# Patient Record
Sex: Female | Born: 1975 | Race: White | Hispanic: No | Marital: Single | Smoking: Current every day smoker
Health system: Southern US, Community
[De-identification: ages and names within clinical notes are randomized; demographics above are authoritative.]

## PROBLEM LIST (undated history)

## (undated) DIAGNOSIS — N186 End stage renal disease: Secondary | ICD-10-CM

## (undated) DIAGNOSIS — C539 Malignant neoplasm of cervix uteri, unspecified: Secondary | ICD-10-CM

## (undated) DIAGNOSIS — I050 Rheumatic mitral stenosis: Secondary | ICD-10-CM

## (undated) DIAGNOSIS — T8859XA Other complications of anesthesia, initial encounter: Secondary | ICD-10-CM

## (undated) DIAGNOSIS — C801 Malignant (primary) neoplasm, unspecified: Secondary | ICD-10-CM

## (undated) DIAGNOSIS — F419 Anxiety disorder, unspecified: Secondary | ICD-10-CM

## (undated) DIAGNOSIS — R0600 Dyspnea, unspecified: Secondary | ICD-10-CM

## (undated) DIAGNOSIS — I10 Essential (primary) hypertension: Secondary | ICD-10-CM

## (undated) DIAGNOSIS — T401X1A Poisoning by heroin, accidental (unintentional), initial encounter: Secondary | ICD-10-CM

## (undated) DIAGNOSIS — D649 Anemia, unspecified: Secondary | ICD-10-CM

## (undated) DIAGNOSIS — F319 Bipolar disorder, unspecified: Secondary | ICD-10-CM

## (undated) DIAGNOSIS — E119 Type 2 diabetes mellitus without complications: Secondary | ICD-10-CM

## (undated) DIAGNOSIS — Z87442 Personal history of urinary calculi: Secondary | ICD-10-CM

## (undated) DIAGNOSIS — R569 Unspecified convulsions: Secondary | ICD-10-CM

## (undated) DIAGNOSIS — A4902 Methicillin resistant Staphylococcus aureus infection, unspecified site: Secondary | ICD-10-CM

## (undated) DIAGNOSIS — J449 Chronic obstructive pulmonary disease, unspecified: Secondary | ICD-10-CM

## (undated) DIAGNOSIS — I82439 Acute embolism and thrombosis of unspecified popliteal vein: Secondary | ICD-10-CM

## (undated) DIAGNOSIS — F32A Depression, unspecified: Secondary | ICD-10-CM

## (undated) DIAGNOSIS — I639 Cerebral infarction, unspecified: Secondary | ICD-10-CM

## (undated) DIAGNOSIS — R197 Diarrhea, unspecified: Secondary | ICD-10-CM

## (undated) DIAGNOSIS — F17200 Nicotine dependence, unspecified, uncomplicated: Secondary | ICD-10-CM

## (undated) DIAGNOSIS — J189 Pneumonia, unspecified organism: Secondary | ICD-10-CM

## (undated) DIAGNOSIS — K219 Gastro-esophageal reflux disease without esophagitis: Secondary | ICD-10-CM

## (undated) DIAGNOSIS — F1410 Cocaine abuse, uncomplicated: Secondary | ICD-10-CM

## (undated) DIAGNOSIS — I82432 Acute embolism and thrombosis of left popliteal vein: Secondary | ICD-10-CM

## (undated) DIAGNOSIS — I5042 Chronic combined systolic (congestive) and diastolic (congestive) heart failure: Secondary | ICD-10-CM

## (undated) HISTORY — PX: TUBAL LIGATION: SHX77

## (undated) HISTORY — PX: ABDOMINAL HYSTERECTOMY: SHX81

## (undated) HISTORY — DX: Nicotine dependence, unspecified, uncomplicated: F17.200

## (undated) HISTORY — PX: FRACTURE SURGERY: SHX138

## (undated) HISTORY — PX: ANKLE FRACTURE SURGERY: SHX122

---
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
## 2001-01-02 ENCOUNTER — Encounter: Admission: RE | Admit: 2001-01-02 | Discharge: 2001-01-02 | Payer: Self-pay

## 2001-11-08 ENCOUNTER — Encounter: Admission: RE | Admit: 2001-11-08 | Discharge: 2001-11-08 | Payer: Self-pay | Admitting: Otolaryngology

## 2001-11-08 ENCOUNTER — Encounter: Payer: Self-pay | Admitting: Otolaryngology

## 2003-10-25 HISTORY — PX: CHOLECYSTECTOMY: SHX55

## 2004-02-20 ENCOUNTER — Inpatient Hospital Stay (HOSPITAL_COMMUNITY): Admission: RE | Admit: 2004-02-20 | Discharge: 2004-02-25 | Payer: Self-pay | Admitting: Psychiatry

## 2004-07-30 ENCOUNTER — Encounter: Admission: RE | Admit: 2004-07-30 | Discharge: 2004-07-30 | Payer: Self-pay | Admitting: Family Medicine

## 2007-07-24 ENCOUNTER — Emergency Department (HOSPITAL_COMMUNITY): Admission: EM | Admit: 2007-07-24 | Discharge: 2007-07-24 | Payer: Self-pay | Admitting: Emergency Medicine

## 2011-03-11 NOTE — Discharge Summary (Signed)
[name], [name]                         ACCOUNT NO.:  [id number]   MEDICAL RECORD NO.:  [id number]                   PATIENT TYPE:  IPS   LOCATION:  0500                                 FACILITY:  [facility]   PHYSICIAN:  Carlton Adam, M.D.                   DATE OF BIRTH:  [birth year]   DATE OF ADMISSION:  02/20/2004  DATE OF DISCHARGE:  02/25/2004                                 DISCHARGE SUMMARY   CHIEF COMPLAINT AND PRESENT ILLNESS:  This was the first admission to [place] for this 35 year old single white female.  She was  brought to the emergency department at [facility] by her grandfather.  He found her after she had relapsed using drugs, crack and marijuana.  Because she did not want to suffer the consequences of the relapse, she  overdosed on his Flexeril.  She was given charcoal.  She was admitted to the  ICU.  She was stabilized.  Transferred to [facility].   PAST PSYCHIATRIC HISTORY:  Numerous hospitalizations.  She has been to [facility],  [facility], has been in a halfway house.   PAST MEDICAL HISTORY:  Tension headaches, gastroesophageal reflux.   MEDICATIONS:  Flexeril 5 mg, 1-2 as needed, Effexor XR 75 mg in the morning,  trazodone 50-150 mg at night.   PHYSICAL EXAMINATION:  Performed and failed to show any acute findings.   LABORATORY DATA:  Drug screen positive for cocaine, marijuana, tricyclics.  Blood chemistries within normal limits.   MENTAL STATUS EXAM:  Alert, cooperative female, overweight, well-groomed.  Casually dressed.  Her speech was normal rate, rhythm and tone.  Her mood  was very anxious but appropriate to the situation.  Her affect was  congruent.  Her thought processes were clear, rational and goal-oriented.  She was wanting to be discharged.  Judgment and insight were fair.  She said  that she knew what she needed to do, she just needed to do it.   ADMISSION DIAGNOSES:   AXIS I:  1. Marijuana and cocaine  abuse.  2. Depressive disorder not otherwise specified.   AXIS II:  No diagnosis.   AXIS III:  Gastroesophageal reflux.   AXIS IV:  Moderate.   AXIS V:  Global Assessment of Functioning upon admission 25-30; highest  Global Assessment of Functioning in the last year 55-60.   HOSPITAL COURSE:  She was admitted and started intensive individual and  group psychotherapy.  She was given Ambien for sleep.  She was given Librium  for anxiety or withdrawal.  She was given Effexor 75 mg per day, trazodone  100 mg at night, Prevacid 1 in the morning.  Effexor was increased to 150 mg  in the morning.  She was given some Seroquel 150 mg at night and then it was  increased to 300 mg.  She claimed that she overdosed in part to get sympathy  from the family as she knew they were going to get upset for her having  relapsed.  She was wanting to go back to a halfway house.  Evidenced  increased insight in terms of her substance use and the need to abstain and  the things that she needed to do to maintain long-term abstinence.  On Feb 24, 2004, she had a family session which she was able to share with her  family, her grandparents what happened.  They were supportive.  On Feb 25, 2004, she said she was feeling much better.  Recognized that she had to work  hard on her recovery.  She knew that she had to wait 30 days before going  back to the halfway house and she was willing to do it.  Upon discharge, in  full contact with reality.  No suicidal ideation.  No homicidal ideation.  No hallucinations.  No delusions.   DISCHARGE DIAGNOSES:   AXIS I:  1. Cocaine and marijuana dependence.  2. Depressive disorder not otherwise specified.   AXIS II:  No diagnosis.   AXIS III:  Gastroesophageal reflux.   AXIS IV:  Moderate.   AXIS V:  Global Assessment of Functioning upon discharge 50.   DISCHARGE MEDICATIONS:  1. Trazodone 100 mg at night.  2. Prevacid 1 daily.  3. Effexor XR 150 mg daily.  4.  Seroquel 300 mg at bedtime.  5. Ambien 10 mg at bedtime for sleep.   FOLLOW UP:  [facility]                                               Carlton Adam, M.D.    IL/MEDQ  D:  03/18/2004  T:  03/19/2004  Job:  [id number]

## 2011-03-11 NOTE — H&P (Signed)
[name], [name]                         ACCOUNT NO.:  [id number]   MEDICAL RECORD NO.:  [id number]                   PATIENT TYPE:  IPS   LOCATION:  0500                                 FACILITY:  [facility]   PHYSICIAN:  Rulon Eisenmenger, M.D.              DATE OF BIRTH:  06/07/1976   DATE OF ADMISSION:  02/20/2004  DATE OF DISCHARGE:                         PSYCHIATRIC ADMISSION ASSESSMENT   IDENTIFYING INFORMATION:  This is a voluntary admission.  This is a 35-year-  old single white female.  The patient was brought to the emergency room at  [facility] on February 20, 2004 by her grandfather.  He found her after  she had relapsed using drugs, crack and marijuana.  She had relapsed on  Tuesday.  He found her at some point on Friday and, because she did not want  to suffer consequences, she overdosed on his Flexeril.  In the emergency  room, she was given charcoal.  She was admitted to the ICU where she was  given fluids and stabilized.  Specifically, her EKG was normal and she was  assessed and arrangements made for transfer to the [facility].   PAST PSYCHIATRIC HISTORY:  The patient first entered into psychiatric care  approximately in 1990, when she cut her wrist at age 89, she says for  attention.  She has had numerous hospitalizations as well as been in  treatment programs minimally 7-8 times.  She notes that she has been to [facility],  [facility].  She had just finished a halfway house in outpatient  treatment.  She had begun the day/night program at mental health in  [place].  This is an intensive outpatient program.  She last attended on  Tuesday prior to relapsing with another participant of the program.   SOCIAL HISTORY:  She has a GED.  She is not working.  She states that she  has never had to.  Her grandfather bought the house that she lives in, pays  her bills and gives her money.   FAMILY HISTORY:  Her mother is reported to do drugs.   The patient notes that  she began using marijuana at age 19, cocaine at age 48.   PRIMARY CARE PHYSICIAN:  Dr. Tobie Lords.   MEDICAL HISTORY:  Dr. Tobie Lords follows her for tension headaches.  She is also  thought to suffer from GERD and insomnia.  She is status post  cholecystectomy in May of 2003.   MEDICATIONS:  Dr. Tobie Lords prescribes Flexeril for this, 5 mg.  She takes 1-2  as needed.  She is currently prescribed Effexor 75 mg p.o. q.a.m. and  trazodone 50-150 mg q.h.s.   PHYSICAL EXAMINATION:  Her physical examination is as per the documentation  that accompanied her from [facility].  It was not repeated today.   MENTAL STATUS EXAM:  She is alert and oriented x 3.  She is obese.  She is  well-groomed.  She is casually dressed.  Her behavior is cooperative.  Her  speech has a normal rate, rhythm and tone.  Her mood is slightly anxious but  appropriate to the situation.  Her affect is congruent.  Her thought  processes are clear, rational and goal-oriented.  She would like to be  discharged.  Judgment and insight are fair.  She knows what she needs to do;  it is just a matter of doing it.  Concentration and memory are intact.  Intelligence is at least average.   DIAGNOSES:   AXIS I:  1. Depression.  2. Insomnia.  3. Substance abuse (crack and THC).   AXIS II:  Deferred.   AXIS III:  1. Gastroesophageal reflux disease.  2. Status post cholecystectomy.  3. Obesity.   AXIS IV:  Severe (substance abuse).   AXIS V:  20.   PLAN:  Continue stabilization.  Optimize her antidepressant medications.  Re-  establish support for continued drug treatment once discharged.     Elliot Dally, P.A.-C.               Rulon Eisenmenger, M.D.    MD/MEDQ  D:  02/21/2004  T:  02/21/2004  Job:  [id number]

## 2011-08-04 LAB — CBC
HCT: 39.5
Hemoglobin: 13.8
MCHC: 34.9
MCV: 93.9
Platelets: 266
RBC: 4.21
RDW: 12.3
WBC: 7.5

## 2011-08-04 LAB — COMPREHENSIVE METABOLIC PANEL
ALT: 14
AST: 15
Albumin: 3.5
Alkaline Phosphatase: 36 — ABNORMAL LOW
BUN: 11
CO2: 22
Calcium: 8.9
Chloride: 108
Creatinine, Ser: 0.75
GFR calc Af Amer: >60
GFR calc non Af Amer: >60
Glucose, Bld: 103 — ABNORMAL HIGH
Potassium: 3.6
Sodium: 138
Total Bilirubin: 0.6
Total Protein: 6.1

## 2011-08-04 LAB — DIFFERENTIAL
Basophils Absolute: 0
Basophils Relative: 0
Eosinophils Absolute: 0.1
Eosinophils Relative: 1
Lymphocytes Relative: 29
Lymphs Abs: 2.2
Monocytes Absolute: 0.6
Monocytes Relative: 8
Neutro Abs: 4.6
Neutrophils Relative %: 62

## 2011-08-04 LAB — RAPID URINE DRUG SCREEN, HOSP PERFORMED
Amphetamines: NOT DETECTED
Barbiturates: NOT DETECTED
Benzodiazepines: POSITIVE — AB
Cocaine: NOT DETECTED
Opiates: NOT DETECTED
Tetrahydrocannabinol: POSITIVE — AB

## 2011-08-04 LAB — ETHANOL: Alcohol, Ethyl (B): <5

## 2014-01-27 ENCOUNTER — Emergency Department (HOSPITAL_COMMUNITY): Payer: Medicaid Other

## 2014-01-27 ENCOUNTER — Emergency Department (HOSPITAL_COMMUNITY)
Admission: EM | Admit: 2014-01-27 | Discharge: 2014-01-27 | Disposition: A | Discharge status: Home / Self Care | Payer: Medicaid Other | Attending: Emergency Medicine | Admitting: Emergency Medicine

## 2014-01-27 ENCOUNTER — Encounter (HOSPITAL_COMMUNITY): Payer: Self-pay | Admitting: Emergency Medicine

## 2014-01-27 DIAGNOSIS — R079 Chest pain, unspecified: Secondary | ICD-10-CM | POA: Diagnosis present

## 2014-01-27 DIAGNOSIS — R112 Nausea with vomiting, unspecified: Secondary | ICD-10-CM | POA: Diagnosis not present

## 2014-01-27 DIAGNOSIS — R739 Hyperglycemia, unspecified: Secondary | ICD-10-CM

## 2014-01-27 DIAGNOSIS — R000 Tachycardia, unspecified: Secondary | ICD-10-CM | POA: Insufficient documentation

## 2014-01-27 DIAGNOSIS — R05 Cough: Secondary | ICD-10-CM | POA: Insufficient documentation

## 2014-01-27 DIAGNOSIS — R61 Generalized hyperhidrosis: Secondary | ICD-10-CM | POA: Insufficient documentation

## 2014-01-27 DIAGNOSIS — R072 Precordial pain: Secondary | ICD-10-CM | POA: Diagnosis not present

## 2014-01-27 DIAGNOSIS — E860 Dehydration: Secondary | ICD-10-CM | POA: Diagnosis not present

## 2014-01-27 DIAGNOSIS — F1210 Cannabis abuse, uncomplicated: Secondary | ICD-10-CM | POA: Insufficient documentation

## 2014-01-27 DIAGNOSIS — R1013 Epigastric pain: Secondary | ICD-10-CM | POA: Diagnosis not present

## 2014-01-27 DIAGNOSIS — K2990 Gastroduodenitis, unspecified, without bleeding: Secondary | ICD-10-CM | POA: Diagnosis not present

## 2014-01-27 DIAGNOSIS — Z3202 Encounter for pregnancy test, result negative: Secondary | ICD-10-CM | POA: Insufficient documentation

## 2014-01-27 DIAGNOSIS — R059 Cough, unspecified: Secondary | ICD-10-CM | POA: Insufficient documentation

## 2014-01-27 DIAGNOSIS — F1410 Cocaine abuse, uncomplicated: Secondary | ICD-10-CM | POA: Insufficient documentation

## 2014-01-27 DIAGNOSIS — R7309 Other abnormal glucose: Secondary | ICD-10-CM | POA: Diagnosis not present

## 2014-01-27 DIAGNOSIS — R0602 Shortness of breath: Secondary | ICD-10-CM | POA: Diagnosis not present

## 2014-01-27 DIAGNOSIS — K2970 Gastritis, unspecified, without bleeding: Secondary | ICD-10-CM | POA: Diagnosis not present

## 2014-01-27 HISTORY — DX: Type 2 diabetes mellitus without complications: E11.9

## 2014-01-27 HISTORY — DX: Essential (primary) hypertension: I10

## 2014-01-27 HISTORY — DX: Unspecified convulsions: R56.9

## 2014-01-27 LAB — I-STAT VENOUS BLOOD GAS, ED
Acid-base deficit: 5 mmol/L — ABNORMAL HIGH (ref 0.0–2.0)
Bicarbonate: 20.7 mEq/L (ref 20.0–24.0)
O2 Saturation: 31 %
TCO2: 22 mmol/L (ref 0–100)
pCO2, Ven: 39 mmHg — ABNORMAL LOW (ref 45.0–50.0)
pH, Ven: 7.333 — ABNORMAL HIGH (ref 7.250–7.300)
pO2, Ven: 21 mmHg — CL (ref 30.0–45.0)

## 2014-01-27 LAB — URINALYSIS, ROUTINE W REFLEX MICROSCOPIC
Bilirubin Urine: NEGATIVE
Glucose, UA: >1000 mg/dL — AB
Hgb urine dipstick: NEGATIVE
Ketones, ur: >80 mg/dL — AB
Leukocytes, UA: NEGATIVE
Nitrite: NEGATIVE
Protein, ur: NEGATIVE mg/dL
Specific Gravity, Urine: 1.037 — ABNORMAL HIGH (ref 1.005–1.030)
Urobilinogen, UA: 0.2 mg/dL (ref 0.0–1.0)
pH: 5.5 (ref 5.0–8.0)

## 2014-01-27 LAB — COMPREHENSIVE METABOLIC PANEL
ALT: 8 U/L (ref 0–35)
AST: 9 U/L (ref 0–37)
Albumin: 3.3 g/dL — ABNORMAL LOW (ref 3.5–5.2)
Alkaline Phosphatase: 83 U/L (ref 39–117)
BUN: 12 mg/dL (ref 6–23)
CO2: 16 mEq/L — ABNORMAL LOW (ref 19–32)
Calcium: 8.8 mg/dL (ref 8.4–10.5)
Chloride: 97 mEq/L (ref 96–112)
Creatinine, Ser: 0.55 mg/dL (ref 0.50–1.10)
GFR calc Af Amer: >90 mL/min (ref >90)
GFR calc non Af Amer: >90 mL/min (ref >90)
Glucose, Bld: 382 mg/dL — ABNORMAL HIGH (ref 70–99)
Potassium: 3.9 mEq/L (ref 3.7–5.3)
Sodium: 133 mEq/L — ABNORMAL LOW (ref 137–147)
Total Bilirubin: 0.2 mg/dL — ABNORMAL LOW (ref 0.3–1.2)
Total Protein: 7 g/dL (ref 6.0–8.3)

## 2014-01-27 LAB — CBC WITH DIFFERENTIAL/PLATELET
Basophils Absolute: 0 10*3/uL (ref 0.0–0.1)
Basophils Relative: 0 % (ref 0–1)
Eosinophils Absolute: 0.1 10*3/uL (ref 0.0–0.7)
Eosinophils Relative: 2 % (ref 0–5)
HCT: 42 % (ref 36.0–46.0)
Hemoglobin: 15.5 g/dL — ABNORMAL HIGH (ref 12.0–15.0)
Lymphocytes Relative: 31 % (ref 12–46)
Lymphs Abs: 2.4 10*3/uL (ref 0.7–4.0)
MCH: 32.9 pg (ref 26.0–34.0)
MCHC: 36.9 g/dL — ABNORMAL HIGH (ref 30.0–36.0)
MCV: 89.2 fL (ref 78.0–100.0)
Monocytes Absolute: 0.8 10*3/uL (ref 0.1–1.0)
Monocytes Relative: 10 % (ref 3–12)
Neutro Abs: 4.5 10*3/uL (ref 1.7–7.7)
Neutrophils Relative %: 58 % (ref 43–77)
Platelets: 320 10*3/uL (ref 150–400)
RBC: 4.71 MIL/uL (ref 3.87–5.11)
RDW: 12 % (ref 11.5–15.5)
WBC: 7.9 10*3/uL (ref 4.0–10.5)

## 2014-01-27 LAB — I-STAT TROPONIN, ED: Troponin i, poc: 0 ng/mL (ref 0.00–0.08)

## 2014-01-27 LAB — RAPID URINE DRUG SCREEN, HOSP PERFORMED
Amphetamines: NOT DETECTED
Barbiturates: NOT DETECTED
Benzodiazepines: NOT DETECTED
Cocaine: POSITIVE — AB
Opiates: NOT DETECTED
Tetrahydrocannabinol: POSITIVE — AB

## 2014-01-27 LAB — URINE MICROSCOPIC-ADD ON

## 2014-01-27 LAB — CK: Total CK: 28 U/L (ref 7–177)

## 2014-01-27 LAB — ETHANOL: Alcohol, Ethyl (B): <11 mg/dL (ref 0–11)

## 2014-01-27 LAB — LIPASE, BLOOD: Lipase: 56 U/L (ref 11–59)

## 2014-01-27 LAB — PREGNANCY, URINE: Preg Test, Ur: NEGATIVE

## 2014-01-27 LAB — CBG MONITORING, ED: Glucose-Capillary: 344 mg/dL — ABNORMAL HIGH (ref 70–99)

## 2014-01-27 MED ORDER — RANITIDINE HCL 150 MG PO TABS: 150.0000 mg | ORAL_TABLET | Freq: Two times a day (BID) | ORAL | Status: DC

## 2014-01-27 MED ORDER — SODIUM CHLORIDE 0.9 % IV BOLUS (SEPSIS)
1000.0000 mL | Freq: Once | INTRAVENOUS | Status: AC
  Administered 2014-01-27: 1000 mL via INTRAVENOUS

## 2014-01-27 MED ORDER — ONDANSETRON HCL 4 MG/2ML IJ SOLN
4.0000 mg | Freq: Once | INTRAMUSCULAR | Status: AC
  Administered 2014-01-27: 4 mg via INTRAVENOUS
  Filled 2014-01-27: qty 2

## 2014-01-27 MED ORDER — GI COCKTAIL ~~LOC~~
30.0000 mL | Freq: Once | ORAL | Status: AC
  Administered 2014-01-27: 30 mL via ORAL
  Filled 2014-01-27: qty 30

## 2014-01-27 MED ORDER — ONDANSETRON HCL 4 MG PO TABS: 4.0000 mg | ORAL_TABLET | Freq: Three times a day (TID) | ORAL | Status: DC | PRN

## 2014-01-27 MED ORDER — LORAZEPAM 2 MG/ML IJ SOLN
1.0000 mg | Freq: Once | INTRAMUSCULAR | Status: AC
  Administered 2014-01-27: 1 mg via INTRAVENOUS
  Filled 2014-01-27: qty 1

## 2014-01-27 NOTE — Discharge Instructions (Signed)
Chemical Dependency Chemical dependency is an addiction to drugs or alcohol. It is characterized by the repeated behavior of seeking out and using drugs and alcohol despite harmful consequences to the health and safety of ones self and others.  RISK FACTORS There are certain situations or behaviors that increase a person's risk for chemical dependency. These include:  A family history of chemical dependency.  A history of mental health issues, including depression and anxiety.  A home environment where drugs and alcohol are easily available to you.  Drug or alcohol use at a young age. SYMPTOMS  The following symptoms can indicate chemical dependency:  Inability to limit the use of drugs or alcohol.  Nausea, sweating, shakiness, and anxiety that occurs when alcohol or drugs are not being used.  An increase in amount of drugs or alcohol that is necessary to get drunk or high. People who experience these symptoms can assess their use of drugs and alcohol by asking themselves the following questions:  Have you been told by friends or family that they are worried about your use of alcohol or drugs?  Do friends and family ever tell you about things you did while drinking alcohol or using drugs that you do not remember?  Do you lie about using alcohol or drugs or about the amounts you use?  Do you have difficulty completing daily tasks unless you use alcohol or drugs?  Is the level of your work or school performance lower because of your drug or alcohol use?  Do you get sick from using drugs or alcohol but keep using anyway?  Do you feel uncomfortable in social situations unless you use alcohol or drugs?  Do you use drugs or alcohol to help forget problems? An answer of yes to any of these questions may indicate chemical dependency. Professional evaluation is suggested. Document Released: 10/04/2001 Document Revised: 01/02/2012 Document Reviewed: 12/16/2010 [facility] Patient  Information 2014 [place], [place].  Chest Pain (Nonspecific) Chest pain has many causes. Your pain could be caused by something serious, such as a heart attack or a blood clot in the lungs. It could also be caused by something less serious, such as a chest bruise or a virus. Follow up with your doctor. More lab tests or other studies may be needed to find the cause of your pain. Most of the time, nonspecific chest pain will improve within 2 to 3 days of rest and mild pain medicine. HOME CARE  For chest bruises, you may put ice on the sore area for 15-20 minutes, 03-04 times a day. Do this only if it makes you feel better.  Put ice in a plastic bag.  Place a towel between the skin and the bag.  Rest for the next 2 to 3 days.  Go back to work if the pain improves.  See your doctor if the pain lasts longer than 1 to 2 weeks.  Only take medicine as told by your doctor.  Quit smoking if you smoke. GET HELP RIGHT AWAY IF:   There is more pain or pain that spreads to the arm, neck, jaw, back, or belly (abdomen).  You have shortness of breath.  You cough more than usual or cough up blood.  You have very bad back or belly pain, feel sick to your stomach (nauseous), or throw up (vomit).  You have very bad weakness.  You pass out (faint).  You have a fever. Any of these problems may be serious and may be an emergency. Do  not wait to see if the problems will go away. Get medical help right away. Call your local emergency services 911 in [place] Do not drive yourself to the hospital. MAKE SURE YOU:   Understand these instructions.  Will watch this condition.  Will get help right away if you or your child is not doing well or gets worse. Document Released: 03/28/2008 Document Revised: 01/02/2012 Document Reviewed: 03/28/2008 [facility] Patient Information 2014 [place], [place].

## 2014-01-27 NOTE — ED Provider Notes (Addendum)
CSN: [id number]     Arrival date & time 01/27/14  1633 History   First MD Initiated Contact with Patient 01/27/14 1633     No chief complaint on file.    (Consider location/radiation/quality/duration/timing/severity/associated sxs/prior Treatment) Patient is a 38 y.o. female presenting with chest pain and shortness of breath. The history is provided by the patient.  Chest Pain Pain location:  Substernal area and epigastric Pain quality: aching, burning and hot   Pain radiates to:  L shoulder Pain radiates to the back: no   Pain severity:  Moderate Onset quality:  Gradual Duration:  12 hours Timing:  Constant Progression:  Unchanged Chronicity:  New Context: drug use   Context comment:  Started after using a large amt of cocaine and alcohol yesterday evening Relieved by:  Nothing Worsened by:  Deep breathing, movement and smoking Ineffective treatments: tried to drink some water but could not hold anything down. Associated symptoms: cough, diaphoresis, nausea, shortness of breath and vomiting   Associated symptoms: no back pain, no fever and no palpitations   Risk factors: diabetes mellitus and smoking   Risk factors: no coronary artery disease   Shortness of Breath Associated symptoms: chest pain, cough, diaphoresis and vomiting   Associated symptoms: no fever     No past medical history on file. No past surgical history on file. No family history on file. History  Substance Use Topics  . Smoking status: Not on file  . Smokeless tobacco: Not on file  . Alcohol Use: Not on file   OB History   No data available     Review of Systems  Constitutional: Positive for diaphoresis. Negative for fever.  Respiratory: Positive for cough and shortness of breath.   Cardiovascular: Positive for chest pain. Negative for palpitations.  Gastrointestinal: Positive for nausea and vomiting.  Musculoskeletal: Negative for back pain.  All other systems reviewed and are  negative.      Allergies  Review of patient's allergies indicates not on file.  Home Medications  No current outpatient prescriptions on file. There were no vitals taken for this visit. Physical Exam  Nursing note and vitals reviewed. Constitutional: She is oriented to person, place, and time. She appears well-developed and well-nourished. No distress.  HENT:  Head: Normocephalic and atraumatic.  Mouth/Throat: Oropharynx is clear and moist.  Eyes: Conjunctivae and EOM are normal. Pupils are equal, round, and reactive to light.  Neck: Normal range of motion. Neck supple.  Cardiovascular: Regular rhythm and intact distal pulses.  Tachycardia present.   No murmur heard. Pulmonary/Chest: Effort normal and breath sounds normal. No respiratory distress. She has no wheezes. She has no rales. She exhibits tenderness.  Abdominal: Soft. Normal appearance. She exhibits no distension. There is tenderness in the epigastric area. There is no rebound and no guarding.    Musculoskeletal: Normal range of motion. She exhibits no edema and no tenderness.  Neurological: She is alert and oriented to person, place, and time.  Skin: Skin is warm and dry. No rash noted. No erythema.  Psychiatric: She has a normal mood and affect. Her behavior is normal.    ED Course  Procedures (including critical care time) Labs Review Labs Reviewed  CBC WITH DIFFERENTIAL - Abnormal; Notable for the following:    Hemoglobin 15.5 (*)    MCHC 36.9 (*)    All other components within normal limits  COMPREHENSIVE METABOLIC PANEL - Abnormal; Notable for the following:    Sodium 133 (*)    CO2  16 (*)    Glucose, Bld 382 (*)    Albumin 3.3 (*)    Total Bilirubin 0.2 (*)    All other components within normal limits  URINALYSIS, ROUTINE W REFLEX MICROSCOPIC - Abnormal; Notable for the following:    Specific Gravity, Urine 1.037 (*)    Glucose, UA >1000 (*)    Ketones, ur >80 (*)    All other components within  normal limits  URINE RAPID DRUG SCREEN (HOSP PERFORMED) - Abnormal; Notable for the following:    Cocaine POSITIVE (*)    Tetrahydrocannabinol POSITIVE (*)    All other components within normal limits  I-STAT VENOUS BLOOD GAS, ED - Abnormal; Notable for the following:    pH, Ven 7.333 (*)    pCO2, Ven 39.0 (*)    pO2, Ven 21.0 (*)    Acid-base deficit 5.0 (*)    All other components within normal limits  LIPASE, BLOOD  CK  ETHANOL  PREGNANCY, URINE  URINE MICROSCOPIC-ADD ON  Randolm Idol, ED   Imaging Review Dg Chest 2 View  01/27/2014   CLINICAL DATA:  Chest pain.  Short of breath.  EXAM: CHEST  2 VIEW  COMPARISON:  05/17/2013  FINDINGS: The heart size and mediastinal contours are within normal limits. Both lungs are clear. The visualized skeletal structures are unremarkable.  IMPRESSION: No active cardiopulmonary disease.   Electronically Signed   By: Lajean Manes M.D.   On: 01/27/2014 18:58     EKG Interpretation   Date/Time:  Monday January 27 2014 16:42:31 EDT Ventricular Rate:  121 PR Interval:  115 QRS Duration: 88 QT Interval:  311 QTC Calculation: 441 R Axis:   97 Text Interpretation:  Sinus tachycardia Consider right atrial enlargement  Borderline right axis deviation ST elev, probable normal early repol  pattern No previous tracing Confirmed by Maryan Rued  MD, Loree Fee ([id number]) on  01/27/2014 4:51:30 PM      MDM   Final diagnoses:  Cocaine abuse  Gastritis  Chest pain  Hyperglycemia  Dehydration    Patient had a significant history upper abdominal and chest pain that started yesterday and has been ongoing for approximately 12 hours with nausea and vomiting. Significantly for the last 3 days she's been using a large amount of cocaine and alcohol. She is a chronic cocaine abuser but states she has never had pain like this in the past. She has a history of diabetes but no cardiac history that she is aware of. When EMS initially arrived patient was hypotensive,  diaphoretic but normal mental status. She was received 250 mL of fluid and her blood pressures 140s over 80s she is tachycardic around 110-115.    Patient took 1 mg of Ativan approximately 2-3 hours ago without improvement in symptoms. On exam here patient is awake and alert and able to answer all questions. Mild epigastric pain but no other reproducible pain. She denies pain radiating to the back and normal pulses in all extremities. Low suspicion for dissection at this time however concern for possible MI versus abdominal pathology such as gastritis, pancreatitis or acute alcoholic hepatitis vs pulm pathology as pt does have SOB and cough.  No lower abd pain and denies urinary or vag sx.  CBC, CMP, lipase, UA, CK, EtOH come EUDS, UPT, troponin, chest x-ray pending. Patient given IV fluids she has not eaten for the last 3 days, Ativan, Zofran and a GI cocktail. EKG shows no sign of acute MI  7:13 PM All labs related  to cardiac wnl.  CXR wnl.  Positive for cocaine and marijuana and ETOH neg.  Pt does have AG of 20 VBG and second liter of fluid ordered.  Pt improved after fluids and meds.  7:38 PM VBG without signs of DKA.  Will po challenge and recheck BS.  Repeat BS 344 and pt tolerating po's and feeling better.  Will d/ch ome with family.  Remains slightly tachy but states feels much better.  Blanchie Dessert, MD 01/27/14 [id number]  Blanchie Dessert, MD 01/27/14 [id number]

## 2014-01-27 NOTE — ED Notes (Signed)
Pt. Admitted via [facility] with bilateral chest pain 8/10 radiating towards the back. Pt. Admits to taking $1000 worth of Cocaine. Pt. 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 EMS stated EKG normal sinus with HR 146, Manual B/P 138/76, CBG 457. 441mL fluid bolus was given per EMS. Pt. Currently taking Haldol, Elavil, Depakote, and Metoprolol

## 2014-01-27 NOTE — ED Notes (Signed)
Pt given PO challenge- sprite and saltine.   Family member at bedside given snacks per request.

## 2014-01-27 NOTE — ED Notes (Signed)
Phlebotomy attempt x1 without success. Phlebotomy called.

## 2014-03-20 DIAGNOSIS — F1220 Cannabis dependence, uncomplicated: Secondary | ICD-10-CM | POA: Insufficient documentation

## 2014-03-20 DIAGNOSIS — F4310 Post-traumatic stress disorder, unspecified: Secondary | ICD-10-CM | POA: Insufficient documentation

## 2014-04-20 ENCOUNTER — Encounter (HOSPITAL_COMMUNITY): Payer: Self-pay | Admitting: Emergency Medicine

## 2014-04-20 ENCOUNTER — Emergency Department (HOSPITAL_COMMUNITY): Payer: Medicaid Other

## 2014-04-20 ENCOUNTER — Inpatient Hospital Stay (HOSPITAL_COMMUNITY)
Admission: EM | Admit: 2014-04-20 | Discharge: 2014-04-28 | DRG: 557 | Disposition: A | Discharge status: Rehabilitation Facility | Payer: Medicaid Other | Attending: Internal Medicine | Admitting: Internal Medicine

## 2014-04-20 ENCOUNTER — Inpatient Hospital Stay (HOSPITAL_COMMUNITY): Payer: Medicaid Other

## 2014-04-20 DIAGNOSIS — G8220 Paraplegia, unspecified: Secondary | ICD-10-CM | POA: Diagnosis present

## 2014-04-20 DIAGNOSIS — E1142 Type 2 diabetes mellitus with diabetic polyneuropathy: Secondary | ICD-10-CM | POA: Diagnosis present

## 2014-04-20 DIAGNOSIS — E118 Type 2 diabetes mellitus with unspecified complications: Secondary | ICD-10-CM

## 2014-04-20 DIAGNOSIS — F17200 Nicotine dependence, unspecified, uncomplicated: Secondary | ICD-10-CM | POA: Diagnosis present

## 2014-04-20 DIAGNOSIS — I509 Heart failure, unspecified: Secondary | ICD-10-CM | POA: Diagnosis present

## 2014-04-20 DIAGNOSIS — Q619 Cystic kidney disease, unspecified: Secondary | ICD-10-CM

## 2014-04-20 DIAGNOSIS — E1149 Type 2 diabetes mellitus with other diabetic neurological complication: Secondary | ICD-10-CM | POA: Diagnosis present

## 2014-04-20 DIAGNOSIS — R404 Transient alteration of awareness: Secondary | ICD-10-CM

## 2014-04-20 DIAGNOSIS — Z79899 Other long term (current) drug therapy: Secondary | ICD-10-CM

## 2014-04-20 DIAGNOSIS — I5042 Chronic combined systolic (congestive) and diastolic (congestive) heart failure: Secondary | ICD-10-CM | POA: Diagnosis present

## 2014-04-20 DIAGNOSIS — E871 Hypo-osmolality and hyponatremia: Secondary | ICD-10-CM | POA: Diagnosis present

## 2014-04-20 DIAGNOSIS — E872 Acidosis, unspecified: Secondary | ICD-10-CM

## 2014-04-20 DIAGNOSIS — J320 Chronic maxillary sinusitis: Secondary | ICD-10-CM | POA: Diagnosis present

## 2014-04-20 DIAGNOSIS — I10 Essential (primary) hypertension: Secondary | ICD-10-CM | POA: Diagnosis present

## 2014-04-20 DIAGNOSIS — Z6841 Body Mass Index (BMI) 40.0 and over, adult: Secondary | ICD-10-CM

## 2014-04-20 DIAGNOSIS — J189 Pneumonia, unspecified organism: Secondary | ICD-10-CM | POA: Diagnosis present

## 2014-04-20 DIAGNOSIS — F1010 Alcohol abuse, uncomplicated: Secondary | ICD-10-CM | POA: Diagnosis present

## 2014-04-20 DIAGNOSIS — T796XXA Traumatic ischemia of muscle, initial encounter: Secondary | ICD-10-CM

## 2014-04-20 DIAGNOSIS — Z794 Long term (current) use of insulin: Secondary | ICD-10-CM | POA: Diagnosis not present

## 2014-04-20 DIAGNOSIS — M609 Myositis, unspecified: Secondary | ICD-10-CM | POA: Diagnosis present

## 2014-04-20 DIAGNOSIS — R339 Retention of urine, unspecified: Secondary | ICD-10-CM | POA: Diagnosis present

## 2014-04-20 DIAGNOSIS — I5022 Chronic systolic (congestive) heart failure: Secondary | ICD-10-CM

## 2014-04-20 DIAGNOSIS — L678 Other hair color and hair shaft abnormalities: Secondary | ICD-10-CM | POA: Diagnosis present

## 2014-04-20 DIAGNOSIS — R7989 Other specified abnormal findings of blood chemistry: Secondary | ICD-10-CM | POA: Diagnosis present

## 2014-04-20 DIAGNOSIS — R630 Anorexia: Secondary | ICD-10-CM | POA: Diagnosis present

## 2014-04-20 DIAGNOSIS — J322 Chronic ethmoidal sinusitis: Secondary | ICD-10-CM | POA: Diagnosis present

## 2014-04-20 DIAGNOSIS — G5730 Lesion of lateral popliteal nerve, unspecified lower limb: Secondary | ICD-10-CM | POA: Diagnosis present

## 2014-04-20 DIAGNOSIS — R29898 Other symptoms and signs involving the musculoskeletal system: Secondary | ICD-10-CM

## 2014-04-20 DIAGNOSIS — E785 Hyperlipidemia, unspecified: Secondary | ICD-10-CM | POA: Diagnosis present

## 2014-04-20 DIAGNOSIS — F445 Conversion disorder with seizures or convulsions: Secondary | ICD-10-CM | POA: Diagnosis present

## 2014-04-20 DIAGNOSIS — F1410 Cocaine abuse, uncomplicated: Secondary | ICD-10-CM | POA: Diagnosis present

## 2014-04-20 DIAGNOSIS — E119 Type 2 diabetes mellitus without complications: Secondary | ICD-10-CM

## 2014-04-20 DIAGNOSIS — Z9119 Patient's noncompliance with other medical treatment and regimen: Secondary | ICD-10-CM | POA: Diagnosis not present

## 2014-04-20 DIAGNOSIS — G92 Toxic encephalopathy: Secondary | ICD-10-CM | POA: Diagnosis present

## 2014-04-20 DIAGNOSIS — Z881 Allergy status to other antibiotic agents status: Secondary | ICD-10-CM

## 2014-04-20 DIAGNOSIS — R4182 Altered mental status, unspecified: Secondary | ICD-10-CM | POA: Diagnosis not present

## 2014-04-20 DIAGNOSIS — E0781 Sick-euthyroid syndrome: Secondary | ICD-10-CM | POA: Diagnosis present

## 2014-04-20 DIAGNOSIS — T796XXD Traumatic ischemia of muscle, subsequent encounter: Secondary | ICD-10-CM

## 2014-04-20 DIAGNOSIS — R569 Unspecified convulsions: Secondary | ICD-10-CM | POA: Diagnosis present

## 2014-04-20 DIAGNOSIS — J984 Other disorders of lung: Secondary | ICD-10-CM

## 2014-04-20 DIAGNOSIS — F319 Bipolar disorder, unspecified: Secondary | ICD-10-CM | POA: Diagnosis present

## 2014-04-20 DIAGNOSIS — Z8249 Family history of ischemic heart disease and other diseases of the circulatory system: Secondary | ICD-10-CM

## 2014-04-20 DIAGNOSIS — J680 Bronchitis and pneumonitis due to chemicals, gases, fumes and vapors: Secondary | ICD-10-CM

## 2014-04-20 DIAGNOSIS — Z91199 Patient's noncompliance with other medical treatment and regimen due to unspecified reason: Secondary | ICD-10-CM | POA: Diagnosis not present

## 2014-04-20 DIAGNOSIS — Z9089 Acquired absence of other organs: Secondary | ICD-10-CM

## 2014-04-20 DIAGNOSIS — G929 Unspecified toxic encephalopathy: Secondary | ICD-10-CM | POA: Diagnosis present

## 2014-04-20 DIAGNOSIS — I498 Other specified cardiac arrhythmias: Secondary | ICD-10-CM | POA: Diagnosis present

## 2014-04-20 DIAGNOSIS — A419 Sepsis, unspecified organism: Secondary | ICD-10-CM

## 2014-04-20 DIAGNOSIS — M6282 Rhabdomyolysis: Secondary | ICD-10-CM | POA: Diagnosis not present

## 2014-04-20 DIAGNOSIS — Z602 Problems related to living alone: Secondary | ICD-10-CM | POA: Diagnosis not present

## 2014-04-20 DIAGNOSIS — T405X1A Poisoning by cocaine, accidental (unintentional), initial encounter: Secondary | ICD-10-CM

## 2014-04-20 DIAGNOSIS — L738 Other specified follicular disorders: Secondary | ICD-10-CM | POA: Diagnosis present

## 2014-04-20 HISTORY — DX: Cocaine abuse, uncomplicated: F14.10

## 2014-04-20 HISTORY — DX: Chronic combined systolic (congestive) and diastolic (congestive) heart failure: I50.42

## 2014-04-20 LAB — CBC WITH DIFFERENTIAL/PLATELET
Basophils Absolute: 0 10*3/uL (ref 0.0–0.1)
Basophils Absolute: 0 10*3/uL (ref 0.0–0.1)
Basophils Relative: 0 % (ref 0–1)
Basophils Relative: 0 % (ref 0–1)
Eosinophils Absolute: 0 10*3/uL (ref 0.0–0.7)
Eosinophils Absolute: 0 10*3/uL (ref 0.0–0.7)
Eosinophils Relative: 0 % (ref 0–5)
Eosinophils Relative: 0 % (ref 0–5)
HCT: 47.9 % — ABNORMAL HIGH (ref 36.0–46.0)
HCT: 41 % (ref 36.0–46.0)
Hemoglobin: 16.9 g/dL — ABNORMAL HIGH (ref 12.0–15.0)
Hemoglobin: 14.4 g/dL (ref 12.0–15.0)
Lymphocytes Relative: 5 % — ABNORMAL LOW (ref 12–46)
Lymphocytes Relative: 10 % — ABNORMAL LOW (ref 12–46)
Lymphs Abs: 0.9 10*3/uL (ref 0.7–4.0)
Lymphs Abs: 1 10*3/uL (ref 0.7–4.0)
MCH: 32.7 pg (ref 26.0–34.0)
MCH: 32.4 pg (ref 26.0–34.0)
MCHC: 35.3 g/dL (ref 30.0–36.0)
MCHC: 35.1 g/dL (ref 30.0–36.0)
MCV: 92.6 fL (ref 78.0–100.0)
MCV: 92.1 fL (ref 78.0–100.0)
Monocytes Absolute: 1.9 10*3/uL — ABNORMAL HIGH (ref 0.1–1.0)
Monocytes Absolute: 1.1 10*3/uL — ABNORMAL HIGH (ref 0.1–1.0)
Monocytes Relative: 11 % (ref 3–12)
Monocytes Relative: 10 % (ref 3–12)
Neutro Abs: 14 10*3/uL — ABNORMAL HIGH (ref 1.7–7.7)
Neutro Abs: 8.4 10*3/uL — ABNORMAL HIGH (ref 1.7–7.7)
Neutrophils Relative %: 84 % — ABNORMAL HIGH (ref 43–77)
Neutrophils Relative %: 80 % — ABNORMAL HIGH (ref 43–77)
Platelets: 386 10*3/uL (ref 150–400)
Platelets: 331 10*3/uL (ref 150–400)
RBC: 5.17 MIL/uL — ABNORMAL HIGH (ref 3.87–5.11)
RBC: 4.45 MIL/uL (ref 3.87–5.11)
RDW: 12.8 % (ref 11.5–15.5)
RDW: 13.2 % (ref 11.5–15.5)
WBC: 16.8 10*3/uL — ABNORMAL HIGH (ref 4.0–10.5)
WBC: 10.5 10*3/uL (ref 4.0–10.5)

## 2014-04-20 LAB — URINE MICROSCOPIC-ADD ON

## 2014-04-20 LAB — ETHANOL: Alcohol, Ethyl (B): <11 mg/dL (ref 0–11)

## 2014-04-20 LAB — I-STAT CHEM 8, ED
BUN: 9 mg/dL (ref 6–23)
Calcium, Ion: 1.02 mmol/L — ABNORMAL LOW (ref 1.12–1.23)
Chloride: 98 mEq/L (ref 96–112)
Creatinine, Ser: 0.7 mg/dL (ref 0.50–1.10)
Glucose, Bld: 389 mg/dL — ABNORMAL HIGH (ref 70–99)
HCT: 53 % — ABNORMAL HIGH (ref 36.0–46.0)
Hemoglobin: 18 g/dL — ABNORMAL HIGH (ref 12.0–15.0)
Potassium: 4.8 mEq/L (ref 3.7–5.3)
Sodium: 135 mEq/L — ABNORMAL LOW (ref 137–147)
TCO2: 19 mmol/L (ref 0–100)

## 2014-04-20 LAB — URINALYSIS, ROUTINE W REFLEX MICROSCOPIC
Bilirubin Urine: NEGATIVE
Glucose, UA: >1000 mg/dL — AB
Ketones, ur: 40 mg/dL — AB
Leukocytes, UA: NEGATIVE
Nitrite: NEGATIVE
Protein, ur: 30 mg/dL — AB
Specific Gravity, Urine: 1.028 (ref 1.005–1.030)
Urobilinogen, UA: 0.2 mg/dL (ref 0.0–1.0)
pH: 6 (ref 5.0–8.0)

## 2014-04-20 LAB — PRO B NATRIURETIC PEPTIDE: Pro B Natriuretic peptide (BNP): 4466 pg/mL — ABNORMAL HIGH (ref 0–125)

## 2014-04-20 LAB — COMPREHENSIVE METABOLIC PANEL
ALT: 75 U/L — ABNORMAL HIGH (ref 0–35)
ALT: 107 U/L — ABNORMAL HIGH (ref 0–35)
AST: 223 U/L — ABNORMAL HIGH (ref 0–37)
AST: 299 U/L — ABNORMAL HIGH (ref 0–37)
Albumin: 3.4 g/dL — ABNORMAL LOW (ref 3.5–5.2)
Albumin: 2.7 g/dL — ABNORMAL LOW (ref 3.5–5.2)
Alkaline Phosphatase: 79 U/L (ref 39–117)
Alkaline Phosphatase: 68 U/L (ref 39–117)
BUN: 10 mg/dL (ref 6–23)
BUN: 8 mg/dL (ref 6–23)
CO2: 19 mEq/L (ref 19–32)
CO2: 21 mEq/L (ref 19–32)
Calcium: 8.5 mg/dL (ref 8.4–10.5)
Calcium: 7.4 mg/dL — ABNORMAL LOW (ref 8.4–10.5)
Chloride: 92 mEq/L — ABNORMAL LOW (ref 96–112)
Chloride: 101 mEq/L (ref 96–112)
Creatinine, Ser: 0.53 mg/dL (ref 0.50–1.10)
Creatinine, Ser: 0.6 mg/dL (ref 0.50–1.10)
GFR calc Af Amer: >90 mL/min (ref >90)
GFR calc Af Amer: >90 mL/min (ref >90)
GFR calc non Af Amer: >90 mL/min (ref >90)
GFR calc non Af Amer: >90 mL/min (ref >90)
Glucose, Bld: 375 mg/dL — ABNORMAL HIGH (ref 70–99)
Glucose, Bld: 260 mg/dL — ABNORMAL HIGH (ref 70–99)
Potassium: 5 mEq/L (ref 3.7–5.3)
Potassium: 4.6 mEq/L (ref 3.7–5.3)
Sodium: 134 mEq/L — ABNORMAL LOW (ref 137–147)
Sodium: 140 mEq/L (ref 137–147)
Total Bilirubin: 0.3 mg/dL (ref 0.3–1.2)
Total Bilirubin: 0.2 mg/dL — ABNORMAL LOW (ref 0.3–1.2)
Total Protein: 7.3 g/dL (ref 6.0–8.3)
Total Protein: 6.4 g/dL (ref 6.0–8.3)

## 2014-04-20 LAB — I-STAT ARTERIAL BLOOD GAS, ED
Acid-base deficit: 4 mmol/L — ABNORMAL HIGH (ref 0.0–2.0)
Bicarbonate: 20.2 mEq/L (ref 20.0–24.0)
O2 Saturation: 96 %
Patient temperature: 36.2
TCO2: 21 mmol/L (ref 0–100)
pCO2 arterial: 33.6 mmHg — ABNORMAL LOW (ref 35.0–45.0)
pH, Arterial: 7.383 (ref 7.350–7.450)
pO2, Arterial: 83 mmHg (ref 80.0–100.0)

## 2014-04-20 LAB — RAPID URINE DRUG SCREEN, HOSP PERFORMED
Amphetamines: NOT DETECTED
Barbiturates: NOT DETECTED
Benzodiazepines: NOT DETECTED
Cocaine: POSITIVE — AB
Opiates: NOT DETECTED
Tetrahydrocannabinol: NOT DETECTED

## 2014-04-20 LAB — CK
Total CK: 42212 U/L — ABNORMAL HIGH (ref 7–177)
Total CK: 44127 U/L — ABNORMAL HIGH (ref 7–177)

## 2014-04-20 LAB — BASIC METABOLIC PANEL
BUN: 8 mg/dL (ref 6–23)
CO2: 21 mEq/L (ref 19–32)
Calcium: 7.3 mg/dL — ABNORMAL LOW (ref 8.4–10.5)
Chloride: 99 mEq/L (ref 96–112)
Creatinine, Ser: 0.55 mg/dL (ref 0.50–1.10)
GFR calc Af Amer: >90 mL/min (ref >90)
GFR calc non Af Amer: >90 mL/min (ref >90)
Glucose, Bld: 259 mg/dL — ABNORMAL HIGH (ref 70–99)
Potassium: 4.3 mEq/L (ref 3.7–5.3)
Sodium: 139 mEq/L (ref 137–147)

## 2014-04-20 LAB — ACETAMINOPHEN LEVEL: Acetaminophen (Tylenol), Serum: <15 ug/mL (ref 10–30)

## 2014-04-20 LAB — PROTIME-INR
INR: 0.99 (ref 0.00–1.49)
Prothrombin Time: 13.1 seconds (ref 11.6–15.2)

## 2014-04-20 LAB — PREGNANCY, URINE: Preg Test, Ur: NEGATIVE

## 2014-04-20 LAB — POC URINE PREG, ED: Preg Test, Ur: NEGATIVE

## 2014-04-20 LAB — PROCALCITONIN: Procalcitonin: 0.29 ng/mL

## 2014-04-20 LAB — GLUCOSE, CAPILLARY: Glucose-Capillary: 280 mg/dL — ABNORMAL HIGH (ref 70–99)

## 2014-04-20 LAB — CBG MONITORING, ED: Glucose-Capillary: 279 mg/dL — ABNORMAL HIGH (ref 70–99)

## 2014-04-20 LAB — TROPONIN I: Troponin I: 0.85 ng/mL (ref <0.30)

## 2014-04-20 LAB — SALICYLATE LEVEL: Salicylate Lvl: <2 mg/dL — ABNORMAL LOW (ref 2.8–20.0)

## 2014-04-20 LAB — MRSA PCR SCREENING: MRSA by PCR: POSITIVE — AB

## 2014-04-20 LAB — LACTIC ACID, PLASMA: Lactic Acid, Venous: 2.8 mmol/L — ABNORMAL HIGH (ref 0.5–2.2)

## 2014-04-20 LAB — I-STAT CG4 LACTIC ACID, ED: Lactic Acid, Venous: 7.06 mmol/L — ABNORMAL HIGH (ref 0.5–2.2)

## 2014-04-20 MED ORDER — PIPERACILLIN-TAZOBACTAM 3.375 G IVPB
3.3750 g | Freq: Three times a day (TID) | INTRAVENOUS | Status: DC
  Administered 2014-04-20 – 2014-04-21 (×2): 3.375 g via INTRAVENOUS
  Filled 2014-04-20 (×4): qty 50

## 2014-04-20 MED ORDER — SODIUM CHLORIDE 0.9 % IV BOLUS (SEPSIS)
2000.0000 mL | Freq: Once | INTRAVENOUS | Status: AC
  Administered 2014-04-20: 2000 mL via INTRAVENOUS

## 2014-04-20 MED ORDER — ASPIRIN 81 MG PO CHEW: 324.0000 mg | CHEWABLE_TABLET | ORAL | Status: AC

## 2014-04-20 MED ORDER — NALOXONE HCL 1 MG/ML IJ SOLN
INTRAMUSCULAR | Status: AC
  Administered 2014-04-20: 2 mg
  Filled 2014-04-20: qty 2

## 2014-04-20 MED ORDER — HEPARIN SODIUM (PORCINE) 5000 UNIT/ML IJ SOLN
5000.0000 [IU] | Freq: Three times a day (TID) | INTRAMUSCULAR | Status: DC
  Administered 2014-04-20 – 2014-04-28 (×24): 5000 [IU] via SUBCUTANEOUS
  Filled 2014-04-20 (×26): qty 1

## 2014-04-20 MED ORDER — ASPIRIN 300 MG RE SUPP: 300.0000 mg | RECTAL | Status: AC

## 2014-04-20 MED ORDER — SODIUM CHLORIDE 0.9 % IV BOLUS (SEPSIS)
1000.0000 mL | Freq: Once | INTRAVENOUS | Status: AC
  Administered 2014-04-20: 1000 mL via INTRAVENOUS

## 2014-04-20 MED ORDER — VANCOMYCIN HCL IN DEXTROSE 1-5 GM/200ML-% IV SOLN
1000.0000 mg | Freq: Once | INTRAVENOUS | Status: AC
  Administered 2014-04-20: 1000 mg via INTRAVENOUS
  Filled 2014-04-20: qty 200

## 2014-04-20 MED ORDER — SODIUM CHLORIDE 0.9 % IV SOLN
INTRAVENOUS | Status: DC
  Administered 2014-04-20 – 2014-04-21 (×4): via INTRAVENOUS
  Administered 2014-04-22: 1000 mL via INTRAVENOUS
  Administered 2014-04-22: 02:00:00 via INTRAVENOUS
  Administered 2014-04-22: 1000 mL via INTRAVENOUS
  Administered 2014-04-23 – 2014-04-24 (×2): via INTRAVENOUS

## 2014-04-20 MED ORDER — INSULIN ASPART 100 UNIT/ML ~~LOC~~ SOLN
0.0000 [IU] | SUBCUTANEOUS | Status: DC
  Administered 2014-04-20: 3 [IU] via SUBCUTANEOUS
  Administered 2014-04-20: 7 [IU] via SUBCUTANEOUS
  Administered 2014-04-21: 3 [IU] via SUBCUTANEOUS
  Administered 2014-04-21: 4 [IU] via SUBCUTANEOUS
  Administered 2014-04-21: 3 [IU] via SUBCUTANEOUS
  Administered 2014-04-21: 7 [IU] via SUBCUTANEOUS
  Administered 2014-04-21: 4 [IU] via SUBCUTANEOUS
  Administered 2014-04-22: 7 [IU] via SUBCUTANEOUS
  Administered 2014-04-22 (×2): 3 [IU] via SUBCUTANEOUS
  Administered 2014-04-22 (×2): 4 [IU] via SUBCUTANEOUS

## 2014-04-20 MED ORDER — SODIUM CHLORIDE 0.9 % IV SOLN: 250.0000 mL | INTRAVENOUS | Status: DC | PRN

## 2014-04-20 MED ORDER — PIPERACILLIN-TAZOBACTAM 3.375 G IVPB
3.3750 g | Freq: Once | INTRAVENOUS | Status: AC
  Administered 2014-04-20: 3.375 g via INTRAVENOUS
  Filled 2014-04-20: qty 50

## 2014-04-20 MED ORDER — IOHEXOL 300 MG/ML  SOLN
100.0000 mL | Freq: Once | INTRAMUSCULAR | Status: AC | PRN
  Administered 2014-04-20: 100 mL via INTRAVENOUS

## 2014-04-20 MED ORDER — NALOXONE HCL 0.4 MG/ML IJ SOLN: 0.4000 mg | Freq: Once | INTRAMUSCULAR | Status: DC

## 2014-04-20 NOTE — Progress Notes (Signed)
CRITICAL VALUE ALERT  Critical value received:  Troponin=0.85  Date of notification:  04/20/2014  Time of notification:  1950  Critical value read back:Yes.    Nurse who received alert:  Laurena Spies, RN  MD notified (1st page):  Elink  Time of first page:  2020  MD notified (2nd page):  Time of second page:  Responding MD:  Dr. Gerre Pebbles)  Time MD responded:  [phone]

## 2014-04-20 NOTE — ED Notes (Signed)
Pt off floor to MRI

## 2014-04-20 NOTE — ED Provider Notes (Signed)
CSN: [id number]     Arrival date & time 04/20/14  [id number] History   First MD Initiated Contact with Patient 04/20/14 1004     No chief complaint on file.    (Consider location/radiation/quality/duration/timing/severity/associated sxs/prior Treatment) HPI Comments: Level V caveat for altered mental status. Patient found down in her yard. EMS brought her in with decreased mental status, hypertension, tachycardia. Patient groggy oriented to self. Reports unable to move her legs. She does not know what happened. Denies any headache, neck pain, chest pain. She complains of abdominal pain states she needs to urinate. Her abdomen is distended and tender. There is right hip and low back tenderness. She does not know what happened. EMS report she has a history of cocaine abuse.  The history is provided by the patient and the EMS personnel. The history is limited by the condition of the patient.    Past Medical History  Diagnosis Date  . Diabetes mellitus without complication   . Hypertension   . Cocaine abuse   . Seizures    Past Surgical History  Procedure Laterality Date  . Cholecystectomy  2005   Family History  Problem Relation Age of Onset  . Hypertension Other    History  Substance Use Topics  . Smoking status: Current Every Day Smoker -- 1.00 packs/day for 28 years  . Smokeless tobacco: Not on file  . Alcohol Use: Not on file     Comment: 1/5th white liqour     OB History   Grav Para Term Preterm Abortions TAB SAB Ect Mult Living                 Review of Systems  Unable to perform ROS: Mental status change      Allergies  Erythromycin  Home Medications   Prior to Admission medications   Medication Sig Start Date End Date Taking? Authorizing Provider  ondansetron (ZOFRAN) 4 MG tablet Take 1 tablet (4 mg total) by mouth every 8 (eight) hours as needed for nausea or vomiting. 01/27/14   Blanchie Dessert, MD  ranitidine (ZANTAC) 150 MG tablet Take 1 tablet (150 mg total)  by mouth 2 (two) times daily. 01/27/14   Blanchie Dessert, MD   BP 112/78  Pulse 116  Temp(Src) 99.9 F (37.7 C) (Oral)  Resp 25  Ht 5\' 5"  (1.651 m)  Wt 175 lb (79.379 kg)  BMI 29.12 kg/m2  SpO2 100% Physical Exam  Nursing note and vitals reviewed. Constitutional: She appears well-developed and well-nourished. She appears distressed.  Obese, disheveled, dirty Somnolent, arousable to voice, protecting airway.  HENT:  Head: Normocephalic and atraumatic.  Mouth/Throat: Oropharynx is clear and moist. No oropharyngeal exudate.  Eyes: Conjunctivae and EOM are normal.  Pupils dilated 8 mm bilaterally, reactive  Neck: Normal range of motion. Neck supple.  No C-spine tenderness  Cardiovascular: Normal rate, normal heart sounds and intact distal pulses.   No murmur heard. Tachycardic to 150s  Pulmonary/Chest: Effort normal and breath sounds normal. No respiratory distress.  Abdominal: Soft. She exhibits distension. There is tenderness. There is no rebound and no guarding.  Distended tender abdomen  Genitourinary:  Normal rectal tone no gross blood  Musculoskeletal: Normal range of motion. She exhibits tenderness. She exhibits no edema.  Tenderness to palpation the lumbar spine  Neurological: She is alert. No cranial nerve deficit. She exhibits normal muscle tone. Coordination normal.  Patient oriented to name only. Follows commands. Equal grip strength bilaterally. Cranial nerves intact. No spontaneous movement lower extremities.  With encouragement she can barely wiggle her toes bilaterally.  Skin: Skin is warm.  Psychiatric: She has a normal mood and affect. Her behavior is normal.    ED Course  Procedures (including critical care time) Labs Review Labs Reviewed  CBC WITH DIFFERENTIAL - Abnormal; Notable for the following:    WBC 16.8 (*)    RBC 5.17 (*)    Hemoglobin 16.9 (*)    HCT 47.9 (*)    Neutrophils Relative % 84 (*)    Neutro Abs 14.0 (*)    Lymphocytes Relative 5 (*)     Monocytes Absolute 1.9 (*)    All other components within normal limits  COMPREHENSIVE METABOLIC PANEL - Abnormal; Notable for the following:    Sodium 134 (*)    Chloride 92 (*)    Glucose, Bld 375 (*)    Albumin 3.4 (*)    AST 223 (*)    ALT 75 (*)    All other components within normal limits  URINALYSIS, ROUTINE W REFLEX MICROSCOPIC - Abnormal; Notable for the following:    Glucose, UA >1000 (*)    Hgb urine dipstick LARGE (*)    Ketones, ur 40 (*)    Protein, ur 30 (*)    All other components within normal limits  CK - Abnormal; Notable for the following:    Total CK 42212 (*)    All other components within normal limits  SALICYLATE LEVEL - Abnormal; Notable for the following:    Salicylate Lvl 123456 (*)    All other components within normal limits  URINE RAPID DRUG SCREEN (HOSP PERFORMED) - Abnormal; Notable for the following:    Cocaine POSITIVE (*)    All other components within normal limits  URINE MICROSCOPIC-ADD ON - Abnormal; Notable for the following:    Casts GRANULAR CAST (*)    All other components within normal limits  GLUCOSE, CAPILLARY - Abnormal; Notable for the following:    Glucose-Capillary 280 (*)    All other components within normal limits  I-STAT ARTERIAL BLOOD GAS, ED - Abnormal; Notable for the following:    pCO2 arterial 33.6 (*)    Acid-base deficit 4.0 (*)    All other components within normal limits  I-STAT CG4 LACTIC ACID, ED - Abnormal; Notable for the following:    Lactic Acid, Venous 7.06 (*)    All other components within normal limits  I-STAT CHEM 8, ED - Abnormal; Notable for the following:    Sodium 135 (*)    Glucose, Bld 389 (*)    Calcium, Ion 1.02 (*)    Hemoglobin 18.0 (*)    HCT 53.0 (*)    All other components within normal limits  CBG MONITORING, ED - Abnormal; Notable for the following:    Glucose-Capillary 279 (*)    All other components within normal limits  CULTURE, BLOOD (ROUTINE X 2)  CULTURE, BLOOD (ROUTINE X 2)   MRSA PCR SCREENING  PROTIME-INR  ACETAMINOPHEN LEVEL  ETHANOL  PREGNANCY, URINE  COMPREHENSIVE METABOLIC PANEL  TROPONIN I  TROPONIN I  TROPONIN I  PRO B NATRIURETIC PEPTIDE  LACTIC ACID, PLASMA  CK  CBC  CK  PRO B NATRIURETIC PEPTIDE  TROPONIN I  TROPONIN I  MAGNESIUM  PHOSPHORUS  COMPREHENSIVE METABOLIC PANEL  BASIC METABOLIC PANEL  CBC WITH DIFFERENTIAL  LACTIC ACID, PLASMA  PROCALCITONIN  PROCALCITONIN  POC URINE PREG, ED    Imaging Review Ct Head Wo Contrast  04/20/2014   CLINICAL DATA:  Altered mental status  EXAM: CT HEAD WITHOUT CONTRAST  CT CERVICAL SPINE WITHOUT CONTRAST  TECHNIQUE: Multidetector CT imaging of the head and cervical spine was performed following the standard protocol without intravenous contrast. Multiplanar CT image reconstructions of the cervical spine were also generated.  COMPARISON:  06/26/2008  FINDINGS: CT HEAD FINDINGS  No skull fracture is noted. No intracranial hemorrhage, mass effect or midline shift. There is mild mucosal thickening with air-fluid level posterior aspect bilateral maxillary sinus. The mastoid air cells are unremarkable.  No hydrocephalus. No intra or extra-axial fluid collection. No acute cortical infarction. No mass lesion is noted on this unenhanced scan. Mild mucosal thickening with partial opacification bilateral ethmoid air cells.  CT CERVICAL SPINE FINDINGS  Axial images of the cervical spine shows no acute fracture or subluxation. Computer processed images shows no acute fracture or subluxation. Alignment, disc spaces and vertebral heights are preserved.  There is no pneumothorax in visualized lung apices.  IMPRESSION: 1. No acute intracranial abnormality. 2. Mild mucosal thickening with air-fluid level bilateral maxillary sinus. Mild mucosal thickening with partial opacification bilateral ethmoid air cells. 3. No cervical spine acute fracture or subluxation. No prevertebral soft tissue swelling. Cervical airway is patent.    Electronically Signed   By: Lahoma Crocker M.D.   On: 04/20/2014 13:46   Ct Chest W Contrast  04/20/2014   CLINICAL DATA:  Possible trauma.  Altered mental status.  EXAM: CT CHEST, ABDOMEN, AND PELVIS WITH CONTRAST  TECHNIQUE: Multidetector CT imaging of the chest, abdomen and pelvis was performed following the standard protocol during bolus administration of intravenous contrast.  CONTRAST:  180mL OMNIPAQUE IOHEXOL 300 MG/ML  SOLN  COMPARISON:  Chest radiograph earlier today  FINDINGS: CT CHEST FINDINGS  There is no evidence of great vessel injury. 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 The heart is normal in size. No enlarged axillary, mediastinal, or hilar lymph nodes are seen. There is no pleural or pericardial effusion. There is no pneumothorax. Ground-glass opacities are present in both upper lobes. Minimal subpleural opacity dependently in the lower lobes likely represents atelectasis. No acute osseous abnormality is identified in the chest.  CT ABDOMEN AND PELVIS FINDINGS  The gallbladder is surgically absent. The liver, spleen, adrenal glands, and pancreas have an unremarkable enhanced appearance. Hypodense renal lesions measure 1.2 cm in the right upper pole and 8 mm in the interpolar region on the left.  A Foley catheter is present in the bladder, which is decompressed. Uterus and ovaries are grossly unremarkable. The bowel is grossly unremarkable. No free fluid or enlarged lymph nodes are identified. Abdominal aorta and its major branch vessels are patent. No acute osseous abnormality is identified. Mild facet arthrosis is noted in the lower lumbar spine.  IMPRESSION: 1. Bilateral upper lobe ground-glass opacities, concerning for multifocal infection or possibly contusion given the possibility of trauma. 2. No acute abnormality identified in the abdomen or pelvis. 3. Small bilateral hypodense renal lesions, possibly cysts but indeterminate on this study. Consider further  evaluation with nonemergent abdominal MRI after the patient's acute illness has resolved.   Electronically Signed   By: Logan Bores   On: 04/20/2014 13:58   Ct Cervical Spine Wo Contrast  04/20/2014   CLINICAL DATA:  Altered mental status  EXAM: CT HEAD WITHOUT CONTRAST  CT CERVICAL SPINE WITHOUT CONTRAST  TECHNIQUE: Multidetector CT imaging of the head and cervical spine was performed following the standard protocol without intravenous contrast. Multiplanar CT image reconstructions  of the cervical spine were also generated.  COMPARISON:  06/26/2008  FINDINGS: CT HEAD FINDINGS  No skull fracture is noted. No intracranial hemorrhage, mass effect or midline shift. There is mild mucosal thickening with air-fluid level posterior aspect bilateral maxillary sinus. The mastoid air cells are unremarkable.  No hydrocephalus. No intra or extra-axial fluid collection. No acute cortical infarction. No mass lesion is noted on this unenhanced scan. Mild mucosal thickening with partial opacification bilateral ethmoid air cells.  CT CERVICAL SPINE FINDINGS  Axial images of the cervical spine shows no acute fracture or subluxation. Computer processed images shows no acute fracture or subluxation. Alignment, disc spaces and vertebral heights are preserved.  There is no pneumothorax in visualized lung apices.  IMPRESSION: 1. No acute intracranial abnormality. 2. Mild mucosal thickening with air-fluid level bilateral maxillary sinus. Mild mucosal thickening with partial opacification bilateral ethmoid air cells. 3. No cervical spine acute fracture or subluxation. No prevertebral soft tissue swelling. Cervical airway is patent.   Electronically Signed   By: Lahoma Crocker M.D.   On: 04/20/2014 13:46   Mr Thoracic Spine Wo Contrast  04/20/2014   CLINICAL DATA:  38 year old female found down, lower extremity weakness and urinary retention of unknown etiology. Initial encounter.  EXAM: MRI THORACIC AND LUMBAR SPINE WITHOUT CONTRAST   TECHNIQUE: Multiplanar and multiecho pulse sequences of the thoracic and lumbar spine were obtained without intravenous contrast.  COMPARISON:  Lumbar MRI 07/30/2004. CT Chest, Abdomen and Pelvis 04/20/2014.  FINDINGS: MR THORACIC SPINE FINDINGS  Limited sagittal imaging of the cervical spine is unremarkable.  Large body habitus. Normal thoracic vertebral height and alignment. No marrow edema or evidence of acute osseous abnormality.  Normal thoracic spinal canal patency. Spinal cord signal is within normal limits at all visualized levels. Conus medullaris described with lumbar findings.  No thoracic disc herniation. There is mild to moderate thoracic facet hypertrophy from T7-T8 T11-T12. Subsequent mild right T9 and T10 neural foraminal stenosis.  Abnormal signal in the lung apices, see recent chest CT. Visualized posterior paraspinal soft tissues are within normal limits. Negative visualized upper abdominal viscera.  MR LUMBAR SPINE FINDINGS  Normal lumbar segmentation, congruent with the thoracic numbering system.  Visualized lower thoracic spinal cord is normal with conus medularis at L1-L2.  Stable and normal lumbar vertebral height and alignment. No marrow edema or evidence of acute osseous abnormality.  Confluent abnormal signal and swelling of the bilateral erector spinae muscles from the L3 level to the sacrum, left greater than right. No associated fluid collection. No edema in knee adjacent posterior elements. There is a comparatively small volume of overlying subcutaneous fat edema.  Furthermore, coronal and oblique coronal imaging was performed demonstrating confluent Severe bilateral muscle edema also in the visible gluteus (series 19, image 20) and other deep adductor muscles (piriformis right greater than left). No fluid collection identified within the visible affected areas.  No lumbar disc degeneration or stenosis at the L3-L4 level and above.  L4-L5: Disc desiccation and circumferential disc  bulge. Moderate facet hypertrophy. Small central annular fissure. No significant stenosis.  L5-S1: Disc desiccation with small right paracentral disc protrusion. Mild facet hypertrophy. Epidural lipomatosis. Conjoined right L5-S1 nerve roots such that there is moderate right lateral recess stenosis affecting the right S1 roots. No other stenosis.  IMPRESSION: MR LUMBAR SPINE IMPRESSION  1. Confluent severe muscle edema about the lower pelvis, affecting gluteal muscles and adductors. Separate but similar involvement of the lower lumbar erector spinae muscles. Considering the patient  was found down, this may reflect severe myositis in the setting of generalized rhabdomyolysis. Infectious myositis is the main differential consideration. 2. No lumbar spinal stenosis. Mild lower lumbar degenerative changes only affecting the right S1 nerve roots in the lateral recess.  MR THORACIC SPINE IMPRESSION  1. Normal thoracic spine and spinal cord, aside from lower thoracic facet degeneration. 2. Abnormal signal in the upper lungs, see chest CT from earlier today.   Electronically Signed   By: Lars Pinks M.D.   On: 04/20/2014 17:31   Mr Lumbar Spine Wo Contrast  04/20/2014   CLINICAL DATA:  38 year old female found down, lower extremity weakness and urinary retention of unknown etiology. Initial encounter.  EXAM: MRI THORACIC AND LUMBAR SPINE WITHOUT CONTRAST  TECHNIQUE: Multiplanar and multiecho pulse sequences of the thoracic and lumbar spine were obtained without intravenous contrast.  COMPARISON:  Lumbar MRI 07/30/2004. CT Chest, Abdomen and Pelvis 04/20/2014.  FINDINGS: MR THORACIC SPINE FINDINGS  Limited sagittal imaging of the cervical spine is unremarkable.  Large body habitus. Normal thoracic vertebral height and alignment. No marrow edema or evidence of acute osseous abnormality.  Normal thoracic spinal canal patency. Spinal cord signal is within normal limits at all visualized levels. Conus medullaris described with  lumbar findings.  No thoracic disc herniation. There is mild to moderate thoracic facet hypertrophy from T7-T8 T11-T12. Subsequent mild right T9 and T10 neural foraminal stenosis.  Abnormal signal in the lung apices, see recent chest CT. Visualized posterior paraspinal soft tissues are within normal limits. Negative visualized upper abdominal viscera.  MR LUMBAR SPINE FINDINGS  Normal lumbar segmentation, congruent with the thoracic numbering system.  Visualized lower thoracic spinal cord is normal with conus medularis at L1-L2.  Stable and normal lumbar vertebral height and alignment. No marrow edema or evidence of acute osseous abnormality.  Confluent abnormal signal and swelling of the bilateral erector spinae muscles from the L3 level to the sacrum, left greater than right. No associated fluid collection. No edema in knee adjacent posterior elements. There is a comparatively small volume of overlying subcutaneous fat edema.  Furthermore, coronal and oblique coronal imaging was performed demonstrating confluent Severe bilateral muscle edema also in the visible gluteus (series 19, image 20) and other deep adductor muscles (piriformis right greater than left). No fluid collection identified within the visible affected areas.  No lumbar disc degeneration or stenosis at the L3-L4 level and above.  L4-L5: Disc desiccation and circumferential disc bulge. Moderate facet hypertrophy. Small central annular fissure. No significant stenosis.  L5-S1: Disc desiccation with small right paracentral disc protrusion. Mild facet hypertrophy. Epidural lipomatosis. Conjoined right L5-S1 nerve roots such that there is moderate right lateral recess stenosis affecting the right S1 roots. No other stenosis.  IMPRESSION: MR LUMBAR SPINE IMPRESSION  1. Confluent severe muscle edema about the lower pelvis, affecting gluteal muscles and adductors. Separate but similar involvement of the lower lumbar erector spinae muscles. Considering the  patient was found down, this may reflect severe myositis in the setting of generalized rhabdomyolysis. Infectious myositis is the main differential consideration. 2. No lumbar spinal stenosis. Mild lower lumbar degenerative changes only affecting the right S1 nerve roots in the lateral recess.  MR THORACIC SPINE IMPRESSION  1. Normal thoracic spine and spinal cord, aside from lower thoracic facet degeneration. 2. Abnormal signal in the upper lungs, see chest CT from earlier today.   Electronically Signed   By: Lars Pinks M.D.   On: 04/20/2014 17:31   Ct Abdomen Pelvis  W Contrast  04/20/2014   CLINICAL DATA:  Possible trauma.  Altered mental status.  EXAM: CT CHEST, ABDOMEN, AND PELVIS WITH CONTRAST  TECHNIQUE: Multidetector CT imaging of the chest, abdomen and pelvis was performed following the standard protocol during bolus administration of intravenous contrast.  CONTRAST:  138mL OMNIPAQUE IOHEXOL 300 MG/ML  SOLN  COMPARISON:  Chest radiograph earlier today  FINDINGS: CT CHEST FINDINGS  There is no evidence of great vessel injury. 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 The heart is normal in size. No enlarged axillary, mediastinal, or hilar lymph nodes are seen. There is no pleural or pericardial effusion. There is no pneumothorax. Ground-glass opacities are present in both upper lobes. Minimal subpleural opacity dependently in the lower lobes likely represents atelectasis. No acute osseous abnormality is identified in the chest.  CT ABDOMEN AND PELVIS FINDINGS  The gallbladder is surgically absent. The liver, spleen, adrenal glands, and pancreas have an unremarkable enhanced appearance. Hypodense renal lesions measure 1.2 cm in the right upper pole and 8 mm in the interpolar region on the left.  A Foley catheter is present in the bladder, which is decompressed. Uterus and ovaries are grossly unremarkable. The bowel is grossly unremarkable. No free fluid or enlarged lymph nodes are  identified. Abdominal aorta and its major branch vessels are patent. No acute osseous abnormality is identified. Mild facet arthrosis is noted in the lower lumbar spine.  IMPRESSION: 1. Bilateral upper lobe ground-glass opacities, concerning for multifocal infection or possibly contusion given the possibility of trauma. 2. No acute abnormality identified in the abdomen or pelvis. 3. Small bilateral hypodense renal lesions, possibly cysts but indeterminate on this study. Consider further evaluation with nonemergent abdominal MRI after the patient's acute illness has resolved.   Electronically Signed   By: Logan Bores   On: 04/20/2014 13:58   Dg Pelvis Portable  04/20/2014   CLINICAL DATA:  found down  EXAM: PORTABLE PELVIS 1-2 VIEWS  COMPARISON:  03/17/2019 2006  FINDINGS: There is no evidence of pelvic fracture or diastasis. No other pelvic bone lesions are seen. Bilateral pelvic calcifications which were not present on the prior film.  IMPRESSION: 1. Negative for fracture. 2. New bilateral pelvic calcifications.   Electronically Signed   By: Arne Cleveland M.D.   On: 04/20/2014 10:50   Dg Chest Portable 1 View  04/20/2014   CLINICAL DATA:  found down  EXAM: PORTABLE CHEST - 1 VIEW  COMPARISON:  03/16/2014  FINDINGS: New interstitial and airspace opacities in both upper lobes. Lung bases remain clear. Heart size upper limits normal for technique. No effusion. Visualized skeletal structures are unremarkable.  IMPRESSION: 1. Bilateral upper lobe infiltrates, new since prior study.   Electronically Signed   By: Arne Cleveland M.D.   On: 04/20/2014 10:45     EKG Interpretation   Date/Time:  Sunday April 20 2014 09:54:44 EDT Ventricular Rate:  123 PR Interval:  119 QRS Duration: 99 QT Interval:  446 QTC Calculation: 638 R Axis:   92 Text Interpretation:  Sinus tachycardia Borderline right axis deviation  Low voltage, extremity leads ST elev, probable normal early repol pattern  Prolonged QT  interval similar ST elevation anterior leads Confirmed by  Wyvonnia Dusky  MD, STEPHEN [phone]) on 04/20/2014 10:04:50 AM Also confirmed by  Wyvonnia Dusky  MD, [place] [id number])  on 04/20/2014 10:08:22 AM      MDM   Final diagnoses:  Traumatic rhabdomyolysis, initial encounter  Sepsis, due to unspecified organism  Weakness of both lower extremities   Found down in her yard. Unclear circumstances. Patient is tachycardic and hypertensive with history of drug abuse. EKG shows ST elevation in V1 and V2 which is similar to previous and is not meet STEMI criteria. She denies any chest pain.  EKG discussed with Dr. Claiborne Billings who agrees.  FAST exam negative. Patient with extremely distended bladder. Minimal movement in lower extremities.  Concern for spinal cord injury but normal rectal tone.  Foley placed. Urine output greater than 2.5 L  Patient has decreased strength in her legs and urinary retention. There is concern for possible spinal cord injury. D/w Dr. Arnoldo Morale of neurosurgery.  He agrees with MRI imaging of spinal cord as patient is able.  Patient will need imaging of head and spine. CT head and C spine negative. Additional trauma imaging was performed due to unclear circumstances. Ground glass opacities in lungs bilaterally. Will treat for aspiration pneumonia after cultures obtained. Ct a/p negative.  CK 42000, Cr ok. Leukocytosis with elevated lactate. PRobable sepsis from PNA.  Persistent tachycardia to 110-120s.  Patient continues to protect airway.   S/p 4L IV in ED for severe rhabdomyolsis.  Lactate 7. Hyperglycemia, possible DKA. BP [id number] systolic.  MRI of T and L spine to be obtained. D/w Dr. Chase Caller and NP Alfredo Martinez of PCCM.    CRITICAL CARE Performed by: Ezequiel Essex Total critical care time: 45 Critical care time was exclusive of separately billable procedures and treating other patients. Critical care was necessary to treat or prevent imminent or life-threatening  deterioration. Critical care was time spent personally by me on the following activities: development of treatment plan with patient and/or surrogate as well as nursing, discussions with consultants, evaluation of patient's response to treatment, examination of patient, obtaining history from patient or surrogate, ordering and performing treatments and interventions, ordering and review of laboratory studies, ordering and review of radiographic studies, pulse oximetry and re-evaluation of patient's condition.   EMERGENCY DEPARTMENT [place] FAST EXAM  INDICATIONS:Tachycardia, Blunt trauma to the Thorax and Blunt injury of abdomen  PERFORMED BY: Myself  IMAGES ARCHIVED?: No  FINDINGS: All views positive and All views negative  LIMITATIONS:  Body habitus and Emergent procedure  INTERPRETATION:  No abdominal free fluid and No pericardial effusion  COMMENT:  Very distended bladder.    Ezequiel Essex, MD 04/20/14 [phone]

## 2014-04-20 NOTE — ED Notes (Signed)
NOTIFIED L. PARKER ,PA FOR DR. RANCOYR OF PATIENTS PANIC LAB RESULTS OF CG4+ LACTIC ACID @10 : 45 AM ,04/20/2014

## 2014-04-20 NOTE — ED Notes (Signed)
No response to narcan

## 2014-04-20 NOTE — ED Notes (Signed)
CT notified of neg preg

## 2014-04-20 NOTE — ED Notes (Signed)
NOTIFIED

## 2014-04-20 NOTE — ED Notes (Signed)
Post MRI pt HR 129.  Bil LE remain cool to touch.  Pt now able to state she feels numb in lower extremities, R greater than L.

## 2014-04-20 NOTE — Progress Notes (Signed)
Chaplain responded to level two trauma. No family present.

## 2014-04-20 NOTE — Progress Notes (Signed)
ANTIBIOTIC CONSULT NOTE - INITIAL  Pharmacy Consult for Zosyn Indication: rule out pneumonia  Allergies  Allergen Reactions  . Erythromycin Rash   Patient Measurements: Height: 5\' 5"  (165.1 cm) Weight: 175 lb (79.379 kg) IBW/kg (Calculated) : 57  Vital Signs: Temp: 97.7 F (36.5 C) (06/28 1340) BP: 119/82 mmHg (06/28 1530) Pulse Rate: 116 (06/28 1530)   Intake/Output from this shift: Total I/O In: -  Out: 3100 [Urine:3100]  Labs:  Recent Labs  04/20/14 1020 04/20/14 1034  WBC 16.8*  --   HGB 16.9* 18.0*  PLT 386  --   CREATININE 0.53 0.70   Estimated Creatinine Clearance: 99.3 ml/min (by C-G formula based on Cr of 0.7).  Microbiology: No results found for this or any previous visit (from the past 720 hour(s)).  Medical History: Past Medical History  Diagnosis Date  . Diabetes mellitus without complication   . Hypertension   . Cocaine abuse   . Seizures    Medications:  Anti-infectives   Start     Dose/Rate Route Frequency Ordered Stop   04/20/14 1100  vancomycin (VANCOCIN) IVPB 1000 mg/200 mL premix     1,000 mg 200 mL/hr over 60 Minutes Intravenous  Once 04/20/14 1053 04/20/14 1407   04/20/14 1100  piperacillin-tazobactam (ZOSYN) IVPB 3.375 g     3.375 g 12.5 mL/hr over 240 Minutes Intravenous  Once 04/20/14 1053 04/20/14 1353     Assessment: 38 yo admitted after being found down in her yard.  There is some concern for aspiration pneumonia and we have been asked to dose her IV antibiotics.  She received one dose each of Vanc and Zosyn.  She has normal renal function with creatinine of 0.7 and an estimated clearance > 2ml/min.  She has leukocytosis with WBC 16.8K, lactic acid of 7.06.  She has rhabdo with CK of > 29562.  She has abnormal appearing chest CT with bilateral upper lob ground-glass opacities.  She has responded to fluid challenge and is net negative 3.1L.  Urine tox screen is positive for cocaine.  Blood cultures have been obtained  Goal of  Therapy:  Therapeutic response to IV antibiotics  Plan:  1.  Continue IV Zosyn 3.375gm every 8 hours to be delivered over 4 hours. 2.  F/U clinical response, culture data and ongoing need to broaden coverage. 3.  F/U renal function and adjust dose as needed.  Rober Minion, PharmD., MS Clinical Pharmacist Pager:  [phone] Thank you for allowing pharmacy to be part of this patients care team. 04/20/2014,6:01 PM

## 2014-04-20 NOTE — Progress Notes (Signed)
CRITICAL VALUE ALERT  Critical value received:  MRSA + by PCR  Date of notification:  04/20/2014  Time of notification:  8:36 PM  Critical value read back:Yes.    Nurse who received alert:  Barrie Lyme, RN  MD notified (1st page):  Elink  Time of first page:  2015  MD notified (2nd page):  Time of second page:  Responding MD:  Warren Lacy  Time MD responded:  2015

## 2014-04-20 NOTE — H&P (Addendum)
PULMONARY / CRITICAL CARE MEDICINE   Name: LYNSEE PRIBBLE MRN: [id number] DOB: 1976-04-14    ADMISSION DATE:  04/20/2014  REFERRING MD :  Dr. Wyvonnia Dusky  PRIMARY SERVICE: PCCM   CHIEF COMPLAINT:  AMS  BRIEF PATIENT DESCRIPTION: 38 y/o F, smoker, with PMH of DM, HTN, Seizures, polysubstance abuse, multiple admissions for substance abuse and involuntary commitment admitted on 6/28 after being found down in yard.  Found to have elevated CK, glucose, lactic acid and CXR concerning for aspiration.  UDS positive for cocaine.  Patient unable to move lower extremities.    SIGNIFICANT EVENTS and STUDIES:   6/28 - Admit after being found down - elevated CK, glucose, & lactic acid.  CXR concerning for aspiration.  UDS positive for cocaine, pt with no memory of events.   6/28 CT Head>>>neg for ICH but has ethmoid and maxillary sinusitis 6/28 CT C-Spine>>>no cervical fx 6/26 CT Chest >>>bilateral upper GGO 6/28 CT ABD/Pelvis>>>no acute abnormality, small bilateral renal lesions (? Cysts) 6/28 - MRI T spine: normal 6/28 - MRI L spine- no stenosis, severe gluteal, pelvic and erector spinae mysositis (pulses in lowers are dopplered +)   LINES / TUBES:   CULTURES: BCx2 >>>  ANTIBIOTICS: Zosyn 6/28 (empiric aspiration, recent admit)>>>  HISTORY OF PRESENT ILLNESS:  38 y/o F, smoker, with PMH of DM, HTN, Seizures, polysubstance abuse, multiple admissions for substance abuse and involuntary commitment admitted on 6/28 after being found down in yard.  Found to have elevated CK, glucose, lactic acid and CXR concerning for aspiration.  UDS positive for cocaine.  Patient unable to move lower extremities but has sensation as below.    Mother reports she has been in / out of multiple PSY admissions & rehabs.  She has a 20+ year of drug abuse.  Mother reports her grandfather gives her money that supports her drug habit.  She has been in prison in the past and earned her GED there.    PAST MEDICAL HISTORY :   Past Medical History  Diagnosis Date  . Diabetes mellitus without complication   . Hypertension   . Cocaine abuse   . Seizures    Past Surgical History  Procedure Laterality Date  . Cholecystectomy  2005   Prior to Admission medications   Medication Sig Start Date End Date Taking? Authorizing Provider  ondansetron (ZOFRAN) 4 MG tablet Take 1 tablet (4 mg total) by mouth every 8 (eight) hours as needed for nausea or vomiting. 01/27/14   Blanchie Dessert, MD  ranitidine (ZANTAC) 150 MG tablet Take 1 tablet (150 mg total) by mouth 2 (two) times daily. 01/27/14   Blanchie Dessert, MD   Allergies  Allergen Reactions  . Erythromycin Rash    FAMILY HISTORY:  Family History  Problem Relation Age of Onset  . Hypertension Other    SOCIAL HISTORY:  reports that she has been smoking.  She does not have any smokeless tobacco history on file. She reports that she uses illicit drugs (Cocaine). Her alcohol history is not on file.  REVIEW OF SYSTEMS:  Unable to assess as patient is altered.  Information obtained from mother at bedside and previous medical documentation.    SUBJECTIVE:   VITAL SIGNS: Temp:  [97 F (36.1 C)-97.7 F (36.5 C)] 97.7 F (36.5 C) (06/28 1340) Pulse Rate:  [112-124] 120 (06/28 1430) Resp:  [8-24] 8 (06/28 1430) BP: (106-212)/(57-138) 113/90 mmHg (06/28 1415) SpO2:  [96 %-100 %] 99 % (06/28 1430) HEMODYNAMICS:   VENTILATOR SETTINGS:  INTAKE / OUTPUT: Intake/Output     06/27 0701 - 06/28 0700 06/28 0701 - 06/29 0700   Urine  3100   Total Output   3100   Net   -3100          PHYSICAL EXAMINATION: General: morbidly obese F, in NAD Neuro:  Lethargic, arouses to voice & drifts back to sleep.  Pupils 1mm =R, upper ext sensation intact.  LLE: sensation felt jsut below knee, RLE: sensation felt above the knee   (staff MD exam later in ICU: paraparesis, Power 1 for lower extremities, glove and stocking anesthesia distal 1/3rd. No c spine tenderness)  HEENT:   Mm pink/moist Cardiovascular:  s1s2 rrr, tachy, no m/r/g Lungs:  resp's even/non-labored, lungs bilaterally coarse  Abdomen:  Obese, soft, bsx4 hypoactive Musculoskeletal:  No acute deformities  Skin:  Tiny scratches on abd, no other wounds / abrasions  LABS: - updaetd 6pm 04/20/14 PULMONARY  Recent Labs Lab 04/20/14 1034 04/20/14 1150  PHART  --  7.383  PCO2ART  --  33.6*  PO2ART  --  83.0  HCO3  --  20.2  TCO2 19 21  O2SAT  --  96.0    CBC  Recent Labs Lab 04/20/14 1020 04/20/14 1034  HGB 16.9* 18.0*  HCT 47.9* 53.0*  WBC 16.8*  --   PLT 386  --     COAGULATION  Recent Labs Lab 04/20/14 1020  INR 0.99    CARDIAC  No results found for this basename: TROPONINI,  in the last 168 hours No results found for this basename: PROBNP,  in the last 168 hours   CHEMISTRY  Recent Labs Lab 04/20/14 1020 04/20/14 1034  NA 134* 135*  K 5.0 4.8  CL 92* 98  CO2 19  --   GLUCOSE 375* 389*  BUN 10 9  CREATININE 0.53 0.70  CALCIUM 8.5  --    CrCl is unknown because there is no height on file for the current visit.   LIVER  Recent Labs Lab 04/20/14 1020  AST 223*  ALT 75*  ALKPHOS 79  BILITOT 0.3  PROT 7.3  ALBUMIN 3.4*  INR 0.99     INFECTIOUS  Recent Labs Lab 04/20/14 1034  LATICACIDVEN 7.06*     ENDOCRINE CBG (last 3)   Recent Labs  04/20/14 1348  GLUCAP 279*         IMAGING x48h  Ct Head Wo Contrast  04/20/2014   CLINICAL DATA:  Altered mental status  EXAM: CT HEAD WITHOUT CONTRAST  CT CERVICAL SPINE WITHOUT CONTRAST  TECHNIQUE: Multidetector CT imaging of the head and cervical spine was performed following the standard protocol without intravenous contrast. Multiplanar CT image reconstructions of the cervical spine were also generated.  COMPARISON:  06/26/2008  FINDINGS: CT HEAD FINDINGS  No skull fracture is noted. No intracranial hemorrhage, mass effect or midline shift. There is mild mucosal thickening with air-fluid level  posterior aspect bilateral maxillary sinus. The mastoid air cells are unremarkable.  No hydrocephalus. No intra or extra-axial fluid collection. No acute cortical infarction. No mass lesion is noted on this unenhanced scan. Mild mucosal thickening with partial opacification bilateral ethmoid air cells.  CT CERVICAL SPINE FINDINGS  Axial images of the cervical spine shows no acute fracture or subluxation. Computer processed images shows no acute fracture or subluxation. Alignment, disc spaces and vertebral heights are preserved.  There is no pneumothorax in visualized lung apices.  IMPRESSION: 1. No acute intracranial abnormality. 2. Mild mucosal  thickening with air-fluid level bilateral maxillary sinus. Mild mucosal thickening with partial opacification bilateral ethmoid air cells. 3. No cervical spine acute fracture or subluxation. No prevertebral soft tissue swelling. Cervical airway is patent.   Electronically Signed   By: Lahoma Crocker M.D.   On: 04/20/2014 13:46   Ct Chest W Contrast  04/20/2014   CLINICAL DATA:  Possible trauma.  Altered mental status.  EXAM: CT CHEST, ABDOMEN, AND PELVIS WITH CONTRAST  TECHNIQUE: Multidetector CT imaging of the chest, abdomen and pelvis was performed following the standard protocol during bolus administration of intravenous contrast.  CONTRAST:  122mL OMNIPAQUE IOHEXOL 300 MG/ML  SOLN  COMPARISON:  Chest radiograph earlier today  FINDINGS: CT CHEST FINDINGS  There is no evidence of great vessel injury. 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 The heart is normal in size. No enlarged axillary, mediastinal, or hilar lymph nodes are seen. There is no pleural or pericardial effusion. There is no pneumothorax. Ground-glass opacities are present in both upper lobes. Minimal subpleural opacity dependently in the lower lobes likely represents atelectasis. No acute osseous abnormality is identified in the chest.  CT ABDOMEN AND PELVIS FINDINGS  The gallbladder  is surgically absent. The liver, spleen, adrenal glands, and pancreas have an unremarkable enhanced appearance. Hypodense renal lesions measure 1.2 cm in the right upper pole and 8 mm in the interpolar region on the left.  A Foley catheter is present in the bladder, which is decompressed. Uterus and ovaries are grossly unremarkable. The bowel is grossly unremarkable. No free fluid or enlarged lymph nodes are identified. Abdominal aorta and its major branch vessels are patent. No acute osseous abnormality is identified. Mild facet arthrosis is noted in the lower lumbar spine.  IMPRESSION: 1. Bilateral upper lobe ground-glass opacities, concerning for multifocal infection or possibly contusion given the possibility of trauma. 2. No acute abnormality identified in the abdomen or pelvis. 3. Small bilateral hypodense renal lesions, possibly cysts but indeterminate on this study. Consider further evaluation with nonemergent abdominal MRI after the patient's acute illness has resolved.   Electronically Signed   By: Logan Bores   On: 04/20/2014 13:58   Ct Cervical Spine Wo Contrast  04/20/2014   CLINICAL DATA:  Altered mental status  EXAM: CT HEAD WITHOUT CONTRAST  CT CERVICAL SPINE WITHOUT CONTRAST  TECHNIQUE: Multidetector CT imaging of the head and cervical spine was performed following the standard protocol without intravenous contrast. Multiplanar CT image reconstructions of the cervical spine were also generated.  COMPARISON:  06/26/2008  FINDINGS: CT HEAD FINDINGS  No skull fracture is noted. No intracranial hemorrhage, mass effect or midline shift. There is mild mucosal thickening with air-fluid level posterior aspect bilateral maxillary sinus. The mastoid air cells are unremarkable.  No hydrocephalus. No intra or extra-axial fluid collection. No acute cortical infarction. No mass lesion is noted on this unenhanced scan. Mild mucosal thickening with partial opacification bilateral ethmoid air cells.  CT CERVICAL  SPINE FINDINGS  Axial images of the cervical spine shows no acute fracture or subluxation. Computer processed images shows no acute fracture or subluxation. Alignment, disc spaces and vertebral heights are preserved.  There is no pneumothorax in visualized lung apices.  IMPRESSION: 1. No acute intracranial abnormality. 2. Mild mucosal thickening with air-fluid level bilateral maxillary sinus. Mild mucosal thickening with partial opacification bilateral ethmoid air cells. 3. No cervical spine acute fracture or subluxation. No prevertebral soft tissue swelling. Cervical airway is patent.   Electronically Signed  By: Lahoma Crocker M.D.   On: 04/20/2014 13:46   Mr Thoracic Spine Wo Contrast  04/20/2014   CLINICAL DATA:  38 year old female found down, lower extremity weakness and urinary retention of unknown etiology. Initial encounter.  EXAM: MRI THORACIC AND LUMBAR SPINE WITHOUT CONTRAST  TECHNIQUE: Multiplanar and multiecho pulse sequences of the thoracic and lumbar spine were obtained without intravenous contrast.  COMPARISON:  Lumbar MRI 07/30/2004. CT Chest, Abdomen and Pelvis 04/20/2014.  FINDINGS: MR THORACIC SPINE FINDINGS  Limited sagittal imaging of the cervical spine is unremarkable.  Large body habitus. Normal thoracic vertebral height and alignment. No marrow edema or evidence of acute osseous abnormality.  Normal thoracic spinal canal patency. Spinal cord signal is within normal limits at all visualized levels. Conus medullaris described with lumbar findings.  No thoracic disc herniation. There is mild to moderate thoracic facet hypertrophy from T7-T8 T11-T12. Subsequent mild right T9 and T10 neural foraminal stenosis.  Abnormal signal in the lung apices, see recent chest CT. Visualized posterior paraspinal soft tissues are within normal limits. Negative visualized upper abdominal viscera.  MR LUMBAR SPINE FINDINGS  Normal lumbar segmentation, congruent with the thoracic numbering system.  Visualized  lower thoracic spinal cord is normal with conus medularis at L1-L2.  Stable and normal lumbar vertebral height and alignment. No marrow edema or evidence of acute osseous abnormality.  Confluent abnormal signal and swelling of the bilateral erector spinae muscles from the L3 level to the sacrum, left greater than right. No associated fluid collection. No edema in knee adjacent posterior elements. There is a comparatively small volume of overlying subcutaneous fat edema.  Furthermore, coronal and oblique coronal imaging was performed demonstrating confluent Severe bilateral muscle edema also in the visible gluteus (series 19, image 20) and other deep adductor muscles (piriformis right greater than left). No fluid collection identified within the visible affected areas.  No lumbar disc degeneration or stenosis at the L3-L4 level and above.  L4-L5: Disc desiccation and circumferential disc bulge. Moderate facet hypertrophy. Small central annular fissure. No significant stenosis.  L5-S1: Disc desiccation with small right paracentral disc protrusion. Mild facet hypertrophy. Epidural lipomatosis. Conjoined right L5-S1 nerve roots such that there is moderate right lateral recess stenosis affecting the right S1 roots. No other stenosis.  IMPRESSION: MR LUMBAR SPINE IMPRESSION  1. Confluent severe muscle edema about the lower pelvis, affecting gluteal muscles and adductors. Separate but similar involvement of the lower lumbar erector spinae muscles. Considering the patient was found down, this may reflect severe myositis in the setting of generalized rhabdomyolysis. Infectious myositis is the main differential consideration. 2. No lumbar spinal stenosis. Mild lower lumbar degenerative changes only affecting the right S1 nerve roots in the lateral recess.  MR THORACIC SPINE IMPRESSION  1. Normal thoracic spine and spinal cord, aside from lower thoracic facet degeneration. 2. Abnormal signal in the upper lungs, see chest CT  from earlier today.   Electronically Signed   By: Lars Pinks M.D.   On: 04/20/2014 17:31   Mr Lumbar Spine Wo Contrast  04/20/2014   CLINICAL DATA:  38 year old female found down, lower extremity weakness and urinary retention of unknown etiology. Initial encounter.  EXAM: MRI THORACIC AND LUMBAR SPINE WITHOUT CONTRAST  TECHNIQUE: Multiplanar and multiecho pulse sequences of the thoracic and lumbar spine were obtained without intravenous contrast.  COMPARISON:  Lumbar MRI 07/30/2004. CT Chest, Abdomen and Pelvis 04/20/2014.  FINDINGS: MR THORACIC SPINE FINDINGS  Limited sagittal imaging of the cervical spine is unremarkable.  Large body habitus. Normal thoracic vertebral height and alignment. No marrow edema or evidence of acute osseous abnormality.  Normal thoracic spinal canal patency. Spinal cord signal is within normal limits at all visualized levels. Conus medullaris described with lumbar findings.  No thoracic disc herniation. There is mild to moderate thoracic facet hypertrophy from T7-T8 T11-T12. Subsequent mild right T9 and T10 neural foraminal stenosis.  Abnormal signal in the lung apices, see recent chest CT. Visualized posterior paraspinal soft tissues are within normal limits. Negative visualized upper abdominal viscera.  MR LUMBAR SPINE FINDINGS  Normal lumbar segmentation, congruent with the thoracic numbering system.  Visualized lower thoracic spinal cord is normal with conus medularis at L1-L2.  Stable and normal lumbar vertebral height and alignment. No marrow edema or evidence of acute osseous abnormality.  Confluent abnormal signal and swelling of the bilateral erector spinae muscles from the L3 level to the sacrum, left greater than right. No associated fluid collection. No edema in knee adjacent posterior elements. There is a comparatively small volume of overlying subcutaneous fat edema.  Furthermore, coronal and oblique coronal imaging was performed demonstrating confluent Severe bilateral  muscle edema also in the visible gluteus (series 19, image 20) and other deep adductor muscles (piriformis right greater than left). No fluid collection identified within the visible affected areas.  No lumbar disc degeneration or stenosis at the L3-L4 level and above.  L4-L5: Disc desiccation and circumferential disc bulge. Moderate facet hypertrophy. Small central annular fissure. No significant stenosis.  L5-S1: Disc desiccation with small right paracentral disc protrusion. Mild facet hypertrophy. Epidural lipomatosis. Conjoined right L5-S1 nerve roots such that there is moderate right lateral recess stenosis affecting the right S1 roots. No other stenosis.  IMPRESSION: MR LUMBAR SPINE IMPRESSION  1. Confluent severe muscle edema about the lower pelvis, affecting gluteal muscles and adductors. Separate but similar involvement of the lower lumbar erector spinae muscles. Considering the patient was found down, this may reflect severe myositis in the setting of generalized rhabdomyolysis. Infectious myositis is the main differential consideration. 2. No lumbar spinal stenosis. Mild lower lumbar degenerative changes only affecting the right S1 nerve roots in the lateral recess.  MR THORACIC SPINE IMPRESSION  1. Normal thoracic spine and spinal cord, aside from lower thoracic facet degeneration. 2. Abnormal signal in the upper lungs, see chest CT from earlier today.   Electronically Signed   By: Lars Pinks M.D.   On: 04/20/2014 17:31   Ct Abdomen Pelvis W Contrast  04/20/2014   CLINICAL DATA:  Possible trauma.  Altered mental status.  EXAM: CT CHEST, ABDOMEN, AND PELVIS WITH CONTRAST  TECHNIQUE: Multidetector CT imaging of the chest, abdomen and pelvis was performed following the standard protocol during bolus administration of intravenous contrast.  CONTRAST:  129mL OMNIPAQUE IOHEXOL 300 MG/ML  SOLN  COMPARISON:  Chest radiograph earlier today  FINDINGS: CT CHEST FINDINGS  There is no evidence of great vessel  injury. 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 The heart is normal in size. No enlarged axillary, mediastinal, or hilar lymph nodes are seen. There is no pleural or pericardial effusion. There is no pneumothorax. Ground-glass opacities are present in both upper lobes. Minimal subpleural opacity dependently in the lower lobes likely represents atelectasis. No acute osseous abnormality is identified in the chest.  CT ABDOMEN AND PELVIS FINDINGS  The gallbladder is surgically absent. The liver, spleen, adrenal glands, and pancreas have an unremarkable enhanced appearance. Hypodense renal lesions measure 1.2 cm in the  right upper pole and 8 mm in the interpolar region on the left.  A Foley catheter is present in the bladder, which is decompressed. Uterus and ovaries are grossly unremarkable. The bowel is grossly unremarkable. No free fluid or enlarged lymph nodes are identified. Abdominal aorta and its major branch vessels are patent. No acute osseous abnormality is identified. Mild facet arthrosis is noted in the lower lumbar spine.  IMPRESSION: 1. Bilateral upper lobe ground-glass opacities, concerning for multifocal infection or possibly contusion given the possibility of trauma. 2. No acute abnormality identified in the abdomen or pelvis. 3. Small bilateral hypodense renal lesions, possibly cysts but indeterminate on this study. Consider further evaluation with nonemergent abdominal MRI after the patient's acute illness has resolved.   Electronically Signed   By: Logan Bores   On: 04/20/2014 13:58   Dg Pelvis Portable  04/20/2014   CLINICAL DATA:  found down  EXAM: PORTABLE PELVIS 1-2 VIEWS  COMPARISON:  03/17/2019 2006  FINDINGS: There is no evidence of pelvic fracture or diastasis. No other pelvic bone lesions are seen. Bilateral pelvic calcifications which were not present on the prior film.  IMPRESSION: 1. Negative for fracture. 2. New bilateral pelvic calcifications.    Electronically Signed   By: Arne Cleveland M.D.   On: 04/20/2014 10:50   Dg Chest Portable 1 View  04/20/2014   CLINICAL DATA:  found down  EXAM: PORTABLE CHEST - 1 VIEW  COMPARISON:  03/16/2014  FINDINGS: New interstitial and airspace opacities in both upper lobes. Lung bases remain clear. Heart size upper limits normal for technique. No effusion. Visualized skeletal structures are unremarkable.  IMPRESSION: 1. Bilateral upper lobe infiltrates, new since prior study.   Electronically Signed   By: Arne Cleveland M.D.   On: 04/20/2014 10:45       ASSESSMENT / PLAN:  PULMONARY A: At Risk for Acute Respiratory Failure - in setting of altered mental status r/t cocaine abuse Aspiration PNA v Cocaine lung  P:   Monitor respiratory status closely  Pulmonary hygiene as able Minimize sedating medications F/u CXR in am   CARDIOVASCULAR A:  Tachycardia - secondary to cocaine abuse, dehydration  P:  ICU monitoring Assess EKG in am  Trend troponin Hydration / NS at [id number] ml/hr   METABOLIC A: Rhabdomyolysis - severe and Lactic Acidosis due to immobility, possible fall and cocaine and +/- heat. AT risk for possible compartment syndrome of back/ lower extremity P Aggressive hydration Monitor CK, lactate closely  RENAL A:   Hyponatremia  Anticipate AKI - with contrast media + rhabdo  P:   CMP @ 2000 with CK Serial CK  Additional 2L NS (6 total) Repeat lactic acid  GASTROINTESTINAL / GU  A:   At risk further aspiration Urinary Retention - ? If drug related  P:   NPO HOB flat until MRI reviewed, if negative up to 30 degrees  Foley catheter  HEMATOLOGIC A:   No Acute Issues  P:  Monitor CBC DVT Proph: SQ Heparin   INFECTIOUS A:   Leukocytosis  Aspiration PNA v Cocaine lung (last use 04/19/14) P:   Zosyn for aspiration given recent hospitalization (PSY) Monitor fever curve / leukocytosis Follow blood cultures  ENDOCRINE A:   DM / Hyperglycemia P:   SSI    NEUROLOGIC A:   Hx of seizures Acute lower extremity weakness - normal rectal tone on exam. Due to severe gluteal, pelvic musculature and erector spinae rhabdomyolysis. At risk of compartment syndrome of gluteal  musculature   P:   RASS goal: n/a DC C Collar - no c spine tenderness, awake, Normal T spine MRI Normal C spine CT)  DC log rol  No AED's listed as home medication, mother does not think she takes any   Noe Gens, NP-C [facility] Pgr: [phone] or [phone] 04/20/2014, 2:43 PM   STAFF NOTE: I, Dr Ann Lions have personally reviewed patient's available data, including medical history, events of note, physical examination and test results as part of my evaluation. I have discussed with resident/NP and other care providers such as pharmacist, RN and RRT.  In addition,  I personally evaluated patient and elicited key findings of cocaine toxicity with severe rhabdo and lactic acidosis of gluteal and pelvic muscularture along with back musculature resulting in paraparesis (power 1-2/5). Will aggressively hydrate. Will watch CK, lactate and motor strength closely. AT risk of compartment syndrome ? Bladder retention due to this. IF worsens, will have to call [facility].  Rest per NP/medical resident whose note is outlined above and that I agree with  The patient is critically ill with multiple organ systems failure and requires high complexity decision making for assessment and support, frequent evaluation and titration of therapies, application of advanced monitoring technologies and extensive interpretation of multiple databases.   Critical Care Time devoted to patient care services described in this note is  45  Minutes.  Dr. Brand Males, M.D., [facility].C.P Pulmonary and Critical Care Medicine Staff Physician [place] [facility] Pager: [phone], If no answer or between  15:00h - 7:00h: call [phone]  04/20/2014 6:01  PM

## 2014-04-20 NOTE — ED Notes (Signed)
Critical care at bedside  

## 2014-04-21 ENCOUNTER — Inpatient Hospital Stay (HOSPITAL_COMMUNITY): Payer: Medicaid Other

## 2014-04-21 DIAGNOSIS — R404 Transient alteration of awareness: Secondary | ICD-10-CM

## 2014-04-21 LAB — PROCALCITONIN: Procalcitonin: 0.19 ng/mL

## 2014-04-21 LAB — COMPREHENSIVE METABOLIC PANEL
ALT: 107 U/L — ABNORMAL HIGH (ref 0–35)
AST: 257 U/L — ABNORMAL HIGH (ref 0–37)
Albumin: 2.1 g/dL — ABNORMAL LOW (ref 3.5–5.2)
Alkaline Phosphatase: 66 U/L (ref 39–117)
BUN: 7 mg/dL (ref 6–23)
CO2: 22 mEq/L (ref 19–32)
Calcium: 7.5 mg/dL — ABNORMAL LOW (ref 8.4–10.5)
Chloride: 101 mEq/L (ref 96–112)
Creatinine, Ser: 0.51 mg/dL (ref 0.50–1.10)
GFR calc Af Amer: >90 mL/min (ref >90)
GFR calc non Af Amer: >90 mL/min (ref >90)
Glucose, Bld: 135 mg/dL — ABNORMAL HIGH (ref 70–99)
Potassium: 3.5 mEq/L — ABNORMAL LOW (ref 3.7–5.3)
Sodium: 137 mEq/L (ref 137–147)
Total Bilirubin: 0.2 mg/dL — ABNORMAL LOW (ref 0.3–1.2)
Total Protein: 5.5 g/dL — ABNORMAL LOW (ref 6.0–8.3)

## 2014-04-21 LAB — CBC
HCT: 43.5 % (ref 36.0–46.0)
Hemoglobin: 14.7 g/dL (ref 12.0–15.0)
MCH: 32 pg (ref 26.0–34.0)
MCHC: 33.8 g/dL (ref 30.0–36.0)
MCV: 94.6 fL (ref 78.0–100.0)
Platelets: 278 10*3/uL (ref 150–400)
RBC: 4.6 MIL/uL (ref 3.87–5.11)
RDW: 13.5 % (ref 11.5–15.5)
WBC: 11.1 10*3/uL — ABNORMAL HIGH (ref 4.0–10.5)

## 2014-04-21 LAB — GLUCOSE, CAPILLARY
Glucose-Capillary: 140 mg/dL — ABNORMAL HIGH (ref 70–99)
Glucose-Capillary: 144 mg/dL — ABNORMAL HIGH (ref 70–99)
Glucose-Capillary: 156 mg/dL — ABNORMAL HIGH (ref 70–99)
Glucose-Capillary: 163 mg/dL — ABNORMAL HIGH (ref 70–99)
Glucose-Capillary: 169 mg/dL — ABNORMAL HIGH (ref 70–99)
Glucose-Capillary: 207 mg/dL — ABNORMAL HIGH (ref 70–99)

## 2014-04-21 LAB — TROPONIN I
Troponin I: 0.33 ng/mL (ref <0.30)
Troponin I: 0.59 ng/mL (ref <0.30)
Troponin I: 0.6 ng/mL (ref <0.30)

## 2014-04-21 LAB — MAGNESIUM: Magnesium: 1.9 mg/dL (ref 1.5–2.5)

## 2014-04-21 LAB — PRO B NATRIURETIC PEPTIDE: Pro B Natriuretic peptide (BNP): 4365 pg/mL — ABNORMAL HIGH (ref 0–125)

## 2014-04-21 LAB — PHOSPHORUS: Phosphorus: 2.5 mg/dL (ref 2.3–4.6)

## 2014-04-21 LAB — CK: Total CK: 32031 U/L — ABNORMAL HIGH (ref 7–177)

## 2014-04-21 MED ORDER — ASPIRIN EC 81 MG PO TBEC
81.0000 mg | DELAYED_RELEASE_TABLET | Freq: Every day | ORAL | Status: DC
  Administered 2014-04-21 – 2014-04-24 (×4): 81 mg via ORAL
  Filled 2014-04-21 (×4): qty 1

## 2014-04-21 MED ORDER — FENTANYL CITRATE 0.05 MG/ML IJ SOLN
12.5000 ug | INTRAMUSCULAR | Status: DC | PRN
  Administered 2014-04-21 – 2014-04-23 (×16): 12.5 ug via INTRAVENOUS
  Filled 2014-04-21 (×15): qty 2

## 2014-04-21 MED ORDER — SODIUM CHLORIDE 0.9 % IV BOLUS (SEPSIS)
750.0000 mL | Freq: Once | INTRAVENOUS | Status: AC
  Administered 2014-04-21: 15:00:00 via INTRAVENOUS

## 2014-04-21 MED ORDER — POTASSIUM CHLORIDE CRYS ER 20 MEQ PO TBCR
40.0000 meq | EXTENDED_RELEASE_TABLET | Freq: Once | ORAL | Status: AC
Start: 2014-04-21 — End: 2014-04-21
  Administered 2014-04-21: 40 meq via ORAL
  Filled 2014-04-21: qty 2

## 2014-04-21 NOTE — Progress Notes (Signed)
UR Completed.  Jt [place] Jane [phone] 04/21/2014  

## 2014-04-21 NOTE — H&P (Signed)
PULMONARY / CRITICAL CARE MEDICINE   Name: MALYNDA LUMBRERAS MRN: [id number] DOB: 06-02-1976    ADMISSION DATE:  04/20/2014  REFERRING MD :  Dr. Wyvonnia Dusky  PRIMARY SERVICE: PCCM   CHIEF COMPLAINT:  AMS  BRIEF PATIENT DESCRIPTION: 38 y/o F, smoker, with PMH of DM, HTN, Seizures, polysubstance abuse, multiple admissions for substance abuse and involuntary commitment admitted on 6/28 after being found down in yard.  Found to have elevated CK, glucose, lactic acid and CXR concerning for aspiration.  UDS positive for cocaine.  Patient unable to move lower extremities.    SIGNIFICANT EVENTS and STUDIES:   6/28 - Admit after being found down - elevated CK, glucose, & lactic acid.  CXR concerning for aspiration.  UDS positive for cocaine, pt with no memory of events.   6/28 CT Head>>>neg for ICH but has ethmoid and maxillary sinusitis 6/28 CT C-Spine>>>no cervical fx 6/26 CT Chest >>>bilateral upper GGO 6/28 CT ABD/Pelvis>>>no acute abnormality, small bilateral renal lesions (? Cysts) 6/28 - MRI T spine: normal 6/28 - MRI L spine- no stenosis, severe gluteal, pelvic and erector spinae mysositis (pulses in lowers are dopplered +) 6/29- improved strength  LINES / TUBES:  CULTURES: BCx2 >>>  ANTIBIOTICS: Zosyn 6/28 (empiric aspiration, recent admit)>>>  SUBJECTIVE: moves ext better  VITAL SIGNS: Temp:  [98.6 F (37 C)-100.6 F (38.1 C)] 100.3 F (37.9 C) (06/29 1000) Pulse Rate:  [116-137] 134 (06/29 1000) Resp:  [0-26] 18 (06/29 1000) BP: (105-122)/(68-89) 115/84 mmHg (06/29 1000) SpO2:  [97 %-100 %] 98 % (06/29 1000) Weight:  [79.379 kg (175 lb)-103 kg (227 lb 1.2 oz)] 103 kg (227 lb 1.2 oz) (06/29 0400) HEMODYNAMICS:   VENTILATOR SETTINGS:   INTAKE / OUTPUT: Intake/Output     06/28 0701 - 06/29 0700 06/29 0701 - 06/30 0700   I.V. (mL/kg) 2400 (23.3)    IV Piggyback 2062.5    Total Intake(mL/kg) 4462.5 (43.3)    Urine (mL/kg/hr) 4295    Total Output 4295     Net +167.5             PHYSICAL EXAMINATION: General: awake, pain reported Neuro: alert, moves uppers 5/5, lower 1/5 but improved, perrl, A  o x 3, reflex wnl HEENT: jvd PULM: reduced CV: s1 s2 RRT GI: soft, bs well no r Extremities: no rash   LABS: - updaetd 6pm 04/20/14 PULMONARY  Recent Labs Lab 04/20/14 1034 04/20/14 1150  PHART  --  7.383  PCO2ART  --  33.6*  PO2ART  --  83.0  HCO3  --  20.2  TCO2 19 21  O2SAT  --  96.0    CBC  Recent Labs Lab 04/20/14 1020 04/20/14 1034 04/20/14 1850 04/21/14 0641  HGB 16.9* 18.0* 14.4 14.7  HCT 47.9* 53.0* 41.0 43.5  WBC 16.8*  --  10.5 11.1*  PLT 386  --  331 278    COAGULATION  Recent Labs Lab 04/20/14 1020  INR 0.99    CARDIAC    Recent Labs Lab 04/20/14 1850 04/21/14 0031 04/21/14 0641 04/21/14 0913  TROPONINI 0.85* 0.33* 0.59* 0.60*    Recent Labs Lab 04/20/14 1850 04/21/14 0641  PROBNP 4466.0* 4365.0*     CHEMISTRY  Recent Labs Lab 04/20/14 1020 04/20/14 1034 04/20/14 1850 04/21/14 0641  NA 134* 135* 140  139 137  K 5.0 4.8 4.6  4.3 3.5*  CL 92* 98 101  99 101  CO2 19  --  21  21 22   GLUCOSE 375*  389* 260*  259* 135*  BUN 10 9 8  8 7   CREATININE 0.53 0.70 0.60  0.55 0.51  CALCIUM 8.5  --  7.4*  7.3* 7.5*  MG  --   --   --  1.9  PHOS  --   --   --  2.5   Estimated Creatinine Clearance: 113.5 ml/min (by C-G formula based on Cr of 0.51).   LIVER  Recent Labs Lab 04/20/14 1020 04/20/14 1850 04/21/14 0641  AST 223* 299* 257*  ALT 75* 107* 107*  ALKPHOS 79 68 66  BILITOT 0.3 0.2* 0.2*  PROT 7.3 6.4 5.5*  ALBUMIN 3.4* 2.7* 2.1*  INR 0.99  --   --      INFECTIOUS  Recent Labs Lab 04/20/14 1034 04/20/14 1850 04/21/14 0641  LATICACIDVEN 7.06* 2.8*  --   PROCALCITON  --  0.29 0.19     ENDOCRINE CBG (last 3)   Recent Labs  04/21/14 0346 04/21/14 0757 04/21/14 1254  GLUCAP 163* 144* 207*         IMAGING x48h  Ct Head Wo Contrast  04/20/2014   CLINICAL  DATA:  Altered mental status  EXAM: CT HEAD WITHOUT CONTRAST  CT CERVICAL SPINE WITHOUT CONTRAST  TECHNIQUE: Multidetector CT imaging of the head and cervical spine was performed following the standard protocol without intravenous contrast. Multiplanar CT image reconstructions of the cervical spine were also generated.  COMPARISON:  06/26/2008  FINDINGS: CT HEAD FINDINGS  No skull fracture is noted. No intracranial hemorrhage, mass effect or midline shift. There is mild mucosal thickening with air-fluid level posterior aspect bilateral maxillary sinus. The mastoid air cells are unremarkable.  No hydrocephalus. No intra or extra-axial fluid collection. No acute cortical infarction. No mass lesion is noted on this unenhanced scan. Mild mucosal thickening with partial opacification bilateral ethmoid air cells.  CT CERVICAL SPINE FINDINGS  Axial images of the cervical spine shows no acute fracture or subluxation. Computer processed images shows no acute fracture or subluxation. Alignment, disc spaces and vertebral heights are preserved.  There is no pneumothorax in visualized lung apices.  IMPRESSION: 1. No acute intracranial abnormality. 2. Mild mucosal thickening with air-fluid level bilateral maxillary sinus. Mild mucosal thickening with partial opacification bilateral ethmoid air cells. 3. No cervical spine acute fracture or subluxation. No prevertebral soft tissue swelling. Cervical airway is patent.   Electronically Signed   By: Lahoma Crocker M.D.   On: 04/20/2014 13:46   Ct Chest W Contrast  04/20/2014   CLINICAL DATA:  Possible trauma.  Altered mental status.  EXAM: CT CHEST, ABDOMEN, AND PELVIS WITH CONTRAST  TECHNIQUE: Multidetector CT imaging of the chest, abdomen and pelvis was performed following the standard protocol during bolus administration of intravenous contrast.  CONTRAST:  117mL OMNIPAQUE IOHEXOL 300 MG/ML  SOLN  COMPARISON:  Chest radiograph earlier today  FINDINGS: CT CHEST FINDINGS  There is no  evidence of great vessel injury. 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 The heart is normal in size. No enlarged axillary, mediastinal, or hilar lymph nodes are seen. There is no pleural or pericardial effusion. There is no pneumothorax. Ground-glass opacities are present in both upper lobes. Minimal subpleural opacity dependently in the lower lobes likely represents atelectasis. No acute osseous abnormality is identified in the chest.  CT ABDOMEN AND PELVIS FINDINGS  The gallbladder is surgically absent. The liver, spleen, adrenal glands, and pancreas have an unremarkable enhanced appearance. Hypodense renal lesions measure 1.2 cm  in the right upper pole and 8 mm in the interpolar region on the left.  A Foley catheter is present in the bladder, which is decompressed. Uterus and ovaries are grossly unremarkable. The bowel is grossly unremarkable. No free fluid or enlarged lymph nodes are identified. Abdominal aorta and its major branch vessels are patent. No acute osseous abnormality is identified. Mild facet arthrosis is noted in the lower lumbar spine.  IMPRESSION: 1. Bilateral upper lobe ground-glass opacities, concerning for multifocal infection or possibly contusion given the possibility of trauma. 2. No acute abnormality identified in the abdomen or pelvis. 3. Small bilateral hypodense renal lesions, possibly cysts but indeterminate on this study. Consider further evaluation with nonemergent abdominal MRI after the patient's acute illness has resolved.   Electronically Signed   By: Logan Bores   On: 04/20/2014 13:58   Ct Cervical Spine Wo Contrast  04/20/2014   CLINICAL DATA:  Altered mental status  EXAM: CT HEAD WITHOUT CONTRAST  CT CERVICAL SPINE WITHOUT CONTRAST  TECHNIQUE: Multidetector CT imaging of the head and cervical spine was performed following the standard protocol without intravenous contrast. Multiplanar CT image reconstructions of the cervical spine were also  generated.  COMPARISON:  06/26/2008  FINDINGS: CT HEAD FINDINGS  No skull fracture is noted. No intracranial hemorrhage, mass effect or midline shift. There is mild mucosal thickening with air-fluid level posterior aspect bilateral maxillary sinus. The mastoid air cells are unremarkable.  No hydrocephalus. No intra or extra-axial fluid collection. No acute cortical infarction. No mass lesion is noted on this unenhanced scan. Mild mucosal thickening with partial opacification bilateral ethmoid air cells.  CT CERVICAL SPINE FINDINGS  Axial images of the cervical spine shows no acute fracture or subluxation. Computer processed images shows no acute fracture or subluxation. Alignment, disc spaces and vertebral heights are preserved.  There is no pneumothorax in visualized lung apices.  IMPRESSION: 1. No acute intracranial abnormality. 2. Mild mucosal thickening with air-fluid level bilateral maxillary sinus. Mild mucosal thickening with partial opacification bilateral ethmoid air cells. 3. No cervical spine acute fracture or subluxation. No prevertebral soft tissue swelling. Cervical airway is patent.   Electronically Signed   By: Lahoma Crocker M.D.   On: 04/20/2014 13:46   Mr Thoracic Spine Wo Contrast  04/20/2014   CLINICAL DATA:  38 year old female found down, lower extremity weakness and urinary retention of unknown etiology. Initial encounter.  EXAM: MRI THORACIC AND LUMBAR SPINE WITHOUT CONTRAST  TECHNIQUE: Multiplanar and multiecho pulse sequences of the thoracic and lumbar spine were obtained without intravenous contrast.  COMPARISON:  Lumbar MRI 07/30/2004. CT Chest, Abdomen and Pelvis 04/20/2014.  FINDINGS: MR THORACIC SPINE FINDINGS  Limited sagittal imaging of the cervical spine is unremarkable.  Large body habitus. Normal thoracic vertebral height and alignment. No marrow edema or evidence of acute osseous abnormality.  Normal thoracic spinal canal patency. Spinal cord signal is within normal limits at all  visualized levels. Conus medullaris described with lumbar findings.  No thoracic disc herniation. There is mild to moderate thoracic facet hypertrophy from T7-T8 T11-T12. Subsequent mild right T9 and T10 neural foraminal stenosis.  Abnormal signal in the lung apices, see recent chest CT. Visualized posterior paraspinal soft tissues are within normal limits. Negative visualized upper abdominal viscera.  MR LUMBAR SPINE FINDINGS  Normal lumbar segmentation, congruent with the thoracic numbering system.  Visualized lower thoracic spinal cord is normal with conus medularis at L1-L2.  Stable and normal lumbar vertebral height and alignment. No marrow edema  or evidence of acute osseous abnormality.  Confluent abnormal signal and swelling of the bilateral erector spinae muscles from the L3 level to the sacrum, left greater than right. No associated fluid collection. No edema in knee adjacent posterior elements. There is a comparatively small volume of overlying subcutaneous fat edema.  Furthermore, coronal and oblique coronal imaging was performed demonstrating confluent Severe bilateral muscle edema also in the visible gluteus (series 19, image 20) and other deep adductor muscles (piriformis right greater than left). No fluid collection identified within the visible affected areas.  No lumbar disc degeneration or stenosis at the L3-L4 level and above.  L4-L5: Disc desiccation and circumferential disc bulge. Moderate facet hypertrophy. Small central annular fissure. No significant stenosis.  L5-S1: Disc desiccation with small right paracentral disc protrusion. Mild facet hypertrophy. Epidural lipomatosis. Conjoined right L5-S1 nerve roots such that there is moderate right lateral recess stenosis affecting the right S1 roots. No other stenosis.  IMPRESSION: MR LUMBAR SPINE IMPRESSION  1. Confluent severe muscle edema about the lower pelvis, affecting gluteal muscles and adductors. Separate but similar involvement of the  lower lumbar erector spinae muscles. Considering the patient was found down, this may reflect severe myositis in the setting of generalized rhabdomyolysis. Infectious myositis is the main differential consideration. 2. No lumbar spinal stenosis. Mild lower lumbar degenerative changes only affecting the right S1 nerve roots in the lateral recess.  MR THORACIC SPINE IMPRESSION  1. Normal thoracic spine and spinal cord, aside from lower thoracic facet degeneration. 2. Abnormal signal in the upper lungs, see chest CT from earlier today.   Electronically Signed   By: Lars Pinks M.D.   On: 04/20/2014 17:31   Mr Lumbar Spine Wo Contrast  04/20/2014   CLINICAL DATA:  38 year old female found down, lower extremity weakness and urinary retention of unknown etiology. Initial encounter.  EXAM: MRI THORACIC AND LUMBAR SPINE WITHOUT CONTRAST  TECHNIQUE: Multiplanar and multiecho pulse sequences of the thoracic and lumbar spine were obtained without intravenous contrast.  COMPARISON:  Lumbar MRI 07/30/2004. CT Chest, Abdomen and Pelvis 04/20/2014.  FINDINGS: MR THORACIC SPINE FINDINGS  Limited sagittal imaging of the cervical spine is unremarkable.  Large body habitus. Normal thoracic vertebral height and alignment. No marrow edema or evidence of acute osseous abnormality.  Normal thoracic spinal canal patency. Spinal cord signal is within normal limits at all visualized levels. Conus medullaris described with lumbar findings.  No thoracic disc herniation. There is mild to moderate thoracic facet hypertrophy from T7-T8 T11-T12. Subsequent mild right T9 and T10 neural foraminal stenosis.  Abnormal signal in the lung apices, see recent chest CT. Visualized posterior paraspinal soft tissues are within normal limits. Negative visualized upper abdominal viscera.  MR LUMBAR SPINE FINDINGS  Normal lumbar segmentation, congruent with the thoracic numbering system.  Visualized lower thoracic spinal cord is normal with conus medularis at  L1-L2.  Stable and normal lumbar vertebral height and alignment. No marrow edema or evidence of acute osseous abnormality.  Confluent abnormal signal and swelling of the bilateral erector spinae muscles from the L3 level to the sacrum, left greater than right. No associated fluid collection. No edema in knee adjacent posterior elements. There is a comparatively small volume of overlying subcutaneous fat edema.  Furthermore, coronal and oblique coronal imaging was performed demonstrating confluent Severe bilateral muscle edema also in the visible gluteus (series 19, image 20) and other deep adductor muscles (piriformis right greater than left). No fluid collection identified within the visible affected areas.  No lumbar disc degeneration or stenosis at the L3-L4 level and above.  L4-L5: Disc desiccation and circumferential disc bulge. Moderate facet hypertrophy. Small central annular fissure. No significant stenosis.  L5-S1: Disc desiccation with small right paracentral disc protrusion. Mild facet hypertrophy. Epidural lipomatosis. Conjoined right L5-S1 nerve roots such that there is moderate right lateral recess stenosis affecting the right S1 roots. No other stenosis.  IMPRESSION: MR LUMBAR SPINE IMPRESSION  1. Confluent severe muscle edema about the lower pelvis, affecting gluteal muscles and adductors. Separate but similar involvement of the lower lumbar erector spinae muscles. Considering the patient was found down, this may reflect severe myositis in the setting of generalized rhabdomyolysis. Infectious myositis is the main differential consideration. 2. No lumbar spinal stenosis. Mild lower lumbar degenerative changes only affecting the right S1 nerve roots in the lateral recess.  MR THORACIC SPINE IMPRESSION  1. Normal thoracic spine and spinal cord, aside from lower thoracic facet degeneration. 2. Abnormal signal in the upper lungs, see chest CT from earlier today.   Electronically Signed   By: Lars Pinks  M.D.   On: 04/20/2014 17:31   Ct Abdomen Pelvis W Contrast  04/20/2014   CLINICAL DATA:  Possible trauma.  Altered mental status.  EXAM: CT CHEST, ABDOMEN, AND PELVIS WITH CONTRAST  TECHNIQUE: Multidetector CT imaging of the chest, abdomen and pelvis was performed following the standard protocol during bolus administration of intravenous contrast.  CONTRAST:  144mL OMNIPAQUE IOHEXOL 300 MG/ML  SOLN  COMPARISON:  Chest radiograph earlier today  FINDINGS: CT CHEST FINDINGS  There is no evidence of great vessel injury. 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 The heart is normal in size. No enlarged axillary, mediastinal, or hilar lymph nodes are seen. There is no pleural or pericardial effusion. There is no pneumothorax. Ground-glass opacities are present in both upper lobes. Minimal subpleural opacity dependently in the lower lobes likely represents atelectasis. No acute osseous abnormality is identified in the chest.  CT ABDOMEN AND PELVIS FINDINGS  The gallbladder is surgically absent. The liver, spleen, adrenal glands, and pancreas have an unremarkable enhanced appearance. Hypodense renal lesions measure 1.2 cm in the right upper pole and 8 mm in the interpolar region on the left.  A Foley catheter is present in the bladder, which is decompressed. Uterus and ovaries are grossly unremarkable. The bowel is grossly unremarkable. No free fluid or enlarged lymph nodes are identified. Abdominal aorta and its major branch vessels are patent. No acute osseous abnormality is identified. Mild facet arthrosis is noted in the lower lumbar spine.  IMPRESSION: 1. Bilateral upper lobe ground-glass opacities, concerning for multifocal infection or possibly contusion given the possibility of trauma. 2. No acute abnormality identified in the abdomen or pelvis. 3. Small bilateral hypodense renal lesions, possibly cysts but indeterminate on this study. Consider further evaluation with nonemergent abdominal  MRI after the patient's acute illness has resolved.   Electronically Signed   By: Logan Bores   On: 04/20/2014 13:58   Dg Pelvis Portable  04/20/2014   CLINICAL DATA:  found down  EXAM: PORTABLE PELVIS 1-2 VIEWS  COMPARISON:  03/17/2019 2006  FINDINGS: There is no evidence of pelvic fracture or diastasis. No other pelvic bone lesions are seen. Bilateral pelvic calcifications which were not present on the prior film.  IMPRESSION: 1. Negative for fracture. 2. New bilateral pelvic calcifications.   Electronically Signed   By: Arne Cleveland M.D.   On: 04/20/2014 10:50   Dg  Chest Port 1 View  04/21/2014   CLINICAL DATA:  Seizure.  Tachycardia.  EXAM: PORTABLE CHEST - 1 VIEW  COMPARISON:  CT and single view of the chest 04/12/2014.  FINDINGS: Bilateral upper lobe airspace disease seen on the comparison studies is markedly improved. The lungs now appear clear. Heart size is normal. No pneumothorax or pleural effusion.  IMPRESSION: No acute finding. Bilateral upper lobe airspace disease has resolved.   Electronically Signed   By: Inge Rise M.D.   On: 04/21/2014 07:40   Dg Chest Portable 1 View  04/20/2014   CLINICAL DATA:  found down  EXAM: PORTABLE CHEST - 1 VIEW  COMPARISON:  03/16/2014  FINDINGS: New interstitial and airspace opacities in both upper lobes. Lung bases remain clear. Heart size upper limits normal for technique. No effusion. Visualized skeletal structures are unremarkable.  IMPRESSION: 1. Bilateral upper lobe infiltrates, new since prior study.   Electronically Signed   By: Arne Cleveland M.D.   On: 04/20/2014 10:45       ASSESSMENT / PLAN:  PULMONARY A: At Risk for Acute Respiratory Failure - in setting of altered mental status r/t cocaine abuse Aspiration PNA v Cocaine lung (unlikely) ATX P:   CT unimpressive pcxr in am  RA IS  CARDIOVASCULAR A:  Tachycardia - secondary to cocaine abuse, dehydration  ST P:  ICU monitoring Trend troponin noted - rhabdo likely,  at risk coc induced ischemia, asa echo Hydration / NS at 200 ml/hr Avoid BB with risk unopposed alpha  METABOLIC A: Rhabdomyolysis - severe and Lactic Acidosis due to immobility, possible fall and cocaine and +/- heat. AT risk for possible compartment syndrome of back/ lower extremity P Aggressive hydration No lasix at this stage, improving without  RENAL A:   Hyponatremia  Anticipate AKI - with contrast media + rhabdo  P:   Continued fluids Chem in am   GASTROINTESTINAL / GU  A:   At risk further aspiration Urinary Retention - ? If drug related  P:   diet Foley catheter  HEMATOLOGIC A:   No Acute Issues  P:  Monitor CBC DVT Proph: SQ Heparin   INFECTIOUS A:   Leukocytosis  NO evidence for Aspiration PNA v Cocaine lung (last use 04/19/14) P:   Zosyn for aspiration, dc and observe, good clinical status, CT unimpressive, pct neg x 2 Monitor fever curve / leukocytosis Follow blood cultures  ENDOCRINE A:   DM / Hyperglycemia P:   SSI   NEUROLOGIC A:   Hx of seizures Acute lower extremity weakness - normal rectal tone on exam. Due to severe gluteal, pelvic musculature and erector spinae rhabdomyolysis. At risk of compartment syndrome of gluteal musculature Myositis?   P:   RASS goal: n/a MRi neg No role steroids May need neuro if not progressing further  Lavon Paganini. Titus Mould, MD, [place] Pgr: [facility]

## 2014-04-21 NOTE — Progress Notes (Signed)
Inpatient Diabetes Program Recommendations  AACE/ADA: New Consensus Statement on Inpatient Glycemic Control (2013)  Target Ranges:  Prepandial:   less than 140 mg/dL      Peak postprandial:   less than 180 mg/dL (1-2 hours)      Critically ill patients:  140 - 180 mg/dL   Reason for Visit: Note that CBG > 300 mg/dL on admit.  No previous history of diabetes noted.  May consider HgBA1C to determine if hyperglycemia was present 2-3 months prior to admit.  If fasting CBG's remain greater than 150 mg/dL, may consider adding basal insulin such as Lantus 15 units daily.    Will follow.    Thanks, Adah Perl, RN, BC-ADM Inpatient Diabetes Coordinator Pager [phone]

## 2014-04-21 NOTE — Progress Notes (Signed)
CRITICAL VALUE ALERT  Critical value received:  Troponin 0.59  Date of notification:  04/21/14  Time of notification:  [id number]  Critical value read back: yes  Nurse who received alert:  Soyla Dryer, RN   MD notified (1st page):  Ramaswamy  Time of first page:  [phone]  MD notified (2nd page): n/a  Time of second page: n/a   Dr. Chase Caller stated he will pass on critical value to Dr. Titus Mould.   Responding MD:    Time MD responded:  [id number]

## 2014-04-22 ENCOUNTER — Inpatient Hospital Stay (HOSPITAL_COMMUNITY): Payer: Medicaid Other

## 2014-04-22 LAB — CBC WITH DIFFERENTIAL/PLATELET
Basophils Absolute: 0 10*3/uL (ref 0.0–0.1)
Basophils Relative: 0 % (ref 0–1)
Eosinophils Absolute: 0 10*3/uL (ref 0.0–0.7)
Eosinophils Relative: 0 % (ref 0–5)
HCT: 40.9 % (ref 36.0–46.0)
Hemoglobin: 13.6 g/dL (ref 12.0–15.0)
Lymphocytes Relative: 19 % (ref 12–46)
Lymphs Abs: 1.7 10*3/uL (ref 0.7–4.0)
MCH: 31.8 pg (ref 26.0–34.0)
MCHC: 33.3 g/dL (ref 30.0–36.0)
MCV: 95.6 fL (ref 78.0–100.0)
Monocytes Absolute: 0.9 10*3/uL (ref 0.1–1.0)
Monocytes Relative: 10 % (ref 3–12)
Neutro Abs: 6.6 10*3/uL (ref 1.7–7.7)
Neutrophils Relative %: 71 % (ref 43–77)
Platelets: 228 10*3/uL (ref 150–400)
RBC: 4.28 MIL/uL (ref 3.87–5.11)
RDW: 13 % (ref 11.5–15.5)
WBC: 9.4 10*3/uL (ref 4.0–10.5)

## 2014-04-22 LAB — COMPREHENSIVE METABOLIC PANEL
ALT: 86 U/L — ABNORMAL HIGH (ref 0–35)
AST: 160 U/L — ABNORMAL HIGH (ref 0–37)
Albumin: 1.7 g/dL — ABNORMAL LOW (ref 3.5–5.2)
Alkaline Phosphatase: 84 U/L (ref 39–117)
BUN: 4 mg/dL — ABNORMAL LOW (ref 6–23)
CO2: 19 mEq/L (ref 19–32)
Calcium: 7.7 mg/dL — ABNORMAL LOW (ref 8.4–10.5)
Chloride: 106 mEq/L (ref 96–112)
Creatinine, Ser: 0.43 mg/dL — ABNORMAL LOW (ref 0.50–1.10)
GFR calc Af Amer: >90 mL/min (ref >90)
GFR calc non Af Amer: >90 mL/min (ref >90)
Glucose, Bld: 125 mg/dL — ABNORMAL HIGH (ref 70–99)
Potassium: 3.9 mEq/L (ref 3.7–5.3)
Sodium: 140 mEq/L (ref 137–147)
Total Bilirubin: 0.4 mg/dL (ref 0.3–1.2)
Total Protein: 5.3 g/dL — ABNORMAL LOW (ref 6.0–8.3)

## 2014-04-22 LAB — GLUCOSE, CAPILLARY
Glucose-Capillary: 128 mg/dL — ABNORMAL HIGH (ref 70–99)
Glucose-Capillary: 131 mg/dL — ABNORMAL HIGH (ref 70–99)
Glucose-Capillary: 142 mg/dL — ABNORMAL HIGH (ref 70–99)
Glucose-Capillary: 172 mg/dL — ABNORMAL HIGH (ref 70–99)
Glucose-Capillary: 197 mg/dL — ABNORMAL HIGH (ref 70–99)
Glucose-Capillary: 240 mg/dL — ABNORMAL HIGH (ref 70–99)

## 2014-04-22 LAB — PHOSPHORUS: Phosphorus: 2.2 mg/dL — ABNORMAL LOW (ref 2.3–4.6)

## 2014-04-22 LAB — PROCALCITONIN: Procalcitonin: 0.14 ng/mL

## 2014-04-22 LAB — MAGNESIUM: Magnesium: 1.8 mg/dL (ref 1.5–2.5)

## 2014-04-22 MED ORDER — POTASSIUM PHOSPHATES 15 MMOLE/5ML IV SOLN
10.0000 mmol | Freq: Once | INTRAVENOUS | Status: AC
  Administered 2014-04-22: 10 mmol via INTRAVENOUS
  Filled 2014-04-22: qty 3.33

## 2014-04-22 MED ORDER — FAMOTIDINE 20 MG PO TABS
20.0000 mg | ORAL_TABLET | Freq: Two times a day (BID) | ORAL | Status: DC
  Administered 2014-04-22 – 2014-04-28 (×12): 20 mg via ORAL
  Filled 2014-04-22 (×14): qty 1

## 2014-04-22 NOTE — Progress Notes (Signed)
PULMONARY / CRITICAL CARE MEDICINE   Name: FELITA BRANON MRN: [id number] DOB: 05/15/1976    ADMISSION DATE:  04/20/2014  REFERRING MD :  Dr. Wyvonnia Dusky  PRIMARY SERVICE: PCCM   CHIEF COMPLAINT:  AMS  BRIEF PATIENT DESCRIPTION: 38 y/o F, smoker, with PMH of DM, HTN, Seizures, polysubstance abuse, multiple admissions for substance abuse and involuntary commitment admitted on 6/28 after being found down in yard.  Found to have elevated CK, glucose, lactic acid and CXR concerning for aspiration.  UDS positive for cocaine.  Patient unable to move lower extremities.    SIGNIFICANT EVENTS and STUDIES:   6/28 - Admit after being found down - elevated CK, glucose, & lactic acid.  CXR concerning for aspiration.  UDS positive for cocaine, pt with no memory of events.   6/28 CT Head>>>neg for ICH but has ethmoid and maxillary sinusitis 6/28 CT C-Spine>>>no cervical fx 6/26 CT Chest >>>bilateral upper GGO 6/28 CT ABD/Pelvis>>>no acute abnormality, small bilateral renal lesions (? Cysts) 6/28 - MRI T spine: normal 6/28 - MRI L spine- no stenosis, severe gluteal, pelvic and erector spinae mysositis (pulses in lowers are dopplered +) 6/29- improved strength  LINES / TUBES:  CULTURES: BCx2 >>>  ANTIBIOTICS: Zosyn 6/28 (empiric aspiration, recent admit)>>>  SUBJECTIVE: stronger, HR better  VITAL SIGNS: Temp:  [99.4 F (37.4 C)-100.7 F (38.2 C)] 99.4 F (37.4 C) (06/30 1315) Pulse Rate:  [123-134] 123 (06/30 1315) Resp:  [15-24] 20 (06/30 1315) BP: (103-121)/(59-88) 113/80 mmHg (06/30 1300) SpO2:  [95 %-100 %] 99 % (06/30 1315) Weight:  [105.7 kg (233 lb 0.4 oz)] 105.7 kg (233 lb 0.4 oz) (06/30 0500) HEMODYNAMICS:   VENTILATOR SETTINGS:   INTAKE / OUTPUT: Intake/Output     06/29 0701 - 06/30 0700 06/30 0701 - 07/01 0700   I.V. (mL/kg) 3796.7 (35.9) 1200 (11.4)   IV Piggyback 750    Total Intake(mL/kg) 4546.7 (43) 1200 (11.4)   Urine (mL/kg/hr) 2000 (0.8) 1035 (1.3)   Total Output  2000 1035   Net +2546.7 +165          PHYSICAL EXAMINATION: General: awake, pain reported Neuro: alert, moves uppers 5/5, lower 1/5 but improved a bit HEENT: jvd PULM: CTA CV: s1 s2 RRT GI: soft, bs well no r Extremities: no rash   LABS: - updaetd 6pm 04/20/14 PULMONARY  Recent Labs Lab 04/20/14 1034 04/20/14 1150  PHART  --  7.383  PCO2ART  --  33.6*  PO2ART  --  83.0  HCO3  --  20.2  TCO2 19 21  O2SAT  --  96.0    CBC  Recent Labs Lab 04/20/14 1850 04/21/14 0641 04/22/14 0555  HGB 14.4 14.7 13.6  HCT 41.0 43.5 40.9  WBC 10.5 11.1* 9.4  PLT 331 278 228    COAGULATION  Recent Labs Lab 04/20/14 1020  INR 0.99    CARDIAC    Recent Labs Lab 04/20/14 1850 04/21/14 0031 04/21/14 0641 04/21/14 0913  TROPONINI 0.85* 0.33* 0.59* 0.60*    Recent Labs Lab 04/20/14 1850 04/21/14 0641  PROBNP 4466.0* 4365.0*     CHEMISTRY  Recent Labs Lab 04/20/14 1020 04/20/14 1034 04/20/14 1850 04/21/14 0641 04/22/14 0555  NA 134* 135* 140  139 137 140  K 5.0 4.8 4.6  4.3 3.5* 3.9  CL 92* 98 101  99 101 106  CO2 19  --  21  21 22 19   GLUCOSE 375* 389* 260*  259* 135* 125*  BUN 10 9 8  8 7 4*  CREATININE 0.53 0.70 0.60  0.55 0.51 0.43*  CALCIUM 8.5  --  7.4*  7.3* 7.5* 7.7*  MG  --   --   --  1.9 1.8  PHOS  --   --   --  2.5 2.2*   Estimated Creatinine Clearance: 115.1 ml/min (by C-G formula based on Cr of 0.43).   LIVER  Recent Labs Lab 04/20/14 1020 04/20/14 1850 04/21/14 0641 04/22/14 0555  AST 223* 299* 257* 160*  ALT 75* 107* 107* 86*  ALKPHOS 79 68 66 84  BILITOT 0.3 0.2* 0.2* 0.4  PROT 7.3 6.4 5.5* 5.3*  ALBUMIN 3.4* 2.7* 2.1* 1.7*  INR 0.99  --   --   --      INFECTIOUS  Recent Labs Lab 04/20/14 1034 04/20/14 1850 04/21/14 0641 04/22/14 0555  LATICACIDVEN 7.06* 2.8*  --   --   PROCALCITON  --  0.29 0.19 0.14     ENDOCRINE CBG (last 3)   Recent Labs  04/22/14 0343 04/22/14 0738 04/22/14 1312  GLUCAP  131* 142* 172*         IMAGING x48h  Mr Thoracic Spine Wo Contrast  04/20/2014   CLINICAL DATA:  38 year old female found down, lower extremity weakness and urinary retention of unknown etiology. Initial encounter.  EXAM: MRI THORACIC AND LUMBAR SPINE WITHOUT CONTRAST  TECHNIQUE: Multiplanar and multiecho pulse sequences of the thoracic and lumbar spine were obtained without intravenous contrast.  COMPARISON:  Lumbar MRI 07/30/2004. CT Chest, Abdomen and Pelvis 04/20/2014.  FINDINGS: MR THORACIC SPINE FINDINGS  Limited sagittal imaging of the cervical spine is unremarkable.  Large body habitus. Normal thoracic vertebral height and alignment. No marrow edema or evidence of acute osseous abnormality.  Normal thoracic spinal canal patency. Spinal cord signal is within normal limits at all visualized levels. Conus medullaris described with lumbar findings.  No thoracic disc herniation. There is mild to moderate thoracic facet hypertrophy from T7-T8 T11-T12. Subsequent mild right T9 and T10 neural foraminal stenosis.  Abnormal signal in the lung apices, see recent chest CT. Visualized posterior paraspinal soft tissues are within normal limits. Negative visualized upper abdominal viscera.  MR LUMBAR SPINE FINDINGS  Normal lumbar segmentation, congruent with the thoracic numbering system.  Visualized lower thoracic spinal cord is normal with conus medularis at L1-L2.  Stable and normal lumbar vertebral height and alignment. No marrow edema or evidence of acute osseous abnormality.  Confluent abnormal signal and swelling of the bilateral erector spinae muscles from the L3 level to the sacrum, left greater than right. No associated fluid collection. No edema in knee adjacent posterior elements. There is a comparatively small volume of overlying subcutaneous fat edema.  Furthermore, coronal and oblique coronal imaging was performed demonstrating confluent Severe bilateral muscle edema also in the visible gluteus  (series 19, image 20) and other deep adductor muscles (piriformis right greater than left). No fluid collection identified within the visible affected areas.  No lumbar disc degeneration or stenosis at the L3-L4 level and above.  L4-L5: Disc desiccation and circumferential disc bulge. Moderate facet hypertrophy. Small central annular fissure. No significant stenosis.  L5-S1: Disc desiccation with small right paracentral disc protrusion. Mild facet hypertrophy. Epidural lipomatosis. Conjoined right L5-S1 nerve roots such that there is moderate right lateral recess stenosis affecting the right S1 roots. No other stenosis.  IMPRESSION: MR LUMBAR SPINE IMPRESSION  1. Confluent severe muscle edema about the lower pelvis, affecting gluteal muscles and adductors. Separate but similar involvement  of the lower lumbar erector spinae muscles. Considering the patient was found down, this may reflect severe myositis in the setting of generalized rhabdomyolysis. Infectious myositis is the main differential consideration. 2. No lumbar spinal stenosis. Mild lower lumbar degenerative changes only affecting the right S1 nerve roots in the lateral recess.  MR THORACIC SPINE IMPRESSION  1. Normal thoracic spine and spinal cord, aside from lower thoracic facet degeneration. 2. Abnormal signal in the upper lungs, see chest CT from earlier today.   Electronically Signed   By: Lars Pinks M.D.   On: 04/20/2014 17:31   Mr Lumbar Spine Wo Contrast  04/20/2014   CLINICAL DATA:  39 year old female found down, lower extremity weakness and urinary retention of unknown etiology. Initial encounter.  EXAM: MRI THORACIC AND LUMBAR SPINE WITHOUT CONTRAST  TECHNIQUE: Multiplanar and multiecho pulse sequences of the thoracic and lumbar spine were obtained without intravenous contrast.  COMPARISON:  Lumbar MRI 07/30/2004. CT Chest, Abdomen and Pelvis 04/20/2014.  FINDINGS: MR THORACIC SPINE FINDINGS  Limited sagittal imaging of the cervical spine is  unremarkable.  Large body habitus. Normal thoracic vertebral height and alignment. No marrow edema or evidence of acute osseous abnormality.  Normal thoracic spinal canal patency. Spinal cord signal is within normal limits at all visualized levels. Conus medullaris described with lumbar findings.  No thoracic disc herniation. There is mild to moderate thoracic facet hypertrophy from T7-T8 T11-T12. Subsequent mild right T9 and T10 neural foraminal stenosis.  Abnormal signal in the lung apices, see recent chest CT. Visualized posterior paraspinal soft tissues are within normal limits. Negative visualized upper abdominal viscera.  MR LUMBAR SPINE FINDINGS  Normal lumbar segmentation, congruent with the thoracic numbering system.  Visualized lower thoracic spinal cord is normal with conus medularis at L1-L2.  Stable and normal lumbar vertebral height and alignment. No marrow edema or evidence of acute osseous abnormality.  Confluent abnormal signal and swelling of the bilateral erector spinae muscles from the L3 level to the sacrum, left greater than right. No associated fluid collection. No edema in knee adjacent posterior elements. There is a comparatively small volume of overlying subcutaneous fat edema.  Furthermore, coronal and oblique coronal imaging was performed demonstrating confluent Severe bilateral muscle edema also in the visible gluteus (series 19, image 20) and other deep adductor muscles (piriformis right greater than left). No fluid collection identified within the visible affected areas.  No lumbar disc degeneration or stenosis at the L3-L4 level and above.  L4-L5: Disc desiccation and circumferential disc bulge. Moderate facet hypertrophy. Small central annular fissure. No significant stenosis.  L5-S1: Disc desiccation with small right paracentral disc protrusion. Mild facet hypertrophy. Epidural lipomatosis. Conjoined right L5-S1 nerve roots such that there is moderate right lateral recess stenosis  affecting the right S1 roots. No other stenosis.  IMPRESSION: MR LUMBAR SPINE IMPRESSION  1. Confluent severe muscle edema about the lower pelvis, affecting gluteal muscles and adductors. Separate but similar involvement of the lower lumbar erector spinae muscles. Considering the patient was found down, this may reflect severe myositis in the setting of generalized rhabdomyolysis. Infectious myositis is the main differential consideration. 2. No lumbar spinal stenosis. Mild lower lumbar degenerative changes only affecting the right S1 nerve roots in the lateral recess.  MR THORACIC SPINE IMPRESSION  1. Normal thoracic spine and spinal cord, aside from lower thoracic facet degeneration. 2. Abnormal signal in the upper lungs, see chest CT from earlier today.   Electronically Signed   By: Lezlie Octave.D.  On: 04/20/2014 17:31   Dg Chest Port 1 View  04/22/2014   CLINICAL DATA:  Assess edema  EXAM: PORTABLE CHEST - 1 VIEW  COMPARISON:  04/21/2014  FINDINGS: Heart size and pulmonary vascularity are within normal limits.  The right lung remains clear. Question mild left lower lobe atelectasis/infiltrate and small left effusion. Slight increase in left lower lobe opacity compared with yesterday.  IMPRESSION: Question early infiltrate or atelectasis in the left lower lobe.   Electronically Signed   By: Franchot Gallo M.D.   On: 04/22/2014 07:20   Dg Chest Port 1 View  04/21/2014   CLINICAL DATA:  Seizure.  Tachycardia.  EXAM: PORTABLE CHEST - 1 VIEW  COMPARISON:  CT and single view of the chest 04/12/2014.  FINDINGS: Bilateral upper lobe airspace disease seen on the comparison studies is markedly improved. The lungs now appear clear. Heart size is normal. No pneumothorax or pleural effusion.  IMPRESSION: No acute finding. Bilateral upper lobe airspace disease has resolved.   Electronically Signed   By: Inge Rise M.D.   On: 04/21/2014 07:40       ASSESSMENT / PLAN:  PULMONARY A: At Risk for Acute  Respiratory Failure - in setting of altered mental status r/t cocaine abuse Aspiration PNA v Cocaine lung (unlikely) ATX P:   RA IS  CARDIOVASCULAR A:  Tachycardia - secondary to cocaine abuse, dehydration  - IMproved ST P:  ICU monitoring risk coc induced ischemia, asa Echo awaited once HR better Reduce fluids Avoid BB with risk unopposed alpha  METABOLIC A: Rhabdomyolysis - severe and Lactic Acidosis due to immobility, possible fall and cocaine and +/- heat. AT risk for possible compartment syndrome of back/ lower extremity P Fluids to 75, further reduction likely   RENAL A:   Hyponatremia  Anticipate AKI - with contrast media + rhabdo  Mild hypophos  P:   Continued fluids but reduce Chem in am   GASTROINTESTINAL / GU  A:   At risk further aspiration Urinary Retention - ? If drug related  P:   diet Foley catheter- dc if able  HEMATOLOGIC A:   dvt prevention P:  DVT Proph: SQ Heparin   INFECTIOUS A:   Leukocytosis  NO evidence for Aspiration P:   Monitor fever curve  ENDOCRINE A:   DM / Hyperglycemia P:   SSI   NEUROLOGIC A:   Hx of seizures Acute lower extremity weakness - normal rectal tone on exam. Due to severe gluteal, pelvic musculature and erector spinae rhabdomyolysis. At risk of compartment syndrome of gluteal musculature Myositis   P:   RASS goal: n/a MRi neg No role steroids May need neuro if not progressing further Active PT OT  To triad, floor  Lavon Paganini. Titus Mould, MD, [place] Pgr: [facility]

## 2014-04-22 NOTE — Progress Notes (Signed)
  Transfer note:  Arrival Method: bed from 26M Mental Orientation: A&O x 4 Telemetry: Placed on telemetry box 1, CCMD notified Skin: intact IV: RAC infusing NS @ 75 and potassium bicarb Pain: 10/10- 12.5 mcg fentanyl given Tubes: foley in place Safety Measures: Educated patient on fall precautions, patient verbalized understanding.  Non-slip socks in place  6700 Orientation: Patient has been oriented to the unit, staff and to the room. Family at bedside

## 2014-04-22 NOTE — Progress Notes (Signed)
Rehab Admissions Coordinator Note:  Patient was screened by Cleatrice Burke for appropriateness for an Inpatient Acute Rehab Consult per PT recommendation. At this time, we are recommending Inpatient Rehab consult when felt appropriate.  Cleatrice Burke 04/22/2014, 1:25 PM  I can be reached at [phone].

## 2014-04-22 NOTE — Progress Notes (Signed)
Pt transferred to [place]. Report called to [place]. All belonging sent with patient. Elink and family notified.

## 2014-04-22 NOTE — Clinical Documentation Improvement (Signed)
Possible Clinical Conditions?  Encephalopathy (describe type if known)                       Anoxic                       Septic                       Alcoholic                        Hepatic                       Hypertensive                       Metabolic                       Toxic Poisoning / Overdose Other Condition Cannot Clinically Determine   Supporting Information: ( As per notes ) "altered mental status r/t cocaine abuse"   Thank You, Alessandra Grout, RN, BSN, CCDS, Clinical Documentation Specialist:  [phone]   [phone]=Cell [place]- Health Information Management

## 2014-04-22 NOTE — Evaluation (Signed)
Physical Therapy Evaluation Patient Details Name: [name] MRN: [id number] DOB: 08-27-1976 Today's Date: 04/22/2014   History of Present Illness  Pt admit after being found down in back yard for unknown time.  Polysubstance abuse with cocaine +.  AMS and rhabdomyolysis.    Clinical Impression  Pt admitted with above. Pt currently with functional limitations due to the deficits listed below (see PT Problem List).  Pt will benefit from skilled PT to increase their independence and safety with mobility to allow discharge to the venue listed below.     Follow Up Recommendations CIR;Supervision/Assistance - 24 hour    Equipment Recommendations  Other (comment) (TBA)    Recommendations for Other Services Rehab consult     Precautions / Restrictions Precautions Precautions: Fall Restrictions Weight Bearing Restrictions: No      Mobility  Bed Mobility Overal bed mobility: Needs Assistance;+2 for physical assistance Bed Mobility: Supine to Sit     Supine to sit: Max assist     General bed mobility comments: Pt having difficulty moving LEs to EOB.  Pt needed assist and at times painful for pt.  Used pad to assist and ease pain.  Pt also needed asssit for elevation of trunk.  Took incr time for pt to get to EOB.    Transfers Overall transfer level: Needs assistance Equipment used: None Transfers: Sit to/from Stand Sit to Stand: Total assist;From elevated surface         General transfer comment: Unable to achieve sit to stand even with assist as pt too weak to clear bottom off bed.  Several attempts but unsuccessful.    Ambulation/Gait                Stairs            Wheelchair Mobility    Modified Rankin (Stroke Patients Only)       Balance Overall balance assessment: Needs assistance;History of Falls Sitting-balance support: Bilateral upper extremity supported;Feet supported Sitting balance-Leahy Scale: Fair Sitting balance - Comments: Could  sit EOB with min guard assist but did not accept challenges to balance.  Sat a total of 18 min at EOB.         Standing balance comment: Unable to achieve upright stance.                               Pertinent Vitals/Pain HR 130-140 bpm with activity,  Generalized pain per pt but not rated    Home Living Family/patient expects to be discharged to:: Private residence Living Arrangements: Alone Available Help at Discharge: Family;Available 24 hours/day (Mom can help per pt) Type of Home: House Home Access: Stairs to enter Entrance Stairs-Rails: None Entrance Stairs-Number of Steps: 4 Home Layout: One level Home Equipment: None      Prior Function Level of Independence: Independent               Hand Dominance        Extremity/Trunk Assessment   Upper Extremity Assessment: Defer to OT evaluation           Lower Extremity Assessment: RLE deficits/detail;LLE deficits/detail RLE Deficits / Details: grossly 2+/5 LLE Deficits / Details: grossly 2+/5     Communication   Communication: No difficulties  Cognition Arousal/Alertness: Lethargic Behavior During Therapy: Anxious Overall Cognitive Status: Impaired/Different from baseline Area of Impairment: Memory;Following commands;Safety/judgement;Awareness;Problem solving       Following Commands: Follows one step commands with  increased time Safety/Judgement: Decreased awareness of safety;Decreased awareness of deficits   Problem Solving: Slow processing;Decreased initiation;Requires verbal cues;Difficulty sequencing      General Comments      Exercises General Exercises - Lower Extremity Ankle Circles/Pumps: AROM;Both;10 reps;Supine Long Arc Quad: AROM;Both;10 reps;Seated      Assessment/Plan    PT Assessment Patient needs continued PT services  PT Diagnosis Generalized weakness;Acute pain   PT Problem List Decreased activity tolerance;Decreased balance;Decreased strength;Decreased  mobility;Decreased knowledge of use of DME;Decreased safety awareness;Decreased knowledge of precautions;Pain  PT Treatment Interventions DME instruction;Gait training;Functional mobility training;Therapeutic activities;Therapeutic exercise;Balance training;Patient/family education   PT Goals (Current goals can be found in the Care Plan section) Acute Rehab PT Goals Patient Stated Goal: to go home PT Goal Formulation: With patient Time For Goal Achievement: 05/06/14 Potential to Achieve Goals: Good    Frequency Min 3X/week   Barriers to discharge        Co-evaluation               End of Session Equipment Utilized During Treatment: Gait belt Activity Tolerance: Patient limited by fatigue;Patient limited by pain Patient left: in bed;with call bell/phone within reach;with bed alarm set;with family/visitor present Nurse Communication: Mobility status;Need for lift equipment (asked nursing to get pt up with lift later today)         Time: [id number] PT Time Calculation (min): 34 min   Charges:   PT Evaluation $Initial PT Evaluation Tier I: 1 Procedure PT Treatments $Therapeutic Exercise: 8-22 mins $Therapeutic Activity: 8-22 mins   PT G Codes:          INGOLD,DAWN 05/17/2014, 11:04 AM [facility] [phone] (pager)

## 2014-04-23 DIAGNOSIS — M609 Myositis, unspecified: Secondary | ICD-10-CM | POA: Diagnosis present

## 2014-04-23 DIAGNOSIS — I517 Cardiomegaly: Secondary | ICD-10-CM

## 2014-04-23 DIAGNOSIS — IMO0001 Reserved for inherently not codable concepts without codable children: Secondary | ICD-10-CM

## 2014-04-23 DIAGNOSIS — F319 Bipolar disorder, unspecified: Secondary | ICD-10-CM | POA: Diagnosis present

## 2014-04-23 DIAGNOSIS — F445 Conversion disorder with seizures or convulsions: Secondary | ICD-10-CM | POA: Diagnosis present

## 2014-04-23 DIAGNOSIS — R569 Unspecified convulsions: Secondary | ICD-10-CM

## 2014-04-23 DIAGNOSIS — M629 Disorder of muscle, unspecified: Secondary | ICD-10-CM

## 2014-04-23 DIAGNOSIS — G909 Disorder of the autonomic nervous system, unspecified: Secondary | ICD-10-CM

## 2014-04-23 DIAGNOSIS — E119 Type 2 diabetes mellitus without complications: Secondary | ICD-10-CM

## 2014-04-23 DIAGNOSIS — E118 Type 2 diabetes mellitus with unspecified complications: Secondary | ICD-10-CM

## 2014-04-23 DIAGNOSIS — G5730 Lesion of lateral popliteal nerve, unspecified lower limb: Secondary | ICD-10-CM | POA: Diagnosis present

## 2014-04-23 DIAGNOSIS — F1410 Cocaine abuse, uncomplicated: Secondary | ICD-10-CM | POA: Diagnosis present

## 2014-04-23 LAB — GLUCOSE, CAPILLARY
Glucose-Capillary: 153 mg/dL — ABNORMAL HIGH (ref 70–99)
Glucose-Capillary: 177 mg/dL — ABNORMAL HIGH (ref 70–99)
Glucose-Capillary: 210 mg/dL — ABNORMAL HIGH (ref 70–99)
Glucose-Capillary: 206 mg/dL — ABNORMAL HIGH (ref 70–99)

## 2014-04-23 LAB — BASIC METABOLIC PANEL
BUN: 4 mg/dL — ABNORMAL LOW (ref 6–23)
CO2: 22 mEq/L (ref 19–32)
Calcium: 8.1 mg/dL — ABNORMAL LOW (ref 8.4–10.5)
Chloride: 100 mEq/L (ref 96–112)
Creatinine, Ser: 0.44 mg/dL — ABNORMAL LOW (ref 0.50–1.10)
GFR calc Af Amer: >90 mL/min (ref >90)
GFR calc non Af Amer: >90 mL/min (ref >90)
Glucose, Bld: 167 mg/dL — ABNORMAL HIGH (ref 70–99)
Potassium: 3.8 mEq/L (ref 3.7–5.3)
Sodium: 136 mEq/L — ABNORMAL LOW (ref 137–147)

## 2014-04-23 LAB — CK: Total CK: 6057 U/L — ABNORMAL HIGH (ref 7–177)

## 2014-04-23 LAB — PHOSPHORUS: Phosphorus: 3.3 mg/dL (ref 2.3–4.6)

## 2014-04-23 LAB — MAGNESIUM: Magnesium: 1.6 mg/dL (ref 1.5–2.5)

## 2014-04-23 MED ORDER — QUETIAPINE FUMARATE 100 MG PO TABS
100.0000 mg | ORAL_TABLET | Freq: Every day | ORAL | Status: DC
  Administered 2014-04-23 – 2014-04-27 (×5): 100 mg via ORAL
  Filled 2014-04-23 (×6): qty 1

## 2014-04-23 MED ORDER — MUPIROCIN 2 % EX OINT
1.0000 "application " | TOPICAL_OINTMENT | Freq: Two times a day (BID) | CUTANEOUS | Status: DC
  Administered 2014-04-23 – 2014-04-27 (×9): 1 via NASAL
  Filled 2014-04-23: qty 22

## 2014-04-23 MED ORDER — CLONAZEPAM 1 MG PO TABS
1.0000 mg | ORAL_TABLET | Freq: Every day | ORAL | Status: DC
  Administered 2014-04-23 – 2014-04-27 (×5): 1 mg via ORAL
  Filled 2014-04-23 (×5): qty 1

## 2014-04-23 MED ORDER — GLUCERNA SHAKE PO LIQD
237.0000 mL | Freq: Three times a day (TID) | ORAL | Status: DC
  Administered 2014-04-23: 237 mL via ORAL

## 2014-04-23 MED ORDER — CHLORHEXIDINE GLUCONATE CLOTH 2 % EX PADS
6.0000 | MEDICATED_PAD | Freq: Every day | CUTANEOUS | Status: AC
  Administered 2014-04-24 – 2014-04-27 (×4): 6 via TOPICAL

## 2014-04-23 MED ORDER — TRAMADOL HCL 50 MG PO TABS
100.0000 mg | ORAL_TABLET | Freq: Four times a day (QID) | ORAL | Status: DC
  Administered 2014-04-23 – 2014-04-28 (×20): 100 mg via ORAL
  Filled 2014-04-23 (×20): qty 2

## 2014-04-23 MED ORDER — HALOPERIDOL 5 MG PO TABS
5.0000 mg | ORAL_TABLET | Freq: Three times a day (TID) | ORAL | Status: DC
  Administered 2014-04-23 – 2014-04-28 (×17): 5 mg via ORAL
  Filled 2014-04-23 (×18): qty 1

## 2014-04-23 MED ORDER — KETOROLAC TROMETHAMINE 30 MG/ML IJ SOLN
30.0000 mg | Freq: Four times a day (QID) | INTRAMUSCULAR | Status: DC
  Administered 2014-04-23 – 2014-04-27 (×16): 30 mg via INTRAVENOUS
  Filled 2014-04-23 (×23): qty 1

## 2014-04-23 MED ORDER — INSULIN ASPART 100 UNIT/ML ~~LOC~~ SOLN
0.0000 [IU] | Freq: Three times a day (TID) | SUBCUTANEOUS | Status: DC
Start: 2014-04-23 — End: 2014-04-28
  Administered 2014-04-23: 7 [IU] via SUBCUTANEOUS
  Administered 2014-04-23: 4 [IU] via SUBCUTANEOUS
  Administered 2014-04-23 – 2014-04-24 (×4): 7 [IU] via SUBCUTANEOUS
  Administered 2014-04-25: 11 [IU] via SUBCUTANEOUS
  Administered 2014-04-25: 7 [IU] via SUBCUTANEOUS
  Administered 2014-04-26: 11 [IU] via SUBCUTANEOUS
  Administered 2014-04-26: 3 [IU] via SUBCUTANEOUS
  Administered 2014-04-27: 7 [IU] via SUBCUTANEOUS
  Administered 2014-04-27: 4 [IU] via SUBCUTANEOUS
  Administered 2014-04-27: 3 [IU] via SUBCUTANEOUS
  Administered 2014-04-28: 4 [IU] via SUBCUTANEOUS
  Administered 2014-04-28: 13:00:00 via SUBCUTANEOUS

## 2014-04-23 MED ORDER — AMITRIPTYLINE HCL 100 MG PO TABS
100.0000 mg | ORAL_TABLET | Freq: Every day | ORAL | Status: DC
  Administered 2014-04-23 – 2014-04-27 (×5): 100 mg via ORAL
  Filled 2014-04-23 (×6): qty 1

## 2014-04-23 NOTE — Progress Notes (Addendum)
[facility] Progress Note   [name] [id number] DOB: Aug 06, 1976 DOA: 04/20/2014 PCP: Suzan Garibaldi, FNP  Brief narrative: [name] is a 38 y.o. female  smoker, with PMH of DM, HTN, pseudoseizures, cocaine and alcohol abuse, multiple admissions for substance abuse and involuntary commitment admitted on 6/28 after being found down in yard. Found to have elevated CK, glucose, lactic acid and CXR concerning for aspiration. UDS positive for cocaine. Patient unable to move lower extremities.   SIGNIFICANT EVENTS and STUDIES:  6/28 - Admit after being found down - elevated CK, glucose, & lactic acid. CXR concerning for aspiration. UDS positive for cocaine, pt with no memory of events.  6/28 CT Head>>>neg for ICH but has ethmoid and maxillary sinusitis  6/28 CT C-Spine>>>no cervical fx  6/26 CT Chest >>>bilateral upper GGO  6/28 CT ABD/Pelvis>>>no acute abnormality, small bilateral renal lesions (? Cysts)  6/28 - MRI T spine: normal  6/28 - MRI L spine- no stenosis, severe gluteal, pelvic and erector spinae mysositis (pulses in lowers are dopplered +)    Subjective: Mother at bedside tell me the patient was evaluated at [facility] for 3 days and was told that she had pseudoseizures and not actual seizures. She continues to have these pseudoseizures at home.  On the day she was admitted, she admits to smoking cocaine and drinking gin but cannot recall how much. She was found face down in her yard and does not recall how she got there. She was taking all of her pscy meds appropriately at home for the past 2 wks but was taking her Levemir irregularly.   Has pain in left groin. Still unable to lift legs off of the bed.   Assessment/Plan: Principal Problem:   Rhabdomyolysis - cont to hydrate - CK has come down significantly from 44,127 on admission  Active Problems: Acute toxic encephalopathy - pt was not sure of how she got out to the yard -CT head negative - suspect  cocaine and alcohol intoxication  - resolved  Elevated LFTs - improving- likely related to rhabdo- no fatty liver on CT    Paraparesis of both lower limbs/ Myositis - due to being on ground??- she was found face down but her myositis is in gluteal area, erector spinae (left greater than right) and adductor muscles (right more than left) - d/c narcotics - start Toradol and Tramadol for pain  Left leg numbness-  - have asked for a neuro consult to evaluate- suspect nerve compression in related to above myositis-     HCAP (healthcare-associated pneumonia) and b/l Maxillary sinusiits ( on CT) - b/l upper lobe on CT and CXR - Zosyn started on 6/28 but stopped the following day by PCCM - monitor for symptoms  Renal cysts?'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  Cocaine and alcohol abuse - states she will quit - will have case management assist with giving info on local services  DM - non- compliance with taking Levemir regularly (has no insurance but gets samples from PCP) - not eating much yet- keep on current sliding scale and start low dose Levemir  Bipolar - states she sleep a lot during the day but she takes her psych meds as ordered - resume home meds- Clonazepam only QHS rather than TID, Haldol TID, Seroquel at bedtime- hold Depakote TID for now (note no Depakote level obtained on admission) - monitor for oversedation during the day  Tachycardia - sinus tach - check thyroid function - may be due to pain - ECHO ordered by ICU  Anorexia - not eating at all - start Glucerna- advised to start eating meals     Code Status: full code Family Communication: with mother Disposition Plan: possibly CIR  Consultants: Neuro PCCM  Procedures: none  Antibiotics: Anti-infectives   Start     Dose/Rate Route Frequency Ordered Stop   04/20/14 2200  piperacillin-tazobactam (ZOSYN) IVPB 3.375 g  Status:  Discontinued      3.375 g 12.5 mL/hr over 240 Minutes Intravenous 3 times per day 04/20/14 1813 04/21/14 1430   04/20/14 1100  vancomycin (VANCOCIN) IVPB 1000 mg/200 mL premix     1,000 mg 200 mL/hr over 60 Minutes Intravenous  Once 04/20/14 1053 04/20/14 1407   04/20/14 1100  piperacillin-tazobactam (ZOSYN) IVPB 3.375 g     3.375 g 12.5 mL/hr over 240 Minutes Intravenous  Once 04/20/14 1053 04/20/14 1353       DVT prophylaxis: Heparin  Objective: Filed Weights   04/21/14 0400 04/22/14 0500 04/22/14 2057  Weight: 103 kg (227 lb 1.2 oz) 105.7 kg (233 lb 0.4 oz) 108.092 kg (238 lb 4.8 oz)    Vitals Filed Vitals:   04/22/14 1838 04/22/14 2057 04/23/14 0444 04/23/14 1000  BP: 124/93 118/81 124/78 120/83  Pulse: 117 122 121 122  Temp: 98.2 F (36.8 C) 98.5 F (36.9 C) 98.8 F (37.1 C) 98.4 F (36.9 C)  TempSrc: Oral Oral Oral Oral  Resp: 19 18 17 18   Height:      Weight:  108.092 kg (238 lb 4.8 oz)    SpO2: 100% 100% 97% 98%      Intake/Output Summary (Last 24 hours) at 04/23/14 1555 Last data filed at 04/23/14 0700  Gross per 24 hour  Intake 1554.17 ml  Output   1650 ml  Net -95.83 ml     Exam: General: No acute respiratory distress- flat affect, tearful when her mother talks about her drug and ETOH abuse and not taking care of herself Lungs: Clear to auscultation bilaterally without wheezes or crackles Cardiovascular: Regular rate and rhythm without murmur gallop or rub normal S1 and S2- tachycardic Abdomen: Nontender, nondistended, soft, bowel sounds positive- morbidly obese Extremities: No significant cyanosis, clubbing, or edema bilateral lower extremities-  MSK: unable to lift legs off bed or bend at the knee- can flex and extend feet- tender in lower back and buttock area Neuro: no sensation to touch below the knee on right leg- mimima leg movt as mentioned above, arm strength intact, CN 2-12 intact  Data Reviewed: Basic Metabolic Panel:  Recent Labs Lab 04/20/14 1020  04/20/14 1034 04/20/14 1850 04/21/14 0641 04/22/14 0555 04/23/14 0550  NA 134* 135* 140  139 137 140 136*  K 5.0 4.8 4.6  4.3 3.5* 3.9 3.8  CL 92* 98 101  99 101 106 100  CO2 19  --  21  21 22 19 22   GLUCOSE 375* 389* 260*  259* 135* 125* 167*  BUN 10 9 8  8 7  4* 4*  CREATININE 0.53 0.70 0.60  0.55 0.51 0.43* 0.44*  CALCIUM 8.5  --  7.4*  7.3* 7.5* 7.7* 8.1*  MG  --   --   --  1.9 1.8 1.6  PHOS  --   --   --  2.5 2.2* 3.3   Liver Function Tests:  Recent Labs Lab 04/20/14 1020 04/20/14 1850 04/21/14 0641 04/22/14 0555  AST 223*  299* 257* 160*  ALT 75* 107* 107* 86*  ALKPHOS 79 68 66 84  BILITOT 0.3 0.2* 0.2* 0.4  PROT 7.3 6.4 5.5* 5.3*  ALBUMIN 3.4* 2.7* 2.1* 1.7*   No results found for this basename: LIPASE, AMYLASE,  in the last 168 hours No results found for this basename: AMMONIA,  in the last 168 hours CBC:  Recent Labs Lab 04/20/14 1020 04/20/14 1034 04/20/14 1850 04/21/14 0641 04/22/14 0555  WBC 16.8*  --  10.5 11.1* 9.4  NEUTROABS 14.0*  --  8.4*  --  6.6  HGB 16.9* 18.0* 14.4 14.7 13.6  HCT 47.9* 53.0* 41.0 43.5 40.9  MCV 92.6  --  92.1 94.6 95.6  PLT 386  --  331 278 228   Cardiac Enzymes:  Recent Labs Lab 04/20/14 1020 04/20/14 1850 04/21/14 0031 04/21/14 0641 04/21/14 0913 04/23/14 0550  CKTOTAL 91478* 44127*  --  32031*  --  6057*  TROPONINI  --  0.85* 0.33* 0.59* 0.60*  --    BNP (last 3 results)  Recent Labs  04/20/14 1850 04/21/14 0641  PROBNP 4466.0* 4365.0*   CBG:  Recent Labs Lab 04/22/14 1543 04/22/14 2056 04/23/14 0149 04/23/14 0734 04/23/14 1132  GLUCAP 197* 240* 153* 177* 210*    Recent Results (from the past 240 hour(s))  CULTURE, BLOOD (ROUTINE X 2)     Status: None   Collection Time    04/20/14 10:54 AM      Result Value Ref Range Status   Specimen Description BLOOD LEFT WRIST   Final   Special Requests BOTTLES DRAWN AEROBIC AND ANAEROBIC 5CC   Final   Culture  Setup Time     Final   Value:  04/20/2014 22:49     Performed at [facility]   Culture     Final   Value:        BLOOD CULTURE RECEIVED NO GROWTH TO DATE CULTURE WILL BE HELD FOR 5 DAYS BEFORE ISSUING A FINAL NEGATIVE REPORT     Performed at [facility]   Report Status PENDING   Incomplete  CULTURE, BLOOD (ROUTINE X 2)     Status: None   Collection Time    04/20/14 10:59 AM      Result Value Ref Range Status   Specimen Description BLOOD RIGHT HAND   Final   Special Requests BOTTLES DRAWN AEROBIC ONLY 10CC   Final   Culture  Setup Time     Final   Value: 04/20/2014 22:49     Performed at [facility]   Culture     Final   Value:        BLOOD CULTURE RECEIVED NO GROWTH TO DATE CULTURE WILL BE HELD FOR 5 DAYS BEFORE ISSUING A FINAL NEGATIVE REPORT     Performed at [facility]   Report Status PENDING   Incomplete  MRSA PCR SCREENING     Status: Abnormal   Collection Time    04/20/14  6:01 PM      Result Value Ref Range Status   MRSA by PCR POSITIVE (*) NEGATIVE Final   Comment:            The GeneXpert MRSA Assay (FDA     approved for NASAL specimens     only), is one component of a     comprehensive MRSA colonization     surveillance program. It is not     intended to diagnose MRSA  infection nor to guide or     monitor treatment for     MRSA infections.     RESULT CALLED TO, READ BACK BY AND VERIFIED WITH:     ROYAL,K RN 2010 04/20/14 MITCHELL,L     Studies:  Recent x-ray studies have been reviewed in detail by the Attending Physician  Scheduled Meds:  Scheduled Meds: . amitriptyline  100 mg Oral QHS  . aspirin EC  81 mg Oral Daily  . Chlorhexidine Gluconate Cloth  6 each Topical Q0600  . clonazePAM  1 mg Oral QHS  . famotidine  20 mg Oral BID  . feeding supplement (GLUCERNA SHAKE)  237 mL Oral TID BM  . haloperidol  5 mg Oral TID  . heparin  5,000 Units Subcutaneous 3 times per day  . insulin aspart  0-20 Units Subcutaneous TID WC  . ketorolac  30 mg  Intravenous 4 times per day  . mupirocin ointment  1 application Nasal BID  . QUEtiapine  100 mg Oral QHS  . traMADol  100 mg Oral QID   Continuous Infusions: . sodium chloride 75 mL/hr at 04/23/14 0537    Time spent on care of this patient: >45 min   [place], MD 04/23/2014, 3:55 PM  LOS: 3 days   [facility] Office  [phone] Pager - Text Page per Shea Evans   If 7PM-7AM, please contact night-coverage [email or web address]

## 2014-04-23 NOTE — Evaluation (Signed)
Occupational Therapy Evaluation Patient Details Name: [name] MRN: [id number] DOB: 06-29-1976 Today's Date: 04/23/2014    History of Present Illness Pt admit after being found down in back yard for unknown time.  Polysubstance abuse with cocaine +.  AMS and rhabdomyolysis.     Clinical Impression   Pt demonstrates decline in function and safety with ADLs and ADL mobility with decreased strength, balance and endurance. Pain limiting pt's function with ADLs and ADL mobility. Pt would benefit from acute OT services to address impairments to increase level of function and safety    Follow Up Recommendations  CIR    Equipment Recommendations  None recommended by OT;Other (comment) (TBD at next venue of care)    Recommendations for Other Services       Precautions / Restrictions Precautions Precautions: Fall Restrictions Weight Bearing Restrictions: No      Mobility Bed Mobility Overal bed mobility: Needs Assistance Bed Mobility: Rolling;Sidelying to Sit;Sit to Supine;Supine to Sit Rolling: Max assist Sidelying to sit: Max assist Supine to sit: Max assist;HOB elevated Sit to supine: Max assist   General bed mobility comments: required assist with LEs to EOB and for trunk elevation with HOB raised. Used pad to assist wiht scoot to EOB; painful with rolling and scooting  Transfers Overall transfer level: Needs assistance Equipment used:  (Gait belt/knees blocked) Transfers: Sit to/from [facility] Sit to Stand: Total assist;From elevated surface;+2 physical assistance Stand pivot transfers: +2 physical assistance;Total assist       General transfer comment: unable to transfer without +2 physical assist. Per PT note, pt is total A +2 for transfers    Balance   Sitting-balance support: Single extremity supported;Feet supported Sitting balance-Leahy Scale: Fair Sitting balance - Comments: Could sit EOB with min guard assist but did not accept  challenges to balance.      Standing balance-Leahy Scale: Zero Standing balance comment: unable to transfer without +2 physical assist. Per PT note, pt is total A +2 for sit - stand                            ADL Overall ADL's : Needs assistance/impaired     Grooming: Wash/dry hands;Wash/dry face;Sitting;Minimal assistance   Upper Body Bathing: Minimal assitance;Sitting   Lower Body Bathing: Total assistance   Upper Body Dressing : Minimal assistance;Sitting   Lower Body Dressing: Total assistance     Toilet Transfer Details (indicate cue type and reason): unable to transfer without +2 physical assist. Per PT note, pt is total A +2 for transfers. Has Foley Toileting- Water quality scientist and Hygiene: Total assistance         General ADL Comments: pain limiting function for ADLs. Pt requires increased time, slow pace     Vision  wear glasses                   Perception Perception Perception Tested?: No   Praxis Praxis Praxis tested?: Not tested    Pertinent Vitals/Pain 8/10 back and LE pain reported, VSS     Hand Dominance Right   Extremity/Trunk Assessment Upper Extremity Assessment Upper Extremity Assessment: Generalized weakness   Lower Extremity Assessment Lower Extremity Assessment: Defer to PT evaluation       Communication Communication Communication: No difficulties   Cognition Arousal/Alertness: Awake/alert Behavior During Therapy: Anxious Overall Cognitive Status: Impaired/Different from baseline Area of Impairment: Memory;Following commands;Safety/judgement;Awareness;Problem solving       Following Commands: Follows  one step commands with increased time Safety/Judgement: Decreased awareness of safety;Decreased awareness of deficits   Problem Solving: Slow processing;Decreased initiation;Requires verbal cues;Difficulty sequencing     General Comments                   Home Living Family/patient expects to be  discharged to:: Private residence Living Arrangements: Alone Available Help at Discharge: Family;Available 24 hours/day Type of Home: House Home Access: Stairs to enter     Home Layout: One level     Bathroom Shower/Tub: Teacher, early years/pre: Standard     Home Equipment: Shower seat          Prior Functioning/Environment Level of Independence: Independent             OT Diagnosis: Generalized weakness;Acute pain   OT Problem List: Decreased strength;Decreased knowledge of use of DME or AE;Decreased activity tolerance;Pain;Impaired balance (sitting and/or standing)   OT Treatment/Interventions: Self-care/ADL training;Therapeutic exercise;Patient/family education;Neuromuscular education;Balance training;Therapeutic activities;DME and/or AE instruction    OT Goals(Current goals can be found in the care plan section) Acute Rehab OT Goals Patient Stated Goal: to go home ADL Goals Pt Will Perform Grooming: with min guard assist;with supervision;with set-up;sitting Pt Will Perform Upper Body Bathing: with min guard assist;with supervision;with set-up;sitting Pt Will Perform Lower Body Bathing: with max assist;with mod assist;sitting/lateral leans Pt Will Perform Upper Body Dressing: with min guard assist;with supervision;with set-up;sitting Pt Will Transfer to Toilet: with max assist;bedside commode Pt Will Perform Toileting - Clothing Manipulation and hygiene: with max assist;with mod assist;sitting/lateral leans;sit to/from stand  OT Frequency: Min 2X/week   Barriers to D/C: Decreased caregiver support                        End of Session    Activity Tolerance: Patient limited by pain Patient left: in bed;with call bell/phone within reach   Time: [id number] OT Time Calculation (min): 21 min Charges:  OT General Charges $OT Visit: 1 Procedure OT Evaluation $Initial OT Evaluation Tier I: 1 Procedure OT Treatments $Therapeutic Activity: 8-22  mins G-Codes:    Britt Bottom 04/23/2014, 1:15 PM

## 2014-04-23 NOTE — Progress Notes (Signed)
I await further progress with therapy to assist in determining most appropriate rehab venue options. [id number]

## 2014-04-23 NOTE — Progress Notes (Signed)
Physical Therapy Treatment Patient Details Name: [name] MRN: [id number] DOB: 10-02-1976 Today's Date: 04/23/2014    History of Present Illness Pt admit after being found down in back yard for unknown time.  Polysubstance abuse with cocaine +.  AMS and rhabdomyolysis.      PT Comments    Making small improvements in mobility today; able to perform 3 trials of standing with heavy knee blocking bilaterally; consider trying sliding board next session  Follow Up Recommendations  CIR;Supervision/Assistance - 24 hour     Equipment Recommendations  Other (comment) (TBA)    Recommendations for Other Services Rehab consult     Precautions / Restrictions Precautions Precautions: Fall Restrictions Weight Bearing Restrictions: No    Mobility  Bed Mobility Overal bed mobility: Needs Assistance;+2 for physical assistance Bed Mobility: Rolling;Sidelying to Sit;Sit to Supine Rolling: Max assist Sidelying to sit: +2 for physical assistance;Max assist   Sit to supine: +2 for physical assistance;Total assist   General bed mobility comments: Rolled R and L with max assist to place pad at pelvis to help with getting up ; Required total assist to flex bil hips and knees to acheive hooklying position; Painful with rolling both right and left, cues to reach for rails to aide in rolling, needed phsyical assist to reach rails R and L; max assist/support given at pelvis and shoulders as a force couple to acheive sitting  Transfers Overall transfer level: Needs assistance Equipment used:  (Gait belt/knees blocked) Transfers: Sit to/from [facility] Sit to Stand: Total assist;From elevated surface;+2 physical assistance Stand pivot transfers: +2 physical assistance;Total assist       General transfer comment: Requried bil heavy knees blocking and tight hod on gait belt with +2 assist to acheive standing; Very limited by weakness; Ultimately decided not to get to [facility] (pt  reproteing needign to have a BM) for safety reasons; perofrmed semi-stand pivot transfer to move up in the bed for better positioning  Ambulation/Gait                 Stairs            Wheelchair Mobility    Modified Rankin (Stroke Patients Only)       Balance     Sitting balance-Leahy Scale: Fair Sitting balance - Comments: Could sit EOB with min guard assist but did not accept challenges to balance.  Sat a total of 18 min at EOB.       Standing balance-Leahy Scale: Zero Standing balance comment: Required heavy bilateral assis tand bil knees blocked to acheive standing (for less than 30 seconds                    Cognition Arousal/Alertness: Awake/alert Behavior During Therapy: Anxious Overall Cognitive Status: Impaired/Different from baseline Area of Impairment: Memory;Following commands;Safety/judgement;Awareness;Problem solving       Following Commands: Follows one step commands with increased time Safety/Judgement: Decreased awareness of safety;Decreased awareness of deficits   Problem Solving: Slow processing;Decreased initiation;Requires verbal cues;Difficulty sequencing      Exercises General Exercises - Lower Extremity Ankle Circles/Pumps: Both;Supine;AAROM;PROM;5 reps Long Arc Quad: AROM;Both;Seated;5 reps    General Comments        Pertinent Vitals/Pain Generalized pain with mobility; Especially with rolling; Pt did not rate patient repositioned for comfort     Home Living Family/patient expects to be discharged to:: Private residence Living Arrangements: Alone Available Help at Discharge: Family;Available 24 hours/day Type of Home: House Home Access: Stairs to  enter   Home Layout: One level Home Equipment: Shower seat      Prior Function Level of Independence: Independent          PT Goals (current goals can now be found in the care plan section) Acute Rehab PT Goals Patient Stated Goal: to go home PT Goal  Formulation: With patient Time For Goal Achievement: 05/06/14 Potential to Achieve Goals: Good Progress towards PT goals: Progressing toward goals (very slowly)    Frequency  Min 3X/week    PT Plan Current plan remains appropriate    Co-evaluation             End of Session Equipment Utilized During Treatment: Gait belt Activity Tolerance: Patient limited by fatigue;Patient limited by pain Patient left: in bed;with call bell/phone within reach;with bed alarm set;with family/visitor present (bed in semi-chair position)     Time: [id number] PT Time Calculation (min): 28 min  Charges:  $Therapeutic Activity: 23-37 mins                    G Codes:      Roney Marion Hamff 04/23/2014, 11:11 AM  Roney Marion, [place] Pager [phone] Office [phone]

## 2014-04-23 NOTE — Progress Notes (Signed)
  Echocardiogram 2D Echocardiogram has been performed.  [name] 04/23/2014, 2:32 PM

## 2014-04-23 NOTE — Plan of Care (Signed)
Problem: Phase I Progression Outcomes Goal: OOB as tolerated unless otherwise ordered Outcome: Progressing PT/OT consulted.

## 2014-04-23 NOTE — Progress Notes (Signed)
Orthopedic Tech Progress Note Patient Details:  [name] Aug 10, 1976 [id number]  Ortho Devices Type of Ortho Device: Postop shoe/boot Ortho Device/Splint Location: (B) prafo boots Ortho Device/Splint Interventions: Ordered;Application   Braulio Bosch 04/23/2014, 7:02 PM

## 2014-04-23 NOTE — Consult Note (Signed)
Physical Medicine and Rehabilitation Consult  Reason for Consult: Rhabdomyolysis with myositis lumbar and pelvic musculature.  Referring Physician:  Dr. Wynelle Cleveland.    HPI: [name] is a 38 y.o. female smoker, with h/o DM with peripheral neuropathy, HTN, Seizures, polysubstance abuse, multiple admissions for substance abuse as well as involuntary commitment; who was admitted on 6/28 after being found down in yard with decreased MS and inability to move legs.  She was found to have elevated CK, elevated glucose, lactic acid, severely distended bladder as well as CXR concerning for aspiration. UDS positive for cocaine and patient without memory of events. Foley placed with 2.5 L from bladder and she was treated with IVF for severe rhabdomyolysis as well as IV antibiotics of probable sepsis form aspiration PNA.  CT Head/Cervical spine/and abdomen without acute abnormality. CT chest with bilateral upper lobe ground glass opacities. Dr. Arnoldo Morale recommended MRI thoraco-lumbar spine to rule out SCI.  MRI toracic spine negative and lumbar spine films revealed confluent severe muscle edema in lower pelvis affecting gluteal muscle, adductors and lower lumbar erector spinae muscles--likely due to severe myositis in setting of Rhabdo.  Physical therapy initiated yesterday and patient with BLE weakness and pain affecting mobility. CIR recommended by rehab team and MD.   Pt c/o R>L foot numbness Weakness in bilateral hips and legs BMET    Component Value Date/Time   NA 136* 04/23/2014 0550   K 3.8 04/23/2014 0550   CL 100 04/23/2014 0550   CO2 22 04/23/2014 0550   GLUCOSE 167* 04/23/2014 0550   BUN 4* 04/23/2014 0550   CREATININE 0.44* 04/23/2014 0550   CALCIUM 8.1* 04/23/2014 0550   GFRNONAA >90 04/23/2014 0550   GFRAA >90 04/23/2014 0550    Review of Systems  HENT: Negative for hearing loss.   Eyes: Negative for blurred vision and double vision.  Respiratory: Negative for cough, sputum production, shortness  of breath and wheezing.   Cardiovascular: Negative for chest pain and palpitations.  Musculoskeletal: Positive for back pain, joint pain (bilateral hips) and myalgias.  Neurological: Positive for sensory change (RLE numbness and decreased sensation LLE) and weakness. Negative for dizziness, tingling and headaches.    Past Medical History  Diagnosis Date  . Diabetes mellitus without complication   . Hypertension   . Cocaine abuse   . Seizures    Past Surgical History  Procedure Laterality Date  . Cholecystectomy  2005   Family History  Problem Relation Age of Onset  . Hypertension Other    Social History:  Lives alone. Grandparents live next door and support her. Mother can stay with her past discharge. She reports that she has been smoking--1PPD  She does not have any smokeless tobacco history on file. She reports that she uses illicit drugs (Cocaine) occasionally. She drinks alcohol when using "doping".     Allergies  Allergen Reactions  . Erythromycin Rash    Medications Prior to Admission  Medication Sig Dispense Refill  . amitriptyline (ELAVIL) 50 MG tablet Take 100 mg by mouth at bedtime.      . clonazePAM (KLONOPIN) 1 MG tablet Take 1 mg by mouth 3 (three) times daily.      . divalproex (DEPAKOTE ER) 500 MG 24 hr tablet Take 500 mg by mouth 3 (three) times daily.      . haloperidol (HALDOL) 5 MG tablet Take 5 mg by mouth 3 (three) times daily.      . insulin detemir (LEVEMIR) 100 UNIT/ML  injection Inject 60 Units into the skin daily.      . insulin lispro (HUMALOG) 100 UNIT/ML injection Inject 2-12 Units into the skin 3 (three) times daily with meals.      Marland Kitchen QUEtiapine (SEROQUEL) 50 MG tablet Take 100 mg by mouth at bedtime.        Home: Home Living Family/patient expects to be discharged to:: Private residence Living Arrangements: Alone Available Help at Discharge: Family;Available 24 hours/day Type of Home: House Home Access: Stairs to enter [employer] of Steps: 4 Entrance Stairs-Rails: None Home Layout: One level Home Equipment: Careers adviser History: Prior Function Level of Independence: Independent Functional Status:  Mobility: Bed Mobility Overal bed mobility: Needs Assistance;+2 for physical assistance Bed Mobility: Rolling;Sidelying to Sit;Sit to Supine Rolling: Max assist Sidelying to sit: +2 for physical assistance;Max assist Supine to sit: Max assist Sit to supine: +2 for physical assistance;Total assist General bed mobility comments: Rolled R and L with max assist to place pad at pelvis to help with getting up ; Required total assist to flex bil hips and knees to acheive hooklying position; Painful with rolling both right and left, cues to reach for rails to aide in rolling, needed phsyical assist to reach rails R and L; max assist/support given at pelvis and shoulders as a force couple to acheive sitting Transfers Overall transfer level: Needs assistance Equipment used:  (Gait belt/knees blocked) Transfers: Sit to/from [facility] Sit to Stand: Total assist;From elevated surface;+2 physical assistance Stand pivot transfers: +2 physical assistance;Total assist General transfer comment: Requried bil heavy knees blocking and tight hod on gait belt with +2 assist to acheive standing; Very limited by weakness; Ultimately decided not to get to [facility] (pt reproteing needign to have a BM) for safety reasons; perofrmed semi-stand pivot transfer to move up in the bed for better positioning      ADL:    Cognition: Cognition Overall Cognitive Status: Impaired/Different from baseline Orientation Level: Oriented X4 Cognition Arousal/Alertness: Awake/alert Behavior During Therapy: Anxious Overall Cognitive Status: Impaired/Different from baseline Area of Impairment: Memory;Following commands;Safety/judgement;Awareness;Problem solving Following Commands: Follows one step commands with increased  time Safety/Judgement: Decreased awareness of safety;Decreased awareness of deficits Problem Solving: Slow processing;Decreased initiation;Requires verbal cues;Difficulty sequencing  Blood pressure 120/83, pulse 122, temperature 98.4 F (36.9 C), temperature source Oral, resp. rate 18, height 5\' 5"  (1.651 m), weight 108.092 kg (238 lb 4.8 oz), SpO2 98.00%. Physical Exam  Nursing note and vitals reviewed. Constitutional: She is oriented to person, place, and time. She appears well-developed and well-nourished.  Obese female. Flat affect  HENT:  Head: Normocephalic and atraumatic.  Eyes: Conjunctivae are normal. Pupils are equal, round, and reactive to light.  Neck: Normal range of motion. Neck supple.  Cardiovascular: Normal rate and regular rhythm.   No murmur heard. Respiratory: Effort normal and breath sounds normal. No respiratory distress. She has no wheezes.  GI: Soft. Bowel sounds are normal.  Musculoskeletal:  Bilateral foot drop. Pain with attempts at ROM bilateral hips, knees and feet.   Neurological: She is alert and oriented to person, place, and time.  Follows commands without difficulty. Paraparesis BLE  Skin: Skin is warm and dry.  decreased sensation both feet at 1st web space, 2- HF, KE, 1/5 R ankle PF, 0/5 R ankle DF, 2-/5 Left toe flexor  Results for orders placed during the hospital encounter of 04/20/14 (from the past 24 hour(s))  GLUCOSE, CAPILLARY     Status: Abnormal   Collection  Time    04/22/14  1:12 PM      Result Value Ref Range   Glucose-Capillary 172 (*) 70 - 99 mg/dL  GLUCOSE, CAPILLARY     Status: Abnormal   Collection Time    04/22/14  3:43 PM      Result Value Ref Range   Glucose-Capillary 197 (*) 70 - 99 mg/dL   Comment 1 Notify RN    GLUCOSE, CAPILLARY     Status: Abnormal   Collection Time    04/22/14  8:56 PM      Result Value Ref Range   Glucose-Capillary 240 (*) 70 - 99 mg/dL  GLUCOSE, CAPILLARY     Status: Abnormal   Collection Time      04/23/14  1:49 AM      Result Value Ref Range   Glucose-Capillary 153 (*) 70 - 99 mg/dL  MAGNESIUM     Status: None   Collection Time    04/23/14  5:50 AM      Result Value Ref Range   Magnesium 1.6  1.5 - 2.5 mg/dL  PHOSPHORUS     Status: None   Collection Time    04/23/14  5:50 AM      Result Value Ref Range   Phosphorus 3.3  2.3 - 4.6 mg/dL  BASIC METABOLIC PANEL     Status: Abnormal   Collection Time    04/23/14  5:50 AM      Result Value Ref Range   Sodium 136 (*) 137 - 147 mEq/L   Potassium 3.8  3.7 - 5.3 mEq/L   Chloride 100  96 - 112 mEq/L   CO2 22  19 - 32 mEq/L   Glucose, Bld 167 (*) 70 - 99 mg/dL   BUN 4 (*) 6 - 23 mg/dL   Creatinine, Ser 0.44 (*) 0.50 - 1.10 mg/dL   Calcium 8.1 (*) 8.4 - 10.5 mg/dL   GFR calc non Af Amer >90  >90 mL/min   GFR calc Af Amer >90  >90 mL/min  CK     Status: Abnormal   Collection Time    04/23/14  5:50 AM      Result Value Ref Range   Total CK 6057 (*) 7 - 177 U/L  GLUCOSE, CAPILLARY     Status: Abnormal   Collection Time    04/23/14  7:34 AM      Result Value Ref Range   Glucose-Capillary 177 (*) 70 - 99 mg/dL   Dg Chest Port 1 View  04/22/2014   CLINICAL DATA:  Assess edema  EXAM: PORTABLE CHEST - 1 VIEW  COMPARISON:  04/21/2014  FINDINGS: Heart size and pulmonary vascularity are within normal limits.  The right lung remains clear. Question mild left lower lobe atelectasis/infiltrate and small left effusion. Slight increase in left lower lobe opacity compared with yesterday.  IMPRESSION: Question early infiltrate or atelectasis in the left lower lobe.   Electronically Signed   By: Franchot Gallo M.D.   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 Does the need for close, 24 hr/day medical supervision in concert with the patient's rehab needs make it unreasonable for this patient to be served in a less intensive setting?  Potentially 2. Co-Morbidities requiring supervision/potential complications: AMS, lactic acidosis, Poly substance abuse 3. Due to bladder management, bowel management, safety, skin/wound care, disease management, medication administration, pain management and patient education, does  the patient require 24 hr/day rehab nursing? Potentially 4. Does the patient require coordinated care of a physician, rehab nurse, PT (1-2 hrs/day, 5 days/week), OT (1-2 hrs/day, 5 days/week) and SLP (.5-1 hrs/day, 5 days/week) to address physical and functional deficits in the context of the above medical diagnosis(es)? Potentially Addressing deficits in the following areas: balance, endurance, locomotion, strength, transferring, bowel/bladder control, bathing, dressing, feeding, grooming, toileting and cognition 5. Can the patient actively participate in an intensive therapy program of at least 3 hrs of therapy per day at least 5 days per week? Potentially 6. The potential for patient to make measurable gains while on inpatient rehab is fair 7. Anticipated functional outcomes upon discharge from inpatient rehab are min assist  with PT, min assist with OT, supervision with SLP. 8. Estimated rehab length of stay to reach the above functional goals is: 14-20d 9. Does the patient have adequate social supports to accommodate these discharge functional goals? Potentially 10. Anticipated D/C setting: Home 11. Anticipated post D/C treatments: [facility] therapy 12. Overall Rehab/Functional Prognosis: fair  RECOMMENDATIONS: This patient's condition is appropriate for continued rehabilitative care in the following setting: CIR once able to tolerate PT/OT from pain standpoint Patient has agreed to participate in recommended program. Potentially Note that insurance prior authorization may be required for reimbursement for recommended care.  Comment:    04/23/2014

## 2014-04-24 ENCOUNTER — Encounter (HOSPITAL_COMMUNITY): Payer: Self-pay | Admitting: Physician Assistant

## 2014-04-24 DIAGNOSIS — I5042 Chronic combined systolic (congestive) and diastolic (congestive) heart failure: Secondary | ICD-10-CM

## 2014-04-24 DIAGNOSIS — F319 Bipolar disorder, unspecified: Secondary | ICD-10-CM

## 2014-04-24 DIAGNOSIS — E119 Type 2 diabetes mellitus without complications: Secondary | ICD-10-CM

## 2014-04-24 LAB — BASIC METABOLIC PANEL
Anion gap: 12 (ref 5–15)
BUN: 7 mg/dL (ref 6–23)
CO2: 23 mEq/L (ref 19–32)
Calcium: 8.4 mg/dL (ref 8.4–10.5)
Chloride: 102 mEq/L (ref 96–112)
Creatinine, Ser: 0.47 mg/dL — ABNORMAL LOW (ref 0.50–1.10)
GFR calc Af Amer: >90 mL/min (ref >90)
GFR calc non Af Amer: >90 mL/min (ref >90)
Glucose, Bld: 244 mg/dL — ABNORMAL HIGH (ref 70–99)
Potassium: 3.9 mEq/L (ref 3.7–5.3)
Sodium: 137 mEq/L (ref 137–147)

## 2014-04-24 LAB — GLUCOSE, CAPILLARY
Glucose-Capillary: 204 mg/dL — ABNORMAL HIGH (ref 70–99)
Glucose-Capillary: 206 mg/dL — ABNORMAL HIGH (ref 70–99)
Glucose-Capillary: 217 mg/dL — ABNORMAL HIGH (ref 70–99)
Glucose-Capillary: 204 mg/dL — ABNORMAL HIGH (ref 70–99)
Glucose-Capillary: 182 mg/dL — ABNORMAL HIGH (ref 70–99)

## 2014-04-24 LAB — LIPID PANEL
Cholesterol: 179 mg/dL (ref 0–200)
HDL: 32 mg/dL — ABNORMAL LOW (ref >39)
LDL Cholesterol: 113 mg/dL — ABNORMAL HIGH (ref 0–99)
Total CHOL/HDL Ratio: 5.6 RATIO
Triglycerides: 172 mg/dL — ABNORMAL HIGH (ref <150)
VLDL: 34 mg/dL (ref 0–40)

## 2014-04-24 LAB — HEMOGLOBIN A1C
Hgb A1c MFr Bld: 10.4 % — ABNORMAL HIGH (ref <5.7)
Mean Plasma Glucose: 252 mg/dL — ABNORMAL HIGH (ref <117)

## 2014-04-24 LAB — MAGNESIUM: Magnesium: 1.6 mg/dL (ref 1.5–2.5)

## 2014-04-24 LAB — PHOSPHORUS: Phosphorus: 4.2 mg/dL (ref 2.3–4.6)

## 2014-04-24 LAB — CK: Total CK: 2293 U/L — ABNORMAL HIGH (ref 7–177)

## 2014-04-24 LAB — T4, FREE: Free T4: 1.08 ng/dL (ref 0.80–1.80)

## 2014-04-24 LAB — TSH: TSH: 5.13 u[IU]/mL — ABNORMAL HIGH (ref 0.350–4.500)

## 2014-04-24 NOTE — Progress Notes (Signed)
Inpatient Diabetes Program Recommendations  AACE/ADA: New Consensus Statement on Inpatient Glycemic Control (2013)  Target Ranges:  Prepandial:   less than 140 mg/dL      Peak postprandial:   less than 180 mg/dL (1-2 hours)      Critically ill patients:  140 - 180 mg/dL     Results for [name], [name] (MRN [id number]) as of 04/24/2014 08:55  Ref. Range 04/23/2014 07:34 04/23/2014 11:32 04/23/2014 16:31 04/23/2014 22:38  Glucose-Capillary Latest Range: 70-99 mg/dL 177 (H) 210 (H) 206 (H) 204 (H)    Results for [name], [name] (MRN [id number]) as of 04/24/2014 08:55  Ref. Range 04/24/2014 07:48  Glucose-Capillary Latest Range: 70-99 mg/dL 206 (H)    Home DM meds:  Levemir 60 units daily Humalog 2-12 units tid per SSI    **Note that patient is currently only receiving Novolog Resistant SSI   MD- Please consider adding a portion of patient's home Levemir back to regimen- Could start with 1/3 home dose and titrate as needed- Levemir 20 units daily    Will follow Wyn Quaker RN, MSN, CDE Diabetes Coordinator Inpatient Diabetes Program Team Pager: [phone] (8a-10p)

## 2014-04-24 NOTE — Progress Notes (Signed)
I met with pt and her Mom at bedside. Mom reports pt found down in her front yard face down seated with her legs crossed. Likely down for about 8 hrs. We discussed pt's different physical rehab options of inpt rehab vs SNF depending on her tolerance level for increased therapy. Pt currently lives alone next door to her grandparents. Her Mom can assist at d/c as needed. I will follow her progress, but SNF rehab may also need to be pursued. Pt reports that she is a diabetic, bipolar with a seizure disorder due to her drug addiction. I will discuss with SW. [phone]

## 2014-04-24 NOTE — Plan of Care (Signed)
Problem: Phase II Progression Outcomes Goal: Progress activity as tolerated unless otherwise ordered Outcome: Progressing PT/OT consulted. Pt stayed in chair during the day.

## 2014-04-24 NOTE — Consult Note (Signed)
CARDIOLOGY CONSULT NOTE   Patient ID: [name] MRN: [id number] DOB/AGE: 39-Mar-1977 39 y.o.  Admit date: 04/20/2014  Primary Physician   Suzan Garibaldi, [place] [facility]   New (nahser) Reason for Consultation: CHF  HPI: [name] is a 38 y.o. female with a history of obesity, bipolar disorder, tobacco abuse, DM, HTN, pseudoseizures, cocaine and alcohol abuse, multiple admissions for substance abuse and involuntary commitment who was admitted on 04/20/14 after being found down in yard. Found to have elevated CK, glucose, lactic acid and CXR concerning for aspiration. UDS positive for cocaine. Patient unable to move lower extremities. She had an echo done during her admission which revealed reduced ejection fraction and global hypokinesis and cardiology was consulted.  On the day she was admitted, she admits to smoking cocaine and drinking gin but cannot recall how much. She was found face down in her yard and does not recall how she got there. She was not taking all of her psychiatric meds and other medications as prescribed. She has a 20 year history of crack cocaine abuse as well as alcohol, marijuana and tobacco abuse. She smokes crack cocaine about 2-3x week but sometimes goes as long as a month without using. The longest period of sobriety she has had is about 16 months. She's been to multiple rehabilitation centers with no success. She admits to a "mild heart attack" at [facility] about 11 years ago that occurred during a using binge. She often gets chest pain usually associated with using. She describes it as left-sided pressure that radiates to her left shoulder and is associated with shortness of breath, diaphoresis, nausea and vomiting. She denies exertional chest pain or shortness of breath although she is not very active. She lives at home with her grandparents and has never held down a job. She has a family history of heart disease in her maternal  grandfather who had his first MI in his 44s. She does not know her father. She also reports a history of tachycardia for which he takes Lopressor. She denies orthopnea, PND, or abdominal distention; but she does get lower extremity swelling sometimes.     Past Medical History  Diagnosis Date  . Diabetes mellitus without complication   . Hypertension   . Cocaine abuse   . Seizures      Past Surgical History  Procedure Laterality Date  . Cholecystectomy  2005    Allergies  Allergen Reactions  . Erythromycin Rash    I have reviewed the patient's current medications . amitriptyline  100 mg Oral QHS  . Chlorhexidine Gluconate Cloth  6 each Topical Q0600  . clonazePAM  1 mg Oral QHS  . famotidine  20 mg Oral BID  . feeding supplement (GLUCERNA SHAKE)  237 mL Oral TID BM  . haloperidol  5 mg Oral TID  . heparin  5,000 Units Subcutaneous 3 times per day  . insulin aspart  0-20 Units Subcutaneous TID WC  . ketorolac  30 mg Intravenous 4 times per day  . mupirocin ointment  1 application Nasal BID  . QUEtiapine  100 mg Oral QHS  . traMADol  100 mg Oral QID   . sodium chloride 75 mL/hr at 04/24/14 0522     Prior to Admission medications   Medication Sig Start Date End Date Taking? Authorizing Provider  amitriptyline (ELAVIL) 50 MG tablet Take 100 mg by mouth at bedtime.   Yes Historical Provider, MD  clonazePAM Bobbye [place]) 1  MG tablet Take 1 mg by mouth 3 (three) times daily.   Yes Historical Provider, MD  divalproex (DEPAKOTE ER) 500 MG 24 hr tablet Take 500 mg by mouth 3 (three) times daily.   Yes Historical Provider, MD  haloperidol (HALDOL) 5 MG tablet Take 5 mg by mouth 3 (three) times daily. 03/26/14  Yes Historical Provider, MD  insulin detemir (LEVEMIR) 100 UNIT/ML injection Inject 60 Units into the skin daily.   Yes Historical Provider, MD  insulin lispro (HUMALOG) 100 UNIT/ML injection Inject 2-12 Units into the skin 3 (three) times daily with meals.   Yes Historical  Provider, MD  QUEtiapine (SEROQUEL) 50 MG tablet Take 100 mg by mouth at bedtime.   Yes Historical Provider, MD     History   Social History  . Marital Status: Single    Spouse Name: N/A    Number of Children: N/A  . Years of Education: N/A   Occupational History  . Not on file.   Social History Main Topics  . Smoking status: Current Every Day Smoker -- 1.00 packs/day for 28 years  . Smokeless tobacco: Not on file  . Alcohol Use: Not on file     Comment: 1/5th white liqour    . Drug Use: Yes    Special: Cocaine  . Sexual Activity: Yes   Other Topics Concern  . Not on file   Social History Narrative  . No narrative on file    Family Status  Relation Status Death Age  . Other Alive    Family History  Problem Relation Age of Onset  . Hypertension Other      ROS:  Full 14 point review of systems complete and found to be negative unless listed above.  Physical Exam: Blood pressure 105/66, pulse 105, temperature 98 F (36.7 C), temperature source Oral, resp. rate 17, height 5\' 5"  (1.651 m), weight 245 lb 14.4 oz (111.54 kg), SpO2 100.00%.  General: Well developed, well nourished, female in no acute distress Head: Eyes PERRLA, No xanthomas.   Normocephalic and atraumatic, oropharynx without edema or exudate. Dentition:  Lungs: CTAB Heart: Tachycardic. S1 S2, no rub/gallop, Heart irregular rate and rhythm with S1, S2 no murmur. pulses are 2+ extrem.   Neck: No carotid bruits. No lymphadenopathy.  No JVD. Abdomen: Bowel sounds present, abdomen soft and non-tender without masses or hernias noted. Msk:  No spine or cva tenderness. No weakness, no joint deformities or effusions. Extremities: No clubbing or cyanosis.  Trace bilateral LE edema.  Neuro: Alert and oriented X 3. No focal deficits noted. Psych:  Good affect, responds appropriately Skin: No rashes or lesions noted.  Labs:   Lab Results  Component Value Date   WBC 9.4 04/22/2014   HGB 13.6 04/22/2014   HCT  40.9 04/22/2014   MCV 95.6 04/22/2014   PLT 228 04/22/2014     Recent Labs Lab 04/22/14 0555  04/24/14 0450  NA 140  < > 137  K 3.9  < > 3.9  CL 106  < > 102  CO2 19  < > 23  BUN 4*  < > 7  CREATININE 0.43*  < > 0.47*  CALCIUM 7.7*  < > 8.4  PROT 5.3*  --   --   BILITOT 0.4  --   --   ALKPHOS 84  --   --   ALT 86*  --   --   AST 160*  --   --   GLUCOSE 125*  < >  244*  ALBUMIN 1.7*  --   --   < > = values in this interval not displayed. Magnesium  Date Value Ref Range Status  04/24/2014 1.6  1.5 - 2.5 mg/dL Final    Recent Labs  04/23/14 0550 04/24/14 0450  CKTOTAL 6057* 2293*   Pro B Natriuretic peptide (BNP)  Date/Time Value Ref Range Status  04/21/2014  6:41 AM 4365.0* 0 - 125 pg/mL Final  04/20/2014  6:50 PM 4466.0* 0 - 125 pg/mL Final   Lipase  Date/Time Value Ref Range Status  01/27/2014  6:27 PM 56  11 - 59 U/L Final   TSH  Date/Time Value Ref Range Status  04/24/2014  6:10 AM 5.130* 0.350 - 4.500 uIU/mL Final    Echo: 2D ECHO  04/23/14 LV EF: 45% - 50% Study Conclusions - Left ventricle: The cavity size was normal. Wall thickness was increased in a pattern of mild LVH. Systolic function was mildly reduced. The estimated ejection fraction was in the range of 45% to 50%. Diffuse hypokinesis. Findings consistent with left ventricular diastolic dysfunction. Doppler parameters are consistent with high ventricular filling pressure. - Left atrium: The atrium was mildly dilated. - Right ventricle: The cavity size was normal. Systolic function was normal.   ECG:  Sinus tach.   Radiology:  No results found.  ASSESSMENT AND PLAN:    Principal Problem:   Rhabdomyolysis Active Problems:   Toxic encephalopathy   Lactic acid acidosis   Paraparesis of both lower limbs   HCAP (healthcare-associated pneumonia)   Pseudoseizure   Myositis   Cocaine abuse   Bipolar disorder, unspecified   Morbid obesity   DM type 2 (diabetes mellitus, type 2)- poorly  controlled  [name] is a 38 y.o. female with a history of obesity, bipolar disorder, tobacco abuse, DM, HTN, pseudoseizures, cocaine and alcohol abuse, multiple admissions for substance abuse and involuntary commitment who was admitted on 04/20/14 after being found down in yard. Found to have elevated CK, glucose, lactic acid and CXR concerning for aspiration. UDS positive for cocaine. Patient unable to move lower extremities. She had an echo done during her admission which revealed reduced ejection fraction and global hypokinesis and cardiology was consulted.  Chronic combined systolic and diastolic CHF- 2D ECHO this admission: LV EF: 45% - 50%,  mild LVH, diffuse hypokinesis. LV diastolic dysfunction, high ventricular filling pressure. Mild LA dilation. -- BNP elevated 4.4K, troponin elevated (peak 0.6). CXR w/ question of early infiltrate or atelectasis in the left lower lobe. -- She denies shortness of breath but has trace lower extremities edema -- New reduced EF. Would not recommend cardiac cath, she needs to quit cocaine.  Sinus tachycardia- chronic per patient. HR currently around 120bpm -- On Lopressor; however, BB is dangerous in the setting of cocaine  Rhabdomyolysis  -- Cont to hydrate - CK has come down significantly from 44,127 on admission    Acute toxic encephalopathy  -- Patient was not sure of how she got out to the yard  -- CT head negative  -- Suspect due to cocaine and alcohol intoxication  -- Resolved   Elevated LFTs  -- Improving- likely related to rhabdo- no fatty liver on CT   Paraparesis of both lower limbs/ Myositis/Left leg numbness -- Per IM and neuro   HCAP (healthcare-associated pneumonia) and b/l Maxillary sinusiits ( on CT)  -- b/l upper lobe on CT and CXR  -- Zosyn started on 6/28 but stopped the following day by PCCM  --  is completely asymptomatic  Cocaine and alcohol abuse  -- States she will quit   DM  -- Non- compliance with taking  Levemir regularly (has no insurance but gets samples from PCP)  -- Hg A1c in process   Signed: Perry Mount, PA-C 04/24/2014 1:07 PM  Pager [phone]  Co-Sign MD   Attending Note:   The patient was seen and examined.  Agree with assessment and plan as noted above.  Changes made to the above note as needed.  I had a long discussion with Pt and her mom.  She has a long hx of multisubstance abuse. Was admitted with unresponsiveness and rhabdomyolosis.   Troponin levels were minimally elevated.   She still has profound weakness of her right leg.  She remains tachycardic.  The echo shows diffuse hypokinesis with EF of 45-50%.  This is consistent with regular cocaine abuse.    We discussed that fact that she needs to stop using cocaine and that the cocaine would negate any beneficial effects that our medicines would contribute.  We discussed the fact that she was harming herself and that she could die during one of her binges.  She sees a Engineer, water and is on multiple psych meds. She needs to get into a rehab program.   I would be happy to see her in the office but she will need to have routine UDS to ensure that she remains clean - otherwise, visits to the cardiologist will be a waste of her time.   I would continue her metoprolol. Could add ACE-I and titrate as needed.   I have advised her to definitely stop using cocaine and to try to stop cigarette smoking and limit her alcohol intake.   No further recs,  Call for questions.   Thayer Headings, Brooke Bonito., MD, [facility] 04/24/2014, 2:38 PM [id number] [address],  [place] Pager [phone]

## 2014-04-24 NOTE — Progress Notes (Addendum)
[facility] Progress Note   [name] [id number] DOB: 02/01/1976 DOA: 04/20/2014 PCP: Suzan Garibaldi, FNP  Brief narrative: [name] is a 38 y.o. female  smoker, with PMH of DM, HTN, pseudoseizures, cocaine and alcohol abuse, multiple admissions for substance abuse and involuntary commitment admitted on 6/28 after being found down in yard. Found to have elevated CK, glucose, lactic acid and CXR concerning for aspiration. UDS positive for cocaine. Patient unable to move lower extremities. Grandmother at bedside has told me the patient was evaluated at [facility] for 3 days for her h/o seizures and was told that she had pseudoseizures. She continues to have these pseudoseizures at home.  On the day she was admitted, she admits to smoking cocaine and drinking gin but cannot recall how much. She was found face down in her yard and does not recall how she got there. She was taking all of her pscy meds appropriately at home for the past 2 wks but was taking her Levemir irregularly   SIGNIFICANT EVENTS and STUDIES:  6/28 - Admit after being found down - elevated CK, glucose, & lactic acid. CXR concerning for aspiration. UDS positive for cocaine, pt with no memory of events.  6/28 CT Head>>>neg for ICH but has ethmoid and maxillary sinusitis  6/28 CT C-Spine>>>no cervical fx  6/26 CT Chest >>>bilateral upper GGO  6/28 CT ABD/Pelvis>>>no acute abnormality, small bilateral renal lesions (? Cysts)  6/28 - MRI T spine: normal  6/28 - MRI L spine- no stenosis, severe gluteal, pelvic and erector spinae mysositis (pulses in lowers are dopplered +)    Subjective: Pain in back/ thighs much improved after starting Toradol and Ultram- she can now lift her legs. Numbness is the same. Eating very well now and finishing all of her meals. Appear quite happy today.   Assessment/Plan: Principal Problem:   Rhabdomyolysis - cont to hydrate - CK has come down significantly from 44,127 on  admission  Active Problems: Acute toxic encephalopathy - pt was not sure of how she got out to the yard -CT head negative - suspect cocaine and alcohol intoxication  - resolved  Elevated LFTs - improving- likely related to rhabdo- no fatty liver on CT    Paraparesis of both lower limbs/ Myositis - due to being on ground??- she was found face down but her myositis is in gluteal area, erector spinae (left greater than right) and adductor muscles (right more than left) - d/c narcotics - started Toradol and Tramadol for pain- since they are working so well, will cont for now - will need to stop Toradol in 3 days- cont Pepcid  Left leg numbness-  - have asked for a neuro consult to evaluate- they also suspect compression of nerves in relation to above myositis-     HCAP (healthcare-associated pneumonia) and b/l Maxillary sinusiits ( on CT) - b/l upper lobe on CT and CXR - Zosyn started on 6/28 but stopped the following day by PCCM - is completely asymptomatic  Renal cysts? 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  Cocaine and alcohol abuse - states she will quit - will have case management assist with giving info on local services  DM - non- compliance with taking Levemir regularly (has no insurance but gets samples from PCP) - eating much better- keep on current sliding scale and steadily increase Levemir  Bipolar - states she sleep a lot during the day  but she takes her psych meds as ordered - resume home meds- Clonazepam only QHS rather than TID, Haldol TID, Seroquel at bedtime- hold Depakote TID for now (note no Depakote level obtained on admission) - monitor for oversedation during the day - currently patient feels well with this regimen  Tachycardia - sinus tach - checking thyroid function- TSH high but free T4 pending - may be due to pain - ECHO shows LVH and diffuse hypokinesis- EF 45-50 % and LA is dilated- will ask  cardiology to evaluate  Anorexia -resolved    Code Status: full code Family Communication: with grandmother and grand father Disposition Plan: possibly CIR  Consultants: Neuro PCCM  Procedures: none  Antibiotics: Anti-infectives   Start     Dose/Rate Route Frequency Ordered Stop   04/20/14 2200  piperacillin-tazobactam (ZOSYN) IVPB 3.375 g  Status:  Discontinued     3.375 g 12.5 mL/hr over 240 Minutes Intravenous 3 times per day 04/20/14 1813 04/21/14 1430   04/20/14 1100  vancomycin (VANCOCIN) IVPB 1000 mg/200 mL premix     1,000 mg 200 mL/hr over 60 Minutes Intravenous  Once 04/20/14 1053 04/20/14 1407   04/20/14 1100  piperacillin-tazobactam (ZOSYN) IVPB 3.375 g     3.375 g 12.5 mL/hr over 240 Minutes Intravenous  Once 04/20/14 1053 04/20/14 1353       DVT prophylaxis: Heparin  Objective: Filed Weights   04/22/14 0500 04/22/14 2057 04/23/14 2252  Weight: 105.7 kg (233 lb 0.4 oz) 108.092 kg (238 lb 4.8 oz) 111.54 kg (245 lb 14.4 oz)    Vitals Filed Vitals:   04/23/14 1000 04/23/14 1739 04/23/14 2252 04/24/14 0515  BP: 120/83 100/66 96/69 105/66  Pulse: 122 109 107 105  Temp: 98.4 F (36.9 C) 98 F (36.7 C) 97.6 F (36.4 C) 98 F (36.7 C)  TempSrc: Oral Oral Oral Oral  Resp: 18 18 17 17   Height:      Weight:   111.54 kg (245 lb 14.4 oz)   SpO2: 98% 100% 100% 100%      Intake/Output Summary (Last 24 hours) at 04/24/14 1229 Last data filed at 04/24/14 0700  Gross per 24 hour  Intake   2280 ml  Output   2151 ml  Net    129 ml     Exam: General: No acute respiratory distress-smiling today Lungs: Clear to auscultation bilaterally without wheezes or crackles Cardiovascular: Regular rate and rhythm without murmur gallop or rub normal S1 and S2- tachycardic Abdomen: Nontender, nondistended, soft, bowel sounds positive- morbidly obese Extremities: No significant cyanosis, clubbing, or edema bilateral lower extremities-  MSK: lifting legs without  problems now Neuro: poor sensation to touch below the knee on right leg-   Data Reviewed: Basic Metabolic Panel:  Recent Labs Lab 04/20/14 1850 04/21/14 0641 04/22/14 0555 04/23/14 0550 04/24/14 0450  NA 140  139 137 140 136* 137  K 4.6  4.3 3.5* 3.9 3.8 3.9  CL 101  99 101 106 100 102  CO2 21  21 22 19 22 23   GLUCOSE 260*  259* 135* 125* 167* 244*  BUN 8  8 7  4* 4* 7  CREATININE 0.60  0.55 0.51 0.43* 0.44* 0.47*  CALCIUM 7.4*  7.3* 7.5* 7.7* 8.1* 8.4  MG  --  1.9 1.8 1.6 1.6  PHOS  --  2.5 2.2* 3.3 4.2   Liver Function Tests:  Recent Labs Lab 04/20/14 1020 04/20/14 1850 04/21/14 0641 04/22/14 0555  AST 223* 299* 257* 160*  ALT  75* 107* 107* 86*  ALKPHOS 79 68 66 84  BILITOT 0.3 0.2* 0.2* 0.4  PROT 7.3 6.4 5.5* 5.3*  ALBUMIN 3.4* 2.7* 2.1* 1.7*   No results found for this basename: LIPASE, AMYLASE,  in the last 168 hours No results found for this basename: AMMONIA,  in the last 168 hours CBC:  Recent Labs Lab 04/20/14 1020 04/20/14 1034 04/20/14 1850 04/21/14 0641 04/22/14 0555  WBC 16.8*  --  10.5 11.1* 9.4  NEUTROABS 14.0*  --  8.4*  --  6.6  HGB 16.9* 18.0* 14.4 14.7 13.6  HCT 47.9* 53.0* 41.0 43.5 40.9  MCV 92.6  --  92.1 94.6 95.6  PLT 386  --  331 278 228   Cardiac Enzymes:  Recent Labs Lab 04/20/14 1020 04/20/14 1850 04/21/14 0031 04/21/14 0641 04/21/14 0913 04/23/14 0550 04/24/14 0450  CKTOTAL 16109* 44127*  --  32031*  --  6057* 2293*  TROPONINI  --  0.85* 0.33* 0.59* 0.60*  --   --    BNP (last 3 results)  Recent Labs  04/20/14 1850 04/21/14 0641  PROBNP 4466.0* 4365.0*   CBG:  Recent Labs Lab 04/23/14 1132 04/23/14 1631 04/23/14 2238 04/24/14 0748 04/24/14 1206  GLUCAP 210* 206* 204* 206* 217*    Recent Results (from the past 240 hour(s))  CULTURE, BLOOD (ROUTINE X 2)     Status: None   Collection Time    04/20/14 10:54 AM      Result Value Ref Range Status   Specimen Description BLOOD LEFT WRIST    Final   Special Requests BOTTLES DRAWN AEROBIC AND ANAEROBIC 5CC   Final   Culture  Setup Time     Final   Value: 04/20/2014 22:49     Performed at [facility]   Culture     Final   Value:        BLOOD CULTURE RECEIVED NO GROWTH TO DATE CULTURE WILL BE HELD FOR 5 DAYS BEFORE ISSUING A FINAL NEGATIVE REPORT     Performed at [facility]   Report Status PENDING   Incomplete  CULTURE, BLOOD (ROUTINE X 2)     Status: None   Collection Time    04/20/14 10:59 AM      Result Value Ref Range Status   Specimen Description BLOOD RIGHT HAND   Final   Special Requests BOTTLES DRAWN AEROBIC ONLY 10CC   Final   Culture  Setup Time     Final   Value: 04/20/2014 22:49     Performed at [facility]   Culture     Final   Value:        BLOOD CULTURE RECEIVED NO GROWTH TO DATE CULTURE WILL BE HELD FOR 5 DAYS BEFORE ISSUING A FINAL NEGATIVE REPORT     Performed at [facility]   Report Status PENDING   Incomplete  MRSA PCR SCREENING     Status: Abnormal   Collection Time    04/20/14  6:01 PM      Result Value Ref Range Status   MRSA by PCR POSITIVE (*) NEGATIVE Final   Comment:            The GeneXpert MRSA Assay (FDA     approved for NASAL specimens     only), is one component of a     comprehensive MRSA colonization     surveillance program. It is not     intended to diagnose MRSA  infection nor to guide or     monitor treatment for     MRSA infections.     RESULT CALLED TO, READ BACK BY AND VERIFIED WITH:     ROYAL,K RN 2010 04/20/14 MITCHELL,L     Studies:  Recent x-ray studies have been reviewed in detail by the Attending Physician  Scheduled Meds:  Scheduled Meds: . amitriptyline  100 mg Oral QHS  . aspirin EC  81 mg Oral Daily  . Chlorhexidine Gluconate Cloth  6 each Topical Q0600  . clonazePAM  1 mg Oral QHS  . famotidine  20 mg Oral BID  . feeding supplement (GLUCERNA SHAKE)  237 mL Oral TID BM  . haloperidol  5 mg Oral TID  . heparin   5,000 Units Subcutaneous 3 times per day  . insulin aspart  0-20 Units Subcutaneous TID WC  . ketorolac  30 mg Intravenous 4 times per day  . mupirocin ointment  1 application Nasal BID  . QUEtiapine  100 mg Oral QHS  . traMADol  100 mg Oral QID   Continuous Infusions: . sodium chloride 75 mL/hr at 04/24/14 0522    Time spent on care of this patient: 3 min   [place], MD 04/24/2014, 12:29 PM  LOS: 4 days   [facility] Office  [phone] Pager - Text Page per Shea Evans   If 7PM-7AM, please contact night-coverage [email or web address]

## 2014-04-24 NOTE — Consult Note (Addendum)
NEURO HOSPITALIST CONSULT NOTE    Reason for Consult: left leg numbness  HPI:                                                                                                                                          [name] is an 38 y.o. female with a past medical history significant for HTN, DM with peripheral neuropathy, cocaine and alcohol abuse, multiple admissions for substance abuse and involuntary commitment, admitted on 6/28 after being found down in yard. Found to have rhabdomyolysis. Patient unable to move lower extremities and complaining of LE numbness. She indicated that both legs are numb from the knee down, mainly the right leg. This is described by the patient as a new sensation since the events that prompted her admission to the hospital. [name] expressed that she has neuropathy and her feet usually burn, but the numbness that she is experiencing now is something different. It is constant and travels from her knees all the way to her toes. She said that she has tingling in her left leg. Complains of pain localized to her lower back and also legs. MRI L-spine showed confluent severe muscle edema about the lower pelvis, affecting gluteal muscles and adductors. In addition, there is separate but similar involvement of the lower lumbar erector spinae muscles. No stenosis or significant root involvement. T-spine MRI showed no cord involvement. Denies HA, vertigo, double vision, difficulty swallowing, arms weakness, slurred speech, language or vision impairment.  Past Medical History  Diagnosis Date  . Diabetes mellitus without complication   . Hypertension   . Cocaine abuse   . Seizures     Past Surgical History  Procedure Laterality Date  . Cholecystectomy  2005    Family History  Problem Relation Age of Onset  . Hypertension Other     Social History:  reports that she has been smoking.  She does not have any smokeless tobacco history on  file. She reports that she uses illicit drugs (Cocaine). Her alcohol history is not on file.  Allergies  Allergen Reactions  . Erythromycin Rash    MEDICATIONS:  I have reviewed the patient's current medications.   ROS:                                                                                                                                       History obtained from the patient and chart review  General ROS: negative for - chills, fatigue, fever, night sweats,or weight loss Psychological ROS: negative for - behavioral disorder, hallucinations, memory difficulties, or suicidal ideation Ophthalmic ROS: negative for - blurry vision, double vision, eye pain or loss of vision ENT ROS: negative for - epistaxis, nasal discharge, oral lesions, sore throat, tinnitus or vertigo Allergy and Immunology ROS: negative for - hives or itchy/watery eyes Hematological and Lymphatic ROS: negative for - bleeding problems, bruising or swollen lymph nodes Endocrine ROS: negative for - galactorrhea, hair pattern changes, polydipsia/polyuria or temperature intolerance Respiratory ROS: negative for - cough, hemoptysis, shortness of breath or wheezing Cardiovascular ROS: negative for - chest pain, dyspnea on exertion, edema or irregular heartbeat Gastrointestinal ROS: negative for - abdominal pain, diarrhea, hematemesis, nausea/vomiting or stool incontinence Genito-Urinary ROS: negative for - dysuria, hematuria, incontinence or urinary frequency/urgency Musculoskeletal ROS: negative for - joint swelling Neurological ROS: as noted in HPI Dermatological ROS: negative for rash but new skin lesions over the anterior surface left leg.  Physical exam: pleasant female in no apparent distress.Blood pressure 96/69, pulse 107, temperature 97.6 F (36.4 C), temperature source Oral, resp. rate 17, height  5' 5"  (1.651 m), weight 111.54 kg (245 lb 14.4 oz), SpO2 100.00%. Head: normocephalic. Neck: supple, no bruits, no JVD. Cardiac: no murmurs. Lungs: clear. Abdomen: soft, no tender, no mass. Extremities: no edema. Neurologic Examination:                                                                                                      Mental Status: Alert, oriented, thought content appropriate.  Speech fluent without evidence of aphasia.  Able to follow 3 step commands without difficulty. Cranial Nerves: II: Discs flat bilaterally; Visual fields grossly normal, pupils equal, round, reactive to light and accommodation III,IV, VI: ptosis not present, extra-ocular motions intact bilaterally V,VII: smile symmetric, facial light touch sensation normal bilaterally VIII: hearing normal bilaterally IX,X: gag reflex present XI: bilateral shoulder shrug XII: midline tongue extension without atrophy or fasciculations Motor: 5/5 upper extremities bilaterally. 0/5 right LE proximally and distally. Trace movements side to side left LE. Tone and bulk:normal tone throughout; no atrophy noted Sensory: Pinprick and light touch intact impaired  bilateral LE, right greater than left. Deep Tendon Reflexes:  Right: Upper Extremity   Left: Upper extremity   biceps (C-5 to C-6) 2/4   biceps (C-5 to C-6) 2/4 tricep (C7) 2/4    triceps (C7) 2/4 Brachioradialis (C6) 2/4  Brachioradialis (C6) 2/4  Lower Extremity Lower Extremity  quadriceps (L-2 to L-4) 2/4   quadriceps (L-2 to L-4) 2/4 Achilles (S1) 2/4   Achilles (S1) 2/4  Plantars: Right: downgoing   Left: downgoing Cerebellar: normal finger-to-nose. Unable to test HKS due to bilateral LE weakness. Gait:  Unable to test.    No results found for this basename: cbc, bmp, coags, chol, tri, ldl, hga1c    Results for orders placed during the hospital encounter of 04/20/14 (from the past 48 hour(s))  GLUCOSE, CAPILLARY     Status: Abnormal    Collection Time    04/22/14  3:43 AM      Result Value Ref Range   Glucose-Capillary 131 (*) 70 - 99 mg/dL   Comment 1 Notify RN    MAGNESIUM     Status: None   Collection Time    04/22/14  5:55 AM      Result Value Ref Range   Magnesium 1.8  1.5 - 2.5 mg/dL  PHOSPHORUS     Status: Abnormal   Collection Time    04/22/14  5:55 AM      Result Value Ref Range   Phosphorus 2.2 (*) 2.3 - 4.6 mg/dL  COMPREHENSIVE METABOLIC PANEL     Status: Abnormal   Collection Time    04/22/14  5:55 AM      Result Value Ref Range   Sodium 140  137 - 147 mEq/L   Potassium 3.9  3.7 - 5.3 mEq/L   Chloride 106  96 - 112 mEq/L   CO2 19  19 - 32 mEq/L   Glucose, Bld 125 (*) 70 - 99 mg/dL   BUN 4 (*) 6 - 23 mg/dL   Creatinine, Ser 0.43 (*) 0.50 - 1.10 mg/dL   Calcium 7.7 (*) 8.4 - 10.5 mg/dL   Total Protein 5.3 (*) 6.0 - 8.3 g/dL   Albumin 1.7 (*) 3.5 - 5.2 g/dL   AST 160 (*) 0 - 37 U/L   Comment: HEMOLYSIS AT THIS LEVEL MAY AFFECT RESULT   ALT 86 (*) 0 - 35 U/L   Alkaline Phosphatase 84  39 - 117 U/L   Total Bilirubin 0.4  0.3 - 1.2 mg/dL   GFR calc non Af Amer >90  >90 mL/min   GFR calc Af Amer >90  >90 mL/min   Comment: (NOTE)     The eGFR has been calculated using the CKD EPI equation.     This calculation has not been validated in all clinical situations.     eGFR's persistently <90 mL/min signify possible Chronic Kidney     Disease.  CBC WITH DIFFERENTIAL     Status: None   Collection Time    04/22/14  5:55 AM      Result Value Ref Range   WBC 9.4  4.0 - 10.5 K/uL   RBC 4.28  3.87 - 5.11 MIL/uL   Hemoglobin 13.6  12.0 - 15.0 g/dL   HCT 40.9  36.0 - 46.0 %   MCV 95.6  78.0 - 100.0 fL   MCH 31.8  26.0 - 34.0 pg   MCHC 33.3  30.0 - 36.0 g/dL   RDW 13.0  11.5 - 15.5 %   Platelets  228  150 - 400 K/uL   Neutrophils Relative % 71  43 - 77 %   Neutro Abs 6.6  1.7 - 7.7 K/uL   Lymphocytes Relative 19  12 - 46 %   Lymphs Abs 1.7  0.7 - 4.0 K/uL   Monocytes Relative 10  3 - 12 %   Monocytes  Absolute 0.9  0.1 - 1.0 K/uL   Eosinophils Relative 0  0 - 5 %   Eosinophils Absolute 0.0  0.0 - 0.7 K/uL   Basophils Relative 0  0 - 1 %   Basophils Absolute 0.0  0.0 - 0.1 K/uL  PROCALCITONIN     Status: None   Collection Time    04/22/14  5:55 AM      Result Value Ref Range   Procalcitonin 0.14     Comment:            Interpretation:     PCT (Procalcitonin) <= 0.5 ng/mL:     Systemic infection (sepsis) is not likely.     Local bacterial infection is possible.     (NOTE)             ICU PCT Algorithm               Non ICU PCT Algorithm        ----------------------------     ------------------------------             PCT < 0.25 ng/mL                 PCT < 0.1 ng/mL         Stopping of antibiotics            Stopping of antibiotics           strongly encouraged.               strongly encouraged.        ----------------------------     ------------------------------           PCT level decrease by               PCT < 0.25 ng/mL           >= 80% from peak PCT           OR PCT 0.25 - 0.5 ng/mL          Stopping of antibiotics                                                 encouraged.         Stopping of antibiotics               encouraged.        ----------------------------     ------------------------------           PCT level decrease by              PCT >= 0.25 ng/mL           < 80% from peak PCT            AND PCT >= 0.5 ng/mL            Continuing antibiotics  encouraged.           Continuing antibiotics                encouraged.        ----------------------------     ------------------------------         PCT level increase compared          PCT > 0.5 ng/mL             with peak PCT AND              PCT >= 0.5 ng/mL             Escalation of antibiotics                                              strongly encouraged.          Escalation of antibiotics            strongly encouraged.  GLUCOSE, CAPILLARY     Status:  Abnormal   Collection Time    04/22/14  7:38 AM      Result Value Ref Range   Glucose-Capillary 142 (*) 70 - 99 mg/dL  GLUCOSE, CAPILLARY     Status: Abnormal   Collection Time    04/22/14  1:12 PM      Result Value Ref Range   Glucose-Capillary 172 (*) 70 - 99 mg/dL  GLUCOSE, CAPILLARY     Status: Abnormal   Collection Time    04/22/14  3:43 PM      Result Value Ref Range   Glucose-Capillary 197 (*) 70 - 99 mg/dL   Comment 1 Notify RN    GLUCOSE, CAPILLARY     Status: Abnormal   Collection Time    04/22/14  8:56 PM      Result Value Ref Range   Glucose-Capillary 240 (*) 70 - 99 mg/dL  GLUCOSE, CAPILLARY     Status: Abnormal   Collection Time    04/23/14  1:49 AM      Result Value Ref Range   Glucose-Capillary 153 (*) 70 - 99 mg/dL  MAGNESIUM     Status: None   Collection Time    04/23/14  5:50 AM      Result Value Ref Range   Magnesium 1.6  1.5 - 2.5 mg/dL  PHOSPHORUS     Status: None   Collection Time    04/23/14  5:50 AM      Result Value Ref Range   Phosphorus 3.3  2.3 - 4.6 mg/dL  BASIC METABOLIC PANEL     Status: Abnormal   Collection Time    04/23/14  5:50 AM      Result Value Ref Range   Sodium 136 (*) 137 - 147 mEq/L   Potassium 3.8  3.7 - 5.3 mEq/L   Chloride 100  96 - 112 mEq/L   CO2 22  19 - 32 mEq/L   Glucose, Bld 167 (*) 70 - 99 mg/dL   BUN 4 (*) 6 - 23 mg/dL   Creatinine, Ser 0.44 (*) 0.50 - 1.10 mg/dL   Calcium 8.1 (*) 8.4 - 10.5 mg/dL   GFR calc non Af Amer >90  >90 mL/min   GFR calc Af Amer >90  >90 mL/min   Comment: (NOTE)     The eGFR has been calculated using the  CKD EPI equation.     This calculation has not been validated in all clinical situations.     eGFR's persistently <90 mL/min signify possible Chronic Kidney     Disease.  CK     Status: Abnormal   Collection Time    04/23/14  5:50 AM      Result Value Ref Range   Total CK 6057 (*) 7 - 177 U/L  GLUCOSE, CAPILLARY     Status: Abnormal   Collection Time    04/23/14  7:34 AM       Result Value Ref Range   Glucose-Capillary 177 (*) 70 - 99 mg/dL  GLUCOSE, CAPILLARY     Status: Abnormal   Collection Time    04/23/14 11:32 AM      Result Value Ref Range   Glucose-Capillary 210 (*) 70 - 99 mg/dL  GLUCOSE, CAPILLARY     Status: Abnormal   Collection Time    04/23/14  4:31 PM      Result Value Ref Range   Glucose-Capillary 206 (*) 70 - 99 mg/dL  GLUCOSE, CAPILLARY     Status: Abnormal   Collection Time    04/23/14 10:38 PM      Result Value Ref Range   Glucose-Capillary 204 (*) 70 - 99 mg/dL    Dg Chest Port 1 View  04/22/2014   CLINICAL DATA:  Assess edema  EXAM: PORTABLE CHEST - 1 VIEW  COMPARISON:  04/21/2014  FINDINGS: Heart size and pulmonary vascularity are within normal limits.  The right lung remains clear. Question mild left lower lobe atelectasis/infiltrate and small left effusion. Slight increase in left lower lobe opacity compared with yesterday.  IMPRESSION: Question early infiltrate or atelectasis in the left lower lobe.   Electronically Signed   By: Franchot Gallo M.D.   On: 04/22/2014 07:20   Assessment/Plan: 38 y/o with new onset paraparesis and numbness right greater than left numbness. She has preserved deep tendon reflexes, no sensory level, and no evidence of cord involvement on MRI.Patient has an underlying diabetic neuropathy but as per patient description she never had this type of numbness. MRI L-spine showed findings suggestive of a severe myositis in the setting of generalized rhabdomyolysis The pattern of patient's numbness and weakness seems to have a neuropathic pattern thus an axonal sensory-motor neuropathy as well as a lumbosacral plexopathy due to severe compressive myositis in the setting of generalized rhabdomyolysis are plausible explanations.  There is severe bilateral muscle edema also in the visible gluteus and other deep adductor muscles (piriformis right greater than left) which also raises concern for gluteal compartment  syndrome (rare). NCS/EMG (no available in the hospital). Will follow up.   Dorian Pod, MD 04/24/2014, 12:21 AM

## 2014-04-25 DIAGNOSIS — F1410 Cocaine abuse, uncomplicated: Secondary | ICD-10-CM

## 2014-04-25 DIAGNOSIS — I509 Heart failure, unspecified: Secondary | ICD-10-CM

## 2014-04-25 DIAGNOSIS — I5022 Chronic systolic (congestive) heart failure: Secondary | ICD-10-CM

## 2014-04-25 LAB — GLUCOSE, CAPILLARY
Glucose-Capillary: 276 mg/dL — ABNORMAL HIGH (ref 70–99)
Glucose-Capillary: 115 mg/dL — ABNORMAL HIGH (ref 70–99)
Glucose-Capillary: 223 mg/dL — ABNORMAL HIGH (ref 70–99)
Glucose-Capillary: 225 mg/dL — ABNORMAL HIGH (ref 70–99)

## 2014-04-25 LAB — CK: Total CK: 1208 U/L — ABNORMAL HIGH (ref 7–177)

## 2014-04-25 MED ORDER — INSULIN DETEMIR 100 UNIT/ML ~~LOC~~ SOLN
20.0000 [IU] | Freq: Every day | SUBCUTANEOUS | Status: DC
  Administered 2014-04-25 – 2014-04-27 (×3): 20 [IU] via SUBCUTANEOUS
  Filled 2014-04-25 (×4): qty 0.2

## 2014-04-25 MED ORDER — DOXYCYCLINE HYCLATE 100 MG PO TABS
100.0000 mg | ORAL_TABLET | Freq: Two times a day (BID) | ORAL | Status: DC
  Administered 2014-04-25 – 2014-04-26 (×3): 100 mg via ORAL
  Filled 2014-04-25 (×4): qty 1

## 2014-04-25 NOTE — Progress Notes (Addendum)
Physical Therapy Treatment Patient Details Name: [name] MRN: [id number] DOB: 20-Nov-1975 Today's Date: 04/25/2014    History of Present Illness Pt admit after being found down in back yard for unknown time.  Polysubstance abuse with cocaine +.  AMS and rhabdomyolysis.      PT Comments    This therapist very pleased with pt's pain tolerance and activity tolerance today.  She presents as an incomplete para and indicates she is getting some feeling and strength back in her LEs.  If mom can provide needed level of assist at time of DC, I expect she would make significant gains on IP rehab. I expect her to function at W/C level during her rehab stay.  I assigned pt. Several tasks to be responsible for and have asked RN to monitor pt's ability to assume responsibility for these tasks.  Pt. Is able to shift her weight from hip to hip for pressure relief while sitting up in recliner (she could demo, and was instructed to do so).  She reports she does have sensation in her buttocks bilaterally so should be limited risk for skin breakdown while sitting.  PT/OT co-treat session due to pt/therapist safety concerns and  Pt. complexity .  I discussed with [name], RN the need to use lift equipment back to bed and pt. OOB over weekend.    Follow Up Recommendations  CIR;Supervision/Assistance - 24 hour     Equipment Recommendations  Other (comment) (TBD, likely will need w/c and cushion)    Recommendations for Other Services Rehab consult (completed)     Precautions / Restrictions Precautions Precautions: Fall Restrictions Weight Bearing Restrictions: No    Mobility  Bed Mobility Overal bed mobility: Needs Assistance Bed Mobility: Supine to Sit     Supine to sit: HOB elevated;Mod assist     General bed mobility comments: pt. initiated LE movements toward edge of bed, mod assist to come to sitting at EOB  Transfers Overall transfer level: Needs assistance Equipment used: None Transfers:  Sit to/from Stand;Lateral/Scoot Transfers Sit to Stand: +2 physical assistance;Max assist;From elevated surface        Lateral/Scoot Transfers: +2 physical assistance;Max assist General transfer comment: Stood at EOB briefly x 2 trials with +2 max assist and ed elevated.  Pt. able to stand max of 8 seconds on second trial.  Unable to extend hips in standing.  difficult especially for her to achieve foot flat on right foot, but more successful with left foot.  Poor knee control in standing position.  Then, completed lateral transfer from bed to drop arm recliner chair with heavy use of bedpad by therapists and asssist from pt. to shift along side of bed for best transfer positioning.  She could complete lateral scoot toward HOB with min assist x 2 scoots.    Ambulation/Gait Ambulation/Gait assistance:  (pt. unable )               Stairs            Wheelchair Mobility    Modified Rankin (Stroke Patients Only)       Balance Overall balance assessment: Needs assistance Sitting-balance support: No upper extremity supported;Feet supported Sitting balance-Leahy Scale: Fair (suspect pt. would be at a good level if on firm surface)                              Cognition Arousal/Alertness: Awake/alert Behavior During Therapy: WFL for tasks assessed/performed Overall  Cognitive Status: Within Functional Limits for tasks assessed                 General Comments: Pt. able to follow directions and participate fully in session.  I gave pt. several tasks to be responsible for and she seems prepared to assume responsibility for these tasks (notifying nursing staff of 2 hour on 2 hour off schedule for [employer] positioning boots. stretching her heel cords with safety belt in pt. room while procare bootys off and completing knee extension exercises in sitting position while up in chair and in bed 3x/day    Exercises General Exercises - Lower Extremity Ankle Circles/Pumps:  AROM;AAROM;Both;5 reps;Supine Quad Sets: AROM;Both;10 reps;Seated Other Exercises Other Exercises: heel cord stretches using pink gait belt in pt's room bilaterally; pt. able to complete stretches independently once gait belt in place.  She passively has at least 20 degreees DF.    General Comments        Pertinent Vitals/Pain See vitals tab No distress, tolerating pain during activity/therapy session    Home Living                      Prior Function            PT Goals (current goals can now be found in the care plan section) Progress towards PT goals: Progressing toward goals    Frequency  Min 3X/week    PT Plan Current plan remains appropriate    Co-evaluation             End of Session Equipment Utilized During Treatment: Gait belt         Time: [id number] PT Time Calculation (min): 38 min  Charges:  $Therapeutic Exercise: 8-22 mins $Therapeutic Activity: 8-22 mins                    G Codes:      Ladona Ridgel 04/25/2014, 12:09 PM Gerlean Ren PT Acute Rehab Services [place] [phone]

## 2014-04-25 NOTE — Progress Notes (Signed)
Occupational Therapy Treatment Patient Details Name: [name] MRN: [id number] DOB: 22-Jan-1976 Today's Date: 04/25/2014    History of present illness Pt admit after being found down in back yard for unknown time.  Polysubstance abuse with cocaine +.  AMS and rhabdomyolysis.     OT comments  Pt with improved activity tolerance today and able to transfer to drop arm recliner (via scooting) with +2 assist.  Pt with good use of bilateral UEs to assist with transfer. Continue to recommend CIR.  Follow Up Recommendations  CIR    Equipment Recommendations   (TBD)    Recommendations for Other Services Rehab consult    Precautions / Restrictions Precautions Precautions: Fall Restrictions Weight Bearing Restrictions: No       Mobility Bed Mobility Overal bed mobility: Needs Assistance Bed Mobility: Supine to Sit     Supine to sit: HOB elevated;Mod assist     General bed mobility comments: pt. initiated LE movements toward edge of bed, mod assist to come to sitting at EOB  Transfers Overall transfer level: Needs assistance Equipment used: Rolling walker (2 wheeled) Transfers: Sit to/from Stand;Lateral/Scoot Transfers Sit to Stand: +2 physical assistance;Max assist;From elevated surface        Lateral/Scoot Transfers: +2 physical assistance;Max assist General transfer comment: Stood at EOB briefly x 2 trials with +2 max assist and ed elevated.  Pt. able to stand max of 8 seconds on second trial.  Unable to extend hips in standing.  difficult especially for her to achieve foot flat on right foot, but more successful with left foot.  Poor knee control in standing position.  Then, completed lateral transfer from bed to drop arm recliner chair with heavy use of bedpad by therapists and asssist from pt. to shift along side of bed for best transfer positioning.  She could complete lateral scoot toward HOB with min assist x 2 scoots.      Balance Overall balance assessment: Needs  assistance Sitting-balance support: No upper extremity supported;Feet supported Sitting balance-Leahy Scale: Fair (suspect pt. would be at a good level if on firm surface)                             ADL       Grooming: Wash/dry hands;Wash/dry face;Brushing hair;Set up;Sitting               Lower Body Dressing: Total assistance Lower Body Dressing Details (indicate cue type and reason): donned socks Toilet Transfer: +2 for physical assistance;Maximal assistance;Requires drop arm Toilet Transfer Details (indicate cue type and reason): pt transferred to drop arm recliner with assist for scooting hips over with use of draw pad Toileting- Clothing Manipulation and Hygiene: Moderate assistance;Sitting/lateral lean         General ADL Comments: Pt able to simulate toileting hygiene while sitting EOB with lateral leans to left and right sides.        Vision                     Perception     Praxis      Cognition   Behavior During Therapy: [facility] for tasks assessed/performed Overall Cognitive Status: Within Functional Limits for tasks assessed                  General Comments: Pt. able to follow directions and participate fully in session.  I gave pt. several tasks to be responsible for and she  seems prepared to assume responsibility for these tasks (notifying nursing staff of 2 hour on 2 hour off schedule for [employer] positioning boots. stretching her heel cords with safety belt in pt. room while pro[employer] bootys off and completing knee extension exercises in sitting position while up in chair and in bed 3x/day    Extremity/Trunk Assessment               Exercises General Exercises - Lower Extremity Ankle Circles/Pumps: AROM;AAROM;Both;5 reps;Supine Quad Sets: AROM;Both;10 reps;Seated Other Exercises Other Exercises: heel cord stretches using pink gait belt in pt's room bilaterally; pt. able to complete stretches independently once gait belt in  place.  She passively has at least 20 degreees DF.   Shoulder Instructions       General Comments      Pertinent Vitals/ Pain       See vitals tab  Home Living                                          Prior Functioning/Environment              Frequency Min 2X/week     Progress Toward Goals  OT Goals(current goals can now be found in the care plan section)  Progress towards OT goals: Progressing toward goals  Acute Rehab OT Goals Patient Stated Goal: to go to rehab ADL Goals Pt Will Perform Grooming: with min guard assist;with supervision;with set-up;sitting Pt Will Perform Upper Body Bathing: with min guard assist;with supervision;with set-up;sitting Pt Will Perform Lower Body Bathing: with max assist;with mod assist;sitting/lateral leans Pt Will Perform Upper Body Dressing: with min guard assist;with supervision;with set-up;sitting Pt Will Transfer to Toilet: with max assist;bedside commode Pt Will Perform Toileting - Clothing Manipulation and hygiene: with max assist;with mod assist;sitting/lateral leans;sit to/from stand  Plan Discharge plan remains appropriate    Co-evaluation                 End of Session Equipment Utilized During Treatment: Rolling walker;Gait belt   Activity Tolerance Patient tolerated treatment well   Patient Left in chair;with call bell/phone within reach   Nurse Communication Need for lift equipment;Mobility status        Time: 1055-1130 OT Time Calculation (min): 35 min  Charges: OT General Charges $OT Visit: 1 Procedure OT Treatments $Self Care/Home Management : 8-22 mins  Darrol Jump 04/25/2014, 3:26 PM 04/25/2014 Darrol Jump OTR/L Pager [phone] Office [phone]

## 2014-04-25 NOTE — Progress Notes (Signed)
Noted progress with therapy tolerance today. I will follow up on Monday with her continued tolerance to discuss inpt rehab admission vs SNF for her disposition. [id number]

## 2014-04-25 NOTE — Care Management Note (Unsigned)
    Page 1 of 1   04/25/2014     12:33:22 PM CARE MANAGEMENT NOTE 04/25/2014  Patient:  [name], [name]   Account Number:  [id number]  Date Initiated:  04/21/2014  Documentation initiated by:  [facility]  Subjective/Objective Assessment:   Found down - postitive for cocaine.     Action/Plan:   Anticipated DC Date:  04/25/2014   Anticipated DC Plan:  HOME/SELF CARE  In-house referral  Clinical Social Worker      DC Planning Services  CM consult      Choice offered to / List presented to:             Status of service:  In process, will continue to follow Medicare Important Message given?  NO (If response is "NO", the following Medicare IM given date fields will be blank) Date Medicare IM given:   Medicare IM given by:   Date Additional Medicare IM given:   Additional Medicare IM given by:    Discharge Disposition:    Per UR Regulation:    If discussed at Long Length of Stay Meetings, dates discussed:    Comments:  Contact:  Comer,Cindy Mother [phone]                Pugh,Floyd Grandfather [phone]    04-25-14 1227 Jacqlyn Krauss, RN,BSN [phone] CIR consult placed and CIR  will monitor for progression. SNF will be utilized as a back up plan. Pt with hx of obesity, bipolar disorder, tobacco abuse, DM, HTN, pseudoseizures, cocaine and alcohol abuse, multiple admissions for substance abuse and involuntary commitment who was admitted on 04/20/14 after being found down in yard. CM will continue to monitor for disposition needs.  04-21-14 7:50am Luz Lex, [place] SW consult placed.

## 2014-04-25 NOTE — Progress Notes (Signed)
Inpatient Diabetes Program Recommendations  AACE/ADA: New Consensus Statement on Inpatient Glycemic Control (2013)  Target Ranges:  Prepandial:   less than 140 mg/dL      Peak postprandial:   less than 180 mg/dL (1-2 hours)      Critically ill patients:  140 - 180 mg/dL     Results for [name], [name] (MRN [id number]) as of 04/25/2014 09:27  Ref. Range 04/24/2014 07:48 04/24/2014 12:06 04/24/2014 17:11 04/24/2014 20:13  Glucose-Capillary Latest Range: 70-99 mg/dL 206 (H) 217 (H) 204 (H) 182 (H)    Results for [name], [name] (MRN [id number]) as of 04/25/2014 09:27  Ref. Range 04/25/2014 07:35  Glucose-Capillary Latest Range: 70-99 mg/dL 276 (H)      Home DM meds:   Levemir 60 units daily  Humalog 2-12 units tid per SSI    **Note that patient is currently only receiving Novolog Resistant SSI    MD- Please consider adding a portion of patient's home Levemir back to regimen- Could start with 1/3 home dose and titrate as needed- Levemir 20 units daily    Will follow Heather Quaker RN, MSN, CDE Diabetes Coordinator Inpatient Diabetes Program Team Pager: [phone] (8a-10p)

## 2014-04-25 NOTE — Progress Notes (Signed)
Subjective: [name] is an 38 y.o. female with a past medical history significant for HTN, DM with peripheral neuropathy, cocaine and alcohol abuse, multiple admissions for substance abuse and involuntary commitment, admitted on 6/28 after being found down in yard. Found to have rhabdomyolysis. Patient was unable to move lower extremities and was complaining of LE numbness.   The patient reports continued low back pain radiating into the left gluteal area. She currently rates her pain as a 6 on a scale of 1-10. She is receiving Toradol and tramadol for pain with some relief. She also reports continued weakness and numbness of the lower extremities; however, this does appear to be improving. She feels her peripheral neuropathy is worse and states the bottom of her feet feel as if someone is scraping them with sandpaper.  Currently on MRSA precautions.     Objective: Current vital signs: BP 110/75  Pulse 103  Temp(Src) 97.9 F (36.6 C) (Oral)  Resp 18  Ht 5\' 5"  (1.651 m)  Wt 248 lb 3.8 oz (112.6 kg)  BMI 41.31 kg/m2  SpO2 95% Vital signs in last 24 hours: Temp:  [97.9 F (36.6 C)-98.4 F (36.9 C)] 97.9 F (36.6 C) (07/03 0516) Pulse Rate:  [102-106] 103 (07/03 0516) Resp:  [16-18] 18 (07/03 0516) BP: (110-126)/(63-83) 110/75 mmHg (07/03 0516) SpO2:  [95 %-100 %] 95 % (07/03 0516) Weight:  [248 lb 3.8 oz (112.6 kg)] 248 lb 3.8 oz (112.6 kg) (07/02 2015)  Intake/Output from previous day: 07/02 0701 - 07/03 0700 In: 720 [P.O.:720] Out: 4000 [Urine:4000] Intake/Output this shift:   Nutritional status: Carb Control  Physical Exam  Neurologic Exam:  MENTAL STATUS: awake, alert, Language fluent Follows simple commands. Naming intact   CRANIAL NERVES: pupils equal and reactive to light, visual fields full to confrontation, extraocular muscles intact, no nystagmus, facial sensation and strength symmetric, uvula midline, shoulder shrug symmetric, tongue midline, Corneal  reflex,  MOTOR: normal bulk and tone, Both upper extremities 5/5. Right lower extremity 3/5. Left lower extremity 4/5. SENSORY: Sensation intact to light touch throughout except for the right lower extremity which is significantly diminished. COORDINATION: finger-nose-finger normal - heel to shin normal - rapid alternating movements normal REFLEXES: deep tendon reflexes somewhat diminished but present and symmetric - no babinski GAIT/STATION: Did not attempt ambulation   Lab Results: Basic Metabolic Panel:  Recent Labs Lab 04/20/14 1850 04/21/14 0641 04/22/14 0555 04/23/14 0550 04/24/14 0450  NA 140  139 137 140 136* 137  K 4.6  4.3 3.5* 3.9 3.8 3.9  CL 101  99 101 106 100 102  CO2 21  21 22 19 22 23   GLUCOSE 260*  259* 135* 125* 167* 244*  BUN 8  8 7  4* 4* 7  CREATININE 0.60  0.55 0.51 0.43* 0.44* 0.47*  CALCIUM 7.4*  7.3* 7.5* 7.7* 8.1* 8.4  MG  --  1.9 1.8 1.6 1.6  PHOS  --  2.5 2.2* 3.3 4.2    Liver Function Tests:  Recent Labs Lab 04/20/14 1020 04/20/14 1850 04/21/14 0641 04/22/14 0555  AST 223* 299* 257* 160*  ALT 75* 107* 107* 86*  ALKPHOS 79 68 66 84  BILITOT 0.3 0.2* 0.2* 0.4  PROT 7.3 6.4 5.5* 5.3*  ALBUMIN 3.4* 2.7* 2.1* 1.7*   No results found for this basename: LIPASE, AMYLASE,  in the last 168 hours No results found for this basename: AMMONIA,  in the last 168 hours  CBC:  Recent Labs Lab 04/20/14 1020 04/20/14  1034 04/20/14 1850 04/21/14 0641 04/22/14 0555  WBC 16.8*  --  10.5 11.1* 9.4  NEUTROABS 14.0*  --  8.4*  --  6.6  HGB 16.9* 18.0* 14.4 14.7 13.6  HCT 47.9* 53.0* 41.0 43.5 40.9  MCV 92.6  --  92.1 94.6 95.6  PLT 386  --  331 278 228    Cardiac Enzymes:  Recent Labs Lab 04/20/14 1020 04/20/14 1850 04/21/14 0031 04/21/14 0641 04/21/14 0913 04/23/14 0550 04/24/14 0450 04/25/14 0500  CKTOTAL 60454* 44127*  --  32031*  --  6057* 2293* 1208*  TROPONINI  --  0.85* 0.33* 0.59* 0.60*  --   --   --     Lipid  Panel:  Recent Labs Lab 04/24/14 1430  CHOL 179  TRIG 172*  HDL 32*  CHOLHDL 5.6  VLDL 34  LDLCALC 113*    CBG:  Recent Labs Lab 04/24/14 0748 04/24/14 1206 04/24/14 1711 04/24/14 2013 04/25/14 0735  GLUCAP 206* 217* 204* 182* 54*    Microbiology: Results for orders placed during the hospital encounter of 04/20/14  CULTURE, BLOOD (ROUTINE X 2)     Status: None   Collection Time    04/20/14 10:54 AM      Result Value Ref Range Status   Specimen Description BLOOD LEFT WRIST   Final   Special Requests BOTTLES DRAWN AEROBIC AND ANAEROBIC 5CC   Final   Culture  Setup Time     Final   Value: 04/20/2014 22:49     Performed at [facility]   Culture     Final   Value:        BLOOD CULTURE RECEIVED NO GROWTH TO DATE CULTURE WILL BE HELD FOR 5 DAYS BEFORE ISSUING A FINAL NEGATIVE REPORT     Performed at [facility]   Report Status PENDING   Incomplete  CULTURE, BLOOD (ROUTINE X 2)     Status: None   Collection Time    04/20/14 10:59 AM      Result Value Ref Range Status   Specimen Description BLOOD RIGHT HAND   Final   Special Requests BOTTLES DRAWN AEROBIC ONLY 10CC   Final   Culture  Setup Time     Final   Value: 04/20/2014 22:49     Performed at [facility]   Culture     Final   Value:        BLOOD CULTURE RECEIVED NO GROWTH TO DATE CULTURE WILL BE HELD FOR 5 DAYS BEFORE ISSUING A FINAL NEGATIVE REPORT     Performed at [facility]   Report Status PENDING   Incomplete  MRSA PCR SCREENING     Status: Abnormal   Collection Time    04/20/14  6:01 PM      Result Value Ref Range Status   MRSA by PCR POSITIVE (*) NEGATIVE Final   Comment:            The GeneXpert MRSA Assay (FDA     approved for NASAL specimens     only), is one component of a     comprehensive MRSA colonization     surveillance program. It is not     intended to diagnose MRSA     infection nor to guide or     monitor treatment for     MRSA infections.      RESULT CALLED TO, READ BACK BY AND VERIFIED WITH:     ROYAL,K RN 2010 04/20/14 MITCHELL,L  Coagulation Studies: No results found for this basename: LABPROT, INR,  in the last 72 hours  Imaging:   MR LUMBAR SPINE IMPRESSION  04/20/2014 1. Confluent severe muscle edema about the lower pelvis, affecting  gluteal muscles and adductors. Separate but similar involvement of  the lower lumbar erector spinae muscles.  Considering the patient was found down, this may reflect severe  myositis in the setting of generalized rhabdomyolysis. Infectious  myositis is the main differential consideration.  2. No lumbar spinal stenosis. Mild lower lumbar degenerative changes  only affecting the right S1 nerve roots in the lateral recess.    MR THORACIC SPINE IMPRESSION  04/20/2014 1. Normal thoracic spine and spinal cord, aside from lower thoracic  facet degeneration.  2. Abnormal signal in the upper lungs, see chest CT from earlier  Today.  CT CHEST, ABDOMEN, AND PELVIS WITH CONTRAST 04/20/2014 1. Bilateral upper lobe ground-glass opacities, concerning for  multifocal infection or possibly contusion given the possibility of  trauma.  2. No acute abnormality identified in the abdomen or pelvis.  3. Small bilateral hypodense renal lesions, possibly cysts but  indeterminate on this study. Consider further evaluation with  nonemergent abdominal MRI after the patient's acute illness has  resolved.   Medications:  Scheduled: . amitriptyline  100 mg Oral QHS  . Chlorhexidine Gluconate Cloth  6 each Topical Q0600  . clonazePAM  1 mg Oral QHS  . famotidine  20 mg Oral BID  . feeding supplement (GLUCERNA SHAKE)  237 mL Oral TID BM  . haloperidol  5 mg Oral TID  . heparin  5,000 Units Subcutaneous 3 times per day  . insulin aspart  0-20 Units Subcutaneous TID WC  . ketorolac  30 mg Intravenous 4 times per day  . mupirocin ointment  1 application Nasal BID  . QUEtiapine  100 mg Oral QHS  .  traMADol  100 mg Oral QID   Mikey Bussing PA-C [facility] Pager [phone] 04/25/2014, 8:28 AM  Patient seen and examined.  Clinical course and management discussed.  Necessary edits performed.  I agree with the above.  Assessment and plan of care developed and discussed below.     Assessment/Plan:   Patient now improving with both strength and sensation.  Biggest complaint at this point is pain.  Would expect further improvement as inflammation continues to improve.  Recommendations: 1.  Agree with current therapy. Will continue to follow with you.   Alexis Goodell, MD [facility] [phone]  04/25/2014  3:36 PM

## 2014-04-25 NOTE — Clinical Social Work Note (Signed)
Patient is being followed by CIR for possible admission. CSW will f/u with patient regarding SNF placement if declined by CIR.  [name], MSW, LCSW [phone]

## 2014-04-25 NOTE — Progress Notes (Addendum)
[facility] Progress Note   [name] [id number] DOB: 1976-03-20 DOA: 04/20/2014 PCP: Suzan Garibaldi, FNP  Brief narrative: [name] is a 38 y.o. female  smoker, with PMH of DM, HTN, pseudoseizures, cocaine and alcohol abuse, multiple admissions for substance abuse and involuntary commitment admitted on 6/28 after being found down in yard. Found to have elevated CK, glucose, lactic acid and CXR concerning for aspiration. UDS positive for cocaine. Patient unable to move lower extremities. Grandmother at bedside has told me the patient was evaluated at [facility] for 3 days for her h/o seizures and was told that she had pseudoseizures. She continues to have these pseudoseizures at home.  On the day she was admitted, she admits to smoking cocaine and drinking gin but cannot recall how much. She was found face down in her yard and does not recall how she got there. She was taking all of her pscy meds appropriately at home for the past 2 wks but was taking her Levemir irregularly   SIGNIFICANT EVENTS and STUDIES:  6/28 - Admit after being found down - elevated CK, glucose, & lactic acid. CXR concerning for aspiration. UDS positive for cocaine, pt with no memory of events.  6/28 CT Head>>>neg for ICH but has ethmoid and maxillary sinusitis  6/28 CT C-Spine>>>no cervical fx  6/26 CT Chest >>>bilateral upper GGO  6/28 CT ABD/Pelvis>>>no acute abnormality, small bilateral renal lesions (? Cysts)  6/28 - MRI T spine: normal  6/28 - MRI L spine- no stenosis, severe gluteal, pelvic and erector spinae mysositis (pulses in lowers are dopplered +)    Subjective: Got out of bed for the first time today. Pain was worse with movement. Otherwise doing well.   Assessment/Plan: Principal Problem:   Rhabdomyolysis - - CK has come down significantly from 44,127 on admission - hydration stopped due to underlying systolic CHF noted on ECHO here  Active Problems: Acute toxic  encephalopathy - pt was not sure of how she got out to the yard -CT head negative - suspect cocaine and alcohol intoxication  - resolved  Elevated LFTs - improving- likely related to rhabdo- no fatty liver on CT    Paraparesis of both lower limbs/ Myositis - due to being on ground??- she was found face down but her myositis is in gluteal area, erector spinae (left greater than right) and adductor muscles (right more than left) - d/c narcotics - started Toradol and Tramadol for pain- since they are working so well, will cont for now - will need to stop Toradol in 3 days- cont Pepcid  Left leg numbness-  - have asked for a neuro consult to evaluate- they also suspect compression of nerves in relation to above myositis-     HCAP (healthcare-associated pneumonia) and b/l Maxillary sinusiits ( on CT) - b/l upper lobe on CT and CXR - Zosyn started on 6/28 but stopped the following day by PCCM - is completely asymptomatic  Renal cysts? 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  Cocaine and alcohol abuse - states she will quit - will have case management assist with giving info on local services  DM - non- compliance with taking Levemir regularly (has no insurance but gets samples from PCP) - drinks [facility] constantly at home - eating much better- keep on current sliding scale and steadily increase Levemir - Aic 10  Bipolar - states she sleep a lot during the  day but she takes her psych meds as ordered - resume home meds- Clonazepam only QHS rather than TID, Haldol TID, Seroquel at bedtime- hold Depakote TID for now (note no Depakote level obtained on admission) - monitor for oversedation during the day - currently patient feels well with this regimen  Tachycardia - sinus tach - checking thyroid function- TSH high but free T4 pending - may be due to pain - ECHO shows LVH and diffuse hypokinesis- EF 45-50 % and LA is dilated-  will ask cardiology to evaluate  Chronic systolic CHF - see above per cardiology, this likely is ischemic in nature from chronic cocaine use - will need to add ACE when BP able to tolerate  Anorexia -resolved  Folliculitis on right lower leg - start Doxy and follow  Hyperlipidemia - needs low chol diet and control of sugars    Code Status: full code Family Communication: with grandmother, grand father and mother Disposition Plan: CIR  Consultants: Neuro PCCM  Procedures: none  Antibiotics: Anti-infectives   Start     Dose/Rate Route Frequency Ordered Stop   04/25/14 1400  doxycycline (VIBRA-TABS) tablet 100 mg     100 mg Oral Every 12 hours 04/25/14 1259     04/20/14 2200  piperacillin-tazobactam (ZOSYN) IVPB 3.375 g  Status:  Discontinued     3.375 g 12.5 mL/hr over 240 Minutes Intravenous 3 times per day 04/20/14 1813 04/21/14 1430   04/20/14 1100  vancomycin (VANCOCIN) IVPB 1000 mg/200 mL premix     1,000 mg 200 mL/hr over 60 Minutes Intravenous  Once 04/20/14 1053 04/20/14 1407   04/20/14 1100  piperacillin-tazobactam (ZOSYN) IVPB 3.375 g     3.375 g 12.5 mL/hr over 240 Minutes Intravenous  Once 04/20/14 1053 04/20/14 1353       DVT prophylaxis: Heparin  Objective: Filed Weights   04/22/14 2057 04/23/14 2252 04/24/14 2015  Weight: 108.092 kg (238 lb 4.8 oz) 111.54 kg (245 lb 14.4 oz) 112.6 kg (248 lb 3.8 oz)    Vitals Filed Vitals:   04/24/14 1717 04/24/14 2015 04/25/14 0516 04/25/14 1008  BP: 111/63 113/83 110/75 111/78  Pulse: 106 102 103 106  Temp: 98.4 F (36.9 C) 98.2 F (36.8 C) 97.9 F (36.6 C) 97.5 F (36.4 C)  TempSrc: Oral   Oral  Resp: 18 18 18 19   Height:      Weight:  112.6 kg (248 lb 3.8 oz)    SpO2: 100% 95% 95% 100%      Intake/Output Summary (Last 24 hours) at 04/25/14 1432 Last data filed at 04/25/14 1402  Gross per 24 hour  Intake    720 ml  Output   2900 ml  Net  -2180 ml     Exam: General: No acute  respiratory distress-smiling today Lungs: Clear to auscultation bilaterally without wheezes or crackles Cardiovascular: Regular rate and rhythm without murmur gallop or rub normal S1 and S2- tachycardic Abdomen: Nontender, nondistended, soft, bowel sounds positive- morbidly obese Extremities: No significant cyanosis, clubbing, or edema bilateral lower extremities-  MSK: lifting legs without problems now Neuro: poor sensation to touch below the knee on right leg-   Data Reviewed: Basic Metabolic Panel:  Recent Labs Lab 04/20/14 1850 04/21/14 0641 04/22/14 0555 04/23/14 0550 04/24/14 0450  NA 140  139 137 140 136* 137  K 4.6  4.3 3.5* 3.9 3.8 3.9  CL 101  99 101 106 100 102  CO2 21  21 22 19 22 23   GLUCOSE 260*  259* 135* 125* 167* 244*  BUN 8  8 7  4* 4* 7  CREATININE 0.60  0.55 0.51 0.43* 0.44* 0.47*  CALCIUM 7.4*  7.3* 7.5* 7.7* 8.1* 8.4  MG  --  1.9 1.8 1.6 1.6  PHOS  --  2.5 2.2* 3.3 4.2   Liver Function Tests:  Recent Labs Lab 04/20/14 1020 04/20/14 1850 04/21/14 0641 04/22/14 0555  AST 223* 299* 257* 160*  ALT 75* 107* 107* 86*  ALKPHOS 79 68 66 84  BILITOT 0.3 0.2* 0.2* 0.4  PROT 7.3 6.4 5.5* 5.3*  ALBUMIN 3.4* 2.7* 2.1* 1.7*   No results found for this basename: LIPASE, AMYLASE,  in the last 168 hours No results found for this basename: AMMONIA,  in the last 168 hours CBC:  Recent Labs Lab 04/20/14 1020 04/20/14 1034 04/20/14 1850 04/21/14 0641 04/22/14 0555  WBC 16.8*  --  10.5 11.1* 9.4  NEUTROABS 14.0*  --  8.4*  --  6.6  HGB 16.9* 18.0* 14.4 14.7 13.6  HCT 47.9* 53.0* 41.0 43.5 40.9  MCV 92.6  --  92.1 94.6 95.6  PLT 386  --  331 278 228   Cardiac Enzymes:  Recent Labs Lab 04/20/14 1020 04/20/14 1850 04/21/14 0031 04/21/14 0641 04/21/14 0913 04/23/14 0550 04/24/14 0450 04/25/14 0500  CKTOTAL 60454* 44127*  --  32031*  --  6057* 2293* 1208*  TROPONINI  --  0.85* 0.33* 0.59* 0.60*  --   --   --    BNP (last 3  results)  Recent Labs  04/20/14 1850 04/21/14 0641  PROBNP 4466.0* 4365.0*   CBG:  Recent Labs Lab 04/24/14 1206 04/24/14 1711 04/24/14 2013 04/25/14 0735 04/25/14 1204  GLUCAP 217* 204* 182* 276* 115*    Recent Results (from the past 240 hour(s))  CULTURE, BLOOD (ROUTINE X 2)     Status: None   Collection Time    04/20/14 10:54 AM      Result Value Ref Range Status   Specimen Description BLOOD LEFT WRIST   Final   Special Requests BOTTLES DRAWN AEROBIC AND ANAEROBIC 5CC   Final   Culture  Setup Time     Final   Value: 04/20/2014 22:49     Performed at [facility]   Culture     Final   Value:        BLOOD CULTURE RECEIVED NO GROWTH TO DATE CULTURE WILL BE HELD FOR 5 DAYS BEFORE ISSUING A FINAL NEGATIVE REPORT     Performed at [facility]   Report Status PENDING   Incomplete  CULTURE, BLOOD (ROUTINE X 2)     Status: None   Collection Time    04/20/14 10:59 AM      Result Value Ref Range Status   Specimen Description BLOOD RIGHT HAND   Final   Special Requests BOTTLES DRAWN AEROBIC ONLY 10CC   Final   Culture  Setup Time     Final   Value: 04/20/2014 22:49     Performed at [facility]   Culture     Final   Value:        BLOOD CULTURE RECEIVED NO GROWTH TO DATE CULTURE WILL BE HELD FOR 5 DAYS BEFORE ISSUING A FINAL NEGATIVE REPORT     Performed at [facility]   Report Status PENDING   Incomplete  MRSA PCR SCREENING     Status: Abnormal   Collection Time    04/20/14  6:01 PM  Result Value Ref Range Status   MRSA by PCR POSITIVE (*) NEGATIVE Final   Comment:            The GeneXpert MRSA Assay (FDA     approved for NASAL specimens     only), is one component of a     comprehensive MRSA colonization     surveillance program. It is not     intended to diagnose MRSA     infection nor to guide or     monitor treatment for     MRSA infections.     RESULT CALLED TO, READ BACK BY AND VERIFIED WITH:     ROYAL,K RN 2010 04/20/14  MITCHELL,L     Studies:  Recent x-ray studies have been reviewed in detail by the Attending Physician  Scheduled Meds:  Scheduled Meds: . amitriptyline  100 mg Oral QHS  . Chlorhexidine Gluconate Cloth  6 each Topical Q0600  . clonazePAM  1 mg Oral QHS  . doxycycline  100 mg Oral Q12H  . famotidine  20 mg Oral BID  . haloperidol  5 mg Oral TID  . heparin  5,000 Units Subcutaneous 3 times per day  . insulin aspart  0-20 Units Subcutaneous TID WC  . insulin detemir  20 Units Subcutaneous QHS  . ketorolac  30 mg Intravenous 4 times per day  . mupirocin ointment  1 application Nasal BID  . QUEtiapine  100 mg Oral QHS  . traMADol  100 mg Oral QID   Continuous Infusions:    Time spent on care of this patient: 35 min   [place], MD 04/25/2014, 2:32 PM  LOS: 5 days   [facility] Office  [phone] Pager - Text Page per Shea Evans   If 7PM-7AM, please contact night-coverage [email or web address]

## 2014-04-26 LAB — CK: Total CK: 817 U/L — ABNORMAL HIGH (ref 7–177)

## 2014-04-26 LAB — CULTURE, BLOOD (ROUTINE X 2)
Culture: NO GROWTH
Culture: NO GROWTH

## 2014-04-26 LAB — CBC
HCT: 35.3 % — ABNORMAL LOW (ref 36.0–46.0)
Hemoglobin: 11.9 g/dL — ABNORMAL LOW (ref 12.0–15.0)
MCH: 31.2 pg (ref 26.0–34.0)
MCHC: 33.7 g/dL (ref 30.0–36.0)
MCV: 92.7 fL (ref 78.0–100.0)
Platelets: 311 10*3/uL (ref 150–400)
RBC: 3.81 MIL/uL — ABNORMAL LOW (ref 3.87–5.11)
RDW: 12.7 % (ref 11.5–15.5)
WBC: 5.1 10*3/uL (ref 4.0–10.5)

## 2014-04-26 LAB — BASIC METABOLIC PANEL
Anion gap: 10 (ref 5–15)
BUN: 11 mg/dL (ref 6–23)
CO2: 27 mEq/L (ref 19–32)
Calcium: 8.7 mg/dL (ref 8.4–10.5)
Chloride: 102 mEq/L (ref 96–112)
Creatinine, Ser: 0.58 mg/dL (ref 0.50–1.10)
GFR calc Af Amer: >90 mL/min (ref >90)
GFR calc non Af Amer: >90 mL/min (ref >90)
Glucose, Bld: 166 mg/dL — ABNORMAL HIGH (ref 70–99)
Potassium: 4.3 mEq/L (ref 3.7–5.3)
Sodium: 139 mEq/L (ref 137–147)

## 2014-04-26 LAB — GLUCOSE, CAPILLARY
Glucose-Capillary: 117 mg/dL — ABNORMAL HIGH (ref 70–99)
Glucose-Capillary: 135 mg/dL — ABNORMAL HIGH (ref 70–99)
Glucose-Capillary: 263 mg/dL — ABNORMAL HIGH (ref 70–99)
Glucose-Capillary: 171 mg/dL — ABNORMAL HIGH (ref 70–99)

## 2014-04-26 MED ORDER — NAPROXEN 375 MG PO TABS
375.0000 mg | ORAL_TABLET | Freq: Two times a day (BID) | ORAL | Status: DC
  Administered 2014-04-26 – 2014-04-28 (×4): 375 mg via ORAL
  Filled 2014-04-26 (×7): qty 1

## 2014-04-26 MED ORDER — SODIUM CHLORIDE 0.9 % IV SOLN
3.0000 g | Freq: Four times a day (QID) | INTRAVENOUS | Status: DC
  Administered 2014-04-26 – 2014-04-28 (×9): 3 g via INTRAVENOUS
  Filled 2014-04-26 (×14): qty 3

## 2014-04-26 NOTE — Clinical Social Work Psychosocial (Addendum)
Clinical Social Work Department BRIEF PSYCHOSOCIAL ASSESSMENT 04/26/2014  Patient:  [name], [name]     Account Number:  [id number]     Admit date:  04/20/2014  Clinical Social Worker:  Hubert Azure  Date/Time:  04/26/2014 05:33 PM  Referred by:  Physician  Date Referred:  04/26/2014 Referred for  SNF Placement   Other Referral:   Interview type:  Patient Other interview type:    PSYCHOSOCIAL DATA Living Status:  ALONE Admitted from facility:   Level of care:   Primary support name:  Karna Dupes ([phone]) Primary support relationship to patient:  PARENT Degree of support available:   Good    CURRENT CONCERNS Current Concerns  Post-Acute Placement   Other Concerns:    SOCIAL WORK ASSESSMENT / PLAN CSW met with patient and mother who was present at bedside. Patient was alert and oriented x4. CSW introduced self and explained role. CSW discussed d/c plan with patient. Per patient, she lives alone next door to  her grandparents. Patient denied any assistance with ambulation and reported she was found down on the ground by her mother. Patient is agreeable to SNF as an alternative to CIR.   Assessment/plan status:  Information/Referral to [employer] Other assessment/ plan:   Information/referral to community resources:   CSW provided patient with community SNF list.    PATIENT'S/FAMILY'S RESPONSE TO PLAN OF CARE: Patient and mother thanked CSW for assistance with d/c plan.    [place], [place] Weekend Clinical Social Worker [phone]

## 2014-04-26 NOTE — Progress Notes (Signed)
[facility] Progress Note   [name] [id number] DOB: Aug 22, 1976 DOA: 04/20/2014 PCP: Suzan Garibaldi, FNP  Brief narrative: [name] is a 38 y.o. female  smoker, with PMH of DM, HTN, pseudoseizures, cocaine and alcohol abuse, multiple admissions for substance abuse and involuntary commitment admitted on 6/28 after being found down in yard. Found to have elevated CK, glucose, lactic acid and CXR concerning for aspiration. UDS positive for cocaine. Patient unable to move lower extremities. Grandmother at bedside has told me the patient was evaluated at [facility] for 3 days for her h/o seizures and was told that she had pseudoseizures. She continues to have these pseudoseizures at home.  On the day she was admitted, she admits to smoking cocaine and drinking gin but cannot recall how much. She was found face down in her yard and does not recall how she got there. She was taking all of her pscy meds appropriately at home for the past 2 wks but was taking her Levemir irregularly   SIGNIFICANT EVENTS and STUDIES:  6/28 - Admit after being found down - elevated CK, glucose, & lactic acid. CXR concerning for aspiration. UDS positive for cocaine, pt with no memory of events.  6/28 CT Head>>>neg for ICH but has ethmoid and maxillary sinusitis  6/28 CT C-Spine>>>no cervical fx  6/26 CT Chest >>>bilateral upper GGO  6/28 CT ABD/Pelvis>>>no acute abnormality, small bilateral renal lesions (? Cysts)  6/28 - MRI T spine: normal  6/28 - MRI L spine- no stenosis, severe gluteal, pelvic and erector spinae mysositis (pulses in lowers are dopplered +)    Subjective: Has some new spots on left leg. No other new complaints. Advised that I will be stopping the Toradol and starting Motrin today. Will stop Doxy and start Unasyn for folliculitis- pt in agreement.   Assessment/Plan: Principal Problem:   Rhabdomyolysis - - CK has come down significantly from 44,127 on admission - hydration  stopped due to underlying systolic CHF noted on ECHO here - pt drinking fluids quite well  Active Problems: Acute toxic encephalopathy - pt was not sure of how she got out to the yard -CT head negative - suspect cocaine and alcohol intoxication  - resolved  Folliculitis on right lower leg - started Doxy but now appearing on left leg- will switch to Unasyn  Elevated LFTs - improving- likely related to rhabdo- no fatty liver on CT    Paraparesis of both lower limbs/ Myositis - due to being on ground??- she was found face down but her myositis is in gluteal area, erector spinae (left greater than right) and adductor muscles (right more than left) - d/c narcotics - started Toradol and Tramadol for pain- since they are working so well, will cont for now - will need to stop Toradol in 3 days- cont Pepcid  Left leg numbness-  - have asked for a neuro consult to evaluate- they also suspect compression of nerves in relation to above myositis-     HCAP (healthcare-associated pneumonia) and b/l Maxillary sinusiits ( on CT) - b/l upper lobe on CT and CXR - Zosyn started on 6/28 but stopped the following day by PCCM - is completely asymptomatic  Renal cysts? 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  Cocaine and alcohol abuse - states she will quit - will have case management assist with giving info on local services  DM - non- compliance with taking Levemir  regularly (has no insurance but gets samples from PCP) - drinks [facility] constantly at home - eating much better- keep on current sliding scale and steadily increase Levemir - Aic 10  Bipolar - states she sleep a lot during the day but she takes her psych meds as ordered - resume home meds- Clonazepam only QHS rather than TID, Haldol TID, Seroquel at bedtime- hold Depakote TID for now (note no Depakote level obtained on admission) - monitor for oversedation during the day -  currently patient feels well with this regimen  Tachycardia - sinus tach - checking thyroid function- TSH high but free T4 pending - may be due to pain - ECHO shows LVH and diffuse hypokinesis- EF 45-50 % and LA is dilated- will ask cardiology to evaluate  Chronic systolic CHF - see above per cardiology, this likely is ischemic in nature from chronic cocaine use - will need to add ACE when BP able to tolerate  Anorexia -resolved  Hyperlipidemia - needs low chol diet and control of sugars    Code Status: full code Family Communication: with grandmother, grand father and mother Disposition Plan: CIR  Consultants: Neuro PCCM  Procedures: none  Antibiotics: Anti-infectives   Start     Dose/Rate Route Frequency Ordered Stop   04/26/14 1200  Ampicillin-Sulbactam (UNASYN) 3 g in sodium chloride 0.9 % 100 mL IVPB     3 g 100 mL/hr over 60 Minutes Intravenous Every 6 hours 04/26/14 1139     04/25/14 1400  doxycycline (VIBRA-TABS) tablet 100 mg  Status:  Discontinued     100 mg Oral Every 12 hours 04/25/14 1259 04/26/14 1139   04/20/14 2200  piperacillin-tazobactam (ZOSYN) IVPB 3.375 g  Status:  Discontinued     3.375 g 12.5 mL/hr over 240 Minutes Intravenous 3 times per day 04/20/14 1813 04/21/14 1430   04/20/14 1100  vancomycin (VANCOCIN) IVPB 1000 mg/200 mL premix     1,000 mg 200 mL/hr over 60 Minutes Intravenous  Once 04/20/14 1053 04/20/14 1407   04/20/14 1100  piperacillin-tazobactam (ZOSYN) IVPB 3.375 g     3.375 g 12.5 mL/hr over 240 Minutes Intravenous  Once 04/20/14 1053 04/20/14 1353       DVT prophylaxis: Heparin  Objective: Filed Weights   04/23/14 2252 04/24/14 2015 04/25/14 2145  Weight: 111.54 kg (245 lb 14.4 oz) 112.6 kg (248 lb 3.8 oz) 113.762 kg (250 lb 12.8 oz)    Vitals Filed Vitals:   04/25/14 1800 04/25/14 2145 04/26/14 0525 04/26/14 1001  BP: 113/78 119/83 106/76 119/84  Pulse: 110 110 100 111  Temp: 98 F (36.7 C) 97.6 F (36.4 C)  98.3 F (36.8 C) 97.7 F (36.5 C)  TempSrc: Oral Oral Oral Oral  Resp: 18 17 18 18   Height:      Weight:  113.762 kg (250 lb 12.8 oz)    SpO2: 99% 100% 98% 100%      Intake/Output Summary (Last 24 hours) at 04/26/14 1212 Last data filed at 04/26/14 1004  Gross per 24 hour  Intake    360 ml  Output   1775 ml  Net  -1415 ml     Exam: General: No acute respiratory distress-smiling today Lungs: Clear to auscultation bilaterally without wheezes or crackles Cardiovascular: Regular rate and rhythm without murmur gallop or rub normal S1 and S2- tachycardic Abdomen: Nontender, nondistended, soft, bowel sounds positive- morbidly obese Extremities: No significant cyanosis, clubbing, or edema bilateral lower extremities-  MSK: lifting legs without problems now  Neuro: poor sensation to touch below the knee on right leg-   Data Reviewed: Basic Metabolic Panel:  Recent Labs Lab 04/20/14 1850 04/21/14 0641 04/22/14 0555 04/23/14 0550 04/24/14 0450 04/26/14 0454  NA 140  139 137 140 136* 137 139  K 4.6  4.3 3.5* 3.9 3.8 3.9 4.3  CL 101  99 101 106 100 102 102  CO2 21  21 22 19 22 23 27   GLUCOSE 260*  259* 135* 125* 167* 244* 166*  BUN 8  8 7  4* 4* 7 11  CREATININE 0.60  0.55 0.51 0.43* 0.44* 0.47* 0.58  CALCIUM 7.4*  7.3* 7.5* 7.7* 8.1* 8.4 8.7  MG  --  1.9 1.8 1.6 1.6  --   PHOS  --  2.5 2.2* 3.3 4.2  --    Liver Function Tests:  Recent Labs Lab 04/20/14 1020 04/20/14 1850 04/21/14 0641 04/22/14 0555  AST 223* 299* 257* 160*  ALT 75* 107* 107* 86*  ALKPHOS 79 68 66 84  BILITOT 0.3 0.2* 0.2* 0.4  PROT 7.3 6.4 5.5* 5.3*  ALBUMIN 3.4* 2.7* 2.1* 1.7*   No results found for this basename: LIPASE, AMYLASE,  in the last 168 hours No results found for this basename: AMMONIA,  in the last 168 hours CBC:  Recent Labs Lab 04/20/14 1020 04/20/14 1034 04/20/14 1850 04/21/14 0641 04/22/14 0555 04/26/14 0454  WBC 16.8*  --  10.5 11.1* 9.4 5.1  NEUTROABS 14.0*   --  8.4*  --  6.6  --   HGB 16.9* 18.0* 14.4 14.7 13.6 11.9*  HCT 47.9* 53.0* 41.0 43.5 40.9 35.3*  MCV 92.6  --  92.1 94.6 95.6 92.7  PLT 386  --  331 278 228 311   Cardiac Enzymes:  Recent Labs Lab 04/20/14 1020 04/20/14 1850 04/21/14 0031 04/21/14 0641 04/21/14 0913 04/23/14 0550 04/24/14 0450 04/25/14 0500 04/26/14 0454  CKTOTAL 16109* [id number]*  --  32031*  --  6057* 2293* 1208* 817*  TROPONINI  --  0.85* 0.33* 0.59* 0.60*  --   --   --   --    BNP (last 3 results)  Recent Labs  04/20/14 1850 04/21/14 0641  PROBNP 4466.0* 4365.0*   CBG:  Recent Labs Lab 04/25/14 0735 04/25/14 1204 04/25/14 1707 04/25/14 2144 04/26/14 0742  GLUCAP 276* 115* 223* 225* 117*    Recent Results (from the past 240 hour(s))  CULTURE, BLOOD (ROUTINE X 2)     Status: None   Collection Time    04/20/14 10:54 AM      Result Value Ref Range Status   Specimen Description BLOOD LEFT WRIST   Final   Special Requests BOTTLES DRAWN AEROBIC AND ANAEROBIC 5CC   Final   Culture  Setup Time     Final   Value: 04/20/2014 22:49     Performed at [facility]   Culture     Final   Value:        BLOOD CULTURE RECEIVED NO GROWTH TO DATE CULTURE WILL BE HELD FOR 5 DAYS BEFORE ISSUING A FINAL NEGATIVE REPORT     Performed at [facility]   Report Status PENDING   Incomplete  CULTURE, BLOOD (ROUTINE X 2)     Status: None   Collection Time    04/20/14 10:59 AM      Result Value Ref Range Status   Specimen Description BLOOD RIGHT HAND   Final   Special Requests BOTTLES DRAWN AEROBIC ONLY 10CC  Final   Culture  Setup Time     Final   Value: 04/20/2014 22:49     Performed at [facility]   Culture     Final   Value:        BLOOD CULTURE RECEIVED NO GROWTH TO DATE CULTURE WILL BE HELD FOR 5 DAYS BEFORE ISSUING A FINAL NEGATIVE REPORT     Performed at [facility]   Report Status PENDING   Incomplete  MRSA PCR SCREENING     Status: Abnormal   Collection Time     04/20/14  6:01 PM      Result Value Ref Range Status   MRSA by PCR POSITIVE (*) NEGATIVE Final   Comment:            The GeneXpert MRSA Assay (FDA     approved for NASAL specimens     only), is one component of a     comprehensive MRSA colonization     surveillance program. It is not     intended to diagnose MRSA     infection nor to guide or     monitor treatment for     MRSA infections.     RESULT CALLED TO, READ BACK BY AND VERIFIED WITH:     ROYAL,K RN 2010 04/20/14 MITCHELL,L     Studies:  Recent x-ray studies have been reviewed in detail by the Attending Physician  Scheduled Meds:  Scheduled Meds: . amitriptyline  100 mg Oral QHS  . ampicillin-sulbactam (UNASYN) IV  3 g Intravenous Q6H  . Chlorhexidine Gluconate Cloth  6 each Topical Q0600  . clonazePAM  1 mg Oral QHS  . famotidine  20 mg Oral BID  . haloperidol  5 mg Oral TID  . heparin  5,000 Units Subcutaneous 3 times per day  . insulin aspart  0-20 Units Subcutaneous TID WC  . insulin detemir  20 Units Subcutaneous QHS  . ketorolac  30 mg Intravenous 4 times per day  . mupirocin ointment  1 application Nasal BID  . naproxen  375 mg Oral BID WC  . QUEtiapine  100 mg Oral QHS  . traMADol  100 mg Oral QID   Continuous Infusions:    Time spent on care of this patient: 35 min   [place], MD 04/26/2014, 12:12 PM  LOS: 6 days   [facility] Office  [phone] Pager - Text Page per Shea Evans   If 7PM-7AM, please contact night-coverage [email or web address]

## 2014-04-27 LAB — CK: Total CK: 606 U/L — ABNORMAL HIGH (ref 7–177)

## 2014-04-27 LAB — GLUCOSE, CAPILLARY
Glucose-Capillary: 128 mg/dL — ABNORMAL HIGH (ref 70–99)
Glucose-Capillary: 159 mg/dL — ABNORMAL HIGH (ref 70–99)
Glucose-Capillary: 227 mg/dL — ABNORMAL HIGH (ref 70–99)
Glucose-Capillary: 203 mg/dL — ABNORMAL HIGH (ref 70–99)

## 2014-04-27 MED ORDER — GABAPENTIN 300 MG PO CAPS
300.0000 mg | ORAL_CAPSULE | Freq: Three times a day (TID) | ORAL | Status: DC
  Administered 2014-04-27 – 2014-04-28 (×5): 300 mg via ORAL
  Filled 2014-04-27 (×6): qty 1

## 2014-04-27 MED ORDER — GABAPENTIN 600 MG PO TABS
300.0000 mg | ORAL_TABLET | Freq: Three times a day (TID) | ORAL | Status: DC
  Filled 2014-04-27 (×3): qty 0.5

## 2014-04-27 NOTE — Progress Notes (Signed)
[facility] Progress Note   [name] [id number] DOB: [birth year] DOA: 04/20/2014 PCP: Suzan Garibaldi, FNP  Brief narrative: [name] is a 38 y.o. female  smoker, with PMH of DM, HTN, pseudoseizures, cocaine and alcohol abuse, multiple admissions for substance abuse and involuntary commitment admitted on 6/28 after being found down in yard. Found to have elevated CK, glucose, lactic acid and CXR concerning for aspiration. UDS positive for cocaine. Patient unable to move lower extremities. Grandmother at bedside has told me the patient was evaluated at [facility] for 3 days for her h/o seizures and was told that she had pseudoseizures. She continues to have these pseudoseizures at home.  On the day she was admitted, she admits to smoking cocaine and drinking gin but cannot recall how much. She was found face down in her yard and does not recall how she got there. She was taking all of her pscy meds appropriately at home for the past 2 wks but was taking her Levemir irregularly   SIGNIFICANT EVENTS and STUDIES:  6/28 - Admit after being found down - elevated CK, glucose, & lactic acid. CXR concerning for aspiration. UDS positive for cocaine, pt with no memory of events.  6/28 CT Head>>>neg for ICH but has ethmoid and maxillary sinusitis  6/28 CT C-Spine>>>no cervical fx  6/26 CT Chest >>>bilateral upper GGO  6/28 CT ABD/Pelvis>>>no acute abnormality, small bilateral renal lesions (? Cysts)  6/28 - MRI T spine: normal  6/28 - MRI L spine- no stenosis, severe gluteal, pelvic and erector spinae mysositis (pulses in lowers are dopplered +)    Subjective: Has some new spots on left leg. No other new complaints. Advised that I will be stopping the Toradol and starting Motrin today. Will stop Doxy and start Unasyn for folliculitis- pt in agreement.   Assessment/Plan: Principal Problem:   Rhabdomyolysis - - CK has come down significantly from 44,127 on admission - hydration  stopped due to underlying systolic CHF noted on ECHO here - pt drinking fluids quite well  Active Problems: Acute toxic encephalopathy - pt was not sure of how she got out to the yard -CT head negative - suspect cocaine and alcohol intoxication  - resolved  Folliculitis on right lower leg - started Doxy but began to  appear on left leg- switched to Unasyn  Elevated LFTs - improving- likely related to rhabdo- no fatty liver on CT    Paraparesis of both lower limbs/ Myositis - due to being on ground??- she was found face down but her myositis is in gluteal area, erector spinae (left greater than right) and adductor muscles (right more than left) - d/c narcotics - started Toradol and Tramadol for pain- - stopped Toradol now and change to Motrin  Left leg numbness-  - have asked for a neuro consult to evaluate- they also suspect compression of nerves in relation to above myositis- sensation is returning   Peripheral neuropathy - feet burning now- chronic issue and has used Neurontin in the past- will start Neurontin- follow for sedation    HCAP (healthcare-associated pneumonia) and b/l Maxillary sinusiits ( on CT) - b/l upper lobe on CT and CXR - Zosyn started on 6/28 but stopped the following day by PCCM - is completely asymptomatic  Renal cysts? 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  Cocaine and alcohol abuse - states she will quit - will have case management assist with  giving info on local services  DM - non- compliance with taking Levemir regularly (has no insurance but gets samples from PCP) - drinks [facility] constantly at home - eating much better- keep on current sliding scale and steadily increase Levemir - Aic 10  Bipolar - states she sleep a lot during the day but she takes her psych meds as ordered - resume home meds- Clonazepam only QHS rather than TID, Haldol TID, Seroquel at bedtime- hold Depakote TID  for now (note no Depakote level obtained on admission) - monitor for oversedation during the day - currently patient feels well with this regimen  Tachycardia - sinus tach - checking thyroid function- TSH high but free T4 normal - may be due to pain and deconditioning - ECHO shows LVH and diffuse hypokinesis- EF 45-50 % and LA is dilated- will ask cardiology to evaluate  Chronic systolic CHF - see above per cardiology, this likely is ischemic in nature from chronic cocaine use - will need to add ACE when BP able to tolerate  Anorexia -resolved  Hyperlipidemia - needs low chol diet and control of sugars    Code Status: full code Family Communication: with grandmother, grand father and mother Disposition Plan: CIR  Consultants: Neuro PCCM  Procedures: none  Antibiotics: Anti-infectives   Start     Dose/Rate Route Frequency Ordered Stop   04/26/14 1200  Ampicillin-Sulbactam (UNASYN) 3 g in sodium chloride 0.9 % 100 mL IVPB     3 g 100 mL/hr over 60 Minutes Intravenous Every 6 hours 04/26/14 1139     04/25/14 1400  doxycycline (VIBRA-TABS) tablet 100 mg  Status:  Discontinued     100 mg Oral Every 12 hours 04/25/14 1259 04/26/14 1139   04/20/14 2200  piperacillin-tazobactam (ZOSYN) IVPB 3.375 g  Status:  Discontinued     3.375 g 12.5 mL/hr over 240 Minutes Intravenous 3 times per day 04/20/14 1813 04/21/14 1430   04/20/14 1100  vancomycin (VANCOCIN) IVPB 1000 mg/200 mL premix     1,000 mg 200 mL/hr over 60 Minutes Intravenous  Once 04/20/14 1053 04/20/14 1407   04/20/14 1100  piperacillin-tazobactam (ZOSYN) IVPB 3.375 g     3.375 g 12.5 mL/hr over 240 Minutes Intravenous  Once 04/20/14 1053 04/20/14 1353       DVT prophylaxis: Heparin  Objective: Filed Weights   04/24/14 2015 04/25/14 2145 04/27/14 0417  Weight: 112.6 kg (248 lb 3.8 oz) 113.762 kg (250 lb 12.8 oz) 113.853 kg (251 lb)    Vitals Filed Vitals:   04/26/14 1001 04/26/14 1732 04/26/14 2002  04/27/14 0417  BP: 119/84 123/88 129/86 99/70  Pulse: 111 107 109 115  Temp: 97.7 F (36.5 C) 97.7 F (36.5 C) 98.2 F (36.8 C) 98.3 F (36.8 C)  TempSrc: Oral Oral Oral Oral  Resp: 18 17 18 16   Height:      Weight:    113.853 kg (251 lb)  SpO2: 100% 98% 92% 96%      Intake/Output Summary (Last 24 hours) at 04/27/14 1240 Last data filed at 04/26/14 2017  Gross per 24 hour  Intake    120 ml  Output      0 ml  Net    120 ml     Exam: General: No acute respiratory distress-smiling today Lungs: Clear to auscultation bilaterally without wheezes or crackles Cardiovascular: Regular rate and rhythm without murmur gallop or rub normal S1 and S2- tachycardic Abdomen: Nontender, nondistended, soft, bowel sounds positive- morbidly obese Extremities:  No significant cyanosis, clubbing, or edema bilateral lower extremities-  MSK: lifting legs without problems now Neuro: poor sensation to touch below the knee on right leg-   Data Reviewed: Basic Metabolic Panel:  Recent Labs Lab 04/20/14 1850 04/21/14 0641 04/22/14 0555 04/23/14 0550 04/24/14 0450 04/26/14 0454  NA 140  139 137 140 136* 137 139  K 4.6  4.3 3.5* 3.9 3.8 3.9 4.3  CL 101  99 101 106 100 102 102  CO2 21  21 22 19 22 23 27   GLUCOSE 260*  259* 135* 125* 167* 244* 166*  BUN 8  8 7  4* 4* 7 11  CREATININE 0.60  0.55 0.51 0.43* 0.44* 0.47* 0.58  CALCIUM 7.4*  7.3* 7.5* 7.7* 8.1* 8.4 8.7  MG  --  1.9 1.8 1.6 1.6  --   PHOS  --  2.5 2.2* 3.3 4.2  --    Liver Function Tests:  Recent Labs Lab 04/20/14 1850 04/21/14 0641 04/22/14 0555  AST 299* 257* 160*  ALT 107* 107* 86*  ALKPHOS 68 66 84  BILITOT 0.2* 0.2* 0.4  PROT 6.4 5.5* 5.3*  ALBUMIN 2.7* 2.1* 1.7*   No results found for this basename: LIPASE, AMYLASE,  in the last 168 hours No results found for this basename: AMMONIA,  in the last 168 hours CBC:  Recent Labs Lab 04/20/14 1850 04/21/14 0641 04/22/14 0555 04/26/14 0454  WBC 10.5 11.1*  9.4 5.1  NEUTROABS 8.4*  --  6.6  --   HGB 14.4 14.7 13.6 11.9*  HCT 41.0 43.5 40.9 35.3*  MCV 92.1 94.6 95.6 92.7  PLT 331 278 228 311   Cardiac Enzymes:  Recent Labs Lab 04/20/14 1850 04/21/14 0031 04/21/14 0641 04/21/14 0913 04/23/14 0550 04/24/14 0450 04/25/14 0500 04/26/14 0454 04/27/14 0400  CKTOTAL 29562*  --  32031*  --  6057* 2293* 1208* 817* 606*  TROPONINI 0.85* 0.33* 0.59* 0.60*  --   --   --   --   --    BNP (last 3 results)  Recent Labs  04/20/14 1850 04/21/14 0641  PROBNP 4466.0* 4365.0*   CBG:  Recent Labs Lab 04/26/14 1231 04/26/14 1732 04/26/14 2000 04/27/14 0823 04/27/14 1139  GLUCAP 135* 263* 171* 128* 159*    Recent Results (from the past 240 hour(s))  CULTURE, BLOOD (ROUTINE X 2)     Status: None   Collection Time    04/20/14 10:54 AM      Result Value Ref Range Status   Specimen Description BLOOD LEFT WRIST   Final   Special Requests BOTTLES DRAWN AEROBIC AND ANAEROBIC 5CC   Final   Culture  Setup Time     Final   Value: 04/20/2014 22:49     Performed at [facility]   Culture     Final   Value: NO GROWTH 5 DAYS     Performed at [facility]   Report Status 04/26/2014 FINAL   Final  CULTURE, BLOOD (ROUTINE X 2)     Status: None   Collection Time    04/20/14 10:59 AM      Result Value Ref Range Status   Specimen Description BLOOD RIGHT HAND   Final   Special Requests BOTTLES DRAWN AEROBIC ONLY 10CC   Final   Culture  Setup Time     Final   Value: 04/20/2014 22:49     Performed at [facility]   Culture     Final   Value: NO  GROWTH 5 DAYS     Performed at [facility]   Report Status 04/26/2014 FINAL   Final  MRSA PCR SCREENING     Status: Abnormal   Collection Time    04/20/14  6:01 PM      Result Value Ref Range Status   MRSA by PCR POSITIVE (*) NEGATIVE Final   Comment:            The GeneXpert MRSA Assay (FDA     approved for NASAL specimens     only), is one component of a      comprehensive MRSA colonization     surveillance program. It is not     intended to diagnose MRSA     infection nor to guide or     monitor treatment for     MRSA infections.     RESULT CALLED TO, READ BACK BY AND VERIFIED WITH:     ROYAL,K RN 2010 04/20/14 MITCHELL,L     Studies:  Recent x-ray studies have been reviewed in detail by the Attending Physician  Scheduled Meds:  Scheduled Meds: . amitriptyline  100 mg Oral QHS  . ampicillin-sulbactam (UNASYN) IV  3 g Intravenous Q6H  . clonazePAM  1 mg Oral QHS  . famotidine  20 mg Oral BID  . gabapentin  300 mg Oral TID  . haloperidol  5 mg Oral TID  . heparin  5,000 Units Subcutaneous 3 times per day  . insulin aspart  0-20 Units Subcutaneous TID WC  . insulin detemir  20 Units Subcutaneous QHS  . mupirocin ointment  1 application Nasal BID  . naproxen  375 mg Oral BID WC  . QUEtiapine  100 mg Oral QHS  . traMADol  100 mg Oral QID   Continuous Infusions:    Time spent on care of this patient: 35 min   [place], MD 04/27/2014, 12:40 PM  LOS: 7 days   [facility] Office  [phone] Pager - Text Page per Shea Evans   If 7PM-7AM, please contact night-coverage [email or web address]

## 2014-04-28 ENCOUNTER — Encounter (HOSPITAL_COMMUNITY): Payer: Self-pay | Admitting: Physical Medicine and Rehabilitation

## 2014-04-28 ENCOUNTER — Inpatient Hospital Stay (HOSPITAL_COMMUNITY)
Admission: RE | Admit: 2014-04-28 | Discharge: 2014-05-14 | DRG: 945 | Disposition: A | Discharge status: Home Health | Payer: Medicaid Other | Source: Intra-hospital | Attending: Physical Medicine & Rehabilitation | Admitting: Physical Medicine & Rehabilitation

## 2014-04-28 DIAGNOSIS — L678 Other hair color and hair shaft abnormalities: Secondary | ICD-10-CM | POA: Diagnosis not present

## 2014-04-28 DIAGNOSIS — Q619 Cystic kidney disease, unspecified: Secondary | ICD-10-CM | POA: Diagnosis not present

## 2014-04-28 DIAGNOSIS — R569 Unspecified convulsions: Secondary | ICD-10-CM

## 2014-04-28 DIAGNOSIS — E1142 Type 2 diabetes mellitus with diabetic polyneuropathy: Secondary | ICD-10-CM | POA: Diagnosis not present

## 2014-04-28 DIAGNOSIS — F17200 Nicotine dependence, unspecified, uncomplicated: Secondary | ICD-10-CM | POA: Diagnosis not present

## 2014-04-28 DIAGNOSIS — T796XXA Traumatic ischemia of muscle, initial encounter: Secondary | ICD-10-CM

## 2014-04-28 DIAGNOSIS — R32 Unspecified urinary incontinence: Secondary | ICD-10-CM | POA: Diagnosis not present

## 2014-04-28 DIAGNOSIS — M609 Myositis, unspecified: Secondary | ICD-10-CM

## 2014-04-28 DIAGNOSIS — I10 Essential (primary) hypertension: Secondary | ICD-10-CM

## 2014-04-28 DIAGNOSIS — F1410 Cocaine abuse, uncomplicated: Secondary | ICD-10-CM | POA: Diagnosis not present

## 2014-04-28 DIAGNOSIS — N289 Disorder of kidney and ureter, unspecified: Secondary | ICD-10-CM | POA: Diagnosis not present

## 2014-04-28 DIAGNOSIS — L738 Other specified follicular disorders: Secondary | ICD-10-CM | POA: Diagnosis not present

## 2014-04-28 DIAGNOSIS — G8220 Paraplegia, unspecified: Secondary | ICD-10-CM

## 2014-04-28 DIAGNOSIS — Z794 Long term (current) use of insulin: Secondary | ICD-10-CM

## 2014-04-28 DIAGNOSIS — F319 Bipolar disorder, unspecified: Secondary | ICD-10-CM

## 2014-04-28 DIAGNOSIS — G92 Toxic encephalopathy: Secondary | ICD-10-CM

## 2014-04-28 DIAGNOSIS — R339 Retention of urine, unspecified: Secondary | ICD-10-CM

## 2014-04-28 DIAGNOSIS — E1169 Type 2 diabetes mellitus with other specified complication: Secondary | ICD-10-CM

## 2014-04-28 DIAGNOSIS — E119 Type 2 diabetes mellitus without complications: Secondary | ICD-10-CM

## 2014-04-28 DIAGNOSIS — E118 Type 2 diabetes mellitus with unspecified complications: Secondary | ICD-10-CM | POA: Diagnosis present

## 2014-04-28 DIAGNOSIS — F445 Conversion disorder with seizures or convulsions: Secondary | ICD-10-CM

## 2014-04-28 DIAGNOSIS — F1910 Other psychoactive substance abuse, uncomplicated: Secondary | ICD-10-CM

## 2014-04-28 DIAGNOSIS — E1149 Type 2 diabetes mellitus with other diabetic neurological complication: Secondary | ICD-10-CM | POA: Diagnosis not present

## 2014-04-28 DIAGNOSIS — Z79899 Other long term (current) drug therapy: Secondary | ICD-10-CM

## 2014-04-28 DIAGNOSIS — T796XXS Traumatic ischemia of muscle, sequela: Secondary | ICD-10-CM

## 2014-04-28 DIAGNOSIS — G729 Myopathy, unspecified: Secondary | ICD-10-CM

## 2014-04-28 DIAGNOSIS — R609 Edema, unspecified: Secondary | ICD-10-CM | POA: Diagnosis not present

## 2014-04-28 DIAGNOSIS — G929 Unspecified toxic encephalopathy: Secondary | ICD-10-CM

## 2014-04-28 DIAGNOSIS — Z5189 Encounter for other specified aftercare: Secondary | ICD-10-CM | POA: Diagnosis present

## 2014-04-28 DIAGNOSIS — M6282 Rhabdomyolysis: Secondary | ICD-10-CM | POA: Diagnosis not present

## 2014-04-28 DIAGNOSIS — N2889 Other specified disorders of kidney and ureter: Secondary | ICD-10-CM | POA: Diagnosis present

## 2014-04-28 LAB — GLUCOSE, CAPILLARY
Glucose-Capillary: 182 mg/dL — ABNORMAL HIGH (ref 70–99)
Glucose-Capillary: 223 mg/dL — ABNORMAL HIGH (ref 70–99)
Glucose-Capillary: 256 mg/dL — ABNORMAL HIGH (ref 70–99)
Glucose-Capillary: 208 mg/dL — ABNORMAL HIGH (ref 70–99)

## 2014-04-28 LAB — CK: Total CK: 490 U/L — ABNORMAL HIGH (ref 7–177)

## 2014-04-28 MED ORDER — FAMOTIDINE 20 MG PO TABS
20.0000 mg | ORAL_TABLET | Freq: Two times a day (BID) | ORAL | Status: DC
  Administered 2014-04-28 – 2014-05-14 (×32): 20 mg via ORAL
  Filled 2014-04-28 (×34): qty 1

## 2014-04-28 MED ORDER — LIDOCAINE HCL 2 % EX GEL: CUTANEOUS | Status: DC | PRN

## 2014-04-28 MED ORDER — TRAMADOL HCL 50 MG PO TABS
100.0000 mg | ORAL_TABLET | Freq: Four times a day (QID) | ORAL | Status: DC
Start: 2014-04-28 — End: 2014-05-14

## 2014-04-28 MED ORDER — MUPIROCIN 2 % EX OINT
1.0000 "application " | TOPICAL_OINTMENT | Freq: Two times a day (BID) | CUTANEOUS | Status: DC
  Administered 2014-04-28 – 2014-04-30 (×5): 1 via NASAL
  Filled 2014-04-28: qty 22

## 2014-04-28 MED ORDER — INSULIN ASPART 100 UNIT/ML ~~LOC~~ SOLN: 0.0000 [IU] | Freq: Three times a day (TID) | SUBCUTANEOUS | Status: DC

## 2014-04-28 MED ORDER — PROCHLORPERAZINE 25 MG RE SUPP
12.5000 mg | Freq: Four times a day (QID) | RECTAL | Status: DC | PRN
  Filled 2014-04-28: qty 1

## 2014-04-28 MED ORDER — INSULIN DETEMIR 100 UNIT/ML ~~LOC~~ SOLN: 20.0000 [IU] | Freq: Every day | SUBCUTANEOUS | Status: DC

## 2014-04-28 MED ORDER — SENNOSIDES-DOCUSATE SODIUM 8.6-50 MG PO TABS
2.0000 | ORAL_TABLET | Freq: Every evening | ORAL | Status: DC | PRN
Start: 2014-04-28 — End: 2014-04-30

## 2014-04-28 MED ORDER — QUETIAPINE FUMARATE 100 MG PO TABS
100.0000 mg | ORAL_TABLET | Freq: Every day | ORAL | Status: DC
  Administered 2014-04-28 – 2014-05-13 (×16): 100 mg via ORAL
  Filled 2014-04-28 (×17): qty 1

## 2014-04-28 MED ORDER — ENOXAPARIN SODIUM 40 MG/0.4ML ~~LOC~~ SOLN
40.0000 mg | Freq: Every day | SUBCUTANEOUS | Status: DC
  Administered 2014-04-29 – 2014-05-02 (×4): 40 mg via SUBCUTANEOUS
  Filled 2014-04-28 (×6): qty 0.4

## 2014-04-28 MED ORDER — GUAIFENESIN-DM 100-10 MG/5ML PO SYRP: 5.0000 mL | ORAL_SOLUTION | Freq: Four times a day (QID) | ORAL | Status: DC | PRN

## 2014-04-28 MED ORDER — FLEET ENEMA 7-19 GM/118ML RE ENEM: 1.0000 | ENEMA | Freq: Once | RECTAL | Status: AC | PRN

## 2014-04-28 MED ORDER — DIPHENHYDRAMINE HCL 12.5 MG/5ML PO ELIX: 12.5000 mg | ORAL_SOLUTION | Freq: Four times a day (QID) | ORAL | Status: DC | PRN

## 2014-04-28 MED ORDER — CEPHALEXIN 500 MG PO CAPS
500.0000 mg | ORAL_CAPSULE | Freq: Three times a day (TID) | ORAL | Status: AC
  Administered 2014-04-28 – 2014-05-03 (×15): 500 mg via ORAL
  Filled 2014-04-28 (×15): qty 1

## 2014-04-28 MED ORDER — CLONAZEPAM 0.5 MG PO TABS
1.0000 mg | ORAL_TABLET | Freq: Every day | ORAL | Status: DC
  Administered 2014-04-28 – 2014-05-06 (×9): 1 mg via ORAL
  Filled 2014-04-28 (×9): qty 2

## 2014-04-28 MED ORDER — AMITRIPTYLINE HCL 100 MG PO TABS
100.0000 mg | ORAL_TABLET | Freq: Every day | ORAL | Status: DC
  Administered 2014-04-28 – 2014-05-02 (×5): 100 mg via ORAL
  Filled 2014-04-28 (×6): qty 1

## 2014-04-28 MED ORDER — NAPROXEN 375 MG PO TABS: 375.0000 mg | ORAL_TABLET | Freq: Two times a day (BID) | ORAL | Status: DC

## 2014-04-28 MED ORDER — INSULIN DETEMIR 100 UNIT/ML ~~LOC~~ SOLN
20.0000 [IU] | Freq: Two times a day (BID) | SUBCUTANEOUS | Status: DC
  Administered 2014-04-28 – 2014-05-01 (×6): 20 [IU] via SUBCUTANEOUS
  Filled 2014-04-28 (×7): qty 0.2

## 2014-04-28 MED ORDER — GABAPENTIN 300 MG PO CAPS
300.0000 mg | ORAL_CAPSULE | Freq: Three times a day (TID) | ORAL | Status: DC
  Administered 2014-04-28 – 2014-05-14 (×47): 300 mg via ORAL
  Filled 2014-04-28 (×50): qty 1

## 2014-04-28 MED ORDER — ACETAMINOPHEN 325 MG PO TABS
325.0000 mg | ORAL_TABLET | ORAL | Status: DC | PRN
  Administered 2014-05-02 – 2014-05-13 (×7): 650 mg via ORAL
  Filled 2014-04-28 (×7): qty 2

## 2014-04-28 MED ORDER — AMOXICILLIN-POT CLAVULANATE 875-125 MG PO TABS: 1.0000 | ORAL_TABLET | Freq: Two times a day (BID) | ORAL | Status: AC

## 2014-04-28 MED ORDER — INSULIN ASPART 100 UNIT/ML ~~LOC~~ SOLN
0.0000 [IU] | Freq: Every day | SUBCUTANEOUS | Status: DC
  Administered 2014-04-28 – 2014-05-07 (×3): 2 [IU] via SUBCUTANEOUS

## 2014-04-28 MED ORDER — INSULIN ASPART 100 UNIT/ML ~~LOC~~ SOLN
0.0000 [IU] | Freq: Three times a day (TID) | SUBCUTANEOUS | Status: DC
  Administered 2014-04-28: 5 [IU] via SUBCUTANEOUS
  Administered 2014-04-29 (×3): 3 [IU] via SUBCUTANEOUS
  Administered 2014-04-30: 5 [IU] via SUBCUTANEOUS
  Administered 2014-04-30: 1 [IU] via SUBCUTANEOUS
  Administered 2014-04-30: 2 [IU] via SUBCUTANEOUS
  Administered 2014-05-01: 3 [IU] via SUBCUTANEOUS
  Administered 2014-05-01: 1 [IU] via SUBCUTANEOUS
  Administered 2014-05-01: 2 [IU] via SUBCUTANEOUS
  Administered 2014-05-02 (×2): 3 [IU] via SUBCUTANEOUS
  Administered 2014-05-03 (×2): 2 [IU] via SUBCUTANEOUS
  Administered 2014-05-05: 1 [IU] via SUBCUTANEOUS
  Administered 2014-05-06: 2 [IU] via SUBCUTANEOUS
  Administered 2014-05-06: 1 [IU] via SUBCUTANEOUS
  Administered 2014-05-07 (×2): 2 [IU] via SUBCUTANEOUS
  Administered 2014-05-08 – 2014-05-12 (×6): 1 [IU] via SUBCUTANEOUS
  Administered 2014-05-12: 3 [IU] via SUBCUTANEOUS
  Administered 2014-05-13: 2 [IU] via SUBCUTANEOUS
  Administered 2014-05-14: 1 [IU] via SUBCUTANEOUS

## 2014-04-28 MED ORDER — GABAPENTIN 300 MG PO CAPS: 300.0000 mg | ORAL_CAPSULE | Freq: Three times a day (TID) | ORAL | Status: DC

## 2014-04-28 MED ORDER — BISACODYL 10 MG RE SUPP
10.0000 mg | Freq: Every day | RECTAL | Status: DC | PRN
  Administered 2014-05-02: 10 mg via RECTAL
  Filled 2014-04-28: qty 1

## 2014-04-28 MED ORDER — FAMOTIDINE 20 MG PO TABS: 20.0000 mg | ORAL_TABLET | Freq: Two times a day (BID) | ORAL | Status: DC

## 2014-04-28 MED ORDER — PROCHLORPERAZINE EDISYLATE 5 MG/ML IJ SOLN
5.0000 mg | Freq: Four times a day (QID) | INTRAMUSCULAR | Status: DC | PRN
  Filled 2014-04-28: qty 2

## 2014-04-28 MED ORDER — HALOPERIDOL 5 MG PO TABS
5.0000 mg | ORAL_TABLET | Freq: Three times a day (TID) | ORAL | Status: DC
  Administered 2014-04-28 – 2014-05-14 (×47): 5 mg via ORAL
  Filled 2014-04-28 (×52): qty 1

## 2014-04-28 MED ORDER — CLONAZEPAM 1 MG PO TABS: 1.0000 mg | ORAL_TABLET | Freq: Every day | ORAL | Status: DC

## 2014-04-28 MED ORDER — ALUM & MAG HYDROXIDE-SIMETH 200-200-20 MG/5ML PO SUSP: 30.0000 mL | ORAL | Status: DC | PRN

## 2014-04-28 MED ORDER — PROCHLORPERAZINE MALEATE 5 MG PO TABS
5.0000 mg | ORAL_TABLET | Freq: Four times a day (QID) | ORAL | Status: DC | PRN
  Filled 2014-04-28: qty 2

## 2014-04-28 MED ORDER — TRAMADOL HCL 50 MG PO TABS
100.0000 mg | ORAL_TABLET | Freq: Four times a day (QID) | ORAL | Status: DC
  Administered 2014-04-28 – 2014-05-06 (×30): 100 mg via ORAL
  Administered 2014-05-06: 50 mg via ORAL
  Administered 2014-05-06 – 2014-05-08 (×7): 100 mg via ORAL
  Filled 2014-04-28 (×37): qty 2

## 2014-04-28 NOTE — Progress Notes (Signed)
Physical Medicine and Rehabilitation Consult  Reason for Consult: Rhabdomyolysis with myositis lumbar and pelvic musculature.  Referring Physician: Dr. Wynelle Cleveland.  HPI: [name] is a 38 y.o. female smoker, with h/o DM with peripheral neuropathy, HTN, Seizures, polysubstance abuse, multiple admissions for substance abuse as well as involuntary commitment; who was admitted on 6/28 after being found down in yard with decreased MS and inability to move legs. She was found to have elevated CK, elevated glucose, lactic acid, severely distended bladder as well as CXR concerning for aspiration. UDS positive for cocaine and patient without memory of events. Foley placed with 2.5 L from bladder and she was treated with IVF for severe rhabdomyolysis as well as IV antibiotics of probable sepsis form aspiration PNA. CT Head/Cervical spine/and abdomen without acute abnormality. CT chest with bilateral upper lobe ground glass opacities. Dr. Arnoldo Morale recommended MRI thoraco-lumbar spine to rule out SCI. MRI toracic spine negative and lumbar spine films revealed confluent severe muscle edema in lower pelvis affecting gluteal muscle, adductors and lower lumbar erector spinae muscles--likely due to severe myositis in setting of Rhabdo. Physical therapy initiated yesterday and patient with BLE weakness and pain affecting mobility. CIR recommended by rehab team and MD.  Pt c/o R>L foot numbness  Weakness in bilateral hips and legs  BMET    Component  Value  Date/Time    NA  136*  04/23/2014 0550    K  3.8  04/23/2014 0550    CL  100  04/23/2014 0550    CO2  22  04/23/2014 0550    GLUCOSE  167*  04/23/2014 0550    BUN  4*  04/23/2014 0550    CREATININE  0.44*  04/23/2014 0550    CALCIUM  8.1*  04/23/2014 0550    GFRNONAA  >90  04/23/2014 0550    GFRAA  >90  04/23/2014 0550    Review of Systems  HENT: Negative for hearing loss.  Eyes: Negative for blurred vision and double vision.  Respiratory: Negative for cough, sputum production,  shortness of breath and wheezing.  Cardiovascular: Negative for chest pain and palpitations.  Musculoskeletal: Positive for back pain, joint pain (bilateral hips) and myalgias.  Neurological: Positive for sensory change (RLE numbness and decreased sensation LLE) and weakness. Negative for dizziness, tingling and headaches.   Past Medical History   Diagnosis  Date   .  Diabetes mellitus without complication    .  Hypertension    .  Cocaine abuse    .  Seizures     Past Surgical History   Procedure  Laterality  Date   .  Cholecystectomy   2005    Family History   Problem  Relation  Age of Onset   .  Hypertension  Other     Social History: Lives alone. Grandparents live next door and support her. Mother can stay with her past discharge. She reports that she has been smoking--1PPD She does not have any smokeless tobacco history on file. She reports that she uses illicit drugs (Cocaine) occasionally. She drinks alcohol when using "doping".  Allergies   Allergen  Reactions   .  Erythromycin  Rash    Medications Prior to Admission   Medication  Sig  Dispense  Refill   .  amitriptyline (ELAVIL) 50 MG tablet  Take 100 mg by mouth at bedtime.     .  clonazePAM (KLONOPIN) 1 MG tablet  Take 1 mg by mouth 3 (three) times daily.     Marland Kitchen  divalproex (DEPAKOTE ER) 500 MG 24 hr tablet  Take 500 mg by mouth 3 (three) times daily.     .  haloperidol (HALDOL) 5 MG tablet  Take 5 mg by mouth 3 (three) times daily.     .  insulin detemir (LEVEMIR) 100 UNIT/ML injection  Inject 60 Units into the skin daily.     .  insulin lispro (HUMALOG) 100 UNIT/ML injection  Inject 2-12 Units into the skin 3 (three) times daily with meals.     Marland Kitchen  QUEtiapine (SEROQUEL) 50 MG tablet  Take 100 mg by mouth at bedtime.      Home:  Home Living  Family/patient expects to be discharged to:: Private residence  Living Arrangements: Alone  Available Help at Discharge: Family;Available 24 hours/day  Type of Home: House  Home  Access: Stairs to enter  [employer] of Steps: 4  Entrance Stairs-Rails: None  Home Layout: One level  Home Equipment: Careers adviser History:  Prior Function  Level of Independence: Independent  Functional Status:  Mobility:  Bed Mobility  Overal bed mobility: Needs Assistance;+2 for physical assistance  Bed Mobility: Rolling;Sidelying to Sit;Sit to Supine  Rolling: Max assist  Sidelying to sit: +2 for physical assistance;Max assist  Supine to sit: Max assist  Sit to supine: +2 for physical assistance;Total assist  General bed mobility comments: Rolled R and L with max assist to place pad at pelvis to help with getting up ; Required total assist to flex bil hips and knees to acheive hooklying position; Painful with rolling both right and left, cues to reach for rails to aide in rolling, needed phsyical assist to reach rails R and L; max assist/support given at pelvis and shoulders as a force couple to acheive sitting  Transfers  Overall transfer level: Needs assistance  Equipment used: (Gait belt/knees blocked)  Transfers: Sit to/from [facility]  Sit to Stand: Total assist;From elevated surface;+2 physical assistance  Stand pivot transfers: +2 physical assistance;Total assist  General transfer comment: Requried bil heavy knees blocking and tight hod on gait belt with +2 assist to acheive standing; Very limited by weakness; Ultimately decided not to get to [facility] (pt reproteing needign to have a BM) for safety reasons; perofrmed semi-stand pivot transfer to move up in the bed for better positioning    ADL:   Cognition:  Cognition  Overall Cognitive Status: Impaired/Different from baseline  Orientation Level: Oriented X4  Cognition  Arousal/Alertness: Awake/alert  Behavior During Therapy: Anxious  Overall Cognitive Status: Impaired/Different from baseline  Area of Impairment: Memory;Following commands;Safety/judgement;Awareness;Problem solving   Following Commands: Follows one step commands with increased time  Safety/Judgement: Decreased awareness of safety;Decreased awareness of deficits  Problem Solving: Slow processing;Decreased initiation;Requires verbal cues;Difficulty sequencing  Blood pressure 120/83, pulse 122, temperature 98.4 F (36.9 C), temperature source Oral, resp. rate 18, height 5\' 5"  (1.651 m), weight 108.092 kg (238 lb 4.8 oz), SpO2 98.00%.  Physical Exam  Nursing note and vitals reviewed.  Constitutional: She is oriented to person, place, and time. She appears well-developed and well-nourished.  Obese female. Flat affect  HENT:  Head: Normocephalic and atraumatic.  Eyes: Conjunctivae are normal. Pupils are equal, round, and reactive to light.  Neck: Normal range of motion. Neck supple.  Cardiovascular: Normal rate and regular rhythm.  No murmur heard.  Respiratory: Effort normal and breath sounds normal. No respiratory distress. She has no wheezes.  GI: Soft. Bowel sounds are normal.  Musculoskeletal:  Bilateral foot drop. Pain  with attempts at ROM bilateral hips, knees and feet.  Neurological: She is alert and oriented to person, place, and time.  Follows commands without difficulty. Paraparesis BLE  Skin: Skin is warm and dry.  decreased sensation both feet at 1st web space,  2- HF, KE, 1/5 R ankle PF, 0/5 R ankle DF, 2-/5 Left toe flexor  Results for orders placed during the hospital encounter of 04/20/14 (from the past 24 hour(s))   GLUCOSE, CAPILLARY Status: Abnormal    Collection Time    04/22/14 1:12 PM   Result  Value  Ref Range    Glucose-Capillary  172 (*)  70 - 99 mg/dL   GLUCOSE, CAPILLARY Status: Abnormal    Collection Time    04/22/14 3:43 PM   Result  Value  Ref Range    Glucose-Capillary  197 (*)  70 - 99 mg/dL    Comment 1  Notify RN    GLUCOSE, CAPILLARY Status: Abnormal    Collection Time    04/22/14 8:56 PM   Result  Value  Ref Range    Glucose-Capillary  240 (*)  70 - 99  mg/dL   GLUCOSE, CAPILLARY Status: Abnormal    Collection Time    04/23/14 1:49 AM   Result  Value  Ref Range    Glucose-Capillary  153 (*)  70 - 99 mg/dL   MAGNESIUM Status: None    Collection Time    04/23/14 5:50 AM   Result  Value  Ref Range    Magnesium  1.6  1.5 - 2.5 mg/dL   PHOSPHORUS Status: None    Collection Time    04/23/14 5:50 AM   Result  Value  Ref Range    Phosphorus  3.3  2.3 - 4.6 mg/dL   BASIC METABOLIC PANEL Status: Abnormal    Collection Time    04/23/14 5:50 AM   Result  Value  Ref Range    Sodium  136 (*)  137 - 147 mEq/L    Potassium  3.8  3.7 - 5.3 mEq/L    Chloride  100  96 - 112 mEq/L    CO2  22  19 - 32 mEq/L    Glucose, Bld  167 (*)  70 - 99 mg/dL    BUN  4 (*)  6 - 23 mg/dL    Creatinine, Ser  0.44 (*)  0.50 - 1.10 mg/dL    Calcium  8.1 (*)  8.4 - 10.5 mg/dL    GFR calc non Af Amer  >90  >90 mL/min    GFR calc Af Amer  >90  >90 mL/min   CK Status: Abnormal    Collection Time    04/23/14 5:50 AM   Result  Value  Ref Range    Total CK  6057 (*)  7 - 177 U/L   GLUCOSE, CAPILLARY Status: Abnormal    Collection Time    04/23/14 7:34 AM   Result  Value  Ref Range    Glucose-Capillary  177 (*)  70 - 99 mg/dL    Dg Chest Port 1 View  04/22/2014 CLINICAL DATA: Assess edema EXAM: PORTABLE CHEST - 1 VIEW COMPARISON: 04/21/2014 FINDINGS: Heart size and pulmonary vascularity are within normal limits. The right lung remains clear. Question mild left lower lobe atelectasis/infiltrate and small left effusion. Slight increase in left lower lobe opacity compared with yesterday. IMPRESSION: Question early infiltrate or atelectasis in the left lower lobe. Electronically Signed By: Franchot Gallo M.D. 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 Does the need for close, 24 hr/day medical supervision in concert with the patient's rehab needs make it unreasonable for this  patient to be served in a less intensive setting? Potentially 2. Co-Morbidities requiring supervision/potential complications: AMS, lactic acidosis, Poly substance abuse 3. Due to bladder management, bowel management, safety, skin/wound care, disease management, medication administration, pain management and patient education, does the patient require 24 hr/day rehab nursing? Potentially 4. Does the patient require coordinated care of a physician, rehab nurse, PT (1-2 hrs/day, 5 days/week), OT (1-2 hrs/day, 5 days/week) and SLP (.5-1 hrs/day, 5 days/week) to address physical and functional deficits in the context of the above medical diagnosis(es)? Potentially Addressing deficits in the following areas: balance, endurance, locomotion, strength, transferring, bowel/bladder control, bathing, dressing, feeding, grooming, toileting and cognition 5. Can the patient actively participate in an intensive therapy program of at least 3 hrs of therapy per day at least 5 days per week? Potentially 6. The potential for patient to make measurable gains while on inpatient rehab is fair 7. Anticipated functional outcomes upon discharge from inpatient rehab are min assist with PT, min assist with OT, supervision with SLP. 8. Estimated rehab length of stay to reach the above functional goals is: 14-20d 9. Does the patient have adequate social supports to accommodate these discharge functional goals? Potentially 10. Anticipated D/C setting: Home 11. Anticipated post D/C treatments: [facility] therapy 12. Overall Rehab/Functional Prognosis: fair RECOMMENDATIONS:  This patient's condition is appropriate for continued rehabilitative care in the following setting: CIR once able to tolerate PT/OT from pain standpoint  Patient has agreed to participate in recommended program. Potentially  Note that insurance prior authorization may be required for reimbursement for recommended care.  Comment:  04/23/2014
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
## 2014-04-28 NOTE — Progress Notes (Signed)
Physical Therapy Treatment Patient Details Name: [name] MRN: [id number] DOB: 1976/10/15 Today's Date: 04/28/2014    History of Present Illness Pt admit after being found down in back yard for unknown time.  Polysubstance abuse with cocaine +.  AMS and rhabdomyolysis.      PT Comments    Continuing progress with functional mobility, including ability to stand briefly x2 and improving ability to perform lateral scooting; Updated CIR admissions; Continue to recommend comprehensive inpatient rehab (CIR) for post-acute therapy needs, anticipate good progress  Follow Up Recommendations  CIR;Supervision/Assistance - 24 hour     Equipment Recommendations  Other (comment) (TBA)    Recommendations for Other Services       Precautions / Restrictions Precautions Precautions: Fall Restrictions Weight Bearing Restrictions: No    Mobility  Bed Mobility Overal bed mobility: Needs Assistance Bed Mobility: Supine to Sit     Supine to sit: HOB elevated;Min assist     General bed mobility comments: Cues for technique; pt. initiated LE movements toward edge of bed, noted p tneeded more assist to help LLE off of bed, min assist to come to sitting at EOB  Transfers Overall transfer level: Needs assistance Equipment used: 2 person hand held assist Transfers: Lateral/Scoot Transfers;Sit to/from Stand Sit to Stand: +2 physical assistance;Max assist;From elevated surface        Lateral/Scoot Transfers: +2 physical assistance;Mod assist General transfer comment: Perofrmed lateral scoot transfer to recliner with armrest down towards pt's Right side; took time t reinforce the need to weight shsift forward over feet to be able to unweigh hips for scooting; Therapist and tech used bed pad to guide hips to recliner; From chair worked on sit <> stand transfers with bil knee blocking and bil support given at gait belt; more success with second trial, pt able to maintain knee extension briefly   without blockind (though may have gone into hyperextension)  Ambulation/Gait                 Stairs            Wheelchair Mobility    Modified Rankin (Stroke Patients Only)       Balance   Sitting-balance support: No upper extremity supported Sitting balance-Leahy Scale: Good     Standing balance support: Bilateral upper extremity supported Standing balance-Leahy Scale: Zero                      Cognition Arousal/Alertness: Awake/alert Behavior During Therapy: WFL for tasks assessed/performed Overall Cognitive Status: Within Functional Limits for tasks assessed                      Exercises      General Comments        Pertinent Vitals/Pain Reports sensation of "nails" in the bottoms of her feet with standing, but still agreeable to keep trying    Home Living                      Prior Function            PT Goals (current goals can now be found in the care plan section) Acute Rehab PT Goals Patient Stated Goal: to go to rehab PT Goal Formulation: With patient Time For Goal Achievement: 05/06/14 Potential to Achieve Goals: Good Progress towards PT goals: Progressing toward goals    Frequency  Min 3X/week    PT Plan Current plan remains appropriate  Co-evaluation             End of Session Equipment Utilized During Treatment: Gait belt Activity Tolerance: Patient tolerated treatment well Patient left: in chair;with call bell/phone within reach;with family/visitor present     Time: [id number] PT Time Calculation (min): 22 min  Charges:  $Therapeutic Activity: 8-22 mins                    G Codes:      Roney Marion Hamff 04/28/2014, 12:14 PM Roney Marion, [place] Pager [phone] Office [phone]

## 2014-04-28 NOTE — Progress Notes (Signed)
I met with pt at bedside and discussed her progress with P.T. For today. Has progressed well with therapy and stood 3 times with therapy as well as Nursing this morning. I can admit pt to inpt rehab today and she is in agreement. I will arrange for today. I have alerted Attending MD, RN [place], and SW. [phone]

## 2014-04-28 NOTE — Plan of Care (Signed)
Problem: Phase I Progression Outcomes Goal: OOB as tolerated unless otherwise ordered Outcome: Completed/Met Date Met:  04/28/14 OOB to chair.
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
## 2014-04-28 NOTE — Progress Notes (Addendum)
Subjective: Patient feels her left leg has significantly improved.  States she stood up 3 times today which is a big improvement.   Objective: Current vital signs: BP 103/66  Pulse 111  Temp(Src) 98.2 F (36.8 C) (Oral)  Resp 18  Ht 5\' 5"  (1.651 m)  Wt 114.579 kg (252 lb 9.6 oz)  BMI 42.03 kg/m2  SpO2 98% Vital signs in last 24 hours: Temp:  [97.6 F (36.4 C)-98.2 F (36.8 C)] 98.2 F (36.8 C) (07/06 1000) Pulse Rate:  [66-111] 111 (07/06 1000) Resp:  [18] 18 (07/06 1000) BP: (103-109)/(64-76) 103/66 mmHg (07/06 1000) SpO2:  [94 %-98 %] 98 % (07/06 1000) Weight:  [114.579 kg (252 lb 9.6 oz)] 114.579 kg (252 lb 9.6 oz) (07/06 0500)  Intake/Output from previous day: 07/05 0701 - 07/06 0700 In: 480 [P.O.:480] Out: -  Intake/Output this shift: Total I/O In: 120 [P.O.:120] Out: -  Nutritional status: Carb Control  Neurologic Exam: Mental Status: Alert, oriented, thought content appropriate.  Speech fluent without evidence of aphasia.  Able to follow 3 step commands without difficulty. Cranial Nerves: II: Discs flat bilaterally; Visual fields grossly normal, pupils equal, round, reactive to light and accommodation III,IV, VI: ptosis not present, extra-ocular motions intact bilaterally V,VII: smile symmetric, facial light touch sensation normal bilaterally VIII: hearing normal bilaterally IX,X: gag reflex present XI: bilateral shoulder shrug XII: midline tongue extension without atrophy or fasciculations  Motor: Right : Upper extremity   5/5    Left:     Upper extremity   5/5  Lower extremity   3/5     Lower extremity   4/5 Distal weaker than proximal.  Tone and bulk:normal tone throughout; no atrophy noted Sensory: Pinprick and light touch decreased on the right LE from foot to knee and decreased on the left LE from foot to ankle. She can feel pins and needle on bilateral plantar surfaces of feet.  Deep Tendon Reflexes:  Right: Upper Extremity   Left: Upper extremity    biceps (C-5 to C-6) 2/4   biceps (C-5 to C-6) 2/4 tricep (C7) 2/4    triceps (C7) 2/4 Brachioradialis (C6) 2/4  Brachioradialis (C6) 2/4  Lower Extremity Lower Extremity  quadriceps (L-2 to L-4) 2/4   quadriceps (L-2 to L-4) 2/4 Achilles (S1) 0/4   Achilles (S1) 0/4  Plantars: Right: downgoing   Left: downgoing Cerebellar: normal finger-to-nose,     Lab Results: Basic Metabolic Panel:  Recent Labs Lab 04/22/14 0555 04/23/14 0550 04/24/14 0450 04/26/14 0454  NA 140 136* 137 139  K 3.9 3.8 3.9 4.3  CL 106 100 102 102  CO2 19 22 23 27   GLUCOSE 125* 167* 244* 166*  BUN 4* 4* 7 11  CREATININE 0.43* 0.44* 0.47* 0.58  CALCIUM 7.7* 8.1* 8.4 8.7  MG 1.8 1.6 1.6  --   PHOS 2.2* 3.3 4.2  --     Liver Function Tests:  Recent Labs Lab 04/22/14 0555  AST 160*  ALT 86*  ALKPHOS 84  BILITOT 0.4  PROT 5.3*  ALBUMIN 1.7*   No results found for this basename: LIPASE, AMYLASE,  in the last 168 hours No results found for this basename: AMMONIA,  in the last 168 hours  CBC:  Recent Labs Lab 04/22/14 0555 04/26/14 0454  WBC 9.4 5.1  NEUTROABS 6.6  --   HGB 13.6 11.9*  HCT 40.9 35.3*  MCV 95.6 92.7  PLT 228 311    Cardiac Enzymes:  Recent Labs Lab 04/24/14 0450  04/25/14 0500 04/26/14 0454 04/27/14 0400 04/28/14 0424  CKTOTAL 2293* 1208* 817* 606* 490*    Lipid Panel:  Recent Labs Lab 04/24/14 1430  CHOL 179  TRIG 172*  HDL 32*  CHOLHDL 5.6  VLDL 34  LDLCALC 113*    CBG:  Recent Labs Lab 04/27/14 0823 04/27/14 1139 04/27/14 1727 04/27/14 2054 04/28/14 0733  GLUCAP 128* 159* 227* 203* 182*    Microbiology: Results for orders placed during the hospital encounter of 04/20/14  CULTURE, BLOOD (ROUTINE X 2)     Status: None   Collection Time    04/20/14 10:54 AM      Result Value Ref Range Status   Specimen Description BLOOD LEFT WRIST   Final   Special Requests BOTTLES DRAWN AEROBIC AND ANAEROBIC 5CC   Final   Culture  Setup Time      Final   Value: 04/20/2014 22:49     Performed at [facility]   Culture     Final   Value: NO GROWTH 5 DAYS     Performed at [facility]   Report Status 04/26/2014 FINAL   Final  CULTURE, BLOOD (ROUTINE X 2)     Status: None   Collection Time    04/20/14 10:59 AM      Result Value Ref Range Status   Specimen Description BLOOD RIGHT HAND   Final   Special Requests BOTTLES DRAWN AEROBIC ONLY 10CC   Final   Culture  Setup Time     Final   Value: 04/20/2014 22:49     Performed at [facility]   Culture     Final   Value: NO GROWTH 5 DAYS     Performed at [facility]   Report Status 04/26/2014 FINAL   Final  MRSA PCR SCREENING     Status: Abnormal   Collection Time    04/20/14  6:01 PM      Result Value Ref Range Status   MRSA by PCR POSITIVE (*) NEGATIVE Final   Comment:            The GeneXpert MRSA Assay (FDA     approved for NASAL specimens     only), is one component of a     comprehensive MRSA colonization     surveillance program. It is not     intended to diagnose MRSA     infection nor to guide or     monitor treatment for     MRSA infections.     RESULT CALLED TO, READ BACK BY AND VERIFIED WITH:     ROYAL,K RN 2010 04/20/14 MITCHELL,L    Coagulation Studies: No results found for this basename: LABPROT, INR,  in the last 72 hours  Imaging: No results found.  Medications:  Scheduled: . amitriptyline  100 mg Oral QHS  . ampicillin-sulbactam (UNASYN) IV  3 g Intravenous Q6H  . clonazePAM  1 mg Oral QHS  . famotidine  20 mg Oral BID  . gabapentin  300 mg Oral TID  . haloperidol  5 mg Oral TID  . heparin  5,000 Units Subcutaneous 3 times per day  . insulin aspart  0-20 Units Subcutaneous TID WC  . insulin detemir  20 Units Subcutaneous QHS  . naproxen  375 mg Oral BID WC  . QUEtiapine  100 mg Oral QHS  . traMADol  100 mg Oral QID    Assessment/Plan:  Patient now improving with both strength and sensation. Currently  accepted to CIR.    Recommend: Continue therapy  ----If no improvement after CIR would recommend out patient EMG/NCV.   No further neurologic intervention is recommended at this time.  If further questions arise, please call or page at that time.  Thank you for allowing neurology to participate in the care of this patient.  Etta Quill PA-C [facility] [phone]  04/28/2014, 11:49 AM        Etta Quill PA-C [facility] [phone]  04/28/2014, 11:44 AM

## 2014-04-28 NOTE — H&P (Signed)
Physical Medicine and Rehabilitation Admission H&P  CC: BLE weakness due to rhabdomyolysis  HPI: [name] is a 38 y.o. female smoker, with h/o DM with peripheral neuropathy, HTN, seizures, suicidal ideation--most recent 03/16/14, polysubstance abuse, multiple admissions for substance abuse as well as involuntary commitment; who was admitted on 6/28 after being found down in yard with decreased MS and inability to move legs. She was found to have elevated CK, elevated glucose, lactic acid, severely distended bladder as well as CXR concerning for aspiration. UDS positive for cocaine and patient without memory of events. Foley placed with 2.5 L from bladder and she was treated with IVF for severe rhabdomyolysis as well as IV antibiotics of probable sepsis form aspiration PNA. CT Head/Cervical spine/and abdomen without acute abnormality. CT chest with bilateral upper lobe ground glass opacities. Dr. Arnoldo Morale recommended MRI thoraco-lumbar spine to rule out SCI. MRI toracic spine negative and lumbar spine films revealed confluent severe muscle edema in lower pelvis affecting gluteal muscle, adductors and lower lumbar erector spinae muscles--likely due to severe myositis in setting of Rhabdo.  Neurology consulted for input and felt that patient's symptoms likely due to an axonal sensory-motor neuropathy as well as a lumbosacral plexopathy due to severe compressive myositis in the setting of generalized rhabdomyolysis. NCS/EMG recommended for follow up but unavailable in hospital setting. Dr. Cathie Olden consulted for input on global hypokinesis and felt that EF c/w cocaine use and recommended follow up on outpatient basis as well as stopping use of cocaine, ETOH and tobacco. Patient was treated with IV toradol and placed on naprosyn as well as tramadol for pain management. She was started on neurontin for neuropathic symptoms. She developed folliculitis RLE and was placed on Unasyn this weekend with improvement.  Foley discontinued and patient with incontinence. She has had improvement in motor strength as well as activity level. CIR was recommended by rehab team and patient admitted today.  ROS  HENT: Negative for hearing loss.  Eyes: Negative for blurred vision and double vision.  Respiratory: Negative for cough and shortness of breath.  Cardiovascular: Positive for leg swelling (since admission). Negative for chest pain and palpitations.  Gastrointestinal: Negative for heartburn, nausea, abdominal pain and constipation.  Genitourinary: Positive for urgency and frequency.  Peeing everywhere  Musculoskeletal: Positive for back pain and myalgias.  Neurological: Positive for tingling, sensory change and focal weakness. Negative for headaches.  Psychiatric/Behavioral: Negative for depression and suicidal ideas. The patient has insomnia. The patient is not nervous/anxious  Past Medical History   Diagnosis  Date   .  Diabetes mellitus without complication    .  Hypertension    .  Cocaine abuse    .  Seizures    .  Chronic combined systolic and diastolic CHF (congestive heart failure)      a. ECHO 04/23/14 LV EF: 45% - 50%, mild LVH, diffuse hypokinesis. LV diastolic dysfunction, high ventricular filling pressure. Mild LA dilation.    Past Surgical History   Procedure  Laterality  Date   .  Cholecystectomy   2005    Family History   Problem  Relation  Age of Onset   .  Hypertension  Other    .  Drug abuse  Mother     Social History: Lives alone. Grandparents live next door and support her financially. Mother can stay with her past discharge. She reports that she has been smoking--1PPD She does not have any smokeless tobacco history on file. She reports that she uses illicit  drugs (Cocaine) occasionally. She drinks alcohol when "doping  Allergies   Allergen  Reactions   .  Erythromycin  Rash    Medications Prior to Admission   Medication  Sig  Dispense  Refill   .  amitriptyline (ELAVIL) 50 MG  tablet  Take 100 mg by mouth at bedtime.     Marland Kitchen  amoxicillin-clavulanate (AUGMENTIN) 875-125 MG per tablet  Take 1 tablet by mouth 2 (two) times daily.     .  clonazePAM (KLONOPIN) 1 MG tablet  Take 1 tablet (1 mg total) by mouth at bedtime.  30 tablet  0   .  famotidine (PEPCID) 20 MG tablet  Take 1 tablet (20 mg total) by mouth 2 (two) times daily.     Marland Kitchen  gabapentin (NEURONTIN) 300 MG capsule  Take 1 capsule (300 mg total) by mouth 3 (three) times daily.     .  haloperidol (HALDOL) 5 MG tablet  Take 5 mg by mouth 3 (three) times daily.     .  insulin detemir (LEVEMIR) 100 UNIT/ML injection  Inject 0.2 mLs (20 Units total) into the skin at bedtime.  10 mL  11   .  insulin lispro (HUMALOG) 100 UNIT/ML injection  Inject 2-12 Units into the skin 3 (three) times daily with meals.     .  naproxen (NAPROSYN) 375 MG tablet  Take 1 tablet (375 mg total) by mouth 2 (two) times daily with a meal.     .  QUEtiapine (SEROQUEL) 50 MG tablet  Take 100 mg by mouth at bedtime.     .  traMADol (ULTRAM) 50 MG tablet  Take 2 tablets (100 mg total) by mouth 4 (four) times daily.  30 tablet     Home:  Home Living  Family/patient expects to be discharged to:: Private residence  Living Arrangements: Alone  Available Help at Discharge: Family;Available 24 hours/day;Other (Comment) (Mom and cousin , Silva Bandy available 24/7 after d/c)  Type of Home: Mobile home  Home Access: Stairs to enter  Entrance Stairs-Number of Steps: 2 steps in back; family building ramp  Entrance Stairs-Rails: None  Home Layout: One level  Home Equipment: Shower seat  Additional Comments: pt states she doubts a wheelchair can get into her bathroom or bedroom. Would have to set up bed in her den  Lives With: Alone  Functional History:  Prior Function  Level of Independence: Independent  Comments: unemployed  Functional Status:  Mobility:  Bed Mobility  Overal bed mobility: Needs Assistance  Bed Mobility: Supine to Sit  Rolling: Max  assist  Sidelying to sit: Max assist  Supine to sit: HOB elevated;Min assist  Sit to supine: Max assist  General bed mobility comments: Cues for technique; pt. initiated LE movements toward edge of bed, noted p tneeded more assist to help LLE off of bed, min assist to come to sitting at EOB  Transfers  Overall transfer level: Needs assistance  Equipment used: 2 person hand held assist  Transfers: Lateral/Scoot Transfers;Sit to/from Stand  Sit to Stand: +2 physical assistance;Max assist;From elevated surface  Stand pivot transfers: +2 physical assistance;Total assist  Lateral/Scoot Transfers: +2 physical assistance;Mod assist  General transfer comment: Perofrmed lateral scoot transfer to recliner with armrest down towards pt's Right side; took time t reinforce the need to weight shsift forward over feet to be able to unweigh hips for scooting; Therapist and tech used bed pad to guide hips to recliner; From chair worked on sit <> stand transfers with  bil knee blocking and bil support given at gait belt; more success with second trial, pt able to maintain knee extension briefly without blockind (though may have gone into hyperextension)  Ambulation/Gait  Ambulation/Gait assistance: (pt. unable )   ADL:  ADL  Overall ADL's : Needs assistance/impaired  Grooming: Wash/dry hands;Wash/dry face;Brushing hair;Set up;Sitting  Upper Body Bathing: Minimal assitance;Sitting  Lower Body Bathing: Total assistance  Upper Body Dressing : Minimal assistance;Sitting  Lower Body Dressing: Total assistance  Lower Body Dressing Details (indicate cue type and reason): donned socks  Toilet Transfer: +2 for physical assistance;Maximal assistance;Requires drop arm  Toilet Transfer Details (indicate cue type and reason): pt transferred to drop arm recliner with assist for scooting hips over with use of draw pad  Toileting- Clothing Manipulation and Hygiene: Moderate assistance;Sitting/lateral lean  General ADL  Comments: Pt able to simulate toileting hygiene while sitting EOB with lateral leans to left and right sides.  Cognition:  Cognition  Overall Cognitive Status: Within Functional Limits for tasks assessed  Orientation Level: Oriented X4  Cognition  Arousal/Alertness: Awake/alert  Behavior During Therapy: WFL for tasks assessed/performed  Overall Cognitive Status: Within Functional Limits for tasks assessed  Area of Impairment: Memory;Following commands;Safety/judgement;Awareness;Problem solving  Following Commands: Follows one step commands with increased time  Safety/Judgement: Decreased awareness of safety;Decreased awareness of deficits  Problem Solving: Slow processing;Decreased initiation;Requires verbal cues;Difficulty sequencing  General Comments: Pt. able to follow directions and participate fully in session. I gave pt. several tasks to be responsible for and she seems prepared to assume responsibility for these tasks (notifying nursing staff of 2 hour on 2 hour off schedule for [employer] positioning boots. stretching her heel cords with safety belt in pt. room while [employer] bootys off and completing knee extension exercises in sitting position while up in chair and in bed 3x/day  Physical Exam:  Blood pressure 103/66, pulse 111, temperature 98.2 F (36.8 C), temperature source Oral, resp. rate 18, height 5\' 5"  (1.651 m), weight 114.579 kg (252 lb 9.6 oz), SpO2 98.00%.  Physical Exam  Nursing note and vitals reviewed.  Constitutional: She is oriented to person, place, and time. She appears well-developed and well-nourished.  Obese female. Flat affect  HENT:  Head: Normocephalic and atraumatic.  Eyes: Conjunctivae are normal. Pupils are equal, round, and reactive to light.  Neck: Normal range of motion. Neck supple.  Cardiovascular: Normal rate and regular rhythm.  No murmur heard.  Respiratory: Effort normal and breath sounds normal. No respiratory distress. She has no wheezes.  GI:  Soft. Bowel sounds are normal.  Musculoskeletal:   Denies any pain with attempts at ROM bilateral hips, knees and feet. 2+ pitting edema BLE from hips to feet-- proximal > distal. Evidence of healing/drying folliculitis RLE. Vesicular rash Neurological: She is alert and oriented to person, place, and time.  Follows commands without difficulty. Paraparesis BLE    decreased sensation below right knee bilaterally --tr/2 RLE and 1/2 LLE,  2- HF, RKE trace, LKE 2/5, 1/5 R ankle PF, 0/5 R ankle DF, 2-/5 R PF, trace R DF.  DTR's trace to absent in LE's Psych: pt pleasant and generally cooperative.   Results for orders placed during the hospital encounter of 04/20/14 (from the past 48 hour(s))   GLUCOSE, CAPILLARY Status: Abnormal    Collection Time    04/26/14 5:32 PM   Result  Value  Ref Range    Glucose-Capillary  263 (*)  70 - 99 mg/dL   GLUCOSE, CAPILLARY Status: Abnormal  Collection Time    04/26/14 8:00 PM   Result  Value  Ref Range    Glucose-Capillary  171 (*)  70 - 99 mg/dL   CK Status: Abnormal    Collection Time    04/27/14 4:00 AM   Result  Value  Ref Range    Total CK  606 (*)  7 - 177 U/L   GLUCOSE, CAPILLARY Status: Abnormal    Collection Time    04/27/14 8:23 AM   Result  Value  Ref Range    Glucose-Capillary  128 (*)  70 - 99 mg/dL   GLUCOSE, CAPILLARY Status: Abnormal    Collection Time    04/27/14 11:39 AM   Result  Value  Ref Range    Glucose-Capillary  159 (*)  70 - 99 mg/dL   GLUCOSE, CAPILLARY Status: Abnormal    Collection Time    04/27/14 5:27 PM   Result  Value  Ref Range    Glucose-Capillary  227 (*)  70 - 99 mg/dL   GLUCOSE, CAPILLARY Status: Abnormal    Collection Time    04/27/14 8:54 PM   Result  Value  Ref Range    Glucose-Capillary  203 (*)  70 - 99 mg/dL   CK Status: Abnormal    Collection Time    04/28/14 4:24 AM   Result  Value  Ref Range    Total CK  490 (*)  7 - 177 U/L   GLUCOSE, CAPILLARY Status: Abnormal    Collection Time     04/28/14 7:33 AM   Result  Value  Ref Range    Glucose-Capillary  182 (*)  70 - 99 mg/dL   GLUCOSE, CAPILLARY Status: Abnormal    Collection Time    04/28/14 12:04 PM   Result  Value  Ref Range    Glucose-Capillary  223 (*)  70 - 99 mg/dL    No results found.  Medical Problem List and Plan:  1. 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 DVT Prophylaxis/Anticoagulation: Pharmaceutical: Lovenox  3. Pain Management: Will discontinue naprosyn with history of rhabdomyolysis. Continue neurontin 300 mg tid as well as elavil 100 mg at bedtime. Monitor for SE of medications.  4. Mood: No signs of depression or anxiety noted. LCSW to follow for evaluation and support. Medications compared with care everywhere notes ( tegretol 500 mg tid, Trazodone 100 Mg/HS prn, cogentin 1 mg tid and haldol 5mg  tid) and patient's input conflicts with records. Mother advised to bring medication in. Will get records release form to get formation from [facility] also. Continue current klonopin, Seroquel and haldol. 5. Neuropsych: This patient is capable of making decisions on her own behalf.  6. DM type 2--poorly controlled: Hgb A1c-10.4. She reports that she used novolog tid ac and levemir 60 units at home. Will increase levemir to 20 units bid and titrate as indicated.  7. Urinary incontinence: Check UA/UCS. Monitor voiding with PVR checks as sounds like she is overflowing. Cath for volumes > [id number] cc.  8. Folliculitis: will change Unasyn to keflex for 7 total day treatment.  9. Renal cysts: Will need follow up with PMD on outpatient basis for workup.  10. Peripheral edema: Rule out DVT as cause. Likely due to compression and paraparesis. Encourage elevation. TEDs. May need diuretics additionally to help with symptoms.  Post Admission Physician Evaluation:  1. Functional deficits secondary to myopathy due to [employer]  in gluteal region as well as  bilat deep peroneal neuropathy  1. Patient is admitted to receive collaborative, interdisciplinary care between the physiatrist, rehab nursing staff, and therapy team. 2. Patient's level of medical complexity and substantial therapy needs in context of that medical necessity cannot be provided at a lesser intensity of care such as a SNF. 3. Patient has experienced substantial functional loss from his/her baseline which was documented above under the "Functional History" and "Functional Status" headings. Judging by the patient's diagnosis, physical exam, and functional history, the patient has potential for functional progress which will result in measurable gains while on inpatient rehab. These gains will be of substantial and practical use upon discharge in facilitating mobility and self-care at the household level. 4. Physiatrist will provide 24 hour management of medical needs as well as oversight of the therapy plan/treatment and provide guidance as appropriate regarding the interaction of the two. 5. 24 hour rehab nursing will assist with bladder management, bowel management, safety, skin/wound care, disease management, medication administration, pain management and patient education and help integrate therapy concepts, techniques,education, etc. 6. PT will assess and treat for/with: Lower extremity strength, range of motion, stamina, balance, functional mobility, safety, adaptive techniques and equipment, pain mgt, egosupport, NMR. Goals are: supervision to mod I, ? At w/c level 7. OT will assess and treat for/with: ADL's, functional mobility, safety, upper extremity strength, adaptive techniques and equipment, NMR, pain mgt  Goals are: supervision to mod I. 8. SLP will assess and treat for/with: n/a. Goals are: n/a. 9. Case Management and Social Worker will assess and treat for psychological issues and discharge planning. 10. Team conference will be held weekly to assess progress toward goals and to  determine barriers to discharge. 11. Patient will receive at least 3 hours of therapy per day at least 5 days per week. 12. ELOS: 18-22 days  13. Prognosis: excellent  Meredith Staggers, MD, [facility]   04/28/2014

## 2014-04-28 NOTE — Discharge Summary (Signed)
Physician Discharge Summary  [name] [id number] DOB: 05/12/1976 DOA: 04/20/2014  PCP: Suzan Garibaldi, FNP  Admit date: 04/20/2014 Discharge date: 04/28/2014  Time spent: >45 min minutes  Recommendations for Outpatient Follow-up:  1. MRI of renals as outpt for renal cysts 2. Will be discharged to CIR 3. Taper Naproxen and Tramadol as able  Discharge Diagnoses:  Principal Problem:   Rhabdomyolysis Active Problems:   Toxic encephalopathy   Lactic acid acidosis   Pseudoseizure   Myositis--Paraparesis of both lower limbs   Cocaine abuse   Bipolar disorder, unspecified   Morbid obesity   DM type 2 (diabetes mellitus, type 2)- poorly controlled   Discharge Condition: stable  Diet recommendation: heart healthy, carb modified  Filed Weights   04/25/14 2145 04/27/14 0417 04/28/14 0500  Weight: 113.762 kg (250 lb 12.8 oz) 113.853 kg (251 lb) 114.579 kg (252 lb 9.6 oz)    History of present illness:  [name] is a 38 y.o. female smoker, with PMH of DM, HTN, pseudoseizures, cocaine and alcohol abuse, multiple admissions for substance abuse and involuntary commitment admitted on 6/28 after being found down in yard. Found to have elevated CK, glucose, lactic acid and CXR concerning for aspiration. UDS positive for cocaine. Patient unable to move lower extremities. Grandmother at bedside has told me the patient was evaluated at [facility] for 3 days for her h/o seizures and was told that she had pseudoseizures. She continues to have these pseudoseizures at home.  On the day she was admitted, she admits to smoking cocaine and drinking gin but cannot recall how much. She was found face down in her yard and does not recall how she got there. She was taking all of her pscy meds appropriately at home for the past 2 wks but was taking her Levemir irregularly   SIGNIFICANT EVENTS and STUDIES:  6/28 - Admitted to ICU - - elevated CK, glucose, & lactic acid. CXR concerning for  aspiration. UDS positive for cocaine, pt with no memory of events.  6/28 CT Head>>>neg for ICH but has ethmoid and maxillary sinusitis  6/28 CT C-Spine>>>no cervical fx  6/26 CT Chest >>>bilateral upper GGO  6/28 CT ABD/Pelvis>>>no acute abnormality, small bilateral renal lesions (? Cysts)  6/28 - MRI T spine: normal  6/28 - MRI L spine- no stenosis, severe gluteal, pelvic and erector spinae mysositis (pulses in lowers are dopplered +)    Hospital Course:  Principal Problem:  Rhabdomyolysis  - - CK has come down significantly from 44,127 on admission  - hydration stopped due to underlying systolic CHF noted on ECHO here  - pt drinking fluids quite well   Active Problems:  Acute toxic encephalopathy  - pt was not sure of how she got out to the yard  -CT head negative  - suspect cocaine and alcohol intoxication  - resolved   Folliculitis on right lower leg  - started Doxy but began to appear on left leg- switched to Unasyn and now resolving- will switch to Augmentin for 4 more days on d/c  Elevated LFTs  - improving- likely related to rhabdo- no fatty liver on CT   Paraparesis of both lower limbs/ Myositis  - due to being on ground??- she was found face down but her myositis is in gluteal area, erector spinae (left greater than right) and adductor muscles (right more than left)  - d/c narcotics  - started Toradol and Tramadol for pain which helped significantly to control pain and increase  motility  - stopped Toradol after 4 days and changed to Naproxyn - continues to do well with pain control  Left leg numbness-  - asked for a neuro consult to evaluate- they also suspect compression of nerves in relation to above myositis- sensation is returning   Peripheral neuropathy  - feet burning now as sensation returns- chronic issue and has used Neurontin in the past - started Neurontin- follow for sedation   HCAP??and b/l Maxillary sinusiits ( on CT)  - b/l upper lobe on CT and CXR   - Zosyn started on 6/28 but stopped the following day by PCCM  - is completely asymptomatic   Renal cysts? - seen on CT  f/u MRI recommended by radiology   Pseudoseizures  - none while in hospital   Cocaine and alcohol abuse  - states she will quit  - will have case management assist with giving info on local services   DM  - non- compliance with taking Levemir regularly (has no insurance but gets samples from PCP)  - interesting that 20 mg of Levemir has controlled her sugars here - cont sliding scale as wel - A1c 10   Bipolar  - states she sleep a lot during the day but she takes her psych meds as ordered  - resume home meds- Clonazepam only QHS rather than TID, Haldol TID, Seroquel at bedtime- hold Depakote TID for now (note no Depakote level obtained on admission)  - monitor for oversedation during the day  - currently patient feels well with this regimen   Tachycardia  - sinus tach  - TSH high but free T4 normal suggesting sick euthyroid syndrome - may be due to pain and deconditioning  - ECHO below  Chronic systolic CHF  - ECHO shows LVH and diffuse hypokinesis- EF 45-50 % and LA is dilated-  - per cardiology, this likely is ischemic in nature from chronic cocaine use  - will need to add ACE when BP able to tolerate   Anorexia  -resolved   Hyperlipidemia  - needs low chol diet and control of sugars    Procedures:  none  Consultations:  Neuro  PCCM  Cardiology  Discharge Exam: Filed Vitals:   04/28/14 1000  BP: 103/66  Pulse: 111  Temp: 98.2 F (36.8 C)  Resp: 18    General: No acute respiratory distress-morbidly obese Lungs: Clear to auscultation bilaterally without wheezes or crackles  Cardiovascular: Regular rate and rhythm without murmur gallop or rub normal S1 and S2- tachycardic in low 100s  Abdomen: Nontender, nondistended, soft, bowel sounds positive- morbidly obese  Extremities: No significant cyanosis, clubbing, or edema bilateral  lower extremities-  MSK: lifting legs without problems now  Neuro: poor sensation to touch below the knee on right leg-   Discharge Instructions You were cared for by a hospitalist during your hospital stay. If you have any questions about your discharge medications or the care you received while you were in the hospital after you are discharged, you can call the unit and asked to speak with the hospitalist on call if the hospitalist that took care of you is not available. Once you are discharged, your primary care physician will handle any further medical issues. Please note that NO REFILLS for any discharge medications will be authorized once you are discharged, as it is imperative that you return to your primary care physician (or establish a relationship with a primary care physician if you do not have one) for your aftercare  needs so that they can reassess your need for medications and monitor your lab values.     Medication List    ASK your doctor about these medications       amitriptyline 50 MG tablet  Commonly known as:  ELAVIL  Take 100 mg by mouth at bedtime.     clonazePAM 1 MG tablet  Commonly known as:  KLONOPIN  Take 1 mg by mouth 3 (three) times daily.     divalproex 500 MG 24 hr tablet  Commonly known as:  DEPAKOTE ER  Take 500 mg by mouth 3 (three) times daily.     haloperidol 5 MG tablet  Commonly known as:  HALDOL  Take 5 mg by mouth 3 (three) times daily.     insulin detemir 100 UNIT/ML injection  Commonly known as:  LEVEMIR  Inject 60 Units into the skin daily.     insulin lispro 100 UNIT/ML injection  Commonly known as:  HUMALOG  Inject 2-12 Units into the skin 3 (three) times daily with meals.     QUEtiapine 50 MG tablet  Commonly known as:  SEROQUEL  Take 100 mg by mouth at bedtime.       Allergies  Allergen Reactions  . Erythromycin Rash      The results of significant diagnostics from this hospitalization (including imaging, microbiology,  ancillary and laboratory) are listed below for reference.    Significant Diagnostic Studies: Ct Head Wo Contrast  04/20/2014   CLINICAL DATA:  Altered mental status  EXAM: CT HEAD WITHOUT CONTRAST  CT CERVICAL SPINE WITHOUT CONTRAST  TECHNIQUE: Multidetector CT imaging of the head and cervical spine was performed following the standard protocol without intravenous contrast. Multiplanar CT image reconstructions of the cervical spine were also generated.  COMPARISON:  06/26/2008  FINDINGS: CT HEAD FINDINGS  No skull fracture is noted. No intracranial hemorrhage, mass effect or midline shift. There is mild mucosal thickening with air-fluid level posterior aspect bilateral maxillary sinus. The mastoid air cells are unremarkable.  No hydrocephalus. No intra or extra-axial fluid collection. No acute cortical infarction. No mass lesion is noted on this unenhanced scan. Mild mucosal thickening with partial opacification bilateral ethmoid air cells.  CT CERVICAL SPINE FINDINGS  Axial images of the cervical spine shows no acute fracture or subluxation. Computer processed images shows no acute fracture or subluxation. Alignment, disc spaces and vertebral heights are preserved.  There is no pneumothorax in visualized lung apices.  IMPRESSION: 1. No acute intracranial abnormality. 2. Mild mucosal thickening with air-fluid level bilateral maxillary sinus. Mild mucosal thickening with partial opacification bilateral ethmoid air cells. 3. No cervical spine acute fracture or subluxation. No prevertebral soft tissue swelling. Cervical airway is patent.   Electronically Signed   By: Lahoma Crocker M.D.   On: 04/20/2014 13:46   Ct Chest W Contrast  04/20/2014   CLINICAL DATA:  Possible trauma.  Altered mental status.  EXAM: CT CHEST, ABDOMEN, AND PELVIS WITH CONTRAST  TECHNIQUE: Multidetector CT imaging of the chest, abdomen and pelvis was performed following the standard protocol during bolus administration of intravenous contrast.   CONTRAST:  173mL OMNIPAQUE IOHEXOL 300 MG/ML  SOLN  COMPARISON:  Chest radiograph earlier today  FINDINGS: CT CHEST FINDINGS  There is no evidence of great vessel injury. 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 The heart is normal in size. No enlarged axillary, mediastinal, or hilar lymph nodes are seen. There is no pleural or pericardial effusion. There is  no pneumothorax. Ground-glass opacities are present in both upper lobes. Minimal subpleural opacity dependently in the lower lobes likely represents atelectasis. No acute osseous abnormality is identified in the chest.  CT ABDOMEN AND PELVIS FINDINGS  The gallbladder is surgically absent. The liver, spleen, adrenal glands, and pancreas have an unremarkable enhanced appearance. Hypodense renal lesions measure 1.2 cm in the right upper pole and 8 mm in the interpolar region on the left.  A Foley catheter is present in the bladder, which is decompressed. Uterus and ovaries are grossly unremarkable. The bowel is grossly unremarkable. No free fluid or enlarged lymph nodes are identified. Abdominal aorta and its major branch vessels are patent. No acute osseous abnormality is identified. Mild facet arthrosis is noted in the lower lumbar spine.  IMPRESSION: 1. Bilateral upper lobe ground-glass opacities, concerning for multifocal infection or possibly contusion given the possibility of trauma. 2. No acute abnormality identified in the abdomen or pelvis. 3. Small bilateral hypodense renal lesions, possibly cysts but indeterminate on this study. Consider further evaluation with nonemergent abdominal MRI after the patient's acute illness has resolved.   Electronically Signed   By: Logan Bores   On: 04/20/2014 13:58   Ct Cervical Spine Wo Contrast  04/20/2014   CLINICAL DATA:  Altered mental status  EXAM: CT HEAD WITHOUT CONTRAST  CT CERVICAL SPINE WITHOUT CONTRAST  TECHNIQUE: Multidetector CT imaging of the head and cervical spine was performed  following the standard protocol without intravenous contrast. Multiplanar CT image reconstructions of the cervical spine were also generated.  COMPARISON:  06/26/2008  FINDINGS: CT HEAD FINDINGS  No skull fracture is noted. No intracranial hemorrhage, mass effect or midline shift. There is mild mucosal thickening with air-fluid level posterior aspect bilateral maxillary sinus. The mastoid air cells are unremarkable.  No hydrocephalus. No intra or extra-axial fluid collection. No acute cortical infarction. No mass lesion is noted on this unenhanced scan. Mild mucosal thickening with partial opacification bilateral ethmoid air cells.  CT CERVICAL SPINE FINDINGS  Axial images of the cervical spine shows no acute fracture or subluxation. Computer processed images shows no acute fracture or subluxation. Alignment, disc spaces and vertebral heights are preserved.  There is no pneumothorax in visualized lung apices.  IMPRESSION: 1. No acute intracranial abnormality. 2. Mild mucosal thickening with air-fluid level bilateral maxillary sinus. Mild mucosal thickening with partial opacification bilateral ethmoid air cells. 3. No cervical spine acute fracture or subluxation. No prevertebral soft tissue swelling. Cervical airway is patent.   Electronically Signed   By: Lahoma Crocker M.D.   On: 04/20/2014 13:46   Mr Thoracic Spine Wo Contrast  04/20/2014   CLINICAL DATA:  38 year old female found down, lower extremity weakness and urinary retention of unknown etiology. Initial encounter.  EXAM: MRI THORACIC AND LUMBAR SPINE WITHOUT CONTRAST  TECHNIQUE: Multiplanar and multiecho pulse sequences of the thoracic and lumbar spine were obtained without intravenous contrast.  COMPARISON:  Lumbar MRI 07/30/2004. CT Chest, Abdomen and Pelvis 04/20/2014.  FINDINGS: MR THORACIC SPINE FINDINGS  Limited sagittal imaging of the cervical spine is unremarkable.  Large body habitus. Normal thoracic vertebral height and alignment. No marrow edema  or evidence of acute osseous abnormality.  Normal thoracic spinal canal patency. Spinal cord signal is within normal limits at all visualized levels. Conus medullaris described with lumbar findings.  No thoracic disc herniation. There is mild to moderate thoracic facet hypertrophy from T7-T8 T11-T12. Subsequent mild right T9 and T10 neural foraminal stenosis.  Abnormal signal in the  lung apices, see recent chest CT. Visualized posterior paraspinal soft tissues are within normal limits. Negative visualized upper abdominal viscera.  MR LUMBAR SPINE FINDINGS  Normal lumbar segmentation, congruent with the thoracic numbering system.  Visualized lower thoracic spinal cord is normal with conus medularis at L1-L2.  Stable and normal lumbar vertebral height and alignment. No marrow edema or evidence of acute osseous abnormality.  Confluent abnormal signal and swelling of the bilateral erector spinae muscles from the L3 level to the sacrum, left greater than right. No associated fluid collection. No edema in knee adjacent posterior elements. There is a comparatively small volume of overlying subcutaneous fat edema.  Furthermore, coronal and oblique coronal imaging was performed demonstrating confluent Severe bilateral muscle edema also in the visible gluteus (series 19, image 20) and other deep adductor muscles (piriformis right greater than left). No fluid collection identified within the visible affected areas.  No lumbar disc degeneration or stenosis at the L3-L4 level and above.  L4-L5: Disc desiccation and circumferential disc bulge. Moderate facet hypertrophy. Small central annular fissure. No significant stenosis.  L5-S1: Disc desiccation with small right paracentral disc protrusion. Mild facet hypertrophy. Epidural lipomatosis. Conjoined right L5-S1 nerve roots such that there is moderate right lateral recess stenosis affecting the right S1 roots. No other stenosis.  IMPRESSION: MR LUMBAR SPINE IMPRESSION  1.  Confluent severe muscle edema about the lower pelvis, affecting gluteal muscles and adductors. Separate but similar involvement of the lower lumbar erector spinae muscles. Considering the patient was found down, this may reflect severe myositis in the setting of generalized rhabdomyolysis. Infectious myositis is the main differential consideration. 2. No lumbar spinal stenosis. Mild lower lumbar degenerative changes only affecting the right S1 nerve roots in the lateral recess.  MR THORACIC SPINE IMPRESSION  1. Normal thoracic spine and spinal cord, aside from lower thoracic facet degeneration. 2. Abnormal signal in the upper lungs, see chest CT from earlier today.   Electronically Signed   By: Lars Pinks M.D.   On: 04/20/2014 17:31   Mr Lumbar Spine Wo Contrast  04/20/2014   CLINICAL DATA:  39 year old female found down, lower extremity weakness and urinary retention of unknown etiology. Initial encounter.  EXAM: MRI THORACIC AND LUMBAR SPINE WITHOUT CONTRAST  TECHNIQUE: Multiplanar and multiecho pulse sequences of the thoracic and lumbar spine were obtained without intravenous contrast.  COMPARISON:  Lumbar MRI 07/30/2004. CT Chest, Abdomen and Pelvis 04/20/2014.  FINDINGS: MR THORACIC SPINE FINDINGS  Limited sagittal imaging of the cervical spine is unremarkable.  Large body habitus. Normal thoracic vertebral height and alignment. No marrow edema or evidence of acute osseous abnormality.  Normal thoracic spinal canal patency. Spinal cord signal is within normal limits at all visualized levels. Conus medullaris described with lumbar findings.  No thoracic disc herniation. There is mild to moderate thoracic facet hypertrophy from T7-T8 T11-T12. Subsequent mild right T9 and T10 neural foraminal stenosis.  Abnormal signal in the lung apices, see recent chest CT. Visualized posterior paraspinal soft tissues are within normal limits. Negative visualized upper abdominal viscera.  MR LUMBAR SPINE FINDINGS  Normal  lumbar segmentation, congruent with the thoracic numbering system.  Visualized lower thoracic spinal cord is normal with conus medularis at L1-L2.  Stable and normal lumbar vertebral height and alignment. No marrow edema or evidence of acute osseous abnormality.  Confluent abnormal signal and swelling of the bilateral erector spinae muscles from the L3 level to the sacrum, left greater than right. No associated fluid collection. No edema  in knee adjacent posterior elements. There is a comparatively small volume of overlying subcutaneous fat edema.  Furthermore, coronal and oblique coronal imaging was performed demonstrating confluent Severe bilateral muscle edema also in the visible gluteus (series 19, image 20) and other deep adductor muscles (piriformis right greater than left). No fluid collection identified within the visible affected areas.  No lumbar disc degeneration or stenosis at the L3-L4 level and above.  L4-L5: Disc desiccation and circumferential disc bulge. Moderate facet hypertrophy. Small central annular fissure. No significant stenosis.  L5-S1: Disc desiccation with small right paracentral disc protrusion. Mild facet hypertrophy. Epidural lipomatosis. Conjoined right L5-S1 nerve roots such that there is moderate right lateral recess stenosis affecting the right S1 roots. No other stenosis.  IMPRESSION: MR LUMBAR SPINE IMPRESSION  1. Confluent severe muscle edema about the lower pelvis, affecting gluteal muscles and adductors. Separate but similar involvement of the lower lumbar erector spinae muscles. Considering the patient was found down, this may reflect severe myositis in the setting of generalized rhabdomyolysis. Infectious myositis is the main differential consideration. 2. No lumbar spinal stenosis. Mild lower lumbar degenerative changes only affecting the right S1 nerve roots in the lateral recess.  MR THORACIC SPINE IMPRESSION  1. Normal thoracic spine and spinal cord, aside from lower  thoracic facet degeneration. 2. Abnormal signal in the upper lungs, see chest CT from earlier today.   Electronically Signed   By: Lars Pinks M.D.   On: 04/20/2014 17:31   Ct Abdomen Pelvis W Contrast  04/20/2014   CLINICAL DATA:  Possible trauma.  Altered mental status.  EXAM: CT CHEST, ABDOMEN, AND PELVIS WITH CONTRAST  TECHNIQUE: Multidetector CT imaging of the chest, abdomen and pelvis was performed following the standard protocol during bolus administration of intravenous contrast.  CONTRAST:  141mL OMNIPAQUE IOHEXOL 300 MG/ML  SOLN  COMPARISON:  Chest radiograph earlier today  FINDINGS: CT CHEST FINDINGS  There is no evidence of great vessel injury. 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 The heart is normal in size. No enlarged axillary, mediastinal, or hilar lymph nodes are seen. There is no pleural or pericardial effusion. There is no pneumothorax. Ground-glass opacities are present in both upper lobes. Minimal subpleural opacity dependently in the lower lobes likely represents atelectasis. No acute osseous abnormality is identified in the chest.  CT ABDOMEN AND PELVIS FINDINGS  The gallbladder is surgically absent. The liver, spleen, adrenal glands, and pancreas have an unremarkable enhanced appearance. Hypodense renal lesions measure 1.2 cm in the right upper pole and 8 mm in the interpolar region on the left.  A Foley catheter is present in the bladder, which is decompressed. Uterus and ovaries are grossly unremarkable. The bowel is grossly unremarkable. No free fluid or enlarged lymph nodes are identified. Abdominal aorta and its major branch vessels are patent. No acute osseous abnormality is identified. Mild facet arthrosis is noted in the lower lumbar spine.  IMPRESSION: 1. Bilateral upper lobe ground-glass opacities, concerning for multifocal infection or possibly contusion given the possibility of trauma. 2. No acute abnormality identified in the abdomen or pelvis. 3.  Small bilateral hypodense renal lesions, possibly cysts but indeterminate on this study. Consider further evaluation with nonemergent abdominal MRI after the patient's acute illness has resolved.   Electronically Signed   By: Logan Bores   On: 04/20/2014 13:58   Dg Pelvis Portable  04/20/2014   CLINICAL DATA:  found down  EXAM: PORTABLE PELVIS 1-2 VIEWS  COMPARISON:  03/17/2019  2006  FINDINGS: There is no evidence of pelvic fracture or diastasis. No other pelvic bone lesions are seen. Bilateral pelvic calcifications which were not present on the prior film.  IMPRESSION: 1. Negative for fracture. 2. New bilateral pelvic calcifications.   Electronically Signed   By: Arne Cleveland M.D.   On: 04/20/2014 10:50   Dg Chest Port 1 View  04/22/2014   CLINICAL DATA:  Assess edema  EXAM: PORTABLE CHEST - 1 VIEW  COMPARISON:  04/21/2014  FINDINGS: Heart size and pulmonary vascularity are within normal limits.  The right lung remains clear. Question mild left lower lobe atelectasis/infiltrate and small left effusion. Slight increase in left lower lobe opacity compared with yesterday.  IMPRESSION: Question early infiltrate or atelectasis in the left lower lobe.   Electronically Signed   By: Franchot Gallo M.D.   On: 04/22/2014 07:20   Dg Chest Port 1 View  04/21/2014   CLINICAL DATA:  Seizure.  Tachycardia.  EXAM: PORTABLE CHEST - 1 VIEW  COMPARISON:  CT and single view of the chest 04/12/2014.  FINDINGS: Bilateral upper lobe airspace disease seen on the comparison studies is markedly improved. The lungs now appear clear. Heart size is normal. No pneumothorax or pleural effusion.  IMPRESSION: No acute finding. Bilateral upper lobe airspace disease has resolved.   Electronically Signed   By: Inge Rise M.D.   On: 04/21/2014 07:40   Dg Chest Portable 1 View  04/20/2014   CLINICAL DATA:  found down  EXAM: PORTABLE CHEST - 1 VIEW  COMPARISON:  03/16/2014  FINDINGS: New interstitial and airspace opacities in both  upper lobes. Lung bases remain clear. Heart size upper limits normal for technique. No effusion. Visualized skeletal structures are unremarkable.  IMPRESSION: 1. Bilateral upper lobe infiltrates, new since prior study.   Electronically Signed   By: Arne Cleveland M.D.   On: 04/20/2014 10:45    Microbiology: Recent Results (from the past 240 hour(s))  CULTURE, BLOOD (ROUTINE X 2)     Status: None   Collection Time    04/20/14 10:54 AM      Result Value Ref Range Status   Specimen Description BLOOD LEFT WRIST   Final   Special Requests BOTTLES DRAWN AEROBIC AND ANAEROBIC 5CC   Final   Culture  Setup Time     Final   Value: 04/20/2014 22:49     Performed at [facility]   Culture     Final   Value: NO GROWTH 5 DAYS     Performed at [facility]   Report Status 04/26/2014 FINAL   Final  CULTURE, BLOOD (ROUTINE X 2)     Status: None   Collection Time    04/20/14 10:59 AM      Result Value Ref Range Status   Specimen Description BLOOD RIGHT HAND   Final   Special Requests BOTTLES DRAWN AEROBIC ONLY 10CC   Final   Culture  Setup Time     Final   Value: 04/20/2014 22:49     Performed at [facility]   Culture     Final   Value: NO GROWTH 5 DAYS     Performed at [facility]   Report Status 04/26/2014 FINAL   Final  MRSA PCR SCREENING     Status: Abnormal   Collection Time    04/20/14  6:01 PM      Result Value Ref Range Status   MRSA by PCR POSITIVE (*) NEGATIVE  Final   Comment:            The GeneXpert MRSA Assay (FDA     approved for NASAL specimens     only), is one component of a     comprehensive MRSA colonization     surveillance program. It is not     intended to diagnose MRSA     infection nor to guide or     monitor treatment for     MRSA infections.     RESULT CALLED TO, READ BACK BY AND VERIFIED WITH:     ROYAL,K RN 2010 04/20/14 MITCHELL,L     Labs: Basic Metabolic Panel:  Recent Labs Lab 04/22/14 0555 04/23/14 0550  04/24/14 0450 04/26/14 0454  NA 140 136* 137 139  K 3.9 3.8 3.9 4.3  CL 106 100 102 102  CO2 19 22 23 27   GLUCOSE 125* 167* 244* 166*  BUN 4* 4* 7 11  CREATININE 0.43* 0.44* 0.47* 0.58  CALCIUM 7.7* 8.1* 8.4 8.7  MG 1.8 1.6 1.6  --   PHOS 2.2* 3.3 4.2  --    Liver Function Tests:  Recent Labs Lab 04/22/14 0555  AST 160*  ALT 86*  ALKPHOS 84  BILITOT 0.4  PROT 5.3*  ALBUMIN 1.7*   No results found for this basename: LIPASE, AMYLASE,  in the last 168 hours No results found for this basename: AMMONIA,  in the last 168 hours CBC:  Recent Labs Lab 04/22/14 0555 04/26/14 0454  WBC 9.4 5.1  NEUTROABS 6.6  --   HGB 13.6 11.9*  HCT 40.9 35.3*  MCV 95.6 92.7  PLT 228 311   Cardiac Enzymes:  Recent Labs Lab 04/24/14 0450 04/25/14 0500 04/26/14 0454 04/27/14 0400 04/28/14 0424  CKTOTAL 2293* 1208* 817* 606* 490*   BNP: BNP (last 3 results)  Recent Labs  04/20/14 1850 04/21/14 0641  PROBNP 4466.0* 4365.0*   CBG:  Recent Labs Lab 04/27/14 0823 04/27/14 1139 04/27/14 1727 04/27/14 2054 04/28/14 0733  GLUCAP 128* 159* 227* 203* 182*       Signed:  Gosper  [facility] 04/28/2014, 11:50 AM

## 2014-04-29 ENCOUNTER — Inpatient Hospital Stay (HOSPITAL_COMMUNITY): Payer: Medicaid Other

## 2014-04-29 ENCOUNTER — Inpatient Hospital Stay (HOSPITAL_COMMUNITY): Payer: Medicaid Other | Admitting: Occupational Therapy

## 2014-04-29 ENCOUNTER — Inpatient Hospital Stay (HOSPITAL_COMMUNITY): Payer: Self-pay | Admitting: Occupational Therapy

## 2014-04-29 LAB — URINE MICROSCOPIC-ADD ON

## 2014-04-29 LAB — GLUCOSE, CAPILLARY
Glucose-Capillary: 212 mg/dL — ABNORMAL HIGH (ref 70–99)
Glucose-Capillary: 222 mg/dL — ABNORMAL HIGH (ref 70–99)
Glucose-Capillary: 251 mg/dL — ABNORMAL HIGH (ref 70–99)
Glucose-Capillary: 212 mg/dL — ABNORMAL HIGH (ref 70–99)

## 2014-04-29 LAB — CBC WITH DIFFERENTIAL/PLATELET
Basophils Absolute: 0.1 10*3/uL (ref 0.0–0.1)
Basophils Relative: 1 % (ref 0–1)
Eosinophils Absolute: 0.3 10*3/uL (ref 0.0–0.7)
Eosinophils Relative: 3 % (ref 0–5)
HCT: 39.9 % (ref 36.0–46.0)
Hemoglobin: 13.6 g/dL (ref 12.0–15.0)
Lymphocytes Relative: 27 % (ref 12–46)
Lymphs Abs: 2.3 10*3/uL (ref 0.7–4.0)
MCH: 31.9 pg (ref 26.0–34.0)
MCHC: 34.1 g/dL (ref 30.0–36.0)
MCV: 93.4 fL (ref 78.0–100.0)
Monocytes Absolute: 0.9 10*3/uL (ref 0.1–1.0)
Monocytes Relative: 11 % (ref 3–12)
Neutro Abs: 4.8 10*3/uL (ref 1.7–7.7)
Neutrophils Relative %: 58 % (ref 43–77)
Platelets: 384 10*3/uL (ref 150–400)
RBC: 4.27 MIL/uL (ref 3.87–5.11)
RDW: 13.3 % (ref 11.5–15.5)
WBC: 8.4 10*3/uL (ref 4.0–10.5)

## 2014-04-29 LAB — COMPREHENSIVE METABOLIC PANEL
ALT: 53 U/L — ABNORMAL HIGH (ref 0–35)
AST: 43 U/L — ABNORMAL HIGH (ref 0–37)
Albumin: 2 g/dL — ABNORMAL LOW (ref 3.5–5.2)
Alkaline Phosphatase: 82 U/L (ref 39–117)
Anion gap: 11 (ref 5–15)
BUN: 14 mg/dL (ref 6–23)
CO2: 24 mEq/L (ref 19–32)
Calcium: 8.5 mg/dL (ref 8.4–10.5)
Chloride: 98 mEq/L (ref 96–112)
Creatinine, Ser: 0.63 mg/dL (ref 0.50–1.10)
GFR calc Af Amer: >90 mL/min (ref >90)
GFR calc non Af Amer: >90 mL/min (ref >90)
Glucose, Bld: 246 mg/dL — ABNORMAL HIGH (ref 70–99)
Potassium: 4.8 mEq/L (ref 3.7–5.3)
Sodium: 133 mEq/L — ABNORMAL LOW (ref 137–147)
Total Bilirubin: 0.3 mg/dL (ref 0.3–1.2)
Total Protein: 5.5 g/dL — ABNORMAL LOW (ref 6.0–8.3)

## 2014-04-29 LAB — URINALYSIS, ROUTINE W REFLEX MICROSCOPIC
Bilirubin Urine: NEGATIVE
Glucose, UA: 100 mg/dL — AB
Hgb urine dipstick: NEGATIVE
Ketones, ur: NEGATIVE mg/dL
Nitrite: NEGATIVE
Protein, ur: NEGATIVE mg/dL
Specific Gravity, Urine: 1.013 (ref 1.005–1.030)
Urobilinogen, UA: 0.2 mg/dL (ref 0.0–1.0)
pH: 6.5 (ref 5.0–8.0)

## 2014-04-29 LAB — CK: Total CK: 365 U/L — ABNORMAL HIGH (ref 7–177)

## 2014-04-29 MED ORDER — FLUCONAZOLE 100 MG PO TABS
100.0000 mg | ORAL_TABLET | Freq: Every day | ORAL | Status: AC
  Administered 2014-04-29 – 2014-05-03 (×5): 100 mg via ORAL
  Filled 2014-04-29 (×5): qty 1

## 2014-04-29 NOTE — Progress Notes (Signed)
[place] PHYSICAL MEDICINE & REHABILITATION     PROGRESS NOTE    Subjective/Complaints: Had a reasonably good night. Pain under fair control. Ready for therapies.   Objective: Vital Signs: Blood pressure 121/79, pulse 113, temperature 97.9 F (36.6 C), temperature source Oral, resp. rate 18, weight 115.985 kg (255 lb 11.2 oz), SpO2 96.00%. No results found.  Recent Labs  04/29/14 0830  WBC PENDING  HGB 13.6  HCT 39.9  PLT 384   No results found for this basename: NA, K, CL, CO, GLUCOSE, BUN, CREATININE, CALCIUM,  in the last 72 hours CBG (last 3)   Recent Labs  04/28/14 1635 04/28/14 2116 04/29/14 0726  GLUCAP 256* 208* 212*    Wt Readings from Last 3 Encounters:  04/29/14 115.985 kg (255 lb 11.2 oz)  04/28/14 114.579 kg (252 lb 9.6 oz)    Physical Exam:  Constitutional: She is oriented to person, place, and time. She appears well-developed and well-nourished.    HENT:  Head: Normocephalic and atraumatic.  Eyes: Conjunctivae are normal. Pupils are equal, round, and reactive to light.  Neck: Normal range of motion. Neck supple.  Cardiovascular: Normal rate and regular rhythm.  No murmur heard.  Respiratory: Effort normal and breath sounds normal. No respiratory distress. She has no wheezes.  GI: Soft. Bowel sounds are normal.  Musculoskeletal:  Denies any pain with attempts at ROM bilateral hips, knees and feet. 2+ pitting edema BLE from hips to feet-- proximal > distal. Evidence of healing/drying folliculitis RLE. Vesicular rash Neurological: She is alert and oriented to person, place, and time.  Follows commands without difficulty. 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  DTR's trace to absent in LE's  Psych: pt pleasant and generally cooperative.   Assessment/Plan: 1. Functional deficits secondary to rhabdomyolysis with  secondary polyneuropathy which require 3+ hours per day of interdisciplinary therapy in a comprehensive inpatient rehab setting. Physiatrist is providing close team supervision and 24 hour management of active medical problems listed below. Physiatrist and rehab team continue to assess barriers to discharge/monitor patient progress toward functional and medical goals. FIM: FIM - Bathing Bathing: 0: Activity did not occur  FIM - Upper Body Dressing/Undressing Upper body dressing/undressing steps patient completed: Thread/unthread right sleeve of pullover shirt/dresss;Thread/unthread left sleeve of pullover shirt/dress;Put head through opening of pull over shirt/dress;Pull shirt over trunk Upper body dressing/undressing: 4: Steadying assist FIM - Lower Body Dressing/Undressing Lower body dressing/undressing: 1: Total-Patient completed less than 25% of tasks  FIM - Toileting Toileting: 0: Activity did not occur  FIM - Air cabin crew Transfers: 0-Activity did not occur  FIM - IT sales professional Transfer: 4: Supine > Sit: Min A (steadying Pt. > 75%/lift 1 leg);4: Bed > Chair or W/C: Min A (steadying Pt. > 75%)     Comprehension Comprehension Mode: Auditory Comprehension: 6-Follows complex conversation/direction: With extra time/assistive device  Expression Expression Mode: Verbal Expression: 6-Expresses complex ideas: With extra time/assistive device  Social Interaction Social Interaction: 6-Interacts appropriately with others with medication or extra time (anti-anxiety, antidepressant).  Problem Solving Problem Solving: 5-Solves basic problems: With no assist  Memory Memory: 6-More than reasonable amt of time  Medical Problem List and Plan:  1. 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 DVT Prophylaxis/Anticoagulation: Pharmaceutical: Lovenox  3. Pain Management: Will discontinue  naprosyn with history of rhabdomyolysis. Continue neurontin 300 mg tid as well as elavil 100 mg at bedtime. Monitor for SE of medications.  4. Mood: No signs of depression or anxiety noted. LCSW to follow for evaluation and support. Medications compared with care everywhere notes ( tegretol 500 mg tid, Trazodone 100 Mg/HS prn, cogentin 1 mg tid and haldol 5mg  tid) and patient's input conflicts with records. Mother advised to bring medication in. Will get records release form to get formation from [facility] also. Continue current klonopin, Seroquel and haldol.   -mood appropriate at present 5. Neuropsych: This patient is capable of making decisions on her own behalf.  6. DM type 2--poorly controlled: Hgb A1c-10.4. She reports that she used novolog tid ac and levemir 60 units at home.   -increased levemir to 20 units bid/ titrate further as indicated.  7. Urinary incontinence: UA with yeast and wbc's---begin diflucan . - Monitor voiding with PVR c  Cath for volumes > [id number] cc.  8. Folliculitis: will change Unasyn to keflex for 7 total day treatment.  9. Renal cysts: Will need follow up with PMD on outpatient basis for workup.  10. Peripheral edema: Rule out DVT as cause. Likely due to compression and paraparesis. Encourage elevation. TEDs. May need diuretics additionally to help with symptoms.  LOS (Days) 1 A FACE TO FACE EVALUATION WAS PERFORMED  Wilfrid Hyser T 04/29/2014 9:17 AM

## 2014-04-29 NOTE — Progress Notes (Signed)
Inpatient Diabetes Program Recommendations  AACE/ADA: New Consensus Statement on Inpatient Glycemic Control (2013)  Target Ranges:  Prepandial:   less than 140 mg/dL      Peak postprandial:   less than 180 mg/dL (1-2 hours)      Critically ill patients:  140 - 180 mg/dL   Reason for Visit: Results for SHAASIA, MEIDL (MRN [id number]) as of 04/29/2014 13:17  Ref. Range 04/28/2014 12:04 04/28/2014 16:35 04/28/2014 21:16 04/29/2014 07:26 04/29/2014 11:57  Glucose-Capillary Latest Range: 70-99 mg/dL 223 (H) 256 (H) 208 (H) 212 (H) 222 (H)   Diabetes history:  Type 2 diabetes  Current orders for Inpatient glycemic control: Levemir 20 units bid, Novolog sensitive tid with meals and HS  Consider adding Novolog meal coverage 6 units tid with meals (to be held if patient eats less than 50%).  Thanks, Adah Perl, RN, BC-ADM Inpatient Diabetes Coordinator Pager [phone]

## 2014-04-29 NOTE — Evaluation (Signed)
Physical Therapy Assessment and Plan  Patient Details  Name: [name] MRN: [id number] Date of Birth: November 30, 1975  PT Diagnosis: Abnormal posture, Abnormality of gait, Difficulty walking, Edema, Impaired sensation and Pain in joint Rehab Potential: Good ELOS: 14-18 days   Today's Date: 04/29/2014 Time: 6195-0932 Time Calculation (min): 60 min  Problem List:  Patient Active Problem List   Diagnosis Date Noted  . 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 Cocaine abuse 04/23/2014  . Bipolar disorder, unspecified 04/23/2014  . Morbid obesity 04/23/2014  . DM type 2 (diabetes mellitus, type 2)- poorly controlled 04/23/2014  . Toxic encephalopathy 04/20/2014  . 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 Lactic acid acidosis 04/20/2014  . Paraparesis of both lower limbs 04/20/2014  . HCAP (healthcare-associated pneumonia) 04/20/2014    Past Medical History:  Past Medical History  Diagnosis Date  . Diabetes mellitus without complication   . Hypertension   . Cocaine abuse   . Seizures   . Chronic combined systolic and diastolic CHF (congestive heart failure)     a. ECHO 04/23/14 LV EF: 45% - 50%,  mild LVH, diffuse hypokinesis. LV diastolic dysfunction, high ventricular filling pressure. Mild LA dilation.   Past Surgical History:  Past Surgical History  Procedure Laterality Date  . Cholecystectomy  2005    Assessment & Plan Clinical Impression: [name] is a 38 y.o. female smoker, with h/o DM with peripheral neuropathy, HTN, seizures, suicidal ideation--most recent 03/16/14, polysubstance abuse, multiple admissions for substance abuse as well as involuntary commitment; who was admitted on 6/28 after being found down in yard with decreased MS and inability to move legs. She was found to have elevated CK, elevated glucose, lactic acid, severely distended bladder as well as CXR concerning for aspiration. UDS positive for cocaine and patient without memory  of events. Foley placed with 2.5 L from bladder and she was treated with IVF for severe rhabdomyolysis as well as IV antibiotics of probable sepsis form aspiration PNA. CT Head/Cervical spine/and abdomen without acute abnormality. CT chest with bilateral upper lobe ground glass opacities. MRI toracic spine negative and lumbar spine films revealed confluent severe muscle edema in lower pelvis affecting gluteal muscle, adductors and lower lumbar erector spinae muscles--likely due to severe myositis in setting of Rhabdo.   Neurology consulted for input and felt that patient's symptoms likely due to an axonal sensory-motor neuropathy as well as a lumbosacral plexopathy due to severe compressive myositis in the setting of generalized rhabdomyolysis. NCS/EMG recommended for follow up but unavailable in hospital setting. Dr. Cathie Olden consulted for input on global hypokinesis and felt that EF c/w cocaine use and recommended follow up on outpatient basis as well as stopping use of cocaine, ETOH and tobacco. Patient was treated with IV toradol and placed on naprosyn as well as tramadol for pain management. She was started on neurontin for neuropathic symptoms. She developed folliculitis RLE and was placed on Unasyn this weekend with improvement. Foley discontinued and patient with incontinence. She has had improvement in motor strength as well as activity level. CIR was recommended by rehab team and patient admitted today.    Patient currently requires mod with mobility secondary to muscle weakness and decreased sitting balance, decreased standing balance, decreased postural control and pain.  Prior to hospitalization, patient was independent  with mobility and lived with Alone in a Mobile home home.  Home access is 2 steps in back; family building ramp in back (front with 5 steps)Stairs to enter.  Patient will benefit from skilled PT intervention to maximize safe functional mobility, minimize fall risk and decrease  caregiver burden for planned discharge home with 24 hour supervision.  Anticipate patient will benefit from follow up [place] at discharge.  PT - End of Session Endurance Deficit: Yes PT Assessment Rehab Potential: Good Barriers to Discharge: Inaccessible home environment PT Patient demonstrates impairments in the following area(s): Balance;Edema;Endurance;Motor;Pain;Sensory PT Transfers Functional Problem(s): Bed Mobility;Bed to Chair;Car;Furniture;Floor PT Locomotion Functional Problem(s): Ambulation;Wheelchair Mobility;Stairs PT Plan PT Intensity: Minimum of 1-2 x/day ,45 to 90 minutes PT Frequency: 5 out of 7 days PT Duration Estimated Length of Stay: 14-18 days PT Treatment/Interventions: Ambulation/gait training;Balance/vestibular training;Discharge planning;DME/adaptive equipment instruction;Functional mobility training;Neuromuscular re-education;Pain management;Patient/family education;Therapeutic Activities;Therapeutic Exercise;UE/LE Strength taining/ROM;Wheelchair propulsion/positioning PT Transfers Anticipated Outcome(s): supervision PT Locomotion Anticipated Outcome(s): mod (I) from w/c level, supervision for short ambulation PT Recommendation Recommendations for Other Services: Neuropsych consult Follow Up Recommendations: Home health PT Patient destination: Home Equipment Recommended: Wheelchair (measurements);To be determined  Skilled Therapeutic Intervention   PT Evaluation Precautions/Restrictions Precautions Precautions: Fall Restrictions Weight Bearing Restrictions: No General Chart Reviewed: Yes Vital SignsTherapy Vitals Pulse Rate: 122 Oxygen Therapy SpO2: 98 % O2 Device: None (Room air) Pain Pain Assessment Pain Assessment: 0-10 Pain Score: 6  Pain Type: Acute pain Pain Location: Hip Pain Orientation: Right;Left;Anterior;Posterior Pain Descriptors / Indicators: Sore Pain Onset: On-going Patients Stated Pain Goal: 0 Pain Intervention(s):  (Pt  pre-medicated) Home Living/Prior Functioning Home Living Available Help at Discharge: Family;Available 24 hours/day;Other (Comment) (Mom and cousin will be available at d/c) Type of Home: Mobile home Home Access: Stairs to enter Entrance Stairs-Number of Steps: 2 steps in back; family building ramp in back (front with 5 steps) Entrance Stairs-Rails: None Home Layout: One level Additional Comments: pt states she doubts a wheelchair can get into her bathroom or bedroom. Would have to set up bed in her den  Lives With: Alone Prior Function Level of Independence: Independent with basic ADLs;Independent with homemaking with ambulation Vision/Perception     Cognition Overall Cognitive Status: Within Functional Limits for tasks assessed Arousal/Alertness: Lethargic Orientation Level: Oriented X4 Memory: Appears intact Awareness: Appears intact Problem Solving: Appears intact Behaviors:  (Flat affect) Safety/Judgment: Appears intact Sensation Sensation Light Touch: Impaired Detail Light Touch Impaired Details: Impaired RLE;Impaired LLE (RLE: Absent below knee. LLE: Absent on top of foot) Motor  Motor Motor: Within Functional Limits Motor - Skilled Clinical Observations: Weak/painful w/ all LE movements  Mobility Bed Mobility Bed Mobility: Rolling Right;Rolling Left;Scooting to [facility] Rolling Right: 3: Mod assist Rolling Right Details (indicate cue type and reason): Assist w/ LE management Rolling Left: 3: Mod assist Rolling Left Details (indicate cue type and reason): Assist w/ LE management Scooting to HOB: 2: Max assist Transfers Transfers: Yes Sit to Stand: 1: +2 Total assist Squat Pivot Transfers: 2: Max assist Lateral/Scoot Transfers: 3: Mod assist;4: Min assist Lateral/Scoot Transfer Details:  (sliding board) Lateral/Scoot Transfer Details (indicate cue type and reason): MinA to L, ModA to R. VC's for head/hips relationship and weight shifting Locomotion   Ambulation Ambulation: No Gait Gait: No Wheelchair Mobility Wheelchair Mobility: Yes Wheelchair Assistance: 4: Min Tour manager: Both upper extremities Wheelchair Parts Management: Supervision/cueing Distance: 120  Trunk/Postural Assessment  Cervical Assessment Cervical Assessment: Within Scientist, physiological Assessment: Within Ambulance person Balance Assessed: Yes Static Sitting Balance Static Sitting - Balance Support: Bilateral upper extremity supported Static Sitting - Level of Assistance: 5: Stand by assistance Dynamic Sitting Balance Dynamic Sitting -  Balance Support: Bilateral upper extremity supported Dynamic Sitting - Level of Assistance: 4: Min assist Extremity Assessment  RUE Assessment RUE Assessment: Within Functional Limits LUE Assessment LUE Assessment: Within Functional Limits RLE Assessment RLE Assessment: Exceptions to [name] RLE Strength Right Hip Flexion: 2+/5 Right Knee Flexion: 2/5 Right Knee Extension: 4/5 Right Ankle Dorsiflexion: 2/5 Right Ankle Plantar Flexion: 3+/5 LLE Assessment LLE Assessment: Exceptions to [facility] LLE Strength Left Hip Flexion: 3+/5 Left Knee Flexion: 3+/5 Left Knee Extension: 4/5 Left Ankle Dorsiflexion: 1/5 Left Ankle Plantar Flexion: 3+/5  FIM:  FIM - Control and instrumentation engineer Devices: Sliding board;Arm rests Bed/Chair Transfer: 5: Supine > Sit: Supervision (verbal cues/safety issues);3: Bed > Chair or W/C: Mod A (lift or lower assist);3: Chair or W/C > Bed: Mod A (lift or lower assist) FIM - Locomotion: Wheelchair Distance: 120 Locomotion: Wheelchair: 4: Travels 150 ft or more: maneuvers on rugs and over door sillls with minimal assistance (Pt.>75%) FIM - Locomotion: Ambulation Locomotion: Ambulation: 0: Activity did not occur FIM - Locomotion: Stairs Locomotion: Stairs: 0: Activity did not occur   Refer to Care Plan for Long Term  Goals  Recommendations for other services: Neuropsych  Discharge Criteria: Patient will be discharged from PT if patient refuses treatment 3 consecutive times without medical reason, if treatment goals not met, if there is a change in medical status, if patient makes no progress towards goals or if patient is discharged from hospital.  The above assessment, treatment plan, treatment alternatives and goals were discussed and mutually agreed upon: by patient  Rada Hay 04/29/2014, 2:39 PM

## 2014-04-29 NOTE — Progress Notes (Signed)
Patient information reviewed and entered into eRehab system by Mikaele Stecher, RN, CRRN, PPS Coordinator.  Information including medical coding and functional independence measure will be reviewed and updated through discharge.    

## 2014-04-29 NOTE — Progress Notes (Signed)
Physical Therapy Session Note  Patient Details  Name: [name] MRN: [id number] Date of Birth: 03/20/1976  Today's Date: 04/29/2014 Time: [id number] Time Calculation (min): 42 min  Short Term Goals: Week 1:  PT Short Term Goal 1 (Week 1): Pt to perform sliding board transfers w/ MinA consistently PT Short Term Goal 2 (Week 1): Pt to perform sit to stand w/ ModA PT Short Term Goal 3 (Week 1): Pt to tolerate upright standing w/ pain <6/10 PT Short Term Goal 4 (Week 1): Pt will manage w/c parts w/ supervision PT Short Term Goal 5 (Week 1): Pt to perform bed mobility w/ MinA  Skilled Therapeutic Interventions/Progress Updates:   Pt. Seated on mat table in gym with mother and cousin; agreeable to therapy.  Therapeutic Activities: UB positioning/awareness for proper sequencing of lateral scooting for increased bed mobility and functional transfers. Pt. Independent with BUE use. BLE positioning for sequencing of weight shift and improved activation of RLE hip flexion and hip adbduction. Pt. Required mod-max A for RLE and min A for LLE positioning. Transfers: squat-pivot x 2 mod-max A for completion and safety. Pt. Sit>supine (I) with bed flat; bed mobility independent with increased UB use by pt.  Pt. Reports 6/10 pain with no changes during therapy. Pt. Describes R hip flexor related pain/spasm with increased activity; diminishes with repositioning and relaxation.  Pt. Supine in bed with all needs within reach. RN present and family members; pt. To have LE doppler for suspected DVT per RN.  Therapy Documentation Precautions:  Precautions Precautions: Fall Restrictions Weight Bearing Restrictions: No  Pain: Pain Assessment Pain Assessment: Faces Pain Score: 6  Pain Type: Acute pain Pain Location: Foot Pain Orientation: Right;Left Pain Descriptors / Indicators: Sore Pain Onset: On-going Patients Stated Pain Goal: 0 Pain Intervention(s): Repositioned;Ambulation/increased  activity  Locomotion : Ambulation Ambulation: No Gait Gait: No Wheelchair Mobility Wheelchair Mobility: Yes Wheelchair Assistance: 4: Min Tour manager: Both upper extremities Wheelchair Parts Management: Supervision/cueing Distance: 100   See FIM for current functional status  Therapy/Group: Individual Therapy  Juluis Mire 04/29/2014, 3:54 PM

## 2014-04-29 NOTE — Evaluation (Signed)
Occupational Therapy Assessment and Plan & Session Note  Patient Details  Name: [name] MRN: [id number] Date of Birth: Mar 18, 1976  OT Diagnosis: acute pain, muscle weakness (generalized) and edema Rehab Potential: Rehab Potential: Excellent ELOS: 14-18 days   Today's Date: 04/29/2014 Time: 0730-0830 Time Calculation (min): 60 min  Problem List:  Patient Active Problem List   Diagnosis Date Noted  . 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 Cocaine abuse 04/23/2014  . Bipolar disorder, unspecified 04/23/2014  . Morbid obesity 04/23/2014  . DM type 2 (diabetes mellitus, type 2)- poorly controlled 04/23/2014  . Toxic encephalopathy 04/20/2014  . 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 Lactic acid acidosis 04/20/2014  . Paraparesis of both lower limbs 04/20/2014  . HCAP (healthcare-associated pneumonia) 04/20/2014    Past Medical History:  Past Medical History  Diagnosis Date  . Diabetes mellitus without complication   . Hypertension   . Cocaine abuse   . Seizures   . Chronic combined systolic and diastolic CHF (congestive heart failure)     a. ECHO 04/23/14 LV EF: 45% - 50%,  mild LVH, diffuse hypokinesis. LV diastolic dysfunction, high ventricular filling pressure. Mild LA dilation.   Past Surgical History:  Past Surgical History  Procedure Laterality Date  . Cholecystectomy  2005    Assessment & Plan Clinical Impression: [name] is a 38 y.o. female smoker, with h/o DM with peripheral neuropathy, HTN, seizures, suicidal ideation--most recent 03/16/14, polysubstance abuse, multiple admissions for substance abuse as well as involuntary commitment; who was admitted on 6/28 after being found down in yard with decreased MS and inability to move legs. She was found to have elevated CK, elevated glucose, lactic acid, severely distended bladder as well as CXR concerning for aspiration. UDS positive for cocaine and patient without memory of events.  Foley placed with 2.5 L from bladder and she was treated with IVF for severe rhabdomyolysis as well as IV antibiotics of probable sepsis form aspiration PNA. CT Head/Cervical spine/and abdomen without acute abnormality. CT chest with bilateral upper lobe ground glass opacities. Dr. Arnoldo Morale recommended MRI thoraco-lumbar spine to rule out SCI. MRI toracic spine negative and lumbar spine films revealed confluent severe muscle edema in lower pelvis affecting gluteal muscle, adductors and lower lumbar erector spinae muscles--likely due to severe myositis in setting of Rhabdo.   Neurology consulted for input and felt that patient's symptoms likely due to an axonal sensory-motor neuropathy as well as a lumbosacral plexopathy due to severe compressive myositis in the setting of generalized rhabdomyolysis. NCS/EMG recommended for follow up but unavailable in hospital setting. Dr. Cathie Olden consulted for input on global hypokinesis and felt that EF c/w cocaine use and recommended follow up on outpatient basis as well as stopping use of cocaine, ETOH and tobacco. Patient was treated with IV toradol and placed on naprosyn as well as tramadol for pain management. She was started on neurontin for neuropathic symptoms. She developed folliculitis RLE and was placed on Unasyn this weekend with improvement. Foley discontinued and patient with incontinence. She has had improvement in motor strength as well as activity level. CIR was recommended by rehab team. Patient transferred to CIR on 04/28/2014 .    Patient currently requires min>total assist with basic self-care skills and functional transfers secondary to muscle weakness and muscle joint tightness and decreased sitting balance, decreased standing balance, decreased postural control and decreased balance strategies.  Prior to hospitalization, patient could complete ADLs & IADLs independently.   Patient will  benefit from skilled intervention to increase independence with basic  self-care skills prior to discharge home with mother and cousin(Phyllis).  Anticipate patient will require supervision>min assist and follow up home health.  OT - End of Session Activity Tolerance: Tolerates 10 - 20 min activity with multiple rests Endurance Deficit: Yes OT Assessment Rehab Potential: Excellent Barriers to Discharge:  (none known at this time) OT Patient demonstrates impairments in the following area(s): Balance;Edema;Endurance;Motor;Pain;Perception;Safety;Sensory;Skin Integrity OT Basic ADL's Functional Problem(s): Grooming;Toileting;Bathing;Dressing OT Advanced ADL's Functional Problem(s): Simple Meal Preparation OT Transfers Functional Problem(s): Toilet;Tub/Shower OT Additional Impairment(s): None (functional strengthening > BUEs) OT Plan OT Intensity: Minimum of 1-2 x/day, 45 to 90 minutes OT Frequency: 5 out of 7 days OT Duration/Estimated Length of Stay: 14-18 days OT Treatment/Interventions: Medical illustrator training;Community reintegration;Discharge planning;DME/adaptive equipment instruction;Functional mobility training;Pain management;Neuromuscular re-education;Patient/family education;Psychosocial support;Self Care/advanced ADL retraining;Skin care/wound managment;Splinting/orthotics;Therapeutic Activities;Therapeutic Exercise;UE/LE Strength taining/ROM;UE/LE Coordination activities;Wheelchair propulsion/positioning OT Self Feeding Anticipated Outcome(s): independen (current level) OT Basic Self-Care Anticipated Outcome(s): supervision>mod I OT Toileting Anticipated Outcome(s): supervision>mod I OT Bathroom Transfers Anticipated Outcome(s): supervision>mod I OT Recommendation Patient destination: Home Follow Up Recommendations: Home health OT Equipment Recommended: 3 in 1 bedside comode;Tub/shower bench   Skilled Therapeutic Intervention Initial 1:1 occupational therapy evaluation completed. Patient with 6/10 complaints of pain in BLEs, RN made aware and  therapist assisted by re-positioning patient. Therapist donned bilateral TEDs per order. Patient then engaged in bed mobility for therapist to don brief and UB/LB dressing. Patient completed LB dressing supine in bed with total assist and sat EOB for UB dressing with steady assist. Patient transferred EOB>w/c with minimal assistance (scoot technique). Patient then sat at sink for grooming tasks of washing face/hands and brushing teeth. At end of session, left patient seated in w/c with quick release belt donned (for safety) and all needs within reach.   OT Evaluation Precautions/Restrictions  Precautions Precautions: Fall Restrictions Weight Bearing Restrictions: No  General Chart Reviewed: Yes Family/Caregiver Present: No  Vital Signs Therapy Vitals Temp: 97.9 F (36.6 C) Temp src: Oral Pulse Rate: 113 Resp: 18 BP: 121/79 mmHg Patient Position (if appropriate): Lying Oxygen Therapy SpO2: 96 %  Pain Pain Assessment Pain Assessment: 0-10 Pain Score: 6  Pain Type: Acute pain Pain Location: Leg Pain Orientation: Right;Left Pain Descriptors / Indicators: Aching Pain Onset: On-going Pain Intervention(s): RN made aware Multiple Pain Sites: No  Home Living/Prior Functioning Home Living Available Help at Discharge: Family;Available 24 hours/day;Other (Comment) (patient reports mom & cousing Dispensing optician) are available 24/7 once d/c) Type of Home: Mobile home Home Access: Stairs to enter [employer] of Steps: 2 steps in back; family building ramp in back (front with 5 steps) Entrance Stairs-Rails: None Home Layout: One level Additional Comments: pt states she doubts a wheelchair can get into her bathroom or bedroom. Would have to set up bed in her den  Lives With: [name] Responsibilities: No (patient reports her cousing assists with IADL tasks) Current License: No Mode of Transportation: Family Occupation: Unemployed Prior Function Level of  Independence: Independent with basic ADLs  Able to Take Stairs?: Yes  ADL - See FIM  Vision/Perception  Vision- History Baseline Vision/History: Wears glasses Wears Glasses: At all times Patient Visual Report: No change from baseline Vision- Assessment Vision Assessment?: No apparent visual deficits   Cognition Overall Cognitive Status: Within Functional Limits for tasks assessed Orientation Level: Oriented X4 Memory: Appears intact Awareness: Appears intact Problem Solving: Appears intact Safety/Judgment: Appears intact  Sensation Sensation Additional Comments: BUEs appeac intact  Coordination Gross Motor Movements are Fluid and Coordinated: Yes (BUEs) Fine Motor Movements are Fluid and Coordinated: Yes (BUEs) Patient with complaints of "Pins & Needles > Bilateral dorsum of feet.   Motor  Motor Motor: Within Functional Limits Motor - Skilled Clinical Observations: Weak/painful w/ all LE movements  Mobility  Bed Mobility Bed Mobility: Rolling Right;Rolling Left;Scooting to HOB Rolling Right: 3: Mod assist Rolling Right Details (indicate cue type and reason): Assist w/ LE management Rolling Left: 3: Mod assist Rolling Left Details (indicate cue type and reason): Assist w/ LE management Scooting to HOB: 2: Max assist Transfers Sit to Stand: 1: +2 Total assist   Trunk/Postural Assessment  Cervical Assessment Cervical Assessment: Within Functional Limits Thoracic Assessment Thoracic Assessment: Within Functional Limits   Balance Balance Balance Assessed: Yes Static Sitting Balance Static Sitting - Balance Support: Bilateral upper extremity supported Static Sitting - Level of Assistance: 5: Stand by assistance Dynamic Sitting Balance Dynamic Sitting - Balance Support: Bilateral upper extremity supported Dynamic Sitting - Level of Assistance: 4: Min assist  Extremity/Trunk Assessment RUE Assessment RUE Assessment: Within Functional Limits (Can benefit from  functional strengthening) LUE Assessment LUE Assessment: Within Functional Limits (Can benefit from functional strengthening)  FIM:  FIM - Eating Eating Activity: 7: Complete independence:no helper FIM - Grooming Grooming Steps: Wash, rinse, dry face;Wash, rinse, dry hands;Oral care, brush teeth, clean dentures;Brush, comb hair Grooming: 5: Set-up assist to obtain items FIM - Bathing Bathing: 0: Activity did not occur FIM - Upper Body Dressing/Undressing Upper body dressing/undressing steps patient completed: Thread/unthread right sleeve of pullover shirt/dresss;Thread/unthread left sleeve of pullover shirt/dress;Put head through opening of pull over shirt/dress;Pull shirt over trunk Upper body dressing/undressing: 4: Steadying assist FIM - Lower Body Dressing/Undressing Lower body dressing/undressing: 1: Total-Patient completed less than 25% of tasks FIM - Toileting Toileting: 0: Activity did not occur FIM - Bed/Chair Transfer Bed/Chair Transfer: 4: Supine > Sit: Min A (steadying Pt. > 75%/lift 1 leg);4: Bed > Chair or W/C: Min A (steadying Pt. > 75%) FIM - Air cabin crew Transfers: 0-Activity did not occur FIM - Camera operator Transfers: 0-Activity did not occur or was simulated   Refer to Care Plan for Long Term Goals  Recommendations for other services: Neuropsych  Discharge Criteria: Patient will be discharged from OT if patient refuses treatment 3 consecutive times without medical reason, if treatment goals not met, if there is a change in medical status, if patient makes no progress towards goals or if patient is discharged from hospital.  The above assessment, treatment plan, treatment alternatives and goals were discussed and mutually agreed upon: by patient  , 04/29/2014, 12:13 PM

## 2014-04-29 NOTE — Progress Notes (Signed)
Occupational Therapy Session Note  Patient Details  Name: [name] MRN: [id number] Date of Birth: April 20, 1976  Today's Date: 04/29/2014 Time: 1300-1330 Time Calculation (min): 30 min  Skilled Therapeutic Interventions/Progress Updates:    Pt performed transfer from EOB to wheelchair with mod assist squat pivot to the left side.  She then propelled her wheelchair down to the gym with supervision and transferred to the therapy mat squat pivot to the right with max assist.  She needed 2 attempts to get her bottom on the mat completely as the first attempt only made it in-between the chair and mat.  Once on the mat pt is able to sit statically with supervision.  Worked on trunk strengthening with ball toss using bilateral UEs to catch and toss the medium sized yellow ball.  She was able to perform with close supervision.  Also had her work on crossing her arms and maintaining neutral balance while therapist applied resistance in multiple directions. She was able to complete but could only tolerate minimal resistance forwards.  Finished session by having her work on sit to stand and standing from the EOM.  She needed total assist for 2 intervals of standing.  The first interval lasted for 30 seconds, and the second lasted for 15-20 seconds.  Decreased hip, knee, and lumbar, extension in standing.    Therapy Documentation Precautions:  Precautions Precautions: Fall Restrictions Weight Bearing Restrictions: No  Pain: Pain Assessment Pain Assessment: Faces Pain Score: 6  Pain Type: Acute pain Pain Location: Foot Pain Orientation: Right;Left Pain Descriptors / Indicators: Sore Pain Onset: On-going Patients Stated Pain Goal: 0 Pain Intervention(s): Repositioned;Ambulation/increased activity  See FIM for current functional status  Therapy/Group: Individual Therapy  Dakarai Mcglocklin OTR/L 04/29/2014, 3:46 PM

## 2014-04-30 ENCOUNTER — Inpatient Hospital Stay (HOSPITAL_COMMUNITY): Payer: Self-pay

## 2014-04-30 ENCOUNTER — Inpatient Hospital Stay (HOSPITAL_COMMUNITY): Payer: Self-pay | Admitting: Occupational Therapy

## 2014-04-30 ENCOUNTER — Encounter (HOSPITAL_COMMUNITY): Payer: Self-pay | Admitting: Occupational Therapy

## 2014-04-30 ENCOUNTER — Inpatient Hospital Stay (HOSPITAL_COMMUNITY): Payer: Self-pay | Admitting: *Deleted

## 2014-04-30 DIAGNOSIS — G92 Toxic encephalopathy: Secondary | ICD-10-CM

## 2014-04-30 DIAGNOSIS — G929 Unspecified toxic encephalopathy: Secondary | ICD-10-CM

## 2014-04-30 DIAGNOSIS — E119 Type 2 diabetes mellitus without complications: Secondary | ICD-10-CM

## 2014-04-30 DIAGNOSIS — F319 Bipolar disorder, unspecified: Secondary | ICD-10-CM

## 2014-04-30 DIAGNOSIS — G729 Myopathy, unspecified: Secondary | ICD-10-CM

## 2014-04-30 DIAGNOSIS — R880 Cloudy (hemodialysis) (peritoneal) dialysis effluent: Secondary | ICD-10-CM

## 2014-04-30 DIAGNOSIS — R609 Edema, unspecified: Secondary | ICD-10-CM

## 2014-04-30 DIAGNOSIS — G8220 Paraplegia, unspecified: Secondary | ICD-10-CM

## 2014-04-30 LAB — CK: Total CK: 304 U/L — ABNORMAL HIGH (ref 7–177)

## 2014-04-30 LAB — GLUCOSE, CAPILLARY
Glucose-Capillary: 133 mg/dL — ABNORMAL HIGH (ref 70–99)
Glucose-Capillary: 159 mg/dL — ABNORMAL HIGH (ref 70–99)
Glucose-Capillary: 254 mg/dL — ABNORMAL HIGH (ref 70–99)

## 2014-04-30 LAB — URINE CULTURE
Colony Count: NO GROWTH
Culture: NO GROWTH

## 2014-04-30 MED ORDER — SENNOSIDES-DOCUSATE SODIUM 8.6-50 MG PO TABS
2.0000 | ORAL_TABLET | Freq: Every day | ORAL | Status: DC
Start: 2014-04-30 — End: 2014-05-02
  Administered 2014-04-30 – 2014-05-01 (×2): 2 via ORAL
  Filled 2014-04-30 (×3): qty 2

## 2014-04-30 MED ORDER — PRO-STAT SUGAR FREE PO LIQD
30.0000 mL | Freq: Three times a day (TID) | ORAL | Status: DC
  Administered 2014-04-30 – 2014-05-01 (×4): 30 mL via ORAL
  Administered 2014-05-03: 08:00:00 via ORAL
  Administered 2014-05-07 – 2014-05-08 (×2): 30 mL via ORAL
  Filled 2014-04-30 (×44): qty 30

## 2014-04-30 NOTE — Progress Notes (Signed)
Bilateral lower extremity venous duplex:  No evidence of DVT, superficial thrombosis, or Baker's Cyst.   

## 2014-04-30 NOTE — Progress Notes (Signed)
Occupational Therapy Session Notes  Patient Details  Name: [name] MRN: [id number] Date of Birth: 07/23/1976  Today's Date: 04/30/2014  Short Term Goals: Week 1:  OT Short Term Goal 1 (Week 1): Patient will perform UB/LB bathing with minimal assistance OT Short Term Goal 2 (Week 1): Patient will perform LB dressing with moderate assistance OT Short Term Goal 3 (Week 1): Patient will perform toilet transfers with minimal assistance using DME prn OT Short Term Goal 4 (Week 1): Patient will be educated on a BUE HEP for functional strengthening > BUEs in order to increase independence with functional mobility/transfers  Skilled Therapeutic Interventions/Progress Updates:   Session #1 [phone] - 40 Minutes Missed 20 Minutes secondary to unwilling/refusal secondary to breakfast Individual Therapy Patient with 6/10 complaints of pain in BLEs, RN aware and present for medication management Patient received supine in bed asleep. Patient refused therapy until she finished with breakfast. Therapist administered patient's breakfast tray. Patient arrived later and patient willing to work with therapist. Focused skilled intervention on ADL retraining from bed level, therapist set-up basin & clothes and patient completed UB/LB bathing as well as UB/LB dressing. Also focused skilled intervention on bed mobility (rolling left <> right & supine>sidelying>sit), EOB>w/c scoot pivot transfer, education on w/c set-up prior to & post transfers, and grooming tasks seated at sink in w/c. Also educated patient on boosting for pressure relief in w/c every 20-30 minutes, w/c pushups as a BUE HEP for functional strengthening, and DME (drop arm BSC & tub transfer bench). At end of session, left patient seated in w/c with all needs within reach.   Session #2 [id number] - 25 Minutes Individual Therapy Patient with 4/10 complaints of pain, patient stated she was happy pain in starting to decrease Patient received  supine in bed asleep with NT present. Patient worked on sitting EOB and sat EOB for clothing management prior to toileting. Patient transferred EOB>drop arm BSC  with minimal assistance (going to left). Therapist educated NT on safest & most effective way to assist patient for transfers. Patient with no luck for bowel or bladder movement sitting on BSC. Patient transferred back to EOB with min assist (going to right). Patient laid in supine for therapist to assist with donning of new brief and pulling pants > waist. Patient took a small rest break, then sat EOB and transferred > w/c with min assist. Patient with good bottom clearance during transfers, almost completing squat pivot transfers. Patient worked on donning bilateral leg rests. At end of session, left patient seated in w/c with all needs within reach. Patient's mom present for most of session and pleased with patient's progress so far.   Precautions:  Precautions Precautions: Fall Restrictions Weight Bearing Restrictions: No  See FIM for current functional status  [name] 04/30/2014, 7:20 AM

## 2014-04-30 NOTE — Progress Notes (Signed)
Recreational Therapy Session Note  Patient Details  Name: [name] MRN: [id number] Date of Birth: 05-26-1976 Today's Date: 04/30/2014  Pain: c/o pain-unrated & fatigue Skilled Therapeutic Interventions/Progress Updates: Initiated eval, pt exhausted.  Will attempt eval completion early next week.  Andrews 04/30/2014, 6:09 PM

## 2014-04-30 NOTE — Progress Notes (Signed)
[facility]     PROGRESS NOTE    Subjective/Complaints: Doing fairly well. Sore but making through therapies. Denies sob, cp.   Objective: Vital Signs: Blood pressure 120/79, pulse 111, temperature 97.9 F (36.6 C), temperature source Oral, resp. rate 18, weight 117.482 kg (259 lb), SpO2 94.00%. No results found.  Recent Labs  04/29/14 0830  WBC 8.4  HGB 13.6  HCT 39.9  PLT 384    Recent Labs  04/29/14 0830  NA 133*  K 4.8  CL 98  GLUCOSE 246*  BUN 14  CREATININE 0.63  CALCIUM 8.5   CBG (last 3)   Recent Labs  04/29/14 1612 04/29/14 2109 04/30/14 0736  GLUCAP 251* 212* 133*    Wt Readings from Last 3 Encounters:  04/30/14 117.482 kg (259 lb)  04/28/14 114.579 kg (252 lb 9.6 oz)    Physical Exam:  Constitutional: She is oriented to person, place, and time. She appears well-developed and well-nourished.    HENT:  Head: Normocephalic and atraumatic.  Eyes: Conjunctivae are normal. Pupils are equal, round, and reactive to light.  Neck: Normal range of motion. Neck supple.  Cardiovascular: Normal rate and regular rhythm.  No murmur heard.  Respiratory: Effort normal and breath sounds normal. No respiratory distress. She has no wheezes.  GI: Soft. Bowel sounds are normal.  Musculoskeletal:  Denies any pain with attempts at ROM bilateral hips, knees and feet. 2+ pitting edema BLE from hips to feet-- proximal > distal. Evidence of healing/drying folliculitis RLE. Vesicular rash Neurological: She is alert and oriented to person, place, and time.  Follows commands without difficulty. 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  DTR's trace to absent in LE's  Psych: pt pleasant and generally cooperative.   Assessment/Plan: 1. Functional deficits secondary to rhabdomyolysis with secondary  polyneuropathy which require 3+ hours per day of interdisciplinary therapy in a comprehensive inpatient rehab setting. Physiatrist is providing close team supervision and 24 hour management of active medical problems listed below. Physiatrist and rehab team continue to assess barriers to discharge/monitor patient progress toward functional and medical goals. FIM: FIM - Bathing Bathing Steps Patient Completed: Chest;Right Arm;Left Arm;Abdomen;Right upper leg;Left upper leg Bathing: 3: Mod-Patient completes 5-7 69f 10 parts or 50-74%  FIM - Upper Body Dressing/Undressing Upper body dressing/undressing steps patient completed: Thread/unthread right sleeve of pullover shirt/dresss;Thread/unthread left sleeve of pullover shirt/dress;Put head through opening of pull over shirt/dress;Pull shirt over trunk Upper body dressing/undressing: 5: Supervision: Safety issues/verbal cues (supine in bed) FIM - Lower Body Dressing/Undressing Lower body dressing/undressing: 1: Total-Patient completed less than 25% of tasks  FIM - Toileting Toileting: 0: Activity did not occur  FIM - Air cabin crew Transfers: 0-Activity did not occur  FIM - Control and instrumentation engineer Devices: Arm rests Bed/Chair Transfer: 5: Sit > Supine: Supervision (verbal cues/safety issues);2: Chair or W/C > Bed: Max A (lift and lower assist);2: Bed > Chair or W/C: Max A (lift and lower assist)  FIM - Locomotion: Wheelchair Distance: 100 Locomotion: Wheelchair: 2: Travels 50 - 149 ft with supervision, cueing or coaxing FIM - Locomotion: Ambulation Locomotion: Ambulation: 0: Activity did not occur  Comprehension Comprehension Mode: Auditory Comprehension: 5-Follows basic conversation/direction: With no assist  Expression Expression Mode: Verbal Expression: 5-Expresses basic needs/ideas: With no assist  Social Interaction Social  Interaction: 6-Interacts appropriately with others with medication or  extra time (anti-anxiety, antidepressant).  Problem Solving Problem Solving: 5-Solves basic problems: With no assist  Memory Memory: 6-More than reasonable amt of time  Medical Problem List and Plan:  1. 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 DVT Prophylaxis/Anticoagulation: Pharmaceutical: Lovenox   -dopplers pending 3. Pain Management: Will discontinue naprosyn with history of rhabdomyolysis. Continue neurontin 300 mg tid as well as elavil 100 mg at bedtime. Monitor for SE of medications.  4. Mood: No signs of depression or anxiety noted. LCSW to follow for evaluation and support. Medications compared with care everywhere notes ( tegretol 500 mg tid, Trazodone 100 Mg/HS prn, cogentin 1 mg tid and haldol 5mg  tid) and patient's input conflicts with records. Mother advised to bring medication in. Will get records release form to get formation from [facility] also. Continue current klonopin, Seroquel and haldol.   -mood appropriate at present 5. Neuropsych: This patient is capable of making decisions on her own behalf.  6. DM type 2--poorly controlled: Hgb A1c-10.4. She reports that she used novolog tid ac and levemir 60 units at home.   -increased levemir to 20 units bid/ titrate further as indicated. --will need further titration likely--watch today 7. Urinary incontinence: UA with yeast and wbc's---begin diflucan . - Monitor voiding with PVR c  Cath for volumes > 350 cc. (inconsistent volumes presently) 8. Folliculitis: will change Unasyn to keflex for 7 total day treatment.  9. Renal cysts: Will need follow up with PMD on outpatient basis for workup.  10. Peripheral edema: Rule out DVT as cause.(doppler pending) Likely due to compression and paraparesis. Encourage elevation. TEDs. May need diuretics additionally to help with symptoms.  LOS (Days) 2 A FACE TO FACE EVALUATION WAS PERFORMED  SWARTZ,ZACHARY  T 04/30/2014 9:28 AM

## 2014-04-30 NOTE — Progress Notes (Signed)
Physical Therapy Session Note  Patient Details  Name: [name] MRN: [id number] Date of Birth: 1975-12-28  Today's Date: 04/30/2014 Time: [id number] Time Calculation (min): 39 min  Short Term Goals: Week 1:  PT Short Term Goal 1 (Week 1): Pt to perform sliding board transfers w/ MinA consistently PT Short Term Goal 2 (Week 1): Pt to perform sit to stand w/ ModA PT Short Term Goal 3 (Week 1): Pt to tolerate upright standing w/ pain <6/10 PT Short Term Goal 4 (Week 1): Pt will manage w/c parts w/ supervision PT Short Term Goal 5 (Week 1): Pt to perform bed mobility w/ MinA  Skilled Therapeutic Interventions/Progress Updates:  Pt. Supine in bed with HOB elevated and asleep. Pt. [place] and willing to participate with therapy; indicates she has increased BLE soreness.  NMReEd: Pt. Performed multiple transfers while performing bedmobility: log rolling, scooting, and repositioning. Pt. Required one rail occasionally, and quickly tires from activity; requires frequent rest/recovery breaks. Pt. Supine>sit EOB with (S) with pt. Managing BLEs with BUEs; feet flat on supportive surface for increased proprioception. Pt. Displays good postural control and stability in static sitting. Pt. Performed pre-stand shifting weigh forward with quadricept activation as nose passed beyond toes; patient able to initiate upward movement, but lacks full muscle activation to mini-squat or stand erect: performed 4 x 10 reps with frequent rest/recovery breaks. Patient facilitated AAROM movements supine in bed of BLE with L>R control/strength/movement range.  Pt. Reported unrated pain/discomfort in BLE and described as "very sore"; discomfort subsided with AAROM, ROM, repositioning, and rest.  Pt supine in bed with HOB elevated and all needs within reach. Pt. States she can not do any more today.  Therapy Documentation Precautions:  Precautions Precautions: Fall Restrictions Weight Bearing Restrictions: No  See  FIM for current functional status  Therapy/Group: Individual Therapy  Juluis Mire 04/30/2014, 3:48 PM

## 2014-04-30 NOTE — Progress Notes (Signed)
Physical Therapy Session Note  Patient Details  Name: [name] MRN: [id number] Date of Birth: 28-Nov-1975  Today's Date: 04/30/2014 Time: [id number] Time Calculation (min): 59 min  Short Term Goals: Week 1:  PT Short Term Goal 1 (Week 1): Pt to perform sliding board transfers w/ MinA consistently PT Short Term Goal 2 (Week 1): Pt to perform sit to stand w/ ModA PT Short Term Goal 3 (Week 1): Pt to tolerate upright standing w/ pain <6/10 PT Short Term Goal 4 (Week 1): Pt will manage w/c parts w/ supervision PT Short Term Goal 5 (Week 1): Pt to perform bed mobility w/ MinA  Skilled Therapeutic Interventions/Progress Updates:    Pt received seated in w/c agreeable to participate in therapy w/ min encouragement. Pt propelled w/c 150' to rehab gym w/ supervision and min cueing for sequencing and hand placement. Pt w/ MaxA squat pivot transfer w/c > mat table. Worked on trunk control seated EOM w/ alternating isometrics, pt able to take mod resistance in all directions except rotation (min resistance). Pt able to stand 1x w/ +2 Assist for blocking foot/knee and assisting at UE. Pt stood 30s, was able to tuck hips and stand up straight w/ mod cueing. Worked on bed mobility rolling L and R, pt able to move supine <> sit w/ supervision, MinA for rolling. Pt reported pain in L hip in L sidelying, but bearable enough to perform 5x clams w/ RLE in L sidelying. Pt performed 10x clams w/ LLE in R sidelying. Pt also performed heel slides/hip abduction in supine 5x w/ RLE 10x w/ LLE. Pt performs scoot transfer to L w/ supervision bed>w/c, unable to perform w/c > hospital bed due to height difference (bed was in lowest position). For squat pivot pt requires Mod-MaxA depending on fatigue. Pt left supine in bed w/ all needs within reach.  Therapy Documentation Precautions:  Precautions Precautions: Fall Restrictions Weight Bearing Restrictions: No Pain: Pain Assessment Pain Assessment: 0-10 Pain Score: 7   Pain Location: Hip Pain Orientation: Right;Left Pain Descriptors / Indicators: Sore Pain Onset: On-going Patients Stated Pain Goal: 0 Pain Intervention(s):  (Pt pre-medicated)  See FIM for current functional status  Therapy/Group: Individual Therapy  Rada Hay Rada Hay, PT, DPT  04/30/2014, 12:06 PM

## 2014-04-30 NOTE — Patient Care Conference (Signed)
Inpatient RehabilitationTeam Conference and Plan of Care Update Date: 04/29/2014   Time: 2:30 PM    Patient Name: [name]      Medical Record Number: [id number]  Date of Birth: 11-11-1975 Sex: Female         Room/Bed: 4W21C/4W21C-01 Payor Info: Payor: /    Admitting Diagnosis: MYOPATHY  Admit Date/Time:  04/28/2014  3:52 PM Admission Comments: No comment available   Primary Diagnosis:  <principal problem not specified> Principal Problem: <principal problem not specified>  Patient Active Problem List   Diagnosis Date Noted  . 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 Cocaine abuse 04/23/2014  . Bipolar disorder, unspecified 04/23/2014  . Morbid obesity 04/23/2014  . DM type 2 (diabetes mellitus, type 2)- poorly controlled 04/23/2014  . Toxic encephalopathy 04/20/2014  . 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 Lactic acid acidosis 04/20/2014  . Paraparesis of both lower limbs 04/20/2014  . HCAP (healthcare-associated pneumonia) 04/20/2014    Expected Discharge Date: Expected Discharge Date: 05/16/14  Team Members Present: Physician leading conference: Dr. Alger Simons Social Worker Present: [name], LCSW Nurse Present: Elliot Cousin, RN PT Present: Other (comment) Rada Hay, PT and [place]ilford Shi, PT) OT Present: Salome Spotted, OT;Patricia Lissa Hoard, OT SLP Present: Weston Anna, SLP Other (Discipline and Name): Danne Baxter, RN [facility]) PPS Coordinator present : Daiva Nakayama, RN, CRRN;Becky Alwyn Ren, PT     Current Status/Progress Goal Weekly Team Focus  Medical   rhabdomyolysis, peripheral neuropathy/nerve damage  improve pain control, maximize lower extremity use and mobility  pain, wound care   Bowel/Bladder   cont of bowel and bladder/ can be incontinient at time  to be cont of bowel and bladder  cont of bowel and bladder   Swallow/Nutrition/ Hydration             ADL's   overall min>total assist  overall supervision>min assist  (goals set for sit<>stand level)  bed mobility, ADL retraining, dynamic sitting balance, functional mobility/transfers, sit<>stands, overall activity tolerance/endurance   Mobility   ModA  mod (I) w/c level, (s) for transfers, CGA for short ambulation/dynamic standing balance  upright tolerance, standing tolerance   Communication             Safety/Cognition/ Behavioral Observations            Pain   denied pain at this time / scheduled tramadol 100 mg  less than 2  aqssess q4h   Skin   blister to leg  free of new skin breakdown  assess skin qshift    Rehab Goals Patient on target to meet rehab goals: Yes *See Care Plan and progress notes for long and short-term goals.  Barriers to Discharge: pain, neurological changes    Possible Resolutions to Barriers:  adaptive equipment, strength training, improved activity tolerance    Discharge Planning/Teaching Needs:  home with family to provide any needed assistance      Team Discussion:  New eval.  Medically very comples.  Anticipate mod i w/c level goals.  (grandfather building ramp).  No concerns at this time  Revisions to Treatment Plan:  None   Continued Need for Acute Rehabilitation Level of Care: The patient requires daily medical management by a physician with specialized training in physical medicine and rehabilitation for the following conditions: Daily direction of a multidisciplinary physical rehabilitation program to ensure safe treatment while eliciting the highest outcome that is of practical value to the patient.: Yes Daily medical management of patient stability for  increased activity during participation in an intensive rehabilitation regime.: Yes Daily analysis of laboratory values and/or radiology reports with any subsequent need for medication adjustment of medical intervention for : Neurological problems;Other;Post surgical problems  Riku Buttery 04/30/2014, 4:42 PM

## 2014-05-01 ENCOUNTER — Inpatient Hospital Stay (HOSPITAL_COMMUNITY): Payer: Medicaid Other

## 2014-05-01 ENCOUNTER — Inpatient Hospital Stay (HOSPITAL_COMMUNITY): Payer: Self-pay

## 2014-05-01 ENCOUNTER — Inpatient Hospital Stay (HOSPITAL_COMMUNITY): Payer: Medicaid Other | Admitting: Occupational Therapy

## 2014-05-01 ENCOUNTER — Inpatient Hospital Stay (HOSPITAL_COMMUNITY): Payer: Self-pay | Admitting: Occupational Therapy

## 2014-05-01 LAB — GLUCOSE, CAPILLARY
Glucose-Capillary: 156 mg/dL — ABNORMAL HIGH (ref 70–99)
Glucose-Capillary: 144 mg/dL — ABNORMAL HIGH (ref 70–99)
Glucose-Capillary: 184 mg/dL — ABNORMAL HIGH (ref 70–99)
Glucose-Capillary: 201 mg/dL — ABNORMAL HIGH (ref 70–99)
Glucose-Capillary: 181 mg/dL — ABNORMAL HIGH (ref 70–99)

## 2014-05-01 LAB — CK: Total CK: 213 U/L — ABNORMAL HIGH (ref 7–177)

## 2014-05-01 MED ORDER — INSULIN DETEMIR 100 UNIT/ML ~~LOC~~ SOLN
24.0000 [IU] | Freq: Two times a day (BID) | SUBCUTANEOUS | Status: DC
  Administered 2014-05-01 – 2014-05-02 (×3): 24 [IU] via SUBCUTANEOUS
  Filled 2014-05-01 (×4): qty 0.24

## 2014-05-01 NOTE — IPOC Note (Addendum)
Overall Plan of Care [facility]) Patient Details Name: KRISANDRA BRIZUELA MRN: [id number] DOB: 10/07/1976  Admitting Diagnosis: MYOPATHY  Hospital Problems: Active Problems:   Myopathy     Functional Problem List: Nursing Bowel;Edema;Endurance;Medication Management;Motor;Nutrition;Pain;Safety;Sensory;Skin Integrity  PT Balance;Edema;Endurance;Motor;Pain;Sensory  OT Balance;Edema;Endurance;Motor;Pain;Perception;Safety;Sensory;Skin Integrity  SLP    TR Activity tolerance, functional mobility, balance, cognition, safety       Basic ADL's: OT Grooming;Toileting;Bathing;Dressing     Advanced  ADL's: OT Simple Meal Preparation     Transfers: PT Bed Mobility;Bed to Chair;Car;Furniture;Floor  OT Toilet;Tub/Shower     Locomotion: PT Ambulation;Wheelchair Mobility;Stairs     Additional Impairments: OT None (functional strengthening > BUEs)  SLP        TR  leisure awareness    Anticipated Outcomes Item Anticipated Outcome  Self Feeding independen (current level)  Swallowing      Basic self-care  supervision>mod I  Toileting  supervision>mod I   Bathroom Transfers supervision>mod I  Bowel/Bladder  manage with mod I assist  Transfers  supervision  Locomotion  mod (I) from w/c level, supervision for short ambulation  Communication     Cognition     Pain  Pain at or below 4  Safety/Judgment  maintain safety with mod I assist   Therapy Plan: PT Intensity: Minimum of 1-2 x/day ,45 to 90 minutes PT Frequency: 5 out of 7 days PT Duration Estimated Length of Stay: 14-18 days OT Intensity: Minimum of 1-2 x/day, 45 to 90 minutes OT Frequency: 5 out of 7 days OT Duration/Estimated Length of Stay: 14-18 days TR Duration/ELOS:  Days/weeks TR Frequency:  Min 1 time per week >20 minutes        Team Interventions: Nursing Interventions Patient/Family Education;Skin Care/Wound Management;Bladder Management;Bowel Management;Disease Management/Prevention;Discharge Planning;Pain  Management;Psychosocial Support;Medication Management  PT interventions Ambulation/gait training;Balance/vestibular training;Discharge planning;DME/adaptive equipment instruction;Functional mobility training;Neuromuscular re-education;Pain management;Patient/family education;Therapeutic Activities;Therapeutic Exercise;UE/LE Strength taining/ROM;Wheelchair propulsion/positioning  OT Interventions Balance/vestibular training;Community reintegration;Discharge planning;DME/adaptive equipment instruction;Functional mobility training;Pain management;Neuromuscular re-education;Patient/family education;Psychosocial support;Self Care/advanced ADL retraining;Skin care/wound managment;Splinting/orthotics;Therapeutic Activities;Therapeutic Exercise;UE/LE Strength taining/ROM;UE/LE Coordination activities;Wheelchair propulsion/positioning  SLP Interventions    TR Interventions Activity tolerance, functional mobility, balance, safety, pain, leisure education  SW/CM Interventions      Team Discharge Planning: Destination: PT-Home ,OT- Home , SLP-  Projected Follow-up: PT-Home health PT, OT-  Home health OT, SLP-  Projected Equipment Needs: PT-Wheelchair (measurements);To be determined, OT- 3 in 1 bedside comode;Tub/shower bench, SLP-  Equipment Details: PT- , OT-  Patient/family involved in discharge planning: PT- Patient,  OT-Patient, SLP-   MD ELOS: 15-18 days Medical Rehab Prognosis:  Excellent Assessment: The patient has been admitted for CIR therapies with the diagnosis of rhabdomyolysis and associated neuropathy. The team will be addressing functional mobility, strength, stamina, balance, safety, adaptive techniques and equipment, self-care, bowel and bladder mgt, patient and caregiver education, NMR, pain control, mood mgt.egosupport, orthotics, leisure awareness. Goals have been set at supervision to mod I with mobilty and self-care.Meredith Staggers, MD, FAAPMR      See Team Conference Notes  for weekly updates to the plan of care

## 2014-05-01 NOTE — Progress Notes (Signed)
[place] PHYSICAL MEDICINE & REHABILITATION     PROGRESS NOTE    Subjective/Complaints: Still feels sore, particularly in her thighs and low back. Slept well.  Objective: Vital Signs: Blood pressure 105/76, pulse 109, temperature 98.5 F (36.9 C), temperature source Oral, resp. rate 18, weight 114.76 kg (253 lb), SpO2 95.00%. No results found.  Recent Labs  04/29/14 0830  WBC 8.4  HGB 13.6  HCT 39.9  PLT 384    Recent Labs  04/29/14 0830  NA 133*  K 4.8  CL 98  GLUCOSE 246*  BUN 14  CREATININE 0.63  CALCIUM 8.5   CBG (last 3)   Recent Labs  04/30/14 1613 04/30/14 2111 05/01/14 0742  GLUCAP 254* 156* 144*    Wt Readings from Last 3 Encounters:  05/01/14 114.76 kg (253 lb)  04/28/14 114.579 kg (252 lb 9.6 oz)    Physical Exam:  Constitutional: She is oriented to person, place, and time. She appears well-developed and well-nourished.    HENT:  Head: Normocephalic and atraumatic.  Eyes: Conjunctivae are normal. Pupils are equal, round, and reactive to light.  Neck: Normal range of motion. Neck supple.  Cardiovascular: Normal rate and regular rhythm.  No murmur heard.  Respiratory: Effort normal and breath sounds normal. No respiratory distress. She has no wheezes.  GI: Soft. Bowel sounds are normal.  Musculoskeletal:  Denies any pain with attempts at ROM bilateral hips, knees and feet. 2+ pitting edema BLE from hips to feet-- proximal > distal. Evidence of healing/drying folliculitis RLE. Vesicular rash Neurological: She is alert and oriented to person, place, and time.  Follows commands without difficulty. 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  DTR's trace to absent in LE's  Psych: pt pleasant and generally cooperative.   Assessment/Plan: 1. Functional deficits secondary to rhabdomyolysis with secondary polyneuropathy  which require 3+ hours per day of interdisciplinary therapy in a comprehensive inpatient rehab setting. Physiatrist is providing close team supervision and 24 hour management of active medical problems listed below. Physiatrist and rehab team continue to assess barriers to discharge/monitor patient progress toward functional and medical goals. FIM: FIM - Bathing Bathing Steps Patient Completed: Chest;Right Arm;Left Arm;Abdomen;Right upper leg;Left upper leg Bathing: 3: Mod-Patient completes 5-7 63f 10 parts or 50-74%  FIM - Upper Body Dressing/Undressing Upper body dressing/undressing steps patient completed: Thread/unthread right sleeve of pullover shirt/dresss;Thread/unthread left sleeve of pullover shirt/dress;Put head through opening of pull over shirt/dress;Pull shirt over trunk Upper body dressing/undressing: 5: Supervision: Safety issues/verbal cues (supine in bed) FIM - Lower Body Dressing/Undressing Lower body dressing/undressing: 1: Total-Patient completed less than 25% of tasks  FIM - Toileting Toileting: 1: Total-Patient completed zero steps, helper did all 3  FIM - Radio producer Devices: Recruitment consultant Transfers: 4-To toilet/BSC: Min A (steadying Pt. > 75%);4-From toilet/BSC: Min A (steadying Pt. > 75%)  FIM - Bed/Chair Transfer Bed/Chair Transfer Assistive Devices: Bed rails (1) Bed/Chair Transfer: 5: Supine > Sit: Supervision (verbal cues/safety issues);5: Sit > Supine: Supervision (verbal cues/safety issues)  FIM - Locomotion: Wheelchair Distance: 150 Locomotion: Wheelchair: 0: Activity did not occur FIM - Locomotion: Ambulation Locomotion: Ambulation: 0: Activity did not occur  Comprehension Comprehension Mode: Auditory Comprehension: 5-Follows basic conversation/direction: With no assist  Expression Expression Mode: Verbal Expression: 5-Expresses basic needs/ideas: With no assist  Social Interaction  Social Interaction:  6-Interacts appropriately with others with medication or extra time (anti-anxiety, antidepressant).  Problem Solving Problem Solving: 5-Solves basic problems: With no assist  Memory Memory: 6-More than reasonable amt of time  Medical Problem List and Plan:  1. 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 DVT Prophylaxis/Anticoagulation: Pharmaceutical: Lovenox   -dopplers negative 3. Pain Management: Will discontinue naprosyn with history of rhabdomyolysis. Continue neurontin 300 mg tid as well as elavil 100 mg at bedtime. Monitor for SE of medications.  4. Mood: No signs of depression or anxiety noted. LCSW to follow for evaluation and support. Medications compared with care everywhere notes ( tegretol 500 mg tid, Trazodone 100 Mg/HS prn, cogentin 1 mg tid and haldol 5mg  tid) and patient's input conflicts with records. Mother advised to bring medication in. Will get records release form to get formation from [facility] also. Continue current klonopin, Seroquel and haldol.   -mood appropriate at present 5. Neuropsych: This patient is capable of making decisions on her own behalf.  6. DM type 2--poorly controlled: Hgb A1c-10.4. She reports that she used novolog tid ac and levemir 60 units at home.   -increase levemir to 24 units bid/ titrate further as indicated. 7. Urinary incontinence: continue diflucan for 2 more days and stop . - I/O cath prn. pvr's better yesterday after first voiding attempt 8. Folliculitis:  keflex/abx for 7 total day course.  9. Renal cysts: Will need follow up with PMD on outpatient basis for workup.  10. Peripheral edema: Likely due to compression and paraparesis. Encourage elevation. TEDs. May need diuretics additionally to help with symptoms.  LOS (Days) 3 A FACE TO FACE EVALUATION WAS PERFORMED  Halei Hanover T 05/01/2014 10:02 AM

## 2014-05-01 NOTE — Progress Notes (Signed)
Occupational Therapy Session Note  Patient Details  Name: [name] MRN: [id number] Date of Birth: [birth year]  Today's Date: 05/01/2014 Time: 1000-1100 Time Calculation (min): 60 min  Short Term Goals: Week 1:  OT Short Term Goal 1 (Week 1): Patient will perform UB/LB bathing with minimal assistance OT Short Term Goal 2 (Week 1): Patient will perform LB dressing with moderate assistance OT Short Term Goal 3 (Week 1): Patient will perform toilet transfers with minimal assistance using DME prn OT Short Term Goal 4 (Week 1): Patient will be educated on a BUE HEP for functional strengthening > BUEs in order to increase independence with functional mobility/transfers  Skilled Therapeutic Interventions/Progress Updates:  Patient seated in w/c upon arrival and grandparents just coming into room then stayed for bathing and dressing session.  While seated in w/c, patient washed hair at the sink, bathed UB and legs then donned shirt.  Transferred w/c>bed with mod assist squat/scoot then completed bathing peri area and buttocks with lateral leans and lift one leg with her UE while leaning.  Patient unable to wash feet or don/doff socks.  She reports that she can have family bring her LH sponge up here.  Patient and grandmother reports that the w/c will not fit in the bathroom at home so she will need to be able to ambulate short distance to utilize shower or commode in bathroom.  Patient is resigned to sponge bathe at first if needed.    Therapy Documentation Precautions:  Precautions Precautions: Fall Restrictions Weight Bearing Restrictions: No Pain: 6/10 bilateral thighs and bottom of feet. ADL: See FIM for current functional status  Therapy/Group: Individual Therapy  Aidan Caloca 05/01/2014, 12:12 PM

## 2014-05-01 NOTE — Progress Notes (Signed)
Pt voided small amount on BSC with small stool. Scanned PVR of 387 @ 1330. Encouraged pt to get back up to [facility] and void again which she voided small mount unable to measure due to toilet paper placed in pan. Completed therapy session and scanned again for 610cc residual. Reviewed need to I+O cath to relieve pressure on bladder and avoid UTI and kidney failure. Pt noted she refused the procedure; "the last time they did that they left the catheter in 5 days I don't want to be cathed".Algis Liming PAC notified of results of bladder scan and pt's refusal of cath.  Margarito Liner

## 2014-05-01 NOTE — Progress Notes (Signed)
Physical Therapy Session Note  Patient Details  Name: [name] MRN: [id number] Date of Birth: Mar 31, 1976  Today's Date: 05/01/2014 Time: 1540-1610 Time Calculation (min): 30 min  Short Term Goals: Week 1:  PT Short Term Goal 1 (Week 1): Pt to perform sliding board transfers w/ MinA consistently PT Short Term Goal 2 (Week 1): Pt to perform sit to stand w/ ModA PT Short Term Goal 3 (Week 1): Pt to tolerate upright standing w/ pain <6/10 PT Short Term Goal 4 (Week 1): Pt will manage w/c parts w/ supervision PT Short Term Goal 5 (Week 1): Pt to perform bed mobility w/ MinA  Skilled Therapeutic Interventions/Progress Updates:  1:1. Pt received semi-reclined in bed, ready for therapy. Focus this session on t/f sit<>stand, standing endurance and toileting. Pt req (S) for t/f sup>sit EOB w/ use of hospital bed functions, but min A for t/f sit>sup w/ HOB flat and no use of bed rails. Pt practiced t/f sit<>stand x5 reps from elevated bed heights and use of Sara +. Pt able to stand for 20-30seconds x3, 52min x1 and 15 seconds for last attempt w/ consistent multimodal cues for seq. Pt verbalized need to void at end of session, req min A for lateral scoot t/f bed<>drop arm commode, (S) for clothing managementand pericare. Pt left semi-reclined in bed at end of session w/ all needs in reach.   Therapy Documentation Precautions:  Precautions Precautions: Fall Restrictions Weight Bearing Restrictions: No   Pain: Pain Assessment Pain Assessment: 0-10 Pain Score: 6  Pain Location: Back Pain Orientation: Mid;Lower Pain Onset: On-going Patients Stated Pain Goal: 4 Pain Intervention(s): Medication (See eMAR)  See FIM for current functional status  Therapy/Group: Individual Therapy  Gilmore Laroche 05/01/2014, 4:37 PM

## 2014-05-01 NOTE — Progress Notes (Signed)
Physical Therapy Session Note  Patient Details  Name: [name] MRN: [id number] Date of Birth: Jun 28, 1976  Today's Date: 05/01/2014 Time: 0905-1005 Time Calculation (min): 60 min  Short Term Goals: Week 1:  PT Short Term Goal 1 (Week 1): Pt to perform sliding board transfers w/ MinA consistently PT Short Term Goal 2 (Week 1): Pt to perform sit to stand w/ ModA PT Short Term Goal 3 (Week 1): Pt to tolerate upright standing w/ pain <6/10 PT Short Term Goal 4 (Week 1): Pt will manage w/c parts w/ supervision PT Short Term Goal 5 (Week 1): Pt to perform bed mobility w/ MinA     Skilled Therapeutic Interventions/Progress Updates:   Grandparents observed tx and assisted with LE mgt during bed mobility.  Bed mobility, BSC transfer, w/c propulsion to//from room, standing in parallel bars x 15 seconds with +2 assist while pulling up on bars, with focus on trunk extension and bringing wt over feet.    Therapy Documentation Precautions:  Precautions Precautions: Fall Restrictions Weight Bearing Restrictions: No   Pain: Pain Assessment Pain Assessment: 0-10 Pain Score: 6  Pain Location: Back (and bil postererior thighs) Pain Orientation: Lower Pain Descriptors / Indicators: Tightness Pain Onset: On-going Patients Stated Pain Goal: 3 Pain Intervention(s): Medication (See eMAR) Mobility: Bed Mobility Bed Mobility: Rolling Right;Rolling Left;Right Sidelying to Sit Rolling Right: 5: Set up;3: Mod assist Rolling Left: 5: Set up;4: Min assist Right Sidelying to Sit: 5: Supervision;With rails Transfers Transfers: Yes Lateral/Scoot Transfers: 4: Min guard (to R and L) Locomotion : Architect: Yes Wheelchair Assistance: 5: Supervision Wheelchair Propulsion: Both upper extremities Distance: 150      See FIM for current functional status  Therapy/Group: Individual Therapy  Thelma Viana 05/01/2014, 10:16 AM

## 2014-05-01 NOTE — Care Management Note (Signed)
[facility]patient [facility] Individual Statement of Services  Patient Name:  [name]  Date:  05/01/2014  Welcome to the [facility].  Our goal is to provide you with an individualized program based on your diagnosis and situation, designed to meet your specific needs.  With this comprehensive rehabilitation program, you will be expected to participate in at least 3 hours of rehabilitation therapies Monday-Friday, with modified therapy programming on the weekends.  Your rehabilitation program will include the following services:  Physical Therapy (PT), Occupational Therapy (OT), 24 hour per day rehabilitation nursing, Therapeutic Recreaction (TR), Psychology, Case Management (Social Worker), Rehabilitation Medicine, Nutrition Services and Pharmacy Services  Weekly team conferences will be held on Tuesdays to discuss your progress.  Your Social Worker will talk with you frequently to get your input and to update you on team discussions.  Team conferences with you and your family in attendance may also be held.  Expected length of stay: 2-3 weeks  Overall anticipated outcome: modified independent w/c  Depending on your progress and recovery, your program may change. Your Social Worker will coordinate services and will keep you informed of any changes. Your Social Worker's name and contact numbers are listed  below.  The following services may also be recommended but are not provided by the [facility] will be made to provide these services after discharge if needed.  Arrangements include referral to agencies that provide these services.  Your insurance has been verified to be:  None - Medicaid application started Your primary doctor is:  Dr. Suzan Garibaldi  Pertinent information will be shared with your  doctor and your insurance company.  Social Worker:  [place], [place] or ([phone]   Information discussed with and copy given to patient by: [name], 05/01/2014, 3:13 PM

## 2014-05-01 NOTE — Progress Notes (Signed)
Occupational Therapy Session Note  Patient Details  Name: [name] MRN: [id number] Date of Birth: July 14, 1976  Today's Date: 05/01/2014 Time: [id number] Time Calculation (min): 45 min  Short Term Goals: Week 1:  OT Short Term Goal 1 (Week 1): Patient will perform UB/LB bathing with minimal assistance OT Short Term Goal 2 (Week 1): Patient will perform LB dressing with moderate assistance OT Short Term Goal 3 (Week 1): Patient will perform toilet transfers with minimal assistance using DME prn OT Short Term Goal 4 (Week 1): Patient will be educated on a BUE HEP for functional strengthening > BUEs in order to increase independence with functional mobility/transfers  Skilled Therapeutic Interventions/Progress Updates:    Patient seen this pm for OT intervention to address toileting and toilet transfers.  Secured a wide drop arm commode for patient as current commode to narrow for effective use.  Patient able to squat pivot transfer with min assist to maintain feet on floor, foot adjustments, and aide with forward weight shift.  Discussed options for clothing management; patient able to hike down pants while seated on wider commode, but prefers to pull pants up from bed with huge lateral leans.  Patient's nurse in to scan bladder after void.  Patient encouraged to attempt to void again, as over 300 ml remained in bladder.  Patient not at all pleased with possible need to be catheterized and willing to transfer back onto commode for second trial.    Therapy Documentation Precautions:  Precautions Precautions: Fall Restrictions Weight Bearing Restrictions: No   Pain: Pain Assessment Pain Assessment: 0-10 Pain Score: 6  Pain Location: Back (thighs) Pain Orientation: Lower;Mid Pain Descriptors / Indicators: Aching Pain Onset: On-going Patients Stated Pain Goal: 4 Pain Intervention(s): Medication (See eMAR)  See FIM for current functional status  Therapy/Group: Individual  Therapy  [name], [name] 05/01/2014, 2:16 PM

## 2014-05-02 ENCOUNTER — Inpatient Hospital Stay (HOSPITAL_COMMUNITY): Payer: Self-pay

## 2014-05-02 ENCOUNTER — Inpatient Hospital Stay (HOSPITAL_COMMUNITY): Payer: Self-pay | Admitting: Occupational Therapy

## 2014-05-02 ENCOUNTER — Encounter (HOSPITAL_COMMUNITY): Payer: Self-pay | Admitting: Occupational Therapy

## 2014-05-02 DIAGNOSIS — G8220 Paraplegia, unspecified: Secondary | ICD-10-CM

## 2014-05-02 DIAGNOSIS — F319 Bipolar disorder, unspecified: Secondary | ICD-10-CM

## 2014-05-02 DIAGNOSIS — G729 Myopathy, unspecified: Secondary | ICD-10-CM

## 2014-05-02 DIAGNOSIS — F3130 Bipolar disorder, current episode depressed, mild or moderate severity, unspecified: Secondary | ICD-10-CM

## 2014-05-02 DIAGNOSIS — F1910 Other psychoactive substance abuse, uncomplicated: Secondary | ICD-10-CM

## 2014-05-02 DIAGNOSIS — R339 Retention of urine, unspecified: Secondary | ICD-10-CM | POA: Diagnosis present

## 2014-05-02 DIAGNOSIS — G929 Unspecified toxic encephalopathy: Secondary | ICD-10-CM

## 2014-05-02 DIAGNOSIS — E119 Type 2 diabetes mellitus without complications: Secondary | ICD-10-CM

## 2014-05-02 DIAGNOSIS — G92 Toxic encephalopathy: Secondary | ICD-10-CM

## 2014-05-02 LAB — GLUCOSE, CAPILLARY
Glucose-Capillary: 203 mg/dL — ABNORMAL HIGH (ref 70–99)
Glucose-Capillary: 155 mg/dL — ABNORMAL HIGH (ref 70–99)
Glucose-Capillary: 203 mg/dL — ABNORMAL HIGH (ref 70–99)
Glucose-Capillary: 173 mg/dL — ABNORMAL HIGH (ref 70–99)

## 2014-05-02 LAB — CK: Total CK: 191 U/L — ABNORMAL HIGH (ref 7–177)

## 2014-05-02 MED ORDER — BETHANECHOL CHLORIDE 10 MG PO TABS
10.0000 mg | ORAL_TABLET | Freq: Three times a day (TID) | ORAL | Status: DC
  Administered 2014-05-02 – 2014-05-08 (×18): 10 mg via ORAL
  Filled 2014-05-02 (×22): qty 1

## 2014-05-02 MED ORDER — BISACODYL 10 MG RE SUPP
10.0000 mg | Freq: Every day | RECTAL | Status: DC
  Administered 2014-05-02 – 2014-05-12 (×2): 10 mg via RECTAL
  Filled 2014-05-02 (×3): qty 1

## 2014-05-02 MED ORDER — SENNOSIDES-DOCUSATE SODIUM 8.6-50 MG PO TABS
2.0000 | ORAL_TABLET | Freq: Two times a day (BID) | ORAL | Status: DC
  Administered 2014-05-02 – 2014-05-14 (×23): 2 via ORAL
  Filled 2014-05-02 (×28): qty 2

## 2014-05-02 NOTE — Progress Notes (Signed)
Social Work Patient ID: [name], female   DOB: [birth year], 38 y.o.   MRN: [id number]  Spoke with pt and mother yesterday to review team conference.  Both aware of targeted d/c date of 7/24 with mod i w/c goals.  Pt reports that grandfather has already built ramp for entrance.  Pt does ask if date could be changed to 7/22 as she has a hearing with [facility] on 7/23.  Have discussed with MD/PA and therapies and all agreed with change.  Have also discussed possible referral to neuropsych while here and pt agreeable.  Will get her on schedule for next week.  Continue to follow.  Woodie Degraffenreid, LCSW

## 2014-05-02 NOTE — Progress Notes (Signed)
Occupational Therapy Session Notes  Patient Details  Name: [name] MRN: [id number] Date of Birth: 08/22/1976  Today's Date: 05/02/2014  Short Term Goals: Week 1:  OT Short Term Goal 1 (Week 1): Patient will perform UB/LB bathing with minimal assistance OT Short Term Goal 2 (Week 1): Patient will perform LB dressing with moderate assistance OT Short Term Goal 3 (Week 1): Patient will perform toilet transfers with minimal assistance using DME prn OT Short Term Goal 4 (Week 1): Patient will be educated on a BUE HEP for functional strengthening > BUEs in order to increase independence with functional mobility/transfers  Skilled Therapeutic Interventions/Progress Updates:   Session #1 Patient missed 60 minutes of skilled therapy secondary to ill and refusal. Patient found supine in bed. Patient barely able to open eyes and immediately stated not feeling well when therapist entered room. Patient refused any therapy secondary to "pain in back...constipation pain...no sleep". Patient with noticeable dark circles under eyes. Therapist encouraged patient to at least get up into w/c and patient refused. Notified RN and DR of patient's complaints and refusal of therapy.   Session #2 [id number] - 53 Minutes Individual Therapy No complaints of pain Patient received supine in bed with bowel urgency. Patient sat EOB, then transferred > drop arm BSC. Patient with successful BM, then transferred BSC>EOB. Patient completed UB/LB bathing & dressing seated EOB with overall steady assist, except total assist for LB dressing. Patient's mother present throughout session and is independent to assist with bed level ADLs at this time. Talked with both about tub transfer bench for tub showers. At end of session, left patient seated EOB waiting on PT for next therapy session.   Precautions:  Precautions Precautions: Fall Restrictions Weight Bearing Restrictions: No  See FIM for current functional  status  [name] 05/02/2014, 7:31 AM

## 2014-05-02 NOTE — Progress Notes (Signed)
Physical Therapy Session Note  Patient Details  Name: [name] MRN: [id number] Date of Birth: [birth year]  Today's Date: 05/02/2014 Time: 1450-1536 Time Calculation (min): 46 min  Short Term Goals: Week 1:  PT Short Term Goal 1 (Week 1): Pt to perform sliding board transfers w/ MinA consistently PT Short Term Goal 2 (Week 1): Pt to perform sit to stand w/ ModA PT Short Term Goal 3 (Week 1): Pt to tolerate upright standing w/ pain <6/10 PT Short Term Goal 4 (Week 1): Pt will manage w/c parts w/ supervision PT Short Term Goal 5 (Week 1): Pt to perform bed mobility w/ MinA  Skilled Therapeutic Interventions/Progress Updates:   Pt. Seated on EOB post OT. Pt. Agreeable to PT session. Mother present in room. Pt. States she is feeling better and will do her best.  Therapeutic Activities: Lateral scooting for bed mobility x 2 L<>R with BUE support and dysem under each BLE/Foot for added LE control; Mod-max A for maintaining foot placement. Pt. Performed sit<>supine min - mod A for LE placement onto bed surface secondary to pain. Pt. Performed x2 sit<>stand with mod-max A from therapist with sustained contact for steady in standing. Pt. Unable to attempt additional sit<>Stands due to R thigh discomfort/soreness.   NMReEd: Pt. X 2 static standing x 1 min ea. With steady in standing, mod-max A for standing and safety. Dysem under feet for increased control and to maintain position. Pt. Performed anterior weight shifting with nose beyond toes with therapist for steady and safety. Pt. Able to initiate pre stand quad activation (minimally) with therapist providing tactile and verbal cues to continue to stand. Pt. Responds well to intense voice modulation/coaching.  Pt. Motivated to D/C as soon as possible and states she is willing to do whatever needs to be done.  Pain: managed with rest breaks. Pt. Reports 3/10 with rest progressing to 5-6/10 during activity. Pt. Has no c/o pain supine in bed post  therapy; states she is really sore in BLE around thigh area.  Pt. Supine in bed with all needs within reach. Mother present and attending pt. For comfort and additional needs.  Therapy Documentation Precautions:  Precautions Precautions: Fall Restrictions Weight Bearing Restrictions: No  See FIM for current functional status  Therapy/Group: Individual Therapy  Juluis Mire 05/02/2014, 4:11 PM
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
## 2014-05-02 NOTE — Progress Notes (Signed)
Patient has had problems with urinary retention with PVR up to 600cc and has refused In and out caths. Educated patient on need to keep bladder decompressed and that this was initiation of bladder training and that we would not place an indwelling foley. Discussed neurogenic bladder, damage to kidney due to urinary retention/hydronephrosis as well as infection risk. Patient was adamantly against bladder training and reported that she only urinates twice a day at home. Discussed that this is probably sign of retention may be due to damage from uncontrolled diabetes. She is aware of risks as well as concerns of damage to her kidneys but refuses.  Mother reports that she is unable to find any medication bottles at home. Last time patient went to [facility] was years ago. She was at [facility] in June but reports was "kept doped up" and she was using meds as Rx by primary care. [facility] who reports that patient's last visit was 02/07/14. Will psychiatry to help with input on appropriate treatment/medication adjustment.

## 2014-05-02 NOTE — Plan of Care (Signed)
Problem: Food- and Nutrition-Related Knowledge Deficit (NB-1.1) Goal: Nutrition education Formal process to instruct or train a patient/client in a skill or to impart knowledge to help patients/clients voluntarily manage or modify food choices and eating behavior to maintain or improve health. Outcome: Completed/Met Date Met:  05/02/14  RD consulted for nutrition education regarding diabetes. Patient reports that she has "heard all of this before, but I'm just stubborn." Admits that she drinks 4 liters of [employer] daily. Is agreeable to drink diet. Pt uninterested in further education. Also provided information on Low Cholesterol diet.    Lab Results  Component Value Date    HGBA1C 10.4* 04/24/2014    RD provided "Carbohydrate Counting for People with Diabetes" handout from the Academy of Nutrition and Dietetics. Discussed different food groups and their effects on blood sugar, emphasizing carbohydrate-containing foods. Provided list of carbohydrates and recommended serving sizes of common foods.  Discussed importance of controlled and consistent carbohydrate intake throughout the day. Provided examples of ways to balance meals/snacks and encouraged intake of high-fiber, whole grain complex carbohydrates. Teach back method used.  Expect poor compliance.  Body mass index is 42.1 kg/(m^2). Pt meets criteria for Obese Class III based on current BMI.  Current diet order is Carbohydrate Modified Medium, patient is consuming approximately >50% of meals at this time. Labs and medications reviewed. No further nutrition interventions warranted at this time. RD contact information provided. If additional nutrition issues arise, please re-consult RD.  Inda Coke MS, RD, LDN Inpatient Registered Dietitian Pager: [phone] After-hours pager: [phone]

## 2014-05-02 NOTE — Progress Notes (Signed)
Physical Therapy Session Note  Patient Details  Name: [name] MRN: [id number] Date of Birth: Jul 27, 1976  Today's Date: 05/02/2014 Time: [id number]  Short Term Goals: Week 1:  PT Short Term Goal 1 (Week 1): Pt to perform sliding board transfers w/ MinA consistently PT Short Term Goal 2 (Week 1): Pt to perform sit to stand w/ ModA PT Short Term Goal 3 (Week 1): Pt to tolerate upright standing w/ pain <6/10 PT Short Term Goal 4 (Week 1): Pt will manage w/c parts w/ supervision PT Short Term Goal 5 (Week 1): Pt to perform bed mobility w/ MinA  Skilled Therapeutic Interventions/Progress Updates:  Pt. Semi-reclined in bed with RN present. Pt. States she's been up all night with the urge to have a bowel movement. Pt. Is agreeable to therapy with reservations; has not slept all night and stomach hurts. RN aware of condition of patient, presently.  Therapeutic Activities: Pt. Performed bed mobility with verbal cues for sequencing. Pt. Performed lateral scooting with shoulder repositioning min A, with BLE requiring Max A for repositioning. Sit<>supine x 2 with (S) to min A. Pt. EOB for trunk strengthening with verbal and tactile cue for postural alignment and stability; pt. Perform weight shifting L<>R, Anterior/Posterior, and with trunk rotation and UEs crossing midline. Pt. Demonstrates improved coordination with movements, cannot maintain feet flat while seated and requires max A for maintaining partial weight bearing for seated stability.  Self-Care: Pt. Required max A in supine to doff shorts for toileting. Pt. On bed pan, required mod - max A for log roll and positioning. Pt. States she is "worn out" and unable to roll and maintain position for any length of time. Noted that pt. Has some skin irritation due to frequent and prolonged time on bed pan.  Pt. Has R hip/thigh pain at 5/10 diminishing with rest/repostitioning. RN notified of pain and BM during session.  Pt. Supine in bed with  clean chuck-pad, shorts doffed for comfort, and all needs within reach.  Therapy Documentation Precautions:  Precautions Precautions: Fall Restrictions Weight Bearing Restrictions: No  See FIM for current functional status  Therapy/Group: Individual Therapy  Juluis Mire 05/02/2014, 9:21 AM
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
## 2014-05-02 NOTE — Progress Notes (Addendum)
[facility]ICINE & REHABILITATION     PROGRESS NOTE    Subjective/Complaints: Having occasional urinary retention, refuses cath despite high volumes and education..  Objective: Vital Signs: Blood pressure 122/80, pulse 120, temperature 98.2 F (36.8 C), temperature source Oral, resp. rate 18, weight 114.76 kg (253 lb), SpO2 100.00%. No results found. No results found for this basename: WBC, HGB, HCT, PLT,  in the last 72 hours No results found for this basename: NA, K, CL, CO, GLUCOSE, BUN, CREATININE, CALCIUM,  in the last 72 hours CBG (last 3)   Recent Labs  05/01/14 1847 05/01/14 2123 05/02/14 0725  GLUCAP 201* 181* 203*    Wt Readings from Last 3 Encounters:  05/01/14 114.76 kg (253 lb)  04/28/14 114.579 kg (252 lb 9.6 oz)    Physical Exam:  Constitutional: She is oriented to person, place, and time. She appears well-developed and well-nourished.    HENT:  Head: Normocephalic and atraumatic.  Eyes: Conjunctivae are normal. Pupils are equal, round, and reactive to light.  Neck: Normal range of motion. Neck supple.  Cardiovascular: Normal rate and regular rhythm.  No murmur heard.  Respiratory: Effort normal and breath sounds normal. No respiratory distress. She has no wheezes.  GI: Soft. Bowel sounds are normal.  Musculoskeletal:  Denies any pain with attempts at ROM bilateral hips, knees and feet. 2+ pitting edema BLE from hips to feet-- proximal > distal. Evidence of healing/drying folliculitis RLE. Vesicular rash Neurological: She is alert and oriented to person, place, and time.  Follows commands without difficulty. 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  DTR's trace to absent in LE's  Psych: pt pleasant and generally cooperative.   Assessment/Plan: 1. Functional deficits secondary to rhabdomyolysis with secondary  polyneuropathy which require 3+ hours per day of interdisciplinary therapy in a comprehensive inpatient rehab setting. Physiatrist is providing close team supervision and 24 hour management of active medical problems listed below. Physiatrist and rehab team continue to assess barriers to discharge/monitor patient progress toward functional and medical goals. FIM: FIM - Bathing Bathing Steps Patient Completed: Chest;Right Arm;Left Arm;Abdomen;Right upper leg;Left upper leg;Front perineal area;Buttocks (Seated at sink and EOB with Lat leans) Bathing: 4: Min-Patient completes 8-9 35f 10 parts or 75+ percent  FIM - Upper Body Dressing/Undressing Upper body dressing/undressing steps patient completed: Thread/unthread right sleeve of pullover shirt/dresss;Thread/unthread left sleeve of pullover shirt/dress;Put head through opening of pull over shirt/dress;Pull shirt over trunk Upper body dressing/undressing: 5: Supervision: Safety issues/verbal cues (seated in w/c) FIM - Lower Body Dressing/Undressing Lower body dressing/undressing steps patient completed: Thread/unthread right pants leg;Thread/unthread left pants leg (no shoes due to edema) Lower body dressing/undressing: 1: Total-Patient completed less than 25% of tasks (EOB and Lateral leans)  FIM - Toileting Toileting steps completed by patient: Adjust clothing prior to toileting;Performs perineal hygiene;Adjust clothing after toileting Toileting: 5: Supervision: Safety issues/verbal cues  FIM - Radio producer Devices: Bedside commode Toilet Transfers: 4-To toilet/BSC: Min A (steadying Pt. > 75%);4-From toilet/BSC: Min A (steadying Pt. > 75%)  FIM - Bed/Chair Transfer Bed/Chair Transfer Assistive Devices: Bed rails;HOB elevated Bed/Chair Transfer: 5: Supine > Sit: Supervision (verbal cues/safety issues);4: Sit > Supine: Min A (steadying pt. > 75%/lift 1 leg)  FIM - Locomotion: Wheelchair Distance:  150 Locomotion: Wheelchair: 0: Activity did not occur FIM - Locomotion:  Ambulation Locomotion: Ambulation: 0: Activity did not occur  Comprehension Comprehension Mode: Auditory Comprehension: 5-Follows basic conversation/direction: With no assist  Expression Expression Mode: Verbal Expression: 5-Expresses basic needs/ideas: With no assist  Social Interaction Social Interaction: 6-Interacts appropriately with others with medication or extra time (anti-anxiety, antidepressant).  Problem Solving Problem Solving: 5-Solves basic problems: With no assist  Memory Memory: 6-More than reasonable amt of time  Medical Problem List and Plan:  1. 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 DVT Prophylaxis/Anticoagulation: Pharmaceutical: Lovenox   -dopplers negative 3. Pain Management: Will discontinue naprosyn with history of rhabdomyolysis. Continue neurontin 300 mg tid as well as elavil 100 mg at bedtime. Monitor for SE of medications.  4. Mood: No signs of depression or anxiety noted. LCSW to follow for evaluation and support. Medications compared with care everywhere notes ( tegretol 500 mg tid, Trazodone 100 Mg/HS prn, cogentin 1 mg tid and haldol 5mg  tid) and patient's input conflicts with records.   Continue current klonopin, Seroquel and haldol.   -will try to establish new psych follow up for post-discharge  -mood appropriate at present 5. Neuropsych: This patient is capable of making decisions on her own behalf.  6. DM type 2--poorly controlled: Hgb A1c-10.4. She reports that she used novolog tid ac and levemir 60 units at home.   -increase levemir to 24 units bid/ titrate further as indicated. 7. Urinary retention: pt refuses I/O caths  -large pvr's at times.   -add low dose urecholine, consider decreasing elavil  -out of bed to void, double voids  -continued education for patient 8. Folliculitis:  keflex/abx  for 7 total day course.  9. Renal cysts: Will need follow up with PMD on outpatient basis for workup.  10. Peripheral edema: Likely due to compression and paraparesis. Encourage elevation. TEDs. May need diuretics additionally to help with symptoms.  LOS (Days) 4 A FACE TO FACE EVALUATION WAS PERFORMED  SWARTZ,ZACHARY T 05/02/2014 8:48 AM

## 2014-05-03 ENCOUNTER — Inpatient Hospital Stay (HOSPITAL_COMMUNITY): Payer: Medicaid Other | Admitting: Occupational Therapy

## 2014-05-03 ENCOUNTER — Inpatient Hospital Stay (HOSPITAL_COMMUNITY): Payer: Medicaid Other | Admitting: Physical Therapy

## 2014-05-03 LAB — CK: Total CK: 161 U/L (ref 7–177)

## 2014-05-03 LAB — GLUCOSE, CAPILLARY
Glucose-Capillary: 167 mg/dL — ABNORMAL HIGH (ref 70–99)
Glucose-Capillary: 147 mg/dL — ABNORMAL HIGH (ref 70–99)
Glucose-Capillary: 90 mg/dL (ref 70–99)
Glucose-Capillary: 157 mg/dL — ABNORMAL HIGH (ref 70–99)

## 2014-05-03 MED ORDER — INSULIN DETEMIR 100 UNIT/ML ~~LOC~~ SOLN
26.0000 [IU] | Freq: Two times a day (BID) | SUBCUTANEOUS | Status: DC
  Administered 2014-05-03 – 2014-05-11 (×18): 26 [IU] via SUBCUTANEOUS
  Filled 2014-05-03 (×23): qty 0.26

## 2014-05-03 MED ORDER — ENOXAPARIN SODIUM 40 MG/0.4ML ~~LOC~~ SOLN
40.0000 mg | Freq: Every day | SUBCUTANEOUS | Status: DC
  Administered 2014-05-06 – 2014-05-14 (×9): 40 mg via SUBCUTANEOUS
  Filled 2014-05-03 (×11): qty 0.4

## 2014-05-03 MED ORDER — AMITRIPTYLINE HCL 50 MG PO TABS
50.0000 mg | ORAL_TABLET | Freq: Every day | ORAL | Status: DC
  Administered 2014-05-03 – 2014-05-13 (×11): 50 mg via ORAL
  Filled 2014-05-03 (×13): qty 1

## 2014-05-03 NOTE — Progress Notes (Signed)
Physical Therapy Session Note  Patient Details  Name: [name] MRN: [id number] Date of Birth: 11/30/1975  Today's Date: 05/03/2014 Time: [id number] Time Calculation (min): 57 min  Short Term Goals: Week 1:  PT Short Term Goal 1 (Week 1): Pt to perform sliding board transfers w/ MinA consistently PT Short Term Goal 2 (Week 1): Pt to perform sit to stand w/ ModA PT Short Term Goal 3 (Week 1): Pt to tolerate upright standing w/ pain <6/10 PT Short Term Goal 4 (Week 1): Pt will manage w/c parts w/ supervision PT Short Term Goal 5 (Week 1): Pt to perform bed mobility w/ MinA  Skilled Therapeutic Interventions/Progress Updates:  Pt was seen bedside in the pm. Pt transferred supine to edge of bed with head of bed elevated, side rail and S. Pt transferred edge of bed to w/c with min A. Pt transferred w/c to drop arm commode with min A. Pt transferred drop arm commode to w/c with min A. Pt transferred sit to stand with mod A. Pt propelled w/c to rehab gym about 200 with S. In gym, performed multiple sit to stands in parallel bars with min A. Pt ambulated forward/backward in parallel bars for distances of 2, 4, and 5 feet respectively. Pt propelled w/c back to room with S. Pt transferred w/c to edge of bed with min A. Pt transferred edge of bed to supine with side rail and S.    Therapy Documentation Precautions:  Precautions Precautions: Fall Restrictions Weight Bearing Restrictions: No General:   Pain: Pt c/o mild to mod pain B thighs and back.    Locomotion : Ambulation Ambulation/Gait Assistance: 4: Min assist   See FIM for current functional status  Therapy/Group: Individual Therapy  Dub Amis 05/03/2014, 2:45 PM

## 2014-05-03 NOTE — Progress Notes (Signed)
Physical Therapy Session Note  Patient Details  Name: [name] MRN: [id number] Date of Birth: Aug 03, 1976  Today's Date: 05/03/2014 Time: [id number] Time Calculation (min): 46 min  Short Term Goals: Week 1:  PT Short Term Goal 1 (Week 1): Pt to perform sliding board transfers w/ MinA consistently PT Short Term Goal 2 (Week 1): Pt to perform sit to stand w/ ModA PT Short Term Goal 3 (Week 1): Pt to tolerate upright standing w/ pain <6/10 PT Short Term Goal 4 (Week 1): Pt will manage w/c parts w/ supervision PT Short Term Goal 5 (Week 1): Pt to perform bed mobility w/ MinA  Skilled Therapeutic Interventions/Progress Updates:  Pt was seen bedside in the pm. Pt propelled w/c about 200 feet with rehab gym with S. In parallel bars, pt performed multiple stands with min to mod A. While standing focused on weighting shifting and unilateral stance. Pt able to take 2 steps back words with each foot x 1. Pt propelled w/c back to room about 200 feet with B UEs and S.    Therapy Documentation Precautions:  Precautions Precautions: Fall Restrictions Weight Bearing Restrictions: No General:   Pain: Pt c/o pain B thighs and low back.   See FIM for current functional status  Therapy/Group: Individual Therapy  Dub Amis 05/03/2014, 12:26 PM

## 2014-05-03 NOTE — Progress Notes (Signed)
Occupational Therapy Session Note  Patient Details  Name: [name] MRN: [id number] Date of Birth: January 22, 1976  Today's Date: 05/03/2014 Time: [id number] Time Calculation (min): 60 min  Skilled Therapeutic Interventions/Progress Updates: patient  Washed periarea, buttocks, and legs (assist for feet since there was no sponge here ) then sat in w/c at sink for washing and dressing UB with setup.  Patient was able to complete very good lateral rolls in bed to wash self.    Patient will benefit from her own adaptive equipment, and grandfather and paient will consider purchasing so she will be more independent.     Therapy Documentation Precautions:  Precautions Precautions: Fall Restrictions Weight Bearing Restrictions: No   Pain:3/10 did not want meds    See FIM for current functional status  Therapy/Group: Individual Therapy  Alfredia Ferguson [facility] 05/03/2014, 10:53 AM

## 2014-05-03 NOTE — Progress Notes (Signed)
Occupational Therapy Session Note  Patient Details  Name: [name] MRN: [id number] Date of Birth: 24-Mar-1976  Today's Date: 05/03/2014 Time: [id number] Time Calculation (min): 30 min   Skilled Therapeutic Interventions/Progress Updates:Second session today Wynonna returned demonstration for education on using reacher to [employer] and [employer] (Min A and cues).  As well, she worked on pre circle sitting up in bed and bilateral hip flexion to ease pulling up her legs and/or flexing forward to don pants over feet.   She was able to bring left leg back to don shorts but c/o in right quadriceps when she attempted to hip flex forward or bring her right foot/lower leg closer to don shorts.    Though she was able to complete effect lateral rolls in bed, she required Mod A to pull up her very snug pants over her bottom.    Therapy Documentation Precautions:  Precautions Precautions: Fall Restrictions Weight Bearing Restrictions: No  Pain:4/10. RN had already given pain meds   See FIM for current functional status  Therapy/Group: Individual Therapy  Alfredia Ferguson [facility] 05/03/2014, 4:34 PM

## 2014-05-03 NOTE — Progress Notes (Signed)
[facility] PH[facility]ICAL MEDICINE & REHABILITATION     PROGRESS NOTE    Subjective/Complaints: Emptying bladder better. Moved bowels. Feels better this am  Objective: Vital Signs: Blood pressure 119/72, pulse 115, temperature 97.6 F (36.4 C), temperature source Oral, resp. rate 18, weight 114.1 kg (251 lb 8.7 oz), SpO2 97.00%. No results found. No results found for this basename: WBC, HGB, HCT, PLT,  in the last 72 hours No results found for this basename: NA, K, CL, CO, GLUCOSE, BUN, CREATININE, CALCIUM,  in the last 72 hours CBG (last 3)   Recent Labs  05/02/14 1656 05/02/14 2110 05/03/14 0729  GLUCAP 203* 173* 167*    Wt Readings from Last 3 Encounters:  05/03/14 114.1 kg (251 lb 8.7 oz)  04/28/14 114.579 kg (252 lb 9.6 oz)    Physical Exam:  Constitutional: She is oriented to person, place, and time. She appears well-developed and well-nourished.    HENT:  Head: Normocephalic and atraumatic.  Eyes: Conjunctivae are normal. Pupils are equal, round, and reactive to light.  Neck: Normal range of motion. Neck supple.  Cardiovascular: Normal rate and regular rhythm.  No murmur heard.  Respiratory: Effort normal and breath sounds normal. No respiratory distress. She has no wheezes.  GI: Soft. Bowel sounds are normal. Non-distended. Has a lot of bruising from lovenox injections Musculoskeletal:  Denies any pain with attempts at ROM bilateral hips, knees and feet. 2+ pitting edema BLE from hips to feet-- proximal > distal. Evidence of healing/drying folliculitis RLE. Vesicular rash Neurological: She is alert and oriented to person, place, and time.  Follows commands without difficulty. 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  DTR's trace to absent in LE's  Psych: pt pleasant and generally cooperative.   Assessment/Plan: 1. Functional deficits  secondary to rhabdomyolysis with secondary polyneuropathy which require 3+ hours per day of interdisciplinary therapy in a comprehensive inpatient rehab setting. Physiatrist is providing close team supervision and 24 hour management of active medical problems listed below. Physiatrist and rehab team continue to assess barriers to discharge/monitor patient progress toward functional and medical goals. FIM: FIM - Bathing Bathing Steps Patient Completed: Chest;Right Arm;Left Arm;Abdomen;Right upper leg;Left upper leg;Front perineal area;Buttocks (Seated at sink and EOB with Lat leans) Bathing: 4: Min-Patient completes 8-9 3f 10 parts or 75+ percent  FIM - Upper Body Dressing/Undressing Upper body dressing/undressing steps patient completed: Thread/unthread right sleeve of pullover shirt/dresss;Thread/unthread left sleeve of pullover shirt/dress;Put head through opening of pull over shirt/dress;Pull shirt over trunk Upper body dressing/undressing: 5: Supervision: Safety issues/verbal cues (seated in w/c) FIM - Lower Body Dressing/Undressing Lower body dressing/undressing steps patient completed: Thread/unthread right pants leg;Thread/unthread left pants leg (no shoes due to edema) Lower body dressing/undressing: 1: Total-Patient completed less than 25% of tasks (EOB and Lateral leans)  FIM - Toileting Toileting steps completed by patient: Adjust clothing prior to toileting;Performs perineal hygiene;Adjust clothing after toileting Toileting: 5: Supervision: Safety issues/verbal cues  FIM - Radio producer Devices: Bedside commode Toilet Transfers: 0-Activity did not occur  FIM - Control and instrumentation engineer Devices: Bed rails;HOB elevated Bed/Chair Transfer: 3: Sit > Supine: Mod A (lifting assist/Pt. 50-74%/lift 2 legs)  FIM - Locomotion: Wheelchair Distance: 150 Locomotion: Wheelchair: 0: Activity did not occur FIM - Locomotion:  Ambulation Locomotion: Ambulation: 0: Activity did not occur  Comprehension Comprehension Mode: Auditory Comprehension: 5-Follows basic conversation/direction: With no assist  Expression Expression Mode: Verbal Expression: 5-Expresses basic needs/ideas: With no assist  Social Interaction Social Interaction: 6-Interacts appropriately with others with medication or extra time (anti-anxiety, antidepressant).  Problem Solving Problem Solving: 5-Solves basic problems: With no assist  Memory Memory: 6-More than reasonable amt of time  Medical Problem List and Plan:  1. 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 DVT Prophylaxis/Anticoagulation: Pharmaceutical: Lovenox (hold for 2 days due to bruising and abd pain)  -dopplers negative 3. Pain Management: Will discontinue naprosyn with history of rhabdomyolysis. Continue neurontin 300 mg tid as well as elavil 100 mg at bedtime. Monitor for SE of medications.  4. Mood: No signs of depression or anxiety noted. LCSW to follow for evaluation and support. Medications compared with care everywhere notes ( tegretol 500 mg tid, Trazodone 100 Mg/HS prn, cogentin 1 mg tid and haldol 5mg  tid) and patient's input conflicts with records.   Continue current klonopin, Seroquel and haldol.   -will try to establish new psych follow up for post-discharge  -mood appropriate at present 5. Neuropsych: This patient is capable of making decisions on her own behalf.  6. DM type 2--poorly controlled: Hgb A1c-10.4. She reports that she used novolog tid ac and levemir 60 units at home.   -increased levemir to 26 units bid/ titrate further as indicated. 7. Urinary retention:better yesterday  -large pvr's at times.   -added low dose urecholine,decrease elavil to 50mg   -out of bed to void, double voids  -moved bowels yesterday too 8. Folliculitis:  keflex/abx for 7 total day course.  9. Renal  cysts: Will need follow up with PMD on outpatient basis for workup.  10. Peripheral edema:  Encourage elevation. TEDs. May need diuretics additionally to help with symptoms.  LOS (Days) 5 A FACE TO FACE EVALUATION WAS PERFORMED  Raenette Sakata T 05/03/2014 7:45 AM

## 2014-05-04 ENCOUNTER — Inpatient Hospital Stay (HOSPITAL_COMMUNITY): Payer: Medicaid Other | Admitting: *Deleted

## 2014-05-04 LAB — GLUCOSE, CAPILLARY
Glucose-Capillary: 93 mg/dL (ref 70–99)
Glucose-Capillary: 89 mg/dL (ref 70–99)
Glucose-Capillary: 107 mg/dL — ABNORMAL HIGH (ref 70–99)
Glucose-Capillary: 158 mg/dL — ABNORMAL HIGH (ref 70–99)

## 2014-05-04 LAB — CK: Total CK: 196 U/L — ABNORMAL HIGH (ref 7–177)

## 2014-05-04 NOTE — Progress Notes (Signed)
Up to [facility] with Large BM. Refused supp. Slept good last night without complaint of. Getting up to drop arm BSC. Patrici Ranks A

## 2014-05-04 NOTE — Progress Notes (Signed)
[facility] PHYSICAL MEDICINE & REHABILITATION     PROGRESS NOTE    Subjective/Complaints: Had a good day/night. Happy that she walked. Appetite good, emptying bladder. Pain under reasonable control.  Objective: Vital Signs: Blood pressure 103/53, pulse 124, temperature 97.9 F (36.6 C), temperature source Oral, resp. rate 20, weight 115.713 kg (255 lb 1.6 oz), SpO2 100.00%. No results found. No results found for this basename: WBC, HGB, HCT, PLT,  in the last 72 hours No results found for this basename: NA, K, CL, CO, GLUCOSE, BUN, CREATININE, CALCIUM,  in the last 72 hours CBG (last 3)   Recent Labs  05/03/14 1616 05/03/14 2120 05/04/14 0719  GLUCAP 90 157* 93    Wt Readings from Last 3 Encounters:  05/04/14 115.713 kg (255 lb 1.6 oz)  04/28/14 114.579 kg (252 lb 9.6 oz)    Physical Exam:  Constitutional: She is oriented to person, place, and time. She appears well-developed and well-nourished.    HENT:  Head: Normocephalic and atraumatic.  Eyes: Conjunctivae are normal. Pupils are equal, round, and reactive to light.  Neck: Normal range of motion. Neck supple.  Cardiovascular: Normal rate and regular rhythm.  No murmur heard.  Respiratory: Effort normal and breath sounds normal. No respiratory distress. She has no wheezes.  GI: Soft. Bowel sounds are normal. Non-distended. Has a lot of bruising from lovenox injections Musculoskeletal:  Denies any pain with attempts at ROM bilateral hips, knees and feet. 2+ pitting edema BLE from hips to feet-- proximal > distal. Evidence of healing/drying folliculitis RLE. Vesicular rash Neurological: She is alert and oriented to person, place, and time.  Follows commands without difficulty. 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  DTR's trace to absent in LE's  Psych: pt pleasant and generally  cooperative.   Assessment/Plan: 1. 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 Physiatrist is providing close team supervision and 24 hour management of active medical problems listed below. Physiatrist and rehab team continue to assess barriers to discharge/monitor patient progress toward functional and medical goals. FIM: FIM - Bathing Bathing Steps Patient Completed: Chest;Right Arm;Left Arm;Abdomen;Front perineal area;Buttocks;Right upper leg;Left upper leg Bathing: 4: Min-Patient completes 8-9 30f 10 parts or 75+ percent  FIM - Upper Body Dressing/Undressing Upper body dressing/undressing steps patient completed: Put head through opening of pull over shirt/dress;Thread/unthread left sleeve of pullover shirt/dress;Thread/unthread right sleeve of pullover shirt/dresss;Pull shirt over trunk Upper body dressing/undressing: 5: Supervision: Safety issues/verbal cues FIM - Lower Body Dressing/Undressing Lower body dressing/undressing steps patient completed: Thread/unthread right pants leg;Thread/unthread left pants leg (no shoes due to edema) Lower body dressing/undressing: 1: Total-Patient completed less than 25% of tasks  FIM - Toileting Toileting steps completed by patient: Adjust clothing prior to toileting;Performs perineal hygiene;Adjust clothing after toileting Toileting: 5: Supervision: Safety issues/verbal cues  FIM - Radio producer Devices: Bedside commode Toilet Transfers: 5-To toilet/BSC: Supervision (verbal cues/safety issues)  FIM - Control and instrumentation engineer Devices: Bed rails;HOB elevated Bed/Chair Transfer: 5: Supine > Sit: Supervision (verbal cues/safety issues);5: Sit > Supine: Supervision (verbal cues/safety issues);4: Chair or W/C > Bed: Min A (steadying Pt. > 75%);4: Bed > Chair or  W/C: Min A (steadying Pt. > 75%)  FIM - Locomotion: Wheelchair Distance:  150 Locomotion: Wheelchair: 5: Travels 150 ft or more: maneuvers on rugs and over door sills with supervision, cueing or coaxing FIM - Locomotion: Ambulation Locomotion: Ambulation Assistive Devices: Parallel bars Ambulation/Gait Assistance: 4: Min assist Locomotion: Ambulation: 1: Travels less than 50 ft with minimal assistance (Pt.>75%)  Comprehension Comprehension Mode: Auditory Comprehension: 5-Follows basic conversation/direction: With no assist  Expression Expression Mode: Verbal Expression: 5-Expresses basic needs/ideas: With no assist  Social Interaction Social Interaction: 6-Interacts appropriately with others with medication or extra time (anti-anxiety, antidepressant).  Problem Solving Problem Solving: 5-Solves basic problems: With no assist  Memory Memory: 6-More than reasonable amt of time  Medical Problem List and Plan:  1. 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 DVT Prophylaxis/Anticoagulation: Pharmaceutical: Lovenox (hold for 2 days due to bruising and abd pain)  -dopplers negative 3. Pain Management: Will discontinue naprosyn with history of rhabdomyolysis. Continue neurontin 300 mg tid as well as elavil 100 mg at bedtime. Monitor for SE of medications.  4. Mood: No signs of depression or anxiety noted. LCSW to follow for evaluation and support. Medications compared with care everywhere notes ( tegretol 500 mg tid, Trazodone 100 Mg/HS prn, cogentin 1 mg tid and haldol 5mg  tid) and patient's input conflicts with records.   Continue current klonopin, Seroquel and haldol.   -will try to establish new psych follow up for post-discharge  -mood appropriate at present 5. Neuropsych: This patient is capable of making decisions on her own behalf.  6. DM type 2--poorly controlled: Hgb A1c-10.4. She reports that she used  novolog tid ac and levemir 60 units at home.   -increased levemir to 26 units bid/ titrate further as indicated.   -sugars better yesterday 7. Urinary retention:better yesterday  -large pvr's at times.   -continue low dose urecholine,decreased elavil to 50mg   -out of bed to void, double voids  -moved bowels   8. Folliculitis:  keflex/abx for 7 total day course.  9. Renal cysts: Will need follow up with PMD on outpatient basis for workup.  10. Peripheral edema:  Encourage elevation. TEDs. May need diuretics additionally to help with symptoms.  LOS (Days) 6 A FACE TO FACE EVALUATION WAS PERFORMED  Denya Buckingham T 05/04/2014 7:44 AM

## 2014-05-04 NOTE — Progress Notes (Signed)
Physical Therapy Session Note  Patient Details  Name: [name] MRN: [id number] Date of Birth: January 18, 1976  Today's Date: 05/04/2014 Time: 0855-1000 Time Calculation (min): 65 min   Skilled Therapeutic Interventions/Progress Updates:  Patient in bed at the beginning of therapy, agrees to participation. Patient complained on pain 6/10 in her R LE and received pain medicine from her nurse on her way to therapy gym. Transfer supine to sit with min A for LE transfer of bed, bed to w/c with max A, scooting ,verbalc cues for sequencing. W/C propulsion to and from the gym with Supervision.  In II bars 5 x sit to stand and standing x1 min each time with holding to bars . Unsupported standing x3 x 1 min.  Ther Ex: LAQ 2 x 15 , Hamstring curls 2 x 15 , marching in place 2 x 15, all exercises performed with resistance of yellow theraband , doubled for L LE. Kinetron sitting in w/c 2 x 15 steps with Each LE. Transfer to/from bed side commode with max A. Patient returned to bed also with max A, all needs within reach and parents in the room.   Therapy Documentation Precautions:  Precautions Precautions: Fall Restrictions Weight Bearing Restrictions: No Pain: Pain Assessment Pain Assessment: 0-10 Pain Score: 2  Pain Type: Acute pain Pain Location: Back Pain Orientation: Lower Pain Radiating Towards: legs Pain Descriptors / Indicators: Aching Pain Frequency: Constant Pain Onset: On-going Patients Stated Pain Goal: 3 Pain Intervention(s): Medication (See eMAR)  See FIM for current functional status  Therapy/Group: Individual Therapy  Guadlupe Spanish 05/04/2014, 12:03 PM

## 2014-05-05 ENCOUNTER — Encounter (HOSPITAL_COMMUNITY): Payer: Self-pay | Admitting: Occupational Therapy

## 2014-05-05 ENCOUNTER — Inpatient Hospital Stay (HOSPITAL_COMMUNITY): Payer: Self-pay | Admitting: Occupational Therapy

## 2014-05-05 ENCOUNTER — Inpatient Hospital Stay (HOSPITAL_COMMUNITY): Payer: Medicaid Other | Admitting: *Deleted

## 2014-05-05 DIAGNOSIS — F319 Bipolar disorder, unspecified: Secondary | ICD-10-CM

## 2014-05-05 DIAGNOSIS — G729 Myopathy, unspecified: Secondary | ICD-10-CM

## 2014-05-05 DIAGNOSIS — G8220 Paraplegia, unspecified: Secondary | ICD-10-CM

## 2014-05-05 DIAGNOSIS — G92 Toxic encephalopathy: Secondary | ICD-10-CM

## 2014-05-05 DIAGNOSIS — E119 Type 2 diabetes mellitus without complications: Secondary | ICD-10-CM

## 2014-05-05 DIAGNOSIS — G929 Unspecified toxic encephalopathy: Secondary | ICD-10-CM

## 2014-05-05 LAB — GLUCOSE, CAPILLARY
Glucose-Capillary: 104 mg/dL — ABNORMAL HIGH (ref 70–99)
Glucose-Capillary: 84 mg/dL (ref 70–99)
Glucose-Capillary: 138 mg/dL — ABNORMAL HIGH (ref 70–99)
Glucose-Capillary: 116 mg/dL — ABNORMAL HIGH (ref 70–99)

## 2014-05-05 LAB — CK: Total CK: 148 U/L (ref 7–177)

## 2014-05-05 NOTE — Progress Notes (Signed)
Occupational Therapy Session Notes  Patient Details  Name: [name] MRN: [id number] Date of Birth: January 23, 1976  Today's Date: 05/05/2014  Short Term Goals: Week 1:  OT Short Term Goal 1 (Week 1): Patient will perform UB/LB bathing with minimal assistance OT Short Term Goal 2 (Week 1): Patient will perform LB dressing with moderate assistance OT Short Term Goal 3 (Week 1): Patient will perform toilet transfers with minimal assistance using DME prn OT Short Term Goal 4 (Week 1): Patient will be educated on a BUE HEP for functional strengthening > BUEs in order to increase independence with functional mobility/transfers  Skilled Therapeutic Interventions/Progress Updates:   Session #1 [phone] - 50 Minutes Patient missed 10 minutes secondary to pain, see below Individual Therapy Patient with increased pain, no rate given; RN aware Patient received supine in bed. Patient engaged in bed mobility with supervision. Patient sat EOB and with urgency to use bathroom, patient unable to hold it for transfer and had urine incontinence seated EOB. Patient transferred EOB>w/c, therapist notified RN of needed bed linen change. Patient then sat at sink for UB/LB bathing in seated position, using reacher to doff socks & long handled sponge to wash bilateral feet. Patient performed lateral leans for peri cleansing and in order to pull pants > waist. Patient sat at sink in w/c for grooming tasks of brushing teeth and washing face. Patient propelled self from room > therapy gym with supervision. Therapist then engaged patient in therapeutic exercise focusing on sit<>stands and strengthening>BLEs. During exercise, patient with increased complaints of pain and fatigue, patient unable to fully complete exercise; therefore missing 10 minutes of skilled intervention. Patient able to propel self back to room and patient left seated in w/c with NT present changing bed linen.   Session #2 1330-1400 - 30  Minutes Individual Therapy Patient with 8/10 complaints of pain in BLEs, RN aware Patient received supine in bed. Patient engaged in bed mobility with supervision and transferred EOB>w/c with minimal assistance, therapist mainly assisting to keep bilateral feet grounded. Recommend patient have mother bring in tennis shoes to support ankles in order to help increase independence with transfers and sit<>stands; patient aware of recommendations. Patient propelled self from room > tub room and performed tub/shower transfer on/off tub transfer bench; patient min assist for this transfer with education regarding safe & effective technique. Patient worked on Landscape architect & post transfer. Patient propelled self > therapy gym and engaged in w/c pushups and HEP using theraband. At end of session, left patient seated in w/c in therapy gym waiting on PT for next therapy session.   Precautions:  Precautions Precautions: Fall Restrictions Weight Bearing Restrictions: No  See FIM for current functional status  [name] 05/05/2014, 7:27 AM

## 2014-05-05 NOTE — Progress Notes (Signed)
Physical Therapy Session Note  Patient Details  Name: [name] MRN: [id number] Date of Birth: 1975-11-25  Today's Date: 05/05/2014 Time: 1100-1155 and [id number] Time Calculation (min): 55 min and 32 min (missed 13 min)  Short Term Goals: Week 1:  PT Short Term Goal 1 (Week 1): Pt to perform sliding board transfers w/ MinA consistently PT Short Term Goal 1 - Progress (Week 1): Met (minA scooting transfer without use of slideboard) PT Short Term Goal 2 (Week 1): Pt to perform sit to stand w/ ModA PT Short Term Goal 2 - Progress (Week 1): Met (with use of // bars to pull up) PT Short Term Goal 3 (Week 1): Pt to tolerate upright standing w/ pain <6/10 PT Short Term Goal 3 - Progress (Week 1): Progressing toward goal (patient reports 6/10 in standing) PT Short Term Goal 4 (Week 1): Pt will manage w/c parts w/ supervision PT Short Term Goal 4 - Progress (Week 1): Met PT Short Term Goal 5 (Week 1): Pt to perform bed mobility w/ MinA PT Short Term Goal 5 - Progress (Week 1): Met  Skilled Therapeutic Interventions/Progress Updates:    AM Session: Patient received sitting in wheelchair. Session focused on functional transfers, w/c parts management, standing, and gait training. Patient performed all transfers via lateral/scooting transfers with minA, sit<>supine with supervision. Patient able to complete management of all w/c parts with increased time and supervision/visual cues. In parallel bars, several sit<>stands with minA, forward/retro gait 6' x2 with minA, mini squats 2x10. Patient declined to perform sit<>stands to push up from wheelchair instead of pulling up on // bars and declined 2 person sit<>stand. Patient with reports to use bathroom. Patient transferred wheelchair>BSC via scooting/lateral with modA to increase clearance. Patient able to pull down pants via lateral leans and supervision. Patient continent of bowel and also able to perform hygiene via lateral leans and supervision.  Transfer BSC>bed via scooting/lateral and minA, sit>supine with supervision. Patient left semi-reclined in bed with all needs within reach and mother present.  PM Session: Patient received sitting in wheelchair. Session focused on wheelchair mobility in controlled and home environments, functional transfers, and furniture transfers. Wheelchair mobility 20' x1 in home environment of ADL apartment (carpet and obstacle negotiation required) and >150' x1 in controlled environment with supervision, verbal cues for safety with negotiation of obstacles, uses B UEs to propel. Lateral/scoot transfer wheelchair<>couch in ADL apartment, minA wheelchair>couch, modA for couch>wheelchair; emphasis on wheelchair set up and parts management. Patient able to perform all with supervision/questioning cues. Patient requesting to end session secondary to B LE pain. Patient able to manage all w/c parts again with increased time and supervision cues. Patient returned to room and transferred wheelchair to bed with minA and sit>supine with supervision. Patient left semi-reclined in bed with all needs within reach.  Therapy Documentation Precautions:  Precautions Precautions: Fall Restrictions Weight Bearing Restrictions: No General: Amount of Missed PT Time (min): 13 Minutes Missed Time Reason: Pain Pain: Pain Assessment Pain Assessment: 0-10 Pain Score: 6  Pain Type: Acute pain Pain Location: Leg Pain Orientation: Right;Left Pain Descriptors / Indicators: Aching;Sore Pain Onset: On-going Pain Intervention(s): Repositioned;Ambulation/increased activity Multiple Pain Sites: No Locomotion : Ambulation Ambulation/Gait Assistance: 4: Min assist   See FIM for current functional status  Therapy/Group: Individual Therapy and Co-Treatment with Rec therapy during AM session  Lillia Abed. Lylie Blacklock, PT, DPT 05/05/2014, 2:44 PM

## 2014-05-05 NOTE — Progress Notes (Signed)
Refused PRAFO boots at HS. Per previous RN report, refusing supp. Slept good during night. Edema to BLE's, right> left. Up to [facility] for toileting needs, voids large amounts. [name] A

## 2014-05-05 NOTE — Progress Notes (Signed)
Recreational Therapy Assessment and Plan  Patient Details  Name: [name] MRN: [id number] Date of Birth: Apr 20, 1976 Today's Date: 05/05/2014  Rehab Potential: Good ELOS: 2 weeks   Assessment Clinical Impression: Problem List:  Patient Active Problem List    Diagnosis  Date Noted   .  Myopathy  04/28/2014   .  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  Myositis  04/23/2014   .  Cocaine abuse  04/23/2014   .  Bipolar disorder, unspecified  04/23/2014   .  Morbid obesity  04/23/2014   .  DM type 2 (diabetes mellitus, type 2)- poorly controlled  04/23/2014   .  Toxic encephalopathy  04/20/2014   .  Rhabdomyolysis  04/20/2014   .  Lactic acid acidosis  04/20/2014   .  Paraparesis of both lower limbs  04/20/2014   .  HCAP (healthcare-associated pneumonia)  04/20/2014    Past Medical History:  Past Medical History   Diagnosis  Date   .  Diabetes mellitus without complication    .  Hypertension    .  Cocaine abuse    .  Seizures    .  Chronic combined systolic and diastolic CHF (congestive heart failure)      a. ECHO 04/23/14 LV EF: 45% - 50%, mild LVH, diffuse hypokinesis. LV diastolic dysfunction, high ventricular filling pressure. Mild LA dilation.    Past Surgical History:  Past Surgical History   Procedure  Laterality  Date   .  Cholecystectomy   2005    Assessment & Plan  Clinical Impression: [name] is a 38 y.o. female smoker, with h/o DM with peripheral neuropathy, HTN, seizures, suicidal ideation--most recent 03/16/14, polysubstance abuse, multiple admissions for substance abuse as well as involuntary commitment; who was admitted on 6/28 after being found down in yard with decreased MS and inability to move legs. She was found to have elevated CK, elevated glucose, lactic acid, severely distended bladder as well as CXR concerning for aspiration. UDS positive for cocaine and patient without memory of events. Foley placed with 2.5 L from bladder and she was treated with IVF  for severe rhabdomyolysis as well as IV antibiotics of probable sepsis form aspiration PNA. CT Head/Cervical spine/and abdomen without acute abnormality. CT chest with bilateral upper lobe ground glass opacities. Dr. Arnoldo Morale recommended MRI thoraco-lumbar spine to rule out SCI. MRI toracic spine negative and lumbar spine films revealed confluent severe muscle edema in lower pelvis affecting gluteal muscle, adductors and lower lumbar erector spinae muscles--likely due to severe myositis in setting of Rhabdo.  Neurology consulted for input and felt that patient's symptoms likely due to an axonal sensory-motor neuropathy as well as a lumbosacral plexopathy due to severe compressive myositis in the setting of generalized rhabdomyolysis. NCS/EMG recommended for follow up but unavailable in hospital setting. Dr. Cathie Olden consulted for input on global hypokinesis and felt that EF c/w cocaine use and recommended follow up on outpatient basis as well as stopping use of cocaine, ETOH and tobacco. Patient was treated with IV toradol and placed on naprosyn as well as tramadol for pain management. She was started on neurontin for neuropathic symptoms. She developed folliculitis RLE and was placed on Unasyn this weekend with improvement. Foley discontinued and patient with incontinence. She has had improvement in motor strength as well as activity level. CIR was recommended by rehab team. Patient transferred to CIR on 04/28/2014.   Pt presents with decreased activity tolerance, decreased functional mobility, decreased balance Limiting pt's  independence with leisure/community pursuits.  Leisure History/Participation Leisure Participation Style: With Family/Friends Awareness of Community Resources: Poor-identify 1 post discharge leisure resource Psychosocial / Spiritual Social interaction - Mood/Behavior: Cooperative Academic librarian Appropriate for Education?: Yes Recreational Therapy Orientation Orientation  -Reviewed with patient: Available activity resources Strengths/Weaknesses Patient Strengths/Abilities: Willingness to participate Patient weaknesses: Physical limitations;Minimal Premorbid Leisure Activity TR Patient demonstrates impairments in the following area(s): Edema;Endurance;Motor;Pain TR Additional Impairment(s): Leisure Awareness  Plan Rec Therapy Plan Is patient appropriate for Therapeutic Recreation?: Yes Rehab Potential: Good Treatment times per week: Min 1 time per week >20 minutes Estimated Length of Stay: 2 weeks TR Treatment/Interventions: Adaptive equipment instruction;1:1 session;Balance/vestibular training;Community reintegration;Functional mobility training;Patient/family education;Leisure education;Recreation/leisure participation;Therapeutic activities;Therapeutic exercise;UE/LE Coordination activities;Wheelchair propulsion/positioning Recommendations for other services: Neuropsych  Recommendations for other services: Neuropsych  Discharge Criteria: Patient will be discharged from TR if patient refuses treatment 3 consecutive times without medical reason.  If treatment goals not met, if there is a change in medical status, if patient makes no progress towards goals or if patient is discharged from hospital.  The above assessment, treatment plan, treatment alternatives and goals were discussed and mutually agreed upon: by patient  [place] 05/05/2014, 4:07 PM

## 2014-05-05 NOTE — Progress Notes (Signed)
Physical Therapy Weekly Progress Note  Patient Details  Name: [name] MRN: [id number] Date of Birth: [birth year]  Beginning of progress report period: April 29, 2014 End of progress report period: May 05, 2014  Today's Date: 05/05/2014  Patient has made good progress and has met 4 of 5 short term goals.  Pt is currently supervision w/c mobility and min-mod A overall for lateral scooting transfers and standing/pre-gait in // bars.  Pt activity tolerance and participation continue to be slightly limited due to pain in bilat LE.    Patient continues to demonstrate the following deficits: bilat LE pain, impaired strength, activity tolerance and endurance, balance, gait and therefore will continue to benefit from skilled PT intervention to enhance overall performance with activity tolerance, balance, postural control and functional use of  right lower extremity and left lower extremity.  Patient progressing toward long term goals.  Plan of care revisions: Gait goals upgraded and controlled environment gait goal added; stair and car transfer goals added.  PT Short Term Goals Week 1:  PT Short Term Goal 1 (Week 1): Pt to perform sliding board transfers w/ MinA consistently PT Short Term Goal 1 - Progress (Week 1): Met (minA scooting transfer without use of slideboard) PT Short Term Goal 2 (Week 1): Pt to perform sit to stand w/ ModA PT Short Term Goal 2 - Progress (Week 1): Met (with use of // bars to pull up) PT Short Term Goal 3 (Week 1): Pt to tolerate upright standing w/ pain <6/10 PT Short Term Goal 3 - Progress (Week 1): Progressing toward goal (patient reports 6/10 in standing) PT Short Term Goal 4 (Week 1): Pt will manage w/c parts w/ supervision PT Short Term Goal 4 - Progress (Week 1): Met PT Short Term Goal 5 (Week 1): Pt to perform bed mobility w/ MinA PT Short Term Goal 5 - Progress (Week 1): Met Week 2:  PT Short Term Goal 1 (Week 2): STG = LTG    See FIM for current  functional status    Raylene Everts [facility] 05/05/2014, 8:55 AM

## 2014-05-05 NOTE — Progress Notes (Signed)
[place] PHYSICAL MEDICINE & REHABILITATION     PROGRESS NOTE    Subjective/Complaints: Having low back and right hip pain. (sore) A  review of systems has been performed and if not noted above is otherwise negative.   Objective: Vital Signs: Blood pressure 119/65, pulse 124, temperature 98.5 F (36.9 C), temperature source Oral, resp. rate 18, weight 117.1 kg (258 lb 2.5 oz), SpO2 96.00%. No results found. No results found for this basename: WBC, HGB, HCT, PLT,  in the last 72 hours No results found for this basename: NA, K, CL, CO, GLUCOSE, BUN, CREATININE, CALCIUM,  in the last 72 hours CBG (last 3)   Recent Labs  05/04/14 1626 05/04/14 2126 05/05/14 0711  GLUCAP 107* 158* 104*    Wt Readings from Last 3 Encounters:  05/05/14 117.1 kg (258 lb 2.5 oz)  04/28/14 114.579 kg (252 lb 9.6 oz)    Physical Exam:  Constitutional: She is oriented to person, place, and time. She appears well-developed and well-nourished.    HENT:  Head: Normocephalic and atraumatic.  Eyes: Conjunctivae are normal. Pupils are equal, round, and reactive to light.  Neck: Normal range of motion. Neck supple.  Cardiovascular: Normal rate and regular rhythm.  No murmur heard.  Respiratory: Effort normal and breath sounds normal. No respiratory distress. She has no wheezes.  GI: Soft. Bowel sounds are normal. Non-distended. Has a lot of bruising from lovenox injections. Less tender Musculoskeletal:  Denies any pain with attempts at ROM bilateral hips, knees and feet. 2+ pitting edema BLE from hips to feet-- proximal > distal.   RLE Vesicular rash better Neurological: She is alert and oriented to person, place, and time.  Follows commands without difficulty. 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  DTR's trace to absent in LE's  Psych: pt pleasant and generally  cooperative.   Assessment/Plan: 1. 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 Physiatrist is providing close team supervision and 24 hour management of active medical problems listed below. Physiatrist and rehab team continue to assess barriers to discharge/monitor patient progress toward functional and medical goals. FIM: FIM - Bathing Bathing Steps Patient Completed: Chest;Right Arm;Left Arm;Abdomen;Front perineal area;Buttocks;Right upper leg;Left upper leg Bathing: 4: Min-Patient completes 8-9 4f 10 parts or 75+ percent  FIM - Upper Body Dressing/Undressing Upper body dressing/undressing steps patient completed: Put head through opening of pull over shirt/dress;Thread/unthread left sleeve of pullover shirt/dress;Thread/unthread right sleeve of pullover shirt/dresss;Pull shirt over trunk Upper body dressing/undressing: 5: Supervision: Safety issues/verbal cues FIM - Lower Body Dressing/Undressing Lower body dressing/undressing steps patient completed: Thread/unthread right pants leg;Thread/unthread left pants leg (no shoes due to edema) Lower body dressing/undressing: 1: Total-Patient completed less than 25% of tasks  FIM - Toileting Toileting steps completed by patient: Adjust clothing prior to toileting;Performs perineal hygiene;Adjust clothing after toileting Toileting: 5: Supervision: Safety issues/verbal cues  FIM - Radio producer Devices: Bedside commode Toilet Transfers: 3-From toilet/BSC: Mod A (lift or lower assist);2-From toilet/BSC: Max A (lift and lower assist)  FIM - Engineer, site Assistive Devices: Bed rails;HOB elevated Bed/Chair Transfer: 4: Supine > Sit: Min A (steadying Pt. > 75%/lift 1 leg);4: Sit > Supine: Min A (steadying pt. > 75%/lift 1 leg);2: Bed >  Chair or W/C: Max A  (lift and lower assist)  FIM - Locomotion: Wheelchair Distance: 150 Locomotion: Wheelchair: 5: Travels 150 ft or more: maneuvers on rugs and over door sills with supervision, cueing or coaxing FIM - Locomotion: Ambulation Locomotion: Ambulation Assistive Devices: Parallel bars Ambulation/Gait Assistance: 4: Min assist Locomotion: Ambulation: 0: Activity did not occur  Comprehension Comprehension Mode: Auditory Comprehension: 5-Understands complex 90% of the time/Cues < 10% of the time  Expression Expression Mode: Verbal Expression: 5-Expresses complex 90% of the time/cues < 10% of the time  Social Interaction Social Interaction: 5-Interacts appropriately 90% of the time - Needs monitoring or encouragement for participation or interaction.  Problem Solving Problem Solving: 5-Solves complex 90% of the time/cues < 10% of the time  Memory Memory: 6-More than reasonable amt of time  Medical Problem List and Plan:  1. 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 DVT Prophylaxis/Anticoagulation: Pharmaceutical: Lovenox (hold for 2 days due to bruising and abd pain)  -dopplers negative 3. Pain Management:   Continue neurontin 300 mg tid as well as elavil 100 mg at bedtime. Monitor for SE of medications.   -prn tramadol 4. Mood: No signs of depression or anxiety noted. LCSW to follow for evaluation and support. Medications compared with care everywhere notes ( tegretol 500 mg tid, Trazodone 100 Mg/HS prn, cogentin 1 mg tid and haldol 5mg  tid) and patient's input conflicts with records.   Continue current klonopin, Seroquel and haldol.   -will try to establish new psych follow up for post-discharge  -mood appropriate at present 5. Neuropsych: This patient is capable of making decisions on her own behalf.  6. DM type 2--poorly controlled: Hgb A1c-10.4. She reports that she used novolog tid ac and levemir 60 units at  home.   -increased levemir to 26 units bid/ titrate further as indicated.   -sugars better  7. Urinary retention: improved  -large pvr's at times.   -continue low dose urecholine,decreased elavil to 50mg   -out of bed to void, double voids  -moved bowels   8. Folliculitis:  keflex/abx completed 9. Renal cysts: Will need follow up with PMD on outpatient basis for workup.  10. Peripheral edema:  Encourage elevation. TEDs. May need diuretics additionally to help with symptoms.  LOS (Days) 7 A FACE TO FACE EVALUATION WAS PERFORMED  SWARTZ,ZACHARY T 05/05/2014 8:03 AM

## 2014-05-06 ENCOUNTER — Inpatient Hospital Stay (HOSPITAL_COMMUNITY): Payer: Medicaid Other

## 2014-05-06 ENCOUNTER — Encounter (HOSPITAL_COMMUNITY): Payer: Self-pay | Admitting: Occupational Therapy

## 2014-05-06 ENCOUNTER — Ambulatory Visit (HOSPITAL_COMMUNITY): Payer: Self-pay | Admitting: *Deleted

## 2014-05-06 DIAGNOSIS — G8220 Paraplegia, unspecified: Secondary | ICD-10-CM

## 2014-05-06 DIAGNOSIS — G729 Myopathy, unspecified: Secondary | ICD-10-CM

## 2014-05-06 DIAGNOSIS — G92 Toxic encephalopathy: Secondary | ICD-10-CM

## 2014-05-06 DIAGNOSIS — G929 Unspecified toxic encephalopathy: Secondary | ICD-10-CM

## 2014-05-06 DIAGNOSIS — E119 Type 2 diabetes mellitus without complications: Secondary | ICD-10-CM

## 2014-05-06 DIAGNOSIS — F319 Bipolar disorder, unspecified: Secondary | ICD-10-CM

## 2014-05-06 LAB — GLUCOSE, CAPILLARY
Glucose-Capillary: 164 mg/dL — ABNORMAL HIGH (ref 70–99)
Glucose-Capillary: 95 mg/dL (ref 70–99)
Glucose-Capillary: 124 mg/dL — ABNORMAL HIGH (ref 70–99)
Glucose-Capillary: 145 mg/dL — ABNORMAL HIGH (ref 70–99)

## 2014-05-06 LAB — CK: Total CK: 132 U/L (ref 7–177)

## 2014-05-06 NOTE — Progress Notes (Signed)
Orthopedic Tech Progress Note Patient Details:  [name] [birth year] [id number] [facility] called to place brace order Patient ID: [name], female   DOB: [birth year], 38 y.o.   MRN: [id number]   [name] 05/06/2014, 3:08 PM

## 2014-05-06 NOTE — Progress Notes (Signed)
Occupational Therapy Session Notes  Patient Details  Name: [name] MRN: [id number] Date of Birth: [birth year]  Today's Date: 05/06/2014  Short Term Goals: Week 1:  OT Short Term Goal 1 (Week 1): Patient will perform UB/LB bathing with minimal assistance OT Short Term Goal 2 (Week 1): Patient will perform LB dressing with moderate assistance OT Short Term Goal 3 (Week 1): Patient will perform toilet transfers with minimal assistance using DME prn OT Short Term Goal 4 (Week 1): Patient will be educated on a BUE HEP for functional strengthening > BUEs in order to increase independence with functional mobility/transfers  Skilled Therapeutic Interventions/Progress Updates:   Session #1 [phone] - 57 Minutes Individual Therapy Patient with 7/10 pain in BLEs "It was so bad, it woke me up last night"; RN aware Patient received seated EOB with RN present setting up BSC beside bed. Therapist took over from here and educated RN on safest & most effective way to assist patient with transfers. At this time, recommending a block under bilateral feet secondary to patient's feet slip out from under her. Also recommending patient have her mother bring in some tennis shoes for ankle stabilization and to increase independence with transfers(hopefully assisting with keeping bilateral feet grounded during transfers). Patient transferred back to w/c, gathered all needed items for ADL and propelled self > tub room. Patient performed tub/shower transfer onto tub transfer bench with min assist and completed UB/LB bathing in seated position with steady assist during lateral leans for peri care. After shower, patient dried and transferred back to w/c with min assist and completed UB/LB dressing, seated. Therapist assisted with pulling pants > waist while patient performed lateral leans. Patient propelled self back to room and completed grooming tasks seated at sink in w/c. Patient independently set-up breakfast and  during breakfast, therapist assisted with donning of TEDs (while also working on BLE strengthening). At end of session, left patient seated in w/c with all needs within reach.   Session #2 [id number] 30 Minutes Co-Treatment with TR Patient with complaints of BLE pain, no rate given; patient pre-medicated Patient received seated in w/c. Patient propelled self from room > ADL apartment. Focused therapeutic activity on sit<>stands at kitchen counter, dynamic standing balance/tolerance/endurance, pain management, and overall activity tolerance/endurance. TR present and talked with patient about good decisions/healthy lifestyle choices post discharge. Patient propelled self back to room and therapist assisted patient back to bed with minimal assistance; all needs left within reach.   Precautions:  Precautions Precautions: Fall Restrictions Weight Bearing Restrictions: No  See FIM for current functional status  [name] 05/06/2014, 7:25 AM

## 2014-05-06 NOTE — Progress Notes (Signed)
Physical Therapy Session Note  Patient Details  Name: [name] MRN: [id number] Date of Birth: May 22, 1976  Today's Date: 05/06/2014 Time: 1300-1402 Time Calculation (min): 62 min  Short Term Goals: Week 1:  PT Short Term Goal 1 (Week 1): Pt to perform sliding board transfers w/ MinA consistently PT Short Term Goal 1 - Progress (Week 1): Met (minA scooting transfer without use of slideboard) PT Short Term Goal 2 (Week 1): Pt to perform sit to stand w/ ModA PT Short Term Goal 2 - Progress (Week 1): Met (with use of // bars to pull up) PT Short Term Goal 3 (Week 1): Pt to tolerate upright standing w/ pain <6/10 PT Short Term Goal 3 - Progress (Week 1): Progressing toward goal (patient reports 6/10 in standing) PT Short Term Goal 4 (Week 1): Pt will manage w/c parts w/ supervision PT Short Term Goal 4 - Progress (Week 1): Met PT Short Term Goal 5 (Week 1): Pt to perform bed mobility w/ MinA PT Short Term Goal 5 - Progress (Week 1): Met  Skilled Therapeutic Interventions/Progress Updates:  Pt. Supine in bed asleep with mother present. Pt. Awakened and agreeable to therapy. Pt. Indicates she is sleepy and tires since she just ate. Pt. Now has shoes with rubber bottoms and required max A to donn bilaterally. Therapy started in room with transfer training/technique. Room<>gym for additional NMR trainng  NMReEd: Pt. Supine>sit>EOB>squat-pivot>w/c with tactile and verbal cues for squat-pivot transfer. Pt. Requires mod-max A due to weight shifting needs; finds it difficult to fulling shift weight anteriorly. Pt. W/C mgmt mod-I for propulsion 150 feet x 2, and min A for leg rest use and LE positioning on foot rests. Transfer and w/c mgmt incorporated into NMR training for increased proprioception, weight bearing muscle activation with joint approximation in R knee; Bilat UE addressed due to c/o pins/needles when propelling w/c with the goal of increased activity tolerance for increased function in  home setting. Stairs +2A: 2 steps x 2 up/down with retrograde descending for safety. Pt. Requires Mod A for R knee buckle and hyperextension due to lacking sensation and muscle control. Hamstrings indicated for strengthening as motor control increases. Pt. Ambulated with RW +2A for 5 feet x1, 3 feet x 3, with retro walking same distances indicated. Pt. Requires +2 for safety, with tactile and verbal cues for bilateral LE movements with R>L for assistance. Pt. Donned AFO for all ambulation and stairs and Chris from [facility] will consult and identify ambulation/brace needs further.  Gym>room w/c to bed transfer with min A for lateral scooting due to fatigue. PT. Mod I for bed mobility. Max A for doffing of footwear.  Pt. C/o right hip pain 5/10 with activity/ongoing-pain subsiding with rest/relaxation in bed.   Pt. Supine in bed with HOB elevated and all needs within reach.  Therapy Documentation Precautions:  Precautions Precautions: Fall Restrictions Weight Bearing Restrictions: No  Pain: Pain Assessment Pain Assessment: 0-10 Pain Score: 5  Pain Location: Hip Pain Orientation: Right Pain Descriptors / Indicators: Aching Pain Onset: On-going Pain Intervention(s): Repositioned  Locomotion : Ambulation Ambulation/Gait Assistance: 1: +2 Total assist Wheelchair Mobility Distance: 150   See FIM for current functional status  Therapy/Group: Individual Therapy  Juluis Mire 05/06/2014, 3:37 PM

## 2014-05-06 NOTE — Progress Notes (Signed)
Recreational Therapy Session Note  Patient Details  Name: Heather VASSELL MRN: [id number] Date of Birth: 1976/07/09 Today's Date: 05/06/2014  Pain: no c/o Skilled Therapeutic Interventions/Progress Updates: Session focused on standing tolerance, sit-stands & discussion/education on healthy lifestyle choices post discharge.  Pt openly shared about her 25+year drug history & her experiences in jail/prison & how her family "takes care of everything, meals, bills, debt.".  Pt states "this has gotten my attention, I'm not gonna do crack anymore"   Discussed the importance of an appropriate support system once discharged and offered support resources to her in which she declined at this time.  Pt states, "I've made up my mind." Therapy/Group: Co-Treatment  Alontae Chaloux 05/06/2014, 12:21 PM

## 2014-05-06 NOTE — Progress Notes (Addendum)
Physical Therapy Session Note  Patient Details  Name: [name] MRN: [id number] Date of Birth: [birth year]  Today's Date: 05/06/2014 Time: 7955-8316 Time Calculation (min): 45 min  Short Term Goals: Week 1:  PT Short Term Goal 1 (Week 1): Pt to perform sliding board transfers w/ MinA consistently PT Short Term Goal 1 - Progress (Week 1): Met (minA scooting transfer without use of slideboard) PT Short Term Goal 2 (Week 1): Pt to perform sit to stand w/ ModA PT Short Term Goal 2 - Progress (Week 1): Met (with use of // bars to pull up) PT Short Term Goal 3 (Week 1): Pt to tolerate upright standing w/ pain <6/10 PT Short Term Goal 3 - Progress (Week 1): Progressing toward goal (patient reports 6/10 in standing) PT Short Term Goal 4 (Week 1): Pt will manage w/c parts w/ supervision PT Short Term Goal 4 - Progress (Week 1): Met PT Short Term Goal 5 (Week 1): Pt to perform bed mobility w/ MinA PT Short Term Goal 5 - Progress (Week 1): Met  Skilled Therapeutic Interventions/Progress Updates:  Pt. Seated in w/c performing hygiene. Agreeable to therapy. Grandfather and Grandmother present; will bring his RW for assessment for use by patient at home.  W/C mgmt: Pt. Propelled w/c 150' x 2 with bilateral UE mod I; Pt. Required assistance for foot rest mgmt at min A. PT. Performed w/c evaluation during tx and wheel chairs switched out. 20x18 indicated with other dimensions to be determined by PT.  NMReEd: Treatment focused on proprioception of BLE, muscle activation with facilitation techniques, weight shifting, and ambulation within  bars. Pt. Ambulated 10' forwards and backwards with +2A; required LE blocking and min facilitation on R due to R knee flexion weakness/inactivity, compensates with increased hip flexion bringing knee up to clear R drop foot. PT. To initiate AFO use. Pt. Performed sit<>stand at  bars with weight shifting x 4 with min-mod A for safety.  Pt. Seated in W/C with all  needs within reach and prepared for OT to return for additional tx.  Pt. Reports being "sore" in R hip/leg, see below:  Therapy Documentation Precautions:  Precautions Precautions: Fall Restrictions Weight Bearing Restrictions: No  Pain: Pain Assessment Pain Assessment: 0-10 Pain Score: 5  Pain Location: Hip Pain Orientation: Right Pain Descriptors / Indicators: Aching Pain Onset: On-going Pain Intervention(s): Repositioned  Locomotion : Ambulation Ambulation/Gait Assistance: 3: Mod assist Wheelchair Mobility Distance: 150   See FIM for current functional status  Therapy/Group: Individual Therapy  Juluis Mire 05/06/2014, 12:15 PM

## 2014-05-06 NOTE — Progress Notes (Signed)
[facility] PHYSICAL MEDICINE & REHABILITATION     PROGRESS NOTE    Subjective/Complaints: Having low back and right hip pain. (sore) A  review of systems has been performed and if not noted above is otherwise negative.   Objective: Vital Signs: Blood pressure 117/55, pulse 119, temperature 98.4 F (36.9 C), temperature source Oral, resp. rate 18, weight 116.8 kg (257 lb 8 oz), SpO2 96.00%. No results found. No results found for this basename: WBC, HGB, HCT, PLT,  in the last 72 hours No results found for this basename: NA, K, CL, CO, GLUCOSE, BUN, CREATININE, CALCIUM,  in the last 72 hours CBG (last 3)   Recent Labs  05/05/14 1624 05/05/14 2121 05/06/14 0720  GLUCAP 138* 116* 164*    Wt Readings from Last 3 Encounters:  05/06/14 116.8 kg (257 lb 8 oz)  04/28/14 114.579 kg (252 lb 9.6 oz)    Physical Exam:  Constitutional: She is oriented to person, place, and time. She appears well-developed and well-nourished.    HENT:  Head: Normocephalic and atraumatic.  Eyes: Conjunctivae are normal. Pupils are equal, round, and reactive to light.  Neck: Normal range of motion. Neck supple.  Cardiovascular: Normal rate and regular rhythm.  No murmur heard.  Respiratory: Effort normal and breath sounds normal. No respiratory distress. She has no wheezes.  GI: Soft. Bowel sounds are normal. Non-distended. Has a lot of bruising from lovenox injections. Less tender Musculoskeletal:  Denies any pain with attempts at ROM bilateral hips, knees and feet. 2+ pitting edema BLE from hips to feet-- proximal > distal.   RLE Vesicular rash better Neurological: She is alert and oriented to person, place, and time.  Follows commands without difficulty. 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  DTR's trace to absent in LE's  Psych: pt pleasant and generally  cooperative.   Assessment/Plan: 1. 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 Physiatrist is providing close team supervision and 24 hour management of active medical problems listed below. Physiatrist and rehab team continue to assess barriers to discharge/monitor patient progress toward functional and medical goals. FIM: FIM - Bathing Bathing Steps Patient Completed: Chest;Right Arm;Left Arm;Abdomen;Front perineal area;Buttocks;Right upper leg;Left upper leg Bathing: 4: Min-Patient completes 8-9 45f 10 parts or 75+ percent  FIM - Upper Body Dressing/Undressing Upper body dressing/undressing steps patient completed: Put head through opening of pull over shirt/dress;Thread/unthread left sleeve of pullover shirt/dress;Thread/unthread right sleeve of pullover shirt/dresss;Pull shirt over trunk Upper body dressing/undressing: 5: Supervision: Safety issues/verbal cues FIM - Lower Body Dressing/Undressing Lower body dressing/undressing steps patient completed: Thread/unthread right pants leg;Thread/unthread left pants leg (no shoes due to edema) Lower body dressing/undressing: 1: Total-Patient completed less than 25% of tasks  FIM - Toileting Toileting steps completed by patient: Adjust clothing prior to toileting;Performs perineal hygiene;Adjust clothing after toileting Toileting: 5: Supervision: Safety issues/verbal cues  FIM - Radio producer Devices: Bedside commode Toilet Transfers: 5-To toilet/BSC: Supervision (verbal cues/safety issues)  FIM - Control and instrumentation engineer Devices: Arm rests Bed/Chair Transfer: 5: Sit > Supine: Supervision (verbal cues/safety issues);4: Chair or W/C > Bed: Min A (steadying Pt. > 75%)  FIM - Locomotion: Wheelchair Distance: 150 Locomotion: Wheelchair: 5: Travels 150 ft  or more:  maneuvers on rugs and over door sills with supervision, cueing or coaxing FIM - Locomotion: Ambulation Locomotion: Ambulation Assistive Devices: Parallel bars Ambulation/Gait Assistance: Not tested (comment) Locomotion: Ambulation: 0: Activity did not occur  Comprehension Comprehension Mode: Auditory Comprehension: 5-Follows basic conversation/direction: With no assist  Expression Expression Mode: Verbal Expression: 5-Expresses basic needs/ideas: With no assist  Social Interaction Social Interaction: 6-Interacts appropriately with others with medication or extra time (anti-anxiety, antidepressant).  Problem Solving Problem Solving: 5-Solves basic problems: With no assist  Memory Memory: 6-More than reasonable amt of time  Medical Problem List and Plan:  1. 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 DVT Prophylaxis/Anticoagulation: Pharmaceutical: Lovenox resumed  -dopplers negative 3. Pain Management:   Continue neurontin 300 mg tid as well as elavil 100 mg at bedtime. Monitor for SE of medications.   -prn tramadol 4. Mood: No signs of depression or anxiety noted. LCSW to follow for evaluation and support. Medications compared with care everywhere notes ( tegretol 500 mg tid, Trazodone 100 Mg/HS prn, cogentin 1 mg tid and haldol 5mg  tid) and patient's input conflicts with records.   Continue current klonopin, Seroquel and haldol.   -will try to establish new psych follow up for post-discharge  -mood appropriate at present 5. Neuropsych: This patient is capable of making decisions on her own behalf.  6. DM type 2--poorly controlled: Hgb A1c-10.4. She reports that she used novolog tid ac and levemir 60 units at home.   -increased levemir to 26 units bid/ titrate further as indicated.   -sugars better  7. Urinary retention: improved   -dc urecholine  -decreased elavil to 50mg   -out of bed to void,  double voids  -moving bowels   8. Folliculitis:  keflex/abx completed 9. Renal cysts: Will need follow up with PMD on outpatient basis for workup.  10. Peripheral edema:  Encourage elevation. TEDs. consider diuretics additionally to help with symptoms.  LOS (Days) 8 A FACE TO FACE EVALUATION WAS PERFORMED  [name] T 05/06/2014 7:41 AM

## 2014-05-06 NOTE — Progress Notes (Signed)
Orthopedic Tech Progress Note Patient Details:  [name] 04-Sep-1976 [id number]  Patient ID: [name], female   DOB: 1976-09-12, 38 y.o.   MRN: [id number] Called in advanced brace order; spoke with [name], [name] 05/06/2014, 8:00 PM

## 2014-05-07 ENCOUNTER — Inpatient Hospital Stay (HOSPITAL_COMMUNITY): Payer: Self-pay | Admitting: Occupational Therapy

## 2014-05-07 ENCOUNTER — Inpatient Hospital Stay (HOSPITAL_COMMUNITY): Payer: Medicaid Other

## 2014-05-07 ENCOUNTER — Inpatient Hospital Stay (HOSPITAL_COMMUNITY): Payer: Self-pay | Admitting: *Deleted

## 2014-05-07 ENCOUNTER — Encounter (HOSPITAL_COMMUNITY): Payer: Self-pay | Admitting: Occupational Therapy

## 2014-05-07 DIAGNOSIS — G92 Toxic encephalopathy: Secondary | ICD-10-CM

## 2014-05-07 DIAGNOSIS — G929 Unspecified toxic encephalopathy: Secondary | ICD-10-CM

## 2014-05-07 DIAGNOSIS — F319 Bipolar disorder, unspecified: Secondary | ICD-10-CM

## 2014-05-07 DIAGNOSIS — E119 Type 2 diabetes mellitus without complications: Secondary | ICD-10-CM

## 2014-05-07 DIAGNOSIS — G8220 Paraplegia, unspecified: Secondary | ICD-10-CM

## 2014-05-07 DIAGNOSIS — G729 Myopathy, unspecified: Secondary | ICD-10-CM

## 2014-05-07 LAB — GLUCOSE, CAPILLARY
Glucose-Capillary: 189 mg/dL — ABNORMAL HIGH (ref 70–99)
Glucose-Capillary: 85 mg/dL (ref 70–99)
Glucose-Capillary: 157 mg/dL — ABNORMAL HIGH (ref 70–99)
Glucose-Capillary: 222 mg/dL — ABNORMAL HIGH (ref 70–99)

## 2014-05-07 LAB — CK: Total CK: 150 U/L (ref 7–177)

## 2014-05-07 MED ORDER — CLONAZEPAM 0.5 MG PO TABS: 0.5000 mg | ORAL_TABLET | Freq: Three times a day (TID) | ORAL | Status: DC

## 2014-05-07 MED ORDER — CLONAZEPAM 0.5 MG PO TABS
0.5000 mg | ORAL_TABLET | Freq: Three times a day (TID) | ORAL | Status: DC
  Administered 2014-05-07 – 2014-05-14 (×20): 0.5 mg via ORAL
  Filled 2014-05-07 (×20): qty 1

## 2014-05-07 MED ORDER — CLONAZEPAM 0.5 MG PO TABS: 0.5000 mg | ORAL_TABLET | Freq: Two times a day (BID) | ORAL | Status: DC

## 2014-05-07 NOTE — Progress Notes (Signed)
Recreational Therapy Session Note  Patient Details  Name: [name] MRN: [id number] Date of Birth: [birth year] Today's Date: 05/07/2014  Pain: c/o 10/10 HA indicating that this type of HA typicially preceedes seizures, RN made aware Skilled Therapeutic Interventions/Progress Updates: Attempted therapy session in which pt refused due to HA.  Assisted pt with scoot pivot transfer from w/c to bed with min assist.    Nataly Pacifico 05/07/2014, 5:11 PM

## 2014-05-07 NOTE — Progress Notes (Signed)
Physical Therapy Session Note  Patient Details  Name: [name] MRN: [id number] Date of Birth: 16-Oct-1976  Today's Date: 05/07/2014 Time: 1300-1310 Time Calculation (min): 10 min  Pt. Missed 45 minutes of physical therapy secondary to mother reported seizures (4) and headache pain. Pt. Denies participation and only acknowledges PTA when aroused. Pt. Simply states "not right now" when tasked to maintain physical therapy schedule and treatment.  RN notified of reports of seizures by mother.   Pt. Supine in bed with HOB slightly elevated with all needs within reach. Mother present and coached to notify nursing of pseudo seizures.  Juluis Mire 05/07/2014, 1:31 PM

## 2014-05-07 NOTE — Progress Notes (Signed)
Occupational Therapy Weekly Progress Note  Patient Details  Name: [name] MRN: [id number] Date of Birth: Jan 22, 1976  Beginning of progress report period: April 28, 2014 End of progress report period: May 07, 2014  Today's Date: 05/07/2014  Patient has met 3 of 4 short term goals.  Patient is making slow progress on CIR. Patient is supervision>independent with UB bathing & dressing tasks and requires min assist for LB bathing & max assist for LB dressing. Patient is working on functional sit<>stands with OT, she requires min>mod assist for sit<>stands at this time. Goals are set for patient to be able to perform peri care and clothing management in sit<>stand position. Patient's mother has been present during some therapy sessions, but hasn't engaged in any hands-on. Continue patient's plan of care for now, goals will be modified if needed.   Patient continues to demonstrate the following deficits: decreased independence with functional mobility/transfers, decreased independence with BADLs, increased pain, decreased activity tolerance/endurance, decreased independence with sit<>stands. Therefore, patient will continue to benefit from skilled OT intervention to enhance overall performance with BADL, iADL and Reduce care partner burden.  Patient progressing toward long term goals..  Continue plan of care.  OT Short Term Goals Week 1:  OT Short Term Goal 1 (Week 1): Patient will perform UB/LB bathing with minimal assistance OT Short Term Goal 1 - Progress (Week 1): Met OT Short Term Goal 2 (Week 1): Patient will perform LB dressing with moderate assistance OT Short Term Goal 2 - Progress (Week 1): Progressing toward goal OT Short Term Goal 3 (Week 1): Patient will perform toilet transfers with minimal assistance using DME prn OT Short Term Goal 3 - Progress (Week 1): Met OT Short Term Goal 4 (Week 1): Patient will be educated on a BUE HEP for functional strengthening > BUEs in order to  increase independence with functional mobility/transfers OT Short Term Goal 4 - Progress (Week 1): Met  Week 2:  OT Short Term Goal 1 (Week 2): Short Term Goals = Long Term Goals  Skilled Therapeutic Interventions/Progress Updates:  Balance/vestibular training;Community reintegration;Discharge planning;DME/adaptive equipment instruction;Functional mobility training;Pain management;Neuromuscular re-education;Patient/family education;Psychosocial support;Self Care/advanced ADL retraining;Skin care/wound managment;Splinting/orthotics;Therapeutic Activities;Therapeutic Exercise;UE/LE Strength taining/ROM;UE/LE Coordination activities;Wheelchair propulsion/positioning   Precautions:  Precautions Precautions: Fall Restrictions Weight Bearing Restrictions: No  Vital Signs: Therapy Vitals Temp: 98.2 F (36.8 C) Temp src: Oral Pulse Rate: 117 Resp: 18 BP: 111/75 mmHg Patient Position (if appropriate): Lying Oxygen Therapy SpO2: 95 % O2 Device: None (Room air)  See FIM for current functional status  [name] 05/07/2014, 3:31 PM

## 2014-05-07 NOTE — Progress Notes (Signed)
Social Work Patient ID: [name], female   DOB: 1976-03-02, 38 y.o.   MRN: [id number]   Met with pt, grandfather and mother yesterday to review team conference. All aware that we remain on target for 7/22 d/c with mod i w/c goals.  All deny any concerns about current status or d/c plans.  Will continue to follow for support and d/c referrals.  Lenae Wherley, LCSW

## 2014-05-07 NOTE — Progress Notes (Signed)
[facility] PHYSICAL MEDICINE & REHABILITATION     PROGRESS NOTE    Subjective/Complaints: Having low back and right hip pain still but is able to work with therapies. Brace fitting ok. A  review of systems has been performed and if not noted above is otherwise negative.   Objective: Vital Signs: Blood pressure 111/75, pulse 117, temperature 98.2 F (36.8 C), temperature source Oral, resp. rate 18, weight 117.3 kg (258 lb 9.6 oz), SpO2 95.00%. No results found. No results found for this basename: WBC, HGB, HCT, PLT,  in the last 72 hours No results found for this basename: NA, K, CL, CO, GLUCOSE, BUN, CREATININE, CALCIUM,  in the last 72 hours CBG (last 3)   Recent Labs  05/06/14 1611 05/06/14 2150 05/07/14 0712  GLUCAP 124* 145* 189*    Wt Readings from Last 3 Encounters:  05/07/14 117.3 kg (258 lb 9.6 oz)  04/28/14 114.579 kg (252 lb 9.6 oz)    Physical Exam:  Constitutional: She is oriented to person, place, and time. She appears well-developed and well-nourished.    HENT:  Head: Normocephalic and atraumatic.  Eyes: Conjunctivae are normal. Pupils are equal, round, and reactive to light.  Neck: Normal range of motion. Neck supple.  Cardiovascular: Normal rate and regular rhythm.  No murmur heard.  Respiratory: Effort normal and breath sounds normal. No respiratory distress. She has no wheezes.  GI: Soft. Bowel sounds are normal. Non-distended. Has a lot of bruising from lovenox injections. Less tender Musculoskeletal:  Denies any pain with attempts at ROM bilateral hips, knees and feet. 2+ pitting edema BLE from hips to feet-- proximal > distal.   RLE Vesicular rash better Neurological: She is alert and oriented to person, place, and time.  Follows commands without difficulty. 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  DTR's trace to absent  in LE's  Psych: pt pleasant and generally cooperative.   Assessment/Plan: 1. 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 Physiatrist is providing close team supervision and 24 hour management of active medical problems listed below. Physiatrist and rehab team continue to assess barriers to discharge/monitor patient progress toward functional and medical goals. FIM: FIM - Bathing Bathing Steps Patient Completed: Chest;Right Arm;Left Arm;Abdomen;Front perineal area;Buttocks;Right upper leg;Left upper leg;Right lower leg (including foot);Left lower leg (including foot) (using LH sponge) Bathing: 4: Steadying assist (seated, steady assist during lateral leans)  FIM - Upper Body Dressing/Undressing Upper body dressing/undressing steps patient completed: Put head through opening of pull over shirt/dress;Thread/unthread left sleeve of pullover shirt/dress;Thread/unthread right sleeve of pullover shirt/dresss;Pull shirt over trunk Upper body dressing/undressing: 7: Complete Independence: No helper FIM - Lower Body Dressing/Undressing Lower body dressing/undressing steps patient completed: Thread/unthread right pants leg;Thread/unthread left pants leg Lower body dressing/undressing: 2: Max-Patient completed 25-49% of tasks  FIM - Toileting Toileting steps completed by patient: Adjust clothing prior to toileting;Performs perineal hygiene;Adjust clothing after toileting Toileting: 5: Supervision: Safety issues/verbal cues  FIM - Radio producer Devices: Bedside commode Toilet Transfers: 4-To toilet/BSC: Min A (steadying Pt. > 75%);4-From toilet/BSC: Min A (steadying Pt. > 75%)  FIM - Bed/Chair Transfer Bed/Chair Transfer Assistive Devices: Arm rests;Bed rails Bed/Chair Transfer: 4: Sit > Supine: Min A (steadying pt. > 75%/lift 1  leg);4: Bed > Chair or W/C: Min A (steadying Pt. > 75%)  FIM - Locomotion: Wheelchair Distance: 150 Locomotion: Wheelchair: 5: Travels 150 ft or more: maneuvers on rugs and over door sills with supervision, cueing or coaxing FIM - Locomotion: Ambulation Locomotion: Ambulation Assistive Devices: Administrator Ambulation/Gait Assistance: 1: +2 Total assist Locomotion: Ambulation: 1: Two helpers  Comprehension Comprehension Mode: Auditory Comprehension: 5-Follows basic conversation/direction: With no assist  Expression Expression Mode: Verbal Expression: 5-Expresses basic needs/ideas: With no assist  Social Interaction Social Interaction: 6-Interacts appropriately with others with medication or extra time (anti-anxiety, antidepressant).  Problem Solving Problem Solving: 5-Solves basic problems: With no assist  Memory Memory: 6-More than reasonable amt of time  Medical Problem List and Plan:  1. 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 DVT Prophylaxis/Anticoagulation: Pharmaceutical: Lovenox resumed  -dopplers negative 3. Pain Management:   Continue neurontin 300 mg tid as well as elavil 50 mg at bedtime.    -prn tramadol 4. Mood: No signs of depression or anxiety noted. LCSW to follow for evaluation and support. Medications compared with care everywhere notes ( tegretol 500 mg tid, Trazodone 100 Mg/HS prn, cogentin 1 mg tid and haldol 5mg  tid) and patient's input conflicts with records.   Continue current klonopin, Seroquel and haldol.   -will try to establish new psych follow up for post-discharge  -mood appropriate at present 5. Neuropsych: This patient is capable of making decisions on her own behalf.  6. DM type 2--poorly controlled: Hgb A1c-10.4. She reports that she used novolog tid ac and levemir 60 units at home.   -increased levemir to 26 units bid/ titrate further as indicated.   -sugars better   7. Urinary retention: improved   -dc'ed urecholine  -decreased elavil to 50mg   -out of bed to void, double voids  -moving bowels   8. Folliculitis:  keflex/abx completed 9. Renal cysts: Will need follow up with PMD on outpatient basis for workup.  10. Peripheral edema:  Encourage elevation. TEDs. consider diuretics additionally to help with symptoms.  LOS (Days) 9 A FACE TO FACE EVALUATION WAS PERFORMED  SWARTZ,ZACHARY T 05/07/2014 7:47 AM

## 2014-05-07 NOTE — Progress Notes (Signed)
Occupational Therapy Session Notes  Patient Details  Name: [name] MRN: [id number] Date of Birth: 05/18/1976  Today's Date: 05/07/2014  Short Term Goals: Week 1:  OT Short Term Goal 1 (Week 1): Patient will perform UB/LB bathing with minimal assistance OT Short Term Goal 2 (Week 1): Patient will perform LB dressing with moderate assistance OT Short Term Goal 3 (Week 1): Patient will perform toilet transfers with minimal assistance using DME prn OT Short Term Goal 4 (Week 1): Patient will be educated on a BUE HEP for functional strengthening > BUEs in order to increase independence with functional mobility/transfers  Skilled Therapeutic Interventions/Progress Updates:   Session #1 0905-1000 - 55 Minutes Individual Therapy Patient with 6/10 complaints of pain in BLEs, patient pre-medicated and therapist administered ice at end of session. Patient received supine in bed asleep. Patient easy to awake and arouse. Patient engaged in bed mobility, sat EOB, then transferred into w/c using block under B feet with minimal assistance (therapist supporting ankles and feet). Therapist assisted with w/c management, and patient then maneuvered around room to gather necessary items for ADL. Patient propelled self from room>tub room for tub/shower transfer on/off tub transfer bench. UB/LB bathing completed in seated position with steady assist for peri care. Patient transferred out of tub>w/c, donned shirt while seated, threaded pants while seated, then stood at sink in order to pull pants > waist. Patient propelled self back to room for grooming tasks seated at sink in w/c. Therapist assisted with donning of bilateral TEDs and donning of shoes with AFO>RLE. At end of session, left patient seated in w/c with all needs within reach and ice > RLE(quad area) secondary to complaints of pain. In middle of session, patient with tearfulness secondary to increased pain in BLEs (per patient report); therapist  provided psychosocial support.    Session #2 Patient missed 30 Minutes of skilled OT secondary to refusal secondary to pain. Upon entering room, patient found supine in bed asleep with mother present at bedside. Patient's mother reported that patient has had "four seizures today". Therapist notified RN and PA of mother's statement. Patient woke patient and patient would barely talk with therapist, she told therapist that she had 8/10 pain in head and BLEs and patient would not open her eyes. Therapist encouraged patient to get out of bed, but patient refused. Left patient supine in bed and notified RN of missed therapy time.   Precautions:  Precautions Precautions: Fall Restrictions Weight Bearing Restrictions: No  See FIM for current functional status  Daryl Beehler 05/07/2014, 7:26 AM

## 2014-05-07 NOTE — Progress Notes (Signed)
Physical Therapy Session Note  Patient Details  Name: [name] MRN: [id number] Date of Birth: 11-20-1975  Today's Date: 05/07/2014 Time: 1040-1045 Time Calculation (min): 5 min   Pt. Denies participation in 60 minute physical therapy session secondary to headache/pain reported by pt. That she is aware is a precursor to seizure. Pt. Indicated mother on the way; and will bring her medication, then recants statement when notified therapist must report additional sources of medication while in therapy. RN and PA notified on pt. Status and  potiential source of medication/treatment not currently prescribed.  Pt. Supine in bed with HOB elevated and covers drawn up under chin. Call bell within reach and door partially opened for visual check from hallway.  Juluis Mire 05/07/2014, 11:08 AM

## 2014-05-07 NOTE — Patient Care Conference (Signed)
Inpatient RehabilitationTeam Conference and Plan of Care Update Date: 05/06/2014   Time: 2:20 PM    Patient Name: [name]      Medical Record Number: [id number]  Date of Birth: Jul 03, 1976 Sex: Female         Room/Bed: 4M10C/4M10C-01 Payor Info: Payor: MEDICAID PENDING / Plan: MEDICAID PENDING / Product Type: *No Product type* /    Admitting Diagnosis: MYOPATHY  Admit Date/Time:  04/28/2014  3:52 PM Admission Comments: No comment available   Primary Diagnosis:  Myopathy Principal Problem: Myopathy  Patient Active Problem List   Diagnosis Date Noted  . Urinary retention with incomplete bladder emptying 05/02/2014  . 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 Cocaine abuse 04/23/2014  . Bipolar disorder, unspecified 04/23/2014  . Morbid obesity 04/23/2014  . DM type 2 (diabetes mellitus, type 2)- poorly controlled 04/23/2014  . Toxic encephalopathy 04/20/2014  . 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 Lactic acid acidosis 04/20/2014  . Paraparesis of both lower limbs 04/20/2014  . HCAP (healthcare-associated pneumonia) 04/20/2014    Expected Discharge Date: Expected Discharge Date: 05/14/14  Team Members Present: Physician leading conference: Dr. Alger Simons Social Worker Present: [name], LCSW Nurse Present: Elliot Cousin, RN PT Present: Raylene Everts, Lorriane Shire, PT OT Present: Chrys Racer, Rhetta Mura, OT;Jennifer Enis Slipper, OT SLP Present: Weston Anna, SLP Other (Discipline and Name): Danne Baxter, RN [facility]) PPS Coordinator present : Daiva Nakayama, RN, CRRN;Becky Alwyn Ren, PT     Current Status/Progress Goal Weekly Team Focus  Medical   urine retention improved. bowels moving. pain gradually getting better. sugar control improving  see prior, improve activity tolerance  diabetes control, pain mgt, bladder care   Bowel/Bladder   Continent of bowel and bladder, but incontinent if she sleeps  To be continent of  bowel / bladder  Timed toileting   Swallow/Nutrition/ Hydration             ADL's   overall min assist in seated position  overall min assist in sit<>stand position  ADL retraining at shower level, sit<>stands, dynamic standing balance/tolerance/endurance, pain management, overall activity tolerance/endurance   Mobility   Min-mod  A overall  mod (I) w/c level, (s) for transfers and gait short distances, min A stairs  Decreasing assistance for transfers, gait, standing balance/endurance   Communication             Safety/Cognition/ Behavioral Observations  Mod assist with transfers  No falls or injury  mod to mini assist   Pain   No C/O pain voiced. Tramadol 100 mg is scheduled 4 times daily  pain level of 3 or less  Assess for pain q 4 hrs & PRN and intervene accordingly   Skin   Rash to rt leg, dry and red  No further skin breakdown  Skin assessment q shift and PRN    Rehab Goals Patient on target to meet rehab goals: Yes *See Care Plan and progress notes for long and short-term goals.  Barriers to Discharge: pain, anxiety, neuro deficits    Possible Resolutions to Barriers:  see prior, orthotic    Discharge Planning/Teaching Needs:  home with family to provide 24/7 assistance      Team Discussion:  contnuing to make nice gains but remains anxious at times.  Recommend AFO.  Anticipate reaching mod i w/c goals.  No concerns.  SW to refer for neuropsych consult for coping.  Revisions to Treatment Plan:  Add neuropsych consult.   Continued  Need for Acute Rehabilitation Level of Care: The patient requires daily medical management by a physician with specialized training in physical medicine and rehabilitation for the following conditions: Daily direction of a multidisciplinary physical rehabilitation program to ensure safe treatment while eliciting the highest outcome that is of practical value to the patient.: Yes Daily medical management of patient stability for increased  activity during participation in an intensive rehabilitation regime.: Yes Daily analysis of laboratory values and/or radiology reports with any subsequent need for medication adjustment of medical intervention for : Post surgical problems;Neurological problems  Jameriah Trotti 05/07/2014, 9:42 AM

## 2014-05-08 ENCOUNTER — Inpatient Hospital Stay (HOSPITAL_COMMUNITY): Payer: Medicaid Other

## 2014-05-08 ENCOUNTER — Encounter (HOSPITAL_COMMUNITY): Payer: Self-pay

## 2014-05-08 ENCOUNTER — Encounter (HOSPITAL_COMMUNITY): Payer: Self-pay | Admitting: Occupational Therapy

## 2014-05-08 ENCOUNTER — Inpatient Hospital Stay (HOSPITAL_COMMUNITY): Payer: Self-pay | Admitting: Occupational Therapy

## 2014-05-08 LAB — GLUCOSE, CAPILLARY
Glucose-Capillary: 76 mg/dL (ref 70–99)
Glucose-Capillary: 110 mg/dL — ABNORMAL HIGH (ref 70–99)
Glucose-Capillary: 128 mg/dL — ABNORMAL HIGH (ref 70–99)
Glucose-Capillary: 139 mg/dL — ABNORMAL HIGH (ref 70–99)

## 2014-05-08 MED ORDER — TRAMADOL HCL 50 MG PO TABS
50.0000 mg | ORAL_TABLET | Freq: Four times a day (QID) | ORAL | Status: DC
  Administered 2014-05-08 – 2014-05-12 (×17): 50 mg via ORAL
  Filled 2014-05-08 (×17): qty 1

## 2014-05-08 MED ORDER — MORPHINE SULFATE ER 15 MG PO TBCR
15.0000 mg | EXTENDED_RELEASE_TABLET | Freq: Two times a day (BID) | ORAL | Status: DC
  Administered 2014-05-08 – 2014-05-13 (×11): 15 mg via ORAL
  Filled 2014-05-08 (×11): qty 1

## 2014-05-08 NOTE — Progress Notes (Signed)
Physical Therapy Session Note  Patient Details  Name: [name] MRN: [id number] Date of Birth: 03/24/1976  Today's Date: 05/08/2014 Time: [id number] Time Calculation (min): 37 min  Short Term Goals: Week 2:  PT Short Term Goal 1 (Week 2): STG = LTG   Skilled Therapeutic Interventions/Progress Updates:  Pt. Supine in bed with HOB elevated; ready for physical therapy. Mother present for session.  1:1  Pt. (s) with toileting on bedside toilet bed<>toilet with assistance placing AD. Pt. Independent with pericare and all lateral movements. Pt. Utilized modified squat-pivot transfer to preference. Pt. Max A for donning shoes; (I) for doffing. W/C propulsion with BUEs 150 x 2 with (S) for safety; pt. Drifts to the left and needs extra time to correct for direction. Car transfer x 2 with demonstration, instructions prior; pt. Able to perform w/c<>car at (s) with incorporation of correct/safe technique for transfer. Mother present and able to assist with family education with teach-back for understanding. Pt. Motivated and indicates pain medication and Diet Mt. Dews are responsible for her increased ability and performance vs. Previous day participation.  Pt. Supine in bed with HOB elevated and all needs within reach. Mother present and attending to additional needs prior to departure. Mother exited room a few minutes after therapist and indicated patient was taking a nap already.  Pt. Had no complaints of elevating pain during treatment; indicated pain was well controled with time released medication provided by RN, and denies additional need for pain management currently.  Therapy Documentation Precautions:  Precautions Precautions: Fall Restrictions Weight Bearing Restrictions: No   Locomotion : Ambulation Ambulation/Gait Assistance: 3: Mod assist Wheelchair Mobility Distance: 150   See FIM for current functional status  Therapy/Group: Individual Therapy  Juluis Mire 05/08/2014, 3:46 PM

## 2014-05-08 NOTE — Progress Notes (Signed)
[facility] MEDIC[facility] & REHABILITATION     PROGRESS NOTE    Subjective/Complaints: Low back and thighs sore. Didn't participate in therapies due to pain A  review of systems has been performed and if not noted above is otherwise negative.   Objective: Vital Signs: Blood pressure 121/75, pulse 117, temperature 98.1 F (36.7 C), temperature source Oral, resp. rate 18, weight 116.8 kg (257 lb 8 oz), SpO2 94.00%. No results found. No results found for this basename: WBC, HGB, HCT, PLT,  in the last 72 hours No results found for this basename: NA, K, CL, CO, GLUCOSE, BUN, CREATININE, CALCIUM,  in the last 72 hours CBG (last 3)   Recent Labs  05/07/14 1618 05/07/14 2125 05/08/14 0723  GLUCAP 157* 222* 76    Wt Readings from Last 3 Encounters:  05/08/14 116.8 kg (257 lb 8 oz)  04/28/14 114.579 kg (252 lb 9.6 oz)    Physical Exam:  Constitutional: She is oriented to person, place, and time. She appears well-developed and well-nourished.    HENT:  Head: Normocephalic and atraumatic.  Eyes: Conjunctivae are normal. Pupils are equal, round, and reactive to light.  Neck: Normal range of motion. Neck supple.  Cardiovascular: Normal rate and regular rhythm.  No murmur heard.  Respiratory: Effort normal and breath sounds normal. No respiratory distress. She has no wheezes.  GI: Soft. Bowel sounds are normal. Non-distended. Has a lot of bruising from lovenox injections. Less tender Musculoskeletal:  Denies any pain with attempts at ROM bilateral hips, knees and feet. 2+ pitting edema BLE from hips to feet-- proximal > distal.   RLE Vesicular rash better Neurological: She is alert and oriented to person, place, and time.  Follows commands without difficulty. 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  DTR's trace to absent in LE's  Psych: pt pleasant  and generally cooperative.   Assessment/Plan: 1. 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 Physiatrist is providing close team supervision and 24 hour management of active medical problems listed below. Physiatrist and rehab team continue to assess barriers to discharge/monitor patient progress toward functional and medical goals. FIM: FIM - Bathing Bathing Steps Patient Completed: Chest;Right Arm;Left Arm;Abdomen;Front perineal area;Buttocks;Right upper leg;Left upper leg;Right lower leg (including foot);Left lower leg (including foot) (using LH sponge) Bathing: 4: Steadying assist (seated, steady assist during peri care)  FIM - Upper Body Dressing/Undressing Upper body dressing/undressing steps patient completed: Put head through opening of pull over shirt/dress;Thread/unthread left sleeve of pullover shirt/dress;Thread/unthread right sleeve of pullover shirt/dresss;Pull shirt over trunk Upper body dressing/undressing: 5: Set-up assist to: Obtain clothing/put away FIM - Lower Body Dressing/Undressing Lower body dressing/undressing steps patient completed: Thread/unthread right pants leg;Thread/unthread left pants leg (using reacher) Lower body dressing/undressing: 2: Max-Patient completed 25-49% of tasks  FIM - Toileting Toileting steps completed by patient: Adjust clothing prior to toileting;Performs perineal hygiene;Adjust clothing after toileting Toileting: 0: Activity did not occur  FIM - Radio producer Devices: Bedside commode Toilet Transfers: 0-Activity did not occur  FIM - Control and instrumentation engineer Devices: Arm rests Bed/Chair Transfer: 4: Bed > Chair or W/C: Min A (steadying Pt. > 75%);4: Chair or W/C > Bed: Min A (steadying Pt. > 75%)  FIM -  Locomotion: Wheelchair Distance:  150 Locomotion: Wheelchair: 5: Travels 150 ft or more: maneuvers on rugs and over door sills with supervision, cueing or coaxing FIM - Locomotion: Ambulation Locomotion: Ambulation Assistive Devices: Administrator Ambulation/Gait Assistance: 1: +2 Total assist Locomotion: Ambulation: 1: Two helpers  Comprehension Comprehension Mode: Auditory Comprehension: 6-Follows complex conversation/direction: With extra time/assistive device  Expression Expression Mode: Verbal Expression: 6-Expresses complex ideas: With extra time/assistive device  Social Interaction Social Interaction: 6-Interacts appropriately with others with medication or extra time (anti-anxiety, antidepressant).  Problem Solving Problem Solving: 6-Solves complex problems: With extra time  Memory Memory: 6-More than reasonable amt of time  Medical Problem List and Plan:  1. 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 DVT Prophylaxis/Anticoagulation: Pharmaceutical: Lovenox resumed  -dopplers negative 3. Pain Management:   Continue neurontin 300 mg tid as well as elavil 50 mg at bedtime.    -prn tramadol  -add LOW dose ms contin---wean potentially before dc 4. Mood: No signs of depression or anxiety noted. LCSW to follow for evaluation and support. Medications compared with care everywhere notes ( tegretol 500 mg tid, Trazodone 100 Mg/HS prn, cogentin 1 mg tid and haldol 5mg  tid) and patient's input conflicts with records.   Continue current klonopin, Seroquel and haldol.   -will try to establish new psych follow up for post-discharge  -mood appropriate at present 5. Neuropsych: This patient is capable of making decisions on her own behalf.  6. DM type 2--poorly controlled: Hgb A1c-10.4. She reports that she used novolog tid ac and levemir 60 units at home.   -increased levemir to 26 units bid/ titrate further as indicated.   -sugars better  7.  Urinary retention: improved   -dc'ed urecholine  -decreased elavil to 50mg   -out of bed to void, double voids  -moving bowels   8. Folliculitis:  keflex/abx completed 9. Renal cysts: Will need follow up with PMD on outpatient basis for workup.  10. Peripheral edema:  Encourage elevation. TEDs. consider diuretics additionally to help with symptoms.  LOS (Days) 10 A FACE TO FACE EVALUATION WAS PERFORMED  [name] T 05/08/2014 8:10 AM

## 2014-05-08 NOTE — Progress Notes (Signed)
Physical Therapy Session Note  Patient Details  Name: [name] MRN: [id number] Date of Birth: 03-18-1976  Today's Date: 05/08/2014 Time: [id number] Time Calculation (min): 59 min  Short Term Goals: Week 2:  PT Short Term Goal 1 (Week 2): STG = LTG   Skilled Therapeutic Interventions/Progress Updates:  Pt. Semi reclined in bed with grandmother present. Pt. Agreeable to therapy.  1:1  NMReEd: with patient supplied RW and reaction AFO on R LE, the treatment focused on standing balance, increased weight shifting control, anterior/posterior movements within BOS. Pt. Tolerated prolonged static standing trials: 5 minutes x 3, 3 minutes x 2 with min guard to min A for stability, safety, and verbal/tactile cues for posture alignment and BLE muscle activation. Pt. Indicated little to no joint proprioception at knee or ankle limiting control and effective proprioception. Pt. States "it's better than it use to be." during session.   Orthoic Fit/Train: Ortho Tech present and evaluated gait during pt. And therapist assisted activity. Pt. Required mod A for ambulation and steady for safety. Ortho tech to return with materials to cast for custom made orthotic at PM session.  Pt. Reports sunburn feeling in R thigh area; decreasing with rest. Pt. Indicates she's already had pain medication and it is working.  Pt. Request pass to visit aunt admitted to hospital; RN notified and making arrangements for tomorrow.  Pt. Supine in bed with HOB elevated and all needs within reach. Mother present in room attending to additional needs.   Therapy Documentation Precautions:  Precautions Precautions: Fall Restrictions Weight Bearing Restrictions: No Pain: Pain Assessment Pain Assessment: 0-10 Pain Score: 4  Pain Location: Leg Pain Orientation: Right Pain Onset: On-going Patients Stated Pain Goal: 2 Pain Intervention(s): Medication (See eMAR)  See FIM for current functional status  Therapy/Group:  Individual Therapy  Juluis Mire 05/08/2014, 12:38 PM

## 2014-05-08 NOTE — Progress Notes (Signed)
Occupational Therapy Session Notes  Patient Details  Name: [name] MRN: [id number] Date of Birth: 05/16/1976  Today's Date: 05/08/2014  Short Term Goals: Week 1:  OT Short Term Goal 1 (Week 1): Patient will perform UB/LB bathing with minimal assistance OT Short Term Goal 1 - Progress (Week 1): Met OT Short Term Goal 2 (Week 1): Patient will perform LB dressing with moderate assistance OT Short Term Goal 2 - Progress (Week 1): Progressing toward goal OT Short Term Goal 3 (Week 1): Patient will perform toilet transfers with minimal assistance using DME prn OT Short Term Goal 3 - Progress (Week 1): Met OT Short Term Goal 4 (Week 1): Patient will be educated on a BUE HEP for functional strengthening > BUEs in order to increase independence with functional mobility/transfers OT Short Term Goal 4 - Progress (Week 1): Met  Week 2:  OT Short Term Goal 1 (Week 2): Short Term Goals = Long Term Goals  Skilled Therapeutic Interventions/Progress Updates:   Session #1 [phone] - 32 Minutes Individual Therapy Patient with 6/10 complaints of pain in BLEs, patient stated she was pre-medicated Patient received supine in bed ready to work with therapist, stating she felt better today. Patient engaged in bed mobility with supervision and transferred EOB>w/c with steady assist, without use of block under feet. Patient maneuvered self around room in w/c to gather necessary items for ADL, patient using reacher to increase independence with this task from w/c level. Patient then propelled self from room> tub room, set-up w/c and transferred w/c > tub transfer bench with steady assist and min cues for correct w/c placement & w/c management. Patient completed UB/LB bathing in seated position on tub bench with supervision. From here, patient dried off and transferred back to w/c. UB/LB dressing completed in sit<>stand position. Therapist assisted with propelling patient > ADL apartment. In ADL apartment,  therapist donned bilateral TEDs and patient worked on donning bilateral shoes(right shoe with AFO) using LH shoe horn prn. Patient performed BUE strengthening HEP using theraband independently(2 sets of 10 reps for 6 exercises). Patient's grandparents present towards end of session and brought RW for patient's personal use. Patient propelled self back to room and performed grooming tasks seated at sink. At end of session, left patient seated in w/c with grandparents present and all needs within reach.   Session #2 [id number] - 65 Minutes Individual Therapy Patient with minimal complaints of pain, no rate give; RN aware Patient received supine in bed with mother present at bedside. Patient engaged in bed mobility and sat EOB in order to don bilateral shoes, R with AFO. Patient transferred EOB>w/c with close supervision; therapist educated mother on safe & effective transfer. Patient propelled self from room > elevator> 1st floor of hospital. Engaged patient in community re-entry activity in BR, focusing on toilet transfers. Patient able to perform toilet transfer in handicap stall using grab bars with min assist from therapist; again therapist educated patient's mother on safe & effective transfers. From here, patient propelled self outside and worked on w/c propulsion on uneven surfaces while also focusing on BUE strengthening in order to increase independence with functional mobility/transfers/ADLs. Patient engaged in BLE strengthening exercises in order to increase independence with sit<>stands to increase independence with standing self-care tasks. Therapist assisted patient back to 4th floor and back to room. At end of session, left patient seated in w/c with all needs within reach and mother present.   Precautions:  Precautions Precautions: Fall Restrictions Weight Bearing Restrictions: No  See FIM for current functional status  Voyd Groft 05/08/2014, 7:54 AM

## 2014-05-09 ENCOUNTER — Inpatient Hospital Stay (HOSPITAL_COMMUNITY): Payer: Medicaid Other

## 2014-05-09 ENCOUNTER — Inpatient Hospital Stay (HOSPITAL_COMMUNITY): Payer: Self-pay | Admitting: Occupational Therapy

## 2014-05-09 ENCOUNTER — Encounter (HOSPITAL_COMMUNITY): Payer: Self-pay | Admitting: Occupational Therapy

## 2014-05-09 LAB — GLUCOSE, CAPILLARY
Glucose-Capillary: 119 mg/dL — ABNORMAL HIGH (ref 70–99)
Glucose-Capillary: 79 mg/dL (ref 70–99)
Glucose-Capillary: 148 mg/dL — ABNORMAL HIGH (ref 70–99)
Glucose-Capillary: 99 mg/dL (ref 70–99)

## 2014-05-09 NOTE — Progress Notes (Signed)
Physical Therapy Session Note  Patient Details  Name: [name] MRN: [id number] Date of Birth: 1976-09-07  Today's Date: 05/09/2014 Time: [id number] Time Calculation (min): 47 min  Short Term Goals: Week 2:  PT Short Term Goal 1 (Week 2): STG = LTG   Skilled Therapeutic Interventions/Progress Updates:  Pt. Supine in bed and agreeable to therapy.  NMReEd: Pt. Ambulated 2x 5 feet with mod A, 1 x 10 feet with mod A to max A due to fatigue to improve muscle control/activation, functional gait, and improved edema control. Pt. Performed multiple static standing trials to tolerance with functional weigh shifting. Mini-step in room: Pt. At mod A to Max A for one step x 3 with RLE requiring coordination and placement by therapist for backwards/down placement. Pt. Performed toileting at Mod-I for transfers from bed<>BST, with assistance for placement of BST by therapist along with other needs.RN rewrapped R thight with ace wrap for edema control with therapist maintaining pt. In static standing to facilitate.  Pt. Supine in bed resistant to elevating feet presently. Pt. Has all needs within reach.  Therapy Documentation Precautions:  Precautions Precautions: Fall Restrictions Weight Bearing Restrictions: No Pain: Pain Assessment Pain Score: 5  Rn states pt already medicated. Locomotion : Ambulation Ambulation/Gait Assistance: 3: Mod assist   See FIM for current functional status  Therapy/Group: Individual Therapy  Juluis Mire 05/09/2014, 4:22 PM

## 2014-05-09 NOTE — Progress Notes (Signed)
Physical Therapy Session Note  Patient Details  Name: [name] MRN: [id number] Date of Birth: 1976/05/21  Today's Date: 05/09/2014 Time: [id number] Time Calculation (min): 57 min  Short Term Goals: Week 2:  PT Short Term Goal 1 (Week 2): STG = LTG   Skilled Therapeutic Interventions/Progress Updates:  Pt. Seated in w/c in gym with OT, PA attending needs. Pt. Has increased R thigh swelling with corresponding pain being addressed by PA. Pt. Agreeable to therapy, but states being tired.  NMReEd: Pt. Treatment focused on BLE proprioception and muscle activation with static standing trials with mild to moderate perturbations to increase dynamic control and weight shifting for prolonged holds. Pt. Did not tolerate increased length of activity due to increasing pain levels in RLE (especially medial portion of thigh) described as "sunburned" sensation. Pt. Unable to maintain balance for prolonged periods without therapist assistance with delayed LOB self-correction lagging physical postioning. Pt. Compensating with stronger LLE placed posteriorly with a staggered stance for balance control. Pt. Performed marching in place and ambulation with RW as tolerated at mod A for balance and safety. Pt. Performed multiple sit<>stand transfers with mini-squats incorporated into standing portion. Pt. Demonstrating w/c management with minimal BLE use for >150' without leg rests; Pt performing at (S) to mod-I depending on energy levels.  Pt. Supine in flat bed with BLE elevated on 3 pillows for edema reduction in bilateral ankle/feet. RN reports orders to wrap R thigh area where current swelling occuring per PA. Pt. Has all needs within reach and indicates she is going to sleep between therapies.      Therapy Documentation Precautions:  Precautions Precautions: Fall Restrictions Weight Bearing Restrictions: No  Pain: Pain Assessment Pain Assessment: 0-10 Pain Score: 7  Faces Pain Scale: Hurts little  more Pain Type: Neuropathic pain Pain Location: Leg Pain Orientation: Right Pain Descriptors / Indicators: Burning;Aching Pain Onset: With Activity Patients Stated Pain Goal: 4 Pain Intervention(s): RN made aware  Locomotion : Ambulation Ambulation/Gait Assistance: 2: Max Financial controller Distance: 150   See FIM for current functional status  Therapy/Group: Individual Therapy  Juluis Mire 05/09/2014, 11:37 AM

## 2014-05-09 NOTE — Plan of Care (Signed)
Problem: RH PAIN MANAGEMENT Goal: RH STG PAIN MANAGED AT OR BELOW PT'S PAIN GOAL At or below level 4  Outcome: Not Progressing Goal changed

## 2014-05-09 NOTE — Progress Notes (Signed)
[facility] PHYS[facility]IC[facility] & REHABILITATION     PROGRESS NOTE    Subjective/Complaints: Ongoing pain issues. Had questions about psych meds. Participated in therapies yesterday A  review of systems has been performed and if not noted above is otherwise negative.   Objective: Vital Signs: Blood pressure 105/68, pulse 118, temperature 98.2 F (36.8 C), temperature source Oral, resp. rate 18, weight 114.8 kg (253 lb 1.4 oz), SpO2 95.00%. No results found. No results found for this basename: WBC, HGB, HCT, PLT,  in the last 72 hours No results found for this basename: NA, K, CL, CO, GLUCOSE, BUN, CREATININE, CALCIUM,  in the last 72 hours CBG (last 3)   Recent Labs  05/08/14 1629 05/08/14 2058 05/09/14 0719  GLUCAP 128* 139* 119*    Wt Readings from Last 3 Encounters:  05/09/14 114.8 kg (253 lb 1.4 oz)  04/28/14 114.579 kg (252 lb 9.6 oz)    Physical Exam:  Constitutional: She is oriented to person, place, and time. She appears well-developed and well-nourished.    HENT:  Head: Normocephalic and atraumatic.  Eyes: Conjunctivae are normal. Pupils are equal, round, and reactive to light.  Neck: Normal range of motion. Neck supple.  Cardiovascular: Normal rate and regular rhythm.  No murmur heard.  Respiratory: Effort normal and breath sounds normal. No respiratory distress. She has no wheezes.  GI: Soft. Bowel sounds are normal. Non-distended. Has a lot of bruising from lovenox injections. Less tender Musculoskeletal:  Denies any pain with attempts at ROM bilateral hips, knees and feet. 2+ pitting edema BLE from hips to feet-- proximal > distal.   RLE Vesicular rash better Neurological: She is alert and oriented to person, place, and time.  Follows commands without difficulty. 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  DTR's trace to absent in  LE's  Psych: pt pleasant and generally cooperative.   Assessment/Plan: 1. 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 Physiatrist is providing close team supervision and 24 hour management of active medical problems listed below. Physiatrist and rehab team continue to assess barriers to discharge/monitor patient progress toward functional and medical goals. FIM: FIM - Bathing Bathing Steps Patient Completed: Chest;Right Arm;Left Arm;Abdomen;Front perineal area;Buttocks;Right upper leg;Left upper leg;Right lower leg (including foot);Left lower leg (including foot) Bathing: 5: Supervision: Safety issues/verbal cues  FIM - Upper Body Dressing/Undressing Upper body dressing/undressing steps patient completed: Put head through opening of pull over shirt/dress;Thread/unthread left sleeve of pullover shirt/dress;Thread/unthread right sleeve of pullover shirt/dresss;Pull shirt over trunk Upper body dressing/undressing: 7: Complete Independence: No helper FIM - Lower Body Dressing/Undressing Lower body dressing/undressing steps patient completed: Thread/unthread right pants leg;Thread/unthread left pants leg Lower body dressing/undressing: 2: Max-Patient completed 25-49% of tasks  FIM - Toileting Toileting steps completed by patient: Adjust clothing prior to toileting;Adjust clothing after toileting;Performs perineal hygiene Toileting Assistive Devices: Grab bar or rail for support Toileting: 5: Set-up assist to: Obtain supplies  FIM - Radio producer Devices: Bedside commode Toilet Transfers: 5-To toilet/BSC: Supervision (verbal cues/safety issues);5-From toilet/BSC: Supervision (verbal cues/safety issues)  FIM - Engineer, site Assistive Devices: Arm rests;Bed rails Bed/Chair Transfer: 5: Bed > Chair or W/C:  Supervision (verbal cues/safety issues);5: Chair or W/C > Bed: Supervision (verbal cues/safety  issues);5: Sit > Supine: Supervision (verbal cues/safety issues);5: Supine > Sit: Supervision (verbal cues/safety issues)  FIM - Locomotion: Wheelchair Distance: 150 Locomotion: Wheelchair: 5: Travels 150 ft or more: maneuvers on rugs and over door sills with supervision, cueing or coaxing FIM - Locomotion: Ambulation Locomotion: Ambulation Assistive Devices: Administrator Ambulation/Gait Assistance: 3: Mod assist Locomotion: Ambulation: 1: Travels less than 50 ft with moderate assistance (Pt: 50 - 74%)  Comprehension Comprehension Mode: Auditory Comprehension: 6-Follows complex conversation/direction: With extra time/assistive device  Expression Expression Mode: Verbal Expression: 6-Expresses complex ideas: With extra time/assistive device  Social Interaction Social Interaction: 6-Interacts appropriately with others with medication or extra time (anti-anxiety, antidepressant).  Problem Solving Problem Solving: 6-Solves complex problems: With extra time  Memory Memory: 6-More than reasonable amt of time  Medical Problem List and Plan:  1. 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 DVT Prophylaxis/Anticoagulation: Pharmaceutical: Lovenox resumed  -dopplers negative 3. Pain Management:   Continue neurontin 300 mg tid as well as elavil 50 mg at bedtime.    -prn tramadol  -added LOW dose ms contin---wean potentially before dc 4. Mood: No signs of depression or anxiety noted. LCSW to follow for evaluation and support. Medications compared with care everywhere notes ( tegretol 500 mg tid, Trazodone 100 Mg/HS prn, cogentin 1 mg tid and haldol 5mg  tid) and patient's input conflicts with records.   Continue current klonopin, Seroquel and haldol as we are doing.   -will try to establish new psych follow up for  post-discharge  -mood appropriate at present 5. Neuropsych: This patient is capable of making decisions on her own behalf.  6. DM type 2--poorly controlled: Hgb A1c-10.4. She reports that she used novolog tid ac and levemir 60 units at home.   -increased levemir to 26 units bid/ titrate further as indicated.   -sugars better  7. Urinary retention: improved   -dc'ed urecholine  -decreased elavil to 50mg   -out of bed to void, double voids  -moving bowels   8. Folliculitis:  keflex/abx completed 9. Renal cysts: Will need follow up with PMD on outpatient basis for workup.  10. Peripheral edema:  Encourage elevation. TEDs.  .  LOS (Days) 11 A FACE TO FACE EVALUATION WAS PERFORMED  Hawthorne Day [name] 05/09/2014 9:06 AM

## 2014-05-09 NOTE — Progress Notes (Signed)
Occupational Therapy Session Notes  Patient Details  Name: NEVEYAH GARZON MRN: [id number] Date of Birth: 1976-03-15  Today's Date: 05/09/2014  Short Term Goals: Week 1:  OT Short Term Goal 1 (Week 1): Patient will perform UB/LB bathing with minimal assistance OT Short Term Goal 1 - Progress (Week 1): Met OT Short Term Goal 2 (Week 1): Patient will perform LB dressing with moderate assistance OT Short Term Goal 2 - Progress (Week 1): Progressing toward goal OT Short Term Goal 3 (Week 1): Patient will perform toilet transfers with minimal assistance using DME prn OT Short Term Goal 3 - Progress (Week 1): Met OT Short Term Goal 4 (Week 1): Patient will be educated on a BUE HEP for functional strengthening > BUEs in order to increase independence with functional mobility/transfers OT Short Term Goal 4 - Progress (Week 1): Met  Week 2:  OT Short Term Goal 1 (Week 2): Short Term Goals = Long Term Goals  Skilled Therapeutic Interventions/Progress Updates:   Session #1 [phone] - 53 Minutes Individual Therapy Patient with 7/10 complaints of pain in RLE, RN aware Patient received supine in bed asleep. Patient easy to awake and arouse. Patient engaged in bed mobility, sat EOB, and transferred EOB>w/c with close supervision. Patient required min verbal cues for safe w/c set-up prior to transfer. Patient manuevered self around room, in order to gather necessary items for ADL and for grooming task of brushing teeth. From here, patient propelled self > tub room for ADL at shower level using tub transfer bench in tub/shower unit. Patient doffed clothes prior to transfer and therapist noticed redness>right thigh and patient with complaints of "hot" and "burning" sensation; therapist immediately notified RN. RN to notify PA. Patient completed UB/LB bathing in seated position with distant supervision. Patient transferred out of shower, completed donning of shirt, and pants(in sit<>stand position). Therapist  then donned bilateral TEDs and patient donned bilateral socks without AE with supervision. At end of session, RN & PA present. Therapist left patient seated in w/c in gym waiting on PT for next therapy session.   Session #2 [id number] - 25 Minutes Individual Therapy Patient with complaints of burning sensation > RLE, RN aware Patient received supine in bed with mother present. Patient engaged in bed mobility at mod I level. Patient sat EOB and worked on donning of B shoes. Tried tennis shoes, but too big. Therefore, donned slip on shoes with R AFO. Therapist educated patient's mother on EOB>w/c transfer. Patient's mother set-up w/c and provided supervision for patient to transfer. Patient propelled self from room > therapy gym and engaged in therapeutic activity focusing on sit<>stands, dynamic standing balance/tolerance/endurance (without UE support), and overall activity tolerance/endurance. Patient propelled self back to room with mother.   Precautions:  Precautions Precautions: Fall Restrictions Weight Bearing Restrictions: No  See FIM for current functional status  Necola Bluestein 05/09/2014, 7:23 AM

## 2014-05-10 ENCOUNTER — Inpatient Hospital Stay (HOSPITAL_COMMUNITY): Payer: Self-pay | Admitting: Rehabilitation

## 2014-05-10 DIAGNOSIS — R569 Unspecified convulsions: Secondary | ICD-10-CM

## 2014-05-10 DIAGNOSIS — G729 Myopathy, unspecified: Secondary | ICD-10-CM

## 2014-05-10 LAB — GLUCOSE, CAPILLARY
Glucose-Capillary: 72 mg/dL (ref 70–99)
Glucose-Capillary: 66 mg/dL — ABNORMAL LOW (ref 70–99)
Glucose-Capillary: 79 mg/dL (ref 70–99)
Glucose-Capillary: 106 mg/dL — ABNORMAL HIGH (ref 70–99)
Glucose-Capillary: 131 mg/dL — ABNORMAL HIGH (ref 70–99)

## 2014-05-10 NOTE — Progress Notes (Signed)
MATHEL BRAINARD is a 38 y.o. female August 20, 1976 [id number]  Subjective: No new complaints. No new problems. Slept well. Feeling OK.  Objective: Vital signs in last 24 hours: Temp:  [97.4 F (36.3 C)-98.2 F (36.8 C)] 97.8 F (36.6 C) (07/18 0627) Pulse Rate:  [104-118] 104 (07/18 0627) Resp:  [17-18] 17 (07/18 0627) BP: (91-125)/(54-87) 102/57 mmHg (07/18 0627) SpO2:  [98 %] 98 % (07/18 0627) Weight:  [255 lb 4.7 oz (115.8 kg)] 255 lb 4.7 oz (115.8 kg) (07/18 0500) Weight change: 2 lb 3.3 oz (1 kg) Last BM Date: 05/09/14  Intake/Output from previous day: 07/17 0701 - 07/18 0700 In: 960 [P.O.:960] Out: 700 [Urine:700] Last cbgs: CBG (last 3)   Recent Labs  05/09/14 1630 05/09/14 2055 05/10/14 0721  GLUCAP 148* 99 72     Physical Exam General: No apparent distress   HEENT: not dry Lungs: Normal effort. Lungs clear to auscultation, no crackles or wheezes. Cardiovascular: Regular rate and rhythm, no edema Abdomen: S/NT/ND; BS(+) Musculoskeletal:  unchanged Neurological: No new neurological deficits Wounds: N/A    Skin: clear  No aging changes Mental state: Alert, oriented, cooperative    Lab Results: BMET    Component Value Date/Time   NA 133* 04/29/2014 0830   K 4.8 04/29/2014 0830   CL 98 04/29/2014 0830   CO2 24 04/29/2014 0830   GLUCOSE 246* 04/29/2014 0830   BUN 14 04/29/2014 0830   CREATININE 0.63 04/29/2014 0830   CALCIUM 8.5 04/29/2014 0830   GFRNONAA >90 04/29/2014 0830   GFRAA >90 04/29/2014 0830   CBC    Component Value Date/Time   WBC 8.4 04/29/2014 0830   RBC 4.27 04/29/2014 0830   HGB 13.6 04/29/2014 0830   HCT 39.9 04/29/2014 0830   PLT 384 04/29/2014 0830   MCV 93.4 04/29/2014 0830   MCH 31.9 04/29/2014 0830   MCHC 34.1 04/29/2014 0830   RDW 13.3 04/29/2014 0830   LYMPHSABS 2.3 04/29/2014 0830   MONOABS 0.9 04/29/2014 0830   EOSABS 0.3 04/29/2014 0830   BASOSABS 0.1 04/29/2014 0830    Studies/Results: No results found.  Medications: I have reviewed the patient's  current medications.  Assessment/Plan:  1. 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 DVT Prophylaxis/Anticoagulation: Pharmaceutical: Lovenox resumed  -dopplers negative  3. Pain Management: Continue neurontin 300 mg tid as well as elavil 50 mg at bedtime.  -prn tramadol  -added LOW dose ms contin---wean potentially before dc  4. Mood: No signs of depression or anxiety noted. LCSW to follow for evaluation and support. Medications compared with care everywhere notes ( tegretol 500 mg tid, Trazodone 100 Mg/HS prn, cogentin 1 mg tid and haldol 5mg  tid) and patient's input conflicts with records. Continue current klonopin, Seroquel and haldol as we are doing.  -will try to establish new psych follow up for post-discharge  -mood appropriate at present  5. Neuropsych: This patient is capable of making decisions on her own behalf.  6. DM type 2--poorly controlled: Hgb A1c-10.4. She reports that she used novolog tid ac and levemir 60 units at home. -increased levemir to 26 units bid/ titrate further as indicated.  -sugars better  7. Urinary retention: improved  -dc'ed urecholine  -decreased elavil to 50mg   -out of bed to void, double voids  -moving bowels  8. Folliculitis: keflex/abx completed  9. Renal cysts: Will need follow up with PMD on outpatient basis for workup.  10.  Peripheral edema: Encourage elevation. TEDs.  Cont current Rx     Length of stay, days: 12  Walker Kehr , MD 05/10/2014, 9:00 AM

## 2014-05-10 NOTE — Progress Notes (Signed)
Physical Therapy Session Note  Patient Details  Name: [name] MRN: [id number] Date of Birth: 1976/06/20  Today's Date: 05/10/2014 Time: 0902-0930 Time Calculation (min): 28 min  Short Term Goals: Week 2:  PT Short Term Goal 1 (Week 2): STG = LTG   Skilled Therapeutic Interventions/Progress Updates:   Pt received lying in bed, reluctantly agreeable to therapy session.  Skilled session focused on functional transfers to varying surfaces, w/c mobility, and standing quality/tolerance to prepare for safe D/C home.  Performed level transfers via squat pivot at S level, however did require min A when transferring to love seat (w/c set up to simulate transferring to recliner) due to having to elevate over arm rest.  Performed bed mobility in ADL apt at mod I level for increased time to elevate LEs into and out of bed.  Performed w/c propulsion in controlled and carpeted environment and able to manage w/c parts at mod I level.  Once back into room, performed standing to sink and to RW to simulate tasks at home.  Requires min A for safety to stand.  Note heavy reliance of UEs during task. Pt transferred back to bed as stated above.  Left with all needs in reach.   Therapy Documentation Precautions:  Precautions Precautions: Fall Restrictions Weight Bearing Restrictions: No   Vital Signs:   Pain: Pt with c/o pain in LEs, RN provided meds prior to session.      See FIM for current functional status  Therapy/Group: Individual Therapy  Denice Bors 05/10/2014, 12:21 PM

## 2014-05-11 ENCOUNTER — Inpatient Hospital Stay (HOSPITAL_COMMUNITY): Payer: Medicaid Other | Admitting: Occupational Therapy

## 2014-05-11 DIAGNOSIS — IMO0001 Reserved for inherently not codable concepts without codable children: Secondary | ICD-10-CM

## 2014-05-11 DIAGNOSIS — G8220 Paraplegia, unspecified: Secondary | ICD-10-CM

## 2014-05-11 DIAGNOSIS — E119 Type 2 diabetes mellitus without complications: Secondary | ICD-10-CM

## 2014-05-11 DIAGNOSIS — F319 Bipolar disorder, unspecified: Secondary | ICD-10-CM

## 2014-05-11 LAB — GLUCOSE, CAPILLARY
Glucose-Capillary: 122 mg/dL — ABNORMAL HIGH (ref 70–99)
Glucose-Capillary: 123 mg/dL — ABNORMAL HIGH (ref 70–99)
Glucose-Capillary: 60 mg/dL — ABNORMAL LOW (ref 70–99)
Glucose-Capillary: 110 mg/dL — ABNORMAL HIGH (ref 70–99)
Glucose-Capillary: 196 mg/dL — ABNORMAL HIGH (ref 70–99)

## 2014-05-11 NOTE — Progress Notes (Signed)
Hypoglycemic Event  CBG: 60  Treatment: juice ; dinner tray  Symptoms: none  Follow-up CBG: Time:1742 CBG Result:110  Possible Reasons for Event: medication  Comments/MD notified:    Jillyn Ledger  Remember to initiate Hypoglycemia Order Set & complete

## 2014-05-11 NOTE — Progress Notes (Signed)
Occupational Therapy Session Note  Patient Details  Name: ROSEZELL LOVEL MRN: [id number] Date of Birth: Jul 14, 1976  Today's Date: 05/11/2014 Time: 1020-1105 Time Calculation (min): 45 min  Skilled Therapeutic Interventions/Progress Updates:  Bed to w/c transfer close S for side scoot.  ADL in w/c at sink at sink with focus on lateral leans and/or moving bottom around in chair to wash periarea & buttocks and L LE washing and dressing (she c/o that donning tredding sock on her left foot in harder but that she can do it).    Criselda Peaches at session helping as needed and intaking education.  Grandfather who raised her sat on other side of curtain - sitting by and supportive is needed.   Nelida was able to stand at sink to pull up her shorts with close S, in spite of c/o of minimal sensation in both lower extremities.    Therapy Documentation Precautions:  Precautions Precautions: Fall Restrictions Weight Bearing Restrictions: No   See FIM for current functional status  Therapy/Group: Individual Therapy  Alfredia Ferguson [facility] 05/11/2014, 12:37 PM

## 2014-05-11 NOTE — Progress Notes (Signed)
[name] is a 38 y.o. female 1976-02-01 [id number]  Subjective: No new complaints. Slept well. Feeling OK.  Objective: Vital signs in last 24 hours: Temp:  [98.2 F (36.8 C)-98.8 F (37.1 C)] 98.2 F (36.8 C) (07/19 0619) Pulse Rate:  [96-113] 113 (07/19 0619) Resp:  [18] 18 (07/19 0619) BP: (110-112)/(79-82) 112/79 mmHg (07/19 0619) SpO2:  [95 %-97 %] 97 % (07/19 0619) Weight:  [251 lb (113.853 kg)] 251 lb (113.853 kg) (07/19 0619) Weight change: -4 lb 4.7 oz (-1.947 kg) Last BM Date: 05/09/14 (refused suppository tonight )  Intake/Output from previous day: 07/18 0701 - 07/19 0700 In: 1080 [P.O.:1080] Out: -  Last cbgs: CBG (last 3)   Recent Labs  05/10/14 1625 05/10/14 2111 05/11/14 0657  GLUCAP 106* 131* 122*     Physical Exam General: No apparent distress   HEENT: not dry Lungs: Normal effort. Lungs clear to auscultation, no crackles or wheezes. Cardiovascular: Regular rate and rhythm, no edema Abdomen: S/NT/ND; BS(+) Musculoskeletal:  unchanged Neurological: No new neurological deficits Wounds: N/A    Skin: clear  No aging changes Mental state: Alert, oriented, cooperative    Lab Results: BMET    Component Value Date/Time   NA 133* 04/29/2014 0830   K 4.8 04/29/2014 0830   CL 98 04/29/2014 0830   CO2 24 04/29/2014 0830   GLUCOSE 246* 04/29/2014 0830   BUN 14 04/29/2014 0830   CREATININE 0.63 04/29/2014 0830   CALCIUM 8.5 04/29/2014 0830   GFRNONAA >90 04/29/2014 0830   GFRAA >90 04/29/2014 0830   CBC    Component Value Date/Time   WBC 8.4 04/29/2014 0830   RBC 4.27 04/29/2014 0830   HGB 13.6 04/29/2014 0830   HCT 39.9 04/29/2014 0830   PLT 384 04/29/2014 0830   MCV 93.4 04/29/2014 0830   MCH 31.9 04/29/2014 0830   MCHC 34.1 04/29/2014 0830   RDW 13.3 04/29/2014 0830   LYMPHSABS 2.3 04/29/2014 0830   MONOABS 0.9 04/29/2014 0830   EOSABS 0.3 04/29/2014 0830   BASOSABS 0.1 04/29/2014 0830    Studies/Results: No results found.  Medications: I have reviewed the patient's  current medications.  Assessment/Plan:  1. 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 DVT Prophylaxis/Anticoagulation: Pharmaceutical: Lovenox resumed  -dopplers negative  3. Pain Management: Continue neurontin 300 mg tid as well as elavil 50 mg at bedtime.  -prn tramadol  -added LOW dose ms contin---wean potentially before dc  4. Mood: No signs of depression or anxiety noted. LCSW to follow for evaluation and support. Medications compared with care everywhere notes ( tegretol 500 mg tid, Trazodone 100 Mg/HS prn, cogentin 1 mg tid and haldol 5mg  tid) and patient's input conflicts with records. Continue current klonopin, Seroquel and haldol as we are doing.  -will try to establish new psych follow up for post-discharge  -mood appropriate at present  5. Neuropsych: This patient is capable of making decisions on her own behalf.  6. DM type 2--poorly controlled: Hgb A1c-10.4. She reports that she used novolog tid ac and levemir 60 units at home. -increased levemir to 26 units bid/ titrate further as indicated.  -sugars better  7. Urinary retention: improved  -dc'ed urecholine  -decreased elavil to 50mg   -out of bed to void, double voids  -moving bowels  8. Folliculitis: keflex/abx completed  9. Renal cysts: Will need follow up with PMD on outpatient basis for workup.  10. Peripheral edema:  Encourage elevation. TEDs.  Cont Rx     Length of stay, days: [place] , MD 05/11/2014, 9:09 AM

## 2014-05-12 ENCOUNTER — Encounter (HOSPITAL_COMMUNITY): Payer: Self-pay | Admitting: Occupational Therapy

## 2014-05-12 ENCOUNTER — Inpatient Hospital Stay (HOSPITAL_COMMUNITY): Payer: Self-pay | Admitting: Occupational Therapy

## 2014-05-12 ENCOUNTER — Inpatient Hospital Stay (HOSPITAL_COMMUNITY): Payer: Medicaid Other

## 2014-05-12 LAB — GLUCOSE, CAPILLARY
Glucose-Capillary: 128 mg/dL — ABNORMAL HIGH (ref 70–99)
Glucose-Capillary: 148 mg/dL — ABNORMAL HIGH (ref 70–99)
Glucose-Capillary: 229 mg/dL — ABNORMAL HIGH (ref 70–99)
Glucose-Capillary: 192 mg/dL — ABNORMAL HIGH (ref 70–99)
Glucose-Capillary: 188 mg/dL — ABNORMAL HIGH (ref 70–99)

## 2014-05-12 MED ORDER — DIVALPROEX SODIUM ER 500 MG PO TB24: 500.0000 mg | ORAL_TABLET | Freq: Three times a day (TID) | ORAL | Status: DC

## 2014-05-12 MED ORDER — INSULIN DETEMIR 100 UNIT/ML ~~LOC~~ SOLN
24.0000 [IU] | Freq: Two times a day (BID) | SUBCUTANEOUS | Status: DC
  Administered 2014-05-12 – 2014-05-14 (×4): 24 [IU] via SUBCUTANEOUS
  Filled 2014-05-12 (×5): qty 0.24

## 2014-05-12 MED ORDER — DIVALPROEX SODIUM 500 MG PO DR TAB
500.0000 mg | DELAYED_RELEASE_TABLET | Freq: Three times a day (TID) | ORAL | Status: DC
  Administered 2014-05-12 – 2014-05-14 (×6): 500 mg via ORAL
  Filled 2014-05-12 (×9): qty 1

## 2014-05-12 NOTE — Progress Notes (Signed)
Recreational Therapy Session Note  Patient Details  Name: [name] MRN: [id number] Date of Birth: Aug 22, 1976 Today's Date: 05/12/2014  Pain: no c/o  Skilled Therapeutic Interventions/Progress Updates: Session focused on leisure education & community pursuits/education with pt & family.  Pt propelled w/c on outdoor uneven surfaces with min assist & min cues.  Discussion with pt & family about potential activities for participation post discharge & family states "we'll come up with something"  When questioning pt, pt states "I'm not gonna do anything".  Again reinforced importance of developing healthy activities for use of time with pt & family offering suggestions of simple activities.  Pt stated interest in trying, pt with little response.   Therapy/Group: Individual Therapy [name] 05/12/2014, 4:19 PM

## 2014-05-12 NOTE — Progress Notes (Signed)
Patient was in Occupational Therapy session and began to seizure. Seizure lasted approximately 60-90 seconds, then patient appeared to "pass out." Patient startled awake and began to speak in a child like voice. Patient appear coherent with no confusion and no c/o of pain. Notified Pam Love, PA-C, received verbal orders to check vitals (See attached note at 1417) and CBG (229). Will continue to monitor.

## 2014-05-12 NOTE — Progress Notes (Signed)
[facility] PHYSICAL MEDICINE & REHABILITATION     PROGRESS NOTE    Subjective/Complaints: [place] weekend. Had some hypoglycemia yesterday A  review of systems has been performed and if not noted above is otherwise negative.   Objective: Vital Signs: Blood pressure 108/75, pulse 115, temperature 98 F (36.7 C), temperature source Oral, resp. rate 19, weight 114 kg (251 lb 5.2 oz), SpO2 97.00%. No results found. No results found for this basename: WBC, HGB, HCT, PLT,  in the last 72 hours No results found for this basename: NA, K, CL, CO, GLUCOSE, BUN, CREATININE, CALCIUM,  in the last 72 hours CBG (last 3)   Recent Labs  05/11/14 1648 05/11/14 1742 05/11/14 2051  GLUCAP 60* 110* 196*    Wt Readings from Last 3 Encounters:  05/12/14 114 kg (251 lb 5.2 oz)  04/28/14 114.579 kg (252 lb 9.6 oz)    Physical Exam:  Constitutional: She is oriented to person, place, and time. She appears well-developed and well-nourished.    HENT:  Head: Normocephalic and atraumatic.  Eyes: Conjunctivae are normal. Pupils are equal, round, and reactive to light.  Neck: Normal range of motion. Neck supple.  Cardiovascular: Normal rate and regular rhythm.  No murmur heard.  Respiratory: Effort normal and breath sounds normal. No respiratory distress. She has no wheezes.  GI: Soft. Bowel sounds are normal. Non-distended. Has a lot of bruising from lovenox injections. Less tender Musculoskeletal:  Denies any pain with attempts at ROM bilateral hips, knees and feet. 2+ pitting edema BLE from hips to feet-- proximal > distal.   RLE Vesicular rash better Neurological: She is alert and oriented to person, place, and time.  Follows commands without difficulty. 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  DTR's trace to absent in LE's  Psych: pt pleasant and generally  cooperative.   Assessment/Plan: 1. 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 Physiatrist is providing close team supervision and 24 hour management of active medical problems listed below. Physiatrist and rehab team continue to assess barriers to discharge/monitor patient progress toward functional and medical goals. FIM: FIM - Bathing Bathing Steps Patient Completed: Chest;Right upper leg;Right Arm;Left upper leg;Left Arm;Right lower leg (including foot);Abdomen;Left lower leg (including foot);Front perineal area;Buttocks Bathing: 5: Supervision: Safety issues/verbal cues  FIM - Upper Body Dressing/Undressing Upper body dressing/undressing steps patient completed: Thread/unthread left sleeve of pullover shirt/dress;Thread/unthread right sleeve of pullover shirt/dresss;Put head through opening of pull over shirt/dress;Pull shirt over trunk Upper body dressing/undressing: 5: Set-up assist to: Obtain clothing/put away FIM - Lower Body Dressing/Undressing Lower body dressing/undressing steps patient completed: Thread/unthread right pants leg;Fasten/unfasten pants;Pull pants up/down;Thread/unthread left pants leg;Don/Doff right sock Lower body dressing/undressing: 4: Min-Patient completed 75 plus % of tasks  FIM - Toileting Toileting steps completed by patient: Adjust clothing prior to toileting;Adjust clothing after toileting;Performs perineal hygiene Toileting Assistive Devices: Grab bar or rail for support Toileting: 0: Activity did not occur  FIM - Radio producer Devices: Recruitment consultant Transfers: 0-Activity did not occur  FIM - Control and instrumentation engineer Devices: Arm rests;Bed rails Bed/Chair Transfer: 5: Bed > Chair or W/C: Supervision (verbal cues/safety issues);5: Chair or W/C > Bed:  Supervision (verbal cues/safety issues)  FIM - Locomotion:  Wheelchair Distance: 150 Locomotion: Wheelchair: 0: Activity did not occur FIM - Locomotion: Ambulation Locomotion: Ambulation Assistive Devices: Administrator Ambulation/Gait Assistance: 3: Mod assist Locomotion: Ambulation: 1: Travels less than 50 ft with moderate assistance (Pt: 50 - 74%)  Comprehension Comprehension Mode: Auditory Comprehension: 6-Follows complex conversation/direction: With extra time/assistive device  Expression Expression Mode: Verbal Expression: 6-Expresses complex ideas: With extra time/assistive device  Social Interaction Social Interaction: 6-Interacts appropriately with others with medication or extra time (anti-anxiety, antidepressant).  Problem Solving Problem Solving: 6-Solves complex problems: With extra time  Memory Memory: 6-More than reasonable amt of time  Medical Problem List and Plan:  1. 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 DVT Prophylaxis/Anticoagulation: Pharmaceutical: Lovenox resumed  -dopplers negative 3. Pain Management:   Continue neurontin 300 mg tid as well as elavil 50 mg at bedtime.    -prn tramadol  -added LOW dose ms contin---wean potentially before dc 4. Mood: No signs of depression or anxiety noted. LCSW to follow for evaluation and support. Medications compared with care everywhere notes ( tegretol 500 mg tid, Trazodone 100 Mg/HS prn, cogentin 1 mg tid and haldol 5mg  tid) and patient's input conflicts with records.   Continue current klonopin, Seroquel and haldol as we are doing.   -will try to establish new psych follow up for post-discharge  -mood appropriate at present 5. Neuropsych: This patient is capable of making decisions on her own behalf.  6. DM type 2--poorly controlled: Hgb A1c-10.4. She reports that she used novolog tid ac and levemir 60 units at home.   -adjust levemir  to 24 units bid given hypoglycemia/ adjust further as indicated.   -sugars better ---needs to eat consistently 7. Urinary retention: improved   -decreased elavil to 50mg   -out of bed to void, double voids  -moving bowels   8. Folliculitis:  keflex/abx completed 9. Renal cysts: Will need follow up with PMD on outpatient basis for workup.  10. Peripheral edema:  Encourage elevation. TEDs.  .  LOS (Days) 14 A FACE TO FACE EVALUATION WAS PERFORMED  Meika Earll T 05/12/2014 8:05 AM

## 2014-05-12 NOTE — Progress Notes (Signed)
Physical Therapy Session Note  Patient Details  Name: [name] MRN: [id number] Date of Birth: [birth year]  Today's Date: 05/12/2014 Time: 3643-8377 Time Calculation (min): 43 min  Short Term Goals: Week 1:  PT Short Term Goal 1 (Week 1): Pt to perform sliding board transfers w/ MinA consistently PT Short Term Goal 1 - Progress (Week 1): Met (minA scooting transfer without use of slideboard) PT Short Term Goal 2 (Week 1): Pt to perform sit to stand w/ ModA PT Short Term Goal 2 - Progress (Week 1): Met (with use of // bars to pull up) PT Short Term Goal 3 (Week 1): Pt to tolerate upright standing w/ pain <6/10 PT Short Term Goal 3 - Progress (Week 1): Progressing toward goal (patient reports 6/10 in standing) PT Short Term Goal 4 (Week 1): Pt will manage w/c parts w/ supervision PT Short Term Goal 4 - Progress (Week 1): Met PT Short Term Goal 5 (Week 1): Pt to perform bed mobility w/ MinA PT Short Term Goal 5 - Progress (Week 1): Met Week 2:  PT Short Term Goal 1 (Week 2): STG = LTG  Week 3:     Skilled Therapeutic Interventions/Progress Updates:  Pt. Semi-reclined in bed with Mother, cousin and young boy present. Pt. Has tears on face, and describes terrible pain on Left Hip radiating into thigh. RN notified prior to tx:RN provided oral medication during treatment session.  Treatment focused on bed mobility and positioning to relieve pain. Pt. Able to perform log-rolling L<>R, scooting up with use of bed rails at Mod-I. Pt. Provided ice pack with compression for pain relief.   Pt's family and pt. Provided education on AD use, advised not to allow ambulation with RW unless a therapist is present; all transfers are to be performed with W/C. Pt's family verbally accepted and confirmed understanding with emphasis on safety and health recovery of patient. Discussed need to limit sugary sodas (MTN. DEW) with change to Diet version, healthier snack availability, maintaining nutritional  needs/goals, and increasing activity with family encouragement and participation. Family indicated all ordered AD equipment scheduled to arrive tomorrow (7/21) per SW.  Pt. Semi-reclined in bed with ice pack for pain. All needs within reach with family present and attending to other needs/pain/comfort.  Therapy Documentation Precautions:  Precautions Precautions: Fall Restrictions Weight Bearing Restrictions: No  Pain: Pain Assessment Pain Assessment: 0-10 Pain Score: 8 Pain Type: Neuropathic pain Pain Location: Leg Pain Orientation: Left Pain Descriptors / Indicators: Aching, Stabbing Pain Frequency: Constant Pain Onset: On-going Patients Stated Pain Goal: 2 Pain Intervention(s): RN notified, Repositioned, Ice Pack  See FIM for current functional status  Therapy/Group: Individual Therapy  Juluis Mire 05/12/2014, 12:54 PM

## 2014-05-12 NOTE — Progress Notes (Signed)
Physical Therapy Session Note  Patient Details  Name: [name] MRN: [id number] Date of Birth: [birth year]  Today's Date: 05/12/2014 Time: 1440-1531 Time Calculation (min): 51 min  Short Term Goals: Week 1:  PT Short Term Goal 1 (Week 1): Pt to perform sliding board transfers w/ MinA consistently PT Short Term Goal 1 - Progress (Week 1): Met (minA scooting transfer without use of slideboard) PT Short Term Goal 2 (Week 1): Pt to perform sit to stand w/ ModA PT Short Term Goal 2 - Progress (Week 1): Met (with use of // bars to pull up) PT Short Term Goal 3 (Week 1): Pt to tolerate upright standing w/ pain <6/10 PT Short Term Goal 3 - Progress (Week 1): Progressing toward goal (patient reports 6/10 in standing) PT Short Term Goal 4 (Week 1): Pt will manage w/c parts w/ supervision PT Short Term Goal 4 - Progress (Week 1): Met PT Short Term Goal 5 (Week 1): Pt to perform bed mobility w/ MinA PT Short Term Goal 5 - Progress (Week 1): Met Week 2:  PT Short Term Goal 1 (Week 2): STG = LTG   Skilled Therapeutic Interventions/Progress Updates:  Pt. Seated in w/c. Reports having a seizure like episode. Pt. States she is feeling better after resting and wants to perform physical therapy session.   Session focused on m/c management education with patient and family members. Pt. Performed all task with ramp/grade changes, uncontrolled/irregular surfaces, and in community setting; avoided obstacles, people, and displayed appropriate planning. Pt. Required Min A for ramp and occasional verbal cue for w/c velocity and proper break use. Family (mother) performed propulsion during explanation, demonstration and teach-back of going up/down and proper technique (including appropriate shoe wear; not sandals/flip-flops).  Pt. Seated in w/c post tx with all needs within reach. Family (mother, cousin, and distant youth relative) present in room and able to meet any additional needs of patient.  Therapy  Documentation Precautions:  Precautions Precautions: Fall Restrictions Weight Bearing Restrictions: No General: Amount of Missed PT Time (min): 9 Minutes Missed Time Reason: Patient ill (comment) (Pt. felt unwell; then felt better and treatment proceeded) Pain: Pain Assessment Pain Score: 4  Locomotion : Wheelchair Mobility Distance: 350   See FIM for current functional status  Therapy/Group: Individual Therapy  Juluis Mire 05/12/2014, 4:05 PM

## 2014-05-12 NOTE — Progress Notes (Addendum)
05/12/14 1417  Vitals  Temp 98.2 F (36.8 C)  Temp src Oral  BP 113/78 mmHg  BP Location Right arm  BP Method Automatic  Patient Position (if appropriate) Sitting  Pulse Rate ! 109  Pulse Rate Source Dinamap  Resp 18  Oxygen Therapy  SpO2 99 %  O2 Device None (Room air)   Post Seizure vital signs.

## 2014-05-12 NOTE — Progress Notes (Signed)
Occupational Therapy Session Note  Patient Details  Name: BAR STEINBERGER MRN: [id number] Date of Birth: 03/19/1976  Today's Date: 05/12/2014 Time: [id number] and [id number] Time Calculation (min): 55 min and 29 min  Short Term Goals: Week 2:  OT Short Term Goal 1 (Week 2): Short Term Goals = Long Term Goals  Skilled Therapeutic Interventions/Progress Updates:    1) Engaged in ADL retraining with focus on safety with squat/scoot transfers and increased independence with self-care tasks of bathing and dressing.  Pt in bed upon arrival, performed scoot transfer to drop arm BSC with supervision.  Pt completed toileting tasks with distant supervision including lateral leans to pull pants over hips.  Completed bathing at seated level at sink with lateral leans for hygiene.  With LB dressing, pt requested assistance with pulling pants over hips in standing.  Conducted tub/shower transfer in tub room with supervision with pt performing squat/scoot w/c <> tub transfer bench.  Pt's family arrived at end of session, plan to conduct family education during PM session and again tomorrow prior to D/C Weds.  2) Engaged in family education with mother and cousin regarding safety with squat/scoot pivot transfers from w/c level as PT not recommending ambulation at this time.  Performed scoot pivot transfer w/c <> tub bench with pt able to setup w/c. Discussion regarding bathroom setup, became apparent that bathroom is not w/c accessible.  Plan to complete bathing at seated level at sink and recommend use of drop arm BSC.  Educated on simple meal prep with proper positioning of w/c to access items and reorganizing kitchen to allow for things to be accessible from w/c level as not recommending any standing at this time, pt and family report understanding.  Pt reports having a bad day and bad headache coming on then freezing up/zoning out. Family asking pt if she was about to have a seizure, followed by pt beginning to  convulse.  Approx 1-1.5 mins passed before pt came to and then began speaking in childlike manner, which pt's family reports is typical. She then reports fatigue and requesting to return to room.  Therapy Documentation Precautions:  Precautions Precautions: Fall Restrictions Weight Bearing Restrictions: No Pain: Pain Assessment Pain Assessment: 0-10 Pain Score: 8  Pain Type: Neuropathic pain Pain Location: Leg Pain Orientation: Right;Left Pain Descriptors / Indicators: Aching Pain Frequency: Intermittent Pain Onset: On-going Patients Stated Pain Goal: 2 Pain Intervention(s): Medication (See eMAR)  See FIM for current functional status  Therapy/Group: Individual Therapy  Simonne Come 05/12/2014, 10:43 AM

## 2014-05-13 ENCOUNTER — Inpatient Hospital Stay (HOSPITAL_COMMUNITY): Payer: Medicaid Other

## 2014-05-13 ENCOUNTER — Encounter (HOSPITAL_COMMUNITY): Payer: Self-pay | Admitting: Occupational Therapy

## 2014-05-13 ENCOUNTER — Inpatient Hospital Stay (HOSPITAL_COMMUNITY): Payer: Self-pay

## 2014-05-13 ENCOUNTER — Inpatient Hospital Stay (HOSPITAL_COMMUNITY): Payer: Medicaid Other | Admitting: Physical Therapy

## 2014-05-13 DIAGNOSIS — G929 Unspecified toxic encephalopathy: Secondary | ICD-10-CM

## 2014-05-13 DIAGNOSIS — N2889 Other specified disorders of kidney and ureter: Secondary | ICD-10-CM | POA: Diagnosis present

## 2014-05-13 DIAGNOSIS — G729 Myopathy, unspecified: Secondary | ICD-10-CM

## 2014-05-13 DIAGNOSIS — G8220 Paraplegia, unspecified: Secondary | ICD-10-CM

## 2014-05-13 DIAGNOSIS — E119 Type 2 diabetes mellitus without complications: Secondary | ICD-10-CM

## 2014-05-13 DIAGNOSIS — F319 Bipolar disorder, unspecified: Secondary | ICD-10-CM

## 2014-05-13 DIAGNOSIS — G92 Toxic encephalopathy: Secondary | ICD-10-CM

## 2014-05-13 LAB — GLUCOSE, CAPILLARY
Glucose-Capillary: 115 mg/dL — ABNORMAL HIGH (ref 70–99)
Glucose-Capillary: 169 mg/dL — ABNORMAL HIGH (ref 70–99)
Glucose-Capillary: 103 mg/dL — ABNORMAL HIGH (ref 70–99)
Glucose-Capillary: 160 mg/dL — ABNORMAL HIGH (ref 70–99)

## 2014-05-13 MED ORDER — MORPHINE SULFATE ER 15 MG PO TBCR
15.0000 mg | EXTENDED_RELEASE_TABLET | Freq: Every day | ORAL | Status: DC
  Administered 2014-05-14: 15 mg via ORAL
  Filled 2014-05-13: qty 1

## 2014-05-13 MED ORDER — HYDROCODONE-ACETAMINOPHEN 5-325 MG PO TABS
1.0000 | ORAL_TABLET | Freq: Four times a day (QID) | ORAL | Status: DC | PRN
Start: 2014-05-13 — End: 2014-05-14
  Administered 2014-05-13 – 2014-05-14 (×3): 1 via ORAL
  Filled 2014-05-13 (×3): qty 1

## 2014-05-13 NOTE — Patient Care Conference (Signed)
Inpatient RehabilitationTeam Conference and Plan of Care Update Date: 05/14/2014   Time: 2:10 PM    Patient Name: [name]      Medical Record Number: [id number]  Date of Birth: Jul 29, 1976 Sex: Female         Room/Bed: 4M10C/4M10C-01 Payor Info: Payor: MEDICAID PENDING / Plan: MEDICAID PENDING / Product Type: *No Product type* /    Admitting Diagnosis: MYOPATHY  Admit Date/Time:  04/28/2014  3:52 PM Admission Comments: No comment available   Primary Diagnosis:  Myopathy Principal Problem: Myopathy  Patient Active Problem List   Diagnosis Date Noted  . 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 Urinary retention with incomplete bladder emptying 05/02/2014  . 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 Cocaine abuse 04/23/2014  . Bipolar disorder, unspecified 04/23/2014  . Morbid obesity 04/23/2014  . DM type 2 (diabetes mellitus, type 2)- poorly controlled 04/23/2014  . Toxic encephalopathy 04/20/2014  . 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 Lactic acid acidosis 04/20/2014  . Paraparesis of both lower limbs 04/20/2014  . HCAP (healthcare-associated pneumonia) 04/20/2014    Expected Discharge Date: Expected Discharge Date: 05/14/14  Team Members Present: Physician leading conference: Dr. Alger Simons Social Worker Present: [name], LCSW Nurse Present: Elliot Cousin, RN PT Present: Guilford Shi, PT OT Present: Salome Spotted, OT Other (Discipline and Name): Danne Baxter, RN [facility]) PPS Coordinator present : Daiva Nakayama, RN, CRRN;Becky Alwyn Ren, PT     Current Status/Progress Goal Weekly Team Focus  Medical   pseudoseizure, pain control  se prior  pain mgt plan, wean medication   Bowel/Bladder   continent of bowel and bladder, LBM 7/20  no incontinent episodes, maintain modified independence  bsc at bedside for pt use without assistance   Swallow/Nutrition/ Hydration             ADL's   overall setup/supervision - min A for BADLs   overall min assist in sit<>stand position  discharge home   Mobility   Mod I bed mobility, transfers, and w/c propulsion  Mod I bed mobility, w/c level; s' transfers and gait  Pt to d/c tomorrow   Communication             Safety/Cognition/ Behavioral Observations            Pain   minimal pain, ultram d/c, morphine er q12h, tylenol q4h prn  pain level of 3 or less  assess pain q shfit and prn and address accordingly    Skin   cdi  No further skin breakdown  assess skin q shift    Rehab Goals Patient on target to meet rehab goals: Yes *See Care Plan and progress notes for long and short-term goals.  Barriers to Discharge: see prior    Possible Resolutions to Barriers:  mod I goals, rx anxiety and pain    Discharge Planning/Teaching Needs:  home with family to provide 24/7 assistance      Team Discussion:  Ready for d/c tomorrow.  Team aware, per SW, that Medicaid will not cover tx for pt with this diagnosis.  Pt and family aware, too.  Home exercises provided.  Revisions to Treatment Plan:  None   Continued Need for Acute Rehabilitation Level of Care: The patient requires daily medical management by a physician with specialized training in physical medicine and rehabilitation for the following conditions: Daily direction of a multidisciplinary physical rehabilitation program to ensure safe treatment while eliciting the highest outcome that is of practical  value to the patient.: Yes Daily medical management of patient stability for increased activity during participation in an intensive rehabilitation regime.: Yes Daily analysis of laboratory values and/or radiology reports with any subsequent need for medication adjustment of medical intervention for : Post surgical problems;Neurological problems;Other  [name] 05/14/2014, 10:01 AM

## 2014-05-13 NOTE — Progress Notes (Signed)
Occupational Therapy Discharge Summary  Patient Details  Name: [name] MRN: [id number] Date of Birth: [birth year]  Today's Date: 05/13/2014  Patient has met 10 of 10 long term goals due to improved activity tolerance and ability to compensate for deficits.  Patient to discharge at overall Supervision level.  Patient's care partner is independent to provide the necessary physical assistance at discharge.  Pt's family has attended multiple family education sessions and have demonstrated ability to assist pt as needed and provide support and encouragement throughout sessions.    Reasons goals not met: N/A  Recommendation:  Patient will benefit from ongoing skilled OT services in home health setting to continue to advance functional skills in the area of BADL and Reduce care partner burden.  Pt is currently w/c level due to continued decreased sensation in BLE and overall weakness in BLE and would benefit from further therapy to return to standing tasks and functional ambulation and build overall increased endurance.  Equipment: drop arm BSC  Reasons for discharge: treatment goals met and discharge from hospital  Patient/family agrees with progress made and goals achieved: Yes  OT Discharge Precautions/Restrictions  Precautions Precautions: Fall Restrictions Weight Bearing Restrictions: No General Amount of Missed OT Time (min): 7 Minutes Missed Time Reason: Pain Vital Signs   Pain Pain Assessment Pain Assessment: 0-10 Pain Score: 7  Pain Type: Acute pain Pain Location: Leg Pain Orientation: Right;Left Pain Descriptors / Indicators: Aching;Burning Pain Frequency: Intermittent Pain Onset: With Activity Patients Stated Pain Goal: 2 Pain Intervention(s): RN made aware;Repositioned ADL  See FIM Vision/Perception  Vision- History Baseline Vision/History: Wears glasses Wears Glasses: At all times Patient Visual Report: No change from baseline Vision- Assessment Vision  Assessment?: No apparent visual deficits  Cognition Overall Cognitive Status: Within Functional Limits for tasks assessed Arousal/Alertness: Lethargic Orientation Level: Oriented X4 Memory: Appears intact Awareness: Appears intact Problem Solving: Appears intact Safety/Judgment: Appears intact Sensation Sensation Light Touch: Impaired Detail Light Touch Impaired Details: Impaired RLE;Impaired LLE Additional Comments: BUEs appear intact Coordination Gross Motor Movements are Fluid and Coordinated: No (intact BUEs) Fine Motor Movements are Fluid and Coordinated: Yes (BUEs) Motor    Mobility  Bed Mobility Bed Mobility: Supine to Sit;Sit to Supine Rolling Right: 6: Modified independent (Device/Increase time) Rolling Left: 6: Modified independent (Device/Increase time) Right Sidelying to Sit: 6: Modified independent (Device/Increase time);With rails Supine to Sit: 6: Modified independent (Device/Increase time);With rails Sit to Supine: 6: Modified independent (Device/Increase time) Scooting to [facility]: 6: Modified independent (Device/Increase time);With rail Transfers Sit to Stand: 4: Min assist Sit to Stand Details (indicate cue type and reason): Posterior lean/LOB with initial attemp  Extremity/Trunk Assessment RUE Assessment RUE Assessment: Within Functional Limits LUE Assessment LUE Assessment: Within Functional Limits  See FIM for current functional status  Caliegh Middlekauff, [facility] 05/13/2014, 10:45 AM

## 2014-05-13 NOTE — Progress Notes (Signed)
Physical Therapy Session Note  Patient Details  Name: [name] MRN: [id number] Date of Birth: 1976-04-17  Today's Date: 05/13/2014 Time: 8718-3672 Time Calculation (min): 31 min  Short Term Goals: Week 1:  PT Short Term Goal 1 (Week 1): Pt to perform sliding board transfers w/ MinA consistently PT Short Term Goal 1 - Progress (Week 1): Met (minA scooting transfer without use of slideboard) PT Short Term Goal 2 (Week 1): Pt to perform sit to stand w/ ModA PT Short Term Goal 2 - Progress (Week 1): Met (with use of // bars to pull up) PT Short Term Goal 3 (Week 1): Pt to tolerate upright standing w/ pain <6/10 PT Short Term Goal 3 - Progress (Week 1): Progressing toward goal (patient reports 6/10 in standing) PT Short Term Goal 4 (Week 1): Pt will manage w/c parts w/ supervision PT Short Term Goal 4 - Progress (Week 1): Met PT Short Term Goal 5 (Week 1): Pt to perform bed mobility w/ MinA PT Short Term Goal 5 - Progress (Week 1): Met Week 2:  PT Short Term Goal 1 (Week 2): STG = LTG   Skilled Therapeutic Interventions/Progress Updates:  Pt. Received semi-reclined in bed with family attending needs. Pt. Is tearful and indicates she is in pain; bilat LEs. Pt. Agrees to donn new orthosis for functional transfers, and ambulation with therapist.  1:1 Treatment focused on orthosis use on RLE with "Gerald Stabs" from [facility]. Pt. Ambulated 6 feet with min A and RW for support and safety. Pt. Performed 6 feet retro-walking back to EOB at min-mod A for safety. Pt. Reported pain due to standing/walking and strongly requested to be supine in bed secondary to Bilateral LE pain. During session, pt. Displayed increased ambulation ability with orthosis having locked  plantarflexion to prevent R Knee hyperextension and open/unlocked dorsiflexion. RLE Brace: Double Action Double Upright AFO (DUAFO)  Pain:see below  Pt. Would continue to benefit from physical therapy with BLE training for balance,  increased mobility, and functional independence; Pt. Will follow up with primary care to facilitate continued care/therapy.  Pt. Condition post therapy session: semi-reclined in bed with all needs within reach. Mother, cousin, and youth present in room.   Therapy Documentation Precautions:  Precautions Precautions: Fall Restrictions Weight Bearing Restrictions: No General: Amount of Missed PT Time (min): 29 Minutes Missed Time Reason: Pain Vital Signs:   Pain: Pain Assessment Pain Assessment: 0-10 Pain Score: 7  Pain Type: Acute pain Pain Location: Leg Pain Orientation: Right;Left Pain Descriptors / Indicators: Aching;Burning Pain Frequency: Intermittent Pain Onset: With Activity Patients Stated Pain Goal: 2 Pain Intervention(s): RN made aware;Repositioned Mobility:   Locomotion : Ambulation Ambulation/Gait Assistance: 4: Min assist  Trunk/Postural Assessment :    Balance:   Exercises:   Other Treatments:    See FIM for current functional status  Therapy/Group: Individual Therapy  Juluis Mire 05/13/2014, 10:13 AM

## 2014-05-13 NOTE — Progress Notes (Signed)
Occupational Therapy Note  Patient Details  Name: [name] MRN: [id number] Date of Birth: 28-Jun-1976 Today's Date: 05/13/2014  Time: 1100-1130 Pt denied pain Individual Therapy  Pt resting in bed upon arrival with family present.  Focus on discharge planning including patient independently demonstrating BUE therex with therex.  Pt/family educated on importance of daily activity/exercise and instructed on additional therex at home.  Pt/family pleased with progress and looking forward to going home.   Le[facility]is [facility] 05/13/2014, 3:14 PM

## 2014-05-13 NOTE — Progress Notes (Signed)
[place] PHYSICAL MEDICINE & REHABILITATION     PROGRESS NOTE    Subjective/Complaints: Had a "seizure" yesterday----headache followed by tonic clonic jerks in all 4 limbs.  A  review of systems has been performed and if not noted above is otherwise negative.   Objective: Vital Signs: Blood pressure 93/66, pulse 101, temperature 98.9 F (37.2 C), temperature source Oral, resp. rate 18, weight 113.127 kg (249 lb 6.4 oz), SpO2 99.00%. No results found. No results found for this basename: WBC, HGB, HCT, PLT,  in the last 72 hours No results found for this basename: NA, K, CL, CO, GLUCOSE, BUN, CREATININE, CALCIUM,  in the last 72 hours CBG (last 3)   Recent Labs  05/12/14 1700 05/12/14 2133 05/13/14 0726  GLUCAP 192* 188* 115*    Wt Readings from Last 3 Encounters:  05/13/14 113.127 kg (249 lb 6.4 oz)  04/28/14 114.579 kg (252 lb 9.6 oz)    Physical Exam:  Constitutional: She is oriented to person, place, and time. She appears well-developed and well-nourished.    HENT:  Head: Normocephalic and atraumatic.  Eyes: Conjunctivae are normal. Pupils are equal, round, and reactive to light.  Neck: Normal range of motion. Neck supple.  Cardiovascular: Normal rate and regular rhythm.  No murmur heard.  Respiratory: Effort normal and breath sounds normal. No respiratory distress. She has no wheezes.  GI: Soft. Bowel sounds are normal. Non-distended. Has a lot of bruising from lovenox injections. Less tender Musculoskeletal:  Denies any pain with attempts at ROM bilateral hips, knees and feet. 1 to 2+ pitting edema BLE from hips to feet-- proximal > distal.   RLE Vesicular rash better Neurological: She is alert and oriented to person, place, and time.  Cognition at baseline Follows commands without difficulty.  decreased sensation below right knee bilaterally --tr/2 RLE and 1/2 LLE,  2- HF, RKE trace, LKE 2/5, 2/5 L ankle PF, 2/5 L ankle DF;  2/5 R PF, 1 R DF.  DTR's trace to  absent in LE's  Psych: pt pleasant and generally cooperative. flat   Assessment/Plan: 1. 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 Physiatrist is providing close team supervision and 24 hour management of active medical problems listed below. Physiatrist and rehab team continue to assess barriers to discharge/monitor patient progress toward functional and medical goals.  FIM: FIM - Bathing Bathing Steps Patient Completed: Chest;Right upper leg;Right Arm;Left upper leg;Left Arm;Right lower leg (including foot);Abdomen;Left lower leg (including foot);Front perineal area;Buttocks Bathing: 5: Supervision: Safety issues/verbal cues  FIM - Upper Body Dressing/Undressing Upper body dressing/undressing steps patient completed: Thread/unthread left sleeve of pullover shirt/dress;Thread/unthread right sleeve of pullover shirt/dresss;Put head through opening of pull over shirt/dress;Pull shirt over trunk Upper body dressing/undressing: 7: Complete Independence: No helper FIM - Lower Body Dressing/Undressing Lower body dressing/undressing steps patient completed: Thread/unthread right pants leg;Thread/unthread left pants leg;Don/Doff right sock;Don/Doff left sock Lower body dressing/undressing: 4: Min-Patient completed 75 plus % of tasks  FIM - Toileting Toileting steps completed by patient: Adjust clothing prior to toileting;Adjust clothing after toileting;Performs perineal hygiene Toileting Assistive Devices: Grab bar or rail for support Toileting: 5: Supervision: Safety issues/verbal cues  FIM - Radio producer Devices: Recruitment consultant Transfers: 0-Activity did not occur  FIM - Control and instrumentation engineer Devices: Arm rests Bed/Chair Transfer: 0: Activity did not occur  FIM - Locomotion:  Wheelchair Distance: 350 Locomotion: Wheelchair:  6: Travels 150 ft or more, turns around, maneuvers to table, bed or toilet, negotiates 3% grade: maneuvers on rugs and over door sills independently FIM - Locomotion: Ambulation Locomotion: Ambulation Assistive Devices: Administrator Ambulation/Gait Assistance: 3: Mod assist Locomotion: Ambulation: 0: Activity did not occur  Comprehension Comprehension Mode: Auditory Comprehension: 6-Follows complex conversation/direction: With extra time/assistive device  Expression Expression Mode: Verbal Expression: 6-Expresses complex ideas: With extra time/assistive device  Social Interaction Social Interaction: 6-Interacts appropriately with others with medication or extra time (anti-anxiety, antidepressant).  Problem Solving Problem Solving: 6-Solves complex problems: With extra time  Memory Memory: 6-More than reasonable amt of time  Medical Problem List and Plan:  1. 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 DVT Prophylaxis/Anticoagulation: Pharmaceutical: Lovenox resumed  -dopplers negative 3. Pain Management:   Continue neurontin 300 mg tid as well as elavil 50 mg at bedtime.    -hydrocodone prn  -added LOW dose ms contin---start weaning 4. Mood: No signs of depression or anxiety noted. LCSW to follow for evaluation and support. Medications compared with care everywhere notes ( tegretol 500 mg tid, Trazodone 100 Mg/HS prn, cogentin 1 mg tid and haldol 5mg  tid) and patient's input conflicts with records.   Continue current klonopin, Seroquel and haldol as we are doing.   -outpt psych follow up  -mood appropriate at present 5. Neuropsych: This patient is capable of making decisions on her own behalf.  6. DM type 2--poorly controlled: Hgb A1c-10.4. She reports that she used novolog tid ac and levemir 60 units at home.   -adjust levemir to 24 units bid given  hypoglycemia/ adjust further as indicated.   -sugars better ---needs to eat consistently 7. Urinary retention: improved   -decreased elavil to 50mg   -out of bed to void, double voids  -moving bowels   8. Folliculitis:  keflex/abx completed 9. Renal cysts: Will need follow up with PMD on outpatient basis for workup.  10. Peripheral edema:  Encourage elevation. TEDs.   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 outpt follow up  LOS (Days) 15 A FACE TO FACE EVALUATION WAS PERFORMED  SWARTZ,ZACHARY T 05/13/2014 8:06 AM

## 2014-05-13 NOTE — Progress Notes (Signed)
Physical Therapy Discharge Summary  Patient Details  Name: [name] MRN: [id number] Date of Birth: August 12, 1976  Today's Date: 05/13/2014 Time: 1300-1350 Time Calculation (min): 50 min  Patient has met 8 of 10 long term goals due to improved activity tolerance, improved balance, improved postural control, increased strength, decreased pain, ability to compensate for deficits and improved coordination.  Patient to discharge at a wheelchair level Modified Independent.   Patient's care partner is independent to provide the necessary physical assistance at discharge.  Reasons goals not met: Pt was unable to consistently improve ambulation distance to 10 feet. Pt has been unable to do stairs, even for LE strengthening only.   Recommendation:  PT instructed family members in how to progress exercises to maximize LE strengthening and progress function (Practice repeated sit to stands with w/c behind pt, CG holding onto belt for safety, holding onto kitchen counter for support.)  Equipment: 22x18 manual w/c; 19" floor to seat height; [employer] cushion, swing away legrests (pt refuses elevating); desk length armrests. Standard RW.   Reasons for discharge: treatment goals met  Patient/family agrees with progress made and goals achieved: Yes  Treatment Interventions W/C Management: Pt demonstrates ability to self propel manual w/c x 300' with B UEs mod I. PT instructs pt in ascending/descending ramp in w/c req SBA with verbal cues for technique. PT instructs pt in legrest management req SBA.   Therapeutic Activity: Pt demonstrates mod I bed mobility with L bedrail: roll L and L side lie to sit; Mod I scoot transfer bed to w/c to L/R with armrest; mod I scoot transfer bed to/from bedside commode; Sit to stand with wall rail and L AFO req CGA. PT instructs pt in car transfer with verbal cues for hand placement req SBA. Family members and pt verbalize understanding in w/c placement for safe set up  of transfer.   Therapeutic Exercise: PT instructs pt in sit to stand with wall rail req CGA x 10 reps. PT instructs 2 family members (female) in how to guard pt with a belt and each family member demonstrates understanding.   Pt has progressed to grossly a mod I w/c level for functional mobility. Family is very concerned about how pt will ambulate again without follow up therapy. PT instructs family and pt in HEP of repeated sit to stands at kitchen counter with w/c behind pt and having pt wear pants with a belt, so that family can hold onto the belt for safety. PT instructs family that once pt can perform 53 sit to stands without legs buckling in a row, then she can begin to practice marching in place. Once pt is able to complete 38 marches in place without legs buckling, then it would be safe for her to attempt ambulation. Family asks if pt can ambulate a few steps into the bathroom with a RW and PT instructs family to do scoot transfers with a BSC and they verbalize understanding.    PT Discharge Precautions/Restrictions Precautions Precautions: Fall Required Braces or Orthoses:  (L AFO) Restrictions Weight Bearing Restrictions: No Pain Pain Assessment Pain Assessment: 0-10 Pain Score: 6  Pain Type: Neuropathic pain Pain Location: Leg Pain Orientation: Right;Left Pain Descriptors / Indicators: Tingling Pain Onset: On-going Pain Intervention(s): Medication (See eMAR) Multiple Pain Sites: No     Cognition Overall Cognitive Status: Within Functional Limits for tasks assessed Arousal/Alertness: Lethargic Orientation Level: Oriented X4 Attention: Focused;Sustained Focused Attention: Appears intact Sustained Attention: Appears intact Memory: Appears intact Awareness: Appears intact  Problem Solving: Appears intact Safety/Judgment: Appears intact Sensation Sensation Light Touch: Impaired Detail Light Touch Impaired Details: Impaired RLE;Impaired LLE Stereognosis: Not  tested Hot/Cold: Not tested Additional Comments: B UEs intact Coordination Gross Motor Movements are Fluid and Coordinated: No Fine Motor Movements are Fluid and Coordinated: Not tested Motor  Motor Motor: Within Functional Limits Motor - Skilled Clinical Observations: Weak/painful w/ all LE movements  Mobility Bed Mobility Bed Mobility: Rolling Left;Left Sidelying to Sit Rolling Right: 6: Modified independent (Device/Increase time) Rolling Left: 6: Modified independent (Device/Increase time) Right Sidelying to Sit: 6: Modified independent (Device/Increase time);With rails Left Sidelying to Sit: 6: Modified independent (Device/Increase time) Supine to Sit: 6: Modified independent (Device/Increase time);With rails Sit to Supine: 6: Modified independent (Device/Increase time) Scooting to [facility]: 6: Modified independent (Device/Increase time);With rail Transfers Transfers: Yes Sit to Stand: 4: Min guard Sit to Stand Details (indicate cue type and reason): Posterior lean/LOB with initial attemp Squat Pivot Transfers: 6: Modified independent (Device/Increase time) Squat Pivot Transfer Details (indicate cue type and reason): no AD Lateral/Scoot Transfers: 6: Modified independent (Device/Increase time) Lateral/Scoot Transfer Details (indicate cue type and reason): no AD Locomotion  Ambulation Ambulation: Yes Ambulation/Gait Assistance: 4: Min assist Ambulation Distance (Feet): 6 Feet Assistive device: Rolling walker Ambulation/Gait Assistance Details: AFO for foot drop Gait Gait: Yes Gait Pattern: Impaired Gait velocity: decreased Stairs / Additional Locomotion Stairs: No Ramp: 5: Supervision (in w/c) Curb: Not tested (comment) Product manager Mobility: Yes Wheelchair Assistance: 6: Modified independent (Device/Increase time) Environmental health practitioner: Both upper extremities Wheelchair Parts Management: Supervision/cueing Distance: 300  Trunk/Postural Assessment   Cervical Assessment Cervical Assessment: Within Functional Limits Thoracic Assessment Thoracic Assessment: Within Functional Limits Lumbar Assessment Lumbar Assessment: Within Functional Limits Postural Control Postural Control: Deficits on evaluation Postural Limitations: slouched positioning  Balance Balance Balance Assessed: Yes Static Sitting Balance Static Sitting - Balance Support: Bilateral upper extremity supported Static Sitting - Level of Assistance: 6: Modified independent (Device/Increase time) Dynamic Sitting Balance Dynamic Sitting - Balance Support: Bilateral upper extremity supported Dynamic Sitting - Level of Assistance: 6: Modified independent (Device/Increase time) Static Standing Balance Static Standing - Balance Support: Bilateral upper extremity supported;During functional activity Static Standing - Level of Assistance: 4: Min assist Dynamic Standing Balance Dynamic Standing - Balance Support: Bilateral upper extremity supported;During functional activity Dynamic Standing - Level of Assistance: 4: Min assist Extremity Assessment  RUE Assessment RUE Assessment: Within Functional Limits LUE Assessment LUE Assessment: Within Functional Limits RLE Assessment RLE Assessment: Exceptions to [facility] RLE Strength Right Hip Flexion: 3-/5 Right Knee Flexion: 3-/5 Right Knee Extension: 4/5 Right Ankle Dorsiflexion: 2/5 Right Ankle Plantar Flexion: 3+/5 LLE Assessment LLE Assessment: Exceptions to [facility] LLE Strength Left Hip Flexion: 3+/5 Left Knee Flexion: 3+/5 Left Knee Extension: 4/5 Left Ankle Dorsiflexion: 2-/5 Left Ankle Plantar Flexion: 3+/5  See FIM for current functional status  Ermalinda Joubert M 05/13/2014, 2:22 PM

## 2014-05-13 NOTE — Discharge Summary (Signed)
Physician Discharge Summary  Patient ID: [name] MRN: [id number] DOB/AGE: 1976/09/18 38 y.o.  Admit date: 04/28/2014 Discharge date: 05/14/2014  Discharge Diagnoses:  Principal Problem:   Myopathy Active Problems:   Rhabdomyolysis   Paraparesis of both lower limbs   Cocaine abuse   Bipolar disorder, unspecified   DM type 2 (diabetes mellitus, type 2)- poorly controlled   Urinary retention with incomplete bladder emptying   Renal mass of unknown nature   Discharged Condition: Stable   Significant Diagnostic Studies:   Ct Abdomen Pelvis W Contrast  04/20/2014   CLINICAL DATA:  Possible trauma.  Altered mental status.  EXAM: CT CHEST, ABDOMEN, AND PELVIS WITH CONTRAST  TECHNIQUE: Multidetector CT imaging of the chest, abdomen and pelvis was performed following the standard protocol during bolus administration of intravenous contrast.  CONTRAST:  152mL OMNIPAQUE IOHEXOL 300 MG/ML  SOLN  COMPARISON:  Chest radiograph earlier today  FINDINGS: CT CHEST FINDINGS  There is no evidence of great vessel injury. 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 The heart is normal in size. No enlarged axillary, mediastinal, or hilar lymph nodes are seen. There is no pleural or pericardial effusion. There is no pneumothorax. Ground-glass opacities are present in both upper lobes. Minimal subpleural opacity dependently in the lower lobes likely represents atelectasis. No acute osseous abnormality is identified in the chest.  CT ABDOMEN AND PELVIS FINDINGS  The gallbladder is surgically absent. The liver, spleen, adrenal glands, and pancreas have an unremarkable enhanced appearance. Hypodense renal lesions measure 1.2 cm in the right upper pole and 8 mm in the interpolar region on the left.  A Foley catheter is present in the bladder, which is decompressed. Uterus and ovaries are grossly unremarkable. The bowel is grossly unremarkable. No free fluid or enlarged lymph nodes are  identified. Abdominal aorta and its major branch vessels are patent. No acute osseous abnormality is identified. Mild facet arthrosis is noted in the lower lumbar spine.  IMPRESSION: 1. Bilateral upper lobe ground-glass opacities, concerning for multifocal infection or possibly contusion given the possibility of trauma. 2. No acute abnormality identified in the abdomen or pelvis. 3. Small bilateral hypodense renal lesions, possibly cysts but indeterminate on this study. Consider further evaluation with nonemergent abdominal MRI after the patient's acute illness has resolved.   Electronically Signed   By: Logan Bores   On: 04/20/2014 13:58     Labs:  Basic Metabolic Panel:    Component Value Date/Time   NA 133* 04/29/2014 0830   K 4.8 04/29/2014 0830   CL 98 04/29/2014 0830   CO2 24 04/29/2014 0830   GLUCOSE 246* 04/29/2014 0830   BUN 14 04/29/2014 0830   CREATININE 0.63 04/29/2014 0830   CALCIUM 8.5 04/29/2014 0830   GFRNONAA >90 04/29/2014 0830   GFRAA >90 04/29/2014 0830     CBC: CBC Latest Ref Rng 04/29/2014 04/26/2014 04/22/2014  WBC 4.0 - 10.5 K/uL 8.4 5.1 9.4  Hemoglobin 12.0 - 15.0 g/dL 13.6 11.9(L) 13.6  Hematocrit 36.0 - 46.0 % 39.9 35.3(L) 40.9  Platelets 150 - 400 K/uL 384 311 228     CBG:  Recent Labs Lab 05/13/14 0726 05/13/14 1145 05/13/14 1656 05/13/14 2043 05/14/14 0730  GLUCAP 115* 169* 103* 160* 125*    Brief HPI:   [name] is a 38 y.o. female smoker, with h/o DM with peripheral neuropathy, HTN, seizures, suicidal ideation--most recent 03/16/14, polysubstance abuse; who was admitted on 04/20/14 after being found down in  yard with decreased MS and inability to move legs. She was found to have elevated CK, elevated glucose, lactic acid, severely distended bladder as well as CXR concerning for aspiration. UDS positive for cocaine and patient without memory of events. Foley placed with 2.5 L from bladder and she was treated for severe rhabdomyolysis as well as probable sepsis  from aspiration PNA. CT Head/Cervical spine/and abdomen without acute abnormality. MRI toracic spine negative and lumbar spine films revealed confluent severe muscle edema in lower pelvis affecting gluteal muscle, adductors and lower lumbar erector spinae muscles--likely due to severe myositis in setting of Rhabdo.  Neurology consulted for input and felt that patient's symptoms likely due to an axonal sensory-motor neuropathy as well as a lumbosacral plexopathy due to severe compressive myositis in the setting of generalized rhabdomyolysis. NCS/EMG recommended for follow up. Dr. Cathie Olden consulted for input on global hypokinesis and felt that EF c/w cocaine use and recommended follow up on outpatient basis as well as stopping use of cocaine, ETOH and tobacco.  She has had improvement in motor strength as well as activity level. CIR was recommended by rehab team and patient admitted today.    Hospital Course: [name] was admitted to rehab 04/28/2014 for inpatient therapies to consist of PT, ST and OT at least three hours five days a week. Past admission physiatrist, therapy team and rehab RN have worked together to provide customized collaborative inpatient rehab. She has had problems with neuropathy and was started on Neurontin as well as tramadol for pain management. MS contin was added to help with tolerance of therapies and was weaned off by discharge.  Diabetes has been monitored on ac/hs basis and lantus was titrated for better control but not tight control due to hypoglycemic episodes. BLE dopplers were ordered due to 2+ edema likely due to compression as well as paraparesis. This was negative for DVT and this was treated with TEDs as well as elevation.  She was treated with once week course antibiotic for folliculitis and diflucan was added for candida in urine. She was started on bowel program for neurogenic bowel but was not compliant with this. Mother was instructed on continuing suppository every  couple of days to help with evacuation. Foley was discontinued and bladder program attempted but patient refused PVR checks or in and out caths for high volumes despite education regarding detrimental effects of urinary retention especially in setting of DM.  She has been voiding adequate amounts without reports of incontinence.   Psychiatry was consulted for input on medications as her mother was unable to find any medications at home and patient with conflicting information regarding current psych medications as this did not correspond to d/c medications from [facility]. Patient has not been seen at [facility] for years.  Dr. Adele Schilder with psychiatry recommended continuing current medications as patient stable and to follow up with outpatient psychiatry. She has had recurrent pseudoseizures therefore depakote was resumed and tramadol discontinued. Dr. Marlane Hatcher with psychology as attempted to provide support as patient seemed to be experiencing significant depressed mood at a level that requires treatment, though she was not willing to engage in meaningful therapeutic discussion surrounding her depression. She will benefit from psychotherapy and is to follow up with [facility] or discuss alternative with her PCP.  Mood has been appropriate and she has shown good participation in therapy. She is currently modified independent at wheelchair level and requires min assist with ambulation. She was fitted with double upright AFO to help  prevent right knee extension. Mother is to provide 24 hours supervision as well as assume responsibility of administering medications as prescribed. She has been set to follow up PCP this week for post hospital evaluation.    Rehab course: During patient's stay in rehab weekly team conferences were held to monitor patient's progress, set goals and discuss barriers to discharge. She requires min to mod assist with all mobility as well as min to total assist with ADL tasks.   Patient  has had improvement in activity tolerance, balance, postural control, as well as ability to compensate for deficits.  She is able to complete bathing and dressing with set up assist and supervision. She is modified independent with transfers and is able to ambulate 6-10 feet with min assist.  Patient and mother were educated on HEP as follow up therapies weere not covered under current diagnosis.   Disposition:  Home  Diet: Diabetic.   Special Instructions: 1. Family accepts responsibility to keep medications locked in a safe place and administer as directed to avoid overdose and/or suicidal  attempt.  2. You need to keep follow up appointment with Ms. Wells to decide if [facility] or other pathway indicated for psychiatry follow up    Medication List    STOP taking these medications       amoxicillin-clavulanate 875-125 MG per tablet  Commonly known as:  AUGMENTIN     naproxen 375 MG tablet  Commonly known as:  NAPROSYN     traMADol 50 MG tablet  Commonly known as:  ULTRAM      TAKE these medications       amitriptyline 50 MG tablet  Commonly known as:  ELAVIL  Take 1 tablet (50 mg total) by mouth at bedtime.     bisacodyl 10 MG suppository  Commonly known as:  DULCOLAX  Place 1 suppository (10 mg total) rectally daily after supper.     clonazePAM 0.5 MG tablet  Commonly known as:  KLONOPIN  Take 1 tablet (0.5 mg total) by mouth 3 (three) times daily before meals.     divalproex 500 MG DR tablet  Commonly known as:  DEPAKOTE  Take 1 tablet (500 mg total) by mouth every 8 (eight) hours.     famotidine 20 MG tablet  Commonly known as:  PEPCID  Take 1 tablet (20 mg total) by mouth 2 (two) times daily.     gabapentin 300 MG capsule  Commonly known as:  NEURONTIN  Take 1 capsule (300 mg total) by mouth 3 (three) times daily.     haloperidol 5 MG tablet  Commonly known as:  HALDOL  Take 1 tablet (5 mg total) by mouth 3 (three) times daily.     HYDROcodone-acetaminophen  5-325 MG per tablet---Rx #60 pills   Commonly known as:  NORCO/VICODIN  Take 1 tablet by mouth every 8 (eight) hours as needed for severe pain.     insulin detemir 100 UNIT/ML injection  Commonly known as:  LEVEMIR  Inject 0.24 mLs (24 Units total) into the skin at bedtime.     insulin lispro 100 UNIT/ML injection  Commonly known as:  HUMALOG  Inject 2-12 Units into the skin 3 (three) times daily with meals.     Insulin Syringes (Disposable) U-100 0.3 ML Misc  Inject 24 Units into the skin at bedtime.     QUEtiapine 100 MG tablet  Commonly known as:  SEROQUEL  Take 1 tablet (100 mg total) by mouth at bedtime.  senna-docusate 8.6-50 MG per tablet  Commonly known as:  Senokot-S  Take 2 tablets by mouth 2 (two) times daily.       Follow-up Information   Follow up with Meredith Staggers, MD On 07/04/2014. (Be there at 10;30 am for 11 am)    Specialty:  Physical Medicine and Rehabilitation   Contact information:   [address], [address] [place] [address] [phone]       Schedule an appointment as soon as possible for a visit with [facility]. (Walk-in appointments M-F 8a - 5p to re-establish care)    Contact information:   [address]  [place], [place]  [address] [phone]      Follow up with Suzan Garibaldi, FNP On 05/16/2014. (Be there 9:30 am for post hospital follow up. Needs further evaluation of small renal lesions. )    Specialty:  Nurse Practitioner   Contact information:   [place] [address] [phone]       Call [name] Amber., MD. (for follow up on cardiomyopathy. )    Specialty:  Cardiology   Contact information:   [place] [address] [address] [phone]       Call [place]. (for follow up studies on LE weakness.)    Contact information:   [address] [place] [address] [phone]      Signed: Bary Leriche 05/14/2014, 4:32  PM

## 2014-05-13 NOTE — Plan of Care (Signed)
Problem: RH Ambulation Goal: LTG Patient will ambulate in controlled environment (PT) LTG: Patient will ambulate in a controlled environment, # of feet with assistance (PT).  Outcome: Not Met (add Reason) Pt is unable to consistently ambulate a distance of 10 feet.

## 2014-05-13 NOTE — Progress Notes (Signed)
Occupational Therapy Session Note  Patient Details  Name: [name] MRN: [id number] Date of Birth: 01/10/1976  Today's Date: 05/13/2014 Time: [id number] Time Calculation (min): 53 min  Short Term Goals: Week 2:  OT Short Term Goal 1 (Week 2): Short Term Goals = Long Term Goals  Skilled Therapeutic Interventions/Progress Updates:    Completed ADL retraining at overall supervision level.  Pt able to complete squat/scoot pivot transfers bed <> w/c with setup assist only.  Pt tearful this session due to increase in pain, RN notified but unable to receive additional meds at this time.  Pt's family present and completed family education by providing necessary encouragement and setup assist throughout self-care tasks.  Educated pt and family members on alternatives to ensure thorough hygiene with washing buttocks and completing LB dressing, strongly encouraging lateral leans and utilizing BSC as wider to allow clearance of buttocks for hygiene and pulling up pants.  Pt requested to return to bed at end of session due to pain.  Discussed d/c with pt and family, who report ready and no further questions for this therapist.  Therapy Documentation Precautions:  Precautions Precautions: Fall Restrictions Weight Bearing Restrictions: No General: General Amount of Missed OT Time (min): 7 Minutes Missed Time Reason: Pain Vital Signs:   Pain: Pain Assessment Pain Assessment: 0-10 Pain Score: 7  Pain Type: Acute pain Pain Location: Leg Pain Orientation: Right;Left Pain Descriptors / Indicators: Aching;Burning Pain Frequency: Intermittent Pain Onset: With Activity Patients Stated Pain Goal: 2 Pain Intervention(s): RN made aware;Repositioned  See FIM for current functional status  Therapy/Group: Individual Therapy  Simonne Come 05/13/2014, 10:38 AM

## 2014-05-14 DIAGNOSIS — G729 Myopathy, unspecified: Secondary | ICD-10-CM

## 2014-05-14 DIAGNOSIS — G8220 Paraplegia, unspecified: Secondary | ICD-10-CM

## 2014-05-14 DIAGNOSIS — F319 Bipolar disorder, unspecified: Secondary | ICD-10-CM

## 2014-05-14 DIAGNOSIS — G929 Unspecified toxic encephalopathy: Secondary | ICD-10-CM

## 2014-05-14 DIAGNOSIS — G92 Toxic encephalopathy: Secondary | ICD-10-CM

## 2014-05-14 DIAGNOSIS — E119 Type 2 diabetes mellitus without complications: Secondary | ICD-10-CM

## 2014-05-14 LAB — GLUCOSE, CAPILLARY: Glucose-Capillary: 125 mg/dL — ABNORMAL HIGH (ref 70–99)

## 2014-05-14 MED ORDER — AMITRIPTYLINE HCL 50 MG PO TABS: 50.0000 mg | ORAL_TABLET | Freq: Every day | ORAL | Status: DC

## 2014-05-14 MED ORDER — SENNOSIDES-DOCUSATE SODIUM 8.6-50 MG PO TABS: 2.0000 | ORAL_TABLET | Freq: Two times a day (BID) | ORAL | Status: DC

## 2014-05-14 MED ORDER — GABAPENTIN 300 MG PO CAPS: 300.0000 mg | ORAL_CAPSULE | Freq: Three times a day (TID) | ORAL | Status: DC

## 2014-05-14 MED ORDER — QUETIAPINE FUMARATE 100 MG PO TABS: 100.0000 mg | ORAL_TABLET | Freq: Every day | ORAL | Status: DC

## 2014-05-14 MED ORDER — INSULIN DETEMIR 100 UNIT/ML ~~LOC~~ SOLN
24.0000 [IU] | Freq: Every day | SUBCUTANEOUS | Status: DC
Start: 2014-05-14 — End: 2019-03-07

## 2014-05-14 MED ORDER — HYDROCODONE-ACETAMINOPHEN 5-325 MG PO TABS: 1.0000 | ORAL_TABLET | Freq: Three times a day (TID) | ORAL | Status: DC | PRN

## 2014-05-14 MED ORDER — CLONAZEPAM 0.5 MG PO TABS: 0.5000 mg | ORAL_TABLET | Freq: Three times a day (TID) | ORAL | Status: DC

## 2014-05-14 MED ORDER — INSULIN SYRINGES (DISPOSABLE) U-100 0.3 ML MISC: 24.0000 [IU] | Freq: Every day | Status: DC

## 2014-05-14 MED ORDER — DIVALPROEX SODIUM 500 MG PO DR TAB: 500.0000 mg | DELAYED_RELEASE_TABLET | Freq: Three times a day (TID) | ORAL | Status: DC

## 2014-05-14 MED ORDER — CLONAZEPAM 1 MG PO TABS: 0.5000 mg | ORAL_TABLET | Freq: Every day | ORAL | Status: DC

## 2014-05-14 MED ORDER — BISACODYL 10 MG RE SUPP: 10.0000 mg | Freq: Every day | RECTAL | Status: DC

## 2014-05-14 MED ORDER — HALOPERIDOL 5 MG PO TABS: 5.0000 mg | ORAL_TABLET | Freq: Three times a day (TID) | ORAL | Status: DC

## 2014-05-14 NOTE — Discharge Instructions (Signed)
Inpatient Rehab Discharge Instructions  [name] Discharge date and time: 05/14/14   Activities/Precautions/ Functional Status: Activity: activity as tolerated with 24 hours supervision. No driving Diet: diabetic diet Wound Care: none needed  Functional status:  ___ No restrictions     ___ Walk up steps independently _X__ 24/7 supervision/assistance   ___ Walk up steps with assistance ___ Intermittent supervision/assistance  ___ Bathe/dress independently ___ Walk with walker     _X__ Bathe/dress with assistance ___ Walk Independently    ___ Shower independently _X__ Walk with assistance    ___ Shower with assistance _X__ No alcohol/Cocaine/[place]ana.   ___ Return to work/school ________   COMMUNITY REFERRALS UPON DISCHARGE:      HHRN via [place]    Due to non-coverage by Medicaid of [place] therapies, please follow home exercise program provided by team on Rehab  Medical Equipment/Items Ordered: hospital bed, wheelchair, walker and drop arm commode                                                     Agency/Supplier: [employer] @ [phone]   GENERAL COMMUNITY RESOURCES FOR PATIENT/FAMILY:  Mental Health:  Re-establish care at [facility] [place]      Special Instructions: 1. Family accepts responsibility to keep medications locked in a safe place and administer as directed to avoid overdose and/or suicidal  attempt.  2. You need to keep follow up appointment with Ms. Wells to decide if [facility] or other pathway indicated for psychiatry follow up.   My questions have been answered and I understand these instructions. I will adhere to these goals and the provided educational materials after my discharge from the hospital.  Patient/Caregiver Signature _______________________________ Date __________  Clinician Signature _______________________________________ Date __________  Please bring this form and your medication list with you to all your follow-up  doctor's appointments.

## 2014-05-14 NOTE — Progress Notes (Addendum)
Social Work  Discharge Note  The overall goal for the admission was met for:   Discharge location: Yes - home with mother and other family members providing any assistance needed.  Length of Stay: Yes - 16 days  Discharge activity level: Yes - mod i wheelchair/ short distance amb  Home/community participation: Yes  Services provided included: MD, RD, PT, OT, RN, TR, Pharmacy, Neuropsych and SW  Financial Services: Other: NONE  - Mediciad and SSD applications pending  Follow-up services arranged:  Home Health: [facility] arranged via [place] Please Note:  TEAM RECOMMENDS HHPT AND OT FOLLOW-UP, HOWEVER, MEDICAID DOES NOT [place] DIAGNOSIS, DME: 20X18 lightweight w/c, cushion, rolling walker, hospital bed, drop arm commode all via [place] and Patient/Family has no preference for HH/DME agencies  Comments (or additional information):  Connected pt with [employer] med assistance program.  Also provided information on [facility] for follow up mental health management  Patient/Family verbalized understanding of follow-up arrangements: Yes  Individual responsible for coordination of the follow-up plan: patient  Confirmed correct DME delivered: [name] 05/14/2014    Hallie Ertl

## 2014-05-14 NOTE — Progress Notes (Signed)
Discharge to home accompanied by mother. Discharge instructions given to patient and mother, no questions noted. Equipment delivered to room. No other issues noted. Margarito Liner

## 2014-05-14 NOTE — Progress Notes (Signed)
[facility] PHYSICAL MEDICINE & REHABILITATION     PROGRESS NOTE    Subjective/Complaints: "im hurting" ---Mod I in room.  A  review of systems has been performed and if not noted above is otherwise negative.   Objective: Vital Signs: Blood pressure 103/72, pulse 129, temperature 98.3 F (36.8 C), temperature source Oral, resp. rate 19, weight 113.1 kg (249 lb 5.4 oz), SpO2 97.00%. No results found. No results found for this basename: WBC, HGB, HCT, PLT,  in the last 72 hours No results found for this basename: NA, K, CL, CO, GLUCOSE, BUN, CREATININE, CALCIUM,  in the last 72 hours CBG (last 3)   Recent Labs  05/13/14 1656 05/13/14 2043 05/14/14 0730  GLUCAP 103* 160* 125*    Wt Readings from Last 3 Encounters:  05/14/14 113.1 kg (249 lb 5.4 oz)  04/28/14 114.579 kg (252 lb 9.6 oz)    Physical Exam:  Constitutional: She is oriented to person, place, and time. She appears well-developed and well-nourished.    HENT:  Head: Normocephalic and atraumatic.  Eyes: Conjunctivae are normal. Pupils are equal, round, and reactive to light.  Neck: Normal range of motion. Neck supple.  Cardiovascular: Normal rate and regular rhythm.  No murmur heard.  Respiratory: Effort normal and breath sounds normal. No respiratory distress. She has no wheezes.  GI: Soft. Bowel sounds are normal. Non-distended. Has a lot of bruising from lovenox injections. Less tender Musculoskeletal:  Denies any pain with attempts at ROM bilateral hips, knees and feet. 1 to 2+ pitting edema BLE from hips to feet-- proximal > distal.   RLE Vesicular rash better Neurological: She is alert and oriented to person, place, and time.  Cognition at baseline Follows commands without difficulty.  decreased sensation below right knee bilaterally --tr/2 RLE and 1/2 LLE,  2- HF, RKE trace, LKE 2/5, 2/5 L ankle PF, 2/5 L ankle DF;  2/5 R PF, 1 R DF.  DTR's trace to absent in LE's  Psych: pt pleasant and generally  cooperative. flat   Assessment/Plan: 1. 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 Physiatrist is providing close team supervision and 24 hour management of active medical problems listed below. Physiatrist and rehab team continue to assess barriers to discharge/monitor patient progress toward functional and medical goals.  Home today. Limited follow up due financial considerations  FIM: FIM - Bathing Bathing Steps Patient Completed: Chest;Right upper leg;Right Arm;Left upper leg;Left Arm;Right lower leg (including foot);Abdomen;Left lower leg (including foot);Front perineal area;Buttocks Bathing: 5: Supervision: Safety issues/verbal cues  FIM - Upper Body Dressing/Undressing Upper body dressing/undressing steps patient completed: Thread/unthread left sleeve of pullover shirt/dress;Thread/unthread right sleeve of pullover shirt/dresss;Put head through opening of pull over shirt/dress;Pull shirt over trunk Upper body dressing/undressing: 7: Complete Independence: No helper FIM - Lower Body Dressing/Undressing Lower body dressing/undressing steps patient completed: Thread/unthread right pants leg;Thread/unthread left pants leg;Don/Doff right sock;Don/Doff left sock Lower body dressing/undressing: 4: Min-Patient completed 75 plus % of tasks  FIM - Toileting Toileting steps completed by patient: Adjust clothing prior to toileting;Performs perineal hygiene;Adjust clothing after toileting Toileting Assistive Devices: Grab bar or rail for support Toileting: 5: Set-up assist to: Obtain supplies  FIM - Radio producer Devices: Bedside commode Toilet Transfers: 6-Assistive device: No helper  FIM - Control and instrumentation engineer Devices: Arm rests Bed/Chair Transfer: 6: Assistive device: no helper;6: Supine > Sit:  No assist;6: Bed >  Chair or W/C: No assist;6: Sit > Supine: No assist  FIM - Locomotion: Wheelchair Distance: 300 Locomotion: Wheelchair: 6: Travels 150 ft or more, turns around, maneuvers to table, bed or toilet, negotiates 3% grade: maneuvers on rugs and over door sills independently FIM - Locomotion: Ambulation Locomotion: Ambulation Assistive Devices: Administrator Ambulation/Gait Assistance: 4: Min assist Locomotion: Ambulation: 1: Travels less than 50 ft with minimal assistance (Pt.>75%)  Comprehension Comprehension Mode: Auditory Comprehension: 6-Follows complex conversation/direction: With extra time/assistive device  Expression Expression Mode: Verbal Expression: 6-Expresses complex ideas: With extra time/assistive device  Social Interaction Social Interaction: 6-Interacts appropriately with others with medication or extra time (anti-anxiety, antidepressant).  Problem Solving Problem Solving: 6-Solves complex problems: With extra time  Memory Memory: 6-More than reasonable amt of time  Medical Problem List and Plan:  1. 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 DVT Prophylaxis/Anticoagulation: Pharmaceutical: Lovenox resumed  -dopplers negative 3. Pain Management:   Continue neurontin 300 mg tid as well as elavil 50 mg at bedtime.    -hydrocodone prn  -dc ms contin after today 4. Mood: No signs of depression or anxiety noted. LCSW to follow for evaluation and support. Medications compared with care everywhere notes ( tegretol 500 mg tid, Trazodone 100 Mg/HS prn, cogentin 1 mg tid and haldol 5mg  tid) and patient's input conflicts with records.   Continue current klonopin, Seroquel and haldol as we are doing.   -outpt psych follow up  -mood appropriate at present 5. Neuropsych: This patient is capable of making decisions on her own behalf.  6. DM type 2--poorly controlled: Hgb A1c-10.4. She  reports that she used novolog tid ac and levemir 60 units at home.   -adjust levemir to 24 units bid given hypoglycemia/ adjust further as indicated.   -sugars better ---needs to eat consistently 7. Urinary retention: improved     8. Folliculitis:  keflex/abx completed 9. Renal cysts: Will need follow up with PMD on outpatient basis for workup.  10. Peripheral edema:  Encourage elevation. TEDs.   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 outpt follow up  LOS (Days) 16 A FACE TO FACE EVALUATION WAS PERFORMED  [name] T 05/14/2014 8:23 AM
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
## 2014-05-19 ENCOUNTER — Telehealth: Payer: Self-pay

## 2014-05-19 ENCOUNTER — Telehealth: Payer: Self-pay | Admitting: Physical Medicine and Rehabilitation

## 2014-05-19 NOTE — Telephone Encounter (Signed)
[facility] called to let us know that she had fallen trying to transfer.  Patient is also complaining of burning and pain.  PT says she did not get gabapentin rx.  Advised therapist to find paper work from discharge she should have an rx.  If not patient can call and make appointment.

## 2014-05-19 NOTE — Telephone Encounter (Signed)
Mother called [name], SW regarding [name]'s uncontrolled pain. She has gotten some hydrocodone from a family member to help with pain management. Advised her that hydrocodone and Vicodin are same medications and are not to be mixed to avoid overdose. It is against our office policy to get narcotics from other sources. Patient and mother stated that they did not get Rx for gabapentin and therefore could not get it filled. Called in Gabapentin 300 mg--one pill tid # 90/ no refills  to [facility] in [place].

## 2014-06-04 ENCOUNTER — Telehealth: Payer: Self-pay | Admitting: *Deleted

## 2014-06-04 DIAGNOSIS — G8220 Paraplegia, unspecified: Secondary | ICD-10-CM

## 2014-06-04 DIAGNOSIS — IMO0001 Reserved for inherently not codable concepts without codable children: Secondary | ICD-10-CM

## 2014-06-04 DIAGNOSIS — T796XXS Traumatic ischemia of muscle, sequela: Secondary | ICD-10-CM

## 2014-06-04 DIAGNOSIS — G729 Myopathy, unspecified: Secondary | ICD-10-CM

## 2014-06-04 MED ORDER — GABAPENTIN 300 MG PO CAPS: 300.0000 mg | ORAL_CAPSULE | Freq: Four times a day (QID) | ORAL | Status: DC

## 2014-06-04 NOTE — Telephone Encounter (Signed)
I spoke with Algis Liming PA and we are not going to prescribe narcotics for Ms Tangonan.  However sj=she did say that we can increase her gabapentin to 300mg  qid.  A new rx was sent to [facility].  I notified Haley Jone's PT [facility] on her confidential voicemail of the medication increase and non narcotic tx.

## 2014-06-04 NOTE — Telephone Encounter (Signed)
They are finishing up her HHPT  And she will need referral to [facility] for next week.  Ph # [phone].  She is also having a problem with pain and it is inhibiting their progress working with her.  She has been referred to a couple of pain clinics but have been rejected and is out of her pain medication.  Hildred Alamin is not sure what we can do but she wanted to let us know about the issue. I have paged Algis Liming PA to discuss.

## 2014-06-06 ENCOUNTER — Telehealth: Payer: Self-pay | Admitting: *Deleted

## 2014-06-06 MED ORDER — GABAPENTIN 300 MG PO CAPS: 300.0000 mg | ORAL_CAPSULE | Freq: Four times a day (QID) | ORAL | Status: DC

## 2014-06-06 NOTE — Telephone Encounter (Signed)
Hildred Alamin PT with [facility] called to check on the order for outpt PT at [facility] [facility], they do not have the order, and Kristii has not gotten the neurontin as of yet because pharmacy saying they do not have it.  I checked with the order and it went to the [facility] in [place] so resent to [facility] in [place].  Krisiti notified.

## 2014-06-13 ENCOUNTER — Telehealth: Payer: Self-pay | Admitting: *Deleted

## 2014-06-13 NOTE — Telephone Encounter (Signed)
Received call from [facility] about PT order for [name] saying that the code for her PT was wrong (958.6) traumatic rhabdomyolysis, saying it should be 728.88.  I faxed back a document showing the codes are correct in Epic and no changes made to order at this time. 728.88 is rhabdomyolysis but non traumatic.

## 2014-06-29 DIAGNOSIS — F329 Major depressive disorder, single episode, unspecified: Secondary | ICD-10-CM

## 2014-06-29 DIAGNOSIS — G5730 Lesion of lateral popliteal nerve, unspecified lower limb: Secondary | ICD-10-CM

## 2014-06-29 DIAGNOSIS — F3289 Other specified depressive episodes: Secondary | ICD-10-CM

## 2014-06-29 DIAGNOSIS — G729 Myopathy, unspecified: Secondary | ICD-10-CM

## 2014-06-29 DIAGNOSIS — M6282 Rhabdomyolysis: Secondary | ICD-10-CM

## 2014-07-04 ENCOUNTER — Encounter: Payer: Medicaid Other | Attending: Physical Medicine & Rehabilitation | Admitting: Physical Medicine & Rehabilitation

## 2015-09-01 ENCOUNTER — Encounter: Payer: Self-pay | Admitting: Neurology

## 2015-09-01 ENCOUNTER — Encounter: Payer: Self-pay | Admitting: *Deleted

## 2015-09-01 ENCOUNTER — Ambulatory Visit (INDEPENDENT_AMBULATORY_CARE_PROVIDER_SITE_OTHER): Payer: Medicaid Other | Admitting: Neurology

## 2015-09-01 VITALS — BP 127/93 | HR 112 | Ht 69.0 in | Wt 275.0 lb

## 2015-09-01 DIAGNOSIS — F445 Conversion disorder with seizures or convulsions: Secondary | ICD-10-CM

## 2015-09-01 NOTE — Patient Instructions (Signed)
Remember to drink plenty of fluid, eat healthy meals and do not skip any meals. Try to eat protein with a every meal and eat a healthy snack such as fruit or nuts in between meals. Try to keep a regular sleep-wake schedule and try to exercise daily, particularly in the form of walking, 20-30 minutes a day, if you can.   As far as diagnostic testing: 3-day eeg  I would like to see you back if needed, sooner if we need to. Please call us with any interim questions, concerns, problems, updates or refill requests.   Our phone number is [phone]. We also have an after hours call service for urgent matters and there is a physician on-call for urgent questions. For any emergencies you know to call 911 or go to the nearest emergency room  [facility]: [phone]  EXT [phone]

## 2015-09-01 NOTE — Progress Notes (Addendum)
[facility]    Provider:  Dr Jaynee Eagles Referring Provider: Suzan Garibaldi, FNP Primary Care Physician:  No primary care provider on file.  CC:  pseudoseizures  HPI:  [name] is a 39 y.o. 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  PMHx diabetes, bipolar, pseudoseizure. They started 12-14 years ago. Dr. Metta Clines and a neurologist at [facility] all told her they were pseudoseizures. She stayed inpatient and they saw one on extended eeg and it was not a seizures. These events changed 3 weeks ago and now they think maybe she does have seizures. Happens every day. Patient will close eyes, lay back in chair with mouth open. Sometimes she grunts. Then her legs shake. Not as bad as they used to be, she used to "bounce up and down" with these episodes. She went to church one Sunday and she had an event at church. They last for 30-40 seconds then she will sleep for 20-40 seconds then she wakes up like something scared her, abruptly jump and she is alert. She jumps briskly, no post-ictal confusion. No recent urination or defecation or tongue biting during event. It has been a long time since it happened outside the home however, only happens in her recliner. She was on crack but stopped 8 months ago. No FHx of seizures.  CT of the head 2015, personally reviewed images and agree with the following: 1. No acute intracranial abnormality. 2. Mild mucosal thickening with air-fluid level bilateral maxillary sinus. Mild mucosal thickening with partial opacification bilateral ethmoid air cells. 3. No cervical spine acute fracture or subluxation. No prevertebral soft tissue swelling. Cervical airway is patent.  CK 2015 150   Review of Systems: Patient complains of symptoms per HPI as well as the following symptoms: headaches, weigh gain, snoring, increased thirst, urination problems, headache, difficulty swallowing, seizure, anxiety, depression, decreased energey,  changein appetite, disinterest, racing thoughts, insomnia, sleepiness, restles legs. Pertinent negatives per HPI. All others negative.   Social History   Social History  . Marital Status: Single    Spouse Name: N/A  . Number of Children: 0  . Years of Education: 12   Occupational History  . Unemployed    Social History Main Topics  . Smoking status: Current Every Day Smoker -- 1.00 packs/day for 28 years  . Smokeless tobacco: Not on file  . Alcohol Use: Not on file     Comment: Quit drinking 8 months ago (09/01/15)  . Drug Use: Yes    Special: Cocaine  . Sexual Activity: Yes   Other Topics Concern  . Not on file   Social History Narrative   Lives at home mother.    Caffeine use: Soda (drinks 3L per day)    Family History  Problem Relation Age of Onset  . Hypertension Other   . Drug abuse Mother     Past Medical History  Diagnosis Date  . Diabetes mellitus without complication ([place])   . Hypertension   . Cocaine abuse   . Seizures ([place])   . Chronic combined systolic and diastolic CHF (congestive heart failure) ([place])     a. ECHO 04/23/14 LV EF: 45% - 50%,  mild LVH, diffuse hypokinesis. LV diastolic dysfunction, high ventricular filling pressure. Mild LA dilation.    Past Surgical History  Procedure Laterality Date  . Cholecystectomy  2005  . Tubal ligation      Current Outpatient Prescriptions  Medication Sig Dispense Refill  . amitriptyline (ELAVIL) 50 MG tablet  Take 1 tablet (50 mg total) by mouth at bedtime. 30 tablet 0  . bisacodyl (DULCOLAX) 10 MG suppository Place 1 suppository (10 mg total) rectally daily after supper. 30 suppository 0  . divalproex (DEPAKOTE) 500 MG DR tablet Take 1 tablet (500 mg total) by mouth every 8 (eight) hours. 90 tablet 0  . gabapentin (NEURONTIN) 300 MG capsule Take 1 capsule (300 mg total) by mouth 4 (four) times daily. 120 capsule 0  . insulin detemir (LEVEMIR) 100 UNIT/ML injection Inject 0.24 mLs (24 Units total) into the  skin at bedtime. 10 mL 11  . insulin lispro (HUMALOG) 100 UNIT/ML injection Inject 2-12 Units into the skin 3 (three) times daily with meals.    . Insulin Syringes, Disposable, U-100 0.3 ML MISC Inject 24 Units into the skin at bedtime. 100 each 0  . QUEtiapine (SEROQUEL) 100 MG tablet Take 1 tablet (100 mg total) by mouth at bedtime. 30 tablet 0   No current facility-administered medications for this visit.    Allergies as of 09/01/2015 - Review Complete 09/01/2015  Allergen Reaction Noted  . Albumin (human)  09/01/2015  . Erythromycin Rash 01/27/2014    Vitals: BP 127/93 mmHg  Pulse 112  Ht 5\' 9"  (1.753 m)  Wt 275 lb (124.739 kg)  BMI 40.59 kg/m2 Last Weight:  Wt Readings from Last 1 Encounters:  09/01/15 275 lb (124.739 kg)   Last Height:   Ht Readings from Last 1 Encounters:  09/01/15 5\' 9"  (1.753 m)    Physical exam: Exam: Gen: NAD, obese, poorly groomed                     CV: RRR, no MRG. No Carotid Bruits. No peripheral edema, warm, nontender Eyes: Conjunctivae clear without exudates or hemorrhage  Neuro: Detailed Neurologic Exam  Speech:    Speech is normal; fluent and spontaneous with normal comprehension.  Cognition:    The patient is oriented to person, place, and time;     recent and remote memory intact;     language fluent;     normal attention, concentration,     fund of knowledge Cranial Nerves:    The pupils are equal, round, and reactive to light. The fundi are flat. Visual fields are full to finger confrontation. Extraocular movements are intact. Trigeminal sensation is intact and the muscles of mastication are normal. The face is symmetric. The palate elevates in the midline. Hearing intact. Voice is normal. Shoulder shrug is normal. The tongue has normal motion without fasciculations.   Coordination:    No dysmetria  Gait:    Wide-based with braces  Motor Observation:    No asymmetry, no atrophy, and no involuntary movements  noted. Tone:    Normal muscle tone.    Posture:    Posture is normal. normal erect    Strength:     Strength is intact in the upper and lower limbs. Wears leg braces     Sensation: intact to LT     Reflex Exam:  DTR's:    Deep tendon reflexes in the upper and lower extremities are symmetrical bilaterally.  Wearing leg braces. Toes:    The toes are equvocal bilaterally.   Clonus:    Clonus is absent.    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 Events do not appear to be seizures. Still, people with non-epileptic events can also have a seizure d/o so to be sure will order an ambulatory EEG at  home. If she has them daily we should be able to capture one easily. External study ordered.  CC: cheryl Wells  Addendum:  video ambulatory EEG study recorded from 09/11/2015 to 09/12/2015. Stages 1-3 and REM sleep were observed. Patient had a couple arousals over the night and slept  about 12 hours. sleep spindles, vertex sharp waves and K complexes were all noted. 12 events were logged. No epileptiform discharges seen. No electrographic or electrographic clinical events present. No focal or background slowing seen.  Sarina Ill, MD  [facility] [address] [place], [place] [address]  Phone [phone] Fax [phone]

## 2015-09-01 NOTE — Progress Notes (Signed)
Sent referral for AMB EEG 72-hr to be scheduled with [facility]. Faxed to [phone]. Received fax confirmation.

## 2015-09-02 ENCOUNTER — Encounter: Payer: Self-pay | Admitting: *Deleted

## 2015-09-02 NOTE — Progress Notes (Signed)
Referral received by [facility] to schedule pt for 72-hr AMB EEG. Will contact our office once pt is scheduled.

## 2015-09-08 ENCOUNTER — Encounter: Payer: Self-pay | Admitting: *Deleted

## 2015-09-08 ENCOUNTER — Encounter: Payer: Self-pay | Admitting: Neurology

## 2015-09-08 NOTE — Progress Notes (Signed)
Referral status notification from [facility]: Pt scheduled 09/11/15-09/14/15 for 72-hr AMG EEG. Phone: [phone].

## 2015-09-21 ENCOUNTER — Encounter: Payer: Self-pay | Admitting: *Deleted

## 2015-09-21 NOTE — Progress Notes (Signed)
Received referral notification from [facility]: Study completed for AMB EEGx72 and notification will be sent to let us know when report scanned and generated for interpreting physician to review. Study may not be ready until 11/29 due to holiday.

## 2015-09-28 NOTE — Progress Notes (Signed)
Referral notification from [facility]: waiting on Dr Junius Argyle to complete interpretation and they will forward results to Dr Jaynee Eagles. Pt removed wires after 29 hr.

## 2015-10-02 ENCOUNTER — Telehealth: Payer: Self-pay | Admitting: Neurology

## 2015-10-02 NOTE — Telephone Encounter (Signed)
EEG completely normal. These are non-epileptic events or pseudoseizures. Spoke to patient. thnks

## 2017-02-20 DIAGNOSIS — R600 Localized edema: Secondary | ICD-10-CM | POA: Insufficient documentation

## 2017-02-20 DIAGNOSIS — G8929 Other chronic pain: Secondary | ICD-10-CM | POA: Insufficient documentation

## 2017-02-20 DIAGNOSIS — M79672 Pain in left foot: Secondary | ICD-10-CM

## 2018-01-10 DIAGNOSIS — M19279 Secondary osteoarthritis, unspecified ankle and foot: Secondary | ICD-10-CM | POA: Insufficient documentation

## 2018-01-10 DIAGNOSIS — M1992 Post-traumatic osteoarthritis, unspecified site: Secondary | ICD-10-CM | POA: Insufficient documentation

## 2018-01-18 ENCOUNTER — Other Ambulatory Visit
Admission: RE | Admit: 2018-01-18 | Discharge: 2018-01-18 | Disposition: A | Discharge status: Home / Self Care | Payer: Medicaid Other | Source: Ambulatory Visit | Attending: Nurse Practitioner | Admitting: Nurse Practitioner

## 2018-01-18 ENCOUNTER — Ambulatory Visit
Admission: RE | Admit: 2018-01-18 | Discharge: 2018-01-18 | Disposition: A | Discharge status: Home / Self Care | Payer: Medicaid Other | Source: Ambulatory Visit | Attending: Nurse Practitioner | Admitting: Nurse Practitioner

## 2018-01-18 ENCOUNTER — Encounter: Payer: Medicaid Other | Attending: Nurse Practitioner | Admitting: Nurse Practitioner

## 2018-01-18 ENCOUNTER — Other Ambulatory Visit: Payer: Self-pay | Admitting: Nurse Practitioner

## 2018-01-18 DIAGNOSIS — E11610 Type 2 diabetes mellitus with diabetic neuropathic arthropathy: Secondary | ICD-10-CM | POA: Diagnosis not present

## 2018-01-18 DIAGNOSIS — M7989 Other specified soft tissue disorders: Secondary | ICD-10-CM | POA: Insufficient documentation

## 2018-01-18 DIAGNOSIS — J449 Chronic obstructive pulmonary disease, unspecified: Secondary | ICD-10-CM | POA: Insufficient documentation

## 2018-01-18 DIAGNOSIS — E119 Type 2 diabetes mellitus without complications: Secondary | ICD-10-CM | POA: Insufficient documentation

## 2018-01-18 DIAGNOSIS — I509 Heart failure, unspecified: Secondary | ICD-10-CM | POA: Insufficient documentation

## 2018-01-18 DIAGNOSIS — L03032 Cellulitis of left toe: Secondary | ICD-10-CM | POA: Diagnosis not present

## 2018-01-18 DIAGNOSIS — I110 Hypertensive heart disease with heart failure: Secondary | ICD-10-CM | POA: Insufficient documentation

## 2018-01-18 DIAGNOSIS — S81802A Unspecified open wound, left lower leg, initial encounter: Secondary | ICD-10-CM

## 2018-01-18 DIAGNOSIS — E11621 Type 2 diabetes mellitus with foot ulcer: Secondary | ICD-10-CM | POA: Insufficient documentation

## 2018-01-18 DIAGNOSIS — F17210 Nicotine dependence, cigarettes, uncomplicated: Secondary | ICD-10-CM | POA: Diagnosis not present

## 2018-01-18 DIAGNOSIS — L97529 Non-pressure chronic ulcer of other part of left foot with unspecified severity: Secondary | ICD-10-CM | POA: Diagnosis not present

## 2018-01-18 DIAGNOSIS — S91102A Unspecified open wound of left great toe without damage to nail, initial encounter: Secondary | ICD-10-CM | POA: Insufficient documentation

## 2018-01-18 DIAGNOSIS — Z881 Allergy status to other antibiotic agents status: Secondary | ICD-10-CM | POA: Insufficient documentation

## 2018-01-19 NOTE — Progress Notes (Signed)
[name], [name] ([id number]) Visit Report for 01/18/2018 Abuse/Suicide Risk Screen Details Patient Name: [name], [name] Date of Service: 01/18/2018 12:30 PM Medical Record Number: [id number] Patient Account Number: [id number] Date of Birth/Sex: Mar 13, 1976 (42 y.o. F) Treating RN: Montey Hora Primary Care Leitha Hyppolite: Suzan Garibaldi Other Clinician: Referring Dequan Kindred: Suzan Garibaldi Treating Jordynn Perrier/Extender: Cathie Olden in Treatment: 0 Abuse/Suicide Risk Screen Items Answer ABUSE/SUICIDE RISK SCREEN: Has anyone close to you tried to hurt or harm you recentlyo No Do you feel uncomfortable with anyone in your familyo No Has anyone forced you do things that you didnot want to doo No Do you have any thoughts of harming yourselfo No Patient displays signs or symptoms of abuse and/or neglect. No Electronic Signature(s) Signed: 01/18/2018 4:03:23 PM By: Montey Hora Entered By: Montey Hora on 01/18/2018 12:47:35 [name] ([id number]) -------------------------------------------------------------------------------- Activities of Daily Living Details Patient Name: Heather Dear A. Date of Service: 01/18/2018 12:30 PM Medical Record Number: [id number] Patient Account Number: [id number] Date of Birth/Sex: [birth year] (42 y.o. F) Treating RN: Montey Hora Primary Care Jaidan Prevette: Suzan Garibaldi Other Clinician: Referring Eiliyah Reh: Suzan Garibaldi Treating Marilla Boddy/Extender: Cathie Olden in Treatment: 0 Activities of Daily Living Items Answer Activities of Daily Living (Please select one for each item) Drive Automobile Not Able Take Medications Completely Able Use Telephone Completely Able Care for Appearance Completely Able Use Toilet Completely Able Bath / Shower Need Assistance Dress Self Completely Able Feed Self Completely Able Walk Need Assistance Get In / Out Bed Completely [place] for Self Need Assistance Electronic Signature(s) Signed: 01/18/2018 4:03:23 PM By: Montey Hora Entered By: Montey Hora on 01/18/2018 12:48:08 [name] ([id number]) -------------------------------------------------------------------------------- Education Assessment Details Patient Name: Heather Dear A. Date of Service: 01/18/2018 12:30 PM Medical Record Number: [id number] Patient Account Number: [id number] Date of Birth/Sex: Sep 11, 1976 (42 y.o. F) Treating RN: Montey Hora Primary Care Letia Guidry: Suzan Garibaldi Other Clinician: Referring Zamzam Whinery: Suzan Garibaldi Treating Milanya Sunderland/Extender: Cathie Olden in Treatment: 0 Primary Learner Assessed: Patient Learning Preferences/Education Level/Primary Language Learning Preference: Explanation, Demonstration Highest Education Level: High School Preferred Language: English Cognitive Barrier Assessment/Beliefs Language Barrier: No Translator Needed: No Memory Deficit: No Emotional Barrier: No Cultural/Religious Beliefs Affecting Medical Care: No Physical Barrier Assessment Impaired Vision: No Impaired Hearing: No Decreased Hand dexterity: No Knowledge/Comprehension Assessment Knowledge Level: Medium Comprehension Level: Medium Ability to understand written Medium instructions: Ability to understand verbal Medium instructions: Motivation Assessment Anxiety Level: Calm Cooperation: Cooperative Education Importance: Acknowledges Need Interest in Health Problems: Asks Questions Perception: Coherent Willingness to Engage in Self- Medium Management Activities: Readiness to Engage in Self- Medium Management Activities: Electronic Signature(s) Signed: 01/18/2018 4:03:23 PM By: Montey Hora Entered By: Montey Hora on 01/18/2018 12:48:31 [name] ([id number]) -------------------------------------------------------------------------------- Fall Risk Assessment Details Patient  Name: Heather Dear A. Date of Service: 01/18/2018 12:30 PM Medical Record Number: [id number] Patient Account Number: [id number] Date of Birth/Sex: [birth year] (42 y.o. F) Treating RN: Montey Hora Primary Care Najeh Credit: Suzan Garibaldi Other Clinician: Referring Arlington Sigmund: Suzan Garibaldi Treating Modene Andy/Extender: Cathie Olden in Treatment: 0 Fall Risk Assessment Items Have you had 2 or more falls in the last 12 monthso 0 No Have you had any fall that resulted in injury in the last 12 monthso 0 No FALL RISK ASSESSMENT: History of falling - immediate or within 3 months 25 Yes Secondary diagnosis 0 No Ambulatory aid None/bed rest/wheelchair/nurse 0 Yes Crutches/cane/walker 15 Yes Furniture 0 No IV Access/Saline Lock  0 No Gait/Training Normal/bed rest/immobile 0 No Weak 10 Yes Impaired 0 No Mental Status Oriented to own ability 0 Yes Electronic Signature(s) Signed: 01/18/2018 4:03:23 PM By: Montey Hora Entered By: Montey Hora on 01/18/2018 12:48:51 [name] ([id number]) -------------------------------------------------------------------------------- Foot Assessment Details Patient Name: Heather Dear A. Date of Service: 01/18/2018 12:30 PM Medical Record Number: [id number] Patient Account Number: [id number] Date of Birth/Sex: [birth year] (42 y.o. F) Treating RN: Montey Hora Primary Care Nalee Lightle: Suzan Garibaldi Other Clinician: Referring Lekeya Rollings: Suzan Garibaldi Treating Cassady Turano/Extender: Cathie Olden in Treatment: 0 Foot Assessment Items Site Locations + = Sensation present, - = Sensation absent, C = Callus, U = Ulcer R = Redness, W = Warmth, M = Maceration, PU = Pre-ulcerative lesion F = Fissure, S = Swelling, D = Dryness Assessment Right: Left: Other Deformity: No No Prior Foot Ulcer: No No Prior Amputation: No No Charcot Joint: No No Ambulatory Status: Non-ambulatory Assistance Device: Wheelchair Gait: Steady Electronic  Signature(s) Signed: 01/18/2018 4:03:23 PM By: Montey Hora Entered By: Montey Hora on 01/18/2018 12:49:56 Kirkeby, Genevieve AMarland Kitchen ([id number]) -------------------------------------------------------------------------------- Nutrition Risk Assessment Details Patient Name: Heather Dear A. Date of Service: 01/18/2018 12:30 PM Medical Record Number: [id number] Patient Account Number: [id number] Date of Birth/Sex: 1976/02/25 (42 y.o. F) Treating RN: Montey Hora Primary Care Madhav Mohon: Suzan Garibaldi Other Clinician: Referring Jo Cerone: Suzan Garibaldi Treating  Norment/Extender: Cathie Olden in Treatment: 0 Height (in): Weight (lbs): Body Mass Index (BMI): Nutrition Risk Assessment Items NUTRITION RISK SCREEN: I have an illness or condition that made me change the kind and/or amount of 0 No food I eat I eat fewer than two meals per day 0 No I eat few fruits and vegetables, or milk products 0 No I have three or more drinks of beer, liquor or wine almost every day 0 No I have tooth or mouth problems that make it hard for me to eat 0 No I don't always have enough money to buy the food I need 0 No I eat alone most of the time 0 No I take three or more different prescribed or over-the-counter drugs a day 1 Yes Without wanting to, I have lost or gained 10 pounds in the last six months 0 No I am not always physically able to shop, cook and/or feed myself 0 No Nutrition Protocols Good Risk Protocol 0 No interventions needed Moderate Risk Protocol Electronic Signature(s) Signed: 01/18/2018 4:03:23 PM By: Montey Hora Entered By: Montey Hora on 01/18/2018 12:48:56

## 2018-01-20 NOTE — Progress Notes (Signed)
MAKELL, DROHAN ([id number]) Visit Report for 01/18/2018 Allergy List Details Patient Name: [name], [name] Date of Service: 01/18/2018 12:30 PM Medical Record Number: [id number] Patient Account Number: [id number] Date of Birth/Sex: 1976/03/27 (42 y.o. F) Treating RN: [name] Primary Care [name]: [name] Other Clinician: Referring [name]: [name] Treating [name]/Extender: [name] Weeks in Treatment: 0 Allergies Active Allergies erythromycin base Allergy Notes Electronic Signature(s) Signed: 01/18/2018 4:03:23 PM By: [name] Entered By: [name] on 01/18/2018 12:47:16 [name] ([id number]) -------------------------------------------------------------------------------- Arrival Information Details Patient Name: Heather Dear A. Date of Service: 01/18/2018 12:30 PM Medical Record Number: [id number] Patient Account Number: [id number] Date of Birth/Sex: 06/16/1976 (42 y.o. F) Treating RN: [name] Primary Care Samyukta Cura: [name] Other Clinician: Referring [name]: [name] Treating [name]/Extender: [name] in Treatment: 0 Visit Information Patient Arrived: Wheel Chair Arrival Time: 12:37 Accompanied By: dtr Transfer Assistance: None Patient Identification Verified: Yes Secondary Verification Process Completed: Yes Patient Has Alerts: Yes Patient Alerts: DMII Electronic Signature(s) Signed: 01/18/2018 4:03:23 PM By: [name] Entered By: [name] on 01/18/2018 12:43:48 [name] ([id number]) -------------------------------------------------------------------------------- Clinic Level of Care Assessment Details Patient Name: Heather Dear A. Date of Service: 01/18/2018 12:30 PM Medical Record Number: [id number] Patient Account Number: [id number] Date of Birth/Sex: [birth year] (42 y.o. F) Treating RN: [name] Primary Care [name]: [name] Other Clinician: Referring  [name]: [name] Treating [name]/Extender: [name] in Treatment: 0 Clinic Level of Care Assessment Items TOOL 1 Quantity Score X - Use when EandM and Procedure is performed on INITIAL visit 1 0 ASSESSMENTS - Nursing Assessment / Reassessment X - General Physical Exam (combine w/ comprehensive assessment (listed just below) when 1 20 performed on new pt. evals) X- 1 25 Comprehensive Assessment (HX, ROS, Risk Assessments, Wounds Hx, etc.) ASSESSMENTS - Wound and Skin Assessment / Reassessment []  - Dermatologic / Skin Assessment (not related to wound area) 0 ASSESSMENTS - Ostomy and/or Continence Assessment and Care []  - Incontinence Assessment and Management 0 []  - 0 Ostomy Care Assessment and Management (repouching, etc.) PROCESS - Coordination of Care X - Simple Patient / Family Education for ongoing care 1 15 []  - 0 Complex (extensive) Patient / Family Education for ongoing care []  - 0 Staff obtains Programmer, systems, Records, Test Results / Process Orders []  - 0 Staff telephones HHA, Nursing Homes / Clarify orders / etc []  - 0 Routine Transfer to another Facility (non-emergent condition) []  - 0 Routine Hospital Admission (non-emergent condition) X- 1 15 New Admissions / Biomedical engineer / Ordering NPWT, Apligraf, etc. []  - 0 Emergency Hospital Admission (emergent condition) PROCESS - Special Needs []  - Pediatric / Minor Patient Management 0 []  - 0 Isolation Patient Management []  - 0 Hearing / Language / Visual special needs []  - 0 Assessment of Community assistance (transportation, D/C planning, etc.) []  - 0 Additional assistance / Altered mentation []  - 0 Support Surface(s) Assessment (bed, cushion, seat, etc.) [name], [name] ([id number]) INTERVENTIONS - Miscellaneous []  - External ear exam 0 []  - 0 Patient Transfer (multiple staff / Civil Service fast streamer / Similar devices) []  - 0 Simple Staple / Suture removal (25 or less) []  - 0 Complex Staple /  Suture removal (26 or more) []  - 0 Hypo/Hyperglycemic Management (do not check if billed separately) X- 1 15 Ankle / Brachial Index (ABI) - do not check if billed separately Has the patient been seen at the hospital within the last three years: Yes Total Score: 90 Level Of Care:  New/Established - Level 3 Electronic Signature(s) Signed: 01/18/2018 4:15:03 PM By: Alric Quan Entered By: Alric Quan on 01/18/2018 15:22:27 [name] ([id number]) -------------------------------------------------------------------------------- Encounter Discharge Information Details Patient Name: Heather Dear A. Date of Service: 01/18/2018 12:30 PM Medical Record Number: [id number] Patient Account Number: [id number] Date of Birth/Sex: 1976-01-24 (42 y.o. F) Treating RN: [name] Primary Care Marvin Maenza: [name] Other Clinician: Referring [name]: [name] Treating [name]/Extender: [name] in Treatment: 0 Encounter Discharge Information Items Discharge Pain Level: 0 Discharge Condition: Stable Ambulatory Status: Wheelchair Discharge Destination: Home Transportation: Private Auto Accompanied By: [name] Schedule Follow-up Appointment: Yes Medication Reconciliation completed and Yes provided to Patient/Care [name]: Provided on Clinical Summary of Care: 01/18/2018 Form Type Recipient Paper Patient [place] Electronic Signature(s) Signed: 01/19/2018 1:03:41 PM By: [name], BSN, RN, CWS, [name] RN, BSN Entered By: [name], BSN, RN, CWS, [name] on 01/18/2018 13:41:41 [name] ([id number]) -------------------------------------------------------------------------------- General Visit Notes Details Patient Name: Heather Dear A. Date of Service: 01/18/2018 12:30 PM Medical Record Number: [id number] Patient Account Number: [id number] Date of Birth/Sex: [birth year] (42 y.o. F) Treating RN: [name] Primary Care Leiya Keesey: [name] Other Clinician: Referring  [name]: [name] Treating [name]/Extender: [name] in Treatment: 0 Notes Patient arrived via wheelchair being pushed by her mother. The 2 were arguing and patient's mother was staggering. Her mother pushed her w/c into the cabinet and knocked over the rulers and cotton tipped applicators to the floor. I asked if she was alright and it became apparent that she was under the influence of something. I asked her and patient what she was taking and mother did not respond and patient states that she does not know. Mother was slumped in the chair in the corner of the room leaning forward about to fall into the floor. I roused her and stayed with her until she could be escorted out of the room with the help of Maudie Mercury and Malachy Mood. Electronic Signature(s) Signed: 01/18/2018 1:19:17 PM By: [name] Entered By: [name] on 01/18/2018 13:19:16 [name] ([id number]) -------------------------------------------------------------------------------- Lower Extremity Assessment Details Patient Name: Heather Dear A. Date of Service: 01/18/2018 12:30 PM Medical Record Number: [id number] Patient Account Number: [id number] Date of Birth/Sex: [birth year] (42 y.o. F) Treating RN: [name] Primary Care Vollie Brunty: [name] Other Clinician: Referring Eli Adami: [name] Treating Josealberto Montalto/Extender: [name] in Treatment: 0 Edema Assessment Assessed: [Left: No] [Right: No] Edema: [Left: Yes] [Right: No] Vascular Assessment Pulses: Dorsalis Pedis Palpable: [Left:Yes] [Right:Yes] Posterior Tibial Palpable: [Left:Yes] [Right:Yes] Extremity colors, hair growth, and conditions: Extremity Color: [Left:Normal] [Right:Normal] Hair Growth on Extremity: [Left:Yes] [Right:Yes] Temperature of Extremity: [Left:Warm] [Right:Warm] Capillary Refill: [Left:< 3 seconds] [Right:< 3 seconds] Blood Pressure: Brachial: [Left:98] [Right:98] Dorsalis Pedis:  110 [Left:Dorsalis Pedis: 122] Ankle: Posterior Tibial: 106 [Left:Posterior Tibial: 130 1.12] [Right:1.33] Toe Nail Assessment Left: Right: Thick: Yes Yes Discolored: Yes Yes Deformed: No No Improper Length and Hygiene: No No Electronic Signature(s) Signed: 01/18/2018 4:03:23 PM By: [name] Entered By: [name] on 01/18/2018 13:04:05 [name], [name] ([id number]) -------------------------------------------------------------------------------- Multi Wound Chart Details Patient Name: Heather Dear A. Date of Service: 01/18/2018 12:30 PM Medical Record Number: [id number] Patient Account Number: [id number] Date of Birth/Sex: [birth year] (42 y.o. F) Treating RN: [name] Primary Care Albirta Rhinehart: [name] Other Clinician: Referring Shazia Mitchener: [name] Treating Josede Cicero/Extender: [name] in Treatment: 0 Vital Signs Height(in): 69 Pulse(bpm): 87 Weight(lbs): 250 Blood Pressure(mmHg): 98/62 Body Mass Index(BMI): 37 Temperature(F): 98.6 Respiratory Rate 16 (breaths/min): Photos: [1:No Photos] [N/A:N/A]  Wound Location: [1:Left Toe Great] [N/A:N/A] Wounding Event: [1:Gradually Appeared] [N/A:N/A] Primary Etiology: [1:Diabetic Wound/Ulcer of the N/A Lower Extremity] Comorbid History: [1:Chronic Obstructive Pulmonary Disease (COPD), Congestive Heart Failure, Hypertension, Type II Diabetes, Neuropathy, Seizure Disorder] [N/A:N/A] Date Acquired: [1:01/01/2018] [N/A:N/A] Weeks of Treatment: [1:0] [N/A:N/A] Wound Status: [1:Open] [N/A:N/A] Pending Amputation on [1:Yes] [N/A:N/A] Presentation: Measurements L x W x D [1:1.9x5.1x0.4] [N/A:N/A] (cm) Area (cm) : [1:7.611] [N/A:N/A] Volume (cm) : [1:3.044] [N/A:N/A] % Reduction in Area: [1:-104.00%] [N/A:N/A] % Reduction in Volume: [1:-104.00%] [N/A:N/A] Classification: [1:Grade 2] [N/A:N/A] Exudate Amount: [1:Large] [N/A:N/A] Exudate Type: [1:Serous] [N/A:N/A] Exudate Color: [1:amber]  [N/A:N/A] Foul Odor After Cleansing: [1:Yes] [N/A:N/A] Odor Anticipated Due to [1:No] [N/A:N/A] Product Use: Wound Margin: [1:Flat and Intact] [N/A:N/A] Granulation Amount: [1:Medium (34-66%)] [N/A:N/A] Granulation Quality: [1:Red, Pink] [N/A:N/A] Necrotic Amount: [1:Medium (34-66%)] [N/A:N/A] Necrotic Tissue: [1:Eschar, Adherent Slough] [N/A:N/A] Exposed Structures: [1:Fat Layer (Subcutaneous Tissue) Exposed: Yes Fascia: No] [N/A:N/A] Tendon: No Muscle: No Joint: No Bone: No Epithelialization: None N/A N/A Periwound Skin Texture: Excoriation: No N/A N/A Induration: No Callus: No Crepitus: No Rash: No Scarring: No Periwound Skin Moisture: Maceration: No N/A N/A Dry/Scaly: No Periwound Skin Color: Erythema: Yes N/A N/A Atrophie Blanche: No Cyanosis: No Ecchymosis: No Hemosiderin Staining: No Mottled: No Pallor: No Rubor: No Erythema Location: Circumferential N/A N/A Temperature: No Abnormality N/A N/A Tenderness on Palpation: No N/A N/A Wound Preparation: Ulcer Cleansing: N/A N/A Rinsed/Irrigated with Saline Topical Anesthetic Applied: Other: lidocaine 4% Assessment Notes: toe is swollen and red N/A N/A Treatment Notes Electronic Signature(s) Signed: 01/18/2018 4:03:23 PM By: [name] Entered By: [name] on 01/18/2018 13:12:10 [name] ([id number]) -------------------------------------------------------------------------------- [place] Details Patient Name: Heather Dear A. Date of Service: 01/18/2018 12:30 PM Medical Record Number: [id number] Patient Account Number: [id number] Date of Birth/Sex: 08/14/1976 (42 y.o. F) Treating RN: [name] Primary Care Alastair Hennes: [name] Other Clinician: Referring Lianette Broussard: [name] Treating Jamarri Vuncannon/Extender: [name] in Treatment: 0 Active Inactive ` Abuse / Safety / Falls / Self Care Management Nursing Diagnoses: Potential for falls Goals: Patient  will not experience any injury related to falls Date Initiated: 01/18/2018 Target Resolution Date: 04/28/2018 Goal Status: Active Interventions: Assess fall risk on admission and as needed Assess: immobility, friction, shearing, incontinence upon admission and as needed Assess impairment of mobility on admission and as needed per policy Assess personal safety and home safety (as indicated) on admission and as needed Assess self care needs on admission and as needed Notes: ` Nutrition Nursing Diagnoses: Imbalanced nutrition Impaired glucose control: actual or potential Potential for alteratiion in Nutrition/Potential for imbalanced nutrition Goals: Patient/caregiver agrees to and verbalizes understanding of need to use nutritional supplements and/or vitamins as prescribed Date Initiated: 01/18/2018 Target Resolution Date: 04/28/2018 Goal Status: Active Patient/caregiver will maintain therapeutic glucose control Date Initiated: 01/18/2018 Target Resolution Date: 04/28/2018 Goal Status: Active Interventions: Assess patient nutrition upon admission and as needed per policy Provide education on elevated blood sugars and impact on wound healing Notes: [name], [name] ([id number]) Orientation to the Wound Care Program Nursing Diagnoses: Knowledge deficit related to the wound healing center program Goals: Patient/caregiver will verbalize understanding of the [facility] Date Initiated: 01/18/2018 Target Resolution Date: 01/27/2018 Goal Status: Active Interventions: Provide education on orientation to the wound center Notes: ` Wound/Skin Impairment Nursing Diagnoses: Impaired tissue integrity Knowledge deficit related to smoking impact on wound healing Knowledge deficit related to ulceration/compromised skin integrity Goals: Ulcer/skin breakdown will have a volume reduction of 80% by week  12 Date Initiated: 01/18/2018 Target Resolution Date: 04/21/2018 Goal Status:  Active Interventions: Assess patient/caregiver ability to perform ulcer/skin care regimen upon admission and as needed Assess ulceration(s) every visit Notes: Electronic Signature(s) Signed: 01/18/2018 4:03:23 PM By: [name] Entered By: [name] on 01/18/2018 13:11:56 [name], [name] ([id number]) -------------------------------------------------------------------------------- Pain Assessment Details Patient Name: Heather Dear A. Date of Service: 01/18/2018 12:30 PM Medical Record Number: [id number] Patient Account Number: [id number] Date of Birth/Sex: 05/05/1976 (42 y.o. F) Treating RN: [name] Primary Care Kameren Pargas: [name] Other Clinician: Referring Symiah Nowotny: [name] Treating Marco Adelson/Extender: [name] in Treatment: 0 Active Problems Location of Pain Severity and Description of Pain Patient Has Paino No Site Locations Pain Management and Medication Current Pain Management: Electronic Signature(s) Signed: 01/18/2018 4:03:23 PM By: [name] Entered By: [name] on 01/18/2018 12:46:33 [name] ([id number]) -------------------------------------------------------------------------------- Patient/Caregiver Education Details Patient Name: Heather Dear A. Date of Service: 01/18/2018 12:30 PM Medical Record Number: [id number] Patient Account Number: [id number] Date of Birth/Gender: Sep 27, 1976 (42 y.o. F) Treating RN: Cornell Barman Primary Care Physician: [name] Other Clinician: Referring Physician: Suzan [name] Treating Physician/Extender: [name] in Treatment: 0 Education Assessment Education Provided To: Patient Education Topics Provided Welcome To [facility]: Handouts: Welcome To [facility] [place] Methods: Explain/Verbal Responses: State content correctly Wound Debridement: Handouts: Wound Debridement Methods: Demonstration, Explain/Verbal Responses: State content  correctly Wound/Skin Impairment: Handouts: Caring for Your Ulcer Methods: Demonstration, Explain/Verbal Responses: State content correctly Electronic Signature(s) Signed: 01/19/2018 1:03:41 PM By: [name], BSN, RN, CWS, [name] RN, BSN Entered By: [name], BSN, RN, CWS, [name] on 01/18/2018 13:42:09 [name] ([id number]) -------------------------------------------------------------------------------- Wound Assessment Details Patient Name: Heather Dear A. Date of Service: 01/18/2018 12:30 PM Medical Record Number: [id number] Patient Account Number: [id number] Date of Birth/Sex: [birth year] (42 y.o. F) Treating RN: [name] Primary Care Scotland Korver: [name] Other Clinician: Referring Danira Nylander: [name] Treating Yittel Emrich/Extender: [name] in Treatment: 0 Wound Status Wound Number: 1 Primary Diabetic Wound/Ulcer of the Lower Extremity Etiology: Wound Location: Left Toe Great Wound Open Wounding Event: Gradually Appeared Status: Date Acquired: 01/01/2018 Comorbid Chronic Obstructive Pulmonary Disease Weeks Of Treatment: 0 History: (COPD), Congestive Heart Failure, Clustered Wound: No Hypertension, Type II Diabetes, Neuropathy, Pending Amputation On Presentation Seizure Disorder Photos Photo Uploaded By: [name] on 01/18/2018 13:13:15 Wound Measurements Length: (cm) 1.9 Width: (cm) 5.1 Depth: (cm) 0.4 Area: (cm) 7.611 Volume: (cm) 3.044 % Reduction in Area: -104% % Reduction in Volume: -104% Epithelialization: None Tunneling: No Undermining: No Wound Description Classification: Grade 2 Wound Margin: Flat and Intact Exudate Amount: Large Exudate Type: Serous Exudate Color: amber Foul Odor After Cleansing: Yes Due to Product Use: No Slough/Fibrino Yes Wound Bed Granulation Amount: Medium (34-66%) Exposed Structure Granulation Quality: Red, Pink Fascia Exposed: No Necrotic Amount: Medium (34-66%) Fat Layer (Subcutaneous Tissue)  Exposed: Yes Necrotic Quality: Eschar, Adherent Slough Tendon Exposed: No Muscle Exposed: No Joint Exposed: No Bone Exposed: No [name], [name] ([id number]) Periwound Skin Texture Texture Color No Abnormalities Noted: No No Abnormalities Noted: No Callus: No Atrophie Blanche: No Crepitus: No Cyanosis: No Excoriation: No Ecchymosis: No Induration: No Erythema: Yes Rash: No Erythema Location: Circumferential Scarring: No Hemosiderin Staining: No Mottled: No Moisture Pallor: No No Abnormalities Noted: No Rubor: No Dry / Scaly: No Maceration: No Temperature / Pain Temperature: No Abnormality Wound Preparation Ulcer Cleansing: Rinsed/Irrigated with Saline Topical Anesthetic Applied: Other: lidocaine 4%, Assessment Notes toe is swollen and red Treatment Notes Wound #1 (Left  Toe Great) 1. Cleansed with: Clean wound with Normal Saline 2. Anesthetic Topical Lidocaine 4% cream to wound bed prior to debridement 4. Dressing Applied: Santyl Ointment 5. Secondary Dressing Applied Aquacel Ag 6. Footwear/Offloading device applied Surgical shoe 7. Secured with Tape Notes Conform and tape Electronic Signature(s) Signed: 01/18/2018 4:03:23 PM By: [name] Entered By: [name] on 01/18/2018 13:04:40 [name] ([id number]) -------------------------------------------------------------------------------- [place] Details Patient Name: Heather Dear A. Date of Service: 01/18/2018 12:30 PM Medical Record Number: [id number] Patient Account Number: [id number] Date of Birth/Sex: 10-20-1976 (42 y.o. F) Treating RN: [name] Primary Care Kamarri Fischetti: [name] Other Clinician: Referring Jabaree Mercado: [name] Treating Haadi Santellan/Extender: [name] in Treatment: 0 Vital Signs Time Taken: 12:54 Temperature (F): 98.6 Height (in): 69 Pulse (bpm): 87 Source: Measured Respiratory Rate (breaths/min): 16 Weight (lbs): 250 Blood Pressure (mmHg):  98/62 Source: Measured Reference Range: 80 - 120 mg / dl Body Mass Index (BMI): 36.9 Electronic Signature(s) Signed: 01/18/2018 4:03:23 PM By: [name] Entered By: [name] on 01/18/2018 12:55:02

## 2018-01-20 NOTE — Progress Notes (Addendum)
[name], [name] ([id number]) Visit Report for 01/18/2018 Chief Complaint Document Details Patient Name: [name], [name] Date of Service: 01/18/2018 12:30 PM Medical Record Number: [id number] Patient Account Number: [id number] Date of Birth/Sex: 01/08/1976 (42 y.o. F) Treating RN: Ahmed Prima Primary Care Provider: Suzan Garibaldi Other Clinician: Referring Provider: Suzan Garibaldi Treating Provider/Extender: Cathie Olden in Treatment: 0 Information Obtained from: Patient Chief Complaint She is here for evaluation of a left great toe ulcer Electronic Signature(s) Signed: 01/18/2018 3:38:57 PM By: Lawanda Cousins Entered By: Lawanda Cousins on 01/18/2018 15:38:57 [name], [name] ([id number]) -------------------------------------------------------------------------------- Debridement Details Patient Name: Heather Dear A. Date of Service: 01/18/2018 12:30 PM Medical Record Number: [id number] Patient Account Number: [id number] Date of Birth/Sex: 11-09-1975 (42 y.o. F) Treating RN: Ahmed Prima Primary Care Provider: Suzan Garibaldi Other Clinician: Referring Provider: Suzan Garibaldi Treating Provider/Extender: Cathie Olden in Treatment: 0 Debridement Performed for Wound #1 Left Toe Great Assessment: Performed By: Physician Lawanda Cousins, NP Debridement Type: Debridement Severity of Tissue Pre Fat layer exposed Debridement: Pre-procedure Verification/Time Yes - 13:13 Out Taken: Start Time: 13:14 Pain Control: Lidocaine 4% Topical Solution Total Area Debrided (L x W): 1.9 (cm) x 5.1 (cm) = 9.69 (cm) Tissue and other material Viable, Non-Viable, Slough, Subcutaneous, Skin: Dermis , Skin: Epidermis, Fibrin/Exudate, debrided: Slough Level: Skin/Subcutaneous Tissue Debridement Description: Excisional Instrument: Blade, Forceps Specimen: Swab, Number of Specimens Taken: 1 Bleeding: Minimum Hemostasis Achieved: Pressure End Time: 13:21 Procedural Pain: 0 Post  Procedural Pain: 0 Response to Treatment: Procedure was tolerated well Post Debridement Measurements of Total Wound Length: (cm) 1.9 Width: (cm) 5.1 Depth: (cm) 0.5 Volume: (cm) 3.805 Character of Wound/Ulcer Post Debridement: Requires Further Debridement Severity of Tissue Post Debridement: Fat layer exposed Post Procedure Diagnosis Same as Pre-procedure Electronic Signature(s) Signed: 01/18/2018 3:38:38 PM By: Lawanda Cousins Signed: 01/18/2018 4:15:03 PM By: Alric Quan Entered By: Lawanda Cousins on 01/18/2018 15:38:38 [name], [name] ([id number]) -------------------------------------------------------------------------------- HPI Details Patient Name: Heather Dear A. Date of Service: 01/18/2018 12:30 PM Medical Record Number: [id number] Patient Account Number: [id number] Date of Birth/Sex: 1975-11-21 (42 y.o. F) Treating RN: Ahmed Prima Primary Care Provider: Suzan Garibaldi Other Clinician: Referring Provider: Suzan Garibaldi Treating Provider/Extender: Cathie Olden in Treatment: 0 History of Present Illness HPI Description: 01/18/18-She is here for initial evaluation of the left great toe ulcer. She is a poor historian in regards to timeframe in detail. She states approximately 4 weeks ago she lacerated her toe on something in the house. She followed up with her primary care who placed her on Bactrim and ultimately a second dose of Bactrim prior to coming to wound clinic. She states she has been treating the toe with peroxide, Betadine and a Band-Aid. She did not check her blood sugar this morning but checked it yesterday morning it was 327; she is unaware of a recent A1c and there are no current records. She saw Dr. she would've orthopedics last week for an old injury to the left ankle, she states he did not see her toe, nor did she bring it to his attention. She smokes approximately 1 pack cigarettes a day. Her social situation is concerning, she arrives this  morning with her mother who appears extremely intoxicated/under the influence; her mother was asked to leave the room and be monitored by the patient's grandmother. The patient's aunt then accompanied the patient and the room throughout the rest of the appointment. We had a lengthy discussion regarding the deleterious effects of uncontrolled hyperglycemia and smoking as it relates  to wound healing and overall health. She was strongly encouraged to decrease her smoking and get her diabetes under better control. She states she is currently on a diet and has cut down her [facility] consumption. The left toe is erythematous, macerated and slightly edematous with malodor present. The edema in her left foot is below her baseline, there is no erythema streaking. We will treat her with Santyl, doxycycline; we have ordered and xray, culture and provided a Peg assist surgical shoe and cultured the wound. Electronic Signature(s) Signed: 01/18/2018 3:44:32 PM By: Lawanda Cousins Entered By: Lawanda Cousins on 01/18/2018 15:44:32 [name] ([id number]) -------------------------------------------------------------------------------- Physical Exam Details Patient Name: Heather Dear A. Date of Service: 01/18/2018 12:30 PM Medical Record Number: [id number] Patient Account Number: [id number] Date of Birth/Sex: Sep 16, 1976 (42 y.o. F) Treating RN: Ahmed Prima Primary Care Provider: Suzan Garibaldi Other Clinician: Referring Provider: Suzan Garibaldi Treating Provider/Extender: Cathie Olden in Treatment: 0 Respiratory respirations are even and unlabored. clear throughout. Cardiovascular S1 S2 with regular rate and rhythm. LLE- palpable DP, PT. LLE- pedal edema, xerosis,. Musculoskeletal arrived in wheelchair. Psychiatric appears to have poor insight and/or judgement; poor historian. oriented x4. Marland Kitchen Electronic Signature(s) Signed: 01/18/2018 3:58:29 PM By: Lawanda Cousins Entered By: Lawanda Cousins  on 01/18/2018 15:58:29 [name] ([id number]) -------------------------------------------------------------------------------- Physician Orders Details Patient Name: Heather Dear A. Date of Service: 01/18/2018 12:30 PM Medical Record Number: [id number] Patient Account Number: [id number] Date of Birth/Sex: 15-Mar-1976 (42 y.o. F) Treating RN: Montey Hora Primary Care Provider: Suzan Garibaldi Other Clinician: Referring Provider: Suzan Garibaldi Treating Provider/Extender: Cathie Olden in Treatment: 0 Verbal / Phone Orders: Yes Clinician: Montey Hora Read Back and Verified: Yes Diagnosis Coding Wound Cleansing Wound #1 Left Toe Great o Clean wound with Normal Saline. Anesthetic (add to Medication List) Wound #1 Left Toe Great o Topical Lidocaine 4% cream applied to wound bed prior to debridement (In Clinic Only). Primary Wound Dressing Wound #1 Left Toe Great o Santyl Ointment Secondary Dressing Wound #1 Left Toe Great o Dry Gauze o Conform/Kerlix Dressing Change Frequency Wound #1 Left Toe Great o Change dressing every day. Follow-up Appointments Wound #1 Left Toe Great o Return Appointment in 1 week. Edema Control Wound #1 Left Toe Great o Patient to wear own compression stockings o Elevate legs to the level of the heart and pump ankles as often as possible Off-Loading Wound #1 Left Toe Great o Open toe surgical shoe with peg assist. Additional Orders / Instructions Wound #1 Left Toe Great o Stop Smoking o Increase protein intake. Laboratory [name], [name] ([id number]) o Bacteria identified in Wound by Culture (MICRO) oooo LOINC Code: [phone] oooo Convenience Name: Wound culture routine Radiology o X-ray, foot Patient Medications Allergies: erythromycin base Notifications Medication Indication Start End lidocaine DOSE 1 - topical 4 % cream - 1 cream topical Santyl 01/19/2018 DOSE topical 250 unit/gram ointment -  ointment topical; nickel thick amount to ulcer daily doxycycline monohydrate 01/18/2018 DOSE oral 100 mg tablet - tablet oral twice daily for 14 days Augmentin 01/22/2018 DOSE 1 - oral 875 mg-125 mg tablet - 1 tablet oral taken 2 times a day for 10 days Electronic Signature(s) Signed: 01/22/2018 4:43:42 PM By: Worthy Keeler PA-C Signed: 03/23/2018 6:55:59 AM By: Lawanda Cousins Previous Signature: 01/18/2018 1:56:17 PM Version By: Lawanda Cousins Entered By: Worthy Keeler on 01/22/2018 16:43:40 [name] ([id number]) -------------------------------------------------------------------------------- Prescription 01/18/2018 Patient Name: Heather Dear A. Provider: Lawanda Cousins NP Date of Birth: 05/31/1976 NPI#: [id number] Sex:  Wanda Plump DEA#: [id number] Phone #: [phone] License #: Patient Address: [place], [place] [address] [address] [place], [place] [address] [phone] Allergies erythromycin base Medication Medication: Route: Strength: Form: lidocaine 4 % topical cream topical 4% cream Class: TOPICAL LOCAL ANESTHETICS Dose: Frequency / Time: Indication: 1 1 cream topical Number of Refills: Number of Units: 0 Generic Substitution: Start Date: End Date: One Time Use: Substitution Permitted No Note to Pharmacy: Signature(s): Date(s): Electronic Signature(s) Signed: 01/23/2018 8:40:52 AM By: Worthy Keeler PA-C Signed: 03/23/2018 6:55:59 AM By: Lawanda Cousins Previous Signature: 01/18/2018 9:02:48 PM Version By: Lawanda Cousins Entered By: Worthy Keeler on 01/22/2018 16:43:42 [name], [name] ([id number]) --------------------------------------------------------------------------------  Problem List Details Patient Name: Heather Dear A. Date of Service: 01/18/2018 12:30 PM Medical Record Number: [id number] Patient Account Number: [id number] Date of Birth/Sex:  11/02/1975 (42 y.o. 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 ([id number]) -------------------------------------------------------------------------------- Progress Note Details Patient Name: Heather Dear A. Date of Service: 01/18/2018 12:30 PM Medical Record Number: [id number] Patient Account Number: [id number] Date of Birth/Sex: 10-30-1975 (42 y.o. F) Treating RN: Ahmed Prima Primary Care Provider: Suzan Garibaldi Other Clinician: Referring Provider: Suzan Garibaldi Treating Provider/Extender: Cathie Olden in Treatment: 0 Subjective Chief Complaint Information obtained from Patient She is here for evaluation of a left great toe ulcer History of Present Illness (HPI) 01/18/18-She is here for initial evaluation of the left great toe ulcer. She is a poor historian in regards to timeframe in detail. She states approximately 4 weeks ago she lacerated her toe on something in the house. She followed up with her primary care who placed her on Bactrim and ultimately a second dose of Bactrim prior to coming to wound clinic. She states she has been treating the toe with peroxide, Betadine and a Band-Aid. She did not check her blood sugar this morning but checked it yesterday morning it was 327;  she is unaware of a recent A1c and there are no current records. She saw Dr. she would've orthopedics last week for an old injury to the left ankle, she states he did not see her toe, nor did she bring it to his attention. She smokes approximately 1 pack cigarettes a day. Her social situation is concerning, she arrives this morning with her mother who appears extremely intoxicated/under the influence; her mother was asked to leave the room and be monitored by the patient's grandmother. The patient's aunt then accompanied the patient and the room throughout the rest of the appointment. We had a lengthy discussion regarding the deleterious effects of uncontrolled hyperglycemia and smoking as it relates to wound healing and overall health. She was strongly encouraged to decrease her smoking and get her diabetes under better control. She states she is currently on a diet and has cut down her [facility] consumption. The left toe is erythematous, macerated and slightly edematous  with malodor present. The edema in her left foot is below her baseline, there is no erythema streaking. We will treat her with Santyl, doxycycline; we have ordered and xray, culture and provided a Peg assist surgical shoe and cultured the wound. Wound History Patient presents with 1 open wound that has been present for approximately 1 month. Patient has been treating wound in the following manner: open to air. Laboratory tests have not been performed in the last month. Patient reportedly has not tested positive for an antibiotic resistant organism. Patient reportedly has not tested positive for osteomyelitis. Patient reportedly has not had testing performed to evaluate circulation in the legs. Patient History Information obtained from Patient. Allergies erythromycin base Family History Cancer - Paternal Grandparents, Heart Disease - Paternal Grandparents, Hypertension - Paternal Grandparents, No family history of Diabetes,  Hereditary Spherocytosis, Kidney Disease, Lung Disease, Seizures, Stroke, Thyroid Problems, Tuberculosis. Social History Current every day smoker, Marital Status - Single, Alcohol Use - Never, Drug Use - Prior History, Caffeine Use - Daily. Medical History Hematologic/Lymphatic Denies history of Anemia, Hemophilia, Human Immunodeficiency Virus, Lymphedema, Sickle Cell Disease [name], [name] ([id number]) Respiratory Patient has history of Chronic Obstructive Pulmonary Disease (COPD) Denies history of Aspiration, Asthma, Pneumothorax, Sleep Apnea, Tuberculosis Cardiovascular Patient has history of Congestive Heart Failure, Hypertension Denies history of Angina, Arrhythmia, Coronary Artery Disease, Deep Vein Thrombosis, Hypotension, Myocardial Infarction, Peripheral Arterial Disease, Peripheral Venous Disease, Phlebitis, Vasculitis Gastrointestinal Denies history of Cirrhosis , Colitis, Crohn s, Hepatitis A, Hepatitis B, Hepatitis C Endocrine Patient has history of Type II Diabetes Genitourinary Denies history of End Stage Renal Disease Immunological Denies history of Lupus Erythematosus, Raynaud s, Scleroderma Musculoskeletal Denies history of Gout, Rheumatoid Arthritis, Osteoarthritis, Osteomyelitis Neurologic Patient has history of Neuropathy, Seizure Disorder Oncologic Denies history of Received Chemotherapy, Received Radiation Patient is treated with Insulin, Oral Agents. Review of Systems (ROS) Constitutional Symptoms (General Health) The patient has no complaints or symptoms. Eyes Complains or has symptoms of Glasses / Contacts. Ear/Nose/Mouth/Throat The patient has no complaints or symptoms. Hematologic/Lymphatic The patient has no complaints or symptoms. Respiratory The patient has no complaints or symptoms. Cardiovascular The patient has no complaints or symptoms. Gastrointestinal The patient has no complaints or symptoms. Genitourinary The patient has no  complaints or symptoms. Immunological The patient has no complaints or symptoms. Integumentary (Skin) The patient has no complaints or symptoms. Musculoskeletal The patient has no complaints or symptoms. Neurologic The patient has no complaints or symptoms. Oncologic The patient has no complaints or symptoms. Psychiatric The patient has no complaints or symptoms. MORGANN, WOODBURN A. ([id number]) Objective Constitutional Vitals Time Taken: 12:54 PM, Height: 69 in, Source: Measured, Weight: 250 lbs, Source: Measured, BMI: 36.9, Temperature: 98.6 F, Pulse: 87 bpm, Respiratory Rate: 16 breaths/min, Blood Pressure: 98/62 mmHg. Respiratory respirations are even and unlabored. clear throughout. Cardiovascular S1 S2 with regular rate and rhythm. LLE- palpable DP, PT. LLE- pedal edema, xerosis,. Musculoskeletal arrived in wheelchair. Psychiatric appears to have poor insight and/or judgement; poor historian. oriented x4. Integumentary (Hair, Skin) Wound #1 status is Open. Original cause of wound was Gradually Appeared. The wound is located on the Left Toe Great. The wound measures 1.9cm length x 5.1cm width x 0.4cm depth; 7.611cm^2 area and 3.044cm^3 volume. There is Fat Layer (Subcutaneous Tissue) Exposed exposed. There is no tunneling or undermining noted. There is a large amount of serous drainage noted. Foul odor after cleansing was noted. The wound margin is flat and intact. There is medium (34-66%) red, pink granulation  within the wound bed. There is a medium (34-66%) amount of necrotic tissue within the wound bed including Eschar and Adherent Slough. The periwound skin appearance exhibited: Erythema. The periwound skin appearance did not exhibit: Callus, Crepitus, Excoriation, Induration, Rash, Scarring, Dry/Scaly, Maceration, Atrophie Blanche, Cyanosis, Ecchymosis, Hemosiderin Staining, Mottled, Pallor, Rubor. The surrounding wound skin color is noted with erythema which  is circumferential. Periwound temperature was noted as No Abnormality. 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 There was a Excisional Skin/Subcutaneous Tissue Debridement with a total area of 9.69 sq cm performed by Lawanda Cousins, NP. With the following instrument(s): Blade, and Forceps. to remove Viable and Non-Viable tissue/material Material removed includes Subcutaneous Tissue, and [name], [name], [name] ([id number]) Skin: Dermis, Skin: Epidermis, Fibrin/Exudate, and Slough after achieving pain control using Lidocaine 4% Topical Solution. 1 specimen was taken by a Swab and sent to the lab per facility protocol. A time out was conducted at 13:13, prior to the start of the procedure. A Minimum amount of bleeding was controlled with Pressure. The procedure was tolerated well with a pain level of 0 throughout and a pain level of 0 following the procedure. Post Debridement Measurements: 1.9cm length x 5.1cm width x 0.5cm depth; 3.805cm^3 volume. Character of Wound/Ulcer Post Debridement requires further debridement. Severity of Tissue Post Debridement is: Fat layer exposed. Post procedure Diagnosis Wound #1: Same as Pre-Procedure Plan Wound Cleansing: Wound #1 Left Toe Great: Clean wound with Normal Saline. Anesthetic (add to Medication List): Wound #1 Left Toe Great: Topical Lidocaine 4% cream applied to wound bed prior to debridement (In Clinic Only). Primary Wound Dressing: Wound #1 Left Toe Great: Santyl Ointment Secondary Dressing: Wound #1 Left Toe Great: Dry  Gauze Conform/Kerlix Dressing Change Frequency: Wound #1 Left Toe Great: Change dressing every day. Follow-up Appointments: Wound #1 Left Toe Great: Return Appointment in 1 week. Edema Control: Wound #1 Left Toe Great: Patient to wear own compression stockings Elevate legs to the level of the heart and pump ankles as often as possible Off-Loading: Wound #1 Left Toe Great: Open toe surgical shoe with peg assist. Additional Orders / Instructions: Wound #1 Left Toe Great: Stop Smoking Increase protein intake. Laboratory ordered were: Wound culture routine Radiology ordered were: X-ray, foot The following medication(s) was prescribed: lidocaine topical 4 % cream 1 1 cream topical was prescribed at facility Santyl topical 250 unit/gram ointment ointment topical; nickel thick amount to ulcer daily starting 01/19/2018 doxycycline monohydrate oral 100 mg tablet tablet oral twice daily for 14 days starting 01/18/2018 Augmentin oral 875 mg-125 mg tablet 1 1 tablet oral taken 2 times a day for 10 days starting 01/22/2018 ATISHA, HAMIDI A. ([id number]) 1. culture 2. doxycycline 3. xray left foot 4. peg assist shoe for offloading 5. santyl to ulcer daily 6. follow up next week Electronic Signature(s) Signed: 01/23/2018 8:40:52 AM By: Worthy Keeler PA-C Signed: 03/23/2018 6:55:59 AM By: Lawanda Cousins Previous Signature: 01/18/2018 3:59:21 PM Version By: Lawanda Cousins Entered By: Worthy Keeler on 01/22/2018 16:44:07 [name], [name] ([id number]) -------------------------------------------------------------------------------- ROS/PFSH Details Patient Name: Heather Dear A. Date of Service: 01/18/2018 12:30 PM Medical Record Number:  [id number] Patient Account Number: [id number] Date of Birth/Sex: 1976/09/13 (41 y.o. F) Treating RN: Montey Hora Primary Care Provider: Suzan Garibaldi Other Clinician: Referring Provider: Suzan Garibaldi Treating Provider/Extender: Cathie Olden in  Treatment: 0 Information Obtained From Patient Wound History Do you currently have one or more open woundso Yes How many open wounds do you currently haveo 1 Approximately how long have you had your woundso 1 month How have you been treating your wound(s) until nowo open to air Has your wound(s) ever healed and then re-openedo No Have you had any lab work done in the past montho No Have you tested positive for an antibiotic resistant organism (MRSA, VRE)o No Have you tested positive for osteomyelitis (bone infection)o No Have you had any tests for circulation on your legso No Eyes Complaints and Symptoms: Positive for: Glasses / Contacts Genitourinary Complaints and Symptoms: No Complaints or Symptoms Complaints and Symptoms: Negative for: Kidney failure/ Dialysis; Incontinence/dribbling Medical History: Negative for: End Stage Renal Disease Constitutional Symptoms (General Health) Complaints and Symptoms: No Complaints or Symptoms Ear/Nose/Mouth/Throat Complaints and Symptoms: No Complaints or Symptoms Hematologic/Lymphatic Complaints and Symptoms: No Complaints or Symptoms Medical History: Negative for: Anemia; Hemophilia; Human Immunodeficiency Virus; Lymphedema; Sickle Cell Disease Respiratory [name], [name] ([id number]) Complaints and Symptoms: No Complaints or Symptoms Medical History: Positive for: Chronic Obstructive Pulmonary Disease (COPD) Negative for: Aspiration; Asthma; Pneumothorax; Sleep Apnea; Tuberculosis Cardiovascular Complaints and Symptoms: No Complaints or Symptoms Medical History: Positive for: Congestive Heart Failure; Hypertension Negative for: Angina; Arrhythmia; Coronary Artery Disease; Deep Vein Thrombosis; Hypotension; Myocardial Infarction; Peripheral Arterial Disease; Peripheral Venous Disease; Phlebitis; Vasculitis Gastrointestinal Complaints and Symptoms: No Complaints or Symptoms Medical History: Negative for: Cirrhosis ;  Colitis; Crohnos; Hepatitis A; Hepatitis B; Hepatitis C Endocrine Medical History: Positive for: Type II Diabetes Treated with: Insulin, Oral agents Immunological Complaints and Symptoms: No Complaints or Symptoms Medical History: Negative for: Lupus Erythematosus; Raynaudos; Scleroderma Integumentary (Skin) Complaints and Symptoms: No Complaints or Symptoms Musculoskeletal Complaints and Symptoms: No Complaints or Symptoms Medical History: Negative for: Gout; Rheumatoid Arthritis; Osteoarthritis; Osteomyelitis Neurologic Complaints and Symptoms: No Complaints or Symptoms Medical History: [name], MURFIN A. ([id number]) Positive for: Neuropathy; Seizure Disorder Oncologic Complaints and Symptoms: No Complaints or Symptoms Medical History: Negative for: Received Chemotherapy; Received Radiation Psychiatric Complaints and Symptoms: No Complaints or Symptoms Immunizations Pneumococcal Vaccine: Received Pneumococcal Vaccination: Yes Immunization Notes: up to date Implantable Devices Family and Social History Cancer: Yes - Paternal Grandparents; Diabetes: No; Heart Disease: Yes - Paternal Grandparents; Hereditary Spherocytosis: No; Hypertension: Yes - Paternal Grandparents; Kidney Disease: No; Lung Disease: No; Seizures: No; Stroke: No; Thyroid Problems: No; Tuberculosis: No; Current every day smoker; Marital Status - Single; Alcohol Use: Never; Drug Use: Prior History; Caffeine Use: Daily; Financial Concerns: No; Food, Clothing or Shelter Needs: No; Support System Lacking: No; Transportation Concerns: No; Advanced Directives: No; Patient does not want information on Advanced Directives Electronic Signature(s) Signed: 01/18/2018 4:03:23 PM By: Montey Hora Signed: 01/18/2018 9:02:48 PM By: Lawanda Cousins Entered By: Montey Hora on 01/18/2018 12:54:05 [name] ([id number]) -------------------------------------------------------------------------------- SuperBill  Details Patient Name: Heather Dear A. Date of Service: 01/18/2018 Medical Record Number: [id number] Patient Account Number: [id number] Date of Birth/Sex: 1976/03/27 (42 y.o. 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 L03.032 Cellulitis of left toe Facility Procedures CPT4 Code Description: [id number] - WOUND CARE VISIT-LEV 3 EST PT Modifier: Quantity: 1 CPT4 Code Description: [id number] - DEB SUBQ TISSUE 20 SQ CM/< ICD-10 Diagnosis Description 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 with unsp Modifier: ecified severity Quantity: 1 Physician Procedures CPT4 Code Description: [id number] [place] PHYS LEVEL 3 o NEW PT ICD-10 Diagnosis Description E11.621 Type 2 diabetes mellitus with foot ulcer 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

## 2018-01-22 LAB — AEROBIC CULTURE W GRAM STAIN (SUPERFICIAL SPECIMEN)

## 2018-01-25 ENCOUNTER — Encounter: Payer: Medicaid Other | Attending: Nurse Practitioner | Admitting: Nurse Practitioner

## 2018-01-25 DIAGNOSIS — E11621 Type 2 diabetes mellitus with foot ulcer: Secondary | ICD-10-CM | POA: Insufficient documentation

## 2018-01-25 DIAGNOSIS — J449 Chronic obstructive pulmonary disease, unspecified: Secondary | ICD-10-CM | POA: Insufficient documentation

## 2018-01-25 DIAGNOSIS — F17210 Nicotine dependence, cigarettes, uncomplicated: Secondary | ICD-10-CM | POA: Diagnosis not present

## 2018-01-25 DIAGNOSIS — E11622 Type 2 diabetes mellitus with other skin ulcer: Secondary | ICD-10-CM | POA: Diagnosis not present

## 2018-01-25 DIAGNOSIS — L03032 Cellulitis of left toe: Secondary | ICD-10-CM | POA: Diagnosis not present

## 2018-01-25 DIAGNOSIS — E1151 Type 2 diabetes mellitus with diabetic peripheral angiopathy without gangrene: Secondary | ICD-10-CM | POA: Insufficient documentation

## 2018-01-25 DIAGNOSIS — I509 Heart failure, unspecified: Secondary | ICD-10-CM | POA: Insufficient documentation

## 2018-01-25 DIAGNOSIS — Z794 Long term (current) use of insulin: Secondary | ICD-10-CM | POA: Diagnosis not present

## 2018-01-25 DIAGNOSIS — G40909 Epilepsy, unspecified, not intractable, without status epilepticus: Secondary | ICD-10-CM | POA: Insufficient documentation

## 2018-01-25 DIAGNOSIS — I110 Hypertensive heart disease with heart failure: Secondary | ICD-10-CM | POA: Insufficient documentation

## 2018-01-25 DIAGNOSIS — Z881 Allergy status to other antibiotic agents status: Secondary | ICD-10-CM | POA: Diagnosis not present

## 2018-01-25 DIAGNOSIS — L97529 Non-pressure chronic ulcer of other part of left foot with unspecified severity: Secondary | ICD-10-CM | POA: Diagnosis not present

## 2018-01-26 ENCOUNTER — Other Ambulatory Visit
Admission: RE | Admit: 2018-01-26 | Discharge: 2018-01-26 | Disposition: A | Discharge status: Home / Self Care | Payer: Medicaid Other | Source: Ambulatory Visit | Attending: Nurse Practitioner | Admitting: Nurse Practitioner

## 2018-01-26 DIAGNOSIS — S81802A Unspecified open wound, left lower leg, initial encounter: Secondary | ICD-10-CM | POA: Diagnosis present

## 2018-01-26 DIAGNOSIS — X58XXXA Exposure to other specified factors, initial encounter: Secondary | ICD-10-CM | POA: Insufficient documentation

## 2018-01-26 NOTE — Progress Notes (Addendum)
Heather, [name] ([id number]) Visit Report for 01/25/2018 Chief Complaint Document Details Patient Name: [name], [name] Date of Service: 01/25/2018 2:30 PM Medical Record Number: [id number] Patient Account Number: [id number] Date of Birth/Sex: 1976/07/02 (42 y.o. F) Treating RN: Ahmed Prima Primary Care Provider: Suzan Garibaldi Other Clinician: Referring Provider: Suzan Garibaldi Treating Provider/Extender: Cathie Olden in Treatment: 1 Information Obtained from: Patient Chief Complaint She is here for evaluation of a left great toe ulcer Electronic Signature(s) Signed: 01/25/2018 3:47:07 PM By: Lawanda Cousins Entered By: Lawanda Cousins on 01/25/2018 15:47:07 Jutras, Salote A. ([id number]) -------------------------------------------------------------------------------- Debridement Details Patient Name: Heather Dear A. Date of Service: 01/25/2018 2:30 PM Medical Record Number: [id number] Patient Account Number: [id number] Date of Birth/Sex: 1975-11-19 (42 y.o. F) Treating RN: Ahmed Prima Primary Care Provider: Suzan Garibaldi Other Clinician: Referring Provider: Suzan Garibaldi Treating Provider/Extender: Cathie Olden in Treatment: 1 Debridement Performed for Wound #1 Left Toe Great Assessment: Performed By: Physician Lawanda Cousins, NP Debridement Type: Debridement Severity of Tissue Pre Fat layer exposed Debridement: Pre-procedure Verification/Time Yes - 14:57 Out Taken: Start Time: 14:58 Pain Control: Lidocaine 4% Topical Solution Total Area Debrided (L x W): 2.1 (cm) x 1.8 (cm) = 3.78 (cm) Tissue and other material Viable, Non-Viable, Slough, Subcutaneous, Fibrin/Exudate, Slough debrided: Level: Skin/Subcutaneous Tissue Debridement Description: Excisional Instrument: Curette Bleeding: Minimum Hemostasis Achieved: Pressure End Time: 15:00 Procedural Pain: 0 Post Procedural Pain: 0 Response to Treatment: Procedure was tolerated well Post Debridement  Measurements of Total Wound Length: (cm) 2.1 Width: (cm) 1.8 Depth: (cm) 0.3 Volume: (cm) 0.891 Character of Wound/Ulcer Post Debridement: Requires Further Debridement Severity of Tissue Post Debridement: Fat layer exposed Post Procedure Diagnosis Same as Pre-procedure Electronic Signature(s) Signed: 01/25/2018 3:46:58 PM By: Lawanda Cousins Signed: 01/25/2018 4:32:30 PM By: Alric Quan Entered By: Lawanda Cousins on 01/25/2018 15:46:58 Sherrill, Lissa A. ([id number]) -------------------------------------------------------------------------------- HPI Details Patient Name: Heather Dear A. Date of Service: 01/25/2018 2:30 PM Medical Record Number: [id number] Patient Account Number: [id number] Date of Birth/Sex: August 29, 1976 (42 y.o. F) Treating RN: Ahmed Prima Primary Care Provider: Suzan Garibaldi Other Clinician: Referring Provider: Suzan Garibaldi Treating Provider/Extender: Cathie Olden in Treatment: 1 History of Present Illness HPI Description: 01/18/18-She is here for initial evaluation of the left great toe ulcer. She is a poor historian in regards to timeframe in detail. She states approximately 4 weeks ago she lacerated her toe on something in the house. She followed up with her primary care who placed her on Bactrim and ultimately a second dose of Bactrim prior to coming to wound clinic. She states she has been treating the toe with peroxide, Betadine and a Band-Aid. She did not check her blood sugar this morning but checked it yesterday morning it was 327; she is unaware of a recent A1c and there are no current records. She saw Dr. she would've orthopedics last week for an old injury to the left ankle, she states he did not see her toe, nor did she bring it to his attention. She smokes approximately 1 pack cigarettes a day. Her social situation is concerning, she arrives this morning with her mother who appears extremely intoxicated/under the influence; her mother was asked  to leave the room and be monitored by the patient's grandmother. The patient's aunt then accompanied the patient and the room throughout the rest of the appointment. We had a lengthy discussion regarding the deleterious effects of uncontrolled hyperglycemia and smoking as it relates to wound healing and overall health. She was strongly encouraged to decrease her  smoking and get her diabetes under better control. She states she is currently on a diet and has cut down her [facility] consumption. The left toe is erythematous, macerated and slightly edematous with malodor present. The edema in her left foot is below her baseline, there is no erythema streaking. We will treat her with Santyl, doxycycline; we have ordered and xray, culture and provided a Peg assist surgical shoe and cultured the wound. 01/25/18-She is here in follow-up evaluation for a left great toe ulcer and presents with an abscess to her suprapubic area. She states her blood sugars remain elevated, feeling "sick" and if levels are below 250, but she is trying. She has made no attempt to decrease her smoking stating that we "can't take away her food in her cigarettes". She has been compliant with offloading using the PEG assist you. She is using Santyl daily. the culture obtained last week grew staph aureus and Enterococcus faecalis; continues on the doxycycline and Augmentin was added on Monday. The suprapubic area has erythema, no femoral variation, purple discoloration, minimal induration, was accessed with a cotton tip applicator with sanguinopurulent drainage, this was cultured, I suspect the current antibiotic treatment will cover and we will not add anything to her current treatment plan. She was advised to go to urgent care or ER with any change in redness, induration or fever. Electronic Signature(s) Signed: 01/25/2018 3:51:55 PM By: Lawanda Cousins Previous Signature: 01/25/2018 3:48:32 PM Version By: Lawanda Cousins Entered By:  Lawanda Cousins on 01/25/2018 15:51:55 [name] ([id number]) -------------------------------------------------------------------------------- Physician Orders Details Patient Name: Heather Dear A. Date of Service: 01/25/2018 2:30 PM Medical Record Number: [id number] Patient Account Number: [id number] Date of Birth/Sex: 12-Jul-1976 (42 y.o. F) Treating RN: Ahmed Prima Primary Care Provider: Suzan Garibaldi Other Clinician: Referring Provider: Suzan Garibaldi Treating Provider/Extender: Cathie Olden in Treatment: 1 Verbal / Phone Orders: Yes Clinician: Pinkerton, Debi Read Back and Verified: Yes Diagnosis Coding Wound Cleansing Wound #1 Left Toe Great o Clean wound with Normal Saline. o Cleanse wound with mild soap and water Wound #2 Midline Abdomen - Lower Quadrant o Clean wound with Normal Saline. o Cleanse wound with mild soap and water Anesthetic (add to Medication List) Wound #1 Left Toe Great o Topical Lidocaine 4% cream applied to wound bed prior to debridement (In Clinic Only). Wound #2 Midline Abdomen - Lower Quadrant o Topical Lidocaine 4% cream applied to wound bed prior to debridement (In Clinic Only). Skin Barriers/Peri-Wound Care Wound #2 Midline Abdomen - Lower Quadrant o Skin Prep Primary Wound Dressing Wound #1 Left Toe Great o Santyl Ointment Wound #2 Midline Abdomen - Lower Quadrant o Iodoform packing Gauze - 1/4 " Secondary Dressing Wound #1 Left Toe Great o Dry Gauze o Conform/Kerlix Wound #2 Midline Abdomen - [place] Dressing Change Frequency Wound #1 Left Toe Great o Change dressing every day. Wound #2 Midline Abdomen - Lower Quadrant o Change dressing every day. LIDDIE, CHICHESTER ([id number]) Follow-up Appointments Wound #1 Left Toe Great o Return Appointment in 1 week. Wound #2 Midline Abdomen - Lower Quadrant o Return Appointment in 1 week. Edema Control Wound #1 Left Toe  Great o Patient to wear own compression stockings o Elevate legs to the level of the heart and pump ankles as often as possible Off-Loading Wound #1 Left Toe Great o Open toe surgical shoe with peg assist. Additional Orders / Instructions Wound #1 Left Toe Great o Stop Smoking o Increase protein intake. Wound #2 Midline  Abdomen - Lower Quadrant o Stop Smoking o Increase protein intake. Medications-please add to medication list. Wound #1 Left Toe Great o P.O. Antibiotics - continue to take antibiotics as prescribed Laboratory o Bacteria identified in Wound by Culture (MICRO) - midline lower abdomen oooo LOINC Code: 5573-2 oooo Convenience Name: Wound culture routine Patient Medications Allergies: erythromycin base Notifications Medication Indication Start End lidocaine DOSE 1 - topical 4 % cream - 1 cream topical Electronic Signature(s) Signed: 01/25/2018 4:32:30 PM By: Alric Quan Signed: 01/25/2018 4:47:35 PM By: Lawanda Cousins Entered By: Alric Quan on 01/25/2018 16:18:31 [name] ([id number]) -------------------------------------------------------------------------------- Prescription 01/25/2018 Patient Name: Heather Dear A. Provider: Lawanda Cousins NP Date of Birth: [birth year] NPI#: [id number] Sex: F DEA#: [id number] Phone #: [phone] License #: Patient Address: [place], [place] [address] [address], [place], [place] [address] [phone] Allergies erythromycin base Medication Medication: Route: Strength: Form: lidocaine 4 % topical cream topical 4% cream Class: TOPICAL LOCAL ANESTHETICS Dose: Frequency / Time: Indication: 1 1 cream topical Number of Refills: Number of Units: 0 Generic Substitution: Start Date: End Date: One Time Use: Substitution Permitted No Note to Pharmacy: Signature(s): Date(s): Electronic  Signature(s) Signed: 01/25/2018 4:32:30 PM By: Alric Quan Signed: 01/25/2018 4:47:35 PM By: Lawanda Cousins Entered By: Alric Quan on 01/25/2018 16:18:31 Matich, Camisha A. ([id number]) --------------------------------------------------------------------------------  Problem List Details Patient Name: Heather Dear A. Date of Service: 01/25/2018 2:30 PM Medical Record Number: [id number] Patient Account Number: [id number] Date of Birth/Sex: 1976-03-28 (42 y.o. 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 ([id number]) -------------------------------------------------------------------------------- Progress Note Details Patient Name: Heather Dear A. Date of Service: 01/25/2018 2:30 PM Medical Record Number: [id number] Patient Account Number: [id number] Date of Birth/Sex: 1976-01-04 (42 y.o. F) Treating RN: Ahmed Prima Primary Care Provider: Suzan Garibaldi Other Clinician: Referring Provider: Suzan Garibaldi Treating Provider/Extender: Cathie Olden in Treatment: 1 Subjective Chief Complaint Information obtained from Patient She is here for evaluation of a left great toe ulcer History of Present Illness (HPI) 01/18/18-She is here for initial evaluation of the left great toe ulcer. She is a poor historian  in regards to timeframe in detail. She states approximately 4 weeks ago she lacerated her toe on something in the house. She followed up with her primary care who placed her on Bactrim and ultimately a second dose of Bactrim prior to coming to wound clinic. She states she has been treating the toe with peroxide, Betadine and a Band-Aid. She did not check her blood sugar this morning but checked it yesterday morning it was 327; she is unaware of a recent A1c and there are no current records. She saw Dr. she would've orthopedics last week for an old injury to the left ankle, she states he did not see her toe, nor did she bring it to his attention. She smokes approximately 1 pack cigarettes a day. Her social situation is concerning, she  arrives this morning with her mother who appears extremely intoxicated/under the influence; her mother was asked to leave the room and be monitored by the patient's grandmother. The patient's aunt then accompanied the patient and the room throughout the rest of the appointment. We had a lengthy discussion regarding the deleterious effects of uncontrolled hyperglycemia and smoking as it relates to wound healing and overall health. She was strongly encouraged to decrease her smoking and get her diabetes under better control. She states she is currently on a diet and has cut down her [facility] consumption. The left toe is erythematous, macerated and slightly edematous with malodor present. The edema in her left foot is below her baseline, there is no erythema streaking. We will treat her with Santyl, doxycycline; we have ordered and xray, culture and provided a Peg assist surgical shoe and cultured the wound. 01/25/18-She is here in follow-up evaluation for a left great toe ulcer and presents with an abscess to her suprapubic area. She states her blood sugars remain elevated, feeling "sick" and if levels are below 250, but she is trying. She has made no attempt to  decrease her smoking stating that we "can't take away her food in her cigarettes". She has been compliant with offloading using the PEG assist you. She is using Santyl daily. the culture obtained last week grew staph aureus and Enterococcus faecalis; continues on the doxycycline and Augmentin was added on Monday. The suprapubic area has erythema, no femoral variation, purple discoloration, minimal induration, was accessed with a cotton tip applicator with sanguinopurulent drainage, this was cultured, I suspect the current antibiotic treatment will cover and we will not add anything to her current treatment plan. She was advised to go to urgent care or ER with any change in redness, induration or fever. Patient History Information obtained from Patient. Family History Cancer - Paternal Grandparents, Heart Disease - Paternal Grandparents, Hypertension - Paternal Grandparents, No family history of Diabetes, Hereditary Spherocytosis, Kidney Disease, Lung Disease, Seizures, Stroke, Thyroid Problems, Tuberculosis. Social History Current every day smoker, Marital Status - Single, Alcohol Use - Never, Drug Use - Prior History, Caffeine Use - Daily. JERLYN, PAIN A. ([id number]) Objective Constitutional Vitals Time Taken: 2:29 PM, Height: 69 in, Weight: 250 lbs, BMI: 36.9, Temperature: 98.7 F, Pulse: 92 bpm, Respiratory Rate: 16 breaths/min, Blood Pressure: 118/89 mmHg. Integumentary (Hair, Skin) Wound #1 status is Open. Original cause of wound was Gradually Appeared. The wound is located on the Left Toe Great. The wound measures 2.1cm length x 1.8cm width x 0.2cm depth; 2.969cm^2 area and 0.594cm^3 volume. There is Fat Layer (Subcutaneous Tissue) Exposed exposed. There is no tunneling or undermining noted. There is a large amount of serous drainage noted. Foul odor after cleansing was noted. The wound margin is flat and intact. There is large (67-100%) red, pink granulation within the wound bed.  There is a small (1-33%) amount of necrotic tissue within the wound bed including Eschar and Adherent Slough. The periwound skin appearance exhibited: Erythema. The periwound skin appearance did not exhibit: Callus, Crepitus, Excoriation, Induration, Rash, Scarring, Dry/Scaly, Maceration, Atrophie Blanche, Cyanosis, Ecchymosis, Hemosiderin Staining, Mottled, Pallor, Rubor. The surrounding wound skin color is noted with erythema which is circumferential. Periwound temperature was noted as No Abnormality. Wound #2 status is Open. Original cause of wound was Pimple. The wound is located on the Midline Abdomen - Lower Quadrant. The wound measures 0.4cm length x 0.6cm width x 0.1cm depth; 0.188cm^2 area and 0.019cm^3 volume. There is no tunneling or  undermining noted. There is a large amount of sanguinous drainage noted. The wound margin is flat and intact. There is no granulation within the wound bed. There is a large (67-100%) amount of necrotic tissue within the wound bed including Eschar and Adherent Slough. The periwound skin appearance exhibited: Ecchymosis, Erythema. The periwound skin appearance did not exhibit: Callus, Crepitus, Excoriation, Induration, Rash, Scarring, Dry/Scaly, Maceration, Atrophie Blanche, Cyanosis, Hemosiderin Staining, Mottled, Pallor, Rubor. The surrounding wound skin color is noted with erythema which is circumferential. Periwound temperature was noted as No Abnormality. The periwound has tenderness on palpation. 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 There was a  Excisional Skin/Subcutaneous Tissue Debridement with a total area of 3.78 sq cm performed by Lawanda Cousins, NP. With the following instrument(s): Curette. to remove Viable and Non-Viable tissue/material Material removed includes Subcutaneous Tissue, and Slough, Fibrin/Exudate, and [place] after achieving pain control using Lidocaine 4% Topical Solution. No specimens were taken. A time out was conducted at 14:57, prior to the start of the procedure. A Minimum amount of bleeding was controlled with Pressure. The Kaminski, Lizbet A. ([id number]) procedure was tolerated well with a pain level of 0 throughout and a pain level of 0 following the procedure. Post Debridement Measurements: 2.1cm length x 1.8cm width x 0.3cm depth; 0.891cm^3 volume. Character of Wound/Ulcer Post Debridement requires further debridement. Severity of Tissue Post Debridement is: Fat layer exposed. Post procedure Diagnosis Wound #1: Same as Pre-Procedure Plan Wound Cleansing: Wound #1 Left Toe Great: Clean wound with Normal Saline. Cleanse wound with mild soap and water Wound #2 Midline Abdomen - Lower Quadrant: Clean wound with Normal Saline. Cleanse wound with mild soap and water Anesthetic (add to Medication List): Wound #1 Left Toe Great: Topical Lidocaine 4% cream applied to wound bed prior to debridement (In Clinic Only). Wound #2 Midline Abdomen - Lower Quadrant: Topical Lidocaine 4% cream applied to wound bed prior to debridement (In Clinic Only). Skin Barriers/Peri-Wound Care: Wound #2 Midline Abdomen - Lower Quadrant: Skin Prep Primary Wound Dressing: Wound #1 Left Toe Great: Santyl Ointment Wound #2 Midline Abdomen - Lower Quadrant: Iodoform packing Gauze - 1/4 " Secondary Dressing: Wound #1 Left Toe Great: Dry Gauze Conform/Kerlix Wound #2 Midline Abdomen - Lower Quadrant: [place] Dressing Change Frequency: Wound #1 Left Toe Great: Change dressing every day. Wound #2 Midline Abdomen - Lower  Quadrant: Change dressing every day. Follow-up Appointments: Wound #1 Left Toe Great: Return Appointment in 1 week. Wound #2 Midline Abdomen - Lower Quadrant: Return Appointment in 1 week. Edema Control: Wound #1 Left Toe Great: Patient to wear own compression stockings Elevate legs to the level of the heart and pump ankles as often as possible Off-Loading: Wound #1 Left Toe Great: Open toe surgical shoe with peg assist. Additional Orders / Instructions: Wound #1 Left Toe Great: Stop Smoking Leamy, Tremaine A. ([id number]) Increase protein intake. Wound #2 Midline Abdomen - Lower Quadrant: Stop Smoking Increase protein intake. Medications-please add to medication list.: Wound #1 Left Toe Great: P.O. Antibiotics - continue to take antibiotics as prescribed Laboratory ordered were: Wound culture routine - midline lower abdomen The following medication(s)  was prescribed: lidocaine topical 4 % cream 1 1 cream topical was prescribed at facility 1. 1/4" iodoform packing to suprapubic abscess 2. santyl daily to toe 3. continue to offload 4. tight glycemic control 5. smoking cessation 6. follow up next week Electronic Signature(s) Signed: 01/25/2018 4:49:23 PM By: Lawanda Cousins Previous Signature: 01/25/2018 4:17:22 PM Version By: Lawanda Cousins Entered By: Lawanda Cousins on 01/25/2018 16:49:23 [name] ([id number]) -------------------------------------------------------------------------------- ROS/PFSH Details Patient Name: Heather Dear A. Date of Service: 01/25/2018 2:30 PM Medical Record Number: [id number] Patient Account Number: [id number] Date of Birth/Sex: 1976/09/12 (42 y.o. F) Treating RN: Ahmed Prima Primary Care Provider: Suzan Garibaldi Other Clinician: Referring Provider: Suzan Garibaldi Treating Provider/Extender: Cathie Olden in Treatment: 1 Information Obtained From Patient Wound History Do you currently have one or more open woundso Yes How many  open wounds do you currently haveo 1 Approximately how long have you had your woundso 1 month How have you been treating your wound(s) until nowo open to air Has your wound(s) ever healed and then re-openedo No Have you had any lab work done in the past montho No Have you tested positive for an antibiotic resistant organism (MRSA, VRE)o No Have you tested positive for osteomyelitis (bone infection)o No Have you had any tests for circulation on your legso No Hematologic/Lymphatic Medical History: Negative for: Anemia; Hemophilia; Human Immunodeficiency Virus; Lymphedema; Sickle Cell Disease Respiratory Medical History: Positive for: Chronic Obstructive Pulmonary Disease (COPD) Negative for: Aspiration; Asthma; Pneumothorax; Sleep Apnea; Tuberculosis Cardiovascular Medical History: Positive for: Congestive Heart Failure; Hypertension Negative for: Angina; Arrhythmia; Coronary Artery Disease; Deep Vein Thrombosis; Hypotension; Myocardial Infarction; Peripheral Arterial Disease; Peripheral Venous Disease; Phlebitis; Vasculitis Gastrointestinal Medical History: Negative for: Cirrhosis ; Colitis; Crohnos; Hepatitis A; Hepatitis B; Hepatitis C Endocrine Medical History: Positive for: Type II Diabetes Treated with: Insulin, Oral agents Genitourinary Medical History: Negative for: End Stage Renal Disease Immunological ALESE, FURNISS A. ([id number]) Medical History: Negative for: Lupus Erythematosus; Raynaudos; Scleroderma Musculoskeletal Medical History: Negative for: Gout; Rheumatoid Arthritis; Osteoarthritis; Osteomyelitis Neurologic Medical History: Positive for: Neuropathy; Seizure Disorder Oncologic Medical History: Negative for: Received Chemotherapy; Received Radiation Immunizations Pneumococcal Vaccine: Received Pneumococcal Vaccination: Yes Immunization Notes: up to date Implantable Devices Family and Social History Cancer: Yes - Paternal Grandparents; Diabetes: No;  Heart Disease: Yes - Paternal Grandparents; Hereditary Spherocytosis: No; Hypertension: Yes - Paternal Grandparents; Kidney Disease: No; Lung Disease: No; Seizures: No; Stroke: No; Thyroid Problems: No; Tuberculosis: No; Current every day smoker; Marital Status - Single; Alcohol Use: Never; Drug Use: Prior History; Caffeine Use: Daily; Financial Concerns: No; Food, Clothing or Shelter Needs: No; Support System Lacking: No; Transportation Concerns: No; Advanced Directives: No; Patient does not want information on Advanced Directives Physician Affirmation I have reviewed and agree with the above information. Electronic Signature(s) Signed: 01/25/2018 4:32:30 PM By: Alric Quan Signed: 01/25/2018 4:47:35 PM By: Lawanda Cousins Entered By: Lawanda Cousins on 01/25/2018 15:52:17 [name] ([id number]) -------------------------------------------------------------------------------- [place] Details Patient Name: Heather Dear A. Date of Service: 01/25/2018 Medical Record Number: [id number] Patient Account Number: [id number] Date of Birth/Sex: December 12, 1975 (42 y.o. 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 with unsp E11.621 Type 2 diabetes mellitus with foot ulcer L03.032 Cellulitis of left toe  L02.211 Cutaneous abscess of abdominal wall Modifier: ecified severity Quantity: 1 Physician Procedures CPT4 Code Description:  [id number] - WC PHYS LEVEL 3 - EST PT ICD-10 Diagnosis Description L02.211 Cutaneous abscess of abdominal wall Modifier: Quantity: 1 CPT4 Code Description: 4514604 11042 - WC PHYS SUBQ TISS 20 SQ CM ICD-10 Diagnosis Description 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

## 2018-01-27 NOTE — Progress Notes (Signed)
[name] ([id number]) Visit Report for 01/25/2018 Arrival Information Details Patient Name: [name], [name] Date of Service: 01/25/2018 2:30 PM Medical Record Number: [id number] Patient Account Number: [id number] Date of Birth/Sex: [birth year] (42 y.o. F) Treating RN: Montey Hora Primary Care Thor Nannini: Suzan Garibaldi Other Clinician: Referring Marlia Schewe: Suzan Garibaldi Treating Jill Stopka/Extender: Cathie Olden in Treatment: 1 Visit Information History Since Last Visit Added or deleted any medications: No Patient Arrived: Wheel Chair Any new allergies or adverse reactions: No Arrival Time: 14:27 Had a fall or experienced change in No Accompanied By: mother activities of daily living that may affect Transfer Assistance: None risk of falls: Patient Identification Verified: Yes Signs or symptoms of abuse/neglect since last visito No Secondary Verification Process Completed: Yes Hospitalized since last visit: No Patient Has Alerts: Yes Implantable device outside of the clinic excluding No Patient Alerts: DMII cellular tissue based products placed in the center since last visit: Has Dressing in Place as Prescribed: Yes Pain Present Now: No Electronic Signature(s) Signed: 01/25/2018 4:24:22 PM By: Montey Hora Entered By: Montey Hora on 01/25/2018 14:28:05 [name] ([id number]) -------------------------------------------------------------------------------- Encounter Discharge Information Details Patient Name: Heather Dear A. Date of Service: 01/25/2018 2:30 PM Medical Record Number: [id number] Patient Account Number: [id number] Date of Birth/Sex: 23-Mar-1976 (41 y.o. F) Treating RN: Ahmed Prima Primary Care Jimmy Stipes: Suzan Garibaldi Other Clinician: Referring Alcide Memoli: Suzan Garibaldi Treating Tamberly Pomplun/Extender: Cathie Olden in Treatment: 1 Encounter Discharge Information Items Discharge Pain Level: 0 Discharge Condition: Stable Ambulatory Status:  Wheelchair Discharge Destination: Home Transportation: Private Auto Schedule Follow-up Appointment: Yes Medication Reconciliation completed and No provided to Patient/Care Devere Brem: Provided on Clinical Summary of Care: 01/25/2018 Form Type Recipient Paper Patient [place] Electronic Signature(s) Signed: 01/26/2018 10:00:44 AM By: Roger Shelter Entered By: Roger Shelter on 01/25/2018 15:19:51 Kingsley, Coretta A. ([id number]) -------------------------------------------------------------------------------- Lower Extremity Assessment Details Patient Name: Heather Dear A. Date of Service: 01/25/2018 2:30 PM Medical Record Number: [id number] Patient Account Number: [id number] Date of Birth/Sex: June 15, 1976 (43 y.o. F) Treating RN: Montey Hora Primary Care Tammie Ellsworth: Suzan Garibaldi Other Clinician: Referring Ayesha Markwell: Suzan Garibaldi Treating Anisia Leija/Extender: Cathie Olden in Treatment: 1 Vascular Assessment Pulses: Dorsalis Pedis Palpable: [Left:Yes] Posterior Tibial Palpable: [Left:Yes] Extremity colors, hair growth, and conditions: Extremity Color: [Left:Normal] Hair Growth on Extremity: [Left:Yes] Temperature of Extremity: [Left:Warm] Capillary Refill: [Left:< 3 seconds] Electronic Signature(s) Signed: 01/25/2018 4:24:22 PM By: Montey Hora Entered By: Montey Hora on 01/25/2018 14:37:35 [place]claw, Saraphina A. ([id number]) -------------------------------------------------------------------------------- Multi Wound Chart Details Patient Name: Heather Dear A. Date of Service: 01/25/2018 2:30 PM Medical Record Number: [id number] Patient Account Number: [id number] Date of Birth/Sex: 01-09-1976 (42 y.o. F) Treating RN: Ahmed Prima Primary Care Dyshawn Cangelosi: Suzan Garibaldi Other Clinician: Referring Braelin Brosch: Suzan Garibaldi Treating Calil Amor/Extender: Cathie Olden in Treatment: 1 Vital Signs Height(in): 43 Pulse(bpm): 61 Weight(lbs): 250 Blood Pressure(mmHg): 118/89 Body  Mass Index(BMI): 37 Temperature(F): 98.7 Respiratory Rate 16 (breaths/min): Photos: [N/A:N/A] Wound Location: Left Toe Great Abdomen - Lower Quadrant - N/A Midline Wounding Event: Gradually Appeared Pimple N/A Primary Etiology: Diabetic Wound/Ulcer of the Cyst N/A Lower Extremity Comorbid History: Chronic Obstructive Chronic Obstructive N/A Pulmonary Disease (COPD), Pulmonary Disease (COPD), Congestive Heart Failure, Congestive Heart Failure, Hypertension, Type II Hypertension, Type II Diabetes, Neuropathy, Seizure Diabetes, Neuropathy, Seizure Disorder Disorder Date Acquired: 01/01/2018 01/19/2018 N/A Weeks of Treatment: 1 0 N/A Wound Status: Open Open N/A Pending Amputation on Yes No N/A Presentation: Measurements L x W x D 2.1x1.8x0.2 0.4x0.6x0.1 N/A (cm) Area (cm) : 2.969 0.188 N/A Volume (cm) :  0.594 0.019 N/A % Reduction in Area: 61.00% N/A N/A % Reduction in Volume: 80.50% N/A N/A Classification: Grade 2 Partial Thickness N/A Exudate Amount: Large Large N/A Exudate Type: Serous Sanguinous N/A Exudate Color: amber red N/A Foul Odor After Cleansing: Yes No N/A Odor Anticipated Due to No N/A N/A Product Use: Almas, Bryana A. ([id number]) Wound Margin: Flat and Intact Flat and Intact N/A Granulation Amount: Large (67-100%) None Present (0%) N/A Granulation Quality: Red, Pink N/A N/A Necrotic Amount: Small (1-33%) Large (67-100%) N/A Necrotic Tissue: Eschar, Adherent Slough Eschar, Adherent Slough N/A Exposed Structures: Fat Layer (Subcutaneous Fascia: No N/A Tissue) Exposed: Yes Fat Layer (Subcutaneous Fascia: No Tissue) Exposed: No Tendon: No Tendon: No Muscle: No Muscle: No Joint: No Joint: No Bone: No Bone: No Epithelialization: None None N/A Debridement: Debridement - Excisional N/A N/A Pre-procedure 14:57 N/A N/A Verification/Time Out Taken: Pain Control: Lidocaine 4% Topical Solution N/A N/A Tissue Debrided: Subcutaneous, Slough N/A N/A Level:  Skin/Subcutaneous Tissue N/A N/A Debridement Area (sq cm): 3.78 N/A N/A Instrument: Curette N/A N/A Bleeding: Minimum N/A N/A Hemostasis Achieved: Pressure N/A N/A Procedural Pain: 0 N/A N/A Post Procedural Pain: 0 N/A N/A Debridement Treatment Procedure was tolerated well N/A N/A Response: Post Debridement 2.1x1.8x0.3 N/A N/A Measurements L x W x D (cm) Post Debridement Volume: 0.891 N/A N/A (cm) Periwound Skin Texture: Excoriation: No Excoriation: No N/A Induration: No Induration: No Callus: No Callus: No Crepitus: No Crepitus: No Rash: No Rash: No Scarring: No Scarring: No Periwound Skin Moisture: Maceration: No Maceration: No N/A Dry/Scaly: No Dry/Scaly: No Periwound Skin Color: Erythema: Yes Ecchymosis: Yes N/A Atrophie Blanche: No Erythema: Yes Cyanosis: No Atrophie Blanche: No Ecchymosis: No Cyanosis: No Hemosiderin Staining: No Hemosiderin Staining: No Mottled: No Mottled: No Pallor: No Pallor: No Rubor: No Rubor: No Erythema Location: Circumferential Circumferential N/A Temperature: No Abnormality No Abnormality N/A Tenderness on Palpation: No Yes N/A Wound Preparation: Ulcer Cleansing: Ulcer Cleansing: N/A Rinsed/Irrigated with Saline Rinsed/Irrigated with Saline Topical Anesthetic Applied: Topical Anesthetic Applied: Other: lidocaine 4% Other: lidocaine 4% Procedures Performed: Debridement N/A N/A Wattenbarger, Merla A. ([id number]) Treatment Notes Wound #1 (Left Toe Great) 1. Cleansed with: Clean wound with Normal Saline 2. Anesthetic Topical Lidocaine 4% cream to wound bed prior to debridement 4. Dressing Applied: Santyl Ointment 5. Secondary Dressing Applied Dry Gauze Kerlix/Conform Notes Conform and tape Wound #2 (Midline Abdomen - Lower Quadrant) 1. Cleansed with: Clean wound with Normal Saline 2. Anesthetic Topical Lidocaine 4% cream to wound bed prior to debridement 4. Dressing Applied: Iodoform packing Gauze 5. Secondary  [place] Signature(s) Signed: 01/25/2018 3:46:42 PM By: Lawanda Cousins Entered By: Lawanda Cousins on 01/25/2018 15:46:41 [name] ([id number]) -------------------------------------------------------------------------------- [place] Details Patient Name: Heather Dear A. Date of Service: 01/25/2018 2:30 PM Medical Record Number: [id number] Patient Account Number: [id number] Date of Birth/Sex: Dec 16, 1975 (42 y.o. F) Treating RN: Ahmed Prima Primary Care Eulon Allnutt: Suzan Garibaldi Other Clinician: Referring Whit Bruni: Suzan Garibaldi Treating Louiza Moor/Extender: Cathie Olden in Treatment: 1 Active Inactive ` Abuse / Safety / Falls / Self Care Management Nursing Diagnoses: Potential for falls Goals: Patient will not experience any injury related to falls Date Initiated: 01/18/2018 Target Resolution Date: 04/28/2018 Goal Status: Active Interventions: Assess fall risk on admission and as needed Assess: immobility, friction, shearing, incontinence upon admission and as needed Assess impairment of mobility on admission and as needed per policy Assess personal safety and home safety (as indicated) on admission and as needed Assess self care needs  on admission and as needed Notes: ` Nutrition Nursing Diagnoses: Imbalanced nutrition Impaired glucose control: actual or potential Potential for alteratiion in Nutrition/Potential for imbalanced nutrition Goals: Patient/caregiver agrees to and verbalizes understanding of need to use nutritional supplements and/or vitamins as prescribed Date Initiated: 01/18/2018 Target Resolution Date: 04/28/2018 Goal Status: Active Patient/caregiver will maintain therapeutic glucose control Date Initiated: 01/18/2018 Target Resolution Date: 04/28/2018 Goal Status: Active Interventions: Assess patient nutrition upon admission and as needed per policy Provide education on elevated blood sugars and  impact on wound healing Notes: VONZELLA, ALTHAUS ([id number]) Orientation to the Wound Care Program Nursing Diagnoses: Knowledge deficit related to the wound healing center program Goals: Patient/caregiver will verbalize understanding of the [place] Date Initiated: 01/18/2018 Target Resolution Date: 01/27/2018 Goal Status: Active Interventions: Provide education on orientation to the wound center Notes: ` Wound/Skin Impairment Nursing Diagnoses: Impaired tissue integrity Knowledge deficit related to smoking impact on wound healing Knowledge deficit related to ulceration/compromised skin integrity Goals: Ulcer/skin breakdown will have a volume reduction of 80% by week 12 Date Initiated: 01/18/2018 Target Resolution Date: 04/21/2018 Goal Status: Active Interventions: Assess patient/caregiver ability to perform ulcer/skin care regimen upon admission and as needed Assess ulceration(s) every visit Notes: Electronic Signature(s) Signed: 01/25/2018 4:32:30 PM By: Alric Quan Entered By: Alric Quan on 01/25/2018 14:42:15 Hartney, Jamiyah A. ([id number]) -------------------------------------------------------------------------------- Pain Assessment Details Patient Name: Heather Dear A. Date of Service: 01/25/2018 2:30 PM Medical Record Number: [id number] Patient Account Number: [id number] Date of Birth/Sex: 1976/08/31 (42 y.o. F) Treating RN: Montey Hora Primary Care Keyshawn Hellwig: Suzan Garibaldi Other Clinician: Referring Fujie Dickison: Suzan Garibaldi Treating Brandn Mcgath/Extender: Cathie Olden in Treatment: 1 Active Problems Location of Pain Severity and Description of Pain Patient Has Paino No Site Locations Pain Management and Medication Current Pain Management: Electronic Signature(s) Signed: 01/25/2018 4:24:22 PM By: Montey Hora Entered By: Montey Hora on 01/25/2018 14:29:06 [name]  ([id number]) -------------------------------------------------------------------------------- Patient/Caregiver Education Details Patient Name: Heather Dear A. Date of Service: 01/25/2018 2:30 PM Medical Record Number: [id number] Patient Account Number: [id number] Date of Birth/Gender: Sep 09, 1976 (42 y.o. F) Treating RN: Roger Shelter Primary Care Physician: Suzan Garibaldi Other Clinician: Referring Physician: Suzan Garibaldi Treating Physician/Extender: Cathie Olden in Treatment: 1 Education Assessment Education Provided To: Patient Education Topics Provided Wound Debridement: Handouts: Wound Debridement Responses: State content correctly Wound/Skin Impairment: Handouts: Caring for Your Ulcer Methods: Explain/Verbal Responses: State content correctly Electronic Signature(s) Signed: 01/26/2018 10:00:44 AM By: Roger Shelter Entered By: Roger Shelter on 01/25/2018 15:20:09 [place], [place] ([id number]) -------------------------------------------------------------------------------- Wound Assessment Details Patient Name: Heather Dear A. Date of Service: 01/25/2018 2:30 PM Medical Record Number: [id number] Patient Account Number: [id number] Date of Birth/Sex: [birth year] (43 y.o. F) Treating RN: Montey Hora Primary Care Ronson Hagins: Suzan Garibaldi Other Clinician: Referring Shaianne Nucci: Suzan Garibaldi Treating Norberto Wishon/Extender: Cathie Olden in Treatment: 1 Wound Status Wound Number: 1 Primary Diabetic Wound/Ulcer of the Lower Extremity Etiology: Wound Location: Left Toe Great Wound Open Wounding Event: Gradually Appeared Status: Date Acquired: 01/01/2018 Comorbid Chronic Obstructive Pulmonary Disease Weeks Of Treatment: 1 History: (COPD), Congestive Heart Failure, Clustered Wound: No Hypertension, Type II Diabetes, Neuropathy, Pending Amputation On Presentation Seizure Disorder Photos Photo Uploaded By: Montey Hora on 01/25/2018 14:44:59 Wound  Measurements Length: (cm) 2.1 Width: (cm) 1.8 Depth: (cm) 0.2 Area: (cm) 2.969 Volume: (cm) 0.594 % Reduction in Area: 61% % Reduction in Volume: 80.5% Epithelialization: None Tunneling: No Undermining: No Wound Description Classification: Grade 2 Wound Margin: Flat and Intact Exudate Amount: Large Exudate Type:  Serous Exudate Color: amber Foul Odor After Cleansing: Yes Due to Product Use: No Slough/Fibrino Yes Wound Bed Granulation Amount: Large (67-100%) Exposed Structure Granulation Quality: Red, Pink Fascia Exposed: No Necrotic Amount: Small (1-33%) Fat Layer (Subcutaneous Tissue) Exposed: Yes Necrotic Quality: Eschar, Adherent Slough Tendon Exposed: No Muscle Exposed: No Joint Exposed: No Bone Exposed: No Zuver, Tamyrah A. ([id number]) Periwound Skin Texture Texture Color No Abnormalities Noted: No No Abnormalities Noted: No Callus: No Atrophie Blanche: No Crepitus: No Cyanosis: No Excoriation: No Ecchymosis: No Induration: No Erythema: Yes Rash: No Erythema Location: Circumferential Scarring: No Hemosiderin Staining: No Mottled: No Moisture Pallor: No No Abnormalities Noted: No Rubor: No Dry / Scaly: No Maceration: No Temperature / Pain Temperature: No Abnormality Wound Preparation Ulcer Cleansing: Rinsed/Irrigated with Saline Topical Anesthetic Applied: Other: lidocaine 4%, Treatment Notes Wound #1 (Left Toe Great) 1. Cleansed with: Clean wound with Normal Saline 2. Anesthetic Topical Lidocaine 4% cream to wound bed prior to debridement 4. Dressing Applied: Santyl Ointment 5. Secondary Dressing Applied Dry Gauze Kerlix/Conform Notes Conform and tape Electronic Signature(s) Signed: 01/25/2018 4:24:22 PM By: Montey Hora Entered By: Montey Hora on 01/25/2018 14:33:11 [place], Alzora A. ([id number]) -------------------------------------------------------------------------------- Wound Assessment Details Patient Name: Heather Dear A. Date of Service: 01/25/2018 2:30 PM Medical Record Number: [id number] Patient Account Number: [id number] Date of Birth/Sex: [birth year] (42 y.o. F) Treating RN: Montey Hora Primary Care Breniya Goertzen: Suzan Garibaldi Other Clinician: Referring Derren Suydam: Suzan Garibaldi Treating Jedd Schulenburg/Extender: Cathie Olden in Treatment: 1 Wound Status Wound Number: 2 Primary Cyst Etiology: Wound Location: Abdomen - Lower Quadrant - Midline Wound Open Wounding Event: Pimple Status: Date Acquired: 01/19/2018 Comorbid Chronic Obstructive Pulmonary Disease Weeks Of Treatment: 0 History: (COPD), Congestive Heart Failure, Clustered Wound: No Hypertension, Type II Diabetes, Neuropathy, Seizure Disorder Photos Photo Uploaded By: Montey Hora on 01/25/2018 14:44:59 Wound Measurements Length: (cm) 0.4 Width: (cm) 0.6 Depth: (cm) 0.1 Area: (cm) 0.188 Volume: (cm) 0.019 % Reduction in Area: % Reduction in Volume: Epithelialization: None Tunneling: No Undermining: No Wound Description Classification: Partial Thickness Wound Margin: Flat and Intact Exudate Amount: Large Exudate Type: Sanguinous Exudate Color: red Foul Odor After Cleansing: No Slough/Fibrino Yes Wound Bed Granulation Amount: None Present (0%) Exposed Structure Necrotic Amount: Large (67-100%) Fascia Exposed: No Necrotic Quality: Eschar, Adherent Slough Fat Layer (Subcutaneous Tissue) Exposed: No Tendon Exposed: No Muscle Exposed: No Joint Exposed: No Bone Exposed: No Chipman, Kelle A. ([id number]) Periwound Skin Texture Texture Color No Abnormalities Noted: No No Abnormalities Noted: No Callus: No Atrophie Blanche: No Crepitus: No Cyanosis: No Excoriation: No Ecchymosis: Yes Induration: No Erythema: Yes Rash: No Erythema Location: Circumferential Scarring: No Hemosiderin Staining: No Mottled: No Moisture Pallor: No No Abnormalities Noted: No Rubor: No Dry / Scaly: No Maceration: No  Temperature / Pain Temperature: No Abnormality Tenderness on Palpation: Yes Wound Preparation Ulcer Cleansing: Rinsed/Irrigated with Saline Topical Anesthetic Applied: Other: lidocaine 4%, Treatment Notes Wound #2 (Midline Abdomen - Lower Quadrant) 1. Cleansed with: Clean wound with Normal Saline 2. Anesthetic Topical Lidocaine 4% cream to wound bed prior to debridement 4. Dressing Applied: Iodoform packing Gauze 5. Secondary [place] Signature(s) Signed: 01/25/2018 4:24:22 PM By: Montey Hora Entered By: Montey Hora on 01/25/2018 14:37:03 [name] ([id number]) -------------------------------------------------------------------------------- [place] Details Patient Name: Heather Dear A. Date of Service: 01/25/2018 2:30 PM Medical Record Number: [id number] Patient Account Number: [id number] Date of Birth/Sex: [birth year] (42 y.o. F) Treating RN: Montey Hora Primary Care Saja Bartolini: Suzan Garibaldi Other Clinician: Referring Mishal Probert: Suzan Garibaldi Treating  Orva Riles/Extender: Lawanda Cousins Weeks in Treatment: 1 Vital Signs Time Taken: 14:29 Temperature (F): 98.7 Height (in): 69 Pulse (bpm): 92 Weight (lbs): 250 Respiratory Rate (breaths/min): 16 Body Mass Index (BMI): 36.9 Blood Pressure (mmHg): 118/89 Reference Range: 80 - 120 mg / dl Electronic Signature(s) Signed: 01/25/2018 4:24:22 PM By: Montey Hora Entered By: Montey Hora on 01/25/2018 14:29:25

## 2018-01-28 LAB — AEROBIC CULTURE W GRAM STAIN (SUPERFICIAL SPECIMEN)

## 2018-02-01 ENCOUNTER — Encounter: Payer: Medicaid Other | Admitting: Nurse Practitioner

## 2018-02-01 DIAGNOSIS — E1151 Type 2 diabetes mellitus with diabetic peripheral angiopathy without gangrene: Secondary | ICD-10-CM | POA: Diagnosis not present

## 2018-02-08 ENCOUNTER — Encounter: Payer: Medicaid Other | Admitting: Nurse Practitioner

## 2018-02-08 DIAGNOSIS — E1151 Type 2 diabetes mellitus with diabetic peripheral angiopathy without gangrene: Secondary | ICD-10-CM | POA: Diagnosis not present

## 2018-02-09 NOTE — Progress Notes (Signed)
[name], [name] ([id number]) Visit Report for 02/01/2018 Arrival Information Details Patient Name: [name], [name] Date of Service: 02/01/2018 1:00 PM Medical Record Number: [id number] Patient Account Number: [id number] Date of Birth/Sex: 05/21/1976 (42 y.o. F) Treating RN: Montey Hora Primary Care Lothar Prehn: Suzan Garibaldi Other Clinician: Referring Velva Molinari: Suzan Garibaldi Treating Norelle Runnion/Extender: Cathie Olden in Treatment: 2 Visit Information History Since Last Visit Added or deleted any medications: No Patient Arrived: Wheel Chair Any new allergies or adverse reactions: No Arrival Time: 13:06 Had a fall or experienced change in No Accompanied By: mother activities of daily living that may affect Transfer Assistance: None risk of falls: Patient Identification Verified: Yes Signs or symptoms of abuse/neglect since last visito No Secondary Verification Process Completed: Yes Hospitalized since last visit: No Patient Has Alerts: Yes Implantable device outside of the clinic excluding No Patient Alerts: DMII cellular tissue based products placed in the center since last visit: Has Dressing in Place as Prescribed: Yes Pain Present Now: No Electronic Signature(s) Signed: 02/01/2018 3:01:24 PM By: Montey Hora Entered By: Montey Hora on 02/01/2018 13:07:19 [name] ([id number]) -------------------------------------------------------------------------------- Encounter Discharge Information Details Patient Name: Heather Dear A. Date of Service: 02/01/2018 1:00 PM Medical Record Number: [id number] Patient Account Number: [id number] Date of Birth/Sex: 04-22-1976 (42 y.o. F) Treating RN: Ahmed Prima Primary Care Reagann Dolce: Suzan Garibaldi Other Clinician: Referring Zhanae Proffit: Suzan Garibaldi Treating Tysheem Accardo/Extender: Cathie Olden in Treatment: 2 Encounter Discharge Information Items Discharge Pain Level: 0 Discharge Condition: Stable Ambulatory  Status: Wheelchair Discharge Destination: Home Transportation: Private Auto Schedule Follow-up Appointment: Yes Medication Reconciliation completed and No provided to Patient/Care Victorio Creeden: Provided on Clinical Summary of Care: 02/01/2018 Form Type Recipient Paper Patient [place] Electronic Signature(s) Signed: 02/02/2018 4:04:12 PM By: Roger Shelter Entered By: Roger Shelter on 02/01/2018 14:01:16 [name], [name] ([id number]) -------------------------------------------------------------------------------- Lower Extremity Assessment Details Patient Name: Heather Dear A. Date of Service: 02/01/2018 1:00 PM Medical Record Number: [id number] Patient Account Number: [id number] Date of Birth/Sex: 11-22-1975 (42 y.o. F) Treating RN: Montey Hora Primary Care [place] Kuang: Suzan Garibaldi Other Clinician: Referring Eldo Umanzor: Suzan Garibaldi Treating Shae Hinnenkamp/Extender: Cathie Olden in Treatment: 2 Vascular Assessment Claudication: Claudication Assessment [Left:None] Pulses: Dorsalis Pedis Palpable: [Left:Yes] Posterior Tibial Extremity colors, hair growth, and conditions: Extremity Color: [Left:Normal] Hair Growth on Extremity: [Left:Yes] Temperature of Extremity: [Left:Warm] Capillary Refill: [Left:< 3 seconds] Notes toes and distal foot are red and hot Electronic Signature(s) Signed: 02/01/2018 3:01:24 PM By: Montey Hora Entered By: Montey Hora on 02/01/2018 13:20:36 [name], [name] ([id number]) -------------------------------------------------------------------------------- Multi Wound Chart Details Patient Name: Heather Dear A. Date of Service: 02/01/2018 1:00 PM Medical Record Number: [id number] Patient Account Number: [id number] Date of Birth/Sex: [birth year] (42 y.o. 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] [3:Diabetic Wound/Ulcer of the Lower Extremity] Comorbid History: [1:Chronic Obstructive Pulmonary Disease (COPD), Pulmonary Disease (COPD), Pulmonary Disease (COPD), Congestive Heart Failure, Hypertension, Type II Diabetes, Neuropathy, Seizure Diabetes, Neuropathy, Seizure Diabetes, Neuropathy,  Seizure Disorder] [2:Chronic Obstructive Congestive Heart Failure, Hypertension, Type II Disorder] [3:Chronic Obstructive Congestive Heart Failure, Hypertension, Type II Disorder] Date Acquired: [1:01/01/2018] [  2:01/19/2018] [3:01/29/2018] Weeks of Treatment: [1:2] [2:1] [3:0] Wound Status: [1:Open] [2:Open] [3:Open] Pending Amputation on [1:Yes] [2:No] [3:No] Presentation: Measurements L x W x D [1:2x1.5x0.2] [2:0.6x1.4x0.2] [3:1x0.6x0.1] (cm) Area (cm) : [1:2.356] [2:0.66] [3:0.471] Volume (cm) : [1:0.471] [2:0.132] [3:0.047] % Reduction in Area: [1:69.00%] [2:-251.10%] [3:53.90%] % Reduction in Volume: [1:84.50%] [2:-594.70%] [3:53.90%] Classification: [1:Grade 2] [2:Partial Thickness] [3:Grade 1] Exudate Amount: [1:Large] [2:Large] [3:Medium] Exudate Type: [1:Serous] [2:Sanguinous] [3:Serous] Exudate Color: [1:amber] [2:red] [3:amber] Foul Odor After Cleansing: [1:Yes] [2:No] [3:No] Odor Anticipated Due to [1:No] [2:N/A] [3:N/A] Product Use: Wound Margin: [1:Flat and Intact] [2:Flat and Intact] [3:Flat and Intact] Granulation Amount: [1:Large (67-100%)] [2:Large (67-100%)] [3:Large (67-100%)] Granulation Quality: [1:Red, Pink] [2:Red]  [3:Red] Necrotic Amount: [1:Small (1-33%)] [2:Small (1-33%)] [3:Small (1-33%)] Necrotic Tissue: [1:Eschar, Adherent Slough] [2:Adherent Slough] [3:Adherent Slough] Exposed Structures: [1:Fat Layer (Subcutaneous Tissue) Exposed: Yes] [2:Fascia: No Fat Layer (Subcutaneous] [3:Fascia: No Fat Layer (Subcutaneous] Fascia: No Tissue) Exposed: No Tissue) Exposed: No Tendon: No Tendon: No Tendon: No Muscle: No Muscle: No Muscle: No Joint: No Joint: No Joint: No Bone: No Bone: No Bone: No Epithelialization: None None None Debridement: Debridement - Excisional N/A Debridement - Excisional Pre-procedure 13:35 N/A 13:35 Verification/Time Out Taken: Pain Control: Lidocaine 4% Topical Solution N/A Lidocaine 4% Topical Solution Tissue Debrided: Subcutaneous, Slough N/A Subcutaneous, Slough Level: Skin/Subcutaneous Tissue N/A Skin/Subcutaneous Tissue Debridement Area (sq cm): 3.04 N/A 0.6 Instrument: Curette N/A Curette Bleeding: Minimum N/A Minimum Hemostasis Achieved: Pressure N/A Pressure Procedural Pain: 0 N/A 0 Post Procedural Pain: 0 N/A 0 Debridement Treatment Procedure was tolerated well N/A Procedure was tolerated well Response: Post Debridement 1.9x1.6x0.2 N/A 1x0.6x0.2 Measurements L x W x D (cm) Post Debridement Volume: 0.478 N/A 0.094 (cm) Periwound Skin Texture: Excoriation: No Excoriation: No Excoriation: No Induration: No Induration: No Induration: No Callus: No Callus: No Callus: No Crepitus: No Crepitus: No Crepitus: No Rash: No Rash: No Rash: No Scarring: No Scarring: No Scarring: No Periwound Skin Moisture: Maceration: Yes Maceration: No Maceration: Yes Dry/Scaly: No Dry/Scaly: No Dry/Scaly: No Periwound Skin Color: Erythema: Yes Ecchymosis: Yes Atrophie Blanche: No Atrophie Blanche: No Erythema: Yes Cyanosis: No Cyanosis: No Atrophie Blanche: No Ecchymosis: No Ecchymosis: No Cyanosis: No Erythema: No Hemosiderin Staining:  No Hemosiderin Staining: No Hemosiderin Staining: No Mottled: No Mottled: No Mottled: No Pallor: No Pallor: No Pallor: No Rubor: No Rubor: No Rubor: No Erythema Location: Circumferential Circumferential N/A Temperature: No Abnormality No Abnormality No Abnormality Tenderness on Palpation: No Yes Yes Wound Preparation: Ulcer Cleansing: Ulcer Cleansing: Ulcer Cleansing: Rinsed/Irrigated with Saline Rinsed/Irrigated with Saline Rinsed/Irrigated with Saline Topical Anesthetic Applied: Topical Anesthetic Applied: Topical Anesthetic Applied: Other: lidocaine 4% Other: lidocaine 4% Other: lidocaine 4% Procedures Performed: Debridement N/A Debridement Treatment Notes Electronic Signature(s) Signed: 02/01/2018 1:54:13 PM By: Lawanda Cousins Entered By: Lawanda Cousins on 02/01/2018 13:54:13 [name] ([id number]) [name] ([id number]) -------------------------------------------------------------------------------- [place] Details Patient Name: Heather Dear A. Date of Service: 02/01/2018 1:00 PM Medical Record Number: [id number] Patient Account Number: [id number] Date of Birth/Sex: 1976-08-15 (42 y.o. F) Treating RN: Ahmed Prima Primary Care Dagoberto Nealy: Suzan Garibaldi Other Clinician: Referring Anothy Bufano: Suzan Garibaldi Treating Rod Majerus/Extender: Cathie Olden in Treatment: 2 Active Inactive ` Abuse / Safety / Falls / Self Care Management Nursing Diagnoses: Potential for falls Goals: Patient will not experience any injury related to falls Date Initiated: 01/18/2018 Target Resolution Date: 04/28/2018 Goal Status: Active Interventions: Assess fall risk on admission and as needed Assess: immobility, friction, shearing, incontinence upon admission and as needed Assess impairment  of mobility on admission and as needed per policy Assess personal safety and home safety (as indicated) on admission and as needed Assess self care needs on  admission and as needed Notes: ` Nutrition Nursing Diagnoses: Imbalanced nutrition Impaired glucose control: actual or potential Potential for alteratiion in Nutrition/Potential for imbalanced nutrition Goals: Patient/caregiver agrees to and verbalizes understanding of need to use nutritional supplements and/or vitamins as prescribed Date Initiated: 01/18/2018 Target Resolution Date: 04/28/2018 Goal Status: Active Patient/caregiver will maintain therapeutic glucose control Date Initiated: 01/18/2018 Target Resolution Date: 04/28/2018 Goal Status: Active Interventions: Assess patient nutrition upon admission and as needed per policy Provide education on elevated blood sugars and impact on wound healing Notes: [name], [name] ([id number]) Orientation to the Wound Care Program Nursing Diagnoses: Knowledge deficit related to the wound healing center program Goals: Patient/caregiver will verbalize understanding of the [place] Date Initiated: 01/18/2018 Target Resolution Date: 01/27/2018 Goal Status: Active Interventions: Provide education on orientation to the wound center Notes: ` Wound/Skin Impairment Nursing Diagnoses: Impaired tissue integrity Knowledge deficit related to smoking impact on wound healing Knowledge deficit related to ulceration/compromised skin integrity Goals: Ulcer/skin breakdown will have a volume reduction of 80% by week 12 Date Initiated: 01/18/2018 Target Resolution Date: 04/21/2018 Goal Status: Active Interventions: Assess patient/caregiver ability to perform ulcer/skin care regimen upon admission and as needed Assess ulceration(s) every visit Notes: Electronic Signature(s) Signed: 02/05/2018 4:22:27 PM By: Alric Quan Entered By: Alric Quan on 02/01/2018 13:34:56 [name], [name] ([id number]) -------------------------------------------------------------------------------- Pain Assessment Details Patient Name: Heather Dear A. Date of Service: 02/01/2018 1:00 PM Medical Record Number: [id number] Patient Account Number: [id number] Date of Birth/Sex: 04/01/1976 (42 y.o. F) Treating RN: Montey Hora Primary Care Angeleigh Chiasson: Suzan Garibaldi Other Clinician: Referring Starleen Trussell: Suzan Garibaldi Treating Elianys Conry/Extender: Cathie Olden in Treatment: 2 Active Problems Location of Pain Severity and Description of Pain Patient Has Paino No Site Locations Pain Management and Medication Current Pain Management: Electronic Signature(s) Signed: 02/01/2018 3:01:24 PM By: Montey Hora Entered By: Montey Hora on 02/01/2018 13:07:27 [name] ([id number]) -------------------------------------------------------------------------------- Patient/Caregiver Education Details Patient Name: Heather Dear A. Date of Service: 02/01/2018 1:00 PM Medical Record Number: [id number] Patient Account Number: [id number] Date of Birth/Gender: Nov 29, 1975 (42 y.o. F) Treating RN: Roger Shelter Primary Care Physician: Suzan Garibaldi Other Clinician: Referring Physician: Suzan Garibaldi Treating Physician/Extender: Cathie Olden in Treatment: 2 Education Assessment Education Provided To: Patient Education Topics Provided Wound Debridement: Handouts: Wound Debridement Methods: Explain/Verbal Responses: State content correctly Wound/Skin Impairment: Handouts: Caring for Your Ulcer Methods: Explain/Verbal Responses: State content correctly Electronic Signature(s) Signed: 02/02/2018 4:04:12 PM By: Roger Shelter Entered By: Roger Shelter on 02/01/2018 14:01:30 [name], [name] ([id number]) -------------------------------------------------------------------------------- Wound Assessment Details Patient Name: Heather Dear A. Date of Service: 02/01/2018 1:00 PM Medical Record Number: [id number] Patient Account Number: [id number] Date of Birth/Sex: 1976/05/01 (42 y.o. F) Treating RN: Ahmed Prima Primary Care Kaicee Scarpino: Suzan Garibaldi Other Clinician: Referring Buffie Herne: Suzan Garibaldi Treating Nelani Schmelzle/Extender: Cathie Olden in Treatment: 2 Wound Status Wound Number: 1 Primary Diabetic Wound/Ulcer of the Lower Extremity Etiology: Wound Location: Left Toe Great Wound Open Wounding Event: Gradually Appeared Status: Date Acquired: 01/01/2018 Comorbid Chronic Obstructive Pulmonary Disease Weeks Of Treatment: 2 History: (COPD), Congestive Heart Failure, Clustered Wound: No Hypertension, Type II Diabetes, Neuropathy, Pending Amputation On Presentation Seizure Disorder Photos Photo Uploaded By: Montey Hora on 02/01/2018 15:00:14 Wound Measurements Length: (cm) 2 Width: (cm) 1.5 Depth: (cm) 0.2 Area: (cm) 2.356 Volume: (cm) 0.471 % Reduction in  Area: 69% % Reduction in Volume: 84.5% Epithelialization: None Tunneling: No Undermining: No Wound Description Classification: Grade 2 Wound Margin: Flat and Intact Exudate Amount: Large Exudate Type: Serous Exudate Color: amber Foul Odor After Cleansing: Yes Due to Product Use: No Slough/Fibrino Yes Wound Bed Granulation Amount: Large (67-100%) Exposed Structure Granulation Quality: Red, Pink Fascia Exposed: No Necrotic Amount: Small (1-33%) Fat Layer (Subcutaneous Tissue) Exposed: Yes Necrotic Quality: Eschar, Adherent Slough Tendon Exposed: No Muscle Exposed: No Joint Exposed: No Bone Exposed: No [name], [name] ([id number]) Periwound Skin Texture Texture Color No Abnormalities Noted: No No Abnormalities Noted: No Callus: No Atrophie Blanche: No Crepitus: No Cyanosis: No Excoriation: No Ecchymosis: No Induration: No Erythema: Yes Rash: No Erythema Location: Circumferential Scarring: No Hemosiderin Staining: No Mottled: No Moisture Pallor: No No Abnormalities Noted: No Rubor: No Dry / Scaly: No Maceration: Yes Temperature / Pain Temperature: No Abnormality Wound Preparation Ulcer  Cleansing: Rinsed/Irrigated with Saline Topical Anesthetic Applied: Other: lidocaine 4%, Electronic Signature(s) Signed: 02/05/2018 4:22:27 PM By: Alric Quan Entered By: Alric Quan on 02/01/2018 13:40:57 [name] ([id number]) -------------------------------------------------------------------------------- Wound Assessment Details Patient Name: Heather Dear A. Date of Service: 02/01/2018 1:00 PM Medical Record Number: [id number] Patient Account Number: [id number] Date of Birth/Sex: 1976-07-18 (42 y.o. F) Treating RN: Montey Hora Primary Care Zona Pedro: Suzan Garibaldi Other Clinician: Referring Tessica Cupo: Suzan Garibaldi Treating Liyah Higham/Extender: Cathie Olden in Treatment: 2 Wound Status Wound Number: 2 Primary Cyst Etiology: Wound Location: Abdomen - Lower Quadrant - Midline Wound Open Wounding Event: Pimple Status: Date Acquired: 01/19/2018 Comorbid Chronic Obstructive Pulmonary Disease Weeks Of Treatment: 1 History: (COPD), Congestive Heart Failure, Clustered Wound: No Hypertension, Type II Diabetes, Neuropathy, Seizure Disorder Photos Photo Uploaded By: Montey Hora on 02/01/2018 15:00:14 Wound Measurements Length: (cm) 0.6 Width: (cm) 1.4 Depth: (cm) 0.2 Area: (cm) 0.66 Volume: (cm) 0.132 % Reduction in Area: -251.1% % Reduction in Volume: -594.7% Epithelialization: None Tunneling: No Undermining: No Wound Description Classification: Partial Thickness Wound Margin: Flat and Intact Exudate Amount: Large Exudate Type: Sanguinous Exudate Color: red Foul Odor After Cleansing: No Slough/Fibrino Yes Wound Bed Granulation Amount: Large (67-100%) Exposed Structure Granulation Quality: Red Fascia Exposed: No Necrotic Amount: Small (1-33%) Fat Layer (Subcutaneous Tissue) Exposed: No Necrotic Quality: Adherent Slough Tendon Exposed: No Muscle Exposed: No Joint Exposed: No Bone Exposed: No [name], [name] ([id number]) Periwound  Skin Texture Texture Color No Abnormalities Noted: No No Abnormalities Noted: No Callus: No Atrophie Blanche: No Crepitus: No Cyanosis: No Excoriation: No Ecchymosis: Yes Induration: No Erythema: Yes Rash: No Erythema Location: Circumferential Scarring: No Hemosiderin Staining: No Mottled: No Moisture Pallor: No No Abnormalities Noted: No Rubor: No Dry / Scaly: No Maceration: No Temperature / Pain Temperature: No Abnormality Tenderness on Palpation: Yes Wound Preparation Ulcer Cleansing: Rinsed/Irrigated with Saline Topical Anesthetic Applied: Other: lidocaine 4%, Electronic Signature(s) Signed: 02/01/2018 3:01:24 PM By: Montey Hora Entered By: Montey Hora on 02/01/2018 13:18:22 [name] ([id number]) -------------------------------------------------------------------------------- Wound Assessment Details Patient Name: Heather Dear A. Date of Service: 02/01/2018 1:00 PM Medical Record Number: [id number] Patient Account Number: [id number] Date of Birth/Sex: Sep 19, 1976 (42 y.o. F) Treating RN: Ahmed Prima Primary Care Evaleigh Mccamy: Suzan Garibaldi Other Clinician: Referring Savaughn Karwowski: Suzan Garibaldi Treating Aurelia Gras/Extender: Cathie Olden in Treatment: 2 Wound Status Wound Number: 3 Primary Diabetic Wound/Ulcer of the Lower Extremity Etiology: Wound Location: Left, Lateral Toe Great Wound Open Wounding Event: Trauma Status: Date Acquired: 01/29/2018 Comorbid Chronic Obstructive Pulmonary Disease Weeks Of Treatment: 0 History: (COPD), Congestive Heart Failure, Clustered Wound: No  Hypertension, Type II Diabetes, Neuropathy, Seizure Disorder Photos Photo Uploaded By: Montey Hora on 02/01/2018 15:00:37 Wound Measurements Length: (cm) 1 Width: (cm) 0.6 Depth: (cm) 0.1 Area: (cm) 0.471 Volume: (cm) 0.047 % Reduction in Area: 53.9% % Reduction in Volume: 53.9% Epithelialization: None Tunneling: No Undermining: No Wound  Description Classification: Grade 1 Wound Margin: Flat and Intact Exudate Amount: Medium Exudate Type: Serous Exudate Color: amber Foul Odor After Cleansing: No Slough/Fibrino Yes Wound Bed Granulation Amount: Large (67-100%) Exposed Structure Granulation Quality: Red Fascia Exposed: No Necrotic Amount: Small (1-33%) Fat Layer (Subcutaneous Tissue) Exposed: No Necrotic Quality: Adherent Slough Tendon Exposed: No Muscle Exposed: No Joint Exposed: No Bone Exposed: No [name], [name] ([id number]) Periwound Skin Texture Texture Color No Abnormalities Noted: No No Abnormalities Noted: No Callus: No Atrophie Blanche: No Crepitus: No Cyanosis: No Excoriation: No Ecchymosis: No Induration: No Erythema: No Rash: No Hemosiderin Staining: No Scarring: No Mottled: No Pallor: No Moisture Rubor: No No Abnormalities Noted: No Dry / Scaly: No Temperature / Pain Maceration: Yes Temperature: No Abnormality Tenderness on Palpation: Yes Wound Preparation Ulcer Cleansing: Rinsed/Irrigated with Saline Topical Anesthetic Applied: Other: lidocaine 4%, Electronic Signature(s) Signed: 02/05/2018 4:22:27 PM By: Alric Quan Entered By: Alric Quan on 02/01/2018 13:40:57 [name] ([id number]) -------------------------------------------------------------------------------- Vitals Details Patient Name: Heather Dear A. Date of Service: 02/01/2018 1:00 PM Medical Record Number: [id number] Patient Account Number: [id number] Date of Birth/Sex: 1976/04/19 (42 y.o. F) Treating RN: Montey Hora Primary Care Breeana Sawtelle: Suzan Garibaldi Other Clinician: Referring Neidy Guerrieri: Suzan Garibaldi Treating Johnnell Liou/Extender: Cathie Olden in Treatment: 2 Vital Signs Time Taken: 13:10 Temperature (F): 98.4 Height (in): 69 Pulse (bpm): 90 Weight (lbs): 250 Respiratory Rate (breaths/min): 18 Body Mass Index (BMI): 36.9 Blood Pressure (mmHg): 104/52 Reference Range: 80 - 120  mg / dl Electronic Signature(s) Signed: 02/01/2018 3:01:24 PM By: Montey Hora Entered By: Montey Hora on 02/01/2018 13:10:43

## 2018-02-09 NOTE — Progress Notes (Signed)
[name], [name] ([id number]) Visit Report for 02/01/2018 Chief Complaint Document Details Patient Name: [name], [name] Date of Service: 02/01/2018 1:00 PM Medical Record Number: [id number] Patient Account Number: [id number] Date of Birth/Sex: [birth year] (42 y.o. F) Treating RN: Ahmed Prima Primary Care Provider: Suzan Garibaldi Other Clinician: Referring Provider: Suzan Garibaldi Treating Provider/Extender: Cathie Olden in Treatment: 2 Information Obtained from: Patient Chief Complaint She is here for evaluation of a left great toe ulcer Electronic Signature(s) Signed: 02/01/2018 1:54:29 PM By: Lawanda Cousins Entered By: Lawanda Cousins on 02/01/2018 13:54:28 Mccuen, Yamileth A. ([id number]) -------------------------------------------------------------------------------- Debridement Details Patient Name: Heather Dear A. Date of Service: 02/01/2018 1:00 PM Medical Record Number: [id number] Patient Account Number: [id number] Date of Birth/Sex: 1976-07-01 (42 y.o. F) Treating RN: Ahmed Prima Primary Care Provider: Suzan Garibaldi Other Clinician: Referring Provider: Suzan Garibaldi Treating Provider/Extender: Cathie Olden in Treatment: 2 Debridement Performed for Wound #1 Left Toe Great Assessment: Performed By: Physician Lawanda Cousins, NP Debridement Type: Debridement Severity of Tissue Pre Fat layer exposed Debridement: Pre-procedure Verification/Time Yes - 13:35 Out Taken: Start Time: 13:36 Pain Control: Lidocaine 4% Topical Solution Total Area Debrided (L x W): 1.9 (cm) x 1.6 (cm) = 3.04 (cm) Tissue and other material Viable, Non-Viable, Slough, Subcutaneous, Fibrin/Exudate, Slough debrided: Level: Skin/Subcutaneous Tissue Debridement Description: Excisional Instrument: Curette Bleeding: Minimum Hemostasis Achieved: Pressure End Time: 13:41 Procedural Pain: 0 Post Procedural Pain: 0 Response to Treatment: Procedure was tolerated well Post Debridement  Measurements of Total Wound Length: (cm) 1.9 Width: (cm) 1.6 Depth: (cm) 0.2 Volume: (cm) 0.478 Character of Wound/Ulcer Post Debridement: Requires Further Debridement Severity of Tissue Post Debridement: Fat layer exposed Post Procedure Diagnosis Same as Pre-procedure Electronic Signature(s) Signed: 02/01/2018 3:26:23 PM By: Lawanda Cousins Signed: 02/05/2018 4:22:27 PM By: Alric Quan Entered By: Alric Quan on 02/01/2018 13:41:33 [name], [name] ([id number]) -------------------------------------------------------------------------------- Debridement Details Patient Name: Heather Dear A. Date of Service: 02/01/2018 1:00 PM Medical Record Number: [id number] Patient Account Number: [id number] Date of Birth/Sex: 25-Apr-1976 (42 y.o. F) Treating RN: Ahmed Prima Primary Care Provider: Suzan Garibaldi Other Clinician: Referring Provider: Suzan Garibaldi Treating Provider/Extender: Cathie Olden in Treatment: 2 Debridement Performed for Wound #3 Left,Lateral Toe Great Assessment: Performed By: Physician Lawanda Cousins, NP Debridement Type: Debridement Severity of Tissue Pre Fat layer exposed Debridement: Pre-procedure Verification/Time Yes - 13:35 Out Taken: Start Time: 13:41 Pain Control: Lidocaine 4% Topical Solution Total Area Debrided (L x W): 1 (cm) x 0.6 (cm) = 0.6 (cm) Tissue and other material Viable, Non-Viable, Slough, Subcutaneous, Fibrin/Exudate, Slough debrided: Level: Skin/Subcutaneous Tissue Debridement Description: Excisional Instrument: Curette Bleeding: Minimum Hemostasis Achieved: Pressure End Time: 13:42 Procedural Pain: 0 Post Procedural Pain: 0 Response to Treatment: Procedure was tolerated well Post Debridement Measurements of Total Wound Length: (cm) 1 Width: (cm) 0.6 Depth: (cm) 0.2 Volume: (cm) 0.094 Character of Wound/Ulcer Post Debridement: Requires Further Debridement Severity of Tissue Post Debridement: Fat layer  exposed Post Procedure Diagnosis Same as Pre-procedure Electronic Signature(s) Signed: 02/01/2018 3:26:23 PM By: Lawanda Cousins Signed: 02/05/2018 4:22:27 PM By: Alric Quan Entered By: Alric Quan on 02/01/2018 13:42:11 [name], [name] ([id number]) -------------------------------------------------------------------------------- HPI Details Patient Name: Heather Dear A. Date of Service: 02/01/2018 1:00 PM Medical Record Number: [id number] Patient Account Number: [id number] Date of Birth/Sex: [birth year] (42 y.o. F) Treating RN: Ahmed Prima Primary Care Provider: Suzan Garibaldi Other Clinician: Referring Provider: Suzan Garibaldi Treating Provider/Extender: Cathie Olden in Treatment: 2 History of Present Illness HPI Description: 01/18/18-She is here for initial evaluation of the left great  toe ulcer. She is a poor historian in regards to timeframe in detail. She states approximately 4 weeks ago she lacerated her toe on something in the house. She followed up with her primary care who placed her on Bactrim and ultimately a second dose of Bactrim prior to coming to wound clinic. She states she has been treating the toe with peroxide, Betadine and a Band-Aid. She did not check her blood sugar this morning but checked it yesterday morning it was 327; she is unaware of a recent A1c and there are no current records. She saw Dr. she would've orthopedics last week for an old injury to the left ankle, she states he did not see her toe, nor did she bring it to his attention. She smokes approximately 1 pack cigarettes a day. Her social situation is concerning, she arrives this morning with her mother who appears extremely intoxicated/under the influence; her mother was asked to leave the room and be monitored by the patient's grandmother. The patient's aunt then accompanied the patient and the room throughout the rest of the appointment. We had a lengthy discussion regarding the  deleterious effects of uncontrolled hyperglycemia and smoking as it relates to wound healing and overall health. She was strongly encouraged to decrease her smoking and get her diabetes under better control. She states she is currently on a diet and has cut down her [facility] consumption. The left toe is erythematous, macerated and slightly edematous with malodor present. The edema in her left foot is below her baseline, there is no erythema streaking. We will treat her with Santyl, doxycycline; we have ordered and xray, culture and provided a Peg assist surgical shoe and cultured the wound. 01/25/18-She is here in follow-up evaluation for a left great toe ulcer and presents with an abscess to her suprapubic area. She states her blood sugars remain elevated, feeling "sick" and if levels are below 250, but she is trying. She has made no attempt to decrease her smoking stating that we "can't take away her food in her cigarettes". She has been compliant with offloading using the PEG assist you. She is using Santyl daily. the culture obtained last week grew staph aureus and Enterococcus faecalis; continues on the doxycycline and Augmentin was added on Monday. The suprapubic area has erythema, no femoral variation, purple discoloration, minimal induration, was accessed with a cotton tip applicator with sanguinopurulent drainage, this was cultured, I suspect the current antibiotic treatment will cover and we will not add anything to her current treatment plan. She was advised to go to urgent care or ER with any change in redness, induration or fever. 02/01/18-She is here in follow-up evaluation for left great toe ulcers and a new abdominal abscess from last week. She was able to use packing until earlier this week, where she "forgot it was there". She states she was feeling ill with GI symptoms last week and was not taking her antibiotic. She states her glucose levels have been predominantly less than 200,  with occasional levels between 200-250. She thinks this was contributing to her GI symptoms as they have resolved without intervention. There continues to be significant laceration to left toe, otherwise it clinically looks stable/improved. There is now less superficial opening to the lateral aspect of the great toe that was residual blister. We will transition to [facility] to all wounds, she will continue her Augmentin. If there is no change or deterioration next week for reculture. Electronic Signature(s) Signed: 02/01/2018 1:56:58 PM By: Rene Kocher,  Maven Rosander Entered By: Lawanda Cousins on 02/01/2018 13:56:58 [name] ([id number]) -------------------------------------------------------------------------------- Physician Orders Details Patient Name: Heather Dear A. Date of Service: 02/01/2018 1:00 PM Medical Record Number: [id number] Patient Account Number: [id number] Date of Birth/Sex: February 20, 1976 (42 y.o. F) Treating RN: Ahmed Prima Primary Care Provider: Suzan Garibaldi Other Clinician: Referring Provider: Suzan Garibaldi Treating Provider/Extender: Cathie Olden in Treatment: 2 Verbal / Phone Orders: Yes Clinician: Pinkerton, Debi Read Back and Verified: Yes Diagnosis Coding Wound Cleansing Wound #1 Left Toe Great o Clean wound with Normal Saline. o Cleanse wound with mild soap and water Wound #2 Midline Abdomen - Lower Quadrant o Clean wound with Normal Saline. o Cleanse wound with mild soap and water Wound #3 Left,Lateral Toe Great o Clean wound with Normal Saline. o Cleanse wound with mild soap and water Anesthetic (add to Medication List) Wound #1 Left Toe Great o Topical Lidocaine 4% cream applied to wound bed prior to debridement (In Clinic Only). Wound #2 Midline Abdomen - Lower Quadrant o Topical Lidocaine 4% cream applied to wound bed prior to debridement (In Clinic Only). Wound #3 Left,Lateral Toe Great o Topical Lidocaine 4% cream  applied to wound bed prior to debridement (In Clinic Only). Skin Barriers/Peri-Wound Care Wound #2 Midline Abdomen - Lower Quadrant o Skin Prep Primary Wound Dressing Wound #1 Left Toe Great o Hydrafera Blue Ready Transfer Wound #2 Midline Abdomen - Lower Quadrant o Hydrafera Blue Ready Transfer Wound #3 Left,Lateral Toe Great o Hydrafera Blue Ready Transfer Secondary Dressing Wound #1 Left Toe Great o Dry Gauze o Conform/Kerlix Wound #3 Left,Lateral Toe Great Annett, Corita A. ([id number]) o Dry Gauze o Conform/Kerlix o Dry Gauze o Conform/Kerlix Wound #2 Midline Abdomen - [place] Dressing Change Frequency Wound #1 Left Toe Great o Change dressing every day. Wound #2 Midline Abdomen - Lower Quadrant o Change dressing every other day. Wound #3 Left,Lateral Toe Great o Change dressing every other day. Follow-up Appointments Wound #1 Left Toe Great o Return Appointment in 1 week. Wound #2 Midline Abdomen - Lower Quadrant o Return Appointment in 1 week. Wound #3 Left,Lateral Toe Great o Return Appointment in 1 week. Edema Control Wound #1 Left Toe Great o Patient to wear own compression stockings o Elevate legs to the level of the heart and pump ankles as often as possible Wound #3 Left,Lateral Toe Great o Patient to wear own compression stockings o Elevate legs to the level of the heart and pump ankles as often as possible Off-Loading Wound #1 Left Toe Great o Open toe surgical shoe with peg assist. Wound #3 Left,Lateral Toe Great o Open toe surgical shoe with peg assist. Additional Orders / Instructions Wound #1 Left Toe Great o Stop Smoking o Increase protein intake. Wound #2 Midline Abdomen - Lower Quadrant o Stop Smoking o Increase protein intake. Wound #3 Left,Lateral Toe Great o Stop Smoking [name], [name] ([id number]) o Increase protein intake. Medications-please add to  medication list. Wound #1 Left Toe Great o P.O. Antibiotics - continue to take antibiotics as prescribed Patient Medications Allergies: erythromycin base Notifications Medication Indication Start End lidocaine DOSE 1 - topical 4 % cream - 1 cream topical Electronic Signature(s) Signed: 02/01/2018 3:26:23 PM By: Lawanda Cousins Signed: 02/05/2018 4:22:27 PM By: Alric Quan Entered By: Alric Quan on 02/01/2018 13:45:25 [name] ([id number]) -------------------------------------------------------------------------------- Prescription 02/01/2018 Patient Name: Heather Dear A. Provider: Lawanda Cousins NP Date of Birth: [birth year] NPI#: [id number] Sex: F DEA#: [id number] Phone #: [phone] License #:  Patient Address: [place], [place] [address] [address], [place], [place] [address] [id number] Allergies erythromycin base Medication Medication: Route: Strength: Form: lidocaine 4 % topical cream topical 4% cream Class: TOPICAL LOCAL ANESTHETICS Dose: Frequency / Time: Indication: 1 1 cream topical Number of Refills: Number of Units: 0 Generic Substitution: Start Date: End Date: One Time Use: Substitution Permitted No Note to Pharmacy: Signature(s): Date(s): Electronic Signature(s) Signed: 02/01/2018 3:26:23 PM By: Lawanda Cousins Signed: 02/05/2018 4:22:27 PM By: Alric Quan Entered By: Alric Quan on 02/01/2018 13:45:25 Amador, [name] ([id number]) --------------------------------------------------------------------------------  Problem List Details Patient Name: Heather Dear A. Date of Service: 02/01/2018 1:00 PM Medical Record Number: [id number] Patient Account Number: [id number] Date of Birth/Sex: 04-02-1976 (42 y.o. 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 ([id number]) -------------------------------------------------------------------------------- Progress Note Details Patient Name: Heather Dear A. Date of Service: 02/01/2018 1:00 PM Medical Record Number: [id number] Patient Account Number: [id number] Date of Birth/Sex: [birth year] (42 y.o. F) Treating RN: Ahmed Prima Primary Care Provider: Suzan Garibaldi Other Clinician: Referring Provider: Suzan Garibaldi Treating Provider/Extender: Cathie Olden in Treatment: 2 Subjective Chief Complaint Information obtained from Patient She is here for evaluation of a left great toe ulcer History of Present Illness (HPI) 01/18/18-She is here for initial evaluation of the left great toe ulcer. She is a poor historian in regards to timeframe in detail. She states approximately 4 weeks ago she lacerated her toe on something in the house. She followed up with her primary care who placed her on Bactrim and ultimately a second dose of Bactrim prior to coming to wound clinic. She states she has been treating the toe with peroxide, Betadine and a Band-Aid. She did not check her blood sugar this morning but checked it yesterday morning it was 327; she is unaware of a recent A1c and there are no current records. She saw Dr. she would've orthopedics last week for an old injury to the left ankle, she  states he did not see her toe, nor did she bring it to his attention. She smokes approximately 1 pack cigarettes a day. Her social situation is concerning, she arrives this morning with her mother who appears extremely intoxicated/under the influence; her mother was asked to leave the room and be monitored by the patient's grandmother. The patient's aunt then accompanied the patient and the room throughout the rest of the appointment. We had a lengthy discussion regarding the deleterious effects of uncontrolled hyperglycemia and smoking as it relates to wound healing and overall health. She was strongly encouraged to decrease her smoking and get her diabetes under better control. She states she is currently on a diet and has cut down her [facility] consumption. The left toe is erythematous, macerated and slightly edematous with malodor present. The edema in her left foot is below her baseline, there is no erythema streaking. We will treat her with Santyl,  doxycycline; we have ordered and xray, culture and provided a Peg assist surgical shoe and cultured the wound. 01/25/18-She is here in follow-up evaluation for a left great toe ulcer and presents with an abscess to her suprapubic area. She states her blood sugars remain elevated, feeling "sick" and if levels are below 250, but she is trying. She has made no attempt to decrease her smoking stating that we "can't take away her food in her cigarettes". She has been compliant with offloading using the PEG assist you. She is using Santyl daily. the culture obtained last week grew staph aureus and Enterococcus faecalis; continues on the doxycycline and Augmentin was added on Monday. The suprapubic area has erythema, no femoral variation, purple discoloration, minimal induration, was accessed with a cotton tip applicator with sanguinopurulent drainage, this was cultured, I suspect the current antibiotic treatment will cover and we will not add anything to  her current treatment plan. She was advised to go to urgent care or ER with any change in redness, induration or fever. 02/01/18-She is here in follow-up evaluation for left great toe ulcers and a new abdominal abscess from last week. She was able to use packing until earlier this week, where she "forgot it was there". She states she was feeling ill with GI symptoms last week and was not taking her antibiotic. She states her glucose levels have been predominantly less than 200, with occasional levels between 200-250. She thinks this was contributing to her GI symptoms as they have resolved without intervention. There continues to be significant laceration to left toe, otherwise it clinically looks stable/improved. There is now less superficial opening to the lateral aspect of the great toe that was residual blister. We will transition to [facility] to all wounds, she will continue her Augmentin. If there is no change or deterioration next week for reculture. Patient History Information obtained from Patient. Family History Cancer - Paternal Grandparents, Heart Disease - Paternal Grandparents, Hypertension - Paternal Grandparents, No family history of Diabetes, Hereditary Spherocytosis, Kidney Disease, Lung Disease, Seizures, Stroke, Thyroid Problems, Tuberculosis. Social History [name], [name] ([id number]) Current every day smoker, Marital Status - Single, Alcohol Use - Never, Drug Use - Prior History, Caffeine Use - Daily. Objective Constitutional Vitals Time Taken: 1:10 PM, Height: 69 in, Weight: 250 lbs, BMI: 36.9, Temperature: 98.4 F, Pulse: 90 bpm, Respiratory Rate: 18 breaths/min, Blood Pressure: 104/52 mmHg. Integumentary (Hair, Skin) Wound #1 status is Open. Original cause of wound was Gradually Appeared. The wound is located on the Left Toe Great. The wound measures 2cm length x 1.5cm width x 0.2cm depth; 2.356cm^2 area and 0.471cm^3 volume. There is Fat Layer (Subcutaneous  Tissue) Exposed exposed. There is no tunneling or undermining noted. There is a large amount of serous drainage noted. Foul odor after cleansing was noted. The wound margin is flat and intact. There is large (67-100%) red, pink granulation within the wound bed. There is a small (1-33%) amount of necrotic tissue within the wound bed including Eschar and Adherent Slough. The periwound skin appearance exhibited: Maceration, Erythema. The periwound skin appearance did not exhibit: Callus, Crepitus, Excoriation, Induration, Rash, Scarring, Dry/Scaly, Atrophie Blanche, Cyanosis, Ecchymosis, Hemosiderin Staining, Mottled, Pallor, Rubor. The surrounding wound skin color is noted with erythema which is circumferential. Periwound temperature was noted as No Abnormality. Wound #2 status is Open. Original cause of wound was Pimple. The wound is located on the Midline Abdomen - Lower Quadrant. The wound measures 0.6cm length x 1.4cm width x 0.2cm depth;  0.66cm^2 area and 0.132cm^3 volume. There is no tunneling or undermining noted. There is a large amount of sanguinous drainage noted. The wound margin is flat and intact. There is large (67-100%) red granulation within the wound bed. There is a small (1-33%) amount of necrotic tissue within the wound bed including Adherent Slough. The periwound skin appearance exhibited: Ecchymosis, Erythema. The periwound skin appearance did not exhibit: Callus, Crepitus, Excoriation, Induration, Rash, Scarring, Dry/Scaly, Maceration, Atrophie Blanche, Cyanosis, Hemosiderin Staining, Mottled, Pallor, Rubor. The surrounding wound skin color is noted with erythema which is circumferential. Periwound temperature was noted as No Abnormality. The periwound has tenderness on palpation. Wound #3 status is Open. Original cause of wound was Trauma. The wound is located on the [employer]. The wound measures 1cm length x 0.6cm width x 0.1cm depth; 0.471cm^2 area and 0.047cm^3  volume. There is no tunneling or undermining noted. There is a medium amount of serous drainage noted. The wound margin is flat and intact. There is large (67-100%) red granulation within the wound bed. There is a small (1-33%) amount of necrotic tissue within the wound bed including Adherent Slough. The periwound skin appearance exhibited: Maceration. The periwound skin appearance did not exhibit: Callus, Crepitus, Excoriation, Induration, Rash, Scarring, Dry/Scaly, Atrophie Blanche, Cyanosis, Ecchymosis, Hemosiderin Staining, Mottled, Pallor, Rubor, Erythema. Periwound temperature was noted as No Abnormality. The periwound has tenderness on palpation. Assessment Active Problems ICD-10 E11.621 - Type 2 diabetes mellitus with foot ulcer Mallery, [name] 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 There was a Excisional Skin/Subcutaneous Tissue Debridement with a total area of 3.04 sq cm performed by Lawanda Cousins, NP. With the following instrument(s): Curette. to remove Viable and Non-Viable tissue/material Material removed includes Subcutaneous Tissue, and Slough, Fibrin/Exudate, and [name] after achieving pain control using Lidocaine 4% Topical Solution. No specimens were taken. A time out was conducted at 13:35, prior to the start of the procedure. A Minimum amount of bleeding was controlled with Pressure. The procedure was tolerated well with a pain level of 0 throughout and a pain level of 0 following the procedure. Post Debridement Measurements: 1.9cm length x 1.6cm width x 0.2cm depth; 0.478cm^3 volume. Character of Wound/Ulcer Post Debridement requires  further debridement. Severity of Tissue Post Debridement is: Fat layer exposed. Post procedure Diagnosis Wound #1: Same as Pre-Procedure Wound #3 Pre-procedure diagnosis of Wound #3 is a Diabetic Wound/Ulcer of the Lower Extremity located on the Left,Lateral Toe Great .Severity of Tissue Pre Debridement is: Fat layer exposed. There was a Excisional Skin/Subcutaneous Tissue Debridement with a total area of 0.6 sq cm performed by Lawanda Cousins, NP. With the following instrument(s): Curette. to remove Viable and Non-Viable tissue/material Material removed includes Subcutaneous Tissue, and Slough, Fibrin/Exudate, and [place] after achieving pain control using Lidocaine 4% Topical Solution. No specimens were taken. A time out was conducted at 13:35, prior to the start of the procedure. A Minimum amount of bleeding was controlled with Pressure. The procedure was tolerated well with a pain level of 0 throughout and a pain level of 0 following the procedure. Post Debridement Measurements: 1cm length x 0.6cm width x 0.2cm depth; 0.094cm^3 volume. Character of Wound/Ulcer Post Debridement requires further debridement. Severity  of Tissue Post Debridement is: Fat layer exposed. Post procedure Diagnosis Wound #3: Same as Pre-Procedure Plan Wound Cleansing: Wound #1 Left Toe Great: Clean wound with Normal Saline. Cleanse wound with mild soap and water Wound #2 Midline Abdomen - Lower Quadrant: Clean wound with Normal Saline. Cleanse wound with mild soap and water Wound #3 Left,Lateral Toe Great: Clean wound with Normal Saline. Cleanse wound with mild soap and water Anesthetic (add to Medication List): Wound #1 Left Toe Great: Topical Lidocaine 4% cream applied to wound bed prior to debridement (In Clinic Only). CALYN, SIVILS A. ([id number]) Wound #2 Midline Abdomen - Lower Quadrant: Topical Lidocaine 4% cream applied to wound bed prior to debridement (In Clinic Only). Wound #3 Left,Lateral Toe  Great: Topical Lidocaine 4% cream applied to wound bed prior to debridement (In Clinic Only). Skin Barriers/Peri-Wound Care: Wound #2 Midline Abdomen - Lower Quadrant: Skin Prep Primary Wound Dressing: Wound #1 Left Toe Great: Hydrafera Blue Ready Transfer Wound #2 Midline Abdomen - Lower Quadrant: Hydrafera Blue Ready Transfer Wound #3 Left,Lateral Toe Great: Hydrafera Blue Ready Transfer Secondary Dressing: Wound #1 Left Toe Great: Dry Gauze Conform/Kerlix Wound #3 Left,Lateral Toe Great: Dry Gauze Conform/Kerlix Dry Gauze Conform/Kerlix Wound #2 Midline Abdomen - Lower Quadrant: [place] Dressing Change Frequency: Wound #1 Left Toe Great: Change dressing every day. Wound #2 Midline Abdomen - Lower Quadrant: Change dressing every other day. Wound #3 Left,Lateral Toe Great: Change dressing every other day. Follow-up Appointments: Wound #1 Left Toe Great: Return Appointment in 1 week. Wound #2 Midline Abdomen - Lower Quadrant: Return Appointment in 1 week. Wound #3 Left,Lateral Toe Great: Return Appointment in 1 week. Edema Control: Wound #1 Left Toe Great: Patient to wear own compression stockings Elevate legs to the level of the heart and pump ankles as often as possible Wound #3 Left,Lateral Toe Great: Patient to wear own compression stockings Elevate legs to the level of the heart and pump ankles as often as possible Off-Loading: Wound #1 Left Toe Great: Open toe surgical shoe with peg assist. Wound #3 Left,Lateral Toe Great: Open toe surgical shoe with peg assist. Additional Orders / Instructions: Wound #1 Left Toe Great: Stop Smoking Increase protein intake. Wound #2 Midline Abdomen - Lower Quadrant: Stop Smoking Increase protein intake. Wound #3 Left,Lateral Toe Great: Stop Smoking Gibbons, Taiana A. ([id number]) Increase protein intake. Medications-please add to medication list.: Wound #1 Left Toe Great: P.O. Antibiotics - continue to take  antibiotics as prescribed The following medication(s) was prescribed: lidocaine topical 4 % cream 1 1 cream topical was prescribed at facility Electronic Signature(s) Signed: 02/01/2018 1:57:43 PM By: Lawanda Cousins Entered By: Lawanda Cousins on 02/01/2018 13:57:43 [name] ([id number]) -------------------------------------------------------------------------------- ROS/PFSH Details Patient Name: Heather Dear A. Date of Service: 02/01/2018 1:00 PM Medical Record Number: [id number] Patient Account Number: [id number] Date of Birth/Sex: 13-Jan-1976 (42 y.o. F) Treating RN: Ahmed Prima Primary Care Provider: Suzan Garibaldi Other Clinician: Referring Provider: Suzan Garibaldi Treating Provider/Extender: Cathie Olden in Treatment: 2 Information Obtained From Patient Wound History Do you currently have one or more open woundso Yes How many open wounds do you currently haveo 1 Approximately how long have you had your woundso 1 month How have you been treating your wound(s) until nowo open to air Has your wound(s) ever healed and then re-openedo No Have you had any lab work done in the past montho No Have you tested positive for an antibiotic resistant organism (MRSA, VRE)o No Have you tested positive for osteomyelitis (bone infection)o  No Have you had any tests for circulation on your legso No Hematologic/Lymphatic Medical History: Negative for: Anemia; Hemophilia; Human Immunodeficiency Virus; Lymphedema; Sickle Cell Disease Respiratory Medical History: Positive for: Chronic Obstructive Pulmonary Disease (COPD) Negative for: Aspiration; Asthma; Pneumothorax; Sleep Apnea; Tuberculosis Cardiovascular Medical History: Positive for: Congestive Heart Failure; Hypertension Negative for: Angina; Arrhythmia; Coronary Artery Disease; Deep Vein Thrombosis; Hypotension; Myocardial Infarction; Peripheral Arterial Disease; Peripheral Venous Disease; Phlebitis;  Vasculitis Gastrointestinal Medical History: Negative for: Cirrhosis ; Colitis; Crohnos; Hepatitis A; Hepatitis B; Hepatitis C Endocrine Medical History: Positive for: Type II Diabetes Treated with: Insulin, Oral agents Genitourinary Medical History: Negative for: End Stage Renal Disease Immunological PERRI, ARAGONES A. ([id number]) Medical History: Negative for: Lupus Erythematosus; Raynaudos; Scleroderma Musculoskeletal Medical History: Negative for: Gout; Rheumatoid Arthritis; Osteoarthritis; Osteomyelitis Neurologic Medical History: Positive for: Neuropathy; Seizure Disorder Oncologic Medical History: Negative for: Received Chemotherapy; Received Radiation Immunizations Pneumococcal Vaccine: Received Pneumococcal Vaccination: Yes Immunization Notes: up to date Implantable Devices Family and Social History Cancer: Yes - Paternal Grandparents; Diabetes: No; Heart Disease: Yes - Paternal Grandparents; Hereditary Spherocytosis: No; Hypertension: Yes - Paternal Grandparents; Kidney Disease: No; Lung Disease: No; Seizures: No; Stroke: No; Thyroid Problems: No; Tuberculosis: No; Current every day smoker; Marital Status - Single; Alcohol Use: Never; Drug Use: Prior History; Caffeine Use: Daily; Financial Concerns: No; Food, Clothing or Shelter Needs: No; Support System Lacking: No; Transportation Concerns: No; Advanced Directives: No; Patient does not want information on Advanced Directives Physician Affirmation I have reviewed and agree with the above information. Electronic Signature(s) Signed: 02/01/2018 3:26:23 PM By: Lawanda Cousins Signed: 02/05/2018 4:22:27 PM By: Alric Quan Entered By: Lawanda Cousins on 02/01/2018 13:57:28 [name] ([id number]) -------------------------------------------------------------------------------- [place] Details Patient Name: Heather Dear A. Date of Service: 02/01/2018 Medical Record Number: [id number] Patient Account Number:  [id number] Date of Birth/Sex: November 26, 1975 (42 y.o. 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 L03.032 Cellulitis of left toe L02.211 Cutaneous abscess of abdominal wall Facility Procedures CPT4 Code Description: [id number] - DEB SUBQ TISSUE 20 SQ CM/< ICD-10 Diagnosis Description E11.621 Type 2 diabetes mellitus with foot ulcer 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

## 2018-02-13 NOTE — Progress Notes (Signed)
[name], [name] ([id number]) Visit Report for 02/08/2018 Arrival Information Details Patient Name: [name], [name] Date of Service: 02/08/2018 12:30 PM Medical Record Number: [id number] Patient Account Number: [id number] Date of Birth/Sex: 02-19-1976 (42 y.o. F) Treating RN: Roger Shelter Primary Care Tikita Mabee: Suzan Garibaldi Other Clinician: Referring Sha Burling: Suzan Garibaldi Treating Angel Hobdy/Extender: Cathie Olden in Treatment: 3 Visit Information History Since Last Visit All ordered tests and consults were completed: No Patient Arrived: Wheel Chair Added or deleted any medications: No Arrival Time: 12:40 Any new allergies or adverse reactions: No Accompanied By: mom Had a fall or experienced change in No Transfer Assistance: None activities of daily living that may affect Patient Identification Verified: Yes risk of falls: Secondary Verification Process Completed: Yes Signs or symptoms of abuse/neglect since last visito No Patient Has Alerts: Yes Hospitalized since last visit: No Patient Alerts: DMII Implantable device outside of the clinic excluding No cellular tissue based products placed in the center since last visit: Pain Present Now: No Electronic Signature(s) Signed: 02/08/2018 3:01:38 PM By: Roger Shelter Entered By: Roger Shelter on 02/08/2018 12:41:13 [name] ([id number]) -------------------------------------------------------------------------------- Encounter Discharge Information Details Patient Name: Heather Dear A. Date of Service: 02/08/2018 12:30 PM Medical Record Number: [id number] Patient Account Number: [id number] Date of Birth/Sex: 25-Dec-1975 (42 y.o. F) Treating RN: Ahmed Prima Primary Care Junaid Wurzer: Suzan Garibaldi Other Clinician: Referring Larhonda Dettloff: Suzan Garibaldi Treating Peyten Punches/Extender: Cathie Olden in Treatment: 3 Encounter Discharge Information Items Discharge Pain Level: 0 Discharge Condition:  Stable Ambulatory Status: Wheelchair Discharge Destination: Home Transportation: Private Auto Accompanied By: mom Schedule Follow-up Appointment: Yes Medication Reconciliation completed and No provided to Patient/Care Taydon Nasworthy: Provided on Clinical Summary of Care: 02/08/2018 Form Type Recipient Paper Patient [place] Electronic Signature(s) Signed: 02/08/2018 3:01:38 PM By: Roger Shelter Entered By: Roger Shelter on 02/08/2018 13:28:00 Whelchel, Brantley A. ([id number]) -------------------------------------------------------------------------------- Lower Extremity Assessment Details Patient Name: Heather Dear A. Date of Service: 02/08/2018 12:30 PM Medical Record Number: [id number] Patient Account Number: [id number] Date of Birth/Sex: 1976/01/11 (42 y.o. F) Treating RN: Roger Shelter Primary Care Kashay Cavenaugh: Suzan Garibaldi Other Clinician: Referring Kazzandra Desaulniers: Suzan Garibaldi Treating Krystianna Soth/Extender: Cathie Olden in Treatment: 3 Edema Assessment Assessed: [Left: No] [Right: No] Edema: [Left: Ye] [Right: s] Vascular Assessment Claudication: Claudication Assessment [Left:None] Pulses: Dorsalis Pedis Palpable: [Left:Yes] Posterior Tibial Extremity colors, hair growth, and conditions: Extremity Color: [Left:Normal] Hair Growth on Extremity: [Left:Yes] Temperature of Extremity: [Left:Warm] Capillary Refill: [Left:< 3 seconds] Toe Nail Assessment Left: Right: Thick: No Discolored: No Deformed: No Improper Length and Hygiene: No Electronic Signature(s) Signed: 02/08/2018 3:01:38 PM By: Roger Shelter Entered By: Roger Shelter on 02/08/2018 12:54:27 Hodges, Cipriana A. ([id number]) -------------------------------------------------------------------------------- Multi Wound Chart Details Patient Name: Heather Dear A. Date of Service: 02/08/2018 12:30 PM Medical Record Number: [id number] Patient Account Number: [id number] Date of Birth/Sex: [birth year] (42 y.o.  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] [3:Diabetic Wound/Ulcer of the Lower Extremity] Comorbid History: [1:Chronic Obstructive Pulmonary Disease (COPD), Pulmonary Disease (COPD), Pulmonary Disease (COPD), Congestive Heart Failure, Hypertension, Type II Diabetes, Neuropathy, Seizure Diabetes, Neuropathy, Seizure Diabetes, Neuropathy,  Seizure Disorder] [2:Chronic Obstructive Congestive Heart Failure, Hypertension, Type II Disorder] [3:Chronic Obstructive Congestive Heart Failure, Hypertension, Type II Disorder] Date Acquired: [1:01/01/2018] [2:01/19/2018] [3:01/29/2018] Weeks of Treatment: [1:3] [2:2] [3:1] Wound Status: [1:Open] [2:Open] [3:Open] Pending Amputation on [1:Yes] [2:No] [3:No] Presentation: Measurements L x W x D [1:1.5x1.2x0.3] [2:0.5x1x0.1] [3:1.5x1x0.1] (cm) Area (cm) : [1:1.414] [2:0.393] [3:1.178] Volume (cm) : [1:0.424] [2:0.039] [3:0.118] % Reduction in Area: [1:81.40%] [2:-109.00%] [3:-150.10%] % Reduction in Volume: [1:86.10%] [2:-105.30%] [3:-151.10%] Classification: [1:Grade 2] [2:Partial Thickness] [3:Grade 1] Exudate Amount: [1:Large] [2:Large] [3:None Present] Exudate Type: [1:Serous] [2:Sanguinous] [3:N/A] Exudate Color: [1:amber] [2:red] [3:N/A] Wound Margin: [1:Flat and Intact] [2:Flat and Intact] [3:Flat and Intact] Granulation Amount: [1:Medium (34-66%)]  [2:Large (67-100%)] [3:None Present (0%)] Granulation Quality: [1:Red, Pink] [2:Red] [3:N/A] Necrotic Amount: [1:Medium (34-66%)] [2:Small (1-33%)] [3:Large (67-100%)] Necrotic Tissue: [1:Eschar, Adherent Slough] [2:Adherent Slough] [3:Eschar, Adherent Slough] Exposed Structures: [1:Fat Layer (Subcutaneous Tissue) Exposed: Yes Fascia: No Tendon: No Muscle: No] [2:Fascia: No Fat Layer (Subcutaneous Tissue) Exposed: No Tendon: No Muscle: No] [3:Fascia: No Fat Layer (Subcutaneous Tissue) Exposed: No Tendon: No Muscle: No] Joint: No Joint: No Joint: No Bone: No Bone: No Bone: No Epithelialization: None None None Debridement: Debridement - Excisional Debridement - Excisional Debridement - Excisional Pre-procedure 13:04 13:04 13:04 Verification/Time Out Taken: Pain Control: Lidocaine 4% Topical Solution Lidocaine 4% Topical Solution Lidocaine 4% Topical Solution Tissue Debrided: Subcutaneous, Slough Subcutaneous, Slough Subcutaneous, Slough Level: Skin/Subcutaneous Tissue Skin/Subcutaneous Tissue Skin/Subcutaneous Tissue Debridement Area (sq cm): 1.8 0.5 1.5 Instrument: Curette Curette Curette Bleeding: Minimum Minimum Minimum Hemostasis Achieved: Pressure Pressure Pressure Procedural Pain: 0 0 0 Post Procedural Pain: 0 0 0 Debridement Treatment Procedure was tolerated well Procedure was tolerated well Procedure was tolerated well Response: Post Debridement 1.5x1.2x0.4 0.5x4x0.2 1.5x1x0.2 Measurements L x W x D (cm) Post Debridement Volume: 0.565 0.314 0.236 (cm) Periwound Skin Texture: Excoriation: No Excoriation: No Excoriation: No Induration: No Induration: No Induration: No Callus: No Callus: No Callus: No Crepitus: No Crepitus: No Crepitus: No Rash: No Rash: No Rash: No Scarring: No Scarring: No Scarring: No Periwound Skin Moisture: Maceration: Yes Maceration: No Maceration: Yes Dry/Scaly: No Dry/Scaly: No Dry/Scaly: No Periwound Skin Color: Erythema:  Yes Ecchymosis: Yes Atrophie Blanche: No Atrophie Blanche: No Atrophie Blanche: No Cyanosis: No Cyanosis: No Cyanosis: No Ecchymosis: No Ecchymosis: No Erythema: No Erythema: No Hemosiderin Staining: No Hemosiderin Staining: No Hemosiderin Staining: No Mottled: No Mottled: No Mottled: No Pallor: No Pallor: No Pallor: No Rubor: No Rubor: No Rubor: No Erythema Location: Circumferential N/A N/A Temperature: No Abnormality No Abnormality No Abnormality Tenderness on Palpation: No Yes Yes Wound Preparation: Ulcer Cleansing: Ulcer Cleansing: Ulcer Cleansing: Rinsed/Irrigated with Saline Rinsed/Irrigated with Saline Rinsed/Irrigated with Saline Topical Anesthetic Applied: Topical Anesthetic Applied: Topical Anesthetic Applied: Other: lidocaine 4% Other: lidocaine 4% Other: lidocaine 4% Procedures Performed: Debridement Debridement Debridement Treatment Notes Electronic Signature(s) Signed: 02/08/2018 1:24:07 PM By: Lawanda Cousins Entered By: Lawanda Cousins on 02/08/2018 13:24:06 [name] ([id number]) -------------------------------------------------------------------------------- [place] Details Patient Name: Heather Dear A. Date of Service: 02/08/2018 12:30 PM Medical Record Number: [id number] Patient Account Number: [id number] Date of Birth/Sex: July 10, 1976 (42 y.o. F) Treating RN: Ahmed Prima Primary Care Desirre Eickhoff: Suzan Garibaldi Other Clinician: Referring [name] Schiffman: Suzan Garibaldi Treating Joreen Swearingin/Extender: Cathie Olden in Treatment: 3 Active Inactive ` Abuse / Safety / Falls / Self Care Management Nursing Diagnoses: Potential for falls Goals: Patient will not experience any injury related to falls Date Initiated: 01/18/2018 Target Resolution Date: 04/28/2018 Goal Status: Active Interventions: Assess fall risk on  admission and as needed Assess: immobility, friction, shearing, incontinence upon admission and as needed Assess  impairment of mobility on admission and as needed per policy Assess personal safety and home safety (as indicated) on admission and as needed Assess self care needs on admission and as needed Notes: ` Nutrition Nursing Diagnoses: Imbalanced nutrition Impaired glucose control: actual or potential Potential for alteratiion in Nutrition/Potential for imbalanced nutrition Goals: Patient/caregiver agrees to and verbalizes understanding of need to use nutritional supplements and/or vitamins as prescribed Date Initiated: 01/18/2018 Target Resolution Date: 04/28/2018 Goal Status: Active Patient/caregiver will maintain therapeutic glucose control Date Initiated: 01/18/2018 Target Resolution Date: 04/28/2018 Goal Status: Active Interventions: Assess patient nutrition upon admission and as needed per policy Provide education on elevated blood sugars and impact on wound healing Notes: SHENG, PRITZ ([id number]) Orientation to the Wound Care Program Nursing Diagnoses: Knowledge deficit related to the wound healing center program Goals: Patient/caregiver will verbalize understanding of the [place] Date Initiated: 01/18/2018 Target Resolution Date: 01/27/2018 Goal Status: Active Interventions: Provide education on orientation to the wound center Notes: ` Wound/Skin Impairment Nursing Diagnoses: Impaired tissue integrity Knowledge deficit related to smoking impact on wound healing Knowledge deficit related to ulceration/compromised skin integrity Goals: Ulcer/skin breakdown will have a volume reduction of 80% by week 12 Date Initiated: 01/18/2018 Target Resolution Date: 04/21/2018 Goal Status: Active Interventions: Assess patient/caregiver ability to perform ulcer/skin care regimen upon admission and as needed Assess ulceration(s) every visit Notes: Electronic Signature(s) Signed: 02/08/2018 5:13:37 PM By: Alric Quan Entered By: Alric Quan on 02/08/2018  13:03:37 Mcpheeters, Calea A. ([id number]) -------------------------------------------------------------------------------- Pain Assessment Details Patient Name: Heather Dear A. Date of Service: 02/08/2018 12:30 PM Medical Record Number: [id number] Patient Account Number: [id number] Date of Birth/Sex: 08/04/1976 (42 y.o. F) Treating RN: Roger Shelter Primary Care Mande Auvil: Suzan Garibaldi Other Clinician: Referring Marquetta Weiskopf: Suzan Garibaldi Treating Menachem Urbanek/Extender: Cathie Olden in Treatment: 3 Active Problems Location of Pain Severity and Description of Pain Patient Has Paino No Site Locations Pain Management and Medication Current Pain Management: Electronic Signature(s) Signed: 02/08/2018 3:01:38 PM By: Roger Shelter Entered By: Roger Shelter on 02/08/2018 12:41:19 [name] ([id number]) -------------------------------------------------------------------------------- Patient/Caregiver Education Details Patient Name: Heather Dear A. Date of Service: 02/08/2018 12:30 PM Medical Record Number: [id number] Patient Account Number: [id number] Date of Birth/Gender: 09-Mar-1976 (42 y.o. F) Treating RN: Roger Shelter Primary Care Physician: Suzan Garibaldi Other Clinician: Referring Physician: Suzan Garibaldi Treating Physician/Extender: Cathie Olden in Treatment: 3 Education Assessment Education Provided To: Patient Education Topics Provided Wound Debridement: Handouts: Wound Debridement Methods: Explain/Verbal Responses: State content correctly Wound/Skin Impairment: Handouts: Caring for Your Ulcer Methods: Explain/Verbal Responses: State content correctly Electronic Signature(s) Signed: 02/08/2018 3:01:38 PM By: Roger Shelter Entered By: Roger Shelter on 02/08/2018 13:28:19 Donati, Kamryn A. ([id number]) -------------------------------------------------------------------------------- Wound Assessment Details Patient Name: Heather Dear A. Date  of Service: 02/08/2018 12:30 PM Medical Record Number: [id number] Patient Account Number: [id number] Date of Birth/Sex: 05-24-1976 (42 y.o. F) Treating RN: Roger Shelter Primary Care Antowan Samford: Suzan Garibaldi Other Clinician: Referring Mable Lashley: Suzan Garibaldi Treating Deisha Stull/Extender: Cathie Olden in Treatment: 3 Wound Status Wound Number: 1 Primary Diabetic Wound/Ulcer of the Lower Extremity Etiology: Wound Location: Left Toe Great Wound Open Wounding Event: Gradually Appeared Status: Date Acquired: 01/01/2018 Comorbid Chronic Obstructive Pulmonary Disease Weeks Of Treatment: 3 History: (COPD), Congestive Heart Failure, Clustered Wound: No Hypertension, Type II Diabetes, Neuropathy, Pending Amputation On Presentation Seizure Disorder Photos Photo Uploaded By: Roger Shelter on 02/09/2018 16:15:50 Wound Measurements Length: (cm)  1.5 Width: (cm) 1.2 Depth: (cm) 0.3 Area: (cm) 1.414 Volume: (cm) 0.424 % Reduction in Area: 81.4% % Reduction in Volume: 86.1% Epithelialization: None Tunneling: No Undermining: No Wound Description Classification: Grade 2 Wound Margin: Flat and Intact Exudate Amount: Large Exudate Type: Serous Exudate Color: amber Foul Odor After Cleansing: No Slough/Fibrino Yes Wound Bed Granulation Amount: Medium (34-66%) Exposed Structure Granulation Quality: Red, Pink Fascia Exposed: No Necrotic Amount: Medium (34-66%) Fat Layer (Subcutaneous Tissue) Exposed: Yes Necrotic Quality: Eschar, Adherent Slough Tendon Exposed: No Muscle Exposed: No Joint Exposed: No Bone Exposed: No Mally, Mahsa A. ([id number]) Periwound Skin Texture Texture Color No Abnormalities Noted: No No Abnormalities Noted: No Callus: No Atrophie Blanche: No Crepitus: No Cyanosis: No Excoriation: No Ecchymosis: No Induration: No Erythema: Yes Rash: No Erythema Location: Circumferential Scarring: No Hemosiderin Staining: No Mottled:  No Moisture Pallor: No No Abnormalities Noted: No Rubor: No Dry / Scaly: No Maceration: Yes Temperature / Pain Temperature: No Abnormality Wound Preparation Ulcer Cleansing: Rinsed/Irrigated with Saline Topical Anesthetic Applied: Other: lidocaine 4%, Treatment Notes Wound #1 (Left Toe Great) 1. Cleansed with: Clean wound with Normal Saline 2. Anesthetic Topical Lidocaine 4% cream to wound bed prior to debridement 4. Dressing Applied: Hydrafera Blue 5. Secondary Capron to abdomen light conform wrap and secure with tape on top of foot and dorsal on foot . do not wrap tape around toe. Electronic Signature(s) Signed: 02/08/2018 3:01:38 PM By: Roger Shelter Entered By: Roger Shelter on 02/08/2018 12:51:58 [name] ([id number]) -------------------------------------------------------------------------------- Wound Assessment Details Patient Name: Heather Dear A. Date of Service: 02/08/2018 12:30 PM Medical Record Number: [id number] Patient Account Number: [id number] Date of Birth/Sex: Jul 09, 1976 (42 y.o. F) Treating RN: Roger Shelter Primary Care Camiah Humm: Suzan Garibaldi Other Clinician: Referring Madyx Delfin: Suzan Garibaldi Treating Dorrell Mitcheltree/Extender: Cathie Olden in Treatment: 3 Wound Status Wound Number: 2 Primary Cyst Etiology: Wound Location: Abdomen - Lower Quadrant - Midline Wound Open Wounding Event: Pimple Status: Date Acquired: 01/19/2018 Comorbid Chronic Obstructive Pulmonary Disease Weeks Of Treatment: 2 History: (COPD), Congestive Heart Failure, Clustered Wound: No Hypertension, Type II Diabetes, Neuropathy, Seizure Disorder Photos Photo Uploaded By: Roger Shelter on 02/08/2018 15:06:49 Wound Measurements Length: (cm) 0.5 Width: (cm) 1 Depth: (cm) 0.1 Area: (cm) 0.393 Volume: (cm) 0.039 % Reduction in Area: -109% % Reduction in Volume: -105.3% Epithelialization: None Tunneling:  No Undermining: No Wound Description Classification: Partial Thickness Wound Margin: Flat and Intact Exudate Amount: Large Exudate Type: Sanguinous Exudate Color: red Foul Odor After Cleansing: No Slough/Fibrino Yes Wound Bed Granulation Amount: Large (67-100%) Exposed Structure Granulation Quality: Red Fascia Exposed: No Necrotic Amount: Small (1-33%) Fat Layer (Subcutaneous Tissue) Exposed: No Necrotic Quality: Adherent Slough Tendon Exposed: No Muscle Exposed: No Joint Exposed: No Bone Exposed: No Dunckel, Chalet A. ([id number]) Periwound Skin Texture Texture Color No Abnormalities Noted: No No Abnormalities Noted: No Callus: No Atrophie Blanche: No Crepitus: No Cyanosis: No Excoriation: No Ecchymosis: Yes Induration: No Erythema: No Rash: No Hemosiderin Staining: No Scarring: No Mottled: No Pallor: No Moisture Rubor: No No Abnormalities Noted: No Dry / Scaly: No Temperature / Pain Maceration: No Temperature: No Abnormality Tenderness on Palpation: Yes Wound Preparation Ulcer Cleansing: Rinsed/Irrigated with Saline Topical Anesthetic Applied: Other: lidocaine 4%, Treatment Notes Wound #2 (Midline Abdomen - Lower Quadrant) 1. Cleansed with: Clean wound with Normal Saline 2. Anesthetic Topical Lidocaine 4% cream to wound bed prior to debridement 4. Dressing Applied: Hydrafera Blue 5. Secondary [place] to abdomen  light conform wrap and secure with tape on top of foot and dorsal on foot . do not wrap tape around toe. Electronic Signature(s) Signed: 02/08/2018 3:01:38 PM By: Roger Shelter Entered By: Roger Shelter on 02/08/2018 12:53:32 [name] ([id number]) -------------------------------------------------------------------------------- Wound Assessment Details Patient Name: Heather Dear A. Date of Service: 02/08/2018 12:30 PM Medical Record Number: [id number] Patient Account Number:  [id number] Date of Birth/Sex: 24-Nov-1975 (42 y.o. F) Treating RN: Roger Shelter Primary Care Jemario Poitras: Suzan Garibaldi Other Clinician: Referring Ayannah Faddis: Suzan Garibaldi Treating Shaquia Berkley/Extender: Cathie Olden in Treatment: 3 Wound Status Wound Number: 3 Primary Diabetic Wound/Ulcer of the Lower Extremity Etiology: Wound Location: Left Toe Great - Lateral Wound Open Wounding Event: Trauma Status: Date Acquired: 01/29/2018 Comorbid Chronic Obstructive Pulmonary Disease Weeks Of Treatment: 1 History: (COPD), Congestive Heart Failure, Clustered Wound: No Hypertension, Type II Diabetes, Neuropathy, Seizure Disorder Photos Photo Uploaded By: Roger Shelter on 02/08/2018 15:07:31 Wound Measurements Length: (cm) 1.5 Width: (cm) 1 Depth: (cm) 0.1 Area: (cm) 1.178 Volume: (cm) 0.118 % Reduction in Area: -150.1% % Reduction in Volume: -151.1% Epithelialization: None Tunneling: No Undermining: No Wound Description Classification: Grade 1 Wound Margin: Flat and Intact Exudate Amount: None Present Foul Odor After Cleansing: No Slough/Fibrino Yes Wound Bed Granulation Amount: None Present (0%) Exposed Structure Necrotic Amount: Large (67-100%) Fascia Exposed: No Necrotic Quality: Eschar, Adherent Slough Fat Layer (Subcutaneous Tissue) Exposed: No Tendon Exposed: No Muscle Exposed: No Joint Exposed: No Bone Exposed: No Periwound Skin Texture Texture Color Broder, Kenda A. ([id number]) No Abnormalities Noted: No No Abnormalities Noted: No Callus: No Atrophie Blanche: No Crepitus: No Cyanosis: No Excoriation: No Ecchymosis: No Induration: No Erythema: No Rash: No Hemosiderin Staining: No Scarring: No Mottled: No Pallor: No Moisture Rubor: No No Abnormalities Noted: No Dry / Scaly: No Temperature / Pain Maceration: Yes Temperature: No Abnormality Tenderness on Palpation: Yes Wound Preparation Ulcer Cleansing: Rinsed/Irrigated with Saline Topical  Anesthetic Applied: Other: lidocaine 4%, Treatment Notes Wound #3 (Left, Lateral Toe Great) 1. Cleansed with: Clean wound with Normal Saline 2. Anesthetic Topical Lidocaine 4% cream to wound bed prior to debridement 4. Dressing Applied: Hydrafera Blue 5. Secondary [place] to abdomen light conform wrap and secure with tape on top of foot and dorsal on foot . do not wrap tape around toe. Electronic Signature(s) Signed: 02/08/2018 3:01:38 PM By: Roger Shelter Entered By: Roger Shelter on 02/08/2018 12:54:01 [name] ([id number]) -------------------------------------------------------------------------------- Vitals Details Patient Name: Heather Dear A. Date of Service: 02/08/2018 12:30 PM Medical Record Number: [id number] Patient Account Number: [id number] Date of Birth/Sex: 10-29-1975 (42 y.o. F) Treating RN: Roger Shelter Primary Care Makail Watling: Suzan Garibaldi Other Clinician: Referring Ola Raap: Suzan Garibaldi Treating Dhaval Woo/Extender: Cathie Olden in Treatment: 3 Vital Signs Time Taken: 12:40 Temperature (F): 98.2 Height (in): 69 Pulse (bpm): 130 Weight (lbs): 250 Respiratory Rate (breaths/min): 18 Body Mass Index (BMI): 36.9 Blood Pressure (mmHg): 146/86 Reference Range: 80 - 120 mg / dl Electronic Signature(s) Signed: 02/08/2018 3:01:38 PM By: Roger Shelter Entered By: Roger Shelter on 02/08/2018 12:43:24

## 2018-02-13 NOTE — Progress Notes (Signed)
LIZVETTE, LIGHTSEY ([id number]) Visit Report for 02/08/2018 Chief Complaint Document Details Patient Name: [name], [name] Date of Service: 02/08/2018 12:30 PM Medical Record Number: [id number] Patient Account Number: [id number] Date of Birth/Sex: 1976/01/17 (42 y.o. F) Treating RN: Ahmed Prima Primary Care Provider: Suzan Garibaldi Other Clinician: Referring Provider: Suzan Garibaldi Treating Provider/Extender: Cathie Olden in Treatment: 3 Information Obtained from: Patient Chief Complaint She is here for evaluation of a left great toe ulcer Electronic Signature(s) Signed: 02/08/2018 2:23:13 PM By: Lawanda Cousins Entered By: Lawanda Cousins on 02/08/2018 14:23:12 [name] ([id number]) -------------------------------------------------------------------------------- Debridement Details Patient Name: Heather Dear A. Date of Service: 02/08/2018 12:30 PM Medical Record Number: [id number] Patient Account Number: [id number] Date of Birth/Sex: 1976-05-29 (42 y.o. F) Treating RN: Ahmed Prima Primary Care Provider: Suzan Garibaldi Other Clinician: Referring Provider: Suzan Garibaldi Treating Provider/Extender: Cathie Olden in Treatment: 3 Debridement Performed for Wound #2 Midline Abdomen - Lower Quadrant Assessment: Performed By: Physician Lawanda Cousins, NP Debridement Type: Debridement Pre-procedure Verification/Time Yes - 13:04 Out Taken: Start Time: 13:04 Pain Control: Lidocaine 4% Topical Solution Total Area Debrided (L x W): 0.5 (cm) x 1 (cm) = 0.5 (cm) Tissue and other material Viable, Non-Viable, Slough, Subcutaneous, Fibrin/Exudate, Slough debrided: Level: Skin/Subcutaneous Tissue Debridement Description: Excisional Instrument: Curette Bleeding: Minimum Hemostasis Achieved: Pressure End Time: 13:05 Procedural Pain: 0 Post Procedural Pain: 0 Response to Treatment: Procedure was tolerated well Post Debridement Measurements of Total Wound Length: (cm)  0.5 Width: (cm) 4 Depth: (cm) 0.2 Volume: (cm) 0.314 Character of Wound/Ulcer Post Debridement: Requires Further Debridement Post Procedure Diagnosis Same as Pre-procedure Electronic Signature(s) Signed: 02/08/2018 2:21:21 PM By: Lawanda Cousins Signed: 02/08/2018 5:13:37 PM By: Alric Quan Entered By: Lawanda Cousins on 02/08/2018 14:21:21 [name], [name] ([id number]) -------------------------------------------------------------------------------- Debridement Details Patient Name: Heather Dear A. Date of Service: 02/08/2018 12:30 PM Medical Record Number: [id number] Patient Account Number: [id number] Date of Birth/Sex: 1975/12/18 (43 y.o. F) Treating RN: Ahmed Prima Primary Care Provider: Suzan Garibaldi Other Clinician: Referring Provider: Suzan Garibaldi Treating Provider/Extender: Cathie Olden in Treatment: 3 Debridement Performed for Wound #1 Left Toe Great Assessment: Performed By: Physician Lawanda Cousins, NP Debridement Type: Debridement Severity of Tissue Pre Fat layer exposed Debridement: Pre-procedure Verification/Time Yes - 13:04 Out Taken: Start Time: 13:06 Pain Control: Lidocaine 4% Topical Solution Total Area Debrided (L x W): 1.5 (cm) x 1.2 (cm) = 1.8 (cm) Tissue and other material Viable, Non-Viable, Slough, Subcutaneous, Fibrin/Exudate, Slough debrided: Level: Skin/Subcutaneous Tissue Debridement Description: Excisional Instrument: Curette Bleeding: Minimum Hemostasis Achieved: Pressure End Time: 13:10 Procedural Pain: 0 Post Procedural Pain: 0 Response to Treatment: Procedure was tolerated well Post Debridement Measurements of Total Wound Length: (cm) 1.5 Width: (cm) 1.2 Depth: (cm) 0.4 Volume: (cm) 0.565 Character of Wound/Ulcer Post Debridement: Requires Further Debridement Severity of Tissue Post Debridement: Fat layer exposed Post Procedure Diagnosis Same as Pre-procedure Electronic Signature(s) Signed: 02/08/2018 2:21:49 PM  By: Lawanda Cousins Signed: 02/08/2018 5:13:37 PM By: Alric Quan Entered By: Lawanda Cousins on 02/08/2018 14:21:49 Ratledge, [name] ([id number]) -------------------------------------------------------------------------------- HPI Details Patient Name: Heather Dear A. Date of Service: 02/08/2018 12:30 PM Medical Record Number: [id number] Patient Account Number: [id number] Date of Birth/Sex: [birth year] (42 y.o. F) Treating RN: Ahmed Prima Primary Care Provider: Suzan Garibaldi Other Clinician: Referring Provider: Suzan Garibaldi Treating Provider/Extender: Cathie Olden in Treatment: 3 History of Present Illness HPI Description: 01/18/18-She is here for initial evaluation of the left great toe ulcer. She is a poor historian in regards to timeframe in detail. She  states approximately 4 weeks ago she lacerated her toe on something in the house. She followed up with her primary care who placed her on Bactrim and ultimately a second dose of Bactrim prior to coming to wound clinic. She states she has been treating the toe with peroxide, Betadine and a Band-Aid. She did not check her blood sugar this morning but checked it yesterday morning it was 327; she is unaware of a recent A1c and there are no current records. She saw Dr. she would've orthopedics last week for an old injury to the left ankle, she states he did not see her toe, nor did she bring it to his attention. She smokes approximately 1 pack cigarettes a day. Her social situation is concerning, she arrives this morning with her mother who appears extremely intoxicated/under the influence; her mother was asked to leave the room and be monitored by the patient's grandmother. The patient's aunt then accompanied the patient and the room throughout the rest of the appointment. We had a lengthy discussion regarding the deleterious effects of uncontrolled hyperglycemia and smoking as it relates to wound healing and overall health. She was  strongly encouraged to decrease her smoking and get her diabetes under better control. She states she is currently on a diet and has cut down her [facility] consumption. The left toe is erythematous, macerated and slightly edematous with malodor present. The edema in her left foot is below her baseline, there is no erythema streaking. We will treat her with Santyl, doxycycline; we have ordered and xray, culture and provided a Peg assist surgical shoe and cultured the wound. 01/25/18-She is here in follow-up evaluation for a left great toe ulcer and presents with an abscess to her suprapubic area. She states her blood sugars remain elevated, feeling "sick" and if levels are below 250, but she is trying. She has made no attempt to decrease her smoking stating that we "can't take away her food in her cigarettes". She has been compliant with offloading using the PEG assist you. She is using Santyl daily. the culture obtained last week grew staph aureus and Enterococcus faecalis; continues on the doxycycline and Augmentin was added on Monday. The suprapubic area has erythema, no femoral variation, purple discoloration, minimal induration, was accessed with a cotton tip applicator with sanguinopurulent drainage, this was cultured, I suspect the current antibiotic treatment will cover and we will not add anything to her current treatment plan. She was advised to go to urgent care or ER with any change in redness, induration or fever. 02/01/18-She is here in follow-up evaluation for left great toe ulcers and a new abdominal abscess from last week. She was able to use packing until earlier this week, where she "forgot it was there". She states she was feeling ill with GI symptoms last week and was not taking her antibiotic. She states her glucose levels have been predominantly less than 200, with occasional levels between 200-250. She thinks this was contributing to her GI symptoms as they have resolved  without intervention. There continues to be significant laceration to left toe, otherwise it clinically looks stable/improved. There is now less superficial opening to the lateral aspect of the great toe that was residual blister. We will transition to [facility] to all wounds, she will continue her Augmentin. If there is no change or deterioration next week for reculture. 02/08/18-She is here in follow-up evaluation for left great toe ulcer and abdominal ulcer. There is an improvement in both wounds. She  has been wrapping her left toe with coban, not by our direction, which has created an area of discoloration to the medial aspect; she has been advised to NOT use coban secondary to her neuropathy. She states her glucose levels have been high over this last week ranging from 200-350, she continues to smoke. She admits to being less compliant with her offloading shoe. We will continue with same treatment plan and she will follow-up next week. Electronic Signature(s) Signed: 02/08/2018 2:26:16 PM By: Lawanda Cousins Entered By: Lawanda Cousins on 02/08/2018 14:26:15 [name] ([id number]) -------------------------------------------------------------------------------- Physician Orders Details Patient Name: Heather Dear A. Date of Service: 02/08/2018 12:30 PM Medical Record Number: [id number] Patient Account Number: [id number] Date of Birth/Sex: 06-25-1976 (42 y.o. F) Treating RN: Ahmed Prima Primary Care Provider: Suzan Garibaldi Other Clinician: Referring Provider: Suzan Garibaldi Treating Provider/Extender: Cathie Olden in Treatment: 3 Verbal / Phone Orders: Yes Clinician: Pinkerton, Debi Read Back and Verified: Yes Diagnosis Coding Wound Cleansing Wound #1 Left Toe Great o Clean wound with Normal Saline. o Cleanse wound with mild soap and water Wound #2 Midline Abdomen - Lower Quadrant o Clean wound with Normal Saline. o Cleanse wound with mild soap and  water Wound #3 Left,Lateral Toe Great o Clean wound with Normal Saline. o Cleanse wound with mild soap and water Anesthetic (add to Medication List) Wound #1 Left Toe Great o Topical Lidocaine 4% cream applied to wound bed prior to debridement (In Clinic Only). Wound #2 Midline Abdomen - Lower Quadrant o Topical Lidocaine 4% cream applied to wound bed prior to debridement (In Clinic Only). Wound #3 Left,Lateral Toe Great o Topical Lidocaine 4% cream applied to wound bed prior to debridement (In Clinic Only). Skin Barriers/Peri-Wound Care Wound #2 Midline Abdomen - Lower Quadrant o Skin Prep Primary Wound Dressing Wound #1 Left Toe Great o Hydrafera Blue Ready Transfer Wound #2 Midline Abdomen - Lower Quadrant o Hydrafera Blue Ready Transfer Wound #3 Left,Lateral Toe Great o Hydrafera Blue Ready Transfer Secondary Dressing Wound #1 Left Toe Great o Dry Gauze o Conform/Kerlix Wound #2 Midline Abdomen - Lower Quadrant Coste, Acsa A. ([id number]) o [place] Wound #3 Left,Lateral Toe Great o Dry Gauze o Conform/Kerlix Dressing Change Frequency Wound #1 Left Toe Great o Change dressing every day. Wound #2 Midline Abdomen - Lower Quadrant o Change dressing every other day. Wound #3 Left,Lateral Toe Great o Change dressing every other day. Follow-up Appointments Wound #1 Left Toe Great o Return Appointment in 1 week. Wound #2 Midline Abdomen - Lower Quadrant o Return Appointment in 1 week. Wound #3 Left,Lateral Toe Great o Return Appointment in 1 week. Edema Control Wound #1 Left Toe Great o Patient to wear own compression stockings o Elevate legs to the level of the heart and pump ankles as often as possible Wound #3 Left,Lateral Toe Great o Patient to wear own compression stockings o Elevate legs to the level of the heart and pump ankles as often as possible Off-Loading Wound #1 Left Toe Great o Open toe surgical  shoe with peg assist. Wound #3 Left,Lateral Toe Great o Open toe surgical shoe with peg assist. Additional Orders / Instructions Wound #1 Left Toe Great o Stop Smoking o Increase protein intake. Wound #2 Midline Abdomen - Lower Quadrant o Stop Smoking o Increase protein intake. Wound #3 Left,Lateral Toe Great o Stop Smoking o Increase protein intake. ELLIANAH, CORDY A. ([id number]) Patient Medications Allergies: erythromycin base Notifications Medication Indication Start End lidocaine DOSE 1 - topical 4 %  cream - 1 cream topical Electronic Signature(s) Signed: 02/08/2018 3:41:30 PM By: Lawanda Cousins Signed: 02/08/2018 5:13:37 PM By: Alric Quan Entered By: Alric Quan on 02/08/2018 13:18:52 AERILYN, SLEE AMarland Kitchen ([id number]) -------------------------------------------------------------------------------- Prescription 02/08/2018 Patient Name: Heather Dear A. Provider: Lawanda Cousins NP Date of Birth: [birth year] NPI#: [id number] Sex: F DEA#: [id number] Phone #: [phone] License #: Patient Address: [facility] [place], [place] [address] [address], [place], [place] [address] [phone] Allergies erythromycin base Medication Medication: Route: Strength: Form: lidocaine topical 4% cream Class: TOPICAL LOCAL ANESTHETICS Dose: Frequency / Time: Indication: 1 1 cream topical Number of Refills: Number of Units: 0 Generic Substitution: Start Date: End Date: Administered at Substitution Permitted Facility: Yes Time Administered: Time Discontinued: Note to Pharmacy: Signature(s): Date(s): Electronic Signature(s) Signed: 02/08/2018 3:41:30 PM By: Lawanda Cousins Signed: 02/08/2018 5:13:37 PM By: Alric Quan Entered By: Alric Quan on 02/08/2018 13:18:53 [name] ([id number]) [name], Julanne A.  ([id number]) --------------------------------------------------------------------------------  Problem List Details Patient Name: Heather Dear A. Date of Service: 02/08/2018 12:30 PM Medical Record Number: [id number] Patient Account Number: [id number] Date of Birth/Sex: 1976-08-28 (42 y.o. 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 ([id number]) -------------------------------------------------------------------------------- Progress Note Details Patient Name: Heather Dear A. Date of Service: 02/08/2018 12:30 PM Medical Record Number: [id number] Patient Account Number: [id number] Date of Birth/Sex: July 10, 1976 (42 y.o. F) Treating RN: Ahmed Prima Primary Care Provider: Suzan Garibaldi Other Clinician: Referring Provider: Suzan Garibaldi Treating Provider/Extender: Cathie Olden in Treatment: 3 Subjective Chief Complaint Information obtained from Patient She is here for evaluation of a left great toe ulcer History of Present Illness (HPI) 01/18/18-She is here for initial evaluation of the left great toe ulcer. She is a poor historian in regards to timeframe in detail. She states approximately 4 weeks ago she lacerated her toe on something in the house. She followed up with her primary care who placed her on  Bactrim and ultimately a second dose of Bactrim prior to coming to wound clinic. She states she has been treating the toe with peroxide, Betadine and a Band-Aid. She did not check her blood sugar this morning but checked it yesterday morning it was 327; she is unaware of a recent A1c and there are no current records. She saw Dr. she would've orthopedics last week for an old injury to the left ankle, she states he did not see her toe, nor did she bring it to his attention. She smokes approximately 1 pack cigarettes a day. Her social situation is concerning, she arrives this morning with her mother who appears extremely intoxicated/under the influence; her mother was asked to leave the room and be monitored by the patient's grandmother. The patient's aunt then accompanied the patient and the room throughout the rest of the appointment. We had a lengthy discussion regarding the deleterious effects of uncontrolled hyperglycemia and smoking as it relates to wound healing and overall health. She  was strongly encouraged to decrease her smoking and get her diabetes under better control. She states she is currently on a diet and has cut down her [facility] consumption. The left toe is erythematous, macerated and slightly edematous with malodor present. The edema in her left foot is below her baseline, there is no erythema streaking. We will treat her with Santyl, doxycycline; we have ordered and xray, culture and provided a Peg assist surgical shoe and cultured the wound. 01/25/18-She is here in follow-up evaluation for a left great toe ulcer and presents with an abscess to her suprapubic area. She states her blood sugars remain elevated, feeling "sick" and if levels are below 250, but she is trying. She has made no attempt to decrease her smoking stating that we "can't take away her food in her cigarettes". She has been compliant with offloading using the PEG assist you. She is using Santyl daily. the  culture obtained last week grew staph aureus and Enterococcus faecalis; continues on the doxycycline and Augmentin was added on Monday. The suprapubic area has erythema, no femoral variation, purple discoloration, minimal induration, was accessed with a cotton tip applicator with sanguinopurulent drainage, this was cultured, I suspect the current antibiotic treatment will cover and we will not add anything to her current treatment plan. She was advised to go to urgent care or ER with any change in redness, induration or fever. 02/01/18-She is here in follow-up evaluation for left great toe ulcers and a new abdominal abscess from last week. She was able to use packing until earlier this week, where she "forgot it was there". She states she was feeling ill with GI symptoms last week and was not taking her antibiotic. She states her glucose levels have been predominantly less than 200, with occasional levels between 200-250. She thinks this was contributing to her GI symptoms as they have resolved without intervention. There continues to be significant laceration to left toe, otherwise it clinically looks stable/improved. There is now less superficial opening to the lateral aspect of the great toe that was residual blister. We will transition to [facility] to all wounds, she will continue her Augmentin. If there is no change or deterioration next week for reculture. 02/08/18-She is here in follow-up evaluation for left great toe ulcer and abdominal ulcer. There is an improvement in both wounds. She has been wrapping her left toe with coban, not by our direction, which has created an area of discoloration to the medial aspect; she has been advised to NOT use coban secondary to her neuropathy. She states her glucose levels have been high over this last week ranging from 200-350, she continues to smoke. She admits to being less compliant with her offloading shoe. We will continue with same treatment plan  and she will follow-up next week. Patient History Information obtained from Patient. Family History MACLOVIA, UHER ([id number]) Cancer - Paternal Grandparents, Heart Disease - Paternal Grandparents, Hypertension - Paternal Grandparents, No family history of Diabetes, Hereditary Spherocytosis, Kidney Disease, Lung Disease, Seizures, Stroke, Thyroid Problems, Tuberculosis. Social History Current every day smoker, Marital Status - Single, Alcohol Use - Never, Drug Use - Prior History, Caffeine Use - Daily. Objective Constitutional Vitals Time Taken: 12:40 PM, Height: 69 in, Weight: 250 lbs, BMI: 36.9, Temperature: 98.2 F, Pulse: 130 bpm, Respiratory Rate: 18 breaths/min, Blood Pressure: 146/86 mmHg. Integumentary (Hair, Skin) Wound #1 status is Open. Original cause of wound was Gradually Appeared. The wound is located on the Left Toe Great. The wound  measures 1.5cm length x 1.2cm width x 0.3cm depth; 1.414cm^2 area and 0.424cm^3 volume. There is Fat Layer (Subcutaneous Tissue) Exposed exposed. There is no tunneling or undermining noted. There is a large amount of serous drainage noted. The wound margin is flat and intact. There is medium (34-66%) red, pink granulation within the wound bed. There is a medium (34-66%) amount of necrotic tissue within the wound bed including Eschar and Adherent Slough. The periwound skin appearance exhibited: Maceration, Erythema. The periwound skin appearance did not exhibit: Callus, Crepitus, Excoriation, Induration, Rash, Scarring, Dry/Scaly, Atrophie Blanche, Cyanosis, Ecchymosis, Hemosiderin Staining, Mottled, Pallor, Rubor. The surrounding wound skin color is noted with erythema which is circumferential. Periwound temperature was noted as No Abnormality. Wound #2 status is Open. Original cause of wound was Pimple. The wound is located on the Midline Abdomen - Lower Quadrant. The wound measures 0.5cm length x 1cm width x 0.1cm depth; 0.393cm^2 area and  0.039cm^3 volume. There is no tunneling or undermining noted. There is a large amount of sanguinous drainage noted. The wound margin is flat and intact. There is large (67-100%) red granulation within the wound bed. There is a small (1-33%) amount of necrotic tissue within the wound bed including Adherent Slough. The periwound skin appearance exhibited: Ecchymosis. The periwound skin appearance did not exhibit: Callus, Crepitus, Excoriation, Induration, Rash, Scarring, Dry/Scaly, Maceration, Atrophie Blanche, Cyanosis, Hemosiderin Staining, Mottled, Pallor, Rubor, Erythema. Periwound temperature was noted as No Abnormality. The periwound has tenderness on palpation. Wound #3 status is Open. Original cause of wound was Trauma. The wound is located on the [employer]. The wound measures 1.5cm length x 1cm width x 0.1cm depth; 1.178cm^2 area and 0.118cm^3 volume. There is no tunneling or undermining noted. There is a none present amount of drainage noted. The wound margin is flat and intact. There is no granulation within the wound bed. There is a large (67-100%) amount of necrotic tissue within the wound bed including Eschar and Adherent Slough. The periwound skin appearance exhibited: Maceration. The periwound skin appearance did not exhibit: Callus, Crepitus, Excoriation, Induration, Rash, Scarring, Dry/Scaly, Atrophie Blanche, Cyanosis, Ecchymosis, Hemosiderin Staining, Mottled, Pallor, Rubor, Erythema. Periwound temperature was noted as No Abnormality. The periwound has tenderness on palpation. Assessment Ortloff, Clista A. 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 There was a Excisional Skin/Subcutaneous Tissue Debridement with a total area of 1.8 sq cm performed by Lawanda Cousins, NP. With the following instrument(s): Curette. to remove Viable and Non-Viable tissue/material Material removed includes Subcutaneous Tissue, and Slough, Fibrin/Exudate, and [place] after achieving pain control using Lidocaine 4% Topical Solution. No specimens were taken. A time out was conducted at 13:04, prior to the start of the procedure. A Minimum amount of bleeding was controlled with Pressure. The procedure was tolerated well with a pain level of 0 throughout and a pain level of 0 following the procedure. Post Debridement Measurements: 1.5cm length x 1.2cm width x 0.4cm depth; 0.565cm^3 volume. Character of Wound/Ulcer Post Debridement requires further debridement. Severity of Tissue Post Debridement is: Fat layer exposed. Post procedure Diagnosis Wound #1: Same as Pre-Procedure Wound #2 Pre-procedure diagnosis of Wound #  2 is a Cyst located on the Midline Abdomen - Lower Quadrant . There was a Excisional Skin/Subcutaneous Tissue Debridement with a total area of 0.5 sq cm performed by Lawanda Cousins, NP. With the following instrument(s): Curette. to remove Viable and Non-Viable tissue/material Material removed includes Subcutaneous Tissue, and Slough, Fibrin/Exudate, and [place] after achieving pain control using Lidocaine 4% Topical Solution. No specimens were taken. A time out was conducted at 13:04, prior to the start of the procedure. A Minimum amount of bleeding was controlled with Pressure. The procedure was tolerated well with a pain level of 0 throughout and a pain level of 0 following the procedure. Post Debridement Measurements: 0.5cm length x 4cm width x 0.2cm depth; 0.314cm^3 volume. Character of Wound/Ulcer Post Debridement requires further debridement. Post procedure  Diagnosis Wound #2: Same as Pre-Procedure Plan Wound Cleansing: Wound #1 Left Toe Great: Clean wound with Normal Saline. Cleanse wound with mild soap and water Wound #2 Midline Abdomen - Lower Quadrant: Clean wound with Normal Saline. Cleanse wound with mild soap and water Wound #3 Left,Lateral Toe Great: Clean wound with Normal Saline. Cleanse wound with mild soap and water Anesthetic (add to Medication List): JOVAN, SCHICKLING A. ([id number]) Wound #1 Left Toe Great: Topical Lidocaine 4% cream applied to wound bed prior to debridement (In Clinic Only). Wound #2 Midline Abdomen - Lower Quadrant: Topical Lidocaine 4% cream applied to wound bed prior to debridement (In Clinic Only). Wound #3 Left,Lateral Toe Great: Topical Lidocaine 4% cream applied to wound bed prior to debridement (In Clinic Only). Skin Barriers/Peri-Wound Care: Wound #2 Midline Abdomen - Lower Quadrant: Skin Prep Primary Wound Dressing: Wound #1 Left Toe Great: Hydrafera Blue Ready Transfer Wound #2 Midline Abdomen - Lower Quadrant: Hydrafera Blue Ready Transfer Wound #3 Left,Lateral Toe Great: Hydrafera Blue Ready Transfer Secondary Dressing: Wound #1 Left Toe Great: Dry Gauze Conform/Kerlix Wound #2 Midline Abdomen - Lower Quadrant: Tignall Wound #3 Left,Lateral Toe Great: Dry Gauze Conform/Kerlix Dressing Change Frequency: Wound #1 Left Toe Great: Change dressing every day. Wound #2 Midline Abdomen - Lower Quadrant: Change dressing every other day. Wound #3 Left,Lateral Toe Great: Change dressing every other day. Follow-up Appointments: Wound #1 Left Toe Great: Return Appointment in 1 week. Wound #2 Midline Abdomen - Lower Quadrant: Return Appointment in 1 week. Wound #3 Left,Lateral Toe Great: Return Appointment in 1 week. Edema Control: Wound #1 Left Toe Great: Patient to wear own compression stockings Elevate legs to the level of the heart and pump ankles as often as possible Wound #3  Left,Lateral Toe Great: Patient to wear own compression stockings Elevate legs to the level of the heart and pump ankles as often as possible Off-Loading: Wound #1 Left Toe Great: Open toe surgical shoe with peg assist. Wound #3 Left,Lateral Toe Great: Open toe surgical shoe with peg assist. Additional Orders / Instructions: Wound #1 Left Toe Great: Stop Smoking Increase protein intake. Wound #2 Midline Abdomen - Lower Quadrant: Stop Smoking Increase protein intake. Wound #3 Left,Lateral Toe Great: Stop Smoking Dambach, Alexine A. ([id number]) Increase protein intake. The following medication(s) was prescribed: lidocaine topical 4 % cream 1 1 cream topical was prescribed at facility Electronic Signature(s) Signed: 02/08/2018 2:26:39 PM By: Lawanda Cousins Entered By: Lawanda Cousins on 02/08/2018 14:26:39 [name] ([id number]) -------------------------------------------------------------------------------- ROS/PFSH Details Patient Name: Heather Dear A. Date of Service: 02/08/2018 12:30 PM Medical Record Number: [id number] Patient Account Number: [id number] Date of Birth/Sex: [birth year] (42 y.o. F) Treating RN: Ahmed Prima Primary Care Provider: Suzan Garibaldi  Other Clinician: Referring Provider: Suzan Garibaldi Treating Provider/Extender: Cathie Olden in Treatment: 3 Information Obtained From Patient Wound History Do you currently have one or more open woundso Yes How many open wounds do you currently haveo 1 Approximately how long have you had your woundso 1 month How have you been treating your wound(s) until nowo open to air Has your wound(s) ever healed and then re-openedo No Have you had any lab work done in the past montho No Have you tested positive for an antibiotic resistant organism (MRSA, VRE)o No Have you tested positive for osteomyelitis (bone infection)o No Have you had any tests for circulation on your legso No Hematologic/Lymphatic Medical  History: Negative for: Anemia; Hemophilia; Human Immunodeficiency Virus; Lymphedema; Sickle Cell Disease Respiratory Medical History: Positive for: Chronic Obstructive Pulmonary Disease (COPD) Negative for: Aspiration; Asthma; Pneumothorax; Sleep Apnea; Tuberculosis Cardiovascular Medical History: Positive for: Congestive Heart Failure; Hypertension Negative for: Angina; Arrhythmia; Coronary Artery Disease; Deep Vein Thrombosis; Hypotension; Myocardial Infarction; Peripheral Arterial Disease; Peripheral Venous Disease; Phlebitis; Vasculitis Gastrointestinal Medical History: Negative for: Cirrhosis ; Colitis; Crohnos; Hepatitis A; Hepatitis B; Hepatitis C Endocrine Medical History: Positive for: Type II Diabetes Treated with: Insulin, Oral agents Genitourinary Medical History: Negative for: End Stage Renal Disease Immunological BREEA, LONCAR A. ([id number]) Medical History: Negative for: Lupus Erythematosus; Raynaudos; Scleroderma Musculoskeletal Medical History: Negative for: Gout; Rheumatoid Arthritis; Osteoarthritis; Osteomyelitis Neurologic Medical History: Positive for: Neuropathy; Seizure Disorder Oncologic Medical History: Negative for: Received Chemotherapy; Received Radiation Immunizations Pneumococcal Vaccine: Received Pneumococcal Vaccination: Yes Immunization Notes: up to date Implantable Devices Family and Social History Cancer: Yes - Paternal Grandparents; Diabetes: No; Heart Disease: Yes - Paternal Grandparents; Hereditary Spherocytosis: No; Hypertension: Yes - Paternal Grandparents; Kidney Disease: No; Lung Disease: No; Seizures: No; Stroke: No; Thyroid Problems: No; Tuberculosis: No; Current every day smoker; Marital Status - Single; Alcohol Use: Never; Drug Use: Prior History; Caffeine Use: Daily; Financial Concerns: No; Food, Clothing or Shelter Needs: No; Support System Lacking: No; Transportation Concerns: No; Advanced Directives: No; Patient does not  want information on Advanced Directives Physician Affirmation I have reviewed and agree with the above information. Electronic Signature(s) Signed: 02/08/2018 3:41:30 PM By: Lawanda Cousins Signed: 02/08/2018 5:13:37 PM By: Alric Quan Entered By: Lawanda Cousins on 02/08/2018 14:26:23 [name] ([id number]) -------------------------------------------------------------------------------- [place] Details Patient Name: Heather Dear A. Date of Service: 02/08/2018 Medical Record Number: [id number] Patient Account Number: [id number] Date of Birth/Sex: 12-11-1975 (42 y.o. 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

## 2018-02-15 ENCOUNTER — Encounter: Payer: Medicaid Other | Admitting: Nurse Practitioner

## 2018-02-15 ENCOUNTER — Other Ambulatory Visit
Admission: RE | Admit: 2018-02-15 | Discharge: 2018-02-15 | Disposition: A | Discharge status: Home / Self Care | Payer: Medicaid Other | Source: Ambulatory Visit | Attending: Nurse Practitioner | Admitting: Nurse Practitioner

## 2018-02-15 DIAGNOSIS — E1151 Type 2 diabetes mellitus with diabetic peripheral angiopathy without gangrene: Secondary | ICD-10-CM | POA: Diagnosis not present

## 2018-02-15 DIAGNOSIS — L089 Local infection of the skin and subcutaneous tissue, unspecified: Secondary | ICD-10-CM | POA: Insufficient documentation

## 2018-02-18 LAB — AEROBIC CULTURE W GRAM STAIN (SUPERFICIAL SPECIMEN)

## 2018-02-20 NOTE — Progress Notes (Signed)
[name] ([id number]) Visit Report for 02/15/2018 Arrival Information Details Patient Name: [name], [name] Date of Service: 02/15/2018 10:15 AM Medical Record Number: [id number] Patient Account Number: [id number] Date of Birth/Sex: [birth year] (42 y.o. F) Treating RN: Roger Shelter Primary Care Denise Washburn: Suzan Garibaldi Other Clinician: Referring Demia Viera: Suzan Garibaldi Treating Taytum Wheller/Extender: Cathie Olden in Treatment: 4 Visit Information History Since Last Visit All ordered tests and consults were completed: No Patient Arrived: Wheel Chair Added or deleted any medications: No Arrival Time: 10:28 Any new allergies or adverse reactions: No Accompanied By: mom Had a fall or experienced change in No Transfer Assistance: None activities of daily living that may affect Patient Identification Verified: Yes risk of falls: Secondary Verification Process Completed: Yes Signs or symptoms of abuse/neglect since last visito No Patient Has Alerts: Yes Hospitalized since last visit: No Patient Alerts: DMII Implantable device outside of the clinic excluding No cellular tissue based products placed in the center since last visit: Pain Present Now: No Electronic Signature(s) Signed: 02/15/2018 3:28:21 PM By: Roger Shelter Entered By: Roger Shelter on 02/15/2018 10:28:57 [name] ([id number]) -------------------------------------------------------------------------------- Encounter Discharge Information Details Patient Name: Heather Dear A. Date of Service: 02/15/2018 10:15 AM Medical Record Number: [id number] Patient Account Number: [id number] Date of Birth/Sex: 1976-02-09 (42 y.o. F) Treating RN: Montey Hora Primary Care Bethzy Hauck: Suzan Garibaldi Other Clinician: Referring Jaymz Traywick: Suzan Garibaldi Treating Jaise Moser/Extender: Cathie Olden in Treatment: 4 Encounter Discharge Information Items Discharge Pain Level: 0 Discharge Condition:  Stable Ambulatory Status: Wheelchair Discharge Destination: Home Private Transportation: Auto Schedule Follow-up Appointment: Yes Medication Reconciliation completed and provided No to Patient/Care Janiel Crisostomo: Clinical Summary of Care: Electronic Signature(s) Signed: 02/15/2018 3:02:22 PM By: Montey Hora Entered By: Montey Hora on 02/15/2018 11:10:45 [name] ([id number]) -------------------------------------------------------------------------------- Lower Extremity Assessment Details Patient Name: Heather Dear A. Date of Service: 02/15/2018 10:15 AM Medical Record Number: [id number] Patient Account Number: [id number] Date of Birth/Sex: 29-Sep-1976 (42 y.o. F) Treating RN: Roger Shelter Primary Care Artie Mcintyre: Suzan Garibaldi Other Clinician: Referring Fynley Chrystal: Suzan Garibaldi Treating Kloi Brodman/Extender: Cathie Olden in Treatment: 4 Edema Assessment Assessed: [Left: No] [Right: No] Edema: [Left: N] [Right: o] Vascular Assessment Claudication: Claudication Assessment [Right:None] Pulses: Dorsalis Pedis Palpable: [Right:Yes] Posterior Tibial Extremity colors, hair growth, and conditions: Extremity Color: [Right:Normal] Hair Growth on Extremity: [Right:Yes] Temperature of Extremity: [Right:Warm] Capillary Refill: [Right:< 3 seconds] Toe Nail Assessment Left: Right: Thick: No Discolored: No Deformed: No Improper Length and Hygiene: No Electronic Signature(s) Signed: 02/15/2018 3:28:21 PM By: Roger Shelter Entered By: Roger Shelter on 02/15/2018 10:39:50 [name], [name] ([id number]) -------------------------------------------------------------------------------- Multi Wound Chart Details Patient Name: Heather Dear A. Date of Service: 02/15/2018 10:15 AM Medical Record Number: [id number] Patient Account Number: [id number] Date of Birth/Sex: [birth year] (42 y.o. F) Treating RN: Ahmed Prima Primary Care Jo Cerone: Suzan Garibaldi Other  Clinician: Referring Charlea Nardo: Suzan Garibaldi Treating Wilson Dusenbery/Extender: Cathie Olden in Treatment: 4 Vital Signs Height(in): 61 Pulse(bpm): 75 Weight(lbs): 250 Blood Pressure(mmHg): 93/52 Body Mass Index(BMI): 37 Temperature(F): 98.3 Respiratory Rate 18 (breaths/min): Photos: [1:No Photos] [2:No Photos] [3:No Photos] Wound Location: [1:Left Toe Great] [2:Midline Abdomen - Lower Quadrant] [3:Left Toe Great - Lateral] Wounding Event: [1:Gradually Appeared] [2:Pimple] [3:Trauma] Primary Etiology: [1:Diabetic Wound/Ulcer of the Lower Extremity] [2:Cyst] [3:Diabetic Wound/Ulcer of the Lower Extremity] Comorbid History: [1:Chronic Obstructive Pulmonary Disease (COPD), Congestive Heart Failure, Hypertension, Type II Diabetes, Neuropathy, Seizure Disorder] [2:N/A] [3:Chronic Obstructive Pulmonary Disease (COPD), Congestive Heart Failure, Hypertension,  Type II Diabetes, Neuropathy, Seizure Disorder] Date Acquired: [1:01/01/2018] [2:01/19/2018] [3:01/29/2018] Weeks  of Treatment: [1:4] [2:3] [3:2] Wound Status: [1:Open] [2:Healed - Epithelialized] [3:Healed - Epithelialized] Pending Amputation on [1:Yes] [2:No] [3:No] Presentation: Measurements L x W x D [1:1.4x1.2x0.4] [2:0x0x0] [3:0x0x0] (cm) Area (cm) : [1:1.319] [2:0] [3:0] Volume (cm) : [1:0.528] [2:0] [3:0] % Reduction in Area: [1:82.70%] [2:100.00%] [3:100.00%] % Reduction in Volume: [1:82.70%] [2:100.00%] [3:100.00%] Classification: [1:Grade 2] [2:Partial Thickness] [3:Grade 1] Exudate Amount: [1:Large] [2:N/A] [3:None Present] Exudate Type: [1:Serous] [2:N/A] [3:N/A] Exudate Color: [1:amber] [2:N/A] [3:N/A] Wound Margin: [1:Flat and Intact] [2:N/A] [3:Flat and Intact] Granulation Amount: [1:Large (67-100%)] [2:N/A] [3:None Present (0%)] Granulation Quality: [1:Red, Pink] [2:N/A] [3:N/A] Necrotic Amount: [1:Small (1-33%)] [2:N/A] [3:None Present (0%)] Necrotic Tissue: [1:Eschar, Adherent Slough] [2:N/A] [3:N/A] Exposed  Structures: [1:Fat Layer (Subcutaneous Tissue) Exposed: Yes Fascia: No Tendon: No Muscle: No] [2:N/A] [3:Fascia: No Fat Layer (Subcutaneous Tissue) Exposed: No Tendon: No Muscle: No] Joint: No Joint: No Bone: No Bone: No Epithelialization: None N/A Large (67-100%) Debridement: Debridement - Excisional N/A N/A Pre-procedure 10:44 N/A N/A Verification/Time Out Taken: Pain Control: Lidocaine 4% Topical Solution N/A N/A Tissue Debrided: Subcutaneous, Slough N/A N/A Level: Skin/Subcutaneous Tissue N/A N/A Debridement Area (sq cm): 1.68 N/A N/A Instrument: Curette N/A N/A Bleeding: Minimum N/A N/A Hemostasis Achieved: Pressure N/A N/A Procedural Pain: 0 N/A N/A Post Procedural Pain: 0 N/A N/A Debridement Treatment Procedure was not tolerated N/A N/A Response: well Post Debridement 1.4x1.2x0.5 N/A N/A Measurements L x W x D (cm) Post Debridement Volume: 0.66 N/A N/A (cm) Periwound Skin Texture: Excoriation: No No Abnormalities Noted Excoriation: No Induration: No Induration: No Callus: No Callus: No Crepitus: No Crepitus: No Rash: No Rash: No Scarring: No Scarring: No Periwound Skin Moisture: Maceration: Yes No Abnormalities Noted Maceration: No Dry/Scaly: No Dry/Scaly: No Periwound Skin Color: Erythema: Yes No Abnormalities Noted Atrophie Blanche: No Atrophie Blanche: No Cyanosis: No Cyanosis: No Ecchymosis: No Ecchymosis: No Erythema: No Hemosiderin Staining: No Hemosiderin Staining: No Mottled: No Mottled: No Pallor: No Pallor: No Rubor: No Rubor: No Erythema Location: Circumferential N/A N/A Temperature: No Abnormality N/A No Abnormality Tenderness on Palpation: No No Yes Wound Preparation: Ulcer Cleansing: N/A Ulcer Cleansing: Rinsed/Irrigated with Saline Rinsed/Irrigated with Saline Topical Anesthetic Applied: Topical Anesthetic Applied: Other: lidocaine 4% None Procedures Performed: Debridement N/A N/A Treatment Notes Wound #1 (Left Toe  Great) 1. Cleansed with: Clean wound with Normal Saline 2. Anesthetic Topical Lidocaine 4% cream to wound bed prior to debridement 4. Dressing Applied: Hydrafera [place], Raiden A. ([id number]) 5. Secondary Dressing Applied Kerlix/Conform 6. Footwear/Offloading device applied Ace wrap Notes light conform wrap and secure with tape on top of foot and dorsal on foot . do not wrap tape around toe. Electronic Signature(s) Signed: 02/15/2018 11:46:43 AM By: Lawanda Cousins Entered By: Lawanda Cousins on 02/15/2018 11:46:42 [name] ([id number]) -------------------------------------------------------------------------------- [place] Details Patient Name: Heather Dear A. Date of Service: 02/15/2018 10:15 AM Medical Record Number: [id number] Patient Account Number: [id number] Date of Birth/Sex: [birth year] (42 y.o. F) Treating RN: Ahmed Prima Primary Care Topacio Cella: Suzan Garibaldi Other Clinician: Referring Baker Kogler: Suzan Garibaldi Treating Daisuke Bailey/Extender: Cathie Olden in Treatment: 4 Active Inactive ` Abuse / Safety / Falls / Self Care Management Nursing Diagnoses: Potential for falls Goals: Patient will not experience any injury related to falls Date Initiated: 01/18/2018 Target Resolution Date: 04/28/2018 Goal Status: Active Interventions: Assess fall risk on admission and as needed Assess: immobility, friction, shearing, incontinence upon admission and as needed Assess impairment of mobility on admission and as needed per policy Assess personal safety and home safety (  as indicated) on admission and as needed Assess self care needs on admission and as needed Notes: ` Nutrition Nursing Diagnoses: Imbalanced nutrition Impaired glucose control: actual or potential Potential for alteratiion in Nutrition/Potential for imbalanced nutrition Goals: Patient/caregiver agrees to and verbalizes understanding of need to use nutritional supplements  and/or vitamins as prescribed Date Initiated: 01/18/2018 Target Resolution Date: 04/28/2018 Goal Status: Active Patient/caregiver will maintain therapeutic glucose control Date Initiated: 01/18/2018 Target Resolution Date: 04/28/2018 Goal Status: Active Interventions: Assess patient nutrition upon admission and as needed per policy Provide education on elevated blood sugars and impact on wound healing Notes: [name], [name] ([id number]) Orientation to the Wound Care Program Nursing Diagnoses: Knowledge deficit related to the wound healing center program Goals: Patient/caregiver will verbalize understanding of the [facility] Date Initiated: 01/18/2018 Target Resolution Date: 01/27/2018 Goal Status: Active Interventions: Provide education on orientation to the wound center Notes: ` Wound/Skin Impairment Nursing Diagnoses: Impaired tissue integrity Knowledge deficit related to smoking impact on wound healing Knowledge deficit related to ulceration/compromised skin integrity Goals: Ulcer/skin breakdown will have a volume reduction of 80% by week 12 Date Initiated: 01/18/2018 Target Resolution Date: 04/21/2018 Goal Status: Active Interventions: Assess patient/caregiver ability to perform ulcer/skin care regimen upon admission and as needed Assess ulceration(s) every visit Notes: Electronic Signature(s) Signed: 02/16/2018 4:29:16 PM By: Alric Quan Entered By: Alric Quan on 02/15/2018 10:43:51 [name], [name] ([id number]) -------------------------------------------------------------------------------- Pain Assessment Details Patient Name: Heather Dear A. Date of Service: 02/15/2018 10:15 AM Medical Record Number: [id number] Patient Account Number: [id number] Date of Birth/Sex: [birth year] (42 y.o. F) Treating RN: Roger Shelter Primary Care Angelo Caroll: Suzan Garibaldi Other Clinician: Referring Yuliana Vandrunen: Suzan Garibaldi Treating Maylen Waltermire/Extender:  Cathie Olden in Treatment: 4 Active Problems Location of Pain Severity and Description of Pain Patient Has Paino No Site Locations Pain Management and Medication Current Pain Management: Electronic Signature(s) Signed: 02/15/2018 3:28:21 PM By: Roger Shelter Entered By: Roger Shelter on 02/15/2018 10:29:16 [name] ([id number]) -------------------------------------------------------------------------------- Patient/Caregiver Education Details Patient Name: Heather Dear A. Date of Service: 02/15/2018 10:15 AM Medical Record Number: [id number] Patient Account Number: [id number] Date of Birth/Gender: [birth year] (42 y.o. F) Treating RN: Montey Hora Primary Care Physician: Suzan Garibaldi Other Clinician: Referring Physician: Suzan Garibaldi Treating Physician/Extender: Cathie Olden in Treatment: 4 Education Assessment Education Provided To: Patient Education Topics Provided Wound Debridement: Handouts: Wound Debridement Methods: Explain/Verbal Responses: State content correctly Wound/Skin Impairment: Handouts: Caring for Your Ulcer Methods: Explain/Verbal Responses: State content correctly Electronic Signature(s) Signed: 02/15/2018 3:02:22 PM By: Montey Hora Entered By: Montey Hora on 02/15/2018 11:11:03 [name], [name] ([id number]) -------------------------------------------------------------------------------- Wound Assessment Details Patient Name: Heather Dear A. Date of Service: 02/15/2018 10:15 AM Medical Record Number: [id number] Patient Account Number: [id number] Date of Birth/Sex: Aug 11, 1976 (42 y.o. F) Treating RN: Roger Shelter Primary Care Natiya Seelinger: Suzan Garibaldi Other Clinician: Referring Vera Wishart: Suzan Garibaldi Treating Kristoffer Bala/Extender: Cathie Olden in Treatment: 4 Wound Status Wound Number: 1 Primary Diabetic Wound/Ulcer of the Lower Extremity Etiology: Wound Location: Left Toe Great Wound Open Wounding Event:  Gradually Appeared Status: Date Acquired: 01/01/2018 Comorbid Chronic Obstructive Pulmonary Disease Weeks Of Treatment: 4 History: (COPD), Congestive Heart Failure, Clustered Wound: No Hypertension, Type II Diabetes, Neuropathy, Pending Amputation On Presentation Seizure Disorder Photos Photo Uploaded By: Gretta Cool, BSN, RN, CWS, [name] on 02/15/2018 14:51:44 Wound Measurements Length: (cm) 1.4 Width: (cm) 1.2 Depth: (cm) 0.4 Area: (cm) 1.319 Volume: (cm) 0.528 % Reduction in Area: 82.7% % Reduction in Volume: 82.7% Epithelialization: None Tunneling: No Undermining:  No Wound Description Classification: Grade 2 Foul Odor Wound Margin: Flat and Intact Slough/Fi Exudate Amount: Large Exudate Type: Serous Exudate Color: amber After Cleansing: No brino Yes Wound Bed Granulation Amount: Large (67-100%) Exposed Structure Granulation Quality: Red, Pink Fascia Exposed: No Necrotic Amount: Small (1-33%) Fat Layer (Subcutaneous Tissue) Exposed: Yes Necrotic Quality: Eschar, Adherent Slough Tendon Exposed: No Muscle Exposed: No Joint Exposed: No Bone Exposed: No [name], [name] ([id number]) Periwound Skin Texture Texture Color No Abnormalities Noted: No No Abnormalities Noted: No Callus: No Atrophie Blanche: No Crepitus: No Cyanosis: No Excoriation: No Ecchymosis: No Induration: No Erythema: Yes Rash: No Erythema Location: Circumferential Scarring: No Hemosiderin Staining: No Mottled: No Moisture Pallor: No No Abnormalities Noted: No Rubor: No Dry / Scaly: No Maceration: Yes Temperature / Pain Temperature: No Abnormality Wound Preparation Ulcer Cleansing: Rinsed/Irrigated with Saline Topical Anesthetic Applied: Other: lidocaine 4%, Treatment Notes Wound #1 (Left Toe Great) 1. Cleansed with: Clean wound with Normal Saline 2. Anesthetic Topical Lidocaine 4% cream to wound bed prior to debridement 4. Dressing Applied: Hydrafera Blue 5. Secondary Dressing  Applied Kerlix/Conform 6. Footwear/Offloading device applied Ace wrap Notes light conform wrap and secure with tape on top of foot and dorsal on foot . do not wrap tape around toe. Electronic Signature(s) Signed: 02/15/2018 3:28:21 PM By: Roger Shelter Entered By: Roger Shelter on 02/15/2018 10:37:58 [name] ([id number]) -------------------------------------------------------------------------------- Wound Assessment Details Patient Name: Heather Dear A. Date of Service: 02/15/2018 10:15 AM Medical Record Number: [id number] Patient Account Number: [id number] Date of Birth/Sex: 14-Aug-1976 (42 y.o. F) Treating RN: Roger Shelter Primary Care Paisly Fingerhut: Suzan Garibaldi Other Clinician: Referring Malkie Wille: Suzan Garibaldi Treating Andrea Ferrer/Extender: Cathie Olden in Treatment: 4 Wound Status Wound Number: 2 Primary Etiology: Cyst Wound Location: Midline Abdomen - Lower Quadrant Wound Status: Healed - Epithelialized Wounding Event: Pimple Date Acquired: 01/19/2018 Weeks Of Treatment: 3 Clustered Wound: No Photos Photo Uploaded By: Gretta Cool, BSN, RN, CWS, [name] on 02/15/2018 14:51:45 Wound Measurements Length: (cm) 0 Width: (cm) 0 Depth: (cm) 0 Area: (cm) 0 Volume: (cm) 0 % Reduction in Area: 100% % Reduction in Volume: 100% Wound Description Classification: Partial Thickness Periwound Skin Texture Texture Color No Abnormalities Noted: No No Abnormalities Noted: No Moisture No Abnormalities Noted: No Electronic Signature(s) Signed: 02/15/2018 3:28:21 PM By: Roger Shelter Entered By: Roger Shelter on 02/15/2018 10:35:52 [name], [name] ([id number]) -------------------------------------------------------------------------------- Wound Assessment Details Patient Name: Heather Dear A. Date of Service: 02/15/2018 10:15 AM Medical Record Number: [id number] Patient Account Number: [id number] Date of Birth/Sex: 1976-07-18 (42 y.o. F) Treating RN:  Ahmed Prima Primary Care Anelly Samarin: Suzan Garibaldi Other Clinician: Referring Caz Weaver: Suzan Garibaldi Treating Mauria Asquith/Extender: Cathie Olden in Treatment: 4 Wound Status Wound Number: 3 Primary Diabetic Wound/Ulcer of the Lower Extremity Etiology: Wound Location: Left Toe Great - Lateral Wound Healed - Epithelialized Wounding Event: Trauma Status: Date Acquired: 01/29/2018 Comorbid Chronic Obstructive Pulmonary Disease Weeks Of Treatment: 2 History: (COPD), Congestive Heart Failure, Clustered Wound: No Hypertension, Type II Diabetes, Neuropathy, Seizure Disorder Photos Photo Uploaded By: Gretta Cool, BSN, RN, CWS, [name] on 02/15/2018 14:52:14 Wound Measurements Length: (cm) 0 % Reduct Width: (cm) 0 % Reduct Depth: (cm) 0 Epitheli Area: (cm) 0 Tunneli Volume: (cm) 0 Undermi ion in Area: 100% ion in Volume: 100% alization: Large (67-100%) ng: No ning: No Wound Description Classification: Grade 1 Foul Od Wound Margin: Flat and Intact Slough/ Exudate Amount: None Present or After Cleansing: No Fibrino No Wound Bed Granulation Amount: None Present (0%) Exposed Structure Necrotic Amount: None  Present (0%) Fascia Exposed: No Fat Layer (Subcutaneous Tissue) Exposed: No Tendon Exposed: No Muscle Exposed: No Joint Exposed: No Bone Exposed: No Periwound Skin Texture Texture Color [name], [name] ([id number]) No Abnormalities Noted: No No Abnormalities Noted: No Callus: No Atrophie Blanche: No Crepitus: No Cyanosis: No Excoriation: No Ecchymosis: No Induration: No Erythema: No Rash: No Hemosiderin Staining: No Scarring: No Mottled: No Pallor: No Moisture Rubor: No No Abnormalities Noted: No Dry / Scaly: No Temperature / Pain Maceration: No Temperature: No Abnormality Tenderness on Palpation: Yes Wound Preparation Ulcer Cleansing: Rinsed/Irrigated with Saline Topical Anesthetic Applied: None Electronic Signature(s) Signed: 02/16/2018 4:29:16 PM By:  Alric Quan Entered By: Alric Quan on 02/15/2018 10:47:11 [name] ([id number]) -------------------------------------------------------------------------------- [place] Details Patient Name: Heather Dear A. Date of Service: 02/15/2018 10:15 AM Medical Record Number: [id number] Patient Account Number: [id number] Date of Birth/Sex: 1976-04-25 (42 y.o. F) Treating RN: Roger Shelter Primary Care Doha Boling: Suzan Garibaldi Other Clinician: Referring Hodari Chuba: Suzan Garibaldi Treating Ivy Meriwether/Extender: Cathie Olden in Treatment: 4 Vital Signs Time Taken: 10:29 Temperature (F): 98.3 Height (in): 69 Pulse (bpm): 75 Weight (lbs): 250 Respiratory Rate (breaths/min): 18 Body Mass Index (BMI): 36.9 Blood Pressure (mmHg): 93/52 Reference Range: 80 - 120 mg / dl Electronic Signature(s) Signed: 02/15/2018 3:28:21 PM By: Roger Shelter Entered By: Roger Shelter on 02/15/2018 10:30:27

## 2018-02-20 NOTE — Progress Notes (Signed)
ZOUA, CAPORASO ([id number]) Visit Report for 02/15/2018 Chief Complaint Document Details Patient Name: [name], [name] A. Date of Service: 02/15/2018 10:15 AM Medical Record Number: [id number] Patient Account Number: [id number] Date of Birth/Sex: Sep 07, 1976 (42 y.o. F) Treating RN: Ahmed Prima Primary Care Provider: Suzan Garibaldi Other Clinician: Referring Provider: Suzan Garibaldi Treating Provider/Extender: Cathie Olden in Treatment: 4 Information Obtained from: Patient Chief Complaint She is here for evaluation of a left great toe ulcer Electronic Signature(s) Signed: 02/15/2018 11:46:56 AM By: Lawanda Cousins Entered By: Lawanda Cousins on 02/15/2018 11:46:56 [place], [place] ([id number]) -------------------------------------------------------------------------------- Debridement Details Patient Name: Heather Dear A. Date of Service: 02/15/2018 10:15 AM Medical Record Number: [id number] Patient Account Number: [id number] Date of Birth/Sex: 1976-01-19 (42 y.o. F) Treating RN: Ahmed Prima Primary Care Provider: Suzan Garibaldi Other Clinician: Referring Provider: Suzan Garibaldi Treating Provider/Extender: Cathie Olden in Treatment: 4 Debridement Performed for Wound #1 Left Toe Great Assessment: Performed By: Physician Lawanda Cousins, NP Debridement Type: Debridement Severity of Tissue Pre Fat layer exposed Debridement: Pre-procedure Verification/Time Yes - 10:44 Out Taken: Start Time: 10:44 Pain Control: Lidocaine 4% Topical Solution Total Area Debrided (L x W): 1.4 (cm) x 1.2 (cm) = 1.68 (cm) Tissue and other material Viable, Non-Viable, Slough, Subcutaneous, Fibrin/Exudate, Slough debrided: Level: Skin/Subcutaneous Tissue Debridement Description: Excisional Instrument: Curette Bleeding: Minimum Hemostasis Achieved: Pressure End Time: 10:47 Procedural Pain: 0 Post Procedural Pain: 0 Response to Treatment: Procedure was not tolerated well Post  Debridement Measurements of Total Wound Length: (cm) 1.4 Width: (cm) 1.2 Depth: (cm) 0.5 Volume: (cm) 0.66 Character of Wound/Ulcer Post Debridement: Requires Further Debridement Severity of Tissue Post Debridement: Fat layer exposed Post Procedure Diagnosis Same as Pre-procedure Electronic Signature(s) Signed: 02/15/2018 5:04:33 PM By: Lawanda Cousins Signed: 02/16/2018 4:29:16 PM By: Alric Quan Entered By: Alric Quan on 02/15/2018 10:48:42 [name], [name] ([id number]) -------------------------------------------------------------------------------- HPI Details Patient Name: Heather Dear A. Date of Service: 02/15/2018 10:15 AM Medical Record Number: [id number] Patient Account Number: [id number] Date of Birth/Sex: [birth year] (42 y.o. F) Treating RN: Ahmed Prima Primary Care Provider: Suzan Garibaldi Other Clinician: Referring Provider: Suzan Garibaldi Treating Provider/Extender: Cathie Olden in Treatment: 4 History of Present Illness HPI Description: 01/18/18-She is here for initial evaluation of the left great toe ulcer. She is a poor historian in regards to timeframe in detail. She states approximately 4 weeks ago she lacerated her toe on something in the house. She followed up with her primary care who placed her on Bactrim and ultimately a second dose of Bactrim prior to coming to wound clinic. She states she has been treating the toe with peroxide, Betadine and a Band-Aid. She did not check her blood sugar this morning but checked it yesterday morning it was 327; she is unaware of a recent A1c and there are no current records. She saw Dr. she would've orthopedics last week for an old injury to the left ankle, she states he did not see her toe, nor did she bring it to his attention. She smokes approximately 1 pack cigarettes a day. Her social situation is concerning, she arrives this morning with her mother who appears extremely intoxicated/under the influence; her  mother was asked to leave the room and be monitored by the patient's grandmother. The patient's aunt then accompanied the patient and the room throughout the rest of the appointment. We had a lengthy discussion regarding the deleterious effects of uncontrolled hyperglycemia and smoking as it relates to wound healing and overall health. She was strongly encouraged to decrease  her smoking and get her diabetes under better control. She states she is currently on a diet and has cut down her [facility] consumption. The left toe is erythematous, macerated and slightly edematous with malodor present. The edema in her left foot is below her baseline, there is no erythema streaking. We will treat her with Santyl, doxycycline; we have ordered and xray, culture and provided a Peg assist surgical shoe and cultured the wound. 01/25/18-She is here in follow-up evaluation for a left great toe ulcer and presents with an abscess to her suprapubic area. She states her blood sugars remain elevated, feeling "sick" and if levels are below 250, but she is trying. She has made no attempt to decrease her smoking stating that we "can't take away her food in her cigarettes". She has been compliant with offloading using the PEG assist you. She is using Santyl daily. the culture obtained last week grew staph aureus and Enterococcus faecalis; continues on the doxycycline and Augmentin was added on Monday. The suprapubic area has erythema, no femoral variation, purple discoloration, minimal induration, was accessed with a cotton tip applicator with sanguinopurulent drainage, this was cultured, I suspect the current antibiotic treatment will cover and we will not add anything to her current treatment plan. She was advised to go to urgent care or ER with any change in redness, induration or fever. 02/01/18-She is here in follow-up evaluation for left great toe ulcers and a new abdominal abscess from last week. She was able to use  packing until earlier this week, where she "forgot it was there". She states she was feeling ill with GI symptoms last week and was not taking her antibiotic. She states her glucose levels have been predominantly less than 200, with occasional levels between 200-250. She thinks this was contributing to her GI symptoms as they have resolved without intervention. There continues to be significant laceration to left toe, otherwise it clinically looks stable/improved. There is now less superficial opening to the lateral aspect of the great toe that was residual blister. We will transition to [facility] to all wounds, she will continue her Augmentin. If there is no change or deterioration next week for reculture. 02/08/18-She is here in follow-up evaluation for left great toe ulcer and abdominal ulcer. There is an improvement in both wounds. She has been wrapping her left toe with coban, not by our direction, which has created an area of discoloration to the medial aspect; she has been advised to NOT use coban secondary to her neuropathy. She states her glucose levels have been high over this last week ranging from 200-350, she continues to smoke. She admits to being less compliant with her offloading shoe. We will continue with same treatment plan and she will follow-up next week. 02/15/18-She is here in follow-up evaluation for left great toe ulcer and abdominal ulcer. The abdominal ulcer is epithelialized. The left great toe ulcer is improved and all injury from last week using the Coban wrap is resolved, the lateral ulcer is healed. She admits to noncompliance with wearing offloading shoe and admits to glucose levels being greater than 300 most of the week. She continues to smoke and expresses no desire to quit. There is one area medially that probes deeper than it has historically, erythema to the toe and dorsal foot has consistently waxed and waned. There is no overt signs of cellulitis  or infection but we will culture the wound for any occult infection given the new area of depth and  erythema. We will hold off on sensitivities for initiation of antibiotic therapy. Electronic Signature(s) Signed: 02/15/2018 11:48:57 AM By: Lawanda Cousins Entered By: Lawanda Cousins on 02/15/2018 11:48:56 [name] ([id number] [name], [name] ([id number]) -------------------------------------------------------------------------------- Physician Orders Details Patient Name: Heather Dear A. Date of Service: 02/15/2018 10:15 AM Medical Record Number: [id number] Patient Account Number: [id number] Date of Birth/Sex: 10-Jan-1976 (42 y.o. F) Treating RN: Ahmed Prima Primary Care Provider: Suzan Garibaldi Other Clinician: Referring Provider: Suzan Garibaldi Treating Provider/Extender: Cathie Olden in Treatment: 4 Verbal / Phone Orders: Yes Clinician: Pinkerton, Debi Read Back and Verified: Yes Diagnosis Coding Wound Cleansing Wound #1 Left Toe Great o Clean wound with Normal Saline. o Cleanse wound with mild soap and water Anesthetic (add to Medication List) Wound #1 Left Toe Great o Topical Lidocaine 4% cream applied to wound bed prior to debridement (In Clinic Only). Primary Wound Dressing Wound #1 Left Toe Great o Hydrafera Blue Ready Transfer Secondary Dressing Wound #1 Left Toe Great o Dry Gauze o Conform/Kerlix Dressing Change Frequency Wound #1 Left Toe Great o Change dressing every day. Follow-up Appointments Wound #1 Left Toe Great o Return Appointment in 1 week. Edema Control Wound #1 Left Toe Great o Patient to wear own compression stockings o Elevate legs to the level of the heart and pump ankles as often as possible o Other: - ace wrap Off-Loading Wound #1 Left Toe Great o Open toe surgical shoe with peg assist. Additional Orders / Instructions Wound #1 Left Toe Great o Stop Smoking o Increase protein intake. [name], [name] ([id number]) Patient Medications Allergies: erythromycin base Notifications Medication Indication Start End lidocaine DOSE 1 - topical 4 % cream - 1 cream topical Electronic Signature(s) Signed: 02/15/2018 5:04:33 PM By: Lawanda Cousins Signed: 02/16/2018 4:29:16 PM By: Alric Quan Entered By: Alric Quan on 02/15/2018 10:52:01 [name] ([id number]) -------------------------------------------------------------------------------- Prescription 02/15/2018 Patient Name: Heather Dear A. Provider: Lawanda Cousins NP Date of Birth: Feb 23, 1976 NPI#: [id number] Sex: F DEA#: [id number] Phone #: [phone] License #: Patient Address: [place], [place] [address] [address], [place], [place] [address] [phone] Allergies erythromycin base Medication Medication: Route: Strength: Form: lidocaine 4 % topical cream topical 4% cream Class: TOPICAL LOCAL ANESTHETICS Dose: Frequency / Time: Indication: 1 1 cream topical Number of Refills: Number of Units: 0 Generic Substitution: Start Date: End Date: One Time Use: Substitution Permitted No Note to Pharmacy: Signature(s): Date(s): Electronic Signature(s) Signed: 02/15/2018 5:04:33 PM By: Lawanda Cousins Signed: 02/16/2018 4:29:16 PM By: Alric Quan Entered By: Alric Quan on 02/15/2018 10:52:02 [name] ([id number]) --------------------------------------------------------------------------------  Problem List Details Patient Name: Heather Dear A. Date of Service: 02/15/2018 10:15 AM Medical Record Number: [id number] Patient Account Number: [id number] Date of Birth/Sex: 06/20/1976 (41 y.o. 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 ([id number]) -------------------------------------------------------------------------------- Progress Note Details Patient Name: Heather Dear A. Date of Service: 02/15/2018  10:15 AM Medical Record Number: [id number] Patient Account Number: [id number] Date of Birth/Sex: [birth year] (41 y.o. F) Treating RN: Ahmed Prima Primary Care Provider: Suzan Garibaldi Other Clinician: Referring Provider: Suzan Garibaldi Treating Provider/Extender: Cathie Olden in Treatment: 4 Subjective Chief Complaint Information obtained from Patient She is here for evaluation of a left great toe ulcer History of Present Illness (HPI) 01/18/18-She is here for initial evaluation of the left great toe ulcer. She is a poor historian in regards to timeframe in detail. She states approximately 4 weeks ago she lacerated her toe on something in the house. She followed up with her primary care who placed her on Bactrim and ultimately a second dose of Bactrim prior to coming to wound clinic. She states she has been treating the toe with peroxide, Betadine and a Band-Aid. She did not check her blood sugar this morning but checked it yesterday morning it was 327; she is unaware of a recent A1c and there are no current records. She saw Dr. she would've orthopedics last week for an old injury to the left ankle, she states he did not see her toe, nor did she bring it to his attention. She smokes  approximately 1 pack cigarettes a day. Her social situation is concerning, she arrives this morning with her mother who appears extremely intoxicated/under the influence; her mother was asked to leave the room and be monitored by the patient's grandmother. The patient's aunt then accompanied the patient and the room throughout the rest of the appointment. We had a lengthy discussion regarding the deleterious effects of uncontrolled hyperglycemia and smoking as it relates to wound healing and overall health. She was strongly encouraged to decrease her smoking and get her diabetes under better control. She states she is currently on a diet and has cut down her [facility] consumption. The left toe is erythematous, macerated and slightly edematous with malodor present. The edema in her left foot is below her baseline, there is no erythema streaking. We will treat her with Santyl, doxycycline; we have ordered and xray, culture and provided a Peg assist surgical shoe and cultured the wound. 01/25/18-She is here in follow-up evaluation for a left great toe ulcer and presents with an abscess to her suprapubic area. She states her blood sugars remain elevated, feeling "sick" and if levels are below 250, but she is trying. She has made no attempt to decrease her smoking stating that we "can't take away her food in her cigarettes". She has been compliant with offloading using the PEG assist you. She is using Santyl daily. the culture obtained last week grew staph aureus and Enterococcus faecalis; continues on the doxycycline and Augmentin was added on Monday. The suprapubic area has erythema, no femoral variation, purple discoloration, minimal induration, was accessed with a cotton tip applicator with sanguinopurulent drainage, this was cultured, I suspect the current antibiotic treatment will cover and we will not add anything to her current treatment plan. She was advised to go to urgent care or ER with any  change in redness, induration or fever. 02/01/18-She is here in follow-up evaluation for left great toe ulcers and a new abdominal abscess from last week. She was able to use packing until earlier this week, where she "forgot it was there". She states she was feeling ill with GI symptoms last week and was not taking her antibiotic. She states her glucose levels have been predominantly less than 200, with occasional levels between 200-250. She thinks this was contributing to her  GI symptoms as they have resolved without intervention. There continues to be significant laceration to left toe, otherwise it clinically looks stable/improved. There is now less superficial opening to the lateral aspect of the great toe that was residual blister. We will transition to [facility] to all wounds, she will continue her Augmentin. If there is no change or deterioration next week for reculture. 02/08/18-She is here in follow-up evaluation for left great toe ulcer and abdominal ulcer. There is an improvement in both wounds. She has been wrapping her left toe with coban, not by our direction, which has created an area of discoloration to the medial aspect; she has been advised to NOT use coban secondary to her neuropathy. She states her glucose levels have been high over this last week ranging from 200-350, she continues to smoke. She admits to being less compliant with her offloading shoe. We will continue with same treatment plan and she will follow-up next week. 02/15/18-She is here in follow-up evaluation for left great toe ulcer and abdominal ulcer. The abdominal ulcer is epithelialized. The left great toe ulcer is improved and all injury from last week using the Coban wrap is resolved, the lateral ulcer is healed. She admits to noncompliance with wearing offloading shoe and admits to glucose levels being greater than 300 most of the week. She continues to smoke and expresses no desire to quit. There is one  area medially that probes deeper than it has historically, erythema to the toe and dorsal foot has consistently waxed and waned. There is no overt signs of cellulitis or infection but we will culture the wound for any occult infection given the new area of depth and erythema. We will hold off on Lopp, Meghna A. ([id number]) sensitivities for initiation of antibiotic therapy. Patient History Information obtained from Patient. Family History Cancer - Paternal Grandparents, Heart Disease - Paternal Grandparents, Hypertension - Paternal Grandparents, No family history of Diabetes, Hereditary Spherocytosis, Kidney Disease, Lung Disease, Seizures, Stroke, Thyroid Problems, Tuberculosis. Social History Current every day smoker, Marital Status - Single, Alcohol Use - Never, Drug Use - Prior History, Caffeine Use - Daily. Objective Constitutional Vitals Time Taken: 10:29 AM, Height: 69 in, Weight: 250 lbs, BMI: 36.9, Temperature: 98.3 F, Pulse: 75 bpm, Respiratory Rate: 18 breaths/min, Blood Pressure: 93/52 mmHg. Integumentary (Hair, Skin) Wound #1 status is Open. Original cause of wound was Gradually Appeared. The wound is located on the Left Toe Great. The wound measures 1.4cm length x 1.2cm width x 0.4cm depth; 1.319cm^2 area and 0.528cm^3 volume. There is Fat Layer (Subcutaneous Tissue) Exposed exposed. There is no tunneling or undermining noted. There is a large amount of serous drainage noted. The wound margin is flat and intact. There is large (67-100%) red, pink granulation within the wound bed. There is a small (1-33%) amount of necrotic tissue within the wound bed including Eschar and Adherent Slough. The periwound skin appearance exhibited: Maceration, Erythema. The periwound skin appearance did not exhibit: Callus, Crepitus, Excoriation, Induration, Rash, Scarring, Dry/Scaly, Atrophie Blanche, Cyanosis, Ecchymosis, Hemosiderin Staining, Mottled, Pallor, Rubor. The surrounding wound  skin color is noted with erythema which is circumferential. Periwound temperature was noted as No Abnormality. Wound #2 status is Healed - Epithelialized. Original cause of wound was Pimple. The wound is located on the Midline Abdomen - Lower Quadrant. The wound measures 0cm length x 0cm width x 0cm depth; 0cm^2 area and 0cm^3 volume. Wound #3 status is Healed - Epithelialized. Original cause of wound was Trauma. The wound is  located on the [employer]. The wound measures 0cm length x 0cm width x 0cm depth; 0cm^2 area and 0cm^3 volume. There is no tunneling or undermining noted. There is a none present amount of drainage noted. The wound margin is flat and intact. There is no granulation within the wound bed. There is no necrotic tissue within the wound bed. The periwound skin appearance did not exhibit: Callus, Crepitus, Excoriation, Induration, Rash, Scarring, Dry/Scaly, Maceration, Atrophie Blanche, Cyanosis, Ecchymosis, Hemosiderin Staining, Mottled, Pallor, Rubor, Erythema. Periwound temperature was noted as No Abnormality. The periwound has tenderness on palpation. Assessment [name], [name] 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 There was a Excisional Skin/Subcutaneous Tissue Debridement with a total area of 1.68 sq cm performed by Lawanda Cousins, NP. With the following  instrument(s): Curette. to remove Viable and Non-Viable tissue/material Material removed includes Subcutaneous Tissue, and Slough, Fibrin/Exudate, and Convoy after achieving pain control using Lidocaine 4% Topical Solution. No specimens were taken. A time out was conducted at 10:44, prior to the start of the procedure. A Minimum amount of bleeding was controlled with Pressure. The procedure was not tolerated well with a pain level of 0 throughout and a pain level of 0 following the procedure. Post Debridement Measurements: 1.4cm length x 1.2cm width x 0.5cm depth; 0.66cm^3 volume. Character of Wound/Ulcer Post Debridement requires further debridement. Severity of Tissue Post Debridement is: Fat layer exposed. Post procedure Diagnosis Wound #1: Same as Pre-Procedure Plan Wound Cleansing: Wound #1 Left Toe Great: Clean wound with Normal Saline. Cleanse wound with mild soap and water Anesthetic (add to Medication List): Wound #1 Left Toe Great: Topical Lidocaine 4% cream applied to wound bed prior to debridement (In Clinic Only). Primary Wound Dressing: Wound #1 Left Toe Great: Hydrafera Blue Ready Transfer Secondary Dressing: Wound #1 Left Toe Great: Dry Gauze Conform/Kerlix Dressing Change Frequency: Wound #1 Left Toe Great: Change dressing every day. [name], [name] ([id number]) Follow-up Appointments: Wound #1 Left Toe Great: Return Appointment in 1 week. Edema Control: Wound #1 Left Toe Great: Patient to wear own compression stockings Elevate legs to the level of the heart and pump ankles as often as possible Other: - ace wrap Off-Loading: Wound #1 Left Toe Great: Open toe surgical shoe with peg assist. Additional Orders / Instructions: Wound #1 Left Toe Great: Stop Smoking Increase protein intake. The following medication(s) was prescribed: lidocaine topical 4 % cream 1 1 cream topical was prescribed at facility Electronic Signature(s) Signed: 02/15/2018 11:57:13 AM  By: Lawanda Cousins Entered By: Lawanda Cousins on 02/15/2018 11:57:13 [name] ([id number]) -------------------------------------------------------------------------------- ROS/PFSH Details Patient Name: Heather Dear A. Date of Service: 02/15/2018 10:15 AM Medical Record Number: [id number] Patient Account Number: [id number] Date of Birth/Sex: [birth year] (42 y.o. F) Treating RN: Ahmed Prima Primary Care Provider: Suzan Garibaldi Other Clinician: Referring Provider: Suzan Garibaldi Treating Provider/Extender: Cathie Olden in Treatment: 4 Information Obtained From Patient Wound History Do you currently have one  or more open woundso Yes How many open wounds do you currently haveo 1 Approximately how long have you had your woundso 1 month How have you been treating your wound(s) until nowo open to air Has your wound(s) ever healed and then re-openedo No Have you had any lab work done in the past montho No Have you tested positive for an antibiotic resistant organism (MRSA, VRE)o No Have you tested positive for osteomyelitis (bone infection)o No Have you had any tests for circulation on your legso No Hematologic/Lymphatic Medical History: Negative for: Anemia; Hemophilia; Human Immunodeficiency Virus; Lymphedema; Sickle Cell Disease Respiratory Medical History: Positive for: Chronic Obstructive Pulmonary Disease (COPD) Negative for: Aspiration; Asthma; Pneumothorax; Sleep Apnea; Tuberculosis Cardiovascular Medical History: Positive for: Congestive Heart Failure; Hypertension Negative for: Angina; Arrhythmia; Coronary Artery Disease; Deep Vein Thrombosis; Hypotension; Myocardial Infarction; Peripheral Arterial Disease; Peripheral Venous Disease; Phlebitis; Vasculitis Gastrointestinal Medical History: Negative for: Cirrhosis ; Colitis; Crohnos; Hepatitis A; Hepatitis B; Hepatitis C Endocrine Medical History: Positive for: Type II Diabetes Treated with: Insulin, Oral  agents Genitourinary Medical History: Negative for: End Stage Renal Disease Immunological [name], [name] ([id number]) Medical History: Negative for: Lupus Erythematosus; Raynaudos; Scleroderma Musculoskeletal Medical History: Negative for: Gout; Rheumatoid Arthritis; Osteoarthritis; Osteomyelitis Neurologic Medical History: Positive for: Neuropathy; Seizure Disorder Oncologic Medical History: Negative for: Received Chemotherapy; Received Radiation Immunizations Pneumococcal Vaccine: Received Pneumococcal Vaccination: Yes Immunization Notes: up to date Implantable Devices Family and Social History Cancer: Yes - Paternal Grandparents; Diabetes: No; Heart Disease: Yes - Paternal Grandparents; Hereditary Spherocytosis: No; Hypertension: Yes - Paternal Grandparents; Kidney Disease: No; Lung Disease: No; Seizures: No; Stroke: No; Thyroid Problems: No; Tuberculosis: No; Current every day smoker; Marital Status - Single; Alcohol Use: Never; Drug Use: Prior History; Caffeine Use: Daily; Financial Concerns: No; Food, Clothing or Shelter Needs: No; Support System Lacking: No; Transportation Concerns: No; Advanced Directives: No; Patient does not want information on Advanced Directives Physician Affirmation I have reviewed and agree with the above information. Electronic Signature(s) Signed: 02/15/2018 5:04:33 PM By: Lawanda Cousins Signed: 02/16/2018 4:29:16 PM By: Alric Quan Entered By: Lawanda Cousins on 02/15/2018 11:49:10 [name] ([id number]) -------------------------------------------------------------------------------- SuperBill Details Patient Name: Heather Dear A. Date of Service: 02/15/2018 Medical Record Number: [id number] Patient Account Number: [id number] Date of Birth/Sex: 09-Apr-1976 (42 y.o. 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

## 2018-02-22 ENCOUNTER — Encounter: Payer: Medicaid Other | Attending: Nurse Practitioner | Admitting: Nurse Practitioner

## 2018-02-22 DIAGNOSIS — E1151 Type 2 diabetes mellitus with diabetic peripheral angiopathy without gangrene: Secondary | ICD-10-CM | POA: Diagnosis not present

## 2018-02-22 DIAGNOSIS — L97529 Non-pressure chronic ulcer of other part of left foot with unspecified severity: Secondary | ICD-10-CM | POA: Diagnosis not present

## 2018-02-22 DIAGNOSIS — I509 Heart failure, unspecified: Secondary | ICD-10-CM | POA: Insufficient documentation

## 2018-02-22 DIAGNOSIS — G40909 Epilepsy, unspecified, not intractable, without status epilepticus: Secondary | ICD-10-CM | POA: Insufficient documentation

## 2018-02-22 DIAGNOSIS — L03032 Cellulitis of left toe: Secondary | ICD-10-CM | POA: Insufficient documentation

## 2018-02-22 DIAGNOSIS — L02211 Cutaneous abscess of abdominal wall: Secondary | ICD-10-CM | POA: Insufficient documentation

## 2018-02-22 DIAGNOSIS — I110 Hypertensive heart disease with heart failure: Secondary | ICD-10-CM | POA: Diagnosis not present

## 2018-02-22 DIAGNOSIS — F17210 Nicotine dependence, cigarettes, uncomplicated: Secondary | ICD-10-CM | POA: Diagnosis not present

## 2018-02-22 DIAGNOSIS — Z881 Allergy status to other antibiotic agents status: Secondary | ICD-10-CM | POA: Diagnosis not present

## 2018-02-22 DIAGNOSIS — J449 Chronic obstructive pulmonary disease, unspecified: Secondary | ICD-10-CM | POA: Insufficient documentation

## 2018-02-22 DIAGNOSIS — E11621 Type 2 diabetes mellitus with foot ulcer: Secondary | ICD-10-CM | POA: Insufficient documentation

## 2018-02-25 NOTE — Progress Notes (Addendum)
[name] ([id number]) Visit Report for 02/22/2018 Chief Complaint Document Details Patient Name: [name], [name] Date of Service: 02/22/2018 11:30 AM Medical Record Number: [id number] Patient Account Number: [id number] Date of Birth/Sex: Aug 07, 1976 (42 y.o. F) Treating RN: Ahmed Prima Primary Care Provider: Suzan Garibaldi Other Clinician: Referring Provider: Suzan Garibaldi Treating Provider/Extender: Cathie Olden in Treatment: 5 Information Obtained from: Patient Chief Complaint She is here for evaluation of a left great toe ulcer Electronic Signature(s) Signed: 02/22/2018 12:17:55 PM By: Lawanda Cousins Entered By: Lawanda Cousins on 02/22/2018 12:17:55 [name] ([id number]) -------------------------------------------------------------------------------- Debridement Details Patient Name: Heather Dear A. Date of Service: 02/22/2018 11:30 AM Medical Record Number: [id number] Patient Account Number: [id number] Date of Birth/Sex: [birth year] (42 y.o. F) Treating RN: Ahmed Prima Primary Care Provider: Suzan Garibaldi Other Clinician: Referring Provider: Suzan Garibaldi Treating Provider/Extender: Cathie Olden in Treatment: 5 Debridement Performed for Wound #1 Left Toe Great Assessment: Performed By: Physician Lawanda Cousins, NP Debridement Type: Debridement Severity of Tissue Pre Fat layer exposed Debridement: Pre-procedure Verification/Time Yes - 12:02 Out Taken: Start Time: 12:02 Pain Control: Lidocaine 4% Topical Solution Total Area Debrided (L x W): 1.2 (cm) x 1 (cm) = 1.2 (cm) Tissue and other material Viable, Non-Viable, Slough, Subcutaneous, Fibrin/Exudate, Slough debrided: Level: Skin/Subcutaneous Tissue Debridement Description: Excisional Instrument: Curette Bleeding: Minimum Hemostasis Achieved: Pressure End Time: 12:05 Procedural Pain: 0 Post Procedural Pain: 0 Response to Treatment: Procedure was tolerated well Post Debridement  Measurements of Total Wound Length: (cm) 1.2 Width: (cm) 1 Depth: (cm) 0.4 Volume: (cm) 0.377 Character of Wound/Ulcer Post Debridement: Requires Further Debridement Severity of Tissue Post Debridement: Fat layer exposed Post Procedure Diagnosis Same as Pre-procedure Electronic Signature(s) Signed: 02/22/2018 12:54:19 PM By: Lawanda Cousins Signed: 02/23/2018 4:40:49 PM By: Alric Quan Entered By: Alric Quan on 02/22/2018 12:05:28 [name] ([id number]) -------------------------------------------------------------------------------- HPI Details Patient Name: Heather Dear A. Date of Service: 02/22/2018 11:30 AM Medical Record Number: [id number] Patient Account Number: [id number] Date of Birth/Sex: 1976-08-21 (42 y.o. F) Treating RN: Ahmed Prima Primary Care Provider: Suzan Garibaldi Other Clinician: Referring Provider: Suzan Garibaldi Treating Provider/Extender: Cathie Olden in Treatment: 5 History of Present Illness HPI Description: 01/18/18-She is here for initial evaluation of the left great toe ulcer. She is a poor historian in regards to timeframe in detail. She states approximately 4 weeks ago she lacerated her toe on something in the house. She followed up with her primary care who placed her on Bactrim and ultimately a second dose of Bactrim prior to coming to wound clinic. She states she has been treating the toe with peroxide, Betadine and a Band-Aid. She did not check her blood sugar this morning but checked it yesterday morning it was 327; she is unaware of a recent A1c and there are no current records. She saw Dr. she would've orthopedics last week for an old injury to the left ankle, she states he did not see her toe, nor did she bring it to his attention. She smokes approximately 1 pack cigarettes a day. Her social situation is concerning, she arrives this morning with her mother who appears extremely intoxicated/under the influence; her mother was  asked to leave the room and be monitored by the patient's grandmother. The patient's aunt then accompanied the patient and the room throughout the rest of the appointment. We had a lengthy discussion regarding the deleterious effects of uncontrolled hyperglycemia and smoking as it relates to wound healing and overall health. She was strongly encouraged to decrease her  smoking and get her diabetes under better control. She states she is currently on a diet and has cut down her [facility] consumption. The left toe is erythematous, macerated and slightly edematous with malodor present. The edema in her left foot is below her baseline, there is no erythema streaking. We will treat her with Santyl, doxycycline; we have ordered and xray, culture and provided a Peg assist surgical shoe and cultured the wound. 01/25/18-She is here in follow-up evaluation for a left great toe ulcer and presents with an abscess to her suprapubic area. She states her blood sugars remain elevated, feeling "sick" and if levels are below 250, but she is trying. She has made no attempt to decrease her smoking stating that we "can't take away her food in her cigarettes". She has been compliant with offloading using the PEG assist you. She is using Santyl daily. the culture obtained last week grew staph aureus and Enterococcus faecalis; continues on the doxycycline and Augmentin was added on Monday. The suprapubic area has erythema, no femoral variation, purple discoloration, minimal induration, was accessed with a cotton tip applicator with sanguinopurulent drainage, this was cultured, I suspect the current antibiotic treatment will cover and we will not add anything to her current treatment plan. She was advised to go to urgent care or ER with any change in redness, induration or fever. 02/01/18-She is here in follow-up evaluation for left great toe ulcers and a new abdominal abscess from last week. She was able to use packing  until earlier this week, where she "forgot it was there". She states she was feeling ill with GI symptoms last week and was not taking her antibiotic. She states her glucose levels have been predominantly less than 200, with occasional levels between 200-250. She thinks this was contributing to her GI symptoms as they have resolved without intervention. There continues to be significant laceration to left toe, otherwise it clinically looks stable/improved. There is now less superficial opening to the lateral aspect of the great toe that was residual blister. We will transition to [facility] to all wounds, she will continue her Augmentin. If there is no change or deterioration next week for reculture. 02/08/18-She is here in follow-up evaluation for left great toe ulcer and abdominal ulcer. There is an improvement in both wounds. She has been wrapping her left toe with coban, not by our direction, which has created an area of discoloration to the medial aspect; she has been advised to NOT use coban secondary to her neuropathy. She states her glucose levels have been high over this last week ranging from 200-350, she continues to smoke. She admits to being less compliant with her offloading shoe. We will continue with same treatment plan and she will follow-up next week. 02/15/18-She is here in follow-up evaluation for left great toe ulcer and abdominal ulcer. The abdominal ulcer is epithelialized. The left great toe ulcer is improved and all injury from last week using the Coban wrap is resolved, the lateral ulcer is healed. She admits to noncompliance with wearing offloading shoe and admits to glucose levels being greater than 300 most of the week. She continues to smoke and expresses no desire to quit. There is one area medially that probes deeper than it has historically, erythema to the toe and dorsal foot has consistently waxed and waned. There is no overt signs of cellulitis or infection but  we will culture the wound for any occult infection given the new area of depth and erythema.  We will hold off on sensitivities for initiation of antibiotic therapy. 02/22/18-She is here in follow up evaluation for left great toe ulcer. There is overall significant improvement in both wound appearance, erythema and edema with changes made last week. She was not initiated on antibiotic therapy. Culture obtained last week showed oxacillin sensitive staph aureus, sensitive to clindamycin. Clindamycin has been called into the pharmacy but she has been instructed to hold off on initiation secondary to overall clinical improvement and her history of antibiotic intolerance. She has been instructed to contact the clinic with any noted changes/deterioration and the wound, erythema, [name], [name] ([id number]) edema and/or pain. She will follow-up next week. She continues to smoke and her glucose levels remain elevated >250; she admits to compliance with offloading shoe Electronic Signature(s) Signed: 02/22/2018 12:45:49 PM By: Lawanda Cousins Previous Signature: 02/22/2018 12:20:55 PM Version By: Lawanda Cousins Entered By: Lawanda Cousins on 02/22/2018 12:45:49 [name] ([id number]) -------------------------------------------------------------------------------- Physician Orders Details Patient Name: Heather Dear A. Date of Service: 02/22/2018 11:30 AM Medical Record Number: [id number] Patient Account Number: [id number] Date of Birth/Sex: 1975/12/17 (42 y.o. F) Treating RN: Ahmed Prima Primary Care Provider: Suzan Garibaldi Other Clinician: Referring Provider: Suzan Garibaldi Treating Provider/Extender: Cathie Olden in Treatment: 5 Verbal / Phone Orders: Yes Clinician: Pinkerton, Debi Read Back and Verified: Yes Diagnosis Coding Wound Cleansing Wound #1 Left Toe Great o Clean wound with Normal Saline. o Cleanse wound with mild soap and water o May Shower, gently pat wound dry prior  to applying new dressing. Anesthetic (add to Medication List) Wound #1 Left Toe Great o Topical Lidocaine 4% cream applied to wound bed prior to debridement (In Clinic Only). Primary Wound Dressing Wound #1 Left Toe Great o Hydrafera Blue Ready Transfer Secondary Dressing Wound #1 Left Toe Great o Dry Gauze o Conform/Kerlix Dressing Change Frequency Wound #1 Left Toe Great o Change dressing every other day. o Other: - as needed Follow-up Appointments Wound #1 Left Toe Great o Return Appointment in 1 week. Edema Control Wound #1 Left Toe Great o Elevate legs to the level of the heart and pump ankles as often as possible o Other: - Ace bandage Additional Orders / Instructions Wound #1 Left Toe Great o Stop Smoking o Increase protein intake. Patient Medications Allergies: erythromycin base [name], [name] ([id number]) Notifications Medication Indication Start End clindamycin HCl 02/22/2018 DOSE oral 300 mg capsule - capsule oral; one tablet/capsule by mouth every 8 hours (0600, 1400, 2200) for 7 days lidocaine DOSE 1 - topical 4 % cream - 1 cream topical Electronic Signature(s) Signed: 02/22/2018 12:54:19 PM By: Lawanda Cousins Signed: 02/23/2018 4:40:49 PM By: Alric Quan Previous Signature: 02/22/2018 8:35:03 AM Version By: Lawanda Cousins Entered By: Alric Quan on 02/22/2018 12:17:07 [name] ([id number]) -------------------------------------------------------------------------------- Prescription 02/22/2018 Patient Name: Heather Dear A. Provider: Lawanda Cousins NP Date of Birth: 1976/10/07 NPI#: [id number] Sex: F DEA#: [id number] Phone #: [phone] License #: Patient Address: [facility] [place], [place] [address] [address], [place], [place] [address] [phone] Allergies erythromycin base Medication Medication: Route: Strength:  Form: lidocaine 4 % topical cream topical 4% cream Class: TOPICAL LOCAL ANESTHETICS Dose: Frequency / Time: Indication: 1 1 cream topical Number of Refills: Number of Units: 0 Generic Substitution: Start Date: End Date: One Time Use: Substitution Permitted No Note to Pharmacy: Signature(s): Date(s): Electronic Signature(s) Signed: 02/22/2018 12:54:19 PM By: Lawanda Cousins Signed: 02/23/2018 4:40:49 PM By:  Alric Quan Entered By: Alric Quan on 02/22/2018 12:17:08 [name] ([id number]) --------------------------------------------------------------------------------  Problem List Details Patient Name: Heather Dear A. Date of Service: 02/22/2018 11:30 AM Medical Record Number: [id number] Patient Account Number: [id number] Date of Birth/Sex: 1976-06-11 (42 y.o. 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 Date of Service: 02/22/2018 11:30 AM Medical Record Number: [id number] Patient Account Number: [id number] Date of Birth/Sex: 14-Aug-1976 (42 y.o. F) Treating RN: Ahmed Prima Primary Care Provider: Suzan Garibaldi Other Clinician: Referring  Provider: Suzan Garibaldi Treating Provider/Extender: Cathie Olden in Treatment: 5 Subjective Chief Complaint Information obtained from Patient She is here for evaluation of a left great toe ulcer History of Present Illness (HPI) 01/18/18-She is here for initial evaluation of the left great toe ulcer. She is a poor historian in regards to timeframe in detail. She states approximately 4 weeks ago she lacerated her toe on something in the house. She followed up with her primary care who placed her on Bactrim and ultimately a second dose of Bactrim prior to coming to wound clinic. She states she has been treating the toe with peroxide, Betadine and a Band-Aid. She did not check her blood sugar this morning but checked it yesterday morning it was 327; she is unaware of a recent A1c and there are no current records. She saw Dr. she would've orthopedics last week for an old injury to the left ankle, she states he did not see her toe, nor did she bring it to his attention. She smokes approximately 1 pack cigarettes a day. Her social situation is concerning, she arrives this morning with her mother who appears extremely intoxicated/under the influence; her mother was asked to leave the room and be monitored by the patient's grandmother. The patient's aunt then accompanied the patient and the room throughout the rest of the appointment. We had a lengthy discussion regarding the deleterious effects of uncontrolled hyperglycemia and smoking as it relates to wound healing and overall health. She was strongly encouraged to decrease her smoking and get her diabetes under better control. She states she is currently on a diet and has cut down her [facility] consumption. The left toe is erythematous, macerated and slightly edematous with malodor present. The edema in her left foot is below her baseline, there is no erythema streaking. We will treat her with Santyl, doxycycline; we have ordered and xray,  culture and provided a Peg assist surgical shoe and cultured the wound. 01/25/18-She is here in follow-up evaluation for a left great toe ulcer and presents with an abscess to her suprapubic area. She states her blood sugars remain elevated, feeling "sick" and if levels are below 250, but she is trying. She has made no attempt to decrease her smoking stating that we "can't take away her food in her cigarettes". She has been compliant with offloading using the PEG assist you. She is using Santyl daily. the culture  obtained last week grew staph aureus and Enterococcus faecalis; continues on the doxycycline and Augmentin was added on Monday. The suprapubic area has erythema, no femoral variation, purple discoloration, minimal induration, was accessed with a cotton tip applicator with sanguinopurulent drainage, this was cultured, I suspect the current antibiotic treatment will cover and we will not add anything to her current treatment plan. She was advised to go to urgent care or ER with any change in redness, induration or fever. 02/01/18-She is here in follow-up evaluation for left great toe ulcers and a new abdominal abscess from last week. She was able to use packing until earlier this week, where she "forgot it was there". She states she was feeling ill with GI symptoms last week and was not taking her antibiotic. She states her glucose levels have been predominantly less than 200, with occasional levels between 200-250. She thinks this was contributing to her GI symptoms as they have resolved without intervention. There continues to be significant laceration to left toe, otherwise it clinically looks stable/improved. There is now less superficial opening to the lateral aspect of the great toe that was residual blister. We will transition to [facility] to all wounds, she will continue her Augmentin. If there is no change or deterioration next week for reculture. 02/08/18-She is here in follow-up  evaluation for left great toe ulcer and abdominal ulcer. There is an improvement in both wounds. She has been wrapping her left toe with coban, not by our direction, which has created an area of discoloration to the medial aspect; she has been advised to NOT use coban secondary to her neuropathy. She states her glucose levels have been high over this last week ranging from 200-350, she continues to smoke. She admits to being less compliant with her offloading shoe. We will continue with same treatment plan and she will follow-up next week. 02/15/18-She is here in follow-up evaluation for left great toe ulcer and abdominal ulcer. The abdominal ulcer is epithelialized. The left great toe ulcer is improved and all injury from last week using the Coban wrap is resolved, the lateral ulcer is healed. She admits to noncompliance with wearing offloading shoe and admits to glucose levels being greater than 300 most of the week. She continues to smoke and expresses no desire to quit. There is one area medially that probes deeper than it has historically, erythema to the toe and dorsal foot has consistently waxed and waned. There is no overt signs of cellulitis or infection but we will culture the wound for any occult infection given the new area of depth and erythema. We will hold off on Wollschlager, Katrese A. ([id number]) sensitivities for initiation of antibiotic therapy. 02/22/18-She is here in follow up evaluation for left great toe ulcer. There is overall significant improvement in both wound appearance, erythema and edema with changes made last week. She was not initiated on antibiotic therapy. Culture obtained last week showed oxacillin sensitive staph aureus, sensitive to clindamycin. Clindamycin has been called into the pharmacy but she has been instructed to hold off on initiation secondary to overall clinical improvement and her history of antibiotic intolerance. She has been instructed to contact the  clinic with any noted changes/deterioration and the wound, erythema, edema and/or pain. She will follow-up next week. She continues to smoke and her glucose levels remain elevated >250; she admits to compliance with offloading shoe Objective Constitutional Vitals Time Taken: 11:43 AM, Height: 69 in, Weight: 250 lbs, BMI: 36.9, Temperature: 98.2 F, Pulse: 90  bpm, Respiratory Rate: 18 breaths/min, Blood Pressure: 113/78 mmHg. Integumentary (Hair, Skin) Wound #1 status is Open. Original cause of wound was Gradually Appeared. The wound is located on the Left Toe Great. The wound measures 1.2cm length x 1cm width x 0.3cm depth; 0.942cm^2 area and 0.283cm^3 volume. There is Fat Layer (Subcutaneous Tissue) Exposed exposed. There is no tunneling or undermining noted. There is a small amount of serous drainage noted. The wound margin is flat and intact. There is large (67-100%) red, pink granulation within the wound bed. There is a small (1-33%) amount of necrotic tissue within the wound bed including Adherent Slough. The periwound skin appearance exhibited: Callus, Maceration, Erythema. The periwound skin appearance did not exhibit: Crepitus, Excoriation, Induration, Rash, Scarring, Dry/Scaly, Atrophie Blanche, Cyanosis, Ecchymosis, Hemosiderin Staining, Mottled, Pallor, Rubor. The surrounding wound skin color is noted with erythema. Periwound temperature was noted as No Abnormality. 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 There was a Excisional Skin/Subcutaneous Tissue Debridement with a total  area of 1.2 sq cm performed by Lawanda Cousins, NP. With the following instrument(s): Curette. to [name], [name] ([id number]) remove Viable and Non-Viable tissue/material Material removed includes Subcutaneous Tissue, and Slough, [place] after achieving pain control using Lidocaine 4% Topical Solution. No specimens were taken. A time out was conducted at 12:02, prior to the start of the procedure. A Minimum amount of bleeding was controlled with Pressure. The procedure was tolerated well with a pain level of 0 throughout and a pain level of 0 following the procedure. Post Debridement Measurements: 1.2cm length x 1cm width x 0.4cm depth; 0.377cm^3 volume. Character of Wound/Ulcer Post Debridement requires further debridement. Severity of Tissue Post Debridement is: Fat layer exposed. Post procedure Diagnosis Wound #1: Same as Pre-Procedure Plan Wound Cleansing: Wound #1 Left Toe Great: Clean wound with Normal Saline. Cleanse wound with mild soap and water May Shower, gently pat wound dry prior to applying new dressing. Anesthetic (add to Medication List): Wound #1 Left Toe Great: Topical Lidocaine 4% cream applied to wound bed prior to debridement (In Clinic Only). Primary Wound Dressing: Wound #1 Left Toe Great: Hydrafera Blue Ready Transfer Secondary Dressing: Wound #1 Left Toe Great: Dry Gauze Conform/Kerlix Dressing Change Frequency: Wound #1 Left Toe Great: Change dressing every other day. Other: - as needed Follow-up Appointments: Wound #1 Left Toe Great: Return Appointment in 1 week. Edema Control: Wound #1 Left Toe Great: Elevate legs to the level of the heart and pump ankles as often as possible Other: - Ace bandage Additional Orders / Instructions: Wound #1 Left Toe Great: Stop Smoking Increase protein intake. The following medication(s) was prescribed: clindamycin HCl oral 300 mg capsule capsule oral; one tablet/capsule by mouth every 8 hours  (0600, 1400, 2200) for 7 days starting 02/22/2018 lidocaine topical 4 % cream 1 1 cream topical was prescribed at facility 1. Clindamycin called into pharmacy, will initiate if needed Electronic Signature(s) [name], [name] ([id number]) Signed: 02/22/2018 12:46:24 PM By: Lawanda Cousins Previous Signature: 02/22/2018 12:25:45 PM Version By: Lawanda Cousins Entered By: Lawanda Cousins on 02/22/2018 12:46:24 [name] ([id number]) -------------------------------------------------------------------------------- SuperBill Details Patient Name:  [name], [name] Date of Service: 02/22/2018 Medical Record Number: [id number] Patient Account Number: [id number] Date of Birth/Sex: 1976/08/21 (42 y.o. 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

## 2018-03-01 ENCOUNTER — Encounter: Payer: Medicaid Other | Admitting: Physician Assistant

## 2018-03-01 DIAGNOSIS — E11621 Type 2 diabetes mellitus with foot ulcer: Secondary | ICD-10-CM | POA: Diagnosis not present

## 2018-03-05 NOTE — Progress Notes (Signed)
[name], [name] ([id number]) Visit Report for 03/01/2018 Chief Complaint Document Details Patient Name: [name], [name] Date of Service: 03/01/2018 11:30 AM Medical Record Number: [id number] Patient Account Number: [id number] Date of Birth/Sex: [birth year] (42 y.o. F) Treating RN: Ahmed Prima Primary Care Provider: Suzan Garibaldi Other Clinician: Referring Provider: Suzan Garibaldi Treating Provider/Extender: Melburn Hake, Chevelle Coulson Weeks in Treatment: 6 Information Obtained from: Patient Chief Complaint She is here for evaluation of a left great toe ulcer Electronic Signature(s) Signed: 03/02/2018 2:32:11 PM By: Worthy Keeler PA-C Entered By: Worthy Keeler on 03/01/2018 11:37:11 [name] ([id number]) -------------------------------------------------------------------------------- HPI Details Patient Name: Heather Dear A. Date of Service: 03/01/2018 11:30 AM Medical Record Number: [id number] Patient Account Number: [id number] Date of Birth/Sex: October 18, 1976 (42 y.o. F) Treating RN: Ahmed Prima Primary Care Provider: Suzan Garibaldi Other Clinician: Referring Provider: Suzan Garibaldi Treating Provider/Extender: Melburn Hake, Makel Mcmann Weeks in Treatment: 6 History of Present Illness HPI Description: 01/18/18-She is here for initial evaluation of the left great toe ulcer. She is a poor historian in regards to timeframe in detail. She states approximately 4 weeks ago she lacerated her toe on something in the house. She followed up with her primary care who placed her on Bactrim and ultimately a second dose of Bactrim prior to coming to wound clinic. She states she has been treating the toe with peroxide, Betadine and a Band-Aid. She did not check her blood sugar this morning but checked it yesterday morning it was 327; she is unaware of a recent A1c and there are no current records. She saw Dr. she would've orthopedics last week for an old injury to the left ankle, she states he did not see her  toe, nor did she bring it to his attention. She smokes approximately 1 pack cigarettes a day. Her social situation is concerning, she arrives this morning with her mother who appears extremely intoxicated/under the influence; her mother was asked to leave the room and be monitored by the patient's grandmother. The patient's aunt then accompanied the patient and the room throughout the rest of the appointment. We had a lengthy discussion regarding the deleterious effects of uncontrolled hyperglycemia and smoking as it relates to wound healing and overall health. She was strongly encouraged to decrease her smoking and get her diabetes under better control. She states she is currently on a diet and has cut down her [facility] consumption. The left toe is erythematous, macerated and slightly edematous with malodor present. The edema in her left foot is below her baseline, there is no erythema streaking. We will treat her with Santyl, doxycycline; we have ordered and xray, culture and provided a Peg assist surgical shoe and cultured the wound. 01/25/18-She is here in follow-up evaluation for a left great toe ulcer and presents with an abscess to her suprapubic area. She states her blood sugars remain elevated, feeling "sick" and if levels are below 250, but she is trying. She has made no attempt to decrease her smoking stating that we "can't take away her food in her cigarettes". She has been compliant with offloading using the PEG assist you. She is using Santyl daily. the culture obtained last week grew staph aureus and Enterococcus faecalis; continues on the doxycycline and Augmentin was added on Monday. The suprapubic area has erythema, no femoral variation, purple discoloration, minimal induration, was accessed with a cotton tip applicator with sanguinopurulent drainage, this was cultured, I suspect the current antibiotic treatment will cover and we will not add anything to  her current treatment  plan. She was advised to go to urgent care or ER with any change in redness, induration or fever. 02/01/18-She is here in follow-up evaluation for left great toe ulcers and a new abdominal abscess from last week. She was able to use packing until earlier this week, where she "forgot it was there". She states she was feeling ill with GI symptoms last week and was not taking her antibiotic. She states her glucose levels have been predominantly less than 200, with occasional levels between 200-250. She thinks this was contributing to her GI symptoms as they have resolved without intervention. There continues to be significant laceration to left toe, otherwise it clinically looks stable/improved. There is now less superficial opening to the lateral aspect of the great toe that was residual blister. We will transition to [name] to all wounds, she will continue her Augmentin. If there is no change or deterioration next week for reculture. 02/08/18-She is here in follow-up evaluation for left great toe ulcer and abdominal ulcer. There is an improvement in both wounds. She has been wrapping her left toe with coban, not by our direction, which has created an area of discoloration to the medial aspect; she has been advised to NOT use coban secondary to her neuropathy. She states her glucose levels have been high over this last week ranging from 200-350, she continues to smoke. She admits to being less compliant with her offloading shoe. We will continue with same treatment plan and she will follow-up next week. 02/15/18-She is here in follow-up evaluation for left great toe ulcer and abdominal ulcer. The abdominal ulcer is epithelialized. The left great toe ulcer is improved and all injury from last week using the Coban wrap is resolved, the lateral ulcer is healed. She admits to noncompliance with wearing offloading shoe and admits to glucose levels being greater than 300 most of the week. She continues  to smoke and expresses no desire to quit. There is one area medially that probes deeper than it has historically, erythema to the toe and dorsal foot has consistently waxed and waned. There is no overt signs of cellulitis or infection but we will culture the wound for any occult infection given the new area of depth and erythema. We will hold off on sensitivities for initiation of antibiotic therapy. 02/22/18-She is here in follow up evaluation for left great toe ulcer. There is overall significant improvement in both wound appearance, erythema and edema with changes made last week. She was not initiated on antibiotic therapy. Culture obtained last week showed oxacillin sensitive staph aureus, sensitive to clindamycin. Clindamycin has been called into the pharmacy but she has been instructed to hold off on initiation secondary to overall clinical improvement and her history of antibiotic intolerance. She has been instructed to contact the clinic with any noted changes/deterioration and the wound, erythema, [name], [name] ([id number]) edema and/or pain. She will follow-up next week. She continues to smoke and her glucose levels remain elevated >250; she admits to compliance with offloading shoe 03/01/18 on evaluation today patient appears to be doing fairly well in regard to her left first toe ulcer. She has been tolerating the dressing changes with the [facility] Dressing without complication and overall this has definitely showed signs of improvement according to records as well is what the patient tells me today. I'm very pleased in that regard. She is having no pain today Electronic Signature(s) Signed: 03/02/2018 2:32:11 PM By: Worthy Keeler PA-C Entered By:  Worthy Keeler on 03/01/2018 13:18:04 [name], [name] ([id number]) -------------------------------------------------------------------------------- Physical Exam Details Patient Name: [name], [name] Date of Service: 03/01/2018  11:30 AM Medical Record Number: [id number] Patient Account Number: [id number] Date of Birth/Sex: [birth year] (42 y.o. F) Treating RN: Ahmed Prima Primary Care Provider: Suzan Garibaldi Other Clinician: Referring Provider: Suzan Garibaldi Treating Provider/Extender: STONE III, Vella Colquitt Weeks in Treatment: 6 Constitutional Well-nourished and well-hydrated in no acute distress. Respiratory normal breathing without difficulty. clear to auscultation bilaterally. Cardiovascular regular rate and rhythm with normal S1, S2. Psychiatric this patient is able to make decisions and demonstrates good insight into disease process. Alert and Oriented x 3. pleasant and cooperative. Notes Patient's wound bed shows an excellent granulation surface with no evidence of infection at this point in time. There was no slough no sharp debridement was required. Electronic Signature(s) Signed: 03/02/2018 2:32:11 PM By: Worthy Keeler PA-C Entered By: Worthy Keeler on 03/01/2018 13:18:47 [name] ([id number]) -------------------------------------------------------------------------------- Physician Orders Details Patient Name: Heather Dear A. Date of Service: 03/01/2018 11:30 AM Medical Record Number: [id number] Patient Account Number: [id number] Date of Birth/Sex: [birth year] (42 y.o. 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 o Cleanse wound  with mild soap and water o May Shower, gently pat wound dry prior to applying new dressing. Anesthetic (add to Medication List) Wound #1 Left Toe Great o Topical Lidocaine 4% cream applied to wound bed prior to debridement (In Clinic Only). Primary Wound Dressing Wound #1 Left Toe Great o Hydrafera Blue Ready Transfer Secondary Dressing Wound #1 Left Toe Great o Dry Gauze o Conform/Kerlix Dressing Change Frequency Wound #1 Left Toe Great o Change dressing every other day. o Other: - as needed Follow-up Appointments Wound #1 Left Toe Great o Return Appointment in 1 week. Edema Control Wound #1 Left Toe Great o Elevate legs to the level of the heart and pump ankles as often as possible o Other: - Ace bandage [name], [name] ([id number]) Off-Loading Wound #1 Left Toe Great o Open toe surgical shoe with peg assist. Additional Orders / Instructions Wound #1 Left Toe Great o Stop Smoking o Increase protein intake. Patient Medications Allergies: erythromycin base Notifications Medication Indication Start End lidocaine DOSE 1 - topical 4 % cream - 1 cream topical Electronic Signature(s) Signed: 03/02/2018 2:32:11 PM By: Worthy Keeler PA-C Signed: 03/02/2018 4:00:06 PM By: Alric Quan Entered By: Alric Quan on 03/01/2018 12:07:07 [name] ([id number]) -------------------------------------------------------------------------------- Prescription 03/01/2018 Patient Name: Heather Dear A. Provider: Worthy Keeler PA-C Date of Birth: Aug 28, 1976 NPI#: [id number] Sex: F DEA#: [id number] Phone #: [phone] License #: Patient Address: [place], [place] [address] [address], [place], [place] [address] [phone] Allergies erythromycin base Medication Medication: Route: Strength: Form: lidocaine topical 4%  cream Class: TOPICAL LOCAL ANESTHETICS Dose: Frequency / Time: Indication: 1 1 cream topical Number of Refills: Number of Units: 0 Generic Substitution: Start Date: End Date: Administered at [place]:  Yes Time Administered: Time Discontinued: Note to Pharmacy: Signature(s): Date(s): Electronic Signature(s) Signed: 03/02/2018 2:32:11 PM By: Worthy Keeler PA-C Signed: 03/02/2018 4:00:06 PM By: Alric Quan Entered By: Alric Quan on 03/01/2018 12:07:08 [name] ([id number]) [name], [name] Kitchen ([id number]) --------------------------------------------------------------------------------  Problem List Details Patient Name: Heather Dear A. Date of Service: 03/01/2018 11:30 AM Medical Record Number: [id number] Patient Account Number: [id number] Date of Birth/Sex: 11/28/1975 (42 y.o. 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 ([id number]) -------------------------------------------------------------------------------- Progress Note Details Patient Name: Heather Dear A. Date of Service: 03/01/2018 11:30 AM Medical Record Number: [id number] Patient Account Number: [id number] Date of Birth/Sex: [birth year] (42 y.o. F) Treating RN: Ahmed Prima Primary Care  Provider: Suzan Garibaldi Other Clinician: Referring Provider: Suzan Garibaldi Treating Provider/Extender: Melburn Hake, Elisavet Buehrer Weeks in Treatment: 6 Subjective Chief Complaint Information obtained from Patient She is here for evaluation of a left great toe ulcer History of Present Illness (HPI) 01/18/18-She is here for initial evaluation of the left great toe ulcer. She is a poor historian in regards to timeframe in detail. She states approximately 4 weeks ago she lacerated her toe on something in the house. She followed up with her primary care who placed her on Bactrim and ultimately a second dose of Bactrim prior to coming to wound clinic. She states she has been treating the toe with peroxide, Betadine and a Band-Aid. She did not check her blood sugar this morning but checked it yesterday morning it was 327; she is unaware of a recent A1c and there are no current records. She saw Dr. she would've orthopedics last week for an old injury to the left ankle, she states he did not see her toe, nor did she bring it to his attention. She smokes approximately 1 pack cigarettes a day. Her social situation is concerning, she arrives this morning with her mother who appears extremely intoxicated/under the influence; her mother was asked to leave the room and be monitored by the patient's grandmother. The patient's aunt then accompanied the patient and the room throughout the rest of the appointment. We had a lengthy discussion regarding the deleterious effects of uncontrolled hyperglycemia and smoking as it relates to wound healing and overall health. She was strongly encouraged to decrease her smoking and get her diabetes under better control. She states she is currently on a diet and has cut down her [facility] consumption. The left toe is erythematous, macerated and slightly edematous with malodor present. The edema in her left foot is below her baseline, there is no erythema streaking. We will treat her  with Santyl, doxycycline; we have ordered and xray, culture and provided a Peg assist surgical shoe and cultured the wound. 01/25/18-She is here in follow-up evaluation for a left great toe ulcer and presents with an abscess to her suprapubic area. She states her blood sugars remain elevated, feeling "sick" and if levels are below 250, but she is trying. She has made  no attempt to decrease her smoking stating that we "can't take away her food in her cigarettes". She has been compliant with offloading using the PEG assist you. She is using Santyl daily. the culture obtained last week grew staph aureus and Enterococcus faecalis; continues on the doxycycline and Augmentin was added on Monday. The suprapubic area has erythema, no femoral variation, purple discoloration, minimal induration, was accessed with a cotton tip applicator with sanguinopurulent drainage, this was cultured, I suspect the current antibiotic treatment will cover and we will not add anything to her current treatment plan. She was advised to go to urgent care or ER with any change in redness, induration or fever. 02/01/18-She is here in follow-up evaluation for left great toe ulcers and a new abdominal abscess from last week. She was able to use packing until earlier this week, where she "forgot it was there". She states she was feeling ill with GI symptoms last week and was not taking her antibiotic. She states her glucose levels have been predominantly less than 200, with occasional levels between 200-250. She thinks this was contributing to her GI symptoms as they have resolved without intervention. There continues to be significant laceration to left toe, otherwise it clinically looks stable/improved. There is now less superficial opening to the lateral aspect of the great toe that was residual blister. We will transition to [facility] to all wounds, she will continue her Augmentin. If there is no change or deterioration next  week for reculture. 02/08/18-She is here in follow-up evaluation for left great toe ulcer and abdominal ulcer. There is an improvement in both wounds. She has been wrapping her left toe with coban, not by our direction, which has created an area of discoloration to the medial aspect; she has been advised to NOT use coban secondary to her neuropathy. She states her glucose levels have been high over this last week ranging from 200-350, she continues to smoke. She admits to being less compliant with her offloading shoe. We will continue with same treatment plan and she will follow-up next week. 02/15/18-She is here in follow-up evaluation for left great toe ulcer and abdominal ulcer. The abdominal ulcer is epithelialized. The left great toe ulcer is improved and all injury from last week using the Coban wrap is resolved, the lateral ulcer is healed. She admits to noncompliance with wearing offloading shoe and admits to glucose levels being greater than 300 most of the week. She continues to smoke and expresses no desire to quit. There is one area medially that probes deeper than it has historically, erythema to the toe and dorsal foot has consistently waxed and waned. There is no overt signs of cellulitis or infection but we will culture the wound for any occult infection given the new area of depth and erythema. We will hold off on [name], [name] ([id number]) sensitivities for initiation of antibiotic therapy. 02/22/18-She is here in follow up evaluation for left great toe ulcer. There is overall significant improvement in both wound appearance, erythema and edema with changes made last week. She was not initiated on antibiotic therapy. Culture obtained last week showed oxacillin sensitive staph aureus, sensitive to clindamycin. Clindamycin has been called into the pharmacy but she has been instructed to hold off on initiation secondary to overall clinical improvement and her history of  antibiotic intolerance. She has been instructed to contact the clinic with any noted changes/deterioration and the wound, erythema, edema and/or pain. She will follow-up next week. She continues to  smoke and her glucose levels remain elevated >250; she admits to compliance with offloading shoe 03/01/18 on evaluation today patient appears to be doing fairly well in regard to her left first toe ulcer. She has been tolerating the dressing changes with the [facility] Dressing without complication and overall this has definitely showed signs of improvement according to records as well is what the patient tells me today. I'm very pleased in that regard. She is having no pain today Patient History Information obtained from Patient. Family History Cancer - Paternal Grandparents, Heart Disease - Paternal Grandparents, Hypertension - Paternal Grandparents, No family history of Diabetes, Hereditary Spherocytosis, Kidney Disease, Lung Disease, Seizures, Stroke, Thyroid Problems, Tuberculosis. Social History Current every day smoker, Marital Status - Single, Alcohol Use - Never, Drug Use - Prior History, Caffeine Use - Daily. Review of Systems (ROS) Constitutional Symptoms (General Health) Denies complaints or symptoms of Fever, Chills. Respiratory The patient has no complaints or symptoms. Cardiovascular The patient has no complaints or symptoms. Psychiatric The patient has no complaints or symptoms. Objective Constitutional Well-nourished and well-hydrated in no acute distress. Vitals Time Taken: 11:37 AM, Height: 69 in, Weight: 250 lbs, BMI: 36.9, Temperature: 98.6 F, Pulse: 85 bpm, Respiratory Rate: 18 breaths/min, Blood Pressure: 90/61 mmHg. Respiratory normal breathing without difficulty. clear to auscultation bilaterally. Cardiovascular regular rate and rhythm with normal S1, S2. [name], [name] ([id number]) Psychiatric this patient is able to make decisions and demonstrates good  insight into disease process. Alert and Oriented x 3. pleasant and cooperative. General Notes: Patient's wound bed shows an excellent granulation surface with no evidence of infection at this point in time. There was no slough no sharp debridement was required. Integumentary (Hair, Skin) Wound #1 status is Open. Original cause of wound was Gradually Appeared. The wound is located on the Left Toe Great. The wound measures 0.9cm length x 0.8cm width x 0.2cm depth; 0.565cm^2 area and 0.113cm^3 volume. There is Fat Layer (Subcutaneous Tissue) Exposed exposed. There is no tunneling or undermining noted. There is a small amount of serous drainage noted. The wound margin is flat and intact. There is large (67-100%) red, pink granulation within the wound bed. There is a small (1-33%) amount of necrotic tissue within the wound bed including Adherent Slough. The periwound skin appearance exhibited: Callus, Erythema. The periwound skin appearance did not exhibit: Crepitus, Excoriation, Induration, Rash, Scarring, Dry/Scaly, Maceration, Atrophie Blanche, Cyanosis, Ecchymosis, Hemosiderin Staining, Mottled, Pallor, Rubor. The surrounding wound skin color is noted with erythema. Periwound temperature was noted as No Abnormality. 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 Cleanse wound with mild soap and water May Shower, gently pat wound dry prior to applying new dressing. Anesthetic (add to Medication List): Wound #1 Left Toe Great: Topical Lidocaine 4% cream applied to wound bed prior to debridement (In Clinic Only). Primary Wound Dressing: Wound #1 Left Toe Great: Hydrafera Blue Ready Transfer Secondary  Dressing: Wound #1 Left Toe Great: Dry Gauze Conform/Kerlix Dressing Change Frequency: Wound #1 Left Toe Great: Change dressing every other day. Other: - as needed Follow-up Appointments: [name], [name] ([id number]) Wound #1 Left Toe Great: Return Appointment in 1 week. Edema Control: Wound #1 Left Toe Great: Elevate legs to the level of the  heart and pump ankles as often as possible Other: - Ace bandage Off-Loading: Wound #1 Left Toe Great: Open toe surgical shoe with peg assist. Additional Orders / Instructions: Wound #1 Left Toe Great: Stop Smoking Increase protein intake. The following medication(s) was prescribed: lidocaine topical 4 % cream 1 1 cream topical was prescribed at facility I'm gonna suggest currently that we continue with the current wound care measures since she seems to be doing so well. Patient is in agreement with this plan. We will subsequently see were things stand at follow-up in one weeks time. Please see above for specific wound care orders. We will see patient for re-evaluation in 1 week(s) here in the clinic. If anything worsens or changes patient will contact our office for additional recommendations. Electronic Signature(s) Signed: 03/02/2018 2:32:11 PM By: Worthy Keeler PA-C Entered By: Worthy Keeler on 03/01/2018 13:18:58 [name] ([id number]) -------------------------------------------------------------------------------- ROS/PFSH Details Patient Name: Heather Dear A. Date of Service: 03/01/2018 11:30 AM Medical Record Number: [id number] Patient Account Number: [id number] Date of Birth/Sex: [birth year] (42 y.o. F) Treating RN: Ahmed Prima Primary Care Provider: Suzan Garibaldi Other Clinician: Referring Provider: Suzan Garibaldi Treating Provider/Extender: Melburn Hake, Shreya Lacasse Weeks in Treatment: 6 Information Obtained From Patient Wound History Do you currently have one or more open woundso Yes How many open wounds do you  currently haveo 1 Approximately how long have you had your woundso 1 month How have you been treating your wound(s) until nowo open to air Has your wound(s) ever healed and then re-openedo No Have you had any lab work done in the past montho No Have you tested positive for an antibiotic resistant organism (MRSA, VRE)o No Have you tested positive for osteomyelitis (bone infection)o No Have you had any tests for circulation on your legso No Constitutional Symptoms (General Health) Complaints and Symptoms: Negative for: Fever; Chills Hematologic/Lymphatic Medical History: Negative for: Anemia; Hemophilia; Human Immunodeficiency Virus; Lymphedema; Sickle Cell Disease Respiratory Complaints and Symptoms: No Complaints or Symptoms Medical History: Positive for: Chronic Obstructive Pulmonary Disease (COPD) Negative for: Aspiration; Asthma; Pneumothorax; Sleep Apnea; Tuberculosis Cardiovascular Complaints and Symptoms: No Complaints or Symptoms Medical History: Positive for: Congestive Heart Failure; Hypertension Negative for: Angina; Arrhythmia; Coronary Artery Disease; Deep Vein Thrombosis; Hypotension; Myocardial Infarction; Peripheral Arterial Disease; Peripheral Venous Disease; Phlebitis; Vasculitis Gastrointestinal Medical History: Negative for: Cirrhosis ; Colitis; Crohnos; Hepatitis A; Hepatitis B; Hepatitis C Endocrine Ohlsen, Willia A. ([id number]) Medical History: Positive for: Type II Diabetes Treated with: Insulin, Oral agents Genitourinary Medical History: Negative for: End Stage Renal Disease Immunological Medical History: Negative for: Lupus Erythematosus; Raynaudos; Scleroderma Musculoskeletal Medical History: Negative for: Gout; Rheumatoid Arthritis; Osteoarthritis; Osteomyelitis Neurologic Medical History: Positive for: Neuropathy; Seizure Disorder Oncologic Medical History: Negative for: Received Chemotherapy; Received Radiation Psychiatric Complaints and  Symptoms: No Complaints or Symptoms Immunizations Pneumococcal Vaccine: Received Pneumococcal Vaccination: Yes Immunization Notes: up to date Implantable Devices Family and Social History Cancer: Yes - Paternal Grandparents; Diabetes: No; Heart Disease: Yes - Paternal Grandparents; Hereditary Spherocytosis: No; Hypertension: Yes - Paternal Grandparents; Kidney Disease: No; Lung Disease: No; Seizures: No; Stroke: No; Thyroid Problems: No; Tuberculosis: No; Current every day smoker; Marital Status - Single; Alcohol Use: Never; Drug Use: Prior History; Caffeine Use: Daily; Financial Concerns: No; Food, Clothing or Shelter Needs: No; Support System Lacking: No; Transportation Concerns: No; Advanced Directives: No; Patient does not want information on Advanced Directives Physician Affirmation I have reviewed and agree with the above information. Electronic Signature(s) Signed: 03/02/2018 2:32:11 PM By: Joaquim Lai  Dixie Dials PA-C Signed: 03/02/2018 4:00:06 PM By: Alric Quan Entered By: Worthy Keeler on 03/01/2018 13:18:31 [name] ([id number] [name], Eliese [name] Kitchen ([id number]) -------------------------------------------------------------------------------- SuperBill Details Patient Name: Heather Dear A. Date of Service: 03/01/2018 Medical Record Number: [id number] Patient Account Number: [id number] Date of Birth/Sex: December 04, 1975 (42 y.o. 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 L03.032 Cellulitis of left toe L02.211 Cutaneous abscess of abdominal wall Facility Procedures CPT4 Code: [id number] Description: 99213 - WOUND CARE VISIT-LEV 3 EST  PT Modifier: Quantity: 1 Physician Procedures CPT4 Code Description: 2527129 99213 - WC PHYS LEVEL 3 - EST PT ICD-10 Diagnosis Description 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

## 2018-03-05 NOTE — Progress Notes (Signed)
ELVI, LEVENTHAL ([id number]) Visit Report for 03/01/2018 Arrival Information Details Patient Name: [name], [name] Date of Service: 03/01/2018 11:30 AM Medical Record Number: [id number] Patient Account Number: [id number] Date of Birth/Sex: 07-24-1976 (42 y.o. F) Treating RN: Montey Hora Primary Care Arianis Bowditch: Suzan Garibaldi Other Clinician: Referring Della Scrivener: Suzan Garibaldi Treating Tajha Sammarco/Extender: Melburn Hake, HOYT Weeks in Treatment: 6 Visit Information History Since Last Visit Added or deleted any medications: No Patient Arrived: Wheel Chair Any new allergies or adverse reactions: No Arrival Time: 11:36 Had a fall or experienced change in No Accompanied By: mother activities of daily living that may affect Transfer Assistance: None risk of falls: Patient Identification Verified: Yes Signs or symptoms of abuse/neglect since last visito No Secondary Verification Process Completed: Yes Hospitalized since last visit: No Patient Has Alerts: Yes Implantable device outside of the clinic excluding No Patient Alerts: DMII cellular tissue based products placed in the center since last visit: Has Dressing in Place as Prescribed: Yes Pain Present Now: No Electronic Signature(s) Signed: 03/01/2018 4:34:09 PM By: Montey Hora Entered By: Montey Hora on 03/01/2018 11:37:05 [name] ([id number]) -------------------------------------------------------------------------------- Clinic Level of Care Assessment Details Patient Name: Heather Dear A. Date of Service: 03/01/2018 11:30 AM Medical Record Number: [id number] Patient Account Number: [id number] Date of Birth/Sex: 07-27-1976 (42 y.o. F) Treating RN: Ahmed Prima Primary Care Gurshan Settlemire: Suzan Garibaldi Other Clinician: Referring Kenesha Moshier: Suzan Garibaldi Treating Ilario Dhaliwal/Extender: Melburn Hake, HOYT Weeks in Treatment: 6 Clinic Level of Care Assessment Items TOOL 4 Quantity Score X - Use when only an EandM is performed on  FOLLOW-UP visit 1 0 ASSESSMENTS - Nursing Assessment / Reassessment X - Reassessment of Co-morbidities (includes updates in patient status) 1 10 X- 1 5 Reassessment of Adherence to Treatment Plan ASSESSMENTS - Wound and Skin Assessment / Reassessment X - Simple Wound Assessment / Reassessment - one wound 1 5 []  - 0 Complex Wound Assessment / Reassessment - multiple wounds []  - 0 Dermatologic / Skin Assessment (not related to wound area) ASSESSMENTS - Focused Assessment []  - Circumferential Edema Measurements - multi extremities 0 []  - 0 Nutritional Assessment / Counseling / Intervention []  - 0 Lower Extremity Assessment (monofilament, tuning fork, pulses) []  - 0 Peripheral Arterial Disease Assessment (using hand held doppler) ASSESSMENTS - Ostomy and/or Continence Assessment and Care []  - Incontinence Assessment and Management 0 []  - 0 Ostomy Care Assessment and Management (repouching, etc.) PROCESS - Coordination of Care X - Simple Patient / Family Education for ongoing care 1 15 []  - 0 Complex (extensive) Patient / Family Education for ongoing care []  - 0 Staff obtains Programmer, systems, Records, Test Results / Process Orders []  - 0 Staff telephones HHA, Nursing Homes / Clarify orders / etc []  - 0 Routine Transfer to another Facility (non-emergent condition) []  - 0 Routine Hospital Admission (non-emergent condition) []  - 0 New Admissions / Biomedical engineer / Ordering NPWT, Apligraf, etc. []  - 0 Emergency Hospital Admission (emergent condition) X- 1 10 Simple Discharge Coordination Carlino, Khristy A. ([id number]) []  - 0 Complex (extensive) Discharge Coordination PROCESS - Special Needs []  - Pediatric / Minor Patient Management 0 []  - 0 Isolation Patient Management []  - 0 Hearing / Language / Visual special needs []  - 0 Assessment of Community assistance (transportation, D/C planning, etc.) []  - 0 Additional assistance / Altered mentation []  - 0 Support Surface(s)  Assessment (bed, cushion, seat, etc.) INTERVENTIONS - Wound Cleansing / Measurement X - Simple Wound Cleansing - one wound 1 5 []  - 0 Complex Wound  Cleansing - multiple wounds X- 1 5 Wound Imaging (photographs - any number of wounds) []  - 0 Wound Tracing (instead of photographs) X- 1 5 Simple Wound Measurement - one wound []  - 0 Complex Wound Measurement - multiple wounds INTERVENTIONS - Wound Dressings X - Small Wound Dressing one or multiple wounds 1 10 []  - 0 Medium Wound Dressing one or multiple wounds []  - 0 Large Wound Dressing one or multiple wounds X- 1 5 Application of Medications - topical []  - 0 Application of Medications - injection INTERVENTIONS - Miscellaneous []  - External ear exam 0 []  - 0 Specimen Collection (cultures, biopsies, blood, body fluids, etc.) []  - 0 Specimen(s) / Culture(s) sent or taken to Lab for analysis []  - 0 Patient Transfer (multiple staff / Civil Service fast streamer / Similar devices) []  - 0 Simple Staple / Suture removal (25 or less) []  - 0 Complex Staple / Suture removal (26 or more) []  - 0 Hypo / Hyperglycemic Management (close monitor of Blood Glucose) []  - 0 Ankle / Brachial Index (ABI) - do not check if billed separately X- 1 5 Vital Signs Duma, Chelse A. ([id number]) Has the patient been seen at the hospital within the last three years: Yes Total Score: 80 Level Of Care: New/Established - Level 3 Electronic Signature(s) Signed: 03/02/2018 4:00:06 PM By: Alric Quan Entered By: Alric Quan on 03/01/2018 17:12:03 [name] ([id number]) -------------------------------------------------------------------------------- Encounter Discharge Information Details Patient Name: Heather Dear A. Date of Service: 03/01/2018 11:30 AM Medical Record Number: [id number] Patient Account Number: [id number] Date of Birth/Sex: October 12, 1976 (42 y.o. F) Treating RN: Cornell Barman Primary Care Miroslav Gin: Suzan Garibaldi Other Clinician: Referring  Shaton Lore: Suzan Garibaldi Treating Tuyen Uncapher/Extender: Melburn Hake, HOYT Weeks in Treatment: 6 Encounter Discharge Information Items Discharge Condition: Stable Ambulatory Status: Ambulatory Discharge Destination: Home Transportation: Private Auto Accompanied By: mom Schedule Follow-up Appointment: Yes Clinical Summary of Care: Electronic Signature(s) Signed: 03/01/2018 5:41:36 PM By: Gretta Cool, BSN, RN, CWS, [name] RN, BSN Entered By: Gretta Cool, BSN, RN, CWS, [name] on 03/01/2018 12:18:59 [name] ([id number]) -------------------------------------------------------------------------------- Lower Extremity Assessment Details Patient Name: Heather Dear A. Date of Service: 03/01/2018 11:30 AM Medical Record Number: [id number] Patient Account Number: [id number] Date of Birth/Sex: 1976/01/31 (42 y.o. F) Treating RN: Montey Hora Primary Care Charissa Knowles: Suzan Garibaldi Other Clinician: Referring Kalinda Romaniello: Suzan Garibaldi Treating Orit Sanville/Extender: Melburn Hake, HOYT Weeks in Treatment: 6 Edema Assessment Assessed: [Left: No] [Right: No] Edema: [Left: N] [Right: o] Vascular Assessment Pulses: Dorsalis Pedis Palpable: [Left:Yes] Posterior Tibial Extremity colors, hair growth, and conditions: Extremity Color: [Left:Normal] Hair Growth on Extremity: [Left:Yes] Temperature of Extremity: [Left:Warm] Capillary Refill: [Left:< 3 seconds] Toe Nail Assessment Left: Right: Thick: Yes Discolored: Yes Deformed: No Improper Length and Hygiene: No Electronic Signature(s) Signed: 03/01/2018 4:34:09 PM By: Montey Hora Entered By: Montey Hora on 03/01/2018 11:43:01 Schriefer, Erla A. ([id number]) -------------------------------------------------------------------------------- Multi Wound Chart Details Patient Name: Heather Dear A. Date of Service: 03/01/2018 11:30 AM Medical Record Number: [id number] Patient Account Number: [id number] Date of Birth/Sex: November 23, 1975 (42 y.o. F) Treating RN: Ahmed Prima Primary Care [place] Dodgen: Suzan Garibaldi Other Clinician: Referring Welford Christmas: Suzan Garibaldi Treating Frazier Balfour/Extender: STONE III, HOYT Weeks in Treatment: 6 Vital Signs Height(in): 69 Pulse(bpm): 85 Weight(lbs): 250 Blood Pressure(mmHg): 90/61 Body Mass Index(BMI): 37 Temperature(F): 98.6 Respiratory Rate 18 (breaths/min): Photos: [1:No Photos] [N/A:N/A] Wound Location: [1:Left Toe Great] [N/A:N/A] Wounding Event: [1:Gradually Appeared] [N/A:N/A] Primary Etiology: [1:Diabetic Wound/Ulcer of the N/A Lower Extremity] Comorbid History: [1:Chronic Obstructive Pulmonary Disease (COPD), Congestive Heart Failure, Hypertension, Type II  Diabetes, Neuropathy, Seizure Disorder] [N/A:N/A] Date Acquired: [1:01/01/2018] [N/A:N/A] Weeks of Treatment: [1:6] [N/A:N/A] Wound Status: [1:Open] [N/A:N/A] Pending Amputation on [1:Yes] [N/A:N/A] Presentation: Measurements L x W x D [1:0.9x0.8x0.2] [N/A:N/A] (cm) Area (cm) : [1:0.565] [N/A:N/A] Volume (cm) : [1:0.113] [N/A:N/A] % Reduction in Area: [1:92.60%] [N/A:N/A] % Reduction in Volume: [1:96.30%] [N/A:N/A] Classification: [1:Grade 2] [N/A:N/A] Exudate Amount: [1:Small] [N/A:N/A] Exudate Type: [1:Serous] [N/A:N/A] Exudate Color: [1:amber] [N/A:N/A] Wound Margin: [1:Flat and Intact] [N/A:N/A] Granulation Amount: [1:Large (67-100%)] [N/A:N/A] Granulation Quality: [1:Red, Pink] [N/A:N/A] Necrotic Amount: [1:Small (1-33%)] [N/A:N/A] Exposed Structures: [1:Fat Layer (Subcutaneous Tissue) Exposed: Yes Fascia: No Tendon: No Muscle: No Joint: No Bone: No] [N/A:N/A] Epithelialization: None N/A N/A Periwound Skin Texture: Callus: Yes N/A N/A Excoriation: No Induration: No Crepitus: No Rash: No Scarring: No Periwound Skin Moisture: Maceration: No N/A N/A Dry/Scaly: No Periwound Skin Color: Erythema: Yes N/A N/A Atrophie Blanche: No Cyanosis: No Ecchymosis: No Hemosiderin Staining: No Mottled: No Pallor: No Rubor: No Temperature:  No Abnormality N/A N/A Tenderness on Palpation: No N/A N/A Wound Preparation: Ulcer Cleansing: N/A N/A Rinsed/Irrigated with Saline Topical Anesthetic Applied: Other: lidocaine 4% Treatment Notes Electronic Signature(s) Signed: 03/02/2018 4:00:06 PM By: Alric Quan Entered By: Alric Quan on 03/01/2018 12:04:30 [name] ([id number]) -------------------------------------------------------------------------------- [place] Details Patient Name: Heather Dear A. Date of Service: 03/01/2018 11:30 AM Medical Record Number: [id number] Patient Account Number: [id number] Date of Birth/Sex: 1975/11/06 (42 y.o. F) Treating RN: Ahmed Prima Primary Care Zaydah Nawabi: Suzan Garibaldi Other Clinician: Referring Mariadelcarmen Corella: Suzan Garibaldi Treating Aqib Lough/Extender: Melburn Hake, HOYT Weeks in Treatment: 6 Active Inactive ` Abuse / Safety / Falls / Self Care Management Nursing Diagnoses: Potential for falls Goals: Patient will not experience any injury related to falls Date Initiated: 01/18/2018 Target Resolution Date: 04/28/2018 Goal Status: Active Interventions: Assess fall risk on admission and as needed Assess: immobility, friction, shearing, incontinence upon admission and as needed Assess impairment of mobility on admission and as needed per policy Assess personal safety and home safety (as indicated) on admission and as needed Assess self care needs on admission and as needed Notes: ` Nutrition Nursing Diagnoses: Imbalanced nutrition Impaired glucose control: actual or potential Potential for alteratiion in Nutrition/Potential for imbalanced nutrition Goals: Patient/caregiver agrees to and verbalizes understanding of need to use nutritional supplements and/or vitamins as prescribed Date Initiated: 01/18/2018 Target Resolution Date: 04/28/2018 Goal Status: Active Patient/caregiver will maintain therapeutic glucose control Date Initiated:  01/18/2018 Target Resolution Date: 04/28/2018 Goal Status: Active Interventions: Assess patient nutrition upon admission and as needed per policy Provide education on elevated blood sugars and impact on wound healing Notes: CINA, KLUMPP ([id number]) Orientation to the Wound Care Program Nursing Diagnoses: Knowledge deficit related to the wound healing center program Goals: Patient/caregiver will verbalize understanding of the [place] Date Initiated: 01/18/2018 Target Resolution Date: 01/27/2018 Goal Status: Active Interventions: Provide education on orientation to the wound center Notes: ` Wound/Skin Impairment Nursing Diagnoses: Impaired tissue integrity Knowledge deficit related to smoking impact on wound healing Knowledge deficit related to ulceration/compromised skin integrity Goals: Ulcer/skin breakdown will have a volume reduction of 80% by week 12 Date Initiated: 01/18/2018 Target Resolution Date: 04/21/2018 Goal Status: Active Interventions: Assess patient/caregiver ability to perform ulcer/skin care regimen upon admission and as needed Assess ulceration(s) every visit Notes: Electronic Signature(s) Signed: 03/02/2018 4:00:06 PM By: Alric Quan Entered By: Alric Quan on 03/01/2018 12:04:18 [name] ([id number]) -------------------------------------------------------------------------------- Pain Assessment Details Patient Name: Heather Dear A. Date of Service: 03/01/2018 11:30 AM Medical  Record Number: [id number] Patient Account Number: [id number] Date of Birth/Sex: 1976-09-25 (41 y.o. F) Treating RN: Montey Hora Primary Care Marsia Cino: Suzan Garibaldi Other Clinician: Referring Nilda Keathley: Suzan Garibaldi Treating Donte [name]/Extender: Melburn Hake, HOYT Weeks in Treatment: 6 Active Problems Location of Pain Severity and Description of Pain Patient Has Paino No Site Locations Pain Management and Medication Current Pain  Management: Electronic Signature(s) Signed: 03/01/2018 4:34:09 PM By: Montey Hora Entered By: Montey Hora on 03/01/2018 11:37:11 [name] ([id number]) -------------------------------------------------------------------------------- Patient/Caregiver Education Details Patient Name: Heather Dear A. Date of Service: 03/01/2018 11:30 AM Medical Record Number: [id number] Patient Account Number: [id number] Date of Birth/Gender: May 10, 1976 (42 y.o. F) Treating RN: Cornell Barman Primary Care Physician: Suzan Garibaldi Other Clinician: Referring Physician: Suzan Garibaldi Treating Physician/Extender: Sharalyn Ink in Treatment: 6 Education Assessment Education Provided To: Patient and Caregiver Education Topics Provided Wound/Skin Impairment: Handouts: Caring for Your Ulcer, Other: wound care as prescribed Methods: Demonstration, Explain/Verbal Responses: State content correctly Electronic Signature(s) Signed: 03/01/2018 5:41:36 PM By: Gretta Cool, BSN, RN, CWS, [name] RN, BSN Entered By: Gretta Cool, BSN, RN, CWS, [name] on 03/01/2018 12:19:25 [name] ([id number]) -------------------------------------------------------------------------------- Wound Assessment Details Patient Name: Heather Dear A. Date of Service: 03/01/2018 11:30 AM Medical Record Number: [id number] Patient Account Number: [id number] Date of Birth/Sex: Jun 07, 1976 (42 y.o. F) Treating RN: Montey Hora Primary Care Momoko Slezak: Suzan Garibaldi Other Clinician: Referring Rebecah Dangerfield: Suzan Garibaldi Treating Eunie Lawn/Extender: STONE III, HOYT Weeks in Treatment: 6 Wound Status Wound Number: 1 Primary Diabetic Wound/Ulcer of the Lower Extremity Etiology: Wound Location: Left Toe Great Wound Open Wounding Event: Gradually Appeared Status: Date Acquired: 01/01/2018 Comorbid Chronic Obstructive Pulmonary Disease Weeks Of Treatment: 6 History: (COPD), Congestive Heart Failure, Clustered Wound: No Hypertension, Type II  Diabetes, Neuropathy, Pending Amputation On Presentation Seizure Disorder Photos Photo Uploaded By: Gretta Cool, BSN, RN, CWS, [name] on 03/01/2018 16:17:44 Wound Measurements Length: (cm) 0.9 Width: (cm) 0.8 Depth: (cm) 0.2 Area: (cm) 0.565 Volume: (cm) 0.113 % Reduction in Area: 92.6% % Reduction in Volume: 96.3% Epithelialization: None Tunneling: No Undermining: No Wound Description Classification: Grade 2 Foul Odor Wound Margin: Flat and Intact Slough/Fi Exudate Amount: Small Exudate Type: Serous Exudate Color: amber After Cleansing: No brino No Wound Bed Granulation Amount: Large (67-100%) Exposed Structure Granulation Quality: Red, Pink Fascia Exposed: No Necrotic Amount: Small (1-33%) Fat Layer (Subcutaneous Tissue) Exposed: Yes Necrotic Quality: Adherent Slough Tendon Exposed: No Muscle Exposed: No Joint Exposed: No Bone Exposed: No Head, Marilla A. ([id number]) Periwound Skin Texture Texture Color No Abnormalities Noted: No No Abnormalities Noted: No Callus: Yes Atrophie Blanche: No Crepitus: No Cyanosis: No Excoriation: No Ecchymosis: No Induration: No Erythema: Yes Rash: No Hemosiderin Staining: No Scarring: No Mottled: No Pallor: No Moisture Rubor: No No Abnormalities Noted: No Dry / Scaly: No Temperature / Pain Maceration: No Temperature: No Abnormality Wound Preparation Ulcer Cleansing: Rinsed/Irrigated with Saline Topical Anesthetic Applied: Other: lidocaine 4%, Treatment Notes Wound #1 (Left Toe Great) 1. Cleansed with: Clean wound with Normal Saline 2. Anesthetic Topical Lidocaine 4% cream to wound bed prior to debridement 4. Dressing Applied: Hydrafera Blue 5. Secondary Dressing Applied Gauze and Kerlix/Conform 6. Footwear/Offloading device applied Surgical shoe 7. Secured with Paper tape Notes Ace wrap applied Electronic Signature(s) Signed: 03/01/2018 4:34:09 PM By: Montey Hora Entered By: Montey Hora on 03/01/2018  11:42:37 [name] ([id number]) -------------------------------------------------------------------------------- Vitals Details Patient Name: Heather Dear A. Date of Service: 03/01/2018 11:30 AM Medical Record Number: [id number] Patient Account Number: [id number] Date of Birth/Sex: 04/11/1976 (41 y.o. F) Treating  RN: Montey Hora Primary Care Gladstone Rosas: Suzan Garibaldi Other Clinician: Referring Gohan Collister: Suzan Garibaldi Treating Sherald Balbuena/Extender: Melburn Hake, HOYT Weeks in Treatment: 6 Vital Signs Time Taken: 11:37 Temperature (F): 98.6 Height (in): 69 Pulse (bpm): 85 Weight (lbs): 250 Respiratory Rate (breaths/min): 18 Body Mass Index (BMI): 36.9 Blood Pressure (mmHg): 90/61 Reference Range: 80 - 120 mg / dl Electronic Signature(s) Signed: 03/01/2018 4:34:09 PM By: Montey Hora Entered By: Montey Hora on 03/01/2018 11:37:41

## 2018-03-08 ENCOUNTER — Encounter: Payer: Medicaid Other | Admitting: Nurse Practitioner

## 2018-03-08 DIAGNOSIS — E11621 Type 2 diabetes mellitus with foot ulcer: Secondary | ICD-10-CM | POA: Diagnosis not present

## 2018-03-11 NOTE — Progress Notes (Signed)
PACEY, WILLADSEN ([id number]) Visit Report for 03/08/2018 Chief Complaint Document Details Patient Name: [name], [name] Date of Service: 03/08/2018 1:00 PM Medical Record Number: [id number] Patient Account Number: [id number] Date of Birth/Sex: 21-May-1976 (42 y.o. F) Treating RN: Ahmed Prima Primary Care Provider: Suzan Garibaldi Other Clinician: Referring Provider: Suzan Garibaldi Treating Provider/Extender: Cathie Olden in Treatment: 7 Information Obtained from: Patient Chief Complaint She is here for evaluation of a left great toe ulcer Electronic Signature(s) Signed: 03/08/2018 1:38:53 PM By: Lawanda Cousins Entered By: Lawanda Cousins on 03/08/2018 13:38:52 Olazabal, Bryli A. ([id number]) -------------------------------------------------------------------------------- Debridement Details Patient Name: [name] A. Date of Service: 03/08/2018 1:00 PM Medical Record Number: [id number] Patient Account Number: [id number] Date of Birth/Sex: 30-May-1976 (42 y.o. F) Treating RN: Ahmed Prima Primary Care Provider: Suzan Garibaldi Other Clinician: Referring Provider: Suzan Garibaldi Treating Provider/Extender: Cathie Olden in Treatment: 7 Debridement Performed for Wound #1 Left Toe Great Assessment: Performed By: Physician Lawanda Cousins, NP Debridement Type: Debridement Severity of Tissue Pre Fat layer exposed Debridement: Pre-procedure Verification/Time Yes - 13:28 Out Taken: Start Time: 13:28 Pain Control: Lidocaine 4% Topical Solution Total Area Debrided (L x W): 0.8 (cm) x 0.8 (cm) = 0.64 (cm) Tissue and other material Viable, Non-Viable, Callus, Slough, Subcutaneous, Fibrin/Exudate, Slough debrided: Level: Skin/Subcutaneous Tissue Debridement Description: Excisional Instrument: Curette Bleeding: Minimum Hemostasis Achieved: Pressure End Time: 13:30 Procedural Pain: 0 Post Procedural Pain: 0 Response to Treatment: Procedure was tolerated well Level of  Consciousness: Awake and Alert Post Procedure Vitals: Temperature: 98.5 Pulse: 120 Respiratory Rate: 18 Blood Pressure: Systolic Blood Pressure: 062 Diastolic Blood Pressure: 70 Post Debridement Measurements of Total Wound Length: (cm) 0.8 Width: (cm) 0.8 Depth: (cm) 0.3 Volume: (cm) 0.151 Character of Wound/Ulcer Post Debridement: Requires Further Debridement Severity of Tissue Post Debridement: Fat layer exposed Post Procedure Diagnosis Same as Pre-procedure Notes NP aware of HR Electronic Signature(s) MAUDE, [name] ([id number]) Signed: 03/08/2018 1:38:42 PM By: Lawanda Cousins Signed: 03/09/2018 4:38:26 PM By: Alric Quan Entered By: Lawanda Cousins on 03/08/2018 13:38:42 [address], [name] ([id number]) -------------------------------------------------------------------------------- HPI Details Patient Name: [name] A. Date of Service: 03/08/2018 1:00 PM Medical Record Number: [id number] Patient Account Number: [id number] Date of Birth/Sex: 03-13-1976 (42 y.o. F) Treating RN: Ahmed Prima Primary Care Provider: Suzan Garibaldi Other Clinician: Referring Provider: Suzan Garibaldi Treating Provider/Extender: Cathie Olden in Treatment: 7 History of Present Illness HPI Description: 01/18/18-She is here for initial evaluation of the left great toe ulcer. She is a poor historian in regards to timeframe in detail. She states approximately 4 weeks ago she lacerated her toe on something in the house. She followed up with her primary care who placed her on Bactrim and ultimately a second dose of Bactrim prior to coming to wound clinic. She states she has been treating the toe with peroxide, Betadine and a Band-Aid. She did not check her blood sugar this morning but checked it yesterday morning it was 327; she is unaware of a recent A1c and there are no current records. She saw Dr. she would've orthopedics last week for an old injury to the left ankle, she states he did  not see her toe, nor did she bring it to his attention. She smokes approximately 1 pack cigarettes a day. Her social situation is concerning, she arrives this morning with her mother who appears extremely intoxicated/under the influence; her mother was asked to leave the room and be monitored by the patient's grandmother. The patient's aunt then accompanied the patient and the room  throughout the rest of the appointment. We had a lengthy discussion regarding the deleterious effects of uncontrolled hyperglycemia and smoking as it relates to wound healing and overall health. She was strongly encouraged to decrease her smoking and get her diabetes under better control. She states she is currently on a diet and has cut down her [facility] consumption. The left toe is erythematous, macerated and slightly edematous with malodor present. The edema in her left foot is below her baseline, there is no erythema streaking. We will treat her with Santyl, doxycycline; we have ordered and xray, culture and provided a Peg assist surgical shoe and cultured the wound. 01/25/18-She is here in follow-up evaluation for a left great toe ulcer and presents with an abscess to her suprapubic area. She states her blood sugars remain elevated, feeling "sick" and if levels are below 250, but she is trying. She has made no attempt to decrease her smoking stating that we "can't take away her food in her cigarettes". She has been compliant with offloading using the PEG assist you. She is using Santyl daily. the culture obtained last week grew staph aureus and Enterococcus faecalis; continues on the doxycycline and Augmentin was added on Monday. The suprapubic area has erythema, no femoral variation, purple discoloration, minimal induration, was accessed with a cotton tip applicator with sanguinopurulent drainage, this was cultured, I suspect the current antibiotic treatment will cover and we will not add anything to her current  treatment plan. She was advised to go to urgent care or ER with any change in redness, induration or fever. 02/01/18-She is here in follow-up evaluation for left great toe ulcers and a new abdominal abscess from last week. She was able to use packing until earlier this week, where she "forgot it was there". She states she was feeling ill with GI symptoms last week and was not taking her antibiotic. She states her glucose levels have been predominantly less than 200, with occasional levels between 200-250. She thinks this was contributing to her GI symptoms as they have resolved without intervention. There continues to be significant laceration to left toe, otherwise it clinically looks stable/improved. There is now less superficial opening to the lateral aspect of the great toe that was residual blister. We will transition to [facility] to all wounds, she will continue her Augmentin. If there is no change or deterioration next week for reculture. 02/08/18-She is here in follow-up evaluation for left great toe ulcer and abdominal ulcer. There is an improvement in both wounds. She has been wrapping her left toe with coban, not by our direction, which has created an area of discoloration to the medial aspect; she has been advised to NOT use coban secondary to her neuropathy. She states her glucose levels have been high over this last week ranging from 200-350, she continues to smoke. She admits to being less compliant with her offloading shoe. We will continue with same treatment plan and she will follow-up next week. 02/15/18-She is here in follow-up evaluation for left great toe ulcer and abdominal ulcer. The abdominal ulcer is epithelialized. The left great toe ulcer is improved and all injury from last week using the Coban wrap is resolved, the lateral ulcer is healed. She admits to noncompliance with wearing offloading shoe and admits to glucose levels being greater than 300 most of the week. She  continues to smoke and expresses no desire to quit. There is one area medially that probes deeper than it has historically, erythema to the  toe and dorsal foot has consistently waxed and waned. There is no overt signs of cellulitis or infection but we will culture the wound for any occult infection given the new area of depth and erythema. We will hold off on sensitivities for initiation of antibiotic therapy. 02/22/18-She is here in follow up evaluation for left great toe ulcer. There is overall significant improvement in both wound appearance, erythema and edema with changes made last week. She was not initiated on antibiotic therapy. Culture obtained last week showed oxacillin sensitive staph aureus, sensitive to clindamycin. Clindamycin has been called into the pharmacy but she has been instructed to hold off on initiation secondary to overall clinical improvement and her history of antibiotic intolerance. She has been instructed to contact the clinic with any noted changes/deterioration and the wound, erythema, Sandiford, Julyssa A. ([id number]) edema and/or pain. She will follow-up next week. She continues to smoke and her glucose levels remain elevated >250; she admits to compliance with offloading shoe 03/01/18 on evaluation today patient appears to be doing fairly well in regard to her left first toe ulcer. She has been tolerating the dressing changes with the [facility] Dressing without complication and overall this has definitely showed signs of improvement according to records as well is what the patient tells me today. I'm very pleased in that regard. She is having no pain today 03/08/18 She is here for follow up evaluation of a left great toe ulcer. She remains non-compliant with glucose control and smoking cessation; glucose levels consistently >200. She states that she got new shoe inserts/peg assist. She admits to compliance with offloading. Since my last evaluation there is significant  improvement. We will switch to prisma at this time and she will follow up next week. She is noted to be tachycardic at this appointment, heart rate 120s; she has a history of heart rate 70-130 according to our records. She admits to extreme agitation r/t personal issues; she was advised to monitor her heartrate and contact her physician if it does not return to a more normal range (<100). She takes cardizem twice daily. Electronic Signature(s) Signed: 03/08/2018 3:38:18 PM By: Lawanda Cousins Previous Signature: 03/08/2018 1:42:38 PM Version By: Lawanda Cousins Entered By: Lawanda Cousins on 03/08/2018 15:38:18 [name] ([id number]) -------------------------------------------------------------------------------- Physician Orders Details Patient Name: [name] A. Date of Service: 03/08/2018 1:00 PM Medical Record Number: [id number] Patient Account Number: [id number] Date of Birth/Sex: [birth year] (42 y.o. F) Treating RN: Ahmed Prima Primary Care Provider: Suzan Garibaldi Other Clinician: Referring Provider: Suzan Garibaldi Treating Provider/Extender: Cathie Olden in Treatment: 7 Verbal / Phone Orders: Yes Clinician: Carolyne Fiscal, Debi Read Back and Verified: Yes Diagnosis Coding Wound Cleansing Wound #1 Left Toe Great o Clean wound with Normal Saline. o Cleanse wound with mild soap and water o May Shower, gently pat wound dry prior to applying new dressing. Anesthetic (add to Medication List) Wound #1 Left Toe Great o Topical Lidocaine 4% cream applied to wound bed prior to debridement (In Clinic Only). Primary Wound Dressing Wound #1 Left Toe Great o Silver Collagen Secondary Dressing Wound #1 Left Toe Great o Dry Gauze o Conform/Kerlix Dressing Change Frequency Wound #1 Left Toe Great o Change dressing every other day. o Other: - as needed Follow-up Appointments Wound #1 Left Toe Great o Return Appointment in 1 week. Edema Control Wound #1  Left Toe Great o Elevate legs to the level of the heart and pump ankles as often as possible o Other: - Ace bandage Off-Loading  Wound #1 Left Toe Great o Open toe surgical shoe with peg assist. Additional Orders / Instructions Wound #1 Left Toe Great o Stop Smoking o Increase protein intake. [name], [name] ([id number]) Patient Medications Allergies: erythromycin base Notifications Medication Indication Start End lidocaine DOSE 1 - topical 4 % cream - 1 cream topical Notes monitor HR and if it does not come down call your primary Electronic Signature(s) Signed: 03/08/2018 5:24:28 PM By: Lawanda Cousins Signed: 03/09/2018 4:38:26 PM By: Alric Quan Entered By: Alric Quan on 03/08/2018 13:35:17 [name] ([id number]) -------------------------------------------------------------------------------- Prescription 03/08/2018 Patient Name: [name] A. Provider: Lawanda Cousins NP Date of Birth: [birth year] NPI#: [id number] Sex: F DEA#: [id number] Phone #: [phone] License #: Patient Address: [facility] [place], [place] [address] [address], [place], [place] [address] [id number] Allergies erythromycin base Medication Medication: Route: Strength: Form: lidocaine topical 4% cream Class: TOPICAL LOCAL ANESTHETICS Dose: Frequency / Time: Indication: 1 1 cream topical Number of Refills: Number of Units: 0 Generic Substitution: Start Date: End Date: Administered at Substitution Permitted Facility: Yes Time Administered: Time Discontinued: Note to Pharmacy: Signature(s): Date(s): Electronic Signature(s) Signed: 03/08/2018 5:24:28 PM By: Lawanda Cousins Signed: 03/09/2018 4:38:26 PM By: Alric Quan Entered By: Alric Quan on 03/08/2018 13:35:17 [name] ([id number]) [name], [name]  ([id number]) --------------------------------------------------------------------------------  Problem List Details Patient Name: [name] A. Date of Service: 03/08/2018 1:00 PM Medical Record Number: [id number] Patient Account Number: [id number] Date of Birth/Sex: 1976-07-20 (42 y.o. 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 ([id number]) -------------------------------------------------------------------------------- Progress Note Details Patient Name: [name] A. Date of Service: 03/08/2018 1:00 PM Medical Record Number: [id number] Patient Account Number: [id number] Date of Birth/Sex: Sep 04, 1976 (42 y.o. F) Treating RN: Ahmed Prima Primary Care Provider: Suzan Garibaldi Other Clinician: Referring Provider: Suzan Garibaldi Treating Provider/Extender: Cathie Olden in Treatment: 7 Subjective Chief Complaint Information obtained from Patient She is here for evaluation of a left great toe ulcer History of Present Illness (HPI) 01/18/18-She is here for initial evaluation of the left great toe ulcer. She is a poor historian in regards to timeframe in detail. She states approximately 4 weeks ago she lacerated her toe on something in the house. She followed up with her primary care who placed her on  Bactrim and ultimately a second dose of Bactrim prior to coming to wound clinic. She states she has been treating the toe with peroxide, Betadine and a Band-Aid. She did not check her blood sugar this morning but checked it yesterday morning it was 327; she is unaware of a recent A1c and there are no current records. She saw Dr. she would've orthopedics last week for an old injury to the left ankle, she states he did not see her toe, nor did she bring it to his attention. She smokes approximately 1 pack cigarettes a day. Her social situation is concerning, she arrives this morning with  her mother who appears extremely intoxicated/under the influence; her mother was asked to leave the room and be monitored by the patient's grandmother. The patient's aunt then accompanied the patient and the room throughout the rest of the appointment. We had a lengthy discussion regarding the deleterious effects of uncontrolled hyperglycemia and smoking as it relates to wound healing and overall health. She was strongly encouraged to decrease her smoking and get her diabetes under better control. She states she is currently on a diet and has cut down her [facility] consumption. The left toe is erythematous, macerated and slightly edematous with malodor present. The edema in her left foot is below her baseline, there is no erythema streaking. We will treat her with Santyl, doxycycline; we have ordered and xray, culture and provided a Peg assist surgical shoe and cultured the wound. 01/25/18-She is here in follow-up evaluation for a left great toe ulcer and presents with an abscess to her suprapubic area. She states her blood sugars remain elevated, feeling "sick" and if levels are below 250, but she is trying. She has made no attempt to decrease her smoking stating that we "can't take away her food in her cigarettes". She has been compliant with offloading using the PEG assist you. She is using Santyl daily. the  culture obtained last week grew staph aureus and Enterococcus faecalis; continues on the doxycycline and Augmentin was added on Monday. The suprapubic area has erythema, no femoral variation, purple discoloration, minimal induration, was accessed with a cotton tip applicator with sanguinopurulent drainage, this was cultured, I suspect the current antibiotic treatment will cover and we will not add anything to her current treatment plan. She was advised to go to urgent care or ER with any change in redness, induration or fever. 02/01/18-She is here in follow-up evaluation for left great toe ulcers and a new abdominal abscess from last week. She was able to use packing until earlier this week, where she "forgot it was there". She states she was feeling ill with GI symptoms last week and was not taking her antibiotic. She states her glucose levels have been predominantly less than 200, with occasional levels between 200-250. She thinks this was contributing to her GI symptoms as they have resolved without intervention. There continues to be significant laceration to left toe, otherwise it clinically looks stable/improved. There is now less superficial opening to the lateral aspect of the great toe that was residual blister. We will transition to [facility] to all wounds, she will continue her Augmentin. If there is no change or deterioration next week for reculture. 02/08/18-She is here in follow-up evaluation for left great toe ulcer and abdominal ulcer. There is an improvement in both wounds. She has been wrapping her left toe with coban, not by our direction, which has created an area of discoloration to the medial aspect; she has been advised to NOT use coban secondary to her neuropathy. She states her glucose levels have been high over this last week ranging from 200-350, she continues to smoke. She admits to being less compliant with her offloading shoe. We will continue with same treatment plan  and she will follow-up next week. 02/15/18-She is here in follow-up evaluation for left great toe ulcer and abdominal ulcer. The abdominal ulcer is epithelialized. The left great toe ulcer is improved and all injury from last week using the Coban wrap is resolved, the lateral ulcer is healed. She admits to noncompliance with wearing offloading shoe and admits to glucose levels  being greater than 300 most of the week. She continues to smoke and expresses no desire to quit. There is one area medially that probes deeper than it has historically, erythema to the toe and dorsal foot has consistently waxed and waned. There is no overt signs of cellulitis or infection but we will culture the wound for any occult infection given the new area of depth and erythema. We will hold off on Botts, Loucille A. ([id number]) sensitivities for initiation of antibiotic therapy. 02/22/18-She is here in follow up evaluation for left great toe ulcer. There is overall significant improvement in both wound appearance, erythema and edema with changes made last week. She was not initiated on antibiotic therapy. Culture obtained last week showed oxacillin sensitive staph aureus, sensitive to clindamycin. Clindamycin has been called into the pharmacy but she has been instructed to hold off on initiation secondary to overall clinical improvement and her history of antibiotic intolerance. She has been instructed to contact the clinic with any noted changes/deterioration and the wound, erythema, edema and/or pain. She will follow-up next week. She continues to smoke and her glucose levels remain elevated >250; she admits to compliance with offloading shoe 03/01/18 on evaluation today patient appears to be doing fairly well in regard to her left first toe ulcer. She has been tolerating the dressing changes with the [facility] Dressing without complication and overall this has definitely showed signs of improvement according to  records as well is what the patient tells me today. I'm very pleased in that regard. She is having no pain today 03/08/18 She is here for follow up evaluation of a left great toe ulcer. She remains non-compliant with glucose control and smoking cessation; glucose levels consistently >200. She states that she got new shoe inserts/peg assist. She admits to compliance with offloading. Since my last evaluation there is significant improvement. We will switch to prisma at this time and she will follow up next week. She is noted to be tachycardic at this appointment, heart rate 120s; she has a history of heart rate 70-130 according to our records. She admits to extreme agitation r/t personal issues; she was advised to monitor her heartrate and contact her physician if it does not return to a more normal range (<100). She takes cardizem twice daily. Patient History Information obtained from Patient. Family History Cancer - Paternal Grandparents, Heart Disease - Paternal Grandparents, Hypertension - Paternal Grandparents, No family history of Diabetes, Hereditary Spherocytosis, Kidney Disease, Lung Disease, Seizures, Stroke, Thyroid Problems, Tuberculosis. Social History Current every day smoker, Marital Status - Single, Alcohol Use - Never, Drug Use - Prior History, Caffeine Use - Daily. Objective Constitutional Vitals Time Taken: 1:20 PM, Height: 69 in, Weight: 250 lbs, BMI: 36.9, Temperature: 98.5 F, Pulse: 128 bpm, Respiratory Rate: 18 breaths/min, Blood Pressure: 103/70 mmHg. Integumentary (Hair, Skin) Wound #1 status is Open. Original cause of wound was Gradually Appeared. The wound is located on the Left Toe Great. The wound measures 0.8cm length x 0.8cm width x 0.2cm depth; 0.503cm^2 area and 0.101cm^3 volume. There is Fat Layer (Subcutaneous Tissue) Exposed exposed. There is no tunneling or undermining noted. There is a small amount of serous drainage noted. The wound margin is flat and  intact. There is large (67-100%) red, pink granulation within the wound bed. There is a small (1-33%) amount of necrotic tissue within the wound bed including Adherent Slough. The periwound skin appearance exhibited: Callus. The periwound skin appearance did not exhibit: Crepitus, Excoriation, Induration, Rash, Scarring, Dry/Scaly,  Maceration, Atrophie Blanche, Cyanosis, Ecchymosis, Hemosiderin Staining, Mottled, Pallor, Rubor, Erythema. Periwound temperature was noted as No Abnormality. The periwound has tenderness on palpation. 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 There was a Excisional Skin/Subcutaneous Tissue Debridement with a total area of 0.64 sq cm performed by Lawanda Cousins, NP. With the following instrument(s): Curette to remove Viable and Non-Viable tissue/material. Material removed includes Callus, Subcutaneous Tissue, Slough, and Fibrin/Exudate after achieving pain control using Lidocaine 4% Topical Solution. No specimens were taken. A time out was conducted at 13:28, prior to the start of the procedure. A Minimum amount of bleeding was controlled with Pressure. The procedure was tolerated well with a pain level of 0 throughout and a pain level of 0 following the procedure. Patient s Level of Consciousness post procedure was recorded as Awake and Alert and post-procedure vitals were taken including Temperature: 98.5 F, Pulse: 120 bpm, Respiratory Rate: 18 breaths/min, Blood Pressure: (103)/(70) mmHg. Post Debridement Measurements: 0.8cm length x  0.8cm width x 0.3cm depth; 0.151cm^3 volume. Character of Wound/Ulcer Post Debridement requires further debridement. Severity of Tissue Post Debridement is: Fat layer exposed. Post procedure Diagnosis Wound #1: Same as Pre-Procedure General Notes: NP aware of HR. Plan Wound Cleansing: Wound #1 Left Toe Great: Clean wound with Normal Saline. Cleanse wound with mild soap and water May Shower, gently pat wound dry prior to applying new dressing. Anesthetic (add to Medication List): Wound #1 Left Toe Great: Topical Lidocaine 4% cream applied to wound bed prior to debridement (In Clinic Only). Primary Wound Dressing: Wound #1 Left Toe Great: Silver Collagen Secondary Dressing: Wound #1 Left Toe Great: Dry Gauze Conform/Kerlix Dressing Change Frequency: Wound #1 Left Toe Great: [name], [name] ([id number]) Change dressing every other day. Other: - as needed Follow-up Appointments: Wound #1 Left Toe Great: Return Appointment in 1 week. Edema Control: Wound #1 Left Toe Great: Elevate legs to the level of the heart and pump ankles as often as possible Other: - Ace bandage Off-Loading: Wound #1 Left Toe Great: Open toe surgical shoe with peg assist. Additional Orders / Instructions: Wound #1 Left Toe Great: Stop Smoking Increase protein intake. The following medication(s) was prescribed: lidocaine topical 4 % cream 1 1 cream topical was prescribed at facility General Notes: monitor HR and if it does not come down call your primary Electronic Signature(s) Signed: 03/08/2018 3:38:47 PM By: Lawanda Cousins Previous Signature: 03/08/2018 2:16:14 PM Version By: Lawanda Cousins Entered By: Lawanda Cousins on 03/08/2018 15:38:46 [name] ([id number]) -------------------------------------------------------------------------------- ROS/PFSH Details Patient Name: [name] A. Date of Service: 03/08/2018 1:00 PM Medical Record Number: [id number] Patient Account Number:  [id number] Date of Birth/Sex: 10-Jan-1976 (42 y.o. F) Treating RN: Ahmed Prima Primary Care Provider: Suzan Garibaldi Other Clinician: Referring Provider: Suzan Garibaldi Treating Provider/Extender: Cathie Olden in Treatment: 7 Information Obtained From Patient Wound History Do you currently have one or more open woundso Yes How many open wounds do you currently haveo 1 Approximately how long have you had your woundso 1 month How have you been treating your wound(s) until nowo open to air Has your wound(s) ever  healed and then re-openedo No Have you had any lab work done in the past montho No Have you tested positive for an antibiotic resistant organism (MRSA, VRE)o No Have you tested positive for osteomyelitis (bone infection)o No Have you had any tests for circulation on your legso No Hematologic/Lymphatic Medical History: Negative for: Anemia; Hemophilia; Human Immunodeficiency Virus; Lymphedema; Sickle Cell Disease Respiratory Medical History: Positive for: Chronic Obstructive Pulmonary Disease (COPD) Negative for: Aspiration; Asthma; Pneumothorax; Sleep Apnea; Tuberculosis Cardiovascular Medical History: Positive for: Congestive Heart Failure; Hypertension Negative for: Angina; Arrhythmia; Coronary Artery Disease; Deep Vein Thrombosis; Hypotension; Myocardial Infarction; Peripheral Arterial Disease; Peripheral Venous Disease; Phlebitis; Vasculitis Gastrointestinal Medical History: Negative for: Cirrhosis ; Colitis; Crohnos; Hepatitis A; Hepatitis B; Hepatitis C Endocrine Medical History: Positive for: Type II Diabetes Treated with: Insulin, Oral agents Genitourinary Medical History: Negative for: End Stage Renal Disease Immunological [name], [name] ([id number]) Medical History: Negative for: Lupus Erythematosus; Raynaudos; Scleroderma Musculoskeletal Medical History: Negative for: Gout; Rheumatoid Arthritis; Osteoarthritis; Osteomyelitis Neurologic Medical  History: Positive for: Neuropathy; Seizure Disorder Oncologic Medical History: Negative for: Received Chemotherapy; Received Radiation Immunizations Pneumococcal Vaccine: Received Pneumococcal Vaccination: Yes Immunization Notes: up to date Implantable Devices Family and Social History Cancer: Yes - Paternal Grandparents; Diabetes: No; Heart Disease: Yes - Paternal Grandparents; Hereditary Spherocytosis: No; Hypertension: Yes - Paternal Grandparents; Kidney Disease: No; Lung Disease: No; Seizures: No; Stroke: No; Thyroid Problems: No; Tuberculosis: No; Current every day smoker; Marital Status - Single; Alcohol Use: Never; Drug Use: Prior History; Caffeine Use: Daily; Financial Concerns: No; Food, Clothing or Shelter Needs: No; Support System Lacking: No; Transportation Concerns: No; Advanced Directives: No; Patient does not want information on Advanced Directives Physician Affirmation I have reviewed and agree with the above information. Electronic Signature(s) Signed: 03/08/2018 5:24:28 PM By: Lawanda Cousins Signed: 03/09/2018 4:38:26 PM By: Alric Quan Entered By: Lawanda Cousins on 03/08/2018 14:15:41 [name] ([id number]) -------------------------------------------------------------------------------- SuperBill Details Patient Name: [name] A. Date of Service: 03/08/2018 Medical Record Number: [id number] Patient Account Number: [id number] Date of Birth/Sex: 17-Jun-1976 (42 y.o. 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 L03.032 Cellulitis of left toe L02.211 Cutaneous abscess of abdominal wall Facility Procedures CPT4 Code Description:  [id number] 11042 - DEB SUBQ TISSUE 20 SQ CM/< ICD-10 Diagnosis Description E11.621 Type 2 diabetes mellitus with foot ulcer 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

## 2018-03-11 NOTE — Progress Notes (Signed)
[name], [name] ([id number]) Visit Report for 03/08/2018 Arrival Information Details Patient Name: [name], [name] Date of Service: 03/08/2018 1:00 PM Medical Record Number: [id number] Patient Account Number: [id number] Date of Birth/Sex: 19-Mar-1976 (42 y.o. F) Treating RN: Roger Shelter Primary Care Bertha Earwood: Suzan Garibaldi Other Clinician: Referring Tayvion Lauder: Suzan Garibaldi Treating Ivis Nicolson/Extender: Cathie Olden in Treatment: 7 Visit Information History Since Last Visit All ordered tests and consults were completed: No Patient Arrived: Wheel Chair Added or deleted any medications: No Arrival Time: 13:16 Any new allergies or adverse reactions: No Accompanied By: mom Had a fall or experienced change in No Transfer Assistance: None activities of daily living that may affect Patient Identification Verified: Yes risk of falls: Secondary Verification Process Completed: Yes Signs or symptoms of abuse/neglect since last visito No Patient Has Alerts: Yes Hospitalized since last visit: No Patient Alerts: DMII Implantable device outside of the clinic excluding No cellular tissue based products placed in the center since last visit: Pain Present Now: No Electronic Signature(s) Signed: 03/08/2018 4:26:21 PM By: Roger Shelter Entered By: Roger Shelter on 03/08/2018 13:16:43 [name] ([id number]) -------------------------------------------------------------------------------- Encounter Discharge Information Details Patient Name: Heather Dear A. Date of Service: 03/08/2018 1:00 PM Medical Record Number: [id number] Patient Account Number: [id number] Date of Birth/Sex: Oct 16, 1976 (42 y.o. F) Treating RN: Roger Shelter Primary Care Rhyanna Sorce: Suzan Garibaldi Other Clinician: Referring Inez Rosato: Suzan Garibaldi Treating Mel Langan/Extender: Cathie Olden in Treatment: 7 Encounter Discharge Information Items Discharge Condition: Stable Ambulatory Status:  Wheelchair Discharge Destination: Home Transportation: Private Auto Accompanied By: mom Schedule Follow-up Appointment: Yes Clinical Summary of Care: Notes rechecked heart rate prior to leaving clinic heart rate is 120 bpm. notified [name] NP Electronic Signature(s) Signed: 03/08/2018 4:26:21 PM By: Roger Shelter Entered By: Roger Shelter on 03/08/2018 13:47:02 [name], [name] ([id number]) -------------------------------------------------------------------------------- Lower Extremity Assessment Details Patient Name: Heather Dear A. Date of Service: 03/08/2018 1:00 PM Medical Record Number: [id number] Patient Account Number: [id number] Date of Birth/Sex: 17-Sep-1976 (42 y.o. F) Treating RN: Roger Shelter Primary Care Manish Ruggiero: Suzan Garibaldi Other Clinician: Referring Laurel Smeltz: Suzan Garibaldi Treating Anam Bobby/Extender: Cathie Olden in Treatment: 7 Edema Assessment Assessed: [Left: No] [Right: No] Edema: [Left: N] [Right: o] Vascular Assessment Claudication: Claudication Assessment [Left:None] Pulses: Dorsalis Pedis Palpable: [Left:Yes] Posterior Tibial Extremity colors, hair growth, and conditions: Extremity Color: [Left:Normal] Hair Growth on Extremity: [Left:Yes] Temperature of Extremity: [Left:Warm] Capillary Refill: [Left:< 3 seconds] Toe Nail Assessment Left: Right: Thick: No Discolored: No Deformed: No Improper Length and Hygiene: No Electronic Signature(s) Signed: 03/08/2018 4:26:21 PM By: Roger Shelter Entered By: Roger Shelter on 03/08/2018 13:23:39 [name], [name] ([id number]) -------------------------------------------------------------------------------- Multi Wound Chart Details Patient Name: Heather Dear A. Date of Service: 03/08/2018 1:00 PM Medical Record Number: [id number] Patient Account Number: [id number] Date of Birth/Sex: May 10, 1976 (42 y.o. F) Treating RN: Ahmed Prima Primary Care Mykell Rawl: Suzan Garibaldi Other  Clinician: Referring Amery Minasyan: Suzan Garibaldi Treating Lucas Exline/Extender: Cathie Olden in Treatment: 7 Vital Signs Height(in): 69 Pulse(bpm): 128 Weight(lbs): 250 Blood Pressure(mmHg): 103/70 Body Mass Index(BMI): 37 Temperature(F): 98.5 Respiratory Rate 18 (breaths/min): Photos: [1:No Photos] [N/A:N/A] Wound Location: [1:Left Toe Great] [N/A:N/A] Wounding Event: [1:Gradually Appeared] [N/A:N/A] Primary Etiology: [1:Diabetic Wound/Ulcer of the N/A Lower Extremity] Comorbid History: [1:Chronic Obstructive Pulmonary Disease (COPD), Congestive Heart Failure, Hypertension, Type II Diabetes, Neuropathy, Seizure Disorder] [N/A:N/A] Date Acquired: [1:01/01/2018] [N/A:N/A] Weeks of Treatment: [1:7] [N/A:N/A] Wound Status: [1:Open] [N/A:N/A] Pending Amputation on [1:Yes] [N/A:N/A] Presentation: Measurements L x W x D [1:0.8x0.8x0.2] [N/A:N/A] (cm) Area (cm) : [1:0.503] [N/A:N/A] Volume (cm) : [  1:0.101] [N/A:N/A] % Reduction in Area: [1:93.40%] [N/A:N/A] % Reduction in Volume: [1:96.70%] [N/A:N/A] Classification: [1:Grade 2] [N/A:N/A] Exudate Amount: [1:Small] [N/A:N/A] Exudate Type: [1:Serous] [N/A:N/A] Exudate Color: [1:amber] [N/A:N/A] Wound Margin: [1:Flat and Intact] [N/A:N/A] Granulation Amount: [1:Large (67-100%)] [N/A:N/A] Granulation Quality: [1:Red, Pink] [N/A:N/A] Necrotic Amount: [1:Small (1-33%)] [N/A:N/A] Exposed Structures: [1:Fat Layer (Subcutaneous Tissue) Exposed: Yes Fascia: No Tendon: No Muscle: No Joint: No Bone: No] [N/A:N/A] Epithelialization: None N/A N/A Debridement: Debridement - Excisional N/A N/A Pre-procedure 13:28 N/A N/A Verification/Time Out Taken: Pain Control: Lidocaine 4% Topical Solution N/A N/A Tissue Debrided: Callus, Subcutaneous, Slough N/A N/A Level: Skin/Subcutaneous Tissue N/A N/A Debridement Area (sq cm): 0.64 N/A N/A Instrument: Curette N/A N/A Bleeding: Minimum N/A N/A Hemostasis Achieved: Pressure N/A N/A Procedural  Pain: 0 N/A N/A Post Procedural Pain: 0 N/A N/A Debridement Treatment Procedure was tolerated well N/A N/A Response: Post Debridement 0.8x0.8x0.3 N/A N/A Measurements L x W x D (cm) Post Debridement Volume: 0.151 N/A N/A (cm) Periwound Skin Texture: Callus: Yes N/A N/A Excoriation: No Induration: No Crepitus: No Rash: No Scarring: No Periwound Skin Moisture: Maceration: No N/A N/A Dry/Scaly: No Periwound Skin Color: Atrophie Blanche: No N/A N/A Cyanosis: No Ecchymosis: No Erythema: No Hemosiderin Staining: No Mottled: No Pallor: No Rubor: No Temperature: No Abnormality N/A N/A Tenderness on Palpation: Yes N/A N/A Wound Preparation: Ulcer Cleansing: N/A N/A Rinsed/Irrigated with Saline Topical Anesthetic Applied: Other: lidocaine 4% Procedures Performed: Debridement N/A N/A Treatment Notes Electronic Signature(s) Signed: 03/08/2018 1:38:20 PM By: Lawanda Cousins Entered By: Lawanda Cousins on 03/08/2018 13:38:20 [name] ([id number]) -------------------------------------------------------------------------------- [place] Details Patient Name: Heather Dear A. Date of Service: 03/08/2018 1:00 PM Medical Record Number: [id number] Patient Account Number: [id number] Date of Birth/Sex: Mar 09, 1976 (42 y.o. F) Treating RN: Ahmed Prima Primary Care Shela Esses: Suzan Garibaldi Other Clinician: Referring Aul Mangieri: Suzan Garibaldi Treating Ryne Mctigue/Extender: Cathie Olden in Treatment: 7 Active Inactive ` Abuse / Safety / Falls / Self Care Management Nursing Diagnoses: Potential for falls Goals: Patient will not experience any injury related to falls Date Initiated: 01/18/2018 Target Resolution Date: 04/28/2018 Goal Status: Active Interventions: Assess fall risk on admission and as needed Assess: immobility, friction, shearing, incontinence upon admission and as needed Assess impairment of mobility on admission and as needed per  policy Assess personal safety and home safety (as indicated) on admission and as needed Assess self care needs on admission and as needed Notes: ` Nutrition Nursing Diagnoses: Imbalanced nutrition Impaired glucose control: actual or potential Potential for alteratiion in Nutrition/Potential for imbalanced nutrition Goals: Patient/caregiver agrees to and verbalizes understanding of need to use nutritional supplements and/or vitamins as prescribed Date Initiated: 01/18/2018 Target Resolution Date: 04/28/2018 Goal Status: Active Patient/caregiver will maintain therapeutic glucose control Date Initiated: 01/18/2018 Target Resolution Date: 04/28/2018 Goal Status: Active Interventions: Assess patient nutrition upon admission and as needed per policy Provide education on elevated blood sugars and impact on wound healing Notes: [name], [name] ([id number]) Orientation to the Wound Care Program Nursing Diagnoses: Knowledge deficit related to the wound healing center program Goals: Patient/caregiver will verbalize understanding of the [place] Date Initiated: 01/18/2018 Target Resolution Date: 01/27/2018 Goal Status: Active Interventions: Provide education on orientation to the wound center Notes: ` Wound/Skin Impairment Nursing Diagnoses: Impaired tissue integrity Knowledge deficit related to smoking impact on wound healing Knowledge deficit related to ulceration/compromised skin integrity Goals: Ulcer/skin breakdown will have a volume reduction of 80% by week 12 Date Initiated: 01/18/2018 Target Resolution Date: 04/21/2018 Goal Status: Active  Interventions: Assess patient/caregiver ability to perform ulcer/skin care regimen upon admission and as needed Assess ulceration(s) every visit Notes: Electronic Signature(s) Signed: 03/09/2018 4:38:26 PM By: Alric Quan Entered By: Alric Quan on 03/08/2018 13:27:33 [name], [name]  ([id number]) -------------------------------------------------------------------------------- Pain Assessment Details Patient Name: Heather Dear A. Date of Service: 03/08/2018 1:00 PM Medical Record Number: [id number] Patient Account Number: [id number] Date of Birth/Sex: January 04, 1976 (42 y.o. F) Treating RN: Roger Shelter Primary Care Giovonni Poirier: Suzan Garibaldi Other Clinician: Referring Infiniti Hoefling: Suzan Garibaldi Treating Laster Appling/Extender: Cathie Olden in Treatment: 7 Active Problems Location of Pain Severity and Description of Pain Patient Has Paino No Site Locations Pain Management and Medication Current Pain Management: Electronic Signature(s) Signed: 03/08/2018 4:26:21 PM By: Roger Shelter Entered By: Roger Shelter on 03/08/2018 13:16:49 [name] ([id number]) -------------------------------------------------------------------------------- Patient/Caregiver Education Details Patient Name: Heather Dear A. Date of Service: 03/08/2018 1:00 PM Medical Record Number: [id number] Patient Account Number: [id number] Date of Birth/Gender: Aug 19, 1976 (42 y.o. F) Treating RN: Roger Shelter Primary Care Physician: Suzan Garibaldi Other Clinician: Referring Physician: Suzan Garibaldi Treating Physician/Extender: Cathie Olden in Treatment: 7 Education Assessment Education Provided To: Patient Education Topics Provided Wound Debridement: Handouts: Wound Debridement Methods: Explain/Verbal Responses: State content correctly Wound/Skin Impairment: Handouts: Caring for Your Ulcer Methods: Explain/Verbal Responses: State content correctly Electronic Signature(s) Signed: 03/08/2018 4:26:21 PM By: Roger Shelter Entered By: Roger Shelter on 03/08/2018 13:47:18 [name], [name] ([id number]) -------------------------------------------------------------------------------- Wound Assessment Details Patient Name: Heather Dear A. Date of Service: 03/08/2018 1:00  PM Medical Record Number: [id number] Patient Account Number: [id number] Date of Birth/Sex: Jan 29, 1976 (42 y.o. F) Treating RN: Roger Shelter Primary Care Webb Weed: Suzan Garibaldi Other Clinician: Referring Treson Laura: Suzan Garibaldi Treating Birtha Hatler/Extender: Cathie Olden in Treatment: 7 Wound Status Wound Number: 1 Primary Diabetic Wound/Ulcer of the Lower Extremity Etiology: Wound Location: Left Toe Great Wound Open Wounding Event: Gradually Appeared Status: Date Acquired: 01/01/2018 Comorbid Chronic Obstructive Pulmonary Disease Weeks Of Treatment: 7 History: (COPD), Congestive Heart Failure, Clustered Wound: No Hypertension, Type II Diabetes, Neuropathy, Pending Amputation On Presentation Seizure Disorder Photos Photo Uploaded By: Roger Shelter on 03/08/2018 16:17:15 Wound Measurements Length: (cm) 0.8 Width: (cm) 0.8 Depth: (cm) 0.2 Area: (cm) 0.503 Volume: (cm) 0.101 % Reduction in Area: 93.4% % Reduction in Volume: 96.7% Epithelialization: None Tunneling: No Undermining: No Wound Description Classification: Grade 2 Wound Margin: Flat and Intact Exudate Amount: Small Exudate Type: Serous Exudate Color: amber Foul Odor After Cleansing: No Slough/Fibrino Yes Wound Bed Granulation Amount: Large (67-100%) Exposed Structure Granulation Quality: Red, Pink Fascia Exposed: No Necrotic Amount: Small (1-33%) Fat Layer (Subcutaneous Tissue) Exposed: Yes Necrotic Quality: Adherent Slough Tendon Exposed: No Muscle Exposed: No Joint Exposed: No Bone Exposed: No [name], [name] ([id number]) Periwound Skin Texture Texture Color No Abnormalities Noted: No No Abnormalities Noted: No Callus: Yes Atrophie Blanche: No Crepitus: No Cyanosis: No Excoriation: No Ecchymosis: No Induration: No Erythema: No Rash: No Hemosiderin Staining: No Scarring: No Mottled: No Pallor: No Moisture Rubor: No No Abnormalities Noted: No Dry / Scaly: No Temperature /  Pain Maceration: No Temperature: No Abnormality Tenderness on Palpation: Yes Wound Preparation Ulcer Cleansing: Rinsed/Irrigated with Saline Topical Anesthetic Applied: Other: lidocaine 4%, Treatment Notes Wound #1 (Left Toe Great) 1. Cleansed with: Clean wound with Normal Saline 2. Anesthetic Topical Lidocaine 4% cream to wound bed prior to debridement 4. Dressing Applied: Prisma Ag 5. Secondary Dressing Applied Dry Gauze Notes conform wrap with Ace wrap applied Electronic Signature(s) Signed: 03/08/2018 4:26:21 PM By: Roger Shelter Entered By: Claudina Lick  Cheryl on 03/08/2018 13:22:39 [name], [name] ([id number]) -------------------------------------------------------------------------------- Vitals Details Patient Name: [name], [name] Date of Service: 03/08/2018 1:00 PM Medical Record Number: [id number] Patient Account Number: [id number] Date of Birth/Sex: 1976/08/11 (42 y.o. F) Treating RN: Roger Shelter Primary Care Diany Formosa: Suzan Garibaldi Other Clinician: Referring Undrea Shipes: Suzan Garibaldi Treating Elmor Kost/Extender: Cathie Olden in Treatment: 7 Vital Signs Time Taken: 13:20 Temperature (F): 98.5 Height (in): 69 Pulse (bpm): 128 Weight (lbs): 250 Respiratory Rate (breaths/min): 18 Body Mass Index (BMI): 36.9 Blood Pressure (mmHg): 103/70 Reference Range: 80 - 120 mg / dl Electronic Signature(s) Signed: 03/08/2018 4:26:21 PM By: Roger Shelter Entered By: Roger Shelter on 03/08/2018 13:21:59

## 2018-03-15 ENCOUNTER — Encounter: Payer: Medicaid Other | Admitting: Nurse Practitioner

## 2018-03-15 DIAGNOSIS — E11621 Type 2 diabetes mellitus with foot ulcer: Secondary | ICD-10-CM | POA: Diagnosis not present

## 2018-03-17 NOTE — Progress Notes (Signed)
Heather [name], Heather [name] ([id number]) Visit Report for 03/15/2018 Chief Complaint Document Details Patient Name: Heather [name], Heather [name] Date of Service: 03/15/2018 1:00 PM Medical Record Number: [id number] Patient Account Number: [id number] Date of Birth/Sex: July 14, 1976 (42 y.o. F) Treating RN: Heather [name] Primary Care Provider: Suzan [name] Other Clinician: Referring Provider: Suzan [name] Treating Provider/Extender: Heather [name] in Treatment: 8 Information Obtained from: Patient Chief Complaint She is here for evaluation of [name] left great toe ulcer Electronic Signature(s) Signed: 03/15/2018 1:43:30 PM By: Heather [name] Entered By: Heather [name] on 03/15/2018 13:43:30 Heather [name], Heather [name] ([id number]) -------------------------------------------------------------------------------- Debridement Details Patient Name: Heather [name] Date of Service: 03/15/2018 1:00 PM Medical Record Number: [id number] Patient Account Number: [id number] Date of Birth/Sex: [birth year] (42 y.o. F) Treating RN: Heather [name] Primary Care Provider: Suzan [name] Other Clinician: Referring Provider: Suzan [name] Treating Provider/Extender: Heather [name] in Treatment: 8 Debridement Performed for Wound #1 Left Toe Great Assessment: Performed By: Physician Heather Cousins, NP Debridement Type: Debridement Severity of Tissue Pre Fat layer exposed Debridement: Pre-procedure Verification/Time Yes - 13:38 Out Taken: Start Time: 13:38 Pain Control: Other : lidocaine 4% Total Area Debrided (L x W): 0.8 (cm) x 0.5 (cm) = 0.4 (cm) Tissue and other material Viable, Non-Viable, Callus, Slough, Subcutaneous, Slough debrided: Level: Skin/Subcutaneous Tissue Debridement Description: Excisional Instrument: Curette Bleeding: Minimum Hemostasis Achieved: Pressure End Time: 13:40 Procedural Pain: 0 Post Procedural Pain: 0 Response to Treatment: Procedure was tolerated well Level of Consciousness: Awake and  Alert Post Procedure Vitals: Temperature: 98.0 Pulse: 90 Respiratory Rate: 18 Blood Pressure: Systolic Blood Pressure: 585 Diastolic Blood Pressure: 72 Post Debridement Measurements of Total Wound Length: (cm) 0.8 Width: (cm) 0.5 Depth: (cm) 0.2 Volume: (cm) 0.063 Character of Wound/Ulcer Post Debridement: Stable Severity of Tissue Post Debridement: Fat layer exposed Post Procedure Diagnosis Same as Pre-procedure Electronic Signature(s) Signed: 03/15/2018 1:43:22 PM By: Heather [name] Signed: 03/15/2018 4:33:19 PM By: Heather [name], Heather [name] ([id number]) Entered By: Heather [name] on 03/15/2018 13:43:21 Heather [name], Heather [name] ([id number]) -------------------------------------------------------------------------------- HPI Details Patient Name: Heather [name] Date of Service: 03/15/2018 1:00 PM Medical Record Number: [id number] Patient Account Number: [id number] Date of Birth/Sex: Apr 09, 1976 (42 y.o. F) Treating RN: Heather [name] Primary Care Provider: Suzan [name] Other Clinician: Referring Provider: Suzan [name] Treating Provider/Extender: Heather [name] in Treatment: 8 History of Present Illness HPI Description: 01/18/18-She is here for initial evaluation of the left great toe ulcer. She is [name] poor historian in regards to timeframe in detail. She states approximately 4 weeks ago she lacerated her toe on something in the house. She followed up with her primary care who placed her on Bactrim and ultimately [name] second dose of Bactrim prior to coming to wound clinic. She states she has been treating the toe with peroxide, Betadine and [name] Band-Aid. She did not check her blood sugar this morning but checked it yesterday morning it was 327; she is unaware of [name] recent A1c and there are no current records. She saw Dr. she would've orthopedics last week for an old injury to the left ankle, she states he did not see her toe, nor did she bring it to his attention. She smokes  approximately 1 pack cigarettes [name] day. Her social situation is concerning, she arrives this morning with her mother who appears extremely intoxicated/under the influence; her mother was asked to leave the room and be monitored by the patient's grandmother. The patient's aunt then accompanied the patient and the room throughout the rest of the appointment. We had  [name] lengthy discussion regarding the deleterious effects of uncontrolled hyperglycemia and smoking as it relates to wound healing and overall health. She was strongly encouraged to decrease her smoking and get her diabetes under better control. She states she is currently on [name] diet and has cut down her [facility] consumption. The left toe is erythematous, macerated and slightly edematous with malodor present. The edema in her left foot is below her baseline, there is no erythema streaking. We will treat her with Santyl, doxycycline; we have ordered and xray, culture and provided [name] Peg assist surgical shoe and cultured the wound. 01/25/18-She is here in follow-up evaluation for [name] left great toe ulcer and presents with an abscess to her suprapubic area. She states her blood sugars remain elevated, feeling "sick" and if levels are below 250, but she is trying. She has made no attempt to decrease her smoking stating that we "can't take away her food in her cigarettes". She has been compliant with offloading using the PEG assist you. She is using Santyl daily. the culture obtained last week grew staph aureus and Enterococcus faecalis; continues on the doxycycline and Augmentin was added on Monday. The suprapubic area has erythema, no femoral variation, purple discoloration, minimal induration, was accessed with [name] cotton tip applicator with sanguinopurulent drainage, this was cultured, I suspect the current antibiotic treatment will cover and we will not add anything to her current treatment plan. She was advised to go to urgent care or ER with any  change in redness, induration or fever. 02/01/18-She is here in follow-up evaluation for left great toe ulcers and [name] new abdominal abscess from last week. She was able to use packing until earlier this week, where she "forgot it was there". She states she was feeling [name] with GI symptoms last week and was not taking her antibiotic. She states her glucose levels have been predominantly less than 200, with occasional levels between 200-250. She thinks this was contributing to her GI symptoms as they have resolved without intervention. There continues to be significant laceration to left toe, otherwise it clinically looks stable/improved. There is now less superficial opening to the lateral aspect of the great toe that was residual blister. We will transition to [facility] to all wounds, she will continue her Augmentin. If there is no change or deterioration next week for reculture. 02/08/18-She is here in follow-up evaluation for left great toe ulcer and abdominal ulcer. There is an improvement in both wounds. She has been wrapping her left toe with coban, not by our direction, which has created an area of discoloration to the medial aspect; she has been advised to NOT use coban secondary to her neuropathy. She states her glucose levels have been high over this last week ranging from 200-350, she continues to smoke. She admits to being less compliant with her offloading shoe. We will continue with same treatment plan and she will follow-up next week. 02/15/18-She is here in follow-up evaluation for left great toe ulcer and abdominal ulcer. The abdominal ulcer is epithelialized. The left great toe ulcer is improved and all injury from last week using the Coban wrap is resolved, the lateral ulcer is healed. She admits to noncompliance with wearing offloading shoe and admits to glucose levels being greater than 300 most of the week. She continues to smoke and expresses no desire to quit. There is one  area medially that probes deeper than it has historically, erythema to the toe and dorsal foot has consistently waxed and  waned. There is no overt signs of cellulitis or infection but we will culture the wound for any occult infection given the new area of depth and erythema. We will hold off on sensitivities for initiation of antibiotic therapy. 02/22/18-She is here in follow up evaluation for left great toe ulcer. There is overall significant improvement in both wound appearance, erythema and edema with changes made last week. She was not initiated on antibiotic therapy. Culture obtained last week showed oxacillin sensitive staph aureus, sensitive to clindamycin. Clindamycin has been called into the pharmacy but she has been instructed to hold off on initiation secondary to overall clinical improvement and her history of antibiotic intolerance. She has been instructed to contact the clinic with any noted changes/deterioration and the wound, erythema, Heather [name], Heather [name] ([id number]) edema and/or pain. She will follow-up next week. She continues to smoke and her glucose levels remain elevated >250; she admits to compliance with offloading shoe 03/01/18 on evaluation today patient appears to be doing fairly well in regard to her left first toe ulcer. She has been tolerating the dressing changes with the [facility] Dressing without complication and overall this has definitely showed signs of improvement according to records as well is what the patient tells me today. I'm very pleased in that regard. She is having no pain today 03/08/18 She is here for follow up evaluation of [name] left great toe ulcer. She remains non-compliant with glucose control and smoking cessation; glucose levels consistently >200. She states that she got new shoe inserts/peg assist. She admits to compliance with offloading. Since my last evaluation there is significant improvement. We will switch to prisma at this time and she will  follow up next week. She is noted to be tachycardic at this appointment, heart rate 120s; she has [name] history of heart rate 70-130 according to our records. She admits to extreme agitation r/t personal issues; she was advised to monitor her heartrate and contact her physician if it does not return to [name] more normal range (<100). She takes cardizem twice daily. 03/15/18-She is here in follow-up evaluation for left great toe ulcer. She remains noncompliant with glucose control and smoking cessation. She admits to compliance with wearing offloading shoe. The ulcer is improved/stable and we will continue with the same treatment plan and she will follow-up next week Electronic Signature(s) Signed: 03/15/2018 1:44:12 PM By: Heather [name] Entered By: Heather [name] on 03/15/2018 13:44:12 Heather [name] ([id number]) -------------------------------------------------------------------------------- Physician Orders Details Patient Name: Heather [name] Date of Service: 03/15/2018 1:00 PM Medical Record Number: [id number] Patient Account Number: [id number] Date of Birth/Sex: 1976-02-14 (42 y.o. F) Treating RN: Heather [name] Primary Care Provider: Suzan [name] Other Clinician: Referring Provider: Suzan [name] Treating Provider/Extender: Heather [name] in Treatment: 8 Verbal / Phone Orders: No Diagnosis Coding Wound Cleansing Wound #1 Left Toe Great o Clean wound with Normal Saline. o Cleanse wound with mild soap and water o May Shower, gently pat wound dry prior to applying new dressing. Anesthetic (add to Medication List) Wound #1 Left Toe Great o Topical Lidocaine 4% cream applied to wound bed prior to debridement (In Clinic Only). Primary Wound Dressing Wound #1 Left Toe Great o Silver Collagen Secondary Dressing Wound #1 Left Toe Great o Dry Gauze o Conform/Kerlix Dressing Change Frequency Wound #1 Left Toe Great o Change dressing every other day. o Other: - as  needed Follow-up Appointments Wound #1 Left Toe Great o Return Appointment in 1 week. Edema Control Wound #1 Left Toe Heather [name]  o Elevate legs to the level of the heart and pump ankles as often as possible o Other: - Ace bandage Off-Loading Wound #1 Left Toe Great o Open toe surgical shoe with peg assist. Additional Orders / Instructions Wound #1 Left Toe Great o Stop Smoking o Increase protein intake. Heather [name], Heather [name] ([id number]) Electronic Signature(s) Signed: 03/15/2018 4:33:19 PM By: Heather [name] Signed: 03/16/2018 8:17:02 AM By: Heather [name] Entered By: Heather [name] on 03/15/2018 13:41:58 Heather [name] ([id number]) -------------------------------------------------------------------------------- Problem List Details Patient Name: Heather [name] Date of Service: 03/15/2018 1:00 PM Medical Record Number: [id number] Patient Account Number: [id number] Date of Birth/Sex: August 22, 1976 (43 y.o. 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 ([id number]) -------------------------------------------------------------------------------- Progress Note Details Patient Name:  Heather [name] Date of Service: 03/15/2018 1:00 PM Medical Record Number: [id number] Patient Account Number: [id number] Date of Birth/Sex: 1975-11-08 (42 y.o. F) Treating RN: Heather [name] Primary Care Provider: Suzan [name] Other Clinician: Referring Provider: Suzan [name] Treating Provider/Extender: Heather [name] in Treatment: 8 Subjective Chief Complaint Information obtained from Patient She is here for evaluation of [name] left great toe ulcer History of Present Illness (HPI) 01/18/18-She is here for initial evaluation of the left great toe ulcer. She is [name] poor historian in regards to timeframe in detail. She states approximately 4 weeks ago she lacerated her toe on something in the house. She followed up with her primary care who placed her on Bactrim and ultimately [name] second dose of Bactrim prior to coming to wound clinic. She states she has been treating the toe with peroxide, Betadine and [name] Band-Aid. She did not check her blood sugar this morning but checked it yesterday morning it was 327; she is unaware of [name] recent A1c and there are no current records. She saw Dr. she would've orthopedics last week for an old injury to the left ankle, she states he did not see her toe, nor did she bring it to his attention. She smokes approximately 1 pack cigarettes [name] day. Her social situation is concerning, she arrives this morning with her mother who appears extremely intoxicated/under the influence; her mother was asked to leave the room and be monitored by the patient's grandmother. The patient's aunt then accompanied the patient and the room throughout the rest of the appointment. We had [name] lengthy discussion regarding the deleterious effects of uncontrolled hyperglycemia and smoking as it relates to wound healing and overall health. She was strongly encouraged to decrease her smoking and get her diabetes under better control. She states she is currently on [name] diet and has cut down her  [facility] consumption. The left toe is erythematous, macerated and slightly edematous with malodor present. The edema in her left foot is below her baseline, there is no erythema streaking. We will treat her with Santyl, doxycycline; we have ordered and xray, culture and provided [name] Peg assist  surgical shoe and cultured the wound. 01/25/18-She is here in follow-up evaluation for [name] left great toe ulcer and presents with an abscess to her suprapubic area. She states her blood sugars remain elevated, feeling "sick" and if levels are below 250, but she is trying. She has made no attempt to decrease her smoking stating that we "can't take away her food in her cigarettes". She has been compliant with offloading using the PEG assist you. She is using Santyl daily. the culture obtained last week grew staph aureus and Enterococcus faecalis; continues on the doxycycline and Augmentin was added on Monday. The suprapubic area has erythema, no femoral variation, purple discoloration, minimal induration, was accessed with [name] cotton tip applicator with sanguinopurulent drainage, this was cultured, I suspect the current antibiotic treatment will cover and we will not add anything to her current treatment plan. She was advised to go to urgent care or ER with any change in redness, induration or fever. 02/01/18-She is here in follow-up evaluation for left great toe ulcers and [name] new abdominal abscess from last week. She was able to use packing until earlier this week, where she "forgot it was there". She states she was feeling [name] with GI symptoms last week and was not taking her antibiotic. She states her glucose levels have been predominantly less than 200, with occasional levels between 200-250. She thinks this was contributing to her GI symptoms as they have resolved without intervention. There continues to be significant laceration to left toe, otherwise it clinically looks stable/improved. There is now less  superficial opening to the lateral aspect of the great toe that was residual blister. We will transition to [facility] to all wounds, she will continue her Augmentin. If there is no change or deterioration next week for reculture. 02/08/18-She is here in follow-up evaluation for left great toe ulcer and abdominal ulcer. There is an improvement in both wounds. She has been wrapping her left toe with coban, not by our direction, which has created an area of discoloration to the medial aspect; she has been advised to NOT use coban secondary to her neuropathy. She states her glucose levels have been high over this last week ranging from 200-350, she continues to smoke. She admits to being less compliant with her offloading shoe. We will continue with same treatment plan and she will follow-up next week. 02/15/18-She is here in follow-up evaluation for left great toe ulcer and abdominal ulcer. The abdominal ulcer is epithelialized. The left great toe ulcer is improved and all injury from last week using the Coban wrap is resolved, the lateral ulcer is healed. She admits to noncompliance with wearing offloading shoe and admits to glucose levels being greater than 300 most of the week. She continues to smoke and expresses no desire to quit. There is one area medially that probes deeper than it has historically, erythema to the toe and dorsal foot has consistently waxed and waned. There is no overt signs of cellulitis or infection but we will culture the wound for any occult infection given the new area of depth and erythema. We will hold off on Ritson, Brea [name] ([id number]) sensitivities for initiation of antibiotic therapy. 02/22/18-She is here in follow up evaluation for left great toe ulcer. There is overall significant improvement in both wound appearance, erythema and edema with changes made last week. She was not initiated on antibiotic therapy. Culture obtained last week showed oxacillin sensitive  staph aureus, sensitive to clindamycin. Clindamycin has been called into the  pharmacy but she has been instructed to hold off on initiation secondary to overall clinical improvement and her history of antibiotic intolerance. She has been instructed to contact the clinic with any noted changes/deterioration and the wound, erythema, edema and/or pain. She will follow-up next week. She continues to smoke and her glucose levels remain elevated >250; she admits to compliance with offloading shoe 03/01/18 on evaluation today patient appears to be doing fairly well in regard to her left first toe ulcer. She has been tolerating the dressing changes with the [facility] Dressing without complication and overall this has definitely showed signs of improvement according to records as well is what the patient tells me today. I'm very pleased in that regard. She is having no pain today 03/08/18 She is here for follow up evaluation of [name] left great toe ulcer. She remains non-compliant with glucose control and smoking cessation; glucose levels consistently >200. She states that she got new shoe inserts/peg assist. She admits to compliance with offloading. Since my last evaluation there is significant improvement. We will switch to prisma at this time and she will follow up next week. She is noted to be tachycardic at this appointment, heart rate 120s; she has [name] history of heart rate 70-130 according to our records. She admits to extreme agitation r/t personal issues; she was advised to monitor her heartrate and contact her physician if it does not return to [name] more normal range (<100). She takes cardizem twice daily. 03/15/18-She is here in follow-up evaluation for left great toe ulcer. She remains noncompliant with glucose control and smoking cessation. She admits to compliance with wearing offloading shoe. The ulcer is improved/stable and we will continue with the same treatment plan and she will follow-up next  week Patient History Information obtained from Patient. Family History Cancer - Paternal Grandparents, Heart Disease - Paternal Grandparents, Hypertension - Paternal Grandparents, No family history of Diabetes, Hereditary Spherocytosis, Kidney Disease, Lung Disease, Seizures, Stroke, Thyroid Problems, Tuberculosis. Social History Current every day smoker, Marital Status - Single, Alcohol Use - Never, Drug Use - Prior History, Caffeine Use - Daily. Objective Constitutional Vitals Time Taken: 1:24 PM, Height: 69 in, Weight: 250 lbs, BMI: 36.9, Temperature: 98.0 F, Pulse: 90 bpm, Respiratory Rate: 18 breaths/min, Blood Pressure: 104/72 mmHg. Integumentary (Hair, Skin) Wound #1 status is Open. Original cause of wound was Gradually Appeared. The wound is located on the Left Toe Great. The wound measures 0.8cm length x 0.5cm width x 0.2cm depth; 0.314cm^2 area and 0.063cm^3 volume. There is Fat Layer (Subcutaneous Tissue) Exposed exposed. There is no tunneling or undermining noted. There is [name] small amount of serous drainage noted. The wound margin is flat and intact. There is large (67-100%) red, pink granulation within the wound bed. There is [name] small (1-33%) amount of necrotic tissue within the wound bed including Adherent Slough. The periwound skin appearance exhibited: Callus. The periwound skin appearance did not exhibit: Crepitus, Excoriation, Induration, Rash, Scarring, Dry/Scaly, Maceration, Atrophie Blanche, Cyanosis, Ecchymosis, Hemosiderin Staining, Mottled, Pallor, Rubor, Meuth, Latanja [name] ([id number]) Erythema. Periwound temperature was noted as No Abnormality. The periwound has tenderness on palpation. 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 There was [name] Excisional Skin/Subcutaneous Tissue Debridement with [name] total area of 0.4 sq cm performed by Heather Cousins, NP. With the following instrument(s): Curette to remove Viable and Non-Viable tissue/material. Material removed includes Callus, Subcutaneous Tissue, and Slough after achieving pain control using Other (lidocaine 4%). No specimens were taken. [name] time out was conducted at 13:38, prior to the start of the procedure. [name] Minimum amount of bleeding was controlled with Pressure. The procedure was tolerated well with [name] pain level of 0 throughout and [name] pain level of 0 following the procedure. Patient s Level of Consciousness post procedure was recorded as Awake and Alert and post-procedure vitals were taken including Temperature: 98.0 F, Pulse: 90 bpm, Respiratory Rate: 18 breaths/min, Blood Pressure: (104)/(72) mmHg. Post Debridement Measurements: 0.8cm length x 0.5cm width x 0.2cm depth; 0.063cm^3 volume. Character of Wound/Ulcer Post Debridement is stable. Severity of Tissue Post Debridement is: Fat layer exposed. Post procedure Diagnosis Wound #1: Same as Pre-Procedure Plan Wound Cleansing: Wound #1 Left Toe Great: Clean wound with Normal Saline. Cleanse wound with mild soap and water May Shower, gently pat wound dry prior to applying new dressing. Anesthetic (add to Medication List): Wound #1 Left Toe Great: Topical Lidocaine 4% cream applied to wound bed prior to debridement (In Clinic Only). Primary Wound Dressing: Wound #1 Left Toe Great: Silver Collagen Secondary Dressing: Wound #1 Left Toe Great: Dry Gauze Conform/Kerlix Dressing Change Frequency: Slight, Heather [name] ([id number]) Wound #1 Left Toe Great: Change dressing every other day. Other: - as needed Follow-up Appointments: Wound #1 Left Toe Great: Return Appointment in 1 week. Edema  Control: Wound #1 Left Toe Great: Elevate legs to the level of the heart and pump ankles as often as possible Other: - Ace bandage Off-Loading: Wound #1 Left Toe Great: Open toe surgical shoe with peg assist. Additional Orders / Instructions: Wound #1 Left Toe Great: Stop Smoking Increase protein intake. Electronic Signature(s) Signed: 03/15/2018 1:45:39 PM By: Heather [name] Previous Signature: 03/15/2018 1:44:35 PM Version By: Heather [name] Entered By: Heather [name] on 03/15/2018 13:45:39 Heather [name] ([id number]) -------------------------------------------------------------------------------- ROS/PFSH Details Patient Name: Heather [name] Date of Service: 03/15/2018 1:00 PM Medical Record Number: [id number] Patient Account Number: [id number] Date of Birth/Sex: 1976-07-14 (42 y.o. F) Treating RN: Heather [name] Primary Care Provider: Suzan [name] Other Clinician: Referring Provider: Suzan [name] Treating Provider/Extender: Heather [name] in Treatment: 8 Information Obtained From Patient Wound History Do you currently have one or more open woundso Yes How many open wounds do you currently haveo 1 Approximately how long have you had your woundso 1 month How have you been treating your wound(s) until nowo open to air Has your wound(s) ever healed and then re-openedo No Have you had any lab work done in the past montho No Have you tested positive for an antibiotic resistant organism (MRSA, VRE)o No Have you tested positive for osteomyelitis (bone infection)o No Have you had any tests for circulation on your legso No Hematologic/Lymphatic Medical History: Negative for: Anemia; Hemophilia; Human Immunodeficiency Virus; Lymphedema; Sickle Cell Disease Respiratory Medical History: Positive for: Chronic Obstructive Pulmonary Disease (COPD) Negative for: Aspiration; Asthma; Pneumothorax; Sleep Apnea; Tuberculosis Cardiovascular Medical History: Positive for:  Congestive Heart Failure; Hypertension Negative for: Angina; Arrhythmia; Coronary Artery Disease; Deep Vein Thrombosis; Hypotension; Myocardial Infarction; Peripheral Arterial Disease; Peripheral Venous Disease; Phlebitis; Vasculitis Gastrointestinal Medical History: Negative for: Cirrhosis ; Colitis; Crohnos; Hepatitis [name]; Hepatitis B; Hepatitis C Endocrine Medical History: Positive for: Type II Diabetes Treated with: Insulin, Oral agents  Genitourinary Medical History: Negative for: End Stage Renal Disease Immunological Heather [name], Heather [name] ([id number]) Medical History: Negative for: Lupus Erythematosus; Raynaudos; Scleroderma Musculoskeletal Medical History: Negative for: Gout; Rheumatoid Arthritis; Osteoarthritis; Osteomyelitis Neurologic Medical History: Positive for: Neuropathy; Seizure Disorder Oncologic Medical History: Negative for: Received Chemotherapy; Received Radiation Immunizations Pneumococcal Vaccine: Received Pneumococcal Vaccination: Yes Immunization Notes: up to date Implantable Devices Family and Social History Cancer: Yes - Paternal Grandparents; Diabetes: No; Heart Disease: Yes - Paternal Grandparents; Hereditary Spherocytosis: No; Hypertension: Yes - Paternal Grandparents; Kidney Disease: No; Lung Disease: No; Seizures: No; Stroke: No; Thyroid Problems: No; Tuberculosis: No; Current every day smoker; Marital Status - Single; Alcohol Use: Never; Drug Use: Prior History; Caffeine Use: Daily; Financial Concerns: No; Food, Clothing or [name] Needs: No; Support System Lacking: No; Transportation Concerns: No; Advanced Directives: No; Patient does not want information on Advanced Directives Physician Affirmation I have reviewed and agree with the above information. Electronic Signature(s) Signed: 03/15/2018 4:33:19 PM By: Heather [name] Signed: 03/16/2018 8:17:02 AM By: Heather [name] Entered By: Heather [name] on 03/15/2018 13:44:24 Heather [name]  ([id number]) -------------------------------------------------------------------------------- SuperBill Details Patient Name: Heather [name] Date of Service: 03/15/2018 Medical Record Number: [id number] Patient Account Number: [id number] Date of Birth/Sex: Apr 07, 1976 (42 y.o. 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

## 2018-03-17 NOTE — Progress Notes (Signed)
WESTLYN, GLAZA ([id number]) Visit Report for 03/15/2018 Arrival Information Details Patient Name: [name], POMPLUN A. Date of Service: 03/15/2018 1:00 PM Medical Record Number: [id number] Patient Account Number: [id number] Date of Birth/Sex: 19-Sep-1976 (42 y.o. F) Treating RN: Montey Hora Primary Care Fabiola Mudgett: Suzan Garibaldi Other Clinician: Referring Domnique Vantine: Suzan Garibaldi Treating Keishon Chavarin/Extender: Cathie Olden in Treatment: 8 Visit Information History Since Last Visit Added or deleted any medications: No Patient Arrived: Wheel Chair Any new allergies or adverse reactions: No Arrival Time: 13:23 Had a fall or experienced change in No Accompanied By: mother activities of daily living that may affect Transfer Assistance: None risk of falls: Patient Identification Verified: Yes Signs or symptoms of abuse/neglect since last visito No Secondary Verification Process Completed: Yes Hospitalized since last visit: No Patient Has Alerts: Yes Implantable device outside of the clinic excluding No Patient Alerts: DMII cellular tissue based products placed in the center since last visit: Has Dressing in Place as Prescribed: Yes Pain Present Now: No Electronic Signature(s) Signed: 03/15/2018 5:31:29 PM By: Montey Hora Entered By: Montey Hora on 03/15/2018 13:23:49 [name] ([id number]) -------------------------------------------------------------------------------- Encounter Discharge Information Details Patient Name: Heather Dear A. Date of Service: 03/15/2018 1:00 PM Medical Record Number: [id number] Patient Account Number: [id number] Date of Birth/Sex: 1976/03/20 (42 y.o. F) Treating RN: Cornell Barman Primary Care Lyfe Reihl: Suzan Garibaldi Other Clinician: Referring Abshir Paolini: Suzan Garibaldi Treating Michaell Grider/Extender: Cathie Olden in Treatment: 8 Encounter Discharge Information Items Discharge Condition: Stable Ambulatory Status: Wheelchair Discharge  Destination: Home Transportation: Private Auto Accompanied By: mom Schedule Follow-up Appointment: Yes Clinical Summary of Care: Electronic Signature(s) Signed: 03/15/2018 4:49:16 PM By: Gretta Cool, BSN, RN, CWS, [name] RN, BSN Entered By: Gretta Cool, BSN, RN, CWS, [name] on 03/15/2018 16:49:16 [name] ([id number]) -------------------------------------------------------------------------------- Lower Extremity Assessment Details Patient Name: Heather Dear A. Date of Service: 03/15/2018 1:00 PM Medical Record Number: [id number] Patient Account Number: [id number] Date of Birth/Sex: 22-Dec-1975 (42 y.o. F) Treating RN: Montey Hora Primary Care Chevez Sambrano: Suzan Garibaldi Other Clinician: Referring Ayslin Kundert: Suzan Garibaldi Treating Jhamal Plucinski/Extender: Cathie Olden in Treatment: 8 Vascular Assessment Pulses: Dorsalis Pedis Palpable: [Left:Yes] Posterior Tibial Extremity colors, hair growth, and conditions: Extremity Color: [Left:Normal] Hair Growth on Extremity: [Left:Yes] Temperature of Extremity: [Left:Warm] Capillary Refill: [Left:< 3 seconds] Toe Nail Assessment Left: Right: Thick: Yes Discolored: No Deformed: No Improper Length and Hygiene: No Electronic Signature(s) Signed: 03/15/2018 5:31:29 PM By: Montey Hora Entered By: Montey Hora on 03/15/2018 13:34:38 Eilert, Batsheva A. ([id number]) -------------------------------------------------------------------------------- Multi Wound Chart Details Patient Name: Heather Dear A. Date of Service: 03/15/2018 1:00 PM Medical Record Number: [id number] Patient Account Number: [id number] Date of Birth/Sex: 03/04/1976 (42 y.o. F) Treating RN: Roger Shelter Primary Care Marvelene Stoneberg: Suzan Garibaldi Other Clinician: Referring Levi Klaiber: Suzan Garibaldi Treating Gwin Eagon/Extender: Cathie Olden in Treatment: 8 Vital Signs Height(in): 19 Pulse(bpm): 90 Weight(lbs): 250 Blood Pressure(mmHg): 104/72 Body Mass Index(BMI):  37 Temperature(F): 98.0 Respiratory Rate 18 (breaths/min): Photos: [1:No Photos] [N/A:N/A] Wound Location: [1:Left Toe Great] [N/A:N/A] Wounding Event: [1:Gradually Appeared] [N/A:N/A] Primary Etiology: [1:Diabetic Wound/Ulcer of the N/A Lower Extremity] Comorbid History: [1:Chronic Obstructive Pulmonary Disease (COPD), Congestive Heart Failure, Hypertension, Type II Diabetes, Neuropathy, Seizure Disorder] [N/A:N/A] Date Acquired: [1:01/01/2018] [N/A:N/A] Weeks of Treatment: [1:8] [N/A:N/A] Wound Status: [1:Open] [N/A:N/A] Pending Amputation on [1:Yes] [N/A:N/A] Presentation: Measurements L x W x D [1:0.8x0.5x0.2] [N/A:N/A] (cm) Area (cm) : [1:0.314] [N/A:N/A] Volume (cm) : [1:0.063] [N/A:N/A] % Reduction in Area: [1:95.90%] [N/A:N/A] % Reduction in Volume: [1:97.90%] [N/A:N/A] Classification: [1:Grade 2] [N/A:N/A] Exudate Amount: [1:Small] [N/A:N/A] Exudate Type: [  1:Serous] [N/A:N/A] Exudate Color: [1:amber] [N/A:N/A] Wound Margin: [1:Flat and Intact] [N/A:N/A] Granulation Amount: [1:Large (67-100%)] [N/A:N/A] Granulation Quality: [1:Red, Pink] [N/A:N/A] Necrotic Amount: [1:Small (1-33%)] [N/A:N/A] Exposed Structures: [1:Fat Layer (Subcutaneous Tissue) Exposed: Yes Fascia: No Tendon: No Muscle: No Joint: No Bone: No] [N/A:N/A] Epithelialization: None N/A N/A Debridement: Debridement - Excisional N/A N/A Pre-procedure 13:38 N/A N/A Verification/Time Out Taken: Pain Control: Other N/A N/A Tissue Debrided: Subcutaneous, Slough N/A N/A Level: Skin/Subcutaneous Tissue N/A N/A Debridement Area (sq cm): 0.4 N/A N/A Instrument: Curette N/A N/A Bleeding: Minimum N/A N/A Hemostasis Achieved: Pressure N/A N/A Procedural Pain: 0 N/A N/A Post Procedural Pain: 0 N/A N/A Debridement Treatment Procedure was tolerated well N/A N/A Response: Post Debridement 0.8x0.5x0.2 N/A N/A Measurements L x W x D (cm) Post Debridement Volume: 0.063 N/A N/A (cm) Periwound Skin  Texture: Callus: Yes N/A N/A Excoriation: No Induration: No Crepitus: No Rash: No Scarring: No Periwound Skin Moisture: Maceration: No N/A N/A Dry/Scaly: No Periwound Skin Color: Atrophie Blanche: No N/A N/A Cyanosis: No Ecchymosis: No Erythema: No Hemosiderin Staining: No Mottled: No Pallor: No Rubor: No Temperature: No Abnormality N/A N/A Tenderness on Palpation: Yes N/A N/A Wound Preparation: Ulcer Cleansing: N/A N/A Rinsed/Irrigated with Saline Topical Anesthetic Applied: Other: lidocaine 4% Procedures Performed: Debridement N/A N/A Treatment Notes Electronic Signature(s) Signed: 03/15/2018 1:43:07 PM By: Lawanda Cousins Entered By: Lawanda Cousins on 03/15/2018 13:43:07 [name] ([id number]) -------------------------------------------------------------------------------- [place] Details Patient Name: Heather Dear A. Date of Service: 03/15/2018 1:00 PM Medical Record Number: [id number] Patient Account Number: [id number] Date of Birth/Sex: 07-15-1976 (42 y.o. F) Treating RN: Roger Shelter Primary Care Keryl Gholson: Suzan Garibaldi Other Clinician: Referring Benjimin Hadden: Suzan Garibaldi Treating Madisyn Mawhinney/Extender: Cathie Olden in Treatment: 8 Active Inactive ` Abuse / Safety / Falls / Self Care Management Nursing Diagnoses: Potential for falls Goals: Patient will not experience any injury related to falls Date Initiated: 01/18/2018 Target Resolution Date: 04/28/2018 Goal Status: Active Interventions: Assess fall risk on admission and as needed Assess: immobility, friction, shearing, incontinence upon admission and as needed Assess impairment of mobility on admission and as needed per policy Assess personal safety and home safety (as indicated) on admission and as needed Assess self care needs on admission and as needed Notes: ` Nutrition Nursing Diagnoses: Imbalanced nutrition Impaired glucose control: actual or potential Potential  for alteratiion in Nutrition/Potential for imbalanced nutrition Goals: Patient/caregiver agrees to and verbalizes understanding of need to use nutritional supplements and/or vitamins as prescribed Date Initiated: 01/18/2018 Target Resolution Date: 04/28/2018 Goal Status: Active Patient/caregiver will maintain therapeutic glucose control Date Initiated: 01/18/2018 Target Resolution Date: 04/28/2018 Goal Status: Active Interventions: Assess patient nutrition upon admission and as needed per policy Provide education on elevated blood sugars and impact on wound healing Notes: ANDRA, HESLIN ([id number]) Orientation to the Wound Care Program Nursing Diagnoses: Knowledge deficit related to the wound healing center program Goals: Patient/caregiver will verbalize understanding of the [place] Date Initiated: 01/18/2018 Target Resolution Date: 01/27/2018 Goal Status: Active Interventions: Provide education on orientation to the wound center Notes: ` Wound/Skin Impairment Nursing Diagnoses: Impaired tissue integrity Knowledge deficit related to smoking impact on wound healing Knowledge deficit related to ulceration/compromised skin integrity Goals: Ulcer/skin breakdown will have a volume reduction of 80% by week 12 Date Initiated: 01/18/2018 Target Resolution Date: 04/21/2018 Goal Status: Active Interventions: Assess patient/caregiver ability to perform ulcer/skin care regimen upon admission and as needed Assess ulceration(s) every visit Notes: Electronic Signature(s) Signed: 03/15/2018 4:33:19 PM By: Roger Shelter  Entered By: Roger Shelter on 03/15/2018 13:38:37 Orvis, Hanne AMarland Kitchen ([id number]) -------------------------------------------------------------------------------- Pain Assessment Details Patient Name: LAVONE, BARRIENTES A. Date of Service: 03/15/2018 1:00 PM Medical Record Number: [id number] Patient Account Number: [id number] Date of Birth/Sex: 24-Aug-1976  (42 y.o. F) Treating RN: Montey Hora Primary Care Jaylah Goodlow: Suzan Garibaldi Other Clinician: Referring Gean Laursen: Suzan Garibaldi Treating Sun Kihn/Extender: Cathie Olden in Treatment: 8 Active Problems Location of Pain Severity and Description of Pain Patient Has Paino No Site Locations Pain Management and Medication Current Pain Management: Electronic Signature(s) Signed: 03/15/2018 5:31:29 PM By: Montey Hora Entered By: Montey Hora on 03/15/2018 13:24:13 [name] ([id number]) -------------------------------------------------------------------------------- Patient/Caregiver Education Details Patient Name: Heather Dear A. Date of Service: 03/15/2018 1:00 PM Medical Record Number: [id number] Patient Account Number: [id number] Date of Birth/Gender: 1976/09/12 (42 y.o. F) Treating RN: Cornell Barman Primary Care Physician: Suzan Garibaldi Other Clinician: Referring Physician: Suzan Garibaldi Treating Physician/Extender: Cathie Olden in Treatment: 8 Education Assessment Education Provided To: Patient Education Topics Provided Wound/Skin Impairment: Handouts: Caring for Your Ulcer, Other: wound care as prescribed Methods: Demonstration, Explain/Verbal Responses: State content correctly Electronic Signature(s) Signed: 03/15/2018 5:02:07 PM By: Gretta Cool, BSN, RN, CWS, [name] RN, BSN Entered By: Gretta Cool, BSN, RN, CWS, [name] on 03/15/2018 16:49:38 [name] ([id number]) -------------------------------------------------------------------------------- Wound Assessment Details Patient Name: Heather Dear A. Date of Service: 03/15/2018 1:00 PM Medical Record Number: [id number] Patient Account Number: [id number] Date of Birth/Sex: 1975/11/24 (42 y.o. F) Treating RN: Montey Hora Primary Care Rey Fors: Suzan Garibaldi Other Clinician: Referring Melanee Cordial: Suzan Garibaldi Treating Haillie Radu/Extender: Cathie Olden in Treatment: 8 Wound Status Wound Number: 1 Primary  Diabetic Wound/Ulcer of the Lower Extremity Etiology: Wound Location: Left Toe Great Wound Open Wounding Event: Gradually Appeared Status: Date Acquired: 01/01/2018 Comorbid Chronic Obstructive Pulmonary Disease Weeks Of Treatment: 8 History: (COPD), Congestive Heart Failure, Clustered Wound: No Hypertension, Type II Diabetes, Neuropathy, Pending Amputation On Presentation Seizure Disorder Photos Photo Uploaded By: Montey Hora on 03/15/2018 16:43:29 Wound Measurements Length: (cm) 0.8 Width: (cm) 0.5 Depth: (cm) 0.2 Area: (cm) 0.314 Volume: (cm) 0.063 % Reduction in Area: 95.9% % Reduction in Volume: 97.9% Epithelialization: None Tunneling: No Undermining: No Wound Description Classification: Grade 2 Wound Margin: Flat and Intact Exudate Amount: Small Exudate Type: Serous Exudate Color: amber Foul Odor After Cleansing: No Slough/Fibrino Yes Wound Bed Granulation Amount: Large (67-100%) Exposed Structure Granulation Quality: Red, Pink Fascia Exposed: No Necrotic Amount: Small (1-33%) Fat Layer (Subcutaneous Tissue) Exposed: Yes Necrotic Quality: Adherent Slough Tendon Exposed: No Muscle Exposed: No Joint Exposed: No Bone Exposed: No Millikin, Sorcha A. ([id number]) Periwound Skin Texture Texture Color No Abnormalities Noted: No No Abnormalities Noted: No Callus: Yes Atrophie Blanche: No Crepitus: No Cyanosis: No Excoriation: No Ecchymosis: No Induration: No Erythema: No Rash: No Hemosiderin Staining: No Scarring: No Mottled: No Pallor: No Moisture Rubor: No No Abnormalities Noted: No Dry / Scaly: No Temperature / Pain Maceration: No Temperature: No Abnormality Tenderness on Palpation: Yes Wound Preparation Ulcer Cleansing: Rinsed/Irrigated with Saline Topical Anesthetic Applied: Other: lidocaine 4%, Treatment Notes Wound #1 (Left Toe Great) 1. Cleansed with: Clean wound with Normal Saline 2. Anesthetic Topical Lidocaine 4% cream to  wound bed prior to debridement 4. Dressing Applied: Prisma Ag Notes conform wrap with Ace wrap applied Electronic Signature(s) Signed: 03/15/2018 5:31:29 PM By: Montey Hora Entered By: Montey Hora on 03/15/2018 13:34:09 [name] ([id number]) -------------------------------------------------------------------------------- [place] Details Patient Name: Heather Dear A. Date of Service: 03/15/2018 1:00 PM Medical Record Number: [id number] Patient Account Number: [id number] Date  of Birth/Sex: April 23, 1976 (42 y.o. F) Treating RN: Montey Hora Primary Care Jaley Yan: Suzan Garibaldi Other Clinician: Referring Mckaylah Bettendorf: Suzan Garibaldi Treating Carol Theys/Extender: Cathie Olden in Treatment: 8 Vital Signs Time Taken: 13:24 Temperature (F): 98.0 Height (in): 69 Pulse (bpm): 90 Weight (lbs): 250 Respiratory Rate (breaths/min): 18 Body Mass Index (BMI): 36.9 Blood Pressure (mmHg): 104/72 Reference Range: 80 - 120 mg / dl Electronic Signature(s) Signed: 03/15/2018 5:31:29 PM By: Montey Hora Entered By: Montey Hora on 03/15/2018 13:29:51

## 2018-03-21 NOTE — Progress Notes (Signed)
[name], [name] ([id number]) Visit Report for 02/22/2018 Arrival Information Details Patient Name: [name], [name] Date of Service: 02/22/2018 11:30 AM Medical Record Number: [id number] Patient Account Number: [id number] Date of Birth/Sex: Mar 31, 1976 (42 y.o. F) Treating RN: Ahmed Prima Primary Care Seeley Southgate: Suzan Garibaldi Other Clinician: Referring Sarina Robleto: Suzan Garibaldi Treating Leler Brion/Extender: Cathie Olden in Treatment: 5 Visit Information History Since Last Visit All ordered tests and consults were completed: No Patient Arrived: Wheel Chair Added or deleted any medications: No Arrival Time: 11:42 Any new allergies or adverse reactions: No Accompanied By: step father Had a fall or experienced change in No Transfer Assistance: None activities of daily living that may affect Patient Identification Verified: Yes risk of falls: Secondary Verification Process Completed: Yes Signs or symptoms of abuse/neglect since last visito No Patient Has Alerts: Yes Hospitalized since last visit: No Patient Alerts: DMII Implantable device outside of the clinic excluding No cellular tissue based products placed in the center since last visit: Has Dressing in Place as Prescribed: Yes Pain Present Now: No Electronic Signature(s) Signed: 03/21/2018 7:45:54 AM By: Harold Barban Entered By: Harold Barban on 02/22/2018 11:43:11 [name] ([id number]) -------------------------------------------------------------------------------- Encounter Discharge Information Details Patient Name: Heather Dear A. Date of Service: 02/22/2018 11:30 AM Medical Record Number: [id number] Patient Account Number: [id number] Date of Birth/Sex: January 31, 1976 (42 y.o. F) Treating RN: Ahmed Prima Primary Care Grenda Lora: Suzan Garibaldi Other Clinician: Referring Laydon Martis: Suzan Garibaldi Treating Jakia Kennebrew/Extender: Cathie Olden in Treatment: 5 Encounter Discharge Information Items Discharge  Pain Level: 0 Discharge Condition: Stable Ambulatory Status: Ambulatory Discharge Destination: Home Transportation: Private Auto Accompanied By: step father Schedule Follow-up Appointment: Yes Medication Reconciliation completed and No provided to Patient/Care Amunique Neyra: Provided on Clinical Summary of Care: 02/22/2018 Form Type Recipient Paper Patient [place] Electronic Signature(s) Signed: 03/21/2018 7:45:54 AM By: Harold Barban Entered By: Harold Barban on 02/22/2018 12:20:06 [name] ([id number]) -------------------------------------------------------------------------------- Lower Extremity Assessment Details Patient Name: Heather Dear A. Date of Service: 02/22/2018 11:30 AM Medical Record Number: [id number] Patient Account Number: [id number] Date of Birth/Sex: [birth year] (42 y.o. F) Treating RN: Ahmed Prima Primary Care Sriram Febles: Suzan Garibaldi Other Clinician: Referring Chuck Caban: Suzan Garibaldi Treating Malene Blaydes/Extender: Cathie Olden in Treatment: 5 Vascular Assessment Pulses: Dorsalis Pedis Palpable: [Left:Yes] Posterior Tibial Palpable: [Left:Yes] Extremity colors, hair growth, and conditions: Extremity Color: [Left:Normal] Temperature of Extremity: [Left:Warm] Capillary Refill: [Left:< 3 seconds] Electronic Signature(s) Signed: 02/23/2018 4:40:49 PM By: Alric Quan Signed: 03/21/2018 7:45:54 AM By: Harold Barban Entered By: Harold Barban on 02/22/2018 11:53:48 [name], [name] ([id number]) -------------------------------------------------------------------------------- Multi Wound Chart Details Patient Name: Heather Dear A. Date of Service: 02/22/2018 11:30 AM Medical Record Number: [id number] Patient Account Number: [id number] Date of Birth/Sex: October 24, 1976 (42 y.o. F) Treating RN: Ahmed Prima Primary Care Derelle Cockrell: Suzan Garibaldi Other Clinician: Referring Mckinnon Glick: Suzan Garibaldi Treating Dacey Milberger/Extender: Cathie Olden in  Treatment: 5 Vital Signs Height(in): 69 Pulse(bpm): 90 Weight(lbs): 250 Blood Pressure(mmHg): 113/78 Body Mass Index(BMI): 37 Temperature(F): 98.2 Respiratory Rate 18 (breaths/min): Photos: [1:No Photos] [N/A:N/A] Wound Location: [1:Left Toe Great] [N/A:N/A] Wounding Event: [1:Gradually Appeared] [N/A:N/A] Primary Etiology: [1:Diabetic Wound/Ulcer of the N/A Lower Extremity] Comorbid History: [1:Chronic Obstructive Pulmonary Disease (COPD), Congestive Heart Failure, Hypertension, Type II Diabetes, Neuropathy, Seizure Disorder] [N/A:N/A] Date Acquired: [1:01/01/2018] [N/A:N/A] Weeks of Treatment: [1:5] [N/A:N/A] Wound Status: [1:Open] [N/A:N/A] Pending Amputation on [1:Yes] [N/A:N/A] Presentation: Measurements L x W x D [1:1.2x1x0.3] [N/A:N/A] (cm) Area (cm) : [1:0.942] [N/A:N/A] Volume (cm) : [1:0.283] [N/A:N/A] % Reduction in Area: [1:87.60%] [N/A:N/A] % Reduction in Volume: [  1:90.70%] [N/A:N/A] Classification: [1:Grade 2] [N/A:N/A] Exudate Amount: [1:Small] [N/A:N/A] Exudate Type: [1:Serous] [N/A:N/A] Exudate Color: [1:amber] [N/A:N/A] Wound Margin: [1:Flat and Intact] [N/A:N/A] Granulation Amount: [1:Large (67-100%)] [N/A:N/A] Granulation Quality: [1:Red, Pink] [N/A:N/A] Necrotic Amount: [1:Small (1-33%)] [N/A:N/A] Exposed Structures: [1:Fat Layer (Subcutaneous Tissue) Exposed: Yes Fascia: No Tendon: No Muscle: No Joint: No Bone: No] [N/A:N/A] Epithelialization: None N/A N/A Debridement: Debridement - Excisional N/A N/A Pre-procedure 12:02 N/A N/A Verification/Time Out Taken: Pain Control: Lidocaine 4% Topical Solution N/A N/A Tissue Debrided: Subcutaneous, Slough N/A N/A Level: Skin/Subcutaneous Tissue N/A N/A Debridement Area (sq cm): 1.2 N/A N/A Instrument: Curette N/A N/A Bleeding: Minimum N/A N/A Hemostasis Achieved: Pressure N/A N/A Procedural Pain: 0 N/A N/A Post Procedural Pain: 0 N/A N/A Debridement Treatment Procedure was tolerated well N/A  N/A Response: Post Debridement 1.2x1x0.4 N/A N/A Measurements L x W x D (cm) Post Debridement Volume: 0.377 N/A N/A (cm) Periwound Skin Texture: Callus: Yes N/A N/A Excoriation: No Induration: No Crepitus: No Rash: No Scarring: No Periwound Skin Moisture: Maceration: Yes N/A N/A Dry/Scaly: No Periwound Skin Color: Erythema: Yes N/A N/A Atrophie Blanche: No Cyanosis: No Ecchymosis: No Hemosiderin Staining: No Mottled: No Pallor: No Rubor: No Temperature: No Abnormality N/A N/A Tenderness on Palpation: No N/A N/A Wound Preparation: Ulcer Cleansing: N/A N/A Rinsed/Irrigated with Saline Topical Anesthetic Applied: Other: lidocaine 4% Procedures Performed: Debridement N/A N/A Treatment Notes Electronic Signature(s) Signed: 02/22/2018 12:17:45 PM By: Lawanda Cousins Entered By: Lawanda Cousins on 02/22/2018 12:17:45 [name] ([id number]) -------------------------------------------------------------------------------- [place] Details Patient Name: Heather Dear A. Date of Service: 02/22/2018 11:30 AM Medical Record Number: [id number] Patient Account Number: [id number] Date of Birth/Sex: 28-Jan-1976 (42 y.o. F) Treating RN: Ahmed Prima Primary Care Demyan Fugate: Suzan Garibaldi Other Clinician: Referring Annebelle Bostic: Suzan Garibaldi Treating Manon Banbury/Extender: Cathie Olden in Treatment: 5 Active Inactive ` Abuse / Safety / Falls / Self Care Management Nursing Diagnoses: Potential for falls Goals: Patient will not experience any injury related to falls Date Initiated: 01/18/2018 Target Resolution Date: 04/28/2018 Goal Status: Active Interventions: Assess fall risk on admission and as needed Assess: immobility, friction, shearing, incontinence upon admission and as needed Assess impairment of mobility on admission and as needed per policy Assess personal safety and home safety (as indicated) on admission and as needed Assess self care needs on  admission and as needed Notes: ` Nutrition Nursing Diagnoses: Imbalanced nutrition Impaired glucose control: actual or potential Potential for alteratiion in Nutrition/Potential for imbalanced nutrition Goals: Patient/caregiver agrees to and verbalizes understanding of need to use nutritional supplements and/or vitamins as prescribed Date Initiated: 01/18/2018 Target Resolution Date: 04/28/2018 Goal Status: Active Patient/caregiver will maintain therapeutic glucose control Date Initiated: 01/18/2018 Target Resolution Date: 04/28/2018 Goal Status: Active Interventions: Assess patient nutrition upon admission and as needed per policy Provide education on elevated blood sugars and impact on wound healing Notes: [name], [name] ([id number]) Orientation to the Wound Care Program Nursing Diagnoses: Knowledge deficit related to the wound healing center program Goals: Patient/caregiver will verbalize understanding of the [place] Date Initiated: 01/18/2018 Target Resolution Date: 01/27/2018 Goal Status: Active Interventions: Provide education on orientation to the wound center Notes: ` Wound/Skin Impairment Nursing Diagnoses: Impaired tissue integrity Knowledge deficit related to smoking impact on wound healing Knowledge deficit related to ulceration/compromised skin integrity Goals: Ulcer/skin breakdown will have a volume reduction of 80% by week 12 Date Initiated: 01/18/2018 Target Resolution Date: 04/21/2018 Goal Status: Active Interventions: Assess patient/caregiver ability to perform ulcer/skin care regimen upon admission and as  needed Assess ulceration(s) every visit Notes: Electronic Signature(s) Signed: 02/23/2018 4:40:49 PM By: Alric Quan Entered By: Alric Quan on 02/22/2018 12:02:17 [name] ([id number]) -------------------------------------------------------------------------------- Pain Assessment Details Patient Name: Heather Dear A. Date of Service: 02/22/2018 11:30 AM Medical Record Number: [id number] Patient Account Number: [id number] Date of Birth/Sex: [birth year] (42 y.o. F) Treating RN: Ahmed Prima Primary Care Mry Lamia: Suzan Garibaldi Other Clinician: Referring Frenchie Pribyl: Suzan Garibaldi Treating Hodge Stachnik/Extender: Cathie Olden in Treatment: 5 Active Problems Location of Pain Severity and Description of Pain Patient Has Paino No Site Locations Rate the pain. Current Pain Level: 0 Worst Pain Level: 0 Least Pain Level: 0 Pain Management and Medication Current Pain Management: Electronic Signature(s) Signed: 02/23/2018 4:40:49 PM By: Alric Quan Signed: 03/21/2018 7:45:54 AM By: Harold Barban Entered By: Harold Barban on 02/22/2018 11:43:33 [name] ([id number]) -------------------------------------------------------------------------------- Patient/Caregiver Education Details Patient Name: Heather Dear A. Date of Service: 02/22/2018 11:30 AM Medical Record Number: [id number] Patient Account Number: [id number] Date of Birth/Gender: [birth year] (42 y.o. F) Treating RN: Ahmed Prima Primary Care Physician: Suzan Garibaldi Other Clinician: Referring Physician: Suzan Garibaldi Treating Physician/Extender: Cathie Olden in Treatment: 5 Education Assessment Education Provided To: Patient Education Topics Provided Elevated Blood Sugar/ Impact on Healing: Handouts: Elevated Blood Sugars: How Do They Affect Wound Healing Methods: Explain/Verbal Responses: Reinforcements needed Wound/Skin Impairment: Handouts: Caring for Your Ulcer, Other: continue dressings as prescribed Methods: Explain/Verbal Responses: State content correctly Electronic Signature(s) Signed: 03/21/2018 7:45:54 AM By: Harold Barban Entered By: Harold Barban on 02/22/2018 12:21:04 [name] ([id number]) -------------------------------------------------------------------------------- Wound  Assessment Details Patient Name: Heather Dear A. Date of Service: 02/22/2018 11:30 AM Medical Record Number: [id number] Patient Account Number: [id number] Date of Birth/Sex: 1976/08/27 (42 y.o. F) Treating RN: Ahmed Prima Primary Care Yakir Wenke: Suzan Garibaldi Other Clinician: Referring Dejon Lukas: Suzan Garibaldi Treating Shonika Kolasinski/Extender: Cathie Olden in Treatment: 5 Wound Status Wound Number: 1 Primary Diabetic Wound/Ulcer of the Lower Extremity Etiology: Wound Location: Left Toe Great Wound Open Wounding Event: Gradually Appeared Status: Date Acquired: 01/01/2018 Comorbid Chronic Obstructive Pulmonary Disease Weeks Of Treatment: 5 History: (COPD), Congestive Heart Failure, Clustered Wound: No Hypertension, Type II Diabetes, Neuropathy, Pending Amputation On Presentation Seizure Disorder Photos Photo Uploaded By: Gretta Cool, BSN, RN, CWS, [name] on 02/22/2018 16:09:46 Wound Measurements Length: (cm) 1.2 Width: (cm) 1 Depth: (cm) 0.3 Area: (cm) 0.942 Volume: (cm) 0.283 % Reduction in Area: 87.6% % Reduction in Volume: 90.7% Epithelialization: None Tunneling: No Undermining: No Wound Description Classification: Grade 2 Foul Odor Wound Margin: Flat and Intact Slough/Fi Exudate Amount: Small Exudate Type: Serous Exudate Color: amber After Cleansing: No brino No Wound Bed Granulation Amount: Large (67-100%) Exposed Structure Granulation Quality: Red, Pink Fascia Exposed: No Necrotic Amount: Small (1-33%) Fat Layer (Subcutaneous Tissue) Exposed: Yes Necrotic Quality: Adherent Slough Tendon Exposed: No Muscle Exposed: No Joint Exposed: No Bone Exposed: No [name], [name] ([id number]) Periwound Skin Texture Texture Color No Abnormalities Noted: No No Abnormalities Noted: No Callus: Yes Atrophie Blanche: No Crepitus: No Cyanosis: No Excoriation: No Ecchymosis: No Induration: No Erythema: Yes Rash: No Hemosiderin Staining: No Scarring: No Mottled:  No Pallor: No Moisture Rubor: No No Abnormalities Noted: No Dry / Scaly: No Temperature / Pain Maceration: Yes Temperature: No Abnormality Wound Preparation Ulcer Cleansing: Rinsed/Irrigated with Saline Topical Anesthetic Applied: Other: lidocaine 4%, Electronic Signature(s) Signed: 02/23/2018 4:40:49 PM By: Alric Quan Signed: 03/21/2018 7:45:54 AM By: Harold Barban Entered By: Harold Barban on 02/22/2018 11:52:25 [name] ([id number]) -------------------------------------------------------------------------------- Vitals Details Patient Name: [name],  [name] Date of Service: 02/22/2018 11:30 AM Medical Record Number: [id number] Patient Account Number: [id number] Date of Birth/Sex: [birth year] (42 y.o. F) Treating RN: Ahmed Prima Primary Care Jacy Howat: Suzan Garibaldi Other Clinician: Referring Marcelina Mclaurin: Suzan Garibaldi Treating Jiya Kissinger/Extender: Cathie Olden in Treatment: 5 Vital Signs Time Taken: 11:43 Temperature (F): 98.2 Height (in): 69 Pulse (bpm): 90 Weight (lbs): 250 Respiratory Rate (breaths/min): 18 Body Mass Index (BMI): 36.9 Blood Pressure (mmHg): 113/78 Reference Range: 80 - 120 mg / dl Electronic Signature(s) Signed: 03/21/2018 7:45:54 AM By: Harold Barban Entered By: Harold Barban on 02/22/2018 11:44:32

## 2018-03-22 ENCOUNTER — Encounter: Payer: Medicaid Other | Admitting: Nurse Practitioner

## 2018-03-22 DIAGNOSIS — E11621 Type 2 diabetes mellitus with foot ulcer: Secondary | ICD-10-CM | POA: Diagnosis not present

## 2018-03-25 NOTE — Progress Notes (Signed)
JANNELLY, BERGREN ([id number]) Visit Report for 03/22/2018 Chief Complaint Document Details Patient Name: [name], [name] Date of Service: 03/22/2018 2:15 PM Medical Record Number: [id number] Patient Account Number: [id number] Date of Birth/Sex: 1976-08-20 (42 y.o. F) Treating RN: Ahmed Prima Primary Care Provider: Suzan Garibaldi Other Clinician: Referring Provider: Suzan Garibaldi Treating Provider/Extender: Cathie Olden in Treatment: 9 Information Obtained from: Patient Chief Complaint She is here for evaluation of [name] left great toe ulcer Electronic Signature(s) Signed: 03/22/2018 3:22:41 PM By: Lawanda Cousins Entered By: Lawanda Cousins on 03/22/2018 15:22:40 [name] ([id number]) -------------------------------------------------------------------------------- Debridement Details Patient Name: [name] Date of Service: 03/22/2018 2:15 PM Medical Record Number: [id number] Patient Account Number: [id number] Date of Birth/Sex: 1976/10/12 (42 y.o. F) Treating RN: Ahmed Prima Primary Care Provider: Suzan Garibaldi Other Clinician: Referring Provider: Suzan Garibaldi Treating Provider/Extender: Cathie Olden in Treatment: 9 Debridement Performed for Wound #1 Left Toe Great Assessment: Performed By: Physician Lawanda Cousins, NP Debridement Type: Debridement Severity of Tissue Pre Fat layer exposed Debridement: Pre-procedure Verification/Time Yes - 14:38 Out Taken: Start Time: 14:38 Pain Control: Lidocaine 4% Topical Solution Total Area Debrided (L x W): 0.6 (cm) x 0.5 (cm) = 0.3 (cm) Tissue and other material Viable, Non-Viable, Callus, Slough, Subcutaneous, Fibrin/Exudate, Slough debrided: Level: Skin/Subcutaneous Tissue Debridement Description: Excisional Instrument: Curette Bleeding: Minimum Hemostasis Achieved: Pressure End Time: 14:40 Procedural Pain: 0 Post Procedural Pain: 0 Response to Treatment: Procedure was tolerated well Level of  Consciousness: Awake and Alert Post Procedure Vitals: Temperature: 99.3 Pulse: 84 Respiratory Rate: 16 Blood Pressure: Systolic Blood Pressure: 716 Diastolic Blood Pressure: 75 Post Debridement Measurements of Total Wound Length: (cm) 0.6 Width: (cm) 0.5 Depth: (cm) 0.3 Volume: (cm) 0.071 Character of Wound/Ulcer Post Debridement: Requires Further Debridement Severity of Tissue Post Debridement: Fat layer exposed Post Procedure Diagnosis Same as Pre-procedure Electronic Signature(s) Signed: 03/22/2018 3:22:31 PM By: Lawanda Cousins Signed: 03/23/2018 5:38:20 PM By: Belia Heman, Bertie [name]. ([id number]) Entered By: Lawanda Cousins on 03/22/2018 15:22:31 [name] ([id number]) -------------------------------------------------------------------------------- HPI Details Patient Name: [name] Date of Service: 03/22/2018 2:15 PM Medical Record Number: [id number] Patient Account Number: [id number] Date of Birth/Sex: Oct 25, 1975 (42 y.o. F) Treating RN: Ahmed Prima Primary Care Provider: Suzan Garibaldi Other Clinician: Referring Provider: Suzan Garibaldi Treating Provider/Extender: Cathie Olden in Treatment: 9 History of Present Illness HPI Description: 01/18/18-She is here for initial evaluation of the left great toe ulcer. She is [name] poor historian in regards to timeframe in detail. She states approximately 4 weeks ago she lacerated her toe on something in the house. She followed up with her primary care who placed her on Bactrim and ultimately [name] second dose of Bactrim prior to coming to wound clinic. She states she has been treating the toe with peroxide, Betadine and [name] Band-Aid. She did not check her blood sugar this morning but checked it yesterday morning it was 327; she is unaware of [name] recent A1c and there are no current records. She saw Dr. she would've orthopedics last week for an old injury to the left ankle, she states he did not see her toe, nor did  she bring it to his attention. She smokes approximately 1 pack cigarettes [name] day. Her social situation is concerning, she arrives this morning with her mother who appears extremely intoxicated/under the influence; her mother was asked to leave the room and be monitored by the patient's grandmother. The patient's aunt then accompanied the patient and the room throughout the rest of the  appointment. We had [name] lengthy discussion regarding the deleterious effects of uncontrolled hyperglycemia and smoking as it relates to wound healing and overall health. She was strongly encouraged to decrease her smoking and get her diabetes under better control. She states she is currently on [name] diet and has cut down her [facility] consumption. The left toe is erythematous, macerated and slightly edematous with malodor present. The edema in her left foot is below her baseline, there is no erythema streaking. We will treat her with Santyl, doxycycline; we have ordered and xray, culture and provided [name] Peg assist surgical shoe and cultured the wound. 01/25/18-She is here in follow-up evaluation for [name] left great toe ulcer and presents with an abscess to her suprapubic area. She states her blood sugars remain elevated, feeling "sick" and if levels are below 250, but she is trying. She has made no attempt to decrease her smoking stating that we "can't take away her food in her cigarettes". She has been compliant with offloading using the PEG assist you. She is using Santyl daily. the culture obtained last week grew staph aureus and Enterococcus faecalis; continues on the doxycycline and Augmentin was added on Monday. The suprapubic area has erythema, no femoral variation, purple discoloration, minimal induration, was accessed with [name] cotton tip applicator with sanguinopurulent drainage, this was cultured, I suspect the current antibiotic treatment will cover and we will not add anything to her current treatment plan. She was  advised to go to urgent care or ER with any change in redness, induration or fever. 02/01/18-She is here in follow-up evaluation for left great toe ulcers and [name] new abdominal abscess from last week. She was able to use packing until earlier this week, where she "forgot it was there". She states she was feeling ill with GI symptoms last week and was not taking her antibiotic. She states her glucose levels have been predominantly less than 200, with occasional levels between 200-250. She thinks this was contributing to her GI symptoms as they have resolved without intervention. There continues to be significant laceration to left toe, otherwise it clinically looks stable/improved. There is now less superficial opening to the lateral aspect of the great toe that was residual blister. We will transition to [facility] to all wounds, she will continue her Augmentin. If there is no change or deterioration next week for reculture. 02/08/18-She is here in follow-up evaluation for left great toe ulcer and abdominal ulcer. There is an improvement in both wounds. She has been wrapping her left toe with coban, not by our direction, which has created an area of discoloration to the medial aspect; she has been advised to NOT use coban secondary to her neuropathy. She states her glucose levels have been high over this last week ranging from 200-350, she continues to smoke. She admits to being less compliant with her offloading shoe. We will continue with same treatment plan and she will follow-up next week. 02/15/18-She is here in follow-up evaluation for left great toe ulcer and abdominal ulcer. The abdominal ulcer is epithelialized. The left great toe ulcer is improved and all injury from last week using the Coban wrap is resolved, the lateral ulcer is healed. She admits to noncompliance with wearing offloading shoe and admits to glucose levels being greater than 300 most of the week. She continues to smoke and  expresses no desire to quit. There is one area medially that probes deeper than it has historically, erythema to the toe and dorsal foot has  consistently waxed and waned. There is no overt signs of cellulitis or infection but we will culture the wound for any occult infection given the new area of depth and erythema. We will hold off on sensitivities for initiation of antibiotic therapy. 02/22/18-She is here in follow up evaluation for left great toe ulcer. There is overall significant improvement in both wound appearance, erythema and edema with changes made last week. She was not initiated on antibiotic therapy. Culture obtained last week showed oxacillin sensitive staph aureus, sensitive to clindamycin. Clindamycin has been called into the pharmacy but she has been instructed to hold off on initiation secondary to overall clinical improvement and her history of antibiotic intolerance. She has been instructed to contact the clinic with any noted changes/deterioration and the wound, erythema, Crumble, Kinga [name]. ([id number]) edema and/or pain. She will follow-up next week. She continues to smoke and her glucose levels remain elevated >250; she admits to compliance with offloading shoe 03/01/18 on evaluation today patient appears to be doing fairly well in regard to her left first toe ulcer. She has been tolerating the dressing changes with the [facility] Dressing without complication and overall this has definitely showed signs of improvement according to records as well is what the patient tells me today. I'm very pleased in that regard. She is having no pain today 03/08/18 She is here for follow up evaluation of [name] left great toe ulcer. She remains non-compliant with glucose control and smoking cessation; glucose levels consistently >200. She states that she got new shoe inserts/peg assist. She admits to compliance with offloading. Since my last evaluation there is significant improvement. We will  switch to prisma at this time and she will follow up next week. She is noted to be tachycardic at this appointment, heart rate 120s; she has [name] history of heart rate 70-130 according to our records. She admits to extreme agitation r/t personal issues; she was advised to monitor her heartrate and contact her physician if it does not return to [name] more normal range (<100). She takes cardizem twice daily. 03/15/18-She is here in follow-up evaluation for left great toe ulcer. She remains noncompliant with glucose control and smoking cessation. She admits to compliance with wearing offloading shoe. The ulcer is improved/stable and we will continue with the same treatment plan and she will follow-up next week 03/22/18-She is here for evaluation for left great toe ulcer. There continues to be significant improvement despite recurrent hyperglycemia (over 500 yesterday) and she continues to smoke. She has been compliant with offloading and we will continue with same treatment plan and she will follow-up next week. Electronic Signature(s) Signed: 03/22/2018 3:24:22 PM By: Lawanda Cousins Entered By: Lawanda Cousins on 03/22/2018 15:24:22 [name] ([id number]) -------------------------------------------------------------------------------- Physician Orders Details Patient Name: [name] Date of Service: 03/22/2018 2:15 PM Medical Record Number: [id number] Patient Account Number: [id number] Date of Birth/Sex: [birth year] (42 y.o. F) Treating RN: Ahmed Prima Primary Care Provider: Suzan Garibaldi Other Clinician: Referring Provider: Suzan Garibaldi Treating Provider/Extender: Cathie Olden in Treatment: 9 Verbal / Phone Orders: Yes Clinician: Carolyne Fiscal, Debi Read Back and Verified: Yes Diagnosis Coding Wound Cleansing Wound #1 Left Toe Great o Clean wound with Normal Saline. o Cleanse wound with mild soap and water o May Shower, gently pat wound dry prior to applying new  dressing. Anesthetic (add to Medication List) Wound #1 Left Toe Great o Topical Lidocaine 4% cream applied to wound bed prior to debridement (In Clinic Only). Primary Wound Dressing Wound #  1 Left Toe Great o Silver Collagen Secondary Dressing Wound #1 Left Toe Great o Dry Gauze o Conform/Kerlix Dressing Change Frequency Wound #1 Left Toe Great o Change dressing every other day. o Other: - as needed Follow-up Appointments Wound #1 Left Toe Great o Return Appointment in 1 week. Edema Control Wound #1 Left Toe Great o Elevate legs to the level of the heart and pump ankles as often as possible o Other: - Ace bandage Off-Loading Wound #1 Left Toe Great o Open toe surgical shoe with peg assist. Additional Orders / Instructions Wound #1 Left Toe Great o Stop Smoking o Increase protein intake. ISRA, LINDY ([id number]) Patient Medications Allergies: erythromycin base Notifications Medication Indication Start End lidocaine DOSE 1 - topical 4 % cream - 1 cream topical Electronic Signature(s) Signed: 03/23/2018 6:54:27 AM By: Lawanda Cousins Signed: 03/23/2018 5:38:20 PM By: Alric Quan Entered By: Alric Quan on 03/22/2018 14:42:27 [name] ([id number]) -------------------------------------------------------------------------------- Prescription 03/22/2018 Patient Name: [name] Provider: Lawanda Cousins NP Date of Birth: 1976-04-28 NPI#: [id number] Sex: F DEA#: [id number] Phone #: [phone] License #: Patient Address: [place], [place] [address] [address] [place], [place] [address] [phone] Allergies erythromycin base Medication Medication: Route: Strength: Form: lidocaine topical 4% cream Class: TOPICAL LOCAL ANESTHETICS Dose: Frequency / Time: Indication: 1 1 cream topical Number of Refills: Number of  Units: 0 Generic Substitution: Start Date: End Date: Administered at Substitution Permitted Facility: Yes Time Administered: Time Discontinued: Note to Pharmacy: Signature(s): Date(s): Electronic Signature(s) Signed: 03/23/2018 6:54:27 AM By: Lawanda Cousins Signed: 03/23/2018 5:38:20 PM By: Alric Quan Entered By: Alric Quan on 03/22/2018 14:42:27 [name] ([id number]) [name], Sarabelle [name]. ([id number]) --------------------------------------------------------------------------------  Problem List Details Patient Name: [name] Date of Service: 03/22/2018 2:15 PM Medical Record Number: [id number] Patient Account Number: [id number] Date of Birth/Sex: 1976/08/22 (42 y.o. 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 ([id number]) -------------------------------------------------------------------------------- Progress Note Details Patient Name: [name] Date of Service: 03/22/2018 2:15 PM Medical Record Number: [id number] Patient Account Number: [id number] Date of Birth/Sex: 22-Nov-1975 (42 y.o. F) Treating RN: Ahmed Prima Primary Care Provider: Suzan Garibaldi Other Clinician: Referring Provider: Suzan Garibaldi Treating Provider/Extender: Cathie Olden in Treatment: 9 Subjective Chief Complaint Information obtained from Patient She is here for evaluation of [name] left great toe ulcer History of Present Illness (HPI) 01/18/18-She is here for initial evaluation of the left great toe ulcer. She is [name] poor historian in regards to timeframe in detail. She states approximately 4 weeks ago she lacerated her toe on something in the house. She followed up with her primary care who placed her on Bactrim and ultimately [name] second dose of Bactrim prior to coming to wound clinic. She states she has been treating the toe with peroxide, Betadine and [name] Band-Aid. She did not  check her blood sugar this morning but checked it yesterday morning it was 327; she is unaware of [name] recent A1c and there are no current records. She saw Dr. she would've orthopedics last week for an old injury to the left ankle, she states he did not see her toe, nor did she bring it to his attention. She smokes approximately 1 pack cigarettes [name] day. Her social situation is concerning, she arrives this morning with her mother who appears extremely intoxicated/under the influence; her mother was asked to leave the room and be monitored by the patient's grandmother. The patient's aunt then accompanied the patient and the room throughout the rest of the appointment. We had [name] lengthy discussion regarding the deleterious effects of uncontrolled hyperglycemia and smoking as it relates to wound healing and overall health. She was strongly encouraged to decrease her smoking and get her diabetes under better control. She states she is currently on [name] diet and has cut down her [facility] consumption. The left toe is erythematous, macerated and slightly edematous with malodor present. The edema in her left foot is below her baseline, there is no erythema streaking. We will treat her with Santyl, doxycycline; we have ordered and xray, culture and  provided [name] Peg assist surgical shoe and cultured the wound. 01/25/18-She is here in follow-up evaluation for [name] left great toe ulcer and presents with an abscess to her suprapubic area. She states her blood sugars remain elevated, feeling "sick" and if levels are below 250, but she is trying. She has made no attempt to decrease her smoking stating that we "can't take away her food in her cigarettes". She has been compliant with offloading using the PEG assist you. She is using Santyl daily. the culture obtained last week grew staph aureus and Enterococcus faecalis; continues on the doxycycline and Augmentin was added on Monday. The suprapubic area has erythema, no femoral variation, purple discoloration, minimal induration, was accessed with [name] cotton tip applicator with sanguinopurulent drainage, this was cultured, I suspect the current antibiotic treatment will cover and we will not add anything to her current treatment plan. She was advised to go to urgent care or ER with any change in redness, induration or fever. 02/01/18-She is here in follow-up evaluation for left great toe ulcers and [name] new abdominal abscess from last week. She was able to use packing until earlier this week, where she "forgot it was there". She states she was feeling ill with GI symptoms last week and was not taking her antibiotic. She states her glucose levels have been predominantly less than 200, with occasional levels between 200-250. She thinks this was contributing to her GI symptoms as they have resolved without intervention. There continues to be significant laceration to left toe, otherwise it clinically looks stable/improved. There is now less superficial opening to the lateral aspect of the great toe that was residual blister. We will transition to [facility] to all wounds, she will continue her Augmentin. If there is no change or deterioration next week for reculture. 02/08/18-She is here in follow-up evaluation  for left great toe ulcer and abdominal ulcer. There is an improvement in both wounds. She has been wrapping her left toe with coban, not by our direction, which has created an area of discoloration to the medial aspect; she has been advised to NOT use coban secondary to her neuropathy. She states her glucose levels have been high over this last week ranging from 200-350, she continues to smoke. She admits  to being less compliant with her offloading shoe. We will continue with same treatment plan and she will follow-up next week. 02/15/18-She is here in follow-up evaluation for left great toe ulcer and abdominal ulcer. The abdominal ulcer is epithelialized. The left great toe ulcer is improved and all injury from last week using the Coban wrap is resolved, the lateral ulcer is healed. She admits to noncompliance with wearing offloading shoe and admits to glucose levels being greater than 300 most of the week. She continues to smoke and expresses no desire to quit. There is one area medially that probes deeper than it has historically, erythema to the toe and dorsal foot has consistently waxed and waned. There is no overt signs of cellulitis or infection but we will culture the wound for any occult infection given the new area of depth and erythema. We will hold off on Teagle, Meredeth [name]. ([id number]) sensitivities for initiation of antibiotic therapy. 02/22/18-She is here in follow up evaluation for left great toe ulcer. There is overall significant improvement in both wound appearance, erythema and edema with changes made last week. She was not initiated on antibiotic therapy. Culture obtained last week showed oxacillin sensitive staph aureus, sensitive to clindamycin. Clindamycin has been called into the pharmacy but she has been instructed to hold off on initiation secondary to overall clinical improvement and her history of antibiotic intolerance. She has been instructed to contact the clinic with any  noted changes/deterioration and the wound, erythema, edema and/or pain. She will follow-up next week. She continues to smoke and her glucose levels remain elevated >250; she admits to compliance with offloading shoe 03/01/18 on evaluation today patient appears to be doing fairly well in regard to her left first toe ulcer. She has been tolerating the dressing changes with the [facility] Dressing without complication and overall this has definitely showed signs of improvement according to records as well is what the patient tells me today. I'm very pleased in that regard. She is having no pain today 03/08/18 She is here for follow up evaluation of [name] left great toe ulcer. She remains non-compliant with glucose control and smoking cessation; glucose levels consistently >200. She states that she got new shoe inserts/peg assist. She admits to compliance with offloading. Since my last evaluation there is significant improvement. We will switch to prisma at this time and she will follow up next week. She is noted to be tachycardic at this appointment, heart rate 120s; she has [name] history of heart rate 70-130 according to our records. She admits to extreme agitation r/t personal issues; she was advised to monitor her heartrate and contact her physician if it does not return to [name] more normal range (<100). She takes cardizem twice daily. 03/15/18-She is here in follow-up evaluation for left great toe ulcer. She remains noncompliant with glucose control and smoking cessation. She admits to compliance with wearing offloading shoe. The ulcer is improved/stable and we will continue with the same treatment plan and she will follow-up next week 03/22/18-She is here for evaluation for left great toe ulcer. There continues to be significant improvement despite recurrent hyperglycemia (over 500 yesterday) and she continues to smoke. She has been compliant with offloading and we will continue with same treatment plan and  she will follow-up next week. Patient History Information obtained from Patient. Family History Cancer - Paternal Grandparents, Heart Disease - Paternal Grandparents, Hypertension - Paternal Grandparents, No family history of Diabetes, Hereditary Spherocytosis, Kidney Disease, Lung Disease, Seizures, Stroke, Thyroid  Problems, Tuberculosis. Social History Current every day smoker, Marital Status - Single, Alcohol Use - Never, Drug Use - Prior History, Caffeine Use - Daily. Objective Constitutional Vitals Time Taken: 2:15 PM, Height: 69 in, Weight: 250 lbs, BMI: 36.9, Temperature: 99.1 F, Pulse: 84 bpm, Respiratory Rate: 16 breaths/min, Blood Pressure: 114/75 mmHg. Integumentary (Hair, Skin) Wound #1 status is Open. Original cause of wound was Gradually Appeared. The wound is located on the Left Toe Great. The wound measures 0.6cm length x 0.5cm width x 0.2cm depth; 0.236cm^2 area and 0.047cm^3 volume. There is no tunneling or undermining noted. There is [name] small amount of serous drainage noted. The wound margin is flat and intact. There Harshfield, Zamira [name]. ([id number]) is large (67-100%) red, pink granulation within the wound bed. There is [name] small (1-33%) amount of necrotic tissue within the wound bed including Adherent Slough. The periwound skin appearance exhibited: Callus, Maceration, Erythema. The periwound skin appearance did not exhibit: Crepitus, Excoriation, Induration, Rash, Scarring, Dry/Scaly, Atrophie Blanche, Cyanosis, Ecchymosis, Hemosiderin Staining, Mottled, Pallor, Rubor. The surrounding wound skin color is noted with erythema which is circumferential. Periwound temperature was noted as No Abnormality. The periwound has tenderness on palpation. 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 There was [name] Excisional Skin/Subcutaneous Tissue Debridement with [name] total area of 0.3 sq cm performed by Lawanda Cousins, NP. With the following instrument(s): Curette to remove Viable and Non-Viable tissue/material. Material removed includes Callus, Subcutaneous Tissue, Slough, and Fibrin/Exudate after achieving pain control using Lidocaine 4% Topical Solution. No specimens were taken. [name] time out was conducted at 14:38, prior to the start of the procedure. [name] Minimum amount of bleeding was controlled with Pressure. The procedure was tolerated well with [name] pain level of 0 throughout and [name] pain level of 0 following the procedure. Patient s Level of Consciousness post procedure was recorded as Awake and Alert. Post Debridement Measurements: 0.6cm length x 0.5cm width x 0.3cm depth; 0.071cm^3 volume. Character of Wound/Ulcer Post Debridement requires further debridement. Severity of Tissue Post Debridement is: Fat layer exposed. Post procedure Diagnosis Wound #1: Same as Pre-Procedure Plan Wound Cleansing: Wound #1 Left Toe Great: Clean wound with Normal Saline. Cleanse wound with mild soap and water May Shower, gently pat wound dry prior to applying new dressing. Anesthetic (add to Medication List): Wound #1 Left Toe Great: Topical Lidocaine 4% cream applied to wound bed prior to debridement (In Clinic Only). Primary Wound Dressing: Wound #1 Left Toe Great: Silver Collagen Ecklund, Juanisha [name]. ([id number]) Secondary Dressing: Wound #1 Left Toe Great: Dry Gauze Conform/Kerlix Dressing Change Frequency: Wound #1 Left Toe Great: Change dressing every other day. Other: - as needed Follow-up Appointments: Wound #1 Left Toe Great: Return Appointment in 1 week. Edema Control: Wound #1 Left Toe Great: Elevate legs to the  level of the heart and pump ankles as often as possible Other: - Ace bandage Off-Loading: Wound #1 Left Toe Great: Open toe surgical shoe with peg assist. Additional Orders / Instructions: Wound #1 Left Toe Great: Stop Smoking Increase protein intake. The following medication(s) was prescribed: lidocaine topical 4 % cream 1 1 cream topical was prescribed at facility Electronic Signature(s) Signed: 03/22/2018  3:24:45 PM By: Lawanda Cousins Entered By: Lawanda Cousins on 03/22/2018 15:24:44 [name] ([id number]) -------------------------------------------------------------------------------- ROS/PFSH Details Patient Name: [name] Date of Service: 03/22/2018 2:15 PM Medical Record Number: [id number] Patient Account Number: [id number] Date of Birth/Sex: 10/19/1976 (42 y.o. F) Treating RN: Ahmed Prima Primary Care Provider: Suzan Garibaldi Other Clinician: Referring Provider: Suzan Garibaldi Treating Provider/Extender: Cathie Olden in Treatment: 9 Information Obtained From Patient Wound History Do you currently have one or more open woundso Yes How many open wounds do you currently haveo 1 Approximately how long have you had your woundso 1 month How have you been treating your wound(s) until nowo open to air Has your wound(s) ever healed and then re-openedo No Have you had any lab work done in the past montho No Have you tested positive for an antibiotic resistant organism (MRSA, VRE)o No Have you tested positive for osteomyelitis (bone infection)o No Have you had any tests for circulation on your legso No Hematologic/Lymphatic Medical History: Negative for: Anemia; Hemophilia; Human Immunodeficiency Virus; Lymphedema; Sickle Cell Disease Respiratory Medical History: Positive for: Chronic Obstructive Pulmonary Disease (COPD) Negative for: Aspiration; Asthma; Pneumothorax; Sleep Apnea; Tuberculosis Cardiovascular Medical History: Positive for: Congestive Heart  Failure; Hypertension Negative for: Angina; Arrhythmia; Coronary Artery Disease; Deep Vein Thrombosis; Hypotension; Myocardial Infarction; Peripheral Arterial Disease; Peripheral Venous Disease; Phlebitis; Vasculitis Gastrointestinal Medical History: Negative for: Cirrhosis ; Colitis; Crohnos; Hepatitis [name]; Hepatitis B; Hepatitis C Endocrine Medical History: Positive for: Type II Diabetes Treated with: Insulin, Oral agents Genitourinary Medical History: Negative for: End Stage Renal Disease Immunological POSIE, LILLIBRIDGE [name]. ([id number]) Medical History: Negative for: Lupus Erythematosus; Raynaudos; Scleroderma Musculoskeletal Medical History: Negative for: Gout; Rheumatoid Arthritis; Osteoarthritis; Osteomyelitis Neurologic Medical History: Positive for: Neuropathy; Seizure Disorder Oncologic Medical History: Negative for: Received Chemotherapy; Received Radiation Immunizations Pneumococcal Vaccine: Received Pneumococcal Vaccination: Yes Immunization Notes: up to date Implantable Devices Family and Social History Cancer: Yes - Paternal Grandparents; Diabetes: No; Heart Disease: Yes - Paternal Grandparents; Hereditary Spherocytosis: No; Hypertension: Yes - Paternal Grandparents; Kidney Disease: No; Lung Disease: No; Seizures: No; Stroke: No; Thyroid Problems: No; Tuberculosis: No; Current every day smoker; Marital Status - Single; Alcohol Use: Never; Drug Use: Prior History; Caffeine Use: Daily; Financial Concerns: No; Food, Clothing or Shelter Needs: No; Support System Lacking: No; Transportation Concerns: No; Advanced Directives: No; Patient does not want information on Advanced Directives Physician Affirmation I have reviewed and agree with the above information. Electronic Signature(s) Signed: 03/23/2018 6:54:27 AM By: Lawanda Cousins Signed: 03/23/2018 5:38:20 PM By: Alric Quan Entered By: Lawanda Cousins on 03/22/2018 15:24:31 [name]  ([id number]) -------------------------------------------------------------------------------- [place] Details Patient Name: [name] Date of Service: 03/22/2018 Medical Record Number: [id number] Patient Account Number: [id number] Date of Birth/Sex: 03/21/1976 (42 y.o. 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 L03.032 Cellulitis of left toe Facility Procedures CPT4 Code Description: [id number] 11042 - DEB SUBQ TISSUE 20 SQ CM/< ICD-10 Diagnosis Description 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 with unsp Modifier: ecified severity Quantity: 1 Physician Procedures CPT4 Code Description: [id number] 51884 - WC PHYS SUBQ TISS 20 SQ CM ICD-10 Diagnosis Description 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 with unsp Modifier: ecified severity Quantity: 1 Electronic Signature(s) Signed:  03/22/2018 3:24:55 PM By: Lawanda Cousins Entered By: Lawanda Cousins on 03/22/2018 15:24:54

## 2018-03-27 NOTE — Progress Notes (Signed)
KELCE, BOUTON ([id number]) Visit Report for 03/22/2018 Arrival Information Details Patient Name: [name], [name] Date of Service: 03/22/2018 2:15 PM Medical Record Number: [id number] Patient Account Number: [id number] Date of Birth/Sex: 1975-10-30 (42 y.o. F) Treating RN: [name] Primary Care [name]: [name] Other Clinician: Referring [name]: [name] Treating [name]/Extender: [name] in Treatment: 9 Visit Information History Since Last Visit All ordered tests and consults were completed: No Patient Arrived: Wheel Chair Added or deleted any medications: No Arrival Time: 14:17 Any new allergies or adverse reactions: No Transfer Assistance: None Had a fall or experienced change in No Patient Has Alerts: Yes activities of daily living that may affect Patient Alerts: DMII risk of falls: Signs or symptoms of abuse/neglect since last visito No Hospitalized since last visit: No Has Dressing in Place as Prescribed: Yes Has Compression in Place as Prescribed: No Pain Present Now: No Electronic Signature(s) Signed: 03/27/2018 11:57:38 AM By: [name] Entered By: [name] on 03/22/2018 14:18:24 [name] ([id number]) -------------------------------------------------------------------------------- Encounter Discharge Information Details Patient Name: Heather Dear A. Date of Service: 03/22/2018 2:15 PM Medical Record Number: [id number] Patient Account Number: [id number] Date of Birth/Sex: 04-[name]-1977 (42 y.o. F) Treating RN: [name] Primary Care Jeren Dufrane: [name] Other Clinician: Referring [name]: [name] Treating [name]/Extender: [name] in Treatment: 9 Encounter Discharge Information Items Discharge Condition: Stable Ambulatory Status: Wheelchair Discharge Destination: Home Transportation: Private Auto Schedule Follow-up Appointment: Yes Clinical Summary of Care: Electronic Signature(s) Signed:  03/22/2018 4:00:46 PM By: [name] Entered By: [name] on 03/22/2018 15:02:03 [name] ([id number]) -------------------------------------------------------------------------------- Lower Extremity Assessment Details Patient Name: Heather Dear A. Date of Service: 03/22/2018 2:15 PM Medical Record Number: [id number] Patient Account Number: [id number] Date of Birth/Sex: 10/22/1976 (42 y.o. F) Treating RN: [name] Primary Care Marciel Offenberger: [name] Other Clinician: Referring [name]: [name] Treating [name]/Extender: [name] in Treatment: 9 Edema Assessment Assessed: [Left: No] [Right: No] Edema: [Left: Ye] [Right: s] Calf Left: Right: Point of Measurement: 31 cm From Medial Instep 33.5 cm cm Ankle Left: Right: Point of Measurement: 12 cm From Medial Instep 19.5 cm cm Vascular Assessment Claudication: Claudication Assessment [Left:None] Pulses: Posterior Tibial Palpable: [Left:Yes] Extremity colors, hair growth, and conditions: Extremity Color: [Left:Normal] Hair Growth on Extremity: [Left:Yes] Temperature of Extremity: [Left:Warm] Toe Nail Assessment Left: Right: Thick: No Discolored: No Deformed: No Improper Length and Hygiene: No Electronic Signature(s) Signed: 03/27/2018 11:57:38 AM By: [name] Entered By: [name] on 03/22/2018 14:27:24 [name] ([id number]) -------------------------------------------------------------------------------- Multi Wound Chart Details Patient Name: Heather Dear A. Date of Service: 03/22/2018 2:15 PM Medical Record Number: [id number] Patient Account Number: [id number] Date of Birth/Sex: 1976/04/07 (42 y.o. F) Treating RN: [name] Primary Care [name]: [name] Other Clinician: Referring [name]: [name] Treating [name]/Extender: [name] in Treatment: 9 Vital Signs Height(in): 40 Pulse(bpm): 78 Weight(lbs): 250 Blood Pressure(mmHg):  114/75 Body Mass Index(BMI): 37 Temperature(F): 99.1 Respiratory Rate 16 (breaths/min): Photos: [N/A:N/A] Wound Location: Left Toe Great N/A N/A Wounding Event: Gradually Appeared N/A N/A Primary Etiology: Diabetic Wound/Ulcer of the N/A N/A Lower Extremity Comorbid History: Chronic Obstructive N/A N/A Pulmonary Disease (COPD), Congestive Heart Failure, Hypertension, Type II Diabetes, Neuropathy, Seizure Disorder Date Acquired: 01/01/2018 N/A N/A Weeks of Treatment: 9 N/A N/A Wound Status: Open N/A N/A Pending Amputation on Yes N/A N/A Presentation: Measurements L x W x D 0.6x0.5x0.2 N/A N/A (cm) Area (cm) : 0.236 N/A N/A Volume (cm) : 0.047 N/A N/A % Reduction in  Area: 96.90% N/A N/A % Reduction in Volume: 98.50% N/A N/A Classification: Grade 2 N/A N/A Exudate Amount: Small N/A N/A Exudate Type: Serous N/A N/A Exudate Color: amber N/A N/A Wound Margin: Flat and Intact N/A N/A Granulation Amount: Large (67-100%) N/A N/A Granulation Quality: Red, Pink N/A N/A Necrotic Amount: Small (1-33%) N/A N/A [name], [name] ([id number]) Exposed Structures: Fascia: No N/A N/A Fat Layer (Subcutaneous Tissue) Exposed: No Tendon: No Muscle: No Joint: No Bone: No Epithelialization: None N/A N/A Debridement: Debridement - Excisional N/A N/A Pre-procedure 14:38 N/A N/A Verification/Time Out Taken: Pain Control: Lidocaine 4% Topical Solution N/A N/A Tissue Debrided: Callus, Subcutaneous, Slough N/A N/A Level: Skin/Subcutaneous Tissue N/A N/A Debridement Area (sq cm): 0.3 N/A N/A Instrument: Curette N/A N/A Bleeding: Minimum N/A N/A Hemostasis Achieved: Pressure N/A N/A Procedural Pain: 0 N/A N/A Post Procedural Pain: 0 N/A N/A Debridement Treatment Procedure was tolerated well N/A N/A Response: Post Debridement 0.6x0.5x0.3 N/A N/A Measurements L x W x D (cm) Post Debridement Volume: 0.071 N/A N/A (cm) Periwound Skin Texture: Callus: Yes N/A N/A Excoriation:  No Induration: No Crepitus: No Rash: No Scarring: No Periwound Skin Moisture: Maceration: Yes N/A N/A Dry/Scaly: No Periwound Skin Color: Erythema: Yes N/A N/A Atrophie Blanche: No Cyanosis: No Ecchymosis: No Hemosiderin Staining: No Mottled: No Pallor: No Rubor: No Erythema Location: Circumferential N/A N/A Temperature: No Abnormality N/A N/A Tenderness on Palpation: Yes N/A N/A Wound Preparation: Ulcer Cleansing: N/A N/A Rinsed/Irrigated with Saline Topical Anesthetic Applied: Other: lidocaine 4% Procedures Performed: Debridement N/A N/A Treatment Notes Wound #1 (Left Toe Great) 1. Cleansed with: Clean wound with Normal Saline [name], Emery A. ([id number]) 2. Anesthetic Topical Lidocaine 4% cream to wound bed prior to debridement 4. Dressing Applied: Prisma Ag 5. Secondary Dressing Applied Dry Gauze Notes conform wrap with Ace wrap applied Electronic Signature(s) Signed: 03/22/2018 3:04:31 PM By: Lawanda Cousins Entered By: Lawanda Cousins on 03/22/2018 15:04:31 [name] ([id number]) -------------------------------------------------------------------------------- [place] Details Patient Name: Heather Dear A. Date of Service: 03/22/2018 2:15 PM Medical Record Number: [id number] Patient Account Number: [id number] Date of Birth/Sex: February 16, 1976 (42 y.o. F) Treating RN: [name] Primary Care Koehn Salehi: [name] Other Clinician: Referring Kathalene Sporer: [name] Treating Kadeshia Kasparian/Extender: [name] in Treatment: 9 Active Inactive ` Abuse / Safety / Falls / Self Care Management Nursing Diagnoses: Potential for falls Goals: Patient will not experience any injury related to falls Date Initiated: 01/18/2018 Target Resolution Date: 04/28/2018 Goal Status: Active Interventions: Assess fall risk on admission and as needed Assess: immobility, friction, shearing, incontinence upon admission and as needed Assess impairment  of mobility on admission and as needed per policy Assess personal safety and home safety (as indicated) on admission and as needed Assess self care needs on admission and as needed Notes: ` Nutrition Nursing Diagnoses: Imbalanced nutrition Impaired glucose control: actual or potential Potential for alteratiion in Nutrition/Potential for imbalanced nutrition Goals: Patient/caregiver agrees to and verbalizes understanding of need to use nutritional supplements and/or vitamins as prescribed Date Initiated: 01/18/2018 Target Resolution Date: 04/28/2018 Goal Status: Active Patient/caregiver will maintain therapeutic glucose control Date Initiated: 01/18/2018 Target Resolution Date: 04/28/2018 Goal Status: Active Interventions: Assess patient nutrition upon admission and as needed per policy Provide education on elevated blood sugars and impact on wound healing Notes: [name], [name] ([id number]) Orientation to the Wound Care Program Nursing Diagnoses: Knowledge deficit related to the wound healing center program Goals: Patient/caregiver will verbalize understanding of the [place] Date Initiated: 01/18/2018 Target Resolution  Date: 01/27/2018 Goal Status: Active Interventions: Provide education on orientation to the wound center Notes: ` Wound/Skin Impairment Nursing Diagnoses: Impaired tissue integrity Knowledge deficit related to smoking impact on wound healing Knowledge deficit related to ulceration/compromised skin integrity Goals: Ulcer/skin breakdown will have a volume reduction of 80% by week 12 Date Initiated: 01/18/2018 Target Resolution Date: 04/21/2018 Goal Status: Active Interventions: Assess patient/caregiver ability to perform ulcer/skin care regimen upon admission and as needed Assess ulceration(s) every visit Notes: Electronic Signature(s) Signed: 03/23/2018 5:38:20 PM By: Alric Quan Entered By: Alric Quan on 03/22/2018  14:37:54 [name], [name] ([id number]) -------------------------------------------------------------------------------- Pain Assessment Details Patient Name: Heather Dear A. Date of Service: 03/22/2018 2:15 PM Medical Record Number: [id number] Patient Account Number: [id number] Date of Birth/Sex: 07/24/1976 (42 y.o. F) Treating RN: [name] Primary Care Rubbie Goostree: [name] Other Clinician: Referring Kealan Buchan: [name] Treating Meoshia Billing/Extender: [name] in Treatment: 9 Active Problems Location of Pain Severity and Description of Pain Patient Has Paino No Site Locations Pain Management and Medication Current Pain Management: Electronic Signature(s) Signed: 03/27/2018 11:57:38 AM By: [name] Entered By: [name] on 03/22/2018 14:18:41 [name] ([id number]) -------------------------------------------------------------------------------- Patient/Caregiver Education Details Patient Name: Heather Dear A. Date of Service: 03/22/2018 2:15 PM Medical Record Number: [id number] Patient Account Number: [id number] Date of Birth/Gender: 08/17/1976 (42 y.o. F) Treating RN: [name] Primary Care Physician: [name] Other Clinician: Referring Physician: Suzan [name] Treating Physician/Extender: [name] in Treatment: 9 Education Assessment Education Provided To: Patient Education Topics Provided Wound Debridement: Handouts: Wound Debridement Methods: Explain/Verbal Responses: State content correctly Wound/Skin Impairment: Handouts: Caring for Your Ulcer Methods: Explain/Verbal Responses: State content correctly Electronic Signature(s) Signed: 03/22/2018 4:00:46 PM By: [name] Entered By: [name] on 03/22/2018 15:02:19 [name], [name] ([id number]) -------------------------------------------------------------------------------- Wound Assessment Details Patient Name: Heather Dear A. Date of Service: 03/22/2018  2:15 PM Medical Record Number: [id number] Patient Account Number: [id number] Date of Birth/Sex: 03-Nov-1975 (42 y.o. F) Treating RN: [name] Primary Care Burdette Gergely: [name] Other Clinician: Referring Ridhima Golberg: [name] Treating Kadelyn Dimascio/Extender: [name] in Treatment: 9 Wound Status Wound Number: 1 Primary Diabetic Wound/Ulcer of the Lower Extremity Etiology: Wound Location: Left Toe Great Wound Open Wounding Event: Gradually Appeared Status: Date Acquired: 01/01/2018 Comorbid Chronic Obstructive Pulmonary Disease Weeks Of Treatment: 9 History: (COPD), Congestive Heart Failure, Clustered Wound: No Hypertension, Type II Diabetes, Neuropathy, Pending Amputation On Presentation Seizure Disorder Photos Wound Measurements Length: (cm) 0.6 % Reduction in Width: (cm) 0.5 % Reduction in Depth: (cm) 0.2 Epithelializati Area: (cm) 0.236 Tunneling: Volume: (cm) 0.047 Undermining: Area: 96.9% Volume: 98.5% on: None No No Wound Description Classification: Grade 2 Foul Odor Afte Wound Margin: Flat and Intact Slough/Fibrino Exudate Amount: Small Exudate Type: Serous Exudate Color: amber r Cleansing: No Yes Wound Bed Granulation Amount: Large (67-100%) Exposed Structure Granulation Quality: Red, Pink Fascia Exposed: No Necrotic Amount: Small (1-33%) Fat Layer (Subcutaneous Tissue) Exposed: No Necrotic Quality: Adherent Slough Tendon Exposed: No Muscle Exposed: No Joint Exposed: No Bone Exposed: No Periwound Skin Texture [name], [name] ([id number]) Texture Color No Abnormalities Noted: No No Abnormalities Noted: No Callus: Yes Atrophie Blanche: No Crepitus: No Cyanosis: No Excoriation: No Ecchymosis: No Induration: No Erythema: Yes Rash: No Erythema Location: Circumferential Scarring: No Hemosiderin Staining: No Mottled: No Moisture Pallor: No No Abnormalities Noted: No Rubor: No Dry / Scaly: No Maceration: Yes Temperature /  Pain Temperature: No Abnormality Tenderness on Palpation: Yes Wound Preparation Ulcer Cleansing: Rinsed/Irrigated with Saline Topical Anesthetic Applied: Other:  lidocaine 4%, Treatment Notes Wound #1 (Left Toe Great) 1. Cleansed with: Clean wound with Normal Saline 2. Anesthetic Topical Lidocaine 4% cream to wound bed prior to debridement 4. Dressing Applied: Prisma Ag 5. Secondary Dressing Applied Dry Gauze Notes conform wrap with Ace wrap applied Electronic Signature(s) Signed: 03/22/2018 2:38:07 PM By: [name] Entered By: [name] on 03/22/2018 14:38:06 [name] ([id number]) -------------------------------------------------------------------------------- [place] Details Patient Name: Heather Dear A. Date of Service: 03/22/2018 2:15 PM Medical Record Number: [id number] Patient Account Number: [id number] Date of Birth/Sex: 12-19-1975 (42 y.o. F) Treating RN: [name] Primary Care Kendale Rembold: [name] Other Clinician: Referring Kinsey Karch: [name] Treating Madylyn Insco/Extender: [name] in Treatment: 9 Vital Signs Time Taken: 14:15 Temperature (F): 99.1 Height (in): 69 Pulse (bpm): 84 Weight (lbs): 250 Respiratory Rate (breaths/min): 16 Body Mass Index (BMI): 36.9 Blood Pressure (mmHg): 114/75 Reference Range: 80 - 120 mg / dl Electronic Signature(s) Signed: 03/27/2018 11:57:38 AM By: [name] Entered BySecundino [name] on 03/22/2018 14:19:17

## 2018-03-29 ENCOUNTER — Encounter: Payer: Medicaid Other | Attending: Nurse Practitioner | Admitting: Nurse Practitioner

## 2018-03-29 DIAGNOSIS — Z881 Allergy status to other antibiotic agents status: Secondary | ICD-10-CM | POA: Diagnosis not present

## 2018-03-29 DIAGNOSIS — F17210 Nicotine dependence, cigarettes, uncomplicated: Secondary | ICD-10-CM | POA: Insufficient documentation

## 2018-03-29 DIAGNOSIS — L97529 Non-pressure chronic ulcer of other part of left foot with unspecified severity: Secondary | ICD-10-CM | POA: Insufficient documentation

## 2018-03-29 DIAGNOSIS — Z9114 Patient's other noncompliance with medication regimen: Secondary | ICD-10-CM | POA: Insufficient documentation

## 2018-03-29 DIAGNOSIS — E1165 Type 2 diabetes mellitus with hyperglycemia: Secondary | ICD-10-CM | POA: Insufficient documentation

## 2018-03-29 DIAGNOSIS — E11621 Type 2 diabetes mellitus with foot ulcer: Secondary | ICD-10-CM | POA: Diagnosis not present

## 2018-03-30 NOTE — Progress Notes (Addendum)
ZHANE, [name] ([id number]) Visit Report for 03/29/2018 Arrival Information Details Patient Name: [name], [name] Date of Service: 03/29/2018 1:45 PM Medical Record Number: [id number] Patient Account Number: [id number] Date of Birth/Sex: 1976/09/28 (42 y.o. F) Treating RN: Secundino Ginger Primary Care Shrihan Putt: Suzan Garibaldi Other Clinician: Referring Orene Abbasi: Suzan Garibaldi Treating Aybree Lanyon/Extender: Cathie Olden in Treatment: 10 Visit Information History Since Last Visit Added or deleted any medications: No Patient Arrived: Wheel Chair Any new allergies or adverse reactions: No Arrival Time: 13:55 Had a fall or experienced change in No Accompanied By: mother activities of daily living that may affect Transfer Assistance: None risk of falls: Patient Identification Verified: Yes Signs or symptoms of abuse/neglect since last visito No Secondary Verification Process Completed: Yes Hospitalized since last visit: No Patient Has Alerts: Yes Implantable device outside of the clinic excluding No Patient Alerts: DMII cellular tissue based products placed in the center since last visit: Has Dressing in Place as Prescribed: Yes Pain Present Now: No Electronic Signature(s) Signed: 03/29/2018 2:49:54 PM By: Secundino Ginger Entered By: Secundino Ginger on 03/29/2018 13:56:58 [name] ([id number]) -------------------------------------------------------------------------------- Encounter Discharge Information Details Patient Name: Heather Dear A. Date of Service: 03/29/2018 1:45 PM Medical Record Number: [id number] Patient Account Number: [id number] Date of Birth/Sex: [birth year] (42 y.o. F) Treating RN: Ahmed Prima Primary Care Keary Hanak: Suzan Garibaldi Other Clinician: Referring Rashawn Rolon: Suzan Garibaldi Treating Adir Schicker/Extender: Cathie Olden in Treatment: 10 Encounter Discharge Information Items Discharge Condition: Stable Ambulatory Status: Wheelchair Discharge Destination:  Home Transportation: Private Auto Accompanied By: mother Schedule Follow-up Appointment: Yes Clinical Summary of Care: Provided Form Type Recipient Paper Patient ks Electronic Signature(s) Signed: 03/29/2018 3:12:29 PM By: Montey Hora Previous Signature: 03/29/2018 2:32:22 PM Version By: Lorine Bears RCP, RRT, CHT Entered By: Montey Hora on 03/29/2018 15:12:29 [name] ([id number]) -------------------------------------------------------------------------------- Lower Extremity Assessment Details Patient Name: Heather Dear A. Date of Service: 03/29/2018 1:45 PM Medical Record Number: [id number] Patient Account Number: [id number] Date of Birth/Sex: 1975-10-30 (42 y.o. F) Treating RN: Secundino Ginger Primary Care Geralynn Capri: Suzan Garibaldi Other Clinician: Referring Dewanna Hurston: Suzan Garibaldi Treating Dvante Hands/Extender: Cathie Olden in Treatment: 10 Edema Assessment Assessed: [Left: No] [Right: No] [Left: Edema] [Right: :] Calf Left: Right: Point of Measurement: 31 cm From Medial Instep 33.1 cm cm Ankle Left: Right: Point of Measurement: 12 cm From Medial Instep 19.6 cm cm Vascular Assessment Claudication: Claudication Assessment [Left:None] Pulses: Dorsalis Pedis Palpable: [Left:Yes] Posterior Tibial Extremity colors, hair growth, and conditions: Extremity Color: [Left:Normal] Hair Growth on Extremity: [Left:Yes] Temperature of Extremity: [Left:Warm] Capillary Refill: [Left:< 3 seconds] Toe Nail Assessment Left: Right: Thick: Yes Discolored: No Deformed: No Improper Length and Hygiene: No Electronic Signature(s) Signed: 03/29/2018 2:49:54 PM By: Secundino Ginger Entered By: Secundino Ginger on 03/29/2018 14:03:33 Skillern, Larosa A. ([id number]) -------------------------------------------------------------------------------- Multi Wound Chart Details Patient Name: Heather Dear A. Date of Service: 03/29/2018 1:45 PM Medical Record Number: [id number] Patient  Account Number: [id number] Date of Birth/Sex: [birth year] (42 y.o. F) Treating RN: Ahmed Prima Primary Care Tay Whitwell: Suzan Garibaldi Other Clinician: Referring Iyahna Obriant: Suzan Garibaldi Treating Kendell Gammon/Extender: Cathie Olden in Treatment: 10 Vital Signs Height(in): 69 Pulse(bpm): 119 Weight(lbs): 250 Blood Pressure(mmHg): 120/94 Body Mass Index(BMI): 37 Temperature(F): 98.5 Respiratory Rate 16 (breaths/min): Photos: [N/A:N/A] Wound Location: Left Toe Great N/A N/A Wounding Event: Gradually Appeared N/A N/A Primary Etiology: Diabetic Wound/Ulcer of the N/A N/A Lower Extremity Comorbid History: Chronic Obstructive N/A N/A Pulmonary Disease (COPD), Congestive Heart Failure, Hypertension, Type II Diabetes, Neuropathy, Seizure Disorder Date Acquired: 01/01/2018  N/A N/A Weeks of Treatment: 10 N/A N/A Wound Status: Open N/A N/A Pending Amputation on Yes N/A N/A Presentation: Measurements L x W x D 0.4x0.2x0.3 N/A N/A (cm) Area (cm) : 0.063 N/A N/A Volume (cm) : 0.019 N/A N/A % Reduction in Area: 99.20% N/A N/A % Reduction in Volume: 99.40% N/A N/A Classification: Grade 2 N/A N/A Exudate Amount: Small N/A N/A Exudate Type: Serous N/A N/A Exudate Color: amber N/A N/A Wound Margin: Flat and Intact N/A N/A Granulation Amount: Large (67-100%) N/A N/A Granulation Quality: Red, Pink N/A N/A Necrotic Amount: Small (1-33%) N/A N/A Exposed Structures: Fat Layer (Subcutaneous N/A N/A Tissue) Exposed: Yes [place], Melodee A. ([id number]) Fascia: No Tendon: No Muscle: No Joint: No Bone: No Epithelialization: None N/A N/A Debridement: Debridement - Excisional N/A N/A Pre-procedure 14:14 N/A N/A Verification/Time Out Taken: Pain Control: Lidocaine 4% Topical Solution N/A N/A Tissue Debrided: Callus, Subcutaneous, Slough N/A N/A Level: Skin/Subcutaneous Tissue N/A N/A Debridement Area (sq cm): 0.08 N/A N/A Instrument: Curette N/A N/A Bleeding: Minimum N/A  N/A Hemostasis Achieved: Pressure N/A N/A Procedural Pain: 0 N/A N/A Post Procedural Pain: 0 N/A N/A Debridement Treatment Procedure was tolerated well N/A N/A Response: Post Debridement 0.4x0.2x0.4 N/A N/A Measurements L x W x D (cm) Post Debridement Volume: 0.025 N/A N/A (cm) Periwound Skin Texture: Callus: Yes N/A N/A Excoriation: No Induration: No Crepitus: No Rash: No Scarring: No Periwound Skin Moisture: Maceration: Yes N/A N/A Dry/Scaly: No Periwound Skin Color: Erythema: Yes N/A N/A Atrophie Blanche: No Cyanosis: No Ecchymosis: No Hemosiderin Staining: No Mottled: No Pallor: No Rubor: No Erythema Location: Circumferential N/A N/A Temperature: No Abnormality N/A N/A Tenderness on Palpation: No N/A N/A Wound Preparation: Ulcer Cleansing: N/A N/A Rinsed/Irrigated with Saline Topical Anesthetic Applied: Other: lidocaine 4% Procedures Performed: Debridement N/A N/A Treatment Notes Wound #1 (Left Toe Great) 1. Cleansed with: Clean wound with Normal Saline 2. Anesthetic Topical Lidocaine 4% cream to wound bed prior to debridement Brickley, Aribella A. ([id number]) 4. Dressing Applied: Promogran 5. Secondary Dressing Applied Dry Gauze Kerlix/Conform Notes conform wrap with Ace wrap applied Electronic Signature(s) Signed: 03/29/2018 2:57:12 PM By: Lawanda Cousins Previous Signature: 03/29/2018 2:43:37 PM Version By: Alric Quan Entered By: Lawanda Cousins on 03/29/2018 14:57:12 [name] ([id number]) -------------------------------------------------------------------------------- [place] Details Patient Name: Heather Dear A. Date of Service: 03/29/2018 1:45 PM Medical Record Number: [id number] Patient Account Number: [id number] Date of Birth/Sex: [birth year] (42 y.o. F) Treating RN: Ahmed Prima Primary Care Trayden Brandy: Suzan Garibaldi Other Clinician: Referring Sidda Humm: Suzan Garibaldi Treating Judene Logue/Extender: Cathie Olden  in Treatment: 10 Active Inactive ` Abuse / Safety / Falls / Self Care Management Nursing Diagnoses: Potential for falls Goals: Patient will not experience any injury related to falls Date Initiated: 01/18/2018 Target Resolution Date: 04/28/2018 Goal Status: Active Interventions: Assess fall risk on admission and as needed Assess: immobility, friction, shearing, incontinence upon admission and as needed Assess impairment of mobility on admission and as needed per policy Assess personal safety and home safety (as indicated) on admission and as needed Assess self care needs on admission and as needed Notes: ` Nutrition Nursing Diagnoses: Imbalanced nutrition Impaired glucose control: actual or potential Potential for alteratiion in Nutrition/Potential for imbalanced nutrition Goals: Patient/caregiver agrees to and verbalizes understanding of need to use nutritional supplements and/or vitamins as prescribed Date Initiated: 01/18/2018 Target Resolution Date: 04/28/2018 Goal Status: Active Patient/caregiver will maintain therapeutic glucose control Date Initiated: 01/18/2018 Target Resolution Date: 04/28/2018 Goal Status: Active Interventions: Assess patient nutrition upon admission and  as needed per policy Provide education on elevated blood sugars and impact on wound healing Notes: JORIE, ZEE A. ([id number]) Orientation to the Wound Care Program Nursing Diagnoses: Knowledge deficit related to the wound healing center program Goals: Patient/caregiver will verbalize understanding of the [place] Date Initiated: 01/18/2018 Target Resolution Date: 01/27/2018 Goal Status: Active Interventions: Provide education on orientation to the wound center Notes: ` Wound/Skin Impairment Nursing Diagnoses: Impaired tissue integrity Knowledge deficit related to smoking impact on wound healing Knowledge deficit related to ulceration/compromised skin  integrity Goals: Ulcer/skin breakdown will have a volume reduction of 80% by week 12 Date Initiated: 01/18/2018 Target Resolution Date: 04/21/2018 Goal Status: Active Interventions: Assess patient/caregiver ability to perform ulcer/skin care regimen upon admission and as needed Assess ulceration(s) every visit Notes: Electronic Signature(s) Signed: 03/29/2018 2:43:37 PM By: Alric Quan Entered By: Alric Quan on 03/29/2018 [place], Georgianna A. ([id number]) -------------------------------------------------------------------------------- Pain Assessment Details Patient Name: Heather Dear A. Date of Service: 03/29/2018 1:45 PM Medical Record Number: [id number] Patient Account Number: [id number] Date of Birth/Sex: 12/08/1975 (42 y.o. F) Treating RN: Secundino Ginger Primary Care Pollyanna Levay: Suzan Garibaldi Other Clinician: Referring Signa Cheek: Suzan Garibaldi Treating Traeton Bordas/Extender: Cathie Olden in Treatment: 10 Active Problems Location of Pain Severity and Description of Pain Patient Has Paino No Site Locations Pain Management and Medication Current Pain Management: Electronic Signature(s) Signed: 03/29/2018 2:49:54 PM By: Secundino Ginger Entered By: Secundino Ginger on 03/29/2018 13:57:09 [name] ([id number]) -------------------------------------------------------------------------------- Patient/Caregiver Education Details Patient Name: Heather Dear A. Date of Service: 03/29/2018 1:45 PM Medical Record Number: [id number] Patient Account Number: [id number] Date of Birth/Gender: May 11, 1976 (42 y.o. F) Treating RN: Montey Hora Primary Care Physician: Suzan Garibaldi Other Clinician: Referring Physician: Suzan Garibaldi Treating Physician/Extender: Cathie Olden in Treatment: 10 Education Assessment Education Provided To: Patient Education Topics Provided Wound/Skin Impairment: Handouts: Other: wound care as ordered Methods: Demonstration, Explain/Verbal Responses:  State content correctly Electronic Signature(s) Signed: 03/29/2018 4:04:19 PM By: Montey Hora Entered By: Montey Hora on 03/29/2018 15:13:19 Avitabile, Sharlet A. ([id number]) -------------------------------------------------------------------------------- Wound Assessment Details Patient Name: Heather Dear A. Date of Service: 03/29/2018 1:45 PM Medical Record Number: [id number] Patient Account Number: [id number] Date of Birth/Sex: 06/28/1976 (42 y.o. F) Treating RN: Secundino Ginger Primary Care Zadrian Mccauley: Suzan Garibaldi Other Clinician: Referring Kaikoa Magro: Suzan Garibaldi Treating Mairim Bade/Extender: Cathie Olden in Treatment: 10 Wound Status Wound Number: 1 Primary Diabetic Wound/Ulcer of the Lower Extremity Etiology: Wound Location: Left Toe Great Wound Open Wounding Event: Gradually Appeared Status: Date Acquired: 01/01/2018 Comorbid Chronic Obstructive Pulmonary Disease Weeks Of Treatment: 10 History: (COPD), Congestive Heart Failure, Clustered Wound: No Hypertension, Type II Diabetes, Neuropathy, Pending Amputation On Presentation Seizure Disorder Photos Photo Uploaded By: Secundino Ginger on 03/29/2018 14:20:10 Wound Measurements Length: (cm) 0.4 % Reduction Width: (cm) 0.2 % Reduction Depth: (cm) 0.3 Epitheliali Area: (cm) 0.063 Tunneling: Volume: (cm) 0.019 Underminin in Area: 99.2% in Volume: 99.4% zation: None No g: No Wound Description Classification: Grade 2 Foul Odor A Wound Margin: Flat and Intact Slough/Fibr Exudate Amount: Small Exudate Type: Serous Exudate Color: amber fter Cleansing: No ino Yes Wound Bed Granulation Amount: Large (67-100%) Exposed Structure Granulation Quality: Red, Pink Fascia Exposed: No Necrotic Amount: Small (1-33%) Fat Layer (Subcutaneous Tissue) Exposed: Yes Necrotic Quality: Adherent Slough Tendon Exposed: No Muscle Exposed: No Joint Exposed: No Bone Exposed: No Periwound Skin Texture Texture Color Vanderhoef, Kendrick A.  ([id number]) No Abnormalities Noted: No No Abnormalities Noted: No Callus: Yes Atrophie Blanche: No Crepitus: No Cyanosis: No Excoriation:  No Ecchymosis: No Induration: No Erythema: Yes Rash: No Erythema Location: Circumferential Scarring: No Hemosiderin Staining: No Mottled: No Moisture Pallor: No No Abnormalities Noted: No Rubor: No Dry / Scaly: No Maceration: Yes Temperature / Pain Temperature: No Abnormality Wound Preparation Ulcer Cleansing: Rinsed/Irrigated with Saline Topical Anesthetic Applied: Other: lidocaine 4%, Treatment Notes Wound #1 (Left Toe Great) 1. Cleansed with: Clean wound with Normal Saline 2. Anesthetic Topical Lidocaine 4% cream to wound bed prior to debridement 4. Dressing Applied: Promogran 5. Secondary Dressing Applied Dry Gauze Kerlix/Conform Notes conform wrap with Ace wrap applied Electronic Signature(s) Signed: 03/29/2018 2:49:54 PM By: Secundino Ginger Entered By: Secundino Ginger on 03/29/2018 14:02:11 Pagnotta, Belenda A. ([id number]) -------------------------------------------------------------------------------- [place] Details Patient Name: Heather Dear A. Date of Service: 03/29/2018 1:45 PM Medical Record Number: [id number] Patient Account Number: [id number] Date of Birth/Sex: 1975-11-17 (42 y.o. F) Treating RN: Secundino Ginger Primary Care Blayke Pinera: Suzan Garibaldi Other Clinician: Referring Thedford Bunton: Suzan Garibaldi Treating Somaly Marteney/Extender: Cathie Olden in Treatment: 10 Vital Signs Time Taken: 13:57 Temperature (F): 98.5 Height (in): 69 Pulse (bpm): 119 Weight (lbs): 250 Respiratory Rate (breaths/min): 16 Body Mass Index (BMI): 36.9 Blood Pressure (mmHg): 120/94 Reference Range: 80 - 120 mg / dl Electronic Signature(s) Signed: 03/29/2018 2:49:54 PM By: Secundino Ginger Entered By: Secundino Ginger on 03/29/2018 13:57:56

## 2018-03-30 NOTE — Progress Notes (Addendum)
STACEYANN, KNOUFF ([id number]) Visit Report for 03/29/2018 Chief Complaint Document Details Patient Name: [name], [name] Date of Service: 03/29/2018 1:45 PM Medical Record Number: [id number] Patient Account Number: [id number] Date of Birth/Sex: 21-Jan-1976 (42 y.o. F) Treating RN: Ahmed Prima Primary Care Provider: Suzan Garibaldi Other Clinician: Referring Provider: Suzan Garibaldi Treating Provider/Extender: Cathie Olden in Treatment: 10 Information Obtained from: Patient Chief Complaint She is here for evaluation of a left great toe ulcer Electronic Signature(s) Signed: 03/29/2018 2:57:51 PM By: Lawanda Cousins Entered By: Lawanda Cousins on 03/29/2018 14:57:51 Martone, Altie A. ([id number]) -------------------------------------------------------------------------------- Debridement Details Patient Name: Heather Dear A. Date of Service: 03/29/2018 1:45 PM Medical Record Number: [id number] Patient Account Number: [id number] Date of Birth/Sex: 10/23/1976 (42 y.o. F) Treating RN: Ahmed Prima Primary Care Provider: Suzan Garibaldi Other Clinician: Referring Provider: Suzan Garibaldi Treating Provider/Extender: Cathie Olden in Treatment: 10 Debridement Performed for Wound #1 Left Toe Great Assessment: Performed By: Physician Lawanda Cousins, NP Debridement Type: Debridement Severity of Tissue Pre Fat layer exposed Debridement: Pre-procedure Verification/Time Yes - 14:14 Out Taken: Start Time: 14:14 Pain Control: Lidocaine 4% Topical Solution Total Area Debrided (L x W): 0.4 (cm) x 0.2 (cm) = 0.08 (cm) Tissue and other material Viable, Non-Viable, Callus, Slough, Subcutaneous, Slough debrided: Level: Skin/Subcutaneous Tissue Debridement Description: Excisional Instrument: Curette Bleeding: Minimum Hemostasis Achieved: Pressure End Time: 14:18 Procedural Pain: 0 Post Procedural Pain: 0 Response to Treatment: Procedure was tolerated well Level of Consciousness:  Awake and Alert Post Procedure Vitals: Temperature: 98.5 Pulse: 119 Respiratory Rate: 18 Blood Pressure: Systolic Blood Pressure: 545 Diastolic Blood Pressure: 94 Post Debridement Measurements of Total Wound Length: (cm) 0.4 Width: (cm) 0.2 Depth: (cm) 0.4 Volume: (cm) 0.025 Character of Wound/Ulcer Post Debridement: Requires Further Debridement Severity of Tissue Post Debridement: Fat layer exposed Post Procedure Diagnosis Same as Pre-procedure Electronic Signature(s) Signed: 03/29/2018 2:57:42 PM By: Lawanda Cousins Signed: 03/29/2018 4:25:44 PM By: Belia Heman, Abran Duke ([id number]) Previous Signature: 03/29/2018 2:43:37 PM Version By: Alric Quan Entered By: Lawanda Cousins on 03/29/2018 14:57:42 [place], [name] ([id number]) -------------------------------------------------------------------------------- HPI Details Patient Name: Heather Dear A. Date of Service: 03/29/2018 1:45 PM Medical Record Number: [id number] Patient Account Number: [id number] Date of Birth/Sex: 1975-12-29 (42 y.o. F) Treating RN: Ahmed Prima Primary Care Provider: Suzan Garibaldi Other Clinician: Referring Provider: Suzan Garibaldi Treating Provider/Extender: Cathie Olden in Treatment: 10 History of Present Illness HPI Description: 01/18/18-She is here for initial evaluation of the left great toe ulcer. She is a poor historian in regards to timeframe in detail. She states approximately 4 weeks ago she lacerated her toe on something in the house. She followed up with her primary care who placed her on Bactrim and ultimately a second dose of Bactrim prior to coming to wound clinic. She states she has been treating the toe with peroxide, Betadine and a Band-Aid. She did not check her blood sugar this morning but checked it yesterday morning it was 327; she is unaware of a recent A1c and there are no current records. She saw Dr. she would've orthopedics last week for an old injury to the  left ankle, she states he did not see her toe, nor did she bring it to his attention. She smokes approximately 1 pack cigarettes a day. Her social situation is concerning, she arrives this morning with her mother who appears extremely intoxicated/under the influence; her mother was asked to leave the room and be monitored by the patient's grandmother. The patient's aunt then accompanied the patient  and the room throughout the rest of the appointment. We had a lengthy discussion regarding the deleterious effects of uncontrolled hyperglycemia and smoking as it relates to wound healing and overall health. She was strongly encouraged to decrease her smoking and get her diabetes under better control. She states she is currently on a diet and has cut down her [facility] consumption. The left toe is erythematous, macerated and slightly edematous with malodor present. The edema in her left foot is below her baseline, there is no erythema streaking. We will treat her with Santyl, doxycycline; we have ordered and xray, culture and provided a Peg assist surgical shoe and cultured the wound. 01/25/18-She is here in follow-up evaluation for a left great toe ulcer and presents with an abscess to her suprapubic area. She states her blood sugars remain elevated, feeling "sick" and if levels are below 250, but she is trying. She has made no attempt to decrease her smoking stating that we "can't take away her food in her cigarettes". She has been compliant with offloading using the PEG assist you. She is using Santyl daily. the culture obtained last week grew staph aureus and Enterococcus faecalis; continues on the doxycycline and Augmentin was added on Monday. The suprapubic area has erythema, no femoral variation, purple discoloration, minimal induration, was accessed with a cotton tip applicator with sanguinopurulent drainage, this was cultured, I suspect the current antibiotic treatment will cover and we will not  add anything to her current treatment plan. She was advised to go to urgent care or ER with any change in redness, induration or fever. 02/01/18-She is here in follow-up evaluation for left great toe ulcers and a new abdominal abscess from last week. She was able to use packing until earlier this week, where she "forgot it was there". She states she was feeling ill with GI symptoms last week and was not taking her antibiotic. She states her glucose levels have been predominantly less than 200, with occasional levels between 200-250. She thinks this was contributing to her GI symptoms as they have resolved without intervention. There continues to be significant laceration to left toe, otherwise it clinically looks stable/improved. There is now less superficial opening to the lateral aspect of the great toe that was residual blister. We will transition to [facility] to all wounds, she will continue her Augmentin. If there is no change or deterioration next week for reculture. 02/08/18-She is here in follow-up evaluation for left great toe ulcer and abdominal ulcer. There is an improvement in both wounds. She has been wrapping her left toe with coban, not by our direction, which has created an area of discoloration to the medial aspect; she has been advised to NOT use coban secondary to her neuropathy. She states her glucose levels have been high over this last week ranging from 200-350, she continues to smoke. She admits to being less compliant with her offloading shoe. We will continue with same treatment plan and she will follow-up next week. 02/15/18-She is here in follow-up evaluation for left great toe ulcer and abdominal ulcer. The abdominal ulcer is epithelialized. The left great toe ulcer is improved and all injury from last week using the Coban wrap is resolved, the lateral ulcer is healed. She admits to noncompliance with wearing offloading shoe and admits to glucose levels being greater  than 300 most of the week. She continues to smoke and expresses no desire to quit. There is one area medially that probes deeper than it has historically,  erythema to the toe and dorsal foot has consistently waxed and waned. There is no overt signs of cellulitis or infection but we will culture the wound for any occult infection given the new area of depth and erythema. We will hold off on sensitivities for initiation of antibiotic therapy. 02/22/18-She is here in follow up evaluation for left great toe ulcer. There is overall significant improvement in both wound appearance, erythema and edema with changes made last week. She was not initiated on antibiotic therapy. Culture obtained last week showed oxacillin sensitive staph aureus, sensitive to clindamycin. Clindamycin has been called into the pharmacy but she has been instructed to hold off on initiation secondary to overall clinical improvement and her history of antibiotic intolerance. She has been instructed to contact the clinic with any noted changes/deterioration and the wound, erythema, Tung[place], Nadie A. ([id number]) edema and/or pain. She will follow-up next week. She continues to smoke and her glucose levels remain elevated >250; she admits to compliance with offloading shoe 03/01/18 on evaluation today patient appears to be doing fairly well in regard to her left first toe ulcer. She has been tolerating the dressing changes with the [facility] Dressing without complication and overall this has definitely showed signs of improvement according to records as well is what the patient tells me today. I'm very pleased in that regard. She is having no pain today 03/08/18 She is here for follow up evaluation of a left great toe ulcer. She remains non-compliant with glucose control and smoking cessation; glucose levels consistently >200. She states that she got new shoe inserts/peg assist. She admits to compliance with offloading. Since my last  evaluation there is significant improvement. We will switch to prisma at this time and she will follow up next week. She is noted to be tachycardic at this appointment, heart rate 120s; she has a history of heart rate 70-130 according to our records. She admits to extreme agitation r/t personal issues; she was advised to monitor her heartrate and contact her physician if it does not return to a more normal range (<100). She takes cardizem twice daily. 03/15/18-She is here in follow-up evaluation for left great toe ulcer. She remains noncompliant with glucose control and smoking cessation. She admits to compliance with wearing offloading shoe. The ulcer is improved/stable and we will continue with the same treatment plan and she will follow-up next week 03/22/18-She is here for evaluation for left great toe ulcer. There continues to be significant improvement despite recurrent hyperglycemia (over 500 yesterday) and she continues to smoke. She has been compliant with offloading and we will continue with same treatment plan and she will follow-up next week. 03/29/18-She is here for evaluation for left great toe ulcer. Despite continuing to smoke and uncontrolled diabetes she continues to improve. She is compliant with offloading shoe. We will continue with the same treatment plan and she will follow-up next week Electronic Signature(s) Signed: 04/05/2018 2:11:49 PM By: Lawanda Cousins Previous Signature: 03/29/2018 2:58:26 PM Version By: Lawanda Cousins Entered By: Lawanda Cousins on 04/05/2018 14:11:49 [name] ([id number]) -------------------------------------------------------------------------------- Physician Orders Details Patient Name: Heather Dear A. Date of Service: 03/29/2018 1:45 PM Medical Record Number: [id number] Patient Account Number: [id number] Date of Birth/Sex: [birth year] (42 y.o. F) Treating RN: Ahmed Prima Primary Care Provider: Suzan Garibaldi Other Clinician: Referring  Provider: Suzan Garibaldi Treating Provider/Extender: Cathie Olden in Treatment: 10 Verbal / Phone Orders: Yes Clinician: Carolyne Fiscal, Debi Read Back and Verified: Yes Diagnosis Coding Wound Cleansing Wound #1  Left Toe Great o Clean wound with Normal Saline. o Cleanse wound with mild soap and water o May Shower, gently pat wound dry prior to applying new dressing. Anesthetic (add to Medication List) Wound #1 Left Toe Great o Topical Lidocaine 4% cream applied to wound bed prior to debridement (In Clinic Only). Primary Wound Dressing Wound #1 Left Toe Great o Collagen Secondary Dressing Wound #1 Left Toe Great o Dry Gauze o Conform/Kerlix Dressing Change Frequency Wound #1 Left Toe Great o Change dressing every other day. o Other: - as needed Follow-up Appointments Wound #1 Left Toe Great o Return Appointment in 1 week. Edema Control Wound #1 Left Toe Great o Elevate legs to the level of the heart and pump ankles as often as possible o Other: - Ace bandage Off-Loading Wound #1 Left Toe Great o Open toe surgical shoe with peg assist. Additional Orders / Instructions Wound #1 Left Toe Great o Stop Smoking o Increase protein intake. [name], REVERON ([id number]) Patient Medications Allergies: erythromycin base Notifications Medication Indication Start End lidocaine DOSE 1 - topical 4 % cream - 1 cream topical Electronic Signature(s) Signed: 03/29/2018 2:43:37 PM By: Alric Quan Signed: 03/29/2018 4:23:13 PM By: Lawanda Cousins Entered By: Alric Quan on 03/29/2018 14:20:50 [name] ([id number]) -------------------------------------------------------------------------------- Prescription 03/29/2018 Patient Name: Heather Dear A. Provider: Lawanda Cousins NP Date of Birth: 05/14/1976 NPI#: [id number] Sex: F DEA#: [id number] Phone #: [phone] License #: Patient Address: [facility] [place], [place] [address] [address] [place], [place] [address] [phone] Allergies erythromycin base Medication Medication: Route: Strength: Form: lidocaine topical 4% cream Class: TOPICAL LOCAL ANESTHETICS Dose: Frequency / Time: Indication: 1 1 cream topical Number of Refills: Number of Units: 0 Generic Substitution: Start Date: End Date: Administered at [place]: Yes Time Administered: Time Discontinued: Note to Pharmacy: Signature(s): Date(s): Electronic Signature(s) Signed: 03/29/2018 2:43:37 PM By: Alric Quan Signed: 03/29/2018 4:23:13 PM By: Lawanda Cousins Entered By: Alric Quan on 03/29/2018 14:20:51 [name] ([id number]) [name], Briah A. ([id number]) --------------------------------------------------------------------------------  Problem List Details Patient Name: Heather Dear A. Date of Service: 03/29/2018 1:45 PM Medical Record Number: [id number] Patient Account Number: [id number] Date of Birth/Sex: 27-Aug-1976 (42 y.o. 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 ([id number]) -------------------------------------------------------------------------------- Progress Note Details Patient Name: Heather Dear A. Date of Service: 03/29/2018 1:45 PM Medical Record Number: [id number] Patient Account Number: [id number] Date of Birth/Sex: 1976-10-06 (42 y.o. F) Treating RN: Ahmed Prima Primary Care Provider: Suzan Garibaldi Other Clinician: Referring Provider: Suzan Garibaldi Treating Provider/Extender: Cathie Olden in Treatment: 10 Subjective Chief Complaint Information obtained from Patient She is here for evaluation of a left great toe ulcer History of Present Illness (HPI) 01/18/18-She is here for initial evaluation of the  left great toe ulcer. She is a poor historian in regards to timeframe in detail. She states approximately 4 weeks ago she lacerated her toe on something in the house. She followed up with her primary care who placed her on Bactrim and ultimately a second dose of Bactrim prior to coming to wound clinic. She states she has been treating the toe with peroxide, Betadine and a Band-Aid. She did not check her blood sugar this morning but checked it yesterday morning it was 327; she is unaware of a recent A1c and there are no current records. She saw Dr. she would've orthopedics last week for an old injury to the left ankle, she states he did not see her toe, nor did she bring it to his attention. She smokes approximately 1 pack cigarettes a day. Her social situation is concerning, she arrives this morning with her mother who appears extremely intoxicated/under the influence; her mother was asked to leave the room and be monitored by the patient's grandmother. The patient's aunt then accompanied the patient and the room throughout the rest of the appointment. We had a lengthy discussion regarding the deleterious effects of uncontrolled hyperglycemia and smoking as  it relates to wound healing and overall health. She was strongly encouraged to decrease her smoking and get her diabetes under better control. She states she is currently on a diet and has cut down her [facility] consumption. The left toe is erythematous, macerated and slightly edematous with malodor present. The edema in her left foot is below her baseline, there is no erythema streaking. We will treat her with Santyl, doxycycline; we have ordered and xray, culture and provided a Peg assist surgical shoe and cultured the wound. 01/25/18-She is here in follow-up evaluation for a left great toe ulcer and presents with an abscess to her suprapubic area. She states her blood sugars remain elevated, feeling "sick" and if levels are below 250, but she is trying. She has made no attempt to decrease her smoking stating that we "can't take away her food in her cigarettes". She has been compliant with offloading using the PEG assist you. She is using Santyl daily. the culture obtained last week grew staph aureus and Enterococcus faecalis; continues on the doxycycline and Augmentin was added on Monday. The suprapubic area has erythema, no femoral variation, purple discoloration, minimal induration, was accessed with a cotton tip applicator with sanguinopurulent drainage, this was cultured, I suspect the current antibiotic treatment will cover and we will not add anything to her current treatment plan. She was advised to go to urgent care or ER with any change in redness, induration or fever. 02/01/18-She is here in follow-up evaluation for left great toe ulcers and a new abdominal abscess from last week. She was able to use packing until earlier this week, where she "forgot it was there". She states she was feeling ill with GI symptoms last week and was not taking her antibiotic. She states her glucose levels have been predominantly less than 200, with occasional levels between 200-250. She thinks this was  contributing to her GI symptoms as they have resolved without intervention. There continues to be significant laceration to left toe, otherwise it clinically looks stable/improved. There is now less superficial opening to the lateral aspect of the great toe that was residual blister. We will transition to [facility] to all wounds, she will continue her Augmentin. If there is no change or deterioration next week for reculture. 02/08/18-She is here in follow-up evaluation  for left great toe ulcer and abdominal ulcer. There is an improvement in both wounds. She has been wrapping her left toe with coban, not by our direction, which has created an area of discoloration to the medial aspect; she has been advised to NOT use coban secondary to her neuropathy. She states her glucose levels have been high over this last week ranging from 200-350, she continues to smoke. She admits to being less compliant with her offloading shoe. We will continue with same treatment plan and she will follow-up next week. 02/15/18-She is here in follow-up evaluation for left great toe ulcer and abdominal ulcer. The abdominal ulcer is epithelialized. The left great toe ulcer is improved and all injury from last week using the Coban wrap is resolved, the lateral ulcer is healed. She admits to noncompliance with wearing offloading shoe and admits to glucose levels being greater than 300 most of the week. She continues to smoke and expresses no desire to quit. There is one area medially that probes deeper than it has historically, erythema to the toe and dorsal foot has consistently waxed and waned. There is no overt signs of cellulitis or infection but we will culture the wound for any occult infection given the new area of depth and erythema. We will hold off on Mcphie, Marlow A. ([id number]) sensitivities for initiation of antibiotic therapy. 02/22/18-She is here in follow up evaluation for left great toe ulcer. There is overall  significant improvement in both wound appearance, erythema and edema with changes made last week. She was not initiated on antibiotic therapy. Culture obtained last week showed oxacillin sensitive staph aureus, sensitive to clindamycin. Clindamycin has been called into the pharmacy but she has been instructed to hold off on initiation secondary to overall clinical improvement and her history of antibiotic intolerance. She has been instructed to contact the clinic with any noted changes/deterioration and the wound, erythema, edema and/or pain. She will follow-up next week. She continues to smoke and her glucose levels remain elevated >250; she admits to compliance with offloading shoe 03/01/18 on evaluation today patient appears to be doing fairly well in regard to her left first toe ulcer. She has been tolerating the dressing changes with the [facility] Dressing without complication and overall this has definitely showed signs of improvement according to records as well is what the patient tells me today. I'm very pleased in that regard. She is having no pain today 03/08/18 She is here for follow up evaluation of a left great toe ulcer. She remains non-compliant with glucose control and smoking cessation; glucose levels consistently >200. She states that she got new shoe inserts/peg assist. She admits to compliance with offloading. Since my last evaluation there is significant improvement. We will switch to prisma at this time and she will follow up next week. She is noted to be tachycardic at this appointment, heart rate 120s; she has a history of heart rate 70-130 according to our records. She admits to extreme agitation r/t personal issues; she was advised to monitor her heartrate and contact her physician if it does not return to a more normal range (<100). She takes cardizem twice daily. 03/15/18-She is here in follow-up evaluation for left great toe ulcer. She remains noncompliant with glucose  control and smoking cessation. She admits to compliance with wearing offloading shoe. The ulcer is improved/stable and we will continue with the same treatment plan and she will follow-up next week 03/22/18-She is here for evaluation for left great toe ulcer.  There continues to be significant improvement despite recurrent hyperglycemia (over 500 yesterday) and she continues to smoke. She has been compliant with offloading and we will continue with same treatment plan and she will follow-up next week. 03/29/18-She is here for evaluation for left great toe ulcer. Despite continuing to smoke and uncontrolled diabetes she continues to improve. She is compliant with offloading shoe. We will continue with the same treatment plan and she will follow-up next week Patient History Information obtained from Patient. Family History Cancer - Paternal Grandparents, Heart Disease - Paternal Grandparents, Hypertension - Paternal Grandparents, No family history of Diabetes, Hereditary Spherocytosis, Kidney Disease, Lung Disease, Seizures, Stroke, Thyroid Problems, Tuberculosis. Social History Current every day smoker, Marital Status - Single, Alcohol Use - Never, Drug Use - Prior History, Caffeine Use - Daily. Objective Constitutional Vitals Time Taken: 1:57 PM, Height: 69 in, Weight: 250 lbs, BMI: 36.9, Temperature: 98.5 F, Pulse: 119 bpm, Respiratory Rate: 16 breaths/min, Blood Pressure: 120/94 mmHg. Integumentary (Hair, Skin) Christenson, Skarleth A. ([id number]) Wound #1 status is Open. Original cause of wound was Gradually Appeared. The wound is located on the Left Toe Great. The wound measures 0.4cm length x 0.2cm width x 0.3cm depth; 0.063cm^2 area and 0.019cm^3 volume. There is Fat Layer (Subcutaneous Tissue) Exposed exposed. There is no tunneling or undermining noted. There is a small amount of serous drainage noted. The wound margin is flat and intact. There is large (67-100%) red, pink granulation within  the wound bed. There is a small (1-33%) amount of necrotic tissue within the wound bed including Adherent Slough. The periwound skin appearance exhibited: Callus, Maceration, Erythema. The periwound skin appearance did not exhibit: Crepitus, Excoriation, Induration, Rash, Scarring, Dry/Scaly, Atrophie Blanche, Cyanosis, Ecchymosis, Hemosiderin Staining, Mottled, Pallor, Rubor. The surrounding wound skin color is noted with erythema which is circumferential. Periwound temperature was noted as No Abnormality. 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 There was a Excisional Skin/Subcutaneous Tissue Debridement with a total area of 0.08 sq cm performed by Lawanda Cousins, NP. With the following instrument(s): Curette to remove Viable and Non-Viable tissue/material. Material removed includes Callus, Subcutaneous Tissue, and Slough after achieving pain control using Lidocaine 4% Topical Solution. A time out was conducted at 14:14, prior to the start of the procedure. A Minimum amount of bleeding was controlled with Pressure. The procedure was tolerated well with a pain level of 0 throughout and a pain level of 0 following the procedure. Patient s Level of Consciousness post procedure was recorded as Awake and Alert. Post Debridement Measurements: 0.4cm length x 0.2cm width x 0.4cm depth; 0.025cm^3 volume. Character of Wound/Ulcer Post Debridement requires further debridement. Severity of Tissue Post Debridement is: Fat layer exposed. Post procedure Diagnosis Wound #1: Same as Pre-Procedure Plan Wound Cleansing: Wound #1 Left Toe Great: Clean wound with Normal Saline. Cleanse wound with mild soap  and water May Shower, gently pat wound dry prior to applying new dressing. Anesthetic (add to Medication List): Wound #1 Left Toe Great: Topical Lidocaine 4% cream applied to wound bed prior to debridement (In Clinic Only). Primary Wound Dressing: Wound #1 Left Toe GreatSHANTERA, MONTS A. ([id number]) Collagen Secondary Dressing: Wound #1 Left Toe Great: Dry Gauze Conform/Kerlix Dressing Change Frequency: Wound #1  Left Toe Great: Change dressing every other day. Other: - as needed Follow-up Appointments: Wound #1 Left Toe Great: Return Appointment in 1 week. Edema Control: Wound #1 Left Toe Great: Elevate legs to the level of the heart and pump ankles as often as possible Other: - Ace bandage Off-Loading: Wound #1 Left Toe Great: Open toe surgical shoe with peg assist. Additional Orders / Instructions: Wound #1 Left Toe Great: Stop Smoking Increase protein intake. The following medication(s) was prescribed: lidocaine topical 4 % cream 1 1 cream topical was prescribed at facility Electronic Signature(s) Signed: 04/05/2018 2:12:20 PM By: Lawanda Cousins Previous Signature: 03/29/2018 2:58:51 PM Version By: Lawanda Cousins Entered By: Lawanda Cousins on 04/05/2018 14:12:20 [name] ([id number]) -------------------------------------------------------------------------------- ROS/PFSH Details Patient Name: Heather Dear A. Date of Service: 03/29/2018 1:45 PM Medical Record Number: [id number] Patient Account Number: [id number] Date of Birth/Sex: November 07, 1975 (42 y.o. F) Treating RN: Ahmed Prima Primary Care Provider: Suzan Garibaldi Other Clinician: Referring Provider: Suzan Garibaldi Treating Provider/Extender: Cathie Olden in Treatment: 10 Information Obtained From Patient Wound History Do you currently have one or more open woundso Yes How many open wounds do you currently haveo 1 Approximately how long have you had your woundso 1 month How have you been treating  your wound(s) until nowo open to air Has your wound(s) ever healed and then re-openedo No Have you had any lab work done in the past montho No Have you tested positive for an antibiotic resistant organism (MRSA, VRE)o No Have you tested positive for osteomyelitis (bone infection)o No Have you had any tests for circulation on your legso No Hematologic/Lymphatic Medical History: Negative for: Anemia; Hemophilia; Human Immunodeficiency Virus; Lymphedema; Sickle Cell Disease Respiratory Medical History: Positive for: Chronic Obstructive Pulmonary Disease (COPD) Negative for: Aspiration; Asthma; Pneumothorax; Sleep Apnea; Tuberculosis Cardiovascular Medical History: Positive for: Congestive Heart Failure; Hypertension Negative for: Angina; Arrhythmia; Coronary Artery Disease; Deep Vein Thrombosis; Hypotension; Myocardial Infarction; Peripheral Arterial Disease; Peripheral Venous Disease; Phlebitis; Vasculitis Gastrointestinal Medical History: Negative for: Cirrhosis ; Colitis; Crohnos; Hepatitis A; Hepatitis B; Hepatitis C Endocrine Medical History: Positive for: Type II Diabetes Treated with: Insulin, Oral agents Genitourinary Medical History: Negative for: End Stage Renal Disease Immunological JENNISE, BOTH A. ([id number]) Medical History: Negative for: Lupus Erythematosus; Raynaudos; Scleroderma Musculoskeletal Medical History: Negative for: Gout; Rheumatoid Arthritis; Osteoarthritis; Osteomyelitis Neurologic Medical History: Positive for: Neuropathy; Seizure Disorder Oncologic Medical History: Negative for: Received Chemotherapy; Received Radiation Immunizations Pneumococcal Vaccine: Received Pneumococcal Vaccination: Yes Immunization Notes: up to date Implantable Devices Family and Social History Cancer: Yes - Paternal Grandparents; Diabetes: No; Heart Disease: Yes - Paternal Grandparents; Hereditary Spherocytosis: No; Hypertension: Yes - Paternal Grandparents; Kidney  Disease: No; Lung Disease: No; Seizures: No; Stroke: No; Thyroid Problems: No; Tuberculosis: No; Current every day smoker; Marital Status - Single; Alcohol Use: Never; Drug Use: Prior History; Caffeine Use: Daily; Financial Concerns: No; Food, Clothing or Shelter Needs: No; Support System Lacking: No; Transportation Concerns: No; Advanced Directives: No; Patient does not want information on Advanced Directives Physician Affirmation I have reviewed and agree with the above information. Electronic Signature(s) Signed: 03/29/2018 4:23:13 PM By: Lawanda Cousins Signed: 03/29/2018 4:25:44 PM By: Alric Quan Entered By: Lawanda Cousins on 03/29/2018 14:58:38 [name] ([id number]) -------------------------------------------------------------------------------- [place] Details Patient Name: Heather Dear A. Date of Service: 03/29/2018 Medical Record Number: [id number] Patient Account Number: [id number] Date of Birth/Sex: 1976/05/21 (42 y.o. 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

## 2018-04-05 ENCOUNTER — Encounter: Payer: Medicaid Other | Admitting: Nurse Practitioner

## 2018-04-05 DIAGNOSIS — E11621 Type 2 diabetes mellitus with foot ulcer: Secondary | ICD-10-CM | POA: Diagnosis not present

## 2018-04-08 NOTE — Progress Notes (Signed)
[name], [name] ([id number]) Visit Report for 04/05/2018 Arrival Information Details Patient Name: [name], [name] Date of Service: 04/05/2018 1:30 PM Medical Record Number: [id number] Patient Account Number: [id number] Date of Birth/Sex: May 20, 1976 (42 y.o. F) Treating RN: Secundino Ginger Primary Care Lissy Deuser: Suzan Garibaldi Other Clinician: Referring Estell Puccini: Suzan Garibaldi Treating Bobbi Yount/Extender: Cathie Olden in Treatment: 11 Visit Information History Since Last Visit Added or deleted any medications: No Patient Arrived: Wheel Chair Any new allergies or adverse reactions: No Arrival Time: 13:36 Had a fall or experienced change in No Accompanied By: family activities of daily living that may affect Transfer Assistance: None risk of falls: Patient Identification Verified: Yes Signs or symptoms of abuse/neglect since last visito No Secondary Verification Process Completed: Yes Hospitalized since last visit: No Patient Has Alerts: Yes Implantable device outside of the clinic excluding No Patient Alerts: DMII cellular tissue based products placed in the center since last visit: Has Dressing in Place as Prescribed: Yes Pain Present Now: No Electronic Signature(s) Signed: 04/05/2018 2:00:15 PM By: Secundino Ginger Entered By: Secundino Ginger on 04/05/2018 13:37:00 [name] ([id number]) -------------------------------------------------------------------------------- Encounter Discharge Information Details Patient Name: Heather Dear A. Date of Service: 04/05/2018 1:30 PM Medical Record Number: [id number] Patient Account Number: [id number] Date of Birth/Sex: [birth year] (42 y.o. F) Treating RN: Roger Shelter Primary Care Lynna Zamorano: Suzan Garibaldi Other Clinician: Referring Jammie Troup: Suzan Garibaldi Treating Forest Pruden/Extender: Cathie Olden in Treatment: 11 Encounter Discharge Information Items Discharge Condition: Stable Ambulatory Status: Wheelchair Discharge  Destination: Home Transportation: Private Auto Schedule Follow-up Appointment: Yes Clinical Summary of Care: Electronic Signature(s) Signed: 04/05/2018 2:24:38 PM By: Roger Shelter Entered By: Roger Shelter on 04/05/2018 14:16:58 [name], [name] ([id number]) -------------------------------------------------------------------------------- Lower Extremity Assessment Details Patient Name: Heather Dear A. Date of Service: 04/05/2018 1:30 PM Medical Record Number: [id number] Patient Account Number: [id number] Date of Birth/Sex: 08-19-1976 (42 y.o. F) Treating RN: Secundino Ginger Primary Care Lorance Pickeral: Suzan Garibaldi Other Clinician: Referring Tetsuo Coppola: Suzan Garibaldi Treating Yanilen Adamik/Extender: Cathie Olden in Treatment: 11 Edema Assessment Assessed: [Left: No] [Right: No] [Left: Edema] [Right: :] Calf Left: Right: Point of Measurement: 31 cm From Medial Instep 33.5 cm cm Ankle Left: Right: Point of Measurement: 12 cm From Medial Instep 20 cm cm Vascular Assessment Claudication: Claudication Assessment [Left:None] Pulses: Dorsalis Pedis Palpable: [Left:Yes] Posterior Tibial Extremity colors, hair growth, and conditions: Extremity Color: [Left:Normal] Hair Growth on Extremity: [Left:No] Temperature of Extremity: [Left:Cool] Capillary Refill: [Left:< 3 seconds] Toe Nail Assessment Left: Right: Thick: No Discolored: No Deformed: No Improper Length and Hygiene: No Electronic Signature(s) Signed: 04/05/2018 2:00:15 PM By: Secundino Ginger Entered By: Secundino Ginger on 04/05/2018 13:55:44 [name], [name] ([id number]) -------------------------------------------------------------------------------- Multi Wound Chart Details Patient Name: Heather Dear A. Date of Service: 04/05/2018 1:30 PM Medical Record Number: [id number] Patient Account Number: [id number] Date of Birth/Sex: 09-07-1976 (42 y.o. F) Treating RN: Ahmed Prima Primary Care Romie Keeble: Suzan Garibaldi Other  Clinician: Referring [name]: Suzan Garibaldi Treating Lasonja Lakins/Extender: Cathie Olden in Treatment: 11 Vital Signs Height(in): 98 Pulse(bpm): 75 Weight(lbs): 250 Blood Pressure(mmHg): 89/54 Body Mass Index(BMI): 37 Temperature(F): 97.7 Respiratory Rate 16 (breaths/min): Photos: [N/A:N/A] Wound Location: Left Toe Great N/A N/A Wounding Event: Gradually Appeared N/A N/A Primary Etiology: Diabetic Wound/Ulcer of the N/A N/A Lower Extremity Comorbid History: Chronic Obstructive N/A N/A Pulmonary Disease (COPD), Congestive Heart Failure, Hypertension, Type II Diabetes, Neuropathy, Seizure Disorder Date Acquired: 01/01/2018 N/A N/A Weeks of Treatment: 11 N/A N/A Wound Status: Open N/A N/A Pending Amputation on Yes N/A N/A Presentation: Measurements L x  W x D 0.4x0.2x0.4 N/A N/A (cm) Area (cm) : 0.063 N/A N/A Volume (cm) : 0.025 N/A N/A % Reduction in Area: 99.20% N/A N/A % Reduction in Volume: 99.20% N/A N/A Classification: Grade 2 N/A N/A Exudate Amount: Small N/A N/A Exudate Type: Serosanguineous N/A N/A Exudate Color: red, brown N/A N/A Wound Margin: Flat and Intact N/A N/A Granulation Amount: None Present (0%) N/A N/A Necrotic Amount: Small (1-33%) N/A N/A Exposed Structures: N/A N/A [name], [name] Fat Layer (Subcutaneous Tissue) Exposed: Yes Fascia: No Tendon: No Muscle: No Joint: No Bone: No Epithelialization: None N/A N/A Debridement: Debridement - Excisional N/A N/A Pre-procedure 13:58 N/A N/A Verification/Time Out Taken: Pain Control: Lidocaine 4% Topical Solution N/A N/A Tissue Debrided: Callus, Subcutaneous, Slough N/A N/A Level: Skin/Subcutaneous Tissue N/A N/A Debridement Area (sq cm): 0.08 N/A N/A Instrument: Curette N/A N/A Bleeding: Minimum N/A N/A Hemostasis Achieved: Pressure N/A N/A Procedural Pain: 0 N/A N/A Post Procedural Pain: 0 N/A N/A Debridement Treatment Procedure was tolerated well N/A  N/A Response: Post Debridement 0.5x0.3x0.4 N/A N/A Measurements L x W x D (cm) Post Debridement Volume: 0.047 N/A N/A (cm) Periwound Skin Texture: Callus: Yes N/A N/A Excoriation: No Induration: No Crepitus: No Rash: No Scarring: No Periwound Skin Moisture: Maceration: Yes N/A N/A Dry/Scaly: No Periwound Skin Color: Erythema: Yes N/A N/A Atrophie Blanche: No Cyanosis: No Ecchymosis: No Hemosiderin Staining: No Mottled: No Pallor: No Rubor: No Erythema Location: Circumferential N/A N/A Temperature: No Abnormality N/A N/A Tenderness on Palpation: No N/A N/A Wound Preparation: Ulcer Cleansing: N/A N/A Rinsed/Irrigated with Saline Topical Anesthetic Applied: Other: lidocaine 4% Procedures Performed: Debridement N/A N/A Treatment Notes Electronic Signature(s) Signed: 04/05/2018 2:08:43 PM By: Elmer Sow, Chantele A. ([id number]) Entered By: Lawanda Cousins on 04/05/2018 14:08:42 [name] ([id number]) -------------------------------------------------------------------------------- [place] Details Patient Name: Heather Dear A. Date of Service: 04/05/2018 1:30 PM Medical Record Number: [id number] Patient Account Number: [id number] Date of Birth/Sex: Sep 20, 1976 (43 y.o. F) Treating RN: Ahmed Prima Primary Care Monte Zinni: Suzan Garibaldi Other Clinician: Referring Laural Eiland: Suzan Garibaldi Treating Shardai Star/Extender: Cathie Olden in Treatment: 11 Active Inactive ` Abuse / Safety / Falls / Self Care Management Nursing Diagnoses: Potential for falls Goals: Patient will not experience any injury related to falls Date Initiated: 01/18/2018 Target Resolution Date: 04/28/2018 Goal Status: Active Interventions: Assess fall risk on admission and as needed Assess: immobility, friction, shearing, incontinence upon admission and as needed Assess impairment of mobility on admission and as needed per policy Assess personal safety and  home safety (as indicated) on admission and as needed Assess self care needs on admission and as needed Notes: ` Nutrition Nursing Diagnoses: Imbalanced nutrition Impaired glucose control: actual or potential Potential for alteratiion in Nutrition/Potential for imbalanced nutrition Goals: Patient/caregiver agrees to and verbalizes understanding of need to use nutritional supplements and/or vitamins as prescribed Date Initiated: 01/18/2018 Target Resolution Date: 04/28/2018 Goal Status: Active Patient/caregiver will maintain therapeutic glucose control Date Initiated: 01/18/2018 Target Resolution Date: 04/28/2018 Goal Status: Active Interventions: Assess patient nutrition upon admission and as needed per policy Provide education on elevated blood sugars and impact on wound healing Notes: AUDIE, WIESER A. ([id number]) Orientation to the Wound Care Program Nursing Diagnoses: Knowledge deficit related to the wound healing center program Goals: Patient/caregiver will verbalize understanding of the [facility] Date Initiated: 01/18/2018 Target Resolution Date: 01/27/2018 Goal Status: Active Interventions: Provide education on orientation to the wound center Notes: ` Wound/Skin Impairment Nursing Diagnoses: Impaired tissue integrity Knowledge deficit  related to smoking impact on wound healing Knowledge deficit related to ulceration/compromised skin integrity Goals: Ulcer/skin breakdown will have a volume reduction of 80% by week 12 Date Initiated: 01/18/2018 Target Resolution Date: 04/21/2018 Goal Status: Active Interventions: Assess patient/caregiver ability to perform ulcer/skin care regimen upon admission and as needed Assess ulceration(s) every visit Notes: Electronic Signature(s) Signed: 04/05/2018 3:04:13 PM By: Alric Quan Entered By: Alric Quan on 04/05/2018 13:58:07 Fawaz, Fatin A.  ([id number]) -------------------------------------------------------------------------------- Pain Assessment Details Patient Name: Heather Dear A. Date of Service: 04/05/2018 1:30 PM Medical Record Number: [id number] Patient Account Number: [id number] Date of Birth/Sex: Apr 23, 1976 (42 y.o. F) Treating RN: Secundino Ginger Primary Care Khianna Blazina: Suzan Garibaldi Other Clinician: Referring Tabytha Gradillas: Suzan Garibaldi Treating Nate Perri/Extender: Cathie Olden in Treatment: 11 Active Problems Location of Pain Severity and Description of Pain Patient Has Paino No Site Locations Pain Management and Medication Current Pain Management: Goals for Pain Management Topical or injectable lidocaine is offered to patient for acute pain when surgical debridement is performed. If needed, Patient is instructed to use over the counter pain medication for the following 24-48 hours after debridement. Wound care MDs do not prescribed pain medications. Patient has chronic pain or uncontrolled pain. Patient has been instructed to make an appointment with their Primary Care Physician for pain management. Electronic Signature(s) Signed: 04/05/2018 2:00:15 PM By: Secundino Ginger Entered By: Secundino Ginger on 04/05/2018 13:37:25 [name] ([id number]) -------------------------------------------------------------------------------- Patient/Caregiver Education Details Patient Name: Heather Dear A. Date of Service: 04/05/2018 1:30 PM Medical Record Number: [id number] Patient Account Number: [id number] Date of Birth/Gender: 1976-02-28 (42 y.o. F) Treating RN: Roger Shelter Primary Care Physician: Suzan Garibaldi Other Clinician: Referring Physician: Suzan Garibaldi Treating Physician/Extender: Cathie Olden in Treatment: 11 Education Assessment Education Provided To: Patient Education Topics Provided Wound Debridement: Handouts: Wound Debridement Methods: Explain/Verbal Responses: State content  correctly Wound/Skin Impairment: Handouts: Caring for Your Ulcer Methods: Explain/Verbal Responses: State content correctly Electronic Signature(s) Signed: 04/05/2018 2:24:38 PM By: Roger Shelter Entered By: Roger Shelter on 04/05/2018 14:17:15 [name], Mehlani A. ([id number]) -------------------------------------------------------------------------------- Wound Assessment Details Patient Name: Heather Dear A. Date of Service: 04/05/2018 1:30 PM Medical Record Number: [id number] Patient Account Number: [id number] Date of Birth/Sex: 07-12-1976 (42 y.o. F) Treating RN: Secundino Ginger Primary Care Nazareth Kirk: Suzan Garibaldi Other Clinician: Referring Evelette Hollern: Suzan Garibaldi Treating Ethylene Reznick/Extender: Cathie Olden in Treatment: 11 Wound Status Wound Number: 1 Primary Diabetic Wound/Ulcer of the Lower Extremity Etiology: Wound Location: Left Toe Great Wound Open Wounding Event: Gradually Appeared Status: Date Acquired: 01/01/2018 Comorbid Chronic Obstructive Pulmonary Disease Weeks Of Treatment: 11 History: (COPD), Congestive Heart Failure, Clustered Wound: No Hypertension, Type II Diabetes, Neuropathy, Pending Amputation On Presentation Seizure Disorder Photos Photo Uploaded By: Secundino Ginger on 04/05/2018 13:59:54 Wound Measurements Length: (cm) 0.4 % Reduction Width: (cm) 0.2 % Reduction Depth: (cm) 0.4 Epitheliali Area: (cm) 0.063 Tunneling: Volume: (cm) 0.025 Underminin in Area: 99.2% in Volume: 99.2% zation: None No g: No Wound Description Classification: Grade 2 Foul Odor Wound Margin: Flat and Intact Slough/Fib Exudate Amount: Small Exudate Type: Serosanguineous Exudate Color: red, brown After Cleansing: No rino Yes Wound Bed Granulation Amount: None Present (0%) Exposed Structure Necrotic Amount: Small (1-33%) Fascia Exposed: No Necrotic Quality: Adherent Slough Fat Layer (Subcutaneous Tissue) Exposed: Yes Tendon Exposed: No Muscle Exposed: No Joint  Exposed: No Bone Exposed: No Norgaard, Ayah A. ([id number]) Periwound Skin Texture Texture Color No Abnormalities Noted: No No Abnormalities Noted: No Callus: Yes Atrophie Blanche: No Crepitus: No Cyanosis: No Excoriation: No Ecchymosis: No  Induration: No Erythema: Yes Rash: No Erythema Location: Circumferential Scarring: No Hemosiderin Staining: No Mottled: No Moisture Pallor: No No Abnormalities Noted: No Rubor: No Dry / Scaly: No Maceration: Yes Temperature / Pain Temperature: No Abnormality Wound Preparation Ulcer Cleansing: Rinsed/Irrigated with Saline Topical Anesthetic Applied: Other: lidocaine 4%, Electronic Signature(s) Signed: 04/05/2018 2:00:15 PM By: Secundino Ginger Entered By: Secundino Ginger on 04/05/2018 13:53:13 [name] ([id number]) -------------------------------------------------------------------------------- [place] Details Patient Name: Heather Dear A. Date of Service: 04/05/2018 1:30 PM Medical Record Number: [id number] Patient Account Number: [id number] Date of Birth/Sex: 1976/07/18 (42 y.o. F) Treating RN: Secundino Ginger Primary Care Jonna Dittrich: Suzan Garibaldi Other Clinician: Referring Jun Rightmyer: Suzan Garibaldi Treating Shelbi Vaccaro/Extender: Cathie Olden in Treatment: 11 Vital Signs Time Taken: 13:38 Temperature (F): 97.7 Height (in): 69 Pulse (bpm): 75 Weight (lbs): 250 Respiratory Rate (breaths/min): 16 Body Mass Index (BMI): 36.9 Blood Pressure (mmHg): 89/54 Reference Range: 80 - 120 mg / dl Notes pt BP 89/54 she stated that sometimes it runs low. no apparent distress NP made aware. Electronic Signature(s) Signed: 04/05/2018 2:00:15 PM By: Secundino Ginger Entered By: Secundino Ginger on 04/05/2018 13:43:23

## 2018-04-08 NOTE — Progress Notes (Signed)
[name], [name] ([id number]) Visit Report for 04/05/2018 Chief Complaint Document Details Patient Name: [name], [name] Date of Service: 04/05/2018 1:30 PM Medical Record Number: [id number] Patient Account Number: [id number] Date of Birth/Sex: [birth year] (42 y.o. F) Treating RN: Ahmed Prima Primary Care Provider: Suzan Garibaldi Other Clinician: Referring Provider: Suzan Garibaldi Treating Provider/Extender: Cathie Olden in Treatment: 11 Information Obtained from: Patient Chief Complaint She is here for evaluation of a left great toe ulcer Electronic Signature(s) Signed: 04/05/2018 2:09:12 PM By: Lawanda Cousins Entered By: Lawanda Cousins on 04/05/2018 [place], [place] ([id number]) -------------------------------------------------------------------------------- Debridement Details Patient Name: [name] A. Date of Service: 04/05/2018 1:30 PM Medical Record Number: [id number] Patient Account Number: [id number] Date of Birth/Sex: Jun 10, 1976 (42 y.o. F) Treating RN: Ahmed Prima Primary Care Provider: Suzan Garibaldi Other Clinician: Referring Provider: Suzan Garibaldi Treating Provider/Extender: Cathie Olden in Treatment: 11 Debridement Performed for Wound #1 Left Toe Great Assessment: Performed By: Physician Lawanda Cousins, NP Debridement Type: Debridement Severity of Tissue Pre Fat layer exposed Debridement: Pre-procedure Verification/Time Yes - 13:58 Out Taken: Start Time: 13:58 Pain Control: Lidocaine 4% Topical Solution Total Area Debrided (L x W): 0.4 (cm) x 0.2 (cm) = 0.08 (cm) Tissue and other material Viable, Non-Viable, Callus, Slough, Subcutaneous, Fibrin/Exudate, Slough debrided: Level: Skin/Subcutaneous Tissue Debridement Description: Excisional Instrument: Curette Bleeding: Minimum Hemostasis Achieved: Pressure End Time: 14:01 Procedural Pain: 0 Post Procedural Pain: 0 Response to Treatment: Procedure was tolerated well Level of  Consciousness: Awake and Alert Post Procedure Vitals: Temperature: 97.7 Pulse: 75 Respiratory Rate: 16 Blood Pressure: Systolic Blood Pressure: 89 Diastolic Blood Pressure: 54 Post Debridement Measurements of Total Wound Length: (cm) 0.5 Width: (cm) 0.3 Depth: (cm) 0.4 Volume: (cm) 0.047 Character of Wound/Ulcer Post Debridement: Requires Further Debridement Severity of Tissue Post Debridement: Fat layer exposed Post Procedure Diagnosis Same as Pre-procedure Electronic Signature(s) Signed: 04/05/2018 2:08:59 PM By: Lawanda Cousins Signed: 04/05/2018 3:04:13 PM By: Belia Heman, [name] ([id number]) Entered By: Lawanda Cousins on 04/05/2018 14:08:59 [name] ([id number]) -------------------------------------------------------------------------------- HPI Details Patient Name: [name] A. Date of Service: 04/05/2018 1:30 PM Medical Record Number: [id number] Patient Account Number: [id number] Date of Birth/Sex: October 22, 1976 (42 y.o. F) Treating RN: Ahmed Prima Primary Care Provider: Suzan Garibaldi Other Clinician: Referring Provider: Suzan Garibaldi Treating Provider/Extender: Cathie Olden in Treatment: 11 History of Present Illness HPI Description: 01/18/18-She is here for initial evaluation of the left great toe ulcer. She is a poor historian in regards to timeframe in detail. She states approximately 4 weeks ago she lacerated her toe on something in the house. She followed up with her primary care who placed her on Bactrim and ultimately a second dose of Bactrim prior to coming to wound clinic. She states she has been treating the toe with peroxide, Betadine and a Band-Aid. She did not check her blood sugar this morning but checked it yesterday morning it was 327; she is unaware of a recent A1c and there are no current records. She saw Dr. she would've orthopedics last week for an old injury to the left ankle, she states he did not see her toe, nor did  she bring it to his attention. She smokes approximately 1 pack cigarettes a day. Her social situation is concerning, she arrives this morning with her mother who appears extremely intoxicated/under the influence; her mother was asked to leave the room and be monitored by the patient's grandmother. The patient's aunt then accompanied the patient and the room throughout the rest of the  appointment. We had a lengthy discussion regarding the deleterious effects of uncontrolled hyperglycemia and smoking as it relates to wound healing and overall health. She was strongly encouraged to decrease her smoking and get her diabetes under better control. She states she is currently on a diet and has cut down her [facility] consumption. The left toe is erythematous, macerated and slightly edematous with malodor present. The edema in her left foot is below her baseline, there is no erythema streaking. We will treat her with Santyl, doxycycline; we have ordered and xray, culture and provided a Peg assist surgical shoe and cultured the wound. 01/25/18-She is here in follow-up evaluation for a left great toe ulcer and presents with an abscess to her suprapubic area. She states her blood sugars remain elevated, feeling "sick" and if levels are below 250, but she is trying. She has made no attempt to decrease her smoking stating that we "can't take away her food in her cigarettes". She has been compliant with offloading using the PEG assist you. She is using Santyl daily. the culture obtained last week grew staph aureus and Enterococcus faecalis; continues on the doxycycline and Augmentin was added on Monday. The suprapubic area has erythema, no femoral variation, purple discoloration, minimal induration, was accessed with a cotton tip applicator with sanguinopurulent drainage, this was cultured, I suspect the current antibiotic treatment will cover and we will not add anything to her current treatment plan. She was  advised to go to urgent care or ER with any change in redness, induration or fever. 02/01/18-She is here in follow-up evaluation for left great toe ulcers and a new abdominal abscess from last week. She was able to use packing until earlier this week, where she "forgot it was there". She states she was feeling ill with GI symptoms last week and was not taking her antibiotic. She states her glucose levels have been predominantly less than 200, with occasional levels between 200-250. She thinks this was contributing to her GI symptoms as they have resolved without intervention. There continues to be significant laceration to left toe, otherwise it clinically looks stable/improved. There is now less superficial opening to the lateral aspect of the great toe that was residual blister. We will transition to [facility] to all wounds, she will continue her Augmentin. If there is no change or deterioration next week for reculture. 02/08/18-She is here in follow-up evaluation for left great toe ulcer and abdominal ulcer. There is an improvement in both wounds. She has been wrapping her left toe with coban, not by our direction, which has created an area of discoloration to the medial aspect; she has been advised to NOT use coban secondary to her neuropathy. She states her glucose levels have been high over this last week ranging from 200-350, she continues to smoke. She admits to being less compliant with her offloading shoe. We will continue with same treatment plan and she will follow-up next week. 02/15/18-She is here in follow-up evaluation for left great toe ulcer and abdominal ulcer. The abdominal ulcer is epithelialized. The left great toe ulcer is improved and all injury from last week using the Coban wrap is resolved, the lateral ulcer is healed. She admits to noncompliance with wearing offloading shoe and admits to glucose levels being greater than 300 most of the week. She continues to smoke and  expresses no desire to quit. There is one area medially that probes deeper than it has historically, erythema to the toe and dorsal foot has  consistently waxed and waned. There is no overt signs of cellulitis or infection but we will culture the wound for any occult infection given the new area of depth and erythema. We will hold off on sensitivities for initiation of antibiotic therapy. 02/22/18-She is here in follow up evaluation for left great toe ulcer. There is overall significant improvement in both wound appearance, erythema and edema with changes made last week. She was not initiated on antibiotic therapy. Culture obtained last week showed oxacillin sensitive staph aureus, sensitive to clindamycin. Clindamycin has been called into the pharmacy but she has been instructed to hold off on initiation secondary to overall clinical improvement and her history of antibiotic intolerance. She has been instructed to contact the clinic with any noted changes/deterioration and the wound, erythema, Dunkley, Lalana A. ([id number]) edema and/or pain. She will follow-up next week. She continues to smoke and her glucose levels remain elevated >250; she admits to compliance with offloading shoe 03/01/18 on evaluation today patient appears to be doing fairly well in regard to her left first toe ulcer. She has been tolerating the dressing changes with the [facility] Dressing without complication and overall this has definitely showed signs of improvement according to records as well is what the patient tells me today. I'm very pleased in that regard. She is having no pain today 03/08/18 She is here for follow up evaluation of a left great toe ulcer. She remains non-compliant with glucose control and smoking cessation; glucose levels consistently >200. She states that she got new shoe inserts/peg assist. She admits to compliance with offloading. Since my last evaluation there is significant improvement. We will  switch to prisma at this time and she will follow up next week. She is noted to be tachycardic at this appointment, heart rate 120s; she has a history of heart rate 70-130 according to our records. She admits to extreme agitation r/t personal issues; she was advised to monitor her heartrate and contact her physician if it does not return to a more normal range (<100). She takes cardizem twice daily. 03/15/18-She is here in follow-up evaluation for left great toe ulcer. She remains noncompliant with glucose control and smoking cessation. She admits to compliance with wearing offloading shoe. The ulcer is improved/stable and we will continue with the same treatment plan and she will follow-up next week 03/22/18-She is here for evaluation for left great toe ulcer. There continues to be significant improvement despite recurrent hyperglycemia (over 500 yesterday) and she continues to smoke. She has been compliant with offloading and we will continue with same treatment plan and she will follow-up next week. 03/29/18-She is here for evaluation for left great toe ulcer. Despite continuing to smoke and uncontrolled diabetes she continues to improve. She is compliant with offloading shoe. We will continue with the same treatment plan and she will follow-up next week 04/05/18- She is here in follow up evaluation for a left great toe ulcer; she presents with small pustule to left fifth toe (resembles ant bite). She admits to compliance with wearing offloading shoe; continues to smoke or have uncontrolled blood glucose control. There is more callus than usual with evidence of bleeding; she denies known trauma. Electronic Signature(s) Signed: 04/05/2018 2:18:55 PM By: Lawanda Cousins Previous Signature: 04/05/2018 2:11:18 PM Version By: Lawanda Cousins Entered By: Lawanda Cousins on 04/05/2018 14:18:55 [name]  ([id number]) -------------------------------------------------------------------------------- Physician Orders Details Patient Name: [name] A. Date of Service: 04/05/2018 1:30 PM Medical Record Number: [id number] Patient Account Number: [id number]  Date of Birth/Sex: February 05, 1976 (41 y.o. F) Treating RN: Ahmed Prima Primary Care Provider: Suzan Garibaldi Other Clinician: Referring Provider: Suzan Garibaldi Treating Provider/Extender: Cathie Olden in Treatment: 11 Verbal / Phone Orders: Yes Clinician: Carolyne Fiscal, Debi Read Back and Verified: Yes Diagnosis Coding Wound Cleansing Wound #1 Left Toe Great o Clean wound with Normal Saline. o Cleanse wound with mild soap and water o May Shower, gently pat wound dry prior to applying new dressing. Anesthetic (add to Medication List) Wound #1 Left Toe Great o Topical Lidocaine 4% cream applied to wound bed prior to debridement (In Clinic Only). Primary Wound Dressing Wound #1 Left Toe Great o Other: - endoform Secondary Dressing Wound #1 Left Toe Great o Dry Gauze o Conform/Kerlix Dressing Change Frequency Wound #1 Left Toe Great o Change dressing every other day. o Other: - as needed Follow-up Appointments Wound #1 Left Toe Great o Return Appointment in 1 week. Edema Control Wound #1 Left Toe Great o Elevate legs to the level of the heart and pump ankles as often as possible o Other: - Ace bandage Off-Loading Wound #1 Left Toe Great o Open toe surgical shoe with peg assist. Additional Orders / Instructions Wound #1 Left Toe Great o Stop Smoking o Increase protein intake. QUATISHA, ZYLKA ([id number]) o Other: - use Betadine on the left 5th toe Patient Medications Allergies: erythromycin base Notifications Medication Indication Start End lidocaine DOSE 1 - topical 4 % cream - 1 cream topical Electronic Signature(s) Signed: 04/05/2018 3:04:13 PM By: Alric Quan Signed:  04/05/2018 3:35:38 PM By: Lawanda Cousins Entered By: Alric Quan on 04/05/2018 14:03:38 KALICIA, DUFRESNE A. ([id number]) -------------------------------------------------------------------------------- Prescription 04/05/2018 Patient Name: [name] A. Provider: Lawanda Cousins NP Date of Birth: 12/27/1975 NPI#: [id number] Sex: F DEA#: [id number] Phone #: [phone] License #: Patient Address: [facility] [employer], [place] [address] [address], [place], [place] [address] [phone] Allergies erythromycin base Medication Medication: Route: Strength: Form: lidocaine topical 4% cream Class: TOPICAL LOCAL ANESTHETICS Dose: Frequency / Time: Indication: 1 1 cream topical Number of Refills: Number of Units: 0 Generic Substitution: Start Date: End Date: Administered at [place]: Yes Time Administered: Time Discontinued: Note to Pharmacy: Signature(s): Date(s): Electronic Signature(s) Signed: 04/05/2018 3:04:13 PM By: Alric Quan Signed: 04/05/2018 3:35:38 PM By: Lawanda Cousins Entered By: Alric Quan on 04/05/2018 14:03:40 [name] ([id number]) [name], Samentha A. ([id number]) --------------------------------------------------------------------------------  Problem List Details Patient Name: [name] A. Date of Service: 04/05/2018 1:30 PM Medical Record Number: [id number] Patient Account Number: [id number] Date of Birth/Sex: 1976-07-04 (42 y.o. 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 ([id number]) -------------------------------------------------------------------------------- Progress Note Details Patient Name: [name] A. Date of Service: 04/05/2018 1:30 PM Medical Record Number: [id number] Patient Account Number: [id number] Date of Birth/Sex: 1975/11/17 (42 y.o. F) Treating RN:  Carolyne Fiscal Debi Primary Care Provider: Suzan Garibaldi Other Clinician: Referring Provider: Suzan Garibaldi Treating Provider/Extender: Cathie Olden in Treatment: 11 Subjective Chief Complaint Information obtained from Patient She is here for evaluation of a left great toe ulcer History of Present Illness (HPI) 01/18/18-She is here for initial evaluation of the left great toe ulcer. She is a poor historian in regards to timeframe in detail. She states approximately 4 weeks ago she lacerated her toe on something in the house. She followed up with her primary care who placed her on Bactrim and ultimately a second dose of Bactrim prior to coming to wound clinic. She states she has been treating the toe with peroxide, Betadine and a Band-Aid. She did not check her blood sugar this morning but checked it yesterday morning it was 327; she is unaware of a recent A1c and there are no current records. She saw Dr. she would've orthopedics last week for an old injury to the left ankle, she states he did not see her toe, nor did she bring it to his attention. She smokes approximately 1 pack cigarettes a day. Her social situation is concerning, she  arrives this morning with her mother who appears extremely intoxicated/under the influence; her mother was asked to leave the room and be monitored by the patient's grandmother. The patient's aunt then accompanied the patient and the room throughout the rest of the appointment. We had a lengthy discussion regarding the deleterious effects of uncontrolled hyperglycemia and smoking as it relates to wound healing and overall health. She was strongly encouraged to decrease her smoking and get her diabetes under better control. She states she is currently on a diet and has cut down her [facility] consumption. The left toe is erythematous, macerated and slightly edematous with malodor present. The edema in her left foot is below her baseline, there is no erythema streaking. We will treat her with Santyl, doxycycline; we have ordered and xray, culture and provided a Peg assist surgical shoe and cultured the wound. 01/25/18-She is here in follow-up evaluation for a left great toe ulcer and presents with an abscess to her suprapubic area. She states her blood sugars remain elevated, feeling "sick" and if levels are below 250, but she is trying. She has made no attempt to decrease her smoking stating that we "can't take away her food in her cigarettes". She has been compliant with offloading using the PEG assist you. She is using Santyl daily. the culture obtained last week grew staph aureus and Enterococcus faecalis; continues on the doxycycline and Augmentin was added on Monday. The suprapubic area has erythema, no femoral variation, purple discoloration, minimal induration, was accessed with a cotton tip applicator with sanguinopurulent drainage, this was cultured, I suspect the current antibiotic treatment will cover and we will not add anything to her current treatment plan. She was advised to go to urgent care or ER with any change in redness, induration or fever. 02/01/18-She is here in follow-up  evaluation for left great toe ulcers and a new abdominal abscess from last week. She was able to use packing until earlier this week, where she "forgot it was there". She states she was feeling ill with GI symptoms last week and was not taking her antibiotic. She states her glucose levels have been predominantly less than 200, with occasional levels between 200-250. She thinks this was contributing to her GI symptoms as they have resolved without intervention. There continues to be significant laceration to left toe, otherwise it  clinically looks stable/improved. There is now less superficial opening to the lateral aspect of the great toe that was residual blister. We will transition to [facility] to all wounds, she will continue her Augmentin. If there is no change or deterioration next week for reculture. 02/08/18-She is here in follow-up evaluation for left great toe ulcer and abdominal ulcer. There is an improvement in both wounds. She has been wrapping her left toe with coban, not by our direction, which has created an area of discoloration to the medial aspect; she has been advised to NOT use coban secondary to her neuropathy. She states her glucose levels have been high over this last week ranging from 200-350, she continues to smoke. She admits to being less compliant with her offloading shoe. We will continue with same treatment plan and she will follow-up next week. 02/15/18-She is here in follow-up evaluation for left great toe ulcer and abdominal ulcer. The abdominal ulcer is epithelialized. The left great toe ulcer is improved and all injury from last week using the Coban wrap is resolved, the lateral ulcer is healed. She admits to noncompliance with wearing offloading shoe and admits to glucose levels being greater than 300 most of the week. She continues to smoke and expresses no desire to quit. There is one area medially that probes deeper than it has historically, erythema to the  toe and dorsal foot has consistently waxed and waned. There is no overt signs of cellulitis or infection but we will culture the wound for any occult infection given the new area of depth and erythema. We will hold off on [name], Leeya A. ([id number]) sensitivities for initiation of antibiotic therapy. 02/22/18-She is here in follow up evaluation for left great toe ulcer. There is overall significant improvement in both wound appearance, erythema and edema with changes made last week. She was not initiated on antibiotic therapy. Culture obtained last week showed oxacillin sensitive staph aureus, sensitive to clindamycin. Clindamycin has been called into the pharmacy but she has been instructed to hold off on initiation secondary to overall clinical improvement and her history of antibiotic intolerance. She has been instructed to contact the clinic with any noted changes/deterioration and the wound, erythema, edema and/or pain. She will follow-up next week. She continues to smoke and her glucose levels remain elevated >250; she admits to compliance with offloading shoe 03/01/18 on evaluation today patient appears to be doing fairly well in regard to her left first toe ulcer. She has been tolerating the dressing changes with the [facility] Dressing without complication and overall this has definitely showed signs of improvement according to records as well is what the patient tells me today. I'm very pleased in that regard. She is having no pain today 03/08/18 She is here for follow up evaluation of a left great toe ulcer. She remains non-compliant with glucose control and smoking cessation; glucose levels consistently >200. She states that she got new shoe inserts/peg assist. She admits to compliance with offloading. Since my last evaluation there is significant improvement. We will switch to prisma at this time and she will follow up next week. She is noted to be tachycardic at this appointment,  heart rate 120s; she has a history of heart rate 70-130 according to our records. She admits to extreme agitation r/t personal issues; she was advised to monitor her heartrate and contact her physician if it does not return to a more normal range (<100). She takes cardizem twice daily. 03/15/18-She is here in follow-up  evaluation for left great toe ulcer. She remains noncompliant with glucose control and smoking cessation. She admits to compliance with wearing offloading shoe. The ulcer is improved/stable and we will continue with the same treatment plan and she will follow-up next week 03/22/18-She is here for evaluation for left great toe ulcer. There continues to be significant improvement despite recurrent hyperglycemia (over 500 yesterday) and she continues to smoke. She has been compliant with offloading and we will continue with same treatment plan and she will follow-up next week. 03/29/18-She is here for evaluation for left great toe ulcer. Despite continuing to smoke and uncontrolled diabetes she continues to improve. She is compliant with offloading shoe. We will continue with the same treatment plan and she will follow-up next week 04/05/18- She is here in follow up evaluation for a left great toe ulcer; she presents with small pustule to left fifth toe (resembles ant bite). She admits to compliance with wearing offloading shoe; continues to smoke or have uncontrolled blood glucose control. There is more callus than usual with evidence of bleeding; she denies known trauma. Wound History Patient reportedly has not tested positive for osteomyelitis. Patient reportedly has not had testing performed to evaluate circulation in the legs. Patient History Information obtained from Patient. Family History Cancer - Paternal Grandparents, Heart Disease - Paternal Grandparents, Hypertension - Paternal Grandparents, No family history of Diabetes, Hereditary Spherocytosis, Kidney Disease, Lung  Disease, Seizures, Stroke, Thyroid Problems, Tuberculosis. Social History Current every day smoker, Marital Status - Single, Alcohol Use - Never, Drug Use - Prior History, Caffeine Use - Daily. Objective Vanhandel, Latese A. ([id number]) Constitutional Vitals Time Taken: 1:38 PM, Height: 69 in, Weight: 250 lbs, BMI: 36.9, Temperature: 97.7 F, Pulse: 75 bpm, Respiratory Rate: 16 breaths/min, Blood Pressure: 89/54 mmHg. General Notes: pt BP 89/54 she stated that sometimes it runs low. no apparent distress NP made aware. Integumentary (Hair, Skin) Wound #1 status is Open. Original cause of wound was Gradually Appeared. The wound is located on the Left Toe Great. The wound measures 0.4cm length x 0.2cm width x 0.4cm depth; 0.063cm^2 area and 0.025cm^3 volume. There is Fat Layer (Subcutaneous Tissue) Exposed exposed. There is no tunneling or undermining noted. There is a small amount of serosanguineous drainage noted. The wound margin is flat and intact. There is no granulation within the wound bed. There is a small (1-33%) amount of necrotic tissue within the wound bed including Adherent Slough. The periwound skin appearance exhibited: Callus, Maceration, Erythema. The periwound skin appearance did not exhibit: Crepitus, Excoriation, Induration, Rash, Scarring, Dry/Scaly, Atrophie Blanche, Cyanosis, Ecchymosis, Hemosiderin Staining, Mottled, Pallor, Rubor. The surrounding wound skin color is noted with erythema which is circumferential. Periwound temperature was noted as No Abnormality. 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 There was a Excisional Skin/Subcutaneous Tissue Debridement with  a total area of 0.08 sq cm performed by Lawanda Cousins, NP. With the following instrument(s): Curette to remove Viable and Non-Viable tissue/material. Material removed includes Callus, Subcutaneous Tissue, Slough, and Fibrin/Exudate after achieving pain control using Lidocaine 4% Topical Solution. No specimens were taken. A time out was conducted at 13:58, prior to the start of the procedure. A Minimum amount of bleeding was controlled with Pressure. The procedure was  tolerated well with a pain level of 0 throughout and a pain level of 0 following the procedure. Patient s Level of Consciousness post procedure was recorded as Awake and Alert. Post Debridement Measurements: 0.5cm length x 0.3cm width x 0.4cm depth; 0.047cm^3 volume. Character of Wound/Ulcer Post Debridement requires further debridement. Severity of Tissue Post Debridement is: Fat layer exposed. Post procedure Diagnosis Wound #1: Same as Pre-Procedure Plan Wound Cleansing: Coppock, Vaida A. ([id number]) Wound #1 Left Toe Great: Clean wound with Normal Saline. Cleanse wound with mild soap and water May Shower, gently pat wound dry prior to applying new dressing. Anesthetic (add to Medication List): Wound #1 Left Toe Great: Topical Lidocaine 4% cream applied to wound bed prior to debridement (In Clinic Only). Primary Wound Dressing: Wound #1 Left Toe Great: Other: - endoform Secondary Dressing: Wound #1 Left Toe Great: Dry Gauze Conform/Kerlix Dressing Change Frequency: Wound #1 Left Toe Great: Change dressing every other day. Other: - as needed Follow-up Appointments: Wound #1 Left Toe Great: Return Appointment in 1 week. Edema Control: Wound #1 Left Toe Great: Elevate legs to the level of the heart and pump ankles as often as possible Other: - Ace bandage Off-Loading: Wound #1 Left Toe Great: Open toe surgical shoe with peg assist. Additional Orders / Instructions: Wound #1 Left Toe Great: Stop Smoking Increase  protein intake. Other: - use Betadine on the left 5th toe The following medication(s) was prescribed: lidocaine topical 4 % cream 1 1 cream topical was prescribed at facility Electronic Signature(s) Signed: 04/05/2018 2:20:13 PM By: Lawanda Cousins Entered By: Lawanda Cousins on 04/05/2018 14:20:12 [name] ([id number]) -------------------------------------------------------------------------------- ROS/PFSH Details Patient Name: [name] A. Date of Service: 04/05/2018 1:30 PM Medical Record Number: [id number] Patient Account Number: [id number] Date of Birth/Sex: 07-03-1976 (42 y.o. F) Treating RN: Ahmed Prima Primary Care Provider: Suzan Garibaldi Other Clinician: Referring Provider: Suzan Garibaldi Treating Provider/Extender: Cathie Olden in Treatment: 11 Information Obtained From Patient Wound History Do you currently have one or more open woundso No Have you tested positive for osteomyelitis (bone infection)o No Have you had any tests for circulation on your legso No Hematologic/Lymphatic Medical History: Negative for: Anemia; Hemophilia; Human Immunodeficiency Virus; Lymphedema; Sickle Cell Disease Respiratory Medical History: Positive for: Chronic Obstructive Pulmonary Disease (COPD) Negative for: Aspiration; Asthma; Pneumothorax; Sleep Apnea; Tuberculosis Cardiovascular Medical History: Positive for: Congestive Heart Failure; Hypertension Negative for: Angina; Arrhythmia; Coronary Artery Disease; Deep Vein Thrombosis; Hypotension; Myocardial Infarction; Peripheral Arterial Disease; Peripheral Venous Disease; Phlebitis; Vasculitis Gastrointestinal Medical History: Negative for: Cirrhosis ; Colitis; Crohnos; Hepatitis A; Hepatitis B; Hepatitis C Endocrine Medical History: Positive for: Type II Diabetes Treated with: Insulin, Oral agents Genitourinary Medical History: Negative for: End Stage Renal Disease Immunological Medical History: Negative for:  Lupus Erythematosus; Raynaudos; Scleroderma Musculoskeletal Jasko, Maie A. ([id number]) Medical History: Negative for: Gout; Rheumatoid Arthritis; Osteoarthritis; Osteomyelitis Neurologic Medical History: Positive for: Neuropathy; Seizure Disorder Oncologic Medical History: Negative for: Received Chemotherapy; Received Radiation Immunizations Pneumococcal Vaccine: Received Pneumococcal Vaccination: Yes Immunization Notes: up to date Implantable Devices Family and Social History Cancer: Yes - Paternal Grandparents; Diabetes: No; Heart Disease: Yes - Paternal Grandparents; Hereditary Spherocytosis: No; Hypertension: Yes - Paternal Grandparents; Kidney Disease: No; Lung Disease: No; Seizures: No; Stroke: No; Thyroid Problems: No; Tuberculosis: No; Current every day smoker; Marital Status - Single; Alcohol Use: Never; Drug Use: Prior History; Caffeine Use: Daily; Financial Concerns: No; Food, Clothing or Shelter Needs: No; Support System Lacking: No; Transportation Concerns: No; Advanced Directives: No; Patient does  not want information on Advanced Directives Physician Affirmation I have reviewed and agree with the above information. Electronic Signature(s) Signed: 04/05/2018 3:04:13 PM By: Alric Quan Signed: 04/05/2018 3:35:38 PM By: Lawanda Cousins Entered By: Lawanda Cousins on 04/05/2018 14:19:51 [name] ([id number]) -------------------------------------------------------------------------------- SuperBill Details Patient Name: [name] A. Date of Service: 04/05/2018 Medical Record Number: [id number] Patient Account Number: [id number] Date of Birth/Sex: [birth year] (42 y.o. 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

## 2018-04-12 ENCOUNTER — Encounter: Payer: Medicaid Other | Admitting: Nurse Practitioner

## 2018-04-12 DIAGNOSIS — E11621 Type 2 diabetes mellitus with foot ulcer: Secondary | ICD-10-CM | POA: Diagnosis not present

## 2018-04-14 NOTE — Progress Notes (Signed)
[name] ([id number]) Visit Report for 04/12/2018 Chief Complaint Document Details Patient Name: [name], [name] Date of Service: 04/12/2018 11:15 AM Medical Record Number: [id number] Patient Account Number: [id number] Date of Birth/Sex: 1976/06/20 (42 y.o. F) Treating RN: Ahmed Prima Primary Care Provider: Suzan Garibaldi Other Clinician: Referring Provider: Suzan Garibaldi Treating Provider/Extender: Cathie Olden in Treatment: 12 Information Obtained from: Patient Chief Complaint She is here for evaluation of a left great toe ulcer Electronic Signature(s) Signed: 04/12/2018 12:29:24 PM By: Lawanda Cousins Entered By: Lawanda Cousins on 04/12/2018 12:29:24 [name] ([id number]) -------------------------------------------------------------------------------- Debridement Details Patient Name: Heather Dear A. Date of Service: 04/12/2018 11:15 AM Medical Record Number: [id number] Patient Account Number: [id number] Date of Birth/Sex: [birth year] (42 y.o. F) Treating RN: Ahmed Prima Primary Care Provider: Suzan Garibaldi Other Clinician: Referring Provider: Suzan Garibaldi Treating Provider/Extender: Cathie Olden in Treatment: 12 Debridement Performed for Wound #1 Left Toe Great Assessment: Performed By: Physician Lawanda Cousins, NP Debridement Type: Debridement Severity of Tissue Pre Limited to breakdown of skin Debridement: Pre-procedure Verification/Time Yes - 11:45 Out Taken: Start Time: 11:45 Pain Control: Lidocaine 4% Topical Solution Total Area Debrided (L x W): 0.3 (cm) x 0.1 (cm) = 0.03 (cm) Tissue and other material Viable, Callus, Slough, Skin: Dermis , Fibrin/Exudate, Slough debrided: Level: Skin/Dermis Debridement Description: Selective/Open Wound Instrument: Curette Bleeding: Minimum Hemostasis Achieved: Pressure End Time: 11:48 Procedural Pain: 0 Post Procedural Pain: 0 Response to Treatment: Procedure was tolerated well Level of  Consciousness: Awake and Alert Post Procedure Vitals: Temperature: 98.5 Pulse: 109 Respiratory Rate: 18 Blood Pressure: Systolic Blood Pressure: 97 Diastolic Blood Pressure: 45 Post Debridement Measurements of Total Wound Length: (cm) 0.3 Width: (cm) 0.3 Depth: (cm) 0.2 Volume: (cm) 0.014 Character of Wound/Ulcer Post Debridement: Requires Further Debridement Severity of Tissue Post Debridement: Limited to breakdown of skin Post Procedure Diagnosis Same as Pre-procedure Electronic Signature(s) Signed: 04/12/2018 12:28:35 PM By: Lawanda Cousins Signed: 04/12/2018 4:29:22 PM By: Belia Heman, [name] ([id number]) Entered By: Lawanda Cousins on 04/12/2018 12:28:34 [name] ([id number]) -------------------------------------------------------------------------------- HPI Details Patient Name: Heather Dear A. Date of Service: 04/12/2018 11:15 AM Medical Record Number: [id number] Patient Account Number: [id number] Date of Birth/Sex: 1976/03/23 (42 y.o. F) Treating RN: Ahmed Prima Primary Care Provider: Suzan Garibaldi Other Clinician: Referring Provider: Suzan Garibaldi Treating Provider/Extender: Cathie Olden in Treatment: 12 History of Present Illness HPI Description: 01/18/18-She is here for initial evaluation of the left great toe ulcer. She is a poor historian in regards to timeframe in detail. She states approximately 4 weeks ago she lacerated her toe on something in the house. She followed up with her primary care who placed her on Bactrim and ultimately a second dose of Bactrim prior to coming to wound clinic. She states she has been treating the toe with peroxide, Betadine and a Band-Aid. She did not check her blood sugar this morning but checked it yesterday morning it was 327; she is unaware of a recent A1c and there are no current records. She saw Dr. she would've orthopedics last week for an old injury to the left ankle, she states he did not see her  toe, nor did she bring it to his attention. She smokes approximately 1 pack cigarettes a day. Her social situation is concerning, she arrives this morning with her mother who appears extremely intoxicated/under the influence; her mother was asked to leave the room and be monitored by the patient's grandmother. The patient's aunt then accompanied the patient and the room  throughout the rest of the appointment. We had a lengthy discussion regarding the deleterious effects of uncontrolled hyperglycemia and smoking as it relates to wound healing and overall health. She was strongly encouraged to decrease her smoking and get her diabetes under better control. She states she is currently on a diet and has cut down her [facility] consumption. The left toe is erythematous, macerated and slightly edematous with malodor present. The edema in her left foot is below her baseline, there is no erythema streaking. We will treat her with Santyl, doxycycline; we have ordered and xray, culture and provided a Peg assist surgical shoe and cultured the wound. 01/25/18-She is here in follow-up evaluation for a left great toe ulcer and presents with an abscess to her suprapubic area. She states her blood sugars remain elevated, feeling "sick" and if levels are below 250, but she is trying. She has made no attempt to decrease her smoking stating that we "can't take away her food in her cigarettes". She has been compliant with offloading using the PEG assist you. She is using Santyl daily. the culture obtained last week grew staph aureus and Enterococcus faecalis; continues on the doxycycline and Augmentin was added on Monday. The suprapubic area has erythema, no femoral variation, purple discoloration, minimal induration, was accessed with a cotton tip applicator with sanguinopurulent drainage, this was cultured, I suspect the current antibiotic treatment will cover and we will not add anything to her current treatment  plan. She was advised to go to urgent care or ER with any change in redness, induration or fever. 02/01/18-She is here in follow-up evaluation for left great toe ulcers and a new abdominal abscess from last week. She was able to use packing until earlier this week, where she "forgot it was there". She states she was feeling ill with GI symptoms last week and was not taking her antibiotic. She states her glucose levels have been predominantly less than 200, with occasional levels between 200-250. She thinks this was contributing to her GI symptoms as they have resolved without intervention. There continues to be significant laceration to left toe, otherwise it clinically looks stable/improved. There is now less superficial opening to the lateral aspect of the great toe that was residual blister. We will transition to [facility] to all wounds, she will continue her Augmentin. If there is no change or deterioration next week for reculture. 02/08/18-She is here in follow-up evaluation for left great toe ulcer and abdominal ulcer. There is an improvement in both wounds. She has been wrapping her left toe with coban, not by our direction, which has created an area of discoloration to the medial aspect; she has been advised to NOT use coban secondary to her neuropathy. She states her glucose levels have been high over this last week ranging from 200-350, she continues to smoke. She admits to being less compliant with her offloading shoe. We will continue with same treatment plan and she will follow-up next week. 02/15/18-She is here in follow-up evaluation for left great toe ulcer and abdominal ulcer. The abdominal ulcer is epithelialized. The left great toe ulcer is improved and all injury from last week using the Coban wrap is resolved, the lateral ulcer is healed. She admits to noncompliance with wearing offloading shoe and admits to glucose levels being greater than 300 most of the week. She continues  to smoke and expresses no desire to quit. There is one area medially that probes deeper than it has historically, erythema to the  toe and dorsal foot has consistently waxed and waned. There is no overt signs of cellulitis or infection but we will culture the wound for any occult infection given the new area of depth and erythema. We will hold off on sensitivities for initiation of antibiotic therapy. 02/22/18-She is here in follow up evaluation for left great toe ulcer. There is overall significant improvement in both wound appearance, erythema and edema with changes made last week. She was not initiated on antibiotic therapy. Culture obtained last week showed oxacillin sensitive staph aureus, sensitive to clindamycin. Clindamycin has been called into the pharmacy but she has been instructed to hold off on initiation secondary to overall clinical improvement and her history of antibiotic intolerance. She has been instructed to contact the clinic with any noted changes/deterioration and the wound, erythema, Osoria, Jennise A. ([id number]) edema and/or pain. She will follow-up next week. She continues to smoke and her glucose levels remain elevated >250; she admits to compliance with offloading shoe 03/01/18 on evaluation today patient appears to be doing fairly well in regard to her left first toe ulcer. She has been tolerating the dressing changes with the [facility] Dressing without complication and overall this has definitely showed signs of improvement according to records as well is what the patient tells me today. I'm very pleased in that regard. She is having no pain today 03/08/18 She is here for follow up evaluation of a left great toe ulcer. She remains non-compliant with glucose control and smoking cessation; glucose levels consistently >200. She states that she got new shoe inserts/peg assist. She admits to compliance with offloading. Since my last evaluation there is significant  improvement. We will switch to prisma at this time and she will follow up next week. She is noted to be tachycardic at this appointment, heart rate 120s; she has a history of heart rate 70-130 according to our records. She admits to extreme agitation r/t personal issues; she was advised to monitor her heartrate and contact her physician if it does not return to a more normal range (<100). She takes cardizem twice daily. 03/15/18-She is here in follow-up evaluation for left great toe ulcer. She remains noncompliant with glucose control and smoking cessation. She admits to compliance with wearing offloading shoe. The ulcer is improved/stable and we will continue with the same treatment plan and she will follow-up next week 03/22/18-She is here for evaluation for left great toe ulcer. There continues to be significant improvement despite recurrent hyperglycemia (over 500 yesterday) and she continues to smoke. She has been compliant with offloading and we will continue with same treatment plan and she will follow-up next week. 03/29/18-She is here for evaluation for left great toe ulcer. Despite continuing to smoke and uncontrolled diabetes she continues to improve. She is compliant with offloading shoe. We will continue with the same treatment plan and she will follow-up next week 04/05/18- She is here in follow up evaluation for a left great toe ulcer; she presents with small pustule to left fifth toe (resembles ant bite). She admits to compliance with wearing offloading shoe; continues to smoke or have uncontrolled blood glucose control. There is more callus than usual with evidence of bleeding; she denies known trauma. 04/12/18-She is here for preparation for left great toe ulcer. Despite noncompliance with glycemic control and smoking she continues to make improvement. She continues to wear offloading shoe. The pustule, that was identified last week, to the left fifth toe is resolved. She will follow-up  in 2  weeks Electronic Signature(s) Signed: 04/12/2018 12:30:26 PM By: Lawanda Cousins Entered By: Lawanda Cousins on 04/12/2018 12:30:26 [name] ([id number]) -------------------------------------------------------------------------------- Physician Orders Details Patient Name: Heather Dear A. Date of Service: 04/12/2018 11:15 AM Medical Record Number: [id number] Patient Account Number: [id number] Date of Birth/Sex: 18-May-1976 (42 y.o. F) Treating RN: Ahmed Prima Primary Care Provider: Suzan Garibaldi Other Clinician: Referring Provider: Suzan Garibaldi Treating Provider/Extender: Cathie Olden in Treatment: 12 Verbal / Phone Orders: Yes Clinician: Carolyne Fiscal, Debi Read Back and Verified: Yes Diagnosis Coding Wound Cleansing Wound #1 Left Toe Great o Clean wound with Normal Saline. o Cleanse wound with mild soap and water o May Shower, gently pat wound dry prior to applying new dressing. Anesthetic (add to Medication List) Wound #1 Left Toe Great o Topical Lidocaine 4% cream applied to wound bed prior to debridement (In Clinic Only). Primary Wound Dressing Wound #1 Left Toe Great o Other: - endoform Secondary Dressing Wound #1 Left Toe Great o Dry Gauze o Conform/Kerlix Dressing Change Frequency Wound #1 Left Toe Great o Change dressing every other day. o Other: - as needed Follow-up Appointments Wound #1 Left Toe Great o Return Appointment in 2 weeks. Edema Control Wound #1 Left Toe Great o Elevate legs to the level of the heart and pump ankles as often as possible Off-Loading Wound #1 Left Toe Great o Open toe surgical shoe with peg assist. Additional Orders / Instructions Wound #1 Left Toe Great o Stop Smoking o Increase protein intake. o Other: - use Betadine on the left 5th toe [name], [name] ([id number]) Patient Medications Allergies: erythromycin base Notifications Medication Indication Start End lidocaine DOSE 1  - topical 4 % cream - 1 cream topical Electronic Signature(s) Signed: 04/12/2018 4:29:22 PM By: Alric Quan Signed: 04/12/2018 5:32:34 PM By: Lawanda Cousins Entered By: Alric Quan on 04/12/2018 11:49:26 [name]) -------------------------------------------------------------------------------- Prescription 04/12/2018 Patient Name: Heather Dear A. Provider: Lawanda Cousins NP Date of Birth: [birth year] NPI#: [id number] Sex: F DEA#: [id number] Phone #: [phone] License #: Patient Address: [facility] [place], [place] [address] [address], [place], [place] [address] [phone] Allergies erythromycin base Medication Medication: Route: Strength: Form: lidocaine topical 4% cream Class: TOPICAL LOCAL ANESTHETICS Dose: Frequency / Time: Indication: 1 1 cream topical Number of Refills: Number of Units: 0 Generic Substitution: Start Date: End Date: Administered at [place]: Yes Time Administered: Time Discontinued: Note to Pharmacy: Signature(s): Date(s): Electronic Signature(s) Signed: 04/12/2018 4:29:22 PM By: Alric Quan Signed: 04/12/2018 5:32:34 PM By: Lawanda Cousins Entered By: Alric Quan on 04/12/2018 11:49:27 [name] ([id number]) [name], [name] ([id number]) --------------------------------------------------------------------------------  Problem List Details Patient Name: Heather Dear A. Date of Service: 04/12/2018 11:15 AM Medical Record Number: [id number] Patient Account Number: [id number] Date of Birth/Sex: 1976/08/18 (42 y.o. 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 Inactive Problems ICD-10 Code Description Active Date Inactive Date L03.032 Cellulitis of left toe 01/25/2018 01/18/2018 Resolved Problems ICD-10 Code Description Active Date Resolved Date L02.211 Cutaneous abscess of abdominal wall 01/25/2018 02/22/2018 Electronic Signature(s) Signed: 04/12/2018 12:29:04 PM By: Lawanda Cousins Previous Signature: 04/12/2018  92:11:94 PM Version By: Lawanda Cousins Entered By: Lawanda Cousins on 04/12/2018 12:29:04 [name] ([id number]) -------------------------------------------------------------------------------- Progress Note Details Patient Name: Heather Dear A. Date of Service: 04/12/2018 11:15 AM Medical Record Number: [id number] Patient Account Number: [id number] Date of Birth/Sex: 1976/08/03 (42 y.o. F) Treating RN: Ahmed Prima Primary Care Provider: Suzan Garibaldi Other Clinician: Referring Provider: Suzan Garibaldi Treating Provider/Extender: Cathie Olden in Treatment: 12 Subjective Chief Complaint Information obtained from Patient She is here for evaluation of a left great toe ulcer History of Present Illness (HPI) 01/18/18-She is here for initial evaluation of the left great toe ulcer. She is a poor historian in regards to timeframe in detail. She states approximately 4 weeks ago she lacerated her toe on something in the house. She followed up with her primary care who placed her on Bactrim and ultimately a second dose of Bactrim prior to coming to wound clinic. She states she has been treating the toe with peroxide, Betadine and a Band-Aid. She did not check her blood sugar this morning but checked it yesterday morning it was 327; she is unaware of a recent A1c and there are no current records. She saw Dr. she would've orthopedics last week for an old  injury to the left ankle, she states he did not see her toe, nor did she bring it to his attention. She smokes approximately 1 pack cigarettes a day. Her social situation is concerning, she arrives this morning with her mother who appears extremely intoxicated/under the influence; her mother was asked to leave the room and be monitored by the patient's grandmother. The patient's aunt then accompanied the patient and the room throughout the rest of the appointment. We had a lengthy discussion regarding the deleterious effects of uncontrolled hyperglycemia and smoking as it relates to wound healing and overall health. She was strongly encouraged to decrease her smoking and get her diabetes under better control. She states she is currently on a diet and has cut down her [facility] consumption. The left toe is erythematous, macerated and slightly edematous with malodor present. The edema in her left foot is below her baseline, there is no erythema streaking. We will treat her with Santyl, doxycycline; we have ordered and xray, culture and provided a Peg assist surgical shoe and cultured the wound. 01/25/18-She is here in follow-up evaluation for a left great toe ulcer and presents with an abscess to her suprapubic area. She states her blood sugars remain elevated, feeling "sick" and if levels are below 250, but she is trying. She has made no attempt to decrease her smoking stating that we "can't take away her food in her cigarettes". She has been compliant with offloading using the PEG assist you. She is using Santyl daily. the culture obtained last week grew staph aureus and Enterococcus faecalis; continues on the doxycycline and Augmentin was added on Monday. The suprapubic area has erythema, no femoral variation, purple discoloration, minimal induration, was accessed with a cotton tip applicator with sanguinopurulent drainage, this was cultured, I suspect the current antibiotic treatment will cover  and we will not add anything to her current treatment plan. She was advised to go to urgent care or ER with any change in redness, induration or fever. 02/01/18-She is here in follow-up evaluation for left great toe ulcers and a new abdominal abscess from last week. She was able to use packing until earlier this week, where she "forgot it was there". She states she was feeling ill with GI symptoms last  week and was not taking her antibiotic. She states her glucose levels have been predominantly less than 200, with occasional levels between 200-250. She thinks this was contributing to her GI symptoms as they have resolved without intervention. There continues to be significant laceration to left toe, otherwise it clinically looks stable/improved. There is now less superficial opening to the lateral aspect of the great toe that was residual blister. We will transition to [facility] to all wounds, she will continue her Augmentin. If there is no change or deterioration next week for reculture. 02/08/18-She is here in follow-up evaluation for left great toe ulcer and abdominal ulcer. There is an improvement in both wounds. She has been wrapping her left toe with coban, not by our direction, which has created an area of discoloration to the medial aspect; she has been advised to NOT use coban secondary to her neuropathy. She states her glucose levels have been high over this last week ranging from 200-350, she continues to smoke. She admits to being less compliant with her offloading shoe. We will continue with same treatment plan and she will follow-up next week. 02/15/18-She is here in follow-up evaluation for left great toe ulcer and abdominal ulcer. The abdominal ulcer is epithelialized. The left great toe ulcer is improved and all injury from last week using the Coban wrap is resolved, the lateral ulcer is healed. She admits to noncompliance with wearing offloading shoe and admits to glucose levels  being greater than 300 most of the week. She continues to smoke and expresses no desire to quit. There is one area medially that probes deeper than it has historically, erythema to the toe and dorsal foot has consistently waxed and waned. There is no overt signs of cellulitis or infection but we will culture the wound for any occult infection given the new area of depth and erythema. We will hold off on [name], [name] ([id number]) sensitivities for initiation of antibiotic therapy. 02/22/18-She is here in follow up evaluation for left great toe ulcer. There is overall significant improvement in both wound appearance, erythema and edema with changes made last week. She was not initiated on antibiotic therapy. Culture obtained last week showed oxacillin sensitive staph aureus, sensitive to clindamycin. Clindamycin has been called into the pharmacy but she has been instructed to hold off on initiation secondary to overall clinical improvement and her history of antibiotic intolerance. She has been instructed to contact the clinic with any noted changes/deterioration and the wound, erythema, edema and/or pain. She will follow-up next week. She continues to smoke and her glucose levels remain elevated >250; she admits to compliance with offloading shoe 03/01/18 on evaluation today patient appears to be doing fairly well in regard to her left first toe ulcer. She has been tolerating the dressing changes with the [facility] Dressing without complication and overall this has definitely showed signs of improvement according to records as well is what the patient tells me today. I'm very pleased in that regard. She is having no pain today 03/08/18 She is here for follow up evaluation of a left great toe ulcer. She remains non-compliant with glucose control and smoking cessation; glucose levels consistently >200. She states that she got new shoe inserts/peg assist. She admits to compliance with offloading.  Since my last evaluation there is significant improvement. We will switch to prisma at this time and she will follow up next week. She is noted to be tachycardic at this appointment, heart rate 120s; she has a  history of heart rate 70-130 according to our records. She admits to extreme agitation r/t personal issues; she was advised to monitor her heartrate and contact her physician if it does not return to a more normal range (<100). She takes cardizem twice daily. 03/15/18-She is here in follow-up evaluation for left great toe ulcer. She remains noncompliant with glucose control and smoking cessation. She admits to compliance with wearing offloading shoe. The ulcer is improved/stable and we will continue with the same treatment plan and she will follow-up next week 03/22/18-She is here for evaluation for left great toe ulcer. There continues to be significant improvement despite recurrent hyperglycemia (over 500 yesterday) and she continues to smoke. She has been compliant with offloading and we will continue with same treatment plan and she will follow-up next week. 03/29/18-She is here for evaluation for left great toe ulcer. Despite continuing to smoke and uncontrolled diabetes she continues to improve. She is compliant with offloading shoe. We will continue with the same treatment plan and she will follow-up next week 04/05/18- She is here in follow up evaluation for a left great toe ulcer; she presents with small pustule to left fifth toe (resembles ant bite). She admits to compliance with wearing offloading shoe; continues to smoke or have uncontrolled blood glucose control. There is more callus than usual with evidence of bleeding; she denies known trauma. 04/12/18-She is here for preparation for left great toe ulcer. Despite noncompliance with glycemic control and smoking she continues to make improvement. She continues to wear offloading shoe. The pustule, that was identified last week, to the  left fifth toe is resolved. She will follow-up in 2 weeks Patient History Information obtained from Patient. Family History Cancer - Paternal Grandparents, Heart Disease - Paternal Grandparents, Hypertension - Paternal Grandparents, No family history of Diabetes, Hereditary Spherocytosis, Kidney Disease, Lung Disease, Seizures, Stroke, Thyroid Problems, Tuberculosis. Social History Current every day smoker, Marital Status - Single, Alcohol Use - Never, Drug Use - Prior History, Caffeine Use - Daily. Review of Systems (ROS) Constitutional Symptoms (General Health) Denies complaints or symptoms of Fatigue, Fever, Chills. [name], [name] ([id number]) Objective Constitutional Vitals Time Taken: 11:26 AM, Height: 69 in, Weight: 250 lbs, BMI: 36.9, Temperature: 98.5 F, Pulse: 109 bpm, Respiratory Rate: 18 breaths/min, Blood Pressure: 97/45 mmHg. Integumentary (Hair, Skin) Wound #1 status is Open. Original cause of wound was Gradually Appeared. The wound is located on the Left Toe Great. The wound measures 0.3cm length x 0.1cm width x 0.2cm depth; 0.024cm^2 area and 0.005cm^3 volume. There is Fat Layer (Subcutaneous Tissue) Exposed exposed. There is no tunneling or undermining noted. There is a small amount of serosanguineous drainage noted. The wound margin is flat and intact. There is large (67-100%) pink granulation within the wound bed. There is a small (1-33%) amount of necrotic tissue within the wound bed including Adherent Slough. The periwound skin appearance exhibited: Callus, Maceration, Erythema. The periwound skin appearance did not exhibit: Crepitus, Excoriation, Induration, Rash, Scarring, Dry/Scaly, Atrophie Blanche, Cyanosis, Ecchymosis, Hemosiderin Staining, Mottled, Pallor, Rubor. The surrounding wound skin color is noted with erythema which is circumferential. Periwound temperature was noted as No Abnormality. Assessment Active Problems ICD-10 Type 2 diabetes mellitus  with foot ulcer Non-pressure chronic ulcer of other part of left foot limited to breakdown of skin Procedures Wound #1 Pre-procedure diagnosis of Wound #1 is a Diabetic Wound/Ulcer of the Lower Extremity located on the Left Toe Great .Severity of Tissue Pre Debridement is: Limited to breakdown of skin. There  was a Selective/Open Wound Skin/Dermis Debridement with a total area of 0.03 sq cm performed by Lawanda Cousins, NP. With the following instrument(s): Curette to remove Viable tissue/material. Material removed includes Callus, Slough, Skin: Dermis, and Fibrin/Exudate after achieving pain control using Lidocaine 4% Topical Solution. No specimens were taken. A time out was conducted at 11:45, prior to the start of the procedure. A Minimum amount of bleeding was controlled with Pressure. The procedure was tolerated well with a pain level of 0 throughout and a pain level of 0 following the procedure. Patient s Level of Consciousness post procedure was recorded as Awake and Alert. Post Debridement Measurements: 0.3cm length x 0.3cm width x 0.2cm depth; 0.014cm^3 volume. Character of Wound/Ulcer Post Debridement requires further debridement. Severity of Tissue Post Debridement is: Limited to breakdown of skin. Post procedure Diagnosis Wound #1: Same as Pre-Procedure Plan [name], [name] ([id number]) Wound Cleansing: Wound #1 Left Toe Great: Clean wound with Normal Saline. Cleanse wound with mild soap and water May Shower, gently pat wound dry prior to applying new dressing. Anesthetic (add to Medication List): Wound #1 Left Toe Great: Topical Lidocaine 4% cream applied to wound bed prior to debridement (In Clinic Only). Primary Wound Dressing: Wound #1 Left Toe Great: Other: - endoform Secondary Dressing: Wound #1 Left Toe Great: Dry Gauze Conform/Kerlix Dressing Change Frequency: Wound #1 Left Toe Great: Change dressing every other day. Other: - as needed Follow-up  Appointments: Wound #1 Left Toe Great: Return Appointment in 2 weeks. Edema Control: Wound #1 Left Toe Great: Elevate legs to the level of the heart and pump ankles as often as possible Off-Loading: Wound #1 Left Toe Great: Open toe surgical shoe with peg assist. Additional Orders / Instructions: Wound #1 Left Toe Great: Stop Smoking Increase protein intake. Other: - use Betadine on the left 5th toe The following medication(s) was prescribed: lidocaine topical 4 % cream 1 1 cream topical was prescribed at facility Electronic Signature(s) Signed: 04/12/2018 12:30:55 PM By: Lawanda Cousins Entered By: Lawanda Cousins on 04/12/2018 12:30:55 [name] ([id number]) -------------------------------------------------------------------------------- ROS/PFSH Details Patient Name: Heather Dear A. Date of Service: 04/12/2018 11:15 AM Medical Record Number: [id number] Patient Account Number: [id number] Date of Birth/Sex: 1976-03-16 (42 y.o. F) Treating RN: Ahmed Prima Primary Care Provider: Suzan Garibaldi Other Clinician: Referring Provider: Suzan Garibaldi Treating Provider/Extender: Cathie Olden in Treatment: 12 Information Obtained From Patient Wound History Do you currently have one or more open woundso No Have you tested positive for osteomyelitis (bone infection)o No Have you had any tests for circulation on your legso No Constitutional Symptoms (General Health) Complaints and Symptoms: Negative for: Fatigue; Fever; Chills Hematologic/Lymphatic Medical History: Negative for: Anemia; Hemophilia; Human Immunodeficiency Virus; Lymphedema; Sickle Cell Disease Respiratory Medical History: Positive for: Chronic Obstructive Pulmonary Disease (COPD) Negative for: Aspiration; Asthma; Pneumothorax; Sleep Apnea; Tuberculosis Cardiovascular Medical History: Positive for: Congestive Heart Failure; Hypertension Negative for: Angina; Arrhythmia; Coronary Artery Disease; Deep Vein  Thrombosis; Hypotension; Myocardial Infarction; Peripheral Arterial Disease; Peripheral Venous Disease; Phlebitis; Vasculitis Gastrointestinal Medical History: Negative for: Cirrhosis ; Colitis; Crohnos; Hepatitis A; Hepatitis B; Hepatitis C Endocrine Medical History: Positive for: Type II Diabetes Treated with: Insulin, Oral agents Genitourinary Medical History: Negative for: End Stage Renal Disease Immunological [name], [name] ([id number]) Medical History: Negative for: Lupus Erythematosus; Raynaudos; Scleroderma Musculoskeletal Medical History: Negative for: Gout; Rheumatoid Arthritis; Osteoarthritis; Osteomyelitis Neurologic Medical History: Positive for: Neuropathy; Seizure Disorder Oncologic Medical History: Negative for: Received Chemotherapy; Received Radiation Immunizations Pneumococcal Vaccine: Received Pneumococcal Vaccination: Yes  Immunization Notes: up to date Implantable Devices Family and Social History Cancer: Yes - Paternal Grandparents; Diabetes: No; Heart Disease: Yes - Paternal Grandparents; Hereditary Spherocytosis: No; Hypertension: Yes - Paternal Grandparents; Kidney Disease: No; Lung Disease: No; Seizures: No; Stroke: No; Thyroid Problems: No; Tuberculosis: No; Current every day smoker; Marital Status - Single; Alcohol Use: Never; Drug Use: Prior History; Caffeine Use: Daily; Financial Concerns: No; Food, Clothing or Shelter Needs: No; Support System Lacking: No; Transportation Concerns: No; Advanced Directives: No; Patient does not want information on Advanced Directives Physician Affirmation I have reviewed and agree with the above information. Electronic Signature(s) Signed: 04/12/2018 4:29:22 PM By: Alric Quan Signed: 04/12/2018 5:32:34 PM By: Lawanda Cousins Entered By: Lawanda Cousins on 04/12/2018 12:30:44 [name] ([id number]) -------------------------------------------------------------------------------- SuperBill  Details Patient Name: Heather Dear A. Date of Service: 04/12/2018 Medical Record Number: [id number] Patient Account Number: [id number] Date of Birth/Sex: [birth year] (42 y.o. F) Treating RN: Ahmed Prima Primary Care Provider: Suzan Garibaldi Other Clinician: Referring Provider: Suzan Garibaldi Treating Provider/Extender: Cathie Olden in Treatment: 12 Diagnosis Coding ICD-10 Codes Code Description E11.621 Type 2 diabetes mellitus with foot ulcer L97.521 Non-pressure chronic ulcer of other part of left foot limited to breakdown of skin Facility Procedures CPT4 Code Description: [id number] - DEBRIDE WOUND 1ST 20 SQ CM OR < ICD-10 Diagnosis Description L97.521 Non-pressure chronic ulcer of other part of left foot limited to Modifier: breakdown of s Quantity: 1 kin Physician Procedures CPT4 Code Description: [id number] - WC PHYS DEBR WO ANESTH 20 SQ CM ICD-10 Diagnosis Description L97.521 Non-pressure chronic ulcer of other part of left foot limited to Modifier: breakdown of sk Quantity: 1 in Electronic Signature(s) Signed: 04/12/2018 12:31:04 PM By: Lawanda Cousins Entered By: Lawanda Cousins on 04/12/2018 12:31:04

## 2018-04-14 NOTE — Progress Notes (Signed)
[name], [name] ([id number]) Visit Report for 04/12/2018 Arrival Information Details Patient Name: ORPHA, DAIN A. Date of Service: 04/12/2018 11:15 AM Medical Record Number: [id number] Patient Account Number: [id number] Date of Birth/Sex: 07-May-1976 (42 y.o. F) Treating RN: [name] Primary Care Rector Devons[place]: [name] Other Clinician: Referring Khayree Delellis: [name] Treating [name]/Extender: [name] in Treatment: 12 Visit Information History Since Last Visit Added or deleted any medications: No Patient Arrived: Wheel Chair Any new allergies or adverse reactions: No Arrival Time: 11:26 Had a fall or experienced change in No Accompanied By: mother activities of daily living that may affect Transfer Assistance: None risk of falls: Patient Identification Verified: Yes Signs or symptoms of abuse/neglect since last visito No Secondary Verification Process Completed: Yes Hospitalized since last visit: No Patient Has Alerts: Yes Implantable device outside of the clinic excluding No Patient Alerts: DMII cellular tissue based products placed in the center since last visit: Has Dressing in Place as Prescribed: Yes Pain Present Now: No Electronic Signature(s) Signed: 04/12/2018 4:12:59 PM By: [name] Entered By: [name] on 04/12/2018 11:26:44 [name] ([id number]) -------------------------------------------------------------------------------- Lower Extremity Assessment Details Patient Name: Heather Dear A. Date of Service: 04/12/2018 11:15 AM Medical Record Number: [id number] Patient Account Number: [id number] Date of Birth/Sex: [birth year] (42 y.o. F) Treating RN: [name] Primary Care Tanyika Barros: [name] Other Clinician: Referring Maizee Reinhold: [name] Treating Mekaela Azizi/Extender: [name] in Treatment: 12 Vascular Assessment Pulses: Dorsalis Pedis Palpable: [Left:Yes] Posterior Tibial Extremity colors, hair  growth, and conditions: Extremity Color: [Left:Normal] Hair Growth on Extremity: [Left:Yes] Temperature of Extremity: [Left:Warm] Capillary Refill: [Left:< 3 seconds] Toe Nail Assessment Left: Right: Thick: Yes Discolored: Yes Deformed: No Improper Length and Hygiene: No Electronic Signature(s) Signed: 04/12/2018 4:12:59 PM By: [name] Entered By: [name] on 04/12/2018 11:32:52 [name], [name] ([id number]) -------------------------------------------------------------------------------- Multi Wound Chart Details Patient Name: Heather Dear A. Date of Service: 04/12/2018 11:15 AM Medical Record Number: [id number] Patient Account Number: [id number] Date of Birth/Sex: 1976-06-18 (42 y.o. F) Treating RN: [name] Primary Care Fruma [place]: [name] Other Clinician: Referring Pernie Grosso: [name] Treating [name]/Extender: [name] in Treatment: 12 Vital Signs Height(in): 69 Pulse(bpm): 109 Weight(lbs): 250 Blood Pressure(mmHg): 97/45 Body Mass Index(BMI): 37 Temperature(F): 98.5 Respiratory Rate 18 (breaths/min): Photos: [N/A:N/A] Wound Location: Left Toe Great N/A N/A Wounding Event: Gradually Appeared N/A N/A Primary Etiology: Diabetic Wound/Ulcer of the N/A N/A Lower Extremity Comorbid History: Chronic Obstructive N/A N/A Pulmonary Disease (COPD), Congestive Heart Failure, Hypertension, Type II Diabetes, Neuropathy, Seizure Disorder Date Acquired: 01/01/2018 N/A N/A Weeks of Treatment: 12 N/A N/A Wound Status: Open N/A N/A Pending Amputation on Yes N/A N/A Presentation: Measurements L x W x D 0.3x0.1x0.2 N/A N/A (cm) Area (cm) : 0.024 N/A N/A Volume (cm) : 0.005 N/A N/A % Reduction in Area: 99.70% N/A N/A % Reduction in Volume: 99.80% N/A N/A Classification: Grade 2 N/A N/A Exudate Amount: Small N/A N/A Exudate Type: Serosanguineous N/A N/A Exudate Color: red, brown N/A N/A Wound Margin: Flat and Intact N/A  N/A Granulation Amount: Large (67-100%) N/A N/A Granulation Quality: Pink N/A N/A Necrotic Amount: Small (1-33%) N/A N/A [name], [name] ([id number]) Exposed Structures: Fat Layer (Subcutaneous N/A N/A Tissue) Exposed: Yes Fascia: No Tendon: No Muscle: No Joint: No Bone: No Epithelialization: None N/A N/A Debridement: Debridement - Selective/Open N/A N/A Wound Pre-procedure 11:45 N/A N/A Verification/Time Out Taken: Pain Control: Lidocaine 4% Topical Solution N/A N/A Tissue Debrided: Callus, Slough N/A N/A Level: Skin/Dermis N/A N/A Debridement Area (sq  cm): 0.03 N/A N/A Instrument: Curette N/A N/A Bleeding: Minimum N/A N/A Hemostasis Achieved: Pressure N/A N/A Procedural Pain: 0 N/A N/A Post Procedural Pain: 0 N/A N/A Debridement Treatment Procedure was tolerated well N/A N/A Response: Post Debridement 0.3x0.3x0.2 N/A N/A Measurements L x W x D (cm) Post Debridement Volume: 0.014 N/A N/A (cm) Periwound Skin Texture: Callus: Yes N/A N/A Excoriation: No Induration: No Crepitus: No Rash: No Scarring: No Periwound Skin Moisture: Maceration: Yes N/A N/A Dry/Scaly: No Periwound Skin Color: Erythema: Yes N/A N/A Atrophie Blanche: No Cyanosis: No Ecchymosis: No Hemosiderin Staining: No Mottled: No Pallor: No Rubor: No Erythema Location: Circumferential N/A N/A Temperature: No Abnormality N/A N/A Tenderness on Palpation: No N/A N/A Wound Preparation: Ulcer Cleansing: N/A N/A Rinsed/Irrigated with Saline Topical Anesthetic Applied: Other: lidocaine 4% Procedures Performed: Debridement N/A N/A Treatment Notes Electronic Signature(s) [name], [name] ([id number]) Signed: 04/12/2018 12:29:12 PM By: [name] Previous Signature: 04/12/2018 12:28:09 PM Version By: [name] Entered By: [name] on 04/12/2018 12:29:11 [name] ([id number]) -------------------------------------------------------------------------------- [place] Details Patient Name: Heather Dear A. Date of Service: 04/12/2018 11:15 AM Medical Record Number: [id number] Patient Account Number: [id number] Date of Birth/Sex: 06/10/1976 (42 y.o. F) Treating RN: [name] Primary Care Marquinn Meschke: [name] Other Clinician: Referring Tobey Schmelzle: [name] Treating Modesta Sammons/Extender: [name] in Treatment: 12 Active Inactive ` Abuse / Safety / Falls / Self Care Management Nursing Diagnoses: Potential for falls Goals: Patient will not experience any injury related to falls Date Initiated: 01/18/2018 Target Resolution Date: 04/28/2018 Goal Status: Active Interventions: Assess fall risk on admission and as needed Assess: immobility, friction, shearing, incontinence upon admission and as needed Assess impairment of mobility on admission and as needed per policy Assess personal safety and home safety (as indicated) on admission and as needed Assess self care needs on admission and as needed Notes: ` Nutrition Nursing Diagnoses: Imbalanced nutrition Impaired glucose control: actual or potential Potential for alteratiion in Nutrition/Potential for imbalanced nutrition Goals: Patient/caregiver agrees to and verbalizes understanding of need to use nutritional supplements and/or vitamins as prescribed Date Initiated: 01/18/2018 Target Resolution Date: 04/28/2018 Goal Status: Active Patient/caregiver will maintain therapeutic glucose control Date Initiated: 01/18/2018 Target Resolution Date: 04/28/2018 Goal Status: Active Interventions: Assess patient nutrition upon admission and as needed per policy Provide education on elevated blood sugars and impact on wound healing Notes: [name], [name] ([id number]) Orientation to the Wound Care Program Nursing Diagnoses: Knowledge deficit related to the wound healing center program Goals: Patient/caregiver will verbalize understanding of the [place] Date  Initiated: 01/18/2018 Target Resolution Date: 01/27/2018 Goal Status: Active Interventions: Provide education on orientation to the wound center Notes: ` Wound/Skin Impairment Nursing Diagnoses: Impaired tissue integrity Knowledge deficit related to smoking impact on wound healing Knowledge deficit related to ulceration/compromised skin integrity Goals: Ulcer/skin breakdown will have a volume reduction of 80% by week 12 Date Initiated: 01/18/2018 Target Resolution Date: 04/21/2018 Goal Status: Active Interventions: Assess patient/caregiver ability to perform ulcer/skin care regimen upon admission and as needed Assess ulceration(s) every visit Notes: Electronic Signature(s) Signed: 04/12/2018 4:29:22 PM By: Alric Quan Entered By: Alric Quan on 04/12/2018 11:46:00 [name], [name] ([id number]) -------------------------------------------------------------------------------- Pain Assessment Details Patient Name: Heather Dear A. Date of Service: 04/12/2018 11:15 AM Medical Record Number: [id number] Patient Account Number: [id number] Date of Birth/Sex: 08-Jun-1976 (42 y.o. F) Treating RN: [name] Primary Care Gerrett Loman: [name] Other Clinician: Referring Alexi Dorminey: [name] Treating Bethany Hirt/Extender: [name] in Treatment: 12  Active Problems Location of Pain Severity and Description of Pain Patient Has Paino No Site Locations Pain Management and Medication Current Pain Management: Electronic Signature(s) Signed: 04/12/2018 4:12:59 PM By: [name] Entered By: [name] on 04/12/2018 11:26:52 [name], [name] ([id number]) -------------------------------------------------------------------------------- Wound Assessment Details Patient Name: Heather Dear A. Date of Service: 04/12/2018 11:15 AM Medical Record Number: [id number] Patient Account Number: [id number] Date of Birth/Sex: [birth year] (42 y.o. F) Treating RN: [name] Primary Care Sigmond Patalano: [name] Other Clinician: Referring Tenia Goh: [name] Treating Matias Thurman/Extender: [name] in Treatment: 12 Wound Status Wound Number: 1 Primary Diabetic Wound/Ulcer of the Lower Extremity Etiology: Wound Location: Left Toe Great Wound Open Wounding Event: Gradually Appeared Status: Date Acquired: 01/01/2018 Comorbid Chronic Obstructive Pulmonary Disease Weeks Of Treatment: 12 History: (COPD), Congestive Heart Failure, Clustered Wound: No Hypertension, Type II Diabetes, Neuropathy, Pending Amputation On Presentation Seizure Disorder Photos Photo Uploaded By: [name] on 04/12/2018 11:39:17 Wound Measurements Length: (cm) 0.3 Width: (cm) 0.1 Depth: (cm) 0.2 Area: (cm) 0.024 Volume: (cm) 0.005 % Reduction in Area: 99.7% % Reduction in Volume: 99.8% Epithelialization: None Tunneling: No Undermining: No Wound Description Classification: Grade 2 Wound Margin: Flat and Intact Exudate Amount: Small Exudate Type: Serosanguineous Exudate Color: red, brown Foul Odor After Cleansing: No Slough/Fibrino Yes Wound Bed Granulation Amount: Large (67-100%) Exposed Structure Granulation Quality: Pink Fascia Exposed: No Necrotic Amount: Small (1-33%) Fat Layer (Subcutaneous Tissue) Exposed: Yes Necrotic Quality: Adherent Slough Tendon Exposed: No Muscle Exposed: No Joint Exposed: No Bone Exposed: No [name], [name] ([id number]) Periwound Skin Texture Texture Color No Abnormalities Noted: No No Abnormalities Noted: No Callus: Yes Atrophie Blanche: No Crepitus: No Cyanosis: No Excoriation: No Ecchymosis: No Induration: No Erythema: Yes Rash: No Erythema Location: Circumferential Scarring: No Hemosiderin Staining: No Mottled: No Moisture Pallor: No No Abnormalities Noted: No Rubor: No Dry / Scaly: No Maceration: Yes Temperature / Pain Temperature: No Abnormality Wound Preparation Ulcer Cleansing:  Rinsed/Irrigated with Saline Topical Anesthetic Applied: Other: lidocaine 4%, Electronic Signature(s) Signed: 04/12/2018 4:12:59 PM By: [name] Entered By: [name] on 04/12/2018 11:32:33 [name] ([id number]) -------------------------------------------------------------------------------- Vitals Details Patient Name: Heather Dear A. Date of Service: 04/12/2018 11:15 AM Medical Record Number: [id number] Patient Account Number: [id number] Date of Birth/Sex: 1976-04-01 (42 y.o. F) Treating RN: [name] Primary Care Tahni Porchia: [name] Other Clinician: Referring Rashmi Tallent: [name] Treating Lateesha Bezold/Extender: [name] in Treatment: 12 Vital Signs Time Taken: 11:26 Temperature (F): 98.5 Height (in): 69 Pulse (bpm): 109 Weight (lbs): 250 Respiratory Rate (breaths/min): 18 Body Mass Index (BMI): 36.9 Blood Pressure (mmHg): 97/45 Reference Range: 80 - 120 mg / dl Electronic Signature(s) Signed: 04/12/2018 4:12:59 PM By: [name] Entered By: [name] on 04/12/2018 11:28:26

## 2018-04-19 ENCOUNTER — Ambulatory Visit: Payer: Self-pay | Admitting: Nurse Practitioner

## 2018-05-03 ENCOUNTER — Encounter: Payer: Medicaid Other | Attending: Nurse Practitioner | Admitting: Nurse Practitioner

## 2018-05-03 DIAGNOSIS — I509 Heart failure, unspecified: Secondary | ICD-10-CM | POA: Diagnosis not present

## 2018-05-03 DIAGNOSIS — Z794 Long term (current) use of insulin: Secondary | ICD-10-CM | POA: Insufficient documentation

## 2018-05-03 DIAGNOSIS — E11621 Type 2 diabetes mellitus with foot ulcer: Secondary | ICD-10-CM | POA: Diagnosis present

## 2018-05-03 DIAGNOSIS — J449 Chronic obstructive pulmonary disease, unspecified: Secondary | ICD-10-CM | POA: Diagnosis not present

## 2018-05-03 DIAGNOSIS — Z8249 Family history of ischemic heart disease and other diseases of the circulatory system: Secondary | ICD-10-CM | POA: Insufficient documentation

## 2018-05-03 DIAGNOSIS — I110 Hypertensive heart disease with heart failure: Secondary | ICD-10-CM | POA: Diagnosis not present

## 2018-05-03 DIAGNOSIS — E1140 Type 2 diabetes mellitus with diabetic neuropathy, unspecified: Secondary | ICD-10-CM | POA: Diagnosis not present

## 2018-05-03 DIAGNOSIS — E1151 Type 2 diabetes mellitus with diabetic peripheral angiopathy without gangrene: Secondary | ICD-10-CM | POA: Diagnosis not present

## 2018-05-03 DIAGNOSIS — G40909 Epilepsy, unspecified, not intractable, without status epilepticus: Secondary | ICD-10-CM | POA: Insufficient documentation

## 2018-05-03 DIAGNOSIS — Z881 Allergy status to other antibiotic agents status: Secondary | ICD-10-CM | POA: Diagnosis not present

## 2018-05-03 DIAGNOSIS — F17210 Nicotine dependence, cigarettes, uncomplicated: Secondary | ICD-10-CM | POA: Diagnosis not present

## 2018-05-03 DIAGNOSIS — Z9119 Patient's noncompliance with other medical treatment and regimen: Secondary | ICD-10-CM | POA: Diagnosis not present

## 2018-05-03 DIAGNOSIS — L97529 Non-pressure chronic ulcer of other part of left foot with unspecified severity: Secondary | ICD-10-CM | POA: Diagnosis not present

## 2018-05-05 NOTE — Progress Notes (Signed)
[name], [name] ([id number]) Visit Report for 05/03/2018 Arrival Information Details Patient Name: [name], [name] Date of Service: 05/03/2018 2:00 PM Medical Record Number: [id number] Patient Account Number: [id number] Date of Birth/Sex: 05-May-1976 (42 y.o. F) Treating RN: Secundino Ginger Primary Care Charlee Whitebread: Suzan Garibaldi Other Clinician: Referring Millette Halberstam: Suzan Garibaldi Treating Theodus Ran/Extender: Cathie Olden in Treatment: 15 Visit Information History Since Last Visit Added or deleted any medications: No Patient Arrived: Wheel Chair Any new allergies or adverse reactions: No Arrival Time: 14:17 Had a fall or experienced change in No Accompanied By: family activities of daily living that may affect Transfer Assistance: None risk of falls: Patient Identification Verified: Yes Signs or symptoms of abuse/neglect since last visito No Secondary Verification Process Completed: Yes Hospitalized since last visit: No Patient Has Alerts: Yes Implantable device outside of the clinic excluding No Patient Alerts: DMII cellular tissue based products placed in the center since last visit: Has Dressing in Place as Prescribed: Yes Pain Present Now: No Electronic Signature(s) Signed: 05/03/2018 3:51:06 PM By: Secundino Ginger Entered By: Secundino Ginger on 05/03/2018 14:17:56 [name] ([id number]) -------------------------------------------------------------------------------- Encounter Discharge Information Details Patient Name: Heather Dear A. Date of Service: 05/03/2018 2:00 PM Medical Record Number: [id number] Patient Account Number: [id number] Date of Birth/Sex: May 23, 1976 (42 y.o. F) Treating RN: Roger Shelter Primary Care Onesti Bonfiglio: Suzan Garibaldi Other Clinician: Referring Jessejames Steelman: Suzan Garibaldi Treating Koral Thaden/Extender: Cathie Olden in Treatment: 15 Encounter Discharge Information Items Discharge Condition: Stable Ambulatory Status: Ambulatory Discharge  Destination: Home Transportation: Private Auto Schedule Follow-up Appointment: Yes Clinical Summary of Care: Electronic Signature(s) Signed: 05/04/2018 10:58:55 AM By: Roger Shelter Entered By: Roger Shelter on 05/03/2018 15:03:44 Mittelstaedt, Janaiya A. ([id number]) -------------------------------------------------------------------------------- Lower Extremity Assessment Details Patient Name: Heather Dear A. Date of Service: 05/03/2018 2:00 PM Medical Record Number: [id number] Patient Account Number: [id number] Date of Birth/Sex: 1976-09-04 (42 y.o. F) Treating RN: Secundino Ginger Primary Care Jurell Basista: Suzan Garibaldi Other Clinician: Referring Dawaun Brancato: Suzan Garibaldi Treating Waino Mounsey/Extender: Cathie Olden in Treatment: 15 Electronic Signature(s) Signed: 05/03/2018 3:51:06 PM By: Secundino Ginger Entered By: Secundino Ginger on 05/03/2018 14:25:45 [name] ([id number]) -------------------------------------------------------------------------------- Multi Wound Chart Details Patient Name: Heather Dear A. Date of Service: 05/03/2018 2:00 PM Medical Record Number: [id number] Patient Account Number: [id number] Date of Birth/Sex: 1976-05-20 (42 y.o. F) Treating RN: Ahmed Prima Primary Care Liyla Radliff: Suzan Garibaldi Other Clinician: Referring [name] Gehring: Suzan Garibaldi Treating Stephanie Mcglone/Extender: Cathie Olden in Treatment: 15 Vital Signs Height(in): 69 Pulse(bpm): 88 Weight(lbs): 250 Blood Pressure(mmHg): 115/61 Body Mass Index(BMI): 37 Temperature(F): 98.4 Respiratory Rate 18 (breaths/min): Photos: [N/A:N/A] Wound Location: Left Toe Great N/A N/A Wounding Event: Gradually Appeared N/A N/A Primary Etiology: Diabetic Wound/Ulcer of the N/A N/A Lower Extremity Comorbid History: Chronic Obstructive N/A N/A Pulmonary Disease (COPD), Congestive Heart Failure, Hypertension, Type II Diabetes, Neuropathy, Seizure Disorder Date Acquired: 01/01/2018 N/A N/A Weeks of Treatment:  15 N/A N/A Wound Status: Open N/A N/A Pending Amputation on Yes N/A N/A Presentation: Measurements L x W x D 0.3x0.3x0.2 N/A N/A (cm) Area (cm) : 0.071 N/A N/A Volume (cm) : 0.014 N/A N/A % Reduction in Area: 99.10% N/A N/A % Reduction in Volume: 99.50% N/A N/A Classification: Grade 2 N/A N/A Exudate Amount: Small N/A N/A Exudate Type: Serosanguineous N/A N/A Exudate Color: red, brown N/A N/A Wound Margin: Flat and Intact N/A N/A Granulation Amount: None Present (0%) N/A N/A Necrotic Amount: Small (1-33%) N/A N/A Exposed Structures: N/A N/A [name], [name] ([id number]) Fat Layer (Subcutaneous Tissue) Exposed: Yes Fascia: No Tendon:  No Muscle: No Joint: No Bone: No Epithelialization: None N/A N/A Debridement: Debridement - Excisional N/A N/A Pre-procedure 14:44 N/A N/A Verification/Time Out Taken: Pain Control: Lidocaine 4% Topical Solution N/A N/A Tissue Debrided: Callus, Subcutaneous, Slough N/A N/A Level: Skin/Subcutaneous Tissue N/A N/A Debridement Area (sq cm): 0.09 N/A N/A Instrument: Curette N/A N/A Bleeding: Minimum N/A N/A Hemostasis Achieved: Pressure N/A N/A Procedural Pain: 0 N/A N/A Post Procedural Pain: 0 N/A N/A Debridement Treatment Procedure was tolerated well N/A N/A Response: Post Debridement 0.3x0.3x0.3 N/A N/A Measurements L x W x D (cm) Post Debridement Volume: 0.021 N/A N/A (cm) Periwound Skin Texture: Callus: Yes N/A N/A Excoriation: No Induration: No Crepitus: No Rash: No Scarring: No Periwound Skin Moisture: Maceration: Yes N/A N/A Dry/Scaly: No Periwound Skin Color: Erythema: Yes N/A N/A Atrophie Blanche: No Cyanosis: No Ecchymosis: No Hemosiderin Staining: No Mottled: No Pallor: No Rubor: No Erythema Location: Circumferential N/A N/A Temperature: No Abnormality N/A N/A Tenderness on Palpation: No N/A N/A Wound Preparation: Ulcer Cleansing: N/A N/A Rinsed/Irrigated with Saline Topical Anesthetic Applied: Other:  lidocaine 4% Procedures Performed: Debridement N/A N/A Treatment Notes Electronic Signature(s) Signed: 05/03/2018 2:58:23 PM By: Elmer Sow, Latrenda A. ([id number]) Entered By: Lawanda Cousins on 05/03/2018 14:58:23 [name] ([id number]) -------------------------------------------------------------------------------- [place] Details Patient Name: Heather Dear A. Date of Service: 05/03/2018 2:00 PM Medical Record Number: [id number] Patient Account Number: [id number] Date of Birth/Sex: [birth year] (42 y.o. F) Treating RN: Ahmed Prima Primary Care Aidin Doane: Suzan Garibaldi Other Clinician: Referring Locklyn Henriquez: Suzan Garibaldi Treating Amayrani Bennick/Extender: Cathie Olden in Treatment: 15 Active Inactive ` Abuse / Safety / Falls / Self Care Management Nursing Diagnoses: Potential for falls Goals: Patient will not experience any injury related to falls Date Initiated: 01/18/2018 Target Resolution Date: 04/28/2018 Goal Status: Active Interventions: Assess fall risk on admission and as needed Assess: immobility, friction, shearing, incontinence upon admission and as needed Assess impairment of mobility on admission and as needed per policy Assess personal safety and home safety (as indicated) on admission and as needed Assess self care needs on admission and as needed Notes: ` Nutrition Nursing Diagnoses: Imbalanced nutrition Impaired glucose control: actual or potential Potential for alteratiion in Nutrition/Potential for imbalanced nutrition Goals: Patient/caregiver agrees to and verbalizes understanding of need to use nutritional supplements and/or vitamins as prescribed Date Initiated: 01/18/2018 Target Resolution Date: 04/28/2018 Goal Status: Active Patient/caregiver will maintain therapeutic glucose control Date Initiated: 01/18/2018 Target Resolution Date: 04/28/2018 Goal Status: Active Interventions: Assess patient nutrition upon admission  and as needed per policy Provide education on elevated blood sugars and impact on wound healing Notes: CHIMERE, KLINGENSMITH ([id number]) Orientation to the Wound Care Program Nursing Diagnoses: Knowledge deficit related to the wound healing center program Goals: Patient/caregiver will verbalize understanding of the [place] Date Initiated: 01/18/2018 Target Resolution Date: 01/27/2018 Goal Status: Active Interventions: Provide education on orientation to the wound center Notes: ` Wound/Skin Impairment Nursing Diagnoses: Impaired tissue integrity Knowledge deficit related to smoking impact on wound healing Knowledge deficit related to ulceration/compromised skin integrity Goals: Ulcer/skin breakdown will have a volume reduction of 80% by week 12 Date Initiated: 01/18/2018 Target Resolution Date: 04/21/2018 Goal Status: Active Interventions: Assess patient/caregiver ability to perform ulcer/skin care regimen upon admission and as needed Assess ulceration(s) every visit Notes: Electronic Signature(s) Signed: 05/04/2018 11:31:29 AM By: Alric Quan Entered By: Alric Quan on 05/03/2018 14:44:13 Mantia, Kelbie A. ([id number]) -------------------------------------------------------------------------------- Pain Assessment Details Patient Name: Heather Dear A. Date of Service: 05/03/2018 2:00 PM  Medical Record Number: [id number] Patient Account Number: [id number] Date of Birth/Sex: [birth year] (42 y.o. F) Treating RN: Secundino Ginger Primary Care Santia Labate: Suzan Garibaldi Other Clinician: Referring Zenora Karpel: Suzan Garibaldi Treating Ameri Cahoon/Extender: Cathie Olden in Treatment: 15 Active Problems Location of Pain Severity and Description of Pain Patient Has Paino No Site Locations Pain Management and Medication Current Pain Management: Electronic Signature(s) Signed: 05/03/2018 3:51:06 PM By: Secundino Ginger Entered By: Secundino Ginger on 05/03/2018 14:18:10 [name] ([id number]) -------------------------------------------------------------------------------- Patient/Caregiver Education Details Patient Name: Heather Dear A. Date of Service: 05/03/2018 2:00 PM Medical Record Number: [id number] Patient Account Number: [id number] Date of Birth/Gender: October 13, 1976 (42 y.o. F) Treating RN: Roger Shelter Primary Care Physician: Suzan Garibaldi Other Clinician: Referring Physician: Suzan Garibaldi Treating Physician/Extender: Cathie Olden in Treatment: 15 Education Assessment Education Provided To: Patient Education Topics Provided Wound Debridement: Handouts: Wound Debridement Methods: Explain/Verbal Responses: State content correctly Wound/Skin Impairment: Handouts: Caring for Your Ulcer Methods: Explain/Verbal Responses: State content correctly Electronic Signature(s) Signed: 05/04/2018 10:58:55 AM By: Roger Shelter Entered By: Roger Shelter on 05/03/2018 15:04:01 Pacella, Roderica A. ([id number]) -------------------------------------------------------------------------------- Wound Assessment Details Patient Name: Heather Dear A. Date of Service: 05/03/2018 2:00 PM Medical Record Number: [id number] Patient Account Number: [id number] Date of Birth/Sex: 05-Aug-1976 (42 y.o. F) Treating RN: Secundino Ginger Primary Care Nakia Koble: Suzan Garibaldi Other Clinician: Referring Masiya Claassen: Suzan Garibaldi Treating Micai Apolinar/Extender: Cathie Olden in Treatment: 15 Wound Status Wound Number: 1 Primary Diabetic Wound/Ulcer of the Lower Extremity Etiology: Wound Location: Left Toe Great Wound Open Wounding Event: Gradually Appeared Status: Date Acquired: 01/01/2018 Comorbid Chronic Obstructive Pulmonary Disease Weeks Of Treatment: 15 History: (COPD), Congestive Heart Failure, Clustered Wound: No Hypertension, Type II Diabetes, Neuropathy, Pending Amputation On Presentation Seizure Disorder Photos Photo Uploaded By: Secundino Ginger on  05/03/2018 14:38:30 Wound Measurements Length: (cm) 0.3 % Reduction Width: (cm) 0.3 % Reduction Depth: (cm) 0.2 Epitheliali Area: (cm) 0.071 Tunneling: Volume: (cm) 0.014 Underminin in Area: 99.1% in Volume: 99.5% zation: None No g: No Wound Description Classification: Grade 2 Foul Odor Wound Margin: Flat and Intact Slough/Fib Exudate Amount: Small Exudate Type: Serosanguineous Exudate Color: red, brown After Cleansing: No rino Yes Wound Bed Granulation Amount: None Present (0%) Exposed Structure Necrotic Amount: Small (1-33%) Fascia Exposed: No Necrotic Quality: Adherent Slough Fat Layer (Subcutaneous Tissue) Exposed: Yes Tendon Exposed: No Muscle Exposed: No Joint Exposed: No Bone Exposed: No Spittler, Pilar A. ([id number]) Periwound Skin Texture Texture Color No Abnormalities Noted: No No Abnormalities Noted: No Callus: Yes Atrophie Blanche: No Crepitus: No Cyanosis: No Excoriation: No Ecchymosis: No Induration: No Erythema: Yes Rash: No Erythema Location: Circumferential Scarring: No Hemosiderin Staining: No Mottled: No Moisture Pallor: No No Abnormalities Noted: No Rubor: No Dry / Scaly: No Maceration: Yes Temperature / Pain Temperature: No Abnormality Wound Preparation Ulcer Cleansing: Rinsed/Irrigated with Saline Topical Anesthetic Applied: Other: lidocaine 4%, Treatment Notes Wound #1 (Left Toe Great) 1. Cleansed with: Clean wound with Normal Saline 2. Anesthetic Topical Lidocaine 4% cream to wound bed prior to debridement 4. Dressing Applied: Hydrafera Blue 5. Secondary Dressing Applied Dry Gauze Notes conform wrap Electronic Signature(s) Signed: 05/03/2018 3:51:06 PM By: Secundino Ginger Entered By: Secundino Ginger on 05/03/2018 14:24:37 [name] ([id number]) -------------------------------------------------------------------------------- [place] Details Patient Name: Heather Dear A. Date of Service: 05/03/2018 2:00 PM Medical  Record Number: [id number] Patient Account Number: [id number] Date of Birth/Sex: 11-Jan-1976 (42 y.o. F) Treating RN: Secundino Ginger Primary Care Kensley Valladares: Suzan Garibaldi Other Clinician: Referring Jace Fermin: Suzan Garibaldi Treating [place] Kappes/Extender: Cathie Olden in  Treatment: 15 Vital Signs Time Taken: 14:10 Temperature (F): 98.4 Height (in): 69 Pulse (bpm): 88 Weight (lbs): 250 Respiratory Rate (breaths/min): 18 Body Mass Index (BMI): 36.9 Blood Pressure (mmHg): 115/61 Reference Range: 80 - 120 mg / dl Electronic Signature(s) Signed: 05/03/2018 3:51:06 PM By: Secundino Ginger Entered By: Secundino Ginger on 05/03/2018 14:19:21

## 2018-05-05 NOTE — Progress Notes (Signed)
TERRIAN, RIDLON ([id number]) Visit Report for 05/03/2018 Chief Complaint Document Details Patient Name: SYRINA, [name] Date of Service: 05/03/2018 2:00 PM Medical Record Number: [id number] Patient Account Number: [id number] Date of Birth/Sex: 05-18-1976 (42 y.o. F) Treating RN: Ahmed Prima Primary Care Provider: Suzan Garibaldi Other Clinician: Referring Provider: Suzan Garibaldi Treating Provider/Extender: Cathie Olden in Treatment: 15 Information Obtained from: Patient Chief Complaint She is here for evaluation of a left great toe ulcer Electronic Signature(s) Signed: 05/03/2018 2:58:38 PM By: Lawanda Cousins Entered By: Lawanda Cousins on 05/03/2018 14:58:38 [name] ([id number]) -------------------------------------------------------------------------------- Debridement Details Patient Name: Heather Dear A. Date of Service: 05/03/2018 2:00 PM Medical Record Number: [id number] Patient Account Number: [id number] Date of Birth/Sex: [birth year] (42 y.o. F) Treating RN: Ahmed Prima Primary Care Provider: Suzan Garibaldi Other Clinician: Referring Provider: Suzan Garibaldi Treating Provider/Extender: Cathie Olden in Treatment: 15 Debridement Performed for Wound #1 Left Toe Great Assessment: Performed By: Physician Lawanda Cousins, NP Debridement Type: Debridement Severity of Tissue Pre Fat layer exposed Debridement: Pre-procedure Verification/Time Yes - 14:44 Out Taken: Start Time: 14:44 Pain Control: Lidocaine 4% Topical Solution Total Area Debrided (L x W): 0.3 (cm) x 0.3 (cm) = 0.09 (cm) Tissue and other material Viable, Non-Viable, Callus, Slough, Subcutaneous, Fibrin/Exudate, Slough debrided: Level: Skin/Subcutaneous Tissue Debridement Description: Excisional Instrument: Curette Bleeding: Minimum Hemostasis Achieved: Pressure End Time: 14:46 Procedural Pain: 0 Post Procedural Pain: 0 Response to Treatment: Procedure was tolerated well Level of  Consciousness: Awake and Alert Post Debridement Measurements of Total Wound Length: (cm) 0.3 Width: (cm) 0.3 Depth: (cm) 0.3 Volume: (cm) 0.021 Character of Wound/Ulcer Post Debridement: Requires Further Debridement Severity of Tissue Post Debridement: Fat layer exposed Post Procedure Diagnosis Same as Pre-procedure Electronic Signature(s) Signed: 05/03/2018 9:10:59 PM By: Lawanda Cousins Signed: 05/04/2018 11:31:29 AM By: Alric Quan Entered By: Alric Quan on 05/03/2018 14:45:56 [name], [name] ([id number]) -------------------------------------------------------------------------------- HPI Details Patient Name: Heather Dear A. Date of Service: 05/03/2018 2:00 PM Medical Record Number: [id number] Patient Account Number: [id number] Date of Birth/Sex: 09-13-1976 (42 y.o. F) Treating RN: Ahmed Prima Primary Care Provider: Suzan Garibaldi Other Clinician: Referring Provider: Suzan Garibaldi Treating Provider/Extender: Cathie Olden in Treatment: 15 History of Present Illness HPI Description: 01/18/18-She is here for initial evaluation of the left great toe ulcer. She is a poor historian in regards to timeframe in detail. She states approximately 4 weeks ago she lacerated her toe on something in the house. She followed up with her primary care who placed her on Bactrim and ultimately a second dose of Bactrim prior to coming to wound clinic. She states she has been treating the toe with peroxide, Betadine and a Band-Aid. She did not check her blood sugar this morning but checked it yesterday morning it was 327; she is unaware of a recent A1c and there are no current records. She saw Dr. she would've orthopedics last week for an old injury to the left ankle, she states he did not see her toe, nor did she bring it to his attention. She smokes approximately 1 pack cigarettes a day. Her social situation is concerning, she arrives this morning with her mother who appears  extremely intoxicated/under the influence; her mother was asked to leave the room and be monitored by the patient's grandmother. The patient's aunt then accompanied the patient and the room throughout the rest of the appointment. We had a lengthy discussion regarding the deleterious effects of uncontrolled hyperglycemia and smoking as it relates to wound healing and overall health.  She was strongly encouraged to decrease her smoking and get her diabetes under better control. She states she is currently on a diet and has cut down her [facility] consumption. The left toe is erythematous, macerated and slightly edematous with malodor present. The edema in her left foot is below her baseline, there is no erythema streaking. We will treat her with Santyl, doxycycline; we have ordered and xray, culture and provided a Peg assist surgical shoe and cultured the wound. 01/25/18-She is here in follow-up evaluation for a left great toe ulcer and presents with an abscess to her suprapubic area. She states her blood sugars remain elevated, feeling "sick" and if levels are below 250, but she is trying. She has made no attempt to decrease her smoking stating that we "can't take away her food in her cigarettes". She has been compliant with offloading using the PEG assist you. She is using Santyl daily. the culture obtained last week grew staph aureus and Enterococcus faecalis; continues on the doxycycline and Augmentin was added on Monday. The suprapubic area has erythema, no femoral variation, purple discoloration, minimal induration, was accessed with a cotton tip applicator with sanguinopurulent drainage, this was cultured, I suspect the current antibiotic treatment will cover and we will not add anything to her current treatment plan. She was advised to go to urgent care or ER with any change in redness, induration or fever. 02/01/18-She is here in follow-up evaluation for left great toe ulcers and a new abdominal  abscess from last week. She was able to use packing until earlier this week, where she "forgot it was there". She states she was feeling ill with GI symptoms last week and was not taking her antibiotic. She states her glucose levels have been predominantly less than 200, with occasional levels between 200-250. She thinks this was contributing to her GI symptoms as they have resolved without intervention. There continues to be significant laceration to left toe, otherwise it clinically looks stable/improved. There is now less superficial opening to the lateral aspect of the great toe that was residual blister. We will transition to [facility] to all wounds, she will continue her Augmentin. If there is no change or deterioration next week for reculture. 02/08/18-She is here in follow-up evaluation for left great toe ulcer and abdominal ulcer. There is an improvement in both wounds. She has been wrapping her left toe with coban, not by our direction, which has created an area of discoloration to the medial aspect; she has been advised to NOT use coban secondary to her neuropathy. She states her glucose levels have been high over this last week ranging from 200-350, she continues to smoke. She admits to being less compliant with her offloading shoe. We will continue with same treatment plan and she will follow-up next week. 02/15/18-She is here in follow-up evaluation for left great toe ulcer and abdominal ulcer. The abdominal ulcer is epithelialized. The left great toe ulcer is improved and all injury from last week using the Coban wrap is resolved, the lateral ulcer is healed. She admits to noncompliance with wearing offloading shoe and admits to glucose levels being greater than 300 most of the week. She continues to smoke and expresses no desire to quit. There is one area medially that probes deeper than it has historically, erythema to the toe and dorsal foot has consistently waxed and waned.  There is no overt signs of cellulitis or infection but we will culture the wound for any occult infection given  the new area of depth and erythema. We will hold off on sensitivities for initiation of antibiotic therapy. 02/22/18-She is here in follow up evaluation for left great toe ulcer. There is overall significant improvement in both wound appearance, erythema and edema with changes made last week. She was not initiated on antibiotic therapy. Culture obtained last week showed oxacillin sensitive staph aureus, sensitive to clindamycin. Clindamycin has been called into the pharmacy but she has been instructed to hold off on initiation secondary to overall clinical improvement and her history of antibiotic intolerance. She has been instructed to contact the clinic with any noted changes/deterioration and the wound, erythema, Kreiter, Temeca A. ([id number]) edema and/or pain. She will follow-up next week. She continues to smoke and her glucose levels remain elevated >250; she admits to compliance with offloading shoe 03/01/18 on evaluation today patient appears to be doing fairly well in regard to her left first toe ulcer. She has been tolerating the dressing changes with the [facility] Dressing without complication and overall this has definitely showed signs of improvement according to records as well is what the patient tells me today. I'm very pleased in that regard. She is having no pain today 03/08/18 She is here for follow up evaluation of a left great toe ulcer. She remains non-compliant with glucose control and smoking cessation; glucose levels consistently >200. She states that she got new shoe inserts/peg assist. She admits to compliance with offloading. Since my last evaluation there is significant improvement. We will switch to prisma at this time and she will follow up next week. She is noted to be tachycardic at this appointment, heart rate 120s; she has a history of heart rate 70-130  according to our records. She admits to extreme agitation r/t personal issues; she was advised to monitor her heartrate and contact her physician if it does not return to a more normal range (<100). She takes cardizem twice daily. 03/15/18-She is here in follow-up evaluation for left great toe ulcer. She remains noncompliant with glucose control and smoking cessation. She admits to compliance with wearing offloading shoe. The ulcer is improved/stable and we will continue with the same treatment plan and she will follow-up next week 03/22/18-She is here for evaluation for left great toe ulcer. There continues to be significant improvement despite recurrent hyperglycemia (over 500 yesterday) and she continues to smoke. She has been compliant with offloading and we will continue with same treatment plan and she will follow-up next week. 03/29/18-She is here for evaluation for left great toe ulcer. Despite continuing to smoke and uncontrolled diabetes she continues to improve. She is compliant with offloading shoe. We will continue with the same treatment plan and she will follow-up next week 04/05/18- She is here in follow up evaluation for a left great toe ulcer; she presents with small pustule to left fifth toe (resembles ant bite). She admits to compliance with wearing offloading shoe; continues to smoke or have uncontrolled blood glucose control. There is more callus than usual with evidence of bleeding; she denies known trauma. 04/12/18-She is here for evaluation of left great toe ulcer. Despite noncompliance with glycemic control and smoking she continues to make improvement. She continues to wear offloading shoe. The pustule, that was identified last week, to the left fifth toe is resolved. She will follow-up in 2 weeks 05/03/18-she is seen in follow-up evaluation for a left great toe ulcer. She is compliant with offloading, otherwise noncompliant with glycemic control and smoking. She has plateaued  and  there is minimal improvement noted. We will transition to [facility], replaced the insert to her surgical shoe and she will follow-up in one week Electronic Signature(s) Signed: 05/03/2018 2:59:43 PM By: Lawanda Cousins Entered By: Lawanda Cousins on 05/03/2018 14:59:42 [name] ([id number]) -------------------------------------------------------------------------------- Physician Orders Details Patient Name: Heather Dear A. Date of Service: 05/03/2018 2:00 PM Medical Record Number: [id number] Patient Account Number: [id number] Date of Birth/Sex: [birth year] (41 y.o. F) Treating RN: Ahmed Prima Primary Care Provider: Suzan Garibaldi Other Clinician: Referring Provider: Suzan Garibaldi Treating Provider/Extender: Cathie Olden in Treatment: 15 Verbal / Phone Orders: Yes Clinician: Carolyne Fiscal, Debi Read Back and Verified: Yes Diagnosis Coding Wound Cleansing Wound #1 Left Toe Great o Clean wound with Normal Saline. o Cleanse wound with mild soap and water o May Shower, gently pat wound dry prior to applying new dressing. Anesthetic (add to Medication List) Wound #1 Left Toe Great o Topical Lidocaine 4% cream applied to wound bed prior to debridement (In Clinic Only). Primary Wound Dressing Wound #1 Left Toe Great o Hydrafera Blue Ready Transfer Secondary Dressing Wound #1 Left Toe Great o Dry Gauze o Conform/Kerlix Dressing Change Frequency Wound #1 Left Toe Great o Change dressing every other day. o Other: - as needed Follow-up Appointments Wound #1 Left Toe Great o Return Appointment in 1 week. Edema Control Wound #1 Left Toe Great o Elevate legs to the level of the heart and pump ankles as often as possible Off-Loading Wound #1 Left Toe Great o Open toe surgical shoe with peg assist. Additional Orders / Instructions Wound #1 Left Toe Great o Stop Smoking o Increase protein intake. o Other: - use Betadine on the left 5th  toe [name], [name] ([id number]) Patient Medications Allergies: erythromycin base Notifications Medication Indication Start End lidocaine DOSE 1 - topical 4 % cream - 1 cream topical Electronic Signature(s) Signed: 05/03/2018 9:10:59 PM By: Lawanda Cousins Signed: 05/04/2018 11:31:29 AM By: Alric Quan Entered By: Alric Quan on 05/03/2018 14:47:41 [name] ([id number]) -------------------------------------------------------------------------------- Prescription 05/03/2018 Patient Name: Heather Dear A. Provider: Lawanda Cousins NP Date of Birth: [birth year] NPI#: [id number] Sex: F DEA#: [id number] Phone #: [phone] License #: Patient Address: [facility] [place], [place] [address] [address], [place], [place] [address] [phone] Allergies erythromycin base Medication Medication: Route: Strength: Form: lidocaine topical 4% cream Class: TOPICAL LOCAL ANESTHETICS Dose: Frequency / Time: Indication: 1 1 cream topical Number of Refills: Number of Units: 0 Generic Substitution: Start Date: End Date: Administered at Substitution Permitted Facility: Yes Time Administered: Time Discontinued: Note to Pharmacy: Signature(s): Date(s): Electronic Signature(s) Signed: 05/03/2018 9:10:59 PM By: Lawanda Cousins Signed: 05/04/2018 11:31:29 AM By: Alric Quan Entered By: Alric Quan on 05/03/2018 14:47:42 [name] ([id number]) [name], [name] ([id number]) --------------------------------------------------------------------------------  Problem List Details Patient Name: Heather Dear A. Date of Service: 05/03/2018 2:00 PM Medical Record Number: [id number] Patient Account Number: [id number] Date of Birth/Sex: November 19, 1975 (42 y.o. 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 ([id number]) -------------------------------------------------------------------------------- Progress Note Details Patient Name: Heather Dear A. Date of Service: 05/03/2018 2:00 PM Medical Record Number: [id number] Patient Account Number: [id number] Date of Birth/Sex: 1976-06-20 (42 y.o. F) Treating RN: Ahmed Prima Primary Care Provider: Suzan Garibaldi Other Clinician: Referring Provider: Suzan Garibaldi Treating Provider/Extender: Cathie Olden in Treatment: 15 Subjective Chief Complaint Information obtained from Patient She is here for evaluation of a left great toe ulcer History of Present Illness (HPI) 01/18/18-She is here for initial evaluation of the left great toe ulcer. She is a poor historian in regards to timeframe in detail. She states approximately 4 weeks ago she lacerated her toe on something in the house. She followed up with her primary care who placed her on Bactrim and ultimately a second dose of Bactrim prior to coming to wound clinic. She states she has been treating the toe with peroxide, Betadine and a Band-Aid. She did not check her blood sugar this morning but checked it yesterday morning it was 327; she is unaware of a recent A1c and  there are no current records. She saw Dr. she would've orthopedics last week for an old injury to the left ankle, she states he did not see her toe, nor did she bring it to his attention. She smokes approximately 1 pack cigarettes a day. Her social situation is concerning, she arrives this morning with her mother who appears extremely intoxicated/under the influence; her mother was asked to leave the room and be monitored by the patient's grandmother. The patient's aunt then accompanied the patient and the room throughout the rest of the appointment. We had a lengthy discussion regarding the deleterious effects of uncontrolled hyperglycemia and smoking as it relates to wound healing and overall health. She was strongly encouraged to decrease her smoking and get her diabetes under better control. She states she is currently on a diet and has cut down her [facility] consumption. The left toe is erythematous, macerated and slightly edematous with malodor present. The edema in her left foot is below her baseline, there is no erythema streaking. We will treat her with Santyl, doxycycline; we have ordered and xray, culture and provided a Peg assist surgical shoe and cultured the wound. 01/25/18-She is here in follow-up evaluation for a left great toe ulcer and presents with an abscess to her suprapubic area. She states her blood sugars remain elevated, feeling "sick" and if levels are below 250, but she is trying. She has made no attempt to decrease her smoking stating that we "can't take away her food in her cigarettes". She has been compliant with offloading using the PEG assist you. She is using Santyl daily. the culture obtained last week grew staph aureus and Enterococcus faecalis; continues on the doxycycline and Augmentin was added on Monday. The suprapubic area has erythema, no femoral variation, purple discoloration, minimal induration, was accessed with a cotton tip applicator  with sanguinopurulent drainage, this was cultured, I suspect the current antibiotic treatment will cover and we will not add anything to her current treatment plan. She was advised to go to urgent care or ER with any change in redness, induration or fever. 02/01/18-She is here in follow-up evaluation for left great toe ulcers and a new abdominal abscess from last week. She was able to use packing until earlier this week,  where she "forgot it was there". She states she was feeling ill with GI symptoms last week and was not taking her antibiotic. She states her glucose levels have been predominantly less than 200, with occasional levels between 200-250. She thinks this was contributing to her GI symptoms as they have resolved without intervention. There continues to be significant laceration to left toe, otherwise it clinically looks stable/improved. There is now less superficial opening to the lateral aspect of the great toe that was residual blister. We will transition to [facility] to all wounds, she will continue her Augmentin. If there is no change or deterioration next week for reculture. 02/08/18-She is here in follow-up evaluation for left great toe ulcer and abdominal ulcer. There is an improvement in both wounds. She has been wrapping her left toe with coban, not by our direction, which has created an area of discoloration to the medial aspect; she has been advised to NOT use coban secondary to her neuropathy. She states her glucose levels have been high over this last week ranging from 200-350, she continues to smoke. She admits to being less compliant with her offloading shoe. We will continue with same treatment plan and she will follow-up next week. 02/15/18-She is here in follow-up evaluation for left great toe ulcer and abdominal ulcer. The abdominal ulcer is epithelialized. The left great toe ulcer is improved and all injury from last week using the Coban wrap is resolved, the lateral  ulcer is healed. She admits to noncompliance with wearing offloading shoe and admits to glucose levels being greater than 300 most of the week. She continues to smoke and expresses no desire to quit. There is one area medially that probes deeper than it has historically, erythema to the toe and dorsal foot has consistently waxed and waned. There is no overt signs of cellulitis or infection but we will culture the wound for any occult infection given the new area of depth and erythema. We will hold off on Paczkowski, Akeyla A. ([id number]) sensitivities for initiation of antibiotic therapy. 02/22/18-She is here in follow up evaluation for left great toe ulcer. There is overall significant improvement in both wound appearance, erythema and edema with changes made last week. She was not initiated on antibiotic therapy. Culture obtained last week showed oxacillin sensitive staph aureus, sensitive to clindamycin. Clindamycin has been called into the pharmacy but she has been instructed to hold off on initiation secondary to overall clinical improvement and her history of antibiotic intolerance. She has been instructed to contact the clinic with any noted changes/deterioration and the wound, erythema, edema and/or pain. She will follow-up next week. She continues to smoke and her glucose levels remain elevated >250; she admits to compliance with offloading shoe 03/01/18 on evaluation today patient appears to be doing fairly well in regard to her left first toe ulcer. She has been tolerating the dressing changes with the [facility] Dressing without complication and overall this has definitely showed signs of improvement according to records as well is what the patient tells me today. I'm very pleased in that regard. She is having no pain today 03/08/18 She is here for follow up evaluation of a left great toe ulcer. She remains non-compliant with glucose control and smoking cessation; glucose levels  consistently >200. She states that she got new shoe inserts/peg assist. She admits to compliance with offloading. Since my last evaluation there is significant improvement. We will switch to prisma at this time and she will follow up next  week. She is noted to be tachycardic at this appointment, heart rate 120s; she has a history of heart rate 70-130 according to our records. She admits to extreme agitation r/t personal issues; she was advised to monitor her heartrate and contact her physician if it does not return to a more normal range (<100). She takes cardizem twice daily. 03/15/18-She is here in follow-up evaluation for left great toe ulcer. She remains noncompliant with glucose control and smoking cessation. She admits to compliance with wearing offloading shoe. The ulcer is improved/stable and we will continue with the same treatment plan and she will follow-up next week 03/22/18-She is here for evaluation for left great toe ulcer. There continues to be significant improvement despite recurrent hyperglycemia (over 500 yesterday) and she continues to smoke. She has been compliant with offloading and we will continue with same treatment plan and she will follow-up next week. 03/29/18-She is here for evaluation for left great toe ulcer. Despite continuing to smoke and uncontrolled diabetes she continues to improve. She is compliant with offloading shoe. We will continue with the same treatment plan and she will follow-up next week 04/05/18- She is here in follow up evaluation for a left great toe ulcer; she presents with small pustule to left fifth toe (resembles ant bite). She admits to compliance with wearing offloading shoe; continues to smoke or have uncontrolled blood glucose control. There is more callus than usual with evidence of bleeding; she denies known trauma. 04/12/18-She is here for evaluation of left great toe ulcer. Despite noncompliance with glycemic control and smoking she continues  to make improvement. She continues to wear offloading shoe. The pustule, that was identified last week, to the left fifth toe is resolved. She will follow-up in 2 weeks 05/03/18-she is seen in follow-up evaluation for a left great toe ulcer. She is compliant with offloading, otherwise noncompliant with glycemic control and smoking. She has plateaued and there is minimal improvement noted. We will transition to [facility], replaced the insert to her surgical shoe and she will follow-up in one week Patient History Information obtained from Patient. Family History Cancer - Paternal Grandparents, Heart Disease - Paternal Grandparents, Hypertension - Paternal Grandparents, No family history of Diabetes, Hereditary Spherocytosis, Kidney Disease, Lung Disease, Seizures, Stroke, Thyroid Problems, Tuberculosis. Social History Current every day smoker, Marital Status - Single, Alcohol Use - Never, Drug Use - Prior History, Caffeine Use - Daily. [name], [name] ([id number]) Objective Constitutional Vitals Time Taken: 2:10 PM, Height: 69 in, Weight: 250 lbs, BMI: 36.9, Temperature: 98.4 F, Pulse: 88 bpm, Respiratory Rate: 18 breaths/min, Blood Pressure: 115/61 mmHg. Integumentary (Hair, Skin) Wound #1 status is Open. Original cause of wound was Gradually Appeared. The wound is located on the Left Toe Great. The wound measures 0.3cm length x 0.3cm width x 0.2cm depth; 0.071cm^2 area and 0.014cm^3 volume. There is Fat Layer (Subcutaneous Tissue) Exposed exposed. There is no tunneling or undermining noted. There is a small amount of serosanguineous drainage noted. The wound margin is flat and intact. There is no granulation within the wound bed. There is a small (1-33%) amount of necrotic tissue within the wound bed including Adherent Slough. The periwound skin appearance exhibited: Callus, Maceration, Erythema. The periwound skin appearance did not exhibit: Crepitus, Excoriation, Induration, Rash,  Scarring, Dry/Scaly, Atrophie Blanche, Cyanosis, Ecchymosis, Hemosiderin Staining, Mottled, Pallor, Rubor. The surrounding wound skin color is noted with erythema which is circumferential. Periwound temperature was noted as No Abnormality. Assessment Active Problems ICD-10 Type 2 diabetes mellitus  with foot ulcer Non-pressure chronic ulcer of other part of left foot limited to breakdown of skin Procedures Wound #1 Pre-procedure diagnosis of Wound #1 is a Diabetic Wound/Ulcer of the Lower Extremity located on the Left Toe Great .Severity of Tissue Pre Debridement is: Fat layer exposed. There was a Excisional Skin/Subcutaneous Tissue Debridement with a total area of 0.09 sq cm performed by Lawanda Cousins, NP. With the following instrument(s): Curette to remove Viable and Non-Viable tissue/material. Material removed includes Callus, Subcutaneous Tissue, Slough, and Fibrin/Exudate after achieving pain control using Lidocaine 4% Topical Solution. No specimens were taken. A time out was conducted at 14:44, prior to the start of the procedure. A Minimum amount of bleeding was controlled with Pressure. The procedure was tolerated well with a pain level of 0 throughout and a pain level of 0 following the procedure. Patient s Level of Consciousness post procedure was recorded as Awake and Alert. Post Debridement Measurements: 0.3cm length x 0.3cm width x 0.3cm depth; 0.021cm^3 volume. Character of Wound/Ulcer Post Debridement requires further debridement. Severity of Tissue Post Debridement is: Fat layer exposed. Post procedure Diagnosis Wound #1: Same as Pre-Procedure Plan Elbe, Allaya A. ([id number]) Wound Cleansing: Wound #1 Left Toe Great: Clean wound with Normal Saline. Cleanse wound with mild soap and water May Shower, gently pat wound dry prior to applying new dressing. Anesthetic (add to Medication List): Wound #1 Left Toe Great: Topical Lidocaine 4% cream applied to wound bed prior to  debridement (In Clinic Only). Primary Wound Dressing: Wound #1 Left Toe Great: Hydrafera Blue Ready Transfer Secondary Dressing: Wound #1 Left Toe Great: Dry Gauze Conform/Kerlix Dressing Change Frequency: Wound #1 Left Toe Great: Change dressing every other day. Other: - as needed Follow-up Appointments: Wound #1 Left Toe Great: Return Appointment in 1 week. Edema Control: Wound #1 Left Toe Great: Elevate legs to the level of the heart and pump ankles as often as possible Off-Loading: Wound #1 Left Toe Great: Open toe surgical shoe with peg assist. Additional Orders / Instructions: Wound #1 Left Toe Great: Stop Smoking Increase protein intake. Other: - use Betadine on the left 5th toe The following medication(s) was prescribed: lidocaine topical 4 % cream 1 1 cream topical was prescribed at facility Electronic Signature(s) Signed: 05/03/2018 3:00:10 PM By: Lawanda Cousins Entered By: Lawanda Cousins on 05/03/2018 15:00:09 [name] ([id number]) -------------------------------------------------------------------------------- ROS/PFSH Details Patient Name: Heather Dear A. Date of Service: 05/03/2018 2:00 PM Medical Record Number: [id number] Patient Account Number: [id number] Date of Birth/Sex: Mar 07, 1976 (42 y.o. F) Treating RN: Ahmed Prima Primary Care Provider: Suzan Garibaldi Other Clinician: Referring Provider: Suzan Garibaldi Treating Provider/Extender: Cathie Olden in Treatment: 15 Information Obtained From Patient Wound History Do you currently have one or more open woundso No Have you tested positive for osteomyelitis (bone infection)o No Have you had any tests for circulation on your legso No Hematologic/Lymphatic Medical History: Negative for: Anemia; Hemophilia; Human Immunodeficiency Virus; Lymphedema; Sickle Cell Disease Respiratory Medical History: Positive for: Chronic Obstructive Pulmonary Disease (COPD) Negative for: Aspiration; Asthma;  Pneumothorax; Sleep Apnea; Tuberculosis Cardiovascular Medical History: Positive for: Congestive Heart Failure; Hypertension Negative for: Angina; Arrhythmia; Coronary Artery Disease; Deep Vein Thrombosis; Hypotension; Myocardial Infarction; Peripheral Arterial Disease; Peripheral Venous Disease; Phlebitis; Vasculitis Gastrointestinal Medical History: Negative for: Cirrhosis ; Colitis; Crohnos; Hepatitis A; Hepatitis B; Hepatitis C Endocrine Medical History: Positive for: Type II Diabetes Treated with: Insulin, Oral agents Genitourinary Medical History: Negative for: End Stage Renal Disease Immunological Medical History: Negative for: Lupus Erythematosus; Raynaudos; Scleroderma  Musculoskeletal [name], [name] ([id number]) Medical History: Negative for: Gout; Rheumatoid Arthritis; Osteoarthritis; Osteomyelitis Neurologic Medical History: Positive for: Neuropathy; Seizure Disorder Oncologic Medical History: Negative for: Received Chemotherapy; Received Radiation Immunizations Pneumococcal Vaccine: Received Pneumococcal Vaccination: Yes Immunization Notes: up to date Implantable Devices Family and Social History Cancer: Yes - Paternal Grandparents; Diabetes: No; Heart Disease: Yes - Paternal Grandparents; Hereditary Spherocytosis: No; Hypertension: Yes - Paternal Grandparents; Kidney Disease: No; Lung Disease: No; Seizures: No; Stroke: No; Thyroid Problems: No; Tuberculosis: No; Current every day smoker; Marital Status - Single; Alcohol Use: Never; Drug Use: Prior History; Caffeine Use: Daily; Financial Concerns: No; Food, Clothing or Shelter Needs: No; Support System Lacking: No; Transportation Concerns: No; Advanced Directives: No; Patient does not want information on Advanced Directives Physician Affirmation I have reviewed and agree with the above information. Electronic Signature(s) Signed: 05/03/2018 9:10:59 PM By: Lawanda Cousins Signed: 05/04/2018 11:31:29 AM By:  Alric Quan Entered By: Lawanda Cousins on 05/03/2018 14:59:52 [name] ([id number]) -------------------------------------------------------------------------------- SuperBill Details Patient Name: Heather Dear A. Date of Service: 05/03/2018 Medical Record Number: [id number] Patient Account Number: [id number] Date of Birth/Sex: 07/16/1976 (42 y.o. F) Treating RN: Ahmed Prima Primary Care Provider: Suzan Garibaldi Other Clinician: Referring Provider: Suzan Garibaldi Treating Provider/Extender: Cathie Olden in Treatment: 15 Diagnosis Coding ICD-10 Codes Code Description E11.621 Type 2 diabetes mellitus with foot ulcer L97.521 Non-pressure chronic ulcer of other part of left foot limited to breakdown of skin Facility Procedures CPT4 Code Description: 13244010 11042 - DEB SUBQ TISSUE 20 SQ CM/< ICD-10 Diagnosis Description L97.521 Non-pressure chronic ulcer of other part of left foot limited t Modifier: o breakdown of s Quantity: 1 kin Physician Procedures CPT4 Code Description: [id number] - WC PHYS SUBQ TISS 20 SQ CM ICD-10 Diagnosis Description L97.521 Non-pressure chronic ulcer of other part of left foot limited t Modifier: o breakdown of sk Quantity: 1 in Electronic Signature(s) Signed: 05/03/2018 3:00:23 PM By: Lawanda Cousins Entered By: Lawanda Cousins on 05/03/2018 15:00:22

## 2018-05-10 ENCOUNTER — Encounter: Payer: Medicaid Other | Admitting: Nurse Practitioner

## 2018-05-10 DIAGNOSIS — E11621 Type 2 diabetes mellitus with foot ulcer: Secondary | ICD-10-CM | POA: Diagnosis not present

## 2018-05-11 NOTE — Progress Notes (Addendum)
[name], [name] ([id number]) Visit Report for 05/10/2018 Arrival Information Details Patient Name: [name], [name] Date of Service: 05/10/2018 1:00 PM Medical Record Number: [id number] Patient Account Number: [id number] Date of Birth/Sex: [birth year] (42 y.o. F) Treating RN: Montey Hora Primary Care Taneka Espiritu: Suzan Garibaldi Other Clinician: Referring Denesha Brouse: Suzan Garibaldi Treating Paul Trettin/Extender: Cathie Olden in Treatment: 46 Visit Information History Since Last Visit Added or deleted any medications: No Patient Arrived: Wheel Chair Any new allergies or adverse reactions: No Arrival Time: 13:30 Had a fall or experienced change in No Accompanied By: mother activities of daily living that may affect Transfer Assistance: None risk of falls: Patient Identification Verified: Yes Signs or symptoms of abuse/neglect since last visito No Secondary Verification Process Completed: Yes Hospitalized since last visit: No Patient Has Alerts: Yes Implantable device outside of the clinic excluding No Patient Alerts: DMII cellular tissue based products placed in the center since last visit: Has Dressing in Place as Prescribed: Yes Pain Present Now: No Electronic Signature(s) Signed: 05/10/2018 3:30:12 PM By: Montey Hora Entered By: Montey Hora on 05/10/2018 13:31:14 [name] ([id number]) -------------------------------------------------------------------------------- Encounter Discharge Information Details Patient Name: Heather Dear A. Date of Service: 05/10/2018 1:00 PM Medical Record Number: [id number] Patient Account Number: [id number] Date of Birth/Sex: 1976-05-04 (42 y.o. F) Treating RN: Roger Shelter Primary Care Odie Edmonds: Suzan Garibaldi Other Clinician: Referring Littie Chiem: Suzan Garibaldi Treating Orabelle Rylee/Extender: Cathie Olden in Treatment: 16 Encounter Discharge Information Items Discharge Condition: Stable Ambulatory Status:  Wheelchair Discharge Destination: Home Transportation: Private Auto Schedule Follow-up Appointment: Yes Clinical Summary of Care: Electronic Signature(s) Signed: 05/10/2018 3:39:18 PM By: Roger Shelter Entered By: Roger Shelter on 05/10/2018 13:54:15 [name], [name] ([id number]) -------------------------------------------------------------------------------- Lower Extremity Assessment Details Patient Name: Heather Dear A. Date of Service: 05/10/2018 1:00 PM Medical Record Number: [id number] Patient Account Number: [id number] Date of Birth/Sex: 03-12-1976 (42 y.o. F) Treating RN: Montey Hora Primary Care Velisa Regnier: Suzan Garibaldi Other Clinician: Referring Jarrett Chicoine: Suzan Garibaldi Treating Tollie Canada/Extender: Cathie Olden in Treatment: 16 Vascular Assessment Pulses: Dorsalis Pedis Palpable: [Left:Yes] Posterior Tibial Extremity colors, hair growth, and conditions: Extremity Color: [Left:Normal] Hair Growth on Extremity: [Left:Yes] Temperature of Extremity: [Left:Warm] Capillary Refill: [Left:< 3 seconds] Toe Nail Assessment Left: Right: Thick: Yes Discolored: No Deformed: No Improper Length and Hygiene: No Electronic Signature(s) Signed: 05/10/2018 3:30:12 PM By: Montey Hora Entered By: Montey Hora on 05/10/2018 13:38:08 [name] ([id number]) -------------------------------------------------------------------------------- Multi Wound Chart Details Patient Name: Heather Dear A. Date of Service: 05/10/2018 1:00 PM Medical Record Number: [id number] Patient Account Number: [id number] Date of Birth/Sex: [birth year] (42 y.o. F) Treating RN: Ahmed Prima Primary Care Ethel Veronica: Suzan Garibaldi Other Clinician: Referring David Towson: Suzan Garibaldi Treating Aliana Kreischer/Extender: Cathie Olden in Treatment: 16 Vital Signs Height(in): 18 Pulse(bpm): 34 Weight(lbs): 250 Blood Pressure(mmHg): 90/54 Body Mass Index(BMI): 37 Temperature(F):  98.3 Respiratory Rate 18 (breaths/min): Photos: [N/A:N/A] Wound Location: Left Toe Great N/A N/A Wounding Event: Gradually Appeared N/A N/A Primary Etiology: Diabetic Wound/Ulcer of the N/A N/A Lower Extremity Comorbid History: Chronic Obstructive N/A N/A Pulmonary Disease (COPD), Congestive Heart Failure, Hypertension, Type II Diabetes, Neuropathy, Seizure Disorder Date Acquired: 01/01/2018 N/A N/A Weeks of Treatment: 16 N/A N/A Wound Status: Open N/A N/A Pending Amputation on Yes N/A N/A Presentation: Measurements L x W x D 0.6x0.3x0.2 N/A N/A (cm) Area (cm) : 0.141 N/A N/A Volume (cm) : 0.028 N/A N/A % Reduction in Area: 98.10% N/A N/A % Reduction in Volume: 99.10% N/A N/A Classification: Grade 2 N/A N/A Exudate Amount: Small N/A N/A  Exudate Type: Serosanguineous N/A N/A Exudate Color: red, brown N/A N/A Wound Margin: Flat and Intact N/A N/A Granulation Amount: Large (67-100%) N/A N/A Granulation Quality: Red N/A N/A Necrotic Amount: Small (1-33%) N/A N/A [name], [name] ([id number]) Exposed Structures: Fat Layer (Subcutaneous N/A N/A Tissue) Exposed: Yes Fascia: No Tendon: No Muscle: No Joint: No Bone: No Epithelialization: None N/A N/A Debridement: Debridement - Excisional N/A N/A Pre-procedure 13:43 N/A N/A Verification/Time Out Taken: Pain Control: Lidocaine 4% Topical Solution N/A N/A Tissue Debrided: Subcutaneous, Slough N/A N/A Level: Skin/Subcutaneous Tissue N/A N/A Debridement Area (sq cm): 0.18 N/A N/A Instrument: Curette N/A N/A Bleeding: Minimum N/A N/A Hemostasis Achieved: Pressure N/A N/A Procedural Pain: 0 N/A N/A Post Procedural Pain: 0 N/A N/A Debridement Treatment Procedure was tolerated well N/A N/A Response: Post Debridement 0.6x0.3x0.3 N/A N/A Measurements L x W x D (cm) Post Debridement Volume: 0.042 N/A N/A (cm) Periwound Skin Texture: Callus: Yes N/A N/A Excoriation: No Induration: No Crepitus: No Rash: No Scarring:  No Periwound Skin Moisture: Maceration: Yes N/A N/A Dry/Scaly: No Periwound Skin Color: Erythema: Yes N/A N/A Atrophie Blanche: No Cyanosis: No Ecchymosis: No Hemosiderin Staining: No Mottled: No Pallor: No Rubor: No Erythema Location: Circumferential N/A N/A Temperature: No Abnormality N/A N/A Tenderness on Palpation: No N/A N/A Wound Preparation: Ulcer Cleansing: N/A N/A Rinsed/Irrigated with Saline Topical Anesthetic Applied: Other: lidocaine 4% Procedures Performed: Debridement N/A N/A Treatment Notes Wound #1 (Left Toe Great) 1. Cleansed with: Clean wound with Normal Saline [name], [name] ([id number]) 2. Anesthetic Topical Lidocaine 4% cream to wound bed prior to debridement 4. Dressing Applied: Hydrafera Blue 5. Secondary Dressing Applied Dry Gauze Notes conform wrap Electronic Signature(s) Signed: 05/10/2018 4:26:59 PM By: Lawanda Cousins Previous Signature: 05/10/2018 3:48:25 PM Version By: Alric Quan Entered By: Lawanda Cousins on 05/10/2018 16:26:59 [name] ([id number]) -------------------------------------------------------------------------------- [place] Details Patient Name: Heather Dear A. Date of Service: 05/10/2018 1:00 PM Medical Record Number: [id number] Patient Account Number: [id number] Date of Birth/Sex: 29-Dec-1975 (42 y.o. F) Treating RN: Ahmed Prima Primary Care Hamish Banks: Suzan Garibaldi Other Clinician: Referring Toniya Rozar: Suzan Garibaldi Treating Brannon Levene/Extender: Cathie Olden in Treatment: 16 Active Inactive ` Abuse / Safety / Falls / Self Care Management Nursing Diagnoses: Potential for falls Goals: Patient will not experience any injury related to falls Date Initiated: 01/18/2018 Target Resolution Date: 04/28/2018 Goal Status: Active Interventions: Assess fall risk on admission and as needed Assess: immobility, friction, shearing, incontinence upon admission and as needed Assess impairment  of mobility on admission and as needed per policy Assess personal safety and home safety (as indicated) on admission and as needed Assess self care needs on admission and as needed Notes: ` Nutrition Nursing Diagnoses: Imbalanced nutrition Impaired glucose control: actual or potential Potential for alteratiion in Nutrition/Potential for imbalanced nutrition Goals: Patient/caregiver agrees to and verbalizes understanding of need to use nutritional supplements and/or vitamins as prescribed Date Initiated: 01/18/2018 Target Resolution Date: 04/28/2018 Goal Status: Active Patient/caregiver will maintain therapeutic glucose control Date Initiated: 01/18/2018 Target Resolution Date: 04/28/2018 Goal Status: Active Interventions: Assess patient nutrition upon admission and as needed per policy Provide education on elevated blood sugars and impact on wound healing Notes: [name], [name] ([id number]) Orientation to the Wound Care Program Nursing Diagnoses: Knowledge deficit related to the wound healing center program Goals: Patient/caregiver will verbalize understanding of the [facility] Date Initiated: 01/18/2018 Target Resolution Date: 01/27/2018 Goal Status: Active Interventions: Provide education on orientation to the wound center Notes: ` Wound/Skin  Impairment Nursing Diagnoses: Impaired tissue integrity Knowledge deficit related to smoking impact on wound healing Knowledge deficit related to ulceration/compromised skin integrity Goals: Ulcer/skin breakdown will have a volume reduction of 80% by week 12 Date Initiated: 01/18/2018 Target Resolution Date: 04/21/2018 Goal Status: Active Interventions: Assess patient/caregiver ability to perform ulcer/skin care regimen upon admission and as needed Assess ulceration(s) every visit Notes: Electronic Signature(s) Signed: 05/10/2018 3:48:25 PM By: Alric Quan Entered By: Alric Quan on 05/10/2018  13:43:29 [name], [name] ([id number]) -------------------------------------------------------------------------------- Pain Assessment Details Patient Name: Heather Dear A. Date of Service: 05/10/2018 1:00 PM Medical Record Number: [id number] Patient Account Number: [id number] Date of Birth/Sex: 1976/01/18 (42 y.o. F) Treating RN: Montey Hora Primary Care Rula [place]: Suzan Garibaldi Other Clinician: Referring Damari Suastegui: Suzan Garibaldi Treating Luvada Salamone/Extender: Cathie Olden in Treatment: 16 Active Problems Location of Pain Severity and Description of Pain Patient Has Paino No Site Locations Pain Management and Medication Current Pain Management: Electronic Signature(s) Signed: 05/10/2018 3:30:12 PM By: Montey Hora Entered By: Montey Hora on 05/10/2018 13:31:20 [name] ([id number]) -------------------------------------------------------------------------------- Patient/Caregiver Education Details Patient Name: Heather Dear A. Date of Service: 05/10/2018 1:00 PM Medical Record Number: [id number] Patient Account Number: [id number] Date of Birth/Gender: 02/14/1976 (42 y.o. F) Treating RN: Roger Shelter Primary Care Physician: Suzan Garibaldi Other Clinician: Referring Physician: Suzan Garibaldi Treating Physician/Extender: Cathie Olden in Treatment: 16 Education Assessment Education Provided To: Patient Education Topics Provided Wound Debridement: Handouts: Wound Debridement Methods: Explain/Verbal Responses: State content correctly Wound/Skin Impairment: Handouts: Caring for Your Ulcer Methods: Explain/Verbal Responses: State content correctly Electronic Signature(s) Signed: 05/10/2018 3:39:18 PM By: Roger Shelter Entered By: Roger Shelter on 05/10/2018 13:54:31 [name], [name] ([id number]) -------------------------------------------------------------------------------- Wound Assessment Details Patient Name: Heather Dear A. Date of  Service: 05/10/2018 1:00 PM Medical Record Number: [id number] Patient Account Number: [id number] Date of Birth/Sex: Jan 11, 1976 (42 y.o. F) Treating RN: Montey Hora Primary Care Roann Merk: Suzan Garibaldi Other Clinician: Referring Ruthia Person: Suzan Garibaldi Treating Miyo Aina/Extender: Cathie Olden in Treatment: 16 Wound Status Wound Number: 1 Primary Diabetic Wound/Ulcer of the Lower Extremity Etiology: Wound Location: Left Toe Great Wound Open Wounding Event: Gradually Appeared Status: Date Acquired: 01/01/2018 Comorbid Chronic Obstructive Pulmonary Disease Weeks Of Treatment: 16 History: (COPD), Congestive Heart Failure, Clustered Wound: No Hypertension, Type II Diabetes, Neuropathy, Pending Amputation On Presentation Seizure Disorder Photos Photo Uploaded By: Montey Hora on 05/10/2018 15:11:58 Wound Measurements Length: (cm) 0.6 Width: (cm) 0.3 Depth: (cm) 0.2 Area: (cm) 0.141 Volume: (cm) 0.028 % Reduction in Area: 98.1% % Reduction in Volume: 99.1% Epithelialization: None Tunneling: No Undermining: No Wound Description Classification: Grade 2 Wound Margin: Flat and Intact Exudate Amount: Small Exudate Type: Serosanguineous Exudate Color: red, brown Foul Odor After Cleansing: No Slough/Fibrino Yes Wound Bed Granulation Amount: Large (67-100%) Exposed Structure Granulation Quality: Red Fascia Exposed: No Necrotic Amount: Small (1-33%) Fat Layer (Subcutaneous Tissue) Exposed: Yes Necrotic Quality: Adherent Slough Tendon Exposed: No Muscle Exposed: No Joint Exposed: No Bone Exposed: No [name], [name] ([id number]) Periwound Skin Texture Texture Color No Abnormalities Noted: No No Abnormalities Noted: No Callus: Yes Atrophie Blanche: No Crepitus: No Cyanosis: No Excoriation: No Ecchymosis: No Induration: No Erythema: Yes Rash: No Erythema Location: Circumferential Scarring: No Hemosiderin Staining: No Mottled: No Moisture Pallor:  No No Abnormalities Noted: No Rubor: No Dry / Scaly: No Maceration: Yes Temperature / Pain Temperature: No Abnormality Wound Preparation Ulcer Cleansing: Rinsed/Irrigated with Saline Topical Anesthetic Applied: Other: lidocaine 4%, Treatment Notes Wound #1 (Left Toe Great) 1. Cleansed with: Clean wound with  Normal Saline 2. Anesthetic Topical Lidocaine 4% cream to wound bed prior to debridement 4. Dressing Applied: Hydrafera Blue 5. Secondary Dressing Applied Dry Gauze Notes conform wrap Electronic Signature(s) Signed: 05/10/2018 3:30:12 PM By: Montey Hora Entered By: Montey Hora on 05/10/2018 13:37:43 [name] ([id number]) -------------------------------------------------------------------------------- Vitals Details Patient Name: Heather Dear A. Date of Service: 05/10/2018 1:00 PM Medical Record Number: [id number] Patient Account Number: [id number] Date of Birth/Sex: June 22, 1976 (42 y.o. F) Treating RN: Montey Hora Primary Care Jacyln Carmer: Suzan Garibaldi Other Clinician: Referring Danisa Kopec: Suzan Garibaldi Treating Hamid Brookens/Extender: Cathie Olden in Treatment: 16 Vital Signs Time Taken: 13:32 Temperature (F): 98.3 Height (in): 69 Pulse (bpm): 82 Weight (lbs): 250 Respiratory Rate (breaths/min): 18 Body Mass Index (BMI): 36.9 Blood Pressure (mmHg): 90/54 Reference Range: 80 - 120 mg / dl Electronic Signature(s) Signed: 05/10/2018 3:30:12 PM By: Montey Hora Entered By: Montey Hora on 05/10/2018 13:35:33

## 2018-05-15 NOTE — Progress Notes (Signed)
JUNELL, CULLIFER ([id number]) Visit Report for 05/10/2018 Chief Complaint Document Details Patient Name: [name], [name] A. Date of Service: 05/10/2018 1:00 PM Medical Record Number: [id number] Patient Account Number: [id number] Date of Birth/Sex: 20-Jul-1976 (42 y.o. F) Treating RN: Ahmed Prima Primary Care Provider: Suzan Garibaldi Other Clinician: Referring Provider: Suzan Garibaldi Treating Provider/Extender: Cathie Olden in Treatment: 16 Information Obtained from: Patient Chief Complaint She is here for evaluation of a left great toe ulcer Electronic Signature(s) Signed: 05/10/2018 4:27:12 PM By: Lawanda Cousins Entered By: Lawanda Cousins on 05/10/2018 16:27:11 [place], [place] ([id number]) -------------------------------------------------------------------------------- Debridement Details Patient Name: Heather Dear A. Date of Service: 05/10/2018 1:00 PM Medical Record Number: [id number] Patient Account Number: [id number] Date of Birth/Sex: 11/21/1975 (43 y.o. F) Treating RN: Ahmed Prima Primary Care Provider: Suzan Garibaldi Other Clinician: Referring Provider: Suzan Garibaldi Treating Provider/Extender: Cathie Olden in Treatment: 16 Debridement Performed for Wound #1 Left Toe Great Assessment: Performed By: Physician Lawanda Cousins, NP Debridement Type: Debridement Severity of Tissue Pre Fat layer exposed Debridement: Pre-procedure Verification/Time Yes - 13:43 Out Taken: Start Time: 13:43 Pain Control: Lidocaine 4% Topical Solution Total Area Debrided (L x W): 0.6 (cm) x 0.3 (cm) = 0.18 (cm) Tissue and other material Viable, Non-Viable, Slough, Subcutaneous, Fibrin/Exudate, Slough debrided: Level: Skin/Subcutaneous Tissue Debridement Description: Excisional Instrument: Curette Bleeding: Minimum Hemostasis Achieved: Pressure End Time: 13:45 Procedural Pain: 0 Post Procedural Pain: 0 Response to Treatment: Procedure was tolerated well Level of  Consciousness: Awake and Alert Post Debridement Measurements of Total Wound Length: (cm) 0.6 Width: (cm) 0.3 Depth: (cm) 0.3 Volume: (cm) 0.042 Character of Wound/Ulcer Post Debridement: Requires Further Debridement Severity of Tissue Post Debridement: Fat layer exposed Post Procedure Diagnosis Same as Pre-procedure Electronic Signature(s) Signed: 05/10/2018 3:48:25 PM By: Alric Quan Signed: 05/10/2018 4:35:32 PM By: Lawanda Cousins Entered By: Alric Quan on 05/10/2018 13:46:37 [place], [place] ([id number]) -------------------------------------------------------------------------------- HPI Details Patient Name: Heather Dear A. Date of Service: 05/10/2018 1:00 PM Medical Record Number: [id number] Patient Account Number: [id number] Date of Birth/Sex: 1975/11/28 (42 y.o. F) Treating RN: Ahmed Prima Primary Care Provider: Suzan Garibaldi Other Clinician: Referring Provider: Suzan Garibaldi Treating Provider/Extender: Cathie Olden in Treatment: 16 History of Present Illness HPI Description: 01/18/18-She is here for initial evaluation of the left great toe ulcer. She is a poor historian in regards to timeframe in detail. She states approximately 4 weeks ago she lacerated her toe on something in the house. She followed up with her primary care who placed her on Bactrim and ultimately a second dose of Bactrim prior to coming to wound clinic. She states she has been treating the toe with peroxide, Betadine and a Band-Aid. She did not check her blood sugar this morning but checked it yesterday morning it was 327; she is unaware of a recent A1c and there are no current records. She saw Dr. she would've orthopedics last week for an old injury to the left ankle, she states he did not see her toe, nor did she bring it to his attention. She smokes approximately 1 pack cigarettes a day. Her social situation is concerning, she arrives this morning with her mother who appears  extremely intoxicated/under the influence; her mother was asked to leave the room and be monitored by the patient's grandmother. The patient's aunt then accompanied the patient and the room throughout the rest of the appointment. We had a lengthy discussion regarding the deleterious effects of uncontrolled hyperglycemia and smoking as it relates to wound healing and overall health. She  was strongly encouraged to decrease her smoking and get her diabetes under better control. She states she is currently on a diet and has cut down her [facility] consumption. The left toe is erythematous, macerated and slightly edematous with malodor present. The edema in her left foot is below her baseline, there is no erythema streaking. We will treat her with Santyl, doxycycline; we have ordered and xray, culture and provided a Peg assist surgical shoe and cultured the wound. 01/25/18-She is here in follow-up evaluation for a left great toe ulcer and presents with an abscess to her suprapubic area. She states her blood sugars remain elevated, feeling "sick" and if levels are below 250, but she is trying. She has made no attempt to decrease her smoking stating that we "can't take away her food in her cigarettes". She has been compliant with offloading using the PEG assist you. She is using Santyl daily. the culture obtained last week grew staph aureus and Enterococcus faecalis; continues on the doxycycline and Augmentin was added on Monday. The suprapubic area has erythema, no femoral variation, purple discoloration, minimal induration, was accessed with a cotton tip applicator with sanguinopurulent drainage, this was cultured, I suspect the current antibiotic treatment will cover and we will not add anything to her current treatment plan. She was advised to go to urgent care or ER with any change in redness, induration or fever. 02/01/18-She is here in follow-up evaluation for left great toe ulcers and a new abdominal  abscess from last week. She was able to use packing until earlier this week, where she "forgot it was there". She states she was feeling ill with GI symptoms last week and was not taking her antibiotic. She states her glucose levels have been predominantly less than 200, with occasional levels between 200-250. She thinks this was contributing to her GI symptoms as they have resolved without intervention. There continues to be significant laceration to left toe, otherwise it clinically looks stable/improved. There is now less superficial opening to the lateral aspect of the great toe that was residual blister. We will transition to [place] to all wounds, she will continue her Augmentin. If there is no change or deterioration next week for reculture. 02/08/18-She is here in follow-up evaluation for left great toe ulcer and abdominal ulcer. There is an improvement in both wounds. She has been wrapping her left toe with coban, not by our direction, which has created an area of discoloration to the medial aspect; she has been advised to NOT use coban secondary to her neuropathy. She states her glucose levels have been high over this last week ranging from 200-350, she continues to smoke. She admits to being less compliant with her offloading shoe. We will continue with same treatment plan and she will follow-up next week. 02/15/18-She is here in follow-up evaluation for left great toe ulcer and abdominal ulcer. The abdominal ulcer is epithelialized. The left great toe ulcer is improved and all injury from last week using the Coban wrap is resolved, the lateral ulcer is healed. She admits to noncompliance with wearing offloading shoe and admits to glucose levels being greater than 300 most of the week. She continues to smoke and expresses no desire to quit. There is one area medially that probes deeper than it has historically, erythema to the toe and dorsal foot has consistently waxed and waned.  There is no overt signs of cellulitis or infection but we will culture the wound for any occult infection given the  new area of depth and erythema. We will hold off on sensitivities for initiation of antibiotic therapy. 02/22/18-She is here in follow up evaluation for left great toe ulcer. There is overall significant improvement in both wound appearance, erythema and edema with changes made last week. She was not initiated on antibiotic therapy. Culture obtained last week showed oxacillin sensitive staph aureus, sensitive to clindamycin. Clindamycin has been called into the pharmacy but she has been instructed to hold off on initiation secondary to overall clinical improvement and her history of antibiotic intolerance. She has been instructed to contact the clinic with any noted changes/deterioration and the wound, erythema, Bencomo, Ayjah A. ([id number]) edema and/or pain. She will follow-up next week. She continues to smoke and her glucose levels remain elevated >250; she admits to compliance with offloading shoe 03/01/18 on evaluation today patient appears to be doing fairly well in regard to her left first toe ulcer. She has been tolerating the dressing changes with the [facility] Dressing without complication and overall this has definitely showed signs of improvement according to records as well is what the patient tells me today. I'm very pleased in that regard. She is having no pain today 03/08/18 She is here for follow up evaluation of a left great toe ulcer. She remains non-compliant with glucose control and smoking cessation; glucose levels consistently >200. She states that she got new shoe inserts/peg assist. She admits to compliance with offloading. Since my last evaluation there is significant improvement. We will switch to prisma at this time and she will follow up next week. She is noted to be tachycardic at this appointment, heart rate 120s; she has a history of heart rate 70-130  according to our records. She admits to extreme agitation r/t personal issues; she was advised to monitor her heartrate and contact her physician if it does not return to a more normal range (<100). She takes cardizem twice daily. 03/15/18-She is here in follow-up evaluation for left great toe ulcer. She remains noncompliant with glucose control and smoking cessation. She admits to compliance with wearing offloading shoe. The ulcer is improved/stable and we will continue with the same treatment plan and she will follow-up next week 03/22/18-She is here for evaluation for left great toe ulcer. There continues to be significant improvement despite recurrent hyperglycemia (over 500 yesterday) and she continues to smoke. She has been compliant with offloading and we will continue with same treatment plan and she will follow-up next week. 03/29/18-She is here for evaluation for left great toe ulcer. Despite continuing to smoke and uncontrolled diabetes she continues to improve. She is compliant with offloading shoe. We will continue with the same treatment plan and she will follow-up next week 04/05/18- She is here in follow up evaluation for a left great toe ulcer; she presents with small pustule to left fifth toe (resembles ant bite). She admits to compliance with wearing offloading shoe; continues to smoke or have uncontrolled blood glucose control. There is more callus than usual with evidence of bleeding; she denies known trauma. 04/12/18-She is here for evaluation of left great toe ulcer. Despite noncompliance with glycemic control and smoking she continues to make improvement. She continues to wear offloading shoe. The pustule, that was identified last week, to the left fifth toe is resolved. She will follow-up in 2 weeks 05/03/18-she is seen in follow-up evaluation for a left great toe ulcer. She is compliant with offloading, otherwise noncompliant with glycemic control and smoking. She has plateaued  and there  is minimal improvement noted. We will transition to [facility], replaced the insert to her surgical shoe and she will follow-up in one week 05/10/18- She is here in follow up evaluation for a left great toe ulcer. It appears stable despite measurement change. We will continue with same treatment plan and follow up next week. Electronic Signature(s) Signed: 05/10/2018 4:29:37 PM By: Lawanda Cousins Entered By: Lawanda Cousins on 05/10/2018 16:29:37 [name] ([id number]) -------------------------------------------------------------------------------- Physician Orders Details Patient Name: Heather Dear A. Date of Service: 05/10/2018 1:00 PM Medical Record Number: [id number] Patient Account Number: [id number] Date of Birth/Sex: Apr 13, 1976 (42 y.o. F) Treating RN: Ahmed Prima Primary Care Provider: Suzan Garibaldi Other Clinician: Referring Provider: Suzan Garibaldi Treating Provider/Extender: Cathie Olden in Treatment: 16 Verbal / Phone Orders: Yes Clinician: Carolyne Fiscal, Debi Read Back and Verified: Yes Diagnosis Coding Wound Cleansing Wound #1 Left Toe Great o Clean wound with Normal Saline. o Cleanse wound with mild soap and water o May Shower, gently pat wound dry prior to applying new dressing. Anesthetic (add to Medication List) Wound #1 Left Toe Great o Topical Lidocaine 4% cream applied to wound bed prior to debridement (In Clinic Only). Primary Wound Dressing Wound #1 Left Toe Great o Hydrafera Blue Ready Transfer Secondary Dressing Wound #1 Left Toe Great o Dry Gauze o Conform/Kerlix Dressing Change Frequency Wound #1 Left Toe Great o Change dressing every other day. o Other: - as needed Follow-up Appointments Wound #1 Left Toe Great o Return Appointment in 1 week. Edema Control Wound #1 Left Toe Great o Elevate legs to the level of the heart and pump ankles as often as possible Off-Loading Wound #1 Left Toe Great o  Open toe surgical shoe with peg assist. Additional Orders / Instructions Wound #1 Left Toe Great o Stop Smoking o Increase protein intake. o Other: - use Betadine on the left 5th toe Cammarano, Kaymarie A. ([id number]) Patient Medications Allergies: erythromycin base Notifications Medication Indication Start End lidocaine DOSE 1 - topical 4 % cream - 1 cream topical Electronic Signature(s) Signed: 05/10/2018 3:48:25 PM By: Alric Quan Signed: 05/10/2018 4:35:32 PM By: Lawanda Cousins Entered By: Alric Quan on 05/10/2018 13:47:10 ROZELLA, SERVELLO A. ([id number]) -------------------------------------------------------------------------------- Prescription 05/10/2018 Patient Name: Heather Dear A. Provider: Lawanda Cousins NP Date of Birth: 29-Mar-1976 NPI#: [id number] Sex: F DEA#: [id number] Phone #: [phone] License #: Patient Address: [facility] [place], [place] [address] [address], [place], [place] [address] [phone] Allergies erythromycin base Medication Medication: Route: Strength: Form: lidocaine topical 4% cream Class: TOPICAL LOCAL ANESTHETICS Dose: Frequency / Time: Indication: 1 1 cream topical Number of Refills: Number of Units: 0 Generic Substitution: Start Date: End Date: Administered at [place]: Yes Time Administered: Time Discontinued: Note to Pharmacy: Signature(s): Date(s): Electronic Signature(s) Signed: 05/10/2018 3:48:25 PM By: Alric Quan Signed: 05/10/2018 4:35:32 PM By: Lawanda Cousins Entered By: Alric Quan on 05/10/2018 13:47:11 [name] ([id number]) [name], Ceri A. ([id number]) --------------------------------------------------------------------------------  Problem List Details Patient Name: Heather Dear A. Date of Service: 05/10/2018 1:00 PM Medical Record Number: [id number] Patient  Account Number: [id number] Date of Birth/Sex: 1976/04/21 (42 y.o. 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 Date of Service: 05/10/2018 1:00 PM Medical Record Number: [id number] Patient Account Number: [id number] Date of Birth/Sex: 03-16-1976 (42 y.o. F) Treating RN: Ahmed Prima Primary Care Provider: Suzan Garibaldi Other Clinician: Referring Provider: Suzan Garibaldi Treating Provider/Extender: Cathie Olden in Treatment: 16 Subjective Chief Complaint Information obtained from Patient She is here for evaluation of a left great toe ulcer History of Present Illness (HPI) 01/18/18-She is here for initial evaluation of the left great toe ulcer. She is a poor historian in regards to timeframe in detail. She states approximately 4 weeks ago she lacerated her toe on something in the house. She followed up with her primary care who placed her on Bactrim and ultimately a second dose of Bactrim prior to coming to wound clinic. She states she has been treating  the toe with peroxide, Betadine and a Band-Aid. She did not check her blood sugar this morning but checked it yesterday morning it was 327; she is unaware of a recent A1c and there are no current records. She saw Dr. she would've orthopedics last week for an old injury to the left ankle, she states he did not see her toe, nor did she bring it to his attention. She smokes approximately 1 pack cigarettes a day. Her social situation is concerning, she arrives this morning with her mother who appears extremely intoxicated/under the influence; her mother was asked to leave the room and be monitored by the patient's grandmother. The patient's aunt then accompanied the patient and the room throughout the rest of the appointment. We had a lengthy discussion regarding the deleterious effects of uncontrolled hyperglycemia and smoking as it relates to wound healing and overall health. She was strongly encouraged to decrease her smoking and get her diabetes under better control. She states she is currently on a diet and has cut down her [facility] consumption. The left toe is erythematous, macerated and slightly edematous with malodor present. The edema in her left foot is below her baseline, there is no erythema streaking. We will treat her with Santyl, doxycycline; we have ordered and xray, culture and provided a Peg assist surgical shoe and cultured the wound. 01/25/18-She is here in follow-up evaluation for a left great toe ulcer and presents with an abscess to her suprapubic area. She states her blood sugars remain elevated, feeling "sick" and if levels are below 250, but she is trying. She has made no attempt to decrease her smoking stating that we "can't take away her food in her cigarettes". She has been compliant with offloading using the PEG assist you. She is using Santyl daily. the culture obtained last week grew staph aureus and Enterococcus faecalis; continues on the doxycycline and Augmentin was  added on Monday. The suprapubic area has erythema, no femoral variation, purple discoloration, minimal induration, was accessed with a cotton tip applicator with sanguinopurulent drainage, this was cultured, I suspect the current antibiotic treatment will cover and we will not add anything to her current treatment plan. She was advised to go to urgent care or ER with any change in redness, induration  or fever. 02/01/18-She is here in follow-up evaluation for left great toe ulcers and a new abdominal abscess from last week. She was able to use packing until earlier this week, where she "forgot it was there". She states she was feeling ill with GI symptoms last week and was not taking her antibiotic. She states her glucose levels have been predominantly less than 200, with occasional levels between 200-250. She thinks this was contributing to her GI symptoms as they have resolved without intervention. There continues to be significant laceration to left toe, otherwise it clinically looks stable/improved. There is now less superficial opening to the lateral aspect of the great toe that was residual blister. We will transition to [facility] to all wounds, she will continue her Augmentin. If there is no change or deterioration next week for reculture. 02/08/18-She is here in follow-up evaluation for left great toe ulcer and abdominal ulcer. There is an improvement in both wounds. She has been wrapping her left toe with coban, not by our direction, which has created an area of discoloration to the medial aspect; she has been advised to NOT use coban secondary to her neuropathy. She states her glucose levels have been high over this last week ranging from 200-350, she continues to smoke. She admits to being less compliant with her offloading shoe. We will continue with same treatment plan and she will follow-up next week. 02/15/18-She is here in follow-up evaluation for left great toe ulcer and abdominal  ulcer. The abdominal ulcer is epithelialized. The left great toe ulcer is improved and all injury from last week using the Coban wrap is resolved, the lateral ulcer is healed. She admits to noncompliance with wearing offloading shoe and admits to glucose levels being greater than 300 most of the week. She continues to smoke and expresses no desire to quit. There is one area medially that probes deeper than it has historically, erythema to the toe and dorsal foot has consistently waxed and waned. There is no overt signs of cellulitis or infection but we will culture the wound for any occult infection given the new area of depth and erythema. We will hold off on Domke, Jaretzy A. ([id number]) sensitivities for initiation of antibiotic therapy. 02/22/18-She is here in follow up evaluation for left great toe ulcer. There is overall significant improvement in both wound appearance, erythema and edema with changes made last week. She was not initiated on antibiotic therapy. Culture obtained last week showed oxacillin sensitive staph aureus, sensitive to clindamycin. Clindamycin has been called into the pharmacy but she has been instructed to hold off on initiation secondary to overall clinical improvement and her history of antibiotic intolerance. She has been instructed to contact the clinic with any noted changes/deterioration and the wound, erythema, edema and/or pain. She will follow-up next week. She continues to smoke and her glucose levels remain elevated >250; she admits to compliance with offloading shoe 03/01/18 on evaluation today patient appears to be doing fairly well in regard to her left first toe ulcer. She has been tolerating the dressing changes with the [facility] Dressing without complication and overall this has definitely showed signs of improvement according to records as well is what the patient tells me today. I'm very pleased in that regard. She is having no pain today 03/08/18  She is here for follow up evaluation of a left great toe ulcer. She remains non-compliant with glucose control and smoking cessation; glucose levels consistently >200. She states that she got new  shoe inserts/peg assist. She admits to compliance with offloading. Since my last evaluation there is significant improvement. We will switch to prisma at this time and she will follow up next week. She is noted to be tachycardic at this appointment, heart rate 120s; she has a history of heart rate 70-130 according to our records. She admits to extreme agitation r/t personal issues; she was advised to monitor her heartrate and contact her physician if it does not return to a more normal range (<100). She takes cardizem twice daily. 03/15/18-She is here in follow-up evaluation for left great toe ulcer. She remains noncompliant with glucose control and smoking cessation. She admits to compliance with wearing offloading shoe. The ulcer is improved/stable and we will continue with the same treatment plan and she will follow-up next week 03/22/18-She is here for evaluation for left great toe ulcer. There continues to be significant improvement despite recurrent hyperglycemia (over 500 yesterday) and she continues to smoke. She has been compliant with offloading and we will continue with same treatment plan and she will follow-up next week. 03/29/18-She is here for evaluation for left great toe ulcer. Despite continuing to smoke and uncontrolled diabetes she continues to improve. She is compliant with offloading shoe. We will continue with the same treatment plan and she will follow-up next week 04/05/18- She is here in follow up evaluation for a left great toe ulcer; she presents with small pustule to left fifth toe (resembles ant bite). She admits to compliance with wearing offloading shoe; continues to smoke or have uncontrolled blood glucose control. There is more callus than usual with evidence of bleeding; she  denies known trauma. 04/12/18-She is here for evaluation of left great toe ulcer. Despite noncompliance with glycemic control and smoking she continues to make improvement. She continues to wear offloading shoe. The pustule, that was identified last week, to the left fifth toe is resolved. She will follow-up in 2 weeks 05/03/18-she is seen in follow-up evaluation for a left great toe ulcer. She is compliant with offloading, otherwise noncompliant with glycemic control and smoking. She has plateaued and there is minimal improvement noted. We will transition to [facility], replaced the insert to her surgical shoe and she will follow-up in one week 05/10/18- She is here in follow up evaluation for a left great toe ulcer. It appears stable despite measurement change. We will continue with same treatment plan and follow up next week. Patient History Information obtained from Patient. Family History Cancer - Paternal Grandparents, Heart Disease - Paternal Grandparents, Hypertension - Paternal Grandparents, No family history of Diabetes, Hereditary Spherocytosis, Kidney Disease, Lung Disease, Seizures, Stroke, Thyroid Problems, Tuberculosis. Social History Current every day smoker, Marital Status - Single, Alcohol Use - Never, Drug Use - Prior History, Caffeine Use - Daily. SHONDRIKA, HOQUE A. ([id number]) Objective Constitutional Vitals Time Taken: 1:32 PM, Height: 69 in, Weight: 250 lbs, BMI: 36.9, Temperature: 98.3 F, Pulse: 82 bpm, Respiratory Rate: 18 breaths/min, Blood Pressure: 90/54 mmHg. Integumentary (Hair, Skin) Wound #1 status is Open. Original cause of wound was Gradually Appeared. The wound is located on the Left Toe Great. The wound measures 0.6cm length x 0.3cm width x 0.2cm depth; 0.141cm^2 area and 0.028cm^3 volume. There is Fat Layer (Subcutaneous Tissue) Exposed exposed. There is no tunneling or undermining noted. There is a small amount of serosanguineous drainage noted. The  wound margin is flat and intact. There is large (67-100%) red granulation within the wound bed. There is a small (1-33%) amount of necrotic  tissue within the wound bed including Adherent Slough. The periwound skin appearance exhibited: Callus, Maceration, Erythema. The periwound skin appearance did not exhibit: Crepitus, Excoriation, Induration, Rash, Scarring, Dry/Scaly, Atrophie Blanche, Cyanosis, Ecchymosis, Hemosiderin Staining, Mottled, Pallor, Rubor. The surrounding wound skin color is noted with erythema which is circumferential. Periwound temperature was noted as No Abnormality. Assessment Active Problems ICD-10 Type 2 diabetes mellitus with foot ulcer Non-pressure chronic ulcer of other part of left foot limited to breakdown of skin Procedures Wound #1 Pre-procedure diagnosis of Wound #1 is a Diabetic Wound/Ulcer of the Lower Extremity located on the Left Toe Great .Severity of Tissue Pre Debridement is: Fat layer exposed. There was a Excisional Skin/Subcutaneous Tissue Debridement with a total area of 0.18 sq cm performed by Lawanda Cousins, NP. With the following instrument(s): Curette to remove Viable and Non-Viable tissue/material. Material removed includes Subcutaneous Tissue, Slough, and Fibrin/Exudate after achieving pain control using Lidocaine 4% Topical Solution. No specimens were taken. A time out was conducted at 13:43, prior to the start of the procedure. A Minimum amount of bleeding was controlled with Pressure. The procedure was tolerated well with a pain level of 0 throughout and a pain level of 0 following the procedure. Patient s Level of Consciousness post procedure was recorded as Awake and Alert. Post Debridement Measurements: 0.6cm length x 0.3cm width x 0.3cm depth; 0.042cm^3 volume. Character of Wound/Ulcer Post Debridement requires further debridement. Severity of Tissue Post Debridement is: Fat layer exposed. Post procedure Diagnosis Wound #1: Same as  Pre-Procedure Roussel, Champayne A. ([id number]) Plan Wound Cleansing: Wound #1 Left Toe Great: Clean wound with Normal Saline. Cleanse wound with mild soap and water May Shower, gently pat wound dry prior to applying new dressing. Anesthetic (add to Medication List): Wound #1 Left Toe Great: Topical Lidocaine 4% cream applied to wound bed prior to debridement (In Clinic Only). Primary Wound Dressing: Wound #1 Left Toe Great: Hydrafera Blue Ready Transfer Secondary Dressing: Wound #1 Left Toe Great: Dry Gauze Conform/Kerlix Dressing Change Frequency: Wound #1 Left Toe Great: Change dressing every other day. Other: - as needed Follow-up Appointments: Wound #1 Left Toe Great: Return Appointment in 1 week. Edema Control: Wound #1 Left Toe Great: Elevate legs to the level of the heart and pump ankles as often as possible Off-Loading: Wound #1 Left Toe Great: Open toe surgical shoe with peg assist. Additional Orders / Instructions: Wound #1 Left Toe Great: Stop Smoking Increase protein intake. Other: - use Betadine on the left 5th toe The following medication(s) was prescribed: lidocaine topical 4 % cream 1 1 cream topical was prescribed at facility Electronic Signature(s) Signed: 05/10/2018 4:30:01 PM By: Lawanda Cousins Entered By: Lawanda Cousins on 05/10/2018 [place], [place] ([id number]) -------------------------------------------------------------------------------- ROS/PFSH Details Patient Name: Heather Dear A. Date of Service: 05/10/2018 1:00 PM Medical Record Number: [id number] Patient Account Number: [id number] Date of Birth/Sex: 19-Nov-1975 (42 y.o. F) Treating RN: Ahmed Prima Primary Care Provider: Suzan Garibaldi Other Clinician: Referring Provider: Suzan Garibaldi Treating Provider/Extender: Cathie Olden in Treatment: 16 Information Obtained From Patient Wound History Do you currently have one or more open woundso No Have you tested positive for  osteomyelitis (bone infection)o No Have you had any tests for circulation on your legso No Hematologic/Lymphatic Medical History: Negative for: Anemia; Hemophilia; Human Immunodeficiency Virus; Lymphedema; Sickle Cell Disease Respiratory Medical History: Positive for: Chronic Obstructive Pulmonary Disease (COPD) Negative for: Aspiration; Asthma; Pneumothorax; Sleep Apnea; Tuberculosis Cardiovascular Medical History: Positive for: Congestive Heart Failure; Hypertension Negative for: Angina;  Arrhythmia; Coronary Artery Disease; Deep Vein Thrombosis; Hypotension; Myocardial Infarction; Peripheral Arterial Disease; Peripheral Venous Disease; Phlebitis; Vasculitis Gastrointestinal Medical History: Negative for: Cirrhosis ; Colitis; Crohnos; Hepatitis A; Hepatitis B; Hepatitis C Endocrine Medical History: Positive for: Type II Diabetes Treated with: Insulin, Oral agents Genitourinary Medical History: Negative for: End Stage Renal Disease Immunological Medical History: Negative for: Lupus Erythematosus; Raynaudos; Scleroderma Musculoskeletal Knipp, Aniza A. ([id number]) Medical History: Negative for: Gout; Rheumatoid Arthritis; Osteoarthritis; Osteomyelitis Neurologic Medical History: Positive for: Neuropathy; Seizure Disorder Oncologic Medical History: Negative for: Received Chemotherapy; Received Radiation Immunizations Pneumococcal Vaccine: Received Pneumococcal Vaccination: Yes Immunization Notes: up to date Implantable Devices Family and Social History Cancer: Yes - Paternal Grandparents; Diabetes: No; Heart Disease: Yes - Paternal Grandparents; Hereditary Spherocytosis: No; Hypertension: Yes - Paternal Grandparents; Kidney Disease: No; Lung Disease: No; Seizures: No; Stroke: No; Thyroid Problems: No; Tuberculosis: No; Current every day smoker; Marital Status - Single; Alcohol Use: Never; Drug Use: Prior History; Caffeine Use: Daily; Financial Concerns: No; Food,  Clothing or Shelter Needs: No; Support System Lacking: No; Transportation Concerns: No; Advanced Directives: No; Patient does not want information on Advanced Directives Physician Affirmation I have reviewed and agree with the above information. Electronic Signature(s) Signed: 05/10/2018 4:35:32 PM By: Lawanda Cousins Signed: 05/14/2018 5:20:31 PM By: Alric Quan Entered By: Lawanda Cousins on 05/10/2018 16:29:46 [name] ([id number]) -------------------------------------------------------------------------------- [place] Details Patient Name: Heather Dear A. Date of Service: 05/10/2018 Medical Record Number: [id number] Patient Account Number: [id number] Date of Birth/Sex: [birth year] (42 y.o. F) Treating RN: Ahmed Prima Primary Care Provider: Suzan Garibaldi Other Clinician: Referring Provider: Suzan Garibaldi Treating Provider/Extender: Cathie Olden in Treatment: 16 Diagnosis Coding ICD-10 Codes Code Description E11.621 Type 2 diabetes mellitus with foot ulcer L97.521 Non-pressure chronic ulcer of other part of left foot limited to breakdown of skin Facility Procedures CPT4 Code Description: [id number] - DEB SUBQ TISSUE 20 SQ CM/< ICD-10 Diagnosis Description L97.521 Non-pressure chronic ulcer of other part of left foot limited t E11.621 Type 2 diabetes mellitus with foot ulcer Modifier: o breakdown of s Quantity: 1 kin Physician Procedures CPT4 Code Description: [id number] - WC PHYS SUBQ TISS 20 SQ CM ICD-10 Diagnosis Description L97.521 Non-pressure chronic ulcer of other part of left foot limited t E11.621 Type 2 diabetes mellitus with foot ulcer Modifier: o breakdown of sk Quantity: 1 in Electronic Signature(s) Signed: 05/10/2018 4:30:22 PM By: Lawanda Cousins Entered By: Lawanda Cousins on 05/10/2018 16:30:22

## 2018-05-17 ENCOUNTER — Ambulatory Visit: Payer: Self-pay | Admitting: Nurse Practitioner

## 2018-05-24 ENCOUNTER — Encounter: Payer: Medicaid Other | Attending: Nurse Practitioner | Admitting: Nurse Practitioner

## 2018-05-24 DIAGNOSIS — G40909 Epilepsy, unspecified, not intractable, without status epilepticus: Secondary | ICD-10-CM | POA: Diagnosis not present

## 2018-05-24 DIAGNOSIS — I110 Hypertensive heart disease with heart failure: Secondary | ICD-10-CM | POA: Insufficient documentation

## 2018-05-24 DIAGNOSIS — I509 Heart failure, unspecified: Secondary | ICD-10-CM | POA: Diagnosis not present

## 2018-05-24 DIAGNOSIS — E1140 Type 2 diabetes mellitus with diabetic neuropathy, unspecified: Secondary | ICD-10-CM | POA: Diagnosis not present

## 2018-05-24 DIAGNOSIS — J449 Chronic obstructive pulmonary disease, unspecified: Secondary | ICD-10-CM | POA: Insufficient documentation

## 2018-05-24 DIAGNOSIS — L97521 Non-pressure chronic ulcer of other part of left foot limited to breakdown of skin: Secondary | ICD-10-CM | POA: Diagnosis not present

## 2018-05-24 DIAGNOSIS — E11621 Type 2 diabetes mellitus with foot ulcer: Secondary | ICD-10-CM | POA: Insufficient documentation

## 2018-05-31 ENCOUNTER — Encounter: Payer: Medicaid Other | Admitting: Nurse Practitioner

## 2018-05-31 DIAGNOSIS — E11621 Type 2 diabetes mellitus with foot ulcer: Secondary | ICD-10-CM | POA: Diagnosis not present

## 2018-06-06 NOTE — Progress Notes (Signed)
JOIE, HIPPS ([id number]) Visit Report for 05/24/2018 Arrival Information Details Patient Name: [name], [name] Date of Service: 05/24/2018 2:15 PM Medical Record Number: [id number] Patient Account Number: [id number] Date of Birth/Sex: 1976-01-05 (42 y.o. F) Treating RN: Ahmed Prima Primary Care Breonna Gafford: Suzan Garibaldi Other Clinician: Referring Gretna Bergin: Suzan Garibaldi Treating Oasis Goehring/Extender: Cathie Olden in Treatment: 18 Visit Information History Since Last Visit All ordered tests and consults were completed: No Patient Arrived: Wheel Chair Added or deleted any medications: No Arrival Time: 14:49 Any new allergies or adverse reactions: No Accompanied By: mother Had a fall or experienced change in No Transfer Assistance: EasyP[employer] Patient activities of daily living that may affect Lift risk of falls: Patient Identification Verified: Yes Signs or symptoms of abuse/neglect since last No Secondary Verification Process Yes visito Completed: Hospitalized since last visit: No Patient Requires Transmission-Based No Implantable device outside of the clinic No Precautions: excluding Patient Has Alerts: Yes cellular tissue based products placed in the Patient Alerts: [facility] center since last visit: Has Dressing in Place as Prescribed: Yes Has Footwear/Offloading in Place as Prescribed: Yes Left: Surgical Shoe with Pressure Relief Insole Pain Present Now: No Electronic Signature(s) Signed: 05/24/2018 3:17:30 PM By: Alric Quan Entered By: Alric Quan on 05/24/2018 15:17:29 [name] ([id number]) -------------------------------------------------------------------------------- Encounter Discharge Information Details Patient Name: Heather Dear A. Date of Service: 05/24/2018 2:15 PM Medical Record Number: [id number] Patient Account Number: [id number] Date of Birth/Sex: 06/15/1976 (42 y.o. F) Treating RN: Ahmed Prima Primary Care Marci Polito: Suzan Garibaldi Other Clinician: Referring Doreen Garretson: Suzan Garibaldi Treating Treanna Dumler/Extender: Cathie Olden in Treatment: 41 Encounter Discharge Information Items Discharge Condition: Stable Ambulatory Status: Wheelchair Discharge Destination: Home Transportation: Private Auto Accompanied By: mother Schedule Follow-up Appointment: Yes Clinical Summary of Care: Electronic Signature(s) Signed: 05/24/2018 3:22:00 PM By: Alric Quan Entered By: Alric Quan on 05/24/2018 15:21:59 [name] ([id number]) -------------------------------------------------------------------------------- Multi Wound Chart Details Patient Name: Heather Dear A. Date of Service: 05/24/2018 2:15 PM Medical Record Number: [id number] Patient Account Number: [id number] Date of Birth/Sex: 06-18-1976 (42 y.o. F) Treating RN: Ahmed Prima Primary Care Zaden Sako: Suzan Garibaldi Other Clinician: Referring Deetra Booton: Suzan Garibaldi Treating Eion Timbrook/Extender: Cathie Olden in Treatment: 18 Vital Signs Height(in): 63 Pulse(bpm): 122 Weight(lbs): 250 Blood Pressure(mmHg): 123/103 Body Mass Index(BMI): 37 Temperature(F): 98.5 Respiratory Rate 18 (breaths/min): Photos: [1:No Photos] [N/A:N/A] Wound Location: [1:Left Toe Great] [N/A:N/A] Wounding Event: [1:Gradually Appeared] [N/A:N/A] Primary Etiology: [1:Diabetic Wound/Ulcer of the N/A Lower Extremity] Comorbid History: [1:Chronic Obstructive Pulmonary Disease (COPD), Congestive Heart Failure, Hypertension, Type II Diabetes, Neuropathy, Seizure Disorder] [N/A:N/A] Date Acquired: [1:01/01/2018] [N/A:N/A] Weeks of Treatment: [1:18] [N/A:N/A] Wound Status: [1:Open] [N/A:N/A] Pending Amputation on [1:Yes] [N/A:N/A] Presentation: Measurements L x W x D [1:0.3x0.2x0.4] [N/A:N/A] (cm) Area (cm) : [1:0.047] [N/A:N/A] Volume (cm) : [1:0.019] [N/A:N/A] % Reduction in Area: [1:99.40%] [N/A:N/A] % Reduction in Volume: [1:99.40%]  [N/A:N/A] Classification: [1:Grade 2] [N/A:N/A] Exudate Amount: [1:Small] [N/A:N/A] Exudate Type: [1:Serosanguineous] [N/A:N/A] Exudate Color: [1:red, brown] [N/A:N/A] Wound Margin: [1:Flat and Intact] [N/A:N/A] Granulation Amount: [1:Medium (34-66%)] [N/A:N/A] Granulation Quality: [1:Red] [N/A:N/A] Necrotic Amount: [1:Medium (34-66%)] [N/A:N/A] Necrotic Tissue: [1:Eschar, Adherent Slough] [N/A:N/A] Exposed Structures: [1:Fat Layer (Subcutaneous Tissue) Exposed: Yes Fascia: No Tendon: No Muscle: No] [N/A:N/A] Joint: No Bone: No Epithelialization: None N/A N/A Debridement: Debridement - Selective/Open N/A N/A Wound Pre-procedure 15:07 N/A N/A Verification/Time Out Taken: Pain Control: Lidocaine 4% Topical Solution N/A N/A Tissue Debrided: Callus N/A N/A Level: Skin/Epidermis N/A N/A Debridement Area (sq cm): 0.06 N/A N/A Instrument: Curette N/A N/A Bleeding: None N/A  N/A Procedural Pain: 0 N/A N/A Post Procedural Pain: 0 N/A N/A Debridement Treatment Procedure was tolerated well N/A N/A Response: Post Debridement 0.3x0.2x0.4 N/A N/A Measurements L x W x D (cm) Post Debridement Volume: 0.019 N/A N/A (cm) Periwound Skin Texture: Callus: Yes N/A N/A Excoriation: No Induration: No Crepitus: No Rash: No Scarring: No Periwound Skin Moisture: Maceration: Yes N/A N/A Dry/Scaly: No Periwound Skin Color: Erythema: Yes N/A N/A Atrophie Blanche: No Cyanosis: No Ecchymosis: No Hemosiderin Staining: No Mottled: No Pallor: No Rubor: No Erythema Location: Circumferential N/A N/A Temperature: No Abnormality N/A N/A Tenderness on Palpation: No N/A N/A Wound Preparation: Ulcer Cleansing: N/A N/A Rinsed/Irrigated with Saline Topical Anesthetic Applied: Other: lidocaine 4% Procedures Performed: Debridement N/A N/A Treatment Notes Electronic Signature(s) Signed: 05/24/2018 3:19:38 PM By: Alric Quan Entered By: Alric Quan on 05/24/2018 15:19:37 [name]  ([id number]) -------------------------------------------------------------------------------- [place]ida Plan Details Patient Name: Heather Dear A. Date of Service: 05/24/2018 2:15 PM Medical Record Number: [id number] Patient Account Number: [id number] Date of Birth/Sex: 10/16/1976 (42 y.o. F) Treating RN: Ahmed Prima Primary Care Ferman Basilio: Suzan Garibaldi Other Clinician: Referring Mortimer Bair: Suzan Garibaldi Treating Janina Trafton/Extender: Cathie Olden in Treatment: 18 Active Inactive ` Abuse / Safety / Falls / Self Care Management Nursing Diagnoses: Potential for falls Goals: Patient will not experience any injury related to falls Date Initiated: 01/18/2018 Target Resolution Date: 04/28/2018 Goal Status: Active Interventions: Assess fall risk on admission and as needed Assess: immobility, friction, shearing, incontinence upon admission and as needed Assess impairment of mobility on admission and as needed per policy Assess personal safety and home safety (as indicated) on admission and as needed Assess self care needs on admission and as needed Notes: ` Nutrition Nursing Diagnoses: Imbalanced nutrition Impaired glucose control: actual or potential Potential for alteratiion in Nutrition/Potential for imbalanced nutrition Goals: Patient/caregiver agrees to and verbalizes understanding of need to use nutritional supplements and/or vitamins as prescribed Date Initiated: 01/18/2018 Target Resolution Date: 04/28/2018 Goal Status: Active Patient/caregiver will maintain therapeutic glucose control Date Initiated: 01/18/2018 Target Resolution Date: 04/28/2018 Goal Status: Active Interventions: Assess patient nutrition upon admission and as needed per policy Provide education on elevated blood sugars and impact on wound healing Notes: SADHANA, FRATER ([id number]) Orientation to the Wound Care Program Nursing Diagnoses: Knowledge deficit related to the wound healing  center program Goals: Patient/caregiver will verbalize understanding of the Teller Date Initiated: 01/18/2018 Target Resolution Date: 01/27/2018 Goal Status: Active Interventions: Provide education on orientation to the wound center Notes: ` Wound/Skin Impairment Nursing Diagnoses: Impaired tissue integrity Knowledge deficit related to smoking impact on wound healing Knowledge deficit related to ulceration/compromised skin integrity Goals: Ulcer/skin breakdown will have a volume reduction of 80% by week 12 Date Initiated: 01/18/2018 Target Resolution Date: 04/21/2018 Goal Status: Active Interventions: Assess patient/caregiver ability to perform ulcer/skin care regimen upon admission and as needed Assess ulceration(s) every visit Notes: Electronic Signature(s) Signed: 05/24/2018 3:19:10 PM By: Alric Quan Entered By: Alric Quan on 05/24/2018 15:19:08 [name] ([id number]) -------------------------------------------------------------------------------- Pain Assessment Details Patient Name: Heather Dear A. Date of Service: 05/24/2018 2:15 PM Medical Record Number: [id number] Patient Account Number: [id number] Date of Birth/Sex: 1976-01-22 (42 y.o. F) Treating RN: Ahmed Prima Primary Care Elianah Karis: Suzan Garibaldi Other Clinician: Referring Quyen Cutsforth: Suzan Garibaldi Treating Parminder Cupples/Extender: Cathie Olden in Treatment: 18 Active Problems Location of Pain Severity and Description of Pain Patient Has Paino No Site Locations Pain Management and Medication Current Pain Management: Electronic Signature(s) Signed: 05/24/2018 3:17:44 PM  By: Alric Quan Entered By: Alric Quan on 05/24/2018 15:17:44 [name] ([id number]) -------------------------------------------------------------------------------- Patient/Caregiver Education Details Patient Name: Heather Dear A. Date of Service: 05/24/2018 2:15 PM Medical Record  Number: [id number] Patient Account Number: [id number] Date of Birth/Gender: [birth year] (42 y.o. F) Treating RN: Ahmed Prima Primary Care Physician: Suzan Garibaldi Other Clinician: Referring Physician: Suzan Garibaldi Treating Physician/Extender: Cathie Olden in Treatment: 18 Education Assessment Education Provided To: Patient Education Topics Provided Wound/Skin Impairment: Handouts: Caring for Your Ulcer, Skin Care Do's and Dont's, Other: change dressing as ordered Methods: Demonstration, Explain/Verbal Responses: State content correctly Electronic Signature(s) Signed: 05/28/2018 4:59:33 PM By: Alric Quan Entered By: Alric Quan on 05/24/2018 15:22:21 Ressel, Taira A. ([id number]) -------------------------------------------------------------------------------- Wound Assessment Details Patient Name: Heather Dear A. Date of Service: 05/24/2018 2:15 PM Medical Record Number: [id number] Patient Account Number: [id number] Date of Birth/Sex: [birth year] (42 y.o. F) Treating RN: Ahmed Prima Primary Care Arelene Moroni: Suzan Garibaldi Other Clinician: Referring Draken Farrior: Suzan Garibaldi Treating Burris Matherne/Extender: Cathie Olden in Treatment: 18 Wound Status Wound Number: 1 Primary Diabetic Wound/Ulcer of the Lower Extremity Etiology: Wound Location: Left Toe Great Wound Open Wounding Event: Gradually Appeared Status: Date Acquired: 01/01/2018 Comorbid Chronic Obstructive Pulmonary Disease Weeks Of Treatment: 18 History: (COPD), Congestive Heart Failure, Clustered Wound: No Hypertension, Type II Diabetes, Neuropathy, Pending Amputation On Presentation Seizure Disorder Photos Photo Uploaded By: Gretta Cool, BSN, RN, CWS, [name] on 05/25/2018 18:13:10 Wound Measurements Length: (cm) 0.3 Width: (cm) 0.2 Depth: (cm) 0.4 Area: (cm) 0.047 Volume: (cm) 0.019 % Reduction in Area: 99.4% % Reduction in Volume: 99.4% Epithelialization: None Tunneling: No Undermining:  No Wound Description Classification: Grade 2 Foul Odor Wound Margin: Flat and Intact Slough/Fi Exudate Amount: Small Exudate Type: Serosanguineous Exudate Color: red, brown After Cleansing: No brino Yes Wound Bed Granulation Amount: Medium (34-66%) Exposed Structure Granulation Quality: Red Fascia Exposed: No Necrotic Amount: Medium (34-66%) Fat Layer (Subcutaneous Tissue) Exposed: Yes Necrotic Quality: Eschar, Adherent Slough Tendon Exposed: No Muscle Exposed: No Joint Exposed: No Bone Exposed: No Ayotte, Aloma A. ([id number]) Periwound Skin Texture Texture Color No Abnormalities Noted: No No Abnormalities Noted: No Callus: Yes Atrophie Blanche: No Crepitus: No Cyanosis: No Excoriation: No Ecchymosis: No Induration: No Erythema: Yes Rash: No Erythema Location: Circumferential Scarring: No Hemosiderin Staining: No Mottled: No Moisture Pallor: No No Abnormalities Noted: No Rubor: No Dry / Scaly: No Maceration: Yes Temperature / Pain Temperature: No Abnormality Wound Preparation Ulcer Cleansing: Rinsed/Irrigated with Saline Topical Anesthetic Applied: Other: lidocaine 4%, Treatment Notes Wound #1 (Left Toe Great) 1. Cleansed with: Clean wound with Normal Saline 2. Anesthetic Topical Lidocaine 4% cream to wound bed prior to debridement 4. Dressing Applied: Hydrafera Blue 5. Secondary Dressing Applied Dry Gauze Kerlix/Conform 7. Secured with Recruitment consultant) Signed: 05/28/2018 4:59:33 PM By: Alric Quan Entered By: Alric Quan on 05/24/2018 15:04:44 [name] ([id number]) -------------------------------------------------------------------------------- Spring Details Patient Name: Heather Dear A. Date of Service: 05/24/2018 2:15 PM Medical Record Number: [id number] Patient Account Number: [id number] Date of Birth/Sex: 08-May-1976 (42 y.o. F) Treating RN: Ahmed Prima Primary Care Mykal Kirchman: Suzan Garibaldi Other  Clinician: Referring Giovani Neumeister: Suzan Garibaldi Treating Madhav Mohon/Extender: Cathie Olden in Treatment: 18 Vital Signs Time Taken: 14:52 Temperature (F): 98.5 Height (in): 69 Pulse (bpm): 122 Weight (lbs): 250 Respiratory Rate (breaths/min): 18 Body Mass Index (BMI): 36.9 Blood Pressure (mmHg): 123/103 Reference Range: 80 - 120 mg / dl Notes Rober Minion, NP aware of vs. Electronic Signature(s) Signed: 05/24/2018 3:18:37 PM By: Alric Quan Entered By: Alric Quan on  05/24/2018 15:18:36 

## 2018-06-06 NOTE — Progress Notes (Signed)
Heather, [name] ([id number]) Visit Report for 05/24/2018 Chief Complaint Document Details Patient Name: [name], SCHLAGEL A. Date of Service: 05/24/2018 2:15 PM Medical Record Number: [id number] Patient Account Number: [id number] Date of Birth/Sex: 1976/09/17 (42 y.o. F) Treating RN: Ahmed Prima Primary Care Provider: Suzan Garibaldi Other Clinician: Referring Provider: Suzan Garibaldi Treating Provider/Extender: Cathie Olden in Treatment: 18 Information Obtained from: Patient Chief Complaint She is here for evaluation of a left great toe ulcer Electronic Signature(s) Signed: 05/24/2018 3:13:21 PM By: Lawanda Cousins Entered By: Lawanda Cousins on 05/24/2018 15:13:20 [name] ([id number]) -------------------------------------------------------------------------------- Debridement Details Patient Name: Heather Dear A. Date of Service: 05/24/2018 2:15 PM Medical Record Number: [id number] Patient Account Number: [id number] Date of Birth/Sex: [birth year] (42 y.o. F) Treating RN: Ahmed Prima Primary Care Provider: Suzan Garibaldi Other Clinician: Referring Provider: Suzan Garibaldi Treating Provider/Extender: Cathie Olden in Treatment: 18 Debridement Performed for Wound #1 Left Toe Great Assessment: Performed By: Physician Lawanda Cousins, NP Debridement Type: Debridement Severity of Tissue Pre Limited to breakdown of skin Debridement: Pre-procedure Verification/Time Yes - 15:07 Out Taken: Start Time: 15:07 Pain Control: Lidocaine 4% Topical Solution Total Area Debrided (L x W): 0.3 (cm) x 0.2 (cm) = 0.06 (cm) Tissue and other material Viable, Non-Viable, Callus, Skin: Dermis , Skin: Epidermis debrided: Level: Skin/Epidermis Debridement Description: Selective/Open Wound Instrument: Curette Bleeding: None End Time: 15:08 Procedural Pain: 0 Post Procedural Pain: 0 Response to Treatment: Procedure was tolerated well Level of Consciousness: Awake and Alert Post  Debridement Measurements of Total Wound Length: (cm) 0.3 Width: (cm) 0.2 Depth: (cm) 0.4 Volume: (cm) 0.019 Character of Wound/Ulcer Post Debridement: Stable Severity of Tissue Post Debridement: Limited to breakdown of skin Post Procedure Diagnosis Same as Pre-procedure Electronic Signature(s) Signed: 05/24/2018 3:12:48 PM By: Lawanda Cousins Signed: 05/28/2018 4:59:33 PM By: Alric Quan Entered By: Lawanda Cousins on 05/24/2018 15:12:47 [name], [name] ([id number]) -------------------------------------------------------------------------------- HPI Details Patient Name: Heather Dear A. Date of Service: 05/24/2018 2:15 PM Medical Record Number: [id number] Patient Account Number: [id number] Date of Birth/Sex: 1976/04/09 (42 y.o. F) Treating RN: Ahmed Prima Primary Care Provider: Suzan Garibaldi Other Clinician: Referring Provider: Suzan Garibaldi Treating Provider/Extender: Cathie Olden in Treatment: 18 History of Present Illness HPI Description: 01/18/18-She is here for initial evaluation of the left great toe ulcer. She is a poor historian in regards to timeframe in detail. She states approximately 4 weeks ago she lacerated her toe on something in the house. She followed up with her primary care who placed her on Bactrim and ultimately a second dose of Bactrim prior to coming to wound clinic. She states she has been treating the toe with peroxide, Betadine and a Band-Aid. She did not check her blood sugar this morning but checked it yesterday morning it was 327; she is unaware of a recent A1c and there are no current records. She saw Dr. she would've orthopedics last week for an old injury to the left ankle, she states he did not see her toe, nor did she bring it to his attention. She smokes approximately 1 pack cigarettes a day. Her social situation is concerning, she arrives this morning with her mother who appears extremely intoxicated/under the influence; her mother was  asked to leave the room and be monitored by the patient's grandmother. The patient's aunt then accompanied the patient and the room throughout the rest of the appointment. We had a lengthy discussion regarding the deleterious effects of uncontrolled hyperglycemia and smoking as it relates to wound healing and overall health.  She was strongly encouraged to decrease her smoking and get her diabetes under better control. She states she is currently on a diet and has cut down her [facility] consumption. The left toe is erythematous, macerated and slightly edematous with malodor present. The edema in her left foot is below her baseline, there is no erythema streaking. We will treat her with Santyl, doxycycline; we have ordered and xray, culture and provided a Peg assist surgical shoe and cultured the wound. 01/25/18-She is here in follow-up evaluation for a left great toe ulcer and presents with an abscess to her suprapubic area. She states her blood sugars remain elevated, feeling "sick" and if levels are below 250, but she is trying. She has made no attempt to decrease her smoking stating that we "can't take away her food in her cigarettes". She has been compliant with offloading using the PEG assist you. She is using Santyl daily. the culture obtained last week grew staph aureus and Enterococcus faecalis; continues on the doxycycline and Augmentin was added on Monday. The suprapubic area has erythema, no femoral variation, purple discoloration, minimal induration, was accessed with a cotton tip applicator with sanguinopurulent drainage, this was cultured, I suspect the current antibiotic treatment will cover and we will not add anything to her current treatment plan. She was advised to go to urgent care or ER with any change in redness, induration or fever. 02/01/18-She is here in follow-up evaluation for left great toe ulcers and a new abdominal abscess from last week. She was able to use packing  until earlier this week, where she "forgot it was there". She states she was feeling ill with GI symptoms last week and was not taking her antibiotic. She states her glucose levels have been predominantly less than 200, with occasional levels between 200-250. She thinks this was contributing to her GI symptoms as they have resolved without intervention. There continues to be significant laceration to left toe, otherwise it clinically looks stable/improved. There is now less superficial opening to the lateral aspect of the great toe that was residual blister. We will transition to [facility] to all wounds, she will continue her Augmentin. If there is no change or deterioration next week for reculture. 02/08/18-She is here in follow-up evaluation for left great toe ulcer and abdominal ulcer. There is an improvement in both wounds. She has been wrapping her left toe with coban, not by our direction, which has created an area of discoloration to the medial aspect; she has been advised to NOT use coban secondary to her neuropathy. She states her glucose levels have been high over this last week ranging from 200-350, she continues to smoke. She admits to being less compliant with her offloading shoe. We will continue with same treatment plan and she will follow-up next week. 02/15/18-She is here in follow-up evaluation for left great toe ulcer and abdominal ulcer. The abdominal ulcer is epithelialized. The left great toe ulcer is improved and all injury from last week using the Coban wrap is resolved, the lateral ulcer is healed. She admits to noncompliance with wearing offloading shoe and admits to glucose levels being greater than 300 most of the week. She continues to smoke and expresses no desire to quit. There is one area medially that probes deeper than it has historically, erythema to the toe and dorsal foot has consistently waxed and waned. There is no overt signs of cellulitis or infection but  we will culture the wound for any occult infection given  the new area of depth and erythema. We will hold off on sensitivities for initiation of antibiotic therapy. 02/22/18-She is here in follow up evaluation for left great toe ulcer. There is overall significant improvement in both wound appearance, erythema and edema with changes made last week. She was not initiated on antibiotic therapy. Culture obtained last week showed oxacillin sensitive staph aureus, sensitive to clindamycin. Clindamycin has been called into the pharmacy but she has been instructed to hold off on initiation secondary to overall clinical improvement and her history of antibiotic intolerance. She has been instructed to contact the clinic with any noted changes/deterioration and the wound, erythema, Vieyra, Smantha A. ([id number]) edema and/or pain. She will follow-up next week. She continues to smoke and her glucose levels remain elevated >250; she admits to compliance with offloading shoe 03/01/18 on evaluation today patient appears to be doing fairly well in regard to her left first toe ulcer. She has been tolerating the dressing changes with the [facility] Dressing without complication and overall this has definitely showed signs of improvement according to records as well is what the patient tells me today. I'm very pleased in that regard. She is having no pain today 03/08/18 She is here for follow up evaluation of a left great toe ulcer. She remains non-compliant with glucose control and smoking cessation; glucose levels consistently >200. She states that she got new shoe inserts/peg assist. She admits to compliance with offloading. Since my last evaluation there is significant improvement. We will switch to prisma at this time and she will follow up next week. She is noted to be tachycardic at this appointment, heart rate 120s; she has a history of heart rate 70-130 according to our records. She admits to extreme  agitation r/t personal issues; she was advised to monitor her heartrate and contact her physician if it does not return to a more normal range (<100). She takes cardizem twice daily. 03/15/18-She is here in follow-up evaluation for left great toe ulcer. She remains noncompliant with glucose control and smoking cessation. She admits to compliance with wearing offloading shoe. The ulcer is improved/stable and we will continue with the same treatment plan and she will follow-up next week 03/22/18-She is here for evaluation for left great toe ulcer. There continues to be significant improvement despite recurrent hyperglycemia (over 500 yesterday) and she continues to smoke. She has been compliant with offloading and we will continue with same treatment plan and she will follow-up next week. 03/29/18-She is here for evaluation for left great toe ulcer. Despite continuing to smoke and uncontrolled diabetes she continues to improve. She is compliant with offloading shoe. We will continue with the same treatment plan and she will follow-up next week 04/05/18- She is here in follow up evaluation for a left great toe ulcer; she presents with small pustule to left fifth toe (resembles ant bite). She admits to compliance with wearing offloading shoe; continues to smoke or have uncontrolled blood glucose control. There is more callus than usual with evidence of bleeding; she denies known trauma. 04/12/18-She is here for evaluation of left great toe ulcer. Despite noncompliance with glycemic control and smoking she continues to make improvement. She continues to wear offloading shoe. The pustule, that was identified last week, to the left fifth toe is resolved. She will follow-up in 2 weeks 05/03/18-she is seen in follow-up evaluation for a left great toe ulcer. She is compliant with offloading, otherwise noncompliant with glycemic control and smoking. She has plateaued and there  is minimal improvement noted. We will  transition to [facility], replaced the insert to her surgical shoe and she will follow-up in one week 05/10/18- She is here in follow up evaluation for a left great toe ulcer. It appears stable despite measurement change. We will continue with same treatment plan and follow up next week. a/1/19-She is seen in follow-up evaluation for a left great toe ulcer. She remains compliant with offloading, has made significant improvement in her diet, decreasing the amount of sugar/soda. She said her recent A1c was 10.9 which is lower than. She did see a diabetic nutritionist/educator yesterday. She continues to smoke. We will continue with the same treatment plan and she'll follow-up next week. Electronic Signature(s) Signed: 05/24/2018 3:14:48 PM By: Lawanda Cousins Entered By: Lawanda Cousins on 05/24/2018 15:14:48 [name] ([id number]) -------------------------------------------------------------------------------- Physician Orders Details Patient Name: Heather Dear A. Date of Service: 05/24/2018 2:15 PM Medical Record Number: [id number] Patient Account Number: [id number] Date of Birth/Sex: 11/13/1975 (42 y.o. F) Treating RN: Ahmed Prima Primary Care Provider: Suzan Garibaldi Other Clinician: Referring Provider: Suzan Garibaldi Treating Provider/Extender: Cathie Olden in Treatment: 24 Verbal / Phone Orders: Yes Clinician: Carolyne Fiscal, Debi Read Back and Verified: Yes Diagnosis Coding Wound Cleansing Wound #1 Left Toe Great o Clean wound with Normal Saline. o Cleanse wound with mild soap and water o May Shower, gently pat wound dry prior to applying new dressing. Anesthetic (add to Medication List) Wound #1 Left Toe Great o Topical Lidocaine 4% cream applied to wound bed prior to debridement (In Clinic Only). Primary Wound Dressing Wound #1 Left Toe Great o Hydrafera Blue Ready Transfer Secondary Dressing Wound #1 Left Toe Great o Dry Gauze o  Conform/Kerlix Dressing Change Frequency Wound #1 Left Toe Great o Change dressing every other day. o Other: - as needed Follow-up Appointments Wound #1 Left Toe Great o Return Appointment in 1 week. Edema Control Wound #1 Left Toe Great o Elevate legs to the level of the heart and pump ankles as often as possible Off-Loading Wound #1 Left Toe Great o Open toe surgical shoe with peg assist. Additional Orders / Instructions Wound #1 Left Toe Great o Stop Smoking o Increase protein intake. o Other: - use Betadine on the left 5th toe Kinkade, Jalen A. ([id number]) Patient Medications Allergies: erythromycin base Notifications Medication Indication Start End lidocaine DOSE 1 - topical 4 % cream - 1 cream topical Electronic Signature(s) Signed: 05/24/2018 9:54:05 PM By: Lawanda Cousins Signed: 05/28/2018 4:59:33 PM By: Alric Quan Entered By: Alric Quan on 05/24/2018 15:03:48 [name] ([id number]) -------------------------------------------------------------------------------- Prescription 05/24/2018 Patient Name: Heather Dear A. Provider: Lawanda Cousins NP Date of Birth: [birth year] NPI#: [id number] Sex: F DEA#: [id number] Phone #: [phone] License #: Patient Address: [place], [place] [address] [address] [place], [place] [address] [phone] Allergies erythromycin base Medication Medication: Route: Strength: Form: lidocaine topical 4% cream Class: TOPICAL LOCAL ANESTHETICS Dose: Frequency / Time: Indication: 1 1 cream topical Number of Refills: Number of Units: 0 Generic Substitution: Start Date: End Date: Administered at Substitution Permitted Facility: Yes Time Administered: Time Discontinued: Note to Pharmacy: Signature(s): Date(s): Electronic Signature(s) Signed: 05/24/2018 9:54:05 PM By: Lawanda Cousins Signed:  05/28/2018 4:59:33 PM By: Alric Quan Entered By: Alric Quan on 05/24/2018 15:03:50 [name] ([id number]) [name], Cledith A. ([id number]) --------------------------------------------------------------------------------  Problem List Details Patient Name: Heather Dear A. Date of Service: 05/24/2018 2:15 PM Medical Record  Number: [id number] Patient Account Number: [id number] Date of Birth/Sex: 1975-11-19 (41 y.o. 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 ([id number]) -------------------------------------------------------------------------------- Progress Note Details Patient Name: Heather Dear A. Date of Service: 05/24/2018 2:15 PM Medical Record Number: [id number] Patient Account Number: [id number] Date of Birth/Sex: 1976/04/19 (42 y.o. F) Treating RN: Ahmed Prima Primary Care Provider: Suzan Garibaldi Other Clinician: Referring Provider: Suzan Garibaldi Treating Provider/Extender: Cathie Olden in Treatment: 18 Subjective Chief Complaint Information obtained from Patient She is here for evaluation of a left great toe ulcer History of Present Illness (HPI) 01/18/18-She is  here for initial evaluation of the left great toe ulcer. She is a poor historian in regards to timeframe in detail. She states approximately 4 weeks ago she lacerated her toe on something in the house. She followed up with her primary care who placed her on Bactrim and ultimately a second dose of Bactrim prior to coming to wound clinic. She states she has been treating the toe with peroxide, Betadine and a Band-Aid. She did not check her blood sugar this morning but checked it yesterday morning it was 327; she is unaware of a recent A1c and there are no current records. She saw Dr. she would've orthopedics last week for an old injury to the left ankle, she states he did not see her toe, nor did she bring it to his attention. She smokes approximately 1 pack cigarettes a day. Her social situation is concerning, she arrives this morning with her mother who appears extremely intoxicated/under the influence; her mother was asked to leave the room and be monitored by the patient's grandmother. The patient's aunt then accompanied the patient and the room throughout the rest of the appointment. We had a lengthy discussion regarding the deleterious effects of uncontrolled hyperglycemia and smoking as it relates to wound healing and overall health. She was strongly encouraged to decrease her smoking and get her diabetes under better control. She states she is currently on a diet and has cut down her [facility] consumption. The left toe is erythematous, macerated and slightly edematous with malodor present. The edema in her left foot is below her baseline, there is no erythema streaking. We will treat her with Santyl, doxycycline; we have ordered and xray, culture and provided a Peg assist surgical shoe and cultured the wound. 01/25/18-She is here in follow-up evaluation for a left great toe ulcer and presents with an abscess to her suprapubic area. She states her blood sugars remain elevated, feeling "sick"  and if levels are below 250, but she is trying. She has made no attempt to decrease her smoking stating that we "can't take away her food in her cigarettes". She has been compliant with offloading using the PEG assist you. She is using Santyl daily. the culture obtained last week grew staph aureus and Enterococcus faecalis; continues on the doxycycline and  Augmentin was added on Monday. The suprapubic area has erythema, no femoral variation, purple discoloration, minimal induration, was accessed with a cotton tip applicator with sanguinopurulent drainage, this was cultured, I suspect the current antibiotic treatment will cover and we will not add anything to her current treatment plan. She was advised to go to urgent care or ER with any change in redness, induration or fever. 02/01/18-She is here in follow-up evaluation for left great toe ulcers and a new abdominal abscess from last week. She was able to use packing until earlier this week, where she "forgot it was there". She states she was feeling ill with GI symptoms last week and was not taking her antibiotic. She states her glucose levels have been predominantly less than 200, with occasional levels between 200-250. She thinks this was contributing to her GI symptoms as they have resolved without intervention. There continues to be significant laceration to left toe, otherwise it clinically looks stable/improved. There is now less superficial opening to the lateral aspect of the great toe that was residual blister. We will transition to [facility] to all wounds, she will continue her Augmentin. If there is no change or deterioration next week for reculture. 02/08/18-She is here in follow-up evaluation for left great toe ulcer and abdominal ulcer. There is an improvement in both wounds. She has been wrapping her left toe with coban, not by our direction, which has created an area of discoloration to the medial aspect; she has been advised to  NOT use coban secondary to her neuropathy. She states her glucose levels have been high over this last week ranging from 200-350, she continues to smoke. She admits to being less compliant with her offloading shoe. We will continue with same treatment plan and she will follow-up next week. 02/15/18-She is here in follow-up evaluation for left great toe ulcer and abdominal ulcer. The abdominal ulcer is epithelialized. The left great toe ulcer is improved and all injury from last week using the Coban wrap is resolved, the lateral ulcer is healed. She admits to noncompliance with wearing offloading shoe and admits to glucose levels being greater than 300 most of the week. She continues to smoke and expresses no desire to quit. There is one area medially that probes deeper than it has historically, erythema to the toe and dorsal foot has consistently waxed and waned. There is no overt signs of cellulitis or infection but we will culture the wound for any occult infection given the new area of depth and erythema. We will hold off on Musial, Shirin A. ([id number]) sensitivities for initiation of antibiotic therapy. 02/22/18-She is here in follow up evaluation for left great toe ulcer. There is overall significant improvement in both wound appearance, erythema and edema with changes made last week. She was not initiated on antibiotic therapy. Culture obtained last week showed oxacillin sensitive staph aureus, sensitive to clindamycin. Clindamycin has been called into the pharmacy but she has been instructed to hold off on initiation secondary to overall clinical improvement and her history of antibiotic intolerance. She has been instructed to contact the clinic with any noted changes/deterioration and the wound, erythema, edema and/or pain. She will follow-up next week. She continues to smoke and her glucose levels remain elevated >250; she admits to compliance with offloading shoe 03/01/18 on evaluation today  patient appears to be doing fairly well in regard to her left first toe ulcer. She has been tolerating the dressing changes with the [facility] Dressing without complication and  overall this has definitely showed signs of improvement according to records as well is what the patient tells me today. I'm very pleased in that regard. She is having no pain today 03/08/18 She is here for follow up evaluation of a left great toe ulcer. She remains non-compliant with glucose control and smoking cessation; glucose levels consistently >200. She states that she got new shoe inserts/peg assist. She admits to compliance with offloading. Since my last evaluation there is significant improvement. We will switch to prisma at this time and she will follow up next week. She is noted to be tachycardic at this appointment, heart rate 120s; she has a history of heart rate 70-130 according to our records. She admits to extreme agitation r/t personal issues; she was advised to monitor her heartrate and contact her physician if it does not return to a more normal range (<100). She takes cardizem twice daily. 03/15/18-She is here in follow-up evaluation for left great toe ulcer. She remains noncompliant with glucose control and smoking cessation. She admits to compliance with wearing offloading shoe. The ulcer is improved/stable and we will continue with the same treatment plan and she will follow-up next week 03/22/18-She is here for evaluation for left great toe ulcer. There continues to be significant improvement despite recurrent hyperglycemia (over 500 yesterday) and she continues to smoke. She has been compliant with offloading and we will continue with same treatment plan and she will follow-up next week. 03/29/18-She is here for evaluation for left great toe ulcer. Despite continuing to smoke and uncontrolled diabetes she continues to improve. She is compliant with offloading shoe. We will continue with the same  treatment plan and she will follow-up next week 04/05/18- She is here in follow up evaluation for a left great toe ulcer; she presents with small pustule to left fifth toe (resembles ant bite). She admits to compliance with wearing offloading shoe; continues to smoke or have uncontrolled blood glucose control. There is more callus than usual with evidence of bleeding; she denies known trauma. 04/12/18-She is here for evaluation of left great toe ulcer. Despite noncompliance with glycemic control and smoking she continues to make improvement. She continues to wear offloading shoe. The pustule, that was identified last week, to the left fifth toe is resolved. She will follow-up in 2 weeks 05/03/18-she is seen in follow-up evaluation for a left great toe ulcer. She is compliant with offloading, otherwise noncompliant with glycemic control and smoking. She has plateaued and there is minimal improvement noted. We will transition to [facility], replaced the insert to her surgical shoe and she will follow-up in one week 05/10/18- She is here in follow up evaluation for a left great toe ulcer. It appears stable despite measurement change. We will continue with same treatment plan and follow up next week. a/1/19-She is seen in follow-up evaluation for a left great toe ulcer. She remains compliant with offloading, has made significant improvement in her diet, decreasing the amount of sugar/soda. She said her recent A1c was 10.9 which is lower than. She did see a diabetic nutritionist/educator yesterday. She continues to smoke. We will continue with the same treatment plan and she'll follow-up next week. Objective Constitutional Vitals Time Taken: 2:52 PM, Height: 69 in, Weight: 250 lbs, BMI: 36.9, Temperature: 98.5 F, Pulse: 122 bpm, Respiratory Rate: 18 breaths/min, Blood Pressure: 123/103 mmHg. General Notes: Rober Minion, NP aware of vs. Schnabel, Shenell A. ([id number]) Integumentary (Hair, Skin) Wound  #1 status is Open. Original cause of wound  was Gradually Appeared. The wound is located on the Left Toe Great. The wound measures 0.3cm length x 0.2cm width x 0.4cm depth; 0.047cm^2 area and 0.019cm^3 volume. There is Fat Layer (Subcutaneous Tissue) Exposed exposed. There is no tunneling or undermining noted. There is a small amount of serosanguineous drainage noted. The wound margin is flat and intact. There is medium (34-66%) red granulation within the wound bed. There is a medium (34-66%) amount of necrotic tissue within the wound bed including Eschar and Adherent Slough. The periwound skin appearance exhibited: Callus, Maceration, Erythema. The periwound skin appearance did not exhibit: Crepitus, Excoriation, Induration, Rash, Scarring, Dry/Scaly, Atrophie Blanche, Cyanosis, Ecchymosis, Hemosiderin Staining, Mottled, Pallor, Rubor. The surrounding wound skin color is noted with erythema which is circumferential. Periwound temperature was noted as No Abnormality. Assessment Active Problems ICD-10 Type 2 diabetes mellitus with foot ulcer Non-pressure chronic ulcer of other part of left foot limited to breakdown of skin Procedures Wound #1 Pre-procedure diagnosis of Wound #1 is a Diabetic Wound/Ulcer of the Lower Extremity located on the Left Toe Great .Severity of Tissue Pre Debridement is: Limited to breakdown of skin. There was a Selective/Open Wound Skin/Epidermis Debridement with a total area of 0.06 sq cm performed by Lawanda Cousins, NP. With the following instrument(s): Curette to remove Viable and Non-Viable tissue/material. Material removed includes Callus, Skin: Dermis, and Skin: Epidermis after achieving pain control using Lidocaine 4% Topical Solution. No specimens were taken. A time out was conducted at 15:07, prior to the start of the procedure. There was no bleeding. The procedure was tolerated well with a pain level of 0 throughout and a pain level of 0 following the procedure.  Patient s Level of Consciousness post procedure was recorded as Awake and Alert. Post Debridement Measurements: 0.3cm length x 0.2cm width x 0.4cm depth; 0.019cm^3 volume. Character of Wound/Ulcer Post Debridement is stable. Severity of Tissue Post Debridement is: Limited to breakdown of skin. Post procedure Diagnosis Wound #1: Same as Pre-Procedure Plan Wound Cleansing: Wound #1 Left Toe Great: Clean wound with Normal Saline. Cleanse wound with mild soap and water May Shower, gently pat wound dry prior to applying new dressing. Anesthetic (add to Medication List): Wound #1 Left Toe Great: Topical Lidocaine 4% cream applied to wound bed prior to debridement (In Clinic Only). Primary Wound Dressing: GAGANDEEP, KOSSMAN A. ([id number]) Wound #1 Left Toe Great: Hydrafera Blue Ready Transfer Secondary Dressing: Wound #1 Left Toe Great: Dry Gauze Conform/Kerlix Dressing Change Frequency: Wound #1 Left Toe Great: Change dressing every other day. Other: - as needed Follow-up Appointments: Wound #1 Left Toe Great: Return Appointment in 1 week. Edema Control: Wound #1 Left Toe Great: Elevate legs to the level of the heart and pump ankles as often as possible Off-Loading: Wound #1 Left Toe Great: Open toe surgical shoe with peg assist. Additional Orders / Instructions: Wound #1 Left Toe Great: Stop Smoking Increase protein intake. Other: - use Betadine on the left 5th toe The following medication(s) was prescribed: lidocaine topical 4 % cream 1 1 cream topical was prescribed at facility Electronic Signature(s) Signed: 05/29/2018 6:35:31 PM By: Lawanda Cousins Previous Signature: 05/24/2018 3:15:12 PM Version By: Lawanda Cousins Entered By: Lawanda Cousins on 05/29/2018 18:35:31 Whitt, Lashona A. ([id number]) -------------------------------------------------------------------------------- SuperBill Details Patient Name: Heather Dear A. Date of Service: 05/24/2018 Medical Record Number:  [id number] Patient Account Number: [id number] Date of Birth/Sex: Jun 28, 1976 (42 y.o. F) Treating RN: Ahmed Prima Primary Care Provider: Suzan Garibaldi Other Clinician: Referring Provider: Suzan Garibaldi Treating Provider/Extender: Rene Kocher,  Taleah Bellantoni Weeks in Treatment: 18 Diagnosis Coding ICD-10 Codes Code Description E11.621 Type 2 diabetes mellitus with foot ulcer L97.521 Non-pressure chronic ulcer of other part of left foot limited to breakdown of skin Facility Procedures CPT4 Code Description: [id number] [id number] - DEBRIDE WOUND 1ST 20 SQ CM OR < ICD-10 Diagnosis Description L97.521 Non-pressure chronic ulcer of other part of left foot limited to Modifier: breakdown of s Quantity: 1 kin Physician Procedures CPT4 Code Description: [id number] - WC PHYS DEBR WO ANESTH 20 SQ CM ICD-10 Diagnosis Description L97.521 Non-pressure chronic ulcer of other part of left foot limited to Modifier: breakdown of sk Quantity: 1 in Electronic Signature(s) Signed: 05/24/2018 3:15:30 PM By: Lawanda Cousins Entered By: Lawanda Cousins on 05/24/2018 15:15:29

## 2018-06-07 ENCOUNTER — Ambulatory Visit
Admission: RE | Admit: 2018-06-07 | Discharge: 2018-06-07 | Disposition: A | Discharge status: Home / Self Care | Payer: Medicaid Other | Source: Ambulatory Visit | Attending: Nurse Practitioner | Admitting: Nurse Practitioner

## 2018-06-07 ENCOUNTER — Other Ambulatory Visit
Admission: RE | Admit: 2018-06-07 | Discharge: 2018-06-07 | Disposition: A | Discharge status: Home / Self Care | Payer: Medicaid Other | Source: Ambulatory Visit | Attending: *Deleted | Admitting: *Deleted

## 2018-06-07 ENCOUNTER — Encounter: Payer: Medicaid Other | Admitting: Nurse Practitioner

## 2018-06-07 ENCOUNTER — Other Ambulatory Visit: Payer: Self-pay | Admitting: Nurse Practitioner

## 2018-06-07 DIAGNOSIS — B999 Unspecified infectious disease: Secondary | ICD-10-CM | POA: Insufficient documentation

## 2018-06-07 DIAGNOSIS — E11621 Type 2 diabetes mellitus with foot ulcer: Secondary | ICD-10-CM | POA: Diagnosis not present

## 2018-06-10 LAB — AEROBIC CULTURE W GRAM STAIN (SUPERFICIAL SPECIMEN): Gram Stain: NONE SEEN

## 2018-06-11 NOTE — Progress Notes (Signed)
Heather, [name] ([id number]) Visit Report for 05/31/2018 Arrival Information Details Patient Name: [name], [name] Date of Service: 05/31/2018 2:30 PM Medical Record Number: [id number] Patient Account Number: [id number] Date of Birth/Sex: 1976-03-30 (42 y.o. F) Treating RN: Montey Hora Primary Care Treesa Mccully: Suzan Garibaldi Other Clinician: Referring Zephyra Bernardi: Suzan Garibaldi Treating Angelene Rome/Extender: Cathie Olden in Treatment: 20 Visit Information History Since Last Visit Added or deleted any medications: No Patient Arrived: Wheel Chair Any new allergies or adverse reactions: No Arrival Time: 14:43 Had a fall or experienced change in No activities of daily living that may affect Accompanied By: mother risk of falls: Transfer Assistance: None Signs or symptoms of abuse/neglect since last visito No Patient Identification Verified: Yes Hospitalized since last visit: No Secondary Verification Process Completed: Yes Implantable device outside of the clinic excluding No Patient Requires Transmission-Based No cellular tissue based products placed in the center Precautions: since last visit: Patient Has Alerts: Yes Has Dressing in Place as Prescribed: Yes Patient Alerts: DMII Pain Present Now: No Electronic Signature(s) Signed: 05/31/2018 4:27:39 PM By: Montey Hora Entered By: Montey Hora on 05/31/2018 14:43:28 [name] ([id number]) -------------------------------------------------------------------------------- Encounter Discharge Information Details Patient Name: Heather Dear A. Date of Service: 05/31/2018 2:30 PM Medical Record Number: [id number] Patient Account Number: [id number] Date of Birth/Sex: 08/17/1976 (42 y.o. F) Treating RN: Roger Shelter Primary Care Kately Graffam: Suzan Garibaldi Other Clinician: Referring Shelina Luo: Suzan Garibaldi Treating Jesselyn Rask/Extender: Cathie Olden in Treatment: 15 Encounter Discharge Information Items Discharge  Condition: Stable Ambulatory Status: Wheelchair Discharge Destination: Home Transportation: Private Auto Schedule Follow-up Appointment: Yes Clinical Summary of Care: Electronic Signature(s) Signed: 05/31/2018 4:42:12 PM By: Roger Shelter Entered By: Roger Shelter on 05/31/2018 15:14:12 Hellwig, Kynzi A. ([id number]) -------------------------------------------------------------------------------- Lower Extremity Assessment Details Patient Name: Heather Dear A. Date of Service: 05/31/2018 2:30 PM Medical Record Number: [id number] Patient Account Number: [id number] Date of Birth/Sex: February 11, 1976 (41 y.o. F) Treating RN: Montey Hora Primary Care Gaynor Ferreras: Suzan Garibaldi Other Clinician: Referring Abhi Moccia: Suzan Garibaldi Treating Tunisha Ruland/Extender: Cathie Olden in Treatment: 19 Vascular Assessment Pulses: Dorsalis Pedis Palpable: [Right:Yes] Posterior Tibial Extremity colors, hair growth, and conditions: Extremity Color: [Right:Normal] Hair Growth on Extremity: [Right:Yes] Temperature of Extremity: [Right:Warm] Capillary Refill: [Right:< 3 seconds] Toe Nail Assessment Left: Right: Thick: Yes Discolored: No Deformed: No Improper Length and Hygiene: No Electronic Signature(s) Signed: 05/31/2018 4:27:39 PM By: Montey Hora Entered By: Montey Hora on 05/31/2018 14:51:10 Ciampi, Juelz A. ([id number]) -------------------------------------------------------------------------------- Multi Wound Chart Details Patient Name: Heather Dear A. Date of Service: 05/31/2018 2:30 PM Medical Record Number: [id number] Patient Account Number: [id number] Date of Birth/Sex: 07-27-1976 (42 y.o. F) Treating RN: Ahmed Prima Primary Care Antonela Freiman: Suzan Garibaldi Other Clinician: Referring Tamani Durney: Suzan Garibaldi Treating Minha Fulco/Extender: Cathie Olden in Treatment: 19 Vital Signs Height(in): 69 Pulse(bpm): 99 Weight(lbs): 250 Blood Pressure(mmHg): 92/45 Body Mass  Index(BMI): 37 Temperature(F): 98.3 Respiratory Rate 16 (breaths/min): Photos: [1:No Photos] [N/A:N/A] Wound Location: [1:Left Toe Great] [N/A:N/A] Wounding Event: [1:Gradually Appeared] [N/A:N/A] Primary Etiology: [1:Diabetic Wound/Ulcer of the N/A Lower Extremity] Comorbid History: [1:Chronic Obstructive Pulmonary Disease (COPD), Congestive Heart Failure, Hypertension, Type II Diabetes, Neuropathy, Seizure Disorder] [N/A:N/A] Date Acquired: [1:01/01/2018] [N/A:N/A] Weeks of Treatment: [1:19] [N/A:N/A] Wound Status: [1:Open] [N/A:N/A] Pending Amputation on [1:Yes] [N/A:N/A] Presentation: Measurements L x W x D [1:0.3x0.2x0.2] [N/A:N/A] (cm) Area (cm) : [1:0.047] [N/A:N/A] Volume (cm) : [1:0.009] [N/A:N/A] % Reduction in Area: [1:99.40%] [N/A:N/A] % Reduction in Volume: [1:99.70%] [N/A:N/A] Classification: [1:Grade 2] [N/A:N/A] Exudate Amount: [1:Small] [N/A:N/A] Exudate Type: [1:Serosanguineous] [N/A:N/A] Exudate Color: [1:red, brown] [  N/A:N/A] Wound Margin: [1:Flat and Intact] [N/A:N/A] Granulation Amount: [1:Medium (34-66%)] [N/A:N/A] Granulation Quality: [1:Red] [N/A:N/A] Necrotic Amount: [1:Medium (34-66%)] [N/A:N/A] Necrotic Tissue: [1:Eschar, Adherent Slough] [N/A:N/A] Exposed Structures: [1:Fat Layer (Subcutaneous Tissue) Exposed: Yes Fascia: No Tendon: No Muscle: No] [N/A:N/A] Joint: No Bone: No Epithelialization: None N/A N/A Periwound Skin Texture: Callus: Yes N/A N/A Excoriation: No Induration: No Crepitus: No Rash: No Scarring: No Periwound Skin Moisture: Maceration: Yes N/A N/A Dry/Scaly: No Periwound Skin Color: Erythema: Yes N/A N/A Atrophie Blanche: No Cyanosis: No Ecchymosis: No Hemosiderin Staining: No Mottled: No Pallor: No Rubor: No Erythema Location: Circumferential N/A N/A Temperature: No Abnormality N/A N/A Tenderness on Palpation: No N/A N/A Wound Preparation: Ulcer Cleansing: N/A N/A Rinsed/Irrigated with Saline Topical  Anesthetic Applied: Other: lidocaine 4% Treatment Notes Electronic Signature(s) Signed: 05/31/2018 4:46:33 PM By: Alric Quan Entered By: Alric Quan on 05/31/2018 14:54:35 [name] ([id number]) -------------------------------------------------------------------------------- [place] Details Patient Name: Heather Dear A. Date of Service: 05/31/2018 2:30 PM Medical Record Number: [id number] Patient Account Number: [id number] Date of Birth/Sex: 15-Jul-1976 (42 y.o. F) Treating RN: Ahmed Prima Primary Care Parneet Glantz: Suzan Garibaldi Other Clinician: Referring Shuntay Everetts: Suzan Garibaldi Treating Dashaun Onstott/Extender: Cathie Olden in Treatment: 39 Active Inactive ` Abuse / Safety / Falls / Self Care Management Nursing Diagnoses: Potential for falls Goals: Patient will not experience any injury related to falls Date Initiated: 01/18/2018 Target Resolution Date: 04/28/2018 Goal Status: Active Interventions: Assess fall risk on admission and as needed Assess: immobility, friction, shearing, incontinence upon admission and as needed Assess impairment of mobility on admission and as needed per policy Assess personal safety and home safety (as indicated) on admission and as needed Assess self care needs on admission and as needed Notes: ` Nutrition Nursing Diagnoses: Imbalanced nutrition Impaired glucose control: actual or potential Potential for alteratiion in Nutrition/Potential for imbalanced nutrition Goals: Patient/caregiver agrees to and verbalizes understanding of need to use nutritional supplements and/or vitamins as prescribed Date Initiated: 01/18/2018 Target Resolution Date: 04/28/2018 Goal Status: Active Patient/caregiver will maintain therapeutic glucose control Date Initiated: 01/18/2018 Target Resolution Date: 04/28/2018 Goal Status: Active Interventions: Assess patient nutrition upon admission and as needed per policy Provide  education on elevated blood sugars and impact on wound healing Notes: BRIEL, GALLICCHIO ([id number]) Orientation to the Wound Care Program Nursing Diagnoses: Knowledge deficit related to the wound healing center program Goals: Patient/caregiver will verbalize understanding of the [place] Date Initiated: 01/18/2018 Target Resolution Date: 01/27/2018 Goal Status: Active Interventions: Provide education on orientation to the wound center Notes: ` Wound/Skin Impairment Nursing Diagnoses: Impaired tissue integrity Knowledge deficit related to smoking impact on wound healing Knowledge deficit related to ulceration/compromised skin integrity Goals: Ulcer/skin breakdown will have a volume reduction of 80% by week 12 Date Initiated: 01/18/2018 Target Resolution Date: 04/21/2018 Goal Status: Active Interventions: Assess patient/caregiver ability to perform ulcer/skin care regimen upon admission and as needed Assess ulceration(s) every visit Notes: Electronic Signature(s) Signed: 05/31/2018 4:46:33 PM By: Alric Quan Entered By: Alric Quan on 05/31/2018 14:54:14 Whaling, Tekeshia A. ([id number]) -------------------------------------------------------------------------------- Pain Assessment Details Patient Name: Heather Dear A. Date of Service: 05/31/2018 2:30 PM Medical Record Number: [id number] Patient Account Number: [id number] Date of Birth/Sex: [birth year] (42 y.o. F) Treating RN: Montey Hora Primary Care Caedyn Raygoza: Suzan Garibaldi Other Clinician: Referring Shavon Zenz: Suzan Garibaldi Treating Nancee Brownrigg/Extender: Cathie Olden in Treatment: 19 Active Problems Location of Pain Severity and Description of Pain Patient Has Paino No Site Locations Pain Management and Medication Current Pain Management: Electronic  Signature(s) Signed: 05/31/2018 4:27:39 PM By: Montey Hora Entered By: Montey Hora on 05/31/2018 14:45:28 [name]  ([id number]) -------------------------------------------------------------------------------- Patient/Caregiver Education Details Patient Name: Heather Dear A. Date of Service: 05/31/2018 2:30 PM Medical Record Number: [id number] Patient Account Number: [id number] Date of Birth/Gender: 1976-02-16 (42 y.o. F) Treating RN: Roger Shelter Primary Care Physician: Suzan Garibaldi Other Clinician: Referring Physician: Suzan Garibaldi Treating Physician/Extender: Cathie Olden in Treatment: 29 Education Assessment Education Provided To: Patient Education Topics Provided Wound/Skin Impairment: Handouts: Caring for Your Ulcer Methods: Printed Responses: State content correctly Electronic Signature(s) Signed: 05/31/2018 4:42:12 PM By: Roger Shelter Entered By: Roger Shelter on 05/31/2018 15:14:24 Deguire, Shalyn AMarland Kitchen ([id number]) -------------------------------------------------------------------------------- Wound Assessment Details Patient Name: Heather Dear A. Date of Service: 05/31/2018 2:30 PM Medical Record Number: [id number] Patient Account Number: [id number] Date of Birth/Sex: 05/28/1976 (42 y.o. F) Treating RN: Montey Hora Primary Care Derek Huneycutt: Suzan Garibaldi Other Clinician: Referring Otho Michalik: Suzan Garibaldi Treating Leith Szafranski/Extender: Cathie Olden in Treatment: 19 Wound Status Wound Number: 1 Primary Diabetic Wound/Ulcer of the Lower Extremity Etiology: Wound Location: Left Toe Great Wound Open Wounding Event: Gradually Appeared Status: Date Acquired: 01/01/2018 Comorbid Chronic Obstructive Pulmonary Disease Weeks Of Treatment: 19 History: (COPD), Congestive Heart Failure, Clustered Wound: No Hypertension, Type II Diabetes, Neuropathy, Pending Amputation On Presentation Seizure Disorder Photos Photo Uploaded By: Montey Hora on 05/31/2018 15:11:09 Wound Measurements Length: (cm) 0.3 Width: (cm) 0.2 Depth: (cm) 0.2 Area: (cm) 0.047 Volume: (cm)  0.009 % Reduction in Area: 99.4% % Reduction in Volume: 99.7% Epithelialization: None Tunneling: No Undermining: No Wound Description Classification: Grade 2 Wound Margin: Flat and Intact Exudate Amount: Small Exudate Type: Serosanguineous Exudate Color: red, brown Foul Odor After Cleansing: No Slough/Fibrino Yes Wound Bed Granulation Amount: Medium (34-66%) Exposed Structure Granulation Quality: Red Fascia Exposed: No Necrotic Amount: Medium (34-66%) Fat Layer (Subcutaneous Tissue) Exposed: Yes Necrotic Quality: Eschar, Adherent Slough Tendon Exposed: No Muscle Exposed: No Joint Exposed: No Bone Exposed: No Eberlein, Maddi A. ([id number]) Periwound Skin Texture Texture Color No Abnormalities Noted: No No Abnormalities Noted: No Callus: Yes Atrophie Blanche: No Crepitus: No Cyanosis: No Excoriation: No Ecchymosis: No Induration: No Erythema: Yes Rash: No Erythema Location: Circumferential Scarring: No Hemosiderin Staining: No Mottled: No Moisture Pallor: No No Abnormalities Noted: No Rubor: No Dry / Scaly: No Maceration: Yes Temperature / Pain Temperature: No Abnormality Wound Preparation Ulcer Cleansing: Rinsed/Irrigated with Saline Topical Anesthetic Applied: Other: lidocaine 4%, Treatment Notes Wound #1 (Left Toe Great) 1. Cleansed with: Clean wound with Normal Saline 2. Anesthetic Topical Lidocaine 4% cream to wound bed prior to debridement 4. Dressing Applied: Prisma Ag 5. Secondary Dressing Applied Bordered Foam Dressing Electronic Signature(s) Signed: 05/31/2018 4:27:39 PM By: Montey Hora Entered By: Montey Hora on 05/31/2018 14:50:50 [name] ([id number]) -------------------------------------------------------------------------------- Vitals Details Patient Name: Heather Dear A. Date of Service: 05/31/2018 2:30 PM Medical Record Number: [id number] Patient Account Number: [id number] Date of Birth/Sex: [birth year] (42 y.o.  F) Treating RN: Montey Hora Primary Care Aleeah Greeno: Suzan Garibaldi Other Clinician: Referring Audon Heymann: Suzan Garibaldi Treating Jung Ingerson/Extender: Cathie Olden in Treatment: 19 Vital Signs Time Taken: 14:45 Temperature (F): 98.3 Height (in): 69 Pulse (bpm): 99 Weight (lbs): 250 Respiratory Rate (breaths/min): 16 Body Mass Index (BMI): 36.9 Blood Pressure (mmHg): 92/45 Reference Range: 80 - 120 mg / dl Electronic Signature(s) Signed: 05/31/2018 4:27:39 PM By: Montey Hora Entered By: Montey Hora on 05/31/2018 14:45:47

## 2018-06-11 NOTE — Progress Notes (Signed)
LEVINA, BOYACK ([id number]) Visit Report for 05/31/2018 Chief Complaint Document Details Patient Name: [name], [name] Date of Service: 05/31/2018 2:30 PM Medical Record Number: [id number] Patient Account Number: [id number] Date of Birth/Sex: [birth year] (42 y.o. F) Treating RN: Ahmed Prima Primary Care Provider: Suzan Garibaldi Other Clinician: Referring Provider: Suzan Garibaldi Treating Provider/Extender: Cathie Olden in Treatment: 69 Information Obtained from: Patient Chief Complaint She is here for evaluation of a left great toe ulcer Electronic Signature(s) Signed: 05/31/2018 3:00:30 PM By: Lawanda Cousins Entered By: Lawanda Cousins on 05/31/2018 15:00:30 [name] ([id number]) -------------------------------------------------------------------------------- Debridement Details Patient Name: Heather Dear A. Date of Service: 05/31/2018 2:30 PM Medical Record Number: [id number] Patient Account Number: [id number] Date of Birth/Sex: September 06, 1976 (42 y.o. F) Treating RN: Ahmed Prima Primary Care Provider: Suzan Garibaldi Other Clinician: Referring Provider: Suzan Garibaldi Treating Provider/Extender: Cathie Olden in Treatment: 19 Debridement Performed for Wound #1 Left Toe Great Assessment: Performed By: Physician Lawanda Cousins, NP Debridement Type: Debridement Severity of Tissue Pre Limited to breakdown of skin Debridement: Pre-procedure Verification/Time Yes - 14:54 Out Taken: Start Time: 14:54 Pain Control: Other : lidocaine 4% Total Area Debrided (L x W): 0.3 (cm) x 0.2 (cm) = 0.06 (cm) Tissue and other material Viable, Non-Viable, Callus, Skin: Dermis , Fibrin/Exudate debrided: Level: Skin/Dermis Debridement Description: Selective/Open Wound Instrument: Curette Bleeding: Minimum Hemostasis Achieved: Pressure End Time: 14:58 Procedural Pain: 0 Post Procedural Pain: 0 Response to Treatment: Procedure was tolerated well Level of Consciousness:  Awake and Alert Post Debridement Measurements of Total Wound Length: (cm) 0.4 Width: (cm) 0.3 Depth: (cm) 0.3 Volume: (cm) 0.028 Character of Wound/Ulcer Post Debridement: Requires Further Debridement Severity of Tissue Post Debridement: Limited to breakdown of skin Post Procedure Diagnosis Same as Pre-procedure Electronic Signature(s) Signed: 05/31/2018 3:00:20 PM By: Lawanda Cousins Signed: 05/31/2018 4:46:33 PM By: Alric Quan Entered By: Lawanda Cousins on 05/31/2018 15:00:20 Heather Dear A. ([id number]) -------------------------------------------------------------------------------- HPI Details Patient Name: Heather Dear A. Date of Service: 05/31/2018 2:30 PM Medical Record Number: [id number] Patient Account Number: [id number] Date of Birth/Sex: November 02, 1975 (42 y.o. F) Treating RN: Ahmed Prima Primary Care Provider: Suzan Garibaldi Other Clinician: Referring Provider: Suzan Garibaldi Treating Provider/Extender: Cathie Olden in Treatment: 19 History of Present Illness HPI Description: 01/18/18-She is here for initial evaluation of the left great toe ulcer. She is a poor historian in regards to timeframe in detail. She states approximately 4 weeks ago she lacerated her toe on something in the house. She followed up with her primary care who placed her on Bactrim and ultimately a second dose of Bactrim prior to coming to wound clinic. She states she has been treating the toe with peroxide, Betadine and a Band-Aid. She did not check her blood sugar this morning but checked it yesterday morning it was 327; she is unaware of a recent A1c and there are no current records. She saw Dr. she would've orthopedics last week for an old injury to the left ankle, she states he did not see her toe, nor did she bring it to his attention. She smokes approximately 1 pack cigarettes a day. Her social situation is concerning, she arrives this morning with her mother who appears extremely  intoxicated/under the influence; her mother was asked to leave the room and be monitored by the patient's grandmother. The patient's aunt then accompanied the patient and the room throughout the rest of the appointment. We had a lengthy discussion regarding the deleterious effects of uncontrolled hyperglycemia and smoking as it relates to wound  healing and overall health. She was strongly encouraged to decrease her smoking and get her diabetes under better control. She states she is currently on a diet and has cut down her [facility] consumption. The left toe is erythematous, macerated and slightly edematous with malodor present. The edema in her left foot is below her baseline, there is no erythema streaking. We will treat her with Santyl, doxycycline; we have ordered and xray, culture and provided a Peg assist surgical shoe and cultured the wound. 01/25/18-She is here in follow-up evaluation for a left great toe ulcer and presents with an abscess to her suprapubic area. She states her blood sugars remain elevated, feeling "sick" and if levels are below 250, but she is trying. She has made no attempt to decrease her smoking stating that we "can't take away her food in her cigarettes". She has been compliant with offloading using the PEG assist you. She is using Santyl daily. the culture obtained last week grew staph aureus and Enterococcus faecalis; continues on the doxycycline and Augmentin was added on Monday. The suprapubic area has erythema, no femoral variation, purple discoloration, minimal induration, was accessed with a cotton tip applicator with sanguinopurulent drainage, this was cultured, I suspect the current antibiotic treatment will cover and we will not add anything to her current treatment plan. She was advised to go to urgent care or ER with any change in redness, induration or fever. 02/01/18-She is here in follow-up evaluation for left great toe ulcers and a new abdominal abscess  from last week. She was able to use packing until earlier this week, where she "forgot it was there". She states she was feeling ill with GI symptoms last week and was not taking her antibiotic. She states her glucose levels have been predominantly less than 200, with occasional levels between 200-250. She thinks this was contributing to her GI symptoms as they have resolved without intervention. There continues to be significant laceration to left toe, otherwise it clinically looks stable/improved. There is now less superficial opening to the lateral aspect of the great toe that was residual blister. We will transition to [facility] to all wounds, she will continue her Augmentin. If there is no change or deterioration next week for reculture. 02/08/18-She is here in follow-up evaluation for left great toe ulcer and abdominal ulcer. There is an improvement in both wounds. She has been wrapping her left toe with coban, not by our direction, which has created an area of discoloration to the medial aspect; she has been advised to NOT use coban secondary to her neuropathy. She states her glucose levels have been high over this last week ranging from 200-350, she continues to smoke. She admits to being less compliant with her offloading shoe. We will continue with same treatment plan and she will follow-up next week. 02/15/18-She is here in follow-up evaluation for left great toe ulcer and abdominal ulcer. The abdominal ulcer is epithelialized. The left great toe ulcer is improved and all injury from last week using the Coban wrap is resolved, the lateral ulcer is healed. She admits to noncompliance with wearing offloading shoe and admits to glucose levels being greater than 300 most of the week. She continues to smoke and expresses no desire to quit. There is one area medially that probes deeper than it has historically, erythema to the toe and dorsal foot has consistently waxed and waned. There is no  overt signs of cellulitis or infection but we will culture the wound for  any occult infection given the new area of depth and erythema. We will hold off on sensitivities for initiation of antibiotic therapy. 02/22/18-She is here in follow up evaluation for left great toe ulcer. There is overall significant improvement in both wound appearance, erythema and edema with changes made last week. She was not initiated on antibiotic therapy. Culture obtained last week showed oxacillin sensitive staph aureus, sensitive to clindamycin. Clindamycin has been called into the pharmacy but she has been instructed to hold off on initiation secondary to overall clinical improvement and her history of antibiotic intolerance. She has been instructed to contact the clinic with any noted changes/deterioration and the wound, erythema, Kratt, Heaven A. ([id number]) edema and/or pain. She will follow-up next week. She continues to smoke and her glucose levels remain elevated >250; she admits to compliance with offloading shoe 03/01/18 on evaluation today patient appears to be doing fairly well in regard to her left first toe ulcer. She has been tolerating the dressing changes with the [employer] Dressing without complication and overall this has definitely showed signs of improvement according to records as well is what the patient tells me today. I'm very pleased in that regard. She is having no pain today 03/08/18 She is here for follow up evaluation of a left great toe ulcer. She remains non-compliant with glucose control and smoking cessation; glucose levels consistently >200. She states that she got new shoe inserts/peg assist. She admits to compliance with offloading. Since my last evaluation there is significant improvement. We will switch to prisma at this time and she will follow up next week. She is noted to be tachycardic at this appointment, heart rate 120s; she has a history of heart rate 70-130 according to  our records. She admits to extreme agitation r/t personal issues; she was advised to monitor her heartrate and contact her physician if it does not return to a more normal range (<100). She takes cardizem twice daily. 03/15/18-She is here in follow-up evaluation for left great toe ulcer. She remains noncompliant with glucose control and smoking cessation. She admits to compliance with wearing offloading shoe. The ulcer is improved/stable and we will continue with the same treatment plan and she will follow-up next week 03/22/18-She is here for evaluation for left great toe ulcer. There continues to be significant improvement despite recurrent hyperglycemia (over 500 yesterday) and she continues to smoke. She has been compliant with offloading and we will continue with same treatment plan and she will follow-up next week. 03/29/18-She is here for evaluation for left great toe ulcer. Despite continuing to smoke and uncontrolled diabetes she continues to improve. She is compliant with offloading shoe. We will continue with the same treatment plan and she will follow-up next week 04/05/18- She is here in follow up evaluation for a left great toe ulcer; she presents with small pustule to left fifth toe (resembles ant bite). She admits to compliance with wearing offloading shoe; continues to smoke or have uncontrolled blood glucose control. There is more callus than usual with evidence of bleeding; she denies known trauma. 04/12/18-She is here for evaluation of left great toe ulcer. Despite noncompliance with glycemic control and smoking she continues to make improvement. She continues to wear offloading shoe. The pustule, that was identified last week, to the left fifth toe is resolved. She will follow-up in 2 weeks 05/03/18-she is seen in follow-up evaluation for a left great toe ulcer. She is compliant with offloading, otherwise noncompliant with glycemic control and smoking. She  has plateaued and there is  minimal improvement noted. We will transition to [facility], replaced the insert to her surgical shoe and she will follow-up in one week 05/10/18- She is here in follow up evaluation for a left great toe ulcer. It appears stable despite measurement change. We will continue with same treatment plan and follow up next week. 05/24/18-She is seen in follow-up evaluation for a left great toe ulcer. She remains compliant with offloading, has made significant improvement in her diet, decreasing the amount of sugar/soda. She said her recent A1c was 10.9 which is lower than. She did see a diabetic nutritionist/educator yesterday. She continues to smoke. We will continue with the same treatment plan and she'll follow-up next week. 05/31/18- She is seen in follow-up medication for left great toe ulcer. She continues to remain compliant with offloading, continues to make improvement in her diet, increasing her water and decreasing the amount of sugar/soda. She does continue to smoke with no desire to quit. We will apply Prisma to the depth and Hydrofera Blue over. We have not received insurance authorization for oasis. She will follow up next week. Electronic Signature(s) Signed: 05/31/2018 3:22:20 PM By: Lawanda Cousins Entered By: Lawanda Cousins on 05/31/2018 15:22:20 [name] ([id number]) -------------------------------------------------------------------------------- Physician Orders Details Patient Name: Heather Dear A. Date of Service: 05/31/2018 2:30 PM Medical Record Number: [id number] Patient Account Number: [id number] Date of Birth/Sex: Jul 14, 1976 (42 y.o. F) Treating RN: Ahmed Prima Primary Care Provider: Suzan Garibaldi Other Clinician: Referring Provider: Suzan Garibaldi Treating Provider/Extender: Cathie Olden in Treatment: 4 Verbal / Phone Orders: Yes Clinician: Carolyne Fiscal, Debi Read Back and Verified: Yes Diagnosis Coding Wound Cleansing Wound #1 Left Toe Great o Clean  wound with Normal Saline. o Cleanse wound with mild soap and water o May Shower, gently pat wound dry prior to applying new dressing. Anesthetic (add to Medication List) Wound #1 Left Toe Great o Topical Lidocaine 4% cream applied to wound bed prior to debridement (In Clinic Only). Primary Wound Dressing Wound #1 Left Toe Great o Silver Collagen - slightly moisten with saline Secondary Dressing Wound #1 Left Toe Great o Dry Gauze o Conform/Kerlix Dressing Change Frequency Wound #1 Left Toe Great o Change dressing every other day. o Other: - as needed Follow-up Appointments Wound #1 Left Toe Great o Return Appointment in 1 week. Edema Control Wound #1 Left Toe Great o Elevate legs to the level of the heart and pump ankles as often as possible Off-Loading Wound #1 Left Toe Great o Open toe surgical shoe with peg assist. Additional Orders / Instructions Wound #1 Left Toe Great o Stop Smoking o Increase protein intake. o Other: - use Betadine on the left 5th toe [name], [name] ([id number]) Patient Medications Allergies: erythromycin base Notifications Medication Indication Start End lidocaine DOSE 1 - topical 4 % cream - 1 cream topical Electronic Signature(s) Signed: 05/31/2018 4:46:33 PM By: Alric Quan Signed: 05/31/2018 5:08:35 PM By: Lawanda Cousins Entered By: Alric Quan on 05/31/2018 14:59:13 [name] ([id number]) -------------------------------------------------------------------------------- Prescription 05/31/2018 Patient Name: Heather Dear A. Provider: Lawanda Cousins NP Date of Birth: [birth year] NPI#: [id number] Sex: F DEA#: [id number] Phone #: [phone] License #: Patient Address: [place], [place] [address] [address], [place], [place] [address] [phone] Allergies erythromycin  base Medication Medication: Route: Strength: Form: lidocaine topical 4% cream Class: TOPICAL LOCAL ANESTHETICS Dose: Frequency / Time: Indication: 1 1 cream topical  Number of Refills: Number of Units: 0 Generic Substitution: Start Date: End Date: Administered at [facility]: Yes Time Administered: Time Discontinued: Note to Pharmacy: Signature(s): Date(s): Electronic Signature(s) Signed: 05/31/2018 4:46:33 PM By: Alric Quan Signed: 05/31/2018 5:08:35 PM By: Lawanda Cousins Entered By: Alric Quan on 05/31/2018 14:59:13 [name] ([id number]) [name], [name] ([id number]) --------------------------------------------------------------------------------  Problem List Details Patient Name: Heather Dear A. Date of Service: 05/31/2018 2:30 PM Medical Record Number: [id number] Patient Account Number: [id number] Date of Birth/Sex: 1976/07/06 (42 y.o. 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 ([id number]) -------------------------------------------------------------------------------- Progress Note Details Patient Name: Heather Dear A. Date of Service:  05/31/2018 2:30 PM Medical Record Number: [id number] Patient Account Number: [id number] Date of Birth/Sex: [birth year] (42 y.o. F) Treating RN: Ahmed Prima Primary Care Provider: Suzan Garibaldi Other Clinician: Referring Provider: Suzan Garibaldi Treating Provider/Extender: Cathie Olden in Treatment: 19 Subjective Chief Complaint Information obtained from Patient She is here for evaluation of a left great toe ulcer History of Present Illness (HPI) 01/18/18-She is here for initial evaluation of the left great toe ulcer. She is a poor historian in regards to timeframe in detail. She states approximately 4 weeks ago she lacerated her toe on something in the house. She followed up with her primary care who placed her on Bactrim and ultimately a second dose of Bactrim prior to coming to wound clinic. She states she has been treating the toe with peroxide, Betadine and a Band-Aid. She did not check her blood sugar this morning but checked it yesterday morning it was 327; she is unaware of a recent A1c and there are no current records. She saw Dr. she would've orthopedics last week for an old injury to the left ankle, she states he did not see her toe, nor did she bring it to his attention. She smokes approximately 1 pack cigarettes a day. Her social situation is concerning, she arrives this morning with her mother who appears extremely intoxicated/under the influence; her mother was asked to leave the room and be monitored by the patient's grandmother. The patient's aunt then accompanied the patient and the room throughout the rest of the appointment. We had a lengthy discussion regarding the deleterious effects of uncontrolled hyperglycemia and smoking as it relates to wound healing and overall health. She was strongly encouraged to decrease her smoking and get her diabetes under better control. She states she is currently on a diet and has cut down her [facility] consumption. The left toe  is erythematous, macerated and slightly edematous with malodor present. The edema in her left foot is below her baseline, there is no erythema streaking. We will treat her with Santyl, doxycycline; we have ordered and xray, culture and provided a Peg assist surgical shoe and cultured the wound. 01/25/18-She is here in follow-up evaluation for a left great toe ulcer and presents with  an abscess to her suprapubic area. She states her blood sugars remain elevated, feeling "sick" and if levels are below 250, but she is trying. She has made no attempt to decrease her smoking stating that we "can't take away her food in her cigarettes". She has been compliant with offloading using the PEG assist you. She is using Santyl daily. the culture obtained last week grew staph aureus and Enterococcus faecalis; continues on the doxycycline and Augmentin was added on Monday. The suprapubic area has erythema, no femoral variation, purple discoloration, minimal induration, was accessed with a cotton tip applicator with sanguinopurulent drainage, this was cultured, I suspect the current antibiotic treatment will cover and we will not add anything to her current treatment plan. She was advised to go to urgent care or ER with any change in redness, induration or fever. 02/01/18-She is here in follow-up evaluation for left great toe ulcers and a new abdominal abscess from last week. She was able to use packing until earlier this week, where she "forgot it was there". She states she was feeling ill with GI symptoms last week and was not taking her antibiotic. She states her glucose levels have been predominantly less than 200, with occasional levels between 200-250. She thinks this was contributing to her GI symptoms as they have resolved without intervention. There continues to be significant laceration to left toe, otherwise it clinically looks stable/improved. There is now less superficial opening to the lateral aspect of  the great toe that was residual blister. We will transition to [facility] to all wounds, she will continue her Augmentin. If there is no change or deterioration next week for reculture. 02/08/18-She is here in follow-up evaluation for left great toe ulcer and abdominal ulcer. There is an improvement in both wounds. She has been wrapping her left toe with coban, not by our direction, which has created an area of discoloration to the medial aspect; she has been advised to NOT use coban secondary to her neuropathy. She states her glucose levels have been high over this last week ranging from 200-350, she continues to smoke. She admits to being less compliant with her offloading shoe. We will continue with same treatment plan and she will follow-up next week. 02/15/18-She is here in follow-up evaluation for left great toe ulcer and abdominal ulcer. The abdominal ulcer is epithelialized. The left great toe ulcer is improved and all injury from last week using the Coban wrap is resolved, the lateral ulcer is healed. She admits to noncompliance with wearing offloading shoe and admits to glucose levels being greater than 300 most of the week. She continues to smoke and expresses no desire to quit. There is one area medially that probes deeper than it has historically, erythema to the toe and dorsal foot has consistently waxed and waned. There is no overt signs of cellulitis or infection but we will culture the wound for any occult infection given the new area of depth and erythema. We will hold off on [name], [name] ([id number]) sensitivities for initiation of antibiotic therapy. 02/22/18-She is here in follow up evaluation for left great toe ulcer. There is overall significant improvement in both wound appearance, erythema and edema with changes made last week. She was not initiated on antibiotic therapy. Culture obtained last week showed oxacillin sensitive staph aureus, sensitive to clindamycin.  Clindamycin has been called into the pharmacy but she has been instructed to hold off on initiation secondary to overall clinical improvement and her history of antibiotic  intolerance. She has been instructed to contact the clinic with any noted changes/deterioration and the wound, erythema, edema and/or pain. She will follow-up next week. She continues to smoke and her glucose levels remain elevated >250; she admits to compliance with offloading shoe 03/01/18 on evaluation today patient appears to be doing fairly well in regard to her left first toe ulcer. She has been tolerating the dressing changes with the [facility] Dressing without complication and overall this has definitely showed signs of improvement according to records as well is what the patient tells me today. I'm very pleased in that regard. She is having no pain today 03/08/18 She is here for follow up evaluation of a left great toe ulcer. She remains non-compliant with glucose control and smoking cessation; glucose levels consistently >200. She states that she got new shoe inserts/peg assist. She admits to compliance with offloading. Since my last evaluation there is significant improvement. We will switch to prisma at this time and she will follow up next week. She is noted to be tachycardic at this appointment, heart rate 120s; she has a history of heart rate 70-130 according to our records. She admits to extreme agitation r/t personal issues; she was advised to monitor her heartrate and contact her physician if it does not return to a more normal range (<100). She takes cardizem twice daily. 03/15/18-She is here in follow-up evaluation for left great toe ulcer. She remains noncompliant with glucose control and smoking cessation. She admits to compliance with wearing offloading shoe. The ulcer is improved/stable and we will continue with the same treatment plan and she will follow-up next week 03/22/18-She is here for evaluation  for left great toe ulcer. There continues to be significant improvement despite recurrent hyperglycemia (over 500 yesterday) and she continues to smoke. She has been compliant with offloading and we will continue with same treatment plan and she will follow-up next week. 03/29/18-She is here for evaluation for left great toe ulcer. Despite continuing to smoke and uncontrolled diabetes she continues to improve. She is compliant with offloading shoe. We will continue with the same treatment plan and she will follow-up next week 04/05/18- She is here in follow up evaluation for a left great toe ulcer; she presents with small pustule to left fifth toe (resembles ant bite). She admits to compliance with wearing offloading shoe; continues to smoke or have uncontrolled blood glucose control. There is more callus than usual with evidence of bleeding; she denies known trauma. 04/12/18-She is here for evaluation of left great toe ulcer. Despite noncompliance with glycemic control and smoking she continues to make improvement. She continues to wear offloading shoe. The pustule, that was identified last week, to the left fifth toe is resolved. She will follow-up in 2 weeks 05/03/18-she is seen in follow-up evaluation for a left great toe ulcer. She is compliant with offloading, otherwise noncompliant with glycemic control and smoking. She has plateaued and there is minimal improvement noted. We will transition to [facility], replaced the insert to her surgical shoe and she will follow-up in one week 05/10/18- She is here in follow up evaluation for a left great toe ulcer. It appears stable despite measurement change. We will continue with same treatment plan and follow up next week. 05/24/18-She is seen in follow-up evaluation for a left great toe ulcer. She remains compliant with offloading, has made significant improvement in her diet, decreasing the amount of sugar/soda. She said her recent A1c was 10.9 which  is lower than.  She did see a diabetic nutritionist/educator yesterday. She continues to smoke. We will continue with the same treatment plan and she'll follow-up next week. 05/31/18- She is seen in follow-up medication for left great toe ulcer. She continues to remain compliant with offloading, continues to make improvement in her diet, increasing her water and decreasing the amount of sugar/soda. She does continue to smoke with no desire to quit. We will apply Prisma to the depth and Hydrofera Blue over. We have not received insurance authorization for oasis. She will follow up next week. Objective Constitutional [name], [name] ([id number]) Vitals Time Taken: 2:45 PM, Height: 69 in, Weight: 250 lbs, BMI: 36.9, Temperature: 98.3 F, Pulse: 99 bpm, Respiratory Rate: 16 breaths/min, Blood Pressure: 92/45 mmHg. Integumentary (Hair, Skin) Wound #1 status is Open. Original cause of wound was Gradually Appeared. The wound is located on the Left Toe Great. The wound measures 0.3cm length x 0.2cm width x 0.2cm depth; 0.047cm^2 area and 0.009cm^3 volume. There is Fat Layer (Subcutaneous Tissue) Exposed exposed. There is no tunneling or undermining noted. There is a small amount of serosanguineous drainage noted. The wound margin is flat and intact. There is medium (34-66%) red granulation within the wound bed. There is a medium (34-66%) amount of necrotic tissue within the wound bed including Eschar and Adherent Slough. The periwound skin appearance exhibited: Callus, Maceration, Erythema. The periwound skin appearance did not exhibit: Crepitus, Excoriation, Induration, Rash, Scarring, Dry/Scaly, Atrophie Blanche, Cyanosis, Ecchymosis, Hemosiderin Staining, Mottled, Pallor, Rubor. The surrounding wound skin color is noted with erythema which is circumferential. Periwound temperature was noted as No Abnormality. Assessment Active Problems ICD-10 Type 2 diabetes mellitus with foot ulcer Non-pressure  chronic ulcer of other part of left foot limited to breakdown of skin Procedures Wound #1 Pre-procedure diagnosis of Wound #1 is a Diabetic Wound/Ulcer of the Lower Extremity located on the Left Toe Great .Severity of Tissue Pre Debridement is: Limited to breakdown of skin. There was a Selective/Open Wound Skin/Dermis Debridement with a total area of 0.06 sq cm performed by Lawanda Cousins, NP. With the following instrument(s): Curette to remove Viable and Non-Viable tissue/material. Material removed includes Callus, Skin: Dermis, and Fibrin/Exudate after achieving pain control using Other (lidocaine 4%). No specimens were taken. A time out was conducted at 14:54, prior to the start of the procedure. A Minimum amount of bleeding was controlled with Pressure. The procedure was tolerated well with a pain level of 0 throughout and a pain level of 0 following the procedure. Patient s Level of Consciousness post procedure was recorded as Awake and Alert. Post Debridement Measurements: 0.4cm length x 0.3cm width x 0.3cm depth; 0.028cm^3 volume. Character of Wound/Ulcer Post Debridement requires further debridement. Severity of Tissue Post Debridement is: Limited to breakdown of skin. Post procedure Diagnosis Wound #1: Same as Pre-Procedure Plan Wound Cleansing: Wound #1 Left Toe Great: Clean wound with Normal Saline. Cleanse wound with mild soap and water [name], [name] ([id number]) May Shower, gently pat wound dry prior to applying new dressing. Anesthetic (add to Medication List): Wound #1 Left Toe Great: Topical Lidocaine 4% cream applied to wound bed prior to debridement (In Clinic Only). Primary Wound Dressing: Wound #1 Left Toe Great: Silver Collagen - slightly moisten with saline Secondary Dressing: Wound #1 Left Toe Great: Dry Gauze Conform/Kerlix Dressing Change Frequency: Wound #1 Left Toe Great: Change dressing every other day. Other: - as needed Follow-up  Appointments: Wound #1 Left Toe Great: Return Appointment in 1 week. Edema Control: Wound #1  Left Toe Great: Elevate legs to the level of the heart and pump ankles as often as possible Off-Loading: Wound #1 Left Toe Great: Open toe surgical shoe with peg assist. Additional Orders / Instructions: Wound #1 Left Toe Great: Stop Smoking Increase protein intake. Other: - use Betadine on the left 5th toe The following medication(s) was prescribed: lidocaine topical 4 % cream 1 1 cream topical was prescribed at facility Electronic Signature(s) Signed: 05/31/2018 3:23:33 PM By: Lawanda Cousins Entered By: Lawanda Cousins on 05/31/2018 15:23:32 [name], [name] ([id number]) -------------------------------------------------------------------------------- SuperBill Details Patient Name: Heather Dear A. Date of Service: 05/31/2018 Medical Record Number: [id number] Patient Account Number: [id number] Date of Birth/Sex: 1976/10/08 (42 y.o. F) Treating RN: Ahmed Prima Primary Care Provider: Suzan Garibaldi Other Clinician: Referring Provider: Suzan Garibaldi Treating Provider/Extender: Cathie Olden in Treatment: 19 Diagnosis Coding ICD-10 Codes Code Description E11.621 Type 2 diabetes mellitus with foot ulcer L97.521 Non-pressure chronic ulcer of other part of left foot limited to breakdown of skin Facility Procedures CPT4 Code Description: [id number] - DEBRIDE WOUND 1ST 20 SQ CM OR < ICD-10 Diagnosis Description L97.521 Non-pressure chronic ulcer of other part of left foot limited to Modifier: breakdown of s Quantity: 1 kin Physician Procedures CPT4 Code Description: [id number] - WC PHYS DEBR WO ANESTH 20 SQ CM ICD-10 Diagnosis Description L97.521 Non-pressure chronic ulcer of other part of left foot limited to Modifier: breakdown of sk Quantity: 1 in Electronic Signature(s) Signed: 05/31/2018 3:23:45 PM By: Lawanda Cousins Entered By: Lawanda Cousins on 05/31/2018 15:23:44

## 2018-06-14 ENCOUNTER — Encounter: Payer: Medicaid Other | Admitting: Nurse Practitioner

## 2018-06-14 DIAGNOSIS — E11621 Type 2 diabetes mellitus with foot ulcer: Secondary | ICD-10-CM | POA: Diagnosis not present

## 2018-06-21 ENCOUNTER — Encounter: Payer: Medicaid Other | Admitting: Nurse Practitioner

## 2018-06-21 DIAGNOSIS — E11621 Type 2 diabetes mellitus with foot ulcer: Secondary | ICD-10-CM | POA: Diagnosis not present

## 2018-06-21 NOTE — Progress Notes (Signed)
LOU, IRIGOYEN ([id number]) Visit Report for 06/07/2018 Chief Complaint Document Details Patient Name: [name], [name] Date of Service: 06/07/2018 11:00 AM Medical Record Number: [id number] Patient Account Number: [id number] Date of Birth/Sex: 11-Jun-1976 (42 y.o. F) Treating RN: Roger Shelter Primary Care Provider: Suzan Garibaldi Other Clinician: Referring Provider: Suzan Garibaldi Treating Provider/Extender: Cathie Olden in Treatment: 20 Information Obtained from: Patient Chief Complaint She is here for evaluation of a left great toe ulcer Electronic Signature(s) Signed: 06/07/2018 11:17:20 AM By: Lawanda Cousins Entered By: Lawanda Cousins on 06/07/2018 11:17:20 [name] ([id number]) -------------------------------------------------------------------------------- Debridement Details Patient Name: Heather Dear A. Date of Service: 06/07/2018 11:00 AM Medical Record Number: [id number] Patient Account Number: [id number] Date of Birth/Sex: 1976/03/14 (42 y.o. F) Treating RN: Roger Shelter Primary Care Provider: Suzan Garibaldi Other Clinician: Referring Provider: Suzan Garibaldi Treating Provider/Extender: Cathie Olden in Treatment: 20 Debridement Performed for Wound #1 Left Toe Great Assessment: Performed By: Physician Lawanda Cousins, NP Debridement Type: Debridement Severity of Tissue Pre Fat layer exposed Debridement: Pre-procedure Verification/Time Yes - 11:09 Out Taken: Start Time: 11:09 Pain Control: Other : lidocaine 4% Total Area Debrided (L x W): 0.3 (cm) x 0.3 (cm) = 0.09 (cm) Tissue and other material Viable, Callus, Subcutaneous debrided: Level: Skin/Subcutaneous Tissue Debridement Description: Excisional Instrument: Curette Bleeding: Minimum Hemostasis Achieved: Pressure End Time: 11:10 Procedural Pain: 0 Post Procedural Pain: 0 Response to Treatment: Procedure was tolerated well Level of Consciousness: Awake and Alert Post  Debridement Measurements of Total Wound Length: (cm) 0.3 Width: (cm) 0.3 Depth: (cm) 0.3 Volume: (cm) 0.021 Character of Wound/Ulcer Post Debridement: Stable Severity of Tissue Post Debridement: Fat layer exposed Post Procedure Diagnosis Same as Pre-procedure Electronic Signature(s) Signed: 06/07/2018 11:15:30 AM By: Lawanda Cousins Signed: 06/07/2018 2:06:43 PM By: Roger Shelter Entered By: Lawanda Cousins on 06/07/2018 11:15:29 [name], [name] ([id number]) -------------------------------------------------------------------------------- HPI Details Patient Name: Heather Dear A. Date of Service: 06/07/2018 11:00 AM Medical Record Number: [id number] Patient Account Number: [id number] Date of Birth/Sex: 02/11/1976 (42 y.o. F) Treating RN: Roger Shelter Primary Care Provider: Suzan Garibaldi Other Clinician: Referring Provider: Suzan Garibaldi Treating Provider/Extender: Cathie Olden in Treatment: 20 History of Present Illness HPI Description: 01/18/18-She is here for initial evaluation of the left great toe ulcer. She is a poor historian in regards to timeframe in detail. She states approximately 4 weeks ago she lacerated her toe on something in the house. She followed up with her primary care who placed her on Bactrim and ultimately a second dose of Bactrim prior to coming to wound clinic. She states she has been treating the toe with peroxide, Betadine and a Band-Aid. She did not check her blood sugar this morning but checked it yesterday morning it was 327; she is unaware of a recent A1c and there are no current records. She saw Dr. she would've orthopedics last week for an old injury to the left ankle, she states he did not see her toe, nor did she bring it to his attention. She smokes approximately 1 pack cigarettes a day. Her social situation is concerning, she arrives this morning with her mother who appears extremely intoxicated/under the influence; her mother was asked to  leave the room and be monitored by the patient's grandmother. The patient's aunt then accompanied the patient and the room throughout the rest of the appointment. We had a lengthy discussion regarding the deleterious effects of uncontrolled hyperglycemia and smoking as it relates to wound healing and overall health. She was strongly encouraged to decrease  her smoking and get her diabetes under better control. She states she is currently on a diet and has cut down her [facility] consumption. The left toe is erythematous, macerated and slightly edematous with malodor present. The edema in her left foot is below her baseline, there is no erythema streaking. We will treat her with Santyl, doxycycline; we have ordered and xray, culture and provided a Peg assist surgical shoe and cultured the wound. 01/25/18-She is here in follow-up evaluation for a left great toe ulcer and presents with an abscess to her suprapubic area. She states her blood sugars remain elevated, feeling "sick" and if levels are below 250, but she is trying. She has made no attempt to decrease her smoking stating that we "can't take away her food in her cigarettes". She has been compliant with offloading using the PEG assist you. She is using Santyl daily. the culture obtained last week grew staph aureus and Enterococcus faecalis; continues on the doxycycline and Augmentin was added on Monday. The suprapubic area has erythema, no femoral variation, purple discoloration, minimal induration, was accessed with a cotton tip applicator with sanguinopurulent drainage, this was cultured, I suspect the current antibiotic treatment will cover and we will not add anything to her current treatment plan. She was advised to go to urgent care or ER with any change in redness, induration or fever. 02/01/18-She is here in follow-up evaluation for left great toe ulcers and a new abdominal abscess from last week. She was able to use packing until earlier  this week, where she "forgot it was there". She states she was feeling ill with GI symptoms last week and was not taking her antibiotic. She states her glucose levels have been predominantly less than 200, with occasional levels between 200-250. She thinks this was contributing to her GI symptoms as they have resolved without intervention. There continues to be significant laceration to left toe, otherwise it clinically looks stable/improved. There is now less superficial opening to the lateral aspect of the great toe that was residual blister. We will transition to [facility] to all wounds, she will continue her Augmentin. If there is no change or deterioration next week for reculture. 02/08/18-She is here in follow-up evaluation for left great toe ulcer and abdominal ulcer. There is an improvement in both wounds. She has been wrapping her left toe with coban, not by our direction, which has created an area of discoloration to the medial aspect; she has been advised to NOT use coban secondary to her neuropathy. She states her glucose levels have been high over this last week ranging from 200-350, she continues to smoke. She admits to being less compliant with her offloading shoe. We will continue with same treatment plan and she will follow-up next week. 02/15/18-She is here in follow-up evaluation for left great toe ulcer and abdominal ulcer. The abdominal ulcer is epithelialized. The left great toe ulcer is improved and all injury from last week using the Coban wrap is resolved, the lateral ulcer is healed. She admits to noncompliance with wearing offloading shoe and admits to glucose levels being greater than 300 most of the week. She continues to smoke and expresses no desire to quit. There is one area medially that probes deeper than it has historically, erythema to the toe and dorsal foot has consistently waxed and waned. There is no overt signs of cellulitis or infection but we will culture  the wound for any occult infection given the new area of depth and  erythema. We will hold off on sensitivities for initiation of antibiotic therapy. 02/22/18-She is here in follow up evaluation for left great toe ulcer. There is overall significant improvement in both wound appearance, erythema and edema with changes made last week. She was not initiated on antibiotic therapy. Culture obtained last week showed oxacillin sensitive staph aureus, sensitive to clindamycin. Clindamycin has been called into the pharmacy but she has been instructed to hold off on initiation secondary to overall clinical improvement and her history of antibiotic intolerance. She has been instructed to contact the clinic with any noted changes/deterioration and the wound, erythema, Giebler, Sacha A. ([id number]) edema and/or pain. She will follow-up next week. She continues to smoke and her glucose levels remain elevated >250; she admits to compliance with offloading shoe 03/01/18 on evaluation today patient appears to be doing fairly well in regard to her left first toe ulcer. She has been tolerating the dressing changes with the [facility] Dressing without complication and overall this has definitely showed signs of improvement according to records as well is what the patient tells me today. I'm very pleased in that regard. She is having no pain today 03/08/18 She is here for follow up evaluation of a left great toe ulcer. She remains non-compliant with glucose control and smoking cessation; glucose levels consistently >200. She states that she got new shoe inserts/peg assist. She admits to compliance with offloading. Since my last evaluation there is significant improvement. We will switch to prisma at this time and she will follow up next week. She is noted to be tachycardic at this appointment, heart rate 120s; she has a history of heart rate 70-130 according to our records. She admits to extreme agitation r/t personal  issues; she was advised to monitor her heartrate and contact her physician if it does not return to a more normal range (<100). She takes cardizem twice daily. 03/15/18-She is here in follow-up evaluation for left great toe ulcer. She remains noncompliant with glucose control and smoking cessation. She admits to compliance with wearing offloading shoe. The ulcer is improved/stable and we will continue with the same treatment plan and she will follow-up next week 03/22/18-She is here for evaluation for left great toe ulcer. There continues to be significant improvement despite recurrent hyperglycemia (over 500 yesterday) and she continues to smoke. She has been compliant with offloading and we will continue with same treatment plan and she will follow-up next week. 03/29/18-She is here for evaluation for left great toe ulcer. Despite continuing to smoke and uncontrolled diabetes she continues to improve. She is compliant with offloading shoe. We will continue with the same treatment plan and she will follow-up next week 04/05/18- She is here in follow up evaluation for a left great toe ulcer; she presents with small pustule to left fifth toe (resembles ant bite). She admits to compliance with wearing offloading shoe; continues to smoke or have uncontrolled blood glucose control. There is more callus than usual with evidence of bleeding; she denies known trauma. 04/12/18-She is here for evaluation of left great toe ulcer. Despite noncompliance with glycemic control and smoking she continues to make improvement. She continues to wear offloading shoe. The pustule, that was identified last week, to the left fifth toe is resolved. She will follow-up in 2 weeks 05/03/18-she is seen in follow-up evaluation for a left great toe ulcer. She is compliant with offloading, otherwise noncompliant with glycemic control and smoking. She has plateaued and there is minimal improvement noted. We will  transition to [facility], replaced the insert to her surgical shoe and she will follow-up in one week 05/10/18- She is here in follow up evaluation for a left great toe ulcer. It appears stable despite measurement change. We will continue with same treatment plan and follow up next week. 05/24/18-She is seen in follow-up evaluation for a left great toe ulcer. She remains compliant with offloading, has made significant improvement in her diet, decreasing the amount of sugar/soda. She said her recent A1c was 10.9 which is lower than. She did see a diabetic nutritionist/educator yesterday. She continues to smoke. We will continue with the same treatment plan and she'll follow-up next week. 05/31/18- She is seen in follow-up evaluation for left great toe ulcer. She continues to remain compliant with offloading, continues to make improvement in her diet, increasing her water and decreasing the amount of sugar/soda. She does continue to smoke with no desire to quit. We will apply Prisma to the depth and Hydrofera Blue over. We have not received insurance authorization for oasis. She will follow up next week. 06/07/18-She is seen in follow-up evaluation for left great toe ulcer. It has stalled according to today's measurements although base appears stable. She says she saw a diabetic educator yesterday; her average blood sugars are less than 300 which is an improvement for her. She continues to smoke and states "that's my next step" She continues with water over soda. We will order for xray, culture and reinstate ace wrap compression prior to placing apligraf for next week. She is voicing no complaints or concerns. Her dressing will change to iodoflex over the next week in preparation for apligraf. Electronic Signature(s) Signed: 06/07/2018 11:28:16 AM By: Lawanda Cousins Entered By: Lawanda Cousins on 06/07/2018 11:28:16 [name]  ([id number]) -------------------------------------------------------------------------------- Physician Orders Details Patient Name: Heather Dear A. Date of Service: 06/07/2018 11:00 AM Medical Record Number: [id number] Patient Account Number: [id number] Date of Birth/Sex: 03/05/1976 (42 y.o. F) Treating RN: Roger Shelter Primary Care Provider: Suzan Garibaldi Other Clinician: Referring Provider: Suzan Garibaldi Treating Provider/Extender: Cathie Olden in Treatment: 20 Verbal / Phone Orders: No Diagnosis Coding Wound Cleansing Wound #1 Left Toe Great o Clean wound with Normal Saline. o Cleanse wound with mild soap and water o May Shower, gently pat wound dry prior to applying new dressing. Anesthetic (add to Medication List) Wound #1 Left Toe Great o Topical Lidocaine 4% cream applied to wound bed prior to debridement (In Clinic Only). Primary Wound Dressing Wound #1 Left Toe Great o Iodoflex Secondary Dressing Wound #1 Left Toe Great o Dry Gauze o Conform/Kerlix o Other - ace wrap Dressing Change Frequency Wound #1 Left Toe Great o Change dressing every other day. o Other: - as needed Follow-up Appointments Wound #1 Left Toe Great o Return Appointment in 1 week. Edema Control Wound #1 Left Toe Great o Elevate legs to the level of the heart and pump ankles as often as possible Off-Loading Wound #1 Left Toe Great o Open toe surgical shoe with peg assist. Additional Orders / Instructions Wound #1 Left Toe Great o Stop Smoking o Increase protein intake. [name], [name] ([id number]) Laboratory o Bacteria identified in Wound by Culture (MICRO) - left great toe oooo LOINC Code: 2409-7 oooo Convenience Name: Wound culture routine Radiology o X-ray, toes - left great toe Electronic Signature(s) Signed: 06/07/2018 4:32:21 PM By: Lawanda Cousins Entered By: Lawanda Cousins on 06/07/2018 11:29:01 [name], [name]  ([id number]) -------------------------------------------------------------------------------- Problem List Details Patient Name: Heather Dear A.  Date of Service: 06/07/2018 11:00 AM Medical Record Number: [id number] Patient Account Number: [id number] Date of Birth/Sex: 1976-02-10 (42 y.o. 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 ([id number]) -------------------------------------------------------------------------------- Progress Note Details Patient Name: Heather Dear A. Date of Service: 06/07/2018 11:00 AM Medical Record Number: [id number] Patient Account Number: [id number] Date of Birth/Sex: [birth year] (42 y.o. F) Treating RN: Roger Shelter Primary Care Provider: Suzan Garibaldi Other Clinician: Referring Provider: Suzan Garibaldi Treating Provider/Extender: Cathie Olden in Treatment: 20 Subjective Chief Complaint Information obtained from Patient She is here for evaluation of a left great toe ulcer History of Present Illness (HPI) 01/18/18-She is here for initial evaluation of the left great toe ulcer. She is a poor historian in regards to timeframe in detail. She states approximately  4 weeks ago she lacerated her toe on something in the house. She followed up with her primary care who placed her on Bactrim and ultimately a second dose of Bactrim prior to coming to wound clinic. She states she has been treating the toe with peroxide, Betadine and a Band-Aid. She did not check her blood sugar this morning but checked it yesterday morning it was 327; she is unaware of a recent A1c and there are no current records. She saw Dr. she would've orthopedics last week for an old injury to the left ankle, she states he did not see her toe, nor did she bring it to his attention. She smokes approximately 1 pack cigarettes a day. Her social situation is concerning, she arrives this morning with her mother who appears extremely intoxicated/under the influence; her mother was asked to leave the room and be monitored by the patient's grandmother. The patient's aunt then accompanied the patient and the room throughout the rest of the appointment. We had a lengthy discussion regarding the deleterious effects of uncontrolled hyperglycemia and smoking as it relates to wound healing and overall health. She was strongly encouraged to decrease her smoking and get her diabetes under better control. She states she is currently on a diet and has cut down her [facility] consumption. The left toe is erythematous, macerated and slightly edematous with malodor present. The edema in her left foot is below her baseline, there is no erythema streaking. We will treat her with Santyl, doxycycline; we have ordered and xray, culture and provided a Peg assist surgical shoe and cultured the wound. 01/25/18-She is here in follow-up evaluation for a left great toe ulcer and presents with an abscess to her suprapubic area. She states her blood sugars remain elevated, feeling "sick" and if levels are below 250, but she is trying. She has made no attempt to decrease her smoking stating that we "can't take away her food in  her cigarettes". She has been compliant with offloading using the PEG assist you. She is using Santyl daily. the culture obtained last week grew staph aureus  and Enterococcus faecalis; continues on the doxycycline and Augmentin was added on Monday. The suprapubic area has erythema, no femoral variation, purple discoloration, minimal induration, was accessed with a cotton tip applicator with sanguinopurulent drainage, this was cultured, I suspect the current antibiotic treatment will cover and we will not add anything to her current treatment plan. She was advised to go to urgent care or ER with any change in redness, induration or fever. 02/01/18-She is here in follow-up evaluation for left great toe ulcers and a new abdominal abscess from last week. She was able to use packing until earlier this week, where she "forgot it was there". She states she was feeling ill with GI symptoms last week and was not taking her antibiotic. She states her glucose levels have been predominantly less than 200, with occasional levels between 200-250. She thinks this was contributing to her GI symptoms as they have resolved without intervention. There continues to be significant laceration to left toe, otherwise it clinically looks stable/improved. There is now less superficial opening to the lateral aspect of the great toe that was residual blister. We will transition to [facility] to all wounds, she will continue her Augmentin. If there is no change or deterioration next week for reculture. 02/08/18-She is here in follow-up evaluation for left great toe ulcer and abdominal ulcer. There is an improvement in both wounds. She has been wrapping her left toe with coban, not by our direction, which has created an area of discoloration to the medial aspect; she has been advised to NOT use coban secondary to her neuropathy. She states her glucose levels have been high over this last week ranging from 200-350, she continues  to smoke. She admits to being less compliant with her offloading shoe. We will continue with same treatment plan and she will follow-up next week. 02/15/18-She is here in follow-up evaluation for left great toe ulcer and abdominal ulcer. The abdominal ulcer is epithelialized. The left great toe ulcer is improved and all injury from last week using the Coban wrap is resolved, the lateral ulcer is healed. She admits to noncompliance with wearing offloading shoe and admits to glucose levels being greater than 300 most of the week. She continues to smoke and expresses no desire to quit. There is one area medially that probes deeper than it has historically, erythema to the toe and dorsal foot has consistently waxed and waned. There is no overt signs of cellulitis or infection but we will culture the wound for any occult infection given the new area of depth and erythema. We will hold off on Wolfrey, Dallana A. ([id number]) sensitivities for initiation of antibiotic therapy. 02/22/18-She is here in follow up evaluation for left great toe ulcer. There is overall significant improvement in both wound appearance, erythema and edema with changes made last week. She was not initiated on antibiotic therapy. Culture obtained last week showed oxacillin sensitive staph aureus, sensitive to clindamycin. Clindamycin has been called into the pharmacy but she has been instructed to hold off on initiation secondary to overall clinical improvement and her history of antibiotic intolerance. She has been instructed to contact the clinic with any noted changes/deterioration and the wound, erythema, edema and/or pain. She will follow-up next week. She continues to smoke and her glucose levels remain elevated >250; she admits to compliance with offloading shoe 03/01/18 on evaluation today patient appears to be doing fairly well in regard to her left first toe ulcer. She has been tolerating the dressing changes with  the [facility] Dressing without complication and overall this has definitely showed signs of improvement according to records as well is what the patient tells me today. I'm very pleased in that regard. She is having no pain today 03/08/18 She is here for follow up evaluation of a left great toe ulcer. She remains non-compliant with glucose control and smoking cessation; glucose levels consistently >200. She states that she got new shoe inserts/peg assist. She admits to compliance with offloading. Since my last evaluation there is significant improvement. We will switch to prisma at this time and she will follow up next week. She is noted to be tachycardic at this appointment, heart rate 120s; she has a history of heart rate 70-130 according to our records. She admits to extreme agitation r/t personal issues; she was advised to monitor her heartrate and contact her physician if it does not return to a more normal range (<100). She takes cardizem twice daily. 03/15/18-She is here in follow-up evaluation for left great toe ulcer. She remains noncompliant with glucose control and smoking cessation. She admits to compliance with wearing offloading shoe. The ulcer is improved/stable and we will continue with the same treatment plan and she will follow-up next week 03/22/18-She is here for evaluation for left great toe ulcer. There continues to be significant improvement despite recurrent hyperglycemia (over 500 yesterday) and she continues to smoke. She has been compliant with offloading and we will continue with same treatment plan and she will follow-up next week. 03/29/18-She is here for evaluation for left great toe ulcer. Despite continuing to smoke and uncontrolled diabetes she continues to improve. She is compliant with offloading shoe. We will continue with the same treatment plan and she will follow-up next week 04/05/18- She is here in follow up evaluation for a left great toe ulcer; she presents with small  pustule to left fifth toe (resembles ant bite). She admits to compliance with wearing offloading shoe; continues to smoke or have uncontrolled blood glucose control. There is more callus than usual with evidence of bleeding; she denies known trauma. 04/12/18-She is here for evaluation of left great toe ulcer. Despite noncompliance with glycemic control and smoking she continues to make improvement. She continues to wear offloading shoe. The pustule, that was identified last week, to the left fifth toe is resolved. She will follow-up in 2 weeks 05/03/18-she is seen in follow-up evaluation for a left great toe ulcer. She is compliant with offloading, otherwise noncompliant with glycemic control and smoking. She has plateaued and there is minimal improvement noted. We will transition to [facility], replaced the insert to her surgical shoe and she will follow-up in one week 05/10/18- She is here in follow up evaluation for a left great toe ulcer. It appears stable despite measurement change. We will continue with same treatment plan and follow up next week. 05/24/18-She is seen in follow-up evaluation for a left great toe ulcer. She remains compliant with offloading, has made significant improvement in her diet, decreasing the amount of sugar/soda. She said her recent A1c was 10.9 which is lower than. She did see a diabetic nutritionist/educator yesterday. She continues to smoke. We will continue with the same treatment plan and she'll follow-up next week. 05/31/18- She is seen in follow-up evaluation for left great toe ulcer. She continues to remain compliant with offloading, continues to make improvement in her diet, increasing her water and decreasing the amount of sugar/soda. She does continue to smoke with no desire to quit. We  will apply Prisma to the depth and Hydrofera Blue over. We have not received insurance authorization for oasis. She will follow up next week. 06/07/18-She is seen in follow-up  evaluation for left great toe ulcer. It has stalled according to today's measurements although base appears stable. She says she saw a diabetic educator yesterday; her average blood sugars are less than 300 which is an improvement for her. She continues to smoke and states "that's my next step" She continues with water over soda. We will order for xray, culture and reinstate ace wrap compression prior to placing apligraf for next week. She is voicing no complaints or concerns. Her dressing will change to iodoflex over the next week in preparation for apligraf. [name], [name] ([id number]) Objective Constitutional Vitals Time Taken: 10:55 AM, Height: 69 in, Weight: 250 lbs, BMI: 36.9, Temperature: 98.2 F, Pulse: 94 bpm, Respiratory Rate: 16 breaths/min, Blood Pressure: 92/48 mmHg. Integumentary (Hair, Skin) Wound #1 status is Open. Original cause of wound was Gradually Appeared. The wound is located on the Left Toe Great. The wound measures 0.3cm length x 0.3cm width x 0.3cm depth; 0.071cm^2 area and 0.021cm^3 volume. There is Fat Layer (Subcutaneous Tissue) Exposed exposed. There is no tunneling or undermining noted. There is a small amount of serosanguineous drainage noted. The wound margin is flat and intact. There is large (67-100%) red granulation within the wound bed. There is a small (1-33%) amount of necrotic tissue within the wound bed including Adherent Slough. The periwound skin appearance exhibited: Callus, Maceration, Erythema. The periwound skin appearance did not exhibit: Crepitus, Excoriation, Induration, Rash, Scarring, Dry/Scaly, Atrophie Blanche, Cyanosis, Ecchymosis, Hemosiderin Staining, Mottled, Pallor, Rubor. The surrounding wound skin color is noted with erythema which is circumferential. Periwound temperature was noted as No Abnormality. Assessment Active Problems ICD-10 Type 2 diabetes mellitus with foot ulcer Non-pressure chronic ulcer of other part of left foot  limited to breakdown of skin Procedures Wound #1 Pre-procedure diagnosis of Wound #1 is a Diabetic Wound/Ulcer of the Lower Extremity located on the Left Toe Great .Severity of Tissue Pre Debridement is: Fat layer exposed. There was a Excisional Skin/Subcutaneous Tissue Debridement with a total area of 0.09 sq cm performed by Lawanda Cousins, NP. With the following instrument(s): Curette to remove Viable tissue/material. Material removed includes Callus and Subcutaneous Tissue and after achieving pain control using Other (lidocaine 4%). No specimens were taken. A time out was conducted at 11:09, prior to the start of the procedure. A Minimum amount of bleeding was controlled with Pressure. The procedure was tolerated well with a pain level of 0 throughout and a pain level of 0 following the procedure. Patient s Level of Consciousness post procedure was recorded as Awake and Alert. Post Debridement Measurements: 0.3cm length x 0.3cm width x 0.3cm depth; 0.021cm^3 volume. Character of Wound/Ulcer Post Debridement is stable. Severity of Tissue Post Debridement is: Fat layer exposed. Post procedure Diagnosis Wound #1: Same as Pre-Procedure Plan Wound Cleansing: [name], [name] ([id number]) Wound #1 Left Toe Great: Clean wound with Normal Saline. Cleanse wound with mild soap and water May Shower, gently pat wound dry prior to applying new dressing. Anesthetic (add to Medication List): Wound #1 Left Toe Great: Topical Lidocaine 4% cream applied to wound bed prior to debridement (In Clinic Only). Primary Wound Dressing: Wound #1 Left Toe Great: Iodoflex Secondary Dressing: Wound #1 Left Toe Great: Dry Gauze Conform/Kerlix Other - ace wrap Dressing Change Frequency: Wound #1 Left Toe Great: Change dressing every other day. Other: -  as needed Follow-up Appointments: Wound #1 Left Toe Great: Return Appointment in 1 week. Edema Control: Wound #1 Left Toe Great: Elevate legs to the level  of the heart and pump ankles as often as possible Off-Loading: Wound #1 Left Toe Great: Open toe surgical shoe with peg assist. Additional Orders / Instructions: Wound #1 Left Toe Great: Stop Smoking Increase protein intake. Laboratory ordered were: Wound culture routine - left great toe Radiology ordered were: X-ray, toes - left great toe Electronic Signature(s) Signed: 06/07/2018 11:29:11 AM By: Lawanda Cousins Entered By: Lawanda Cousins on 06/07/2018 11:29:11 [name], [name] ([id number]) -------------------------------------------------------------------------------- [place] Details Patient Name: Heather Dear A. Date of Service: 06/07/2018 Medical Record Number: [id number] Patient Account Number: [id number] Date of Birth/Sex: Jan 17, 1976 (42 y.o. F) Treating RN: Roger Shelter Primary Care Provider: Suzan Garibaldi Other Clinician: Referring Provider: Suzan Garibaldi Treating Provider/Extender: Cathie Olden in Treatment: 20 Diagnosis Coding ICD-10 Codes Code Description E11.621 Type 2 diabetes mellitus with foot ulcer L97.521 Non-pressure chronic ulcer of other part of left foot limited to breakdown of skin Facility Procedures CPT4 Code Description: 36144315 11042 - DEB SUBQ TISSUE 20 SQ CM/< ICD-10 Diagnosis Description L97.521 Non-pressure chronic ulcer of other part of left foot limited t Modifier: o breakdown of s Quantity: 1 kin Physician Procedures CPT4 Code Description: 4008676 19509 - WC PHYS LEVEL 3 - EST PT ICD-10 Diagnosis Description E11.621 Type 2 diabetes mellitus with foot ulcer L97.521 Non-pressure chronic ulcer of other part of left foot limited t Modifier: o breakdown of sk Quantity: 1 in CPT4 Code Description: 3267124 11042 - WC PHYS SUBQ TISS 20 SQ CM ICD-10 Diagnosis Description L97.521 Non-pressure chronic ulcer of other part of left foot limited t Modifier: o breakdown of sk Quantity: 1 in Electronic Signature(s) Signed: 06/07/2018 11:29:29 AM  By: Lawanda Cousins Entered By: Lawanda Cousins on 06/07/2018 11:29:29

## 2018-06-21 NOTE — Progress Notes (Signed)
SAFIYA, GIRDLER ([id number]) Visit Report for 06/07/2018 Arrival Information Details Patient Name: [name], [name] Date of Service: 06/07/2018 11:00 AM Medical Record Number: [id number] Patient Account Number: [id number] Date of Birth/Sex: 06-08-1976 (42 y.o. F) Treating RN: Montey Hora Primary Care Tim Corriher: Suzan Garibaldi Other Clinician: Referring Berry Gallacher: Suzan Garibaldi Treating Keneshia Tena/Extender: Cathie Olden in Treatment: 20 Visit Information History Since Last Visit Added or deleted any medications: No Patient Arrived: Wheel Chair Any new allergies or adverse reactions: No Arrival Time: 10:53 Had a fall or experienced change in No activities of daily living that may affect Accompanied By: father risk of falls: Transfer Assistance: None Signs or symptoms of abuse/neglect since last visito No Patient Identification Verified: Yes Hospitalized since last visit: No Secondary Verification Process Completed: Yes Implantable device outside of the clinic excluding No Patient Requires Transmission-Based No cellular tissue based products placed in the center Precautions: since last visit: Patient Has Alerts: Yes Has Dressing in Place as Prescribed: Yes Patient Alerts: DMII Pain Present Now: No Electronic Signature(s) Signed: 06/07/2018 4:33:06 PM By: Montey Hora Entered By: Montey Hora on 06/07/2018 10:55:01 [name] ([id number]) -------------------------------------------------------------------------------- Encounter Discharge Information Details Patient Name: Heather Dear A. Date of Service: 06/07/2018 11:00 AM Medical Record Number: [id number] Patient Account Number: [id number] Date of Birth/Sex: 05/08/1976 (42 y.o. F) Treating RN: Montey Hora Primary Care Ramie Erman: Suzan Garibaldi Other Clinician: Referring Laniya Friedl: Suzan Garibaldi Treating Joee Iovine/Extender: Cathie Olden in Treatment: 20 Encounter Discharge Information Items Discharge  Condition: Stable Ambulatory Status: Wheelchair Discharge Destination: Home Transportation: Private Auto Accompanied By: father Schedule Follow-up Appointment: Yes Clinical Summary of Care: Electronic Signature(s) Signed: 06/07/2018 11:46:52 AM By: Montey Hora Entered By: Montey Hora on 06/07/2018 11:46:52 Schmale, Venda A. ([id number]) -------------------------------------------------------------------------------- Lower Extremity Assessment Details Patient Name: Heather Dear A. Date of Service: 06/07/2018 11:00 AM Medical Record Number: [id number] Patient Account Number: [id number] Date of Birth/Sex: [birth year] (42 y.o. F) Treating RN: Montey Hora Primary Care Amala Petion: Suzan Garibaldi Other Clinician: Referring Kyce Ging: Suzan Garibaldi Treating Nilza Eaker/Extender: Cathie Olden in Treatment: 20 Vascular Assessment Pulses: Dorsalis Pedis Palpable: [Left:Yes] Posterior Tibial Extremity colors, hair growth, and conditions: Extremity Color: [Left:Normal] Hair Growth on Extremity: [Left:Yes] Temperature of Extremity: [Left:Warm] Capillary Refill: [Left:< 3 seconds] Toe Nail Assessment Left: Right: Thick: Yes Discolored: Yes Deformed: No Improper Length and Hygiene: No Electronic Signature(s) Signed: 06/07/2018 4:33:06 PM By: Montey Hora Entered By: Montey Hora on 06/07/2018 11:02:45 Gotts, Ilia A. ([id number]) -------------------------------------------------------------------------------- Multi Wound Chart Details Patient Name: Heather Dear A. Date of Service: 06/07/2018 11:00 AM Medical Record Number: [id number] Patient Account Number: [id number] Date of Birth/Sex: March 27, 1976 (41 y.o. F) Treating RN: Roger Shelter Primary Care Aviella Disbrow: Suzan Garibaldi Other Clinician: Referring Dolorez Jeffrey: Suzan Garibaldi Treating Astin Sayre/Extender: Cathie Olden in Treatment: 20 Vital Signs Height(in): 66 Pulse(bpm): 94 Weight(lbs): 250 Blood Pressure(mmHg):  92/48 Body Mass Index(BMI): 37 Temperature(F): 98.2 Respiratory Rate 16 (breaths/min): Photos: [1:No Photos] [N/A:N/A] Wound Location: [1:Left Toe Great] [N/A:N/A] Wounding Event: [1:Gradually Appeared] [N/A:N/A] Primary Etiology: [1:Diabetic Wound/Ulcer of the N/A Lower Extremity] Comorbid History: [1:Chronic Obstructive Pulmonary Disease (COPD), Congestive Heart Failure, Hypertension, Type II Diabetes, Neuropathy, Seizure Disorder] [N/A:N/A] Date Acquired: [1:01/01/2018] [N/A:N/A] Weeks of Treatment: [1:20] [N/A:N/A] Wound Status: [1:Open] [N/A:N/A] Pending Amputation on [1:Yes] [N/A:N/A] Presentation: Measurements L x W x D [1:0.3x0.3x0.3] [N/A:N/A] (cm) Area (cm) : [1:0.071] [N/A:N/A] Volume (cm) : [1:0.021] [N/A:N/A] % Reduction in Area: [1:99.10%] [N/A:N/A] % Reduction in Volume: [1:99.30%] [N/A:N/A] Classification: [1:Grade 2] [N/A:N/A] Exudate Amount: [1:Small] [N/A:N/A] Exudate Type: [1:Serosanguineous] [N/A:N/A] Exudate  Color: [1:red, brown] [N/A:N/A] Wound Margin: [1:Flat and Intact] [N/A:N/A] Granulation Amount: [1:Large (67-100%)] [N/A:N/A] Granulation Quality: [1:Red] [N/A:N/A] Necrotic Amount: [1:Small (1-33%)] [N/A:N/A] Exposed Structures: [1:Fat Layer (Subcutaneous Tissue) Exposed: Yes Fascia: No Tendon: No Muscle: No Joint: No Bone: No] [N/A:N/A] Epithelialization: None N/A N/A Debridement: Debridement - Excisional N/A N/A Pre-procedure 11:09 N/A N/A Verification/Time Out Taken: Pain Control: Other N/A N/A Tissue Debrided: Subcutaneous, Slough N/A N/A Level: Skin/Subcutaneous Tissue N/A N/A Debridement Area (sq cm): 0.09 N/A N/A Instrument: Curette N/A N/A Bleeding: Minimum N/A N/A Hemostasis Achieved: Pressure N/A N/A Procedural Pain: 0 N/A N/A Post Procedural Pain: 0 N/A N/A Debridement Treatment Procedure was tolerated well N/A N/A Response: Post Debridement 0.3x0.3x0.3 N/A N/A Measurements L x W x D (cm) Post Debridement Volume: 0.021 N/A  N/A (cm) Periwound Skin Texture: Callus: Yes N/A N/A Excoriation: No Induration: No Crepitus: No Rash: No Scarring: No Periwound Skin Moisture: Maceration: Yes N/A N/A Dry/Scaly: No Periwound Skin Color: Erythema: Yes N/A N/A Atrophie Blanche: No Cyanosis: No Ecchymosis: No Hemosiderin Staining: No Mottled: No Pallor: No Rubor: No Erythema Location: Circumferential N/A N/A Temperature: No Abnormality N/A N/A Tenderness on Palpation: No N/A N/A Wound Preparation: Ulcer Cleansing: N/A N/A Rinsed/Irrigated with Saline Topical Anesthetic Applied: Other: lidocaine 4% Procedures Performed: Debridement N/A N/A Treatment Notes Electronic Signature(s) Signed: 06/07/2018 11:15:11 AM By: Lawanda Cousins Entered By: Lawanda Cousins on 06/07/2018 11:15:11 [name] ([id number]) -------------------------------------------------------------------------------- [place] Details Patient Name: Heather Dear A. Date of Service: 06/07/2018 11:00 AM Medical Record Number: [id number] Patient Account Number: [id number] Date of Birth/Sex: [birth year] (42 y.o. F) Treating RN: Roger Shelter Primary Care Tauriel Scronce: Suzan Garibaldi Other Clinician: Referring Marna Weniger: Suzan Garibaldi Treating Errika Narvaiz/Extender: Cathie Olden in Treatment: 20 Active Inactive ` Abuse / Safety / Falls / Self Care Management Nursing Diagnoses: Potential for falls Goals: Patient will not experience any injury related to falls Date Initiated: 01/18/2018 Target Resolution Date: 04/28/2018 Goal Status: Active Interventions: Assess fall risk on admission and as needed Assess: immobility, friction, shearing, incontinence upon admission and as needed Assess impairment of mobility on admission and as needed per policy Assess personal safety and home safety (as indicated) on admission and as needed Assess self care needs on admission and as needed Notes: ` Nutrition Nursing  Diagnoses: Imbalanced nutrition Impaired glucose control: actual or potential Potential for alteratiion in Nutrition/Potential for imbalanced nutrition Goals: Patient/caregiver agrees to and verbalizes understanding of need to use nutritional supplements and/or vitamins as prescribed Date Initiated: 01/18/2018 Target Resolution Date: 04/28/2018 Goal Status: Active Patient/caregiver will maintain therapeutic glucose control Date Initiated: 01/18/2018 Target Resolution Date: 04/28/2018 Goal Status: Active Interventions: Assess patient nutrition upon admission and as needed per policy Provide education on elevated blood sugars and impact on wound healing Notes: MAGALIE, ALMON ([id number]) Orientation to the Wound Care Program Nursing Diagnoses: Knowledge deficit related to the wound healing center program Goals: Patient/caregiver will verbalize understanding of the Center Date Initiated: 01/18/2018 Target Resolution Date: 01/27/2018 Goal Status: Active Interventions: Provide education on orientation to the wound center Notes: ` Wound/Skin Impairment Nursing Diagnoses: Impaired tissue integrity Knowledge deficit related to smoking impact on wound healing Knowledge deficit related to ulceration/compromised skin integrity Goals: Ulcer/skin breakdown will have a volume reduction of 80% by week 12 Date Initiated: 01/18/2018 Target Resolution Date: 04/21/2018 Goal Status: Active Interventions: Assess patient/caregiver ability to perform ulcer/skin care regimen upon admission and as needed Assess ulceration(s) every visit Notes: Electronic Signature(s) Signed: 06/07/2018 2:06:43 PM By:  Flinchum, Cheryl Entered By: Roger Shelter on 06/07/2018 11:07:14 [name] ([id number]) -------------------------------------------------------------------------------- Pain Assessment Details Patient Name: KATHRIN, FOLDEN A. Date of Service: 06/07/2018 11:00 AM Medical  Record Number: [id number] Patient Account Number: [id number] Date of Birth/Sex: 03-05-1976 (42 y.o. F) Treating RN: Montey Hora Primary Care Shannan Slinker: Suzan Garibaldi Other Clinician: Referring Milia Warth: Suzan Garibaldi Treating Emberlyn Burlison/Extender: Cathie Olden in Treatment: 20 Active Problems Location of Pain Severity and Description of Pain Patient Has Paino No Site Locations Pain Management and Medication Current Pain Management: Electronic Signature(s) Signed: 06/07/2018 4:33:06 PM By: Montey Hora Entered By: Montey Hora on 06/07/2018 10:55:21 [name] ([id number]) -------------------------------------------------------------------------------- Patient/Caregiver Education Details Patient Name: Heather Dear A. Date of Service: 06/07/2018 11:00 AM Medical Record Number: [id number] Patient Account Number: [id number] Date of Birth/Gender: [birth year] (42 y.o. F) Treating RN: Montey Hora Primary Care Physician: Suzan Garibaldi Other Clinician: Referring Physician: Suzan Garibaldi Treating Physician/Extender: Cathie Olden in Treatment: 20 Education Assessment Education Provided To: Patient and Caregiver Education Topics Provided Wound/Skin Impairment: Handouts: Other: wound care as ordered Methods: Demonstration, Explain/Verbal Responses: State content correctly Electronic Signature(s) Signed: 06/07/2018 4:33:06 PM By: Montey Hora Entered By: Montey Hora on 06/07/2018 11:47:10 Scherer, Doreene A. ([id number]) -------------------------------------------------------------------------------- Wound Assessment Details Patient Name: Heather Dear A. Date of Service: 06/07/2018 11:00 AM Medical Record Number: [id number] Patient Account Number: [id number] Date of Birth/Sex: 10/06/1976 (42 y.o. F) Treating RN: Montey Hora Primary Care Vartan Kerins: Suzan Garibaldi Other Clinician: Referring Vanshika Jastrzebski: Suzan Garibaldi Treating Merial Moritz/Extender: Cathie Olden in Treatment: 20 Wound Status Wound Number: 1 Primary Diabetic Wound/Ulcer of the Lower Extremity Etiology: Wound Location: Left Toe Great Wound Open Wounding Event: Gradually Appeared Status: Date Acquired: 01/01/2018 Comorbid Chronic Obstructive Pulmonary Disease Weeks Of Treatment: 20 History: (COPD), Congestive Heart Failure, Clustered Wound: No Hypertension, Type II Diabetes, Neuropathy, Pending Amputation On Presentation Seizure Disorder Photos Photo Uploaded By: Montey Hora on 06/07/2018 12:00:05 Wound Measurements Length: (cm) 0.3 Width: (cm) 0.3 Depth: (cm) 0.3 Area: (cm) 0.071 Volume: (cm) 0.021 % Reduction in Area: 99.1% % Reduction in Volume: 99.3% Epithelialization: None Tunneling: No Undermining: No Wound Description Classification: Grade 2 Wound Margin: Flat and Intact Exudate Amount: Small Exudate Type: Serosanguineous Exudate Color: red, brown Foul Odor After Cleansing: No Slough/Fibrino Yes Wound Bed Granulation Amount: Large (67-100%) Exposed Structure Granulation Quality: Red Fascia Exposed: No Necrotic Amount: Small (1-33%) Fat Layer (Subcutaneous Tissue) Exposed: Yes Necrotic Quality: Adherent Slough Tendon Exposed: No Muscle Exposed: No Joint Exposed: No Bone Exposed: No Burby, [place] A. ([id number]) Periwound Skin Texture Texture Color No Abnormalities Noted: No No Abnormalities Noted: No Callus: Yes Atrophie Blanche: No Crepitus: No Cyanosis: No Excoriation: No Ecchymosis: No Induration: No Erythema: Yes Rash: No Erythema Location: Circumferential Scarring: No Hemosiderin Staining: No Mottled: No Moisture Pallor: No No Abnormalities Noted: No Rubor: No Dry / Scaly: No Maceration: Yes Temperature / Pain Temperature: No Abnormality Wound Preparation Ulcer Cleansing: Rinsed/Irrigated with Saline Topical Anesthetic Applied: Other: lidocaine 4%, Treatment Notes Wound #1 (Left Toe Great) 1. Cleansed  with: Clean wound with Normal Saline 2. Anesthetic Topical Lidocaine 4% cream to wound bed prior to debridement 4. Dressing Applied: Iodoflex 5. Secondary Dressing Applied Dry Gauze Kerlix/Conform 7. Secured with Recruitment consultant) Signed: 06/07/2018 4:33:06 PM By: Montey Hora Entered By: Montey Hora on 06/07/2018 11:02:02 [name] ([id number]) -------------------------------------------------------------------------------- Vitals Details Patient Name: Heather Dear A. Date of Service: 06/07/2018 11:00 AM Medical Record Number: [id number] Patient Account Number: [id number] Date of Birth/Sex: 10/14/1976 (42 y.o. F)  Treating RN: Montey Hora Primary Care Symiah Nowotny: Suzan Garibaldi Other Clinician: Referring Clark Cuff: Suzan Garibaldi Treating Walaa Carel/Extender: Cathie Olden in Treatment: 20 Vital Signs Time Taken: 10:55 Temperature (F): 98.2 Height (in): 69 Pulse (bpm): 94 Weight (lbs): 250 Respiratory Rate (breaths/min): 16 Body Mass Index (BMI): 36.9 Blood Pressure (mmHg): 92/48 Reference Range: 80 - 120 mg / dl Electronic Signature(s) Signed: 06/07/2018 4:33:06 PM By: Montey Hora Entered By: Montey Hora on 06/07/2018 10:58:03

## 2018-06-23 NOTE — Progress Notes (Addendum)
HOPIE, PELLEGRIN ([id number]) Visit Report for 06/14/2018 Chief Complaint Document Details Patient Name: [name], WROBLESKI A. Date of Service: 06/14/2018 1:30 PM Medical Record Number: [id number] Patient Account Number: [id number] Date of Birth/Sex: 14-Apr-1976 (42 y.o. F) Treating RN: Ahmed Prima Primary Care Provider: Priscille Kluver Other Clinician: Referring Provider: Priscille Kluver Treating Provider/Extender: Cathie Olden in Treatment: 21 Information Obtained from: Patient Chief Complaint She is here for evaluation of a left great toe ulcer Electronic Signature(s) Signed: 06/14/2018 1:21:17 PM By: Lawanda Cousins Entered By: Lawanda Cousins on 06/14/2018 13:21:17 [name] ([id number]) -------------------------------------------------------------------------------- Debridement Details Patient Name: Heather Dear A. Date of Service: 06/14/2018 1:30 PM Medical Record Number: [id number] Patient Account Number: [id number] Date of Birth/Sex: [birth year] (42 y.o. F) Treating RN: Ahmed Prima Primary Care Provider: Priscille Kluver Other Clinician: Referring Provider: Priscille Kluver Treating Provider/Extender: Cathie Olden in Treatment: 21 Debridement Performed for Wound #1 Left Toe Great Assessment: Performed By: Physician Lawanda Cousins, NP Debridement Type: Debridement Severity of Tissue Pre Fat layer exposed Debridement: Pre-procedure Verification/Time Yes - 13:47 Out Taken: Start Time: 13:47 Pain Control: Other : lidocaine 4% Total Area Debrided (L x W): 0.4 (cm) x 0.3 (cm) = 0.12 (cm) Tissue and other material Viable, Non-Viable, Callus, Slough, Subcutaneous, Fibrin/Exudate, Slough debrided: Level: Skin/Subcutaneous Tissue Debridement Description: Excisional Instrument: Curette Bleeding: Minimum Hemostasis Achieved: Pressure End Time: 13:49 Procedural Pain: 0 Post Procedural Pain: 0 Response to Treatment: Procedure was tolerated well Level of  Consciousness: Awake and Alert Post Debridement Measurements of Total Wound Length: (cm) 0.4 Width: (cm) 0.3 Depth: (cm) 0.4 Volume: (cm) 0.038 Character of Wound/Ulcer Post Debridement: Stable Severity of Tissue Post Debridement: Fat layer exposed Post Procedure Diagnosis Same as Pre-procedure Electronic Signature(s) Signed: 06/15/2018 3:54:45 PM By: Alric Quan Signed: 06/26/2018 5:47:54 PM By: Lawanda Cousins Entered By: Alric Quan on 06/14/2018 13:49:25 Garmany, Sanyia A. ([id number]) -------------------------------------------------------------------------------- HPI Details Patient Name: Heather Dear A. Date of Service: 06/14/2018 1:30 PM Medical Record Number: [id number] Patient Account Number: [id number] Date of Birth/Sex: Dec 08, 1975 (42 y.o. F) Treating RN: Ahmed Prima Primary Care Provider: Priscille Kluver Other Clinician: Referring Provider: Priscille Kluver Treating Provider/Extender: Cathie Olden in Treatment: 21 History of Present Illness HPI Description: 01/18/18-She is here for initial evaluation of the left great toe ulcer. She is a poor historian in regards to timeframe in detail. She states approximately 4 weeks ago she lacerated her toe on something in the house. She followed up with her primary care who placed her on Bactrim and ultimately a second dose of Bactrim prior to coming to wound clinic. She states she has been treating the toe with peroxide, Betadine and a Band-Aid. She did not check her blood sugar this morning but checked it yesterday morning it was 327; she is unaware of a recent A1c and there are no current records. She saw Dr. she would've orthopedics last week for an old injury to the left ankle, she states he did not see her toe, nor did she bring it to his attention. She smokes approximately 1 pack cigarettes a day. Her social situation is concerning, she arrives this morning with her mother who appears extremely intoxicated/under the  influence; her mother was asked to leave the room and be monitored by the patient's grandmother. The patient's aunt then accompanied the patient and the room throughout the rest of the appointment. We had a lengthy discussion regarding the deleterious effects of uncontrolled hyperglycemia and smoking as it relates to wound healing and overall health. She was  strongly encouraged to decrease her smoking and get her diabetes under better control. She states she is currently on a diet and has cut down her [facility] consumption. The left toe is erythematous, macerated and slightly edematous with malodor present. The edema in her left foot is below her baseline, there is no erythema streaking. We will treat her with Santyl, doxycycline; we have ordered and xray, culture and provided a Peg assist surgical shoe and cultured the wound. 01/25/18-She is here in follow-up evaluation for a left great toe ulcer and presents with an abscess to her suprapubic area. She states her blood sugars remain elevated, feeling "sick" and if levels are below 250, but she is trying. She has made no attempt to decrease her smoking stating that we "can't take away her food in her cigarettes". She has been compliant with offloading using the PEG assist you. She is using Santyl daily. the culture obtained last week grew staph aureus and Enterococcus faecalis; continues on the doxycycline and Augmentin was added on Monday. The suprapubic area has erythema, no femoral variation, purple discoloration, minimal induration, was accessed with a cotton tip applicator with sanguinopurulent drainage, this was cultured, I suspect the current antibiotic treatment will cover and we will not add anything to her current treatment plan. She was advised to go to urgent care or ER with any change in redness, induration or fever. 02/01/18-She is here in follow-up evaluation for left great toe ulcers and a new abdominal abscess from last week. She  was able to use packing until earlier this week, where she "forgot it was there". She states she was feeling ill with GI symptoms last week and was not taking her antibiotic. She states her glucose levels have been predominantly less than 200, with occasional levels between 200-250. She thinks this was contributing to her GI symptoms as they have resolved without intervention. There continues to be significant laceration to left toe, otherwise it clinically looks stable/improved. There is now less superficial opening to the lateral aspect of the great toe that was residual blister. We will transition to [facility] to all wounds, she will continue her Augmentin. If there is no change or deterioration next week for reculture. 02/08/18-She is here in follow-up evaluation for left great toe ulcer and abdominal ulcer. There is an improvement in both wounds. She has been wrapping her left toe with coban, not by our direction, which has created an area of discoloration to the medial aspect; she has been advised to NOT use coban secondary to her neuropathy. She states her glucose levels have been high over this last week ranging from 200-350, she continues to smoke. She admits to being less compliant with her offloading shoe. We will continue with same treatment plan and she will follow-up next week. 02/15/18-She is here in follow-up evaluation for left great toe ulcer and abdominal ulcer. The abdominal ulcer is epithelialized. The left great toe ulcer is improved and all injury from last week using the Coban wrap is resolved, the lateral ulcer is healed. She admits to noncompliance with wearing offloading shoe and admits to glucose levels being greater than 300 most of the week. She continues to smoke and expresses no desire to quit. There is one area medially that probes deeper than it has historically, erythema to the toe and dorsal foot has consistently waxed and waned. There is no overt signs of  cellulitis or infection but we will culture the wound for any occult infection given the new  area of depth and erythema. We will hold off on sensitivities for initiation of antibiotic therapy. 02/22/18-She is here in follow up evaluation for left great toe ulcer. There is overall significant improvement in both wound appearance, erythema and edema with changes made last week. She was not initiated on antibiotic therapy. Culture obtained last week showed oxacillin sensitive staph aureus, sensitive to clindamycin. Clindamycin has been called into the pharmacy but she has been instructed to hold off on initiation secondary to overall clinical improvement and her history of antibiotic intolerance. She has been instructed to contact the clinic with any noted changes/deterioration and the wound, erythema, Gettel, Yukari A. ([id number]) edema and/or pain. She will follow-up next week. She continues to smoke and her glucose levels remain elevated >250; she admits to compliance with offloading shoe 03/01/18 on evaluation today patient appears to be doing fairly well in regard to her left first toe ulcer. She has been tolerating the dressing changes with the [facility] Dressing without complication and overall this has definitely showed signs of improvement according to records as well is what the patient tells me today. I'm very pleased in that regard. She is having no pain today 03/08/18 She is here for follow up evaluation of a left great toe ulcer. She remains non-compliant with glucose control and smoking cessation; glucose levels consistently >200. She states that she got new shoe inserts/peg assist. She admits to compliance with offloading. Since my last evaluation there is significant improvement. We will switch to prisma at this time and she will follow up next week. She is noted to be tachycardic at this appointment, heart rate 120s; she has a history of heart rate 70-130 according to our records.  She admits to extreme agitation r/t personal issues; she was advised to monitor her heartrate and contact her physician if it does not return to a more normal range (<100). She takes cardizem twice daily. 03/15/18-She is here in follow-up evaluation for left great toe ulcer. She remains noncompliant with glucose control and smoking cessation. She admits to compliance with wearing offloading shoe. The ulcer is improved/stable and we will continue with the same treatment plan and she will follow-up next week 03/22/18-She is here for evaluation for left great toe ulcer. There continues to be significant improvement despite recurrent hyperglycemia (over 500 yesterday) and she continues to smoke. She has been compliant with offloading and we will continue with same treatment plan and she will follow-up next week. 03/29/18-She is here for evaluation for left great toe ulcer. Despite continuing to smoke and uncontrolled diabetes she continues to improve. She is compliant with offloading shoe. We will continue with the same treatment plan and she will follow-up next week 04/05/18- She is here in follow up evaluation for a left great toe ulcer; she presents with small pustule to left fifth toe (resembles ant bite). She admits to compliance with wearing offloading shoe; continues to smoke or have uncontrolled blood glucose control. There is more callus than usual with evidence of bleeding; she denies known trauma. 04/12/18-She is here for evaluation of left great toe ulcer. Despite noncompliance with glycemic control and smoking she continues to make improvement. She continues to wear offloading shoe. The pustule, that was identified last week, to the left fifth toe is resolved. She will follow-up in 2 weeks 05/03/18-she is seen in follow-up evaluation for a left great toe ulcer. She is compliant with offloading, otherwise noncompliant with glycemic control and smoking. She has plateaued and there is minimal  improvement noted. We will transition to [facility], replaced the insert to her surgical shoe and she will follow-up in one week 05/10/18- She is here in follow up evaluation for a left great toe ulcer. It appears stable despite measurement change. We will continue with same treatment plan and follow up next week. 05/24/18-She is seen in follow-up evaluation for a left great toe ulcer. She remains compliant with offloading, has made significant improvement in her diet, decreasing the amount of sugar/soda. She said her recent A1c was 10.9 which is lower than. She did see a diabetic nutritionist/educator yesterday. She continues to smoke. We will continue with the same treatment plan and she'll follow-up next week. 05/31/18- She is seen in follow-up evaluation for left great toe ulcer. She continues to remain compliant with offloading, continues to make improvement in her diet, increasing her water and decreasing the amount of sugar/soda. She does continue to smoke with no desire to quit. We will apply Prisma to the depth and Hydrofera Blue over. We have not received insurance authorization for oasis. She will follow up next week. 06/07/18-She is seen in follow-up evaluation for left great toe ulcer. It has stalled according to today's measurements although base appears stable. She says she saw a diabetic educator yesterday; her average blood sugars are less than 300 which is an improvement for her. She continues to smoke and states "that's my next step" She continues with water over soda. We will order for xray, culture and reinstate ace wrap compression prior to placing apligraf for next week. She is voicing no complaints or concerns. Her dressing will change to iodoflex over the next week in preparation for apligraf. 06/14/18-She is seen in follow-up evaluation for left great toe ulcer. Plain film x-ray performed last week was negative for osteomyelitis. Wound culture obtained last week grew strep B  and OSSA; she is initiated on keflex and cefdinir today; there is erythema to the toe which could be from ace wrap compression, she has a history of wrapping too tight and has has been encouraged to maintain ace wraps that we place today. We will hold off on application of apligraf today, will apply next week after antibiotic therapy has been initiated. She admits today that she has resumed taking a shower with her foot/toe submerged in water, she has been reminded to keep foot/toe out of the bath water. She will be seen in follow up next week Electronic Signature(s) Signed: 06/14/2018 2:05:19 PM By: Lawanda Cousins Previous Signature: 06/14/2018 1:22:27 PM Version By: Lawanda Cousins Entered By: Lawanda Cousins on 06/14/2018 14:05:18 AMANADA, PHILBRICK ([id number]) [name] ([id number]) -------------------------------------------------------------------------------- Physician Orders Details Patient Name: Heather Dear A. Date of Service: 06/14/2018 1:30 PM Medical Record Number: [id number] Patient Account Number: [id number] Date of Birth/Sex: [birth year] (42 y.o. F) Treating RN: Ahmed Prima Primary Care Provider: Priscille Kluver Other Clinician: Referring Provider: Priscille Kluver Treating Provider/Extender: Cathie Olden in Treatment: 21 Verbal / Phone Orders: Yes Clinician: Carolyne Fiscal, Debi Read Back and Verified: Yes Diagnosis Coding Wound Cleansing Wound #1 Left Toe Great o Clean wound with Normal Saline. o Cleanse wound with mild soap and water Anesthetic (add to Medication List) Wound #1 Left Toe Great o Topical Lidocaine 4% cream applied to wound bed prior to debridement (In Clinic Only). Primary Wound Dressing Wound #1 Left Toe Great o Iodoflex Secondary Dressing Wound #1 Left Toe Great o Dry Gauze o Conform/Kerlix Dressing Change Frequency Wound #1 Left Toe Great o Change dressing every other day.  Follow-up Appointments Wound #1 Left Toe Great o  Return Appointment in 1 week. Edema Control o Elevate legs to the level of the heart and pump ankles as often as possible o Other: - ace wrap Additional Orders / Instructions Wound #1 Left Toe Great o Increase protein intake. Medications-please add to medication list. Wound #1 Left Toe Great o P.O. Antibiotics - Take antibiotics as prescribed Patient Medications Allergies: erythromycin base Appelhans, Nolah A. ([id number]) Notifications Medication Indication Start End Keflex DOSE oral 500 mg capsule - capsule oral; one capsule three times daily for 7 days cefdinir 06/14/2018 DOSE oral 300 mg capsule - capsule oral; one capsule every 12 hours for 7 days lidocaine DOSE 1 - topical 4 % cream - 1 cream topical Electronic Signature(s) Signed: 06/15/2018 3:54:45 PM By: Alric Quan Signed: 06/26/2018 5:47:54 PM By: Lawanda Cousins Previous Signature: 06/14/2018 10:30:32 AM Version By: Lawanda Cousins Entered By: Alric Quan on 06/14/2018 13:52:52 JAQUELYN, SAKAMOTO A. ([id number]) -------------------------------------------------------------------------------- Prescription 06/14/2018 Patient Name: Heather Dear A. Provider: Lawanda Cousins NP Date of Birth: September 27, 1976 NPI#: [id number] Sex: F DEA#: [id number] Phone #: [phone] License #: Patient Address: [place], [place] [address] [address], [place],  [address] [phone] Allergies erythromycin base Medication Medication: Route: Strength: Form: lidocaine 4 % topical cream topical 4% cream Class: TOPICAL LOCAL ANESTHETICS Dose: Frequency / Time: Indication: 1 1 cream topical Number of Refills: Number of Units: 0 Generic Substitution: Start Date: End Date: One Time Use: Substitution Permitted No Note to Pharmacy: Signature(s): Date(s): Electronic Signature(s) Signed: 06/15/2018 3:54:45 PM By:  Alric Quan Signed: 06/26/2018 5:47:54 PM By: Lawanda Cousins Entered By: Alric Quan on 06/14/2018 13:52:52 Gurry, Nijae A. ([id number]) --------------------------------------------------------------------------------  Problem List Details Patient Name: Heather Dear A. Date of Service: 06/14/2018 1:30 PM Medical Record Number: [id number] Patient Account Number: [id number] Date of Birth/Sex: November 12, 1975 (42 y.o. 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 ([id number]) -------------------------------------------------------------------------------- Progress Note Details Patient Name: Heather Dear A. Date of Service: 06/14/2018 1:30 PM Medical Record Number: [id number] Patient Account Number: [id number] Date of Birth/Sex: May 19, 1976 (42 y.o. F) Treating RN: Ahmed Prima Primary Care Provider: Priscille Kluver Other Clinician: Referring Provider: Priscille Kluver Treating Provider/Extender: Cathie Olden in Treatment: 21 Subjective Chief Complaint Information obtained from Patient She is here for evaluation of a left great toe ulcer History of Present Illness (HPI) 01/18/18-She is here for  initial evaluation of the left great toe ulcer. She is a poor historian in regards to timeframe in detail. She states approximately 4 weeks ago she lacerated her toe on something in the house. She followed up with her primary care who placed her on Bactrim and ultimately a second dose of Bactrim prior to coming to wound clinic. She states she has been treating the toe with peroxide, Betadine  and a Band-Aid. She did not check her blood sugar this morning but checked it yesterday morning it was 327; she is unaware of a recent A1c and there are no current records. She saw Dr. she would've orthopedics last week for an old injury to the left ankle, she states he did not see her toe, nor did she bring it to his attention. She smokes approximately 1 pack cigarettes a day. Her social situation is concerning, she arrives this morning with her mother who appears extremely intoxicated/under the influence; her mother was asked to leave the room and be monitored by the patient's grandmother. The patient's aunt then accompanied the patient and the room throughout the rest of the appointment. We had a lengthy discussion regarding the deleterious effects of uncontrolled hyperglycemia and smoking as it relates to wound healing and overall health. She was strongly encouraged to decrease her smoking and get her diabetes under better control. She states she is currently on a diet and has cut down her [facility] consumption. The left toe is erythematous, macerated and slightly edematous with malodor present. The edema in her left foot is below her baseline, there is no erythema streaking. We will treat her with Santyl, doxycycline; we have ordered and xray, culture and provided a Peg assist surgical shoe and cultured the wound. 01/25/18-She is here in follow-up evaluation for a left great toe ulcer and presents with an abscess to her suprapubic area. She states her blood sugars remain elevated, feeling "sick" and if  levels are below 250, but she is trying. She has made no attempt to decrease her smoking stating that we "can't take away her food in her cigarettes". She has been compliant with offloading using the PEG assist you. She is using Santyl daily. the culture obtained last week grew staph aureus and Enterococcus faecalis; continues on the doxycycline and Augmentin was added on Monday. The suprapubic area has erythema, no femoral variation, purple discoloration, minimal induration, was accessed with a cotton tip applicator with sanguinopurulent drainage, this was cultured, I suspect the current antibiotic treatment will cover and we will not add anything to her current treatment plan. She was advised to go to urgent care or ER with any change in redness, induration or fever. 02/01/18-She is here in follow-up evaluation for left great toe ulcers and a new abdominal abscess from last week. She was able to use packing until earlier this week, where she "forgot it was there". She states she was feeling ill with GI symptoms last week and was not taking her antibiotic. She states her glucose levels have been predominantly less than 200, with occasional levels between 200-250. She thinks this was contributing to her GI symptoms as they have resolved without intervention. There continues to be significant laceration to left toe, otherwise it clinically looks stable/improved. There is now less superficial opening to the lateral aspect of the great toe that was residual blister. We will transition to [facility] to all wounds, she will continue her Augmentin. If there is no change or deterioration next week for reculture. 02/08/18-She is here in follow-up evaluation for left great toe ulcer and abdominal ulcer. There is an improvement in both wounds. She has been wrapping her left toe with coban, not by our direction, which has created an area of discoloration to the medial aspect; she has been advised to NOT use  coban secondary to her neuropathy. She states her glucose levels have been high over this last week ranging from 200-350,  she continues to smoke. She admits to being less compliant with her offloading shoe. We will continue with same treatment plan and she will follow-up next week. 02/15/18-She is here in follow-up evaluation for left great toe ulcer and abdominal ulcer. The abdominal ulcer is epithelialized. The left great toe ulcer is improved and all injury from last week using the Coban wrap is resolved, the lateral ulcer is healed. She admits to noncompliance with wearing offloading shoe and admits to glucose levels being greater than 300 most of the week. She continues to smoke and expresses no desire to quit. There is one area medially that probes deeper than it has historically, erythema to the toe and dorsal foot has consistently waxed and waned. There is no overt signs of cellulitis or infection but we will culture the wound for any occult infection given the new area of depth and erythema. We will hold off on Zumwalt, Indy A. ([id number]) sensitivities for initiation of antibiotic therapy. 02/22/18-She is here in follow up evaluation for left great toe ulcer. There is overall significant improvement in both wound appearance, erythema and edema with changes made last week. She was not initiated on antibiotic therapy. Culture obtained last week showed oxacillin sensitive staph aureus, sensitive to clindamycin. Clindamycin has been called into the pharmacy but she has been instructed to hold off on initiation secondary to overall clinical improvement and her history of antibiotic intolerance. She has been instructed to contact the clinic with any noted changes/deterioration and the wound, erythema, edema and/or pain. She will follow-up next week. She continues to smoke and her glucose levels remain elevated >250; she admits to compliance with offloading shoe 03/01/18 on evaluation today patient  appears to be doing fairly well in regard to her left first toe ulcer. She has been tolerating the dressing changes with the [facility] Dressing without complication and overall this has definitely showed signs of improvement according to records as well is what the patient tells me today. I'm very pleased in that regard. She is having no pain today 03/08/18 She is here for follow up evaluation of a left great toe ulcer. She remains non-compliant with glucose control and smoking cessation; glucose levels consistently >200. She states that she got new shoe inserts/peg assist. She admits to compliance with offloading. Since my last evaluation there is significant improvement. We will switch to prisma at this time and she will follow up next week. She is noted to be tachycardic at this appointment, heart rate 120s; she has a history of heart rate 70-130 according to our records. She admits to extreme agitation r/t personal issues; she was advised to monitor her heartrate and contact her physician if it does not return to a more normal range (<100). She takes cardizem twice daily. 03/15/18-She is here in follow-up evaluation for left great toe ulcer. She remains noncompliant with glucose control and smoking cessation. She admits to compliance with wearing offloading shoe. The ulcer is improved/stable and we will continue with the same treatment plan and she will follow-up next week 03/22/18-She is here for evaluation for left great toe ulcer. There continues to be significant improvement despite recurrent hyperglycemia (over 500 yesterday) and she continues to smoke. She has been compliant with offloading and we will continue with same treatment plan and she will follow-up next week. 03/29/18-She is here for evaluation for left great toe ulcer. Despite continuing to smoke and uncontrolled diabetes she continues to improve. She is compliant with offloading shoe. We will continue  with the same treatment  plan and she will follow-up next week 04/05/18- She is here in follow up evaluation for a left great toe ulcer; she presents with small pustule to left fifth toe (resembles ant bite). She admits to compliance with wearing offloading shoe; continues to smoke or have uncontrolled blood glucose control. There is more callus than usual with evidence of bleeding; she denies known trauma. 04/12/18-She is here for evaluation of left great toe ulcer. Despite noncompliance with glycemic control and smoking she continues to make improvement. She continues to wear offloading shoe. The pustule, that was identified last week, to the left fifth toe is resolved. She will follow-up in 2 weeks 05/03/18-she is seen in follow-up evaluation for a left great toe ulcer. She is compliant with offloading, otherwise noncompliant with glycemic control and smoking. She has plateaued and there is minimal improvement noted. We will transition to [facility], replaced the insert to her surgical shoe and she will follow-up in one week 05/10/18- She is here in follow up evaluation for a left great toe ulcer. It appears stable despite measurement change. We will continue with same treatment plan and follow up next week. 05/24/18-She is seen in follow-up evaluation for a left great toe ulcer. She remains compliant with offloading, has made significant improvement in her diet, decreasing the amount of sugar/soda. She said her recent A1c was 10.9 which is lower than. She did see a diabetic nutritionist/educator yesterday. She continues to smoke. We will continue with the same treatment plan and she'll follow-up next week. 05/31/18- She is seen in follow-up evaluation for left great toe ulcer. She continues to remain compliant with offloading, continues to make improvement in her diet, increasing her water and decreasing the amount of sugar/soda. She does continue to smoke with no desire to quit. We will apply Prisma to the depth and  Hydrofera Blue over. We have not received insurance authorization for oasis. She will follow up next week. 06/07/18-She is seen in follow-up evaluation for left great toe ulcer. It has stalled according to today's measurements although base appears stable. She says she saw a diabetic educator yesterday; her average blood sugars are less than 300 which is an improvement for her. She continues to smoke and states "that's my next step" She continues with water over soda. We will order for xray, culture and reinstate ace wrap compression prior to placing apligraf for next week. She is voicing no complaints or concerns. Her dressing will change to iodoflex over the next week in preparation for apligraf. 06/14/18-She is seen in follow-up evaluation for left great toe ulcer. Plain film x-ray performed last week was negative for osteomyelitis. Wound culture obtained last week grew strep B and OSSA; she is initiated on keflex and cefdinir today; there is erythema to the toe which could be from ace wrap compression, she has a history of wrapping too tight and has has been encouraged to maintain ace wraps that we place today. We will hold off on application of apligraf today, will apply next week after antibiotic therapy has been initiated. She admits today that she has resumed taking a shower with her foot/toe submerged in water, she has been reminded to keep foot/toe out of the bath water. She will be seen in follow up next week Vosler, Berline A. ([id number]) Objective Constitutional Vitals Time Taken: 1:35 PM, Height: 69 in, Weight: 250 lbs, BMI: 36.9, Temperature: 98.6 F, Pulse: 115 bpm, Respiratory Rate: 16 breaths/min, Blood Pressure: 134/95 mmHg. Integumentary (Hair,  Skin) Wound #1 status is Open. Original cause of wound was Gradually Appeared. The wound is located on the Left Toe Great. The wound measures 0.4cm length x 0.3cm width x 0.3cm depth; 0.094cm^2 area and 0.028cm^3 volume. There is Fat  Layer (Subcutaneous Tissue) Exposed exposed. There is no tunneling or undermining noted. There is a small amount of serosanguineous drainage noted. The wound margin is flat and intact. There is large (67-100%) red granulation within the wound bed. There is a small (1-33%) amount of necrotic tissue within the wound bed including Adherent Slough. The periwound skin appearance exhibited: Callus, Maceration, Erythema. The periwound skin appearance did not exhibit: Crepitus, Excoriation, Induration, Rash, Scarring, Dry/Scaly, Atrophie Blanche, Cyanosis, Ecchymosis, Hemosiderin Staining, Mottled, Pallor, Rubor. The surrounding wound skin color is noted with erythema which is circumferential. Periwound temperature was noted as No Abnormality. Assessment Active Problems ICD-10 Type 2 diabetes mellitus with foot ulcer Non-pressure chronic ulcer of other part of left foot limited to breakdown of skin Procedures Wound #1 Pre-procedure diagnosis of Wound #1 is a Diabetic Wound/Ulcer of the Lower Extremity located on the Left Toe Great .Severity of Tissue Pre Debridement is: Fat layer exposed. There was a Excisional Skin/Subcutaneous Tissue Debridement with a total area of 0.12 sq cm performed by Lawanda Cousins, NP. With the following instrument(s): Curette to remove Viable and Non-Viable tissue/material. Material removed includes Callus, Subcutaneous Tissue, Slough, and Fibrin/Exudate after achieving pain control using Other (lidocaine 4%). No specimens were taken. A time out was conducted at 13:47, prior to the start of the procedure. A Minimum amount of bleeding was controlled with Pressure. The procedure was tolerated well with a pain level of 0 throughout and a pain level of 0 following the procedure. Patient s Level of Consciousness post procedure was recorded as Awake and Alert. Post Debridement Measurements: 0.4cm length x 0.3cm width x 0.4cm depth; 0.038cm^3 volume. Character of Wound/Ulcer Post  Debridement is stable. Severity of Tissue Post Debridement is: Fat layer exposed. Post procedure Diagnosis Wound #1: Same as Pre-Procedure Froemming, Jenet A. ([id number]) Plan Wound Cleansing: Wound #1 Left Toe Great: Clean wound with Normal Saline. Cleanse wound with mild soap and water Anesthetic (add to Medication List): Wound #1 Left Toe Great: Topical Lidocaine 4% cream applied to wound bed prior to debridement (In Clinic Only). Primary Wound Dressing: Wound #1 Left Toe Great: Iodoflex Secondary Dressing: Wound #1 Left Toe Great: Dry Gauze Conform/Kerlix Dressing Change Frequency: Wound #1 Left Toe Great: Change dressing every other day. Follow-up Appointments: Wound #1 Left Toe Great: Return Appointment in 1 week. Edema Control: Elevate legs to the level of the heart and pump ankles as often as possible Other: - ace wrap Additional Orders / Instructions: Wound #1 Left Toe Great: Increase protein intake. Medications-please add to medication list.: Wound #1 Left Toe Great: P.O. Antibiotics - Take antibiotics as prescribed The following medication(s) was prescribed: Keflex oral 500 mg capsule capsule oral; one capsule three times daily for 7 days cefdinir oral 300 mg capsule capsule oral; one capsule every 12 hours for 7 days starting 06/14/2018 lidocaine topical 4 % cream 1 1 cream topical was prescribed at facility Electronic Signature(s) Signed: 06/14/2018 2:05:45 PM By: Lawanda Cousins Entered By: Lawanda Cousins on 06/14/2018 14:05:45 Robello, Gissela A. ([id number]) -------------------------------------------------------------------------------- [facility] Details Patient Name: Heather Dear A. Date of Service: 06/14/2018 Medical Record Number: [id number] Patient Account Number: [id number] Date of Birth/Sex: 1976/06/08 (42 y.o. F) Treating RN: Ahmed Prima Primary Care Provider: Priscille Kluver Other Clinician: Referring Provider:  DAVIS, DEBRA Treating Provider/Extender:  Lawanda Cousins Weeks in Treatment: 21 Diagnosis Coding ICD-10 Codes Code Description E11.621 Type 2 diabetes mellitus with foot ulcer L97.521 Non-pressure chronic ulcer of other part of left foot limited to breakdown of skin Facility Procedures CPT4 Code Description: [id number] 11042 - DEB SUBQ TISSUE 20 SQ CM/< ICD-10 Diagnosis Description L97.521 Non-pressure chronic ulcer of other part of left foot limited t Modifier: o breakdown of s Quantity: 1 kin Physician Procedures CPT4 Code Description: [id number] - WC PHYS SUBQ TISS 20 SQ CM ICD-10 Diagnosis Description L97.521 Non-pressure chronic ulcer of other part of left foot limited t Modifier: o breakdown of sk Quantity: 1 in Electronic Signature(s) Signed: 06/14/2018 2:07:35 PM By: Lawanda Cousins Entered By: Lawanda Cousins on 06/14/2018 14:07:35

## 2018-06-26 NOTE — Progress Notes (Signed)
[name] ([id number]) Visit Report for 06/14/2018 Arrival Information Details Patient Name: [name], [name] Date of Service: 06/14/2018 1:30 PM Medical Record Number: [id number] Patient Account Number: [id number] Date of Birth/Sex: April 18, 1976 (42 y.o. F) Treating RN: [name] Primary Care [name]: [name] Other Clinician: Referring [name]: [name] Treating [name]/Extender: [name] in Treatment: 21 Visit Information History Since Last Visit Added or deleted any medications: No Patient Arrived: Wheel Chair Any new allergies or adverse reactions: No Arrival Time: 13:35 Had a fall or experienced change in No activities of daily living that may affect Accompanied By: mother risk of falls: Transfer Assistance: None Signs or symptoms of abuse/neglect since last visito No Patient Identification Verified: Yes Hospitalized since last visit: No Secondary Verification Process Completed: Yes Implantable device outside of the clinic excluding No Patient Requires Transmission-Based No cellular tissue based products placed in the center Precautions: since last visit: Patient Has Alerts: Yes Has Dressing in Place as Prescribed: Yes Patient Alerts: DMII Pain Present Now: No Electronic Signature(s) Signed: 06/15/2018 4:18:40 PM By: [name] Entered By: [name] on 06/14/2018 13:35:41 [name] ([id number]) -------------------------------------------------------------------------------- Encounter Discharge Information Details Patient Name: Heather Dear A. Date of Service: 06/14/2018 1:30 PM Medical Record Number: [id number] Patient Account Number: [id number] Date of Birth/Sex: [birth year] (42 y.o. F) Treating RN: [name] Primary Care Kasra Melvin: [name] Other Clinician: Referring [name]: [name] Treating Sheylin Scharnhorst/Extender: [name] in Treatment: 21 Encounter Discharge Information Items Discharge  Condition: Stable Ambulatory Status: Wheelchair Discharge Destination: Home Transportation: Private Auto Schedule Follow-up Appointment: Yes Clinical Summary of Care: Electronic Signature(s) Signed: 06/14/2018 4:42:32 PM By: [name] Entered By: [name] on 06/14/2018 13:58:40 [name] A. ([id number]) -------------------------------------------------------------------------------- Lower Extremity Assessment Details Patient Name: Heather Dear A. Date of Service: 06/14/2018 1:30 PM Medical Record Number: [id number] Patient Account Number: [id number] Date of Birth/Sex: [birth year] (42 y.o. F) Treating RN: [name] Primary Care Danel Requena: [name] Other Clinician: Referring [name]: [name] Treating [name]/Extender: [name] in Treatment: 21 Vascular Assessment Pulses: Dorsalis Pedis Palpable: [Left:Yes] Posterior Tibial Extremity colors, hair growth, and conditions: Extremity Color: [Left:Normal] Hair Growth on Extremity: [Left:Yes] Temperature of Extremity: [Left:Warm] Capillary Refill: [Left:< 3 seconds] Toe Nail Assessment Left: Right: Thick: Yes Discolored: Yes Deformed: No Improper Length and Hygiene: No Electronic Signature(s) Signed: 06/15/2018 4:18:40 PM By: [name] Entered By: [name] on 06/14/2018 13:42:45 [name] A. ([id number]) -------------------------------------------------------------------------------- Multi Wound Chart Details Patient Name: Heather Dear A. Date of Service: 06/14/2018 1:30 PM Medical Record Number: [id number] Patient Account Number: [id number] Date of Birth/Sex: Jul 19, 1976 (42 y.o. F) Treating RN: [name] Primary Care [name]: [name] Other Clinician: Referring [name]: [name] Treating [name]/Extender: [name] in Treatment: 21 Vital Signs Height(in): 24 Pulse(bpm): 115 Weight(lbs): 250 Blood Pressure(mmHg): 134/95 Body Mass  Index(BMI): 37 Temperature(F): 98.6 Respiratory Rate 16 (breaths/min): Photos: [1:No Photos] [N/A:N/A] Wound Location: [1:Left Toe Great] [N/A:N/A] Wounding Event: [1:Gradually Appeared] [N/A:N/A] Primary Etiology: [1:Diabetic Wound/Ulcer of the N/A Lower Extremity] Comorbid History: [1:Chronic Obstructive Pulmonary Disease (COPD), Congestive Heart Failure, Hypertension, Type II Diabetes, Neuropathy, Seizure Disorder] [N/A:N/A] Date Acquired: [1:01/01/2018] [N/A:N/A] Weeks of Treatment: [1:21] [N/A:N/A] Wound Status: [1:Open] [N/A:N/A] Pending Amputation on [1:Yes] [N/A:N/A] Presentation: Measurements L x W x D [1:0.4x0.3x0.3] [N/A:N/A] (cm) Area (cm) : [1:0.094] [N/A:N/A] Volume (cm) : [1:0.028] [N/A:N/A] % Reduction in Area: [1:98.80%] [N/A:N/A] % Reduction in Volume: [1:99.10%] [N/A:N/A] Classification: [1:Grade 2] [N/A:N/A] Exudate Amount: [1:Small] [N/A:N/A] Exudate Type: [1:Serosanguineous] [N/A:N/A] Exudate Color: [1:red, brown] [  N/A:N/A] Wound Margin: [1:Flat and Intact] [N/A:N/A] Granulation Amount: [1:Large (67-100%)] [N/A:N/A] Granulation Quality: [1:Red] [N/A:N/A] Necrotic Amount: [1:Small (1-33%)] [N/A:N/A] Exposed Structures: [1:Fat Layer (Subcutaneous Tissue) Exposed: Yes Fascia: No Tendon: No Muscle: No Joint: No Bone: No] [N/A:N/A] Epithelialization: None N/A N/A Periwound Skin Texture: Callus: Yes N/A N/A Excoriation: No Induration: No Crepitus: No Rash: No Scarring: No Periwound Skin Moisture: Maceration: Yes N/A N/A Dry/Scaly: No Periwound Skin Color: Erythema: Yes N/A N/A Atrophie Blanche: No Cyanosis: No Ecchymosis: No Hemosiderin Staining: No Mottled: No Pallor: No Rubor: No Erythema Location: Circumferential N/A N/A Temperature: No Abnormality N/A N/A Tenderness on Palpation: No N/A N/A Wound Preparation: Ulcer Cleansing: N/A N/A Rinsed/Irrigated with Saline Topical Anesthetic Applied: Other: lidocaine 4% Treatment  Notes Electronic Signature(s) Signed: 06/15/2018 3:54:45 PM By: Alric Quan Entered By: Alric Quan on 06/14/2018 13:47:13 [name] -------------------------------------------------------------------------------- [place] Details Patient Name: Heather Dear A. Date of Service: 06/14/2018 1:30 PM Medical Record Number: [id number] Patient Account Number: [id number] Date of Birth/Sex: November 01, 1975 (42 y.o. F) Treating RN: [name] Primary Care Paddy Walthall: [name] Other Clinician: Referring Alajiah Dutkiewicz: [name] Treating Debbora Ang/Extender: [name] in Treatment: 21 Active Inactive ` Abuse / Safety / Falls / Self Care Management Nursing Diagnoses: Potential for falls Goals: Patient will not experience any injury related to falls Date Initiated: 01/18/2018 Target Resolution Date: 04/28/2018 Goal Status: Active Interventions: Assess fall risk on admission and as needed Assess: immobility, friction, shearing, incontinence upon admission and as needed Assess impairment of mobility on admission and as needed per policy Assess personal safety and home safety (as indicated) on admission and as needed Assess self care needs on admission and as needed Notes: ` Nutrition Nursing Diagnoses: Imbalanced nutrition Impaired glucose control: actual or potential Potential for alteratiion in Nutrition/Potential for imbalanced nutrition Goals: Patient/caregiver agrees to and verbalizes understanding of need to use nutritional supplements and/or vitamins as prescribed Date Initiated: 01/18/2018 Target Resolution Date: 04/28/2018 Goal Status: Active Patient/caregiver will maintain therapeutic glucose control Date Initiated: 01/18/2018 Target Resolution Date: 04/28/2018 Goal Status: Active Interventions: Assess patient nutrition upon admission and as needed per policy Provide education on elevated blood sugars and impact on wound  healing Notes: [place], [place]B ([id number]) Orientation to the Wound Care Program Nursing Diagnoses: Knowledge deficit related to the wound healing center program Goals: Patient/caregiver will verbalize understanding of the [place] Date Initiated: 01/18/2018 Target Resolution Date: 01/27/2018 Goal Status: Active Interventions: Provide education on orientation to the wound center Notes: ` Wound/Skin Impairment Nursing Diagnoses: Impaired tissue integrity Knowledge deficit related to smoking impact on wound healing Knowledge deficit related to ulceration/compromised skin integrity Goals: Ulcer/skin breakdown will have a volume reduction of 80% by week 12 Date Initiated: 01/18/2018 Target Resolution Date: 04/21/2018 Goal Status: Active Interventions: Assess patient/caregiver ability to perform ulcer/skin care regimen upon admission and as needed Assess ulceration(s) every visit Notes: Electronic Signature(s) Signed: 06/15/2018 3:54:45 PM By: Alric Quan Entered By: Alric Quan on 06/14/2018 13:47:07 Brashier, Deretha A. ([id number]) -------------------------------------------------------------------------------- Pain Assessment Details Patient Name: Heather Dear A. Date of Service: 06/14/2018 1:30 PM Medical Record Number: [id number] Patient Account Number: [id number] Date of Birth/Sex: 1976-10-21 (42 y.o. F) Treating RN: [name] Primary Care Maley Venezia: [name] Other Clinician: Referring Byrd Rushlow: [name] Treating Mykaila Blunck/Extender: [name] in Treatment: 21 Active Problems Location of Pain Severity and Description of Pain Patient Has Paino No Site Locations Pain Management and Medication Current Pain Management: Electronic Signature(s) Signed: 06/15/2018 4:18:40 PM By:  Dorthy, Di Kindle Entered By: [name] on 06/14/2018 13:35:49 [name]  ([id number]) -------------------------------------------------------------------------------- Patient/Caregiver Education Details Patient Name: Heather Dear A. Date of Service: 06/14/2018 1:30 PM Medical Record Number: [id number] Patient Account Number: [id number] Date of Birth/Gender: [birth year] (42 y.o. F) Treating RN: [name] Primary Care Physician: [name] Other Clinician: Referring Physician: Suzan [name] Treating Physician/Extender: [name] in Treatment: 21 Education Assessment Education Provided To: Patient Education Topics Provided Wound Debridement: Handouts: Wound Debridement Methods: Explain/Verbal Responses: State content correctly Wound/Skin Impairment: Handouts: Caring for Your Ulcer Methods: Explain/Verbal Responses: State content correctly Electronic Signature(s) Signed: 06/14/2018 4:42:32 PM By: [name] Entered By: [name] on 06/14/2018 13:58:54 Teters, Nasiyah A. ([id number]) -------------------------------------------------------------------------------- Wound Assessment Details Patient Name: Heather Dear A. Date of Service: 06/14/2018 1:30 PM Medical Record Number: [id number] Patient Account Number: [id number] Date of Birth/Sex: July 05, 1976 (42 y.o. F) Treating RN: [name] Primary Care Deazia Lampi: [name] Other Clinician: Referring Balinda Heacock: [name] Treating Sahmya Arai/Extender: [name] in Treatment: 21 Wound Status Wound Number: 1 Primary Diabetic Wound/Ulcer of the Lower Extremity Etiology: Wound Location: Left Toe Great Wound Open Wounding Event: Gradually Appeared Status: Date Acquired: 01/01/2018 Comorbid Chronic Obstructive Pulmonary Disease Weeks Of Treatment: 21 History: (COPD), Congestive Heart Failure, Clustered Wound: No Hypertension, Type II Diabetes, Neuropathy, Pending Amputation On Presentation Seizure Disorder Photos Photo Uploaded By: [name] on 06/14/2018  13:55:05 Wound Measurements Length: (cm) 0.4 Width: (cm) 0.3 Depth: (cm) 0.3 Area: (cm) 0.094 Volume: (cm) 0.028 % Reduction in Area: 98.8% % Reduction in Volume: 99.1% Epithelialization: None Tunneling: No Undermining: No Wound Description Classification: Grade 2 Wound Margin: Flat and Intact Exudate Amount: Small Exudate Type: Serosanguineous Exudate Color: red, brown Foul Odor After Cleansing: No Slough/Fibrino Yes Wound Bed Granulation Amount: Large (67-100%) Exposed Structure Granulation Quality: Red Fascia Exposed: No Necrotic Amount: Small (1-33%) Fat Layer (Subcutaneous Tissue) Exposed: Yes Necrotic Quality: Adherent Slough Tendon Exposed: No Muscle Exposed: No Joint Exposed: No Bone Exposed: No Cumber, Ciena A. ([id number]) Periwound Skin Texture Texture Color No Abnormalities Noted: No No Abnormalities Noted: No Callus: Yes Atrophie Blanche: No Crepitus: No Cyanosis: No Excoriation: No Ecchymosis: No Induration: No Erythema: Yes Rash: No Erythema Location: Circumferential Scarring: No Hemosiderin Staining: No Mottled: No Moisture Pallor: No No Abnormalities Noted: No Rubor: No Dry / Scaly: No Maceration: Yes Temperature / Pain Temperature: No Abnormality Wound Preparation Ulcer Cleansing: Rinsed/Irrigated with Saline Topical Anesthetic Applied: Other: lidocaine 4%, Treatment Notes Wound #1 (Left Toe Great) 1. Cleansed with: Clean wound with Normal Saline 2. Anesthetic Topical Lidocaine 4% cream to wound bed prior to debridement 4. Dressing Applied: Iodoflex 5. Secondary Dressing Applied Dry Gauze Kerlix/Conform Notes ace wrap and off loading shoe Electronic Signature(s) Signed: 06/15/2018 4:18:40 PM By: [name] Entered By: [name] on 06/14/2018 13:42:15 Ellegood, Arnett A. ([id number]) -------------------------------------------------------------------------------- [place] Details Patient Name: Heather Dear  A. Date of Service: 06/14/2018 1:30 PM Medical Record Number: [id number] Patient Account Number: [id number] Date of Birth/Sex: [birth year] (42 y.o. F) Treating RN: [name] Primary Care Emaan Gary: [name] Other Clinician: Referring Lilyona Richner: [name] Treating Charlcie Prisco/Extender: [name] in Treatment: 21 Vital Signs Time Taken: 13:35 Temperature (F): 98.6 Height (in): 69 Pulse (bpm): 115 Weight (lbs): 250 Respiratory Rate (breaths/min): 16 Body Mass Index (BMI): 36.9 Blood Pressure (mmHg): 134/95 Reference Range: 80 - 120 mg / dl Electronic Signature(s) Signed: 06/15/2018 4:18:40 PM By: [name] Entered By: [name] on 06/14/2018 13:36:59

## 2018-06-28 ENCOUNTER — Encounter: Payer: Medicaid Other | Attending: Nurse Practitioner | Admitting: Nurse Practitioner

## 2018-06-28 DIAGNOSIS — L97521 Non-pressure chronic ulcer of other part of left foot limited to breakdown of skin: Secondary | ICD-10-CM | POA: Insufficient documentation

## 2018-06-28 DIAGNOSIS — L03032 Cellulitis of left toe: Secondary | ICD-10-CM | POA: Diagnosis not present

## 2018-06-28 DIAGNOSIS — E1165 Type 2 diabetes mellitus with hyperglycemia: Secondary | ICD-10-CM | POA: Insufficient documentation

## 2018-06-28 DIAGNOSIS — F17210 Nicotine dependence, cigarettes, uncomplicated: Secondary | ICD-10-CM | POA: Insufficient documentation

## 2018-06-28 DIAGNOSIS — L02211 Cutaneous abscess of abdominal wall: Secondary | ICD-10-CM | POA: Insufficient documentation

## 2018-06-28 DIAGNOSIS — E11621 Type 2 diabetes mellitus with foot ulcer: Secondary | ICD-10-CM | POA: Insufficient documentation

## 2018-06-29 DIAGNOSIS — N921 Excessive and frequent menstruation with irregular cycle: Secondary | ICD-10-CM | POA: Insufficient documentation

## 2018-07-01 NOTE — Progress Notes (Signed)
[name], [name] ([id number]) Visit Report for 06/28/2018 Arrival Information Details Patient Name: [name], [name] Date of Service: 06/28/2018 11:15 AM Medical Record Number: [id number] Patient Account Number: [id number] Date of Birth/Sex: 1976-06-26 (42 y.o. F) Treating RN: [name] Primary Care [name]: [name] Other Clinician: Referring [name]: [name] Treating [name]/Extender: [name] in Treatment: 23 Visit Information History Since Last Visit Added or deleted any medications: No Patient Arrived: Wheel Chair Any new allergies or adverse reactions: No Arrival Time: 11:28 Had a fall or experienced change in No activities of daily living that may affect Accompanied By: father risk of falls: Transfer Assistance: None Signs or symptoms of abuse/neglect since last visito No Patient Identification Verified: Yes Hospitalized since last visit: No Secondary Verification Process Completed: Yes Implantable device outside of the clinic excluding No Patient Requires Transmission-Based No cellular tissue based products placed in the center Precautions: since last visit: Patient Has Alerts: Yes Has Dressing in Place as Prescribed: Yes Patient Alerts: DMII Pain Present Now: No Electronic Signature(s) Signed: 06/28/2018 4:37:30 PM By: [name] RCP, RRT, CHT Entered By: [name] on 06/28/2018 11:29:31 Spicer, [name] ([id number]) -------------------------------------------------------------------------------- Clinic Level of Care Assessment Details Patient Name: Heather Dear A. Date of Service: 06/28/2018 11:15 AM Medical Record Number: [id number] Patient Account Number: [id number] Date of Birth/Sex: 1975-11-16 (42 y.o. F) Treating RN: [name] Primary Care [name]: [name] Other Clinician: Referring [name]: [name] Treating [name]/Extender: [name] in Treatment: 23 Clinic Level  of Care Assessment Items TOOL 4 Quantity Score X - Use when only an EandM is performed on FOLLOW-UP visit 1 0 ASSESSMENTS - Nursing Assessment / Reassessment X - Reassessment of Co-morbidities (includes updates in patient status) 1 10 X- 1 5 Reassessment of Adherence to Treatment Plan ASSESSMENTS - Wound and Skin Assessment / Reassessment X - Simple Wound Assessment / Reassessment - one wound 1 5 []  - 0 Complex Wound Assessment / Reassessment - multiple wounds []  - 0 Dermatologic / Skin Assessment (not related to wound area) ASSESSMENTS - Focused Assessment []  - Circumferential Edema Measurements - multi extremities 0 []  - 0 Nutritional Assessment / Counseling / Intervention []  - 0 Lower Extremity Assessment (monofilament, tuning fork, pulses) []  - 0 Peripheral Arterial Disease Assessment (using hand held doppler) ASSESSMENTS - Ostomy and/or Continence Assessment and Care []  - Incontinence Assessment and Management 0 []  - 0 Ostomy Care Assessment and Management (repouching, etc.) PROCESS - Coordination of Care X - Simple Patient / Family Education for ongoing care 1 15 []  - 0 Complex (extensive) Patient / Family Education for ongoing care []  - 0 Staff obtains Programmer, systems, Records, Test Results / Process Orders []  - 0 Staff telephones HHA, Nursing Homes / Clarify orders / etc []  - 0 Routine Transfer to another Facility (non-emergent condition) []  - 0 Routine Hospital Admission (non-emergent condition) []  - 0 New Admissions / Biomedical engineer / Ordering NPWT, Apligraf, etc. []  - 0 Emergency Hospital Admission (emergent condition) X- 1 10 Simple Discharge Coordination [name], [name] ([id number]) []  - 0 Complex (extensive) Discharge Coordination PROCESS - Special Needs []  - Pediatric / Minor Patient Management 0 []  - 0 Isolation Patient Management []  - 0 Hearing / Language / Visual special needs []  - 0 Assessment of Community assistance (transportation, D/C  planning, etc.) []  - 0 Additional assistance / Altered mentation []  - 0 Support Surface(s) Assessment (bed, cushion, seat, etc.) INTERVENTIONS - Wound Cleansing / Measurement X - Simple Wound Cleansing - one  wound 1 5 []  - 0 Complex Wound Cleansing - multiple wounds X- 1 5 Wound Imaging (photographs - any number of wounds) []  - 0 Wound Tracing (instead of photographs) X- 1 5 Simple Wound Measurement - one wound []  - 0 Complex Wound Measurement - multiple wounds INTERVENTIONS - Wound Dressings X - Small Wound Dressing one or multiple wounds 1 10 []  - 0 Medium Wound Dressing one or multiple wounds []  - 0 Large Wound Dressing one or multiple wounds []  - 0 Application of Medications - topical []  - 0 Application of Medications - injection INTERVENTIONS - Miscellaneous []  - External ear exam 0 []  - 0 Specimen Collection (cultures, biopsies, blood, body fluids, etc.) []  - 0 Specimen(s) / Culture(s) sent or taken to Lab for analysis []  - 0 Patient Transfer (multiple staff / Civil Service fast streamer / Similar devices) []  - 0 Simple Staple / Suture removal (25 or less) []  - 0 Complex Staple / Suture removal (26 or more) []  - 0 Hypo / Hyperglycemic Management (close monitor of Blood Glucose) []  - 0 Ankle / Brachial Index (ABI) - do not check if billed separately X- 1 5 Vital Signs [name], [name] ([id number]) Has the patient been seen at the hospital within the last three years: Yes Total Score: 75 Level Of Care: New/Established - Level 2 Electronic Signature(s) Signed: 06/28/2018 2:41:32 PM By: [name] Entered By: [name] on 06/28/2018 11:59:26 [name], [name] ([id number]) -------------------------------------------------------------------------------- Lower Extremity Assessment Details Patient Name: Heather Dear A. Date of Service: 06/28/2018 11:15 AM Medical Record Number: [id number] Patient Account Number: [id number] Date of Birth/Sex: 02-01-1976 (42 y.o.  F) Treating RN: Montey Hora Primary Care Dunia [name]: [name] Other Clinician: Referring Pairlee Sawtell: [name] Treating [name]/Extender: [name] in Treatment: 23 Vascular Assessment Pulses: Dorsalis Pedis Palpable: [Left:Yes] Posterior Tibial Extremity colors, hair growth, and conditions: Extremity Color: [Left:Normal] Hair Growth on Extremity: [Left:Yes] Temperature of Extremity: [Left:Warm] Capillary Refill: [Left:< 3 seconds] Toe Nail Assessment Left: Right: Thick: Yes Discolored: No Deformed: No Improper Length and Hygiene: No Electronic Signature(s) Signed: 06/28/2018 5:08:53 PM By: Montey Hora Entered By: Montey Hora on 06/28/2018 11:40:54 Lastra, [name] ([id number]) -------------------------------------------------------------------------------- Multi Wound Chart Details Patient Name: Heather Dear A. Date of Service: 06/28/2018 11:15 AM Medical Record Number: [id number] Patient Account Number: [id number] Date of Birth/Sex: 1976-10-18 (42 y.o. F) Treating RN: [name] Primary Care Denesha Brouse: [name] Other Clinician: Referring Yanai Hobson: [name] Treating Itzell Bendavid/Extender: [name] in Treatment: 23 Vital Signs Height(in): 38 Pulse(bpm): 96 Weight(lbs): 250 Blood Pressure(mmHg): 104/72 Body Mass Index(BMI): 37 Temperature(F): 99.2 Respiratory Rate 16 (breaths/min): Photos: [1:No Photos] [N/A:N/A] Wound Location: [1:Left Toe Great] [N/A:N/A] Wounding Event: [1:Gradually Appeared] [N/A:N/A] Primary Etiology: [1:Diabetic Wound/Ulcer of the N/A Lower Extremity] Comorbid History: [1:Chronic Obstructive Pulmonary Disease (COPD), Congestive Heart Failure, Hypertension, Type II Diabetes, Neuropathy, Seizure Disorder] [N/A:N/A] Date Acquired: [1:01/01/2018] [N/A:N/A] Weeks of Treatment: [1:23] [N/A:N/A] Wound Status: [1:Open] [N/A:N/A] Pending Amputation on [1:Yes] [N/A:N/A] Presentation: Measurements L x W x D  [1:0.1x0.1x0.1] [N/A:N/A] (cm) Area (cm) : [1:0.008] [N/A:N/A] Volume (cm) : [1:0.001] [N/A:N/A] % Reduction in Area: [1:99.90%] [N/A:N/A] % Reduction in Volume: [1:100.00%] [N/A:N/A] Classification: [1:Grade 2] [N/A:N/A] Exudate Amount: [1:None Present] [N/A:N/A] Wound Margin: [1:Flat and Intact] [N/A:N/A] Granulation Amount: [1:None Present (0%)] [N/A:N/A] Necrotic Amount: [1:None Present (0%)] [N/A:N/A] Exposed Structures: [1:Fat Layer (Subcutaneous Tissue) Exposed: Yes Fascia: No Tendon: No Muscle: No Joint: No Bone: No] [N/A:N/A] Epithelialization: [1:Large (67-100%)] [N/A:N/A] Periwound Skin Texture: [1:Callus: Yes Excoriation: No] [N/A:N/A] Induration: No Crepitus:  No Rash: No Scarring: No Periwound Skin Moisture: Maceration: Yes N/A N/A Dry/Scaly: No Periwound Skin Color: Atrophie Blanche: No N/A N/A Cyanosis: No Ecchymosis: No Erythema: No Hemosiderin Staining: No Mottled: No Pallor: No Rubor: No Temperature: No Abnormality N/A N/A Tenderness on Palpation: No N/A N/A Wound Preparation: Ulcer Cleansing: N/A N/A Rinsed/Irrigated with Saline Topical Anesthetic Applied: Other: lidocaine 4% Treatment Notes Electronic Signature(s) Signed: 06/28/2018 11:59:33 AM By: Lawanda Cousins Entered By: Lawanda Cousins on 06/28/2018 11:59:33 [name] ([id number]) -------------------------------------------------------------------------------- [facility] Details Patient Name: Heather Dear A. Date of Service: 06/28/2018 11:15 AM Medical Record Number: [id number] Patient Account Number: [id number] Date of Birth/Sex: 01/15/1976 (42 y.o. F) Treating RN: [name] Primary Care Jaimon Bugaj: [name] Other Clinician: Referring Salathiel Ferrara: [name] Treating Lorelie Biermann/Extender: [name] in Treatment: 52 Active Inactive Electronic Signature(s) Signed: 06/28/2018 12:53:31 PM By: [name] Entered By: [name] on 06/28/2018  12:53:31 Bubolz, [name] Kitchen ([id number]) -------------------------------------------------------------------------------- Pain Assessment Details Patient Name: Heather Dear A. Date of Service: 06/28/2018 11:15 AM Medical Record Number: [id number] Patient Account Number: [id number] Date of Birth/Sex: [birth year] (42 y.o. F) Treating RN: [name] Primary Care Saran Laviolette: [name] Other Clinician: Referring Alexsandra Shontz: [name] Treating Cassandra Mcmanaman/Extender: [name] in Treatment: 23 Active Problems Location of Pain Severity and Description of Pain Patient Has Paino No Site Locations Pain Management and Medication Current Pain Management: Electronic Signature(s) Signed: 06/28/2018 2:41:32 PM By: [name] Signed: 06/28/2018 4:37:30 PM By: Becky Sax, [name] RCP, RRT, CHT Entered By: [name] on 06/28/2018 11:29:40 [name] ([id number]) -------------------------------------------------------------------------------- Patient/Caregiver Education Details Patient Name: Heather Dear A. Date of Service: 06/28/2018 11:15 AM Medical Record Number: [id number] Patient Account Number: [id number] Date of Birth/Gender: [birth year] (42 y.o. F) Treating RN: Cornell Barman Primary Care Physician: [name] Other Clinician: Referring Physician: Priscille [name] Treating Physician/Extender: [name] in Treatment: 60 Education Assessment Education Provided To: Patient Education Topics Provided Wound/Skin Impairment: Handouts: Caring for Your Ulcer, Other: pritect area and wear surgical shoe for 4 more weeks Methods: Explain/Verbal Responses: State content correctly Electronic Signature(s) Signed: 06/28/2018 5:22:10 PM By: Gretta Cool, BSN, RN, CWS, [name] RN, BSN Entered By: Gretta Cool, BSN, RN, CWS, [name] on 06/28/2018 14:40:22 [name] ([id number]) -------------------------------------------------------------------------------- Wound  Assessment Details Patient Name: Heather Dear A. Date of Service: 06/28/2018 11:15 AM Medical Record Number: [id number] Patient Account Number: [id number] Date of Birth/Sex: [birth year] (42 y.o. F) Treating RN: [name] Primary Care Rodriques Badie: [name] Other Clinician: Referring Alexiana Laverdure: [name] Treating Carletha Dawn/Extender: [name] in Treatment: 23 Wound Status Wound Number: 1 Primary Diabetic Wound/Ulcer of the Lower Extremity Etiology: Wound Location: Left Toe Great Wound Healed - Epithelialized Wounding Event: Gradually Appeared Status: Date Acquired: 01/01/2018 Comorbid Chronic Obstructive Pulmonary Disease Weeks Of Treatment: 23 History: (COPD), Congestive Heart Failure, Clustered Wound: No Hypertension, Type II Diabetes, Neuropathy, Pending Amputation On Presentation Seizure Disorder Photos Photo Uploaded By: Montey Hora on 06/28/2018 12:59:39 Wound Measurements Length: (cm) 0 Width: (cm) 0 Depth: (cm) 0 Area: (cm) 0 Volume: (cm) 0 % Reduction in Area: 100% % Reduction in Volume: 100% Epithelialization: Large (67-100%) Tunneling: No Undermining: No Wound Description Classification: Grade 2 Foul Wound Margin: Flat and Intact Sloug Exudate Amount: None Present Odor After Cleansing: No h/Fibrino No Wound Bed Granulation Amount: None Present (0%) Exposed Structure Necrotic Amount: None Present (0%) Fascia Exposed: No Fat Layer (Subcutaneous Tissue) Exposed: Yes Tendon Exposed: No Muscle Exposed: No Joint Exposed: No Bone Exposed: No Periwound Skin Texture Texture  Color Bastin, Kashena A. ([id number]) No Abnormalities Noted: No No Abnormalities Noted: No Callus: Yes Atrophie Blanche: No Crepitus: No Cyanosis: No Excoriation: No Ecchymosis: No Induration: No Erythema: No Rash: No Hemosiderin Staining: No Scarring: No Mottled: No Pallor: No Moisture Rubor: No No Abnormalities Noted: No Dry / Scaly: No Temperature /  Pain Maceration: Yes Temperature: No Abnormality Wound Preparation Ulcer Cleansing: Rinsed/Irrigated with Saline Topical Anesthetic Applied: Other: lidocaine 4%, Electronic Signature(s) Signed: 06/28/2018 2:41:32 PM By: [name] Entered By: [name] on 06/28/2018 12:53:08 [name] ([id number]) -------------------------------------------------------------------------------- Vitals Details Patient Name: Heather Dear A. Date of Service: 06/28/2018 11:15 AM Medical Record Number: [id number] Patient Account Number: [id number] Date of Birth/Sex: 10/30/1975 (42 y.o. F) Treating RN: [name] Primary Care Azhane Eckart: [name] Other Clinician: Referring Branae Crail: [name] Treating Ammara Raj/Extender: [name] in Treatment: 23 Vital Signs Time Taken: 11:28 Temperature (F): 99.2 Height (in): 69 Pulse (bpm): 96 Weight (lbs): 250 Respiratory Rate (breaths/min): 16 Body Mass Index (BMI): 36.9 Blood Pressure (mmHg): 104/72 Reference Range: 80 - 120 mg / dl Electronic Signature(s) Signed: 06/28/2018 4:37:30 PM By: [name] RCP, RRT, CHT Entered By: [name] on 06/28/2018 11:31:49

## 2018-07-02 NOTE — Progress Notes (Signed)
BRYAN, OMURA ([id number]) Visit Report for 06/28/2018 Chief Complaint Document Details Patient Name: [name], [name] Date of Service: 06/28/2018 11:15 AM Medical Record Number: [id number] Patient Account Number: [id number] Date of Birth/Sex: [birth year] (42 y.o. F) Treating RN: Roger Shelter Primary Care Provider: Priscille Kluver Other Clinician: Referring Provider: Priscille Kluver Treating Provider/Extender: Cathie Olden in Treatment: 23 Information Obtained from: Patient Chief Complaint She is here for evaluation of a left great toe ulcer Electronic Signature(s) Signed: 06/28/2018 11:59:41 AM By: Lawanda Cousins Entered By: Lawanda Cousins on 06/28/2018 11:59:40 [name], [name] ([id number]) -------------------------------------------------------------------------------- HPI Details Patient Name: Heather Dear A. Date of Service: 06/28/2018 11:15 AM Medical Record Number: [id number] Patient Account Number: [id number] Date of Birth/Sex: 1976-02-20 (42 y.o. F) Treating RN: Roger Shelter Primary Care Provider: Priscille Kluver Other Clinician: Referring Provider: Priscille Kluver Treating Provider/Extender: Cathie Olden in Treatment: 23 History of Present Illness HPI Description: 01/18/18-She is here for initial evaluation of the left great toe ulcer. She is a poor historian in regards to timeframe in detail. She states approximately 4 weeks ago she lacerated her toe on something in the house. She followed up with her primary care who placed her on Bactrim and ultimately a second dose of Bactrim prior to coming to wound clinic. She states she has been treating the toe with peroxide, Betadine and a Band-Aid. She did not check her blood sugar this morning but checked it yesterday morning it was 327; she is unaware of a recent A1c and there are no current records. She saw Dr. she would've orthopedics last week for an old injury to the left ankle, she states he did not see her toe, nor did  she bring it to his attention. She smokes approximately 1 pack cigarettes a day. Her social situation is concerning, she arrives this morning with her mother who appears extremely intoxicated/under the influence; her mother was asked to leave the room and be monitored by the patient's grandmother. The patient's aunt then accompanied the patient and the room throughout the rest of the appointment. We had a lengthy discussion regarding the deleterious effects of uncontrolled hyperglycemia and smoking as it relates to wound healing and overall health. She was strongly encouraged to decrease her smoking and get her diabetes under better control. She states she is currently on a diet and has cut down her [facility] consumption. The left toe is erythematous, macerated and slightly edematous with malodor present. The edema in her left foot is below her baseline, there is no erythema streaking. We will treat her with Santyl, doxycycline; we have ordered and xray, culture and provided a Peg assist surgical shoe and cultured the wound. 01/25/18-She is here in follow-up evaluation for a left great toe ulcer and presents with an abscess to her suprapubic area. She states her blood sugars remain elevated, feeling "sick" and if levels are below 250, but she is trying. She has made no attempt to decrease her smoking stating that we "can't take away her food in her cigarettes". She has been compliant with offloading using the PEG assist you. She is using Santyl daily. the culture obtained last week grew staph aureus and Enterococcus faecalis; continues on the doxycycline and Augmentin was added on Monday. The suprapubic area has erythema, no femoral variation, purple discoloration, minimal induration, was accessed with a cotton tip applicator with sanguinopurulent drainage, this was cultured, I suspect the current antibiotic treatment will cover and we will not add anything to her current treatment plan. She  was  advised to go to urgent care or ER with any change in redness, induration or fever. 02/01/18-She is here in follow-up evaluation for left great toe ulcers and a new abdominal abscess from last week. She was able to use packing until earlier this week, where she "forgot it was there". She states she was feeling ill with GI symptoms last week and was not taking her antibiotic. She states her glucose levels have been predominantly less than 200, with occasional levels between 200-250. She thinks this was contributing to her GI symptoms as they have resolved without intervention. There continues to be significant laceration to left toe, otherwise it clinically looks stable/improved. There is now less superficial opening to the lateral aspect of the great toe that was residual blister. We will transition to [facility] to all wounds, she will continue her Augmentin. If there is no change or deterioration next week for reculture. 02/08/18-She is here in follow-up evaluation for left great toe ulcer and abdominal ulcer. There is an improvement in both wounds. She has been wrapping her left toe with coban, not by our direction, which has created an area of discoloration to the medial aspect; she has been advised to NOT use coban secondary to her neuropathy. She states her glucose levels have been high over this last week ranging from 200-350, she continues to smoke. She admits to being less compliant with her offloading shoe. We will continue with same treatment plan and she will follow-up next week. 02/15/18-She is here in follow-up evaluation for left great toe ulcer and abdominal ulcer. The abdominal ulcer is epithelialized. The left great toe ulcer is improved and all injury from last week using the Coban wrap is resolved, the lateral ulcer is healed. She admits to noncompliance with wearing offloading shoe and admits to glucose levels being greater than 300 most of the week. She continues to smoke and  expresses no desire to quit. There is one area medially that probes deeper than it has historically, erythema to the toe and dorsal foot has consistently waxed and waned. There is no overt signs of cellulitis or infection but we will culture the wound for any occult infection given the new area of depth and erythema. We will hold off on sensitivities for initiation of antibiotic therapy. 02/22/18-She is here in follow up evaluation for left great toe ulcer. There is overall significant improvement in both wound appearance, erythema and edema with changes made last week. She was not initiated on antibiotic therapy. Culture obtained last week showed oxacillin sensitive staph aureus, sensitive to clindamycin. Clindamycin has been called into the pharmacy but she has been instructed to hold off on initiation secondary to overall clinical improvement and her history of antibiotic intolerance. She has been instructed to contact the clinic with any noted changes/deterioration and the wound, erythema, [name], [name] ([id number]) edema and/or pain. She will follow-up next week. She continues to smoke and her glucose levels remain elevated >250; she admits to compliance with offloading shoe 03/01/18 on evaluation today patient appears to be doing fairly well in regard to her left first toe ulcer. She has been tolerating the dressing changes with the [facility] Dressing without complication and overall this has definitely showed signs of improvement according to records as well is what the patient tells me today. I'm very pleased in that regard. She is having no pain today 03/08/18 She is here for follow up evaluation of a left great toe ulcer. She remains non-compliant with  glucose control and smoking cessation; glucose levels consistently >200. She states that she got new shoe inserts/peg assist. She admits to compliance with offloading. Since my last evaluation there is significant improvement. We will  switch to prisma at this time and she will follow up next week. She is noted to be tachycardic at this appointment, heart rate 120s; she has a history of heart rate 70-130 according to our records. She admits to extreme agitation r/t personal issues; she was advised to monitor her heartrate and contact her physician if it does not return to a more normal range (<100). She takes cardizem twice daily. 03/15/18-She is here in follow-up evaluation for left great toe ulcer. She remains noncompliant with glucose control and smoking cessation. She admits to compliance with wearing offloading shoe. The ulcer is improved/stable and we will continue with the same treatment plan and she will follow-up next week 03/22/18-She is here for evaluation for left great toe ulcer. There continues to be significant improvement despite recurrent hyperglycemia (over 500 yesterday) and she continues to smoke. She has been compliant with offloading and we will continue with same treatment plan and she will follow-up next week. 03/29/18-She is here for evaluation for left great toe ulcer. Despite continuing to smoke and uncontrolled diabetes she continues to improve. She is compliant with offloading shoe. We will continue with the same treatment plan and she will follow-up next week 04/05/18- She is here in follow up evaluation for a left great toe ulcer; she presents with small pustule to left fifth toe (resembles ant bite). She admits to compliance with wearing offloading shoe; continues to smoke or have uncontrolled blood glucose control. There is more callus than usual with evidence of bleeding; she denies known trauma. 04/12/18-She is here for evaluation of left great toe ulcer. Despite noncompliance with glycemic control and smoking she continues to make improvement. She continues to wear offloading shoe. The pustule, that was identified last week, to the left fifth toe is resolved. She will follow-up in 2  weeks 05/03/18-she is seen in follow-up evaluation for a left great toe ulcer. She is compliant with offloading, otherwise noncompliant with glycemic control and smoking. She has plateaued and there is minimal improvement noted. We will transition to [facility], replaced the insert to her surgical shoe and she will follow-up in one week 05/10/18- She is here in follow up evaluation for a left great toe ulcer. It appears stable despite measurement change. We will continue with same treatment plan and follow up next week. 05/24/18-She is seen in follow-up evaluation for a left great toe ulcer. She remains compliant with offloading, has made significant improvement in her diet, decreasing the amount of sugar/soda. She said her recent A1c was 10.9 which is lower than. She did see a diabetic nutritionist/educator yesterday. She continues to smoke. We will continue with the same treatment plan and she'll follow-up next week. 05/31/18- She is seen in follow-up evaluation for left great toe ulcer. She continues to remain compliant with offloading, continues to make improvement in her diet, increasing her water and decreasing the amount of sugar/soda. She does continue to smoke with no desire to quit. We will apply Prisma to the depth and Hydrofera Blue over. We have not received insurance authorization for oasis. She will follow up next week. 06/07/18-She is seen in follow-up evaluation for left great toe ulcer. It has stalled according to today's measurements although base appears stable. She says she saw a diabetic educator yesterday; her average blood  sugars are less than 300 which is an improvement for her. She continues to smoke and states "that's my next step" She continues with water over soda. We will order for xray, culture and reinstate ace wrap compression prior to placing apligraf for next week. She is voicing no complaints or concerns. Her dressing will change to iodoflex over the next week in  preparation for apligraf. 06/14/18-She is seen in follow-up evaluation for left great toe ulcer. Plain film x-ray performed last week was negative for osteomyelitis. Wound culture obtained last week grew strep B and OSSA; she is initiated on keflex and cefdinir today; there is erythema to the toe which could be from ace wrap compression, she has a history of wrapping too tight and has has been encouraged to maintain ace wraps that we place today. We will hold off on application of apligraf today, will apply next week after antibiotic therapy has been initiated. She admits today that she has resumed taking a shower with her foot/toe submerged in water, she has been reminded to keep foot/toe out of the bath water. She will be seen in follow up next week 06/21/18-she is seen in follow-up evaluation for left great toe ulcer. She is tolerating antibiotic therapy with no GI disturbance. The wound is stable. Apligraf was applied today. She has been decreasing her smoking, only had 4 cigarettes yesterday and 1 today. She continues being more compliant in diabetic diet. She will follow-up next week for evaluation of site, if stable will remove at 2 weeks. 06/28/18- She is here in follow up evalution. Apligraf was placed last week, she states the dressing fell off on Tuesday and she was dressing with hydrofera blue. She is healed and will be discharged from the clinic today. She has been instructed to [facility] [place] A. ([id number]) continue with smoking cessation, continue monitoring glucose levels, offloading for an additional 4 weeks and continue with hydrofera blue for additional two weeks for any possible microscopic opening. Electronic Signature(s) Signed: 06/28/2018 12:02:06 PM By: Lawanda Cousins Entered By: Lawanda Cousins on 06/28/2018 12:02:06 [name] ([id number]) -------------------------------------------------------------------------------- Physician Orders Details Patient Name: Heather Dear  A. Date of Service: 06/28/2018 11:15 AM Medical Record Number: [id number] Patient Account Number: [id number] Date of Birth/Sex: May 27, 1976 (42 y.o. F) Treating RN: Roger Shelter Primary Care Provider: Priscille Kluver Other Clinician: Referring Provider: Priscille Kluver Treating Provider/Extender: Cathie Olden in Treatment: 43 Verbal / Phone Orders: No Diagnosis Coding Wound Cleansing Wound #1 Left Toe Great o Clean wound with Normal Saline. Anesthetic (add to Medication List) Wound #1 Left Toe Great o Topical Lidocaine 4% cream applied to wound bed prior to debridement (In Clinic Only). Primary Wound Dressing Wound #1 Left Toe Great o Hydrafera Blue Ready Transfer Secondary Dressing Wound #1 Left Toe Great o Conform/Kerlix Dressing Change Frequency Wound #1 Left Toe Great o Change dressing every other day. Follow-up Appointments Wound #1 Left Toe Great o Nurse Visit as needed Off-Loading o Other: - wear off loading shoe for one month Discharge From [facility] Wound #1 Left Toe Great o Discharge from [place] - treatment completed Electronic Signature(s) Signed: 06/28/2018 2:41:32 PM By: Roger Shelter Signed: 06/28/2018 6:16:13 PM By: Lawanda Cousins Entered By: Roger Shelter on 06/28/2018 11:59:05 [name], [name] ([id number]) -------------------------------------------------------------------------------- Problem List Details Patient Name: Heather Dear A. Date of Service: 06/28/2018 11:15 AM Medical Record Number: [id number] Patient Account Number: [id number] Date of Birth/Sex: 1976-07-23 (42 y.o. 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 ([id number]) -------------------------------------------------------------------------------- Progress Note Details Patient Name: Heather Dear A. Date of Service: 06/28/2018 11:15 AM Medical Record Number: [id number] Patient Account Number: [id number] Date of Birth/Sex: May 25, 1976 (42 y.o. F) Treating RN: Roger Shelter Primary Care Provider: Priscille Kluver Other Clinician: Referring Provider: Priscille Kluver Treating Provider/Extender: Cathie Olden in Treatment: 23 Subjective Chief Complaint Information obtained from Patient She is here for evaluation of a left great toe ulcer History of Present Illness (HPI) 01/18/18-She is here for initial evaluation of the left great toe ulcer. She is a poor historian in regards to timeframe in detail. She states approximately 4 weeks ago she lacerated her toe on something in the house. She followed up with her primary care who placed her on Bactrim and ultimately a second dose of Bactrim prior to coming to wound clinic. She states she has been treating the toe with peroxide, Betadine and a Band-Aid. She did not check her blood sugar this morning but checked it yesterday morning it was 327; she is unaware of a recent A1c and there are no current records. She saw Dr. she would've orthopedics last week for an old injury to the left ankle, she states he did not see her toe, nor did she bring it to his attention. She smokes approximately 1 pack cigarettes a  day. Her social situation is concerning, she arrives this morning with her mother who appears extremely intoxicated/under the influence; her mother was asked to leave the room and be monitored by the patient's grandmother. The patient's aunt then accompanied the patient and the room throughout the rest of the appointment. We had a lengthy discussion regarding the deleterious effects of uncontrolled hyperglycemia and smoking as it relates to wound healing and overall health. She was strongly encouraged to decrease her smoking and get her diabetes under better control. She states she is currently on a diet and has cut down her [facility] consumption. The left toe is erythematous, macerated and slightly edematous with malodor present. The edema in her left foot is below her baseline, there is no erythema streaking. We will treat her with Santyl, doxycycline; we have ordered and xray, culture and provided a Peg assist surgical shoe and cultured the wound. 01/25/18-She is here in follow-up evaluation for a left great toe ulcer and presents with an abscess to her suprapubic area. She states her blood sugars remain elevated, feeling "sick" and if levels are below 250, but she is trying. She has made no attempt to decrease her smoking stating that we "can't take away her food in her cigarettes". She has been compliant with offloading using the PEG assist you. She is using Santyl daily. the culture obtained last week grew staph aureus and Enterococcus faecalis; continues on the doxycycline and Augmentin was added on Monday. The suprapubic area has erythema, no femoral variation, purple discoloration, minimal induration, was accessed with a cotton  tip applicator with sanguinopurulent drainage, this was cultured, I suspect the current antibiotic treatment will cover and we will not add anything to her current treatment plan. She was advised to go to urgent care or ER with any change in redness, induration or  fever. 02/01/18-She is here in follow-up evaluation for left great toe ulcers and a new abdominal abscess from last week. She was able to use packing until earlier this week, where she "forgot it was there". She states she was feeling ill with GI symptoms last week and was not taking her antibiotic. She states her glucose levels have been predominantly less than 200, with occasional levels between 200-250. She thinks this was contributing to her GI symptoms as they have resolved without intervention. There continues to be significant laceration to left toe, otherwise it clinically looks stable/improved. There is now less superficial opening to the lateral aspect of the great toe that was residual blister. We will transition to [facility] to all wounds, she will continue her Augmentin. If there is no change or deterioration next week for reculture. 02/08/18-She is here in follow-up evaluation for left great toe ulcer and abdominal ulcer. There is an improvement in both wounds. She has been wrapping her left toe with coban, not by our direction, which has created an area of discoloration to the medial aspect; she has been advised to NOT use coban secondary to her neuropathy. She states her glucose levels have been high over this last week ranging from 200-350, she continues to smoke. She admits to being less compliant with her offloading shoe. We will continue with same treatment plan and she will follow-up next week. 02/15/18-She is here in follow-up evaluation for left great toe ulcer and abdominal ulcer. The abdominal ulcer is epithelialized. The left great toe ulcer is improved and all injury from last week using the Coban wrap is resolved, the lateral ulcer is healed. She admits to noncompliance with wearing offloading shoe and admits to glucose levels being greater than 300 most of the week. She continues to smoke and expresses no desire to quit. There is one area medially that probes deeper  than it has historically, erythema to the toe and dorsal foot has consistently waxed and waned. There is no overt signs of cellulitis or infection but we will culture the wound for any occult infection given the new area of depth and erythema. We will hold off on [name], [name] ([id number]) sensitivities for initiation of antibiotic therapy. 02/22/18-She is here in follow up evaluation for left great toe ulcer. There is overall significant improvement in both wound appearance, erythema and edema with changes made last week. She was not initiated on antibiotic therapy. Culture obtained last week showed oxacillin sensitive staph aureus, sensitive to clindamycin. Clindamycin has been called into the pharmacy but she has been instructed to hold off on initiation secondary to overall clinical improvement and her history of antibiotic intolerance. She has been instructed to contact the clinic with any noted changes/deterioration and the wound, erythema, edema and/or pain. She will follow-up next week. She continues to smoke and her glucose levels remain elevated >250; she admits to compliance with offloading shoe 03/01/18 on evaluation today patient appears to be doing fairly well in regard to her left first toe ulcer. She has been tolerating the dressing changes with the [facility] Dressing without complication and overall this has definitely showed signs of improvement according to records as well is what the patient tells me today. I'm very  pleased in that regard. She is having no pain today 03/08/18 She is here for follow up evaluation of a left great toe ulcer. She remains non-compliant with glucose control and smoking cessation; glucose levels consistently >200. She states that she got new shoe inserts/peg assist. She admits to compliance with offloading. Since my last evaluation there is significant improvement. We will switch to prisma at this time and she will follow up next week. She is noted  to be tachycardic at this appointment, heart rate 120s; she has a history of heart rate 70-130 according to our records. She admits to extreme agitation r/t personal issues; she was advised to monitor her heartrate and contact her physician if it does not return to a more normal range (<100). She takes cardizem twice daily. 03/15/18-She is here in follow-up evaluation for left great toe ulcer. She remains noncompliant with glucose control and smoking cessation. She admits to compliance with wearing offloading shoe. The ulcer is improved/stable and we will continue with the same treatment plan and she will follow-up next week 03/22/18-She is here for evaluation for left great toe ulcer. There continues to be significant improvement despite recurrent hyperglycemia (over 500 yesterday) and she continues to smoke. She has been compliant with offloading and we will continue with same treatment plan and she will follow-up next week. 03/29/18-She is here for evaluation for left great toe ulcer. Despite continuing to smoke and uncontrolled diabetes she continues to improve. She is compliant with offloading shoe. We will continue with the same treatment plan and she will follow-up next week 04/05/18- She is here in follow up evaluation for a left great toe ulcer; she presents with small pustule to left fifth toe (resembles ant bite). She admits to compliance with wearing offloading shoe; continues to smoke or have uncontrolled blood glucose control. There is more callus than usual with evidence of bleeding; she denies known trauma. 04/12/18-She is here for evaluation of left great toe ulcer. Despite noncompliance with glycemic control and smoking she continues to make improvement. She continues to wear offloading shoe. The pustule, that was identified last week, to the left fifth toe is resolved. She will follow-up in 2 weeks 05/03/18-she is seen in follow-up evaluation for a left great toe ulcer. She is compliant  with offloading, otherwise noncompliant with glycemic control and smoking. She has plateaued and there is minimal improvement noted. We will transition to [employer], replaced the insert to her surgical shoe and she will follow-up in one week 05/10/18- She is here in follow up evaluation for a left great toe ulcer. It appears stable despite measurement change. We will continue with same treatment plan and follow up next week. 05/24/18-She is seen in follow-up evaluation for a left great toe ulcer. She remains compliant with offloading, has made significant improvement in her diet, decreasing the amount of sugar/soda. She said her recent A1c was 10.9 which is lower than. She did see a diabetic nutritionist/educator yesterday. She continues to smoke. We will continue with the same treatment plan and she'll follow-up next week. 05/31/18- She is seen in follow-up evaluation for left great toe ulcer. She continues to remain compliant with offloading, continues to make improvement in her diet, increasing her water and decreasing the amount of sugar/soda. She does continue to smoke with no desire to quit. We will apply Prisma to the depth and Hydrofera Blue over. We have not received insurance authorization for oasis. She will follow up next week. 06/07/18-She is seen in follow-up  evaluation for left great toe ulcer. It has stalled according to today's measurements although base appears stable. She says she saw a diabetic educator yesterday; her average blood sugars are less than 300 which is an improvement for her. She continues to smoke and states "that's my next step" She continues with water over soda. We will order for xray, culture and reinstate ace wrap compression prior to placing apligraf for next week. She is voicing no complaints or concerns. Her dressing will change to iodoflex over the next week in preparation for apligraf. 06/14/18-She is seen in follow-up evaluation for left great toe ulcer.  Plain film x-ray performed last week was negative for osteomyelitis. Wound culture obtained last week grew strep B and OSSA; she is initiated on keflex and cefdinir today; there is erythema to the toe which could be from ace wrap compression, she has a history of wrapping too tight and has has been encouraged to maintain ace wraps that we place today. We will hold off on application of apligraf today, will apply next week after antibiotic therapy has been initiated. She admits today that she has resumed taking a shower with her foot/toe submerged in water, she has been reminded to keep foot/toe out of the bath water. She will be seen in follow up next week 06/21/18-she is seen in follow-up evaluation for left great toe ulcer. She is tolerating antibiotic therapy with no GI disturbance. The wound is stable. Apligraf was applied today. She has been decreasing her smoking, only had 4 cigarettes yesterday and [name], [name] ([id number]) 1 today. She continues being more compliant in diabetic diet. She will follow-up next week for evaluation of site, if stable will remove at 2 weeks. 06/28/18- She is here in follow up evalution. Apligraf was placed last week, she states the dressing fell off on Tuesday and she was dressing with hydrofera blue. She is healed and will be discharged from the clinic today. She has been instructed to continue with smoking cessation, continue monitoring glucose levels, offloading for an additional 4 weeks and continue with hydrofera blue for additional two weeks for any possible microscopic opening. Objective Constitutional Vitals Time Taken: 11:28 AM, Height: 69 in, Weight: 250 lbs, BMI: 36.9, Temperature: 99.2 F, Pulse: 96 bpm, Respiratory Rate: 16 breaths/min, Blood Pressure: 104/72 mmHg. Integumentary (Hair, Skin) Wound #1 status is Healed - Epithelialized. Original cause of wound was Gradually Appeared. The wound is located on the Left Toe Great. The wound measures  0cm length x 0cm width x 0cm depth; 0cm^2 area and 0cm^3 volume. There is Fat Layer (Subcutaneous Tissue) Exposed exposed. There is no tunneling or undermining noted. There is a none present amount of drainage noted. The wound margin is flat and intact. There is no granulation within the wound bed. There is no necrotic tissue within the wound bed. The periwound skin appearance exhibited: Callus, Maceration. The periwound skin appearance did not exhibit: Crepitus, Excoriation, Induration, Rash, Scarring, Dry/Scaly, Atrophie Blanche, Cyanosis, Ecchymosis, Hemosiderin Staining, Mottled, Pallor, Rubor, Erythema. Periwound temperature was noted as No Abnormality. Assessment Active Problems ICD-10 Type 2 diabetes mellitus with foot ulcer Non-pressure chronic ulcer of other part of left foot limited to breakdown of skin Plan Wound Cleansing: Wound #1 Left Toe Great: Clean wound with Normal Saline. Anesthetic (add to Medication List): Wound #1 Left Toe Great: Topical Lidocaine 4% cream applied to wound bed prior to debridement (In Clinic Only). Primary Wound Dressing: Wound #1 Left Toe Great: Hydrafera Blue Ready Transfer [name], [name] (  [id number]) Secondary Dressing: Wound #1 Left Toe Great: Conform/Kerlix Dressing Change Frequency: Wound #1 Left Toe Great: Change dressing every other day. Follow-up Appointments: Wound #1 Left Toe Great: Nurse Visit as needed Off-Loading: Other: - wear off loading shoe for one month Discharge From [facility]: Wound #1 Left Toe Great: Discharge from [facility] - treatment completed Electronic Signature(s) Signed: 06/28/2018 6:19:01 PM By: Lawanda Cousins Previous Signature: 06/28/2018 12:02:19 PM Version By: Lawanda Cousins Entered By: Lawanda Cousins on 06/28/2018 18:19:00 [name], Talma A. ([id number]) -------------------------------------------------------------------------------- SuperBill Details Patient Name: Heather Dear A. Date of  Service: 06/28/2018 Medical Record Number: [id number] Patient Account Number: [id number] Date of Birth/Sex: [birth year] (43 y.o. F) Treating RN: Roger Shelter Primary Care Provider: Priscille Kluver Other Clinician: Referring Provider: Priscille Kluver Treating Provider/Extender: Cathie Olden in Treatment: 23 Diagnosis Coding ICD-10 Codes Code Description E11.621 Type 2 diabetes mellitus with foot ulcer L97.521 Non-pressure chronic ulcer of other part of left foot limited to breakdown of skin Facility Procedures CPT4 Code: [id number] Description: 82505 - WOUND CARE VISIT-LEV 2 EST PT Modifier: Quantity: 1 Physician Procedures CPT4 Code Description: [id number] - WC PHYS LEVEL 2 - EST PT ICD-10 Diagnosis Description E11.621 Type 2 diabetes mellitus with foot ulcer L97.521 Non-pressure chronic ulcer of other part of left foot limited t Modifier: o breakdown of sk Quantity: 1 in Electronic Signature(s) Signed: 06/28/2018 12:02:33 PM By: Lawanda Cousins Entered By: Lawanda Cousins on 06/28/2018 12:02:32

## 2018-07-05 ENCOUNTER — Encounter: Payer: Medicaid Other | Admitting: Nurse Practitioner

## 2018-07-12 ENCOUNTER — Encounter: Payer: Medicaid Other | Admitting: Nurse Practitioner

## 2018-08-07 ENCOUNTER — Encounter: Payer: Medicaid Other | Attending: Physician Assistant | Admitting: Physician Assistant

## 2018-08-07 DIAGNOSIS — I499 Cardiac arrhythmia, unspecified: Secondary | ICD-10-CM | POA: Diagnosis not present

## 2018-08-07 DIAGNOSIS — L03032 Cellulitis of left toe: Secondary | ICD-10-CM | POA: Diagnosis not present

## 2018-08-07 DIAGNOSIS — Z809 Family history of malignant neoplasm, unspecified: Secondary | ICD-10-CM | POA: Insufficient documentation

## 2018-08-07 DIAGNOSIS — L03031 Cellulitis of right toe: Secondary | ICD-10-CM | POA: Insufficient documentation

## 2018-08-07 DIAGNOSIS — L97522 Non-pressure chronic ulcer of other part of left foot with fat layer exposed: Secondary | ICD-10-CM | POA: Insufficient documentation

## 2018-08-07 DIAGNOSIS — F17210 Nicotine dependence, cigarettes, uncomplicated: Secondary | ICD-10-CM | POA: Diagnosis not present

## 2018-08-07 DIAGNOSIS — G40909 Epilepsy, unspecified, not intractable, without status epilepticus: Secondary | ICD-10-CM | POA: Insufficient documentation

## 2018-08-07 DIAGNOSIS — Z8249 Family history of ischemic heart disease and other diseases of the circulatory system: Secondary | ICD-10-CM | POA: Diagnosis not present

## 2018-08-07 DIAGNOSIS — E11622 Type 2 diabetes mellitus with other skin ulcer: Secondary | ICD-10-CM | POA: Insufficient documentation

## 2018-08-07 DIAGNOSIS — J449 Chronic obstructive pulmonary disease, unspecified: Secondary | ICD-10-CM | POA: Insufficient documentation

## 2018-08-07 DIAGNOSIS — Z881 Allergy status to other antibiotic agents status: Secondary | ICD-10-CM | POA: Insufficient documentation

## 2018-08-07 DIAGNOSIS — L97512 Non-pressure chronic ulcer of other part of right foot with fat layer exposed: Secondary | ICD-10-CM | POA: Diagnosis present

## 2018-08-07 DIAGNOSIS — E1140 Type 2 diabetes mellitus with diabetic neuropathy, unspecified: Secondary | ICD-10-CM | POA: Diagnosis not present

## 2018-08-07 DIAGNOSIS — F419 Anxiety disorder, unspecified: Secondary | ICD-10-CM | POA: Insufficient documentation

## 2018-08-10 NOTE — Progress Notes (Addendum)
MARIETTA, SIKKEMA ([id number]) Visit Report for 08/07/2018 Chief Complaint Document Details Patient Name: [name], [name] Date of Service: 08/07/2018 8:45 AM Medical Record Number: [id number] Patient Account Number: [id number] Date of Birth/Sex: 1976-01-11 (42 y.o. F) Treating RN: Montey Hora Primary Care Provider: Priscille Kluver Other Clinician: Referring Provider: Priscille Kluver Treating Provider/Extender: Melburn Hake, HOYT Weeks in Treatment: 0 Information Obtained from: Patient Chief Complaint She is here for evaluation of a left great toe ulcer Electronic Signature(s) Signed: 08/09/2018 5:08:41 PM By: Worthy Keeler PA-C Entered By: Worthy Keeler on 08/09/2018 10:18:06 [name] ([id number]) -------------------------------------------------------------------------------- Debridement Details Patient Name: Heather Dear A. Date of Service: 08/07/2018 8:45 AM Medical Record Number: [id number] Patient Account Number: [id number] Date of Birth/Sex: [birth year] (42 y.o. F) Treating RN: Montey Hora Primary Care Provider: Priscille Kluver Other Clinician: Referring Provider: Priscille Kluver Treating Provider/Extender: STONE III, HOYT Weeks in Treatment: 0 Debridement Performed for Wound #4 Left Toe Great Assessment: Performed By: Physician STONE III, HOYT E., PA-C Debridement Type: Debridement Severity of Tissue Pre Fat layer exposed Debridement: Level of Consciousness (Pre- Awake and Alert procedure): Pre-procedure Verification/Time Yes - 09:18 Out Taken: Start Time: 09:18 Pain Control: Lidocaine 4% Topical Solution Total Area Debrided (L x W): 2.2 (cm) x 0.9 (cm) = 1.98 (cm) Tissue and other material Viable, Non-Viable, Callus, Slough, Subcutaneous, Slough debrided: Level: Skin/Subcutaneous Tissue Debridement Description: Excisional Instrument: Curette Bleeding: Minimum Hemostasis Achieved: Pressure End Time: 09:23 Procedural Pain: 0 Post Procedural Pain:  0 Response to Treatment: Procedure was tolerated well Level of Consciousness Awake and Alert (Post-procedure): Post Debridement Measurements of Total Wound Length: (cm) 0.8 Width: (cm) 0.5 Depth: (cm) 0.2 Volume: (cm) 0.063 Character of Wound/Ulcer Post Debridement: Improved Severity of Tissue Post Debridement: Fat layer exposed Post Procedure Diagnosis Same as Pre-procedure Electronic Signature(s) Signed: 08/07/2018 5:26:03 PM By: Montey Hora Signed: 08/08/2018 11:13:36 AM By: Worthy Keeler PA-C Entered By: Montey Hora on 08/07/2018 09:24:59 Zubiate, Pebbles A. ([id number]) -------------------------------------------------------------------------------- HPI Details Patient Name: Heather Dear A. Date of Service: 08/07/2018 8:45 AM Medical Record Number: [id number] Patient Account Number: [id number] Date of Birth/Sex: 20-Oct-1976 (42 y.o. F) Treating RN: Montey Hora Primary Care Provider: Priscille Kluver Other Clinician: Referring Provider: Priscille Kluver Treating Provider/Extender: Melburn Hake, HOYT Weeks in Treatment: 0 History of Present Illness HPI Description: 01/18/18-She is here for initial evaluation of the left great toe ulcer. She is a poor historian in regards to timeframe in detail. She states approximately 4 weeks ago she lacerated her toe on something in the house. She followed up with her primary care who placed her on Bactrim and ultimately a second dose of Bactrim prior to coming to wound clinic. She states she has been treating the toe with peroxide, Betadine and a Band-Aid. She did not check her blood sugar this morning but checked it yesterday morning it was 327; she is unaware of a recent A1c and there are no current records. She saw Dr. she would've orthopedics last week for an old injury to the left ankle, she states he did not see her toe, nor did she bring it to his attention. She smokes approximately 1 pack cigarettes a day. Her social situation is  concerning, she arrives this morning with her mother who appears extremely intoxicated/under the influence; her mother was asked to leave the room and be monitored by the patient's grandmother. The patient's aunt then accompanied the patient and the room throughout the rest of the appointment. We had a lengthy discussion regarding the  deleterious effects of uncontrolled hyperglycemia and smoking as it relates to wound healing and overall health. She was strongly encouraged to decrease her smoking and get her diabetes under better control. She states she is currently on a diet and has cut down her [facility] consumption. The left toe is erythematous, macerated and slightly edematous with malodor present. The edema in her left foot is below her baseline, there is no erythema streaking. We will treat her with Santyl, doxycycline; we have ordered and xray, culture and provided a Peg assist surgical shoe and cultured the wound. 01/25/18-She is here in follow-up evaluation for a left great toe ulcer and presents with an abscess to her suprapubic area. She states her blood sugars remain elevated, feeling "sick" and if levels are below 250, but she is trying. She has made no attempt to decrease her smoking stating that we "can't take away her food in her cigarettes". She has been compliant with offloading using the PEG assist you. She is using Santyl daily. the culture obtained last week grew staph aureus and Enterococcus faecalis; continues on the doxycycline and Augmentin was added on Monday. The suprapubic area has erythema, no femoral variation, purple discoloration, minimal induration, was accessed with a cotton tip applicator with sanguinopurulent drainage, this was cultured, I suspect the current antibiotic treatment will cover and we will not add anything to her current treatment plan. She was advised to go to urgent care or ER with any change in redness, induration or fever. 02/01/18-She is here in  follow-up evaluation for left great toe ulcers and a new abdominal abscess from last week. She was able to use packing until earlier this week, where she "forgot it was there". She states she was feeling ill with GI symptoms last week and was not taking her antibiotic. She states her glucose levels have been predominantly less than 200, with occasional levels between 200-250. She thinks this was contributing to her GI symptoms as they have resolved without intervention. There continues to be significant laceration to left toe, otherwise it clinically looks stable/improved. There is now less superficial opening to the lateral aspect of the great toe that was residual blister. We will transition to [facility] to all wounds, she will continue her Augmentin. If there is no change or deterioration next week for reculture. 02/08/18-She is here in follow-up evaluation for left great toe ulcer and abdominal ulcer. There is an improvement in both wounds. She has been wrapping her left toe with coban, not by our direction, which has created an area of discoloration to the medial aspect; she has been advised to NOT use coban secondary to her neuropathy. She states her glucose levels have been high over this last week ranging from 200-350, she continues to smoke. She admits to being less compliant with her offloading shoe. We will continue with same treatment plan and she will follow-up next week. 02/15/18-She is here in follow-up evaluation for left great toe ulcer and abdominal ulcer. The abdominal ulcer is epithelialized. The left great toe ulcer is improved and all injury from last week using the Coban wrap is resolved, the lateral ulcer is healed. She admits to noncompliance with wearing offloading shoe and admits to glucose levels being greater than 300 most of the week. She continues to smoke and expresses no desire to quit. There is one area medially that probes deeper than it has historically,  erythema to the toe and dorsal foot has consistently waxed and waned. There is no overt  signs of cellulitis or infection but we will culture the wound for any occult infection given the new area of depth and erythema. We will hold off on sensitivities for initiation of antibiotic therapy. 02/22/18-She is here in follow up evaluation for left great toe ulcer. There is overall significant improvement in both wound appearance, erythema and edema with changes made last week. She was not initiated on antibiotic therapy. Culture obtained last week showed oxacillin sensitive staph aureus, sensitive to clindamycin. Clindamycin has been called into the pharmacy but she has been instructed to hold off on initiation secondary to overall clinical improvement and her history of antibiotic intolerance. She has been instructed to contact the clinic with any noted changes/deterioration and the wound, erythema, Shrum, Primrose A. ([id number]) edema and/or pain. She will follow-up next week. She continues to smoke and her glucose levels remain elevated >250; she admits to compliance with offloading shoe 03/01/18 on evaluation today patient appears to be doing fairly well in regard to her left first toe ulcer. She has been tolerating the dressing changes with the [facility] Dressing without complication and overall this has definitely showed signs of improvement according to records as well is what the patient tells me today. I'm very pleased in that regard. She is having no pain today 03/08/18 She is here for follow up evaluation of a left great toe ulcer. She remains non-compliant with glucose control and smoking cessation; glucose levels consistently >200. She states that she got new shoe inserts/peg assist. She admits to compliance with offloading. Since my last evaluation there is significant improvement. We will switch to prisma at this time and she will follow up next week. She is noted to be tachycardic at this  appointment, heart rate 120s; she has a history of heart rate 70-130 according to our records. She admits to extreme agitation r/t personal issues; she was advised to monitor her heartrate and contact her physician if it does not return to a more normal range (<100). She takes cardizem twice daily. 03/15/18-She is here in follow-up evaluation for left great toe ulcer. She remains noncompliant with glucose control and smoking cessation. She admits to compliance with wearing offloading shoe. The ulcer is improved/stable and we will continue with the same treatment plan and she will follow-up next week 03/22/18-She is here for evaluation for left great toe ulcer. There continues to be significant improvement despite recurrent hyperglycemia (over 500 yesterday) and she continues to smoke. She has been compliant with offloading and we will continue with same treatment plan and she will follow-up next week. 03/29/18-She is here for evaluation for left great toe ulcer. Despite continuing to smoke and uncontrolled diabetes she continues to improve. She is compliant with offloading shoe. We will continue with the same treatment plan and she will follow-up next week 04/05/18- She is here in follow up evaluation for a left great toe ulcer; she presents with small pustule to left fifth toe (resembles ant bite). She admits to compliance with wearing offloading shoe; continues to smoke or have uncontrolled blood glucose control. There is more callus than usual with evidence of bleeding; she denies known trauma. 04/12/18-She is here for evaluation of left great toe ulcer. Despite noncompliance with glycemic control and smoking she continues to make improvement. She continues to wear offloading shoe. The pustule, that was identified last week, to the left fifth toe is resolved. She will follow-up in 2 weeks 05/03/18-she is seen in follow-up evaluation for a left great toe ulcer. She  is compliant with offloading,  otherwise noncompliant with glycemic control and smoking. She has plateaued and there is minimal improvement noted. We will transition to [facility] replaced the insert to her surgical shoe and she will follow-up in one week 05/10/18- She is here in follow up evaluation for a left great toe ulcer. It appears stable despite measurement change. We will continue with same treatment plan and follow up next week. 05/24/18-She is seen in follow-up evaluation for a left great toe ulcer. She remains compliant with offloading, has made significant improvement in her diet, decreasing the amount of sugar/soda. She said her recent A1c was 10.9 which is lower than. She did see a diabetic nutritionist/educator yesterday. She continues to smoke. We will continue with the same treatment plan and she'll follow-up next week. 05/31/18- She is seen in follow-up evaluation for left great toe ulcer. She continues to remain compliant with offloading, continues to make improvement in her diet, increasing her water and decreasing the amount of sugar/soda. She does continue to smoke with no desire to quit. We will apply Prisma to the depth and Hydrofera Blue over. We have not received insurance authorization for oasis. She will follow up next week. 06/07/18-She is seen in follow-up evaluation for left great toe ulcer. It has stalled according to today's measurements although base appears stable. She says she saw a diabetic educator yesterday; her average blood sugars are less than 300 which is an improvement for her. She continues to smoke and states "that's my next step" She continues with water over soda. We will order for xray, culture and reinstate ace wrap compression prior to placing apligraf for next week. She is voicing no complaints or concerns. Her dressing will change to iodoflex over the next week in preparation for apligraf. 06/14/18-She is seen in follow-up evaluation for left great toe ulcer. Plain film x-ray  performed last week was negative for osteomyelitis. Wound culture obtained last week grew strep B and OSSA; she is initiated on keflex and cefdinir today; there is erythema to the toe which could be from ace wrap compression, she has a history of wrapping too tight and has has been encouraged to maintain ace wraps that we place today. We will hold off on application of apligraf today, will apply next week after antibiotic therapy has been initiated. She admits today that she has resumed taking a shower with her foot/toe submerged in water, she has been reminded to keep foot/toe out of the bath water. She will be seen in follow up next week 06/21/18-she is seen in follow-up evaluation for left great toe ulcer. She is tolerating antibiotic therapy with no GI disturbance. The wound is stable. Apligraf was applied today. She has been decreasing her smoking, only had 4 cigarettes yesterday and 1 today. She continues being more compliant in diabetic diet. She will follow-up next week for evaluation of site, if stable will remove at 2 weeks. 06/28/18- She is here in follow up evalution. Apligraf was placed last week, she states the dressing fell off on Tuesday and she was dressing with hydrofera blue. She is healed and will be discharged from the clinic today. She has been instructed to continue with smoking cessation, continue monitoring glucose levels, offloading for an additional 4 weeks and continue with Arterberry, Bahar A. ([id number]) hydrofera blue for additional two weeks for any possible microscopic opening. Readmission: 08/07/18 on evaluation today patient presents for reevaluation concerning the ulcer of her right great toe. She was previously discharged on  06/28/18 healed. Nonetheless she states that this began to show signs of drainage she subsequently went to her primary care provider. Subsequently an x-ray was performed on 08/01/18 which was negative. The patient was also placed on antibiotics at  that time. Fortunately they should have been effective for the infection. Nonetheless she's been experiencing some improvement but still has a lot of drainage coming from the wound itself. Electronic Signature(s) Signed: 08/09/2018 5:08:41 PM By: Worthy Keeler PA-C Entered By: Worthy Keeler on 08/09/2018 10:22:16 [name] ([id number]) -------------------------------------------------------------------------------- Physical Exam Details Patient Name: Heather Dear A. Date of Service: 08/07/2018 8:45 AM Medical Record Number: [id number] Patient Account Number: [id number] Date of Birth/Sex: Feb 08, 1976 (42 y.o. F) Treating RN: Montey Hora Primary Care Provider: Priscille Kluver Other Clinician: Referring Provider: Priscille Kluver Treating Provider/Extender: STONE III, HOYT Weeks in Treatment: 0 Constitutional Well-nourished and well-hydrated in no acute distress. Respiratory normal breathing without difficulty. clear to auscultation bilaterally. Cardiovascular regular rate and rhythm with normal S1, S2. Psychiatric this patient is able to make decisions and demonstrates good insight into disease process. Alert and Oriented x 3. pleasant and cooperative. Notes It would appear based upon inspection today with the callous overlying the wound bed that the patient may actually a patty wound opening that never truly and completely closed. Subsequently and then begin to collect fluid, became infected, and because the issues that we're seeing at this point. Nonetheless debridement was performed today to remove some the callous from the surface of the wound as will [facility] from the surface of the wing. Post debridement the wound bed appears to be better but again this is something that we need to ensure doesn't just recover over leaving again in opening underlying. Electronic Signature(s) Signed: 08/09/2018 5:08:41 PM By: Worthy Keeler PA-C Entered By: Worthy Keeler on 08/09/2018  10:23:07 [name] ([id number]) -------------------------------------------------------------------------------- Physician Orders Details Patient Name: Heather Dear A. Date of Service: 08/07/2018 8:45 AM Medical Record Number: [id number] Patient Account Number: [id number] Date of Birth/Sex: 1975/12/09 (42 y.o. F) Treating RN: Montey Hora Primary Care Provider: Priscille Kluver Other Clinician: Referring Provider: Priscille Kluver Treating Provider/Extender: Melburn Hake, HOYT Weeks in Treatment: 0 Verbal / Phone Orders: No Diagnosis Coding ICD-10 Coding Code Description E11.621 Type 2 diabetes mellitus with foot ulcer L03.031 Cellulitis of right toe Wound Cleansing Wound #4 Left Toe Great o Clean wound with Normal Saline. Anesthetic (add to Medication List) Wound #4 Left Toe Great o Topical Lidocaine 4% cream applied to wound bed prior to debridement (In Clinic Only). Primary Wound Dressing Wound #4 Left Toe Great o Silver Collagen Secondary Dressing Wound #4 Left Toe Great o Conform/Kerlix o Drawtex Dressing Change Frequency Wound #4 Left Toe Great o Change dressing every other day. Follow-up Appointments Wound #4 Left Toe Great o Return Appointment in 1 week. Electronic Signature(s) Signed: 08/07/2018 5:26:03 PM By: Montey Hora Signed: 08/08/2018 11:13:36 AM By: Worthy Keeler PA-C Entered By: Montey Hora on 08/07/2018 09:28:08 [name] ([id number]) -------------------------------------------------------------------------------- Problem List Details Patient Name: Heather Dear A. Date of Service: 08/07/2018 8:45 AM Medical Record Number: [id number] Patient Account Number: [id number] Date of Birth/Sex: 1975-12-05 (42 y.o. 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 ([id number]) -------------------------------------------------------------------------------- Progress Note Details Patient Name: Heather Dear A. Date of Service: 08/07/2018 8:45 AM Medical Record Number: [id number] Patient Account Number: [id number] Date of Birth/Sex: Dec 13, 1975 (42 y.o. F) Treating RN: Montey Hora Primary Care Provider: Priscille Kluver Other Clinician: Referring Provider: Priscille Kluver Treating Provider/Extender: Melburn Hake, HOYT Weeks in Treatment: 0 Subjective Chief Complaint Information obtained from Patient She is here for evaluation of a left great toe ulcer History of Present Illness (HPI) 01/18/18-She is here for initial evaluation of the left great toe ulcer. She is a poor historian in regards to timeframe in detail. She states approximately 4 weeks ago she lacerated her toe on something in the house. She followed up with her primary care who placed her on Bactrim and ultimately a second dose of Bactrim prior to coming to wound clinic. She states she has been treating the toe with peroxide, Betadine and a Band-Aid. She did not check her blood sugar this morning but checked it yesterday morning it was 327; she is unaware of a recent A1c and there are no current records. She saw Dr. she would've orthopedics last week for an old injury to the left ankle, she states he did not see her toe, nor did she bring it to his attention. She smokes  approximately 1 pack cigarettes a day. Her social situation is concerning, she arrives this morning with her mother who appears extremely intoxicated/under the influence; her mother was asked to leave the room and be monitored by the patient's grandmother. The patient's aunt then accompanied the patient and the room throughout the rest of the appointment. We had a lengthy discussion regarding the deleterious effects of uncontrolled hyperglycemia and smoking as it relates to wound healing and overall health. She was strongly encouraged to decrease her smoking and get her diabetes under better control. She states she is currently on a diet and has cut down her [facility] consumption. The left toe is erythematous, macerated and slightly edematous with malodor present. The edema in her left foot is below her baseline, there is no erythema streaking. We will treat her with Santyl, doxycycline; we have ordered and xray, culture and provided a Peg assist surgical shoe and cultured the wound. 01/25/18-She is here in follow-up evaluation for a left great toe ulcer and presents with an abscess to her suprapubic area. She states her blood sugars remain elevated, feeling "sick" and if levels are below 250, but she is trying. She has made no attempt to decrease her smoking stating that we "can't take away her food in her cigarettes". She has been compliant with offloading using the PEG assist you. She is using Santyl daily. the culture obtained last week grew staph aureus and Enterococcus faecalis; continues on the doxycycline and Augmentin was added on Monday. The suprapubic area has erythema, no femoral variation, purple discoloration, minimal induration, was accessed with a cotton tip applicator with sanguinopurulent drainage, this was cultured, I suspect the current antibiotic treatment will cover and we will not add anything to her current treatment plan. She was advised to go to urgent care or ER with any  change in redness, induration or fever. 02/01/18-She is here in follow-up evaluation  for left great toe ulcers and a new abdominal abscess from last week. She was able to use packing until earlier this week, where she "forgot it was there". She states she was feeling ill with GI symptoms last week and was not taking her antibiotic. She states her glucose levels have been predominantly less than 200, with occasional levels between 200-250. She thinks this was contributing to her GI symptoms as they have resolved without intervention. There continues to be significant laceration to left toe, otherwise it clinically looks stable/improved. There is now less superficial opening to the lateral aspect of the great toe that was residual blister. We will transition to [facility] to all wounds, she will continue her Augmentin. If there is no change or deterioration next week for reculture. 02/08/18-She is here in follow-up evaluation for left great toe ulcer and abdominal ulcer. There is an improvement in both wounds. She has been wrapping her left toe with coban, not by our direction, which has created an area of discoloration to the medial aspect; she has been advised to NOT use coban secondary to her neuropathy. She states her glucose levels have been high over this last week ranging from 200-350, she continues to smoke. She admits to being less compliant with her offloading shoe. We will continue with same treatment plan and she will follow-up next week. 02/15/18-She is here in follow-up evaluation for left great toe ulcer and abdominal ulcer. The abdominal ulcer is epithelialized. The left great toe ulcer is improved and all injury from last week using the Coban wrap is resolved, the lateral ulcer is healed. She admits to noncompliance with wearing offloading shoe and admits to glucose levels being greater than 300 most of the week. She continues to smoke and expresses no desire to quit. There is one  area medially that probes deeper than it has historically, erythema to the toe and dorsal foot has consistently waxed and waned. There is no overt signs of cellulitis or infection but we will culture the wound for any occult infection given the new area of depth and erythema. We will hold off on Hernan, Emmylou A. ([id number]) sensitivities for initiation of antibiotic therapy. 02/22/18-She is here in follow up evaluation for left great toe ulcer. There is overall significant improvement in both wound appearance, erythema and edema with changes made last week. She was not initiated on antibiotic therapy. Culture obtained last week showed oxacillin sensitive staph aureus, sensitive to clindamycin. Clindamycin has been called into the pharmacy but she has been instructed to hold off on initiation secondary to overall clinical improvement and her history of antibiotic intolerance. She has been instructed to contact the clinic with any noted changes/deterioration and the wound, erythema, edema and/or pain. She will follow-up next week. She continues to smoke and her glucose levels remain elevated >250; she admits to compliance with offloading shoe 03/01/18 on evaluation today patient appears to be doing fairly well in regard to her left first toe ulcer. She has been tolerating the dressing changes with the [facility] without complication and overall this has definitely showed signs of improvement according to records as well is what the patient tells me today. I'm very pleased in that regard. She is having no pain today 03/08/18 She is here for follow up evaluation of a left great toe ulcer. She remains non-compliant with glucose control and smoking cessation; glucose levels consistently >200. She states that she got new shoe inserts/peg assist. She admits to compliance with offloading.  Since my last evaluation there is significant improvement. We will switch to prisma at this time and she will  follow up next week. She is noted to be tachycardic at this appointment, heart rate 120s; she has a history of heart rate 70-130 according to our records. She admits to extreme agitation r/t personal issues; she was advised to monitor her heartrate and contact her physician if it does not return to a more normal range (<100). She takes cardizem twice daily. 03/15/18-She is here in follow-up evaluation for left great toe ulcer. She remains noncompliant with glucose control and smoking cessation. She admits to compliance with wearing offloading shoe. The ulcer is improved/stable and we will continue with the same treatment plan and she will follow-up next week 03/22/18-She is here for evaluation for left great toe ulcer. There continues to be significant improvement despite recurrent hyperglycemia (over 500 yesterday) and she continues to smoke. She has been compliant with offloading and we will continue with same treatment plan and she will follow-up next week. 03/29/18-She is here for evaluation for left great toe ulcer. Despite continuing to smoke and uncontrolled diabetes she continues to improve. She is compliant with offloading shoe. We will continue with the same treatment plan and she will follow-up next week 04/05/18- She is here in follow up evaluation for a left great toe ulcer; she presents with small pustule to left fifth toe (resembles ant bite). She admits to compliance with wearing offloading shoe; continues to smoke or have uncontrolled blood glucose control. There is more callus than usual with evidence of bleeding; she denies known trauma. 04/12/18-She is here for evaluation of left great toe ulcer. Despite noncompliance with glycemic control and smoking she continues to make improvement. She continues to wear offloading shoe. The pustule, that was identified last week, to the left fifth toe is resolved. She will follow-up in 2 weeks 05/03/18-she is seen in follow-up evaluation for a left  great toe ulcer. She is compliant with offloading, otherwise noncompliant with glycemic control and smoking. She has plateaued and there is minimal improvement noted. We will transition to [facility], replaced the insert to her surgical shoe and she will follow-up in one week 05/10/18- She is here in follow up evaluation for a left great toe ulcer. It appears stable despite measurement change. We will continue with same treatment plan and follow up next week. 05/24/18-She is seen in follow-up evaluation for a left great toe ulcer. She remains compliant with offloading, has made significant improvement in her diet, decreasing the amount of sugar/soda. She said her recent A1c was 10.9 which is lower than. She did see a diabetic nutritionist/educator yesterday. She continues to smoke. We will continue with the same treatment plan and she'll follow-up next week. 05/31/18- She is seen in follow-up evaluation for left great toe ulcer. She continues to remain compliant with offloading, continues to make improvement in her diet, increasing her water and decreasing the amount of sugar/soda. She does continue to smoke with no desire to quit. We will apply Prisma to the depth and Hydrofera Blue over. We have not received insurance authorization for oasis. She will follow up next week. 06/07/18-She is seen in follow-up evaluation for left great toe ulcer. It has stalled according to today's measurements although base appears stable. She says she saw a diabetic educator yesterday; her average blood sugars are less than 300 which is an improvement for her. She continues to smoke and states "that's my next step" She continues with  water over soda. We will order for xray, culture and reinstate ace wrap compression prior to placing apligraf for next week. She is voicing no complaints or concerns. Her dressing will change to iodoflex over the next week in preparation for apligraf. 06/14/18-She is seen in follow-up  evaluation for left great toe ulcer. Plain film x-ray performed last week was negative for osteomyelitis. Wound culture obtained last week grew strep B and OSSA; she is initiated on keflex and cefdinir today; there is erythema to the toe which could be from ace wrap compression, she has a history of wrapping too tight and has has been encouraged to maintain ace wraps that we place today. We will hold off on application of apligraf today, will apply next week after antibiotic therapy has been initiated. She admits today that she has resumed taking a shower with her foot/toe submerged in water, she has been reminded to keep foot/toe out of the bath water. She will be seen in follow up next week 06/21/18-she is seen in follow-up evaluation for left great toe ulcer. She is tolerating antibiotic therapy with no GI disturbance. The wound is stable. Apligraf was applied today. She has been decreasing her smoking, only had 4 cigarettes yesterday and Tate, Kindal A. ([id number]) 1 today. She continues being more compliant in diabetic diet. She will follow-up next week for evaluation of site, if stable will remove at 2 weeks. 06/28/18- She is here in follow up evalution. Apligraf was placed last week, she states the dressing fell off on Tuesday and she was dressing with hydrofera blue. She is healed and will be discharged from the clinic today. She has been instructed to continue with smoking cessation, continue monitoring glucose levels, offloading for an additional 4 weeks and continue with hydrofera blue for additional two weeks for any possible microscopic opening. Readmission: 08/07/18 on evaluation today patient presents for reevaluation concerning the ulcer of her right great toe. She was previously discharged on 06/28/18 healed. Nonetheless she states that this began to show signs of drainage she subsequently went to her primary care provider. Subsequently an x-ray was performed on 08/01/18 which was  negative. The patient was also placed on antibiotics at that time. Fortunately they should have been effective for the infection. Nonetheless she's been experiencing some improvement but still has a lot of drainage coming from the wound itself. Wound History Patient presents with 1 open wound that has been present for approximately 1. Patient has been treating wound in the following manner: murpiricin. The wound has been healed in the past but has re-opened. Laboratory tests have been performed in the last month. Patient reportedly has tested positive for an antibiotic resistant organism. Patient reportedly has not tested positive for osteomyelitis. Patient reportedly has not had testing performed to evaluate circulation in the legs. Patient History Information obtained from Patient. Allergies erythromycin base Family History Cancer - Paternal Grandparents, Heart Disease - Paternal Grandparents, Hypertension - Paternal Grandparents, No family history of Diabetes, Hereditary Spherocytosis, Kidney Disease, Lung Disease, Seizures, Stroke, Thyroid Problems, Tuberculosis. Social History Current every day smoker, Marital Status - Single, Alcohol Use - Never, Drug Use - Prior History, Caffeine Use - Daily. Medical History Eyes Denies history of Cataracts, Glaucoma, Optic Neuritis Ear/Nose/Mouth/Throat Denies history of Chronic sinus problems/congestion, Middle ear problems Cardiovascular Patient has history of Arrhythmia Denies history of Congestive Heart Failure, Hypertension Endocrine Patient has history of Type II Diabetes Denies history of Type I Diabetes Integumentary (Skin) Denies history of History of Burn, History  of pressure wounds Neurologic Patient has history of Neuropathy - legs and feet Psychiatric Patient has history of Confinement Anxiety Denies history of Anorexia/bulimia Patient is treated with Insulin, Oral Agents. Blood sugar is tested. Blood sugar results noted at  the following times: Breakfast - 250. LESLYE, PUCCINI A. ([id number]) Review of Systems (ROS) Constitutional Symptoms (General Health) The patient has no complaints or symptoms. Eyes The patient has no complaints or symptoms. Ear/Nose/Mouth/Throat The patient has no complaints or symptoms. Hematologic/Lymphatic The patient has no complaints or symptoms. Respiratory The patient has no complaints or symptoms. Cardiovascular The patient has no complaints or symptoms. Gastrointestinal The patient has no complaints or symptoms. Endocrine The patient has no complaints or symptoms. Genitourinary The patient has no complaints or symptoms. Immunological The patient has no complaints or symptoms. Integumentary (Skin) Complains or has symptoms of Wounds. Denies complaints or symptoms of Bleeding or bruising tendency, Breakdown, Swelling. Musculoskeletal The patient has no complaints or symptoms. Neurologic The patient has no complaints or symptoms. Oncologic The patient has no complaints or symptoms. Psychiatric Complains or has symptoms of Anxiety. Denies complaints or symptoms of Claustrophobia. Objective Constitutional Well-nourished and well-hydrated in no acute distress. Vitals Time Taken: 8:56 AM, Height: 69 in, Weight: 240 lbs, BMI: 35.4, Temperature: 98 F, Pulse: 106 bpm, Respiratory Rate: 16 breaths/min, Blood Pressure: 118/91 mmHg. Respiratory normal breathing without difficulty. clear to auscultation bilaterally. Cardiovascular regular rate and rhythm with normal S1, S2. Psychiatric this patient is able to make decisions and demonstrates good insight into disease process. Alert and Oriented x 3. pleasant Brigance, Jodye A. ([id number]) and cooperative. General Notes: It would appear based upon inspection today with the callous overlying the wound bed that the patient may actually a patty wound opening that never truly and completely closed. Subsequently and then begin  to collect fluid, became infected, and because the issues that we're seeing at this point. Nonetheless debridement was performed today to remove some the callous from the surface of the wound as will [facility] from the surface of the wing. Post debridement the wound bed appears to be better but again this is something that we need to ensure doesn't just recover over leaving again in opening underlying. Integumentary (Hair, Skin) Wound #4 status is Open. Original cause of wound was Gradually Appeared. The wound is located on the Left Toe Great. The wound measures 2.2cm length x 0.9cm width x 0.1cm depth; 1.555cm^2 area and 0.156cm^3 volume. There is Fat Layer (Subcutaneous Tissue) Exposed exposed. There is no tunneling or undermining noted. There is a small amount of serous drainage noted. The wound margin is flat and intact. There is no granulation within the wound bed. There is a large (67-100%) amount of necrotic tissue within the wound bed including Eschar. The periwound skin appearance exhibited: Callus, Erythema. The periwound skin appearance did not exhibit: Crepitus, Excoriation, Induration, Rash, Scarring, Dry/Scaly, Maceration, Atrophie Blanche, Cyanosis, Ecchymosis, Hemosiderin Staining, Mottled, Pallor, Rubor. The surrounding wound skin color is noted with erythema which is circumferential. Periwound temperature was noted as Hot. Assessment Active Problems ICD-10 Type 2 diabetes mellitus with foot ulcer Cellulitis of left toe Non-pressure chronic ulcer of other part of left foot with fat layer exposed Procedures Wound #4 Pre-procedure diagnosis of Wound #4 is a Diabetic Wound/Ulcer of the Lower Extremity located on the Left Toe Great .Severity of Tissue Pre Debridement is: Fat layer exposed. There was a Excisional Skin/Subcutaneous Tissue Debridement with a total area of 1.98 sq cm performed by STONE III, HOYT  E., PA-C. With the following instrument(s): Curette to remove Viable and  Non-Viable tissue/material. Material removed includes Callus, Subcutaneous Tissue, and Slough after achieving pain control using Lidocaine 4% Topical Solution. No specimens were taken. A time out was conducted at 09:18, prior to the start of the procedure. A Minimum amount of bleeding was controlled with Pressure. The procedure was tolerated well with a pain level of 0 throughout and a pain level of 0 following the procedure. Post Debridement Measurements: 0.8cm length x 0.5cm width x 0.2cm depth; 0.063cm^3 volume. Character of Wound/Ulcer Post Debridement is improved. Severity of Tissue Post Debridement is: Fat layer exposed. Post procedure Diagnosis Wound #4: Same as Pre-Procedure Plan Amadon, Carlene A. ([id number]) Wound Cleansing: Wound #4 Left Toe Great: Clean wound with Normal Saline. Anesthetic (add to Medication List): Wound #4 Left Toe Great: Topical Lidocaine 4% cream applied to wound bed prior to debridement (In Clinic Only). Primary Wound Dressing: Wound #4 Left Toe Great: Silver Collagen Secondary Dressing: Wound #4 Left Toe Great: Conform/Kerlix Drawtex Dressing Change Frequency: Wound #4 Left Toe Great: Change dressing every other day. Follow-up Appointments: Wound #4 Left Toe Great: Return Appointment in 1 week. My suggestion currently is going to be that we initiate the above wound care measures for the next week. The patient is in agreement with plan. Will subsequently see were things stand at that point. Once the infection is under control I think that we will be in good shape in that regard. Nonetheless I'll let her complete the current antibiotic regimen that she has. This is doxycycline. If she still has an infection at the time when I see her back indicated by your theme around the toe or for that reason I may extend this for another weeks. Of time she was given 10 days initially. Patient is in agreement with plan. If anything else changes the meantime she will  let me know. Please see above for specific wound care orders. We will see patient for re-evaluation in 1 week(s) here in the clinic. If anything worsens or changes patient will contact our office for additional recommendations. Electronic Signature(s) Signed: 08/09/2018 5:08:41 PM By: Worthy Keeler PA-C Entered By: Worthy Keeler on 08/09/2018 10:24:31 [name] ([id number]) -------------------------------------------------------------------------------- ROS/PFSH Details Patient Name: Heather Dear A. Date of Service: 08/07/2018 8:45 AM Medical Record Number: [id number] Patient Account Number: [id number] Date of Birth/Sex: 12/22/1975 (42 y.o. F) Treating RN: Cornell Barman Primary Care Provider: Priscille Kluver Other Clinician: Referring Provider: Priscille Kluver Treating Provider/Extender: Melburn Hake, HOYT Weeks in Treatment: 0 Information Obtained From Patient Wound History Do you currently have one or more open woundso Yes How many open wounds do you currently haveo 1 Approximately how long have you had your woundso 1 How have you been treating your wound(s) until nowo murpiricin Has your wound(s) ever healed and then re-openedo Yes Have you had any lab work done in the past montho Yes Have you tested positive for an antibiotic resistant organism (MRSA, VRE)o Yes Have you tested positive for osteomyelitis (bone infection)o No Have you had any tests for circulation on your legso No Integumentary (Skin) Complaints and Symptoms: Positive for: Wounds Negative for: Bleeding or bruising tendency; Breakdown; Swelling Medical History: Negative for: History of Burn; History of pressure wounds Psychiatric Complaints and Symptoms: Positive for: Anxiety Negative for: Claustrophobia Medical History: Positive for: Confinement Anxiety Negative for: Anorexia/bulimia Constitutional Symptoms (General Health) Complaints and Symptoms: No Complaints or Symptoms Eyes Complaints and  Symptoms: No Complaints or Symptoms  Medical History: Negative for: Cataracts; Glaucoma; Optic Neuritis Ear/Nose/Mouth/Throat Complaints and Symptoms: No Complaints or Symptoms Casher, Haleigh A. ([id number]) Medical History: Negative for: Chronic sinus problems/congestion; Middle ear problems Hematologic/Lymphatic Complaints and Symptoms: No Complaints or Symptoms Medical History: Negative for: Anemia; Hemophilia; Human Immunodeficiency Virus; Lymphedema; Sickle Cell Disease Respiratory Complaints and Symptoms: No Complaints or Symptoms Medical History: Positive for: Chronic Obstructive Pulmonary Disease (COPD) Negative for: Aspiration; Asthma; Pneumothorax; Sleep Apnea; Tuberculosis Cardiovascular Complaints and Symptoms: No Complaints or Symptoms Medical History: Positive for: Arrhythmia Negative for: Angina; Congestive Heart Failure; Coronary Artery Disease; Deep Vein Thrombosis; Hypertension; Hypotension; Myocardial Infarction; Peripheral Arterial Disease; Peripheral Venous Disease; Phlebitis; Vasculitis Gastrointestinal Complaints and Symptoms: No Complaints or Symptoms Medical History: Negative for: Cirrhosis ; Colitis; Crohnos; Hepatitis A; Hepatitis B; Hepatitis C Endocrine Complaints and Symptoms: No Complaints or Symptoms Medical History: Positive for: Type II Diabetes Negative for: Type I Diabetes Time with diabetes: 10 years Treated with: Insulin, Oral agents Blood sugar tested every day: Yes Tested : 2 time daily Blood sugar testing results: Breakfast: 250 Genitourinary Complaints and Symptoms: No Complaints or Symptoms Medical HistoryMADISUN, HARGROVE A. ([id number]) Negative for: End Stage Renal Disease Immunological Complaints and Symptoms: No Complaints or Symptoms Medical History: Negative for: Lupus Erythematosus; Raynaudos; Scleroderma Musculoskeletal Complaints and Symptoms: No Complaints or Symptoms Medical History: Negative for: Gout;  Rheumatoid Arthritis; Osteoarthritis; Osteomyelitis Neurologic Complaints and Symptoms: No Complaints or Symptoms Medical History: Positive for: Neuropathy - legs and feet; Seizure Disorder Oncologic Complaints and Symptoms: No Complaints or Symptoms Medical History: Negative for: Received Chemotherapy; Received Radiation Immunizations Pneumococcal Vaccine: Received Pneumococcal Vaccination: Yes Immunization Notes: up to date Implantable Devices Family and Social History Cancer: Yes - Paternal Grandparents; Diabetes: No; Heart Disease: Yes - Paternal Grandparents; Hereditary Spherocytosis: No; Hypertension: Yes - Paternal Grandparents; Kidney Disease: No; Lung Disease: No; Seizures: No; Stroke: No; Thyroid Problems: No; Tuberculosis: No; Current every day smoker; Marital Status - Single; Alcohol Use: Never; Drug Use: Prior History; Caffeine Use: Daily; Financial Concerns: No; Food, Clothing or Shelter Needs: No; Support System Lacking: No; Transportation Concerns: No; Advanced Directives: No; Patient does not want information on Advanced Directives; Living Will: No; Medical Power of Attorney: No Electronic Signature(s) Signed: 08/08/2018 11:13:36 AM By: Worthy Keeler PA-C Signed: 08/08/2018 5:17:02 PM By: Gretta Cool, BSN, RN, CWS, [name] RN, BSN Entered By: Gretta Cool, BSN, RN, CWS, [name] on 08/07/2018 09:02:04 [name] ([id number]) -------------------------------------------------------------------------------- SuperBill Details Patient Name: Heather Dear A. Date of Service: 08/07/2018 Medical Record Number: [id number] Patient Account Number: [id number] Date of Birth/Sex: 12/07/1975 (42 y.o. F) Treating RN: Montey Hora Primary Care Provider: Priscille Kluver Other Clinician: Referring Provider: Priscille Kluver Treating Provider/Extender: Melburn Hake, HOYT Weeks in Treatment: 0 Diagnosis Coding ICD-10 Codes Code Description E11.621 Type 2 diabetes mellitus with foot ulcer L03.031  Cellulitis of right toe L97.512 Non-pressure chronic ulcer of other part of right foot with fat layer exposed Facility Procedures CPT4 Code: 38250539 Description: 99213 - WOUND CARE VISIT-LEV 3 EST PT Modifier: Quantity: 1 CPT4 Code: [id number] Description: 11042 - DEB SUBQ TISSUE 20 SQ CM/< ICD-10 Diagnosis Description L97.512 Non-pressure chronic ulcer of other part of right foot with fa Modifier: t layer exposed Quantity: 1 Physician Procedures CPT4 Code: 7902409 Description: 73532 - WC PHYS LEVEL 4 - EST PT ICD-10 Diagnosis Description E11.621 Type 2 diabetes mellitus with foot ulcer L03.031 Cellulitis of right toe L97.512 Non-pressure chronic ulcer of other part of right foot with fa Modifier: 25 t layer exposed Quantity: 1  CPT4 Code: 8677373 Description: 66815 - WC PHYS SUBQ TISS 20 SQ CM ICD-10 Diagnosis Description L97.512 Non-pressure chronic ulcer of other part of right foot with fa Modifier: t layer exposed Quantity: 1 Electronic Signature(s) Signed: 08/08/2018 11:13:36 AM By: Worthy Keeler PA-C Entered By: Worthy Keeler on 08/08/2018 10:41:58

## 2018-08-10 NOTE — Progress Notes (Addendum)
AIZAH, GEHLHAUSEN ([id number]) Visit Report for 08/07/2018 Allergy List Details Patient Name: [name], [name] Date of Service: 08/07/2018 8:45 AM Medical Record Number: [id number] Patient Account Number: [id number] Date of Birth/Sex: 1976-09-24 (42 y.o. F) Treating RN: Cornell Barman Primary Care Kiet Geer: Priscille Kluver Other Clinician: Referring Sarit Sparano: Priscille Kluver Treating Toshua Honsinger/Extender: Melburn Hake, HOYT Weeks in Treatment: 0 Allergies Active Allergies erythromycin base Allergy Notes Electronic Signature(s) Signed: 08/08/2018 5:17:02 PM By: Gretta Cool, BSN, RN, CWS, [name] RN, BSN Entered By: Gretta Cool, BSN, RN, CWS, [name] on 08/07/2018 08:57:26 [name] ([id number]) -------------------------------------------------------------------------------- Arrival Information Details Patient Name: Heather Dear A. Date of Service: 08/07/2018 8:45 AM Medical Record Number: [id number] Patient Account Number: [id number] Date of Birth/Sex: 1976-01-22 (42 y.o. F) Treating RN: Cornell Barman Primary Care Moishe Schellenberg: Priscille Kluver Other Clinician: Referring Thurza Kwiecinski: Priscille Kluver Treating Maia Handa/Extender: Melburn Hake, HOYT Weeks in Treatment: 0 Visit Information Patient Arrived: Wheel Chair Arrival Time: 08:55 Accompanied By: Mother's Husband Transfer Assistance: None Patient Identification Verified: Yes Secondary Verification Process Yes Completed: History Since Last Visit Added or deleted any medications: No Any new allergies or adverse reactions: No Had a fall or experienced change in activities of daily living that may affect risk of falls: No Signs or symptoms of abuse/neglect since last No visito Hospitalized since last visit: No Implantable device outside of the clinic excluding cellular tissue based products placed in the center since last visit: No Has Footwear/Offloading in Place as Prescribed: Yes Left: Surgical Shoe with Pressure Relief Insole Electronic Signature(s) Signed:  08/08/2018 5:17:02 PM By: Gretta Cool, BSN, RN, CWS, [name] RN, BSN Entered By: Gretta Cool, BSN, RN, CWS, [name] on 08/07/2018 09:08:12 [name] ([id number]) -------------------------------------------------------------------------------- Clinic Level of Care Assessment Details Patient Name: Heather Dear A. Date of Service: 08/07/2018 8:45 AM Medical Record Number: [id number] Patient Account Number: [id number] Date of Birth/Sex: 1976-10-14 (42 y.o. F) Treating RN: Montey Hora Primary Care Annalysa Mohammad: Priscille Kluver Other Clinician: Referring Kenichi Cassada: Priscille Kluver Treating Talitha Dicarlo/Extender: STONE III, HOYT Weeks in Treatment: 0 Clinic Level of Care Assessment Items TOOL 1 Quantity Score []  - Use when EandM and Procedure is performed on INITIAL visit 0 ASSESSMENTS - Nursing Assessment / Reassessment X - General Physical Exam (combine w/ comprehensive assessment (listed just below) when 1 20 performed on new pt. evals) X- 1 25 Comprehensive Assessment (HX, ROS, Risk Assessments, Wounds Hx, etc.) ASSESSMENTS - Wound and Skin Assessment / Reassessment []  - Dermatologic / Skin Assessment (not related to wound area) 0 ASSESSMENTS - Ostomy and/or Continence Assessment and Care []  - Incontinence Assessment and Management 0 []  - 0 Ostomy Care Assessment and Management (repouching, etc.) PROCESS - Coordination of Care X - Simple Patient / Family Education for ongoing care 1 15 []  - 0 Complex (extensive) Patient / Family Education for ongoing care X- 1 10 Staff obtains Programmer, systems, Records, Test Results / Process Orders []  - 0 Staff telephones HHA, Nursing Homes / Clarify orders / etc []  - 0 Routine Transfer to another Facility (non-emergent condition) []  - 0 Routine Hospital Admission (non-emergent condition) X- 1 15 New Admissions / Biomedical engineer / Ordering NPWT, Apligraf, etc. []  - 0 Emergency Hospital Admission (emergent condition) PROCESS - Special Needs []  - Pediatric / Minor  Patient Management 0 []  - 0 Isolation Patient Management []  - 0 Hearing / Language / Visual special needs []  - 0 Assessment of Community assistance (transportation, D/C planning, etc.) []  - 0 Additional assistance / Altered mentation []  - 0 Support Surface(s) Assessment (bed, cushion, seat, etc.)  AERIONNA, MORAVEK ([id number]) INTERVENTIONS - Miscellaneous []  - External ear exam 0 []  - 0 Patient Transfer (multiple staff / Harrel Lemon Lift / Similar devices) []  - 0 Simple Staple / Suture removal (25 or less) []  - 0 Complex Staple / Suture removal (26 or more) []  - 0 Hypo/Hyperglycemic Management (do not check if billed separately) []  - 0 Ankle / Brachial Index (ABI) - do not check if billed separately Has the patient been seen at the hospital within the last three years: Yes Total Score: 85 Level Of Care: New/Established - Level 3 Electronic Signature(s) Signed: 08/07/2018 5:26:03 PM By: Montey Hora Entered By: Montey Hora on 08/07/2018 09:20:29 [name] ([id number]) -------------------------------------------------------------------------------- Encounter Discharge Information Details Patient Name: Heather Dear A. Date of Service: 08/07/2018 8:45 AM Medical Record Number: [id number] Patient Account Number: [id number] Date of Birth/Sex: 1976/01/19 (42 y.o. F) Treating RN: Cornell Barman Primary Care Steffan Caniglia: Priscille Kluver Other Clinician: Referring Bayley Yarborough: Priscille Kluver Treating Jairen Goldfarb/Extender: Melburn Hake, HOYT Weeks in Treatment: 0 Encounter Discharge Information Items Discharge Condition: Stable Ambulatory Status: Wheelchair Discharge Destination: Home Transportation: Private Auto Accompanied By: Mom's husband Schedule Follow-up Appointment: Yes Clinical Summary of Care: Post Procedure Vitals: Temperature (F): 98 Pulse (bpm): 106 Respiratory Rate (breaths/min): 16 Blood Pressure (mmHg): 118/91 Electronic Signature(s) Signed: 08/07/2018 9:42:14 AM By:  Gretta Cool, BSN, RN, CWS, [name] RN, BSN Entered By: Gretta Cool, BSN, RN, CWS, [name] on 08/07/2018 09:42:14 [name] ([id number]) -------------------------------------------------------------------------------- Lower Extremity Assessment Details Patient Name: Heather Dear A. Date of Service: 08/07/2018 8:45 AM Medical Record Number: [id number] Patient Account Number: [id number] Date of Birth/Sex: [birth year] (42 y.o. F) Treating RN: Cornell Barman Primary Care Edyn Popoca: Priscille Kluver Other Clinician: Referring Darrel Baroni: Priscille Kluver Treating Ryelle Ruvalcaba/Extender: STONE III, HOYT Weeks in Treatment: 0 Vascular Assessment Pulses: Dorsalis Pedis Palpable: [Left:Yes] Posterior Tibial Palpable: [Left:Yes] Extremity colors, hair growth, and conditions: Extremity Color: [Left:Normal] Hair Growth on Extremity: [Left:Yes] Temperature of Extremity: [Left:Warm] Capillary Refill: [Left:< 3 seconds] Toe Nail Assessment Left: Right: Thick: No Discolored: No Deformed: No Improper Length and Hygiene: No Electronic Signature(s) Signed: 08/08/2018 5:17:02 PM By: Gretta Cool, BSN, RN, CWS, [name] RN, BSN Entered By: Gretta Cool, BSN, RN, CWS, [name] on 08/07/2018 09:07:42 [name] ([id number]) -------------------------------------------------------------------------------- Multi Wound Chart Details Patient Name: Heather Dear A. Date of Service: 08/07/2018 8:45 AM Medical Record Number: [id number] Patient Account Number: [id number] Date of Birth/Sex: [birth year] (42 y.o. 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] [N/A:N/A] Comorbid History: [4:Chronic Obstructive Pulmonary Disease (COPD), Arrhythmia, Type II Diabetes, Neuropathy, Seizure Disorder, Confinement Anxiety] [N/A:N/A] Date Acquired: [4:07/24/2018] [N/A:N/A] Weeks of Treatment: [4:0] [N/A:N/A] Wound Status: [4:Open] [N/A:N/A] Pending Amputation on [4:Yes] [N/A:N/A] Presentation: Measurements L x W x D [4:2.2x0.9x0.1] [N/A:N/A] (cm) Area (cm) : [4:1.555] [N/A:N/A] Volume (cm) : [4:0.156] [N/A:N/A] Classification: [4:Grade 1] [N/A:N/A] Exudate Amount: [4:Small] [N/A:N/A] Exudate Type: [4:Serous] [N/A:N/A] Exudate Color: [4:amber] [N/A:N/A] Wound Margin: [4:Flat and Intact] [N/A:N/A] Granulation Amount: [4:None Present (0%)] [N/A:N/A] Necrotic Amount: [4:Large (67-100%)] [N/A:N/A] Necrotic Tissue: [4:Eschar] [N/A:N/A] Exposed Structures: [4:Fat Layer (Subcutaneous Tissue) Exposed: Yes Fascia: No Tendon: No Muscle: No Joint: No Bone: No] [N/A:N/A] Epithelialization: [4:None] [N/A:N/A] Debridement: [4:Debridement - Excisional 09:18] [  N/A:N/A N/A] Pre-procedure Verification/Time Out Taken: Pain Control: Lidocaine 4% Topical Solution N/A N/A Tissue Debrided: Callus, Subcutaneous, Slough N/A N/A Level: Skin/Subcutaneous Tissue N/A N/A Debridement Area (sq cm): 1.98 N/A N/A Instrument: Curette N/A N/A Bleeding: Minimum N/A N/A Hemostasis Achieved: Pressure N/A N/A Procedural Pain: 0 N/A N/A Post Procedural Pain: 0 N/A N/A Debridement Treatment Procedure was tolerated well N/A N/A Response: Post Debridement 0.8x0.5x0.2 N/A N/A Measurements L x W x D (cm) Post Debridement Volume: 0.063 N/A N/A (cm) Periwound Skin Texture: Callus: Yes N/A N/A Excoriation: No Induration: No Crepitus: No Rash: No Scarring: No Periwound Skin Moisture: Maceration: No N/A N/A Dry/Scaly: No Periwound Skin Color: Erythema: Yes N/A  N/A Atrophie Blanche: No Cyanosis: No Ecchymosis: No Hemosiderin Staining: No Mottled: No Pallor: No Rubor: No Erythema Location: Circumferential N/A N/A Temperature: Hot N/A N/A Tenderness on Palpation: No N/A N/A Wound Preparation: Ulcer Cleansing: N/A N/A Rinsed/Irrigated with Saline Topical Anesthetic Applied: Other: lidocaine 4% Procedures Performed: Debridement N/A N/A Treatment Notes Electronic Signature(s) Signed: 08/07/2018 5:26:03 PM By: Montey Hora Entered By: Montey Hora on 08/07/2018 09:25:22 [name] ([id number]) -------------------------------------------------------------------------------- [place] Details Patient Name: Heather Dear A. Date of Service: 08/07/2018 8:45 AM Medical Record Number: [id number] Patient Account Number: [id number] Date of Birth/Sex: Mar 11, 1976 (42 y.o. F) Treating RN: Montey Hora Primary Care Frona Yost: Priscille Kluver Other Clinician: Referring Aldred Mase: Priscille Kluver Treating Marylynn Rigdon/Extender: Melburn Hake, HOYT Weeks in Treatment: 0 Active Inactive ` Abuse / Safety / Falls / Self Care Management Nursing Diagnoses: History of Falls Goals: Patient will not experience any injury related to falls Date Initiated: 08/07/2018 Target Resolution Date: 10/19/2018 Goal Status: Active Interventions: Assess fall risk on admission and as needed Notes: ` Orientation to the Wound Care Program Nursing Diagnoses: Knowledge deficit related to the wound healing center program Goals: Patient/caregiver will verbalize understanding of the [place] Date Initiated: 08/07/2018 Target Resolution Date: 10/19/2018 Goal Status: Active Interventions: Provide education on orientation to the wound center Notes: ` Soft Tissue Infection Nursing Diagnoses: Impaired tissue integrity Goals: Patient will remain free of wound infection Date Initiated: 08/07/2018 Target Resolution Date:  10/19/2018 Goal Status: Active Interventions: Thibeaux, [name] ([id number]) Assess signs and symptoms of infection every visit Notes: ` Wound/Skin Impairment Nursing Diagnoses: Impaired tissue integrity Goals: Ulcer/skin breakdown will heal within 14 weeks Date Initiated: 08/07/2018 Target Resolution Date: 10/19/2018 Goal Status: Active Interventions: Assess patient/caregiver ability to obtain necessary supplies Notes: Electronic Signature(s) Signed: 08/07/2018 5:26:03 PM By: Montey Hora Entered By: Montey Hora on 08/07/2018 09:29:39 [name] ([id number]) -------------------------------------------------------------------------------- Pain Assessment Details Patient Name: Heather Dear A. Date of Service: 08/07/2018 8:45 AM Medical Record Number: [id number] Patient Account Number: [id number] Date of Birth/Sex: [birth year] (42 y.o. F) Treating RN: Cornell Barman Primary Care Emmons Toth: Priscille Kluver Other Clinician: Referring Janalyn Higby: Priscille Kluver Treating Leonidas Boateng/Extender: Melburn Hake, HOYT Weeks in Treatment: 0 Active Problems Location of Pain Severity and Description of Pain Patient Has Paino No Site Locations Pain Management and Medication Current Pain Management: Electronic Signature(s) Signed: 08/08/2018 5:17:02 PM By: Gretta Cool, BSN, RN, CWS, [name] RN, BSN Entered By: Gretta Cool, BSN, RN, CWS, [name] on 08/07/2018 08:56:42 [name] ([id number]) -------------------------------------------------------------------------------- Patient/Caregiver Education Details Patient Name: Heather Dear A. Date of Service: 08/07/2018 8:45 AM Medical Record Number: [id number] Patient Account Number: [id number] Date of Birth/Gender: May 09, 1976 (42 y.o. F) Treating RN: Montey Hora Primary Care Physician: Priscille Kluver Other Clinician: Referring Physician: Priscille Kluver Treating Physician/Extender: STONE III, HOYT Weeks in Treatment: 0 Education  Assessment Education Provided  To: Patient and Caregiver Education Topics Provided Offloading: Handouts: Other: ways to offload this area and TCC Methods: Explain/Verbal Responses: State content correctly Electronic Signature(s) Signed: 08/07/2018 5:26:03 PM By: Montey Hora Entered By: Montey Hora on 08/07/2018 09:28:35 [name] ([id number]) -------------------------------------------------------------------------------- Wound Assessment Details Patient Name: Heather Dear A. Date of Service: 08/07/2018 8:45 AM Medical Record Number: [id number] Patient Account Number: [id number] Date of Birth/Sex: 04-20-1976 (42 y.o. F) Treating RN: Montey Hora Primary Care Melisha Eggleton: Priscille Kluver Other Clinician: Referring Allissa Albright: Priscille Kluver Treating Marzell Isakson/Extender: STONE III, HOYT Weeks in Treatment: 0 Wound Status Wound Number: 4 Primary Diabetic Wound/Ulcer of the Lower Extremity Etiology: Wound Location: Left Toe Great Wound Open Wounding Event: Gradually Appeared Status: Date Acquired: 07/24/2018 Comorbid Chronic Obstructive Pulmonary Disease Weeks Of Treatment: 0 History: (COPD), Arrhythmia, Type II Diabetes, Clustered Wound: No Neuropathy, Seizure Disorder, Confinement Pending Amputation On Presentation Anxiety Photos Photo Uploaded By: Secundino Ginger on 08/07/2018 10:09:15 Wound Measurements Length: (cm) 2.2 % Reductio Width: (cm) 0.9 % Reductio Depth: (cm) 0.1 Epithelial Area: (cm) 1.555 Tunneling Volume: (cm) 0.156 Undermini n in Area: n in Volume: ization: None : No ng: No Wound Description Classification: Grade 1 Foul Odor Wound Margin: Flat and Intact Slough/Fi Exudate Amount: Small Exudate Type: Serous Exudate Color: amber After Cleansing: No brino No Wound Bed Granulation Amount: None Present (0%) Exposed Structure Necrotic Amount: Large (67-100%) Fascia Exposed: No Necrotic Quality: Eschar Fat Layer (Subcutaneous Tissue) Exposed: Yes Tendon Exposed: No Muscle  Exposed: No Joint Exposed: No Bone Exposed: No Hoog, Akiya A. ([id number]) Periwound Skin Texture Texture Color No Abnormalities Noted: No No Abnormalities Noted: No Callus: Yes Atrophie Blanche: No Crepitus: No Cyanosis: No Excoriation: No Ecchymosis: No Induration: No Erythema: Yes Rash: No Erythema Location: Circumferential Scarring: No Hemosiderin Staining: No Mottled: No Moisture Pallor: No No Abnormalities Noted: No Rubor: No Dry / Scaly: No Maceration: No Temperature / Pain Temperature: Hot Wound Preparation Ulcer Cleansing: Rinsed/Irrigated with Saline Topical Anesthetic Applied: Other: lidocaine 4%, Treatment Notes Wound #4 (Left Toe Great) 1. Cleansed with: Clean wound with Normal Saline 2. Anesthetic Topical Lidocaine 4% cream to wound bed prior to debridement 4. Dressing Applied: Prisma Ag Notes Drawtex, conform, secured with tape Electronic Signature(s) Signed: 08/07/2018 5:26:03 PM By: Montey Hora Entered By: Montey Hora on 08/07/2018 09:19:58 [name] ([id number]) -------------------------------------------------------------------------------- Vitals Details Patient Name: Heather Dear A. Date of Service: 08/07/2018 8:45 AM Medical Record Number: [id number] Patient Account Number: [id number] Date of Birth/Sex: 02-26-1976 (42 y.o. F) Treating RN: Cornell Barman Primary Care Nocole Zammit: Priscille Kluver Other Clinician: Referring Lawayne Hartig: Priscille Kluver Treating Kaisei Gilbo/Extender: STONE III, HOYT Weeks in Treatment: 0 Vital Signs Time Taken: 08:56 Temperature (F): 98 Height (in): 69 Pulse (bpm): 106 Weight (lbs): 240 Respiratory Rate (breaths/min): 16 Body Mass Index (BMI): 35.4 Blood Pressure (mmHg): 118/91 Reference Range: 80 - 120 mg / dl Electronic Signature(s) Signed: 08/08/2018 5:17:02 PM By: Gretta Cool, BSN, RN, CWS, [name] RN, BSN Entered By: Gretta Cool, BSN, RN, CWS, [name] on 08/07/2018 08:57:15

## 2018-08-10 NOTE — Progress Notes (Signed)
KALAYLA, SHADDEN ([id number]) Visit Report for 08/07/2018 Abuse/Suicide Risk Screen Details Patient Name: [name], [name] Date of Service: 08/07/2018 8:45 AM Medical Record Number: [id number] Patient Account Number: [id number] Date of Birth/Sex: 1976/04/21 (42 y.o. F) Treating RN: Cornell Barman Primary Care Roshell Brigham: Priscille Kluver Other Clinician: Referring Tniya Bowditch: Priscille Kluver Treating Adonus Uselman/Extender: STONE III, HOYT Weeks in Treatment: 0 Abuse/Suicide Risk Screen Items Answer ABUSE/SUICIDE RISK SCREEN: Has anyone close to you tried to hurt or harm you recentlyo No Do you feel uncomfortable with anyone in your familyo No Has anyone forced you do things that you didnot want to doo No Do you have any thoughts of harming yourselfo No Patient displays signs or symptoms of abuse and/or neglect. No Electronic Signature(s) Signed: 08/08/2018 5:17:02 PM By: Gretta Cool, BSN, RN, CWS, [name] RN, BSN Entered By: Gretta Cool, BSN, RN, CWS, [name] on 08/07/2018 09:02:14 [name] ([id number]) -------------------------------------------------------------------------------- Activities of Daily Living Details Patient Name: Heather Dear A. Date of Service: 08/07/2018 8:45 AM Medical Record Number: [id number] Patient Account Number: [id number] Date of Birth/Sex: 1976/06/20 (43 y.o. F) Treating RN: Cornell Barman Primary Care Georgene Kopper: Priscille Kluver Other Clinician: Referring Lateef Juncaj: Priscille Kluver Treating Rip Hawes/Extender: Melburn Hake, HOYT Weeks in Treatment: 0 Activities of Daily Living Items Answer Activities of Daily Living (Please select one for each item) Drive Automobile Not Able Take Medications Completely Able Use Telephone Completely Able Care for Appearance Completely Able Use Toilet Completely Able Bath / Shower Need Assistance Dress Self Completely Able Feed Self Completely Able Walk Completely Able Get In / Out Bed Completely Able Housework Completely Able Prepare Meals Completely  Able Handle Money Completely Able Shop for Self Completely Able Electronic Signature(s) Signed: 08/08/2018 5:17:02 PM By: Gretta Cool, BSN, RN, CWS, [name] RN, BSN Entered By: Gretta Cool, BSN, RN, CWS, [name] on 08/07/2018 09:02:45 [name] ([id number]) -------------------------------------------------------------------------------- Education Assessment Details Patient Name: Heather Dear A. Date of Service: 08/07/2018 8:45 AM Medical Record Number: [id number] Patient Account Number: [id number] Date of Birth/Sex: [birth year] (42 y.o. F) Treating RN: Cornell Barman Primary Care Cheryn Lundquist: Priscille Kluver Other Clinician: Referring Masayuki Sakai: Priscille Kluver Treating [place] Perra/Extender: Melburn Hake, HOYT Weeks in Treatment: 0 Primary Learner Assessed: Patient Learning Preferences/Education Level/Primary Language Learning Preference: Explanation, Demonstration Highest Education Level: High School Preferred Language: English Cognitive Barrier Assessment/Beliefs Language Barrier: No Translator Needed: No Memory Deficit: No Emotional Barrier: No Cultural/Religious Beliefs Affecting Medical Care: No Physical Barrier Assessment Impaired Vision: Yes Glasses Impaired Hearing: No Decreased Hand dexterity: No Knowledge/Comprehension Assessment Knowledge Level: Medium Comprehension Level: Medium Ability to understand written Medium instructions: Ability to understand verbal Medium instructions: Motivation Assessment Anxiety Level: Calm Cooperation: Cooperative Education Importance: Acknowledges Need Interest in Health Problems: Asks Questions Perception: Coherent Willingness to Engage in Self- Medium Management Activities: Readiness to Engage in Self- Medium Management Activities: Electronic Signature(s) Signed: 08/08/2018 5:17:02 PM By: Gretta Cool, BSN, RN, CWS, [name] RN, BSN Entered By: Gretta Cool, BSN, RN, CWS, [name] on 08/07/2018 09:03:15 [name]  ([id number]) -------------------------------------------------------------------------------- Fall Risk Assessment Details Patient Name: Heather Dear A. Date of Service: 08/07/2018 8:45 AM Medical Record Number: [id number] Patient Account Number: [id number] Date of Birth/Sex: [birth year] (42 y.o. F) Treating RN: Cornell Barman Primary Care Khian Remo: Priscille Kluver Other Clinician: Referring Koben Daman: Priscille Kluver Treating Courtne Lighty/Extender: Melburn Hake, HOYT Weeks in Treatment: 0 Fall Risk Assessment Items Have you had 2 or more falls in the last 12 monthso 0 Yes Have you had any fall that resulted in injury in the last 12 monthso 0 No FALL RISK ASSESSMENT: History  of falling - immediate or within 3 months 25 Yes Secondary diagnosis 0 No Ambulatory aid None/bed rest/wheelchair/nurse 0 No Crutches/cane/walker 15 Yes Furniture 0 No IV Access/Saline Lock 0 No Gait/Training Normal/bed rest/immobile 0 No Weak 10 Yes Impaired 0 No Mental Status Oriented to own ability 0 No Electronic Signature(s) Signed: 08/08/2018 5:17:02 PM By: Gretta Cool, BSN, RN, CWS, [name] RN, BSN Entered By: Gretta Cool, BSN, RN, CWS, [name] on 08/07/2018 09:04:11 [name] ([id number]) -------------------------------------------------------------------------------- Foot Assessment Details Patient Name: Heather Dear A. Date of Service: 08/07/2018 8:45 AM Medical Record Number: [id number] Patient Account Number: [id number] Date of Birth/Sex: 1976-08-26 (42 y.o. F) Treating RN: Cornell Barman Primary Care Anjel Pardo: Priscille Kluver Other Clinician: Referring Zakyla Tonche: Priscille Kluver Treating Veora Fonte/Extender: STONE III, HOYT Weeks in Treatment: 0 Foot Assessment Items Site Locations + = Sensation present, - = Sensation absent, C = Callus, U = Ulcer R = Redness, W = Warmth, M = Maceration, PU = Pre-ulcerative lesion F = Fissure, S = Swelling, D = Dryness Assessment Right: Left: Other Deformity: No No Prior Foot Ulcer: No  No Prior Amputation: No No Charcot Joint: No No Ambulatory Status: Ambulatory With Help Assistance Device: Walker Gait: Administrator, arts) Signed: 08/08/2018 5:17:02 PM By: Gretta Cool, BSN, RN, CWS, [name] RN, BSN Entered By: Gretta Cool, BSN, RN, CWS, [name] on 08/07/2018 [place], [place]. ([id number]) -------------------------------------------------------------------------------- Nutrition Risk Assessment Details Patient Name: Heather Dear A. Date of Service: 08/07/2018 8:45 AM Medical Record Number: [id number] Patient Account Number: [id number] Date of Birth/Sex: Jul 17, 1976 (42 y.o. F) Treating RN: Cornell Barman Primary Care Maysun Meditz: Priscille Kluver Other Clinician: Referring Manasvi Dickard: Priscille Kluver Treating Latonda Larrivee/Extender: STONE III, HOYT Weeks in Treatment: 0 Height (in): 69 Weight (lbs): 240 Body Mass Index (BMI): 35.4 Nutrition Risk Assessment Items NUTRITION RISK SCREEN: I have an illness or condition that made me change the kind and/or amount of 0 No food I eat I eat fewer than two meals per day 0 No I eat few fruits and vegetables, or milk products 0 No I have three or more drinks of beer, liquor or wine almost every day 0 No I have tooth or mouth problems that make it hard for me to eat 0 No I don't always have enough money to buy the food I need 0 No I eat alone most of the time 0 No I take three or more different prescribed or over-the-counter drugs a day 1 Yes Without wanting to, I have lost or gained 10 pounds in the last six months 0 No I am not always physically able to shop, cook and/or feed myself 0 No Nutrition Protocols Good Risk Protocol 0 No interventions needed Moderate Risk Protocol Electronic Signature(s) Signed: 08/08/2018 5:17:02 PM By: Gretta Cool, BSN, RN, CWS, [name] RN, BSN Entered By: Gretta Cool, BSN, RN, CWS, [name] on 08/07/2018 09:04:27

## 2018-08-14 ENCOUNTER — Encounter: Payer: Medicaid Other | Admitting: Physician Assistant

## 2018-08-14 DIAGNOSIS — E11622 Type 2 diabetes mellitus with other skin ulcer: Secondary | ICD-10-CM | POA: Diagnosis not present

## 2018-08-15 ENCOUNTER — Ambulatory Visit: Payer: Self-pay | Admitting: Internal Medicine

## 2018-08-17 NOTE — Progress Notes (Signed)
[name], [name] ([id number]) Visit Report for 08/14/2018 Chief Complaint Document Details Patient Name: [name], [name] Date of Service: 08/14/2018 9:30 AM Medical Record Number: [id number] Patient Account Number: [id number] Date of Birth/Sex: May 16, 1976 (42 y.o. F) Treating RN: Montey Hora Primary Care Provider: Priscille Kluver Other Clinician: Referring Provider: Priscille Kluver Treating Provider/Extender: Melburn Hake, Parth Mccormac Weeks in Treatment: 1 Information Obtained from: Patient Chief Complaint She is here for evaluation of a left great toe ulcer Electronic Signature(s) Signed: 08/16/2018 1:45:00 AM By: Worthy Keeler PA-C Entered By: Worthy Keeler on 08/14/2018 10:02:26 [name] ([id number]) -------------------------------------------------------------------------------- Debridement Details Patient Name: Heather Dear A. Date of Service: 08/14/2018 9:30 AM Medical Record Number: [id number] Patient Account Number: [id number] Date of Birth/Sex: [birth year] (42 y.o. F) Treating RN: Cornell Barman Primary Care Provider: Priscille Kluver Other Clinician: Referring Provider: Priscille Kluver Treating Provider/Extender: Melburn Hake, Kayden Amend Weeks in Treatment: 1 Debridement Performed for Wound #4 Left Toe Great Assessment: Performed By: Physician STONE III, Elvis Boot E., PA-C Debridement Type: Debridement Severity of Tissue Pre Fat layer exposed Debridement: Level of Consciousness (Pre- Awake and Alert procedure): Pre-procedure Verification/Time Yes - 10:04 Out Taken: Start Time: 10:04 Pain Control: Lidocaine Total Area Debrided (L x W): 1 (cm) x 0.3 (cm) = 0.3 (cm) Tissue and other material Viable, Non-Viable, Callus, Subcutaneous debrided: Level: Skin/Subcutaneous Tissue Debridement Description: Excisional Instrument: Curette Bleeding: Moderate Hemostasis Achieved: Pressure End Time: 10:07 Procedural Pain: 0 Post Procedural Pain: 0 Response to Treatment: Procedure was  tolerated well Level of Consciousness Awake and Alert (Post-procedure): Post Debridement Measurements of Total Wound Length: (cm) 1 Width: (cm) 0.5 Depth: (cm) 0.6 Volume: (cm) 0.236 Character of Wound/Ulcer Post Debridement: Requires Further Debridement Severity of Tissue Post Debridement: Fat layer exposed Post Procedure Diagnosis Same as Pre-procedure Electronic Signature(s) Signed: 08/14/2018 4:14:49 PM By: Gretta Cool, BSN, RN, CWS, [name] RN, BSN Signed: 08/16/2018 1:45:00 AM By: Worthy Keeler PA-C Entered By: Gretta Cool, BSN, RN, CWS, [name] on 08/14/2018 10:09:04 [name] ([id number]) -------------------------------------------------------------------------------- HPI Details Patient Name: Heather Dear A. Date of Service: 08/14/2018 9:30 AM Medical Record Number: [id number] Patient Account Number: [id number] Date of Birth/Sex: [birth year] (42 y.o. F) Treating RN: Montey Hora Primary Care Provider: Priscille Kluver Other Clinician: Referring Provider: Priscille Kluver Treating Provider/Extender: Melburn Hake, Evertt Chouinard Weeks in Treatment: 1 History of Present Illness HPI Description: 01/18/18-She is here for initial evaluation of the left great toe ulcer. She is a poor historian in regards to timeframe in detail. She states approximately 4 weeks ago she lacerated her toe on something in the house. She followed up with her primary care who placed her on Bactrim and ultimately a second dose of Bactrim prior to coming to wound clinic. She states she has been treating the toe with peroxide, Betadine and a Band-Aid. She did not check her blood sugar this morning but checked it yesterday morning it was 327; she is unaware of a recent A1c and there are no current records. She saw Dr. she would've orthopedics last week for an old injury to the left ankle, she states he did not see her toe, nor did she bring it to his attention. She smokes approximately 1 pack cigarettes a day. Her social situation is  concerning, she arrives this morning with her mother who appears extremely intoxicated/under the influence; her mother was asked to leave the room and be monitored by the patient's grandmother. The patient's aunt then accompanied the patient and the room throughout the rest of the appointment. We had  a lengthy discussion regarding the deleterious effects of uncontrolled hyperglycemia and smoking as it relates to wound healing and overall health. She was strongly encouraged to decrease her smoking and get her diabetes under better control. She states she is currently on a diet and has cut down her [facility] consumption. The left toe is erythematous, macerated and slightly edematous with malodor present. The edema in her left foot is below her baseline, there is no erythema streaking. We will treat her with Santyl, doxycycline; we have ordered and xray, culture and provided a Peg assist surgical shoe and cultured the wound. 01/25/18-She is here in follow-up evaluation for a left great toe ulcer and presents with an abscess to her suprapubic area. She states her blood sugars remain elevated, feeling "sick" and if levels are below 250, but she is trying. She has made no attempt to decrease her smoking stating that we "can't take away her food in her cigarettes". She has been compliant with offloading using the PEG assist you. She is using Santyl daily. the culture obtained last week grew staph aureus and Enterococcus faecalis; continues on the doxycycline and Augmentin was added on Monday. The suprapubic area has erythema, no femoral variation, purple discoloration, minimal induration, was accessed with a cotton tip applicator with sanguinopurulent drainage, this was cultured, I suspect the current antibiotic treatment will cover and we will not add anything to her current treatment plan. She was advised to go to urgent care or ER with any change in redness, induration or fever. 02/01/18-She is here in  follow-up evaluation for left great toe ulcers and a new abdominal abscess from last week. She was able to use packing until earlier this week, where she "forgot it was there". She states she was feeling ill with GI symptoms last week and was not taking her antibiotic. She states her glucose levels have been predominantly less than 200, with occasional levels between 200-250. She thinks this was contributing to her GI symptoms as they have resolved without intervention. There continues to be significant laceration to left toe, otherwise it clinically looks stable/improved. There is now less superficial opening to the lateral aspect of the great toe that was residual blister. We will transition to [facility] to all wounds, she will continue her Augmentin. If there is no change or deterioration next week for reculture. 02/08/18-She is here in follow-up evaluation for left great toe ulcer and abdominal ulcer. There is an improvement in both wounds. She has been wrapping her left toe with coban, not by our direction, which has created an area of discoloration to the medial aspect; she has been advised to NOT use coban secondary to her neuropathy. She states her glucose levels have been high over this last week ranging from 200-350, she continues to smoke. She admits to being less compliant with her offloading shoe. We will continue with same treatment plan and she will follow-up next week. 02/15/18-She is here in follow-up evaluation for left great toe ulcer and abdominal ulcer. The abdominal ulcer is epithelialized. The left great toe ulcer is improved and all injury from last week using the Coban wrap is resolved, the lateral ulcer is healed. She admits to noncompliance with wearing offloading shoe and admits to glucose levels being greater than 300 most of the week. She continues to smoke and expresses no desire to quit. There is one area medially that probes deeper than it has historically,  erythema to the toe and dorsal foot has consistently waxed and  waned. There is no overt signs of cellulitis or infection but we will culture the wound for any occult infection given the new area of depth and erythema. We will hold off on sensitivities for initiation of antibiotic therapy. 02/22/18-She is here in follow up evaluation for left great toe ulcer. There is overall significant improvement in both wound appearance, erythema and edema with changes made last week. She was not initiated on antibiotic therapy. Culture obtained last week showed oxacillin sensitive staph aureus, sensitive to clindamycin. Clindamycin has been called into the pharmacy but she has been instructed to hold off on initiation secondary to overall clinical improvement and her history of antibiotic intolerance. She has been instructed to contact the clinic with any noted changes/deterioration and the wound, erythema, [name], [name] ([id number]) edema and/or pain. She will follow-up next week. She continues to smoke and her glucose levels remain elevated >250; she admits to compliance with offloading shoe 03/01/18 on evaluation today patient appears to be doing fairly well in regard to her left first toe ulcer. She has been tolerating the dressing changes with the [facility] Dressing without complication and overall this has definitely showed signs of improvement according to records as well is what the patient tells me today. I'm very pleased in that regard. She is having no pain today 03/08/18 She is here for follow up evaluation of a left great toe ulcer. She remains non-compliant with glucose control and smoking cessation; glucose levels consistently >200. She states that she got new shoe inserts/peg assist. She admits to compliance with offloading. Since my last evaluation there is significant improvement. We will switch to prisma at this time and she will follow up next week. She is noted to be tachycardic at this  appointment, heart rate 120s; she has a history of heart rate 70-130 according to our records. She admits to extreme agitation r/t personal issues; she was advised to monitor her heartrate and contact her physician if it does not return to a more normal range (<100). She takes cardizem twice daily. 03/15/18-She is here in follow-up evaluation for left great toe ulcer. She remains noncompliant with glucose control and smoking cessation. She admits to compliance with wearing offloading shoe. The ulcer is improved/stable and we will continue with the same treatment plan and she will follow-up next week 03/22/18-She is here for evaluation for left great toe ulcer. There continues to be significant improvement despite recurrent hyperglycemia (over 500 yesterday) and she continues to smoke. She has been compliant with offloading and we will continue with same treatment plan and she will follow-up next week. 03/29/18-She is here for evaluation for left great toe ulcer. Despite continuing to smoke and uncontrolled diabetes she continues to improve. She is compliant with offloading shoe. We will continue with the same treatment plan and she will follow-up next week 04/05/18- She is here in follow up evaluation for a left great toe ulcer; she presents with small pustule to left fifth toe (resembles ant bite). She admits to compliance with wearing offloading shoe; continues to smoke or have uncontrolled blood glucose control. There is more callus than usual with evidence of bleeding; she denies known trauma. 04/12/18-She is here for evaluation of left great toe ulcer. Despite noncompliance with glycemic control and smoking she continues to make improvement. She continues to wear offloading shoe. The pustule, that was identified last week, to the left fifth toe is resolved. She will follow-up in 2 weeks 05/03/18-she is seen in follow-up evaluation for a  left great toe ulcer. She is compliant with offloading,  otherwise noncompliant with glycemic control and smoking. She has plateaued and there is minimal improvement noted. We will transition to [facility], replaced the insert to her surgical shoe and she will follow-up in one week 05/10/18- She is here in follow up evaluation for a left great toe ulcer. It appears stable despite measurement change. We will continue with same treatment plan and follow up next week. 05/24/18-She is seen in follow-up evaluation for a left great toe ulcer. She remains compliant with offloading, has made significant improvement in her diet, decreasing the amount of sugar/soda. She said her recent A1c was 10.9 which is lower than. She did see a diabetic nutritionist/educator yesterday. She continues to smoke. We will continue with the same treatment plan and she'll follow-up next week. 05/31/18- She is seen in follow-up evaluation for left great toe ulcer. She continues to remain compliant with offloading, continues to make improvement in her diet, increasing her water and decreasing the amount of sugar/soda. She does continue to smoke with no desire to quit. We will apply Prisma to the depth and Hydrofera Blue over. We have not received insurance authorization for oasis. She will follow up next week. 06/07/18-She is seen in follow-up evaluation for left great toe ulcer. It has stalled according to today's measurements although base appears stable. She says she saw a diabetic educator yesterday; her average blood sugars are less than 300 which is an improvement for her. She continues to smoke and states "that's my next step" She continues with water over soda. We will order for xray, culture and reinstate ace wrap compression prior to placing apligraf for next week. She is voicing no complaints or concerns. Her dressing will change to iodoflex over the next week in preparation for apligraf. 06/14/18-She is seen in follow-up evaluation for left great toe ulcer. Plain film x-ray  performed last week was negative for osteomyelitis. Wound culture obtained last week grew strep B and OSSA; she is initiated on keflex and cefdinir today; there is erythema to the toe which could be from ace wrap compression, she has a history of wrapping too tight and has has been encouraged to maintain ace wraps that we place today. We will hold off on application of apligraf today, will apply next week after antibiotic therapy has been initiated. She admits today that she has resumed taking a shower with her foot/toe submerged in water, she has been reminded to keep foot/toe out of the bath water. She will be seen in follow up next week 06/21/18-she is seen in follow-up evaluation for left great toe ulcer. She is tolerating antibiotic therapy with no GI disturbance. The wound is stable. Apligraf was applied today. She has been decreasing her smoking, only had 4 cigarettes yesterday and 1 today. She continues being more compliant in diabetic diet. She will follow-up next week for evaluation of site, if stable will remove at 2 weeks. 06/28/18- She is here in follow up evalution. Apligraf was placed last week, she states the dressing fell off on Tuesday and she was dressing with hydrofera blue. She is healed and will be discharged from the clinic today. She has been instructed to continue with smoking cessation, continue monitoring glucose levels, offloading for an additional 4 weeks and continue with [name], [name] ([id number]) hydrofera blue for additional two weeks for any possible microscopic opening. Readmission: 08/07/18 on evaluation today patient presents for reevaluation concerning the ulcer of her right great toe.  She was previously discharged on 06/28/18 healed. Nonetheless she states that this began to show signs of drainage she subsequently went to her primary care provider. Subsequently an x-ray was performed on 08/01/18 which was negative. The patient was also placed on antibiotics at  that time. Fortunately they should have been effective for the infection. Nonetheless she's been experiencing some improvement but still has a lot of drainage coming from the wound itself. 08/14/18 on evaluation today patient's wound actually does show signs of improvement in regard to the erythema at this point. She has completed the antibiotics. With that being said we did discuss the possibility of placing her in a total contact cast as of today although I think that I may want to give this just a little bit more time to ensure nothing recurrence as far as her infection is concerned. I do not want to put in the cast and risk infection at that time if things are not completely resolved. With that being said she is gonna require some debridement today. Electronic Signature(s) Signed: 08/16/2018 1:45:00 AM By: Worthy Keeler PA-C Entered By: Worthy Keeler on 08/14/2018 11:15:43 [name] ([id number]) -------------------------------------------------------------------------------- Physical Exam Details Patient Name: Heather Dear A. Date of Service: 08/14/2018 9:30 AM Medical Record Number: [id number] Patient Account Number: [id number] Date of Birth/Sex: June 15, 1976 (42 y.o. F) Treating RN: Montey Hora Primary Care Provider: Priscille Kluver Other Clinician: Referring Provider: Priscille Kluver Treating Provider/Extender: STONE III, [name] Hemberger Weeks in Treatment: 1 Constitutional Well-nourished and well-hydrated in no acute distress. Respiratory normal breathing without difficulty. Psychiatric this patient is able to make decisions and demonstrates good insight into disease process. Alert and Oriented x 3. pleasant and cooperative. Notes Patient's wound bed currently shows evidence of good granulation in the base of the wound although there is some evidence of slough as well as callous. Nonetheless she does require sharp debridement today which was performed without pain or complication.  Post debridement the wound bed appears to be doing significantly better. Electronic Signature(s) Signed: 08/16/2018 1:45:00 AM By: Worthy Keeler PA-C Entered By: Worthy Keeler on 08/14/2018 11:16:13 [name] ([id number]) -------------------------------------------------------------------------------- Physician Orders Details Patient Name: Heather Dear A. Date of Service: 08/14/2018 9:30 AM Medical Record Number: [id number] Patient Account Number: [id number] Date of Birth/Sex: [birth year] (42 y.o. F) Treating RN: Cornell Barman Primary Care Provider: Priscille Kluver Other Clinician: Referring Provider: Priscille Kluver Treating Provider/Extender: Melburn Hake, Macdonald Rigor Weeks in Treatment: 1 Verbal / Phone Orders: No Diagnosis Coding ICD-10 Coding Code Description E11.621 Type 2 diabetes mellitus with foot ulcer L03.032 Cellulitis of left toe L97.522 Non-pressure chronic ulcer of other part of left foot with fat layer exposed Wound Cleansing Wound #4 Left Toe Great o Clean wound with Normal Saline. Anesthetic (add to Medication List) Wound #4 Left Toe Great o Topical Lidocaine 4% cream applied to wound bed prior to debridement (In Clinic Only). Primary Wound Dressing Wound #4 Left Toe Great o Silver Collagen Secondary Dressing Wound #4 Left Toe Great o Conform/Kerlix o Drawtex Dressing Change Frequency Wound #4 Left Toe Great o Change dressing every other day. Follow-up Appointments Wound #4 Left Toe Great o Return Appointment in 1 week. Electronic Signature(s) Signed: 08/14/2018 4:14:49 PM By: Gretta Cool, BSN, RN, CWS, [name] RN, BSN Signed: 08/16/2018 1:45:00 AM By: Worthy Keeler PA-C Entered By: Gretta Cool BSN, RN, CWS, [name] on 08/14/2018 10:09:58 [name] ([id number]) -------------------------------------------------------------------------------- Problem List Details Patient Name: Heather Dear A. Date of Service: 08/14/2018 9:30 AM Medical Record  Number:  [id number] Patient Account Number: [id number] Date of Birth/Sex: December 20, 1975 (42 y.o. F) Treating RN: Montey Hora Primary Care Provider: Priscille Kluver Other Clinician: Referring Provider: Priscille Kluver Treating Provider/Extender: Melburn Hake, Glora Hulgan Weeks in Treatment: 1 Active Problems ICD-10 Evaluated Encounter Code Description Active Date Today Diagnosis E11.621 Type 2 diabetes mellitus with foot ulcer 08/07/2018 No Yes L03.032 Cellulitis of left toe 08/07/2018 No Yes L97.522 Non-pressure chronic ulcer of other part of left foot with fat 08/08/2018 No Yes layer exposed Inactive Problems Resolved Problems Electronic Signature(s) Signed: 08/16/2018 1:45:00 AM By: Worthy Keeler PA-C Entered By: Worthy Keeler on 08/14/2018 10:02:21 Mischke, Dian A. ([id number]) -------------------------------------------------------------------------------- Progress Note Details Patient Name: Heather Dear A. Date of Service: 08/14/2018 9:30 AM Medical Record Number: [id number] Patient Account Number: [id number] Date of Birth/Sex: 05/17/1976 (41 y.o. F) Treating RN: Montey Hora Primary Care Provider: Priscille Kluver Other Clinician: Referring Provider: Priscille Kluver Treating Provider/Extender: Melburn Hake, Ellington Greenslade Weeks in Treatment: 1 Subjective Chief Complaint Information obtained from Patient She is here for evaluation of a left great toe ulcer History of Present Illness (HPI) 01/18/18-She is here for initial evaluation of the left great toe ulcer. She is a poor historian in regards to timeframe in detail. She states approximately 4 weeks ago she lacerated her toe on something in the house. She followed up with her primary care who placed her on Bactrim and ultimately a second dose of Bactrim prior to coming to wound clinic. She states she has been treating the toe with peroxide, Betadine and a Band-Aid. She did not check her blood sugar this morning but checked it yesterday morning it was 327; she  is unaware of a recent A1c and there are no current records. She saw Dr. she would've orthopedics last week for an old injury to the left ankle, she states he did not see her toe, nor did she bring it to his attention. She smokes approximately 1 pack cigarettes a day. Her social situation is concerning, she arrives this morning with her mother who appears extremely intoxicated/under the influence; her mother was asked to leave the room and be monitored by the patient's grandmother. The patient's aunt then accompanied the patient and the room throughout the rest of the appointment. We had a lengthy discussion regarding the deleterious effects of uncontrolled hyperglycemia and smoking as it relates to wound healing and overall health. She was strongly encouraged to decrease her smoking and get her diabetes under better control. She states she is currently on a diet and has cut down her [facility] consumption. The left toe is erythematous, macerated and slightly edematous with malodor present. The edema in her left foot is below her baseline, there is no erythema streaking. We will treat her with Santyl, doxycycline; we have ordered and xray, culture and provided a Peg assist surgical shoe and cultured the wound. 01/25/18-She is here in follow-up evaluation for a left great toe ulcer and presents with an abscess to her suprapubic area. She states her blood sugars remain elevated, feeling "sick" and if levels are below 250, but she is trying. She has made no attempt to decrease her smoking stating that we "can't take away her food in her cigarettes". She has been compliant with offloading using the PEG assist you. She is using Santyl daily. the culture obtained last week grew staph aureus and Enterococcus faecalis; continues on the doxycycline and Augmentin was added on Monday. The suprapubic area has erythema, no femoral variation, purple discoloration, minimal induration,  was accessed with a cotton tip  applicator with sanguinopurulent drainage, this was cultured, I suspect the current antibiotic treatment will cover and we will not add anything to her current treatment plan. She was advised to go to urgent care or ER with any change in redness, induration or fever. 02/01/18-She is here in follow-up evaluation for left great toe ulcers and a new abdominal abscess from last week. She was able to use packing until earlier this week, where she "forgot it was there". She states she was feeling ill with GI symptoms last week and was not taking her antibiotic. She states her glucose levels have been predominantly less than 200, with occasional levels between 200-250. She thinks this was contributing to her GI symptoms as they have resolved without intervention. There continues to be significant laceration to left toe, otherwise it clinically looks stable/improved. There is now less superficial opening to the lateral aspect of the great toe that was residual blister. We will transition to [facility] to all wounds, she will continue her Augmentin. If there is no change or deterioration next week for reculture. 02/08/18-She is here in follow-up evaluation for left great toe ulcer and abdominal ulcer. There is an improvement in both wounds. She has been wrapping her left toe with coban, not by our direction, which has created an area of discoloration to the medial aspect; she has been advised to NOT use coban secondary to her neuropathy. She states her glucose levels have been high over this last week ranging from 200-350, she continues to smoke. She admits to being less compliant with her offloading shoe. We will continue with same treatment plan and she will follow-up next week. 02/15/18-She is here in follow-up evaluation for left great toe ulcer and abdominal ulcer. The abdominal ulcer is epithelialized. The left great toe ulcer is improved and all injury from last week using the Coban wrap is resolved,  the lateral ulcer is healed. She admits to noncompliance with wearing offloading shoe and admits to glucose levels being greater than 300 most of the week. She continues to smoke and expresses no desire to quit. There is one area medially that probes deeper than it has historically, erythema to the toe and dorsal foot has consistently waxed and waned. There is no overt signs of cellulitis or infection but we will culture the wound for any occult infection given the new area of depth and erythema. We will hold off on Tempesta, Khrystyne A. ([id number]) sensitivities for initiation of antibiotic therapy. 02/22/18-She is here in follow up evaluation for left great toe ulcer. There is overall significant improvement in both wound appearance, erythema and edema with changes made last week. She was not initiated on antibiotic therapy. Culture obtained last week showed oxacillin sensitive staph aureus, sensitive to clindamycin. Clindamycin has been called into the pharmacy but she has been instructed to hold off on initiation secondary to overall clinical improvement and her history of antibiotic intolerance. She has been instructed to contact the clinic with any noted changes/deterioration and the wound, erythema, edema and/or pain. She will follow-up next week. She continues to smoke and her glucose levels remain elevated >250; she admits to compliance with offloading shoe 03/01/18 on evaluation today patient appears to be doing fairly well in regard to her left first toe ulcer. She has been tolerating the dressing changes with the [facility] Dressing without complication and overall this has definitely showed signs of improvement according to records as well is what the patient  tells me today. I'm very pleased in that regard. She is having no pain today 03/08/18 She is here for follow up evaluation of a left great toe ulcer. She remains non-compliant with glucose control and smoking cessation; glucose levels  consistently >200. She states that she got new shoe inserts/peg assist. She admits to compliance with offloading. Since my last evaluation there is significant improvement. We will switch to prisma at this time and she will follow up next week. She is noted to be tachycardic at this appointment, heart rate 120s; she has a history of heart rate 70-130 according to our records. She admits to extreme agitation r/t personal issues; she was advised to monitor her heartrate and contact her physician if it does not return to a more normal range (<100). She takes cardizem twice daily. 03/15/18-She is here in follow-up evaluation for left great toe ulcer. She remains noncompliant with glucose control and smoking cessation. She admits to compliance with wearing offloading shoe. The ulcer is improved/stable and we will continue with the same treatment plan and she will follow-up next week 03/22/18-She is here for evaluation for left great toe ulcer. There continues to be significant improvement despite recurrent hyperglycemia (over 500 yesterday) and she continues to smoke. She has been compliant with offloading and we will continue with same treatment plan and she will follow-up next week. 03/29/18-She is here for evaluation for left great toe ulcer. Despite continuing to smoke and uncontrolled diabetes she continues to improve. She is compliant with offloading shoe. We will continue with the same treatment plan and she will follow-up next week 04/05/18- She is here in follow up evaluation for a left great toe ulcer; she presents with small pustule to left fifth toe (resembles ant bite). She admits to compliance with wearing offloading shoe; continues to smoke or have uncontrolled blood glucose control. There is more callus than usual with evidence of bleeding; she denies known trauma. 04/12/18-She is here for evaluation of left great toe ulcer. Despite noncompliance with glycemic control and smoking she continues  to make improvement. She continues to wear offloading shoe. The pustule, that was identified last week, to the left fifth toe is resolved. She will follow-up in 2 weeks 05/03/18-she is seen in follow-up evaluation for a left great toe ulcer. She is compliant with offloading, otherwise noncompliant with glycemic control and smoking. She has plateaued and there is minimal improvement noted. We will transition to [facility], replaced the insert to her surgical shoe and she will follow-up in one week 05/10/18- She is here in follow up evaluation for a left great toe ulcer. It appears stable despite measurement change. We will continue with same treatment plan and follow up next week. 05/24/18-She is seen in follow-up evaluation for a left great toe ulcer. She remains compliant with offloading, has made significant improvement in her diet, decreasing the amount of sugar/soda. She said her recent A1c was 10.9 which is lower than. She did see a diabetic nutritionist/educator yesterday. She continues to smoke. We will continue with the same treatment plan and she'll follow-up next week. 05/31/18- She is seen in follow-up evaluation for left great toe ulcer. She continues to remain compliant with offloading, continues to make improvement in her diet, increasing her water and decreasing the amount of sugar/soda. She does continue to smoke with no desire to quit. We will apply Prisma to the depth and Hydrofera Blue over. We have not received insurance authorization for oasis. She will follow up next week.  06/07/18-She is seen in follow-up evaluation for left great toe ulcer. It has stalled according to today's measurements although base appears stable. She says she saw a diabetic educator yesterday; her average blood sugars are less than 300 which is an improvement for her. She continues to smoke and states "that's my next step" She continues with water over soda. We will order for xray, culture and reinstate  ace wrap compression prior to placing apligraf for next week. She is voicing no complaints or concerns. Her dressing will change to iodoflex over the next week in preparation for apligraf. 06/14/18-She is seen in follow-up evaluation for left great toe ulcer. Plain film x-ray performed last week was negative for osteomyelitis. Wound culture obtained last week grew strep B and OSSA; she is initiated on keflex and cefdinir today; there is erythema to the toe which could be from ace wrap compression, she has a history of wrapping too tight and has has been encouraged to maintain ace wraps that we place today. We will hold off on application of apligraf today, will apply next week after antibiotic therapy has been initiated. She admits today that she has resumed taking a shower with her foot/toe submerged in water, she has been reminded to keep foot/toe out of the bath water. She will be seen in follow up next week 06/21/18-she is seen in follow-up evaluation for left great toe ulcer. She is tolerating antibiotic therapy with no GI disturbance. The wound is stable. Apligraf was applied today. She has been decreasing her smoking, only had 4 cigarettes yesterday and [name], [name] ([id number]) 1 today. She continues being more compliant in diabetic diet. She will follow-up next week for evaluation of site, if stable will remove at 2 weeks. 06/28/18- She is here in follow up evalution. Apligraf was placed last week, she states the dressing fell off on Tuesday and she was dressing with hydrofera blue. She is healed and will be discharged from the clinic today. She has been instructed to continue with smoking cessation, continue monitoring glucose levels, offloading for an additional 4 weeks and continue with hydrofera blue for additional two weeks for any possible microscopic opening. Readmission: 08/07/18 on evaluation today patient presents for reevaluation concerning the ulcer of her right great toe. She  was previously discharged on 06/28/18 healed. Nonetheless she states that this began to show signs of drainage she subsequently went to her primary care provider. Subsequently an x-ray was performed on 08/01/18 which was negative. The patient was also placed on antibiotics at that time. Fortunately they should have been effective for the infection. Nonetheless she's been experiencing some improvement but still has a lot of drainage coming from the wound itself. 08/14/18 on evaluation today patient's wound actually does show signs of improvement in regard to the erythema at this point. She has completed the antibiotics. With that being said we did discuss the possibility of placing her in a total contact cast as of today although I think that I may want to give this just a little bit more time to ensure nothing recurrence as far as her infection is concerned. I do not want to put in the cast and risk infection at that time if things are not completely resolved. With that being said she is gonna require some debridement today. Patient History Information obtained from Patient. Family History Cancer - Paternal Grandparents, Heart Disease - Paternal Grandparents, Hypertension - Paternal Grandparents, No family history of Diabetes, Hereditary Spherocytosis, Kidney Disease, Lung Disease, Seizures, Stroke,  Thyroid Problems, Tuberculosis. Social History Current every day smoker, Marital Status - Single, Alcohol Use - Never, Drug Use - Prior History, Caffeine Use - Daily. Review of Systems (ROS) Constitutional Symptoms (General Health) Denies complaints or symptoms of Fever, Chills. Respiratory The patient has no complaints or symptoms. Cardiovascular The patient has no complaints or symptoms. Psychiatric The patient has no complaints or symptoms. Objective Constitutional Well-nourished and well-hydrated in no acute distress. Vitals Time Taken: 9:32 AM, Height: 69 in, Weight: 240 lbs, BMI: 35.4,  Temperature: 98.2 F, Pulse: 94 bpm, Respiratory Rate: 16 breaths/min, Blood Pressure: 107/70 mmHg. Ahr, [place] ([id number]) Respiratory normal breathing without difficulty. Psychiatric this patient is able to make decisions and demonstrates good insight into disease process. Alert and Oriented x 3. pleasant and cooperative. General Notes: Patient's wound bed currently shows evidence of good granulation in the base of the wound although there is some evidence of slough as well as callous. Nonetheless she does require sharp debridement today which was performed without pain or complication. Post debridement the wound bed appears to be doing significantly better. Integumentary (Hair, Skin) Wound #4 status is Open. Original cause of wound was Gradually Appeared. The wound is located on the Left Toe Great. The wound measures 1cm length x 0.3cm width x 0.4cm depth; 0.236cm^2 area and 0.094cm^3 volume. There is Fat Layer (Subcutaneous Tissue) Exposed exposed. There is undermining starting at 9:00 and ending at 3:00 with a maximum distance of 0.6cm. There is a medium amount of serous drainage noted. The wound margin is flat and intact. There is small (1-33%) granulation within the wound bed. There is a medium (34-66%) amount of necrotic tissue within the wound bed including Eschar. The periwound skin appearance exhibited: Callus. The periwound skin appearance did not exhibit: Crepitus, Excoriation, Induration, Rash, Scarring, Dry/Scaly, Maceration, Atrophie Blanche, Cyanosis, Ecchymosis, Hemosiderin Staining, Mottled, Pallor, Rubor, Erythema. Periwound temperature was noted as Hot. Assessment Active Problems ICD-10 Type 2 diabetes mellitus with foot ulcer Cellulitis of left toe Non-pressure chronic ulcer of other part of left foot with fat layer exposed Procedures Wound #4 Pre-procedure diagnosis of Wound #4 is a Diabetic Wound/Ulcer of the Lower Extremity located on the Left Toe Great  .Severity of Tissue Pre Debridement is: Fat layer exposed. There was a Excisional Skin/Subcutaneous Tissue Debridement with a total area of 0.3 sq cm performed by STONE III, Daryle Amis E., PA-C. With the following instrument(s): Curette to remove Viable and Non-Viable tissue/material. Material removed includes Callus and Subcutaneous Tissue and after achieving pain control using Lidocaine. No specimens were taken. A time out was conducted at 10:04, prior to the start of the procedure. A Moderate amount of bleeding was controlled with Pressure. The procedure was tolerated well with a pain level of 0 throughout and a pain level of 0 following the procedure. Post Debridement Measurements: 1cm length x 0.5cm width x 0.6cm depth; 0.236cm^3 volume. Character of Wound/Ulcer Post Debridement requires further debridement. Severity of Tissue Post Debridement is: Fat layer exposed. Post procedure Diagnosis Wound #4: Same as Pre-Procedure [name], [name] ([id number]) Plan Wound Cleansing: Wound #4 Left Toe Great: Clean wound with Normal Saline. Anesthetic (add to Medication List): Wound #4 Left Toe Great: Topical Lidocaine 4% cream applied to wound bed prior to debridement (In Clinic Only). Primary Wound Dressing: Wound #4 Left Toe Great: Silver Collagen Secondary Dressing: Wound #4 Left Toe Great: Conform/Kerlix Drawtex Dressing Change Frequency: Wound #4 Left Toe Great: Change dressing every other day. Follow-up Appointments: Wound #4 Left Toe Great:  Return Appointment in 1 week. I'm gonna suggest at this point that we initiating continue with the above wound care measures for the next week. Patient is in agreement the plan. Assuming there's no erythema or issue that arises between now and then we will definitely consider the total contact cast at that point. Otherwise it's a pleasure seeing her today I'm glad she is looking better and hopefully this will continue to show signs of  improvement. Please see above for specific wound care orders. We will see patient for re-evaluation in 1 week(s) here in the clinic. If anything worsens or changes patient will contact our office for additional recommendations. Electronic Signature(s) Signed: 08/16/2018 1:45:00 AM By: Worthy Keeler PA-C Entered By: Worthy Keeler on 08/14/2018 11:16:48 [name] ([id number]) -------------------------------------------------------------------------------- ROS/PFSH Details Patient Name: Heather Dear A. Date of Service: 08/14/2018 9:30 AM Medical Record Number: [id number] Patient Account Number: [id number] Date of Birth/Sex: Mar 03, 1976 (42 y.o. F) Treating RN: Montey Hora Primary Care Provider: Priscille Kluver Other Clinician: Referring Provider: Priscille Kluver Treating Provider/Extender: STONE III, Kitty Cadavid Weeks in Treatment: 1 Information Obtained From Patient Wound History Do you currently have one or more open woundso Yes How many open wounds do you currently haveo 1 Approximately how long have you had your woundso 1 How have you been treating your wound(s) until nowo murpiricin Has your wound(s) ever healed and then re-openedo Yes Have you had any lab work done in the past montho Yes Have you tested positive for an antibiotic resistant organism (MRSA, VRE)o Yes Have you tested positive for osteomyelitis (bone infection)o No Have you had any tests for circulation on your legso No Constitutional Symptoms (General Health) Complaints and Symptoms: Negative for: Fever; Chills Eyes Medical History: Negative for: Cataracts; Glaucoma; Optic Neuritis Ear/Nose/Mouth/Throat Medical History: Negative for: Chronic sinus problems/congestion; Middle ear problems Hematologic/Lymphatic Medical History: Negative for: Anemia; Hemophilia; Human Immunodeficiency Virus; Lymphedema; Sickle Cell Disease Respiratory Complaints and Symptoms: No Complaints or Symptoms Medical  History: Positive for: Chronic Obstructive Pulmonary Disease (COPD) Negative for: Aspiration; Asthma; Pneumothorax; Sleep Apnea; Tuberculosis Cardiovascular Complaints and Symptoms: No Complaints or Symptoms Medical History: Positive for: Arrhythmia Negative for: Angina; Congestive Heart Failure; Coronary Artery Disease; Deep Vein Thrombosis; Hypertension; Wagenaar, [name] ([id number]) Hypotension; Myocardial Infarction; Peripheral Arterial Disease; Peripheral Venous Disease; Phlebitis; Vasculitis Gastrointestinal Medical History: Negative for: Cirrhosis ; Colitis; Crohnos; Hepatitis A; Hepatitis B; Hepatitis C Endocrine Medical History: Positive for: Type II Diabetes Negative for: Type I Diabetes Time with diabetes: 10 years Treated with: Insulin, Oral agents Blood sugar tested every day: Yes Tested : 2 time daily Blood sugar testing results: Breakfast: 250 Genitourinary Medical History: Negative for: End Stage Renal Disease Immunological Medical History: Negative for: Lupus Erythematosus; Raynaudos; Scleroderma Integumentary (Skin) Medical History: Negative for: History of Burn; History of pressure wounds Musculoskeletal Medical History: Negative for: Gout; Rheumatoid Arthritis; Osteoarthritis; Osteomyelitis Neurologic Medical History: Positive for: Neuropathy - legs and feet; Seizure Disorder Oncologic Medical History: Negative for: Received Chemotherapy; Received Radiation Psychiatric Complaints and Symptoms: No Complaints or Symptoms Medical History: Positive for: Confinement Anxiety Negative for: Anorexia/bulimia Immunizations [name], [name] ([id number]) Pneumococcal Vaccine: Received Pneumococcal Vaccination: Yes Immunization Notes: up to date Implantable Devices Family and Social History Cancer: Yes - Paternal Grandparents; Diabetes: No; Heart Disease: Yes - Paternal Grandparents; Hereditary Spherocytosis: No; Hypertension: Yes - Paternal  Grandparents; Kidney Disease: No; Lung Disease: No; Seizures: No; Stroke: No; Thyroid Problems: No; Tuberculosis: No; Current every day smoker; Marital Status - Single; Alcohol Use:  Never; Drug Use: Prior History; Caffeine Use: Daily; Financial Concerns: No; Food, Clothing or Shelter Needs: No; Support System Lacking: No; Transportation Concerns: No; Advanced Directives: No; Patient does not want information on Advanced Directives; Living Will: No; Medical Power of Attorney: No Physician Affirmation I have reviewed and agree with the above information. Electronic Signature(s) Signed: 08/14/2018 5:03:55 PM By: Montey Hora Signed: 08/16/2018 1:45:00 AM By: Worthy Keeler PA-C Entered By: Worthy Keeler on 08/14/2018 11:16:02 [name], [name] Kitchen ([id number]) -------------------------------------------------------------------------------- SuperBill Details Patient Name: Heather Dear A. Date of Service: 08/14/2018 Medical Record Number: [id number] Patient Account Number: [id number] Date of Birth/Sex: [birth year] (42 y.o. F) Treating RN: Montey Hora Primary Care Provider: Priscille Kluver Other Clinician: Referring Provider: Priscille Kluver Treating Provider/Extender: Melburn Hake, Percy Comp Weeks in Treatment: 1 Diagnosis Coding ICD-10 Codes Code Description E11.621 Type 2 diabetes mellitus with foot ulcer L03.032 Cellulitis of left toe L97.522 Non-pressure chronic ulcer of other part of left foot with fat layer exposed Facility Procedures CPT4 Code: [id number] Description: 73736 - DEB SUBQ TISSUE 20 SQ CM/< ICD-10 Diagnosis Description L97.522 Non-pressure chronic ulcer of other part of left foot with fat Modifier: layer exposed Quantity: 1 Physician Procedures CPT4 Code: [id number] Description: 07615 - WC PHYS SUBQ TISS 20 SQ CM ICD-10 Diagnosis Description L97.522 Non-pressure chronic ulcer of other part of left foot with fat Modifier: layer exposed Quantity: 1 Electronic Signature(s) Signed:  08/16/2018 1:45:00 AM By: Worthy Keeler PA-C Entered By: Worthy Keeler on 08/14/2018 11:16:54

## 2018-08-17 NOTE — Progress Notes (Signed)
[name], [name] ([id number]) Visit Report for 08/14/2018 Arrival Information Details Patient Name: [name], [name] Date of Service: 08/14/2018 9:30 AM Medical Record Number: [id number] Patient Account Number: [id number] Date of Birth/Sex: [birth year] (42 y.o. F) Treating RN: Montey Hora Primary Care Estill Llerena: Priscille Kluver Other Clinician: Referring Karina Nofsinger: Priscille Kluver Treating Chirstopher Iovino/Extender: Melburn Hake, HOYT Weeks in Treatment: 1 Visit Information History Since Last Visit Added or deleted any medications: No Patient Arrived: Wheel Chair Any new allergies or adverse reactions: No Arrival Time: 09:29 Had a fall or experienced change in No Accompanied By: father activities of daily living that may affect Transfer Assistance: None risk of falls: Patient Identification Verified: Yes Signs or symptoms of abuse/neglect since last visito No Secondary Verification Process Completed: Yes Hospitalized since last visit: No Implantable device outside of the clinic excluding No cellular tissue based products placed in the center since last visit: Has Dressing in Place as Prescribed: Yes Pain Present Now: No Electronic Signature(s) Signed: 08/14/2018 2:02:18 PM By: Lorine Bears RCP, RRT, CHT Entered By: Lorine Bears on 08/14/2018 09:32:23 [name] ([id number]) -------------------------------------------------------------------------------- Encounter Discharge Information Details Patient Name: Heather Dear A. Date of Service: 08/14/2018 9:30 AM Medical Record Number: [id number] Patient Account Number: [id number] Date of Birth/Sex: [birth year] (42 y.o. F) Treating RN: Cornell Barman Primary Care Irma Delancey: Priscille Kluver Other Clinician: Referring Rilea Arutyunyan: Priscille Kluver Treating Bridgit Eynon/Extender: Melburn Hake, HOYT Weeks in Treatment: 1 Encounter Discharge Information Items Discharge Condition: Stable Ambulatory Status: Wheelchair Discharge  Destination: Home Transportation: Private Auto Accompanied By: step-father Schedule Follow-up Appointment: Yes Clinical Summary of Care: Post Procedure Vitals: Temperature (F): 98.2 Pulse (bpm): 94 Respiratory Rate (breaths/min): 16 Blood Pressure (mmHg): 107/70 Electronic Signature(s) Signed: 08/14/2018 4:14:49 PM By: Gretta Cool, BSN, RN, CWS, [name] RN, BSN Entered By: Gretta Cool, BSN, RN, CWS, [name] on 08/14/2018 10:17:15 [name] ([id number]) -------------------------------------------------------------------------------- Lower Extremity Assessment Details Patient Name: Heather Dear A. Date of Service: 08/14/2018 9:30 AM Medical Record Number: [id number] Patient Account Number: [id number] Date of Birth/Sex: September 11, 1976 (42 y.o. F) Treating RN: Cornell Barman Primary Care Corneilus Heggie: Priscille Kluver Other Clinician: Referring Nancyann Cotterman: Priscille Kluver Treating Ugonna Keirsey/Extender: STONE III, HOYT Weeks in Treatment: 1 Edema Assessment Assessed: [Left: No] [Right: No] Edema: [Left: N] [Right: o] Vascular Assessment Claudication: Claudication Assessment [Left:None] Pulses: Dorsalis Pedis Palpable: [Left:Yes] Posterior Tibial Extremity colors, hair growth, and conditions: Extremity Color: [Left:Normal] Hair Growth on Extremity: [Left:Yes] Temperature of Extremity: [Left:Warm] Capillary Refill: [Left:< 3 seconds] Toe Nail Assessment Left: Right: Thick: No Discolored: No Deformed: No Improper Length and Hygiene: No Electronic Signature(s) Signed: 08/14/2018 4:14:49 PM By: Gretta Cool, BSN, RN, CWS, [name] RN, BSN Entered By: Gretta Cool, BSN, RN, CWS, [name] on 08/14/2018 09:53:07 [name] ([id number]) -------------------------------------------------------------------------------- Multi Wound Chart Details Patient Name: Heather Dear A. Date of Service: 08/14/2018 9:30 AM Medical Record Number: [id number] Patient Account Number: [id number] Date of Birth/Sex: 01-28-1976 (42 y.o. 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] [N/A:N/A] Comorbid History: [4:Chronic Obstructive Pulmonary Disease (COPD), Arrhythmia, Type II Diabetes, Neuropathy, Seizure Disorder, Confinement Anxiety] [N/A:N/A] Date Acquired: [4:07/24/2018] [N/A:N/A] Weeks of Treatment: [4:1] [N/A:N/A] Wound Status: [4:Open] [N/A:N/A] Pending Amputation on [4:Yes] [N/A:N/A]  Presentation: Measurements L x W x D [4:1x0.3x0.4] [N/A:N/A] (cm) Area (cm) : [4:0.236] [N/A:N/A] Volume (cm) : [4:0.094] [N/A:N/A] % Reduction in Area: [4:84.80%] [N/A:N/A] % Reduction in Volume: [4:39.70%] [N/A:N/A] Starting Position 1 [4:9] (o'clock): Ending Position 1 [4:3] (o'clock): Maximum Distance 1 (cm): [4:0.6] Undermining: [4:Yes] [N/A:N/A] Classification: [4:Grade 1] [N/A:N/A] Exudate Amount: [4:Medium] [N/A:N/A] Exudate Type: [4:Serous] [N/A:N/A] Exudate Color: [4:amber] [N/A:N/A] Wound Margin: [4:Flat and Intact] [N/A:N/A] Granulation Amount: [4:Small (1-33%)] [N/A:N/A] Necrotic Amount: [4:Medium (34-66%)] [N/A:N/A] Necrotic Tissue: [4:Eschar] [N/A:N/A] Exposed Structures: [4:Fat Layer (Subcutaneous Tissue) Exposed: Yes] [N/A:N/A] Fascia: No Tendon: No Muscle: No Joint: No Bone: No Epithelialization: None N/A N/A Periwound Skin Texture: Callus: Yes N/A N/A Excoriation: No Induration: No Crepitus: No Rash: No Scarring: No Periwound Skin Moisture: Maceration: No N/A N/A Dry/Scaly: No Periwound Skin  Color: Atrophie Blanche: No N/A N/A Cyanosis: No Ecchymosis: No Erythema: No Hemosiderin Staining: No Mottled: No Pallor: No Rubor: No Temperature: Hot N/A N/A Tenderness on Palpation: No N/A N/A Wound Preparation: Ulcer Cleansing: N/A N/A Rinsed/Irrigated with Saline Topical Anesthetic Applied: Other: lidocaine 4% Treatment Notes Electronic Signature(s) Signed: 08/14/2018 4:14:49 PM By: Gretta Cool, BSN, RN, CWS, [name] RN, BSN Entered By: Gretta Cool, BSN, RN, CWS, [name] on 08/14/2018 10:05:40 [name] ([id number]) -------------------------------------------------------------------------------- [place] Details Patient Name: Heather Dear A. Date of Service: 08/14/2018 9:30 AM Medical Record Number: [id number] Patient Account Number: [id number] Date of Birth/Sex: 03-14-1976 (42 y.o. F) Treating RN: Cornell Barman Primary Care Janene Yousuf: Priscille Kluver Other Clinician: Referring Sadae Arrazola: Priscille Kluver Treating Marquie Aderhold/Extender: Melburn Hake, HOYT Weeks in Treatment: 1 Active Inactive ` Abuse / Safety / Falls / Self Care Management Nursing Diagnoses: History of Falls Goals: Patient will not experience any injury related to falls Date Initiated: 08/07/2018 Target Resolution Date: 10/19/2018 Goal Status: Active Interventions: Assess fall risk on admission and as needed Notes: ` Orientation to the Wound Care Program Nursing Diagnoses: Knowledge deficit related to the wound healing center program Goals: Patient/caregiver will verbalize understanding of the [place] Date Initiated: 08/07/2018 Target Resolution Date: 10/19/2018 Goal Status: Active Interventions: Provide education on orientation to the wound center Notes: ` Soft Tissue Infection Nursing Diagnoses: Impaired tissue integrity Goals: Patient will remain free of wound infection Date Initiated: 08/07/2018 Target Resolution Date: 10/19/2018 Goal Status:  Active Interventions: Heiny, [name] ([id number]) Assess signs and symptoms of infection every visit Notes: ` Wound/Skin Impairment Nursing Diagnoses: Impaired tissue integrity Goals: Ulcer/skin breakdown will heal within 14 weeks Date Initiated: 08/07/2018 Target Resolution Date: 10/19/2018 Goal Status: Active Interventions: Assess patient/caregiver ability to obtain necessary supplies Notes: Electronic Signature(s) Signed: 08/14/2018 4:14:49 PM By: Gretta Cool, BSN, RN, CWS, [name] RN, BSN Entered By: Gretta Cool, BSN, RN, CWS, [name] on 08/14/2018 10:05:33 [name] ([id number]) -------------------------------------------------------------------------------- Pain Assessment Details Patient Name: Heather Dear A. Date of Service: 08/14/2018 9:30 AM Medical Record Number: [id number] Patient Account Number: [id number] Date of Birth/Sex: Mar 15, 1976 (42 y.o. F) Treating RN: Montey Hora Primary Care Palmer Fahrner: Priscille Kluver Other Clinician: Referring Jesua Tamblyn: Priscille Kluver Treating Daneen Volcy/Extender: STONE III, HOYT Weeks in Treatment: 1 Active Problems Location of Pain Severity and Description of Pain Patient Has Paino No Site Locations Pain Management and Medication Current Pain Management: Electronic Signature(s) Signed: 08/14/2018 2:02:18 PM By: Lorine Bears RCP, RRT, CHT Signed: 08/14/2018 5:03:55 PM By: Montey Hora Entered By: Lorine Bears on 08/14/2018 09:32:31 [name] ([id number]) -------------------------------------------------------------------------------- Patient/Caregiver Education Details Patient Name: Heather Dear A. Date of Service: 08/14/2018 9:30 AM Medical Record Number: [id number] Patient Account  Number: [id number] Date of Birth/Gender: August 22, 1976 (42 y.o. F) Treating RN: Cornell Barman Primary Care Physician: Priscille Kluver Other Clinician: Referring Physician: Priscille Kluver Treating Physician/Extender: Sharalyn Ink in Treatment: 1 Education Assessment Education Provided To: Patient Education Topics Provided Offloading: Handouts: What is Offloadingo, Other: DArco shoe with peg assist Methods: Explain/Verbal Responses: State content correctly Wound/Skin Impairment: Handouts: Caring for Your Ulcer Methods: Demonstration, Explain/Verbal Responses: State content correctly Electronic Signature(s) Signed: 08/14/2018 4:14:49 PM By: Gretta Cool, BSN, RN, CWS, [name] RN, BSN Entered By: Gretta Cool, BSN, RN, CWS, [name] on 08/14/2018 10:11:52 [name] ([id number]) -------------------------------------------------------------------------------- Wound Assessment Details Patient Name: Heather Dear A. Date of Service: 08/14/2018 9:30 AM Medical Record Number: [id number] Patient Account Number: [id number] Date of Birth/Sex: 1976/10/23 (42 y.o. F) Treating RN: Cornell Barman Primary Care Venita Seng: Priscille Kluver Other Clinician: Referring Bryahna Lesko: Priscille Kluver Treating Benedict Kue/Extender: STONE III, HOYT Weeks in Treatment: 1 Wound Status Wound Number: 4 Primary Diabetic Wound/Ulcer of the Lower Extremity Etiology: Wound Location: Left Toe Great Wound Open Wounding Event: Gradually Appeared Status: Date Acquired: 07/24/2018 Comorbid Chronic Obstructive Pulmonary Disease Weeks Of Treatment: 1 History: (COPD), Arrhythmia, Type II Diabetes, Clustered Wound: No Neuropathy, Seizure Disorder, Confinement Pending Amputation On Presentation Anxiety Photos Wound Measurements Length: (cm) 1 % Reduction in Width: (cm) 0.3 % Reduction in Depth: (cm) 0.4 Epithelializat Area: (cm) 0.236 Undermining: Volume: (cm) 0.094 Starting P Ending Posi Maximum Dis Area: 84.8% Volume: 39.7% ion: None Yes osition (o'clock): 9 tion (o'clock): 3 tance: (cm) 0.6 Wound Description Classification: Grade 1 Foul Odor Aft Wound Margin: Flat and Intact Slough/Fibrin Exudate Amount: Medium Exudate Type:  Serous Exudate Color: amber er Cleansing: No o No Wound Bed Granulation Amount: Small (1-33%) Exposed Structure Necrotic Amount: Medium (34-66%) Fascia Exposed: No Necrotic Quality: Eschar Fat Layer (Subcutaneous Tissue) Exposed: Yes Tendon Exposed: No Muscle Exposed: No Joint Exposed: No Carano, [name] ([id number]) Bone Exposed: No Periwound Skin Texture Texture Color No Abnormalities Noted: No No Abnormalities Noted: No Callus: Yes Atrophie Blanche: No Crepitus: No Cyanosis: No Excoriation: No Ecchymosis: No Induration: No Erythema: No Rash: No Hemosiderin Staining: No Scarring: No Mottled: No Pallor: No Moisture Rubor: No No Abnormalities Noted: No Dry / Scaly: No Temperature / Pain Maceration: No Temperature: Hot Wound Preparation Ulcer Cleansing: Rinsed/Irrigated with Saline Topical Anesthetic Applied: Other: lidocaine 4%, Treatment Notes Wound #4 (Left Toe Great) 1. Cleansed with: Clean wound with Normal Saline 2. Anesthetic Topical Lidocaine 4% cream to wound bed prior to debridement 4. Dressing Applied: Prisma Ag Notes Prisma Ag, Drawtex, coverlet to secure Electronic Signature(s) Signed: 08/15/2018 11:56:07 AM By: Sharon Mt Signed: 08/15/2018 4:45:51 PM By: Gretta Cool, BSN, RN, CWS, [name] RN, BSN Previous Signature: 08/14/2018 4:14:49 PM Version By: Gretta Cool, BSN, RN, CWS, [name] RN, BSN Entered By: Sharon Mt on 08/15/2018 11:26:36 [name] ([id number]) -------------------------------------------------------------------------------- Vitals Details Patient Name: Heather Dear A. Date of Service: 08/14/2018 9:30 AM Medical Record Number: [id number] Patient Account Number: [id number] Date of Birth/Sex: Mar 22, 1976 (41 y.o. F) Treating RN: Montey Hora Primary Care Yuya Vanwingerden: Priscille Kluver Other Clinician: Referring Fran Neiswonger: Priscille Kluver Treating Cherica Heiden/Extender: STONE III, HOYT Weeks in Treatment: 1 Vital Signs Time Taken: 09:32 Temperature  (F): 98.2 Height (in): 69 Pulse (bpm): 94 Weight (lbs): 240 Respiratory Rate (breaths/min): 16 Body Mass Index (BMI): 35.4 Blood Pressure (mmHg): 107/70 Reference Range: 80 - 120 mg / dl Electronic Signature(s) Signed: 08/14/2018 2:02:18 PM By: Lorine Bears RCP, RRT, CHT Entered By: Lorine Bears on 08/14/2018 09:34:43

## 2018-08-21 ENCOUNTER — Encounter: Payer: Medicaid Other | Admitting: Physician Assistant

## 2018-08-21 DIAGNOSIS — E11622 Type 2 diabetes mellitus with other skin ulcer: Secondary | ICD-10-CM | POA: Diagnosis not present

## 2018-08-24 ENCOUNTER — Encounter: Payer: Medicaid Other | Attending: Physician Assistant | Admitting: Physician Assistant

## 2018-08-24 DIAGNOSIS — L97522 Non-pressure chronic ulcer of other part of left foot with fat layer exposed: Secondary | ICD-10-CM | POA: Diagnosis not present

## 2018-08-24 DIAGNOSIS — L03032 Cellulitis of left toe: Secondary | ICD-10-CM | POA: Insufficient documentation

## 2018-08-24 DIAGNOSIS — F17210 Nicotine dependence, cigarettes, uncomplicated: Secondary | ICD-10-CM | POA: Insufficient documentation

## 2018-08-24 DIAGNOSIS — J449 Chronic obstructive pulmonary disease, unspecified: Secondary | ICD-10-CM | POA: Insufficient documentation

## 2018-08-24 DIAGNOSIS — M21372 Foot drop, left foot: Secondary | ICD-10-CM | POA: Insufficient documentation

## 2018-08-24 DIAGNOSIS — F419 Anxiety disorder, unspecified: Secondary | ICD-10-CM | POA: Insufficient documentation

## 2018-08-24 DIAGNOSIS — I499 Cardiac arrhythmia, unspecified: Secondary | ICD-10-CM | POA: Insufficient documentation

## 2018-08-24 DIAGNOSIS — Z8249 Family history of ischemic heart disease and other diseases of the circulatory system: Secondary | ICD-10-CM | POA: Diagnosis not present

## 2018-08-24 DIAGNOSIS — E1140 Type 2 diabetes mellitus with diabetic neuropathy, unspecified: Secondary | ICD-10-CM | POA: Diagnosis not present

## 2018-08-24 DIAGNOSIS — H409 Unspecified glaucoma: Secondary | ICD-10-CM | POA: Insufficient documentation

## 2018-08-24 DIAGNOSIS — S99912A Unspecified injury of left ankle, initial encounter: Secondary | ICD-10-CM | POA: Diagnosis not present

## 2018-08-24 DIAGNOSIS — E11621 Type 2 diabetes mellitus with foot ulcer: Secondary | ICD-10-CM | POA: Diagnosis present

## 2018-08-24 DIAGNOSIS — G40909 Epilepsy, unspecified, not intractable, without status epilepticus: Secondary | ICD-10-CM | POA: Diagnosis not present

## 2018-08-24 DIAGNOSIS — I252 Old myocardial infarction: Secondary | ICD-10-CM | POA: Insufficient documentation

## 2018-08-24 NOTE — Progress Notes (Signed)
DISA, RIEDLINGER ([id number]) Visit Report for 08/21/2018 Chief Complaint Document Details Patient Name: [name], [name] Date of Service: 08/21/2018 12:45 PM Medical Record Number: [id number] Patient Account Number: [id number] Date of Birth/Sex: [birth year] (42 y.o. F) Treating RN: Montey Hora Primary Care Provider: Priscille Kluver Other Clinician: Referring Provider: Priscille Kluver Treating Provider/Extender: Melburn Hake, Aubryanna Nesheim Weeks in Treatment: 2 Information Obtained from: Patient Chief Complaint She is here for evaluation of [name] left great toe ulcer Electronic Signature(s) Signed: 08/22/2018 1:21:52 AM By: Worthy Keeler PA-C Entered By: Worthy Keeler on 08/21/2018 13:14:15 [name] ([id number]) -------------------------------------------------------------------------------- Debridement Details Patient Name: [name] Date of Service: 08/21/2018 12:45 PM Medical Record Number: [id number] Patient Account Number: [id number] Date of Birth/Sex: 07/31/1976 (42 y.o. F) Treating RN: Montey Hora Primary Care Provider: Priscille Kluver Other Clinician: Referring Provider: Priscille Kluver Treating Provider/Extender: STONE III, Myrtice Lowdermilk Weeks in Treatment: 2 Debridement Performed for Wound #4 Left Toe Great Assessment: Performed By: Physician STONE III, Hyden Soley E., PA-C Debridement Type: Debridement Severity of Tissue Pre Fat layer exposed Debridement: Level of Consciousness (Pre- Awake and Alert procedure): Pre-procedure Verification/Time Yes - 13:31 Out Taken: Start Time: 13:31 Pain Control: Lidocaine 4% Topical Solution Total Area Debrided (L x W): 0.5 (cm) x 0.3 (cm) = 0.15 (cm) Tissue and other material Viable, Non-Viable, Callus, Slough, Subcutaneous, Slough debrided: Level: Skin/Subcutaneous Tissue Debridement Description: Excisional Instrument: Curette Bleeding: Minimum Hemostasis Achieved: Pressure End Time: 13:36 Procedural Pain: 0 Post Procedural Pain:  0 Response to Treatment: Procedure was tolerated well Level of Consciousness Awake and Alert (Post-procedure): Post Debridement Measurements of Total Wound Length: (cm) 0.6 Width: (cm) 0.3 Depth: (cm) 0.3 Volume: (cm) 0.042 Character of Wound/Ulcer Post Debridement: Improved Severity of Tissue Post Debridement: Fat layer exposed Post Procedure Diagnosis Same as Pre-procedure Electronic Signature(s) Signed: 08/21/2018 5:02:25 PM By: Montey Hora Signed: 08/22/2018 1:21:52 AM By: Worthy Keeler PA-C Entered By: Montey Hora on 08/21/2018 13:36:23 [name] ([id number]) -------------------------------------------------------------------------------- HPI Details Patient Name: [name] Date of Service: 08/21/2018 12:45 PM Medical Record Number: [id number] Patient Account Number: [id number] Date of Birth/Sex: August 01, 1976 (42 y.o. F) Treating RN: Montey Hora Primary Care Provider: Priscille Kluver Other Clinician: Referring Provider: Priscille Kluver Treating Provider/Extender: Melburn Hake, Addilyn Satterwhite Weeks in Treatment: 2 History of Present Illness HPI Description: 01/18/18-She is here for initial evaluation of the left great toe ulcer. She is [name] poor historian in regards to timeframe in detail. She states approximately 4 weeks ago she lacerated her toe on something in the house. She followed up with her primary care who placed her on Bactrim and ultimately [name] second dose of Bactrim prior to coming to wound clinic. She states she has been treating the toe with peroxide, Betadine and [name] Band-Aid. She did not check her blood sugar this morning but checked it yesterday morning it was 327; she is unaware of [name] recent A1c and there are no current records. She saw Dr. she would've orthopedics last week for an old injury to the left ankle, she states he did not see her toe, nor did she bring it to his attention. She smokes approximately 1 pack cigarettes [name] day. Her social situation is  concerning, she arrives this morning with her mother who appears extremely intoxicated/under the influence; her mother was asked to leave the room and be monitored by the patient's grandmother. The patient's aunt then accompanied the patient and the room throughout the rest of the appointment. We had [name] lengthy discussion regarding the  deleterious effects of uncontrolled hyperglycemia and smoking as it relates to wound healing and overall health. She was strongly encouraged to decrease her smoking and get her diabetes under better control. She states she is currently on [name] diet and has cut down her [facility] consumption. The left toe is erythematous, macerated and slightly edematous with malodor present. The edema in her left foot is below her baseline, there is no erythema streaking. We will treat her with Santyl, doxycycline; we have ordered and xray, culture and provided [name] Peg assist surgical shoe and cultured the wound. 01/25/18-She is here in follow-up evaluation for [name] left great toe ulcer and presents with an abscess to her suprapubic area. She states her blood sugars remain elevated, feeling "sick" and if levels are below 250, but she is trying. She has made no attempt to decrease her smoking stating that we "can't take away her food in her cigarettes". She has been compliant with offloading using the PEG assist you. She is using Santyl daily. the culture obtained last week grew staph aureus and Enterococcus faecalis; continues on the doxycycline and Augmentin was added on Monday. The suprapubic area has erythema, no femoral variation, purple discoloration, minimal induration, was accessed with [name] cotton tip applicator with sanguinopurulent drainage, this was cultured, I suspect the current antibiotic treatment will cover and we will not add anything to her current treatment plan. She was advised to go to urgent care or ER with any change in redness, induration or fever. 02/01/18-She is here in  follow-up evaluation for left great toe ulcers and [name] new abdominal abscess from last week. She was able to use packing until earlier this week, where she "forgot it was there". She states she was feeling ill with GI symptoms last week and was not taking her antibiotic. She states her glucose levels have been predominantly less than 200, with occasional levels between 200-250. She thinks this was contributing to her GI symptoms as they have resolved without intervention. There continues to be significant laceration to left toe, otherwise it clinically looks stable/improved. There is now less superficial opening to the lateral aspect of the great toe that was residual blister. We will transition to [facility] to all wounds, she will continue her Augmentin. If there is no change or deterioration next week for reculture. 02/08/18-She is here in follow-up evaluation for left great toe ulcer and abdominal ulcer. There is an improvement in both wounds. She has been wrapping her left toe with coban, not by our direction, which has created an area of discoloration to the medial aspect; she has been advised to NOT use coban secondary to her neuropathy. She states her glucose levels have been high over this last week ranging from 200-350, she continues to smoke. She admits to being less compliant with her offloading shoe. We will continue with same treatment plan and she will follow-up next week. 02/15/18-She is here in follow-up evaluation for left great toe ulcer and abdominal ulcer. The abdominal ulcer is epithelialized. The left great toe ulcer is improved and all injury from last week using the Coban wrap is resolved, the lateral ulcer is healed. She admits to noncompliance with wearing offloading shoe and admits to glucose levels being greater than 300 most of the week. She continues to smoke and expresses no desire to quit. There is one area medially that probes deeper than it has historically,  erythema to the toe and dorsal foot has consistently waxed and waned. There is no overt  signs of cellulitis or infection but we will culture the wound for any occult infection given the new area of depth and erythema. We will hold off on sensitivities for initiation of antibiotic therapy. 02/22/18-She is here in follow up evaluation for left great toe ulcer. There is overall significant improvement in both wound appearance, erythema and edema with changes made last week. She was not initiated on antibiotic therapy. Culture obtained last week showed oxacillin sensitive staph aureus, sensitive to clindamycin. Clindamycin has been called into the pharmacy but she has been instructed to hold off on initiation secondary to overall clinical improvement and her history of antibiotic intolerance. She has been instructed to contact the clinic with any noted changes/deterioration and the wound, erythema, Wooton, Bill [name]. ([id number]) edema and/or pain. She will follow-up next week. She continues to smoke and her glucose levels remain elevated >250; she admits to compliance with offloading shoe 03/01/18 on evaluation today patient appears to be doing fairly well in regard to her left first toe ulcer. She has been tolerating the dressing changes with the [facility] Dressing without complication and overall this has definitely showed signs of improvement according to records as well is what the patient tells me today. I'm very pleased in that regard. She is having no pain today 03/08/18 She is here for follow up evaluation of [name] left great toe ulcer. She remains non-compliant with glucose control and smoking cessation; glucose levels consistently >200. She states that she got new shoe inserts/peg assist. She admits to compliance with offloading. Since my last evaluation there is significant improvement. We will switch to prisma at this time and she will follow up next week. She is noted to be tachycardic at this  appointment, heart rate 120s; she has [name] history of heart rate 70-130 according to our records. She admits to extreme agitation r/t personal issues; she was advised to monitor her heartrate and contact her physician if it does not return to [name] more normal range (<100). She takes cardizem twice daily. 03/15/18-She is here in follow-up evaluation for left great toe ulcer. She remains noncompliant with glucose control and smoking cessation. She admits to compliance with wearing offloading shoe. The ulcer is improved/stable and we will continue with the same treatment plan and she will follow-up next week 03/22/18-She is here for evaluation for left great toe ulcer. There continues to be significant improvement despite recurrent hyperglycemia (over 500 yesterday) and she continues to smoke. She has been compliant with offloading and we will continue with same treatment plan and she will follow-up next week. 03/29/18-She is here for evaluation for left great toe ulcer. Despite continuing to smoke and uncontrolled diabetes she continues to improve. She is compliant with offloading shoe. We will continue with the same treatment plan and she will follow-up next week 04/05/18- She is here in follow up evaluation for [name] left great toe ulcer; she presents with small pustule to left fifth toe (resembles ant bite). She admits to compliance with wearing offloading shoe; continues to smoke or have uncontrolled blood glucose control. There is more callus than usual with evidence of bleeding; she denies known trauma. 04/12/18-She is here for evaluation of left great toe ulcer. Despite noncompliance with glycemic control and smoking she continues to make improvement. She continues to wear offloading shoe. The pustule, that was identified last week, to the left fifth toe is resolved. She will follow-up in 2 weeks 05/03/18-she is seen in follow-up evaluation for [name] left great toe ulcer. She  is compliant with offloading,  otherwise noncompliant with glycemic control and smoking. She has plateaued and there is minimal improvement noted. We will transition to [facility], replaced the insert to her surgical shoe and she will follow-up in one week 05/10/18- She is here in follow up evaluation for [name] left great toe ulcer. It appears stable despite measurement change. We will continue with same treatment plan and follow up next week. 05/24/18-She is seen in follow-up evaluation for [name] left great toe ulcer. She remains compliant with offloading, has made significant improvement in her diet, decreasing the amount of sugar/soda. She said her recent A1c was 10.9 which is lower than. She did see [name] diabetic nutritionist/educator yesterday. She continues to smoke. We will continue with the same treatment plan and she'll follow-up next week. 05/31/18- She is seen in follow-up evaluation for left great toe ulcer. She continues to remain compliant with offloading, continues to make improvement in her diet, increasing her water and decreasing the amount of sugar/soda. She does continue to smoke with no desire to quit. We will apply Prisma to the depth and Hydrofera Blue over. We have not received insurance authorization for oasis. She will follow up next week. 06/07/18-She is seen in follow-up evaluation for left great toe ulcer. It has stalled according to today's measurements although base appears stable. She says she saw [name] diabetic educator yesterday; her average blood sugars are less than 300 which is an improvement for her. She continues to smoke and states "that's my next step" She continues with water over soda. We will order for xray, culture and reinstate ace wrap compression prior to placing apligraf for next week. She is voicing no complaints or concerns. Her dressing will change to iodoflex over the next week in preparation for apligraf. 06/14/18-She is seen in follow-up evaluation for left great toe ulcer. Plain film x-ray  performed last week was negative for osteomyelitis. Wound culture obtained last week grew strep B and OSSA; she is initiated on keflex and cefdinir today; there is erythema to the toe which could be from ace wrap compression, she has [name] history of wrapping too tight and has has been encouraged to maintain ace wraps that we place today. We will hold off on application of apligraf today, will apply next week after antibiotic therapy has been initiated. She admits today that she has resumed taking [name] shower with her foot/toe submerged in water, she has been reminded to keep foot/toe out of the bath water. She will be seen in follow up next week 06/21/18-she is seen in follow-up evaluation for left great toe ulcer. She is tolerating antibiotic therapy with no GI disturbance. The wound is stable. Apligraf was applied today. She has been decreasing her smoking, only had 4 cigarettes yesterday and 1 today. She continues being more compliant in diabetic diet. She will follow-up next week for evaluation of site, if stable will remove at 2 weeks. 06/28/18- She is here in follow up evalution. Apligraf was placed last week, she states the dressing fell off on Tuesday and she was dressing with hydrofera blue. She is healed and will be discharged from the clinic today. She has been instructed to continue with smoking cessation, continue monitoring glucose levels, offloading for an additional 4 weeks and continue with Cope, Amire [name]. ([id number]) hydrofera blue for additional two weeks for any possible microscopic opening. Readmission: 08/07/18 on evaluation today patient presents for reevaluation concerning the ulcer of her right great toe. She was previously discharged on  06/28/18 healed. Nonetheless she states that this began to show signs of drainage she subsequently went to her primary care provider. Subsequently an x-ray was performed on 08/01/18 which was negative. The patient was also placed on antibiotics at  that time. Fortunately they should have been effective for the infection. Nonetheless she's been experiencing some improvement but still has [name] lot of drainage coming from the wound itself. 08/14/18 on evaluation today patient's wound actually does show signs of improvement in regard to the erythema at this point. She has completed the antibiotics. With that being said we did discuss the possibility of placing her in [name] total contact cast as of today although I think that I may want to give this just [name] little bit more time to ensure nothing recurrence as far as her infection is concerned. I do not want to put in the cast and risk infection at that time if things are not completely resolved. With that being said she is gonna require some debridement today. 08/21/18 on evaluation today patient actually appears to be doing okay in regard to her toe ulcer. She's been tolerating the dressing changes without complication. With that being said it does appear that she is ready and in fact I think it's appropriate for [place] to go ahead and initiate the total contact cast today. Nonetheless she will require some sharp debridement to prepare the wound for application. Overall I feel like things have been progressing well but we do need to do something to get this to close more readily. Electronic Signature(s) Signed: 08/22/2018 1:21:52 AM By: Worthy Keeler PA-C Entered By: Worthy Keeler on 08/21/2018 23:56:43 [name] ([id number]) -------------------------------------------------------------------------------- Physical Exam Details Patient Name: [name] Date of Service: 08/21/2018 12:45 PM Medical Record Number: [id number] Patient Account Number: [id number] Date of Birth/Sex: 10/18/1976 (42 y.o. F) Treating RN: Montey Hora Primary Care Provider: Priscille Kluver Other Clinician: Referring Provider: Priscille Kluver Treating Provider/Extender: STONE III, Prabhav Faulkenberry Weeks in Treatment:  2 Constitutional Well-nourished and well-hydrated in no acute distress. Respiratory normal breathing without difficulty. Psychiatric this patient is able to make decisions and demonstrates good insight into disease process. Alert and Oriented x 3. pleasant and cooperative. Notes Patient's wound bed currently did have significant callous buildup surrounding the wounded margin. Subsequently this was removed as well as removing the slough and necrotic tissue from the surface of the wound which he tolerated without complication today. Post debridement the wound bed appears to be doing significantly better which is great news. Overall I'm very pleased with how things appear and then subsequently the total contact cast was applied by myself during the office visit today which again she tolerated without complication and stated appear to be as comfortable as could be post application. Nothing appear to be rubbing abnormally according to the patient. Electronic Signature(s) Signed: 08/22/2018 1:21:52 AM By: Worthy Keeler PA-C Entered By: Worthy Keeler on 08/21/2018 23:57:39 [name] ([id number]) -------------------------------------------------------------------------------- Physician Orders Details Patient Name: [name] Date of Service: 08/21/2018 12:45 PM Medical Record Number: [id number] Patient Account Number: [id number] Date of Birth/Sex: 12-19-1975 (42 y.o. F) Treating RN: Montey Hora Primary Care Provider: Priscille Kluver Other Clinician: Referring Provider: Priscille Kluver Treating Provider/Extender: Melburn Hake, Timm Bonenberger Weeks in Treatment: 2 Verbal / Phone Orders: No Diagnosis Coding ICD-10 Coding Code Description E11.621 Type 2 diabetes mellitus with foot ulcer L03.032 Cellulitis of left toe L97.522 Non-pressure chronic ulcer of other part of left foot with fat layer exposed Wound  Cleansing Wound #4 Left Toe Great o Clean wound with Normal Saline. Anesthetic  (add to Medication List) Wound #4 Left Toe Great o Topical Lidocaine 4% cream applied to wound bed prior to debridement (In Clinic Only). Primary Wound Dressing Wound #4 Left Toe Great o Silver Collagen o Drawtex Secondary Dressing Wound #4 Left Toe Great o Foam Dressing Change Frequency Wound #4 Left Toe Great o Other: - Friday and Tuesday 08/28/2018 Follow-up Appointments Wound #4 Left Toe Great o Return Appointment in 1 week. o Other: - Friday and then Tuesday 08/28/2018 Off-Loading Wound #4 Left Toe Great o Total Contact Cast to Left Lower Extremity Electronic Signature(s) Signed: 08/21/2018 5:02:25 PM By: Montey Hora Signed: 08/22/2018 1:21:52 AM By: Ronn Melena, Aysiah [name]. ([id number]) Entered By: Montey Hora on 08/21/2018 13:46:50 Herda, Nicolina [name]. ([id number]) -------------------------------------------------------------------------------- Problem List Details Patient Name: [name] Date of Service: 08/21/2018 12:45 PM Medical Record Number: [id number] Patient Account Number: [id number] Date of Birth/Sex: December 07, 1975 (42 y.o. F) Treating RN: Montey Hora Primary Care Provider: Priscille Kluver Other Clinician: Referring Provider: Priscille Kluver Treating Provider/Extender: Melburn Hake, Monnie Gudgel Weeks in Treatment: 2 Active Problems ICD-10 Evaluated Encounter Code Description Active Date Today Diagnosis E11.621 Type 2 diabetes mellitus with foot ulcer 08/07/2018 No Yes L03.032 Cellulitis of left toe 08/07/2018 No Yes L97.522 Non-pressure chronic ulcer of other part of left foot with fat 08/08/2018 No Yes layer exposed Inactive Problems Resolved Problems Electronic Signature(s) Signed: 08/22/2018 1:21:52 AM By: Worthy Keeler PA-C Entered By: Worthy Keeler on 08/21/2018 13:14:07 Ottaviano, Atlee [name]. ([id number]) -------------------------------------------------------------------------------- Progress Note Details Patient Name:  [name] Date of Service: 08/21/2018 12:45 PM Medical Record Number: [id number] Patient Account Number: [id number] Date of Birth/Sex: 10/25/1975 (42 y.o. F) Treating RN: Montey Hora Primary Care Provider: Priscille Kluver Other Clinician: Referring Provider: Priscille Kluver Treating Provider/Extender: Melburn Hake, Rosine Solecki Weeks in Treatment: 2 Subjective Chief Complaint Information obtained from Patient She is here for evaluation of [name] left great toe ulcer History of Present Illness (HPI) 01/18/18-She is here for initial evaluation of the left great toe ulcer. She is [name] poor historian in regards to timeframe in detail. She states approximately 4 weeks ago she lacerated her toe on something in the house. She followed up with her primary care who placed her on Bactrim and ultimately [name] second dose of Bactrim prior to coming to wound clinic. She states she has been treating the toe with peroxide, Betadine and [name] Band-Aid. She did not check her blood sugar this morning but checked it yesterday morning it was 327; she is unaware of [name] recent A1c and there are no current records. She saw Dr. she would've orthopedics last week for an old injury to the left ankle, she states he did not see her toe, nor did she bring it to his attention. She smokes approximately 1 pack cigarettes [name] day. Her social situation is concerning, she arrives this morning with her mother who appears extremely intoxicated/under the influence; her mother was asked to leave the room and be monitored by the patient's grandmother. The patient's aunt then accompanied the patient and the room throughout the rest of the appointment. We had [name] lengthy discussion regarding the deleterious effects of uncontrolled hyperglycemia and smoking as it relates to wound healing and overall health. She was strongly encouraged to decrease her smoking and get her diabetes under better control. She states she is currently on [name] diet and has cut down her  [facility] consumption.  The left toe is erythematous, macerated and slightly edematous with malodor present. The edema in her left foot is below her baseline, there is no erythema streaking. We will treat her with Santyl, doxycycline; we have ordered and xray, culture and provided [name] Peg assist surgical shoe and cultured the wound. 01/25/18-She is here in follow-up evaluation for [name] left great toe ulcer and presents with an abscess to her suprapubic area. She states her blood sugars remain elevated, feeling "sick" and if levels are below 250, but she is trying. She has made no attempt to decrease her smoking stating that we "can't take away her food in her cigarettes". She has been compliant with offloading using the PEG assist you. She is using Santyl daily. the culture obtained last week grew staph aureus and Enterococcus faecalis; continues on the doxycycline and Augmentin was added on Monday. The suprapubic area has erythema, no femoral variation, purple discoloration, minimal induration, was accessed with [name] cotton tip applicator with sanguinopurulent drainage, this was cultured, I suspect the current antibiotic treatment will cover and we will not add anything to her current treatment plan. She was advised to go to urgent care or ER with any change in redness, induration or fever. 02/01/18-She is here in follow-up evaluation for left great toe ulcers and [name] new abdominal abscess from last week. She was able to use packing until earlier this week, where she "forgot it was there". She states she was feeling ill with GI symptoms last week and was not taking her antibiotic. She states her glucose levels have been predominantly less than 200, with occasional levels between 200-250. She thinks this was contributing to her GI symptoms as they have resolved without intervention. There continues to be significant laceration to left toe, otherwise it clinically looks stable/improved. There is now less  superficial opening to the lateral aspect of the great toe that was residual blister. We will transition to [facility] to all wounds, she will continue her Augmentin. If there is no change or deterioration next week for reculture. 02/08/18-She is here in follow-up evaluation for left great toe ulcer and abdominal ulcer. There is an improvement in both wounds. She has been wrapping her left toe with coban, not by our direction, which has created an area of discoloration to the medial aspect; she has been advised to NOT use coban secondary to her neuropathy. She states her glucose levels have been high over this last week ranging from 200-350, she continues to smoke. She admits to being less compliant with her offloading shoe. We will continue with same treatment plan and she will follow-up next week. 02/15/18-She is here in follow-up evaluation for left great toe ulcer and abdominal ulcer. The abdominal ulcer is epithelialized. The left great toe ulcer is improved and all injury from last week using the Coban wrap is resolved, the lateral ulcer is healed. She admits to noncompliance with wearing offloading shoe and admits to glucose levels being greater than 300 most of the week. She continues to smoke and expresses no desire to quit. There is one area medially that probes deeper than it has historically, erythema to the toe and dorsal foot has consistently waxed and waned. There is no overt signs of cellulitis or infection but we will culture the wound for any occult infection given the new area of depth and erythema. We will hold off on Guyett, Oretta [name]. ([id number]) sensitivities for initiation of antibiotic therapy. 02/22/18-She is here in follow up evaluation for left great  toe ulcer. There is overall significant improvement in both wound appearance, erythema and edema with changes made last week. She was not initiated on antibiotic therapy. Culture obtained last week showed oxacillin sensitive  staph aureus, sensitive to clindamycin. Clindamycin has been called into the pharmacy but she has been instructed to hold off on initiation secondary to overall clinical improvement and her history of antibiotic intolerance. She has been instructed to contact the clinic with any noted changes/deterioration and the wound, erythema, edema and/or pain. She will follow-up next week. She continues to smoke and her glucose levels remain elevated >250; she admits to compliance with offloading shoe 03/01/18 on evaluation today patient appears to be doing fairly well in regard to her left first toe ulcer. She has been tolerating the dressing changes with the [facility] Dressing without complication and overall this has definitely showed signs of improvement according to records as well is what the patient tells me today. I'm very pleased in that regard. She is having no pain today 03/08/18 She is here for follow up evaluation of [name] left great toe ulcer. She remains non-compliant with glucose control and smoking cessation; glucose levels consistently >200. She states that she got new shoe inserts/peg assist. She admits to compliance with offloading. Since my last evaluation there is significant improvement. We will switch to prisma at this time and she will follow up next week. She is noted to be tachycardic at this appointment, heart rate 120s; she has [name] history of heart rate 70-130 according to our records. She admits to extreme agitation r/t personal issues; she was advised to monitor her heartrate and contact her physician if it does not return to [name] more normal range (<100). She takes cardizem twice daily. 03/15/18-She is here in follow-up evaluation for left great toe ulcer. She remains noncompliant with glucose control and smoking cessation. She admits to compliance with wearing offloading shoe. The ulcer is improved/stable and we will continue with the same treatment plan and she will follow-up next  week 03/22/18-She is here for evaluation for left great toe ulcer. There continues to be significant improvement despite recurrent hyperglycemia (over 500 yesterday) and she continues to smoke. She has been compliant with offloading and we will continue with same treatment plan and she will follow-up next week. 03/29/18-She is here for evaluation for left great toe ulcer. Despite continuing to smoke and uncontrolled diabetes she continues to improve. She is compliant with offloading shoe. We will continue with the same treatment plan and she will follow-up next week 04/05/18- She is here in follow up evaluation for [name] left great toe ulcer; she presents with small pustule to left fifth toe (resembles ant bite). She admits to compliance with wearing offloading shoe; continues to smoke or have uncontrolled blood glucose control. There is more callus than usual with evidence of bleeding; she denies known trauma. 04/12/18-She is here for evaluation of left great toe ulcer. Despite noncompliance with glycemic control and smoking she continues to make improvement. She continues to wear offloading shoe. The pustule, that was identified last week, to the left fifth toe is resolved. She will follow-up in 2 weeks 05/03/18-she is seen in follow-up evaluation for [name] left great toe ulcer. She is compliant with offloading, otherwise noncompliant with glycemic control and smoking. She has plateaued and there is minimal improvement noted. We will transition to [facility], replaced the insert to her surgical shoe and she will follow-up in one week 05/10/18- She is here in follow up  evaluation for [name] left great toe ulcer. It appears stable despite measurement change. We will continue with same treatment plan and follow up next week. 05/24/18-She is seen in follow-up evaluation for [name] left great toe ulcer. She remains compliant with offloading, has made significant improvement in her diet, decreasing the amount of sugar/soda.  She said her recent A1c was 10.9 which is lower than. She did see [name] diabetic nutritionist/educator yesterday. She continues to smoke. We will continue with the same treatment plan and she'll follow-up next week. 05/31/18- She is seen in follow-up evaluation for left great toe ulcer. She continues to remain compliant with offloading, continues to make improvement in her diet, increasing her water and decreasing the amount of sugar/soda. She does continue to smoke with no desire to quit. We will apply Prisma to the depth and Hydrofera Blue over. We have not received insurance authorization for oasis. She will follow up next week. 06/07/18-She is seen in follow-up evaluation for left great toe ulcer. It has stalled according to today's measurements although base appears stable. She says she saw [name] diabetic educator yesterday; her average blood sugars are less than 300 which is an improvement for her. She continues to smoke and states "that's my next step" She continues with water over soda. We will order for xray, culture and reinstate ace wrap compression prior to placing apligraf for next week. She is voicing no complaints or concerns. Her dressing will change to iodoflex over the next week in preparation for apligraf. 06/14/18-She is seen in follow-up evaluation for left great toe ulcer. Plain film x-ray performed last week was negative for osteomyelitis. Wound culture obtained last week grew strep B and OSSA; she is initiated on keflex and cefdinir today; there is erythema to the toe which could be from ace wrap compression, she has [name] history of wrapping too tight and has has been encouraged to maintain ace wraps that we place today. We will hold off on application of apligraf today, will apply next week after antibiotic therapy has been initiated. She admits today that she has resumed taking [name] shower with her foot/toe submerged in water, she has been reminded to keep foot/toe out of the bath water. She  will be seen in follow up next week 06/21/18-she is seen in follow-up evaluation for left great toe ulcer. She is tolerating antibiotic therapy with no GI disturbance. The wound is stable. Apligraf was applied today. She has been decreasing her smoking, only had 4 cigarettes yesterday and Ontiveros, Julian [name]. ([id number]) 1 today. She continues being more compliant in diabetic diet. She will follow-up next week for evaluation of site, if stable will remove at 2 weeks. 06/28/18- She is here in follow up evalution. Apligraf was placed last week, she states the dressing fell off on Tuesday and she was dressing with hydrofera blue. She is healed and will be discharged from the clinic today. She has been instructed to continue with smoking cessation, continue monitoring glucose levels, offloading for an additional 4 weeks and continue with hydrofera blue for additional two weeks for any possible microscopic opening. Readmission: 08/07/18 on evaluation today patient presents for reevaluation concerning the ulcer of her right great toe. She was previously discharged on 06/28/18 healed. Nonetheless she states that this began to show signs of drainage she subsequently went to her primary care provider. Subsequently an x-ray was performed on 08/01/18 which was negative. The patient was also placed on antibiotics at that time. Fortunately they should have been effective  for the infection. Nonetheless she's been experiencing some improvement but still has [name] lot of drainage coming from the wound itself. 08/14/18 on evaluation today patient's wound actually does show signs of improvement in regard to the erythema at this point. She has completed the antibiotics. With that being said we did discuss the possibility of placing her in [name] total contact cast as of today although I think that I may want to give this just [name] little bit more time to ensure nothing recurrence as far as her infection is concerned. I do not want to  put in the cast and risk infection at that time if things are not completely resolved. With that being said she is gonna require some debridement today. 08/21/18 on evaluation today patient actually appears to be doing okay in regard to her toe ulcer. She's been tolerating the dressing changes without complication. With that being said it does appear that she is ready and in fact I think it's appropriate for [place] to go ahead and initiate the total contact cast today. Nonetheless she will require some sharp debridement to prepare the wound for application. Overall I feel like things have been progressing well but we do need to do something to get this to close more readily. Patient History Information obtained from Patient. Family History Cancer - Paternal Grandparents, Heart Disease - Paternal Grandparents, Hypertension - Paternal Grandparents, No family history of Diabetes, Hereditary Spherocytosis, Kidney Disease, Lung Disease, Seizures, Stroke, Thyroid Problems, Tuberculosis. Social History Current every day smoker, Marital Status - Single, Alcohol Use - Never, Drug Use - Prior History, Caffeine Use - Daily. Review of Systems (ROS) Constitutional Symptoms (General Health) Denies complaints or symptoms of Fever, Chills. Respiratory The patient has no complaints or symptoms. Cardiovascular The patient has no complaints or symptoms. Psychiatric The patient has no complaints or symptoms. Objective Swallows, Cookie [name]. ([id number]) Constitutional Well-nourished and well-hydrated in no acute distress. Vitals Time Taken: 12:48 PM, Height: 69 in, Weight: 240 lbs, BMI: 35.4, Temperature: 98.0 F, Pulse: 99 bpm, Respiratory Rate: 16 breaths/min, Blood Pressure: 122/78 mmHg. Respiratory normal breathing without difficulty. Psychiatric this patient is able to make decisions and demonstrates good insight into disease process. Alert and Oriented x 3. pleasant and cooperative. General Notes:  Patient's wound bed currently did have significant callous buildup surrounding the wounded margin. Subsequently this was removed as well as removing the slough and necrotic tissue from the surface of the wound which he tolerated without complication today. Post debridement the wound bed appears to be doing significantly better which is great news. Overall I'm very pleased with how things appear and then subsequently the total contact cast was applied by myself during the office visit today which again she tolerated without complication and stated appear to be as comfortable as could be post application. Nothing appear to be rubbing abnormally according to the patient. Integumentary (Hair, Skin) Wound #4 status is Open. Original cause of wound was Gradually Appeared. The wound is located on the Left Toe Great. The wound measures 0.5cm length x 0.3cm width x 0.4cm depth; 0.118cm^2 area and 0.047cm^3 volume. There is Fat Layer (Subcutaneous Tissue) Exposed exposed. There is no tunneling or undermining noted. There is [name] medium amount of serous drainage noted. The wound margin is flat and intact. There is small (1-33%) granulation within the wound bed. There is [name] medium (34-66%) amount of necrotic tissue within the wound bed including Adherent Slough. The periwound skin appearance exhibited: Callus. The periwound skin appearance did not  exhibit: Crepitus, Excoriation, Induration, Rash, Scarring, Dry/Scaly, Maceration, Atrophie Blanche, Cyanosis, Ecchymosis, Hemosiderin Staining, Mottled, Pallor, Rubor, Erythema. Periwound temperature was noted as Hot. Assessment Active Problems ICD-10 Type 2 diabetes mellitus with foot ulcer Cellulitis of left toe Non-pressure chronic ulcer of other part of left foot with fat layer exposed Procedures Wound #4 Pre-procedure diagnosis of Wound #4 is [name] Diabetic Wound/Ulcer of the Lower Extremity located on the Left Toe Great .Severity of Tissue Pre Debridement is:  Fat layer exposed. There was [name] Excisional Skin/Subcutaneous Tissue Debridement with [name] total area of 0.15 sq cm performed by STONE III, Eural Holzschuh E., PA-C. With the following instrument(s): Curette to remove Viable and Non-Viable tissue/material. Material removed includes Callus, Subcutaneous Tissue, and Slough after achieving pain control using Lidocaine 4% Topical Solution. No specimens were taken. [name] time out was Widger, Julyssa [name]. ([id number]) conducted at 13:31, prior to the start of the procedure. [name] Minimum amount of bleeding was controlled with Pressure. The procedure was tolerated well with [name] pain level of 0 throughout and [name] pain level of 0 following the procedure. Post Debridement Measurements: 0.6cm length x 0.3cm width x 0.3cm depth; 0.042cm^3 volume. Character of Wound/Ulcer Post Debridement is improved. Severity of Tissue Post Debridement is: Fat layer exposed. Post procedure Diagnosis Wound #4: Same as Pre-Procedure Pre-procedure diagnosis of Wound #4 is [name] Diabetic Wound/Ulcer of the Lower Extremity located on the Left Toe Great . There was [name] Total Contact Cast Procedure by STONE III, Donnielle Addison E., PA-C. Post procedure Diagnosis Wound #4: Same as Pre-Procedure Plan Wound Cleansing: Wound #4 Left Toe Great: Clean wound with Normal Saline. Anesthetic (add to Medication List): Wound #4 Left Toe Great: Topical Lidocaine 4% cream applied to wound bed prior to debridement (In Clinic Only). Primary Wound Dressing: Wound #4 Left Toe Great: Silver Collagen Drawtex Secondary Dressing: Wound #4 Left Toe Great: Foam Dressing Change Frequency: Wound #4 Left Toe Great: Other: - Friday and Tuesday 08/28/2018 Follow-up Appointments: Wound #4 Left Toe Great: Return Appointment in 1 week. Other: - Friday and then Tuesday 08/28/2018 Off-Loading: Wound #4 Left Toe Great: Total Contact Cast to Left Lower Extremity Patient's wound bed currently has been doing okay but I think with more appropriate  offloading she will do even better. I'm for that reason initiating the total contact cast which I hope will be of benefit for her. If anything changes or worsens in the interim she will let me know. Otherwise we will see were things stand at follow-up. Please see above for specific wound care orders. We will see patient for re-evaluation in 1 week(s) here in the clinic. If anything worsens or changes patient will contact our office for additional recommendations. Electronic Signature(s) Signed: 08/22/2018 1:21:52 AM By: Worthy Keeler PA-C Entered By: Worthy Keeler on 08/21/2018 23:58:16 [name] ([name], Abran Duke ([id number]) -------------------------------------------------------------------------------- ROS/PFSH Details Patient Name: [name] Date of Service: 08/21/2018 12:45 PM Medical Record Number: [id number] Patient Account Number: [id number] Date of Birth/Sex: 09-24-1976 (42 y.o. F) Treating RN: Montey Hora Primary Care Provider: Priscille Kluver Other Clinician: Referring Provider: Priscille Kluver Treating Provider/Extender: STONE III, Jarielys Girardot Weeks in Treatment: 2 Information Obtained From Patient Wound History Do you currently have one or more open woundso Yes How many open wounds do you currently haveo 1 Approximately how long have you had your woundso 1 How have you been treating your wound(s) until nowo murpiricin Has your wound(s) ever healed and then re-openedo Yes Have you had any lab work  done in the past montho Yes Have you tested positive for an antibiotic resistant organism (MRSA, VRE)o Yes Have you tested positive for osteomyelitis (bone infection)o No Have you had any tests for circulation on your legso No Constitutional Symptoms (General Health) Complaints and Symptoms: Negative for: Fever; Chills Eyes Medical History: Negative for: Cataracts; Glaucoma; Optic Neuritis Ear/Nose/Mouth/Throat Medical History: Negative for: Chronic  sinus problems/congestion; Middle ear problems Hematologic/Lymphatic Medical History: Negative for: Anemia; Hemophilia; Human Immunodeficiency Virus; Lymphedema; Sickle Cell Disease Respiratory Complaints and Symptoms: No Complaints or Symptoms Medical History: Positive for: Chronic Obstructive Pulmonary Disease (COPD) Negative for: Aspiration; Asthma; Pneumothorax; Sleep Apnea; Tuberculosis Cardiovascular Complaints and Symptoms: No Complaints or Symptoms Medical History: Positive for: Arrhythmia Negative for: Angina; Congestive Heart Failure; Coronary Artery Disease; Deep Vein Thrombosis; Hypertension; Berggren, Adora [name]. ([id number]) Hypotension; Myocardial Infarction; Peripheral Arterial Disease; Peripheral Venous Disease; Phlebitis; Vasculitis Gastrointestinal Medical History: Negative for: Cirrhosis ; Colitis; Crohnos; Hepatitis [name]; Hepatitis B; Hepatitis C Endocrine Medical History: Positive for: Type II Diabetes Negative for: Type I Diabetes Time with diabetes: 10 years Treated with: Insulin, Oral agents Blood sugar tested every day: Yes Tested : 2 time daily Blood sugar testing results: Breakfast: 250 Genitourinary Medical History: Negative for: End Stage Renal Disease Immunological Medical History: Negative for: Lupus Erythematosus; Raynaudos; Scleroderma Integumentary (Skin) Medical History: Negative for: History of Burn; History of pressure wounds Musculoskeletal Medical History: Negative for: Gout; Rheumatoid Arthritis; Osteoarthritis; Osteomyelitis Neurologic Medical History: Positive for: Neuropathy - legs and feet; Seizure Disorder Oncologic Medical History: Negative for: Received Chemotherapy; Received Radiation Psychiatric Complaints and Symptoms: No Complaints or Symptoms Medical History: Positive for: Confinement Anxiety Negative for: Anorexia/bulimia Immunizations Dearmond, Daylyn [name]. ([id number]) Pneumococcal Vaccine: Received Pneumococcal  Vaccination: Yes Immunization Notes: up to date Implantable Devices Family and Social History Cancer: Yes - Paternal Grandparents; Diabetes: No; Heart Disease: Yes - Paternal Grandparents; Hereditary Spherocytosis: No; Hypertension: Yes - Paternal Grandparents; Kidney Disease: No; Lung Disease: No; Seizures: No; Stroke: No; Thyroid Problems: No; Tuberculosis: No; Current every day smoker; Marital Status - Single; Alcohol Use: Never; Drug Use: Prior History; Caffeine Use: Daily; Financial Concerns: No; Food, Clothing or Shelter Needs: No; Support System Lacking: No; Transportation Concerns: No; Advanced Directives: No; Patient does not want information on Advanced Directives; Living Will: No; Medical Power of Attorney: No Physician Affirmation I have reviewed and agree with the above information. Electronic Signature(s) Signed: 08/22/2018 1:21:52 AM By: Worthy Keeler PA-C Signed: 08/22/2018 5:41:21 PM By: Montey Hora Entered By: Worthy Keeler on 08/21/2018 23:57:02 Eichler, Kenedie AMarland Kitchen ([id number]) -------------------------------------------------------------------------------- Total Contact Cast Details Patient Name: [name] Date of Service: 08/21/2018 12:45 PM Medical Record Number: [id number] Patient Account Number: [id number] Date of Birth/Sex: 1976/07/30 (42 y.o. F) Treating RN: Montey Hora Primary Care Provider: Priscille Kluver Other Clinician: Referring Provider: Priscille Kluver Treating Provider/Extender: STONE III, Arleta Ostrum Weeks in Treatment: 2 Total Contact Cast Applied for Wound Assessment: Wound #4 Left Toe Great Performed By: Physician Emilio Math., PA-C Post Procedure Diagnosis Same as Pre-procedure Electronic Signature(s) Signed: 08/21/2018 5:02:25 PM By: Montey Hora Signed: 08/22/2018 1:21:52 AM By: Worthy Keeler PA-C Entered By: Montey Hora on 08/21/2018 13:59:30 Gudiel, Jamyla [name].  ([id number]) -------------------------------------------------------------------------------- SuperBill Details Patient Name: [name] Date of Service: 08/21/2018 Medical Record Number: [id number] Patient Account Number: [id number] Date of Birth/Sex: 12/08/1975 (42 y.o. F) Treating RN: Montey Hora Primary Care Provider: Priscille Kluver Other Clinician: Referring Provider: Priscille Kluver Treating Provider/Extender: STONE III, Dreydon Cardenas Weeks in Treatment: 2 Diagnosis  Coding ICD-10 Codes Code Description E11.621 Type 2 diabetes mellitus with foot ulcer L03.032 Cellulitis of left toe L97.522 Non-pressure chronic ulcer of other part of left foot with fat layer exposed Facility Procedures CPT4 Code: [id number] Description: 38184 - DEB SUBQ TISSUE 20 SQ CM/< ICD-10 Diagnosis Description L97.522 Non-pressure chronic ulcer of other part of left foot with fat Modifier: layer exposed Quantity: 1 Physician Procedures CPT4 Code: [id number] Description: 11042 - WC PHYS SUBQ TISS 20 SQ CM ICD-10 Diagnosis Description L97.522 Non-pressure chronic ulcer of other part of left foot with fat Modifier: layer exposed Quantity: 1 Electronic Signature(s) Signed: 08/22/2018 1:21:52 AM By: Worthy Keeler PA-C Entered By: Worthy Keeler on 08/21/2018 23:58:23

## 2018-08-25 NOTE — Progress Notes (Signed)
AYLLA, HUFFINE ([id number]) Visit Report for 08/21/2018 Arrival Information Details Patient Name: [name], [name] Date of Service: 08/21/2018 12:45 PM Medical Record Number: [id number] Patient Account Number: [id number] Date of Birth/Sex: 07-05-1976 (42 y.o. F) Treating RN: Cornell Barman Primary Care Allen Basista: Priscille Kluver Other Clinician: Referring Patra Gherardi: Priscille Kluver Treating Jozie Wulf/Extender: Melburn Hake, HOYT Weeks in Treatment: 2 Visit Information History Since Last Visit Added or deleted any medications: No Patient Arrived: Ambulatory Any new allergies or adverse reactions: No Arrival Time: 12:45 Had a fall or experienced change in No Accompanied By: Mom activities of daily living that may affect Transfer Assistance: None risk of falls: Patient Identification Verified: Yes Signs or symptoms of abuse/neglect since last visito No Secondary Verification Process Completed: Yes Hospitalized since last visit: No Implantable device outside of the clinic excluding No cellular tissue based products placed in the center since last visit: Pain Present Now: No Electronic Signature(s) Signed: 08/23/2018 7:26:08 AM By: Gretta Cool, BSN, RN, CWS, [name] RN, BSN Entered By: Gretta Cool, BSN, RN, CWS, [name] on 08/21/2018 12:48:33 [name] ([id number]) -------------------------------------------------------------------------------- Encounter Discharge Information Details Patient Name: Heather Dear A. Date of Service: 08/21/2018 12:45 PM Medical Record Number: [id number] Patient Account Number: [id number] Date of Birth/Sex: 07-01-1976 (42 y.o. F) Treating RN: Montey Hora Primary Care Andrew Soria: Priscille Kluver Other Clinician: Referring Leigha Olberding: Priscille Kluver Treating Orit Sanville/Extender: Melburn Hake, HOYT Weeks in Treatment: 2 Encounter Discharge Information Items Post Procedure Vitals Discharge Condition: Stable Temperature (F): 98.0 Ambulatory Status: Wheelchair Pulse (bpm): 99 Discharge  Destination: Home Respiratory Rate (breaths/min): 16 Transportation: Private Auto Blood Pressure (mmHg): 122/78 Accompanied By: mom Schedule Follow-up Appointment: Yes Clinical Summary of Care: Electronic Signature(s) Signed: 08/21/2018 5:02:25 PM By: Montey Hora Entered By: Montey Hora on 08/21/2018 14:21:38 [name] ([id number]) -------------------------------------------------------------------------------- Lower Extremity Assessment Details Patient Name: Heather Dear A. Date of Service: 08/21/2018 12:45 PM Medical Record Number: [id number] Patient Account Number: [id number] Date of Birth/Sex: 05/29/1976 (42 y.o. F) Treating RN: Cornell Barman Primary Care Welda Azzarello: Priscille Kluver Other Clinician: Referring Corinne Goucher: Priscille Kluver Treating Leaner Morici/Extender: STONE III, HOYT Weeks in Treatment: 2 Vascular Assessment Pulses: Dorsalis Pedis Palpable: [Right:Yes] Posterior Tibial Extremity colors, hair growth, and conditions: Extremity Color: [Right:Normal] Hair Growth on Extremity: [Right:Yes] Temperature of Extremity: [Right:Warm] Capillary Refill: [Right:< 3 seconds] Toe Nail Assessment Left: Right: Thick: No Discolored: No Deformed: No Improper Length and Hygiene: No Electronic Signature(s) Signed: 08/23/2018 7:26:08 AM By: Gretta Cool, BSN, RN, CWS, [name] RN, BSN Entered By: Gretta Cool, BSN, RN, CWS, [name] on 08/21/2018 12:53:31 [name] ([id number]) -------------------------------------------------------------------------------- Multi Wound Chart Details Patient Name: Heather Dear A. Date of Service: 08/21/2018 12:45 PM Medical Record Number: [id number] Patient Account Number: [id number] Date of Birth/Sex: 1976/08/02 (42 y.o. 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] [N/A:N/A] Comorbid History: [4:Chronic Obstructive Pulmonary Disease (COPD), Arrhythmia, Type II Diabetes, Neuropathy, Seizure Disorder, Confinement Anxiety] [N/A:N/A] Date Acquired: [4:07/24/2018] [N/A:N/A] Weeks of Treatment: [4:2] [N/A:N/A] Wound Status: [4:Open] [N/A:N/A] Pending Amputation on [4:Yes] [N/A:N/A] Presentation: Measurements L x W x D [4:0.5x0.3x0.4] [N/A:N/A] (cm) Area (cm) : [4:0.118] [N/A:N/A] Volume (cm) : [4:0.047] [N/A:N/A] % Reduction in Area: [4:92.40%] [N/A:N/A] % Reduction in  Volume: [4:69.90%] [N/A:N/A] Classification: [4:Grade 1] [N/A:N/A] Exudate Amount: [4:Medium] [N/A:N/A] Exudate Type: [4:Serous] [N/A:N/A] Exudate Color: [4:amber] [N/A:N/A] Wound Margin: [4:Flat and Intact] [N/A:N/A] Granulation Amount: [4:Small (1-33%)] [N/A:N/A] Necrotic Amount: [4:Medium (34-66%)] [N/A:N/A] Exposed Structures: [4:Fat Layer (Subcutaneous Tissue) Exposed: Yes Fascia: No Tendon: No Muscle: No Joint: No Bone: No] [N/A:N/A] Epithelialization: [4:None] [N/A:N/A] Periwound Skin Texture: [N/A:N/A] Callus: Yes Excoriation: No Induration: No Crepitus: No Rash: No Scarring: No Periwound Skin Moisture: Maceration: No N/A N/A Dry/Scaly: No Periwound Skin Color: Atrophie Blanche: No N/A N/A Cyanosis: No Ecchymosis: No Erythema: No Hemosiderin Staining: No Mottled: No Pallor: No Rubor: No Temperature: Hot N/A N/A Tenderness on Palpation: No N/A N/A Wound Preparation: Ulcer Cleansing: N/A N/A Rinsed/Irrigated with Saline Topical Anesthetic Applied: Other: lidocaine 4% Treatment Notes Electronic Signature(s) Signed: 08/21/2018  5:02:25 PM By: Montey Hora Entered By: Montey Hora on 08/21/2018 13:31:19 [name] ([id number]) -------------------------------------------------------------------------------- Mona Details Patient Name: Heather Dear A. Date of Service: 08/21/2018 12:45 PM Medical Record Number: [id number] Patient Account Number: [id number] Date of Birth/Sex: 07-02-1976 (42 y.o. F) Treating RN: Montey Hora Primary Care Zelina Jimerson: Priscille Kluver Other Clinician: Referring Faithanne Verret: Priscille Kluver Treating Margrette Wynia/Extender: Melburn Hake, HOYT Weeks in Treatment: 2 Active Inactive ` Abuse / Safety / Falls / Self Care Management Nursing Diagnoses: History of Falls Goals: Patient will not experience any injury related to falls Date Initiated: 08/07/2018 Target Resolution Date: 10/19/2018 Goal Status: Active Interventions: Assess fall risk on admission and as needed Notes: ` Orientation to the Wound Care Program Nursing Diagnoses: Knowledge deficit related to the wound healing center program Goals: Patient/caregiver will verbalize understanding of the [facility] Date Initiated: 08/07/2018 Target Resolution Date: 10/19/2018 Goal Status: Active Interventions: Provide education on orientation to the wound center Notes: ` Soft Tissue Infection Nursing Diagnoses: Impaired tissue integrity Goals: Patient will remain free of wound infection Date Initiated: 08/07/2018 Target Resolution Date: 10/19/2018 Goal Status: Active Interventions: [name], [name] ([id number]) Assess signs and symptoms of infection every visit Notes: ` Wound/Skin Impairment Nursing Diagnoses: Impaired tissue integrity Goals: Ulcer/skin breakdown will heal within 14 weeks Date Initiated: 08/07/2018 Target Resolution Date: 10/19/2018 Goal Status: Active Interventions: Assess patient/caregiver ability to obtain necessary supplies Notes: Electronic  Signature(s) Signed: 08/21/2018 5:02:25 PM By: Montey Hora Entered By: Montey Hora on 08/21/2018 13:31:12 [name], [name] ([id number]) -------------------------------------------------------------------------------- Pain Assessment Details Patient Name: Heather Dear A. Date of Service: 08/21/2018 12:45 PM Medical Record Number: [id number] Patient Account Number: [id number] Date of Birth/Sex: 1976/08/07 (42 y.o. F) Treating RN: Cornell Barman Primary Care Saumya Hukill: Priscille Kluver Other Clinician: Referring Gradyn Shein: Priscille Kluver Treating Reeya Bound/Extender: Melburn Hake, HOYT Weeks in Treatment: 2 Active Problems Location of Pain Severity and Description of Pain Patient Has Paino No Site Locations Pain Management and Medication Current Pain Management: Electronic Signature(s) Signed: 08/23/2018 7:26:08 AM By: Gretta Cool, BSN, RN, CWS, [name] RN, BSN Entered By: Gretta Cool, BSN, RN, CWS, [name] on 08/21/2018 12:48:38 [name] ([id number]) -------------------------------------------------------------------------------- Patient/Caregiver Education Details Patient Name: Heather Dear A. Date of Service: 08/21/2018 12:45 PM Medical Record Number: [id number] Patient Account Number: [id number] Date of Birth/Gender: [birth year] (42 y.o. F) Treating RN: Montey Hora Primary Care Physician: Priscille Kluver Other Clinician: Referring Physician: Priscille Kluver Treating Physician/Extender: Sharalyn Ink in Treatment: 2 Education Assessment Education Provided To: Patient and Caregiver Education Topics Provided Offloading: Handouts: Other: TCC precautions Methods: Explain/Verbal Responses: State content correctly Electronic Signature(s) Signed: 08/21/2018 5:02:25 PM By: Montey Hora Entered By: Montey Hora on 08/21/2018 13:38:30 [name], Heather  A. ([id number]) -------------------------------------------------------------------------------- Wound Assessment Details Patient Name: [name],  [name] Date of Service: 08/21/2018 12:45 PM Medical Record Number: [id number] Patient Account Number: [id number] Date of Birth/Sex: 12/09/1975 (42 y.o. F) Treating RN: Cornell Barman Primary Care Adren Dollins: Priscille Kluver Other Clinician: Referring Tadarius Maland: Priscille Kluver Treating Jaidev Sanger/Extender: STONE III, HOYT Weeks in Treatment: 2 Wound Status Wound Number: 4 Primary Diabetic Wound/Ulcer of the Lower Extremity Etiology: Wound Location: Left Toe Great Wound Open Wounding Event: Gradually Appeared Status: Date Acquired: 07/24/2018 Comorbid Chronic Obstructive Pulmonary Disease Weeks Of Treatment: 2 History: (COPD), Arrhythmia, Type II Diabetes, Clustered Wound: No Neuropathy, Seizure Disorder, Confinement Pending Amputation On Presentation Anxiety Photos Photo Uploaded By: Gretta Cool, BSN, RN, CWS, [name] on 08/21/2018 16:38:23 Wound Measurements Length: (cm) 0.5 Width: (cm) 0.3 Depth: (cm) 0.4 Area: (cm) 0.118 Volume: (cm) 0.047 % Reduction in Area: 92.4% % Reduction in Volume: 69.9% Epithelialization: None Tunneling: No Undermining: No Wound Description Classification: Grade 1 Foul Odor Wound Margin: Flat and Intact Slough/Fi Exudate Amount: Medium Exudate Type: Serous Exudate Color: amber After Cleansing: No brino No Wound Bed Granulation Amount: Small (1-33%) Exposed Structure Necrotic Amount: Medium (34-66%) Fascia Exposed: No Necrotic Quality: Adherent Slough Fat Layer (Subcutaneous Tissue) Exposed: Yes Tendon Exposed: No Muscle Exposed: No Joint Exposed: No Bone Exposed: No [name], [name] ([id number]) Periwound Skin Texture Texture Color No Abnormalities Noted: No No Abnormalities Noted: No Callus: Yes Atrophie Blanche: No Crepitus: No Cyanosis: No Excoriation: No Ecchymosis: No Induration: No Erythema: No Rash: No Hemosiderin Staining: No Scarring: No Mottled: No Pallor: No Moisture Rubor: No No Abnormalities Noted: No Dry / Scaly: No  Temperature / Pain Maceration: No Temperature: Hot Wound Preparation Ulcer Cleansing: Rinsed/Irrigated with Saline Topical Anesthetic Applied: Other: lidocaine 4%, Treatment Notes Wound #4 (Left Toe Great) 1. Cleansed with: Clean wound with Normal Saline 2. Anesthetic Topical Lidocaine 4% cream to wound bed prior to debridement 4. Dressing Applied: Foam Prisma Ag 6. Footwear/Offloading device applied Other footwear/offloading device applied (specify in notes) Notes Prisma Ag, Drawtex, foam and TCC applied in clinic today Electronic Signature(s) Signed: 08/23/2018 7:26:08 AM By: Gretta Cool, BSN, RN, CWS, [name] RN, BSN Entered By: Gretta Cool, BSN, RN, CWS, [name] on 08/21/2018 12:52:37 [name] ([id number]) -------------------------------------------------------------------------------- Vitals Details Patient Name: Heather Dear A. Date of Service: 08/21/2018 12:45 PM Medical Record Number: [id number] Patient Account Number: [id number] Date of Birth/Sex: 10-Apr-1976 (42 y.o. F) Treating RN: Cornell Barman Primary Care Tysheka Fanguy: Priscille Kluver Other Clinician: Referring Peri Kreft: Priscille Kluver Treating Mason Burleigh/Extender: Melburn Hake, HOYT Weeks in Treatment: 2 Vital Signs Time Taken: 12:48 Temperature (F): 98.0 Height (in): 69 Pulse (bpm): 99 Weight (lbs): 240 Respiratory Rate (breaths/min): 16 Body Mass Index (BMI): 35.4 Blood Pressure (mmHg): 122/78 Reference Range: 80 - 120 mg / dl Electronic Signature(s) Signed: 08/23/2018 7:26:08 AM By: Gretta Cool, BSN, RN, CWS, [name] RN, BSN Entered By: Gretta Cool, BSN, RN, CWS, [name] on 08/21/2018 12:49:08

## 2018-08-28 NOTE — Progress Notes (Signed)
CADI, RHINEHART ([id number]) Visit Report for 08/24/2018 Arrival Information Details Patient Name: [name], [name] Date of Service: 08/24/2018 9:00 AM Medical Record Number: [id number] Patient Account Number: [id number] Date of Birth/Sex: 12/23/1975 (42 y.o. F) Treating RN: Montey Hora Primary Care Navi Erber: Priscille Kluver Other Clinician: Referring Lot Medford: Priscille Kluver Treating Tamu Golz/Extender: Melburn Hake, HOYT Weeks in Treatment: 2 Visit Information History Since Last Visit Added or deleted any medications: No Patient Arrived: Wheel Chair Any new allergies or adverse reactions: No Arrival Time: 09:06 Had a fall or experienced change in No Accompanied By: father activities of daily living that may affect Transfer Assistance: None risk of falls: Patient Identification Verified: Yes Signs or symptoms of abuse/neglect since last visito No Secondary Verification Process Completed: Yes Hospitalized since last visit: No Implantable device outside of the clinic excluding No cellular tissue based products placed in the center since last visit: Has Dressing in Place as Prescribed: Yes Has Footwear/Offloading in Place as Prescribed: Yes Left: Total Contact Cast Pain Present Now: No Electronic Signature(s) Signed: 08/24/2018 4:42:57 PM By: Lorine Bears RCP, RRT, CHT Entered By: Lorine Bears on 08/24/2018 09:09:10 [name] ([id number]) -------------------------------------------------------------------------------- Encounter Discharge Information Details Patient Name: Heather Dear A. Date of Service: 08/24/2018 9:00 AM Medical Record Number: [id number] Patient Account Number: [id number] Date of Birth/Sex: 1976-01-13 (42 y.o. F) Treating RN: Cornell Barman Primary Care Lakima Dona: Priscille Kluver Other Clinician: Referring Brynne Doane: Priscille Kluver Treating Clayborn Milnes/Extender: Melburn Hake, HOYT Weeks in Treatment: 2 Encounter Discharge Information  Items Discharge Condition: Stable Ambulatory Status: Wheelchair Discharge Destination: Home Transportation: Private Auto Accompanied By: father Schedule Follow-up Appointment: Yes Clinical Summary of Care: Electronic Signature(s) Signed: 08/24/2018 3:46:30 PM By: Gretta Cool, BSN, RN, CWS, [name] RN, BSN Entered By: Gretta Cool, BSN, RN, CWS, [name] on 08/24/2018 10:17:13 [name] ([id number]) -------------------------------------------------------------------------------- Lower Extremity Assessment Details Patient Name: Heather Dear A. Date of Service: 08/24/2018 9:00 AM Medical Record Number: [id number] Patient Account Number: [id number] Date of Birth/Sex: 1975-10-29 (42 y.o. F) Treating RN: Cornell Barman Primary Care Khalila Buechner: Priscille Kluver Other Clinician: Referring Makell Drohan: Priscille Kluver Treating Mycal Conde/Extender: STONE III, HOYT Weeks in Treatment: 2 Edema Assessment Assessed: [Left: No] [Right: No] Edema: [Left: N] [Right: o] Vascular Assessment Pulses: Dorsalis Pedis Palpable: [Left:Yes] Posterior Tibial Extremity colors, hair growth, and conditions: Extremity Color: [Left:Normal] Hair Growth on Extremity: [Left:Yes] Temperature of Extremity: [Left:Warm] Capillary Refill: [Left:< 3 seconds] Toe Nail Assessment Left: Right: Thick: No Discolored: No Deformed: No Improper Length and Hygiene: No Electronic Signature(s) Signed: 08/24/2018 3:46:30 PM By: Gretta Cool, BSN, RN, CWS, [name] RN, BSN Entered By: Gretta Cool, BSN, RN, CWS, [name] on 08/24/2018 09:45:45 [name] ([id number]) -------------------------------------------------------------------------------- Multi Wound Chart Details Patient Name: Heather Dear A. Date of Service: 08/24/2018 9:00 AM Medical Record Number: [id number] Patient Account Number: [id number] Date of Birth/Sex: 1976/02/26 (42 y.o. 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] [N/A:N/A] Comorbid History: [4:Chronic Obstructive Pulmonary Disease (COPD), Arrhythmia, Type II Diabetes, Neuropathy, Seizure Disorder, Confinement Anxiety] [N/A:N/A] Date Acquired: [4:07/24/2018] [N/A:N/A] Weeks of Treatment: [4:2] [N/A:N/A] Wound Status: [4:Open] [N/A:N/A] Pending Amputation on [4:Yes] [N/A:N/A] Presentation: Measurements L x W x D [4:0.3x0.3x0.1] [N/A:N/A] (cm)  Area (cm) : [4:0.071] [N/A:N/A] Volume (cm) : [4:0.007] [N/A:N/A] % Reduction in Area: [4:95.40%] [N/A:N/A] % Reduction in Volume: [4:95.50%] [N/A:N/A] Classification: [4:Grade 1] [N/A:N/A] Exudate Amount: [4:Medium] [N/A:N/A] Exudate Type: [4:Serous] [N/A:N/A] Exudate Color: [4:amber] [N/A:N/A] Wound Margin: [4:Flat and Intact] [N/A:N/A] Granulation Amount: [4:Large (67-100%)] [N/A:N/A] Granulation Quality: [4:Pink] [N/A:N/A] Necrotic Amount: [4:None Present (0%)] [N/A:N/A] Exposed Structures: [4:Fat Layer (Subcutaneous Tissue) Exposed: Yes Fascia: No Tendon: No Muscle: No Joint: No Bone: No] [N/A:N/A] Epithelialization: [4:Medium (34-66%)] [N/A:N/A] Periwound Skin Texture: Excoriation: No N/A N/A Induration: No Callus: No Crepitus: No Rash: No Scarring: No Periwound Skin Moisture: Maceration: No N/A N/A Dry/Scaly: No Periwound Skin Color: Atrophie Blanche: No N/A N/A Cyanosis: No Ecchymosis: No Erythema: No Hemosiderin Staining: No Mottled: No Pallor: No Rubor: No Temperature: Hot N/A N/A Tenderness on Palpation: No N/A N/A Wound Preparation: Ulcer Cleansing:  N/A N/A Rinsed/Irrigated with Saline Topical Anesthetic Applied: None Treatment Notes Electronic Signature(s) Signed: 08/24/2018 3:46:30 PM By: Gretta Cool, BSN, RN, CWS, [name] RN, BSN Entered By: Gretta Cool, BSN, RN, CWS, [name] on 08/24/2018 10:10:33 [name] ([id number]) -------------------------------------------------------------------------------- [place] Details Patient Name: Heather Dear A. Date of Service: 08/24/2018 9:00 AM Medical Record Number: [id number] Patient Account Number: [id number] Date of Birth/Sex: 10-30-1975 (42 y.o. F) Treating RN: Cornell Barman Primary Care Emile Kyllo: Priscille Kluver Other Clinician: Referring Declan Adamson: Priscille Kluver Treating Kayshaun Polanco/Extender: Melburn Hake, HOYT Weeks in Treatment: 2 Active Inactive ` Abuse / Safety / Falls / Self Care Management Nursing Diagnoses: History of Falls Goals: Patient will not experience any injury related to falls Date Initiated: 08/07/2018 Target Resolution Date: 10/19/2018 Goal Status: Active Interventions: Assess fall risk on admission and as needed Notes: ` Orientation to the Wound Care Program Nursing Diagnoses: Knowledge deficit related to the wound healing center program Goals: Patient/caregiver will verbalize understanding of the [place] Date Initiated: 08/07/2018 Target Resolution Date: 10/19/2018 Goal Status: Active Interventions: Provide education on orientation to the wound center Notes: ` Soft Tissue Infection Nursing Diagnoses: Impaired tissue integrity Goals: Patient will remain free of wound infection Date Initiated: 08/07/2018 Target Resolution Date: 10/19/2018 Goal Status: Active Interventions: Stthomas, Amorita A. ([id number]) Assess signs and symptoms of infection every visit Notes: ` Wound/Skin Impairment Nursing Diagnoses: Impaired tissue integrity Goals: Ulcer/skin breakdown will heal within 14 weeks Date Initiated: 08/07/2018 Target  Resolution Date: 10/19/2018 Goal Status: Active Interventions: Assess patient/caregiver ability to obtain necessary supplies Notes: Electronic Signature(s) Signed: 08/24/2018 3:46:30 PM By: Gretta Cool, BSN, RN, CWS, [name] RN, BSN Entered By: Gretta Cool, BSN, RN, CWS, [name] on 08/24/2018 10:10:26 [name] ([id number]) -------------------------------------------------------------------------------- Pain Assessment Details Patient Name: Heather Dear A. Date of Service: 08/24/2018 9:00 AM Medical Record Number: [id number] Patient Account Number: [id number] Date of Birth/Sex: [birth year] (42 y.o. F) Treating RN: Montey Hora Primary Care Neko Boyajian: Priscille Kluver Other Clinician: Referring Tonny Isensee: Priscille Kluver Treating Emilya Justen/Extender: STONE III, HOYT Weeks in Treatment: 2 Active Problems Location of Pain Severity and Description of Pain Patient Has Paino No Site Locations Pain Management and Medication Current Pain Management: Electronic Signature(s) Signed: 08/24/2018 4:42:57 PM By: Lorine Bears RCP, RRT, CHT Signed: 08/24/2018 5:38:25 PM By: Montey Hora Entered By: Lorine Bears on 08/24/2018 09:09:18 [name] ([id number]) -------------------------------------------------------------------------------- Patient/Caregiver Education Details Patient Name: Heather Dear A. Date of Service: 08/24/2018 9:00 AM Medical Record Number: [id number] Patient Account Number: [id number] Date of Birth/Gender: Aug 21, 1976 (42 y.o. F) Treating RN: Cornell Barman Primary Care Physician: Priscille Kluver Other Clinician: Referring Physician: Priscille Kluver Treating Physician/Extender: Melburn Hake, HOYT  Weeks in Treatment: 2 Education Assessment Education Provided To: Patient Education Topics Provided Offloading: Handouts: Other: TCC precautions Methods: Explain/Verbal Responses: State content correctly Electronic Signature(s) Signed: 08/24/2018 3:46:30 PM By: Gretta Cool,  BSN, RN, CWS, [name] RN, BSN Entered By: Gretta Cool, BSN, RN, CWS, [name] on 08/24/2018 10:16:32 [name] ([id number]) -------------------------------------------------------------------------------- Wound Assessment Details Patient Name: Heather Dear A. Date of Service: 08/24/2018 9:00 AM Medical Record Number: [id number] Patient Account Number: [id number] Date of Birth/Sex: [birth year] (42 y.o. F) Treating RN: Cornell Barman Primary Care Renleigh Ouellet: Priscille Kluver Other Clinician: Referring Brittanni Cariker: Priscille Kluver Treating Jamielyn Petrucci/Extender: STONE III, HOYT Weeks in Treatment: 2 Wound Status Wound Number: 4 Primary Diabetic Wound/Ulcer of the Lower Extremity Etiology: Wound Location: Left Toe Great Wound Open Wounding Event: Gradually Appeared Status: Date Acquired: 07/24/2018 Comorbid Chronic Obstructive Pulmonary Disease Weeks Of Treatment: 2 History: (COPD), Arrhythmia, Type II Diabetes, Clustered Wound: No Neuropathy, Seizure Disorder, Confinement Pending Amputation On Presentation Anxiety Photos Photo Uploaded By: Gretta Cool, BSN, RN, CWS, [name] on 08/24/2018 17:02:35 Wound Measurements Length: (cm) 0.3 Width: (cm) 0.3 Depth: (cm) 0.1 Area: (cm) 0.071 Volume: (cm) 0.007 % Reduction in Area: 95.4% % Reduction in Volume: 95.5% Epithelialization: Medium (34-66%) Tunneling: No Undermining: No Wound Description Classification: Grade 1 Foul Odor Wound Margin: Flat and Intact Slough/Fi Exudate Amount: Medium Exudate Type: Serous Exudate Color: amber After Cleansing: No brino No Wound Bed Granulation Amount: Large (67-100%) Exposed Structure Granulation Quality: Pink Fascia Exposed: No Necrotic Amount: None Present (0%) Fat Layer (Subcutaneous Tissue) Exposed: Yes Tendon Exposed: No Muscle Exposed: No Joint Exposed: No Bone Exposed: No [name], [name] ([id number]) Periwound Skin Texture Texture Color No Abnormalities Noted: No No Abnormalities Noted: No Callus:  No Atrophie Blanche: No Crepitus: No Cyanosis: No Excoriation: No Ecchymosis: No Induration: No Erythema: No Rash: No Hemosiderin Staining: No Scarring: No Mottled: No Pallor: No Moisture Rubor: No No Abnormalities Noted: No Dry / Scaly: No Temperature / Pain Maceration: No Temperature: Hot Wound Preparation Ulcer Cleansing: Rinsed/Irrigated with Saline Topical Anesthetic Applied: None Treatment Notes Wound #4 (Left Toe Great) 1. Cleansed with: Cleanse wound with antibacterial soap and water Notes Prisma Ag, Drawtex, foam and TCC applied in clinic today Electronic Signature(s) Signed: 08/24/2018 3:46:30 PM By: Gretta Cool, BSN, RN, CWS, [name] RN, BSN Entered By: Gretta Cool, BSN, RN, CWS, [name] on 08/24/2018 09:45:17 [name] ([id number]) -------------------------------------------------------------------------------- Vitals Details Patient Name: Heather Dear A. Date of Service: 08/24/2018 9:00 AM Medical Record Number: [id number] Patient Account Number: [id number] Date of Birth/Sex: 03/28/1976 (42 y.o. F) Treating RN: Montey Hora Primary Care Cane Dubray: Priscille Kluver Other Clinician: Referring Jeanett Antonopoulos: Priscille Kluver Treating Trell Secrist/Extender: STONE III, HOYT Weeks in Treatment: 2 Vital Signs Time Taken: 09:06 Temperature (F): 98.9 Height (in): 69 Pulse (bpm): 101 Weight (lbs): 240 Respiratory Rate (breaths/min): 16 Body Mass Index (BMI): 35.4 Blood Pressure (mmHg): 110/85 Reference Range: 80 - 120 mg / dl Electronic Signature(s) Signed: 08/24/2018 4:42:57 PM By: Lorine Bears RCP, RRT, CHT Entered By: Lorine Bears on 08/24/2018 09:11:38

## 2018-08-28 NOTE — Progress Notes (Signed)
[name], [name] ([id number]) Visit Report for 08/24/2018 Chief Complaint Document Details Patient Name: [name], [name] Date of Service: 08/24/2018 9:00 AM Medical Record Number: [id number] Patient Account Number: [id number] Date of Birth/Sex: 1975-11-08 (42 y.o. F) Treating RN: Montey Hora Primary Care Provider: Priscille Kluver Other Clinician: Referring Provider: Priscille Kluver Treating Provider/Extender: Melburn Hake, Jazmin Ley Weeks in Treatment: 2 Information Obtained from: Patient Chief Complaint She is here for evaluation of a left great toe ulcer Electronic Signature(s) Signed: 08/27/2018 9:27:58 AM By: Worthy Keeler PA-C Entered By: Worthy Keeler on 08/24/2018 09:31:31 [name] ([id number]) -------------------------------------------------------------------------------- HPI Details Patient Name: Heather Dear A. Date of Service: 08/24/2018 9:00 AM Medical Record Number: [id number] Patient Account Number: [id number] Date of Birth/Sex: 09-23-1976 (42 y.o. F) Treating RN: Montey Hora Primary Care Provider: Priscille Kluver Other Clinician: Referring Provider: Priscille Kluver Treating Provider/Extender: Melburn Hake, Thamas Appleyard Weeks in Treatment: 2 History of Present Illness HPI Description: 01/18/18-She is here for initial evaluation of the left great toe ulcer. She is a poor historian in regards to timeframe in detail. She states approximately 4 weeks ago she lacerated her toe on something in the house. She followed up with her primary care who placed her on Bactrim and ultimately a second dose of Bactrim prior to coming to wound clinic. She states she has been treating the toe with peroxide, Betadine and a Band-Aid. She did not check her blood sugar this morning but checked it yesterday morning it was 327; she is unaware of a recent A1c and there are no current records. She saw Dr. she would've orthopedics last week for an old injury to the left ankle, she states he did not see her toe,  nor did she bring it to his attention. She smokes approximately 1 pack cigarettes a day. Her social situation is concerning, she arrives this morning with her mother who appears extremely intoxicated/under the influence; her mother was asked to leave the room and be monitored by the patient's grandmother. The patient's aunt then accompanied the patient and the room throughout the rest of the appointment. We had a lengthy discussion regarding the deleterious effects of uncontrolled hyperglycemia and smoking as it relates to wound healing and overall health. She was strongly encouraged to decrease her smoking and get her diabetes under better control. She states she is currently on a diet and has cut down her [facility] consumption. The left toe is erythematous, macerated and slightly edematous with malodor present. The edema in her left foot is below her baseline, there is no erythema streaking. We will treat her with Santyl, doxycycline; we have ordered and xray, culture and provided a Peg assist surgical shoe and cultured the wound. 01/25/18-She is here in follow-up evaluation for a left great toe ulcer and presents with an abscess to her suprapubic area. She states her blood sugars remain elevated, feeling "sick" and if levels are below 250, but she is trying. She has made no attempt to decrease her smoking stating that we "can't take away her food in her cigarettes". She has been compliant with offloading using the PEG assist you. She is using Santyl daily. the culture obtained last week grew staph aureus and Enterococcus faecalis; continues on the doxycycline and Augmentin was added on Monday. The suprapubic area has erythema, no femoral variation, purple discoloration, minimal induration, was accessed with a cotton tip applicator with sanguinopurulent drainage, this was cultured, I suspect the current antibiotic treatment will cover and we will not add anything to  her current treatment plan.  She was advised to go to urgent care or ER with any change in redness, induration or fever. 02/01/18-She is here in follow-up evaluation for left great toe ulcers and a new abdominal abscess from last week. She was able to use packing until earlier this week, where she "forgot it was there". She states she was feeling ill with GI symptoms last week and was not taking her antibiotic. She states her glucose levels have been predominantly less than 200, with occasional levels between 200-250. She thinks this was contributing to her GI symptoms as they have resolved without intervention. There continues to be significant laceration to left toe, otherwise it clinically looks stable/improved. There is now less superficial opening to the lateral aspect of the great toe that was residual blister. We will transition to [facility] to all wounds, she will continue her Augmentin. If there is no change or deterioration next week for reculture. 02/08/18-She is here in follow-up evaluation for left great toe ulcer and abdominal ulcer. There is an improvement in both wounds. She has been wrapping her left toe with coban, not by our direction, which has created an area of discoloration to the medial aspect; she has been advised to NOT use coban secondary to her neuropathy. She states her glucose levels have been high over this last week ranging from 200-350, she continues to smoke. She admits to being less compliant with her offloading shoe. We will continue with same treatment plan and she will follow-up next week. 02/15/18-She is here in follow-up evaluation for left great toe ulcer and abdominal ulcer. The abdominal ulcer is epithelialized. The left great toe ulcer is improved and all injury from last week using the Coban wrap is resolved, the lateral ulcer is healed. She admits to noncompliance with wearing offloading shoe and admits to glucose levels being greater than 300 most of the week. She continues to  smoke and expresses no desire to quit. There is one area medially that probes deeper than it has historically, erythema to the toe and dorsal foot has consistently waxed and waned. There is no overt signs of cellulitis or infection but we will culture the wound for any occult infection given the new area of depth and erythema. We will hold off on sensitivities for initiation of antibiotic therapy. 02/22/18-She is here in follow up evaluation for left great toe ulcer. There is overall significant improvement in both wound appearance, erythema and edema with changes made last week. She was not initiated on antibiotic therapy. Culture obtained last week showed oxacillin sensitive staph aureus, sensitive to clindamycin. Clindamycin has been called into the pharmacy but she has been instructed to hold off on initiation secondary to overall clinical improvement and her history of antibiotic intolerance. She has been instructed to contact the clinic with any noted changes/deterioration and the wound, erythema, [name], [name] ([id number]) edema and/or pain. She will follow-up next week. She continues to smoke and her glucose levels remain elevated >250; she admits to compliance with offloading shoe 03/01/18 on evaluation today patient appears to be doing fairly well in regard to her left first toe ulcer. She has been tolerating the dressing changes with the [facility] Dressing without complication and overall this has definitely showed signs of improvement according to records as well is what the patient tells me today. I'm very pleased in that regard. She is having no pain today 03/08/18 She is here for follow up evaluation of a left great toe  ulcer. She remains non-compliant with glucose control and smoking cessation; glucose levels consistently >200. She states that she got new shoe inserts/peg assist. She admits to compliance with offloading. Since my last evaluation there is significant improvement.  We will switch to prisma at this time and she will follow up next week. She is noted to be tachycardic at this appointment, heart rate 120s; she has a history of heart rate 70-130 according to our records. She admits to extreme agitation r/t personal issues; she was advised to monitor her heartrate and contact her physician if it does not return to a more normal range (<100). She takes cardizem twice daily. 03/15/18-She is here in follow-up evaluation for left great toe ulcer. She remains noncompliant with glucose control and smoking cessation. She admits to compliance with wearing offloading shoe. The ulcer is improved/stable and we will continue with the same treatment plan and she will follow-up next week 03/22/18-She is here for evaluation for left great toe ulcer. There continues to be significant improvement despite recurrent hyperglycemia (over 500 yesterday) and she continues to smoke. She has been compliant with offloading and we will continue with same treatment plan and she will follow-up next week. 03/29/18-She is here for evaluation for left great toe ulcer. Despite continuing to smoke and uncontrolled diabetes she continues to improve. She is compliant with offloading shoe. We will continue with the same treatment plan and she will follow-up next week 04/05/18- She is here in follow up evaluation for a left great toe ulcer; she presents with small pustule to left fifth toe (resembles ant bite). She admits to compliance with wearing offloading shoe; continues to smoke or have uncontrolled blood glucose control. There is more callus than usual with evidence of bleeding; she denies known trauma. 04/12/18-She is here for evaluation of left great toe ulcer. Despite noncompliance with glycemic control and smoking she continues to make improvement. She continues to wear offloading shoe. The pustule, that was identified last week, to the left fifth toe is resolved. She will follow-up in 2  weeks 05/03/18-she is seen in follow-up evaluation for a left great toe ulcer. She is compliant with offloading, otherwise noncompliant with glycemic control and smoking. She has plateaued and there is minimal improvement noted. We will transition to [facility], replaced the insert to her surgical shoe and she will follow-up in one week 05/10/18- She is here in follow up evaluation for a left great toe ulcer. It appears stable despite measurement change. We will continue with same treatment plan and follow up next week. 05/24/18-She is seen in follow-up evaluation for a left great toe ulcer. She remains compliant with offloading, has made significant improvement in her diet, decreasing the amount of sugar/soda. She said her recent A1c was 10.9 which is lower than. She did see a diabetic nutritionist/educator yesterday. She continues to smoke. We will continue with the same treatment plan and she'll follow-up next week. 05/31/18- She is seen in follow-up evaluation for left great toe ulcer. She continues to remain compliant with offloading, continues to make improvement in her diet, increasing her water and decreasing the amount of sugar/soda. She does continue to smoke with no desire to quit. We will apply Prisma to the depth and Hydrofera Blue over. We have not received insurance authorization for oasis. She will follow up next week. 06/07/18-She is seen in follow-up evaluation for left great toe ulcer. It has stalled according to today's measurements although base appears stable. She says she saw a diabetic  educator yesterday; her average blood sugars are less than 300 which is an improvement for her. She continues to smoke and states "that's my next step" She continues with water over soda. We will order for xray, culture and reinstate ace wrap compression prior to placing apligraf for next week. She is voicing no complaints or concerns. Her dressing will change to iodoflex over the next week in  preparation for apligraf. 06/14/18-She is seen in follow-up evaluation for left great toe ulcer. Plain film x-ray performed last week was negative for osteomyelitis. Wound culture obtained last week grew strep B and OSSA; she is initiated on keflex and cefdinir today; there is erythema to the toe which could be from ace wrap compression, she has a history of wrapping too tight and has has been encouraged to maintain ace wraps that we place today. We will hold off on application of apligraf today, will apply next week after antibiotic therapy has been initiated. She admits today that she has resumed taking a shower with her foot/toe submerged in water, she has been reminded to keep foot/toe out of the bath water. She will be seen in follow up next week 06/21/18-she is seen in follow-up evaluation for left great toe ulcer. She is tolerating antibiotic therapy with no GI disturbance. The wound is stable. Apligraf was applied today. She has been decreasing her smoking, only had 4 cigarettes yesterday and 1 today. She continues being more compliant in diabetic diet. She will follow-up next week for evaluation of site, if stable will remove at 2 weeks. 06/28/18- She is here in follow up evalution. Apligraf was placed last week, she states the dressing fell off on Tuesday and she was dressing with hydrofera blue. She is healed and will be discharged from the clinic today. She has been instructed to continue with smoking cessation, continue monitoring glucose levels, offloading for an additional 4 weeks and continue with Mccormick, [name] ([id number]) hydrofera blue for additional two weeks for any possible microscopic opening. Readmission: 08/07/18 on evaluation today patient presents for reevaluation concerning the ulcer of her right great toe. She was previously discharged on 06/28/18 healed. Nonetheless she states that this began to show signs of drainage she subsequently went to her primary care provider.  Subsequently an x-ray was performed on 08/01/18 which was negative. The patient was also placed on antibiotics at that time. Fortunately they should have been effective for the infection. Nonetheless she's been experiencing some improvement but still has a lot of drainage coming from the wound itself. 08/14/18 on evaluation today patient's wound actually does show signs of improvement in regard to the erythema at this point. She has completed the antibiotics. With that being said we did discuss the possibility of placing her in a total contact cast as of today although I think that I may want to give this just a little bit more time to ensure nothing recurrence as far as her infection is concerned. I do not want to put in the cast and risk infection at that time if things are not completely resolved. With that being said she is gonna require some debridement today. 08/21/18 on evaluation today patient actually appears to be doing okay in regard to her toe ulcer. She's been tolerating the dressing changes without complication. With that being said it does appear that she is ready and in fact I think it's appropriate for [place] to go ahead and initiate the total contact cast today. Nonetheless she will require some sharp debridement  to prepare the wound for application. Overall I feel like things have been progressing well but we do need to do something to get this to close more readily. 08/24/18 patient seen today for reevaluation after having had the total contact cast applied on Tuesday. She seems to have done very well the wound appears to be doing great and overall I'm pleased with the progress that she's made. There were no abnormal areas of rubbing from the cast on her lower extremity. Electronic Signature(s) Signed: 08/27/2018 9:27:58 AM By: Worthy Keeler PA-C Entered By: Worthy Keeler on 08/24/2018 13:28:36 [name]  ([id number]) -------------------------------------------------------------------------------- Physical Exam Details Patient Name: Heather Dear A. Date of Service: 08/24/2018 9:00 AM Medical Record Number: [id number] Patient Account Number: [id number] Date of Birth/Sex: 28-Aug-1976 (42 y.o. F) Treating RN: Montey Hora Primary Care Provider: Priscille Kluver Other Clinician: Referring Provider: Priscille Kluver Treating Provider/Extender: STONE III, Juniper Cobey Weeks in Treatment: 2 Constitutional Well-nourished and well-hydrated in no acute distress. Respiratory normal breathing without difficulty. clear to auscultation bilaterally. Cardiovascular regular rate and rhythm with normal S1, S2. Psychiatric this patient is able to make decisions and demonstrates good insight into disease process. Alert and Oriented x 3. pleasant and cooperative. Notes At this point the patient's wound has done excellent. No debridement performed at this point in time today. We did go ahead and we applied the total contact cast today. She tolerated this without complication. 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 Nonetheless she does in the end tolerated and hopefully she will not need to work for long in order to get this area to heal and do well. Electronic Signature(s) Signed: 08/27/2018 9:27:58 AM By: Worthy Keeler PA-C Entered By: Worthy Keeler on 08/24/2018 13:29:31 [name] ([id number]) -------------------------------------------------------------------------------- Physician Orders Details Patient Name: Heather Dear A. Date of Service: 08/24/2018 9:00 AM Medical Record Number: [id number] Patient Account Number: [id number] Date of Birth/Sex: 12-02-1975 (42 y.o. F) Treating RN: Cornell Barman Primary Care Provider: Priscille Kluver Other Clinician: Referring Provider: Priscille Kluver Treating Provider/Extender: Melburn Hake, Shantella Blubaugh Weeks in Treatment: 2 Verbal  / Phone Orders: No Diagnosis Coding ICD-10 Coding Code Description E11.621 Type 2 diabetes mellitus with foot ulcer L03.032 Cellulitis of left toe L97.522 Non-pressure chronic ulcer of other part of left foot with fat layer exposed Wound Cleansing Wound #4 Left Toe Great o Clean wound with Normal Saline. Anesthetic (add to Medication List) Wound #4 Left Toe Great o Topical Lidocaine 4% cream applied to wound bed prior to debridement (In Clinic Only). Primary Wound Dressing Wound #4 Left Toe Great o Silver Collagen o Drawtex Secondary Dressing Wound #4 Left Toe Great o Foam Dressing Change Frequency Wound #4 Left Toe Great o Other: - Friday and Tuesday 08/28/2018 Follow-up Appointments Wound #4 Left Toe Great o Return Appointment in 1 week. o Other: - Friday and then Tuesday 08/28/2018 Off-Loading Wound #4 Left Toe Great o Total Contact Cast to Left Lower Extremity Electronic Signature(s) Signed: 08/24/2018 3:46:30 PM By: Gretta Cool, BSN, RN, CWS, [name] RN, BSN Signed: 08/27/2018 9:27:58 AM By: Ronn Melena, Idelle A. ([id number]) Entered By: Gretta Cool, BSN, RN, CWS, [name] on 08/24/2018 10:11:14 [name] ([id number]) -------------------------------------------------------------------------------- Problem List Details Patient Name: Heather Dear A. Date of Service: 08/24/2018 9:00 AM Medical Record Number: [id number] Patient Account Number: [id number] Date of Birth/Sex: August 24, 1976 (42 y.o. F) Treating RN: Montey Hora Primary Care Provider: Priscille Kluver Other Clinician: Referring Provider:  DAVIS, DEBRA Treating Provider/Extender: STONE III, Myleen Brailsford Weeks in Treatment: 2 Active Problems ICD-10 Evaluated Encounter Code Description Active Date Today Diagnosis E11.621 Type 2 diabetes mellitus with foot ulcer 08/07/2018 No Yes L03.032 Cellulitis of left toe 08/07/2018 No Yes L97.522 Non-pressure chronic ulcer of other part of left foot with fat  08/08/2018 No Yes layer exposed Inactive Problems Resolved Problems Electronic Signature(s) Signed: 08/27/2018 9:27:58 AM By: Worthy Keeler PA-C Entered By: Worthy Keeler on 08/24/2018 09:31:26 [name], [name] ([id number]) -------------------------------------------------------------------------------- Progress Note Details Patient Name: Heather Dear A. Date of Service: 08/24/2018 9:00 AM Medical Record Number: [id number] Patient Account Number: [id number] Date of Birth/Sex: 10-03-1976 (42 y.o. F) Treating RN: Montey Hora Primary Care Provider: Priscille Kluver Other Clinician: Referring Provider: Priscille Kluver Treating Provider/Extender: Melburn Hake, Zailee Vallely Weeks in Treatment: 2 Subjective Chief Complaint Information obtained from Patient She is here for evaluation of a left great toe ulcer History of Present Illness (HPI) 01/18/18-She is here for initial evaluation of the left great toe ulcer. She is a poor historian in regards to timeframe in detail. She states approximately 4 weeks ago she lacerated her toe on something in the house. She followed up with her primary care who placed her on Bactrim and ultimately a second dose of Bactrim prior to coming to wound clinic. She states she has been treating the toe with peroxide, Betadine and a Band-Aid. She did not check her blood sugar this morning but checked it yesterday morning it was 327; she is unaware of a recent A1c and there are no current records. She saw Dr. she would've orthopedics last week for an old injury to the left ankle, she states he did not see her toe, nor did she bring it to his attention. She smokes approximately 1 pack cigarettes a day. Her social situation is concerning, she arrives this morning with her mother who appears extremely intoxicated/under the influence; her mother was asked to leave the room and be monitored by the patient's grandmother. The patient's aunt then accompanied the patient and the room  throughout the rest of the appointment. We had a lengthy discussion regarding the deleterious effects of uncontrolled hyperglycemia and smoking as it relates to wound healing and overall health. She was strongly encouraged to decrease her smoking and get her diabetes under better control. She states she is currently on a diet and has cut down her [facility] consumption. The left toe is erythematous, macerated and slightly edematous with malodor present. The edema in her left foot is below her baseline, there is no erythema streaking. We will treat her with Santyl, doxycycline; we have ordered and xray, culture and provided a Peg assist surgical shoe and cultured the wound. 01/25/18-She is here in follow-up evaluation for a left great toe ulcer and presents with an abscess to her suprapubic area. She states her blood sugars remain elevated, feeling "sick" and if levels are below 250, but she is trying. She has made no attempt to decrease her smoking stating that we "can't take away her food in her cigarettes". She has been compliant with offloading using the PEG assist you. She is using Santyl daily. the culture obtained last week grew staph aureus and Enterococcus faecalis; continues on the doxycycline and Augmentin was added on Monday. The suprapubic area has erythema, no femoral variation, purple discoloration, minimal induration, was accessed with a cotton tip applicator with sanguinopurulent drainage, this was cultured, I suspect the current antibiotic treatment will cover and we will not add  anything to her current treatment plan. She was advised to go to urgent care or ER with any change in redness, induration or fever. 02/01/18-She is here in follow-up evaluation for left great toe ulcers and a new abdominal abscess from last week. She was able to use packing until earlier this week, where she "forgot it was there". She states she was feeling ill with GI symptoms last week and was not taking  her antibiotic. She states her glucose levels have been predominantly less than 200, with occasional levels between 200-250. She thinks this was contributing to her GI symptoms as they have resolved without intervention. There continues to be significant laceration to left toe, otherwise it clinically looks stable/improved. There is now less superficial opening to the lateral aspect of the great toe that was residual blister. We will transition to [facility] to all wounds, she will continue her Augmentin. If there is no change or deterioration next week for reculture. 02/08/18-She is here in follow-up evaluation for left great toe ulcer and abdominal ulcer. There is an improvement in both wounds. She has been wrapping her left toe with coban, not by our direction, which has created an area of discoloration to the medial aspect; she has been advised to NOT use coban secondary to her neuropathy. She states her glucose levels have been high over this last week ranging from 200-350, she continues to smoke. She admits to being less compliant with her offloading shoe. We will continue with same treatment plan and she will follow-up next week. 02/15/18-She is here in follow-up evaluation for left great toe ulcer and abdominal ulcer. The abdominal ulcer is epithelialized. The left great toe ulcer is improved and all injury from last week using the Coban wrap is resolved, the lateral ulcer is healed. She admits to noncompliance with wearing offloading shoe and admits to glucose levels being greater than 300 most of the week. She continues to smoke and expresses no desire to quit. There is one area medially that probes deeper than it has historically, erythema to the toe and dorsal foot has consistently waxed and waned. There is no overt signs of cellulitis or infection but we will culture the wound for any occult infection given the new area of depth and erythema. We will hold off on Werber, Arlyn A.  ([id number]) sensitivities for initiation of antibiotic therapy. 02/22/18-She is here in follow up evaluation for left great toe ulcer. There is overall significant improvement in both wound appearance, erythema and edema with changes made last week. She was not initiated on antibiotic therapy. Culture obtained last week showed oxacillin sensitive staph aureus, sensitive to clindamycin. Clindamycin has been called into the pharmacy but she has been instructed to hold off on initiation secondary to overall clinical improvement and her history of antibiotic intolerance. She has been instructed to contact the clinic with any noted changes/deterioration and the wound, erythema, edema and/or pain. She will follow-up next week. She continues to smoke and her glucose levels remain elevated >250; she admits to compliance with offloading shoe 03/01/18 on evaluation today patient appears to be doing fairly well in regard to her left first toe ulcer. She has been tolerating the dressing changes with the [facility] without complication and overall this has definitely showed signs of improvement according to records as well is what the patient tells me today. I'm very pleased in that regard. She is having no pain today 03/08/18 She is here for follow up evaluation of a left  great toe ulcer. She remains non-compliant with glucose control and smoking cessation; glucose levels consistently >200. She states that she got new shoe inserts/peg assist. She admits to compliance with offloading. Since my last evaluation there is significant improvement. We will switch to prisma at this time and she will follow up next week. She is noted to be tachycardic at this appointment, heart rate 120s; she has a history of heart rate 70-130 according to our records. She admits to extreme agitation r/t personal issues; she was advised to monitor her heartrate and contact her physician if it does not return to a more normal  range (<100). She takes cardizem twice daily. 03/15/18-She is here in follow-up evaluation for left great toe ulcer. She remains noncompliant with glucose control and smoking cessation. She admits to compliance with wearing offloading shoe. The ulcer is improved/stable and we will continue with the same treatment plan and she will follow-up next week 03/22/18-She is here for evaluation for left great toe ulcer. There continues to be significant improvement despite recurrent hyperglycemia (over 500 yesterday) and she continues to smoke. She has been compliant with offloading and we will continue with same treatment plan and she will follow-up next week. 03/29/18-She is here for evaluation for left great toe ulcer. Despite continuing to smoke and uncontrolled diabetes she continues to improve. She is compliant with offloading shoe. We will continue with the same treatment plan and she will follow-up next week 04/05/18- She is here in follow up evaluation for a left great toe ulcer; she presents with small pustule to left fifth toe (resembles ant bite). She admits to compliance with wearing offloading shoe; continues to smoke or have uncontrolled blood glucose control. There is more callus than usual with evidence of bleeding; she denies known trauma. 04/12/18-She is here for evaluation of left great toe ulcer. Despite noncompliance with glycemic control and smoking she continues to make improvement. She continues to wear offloading shoe. The pustule, that was identified last week, to the left fifth toe is resolved. She will follow-up in 2 weeks 05/03/18-she is seen in follow-up evaluation for a left great toe ulcer. She is compliant with offloading, otherwise noncompliant with glycemic control and smoking. She has plateaued and there is minimal improvement noted. We will transition to [facility], replaced the insert to her surgical shoe and she will follow-up in one week 05/10/18- She is here in follow  up evaluation for a left great toe ulcer. It appears stable despite measurement change. We will continue with same treatment plan and follow up next week. 05/24/18-She is seen in follow-up evaluation for a left great toe ulcer. She remains compliant with offloading, has made significant improvement in her diet, decreasing the amount of sugar/soda. She said her recent A1c was 10.9 which is lower than. She did see a diabetic nutritionist/educator yesterday. She continues to smoke. We will continue with the same treatment plan and she'll follow-up next week. 05/31/18- She is seen in follow-up evaluation for left great toe ulcer. She continues to remain compliant with offloading, continues to make improvement in her diet, increasing her water and decreasing the amount of sugar/soda. She does continue to smoke with no desire to quit. We will apply Prisma to the depth and Hydrofera Blue over. We have not received insurance authorization for oasis. She will follow up next week. 06/07/18-She is seen in follow-up evaluation for left great toe ulcer. It has stalled according to today's measurements although base appears stable. She says she saw  a diabetic educator yesterday; her average blood sugars are less than 300 which is an improvement for her. She continues to smoke and states "that's my next step" She continues with water over soda. We will order for xray, culture and reinstate ace wrap compression prior to placing apligraf for next week. She is voicing no complaints or concerns. Her dressing will change to iodoflex over the next week in preparation for apligraf. 06/14/18-She is seen in follow-up evaluation for left great toe ulcer. Plain film x-ray performed last week was negative for osteomyelitis. Wound culture obtained last week grew strep B and OSSA; she is initiated on keflex and cefdinir today; there is erythema to the toe which could be from ace wrap compression, she has a history of wrapping too  tight and has has been encouraged to maintain ace wraps that we place today. We will hold off on application of apligraf today, will apply next week after antibiotic therapy has been initiated. She admits today that she has resumed taking a shower with her foot/toe submerged in water, she has been reminded to keep foot/toe out of the bath water. She will be seen in follow up next week 06/21/18-she is seen in follow-up evaluation for left great toe ulcer. She is tolerating antibiotic therapy with no GI disturbance. The wound is stable. Apligraf was applied today. She has been decreasing her smoking, only had 4 cigarettes yesterday and [name], [name] ([id number]) 1 today. She continues being more compliant in diabetic diet. She will follow-up next week for evaluation of site, if stable will remove at 2 weeks. 06/28/18- She is here in follow up evalution. Apligraf was placed last week, she states the dressing fell off on Tuesday and she was dressing with hydrofera blue. She is healed and will be discharged from the clinic today. She has been instructed to continue with smoking cessation, continue monitoring glucose levels, offloading for an additional 4 weeks and continue with hydrofera blue for additional two weeks for any possible microscopic opening. Readmission: 08/07/18 on evaluation today patient presents for reevaluation concerning the ulcer of her right great toe. She was previously discharged on 06/28/18 healed. Nonetheless she states that this began to show signs of drainage she subsequently went to her primary care provider. Subsequently an x-ray was performed on 08/01/18 which was negative. The patient was also placed on antibiotics at that time. Fortunately they should have been effective for the infection. Nonetheless she's been experiencing some improvement but still has a lot of drainage coming from the wound itself. 08/14/18 on evaluation today patient's wound actually does show signs of  improvement in regard to the erythema at this point. She has completed the antibiotics. With that being said we did discuss the possibility of placing her in a total contact cast as of today although I think that I may want to give this just a little bit more time to ensure nothing recurrence as far as her infection is concerned. I do not want to put in the cast and risk infection at that time if things are not completely resolved. With that being said she is gonna require some debridement today. 08/21/18 on evaluation today patient actually appears to be doing okay in regard to her toe ulcer. She's been tolerating the dressing changes without complication. With that being said it does appear that she is ready and in fact I think it's appropriate for [place] to go ahead and initiate the total contact cast today. Nonetheless she will require some  sharp debridement to prepare the wound for application. Overall I feel like things have been progressing well but we do need to do something to get this to close more readily. 08/24/18 patient seen today for reevaluation after having had the total contact cast applied on Tuesday. She seems to have done very well the wound appears to be doing great and overall I'm pleased with the progress that she's made. There were no abnormal areas of rubbing from the cast on her lower extremity. Patient History Information obtained from Patient. Family History Cancer - Paternal Grandparents, Heart Disease - Paternal Grandparents, Hypertension - Paternal Grandparents, No family history of Diabetes, Hereditary Spherocytosis, Kidney Disease, Lung Disease, Seizures, Stroke, Thyroid Problems, Tuberculosis. Social History Current every day smoker, Marital Status - Single, Alcohol Use - Never, Drug Use - Prior History, Caffeine Use - Daily. Review of Systems (ROS) Constitutional Symptoms (General Health) Denies complaints or symptoms of Fever, Chills. Respiratory The patient  has no complaints or symptoms. Cardiovascular The patient has no complaints or symptoms. Psychiatric The patient has no complaints or symptoms. [name], [name] ([id number]) Objective Constitutional Well-nourished and well-hydrated in no acute distress. Vitals Time Taken: 9:06 AM, Height: 69 in, Weight: 240 lbs, BMI: 35.4, Temperature: 98.9 F, Pulse: 101 bpm, Respiratory Rate: 16 breaths/min, Blood Pressure: 110/85 mmHg. Respiratory normal breathing without difficulty. clear to auscultation bilaterally. Cardiovascular regular rate and rhythm with normal S1, S2. Psychiatric this patient is able to make decisions and demonstrates good insight into disease process. Alert and Oriented x 3. pleasant and cooperative. General Notes: At this point the patient's wound has done excellent. No debridement performed at this point in time today. We did go ahead and we applied the total contact cast today. She tolerated this without complication. 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 Nonetheless she does in the end tolerated and hopefully she will not need to work for long in order to get this area to heal and do well. Integumentary (Hair, Skin) Wound #4 status is Open. Original cause of wound was Gradually Appeared. The wound is located on the Left Toe Great. The wound measures 0.3cm length x 0.3cm width x 0.1cm depth; 0.071cm^2 area and 0.007cm^3 volume. There is Fat Layer (Subcutaneous Tissue) Exposed exposed. There is no tunneling or undermining noted. There is a medium amount of serous drainage noted. The wound margin is flat and intact. There is large (67-100%) pink granulation within the wound bed. There is no necrotic tissue within the wound bed. The periwound skin appearance did not exhibit: Callus, Crepitus, Excoriation, Induration, Rash, Scarring, Dry/Scaly, Maceration, Atrophie Blanche, Cyanosis, Ecchymosis, Hemosiderin Staining,  Mottled, Pallor, Rubor, Erythema. Periwound temperature was noted as Hot. Assessment Active Problems ICD-10 Type 2 diabetes mellitus with foot ulcer Cellulitis of left toe Non-pressure chronic ulcer of other part of left foot with fat layer exposed Procedures Wound #4 Pre-procedure diagnosis of Wound #4 is a Diabetic Wound/Ulcer of the Lower Extremity located on the Left Toe Great . There [name], [name] ([id number]) was a Total Contact Cast Procedure by STONE III, Stephaney Steven E., PA-C. Post procedure Diagnosis Wound #4: Same as Pre-Procedure Plan Wound Cleansing: Wound #4 Left Toe Great: Clean wound with Normal Saline. Anesthetic (add to Medication List): Wound #4 Left Toe Great: Topical Lidocaine 4% cream applied to wound bed prior to debridement (In Clinic Only). Primary Wound Dressing: Wound #4 Left Toe Great: Silver Collagen Drawtex Secondary Dressing: Wound #4 Left Toe Great:  Foam Dressing Change Frequency: Wound #4 Left Toe Great: Other: - Friday and Tuesday 08/28/2018 Follow-up Appointments: Wound #4 Left Toe Great: Return Appointment in 1 week. Other: - Friday and then Tuesday 08/28/2018 Off-Loading: Wound #4 Left Toe Great: Total Contact Cast to Left Lower Extremity We did go ahead and continue with the total contact cast application at this point. With that being said we will see her back for reevaluation next week to see were things stand. She has any concerns or questions in the meantime she will contact the office and let me know otherwise my hope is that this may be closed even by that time if it's not then we may need to have one more week of the total contact cast we shall see. Please see above for specific wound care orders. We will see patient for re-evaluation in 1 week(s) here in the clinic. If anything worsens or changes patient will contact our office for additional recommendations. Electronic Signature(s) Signed: 08/27/2018 9:27:58 AM By: Worthy Keeler  PA-C Entered By: Worthy Keeler on 08/24/2018 13:30:15 [name] ([id number]) -------------------------------------------------------------------------------- ROS/PFSH Details Patient Name: Heather Dear A. Date of Service: 08/24/2018 9:00 AM Medical Record Number: [id number] Patient Account Number: [id number] Date of Birth/Sex: April 26, 1976 (42 y.o. F) Treating RN: Montey Hora Primary Care Provider: Priscille Kluver Other Clinician: Referring Provider: Priscille Kluver Treating Provider/Extender: STONE III, Stephens Shreve Weeks in Treatment: 2 Information Obtained From Patient Wound History Do you currently have one or more open woundso Yes How many open wounds do you currently haveo 1 Approximately how long have you had your woundso 1 How have you been treating your wound(s) until nowo murpiricin Has your wound(s) ever healed and then re-openedo Yes Have you had any lab work done in the past montho Yes Have you tested positive for an antibiotic resistant organism (MRSA, VRE)o Yes Have you tested positive for osteomyelitis (bone infection)o No Have you had any tests for circulation on your legso No Constitutional Symptoms (General Health) Complaints and Symptoms: Negative for: Fever; Chills Eyes Medical History: Negative for: Cataracts; Glaucoma; Optic Neuritis Ear/Nose/Mouth/Throat Medical History: Negative for: Chronic sinus problems/congestion; Middle ear problems Hematologic/Lymphatic Medical History: Negative for: Anemia; Hemophilia; Human Immunodeficiency Virus; Lymphedema; Sickle Cell Disease Respiratory Complaints and Symptoms: No Complaints or Symptoms Medical History: Positive for: Chronic Obstructive Pulmonary Disease (COPD) Negative for: Aspiration; Asthma; Pneumothorax; Sleep Apnea; Tuberculosis Cardiovascular Complaints and Symptoms: No Complaints or Symptoms Medical History: Positive for: Arrhythmia Negative for: Angina; Congestive Heart Failure; Coronary Artery  Disease; Deep Vein Thrombosis; Hypertension; [name], [name] ([id number]) Hypotension; Myocardial Infarction; Peripheral Arterial Disease; Peripheral Venous Disease; Phlebitis; Vasculitis Gastrointestinal Medical History: Negative for: Cirrhosis ; Colitis; Crohnos; Hepatitis A; Hepatitis B; Hepatitis C Endocrine Medical History: Positive for: Type II Diabetes Negative for: Type I Diabetes Time with diabetes: 10 years Treated with: Insulin, Oral agents Blood sugar tested every day: Yes Tested : 2 time daily Blood sugar testing results: Breakfast: 250 Genitourinary Medical History: Negative for: End Stage Renal Disease Immunological Medical History: Negative for: Lupus Erythematosus; Raynaudos; Scleroderma Integumentary (Skin) Medical History: Negative for: History of Burn; History of pressure wounds Musculoskeletal Medical History: Negative for: Gout; Rheumatoid Arthritis; Osteoarthritis; Osteomyelitis Neurologic Medical History: Positive for: Neuropathy - legs and feet; Seizure Disorder Oncologic Medical History: Negative for: Received Chemotherapy; Received Radiation Psychiatric Complaints and Symptoms: No Complaints or Symptoms Medical History: Positive for: Confinement Anxiety Negative for: Anorexia/bulimia Immunizations [name], [name] ([id number]) Pneumococcal Vaccine: Received Pneumococcal Vaccination: Yes Immunization Notes: up to  date Implantable Devices Family and Social History Cancer: Yes - Paternal Grandparents; Diabetes: No; Heart Disease: Yes - Paternal Grandparents; Hereditary Spherocytosis: No; Hypertension: Yes - Paternal Grandparents; Kidney Disease: No; Lung Disease: No; Seizures: No; Stroke: No; Thyroid Problems: No; Tuberculosis: No; Current every day smoker; Marital Status - Single; Alcohol Use: Never; Drug Use: Prior History; Caffeine Use: Daily; Financial Concerns: No; Food, Clothing or Shelter Needs: No; Support System Lacking:  No; Transportation Concerns: No; Advanced Directives: No; Patient does not want information on Advanced Directives; Living Will: No; Medical Power of Attorney: No Physician Affirmation I have reviewed and agree with the above information. Electronic Signature(s) Signed: 08/24/2018 5:38:25 PM By: Montey Hora Signed: 08/27/2018 9:27:58 AM By: Worthy Keeler PA-C Entered By: Worthy Keeler on 08/24/2018 13:28:51 [name], [name] Kitchen ([id number]) -------------------------------------------------------------------------------- Total Contact Cast Details Patient Name: Heather Dear A. Date of Service: 08/24/2018 9:00 AM Medical Record Number: [id number] Patient Account Number: [id number] Date of Birth/Sex: 1976-04-10 (43 y.o. F) Treating RN: Cornell Barman Primary Care Provider: Priscille Kluver Other Clinician: Referring Provider: Priscille Kluver Treating Provider/Extender: Melburn Hake, Ritika Hellickson Weeks in Treatment: 2 Total Contact Cast Applied for Wound Assessment: Wound #4 Left Toe Great Performed By: Physician Emilio Math., PA-C Post Procedure Diagnosis Same as Pre-procedure Electronic Signature(s) Signed: 08/24/2018 3:46:30 PM By: Gretta Cool, BSN, RN, CWS, [name] RN, BSN Signed: 08/27/2018 9:27:58 AM By: Worthy Keeler PA-C Entered By: Gretta Cool, BSN, RN, CWS, [name] on 08/24/2018 10:10:49 [name] ([id number]) -------------------------------------------------------------------------------- SuperBill Details Patient Name: Heather Dear A. Date of Service: 08/24/2018 Medical Record Number: [id number] Patient Account Number: [id number] Date of Birth/Sex: Sep 20, 1976 (42 y.o. F) Treating RN: Cornell Barman Primary Care Provider: Priscille Kluver Other Clinician: Referring Provider: Priscille Kluver Treating Provider/Extender: Melburn Hake, Ramah Langhans Weeks in Treatment: 2 Diagnosis Coding ICD-10 Codes Code Description E11.621 Type 2 diabetes mellitus with foot ulcer L03.032 Cellulitis of left toe L97.522 Non-pressure  chronic ulcer of other part of left foot with fat layer exposed Facility Procedures CPT4 Code: 18841660 Description: 29445 - APPLY TOTAL CONTACT LEG CAST ICD-10 Diagnosis Description E11.621 Type 2 diabetes mellitus with foot ulcer L03.032 Cellulitis of left toe L97.522 Non-pressure chronic ulcer of other part of left foot with fat Modifier: layer exposed Quantity: 1 Physician Procedures CPT4 Code: 6301601 Description: 09323 - WC PHYS APPLY TOTAL CONTACT CAST ICD-10 Diagnosis Description E11.621 Type 2 diabetes mellitus with foot ulcer L03.032 Cellulitis of left toe L97.522 Non-pressure chronic ulcer of other part of left foot with fat Modifier: layer exposed Quantity: 1 Electronic Signature(s) Signed: 08/27/2018 9:27:58 AM By: Worthy Keeler PA-C Entered By: Worthy Keeler on 08/24/2018 13:30:26

## 2018-08-30 ENCOUNTER — Encounter: Payer: Medicaid Other | Admitting: Physician Assistant

## 2018-08-30 DIAGNOSIS — L97522 Non-pressure chronic ulcer of other part of left foot with fat layer exposed: Secondary | ICD-10-CM | POA: Diagnosis not present

## 2018-08-31 ENCOUNTER — Ambulatory Visit: Payer: Self-pay | Admitting: Physician Assistant

## 2018-09-01 NOTE — Progress Notes (Signed)
[name] ([id number]) Visit Report for 08/30/2018 Chief Complaint Document Details Patient Name: [name], [name] Date of Service: 08/30/2018 12:30 PM Medical Record Number: [id number] Patient Account Number: [id number] Date of Birth/Sex: Aug 03, 1976 (42 y.o. F) Treating RN: Cornell Barman Primary Care Provider: Priscille Kluver Other Clinician: Referring Provider: Priscille Kluver Treating Provider/Extender: Melburn Hake, Kamelia Lampkins Weeks in Treatment: 3 Information Obtained from: Patient Chief Complaint She is here for evaluation of a left great toe ulcer Electronic Signature(s) Signed: 08/30/2018 5:47:48 PM By: Worthy Keeler PA-C Entered By: Worthy Keeler on 08/30/2018 12:55:31 [name] ([id number]) -------------------------------------------------------------------------------- HPI Details Patient Name: Heather Dear A. Date of Service: 08/30/2018 12:30 PM Medical Record Number: [id number] Patient Account Number: [id number] Date of Birth/Sex: 1976/02/19 (42 y.o. F) Treating RN: Cornell Barman Primary Care Provider: Priscille Kluver Other Clinician: Referring Provider: Priscille Kluver Treating Provider/Extender: Melburn Hake, Nalani Andreen Weeks in Treatment: 3 History of Present Illness HPI Description: 01/18/18-She is here for initial evaluation of the left great toe ulcer. She is a poor historian in regards to timeframe in detail. She states approximately 4 weeks ago she lacerated her toe on something in the house. She followed up with her primary care who placed her on Bactrim and ultimately a second dose of Bactrim prior to coming to wound clinic. She states she has been treating the toe with peroxide, Betadine and a Band-Aid. She did not check her blood sugar this morning but checked it yesterday morning it was 327; she is unaware of a recent A1c and there are no current records. She saw Dr. she would've orthopedics last week for an old injury to the left ankle, she states he did not see her toe, nor  did she bring it to his attention. She smokes approximately 1 pack cigarettes a day. Her social situation is concerning, she arrives this morning with her mother who appears extremely intoxicated/under the influence; her mother was asked to leave the room and be monitored by the patient's grandmother. The patient's aunt then accompanied the patient and the room throughout the rest of the appointment. We had a lengthy discussion regarding the deleterious effects of uncontrolled hyperglycemia and smoking as it relates to wound healing and overall health. She was strongly encouraged to decrease her smoking and get her diabetes under better control. She states she is currently on a diet and has cut down her [facility] consumption. The left toe is erythematous, macerated and slightly edematous with malodor present. The edema in her left foot is below her baseline, there is no erythema streaking. We will treat her with Santyl, doxycycline; we have ordered and xray, culture and provided a Peg assist surgical shoe and cultured the wound. 01/25/18-She is here in follow-up evaluation for a left great toe ulcer and presents with an abscess to her suprapubic area. She states her blood sugars remain elevated, feeling "sick" and if levels are below 250, but she is trying. She has made no attempt to decrease her smoking stating that we "can't take away her food in her cigarettes". She has been compliant with offloading using the PEG assist you. She is using Santyl daily. the culture obtained last week grew staph aureus and Enterococcus faecalis; continues on the doxycycline and Augmentin was added on Monday. The suprapubic area has erythema, no femoral variation, purple discoloration, minimal induration, was accessed with a cotton tip applicator with sanguinopurulent drainage, this was cultured, I suspect the current antibiotic treatment will cover and we will not add anything to  her current treatment plan. She  was advised to go to urgent care or ER with any change in redness, induration or fever. 02/01/18-She is here in follow-up evaluation for left great toe ulcers and a new abdominal abscess from last week. She was able to use packing until earlier this week, where she "forgot it was there". She states she was feeling ill with GI symptoms last week and was not taking her antibiotic. She states her glucose levels have been predominantly less than 200, with occasional levels between 200-250. She thinks this was contributing to her GI symptoms as they have resolved without intervention. There continues to be significant laceration to left toe, otherwise it clinically looks stable/improved. There is now less superficial opening to the lateral aspect of the great toe that was residual blister. We will transition to [facility] to all wounds, she will continue her Augmentin. If there is no change or deterioration next week for reculture. 02/08/18-She is here in follow-up evaluation for left great toe ulcer and abdominal ulcer. There is an improvement in both wounds. She has been wrapping her left toe with coban, not by our direction, which has created an area of discoloration to the medial aspect; she has been advised to NOT use coban secondary to her neuropathy. She states her glucose levels have been high over this last week ranging from 200-350, she continues to smoke. She admits to being less compliant with her offloading shoe. We will continue with same treatment plan and she will follow-up next week. 02/15/18-She is here in follow-up evaluation for left great toe ulcer and abdominal ulcer. The abdominal ulcer is epithelialized. The left great toe ulcer is improved and all injury from last week using the Coban wrap is resolved, the lateral ulcer is healed. She admits to noncompliance with wearing offloading shoe and admits to glucose levels being greater than 300 most of the week. She continues to smoke  and expresses no desire to quit. There is one area medially that probes deeper than it has historically, erythema to the toe and dorsal foot has consistently waxed and waned. There is no overt signs of cellulitis or infection but we will culture the wound for any occult infection given the new area of depth and erythema. We will hold off on sensitivities for initiation of antibiotic therapy. 02/22/18-She is here in follow up evaluation for left great toe ulcer. There is overall significant improvement in both wound appearance, erythema and edema with changes made last week. She was not initiated on antibiotic therapy. Culture obtained last week showed oxacillin sensitive staph aureus, sensitive to clindamycin. Clindamycin has been called into the pharmacy but she has been instructed to hold off on initiation secondary to overall clinical improvement and her history of antibiotic intolerance. She has been instructed to contact the clinic with any noted changes/deterioration and the wound, erythema, Pardon, Cynithia A. ([id number]) edema and/or pain. She will follow-up next week. She continues to smoke and her glucose levels remain elevated >250; she admits to compliance with offloading shoe 03/01/18 on evaluation today patient appears to be doing fairly well in regard to her left first toe ulcer. She has been tolerating the dressing changes with the [facility] Dressing without complication and overall this has definitely showed signs of improvement according to records as well is what the patient tells me today. I'm very pleased in that regard. She is having no pain today 03/08/18 She is here for follow up evaluation of a left great toe  ulcer. She remains non-compliant with glucose control and smoking cessation; glucose levels consistently >200. She states that she got new shoe inserts/peg assist. She admits to compliance with offloading. Since my last evaluation there is significant improvement. We  will switch to prisma at this time and she will follow up next week. She is noted to be tachycardic at this appointment, heart rate 120s; she has a history of heart rate 70-130 according to our records. She admits to extreme agitation r/t personal issues; she was advised to monitor her heartrate and contact her physician if it does not return to a more normal range (<100). She takes cardizem twice daily. 03/15/18-She is here in follow-up evaluation for left great toe ulcer. She remains noncompliant with glucose control and smoking cessation. She admits to compliance with wearing offloading shoe. The ulcer is improved/stable and we will continue with the same treatment plan and she will follow-up next week 03/22/18-She is here for evaluation for left great toe ulcer. There continues to be significant improvement despite recurrent hyperglycemia (over 500 yesterday) and she continues to smoke. She has been compliant with offloading and we will continue with same treatment plan and she will follow-up next week. 03/29/18-She is here for evaluation for left great toe ulcer. Despite continuing to smoke and uncontrolled diabetes she continues to improve. She is compliant with offloading shoe. We will continue with the same treatment plan and she will follow-up next week 04/05/18- She is here in follow up evaluation for a left great toe ulcer; she presents with small pustule to left fifth toe (resembles ant bite). She admits to compliance with wearing offloading shoe; continues to smoke or have uncontrolled blood glucose control. There is more callus than usual with evidence of bleeding; she denies known trauma. 04/12/18-She is here for evaluation of left great toe ulcer. Despite noncompliance with glycemic control and smoking she continues to make improvement. She continues to wear offloading shoe. The pustule, that was identified last week, to the left fifth toe is resolved. She will follow-up in 2  weeks 05/03/18-she is seen in follow-up evaluation for a left great toe ulcer. She is compliant with offloading, otherwise noncompliant with glycemic control and smoking. She has plateaued and there is minimal improvement noted. We will transition to [facility], replaced the insert to her surgical shoe and she will follow-up in one week 05/10/18- She is here in follow up evaluation for a left great toe ulcer. It appears stable despite measurement change. We will continue with same treatment plan and follow up next week. 05/24/18-She is seen in follow-up evaluation for a left great toe ulcer. She remains compliant with offloading, has made significant improvement in her diet, decreasing the amount of sugar/soda. She said her recent A1c was 10.9 which is lower than. She did see a diabetic nutritionist/educator yesterday. She continues to smoke. We will continue with the same treatment plan and she'll follow-up next week. 05/31/18- She is seen in follow-up evaluation for left great toe ulcer. She continues to remain compliant with offloading, continues to make improvement in her diet, increasing her water and decreasing the amount of sugar/soda. She does continue to smoke with no desire to quit. We will apply Prisma to the depth and Hydrofera Blue over. We have not received insurance authorization for oasis. She will follow up next week. 06/07/18-She is seen in follow-up evaluation for left great toe ulcer. It has stalled according to today's measurements although base appears stable. She says she saw a diabetic  educator yesterday; her average blood sugars are less than 300 which is an improvement for her. She continues to smoke and states "that's my next step" She continues with water over soda. We will order for xray, culture and reinstate ace wrap compression prior to placing apligraf for next week. She is voicing no complaints or concerns. Her dressing will change to iodoflex over the next week in  preparation for apligraf. 06/14/18-She is seen in follow-up evaluation for left great toe ulcer. Plain film x-ray performed last week was negative for osteomyelitis. Wound culture obtained last week grew strep B and OSSA; she is initiated on keflex and cefdinir today; there is erythema to the toe which could be from ace wrap compression, she has a history of wrapping too tight and has has been encouraged to maintain ace wraps that we place today. We will hold off on application of apligraf today, will apply next week after antibiotic therapy has been initiated. She admits today that she has resumed taking a shower with her foot/toe submerged in water, she has been reminded to keep foot/toe out of the bath water. She will be seen in follow up next week 06/21/18-she is seen in follow-up evaluation for left great toe ulcer. She is tolerating antibiotic therapy with no GI disturbance. The wound is stable. Apligraf was applied today. She has been decreasing her smoking, only had 4 cigarettes yesterday and 1 today. She continues being more compliant in diabetic diet. She will follow-up next week for evaluation of site, if stable will remove at 2 weeks. 06/28/18- She is here in follow up evalution. Apligraf was placed last week, she states the dressing fell off on Tuesday and she was dressing with hydrofera blue. She is healed and will be discharged from the clinic today. She has been instructed to continue with smoking cessation, continue monitoring glucose levels, offloading for an additional 4 weeks and continue with Boback, Ronnika A. ([id number]) hydrofera blue for additional two weeks for any possible microscopic opening. Readmission: 08/07/18 on evaluation today patient presents for reevaluation concerning the ulcer of her right great toe. She was previously discharged on 06/28/18 healed. Nonetheless she states that this began to show signs of drainage she subsequently went to her primary care provider.  Subsequently an x-ray was performed on 08/01/18 which was negative. The patient was also placed on antibiotics at that time. Fortunately they should have been effective for the infection. Nonetheless she's been experiencing some improvement but still has a lot of drainage coming from the wound itself. 08/14/18 on evaluation today patient's wound actually does show signs of improvement in regard to the erythema at this point. She has completed the antibiotics. With that being said we did discuss the possibility of placing her in a total contact cast as of today although I think that I may want to give this just a little bit more time to ensure nothing recurrence as far as her infection is concerned. I do not want to put in the cast and risk infection at that time if things are not completely resolved. With that being said she is gonna require some debridement today. 08/21/18 on evaluation today patient actually appears to be doing okay in regard to her toe ulcer. She's been tolerating the dressing changes without complication. With that being said it does appear that she is ready and in fact I think it's appropriate for [place] to go ahead and initiate the total contact cast today. Nonetheless she will require some sharp debridement  to prepare the wound for application. Overall I feel like things have been progressing well but we do need to do something to get this to close more readily. 08/24/18 patient seen today for reevaluation after having had the total contact cast applied on Tuesday. She seems to have done very well the wound appears to be doing great and overall I'm pleased with the progress that she's made. There were no abnormal areas of rubbing from the cast on her lower extremity. 08/30/18 on evaluation today patient actually appears to be completely healed in regard to her plantar toe ulcer. She tells me at this point she's been having a lot of issues with the cast. She almost fell a couple of  times the state shall the step of her dog a couple times as well. This is been a very frustrating process for her other nonetheless she has completely healed the wound which is excellent news. Overall there does not appear to be the evidence of infection at this time which is great news. Electronic Signature(s) Signed: 08/30/2018 5:47:48 PM By: Worthy Keeler PA-C Entered By: Worthy Keeler on 08/30/2018 13:07:12 [name] ([id number]) -------------------------------------------------------------------------------- Physical Exam Details Patient Name: Heather Dear A. Date of Service: 08/30/2018 12:30 PM Medical Record Number: [id number] Patient Account Number: [id number] Date of Birth/Sex: 1976-10-08 (42 y.o. F) Treating RN: Cornell Barman Primary Care Provider: Priscille Kluver Other Clinician: Referring Provider: Priscille Kluver Treating Provider/Extender: STONE III, Zayvian Mcmurtry Weeks in Treatment: 3 Constitutional Well-nourished and well-hydrated in no acute distress. Respiratory normal breathing without difficulty. clear to auscultation bilaterally. Cardiovascular regular rate and rhythm with normal S1, S2. Psychiatric this patient is able to make decisions and demonstrates good insight into disease process. Alert and Oriented x 3. pleasant and cooperative. Notes Patient's wound bed currently show signs of complete epithelialization which is good news. Overall there does not appear to be the evidence of infection which is good news as well. In general I'm very happy with the overall process. I do believe she's gonna need some kind of offloading shoe. In fact she may actually need a brace to help with the foot drop noted on the left. Electronic Signature(s) Signed: 08/30/2018 5:47:48 PM By: Worthy Keeler PA-C Entered By: Worthy Keeler on 08/30/2018 13:14:00 [name] ([id number]) -------------------------------------------------------------------------------- Physician Orders  Details Patient Name: Heather Dear A. Date of Service: 08/30/2018 12:30 PM Medical Record Number: [id number] Patient Account Number: [id number] Date of Birth/Sex: [birth year] (42 y.o. F) Treating RN: Cornell Barman Primary Care Provider: Priscille Kluver Other Clinician: Referring Provider: Priscille Kluver Treating Provider/Extender: Melburn Hake, Alandra Sando Weeks in Treatment: 3 Verbal / Phone Orders: No Diagnosis Coding ICD-10 Coding Code Description E11.621 Type 2 diabetes mellitus with foot ulcer L03.032 Cellulitis of left toe L97.522 Non-pressure chronic ulcer of other part of left foot with fat layer exposed Off-Loading o Other: - Talk to PCP regarding Diabetic Shoes Discharge From [facility] o Discharge from [facility] Complete Electronic Signature(s) Signed: 08/30/2018 5:47:28 PM By: Gretta Cool, BSN, RN, CWS, [name] RN, BSN Signed: 08/30/2018 5:47:48 PM By: Worthy Keeler PA-C Entered By: Gretta Cool, BSN, RN, CWS, [name] on 08/30/2018 13:00:41 [name] ([id number]) -------------------------------------------------------------------------------- Problem List Details Patient Name: Heather Dear A. Date of Service: 08/30/2018 12:30 PM Medical Record Number: [id number] Patient Account Number: [id number] Date of Birth/Sex: 12/23/1975 (42 y.o. F) Treating RN: Cornell Barman Primary Care Provider: Priscille Kluver Other Clinician: Referring Provider: Priscille Kluver Treating Provider/Extender: STONE III, Regena Delucchi Weeks in  Treatment: 3 Active Problems ICD-10 Evaluated Encounter Code Description Active Date Today Diagnosis E11.621 Type 2 diabetes mellitus with foot ulcer 08/07/2018 No Yes L03.032 Cellulitis of left toe 08/07/2018 No Yes L97.522 Non-pressure chronic ulcer of other part of left foot with fat 08/08/2018 No Yes layer exposed Inactive Problems Resolved Problems Electronic Signature(s) Signed: 08/30/2018 5:47:48 PM By: Worthy Keeler PA-C Entered By: Worthy Keeler on 08/30/2018  12:55:19 [name], [name] ([id number]) -------------------------------------------------------------------------------- Progress Note Details Patient Name: Heather Dear A. Date of Service: 08/30/2018 12:30 PM Medical Record Number: [id number] Patient Account Number: [id number] Date of Birth/Sex: 05-May-1976 (42 y.o. F) Treating RN: Cornell Barman Primary Care Provider: Priscille Kluver Other Clinician: Referring Provider: Priscille Kluver Treating Provider/Extender: Melburn Hake, [place] Kosch Weeks in Treatment: 3 Subjective Chief Complaint Information obtained from Patient She is here for evaluation of a left great toe ulcer History of Present Illness (HPI) 01/18/18-She is here for initial evaluation of the left great toe ulcer. She is a poor historian in regards to timeframe in detail. She states approximately 4 weeks ago she lacerated her toe on something in the house. She followed up with her primary care who placed her on Bactrim and ultimately a second dose of Bactrim prior to coming to wound clinic. She states she has been treating the toe with peroxide, Betadine and a Band-Aid. She did not check her blood sugar this morning but checked it yesterday morning it was 327; she is unaware of a recent A1c and there are no current records. She saw Dr. she would've orthopedics last week for an old injury to the left ankle, she states he did not see her toe, nor did she bring it to his attention. She smokes approximately 1 pack cigarettes a day. Her social situation is concerning, she arrives this morning with her mother who appears extremely intoxicated/under the influence; her mother was asked to leave the room and be monitored by the patient's grandmother. The patient's aunt then accompanied the patient and the room throughout the rest of the appointment. We had a lengthy discussion regarding the deleterious effects of uncontrolled hyperglycemia and smoking as it relates to wound healing and overall health. She  was strongly encouraged to decrease her smoking and get her diabetes under better control. She states she is currently on a diet and has cut down her [facility] consumption. The left toe is erythematous, macerated and slightly edematous with malodor present. The edema in her left foot is below her baseline, there is no erythema streaking. We will treat her with Santyl, doxycycline; we have ordered and xray, culture and provided a Peg assist surgical shoe and cultured the wound. 01/25/18-She is here in follow-up evaluation for a left great toe ulcer and presents with an abscess to her suprapubic area. She states her blood sugars remain elevated, feeling "sick" and if levels are below 250, but she is trying. She has made no attempt to decrease her smoking stating that we "can't take away her food in her cigarettes". She has been compliant with offloading using the PEG assist you. She is using Santyl daily. the culture obtained last week grew staph aureus and Enterococcus faecalis; continues on the doxycycline and Augmentin was added on Monday. The suprapubic area has erythema, no femoral variation, purple discoloration, minimal induration, was accessed with a cotton tip applicator with sanguinopurulent drainage, this was cultured, I suspect the current antibiotic treatment will cover and we will not add anything to her current treatment plan. She was advised  to go to urgent care or ER with any change in redness, induration or fever. 02/01/18-She is here in follow-up evaluation for left great toe ulcers and a new abdominal abscess from last week. She was able to use packing until earlier this week, where she "forgot it was there". She states she was feeling ill with GI symptoms last week and was not taking her antibiotic. She states her glucose levels have been predominantly less than 200, with occasional levels between 200-250. She thinks this was contributing to her GI symptoms as they have resolved  without intervention. There continues to be significant laceration to left toe, otherwise it clinically looks stable/improved. There is now less superficial opening to the lateral aspect of the great toe that was residual blister. We will transition to [facility] to all wounds, she will continue her Augmentin. If there is no change or deterioration next week for reculture. 02/08/18-She is here in follow-up evaluation for left great toe ulcer and abdominal ulcer. There is an improvement in both wounds. She has been wrapping her left toe with coban, not by our direction, which has created an area of discoloration to the medial aspect; she has been advised to NOT use coban secondary to her neuropathy. She states her glucose levels have been high over this last week ranging from 200-350, she continues to smoke. She admits to being less compliant with her offloading shoe. We will continue with same treatment plan and she will follow-up next week. 02/15/18-She is here in follow-up evaluation for left great toe ulcer and abdominal ulcer. The abdominal ulcer is epithelialized. The left great toe ulcer is improved and all injury from last week using the Coban wrap is resolved, the lateral ulcer is healed. She admits to noncompliance with wearing offloading shoe and admits to glucose levels being greater than 300 most of the week. She continues to smoke and expresses no desire to quit. There is one area medially that probes deeper than it has historically, erythema to the toe and dorsal foot has consistently waxed and waned. There is no overt signs of cellulitis or infection but we will culture the wound for any occult infection given the new area of depth and erythema. We will hold off on Palazzo, [name] ([id number]) sensitivities for initiation of antibiotic therapy. 02/22/18-She is here in follow up evaluation for left great toe ulcer. There is overall significant improvement in both wound appearance,  erythema and edema with changes made last week. She was not initiated on antibiotic therapy. Culture obtained last week showed oxacillin sensitive staph aureus, sensitive to clindamycin. Clindamycin has been called into the pharmacy but she has been instructed to hold off on initiation secondary to overall clinical improvement and her history of antibiotic intolerance. She has been instructed to contact the clinic with any noted changes/deterioration and the wound, erythema, edema and/or pain. She will follow-up next week. She continues to smoke and her glucose levels remain elevated >250; she admits to compliance with offloading shoe 03/01/18 on evaluation today patient appears to be doing fairly well in regard to her left first toe ulcer. She has been tolerating the dressing changes with the [facility] Dressing without complication and overall this has definitely showed signs of improvement according to records as well is what the patient tells me today. I'm very pleased in that regard. She is having no pain today 03/08/18 She is here for follow up evaluation of a left great toe ulcer. She remains non-compliant with glucose control  and smoking cessation; glucose levels consistently >200. She states that she got new shoe inserts/peg assist. She admits to compliance with offloading. Since my last evaluation there is significant improvement. We will switch to prisma at this time and she will follow up next week. She is noted to be tachycardic at this appointment, heart rate 120s; she has a history of heart rate 70-130 according to our records. She admits to extreme agitation r/t personal issues; she was advised to monitor her heartrate and contact her physician if it does not return to a more normal range (<100). She takes cardizem twice daily. 03/15/18-She is here in follow-up evaluation for left great toe ulcer. She remains noncompliant with glucose control and smoking cessation. She admits to  compliance with wearing offloading shoe. The ulcer is improved/stable and we will continue with the same treatment plan and she will follow-up next week 03/22/18-She is here for evaluation for left great toe ulcer. There continues to be significant improvement despite recurrent hyperglycemia (over 500 yesterday) and she continues to smoke. She has been compliant with offloading and we will continue with same treatment plan and she will follow-up next week. 03/29/18-She is here for evaluation for left great toe ulcer. Despite continuing to smoke and uncontrolled diabetes she continues to improve. She is compliant with offloading shoe. We will continue with the same treatment plan and she will follow-up next week 04/05/18- She is here in follow up evaluation for a left great toe ulcer; she presents with small pustule to left fifth toe (resembles ant bite). She admits to compliance with wearing offloading shoe; continues to smoke or have uncontrolled blood glucose control. There is more callus than usual with evidence of bleeding; she denies known trauma. 04/12/18-She is here for evaluation of left great toe ulcer. Despite noncompliance with glycemic control and smoking she continues to make improvement. She continues to wear offloading shoe. The pustule, that was identified last week, to the left fifth toe is resolved. She will follow-up in 2 weeks 05/03/18-she is seen in follow-up evaluation for a left great toe ulcer. She is compliant with offloading, otherwise noncompliant with glycemic control and smoking. She has plateaued and there is minimal improvement noted. We will transition to [facility], replaced the insert to her surgical shoe and she will follow-up in one week 05/10/18- She is here in follow up evaluation for a left great toe ulcer. It appears stable despite measurement change. We will continue with same treatment plan and follow up next week. 05/24/18-She is seen in follow-up evaluation  for a left great toe ulcer. She remains compliant with offloading, has made significant improvement in her diet, decreasing the amount of sugar/soda. She said her recent A1c was 10.9 which is lower than. She did see a diabetic nutritionist/educator yesterday. She continues to smoke. We will continue with the same treatment plan and she'll follow-up next week. 05/31/18- She is seen in follow-up evaluation for left great toe ulcer. She continues to remain compliant with offloading, continues to make improvement in her diet, increasing her water and decreasing the amount of sugar/soda. She does continue to smoke with no desire to quit. We will apply Prisma to the depth and Hydrofera Blue over. We have not received insurance authorization for oasis. She will follow up next week. 06/07/18-She is seen in follow-up evaluation for left great toe ulcer. It has stalled according to today's measurements although base appears stable. She says she saw a diabetic educator yesterday; her average blood sugars are  less than 300 which is an improvement for her. She continues to smoke and states "that's my next step" She continues with water over soda. We will order for xray, culture and reinstate ace wrap compression prior to placing apligraf for next week. She is voicing no complaints or concerns. Her dressing will change to iodoflex over the next week in preparation for apligraf. 06/14/18-She is seen in follow-up evaluation for left great toe ulcer. Plain film x-ray performed last week was negative for osteomyelitis. Wound culture obtained last week grew strep B and OSSA; she is initiated on keflex and cefdinir today; there is erythema to the toe which could be from ace wrap compression, she has a history of wrapping too tight and has has been encouraged to maintain ace wraps that we place today. We will hold off on application of apligraf today, will apply next week after antibiotic therapy has been initiated. She  admits today that she has resumed taking a shower with her foot/toe submerged in water, she has been reminded to keep foot/toe out of the bath water. She will be seen in follow up next week 06/21/18-she is seen in follow-up evaluation for left great toe ulcer. She is tolerating antibiotic therapy with no GI disturbance. The wound is stable. Apligraf was applied today. She has been decreasing her smoking, only had 4 cigarettes yesterday and [name], [name] ([id number]) 1 today. She continues being more compliant in diabetic diet. She will follow-up next week for evaluation of site, if stable will remove at 2 weeks. 06/28/18- She is here in follow up evalution. Apligraf was placed last week, she states the dressing fell off on Tuesday and she was dressing with hydrofera blue. She is healed and will be discharged from the clinic today. She has been instructed to continue with smoking cessation, continue monitoring glucose levels, offloading for an additional 4 weeks and continue with hydrofera blue for additional two weeks for any possible microscopic opening. Readmission: 08/07/18 on evaluation today patient presents for reevaluation concerning the ulcer of her right great toe. She was previously discharged on 06/28/18 healed. Nonetheless she states that this began to show signs of drainage she subsequently went to her primary care provider. Subsequently an x-ray was performed on 08/01/18 which was negative. The patient was also placed on antibiotics at that time. Fortunately they should have been effective for the infection. Nonetheless she's been experiencing some improvement but still has a lot of drainage coming from the wound itself. 08/14/18 on evaluation today patient's wound actually does show signs of improvement in regard to the erythema at this point. She has completed the antibiotics. With that being said we did discuss the possibility of placing her in a total contact cast as of today  although I think that I may want to give this just a little bit more time to ensure nothing recurrence as far as her infection is concerned. I do not want to put in the cast and risk infection at that time if things are not completely resolved. With that being said she is gonna require some debridement today. 08/21/18 on evaluation today patient actually appears to be doing okay in regard to her toe ulcer. She's been tolerating the dressing changes without complication. With that being said it does appear that she is ready and in fact I think it's appropriate for [place] to go ahead and initiate the total contact cast today. Nonetheless she will require some sharp debridement to prepare the wound for application. Overall  I feel like things have been progressing well but we do need to do something to get this to close more readily. 08/24/18 patient seen today for reevaluation after having had the total contact cast applied on Tuesday. She seems to have done very well the wound appears to be doing great and overall I'm pleased with the progress that she's made. There were no abnormal areas of rubbing from the cast on her lower extremity. 08/30/18 on evaluation today patient actually appears to be completely healed in regard to her plantar toe ulcer. She tells me at this point she's been having a lot of issues with the cast. She almost fell a couple of times the state shall the step of her dog a couple times as well. This is been a very frustrating process for her other nonetheless she has completely healed the wound which is excellent news. Overall there does not appear to be the evidence of infection at this time which is great news. Patient History Social History Current every day smoker. Medical History Eyes Patient has history of Cataracts, Glaucoma, Optic Neuritis Ear/Nose/Mouth/Throat Patient has history of Chronic sinus problems/congestion, Middle ear problems Review of Systems  (ROS) Constitutional Symptoms ([place]) Denies complaints or symptoms of Fever, Chills. Psychiatric The patient has no complaints or symptoms. [name], [name] ([id number]) Objective Constitutional Well-nourished and well-hydrated in no acute distress. Vitals Time Taken: 12:40 PM, Height: 69 in, Weight: 240 lbs, BMI: 35.4, Temperature: 98.5 F, Pulse: 92 bpm, Respiratory Rate: 16 breaths/min, Blood Pressure: 110/68 mmHg. Respiratory normal breathing without difficulty. clear to auscultation bilaterally. Cardiovascular regular rate and rhythm with normal S1, S2. Psychiatric this patient is able to make decisions and demonstrates good insight into disease process. Alert and Oriented x 3. pleasant and cooperative. General Notes: Patient's wound bed currently show signs of complete epithelialization which is good news. Overall there does not appear to be the evidence of infection which is good news as well. In general I'm very happy with the overall process. I do believe she's gonna need some kind of offloading shoe. In fact she may actually need a brace to help with the foot drop noted on the left. Integumentary (Hair, Skin) Wound #4 status is Healed - Epithelialized. Original cause of wound was Gradually Appeared. The wound is located on the Left Toe Great. The wound measures 0cm length x 0cm width x 0cm depth; 0cm^2 area and 0cm^3 volume. There is no tunneling or undermining noted. There is no granulation within the wound bed. There is no necrotic tissue within the wound bed. The periwound skin appearance exhibited: Callus. The periwound skin appearance did not exhibit: Crepitus, Excoriation, Induration, Rash, Scarring, Dry/Scaly, Maceration, Atrophie Blanche, Cyanosis, Ecchymosis, Hemosiderin Staining, Mottled, Pallor, Rubor, Erythema. Assessment Active Problems ICD-10 Type 2 diabetes mellitus with foot ulcer Cellulitis of left toe Non-pressure chronic ulcer of other part of  left foot with fat layer exposed Plan Off-Loading: Other: - Talk to PCP regarding Diabetic Shoes [name], [name] ([id number]) [facility]charge From [facility] Services: Discharge from [facility] - Treatment Complete My biggest concern at this point in time is going to be continued offloading and specifically the management of the left foot drop. I think this is one of the driving factors for why she continues to have a reopening in regard to this wound. She's going to be seeing her primary care provider on Friday two weeks from now. In meantime I recommended she use offloading techniques including acorn/callous pad as well is the  postop shoe that we provided for her today. She's in agreement that plan. Anything she can do to keep pressure off of the site is going to be beneficial until she can get the appropriate shoes. If anything changes in the meantime shall contact the office and let me know. Electronic Signature(s) Signed: 08/30/2018 5:47:48 PM By: Worthy Keeler PA-C Entered By: Worthy Keeler on 08/30/2018 13:15:06 [name] ([id number]) -------------------------------------------------------------------------------- ROS/PFSH Details Patient Name: Heather Dear A. Date of Service: 08/30/2018 12:30 PM Medical Record Number: [id number] Patient Account Number: [id number] Date of Birth/Sex: [birth year] (42 y.o. F) Treating RN: Cornell Barman Primary Care Provider: Priscille Kluver Other Clinician: Referring Provider: Priscille Kluver Treating Provider/Extender: STONE III, Labresha Mellor Weeks in Treatment: 3 Wound History Constitutional Symptoms (General Health) Complaints and Symptoms: Negative for: Fever; Chills Eyes Medical History: Positive for: Cataracts; Glaucoma; Optic Neuritis Ear/Nose/Mouth/Throat Medical History: Positive for: Chronic sinus problems/congestion; Middle ear problems Psychiatric Complaints and Symptoms: No Complaints or Symptoms HBO Extended History  Items Ear/Nose/Mouth/Throat: Eyes: Eyes: Ear/Nose/Mouth/Throat: Chronic sinus Cataracts Glaucoma Middle ear problems problems/congestion Implantable Devices Family and Social History Current every day smoker Physician Affirmation I have reviewed and agree with the above information. Electronic Signature(s) Signed: 08/30/2018 5:47:28 PM By: Gretta Cool, BSN, RN, CWS, [name] RN, BSN Signed: 08/30/2018 5:47:48 PM By: Worthy Keeler PA-C Entered By: Worthy Keeler on 08/30/2018 13:09:42 [name] ([id number]) -------------------------------------------------------------------------------- SuperBill Details Patient Name: Heather Dear A. Date of Service: 08/30/2018 Medical Record Number: [id number] Patient Account Number: [id number] Date of Birth/Sex: [birth year] (42 y.o. F) Treating RN: Cornell Barman Primary Care Provider: Priscille Kluver Other Clinician: Referring Provider: Priscille Kluver Treating Provider/Extender: Melburn Hake, Mishon Blubaugh Weeks in Treatment: 3 Diagnosis Coding ICD-10 Codes Code Description E11.621 Type 2 diabetes mellitus with foot ulcer L03.032 Cellulitis of left toe L97.522 Non-pressure chronic ulcer of other part of left foot with fat layer exposed Facility Procedures CPT4 Code: [id number] Description: [phone] - WOUND CARE VISIT-LEV 2 EST PT Modifier: Quantity: 1 Physician Procedures CPT4 Code: [id number] Description: 62694 - WC PHYS LEVEL 3 - EST PT ICD-10 Diagnosis Description E11.621 Type 2 diabetes mellitus with foot ulcer L03.032 Cellulitis of left toe L97.522 Non-pressure chronic ulcer of other part of left foot with fa Modifier: t layer exposed Quantity: 1 Electronic Signature(s) Signed: 08/30/2018 5:47:48 PM By: Worthy Keeler PA-C Entered By: Worthy Keeler on 08/30/2018 13:15:18

## 2018-09-01 NOTE — Progress Notes (Signed)
BRYCELYN, GAMBINO ([id number]) Visit Report for 08/30/2018 Arrival Information Details Patient Name: [name], [name] Date of Service: 08/30/2018 12:30 PM Medical Record Number: [id number] Patient Account Number: [id number] Date of Birth/Sex: [birth year] (42 y.o. F) Treating RN: Cornell Barman Primary Care Jahaziel Francois: Priscille Kluver Other Clinician: Referring Jamil Armwood: Priscille Kluver Treating Mahira Petras/Extender: Melburn Hake, HOYT Weeks in Treatment: 3 Visit Information History Since Last Visit Added or deleted any medications: No Patient Arrived: Wheel Chair Any new allergies or adverse reactions: No Arrival Time: 12:39 Had a fall or experienced change in No Accompanied By: cousin activities of daily living that may affect Transfer Assistance: None risk of falls: Patient Identification Verified: Yes Signs or symptoms of abuse/neglect since last visito No Secondary Verification Process Completed: Yes Hospitalized since last visit: No Implantable device outside of the clinic excluding No cellular tissue based products placed in the center since last visit: Has Dressing in Place as Prescribed: Yes Has Footwear/Offloading in Place as Prescribed: Yes Left: Total Contact Cast Pain Present Now: No Electronic Signature(s) Signed: 08/30/2018 5:47:28 PM By: Gretta Cool, BSN, RN, CWS, [name] RN, BSN Entered By: Gretta Cool, BSN, RN, CWS, [name] on 08/30/2018 12:39:53 [name] ([id number]) -------------------------------------------------------------------------------- Clinic Level of Care Assessment Details Patient Name: Heather Dear A. Date of Service: 08/30/2018 12:30 PM Medical Record Number: [id number] Patient Account Number: [id number] Date of Birth/Sex: May 31, 1976 (42 y.o. F) Treating RN: Cornell Barman Primary Care Maliik Karner: Priscille Kluver Other Clinician: Referring Rithika Seel: Priscille Kluver Treating Lesean Woolverton/Extender: Melburn Hake, HOYT Weeks in Treatment: 3 Clinic Level of Care Assessment Items TOOL 4 Quantity  Score []  - Use when only an EandM is performed on FOLLOW-UP visit 0 ASSESSMENTS - Nursing Assessment / Reassessment []  - Reassessment of Co-morbidities (includes updates in patient status) 0 X- 1 5 Reassessment of Adherence to Treatment Plan ASSESSMENTS - Wound and Skin Assessment / Reassessment X - Simple Wound Assessment / Reassessment - one wound 1 5 []  - 0 Complex Wound Assessment / Reassessment - multiple wounds []  - 0 Dermatologic / Skin Assessment (not related to wound area) ASSESSMENTS - Focused Assessment []  - Circumferential Edema Measurements - multi extremities 0 []  - 0 Nutritional Assessment / Counseling / Intervention []  - 0 Lower Extremity Assessment (monofilament, tuning fork, pulses) []  - 0 Peripheral Arterial Disease Assessment (using hand held doppler) ASSESSMENTS - Ostomy and/or Continence Assessment and Care []  - Incontinence Assessment and Management 0 []  - 0 Ostomy Care Assessment and Management (repouching, etc.) PROCESS - Coordination of Care X - Simple Patient / Family Education for ongoing care 1 15 []  - 0 Complex (extensive) Patient / Family Education for ongoing care []  - 0 Staff obtains Programmer, systems, Records, Test Results / Process Orders []  - 0 Staff telephones HHA, Nursing Homes / Clarify orders / etc []  - 0 Routine Transfer to another Facility (non-emergent condition) []  - 0 Routine Hospital Admission (non-emergent condition) []  - 0 New Admissions / Biomedical engineer / Ordering NPWT, Apligraf, etc. []  - 0 Emergency Hospital Admission (emergent condition) X- 1 10 Simple Discharge Coordination [name], [name] ([id number]) []  - 0 Complex (extensive) Discharge Coordination PROCESS - Special Needs []  - Pediatric / Minor Patient Management 0 []  - 0 Isolation Patient Management []  - 0 Hearing / Language / Visual special needs []  - 0 Assessment of Community assistance (transportation, D/C planning, etc.) []  - 0 Additional assistance /  Altered mentation []  - 0 Support Surface(s) Assessment (bed, cushion, seat, etc.) INTERVENTIONS - Wound Cleansing / Measurement X - Simple Wound Cleansing -  one wound 1 5 []  - 0 Complex Wound Cleansing - multiple wounds X- 1 5 Wound Imaging (photographs - any number of wounds) []  - 0 Wound Tracing (instead of photographs) X- 1 5 Simple Wound Measurement - one wound []  - 0 Complex Wound Measurement - multiple wounds INTERVENTIONS - Wound Dressings []  - Small Wound Dressing one or multiple wounds 0 []  - 0 Medium Wound Dressing one or multiple wounds []  - 0 Large Wound Dressing one or multiple wounds []  - 0 Application of Medications - topical []  - 0 Application of Medications - injection INTERVENTIONS - Miscellaneous []  - External ear exam 0 []  - 0 Specimen Collection (cultures, biopsies, blood, body fluids, etc.) []  - 0 Specimen(s) / Culture(s) sent or taken to Lab for analysis []  - 0 Patient Transfer (multiple staff / Civil Service fast streamer / Similar devices) []  - 0 Simple Staple / Suture removal (25 or less) []  - 0 Complex Staple / Suture removal (26 or more) []  - 0 Hypo / Hyperglycemic Management (close monitor of Blood Glucose) []  - 0 Ankle / Brachial Index (ABI) - do not check if billed separately X- 1 5 Vital Signs [name], [name] ([id number]) Has the patient been seen at the hospital within the last three years: Yes Total Score: 55 Level Of Care: New/Established - Level 2 Electronic Signature(s) Signed: 08/30/2018 5:47:28 PM By: Gretta Cool, BSN, RN, CWS, [name] RN, BSN Entered By: Gretta Cool, BSN, RN, CWS, [name] on 08/30/2018 13:01:07 [name] ([id number]) -------------------------------------------------------------------------------- Encounter Discharge Information Details Patient Name: Heather Dear A. Date of Service: 08/30/2018 12:30 PM Medical Record Number: [id number] Patient Account Number: [id number] Date of Birth/Sex: [birth year] (42 y.o. F) Treating RN: Cornell Barman Primary Care Deigo Alonso: Priscille Kluver Other Clinician: Referring Arnella Pralle: Priscille Kluver Treating Iara Monds/Extender: Melburn Hake, HOYT Weeks in Treatment: 3 Encounter Discharge Information Items Discharge Condition: Stable Ambulatory Status: Wheelchair Discharge Destination: Home Transportation: Private Auto Accompanied By: cousin Schedule Follow-up Appointment: Yes Clinical Summary of Care: Electronic Signature(s) Signed: 08/30/2018 5:47:28 PM By: Gretta Cool, BSN, RN, CWS, [name] RN, BSN Entered By: Gretta Cool, BSN, RN, CWS, [name] on 08/30/2018 13:07:23 [name] ([id number]) -------------------------------------------------------------------------------- Lower Extremity Assessment Details Patient Name: Heather Dear A. Date of Service: 08/30/2018 12:30 PM Medical Record Number: [id number] Patient Account Number: [id number] Date of Birth/Sex: April 21, 1976 (42 y.o. F) Treating RN: Cornell Barman Primary Care Lavonne Cass: Priscille Kluver Other Clinician: Referring Virdell Hoiland: Priscille Kluver Treating Brailen Macneal/Extender: Melburn Hake, HOYT Weeks in Treatment: 3 Vascular Assessment Pulses: Dorsalis Pedis Palpable: [Left:Yes] Posterior Tibial Extremity colors, hair growth, and conditions: Extremity Color: [Left:Normal] Hair Growth on Extremity: [Left:Yes] Temperature of Extremity: [Left:Warm] Capillary Refill: [Left:< 3 seconds] Toe Nail Assessment Left: Right: Thick: No Discolored: No Deformed: No Improper Length and Hygiene: No Electronic Signature(s) Signed: 08/30/2018 5:47:28 PM By: Gretta Cool, BSN, RN, CWS, [name] RN, BSN Entered By: Gretta Cool, BSN, RN, CWS, [name] on 08/30/2018 12:53:43 [name] ([id number]) -------------------------------------------------------------------------------- Multi Wound Chart Details Patient Name: Heather Dear A. Date of Service: 08/30/2018 12:30 PM Medical Record Number: [id number] Patient Account Number: [id number] Date of Birth/Sex: 1976-03-31 (42 y.o. 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] [N/A:N/A] Date  Acquired: [4:07/24/2018] [N/A:N/A] Weeks of Treatment: [4:3] [N/A:N/A] Wound Status: [4:Open] [N/A:N/A] Pending Amputation on [4:Yes] [N/A:N/A] Presentation: Measurements L x W x D [4:0.1x0.1x0.1] [N/A:N/A] (cm) Area (cm) : [8:3.419] [N/A:N/A] Volume (cm) : [4:0.001] [N/A:N/A] % Reduction in Area: [4:99.50%] [N/A:N/A] % Reduction in Volume: [4:99.40%] [N/A:N/A] Classification: [4:Grade 1] [N/A:N/A] Periwound Skin Texture: [4:No Abnormalities Noted] [N/A:N/A] Periwound Skin Moisture: [4:No Abnormalities Noted] [N/A:N/A] Periwound Skin Color: [4:No Abnormalities Noted No] [N/A:N/A N/A] Treatment Notes Electronic Signature(s) Signed: 08/30/2018 5:47:28 PM By: Gretta Cool, BSN, RN, CWS, [name] RN, BSN Entered By: Gretta Cool, BSN, RN, CWS, [name] on 08/30/2018 12:53:55 [name] -------------------------------------------------------------------------------- [place] Details Patient Name: Heather Dear A. Date of Service: 08/30/2018 12:30 PM Medical Record Number: [id number] Patient Account Number: [id number] Date of Birth/Sex: 1976/07/06 (42 y.o. F) Treating RN: Cornell Barman Primary Care Collin Hendley: Priscille Kluver Other Clinician: Referring Glyn Gerads: Priscille Kluver Treating Chrsitopher Wik/Extender: Sharalyn Ink in Treatment: 3 Active Inactive Electronic Signature(s) Signed: 08/30/2018 5:47:28 PM By: Gretta Cool, BSN, RN, CWS,  [name] RN, BSN Entered By: Gretta Cool, BSN, RN, CWS, [name] on 08/30/2018 13:07:39 [name] ([id number]) -------------------------------------------------------------------------------- Pain Assessment Details Patient Name: Heather Dear A. Date of Service: 08/30/2018 12:30 PM Medical Record Number: [id number] Patient Account Number: [id number] Date of Birth/Sex: 1976-01-03 (42 y.o. F) Treating RN: Cornell Barman Primary Care Patrice Moates: Priscille Kluver Other Clinician: Referring Alfhild Partch: Priscille Kluver Treating Laterrance Nauta/Extender: Melburn Hake, HOYT Weeks in Treatment: 3 Active Problems Location of Pain Severity and Description of Pain Patient Has Paino No Site Locations Pain Management and Medication Current Pain Management: Electronic Signature(s) Signed: 08/30/2018 5:47:28 PM By: Gretta Cool, BSN, RN, CWS, [name] RN, BSN Entered By: Gretta Cool, BSN, RN, CWS, [name] on 08/30/2018 12:39:59 [name] ([id number]) -------------------------------------------------------------------------------- Patient/Caregiver Education Details Patient Name: Heather Dear A. Date of Service: 08/30/2018 12:30 PM Medical Record Number: [id number] Patient Account Number: [id number] Date of Birth/Gender: 09-28-1976 (42 y.o. F) Treating RN: Cornell Barman Primary Care Physician: Priscille Kluver Other Clinician: Referring Physician: Priscille Kluver Treating Physician/Extender: Sharalyn Ink in Treatment: 3 Education Assessment Education Provided To: Patient Education Topics Provided Notes Follow up with PCP for diabetic shoes Electronic Signature(s) Signed: 08/30/2018 5:47:28 PM By: Gretta Cool, BSN, RN, CWS, [name] RN, BSN Entered By: Gretta Cool, BSN, RN, CWS, [name] on 08/30/2018 13:07:00 [name] ([id number]) -------------------------------------------------------------------------------- Wound Assessment Details Patient Name: Heather Dear A. Date of Service: 08/30/2018 12:30 PM Medical Record Number: [id number] Patient Account  Number: [id number] Date of Birth/Sex: 1976/07/25 (42 y.o. F) Treating RN: Cornell Barman Primary Care Nivaan Dicenzo: Priscille Kluver Other Clinician: Referring Lacheryl Niesen: Priscille Kluver Treating Audrie Kuri/Extender: STONE III, HOYT Weeks in Treatment: 3 Wound Status Wound Number: 4 Primary Diabetic Wound/Ulcer of the Lower Extremity Etiology: Wound Location: Left Toe Great Wound Healed - Epithelialized Wounding Event: Gradually Appeared Status: Date Acquired: 07/24/2018 Comorbid Chronic Obstructive Pulmonary Disease Weeks Of Treatment: 3 History: (COPD), Arrhythmia, Type II Diabetes, Clustered Wound: No Neuropathy, Seizure Disorder, Confinement Pending Amputation On Presentation Anxiety Photos Photo Uploaded By: Gretta Cool, BSN, RN, CWS, [name] on 08/30/2018 15:31:13 Wound Measurements Length: (cm) 0 % Redu Width: (cm) 0 % Redu Depth: (cm) 0 Epithe Area: (cm) 0 Tunne Volume: (cm) 0 Under ction in Area: 100% ction in Volume: 100% lialization: Large (67-100%) ling: No mining: No Wound Description Classification: Grade 1 Wound Bed Granulation Amount: None Present (0%) Necrotic Amount: None Present (0%) Periwound Skin Texture Texture Color No Abnormalities Noted: No No Abnormalities Noted: No Callus: Yes Atrophie Blanche: No Crepitus: No Cyanosis: No Excoriation: No  Ecchymosis: No Induration: No Erythema: No Rash: No Hemosiderin Staining: No Scarring: No Mottled: No [name], [name] ([id number]) Moisture Pallor: No No Abnormalities Noted: No Rubor: No Dry / Scaly: No Maceration: No Electronic Signature(s) Signed: 08/30/2018 5:47:28 PM By: Gretta Cool, BSN, RN, CWS, [name] RN, BSN Entered By: Gretta Cool, BSN, RN, CWS, [name] on 08/30/2018 12:58:55 [name] ([id number]) -------------------------------------------------------------------------------- Vitals Details Patient Name: Heather Dear A. Date of Service: 08/30/2018 12:30 PM Medical Record Number: [id number] Patient Account  Number: [id number] Date of Birth/Sex: 10-13-1976 (42 y.o. F) Treating RN: Cornell Barman Primary Care Brycen Bean: Priscille Kluver Other Clinician: Referring Preslee Regas: Priscille Kluver Treating Tameyah Koch/Extender: Melburn Hake, HOYT Weeks in Treatment: 3 Vital Signs Time Taken: 12:40 Temperature (F): 98.5 Height (in): 69 Pulse (bpm): 92 Weight (lbs): 240 Respiratory Rate (breaths/min): 16 Body Mass Index (BMI): 35.4 Blood Pressure (mmHg): 110/68 Reference Range: 80 - 120 mg / dl Electronic Signature(s) Signed: 08/30/2018 5:47:28 PM By: Gretta Cool, BSN, RN, CWS, [name] RN, BSN Entered By: Gretta Cool, BSN, RN, CWS, [name] on 08/30/2018 12:44:53

## 2018-09-11 ENCOUNTER — Encounter: Payer: Medicaid Other | Admitting: Physician Assistant

## 2018-09-11 DIAGNOSIS — L97522 Non-pressure chronic ulcer of other part of left foot with fat layer exposed: Secondary | ICD-10-CM | POA: Diagnosis not present

## 2018-09-14 NOTE — Progress Notes (Signed)
[name], [name] ([id number]) Visit Report for 09/11/2018 Chief Complaint Document Details Patient Name: [name], [name] Date of Service: 09/11/2018 2:15 PM Medical Record Number: [id number] Patient Account Number: [id number] Date of Birth/Sex: [birth year] (42 y.o. F) Treating RN: Montey Hora Primary Care Provider: Priscille Kluver Other Clinician: Referring Provider: Priscille Kluver Treating Provider/Extender: Melburn Hake, Isabellarose Kope Weeks in Treatment: 5 Information Obtained from: Patient Chief Complaint She is here for evaluation of a left great toe ulcer Electronic Signature(s) Signed: 09/11/2018 5:24:21 PM By: Worthy Keeler PA-C Entered By: Worthy Keeler on 09/11/2018 14:12:01 [name] ([id number]) -------------------------------------------------------------------------------- Debridement Details Patient Name: Heather Dear A. Date of Service: 09/11/2018 2:15 PM Medical Record Number: [id number] Patient Account Number: [id number] Date of Birth/Sex: 05-Dec-1975 (42 y.o. F) Treating RN: Montey Hora Primary Care Provider: Priscille Kluver Other Clinician: Referring Provider: Priscille Kluver Treating Provider/Extender: STONE III, Lendon George Weeks in Treatment: 5 Debridement Performed for Wound #4R Left Toe Great Assessment: Performed By: Physician STONE III, Jonette Wassel E., PA-C Debridement Type: Debridement Severity of Tissue Pre Fat layer exposed Debridement: Level of Consciousness (Pre- Awake and Alert procedure): Pre-procedure Verification/Time Yes - 14:55 Out Taken: Start Time: 14:55 Pain Control: Lidocaine 4% Topical Solution Total Area Debrided (L x W): 0.4 (cm) x 0.2 (cm) = 0.08 (cm) Tissue and other material Callus, Slough, Subcutaneous, Slough debrided: Level: Skin/Subcutaneous Tissue Debridement Description: Excisional Instrument: Curette Bleeding: None End Time: 14:55 Procedural Pain: 0 Post Procedural Pain: 0 Response to Treatment: Procedure was tolerated  well Level of Consciousness Awake and Alert (Post-procedure): Post Debridement Measurements of Total Wound Length: (cm) 0.4 Width: (cm) 0.2 Depth: (cm) 0.4 Volume: (cm) 0.025 Character of Wound/Ulcer Post Debridement: Improved Severity of Tissue Post Debridement: Fat layer exposed Post Procedure Diagnosis Same as Pre-procedure Electronic Signature(s) Signed: 09/11/2018 4:52:40 PM By: Montey Hora Signed: 09/11/2018 5:24:21 PM By: Worthy Keeler PA-C Entered By: Montey Hora on 09/11/2018 15:00:14 [name], [name] ([id number]) -------------------------------------------------------------------------------- HPI Details Patient Name: Heather Dear A. Date of Service: 09/11/2018 2:15 PM Medical Record Number: [id number] Patient Account Number: [id number] Date of Birth/Sex: 1976/03/25 (42 y.o. F) Treating RN: Montey Hora Primary Care Provider: Priscille Kluver Other Clinician: Referring Provider: Priscille Kluver Treating Provider/Extender: Melburn Hake, Cayleen Benjamin Weeks in Treatment: 5 History of Present Illness HPI Description: 01/18/18-She is here for initial evaluation of the left great toe ulcer. She is a poor historian in regards to timeframe in detail. She states approximately 4 weeks ago she lacerated her toe on something in the house. She followed up with her primary care who placed her on Bactrim and ultimately a second dose of Bactrim prior to coming to wound clinic. She states she has been treating the toe with peroxide, Betadine and a Band-Aid. She did not check her blood sugar this morning but checked it yesterday morning it was 327; she is unaware of a recent A1c and there are no current records. She saw Dr. she would've orthopedics last week for an old injury to the left ankle, she states he did not see her toe, nor did she bring it to his attention. She smokes approximately 1 pack cigarettes a day. Her social situation is concerning, she arrives this morning with her mother  who appears extremely intoxicated/under the influence; her mother was asked to leave the room and be monitored by the patient's grandmother. The patient's aunt then accompanied the patient and the room throughout the rest of the appointment. We had a lengthy discussion regarding the deleterious effects of uncontrolled hyperglycemia  and smoking as it relates to wound healing and overall health. She was strongly encouraged to decrease her smoking and get her diabetes under better control. She states she is currently on a diet and has cut down her [facility] consumption. The left toe is erythematous, macerated and slightly edematous with malodor present. The edema in her left foot is below her baseline, there is no erythema streaking. We will treat her with Santyl, doxycycline; we have ordered and xray, culture and provided a Peg assist surgical shoe and cultured the wound. 01/25/18-She is here in follow-up evaluation for a left great toe ulcer and presents with an abscess to her suprapubic area. She states her blood sugars remain elevated, feeling "sick" and if levels are below 250, but she is trying. She has made no attempt to decrease her smoking stating that we "can't take away her food in her cigarettes". She has been compliant with offloading using the PEG assist you. She is using Santyl daily. the culture obtained last week grew staph aureus and Enterococcus faecalis; continues on the doxycycline and Augmentin was added on Monday. The suprapubic area has erythema, no femoral variation, purple discoloration, minimal induration, was accessed with a cotton tip applicator with sanguinopurulent drainage, this was cultured, I suspect the current antibiotic treatment will cover and we will not add anything to her current treatment plan. She was advised to go to urgent care or ER with any change in redness, induration or fever. 02/01/18-She is here in follow-up evaluation for left great toe ulcers and a  new abdominal abscess from last week. She was able to use packing until earlier this week, where she "forgot it was there". She states she was feeling ill with GI symptoms last week and was not taking her antibiotic. She states her glucose levels have been predominantly less than 200, with occasional levels between 200-250. She thinks this was contributing to her GI symptoms as they have resolved without intervention. There continues to be significant laceration to left toe, otherwise it clinically looks stable/improved. There is now less superficial opening to the lateral aspect of the great toe that was residual blister. We will transition to [facility] to all wounds, she will continue her Augmentin. If there is no change or deterioration next week for reculture. 02/08/18-She is here in follow-up evaluation for left great toe ulcer and abdominal ulcer. There is an improvement in both wounds. She has been wrapping her left toe with coban, not by our direction, which has created an area of discoloration to the medial aspect; she has been advised to NOT use coban secondary to her neuropathy. She states her glucose levels have been high over this last week ranging from 200-350, she continues to smoke. She admits to being less compliant with her offloading shoe. We will continue with same treatment plan and she will follow-up next week. 02/15/18-She is here in follow-up evaluation for left great toe ulcer and abdominal ulcer. The abdominal ulcer is epithelialized. The left great toe ulcer is improved and all injury from last week using the Coban wrap is resolved, the lateral ulcer is healed. She admits to noncompliance with wearing offloading shoe and admits to glucose levels being greater than 300 most of the week. She continues to smoke and expresses no desire to quit. There is one area medially that probes deeper than it has historically, erythema to the toe and dorsal foot has consistently waxed  and waned. There is no overt signs of cellulitis or infection  but we will culture the wound for any occult infection given the new area of depth and erythema. We will hold off on sensitivities for initiation of antibiotic therapy. 02/22/18-She is here in follow up evaluation for left great toe ulcer. There is overall significant improvement in both wound appearance, erythema and edema with changes made last week. She was not initiated on antibiotic therapy. Culture obtained last week showed oxacillin sensitive staph aureus, sensitive to clindamycin. Clindamycin has been called into the pharmacy but she has been instructed to hold off on initiation secondary to overall clinical improvement and her history of antibiotic intolerance. She has been instructed to contact the clinic with any noted changes/deterioration and the wound, erythema, Grandfield, [name] ([id number]) edema and/or pain. She will follow-up next week. She continues to smoke and her glucose levels remain elevated >250; she admits to compliance with offloading shoe 03/01/18 on evaluation today patient appears to be doing fairly well in regard to her left first toe ulcer. She has been tolerating the dressing changes with the [facility] Dressing without complication and overall this has definitely showed signs of improvement according to records as well is what the patient tells me today. I'm very pleased in that regard. She is having no pain today 03/08/18 She is here for follow up evaluation of a left great toe ulcer. She remains non-compliant with glucose control and smoking cessation; glucose levels consistently >200. She states that she got new shoe inserts/peg assist. She admits to compliance with offloading. Since my last evaluation there is significant improvement. We will switch to prisma at this time and she will follow up next week. She is noted to be tachycardic at this appointment, heart rate 120s; she has a history of  heart rate 70-130 according to our records. She admits to extreme agitation r/t personal issues; she was advised to monitor her heartrate and contact her physician if it does not return to a more normal range (<100). She takes cardizem twice daily. 03/15/18-She is here in follow-up evaluation for left great toe ulcer. She remains noncompliant with glucose control and smoking cessation. She admits to compliance with wearing offloading shoe. The ulcer is improved/stable and we will continue with the same treatment plan and she will follow-up next week 03/22/18-She is here for evaluation for left great toe ulcer. There continues to be significant improvement despite recurrent hyperglycemia (over 500 yesterday) and she continues to smoke. She has been compliant with offloading and we will continue with same treatment plan and she will follow-up next week. 03/29/18-She is here for evaluation for left great toe ulcer. Despite continuing to smoke and uncontrolled diabetes she continues to improve. She is compliant with offloading shoe. We will continue with the same treatment plan and she will follow-up next week 04/05/18- She is here in follow up evaluation for a left great toe ulcer; she presents with small pustule to left fifth toe (resembles ant bite). She admits to compliance with wearing offloading shoe; continues to smoke or have uncontrolled blood glucose control. There is more callus than usual with evidence of bleeding; she denies known trauma. 04/12/18-She is here for evaluation of left great toe ulcer. Despite noncompliance with glycemic control and smoking she continues to make improvement. She continues to wear offloading shoe. The pustule, that was identified last week, to the left fifth toe is resolved. She will follow-up in 2 weeks 05/03/18-she is seen in follow-up evaluation for a left great toe ulcer. She is compliant with offloading, otherwise  noncompliant with glycemic control and smoking.  She has plateaued and there is minimal improvement noted. We will transition to [facility], replaced the insert to her surgical shoe and she will follow-up in one week 05/10/18- She is here in follow up evaluation for a left great toe ulcer. It appears stable despite measurement change. We will continue with same treatment plan and follow up next week. 05/24/18-She is seen in follow-up evaluation for a left great toe ulcer. She remains compliant with offloading, has made significant improvement in her diet, decreasing the amount of sugar/soda. She said her recent A1c was 10.9 which is lower than. She did see a diabetic nutritionist/educator yesterday. She continues to smoke. We will continue with the same treatment plan and she'll follow-up next week. 05/31/18- She is seen in follow-up evaluation for left great toe ulcer. She continues to remain compliant with offloading, continues to make improvement in her diet, increasing her water and decreasing the amount of sugar/soda. She does continue to smoke with no desire to quit. We will apply Prisma to the depth and Hydrofera Blue over. We have not received insurance authorization for oasis. She will follow up next week. 06/07/18-She is seen in follow-up evaluation for left great toe ulcer. It has stalled according to today's measurements although base appears stable. She says she saw a diabetic educator yesterday; her average blood sugars are less than 300 which is an improvement for her. She continues to smoke and states "that's my next step" She continues with water over soda. We will order for xray, culture and reinstate ace wrap compression prior to placing apligraf for next week. She is voicing no complaints or concerns. Her dressing will change to iodoflex over the next week in preparation for apligraf. 06/14/18-She is seen in follow-up evaluation for left great toe ulcer. Plain film x-ray performed last week was negative for osteomyelitis. Wound  culture obtained last week grew strep B and OSSA; she is initiated on keflex and cefdinir today; there is erythema to the toe which could be from ace wrap compression, she has a history of wrapping too tight and has has been encouraged to maintain ace wraps that we place today. We will hold off on application of apligraf today, will apply next week after antibiotic therapy has been initiated. She admits today that she has resumed taking a shower with her foot/toe submerged in water, she has been reminded to keep foot/toe out of the bath water. She will be seen in follow up next week 06/21/18-she is seen in follow-up evaluation for left great toe ulcer. She is tolerating antibiotic therapy with no GI disturbance. The wound is stable. Apligraf was applied today. She has been decreasing her smoking, only had 4 cigarettes yesterday and 1 today. She continues being more compliant in diabetic diet. She will follow-up next week for evaluation of site, if stable will remove at 2 weeks. 06/28/18- She is here in follow up evalution. Apligraf was placed last week, she states the dressing fell off on Tuesday and she was dressing with hydrofera blue. She is healed and will be discharged from the clinic today. She has been instructed to continue with smoking cessation, continue monitoring glucose levels, offloading for an additional 4 weeks and continue with Sunday, Araiya A. ([id number]) hydrofera blue for additional two weeks for any possible microscopic opening. Readmission: 08/07/18 on evaluation today patient presents for reevaluation concerning the ulcer of her right great toe. She was previously discharged on 06/28/18 healed. Nonetheless she states  that this began to show signs of drainage she subsequently went to her primary care provider. Subsequently an x-ray was performed on 08/01/18 which was negative. The patient was also placed on antibiotics at that time. Fortunately they should have been effective for  the infection. Nonetheless she's been experiencing some improvement but still has a lot of drainage coming from the wound itself. 08/14/18 on evaluation today patient's wound actually does show signs of improvement in regard to the erythema at this point. She has completed the antibiotics. With that being said we did discuss the possibility of placing her in a total contact cast as of today although I think that I may want to give this just a little bit more time to ensure nothing recurrence as far as her infection is concerned. I do not want to put in the cast and risk infection at that time if things are not completely resolved. With that being said she is gonna require some debridement today. 08/21/18 on evaluation today patient actually appears to be doing okay in regard to her toe ulcer. She's been tolerating the dressing changes without complication. With that being said it does appear that she is ready and in fact I think it's appropriate for [place] to go ahead and initiate the total contact cast today. Nonetheless she will require some sharp debridement to prepare the wound for application. Overall I feel like things have been progressing well but we do need to do something to get this to close more readily. 08/24/18 patient seen today for reevaluation after having had the total contact cast applied on Tuesday. She seems to have done very well the wound appears to be doing great and overall I'm pleased with the progress that she's made. There were no abnormal areas of rubbing from the cast on her lower extremity. 08/30/18 on evaluation today patient actually appears to be completely healed in regard to her plantar toe ulcer. She tells me at this point she's been having a lot of issues with the cast. She almost fell a couple of times the state shall the step of her dog a couple times as well. This is been a very frustrating process for her other nonetheless she has completely healed the  wound which is excellent news. Overall there does not appear to be the evidence of infection at this time which is great news. 09/11/18 evaluation today patient presents for follow-up concerning her great toe ulcer on the left which has unfortunately reopened since I last saw her which was only a couple of weeks ago. Unfortunately she was not able to get in to get the shoe and potentially the AFO that's gonna be necessary due to her left foot drop. She continues with offloading shoe but this is not enough to prevent her from reopening it appears. When we last had her in the total contact cast she did well from a healing standpoint but unfortunately the wound reopened as soon as she came out of the cast within just a couple of weeks. 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 This is also part of the reason why she falls on a regular basis. I just do not believe that is gonna be safe for [place] to reinitiate the total contact cast as last time  we had this on she fell 3 times one week which is definitely not normal for her. Electronic Signature(s) Signed: 09/11/2018 5:24:21 PM By: Worthy Keeler PA-C Entered By: Worthy Keeler on 09/11/2018 17:12:57 [name] ([id number]) -------------------------------------------------------------------------------- Physical Exam Details Patient Name: Heather Dear A. Date of Service: 09/11/2018 2:15 PM Medical Record Number: [id number] Patient Account Number: [id number] Date of Birth/Sex: [birth year] (42 y.o. F) Treating RN: Montey Hora Primary Care Provider: Priscille Kluver Other Clinician: Referring Provider: Priscille Kluver Treating Provider/Extender: STONE III, Neli Fofana Weeks in Treatment: 5 Constitutional Well-nourished and well-hydrated in no acute  distress. Respiratory normal breathing without difficulty. clear to auscultation bilaterally. Cardiovascular regular rate and rhythm with normal S1, S2. Psychiatric this patient is able to make decisions and demonstrates good insight into disease process. Alert and Oriented x 3. pleasant and cooperative. Notes Patient's wound currently did have some callous run of the wound bed that was also an opening centrally I did clean away the callous as well is the slough and necrotic material from the surface of the wound she tolerated all this today without complication. Electronic Signature(s) Signed: 09/11/2018 5:24:21 PM By: Worthy Keeler PA-C Entered By: Worthy Keeler on 09/11/2018 17:13:52 [name] ([id number]) -------------------------------------------------------------------------------- Physician Orders Details Patient Name: Heather Dear A. Date of Service: 09/11/2018 2:15 PM Medical Record Number: [id number] Patient Account Number: [id number] Date of Birth/Sex: 05-31-1976 (42 y.o. F) Treating RN: Montey Hora Primary Care Provider: Priscille Kluver Other Clinician: Referring Provider: Priscille Kluver Treating Provider/Extender: Melburn Hake, Brynnleigh Mcelwee Weeks in Treatment: 5 Verbal / Phone Orders: No Diagnosis Coding ICD-10 Coding Code Description E11.621 Type 2 diabetes mellitus with foot ulcer L03.032 Cellulitis of left toe L97.522 Non-pressure chronic ulcer of other part of left foot with fat layer exposed Wound Cleansing Wound #4R Left Toe Great o Cleanse wound with mild soap and water Anesthetic (add to Medication List) Wound #4R Left Toe Great o Topical Lidocaine 4% cream applied to wound bed prior to debridement (In Clinic Only). Primary Wound Dressing Wound #4R Left Toe Great o Silver Collagen Secondary Dressing Wound #4R Left Toe Great o Dry Gauze o Conform/Kerlix Dressing Change Frequency Wound #4R Left Toe Great o Change dressing every other  day. Follow-up Appointments Wound #4R Left Toe Great o Return Appointment in 1 week. Off-Loading o Other: - Referral to [employer] and [employer] for AFO Chartered loss adjuster) Signed: 09/11/2018 4:52:40 PM By: Montey Hora Signed: 09/11/2018 5:24:21 PM By: Ronn Melena, Blasa A. ([id number]) Entered By: Montey Hora on 09/11/2018 15:03:37 [name] ([id number]) -------------------------------------------------------------------------------- Problem List Details Patient Name: Heather Dear A. Date of Service: 09/11/2018 2:15 PM Medical Record Number: [id number] Patient Account Number: [id number] Date of Birth/Sex: 09/10/1976 (42 y.o. F) Treating RN: Montey Hora Primary Care Provider: Priscille Kluver Other Clinician: Referring Provider: Priscille Kluver Treating Provider/Extender: Melburn Hake, Numan Zylstra Weeks in Treatment: 5 Active Problems ICD-10 Evaluated Encounter Code Description Active Date Today Diagnosis E11.621 Type 2 diabetes mellitus with foot ulcer 08/07/2018 No Yes L97.522 Non-pressure chronic ulcer of other part of left foot with fat 08/08/2018 No Yes layer exposed M21.372 Foot drop, left foot 09/11/2018 No Yes Inactive Problems Resolved Problems ICD-10 Code Description Active Date Resolved Date L03.032 Cellulitis of left toe 08/07/2018 08/07/2018 Electronic Signature(s) Signed: 09/11/2018 5:24:21 PM By: Worthy Keeler PA-C Entered By: Worthy Keeler on 09/11/2018 15:08:42 [name], [name] ([id number]) -------------------------------------------------------------------------------- Progress Note Details Patient Name: Heather Dear A. Date of Service: 09/11/2018 2:15 PM  Medical Record Number: [id number] Patient Account Number: [id number] Date of Birth/Sex: 1975-12-30 (42 y.o. F) Treating RN: Montey Hora Primary Care Provider: Priscille Kluver Other Clinician: Referring Provider: Priscille Kluver Treating Provider/Extender:  Melburn Hake, Hava Massingale Weeks in Treatment: 5 Subjective Chief Complaint Information obtained from Patient She is here for evaluation of a left great toe ulcer History of Present Illness (HPI) 01/18/18-She is here for initial evaluation of the left great toe ulcer. She is a poor historian in regards to timeframe in detail. She states approximately 4 weeks ago she lacerated her toe on something in the house. She followed up with her primary care who placed her on Bactrim and ultimately a second dose of Bactrim prior to coming to wound clinic. She states she has been treating the toe with peroxide, Betadine and a Band-Aid. She did not check her blood sugar this morning but checked it yesterday morning it was 327; she is unaware of a recent A1c and there are no current records. She saw Dr. she would've orthopedics last week for an old injury to the left ankle, she states he did not see her toe, nor did she bring it to his attention. She smokes approximately 1 pack cigarettes a day. Her social situation is concerning, she arrives this morning with her mother who appears extremely intoxicated/under the influence; her mother was asked to leave the room and be monitored by the patient's grandmother. The patient's aunt then accompanied the patient and the room throughout the rest of the appointment. We had a lengthy discussion regarding the deleterious effects of uncontrolled hyperglycemia and smoking as it relates to wound healing and overall health. She was strongly encouraged to decrease her smoking and get her diabetes under better control. She states she is currently on a diet and has cut down her [place] [employer] consumption. The left toe is erythematous, macerated and slightly edematous with malodor present. The edema in her left foot is below her baseline, there is no erythema streaking. We will treat her with Santyl, doxycycline; we have ordered and xray, culture and provided a Peg assist surgical shoe and  cultured the wound. 01/25/18-She is here in follow-up evaluation for a left great toe ulcer and presents with an abscess to her suprapubic area. She states her blood sugars remain elevated, feeling "sick" and if levels are below 250, but she is trying. She has made no attempt to decrease her smoking stating that we "can't take away her food in her cigarettes". She has been compliant with offloading using the PEG assist you. She is using Santyl daily. the culture obtained last week grew staph aureus and Enterococcus faecalis; continues on the doxycycline and Augmentin was added on Monday. The suprapubic area has erythema, no femoral variation, purple discoloration, minimal induration, was accessed with a cotton tip applicator with sanguinopurulent drainage, this was cultured, I suspect the current antibiotic treatment will cover and we will not add anything to her current treatment plan. She was advised to go to urgent care or ER with any change in redness, induration or fever. 02/01/18-She is here in follow-up evaluation for left great toe ulcers and a new abdominal abscess from last week. She was able to use packing until earlier this week, where she "forgot it was there". She states she was feeling ill with GI symptoms last week and was not taking her antibiotic. She states her glucose levels have been predominantly less than 200, with occasional levels between 200-250. She thinks this was contributing to her GI  symptoms as they have resolved without intervention. There continues to be significant laceration to left toe, otherwise it clinically looks stable/improved. There is now less superficial opening to the lateral aspect of the great toe that was residual blister. We will transition to [facility] to all wounds, she will continue her Augmentin. If there is no change or deterioration next week for reculture. 02/08/18-She is here in follow-up evaluation for left great toe ulcer and abdominal  ulcer. There is an improvement in both wounds. She has been wrapping her left toe with coban, not by our direction, which has created an area of discoloration to the medial aspect; she has been advised to NOT use coban secondary to her neuropathy. She states her glucose levels have been high over this last week ranging from 200-350, she continues to smoke. She admits to being less compliant with her offloading shoe. We will continue with same treatment plan and she will follow-up next week. 02/15/18-She is here in follow-up evaluation for left great toe ulcer and abdominal ulcer. The abdominal ulcer is epithelialized. The left great toe ulcer is improved and all injury from last week using the Coban wrap is resolved, the lateral ulcer is healed. She admits to noncompliance with wearing offloading shoe and admits to glucose levels being greater than 300 most of the week. She continues to smoke and expresses no desire to quit. There is one area medially that probes deeper than it has historically, erythema to the toe and dorsal foot has consistently waxed and waned. There is no overt signs of cellulitis or infection but we will culture the wound for any occult infection given the new area of depth and erythema. We will hold off on Mclear, Aisa A. ([id number]) sensitivities for initiation of antibiotic therapy. 02/22/18-She is here in follow up evaluation for left great toe ulcer. There is overall significant improvement in both wound appearance, erythema and edema with changes made last week. She was not initiated on antibiotic therapy. Culture obtained last week showed oxacillin sensitive staph aureus, sensitive to clindamycin. Clindamycin has been called into the pharmacy but she has been instructed to hold off on initiation secondary to overall clinical improvement and her history of antibiotic intolerance. She has been instructed to contact the clinic with any noted changes/deterioration and the  wound, erythema, edema and/or pain. She will follow-up next week. She continues to smoke and her glucose levels remain elevated >250; she admits to compliance with offloading shoe 03/01/18 on evaluation today patient appears to be doing fairly well in regard to her left first toe ulcer. She has been tolerating the dressing changes with the [facility] Dressing without complication and overall this has definitely showed signs of improvement according to records as well is what the patient tells me today. I'm very pleased in that regard. She is having no pain today 03/08/18 She is here for follow up evaluation of a left great toe ulcer. She remains non-compliant with glucose control and smoking cessation; glucose levels consistently >200. She states that she got new shoe inserts/peg assist. She admits to compliance with offloading. Since my last evaluation there is significant improvement. We will switch to prisma at this time and she will follow up next week. She is noted to be tachycardic at this appointment, heart rate 120s; she has a history of heart rate 70-130 according to our records. She admits to extreme agitation r/t personal issues; she was advised to monitor her heartrate and contact her physician if it does  not return to a more normal range (<100). She takes cardizem twice daily. 03/15/18-She is here in follow-up evaluation for left great toe ulcer. She remains noncompliant with glucose control and smoking cessation. She admits to compliance with wearing offloading shoe. The ulcer is improved/stable and we will continue with the same treatment plan and she will follow-up next week 03/22/18-She is here for evaluation for left great toe ulcer. There continues to be significant improvement despite recurrent hyperglycemia (over 500 yesterday) and she continues to smoke. She has been compliant with offloading and we will continue with same treatment plan and she will follow-up next  week. 03/29/18-She is here for evaluation for left great toe ulcer. Despite continuing to smoke and uncontrolled diabetes she continues to improve. She is compliant with offloading shoe. We will continue with the same treatment plan and she will follow-up next week 04/05/18- She is here in follow up evaluation for a left great toe ulcer; she presents with small pustule to left fifth toe (resembles ant bite). She admits to compliance with wearing offloading shoe; continues to smoke or have uncontrolled blood glucose control. There is more callus than usual with evidence of bleeding; she denies known trauma. 04/12/18-She is here for evaluation of left great toe ulcer. Despite noncompliance with glycemic control and smoking she continues to make improvement. She continues to wear offloading shoe. The pustule, that was identified last week, to the left fifth toe is resolved. She will follow-up in 2 weeks 05/03/18-she is seen in follow-up evaluation for a left great toe ulcer. She is compliant with offloading, otherwise noncompliant with glycemic control and smoking. She has plateaued and there is minimal improvement noted. We will transition to [facility], replaced the insert to her surgical shoe and she will follow-up in one week 05/10/18- She is here in follow up evaluation for a left great toe ulcer. It appears stable despite measurement change. We will continue with same treatment plan and follow up next week. 05/24/18-She is seen in follow-up evaluation for a left great toe ulcer. She remains compliant with offloading, has made significant improvement in her diet, decreasing the amount of sugar/soda. She said her recent A1c was 10.9 which is lower than. She did see a diabetic nutritionist/educator yesterday. She continues to smoke. We will continue with the same treatment plan and she'll follow-up next week. 05/31/18- She is seen in follow-up evaluation for left great toe ulcer. She continues to remain  compliant with offloading, continues to make improvement in her diet, increasing her water and decreasing the amount of sugar/soda. She does continue to smoke with no desire to quit. We will apply Prisma to the depth and Hydrofera Blue over. We have not received insurance authorization for oasis. She will follow up next week. 06/07/18-She is seen in follow-up evaluation for left great toe ulcer. It has stalled according to today's measurements although base appears stable. She says she saw a diabetic educator yesterday; her average blood sugars are less than 300 which is an improvement for her. She continues to smoke and states "that's my next step" She continues with water over soda. We will order for xray, culture and reinstate ace wrap compression prior to placing apligraf for next week. She is voicing no complaints or concerns. Her dressing will change to iodoflex over the next week in preparation for apligraf. 06/14/18-She is seen in follow-up evaluation for left great toe ulcer. Plain film x-ray performed last week was negative for osteomyelitis. Wound culture obtained last week grew  strep B and OSSA; she is initiated on keflex and cefdinir today; there is erythema to the toe which could be from ace wrap compression, she has a history of wrapping too tight and has has been encouraged to maintain ace wraps that we place today. We will hold off on application of apligraf today, will apply next week after antibiotic therapy has been initiated. She admits today that she has resumed taking a shower with her foot/toe submerged in water, she has been reminded to keep foot/toe out of the bath water. She will be seen in follow up next week 06/21/18-she is seen in follow-up evaluation for left great toe ulcer. She is tolerating antibiotic therapy with no GI disturbance. The wound is stable. Apligraf was applied today. She has been decreasing her smoking, only had 4 cigarettes yesterday and [name], Heather  A. ([id number]) 1 today. She continues being more compliant in diabetic diet. She will follow-up next week for evaluation of site, if stable will remove at 2 weeks. 06/28/18- She is here in follow up evalution. Apligraf was placed last week, she states the dressing fell off on Tuesday and she was dressing with hydrofera blue. She is healed and will be discharged from the clinic today. She has been instructed to continue with smoking cessation, continue monitoring glucose levels, offloading for an additional 4 weeks and continue with hydrofera blue for additional two weeks for any possible microscopic opening. Readmission: 08/07/18 on evaluation today patient presents for reevaluation concerning the ulcer of her right great toe. She was previously discharged on 06/28/18 healed. Nonetheless she states that this began to show signs of drainage she subsequently went to her primary care provider. Subsequently an x-ray was performed on 08/01/18 which was negative. The patient was also placed on antibiotics at that time. Fortunately they should have been effective for the infection. Nonetheless she's been experiencing some improvement but still has a lot of drainage coming from the wound itself. 08/14/18 on evaluation today patient's wound actually does show signs of improvement in regard to the erythema at this point. She has completed the antibiotics. With that being said we did discuss the possibility of placing her in a total contact cast as of today although I think that I may want to give this just a little bit more time to ensure nothing recurrence as far as her infection is concerned. I do not want to put in the cast and risk infection at that time if things are not completely resolved. With that being said she is gonna require some debridement today. 08/21/18 on evaluation today patient actually appears to be doing okay in regard to her toe ulcer. She's been tolerating the dressing changes without  complication. With that being said it does appear that she is ready and in fact I think it's appropriate for [place] to go ahead and initiate the total contact cast today. Nonetheless she will require some sharp debridement to prepare the wound for application. Overall I feel like things have been progressing well but we do need to do something to get this to close more readily. 08/24/18 patient seen today for reevaluation after having had the total contact cast applied on Tuesday. She seems to have done very well the wound appears to be doing great and overall I'm pleased with the progress that she's made. There were no abnormal areas of rubbing from the cast on her lower extremity. 08/30/18 on evaluation today patient actually appears to be completely healed in regard to her  plantar toe ulcer. She tells me at this point she's been having a lot of issues with the cast. She almost fell a couple of times the state shall the step of her dog a couple times as well. This is been a very frustrating process for her other nonetheless she has completely healed the wound which is excellent news. Overall there does not appear to be the evidence of infection at this time which is great news. 09/11/18 evaluation today patient presents for follow-up concerning her great toe ulcer on the left which has unfortunately reopened since I last saw her which was only a couple of weeks ago. Unfortunately she was not able to get in to get the shoe and potentially the AFO that's gonna be necessary due to her left foot drop. She continues with offloading shoe but this is not enough to prevent her from reopening it appears. When we last had her in the total contact cast she did well from a healing standpoint but unfortunately the wound reopened as soon as she came out of the cast within just a couple of weeks. Right now the biggest concern is that I do believe the foot drop is leading to the issue and this is gonna continue to be an  issue unfortunately until we get things under control as far as the walking anomaly is concerned with the foot drop. This is also part of the reason why she falls on a regular basis. I just do not believe that is gonna be safe for [place] to reinitiate the total contact cast as last time we had this on she fell 3 times one week which is definitely not normal for her. Patient History Information obtained from Patient. Social History Current every day smoker. Medical History Endocrine Patient has history of Type II Diabetes Patient is treated with Insulin, Oral Agents. Blood sugar is not tested. Blood sugar results noted at the following times: Lunch - 350. GENEEN, DIETER A. ([id number]) Review of Systems (ROS) Constitutional Symptoms (General Health) Denies complaints or symptoms of Fever, Chills. Respiratory The patient has no complaints or symptoms. Cardiovascular The patient has no complaints or symptoms. Psychiatric The patient has no complaints or symptoms. Objective Constitutional Well-nourished and well-hydrated in no acute distress. Vitals Time Taken: 2:16 PM, Height: 69 in, Weight: 240 lbs, BMI: 35.4, Temperature: 97.8 F, Pulse: 98 bpm, Respiratory Rate: 16 breaths/min, Blood Pressure: 90/62 mmHg. Respiratory normal breathing without difficulty. clear to auscultation bilaterally. Cardiovascular regular rate and rhythm with normal S1, S2. Psychiatric this patient is able to make decisions and demonstrates good insight into disease process. Alert and Oriented x 3. pleasant and cooperative. General Notes: Patient's wound currently did have some callous run of the wound bed that was also an opening centrally I did clean away the callous as well is the slough and necrotic material from the surface of the wound she tolerated all this today without complication. Integumentary (Hair, Skin) Wound #4R status is Open. Original cause of wound was Gradually Appeared. The wound is  located on the Left Toe Great. The wound measures 0.4cm length x 0.2cm width x 0.4cm depth; 0.063cm^2 area and 0.025cm^3 volume. There is Fat Layer (Subcutaneous Tissue) Exposed exposed. There is undermining starting at 11:00 and ending at 7:00 with a maximum distance of 0.4cm. There is a medium amount of serous drainage noted. The wound margin is thickened. There is no granulation within the wound bed. There is a large (67-100%) amount of necrotic tissue within the wound  bed including Eschar. The periwound skin appearance exhibited: Callus. The periwound skin appearance did not exhibit: Crepitus, Excoriation, Induration, Rash, Scarring, Dry/Scaly, Maceration, Atrophie Blanche, Cyanosis, Ecchymosis, Hemosiderin Staining, Mottled, Pallor, Rubor, Erythema. Assessment [name], [name] 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 There was a Excisional Skin/Subcutaneous Tissue Debridement with a total area of 0.08 sq cm performed by STONE III, Wilder Amodei E., PA-C. With the following instrument(s): Curette Material removed includes Callus, Subcutaneous Tissue, and Slough after achieving pain control using Lidocaine 4% Topical Solution. No specimens were taken. A time out was conducted at 14:55, prior to the start of the procedure. There was no bleeding. The procedure was tolerated well with a pain level of 0 throughout and a pain level of 0 following the procedure. Post Debridement Measurements: 0.4cm length x 0.2cm width x 0.4cm depth; 0.025cm^3 volume. Character of Wound/Ulcer Post Debridement is improved. Severity of Tissue Post Debridement is: Fat layer exposed. Post procedure  Diagnosis Wound #4R: Same as Pre-Procedure Plan Wound Cleansing: Wound #4R Left Toe Great: Cleanse wound with mild soap and water Anesthetic (add to Medication List): Wound #4R Left Toe Great: Topical Lidocaine 4% cream applied to wound bed prior to debridement (In Clinic Only). Primary Wound Dressing: Wound #4R Left Toe Great: Silver Collagen Secondary Dressing: Wound #4R Left Toe Great: Dry Gauze Conform/Kerlix Dressing Change Frequency: Wound #4R Left Toe Great: Change dressing every other day. Follow-up Appointments: Wound #4R Left Toe Great: Return Appointment in 1 week. Off-Loading: Other: - Referral to [facility] for AFO Consults ordered were: Podiatry [name], [name] ([id number]) My suggestion at this point is going to be that we go ahead and initiate the above wound care measures to attempt offloading as best we can considering the foot drop. Hopefully this will help to keep any pressure and friction from occurring at the toe until such a time if she can potentially get in with [facility] to discuss and AFO. I did write the prescription for this today as well. We will see how things stand until follow-up. Please see above for specific wound care orders. We will see patient for re-evaluation in 2 week(s) here in the clinic. If anything worsens or changes patient will contact our office for additional recommendations. Electronic Signature(s) Signed: 09/11/2018 5:24:21 PM By: Worthy Keeler PA-C Entered By: Worthy Keeler on 09/11/2018 17:14:05 [name] ([id number]) -------------------------------------------------------------------------------- ROS/PFSH Details Patient Name: Heather Dear A. Date of Service: 09/11/2018 2:15 PM Medical Record Number: [id number] Patient Account Number: [id number] Date of Birth/Sex: 08-Apr-1976 (42 y.o. F) Treating RN: Cornell Barman Primary Care Provider: Priscille Kluver Other Clinician: Referring Provider: Priscille Kluver Treating Provider/Extender: Melburn Hake, Thirza Pellicano Weeks in Treatment: 5 Information Obtained From Patient Wound History Constitutional Symptoms (General Health) Complaints and Symptoms: Negative for: Fever; Chills Eyes Medical History: Positive for: Cataracts; Glaucoma; Optic Neuritis Ear/Nose/Mouth/Throat Medical History: Positive for: Chronic sinus problems/congestion; Middle ear problems Respiratory Complaints and Symptoms: No Complaints or Symptoms Cardiovascular Complaints and Symptoms: No Complaints or Symptoms Endocrine Medical History: Positive for: Type II Diabetes Time with diabetes: 10 years Treated with: Insulin, Oral agents Blood sugar tested every day: No Blood sugar testing results: Lunch: 350 Psychiatric Complaints and  Symptoms: No Complaints or Symptoms HBO Extended History Items Ear/Nose/Mouth/Throat: Eyes: Eyes: Ear/Nose/Mouth/Throat: Chronic sinus Cataracts Glaucoma Middle ear problems problems/congestion [name], [name] ([id number]) Implantable Devices Family and Social History Current every day smoker Physician Affirmation I have reviewed and agree with the above information. Electronic Signature(s) Signed: 09/11/2018 5:24:21 PM By: Worthy Keeler PA-C Signed: 09/12/2018 5:21:06 PM By: [name], BSN, RN, CWS, [name] RN, BSN Entered By: Worthy Keeler on 09/11/2018 17:13:33 [name] ([id number]) -------------------------------------------------------------------------------- SuperBill Details Patient Name: Heather Dear A. Date of Service: 09/11/2018 Medical Record Number: [id number] Patient Account Number: [id number] Date of Birth/Sex: 1976/05/08 (42 y.o. F) Treating RN: Montey Hora Primary Care Provider: Priscille Kluver Other Clinician: Referring Provider: Priscille Kluver Treating Provider/Extender: Melburn Hake, Naturi Alarid Weeks in Treatment: 5 Diagnosis Coding ICD-10 Codes Code Description E11.621 Type 2 diabetes mellitus with foot  ulcer L97.522 Non-pressure chronic ulcer of other part of left foot with fat layer exposed M21.372 Foot drop, left foot Facility Procedures CPT4 Code: 91791505 Description: 69794 - DEB SUBQ TISSUE 20 SQ CM/< ICD-10 Diagnosis Description L97.522 Non-pressure chronic ulcer of other part of left foot with fat Modifier: layer exposed Quantity: 1 Physician Procedures CPT4 Code: 8016553 Description: 74827 - WC PHYS LEVEL 4 - EST PT ICD-10 Diagnosis Description E11.621 Type 2 diabetes mellitus with foot ulcer L97.522 Non-pressure chronic ulcer of other part of left foot with fat M21.372 Foot drop, left foot Modifier: 25 layer exposed Quantity: 1 CPT4 Code: [id number] Description: 49201 - WC PHYS SUBQ TISS 20 SQ CM ICD-10 Diagnosis Description L97.522 Non-pressure chronic ulcer of other part of left foot with fat Modifier: layer exposed Quantity: 1 Electronic Signature(s) Signed: 09/11/2018 5:24:21 PM By: Worthy Keeler PA-C Entered By: Worthy Keeler on 09/11/2018 17:14:23

## 2018-09-14 NOTE — Progress Notes (Signed)
Heather, [name] ([id number]) Visit Report for 09/11/2018 Arrival Information Details Patient Name: [name], [name] Date of Service: 09/11/2018 2:15 PM Medical Record Number: [id number] Patient Account Number: [id number] Date of Birth/Sex: 02-Jan-1976 (42 y.o. F) Treating RN: Cornell Barman Primary Care Harry Bark: Priscille Kluver Other Clinician: Referring Joclyn Alsobrook: Priscille Kluver Treating Aletha Allebach/Extender: Melburn Hake, HOYT Weeks in Treatment: 5 Visit Information History Since Last Visit Added or deleted any medications: No Patient Arrived: Wheel Chair Any new allergies or adverse reactions: No Arrival Time: 14:15 Had a fall or experienced change in No Accompanied By: Mom activities of daily living that may affect Transfer Assistance: None risk of falls: Patient Identification Verified: Yes Signs or symptoms of abuse/neglect since last No Secondary Verification Process Completed: Yes visito Hospitalized since last visit: No Has Footwear/Offloading in Place as Yes Prescribed: Right: Surgical Shoe with Pressure Relief Insole Pain Present Now: No Electronic Signature(s) Signed: 09/12/2018 5:21:06 PM By: Gretta Cool, BSN, RN, CWS, [name] RN, BSN Entered By: Gretta Cool, BSN, RN, CWS, [name] on 09/11/2018 14:15:53 [name] ([id number]) -------------------------------------------------------------------------------- Encounter Discharge Information Details Patient Name: Heather Dear A. Date of Service: 09/11/2018 2:15 PM Medical Record Number: [id number] Patient Account Number: [id number] Date of Birth/Sex: 06-28-1976 (42 y.o. F) Treating RN: Montey Hora Primary Care Laneice Meneely: Priscille Kluver Other Clinician: Referring Kailee Essman: Priscille Kluver Treating Dwayna Kentner/Extender: STONE III, HOYT Weeks in Treatment: 5 Encounter Discharge Information Items Post Procedure Vitals Discharge Condition: Stable Temperature (F): 97.8 Ambulatory Status: Wheelchair Pulse (bpm): 98 Discharge Destination:  Home Respiratory Rate (breaths/min): 16 Transportation: Private Auto Blood Pressure (mmHg): 90/62 Accompanied By: mom Schedule Follow-up Appointment: Yes Clinical Summary of Care: Patient Declined Electronic Signature(s) Signed: 09/11/2018 4:52:40 PM By: Montey Hora Entered By: Montey Hora on 09/11/2018 15:07:16 Hartshorne, Cadie A. ([id number]) -------------------------------------------------------------------------------- Lower Extremity Assessment Details Patient Name: Heather Dear A. Date of Service: 09/11/2018 2:15 PM Medical Record Number: [id number] Patient Account Number: [id number] Date of Birth/Sex: 17-Jan-1976 (42 y.o. F) Treating RN: Cornell Barman Primary Care Nicolas Banh: Priscille Kluver Other Clinician: Referring Imaad Reuss: Priscille Kluver Treating Alyan Hartline/Extender: STONE III, HOYT Weeks in Treatment: 5 Edema Assessment Assessed: [Left: Yes] [Right: No] Edema: [Left: N] [Right: o] Vascular Assessment Pulses: Dorsalis Pedis Palpable: [Left:Yes] Posterior Tibial Extremity colors, hair growth, and conditions: Extremity Color: [Left:Pale] Hair Growth on Extremity: [Left:Yes] Temperature of Extremity: [Left:Warm] Capillary Refill: [Left:< 3 seconds] Toe Nail Assessment Left: Right: Thick: No Discolored: No Deformed: No Improper Length and Hygiene: No Electronic Signature(s) Signed: 09/12/2018 5:21:06 PM By: Gretta Cool, BSN, RN, CWS, [name] RN, BSN Entered By: Gretta Cool, BSN, RN, CWS, [name] on 09/11/2018 14:33:14 [name] ([id number]) -------------------------------------------------------------------------------- Multi Wound Chart Details Patient Name: Heather Dear A. Date of Service: 09/11/2018 2:15 PM Medical Record Number: [id number] Patient Account Number: [id number] Date of Birth/Sex: 11-16-1975 (42 y.o. 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] Comorbid History: [4R:Cataracts, Glaucoma, Optic Neuritis, Chronic sinus problems/congestion, Middle ear problems, Type II Diabetes] [N/A:N/A] Date Acquired: [4R:07/24/2018] [N/A:N/A] Weeks of Treatment: [4R:5] [N/A:N/A] Wound Status: [4R:Open] [N/A:N/A] Wound Recurrence: [4R:Yes] [N/A:N/A] Pending Amputation on [4R:Yes] [N/A:N/A] Presentation: Measurements L x W x D [4R:0.4x0.2x0.4] [N/A:N/A] (cm) Area (cm) : [4R:0.063] [N/A:N/A] Volume (  cm) : [4R:0.025] [N/A:N/A] % Reduction in Area: [4R:95.90%] [N/A:N/A] % Reduction in Volume: [4R:84.00%] [N/A:N/A] Starting Position 1 [4R:11] (o'clock): Ending Position 1 [4R:7] (o'clock): Maximum Distance 1 (cm): [4R:0.4] Undermining: [4R:Yes] [N/A:N/A] Classification: [4R:Grade 2] [N/A:N/A] Exudate Amount: [4R:Medium] [N/A:N/A] Exudate Type: [4R:Serous] [N/A:N/A] Exudate Color: [4R:amber] [N/A:N/A] Wound Margin: [4R:Thickened] [N/A:N/A] Granulation Amount: [4R:None Present (0%)] [N/A:N/A] Necrotic Amount: [4R:Large (67-100%)] [N/A:N/A] Necrotic Tissue: [4R:Eschar] [N/A:N/A] Exposed Structures: [4R:Fat Layer (Subcutaneous Tissue) Exposed: Yes] [N/A:N/A] Fascia: No Tendon: No Muscle: No Joint: No Bone: No Epithelialization: Large (67-100%) N/A N/A Periwound Skin Texture: Callus: Yes N/A N/A Excoriation: No Induration: No Crepitus: No Rash: No Scarring: No Periwound Skin Moisture: Maceration: No N/A N/A Dry/Scaly: No Periwound Skin Color: Atrophie Blanche: No N/A N/A Cyanosis: No Ecchymosis: No Erythema: No Hemosiderin  Staining: No Mottled: No Pallor: No Rubor: No Tenderness on Palpation: No N/A N/A Wound Preparation: Ulcer Cleansing: N/A N/A Rinsed/Irrigated with Saline Topical Anesthetic Applied: Other: lidocaine 4% Treatment Notes Electronic Signature(s) Signed: 09/11/2018 4:52:40 PM By: Montey Hora Entered By: Montey Hora on 09/11/2018 14:47:12 [name] ([id number]) -------------------------------------------------------------------------------- [place] Details Patient Name: Heather Dear A. Date of Service: 09/11/2018 2:15 PM Medical Record Number: [id number] Patient Account Number: [id number] Date of Birth/Sex: 28-Aug-1976 (42 y.o. F) Treating RN: Cornell Barman Primary Care Nicklous Aburto: Priscille Kluver Other Clinician: Referring Lavonn Maxcy: Priscille Kluver Treating Yarimar Lavis/Extender: Melburn Hake, HOYT Weeks in Treatment: 5 Active Inactive ` Abuse / Safety / Falls / Self Care Management Nursing Diagnoses: History of Falls Goals: Patient will not experience any injury related to falls Date Initiated: 08/07/2018 Target Resolution Date: 10/19/2018 Goal Status: Active Interventions: Assess fall risk on admission and as needed Notes: ` Orientation to the Wound Care Program Nursing Diagnoses: Knowledge deficit related to the wound healing center program Goals: Patient/caregiver will verbalize understanding of the [place] Date Initiated: 08/07/2018 Target Resolution Date: 10/19/2018 Goal Status: Active Interventions: Provide education on orientation to the wound center Notes: ` Soft Tissue Infection Nursing Diagnoses: Impaired tissue integrity Goals: Patient will remain free of wound infection Date Initiated: 08/07/2018 Target Resolution Date: 10/19/2018 Goal Status: Active Interventions: Kiernan, Thelia A. ([id number]) Assess signs and symptoms of infection every visit Notes: ` Wound/Skin Impairment Nursing Diagnoses: Impaired  tissue integrity Goals: Ulcer/skin breakdown will heal within 14 weeks Date Initiated: 08/07/2018 Target Resolution Date: 10/19/2018 Goal Status: Active Interventions: Assess patient/caregiver ability to obtain necessary supplies Notes: Electronic Signature(s) Signed: 09/11/2018 4:52:40 PM By: Montey Hora Signed: 09/12/2018 5:21:06 PM By: Gretta Cool, BSN, RN, CWS, [name] RN, BSN Entered By: Montey Hora on 09/11/2018 14:46:49 Dimitroff, Marieelena A. ([id number]) -------------------------------------------------------------------------------- Pain Assessment Details Patient Name: Heather Dear A. Date of Service: 09/11/2018 2:15 PM Medical Record Number: [id number] Patient Account Number: [id number] Date of Birth/Sex: Feb 24, 1976 (42 y.o. F) Treating RN: Cornell Barman Primary Care Param Capri: Priscille Kluver Other Clinician: Referring Mandel Seiden: Priscille Kluver Treating Katena Petitjean/Extender: Melburn Hake, HOYT Weeks in Treatment: 5 Active Problems Location of Pain Severity and Description of Pain Patient Has Paino No Site Locations Pain Management and Medication Current Pain Management: Electronic Signature(s) Signed: 09/12/2018 5:21:06 PM By: Gretta Cool, BSN, RN, CWS, [name] RN, BSN Entered By: Gretta Cool, BSN, RN, CWS, [name] on 09/11/2018 14:15:59 [name] ([id number]) -------------------------------------------------------------------------------- Patient/Caregiver Education Details Patient Name: Heather Dear A. Date of Service: 09/11/2018 2:15 PM Medical Record Number: [id number] Patient Account Number: [id number] Date of Birth/Gender: 14-Jun-1976 (42 y.o. F) Treating RN: Montey Hora Primary Care Physician: Priscille Kluver Other Clinician: Referring Physician: Priscille Kluver Treating Physician/Extender: Melburn Hake,  HOYT Weeks in Treatment: 5 Education Assessment Education Provided To: Patient Education Topics Provided Wound/Skin Impairment: Handouts: Caring for Your Ulcer, Other: Foot drop Methods:  Demonstration, Explain/Verbal Responses: State content correctly Electronic Signature(s) Signed: 09/11/2018 4:52:40 PM By: Montey Hora Entered By: Montey Hora on 09/11/2018 15:05:00 Fors, Suda A. ([id number]) -------------------------------------------------------------------------------- Wound Assessment Details Patient Name: Heather Dear A. Date of Service: 09/11/2018 2:15 PM Medical Record Number: [id number] Patient Account Number: [id number] Date of Birth/Sex: 1976/03/11 (42 y.o. F) Treating RN: Cornell Barman Primary Care Jesi Jurgens: Priscille Kluver Other Clinician: Referring Dacie Mandel: Priscille Kluver Treating Jacquis Paxton/Extender: STONE III, HOYT Weeks in Treatment: 5 Wound Status Wound Number: 4R Primary Diabetic Wound/Ulcer of the Lower Extremity Etiology: Wound Location: Left Toe Great Wound Open Wounding Event: Gradually Appeared Status: Date Acquired: 07/24/2018 Comorbid Cataracts, Glaucoma, Optic Neuritis, Chronic Weeks Of Treatment: 5 History: sinus problems/congestion, Middle ear problems, Clustered Wound: No Type II Diabetes Pending Amputation On Presentation Wound Measurements Length: (cm) 0.4 Width: (cm) 0.2 Depth: (cm) 0.4 Area: (cm) 0.063 Volume: (cm) 0.025 % Reduction in Area: 95.9% % Reduction in Volume: 84% Epithelialization: Large (67-100%) Undermining: Yes Starting Position (o'clock): 11 Ending Position (o'clock): 7 Maximum Distance: (cm) 0.4 Wound Description Classification: Grade 2 Wound Margin: Thickened Exudate Amount: Medium Exudate Type: Serous Exudate Color: amber Foul Odor After Cleansing: No Slough/Fibrino No Wound Bed Granulation Amount: None Present (0%) Exposed Structure Necrotic Amount: Large (67-100%) Fascia Exposed: No Necrotic Quality: Eschar Fat Layer (Subcutaneous Tissue) Exposed: Yes Tendon Exposed: No Muscle Exposed: No Joint Exposed: No Bone Exposed: No Periwound Skin Texture Texture Color No Abnormalities  Noted: No No Abnormalities Noted: No Callus: Yes Atrophie Blanche: No Crepitus: No Cyanosis: No Excoriation: No Ecchymosis: No Induration: No Erythema: No Rash: No Hemosiderin Staining: No Scarring: No Mottled: No Pallor: No Moisture Imhof, Halla A. ([id number]) No Abnormalities Noted: No Rubor: No Dry / Scaly: No Maceration: No Wound Preparation Ulcer Cleansing: Rinsed/Irrigated with Saline Topical Anesthetic Applied: Other: lidocaine 4%, Treatment Notes Wound #4R (Left Toe Great) Notes Silver collagen, dry gauze, conform. Rolled dry gauze under toe for off-loading Electronic Signature(s) Signed: 09/12/2018 5:21:06 PM By: Gretta Cool, BSN, RN, CWS, [name] RN, BSN Entered By: Gretta Cool, BSN, RN, CWS, [name] on 09/11/2018 14:32:18 [name] ([id number]) -------------------------------------------------------------------------------- Vitals Details Patient Name: Heather Dear A. Date of Service: 09/11/2018 2:15 PM Medical Record Number: [id number] Patient Account Number: [id number] Date of Birth/Sex: 1976-04-12 (42 y.o. F) Treating RN: Cornell Barman Primary Care Cameren Odwyer: Priscille Kluver Other Clinician: Referring Shineka Auble: Priscille Kluver Treating Jadin Creque/Extender: STONE III, HOYT Weeks in Treatment: 5 Vital Signs Time Taken: 14:16 Temperature (F): 97.8 Height (in): 69 Pulse (bpm): 98 Weight (lbs): 240 Respiratory Rate (breaths/min): 16 Body Mass Index (BMI): 35.4 Blood Pressure (mmHg): 90/62 Reference Range: 80 - 120 mg / dl Electronic Signature(s) Signed: 09/12/2018 5:21:06 PM By: Gretta Cool, BSN, RN, CWS, [name] RN, BSN Entered By: Gretta Cool, BSN, RN, CWS, [name] on 09/11/2018 14:23:29

## 2018-09-18 ENCOUNTER — Encounter: Payer: Medicaid Other | Admitting: Physician Assistant

## 2018-09-18 DIAGNOSIS — L97522 Non-pressure chronic ulcer of other part of left foot with fat layer exposed: Secondary | ICD-10-CM | POA: Diagnosis not present

## 2018-09-19 DIAGNOSIS — E282 Polycystic ovarian syndrome: Secondary | ICD-10-CM | POA: Insufficient documentation

## 2018-09-20 NOTE — Progress Notes (Signed)
[name], [name] ([id number]) Visit Report for 09/18/2018 Chief Complaint Document Details Patient Name: [name], [name] Date of Service: 09/18/2018 9:15 AM Medical Record Number: [id number] Patient Account Number: [id number] Date of Birth/Sex: 1976/06/27 (42 y.o. F) Treating RN: Montey Hora Primary Care Provider: Priscille Kluver Other Clinician: Referring Provider: Priscille Kluver Treating Provider/Extender: Melburn Hake, HOYT Weeks in Treatment: 6 Information Obtained from: Patient Chief Complaint She is here for evaluation of [name] left great toe ulcer Electronic Signature(s) Signed: 09/19/2018 12:54:11 AM By: Worthy Keeler PA-C Entered By: Worthy Keeler on 09/18/2018 09:40:42 [name] ([id number]) -------------------------------------------------------------------------------- Debridement Details Patient Name: [name] Date of Service: 09/18/2018 9:15 AM Medical Record Number: [id number] Patient Account Number: [id number] Date of Birth/Sex: 1976-02-17 (42 y.o. F) Treating RN: Montey Hora Primary Care Provider: Priscille Kluver Other Clinician: Referring Provider: Priscille Kluver Treating Provider/Extender: STONE III, HOYT Weeks in Treatment: 6 Debridement Performed for Wound #4R Left Toe Great Assessment: Performed By: Physician STONE III, HOYT E., PA-C Debridement Type: Debridement Severity of Tissue Pre Fat layer exposed Debridement: Level of Consciousness (Pre- Awake and Alert procedure): Pre-procedure Verification/Time Yes - 09:46 Out Taken: Start Time: 09:46 Pain Control: Lidocaine 4% Topical Solution Total Area Debrided (L x W): 0.5 (cm) x 0.2 (cm) = 0.1 (cm) Tissue and other material Viable, Non-Viable, Callus, Slough, Subcutaneous, Slough debrided: Level: Skin/Subcutaneous Tissue Debridement Description: Excisional Instrument: Curette Bleeding: Minimum Hemostasis Achieved: Pressure End Time: 09:47 Procedural Pain: 0 Post Procedural Pain:  0 Response to Treatment: Procedure was tolerated well Level of Consciousness Awake and Alert (Post-procedure): Post Debridement Measurements of Total Wound Length: (cm) 0.6 Width: (cm) 0.3 Depth: (cm) 0.3 Volume: (cm) 0.042 Character of Wound/Ulcer Post Debridement: Improved Severity of Tissue Post Debridement: Fat layer exposed Post Procedure Diagnosis Same as Pre-procedure Electronic Signature(s) Signed: 09/18/2018 5:02:25 PM By: Montey Hora Signed: 09/19/2018 12:54:11 AM By: Worthy Keeler PA-C Entered By: Montey Hora on 09/18/2018 09:48:10 [name] ([id number]) -------------------------------------------------------------------------------- HPI Details Patient Name: [name] Date of Service: 09/18/2018 9:15 AM Medical Record Number: [id number] Patient Account Number: [id number] Date of Birth/Sex: [birth year] (42 y.o. F) Treating RN: Montey Hora Primary Care Provider: Priscille Kluver Other Clinician: Referring Provider: Priscille Kluver Treating Provider/Extender: Melburn Hake, HOYT Weeks in Treatment: 6 History of Present Illness HPI Description: 01/18/18-She is here for initial evaluation of the left great toe ulcer. She is [name] poor historian in regards to timeframe in detail. She states approximately 4 weeks ago she lacerated her toe on something in the house. She followed up with her primary care who placed her on Bactrim and ultimately [name] second dose of Bactrim prior to coming to wound clinic. She states she has been treating the toe with peroxide, Betadine and [name] Band-Aid. She did not check her blood sugar this morning but checked it yesterday morning it was 327; she is unaware of [name] recent A1c and there are no current records. She saw Dr. she would've orthopedics last week for an old injury to the left ankle, she states he did not see her toe, nor did she bring it to his attention. She smokes approximately 1 pack cigarettes [name] day. Her social situation is  concerning, she arrives this morning with her mother who appears extremely intoxicated/under the influence; her mother was asked to leave the room and be monitored by the patient's grandmother. The patient's aunt then accompanied the patient and the room throughout the rest of the appointment. We had [name] lengthy discussion regarding the  deleterious effects of uncontrolled hyperglycemia and smoking as it relates to wound healing and overall health. She was strongly encouraged to decrease her smoking and get her diabetes under better control. She states she is currently on [name] diet and has cut down her [facility] consumption. The left toe is erythematous, macerated and slightly edematous with malodor present. The edema in her left foot is below her baseline, there is no erythema streaking. We will treat her with Santyl, doxycycline; we have ordered and xray, culture and provided [name] Peg assist surgical shoe and cultured the wound. 01/25/18-She is here in follow-up evaluation for [name] left great toe ulcer and presents with an abscess to her suprapubic area. She states her blood sugars remain elevated, feeling "sick" and if levels are below 250, but she is trying. She has made no attempt to decrease her smoking stating that we "can't take away her food in her cigarettes". She has been compliant with offloading using the PEG assist you. She is using Santyl daily. the culture obtained last week grew staph aureus and Enterococcus faecalis; continues on the doxycycline and Augmentin was added on Monday. The suprapubic area has erythema, no femoral variation, purple discoloration, minimal induration, was accessed with [name] cotton tip applicator with sanguinopurulent drainage, this was cultured, I suspect the current antibiotic treatment will cover and we will not add anything to her current treatment plan. She was advised to go to urgent care or ER with any change in redness, induration or fever. 02/01/18-She is here in  follow-up evaluation for left great toe ulcers and [name] new abdominal abscess from last week. She was able to use packing until earlier this week, where she "forgot it was there". She states she was feeling ill with GI symptoms last week and was not taking her antibiotic. She states her glucose levels have been predominantly less than 200, with occasional levels between 200-250. She thinks this was contributing to her GI symptoms as they have resolved without intervention. There continues to be significant laceration to left toe, otherwise it clinically looks stable/improved. There is now less superficial opening to the lateral aspect of the great toe that was residual blister. We will transition to [facility] to all wounds, she will continue her Augmentin. If there is no change or deterioration next week for reculture. 02/08/18-She is here in follow-up evaluation for left great toe ulcer and abdominal ulcer. There is an improvement in both wounds. She has been wrapping her left toe with coban, not by our direction, which has created an area of discoloration to the medial aspect; she has been advised to NOT use coban secondary to her neuropathy. She states her glucose levels have been high over this last week ranging from 200-350, she continues to smoke. She admits to being less compliant with her offloading shoe. We will continue with same treatment plan and she will follow-up next week. 02/15/18-She is here in follow-up evaluation for left great toe ulcer and abdominal ulcer. The abdominal ulcer is epithelialized. The left great toe ulcer is improved and all injury from last week using the Coban wrap is resolved, the lateral ulcer is healed. She admits to noncompliance with wearing offloading shoe and admits to glucose levels being greater than 300 most of the week. She continues to smoke and expresses no desire to quit. There is one area medially that probes deeper than it has historically,  erythema to the toe and dorsal foot has consistently waxed and waned. There is no overt  signs of cellulitis or infection but we will culture the wound for any occult infection given the new area of depth and erythema. We will hold off on sensitivities for initiation of antibiotic therapy. 02/22/18-She is here in follow up evaluation for left great toe ulcer. There is overall significant improvement in both wound appearance, erythema and edema with changes made last week. She was not initiated on antibiotic therapy. Culture obtained last week showed oxacillin sensitive staph aureus, sensitive to clindamycin. Clindamycin has been called into the pharmacy but she has been instructed to hold off on initiation secondary to overall clinical improvement and her history of antibiotic intolerance. She has been instructed to contact the clinic with any noted changes/deterioration and the wound, erythema, Onnen, Rayn [name]. ([id number]) edema and/or pain. She will follow-up next week. She continues to smoke and her glucose levels remain elevated >250; she admits to compliance with offloading shoe 03/01/18 on evaluation today patient appears to be doing fairly well in regard to her left first toe ulcer. She has been tolerating the dressing changes with the [facility] Dressing without complication and overall this has definitely showed signs of improvement according to records as well is what the patient tells me today. I'm very pleased in that regard. She is having no pain today 03/08/18 She is here for follow up evaluation of [name] left great toe ulcer. She remains non-compliant with glucose control and smoking cessation; glucose levels consistently >200. She states that she got new shoe inserts/peg assist. She admits to compliance with offloading. Since my last evaluation there is significant improvement. We will switch to prisma at this time and she will follow up next week. She is noted to be tachycardic at this  appointment, heart rate 120s; she has [name] history of heart rate 70-130 according to our records. She admits to extreme agitation r/t personal issues; she was advised to monitor her heartrate and contact her physician if it does not return to [name] more normal range (<100). She takes cardizem twice daily. 03/15/18-She is here in follow-up evaluation for left great toe ulcer. She remains noncompliant with glucose control and smoking cessation. She admits to compliance with wearing offloading shoe. The ulcer is improved/stable and we will continue with the same treatment plan and she will follow-up next week 03/22/18-She is here for evaluation for left great toe ulcer. There continues to be significant improvement despite recurrent hyperglycemia (over 500 yesterday) and she continues to smoke. She has been compliant with offloading and we will continue with same treatment plan and she will follow-up next week. 03/29/18-She is here for evaluation for left great toe ulcer. Despite continuing to smoke and uncontrolled diabetes she continues to improve. She is compliant with offloading shoe. We will continue with the same treatment plan and she will follow-up next week 04/05/18- She is here in follow up evaluation for [name] left great toe ulcer; she presents with small pustule to left fifth toe (resembles ant bite). She admits to compliance with wearing offloading shoe; continues to smoke or have uncontrolled blood glucose control. There is more callus than usual with evidence of bleeding; she denies known trauma. 04/12/18-She is here for evaluation of left great toe ulcer. Despite noncompliance with glycemic control and smoking she continues to make improvement. She continues to wear offloading shoe. The pustule, that was identified last week, to the left fifth toe is resolved. She will follow-up in 2 weeks 05/03/18-she is seen in follow-up evaluation for [name] left great toe ulcer. She  is compliant with offloading,  otherwise noncompliant with glycemic control and smoking. She has plateaued and there is minimal improvement noted. We will transition to [facility], replaced the insert to her surgical shoe and she will follow-up in one week 05/10/18- She is here in follow up evaluation for [name] left great toe ulcer. It appears stable despite measurement change. We will continue with same treatment plan and follow up next week. 05/24/18-She is seen in follow-up evaluation for [name] left great toe ulcer. She remains compliant with offloading, has made significant improvement in her diet, decreasing the amount of sugar/soda. She said her recent A1c was 10.9 which is lower than. She did see [name] diabetic nutritionist/educator yesterday. She continues to smoke. We will continue with the same treatment plan and she'll follow-up next week. 05/31/18- She is seen in follow-up evaluation for left great toe ulcer. She continues to remain compliant with offloading, continues to make improvement in her diet, increasing her water and decreasing the amount of sugar/soda. She does continue to smoke with no desire to quit. We will apply Prisma to the depth and Hydrofera Blue over. We have not received insurance authorization for oasis. She will follow up next week. 06/07/18-She is seen in follow-up evaluation for left great toe ulcer. It has stalled according to today's measurements although base appears stable. She says she saw [name] diabetic educator yesterday; her average blood sugars are less than 300 which is an improvement for her. She continues to smoke and states "that's my next step" She continues with water over soda. We will order for xray, culture and reinstate ace wrap compression prior to placing apligraf for next week. She is voicing no complaints or concerns. Her dressing will change to iodoflex over the next week in preparation for apligraf. 06/14/18-She is seen in follow-up evaluation for left great toe ulcer. Plain film x-ray  performed last week was negative for osteomyelitis. Wound culture obtained last week grew strep B and OSSA; she is initiated on keflex and cefdinir today; there is erythema to the toe which could be from ace wrap compression, she has [name] history of wrapping too tight and has has been encouraged to maintain ace wraps that we place today. We will hold off on application of apligraf today, will apply next week after antibiotic therapy has been initiated. She admits today that she has resumed taking [name] shower with her foot/toe submerged in water, she has been reminded to keep foot/toe out of the bath water. She will be seen in follow up next week 06/21/18-she is seen in follow-up evaluation for left great toe ulcer. She is tolerating antibiotic therapy with no GI disturbance. The wound is stable. Apligraf was applied today. She has been decreasing her smoking, only had 4 cigarettes yesterday and 1 today. She continues being more compliant in diabetic diet. She will follow-up next week for evaluation of site, if stable will remove at 2 weeks. 06/28/18- She is here in follow up evalution. Apligraf was placed last week, she states the dressing fell off on Tuesday and she was dressing with hydrofera blue. She is healed and will be discharged from the clinic today. She has been instructed to continue with smoking cessation, continue monitoring glucose levels, offloading for an additional 4 weeks and continue with Murillo, Shellie [name]. ([id number]) hydrofera blue for additional two weeks for any possible microscopic opening. Readmission: 08/07/18 on evaluation today patient presents for reevaluation concerning the ulcer of her right great toe. She was previously discharged on  06/28/18 healed. Nonetheless she states that this began to show signs of drainage she subsequently went to her primary care provider. Subsequently an x-ray was performed on 08/01/18 which was negative. The patient was also placed on antibiotics at  that time. Fortunately they should have been effective for the infection. Nonetheless she's been experiencing some improvement but still has [name] lot of drainage coming from the wound itself. 08/14/18 on evaluation today patient's wound actually does show signs of improvement in regard to the erythema at this point. She has completed the antibiotics. With that being said we did discuss the possibility of placing her in [name] total contact cast as of today although I think that I may want to give this just [name] little bit more time to ensure nothing recurrence as far as her infection is concerned. I do not want to put in the cast and risk infection at that time if things are not completely resolved. With that being said she is gonna require some debridement today. 08/21/18 on evaluation today patient actually appears to be doing okay in regard to her toe ulcer. She's been tolerating the dressing changes without complication. With that being said it does appear that she is ready and in fact I think it's appropriate for [place] to go ahead and initiate the total contact cast today. Nonetheless she will require some sharp debridement to prepare the wound for application. Overall I feel like things have been progressing well but we do need to do something to get this to close more readily. 08/24/18 patient seen today for reevaluation after having had the total contact cast applied on Tuesday. She seems to have done very well the wound appears to be doing great and overall I'm pleased with the progress that she's made. There were no abnormal areas of rubbing from the cast on her lower extremity. 08/30/18 on evaluation today patient actually appears to be completely healed in regard to her plantar toe ulcer. She tells me at this point she's been having [name] lot of issues with the cast. She almost fell [name] couple of times the state shall the step of her dog [name] couple times as well. This is been [name] very frustrating process for her  other nonetheless she has completely healed the wound which is excellent news. Overall there does not appear to be the evidence of infection at this time which is great news. 09/11/18 evaluation today patient presents for follow-up concerning her great toe ulcer on the left which has unfortunately reopened since I last saw her which was only [name] couple of weeks ago. Unfortunately she was not able to get in to get the shoe and potentially the AFO that's gonna be necessary due to her left foot drop. She continues with offloading shoe but this is not enough to prevent her from reopening it appears. When we last had her in the total contact cast she did well from [name] healing standpoint but unfortunately the wound reopened as soon as she came out of the cast within just [name] couple of weeks. 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 This is also part of the reason why she falls on [name] regular basis. I just do not believe that is gonna be safe for [place] to reinitiate the total  contact cast as last time we had this on she fell 3 times one week which is definitely not normal for her. 09/18/18 upon evaluation today the patient actually appears to be doing about the same in regard to her toe ulcer. She did not contact [employer] as I asked her to even though I had given her the prescription. In fact she actually states that she has no idea where the prescription is. She did apparently call [employer] and they told her that all she needed to do was bring the prescription in order to be able to be seen and work on getting the AFO for her left foot. With all that being said she still does not have an appointment and I'm not sure were things stand that regard. I will give her [name] new prescription today in order to contact them to get  this set up. Electronic Signature(s) Signed: 09/19/2018 12:54:11 AM By: Worthy Keeler PA-C Entered By: Worthy Keeler on 09/18/2018 11:15:18 [name] ([id number]) -------------------------------------------------------------------------------- Physical Exam Details Patient Name: [name] Date of Service: 09/18/2018 9:15 AM Medical Record Number: [id number] Patient Account Number: [id number] Date of Birth/Sex: 05-16-1976 (42 y.o. F) Treating RN: Montey Hora Primary Care Provider: Priscille Kluver Other Clinician: Referring Provider: Priscille Kluver Treating Provider/Extender: STONE III, HOYT Weeks in Treatment: 6 Constitutional Well-nourished and well-hydrated in no acute distress. Respiratory normal breathing without difficulty. clear to auscultation bilaterally. Psychiatric this patient is able to make decisions and demonstrates good insight into disease process. Alert and Oriented x 3. pleasant and cooperative. Notes On inspection today patient's wound bed did show some callous buildup as well as slough in the service of the wound which required sharp debridement today. I actually did perform this without complication she had no significant pain post debridement the wound bed appears to be doing much better which is great news. Electronic Signature(s) Signed: 09/19/2018 12:54:11 AM By: Worthy Keeler PA-C Entered By: Worthy Keeler on 09/18/2018 11:15:49 [name] ([id number]) -------------------------------------------------------------------------------- Physician Orders Details Patient Name: [name] Date of Service: 09/18/2018 9:15 AM Medical Record Number: [id number] Patient Account Number: [id number] Date of Birth/Sex: [birth year] (42 y.o. 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 Primary Wound Dressing Wound #4R Left Toe Great o Silver Collagen Secondary Dressing Wound #4R Left Toe Great o Dry Gauze o Conform/Kerlix Dressing Change Frequency Wound #4R Left Toe Great o Change dressing every other day. Follow-up Appointments Wound #4R Left Toe Great o Return Appointment in 1 week. Off-Loading o Other: - Referral to [employer] and [employer] for AFO Electronic Signature(s) Signed: 09/18/2018 5:02:25 PM By: Montey Hora Signed: 09/19/2018 12:54:11 AM By: Worthy Keeler PA-C Entered By: Montey Hora on 09/18/2018 09:50:20 Voght, Alishia [name]. ([id number]) -------------------------------------------------------------------------------- Problem List Details Patient Name: [name] Date of Service: 09/18/2018 9:15 AM Medical Record Number: [id number] Patient Account Number: [id number] Date of Birth/Sex: October 23, 1976 (42 y.o. F) Treating RN:  Montey Hora Primary Care Provider: Priscille Kluver Other Clinician: Referring Provider: Priscille Kluver Treating Provider/Extender: Melburn Hake, HOYT Weeks in Treatment: 6 Active Problems ICD-10 Evaluated Encounter Code Description Active Date Today Diagnosis E11.621 Type 2 diabetes mellitus with foot ulcer 08/07/2018 No Yes L97.522 Non-pressure chronic ulcer of other part of left foot with fat 08/08/2018 No Yes layer exposed M21.372 Foot drop, left foot 09/11/2018 No Yes Inactive Problems Resolved Problems ICD-10 Code Description Active Date Resolved  Date L03.032 Cellulitis of left toe 08/07/2018 08/07/2018 Electronic Signature(s) Signed: 09/19/2018 12:54:11 AM By: Worthy Keeler PA-C Entered By: Worthy Keeler on 09/18/2018 09:40:37 Diekmann, Lexiana [name]. ([id number]) -------------------------------------------------------------------------------- Progress Note Details Patient Name: [name] Date of Service: 09/18/2018 9:15 AM Medical Record Number: [id number] Patient Account Number: [id number] Date of Birth/Sex: [birth year] (42 y.o. F) Treating RN: Montey Hora Primary Care Provider: Priscille Kluver Other Clinician: Referring Provider: Priscille Kluver Treating Provider/Extender: Melburn Hake, HOYT Weeks in Treatment: 6 Subjective Chief Complaint Information obtained from Patient She is here for evaluation of [name] left great toe ulcer History of Present Illness (HPI) 01/18/18-She is here for initial evaluation of the left great toe ulcer. She is [name] poor historian in regards to timeframe in detail. She states approximately 4 weeks ago she lacerated her toe on something in the house. She followed up with her primary care who placed her on Bactrim and ultimately [name] second dose of Bactrim prior to coming to wound clinic. She states she has been treating the toe with peroxide, Betadine and [name] Band-Aid. She did not check her blood sugar this morning but checked it yesterday morning it was 327; she is unaware of [name] recent A1c and there are no current records. She saw Dr. she would've orthopedics last week for an old injury to the left ankle, she states he did not see her toe, nor did she bring it to his attention. She smokes approximately 1 pack cigarettes [name] day. Her social situation is concerning, she arrives this morning with her mother who appears extremely intoxicated/under the influence; her mother was asked to leave the room and be monitored by the patient's grandmother. The patient's aunt then accompanied the patient and the room throughout the  rest of the appointment. We had [name] lengthy discussion regarding the deleterious effects of uncontrolled hyperglycemia and smoking as it relates to wound healing and overall health. She was strongly encouraged to decrease her smoking and get her diabetes under better control. She states she is currently on [name] diet and has cut down her [facility] consumption. The left toe is erythematous, macerated and slightly edematous with malodor present. The edema in her left foot is below her baseline, there is no erythema streaking. We will treat her with Santyl, doxycycline; we have ordered and xray, culture and provided [name] Peg assist surgical shoe and cultured the wound. 01/25/18-She is here in follow-up evaluation for [name] left great toe ulcer and presents with an abscess to her suprapubic area. She states her blood sugars remain elevated, feeling "sick" and if levels are below 250, but she is trying. She has made no attempt to decrease her smoking stating that we "can't take away her food in her cigarettes". She has been compliant with offloading using the PEG assist you. She is using Santyl daily. the culture obtained last week grew staph aureus and Enterococcus faecalis; continues on the doxycycline and Augmentin was added on Monday. The suprapubic area has erythema, no femoral variation, purple discoloration, minimal induration, was  accessed with [name] cotton tip applicator with sanguinopurulent drainage, this was cultured, I suspect the current antibiotic treatment will cover and we will not add anything to her current treatment plan. She was advised to go to urgent care or ER with any change in redness, induration or fever. 02/01/18-She is here in follow-up evaluation for left great toe ulcers and [name] new abdominal abscess from last week. She was able to use packing until earlier this week, where she "forgot it was there". She states she was feeling ill with GI symptoms last week and was not taking her antibiotic.  She states her glucose levels have been predominantly less than 200, with occasional levels between 200-250. She thinks this was contributing to her GI symptoms as they have resolved without intervention. There continues to be significant laceration to left toe, otherwise it clinically looks stable/improved. There is now less superficial opening to the lateral aspect of the great toe that was residual blister. We will transition to [facility] to all wounds, she will continue her Augmentin. If there is no change or deterioration next week for reculture. 02/08/18-She is here in follow-up evaluation for left great toe ulcer and abdominal ulcer. There is an improvement in both wounds. She has been wrapping her left toe with coban, not by our direction, which has created an area of discoloration to the medial aspect; she has been advised to NOT use coban secondary to her neuropathy. She states her glucose levels have been high over this last week ranging from 200-350, she continues to smoke. She admits to being less compliant with her offloading shoe. We will continue with same treatment plan and she will follow-up next week. 02/15/18-She is here in follow-up evaluation for left great toe ulcer and abdominal ulcer. The abdominal ulcer is epithelialized. The left great toe ulcer is improved and all injury from last week using the Coban wrap is resolved, the lateral ulcer is healed. She admits to noncompliance with wearing offloading shoe and admits to glucose levels being greater than 300 most of the week. She continues to smoke and expresses no desire to quit. There is one area medially that probes deeper than it has historically, erythema to the toe and dorsal foot has consistently waxed and waned. There is no overt signs of cellulitis or infection but we will culture the wound for any occult infection given the new area of depth and erythema. We will hold off on Balch, Lurlean [name].  ([id number]) sensitivities for initiation of antibiotic therapy. 02/22/18-She is here in follow up evaluation for left great toe ulcer. There is overall significant improvement in both wound appearance, erythema and edema with changes made last week. She was not initiated on antibiotic therapy. Culture obtained last week showed oxacillin sensitive staph aureus, sensitive to clindamycin. Clindamycin has been called into the pharmacy but she has been instructed to hold off on initiation secondary to overall clinical improvement and her history of antibiotic intolerance. She has been instructed to contact the clinic with any noted changes/deterioration and the wound, erythema, edema and/or pain. She will follow-up next week. She continues to smoke and her glucose levels remain elevated >250; she admits to compliance with offloading shoe 03/01/18 on evaluation today patient appears to be doing fairly well in regard to her left first toe ulcer. She has been tolerating the dressing changes with the [facility] Dressing without complication and overall this has definitely showed signs of improvement according to records as well is what the patient tells  me today. I'm very pleased in that regard. She is having no pain today 03/08/18 She is here for follow up evaluation of [name] left great toe ulcer. She remains non-compliant with glucose control and smoking cessation; glucose levels consistently >200. She states that she got new shoe inserts/peg assist. She admits to compliance with offloading. Since my last evaluation there is significant improvement. We will switch to prisma at this time and she will follow up next week. She is noted to be tachycardic at this appointment, heart rate 120s; she has [name] history of heart rate 70-130 according to our records. She admits to extreme agitation r/t personal issues; she was advised to monitor her heartrate and contact her physician if it does not return to [name] more normal  range (<100). She takes cardizem twice daily. 03/15/18-She is here in follow-up evaluation for left great toe ulcer. She remains noncompliant with glucose control and smoking cessation. She admits to compliance with wearing offloading shoe. The ulcer is improved/stable and we will continue with the same treatment plan and she will follow-up next week 03/22/18-She is here for evaluation for left great toe ulcer. There continues to be significant improvement despite recurrent hyperglycemia (over 500 yesterday) and she continues to smoke. She has been compliant with offloading and we will continue with same treatment plan and she will follow-up next week. 03/29/18-She is here for evaluation for left great toe ulcer. Despite continuing to smoke and uncontrolled diabetes she continues to improve. She is compliant with offloading shoe. We will continue with the same treatment plan and she will follow-up next week 04/05/18- She is here in follow up evaluation for [name] left great toe ulcer; she presents with small pustule to left fifth toe (resembles ant bite). She admits to compliance with wearing offloading shoe; continues to smoke or have uncontrolled blood glucose control. There is more callus than usual with evidence of bleeding; she denies known trauma. 04/12/18-She is here for evaluation of left great toe ulcer. Despite noncompliance with glycemic control and smoking she continues to make improvement. She continues to wear offloading shoe. The pustule, that was identified last week, to the left fifth toe is resolved. She will follow-up in 2 weeks 05/03/18-she is seen in follow-up evaluation for [name] left great toe ulcer. She is compliant with offloading, otherwise noncompliant with glycemic control and smoking. She has plateaued and there is minimal improvement noted. We will transition to [facility], replaced the insert to her surgical shoe and she will follow-up in one week 05/10/18- She is here in follow  up evaluation for [name] left great toe ulcer. It appears stable despite measurement change. We will continue with same treatment plan and follow up next week. 05/24/18-She is seen in follow-up evaluation for [name] left great toe ulcer. She remains compliant with offloading, has made significant improvement in her diet, decreasing the amount of sugar/soda. She said her recent A1c was 10.9 which is lower than. She did see [name] diabetic nutritionist/educator yesterday. She continues to smoke. We will continue with the same treatment plan and she'll follow-up next week. 05/31/18- She is seen in follow-up evaluation for left great toe ulcer. She continues to remain compliant with offloading, continues to make improvement in her diet, increasing her water and decreasing the amount of sugar/soda. She does continue to smoke with no desire to quit. We will apply Prisma to the depth and Hydrofera Blue over. We have not received insurance authorization for oasis. She will follow up next week. 06/07/18-She  is seen in follow-up evaluation for left great toe ulcer. It has stalled according to today's measurements although base appears stable. She says she saw [name] diabetic educator yesterday; her average blood sugars are less than 300 which is an improvement for her. She continues to smoke and states "that's my next step" She continues with water over soda. We will order for xray, culture and reinstate ace wrap compression prior to placing apligraf for next week. She is voicing no complaints or concerns. Her dressing will change to iodoflex over the next week in preparation for apligraf. 06/14/18-She is seen in follow-up evaluation for left great toe ulcer. Plain film x-ray performed last week was negative for osteomyelitis. Wound culture obtained last week grew strep B and OSSA; she is initiated on keflex and cefdinir today; there is erythema to the toe which could be from ace wrap compression, she has [name] history of wrapping too  tight and has has been encouraged to maintain ace wraps that we place today. We will hold off on application of apligraf today, will apply next week after antibiotic therapy has been initiated. She admits today that she has resumed taking [name] shower with her foot/toe submerged in water, she has been reminded to keep foot/toe out of the bath water. She will be seen in follow up next week 06/21/18-she is seen in follow-up evaluation for left great toe ulcer. She is tolerating antibiotic therapy with no GI disturbance. The wound is stable. Apligraf was applied today. She has been decreasing her smoking, only had 4 cigarettes yesterday and Tawney, Myrla [name]. ([id number]) 1 today. She continues being more compliant in diabetic diet. She will follow-up next week for evaluation of site, if stable will remove at 2 weeks. 06/28/18- She is here in follow up evalution. Apligraf was placed last week, she states the dressing fell off on Tuesday and she was dressing with hydrofera blue. She is healed and will be discharged from the clinic today. She has been instructed to continue with smoking cessation, continue monitoring glucose levels, offloading for an additional 4 weeks and continue with hydrofera blue for additional two weeks for any possible microscopic opening. Readmission: 08/07/18 on evaluation today patient presents for reevaluation concerning the ulcer of her right great toe. She was previously discharged on 06/28/18 healed. Nonetheless she states that this began to show signs of drainage she subsequently went to her primary care provider. Subsequently an x-ray was performed on 08/01/18 which was negative. The patient was also placed on antibiotics at that time. Fortunately they should have been effective for the infection. Nonetheless she's been experiencing some improvement but still has [name] lot of drainage coming from the wound itself. 08/14/18 on evaluation today patient's wound actually does show signs of  improvement in regard to the erythema at this point. She has completed the antibiotics. With that being said we did discuss the possibility of placing her in [name] total contact cast as of today although I think that I may want to give this just [name] little bit more time to ensure nothing recurrence as far as her infection is concerned. I do not want to put in the cast and risk infection at that time if things are not completely resolved. With that being said she is gonna require some debridement today. 08/21/18 on evaluation today patient actually appears to be doing okay in regard to her toe ulcer. She's been tolerating the dressing changes without complication. With that being said it does appear that she is  ready and in fact I think it's appropriate for [place] to go ahead and initiate the total contact cast today. Nonetheless she will require some sharp debridement to prepare the wound for application. Overall I feel like things have been progressing well but we do need to do something to get this to close more readily. 08/24/18 patient seen today for reevaluation after having had the total contact cast applied on Tuesday. She seems to have done very well the wound appears to be doing great and overall I'm pleased with the progress that she's made. There were no abnormal areas of rubbing from the cast on her lower extremity. 08/30/18 on evaluation today patient actually appears to be completely healed in regard to her plantar toe ulcer. She tells me at this point she's been having [name] lot of issues with the cast. She almost fell [name] couple of times the state shall the step of her dog [name] couple times as well. This is been [name] very frustrating process for her other nonetheless she has completely healed the wound which is excellent news. Overall there does not appear to be the evidence of infection at this time which is great news. 09/11/18 evaluation today patient presents for follow-up concerning her great toe ulcer  on the left which has unfortunately reopened since I last saw her which was only [name] couple of weeks ago. Unfortunately she was not able to get in to get the shoe and potentially the AFO that's gonna be necessary due to her left foot drop. She continues with offloading shoe but this is not enough to prevent her from reopening it appears. When we last had her in the total contact cast she did well from [name] healing standpoint but unfortunately the wound reopened as soon as she came out of the cast within just [name] couple of weeks. 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 This is also part of the reason why she falls on [name] regular basis. I just do not believe that is gonna be safe for [place] to reinitiate the total contact cast as last time we had this on she fell 3 times one week which is definitely not normal for her. 09/18/18 upon evaluation today the patient actually appears to be doing about the same in regard to her toe ulcer. She did not contact [facility] as I asked her to even though I had given her the prescription. In fact she actually states that she has no idea where the prescription is. She did apparently call [facility] and they told her that all she needed to do was bring the prescription in order to be able to be seen and work on getting the AFO for her left foot. With all that being said she still does not have an appointment and I'm not sure were things stand that regard. I will give her [name] new prescription today in order to contact them to get this set up. Patient History Information obtained from Patient. Social History Current every day smoker. AKIKO, SCHEXNIDER [name]. ([id number]) Review of Systems (ROS) Constitutional Symptoms (General Health) Denies complaints or symptoms of Fever,  Chills. Respiratory The patient has no complaints or symptoms. Cardiovascular The patient has no complaints or symptoms. Psychiatric The patient has no complaints or symptoms. Objective Constitutional  Well-nourished and well-hydrated in no acute distress. Vitals Time Taken: 9:22 AM, Height: 69 in, Weight: 240 lbs, BMI: 35.4, Temperature: 98.1 F, Pulse: 93 bpm, Respiratory Rate: 18 breaths/min, Blood Pressure: 102/70 mmHg. Respiratory normal breathing without difficulty. clear to auscultation bilaterally. Psychiatric this patient is able to make decisions and demonstrates good insight into disease process. Alert and Oriented x 3. pleasant and cooperative. General Notes: On inspection today patient's wound bed did show some callous buildup as well as slough in the service of the wound which required sharp debridement today. I actually did perform this without complication she had no significant pain post debridement the wound bed appears to be doing much better which is great news. Integumentary (Hair, Skin) Wound #4R status is Open. Original cause of wound was Gradually Appeared. The wound is located on the Left Toe Great. The wound measures 0.5cm length x 0.2cm width x 0.4cm depth; 0.079cm^2 area and 0.031cm^3 volume. There is Fat Layer (Subcutaneous Tissue) Exposed exposed. There is [name] small amount of serous drainage noted. The wound margin is thickened. There is no granulation within the wound bed. There is [name] large (67-100%) amount of necrotic tissue within the wound bed including Eschar. The periwound skin appearance exhibited: Callus. The periwound skin appearance did not exhibit: Crepitus, Excoriation, Induration, Rash, Scarring, Dry/Scaly, Maceration, Atrophie Blanche, Cyanosis, Ecchymosis, Hemosiderin Staining, Mottled, Pallor, Rubor, Erythema. Assessment Active Problems ICD-10 Type 2 diabetes mellitus with foot ulcer Ewton, Claudeen [name]. 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 There was [name] Excisional Skin/Subcutaneous Tissue Debridement with [name] total area of 0.1 sq cm performed by STONE III, HOYT E., PA-C. With the following instrument(s): Curette to remove Viable and Non-Viable tissue/material. Material removed includes Callus, Subcutaneous Tissue, and Slough after achieving pain control using Lidocaine 4% Topical Solution. No specimens were taken. [name] time out was conducted at 09:46, prior to the start of the procedure. [name] Minimum amount of bleeding was controlled with Pressure. The procedure was tolerated well with [name] pain level of 0 throughout and [name] pain level of 0 following the procedure. Post Debridement Measurements: 0.6cm length x 0.3cm width x 0.3cm depth; 0.042cm^3 volume. Character of Wound/Ulcer Post Debridement is improved. Severity of Tissue Post Debridement is: Fat layer exposed. Post procedure Diagnosis Wound #4R: Same as Pre-Procedure Plan Wound Cleansing: Wound #4R Left Toe Great: Cleanse wound with mild soap and water Anesthetic (add to Medication List): Wound #4R Left Toe Great: Topical Lidocaine 4% cream applied to wound bed prior to debridement (In Clinic Only). Primary Wound Dressing: Wound #4R Left Toe Great: Silver Collagen Secondary Dressing: Wound #4R Left Toe Great: Dry Gauze Conform/Kerlix Dressing Change Frequency: Wound #4R Left Toe Great: Change dressing every other day. Follow-up Appointments: Wound #4R Left Toe Great: Return Appointment in 1 week. Off-Loading: Other: - Referral to [facility] for AFO I'm gonna suggest that we continue with the Current wound care measures for the next week. The patient is in agreement with plan. I did give her [name] another  prescription for [facility] for the AFO. We will subsequently see were things stand at follow- up. Truax, Marialuisa [name]. ([id number]) Please see above for specific wound care orders. We will see patient for re-evaluation in 1 week(s) here in the clinic.  If anything worsens or changes patient will contact our office for additional recommendations. Electronic Signature(s) Signed: 09/19/2018 12:54:11 AM By: Worthy Keeler PA-C Entered By: Worthy Keeler on 09/18/2018 11:16:44 [name] ([id number]) -------------------------------------------------------------------------------- ROS/PFSH Details Patient Name: [name] Date of Service: 09/18/2018 9:15 AM Medical Record Number: [id number] Patient Account Number: [id number] Date of Birth/Sex: [birth year] (42 y.o. F) Treating RN: Montey Hora Primary Care Provider: Priscille Kluver Other Clinician: Referring Provider: Priscille Kluver Treating Provider/Extender: STONE III, HOYT Weeks in Treatment: 6 Information Obtained From Patient Wound History Constitutional Symptoms (General Health) Complaints and Symptoms: Negative for: Fever; Chills Eyes Medical History: Positive for: Cataracts; Glaucoma; Optic Neuritis Ear/Nose/Mouth/Throat Medical History: Positive for: Chronic sinus problems/congestion; Middle ear problems Respiratory Complaints and Symptoms: No Complaints or Symptoms Cardiovascular Complaints and Symptoms: No Complaints or Symptoms Endocrine Medical History: Positive for: Type II Diabetes Time with diabetes: 10 years Treated with: Insulin, Oral agents Blood sugar tested every day: No Blood sugar testing results: Lunch: 350 Psychiatric Complaints and Symptoms: No Complaints or Symptoms HBO Extended History Items Ear/Nose/Mouth/Throat: Eyes: Eyes: Ear/Nose/Mouth/Throat: Chronic sinus Cataracts Glaucoma Middle ear problems problems/congestion Philemon, Alli [name]. ([id number]) Implantable Devices Family and Social  History Current every day smoker Physician Affirmation I have reviewed and agree with the above information. Electronic Signature(s) Signed: 09/18/2018 5:02:25 PM By: Montey Hora Signed: 09/19/2018 12:54:11 AM By: Worthy Keeler PA-C Entered By: Worthy Keeler on 09/18/2018 11:15:36 Husted, Arlina AMarland Kitchen ([id number]) -------------------------------------------------------------------------------- SuperBill Details Patient Name: [name] Date of Service: 09/18/2018 Medical Record Number: [id number] Patient Account Number: [id number] Date of Birth/Sex: 1976-03-10 (42 y.o. F) Treating RN: Montey Hora Primary Care Provider: Priscille Kluver Other Clinician: Referring Provider: Priscille Kluver Treating Provider/Extender: Melburn Hake, HOYT Weeks in Treatment: 6 Diagnosis Coding ICD-10 Codes Code Description E11.621 Type 2 diabetes mellitus with foot ulcer L97.522 Non-pressure chronic ulcer of other part of left foot with fat layer exposed M21.372 Foot drop, left foot Facility Procedures CPT4 Code: [id number] Description: 68616 - DEB SUBQ TISSUE 20 SQ CM/< ICD-10 Diagnosis Description L97.522 Non-pressure chronic ulcer of other part of left foot with fat Modifier: layer exposed Quantity: 1 Physician Procedures CPT4 Code: 8372902 Description: 11155 - WC PHYS SUBQ TISS 20 SQ CM ICD-10 Diagnosis Description L97.522 Non-pressure chronic ulcer of other part of left foot with fat Modifier: layer exposed Quantity: 1 Electronic Signature(s) Signed: 09/19/2018 12:54:11 AM By: Worthy Keeler PA-C Entered By: Worthy Keeler on 09/18/2018 11:17:20

## 2018-09-25 ENCOUNTER — Encounter: Payer: Medicaid Other | Attending: Physician Assistant | Admitting: Physician Assistant

## 2018-09-25 DIAGNOSIS — L97522 Non-pressure chronic ulcer of other part of left foot with fat layer exposed: Secondary | ICD-10-CM | POA: Insufficient documentation

## 2018-09-25 DIAGNOSIS — H409 Unspecified glaucoma: Secondary | ICD-10-CM | POA: Insufficient documentation

## 2018-09-25 DIAGNOSIS — F17210 Nicotine dependence, cigarettes, uncomplicated: Secondary | ICD-10-CM | POA: Diagnosis not present

## 2018-09-25 DIAGNOSIS — Z794 Long term (current) use of insulin: Secondary | ICD-10-CM | POA: Insufficient documentation

## 2018-09-25 DIAGNOSIS — E11621 Type 2 diabetes mellitus with foot ulcer: Secondary | ICD-10-CM | POA: Insufficient documentation

## 2018-09-25 DIAGNOSIS — R569 Unspecified convulsions: Secondary | ICD-10-CM | POA: Insufficient documentation

## 2018-09-25 DIAGNOSIS — Z881 Allergy status to other antibiotic agents status: Secondary | ICD-10-CM | POA: Insufficient documentation

## 2018-09-25 DIAGNOSIS — M21372 Foot drop, left foot: Secondary | ICD-10-CM | POA: Insufficient documentation

## 2018-09-28 ENCOUNTER — Ambulatory Visit (INDEPENDENT_AMBULATORY_CARE_PROVIDER_SITE_OTHER): Payer: Medicaid Other

## 2018-09-28 ENCOUNTER — Ambulatory Visit: Payer: Medicaid Other | Admitting: Sports Medicine

## 2018-09-28 ENCOUNTER — Encounter: Payer: Self-pay | Admitting: Sports Medicine

## 2018-09-28 ENCOUNTER — Other Ambulatory Visit: Payer: Self-pay

## 2018-09-28 DIAGNOSIS — M79672 Pain in left foot: Secondary | ICD-10-CM

## 2018-09-28 DIAGNOSIS — M79671 Pain in right foot: Secondary | ICD-10-CM | POA: Diagnosis not present

## 2018-09-28 DIAGNOSIS — G629 Polyneuropathy, unspecified: Secondary | ICD-10-CM

## 2018-09-28 DIAGNOSIS — M21372 Foot drop, left foot: Secondary | ICD-10-CM

## 2018-09-28 DIAGNOSIS — G8929 Other chronic pain: Secondary | ICD-10-CM

## 2018-09-28 DIAGNOSIS — L97522 Non-pressure chronic ulcer of other part of left foot with fat layer exposed: Secondary | ICD-10-CM

## 2018-09-28 DIAGNOSIS — E1100 Type 2 diabetes mellitus with hyperosmolarity without nonketotic hyperglycemic-hyperosmolar coma (NKHHC): Secondary | ICD-10-CM

## 2018-09-28 MED ORDER — GABAPENTIN 600 MG PO TABS: 600.0000 mg | ORAL_TABLET | Freq: Three times a day (TID) | ORAL | 3 refills | Status: DC

## 2018-09-28 NOTE — Progress Notes (Signed)
Dg rig

## 2018-09-28 NOTE — Progress Notes (Signed)
Subjective: [name] is a 42 y.o. female patient seen in office for evaluation of left foot drop.  Patient reports that she has neuropathy and does not have any pain in her lower extremity but states that because the way her foot is dropped and rubs it has caused a hallux ulcer of which she is getting it treated at the wound care center in [place].  Patient reports that she has been applying in the wound care center has been applying [employer] to the area and states that this has been going on for 1 year and currently they have her in a surgical shoe.  Patient states that she has an appointment with biotech and here in [place] for a ankle brace and would like another opinion if I think an ankle brace would be valuable for her.  Patient is diabetic with last blood sugar recorded at 267 last A1c of 9 and last saw her primary care doctor 3 months ago.  Patient reports that she does not know how she developed foot drop she had a incident where she passed out in the lawn and someone found her and a recent then she has had issues with her foot.  Review of Systems  Musculoskeletal: Positive for falls.  Neurological: Positive for tingling, speech change, focal weakness and seizures.  All other systems reviewed and are negative.    Patient Active Problem List   Diagnosis Date Noted  . Ulcer of great toe, left, with fat layer exposed ([place]) 09/28/2018  . Menometrorrhagia 06/29/2018  . 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 Secondary localized osteoarthrosis of ankle and foot 01/10/2018  . Chronic foot pain, left 02/20/2017  . Leg edema, left 02/20/2017  . 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 Urinary retention with incomplete bladder emptying 05/02/2014  . 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 Cocaine abuse ([place]) 04/23/2014  . Bipolar disorder, unspecified ([place]) 04/23/2014  . Morbid obesity ([place]) 04/23/2014  . DM type 2 (diabetes mellitus, type  2)- poorly controlled 04/23/2014  . Toxic encephalopathy 04/20/2014  . 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 Lactic acid acidosis 04/20/2014  . 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 HCAP (healthcare-associated pneumonia) 04/20/2014  . Post-traumatic stress disorder, unspecified 03/20/2014  . Severe cannabis use disorder ([place]) 03/20/2014   Current Outpatient Medications on File Prior to Visit  Medication Sig Dispense Refill  . amitriptyline (ELAVIL) 50 MG tablet Take 1 tablet (50 mg total) by mouth at bedtime. 30 tablet 0  . bisacodyl (DULCOLAX) 10 MG suppository Place 1 suppository (10 mg total) rectally daily after supper. 30 suppository 0  . divalproex (DEPAKOTE) 500 MG DR tablet Take 1 tablet (500 mg total) by mouth every 8 (eight) hours. 90 tablet 0  . gabapentin (NEURONTIN) 300 MG capsule Take 1 capsule (300 mg total) by mouth 4 (four) times daily. 120 capsule 0  . insulin detemir (LEVEMIR) 100 UNIT/ML injection Inject 0.24 mLs (24 Units total) into the skin at bedtime. 10 mL 11  . insulin lispro (HUMALOG) 100 UNIT/ML injection Inject 2-12 Units into the skin 3 (three) times daily with meals.    . Insulin Syringes, Disposable, U-100 0.3 ML MISC Inject 24 Units into the skin at bedtime. 100 each 0  . QUEtiapine (SEROQUEL) 100 MG tablet Take 1 tablet (100 mg total) by mouth at bedtime. 30 tablet 0   No current facility-administered medications on file prior to visit.    Allergies  Allergen Reactions  .  Albumin (Human)   . Midecamycin   . Erythromycin Rash    No results found for this or any previous visit (from the past 2160 hour(s)).  Objective: There were no vitals filed for this visit.  General: Patient is awake, alert, oriented x 3 and in no acute distress.  Dermatology: Skin is warm and dry bilateral with a full thickness ulceration present  Plantar left hallux. Ulceration measures greater than 1 cm with a granular base and mildly keratotic margin. The ulceration  does not  probe to bone. There is no malodor, no active drainage, no erythema, no edema. No acute signs of infection.   Vascular: Dorsalis Pedis pulse = 1/4 Bilateral,  Posterior Tibial pulse = 0/4 Bilateral,  Capillary Fill Time < 5 seconds  Neurologic: Protective sensation absent bilateral left greater than right  Musculosketal: There is 3 out of 5 muscle motion on the left foot compared to the right her left foot sits in a dropfoot position with random twitches and patient reports that she does have nerve pain and currently has been off her gabapentin because her neurologist moved to [place], Left foot: Ulcer defect at the left hallux.  No bony destruction suggestive of osteomyelitis. No gas in soft tissues.   Recent Labs    01/18/18 1314 01/25/18 1501 02/15/18 1052 06/07/18 1115  GRAMSTAIN FEW WBC PRESENT, PREDOMINANTLY MONONUCLEAR FEW GRAM POSITIVE COCCI Performed at [facility], [place] [address] [place], [place] [address]  RARE WBC PRESENT,BOTH PMN AND MONONUCLEAR NO ORGANISMS SEEN Performed at [facility], [place] [address], [place], [place] [address]  RARE WBC PRESENT, PREDOMINANTLY PMN FEW GRAM POSITIVE COCCI IN PAIRS IN CHAINS  NO WBC SEEN RARE GRAM POSITIVE COCCI Performed at [facility], [place] [address], [place],  [address]   [place]  ENTEROCOCCUS FAECALIS STAPHYLOCOCCUS AUREUS STAPHYLOCOCCUS AUREUS STAPHYLOCOCCUS AUREUS  GROUP B STREP(S.AGALACTIAE)ISOLATED    Assessment and Plan:  Problem List Items Addressed This Visit      Other   Chronic foot pain, left   Relevant Medications   gabapentin (NEURONTIN) 600 MG tablet   Ulcer of great toe, left, with fat layer exposed ([place])    Other Visit Diagnoses    Bilateral foot pain    -  Primary   Relevant Orders   DG Foot Complete Left   Neuropathy       Relevant Medications   gabapentin (NEURONTIN) 600 MG tablet   Left foot drop       Relevant Medications    gabapentin (NEURONTIN) 600 MG tablet       -Examined patient and discussed the progression of the wound and treatment alternatives in the setting of foot drop. -Xrays reviewed -Applied dry sterile dressing to left hallux toe ulcer and advised patient to continue with follow-up at wound care center for continued wound care  -Advised patient to go to the ER or return to office if the wound worsens or if constitutional symptoms are present. -Advised patient that I agree with plan of her getting a dorsi flexion assisted brace to try to help with her foot drop deformity patient is already scheduled to see biotech and advised patient to keep this appointment -Advised patient that she needs to see neurology for them to continue to monitor her neuropathy especially since she also currently has episodes of seizures but meanwhile since she has ran out of her gabapentin and I know that this medication can make increased  risk for seizures I did refill it for her until she sees her neurologist and we put in a new neurology consult since her previous neurologist went to [facility] -Patient to return to office as needed or sooner if problems arise.  Landis Martins, DPM

## 2018-09-29 NOTE — Progress Notes (Signed)
JAPLEEN, TORNOW ([id number]) Visit Report for 09/25/2018 Chief Complaint Document Details Patient Name: [name], [name] Date of Service: 09/25/2018 12:45 PM Medical Record Number: [id number] Patient Account Number: [id number] Date of Birth/Sex: May 30, 1976 (42 y.o. F) Treating RN: Montey Hora Primary Care Provider: Priscille Kluver Other Clinician: Referring Provider: Priscille Kluver Treating Provider/Extender: Melburn Hake, Joshua Zeringue Weeks in Treatment: 7 Information Obtained from: Patient Chief Complaint She is here for evaluation of a left great toe ulcer Electronic Signature(s) Signed: 09/27/2018 1:15:36 AM By: Worthy Keeler PA-C Entered By: Worthy Keeler on 09/25/2018 13:12:42 [name] ([id number]) -------------------------------------------------------------------------------- Debridement Details Patient Name: Heather Dear A. Date of Service: 09/25/2018 12:45 PM Medical Record Number: [id number] Patient Account Number: [id number] Date of Birth/Sex: 1976-04-22 (42 y.o. F) Treating RN: Montey Hora Primary Care Provider: Priscille Kluver Other Clinician: Referring Provider: Priscille Kluver Treating Provider/Extender: STONE III, Tarrence Enck Weeks in Treatment: 7 Debridement Performed for Wound #4R Left Toe Great Assessment: Performed By: Physician STONE III, Lequisha Cammack E., PA-C Debridement Type: Debridement Severity of Tissue Pre Fat layer exposed Debridement: Level of Consciousness (Pre- Awake and Alert procedure): Pre-procedure Verification/Time Yes - 13:21 Out Taken: Start Time: 13:21 Pain Control: Lidocaine 4% Topical Solution Total Area Debrided (L x W): 1.2 (cm) x 0.4 (cm) = 0.48 (cm) Tissue and other material Viable, Non-Viable, Callus, Slough, Subcutaneous, Slough debrided: Level: Skin/Subcutaneous Tissue Debridement Description: Excisional Instrument: Curette Bleeding: Minimum Hemostasis Achieved: Pressure End Time: 13:24 Procedural Pain: 0 Post Procedural Pain:  0 Response to Treatment: Procedure was tolerated well Level of Consciousness Awake and Alert (Post-procedure): Post Debridement Measurements of Total Wound Length: (cm) 1.3 Width: (cm) 0.6 Depth: (cm) 0.3 Volume: (cm) 0.184 Character of Wound/Ulcer Post Debridement: Improved Severity of Tissue Post Debridement: Fat layer exposed Post Procedure Diagnosis Same as Pre-procedure Electronic Signature(s) Signed: 09/25/2018 5:21:38 PM By: Montey Hora Signed: 09/27/2018 1:15:36 AM By: Worthy Keeler PA-C Entered By: Montey Hora on 09/25/2018 13:24:54 [name], [name] ([id number]) -------------------------------------------------------------------------------- HPI Details Patient Name: Heather Dear A. Date of Service: 09/25/2018 12:45 PM Medical Record Number: [id number] Patient Account Number: [id number] Date of Birth/Sex: [birth year] (42 y.o. F) Treating RN: Montey Hora Primary Care Provider: Priscille Kluver Other Clinician: Referring Provider: Priscille Kluver Treating Provider/Extender: Melburn Hake, Lonnel Gjerde Weeks in Treatment: 7 History of Present Illness HPI Description: 01/18/18-She is here for initial evaluation of the left great toe ulcer. She is a poor historian in regards to timeframe in detail. She states approximately 4 weeks ago she lacerated her toe on something in the house. She followed up with her primary care who placed her on Bactrim and ultimately a second dose of Bactrim prior to coming to wound clinic. She states she has been treating the toe with peroxide, Betadine and a Band-Aid. She did not check her blood sugar this morning but checked it yesterday morning it was 327; she is unaware of a recent A1c and there are no current records. She saw Dr. she would've orthopedics last week for an old injury to the left ankle, she states he did not see her toe, nor did she bring it to his attention. She smokes approximately 1 pack cigarettes a day. Her social situation is  concerning, she arrives this morning with her mother who appears extremely intoxicated/under the influence; her mother was asked to leave the room and be monitored by the patient's grandmother. The patient's aunt then accompanied the patient and the room throughout the rest of the appointment. We had a lengthy discussion regarding the  deleterious effects of uncontrolled hyperglycemia and smoking as it relates to wound healing and overall health. She was strongly encouraged to decrease her smoking and get her diabetes under better control. She states she is currently on a diet and has cut down her [facility] consumption. The left toe is erythematous, macerated and slightly edematous with malodor present. The edema in her left foot is below her baseline, there is no erythema streaking. We will treat her with Santyl, doxycycline; we have ordered and xray, culture and provided a Peg assist surgical shoe and cultured the wound. 01/25/18-She is here in follow-up evaluation for a left great toe ulcer and presents with an abscess to her suprapubic area. She states her blood sugars remain elevated, feeling "sick" and if levels are below 250, but she is trying. She has made no attempt to decrease her smoking stating that we "can't take away her food in her cigarettes". She has been compliant with offloading using the PEG assist you. She is using Santyl daily. the culture obtained last week grew staph aureus and Enterococcus faecalis; continues on the doxycycline and Augmentin was added on Monday. The suprapubic area has erythema, no femoral variation, purple discoloration, minimal induration, was accessed with a cotton tip applicator with sanguinopurulent drainage, this was cultured, I suspect the current antibiotic treatment will cover and we will not add anything to her current treatment plan. She was advised to go to urgent care or ER with any change in redness, induration or fever. 02/01/18-She is here in  follow-up evaluation for left great toe ulcers and a new abdominal abscess from last week. She was able to use packing until earlier this week, where she "forgot it was there". She states she was feeling ill with GI symptoms last week and was not taking her antibiotic. She states her glucose levels have been predominantly less than 200, with occasional levels between 200-250. She thinks this was contributing to her GI symptoms as they have resolved without intervention. There continues to be significant laceration to left toe, otherwise it clinically looks stable/improved. There is now less superficial opening to the lateral aspect of the great toe that was residual blister. We will transition to [facility] to all wounds, she will continue her Augmentin. If there is no change or deterioration next week for reculture. 02/08/18-She is here in follow-up evaluation for left great toe ulcer and abdominal ulcer. There is an improvement in both wounds. She has been wrapping her left toe with coban, not by our direction, which has created an area of discoloration to the medial aspect; she has been advised to NOT use coban secondary to her neuropathy. She states her glucose levels have been high over this last week ranging from 200-350, she continues to smoke. She admits to being less compliant with her offloading shoe. We will continue with same treatment plan and she will follow-up next week. 02/15/18-She is here in follow-up evaluation for left great toe ulcer and abdominal ulcer. The abdominal ulcer is epithelialized. The left great toe ulcer is improved and all injury from last week using the Coban wrap is resolved, the lateral ulcer is healed. She admits to noncompliance with wearing offloading shoe and admits to glucose levels being greater than 300 most of the week. She continues to smoke and expresses no desire to quit. There is one area medially that probes deeper than it has historically,  erythema to the toe and dorsal foot has consistently waxed and waned. There is no overt  signs of cellulitis or infection but we will culture the wound for any occult infection given the new area of depth and erythema. We will hold off on sensitivities for initiation of antibiotic therapy. 02/22/18-She is here in follow up evaluation for left great toe ulcer. There is overall significant improvement in both wound appearance, erythema and edema with changes made last week. She was not initiated on antibiotic therapy. Culture obtained last week showed oxacillin sensitive staph aureus, sensitive to clindamycin. Clindamycin has been called into the pharmacy but she has been instructed to hold off on initiation secondary to overall clinical improvement and her history of antibiotic intolerance. She has been instructed to contact the clinic with any noted changes/deterioration and the wound, erythema, Burling, Stashia A. ([id number]) edema and/or pain. She will follow-up next week. She continues to smoke and her glucose levels remain elevated >250; she admits to compliance with offloading shoe 03/01/18 on evaluation today patient appears to be doing fairly well in regard to her left first toe ulcer. She has been tolerating the dressing changes with the [facility] Dressing without complication and overall this has definitely showed signs of improvement according to records as well is what the patient tells me today. I'm very pleased in that regard. She is having no pain today 03/08/18 She is here for follow up evaluation of a left great toe ulcer. She remains non-compliant with glucose control and smoking cessation; glucose levels consistently >200. She states that she got new shoe inserts/peg assist. She admits to compliance with offloading. Since my last evaluation there is significant improvement. We will switch to prisma at this time and she will follow up next week. She is noted to be tachycardic at this  appointment, heart rate 120s; she has a history of heart rate 70-130 according to our records. She admits to extreme agitation r/t personal issues; she was advised to monitor her heartrate and contact her physician if it does not return to a more normal range (<100). She takes cardizem twice daily. 03/15/18-She is here in follow-up evaluation for left great toe ulcer. She remains noncompliant with glucose control and smoking cessation. She admits to compliance with wearing offloading shoe. The ulcer is improved/stable and we will continue with the same treatment plan and she will follow-up next week 03/22/18-She is here for evaluation for left great toe ulcer. There continues to be significant improvement despite recurrent hyperglycemia (over 500 yesterday) and she continues to smoke. She has been compliant with offloading and we will continue with same treatment plan and she will follow-up next week. 03/29/18-She is here for evaluation for left great toe ulcer. Despite continuing to smoke and uncontrolled diabetes she continues to improve. She is compliant with offloading shoe. We will continue with the same treatment plan and she will follow-up next week 04/05/18- She is here in follow up evaluation for a left great toe ulcer; she presents with small pustule to left fifth toe (resembles ant bite). She admits to compliance with wearing offloading shoe; continues to smoke or have uncontrolled blood glucose control. There is more callus than usual with evidence of bleeding; she denies known trauma. 04/12/18-She is here for evaluation of left great toe ulcer. Despite noncompliance with glycemic control and smoking she continues to make improvement. She continues to wear offloading shoe. The pustule, that was identified last week, to the left fifth toe is resolved. She will follow-up in 2 weeks 05/03/18-she is seen in follow-up evaluation for a left great toe ulcer. She  is compliant with offloading,  otherwise noncompliant with glycemic control and smoking. She has plateaued and there is minimal improvement noted. We will transition to [facility], replaced the insert to her surgical shoe and she will follow-up in one week 05/10/18- She is here in follow up evaluation for a left great toe ulcer. It appears stable despite measurement change. We will continue with same treatment plan and follow up next week. 05/24/18-She is seen in follow-up evaluation for a left great toe ulcer. She remains compliant with offloading, has made significant improvement in her diet, decreasing the amount of sugar/soda. She said her recent A1c was 10.9 which is lower than. She did see a diabetic nutritionist/educator yesterday. She continues to smoke. We will continue with the same treatment plan and she'll follow-up next week. 05/31/18- She is seen in follow-up evaluation for left great toe ulcer. She continues to remain compliant with offloading, continues to make improvement in her diet, increasing her water and decreasing the amount of sugar/soda. She does continue to smoke with no desire to quit. We will apply Prisma to the depth and Hydrofera Blue over. We have not received insurance authorization for oasis. She will follow up next week. 06/07/18-She is seen in follow-up evaluation for left great toe ulcer. It has stalled according to today's measurements although base appears stable. She says she saw a diabetic educator yesterday; her average blood sugars are less than 300 which is an improvement for her. She continues to smoke and states "that's my next step" She continues with water over soda. We will order for xray, culture and reinstate ace wrap compression prior to placing apligraf for next week. She is voicing no complaints or concerns. Her dressing will change to iodoflex over the next week in preparation for apligraf. 06/14/18-She is seen in follow-up evaluation for left great toe ulcer. Plain film x-ray  performed last week was negative for osteomyelitis. Wound culture obtained last week grew strep B and OSSA; she is initiated on keflex and cefdinir today; there is erythema to the toe which could be from ace wrap compression, she has a history of wrapping too tight and has has been encouraged to maintain ace wraps that we place today. We will hold off on application of apligraf today, will apply next week after antibiotic therapy has been initiated. She admits today that she has resumed taking a shower with her foot/toe submerged in water, she has been reminded to keep foot/toe out of the bath water. She will be seen in follow up next week 06/21/18-she is seen in follow-up evaluation for left great toe ulcer. She is tolerating antibiotic therapy with no GI disturbance. The wound is stable. Apligraf was applied today. She has been decreasing her smoking, only had 4 cigarettes yesterday and 1 today. She continues being more compliant in diabetic diet. She will follow-up next week for evaluation of site, if stable will remove at 2 weeks. 06/28/18- She is here in follow up evalution. Apligraf was placed last week, she states the dressing fell off on Tuesday and she was dressing with hydrofera blue. She is healed and will be discharged from the clinic today. She has been instructed to continue with smoking cessation, continue monitoring glucose levels, offloading for an additional 4 weeks and continue with Tena, Ricca A. ([id number]) hydrofera blue for additional two weeks for any possible microscopic opening. Readmission: 08/07/18 on evaluation today patient presents for reevaluation concerning the ulcer of her right great toe. She was previously discharged on  06/28/18 healed. Nonetheless she states that this began to show signs of drainage she subsequently went to her primary care provider. Subsequently an x-ray was performed on 08/01/18 which was negative. The patient was also placed on antibiotics at  that time. Fortunately they should have been effective for the infection. Nonetheless she's been experiencing some improvement but still has a lot of drainage coming from the wound itself. 08/14/18 on evaluation today patient's wound actually does show signs of improvement in regard to the erythema at this point. She has completed the antibiotics. With that being said we did discuss the possibility of placing her in a total contact cast as of today although I think that I may want to give this just a little bit more time to ensure nothing recurrence as far as her infection is concerned. I do not want to put in the cast and risk infection at that time if things are not completely resolved. With that being said she is gonna require some debridement today. 08/21/18 on evaluation today patient actually appears to be doing okay in regard to her toe ulcer. She's been tolerating the dressing changes without complication. With that being said it does appear that she is ready and in fact I think it's appropriate for [place] to go ahead and initiate the total contact cast today. Nonetheless she will require some sharp debridement to prepare the wound for application. Overall I feel like things have been progressing well but we do need to do something to get this to close more readily. 08/24/18 patient seen today for reevaluation after having had the total contact cast applied on Tuesday. She seems to have done very well the wound appears to be doing great and overall I'm pleased with the progress that she's made. There were no abnormal areas of rubbing from the cast on her lower extremity. 08/30/18 on evaluation today patient actually appears to be completely healed in regard to her plantar toe ulcer. She tells me at this point she's been having a lot of issues with the cast. She almost fell a couple of times the state shall the step of her dog a couple times as well. This is been a very frustrating process for her  other nonetheless she has completely healed the wound which is excellent news. Overall there does not appear to be the evidence of infection at this time which is great news. 09/11/18 evaluation today patient presents for follow-up concerning her great toe ulcer on the left which has unfortunately reopened since I last saw her which was only a couple of weeks ago. Unfortunately she was not able to get in to get the shoe and potentially the AFO that's gonna be necessary due to her left foot drop. She continues with offloading shoe but this is not enough to prevent her from reopening it appears. When we last had her in the total contact cast she did well from a healing standpoint but unfortunately the wound reopened as soon as she came out of the cast within just a couple of weeks. 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 This is also part of the reason why she falls on a regular basis. I just do not believe that is gonna be safe for [place] to reinitiate the total  contact cast as last time we had this on she fell 3 times one week which is definitely not normal for her. 09/18/18 upon evaluation today the patient actually appears to be doing about the same in regard to her toe ulcer. She did not contact [facility] as I asked her to even though I had given her the prescription. In fact she actually states that she has no idea where the prescription is. She did apparently call [facility] and they told her that all she needed to do was bring the prescription in order to be able to be seen and work on getting the AFO for her left foot. With all that being said she still does not have an appointment and I'm not sure were things stand that regard. I will give her a new prescription today in order to contact them to get  this set up. 09/25/18 on evaluation today patient actually appears to be doing about the same in regard to her toes ulcer. She does have a small areas which seems to have a lot of callous buildup around the edge of the wound which is going to need sharp debridement today. She still is waiting to be scheduled for evaluation with [facility] for possibility of an AFO. She states there supposed to call her tomorrow to get this set up. Unfortunately it does appear that her foot specifically the toe area is showing signs of erythema. There does not appear to be any systemic infection which is in these good news. Electronic Signature(s) Signed: 09/27/2018 1:15:36 AM By: Worthy Keeler PA-C Entered By: Worthy Keeler on 09/25/2018 13:39:25 [name], Heather Duke ([id number]) -------------------------------------------------------------------------------- Physical Exam Details Patient Name: Heather Dear A. Date of Service: 09/25/2018 12:45 PM Medical Record Number: [id number] Patient Account Number: [id number] Date of Birth/Sex: [birth year] (42 y.o. F) Treating RN: Montey Hora Primary Care Provider: Priscille Kluver Other Clinician: Referring Provider: Priscille Kluver Treating Provider/Extender: STONE III, Alisabeth Selkirk Weeks in Treatment: 7 Constitutional Well-nourished and well-hydrated in no acute distress. Respiratory normal breathing without difficulty. Psychiatric this patient is able to make decisions and demonstrates good insight into disease process. Alert and Oriented x 3. pleasant and cooperative. Notes Patient's wound bed currently shows evidence of callous surrounding the edge of the wound which did require sharp debridement today. Post debridement this appear to be doing much better. There's also erythema surrounding the wound which has me concerned about cellulitis. That is going to need to be addressed today. Electronic Signature(s) Signed: 09/27/2018 1:15:36 AM By:  Worthy Keeler PA-C Entered By: Worthy Keeler on 09/25/2018 13:39:59 [name] ([id number]) -------------------------------------------------------------------------------- Physician Orders Details Patient Name: Heather Dear A. Date of Service: 09/25/2018 12:45 PM Medical Record Number: [id number] Patient Account Number: [id number] Date of Birth/Sex: [birth year] (42 y.o. 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 Primary Wound Dressing Wound #4R Left Toe Great o Silver Collagen Secondary Dressing Wound #4R Left Toe  Great o Dry Gauze o Conform/Kerlix Dressing Change Frequency Wound #4R Left Toe Great o Change dressing every other day. Follow-up Appointments Wound #4R Left Toe Great o Return Appointment in 1 week. Off-Loading o Other: - Referral to [facility] for AFO Patient Medications Allergies: erythromycin base Notifications Medication Indication Start End Bactrim DS 09/25/2018 DOSE 1 - oral 800 mg-160 mg tablet - 1 tablet oral taken 2 times a day for 10 days JEANE, CASHATT A. ([id number]) Electronic Signature(s) Signed: 09/25/2018 1:27:49 PM By: Worthy Keeler PA-C Entered By: Worthy Keeler on 09/25/2018 13:27:49 Needle, Anela A. ([id number]) -------------------------------------------------------------------------------- Problem  List Details Patient Name: Heather Dear A. Date of Service: 09/25/2018 12:45 PM Medical Record Number: [id number] Patient Account Number: [id number] Date of Birth/Sex: [birth year] (42 y.o. 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 ([id number]) -------------------------------------------------------------------------------- Progress Note Details Patient Name: Heather Dear A. Date of Service: 09/25/2018 12:45 PM Medical Record Number: [id number] Patient Account Number: [id number] Date of Birth/Sex: May 26, 1976 (42 y.o. F) Treating RN: Montey Hora Primary Care Provider: Priscille Kluver Other Clinician: Referring Provider: Priscille Kluver Treating Provider/Extender: Melburn Hake, Welton Bord Weeks in Treatment: 7 Subjective Chief Complaint Information obtained from Patient She is here for evaluation of a left great toe ulcer History of Present Illness (HPI) 01/18/18-She is here for initial evaluation of the left great toe ulcer. She is a poor historian in regards to timeframe in detail. She states approximately 4 weeks ago she lacerated her toe on something in the house. She followed up with her primary care who placed her on Bactrim and ultimately a second dose of Bactrim  prior to coming to wound clinic. She states she has been treating the toe with peroxide, Betadine and a Band-Aid. She did not check her blood sugar this morning but checked it yesterday morning it was 327; she is unaware of a recent A1c and there are no current records. She saw Dr. she would've orthopedics last week for an old injury to the left ankle, she states he did not see her toe, nor did she bring it to his attention. She smokes approximately 1 pack cigarettes a day. Her social situation is concerning, she arrives this morning with her mother who appears extremely intoxicated/under the influence; her mother was asked to leave the room and be monitored by the patient's grandmother. The patient's aunt then accompanied the patient and the room throughout the rest of the appointment. We had a lengthy discussion regarding the deleterious effects of uncontrolled hyperglycemia and smoking as it relates to wound healing and overall health. She was strongly encouraged to decrease her smoking and get her diabetes under better control. She states she is currently on a diet and has cut down her [facility] consumption. The left toe is erythematous, macerated and slightly edematous with malodor present. The edema in her left foot is below her baseline, there is no  erythema streaking. We will treat her with Santyl, doxycycline; we have ordered and xray, culture and provided a Peg assist surgical shoe and cultured the wound. 01/25/18-She is here in follow-up evaluation for a left great toe ulcer and presents with an abscess to her suprapubic area. She states her blood sugars remain elevated, feeling "sick" and if levels are below 250, but she is trying. She has made no attempt to decrease her smoking stating that we "can't take away her food in her cigarettes". She has been compliant with offloading using the PEG assist you. She is using Santyl daily. the culture obtained last week grew staph aureus  and Enterococcus faecalis; continues on the doxycycline and Augmentin was added on Monday. The suprapubic area has erythema, no femoral variation, purple discoloration, minimal induration, was accessed with a cotton tip applicator with sanguinopurulent drainage, this was cultured, I suspect the current antibiotic treatment will cover and we will not add anything to her current treatment plan. She was advised to go to urgent care or ER with any change in redness, induration or fever. 02/01/18-She is here in follow-up evaluation for left great toe ulcers and a new abdominal abscess from last week. She was able to use packing until earlier this week, where she "forgot it was there". She states she was feeling ill with GI symptoms last week and was not taking her antibiotic. She states her glucose levels have been predominantly less than 200, with occasional levels between 200-250. She thinks this was contributing to her GI symptoms as they have resolved without intervention. There continues to be significant laceration to left toe, otherwise it clinically looks stable/improved. There is now less superficial opening to the lateral aspect of the great toe that was residual blister. We will transition to [facility] to all wounds, she will continue her Augmentin. If there is no change or deterioration next week for reculture. 02/08/18-She is here in follow-up evaluation for left great toe ulcer and abdominal ulcer. There is an improvement in both wounds. She has been wrapping her left toe with coban, not by our direction, which has created an area of discoloration to the medial aspect; she has been advised to NOT use coban secondary to her neuropathy. She states her glucose levels have been high over this last week ranging from 200-350, she continues to smoke. She admits to being less compliant with her offloading shoe. We will continue with same treatment plan and she will follow-up next  week. 02/15/18-She is here in follow-up evaluation for left great toe ulcer and abdominal ulcer. The abdominal ulcer is epithelialized. The left great toe ulcer is improved and all injury from last week using the Coban wrap is resolved, the lateral ulcer is healed. She admits to noncompliance with wearing offloading shoe and admits to glucose levels being greater than 300 most of the week. She continues to smoke and expresses no desire to quit. There is one area medially that probes deeper than it has historically, erythema to the toe and dorsal foot has consistently waxed and waned. There is no overt signs of cellulitis or infection but we will culture the wound for any occult infection given the new area of depth and erythema. We will hold off on [name], [name] ([id number]) sensitivities for initiation of antibiotic therapy. 02/22/18-She is here in follow up evaluation for left great toe ulcer. There is overall significant improvement in both wound appearance, erythema and edema with changes made last week. She was not initiated on  antibiotic therapy. Culture obtained last week showed oxacillin sensitive staph aureus, sensitive to clindamycin. Clindamycin has been called into the pharmacy but she has been instructed to hold off on initiation secondary to overall clinical improvement and her history of antibiotic intolerance. She has been instructed to contact the clinic with any noted changes/deterioration and the wound, erythema, edema and/or pain. She will follow-up next week. She continues to smoke and her glucose levels remain elevated >250; she admits to compliance with offloading shoe 03/01/18 on evaluation today patient appears to be doing fairly well in regard to her left first toe ulcer. She has been tolerating the dressing changes with the [facility] Dressing without complication and overall this has definitely showed signs of improvement according to records as well is what the  patient tells me today. I'm very pleased in that regard. She is having no pain today 03/08/18 She is here for follow up evaluation of a left great toe ulcer. She remains non-compliant with glucose control and smoking cessation; glucose levels consistently >200. She states that she got new shoe inserts/peg assist. She admits to compliance with offloading. Since my last evaluation there is significant improvement. We will switch to prisma at this time and she will follow up next week. She is noted to be tachycardic at this appointment, heart rate 120s; she has a history of heart rate 70-130 according to our records. She admits to extreme agitation r/t personal issues; she was advised to monitor her heartrate and contact her physician if it does not return to a more normal range (<100). She takes cardizem twice daily. 03/15/18-She is here in follow-up evaluation for left great toe ulcer. She remains noncompliant with glucose control and smoking cessation. She admits to compliance with wearing offloading shoe. The ulcer is improved/stable and we will continue with the same treatment plan and she will follow-up next week 03/22/18-She is here for evaluation for left great toe ulcer. There continues to be significant improvement despite recurrent hyperglycemia (over 500 yesterday) and she continues to smoke. She has been compliant with offloading and we will continue with same treatment plan and she will follow-up next week. 03/29/18-She is here for evaluation for left great toe ulcer. Despite continuing to smoke and uncontrolled diabetes she continues to improve. She is compliant with offloading shoe. We will continue with the same treatment plan and she will follow-up next week 04/05/18- She is here in follow up evaluation for a left great toe ulcer; she presents with small pustule to left fifth toe (resembles ant bite). She admits to compliance with wearing offloading shoe; continues to smoke or have  uncontrolled blood glucose control. There is more callus than usual with evidence of bleeding; she denies known trauma. 04/12/18-She is here for evaluation of left great toe ulcer. Despite noncompliance with glycemic control and smoking she continues to make improvement. She continues to wear offloading shoe. The pustule, that was identified last week, to the left fifth toe is resolved. She will follow-up in 2 weeks 05/03/18-she is seen in follow-up evaluation for a left great toe ulcer. She is compliant with offloading, otherwise noncompliant with glycemic control and smoking. She has plateaued and there is minimal improvement noted. We will transition to [facility], replaced the insert to her surgical shoe and she will follow-up in one week 05/10/18- She is here in follow up evaluation for a left great toe ulcer. It appears stable despite measurement change. We will continue with same treatment plan and follow up next  week. 05/24/18-She is seen in follow-up evaluation for a left great toe ulcer. She remains compliant with offloading, has made significant improvement in her diet, decreasing the amount of sugar/soda. She said her recent A1c was 10.9 which is lower than. She did see a diabetic nutritionist/educator yesterday. She continues to smoke. We will continue with the same treatment plan and she'll follow-up next week. 05/31/18- She is seen in follow-up evaluation for left great toe ulcer. She continues to remain compliant with offloading, continues to make improvement in her diet, increasing her water and decreasing the amount of sugar/soda. She does continue to smoke with no desire to quit. We will apply Prisma to the depth and Hydrofera Blue over. We have not received insurance authorization for oasis. She will follow up next week. 06/07/18-She is seen in follow-up evaluation for left great toe ulcer. It has stalled according to today's measurements although base appears stable. She says she  saw a diabetic educator yesterday; her average blood sugars are less than 300 which is an improvement for her. She continues to smoke and states "that's my next step" She continues with water over soda. We will order for xray, culture and reinstate ace wrap compression prior to placing apligraf for next week. She is voicing no complaints or concerns. Her dressing will change to iodoflex over the next week in preparation for apligraf. 06/14/18-She is seen in follow-up evaluation for left great toe ulcer. Plain film x-ray performed last week was negative for osteomyelitis. Wound culture obtained last week grew strep B and OSSA; she is initiated on keflex and cefdinir today; there is erythema to the toe which could be from ace wrap compression, she has a history of wrapping too tight and has has been encouraged to maintain ace wraps that we place today. We will hold off on application of apligraf today, will apply next week after antibiotic therapy has been initiated. She admits today that she has resumed taking a shower with her foot/toe submerged in water, she has been reminded to keep foot/toe out of the bath water. She will be seen in follow up next week 06/21/18-she is seen in follow-up evaluation for left great toe ulcer. She is tolerating antibiotic therapy with no GI disturbance. The wound is stable. Apligraf was applied today. She has been decreasing her smoking, only had 4 cigarettes yesterday and [name], [name] ([id number]) 1 today. She continues being more compliant in diabetic diet. She will follow-up next week for evaluation of site, if stable will remove at 2 weeks. 06/28/18- She is here in follow up evalution. Apligraf was placed last week, she states the dressing fell off on Tuesday and she was dressing with hydrofera blue. She is healed and will be discharged from the clinic today. She has been instructed to continue with smoking cessation, continue monitoring glucose levels, offloading  for an additional 4 weeks and continue with hydrofera blue for additional two weeks for any possible microscopic opening. Readmission: 08/07/18 on evaluation today patient presents for reevaluation concerning the ulcer of her right great toe. She was previously discharged on 06/28/18 healed. Nonetheless she states that this began to show signs of drainage she subsequently went to her primary care provider. Subsequently an x-ray was performed on 08/01/18 which was negative. The patient was also placed on antibiotics at that time. Fortunately they should have been effective for the infection. Nonetheless she's been experiencing some improvement but still has a lot of drainage coming from the wound itself. 08/14/18 on evaluation  today patient's wound actually does show signs of improvement in regard to the erythema at this point. She has completed the antibiotics. With that being said we did discuss the possibility of placing her in a total contact cast as of today although I think that I may want to give this just a little bit more time to ensure nothing recurrence as far as her infection is concerned. I do not want to put in the cast and risk infection at that time if things are not completely resolved. With that being said she is gonna require some debridement today. 08/21/18 on evaluation today patient actually appears to be doing okay in regard to her toe ulcer. She's been tolerating the dressing changes without complication. With that being said it does appear that she is ready and in fact I think it's appropriate for [place] to go ahead and initiate the total contact cast today. Nonetheless she will require some sharp debridement to prepare the wound for application. Overall I feel like things have been progressing well but we do need to do something to get this to close more readily. 08/24/18 patient seen today for reevaluation after having had the total contact cast applied on Tuesday. She seems to  have done very well the wound appears to be doing great and overall I'm pleased with the progress that she's made. There were no abnormal areas of rubbing from the cast on her lower extremity. 08/30/18 on evaluation today patient actually appears to be completely healed in regard to her plantar toe ulcer. She tells me at this point she's been having a lot of issues with the cast. She almost fell a couple of times the state shall the step of her dog a couple times as well. This is been a very frustrating process for her other nonetheless she has completely healed the wound which is excellent news. Overall there does not appear to be the evidence of infection at this time which is great news. 09/11/18 evaluation today patient presents for follow-up concerning her great toe ulcer on the left which has unfortunately reopened since I last saw her which was only a couple of weeks ago. Unfortunately she was not able to get in to get the shoe and potentially the AFO that's gonna be necessary due to her left foot drop. She continues with offloading shoe but this is not enough to prevent her from reopening it appears. When we last had her in the total contact cast she did well from a healing standpoint but unfortunately the wound reopened as soon as she came out of the cast within just a couple of weeks. 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 This is also part of the reason why she falls on a regular basis. I just do not believe that is gonna be safe for [place] to reinitiate the total contact cast as last time we had this on she fell 3 times one week which is definitely not normal for her. 09/18/18 upon evaluation today the patient actually appears to be doing about the same in regard to her toe ulcer. She did  not contact [employer] as I asked her to even though I had given her the prescription. In fact she actually states that she has no idea where  the prescription is. She did apparently call [employer] and they told her that all she needed to do was bring the prescription in order to be able to be seen and work on getting the AFO for her left foot. With all that being said she still does not have an appointment and I'm not sure were things stand that regard. I will give her a new prescription today in order to contact them to get this set up. 09/25/18 on evaluation today patient actually appears to be doing about the same in regard to her toes ulcer. She does have a small areas which seems to have a lot of callous buildup around the edge of the wound which is going to need sharp debridement today. She still is waiting to be scheduled for evaluation with [employer] for possibility of an AFO. She states there supposed to call her tomorrow to get this set up. Unfortunately it does appear that her foot specifically the toe area is showing signs of erythema. There does not appear to be any systemic infection which is in these good news. CHERAE, MARTON A. ([id number]) Patient History Information obtained from Patient. Social History Current every day smoker. Review of Systems (ROS) Constitutional Symptoms (General Health) Denies complaints or symptoms of Fever, Chills. Respiratory The patient has no complaints or symptoms. Cardiovascular The patient has no complaints or symptoms. Psychiatric The patient has no complaints or symptoms. Objective Constitutional Well-nourished and well-hydrated in no acute distress. Vitals Time Taken: 12:57 PM, Height: 69 in, Weight: 240 lbs, BMI: 35.4, Temperature: 98.1 F, Pulse: 105 bpm, Respiratory Rate: 18 breaths/min, Blood Pressure: 120/86 mmHg. Respiratory normal breathing without difficulty. Psychiatric this patient is able to make decisions and demonstrates good  insight into disease process. Alert and Oriented x 3. pleasant and cooperative. General Notes: Patient's wound bed currently shows evidence of callous surrounding the edge of the wound which did require sharp debridement today. Post debridement this appear to be doing much better. There's also erythema surrounding the wound which has me concerned about cellulitis. That is going to need to be addressed today. Integumentary (Hair, Skin) Wound #4R status is Open. Original cause of wound was Gradually Appeared. The wound is located on the Left Toe Great. The wound measures 1.2cm length x 0.4cm width x 0.4cm depth; 0.377cm^2 area and 0.151cm^3 volume. There is Fat Layer (Subcutaneous Tissue) Exposed exposed. There is no tunneling noted, however, there is undermining starting at 1:00 and ending at 12:00 with a maximum distance of 0.2cm. There is a small amount of serous drainage noted. The wound margin is thickened. There is no granulation within the wound bed. There is no necrotic tissue within the wound bed. The periwound skin appearance exhibited: Callus, Maceration. The periwound skin appearance did not exhibit: Crepitus, Excoriation, Induration, Rash, Scarring, Dry/Scaly, Atrophie Blanche, Cyanosis, Ecchymosis, Hemosiderin Staining, Mottled, Pallor, Rubor, Erythema. 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 There was a Excisional Skin/Subcutaneous Tissue Debridement with a total area of 0.48 sq cm performed by STONE III, Jomarie Gellis E., PA-C. With the following instrument(s): Curette to remove Viable and Non-Viable tissue/material. Material removed  includes Callus, Subcutaneous Tissue, and Slough after achieving pain control using  Lidocaine 4% Topical Solution. No specimens were taken. A time out was conducted at 13:21, prior to the start of the procedure. A Minimum amount of bleeding was controlled with Pressure. The procedure was tolerated well with a pain level of 0 throughout and a pain level of 0 following the procedure. Post Debridement Measurements: 1.3cm length x 0.6cm width x 0.3cm depth; 0.184cm^3 volume. Character of Wound/Ulcer Post Debridement is improved. Severity of Tissue Post Debridement is: Fat layer exposed. Post procedure Diagnosis Wound #4R: Same as Pre-Procedure Plan Wound Cleansing: Wound #4R Left Toe Great: Cleanse wound with mild soap and water Anesthetic (add to Medication List): Wound #4R Left Toe Great: Topical Lidocaine 4% cream applied to wound bed prior to debridement (In Clinic Only). Primary Wound Dressing: Wound #4R Left Toe Great: Silver Collagen Secondary Dressing: Wound #4R Left Toe Great: Dry Gauze Conform/Kerlix Dressing Change Frequency: Wound #4R Left Toe Great: Change dressing every other day. Follow-up Appointments: Wound #4R Left Toe Great: Return Appointment in 1 week. Off-Loading: Other: - Referral to [facility] for AFO [name], [name] ([id number]) The following medication(s) was prescribed: Bactrim DS oral 800 mg-160 mg tablet 1 1 tablet oral taken 2 times a day for 10 days starting 09/25/2018 I'm gonna recommend that we go ahead and initiate the above wound care measures for the next week. The patient is in agreement with plan. Depending on how things proceed I may be considered doing the contact cast if she brings her walker with her and uses this to get around even at home in the week between when I put it on when we see her back. The patient states that she is in agreement with doing this. Under those circumstances I would be happy to put the cast back on although  I'm not going to do it today since obviously there appears to be some infection which I'm treating. She completely understands that as well. We will see her back for reevaluation. Please see above for specific wound care orders. We will see patient for re-evaluation in 1 week(s) here in the clinic. If anything worsens or changes patient will contact our office for additional recommendations. Electronic Signature(s) Signed: 09/27/2018 1:15:36 AM By: Worthy Keeler PA-C Entered By: Worthy Keeler on 09/25/2018 13:41:08 [name] ([id number]) -------------------------------------------------------------------------------- ROS/PFSH Details Patient Name: Heather Dear A. Date of Service: 09/25/2018 12:45 PM Medical Record Number: [id number] Patient Account Number: [id number] Date of Birth/Sex: October 01, 1976 (42 y.o. F) Treating RN: Montey Hora Primary Care Provider: Priscille Kluver Other Clinician: Referring Provider: Priscille Kluver Treating Provider/Extender: STONE III, Aidan Moten Weeks in Treatment: 7 Information Obtained From Patient Wound History Constitutional Symptoms (General Health) Complaints and Symptoms: Negative for: Fever; Chills Eyes Medical History: Positive for: Cataracts; Glaucoma; Optic Neuritis Ear/Nose/Mouth/Throat Medical History: Positive for: Chronic sinus problems/congestion; Middle ear problems Respiratory Complaints and Symptoms: No Complaints or Symptoms Cardiovascular Complaints and Symptoms: No Complaints or Symptoms Endocrine Medical History: Positive for: Type II Diabetes Time with diabetes: 10 years Treated with: Insulin, Oral agents Blood sugar tested every day: No Blood sugar testing results: Lunch: 350 Psychiatric Complaints and Symptoms: No Complaints or Symptoms HBO Extended History Items Ear/Nose/Mouth/Throat: Eyes: Eyes: Ear/Nose/Mouth/Throat: Chronic sinus Cataracts Glaucoma Middle ear problems problems/congestion Nop, Avri A.  ([id number]) Implantable Devices Family and Social History Current every day smoker Physician Affirmation I have reviewed and agree with the above information. Electronic Signature(s) Signed: 09/25/2018 5:21:38 PM By: Montey Hora Signed: 09/27/2018 1:15:36 AM By: Worthy Keeler PA-C Entered  By: Worthy Keeler on 09/25/2018 13:39:47 Dusza, Beyla AMarland Kitchen ([id number]) -------------------------------------------------------------------------------- [facility] Details Patient Name: Heather Dear A. Date of Service: 09/25/2018 Medical Record Number: [id number] Patient Account Number: [id number] Date of Birth/Sex: 03/09/1976 (42 y.o. 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, left foot Facility Procedures CPT4 Code: 21747159 Description: 53967 - DEB SUBQ TISSUE 20 SQ CM/< ICD-10 Diagnosis Description L97.522 Non-pressure chronic ulcer of other part of left foot with fat Modifier: layer exposed Quantity: 1 Physician Procedures CPT4 Code: 2897915 Description: 04136 - WC PHYS LEVEL 4 - EST PT ICD-10 Diagnosis Description E11.621 Type 2 diabetes mellitus with foot ulcer L97.522 Non-pressure chronic ulcer of other part of left foot with fat 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

## 2018-09-29 NOTE — Progress Notes (Signed)
KATARA, GRINER ([id number]) Visit Report for 09/25/2018 Arrival Information Details Patient Name: GIANELLA, CHISMAR A. Date of Service: 09/25/2018 12:45 PM Medical Record Number: [id number] Patient Account Number: [id number] Date of Birth/Sex: Sep 08, 1976 (42 y.o. F) Treating RN: Secundino Ginger Primary Care Resha Filippone: Priscille Kluver Other Clinician: Referring Machelle Raybon: Priscille Kluver Treating Katasha Riga/Extender: Melburn Hake, HOYT Weeks in Treatment: 7 Visit Information History Since Last Visit Added or deleted any medications: No Patient Arrived: Wheel Chair Any new allergies or adverse reactions: No Arrival Time: 12:51 Had a fall or experienced change in No Accompanied By: family activities of daily living that may affect Transfer Assistance: None risk of falls: Patient Identification Verified: Yes Signs or symptoms of abuse/neglect since last visito No Secondary Verification Process Completed: Yes Hospitalized since last visit: No Implantable device outside of the clinic excluding No cellular tissue based products placed in the center since last visit: Has Dressing in Place as Prescribed: Yes Pain Present Now: No Electronic Signature(s) Signed: 09/25/2018 4:16:42 PM By: Secundino Ginger Entered By: Secundino Ginger on 09/25/2018 12:57:09 [name] ([id number]) -------------------------------------------------------------------------------- Encounter Discharge Information Details Patient Name: [name] Date of Service: 09/25/2018 12:45 PM Medical Record Number: [id number] Patient Account Number: [id number] Date of Birth/Sex: 09/16/1976 (42 y.o. F) Treating RN: Harold Barban Primary Care Drusilla Wampole: Priscille Kluver Other Clinician: Referring Lemonte Al: Priscille Kluver Treating Erma Raiche/Extender: Melburn Hake, HOYT Weeks in Treatment: 7 Encounter Discharge Information Items Post Procedure Vitals Discharge Condition: Stable Temperature (F): 98.1 Ambulatory Status: Wheelchair Pulse (bpm):  105 Discharge Destination: Home Respiratory Rate (breaths/min): 18 Transportation: Private Auto Blood Pressure (mmHg): 120/86 Accompanied By: friend Schedule Follow-up Appointment: Yes Clinical Summary of Care: Electronic Signature(s) Signed: 09/25/2018 5:26:41 PM By: Harold Barban Entered By: Harold Barban on 09/25/2018 13:36:54 Ewell, Renelle AMarland Kitchen ([id number]) -------------------------------------------------------------------------------- Lower Extremity Assessment Details Patient Name: [name] Date of Service: 09/25/2018 12:45 PM Medical Record Number: [id number] Patient Account Number: [id number] Date of Birth/Sex: 1976-04-26 (42 y.o. F) Treating RN: Secundino Ginger Primary Care Faiz Weber: Priscille Kluver Other Clinician: Referring Laelah Siravo: Priscille Kluver Treating Annslee Tercero/Extender: STONE III, HOYT Weeks in Treatment: 7 Edema Assessment Assessed: [Left: No] [Right: No] [Left: Edema] [Right: :] Calf Left: Right: Point of Measurement: 36 cm From Medial Instep 33 cm cm Ankle Left: Right: Point of Measurement: 12 cm From Medial Instep 18.5 cm cm Vascular Assessment Pulses: Dorsalis Pedis Palpable: [Left:Yes] Posterior Tibial Extremity colors, hair growth, and conditions: Extremity Color: [Left:Normal] Hair Growth on Extremity: [Left:Yes] Temperature of Extremity: [Left:Warm] Capillary Refill: [Left:< 3 seconds] Toe Nail Assessment Left: Right: Thick: No Discolored: No Deformed: No Improper Length and Hygiene: No Electronic Signature(s) Signed: 09/25/2018 4:16:42 PM By: Secundino Ginger Entered By: Secundino Ginger on 09/25/2018 13:04:33 Schobert, Comal ([id number]) -------------------------------------------------------------------------------- Multi Wound Chart Details Patient Name: [name] Date of Service: 09/25/2018 12:45 PM Medical Record Number: [id number] Patient Account Number: [id number] Date of Birth/Sex: 21-Aug-1976 (42 y.o. F) Treating RN: Montey Hora Primary Care Tyreese Thain: Priscille Kluver Other Clinician: Referring Sianni Cloninger: Priscille Kluver Treating Rhyen Mazariego/Extender: STONE III, HOYT Weeks in Treatment: 7 Vital Signs Height(in): 69 Pulse(bpm): 105 Weight(lbs): 240 Blood Pressure(mmHg): 120/86 Body Mass Index(BMI): 35 Temperature(F): 98.1 Respiratory Rate 18 (breaths/min): Photos: [4R:No Photos] [N/A:N/A] Wound Location: [4R:Left Toe Great] [N/A:N/A] Wounding Event: [4R:Gradually Appeared] [N/A:N/A] Primary Etiology: [4R:Diabetic Wound/Ulcer of the N/A Lower Extremity] Comorbid History: [4R:Cataracts, Glaucoma, Optic Neuritis, Chronic sinus problems/congestion, Middle ear problems, Type II Diabetes] [N/A:N/A] Date Acquired: [4R:07/24/2018] [N/A:N/A] Weeks of Treatment: [4R:7] [N/A:N/A] Wound Status: [4R:Open] [N/A:N/A] Wound Recurrence: [4R:Yes] [N/A:N/A]  Pending Amputation on [4R:Yes] [N/A:N/A] Presentation: Measurements L x W x D [4R:1.2x0.4x0.4] [N/A:N/A] (cm) Area (cm) : [4R:0.377] [N/A:N/A] Volume (cm) : [4R:0.151] [N/A:N/A] % Reduction in Area: [4R:75.80%] [N/A:N/A] % Reduction in Volume: [4R:3.20%] [N/A:N/A] Starting Position 1 [4R:1] (o'clock): Ending Position 1 [4R:12] (o'clock): Maximum Distance 1 (cm): [4R:0.2] Undermining: [4R:Yes] [N/A:N/A] Classification: [4R:Grade 2] [N/A:N/A] Exudate Amount: [4R:Small] [N/A:N/A] Exudate Type: [4R:Serous] [N/A:N/A] Exudate Color: [4R:amber] [N/A:N/A] Wound Margin: [4R:Thickened] [N/A:N/A] Granulation Amount: [4R:None Present (0%)] [N/A:N/A] Necrotic Amount: [4R:None Present (0%)] [N/A:N/A] Exposed Structures: [4R:Fat Layer (Subcutaneous Tissue) Exposed: Yes Fascia: No] [N/A:N/A] Tendon: No Muscle: No Joint: No Bone: No Epithelialization: Large (67-100%) N/A N/A Periwound Skin Texture: Callus: Yes N/A N/A Excoriation: No Induration: No Crepitus: No Rash: No Scarring: No Periwound Skin Moisture: Maceration: Yes N/A N/A Dry/Scaly: No Periwound Skin  Color: Atrophie Blanche: No N/A N/A Cyanosis: No Ecchymosis: No Erythema: No Hemosiderin Staining: No Mottled: No Pallor: No Rubor: No Tenderness on Palpation: No N/A N/A Wound Preparation: Ulcer Cleansing: N/A N/A Rinsed/Irrigated with Saline Topical Anesthetic Applied: Other: lidocaine 4% Treatment Notes Electronic Signature(s) Signed: 09/25/2018 5:21:38 PM By: Montey Hora Entered By: Montey Hora on 09/25/2018 13:16:39 [name] ([id number]) -------------------------------------------------------------------------------- [place] Details Patient Name: [name] Date of Service: 09/25/2018 12:45 PM Medical Record Number: [id number] Patient Account Number: [id number] Date of Birth/Sex: 06/07/1976 (42 y.o. F) Treating RN: Montey Hora Primary Care Breaunna Gottlieb: Priscille Kluver Other Clinician: Referring Michelene Keniston: Priscille Kluver Treating Neilah Fulwider/Extender: Melburn Hake, HOYT Weeks in Treatment: 7 Active Inactive Abuse / Safety / Falls / Self Care Management Nursing Diagnoses: History of Falls Goals: Patient will not experience any injury related to falls Date Initiated: 08/07/2018 Target Resolution Date: 10/19/2018 Goal Status: Active Interventions: Assess fall risk on admission and as needed Notes: Orientation to the Wound Care Program Nursing Diagnoses: Knowledge deficit related to the wound healing center program Goals: Patient/caregiver will verbalize understanding of the [place] Date Initiated: 08/07/2018 Target Resolution Date: 10/19/2018 Goal Status: Active Interventions: Provide education on orientation to the wound center Notes: Soft Tissue Infection Nursing Diagnoses: Impaired tissue integrity Goals: Patient will remain free of wound infection Date Initiated: 08/07/2018 Target Resolution Date: 10/19/2018 Goal Status: Active Interventions: Assess signs and symptoms of infection every visit Kohrs,  Walta A. ([id number]) Notes: Wound/Skin Impairment Nursing Diagnoses: Impaired tissue integrity Goals: Ulcer/skin breakdown will heal within 14 weeks Date Initiated: 08/07/2018 Target Resolution Date: 10/19/2018 Goal Status: Active Interventions: Assess patient/caregiver ability to obtain necessary supplies Notes: Electronic Signature(s) Signed: 09/25/2018 5:21:38 PM By: Montey Hora Entered By: Montey Hora on 09/25/2018 13:16:29 Pulliam, Naliya A. ([id number]) -------------------------------------------------------------------------------- Pain Assessment Details Patient Name: [name] Date of Service: 09/25/2018 12:45 PM Medical Record Number: [id number] Patient Account Number: [id number] Date of Birth/Sex: 03-16-1976 (42 y.o. F) Treating RN: Secundino Ginger Primary Care Eryka Dolinger: Priscille Kluver Other Clinician: Referring Emmaleah Meroney: Priscille Kluver Treating Joyleen Haselton/Extender: STONE III, HOYT Weeks in Treatment: 7 Active Problems Location of Pain Severity and Description of Pain Patient Has Paino No Site Locations Pain Management and Medication Current Pain Management: Goals for Pain Management pt denies any pain at this time. Electronic Signature(s) Signed: 09/25/2018 4:16:42 PM By: Secundino Ginger Entered By: Secundino Ginger on 09/25/2018 12:57:28 [name] ([id number]) -------------------------------------------------------------------------------- Patient/Caregiver Education Details Patient Name: [name] Date of Service: 09/25/2018 12:45 PM Medical Record Number: [id number] Patient Account Number: [id number] Date of Birth/Gender: 17-Jan-1976 (42 y.o. F) Treating RN: Montey Hora Primary Care Physician: Priscille Kluver Other Clinician: Referring Physician: DAVIS,  DEBRA Treating Physician/Extender: Melburn Hake, HOYT Weeks in Treatment: 7 Education Assessment Education Provided To: Patient and Caregiver Education Topics Provided Wound/Skin  Impairment: Handouts: Other: wound care as ordered Methods: Demonstration, Explain/Verbal Responses: State content correctly Electronic Signature(s) Signed: 09/25/2018 5:26:41 PM By: Harold Barban Entered By: Harold Barban on 09/25/2018 13:37:02 Lindsay, [place] ([id number]) -------------------------------------------------------------------------------- Wound Assessment Details Patient Name: [name] Date of Service: 09/25/2018 12:45 PM Medical Record Number: [id number] Patient Account Number: [id number] Date of Birth/Sex: Jan 18, 1976 (42 y.o. F) Treating RN: Secundino Ginger Primary Care Dartha Rozzell: Priscille Kluver Other Clinician: Referring Ahjanae Cassel: Priscille Kluver Treating Rossie Scarfone/Extender: STONE III, HOYT Weeks in Treatment: 7 Wound Status Wound Number: 4R Primary Diabetic Wound/Ulcer of the Lower Extremity Etiology: Wound Location: Left Toe Great Wound Open Wounding Event: Gradually Appeared Status: Date Acquired: 07/24/2018 Comorbid Cataracts, Glaucoma, Optic Neuritis, Chronic Weeks Of Treatment: 7 History: sinus problems/congestion, Middle ear problems, Clustered Wound: No Type II Diabetes Pending Amputation On Presentation Photos Photo Uploaded By: Secundino Ginger on 09/25/2018 14:42:28 Wound Measurements Length: (cm) 1.2 % Reductio Width: (cm) 0.4 % Reductio Depth: (cm) 0.4 Epithelial Area: (cm) 0.377 Tunneling Volume: (cm) 0.151 Undermini Startin Ending Maximum n in Area: 75.8% n in Volume: 3.2% ization: Large (67-100%) : No ng: Yes g Position (o'clock): 1 Position (o'clock): 12 Distance: (cm) 0.2 Wound Description Classification: Grade 2 Foul Odor Wound Margin: Thickened Slough/Fi Exudate Amount: Small Exudate Type: Serous Exudate Color: amber After Cleansing: No brino No Wound Bed Granulation Amount: None Present (0%) Exposed Structure Necrotic Amount: None Present (0%) Fascia Exposed: No Fat Layer (Subcutaneous Tissue) Exposed: Yes Tendon  Exposed: No Korell, Claudell A. ([id number]) Muscle Exposed: No Joint Exposed: No Bone Exposed: No Periwound Skin Texture Texture Color No Abnormalities Noted: No No Abnormalities Noted: No Callus: Yes Atrophie Blanche: No Crepitus: No Cyanosis: No Excoriation: No Ecchymosis: No Induration: No Erythema: No Rash: No Hemosiderin Staining: No Scarring: No Mottled: No Pallor: No Moisture Rubor: No No Abnormalities Noted: No Dry / Scaly: No Maceration: Yes Wound Preparation Ulcer Cleansing: Rinsed/Irrigated with Saline Topical Anesthetic Applied: Other: lidocaine 4%, Treatment Notes Wound #4R (Left Toe Great) Notes Silver collagen, dry gauze, conform. Electronic Signature(s) Signed: 09/25/2018 4:16:42 PM By: Secundino Ginger Entered By: Secundino Ginger on 09/25/2018 13:03:05 [name] ([id number]) -------------------------------------------------------------------------------- [place] Details Patient Name: [name] Date of Service: 09/25/2018 12:45 PM Medical Record Number: [id number] Patient Account Number: [id number] Date of Birth/Sex: 16-Nov-1975 (42 y.o. F) Treating RN: Secundino Ginger Primary Care Vaani Morren: Priscille Kluver Other Clinician: Referring Rannie Craney: Priscille Kluver Treating Joclyn Alsobrook/Extender: STONE III, HOYT Weeks in Treatment: 7 Vital Signs Time Taken: 12:57 Temperature (F): 98.1 Height (in): 69 Pulse (bpm): 105 Weight (lbs): 240 Respiratory Rate (breaths/min): 18 Body Mass Index (BMI): 35.4 Blood Pressure (mmHg): 120/86 Reference Range: 80 - 120 mg / dl Electronic Signature(s) Signed: 09/25/2018 4:16:42 PM By: Secundino Ginger Entered BySecundino Ginger on 09/25/2018 12:57:56

## 2018-10-01 ENCOUNTER — Telehealth: Payer: Self-pay | Admitting: *Deleted

## 2018-10-01 DIAGNOSIS — M79672 Pain in left foot: Principal | ICD-10-CM

## 2018-10-01 DIAGNOSIS — G629 Polyneuropathy, unspecified: Secondary | ICD-10-CM

## 2018-10-01 DIAGNOSIS — M21372 Foot drop, left foot: Secondary | ICD-10-CM

## 2018-10-01 DIAGNOSIS — M79671 Pain in right foot: Secondary | ICD-10-CM

## 2018-10-01 NOTE — Telephone Encounter (Signed)
Faxed referral, clinicals and demographics to [facility].

## 2018-10-01 NOTE — Telephone Encounter (Signed)
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

## 2018-10-02 ENCOUNTER — Encounter: Payer: Medicaid Other | Admitting: Physician Assistant

## 2018-10-02 DIAGNOSIS — E11621 Type 2 diabetes mellitus with foot ulcer: Secondary | ICD-10-CM | POA: Diagnosis not present

## 2018-10-04 ENCOUNTER — Other Ambulatory Visit: Payer: Self-pay | Admitting: Sports Medicine

## 2018-10-04 DIAGNOSIS — L97522 Non-pressure chronic ulcer of other part of left foot with fat layer exposed: Secondary | ICD-10-CM

## 2018-10-04 DIAGNOSIS — G629 Polyneuropathy, unspecified: Secondary | ICD-10-CM

## 2018-10-04 DIAGNOSIS — M79672 Pain in left foot: Principal | ICD-10-CM

## 2018-10-04 DIAGNOSIS — M79671 Pain in right foot: Secondary | ICD-10-CM

## 2018-10-07 NOTE — Progress Notes (Signed)
[name], [name] ([id number]) Visit Report for 10/02/2018 Arrival Information Details Patient Name: [name], [name] Date of Service: 10/02/2018 11:00 AM Medical Record Number: [id number] Patient Account Number: [id number] Date of Birth/Sex: 02-Sep-1976 (42 y.o. F) Treating RN: Cornell Barman Primary Care Kamrin Spath: Priscille Kluver Other Clinician: Referring Apostolos Blagg: Priscille Kluver Treating Chalisa Kobler/Extender: Melburn Hake, HOYT Weeks in Treatment: 8 Visit Information History Since Last Visit Added or deleted any medications: No Patient Arrived: Wheel Chair Any new allergies or adverse reactions: No Arrival Time: 11:21 Had a fall or experienced change in No activities of daily living that may affect Accompanied By: mom risk of falls: Transfer Assistance: Manual Signs or symptoms of abuse/neglect since last visito No Patient Identification Verified: Yes Hospitalized since last visit: No Secondary Verification Process Completed: Yes Implantable device outside of the clinic excluding No Patient Requires Transmission-Based No cellular tissue based products placed in the center Precautions: since last visit: Patient Has Alerts: No Has Dressing in Place as Prescribed: Yes Pain Present Now: No Electronic Signature(s) Signed: 10/03/2018 5:09:03 PM By: Gretta Cool, BSN, RN, CWS, [name] RN, BSN Entered By: Gretta Cool, BSN, RN, CWS, [name] on 10/02/2018 11:21:47 [name] ([id number]) -------------------------------------------------------------------------------- Encounter Discharge Information Details Patient Name: Heather Dear A. Date of Service: 10/02/2018 11:00 AM Medical Record Number: [id number] Patient Account Number: [id number] Date of Birth/Sex: Mar 03, 1976 (42 y.o. F) Treating RN: Montey Hora Primary Care Janise Gora: Priscille Kluver Other Clinician: Referring Travoris Bushey: Priscille Kluver Treating Zaidan Keeble/Extender: Melburn Hake, HOYT Weeks in Treatment: 8 Encounter Discharge Information Items Post  Procedure Vitals Discharge Condition: Stable Temperature (F): 98.3 Ambulatory Status: Wheelchair Pulse (bpm): 96 Discharge Destination: Home Respiratory Rate (breaths/min): 16 Transportation: Private Auto Blood Pressure (mmHg): 119/84 Accompanied By: mother Schedule Follow-up Appointment: Yes Clinical Summary of Care: Electronic Signature(s) Signed: 10/02/2018 5:28:58 PM By: Montey Hora Entered By: Montey Hora on 10/02/2018 11:48:41 [name], [name] Kitchen ([id number]) -------------------------------------------------------------------------------- Lower Extremity Assessment Details Patient Name: Heather Dear A. Date of Service: 10/02/2018 11:00 AM Medical Record Number: [id number] Patient Account Number: [id number] Date of Birth/Sex: 01/08/1976 (42 y.o. F) Treating RN: Cornell Barman Primary Care Jamaris Theard: Priscille Kluver Other Clinician: Referring Sierrah Luevano: Priscille Kluver Treating Osceola Depaz/Extender: Melburn Hake, HOYT Weeks in Treatment: 8 Edema Assessment Assessed: [Left: No] [Right: No] Edema: [Left: N] [Right: o] Vascular Assessment Pulses: Dorsalis Pedis Palpable: [Left:Yes] Posterior Tibial Extremity colors, hair growth, and conditions: Extremity Color: [Left:Normal] Hair Growth on Extremity: [Left:Yes] Temperature of Extremity: [Left:Warm] Capillary Refill: [Left:< 3 seconds] Toe Nail Assessment Left: Right: Thick: No Discolored: No Deformed: No Improper Length and Hygiene: No Electronic Signature(s) Signed: 10/03/2018 5:09:03 PM By: Gretta Cool, BSN, RN, CWS, [name] RN, BSN Entered By: Gretta Cool, BSN, RN, CWS, [name] on 10/02/2018 11:27:10 [name] ([id number]) -------------------------------------------------------------------------------- Multi Wound Chart Details Patient Name: Heather Dear A. Date of Service: 10/02/2018 11:00 AM Medical Record Number: [id number] Patient Account Number: [id number] Date of Birth/Sex: 11-15-1975 (42 y.o. 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] Comorbid History: [4R:Cataracts, Glaucoma, Optic Neuritis, Chronic sinus problems/congestion, Middle ear problems, Type II Diabetes] [N/A:N/A] Date Acquired: [4R:07/24/2018] [N/A:N/A] Weeks of Treatment: [4R:8] [N/A:N/A] Wound Status: [4R:Open] [N/A:N/A] Wound Recurrence: [4R:Yes] [N/A:N/A] Pending Amputation  on [4R:Yes] [N/A:N/A] Presentation: Measurements L x W x D [4R:1.2x0.7x0.4] [N/A:N/A] (cm) Area (cm) : [4R:0.66] [N/A:N/A] Volume (cm) : [4R:0.264] [N/A:N/A] % Reduction in Area: [4R:57.60%] [N/A:N/A] % Reduction in Volume: [4R:-69.20%] [N/A:N/A] Classification: [4R:Grade 2] [N/A:N/A] Exudate Amount: [4R:Small] [N/A:N/A] Exudate Type: [4R:Serous] [N/A:N/A] Exudate Color: [4R:amber] [N/A:N/A] Wound Margin: [4R:Thickened] [N/A:N/A] Granulation Amount: [4R:Medium (34-66%)] [N/A:N/A] Granulation Quality: [4R:Pink] [N/A:N/A] Necrotic Amount: [4R:Medium (34-66%)] [N/A:N/A] Exposed Structures: [4R:Fat Layer (Subcutaneous Tissue) Exposed: Yes Fascia: No Tendon: No Muscle: No Joint: No Bone: No] [N/A:N/A] Epithelialization: [4R:Large (67-100%)] [N/A:N/A] Periwound Skin Texture: Callus: Yes N/A N/A Excoriation: No Induration: No Crepitus: No Rash: No Scarring: No Periwound Skin Moisture: Maceration: Yes N/A N/A Dry/Scaly: No Periwound Skin Color: Atrophie Blanche: No N/A N/A Cyanosis: No Ecchymosis: No Erythema: No Hemosiderin Staining:  No Mottled: No Pallor: No Rubor: No Tenderness on Palpation: No N/A N/A Wound Preparation: Ulcer Cleansing: N/A N/A Rinsed/Irrigated with Saline Topical Anesthetic Applied: Other: lidocaine 4% Treatment Notes Electronic Signature(s) Signed: 10/02/2018 5:28:58 PM By: Montey Hora Entered By: Montey Hora on 10/02/2018 11:43:26 [name] ([id number]) -------------------------------------------------------------------------------- [place] Details Patient Name: Heather Dear A. Date of Service: 10/02/2018 11:00 AM Medical Record Number: [id number] Patient Account Number: [id number] Date of Birth/Sex: 01/19/1976 (42 y.o. F) Treating RN: Montey Hora Primary Care Kayce Betty: Priscille Kluver Other Clinician: Referring Nancylee Gaines: Priscille Kluver Treating Kasiya Burck/Extender: Melburn Hake, HOYT Weeks in Treatment: 8 Active Inactive Abuse / Safety / Falls / Self Care Management Nursing Diagnoses: History of Falls Goals: Patient will not experience any injury related to falls Date Initiated: 08/07/2018 Target Resolution Date: 10/19/2018 Goal Status: Active Interventions: Assess fall risk on admission and as needed Notes: Orientation to the Wound Care Program Nursing Diagnoses: Knowledge deficit related to the wound healing center program Goals: Patient/caregiver will verbalize understanding of the [place] Date Initiated: 08/07/2018 Target Resolution Date: 10/19/2018 Goal Status: Active Interventions: Provide education on orientation to the wound center Notes: Soft Tissue Infection Nursing Diagnoses: Impaired tissue integrity Goals: Patient will remain free of wound infection Date Initiated: 08/07/2018 Target Resolution Date: 10/19/2018 Goal Status: Active Interventions: Assess signs and symptoms of infection every visit [name], [name] ([id number]) Notes: Wound/Skin Impairment Nursing Diagnoses: Impaired tissue  integrity Goals: Ulcer/skin breakdown will heal within 14 weeks Date Initiated: 08/07/2018 Target Resolution Date: 10/19/2018 Goal Status: Active Interventions: Assess patient/caregiver ability to obtain necessary supplies Notes: Electronic Signature(s) Signed: 10/02/2018 5:28:58 PM By: Montey Hora Entered By: Montey Hora on 10/02/2018 11:43:19 [name], [name] ([id number]) -------------------------------------------------------------------------------- Pain Assessment Details Patient Name: Heather Dear A. Date of Service: 10/02/2018 11:00 AM Medical Record Number: [id number] Patient Account Number: [id number] Date of Birth/Sex: 1976-05-02 (42 y.o. F) Treating RN: Cornell Barman Primary Care Canon Gola: Priscille Kluver Other Clinician: Referring Maida Widger: Priscille Kluver Treating Kohan Azizi/Extender: Melburn Hake, HOYT Weeks in Treatment: 8 Active Problems Location of Pain Severity and Description of Pain Patient Has Paino No Site Locations Pain Management and Medication Current Pain Management: Electronic Signature(s) Signed: 10/03/2018 5:09:03 PM By: Gretta Cool, BSN, RN, CWS, [name] RN, BSN Entered By: Gretta Cool, BSN, RN, CWS, [name] on 10/02/2018 11:22:21 [name] ([id number]) -------------------------------------------------------------------------------- Patient/Caregiver Education Details Patient Name: Heather Dear A. Date of Service: 10/02/2018 11:00 AM Medical Record Number: [id number] Patient Account Number: [id number] Date of Birth/Gender: 05-06-1976 (42 y.o. F) Treating RN: Montey Hora Primary Care Physician: Priscille Kluver Other Clinician: Referring Physician: Priscille Kluver Treating Physician/Extender: Sharalyn Ink in Treatment: 8 Education Assessment Education Provided To: Patient and Caregiver Education Topics Provided Wound/Skin  Impairment: Handouts: Other: wound care as ordered Methods: Demonstration, Explain/Verbal Responses: State content  correctly Electronic Signature(s) Signed: 10/02/2018 5:28:58 PM By: Montey Hora Entered By: Montey Hora on 10/02/2018 11:47:08 [name], [name] ([id number]) -------------------------------------------------------------------------------- Wound Assessment Details Patient Name: Heather Dear A. Date of Service: 10/02/2018 11:00 AM Medical Record Number: [id number] Patient Account Number: [id number] Date of Birth/Sex: 1976/02/18 (42 y.o. F) Treating RN: Cornell Barman Primary Care Prudie Guthridge: Priscille Kluver Other Clinician: Referring Anjelo Pullman: Priscille Kluver Treating Bright Spielmann/Extender: STONE III, HOYT Weeks in Treatment: 8 Wound Status Wound Number: 4R Primary Diabetic Wound/Ulcer of the Lower Extremity Etiology: Wound Location: Left Toe Great Wound Open Wounding Event: Gradually Appeared Status: Date Acquired: 07/24/2018 Comorbid Cataracts, Glaucoma, Optic Neuritis, Chronic Weeks Of Treatment: 8 History: sinus problems/congestion, Middle ear problems, Clustered Wound: No Type II Diabetes Pending Amputation On Presentation Photos Photo Uploaded By: Gretta Cool, BSN, RN, CWS, [name] on 10/03/2018 08:29:55 Wound Measurements Length: (cm) 1.2 Width: (cm) 0.7 Depth: (cm) 0.4 Area: (cm) 0.66 Volume: (cm) 0.264 % Reduction in Area: 57.6% % Reduction in Volume: -69.2% Epithelialization: Large (67-100%) Tunneling: No Undermining: No Wound Description Classification: Grade 2 Foul Odor Wound Margin: Thickened Slough/Fi Exudate Amount: Small Exudate Type: Serous Exudate Color: amber After Cleansing: No brino No Wound Bed Granulation Amount: Medium (34-66%) Exposed Structure Granulation Quality: Pink Fascia Exposed: No Necrotic Amount: Medium (34-66%) Fat Layer (Subcutaneous Tissue) Exposed: Yes Necrotic Quality: Adherent Slough Tendon Exposed: No Muscle Exposed: No Joint Exposed: No Bone Exposed: No Periwound Skin Texture [name], [name] ([id number]) Texture Color No  Abnormalities Noted: No No Abnormalities Noted: No Callus: Yes Atrophie Blanche: No Crepitus: No Cyanosis: No Excoriation: No Ecchymosis: No Induration: No Erythema: No Rash: No Hemosiderin Staining: No Scarring: No Mottled: No Pallor: No Moisture Rubor: No No Abnormalities Noted: No Dry / Scaly: No Maceration: Yes Wound Preparation Ulcer Cleansing: Rinsed/Irrigated with Saline Topical Anesthetic Applied: Other: lidocaine 4%, Treatment Notes Wound #4R (Left Toe Great) Notes silvercel, dry gauze, conform. Electronic Signature(s) Signed: 10/03/2018 5:09:03 PM By: Gretta Cool, BSN, RN, CWS, [name] RN, BSN Entered By: Gretta Cool, BSN, RN, CWS, [name] on 10/02/2018 11:24:47 [name] ([id number]) -------------------------------------------------------------------------------- Vitals Details Patient Name: Heather Dear A. Date of Service: 10/02/2018 11:00 AM Medical Record Number: [id number] Patient Account Number: [id number] Date of Birth/Sex: 1976/04/05 (42 y.o. F) Treating RN: Cornell Barman Primary Care Kalena Mander: Priscille Kluver Other Clinician: Referring Zamir Staples: Priscille Kluver Treating Anihya Tuma/Extender: Melburn Hake, HOYT Weeks in Treatment: 8 Vital Signs Time Taken: 11:22 Temperature (F): 98.3 Height (in): 69 Pulse (bpm): 96 Weight (lbs): 240 Respiratory Rate (breaths/min): 16 Body Mass Index (BMI): 35.4 Blood Pressure (mmHg): 119/84 Reference Range: 80 - 120 mg / dl Electronic Signature(s) Signed: 10/03/2018 5:09:03 PM By: Gretta Cool, BSN, RN, CWS, [name] RN, BSN Entered By: Gretta Cool, BSN, RN, CWS, [name] on 10/02/2018 11:23:06

## 2018-10-07 NOTE — Progress Notes (Signed)
[name], [name] ([id number]) Visit Report for 10/02/2018 Chief Complaint Document Details Patient Name: [name], [name] Date of Service: 10/02/2018 11:00 AM Medical Record Number: [id number] Patient Account Number: [id number] Date of Birth/Sex: 1976/01/19 (42 y.o. F) Treating RN: Montey Hora Primary Care Provider: Priscille Kluver Other Clinician: Referring Provider: Priscille Kluver Treating Provider/Extender: Melburn Hake, HOYT Weeks in Treatment: 8 Information Obtained from: Patient Chief Complaint She is here for evaluation of a left great toe ulcer Electronic Signature(s) Signed: 10/05/2018 8:01:46 PM By: Worthy Keeler PA-C Entered By: Worthy Keeler on 10/02/2018 11:13:57 [name] ([id number]) -------------------------------------------------------------------------------- Debridement Details Patient Name: Heather Dear A. Date of Service: 10/02/2018 11:00 AM Medical Record Number: [id number] Patient Account Number: [id number] Date of Birth/Sex: 18-Jan-1976 (42 y.o. F) Treating RN: Montey Hora Primary Care Provider: Priscille Kluver Other Clinician: Referring Provider: Priscille Kluver Treating Provider/Extender: STONE III, HOYT Weeks in Treatment: 8 Debridement Performed for Wound #4R Left Toe Great Assessment: Performed By: Physician STONE III, HOYT E., PA-C Debridement Type: Debridement Severity of Tissue Pre Fat layer exposed Debridement: Level of Consciousness (Pre- Awake and Alert procedure): Pre-procedure Verification/Time Yes - 11:42 Out Taken: Start Time: 11:42 Pain Control: Lidocaine 4% Topical Solution Total Area Debrided (L x W): 1.2 (cm) x 0.7 (cm) = 0.84 (cm) Tissue and other material Non-Viable, Callus debrided: Level: Non-Viable Tissue Debridement Description: Selective/Open Wound Instrument: Curette Bleeding: Minimum Hemostasis Achieved: Pressure End Time: 11:45 Procedural Pain: 0 Post Procedural Pain: 0 Response to Treatment: Procedure  was tolerated well Level of Consciousness Awake and Alert (Post-procedure): Post Debridement Measurements of Total Wound Length: (cm) 1.2 Width: (cm) 0.7 Depth: (cm) 0.3 Volume: (cm) 0.198 Character of Wound/Ulcer Post Debridement: Improved Severity of Tissue Post Debridement: Fat layer exposed Post Procedure Diagnosis Same as Pre-procedure Electronic Signature(s) Signed: 10/02/2018 5:28:58 PM By: Montey Hora Signed: 10/05/2018 8:01:46 PM By: Worthy Keeler PA-C Entered By: Montey Hora on 10/02/2018 11:45:47 [name], [name] ([id number]) -------------------------------------------------------------------------------- HPI Details Patient Name: Heather Dear A. Date of Service: 10/02/2018 11:00 AM Medical Record Number: [id number] Patient Account Number: [id number] Date of Birth/Sex: [birth year] (42 y.o. F) Treating RN: Montey Hora Primary Care Provider: Priscille Kluver Other Clinician: Referring Provider: Priscille Kluver Treating Provider/Extender: Melburn Hake, HOYT Weeks in Treatment: 8 History of Present Illness HPI Description: 01/18/18-She is here for initial evaluation of the left great toe ulcer. She is a poor historian in regards to timeframe in detail. She states approximately 4 weeks ago she lacerated her toe on something in the house. She followed up with her primary care who placed her on Bactrim and ultimately a second dose of Bactrim prior to coming to wound clinic. She states she has been treating the toe with peroxide, Betadine and a Band-Aid. She did not check her blood sugar this morning but checked it yesterday morning it was 327; she is unaware of a recent A1c and there are no current records. She saw Dr. she would've orthopedics last week for an old injury to the left ankle, she states he did not see her toe, nor did she bring it to his attention. She smokes approximately 1 pack cigarettes a day. Her social situation is concerning, she arrives this  morning with her mother who appears extremely intoxicated/under the influence; her mother was asked to leave the room and be monitored by the patient's grandmother. The patient's aunt then accompanied the patient and the room throughout the rest of the appointment. We had a lengthy discussion regarding the deleterious effects of  uncontrolled hyperglycemia and smoking as it relates to wound healing and overall health. She was strongly encouraged to decrease her smoking and get her diabetes under better control. She states she is currently on a diet and has cut down her [facility] consumption. The left toe is erythematous, macerated and slightly edematous with malodor present. The edema in her left foot is below her baseline, there is no erythema streaking. We will treat her with Santyl, doxycycline; we have ordered and xray, culture and provided a Peg assist surgical shoe and cultured the wound. 01/25/18-She is here in follow-up evaluation for a left great toe ulcer and presents with an abscess to her suprapubic area. She states her blood sugars remain elevated, feeling "sick" and if levels are below 250, but she is trying. She has made no attempt to decrease her smoking stating that we "can't take away her food in her cigarettes". She has been compliant with offloading using the PEG assist you. She is using Santyl daily. the culture obtained last week grew staph aureus and Enterococcus faecalis; continues on the doxycycline and Augmentin was added on Monday. The suprapubic area has erythema, no femoral variation, purple discoloration, minimal induration, was accessed with a cotton tip applicator with sanguinopurulent drainage, this was cultured, I suspect the current antibiotic treatment will cover and we will not add anything to her current treatment plan. She was advised to go to urgent care or ER with any change in redness, induration or fever. 02/01/18-She is here in follow-up evaluation for left  great toe ulcers and a new abdominal abscess from last week. She was able to use packing until earlier this week, where she "forgot it was there". She states she was feeling ill with GI symptoms last week and was not taking her antibiotic. She states her glucose levels have been predominantly less than 200, with occasional levels between 200-250. She thinks this was contributing to her GI symptoms as they have resolved without intervention. There continues to be significant laceration to left toe, otherwise it clinically looks stable/improved. There is now less superficial opening to the lateral aspect of the great toe that was residual blister. We will transition to [facility] to all wounds, she will continue her Augmentin. If there is no change or deterioration next week for reculture. 02/08/18-She is here in follow-up evaluation for left great toe ulcer and abdominal ulcer. There is an improvement in both wounds. She has been wrapping her left toe with coban, not by our direction, which has created an area of discoloration to the medial aspect; she has been advised to NOT use coban secondary to her neuropathy. She states her glucose levels have been high over this last week ranging from 200-350, she continues to smoke. She admits to being less compliant with her offloading shoe. We will continue with same treatment plan and she will follow-up next week. 02/15/18-She is here in follow-up evaluation for left great toe ulcer and abdominal ulcer. The abdominal ulcer is epithelialized. The left great toe ulcer is improved and all injury from last week using the Coban wrap is resolved, the lateral ulcer is healed. She admits to noncompliance with wearing offloading shoe and admits to glucose levels being greater than 300 most of the week. She continues to smoke and expresses no desire to quit. There is one area medially that probes deeper than it has historically, erythema to the toe and dorsal foot  has consistently waxed and waned. There is no overt signs of cellulitis  or infection but we will culture the wound for any occult infection given the new area of depth and erythema. We will hold off on sensitivities for initiation of antibiotic therapy. 02/22/18-She is here in follow up evaluation for left great toe ulcer. There is overall significant improvement in both wound appearance, erythema and edema with changes made last week. She was not initiated on antibiotic therapy. Culture obtained last week showed oxacillin sensitive staph aureus, sensitive to clindamycin. Clindamycin has been called into the pharmacy but she has been instructed to hold off on initiation secondary to overall clinical improvement and her history of antibiotic intolerance. She has been instructed to contact the clinic with any noted changes/deterioration and the wound, erythema, [name], [name] ([id number]) edema and/or pain. She will follow-up next week. She continues to smoke and her glucose levels remain elevated >250; she admits to compliance with offloading shoe 03/01/18 on evaluation today patient appears to be doing fairly well in regard to her left first toe ulcer. She has been tolerating the dressing changes with the [facility] Dressing without complication and overall this has definitely showed signs of improvement according to records as well is what the patient tells me today. I'm very pleased in that regard. She is having no pain today 03/08/18 She is here for follow up evaluation of a left great toe ulcer. She remains non-compliant with glucose control and smoking cessation; glucose levels consistently >200. She states that she got new shoe inserts/peg assist. She admits to compliance with offloading. Since my last evaluation there is significant improvement. We will switch to prisma at this time and she will follow up next week. She is noted to be tachycardic at this appointment, heart rate 120s; she  has a history of heart rate 70-130 according to our records. She admits to extreme agitation r/t personal issues; she was advised to monitor her heartrate and contact her physician if it does not return to a more normal range (<100). She takes cardizem twice daily. 03/15/18-She is here in follow-up evaluation for left great toe ulcer. She remains noncompliant with glucose control and smoking cessation. She admits to compliance with wearing offloading shoe. The ulcer is improved/stable and we will continue with the same treatment plan and she will follow-up next week 03/22/18-She is here for evaluation for left great toe ulcer. There continues to be significant improvement despite recurrent hyperglycemia (over 500 yesterday) and she continues to smoke. She has been compliant with offloading and we will continue with same treatment plan and she will follow-up next week. 03/29/18-She is here for evaluation for left great toe ulcer. Despite continuing to smoke and uncontrolled diabetes she continues to improve. She is compliant with offloading shoe. We will continue with the same treatment plan and she will follow-up next week 04/05/18- She is here in follow up evaluation for a left great toe ulcer; she presents with small pustule to left fifth toe (resembles ant bite). She admits to compliance with wearing offloading shoe; continues to smoke or have uncontrolled blood glucose control. There is more callus than usual with evidence of bleeding; she denies known trauma. 04/12/18-She is here for evaluation of left great toe ulcer. Despite noncompliance with glycemic control and smoking she continues to make improvement. She continues to wear offloading shoe. The pustule, that was identified last week, to the left fifth toe is resolved. She will follow-up in 2 weeks 05/03/18-she is seen in follow-up evaluation for a left great toe ulcer. She is compliant with  offloading, otherwise noncompliant with glycemic  control and smoking. She has plateaued and there is minimal improvement noted. We will transition to [facility], replaced the insert to her surgical shoe and she will follow-up in one week 05/10/18- She is here in follow up evaluation for a left great toe ulcer. It appears stable despite measurement change. We will continue with same treatment plan and follow up next week. 05/24/18-She is seen in follow-up evaluation for a left great toe ulcer. She remains compliant with offloading, has made significant improvement in her diet, decreasing the amount of sugar/soda. She said her recent A1c was 10.9 which is lower than. She did see a diabetic nutritionist/educator yesterday. She continues to smoke. We will continue with the same treatment plan and she'll follow-up next week. 05/31/18- She is seen in follow-up evaluation for left great toe ulcer. She continues to remain compliant with offloading, continues to make improvement in her diet, increasing her water and decreasing the amount of sugar/soda. She does continue to smoke with no desire to quit. We will apply Prisma to the depth and Hydrofera Blue over. We have not received insurance authorization for oasis. She will follow up next week. 06/07/18-She is seen in follow-up evaluation for left great toe ulcer. It has stalled according to today's measurements although base appears stable. She says she saw a diabetic educator yesterday; her average blood sugars are less than 300 which is an improvement for her. She continues to smoke and states "that's my next step" She continues with water over soda. We will order for xray, culture and reinstate ace wrap compression prior to placing apligraf for next week. She is voicing no complaints or concerns. Her dressing will change to iodoflex over the next week in preparation for apligraf. 06/14/18-She is seen in follow-up evaluation for left great toe ulcer. Plain film x-ray performed last week was negative  for osteomyelitis. Wound culture obtained last week grew strep B and OSSA; she is initiated on keflex and cefdinir today; there is erythema to the toe which could be from ace wrap compression, she has a history of wrapping too tight and has has been encouraged to maintain ace wraps that we place today. We will hold off on application of apligraf today, will apply next week after antibiotic therapy has been initiated. She admits today that she has resumed taking a shower with her foot/toe submerged in water, she has been reminded to keep foot/toe out of the bath water. She will be seen in follow up next week 06/21/18-she is seen in follow-up evaluation for left great toe ulcer. She is tolerating antibiotic therapy with no GI disturbance. The wound is stable. Apligraf was applied today. She has been decreasing her smoking, only had 4 cigarettes yesterday and 1 today. She continues being more compliant in diabetic diet. She will follow-up next week for evaluation of site, if stable will remove at 2 weeks. 06/28/18- She is here in follow up evalution. Apligraf was placed last week, she states the dressing fell off on Tuesday and she was dressing with hydrofera blue. She is healed and will be discharged from the clinic today. She has been instructed to continue with smoking cessation, continue monitoring glucose levels, offloading for an additional 4 weeks and continue with [name], [name] ([id number]) hydrofera blue for additional two weeks for any possible microscopic opening. Readmission: 08/07/18 on evaluation today patient presents for reevaluation concerning the ulcer of her right great toe. She was previously discharged on 06/28/18 healed. Nonetheless  she states that this began to show signs of drainage she subsequently went to her primary care provider. Subsequently an x-ray was performed on 08/01/18 which was negative. The patient was also placed on antibiotics at that time. Fortunately they should  have been effective for the infection. Nonetheless she's been experiencing some improvement but still has a lot of drainage coming from the wound itself. 08/14/18 on evaluation today patient's wound actually does show signs of improvement in regard to the erythema at this point. She has completed the antibiotics. With that being said we did discuss the possibility of placing her in a total contact cast as of today although I think that I may want to give this just a little bit more time to ensure nothing recurrence as far as her infection is concerned. I do not want to put in the cast and risk infection at that time if things are not completely resolved. With that being said she is gonna require some debridement today. 08/21/18 on evaluation today patient actually appears to be doing okay in regard to her toe ulcer. She's been tolerating the dressing changes without complication. With that being said it does appear that she is ready and in fact I think it's appropriate for [place] to go ahead and initiate the total contact cast today. Nonetheless she will require some sharp debridement to prepare the wound for application. Overall I feel like things have been progressing well but we do need to do something to get this to close more readily. 08/24/18 patient seen today for reevaluation after having had the total contact cast applied on Tuesday. She seems to have done very well the wound appears to be doing great and overall I'm pleased with the progress that she's made. There were no abnormal areas of rubbing from the cast on her lower extremity. 08/30/18 on evaluation today patient actually appears to be completely healed in regard to her plantar toe ulcer. She tells me at this point she's been having a lot of issues with the cast. She almost fell a couple of times the state shall the step of her dog a couple times as well. This is been a very frustrating process for her other nonetheless she has  completely healed the wound which is excellent news. Overall there does not appear to be the evidence of infection at this time which is great news. 09/11/18 evaluation today patient presents for follow-up concerning her great toe ulcer on the left which has unfortunately reopened since I last saw her which was only a couple of weeks ago. Unfortunately she was not able to get in to get the shoe and potentially the AFO that's gonna be necessary due to her left foot drop. She continues with offloading shoe but this is not enough to prevent her from reopening it appears. When we last had her in the total contact cast she did well from a healing standpoint but unfortunately the wound reopened as soon as she came out of the cast within just a couple of weeks. 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 This is also part of the reason why she falls on a regular basis. I just do not believe that is gonna be safe for [place] to reinitiate the total contact cast as  last time we had this on she fell 3 times one week which is definitely not normal for her. 09/18/18 upon evaluation today the patient actually appears to be doing about the same in regard to her toe ulcer. She did not contact [facility] as I asked her to even though I had given her the prescription. In fact she actually states that she has no idea where the prescription is. She did apparently call [facility] and they told her that all she needed to do was bring the prescription in order to be able to be seen and work on getting the AFO for her left foot. With all that being said she still does not have an appointment and I'm not sure were things stand that regard. I will give her a new prescription today in order to contact them to get this set up. 09/25/18 on  evaluation today patient actually appears to be doing about the same in regard to her toes ulcer. She does have a small areas which seems to have a lot of callous buildup around the edge of the wound which is going to need sharp debridement today. She still is waiting to be scheduled for evaluation with [facility] for possibility of an AFO. She states there supposed to call her tomorrow to get this set up. Unfortunately it does appear that her foot specifically the toe area is showing signs of erythema. There does not appear to be any systemic infection which is in these good news. 10/02/18 on evaluation today patient actually appears to be doing about the same in regard to her toe ulcer. This really has not done too well although it's not significantly larger it's also not significantly smaller. She has been tolerating the dressing changes without complication. She actually has her appointment with [facility] and [place] tomorrow to hopefully be measured for obtaining and AFO splint. I think this would be helpful preventing this from reoccurring. We had contemplated starting the cast this week although to be honest I am reluctant to do that as she's been having nausea, vomiting, and seizure activity over the past three days. She has a history of seizures and have been told is nothing that can be done for these. With Reposa, Renie A. ([id number]) that being said I do believe that along with the seizures have the nausea vomiting which upon further questioning doesn't seem to be the normal for her and makes me concerned for the possibility of infection or something else going on. I discussed this with the patient and her mother during the office visit today. I do not feel the wound is effective but maybe something else. The responses this was "this just happens to her at times and we don't know why". They did not seem to be interested in going to the hospital to have this checked out further. Electronic  Signature(s) Signed: 10/05/2018 8:01:46 PM By: Worthy Keeler PA-C Entered By: Worthy Keeler on 10/02/2018 11:50:47 [name] ([id number]) -------------------------------------------------------------------------------- Physical Exam Details Patient Name: Heather Dear A. Date of Service: 10/02/2018 11:00 AM Medical Record Number: [id number] Patient Account Number: [id number] Date of Birth/Sex: 03/06/1976 (42 y.o. F) Treating RN: Montey Hora Primary Care Provider: Priscille Kluver Other Clinician: Referring Provider: Priscille Kluver Treating Provider/Extender: STONE III, HOYT Weeks in Treatment: 8 Constitutional Well-nourished and well-hydrated in no acute distress. Respiratory normal breathing without difficulty. clear to auscultation bilaterally. Cardiovascular regular rate and rhythm with normal S1, S2. Psychiatric this patient is able to make decisions and demonstrates  good insight into disease process. Alert and Oriented x 3. pleasant and cooperative. Notes Patient's wound bed currently different callous around the edge of the wound which I did sharply debride away today. Other than this I did not perform any additional sharp debridement there is no [place] really noted on the surface of the wound. She does have the foot drop that's why we centered [facility] to see about getting and AFO. She is laying in the bed and seems very much more lethargic than what I normally expect to see. Electronic Signature(s) Signed: 10/05/2018 8:01:46 PM By: Worthy Keeler PA-C Entered By: Worthy Keeler on 10/02/2018 11:51:29 [name] ([id number]) -------------------------------------------------------------------------------- Physician Orders Details Patient Name: Heather Dear A. Date of Service: 10/02/2018 11:00 AM Medical Record Number: [id number] Patient Account Number: [id number] Date of Birth/Sex: 01/12/1976 (42 y.o. 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 Primary Wound Dressing Wound #4R Left Toe Great o Silver Alginate Secondary Dressing Wound #4R Left Toe Great o Dry Gauze o Conform/Kerlix Dressing Change Frequency Wound #4R Left Toe Great o Change dressing every other day. Follow-up Appointments Wound #4R Left Toe Great o Return Appointment in 1 week. Off-Loading o Other: - Referral to [employer] and [facility] for AFO Electronic Signature(s) Signed: 10/02/2018 5:28:58 PM By: Montey Hora Signed: 10/05/2018 8:01:46 PM By: Worthy Keeler PA-C Entered By: Montey Hora on 10/02/2018 11:46:16 [name], [name] ([id number]) -------------------------------------------------------------------------------- Problem List Details Patient Name: Heather Dear A. Date of Service: 10/02/2018 11:00 AM Medical Record Number: [id number] Patient Account Number: [id number] Date of Birth/Sex: 1975/12/11 (42 y.o. 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 ([id number]) -------------------------------------------------------------------------------- Progress Note Details Patient Name: Heather Dear A. Date of Service: 10/02/2018 11:00 AM Medical Record Number: [id number] Patient Account Number: [id number] Date of Birth/Sex: Dec 29, 1975 (42 y.o. F) Treating RN: Montey Hora Primary Care Provider: Priscille Kluver Other Clinician: Referring Provider: Priscille Kluver Treating Provider/Extender: Melburn Hake, HOYT Weeks in Treatment: 8 Subjective Chief Complaint Information obtained from Patient She is here for evaluation of a left great toe ulcer History of Present Illness (HPI) 01/18/18-She is here for initial evaluation of the left great toe ulcer. She is a poor historian in regards to timeframe in detail. She states approximately 4 weeks ago she lacerated her toe on something in the house. She followed up with  her primary care who placed her on Bactrim and ultimately a second dose of Bactrim prior to coming to wound clinic. She states she has been treating the toe with peroxide, Betadine and a Band-Aid. She did not check her blood sugar this morning but checked it yesterday morning it was 327; she is unaware of a recent A1c and there are no current records. She saw Dr. she would've orthopedics last week for an old injury to the left ankle, she states he did not see her toe, nor did she bring it to his attention. She smokes approximately 1 pack cigarettes a day. Her social situation is concerning, she arrives this morning with her mother who appears extremely intoxicated/under the influence; her mother  was asked to leave the room and be monitored by the patient's grandmother. The patient's aunt then accompanied the patient and the room throughout the rest of the appointment. We had a lengthy discussion regarding the deleterious effects of uncontrolled hyperglycemia and smoking as it relates to wound healing and overall health. She was strongly encouraged to decrease her smoking and get her diabetes under better control. She states she is currently on a diet and has cut down her [facility] consumption. The left toe is erythematous, macerated and slightly edematous with malodor present. The edema in her left foot is below her baseline, there is no erythema streaking. We will treat her with Santyl, doxycycline; we have ordered and xray, culture and provided a Peg assist surgical shoe and cultured the wound. 01/25/18-She is here in follow-up evaluation for a left great toe ulcer and presents with an abscess to her suprapubic area. She states her blood sugars remain elevated, feeling "sick" and if levels are below 250, but she is trying. She has made no attempt to decrease her smoking stating that we "can't take away her food in her cigarettes". She has been compliant with offloading using the PEG assist you. She is using Santyl daily. the culture obtained last week grew staph aureus and Enterococcus faecalis; continues on the doxycycline and Augmentin was added on Monday. The suprapubic area has erythema, no femoral variation, purple discoloration, minimal induration, was accessed with a cotton tip applicator with sanguinopurulent drainage, this was cultured, I suspect the current antibiotic treatment will cover and we will not add anything to her current treatment plan. She was advised to go to urgent care or ER with any change in redness, induration or fever. 02/01/18-She is here in follow-up evaluation for left great toe ulcers and a new abdominal abscess from last week. She was able to use packing  until earlier this week, where she "forgot it was there". She states she was feeling ill with GI symptoms last week and was not taking her antibiotic. She states her glucose levels have been predominantly less than 200, with occasional levels between 200-250. She thinks this was contributing to her GI symptoms as they have resolved without intervention. There continues to be significant laceration to left toe, otherwise it clinically looks stable/improved. There is now less superficial opening to the lateral aspect of the great toe that was residual blister. We will transition to [facility] to all wounds, she will continue her Augmentin. If there is no change or deterioration next week for reculture. 02/08/18-She is here in follow-up evaluation for left great toe ulcer and abdominal ulcer. There is an improvement in both wounds. She has been wrapping her left toe with coban, not by our direction, which has created an area of  discoloration to the medial aspect; she has been advised to NOT use coban secondary to her neuropathy. She states her glucose levels have been high over this last week ranging from 200-350, she continues to smoke. She admits to being less compliant with her offloading shoe. We will continue with same treatment plan and she will follow-up next week. 02/15/18-She is here in follow-up evaluation for left great toe ulcer and abdominal ulcer. The abdominal ulcer is epithelialized. The left great toe ulcer is improved and all injury from last week using the Coban wrap is resolved, the lateral ulcer is healed. She admits to noncompliance with wearing offloading shoe and admits to glucose levels being greater than 300 most of the week. She continues to smoke and expresses no desire to quit. There is one area medially that probes deeper than it has historically, erythema to the toe and dorsal foot has consistently waxed and waned. There is no overt signs of cellulitis or infection but  we will culture the wound for any occult infection given the new area of depth and erythema. We will hold off on Monds, Shamaria A. ([id number]) sensitivities for initiation of antibiotic therapy. 02/22/18-She is here in follow up evaluation for left great toe ulcer. There is overall significant improvement in both wound appearance, erythema and edema with changes made last week. She was not initiated on antibiotic therapy. Culture obtained last week showed oxacillin sensitive staph aureus, sensitive to clindamycin. Clindamycin has been called into the pharmacy but she has been instructed to hold off on initiation secondary to overall clinical improvement and her history of antibiotic intolerance. She has been instructed to contact the clinic with any noted changes/deterioration and the wound, erythema, edema and/or pain. She will follow-up next week. She continues to smoke and her glucose levels remain elevated >250; she admits to compliance with offloading shoe 03/01/18 on evaluation today patient appears to be doing fairly well in regard to her left first toe ulcer. She has been tolerating the dressing changes with the [facility] Dressing without complication and overall this has definitely showed signs of improvement according to records as well is what the patient tells me today. I'm very pleased in that regard. She is having no pain today 03/08/18 She is here for follow up evaluation of a left great toe ulcer. She remains non-compliant with glucose control and smoking cessation; glucose levels consistently >200. She states that she got new shoe inserts/peg assist. She admits to compliance with offloading. Since my last evaluation there is significant improvement. We will switch to prisma at this time and she will follow up next week. She is noted to be tachycardic at this appointment, heart rate 120s; she has a history of heart rate 70-130 according to our records. She admits to extreme  agitation r/t personal issues; she was advised to monitor her heartrate and contact her physician if it does not return to a more normal range (<100). She takes cardizem twice daily. 03/15/18-She is here in follow-up evaluation for left great toe ulcer. She remains noncompliant with glucose control and smoking cessation. She admits to compliance with wearing offloading shoe. The ulcer is improved/stable and we will continue with the same treatment plan and she will follow-up next week 03/22/18-She is here for evaluation for left great toe ulcer. There continues to be significant improvement despite recurrent hyperglycemia (over 500 yesterday) and she continues to smoke. She has been compliant with offloading and we will continue with same treatment plan and she will  follow-up next week. 03/29/18-She is here for evaluation for left great toe ulcer. Despite continuing to smoke and uncontrolled diabetes she continues to improve. She is compliant with offloading shoe. We will continue with the same treatment plan and she will follow-up next week 04/05/18- She is here in follow up evaluation for a left great toe ulcer; she presents with small pustule to left fifth toe (resembles ant bite). She admits to compliance with wearing offloading shoe; continues to smoke or have uncontrolled blood glucose control. There is more callus than usual with evidence of bleeding; she denies known trauma. 04/12/18-She is here for evaluation of left great toe ulcer. Despite noncompliance with glycemic control and smoking she continues to make improvement. She continues to wear offloading shoe. The pustule, that was identified last week, to the left fifth toe is resolved. She will follow-up in 2 weeks 05/03/18-she is seen in follow-up evaluation for a left great toe ulcer. She is compliant with offloading, otherwise noncompliant with glycemic control and smoking. She has plateaued and there is minimal improvement noted. We will  transition to [facility], replaced the insert to her surgical shoe and she will follow-up in one week 05/10/18- She is here in follow up evaluation for a left great toe ulcer. It appears stable despite measurement change. We will continue with same treatment plan and follow up next week. 05/24/18-She is seen in follow-up evaluation for a left great toe ulcer. She remains compliant with offloading, has made significant improvement in her diet, decreasing the amount of sugar/soda. She said her recent A1c was 10.9 which is lower than. She did see a diabetic nutritionist/educator yesterday. She continues to smoke. We will continue with the same treatment plan and she'll follow-up next week. 05/31/18- She is seen in follow-up evaluation for left great toe ulcer. She continues to remain compliant with offloading, continues to make improvement in her diet, increasing her water and decreasing the amount of sugar/soda. She does continue to smoke with no desire to quit. We will apply Prisma to the depth and Hydrofera Blue over. We have not received insurance authorization for oasis. She will follow up next week. 06/07/18-She is seen in follow-up evaluation for left great toe ulcer. It has stalled according to today's measurements although base appears stable. She says she saw a diabetic educator yesterday; her average blood sugars are less than 300 which is an improvement for her. She continues to smoke and states "that's my next step" She continues with water over soda. We will order for xray, culture and reinstate ace wrap compression prior to placing apligraf for next week. She is voicing no complaints or concerns. Her dressing will change to iodoflex over the next week in preparation for apligraf. 06/14/18-She is seen in follow-up evaluation for left great toe ulcer. Plain film x-ray performed last week was negative for osteomyelitis. Wound culture obtained last week grew strep B and OSSA; she is initiated  on keflex and cefdinir today; there is erythema to the toe which could be from ace wrap compression, she has a history of wrapping too tight and has has been encouraged to maintain ace wraps that we place today. We will hold off on application of apligraf today, will apply next week after antibiotic therapy has been initiated. She admits today that she has resumed taking a shower with her foot/toe submerged in water, she has been reminded to keep foot/toe out of the bath water. She will be seen in follow up next week 06/21/18-she is  seen in follow-up evaluation for left great toe ulcer. She is tolerating antibiotic therapy with no GI disturbance. The wound is stable. Apligraf was applied today. She has been decreasing her smoking, only had 4 cigarettes yesterday and [name], [name] ([id number]) 1 today. She continues being more compliant in diabetic diet. She will follow-up next week for evaluation of site, if stable will remove at 2 weeks. 06/28/18- She is here in follow up evalution. Apligraf was placed last week, she states the dressing fell off on Tuesday and she was dressing with hydrofera blue. She is healed and will be discharged from the clinic today. She has been instructed to continue with smoking cessation, continue monitoring glucose levels, offloading for an additional 4 weeks and continue with hydrofera blue for additional two weeks for any possible microscopic opening. Readmission: 08/07/18 on evaluation today patient presents for reevaluation concerning the ulcer of her right great toe. She was previously discharged on 06/28/18 healed. Nonetheless she states that this began to show signs of drainage she subsequently went to her primary care provider. Subsequently an x-ray was performed on 08/01/18 which was negative. The patient was also placed on antibiotics at that time. Fortunately they should have been effective for the infection. Nonetheless she's been experiencing some improvement  but still has a lot of drainage coming from the wound itself. 08/14/18 on evaluation today patient's wound actually does show signs of improvement in regard to the erythema at this point. She has completed the antibiotics. With that being said we did discuss the possibility of placing her in a total contact cast as of today although I think that I may want to give this just a little bit more time to ensure nothing recurrence as far as her infection is concerned. I do not want to put in the cast and risk infection at that time if things are not completely resolved. With that being said she is gonna require some debridement today. 08/21/18 on evaluation today patient actually appears to be doing okay in regard to her toe ulcer. She's been tolerating the dressing changes without complication. With that being said it does appear that she is ready and in fact I think it's appropriate for [place] to go ahead and initiate the total contact cast today. Nonetheless she will require some sharp debridement to prepare the wound for application. Overall I feel like things have been progressing well but we do need to do something to get this to close more readily. 08/24/18 patient seen today for reevaluation after having had the total contact cast applied on Tuesday. She seems to have done very well the wound appears to be doing great and overall I'm pleased with the progress that she's made. There were no abnormal areas of rubbing from the cast on her lower extremity. 08/30/18 on evaluation today patient actually appears to be completely healed in regard to her plantar toe ulcer. She tells me at this point she's been having a lot of issues with the cast. She almost fell a couple of times the state shall the step of her dog a couple times as well. This is been a very frustrating process for her other nonetheless she has completely healed the wound which is excellent news. Overall there does not appear to be the evidence  of infection at this time which is great news. 09/11/18 evaluation today patient presents for follow-up concerning her great toe ulcer on the left which has unfortunately reopened since I last saw her which was  only a couple of weeks ago. Unfortunately she was not able to get in to get the shoe and potentially the AFO that's gonna be necessary due to her left foot drop. She continues with offloading shoe but this is not enough to prevent her from reopening it appears. When we last had her in the total contact cast she did well from a healing standpoint but unfortunately the wound reopened as soon as she came out of the cast within just a couple of weeks. 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 This is also part of the reason why she falls on a regular basis. I just do not believe that is gonna be safe for [place] to reinitiate the total contact cast as last time we had this on she fell 3 times one week which is definitely not normal for her. 09/18/18 upon evaluation today the patient actually appears to be doing about the same in regard to her toe ulcer. She did not contact [facility] as I asked her to even though I had given her the prescription. In fact she actually states that she has no idea where the prescription is. She did apparently call [facility] and they told her that all she needed to do was bring the prescription in order to be able to be seen and work on getting the AFO for her left foot. With all that being said she still does not have an appointment and I'm not sure were things stand that regard. I will give her a new prescription today in order to contact them to get this set up. 09/25/18 on evaluation today patient actually appears to be doing about the same in regard to her toes ulcer. She does  have a small areas which seems to have a lot of callous buildup around the edge of the wound which is going to need sharp debridement today. She still is waiting to be scheduled for evaluation with [facility] for possibility of an AFO. She states there supposed to call her tomorrow to get this set up. Unfortunately it does appear that her foot specifically the toe area is showing signs of erythema. There does not appear to be any systemic infection which is in these good news. [name], [name] ([id number]) 10/02/18 on evaluation today patient actually appears to be doing about the same in regard to her toe ulcer. This really has not done too well although it's not significantly larger it's also not significantly smaller. She has been tolerating the dressing changes without complication. She actually has her appointment with [facility] and [place] tomorrow to hopefully be measured for obtaining and AFO splint. I think this would be helpful preventing this from reoccurring. We had contemplated starting the cast this week although to be honest I am reluctant to do that as she's been having nausea, vomiting, and seizure activity over the past three days. She has a history of seizures and have been told is nothing that can be done for these. With that being said I do believe that along with the seizures have the nausea vomiting which upon further questioning doesn't seem to be the normal for her and makes me concerned for the possibility of infection or something else going on. I discussed this with the patient and her mother  during the office visit today. I do not feel the wound is effective but maybe something else. The responses this was "this just happens to her at times and we don't know why". They did not seem to be interested in going to the hospital to have this checked out further. Patient History Information obtained from Patient. Social History Current every day smoker. Review of Systems  (ROS) Constitutional Symptoms (General Health) Denies complaints or symptoms of Fever, Chills, Marked Weight Change. Respiratory The patient has no complaints or symptoms. Cardiovascular The patient has no complaints or symptoms. Psychiatric The patient has no complaints or symptoms. Objective Constitutional Well-nourished and well-hydrated in no acute distress. Vitals Time Taken: 11:22 AM, Height: 69 in, Weight: 240 lbs, BMI: 35.4, Temperature: 98.3 F, Pulse: 96 bpm, Respiratory Rate: 16 breaths/min, Blood Pressure: 119/84 mmHg. Respiratory normal breathing without difficulty. clear to auscultation bilaterally. Cardiovascular regular rate and rhythm with normal S1, S2. Psychiatric this patient is able to make decisions and demonstrates good insight into disease process. Alert and Oriented x 3. pleasant and cooperative. General Notes: Patient's wound bed currently different callous around the edge of the wound which I did sharply debride away today. Other than this I did not perform any additional sharp debridement there is no Slough really noted on the surface [name], [name] ([id number]) of the wound. She does have the foot drop that's why we centered [facility] to see about getting and AFO. She is laying in the bed and seems very much more lethargic than what I normally expect to see. Integumentary (Hair, Skin) Wound #4R status is Open. Original cause of wound was Gradually Appeared. The wound is located on the Left Toe Great. The wound measures 1.2cm length x 0.7cm width x 0.4cm depth; 0.66cm^2 area and 0.264cm^3 volume. There is Fat Layer (Subcutaneous Tissue) Exposed exposed. There is no tunneling or undermining noted. There is a small amount of serous drainage noted. The wound margin is thickened. There is medium (34-66%) pink granulation within the wound bed. There is a medium (34-66%) amount of necrotic tissue within the wound bed including Adherent Slough. The periwound  skin appearance exhibited: Callus, Maceration. The periwound skin appearance did not exhibit: Crepitus, Excoriation, Induration, Rash, Scarring, Dry/Scaly, Atrophie Blanche, Cyanosis, Ecchymosis, Hemosiderin Staining, Mottled, Pallor, Rubor, Erythema. 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 There was a Selective/Open Wound Non-Viable Tissue Debridement with a total area of 0.84 sq cm performed by STONE III, HOYT E., PA-C. With the following instrument(s): Curette to remove Non-Viable tissue/material. Material removed includes Callus after achieving pain control using Lidocaine 4% Topical Solution. No specimens were taken. A time out was conducted at 11:42, prior to the start of the procedure. A Minimum amount of bleeding was controlled with Pressure. The procedure was tolerated well with a pain level of 0 throughout and a pain level of 0 following the procedure. Post Debridement Measurements: 1.2cm length x 0.7cm width x 0.3cm depth; 0.198cm^3 volume. Character of Wound/Ulcer Post Debridement is improved. Severity of Tissue Post Debridement is: Fat layer exposed. Post procedure Diagnosis Wound #4R: Same as Pre-Procedure Plan Wound Cleansing: Wound #4R Left Toe Great: Cleanse wound with mild soap and water Anesthetic (add to  Medication List): Wound #4R Left Toe Great: Topical Lidocaine 4% cream applied to wound bed prior to debridement (In Clinic Only). Primary Wound Dressing: Wound #4R Left Toe GreatIANTHA, TITSWORTH A. ([id number]) Silver Alginate Secondary Dressing: Wound #4R Left Toe Great: Dry Gauze Conform/Kerlix Dressing Change Frequency: Wound #4R Left  Toe Great: Change dressing every other day. Follow-up Appointments: Wound #4R Left Toe Great: Return Appointment in 1 week. Off-Loading: Other: - Referral to [facility] for AFO I'm gonna recommend at this point in time that we continue with the above wound care measures for the next week. Hopefully if everything settles down as she is better by that time as far as the nausea, vomiting, and seizure activity is concerned we could consider the cast. With that being said meantime if anything worsens in this regard I recommended that the patient go ASAP to the emergency department for further evaluation and treatment. In the meantime I am going to suggest that she keep the appointment tomorrow with [facility] in order to hopefully be measured for and AFO. We will see were things stand in one week. Please see above for specific wound care orders. We will see patient for re-evaluation in 1 week(s) here in the clinic. If anything worsens or changes patient will contact our office for additional recommendations. Electronic Signature(s) Signed: 10/05/2018 8:01:46 PM By: Worthy Keeler PA-C Entered By: Worthy Keeler on 10/02/2018 11:52:32 [name] ([id number]) -------------------------------------------------------------------------------- ROS/PFSH Details Patient Name: Heather Dear A. Date of Service: 10/02/2018 11:00 AM Medical Record Number: [id number] Patient Account Number: [id number] Date of Birth/Sex: 1975-11-22 (41 y.o. F) Treating RN: Montey Hora Primary Care Provider: Priscille Kluver Other Clinician: Referring Provider: Priscille Kluver Treating Provider/Extender: STONE III, HOYT Weeks in Treatment: 8 Information Obtained From Patient Wound History Constitutional Symptoms (General Health) Complaints and Symptoms: Negative for: Fever; Chills; Marked Weight Change Eyes Medical History: Positive for: Cataracts; Glaucoma; Optic Neuritis Ear/Nose/Mouth/Throat Medical  History: Positive for: Chronic sinus problems/congestion; Middle ear problems Respiratory Complaints and Symptoms: No Complaints or Symptoms Cardiovascular Complaints and Symptoms: No Complaints or Symptoms Endocrine Medical History: Positive for: Type II Diabetes Time with diabetes: 10 years Treated with: Insulin, Oral agents Blood sugar tested every day: No Blood sugar testing results: Lunch: 350 Psychiatric Complaints and Symptoms: No Complaints or Symptoms HBO Extended History Items Ear/Nose/Mouth/Throat: Eyes: Eyes: Ear/Nose/Mouth/Throat: Chronic sinus Cataracts Glaucoma Middle ear problems problems/congestion [name], [name] ([id number]) Implantable Devices Family and Social History Current every day smoker Physician Affirmation I have reviewed and agree with the above information. Electronic Signature(s) Signed: 10/02/2018 5:28:58 PM By: Montey Hora Signed: 10/05/2018 8:01:46 PM By: Worthy Keeler PA-C Entered By: Worthy Keeler on 10/02/2018 11:51:05 [name], [name] Kitchen ([id number]) -------------------------------------------------------------------------------- SuperBill Details Patient Name: Heather Dear A. Date of Service: 10/02/2018 Medical Record Number: [id number] Patient Account Number: [id number] Date of Birth/Sex: 1976-01-03 (42 y.o. 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 drop, left foot Facility Procedures CPT4 Code: [id number] Description: 24235 - DEBRIDE WOUND 1ST 20 SQ CM OR < ICD-10 Diagnosis Description L97.522 Non-pressure chronic ulcer of other part of left foot with fat Modifier: layer  exposed Quantity: 1 Physician Procedures CPT4 Code: 3614431 Description: 54008 - WC PHYS LEVEL 4 -  NEW PT ICD-10 Diagnosis Description E11.621 Type 2 diabetes mellitus with foot ulcer L97.522 Non-pressure chronic ulcer of other part of left foot with fat M21.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

## 2018-10-09 ENCOUNTER — Encounter: Payer: Medicaid Other | Admitting: Physician Assistant

## 2018-10-09 DIAGNOSIS — E11621 Type 2 diabetes mellitus with foot ulcer: Secondary | ICD-10-CM | POA: Diagnosis not present

## 2018-10-10 NOTE — Progress Notes (Signed)
CHABELI, BARSAMIAN ([id number]) Visit Report for 10/09/2018 Arrival Information Details Patient Name: [name], [name] Date of Service: 10/09/2018 11:00 AM Medical Record Number: [id number] Patient Account Number: [id number] Date of Birth/Sex: [birth year] (42 y.o. F) Treating RN: Cornell Barman Primary Care Lauralye Kinn: Priscille Kluver Other Clinician: Referring Leen Tworek: Priscille Kluver Treating Travis Mastel/Extender: Melburn Hake, HOYT Weeks in Treatment: 9 Visit Information History Since Last Visit Added or deleted any medications: No Patient Arrived: Wheel Chair Any new allergies or adverse reactions: No Arrival Time: 11:02 Had a fall or experienced change in No activities of daily living that may affect Accompanied By: Mom risk of falls: Transfer Assistance: None Signs or symptoms of abuse/neglect since last visito No Patient Identification Verified: Yes Hospitalized since last visit: No Secondary Verification Process Completed: Yes Implantable device outside of the clinic excluding No Patient Requires Transmission-Based No cellular tissue based products placed in the center Precautions: since last visit: Patient Has Alerts: No Has Dressing in Place as Prescribed: Yes Pain Present Now: No Electronic Signature(s) Signed: 10/10/2018 8:39:20 AM By: Gretta Cool, BSN, RN, CWS, [name] RN, BSN Entered By: Gretta Cool, BSN, RN, CWS, [name] on 10/09/2018 11:02:59 [name] ([id number]) -------------------------------------------------------------------------------- Lower Extremity Assessment Details Patient Name: Heather Dear A. Date of Service: 10/09/2018 11:00 AM Medical Record Number: [id number] Patient Account Number: [id number] Date of Birth/Sex: [birth year] (42 y.o. F) Treating RN: Cornell Barman Primary Care Ermina Oberman: Priscille Kluver Other Clinician: Referring Yashua Bracco: Priscille Kluver Treating Frans Valente/Extender: Melburn Hake, HOYT Weeks in Treatment: 9 Vascular Assessment Pulses: Dorsalis Pedis Palpable:  [Left:Yes] Posterior Tibial Extremity colors, hair growth, and conditions: Extremity Color: [Left:Normal] Hair Growth on Extremity: [Left:Yes] Temperature of Extremity: [Left:Warm] Capillary Refill: [Left:< 3 seconds] Toe Nail Assessment Left: Right: Thick: No Discolored: No Deformed: No Improper Length and Hygiene: No Electronic Signature(s) Signed: 10/10/2018 8:39:20 AM By: Gretta Cool, BSN, RN, CWS, [name] RN, BSN Entered By: Gretta Cool, BSN, RN, CWS, [name] on 10/09/2018 11:07:03 [name] ([id number]) -------------------------------------------------------------------------------- Multi Wound Chart Details Patient Name: Heather Dear A. Date of Service: 10/09/2018 11:00 AM Medical Record Number: [id number] Patient Account Number: [id number] Date of Birth/Sex: 1976-01-10 (42 y.o. F) Treating RN: Montey Hora Primary Care Ajax Schroll: Priscille Kluver Other Clinician: Referring Joah Patlan: Priscille Kluver Treating Winola Drum/Extender: STONE III, HOYT Weeks in Treatment: 9 Vital Signs Height(in): 69 Pulse(bpm): 83 Weight(lbs): 240 Blood Pressure(mmHg): 95/62 Body Mass Index(BMI): 35 Temperature(F): 99.3 Respiratory Rate 16 (breaths/min): Photos: [N/A:N/A] Wound Location: Left Toe Great N/A N/A Wounding Event: Gradually Appeared N/A N/A Primary Etiology: Diabetic Wound/Ulcer of the N/A N/A Lower Extremity Date Acquired: 07/24/2018 N/A N/A Weeks of Treatment: 9 N/A N/A Wound Status: Open N/A N/A Wound Recurrence: Yes N/A N/A Pending Amputation on Yes N/A N/A Presentation: Measurements L x W x D 1.2x0.4x0.4 N/A N/A (cm) Area (cm) : 0.377 N/A N/A Volume (cm) : 0.151 N/A N/A % Reduction in Area: 75.80% N/A N/A % Reduction in Volume: 3.20% N/A N/A Classification: Grade 2 N/A N/A Periwound Skin Texture: No Abnormalities Noted N/A N/A Periwound Skin Moisture: No Abnormalities Noted N/A N/A Periwound Skin Color: No Abnormalities Noted N/A N/A Tenderness on Palpation: No N/A  N/A Treatment Notes Electronic Signature(s) Signed: 10/09/2018 5:03:08 PM By: Montey Hora Entered By: Montey Hora on 10/09/2018 11:46:13 [name] ([id number]) [name], Irasema AMarland Kitchen ([id number]) -------------------------------------------------------------------------------- Multi-Disciplinary Care Plan Details Patient Name: Heather Dear A. Date of Service: 10/09/2018 11:00 AM Medical Record Number: [id number] Patient Account Number: [id number] Date of Birth/Sex: 12/02/1975 (42 y.o. F) Treating RN: Montey Hora Primary Care Rondey Fallen: Priscille Kluver  Other Clinician: Referring Yessenia Maillet: DAVIS, DEBRA Treating Hayleen Clinkscales/Extender: STONE III, HOYT Weeks in Treatment: 9 Active Inactive Abuse / Safety / Falls / Self Care Management Nursing Diagnoses: History of Falls Goals: Patient will not experience any injury related to falls Date Initiated: 08/07/2018 Target Resolution Date: 10/19/2018 Goal Status: Active Interventions: Assess fall risk on admission and as needed Notes: Orientation to the Wound Care Program Nursing Diagnoses: Knowledge deficit related to the wound healing center program Goals: Patient/caregiver will verbalize understanding of the [facility] Date Initiated: 08/07/2018 Target Resolution Date: 10/19/2018 Goal Status: Active Interventions: Provide education on orientation to the wound center Notes: Soft Tissue Infection Nursing Diagnoses: Impaired tissue integrity Goals: Patient will remain free of wound infection Date Initiated: 08/07/2018 Target Resolution Date: 10/19/2018 Goal Status: Active Interventions: Assess signs and symptoms of infection every visit Rosas, Tranise A. ([id number]) Notes: Wound/Skin Impairment Nursing Diagnoses: Impaired tissue integrity Goals: Ulcer/skin breakdown will heal within 14 weeks Date Initiated: 08/07/2018 Target Resolution Date: 10/19/2018 Goal Status: Active Interventions: Assess  patient/caregiver ability to obtain necessary supplies Notes: Electronic Signature(s) Signed: 10/09/2018 5:03:08 PM By: Montey Hora Entered By: Montey Hora on 10/09/2018 11:46:06 Vawter, Doryce A. ([id number]) -------------------------------------------------------------------------------- Pain Assessment Details Patient Name: Heather Dear A. Date of Service: 10/09/2018 11:00 AM Medical Record Number: [id number] Patient Account Number: [id number] Date of Birth/Sex: Jun 01, 1976 (42 y.o. F) Treating RN: Cornell Barman Primary Care Florine Sprenkle: Priscille Kluver Other Clinician: Referring Loukas Antonson: Priscille Kluver Treating Asheton Viramontes/Extender: Melburn Hake, HOYT Weeks in Treatment: 9 Active Problems Location of Pain Severity and Description of Pain Patient Has Paino No Site Locations Pain Management and Medication Current Pain Management: Electronic Signature(s) Signed: 10/10/2018 8:39:20 AM By: Gretta Cool, BSN, RN, CWS, [name] RN, BSN Entered By: Gretta Cool, BSN, RN, CWS, [name] on 10/09/2018 11:03:05 [name] ([id number]) -------------------------------------------------------------------------------- Wound Assessment Details Patient Name: Heather Dear A. Date of Service: 10/09/2018 11:00 AM Medical Record Number: [id number] Patient Account Number: [id number] Date of Birth/Sex: [birth year] (42 y.o. F) Treating RN: Cornell Barman Primary Care Carrye Goller: Priscille Kluver Other Clinician: Referring Kaoru Benda: Priscille Kluver Treating Ahmaud Duthie/Extender: STONE III, HOYT Weeks in Treatment: 9 Wound Status Wound Number: 4R Primary Diabetic Wound/Ulcer of the Lower Etiology: Extremity Wound Location: Left Toe Great Wound Status: Open Wounding Event: Gradually Appeared Date Acquired: 07/24/2018 Weeks Of Treatment: 9 Clustered Wound: No Pending Amputation On Presentation Photos Photo Uploaded By: Gretta Cool, BSN, RN, CWS, [name] on 10/09/2018 11:40:33 Wound Measurements Length: (cm) 1.2 Width: (cm) 0.4 Depth: (cm)  0.4 Area: (cm) 0.377 Volume: (cm) 0.151 % Reduction in Area: 75.8% % Reduction in Volume: 3.2% Wound Description Classification: Grade 2 Periwound Skin Texture Texture Color No Abnormalities Noted: No No Abnormalities Noted: No Moisture No Abnormalities Noted: No Electronic Signature(s) Signed: 10/10/2018 8:39:20 AM By: Gretta Cool, BSN, RN, CWS, [name] RN, BSN Entered By: Gretta Cool, BSN, RN, CWS, [name] on 10/09/2018 11:06:12 [name] ([id number]) -------------------------------------------------------------------------------- Vitals Details Patient Name: Heather Dear A. Date of Service: 10/09/2018 11:00 AM Medical Record Number: [id number] Patient Account Number: [id number] Date of Birth/Sex: 11/30/1975 (42 y.o. F) Treating RN: Montey Hora Primary Care Lisamarie Coke: Priscille Kluver Other Clinician: Referring Staphanie Harbison: Priscille Kluver Treating Eulamae Greenstein/Extender: STONE III, HOYT Weeks in Treatment: 9 Vital Signs Time Taken: 11:05 Temperature (F): 99.3 Height (in): 69 Pulse (bpm): 83 Weight (lbs): 240 Respiratory Rate (breaths/min): 16 Body Mass Index (BMI): 35.4 Blood Pressure (mmHg): 95/62 Reference Range: 80 - 120 mg / dl Electronic Signature(s) Signed: 10/09/2018 4:40:17 PM By: Lorine Bears RCP, RRT, CHT Entered By: Juleen China,  RCP,RRT,CHT, Sallie on 10/09/2018 11:08:54

## 2018-10-11 NOTE — Progress Notes (Signed)
[name], [name] ([id number]) Visit Report for 10/09/2018 Chief Complaint Document Details Patient Name: [name], [name] Date of Service: 10/09/2018 11:00 AM Medical Record Number: [id number] Patient Account Number: [id number] Date of Birth/Sex: 1975-12-07 (42 y.o. F) Treating RN: Montey Hora Primary Care Provider: Priscille Kluver Other Clinician: Referring Provider: Priscille Kluver Treating Provider/Extender: Melburn Hake, HOYT Weeks in Treatment: 9 Information Obtained from: Patient Chief Complaint She is here for evaluation of a left great toe ulcer Electronic Signature(s) Signed: 10/09/2018 6:07:41 PM By: Worthy Keeler PA-C Entered By: Worthy Keeler on 10/09/2018 11:10:14 [name] ([id number]) -------------------------------------------------------------------------------- Debridement Details Patient Name: Heather Dear A. Date of Service: 10/09/2018 11:00 AM Medical Record Number: [id number] Patient Account Number: [id number] Date of Birth/Sex: December 20, 1975 (42 y.o. F) Treating RN: Montey Hora Primary Care Provider: Priscille Kluver Other Clinician: Referring Provider: Priscille Kluver Treating Provider/Extender: STONE III, HOYT Weeks in Treatment: 9 Debridement Performed for Wound #4R Left Toe Great Assessment: Performed By: Physician STONE III, HOYT E., PA-C Debridement Type: Debridement Severity of Tissue Pre Fat layer exposed Debridement: Level of Consciousness (Pre- Awake and Alert procedure): Pre-procedure Verification/Time Yes - 11:47 Out Taken: Start Time: 11:47 Pain Control: Lidocaine 4% Topical Solution Total Area Debrided (L x W): 1.2 (cm) x 0.4 (cm) = 0.48 (cm) Tissue and other material Non-Viable, Callus debrided: Level: Non-Viable Tissue Debridement Description: Selective/Open Wound Instrument: Curette Bleeding: None End Time: 11:49 Procedural Pain: 0 Post Procedural Pain: 0 Response to Treatment: Procedure was tolerated well Level of  Consciousness Awake and Alert (Post-procedure): Post Debridement Measurements of Total Wound Length: (cm) 1.2 Width: (cm) 0.4 Depth: (cm) 0.3 Volume: (cm) 0.113 Character of Wound/Ulcer Post Debridement: Improved Severity of Tissue Post Debridement: Fat layer exposed Post Procedure Diagnosis Same as Pre-procedure Electronic Signature(s) Signed: 10/09/2018 5:03:08 PM By: Montey Hora Signed: 10/09/2018 6:07:41 PM By: Worthy Keeler PA-C Entered By: Montey Hora on 10/09/2018 11:50:05 [name], [name] ([id number]) -------------------------------------------------------------------------------- HPI Details Patient Name: Heather Dear A. Date of Service: 10/09/2018 11:00 AM Medical Record Number: [id number] Patient Account Number: [id number] Date of Birth/Sex: 1976-01-25 (42 y.o. F) Treating RN: Montey Hora Primary Care Provider: Priscille Kluver Other Clinician: Referring Provider: Priscille Kluver Treating Provider/Extender: Melburn Hake, HOYT Weeks in Treatment: 9 History of Present Illness HPI Description: 01/18/18-She is here for initial evaluation of the left great toe ulcer. She is a poor historian in regards to timeframe in detail. She states approximately 4 weeks ago she lacerated her toe on something in the house. She followed up with her primary care who placed her on Bactrim and ultimately a second dose of Bactrim prior to coming to wound clinic. She states she has been treating the toe with peroxide, Betadine and a Band-Aid. She did not check her blood sugar this morning but checked it yesterday morning it was 327; she is unaware of a recent A1c and there are no current records. She saw Dr. she would've orthopedics last week for an old injury to the left ankle, she states he did not see her toe, nor did she bring it to his attention. She smokes approximately 1 pack cigarettes a day. Her social situation is concerning, she arrives this morning with her mother who appears  extremely intoxicated/under the influence; her mother was asked to leave the room and be monitored by the patient's grandmother. The patient's aunt then accompanied the patient and the room throughout the rest of the appointment. We had a lengthy discussion regarding the deleterious effects of uncontrolled hyperglycemia and  smoking as it relates to wound healing and overall health. She was strongly encouraged to decrease her smoking and get her diabetes under better control. She states she is currently on a diet and has cut down her [facility] consumption. The left toe is erythematous, macerated and slightly edematous with malodor present. The edema in her left foot is below her baseline, there is no erythema streaking. We will treat her with Santyl, doxycycline; we have ordered and xray, culture and provided a Peg assist surgical shoe and cultured the wound. 01/25/18-She is here in follow-up evaluation for a left great toe ulcer and presents with an abscess to her suprapubic area. She states her blood sugars remain elevated, feeling "sick" and if levels are below 250, but she is trying. She has made no attempt to decrease her smoking stating that we "can't take away her food in her cigarettes". She has been compliant with offloading using the PEG assist you. She is using Santyl daily. the culture obtained last week grew staph aureus and Enterococcus faecalis; continues on the doxycycline and Augmentin was added on Monday. The suprapubic area has erythema, no femoral variation, purple discoloration, minimal induration, was accessed with a cotton tip applicator with sanguinopurulent drainage, this was cultured, I suspect the current antibiotic treatment will cover and we will not add anything to her current treatment plan. She was advised to go to urgent care or ER with any change in redness, induration or fever. 02/01/18-She is here in follow-up evaluation for left great toe ulcers and a new abdominal  abscess from last week. She was able to use packing until earlier this week, where she "forgot it was there". She states she was feeling ill with GI symptoms last week and was not taking her antibiotic. She states her glucose levels have been predominantly less than 200, with occasional levels between 200-250. She thinks this was contributing to her GI symptoms as they have resolved without intervention. There continues to be significant laceration to left toe, otherwise it clinically looks stable/improved. There is now less superficial opening to the lateral aspect of the great toe that was residual blister. We will transition to [facility] to all wounds, she will continue her Augmentin. If there is no change or deterioration next week for reculture. 02/08/18-She is here in follow-up evaluation for left great toe ulcer and abdominal ulcer. There is an improvement in both wounds. She has been wrapping her left toe with coban, not by our direction, which has created an area of discoloration to the medial aspect; she has been advised to NOT use coban secondary to her neuropathy. She states her glucose levels have been high over this last week ranging from 200-350, she continues to smoke. She admits to being less compliant with her offloading shoe. We will continue with same treatment plan and she will follow-up next week. 02/15/18-She is here in follow-up evaluation for left great toe ulcer and abdominal ulcer. The abdominal ulcer is epithelialized. The left great toe ulcer is improved and all injury from last week using the Coban wrap is resolved, the lateral ulcer is healed. She admits to noncompliance with wearing offloading shoe and admits to glucose levels being greater than 300 most of the week. She continues to smoke and expresses no desire to quit. There is one area medially that probes deeper than it has historically, erythema to the toe and dorsal foot has consistently waxed and waned.  There is no overt signs of cellulitis or infection but  we will culture the wound for any occult infection given the new area of depth and erythema. We will hold off on sensitivities for initiation of antibiotic therapy. 02/22/18-She is here in follow up evaluation for left great toe ulcer. There is overall significant improvement in both wound appearance, erythema and edema with changes made last week. She was not initiated on antibiotic therapy. Culture obtained last week showed oxacillin sensitive staph aureus, sensitive to clindamycin. Clindamycin has been called into the pharmacy but she has been instructed to hold off on initiation secondary to overall clinical improvement and her history of antibiotic intolerance. She has been instructed to contact the clinic with any noted changes/deterioration and the wound, erythema, [name], [name] ([id number]) edema and/or pain. She will follow-up next week. She continues to smoke and her glucose levels remain elevated >250; she admits to compliance with offloading shoe 03/01/18 on evaluation today patient appears to be doing fairly well in regard to her left first toe ulcer. She has been tolerating the dressing changes with the [facility] Dressing without complication and overall this has definitely showed signs of improvement according to records as well is what the patient tells me today. I'm very pleased in that regard. She is having no pain today 03/08/18 She is here for follow up evaluation of a left great toe ulcer. She remains non-compliant with glucose control and smoking cessation; glucose levels consistently >200. She states that she got new shoe inserts/peg assist. She admits to compliance with offloading. Since my last evaluation there is significant improvement. We will switch to prisma at this time and she will follow up next week. She is noted to be tachycardic at this appointment, heart rate 120s; she has a history of heart rate 70-130  according to our records. She admits to extreme agitation r/t personal issues; she was advised to monitor her heartrate and contact her physician if it does not return to a more normal range (<100). She takes cardizem twice daily. 03/15/18-She is here in follow-up evaluation for left great toe ulcer. She remains noncompliant with glucose control and smoking cessation. She admits to compliance with wearing offloading shoe. The ulcer is improved/stable and we will continue with the same treatment plan and she will follow-up next week 03/22/18-She is here for evaluation for left great toe ulcer. There continues to be significant improvement despite recurrent hyperglycemia (over 500 yesterday) and she continues to smoke. She has been compliant with offloading and we will continue with same treatment plan and she will follow-up next week. 03/29/18-She is here for evaluation for left great toe ulcer. Despite continuing to smoke and uncontrolled diabetes she continues to improve. She is compliant with offloading shoe. We will continue with the same treatment plan and she will follow-up next week 04/05/18- She is here in follow up evaluation for a left great toe ulcer; she presents with small pustule to left fifth toe (resembles ant bite). She admits to compliance with wearing offloading shoe; continues to smoke or have uncontrolled blood glucose control. There is more callus than usual with evidence of bleeding; she denies known trauma. 04/12/18-She is here for evaluation of left great toe ulcer. Despite noncompliance with glycemic control and smoking she continues to make improvement. She continues to wear offloading shoe. The pustule, that was identified last week, to the left fifth toe is resolved. She will follow-up in 2 weeks 05/03/18-she is seen in follow-up evaluation for a left great toe ulcer. She is compliant with offloading, otherwise noncompliant  with glycemic control and smoking. She has plateaued  and there is minimal improvement noted. We will transition to [facility], replaced the insert to her surgical shoe and she will follow-up in one week 05/10/18- She is here in follow up evaluation for a left great toe ulcer. It appears stable despite measurement change. We will continue with same treatment plan and follow up next week. 05/24/18-She is seen in follow-up evaluation for a left great toe ulcer. She remains compliant with offloading, has made significant improvement in her diet, decreasing the amount of sugar/soda. She said her recent A1c was 10.9 which is lower than. She did see a diabetic nutritionist/educator yesterday. She continues to smoke. We will continue with the same treatment plan and she'll follow-up next week. 05/31/18- She is seen in follow-up evaluation for left great toe ulcer. She continues to remain compliant with offloading, continues to make improvement in her diet, increasing her water and decreasing the amount of sugar/soda. She does continue to smoke with no desire to quit. We will apply Prisma to the depth and Hydrofera Blue over. We have not received insurance authorization for oasis. She will follow up next week. 06/07/18-She is seen in follow-up evaluation for left great toe ulcer. It has stalled according to today's measurements although base appears stable. She says she saw a diabetic educator yesterday; her average blood sugars are less than 300 which is an improvement for her. She continues to smoke and states "that's my next step" She continues with water over soda. We will order for xray, culture and reinstate ace wrap compression prior to placing apligraf for next week. She is voicing no complaints or concerns. Her dressing will change to iodoflex over the next week in preparation for apligraf. 06/14/18-She is seen in follow-up evaluation for left great toe ulcer. Plain film x-ray performed last week was negative for osteomyelitis. Wound culture obtained  last week grew strep B and OSSA; she is initiated on keflex and cefdinir today; there is erythema to the toe which could be from ace wrap compression, she has a history of wrapping too tight and has has been encouraged to maintain ace wraps that we place today. We will hold off on application of apligraf today, will apply next week after antibiotic therapy has been initiated. She admits today that she has resumed taking a shower with her foot/toe submerged in water, she has been reminded to keep foot/toe out of the bath water. She will be seen in follow up next week 06/21/18-she is seen in follow-up evaluation for left great toe ulcer. She is tolerating antibiotic therapy with no GI disturbance. The wound is stable. Apligraf was applied today. She has been decreasing her smoking, only had 4 cigarettes yesterday and 1 today. She continues being more compliant in diabetic diet. She will follow-up next week for evaluation of site, if stable will remove at 2 weeks. 06/28/18- She is here in follow up evalution. Apligraf was placed last week, she states the dressing fell off on Tuesday and she was dressing with hydrofera blue. She is healed and will be discharged from the clinic today. She has been instructed to continue with smoking cessation, continue monitoring glucose levels, offloading for an additional 4 weeks and continue with Mcmann, Itzayanna A. ([id number]) hydrofera blue for additional two weeks for any possible microscopic opening. Readmission: 08/07/18 on evaluation today patient presents for reevaluation concerning the ulcer of her right great toe. She was previously discharged on 06/28/18 healed. Nonetheless she states that  this began to show signs of drainage she subsequently went to her primary care provider. Subsequently an x-ray was performed on 08/01/18 which was negative. The patient was also placed on antibiotics at that time. Fortunately they should have been effective for the infection.  Nonetheless she's been experiencing some improvement but still has a lot of drainage coming from the wound itself. 08/14/18 on evaluation today patient's wound actually does show signs of improvement in regard to the erythema at this point. She has completed the antibiotics. With that being said we did discuss the possibility of placing her in a total contact cast as of today although I think that I may want to give this just a little bit more time to ensure nothing recurrence as far as her infection is concerned. I do not want to put in the cast and risk infection at that time if things are not completely resolved. With that being said she is gonna require some debridement today. 08/21/18 on evaluation today patient actually appears to be doing okay in regard to her toe ulcer. She's been tolerating the dressing changes without complication. With that being said it does appear that she is ready and in fact I think it's appropriate for [place] to go ahead and initiate the total contact cast today. Nonetheless she will require some sharp debridement to prepare the wound for application. Overall I feel like things have been progressing well but we do need to do something to get this to close more readily. 08/24/18 patient seen today for reevaluation after having had the total contact cast applied on Tuesday. She seems to have done very well the wound appears to be doing great and overall I'm pleased with the progress that she's made. There were no abnormal areas of rubbing from the cast on her lower extremity. 08/30/18 on evaluation today patient actually appears to be completely healed in regard to her plantar toe ulcer. She tells me at this point she's been having a lot of issues with the cast. She almost fell a couple of times the state shall the step of her dog a couple times as well. This is been a very frustrating process for her other nonetheless she has completely healed the wound which is excellent  news. Overall there does not appear to be the evidence of infection at this time which is great news. 09/11/18 evaluation today patient presents for follow-up concerning her great toe ulcer on the left which has unfortunately reopened since I last saw her which was only a couple of weeks ago. Unfortunately she was not able to get in to get the shoe and potentially the AFO that's gonna be necessary due to her left foot drop. She continues with offloading shoe but this is not enough to prevent her from reopening it appears. When we last had her in the total contact cast she did well from a healing standpoint but unfortunately the wound reopened as soon as she came out of the cast within just a couple of weeks. 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 This is also part of the reason why she falls on a regular basis. I just do not believe that is gonna be safe for [place] to reinitiate the total contact cast as last time we  had this on she fell 3 times one week which is definitely not normal for her. 09/18/18 upon evaluation today the patient actually appears to be doing about the same in regard to her toe ulcer. She did not contact [facility] as I asked her to even though I had given her the prescription. In fact she actually states that she has no idea where the prescription is. She did apparently call [facility] and they told her that all she needed to do was bring the prescription in order to be able to be seen and work on getting the AFO for her left foot. With all that being said she still does not have an appointment and I'm not sure were things stand that regard. I will give her a new prescription today in order to contact them to get this set up. 09/25/18 on evaluation today patient actually appears to be  doing about the same in regard to her toes ulcer. She does have a small areas which seems to have a lot of callous buildup around the edge of the wound which is going to need sharp debridement today. She still is waiting to be scheduled for evaluation with [facility] for possibility of an AFO. She states there supposed to call her tomorrow to get this set up. Unfortunately it does appear that her foot specifically the toe area is showing signs of erythema. There does not appear to be any systemic infection which is in these good news. 10/02/18 on evaluation today patient actually appears to be doing about the same in regard to her toe ulcer. This really has not done too well although it's not significantly larger it's also not significantly smaller. She has been tolerating the dressing changes without complication. She actually has her appointment with [facility] and [facility] tomorrow to hopefully be measured for obtaining and AFO splint. I think this would be helpful preventing this from reoccurring. We had contemplated starting the cast this week although to be honest I am reluctant to do that as she's been having nausea, vomiting, and seizure activity over the past three days. She has a history of seizures and have been told is nothing that can be done for these. With Moxley, Anastasiya A. ([id number]) that being said I do believe that along with the seizures have the nausea vomiting which upon further questioning doesn't seem to be the normal for her and makes me concerned for the possibility of infection or something else going on. I discussed this with the patient and her mother during the office visit today. I do not feel the wound is effective but maybe something else. The responses this was "this just happens to her at times and we don't know why". They did not seem to be interested in going to the hospital to have this checked out further. 10/09/18 on evaluation today patient presents for follow-up  concerning her ongoing toe ulcer. She has been tolerating the dressing changes without complication. Fortunately there does not appear to be any evidence of infection which is great news however I do think that the patient would benefit from going ahead for with the total contact cast. She's actually in a wheelchair today she tells me that she will use her walker if we initiate the cast. I was very specific about the fact that if we were gonna do the cast I wanted to make sure that she was using the walker in order to prevent any falls. She tells me she does not have stairs that  she has to traverse on a regular basis at her home. She has not had any seizures since last week again that something that happens to her often she tells me she did talk to [employer] and they said that it may take up to three weeks to get the brace approved for her. Hopefully that will not take that long but nonetheless in the meantime I do think the cast could be of benefit. Electronic Signature(s) Signed: 10/09/2018 6:07:41 PM By: Worthy Keeler PA-C Entered By: Worthy Keeler on 10/09/2018 17:41:01 [name] ([id number]) -------------------------------------------------------------------------------- Physical Exam Details Patient Name: Heather Dear A. Date of Service: 10/09/2018 11:00 AM Medical Record Number: [id number] Patient Account Number: [id number] Date of Birth/Sex: 07-27-1976 (42 y.o. F) Treating RN: Montey Hora Primary Care Provider: Priscille Kluver Other Clinician: Referring Provider: Priscille Kluver Treating Provider/Extender: STONE III, HOYT Weeks in Treatment: 9 Constitutional Well-nourished and well-hydrated in no acute distress. Respiratory normal breathing without difficulty. Psychiatric this patient is able to make decisions and demonstrates good insight into disease process. Alert and Oriented x 3. pleasant and cooperative. Notes I did not have to really sharply debride much of the  patient's wound today just some callous around the edge of the wound. She tolerated this without complication and post debridement the wound bed appears to be doing much better which is great news. The total contact cast was then applied to the lower extremity which the patient tolerated without complication. Post application things seem to be doing excellent at this time. Electronic Signature(s) Signed: 10/09/2018 6:07:41 PM By: Worthy Keeler PA-C Entered By: Worthy Keeler on 10/09/2018 17:53:49 [name] ([id number]) -------------------------------------------------------------------------------- Physician Orders Details Patient Name: Heather Dear A. Date of Service: 10/09/2018 11:00 AM Medical Record Number: [id number] Patient Account Number: [id number] Date of Birth/Sex: [birth year] (42 y.o. 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 Primary Wound Dressing Wound #4R Left Toe Great o Silver Alginate Secondary Dressing Wound #4R Left Toe Great o Foam Dressing Change Frequency Wound #4R Left Toe Great o Other: - twice weekly Follow-up Appointments Wound #4R Left Toe Great o Other: - Friday 10/12/18 Off-Loading o Total Contact Cast to Left Lower Extremity o Other: - Referral to [facility] for AFO Electronic Signature(s) Signed:  10/09/2018 5:03:08 PM By: Montey Hora Signed: 10/09/2018 6:07:41 PM By: Worthy Keeler PA-C Entered By: Montey Hora on 10/09/2018 11:51:20 [name], [name] ([id number]) -------------------------------------------------------------------------------- Problem List Details Patient Name: Heather Dear A. Date of Service: 10/09/2018 11:00 AM Medical Record Number: [id number] Patient Account Number: [id number] Date of Birth/Sex: 1976-05-30 (42 y.o. 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 ([id number]) -------------------------------------------------------------------------------- Progress Note Details Patient Name: Heather Dear A. Date of Service: 10/09/2018 11:00 AM Medical Record Number: [id number] Patient Account Number: [id number] Date of Birth/Sex: January 02, 1976 (42 y.o. F) Treating RN: Montey Hora Primary Care Provider: Priscille Kluver Other Clinician: Referring Provider: Priscille Kluver Treating Provider/Extender: Melburn Hake, HOYT Weeks in Treatment: 9 Subjective Chief Complaint Information obtained from Patient She is here for evaluation of a left great toe ulcer History of Present Illness (HPI) 01/18/18-She is here for initial evaluation of the  left great toe ulcer. She is a poor historian in regards to timeframe in detail. She states approximately 4 weeks ago she lacerated her toe on something in the house. She followed up with her primary care who placed her on Bactrim and ultimately a second dose of Bactrim prior to coming to wound clinic. She states she has been treating the toe with peroxide, Betadine and a Band-Aid. She did not check her blood sugar this morning but checked it yesterday morning it was 327; she is unaware of a recent A1c and there are no current records. She saw Dr. she would've orthopedics last week for an old injury to the left ankle, she states he did not see her toe, nor did she bring it to his attention. She smokes approximately 1 pack cigarettes a day. Her social situation is concerning, she arrives this morning with her mother who appears extremely intoxicated/under the influence; her mother was asked to leave the room and be monitored by the patient's grandmother. The patient's aunt then accompanied the patient and the room throughout the rest of the appointment. We had a lengthy discussion regarding the deleterious effects of uncontrolled hyperglycemia and smoking as it relates to wound healing and overall health. She was strongly encouraged to decrease her smoking and get her diabetes under better control. She states she is currently on a diet and has cut down her [facility] consumption. The left toe is erythematous, macerated and slightly edematous with malodor present. The edema in her left foot is below her baseline, there is no erythema streaking. We will treat her with Santyl, doxycycline; we have ordered and xray, culture and provided a Peg assist surgical shoe and cultured the wound. 01/25/18-She is here in follow-up evaluation for a left great toe ulcer and presents with an abscess to her suprapubic area. She states her blood sugars remain elevated, feeling "sick" and if levels are below 250, but she  is trying. She has made no attempt to decrease her smoking stating that we "can't take away her food in her cigarettes". She has been compliant with offloading using the PEG assist you. She is using Santyl daily. the culture obtained last week grew staph aureus and Enterococcus faecalis; continues on the doxycycline and Augmentin was added on Monday. The suprapubic area has erythema, no femoral variation, purple discoloration, minimal induration, was accessed with a cotton tip applicator with sanguinopurulent drainage, this was cultured, I suspect the current antibiotic treatment will cover and we will not add anything to her current treatment plan. She was advised to go to urgent care or ER with any change in redness, induration or fever. 02/01/18-She is here in follow-up evaluation for left  great toe ulcers and a new abdominal abscess from last week. She was able to use packing until earlier this week, where she "forgot it was there". She states she was feeling ill with GI symptoms last week and was not taking her antibiotic. She states her glucose levels have been predominantly less than 200, with occasional levels between 200-250. She thinks this was contributing to her GI symptoms as they have resolved without intervention. There continues to be significant laceration to left toe, otherwise it clinically looks stable/improved. There is now less superficial opening to the lateral aspect of the great toe that was residual blister. We will transition to [facility] to all wounds, she will continue her Augmentin. If there is no change or deterioration next week for reculture. 02/08/18-She is here in follow-up evaluation for left great toe ulcer and abdominal ulcer. There is an improvement in both wounds. She has been wrapping her left toe with coban, not by our direction, which has created an area of discoloration to the medial aspect; she has been advised to NOT use coban secondary to her  neuropathy. She states her glucose levels have been high over this last week ranging from 200-350, she continues to smoke. She admits to being less compliant with her offloading shoe. We will continue with same treatment plan and she will follow-up next week. 02/15/18-She is here in follow-up evaluation for left great toe ulcer and abdominal ulcer. The abdominal ulcer is epithelialized. The left great toe ulcer is improved and all injury from last week using the Coban wrap is resolved, the lateral ulcer is healed. She admits to noncompliance with wearing offloading shoe and admits to glucose levels being greater than 300 most of the week. She continues to smoke and expresses no desire to quit. There is one area medially that probes deeper than it has historically, erythema to the toe and dorsal foot has consistently waxed and waned. There is no overt signs of cellulitis or infection but we will culture the wound for any occult infection given the new area of depth and erythema. We will hold off on Castorena, Paisleigh A. ([id number]) sensitivities for initiation of antibiotic therapy. 02/22/18-She is here in follow up evaluation for left great toe ulcer. There is overall significant improvement in both wound appearance, erythema and edema with changes made last week. She was not initiated on antibiotic therapy. Culture obtained last week showed oxacillin sensitive staph aureus, sensitive to clindamycin. Clindamycin has been called into the pharmacy but she has been instructed to hold off on initiation secondary to overall clinical improvement and her history of antibiotic intolerance. She has been instructed to contact the clinic with any noted changes/deterioration and the wound, erythema, edema and/or pain. She will follow-up next week. She continues to smoke and her glucose levels remain elevated >250; she admits to compliance with offloading shoe 03/01/18 on evaluation today patient appears to be doing  fairly well in regard to her left first toe ulcer. She has been tolerating the dressing changes with the [facility] Dressing without complication and overall this has definitely showed signs of improvement according to records as well is what the patient tells me today. I'm very pleased in that regard. She is having no pain today 03/08/18 She is here for follow up evaluation of a left great toe ulcer. She remains non-compliant with glucose control and smoking cessation; glucose levels consistently >200. She states that she got new shoe inserts/peg assist. She admits to compliance with offloading. Since  my last evaluation there is significant improvement. We will switch to prisma at this time and she will follow up next week. She is noted to be tachycardic at this appointment, heart rate 120s; she has a history of heart rate 70-130 according to our records. She admits to extreme agitation r/t personal issues; she was advised to monitor her heartrate and contact her physician if it does not return to a more normal range (<100). She takes cardizem twice daily. 03/15/18-She is here in follow-up evaluation for left great toe ulcer. She remains noncompliant with glucose control and smoking cessation. She admits to compliance with wearing offloading shoe. The ulcer is improved/stable and we will continue with the same treatment plan and she will follow-up next week 03/22/18-She is here for evaluation for left great toe ulcer. There continues to be significant improvement despite recurrent hyperglycemia (over 500 yesterday) and she continues to smoke. She has been compliant with offloading and we will continue with same treatment plan and she will follow-up next week. 03/29/18-She is here for evaluation for left great toe ulcer. Despite continuing to smoke and uncontrolled diabetes she continues to improve. She is compliant with offloading shoe. We will continue with the same treatment plan and she will  follow-up next week 04/05/18- She is here in follow up evaluation for a left great toe ulcer; she presents with small pustule to left fifth toe (resembles ant bite). She admits to compliance with wearing offloading shoe; continues to smoke or have uncontrolled blood glucose control. There is more callus than usual with evidence of bleeding; she denies known trauma. 04/12/18-She is here for evaluation of left great toe ulcer. Despite noncompliance with glycemic control and smoking she continues to make improvement. She continues to wear offloading shoe. The pustule, that was identified last week, to the left fifth toe is resolved. She will follow-up in 2 weeks 05/03/18-she is seen in follow-up evaluation for a left great toe ulcer. She is compliant with offloading, otherwise noncompliant with glycemic control and smoking. She has plateaued and there is minimal improvement noted. We will transition to [facility], replaced the insert to her surgical shoe and she will follow-up in one week 05/10/18- She is here in follow up evaluation for a left great toe ulcer. It appears stable despite measurement change. We will continue with same treatment plan and follow up next week. 05/24/18-She is seen in follow-up evaluation for a left great toe ulcer. She remains compliant with offloading, has made significant improvement in her diet, decreasing the amount of sugar/soda. She said her recent A1c was 10.9 which is lower than. She did see a diabetic nutritionist/educator yesterday. She continues to smoke. We will continue with the same treatment plan and she'll follow-up next week. 05/31/18- She is seen in follow-up evaluation for left great toe ulcer. She continues to remain compliant with offloading, continues to make improvement in her diet, increasing her water and decreasing the amount of sugar/soda. She does continue to smoke with no desire to quit. We will apply Prisma to the depth and Hydrofera Blue over.  We have not received insurance authorization for oasis. She will follow up next week. 06/07/18-She is seen in follow-up evaluation for left great toe ulcer. It has stalled according to today's measurements although base appears stable. She says she saw a diabetic educator yesterday; her average blood sugars are less than 300 which is an improvement for her. She continues to smoke and states "that's my next step" She continues with water  over soda. We will order for xray, culture and reinstate ace wrap compression prior to placing apligraf for next week. She is voicing no complaints or concerns. Her dressing will change to iodoflex over the next week in preparation for apligraf. 06/14/18-She is seen in follow-up evaluation for left great toe ulcer. Plain film x-ray performed last week was negative for osteomyelitis. Wound culture obtained last week grew strep B and OSSA; she is initiated on keflex and cefdinir today; there is erythema to the toe which could be from ace wrap compression, she has a history of wrapping too tight and has has been encouraged to maintain ace wraps that we place today. We will hold off on application of apligraf today, will apply next week after antibiotic therapy has been initiated. She admits today that she has resumed taking a shower with her foot/toe submerged in water, she has been reminded to keep foot/toe out of the bath water. She will be seen in follow up next week 06/21/18-she is seen in follow-up evaluation for left great toe ulcer. She is tolerating antibiotic therapy with no GI disturbance. The wound is stable. Apligraf was applied today. She has been decreasing her smoking, only had 4 cigarettes yesterday and Buntyn, Colbi A. ([id number]) 1 today. She continues being more compliant in diabetic diet. She will follow-up next week for evaluation of site, if stable will remove at 2 weeks. 06/28/18- She is here in follow up evalution. Apligraf was placed last week, she  states the dressing fell off on Tuesday and she was dressing with hydrofera blue. She is healed and will be discharged from the clinic today. She has been instructed to continue with smoking cessation, continue monitoring glucose levels, offloading for an additional 4 weeks and continue with hydrofera blue for additional two weeks for any possible microscopic opening. Readmission: 08/07/18 on evaluation today patient presents for reevaluation concerning the ulcer of her right great toe. She was previously discharged on 06/28/18 healed. Nonetheless she states that this began to show signs of drainage she subsequently went to her primary care provider. Subsequently an x-ray was performed on 08/01/18 which was negative. The patient was also placed on antibiotics at that time. Fortunately they should have been effective for the infection. Nonetheless she's been experiencing some improvement but still has a lot of drainage coming from the wound itself. 08/14/18 on evaluation today patient's wound actually does show signs of improvement in regard to the erythema at this point. She has completed the antibiotics. With that being said we did discuss the possibility of placing her in a total contact cast as of today although I think that I may want to give this just a little bit more time to ensure nothing recurrence as far as her infection is concerned. I do not want to put in the cast and risk infection at that time if things are not completely resolved. With that being said she is gonna require some debridement today. 08/21/18 on evaluation today patient actually appears to be doing okay in regard to her toe ulcer. She's been tolerating the dressing changes without complication. With that being said it does appear that she is ready and in fact I think it's appropriate for [place] to go ahead and initiate the total contact cast today. Nonetheless she will require some sharp debridement to prepare the wound for  application. Overall I feel like things have been progressing well but we do need to do something to get this to close more readily. 08/24/18  patient seen today for reevaluation after having had the total contact cast applied on Tuesday. She seems to have done very well the wound appears to be doing great and overall I'm pleased with the progress that she's made. There were no abnormal areas of rubbing from the cast on her lower extremity. 08/30/18 on evaluation today patient actually appears to be completely healed in regard to her plantar toe ulcer. She tells me at this point she's been having a lot of issues with the cast. She almost fell a couple of times the state shall the step of her dog a couple times as well. This is been a very frustrating process for her other nonetheless she has completely healed the wound which is excellent news. Overall there does not appear to be the evidence of infection at this time which is great news. 09/11/18 evaluation today patient presents for follow-up concerning her great toe ulcer on the left which has unfortunately reopened since I last saw her which was only a couple of weeks ago. Unfortunately she was not able to get in to get the shoe and potentially the AFO that's gonna be necessary due to her left foot drop. She continues with offloading shoe but this is not enough to prevent her from reopening it appears. When we last had her in the total contact cast she did well from a healing standpoint but unfortunately the wound reopened as soon as she came out of the cast within just a couple of weeks. 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 This is also part of the reason why she falls on a regular basis. I just do not believe that is gonna be safe for [place] to  reinitiate the total contact cast as last time we had this on she fell 3 times one week which is definitely not normal for her. 09/18/18 upon evaluation today the patient actually appears to be doing about the same in regard to her toe ulcer. She did not contact [facility] as I asked her to even though I had given her the prescription. In fact she actually states that she has no idea where the prescription is. She did apparently call [facility] and they told her that all she needed to do was bring the prescription in order to be able to be seen and work on getting the AFO for her left foot. With all that being said she still does not have an appointment and I'm not sure were things stand that regard. I will give her a new prescription today in order to contact them to get this set up. 09/25/18 on evaluation today patient actually appears to be doing about the same in regard to her toes ulcer. She does have a small areas which seems to have a lot of callous buildup around the edge of the wound which is going to need sharp debridement today. She still is waiting to be scheduled for evaluation with [facility] for possibility of an AFO. She states there supposed to call her tomorrow to get this set up. Unfortunately it does appear that her foot specifically the toe area is showing signs of erythema. There does not appear to be any systemic infection which is in these good news. [name], [name] ([id number]) 10/02/18 on evaluation  today patient actually appears to be doing about the same in regard to her toe ulcer. This really has not done too well although it's not significantly larger it's also not significantly smaller. She has been tolerating the dressing changes without complication. She actually has her appointment with [facility] tomorrow to hopefully be measured for obtaining and AFO splint. I think this would be helpful preventing this from reoccurring. We had contemplated starting the cast  this week although to be honest I am reluctant to do that as she's been having nausea, vomiting, and seizure activity over the past three days. She has a history of seizures and have been told is nothing that can be done for these. With that being said I do believe that along with the seizures have the nausea vomiting which upon further questioning doesn't seem to be the normal for her and makes me concerned for the possibility of infection or something else going on. I discussed this with the patient and her mother during the office visit today. I do not feel the wound is effective but maybe something else. The responses this was "this just happens to her at times and we don't know why". They did not seem to be interested in going to the hospital to have this checked out further. 10/09/18 on evaluation today patient presents for follow-up concerning her ongoing toe ulcer. She has been tolerating the dressing changes without complication. Fortunately there does not appear to be any evidence of infection which is great news however I do think that the patient would benefit from going ahead for with the total contact cast. She's actually in a wheelchair today she tells me that she will use her walker if we initiate the cast. I was very specific about the fact that if we were gonna do the cast I wanted to make sure that she was using the walker in order to prevent any falls. She tells me she does not have stairs that she has to traverse on a regular basis at her home. She has not had any seizures since last week again that something that happens to her often she tells me she did talk to [employer] and they said that it may take up to three weeks to get the brace approved for her. Hopefully that will not take that long but nonetheless in the meantime I do think the cast could be of benefit. Patient History Information obtained from Patient. Social History Current every day smoker. Review of Systems  (ROS) Constitutional Symptoms (General Health) Denies complaints or symptoms of Fever, Chills. Respiratory The patient has no complaints or symptoms. Cardiovascular The patient has no complaints or symptoms. Psychiatric The patient has no complaints or symptoms. Objective Constitutional Well-nourished and well-hydrated in no acute distress. Vitals Time Taken: 11:05 AM, Height: 69 in, Weight: 240 lbs, BMI: 35.4, Temperature: 99.3 F, Pulse: 83 bpm, Respiratory Rate: 16 breaths/min, Blood Pressure: 95/62 mmHg. Respiratory normal breathing without difficulty. [name], [name] ([id number]) Psychiatric this patient is able to make decisions and demonstrates good insight into disease process. Alert and Oriented x 3. pleasant and cooperative. General Notes: I did not have to really sharply debride much of the patient's wound today just some callous around the edge of the wound. She tolerated this without complication and post debridement the wound bed appears to be doing much better which is great news. The total contact cast was then applied to the lower extremity which the patient tolerated without complication. Post application  things seem to be doing excellent at this time. Integumentary (Hair, Skin) Wound #4R status is Open. Original cause of wound was Gradually Appeared. The wound is located on the Left Toe Great. The wound measures 1.2cm length x 0.4cm width x 0.4cm depth; 0.377cm^2 area and 0.151cm^3 volume. 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 There was a Selective/Open Wound Non-Viable Tissue Debridement with a total area of 0.48 sq cm performed by  STONE III, HOYT E., PA-C. With the following instrument(s): Curette to remove Non-Viable tissue/material. Material removed includes Callus after achieving pain control using Lidocaine 4% Topical Solution. No specimens were taken. A time out was conducted at 11:47, prior to the start of the procedure. There was no bleeding. The procedure was tolerated well with a pain level of 0 throughout and a pain level of 0 following the procedure. Post Debridement Measurements: 1.2cm length x 0.4cm width x 0.3cm depth; 0.113cm^3 volume. Character of Wound/Ulcer Post Debridement is improved. Severity of Tissue Post Debridement is: Fat layer exposed. Post procedure Diagnosis Wound #4R: Same as Pre-Procedure Pre-procedure diagnosis of Wound #4R is a Diabetic Wound/Ulcer of the Lower Extremity located on the Left Toe Great . There was a Total Contact Cast Procedure by STONE III, HOYT E., PA-C. Post procedure Diagnosis Wound #4R: Same as Pre-Procedure Plan Wound Cleansing: Wound #4R Left Toe Great: [name], [name] ([id number]) Cleanse wound with mild soap and water Anesthetic (add to Medication List): Wound #4R Left Toe Great: Topical Lidocaine 4% cream applied to wound bed prior to debridement (In Clinic Only). Primary Wound Dressing: Wound #4R Left Toe Great: Silver Alginate Secondary Dressing: Wound #4R Left Toe Great: Foam Dressing Change Frequency: Wound #4R Left Toe Great: Other: - twice weekly Follow-up Appointments: Wound #4R Left Toe Great: Other: - Friday 10/12/18 Off-Loading: Total Contact Cast to Left Lower Extremity Other: - Referral to [facility] We're gonna see her back for reevaluation in the end of the week to replace the cast ensure that everything is doing well still. Some school he will see were things stand at follow-up. Please see above for specific wound care orders. We will see patient for re-evaluation in 1 week(s) here in the clinic. If anything worsens  or changes patient will contact our office for additional recommendations. Electronic Signature(s) Signed: 10/09/2018 6:07:41 PM By: Worthy Keeler PA-C Entered By: Worthy Keeler on 10/09/2018 17:54:10 [name] ([id number]) -------------------------------------------------------------------------------- ROS/PFSH Details Patient Name: Heather Dear A. Date of Service: 10/09/2018 11:00 AM Medical Record Number: [id number] Patient Account Number: [id number] Date of Birth/Sex: [birth year] (42 y.o. F) Treating RN: Montey Hora Primary Care Provider: Priscille Kluver Other Clinician: Referring Provider: Priscille Kluver Treating Provider/Extender: STONE III, HOYT Weeks in Treatment: 9 Information Obtained From Patient Wound History Constitutional Symptoms (General Health) Complaints and Symptoms: Negative for: Fever; Chills Eyes Medical History: Positive for: Cataracts; Glaucoma; Optic Neuritis Ear/Nose/Mouth/Throat Medical History: Positive for: Chronic sinus problems/congestion; Middle ear problems Respiratory Complaints and Symptoms: No Complaints or Symptoms Cardiovascular Complaints and Symptoms: No Complaints or Symptoms Endocrine Medical History: Positive for: Type II Diabetes Time with diabetes: 10 years Treated with: Insulin, Oral agents Blood sugar tested every day: No Blood sugar  testing results: Lunch: 350 Psychiatric Complaints and Symptoms: No Complaints or Symptoms HBO Extended History Items Ear/Nose/Mouth/Throat: Eyes: Eyes: Ear/Nose/Mouth/Throat: Chronic sinus Cataracts Glaucoma Middle ear problems problems/congestion [name], [name] ([id number]) Implantable Devices Family and Social History Current every day smoker Physician Affirmation I have reviewed and agree with the above information. Electronic Signature(s) Signed: 10/09/2018 6:07:41 PM By: Worthy Keeler PA-C Signed: 10/10/2018 5:14:34 PM By: Montey Hora Entered By: Worthy Keeler on 10/09/2018 17:41:32 [name], [name] Kitchen ([id number]) -------------------------------------------------------------------------------- Total Contact Cast Details Patient Name: Heather Dear A. Date of Service: 10/09/2018 11:00 AM Medical Record Number: [id number] Patient Account Number: [id number] Date of Birth/Sex: November 09, 1975 (42 y.o. F) Treating RN: Montey Hora Primary Care Provider: Priscille Kluver Other Clinician: Referring Provider: Priscille Kluver Treating Provider/Extender: STONE III, HOYT Weeks in Treatment: 9 Total Contact Cast Applied for Wound Assessment: Wound #4R Left Toe Great Performed By: Physician Emilio Math., PA-C Post Procedure Diagnosis Same as Pre-procedure Electronic Signature(s) Signed: 10/09/2018 5:03:08 PM By: Montey Hora Signed: 10/09/2018 6:07:41 PM By: Worthy Keeler PA-C Entered By: Montey Hora on 10/09/2018 11:50:17 [name], [name] ([id number]) -------------------------------------------------------------------------------- SuperBill Details Patient Name: Heather Dear A. Date of Service: 10/09/2018 Medical Record Number: [id number] Patient Account Number: [id number] Date of Birth/Sex: Apr 11, 1976 (42 y.o. 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 Foot drop, left foot Facility Procedures CPT4 Code: [id number] Description: [phone] - DEBRIDE WOUND 1ST 20 SQ CM OR < ICD-10 Diagnosis Description L97.522 Non-pressure chronic ulcer of other part of left foot with fat Modifier: layer exposed Quantity: 1 Physician Procedures CPT4 Code: [id number] Description: 76734 - WC PHYS DEBR WO ANESTH 20 SQ CM ICD-10 Diagnosis  Description L97.522 Non-pressure chronic ulcer of other part of left foot with fat Modifier: layer exposed Quantity: 1 Electronic Signature(s) Signed: 10/09/2018 6:07:41 PM By: Worthy Keeler PA-C Entered By: Worthy Keeler on 10/09/2018 17:54:19

## 2018-10-12 ENCOUNTER — Encounter: Payer: Medicaid Other | Admitting: Physician Assistant

## 2018-10-12 DIAGNOSIS — E11621 Type 2 diabetes mellitus with foot ulcer: Secondary | ICD-10-CM | POA: Diagnosis not present

## 2018-10-14 NOTE — Progress Notes (Signed)
MILISSA, FESPERMAN ([id number]) Visit Report for 10/12/2018 Chief Complaint Document Details Patient Name: [name], [name] Date of Service: 10/12/2018 9:00 AM Medical Record Number: [id number] Patient Account Number: [id number] Date of Birth/Sex: 1976/01/24 (42 y.o. F) Treating RN: Montey Hora Primary Care Provider: Priscille Kluver Other Clinician: Referring Provider: Priscille Kluver Treating Provider/Extender: Melburn Hake, HOYT Weeks in Treatment: 9 Information Obtained from: Patient Chief Complaint She is here for evaluation of a left great toe ulcer Electronic Signature(s) Signed: 10/13/2018 12:35:32 AM By: Worthy Keeler PA-C Entered By: Worthy Keeler on 10/12/2018 08:58:27 [name] ([id number]) -------------------------------------------------------------------------------- HPI Details Patient Name: Heather Dear A. Date of Service: 10/12/2018 9:00 AM Medical Record Number: [id number] Patient Account Number: [id number] Date of Birth/Sex: 12-12-1975 (42 y.o. F) Treating RN: Montey Hora Primary Care Provider: Priscille Kluver Other Clinician: Referring Provider: Priscille Kluver Treating Provider/Extender: Melburn Hake, HOYT Weeks in Treatment: 9 History of Present Illness HPI Description: 01/18/18-She is here for initial evaluation of the left great toe ulcer. She is a poor historian in regards to timeframe in detail. She states approximately 4 weeks ago she lacerated her toe on something in the house. She followed up with her primary care who placed her on Bactrim and ultimately a second dose of Bactrim prior to coming to wound clinic. She states she has been treating the toe with peroxide, Betadine and a Band-Aid. She did not check her blood sugar this morning but checked it yesterday morning it was 327; she is unaware of a recent A1c and there are no current records. She saw Dr. she would've orthopedics last week for an old injury to the left ankle, she states he did not see her  toe, nor did she bring it to his attention. She smokes approximately 1 pack cigarettes a day. Her social situation is concerning, she arrives this morning with her mother who appears extremely intoxicated/under the influence; her mother was asked to leave the room and be monitored by the patient's grandmother. The patient's aunt then accompanied the patient and the room throughout the rest of the appointment. We had a lengthy discussion regarding the deleterious effects of uncontrolled hyperglycemia and smoking as it relates to wound healing and overall health. She was strongly encouraged to decrease her smoking and get her diabetes under better control. She states she is currently on a diet and has cut down her [facility] consumption. The left toe is erythematous, macerated and slightly edematous with malodor present. The edema in her left foot is below her baseline, there is no erythema streaking. We will treat her with Santyl, doxycycline; we have ordered and xray, culture and provided a Peg assist surgical shoe and cultured the wound. 01/25/18-She is here in follow-up evaluation for a left great toe ulcer and presents with an abscess to her suprapubic area. She states her blood sugars remain elevated, feeling "sick" and if levels are below 250, but she is trying. She has made no attempt to decrease her smoking stating that we "can't take away her food in her cigarettes". She has been compliant with offloading using the PEG assist you. She is using Santyl daily. the culture obtained last week grew staph aureus and Enterococcus faecalis; continues on the doxycycline and Augmentin was added on Monday. The suprapubic area has erythema, no femoral variation, purple discoloration, minimal induration, was accessed with a cotton tip applicator with sanguinopurulent drainage, this was cultured, I suspect the current antibiotic treatment will cover and we will not add anything to  her current treatment  plan. She was advised to go to urgent care or ER with any change in redness, induration or fever. 02/01/18-She is here in follow-up evaluation for left great toe ulcers and a new abdominal abscess from last week. She was able to use packing until earlier this week, where she "forgot it was there". She states she was feeling ill with GI symptoms last week and was not taking her antibiotic. She states her glucose levels have been predominantly less than 200, with occasional levels between 200-250. She thinks this was contributing to her GI symptoms as they have resolved without intervention. There continues to be significant laceration to left toe, otherwise it clinically looks stable/improved. There is now less superficial opening to the lateral aspect of the great toe that was residual blister. We will transition to [facility] to all wounds, she will continue her Augmentin. If there is no change or deterioration next week for reculture. 02/08/18-She is here in follow-up evaluation for left great toe ulcer and abdominal ulcer. There is an improvement in both wounds. She has been wrapping her left toe with coban, not by our direction, which has created an area of discoloration to the medial aspect; she has been advised to NOT use coban secondary to her neuropathy. She states her glucose levels have been high over this last week ranging from 200-350, she continues to smoke. She admits to being less compliant with her offloading shoe. We will continue with same treatment plan and she will follow-up next week. 02/15/18-She is here in follow-up evaluation for left great toe ulcer and abdominal ulcer. The abdominal ulcer is epithelialized. The left great toe ulcer is improved and all injury from last week using the Coban wrap is resolved, the lateral ulcer is healed. She admits to noncompliance with wearing offloading shoe and admits to glucose levels being greater than 300 most of the week. She continues  to smoke and expresses no desire to quit. There is one area medially that probes deeper than it has historically, erythema to the toe and dorsal foot has consistently waxed and waned. There is no overt signs of cellulitis or infection but we will culture the wound for any occult infection given the new area of depth and erythema. We will hold off on sensitivities for initiation of antibiotic therapy. 02/22/18-She is here in follow up evaluation for left great toe ulcer. There is overall significant improvement in both wound appearance, erythema and edema with changes made last week. She was not initiated on antibiotic therapy. Culture obtained last week showed oxacillin sensitive staph aureus, sensitive to clindamycin. Clindamycin has been called into the pharmacy but she has been instructed to hold off on initiation secondary to overall clinical improvement and her history of antibiotic intolerance. She has been instructed to contact the clinic with any noted changes/deterioration and the wound, erythema, Yankey, Raylie A. ([id number]) edema and/or pain. She will follow-up next week. She continues to smoke and her glucose levels remain elevated >250; she admits to compliance with offloading shoe 03/01/18 on evaluation today patient appears to be doing fairly well in regard to her left first toe ulcer. She has been tolerating the dressing changes with the [facility] Dressing without complication and overall this has definitely showed signs of improvement according to records as well is what the patient tells me today. I'm very pleased in that regard. She is having no pain today 03/08/18 She is here for follow up evaluation of a left great toe  ulcer. She remains non-compliant with glucose control and smoking cessation; glucose levels consistently >200. She states that she got new shoe inserts/peg assist. She admits to compliance with offloading. Since my last evaluation there is significant  improvement. We will switch to prisma at this time and she will follow up next week. She is noted to be tachycardic at this appointment, heart rate 120s; she has a history of heart rate 70-130 according to our records. She admits to extreme agitation r/t personal issues; she was advised to monitor her heartrate and contact her physician if it does not return to a more normal range (<100). She takes cardizem twice daily. 03/15/18-She is here in follow-up evaluation for left great toe ulcer. She remains noncompliant with glucose control and smoking cessation. She admits to compliance with wearing offloading shoe. The ulcer is improved/stable and we will continue with the same treatment plan and she will follow-up next week 03/22/18-She is here for evaluation for left great toe ulcer. There continues to be significant improvement despite recurrent hyperglycemia (over 500 yesterday) and she continues to smoke. She has been compliant with offloading and we will continue with same treatment plan and she will follow-up next week. 03/29/18-She is here for evaluation for left great toe ulcer. Despite continuing to smoke and uncontrolled diabetes she continues to improve. She is compliant with offloading shoe. We will continue with the same treatment plan and she will follow-up next week 04/05/18- She is here in follow up evaluation for a left great toe ulcer; she presents with small pustule to left fifth toe (resembles ant bite). She admits to compliance with wearing offloading shoe; continues to smoke or have uncontrolled blood glucose control. There is more callus than usual with evidence of bleeding; she denies known trauma. 04/12/18-She is here for evaluation of left great toe ulcer. Despite noncompliance with glycemic control and smoking she continues to make improvement. She continues to wear offloading shoe. The pustule, that was identified last week, to the left fifth toe is resolved. She will follow-up  in 2 weeks 05/03/18-she is seen in follow-up evaluation for a left great toe ulcer. She is compliant with offloading, otherwise noncompliant with glycemic control and smoking. She has plateaued and there is minimal improvement noted. We will transition to [facility], replaced the insert to her surgical shoe and she will follow-up in one week 05/10/18- She is here in follow up evaluation for a left great toe ulcer. It appears stable despite measurement change. We will continue with same treatment plan and follow up next week. 05/24/18-She is seen in follow-up evaluation for a left great toe ulcer. She remains compliant with offloading, has made significant improvement in her diet, decreasing the amount of sugar/soda. She said her recent A1c was 10.9 which is lower than. She did see a diabetic nutritionist/educator yesterday. She continues to smoke. We will continue with the same treatment plan and she'll follow-up next week. 05/31/18- She is seen in follow-up evaluation for left great toe ulcer. She continues to remain compliant with offloading, continues to make improvement in her diet, increasing her water and decreasing the amount of sugar/soda. She does continue to smoke with no desire to quit. We will apply Prisma to the depth and Hydrofera Blue over. We have not received insurance authorization for oasis. She will follow up next week. 06/07/18-She is seen in follow-up evaluation for left great toe ulcer. It has stalled according to today's measurements although base appears stable. She says she saw a diabetic  educator yesterday; her average blood sugars are less than 300 which is an improvement for her. She continues to smoke and states "that's my next step" She continues with water over soda. We will order for xray, culture and reinstate ace wrap compression prior to placing apligraf for next week. She is voicing no complaints or concerns. Her dressing will change to iodoflex over the next week  in preparation for apligraf. 06/14/18-She is seen in follow-up evaluation for left great toe ulcer. Plain film x-ray performed last week was negative for osteomyelitis. Wound culture obtained last week grew strep B and OSSA; she is initiated on keflex and cefdinir today; there is erythema to the toe which could be from ace wrap compression, she has a history of wrapping too tight and has has been encouraged to maintain ace wraps that we place today. We will hold off on application of apligraf today, will apply next week after antibiotic therapy has been initiated. She admits today that she has resumed taking a shower with her foot/toe submerged in water, she has been reminded to keep foot/toe out of the bath water. She will be seen in follow up next week 06/21/18-she is seen in follow-up evaluation for left great toe ulcer. She is tolerating antibiotic therapy with no GI disturbance. The wound is stable. Apligraf was applied today. She has been decreasing her smoking, only had 4 cigarettes yesterday and 1 today. She continues being more compliant in diabetic diet. She will follow-up next week for evaluation of site, if stable will remove at 2 weeks. 06/28/18- She is here in follow up evalution. Apligraf was placed last week, she states the dressing fell off on Tuesday and she was dressing with hydrofera blue. She is healed and will be discharged from the clinic today. She has been instructed to continue with smoking cessation, continue monitoring glucose levels, offloading for an additional 4 weeks and continue with Raben, Frances A. ([id number]) hydrofera blue for additional two weeks for any possible microscopic opening. Readmission: 08/07/18 on evaluation today patient presents for reevaluation concerning the ulcer of her right great toe. She was previously discharged on 06/28/18 healed. Nonetheless she states that this began to show signs of drainage she subsequently went to her primary care  provider. Subsequently an x-ray was performed on 08/01/18 which was negative. The patient was also placed on antibiotics at that time. Fortunately they should have been effective for the infection. Nonetheless she's been experiencing some improvement but still has a lot of drainage coming from the wound itself. 08/14/18 on evaluation today patient's wound actually does show signs of improvement in regard to the erythema at this point. She has completed the antibiotics. With that being said we did discuss the possibility of placing her in a total contact cast as of today although I think that I may want to give this just a little bit more time to ensure nothing recurrence as far as her infection is concerned. I do not want to put in the cast and risk infection at that time if things are not completely resolved. With that being said she is gonna require some debridement today. 08/21/18 on evaluation today patient actually appears to be doing okay in regard to her toe ulcer. She's been tolerating the dressing changes without complication. With that being said it does appear that she is ready and in fact I think it's appropriate for [place] to go ahead and initiate the total contact cast today. Nonetheless she will require some sharp debridement  to prepare the wound for application. Overall I feel like things have been progressing well but we do need to do something to get this to close more readily. 08/24/18 patient seen today for reevaluation after having had the total contact cast applied on Tuesday. She seems to have done very well the wound appears to be doing great and overall I'm pleased with the progress that she's made. There were no abnormal areas of rubbing from the cast on her lower extremity. 08/30/18 on evaluation today patient actually appears to be completely healed in regard to her plantar toe ulcer. She tells me at this point she's been having a lot of issues with the cast. She almost fell a  couple of times the state shall the step of her dog a couple times as well. This is been a very frustrating process for her other nonetheless she has completely healed the wound which is excellent news. Overall there does not appear to be the evidence of infection at this time which is great news. 09/11/18 evaluation today patient presents for follow-up concerning her great toe ulcer on the left which has unfortunately reopened since I last saw her which was only a couple of weeks ago. Unfortunately she was not able to get in to get the shoe and potentially the AFO that's gonna be necessary due to her left foot drop. She continues with offloading shoe but this is not enough to prevent her from reopening it appears. When we last had her in the total contact cast she did well from a healing standpoint but unfortunately the wound reopened as soon as she came out of the cast within just a couple of weeks. 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 This is also part of the reason why she falls on a regular basis. I just do not believe that is gonna be safe for [place] to reinitiate the total contact cast as last time we had this on she fell 3 times one week which is definitely not normal for her. 09/18/18 upon evaluation today the patient actually appears to be doing about the same in regard to her toe ulcer. She did not contact [facility] as I asked her to even though I had given her the prescription. In fact she actually states that she has no idea where the prescription is. She did apparently call [facility] and they told her that all she needed to do was bring the prescription in order to be able to be seen and work on getting the AFO for her left foot. With all that being said she still does not have an appointment  and I'm not sure were things stand that regard. I will give her a new prescription today in order to contact them to get this set up. 09/25/18 on evaluation today patient actually appears to be doing about the same in regard to her toes ulcer. She does have a small areas which seems to have a lot of callous buildup around the edge of the wound which is going to need sharp debridement today. She still is waiting to be scheduled for evaluation with [facility] for possibility of an AFO. She states there supposed to call her tomorrow to get this set up. Unfortunately it does appear that her foot  specifically the toe area is showing signs of erythema. There does not appear to be any systemic infection which is in these good news. 10/02/18 on evaluation today patient actually appears to be doing about the same in regard to her toe ulcer. This really has not done too well although it's not significantly larger it's also not significantly smaller. She has been tolerating the dressing changes without complication. She actually has her appointment with [facility] and [place] tomorrow to hopefully be measured for obtaining and AFO splint. I think this would be helpful preventing this from reoccurring. We had contemplated starting the cast this week although to be honest I am reluctant to do that as she's been having nausea, vomiting, and seizure activity over the past three days. She has a history of seizures and have been told is nothing that can be done for these. With Marik, Ondrea A. ([id number]) that being said I do believe that along with the seizures have the nausea vomiting which upon further questioning doesn't seem to be the normal for her and makes me concerned for the possibility of infection or something else going on. I discussed this with the patient and her mother during the office visit today. I do not feel the wound is effective but maybe something else. The responses this was "this just happens to  her at times and we don't know why". They did not seem to be interested in going to the hospital to have this checked out further. 10/09/18 on evaluation today patient presents for follow-up concerning her ongoing toe ulcer. She has been tolerating the dressing changes without complication. Fortunately there does not appear to be any evidence of infection which is great news however I do think that the patient would benefit from going ahead for with the total contact cast. She's actually in a wheelchair today she tells me that she will use her walker if we initiate the cast. I was very specific about the fact that if we were gonna do the cast I wanted to make sure that she was using the walker in order to prevent any falls. She tells me she does not have stairs that she has to traverse on a regular basis at her home. She has not had any seizures since last week again that something that happens to her often she tells me she did talk to [employer] and they said that it may take up to three weeks to get the brace approved for her. Hopefully that will not take that long but nonetheless in the meantime I do think the cast could be of benefit. 10/12/18 on evaluation today patient appears to be doing rather well in regard to her toe ulcer. It's just been a few days and already this is significantly improved both as far as overall appearance and size. Fortunately there's no sign of infection. She is here for her first obligatory cast change. Electronic Signature(s) Signed: 10/13/2018 12:35:32 AM By: Worthy Keeler PA-C Entered By: Worthy Keeler on 10/12/2018 09:11:50 [name] ([id number]) -------------------------------------------------------------------------------- Physical Exam Details Patient Name: Heather Dear A. Date of Service: 10/12/2018 9:00 AM Medical Record Number: [id number] Patient Account Number: [id number] Date of Birth/Sex: 1976-07-13 (42 y.o. F) Treating RN: Montey Hora Primary Care Provider: Priscille Kluver Other Clinician: Referring Provider: Priscille Kluver Treating Provider/Extender: STONE III, HOYT Weeks in Treatment: 9 Constitutional Well-nourished and well-hydrated in no acute distress. Respiratory normal breathing without difficulty. Psychiatric this patient is able to make decisions and demonstrates good  insight into disease process. Alert and Oriented x 3. pleasant and cooperative. Notes Patient's wound bed currently shows evidence of good granulation there's also good epithelialization around the edge of the wound overall this appears to be doing excellent at this time. I'm very pleased. Electronic Signature(s) Signed: 10/13/2018 12:35:32 AM By: Worthy Keeler PA-C Entered By: Worthy Keeler on 10/12/2018 09:12:20 [name] ([id number]) -------------------------------------------------------------------------------- Physician Orders Details Patient Name: Heather Dear A. Date of Service: 10/12/2018 9:00 AM Medical Record Number: [id number] Patient Account Number: [id number] Date of Birth/Sex: 1976-02-17 (42 y.o. 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 Primary Wound Dressing Wound #4R Left Toe Great o Silver Alginate Secondary Dressing Wound #4R Left Toe Great o Foam Dressing Change Frequency Wound #4R  Left Toe Great o Change dressing every week Follow-up Appointments Wound #4R Left Toe Great o Return Appointment in 1 week. Off-Loading o Total Contact Cast to Left Lower Extremity o Other: - Referral to [facility] and [facility] for AFO Electronic Signature(s) Signed: 10/12/2018 5:12:41 PM By: Montey Hora Signed: 10/13/2018 12:35:32 AM By: Worthy Keeler PA-C Entered By: Montey Hora on 10/12/2018 09:04:29 [name] ([id number]) -------------------------------------------------------------------------------- Problem List Details Patient Name: Heather Dear A. Date of Service: 10/12/2018 9:00 AM Medical Record Number: [id number] Patient Account Number: [id number] Date of Birth/Sex: [birth year] (42 y.o. 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 ([id number]) -------------------------------------------------------------------------------- Progress Note Details Patient Name: Heather Dear A. Date of Service: 10/12/2018 9:00 AM Medical Record Number: [id number] Patient Account Number: [id number] Date of Birth/Sex: 06/23/1976 (42 y.o. F) Treating RN: Montey Hora Primary Care Provider: Priscille Kluver Other Clinician: Referring Provider:  Priscille Kluver Treating Provider/Extender: Melburn Hake, HOYT Weeks in Treatment: 9 Subjective Chief Complaint Information obtained from Patient She is here for evaluation of a left great toe ulcer History of Present Illness (HPI) 01/18/18-She is here for initial evaluation of the left great toe ulcer. She is a poor historian in regards to timeframe in detail. She states approximately 4 weeks ago she lacerated her toe on something in the house. She followed up with her primary care who placed her on Bactrim and ultimately a second dose of Bactrim prior to coming to wound clinic. She states she has been treating the toe with peroxide, Betadine and a Band-Aid. She did not check her blood sugar this  morning but checked it yesterday morning it was 327; she is unaware of a recent A1c and there are no current records. She saw Dr. she would've orthopedics last week for an old injury to the left ankle, she states he did not see her toe, nor did she bring it to his attention. She smokes approximately 1 pack cigarettes a day. Her social situation is concerning, she arrives this morning with her mother who appears extremely intoxicated/under the influence; her mother was asked to leave the room and be monitored by the patient's grandmother. The patient's aunt then accompanied the patient and the room throughout the rest of the appointment. We had a lengthy discussion regarding the deleterious effects of uncontrolled hyperglycemia and smoking as it relates to wound healing and overall health. She was strongly encouraged to decrease her smoking and get her diabetes under better control. She states she is currently on a diet and has cut down her [facility] consumption. The left toe is erythematous, macerated and slightly edematous with malodor present. The edema in her left foot is below her baseline, there is no erythema streaking. We will treat her with Santyl, doxycycline; we have ordered and xray, culture and  provided a Peg assist surgical shoe and cultured the wound. 01/25/18-She is here in follow-up evaluation for a left great toe ulcer and presents with an abscess to her suprapubic area. She states her blood sugars remain elevated, feeling "sick" and if levels are below 250, but she is trying. She has made no attempt to decrease her smoking stating that we "can't take away her food in her cigarettes". She has been compliant with offloading using the PEG assist you. She is using Santyl daily. the culture obtained last week grew staph aureus and Enterococcus faecalis; continues on the doxycycline and Augmentin was added on Monday. The suprapubic area has erythema, no femoral variation, purple discoloration, minimal induration, was accessed with a cotton tip applicator with sanguinopurulent drainage, this was cultured, I suspect the current antibiotic treatment will cover and we will not add anything to her current treatment plan. She was advised to go to urgent care or ER with any change in redness, induration or fever. 02/01/18-She is here in follow-up evaluation for left great toe ulcers and a new abdominal abscess from last week. She was able to use packing until earlier this week, where she "forgot it was there". She states she was feeling ill with GI symptoms last week and was not taking her antibiotic. She states her glucose levels have been predominantly less than 200, with occasional levels between 200-250. She thinks this was contributing to her GI symptoms as they have resolved without intervention. There continues to be significant laceration to left toe, otherwise it clinically looks stable/improved. There is now less superficial opening to the lateral aspect of the great toe that was residual blister. We will transition to [facility] to all wounds, she will continue her Augmentin. If there is no change or deterioration next week for reculture. 02/08/18-She is here in follow-up evaluation  for left great toe ulcer and abdominal ulcer. There is an improvement in both wounds. She has been wrapping her left toe with coban, not by our direction, which has created an area of discoloration to the medial aspect; she has been advised to NOT use coban secondary to her neuropathy. She states her glucose levels have been high over this last week ranging from 200-350, she continues to smoke. She admits to being less compliant with  her offloading shoe. We will continue with same treatment plan and she will follow-up next week. 02/15/18-She is here in follow-up evaluation for left great toe ulcer and abdominal ulcer. The abdominal ulcer is epithelialized. The left great toe ulcer is improved and all injury from last week using the Coban wrap is resolved, the lateral ulcer is healed. She admits to noncompliance with wearing offloading shoe and admits to glucose levels being greater than 300 most of the week. She continues to smoke and expresses no desire to quit. There is one area medially that probes deeper than it has historically, erythema to the toe and dorsal foot has consistently waxed and waned. There is no overt signs of cellulitis or infection but we will culture the wound for any occult infection given the new area of depth and erythema. We will hold off on Wardle, Anayansi A. ([id number]) sensitivities for initiation of antibiotic therapy. 02/22/18-She is here in follow up evaluation for left great toe ulcer. There is overall significant improvement in both wound appearance, erythema and edema with changes made last week. She was not initiated on antibiotic therapy. Culture obtained last week showed oxacillin sensitive staph aureus, sensitive to clindamycin. Clindamycin has been called into the pharmacy but she has been instructed to hold off on initiation secondary to overall clinical improvement and her history of antibiotic intolerance. She has been instructed to contact the clinic with any  noted changes/deterioration and the wound, erythema, edema and/or pain. She will follow-up next week. She continues to smoke and her glucose levels remain elevated >250; she admits to compliance with offloading shoe 03/01/18 on evaluation today patient appears to be doing fairly well in regard to her left first toe ulcer. She has been tolerating the dressing changes with the [facility] Dressing without complication and overall this has definitely showed signs of improvement according to records as well is what the patient tells me today. I'm very pleased in that regard. She is having no pain today 03/08/18 She is here for follow up evaluation of a left great toe ulcer. She remains non-compliant with glucose control and smoking cessation; glucose levels consistently >200. She states that she got new shoe inserts/peg assist. She admits to compliance with offloading. Since my last evaluation there is significant improvement. We will switch to prisma at this time and she will follow up next week. She is noted to be tachycardic at this appointment, heart rate 120s; she has a history of heart rate 70-130 according to our records. She admits to extreme agitation r/t personal issues; she was advised to monitor her heartrate and contact her physician if it does not return to a more normal range (<100). She takes cardizem twice daily. 03/15/18-She is here in follow-up evaluation for left great toe ulcer. She remains noncompliant with glucose control and smoking cessation. She admits to compliance with wearing offloading shoe. The ulcer is improved/stable and we will continue with the same treatment plan and she will follow-up next week 03/22/18-She is here for evaluation for left great toe ulcer. There continues to be significant improvement despite recurrent hyperglycemia (over 500 yesterday) and she continues to smoke. She has been compliant with offloading and we will continue with same treatment plan and  she will follow-up next week. 03/29/18-She is here for evaluation for left great toe ulcer. Despite continuing to smoke and uncontrolled diabetes she continues to improve. She is compliant with offloading shoe. We will continue with the same treatment plan and she will follow-up next  week 04/05/18- She is here in follow up evaluation for a left great toe ulcer; she presents with small pustule to left fifth toe (resembles ant bite). She admits to compliance with wearing offloading shoe; continues to smoke or have uncontrolled blood glucose control. There is more callus than usual with evidence of bleeding; she denies known trauma. 04/12/18-She is here for evaluation of left great toe ulcer. Despite noncompliance with glycemic control and smoking she continues to make improvement. She continues to wear offloading shoe. The pustule, that was identified last week, to the left fifth toe is resolved. She will follow-up in 2 weeks 05/03/18-she is seen in follow-up evaluation for a left great toe ulcer. She is compliant with offloading, otherwise noncompliant with glycemic control and smoking. She has plateaued and there is minimal improvement noted. We will transition to [facility], replaced the insert to her surgical shoe and she will follow-up in one week 05/10/18- She is here in follow up evaluation for a left great toe ulcer. It appears stable despite measurement change. We will continue with same treatment plan and follow up next week. 05/24/18-She is seen in follow-up evaluation for a left great toe ulcer. She remains compliant with offloading, has made significant improvement in her diet, decreasing the amount of sugar/soda. She said her recent A1c was 10.9 which is lower than. She did see a diabetic nutritionist/educator yesterday. She continues to smoke. We will continue with the same treatment plan and she'll follow-up next week. 05/31/18- She is seen in follow-up evaluation for left great toe ulcer.  She continues to remain compliant with offloading, continues to make improvement in her diet, increasing her water and decreasing the amount of sugar/soda. She does continue to smoke with no desire to quit. We will apply Prisma to the depth and Hydrofera Blue over. We have not received insurance authorization for oasis. She will follow up next week. 06/07/18-She is seen in follow-up evaluation for left great toe ulcer. It has stalled according to today's measurements although base appears stable. She says she saw a diabetic educator yesterday; her average blood sugars are less than 300 which is an improvement for her. She continues to smoke and states "that's my next step" She continues with water over soda. We will order for xray, culture and reinstate ace wrap compression prior to placing apligraf for next week. She is voicing no complaints or concerns. Her dressing will change to iodoflex over the next week in preparation for apligraf. 06/14/18-She is seen in follow-up evaluation for left great toe ulcer. Plain film x-ray performed last week was negative for osteomyelitis. Wound culture obtained last week grew strep B and OSSA; she is initiated on keflex and cefdinir today; there is erythema to the toe which could be from ace wrap compression, she has a history of wrapping too tight and has has been encouraged to maintain ace wraps that we place today. We will hold off on application of apligraf today, will apply next week after antibiotic therapy has been initiated. She admits today that she has resumed taking a shower with her foot/toe submerged in water, she has been reminded to keep foot/toe out of the bath water. She will be seen in follow up next week 06/21/18-she is seen in follow-up evaluation for left great toe ulcer. She is tolerating antibiotic therapy with no GI disturbance. The wound is stable. Apligraf was applied today. She has been decreasing her smoking, only had 4 cigarettes  yesterday and Narain, Heavan A. ([id number]) 1  today. She continues being more compliant in diabetic diet. She will follow-up next week for evaluation of site, if stable will remove at 2 weeks. 06/28/18- She is here in follow up evalution. Apligraf was placed last week, she states the dressing fell off on Tuesday and she was dressing with hydrofera blue. She is healed and will be discharged from the clinic today. She has been instructed to continue with smoking cessation, continue monitoring glucose levels, offloading for an additional 4 weeks and continue with hydrofera blue for additional two weeks for any possible microscopic opening. Readmission: 08/07/18 on evaluation today patient presents for reevaluation concerning the ulcer of her right great toe. She was previously discharged on 06/28/18 healed. Nonetheless she states that this began to show signs of drainage she subsequently went to her primary care provider. Subsequently an x-ray was performed on 08/01/18 which was negative. The patient was also placed on antibiotics at that time. Fortunately they should have been effective for the infection. Nonetheless she's been experiencing some improvement but still has a lot of drainage coming from the wound itself. 08/14/18 on evaluation today patient's wound actually does show signs of improvement in regard to the erythema at this point. She has completed the antibiotics. With that being said we did discuss the possibility of placing her in a total contact cast as of today although I think that I may want to give this just a little bit more time to ensure nothing recurrence as far as her infection is concerned. I do not want to put in the cast and risk infection at that time if things are not completely resolved. With that being said she is gonna require some debridement today. 08/21/18 on evaluation today patient actually appears to be doing okay in regard to her toe ulcer. She's been tolerating  the dressing changes without complication. With that being said it does appear that she is ready and in fact I think it's appropriate for [place] to go ahead and initiate the total contact cast today. Nonetheless she will require some sharp debridement to prepare the wound for application. Overall I feel like things have been progressing well but we do need to do something to get this to close more readily. 08/24/18 patient seen today for reevaluation after having had the total contact cast applied on Tuesday. She seems to have done very well the wound appears to be doing great and overall I'm pleased with the progress that she's made. There were no abnormal areas of rubbing from the cast on her lower extremity. 08/30/18 on evaluation today patient actually appears to be completely healed in regard to her plantar toe ulcer. She tells me at this point she's been having a lot of issues with the cast. She almost fell a couple of times the state shall the step of her dog a couple times as well. This is been a very frustrating process for her other nonetheless she has completely healed the wound which is excellent news. Overall there does not appear to be the evidence of infection at this time which is great news. 09/11/18 evaluation today patient presents for follow-up concerning her great toe ulcer on the left which has unfortunately reopened since I last saw her which was only a couple of weeks ago. Unfortunately she was not able to get in to get the shoe and potentially the AFO that's gonna be necessary due to her left foot drop. She continues with offloading shoe but this is not enough to prevent  her from reopening it appears. When we last had her in the total contact cast she did well from a healing standpoint but unfortunately the wound reopened as soon as she came out of the cast within just a couple of weeks. Right now the biggest concern is that I do believe the foot drop is leading to the issue and  this is gonna continue to be an issue unfortunately until we get things under control as far as the walking anomaly is concerned with the foot drop. This is also part of the reason why she falls on a regular basis. I just do not believe that is gonna be safe for [place] to reinitiate the total contact cast as last time we had this on she fell 3 times one week which is definitely not normal for her. 09/18/18 upon evaluation today the patient actually appears to be doing about the same in regard to her toe ulcer. She did not contact [facility] as I asked her to even though I had given her the prescription. In fact she actually states that she has no idea where the prescription is. She did apparently call [facility] and they told her that all she needed to do was bring the prescription in order to be able to be seen and work on getting the AFO for her left foot. With all that being said she still does not have an appointment and I'm not sure were things stand that regard. I will give her a new prescription today in order to contact them to get this set up. 09/25/18 on evaluation today patient actually appears to be doing about the same in regard to her toes ulcer. She does have a small areas which seems to have a lot of callous buildup around the edge of the wound which is going to need sharp debridement today. She still is waiting to be scheduled for evaluation with [facility] for possibility of an AFO. She states there supposed to call her tomorrow to get this set up. Unfortunately it does appear that her foot specifically the toe area is showing signs of erythema. There does not appear to be any systemic infection which is in these good news. MATTESON, BLUE A. ([id number]) 10/02/18 on evaluation today patient actually appears to be doing about the same in regard to her toe ulcer. This really has not done too well although it's not significantly larger it's also not significantly smaller. She has been tolerating the  dressing changes without complication. She actually has her appointment with [facility] and [place] tomorrow to hopefully be measured for obtaining and AFO splint. I think this would be helpful preventing this from reoccurring. We had contemplated starting the cast this week although to be honest I am reluctant to do that as she's been having nausea, vomiting, and seizure activity over the past three days. She has a history of seizures and have been told is nothing that can be done for these. With that being said I do believe that along with the seizures have the nausea vomiting which upon further questioning doesn't seem to be the normal for her and makes me concerned for the possibility of infection or something else going on. I discussed this with the patient and her mother during the office visit today. I do not feel the wound is effective but maybe something else. The responses this was "this just happens to her at times and we don't know why". They did not seem to be interested in going to  the hospital to have this checked out further. 10/09/18 on evaluation today patient presents for follow-up concerning her ongoing toe ulcer. She has been tolerating the dressing changes without complication. Fortunately there does not appear to be any evidence of infection which is great news however I do think that the patient would benefit from going ahead for with the total contact cast. She's actually in a wheelchair today she tells me that she will use her walker if we initiate the cast. I was very specific about the fact that if we were gonna do the cast I wanted to make sure that she was using the walker in order to prevent any falls. She tells me she does not have stairs that she has to traverse on a regular basis at her home. She has not had any seizures since last week again that something that happens to her often she tells me she did talk to [employer] and they said that it may take up to three weeks  to get the brace approved for her. Hopefully that will not take that long but nonetheless in the meantime I do think the cast could be of benefit. 10/12/18 on evaluation today patient appears to be doing rather well in regard to her toe ulcer. It's just been a few days and already this is significantly improved both as far as overall appearance and size. Fortunately there's no sign of infection. She is here for her first obligatory cast change. Patient History Information obtained from Patient. Social History Current every day smoker. Review of Systems (ROS) Constitutional Symptoms (General Health) Denies complaints or symptoms of Fever, Chills. Respiratory The patient has no complaints or symptoms. Cardiovascular The patient has no complaints or symptoms. Psychiatric The patient has no complaints or symptoms. Objective Constitutional Well-nourished and well-hydrated in no acute distress. Vitals Time Taken: 8:48 AM, Height: 69 in, Weight: 240 lbs, BMI: 35.4, Temperature: 98.2 F, Pulse: 90 bpm, Respiratory Rate: 16 breaths/min, Blood Pressure: 100/58 mmHg. Noecker, [place] ([id number]) Respiratory normal breathing without difficulty. Psychiatric this patient is able to make decisions and demonstrates good insight into disease process. Alert and Oriented x 3. pleasant and cooperative. General Notes: Patient's wound bed currently shows evidence of good granulation there's also good epithelialization around the edge of the wound overall this appears to be doing excellent at this time. I'm very pleased. Integumentary (Hair, Skin) Wound #4R status is Open. Original cause of wound was Gradually Appeared. The wound is located on the Left Toe Great. The wound measures 0.5cm length x 0.4cm width x 0.2cm depth; 0.157cm^2 area and 0.031cm^3 volume. There is Fat Layer (Subcutaneous Tissue) Exposed exposed. There is no tunneling or undermining noted. There is a small amount of serous drainage  noted. The wound margin is epibole. There is large (67-100%) pink granulation within the wound bed. There is no necrotic tissue within the wound bed. The periwound skin appearance exhibited: Callus. 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 There was a Total Contact Cast Procedure by STONE III, HOYT E., PA-C. Post procedure Diagnosis Wound #4R: Same as Pre-Procedure Plan Wound Cleansing: Wound #4R Left Toe Great: Cleanse wound with mild soap and water Anesthetic (add to Medication List): Wound #4R Left Toe Great: Topical Lidocaine 4%  cream applied to wound bed prior to debridement (In Clinic Only). Primary Wound Dressing: Wound #4R Left Toe Great: Silver Alginate Palmateer, Alailah A. ([id number]) Secondary Dressing: Wound #4R Left Toe Great: Foam Dressing Change Frequency: Wound #4R Left Toe Great: Change dressing every week Follow-up Appointments: Wound #4R Left Toe Great: Return Appointment in 1 week. Off-Loading: Total Contact Cast to Left Lower Extremity Other: - Referral to [facility] and [facility] for AFO I'm gonna recommend currently that we go ahead and reapply the total contact cast this was applied by myself during the office visit today. The patient seem to tolerate this fairly well there was some rubbing on the medial aspect of her ankle we will pad this area a little bit more for her. She also mentioned that it felt like her leg got somewhat swollen. For that reason I am going to suggest as well that if she starts to feel that if she raises her leg above heart level released at heart level such as laying on the couch. She understands. She will continue to utilize the walker if she's moving around and  otherwise wheelchair to get around. Please see above for specific wound care orders. We will see patient for re-evaluation in 1 week(s) here in the clinic. If anything worsens or changes patient will contact our office for additional recommendations. Electronic Signature(s) Signed: 10/13/2018 12:35:32 AM By: Worthy Keeler PA-C Entered By: Worthy Keeler on 10/12/2018 09:13:58 [name] ([id number]) -------------------------------------------------------------------------------- ROS/PFSH Details Patient Name: Heather Dear A. Date of Service: 10/12/2018 9:00 AM Medical Record Number: [id number] Patient Account Number: [id number] Date of Birth/Sex: 1976/09/20 (42 y.o. F) Treating RN: Montey Hora Primary Care Provider: Priscille Kluver Other Clinician: Referring Provider: Priscille Kluver Treating Provider/Extender: STONE III, HOYT Weeks in Treatment: 9 Information Obtained From Patient Wound History Constitutional Symptoms (General Health) Complaints and Symptoms: Negative for: Fever; Chills Eyes Medical History: Positive for: Cataracts; Glaucoma; Optic Neuritis Ear/Nose/Mouth/Throat Medical History: Positive for: Chronic sinus problems/congestion; Middle ear problems Respiratory Complaints and Symptoms: No Complaints or Symptoms Cardiovascular Complaints and Symptoms: No Complaints or Symptoms Endocrine Medical History: Positive for: Type II Diabetes Time with diabetes: 10 years Treated with: Insulin, Oral agents Blood sugar tested every day: No Blood sugar testing results: Lunch: 350 Psychiatric Complaints and Symptoms: No Complaints or Symptoms HBO Extended History Items Ear/Nose/Mouth/Throat: Eyes: Eyes: Ear/Nose/Mouth/Throat: Chronic sinus Cataracts Glaucoma Middle ear problems problems/congestion Jantz, Danicia A. ([id number]) Implantable Devices Family and Social History Current every day smoker Physician Affirmation I have reviewed and agree with  the above information. Electronic Signature(s) Signed: 10/12/2018 5:12:41 PM By: Montey Hora Signed: 10/13/2018 12:35:32 AM By: Worthy Keeler PA-C Entered By: Worthy Keeler on 10/12/2018 09:12:09 Vahle, Lavra AMarland Kitchen ([id number]) -------------------------------------------------------------------------------- Total Contact Cast Details Patient Name: Heather Dear A. Date of Service: 10/12/2018 9:00 AM Medical Record Number: [id number] Patient Account Number: [id number] Date of Birth/Sex: [birth year] (42 y.o. F) Treating RN: Montey Hora Primary Care Provider: Priscille Kluver Other Clinician: Referring Provider: Priscille Kluver Treating Provider/Extender: STONE III, HOYT Weeks in Treatment: 9 Total Contact Cast Applied for Wound Assessment: Wound #4R Left Toe Great Performed By: Physician Emilio Math., PA-C Post Procedure Diagnosis Same as Pre-procedure Electronic Signature(s) Signed: 10/13/2018 12:35:32 AM By: Worthy Keeler PA-C Entered By: Worthy Keeler on 10/12/2018 09:13:47 Schaner, Britton A. ([id number]) -------------------------------------------------------------------------------- SuperBill Details Patient Name: Heather Dear A. Date of Service: 10/12/2018 Medical Record Number: [id number] Patient Account Number: [id number] Date of Birth/Sex: [birth year] (42 y.o. 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, left foot Facility Procedures CPT4 Code: 69794801 Description: [id number] - APPLY TOTAL CONTACT LEG CAST ICD-10 Diagnosis Description L97.522 Non-pressure chronic ulcer of other part of left foot with fat Modifier: layer  exposed Quantity: 1 Physician Procedures CPT4 Code: 4827078 Description: 67544 - WC PHYS APPLY TOTAL CONTACT CAST ICD-10 Diagnosis Description L97.522 Non-pressure chronic ulcer of other part of left foot with fat Modifier: layer exposed Quantity: 1 Electronic Signature(s) Signed: 10/13/2018 12:35:32 AM By: Worthy Keeler PA-C Entered By: Worthy Keeler on 10/12/2018 09:13:29

## 2018-10-14 NOTE — Progress Notes (Signed)
[name], [name] ([id number]) Visit Report for 10/12/2018 Arrival Information Details Patient Name: [name], [name] Date of Service: 10/12/2018 9:00 AM Medical Record Number: [id number] Patient Account Number: [id number] Date of Birth/Sex: [birth year] (42 y.o. F) Treating RN: Montey Hora Primary Care Arella Blinder: Priscille Kluver Other Clinician: Referring Neema Barreira: Priscille Kluver Treating Raylyn Carton/Extender: Melburn Hake, HOYT Weeks in Treatment: 9 Visit Information History Since Last Visit Added or deleted any medications: No Patient Arrived: Wheel Chair Any new allergies or adverse reactions: No Arrival Time: 08:48 Had a fall or experienced change in No activities of daily living that may affect Accompanied By: mother risk of falls: Transfer Assistance: None Signs or symptoms of abuse/neglect since last visito No Patient Identification Verified: Yes Hospitalized since last visit: No Secondary Verification Process Completed: Yes Implantable device outside of the clinic excluding No Patient Requires Transmission-Based No cellular tissue based products placed in the center Precautions: since last visit: Patient Has Alerts: No Has Dressing in Place as Prescribed: Yes Pain Present Now: No Electronic Signature(s) Signed: 10/12/2018 11:54:57 AM By: Lorine Bears RCP, RRT, CHT Entered By: Lorine Bears on 10/12/2018 08:48:35 [name] ([id number]) -------------------------------------------------------------------------------- Encounter Discharge Information Details Patient Name: Heather Dear A. Date of Service: 10/12/2018 9:00 AM Medical Record Number: [id number] Patient Account Number: [id number] Date of Birth/Sex: 07/13/1976 (42 y.o. F) Treating RN: Montey Hora Primary Care Aksel Bencomo: Priscille Kluver Other Clinician: Referring Modene Andy: Priscille Kluver Treating Evynn Boutelle/Extender: Melburn Hake, HOYT Weeks in Treatment: 9 Encounter Discharge Information  Items Discharge Condition: Stable Ambulatory Status: Wheelchair Discharge Destination: Home Transportation: Private Auto Accompanied By: mother Schedule Follow-up Appointment: Yes Clinical Summary of Care: Electronic Signature(s) Signed: 10/12/2018 9:28:02 AM By: Montey Hora Entered By: Montey Hora on 10/12/2018 09:28:02 [name] ([id number]) -------------------------------------------------------------------------------- Lower Extremity Assessment Details Patient Name: Heather Dear A. Date of Service: 10/12/2018 9:00 AM Medical Record Number: [id number] Patient Account Number: [id number] Date of Birth/Sex: 1976/09/16 (42 y.o. F) Treating RN: Cornell Barman Primary Care Shawnna Pancake: Priscille Kluver Other Clinician: Referring Mustaf Antonacci: Priscille Kluver Treating Paddy Neis/Extender: Melburn Hake, HOYT Weeks in Treatment: 9 Vascular Assessment Pulses: Dorsalis Pedis Palpable: [Left:Yes] Posterior Tibial Extremity colors, hair growth, and conditions: Extremity Color: [Left:Normal] Hair Growth on Extremity: [Left:Yes] Temperature of Extremity: [Left:Warm] Capillary Refill: [Left:< 3 seconds] Toe Nail Assessment Left: Right: Thick: No Discolored: No Deformed: No Improper Length and Hygiene: No Electronic Signature(s) Signed: 10/12/2018 4:41:19 PM By: Gretta Cool, BSN, RN, CWS, [name] RN, BSN Entered By: Gretta Cool, BSN, RN, CWS, [name] on 10/12/2018 08:59:17 [name] ([id number]) -------------------------------------------------------------------------------- Multi Wound Chart Details Patient Name: Heather Dear A. Date of Service: 10/12/2018 9:00 AM Medical Record Number: [id number] Patient Account Number: [id number] Date of Birth/Sex: 10/15/1976 (42 y.o. F) Treating RN: Montey Hora Primary Care Reilly Blades: Priscille Kluver Other Clinician: Referring Jacquline Terrill: Priscille Kluver Treating Tyaisha Cullom/Extender: STONE III, HOYT Weeks in Treatment: 9 Vital Signs Height(in): 69 Pulse(bpm):  90 Weight(lbs): 240 Blood Pressure(mmHg): 100/58 Body Mass Index(BMI): 35 Temperature(F): 98.2 Respiratory Rate 16 (breaths/min): Photos: [4R:No Photos] [N/A:N/A] Wound Location: [4R:Left Toe Great] [N/A:N/A] Wounding Event: [4R:Gradually Appeared] [N/A:N/A] Primary Etiology: [4R:Diabetic Wound/Ulcer of the N/A Lower Extremity] Comorbid History: [4R:Cataracts, Glaucoma, Optic Neuritis, Chronic sinus problems/congestion, Middle ear problems, Type II Diabetes] [N/A:N/A] Date Acquired: [4R:07/24/2018] [N/A:N/A] Weeks of Treatment: [4R:9] [N/A:N/A] Wound Status: [4R:Open] [N/A:N/A] Wound Recurrence: [4R:Yes] [N/A:N/A] Pending Amputation on [4R:Yes] [N/A:N/A] Presentation: Measurements L x W x D [4R:0.5x0.4x0.2] [N/A:N/A] (cm) Area (cm) : [4R:0.157] [N/A:N/A] Volume (cm) : [4R:0.031] [N/A:N/A] % Reduction in Area: [4R:89.90%] [N/A:N/A] % Reduction in  Volume: [4R:80.10%] [N/A:N/A] Classification: [4R:Grade 2] [N/A:N/A] Exudate Amount: [4R:Small] [N/A:N/A] Exudate Type: [4R:Serous] [N/A:N/A] Exudate Color: [4R:amber] [N/A:N/A] Wound Margin: [4R:Epibole] [N/A:N/A] Granulation Amount: [4R:Large (67-100%)] [N/A:N/A] Granulation Quality: [4R:Pink] [N/A:N/A] Necrotic Amount: [4R:None Present (0%)] [N/A:N/A] Exposed Structures: [4R:Fat Layer (Subcutaneous Tissue) Exposed: Yes Fascia: No Tendon: No Muscle: No Joint: No Bone: No] [N/A:N/A] Epithelialization: [4R:Medium (34-66%)] [N/A:N/A] Periwound Skin Texture: Callus: Yes N/A N/A Periwound Skin Moisture: No Abnormalities Noted N/A N/A Periwound Skin Color: No Abnormalities Noted N/A N/A Tenderness on Palpation: No N/A N/A Wound Preparation: Ulcer Cleansing: N/A N/A Rinsed/Irrigated with Saline Topical Anesthetic Applied: Other: lidocaine 4% Procedures Performed: Total Contact Cast N/A N/A Treatment Notes Electronic Signature(s) Signed: 10/12/2018 9:24:39 AM By: Montey Hora Entered By: Montey Hora on 10/12/2018  09:24:39 [name] ([id number]) -------------------------------------------------------------------------------- Multi-Disciplinary Care Plan Details Patient Name: Heather Dear A. Date of Service: 10/12/2018 9:00 AM Medical Record Number: [id number] Patient Account Number: [id number] Date of Birth/Sex: 06/19/1976 (42 y.o. F) Treating RN: Montey Hora Primary Care Srijan Givan: Priscille Kluver Other Clinician: Referring Bradrick Kamau: Priscille Kluver Treating Norlene Lanes/Extender: Melburn Hake, HOYT Weeks in Treatment: 9 Active Inactive Abuse / Safety / Falls / Self Care Management Nursing Diagnoses: History of Falls Goals: Patient will not experience any injury related to falls Date Initiated: 08/07/2018 Target Resolution Date: 10/19/2018 Goal Status: Active Interventions: Assess fall risk on admission and as needed Notes: Orientation to the Wound Care Program Nursing Diagnoses: Knowledge deficit related to the wound healing center program Goals: Patient/caregiver will verbalize understanding of the [place] Program Date Initiated: 08/07/2018 Target Resolution Date: 10/19/2018 Goal Status: Active Interventions: Provide education on orientation to the wound center Notes: Soft Tissue Infection Nursing Diagnoses: Impaired tissue integrity Goals: Patient will remain free of wound infection Date Initiated: 08/07/2018 Target Resolution Date: 10/19/2018 Goal Status: Active Interventions: Assess signs and symptoms of infection every visit [name], [name] ([id number]) Notes: Wound/Skin Impairment Nursing Diagnoses: Impaired tissue integrity Goals: Ulcer/skin breakdown will heal within 14 weeks Date Initiated: 08/07/2018 Target Resolution Date: 10/19/2018 Goal Status: Active Interventions: Assess patient/caregiver ability to obtain necessary supplies Notes: Electronic Signature(s) Signed: 10/12/2018 9:24:32 AM By: Montey Hora Entered By: Montey Hora on  10/12/2018 09:24:32 [name], [name] ([id number]) -------------------------------------------------------------------------------- Pain Assessment Details Patient Name: Heather Dear A. Date of Service: 10/12/2018 9:00 AM Medical Record Number: [id number] Patient Account Number: [id number] Date of Birth/Sex: 1976/08/16 (42 y.o. F) Treating RN: Montey Hora Primary Care Nyshaun Standage: Priscille Kluver Other Clinician: Referring Sun Kihn: Priscille Kluver Treating Zyanya Glaza/Extender: STONE III, HOYT Weeks in Treatment: 9 Active Problems Location of Pain Severity and Description of Pain Patient Has Paino No Site Locations Pain Management and Medication Current Pain Management: Electronic Signature(s) Signed: 10/12/2018 11:54:57 AM By: Lorine Bears RCP, RRT, CHT Signed: 10/12/2018 5:12:41 PM By: Montey Hora Entered By: Lorine Bears on 10/12/2018 08:48:42 [name] ([id number]) -------------------------------------------------------------------------------- Patient/Caregiver Education Details Patient Name: Heather Dear A. Date of Service: 10/12/2018 9:00 AM Medical Record Number: [id number] Patient Account Number: [id number] Date of Birth/Gender: February 05, 1976 (42 y.o. F) Treating RN: Montey Hora Primary Care Physician: Priscille Kluver Other Clinician: Referring Physician: Priscille Kluver Treating Physician/Extender: Sharalyn Ink in Treatment: 9 Education Assessment Education Provided To: Patient and Caregiver Education Topics Provided Offloading: Handouts: Other: TCC precautions Methods: Explain/Verbal Responses: State content correctly Electronic Signature(s) Signed: 10/12/2018 5:12:41 PM By: Montey Hora Entered By: Montey Hora on 10/12/2018 09:25:13 [name] ([id number]) -------------------------------------------------------------------------------- Wound Assessment Details Patient Name: Heather Dear A. Date of Service:  10/12/2018 9:00 AM  Medical Record Number: [id number] Patient Account Number: [id number] Date of Birth/Sex: [birth year] (42 y.o. F) Treating RN: Cornell Barman Primary Care Vilda Zollner: Priscille Kluver Other Clinician: Referring Farah Benish: Priscille Kluver Treating Vickie Ponds/Extender: STONE III, HOYT Weeks in Treatment: 9 Wound Status Wound Number: 4R Primary Diabetic Wound/Ulcer of the Lower Extremity Etiology: Wound Location: Left Toe Great Wound Open Wounding Event: Gradually Appeared Status: Date Acquired: 07/24/2018 Comorbid Cataracts, Glaucoma, Optic Neuritis, Chronic Weeks Of Treatment: 9 History: sinus problems/congestion, Middle ear problems, Clustered Wound: No Type II Diabetes Pending Amputation On Presentation Wound Measurements Length: (cm) 0.5 Width: (cm) 0.4 Depth: (cm) 0.2 Area: (cm) 0.157 Volume: (cm) 0.031 % Reduction in Area: 89.9% % Reduction in Volume: 80.1% Epithelialization: Medium (34-66%) Tunneling: No Undermining: No Wound Description Classification: Grade 2 Wound Margin: Epibole Exudate Amount: Small Exudate Type: Serous Exudate Color: amber Foul Odor After Cleansing: No Slough/Fibrino No Wound Bed Granulation Amount: Large (67-100%) Exposed Structure Granulation Quality: Pink Fascia Exposed: No Necrotic Amount: None Present (0%) Fat Layer (Subcutaneous Tissue) Exposed: Yes Tendon Exposed: No Muscle Exposed: No Joint Exposed: No Bone Exposed: No Periwound Skin Texture Texture Color No Abnormalities Noted: No No Abnormalities Noted: No Callus: Yes Moisture No Abnormalities Noted: No Wound Preparation Ulcer Cleansing: Rinsed/Irrigated with Saline Topical Anesthetic Applied: Other: lidocaine 4%, Treatment Notes [name], [name] ([id number]) Wound #4R (Left Toe Great) Notes silvercel, foam, TCC Electronic Signature(s) Signed: 10/12/2018 4:41:19 PM By: Gretta Cool, BSN, RN, CWS, [name] RN, BSN Entered By: Gretta Cool, BSN, RN, CWS, [name] on 10/12/2018  08:58:46 [name] ([id number]) -------------------------------------------------------------------------------- Vitals Details Patient Name: Heather Dear A. Date of Service: 10/12/2018 9:00 AM Medical Record Number: [id number] Patient Account Number: [id number] Date of Birth/Sex: [birth year] (42 y.o. F) Treating RN: Montey Hora Primary Care Braylon Grenda: Priscille Kluver Other Clinician: Referring Zarius Furr: Priscille Kluver Treating Aldrick Derrig/Extender: STONE III, HOYT Weeks in Treatment: 9 Vital Signs Time Taken: 08:48 Temperature (F): 98.2 Height (in): 69 Pulse (bpm): 90 Weight (lbs): 240 Respiratory Rate (breaths/min): 16 Body Mass Index (BMI): 35.4 Blood Pressure (mmHg): 100/58 Reference Range: 80 - 120 mg / dl Electronic Signature(s) Signed: 10/12/2018 11:54:57 AM By: Lorine Bears RCP, RRT, CHT Entered By: Lorine Bears on 10/12/2018 08:53:52

## 2018-10-16 ENCOUNTER — Encounter: Payer: Medicaid Other | Admitting: Physician Assistant

## 2018-10-18 DIAGNOSIS — F5105 Insomnia due to other mental disorder: Secondary | ICD-10-CM | POA: Insufficient documentation

## 2018-10-18 DIAGNOSIS — Z638 Other specified problems related to primary support group: Secondary | ICD-10-CM | POA: Insufficient documentation

## 2018-10-19 ENCOUNTER — Encounter: Payer: Medicaid Other | Admitting: Family Medicine

## 2018-10-19 DIAGNOSIS — E11621 Type 2 diabetes mellitus with foot ulcer: Secondary | ICD-10-CM | POA: Diagnosis not present

## 2018-10-21 NOTE — Progress Notes (Signed)
TRISTEN, PENNINO ([id number]) Visit Report for 10/19/2018 Chief Complaint Document Details Patient Name: [name], [name] Date of Service: 10/19/2018 8:00 AM Medical Record Number: [id number] Patient Account Number: [id number] Date of Birth/Sex: [birth year] (42 y.o. F) Treating RN: Cornell Barman Primary Care Provider: Priscille Kluver Other Clinician: Referring Provider: Priscille Kluver Treating Provider/Extender: Beather Arbour Weeks in Treatment: 10 Information Obtained from: Patient Chief Complaint Left great toe ulcer Electronic Signature(s) Signed: 10/21/2018 12:42:22 AM By: Beather Arbour FNP-C Entered By: Beather Arbour on 10/19/2018 09:25:00 [name] ([id number]) -------------------------------------------------------------------------------- HPI Details Patient Name: Heather Dear A. Date of Service: 10/19/2018 8:00 AM Medical Record Number: [id number] Patient Account Number: [id number] Date of Birth/Sex: June 24, 1976 (42 y.o. F) Treating RN: Cornell Barman Primary Care Provider: Priscille Kluver Other Clinician: Referring Provider: Priscille Kluver Treating Provider/Extender: Oneida Arenas in Treatment: 10 History of Present Illness HPI Description: 01/18/18-She is here for initial evaluation of the left great toe ulcer. She is a poor historian in regards to timeframe in detail. She states approximately 4 weeks ago she lacerated her toe on something in the house. She followed up with her primary care who placed her on Bactrim and ultimately a second dose of Bactrim prior to coming to wound clinic. She states she has been treating the toe with peroxide, Betadine and a Band-Aid. She did not check her blood sugar this morning but checked it yesterday morning it was 327; she is unaware of a recent A1c and there are no current records. She saw Dr. she would've orthopedics last week for an old injury to the left ankle, she states he did not see her toe, nor did she bring it to his  attention. She smokes approximately 1 pack cigarettes a day. Her social situation is concerning, she arrives this morning with her mother who appears extremely intoxicated/under the influence; her mother was asked to leave the room and be monitored by the patient's grandmother. The patient's aunt then accompanied the patient and the room throughout the rest of the appointment. We had a lengthy discussion regarding the deleterious effects of uncontrolled hyperglycemia and smoking as it relates to wound healing and overall health. She was strongly encouraged to decrease her smoking and get her diabetes under better control. She states she is currently on a diet and has cut down her [facility] consumption. The left toe is erythematous, macerated and slightly edematous with malodor present. The edema in her left foot is below her baseline, there is no erythema streaking. We will treat her with Santyl, doxycycline; we have ordered and xray, culture and provided a Peg assist surgical shoe and cultured the wound. 01/25/18-She is here in follow-up evaluation for a left great toe ulcer and presents with an abscess to her suprapubic area. She states her blood sugars remain elevated, feeling "sick" and if levels are below 250, but she is trying. She has made no attempt to decrease her smoking stating that we "can't take away her food in her cigarettes". She has been compliant with offloading using the PEG assist you. She is using Santyl daily. the culture obtained last week grew staph aureus and Enterococcus faecalis; continues on the doxycycline and Augmentin was added on Monday. The suprapubic area has erythema, no femoral variation, purple discoloration, minimal induration, was accessed with a cotton tip applicator with sanguinopurulent drainage, this was cultured, I suspect the current antibiotic treatment will cover and we will not add anything to her current treatment plan. She was advised to go to urgent  care or ER with any change in redness, induration or fever. 02/01/18-She is here in follow-up evaluation for left great toe ulcers and a new abdominal abscess from last week. She was able to use packing until earlier this week, where she "forgot it was there". She states she was feeling ill with GI symptoms last week and was not taking her antibiotic. She states her glucose levels have been predominantly less than 200, with occasional levels between 200-250. She thinks this was contributing to her GI symptoms as they have resolved without intervention. There continues to be significant laceration to left toe, otherwise it clinically looks stable/improved. There is now less superficial opening to the lateral aspect of the great toe that was residual blister. We will transition to [facility] to all wounds, she will continue her Augmentin. If there is no change or deterioration next week for reculture. 02/08/18-She is here in follow-up evaluation for left great toe ulcer and abdominal ulcer. There is an improvement in both wounds. She has been wrapping her left toe with coban, not by our direction, which has created an area of discoloration to the medial aspect; she has been advised to NOT use coban secondary to her neuropathy. She states her glucose levels have been high over this last week ranging from 200-350, she continues to smoke. She admits to being less compliant with her offloading shoe. We will continue with same treatment plan and she will follow-up next week. 02/15/18-She is here in follow-up evaluation for left great toe ulcer and abdominal ulcer. The abdominal ulcer is epithelialized. The left great toe ulcer is improved and all injury from last week using the Coban wrap is resolved, the lateral ulcer is healed. She admits to noncompliance with wearing offloading shoe and admits to glucose levels being greater than 300 most of the week. She continues to smoke and expresses no desire to  quit. There is one area medially that probes deeper than it has historically, erythema to the toe and dorsal foot has consistently waxed and waned. There is no overt signs of cellulitis or infection but we will culture the wound for any occult infection given the new area of depth and erythema. We will hold off on sensitivities for initiation of antibiotic therapy. 02/22/18-She is here in follow up evaluation for left great toe ulcer. There is overall significant improvement in both wound appearance, erythema and edema with changes made last week. She was not initiated on antibiotic therapy. Culture obtained last week showed oxacillin sensitive staph aureus, sensitive to clindamycin. Clindamycin has been called into the pharmacy but she has been instructed to hold off on initiation secondary to overall clinical improvement and her history of antibiotic intolerance. She has been instructed to contact the clinic with any noted changes/deterioration and the wound, erythema, Schonberg, Zandra A. ([id number]) edema and/or pain. She will follow-up next week. She continues to smoke and her glucose levels remain elevated >250; she admits to compliance with offloading shoe 03/01/18 on evaluation today patient appears to be doing fairly well in regard to her left first toe ulcer. She has been tolerating the dressing changes with the [facility] Dressing without complication and overall this has definitely showed signs of improvement according to records as well is what the patient tells me today. I'm very pleased in that regard. She is having no pain today 03/08/18 She is here for follow up evaluation of a left great toe ulcer. She remains non-compliant with glucose control and smoking cessation; glucose levels  consistently >200. She states that she got new shoe inserts/peg assist. She admits to compliance with offloading. Since my last evaluation there is significant improvement. We will switch to prisma at this  time and she will follow up next week. She is noted to be tachycardic at this appointment, heart rate 120s; she has a history of heart rate 70-130 according to our records. She admits to extreme agitation r/t personal issues; she was advised to monitor her heartrate and contact her physician if it does not return to a more normal range (<100). She takes cardizem twice daily. 03/15/18-She is here in follow-up evaluation for left great toe ulcer. She remains noncompliant with glucose control and smoking cessation. She admits to compliance with wearing offloading shoe. The ulcer is improved/stable and we will continue with the same treatment plan and she will follow-up next week 03/22/18-She is here for evaluation for left great toe ulcer. There continues to be significant improvement despite recurrent hyperglycemia (over 500 yesterday) and she continues to smoke. She has been compliant with offloading and we will continue with same treatment plan and she will follow-up next week. 03/29/18-She is here for evaluation for left great toe ulcer. Despite continuing to smoke and uncontrolled diabetes she continues to improve. She is compliant with offloading shoe. We will continue with the same treatment plan and she will follow-up next week 04/05/18- She is here in follow up evaluation for a left great toe ulcer; she presents with small pustule to left fifth toe (resembles ant bite). She admits to compliance with wearing offloading shoe; continues to smoke or have uncontrolled blood glucose control. There is more callus than usual with evidence of bleeding; she denies known trauma. 04/12/18-She is here for evaluation of left great toe ulcer. Despite noncompliance with glycemic control and smoking she continues to make improvement. She continues to wear offloading shoe. The pustule, that was identified last week, to the left fifth toe is resolved. She will follow-up in 2 weeks 05/03/18-she is seen in follow-up  evaluation for a left great toe ulcer. She is compliant with offloading, otherwise noncompliant with glycemic control and smoking. She has plateaued and there is minimal improvement noted. We will transition to [facility], replaced the insert to her surgical shoe and she will follow-up in one week 05/10/18- She is here in follow up evaluation for a left great toe ulcer. It appears stable despite measurement change. We will continue with same treatment plan and follow up next week. 05/24/18-She is seen in follow-up evaluation for a left great toe ulcer. She remains compliant with offloading, has made significant improvement in her diet, decreasing the amount of sugar/soda. She said her recent A1c was 10.9 which is lower than. She did see a diabetic nutritionist/educator yesterday. She continues to smoke. We will continue with the same treatment plan and she'll follow-up next week. 05/31/18- She is seen in follow-up evaluation for left great toe ulcer. She continues to remain compliant with offloading, continues to make improvement in her diet, increasing her water and decreasing the amount of sugar/soda. She does continue to smoke with no desire to quit. We will apply Prisma to the depth and Hydrofera Blue over. We have not received insurance authorization for oasis. She will follow up next week. 06/07/18-She is seen in follow-up evaluation for left great toe ulcer. It has stalled according to today's measurements although base appears stable. She says she saw a diabetic educator yesterday; her average blood sugars are less than 300 which is  an improvement for her. She continues to smoke and states "that's my next step" She continues with water over soda. We will order for xray, culture and reinstate ace wrap compression prior to placing apligraf for next week. She is voicing no complaints or concerns. Her dressing will change to iodoflex over the next week in preparation for apligraf. 06/14/18-She is  seen in follow-up evaluation for left great toe ulcer. Plain film x-ray performed last week was negative for osteomyelitis. Wound culture obtained last week grew strep B and OSSA; she is initiated on keflex and cefdinir today; there is erythema to the toe which could be from ace wrap compression, she has a history of wrapping too tight and has has been encouraged to maintain ace wraps that we place today. We will hold off on application of apligraf today, will apply next week after antibiotic therapy has been initiated. She admits today that she has resumed taking a shower with her foot/toe submerged in water, she has been reminded to keep foot/toe out of the bath water. She will be seen in follow up next week 06/21/18-she is seen in follow-up evaluation for left great toe ulcer. She is tolerating antibiotic therapy with no GI disturbance. The wound is stable. Apligraf was applied today. She has been decreasing her smoking, only had 4 cigarettes yesterday and 1 today. She continues being more compliant in diabetic diet. She will follow-up next week for evaluation of site, if stable will remove at 2 weeks. 06/28/18- She is here in follow up evalution. Apligraf was placed last week, she states the dressing fell off on Tuesday and she was dressing with hydrofera blue. She is healed and will be discharged from the clinic today. She has been instructed to continue with smoking cessation, continue monitoring glucose levels, offloading for an additional 4 weeks and continue with Metoyer, Kezia A. ([id number]) hydrofera blue for additional two weeks for any possible microscopic opening. Readmission: 08/07/18 on evaluation today patient presents for reevaluation concerning the ulcer of her right great toe. She was previously discharged on 06/28/18 healed. Nonetheless she states that this began to show signs of drainage she subsequently went to her primary care provider. Subsequently an x-ray was performed on  08/01/18 which was negative. The patient was also placed on antibiotics at that time. Fortunately they should have been effective for the infection. Nonetheless she's been experiencing some improvement but still has a lot of drainage coming from the wound itself. 08/14/18 on evaluation today patient's wound actually does show signs of improvement in regard to the erythema at this point. She has completed the antibiotics. With that being said we did discuss the possibility of placing her in a total contact cast as of today although I think that I may want to give this just a little bit more time to ensure nothing recurrence as far as her infection is concerned. I do not want to put in the cast and risk infection at that time if things are not completely resolved. With that being said she is gonna require some debridement today. 08/21/18 on evaluation today patient actually appears to be doing okay in regard to her toe ulcer. She's been tolerating the dressing changes without complication. With that being said it does appear that she is ready and in fact I think it's appropriate for [place] to go ahead and initiate the total contact cast today. Nonetheless she will require some sharp debridement to prepare the wound for application. Overall I feel like things have  been progressing well but we do need to do something to get this to close more readily. 08/24/18 patient seen today for reevaluation after having had the total contact cast applied on Tuesday. She seems to have done very well the wound appears to be doing great and overall I'm pleased with the progress that she's made. There were no abnormal areas of rubbing from the cast on her lower extremity. 08/30/18 on evaluation today patient actually appears to be completely healed in regard to her plantar toe ulcer. She tells me at this point she's been having a lot of issues with the cast. She almost fell a couple of times the state shall the step of her  dog a couple times as well. This is been a very frustrating process for her other nonetheless she has completely healed the wound which is excellent news. Overall there does not appear to be the evidence of infection at this time which is great news. 09/11/18 evaluation today patient presents for follow-up concerning her great toe ulcer on the left which has unfortunately reopened since I last saw her which was only a couple of weeks ago. Unfortunately she was not able to get in to get the shoe and potentially the AFO that's gonna be necessary due to her left foot drop. She continues with offloading shoe but this is not enough to prevent her from reopening it appears. When we last had her in the total contact cast she did well from a healing standpoint but unfortunately the wound reopened as soon as she came out of the cast within just a couple of weeks. 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 This is also part of the reason why she falls on a regular basis. I just do not believe that is gonna be safe for [place] to reinitiate the total contact cast as last time we had this on she fell 3 times one week which is definitely not normal for her. 09/18/18 upon evaluation today the patient actually appears to be doing about the same in regard to her toe ulcer. She did not contact [employer] as I asked her to even though I had given her the prescription. In fact she actually states that she has no idea where the prescription is. She did apparently call [employer] and they told her that all she needed to do was bring the prescription in order to be able to be seen and work on getting the AFO for her left foot. With all that being said she still does not have an appointment and I'm not sure were things stand that regard.  I will give her a new prescription today in order to contact them to get this set up. 09/25/18 on evaluation today patient actually appears to be doing about the same in regard to her toes ulcer. She does have a small areas which seems to have a lot of callous buildup around the edge of the wound which is going to need sharp debridement today. She still is waiting to be scheduled for evaluation with [employer] for possibility of an AFO. She states there supposed to call her tomorrow to get this set up. Unfortunately it does appear that her foot specifically the toe area is showing signs of erythema. There does not  appear to be any systemic infection which is in these good news. 10/02/18 on evaluation today patient actually appears to be doing about the same in regard to her toe ulcer. This really has not done too well although it's not significantly larger it's also not significantly smaller. She has been tolerating the dressing changes without complication. She actually has her appointment with [facility] and [facility] tomorrow to hopefully be measured for obtaining and AFO splint. I think this would be helpful preventing this from reoccurring. We had contemplated starting the cast this week although to be honest I am reluctant to do that as she's been having nausea, vomiting, and seizure activity over the past three days. She has a history of seizures and have been told is nothing that can be done for these. With [name], [name] ([id number]) that being said I do believe that along with the seizures have the nausea vomiting which upon further questioning doesn't seem to be the normal for her and makes me concerned for the possibility of infection or something else going on. I discussed this with the patient and her mother during the office visit today. I do not feel the wound is effective but maybe something else. The responses this was "this just happens to her at times and we don't know why". They did  not seem to be interested in going to the hospital to have this checked out further. 10/09/18 on evaluation today patient presents for follow-up concerning her ongoing toe ulcer. She has been tolerating the dressing changes without complication. Fortunately there does not appear to be any evidence of infection which is great news however I do think that the patient would benefit from going ahead for with the total contact cast. She's actually in a wheelchair today she tells me that she will use her walker if we initiate the cast. I was very specific about the fact that if we were gonna do the cast I wanted to make sure that she was using the walker in order to prevent any falls. She tells me she does not have stairs that she has to traverse on a regular basis at her home. She has not had any seizures since last week again that something that happens to her often she tells me she did talk to [employer] and they said that it may take up to three weeks to get the brace approved for her. Hopefully that will not take that long but nonetheless in the meantime I do think the cast could be of benefit. 10/12/18 on evaluation today patient appears to be doing rather well in regard to her toe ulcer. It's just been a few days and already this is significantly improved both as far as overall appearance and size. Fortunately there's no sign of infection. She is here for her first obligatory cast change. 10/19/18 Seen today for follow up and management of left great toe ulcer. Wound continues to show improvement. Noted small open area with seroussang drainage with palpation. Denies any increased pain or recent fevers during visit. She will continue calcium alginate with offloading shoe. Denies any questions or concerns during visit. Electronic Signature(s) Signed: 10/21/2018 12:42:22 AM By: Beather Arbour FNP-C Entered By: Beather Arbour on 10/19/2018 09:34:51 [name]  ([id number]) -------------------------------------------------------------------------------- Physical Exam Details Patient Name: Heather Dear A. Date of Service: 10/19/2018 8:00 AM Medical Record Number: [id number] Patient Account Number: [id number] Date of Birth/Sex: 1976/02/16 (42 y.o. F) Treating RN: Cornell Barman Primary Care Provider: Priscille Kluver Other Clinician:  Referring Provider: Priscille Kluver Treating Provider/Extender: Beather Arbour Weeks in Treatment: 10 Constitutional . appears in no distress. Eyes Conjunctivae clear. No discharge. Respiratory Respiratory effort is easy and symmetric bilaterally. Rate is normal at rest and on room air.. Cardiovascular Pedal pulses palpable and strong bilaterally.. Integumentary (Hair, Skin) Left foot wound. Notes Patient's wound bed has a fair amount of granulation with epithelialization around the edge of the wound. There is a small 0.2 depth open area with drainage on palpation.The wound is still healing well despite drainage. Encourage to continue to offload left foot whenever possible Electronic Signature(s) Signed: 10/21/2018 12:42:22 AM By: Beather Arbour FNP-C Entered By: Beather Arbour on 10/19/2018 09:41:20 [name] ([id number]) -------------------------------------------------------------------------------- Physician Orders Details Patient Name: Heather Dear A. Date of Service: 10/19/2018 8:00 AM Medical Record Number: [id number] Patient Account Number: [id number] Date of Birth/Sex: 22-Aug-1976 (42 y.o. F) Treating RN: Cornell Barman Primary Care Provider: Priscille Kluver Other Clinician: Referring Provider: Priscille Kluver Treating Provider/Extender: Oneida Arenas in Treatment: 10 Verbal / Phone Orders: No Diagnosis Coding Wound Cleansing Wound #4R Left Toe Great o Clean wound with Normal Saline. Primary Wound Dressing Wound #4R Left Toe Great o Silver Alginate Secondary Dressing Wound #4R Left Toe  Great o Gauze and Kerlix/Conform - Foam Dressing Change Frequency Wound #4R Left Toe Great o Change Dressing Monday, Wednesday, Friday Follow-up Appointments Wound #4R Left Toe Great o Return Appointment in 1 week. Edema Control Wound #4R Left Toe Great o Elevate legs to the level of the heart and pump ankles as often as possible Off-Loading Wound #4R Left Toe Great o Open toe surgical shoe to: Electronic Signature(s) Signed: 10/21/2018 12:42:22 AM By: Beather Arbour FNP-C Entered By: Beather Arbour on 10/19/2018 09:42:46 [name], [name] ([id number]) -------------------------------------------------------------------------------- Problem List Details Patient Name: Heather Dear A. Date of Service: 10/19/2018 8:00 AM Medical Record Number: [id number] Patient Account Number: [id number] Date of Birth/Sex: 09-Dec-1975 (42 y.o. 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 Date of Service: 10/19/2018 8:00 AM Medical Record Number: [id number] Patient Account Number: [id number] Date of Birth/Sex:  01-Mar-1976 (41 y.o. F) Treating RN: Cornell Barman Primary Care Provider: Priscille Kluver Other Clinician: Referring Provider: Priscille Kluver Treating Provider/Extender: Oneida Arenas in Treatment: 10 Subjective Chief Complaint Information obtained from Patient Left great toe ulcer History of Present Illness (HPI) 01/18/18-She is here for initial evaluation of the left great toe ulcer. She is a poor historian in regards to timeframe in detail. She states approximately 4 weeks ago she lacerated her toe on something in the house. She followed up with her primary care who placed her on Bactrim and ultimately a second dose of Bactrim prior to coming to wound clinic. She states she has been treating the toe with peroxide, Betadine and a Band-Aid. She did not check her blood sugar this morning but checked it yesterday  morning it was 327; she is unaware of a recent A1c and there are no current records. She saw Dr. she would've orthopedics last week for an old injury to the left ankle, she states he did not see her toe, nor did she bring it to his attention. She smokes approximately 1 pack cigarettes a day. Her social situation is concerning, she arrives this morning with her mother who appears extremely intoxicated/under the influence; her mother was asked to leave the room and be monitored by the patient's grandmother. The patient's aunt then accompanied the patient and the room throughout the rest of the appointment. We had a lengthy discussion regarding the deleterious effects of uncontrolled hyperglycemia and smoking as it relates to wound healing and overall health. She was strongly encouraged to decrease her smoking and get her diabetes under better control. She states she is currently on a diet and has cut down her [facility] consumption. The left toe is erythematous, macerated and slightly edematous with malodor present. The edema in her left foot is below her baseline, there is no erythema  streaking. We will treat her with Santyl, doxycycline; we have ordered and xray, culture and provided a Peg assist surgical shoe and cultured the wound. 01/25/18-She is here in follow-up evaluation for a left great toe ulcer and presents with an abscess to her suprapubic area. She states her blood sugars remain elevated, feeling "sick" and if levels are below 250, but she is trying. She has made no attempt to decrease her smoking stating that we "can't take away her food in her cigarettes". She has been compliant with offloading using the PEG assist you. She is using Santyl daily. the culture obtained last week grew staph aureus and Enterococcus faecalis; continues on the doxycycline and Augmentin was added on Monday. The suprapubic area has erythema, no femoral variation, purple discoloration, minimal induration, was accessed with a cotton tip applicator with sanguinopurulent drainage, this was cultured, I suspect the current antibiotic treatment will cover and we will not add anything to her current treatment plan. She was advised to go to urgent care or ER with any change in redness, induration or fever. 02/01/18-She is here in follow-up evaluation for left great toe ulcers and a new abdominal abscess from last week. She was able to use packing until earlier this week, where she "forgot it was there". She states she was feeling ill with GI symptoms last week and was not taking her antibiotic. She states her glucose levels have been predominantly less than 200, with occasional levels between 200-250. She thinks this was contributing to her GI symptoms as they have resolved without intervention. There continues to be significant laceration to left toe, otherwise it clinically looks stable/improved. There is now less superficial opening to the lateral aspect of the great toe that was residual blister. We will transition to [facility] to all wounds, she will continue her Augmentin. If there is no  change or deterioration next week for reculture. 02/08/18-She is here in follow-up evaluation for left great toe ulcer and abdominal ulcer. There is an improvement in both wounds. She has been wrapping her left toe with coban, not by our direction, which has created an area of discoloration to the medial aspect; she has been advised to NOT use coban secondary to her neuropathy. She states her glucose levels have been high over this last week ranging from 200-350, she continues to smoke. She admits to being less compliant with her offloading shoe. We will  continue with same treatment plan and she will follow-up next week. 02/15/18-She is here in follow-up evaluation for left great toe ulcer and abdominal ulcer. The abdominal ulcer is epithelialized. The left great toe ulcer is improved and all injury from last week using the Coban wrap is resolved, the lateral ulcer is healed. She admits to noncompliance with wearing offloading shoe and admits to glucose levels being greater than 300 most of the week. She continues to smoke and expresses no desire to quit. There is one area medially that probes deeper than it has historically, erythema to the toe and dorsal foot has consistently waxed and waned. There is no overt signs of cellulitis or infection but we will culture the wound for any occult infection given the new area of depth and erythema. We will hold off on Larranaga, Aizza A. ([id number]) sensitivities for initiation of antibiotic therapy. 02/22/18-She is here in follow up evaluation for left great toe ulcer. There is overall significant improvement in both wound appearance, erythema and edema with changes made last week. She was not initiated on antibiotic therapy. Culture obtained last week showed oxacillin sensitive staph aureus, sensitive to clindamycin. Clindamycin has been called into the pharmacy but she has been instructed to hold off on initiation secondary to overall clinical improvement and  her history of antibiotic intolerance. She has been instructed to contact the clinic with any noted changes/deterioration and the wound, erythema, edema and/or pain. She will follow-up next week. She continues to smoke and her glucose levels remain elevated >250; she admits to compliance with offloading shoe 03/01/18 on evaluation today patient appears to be doing fairly well in regard to her left first toe ulcer. She has been tolerating the dressing changes with the [facility] Dressing without complication and overall this has definitely showed signs of improvement according to records as well is what the patient tells me today. I'm very pleased in that regard. She is having no pain today 03/08/18 She is here for follow up evaluation of a left great toe ulcer. She remains non-compliant with glucose control and smoking cessation; glucose levels consistently >200. She states that she got new shoe inserts/peg assist. She admits to compliance with offloading. Since my last evaluation there is significant improvement. We will switch to prisma at this time and she will follow up next week. She is noted to be tachycardic at this appointment, heart rate 120s; she has a history of heart rate 70-130 according to our records. She admits to extreme agitation r/t personal issues; she was advised to monitor her heartrate and contact her physician if it does not return to a more normal range (<100). She takes cardizem twice daily. 03/15/18-She is here in follow-up evaluation for left great toe ulcer. She remains noncompliant with glucose control and smoking cessation. She admits to compliance with wearing offloading shoe. The ulcer is improved/stable and we will continue with the same treatment plan and she will follow-up next week 03/22/18-She is here for evaluation for left great toe ulcer. There continues to be significant improvement despite recurrent hyperglycemia (over 500 yesterday) and she continues to  smoke. She has been compliant with offloading and we will continue with same treatment plan and she will follow-up next week. 03/29/18-She is here for evaluation for left great toe ulcer. Despite continuing to smoke and uncontrolled diabetes she continues to improve. She is compliant with offloading shoe. We will continue with the same treatment plan and she will follow-up next week 04/05/18- She is here  in follow up evaluation for a left great toe ulcer; she presents with small pustule to left fifth toe (resembles ant bite). She admits to compliance with wearing offloading shoe; continues to smoke or have uncontrolled blood glucose control. There is more callus than usual with evidence of bleeding; she denies known trauma. 04/12/18-She is here for evaluation of left great toe ulcer. Despite noncompliance with glycemic control and smoking she continues to make improvement. She continues to wear offloading shoe. The pustule, that was identified last week, to the left fifth toe is resolved. She will follow-up in 2 weeks 05/03/18-she is seen in follow-up evaluation for a left great toe ulcer. She is compliant with offloading, otherwise noncompliant with glycemic control and smoking. She has plateaued and there is minimal improvement noted. We will transition to [facility], replaced the insert to her surgical shoe and she will follow-up in one week 05/10/18- She is here in follow up evaluation for a left great toe ulcer. It appears stable despite measurement change. We will continue with same treatment plan and follow up next week. 05/24/18-She is seen in follow-up evaluation for a left great toe ulcer. She remains compliant with offloading, has made significant improvement in her diet, decreasing the amount of sugar/soda. She said her recent A1c was 10.9 which is lower than. She did see a diabetic nutritionist/educator yesterday. She continues to smoke. We will continue with the same treatment plan and  she'll follow-up next week. 05/31/18- She is seen in follow-up evaluation for left great toe ulcer. She continues to remain compliant with offloading, continues to make improvement in her diet, increasing her water and decreasing the amount of sugar/soda. She does continue to smoke with no desire to quit. We will apply Prisma to the depth and Hydrofera Blue over. We have not received insurance authorization for oasis. She will follow up next week. 06/07/18-She is seen in follow-up evaluation for left great toe ulcer. It has stalled according to today's measurements although base appears stable. She says she saw a diabetic educator yesterday; her average blood sugars are less than 300 which is an improvement for her. She continues to smoke and states "that's my next step" She continues with water over soda. We will order for xray, culture and reinstate ace wrap compression prior to placing apligraf for next week. She is voicing no complaints or concerns. Her dressing will change to iodoflex over the next week in preparation for apligraf. 06/14/18-She is seen in follow-up evaluation for left great toe ulcer. Plain film x-ray performed last week was negative for osteomyelitis. Wound culture obtained last week grew strep B and OSSA; she is initiated on keflex and cefdinir today; there is erythema to the toe which could be from ace wrap compression, she has a history of wrapping too tight and has has been encouraged to maintain ace wraps that we place today. We will hold off on application of apligraf today, will apply next week after antibiotic therapy has been initiated. She admits today that she has resumed taking a shower with her foot/toe submerged in water, she has been reminded to keep foot/toe out of the bath water. She will be seen in follow up next week 06/21/18-she is seen in follow-up evaluation for left great toe ulcer. She is tolerating antibiotic therapy with no GI disturbance. The wound is  stable. Apligraf was applied today. She has been decreasing her smoking, only had 4 cigarettes yesterday and [name], [name] ([id number]) 1 today. She continues being more  compliant in diabetic diet. She will follow-up next week for evaluation of site, if stable will remove at 2 weeks. 06/28/18- She is here in follow up evalution. Apligraf was placed last week, she states the dressing fell off on Tuesday and she was dressing with hydrofera blue. She is healed and will be discharged from the clinic today. She has been instructed to continue with smoking cessation, continue monitoring glucose levels, offloading for an additional 4 weeks and continue with hydrofera blue for additional two weeks for any possible microscopic opening. Readmission: 08/07/18 on evaluation today patient presents for reevaluation concerning the ulcer of her right great toe. She was previously discharged on 06/28/18 healed. Nonetheless she states that this began to show signs of drainage she subsequently went to her primary care provider. Subsequently an x-ray was performed on 08/01/18 which was negative. The patient was also placed on antibiotics at that time. Fortunately they should have been effective for the infection. Nonetheless she's been experiencing some improvement but still has a lot of drainage coming from the wound itself. 08/14/18 on evaluation today patient's wound actually does show signs of improvement in regard to the erythema at this point. She has completed the antibiotics. With that being said we did discuss the possibility of placing her in a total contact cast as of today although I think that I may want to give this just a little bit more time to ensure nothing recurrence as far as her infection is concerned. I do not want to put in the cast and risk infection at that time if things are not completely resolved. With that being said she is gonna require some debridement today. 08/21/18 on evaluation today  patient actually appears to be doing okay in regard to her toe ulcer. She's been tolerating the dressing changes without complication. With that being said it does appear that she is ready and in fact I think it's appropriate for [place] to go ahead and initiate the total contact cast today. Nonetheless she will require some sharp debridement to prepare the wound for application. Overall I feel like things have been progressing well but we do need to do something to get this to close more readily. 08/24/18 patient seen today for reevaluation after having had the total contact cast applied on Tuesday. She seems to have done very well the wound appears to be doing great and overall I'm pleased with the progress that she's made. There were no abnormal areas of rubbing from the cast on her lower extremity. 08/30/18 on evaluation today patient actually appears to be completely healed in regard to her plantar toe ulcer. She tells me at this point she's been having a lot of issues with the cast. She almost fell a couple of times the state shall the step of her dog a couple times as well. This is been a very frustrating process for her other nonetheless she has completely healed the wound which is excellent news. Overall there does not appear to be the evidence of infection at this time which is great news. 09/11/18 evaluation today patient presents for follow-up concerning her great toe ulcer on the left which has unfortunately reopened since I last saw her which was only a couple of weeks ago. Unfortunately she was not able to get in to get the shoe and potentially the AFO that's gonna be necessary due to her left foot drop. She continues with offloading shoe but this is not enough to prevent her from reopening it appears.  When we last had her in the total contact cast she did well from a healing standpoint but unfortunately the wound reopened as soon as she came out of the cast within just a couple of weeks.  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 This is also part of the reason why she falls on a regular basis. I just do not believe that is gonna be safe for [place] to reinitiate the total contact cast as last time we had this on she fell 3 times one week which is definitely not normal for her. 09/18/18 upon evaluation today the patient actually appears to be doing about the same in regard to her toe ulcer. She did not contact [employer] as I asked her to even though I had given her the prescription. In fact she actually states that she has no idea where the prescription is. She did apparently call [employer] and they told her that all she needed to do was bring the prescription in order to be able to be seen and work on getting the AFO for her left foot. With all that being said she still does not have an appointment and I'm not sure were things stand that regard. I will give her a new prescription today in order to contact them to get this set up. 09/25/18 on evaluation today patient actually appears to be doing about the same in regard to her toes ulcer. She does have a small areas which seems to have a lot of callous buildup around the edge of the wound which is going to need sharp debridement today. She still is waiting to be scheduled for evaluation with [employer] for possibility of an AFO. She states there supposed to call her tomorrow to get this set up. Unfortunately it does appear that her foot specifically the toe area is showing signs of erythema. There does not appear to be any systemic infection which is in these good news. [name], [name] ([id number]) 10/02/18 on evaluation today patient actually appears to be doing about the same in regard to her toe ulcer. This really has not done too well although  it's not significantly larger it's also not significantly smaller. She has been tolerating the dressing changes without complication. She actually has her appointment with [facility] and [place] tomorrow to hopefully be measured for obtaining and AFO splint. I think this would be helpful preventing this from reoccurring. We had contemplated starting the cast this week although to be honest I am reluctant to do that as she's been having nausea, vomiting, and seizure activity over the past three days. She has a history of seizures and have been told is nothing that can be done for these. With that being said I do believe that along with the seizures have the nausea vomiting which upon further questioning doesn't seem to be the normal for her and makes me concerned for the possibility of infection or something else going on. I discussed this with the patient and her mother during the office visit today. I do not feel the wound is effective but maybe something else. The responses this was "this just happens to her at times and we don't know why". They did not seem to be interested in going to the hospital to have this  checked out further. 10/09/18 on evaluation today patient presents for follow-up concerning her ongoing toe ulcer. She has been tolerating the dressing changes without complication. Fortunately there does not appear to be any evidence of infection which is great news however I do think that the patient would benefit from going ahead for with the total contact cast. She's actually in a wheelchair today she tells me that she will use her walker if we initiate the cast. I was very specific about the fact that if we were gonna do the cast I wanted to make sure that she was using the walker in order to prevent any falls. She tells me she does not have stairs that she has to traverse on a regular basis at her home. She has not had any seizures since last week again that something that happens to  her often she tells me she did talk to [employer] and they said that it may take up to three weeks to get the brace approved for her. Hopefully that will not take that long but nonetheless in the meantime I do think the cast could be of benefit. 10/12/18 on evaluation today patient appears to be doing rather well in regard to her toe ulcer. It's just been a few days and already this is significantly improved both as far as overall appearance and size. Fortunately there's no sign of infection. She is here for her first obligatory cast change. 10/19/18 Seen today for follow up and management of left great toe ulcer. Wound continues to show improvement. Noted small open area with seroussang drainage with palpation. Denies any increased pain or recent fevers during visit. She will continue calcium alginate with offloading shoe. Denies any questions or concerns during visit. Wound History Patient presents with 1 open wound. Laboratory tests have been performed in the last month. Patient reportedly has tested positive for an antibiotic resistant organism. Patient History Information obtained from Patient. Social History Current every day smoker. Review of Systems (ROS) Constitutional Symptoms (General Health) The patient has no complaints or symptoms. Eyes The patient has no complaints or symptoms. Respiratory The patient has no complaints or symptoms. Cardiovascular The patient has no complaints or symptoms. Integumentary (Skin) Complains or has symptoms of Wounds - right great toe. [name], [name] ([id number]) Objective Constitutional appears in no distress. Vitals Time Taken: 8:08 AM, Height: 69 in, Weight: 240 lbs, BMI: 35.4, Temperature: 98.2 F, Pulse: 96 bpm, Respiratory Rate: 16 breaths/min, Blood Pressure: 133/97 mmHg. Eyes Conjunctivae clear. No discharge. Respiratory Respiratory effort is easy and symmetric bilaterally. Rate is normal at rest and on room  air.. Cardiovascular Pedal pulses palpable and strong bilaterally.. General Notes: Patient's wound bed has a fair amount of granulation with epithelialization around the edge of the wound. There is a small 0.2 depth open area with drainage on palpation.The wound is still healing well despite drainage. Encourage to continue to offload left foot whenever possible Integumentary (Hair, Skin) Left foot wound. Wound #4R status is Open. Original cause of wound was Gradually Appeared. The wound is located on the Left Toe Great. The wound measures 0.1cm length x 0.1cm width x 0.1cm depth; 0.008cm^2 area and 0.001cm^3 volume. 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 Primary Wound Dressing: Wound #4R Left Toe Great: Silver Alginate Secondary Dressing: Wound #4R  Left Toe Great: Gauze and Kerlix/Conform - Foam Holtmeyer, [name] ([id number]) Dressing Change Frequency: Wound #4R Left Toe Great: Change Dressing Monday, Wednesday, Friday Follow-up Appointments: Wound #4R Left Toe Great: Return Appointment in 1 week. Edema Control: Wound #4R Left Toe Great: Elevate legs to the level of the heart and pump ankles as often as possible Off-Loading: Wound #4R Left Toe Great: Open toe surgical shoe to: Electronic Signature(s) Signed: 10/21/2018 12:42:22 AM By: Beather Arbour FNP-C Entered By: Beather Arbour on 10/19/2018 09:42:57 [name] ([id number]) -------------------------------------------------------------------------------- ROS/PFSH Details Patient Name: Heather Dear A. Date of Service: 10/19/2018 8:00 AM Medical Record Number: [id number] Patient Account Number: [id number] Date of Birth/Sex: [birth year] (42 y.o. F) Treating RN: Cornell Barman Primary Care Provider: Priscille Kluver Other Clinician: Referring Provider: Priscille Kluver Treating Provider/Extender: Oneida Arenas in Treatment: 10 Information Obtained From Patient Wound History Do you currently have one or more open woundso Yes How many open wounds do you currently haveo 1 Integumentary (Skin) Complaints and Symptoms: Positive for: Wounds - left great toe Constitutional Symptoms (General Health) Complaints and Symptoms: No Complaints or Symptoms Eyes Complaints and Symptoms: No Complaints or Symptoms Medical History: Positive for: Cataracts; Glaucoma; Optic Neuritis Ear/Nose/Mouth/Throat Medical History: Positive for: Chronic sinus problems/congestion; Middle ear problems Respiratory Complaints and Symptoms: No Complaints or Symptoms Cardiovascular Complaints and Symptoms: No Complaints or Symptoms Endocrine Medical History: Positive for: Type II Diabetes Time with diabetes: 10 years Treated with: Insulin, Oral agents Blood sugar tested every day: No Blood sugar testing results: Lunch: 350 [name], [name] ([id number]) HBO Extended History Items Ear/Nose/Mouth/Throat: Eyes: Eyes: Ear/Nose/Mouth/Throat: Chronic sinus Cataracts Glaucoma Middle ear problems problems/congestion Immunizations Pneumococcal Vaccine: Received Pneumococcal Vaccination: No Implantable Devices Family and Social History Current every day smoker Physician Affirmation I have reviewed and agree with the above information. Electronic Signature(s) Signed: 10/19/2018 4:53:08 PM By: Gretta Cool, BSN, RN, CWS, [name] RN, BSN Signed: 10/21/2018 12:42:22 AM By: Beather Arbour FNP-C Entered By: Beather Arbour on 10/19/2018 09:43:59 [name] ([id number]) -------------------------------------------------------------------------------- SuperBill Details Patient Name: Heather Dear A. Date of Service: 10/19/2018 Medical Record Number: [id number] Patient Account Number: [id number] Date of Birth/Sex:  23-Jul-1976 (42 y.o. 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

## 2018-10-21 NOTE — Progress Notes (Signed)
KELISE, KUCH ([id number]) Visit Report for 10/19/2018 Arrival Information Details Patient Name: [name], [name] Date of Service: 10/19/2018 8:00 AM Medical Record Number: [id number] Patient Account Number: [id number] Date of Birth/Sex: 1976/09/18 (42 y.o. F) Treating RN: [name] Primary Care [name]: [name] Other Clinician: Referring [name]: [name] Treating [name]/Extender: [name] in Treatment: 10 Visit Information History Since Last Visit Added or deleted any medications: No Patient Arrived: Wheel Chair Any new allergies or adverse reactions: No Arrival Time: 08:07 Had a fall or experienced change in No activities of daily living that may affect Accompanied By: self risk of falls: Transfer Assistance: None Signs or symptoms of abuse/neglect since last visito No Patient Identification Verified: Yes Hospitalized since last visit: No Secondary Verification Process Completed: Yes Implantable device outside of the clinic excluding No Patient Requires Transmission-Based No cellular tissue based products placed in the center Precautions: since last visit: Patient Has Alerts: No Has Dressing in Place as Prescribed: Yes Has Footwear/Offloading in Place as Prescribed: Yes Left: Total Contact Cast Pain Present Now: No Electronic Signature(s) Signed: 10/19/2018 4:53:08 PM By: [name], BSN, RN, CWS, [name] RN, BSN Entered By: [name], BSN, RN, CWS, [name] on 10/19/2018 08:08:01 [name] ([id number]) -------------------------------------------------------------------------------- Clinic Level of Care Assessment Details Patient Name: Heather Dear A. Date of Service: 10/19/2018 8:00 AM Medical Record Number: [id number] Patient Account Number: [id number] Date of Birth/Sex: 01-19-1976 (42 y.o. F) Treating RN: [name] Primary Care [name]: [name] Other Clinician: Referring [name]: [name] Treating [name]/Extender: [name] in Treatment: 10 Clinic Level of Care Assessment Items TOOL 4 Quantity Score []  - Use when only an EandM is performed on FOLLOW-UP visit 0 ASSESSMENTS - Nursing Assessment / Reassessment []  - Reassessment of Co-morbidities (includes updates in patient status) 0 X- 1 5 Reassessment of Adherence to Treatment Plan ASSESSMENTS - Wound and Skin Assessment / Reassessment []  - Simple Wound Assessment / Reassessment - one wound 0 []  - 0 Complex Wound Assessment / Reassessment - multiple wounds []  - 0 Dermatologic / Skin Assessment (not related to wound area) ASSESSMENTS - Focused Assessment []  - Circumferential Edema Measurements - multi extremities 0 []  - 0 Nutritional Assessment / Counseling / Intervention []  - 0 Lower Extremity Assessment (monofilament, tuning fork, pulses) []  - 0 Peripheral Arterial Disease Assessment (using hand held doppler) ASSESSMENTS - Ostomy and/or Continence Assessment and Care []  - Incontinence Assessment and Management 0 []  - 0 Ostomy Care Assessment and Management (repouching, etc.) PROCESS - Coordination of Care X - Simple Patient / Family Education for ongoing care 1 15 []  - 0 Complex (extensive) Patient / Family Education for ongoing care []  - 0 Staff obtains Programmer, systems, Records, Test Results / Process Orders []  - 0 Staff telephones HHA, Nursing Homes / Clarify orders / etc []  - 0 Routine Transfer to another Facility (non-emergent condition) []  - 0 Routine Hospital Admission (non-emergent condition) []  - 0 New Admissions / Biomedical engineer / Ordering NPWT, Apligraf, etc. []  - 0 Emergency Hospital Admission (emergent condition) X- 1 10 Simple Discharge Coordination [name], [name] ([id number]) []  - 0 Complex (extensive) Discharge Coordination PROCESS - Special Needs []  - Pediatric / Minor Patient Management 0 []  - 0 Isolation Patient Management []  - 0 Hearing / Language / Visual special needs []  - 0 Assessment of  Community assistance (transportation, D/C planning, etc.) []  - 0 Additional assistance / Altered mentation []  - 0 Support Surface(s) Assessment (bed, cushion, seat, etc.) INTERVENTIONS - Wound Cleansing /  Measurement X - Simple Wound Cleansing - one wound 1 5 []  - 0 Complex Wound Cleansing - multiple wounds X- 1 5 Wound Imaging (photographs - any number of wounds) []  - 0 Wound Tracing (instead of photographs) X- 1 5 Simple Wound Measurement - one wound []  - 0 Complex Wound Measurement - multiple wounds INTERVENTIONS - Wound Dressings []  - Small Wound Dressing one or multiple wounds 0 X- 1 15 Medium Wound Dressing one or multiple wounds []  - 0 Large Wound Dressing one or multiple wounds []  - 0 Application of Medications - topical []  - 0 Application of Medications - injection INTERVENTIONS - Miscellaneous []  - External ear exam 0 []  - 0 Specimen Collection (cultures, biopsies, blood, body fluids, etc.) []  - 0 Specimen(s) / Culture(s) sent or taken to Lab for analysis []  - 0 Patient Transfer (multiple staff / Civil Service fast streamer / Similar devices) []  - 0 Simple Staple / Suture removal (25 or less) []  - 0 Complex Staple / Suture removal (26 or more) []  - 0 Hypo / Hyperglycemic Management (close monitor of Blood Glucose) []  - 0 Ankle / Brachial Index (ABI) - do not check if billed separately X- 1 5 Vital Signs [name], [name] ([id number]) Has the patient been seen at the hospital within the last three years: Yes Total Score: 65 Level Of Care: New/Established - Level 2 Electronic Signature(s) Signed: 10/19/2018 4:53:08 PM By: [name], BSN, RN, CWS, [name] RN, BSN Entered By: [name], BSN, RN, CWS, [name] on 10/19/2018 08:33:14 [name] ([id number]) -------------------------------------------------------------------------------- Encounter Discharge Information Details Patient Name: Heather Dear A. Date of Service: 10/19/2018 8:00 AM Medical Record Number: [id number] Patient  Account Number: [id number] Date of Birth/Sex: 07/24/1976 (42 y.o. F) Treating RN: [name] Primary Care [name]: [name] Other Clinician: Referring Shanyce Daris: [name] Treating Ishaan Villamar/Extender: [name] in Treatment: 10 Encounter Discharge Information Items Discharge Condition: Stable Ambulatory Status: Wheelchair Discharge Destination: Home Transportation: Private Auto Accompanied By: mom Schedule Follow-up Appointment: Yes Clinical Summary of Care: Electronic Signature(s) Signed: 10/19/2018 4:53:08 PM By: [name], BSN, RN, CWS, [name] RN, BSN Entered By: [name], BSN, RN, CWS, [name] on 10/19/2018 08:36:58 [name] ([id number]) -------------------------------------------------------------------------------- Lower Extremity Assessment Details Patient Name: Heather Dear A. Date of Service: 10/19/2018 8:00 AM Medical Record Number: [id number] Patient Account Number: [id number] Date of Birth/Sex: [birth year] (42 y.o. F) Treating RN: [name] Primary Care Wakeelah Solan: [name] Other Clinician: Referring Masai Kidd: [name] Treating Veleta Yamamoto/Extender: Beather Arbour Weeks in Treatment: 10 Edema Assessment Assessed: [Left: No] [Right: No] Edema: [Left: N] [Right: o] Vascular Assessment Pulses: Dorsalis Pedis Palpable: [Left:Yes] Posterior Tibial Extremity colors, hair growth, and conditions: Extremity Color: [Left:Normal] Hair Growth on Extremity: [Left:Yes] Temperature of Extremity: [Left:Warm] Capillary Refill: [Left:< 3 seconds] Toe Nail Assessment Left: Right: Thick: No Discolored: No Deformed: No Improper Length and Hygiene: No Electronic Signature(s) Signed: 10/19/2018 4:53:08 PM By: [name], BSN, RN, CWS, [name] RN, BSN Entered By: [name], BSN, RN, CWS, [name] on 10/19/2018 08:16:53 [name] ([id number]) -------------------------------------------------------------------------------- Multi Wound Chart Details Patient Name: Heather Dear A. Date of Service: 10/19/2018 8:00 AM Medical Record Number: [id number] Patient Account Number: [id number] Date of Birth/Sex: 08-10-1976 (42 y.o. F) Treating RN: [name] Primary Care Marriana Hibberd: [name] Other Clinician: Referring Tamie Minteer: [name] Treating Jeevan Kalla/Extender: [name] in Treatment: 10 Vital Signs Height(in): 69 Pulse(bpm): 96 Weight(lbs): 240 Blood Pressure(mmHg): 133/97 Body Mass Index(BMI): 35 Temperature(F): 98.2 Respiratory Rate 16 (breaths/min): Photos: [N/A:N/A] Wound Location: Left Toe Great N/A N/A  Wounding Event: Gradually Appeared N/A N/A Primary Etiology: Diabetic Wound/Ulcer of the N/A N/A Lower Extremity Date Acquired: 07/24/2018 N/A N/A Weeks of Treatment: 10 N/A N/A Wound Status: Open N/A N/A Wound Recurrence: Yes N/A N/A Pending Amputation on Yes N/A N/A Presentation: Measurements L x W x D 0.1x0.1x0.1 N/A N/A (cm) Area (cm) : 0.008 N/A N/A Volume (cm) : 0.001 N/A N/A % Reduction in Area: 99.50% N/A N/A % Reduction in Volume: 99.40% N/A N/A Classification: Grade 2 N/A N/A Periwound Skin Texture: No Abnormalities Noted N/A N/A Periwound Skin Moisture: No Abnormalities Noted N/A N/A Periwound Skin Color: No Abnormalities Noted N/A N/A Tenderness on Palpation: No N/A N/A Treatment Notes Wound #4R (Left Toe Great) Notes silvercel, foam,surgical shoe [name], [name] ([id number]) Electronic Signature(s) Signed: 10/21/2018 12:42:22 AM By: Beather Arbour FNP-C Entered By: Beather Arbour on 10/19/2018 09:24:10 [name] ([id number]) -------------------------------------------------------------------------------- Multi-Disciplinary Care Plan Details Patient Name: Heather Dear A. Date of Service: 10/19/2018 8:00 AM Medical Record Number: [id number] Patient Account Number: [id number] Date of Birth/Sex: 11/29/1975 (42 y.o. F) Treating RN: [name] Primary Care Nini Cavan: [name] Other  Clinician: Referring Emmely Bittinger: [name] Treating Loralye Loberg/Extender: [name] in Treatment: 10 Active Inactive Abuse / Safety / Falls / Self Care Management Nursing Diagnoses: History of Falls Goals: Patient will not experience any injury related to falls Date Initiated: 08/07/2018 Target Resolution Date: 10/19/2018 Goal Status: Active Interventions: Assess fall risk on admission and as needed Notes: Orientation to the Wound Care Program Nursing Diagnoses: Knowledge deficit related to the wound healing center program Goals: Patient/caregiver will verbalize understanding of the [facility] Date Initiated: 08/07/2018 Target Resolution Date: 10/19/2018 Goal Status: Active Interventions: Provide education on orientation to the wound center Notes: Soft Tissue Infection Nursing Diagnoses: Impaired tissue integrity Goals: Patient will remain free of wound infection Date Initiated: 08/07/2018 Target Resolution Date: 10/19/2018 Goal Status: Active Interventions: Assess signs and symptoms of infection every visit [name], [name] ([id number]) Notes: Wound/Skin Impairment Nursing Diagnoses: Impaired tissue integrity Goals: Ulcer/skin breakdown will heal within 14 weeks Date Initiated: 08/07/2018 Target Resolution Date: 10/19/2018 Goal Status: Active Interventions: Assess patient/caregiver ability to obtain necessary supplies Notes: Electronic Signature(s) Signed: 10/19/2018 4:53:08 PM By: [name], BSN, RN, CWS, [name] RN, BSN Entered By: [name], BSN, RN, CWS, [name] on 10/19/2018 08:20:58 [name] ([id number]) -------------------------------------------------------------------------------- Pain Assessment Details Patient Name: Heather Dear A. Date of Service: 10/19/2018 8:00 AM Medical Record Number: [id number] Patient Account Number: [id number] Date of Birth/Sex: June 14, 1976 (42 y.o. F) Treating RN: [name] Primary Care Nolon Yellin:  [name] Other Clinician: Referring Capria Cartaya: [name] Treating Tray Klayman/Extender: [name] in Treatment: 10 Active Problems Location of Pain Severity and Description of Pain Patient Has Paino No Site Locations With Dressing Change: No Pain Management and Medication Current Pain Management: Electronic Signature(s) Signed: 10/19/2018 4:53:08 PM By: [name], BSN, RN, CWS, [name] RN, BSN Entered By: [name], BSN, RN, CWS, [name] on 10/19/2018 08:08:15 [name] ([id number]) -------------------------------------------------------------------------------- Patient/Caregiver Education Details Patient Name: Heather Dear A. Date of Service: 10/19/2018 8:00 AM Medical Record Number: [id number] Patient Account Number: [id number] Date of Birth/Gender: October 13, 1976 (42 y.o. F) Treating RN: [name] Primary Care Physician: [name] Other Clinician: Referring Physician: Priscille [name] Treating Physician/Extender: [name] in Treatment: 10 Education Assessment Education Provided To: Patient Education Topics Provided Wound/Skin Impairment: Handouts: Caring for Your Ulcer Methods: Demonstration, Explain/Verbal Responses: State content correctly Electronic Signature(s) Signed: 10/19/2018 4:53:08 PM By: [name], BSN, RN, CWS, [name] RN,  BSN Entered By: [name], BSN, RN, CWS, [name] on 10/19/2018 08:33:29 [name] ([id number]) -------------------------------------------------------------------------------- Wound Assessment Details Patient Name: Heather Dear A. Date of Service: 10/19/2018 8:00 AM Medical Record Number: [id number] Patient Account Number: [id number] Date of Birth/Sex: 1976-05-23 (42 y.o. F) Treating RN: [name] Primary Care Allicia Culley: [name] Other Clinician: Referring Gracyn Allor: [name] Treating Tomasz Steeves/Extender: Beather Arbour Weeks in Treatment: 10 Wound Status Wound Number: 4R Primary Diabetic Wound/Ulcer of the  Lower Etiology: Extremity Wound Location: Left Toe Great Wound Status: Open Wounding Event: Gradually Appeared Date Acquired: 07/24/2018 Weeks Of Treatment: 10 Clustered Wound: No Pending Amputation On Presentation Photos Photo Uploaded By: [name], BSN, RN, CWS, [name] on 10/19/2018 08:42:58 Wound Measurements Length: (cm) 0.1 Width: (cm) 0.1 Depth: (cm) 0.1 Area: (cm) 0.008 Volume: (cm) 0.001 % Reduction in Area: 99.5% % Reduction in Volume: 99.4% Wound Description Classification: Grade 2 Periwound Skin Texture Texture Color No Abnormalities Noted: No No Abnormalities Noted: No Moisture No Abnormalities Noted: No Treatment Notes Wound #4R (Left Toe Great) Notes silvercel, foam,surgical shoe Electronic Signature(s) [name], [name] ([id number]) Signed: 10/19/2018 4:53:08 PM By: [name], BSN, RN, CWS, [name] RN, BSN Entered By: [name], BSN, RN, CWS, [name] on 10/19/2018 08:16:12 [name] ([id number]) -------------------------------------------------------------------------------- Vitals Details Patient Name: Heather Dear A. Date of Service: 10/19/2018 8:00 AM Medical Record Number: [id number] Patient Account Number: [id number] Date of Birth/Sex: 1976-08-25 (42 y.o. F) Treating RN: [name] Primary Care Richad Ramsay: [name] Other Clinician: Referring Jesua Tamblyn: [name] Treating Anysa Tacey/Extender: [name] in Treatment: 10 Vital Signs Time Taken: 08:08 Temperature (F): 98.2 Height (in): 69 Pulse (bpm): 96 Weight (lbs): 240 Respiratory Rate (breaths/min): 16 Body Mass Index (BMI): 35.4 Blood Pressure (mmHg): 133/97 Reference Range: 80 - 120 mg / dl Electronic Signature(s) Signed: 10/19/2018 4:53:08 PM By: [name], BSN, RN, CWS, [name] RN, BSN Entered By: [name], BSN, RN, CWS, [name] on 10/19/2018 89:16:94

## 2018-10-23 ENCOUNTER — Ambulatory Visit: Payer: Self-pay | Admitting: Family Medicine

## 2018-10-24 DIAGNOSIS — T401X1A Poisoning by heroin, accidental (unintentional), initial encounter: Secondary | ICD-10-CM

## 2018-10-24 HISTORY — DX: Poisoning by heroin, accidental (unintentional), initial encounter: T40.1X1A

## 2018-10-26 ENCOUNTER — Encounter: Payer: Medicaid Other | Attending: Physician Assistant | Admitting: Physician Assistant

## 2018-10-26 DIAGNOSIS — Z794 Long term (current) use of insulin: Secondary | ICD-10-CM | POA: Insufficient documentation

## 2018-10-26 DIAGNOSIS — F17210 Nicotine dependence, cigarettes, uncomplicated: Secondary | ICD-10-CM | POA: Insufficient documentation

## 2018-10-26 DIAGNOSIS — E11621 Type 2 diabetes mellitus with foot ulcer: Secondary | ICD-10-CM | POA: Insufficient documentation

## 2018-10-26 DIAGNOSIS — E1136 Type 2 diabetes mellitus with diabetic cataract: Secondary | ICD-10-CM | POA: Insufficient documentation

## 2018-10-26 DIAGNOSIS — L97522 Non-pressure chronic ulcer of other part of left foot with fat layer exposed: Secondary | ICD-10-CM | POA: Diagnosis not present

## 2018-10-30 ENCOUNTER — Encounter: Payer: Medicaid Other | Admitting: Physician Assistant

## 2018-10-30 DIAGNOSIS — E11621 Type 2 diabetes mellitus with foot ulcer: Secondary | ICD-10-CM | POA: Diagnosis not present

## 2018-11-01 NOTE — Progress Notes (Signed)
MERRIDITH, [name] ([id number]) Visit Report for 10/30/2018 Chief Complaint Document Details Patient Name: [name], [name] Date of Service: 10/30/2018 11:00 AM Medical Record Number: [id number] Patient Account Number: [id number] Date of Birth/Sex: 1976/04/14 (43 y.o. F) Treating RN: Montey Hora Primary Care Provider: Priscille Kluver Other Clinician: Referring Provider: Priscille Kluver Treating Provider/Extender: Melburn Hake, Ezrah Panning Weeks in Treatment: 12 Information Obtained from: Patient Chief Complaint Left great toe ulcer Electronic Signature(s) Signed: 10/31/2018 8:35:34 AM By: Worthy Keeler PA-C Entered By: Worthy Keeler on 10/30/2018 11:06:34 [name] ([id number]) -------------------------------------------------------------------------------- HPI Details Patient Name: Heather Dear A. Date of Service: 10/30/2018 11:00 AM Medical Record Number: [id number] Patient Account Number: [id number] Date of Birth/Sex: 11/29/1975 (43 y.o. F) Treating RN: Montey Hora Primary Care Provider: Priscille Kluver Other Clinician: Referring Provider: Priscille Kluver Treating Provider/Extender: Melburn Hake, Varian Innes Weeks in Treatment: 12 History of Present Illness HPI Description: 01/18/18-She is here for initial evaluation of the left great toe ulcer. She is a poor historian in regards to timeframe in detail. She states approximately 4 weeks ago she lacerated her toe on something in the house. She followed up with her primary care who placed her on Bactrim and ultimately a second dose of Bactrim prior to coming to wound clinic. She states she has been treating the toe with peroxide, Betadine and a Band-Aid. She did not check her blood sugar this morning but checked it yesterday morning it was 327; she is unaware of a recent A1c and there are no current records. She saw Dr. she would've orthopedics last week for an old injury to the left ankle, she states he did not see her toe, nor did she bring it to his  attention. She smokes approximately 1 pack cigarettes a day. Her social situation is concerning, she arrives this morning with her mother who appears extremely intoxicated/under the influence; her mother was asked to leave the room and be monitored by the patient's grandmother. The patient's aunt then accompanied the patient and the room throughout the rest of the appointment. We had a lengthy discussion regarding the deleterious effects of uncontrolled hyperglycemia and smoking as it relates to wound healing and overall health. She was strongly encouraged to decrease her smoking and get her diabetes under better control. She states she is currently on a diet and has cut down her [facility] consumption. The left toe is erythematous, macerated and slightly edematous with malodor present. The edema in her left foot is below her baseline, there is no erythema streaking. We will treat her with Santyl, doxycycline; we have ordered and xray, culture and provided a Peg assist surgical shoe and cultured the wound. 01/25/18-She is here in follow-up evaluation for a left great toe ulcer and presents with an abscess to her suprapubic area. She states her blood sugars remain elevated, feeling "sick" and if levels are below 250, but she is trying. She has made no attempt to decrease her smoking stating that we "can't take away her food in her cigarettes". She has been compliant with offloading using the PEG assist you. She is using Santyl daily. the culture obtained last week grew staph aureus and Enterococcus faecalis; continues on the doxycycline and Augmentin was added on Monday. The suprapubic area has erythema, no femoral variation, purple discoloration, minimal induration, was accessed with a cotton tip applicator with sanguinopurulent drainage, this was cultured, I suspect the current antibiotic treatment will cover and we will not add anything to her current treatment plan. She was advised  to go to urgent  care or ER with any change in redness, induration or fever. 02/01/18-She is here in follow-up evaluation for left great toe ulcers and a new abdominal abscess from last week. She was able to use packing until earlier this week, where she "forgot it was there". She states she was feeling ill with GI symptoms last week and was not taking her antibiotic. She states her glucose levels have been predominantly less than 200, with occasional levels between 200-250. She thinks this was contributing to her GI symptoms as they have resolved without intervention. There continues to be significant laceration to left toe, otherwise it clinically looks stable/improved. There is now less superficial opening to the lateral aspect of the great toe that was residual blister. We will transition to [facility] to all wounds, she will continue her Augmentin. If there is no change or deterioration next week for reculture. 02/08/18-She is here in follow-up evaluation for left great toe ulcer and abdominal ulcer. There is an improvement in both wounds. She has been wrapping her left toe with coban, not by our direction, which has created an area of discoloration to the medial aspect; she has been advised to NOT use coban secondary to her neuropathy. She states her glucose levels have been high over this last week ranging from 200-350, she continues to smoke. She admits to being less compliant with her offloading shoe. We will continue with same treatment plan and she will follow-up next week. 02/15/18-She is here in follow-up evaluation for left great toe ulcer and abdominal ulcer. The abdominal ulcer is epithelialized. The left great toe ulcer is improved and all injury from last week using the Coban wrap is resolved, the lateral ulcer is healed. She admits to noncompliance with wearing offloading shoe and admits to glucose levels being greater than 300 most of the week. She continues to smoke and expresses no desire to  quit. There is one area medially that probes deeper than it has historically, erythema to the toe and dorsal foot has consistently waxed and waned. There is no overt signs of cellulitis or infection but we will culture the wound for any occult infection given the new area of depth and erythema. We will hold off on sensitivities for initiation of antibiotic therapy. 02/22/18-She is here in follow up evaluation for left great toe ulcer. There is overall significant improvement in both wound appearance, erythema and edema with changes made last week. She was not initiated on antibiotic therapy. Culture obtained last week showed oxacillin sensitive staph aureus, sensitive to clindamycin. Clindamycin has been called into the pharmacy but she has been instructed to hold off on initiation secondary to overall clinical improvement and her history of antibiotic intolerance. She has been instructed to contact the clinic with any noted changes/deterioration and the wound, erythema, Franzel, Vernisha A. ([id number]) edema and/or pain. She will follow-up next week. She continues to smoke and her glucose levels remain elevated >250; she admits to compliance with offloading shoe 03/01/18 on evaluation today patient appears to be doing fairly well in regard to her left first toe ulcer. She has been tolerating the dressing changes with the [facility] Dressing without complication and overall this has definitely showed signs of improvement according to records as well is what the patient tells me today. I'm very pleased in that regard. She is having no pain today 03/08/18 She is here for follow up evaluation of a left great toe ulcer. She remains non-compliant with glucose control  and smoking cessation; glucose levels consistently >200. She states that she got new shoe inserts/peg assist. She admits to compliance with offloading. Since my last evaluation there is significant improvement. We will switch to prisma at this  time and she will follow up next week. She is noted to be tachycardic at this appointment, heart rate 120s; she has a history of heart rate 70-130 according to our records. She admits to extreme agitation r/t personal issues; she was advised to monitor her heartrate and contact her physician if it does not return to a more normal range (<100). She takes cardizem twice daily. 03/15/18-She is here in follow-up evaluation for left great toe ulcer. She remains noncompliant with glucose control and smoking cessation. She admits to compliance with wearing offloading shoe. The ulcer is improved/stable and we will continue with the same treatment plan and she will follow-up next week 03/22/18-She is here for evaluation for left great toe ulcer. There continues to be significant improvement despite recurrent hyperglycemia (over 500 yesterday) and she continues to smoke. She has been compliant with offloading and we will continue with same treatment plan and she will follow-up next week. 03/29/18-She is here for evaluation for left great toe ulcer. Despite continuing to smoke and uncontrolled diabetes she continues to improve. She is compliant with offloading shoe. We will continue with the same treatment plan and she will follow-up next week 04/05/18- She is here in follow up evaluation for a left great toe ulcer; she presents with small pustule to left fifth toe (resembles ant bite). She admits to compliance with wearing offloading shoe; continues to smoke or have uncontrolled blood glucose control. There is more callus than usual with evidence of bleeding; she denies known trauma. 04/12/18-She is here for evaluation of left great toe ulcer. Despite noncompliance with glycemic control and smoking she continues to make improvement. She continues to wear offloading shoe. The pustule, that was identified last week, to the left fifth toe is resolved. She will follow-up in 2 weeks 05/03/18-she is seen in follow-up  evaluation for a left great toe ulcer. She is compliant with offloading, otherwise noncompliant with glycemic control and smoking. She has plateaued and there is minimal improvement noted. We will transition to [facility], replaced the insert to her surgical shoe and she will follow-up in one week 05/10/18- She is here in follow up evaluation for a left great toe ulcer. It appears stable despite measurement change. We will continue with same treatment plan and follow up next week. 05/24/18-She is seen in follow-up evaluation for a left great toe ulcer. She remains compliant with offloading, has made significant improvement in her diet, decreasing the amount of sugar/soda. She said her recent A1c was 10.9 which is lower than. She did see a diabetic nutritionist/educator yesterday. She continues to smoke. We will continue with the same treatment plan and she'll follow-up next week. 05/31/18- She is seen in follow-up evaluation for left great toe ulcer. She continues to remain compliant with offloading, continues to make improvement in her diet, increasing her water and decreasing the amount of sugar/soda. She does continue to smoke with no desire to quit. We will apply Prisma to the depth and Hydrofera Blue over. We have not received insurance authorization for oasis. She will follow up next week. 06/07/18-She is seen in follow-up evaluation for left great toe ulcer. It has stalled according to today's measurements although base appears stable. She says she saw a diabetic educator yesterday; her average blood sugars are  less than 300 which is an improvement for her. She continues to smoke and states "that's my next step" She continues with water over soda. We will order for xray, culture and reinstate ace wrap compression prior to placing apligraf for next week. She is voicing no complaints or concerns. Her dressing will change to iodoflex over the next week in preparation for apligraf. 06/14/18-She is  seen in follow-up evaluation for left great toe ulcer. Plain film x-ray performed last week was negative for osteomyelitis. Wound culture obtained last week grew strep B and OSSA; she is initiated on keflex and cefdinir today; there is erythema to the toe which could be from ace wrap compression, she has a history of wrapping too tight and has has been encouraged to maintain ace wraps that we place today. We will hold off on application of apligraf today, will apply next week after antibiotic therapy has been initiated. She admits today that she has resumed taking a shower with her foot/toe submerged in water, she has been reminded to keep foot/toe out of the bath water. She will be seen in follow up next week 06/21/18-she is seen in follow-up evaluation for left great toe ulcer. She is tolerating antibiotic therapy with no GI disturbance. The wound is stable. Apligraf was applied today. She has been decreasing her smoking, only had 4 cigarettes yesterday and 1 today. She continues being more compliant in diabetic diet. She will follow-up next week for evaluation of site, if stable will remove at 2 weeks. 06/28/18- She is here in follow up evalution. Apligraf was placed last week, she states the dressing fell off on Tuesday and she was dressing with hydrofera blue. She is healed and will be discharged from the clinic today. She has been instructed to continue with smoking cessation, continue monitoring glucose levels, offloading for an additional 4 weeks and continue with Caraveo, Nastasha A. ([id number]) hydrofera blue for additional two weeks for any possible microscopic opening. Readmission: 08/07/18 on evaluation today patient presents for reevaluation concerning the ulcer of her right great toe. She was previously discharged on 06/28/18 healed. Nonetheless she states that this began to show signs of drainage she subsequently went to her primary care provider. Subsequently an x-ray was performed on  08/01/18 which was negative. The patient was also placed on antibiotics at that time. Fortunately they should have been effective for the infection. Nonetheless she's been experiencing some improvement but still has a lot of drainage coming from the wound itself. 08/14/18 on evaluation today patient's wound actually does show signs of improvement in regard to the erythema at this point. She has completed the antibiotics. With that being said we did discuss the possibility of placing her in a total contact cast as of today although I think that I may want to give this just a little bit more time to ensure nothing recurrence as far as her infection is concerned. I do not want to put in the cast and risk infection at that time if things are not completely resolved. With that being said she is gonna require some debridement today. 08/21/18 on evaluation today patient actually appears to be doing okay in regard to her toe ulcer. She's been tolerating the dressing changes without complication. With that being said it does appear that she is ready and in fact I think it's appropriate for [place] to go ahead and initiate the total contact cast today. Nonetheless she will require some sharp debridement to prepare the wound for application. Overall  I feel like things have been progressing well but we do need to do something to get this to close more readily. 08/24/18 patient seen today for reevaluation after having had the total contact cast applied on Tuesday. She seems to have done very well the wound appears to be doing great and overall I'm pleased with the progress that she's made. There were no abnormal areas of rubbing from the cast on her lower extremity. 08/30/18 on evaluation today patient actually appears to be completely healed in regard to her plantar toe ulcer. She tells me at this point she's been having a lot of issues with the cast. She almost fell a couple of times the state shall the step of her  dog a couple times as well. This is been a very frustrating process for her other nonetheless she has completely healed the wound which is excellent news. Overall there does not appear to be the evidence of infection at this time which is great news. 09/11/18 evaluation today patient presents for follow-up concerning her great toe ulcer on the left which has unfortunately reopened since I last saw her which was only a couple of weeks ago. Unfortunately she was not able to get in to get the shoe and potentially the AFO that's gonna be necessary due to her left foot drop. She continues with offloading shoe but this is not enough to prevent her from reopening it appears. When we last had her in the total contact cast she did well from a healing standpoint but unfortunately the wound reopened as soon as she came out of the cast within just a couple of weeks. 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 This is also part of the reason why she falls on a regular basis. I just do not believe that is gonna be safe for [place] to reinitiate the total contact cast as last time we had this on she fell 3 times one week which is definitely not normal for her. 09/18/18 upon evaluation today the patient actually appears to be doing about the same in regard to her toe ulcer. She did not contact [employer] as I asked her to even though I had given her the prescription. In fact she actually states that she has no idea where the prescription is. She did apparently call [employer] and they told her that all she needed to do was bring the prescription in order to be able to be seen and work on getting the AFO for her left foot. With all that being said she still does not have an appointment and I'm not sure were things stand that regard.  I will give her a new prescription today in order to contact them to get this set up. 09/25/18 on evaluation today patient actually appears to be doing about the same in regard to her toes ulcer. She does have a small areas which seems to have a lot of callous buildup around the edge of the wound which is going to need sharp debridement today. She still is waiting to be scheduled for evaluation with [employer] for possibility of an AFO. She states there supposed to call her tomorrow to get this set up. Unfortunately it does appear that her foot specifically the toe area is showing signs  of erythema. There does not appear to be any systemic infection which is in these good news. 10/02/18 on evaluation today patient actually appears to be doing about the same in regard to her toe ulcer. This really has not done too well although it's not significantly larger it's also not significantly smaller. She has been tolerating the dressing changes without complication. She actually has her appointment with [employer] and [employer] tomorrow to hopefully be measured for obtaining and AFO splint. I think this would be helpful preventing this from reoccurring. We had contemplated starting the cast this week although to be honest I am reluctant to do that as she's been having nausea, vomiting, and seizure activity over the past three days. She has a history of seizures and have been told is nothing that can be done for these. With [name], [name] ([id number]) that being said I do believe that along with the seizures have the nausea vomiting which upon further questioning doesn't seem to be the normal for her and makes me concerned for the possibility of infection or something else going on. I discussed this with the patient and her mother during the office visit today. I do not feel the wound is effective but maybe something else. The responses this was "this just happens to her at times and we don't know why". They did  not seem to be interested in going to the hospital to have this checked out further. 10/09/18 on evaluation today patient presents for follow-up concerning her ongoing toe ulcer. She has been tolerating the dressing changes without complication. Fortunately there does not appear to be any evidence of infection which is great news however I do think that the patient would benefit from going ahead for with the total contact cast. She's actually in a wheelchair today she tells me that she will use her walker if we initiate the cast. I was very specific about the fact that if we were gonna do the cast I wanted to make sure that she was using the walker in order to prevent any falls. She tells me she does not have stairs that she has to traverse on a regular basis at her home. She has not had any seizures since last week again that something that happens to her often she tells me she did talk to [employer] and they said that it may take up to three weeks to get the brace approved for her. Hopefully that will not take that long but nonetheless in the meantime I do think the cast could be of benefit. 10/12/18 on evaluation today patient appears to be doing rather well in regard to her toe ulcer. It's just been a few days and already this is significantly improved both as far as overall appearance and size. Fortunately there's no sign of infection. She is here for her first obligatory cast change. 10/19/18 Seen today for follow up and management of left great toe ulcer. Wound continues to show improvement. Noted small open area with seroussang drainage with palpation. Denies any increased pain or recent fevers during visit. She will continue calcium alginate with offloading shoe. Denies any questions or concerns during visit. 10/26/18 on evaluation today patient appears to be doing about the same as when I last saw her in regard to her wound bed. Fortunately there does not appear to be any signs of infection.  Unfortunately she continues to have a breakdown in regard to the toe region any time that she is not in the cast.  It takes almost no time at all for this to happen. Nonetheless she still has not heard anything from the brace being made by [employer] as to when exactly this will be available to her. Fortunately there is no signs of infection at this time. 10/30/18 on evaluation today patient presents for application of the total contact cast as we just received him this morning. Fortunately we are gonna be able to apply this to her today which is great news. She continues to have no significant pain which is good news. Overall I do feel like things have been improving while she was the cast is when she doesn't have a cast that things get worse. She still has not really heard anything from [employer] regarding her brace. Electronic Signature(s) Signed: 10/31/2018 8:35:34 AM By: Worthy Keeler PA-C Entered By: Worthy Keeler on 10/30/2018 17:30:11 [name] ([id number]) -------------------------------------------------------------------------------- Physical Exam Details Patient Name: Heather Dear A. Date of Service: 10/30/2018 11:00 AM Medical Record Number: [id number] Patient Account Number: [id number] Date of Birth/Sex: 1976-04-21 (43 y.o. F) Treating RN: Montey Hora Primary Care Provider: Priscille Kluver Other Clinician: Referring Provider: Priscille Kluver Treating Provider/Extender: STONE III, Maci Eickholt Weeks in Treatment: 67 Constitutional Well-nourished and well-hydrated in no acute distress. Respiratory normal breathing without difficulty. Psychiatric this patient is able to make decisions and demonstrates good insight into disease process. Alert and Oriented x 3. pleasant and cooperative. Notes Patient's Wednesday did not require sharp debridement although we did go ahead and apply the total contact cast which she tolerated without complication. Fortunately this appears to be doing very  well which is excellent news. She was discharged after 15 minutes once the cast had hardened to the point that it was safer to walk in her boot. Electronic Signature(s) Signed: 10/31/2018 8:35:34 AM By: Worthy Keeler PA-C Entered By: Worthy Keeler on 10/30/2018 17:30:54 [name] ([id number]) -------------------------------------------------------------------------------- Physician Orders Details Patient Name: Heather Dear A. Date of Service: 10/30/2018 11:00 AM Medical Record Number: [id number] Patient Account Number: [id number] Date of Birth/Sex: Apr 23, 1976 (43 y.o. 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 Primary Wound Dressing Wound #4R Left Toe Great o Silver Collagen Secondary Dressing Wound #4R Left Toe Great o Foam Dressing Change Frequency Wound #4R Left Toe Great o Other: - Friday 10/30/2018 Follow-up Appointments Wound #4R Left Toe Great o Return Appointment in 1 week. Edema Control Wound #4R Left Toe Great o Elevate legs to the level of the heart and pump ankles as often as possible Off-Loading Wound #4R Left Toe Great o Total Contact Cast to Left Lower Extremity Electronic Signature(s) Signed: 10/30/2018 4:56:07 PM By: Montey Hora Signed: 10/31/2018 8:35:34 AM By: Worthy Keeler PA-C Entered By: Montey Hora on 10/30/2018 12:10:09 [name] ([id number]) [name], [name] ([id number]) -------------------------------------------------------------------------------- Problem List  Details Patient Name: Heather Dear A. Date of Service: 10/30/2018 11:00 AM Medical Record Number: [id number] Patient Account Number: [id number] Date of Birth/Sex: 1976/10/14 (43 y.o. 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 ([id number]) -------------------------------------------------------------------------------- Progress Note Details Patient Name: Heather Dear A. Date of Service: 10/30/2018 11:00 AM Medical Record Number: [id number] Patient Account Number: [id number] Date of Birth/Sex: [birth year] (43 y.o. F) Treating RN: Montey Hora Primary Care Provider: Priscille Kluver Other Clinician: Referring Provider: Priscille Kluver Treating Provider/Extender: Melburn Hake, Hulda Reddix Weeks in Treatment: 12 Subjective Chief Complaint Information obtained from Patient Left great toe ulcer History of Present Illness (HPI) 01/18/18-She is here for initial evaluation of the left great toe ulcer. She is a poor historian in regards to timeframe in detail. She states approximately 4 weeks ago she lacerated her toe on something in the house. She followed up with her primary care who placed her on Bactrim and ultimately a second dose of Bactrim prior to coming to wound clinic. She  states she has been treating the toe with peroxide, Betadine and a Band-Aid. She did not check her blood sugar this morning but checked it yesterday morning it was 327; she is unaware of a recent A1c and there are no current records. She saw Dr. she would've orthopedics last week for an old injury to the left ankle, she states he did not see her toe, nor did she bring it to his attention. She smokes approximately 1 pack cigarettes a day. Her social situation is concerning, she arrives this morning with her mother who appears extremely intoxicated/under the influence; her mother was asked to leave the room and be monitored by the patient's grandmother. The patient's aunt then accompanied the patient and the room throughout the rest of the appointment. We had a lengthy discussion regarding the deleterious effects of uncontrolled hyperglycemia and smoking as it relates to wound healing and overall health. She was strongly encouraged to decrease her smoking and get her diabetes under better control. She states she is currently on a diet and has cut down her [facility] consumption. The left toe is erythematous, macerated and slightly edematous with malodor present. The edema in her left foot is below her baseline, there is no erythema streaking. We will treat her with Santyl, doxycycline; we have ordered and xray, culture and provided a Peg assist surgical shoe and cultured the wound. 01/25/18-She is here in follow-up evaluation for a left great toe ulcer and presents with an abscess to her suprapubic area. She states her blood sugars remain elevated, feeling "sick" and if levels are below 250, but she is trying. She has made no attempt to decrease her smoking stating that we "can't take away her food in her cigarettes". She has been compliant with offloading using the PEG assist you. She is using Santyl daily. the culture obtained last week grew staph aureus and Enterococcus faecalis; continues on the  doxycycline and Augmentin was added on Monday. The suprapubic area has erythema, no femoral variation, purple discoloration, minimal induration, was accessed with a cotton tip applicator with sanguinopurulent drainage, this was cultured, I suspect the current antibiotic treatment will cover and we will not add anything to her  current treatment plan. She was advised to go to urgent care or ER with any change in redness, induration or fever. 02/01/18-She is here in follow-up evaluation for left great toe ulcers and a new abdominal abscess from last week. She was able to use packing until earlier this week, where she "forgot it was there". She states she was feeling ill with GI symptoms last week and was not taking her antibiotic. She states her glucose levels have been predominantly less than 200, with occasional levels between 200-250. She thinks this was contributing to her GI symptoms as they have resolved without intervention. There continues to be significant laceration to left toe, otherwise it clinically looks stable/improved. There is now less superficial opening to the lateral aspect of the great toe that was residual blister. We will transition to [facility] to all wounds, she will continue her Augmentin. If there is no change or deterioration next week for reculture. 02/08/18-She is here in follow-up evaluation for left great toe ulcer and abdominal ulcer. There is an improvement in both wounds. She has been wrapping her left toe with coban, not by our direction, which has created an area of discoloration to the medial aspect; she has been advised to NOT use coban secondary to her neuropathy. She states her glucose levels have been high over this last week ranging from 200-350, she continues to smoke. She admits to being less compliant with her offloading shoe. We will continue with same treatment plan and she will follow-up next week. 02/15/18-She is here in follow-up evaluation for left  great toe ulcer and abdominal ulcer. The abdominal ulcer is epithelialized. The left great toe ulcer is improved and all injury from last week using the Coban wrap is resolved, the lateral ulcer is healed. She admits to noncompliance with wearing offloading shoe and admits to glucose levels being greater than 300 most of the week. She continues to smoke and expresses no desire to quit. There is one area medially that probes deeper than it has historically, erythema to the toe and dorsal foot has consistently waxed and waned. There is no overt signs of cellulitis or infection but we will culture the wound for any occult infection given the new area of depth and erythema. We will hold off on Bjorklund, Jakeline A. ([id number]) sensitivities for initiation of antibiotic therapy. 02/22/18-She is here in follow up evaluation for left great toe ulcer. There is overall significant improvement in both wound appearance, erythema and edema with changes made last week. She was not initiated on antibiotic therapy. Culture obtained last week showed oxacillin sensitive staph aureus, sensitive to clindamycin. Clindamycin has been called into the pharmacy but she has been instructed to hold off on initiation secondary to overall clinical improvement and her history of antibiotic intolerance. She has been instructed to contact the clinic with any noted changes/deterioration and the wound, erythema, edema and/or pain. She will follow-up next week. She continues to smoke and her glucose levels remain elevated >250; she admits to compliance with offloading shoe 03/01/18 on evaluation today patient appears to be doing fairly well in regard to her left first toe ulcer. She has been tolerating the dressing changes with the [facility] Dressing without complication and overall this has definitely showed signs of improvement according to records as well is what the patient tells me today. I'm very pleased in that regard. She is  having no pain today 03/08/18 She is here for follow up evaluation of a left great toe ulcer.  She remains non-compliant with glucose control and smoking cessation; glucose levels consistently >200. She states that she got new shoe inserts/peg assist. She admits to compliance with offloading. Since my last evaluation there is significant improvement. We will switch to prisma at this time and she will follow up next week. She is noted to be tachycardic at this appointment, heart rate 120s; she has a history of heart rate 70-130 according to our records. She admits to extreme agitation r/t personal issues; she was advised to monitor her heartrate and contact her physician if it does not return to a more normal range (<100). She takes cardizem twice daily. 03/15/18-She is here in follow-up evaluation for left great toe ulcer. She remains noncompliant with glucose control and smoking cessation. She admits to compliance with wearing offloading shoe. The ulcer is improved/stable and we will continue with the same treatment plan and she will follow-up next week 03/22/18-She is here for evaluation for left great toe ulcer. There continues to be significant improvement despite recurrent hyperglycemia (over 500 yesterday) and she continues to smoke. She has been compliant with offloading and we will continue with same treatment plan and she will follow-up next week. 03/29/18-She is here for evaluation for left great toe ulcer. Despite continuing to smoke and uncontrolled diabetes she continues to improve. She is compliant with offloading shoe. We will continue with the same treatment plan and she will follow-up next week 04/05/18- She is here in follow up evaluation for a left great toe ulcer; she presents with small pustule to left fifth toe (resembles ant bite). She admits to compliance with wearing offloading shoe; continues to smoke or have uncontrolled blood glucose control. There is more callus than usual  with evidence of bleeding; she denies known trauma. 04/12/18-She is here for evaluation of left great toe ulcer. Despite noncompliance with glycemic control and smoking she continues to make improvement. She continues to wear offloading shoe. The pustule, that was identified last week, to the left fifth toe is resolved. She will follow-up in 2 weeks 05/03/18-she is seen in follow-up evaluation for a left great toe ulcer. She is compliant with offloading, otherwise noncompliant with glycemic control and smoking. She has plateaued and there is minimal improvement noted. We will transition to [facility], replaced the insert to her surgical shoe and she will follow-up in one week 05/10/18- She is here in follow up evaluation for a left great toe ulcer. It appears stable despite measurement change. We will continue with same treatment plan and follow up next week. 05/24/18-She is seen in follow-up evaluation for a left great toe ulcer. She remains compliant with offloading, has made significant improvement in her diet, decreasing the amount of sugar/soda. She said her recent A1c was 10.9 which is lower than. She did see a diabetic nutritionist/educator yesterday. She continues to smoke. We will continue with the same treatment plan and she'll follow-up next week. 05/31/18- She is seen in follow-up evaluation for left great toe ulcer. She continues to remain compliant with offloading, continues to make improvement in her diet, increasing her water and decreasing the amount of sugar/soda. She does continue to smoke with no desire to quit. We will apply Prisma to the depth and Hydrofera Blue over. We have not received insurance authorization for oasis. She will follow up next week. 06/07/18-She is seen in follow-up evaluation for left great toe ulcer. It has stalled according to today's measurements although base appears stable. She says she saw a diabetic educator yesterday;  her average blood sugars are less  than 300 which is an improvement for her. She continues to smoke and states "that's my next step" She continues with water over soda. We will order for xray, culture and reinstate ace wrap compression prior to placing apligraf for next week. She is voicing no complaints or concerns. Her dressing will change to iodoflex over the next week in preparation for apligraf. 06/14/18-She is seen in follow-up evaluation for left great toe ulcer. Plain film x-ray performed last week was negative for osteomyelitis. Wound culture obtained last week grew strep B and OSSA; she is initiated on keflex and cefdinir today; there is erythema to the toe which could be from ace wrap compression, she has a history of wrapping too tight and has has been encouraged to maintain ace wraps that we place today. We will hold off on application of apligraf today, will apply next week after antibiotic therapy has been initiated. She admits today that she has resumed taking a shower with her foot/toe submerged in water, she has been reminded to keep foot/toe out of the bath water. She will be seen in follow up next week 06/21/18-she is seen in follow-up evaluation for left great toe ulcer. She is tolerating antibiotic therapy with no GI disturbance. The wound is stable. Apligraf was applied today. She has been decreasing her smoking, only had 4 cigarettes yesterday and Stelmach, Saachi A. ([id number]) 1 today. She continues being more compliant in diabetic diet. She will follow-up next week for evaluation of site, if stable will remove at 2 weeks. 06/28/18- She is here in follow up evalution. Apligraf was placed last week, she states the dressing fell off on Tuesday and she was dressing with hydrofera blue. She is healed and will be discharged from the clinic today. She has been instructed to continue with smoking cessation, continue monitoring glucose levels, offloading for an additional 4 weeks and continue with hydrofera blue for  additional two weeks for any possible microscopic opening. Readmission: 08/07/18 on evaluation today patient presents for reevaluation concerning the ulcer of her right great toe. She was previously discharged on 06/28/18 healed. Nonetheless she states that this began to show signs of drainage she subsequently went to her primary care provider. Subsequently an x-ray was performed on 08/01/18 which was negative. The patient was also placed on antibiotics at that time. Fortunately they should have been effective for the infection. Nonetheless she's been experiencing some improvement but still has a lot of drainage coming from the wound itself. 08/14/18 on evaluation today patient's wound actually does show signs of improvement in regard to the erythema at this point. She has completed the antibiotics. With that being said we did discuss the possibility of placing her in a total contact cast as of today although I think that I may want to give this just a little bit more time to ensure nothing recurrence as far as her infection is concerned. I do not want to put in the cast and risk infection at that time if things are not completely resolved. With that being said she is gonna require some debridement today. 08/21/18 on evaluation today patient actually appears to be doing okay in regard to her toe ulcer. She's been tolerating the dressing changes without complication. With that being said it does appear that she is ready and in fact I think it's appropriate for [place] to go ahead and initiate the total contact cast today. Nonetheless she will require some sharp debridement to prepare  the wound for application. Overall I feel like things have been progressing well but we do need to do something to get this to close more readily. 08/24/18 patient seen today for reevaluation after having had the total contact cast applied on Tuesday. She seems to have done very well the wound appears to be doing great and  overall I'm pleased with the progress that she's made. There were no abnormal areas of rubbing from the cast on her lower extremity. 08/30/18 on evaluation today patient actually appears to be completely healed in regard to her plantar toe ulcer. She tells me at this point she's been having a lot of issues with the cast. She almost fell a couple of times the state shall the step of her dog a couple times as well. This is been a very frustrating process for her other nonetheless she has completely healed the wound which is excellent news. Overall there does not appear to be the evidence of infection at this time which is great news. 09/11/18 evaluation today patient presents for follow-up concerning her great toe ulcer on the left which has unfortunately reopened since I last saw her which was only a couple of weeks ago. Unfortunately she was not able to get in to get the shoe and potentially the AFO that's gonna be necessary due to her left foot drop. She continues with offloading shoe but this is not enough to prevent her from reopening it appears. When we last had her in the total contact cast she did well from a healing standpoint but unfortunately the wound reopened as soon as she came out of the cast within just a couple of weeks. 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 This is also part of the reason why she falls on a regular basis. I just do not believe that is gonna be safe for [place] to reinitiate the total contact cast as last time we had this on she fell 3 times one week which is definitely not normal for her. 09/18/18 upon evaluation today the patient actually appears to be doing about the same in regard to her toe ulcer. She did not contact [employer] as I asked her to even though I had given her  the prescription. In fact she actually states that she has no idea where the prescription is. She did apparently call [employer] and they told her that all she needed to do was bring the prescription in order to be able to be seen and work on getting the AFO for her left foot. With all that being said she still does not have an appointment and I'm not sure were things stand that regard. I will give her a new prescription today in order to contact them to get this set up. 09/25/18 on evaluation today patient actually appears to be doing about the same in regard to her toes ulcer. She does have a small areas which seems to have a lot of callous buildup around the edge of the wound which is going to need sharp debridement today. She still is waiting to be scheduled for evaluation with [employer] for possibility of an AFO. She states there supposed to call her tomorrow to get this set up. Unfortunately it does appear that her foot specifically the  toe area is showing signs of erythema. There does not appear to be any systemic infection which is in these good news. [name], [name] ([id number]) 10/02/18 on evaluation today patient actually appears to be doing about the same in regard to her toe ulcer. This really has not done too well although it's not significantly larger it's also not significantly smaller. She has been tolerating the dressing changes without complication. She actually has her appointment with [employer] and [place] tomorrow to hopefully be measured for obtaining and AFO splint. I think this would be helpful preventing this from reoccurring. We had contemplated starting the cast this week although to be honest I am reluctant to do that as she's been having nausea, vomiting, and seizure activity over the past three days. She has a history of seizures and have been told is nothing that can be done for these. With that being said I do believe that along with the seizures have the nausea vomiting  which upon further questioning doesn't seem to be the normal for her and makes me concerned for the possibility of infection or something else going on. I discussed this with the patient and her mother during the office visit today. I do not feel the wound is effective but maybe something else. The responses this was "this just happens to her at times and we don't know why". They did not seem to be interested in going to the hospital to have this checked out further. 10/09/18 on evaluation today patient presents for follow-up concerning her ongoing toe ulcer. She has been tolerating the dressing changes without complication. Fortunately there does not appear to be any evidence of infection which is great news however I do think that the patient would benefit from going ahead for with the total contact cast. She's actually in a wheelchair today she tells me that she will use her walker if we initiate the cast. I was very specific about the fact that if we were gonna do the cast I wanted to make sure that she was using the walker in order to prevent any falls. She tells me she does not have stairs that she has to traverse on a regular basis at her home. She has not had any seizures since last week again that something that happens to her often she tells me she did talk to [employer] and they said that it may take up to three weeks to get the brace approved for her. Hopefully that will not take that long but nonetheless in the meantime I do think the cast could be of benefit. 10/12/18 on evaluation today patient appears to be doing rather well in regard to her toe ulcer. It's just been a few days and already this is significantly improved both as far as overall appearance and size. Fortunately there's no sign of infection. She is here for her first obligatory cast change. 10/19/18 Seen today for follow up and management of left great toe ulcer. Wound continues to show improvement. Noted small open area  with seroussang drainage with palpation. Denies any increased pain or recent fevers during visit. She will continue calcium alginate with offloading shoe. Denies any questions or concerns during visit. 10/26/18 on evaluation today patient appears to be doing about the same as when I last saw her in regard to her wound bed. Fortunately there does not appear to be any signs of infection. Unfortunately she continues to have a breakdown in regard to the toe region any time that she  is not in the cast. It takes almost no time at all for this to happen. Nonetheless she still has not heard anything from the brace being made by [employer] as to when exactly this will be available to her. Fortunately there is no signs of infection at this time. 10/30/18 on evaluation today patient presents for application of the total contact cast as we just received him this morning. Fortunately we are gonna be able to apply this to her today which is great news. She continues to have no significant pain which is good news. Overall I do feel like things have been improving while she was the cast is when she doesn't have a cast that things get worse. She still has not really heard anything from [place] regarding her brace. Patient History Information obtained from Patient. Social History Current every day smoker. Review of Systems (ROS) Constitutional Symptoms (General Health) Denies complaints or symptoms of Fever, Chills. Respiratory The patient has no complaints or symptoms. Cardiovascular The patient has no complaints or symptoms. Psychiatric The patient has no complaints or symptoms. [name], [name] ([id number]) Objective Constitutional Well-nourished and well-hydrated in no acute distress. Vitals Time Taken: 11:11 AM, Height: 69 in, Weight: 240 lbs, BMI: 35.4, Temperature: 98.4 F, Pulse: 88 bpm, Respiratory Rate: 16 breaths/min, Blood Pressure: 117/69 mmHg. Respiratory normal breathing without  difficulty. Psychiatric this patient is able to make decisions and demonstrates good insight into disease process. Alert and Oriented x 3. pleasant and cooperative. General Notes: Patient's Wednesday did not require sharp debridement although we did go ahead and apply the total contact cast which she tolerated without complication. Fortunately this appears to be doing very well which is excellent news. She was discharged after 15 minutes once the cast had hardened to the point that it was safer to walk in her boot. Integumentary (Hair, Skin) Wound #4R status is Open. Original cause of wound was Gradually Appeared. The wound is located on the Left Toe Great. The wound measures 0.7cm length x 0.3cm width x 0.3cm depth; 0.165cm^2 area and 0.049cm^3 volume. There is Fat Layer (Subcutaneous Tissue) Exposed exposed. There is no tunneling noted, however, there is undermining starting at 12:00 and ending at 3:00 with a maximum distance of 0.4cm. There is a medium amount of serous drainage noted. The wound margin is flat and intact. There is large (67-100%) red granulation within the wound bed. There is a small (1-33%) amount of necrotic tissue within the wound bed including Adherent Slough. The periwound skin appearance exhibited: Scarring. The periwound skin appearance did not exhibit: Callus, Crepitus, Excoriation, Induration, Rash, Dry/Scaly, Maceration, Atrophie Blanche, Cyanosis, Ecchymosis, Hemosiderin Staining, Mottled, Pallor, Rubor, Erythema. Periwound temperature was noted as No Abnormality. Assessment Active Problems ICD-10 Type 2 diabetes mellitus with foot ulcer Non-pressure chronic ulcer of other part of left foot with fat layer exposed Foot drop, left foot Procedures Wound #4R Balbi, Alfredo A. ([id number]) Pre-procedure diagnosis of Wound #4R is a Diabetic Wound/Ulcer of the Lower Extremity located on the Left Toe Great . There was a Total Contact Cast Procedure by STONE III, Angline Schweigert  E., PA-C. Post procedure Diagnosis Wound #4R: Same as Pre-Procedure Plan Wound Cleansing: Wound #4R Left Toe Great: Clean wound with Normal Saline. Primary Wound Dressing: Wound #4R Left Toe Great: Silver Collagen Secondary Dressing: Wound #4R Left Toe Great: Foam Dressing Change Frequency: Wound #4R Left Toe Great: Other: - Friday 10/30/2018 Follow-up Appointments: Wound #4R Left Toe Great: Return Appointment in 1 week. Edema Control: Wound #4R Left  Toe Great: Elevate legs to the level of the heart and pump ankles as often as possible Off-Loading: Wound #4R Left Toe Great: Total Contact Cast to Left Lower Extremity I'm gonna suggest that we see her back in one week for reevaluation to see were things stand as far as the wound is concerned the patient is in agreement with plan. Please see above for specific wound care orders. We will see patient for re-evaluation in 1 week(s) here in the clinic. If anything worsens or changes patient will contact our office for additional recommendations. she is still supposed to be using her walker to get around. She is aware of this. Electronic Signature(s) Signed: 10/31/2018 8:35:34 AM By: Worthy Keeler PA-C Entered By: Worthy Keeler on 10/30/2018 17:31:17 [name] ([id number]) -------------------------------------------------------------------------------- ROS/PFSH Details Patient Name: Heather Dear A. Date of Service: 10/30/2018 11:00 AM Medical Record Number: [id number] Patient Account Number: [id number] Date of Birth/Sex: [birth year] (43 y.o. F) Treating RN: Montey Hora Primary Care Provider: Priscille Kluver Other Clinician: Referring Provider: Priscille Kluver Treating Provider/Extender: STONE III, Jackie Russman Weeks in Treatment: 12 Information Obtained From Patient Wound History Do you currently have one or more open woundso Yes How many open wounds do you currently haveo 1 Constitutional Symptoms (General Health) Complaints and  Symptoms: Negative for: Fever; Chills Eyes Medical History: Positive for: Cataracts; Glaucoma; Optic Neuritis Ear/Nose/Mouth/Throat Medical History: Positive for: Chronic sinus problems/congestion; Middle ear problems Respiratory Complaints and Symptoms: No Complaints or Symptoms Cardiovascular Complaints and Symptoms: No Complaints or Symptoms Endocrine Medical History: Positive for: Type II Diabetes Time with diabetes: 10 years Treated with: Insulin, Oral agents Blood sugar tested every day: No Blood sugar testing results: Lunch: 350 Psychiatric Complaints and Symptoms: No Complaints or Symptoms HBO Extended History Items [name], [name] ([id number]) Eyes: Eyes: Ear/Nose/Mouth/Throat: Ear/Nose/Mouth/Throat: Cataracts Glaucoma Chronic sinus Middle ear problems problems/congestion Immunizations Pneumococcal Vaccine: Received Pneumococcal Vaccination: No Implantable Devices Family and Social History Current every day smoker Physician Affirmation I have reviewed and agree with the above information. Electronic Signature(s) Signed: 10/31/2018 8:35:34 AM By: Worthy Keeler PA-C Signed: 10/31/2018 5:48:26 PM By: Montey Hora Entered By: Worthy Keeler on 10/30/2018 17:30:27 [name], [name] Kitchen ([id number]) -------------------------------------------------------------------------------- Total Contact Cast Details Patient Name: Heather Dear A. Date of Service: 10/30/2018 11:00 AM Medical Record Number: [id number] Patient Account Number: [id number] Date of Birth/Sex: 1976-05-10 (43 y.o. F) Treating RN: Montey Hora Primary Care Provider: Priscille Kluver Other Clinician: Referring Provider: Priscille Kluver Treating Provider/Extender: STONE III, Enza Shone Weeks in Treatment: 12 Total Contact Cast Applied for Wound Assessment: Wound #4R Left Toe Great Performed By: Physician Emilio Math., PA-C Post Procedure Diagnosis Same as Pre-procedure Electronic Signature(s) Signed:  10/30/2018 4:56:07 PM By: Montey Hora Signed: 10/31/2018 8:35:34 AM By: Worthy Keeler PA-C Entered By: Montey Hora on 10/30/2018 13:21:28 [name] ([id number]) -------------------------------------------------------------------------------- SuperBill Details Patient Name: Heather Dear A. Date of Service: 10/30/2018 Medical Record Number: [id number] Patient Account Number: [id number] Date of Birth/Sex: [birth year] (43 y.o. F) Treating RN: Montey Hora Primary Care Provider: Priscille Kluver Other Clinician: Referring Provider: Priscille Kluver Treating Provider/Extender: Melburn Hake, Gabrian Hoque Weeks in Treatment: 12 Diagnosis Coding ICD-10 Codes Code Description E11.621 Type 2 diabetes mellitus with foot ulcer L97.522 Non-pressure chronic ulcer of other part of left foot with fat layer exposed M21.372 Foot drop, left foot Facility Procedures CPT4 Code: 77824235 Description: [phone] - APPLY TOTAL CONTACT LEG CAST ICD-10 Diagnosis Description E11.621 Type 2 diabetes mellitus with foot ulcer L97.522 Non-pressure chronic  ulcer of other part of left foot with fat M21.372 Foot drop, left foot Modifier: layer exposed Quantity: 1 Physician Procedures CPT4 Code: 6270350 Description: 09381 - WC PHYS APPLY TOTAL CONTACT CAST ICD-10 Diagnosis Description E11.621 Type 2 diabetes mellitus with foot ulcer L97.522 Non-pressure chronic ulcer of other part of left foot with fat M21.372 Foot drop, left foot Modifier: layer exposed Quantity: 1 Electronic Signature(s) Signed: 10/31/2018 8:35:34 AM By: Worthy Keeler PA-C Previous Signature: 10/30/2018 4:56:07 PM Version By: Montey Hora Entered By: Worthy Keeler on 10/30/2018 17:31:25

## 2018-11-02 ENCOUNTER — Encounter: Payer: Medicaid Other | Admitting: Physician Assistant

## 2018-11-02 DIAGNOSIS — E11621 Type 2 diabetes mellitus with foot ulcer: Secondary | ICD-10-CM | POA: Diagnosis not present

## 2018-11-02 NOTE — Progress Notes (Signed)
KAYAH, HECKER ([id number]) Visit Report for 10/30/2018 Arrival Information Details Patient Name: [name], [name] Date of Service: 10/30/2018 11:00 AM Medical Record Number: [id number] Patient Account Number: [id number] Date of Birth/Sex: [birth year] (43 y.o. F) Treating RN: Montey Hora Primary Care Candace Ramus: Priscille Kluver Other Clinician: Referring Hurschel Paynter: Priscille Kluver Treating Janayia Burggraf/Extender: Melburn Hake, HOYT Weeks in Treatment: 12 Visit Information History Since Last Visit Added or deleted any medications: No Patient Arrived: Wheel Chair Any new allergies or adverse reactions: No Arrival Time: 11:10 Had a fall or experienced change in No activities of daily living that may affect Accompanied By: mother risk of falls: Transfer Assistance: None Signs or symptoms of abuse/neglect since last visito No Patient Identification Verified: Yes Hospitalized since last visit: No Secondary Verification Process Completed: Yes Implantable device outside of the clinic excluding No Patient Requires Transmission-Based No cellular tissue based products placed in the center Precautions: since last visit: Patient Has Alerts: No Has Dressing in Place as Prescribed: Yes Pain Present Now: No Electronic Signature(s) Signed: 10/30/2018 3:06:21 PM By: Lorine Bears RCP, RRT, CHT Entered By: Lorine Bears on 10/30/2018 11:10:51 [name] ([id number]) -------------------------------------------------------------------------------- Encounter Discharge Information Details Patient Name: Heather Dear A. Date of Service: 10/30/2018 11:00 AM Medical Record Number: [id number] Patient Account Number: [id number] Date of Birth/Sex: 08-19-1976 (43 y.o. F) Treating RN: Montey Hora Primary Care Parley Pidcock: Priscille Kluver Other Clinician: Referring Daking Westervelt: Priscille Kluver Treating Resa Rinks/Extender: Melburn Hake, HOYT Weeks in Treatment: 12 Encounter Discharge Information  Items Discharge Condition: Stable Ambulatory Status: Wheelchair Discharge Destination: Home Transportation: Private Auto Accompanied By: mother Schedule Follow-up Appointment: Yes Clinical Summary of Care: Electronic Signature(s) Signed: 10/30/2018 4:56:07 PM By: Montey Hora Entered By: Montey Hora on 10/30/2018 13:23:19 [name] ([id number]) -------------------------------------------------------------------------------- Lower Extremity Assessment Details Patient Name: Heather Dear A. Date of Service: 10/30/2018 11:00 AM Medical Record Number: [id number] Patient Account Number: [id number] Date of Birth/Sex: Jan 24, 1976 (43 y.o. F) Treating RN: Harold Barban Primary Care Lasonya Hubner: Priscille Kluver Other Clinician: Referring Lativia Velie: Priscille Kluver Treating Shamir Tuzzolino/Extender: STONE III, HOYT Weeks in Treatment: 12 Edema Assessment Assessed: [Left: No] [Right: No] [Left: Edema] [Right: :] Calf Left: Right: Point of Measurement: 32 cm From Medial Instep 31.8 cm cm Ankle Left: Right: Point of Measurement: 12 cm From Medial Instep 19 cm cm Vascular Assessment Pulses: Dorsalis Pedis Palpable: [Left:No] Posterior Tibial Palpable: [Left:No] Extremity colors, hair growth, and conditions: Extremity Color: [Left:Red] Hair Growth on Extremity: [Left:Yes] Temperature of Extremity: [Left:Warm] Capillary Refill: [Left:< 3 seconds] Toe Nail Assessment Left: Right: Thick: Yes Discolored: Yes Deformed: Yes Improper Length and Hygiene: No Electronic Signature(s) Signed: 11/01/2018 2:41:13 PM By: Harold Barban Entered By: Harold Barban on 10/30/2018 11:19:42 [name], [name] ([id number]) -------------------------------------------------------------------------------- Multi Wound Chart Details Patient Name: Heather Dear A. Date of Service: 10/30/2018 11:00 AM Medical Record Number: [id number] Patient Account Number: [id number] Date of Birth/Sex: 10-May-1976 (43 y.o.  F) Treating RN: Montey Hora Primary Care Cecilio Ohlrich: Priscille Kluver Other Clinician: Referring Mella Inclan: Priscille Kluver Treating Yanai Hobson/Extender: STONE III, HOYT Weeks in Treatment: 12 Vital Signs Height(in): 69 Pulse(bpm): 88 Weight(lbs): 240 Blood Pressure(mmHg): 117/69 Body Mass Index(BMI): 35 Temperature(F): 98.4 Respiratory Rate 16 (breaths/min): Photos: [4R:No Photos] [N/A:N/A] Wound Location: [4R:Left Toe Great] [N/A:N/A] Wounding Event: [4R:Gradually Appeared] [N/A:N/A] Primary Etiology: [4R:Diabetic Wound/Ulcer of the N/A Lower Extremity] Comorbid History: [4R:Cataracts, Glaucoma, Optic Neuritis, Chronic sinus problems/congestion, Middle ear problems, Type II Diabetes] [N/A:N/A] Date Acquired: [4R:07/24/2018] [N/A:N/A] Weeks of Treatment: [4R:12] [N/A:N/A] Wound Status: [4R:Open] [N/A:N/A] Wound Recurrence: [4R:Yes] [N/A:N/A] Pending  Amputation on [4R:Yes] [N/A:N/A] Presentation: Measurements L x W x D [4R:0.7x0.3x0.3] [N/A:N/A] (cm) Area (cm) : [4R:0.165] [N/A:N/A] Volume (cm) : [4R:0.049] [N/A:N/A] % Reduction in Area: [4R:89.40%] [N/A:N/A] % Reduction in Volume: [4R:68.60%] [N/A:N/A] Starting Position 1 [4R:12] (o'clock): Ending Position 1 [4R:3] (o'clock): Maximum Distance 1 (cm): [4R:0.4] Undermining: [4R:Yes] [N/A:N/A] Classification: [4R:Grade 2] [N/A:N/A] Exudate Amount: [4R:Medium] [N/A:N/A] Exudate Type: [4R:Serous] [N/A:N/A] Exudate Color: [4R:amber] [N/A:N/A] Wound Margin: [4R:Flat and Intact] [N/A:N/A] Granulation Amount: [4R:Large (67-100%)] [N/A:N/A] Granulation Quality: [4R:Red] [N/A:N/A] Necrotic Amount: [4R:Small (1-33%)] [N/A:N/A] Exposed Structures: [4R:Fat Layer (Subcutaneous Tissue) Exposed: Yes] [N/A:N/A] Fascia: No Tendon: No Muscle: No Joint: No Bone: No Epithelialization: None N/A N/A Periwound Skin Texture: Scarring: Yes N/A N/A Excoriation: No Induration: No Callus: No Crepitus: No Rash: No Periwound Skin  Moisture: Maceration: No N/A N/A Dry/Scaly: No Periwound Skin Color: Atrophie Blanche: No N/A N/A Cyanosis: No Ecchymosis: No Erythema: No Hemosiderin Staining: No Mottled: No Pallor: No Rubor: No Temperature: No Abnormality N/A N/A Tenderness on Palpation: No N/A N/A Wound Preparation: Ulcer Cleansing: N/A N/A Rinsed/Irrigated with Saline Topical Anesthetic Applied: Other: lidocaine 4% Treatment Notes Electronic Signature(s) Signed: 10/30/2018 4:56:07 PM By: Montey Hora Entered By: Montey Hora on 10/30/2018 13:21:17 [name] ([id number]) -------------------------------------------------------------------------------- Saline Details Patient Name: Heather Dear A. Date of Service: 10/30/2018 11:00 AM Medical Record Number: [id number] Patient Account Number: [id number] Date of Birth/Sex: 1976-03-18 (43 y.o. F) Treating RN: Montey Hora Primary Care Nirvi Boehler: Priscille Kluver Other Clinician: Referring Rodman Recupero: Priscille Kluver Treating Lisbeth Puller/Extender: Melburn Hake, HOYT Weeks in Treatment: 12 Active Inactive Abuse / Safety / Falls / Self Care Management Nursing Diagnoses: History of Falls Goals: Patient will not experience any injury related to falls Date Initiated: 08/07/2018 Target Resolution Date: 10/19/2018 Goal Status: Active Interventions: Assess fall risk on admission and as needed Notes: Orientation to the Wound Care Program Nursing Diagnoses: Knowledge deficit related to the wound healing center program Goals: Patient/caregiver will verbalize understanding of the [place] Date Initiated: 08/07/2018 Target Resolution Date: 10/19/2018 Goal Status: Active Interventions: Provide education on orientation to the wound center Notes: Soft Tissue Infection Nursing Diagnoses: Impaired tissue integrity Goals: Patient will remain free of wound infection Date Initiated: 08/07/2018 Target Resolution Date:  10/19/2018 Goal Status: Active Interventions: Assess signs and symptoms of infection every visit [name], [name] ([id number]) Notes: Wound/Skin Impairment Nursing Diagnoses: Impaired tissue integrity Goals: Ulcer/skin breakdown will heal within 14 weeks Date Initiated: 08/07/2018 Target Resolution Date: 10/19/2018 Goal Status: Active Interventions: Assess patient/caregiver ability to obtain necessary supplies Notes: Electronic Signature(s) Signed: 10/30/2018 4:56:07 PM By: Montey Hora Entered By: Montey Hora on 10/30/2018 13:21:09 [name] ([id number]) -------------------------------------------------------------------------------- Pain Assessment Details Patient Name: Heather Dear A. Date of Service: 10/30/2018 11:00 AM Medical Record Number: [id number] Patient Account Number: [id number] Date of Birth/Sex: 09-21-1976 (43 y.o. F) Treating RN: Montey Hora Primary Care Leala Bryand: Priscille Kluver Other Clinician: Referring Wilmore Holsomback: Priscille Kluver Treating Davien Malone/Extender: STONE III, HOYT Weeks in Treatment: 12 Active Problems Location of Pain Severity and Description of Pain Patient Has Paino No Site Locations Pain Management and Medication Current Pain Management: Electronic Signature(s) Signed: 10/30/2018 3:06:21 PM By: Paulla Fore, RRT, CHT Signed: 10/30/2018 4:56:07 PM By: Montey Hora Entered By: Lorine Bears on 10/30/2018 11:11:00 [name] ([id number]) -------------------------------------------------------------------------------- Patient/Caregiver Education Details Patient Name: Heather Dear A. Date of Service: 10/30/2018 11:00 AM Medical Record Number: [id number] Patient Account Number: [id number] Date of Birth/Gender: 1976-04-25 (43 y.o. F) Treating RN: Montey Hora Primary Care  Physician: Priscille Kluver Other Clinician: Referring Physician: Priscille Kluver Treating Physician/Extender: Sharalyn Ink  in Treatment: 12 Education Assessment Education Provided To: Patient and Caregiver Education Topics Provided Offloading: Handouts: Other: TCC precautions Methods: Explain/Verbal Responses: State content correctly Electronic Signature(s) Signed: 10/30/2018 4:56:07 PM By: Montey Hora Entered By: Montey Hora on 10/30/2018 13:22:04 [name] ([id number]) -------------------------------------------------------------------------------- Wound Assessment Details Patient Name: Heather Dear A. Date of Service: 10/30/2018 11:00 AM Medical Record Number: [id number] Patient Account Number: [id number] Date of Birth/Sex: 04-25-1976 (43 y.o. F) Treating RN: Harold Barban Primary Care Vance Belcourt: Priscille Kluver Other Clinician: Referring Zarra Geffert: Priscille Kluver Treating Camauri Fleece/Extender: STONE III, HOYT Weeks in Treatment: 12 Wound Status Wound Number: 4R Primary Diabetic Wound/Ulcer of the Lower Extremity Etiology: Wound Location: Left Toe Great Wound Open Wounding Event: Gradually Appeared Status: Date Acquired: 07/24/2018 Comorbid Cataracts, Glaucoma, Optic Neuritis, Chronic Weeks Of Treatment: 12 History: sinus problems/congestion, Middle ear problems, Clustered Wound: No Type II Diabetes Pending Amputation On Presentation Photos Photo Uploaded By: Harold Barban on 10/30/2018 16:15:19 Wound Measurements Length: (cm) 0.7 Width: (cm) 0.3 Depth: (cm) 0.3 Area: (cm) 0.165 Volume: (cm) 0.049 % Reduction in Area: 89.4% % Reduction in Volume: 68.6% Epithelialization: None Tunneling: No Undermining: Yes Starting Position (o'clock): 12 Ending Position (o'clock): 3 Maximum Distance: (cm) 0.4 Wound Description Classification: Grade 2 Wound Margin: Flat and Intact Exudate Amount: Medium Exudate Type: Serous Exudate Color: amber Foul Odor After Cleansing: No Slough/Fibrino Yes Wound Bed Granulation Amount: Large (67-100%) Exposed Structure Granulation Quality:  Red Fascia Exposed: No Necrotic Amount: Small (1-33%) Fat Layer (Subcutaneous Tissue) Exposed: Yes Necrotic Quality: Adherent Slough Tendon Exposed: No [name], [name] ([id number]) Muscle Exposed: No Joint Exposed: No Bone Exposed: No Periwound Skin Texture Texture Color No Abnormalities Noted: No No Abnormalities Noted: No Callus: No Atrophie Blanche: No Crepitus: No Cyanosis: No Excoriation: No Ecchymosis: No Induration: No Erythema: No Rash: No Hemosiderin Staining: No Scarring: Yes Mottled: No Pallor: No Moisture Rubor: No No Abnormalities Noted: No Dry / Scaly: No Temperature / Pain Maceration: No Temperature: No Abnormality Wound Preparation Ulcer Cleansing: Rinsed/Irrigated with Saline Topical Anesthetic Applied: Other: lidocaine 4%, Electronic Signature(s) Signed: 11/01/2018 2:41:13 PM By: Harold Barban Entered By: Harold Barban on 10/30/2018 11:17:27 [name] ([id number]) -------------------------------------------------------------------------------- Vitals Details Patient Name: Heather Dear A. Date of Service: 10/30/2018 11:00 AM Medical Record Number: [id number] Patient Account Number: [id number] Date of Birth/Sex: 07/17/1976 (43 y.o. F) Treating RN: Montey Hora Primary Care Lennix Kneisel: Priscille Kluver Other Clinician: Referring Armin Yerger: Priscille Kluver Treating Montell Leopard/Extender: STONE III, HOYT Weeks in Treatment: 12 Vital Signs Time Taken: 11:11 Temperature (F): 98.4 Height (in): 69 Pulse (bpm): 88 Weight (lbs): 240 Respiratory Rate (breaths/min): 16 Body Mass Index (BMI): 35.4 Blood Pressure (mmHg): 117/69 Reference Range: 80 - 120 mg / dl Electronic Signature(s) Signed: 10/30/2018 3:06:21 PM By: Lorine Bears RCP, RRT, CHT Entered By: Lorine Bears on 10/30/2018 11:14:30

## 2018-11-02 NOTE — Progress Notes (Signed)
LAEL, WETHERBEE ([id number]) Visit Report for 10/26/2018 Chief Complaint Document Details Patient Name: [name], [name] Date of Service: 10/26/2018 1:00 PM Medical Record Number: [id number] Patient Account Number: [id number] Date of Birth/Sex: 1975-11-30 (43 y.o. F) Treating RN: Montey Hora Primary Care Provider: Priscille Kluver Other Clinician: Referring Provider: Priscille Kluver Treating Provider/Extender: Melburn Hake, HOYT Weeks in Treatment: 11 Information Obtained from: Patient Chief Complaint Left great toe ulcer Electronic Signature(s) Signed: 11/01/2018 10:02:40 AM By: Worthy Keeler PA-C Entered By: Worthy Keeler on 10/26/2018 13:10:08 [name] ([id number]) -------------------------------------------------------------------------------- Debridement Details Patient Name: Heather Dear A. Date of Service: 10/26/2018 1:00 PM Medical Record Number: [id number] Patient Account Number: [id number] Date of Birth/Sex: February 12, 1976 (43 y.o. F) Treating RN: Harold Barban Primary Care Provider: Priscille Kluver Other Clinician: Referring Provider: Priscille Kluver Treating Provider/Extender: STONE III, HOYT Weeks in Treatment: 11 Debridement Performed for Wound #4R Left Toe Great Assessment: Performed By: Physician STONE III, HOYT E., PA-C Debridement Type: Debridement Severity of Tissue Pre Fat layer exposed Debridement: Level of Consciousness (Pre- Awake and Alert procedure): Pre-procedure Verification/Time Yes - 13:37 Out Taken: Start Time: 13:37 Pain Control: Lidocaine Total Area Debrided (L x W): 0.8 (cm) x 0.3 (cm) = 0.24 (cm) Tissue and other material Viable, Non-Viable, Callus, Slough, Subcutaneous, Slough debrided: Level: Skin/Subcutaneous Tissue Debridement Description: Excisional Instrument: Curette Bleeding: None End Time: 13:41 Procedural Pain: 0 Post Procedural Pain: 0 Response to Treatment: Procedure was tolerated well Level of Consciousness Awake and  Alert (Post-procedure): Post Debridement Measurements of Total Wound Length: (cm) 0.9 Width: (cm) 0.4 Depth: (cm) 0.2 Volume: (cm) 0.057 Character of Wound/Ulcer Post Debridement: Improved Severity of Tissue Post Debridement: Fat layer exposed Post Procedure Diagnosis Same as Pre-procedure Electronic Signature(s) Signed: 11/01/2018 10:02:40 AM By: Worthy Keeler PA-C Signed: 11/01/2018 2:41:13 PM By: Harold Barban Entered By: Harold Barban on 10/26/2018 13:39:45 Simson, Roanne A. ([id number]) -------------------------------------------------------------------------------- HPI Details Patient Name: Heather Dear A. Date of Service: 10/26/2018 1:00 PM Medical Record Number: [id number] Patient Account Number: [id number] Date of Birth/Sex: May 11, 1976 (43 y.o. F) Treating RN: Montey Hora Primary Care Provider: Priscille Kluver Other Clinician: Referring Provider: Priscille Kluver Treating Provider/Extender: Melburn Hake, HOYT Weeks in Treatment: 11 History of Present Illness HPI Description: 01/18/18-She is here for initial evaluation of the left great toe ulcer. She is a poor historian in regards to timeframe in detail. She states approximately 4 weeks ago she lacerated her toe on something in the house. She followed up with her primary care who placed her on Bactrim and ultimately a second dose of Bactrim prior to coming to wound clinic. She states she has been treating the toe with peroxide, Betadine and a Band-Aid. She did not check her blood sugar this morning but checked it yesterday morning it was 327; she is unaware of a recent A1c and there are no current records. She saw Dr. she would've orthopedics last week for an old injury to the left ankle, she states he did not see her toe, nor did she bring it to his attention. She smokes approximately 1 pack cigarettes a day. Her social situation is concerning, she arrives this morning with her mother who appears extremely intoxicated/under the  influence; her mother was asked to leave the room and be monitored by the patient's grandmother. The patient's aunt then accompanied the patient and the room throughout the rest of the appointment. We had a lengthy discussion regarding the deleterious effects of uncontrolled hyperglycemia and smoking as it relates to wound healing  and overall health. She was strongly encouraged to decrease her smoking and get her diabetes under better control. She states she is currently on a diet and has cut down her [facility] consumption. The left toe is erythematous, macerated and slightly edematous with malodor present. The edema in her left foot is below her baseline, there is no erythema streaking. We will treat her with Santyl, doxycycline; we have ordered and xray, culture and provided a Peg assist surgical shoe and cultured the wound. 01/25/18-She is here in follow-up evaluation for a left great toe ulcer and presents with an abscess to her suprapubic area. She states her blood sugars remain elevated, feeling "sick" and if levels are below 250, but she is trying. She has made no attempt to decrease her smoking stating that we "can't take away her food in her cigarettes". She has been compliant with offloading using the PEG assist you. She is using Santyl daily. the culture obtained last week grew staph aureus and Enterococcus faecalis; continues on the doxycycline and Augmentin was added on Monday. The suprapubic area has erythema, no femoral variation, purple discoloration, minimal induration, was accessed with a cotton tip applicator with sanguinopurulent drainage, this was cultured, I suspect the current antibiotic treatment will cover and we will not add anything to her current treatment plan. She was advised to go to urgent care or ER with any change in redness, induration or fever. 02/01/18-She is here in follow-up evaluation for left great toe ulcers and a new abdominal abscess from last week. She  was able to use packing until earlier this week, where she "forgot it was there". She states she was feeling ill with GI symptoms last week and was not taking her antibiotic. She states her glucose levels have been predominantly less than 200, with occasional levels between 200-250. She thinks this was contributing to her GI symptoms as they have resolved without intervention. There continues to be significant laceration to left toe, otherwise it clinically looks stable/improved. There is now less superficial opening to the lateral aspect of the great toe that was residual blister. We will transition to [facility] to all wounds, she will continue her Augmentin. If there is no change or deterioration next week for reculture. 02/08/18-She is here in follow-up evaluation for left great toe ulcer and abdominal ulcer. There is an improvement in both wounds. She has been wrapping her left toe with coban, not by our direction, which has created an area of discoloration to the medial aspect; she has been advised to NOT use coban secondary to her neuropathy. She states her glucose levels have been high over this last week ranging from 200-350, she continues to smoke. She admits to being less compliant with her offloading shoe. We will continue with same treatment plan and she will follow-up next week. 02/15/18-She is here in follow-up evaluation for left great toe ulcer and abdominal ulcer. The abdominal ulcer is epithelialized. The left great toe ulcer is improved and all injury from last week using the Coban wrap is resolved, the lateral ulcer is healed. She admits to noncompliance with wearing offloading shoe and admits to glucose levels being greater than 300 most of the week. She continues to smoke and expresses no desire to quit. There is one area medially that probes deeper than it has historically, erythema to the toe and dorsal foot has consistently waxed and waned. There is no overt signs of  cellulitis or infection but we will culture the wound for any  occult infection given the new area of depth and erythema. We will hold off on sensitivities for initiation of antibiotic therapy. 02/22/18-She is here in follow up evaluation for left great toe ulcer. There is overall significant improvement in both wound appearance, erythema and edema with changes made last week. She was not initiated on antibiotic therapy. Culture obtained last week showed oxacillin sensitive staph aureus, sensitive to clindamycin. Clindamycin has been called into the pharmacy but she has been instructed to hold off on initiation secondary to overall clinical improvement and her history of antibiotic intolerance. She has been instructed to contact the clinic with any noted changes/deterioration and the wound, erythema, Barrales, Dreamer A. ([id number]) edema and/or pain. She will follow-up next week. She continues to smoke and her glucose levels remain elevated >250; she admits to compliance with offloading shoe 03/01/18 on evaluation today patient appears to be doing fairly well in regard to her left first toe ulcer. She has been tolerating the dressing changes with the [facility] Dressing without complication and overall this has definitely showed signs of improvement according to records as well is what the patient tells me today. I'm very pleased in that regard. She is having no pain today 03/08/18 She is here for follow up evaluation of a left great toe ulcer. She remains non-compliant with glucose control and smoking cessation; glucose levels consistently >200. She states that she got new shoe inserts/peg assist. She admits to compliance with offloading. Since my last evaluation there is significant improvement. We will switch to prisma at this time and she will follow up next week. She is noted to be tachycardic at this appointment, heart rate 120s; she has a history of heart rate 70-130 according to our records.  She admits to extreme agitation r/t personal issues; she was advised to monitor her heartrate and contact her physician if it does not return to a more normal range (<100). She takes cardizem twice daily. 03/15/18-She is here in follow-up evaluation for left great toe ulcer. She remains noncompliant with glucose control and smoking cessation. She admits to compliance with wearing offloading shoe. The ulcer is improved/stable and we will continue with the same treatment plan and she will follow-up next week 03/22/18-She is here for evaluation for left great toe ulcer. There continues to be significant improvement despite recurrent hyperglycemia (over 500 yesterday) and she continues to smoke. She has been compliant with offloading and we will continue with same treatment plan and she will follow-up next week. 03/29/18-She is here for evaluation for left great toe ulcer. Despite continuing to smoke and uncontrolled diabetes she continues to improve. She is compliant with offloading shoe. We will continue with the same treatment plan and she will follow-up next week 04/05/18- She is here in follow up evaluation for a left great toe ulcer; she presents with small pustule to left fifth toe (resembles ant bite). She admits to compliance with wearing offloading shoe; continues to smoke or have uncontrolled blood glucose control. There is more callus than usual with evidence of bleeding; she denies known trauma. 04/12/18-She is here for evaluation of left great toe ulcer. Despite noncompliance with glycemic control and smoking she continues to make improvement. She continues to wear offloading shoe. The pustule, that was identified last week, to the left fifth toe is resolved. She will follow-up in 2 weeks 05/03/18-she is seen in follow-up evaluation for a left great toe ulcer. She is compliant with offloading, otherwise noncompliant with glycemic control and smoking. She has  plateaued and there is minimal  improvement noted. We will transition to [facility], replaced the insert to her surgical shoe and she will follow-up in one week 05/10/18- She is here in follow up evaluation for a left great toe ulcer. It appears stable despite measurement change. We will continue with same treatment plan and follow up next week. 05/24/18-She is seen in follow-up evaluation for a left great toe ulcer. She remains compliant with offloading, has made significant improvement in her diet, decreasing the amount of sugar/soda. She said her recent A1c was 10.9 which is lower than. She did see a diabetic nutritionist/educator yesterday. She continues to smoke. We will continue with the same treatment plan and she'll follow-up next week. 05/31/18- She is seen in follow-up evaluation for left great toe ulcer. She continues to remain compliant with offloading, continues to make improvement in her diet, increasing her water and decreasing the amount of sugar/soda. She does continue to smoke with no desire to quit. We will apply Prisma to the depth and Hydrofera Blue over. We have not received insurance authorization for oasis. She will follow up next week. 06/07/18-She is seen in follow-up evaluation for left great toe ulcer. It has stalled according to today's measurements although base appears stable. She says she saw a diabetic educator yesterday; her average blood sugars are less than 300 which is an improvement for her. She continues to smoke and states "that's my next step" She continues with water over soda. We will order for xray, culture and reinstate ace wrap compression prior to placing apligraf for next week. She is voicing no complaints or concerns. Her dressing will change to iodoflex over the next week in preparation for apligraf. 06/14/18-She is seen in follow-up evaluation for left great toe ulcer. Plain film x-ray performed last week was negative for osteomyelitis. Wound culture obtained last week grew strep B  and OSSA; she is initiated on keflex and cefdinir today; there is erythema to the toe which could be from ace wrap compression, she has a history of wrapping too tight and has has been encouraged to maintain ace wraps that we place today. We will hold off on application of apligraf today, will apply next week after antibiotic therapy has been initiated. She admits today that she has resumed taking a shower with her foot/toe submerged in water, she has been reminded to keep foot/toe out of the bath water. She will be seen in follow up next week 06/21/18-she is seen in follow-up evaluation for left great toe ulcer. She is tolerating antibiotic therapy with no GI disturbance. The wound is stable. Apligraf was applied today. She has been decreasing her smoking, only had 4 cigarettes yesterday and 1 today. She continues being more compliant in diabetic diet. She will follow-up next week for evaluation of site, if stable will remove at 2 weeks. 06/28/18- She is here in follow up evalution. Apligraf was placed last week, she states the dressing fell off on Tuesday and she was dressing with hydrofera blue. She is healed and will be discharged from the clinic today. She has been instructed to continue with smoking cessation, continue monitoring glucose levels, offloading for an additional 4 weeks and continue with Cirelli, Jaslene A. ([id number]) hydrofera blue for additional two weeks for any possible microscopic opening. Readmission: 08/07/18 on evaluation today patient presents for reevaluation concerning the ulcer of her right great toe. She was previously discharged on 06/28/18 healed. Nonetheless she states that this began to show signs of drainage  she subsequently went to her primary care provider. Subsequently an x-ray was performed on 08/01/18 which was negative. The patient was also placed on antibiotics at that time. Fortunately they should have been effective for the infection. Nonetheless she's been  experiencing some improvement but still has a lot of drainage coming from the wound itself. 08/14/18 on evaluation today patient's wound actually does show signs of improvement in regard to the erythema at this point. She has completed the antibiotics. With that being said we did discuss the possibility of placing her in a total contact cast as of today although I think that I may want to give this just a little bit more time to ensure nothing recurrence as far as her infection is concerned. I do not want to put in the cast and risk infection at that time if things are not completely resolved. With that being said she is gonna require some debridement today. 08/21/18 on evaluation today patient actually appears to be doing okay in regard to her toe ulcer. She's been tolerating the dressing changes without complication. With that being said it does appear that she is ready and in fact I think it's appropriate for [place] to go ahead and initiate the total contact cast today. Nonetheless she will require some sharp debridement to prepare the wound for application. Overall I feel like things have been progressing well but we do need to do something to get this to close more readily. 08/24/18 patient seen today for reevaluation after having had the total contact cast applied on Tuesday. She seems to have done very well the wound appears to be doing great and overall I'm pleased with the progress that she's made. There were no abnormal areas of rubbing from the cast on her lower extremity. 08/30/18 on evaluation today patient actually appears to be completely healed in regard to her plantar toe ulcer. She tells me at this point she's been having a lot of issues with the cast. She almost fell a couple of times the state shall the step of her dog a couple times as well. This is been a very frustrating process for her other nonetheless she has completely healed the wound which is excellent news. Overall there  does not appear to be the evidence of infection at this time which is great news. 09/11/18 evaluation today patient presents for follow-up concerning her great toe ulcer on the left which has unfortunately reopened since I last saw her which was only a couple of weeks ago. Unfortunately she was not able to get in to get the shoe and potentially the AFO that's gonna be necessary due to her left foot drop. She continues with offloading shoe but this is not enough to prevent her from reopening it appears. When we last had her in the total contact cast she did well from a healing standpoint but unfortunately the wound reopened as soon as she came out of the cast within just a couple of weeks. 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 This is also part of the reason why she falls on a regular basis. I just do not believe that is gonna be safe for [place] to reinitiate the total contact cast as last time we had this on she fell 3 times  one week which is definitely not normal for her. 09/18/18 upon evaluation today the patient actually appears to be doing about the same in regard to her toe ulcer. She did not contact [employer] as I asked her to even though I had given her the prescription. In fact she actually states that she has no idea where the prescription is. She did apparently call [employer] and they told her that all she needed to do was bring the prescription in order to be able to be seen and work on getting the AFO for her left foot. With all that being said she still does not have an appointment and I'm not sure were things stand that regard. I will give her a new prescription today in order to contact them to get this set up. 09/25/18 on evaluation today patient actually appears to be doing about the same in  regard to her toes ulcer. She does have a small areas which seems to have a lot of callous buildup around the edge of the wound which is going to need sharp debridement today. She still is waiting to be scheduled for evaluation with [employer] for possibility of an AFO. She states there supposed to call her tomorrow to get this set up. Unfortunately it does appear that her foot specifically the toe area is showing signs of erythema. There does not appear to be any systemic infection which is in these good news. 10/02/18 on evaluation today patient actually appears to be doing about the same in regard to her toe ulcer. This really has not done too well although it's not significantly larger it's also not significantly smaller. She has been tolerating the dressing changes without complication. She actually has her appointment with [employer] and [facility] tomorrow to hopefully be measured for obtaining and AFO splint. I think this would be helpful preventing this from reoccurring. We had contemplated starting the cast this week although to be honest I am reluctant to do that as she's been having nausea, vomiting, and seizure activity over the past three days. She has a history of seizures and have been told is nothing that can be done for these. With Wargo, Aslee A. ([id number]) that being said I do believe that along with the seizures have the nausea vomiting which upon further questioning doesn't seem to be the normal for her and makes me concerned for the possibility of infection or something else going on. I discussed this with the patient and her mother during the office visit today. I do not feel the wound is effective but maybe something else. The responses this was "this just happens to her at times and we don't know why". They did not seem to be interested in going to the hospital to have this checked out further. 10/09/18 on evaluation today patient presents for follow-up concerning her ongoing  toe ulcer. She has been tolerating the dressing changes without complication. Fortunately there does not appear to be any evidence of infection which is great news however I do think that the patient would benefit from going ahead for with the total contact cast. She's actually in a wheelchair today she tells me that she will use her walker if we initiate the cast. I was very specific about the fact that if we were gonna do the cast I wanted to make sure that she was using the walker in order to prevent any falls. She tells me she does not have stairs that she has to traverse on a regular  basis at her home. She has not had any seizures since last week again that something that happens to her often she tells me she did talk to [employer] and they said that it may take up to three weeks to get the brace approved for her. Hopefully that will not take that long but nonetheless in the meantime I do think the cast could be of benefit. 10/12/18 on evaluation today patient appears to be doing rather well in regard to her toe ulcer. It's just been a few days and already this is significantly improved both as far as overall appearance and size. Fortunately there's no sign of infection. She is here for her first obligatory cast change. 10/19/18 Seen today for follow up and management of left great toe ulcer. Wound continues to show improvement. Noted small open area with seroussang drainage with palpation. Denies any increased pain or recent fevers during visit. She will continue calcium alginate with offloading shoe. Denies any questions or concerns during visit. 10/26/18 on evaluation today patient appears to be doing about the same as when I last saw her in regard to her wound bed. Fortunately there does not appear to be any signs of infection. Unfortunately she continues to have a breakdown in regard to the toe region any time that she is not in the cast. It takes almost no time at all for this to happen.  Nonetheless she still has not heard anything from the brace being made by [employer] as to when exactly this will be available to her. Fortunately there is no signs of infection at this time. Electronic Signature(s) Signed: 11/01/2018 10:02:40 AM By: Worthy Keeler PA-C Entered By: Worthy Keeler on 10/30/2018 17:28:55 [name] ([id number]) -------------------------------------------------------------------------------- Physical Exam Details Patient Name: Heather Dear A. Date of Service: 10/26/2018 1:00 PM Medical Record Number: [id number] Patient Account Number: [id number] Date of Birth/Sex: 11-21-1975 (43 y.o. F) Treating RN: Montey Hora Primary Care Provider: Priscille Kluver Other Clinician: Referring Provider: Priscille Kluver Treating Provider/Extender: STONE III, HOYT Weeks in Treatment: 104 Constitutional Well-nourished and well-hydrated in no acute distress. Respiratory normal breathing without difficulty. Psychiatric this patient is able to make decisions and demonstrates good insight into disease process. Alert and Oriented x 3. pleasant and cooperative. Notes Patient's wound bed did require sharp debridement today which was performed without complication post debridement the wound bed appears to be doing excellent. Overall I'm very pleased with how things stand. Electronic Signature(s) Signed: 11/01/2018 10:02:40 AM By: Worthy Keeler PA-C Entered By: Worthy Keeler on 10/30/2018 17:29:31 [name] ([id number]) -------------------------------------------------------------------------------- Physician Orders Details Patient Name: Heather Dear A. Date of Service: 10/26/2018 1:00 PM Medical Record Number: [id number] Patient Account Number: [id number] Date of Birth/Sex: 1976/08/29 (43 y.o. 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 Primary Wound Dressing Wound #4R Left Toe Great o Silver Alginate Secondary Dressing Wound #4R Left Toe Great o Gauze and Kerlix/Conform - Foam Dressing Change Frequency Wound #4R Left Toe Great o Change Dressing Monday, Wednesday, Friday Follow-up Appointments Wound #4R Left Toe Great o Return Appointment in 1 week. -  Also, schedule appointment for Tuesday, January 7 anytime Edema Control Wound #4R Left Toe Great o Elevate legs to the level of the heart and pump ankles as often as possible Off-Loading Wound #4R Left Toe Great o Open toe surgical shoe to: Electronic Signature(s) Signed: 11/01/2018 10:02:40 AM By: Worthy Keeler PA-C Signed: 11/01/2018 2:41:13 PM By: Harold Barban Entered By: Harold Barban on 10/26/2018 13:41:48 [name] ([id number]) [name], Kamariya A. ([id number]) -------------------------------------------------------------------------------- Problem List Details Patient Name: Heather Dear A. Date of Service: 10/26/2018 1:00 PM Medical Record Number: [id number] Patient Account Number: [id number] Date of Birth/Sex: Jun 17, 1976 (43 y.o. 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 ([id number]) -------------------------------------------------------------------------------- Progress Note Details Patient Name: Heather Dear A. Date of Service: 10/26/2018 1:00 PM Medical Record Number: [id number] Patient Account Number: [id number] Date of Birth/Sex: Mar 19, 1976 (43 y.o. F) Treating RN: Montey Hora Primary Care Provider: Priscille Kluver Other Clinician: Referring Provider: Priscille Kluver Treating Provider/Extender: Melburn Hake, HOYT Weeks in Treatment: 11 Subjective Chief Complaint Information obtained from Patient Left great toe ulcer History of Present Illness (HPI) 01/18/18-She is here for initial evaluation of the left great toe ulcer. She is a poor historian in regards to timeframe in detail. She states approximately 4 weeks ago she lacerated her toe on something in the house. She followed up with her primary care who placed her on Bactrim and ultimately a second dose of Bactrim prior to coming to wound clinic. She states she has been treating the toe with peroxide, Betadine and a Band-Aid. She did not check her blood sugar this morning but checked it yesterday morning it was 327; she is unaware of a recent A1c and there are no current records. She saw Dr. she would've orthopedics last week for an old injury to the left ankle, she states he did not see her toe, nor did she bring it to his attention. She smokes approximately 1 pack cigarettes a day. Her social situation is concerning, she arrives this morning with her mother who appears extremely intoxicated/under the influence; her mother was asked to leave the room and be monitored by the patient's grandmother. The patient's aunt  then accompanied the patient and the room throughout the rest of the appointment. We had a lengthy discussion regarding the deleterious effects of uncontrolled hyperglycemia and smoking as it relates to wound healing and overall health. She was strongly encouraged to decrease her smoking and get her diabetes under better control. She states she is currently on a diet and has cut down her [facility] consumption. The left toe is erythematous, macerated and slightly edematous with malodor present. The edema in her left foot is below her baseline, there is no erythema streaking. We will treat her with Santyl, doxycycline; we have ordered and xray, culture and provided a Peg assist surgical shoe and cultured the wound. 01/25/18-She is here in follow-up evaluation for a left  great toe ulcer and presents with an abscess to her suprapubic area. She states her blood sugars remain elevated, feeling "sick" and if levels are below 250, but she is trying. She has made no attempt to decrease her smoking stating that we "can't take away her food in her cigarettes". She has been compliant with offloading using the PEG assist you. She is using Santyl daily. the culture obtained last week grew staph aureus and Enterococcus faecalis; continues on the doxycycline and Augmentin was added on Monday. The suprapubic area has erythema, no femoral variation, purple discoloration, minimal induration, was accessed with a cotton tip applicator with sanguinopurulent drainage, this was cultured, I suspect the current antibiotic treatment will cover and we will not add anything to her current treatment plan. She was advised to go to urgent care or ER with any change in redness, induration or fever. 02/01/18-She is here in follow-up evaluation for left great toe ulcers and a new abdominal abscess from last week. She was able to use packing until earlier this week, where she "forgot it was there". She states she was feeling ill with  GI symptoms last week and was not taking her antibiotic. She states her glucose levels have been predominantly less than 200, with occasional levels between 200-250. She thinks this was contributing to her GI symptoms as they have resolved without intervention. There continues to be significant laceration to left toe, otherwise it clinically looks stable/improved. There is now less superficial opening to the lateral aspect of the great toe that was residual blister. We will transition to [facility] to all wounds, she will continue her Augmentin. If there is no change or deterioration next week for reculture. 02/08/18-She is here in follow-up evaluation for left great toe ulcer and abdominal ulcer. There is an improvement in both wounds. She has been wrapping her left toe with coban, not by our direction, which has created an area of discoloration to the medial aspect; she has been advised to NOT use coban secondary to her neuropathy. She states her glucose levels have been high over this last week ranging from 200-350, she continues to smoke. She admits to being less compliant with her offloading shoe. We will continue with same treatment plan and she will follow-up next week. 02/15/18-She is here in follow-up evaluation for left great toe ulcer and abdominal ulcer. The abdominal ulcer is epithelialized. The left great toe ulcer is improved and all injury from last week using the Coban wrap is resolved, the lateral ulcer is healed. She admits to noncompliance with wearing offloading shoe and admits to glucose levels being greater than 300 most of the week. She continues to smoke and expresses no desire to quit. There is one area medially that probes deeper than it has historically, erythema to the toe and dorsal foot has consistently waxed and waned. There is no overt signs of cellulitis or infection but we will culture the wound for any occult infection given the new area of depth and erythema. We  will hold off on Dorn, Twylla A. ([id number]) sensitivities for initiation of antibiotic therapy. 02/22/18-She is here in follow up evaluation for left great toe ulcer. There is overall significant improvement in both wound appearance, erythema and edema with changes made last week. She was not initiated on antibiotic therapy. Culture obtained last week showed oxacillin sensitive staph aureus, sensitive to clindamycin. Clindamycin has been called into the pharmacy but she has been instructed to hold off on initiation secondary to overall clinical  improvement and her history of antibiotic intolerance. She has been instructed to contact the clinic with any noted changes/deterioration and the wound, erythema, edema and/or pain. She will follow-up next week. She continues to smoke and her glucose levels remain elevated >250; she admits to compliance with offloading shoe 03/01/18 on evaluation today patient appears to be doing fairly well in regard to her left first toe ulcer. She has been tolerating the dressing changes with the [facility] Dressing without complication and overall this has definitely showed signs of improvement according to records as well is what the patient tells me today. I'm very pleased in that regard. She is having no pain today 03/08/18 She is here for follow up evaluation of a left great toe ulcer. She remains non-compliant with glucose control and smoking cessation; glucose levels consistently >200. She states that she got new shoe inserts/peg assist. She admits to compliance with offloading. Since my last evaluation there is significant improvement. We will switch to prisma at this time and she will follow up next week. She is noted to be tachycardic at this appointment, heart rate 120s; she has a history of heart rate 70-130 according to our records. She admits to extreme agitation r/t personal issues; she was advised to monitor her heartrate and contact her physician if it  does not return to a more normal range (<100). She takes cardizem twice daily. 03/15/18-She is here in follow-up evaluation for left great toe ulcer. She remains noncompliant with glucose control and smoking cessation. She admits to compliance with wearing offloading shoe. The ulcer is improved/stable and we will continue with the same treatment plan and she will follow-up next week 03/22/18-She is here for evaluation for left great toe ulcer. There continues to be significant improvement despite recurrent hyperglycemia (over 500 yesterday) and she continues to smoke. She has been compliant with offloading and we will continue with same treatment plan and she will follow-up next week. 03/29/18-She is here for evaluation for left great toe ulcer. Despite continuing to smoke and uncontrolled diabetes she continues to improve. She is compliant with offloading shoe. We will continue with the same treatment plan and she will follow-up next week 04/05/18- She is here in follow up evaluation for a left great toe ulcer; she presents with small pustule to left fifth toe (resembles ant bite). She admits to compliance with wearing offloading shoe; continues to smoke or have uncontrolled blood glucose control. There is more callus than usual with evidence of bleeding; she denies known trauma. 04/12/18-She is here for evaluation of left great toe ulcer. Despite noncompliance with glycemic control and smoking she continues to make improvement. She continues to wear offloading shoe. The pustule, that was identified last week, to the left fifth toe is resolved. She will follow-up in 2 weeks 05/03/18-she is seen in follow-up evaluation for a left great toe ulcer. She is compliant with offloading, otherwise noncompliant with glycemic control and smoking. She has plateaued and there is minimal improvement noted. We will transition to [facility], replaced the insert to her surgical shoe and she will follow-up in one  week 05/10/18- She is here in follow up evaluation for a left great toe ulcer. It appears stable despite measurement change. We will continue with same treatment plan and follow up next week. 05/24/18-She is seen in follow-up evaluation for a left great toe ulcer. She remains compliant with offloading, has made significant improvement in her diet, decreasing the amount of sugar/soda. She said her recent A1c  was 10.9 which is lower than. She did see a diabetic nutritionist/educator yesterday. She continues to smoke. We will continue with the same treatment plan and she'll follow-up next week. 05/31/18- She is seen in follow-up evaluation for left great toe ulcer. She continues to remain compliant with offloading, continues to make improvement in her diet, increasing her water and decreasing the amount of sugar/soda. She does continue to smoke with no desire to quit. We will apply Prisma to the depth and Hydrofera Blue over. We have not received insurance authorization for oasis. She will follow up next week. 06/07/18-She is seen in follow-up evaluation for left great toe ulcer. It has stalled according to today's measurements although base appears stable. She says she saw a diabetic educator yesterday; her average blood sugars are less than 300 which is an improvement for her. She continues to smoke and states "that's my next step" She continues with water over soda. We will order for xray, culture and reinstate ace wrap compression prior to placing apligraf for next week. She is voicing no complaints or concerns. Her dressing will change to iodoflex over the next week in preparation for apligraf. 06/14/18-She is seen in follow-up evaluation for left great toe ulcer. Plain film x-ray performed last week was negative for osteomyelitis. Wound culture obtained last week grew strep B and OSSA; she is initiated on keflex and cefdinir today; there is erythema to the toe which could be from ace wrap compression,  she has a history of wrapping too tight and has has been encouraged to maintain ace wraps that we place today. We will hold off on application of apligraf today, will apply next week after antibiotic therapy has been initiated. She admits today that she has resumed taking a shower with her foot/toe submerged in water, she has been reminded to keep foot/toe out of the bath water. She will be seen in follow up next week 06/21/18-she is seen in follow-up evaluation for left great toe ulcer. She is tolerating antibiotic therapy with no GI disturbance. The wound is stable. Apligraf was applied today. She has been decreasing her smoking, only had 4 cigarettes yesterday and Carriero, Sonnet A. ([id number]) 1 today. She continues being more compliant in diabetic diet. She will follow-up next week for evaluation of site, if stable will remove at 2 weeks. 06/28/18- She is here in follow up evalution. Apligraf was placed last week, she states the dressing fell off on Tuesday and she was dressing with hydrofera blue. She is healed and will be discharged from the clinic today. She has been instructed to continue with smoking cessation, continue monitoring glucose levels, offloading for an additional 4 weeks and continue with hydrofera blue for additional two weeks for any possible microscopic opening. Readmission: 08/07/18 on evaluation today patient presents for reevaluation concerning the ulcer of her right great toe. She was previously discharged on 06/28/18 healed. Nonetheless she states that this began to show signs of drainage she subsequently went to her primary care provider. Subsequently an x-ray was performed on 08/01/18 which was negative. The patient was also placed on antibiotics at that time. Fortunately they should have been effective for the infection. Nonetheless she's been experiencing some improvement but still has a lot of drainage coming from the wound itself. 08/14/18 on evaluation today patient's  wound actually does show signs of improvement in regard to the erythema at this point. She has completed the antibiotics. With that being said we did discuss the possibility of placing her in  a total contact cast as of today although I think that I may want to give this just a little bit more time to ensure nothing recurrence as far as her infection is concerned. I do not want to put in the cast and risk infection at that time if things are not completely resolved. With that being said she is gonna require some debridement today. 08/21/18 on evaluation today patient actually appears to be doing okay in regard to her toe ulcer. She's been tolerating the dressing changes without complication. With that being said it does appear that she is ready and in fact I think it's appropriate for [place] to go ahead and initiate the total contact cast today. Nonetheless she will require some sharp debridement to prepare the wound for application. Overall I feel like things have been progressing well but we do need to do something to get this to close more readily. 08/24/18 patient seen today for reevaluation after having had the total contact cast applied on Tuesday. She seems to have done very well the wound appears to be doing great and overall I'm pleased with the progress that she's made. There were no abnormal areas of rubbing from the cast on her lower extremity. 08/30/18 on evaluation today patient actually appears to be completely healed in regard to her plantar toe ulcer. She tells me at this point she's been having a lot of issues with the cast. She almost fell a couple of times the state shall the step of her dog a couple times as well. This is been a very frustrating process for her other nonetheless she has completely healed the wound which is excellent news. Overall there does not appear to be the evidence of infection at this time which is great news. 09/11/18 evaluation today patient presents for  follow-up concerning her great toe ulcer on the left which has unfortunately reopened since I last saw her which was only a couple of weeks ago. Unfortunately she was not able to get in to get the shoe and potentially the AFO that's gonna be necessary due to her left foot drop. She continues with offloading shoe but this is not enough to prevent her from reopening it appears. When we last had her in the total contact cast she did well from a healing standpoint but unfortunately the wound reopened as soon as she came out of the cast within just a couple of weeks. 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 This is also part of the reason why she falls on a regular basis. I just do not believe that is gonna be safe for [place] to reinitiate the total contact cast as last time we had this on she fell 3 times one week which is definitely not normal for her. 09/18/18 upon evaluation today the patient actually appears to be doing about the same in regard to her toe ulcer. She did not contact [facility] as I asked her to even though I had given her the prescription. In fact she actually states that she has no idea where the prescription is. She did apparently call [facility] and they told her that all she needed to do was bring the prescription in order to be able to be seen and work on getting the AFO  for her left foot. With all that being said she still does not have an appointment and I'm not sure were things stand that regard. I will give her a new prescription today in order to contact them to get this set up. 09/25/18 on evaluation today patient actually appears to be doing about the same in regard to her toes ulcer. She does have a small areas which seems to have a lot of callous buildup around the edge of the wound  which is going to need sharp debridement today. She still is waiting to be scheduled for evaluation with [employer] for possibility of an AFO. She states there supposed to call her tomorrow to get this set up. Unfortunately it does appear that her foot specifically the toe area is showing signs of erythema. There does not appear to be any systemic infection which is in these good news. CARMELLE, BAMBERG A. ([id number]) 10/02/18 on evaluation today patient actually appears to be doing about the same in regard to her toe ulcer. This really has not done too well although it's not significantly larger it's also not significantly smaller. She has been tolerating the dressing changes without complication. She actually has her appointment with [facility] and [facility] tomorrow to hopefully be measured for obtaining and AFO splint. I think this would be helpful preventing this from reoccurring. We had contemplated starting the cast this week although to be honest I am reluctant to do that as she's been having nausea, vomiting, and seizure activity over the past three days. She has a history of seizures and have been told is nothing that can be done for these. With that being said I do believe that along with the seizures have the nausea vomiting which upon further questioning doesn't seem to be the normal for her and makes me concerned for the possibility of infection or something else going on. I discussed this with the patient and her mother during the office visit today. I do not feel the wound is effective but maybe something else. The responses this was "this just happens to her at times and we don't know why". They did not seem to be interested in going to the hospital to have this checked out further. 10/09/18 on evaluation today patient presents for follow-up concerning her ongoing toe ulcer. She has been tolerating the dressing changes without complication. Fortunately there does not appear to be any evidence  of infection which is great news however I do think that the patient would benefit from going ahead for with the total contact cast. She's actually in a wheelchair today she tells me that she will use her walker if we initiate the cast. I was very specific about the fact that if we were gonna do the cast I wanted to make sure that she was using the walker in order to prevent any falls. She tells me she does not have stairs that she has to traverse on a regular basis at her home. She has not had any seizures since last week again that something that happens to her often she tells me she did talk to [employer] and they said that it may take up to three weeks to get the brace approved for her. Hopefully that will not take that long but nonetheless in the meantime I do think the cast could be of benefit. 10/12/18 on evaluation today patient appears to be doing rather well in regard to her toe ulcer. It's just been a few days and already this  is significantly improved both as far as overall appearance and size. Fortunately there's no sign of infection. She is here for her first obligatory cast change. 10/19/18 Seen today for follow up and management of left great toe ulcer. Wound continues to show improvement. Noted small open area with seroussang drainage with palpation. Denies any increased pain or recent fevers during visit. She will continue calcium alginate with offloading shoe. Denies any questions or concerns during visit. 10/26/18 on evaluation today patient appears to be doing about the same as when I last saw her in regard to her wound bed. Fortunately there does not appear to be any signs of infection. Unfortunately she continues to have a breakdown in regard to the toe region any time that she is not in the cast. It takes almost no time at all for this to happen. Nonetheless she still has not heard anything from the brace being made by [employer] as to when exactly this will be available to her.  Fortunately there is no signs of infection at this time. Patient History Information obtained from Patient. Social History Current every day smoker. Review of Systems (ROS) Constitutional Symptoms (General Health) Denies complaints or symptoms of Fever, Chills. Respiratory The patient has no complaints or symptoms. Cardiovascular The patient has no complaints or symptoms. Psychiatric The patient has no complaints or symptoms. Deutschman, Lynnda A. ([id number]) Objective Constitutional Well-nourished and well-hydrated in no acute distress. Vitals Time Taken: 1:04 PM, Height: 69 in, Weight: 240 lbs, BMI: 35.4, Temperature: 98.7 F, Pulse: 93 bpm, Respiratory Rate: 16 breaths/min, Blood Pressure: 122/88 mmHg. Respiratory normal breathing without difficulty. Psychiatric this patient is able to make decisions and demonstrates good insight into disease process. Alert and Oriented x 3. pleasant and cooperative. General Notes: Patient's wound bed did require sharp debridement today which was performed without complication post debridement the wound bed appears to be doing excellent. Overall I'm very pleased with how things stand. Integumentary (Hair, Skin) Wound #4R status is Open. Original cause of wound was Gradually Appeared. The wound is located on the Left Toe Great. The wound measures 0.8cm length x 0.3cm width x 0.2cm depth; 0.188cm^2 area and 0.038cm^3 volume. There is Fat Layer (Subcutaneous Tissue) Exposed exposed. There is no tunneling noted, however, there is undermining starting at 6:00 and ending at 11:00 with a maximum distance of 0.2cm. There is a medium amount of serous drainage noted. The wound margin is flat and intact. There is large (67-100%) red granulation within the wound bed. There is a small (1-33%) amount of necrotic tissue within the wound bed including Adherent Slough. The periwound skin appearance exhibited: Scarring. The periwound skin appearance did not exhibit:  Callus, Crepitus, Excoriation, Induration, Rash, Dry/Scaly, Maceration, Atrophie Blanche, Cyanosis, Ecchymosis, Hemosiderin Staining, Mottled, Pallor, Rubor, Erythema. Periwound temperature was noted as No Abnormality. 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 There was a Excisional Skin/Subcutaneous Tissue Debridement with a total area of 0.24 sq cm performed by STONE III, HOYT E., PA-C. With the following instrument(s): Curette to remove Viable and Non-Viable tissue/material. Material removed includes Callus, Subcutaneous Tissue, and Slough after achieving pain control using Lidocaine. No specimens were taken. A time out was conducted at 13:37, prior  to the start of the procedure. There was no bleeding. The procedure was tolerated well with a pain level of 0 throughout and a Rolston, Keagan A. ([id number]) pain level of 0 following the procedure. Post Debridement Measurements: 0.9cm length x 0.4cm width x 0.2cm depth; 0.057cm^3 volume. Character of Wound/Ulcer Post Debridement is improved. Severity of Tissue Post Debridement is: Fat layer exposed. Post procedure Diagnosis Wound #4R: Same as Pre-Procedure Plan Wound Cleansing: Wound #4R Left Toe Great: Clean wound with Normal Saline. Primary Wound Dressing: Wound #4R Left Toe Great: Silver Alginate Secondary Dressing: Wound #4R Left Toe Great: Gauze and Kerlix/Conform - Foam Dressing Change Frequency: Wound #4R Left Toe Great: Change Dressing Monday, Wednesday, Friday Follow-up Appointments: Wound #4R Left Toe Great: Return Appointment in 1 week. - Also, schedule appointment for Tuesday, January 7  anytime Edema Control: Wound #4R Left Toe Great: Elevate legs to the level of the heart and pump ankles as often as possible Off-Loading: Wound #4R Left Toe Great: Open toe surgical shoe to: My suggestion currently is gonna be that we continue with the above wound care measures for the next week. Patient is in agreement with this plan. If anything changes or worsens in the meantime she will let me know. Please see above for specific wound care orders. We will see patient for re-evaluation in 1 week(s) here in the clinic. If anything worsens or changes patient will contact our office for additional recommendations. Electronic Signature(s) Signed: 11/01/2018 10:02:40 AM By: Worthy Keeler PA-C Entered By: Worthy Keeler on 10/30/2018 17:29:46 [name] ([id number]) -------------------------------------------------------------------------------- ROS/PFSH Details Patient Name: Heather Dear A. Date of Service: 10/26/2018 1:00 PM Medical Record Number: [id number] Patient Account Number: [id number] Date of Birth/Sex: May 10, 1976 (43 y.o. F) Treating RN: Montey Hora Primary Care Provider: Priscille Kluver Other Clinician: Referring Provider: Priscille Kluver Treating Provider/Extender: STONE III, HOYT Weeks in Treatment: 11 Information Obtained From Patient Wound History Do you currently have one or more open woundso Yes How many open wounds do you currently haveo 1 Constitutional Symptoms (General Health) Complaints and Symptoms: Negative for: Fever; Chills Eyes Medical History: Positive for: Cataracts; Glaucoma; Optic Neuritis Ear/Nose/Mouth/Throat Medical History: Positive for: Chronic sinus problems/congestion; Middle ear problems Respiratory Complaints and Symptoms: No Complaints or Symptoms Cardiovascular Complaints and Symptoms: No Complaints or Symptoms Endocrine Medical History: Positive for: Type II Diabetes Time with diabetes: 10 years Treated with: Insulin, Oral  agents Blood sugar tested every day: No Blood sugar testing results: Lunch: 350 Psychiatric Complaints and Symptoms: No Complaints or Symptoms HBO Extended History Items Kell, Sarabeth A. ([id number]) Eyes: Eyes: Ear/Nose/Mouth/Throat: Ear/Nose/Mouth/Throat: Cataracts Glaucoma Chronic sinus Middle ear problems problems/congestion Immunizations Pneumococcal Vaccine: Received Pneumococcal Vaccination: No Implantable Devices Family and Social History Current every day smoker Physician Affirmation I have reviewed and agree with the above information. Electronic Signature(s) Signed: 10/31/2018 5:48:26 PM By: Montey Hora Signed: 11/01/2018 10:02:40 AM By: Worthy Keeler PA-C Entered By: Worthy Keeler on 10/30/2018 17:29:10 Eble, Tayva AMarland Kitchen ([id number]) -------------------------------------------------------------------------------- SuperBill Details Patient Name: Heather Dear A. Date of Service: 10/26/2018 Medical Record Number: [id number] Patient Account Number: [id number] Date of Birth/Sex: 1976/02/05 (43 y.o. F) Treating RN: Montey Hora Primary Care Provider: Priscille Kluver Other Clinician: Referring Provider: Priscille Kluver Treating Provider/Extender: Melburn Hake, HOYT Weeks in Treatment: 11 Diagnosis Coding ICD-10 Codes Code Description E11.621 Type 2 diabetes mellitus with foot ulcer L97.522 Non-pressure chronic ulcer of other part of left foot with fat layer exposed M21.372 Foot drop, left foot Facility Procedures CPT4  Code: 16109604 Description: 54098 - DEB SUBQ TISSUE 20 SQ CM/< ICD-10 Diagnosis Description L97.522 Non-pressure chronic ulcer of other part of left foot with fat Modifier: layer exposed Quantity: 1 Physician Procedures CPT4 Code: 1191478 Description: 29562 - WC PHYS SUBQ TISS 20 SQ CM ICD-10 Diagnosis Description L97.522 Non-pressure chronic ulcer of other part of left foot with fat Modifier: layer exposed Quantity: 1 Electronic Signature(s) Signed:  11/01/2018 10:02:40 AM By: Worthy Keeler PA-C Entered By: Worthy Keeler on 10/28/2018 23:54:05

## 2018-11-02 NOTE — Progress Notes (Signed)
ANGLA, DELAHUNT ([id number]) Visit Report for 10/26/2018 Arrival Information Details Patient Name: [name], [name] Date of Service: 10/26/2018 1:00 PM Medical Record Number: [id number] Patient Account Number: [id number] Date of Birth/Sex: [birth year] (43 y.o. F) Treating RN: Montey Hora Primary Care Raquell Richer: Priscille Kluver Other Clinician: Referring Ziaire Hagos: Priscille Kluver Treating Letishia Elliott/Extender: Melburn Hake, HOYT Weeks in Treatment: 11 Visit Information History Since Last Visit Added or deleted any medications: No Patient Arrived: Wheel Chair Any new allergies or adverse reactions: No Arrival Time: 13:03 Had a fall or experienced change in No activities of daily living that may affect Accompanied By: mother risk of falls: Transfer Assistance: None Signs or symptoms of abuse/neglect since last visito No Patient Identification Verified: Yes Hospitalized since last visit: No Secondary Verification Process Completed: Yes Implantable device outside of the clinic excluding No Patient Requires Transmission-Based No cellular tissue based products placed in the center Precautions: since last visit: Patient Has Alerts: No Has Dressing in Place as Prescribed: Yes Pain Present Now: No Electronic Signature(s) Signed: 10/26/2018 4:39:33 PM By: Lorine Bears RCP, RRT, CHT Entered By: Lorine Bears on 10/26/2018 13:04:21 [name] ([id number]) -------------------------------------------------------------------------------- Encounter Discharge Information Details Patient Name: Heather Dear A. Date of Service: 10/26/2018 1:00 PM Medical Record Number: [id number] Patient Account Number: [id number] Date of Birth/Sex: Sep 08, 1976 (43 y.o. F) Treating RN: Harold Barban Primary Care Tamia Dial: Priscille Kluver Other Clinician: Referring Emili Mcloughlin: Priscille Kluver Treating Macklin Jacquin/Extender: Melburn Hake, HOYT Weeks in Treatment: 11 Encounter Discharge Information Items  Post Procedure Vitals Discharge Condition: Stable Temperature (F): 98.7 Ambulatory Status: Wheelchair Pulse (bpm): 95 Discharge Destination: Home Respiratory Rate (breaths/min): 16 Transportation: Private Auto Blood Pressure (mmHg): 122/88 Accompanied By: mother Schedule Follow-up Appointment: Yes Clinical Summary of Care: Electronic Signature(s) Signed: 11/01/2018 2:41:13 PM By: Harold Barban Entered By: Harold Barban on 10/26/2018 13:44:02 Manus, Jadin A. ([id number]) -------------------------------------------------------------------------------- Lower Extremity Assessment Details Patient Name: Heather Dear A. Date of Service: 10/26/2018 1:00 PM Medical Record Number: [id number] Patient Account Number: [id number] Date of Birth/Sex: 17-Jun-1976 (43 y.o. F) Treating RN: Montey Hora Primary Care Kaynan Klonowski: Priscille Kluver Other Clinician: Referring Alto Gandolfo: Priscille Kluver Treating Malyah Ohlrich/Extender: STONE III, HOYT Weeks in Treatment: 11 Vascular Assessment Pulses: Dorsalis Pedis Palpable: [Left:Yes] Posterior Tibial Extremity colors, hair growth, and conditions: Extremity Color: [Left:Normal] Hair Growth on Extremity: [Left:Yes] Temperature of Extremity: [Left:Warm] Capillary Refill: [Left:< 3 seconds] Toe Nail Assessment Left: Right: Thick: Yes Discolored: No Deformed: No Improper Length and Hygiene: No Electronic Signature(s) Signed: 10/26/2018 5:14:43 PM By: Montey Hora Entered By: Montey Hora on 10/26/2018 13:18:27 [name], [name] Kitchen ([id number]) -------------------------------------------------------------------------------- Multi Wound Chart Details Patient Name: Heather Dear A. Date of Service: 10/26/2018 1:00 PM Medical Record Number: [id number] Patient Account Number: [id number] Date of Birth/Sex: 1976/09/10 (43 y.o. F) Treating RN: Harold Barban Primary Care Elliotte Marsalis: Priscille Kluver Other Clinician: Referring Ifrah Vest: Priscille Kluver Treating  Janesa Dockery/Extender: STONE III, HOYT Weeks in Treatment: 11 Vital Signs Height(in): 69 Pulse(bpm): 93 Weight(lbs): 240 Blood Pressure(mmHg): 122/88 Body Mass Index(BMI): 35 Temperature(F): 98.7 Respiratory Rate 16 (breaths/min): Photos: [4R:No Photos] [N/A:N/A] Wound Location: [4R:Left Toe Great] [N/A:N/A] Wounding Event: [4R:Gradually Appeared] [N/A:N/A] Primary Etiology: [4R:Diabetic Wound/Ulcer of the N/A Lower Extremity] Comorbid History: [4R:Cataracts, Glaucoma, Optic Neuritis, Chronic sinus problems/congestion, Middle ear problems, Type II Diabetes] [N/A:N/A] Date Acquired: [4R:07/24/2018] [N/A:N/A] Weeks of Treatment: [4R:11] [N/A:N/A] Wound Status: [4R:Open] [N/A:N/A] Wound Recurrence: [4R:Yes] [N/A:N/A] Pending Amputation on [4R:Yes] [N/A:N/A] Presentation: Measurements L x W x D [4R:0.8x0.3x0.2] [N/A:N/A] (cm) Area (cm) : [4R:0.188] [N/A:N/A] Volume (cm) : [4R:0.038] [N/A:N/A] %  Reduction in Area: [4R:87.90%] [N/A:N/A] % Reduction in Volume: [4R:75.60%] [N/A:N/A] Starting Position 1 [4R:6] (o'clock): Ending Position 1 [4R:11] (o'clock): Maximum Distance 1 (cm): [4R:0.2] Undermining: [4R:Yes] [N/A:N/A] Classification: [4R:Grade 2] [N/A:N/A] Exudate Amount: [4R:Medium] [N/A:N/A] Exudate Type: [4R:Serous] [N/A:N/A] Exudate Color: [4R:amber] [N/A:N/A] Wound Margin: [4R:Flat and Intact] [N/A:N/A] Granulation Amount: [4R:Large (67-100%)] [N/A:N/A] Granulation Quality: [4R:Red] [N/A:N/A] Necrotic Amount: [4R:Small (1-33%)] [N/A:N/A] Exposed Structures: [4R:Fat Layer (Subcutaneous Tissue) Exposed: Yes] [N/A:N/A] Fascia: No Tendon: No Muscle: No Joint: No Bone: No Epithelialization: None N/A N/A Periwound Skin Texture: Scarring: Yes N/A N/A Excoriation: No Induration: No Callus: No Crepitus: No Rash: No Periwound Skin Moisture: Maceration: No N/A N/A Dry/Scaly: No Periwound Skin Color: Atrophie Blanche: No N/A N/A Cyanosis: No Ecchymosis:  No Erythema: No Hemosiderin Staining: No Mottled: No Pallor: No Rubor: No Temperature: No Abnormality N/A N/A Tenderness on Palpation: No N/A N/A Wound Preparation: Ulcer Cleansing: N/A N/A Rinsed/Irrigated with Saline Topical Anesthetic Applied: Other: lidocaine 4% Treatment Notes Electronic Signature(s) Signed: 11/01/2018 2:41:13 PM By: Harold Barban Entered By: Harold Barban on 10/26/2018 13:30:34 [name] ([id number]) -------------------------------------------------------------------------------- [place] Details Patient Name: Heather Dear A. Date of Service: 10/26/2018 1:00 PM Medical Record Number: [id number] Patient Account Number: [id number] Date of Birth/Sex: 24-Oct-1976 (43 y.o. F) Treating RN: Harold Barban Primary Care Luretta Everly: Priscille Kluver Other Clinician: Referring Mackey Varricchio: Priscille Kluver Treating Shelvie Salsberry/Extender: Melburn Hake, HOYT Weeks in Treatment: 11 Active Inactive Abuse / Safety / Falls / Self Care Management Nursing Diagnoses: History of Falls Goals: Patient will not experience any injury related to falls Date Initiated: 08/07/2018 Target Resolution Date: 10/19/2018 Goal Status: Active Interventions: Assess fall risk on admission and as needed Notes: Orientation to the Wound Care Program Nursing Diagnoses: Knowledge deficit related to the wound healing center program Goals: Patient/caregiver will verbalize understanding of the [facility] Date Initiated: 08/07/2018 Target Resolution Date: 10/19/2018 Goal Status: Active Interventions: Provide education on orientation to the wound center Notes: Soft Tissue Infection Nursing Diagnoses: Impaired tissue integrity Goals: Patient will remain free of wound infection Date Initiated: 08/07/2018 Target Resolution Date: 10/19/2018 Goal Status: Active Interventions: Assess signs and symptoms of infection every visit [name], [name]  ([id number]) Notes: Wound/Skin Impairment Nursing Diagnoses: Impaired tissue integrity Goals: Ulcer/skin breakdown will heal within 14 weeks Date Initiated: 08/07/2018 Target Resolution Date: 10/19/2018 Goal Status: Active Interventions: Assess patient/caregiver ability to obtain necessary supplies Notes: Electronic Signature(s) Signed: 11/01/2018 2:41:13 PM By: Harold Barban Entered By: Harold Barban on 10/26/2018 13:30:24 [name], [name] ([id number]) -------------------------------------------------------------------------------- Pain Assessment Details Patient Name: Heather Dear A. Date of Service: 10/26/2018 1:00 PM Medical Record Number: [id number] Patient Account Number: [id number] Date of Birth/Sex: 09/13/1976 (43 y.o. F) Treating RN: Montey Hora Primary Care Silus Lanzo: Priscille Kluver Other Clinician: Referring Antoinne Spadaccini: Priscille Kluver Treating Robertta Halfhill/Extender: STONE III, HOYT Weeks in Treatment: 11 Active Problems Location of Pain Severity and Description of Pain Patient Has Paino No Site Locations Pain Management and Medication Current Pain Management: Electronic Signature(s) Signed: 10/26/2018 4:39:33 PM By: Paulla Fore, RRT, CHT Signed: 10/26/2018 5:14:43 PM By: Montey Hora Entered By: Lorine Bears on 10/26/2018 13:04:28 [name] ([id number]) -------------------------------------------------------------------------------- Patient/Caregiver Education Details Patient Name: Heather Dear A. Date of Service: 10/26/2018 1:00 PM Medical Record Number: [id number] Patient Account Number: [id number] Date of Birth/Gender: 15-Jan-1976 (43 y.o. F) Treating RN: Harold Barban Primary Care Physician: Priscille Kluver Other Clinician: Referring Physician: Priscille Kluver Treating Physician/Extender: Sharalyn Ink in Treatment: 11 Education Assessment Education Provided To: Patient Education  Topics  Provided Pressure: Handouts: Pressure Ulcers: Care and Offloading Methods: Demonstration, Explain/Verbal Responses: State content correctly Wound/Skin Impairment: Handouts: Caring for Your Ulcer Methods: Demonstration, Explain/Verbal Responses: State content correctly Electronic Signature(s) Signed: 11/01/2018 2:41:13 PM By: Harold Barban Entered By: Harold Barban on 10/26/2018 13:44:10 [name], [name] ([id number]) -------------------------------------------------------------------------------- Wound Assessment Details Patient Name: Heather Dear A. Date of Service: 10/26/2018 1:00 PM Medical Record Number: [id number] Patient Account Number: [id number] Date of Birth/Sex: 27-Aug-1976 (43 y.o. F) Treating RN: Montey Hora Primary Care Blondie Riggsbee: Priscille Kluver Other Clinician: Referring Crue Otero: Priscille Kluver Treating Kamorie Aldous/Extender: STONE III, HOYT Weeks in Treatment: 11 Wound Status Wound Number: [id number] Primary Diabetic Wound/Ulcer of the Lower Extremity Etiology: Wound Location: Left Toe Great Wound Open Wounding Event: Gradually Appeared Status: Date Acquired: 07/24/2018 Comorbid Cataracts, Glaucoma, Optic Neuritis, Chronic Weeks Of Treatment: 11 History: sinus problems/congestion, Middle ear problems, Clustered Wound: No Type II Diabetes Pending Amputation On Presentation Photos Photo Uploaded By: Montey Hora on 10/26/2018 13:40:13 Wound Measurements Length: (cm) 0.8 Width: (cm) 0.3 Depth: (cm) 0.2 Area: (cm) 0.188 Volume: (cm) 0.038 % Reduction in Area: 87.9% % Reduction in Volume: 75.6% Epithelialization: None Tunneling: No Undermining: Yes Starting Position (o'clock): 6 Ending Position (o'clock): 11 Maximum Distance: (cm) 0.2 Wound Description Classification: Grade 2 Wound Margin: Flat and Intact Exudate Amount: Medium Exudate Type: Serous Exudate Color: amber Foul Odor After Cleansing: No Slough/Fibrino Yes Wound Bed Granulation Amount: Large  (67-100%) Exposed Structure Granulation Quality: Red Fascia Exposed: No Necrotic Amount: Small (1-33%) Fat Layer (Subcutaneous Tissue) Exposed: Yes Necrotic Quality: Adherent Slough Tendon Exposed: No [name], [name] ([id number]) Muscle Exposed: No Joint Exposed: No Bone Exposed: No Periwound Skin Texture Texture Color No Abnormalities Noted: No No Abnormalities Noted: No Callus: No Atrophie Blanche: No Crepitus: No Cyanosis: No Excoriation: No Ecchymosis: No Induration: No Erythema: No Rash: No Hemosiderin Staining: No Scarring: Yes Mottled: No Pallor: No Moisture Rubor: No No Abnormalities Noted: No Dry / Scaly: No Temperature / Pain Maceration: No Temperature: No Abnormality Wound Preparation Ulcer Cleansing: Rinsed/Irrigated with Saline Topical Anesthetic Applied: Other: lidocaine 4%, Electronic Signature(s) Signed: 10/26/2018 5:14:43 PM By: Montey Hora Entered By: Montey Hora on 10/26/2018 13:18:07 [name] ([id number]) -------------------------------------------------------------------------------- Vitals Details Patient Name: Heather Dear A. Date of Service: 10/26/2018 1:00 PM Medical Record Number: [id number] Patient Account Number: [id number] Date of Birth/Sex: 03/21/1976 (43 y.o. F) Treating RN: Montey Hora Primary Care Feige Lowdermilk: Priscille Kluver Other Clinician: Referring Lonzie Simmer: Priscille Kluver Treating Roseline Ebarb/Extender: STONE III, HOYT Weeks in Treatment: 11 Vital Signs Time Taken: 13:04 Temperature (F): 98.7 Height (in): 69 Pulse (bpm): 93 Weight (lbs): 240 Respiratory Rate (breaths/min): 16 Body Mass Index (BMI): 35.4 Blood Pressure (mmHg): 122/88 Reference Range: 80 - 120 mg / dl Electronic Signature(s) Signed: 10/26/2018 4:39:33 PM By: Lorine Bears RCP, RRT, CHT Entered By: Lorine Bears on 10/26/2018 13:11:41

## 2018-11-05 NOTE — Progress Notes (Signed)
[name], [name] ([id number]) Visit Report for 11/02/2018 Arrival Information Details Patient Name: [name], [name] Date of Service: 11/02/2018 11:00 AM Medical Record Number: [id number] Patient Account Number: [id number] Date of Birth/Sex: 1976/01/26 (43 y.o. F) Treating RN: Montey Hora Primary Care Madellyn Denio: Priscille Kluver Other Clinician: Referring Hoy Fallert: Priscille Kluver Treating Dejah Droessler/Extender: Melburn Hake, HOYT Weeks in Treatment: 12 Visit Information History Since Last Visit Added or deleted any medications: Yes Patient Arrived: Wheel Chair Any new allergies or adverse reactions: No Arrival Time: 10:59 Had a fall or experienced change in No activities of daily living that may affect Accompanied By: mother risk of falls: Transfer Assistance: None Signs or symptoms of abuse/neglect since last visito No Patient Requires Transmission-Based No Hospitalized since last visit: No Precautions: Implantable device outside of the clinic excluding No Patient Has Alerts: No cellular tissue based products placed in the center since last visit: Has Dressing in Place as Prescribed: Yes Pain Present Now: No Electronic Signature(s) Signed: 11/02/2018 3:31:07 PM By: Lorine Bears RCP, RRT, CHT Entered By: Lorine Bears on 11/02/2018 11:00:12 [name] ([id number]) -------------------------------------------------------------------------------- Encounter Discharge Information Details Patient Name: Heather Dear A. Date of Service: 11/02/2018 11:00 AM Medical Record Number: [id number] Patient Account Number: [id number] Date of Birth/Sex: [birth year] (43 y.o. F) Treating RN: Montey Hora Primary Care Rudene Poulsen: Priscille Kluver Other Clinician: Referring Lino Wickliff: Priscille Kluver Treating Luci Bellucci/Extender: Melburn Hake, HOYT Weeks in Treatment: 12 Encounter Discharge Information Items Discharge Condition: Stable Ambulatory Status: Wheelchair Discharge  Destination: Home Transportation: Private Auto Accompanied By: mother Schedule Follow-up Appointment: Yes Clinical Summary of Care: Electronic Signature(s) Signed: 11/02/2018 3:29:00 PM By: Montey Hora Entered By: Montey Hora on 11/02/2018 11:26:37 [name] ([id number]) -------------------------------------------------------------------------------- Lower Extremity Assessment Details Patient Name: Heather Dear A. Date of Service: 11/02/2018 11:00 AM Medical Record Number: [id number] Patient Account Number: [id number] Date of Birth/Sex: 1976/04/10 (43 y.o. F) Treating RN: Cornell Barman Primary Care Esther Bradstreet: Priscille Kluver Other Clinician: Referring Thomas Rhude: Priscille Kluver Treating Dynastee Brummell/Extender: Melburn Hake, HOYT Weeks in Treatment: 12 Edema Assessment Assessed: [Left: No] [Right: No] Edema: [Left: N] [Right: o] Vascular Assessment Pulses: Dorsalis Pedis Palpable: [Left:Yes] Posterior Tibial Extremity colors, hair growth, and conditions: Extremity Color: [Left:Normal] Hair Growth on Extremity: [Left:Yes] Temperature of Extremity: [Left:Warm] Capillary Refill: [Left:< 3 seconds] Toe Nail Assessment Left: Right: Thick: No Discolored: No Deformed: No Improper Length and Hygiene: No Electronic Signature(s) Signed: 11/02/2018 3:19:28 PM By: Gretta Cool, BSN, RN, CWS, [name] RN, BSN Entered By: Gretta Cool, BSN, RN, CWS, [name] on 11/02/2018 11:13:04 [name] ([id number]) -------------------------------------------------------------------------------- Multi Wound Chart Details Patient Name: Heather Dear A. Date of Service: 11/02/2018 11:00 AM Medical Record Number: [id number] Patient Account Number: [id number] Date of Birth/Sex: [birth year] (43 y.o. 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] Comorbid History: [4R:Cataracts, Glaucoma, Optic Neuritis, Chronic sinus problems/congestion, Middle ear problems, Type II Diabetes] [N/A:N/A] Date Acquired: [4R:07/24/2018] [N/A:N/A] Weeks of Treatment: [4R:12] [N/A:N/A] Wound Status: [4R:Open] [N/A:N/A] Wound Recurrence: [4R:Yes] [N/A:N/A] Pending Amputation on [4R:Yes] [N/A:N/A] Presentation: Measurements L x W x D [4R:0.5x0.2x0.5] [N/A:N/A] (cm) Area (cm) : [4R:0.079] [N/A:N/A] Volume (cm) : [4R:0.039] [N/A:N/A] % Reduction in Area: [4R:94.90%] [N/A:N/A] %  Reduction in Volume: [4R:75.00%] [N/A:N/A] Starting Position 1 [4R:12] (o'clock): Ending Position 1 [4R:12] (o'clock): Maximum Distance 1 (cm): [4R:0.4] Undermining: [4R:Yes] [N/A:N/A] Classification: [4R:Grade 2] [N/A:N/A] Exudate Amount: [4R:Medium] [N/A:N/A] Exudate Type: [4R:Serous] [N/A:N/A] Exudate Color: [4R:amber] [N/A:N/A] Wound Margin: [4R:Flat and Intact] [N/A:N/A] Granulation Amount: [4R:Large (67-100%)] [N/A:N/A] Granulation Quality: [4R:Red] [N/A:N/A] Necrotic Amount: [4R:Small (1-33%)] [N/A:N/A] Exposed Structures: [4R:Fat Layer (Subcutaneous Tissue) Exposed: Yes] [N/A:N/A] Fascia: No Tendon: No Muscle: No Joint: No Bone: No Epithelialization: None N/A N/A Periwound Skin Texture: Scarring: Yes N/A N/A Excoriation: No Induration: No Callus: No Crepitus: No Rash: No Periwound Skin Moisture: Maceration: No N/A N/A Dry/Scaly: No Periwound Skin Color: Atrophie Blanche: No N/A N/A Cyanosis: No Ecchymosis: No Erythema: No Hemosiderin Staining: No Mottled: No Pallor: No Rubor: No Temperature: No Abnormality  N/A N/A Tenderness on Palpation: No N/A N/A Wound Preparation: Ulcer Cleansing: N/A N/A Rinsed/Irrigated with Saline Topical Anesthetic Applied: Other: lidocaine 4% Treatment Notes Electronic Signature(s) Signed: 11/02/2018 3:29:00 PM By: Montey Hora Entered By: Montey Hora on 11/02/2018 11:23:25 [name] ([id number]) -------------------------------------------------------------------------------- [place] Details Patient Name: Heather Dear A. Date of Service: 11/02/2018 11:00 AM Medical Record Number: [id number] Patient Account Number: [id number] Date of Birth/Sex: September 12, 1976 (43 y.o. F) Treating RN: Montey Hora Primary Care Tyreisha Ungar: Priscille Kluver Other Clinician: Referring Othmar Ringer: Priscille Kluver Treating Laruen Risser/Extender: Melburn Hake, HOYT Weeks in Treatment: 12 Active Inactive Abuse / Safety / Falls / Self Care Management Nursing Diagnoses: History of Falls Goals: Patient will not experience any injury related to falls Date Initiated: 08/07/2018 Target Resolution Date: 10/19/2018 Goal Status: Active Interventions: Assess fall risk on admission and as needed Notes: Orientation to the Wound Care Program Nursing Diagnoses: Knowledge deficit related to the wound healing center program Goals: Patient/caregiver will verbalize understanding of the [facility] Date Initiated: 08/07/2018 Target Resolution Date: 10/19/2018 Goal Status: Active Interventions: Provide education on orientation to the wound center Notes: Soft Tissue Infection Nursing Diagnoses: Impaired tissue integrity Goals: Patient will remain free of wound infection Date Initiated: 08/07/2018 Target Resolution Date: 10/19/2018 Goal Status: Active Interventions: Assess signs and symptoms of infection every visit [name], [name] ([id number]) Notes: Wound/Skin Impairment Nursing Diagnoses: Impaired tissue integrity Goals: Ulcer/skin breakdown will  heal within 14 weeks Date Initiated: 08/07/2018 Target Resolution Date: 10/19/2018 Goal Status: Active Interventions: Assess patient/caregiver ability to obtain necessary supplies Notes: Electronic Signature(s) Signed: 11/02/2018 3:29:00 PM By: Montey Hora Entered By: Montey Hora on 11/02/2018 11:23:17 [name], [name] ([id number]) -------------------------------------------------------------------------------- Pain Assessment Details Patient Name: Heather Dear A. Date of Service: 11/02/2018 11:00 AM Medical Record Number: [id number] Patient Account Number: [id number] Date of Birth/Sex: 1976/01/17 (43 y.o. F) Treating RN: Montey Hora Primary Care Kyeisha Janowicz: Priscille Kluver Other Clinician: Referring Rider Ermis: Priscille Kluver Treating Zetha Kuhar/Extender: STONE III, HOYT Weeks in Treatment: 12 Active Problems Location of Pain Severity and Description of Pain Patient Has Paino No Site Locations Pain Management and Medication Current Pain Management: Electronic Signature(s) Signed: 11/02/2018 3:29:00 PM By: Montey Hora Signed: 11/02/2018 3:31:07 PM By: Lorine Bears RCP, RRT, CHT Entered By: Lorine Bears on 11/02/2018 11:00:20 [name] ([id number]) -------------------------------------------------------------------------------- Patient/Caregiver Education Details Patient Name: Heather Dear A. Date of Service: 11/02/2018 11:00 AM Medical Record Number: [id number] Patient Account Number: [id number] Date of Birth/Gender: 03/07/1976 (43 y.o. F) Treating RN: Montey Hora Primary Care Physician: Priscille Kluver Other Clinician: Referring Physician: Priscille Kluver Treating Physician/Extender: Sharalyn Ink in Treatment: 12 Education Assessment Education Provided To: Patient and Caregiver Education Topics Provided Offloading: Handouts:  Other: TCC precautions Methods: Explain/Verbal Responses: State content correctly Electronic  Signature(s) Signed: 11/02/2018 3:29:00 PM By: Montey Hora Entered By: Montey Hora on 11/02/2018 11:27:05 [name] ([id number]) -------------------------------------------------------------------------------- Wound Assessment Details Patient Name: Heather Dear A. Date of Service: 11/02/2018 11:00 AM Medical Record Number: [id number] Patient Account Number: [id number] Date of Birth/Sex: October 03, 1976 (43 y.o. F) Treating RN: Cornell Barman Primary Care Filippo Puls: Priscille Kluver Other Clinician: Referring Bryn Saline: Priscille Kluver Treating Jimmylee Ratterree/Extender: STONE III, HOYT Weeks in Treatment: 12 Wound Status Wound Number: 4R Primary Diabetic Wound/Ulcer of the Lower Extremity Etiology: Wound Location: Left Toe Great Wound Open Wounding Event: Gradually Appeared Status: Date Acquired: 07/24/2018 Comorbid Cataracts, Glaucoma, Optic Neuritis, Chronic Weeks Of Treatment: 12 History: sinus problems/congestion, Middle ear problems, Clustered Wound: No Type II Diabetes Pending Amputation On Presentation Wound Measurements Length: (cm) 0.5 Width: (cm) 0.2 Depth: (cm) 0.5 Area: (cm) 0.079 Volume: (cm) 0.039 % Reduction in Area: 94.9% % Reduction in Volume: 75% Epithelialization: None Tunneling: No Undermining: Yes Starting Position (o'clock): 12 Ending Position (o'clock): 12 Maximum Distance: (cm) 0.4 Wound Description Classification: Grade 2 Wound Margin: Flat and Intact Exudate Amount: Medium Exudate Type: Serous Exudate Color: amber Foul Odor After Cleansing: No Slough/Fibrino Yes Wound Bed Granulation Amount: Large (67-100%) Exposed Structure Granulation Quality: Red Fascia Exposed: No Necrotic Amount: Small (1-33%) Fat Layer (Subcutaneous Tissue) Exposed: Yes Necrotic Quality: Adherent Slough Tendon Exposed: No Muscle Exposed: No Joint Exposed: No Bone Exposed: No Periwound Skin Texture Texture Color No Abnormalities Noted: No No Abnormalities Noted:  No Callus: No Atrophie Blanche: No Crepitus: No Cyanosis: No Excoriation: No Ecchymosis: No Induration: No Erythema: No Rash: No Hemosiderin Staining: No Scarring: Yes Mottled: No [name], [name] ([id number]) Moisture Pallor: No No Abnormalities Noted: No Rubor: No Dry / Scaly: No Temperature / Pain Maceration: No Temperature: No Abnormality Wound Preparation Ulcer Cleansing: Rinsed/Irrigated with Saline Topical Anesthetic Applied: Other: lidocaine 4%, Treatment Notes Wound #4R (Left Toe Great) Notes prisma, foam, TCC applied today Electronic Signature(s) Signed: 11/02/2018 3:19:28 PM By: Gretta Cool, BSN, RN, CWS, [name] RN, BSN Entered By: Gretta Cool, BSN, RN, CWS, [name] on 11/02/2018 11:11:59 [name] ([id number]) -------------------------------------------------------------------------------- Vitals Details Patient Name: Heather Dear A. Date of Service: 11/02/2018 11:00 AM Medical Record Number: [id number] Patient Account Number: [id number] Date of Birth/Sex: May 31, 1976 (43 y.o. F) Treating RN: Montey Hora Primary Care Evlyn Amason: Priscille Kluver Other Clinician: Referring Chalene Treu: Priscille Kluver Treating Cresencio Reesor/Extender: STONE III, HOYT Weeks in Treatment: 12 Vital Signs Time Taken: 11:02 Temperature (F): 98.3 Height (in): 69 Pulse (bpm): 84 Weight (lbs): 240 Respiratory Rate (breaths/min): 16 Body Mass Index (BMI): 35.4 Blood Pressure (mmHg): 105/67 Reference Range: 80 - 120 mg / dl Electronic Signature(s) Signed: 11/02/2018 3:31:07 PM By: Lorine Bears RCP, RRT, CHT Entered By: Lorine Bears on 11/02/2018 11:09:39

## 2018-11-05 NOTE — Progress Notes (Addendum)
TONETTE, KOEHNE ([id number]) Visit Report for 11/02/2018 Chief Complaint Document Details Patient Name: [name], [name] Date of Service: 11/02/2018 11:00 AM Medical Record Number: [id number] Patient Account Number: [id number] Date of Birth/Sex: [birth year] (43 y.o. F) Treating RN: Montey Hora Primary Care Provider: Priscille Kluver Other Clinician: Referring Provider: Priscille Kluver Treating Provider/Extender: Melburn Hake, Loida Calamia Weeks in Treatment: 12 Information Obtained from: Patient Chief Complaint Left great toe ulcer Electronic Signature(s) Signed: 11/05/2018 7:30:10 AM By: Worthy Keeler PA-C Entered By: Worthy Keeler on 11/02/2018 11:06:39 [name] ([id number]) -------------------------------------------------------------------------------- Debridement Details Patient Name: Heather Dear A. Date of Service: 11/02/2018 11:00 AM Medical Record Number: [id number] Patient Account Number: [id number] Date of Birth/Sex: 05/31/1976 (43 y.o. F) Treating RN: Montey Hora Primary Care Provider: Priscille Kluver Other Clinician: Referring Provider: Priscille Kluver Treating Provider/Extender: STONE III, Silvia Hightower Weeks in Treatment: 12 Debridement Performed for Wound #4R Left Toe Great Assessment: Performed By: Physician STONE III, Percy Winterrowd E., PA-C Debridement Type: Debridement Severity of Tissue Pre Fat layer exposed Debridement: Level of Consciousness (Pre- Awake and Alert procedure): Pre-procedure Verification/Time Yes - 11:21 Out Taken: Start Time: 11:21 Pain Control: Lidocaine 4% Topical Solution Total Area Debrided (L x W): 0.5 (cm) x 0.2 (cm) = 0.1 (cm) Tissue and other material Viable, Non-Viable, Callus, Slough, Subcutaneous, Slough debrided: Level: Skin/Subcutaneous Tissue Debridement Description: Excisional Instrument: Curette Bleeding: Minimum Hemostasis Achieved: Pressure End Time: 11:27 Procedural Pain: 0 Post Procedural Pain: 0 Response to Treatment: Procedure  was tolerated well Level of Consciousness Awake and Alert (Post-procedure): Post Debridement Measurements of Total Wound Length: (cm) 0.6 Width: (cm) 0.3 Depth: (cm) 0.4 Volume: (cm) 0.057 Character of Wound/Ulcer Post Debridement: Improved Severity of Tissue Post Debridement: Fat layer exposed Post Procedure Diagnosis Same as Pre-procedure Electronic Signature(s) Signed: 11/02/2018 3:29:00 PM By: Montey Hora Signed: 11/05/2018 7:30:10 AM By: Worthy Keeler PA-C Entered By: Montey Hora on 11/02/2018 11:27:29 Heather Dear A. ([id number]) -------------------------------------------------------------------------------- HPI Details Patient Name: Heather Dear A. Date of Service: 11/02/2018 11:00 AM Medical Record Number: [id number] Patient Account Number: [id number] Date of Birth/Sex: 09-16-1976 (43 y.o. F) Treating RN: Montey Hora Primary Care Provider: Priscille Kluver Other Clinician: Referring Provider: Priscille Kluver Treating Provider/Extender: Melburn Hake, Akshaj Besancon Weeks in Treatment: 12 History of Present Illness HPI Description: 01/18/18-She is here for initial evaluation of the left great toe ulcer. She is a poor historian in regards to timeframe in detail. She states approximately 4 weeks ago she lacerated her toe on something in the house. She followed up with her primary care who placed her on Bactrim and ultimately a second dose of Bactrim prior to coming to wound clinic. She states she has been treating the toe with peroxide, Betadine and a Band-Aid. She did not check her blood sugar this morning but checked it yesterday morning it was 327; she is unaware of a recent A1c and there are no current records. She saw Dr. she would've orthopedics last week for an old injury to the left ankle, she states he did not see her toe, nor did she bring it to his attention. She smokes approximately 1 pack cigarettes a day. Her social situation is concerning, she arrives this morning with  her mother who appears extremely intoxicated/under the influence; her mother was asked to leave the room and be monitored by the patient's grandmother. The patient's aunt then accompanied the patient and the room throughout the rest of the appointment. We had a lengthy discussion regarding the deleterious effects of uncontrolled hyperglycemia and smoking  as it relates to wound healing and overall health. She was strongly encouraged to decrease her smoking and get her diabetes under better control. She states she is currently on a diet and has cut down her [facility] consumption. The left toe is erythematous, macerated and slightly edematous with malodor present. The edema in her left foot is below her baseline, there is no erythema streaking. We will treat her with Santyl, doxycycline; we have ordered and xray, culture and provided a Peg assist surgical shoe and cultured the wound. 01/25/18-She is here in follow-up evaluation for a left great toe ulcer and presents with an abscess to her suprapubic area. She states her blood sugars remain elevated, feeling "sick" and if levels are below 250, but she is trying. She has made no attempt to decrease her smoking stating that we "can't take away her food in her cigarettes". She has been compliant with offloading using the PEG assist you. She is using Santyl daily. the culture obtained last week grew staph aureus and Enterococcus faecalis; continues on the doxycycline and Augmentin was added on Monday. The suprapubic area has erythema, no femoral variation, purple discoloration, minimal induration, was accessed with a cotton tip applicator with sanguinopurulent drainage, this was cultured, I suspect the current antibiotic treatment will cover and we will not add anything to her current treatment plan. She was advised to go to urgent care or ER with any change in redness, induration or fever. 02/01/18-She is here in follow-up evaluation for left great toe  ulcers and a new abdominal abscess from last week. She was able to use packing until earlier this week, where she "forgot it was there". She states she was feeling ill with GI symptoms last week and was not taking her antibiotic. She states her glucose levels have been predominantly less than 200, with occasional levels between 200-250. She thinks this was contributing to her GI symptoms as they have resolved without intervention. There continues to be significant laceration to left toe, otherwise it clinically looks stable/improved. There is now less superficial opening to the lateral aspect of the great toe that was residual blister. We will transition to [facility] to all wounds, she will continue her Augmentin. If there is no change or deterioration next week for reculture. 02/08/18-She is here in follow-up evaluation for left great toe ulcer and abdominal ulcer. There is an improvement in both wounds. She has been wrapping her left toe with coban, not by our direction, which has created an area of discoloration to the medial aspect; she has been advised to NOT use coban secondary to her neuropathy. She states her glucose levels have been high over this last week ranging from 200-350, she continues to smoke. She admits to being less compliant with her offloading shoe. We will continue with same treatment plan and she will follow-up next week. 02/15/18-She is here in follow-up evaluation for left great toe ulcer and abdominal ulcer. The abdominal ulcer is epithelialized. The left great toe ulcer is improved and all injury from last week using the Coban wrap is resolved, the lateral ulcer is healed. She admits to noncompliance with wearing offloading shoe and admits to glucose levels being greater than 300 most of the week. She continues to smoke and expresses no desire to quit. There is one area medially that probes deeper than it has historically, erythema to the toe and dorsal foot has  consistently waxed and waned. There is no overt signs of cellulitis or infection but we  will culture the wound for any occult infection given the new area of depth and erythema. We will hold off on sensitivities for initiation of antibiotic therapy. 02/22/18-She is here in follow up evaluation for left great toe ulcer. There is overall significant improvement in both wound appearance, erythema and edema with changes made last week. She was not initiated on antibiotic therapy. Culture obtained last week showed oxacillin sensitive staph aureus, sensitive to clindamycin. Clindamycin has been called into the pharmacy but she has been instructed to hold off on initiation secondary to overall clinical improvement and her history of antibiotic intolerance. She has been instructed to contact the clinic with any noted changes/deterioration and the wound, erythema, Mariner, Elin A. ([id number]) edema and/or pain. She will follow-up next week. She continues to smoke and her glucose levels remain elevated >250; she admits to compliance with offloading shoe 03/01/18 on evaluation today patient appears to be doing fairly well in regard to her left first toe ulcer. She has been tolerating the dressing changes with the [facility] Dressing without complication and overall this has definitely showed signs of improvement according to records as well is what the patient tells me today. I'm very pleased in that regard. She is having no pain today 03/08/18 She is here for follow up evaluation of a left great toe ulcer. She remains non-compliant with glucose control and smoking cessation; glucose levels consistently >200. She states that she got new shoe inserts/peg assist. She admits to compliance with offloading. Since my last evaluation there is significant improvement. We will switch to prisma at this time and she will follow up next week. She is noted to be tachycardic at this appointment, heart rate 120s; she has a  history of heart rate 70-130 according to our records. She admits to extreme agitation r/t personal issues; she was advised to monitor her heartrate and contact her physician if it does not return to a more normal range (<100). She takes cardizem twice daily. 03/15/18-She is here in follow-up evaluation for left great toe ulcer. She remains noncompliant with glucose control and smoking cessation. She admits to compliance with wearing offloading shoe. The ulcer is improved/stable and we will continue with the same treatment plan and she will follow-up next week 03/22/18-She is here for evaluation for left great toe ulcer. There continues to be significant improvement despite recurrent hyperglycemia (over 500 yesterday) and she continues to smoke. She has been compliant with offloading and we will continue with same treatment plan and she will follow-up next week. 03/29/18-She is here for evaluation for left great toe ulcer. Despite continuing to smoke and uncontrolled diabetes she continues to improve. She is compliant with offloading shoe. We will continue with the same treatment plan and she will follow-up next week 04/05/18- She is here in follow up evaluation for a left great toe ulcer; she presents with small pustule to left fifth toe (resembles ant bite). She admits to compliance with wearing offloading shoe; continues to smoke or have uncontrolled blood glucose control. There is more callus than usual with evidence of bleeding; she denies known trauma. 04/12/18-She is here for evaluation of left great toe ulcer. Despite noncompliance with glycemic control and smoking she continues to make improvement. She continues to wear offloading shoe. The pustule, that was identified last week, to the left fifth toe is resolved. She will follow-up in 2 weeks 05/03/18-she is seen in follow-up evaluation for a left great toe ulcer. She is compliant with offloading, otherwise noncompliant with  glycemic control  and smoking. She has plateaued and there is minimal improvement noted. We will transition to [facility], replaced the insert to her surgical shoe and she will follow-up in one week 05/10/18- She is here in follow up evaluation for a left great toe ulcer. It appears stable despite measurement change. We will continue with same treatment plan and follow up next week. 05/24/18-She is seen in follow-up evaluation for a left great toe ulcer. She remains compliant with offloading, has made significant improvement in her diet, decreasing the amount of sugar/soda. She said her recent A1c was 10.9 which is lower than. She did see a diabetic nutritionist/educator yesterday. She continues to smoke. We will continue with the same treatment plan and she'll follow-up next week. 05/31/18- She is seen in follow-up evaluation for left great toe ulcer. She continues to remain compliant with offloading, continues to make improvement in her diet, increasing her water and decreasing the amount of sugar/soda. She does continue to smoke with no desire to quit. We will apply Prisma to the depth and Hydrofera Blue over. We have not received insurance authorization for oasis. She will follow up next week. 06/07/18-She is seen in follow-up evaluation for left great toe ulcer. It has stalled according to today's measurements although base appears stable. She says she saw a diabetic educator yesterday; her average blood sugars are less than 300 which is an improvement for her. She continues to smoke and states "that's my next step" She continues with water over soda. We will order for xray, culture and reinstate ace wrap compression prior to placing apligraf for next week. She is voicing no complaints or concerns. Her dressing will change to iodoflex over the next week in preparation for apligraf. 06/14/18-She is seen in follow-up evaluation for left great toe ulcer. Plain film x-ray performed last week was negative  for osteomyelitis. Wound culture obtained last week grew strep B and OSSA; she is initiated on keflex and cefdinir today; there is erythema to the toe which could be from ace wrap compression, she has a history of wrapping too tight and has has been encouraged to maintain ace wraps that we place today. We will hold off on application of apligraf today, will apply next week after antibiotic therapy has been initiated. She admits today that she has resumed taking a shower with her foot/toe submerged in water, she has been reminded to keep foot/toe out of the bath water. She will be seen in follow up next week 06/21/18-she is seen in follow-up evaluation for left great toe ulcer. She is tolerating antibiotic therapy with no GI disturbance. The wound is stable. Apligraf was applied today. She has been decreasing her smoking, only had 4 cigarettes yesterday and 1 today. She continues being more compliant in diabetic diet. She will follow-up next week for evaluation of site, if stable will remove at 2 weeks. 06/28/18- She is here in follow up evalution. Apligraf was placed last week, she states the dressing fell off on Tuesday and she was dressing with hydrofera blue. She is healed and will be discharged from the clinic today. She has been instructed to continue with smoking cessation, continue monitoring glucose levels, offloading for an additional 4 weeks and continue with Rosenzweig, Sherece A. ([id number]) hydrofera blue for additional two weeks for any possible microscopic opening. Readmission: 08/07/18 on evaluation today patient presents for reevaluation concerning the ulcer of her right great toe. She was previously discharged on 06/28/18 healed. Nonetheless she states that this  began to show signs of drainage she subsequently went to her primary care provider. Subsequently an x-ray was performed on 08/01/18 which was negative. The patient was also placed on antibiotics at that time. Fortunately they should  have been effective for the infection. Nonetheless she's been experiencing some improvement but still has a lot of drainage coming from the wound itself. 08/14/18 on evaluation today patient's wound actually does show signs of improvement in regard to the erythema at this point. She has completed the antibiotics. With that being said we did discuss the possibility of placing her in a total contact cast as of today although I think that I may want to give this just a little bit more time to ensure nothing recurrence as far as her infection is concerned. I do not want to put in the cast and risk infection at that time if things are not completely resolved. With that being said she is gonna require some debridement today. 08/21/18 on evaluation today patient actually appears to be doing okay in regard to her toe ulcer. She's been tolerating the dressing changes without complication. With that being said it does appear that she is ready and in fact I think it's appropriate for [place] to go ahead and initiate the total contact cast today. Nonetheless she will require some sharp debridement to prepare the wound for application. Overall I feel like things have been progressing well but we do need to do something to get this to close more readily. 08/24/18 patient seen today for reevaluation after having had the total contact cast applied on Tuesday. She seems to have done very well the wound appears to be doing great and overall I'm pleased with the progress that she's made. There were no abnormal areas of rubbing from the cast on her lower extremity. 08/30/18 on evaluation today patient actually appears to be completely healed in regard to her plantar toe ulcer. She tells me at this point she's been having a lot of issues with the cast. She almost fell a couple of times the state shall the step of her dog a couple times as well. This is been a very frustrating process for her other nonetheless she has  completely healed the wound which is excellent news. Overall there does not appear to be the evidence of infection at this time which is great news. 09/11/18 evaluation today patient presents for follow-up concerning her great toe ulcer on the left which has unfortunately reopened since I last saw her which was only a couple of weeks ago. Unfortunately she was not able to get in to get the shoe and potentially the AFO that's gonna be necessary due to her left foot drop. She continues with offloading shoe but this is not enough to prevent her from reopening it appears. When we last had her in the total contact cast she did well from a healing standpoint but unfortunately the wound reopened as soon as she came out of the cast within just a couple of weeks. 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 This is also part of the reason why she falls on a regular basis. I just do not believe that is gonna be safe for [place] to reinitiate the total contact cast as last time we had  this on she fell 3 times one week which is definitely not normal for her. 09/18/18 upon evaluation today the patient actually appears to be doing about the same in regard to her toe ulcer. She did not contact [facility] as I asked her to even though I had given her the prescription. In fact she actually states that she has no idea where the prescription is. She did apparently call [facility] and they told her that all she needed to do was bring the prescription in order to be able to be seen and work on getting the AFO for her left foot. With all that being said she still does not have an appointment and I'm not sure were things stand that regard. I will give her a new prescription today in order to contact them to get this set up. 09/25/18 on  evaluation today patient actually appears to be doing about the same in regard to her toes ulcer. She does have a small areas which seems to have a lot of callous buildup around the edge of the wound which is going to need sharp debridement today. She still is waiting to be scheduled for evaluation with [facility] for possibility of an AFO. She states there supposed to call her tomorrow to get this set up. Unfortunately it does appear that her foot specifically the toe area is showing signs of erythema. There does not appear to be any systemic infection which is in these good news. 10/02/18 on evaluation today patient actually appears to be doing about the same in regard to her toe ulcer. This really has not done too well although it's not significantly larger it's also not significantly smaller. She has been tolerating the dressing changes without complication. She actually has her appointment with [facility] and [place] tomorrow to hopefully be measured for obtaining and AFO splint. I think this would be helpful preventing this from reoccurring. We had contemplated starting the cast this week although to be honest I am reluctant to do that as she's been having nausea, vomiting, and seizure activity over the past three days. She has a history of seizures and have been told is nothing that can be done for these. With Routzahn, Meliya A. ([id number]) that being said I do believe that along with the seizures have the nausea vomiting which upon further questioning doesn't seem to be the normal for her and makes me concerned for the possibility of infection or something else going on. I discussed this with the patient and her mother during the office visit today. I do not feel the wound is effective but maybe something else. The responses this was "this just happens to her at times and we don't know why". They did not seem to be interested in going to the hospital to have this checked out further. 10/09/18 on  evaluation today patient presents for follow-up concerning her ongoing toe ulcer. She has been tolerating the dressing changes without complication. Fortunately there does not appear to be any evidence of infection which is great news however I do think that the patient would benefit from going ahead for with the total contact cast. She's actually in a wheelchair today she tells me that she will use her walker if we initiate the cast. I was very specific about the fact that if we were gonna do the cast I wanted to make sure that she was using the walker in order to prevent any falls. She tells me she does not have stairs that she  has to traverse on a regular basis at her home. She has not had any seizures since last week again that something that happens to her often she tells me she did talk to [employer] and they said that it may take up to three weeks to get the brace approved for her. Hopefully that will not take that long but nonetheless in the meantime I do think the cast could be of benefit. 10/12/18 on evaluation today patient appears to be doing rather well in regard to her toe ulcer. It's just been a few days and already this is significantly improved both as far as overall appearance and size. Fortunately there's no sign of infection. She is here for her first obligatory cast change. 10/19/18 Seen today for follow up and management of left great toe ulcer. Wound continues to show improvement. Noted small open area with seroussang drainage with palpation. Denies any increased pain or recent fevers during visit. She will continue calcium alginate with offloading shoe. Denies any questions or concerns during visit. 10/26/18 on evaluation today patient appears to be doing about the same as when I last saw her in regard to her wound bed. Fortunately there does not appear to be any signs of infection. Unfortunately she continues to have a breakdown in regard to the toe region any time that she is  not in the cast. It takes almost no time at all for this to happen. Nonetheless she still has not heard anything from the brace being made by [employer] as to when exactly this will be available to her. Fortunately there is no signs of infection at this time. 10/30/18 on evaluation today patient presents for application of the total contact cast as we just received him this morning. Fortunately we are gonna be able to apply this to her today which is great news. She continues to have no significant pain which is good news. Overall I do feel like things have been improving while she was the cast is when she doesn't have a cast that things get worse. She still has not really heard anything from [place] regarding her brace. 11/02/18 upon evaluation today patient's wound already appears to be doing significantly better which is good news. Fortunately there does not appear to be any signs of infection also good news. Overall I do think the total contact cast as before is helping to heal this area unfortunately it's just not gonna likely keep the area closed and healed without her getting her brace at least. Again the foot drop is a significant issue for her. Electronic Signature(s) Signed: 11/11/2018 1:38:17 AM By: Worthy Keeler PA-C Entered By: Worthy Keeler on 11/11/2018 01:27:01 [name] ([id number]) -------------------------------------------------------------------------------- Physical Exam Details Patient Name: Heather Dear A. Date of Service: 11/02/2018 11:00 AM Medical Record Number: [id number] Patient Account Number: [id number] Date of Birth/Sex: 11/17/1975 (43 y.o. F) Treating RN: Montey Hora Primary Care Provider: Priscille Kluver Other Clinician: Referring Provider: Priscille Kluver Treating Provider/Extender: STONE III, Anala Whisenant Weeks in Treatment: 74 Constitutional Well-nourished and well-hydrated in no acute distress. Respiratory normal breathing without  difficulty. Psychiatric this patient is able to make decisions and demonstrates good insight into disease process. Alert and Oriented x 3. pleasant and cooperative. Notes Patient's wound bed today did require sharp debridement to remove necrotic material which he tolerated without, complication. With that being said following this the total contact cast was applied as before she tolerated this without any issues whatsoever. Electronic Signature(s) Signed: 11/11/2018 1:38:17 AM By:  Melburn Hake, Fatoumata Albaugh PA-C Entered By: Worthy Keeler on 11/11/2018 19:62:22 [name] ([id number]) -------------------------------------------------------------------------------- Physician Orders Details Patient Name: Heather Dear A. Date of Service: 11/02/2018 11:00 AM Medical Record Number: [id number] Patient Account Number: [id number] Date of Birth/Sex: 04/06/1976 (43 y.o. 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 Primary Wound Dressing Wound #4R Left Toe Great o Silver Collagen Secondary Dressing Wound #4R Left Toe Great o Foam Dressing Change Frequency Wound #4R Left Toe Great o Change dressing every week Follow-up Appointments Wound #4R Left Toe Great o Return Appointment in 1 week. Edema Control Wound #4R Left Toe Great o Elevate legs to the level of the heart and pump ankles as often as possible Off-Loading Wound #4R Left Toe Great o Total Contact Cast to Left Lower Extremity o Other: - PLEASE CALL [facility] ABOUT YOUR  AFO BRACE FOR YOUR FOOT Electronic Signature(s) Signed: 11/02/2018 3:29:00 PM By: Montey Hora Signed: 11/05/2018 7:30:10 AM By: Worthy Keeler PA-C Entered By: Montey Hora on 11/02/2018 11:28:16 [name] ([id number]) [name], [name] ([id number]) -------------------------------------------------------------------------------- Problem List Details Patient Name: Heather Dear A. Date of Service: 11/02/2018 11:00 AM Medical Record Number: [id number] Patient Account Number: [id number] Date of Birth/Sex: 11/12/1975 (43 y.o. F) Treating RN: Montey Hora Primary Care Provider: Priscille Kluver Other Clinician: Referring Provider: Priscille Kluver Treating Provider/Extender: Melburn Hake, Marcha Licklider Weeks in Treatment: 12 Active Problems ICD-10 Evaluated Encounter Code Description Active Date Today Diagnosis E11.621 Type 2 diabetes mellitus with foot ulcer 08/07/2018 No Yes L97.522 Non-pressure chronic ulcer of other part of left foot with fat 08/08/2018 No Yes layer exposed M21.372 Foot drop, left foot 09/11/2018 No Yes Inactive Problems Resolved Problems ICD-10 Code Description Active Date Resolved Date L03.032 Cellulitis of left toe 08/07/2018 08/07/2018 Electronic Signature(s) Signed: 11/05/2018 7:30:10 AM By: Worthy Keeler PA-C Entered By: Worthy Keeler on 11/02/2018 11:06:28 Mccleave, Samari A. ([id number]) -------------------------------------------------------------------------------- Progress Note Details Patient Name: Heather Dear A. Date of Service: 11/02/2018 11:00 AM Medical Record Number: [id number] Patient Account Number: [id number] Date of Birth/Sex: [birth year] (43 y.o. F) Treating RN: Montey Hora Primary Care Provider: Priscille Kluver Other Clinician: Referring Provider: Priscille Kluver Treating Provider/Extender: Melburn Hake, Kayelynn Abdou Weeks in Treatment: 12 Subjective Chief Complaint Information obtained from Patient Left great toe ulcer History of Present Illness  (HPI) 01/18/18-She is here for initial evaluation of the left great toe ulcer. She is a poor historian in regards to timeframe in detail. She states approximately 4 weeks ago she lacerated her toe on something in the house. She followed up with her primary care who placed her on Bactrim and ultimately a second dose of Bactrim prior to coming to wound clinic. She states she has been treating the toe with peroxide, Betadine and a Band-Aid. She did not check her blood sugar this morning but checked it yesterday morning it was 327; she is unaware of a recent A1c and there are no current records. She saw Dr. she would've orthopedics last week for an old injury to the left ankle, she states he did not see her toe, nor did she bring it  to his attention. She smokes approximately 1 pack cigarettes a day. Her social situation is concerning, she arrives this morning with her mother who appears extremely intoxicated/under the influence; her mother was asked to leave the room and be monitored by the patient's grandmother. The patient's aunt then accompanied the patient and the room throughout the rest of the appointment. We had a lengthy discussion regarding the deleterious effects of uncontrolled hyperglycemia and smoking as it relates to wound healing and overall health. She was strongly encouraged to decrease her smoking and get her diabetes under better control. She states she is currently on a diet and has cut down her [facility] consumption. The left toe is erythematous, macerated and slightly edematous with malodor present. The edema in her left foot is below her baseline, there is no erythema streaking. We will treat her with Santyl, doxycycline; we have ordered and xray, culture and provided a Peg assist surgical shoe and cultured the wound. 01/25/18-She is here in follow-up evaluation for a left great toe ulcer and presents with an abscess to her suprapubic area. She states her blood sugars remain  elevated, feeling "sick" and if levels are below 250, but she is trying. She has made no attempt to decrease her smoking stating that we "can't take away her food in her cigarettes". She has been compliant with offloading using the PEG assist you. She is using Santyl daily. the culture obtained last week grew staph aureus and Enterococcus faecalis; continues on the doxycycline and Augmentin was added on Monday. The suprapubic area has erythema, no femoral variation, purple discoloration, minimal induration, was accessed with a cotton tip applicator with sanguinopurulent drainage, this was cultured, I suspect the current antibiotic treatment will cover and we will not add anything to her current treatment plan. She was advised to go to urgent care or ER with any change in redness, induration or fever. 02/01/18-She is here in follow-up evaluation for left great toe ulcers and a new abdominal abscess from last week. She was able to use packing until earlier this week, where she "forgot it was there". She states she was feeling ill with GI symptoms last week and was not taking her antibiotic. She states her glucose levels have been predominantly less than 200, with occasional levels between 200-250. She thinks this was contributing to her GI symptoms as they have resolved without intervention. There continues to be significant laceration to left toe, otherwise it clinically looks stable/improved. There is now less superficial opening to the lateral aspect of the great toe that was residual blister. We will transition to [facility] to all wounds, she will continue her Augmentin. If there is no change or deterioration next week for reculture. 02/08/18-She is here in follow-up evaluation for left great toe ulcer and abdominal ulcer. There is an improvement in both wounds. She has been wrapping her left toe with coban, not by our direction, which has created an area of discoloration to the medial aspect;  she has been advised to NOT use coban secondary to her neuropathy. She states her glucose levels have been high over this last week ranging from 200-350, she continues to smoke. She admits to being less compliant with her offloading shoe. We will continue with same treatment plan and she will follow-up next week. 02/15/18-She is here in follow-up evaluation for left great toe ulcer and abdominal ulcer. The abdominal ulcer is epithelialized. The left great toe ulcer is improved and all injury from last week using the Coban  wrap is resolved, the lateral ulcer is healed. She admits to noncompliance with wearing offloading shoe and admits to glucose levels being greater than 300 most of the week. She continues to smoke and expresses no desire to quit. There is one area medially that probes deeper than it has historically, erythema to the toe and dorsal foot has consistently waxed and waned. There is no overt signs of cellulitis or infection but we will culture the wound for any occult infection given the new area of depth and erythema. We will hold off on [name], [name] ([id number]) sensitivities for initiation of antibiotic therapy. 02/22/18-She is here in follow up evaluation for left great toe ulcer. There is overall significant improvement in both wound appearance, erythema and edema with changes made last week. She was not initiated on antibiotic therapy. Culture obtained last week showed oxacillin sensitive staph aureus, sensitive to clindamycin. Clindamycin has been called into the pharmacy but she has been instructed to hold off on initiation secondary to overall clinical improvement and her history of antibiotic intolerance. She has been instructed to contact the clinic with any noted changes/deterioration and the wound, erythema, edema and/or pain. She will follow-up next week. She continues to smoke and her glucose levels remain elevated >250; she admits to compliance with offloading  shoe 03/01/18 on evaluation today patient appears to be doing fairly well in regard to her left first toe ulcer. She has been tolerating the dressing changes with the [facility] Dressing without complication and overall this has definitely showed signs of improvement according to records as well is what the patient tells me today. I'm very pleased in that regard. She is having no pain today 03/08/18 She is here for follow up evaluation of a left great toe ulcer. She remains non-compliant with glucose control and smoking cessation; glucose levels consistently >200. She states that she got new shoe inserts/peg assist. She admits to compliance with offloading. Since my last evaluation there is significant improvement. We will switch to prisma at this time and she will follow up next week. She is noted to be tachycardic at this appointment, heart rate 120s; she has a history of heart rate 70-130 according to our records. She admits to extreme agitation r/t personal issues; she was advised to monitor her heartrate and contact her physician if it does not return to a more normal range (<100). She takes cardizem twice daily. 03/15/18-She is here in follow-up evaluation for left great toe ulcer. She remains noncompliant with glucose control and smoking cessation. She admits to compliance with wearing offloading shoe. The ulcer is improved/stable and we will continue with the same treatment plan and she will follow-up next week 03/22/18-She is here for evaluation for left great toe ulcer. There continues to be significant improvement despite recurrent hyperglycemia (over 500 yesterday) and she continues to smoke. She has been compliant with offloading and we will continue with same treatment plan and she will follow-up next week. 03/29/18-She is here for evaluation for left great toe ulcer. Despite continuing to smoke and uncontrolled diabetes she continues to improve. She is compliant with offloading shoe. We  will continue with the same treatment plan and she will follow-up next week 04/05/18- She is here in follow up evaluation for a left great toe ulcer; she presents with small pustule to left fifth toe (resembles ant bite). She admits to compliance with wearing offloading shoe; continues to smoke or have uncontrolled blood glucose control. There is more callus than usual with  evidence of bleeding; she denies known trauma. 04/12/18-She is here for evaluation of left great toe ulcer. Despite noncompliance with glycemic control and smoking she continues to make improvement. She continues to wear offloading shoe. The pustule, that was identified last week, to the left fifth toe is resolved. She will follow-up in 2 weeks 05/03/18-she is seen in follow-up evaluation for a left great toe ulcer. She is compliant with offloading, otherwise noncompliant with glycemic control and smoking. She has plateaued and there is minimal improvement noted. We will transition to [facility], replaced the insert to her surgical shoe and she will follow-up in one week 05/10/18- She is here in follow up evaluation for a left great toe ulcer. It appears stable despite measurement change. We will continue with same treatment plan and follow up next week. 05/24/18-She is seen in follow-up evaluation for a left great toe ulcer. She remains compliant with offloading, has made significant improvement in her diet, decreasing the amount of sugar/soda. She said her recent A1c was 10.9 which is lower than. She did see a diabetic nutritionist/educator yesterday. She continues to smoke. We will continue with the same treatment plan and she'll follow-up next week. 05/31/18- She is seen in follow-up evaluation for left great toe ulcer. She continues to remain compliant with offloading, continues to make improvement in her diet, increasing her water and decreasing the amount of sugar/soda. She does continue to smoke with no desire to quit. We  will apply Prisma to the depth and Hydrofera Blue over. We have not received insurance authorization for oasis. She will follow up next week. 06/07/18-She is seen in follow-up evaluation for left great toe ulcer. It has stalled according to today's measurements although base appears stable. She says she saw a diabetic educator yesterday; her average blood sugars are less than 300 which is an improvement for her. She continues to smoke and states "that's my next step" She continues with water over soda. We will order for xray, culture and reinstate ace wrap compression prior to placing apligraf for next week. She is voicing no complaints or concerns. Her dressing will change to iodoflex over the next week in preparation for apligraf. 06/14/18-She is seen in follow-up evaluation for left great toe ulcer. Plain film x-ray performed last week was negative for osteomyelitis. Wound culture obtained last week grew strep B and OSSA; she is initiated on keflex and cefdinir today; there is erythema to the toe which could be from ace wrap compression, she has a history of wrapping too tight and has has been encouraged to maintain ace wraps that we place today. We will hold off on application of apligraf today, will apply next week after antibiotic therapy has been initiated. She admits today that she has resumed taking a shower with her foot/toe submerged in water, she has been reminded to keep foot/toe out of the bath water. She will be seen in follow up next week 06/21/18-she is seen in follow-up evaluation for left great toe ulcer. She is tolerating antibiotic therapy with no GI disturbance. The wound is stable. Apligraf was applied today. She has been decreasing her smoking, only had 4 cigarettes yesterday and [name], [name] ([id number]) 1 today. She continues being more compliant in diabetic diet. She will follow-up next week for evaluation of site, if stable will remove at 2 weeks. 06/28/18- She is here in  follow up evalution. Apligraf was placed last week, she states the dressing fell off on Tuesday and she was dressing with  hydrofera blue. She is healed and will be discharged from the clinic today. She has been instructed to continue with smoking cessation, continue monitoring glucose levels, offloading for an additional 4 weeks and continue with hydrofera blue for additional two weeks for any possible microscopic opening. Readmission: 08/07/18 on evaluation today patient presents for reevaluation concerning the ulcer of her right great toe. She was previously discharged on 06/28/18 healed. Nonetheless she states that this began to show signs of drainage she subsequently went to her primary care provider. Subsequently an x-ray was performed on 08/01/18 which was negative. The patient was also placed on antibiotics at that time. Fortunately they should have been effective for the infection. Nonetheless she's been experiencing some improvement but still has a lot of drainage coming from the wound itself. 08/14/18 on evaluation today patient's wound actually does show signs of improvement in regard to the erythema at this point. She has completed the antibiotics. With that being said we did discuss the possibility of placing her in a total contact cast as of today although I think that I may want to give this just a little bit more time to ensure nothing recurrence as far as her infection is concerned. I do not want to put in the cast and risk infection at that time if things are not completely resolved. With that being said she is gonna require some debridement today. 08/21/18 on evaluation today patient actually appears to be doing okay in regard to her toe ulcer. She's been tolerating the dressing changes without complication. With that being said it does appear that she is ready and in fact I think it's appropriate for [place] to go ahead and initiate the total contact cast today. Nonetheless she will  require some sharp debridement to prepare the wound for application. Overall I feel like things have been progressing well but we do need to do something to get this to close more readily. 08/24/18 patient seen today for reevaluation after having had the total contact cast applied on Tuesday. She seems to have done very well the wound appears to be doing great and overall I'm pleased with the progress that she's made. There were no abnormal areas of rubbing from the cast on her lower extremity. 08/30/18 on evaluation today patient actually appears to be completely healed in regard to her plantar toe ulcer. She tells me at this point she's been having a lot of issues with the cast. She almost fell a couple of times the state shall the step of her dog a couple times as well. This is been a very frustrating process for her other nonetheless she has completely healed the wound which is excellent news. Overall there does not appear to be the evidence of infection at this time which is great news. 09/11/18 evaluation today patient presents for follow-up concerning her great toe ulcer on the left which has unfortunately reopened since I last saw her which was only a couple of weeks ago. Unfortunately she was not able to get in to get the shoe and potentially the AFO that's gonna be necessary due to her left foot drop. She continues with offloading shoe but this is not enough to prevent her from reopening it appears. When we last had her in the total contact cast she did well from a healing standpoint but unfortunately the wound reopened as soon as she came out of the cast within just a couple of weeks. Right now the biggest concern is that I do  believe the foot drop is leading to the issue and this is gonna continue to be an issue unfortunately until we get things under control as far as the walking anomaly is concerned with the foot drop. This is also part of the reason why she falls on a regular basis. I  just do not believe that is gonna be safe for [place] to reinitiate the total contact cast as last time we had this on she fell 3 times one week which is definitely not normal for her. 09/18/18 upon evaluation today the patient actually appears to be doing about the same in regard to her toe ulcer. She did not contact [facility] as I asked her to even though I had given her the prescription. In fact she actually states that she has no idea where the prescription is. She did apparently call [facility] and they told her that all she needed to do was bring the prescription in order to be able to be seen and work on getting the AFO for her left foot. With all that being said she still does not have an appointment and I'm not sure were things stand that regard. I will give her a new prescription today in order to contact them to get this set up. 09/25/18 on evaluation today patient actually appears to be doing about the same in regard to her toes ulcer. She does have a small areas which seems to have a lot of callous buildup around the edge of the wound which is going to need sharp debridement today. She still is waiting to be scheduled for evaluation with [facility] for possibility of an AFO. She states there supposed to call her tomorrow to get this set up. Unfortunately it does appear that her foot specifically the toe area is showing signs of erythema. There does not appear to be any systemic infection which is in these good news. [name], [name] ([id number]) 10/02/18 on evaluation today patient actually appears to be doing about the same in regard to her toe ulcer. This really has not done too well although it's not significantly larger it's also not significantly smaller. She has been tolerating the dressing changes without complication. She actually has her appointment with [facility] and [facility] tomorrow to hopefully be measured for obtaining and AFO splint. I think this would be helpful preventing this from  reoccurring. We had contemplated starting the cast this week although to be honest I am reluctant to do that as she's been having nausea, vomiting, and seizure activity over the past three days. She has a history of seizures and have been told is nothing that can be done for these. With that being said I do believe that along with the seizures have the nausea vomiting which upon further questioning doesn't seem to be the normal for her and makes me concerned for the possibility of infection or something else going on. I discussed this with the patient and her mother during the office visit today. I do not feel the wound is effective but maybe something else. The responses this was "this just happens to her at times and we don't know why". They did not seem to be interested in going to the hospital to have this checked out further. 10/09/18 on evaluation today patient presents for follow-up concerning her ongoing toe ulcer. She has been tolerating the dressing changes without complication. Fortunately there does not appear to be any evidence of infection which is great news however I do think that the  patient would benefit from going ahead for with the total contact cast. She's actually in a wheelchair today she tells me that she will use her walker if we initiate the cast. I was very specific about the fact that if we were gonna do the cast I wanted to make sure that she was using the walker in order to prevent any falls. She tells me she does not have stairs that she has to traverse on a regular basis at her home. She has not had any seizures since last week again that something that happens to her often she tells me she did talk to [employer] and they said that it may take up to three weeks to get the brace approved for her. Hopefully that will not take that long but nonetheless in the meantime I do think the cast could be of benefit. 10/12/18 on evaluation today patient appears to be doing rather  well in regard to her toe ulcer. It's just been a few days and already this is significantly improved both as far as overall appearance and size. Fortunately there's no sign of infection. She is here for her first obligatory cast change. 10/19/18 Seen today for follow up and management of left great toe ulcer. Wound continues to show improvement. Noted small open area with seroussang drainage with palpation. Denies any increased pain or recent fevers during visit. She will continue calcium alginate with offloading shoe. Denies any questions or concerns during visit. 10/26/18 on evaluation today patient appears to be doing about the same as when I last saw her in regard to her wound bed. Fortunately there does not appear to be any signs of infection. Unfortunately she continues to have a breakdown in regard to the toe region any time that she is not in the cast. It takes almost no time at all for this to happen. Nonetheless she still has not heard anything from the brace being made by [employer] as to when exactly this will be available to her. Fortunately there is no signs of infection at this time. 10/30/18 on evaluation today patient presents for application of the total contact cast as we just received him this morning. Fortunately we are gonna be able to apply this to her today which is great news. She continues to have no significant pain which is good news. Overall I do feel like things have been improving while she was the cast is when she doesn't have a cast that things get worse. She still has not really heard anything from [facility] regarding her brace. 11/02/18 upon evaluation today patient's wound already appears to be doing significantly better which is good news. Fortunately there does not appear to be any signs of infection also good news. Overall I do think the total contact cast as before is helping to heal this area unfortunately it's just not gonna likely keep the area closed and healed  without her getting her brace at least. Again the foot drop is a significant issue for her. Patient History Information obtained from Patient. Social History Current every day smoker. Review of Systems (ROS) Constitutional Symptoms (General Health) Denies complaints or symptoms of Fever, Chills. Respiratory The patient has no complaints or symptoms. Cardiovascular The patient has no complaints or symptoms. BRANDY, ZUBA A. ([id number]) Psychiatric The patient has no complaints or symptoms. Objective Constitutional Well-nourished and well-hydrated in no acute distress. Vitals Time Taken: 11:02 AM, Height: 69 in, Weight: 240 lbs, BMI: 35.4, Temperature: 98.3 F, Pulse: 84 bpm, Respiratory Rate:  16 breaths/min, Blood Pressure: 105/67 mmHg. Respiratory normal breathing without difficulty. Psychiatric this patient is able to make decisions and demonstrates good insight into disease process. Alert and Oriented x 3. pleasant and cooperative. General Notes: Patient's wound bed today did require sharp debridement to remove necrotic material which he tolerated without, complication. With that being said following this the total contact cast was applied as before she tolerated this without any issues whatsoever. Integumentary (Hair, Skin) Wound #4R status is Open. Original cause of wound was Gradually Appeared. The wound is located on the Left Toe Great. The wound measures 0.5cm length x 0.2cm width x 0.5cm depth; 0.079cm^2 area and 0.039cm^3 volume. There is Fat Layer (Subcutaneous Tissue) Exposed exposed. There is no tunneling noted, however, there is undermining starting at 12:00 and ending at 12:00 with a maximum distance of 0.4cm. There is a medium amount of serous drainage noted. The wound margin is flat and intact. There is large (67-100%) red granulation within the wound bed. There is a small (1-33%) amount of necrotic tissue within the wound bed including Adherent Slough. The  periwound skin appearance exhibited: Scarring. The periwound skin appearance did not exhibit: Callus, Crepitus, Excoriation, Induration, Rash, Dry/Scaly, Maceration, Atrophie Blanche, Cyanosis, Ecchymosis, Hemosiderin Staining, Mottled, Pallor, Rubor, Erythema. Periwound temperature was noted as No Abnormality. Assessment Active Problems ICD-10 Type 2 diabetes mellitus with foot ulcer Non-pressure chronic ulcer of other part of left foot with fat layer exposed Foot drop, left foot [name], [name] ([id number]) Procedures Wound #4R Pre-procedure diagnosis of Wound #4R is a Diabetic Wound/Ulcer of the Lower Extremity located on the Left Toe Great .Severity of Tissue Pre Debridement is: Fat layer exposed. There was a Excisional Skin/Subcutaneous Tissue Debridement with a total area of 0.1 sq cm performed by STONE III, Kashawna Manzer E., PA-C. With the following instrument(s): Curette to remove Viable and Non-Viable tissue/material. Material removed includes Callus, Subcutaneous Tissue, and Slough after achieving pain control using Lidocaine 4% Topical Solution. No specimens were taken. A time out was conducted at 11:21, prior to the start of the procedure. A Minimum amount of bleeding was controlled with Pressure. The procedure was tolerated well with a pain level of 0 throughout and a pain level of 0 following the procedure. Post Debridement Measurements: 0.6cm length x 0.3cm width x 0.4cm depth; 0.057cm^3 volume. Character of Wound/Ulcer Post Debridement is improved. Severity of Tissue Post Debridement is: Fat layer exposed. Post procedure Diagnosis Wound #4R: Same as Pre-Procedure Pre-procedure diagnosis of Wound #4R is a Diabetic Wound/Ulcer of the Lower Extremity located on the Left Toe Great . There was a Total Contact Cast Procedure by STONE III, Celedonio Sortino E., PA-C. Post procedure Diagnosis Wound #4R: Same as Pre-Procedure Plan Wound Cleansing: Wound #4R Left Toe Great: Clean wound with Normal  Saline. Primary Wound Dressing: Wound #4R Left Toe Great: Silver Collagen Secondary Dressing: Wound #4R Left Toe Great: Foam Dressing Change Frequency: Wound #4R Left Toe Great: Change dressing every week Follow-up Appointments: Wound #4R Left Toe Great: Return Appointment in 1 week. Edema Control: Wound #4R Left Toe Great: Elevate legs to the level of the heart and pump ankles as often as possible Off-Loading: Wound #4R Left Toe Great: Total Contact Cast to Left Lower Extremity Other: - PLEASE CALL [employer] ABOUT YOUR AFO BRACE FOR YOUR FOOT We will subsequently see were things stand at follow-up. If anything changes or worsens patient will contact the office and let me know. Otherwise my hope is that she will continue to show signs  of good improvement with regard to healing under the cast but again this is not something that we can keep on her forever especially if she has no wound and everything heals. [name], [name] ([id number]) Please see above for specific wound care orders. We will see patient for re-evaluation in 1 week(s) here in the clinic. If anything worsens or changes patient will contact our office for additional recommendations. Electronic Signature(s) Signed: 11/11/2018 1:38:17 AM By: Worthy Keeler PA-C Entered By: Worthy Keeler on 11/11/2018 01:28:42 [name] ([id number]) -------------------------------------------------------------------------------- ROS/PFSH Details Patient Name: Heather Dear A. Date of Service: 11/02/2018 11:00 AM Medical Record Number: [id number] Patient Account Number: [id number] Date of Birth/Sex: 1976-02-03 (43 y.o. F) Treating RN: Montey Hora Primary Care Provider: Priscille Kluver Other Clinician: Referring Provider: Priscille Kluver Treating Provider/Extender: STONE III, Jheremy Boger Weeks in Treatment: 12 Information Obtained From Patient Wound History Do you currently have one or more open woundso Yes How many open wounds do you  currently haveo 1 Constitutional Symptoms (General Health) Complaints and Symptoms: Negative for: Fever; Chills Eyes Medical History: Positive for: Cataracts; Glaucoma; Optic Neuritis Ear/Nose/Mouth/Throat Medical History: Positive for: Chronic sinus problems/congestion; Middle ear problems Respiratory Complaints and Symptoms: No Complaints or Symptoms Cardiovascular Complaints and Symptoms: No Complaints or Symptoms Endocrine Medical History: Positive for: Type II Diabetes Time with diabetes: 10 years Treated with: Insulin, Oral agents Blood sugar tested every day: No Blood sugar testing results: Lunch: 350 Psychiatric Complaints and Symptoms: No Complaints or Symptoms HBO Extended History Items Mcmanigal, Ludia A. ([id number]) Eyes: Eyes: Ear/Nose/Mouth/Throat: Ear/Nose/Mouth/Throat: Cataracts Glaucoma Chronic sinus Middle ear problems problems/congestion Immunizations Pneumococcal Vaccine: Received Pneumococcal Vaccination: No Implantable Devices Family and Social History Current every day smoker Physician Affirmation I have reviewed and agree with the above information. Electronic Signature(s) Signed: 11/11/2018 1:38:17 AM By: Worthy Keeler PA-C Signed: 12/31/2018 10:45:26 AM By: Montey Hora Entered By: Worthy Keeler on 11/11/2018 01:27:20 [name], [name] Kitchen ([id number]) -------------------------------------------------------------------------------- Total Contact Cast Details Patient Name: Heather Dear A. Date of Service: 11/02/2018 11:00 AM Medical Record Number: [id number] Patient Account Number: [id number] Date of Birth/Sex: 05-16-1976 (43 y.o. F) Treating RN: Montey Hora Primary Care Provider: Priscille Kluver Other Clinician: Referring Provider: Priscille Kluver Treating Provider/Extender: STONE III, Taft Worthing Weeks in Treatment: 12 Total Contact Cast Applied for Wound Assessment: Wound #4R Left Toe Great Performed By: Physician Emilio Math., PA-C Post  Procedure Diagnosis Same as Pre-procedure Electronic Signature(s) Signed: 11/02/2018 3:29:00 PM By: Montey Hora Signed: 11/05/2018 7:30:10 AM By: Worthy Keeler PA-C Entered By: Montey Hora on 11/02/2018 11:28:31 Lorincz, Laurelin A. ([id number]) -------------------------------------------------------------------------------- SuperBill Details Patient Name: Heather Dear A. Date of Service: 11/02/2018 Medical Record Number: [id number] Patient Account Number: [id number] Date of Birth/Sex: 1976-01-11 (43 y.o. F) Treating RN: Montey Hora Primary Care Provider: Priscille Kluver Other Clinician: Referring Provider: Priscille Kluver Treating Provider/Extender: Melburn Hake, Ashayla Subia Weeks in Treatment: 12 Diagnosis Coding ICD-10 Codes Code Description E11.621 Type 2 diabetes mellitus with foot ulcer L97.522 Non-pressure chronic ulcer of other part of left foot with fat layer exposed M21.372 Foot drop, left foot Facility Procedures CPT4 Code: [id number] Description: 45625 - DEB SUBQ TISSUE 20 SQ CM/< ICD-10 Diagnosis Description L97.522 Non-pressure chronic ulcer of other part of left foot with fat Modifier: layer exposed Quantity: 1 Physician Procedures CPT4 Code: 6389373 Description: 11042 - WC PHYS SUBQ TISS 20 SQ CM ICD-10 Diagnosis Description L97.522 Non-pressure chronic ulcer of other part of left foot with fat Modifier: layer exposed Quantity: 1 Electronic  Signature(s) Signed: 11/05/2018 7:30:10 AM By: Worthy Keeler PA-C Entered By: Worthy Keeler on 11/05/2018 07:08:33

## 2018-11-09 ENCOUNTER — Encounter: Payer: Medicaid Other | Admitting: Physician Assistant

## 2018-11-09 DIAGNOSIS — E11621 Type 2 diabetes mellitus with foot ulcer: Secondary | ICD-10-CM | POA: Diagnosis not present

## 2018-11-15 DIAGNOSIS — E1165 Type 2 diabetes mellitus with hyperglycemia: Secondary | ICD-10-CM

## 2018-11-15 DIAGNOSIS — R0902 Hypoxemia: Secondary | ICD-10-CM | POA: Diagnosis not present

## 2018-11-15 DIAGNOSIS — R000 Tachycardia, unspecified: Secondary | ICD-10-CM

## 2018-11-15 DIAGNOSIS — I160 Hypertensive urgency: Secondary | ICD-10-CM

## 2018-11-15 DIAGNOSIS — R4182 Altered mental status, unspecified: Secondary | ICD-10-CM

## 2018-11-15 DIAGNOSIS — E876 Hypokalemia: Secondary | ICD-10-CM

## 2018-11-15 DIAGNOSIS — N179 Acute kidney failure, unspecified: Secondary | ICD-10-CM

## 2018-11-15 DIAGNOSIS — F1490 Cocaine use, unspecified, uncomplicated: Secondary | ICD-10-CM

## 2018-11-15 DIAGNOSIS — T43501A Poisoning by unspecified antipsychotics and neuroleptics, accidental (unintentional), initial encounter: Secondary | ICD-10-CM

## 2018-11-15 NOTE — Progress Notes (Signed)
Heather, [name] ([id number]) Visit Report for 11/09/2018 Arrival Information Details Patient Name: [name], [name] A. Date of Service: 11/09/2018 11:00 AM Medical Record Number: [id number] Patient Account Number: [id number] Date of Birth/Sex: 08-Nov-1975 (43 y.o. F) Treating RN: Secundino Ginger Primary Care Daylen Lipsky: Priscille Kluver Other Clinician: Referring Amr Sturtevant: Priscille Kluver Treating Kolbie Clarkston/Extender: Melburn Hake, HOYT Weeks in Treatment: 13 Visit Information History Since Last Visit Added or deleted any medications: No Patient Arrived: Wheel Chair Any new allergies or adverse reactions: No Arrival Time: 11:03 Had a fall or experienced change in No activities of daily living that may affect Accompanied By: daughter risk of falls: Transfer Assistance: None Signs or symptoms of abuse/neglect since last visito No Patient Identification Verified: Yes Hospitalized since last visit: No Secondary Verification Process Completed: Yes Implantable device outside of the clinic excluding No Patient Requires Transmission-Based No cellular tissue based products placed in the center Precautions: since last visit: Patient Has Alerts: No Has Dressing in Place as Prescribed: Yes Pain Present Now: No Notes pt has cast. Electronic Signature(s) Signed: 11/09/2018 11:50:24 AM By: Secundino Ginger Entered By: Secundino Ginger on 11/09/2018 11:05:19 [name] ([id number]) -------------------------------------------------------------------------------- Clinic Level of Care Assessment Details Patient Name: Heather Dear A. Date of Service: 11/09/2018 11:00 AM Medical Record Number: [id number] Patient Account Number: [id number] Date of Birth/Sex: [birth year] (43 y.o. F) Treating RN: Montey Hora Primary Care Alvar Malinoski: Priscille Kluver Other Clinician: Referring Kentavius Dettore: Priscille Kluver Treating Melford Tullier/Extender: Melburn Hake, HOYT Weeks in Treatment: 13 Clinic Level of Care Assessment Items TOOL 4 Quantity Score []  -  Use when only an EandM is performed on FOLLOW-UP visit 0 ASSESSMENTS - Nursing Assessment / Reassessment X - Reassessment of Co-morbidities (includes updates in patient status) 1 10 X- 1 5 Reassessment of Adherence to Treatment Plan ASSESSMENTS - Wound and Skin Assessment / Reassessment X - Simple Wound Assessment / Reassessment - one wound 1 5 []  - 0 Complex Wound Assessment / Reassessment - multiple wounds []  - 0 Dermatologic / Skin Assessment (not related to wound area) ASSESSMENTS - Focused Assessment []  - Circumferential Edema Measurements - multi extremities 0 []  - 0 Nutritional Assessment / Counseling / Intervention []  - 0 Lower Extremity Assessment (monofilament, tuning fork, pulses) []  - 0 Peripheral Arterial Disease Assessment (using hand held doppler) ASSESSMENTS - Ostomy and/or Continence Assessment and Care []  - Incontinence Assessment and Management 0 []  - 0 Ostomy Care Assessment and Management (repouching, etc.) PROCESS - Coordination of Care X - Simple Patient / Family Education for ongoing care 1 15 []  - 0 Complex (extensive) Patient / Family Education for ongoing care X- 1 10 Staff obtains Programmer, systems, Records, Test Results / Process Orders []  - 0 Staff telephones HHA, Nursing Homes / Clarify orders / etc []  - 0 Routine Transfer to another Facility (non-emergent condition) []  - 0 Routine Hospital Admission (non-emergent condition) []  - 0 New Admissions / Biomedical engineer / Ordering NPWT, Apligraf, etc. []  - 0 Emergency Hospital Admission (emergent condition) X- 1 10 Simple Discharge Coordination Demos, Icy A. ([id number]) []  - 0 Complex (extensive) Discharge Coordination PROCESS - Special Needs []  - Pediatric / Minor Patient Management 0 []  - 0 Isolation Patient Management []  - 0 Hearing / Language / Visual special needs []  - 0 Assessment of Community assistance (transportation, D/C planning, etc.) []  - 0 Additional assistance / Altered  mentation []  - 0 Support Surface(s) Assessment (bed, cushion, seat, etc.) INTERVENTIONS - Wound Cleansing / Measurement X - Simple Wound Cleansing - one wound 1 5 []  -  0 Complex Wound Cleansing - multiple wounds X- 1 5 Wound Imaging (photographs - any number of wounds) []  - 0 Wound Tracing (instead of photographs) X- 1 5 Simple Wound Measurement - one wound []  - 0 Complex Wound Measurement - multiple wounds INTERVENTIONS - Wound Dressings []  - Small Wound Dressing one or multiple wounds 0 []  - 0 Medium Wound Dressing one or multiple wounds []  - 0 Large Wound Dressing one or multiple wounds []  - 0 Application of Medications - topical []  - 0 Application of Medications - injection INTERVENTIONS - Miscellaneous []  - External ear exam 0 []  - 0 Specimen Collection (cultures, biopsies, blood, body fluids, etc.) []  - 0 Specimen(s) / Culture(s) sent or taken to Lab for analysis []  - 0 Patient Transfer (multiple staff / Civil Service fast streamer / Similar devices) []  - 0 Simple Staple / Suture removal (25 or less) []  - 0 Complex Staple / Suture removal (26 or more) []  - 0 Hypo / Hyperglycemic Management (close monitor of Blood Glucose) []  - 0 Ankle / Brachial Index (ABI) - do not check if billed separately X- 1 5 Vital Signs [name], Ashyah A. ([id number]) Has the patient been seen at the hospital within the last three years: Yes Total Score: 75 Level Of Care: New/Established - Level 2 Electronic Signature(s) Signed: 11/09/2018 5:32:05 PM By: Montey Hora Entered By: Montey Hora on 11/09/2018 11:32:21 [name] ([id number]) -------------------------------------------------------------------------------- Encounter Discharge Information Details Patient Name: Heather Dear A. Date of Service: 11/09/2018 11:00 AM Medical Record Number: [id number] Patient Account Number: [id number] Date of Birth/Sex: Aug 22, 1976 (43 y.o. F) Treating RN: Montey Hora Primary Care Marcayla Budge: Priscille Kluver Other Clinician: Referring Miaa Latterell: Priscille Kluver Treating Kaylianna Detert/Extender: Melburn Hake, HOYT Weeks in Treatment: 13 Encounter Discharge Information Items Discharge Condition: Stable Ambulatory Status: Wheelchair Discharge Destination: Home Transportation: Private Auto Accompanied By: mother Schedule Follow-up Appointment: No Clinical Summary of Care: Electronic Signature(s) Signed: 11/09/2018 5:32:05 PM By: Montey Hora Entered By: Montey Hora on 11/09/2018 11:33:03 [name] ([id number]) -------------------------------------------------------------------------------- Lower Extremity Assessment Details Patient Name: Heather Dear A. Date of Service: 11/09/2018 11:00 AM Medical Record Number: [id number] Patient Account Number: [id number] Date of Birth/Sex: [birth year] (43 y.o. F) Treating RN: Harold Barban Primary Care Massie Mees: Priscille Kluver Other Clinician: Referring Cuyler Vandyken: Priscille Kluver Treating Jasmen Emrich/Extender: Melburn Hake, HOYT Weeks in Treatment: 13 Electronic Signature(s) Signed: 11/12/2018 5:13:49 PM By: Harold Barban Entered By: Harold Barban on 11/09/2018 11:14:51 Struve, Shakeira AMarland Kitchen ([id number]) -------------------------------------------------------------------------------- Multi Wound Chart Details Patient Name: Heather Dear A. Date of Service: 11/09/2018 11:00 AM Medical Record Number: [id number] Patient Account Number: [id number] Date of Birth/Sex: 19-Aug-1976 (43 y.o. 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] Comorbid History: [4R:Cataracts, Glaucoma, Optic Neuritis, Chronic sinus problems/congestion, Middle ear problems, Type II Diabetes] [N/A:N/A] Date Acquired: [4R:07/24/2018] [N/A:N/A] Weeks of Treatment: [4R:13] [N/A:N/A] Wound Status: [4R:Healed - Epithelialized] [N/A:N/A] Wound Recurrence: [4R:Yes] [N/A:N/A] Pending Amputation on [4R:Yes] [N/A:N/A] Presentation: Measurements L x W x D [4R:0x0x0] [N/A:N/A] (cm) Area (cm) : [4R:0] [N/A:N/A] Volume (cm) : [4R:0] [N/A:N/A] % Reduction in Area: [4R:100.00%] [N/A:N/A] % Reduction in Volume: [4R:100.00%] [N/A:N/A] Classification: [4R:Grade 2] [N/A:N/A] Exudate Amount: [4R:None  Present] [N/A:N/A] Wound Margin: [4R:Flat and Intact] [N/A:N/A] Granulation Amount: [4R:Large (67-100%)] [N/A:N/A] Granulation Quality: [4R:Red] [N/A:N/A] Necrotic Amount: [4R:Small (1-33%)] [N/A:N/A] Exposed Structures: [4R:Fascia: No Fat Layer (Subcutaneous Tissue) Exposed: No Tendon: No Muscle: No Joint: No Bone: No] [N/A:N/A] Epithelialization: [4R:None] [N/A:N/A] Periwound Skin Texture: [4R:Scarring: Yes Excoriation: No] [N/A:N/A] Induration: No Callus: No Crepitus: No Rash: No Periwound Skin Moisture: Maceration: No N/A N/A Dry/Scaly: No Periwound Skin Color: Atrophie Blanche: No N/A N/A Cyanosis: No Ecchymosis: No Erythema: No Hemosiderin Staining: No Mottled: No Pallor: No Rubor: No Temperature: No Abnormality N/A N/A Tenderness on Palpation: No N/A N/A Wound Preparation: Ulcer Cleansing: N/A N/A Rinsed/Irrigated with Saline Treatment Notes Electronic Signature(s) Signed: 11/09/2018 5:32:05 PM By: Montey Hora Entered By: Montey Hora on 11/09/2018 11:30:13 [name] ([id number]) -------------------------------------------------------------------------------- [place] Details Patient Name: Heather Dear A. Date of Service:  11/09/2018 11:00 AM Medical Record Number: [id number] Patient Account Number: [id number] Date of Birth/Sex: Feb 25, 1976 (43 y.o. F) Treating RN: Montey Hora Primary Care Sufian Ravi: Priscille Kluver Other Clinician: Referring Yakelin Grenier: Priscille Kluver Treating Jamarrion Budai/Extender: Melburn Hake, HOYT Weeks in Treatment: 13 Active Inactive Abuse / Safety / Falls / Self Care Management Nursing Diagnoses: History of Falls Goals: Patient will not experience any injury related to falls Date Initiated: 08/07/2018 Target Resolution Date: 10/19/2018 Goal Status: Active Interventions: Assess fall risk on admission and as needed Notes: Orientation to the Wound Care Program Nursing Diagnoses: Knowledge deficit related to the wound healing center program Goals: Patient/caregiver will verbalize understanding of the [facility] Program Date Initiated: 08/07/2018 Target Resolution Date: 10/19/2018 Goal Status: Active Interventions: Provide education on orientation to the wound center Notes: Soft Tissue Infection Nursing Diagnoses: Impaired tissue integrity Goals: Patient will remain free of wound infection Date Initiated: 08/07/2018 Target Resolution Date: 10/19/2018 Goal Status: Active Interventions: Assess signs and symptoms of infection every visit [place]indale, Everlee A. ([id number]) Notes: Wound/Skin Impairment Nursing Diagnoses: Impaired tissue integrity Goals: Ulcer/skin breakdown will heal within 14 weeks Date Initiated: 08/07/2018 Target Resolution Date: 10/19/2018 Goal Status: Active Interventions: Assess patient/caregiver ability to obtain necessary supplies Notes: Electronic Signature(s) Signed: 11/09/2018 5:32:05 PM By: Montey Hora Entered By: Montey Hora on 11/09/2018 11:30:04 [name] ([id number]) -------------------------------------------------------------------------------- Pain Assessment Details Patient Name: Heather Dear A. Date of Service:  11/09/2018 11:00 AM Medical Record Number: [id number] Patient Account Number: [id number] Date of Birth/Sex: [birth year] (43 y.o. F) Treating RN: Secundino Ginger Primary Care Sheron Robin: Priscille Kluver Other Clinician: Referring Sorrel Cassetta: Priscille Kluver Treating Giana Castner/Extender: STONE III, HOYT Weeks in Treatment: 13 Active Problems Location of Pain Severity and Description of Pain Patient Has Paino No Site Locations Pain Management and Medication Current Pain Management: Notes pt denies any pain at this time. Electronic Signature(s) Signed: 11/09/2018 11:50:24 AM By: Secundino Ginger Entered By: Secundino Ginger on 11/09/2018 11:05:42 [name] ([id number]) -------------------------------------------------------------------------------- Patient/Caregiver Education Details Patient Name: Heather Dear A. Date of Service: 11/09/2018 11:00 AM Medical Record Number: [id number] Patient Account Number: [id number] Date of Birth/Gender: 1976/03/23 (43 y.o. F) Treating RN: Montey Hora Primary Care Physician: Priscille Kluver Other Clinician: Referring Physician: Priscille Kluver Treating Physician/Extender: Sharalyn Ink in Treatment: 13 Education Assessment Education Provided To: Patient and Caregiver Education Topics Provided Offloading: Handouts: Other: ways to offload toe Methods: Demonstration, Explain/Verbal Responses: State content correctly Electronic Signature(s) Signed: 11/09/2018 5:32:05 PM By: Montey Hora Entered By: Montey Hora on 11/09/2018 11:32:44 Remund, Tayonna AMarland Kitchen ([id number]) -------------------------------------------------------------------------------- Wound Assessment Details Patient Name: Heather Dear A. Date of Service: 11/09/2018 11:00 AM Medical Record  Number: [id number] Patient Account Number: [id number] Date of Birth/Sex: 03-09-1976 (43 y.o. F) Treating RN: Montey Hora Primary Care Jaydian Santana: Priscille Kluver Other Clinician: Referring Essie Gehret: Priscille Kluver Treating Tenelle Andreason/Extender: STONE III, HOYT Weeks in Treatment: 13 Wound Status Wound Number: [id number] Primary Diabetic Wound/Ulcer of the Lower Extremity Etiology: Wound Location: Left Toe Great Wound Healed - Epithelialized Wounding Event: Gradually Appeared Status: Date Acquired: 07/24/2018 Comorbid Cataracts, Glaucoma, Optic Neuritis, Chronic Weeks Of Treatment: 13 History: sinus problems/congestion, Middle ear problems, Clustered Wound: No Type II Diabetes Pending Amputation On Presentation Photos Photo Uploaded By: Harold Barban on 11/09/2018 17:12:27 Wound Measurements Length: (cm) 0 % Re Width: (cm) 0 % Re Depth: (cm) 0 Epit Area: (cm) 0 Tun Volume: (cm) 0 Und duction in Area: 100% duction in Volume: 100% helialization: None neling: No ermining: No Wound Description Classification: Grade 2 Wound Margin: Flat and Intact Exudate Amount: None Present Foul Odor After Cleansing: No Slough/Fibrino Yes Wound Bed Granulation Amount: Large (67-100%) Exposed Structure Granulation Quality: Red Fascia Exposed: No Necrotic Amount: Small (1-33%) Fat Layer (Subcutaneous Tissue) Exposed: No Necrotic Quality: Adherent Slough Tendon Exposed: No Muscle Exposed: No Joint Exposed: No Bone Exposed: No Periwound Skin Texture Texture Color Matthews, Annamarie A. ([id number]) No Abnormalities Noted: No No Abnormalities Noted: No Callus: No Atrophie Blanche: No Crepitus: No Cyanosis: No Excoriation: No Ecchymosis: No Induration: No Erythema: No Rash: No Hemosiderin Staining: No Scarring: Yes Mottled: No Pallor: No Moisture Rubor: No No Abnormalities Noted: No Dry / Scaly: No Temperature / Pain Maceration: No Temperature: No Abnormality Wound Preparation Ulcer Cleansing: Rinsed/Irrigated with Saline Electronic Signature(s) Signed: 11/09/2018 5:32:05 PM By: Montey Hora Entered By: Montey Hora on 11/09/2018 11:22:52 [name]  ([id number]) -------------------------------------------------------------------------------- Vitals Details Patient Name: Heather Dear A. Date of Service: 11/09/2018 11:00 AM Medical Record Number: [id number] Patient Account Number: [id number] Date of Birth/Sex: 1976-09-23 (43 y.o. F) Treating RN: Harold Barban Primary Care Loris Winrow: Priscille Kluver Other Clinician: Referring Vandy Fong: Priscille Kluver Treating Adain Geurin/Extender: STONE III, HOYT Weeks in Treatment: 13 Vital Signs Time Taken: 11:17 Temperature (F): 98.1 Height (in): 69 Pulse (bpm): 91 Weight (lbs): 240 Respiratory Rate (breaths/min): 16 Body Mass Index (BMI): 35.4 Blood Pressure (mmHg): 107/78 Reference Range: 80 - 120 mg / dl Electronic Signature(s) Signed: 11/12/2018 5:13:49 PM By: Harold Barban Entered By: Harold Barban on 11/09/2018 11:22:22

## 2018-11-16 ENCOUNTER — Encounter: Payer: Medicaid Other | Admitting: Physician Assistant

## 2018-11-16 NOTE — Progress Notes (Signed)
SISTER, CARBONE ([id number]) Visit Report for 11/09/2018 Chief Complaint Document Details Patient Name: [name], [name] Date of Service: 11/09/2018 11:00 AM Medical Record Number: [id number] Patient Account Number: [id number] Date of Birth/Sex: [birth year] (43 y.o. F) Treating RN: Montey Hora Primary Care Provider: Priscille Kluver Other Clinician: Referring Provider: Priscille Kluver Treating Provider/Extender: Melburn Hake, HOYT Weeks in Treatment: 13 Information Obtained from: Patient Chief Complaint Left great toe ulcer Electronic Signature(s) Signed: 11/15/2018 9:23:56 AM By: Worthy Keeler PA-C Entered By: Worthy Keeler on 11/09/2018 11:13:15 Daza, Katasha [name]. ([id number]) -------------------------------------------------------------------------------- HPI Details Patient Name: [name] Date of Service: 11/09/2018 11:00 AM Medical Record Number: [id number] Patient Account Number: [id number] Date of Birth/Sex: 09-Aug-1976 (43 y.o. F) Treating RN: Montey Hora Primary Care Provider: Priscille Kluver Other Clinician: Referring Provider: Priscille Kluver Treating Provider/Extender: Melburn Hake, HOYT Weeks in Treatment: 13 History of Present Illness HPI Description: 01/18/18-She is here for initial evaluation of the left great toe ulcer. She is [name] poor historian in regards to timeframe in detail. She states approximately 4 weeks ago she lacerated her toe on something in the house. She followed up with her primary care who placed her on Bactrim and ultimately [name] second dose of Bactrim prior to coming to wound clinic. She states she has been treating the toe with peroxide, Betadine and [name] Band-Aid. She did not check her blood sugar this morning but checked it yesterday morning it was 327; she is unaware of [name] recent A1c and there are no current records. She saw Dr. she would've orthopedics last week for an old injury to the left ankle, she states he did not see her toe, nor did she bring it to his  attention. She smokes approximately 1 pack cigarettes [name] day. Her social situation is concerning, she arrives this morning with her mother who appears extremely intoxicated/under the influence; her mother was asked to leave the room and be monitored by the patient's grandmother. The patient's aunt then accompanied the patient and the room throughout the rest of the appointment. We had [name] lengthy discussion regarding the deleterious effects of uncontrolled hyperglycemia and smoking as it relates to wound healing and overall health. She was strongly encouraged to decrease her smoking and get her diabetes under better control. She states she is currently on [name] diet and has cut down her [facility] consumption. The left toe is erythematous, macerated and slightly edematous with malodor present. The edema in her left foot is below her baseline, there is no erythema streaking. We will treat her with Santyl, doxycycline; we have ordered and xray, culture and provided [name] Peg assist surgical shoe and cultured the wound. 01/25/18-She is here in follow-up evaluation for [name] left great toe ulcer and presents with an abscess to her suprapubic area. She states her blood sugars remain elevated, feeling "sick" and if levels are below 250, but she is trying. She has made no attempt to decrease her smoking stating that we "can't take away her food in her cigarettes". She has been compliant with offloading using the PEG assist you. She is using Santyl daily. the culture obtained last week grew staph aureus and Enterococcus faecalis; continues on the doxycycline and Augmentin was added on Monday. The suprapubic area has erythema, no femoral variation, purple discoloration, minimal induration, was accessed with [name] cotton tip applicator with sanguinopurulent drainage, this was cultured, I suspect the current antibiotic treatment will cover and we will not add anything to her current treatment plan. She was advised  to go to urgent  care or ER with any change in redness, induration or fever. 02/01/18-She is here in follow-up evaluation for left great toe ulcers and [name] new abdominal abscess from last week. She was able to use packing until earlier this week, where she "forgot it was there". She states she was feeling ill with GI symptoms last week and was not taking her antibiotic. She states her glucose levels have been predominantly less than 200, with occasional levels between 200-250. She thinks this was contributing to her GI symptoms as they have resolved without intervention. There continues to be significant laceration to left toe, otherwise it clinically looks stable/improved. There is now less superficial opening to the lateral aspect of the great toe that was residual blister. We will transition to [facility] to all wounds, she will continue her Augmentin. If there is no change or deterioration next week for reculture. 02/08/18-She is here in follow-up evaluation for left great toe ulcer and abdominal ulcer. There is an improvement in both wounds. She has been wrapping her left toe with coban, not by our direction, which has created an area of discoloration to the medial aspect; she has been advised to NOT use coban secondary to her neuropathy. She states her glucose levels have been high over this last week ranging from 200-350, she continues to smoke. She admits to being less compliant with her offloading shoe. We will continue with same treatment plan and she will follow-up next week. 02/15/18-She is here in follow-up evaluation for left great toe ulcer and abdominal ulcer. The abdominal ulcer is epithelialized. The left great toe ulcer is improved and all injury from last week using the Coban wrap is resolved, the lateral ulcer is healed. She admits to noncompliance with wearing offloading shoe and admits to glucose levels being greater than 300 most of the week. She continues to smoke and expresses no desire to  quit. There is one area medially that probes deeper than it has historically, erythema to the toe and dorsal foot has consistently waxed and waned. There is no overt signs of cellulitis or infection but we will culture the wound for any occult infection given the new area of depth and erythema. We will hold off on sensitivities for initiation of antibiotic therapy. 02/22/18-She is here in follow up evaluation for left great toe ulcer. There is overall significant improvement in both wound appearance, erythema and edema with changes made last week. She was not initiated on antibiotic therapy. Culture obtained last week showed oxacillin sensitive staph aureus, sensitive to clindamycin. Clindamycin has been called into the pharmacy but she has been instructed to hold off on initiation secondary to overall clinical improvement and her history of antibiotic intolerance. She has been instructed to contact the clinic with any noted changes/deterioration and the wound, erythema, Mogensen, Seletha [name]. ([id number]) edema and/or pain. She will follow-up next week. She continues to smoke and her glucose levels remain elevated >250; she admits to compliance with offloading shoe 03/01/18 on evaluation today patient appears to be doing fairly well in regard to her left first toe ulcer. She has been tolerating the dressing changes with the [facility] Dressing without complication and overall this has definitely showed signs of improvement according to records as well is what the patient tells me today. I'm very pleased in that regard. She is having no pain today 03/08/18 She is here for follow up evaluation of [name] left great toe ulcer. She remains non-compliant with glucose control  and smoking cessation; glucose levels consistently >200. She states that she got new shoe inserts/peg assist. She admits to compliance with offloading. Since my last evaluation there is significant improvement. We will switch to prisma at this  time and she will follow up next week. She is noted to be tachycardic at this appointment, heart rate 120s; she has [name] history of heart rate 70-130 according to our records. She admits to extreme agitation r/t personal issues; she was advised to monitor her heartrate and contact her physician if it does not return to [name] more normal range (<100). She takes cardizem twice daily. 03/15/18-She is here in follow-up evaluation for left great toe ulcer. She remains noncompliant with glucose control and smoking cessation. She admits to compliance with wearing offloading shoe. The ulcer is improved/stable and we will continue with the same treatment plan and she will follow-up next week 03/22/18-She is here for evaluation for left great toe ulcer. There continues to be significant improvement despite recurrent hyperglycemia (over 500 yesterday) and she continues to smoke. She has been compliant with offloading and we will continue with same treatment plan and she will follow-up next week. 03/29/18-She is here for evaluation for left great toe ulcer. Despite continuing to smoke and uncontrolled diabetes she continues to improve. She is compliant with offloading shoe. We will continue with the same treatment plan and she will follow-up next week 04/05/18- She is here in follow up evaluation for [name] left great toe ulcer; she presents with small pustule to left fifth toe (resembles ant bite). She admits to compliance with wearing offloading shoe; continues to smoke or have uncontrolled blood glucose control. There is more callus than usual with evidence of bleeding; she denies known trauma. 04/12/18-She is here for evaluation of left great toe ulcer. Despite noncompliance with glycemic control and smoking she continues to make improvement. She continues to wear offloading shoe. The pustule, that was identified last week, to the left fifth toe is resolved. She will follow-up in 2 weeks 05/03/18-she is seen in follow-up  evaluation for [name] left great toe ulcer. She is compliant with offloading, otherwise noncompliant with glycemic control and smoking. She has plateaued and there is minimal improvement noted. We will transition to [facility], replaced the insert to her surgical shoe and she will follow-up in one week 05/10/18- She is here in follow up evaluation for [name] left great toe ulcer. It appears stable despite measurement change. We will continue with same treatment plan and follow up next week. 05/24/18-She is seen in follow-up evaluation for [name] left great toe ulcer. She remains compliant with offloading, has made significant improvement in her diet, decreasing the amount of sugar/soda. She said her recent A1c was 10.9 which is lower than. She did see [name] diabetic nutritionist/educator yesterday. She continues to smoke. We will continue with the same treatment plan and she'll follow-up next week. 05/31/18- She is seen in follow-up evaluation for left great toe ulcer. She continues to remain compliant with offloading, continues to make improvement in her diet, increasing her water and decreasing the amount of sugar/soda. She does continue to smoke with no desire to quit. We will apply Prisma to the depth and Hydrofera Blue over. We have not received insurance authorization for oasis. She will follow up next week. 06/07/18-She is seen in follow-up evaluation for left great toe ulcer. It has stalled according to today's measurements although base appears stable. She says she saw [name] diabetic educator yesterday; her average blood sugars are  less than 300 which is an improvement for her. She continues to smoke and states "that's my next step" She continues with water over soda. We will order for xray, culture and reinstate ace wrap compression prior to placing apligraf for next week. She is voicing no complaints or concerns. Her dressing will change to iodoflex over the next week in preparation for apligraf. 06/14/18-She is  seen in follow-up evaluation for left great toe ulcer. Plain film x-ray performed last week was negative for osteomyelitis. Wound culture obtained last week grew strep B and OSSA; she is initiated on keflex and cefdinir today; there is erythema to the toe which could be from ace wrap compression, she has [name] history of wrapping too tight and has has been encouraged to maintain ace wraps that we place today. We will hold off on application of apligraf today, will apply next week after antibiotic therapy has been initiated. She admits today that she has resumed taking [name] shower with her foot/toe submerged in water, she has been reminded to keep foot/toe out of the bath water. She will be seen in follow up next week 06/21/18-she is seen in follow-up evaluation for left great toe ulcer. She is tolerating antibiotic therapy with no GI disturbance. The wound is stable. Apligraf was applied today. She has been decreasing her smoking, only had 4 cigarettes yesterday and 1 today. She continues being more compliant in diabetic diet. She will follow-up next week for evaluation of site, if stable will remove at 2 weeks. 06/28/18- She is here in follow up evalution. Apligraf was placed last week, she states the dressing fell off on Tuesday and she was dressing with hydrofera blue. She is healed and will be discharged from the clinic today. She has been instructed to continue with smoking cessation, continue monitoring glucose levels, offloading for an additional 4 weeks and continue with Pree, [name] [name]. ([id number]) hydrofera blue for additional two weeks for any possible microscopic opening. Readmission: 08/07/18 on evaluation today patient presents for reevaluation concerning the ulcer of her right great toe. She was previously discharged on 06/28/18 healed. Nonetheless she states that this began to show signs of drainage she subsequently went to her primary care provider. Subsequently an x-ray was performed on  08/01/18 which was negative. The patient was also placed on antibiotics at that time. Fortunately they should have been effective for the infection. Nonetheless she's been experiencing some improvement but still has [name] lot of drainage coming from the wound itself. 08/14/18 on evaluation today patient's wound actually does show signs of improvement in regard to the erythema at this point. She has completed the antibiotics. With that being said we did discuss the possibility of placing her in [name] total contact cast as of today although I think that I may want to give this just [name] little bit more time to ensure nothing recurrence as far as her infection is concerned. I do not want to put in the cast and risk infection at that time if things are not completely resolved. With that being said she is gonna require some debridement today. 08/21/18 on evaluation today patient actually appears to be doing okay in regard to her toe ulcer. She's been tolerating the dressing changes without complication. With that being said it does appear that she is ready and in fact I think it's appropriate for [place] to go ahead and initiate the total contact cast today. Nonetheless she will require some sharp debridement to prepare the wound for application. Overall  I feel like things have been progressing well but we do need to do something to get this to close more readily. 08/24/18 patient seen today for reevaluation after having had the total contact cast applied on Tuesday. She seems to have done very well the wound appears to be doing great and overall I'm pleased with the progress that she's made. There were no abnormal areas of rubbing from the cast on her lower extremity. 08/30/18 on evaluation today patient actually appears to be completely healed in regard to her plantar toe ulcer. She tells me at this point she's been having [name] lot of issues with the cast. She almost fell [name] couple of times the state shall the step of her  dog [name] couple times as well. This is been [name] very frustrating process for her other nonetheless she has completely healed the wound which is excellent news. Overall there does not appear to be the evidence of infection at this time which is great news. 09/11/18 evaluation today patient presents for follow-up concerning her great toe ulcer on the left which has unfortunately reopened since I last saw her which was only [name] couple of weeks ago. Unfortunately she was not able to get in to get the shoe and potentially the AFO that's gonna be necessary due to her left foot drop. She continues with offloading shoe but this is not enough to prevent her from reopening it appears. When we last had her in the total contact cast she did well from [name] healing standpoint but unfortunately the wound reopened as soon as she came out of the cast within just [name] couple of weeks. 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 This is also part of the reason why she falls on [name] regular basis. I just do not believe that is gonna be safe for [place] to reinitiate the total contact cast as last time we had this on she fell 3 times one week which is definitely not normal for her. 09/18/18 upon evaluation today the patient actually appears to be doing about the same in regard to her toe ulcer. She did not contact [employer] as I asked her to even though I had given her the prescription. In fact she actually states that she has no idea where the prescription is. She did apparently call [employer] and they told her that all she needed to do was bring the prescription in order to be able to be seen and work on getting the AFO for her left foot. With all that being said she still does not have an appointment and I'm not sure were things stand that regard.  I will give her [name] new prescription today in order to contact them to get this set up. 09/25/18 on evaluation today patient actually appears to be doing about the same in regard to her toes ulcer. She does have [name] small areas which seems to have [name] lot of callous buildup around the edge of the wound which is going to need sharp debridement today. She still is waiting to be scheduled for evaluation with [facility] for possibility of an AFO. She states there supposed to call her tomorrow to get this set up. Unfortunately it does appear that her foot specifically the toe area is showing signs  of erythema. There does not appear to be any systemic infection which is in these good news. 10/02/18 on evaluation today patient actually appears to be doing about the same in regard to her toe ulcer. This really has not done too well although it's not significantly larger it's also not significantly smaller. She has been tolerating the dressing changes without complication. She actually has her appointment with [facility] and [place] tomorrow to hopefully be measured for obtaining and AFO splint. I think this would be helpful preventing this from reoccurring. We had contemplated starting the cast this week although to be honest I am reluctant to do that as she's been having nausea, vomiting, and seizure activity over the past three days. She has [name] history of seizures and have been told is nothing that can be done for these. With Hodkinson, Money [name]. ([id number]) that being said I do believe that along with the seizures have the nausea vomiting which upon further questioning doesn't seem to be the normal for her and makes me concerned for the possibility of infection or something else going on. I discussed this with the patient and her mother during the office visit today. I do not feel the wound is effective but maybe something else. The responses this was "this just happens to her at times and we don't know why". They did  not seem to be interested in going to the hospital to have this checked out further. 10/09/18 on evaluation today patient presents for follow-up concerning her ongoing toe ulcer. She has been tolerating the dressing changes without complication. Fortunately there does not appear to be any evidence of infection which is great news however I do think that the patient would benefit from going ahead for with the total contact cast. She's actually in [name] wheelchair today she tells me that she will use her walker if we initiate the cast. I was very specific about the fact that if we were gonna do the cast I wanted to make sure that she was using the walker in order to prevent any falls. She tells me she does not have stairs that she has to traverse on [name] regular basis at her home. She has not had any seizures since last week again that something that happens to her often she tells me she did talk to [employer] and they said that it may take up to three weeks to get the brace approved for her. Hopefully that will not take that long but nonetheless in the meantime I do think the cast could be of benefit. 10/12/18 on evaluation today patient appears to be doing rather well in regard to her toe ulcer. It's just been [name] few days and already this is significantly improved both as far as overall appearance and size. Fortunately there's no sign of infection. She is here for her first obligatory cast change. 10/19/18 Seen today for follow up and management of left great toe ulcer. Wound continues to show improvement. Noted small open area with seroussang drainage with palpation. Denies any increased pain or recent fevers during visit. She will continue calcium alginate with offloading shoe. Denies any questions or concerns during visit. 10/26/18 on evaluation today patient appears to be doing about the same as when I last saw her in regard to her wound bed. Fortunately there does not appear to be any signs of infection.  Unfortunately she continues to have [name] breakdown in regard to the toe region any time that she is not in the cast.  It takes almost no time at all for this to happen. Nonetheless she still has not heard anything from the brace being made by [employer] as to when exactly this will be available to her. Fortunately there is no signs of infection at this time. 10/30/18 on evaluation today patient presents for application of the total contact cast as we just received him this morning. Fortunately we are gonna be able to apply this to her today which is great news. She continues to have no significant pain which is good news. Overall I do feel like things have been improving while she was the cast is when she doesn't have [name] cast that things get worse. She still has not really heard anything from [place] regarding her brace. 11/02/18 upon evaluation today patient's wound already appears to be doing significantly better which is good news. Fortunately there does not appear to be any signs of infection also good news. Overall I do think the total contact cast as before is helping to heal this area unfortunately it's just not gonna likely keep the area closed and healed without her getting her brace at least. Again the foot drop is [name] significant issue for her. 11/09/18 on evaluation today patient appears to be doing excellent in regard to her toe ulcer which in fact is completely healed. Fortunately we finally got the situation squared away with the paperwork which was needed to proceed with getting her brace approved by Medicaid. I have filled that out unfortunately that information has been sent to the orthopedic office that I worked at 2 1/2 years ago and not tired Current wound care measures. Fortunately she seems to be doing very well at this time. Electronic Signature(s) Signed: 11/15/2018 9:23:56 AM By: Worthy Keeler PA-C Entered By: Worthy Keeler on 11/15/2018 09:04:31 [name]  ([id number]) -------------------------------------------------------------------------------- Physical Exam Details Patient Name: [name] Date of Service: 11/09/2018 11:00 AM Medical Record Number: [id number] Patient Account Number: [id number] Date of Birth/Sex: 1976-06-29 (43 y.o. F) Treating RN: Montey Hora Primary Care Provider: Priscille Kluver Other Clinician: Referring Provider: Priscille Kluver Treating Provider/Extender: STONE III, HOYT Weeks in Treatment: 87 Constitutional Well-nourished and well-hydrated in no acute distress. Respiratory normal breathing without difficulty. clear to auscultation bilaterally. Cardiovascular regular rate and rhythm with normal S1, S2. Psychiatric this patient is able to make decisions and demonstrates good insight into disease process. Alert and Oriented x 3. pleasant and cooperative. Notes Obviously patient required no debridement at this point the wound shows complete epithelialization. Electronic Signature(s) Signed: 11/15/2018 9:23:56 AM By: Worthy Keeler PA-C Entered By: Worthy Keeler on 11/15/2018 09:04:53 [name] ([id number]) -------------------------------------------------------------------------------- Physician Orders Details Patient Name: [name] Date of Service: 11/09/2018 11:00 AM Medical Record Number: [id number] Patient Account Number: [id number] Date of Birth/Sex: [birth year] (43 y.o. 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 ([id number]) -------------------------------------------------------------------------------- Problem List Details Patient Name: MANZER, Kesha [name]. Date of Service: 11/09/2018 11:00 AM Medical Record Number: [id number] Patient Account Number: [id number] Date of Birth/Sex: 09/04/1976 (43 y.o. 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 ([id number]) -------------------------------------------------------------------------------- Progress Note Details Patient Name: [name] Date of Service: 11/09/2018 11:00 AM Medical Record Number: [id number] Patient Account Number: [id number] Date of Birth/Sex: [birth year] (43 y.o. F) Treating RN: Montey Hora Primary Care Provider: Priscille Kluver Other Clinician: Referring Provider: Priscille Kluver Treating Provider/Extender: Melburn Hake, HOYT Weeks in Treatment: 13 Subjective Chief Complaint Information obtained from Patient Left great toe ulcer History of Present Illness  (HPI) 01/18/18-She is here for initial evaluation of the left great toe ulcer. She is [name] poor historian in regards to timeframe in detail. She states approximately 4 weeks ago she lacerated her toe on something in the house. She followed up with her primary care who placed her on Bactrim and ultimately [name] second dose of Bactrim prior to coming to wound clinic. She states she has been treating the toe with peroxide, Betadine and [name] Band-Aid. She did not check her blood sugar this morning but checked it yesterday morning it was 327; she is unaware of [name] recent A1c and there are no current records. She saw Dr. she would've orthopedics last week for an old injury to the left ankle, she states he did not see her toe, nor did she bring it to his attention. She smokes approximately 1 pack cigarettes [name] day. Her social situation is concerning, she arrives this morning with her mother who appears extremely intoxicated/under the influence; her mother was asked to leave the room and be monitored by the patient's grandmother. The patient's aunt then accompanied the patient and the room throughout the rest of the appointment. We had [name] lengthy discussion regarding the deleterious effects of uncontrolled hyperglycemia and smoking as it relates to wound healing and overall health. She was strongly encouraged to decrease her smoking and get her diabetes under better control. She states she is currently on [name] diet and has cut down her [facility] consumption. The left toe is erythematous, macerated and slightly edematous with malodor present. The edema in her left foot is below her baseline, there is no erythema streaking. We will treat her with Santyl, doxycycline; we have ordered and xray, culture and provided [name] Peg assist surgical shoe and cultured the wound. 01/25/18-She is here in follow-up evaluation for [name] left great toe ulcer and presents with an abscess to her suprapubic area. She states her blood sugars remain  elevated, feeling "sick" and if levels are below 250, but she is trying. She has made no attempt to decrease her smoking stating that we "can't take away her food in her cigarettes". She has been compliant with offloading using the PEG assist you. She is using Santyl daily. the culture obtained last week  grew staph aureus and Enterococcus faecalis; continues on the doxycycline and Augmentin was added on Monday. The suprapubic area has erythema, no femoral variation, purple discoloration, minimal induration, was accessed with [name] cotton tip applicator with sanguinopurulent drainage, this was cultured, I suspect the current antibiotic treatment will cover and we will not add anything to her current treatment plan. She was advised to go to urgent care or ER with any change in redness, induration or fever. 02/01/18-She is here in follow-up evaluation for left great toe ulcers and [name] new abdominal abscess from last week. She was able to use packing until earlier this week, where she "forgot it was there". She states she was feeling ill with GI symptoms last week and was not taking her antibiotic. She states her glucose levels have been predominantly less than 200, with occasional levels between 200-250. She thinks this was contributing to her GI symptoms as they have resolved without intervention. There continues to be significant laceration to left toe, otherwise it clinically looks stable/improved. There is now less superficial opening to the lateral aspect of the great toe that was residual blister. We will transition to [facility] to all wounds, she will continue her Augmentin. If there is no change or deterioration next week for reculture. 02/08/18-She is here in follow-up evaluation for left great toe ulcer and abdominal ulcer. There is an improvement in both wounds. She has been wrapping her left toe with coban, not by our direction, which has created an area of discoloration to the medial aspect;  she has been advised to NOT use coban secondary to her neuropathy. She states her glucose levels have been high over this last week ranging from 200-350, she continues to smoke. She admits to being less compliant with her offloading shoe. We will continue with same treatment plan and she will follow-up next week. 02/15/18-She is here in follow-up evaluation for left great toe ulcer and abdominal ulcer. The abdominal ulcer is epithelialized. The left great toe ulcer is improved and all injury from last week using the Coban wrap is resolved, the lateral ulcer is healed. She admits to noncompliance with wearing offloading shoe and admits to glucose levels being greater than 300 most of the week. She continues to smoke and expresses no desire to quit. There is one area medially that probes deeper than it has historically, erythema to the toe and dorsal foot has consistently waxed and waned. There is no overt signs of cellulitis or infection but we will culture the wound for any occult infection given the new area of depth and erythema. We will hold off on Fager, Debroh [name]. ([id number]) sensitivities for initiation of antibiotic therapy. 02/22/18-She is here in follow up evaluation for left great toe ulcer. There is overall significant improvement in both wound appearance, erythema and edema with changes made last week. She was not initiated on antibiotic therapy. Culture obtained last week showed oxacillin sensitive staph aureus, sensitive to clindamycin. Clindamycin has been called into the pharmacy but she has been instructed to hold off on initiation secondary to overall clinical improvement and her history of antibiotic intolerance. She has been instructed to contact the clinic with any noted changes/deterioration and the wound, erythema, edema and/or pain. She will follow-up next week. She continues to smoke and her glucose levels remain elevated >250; she admits to compliance with offloading  shoe 03/01/18 on evaluation today patient appears to be doing fairly well in regard to her left first toe ulcer. She has been tolerating  the dressing changes with the [facility] Dressing without complication and overall this has definitely showed signs of improvement according to records as well is what the patient tells me today. I'm very pleased in that regard. She is having no pain today 03/08/18 She is here for follow up evaluation of [name] left great toe ulcer. She remains non-compliant with glucose control and smoking cessation; glucose levels consistently >200. She states that she got new shoe inserts/peg assist. She admits to compliance with offloading. Since my last evaluation there is significant improvement. We will switch to prisma at this time and she will follow up next week. She is noted to be tachycardic at this appointment, heart rate 120s; she has [name] history of heart rate 70-130 according to our records. She admits to extreme agitation r/t personal issues; she was advised to monitor her heartrate and contact her physician if it does not return to [name] more normal range (<100). She takes cardizem twice daily. 03/15/18-She is here in follow-up evaluation for left great toe ulcer. She remains noncompliant with glucose control and smoking cessation. She admits to compliance with wearing offloading shoe. The ulcer is improved/stable and we will continue with the same treatment plan and she will follow-up next week 03/22/18-She is here for evaluation for left great toe ulcer. There continues to be significant improvement despite recurrent hyperglycemia (over 500 yesterday) and she continues to smoke. She has been compliant with offloading and we will continue with same treatment plan and she will follow-up next week. 03/29/18-She is here for evaluation for left great toe ulcer. Despite continuing to smoke and uncontrolled diabetes she continues to improve. She is compliant with offloading shoe. We  will continue with the same treatment plan and she will follow-up next week 04/05/18- She is here in follow up evaluation for [name] left great toe ulcer; she presents with small pustule to left fifth toe (resembles ant bite). She admits to compliance with wearing offloading shoe; continues to smoke or have uncontrolled blood glucose control. There is more callus than usual with evidence of bleeding; she denies known trauma. 04/12/18-She is here for evaluation of left great toe ulcer. Despite noncompliance with glycemic control and smoking she continues to make improvement. She continues to wear offloading shoe. The pustule, that was identified last week, to the left fifth toe is resolved. She will follow-up in 2 weeks 05/03/18-she is seen in follow-up evaluation for [name] left great toe ulcer. She is compliant with offloading, otherwise noncompliant with glycemic control and smoking. She has plateaued and there is minimal improvement noted. We will transition to [facility], replaced the insert to her surgical shoe and she will follow-up in one week 05/10/18- She is here in follow up evaluation for [name] left great toe ulcer. It appears stable despite measurement change. We will continue with same treatment plan and follow up next week. 05/24/18-She is seen in follow-up evaluation for [name] left great toe ulcer. She remains compliant with offloading, has made significant improvement in her diet, decreasing the amount of sugar/soda. She said her recent A1c was 10.9 which is lower than. She did see [name] diabetic nutritionist/educator yesterday. She continues to smoke. We will continue with the same treatment plan and she'll follow-up next week. 05/31/18- She is seen in follow-up evaluation for left great toe ulcer. She continues to remain compliant with offloading, continues to make improvement in her diet, increasing her water and decreasing the amount of sugar/soda. She does continue to smoke with no desire  to quit. We  will apply Prisma to the depth and Hydrofera Blue over. We have not received insurance authorization for oasis. She will follow up next week. 06/07/18-She is seen in follow-up evaluation for left great toe ulcer. It has stalled according to today's measurements although base appears stable. She says she saw [name] diabetic educator yesterday; her average blood sugars are less than 300 which is an improvement for her. She continues to smoke and states "that's my next step" She continues with water over soda. We will order for xray, culture and reinstate ace wrap compression prior to placing apligraf for next week. She is voicing no complaints or concerns. Her dressing will change to iodoflex over the next week in preparation for apligraf. 06/14/18-She is seen in follow-up evaluation for left great toe ulcer. Plain film x-ray performed last week was negative for osteomyelitis. Wound culture obtained last week grew strep B and OSSA; she is initiated on keflex and cefdinir today; there is erythema to the toe which could be from ace wrap compression, she has [name] history of wrapping too tight and has has been encouraged to maintain ace wraps that we place today. We will hold off on application of apligraf today, will apply next week after antibiotic therapy has been initiated. She admits today that she has resumed taking [name] shower with her foot/toe submerged in water, she has been reminded to keep foot/toe out of the bath water. She will be seen in follow up next week 06/21/18-she is seen in follow-up evaluation for left great toe ulcer. She is tolerating antibiotic therapy with no GI disturbance. The wound is stable. Apligraf was applied today. She has been decreasing her smoking, only had 4 cigarettes yesterday and Branca, Kenyanna [name]. ([id number]) 1 today. She continues being more compliant in diabetic diet. She will follow-up next week for evaluation of site, if stable will remove at 2 weeks. 06/28/18- She is here in  follow up evalution. Apligraf was placed last week, she states the dressing fell off on Tuesday and she was dressing with hydrofera blue. She is healed and will be discharged from the clinic today. She has been instructed to continue with smoking cessation, continue monitoring glucose levels, offloading for an additional 4 weeks and continue with hydrofera blue for additional two weeks for any possible microscopic opening. Readmission: 08/07/18 on evaluation today patient presents for reevaluation concerning the ulcer of her right great toe. She was previously discharged on 06/28/18 healed. Nonetheless she states that this began to show signs of drainage she subsequently went to her primary care provider. Subsequently an x-ray was performed on 08/01/18 which was negative. The patient was also placed on antibiotics at that time. Fortunately they should have been effective for the infection. Nonetheless she's been experiencing some improvement but still has [name] lot of drainage coming from the wound itself. 08/14/18 on evaluation today patient's wound actually does show signs of improvement in regard to the erythema at this point. She has completed the antibiotics. With that being said we did discuss the possibility of placing her in [name] total contact cast as of today although I think that I may want to give this just [name] little bit more time to ensure nothing recurrence as far as her infection is concerned. I do not want to put in the cast and risk infection at that time if things are not completely resolved. With that being said she is gonna require some debridement today. 08/21/18 on evaluation today patient actually appears  to be doing okay in regard to her toe ulcer. She's been tolerating the dressing changes without complication. With that being said it does appear that she is ready and in fact I think it's appropriate for [place] to go ahead and initiate the total contact cast today. Nonetheless she will  require some sharp debridement to prepare the wound for application. Overall I feel like things have been progressing well but we do need to do something to get this to close more readily. 08/24/18 patient seen today for reevaluation after having had the total contact cast applied on Tuesday. She seems to have done very well the wound appears to be doing great and overall I'm pleased with the progress that she's made. There were no abnormal areas of rubbing from the cast on her lower extremity. 08/30/18 on evaluation today patient actually appears to be completely healed in regard to her plantar toe ulcer. She tells me at this point she's been having [name] lot of issues with the cast. She almost fell [name] couple of times the state shall the step of her dog [name] couple times as well. This is been [name] very frustrating process for her other nonetheless she has completely healed the wound which is excellent news. Overall there does not appear to be the evidence of infection at this time which is great news. 09/11/18 evaluation today patient presents for follow-up concerning her great toe ulcer on the left which has unfortunately reopened since I last saw her which was only [name] couple of weeks ago. Unfortunately she was not able to get in to get the shoe and potentially the AFO that's gonna be necessary due to her left foot drop. She continues with offloading shoe but this is not enough to prevent her from reopening it appears. When we last had her in the total contact cast she did well from [name] healing standpoint but unfortunately the wound reopened as soon as she came out of the cast within just [name] couple of weeks. 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 This is also part of the reason why she falls on [name] regular basis. I  just do not believe that is gonna be safe for [place] to reinitiate the total contact cast as last time we had this on she fell 3 times one week which is definitely not normal for her. 09/18/18 upon evaluation today the patient actually appears to be doing about the same in regard to her toe ulcer. She did not contact [facility] as I asked her to even though I had given her the prescription. In fact she actually states that she has no idea where the prescription is. She did apparently call [facility] and they told her that all she needed to do was bring the prescription in order to be able to be seen and work on getting the AFO for her left foot. With all that being said she still does not have an appointment and I'm not sure were things stand that regard. I will give her [name] new prescription today in order to contact them to get this set up. 09/25/18 on evaluation today patient actually appears to be doing about the same in regard to her toes ulcer. She does have [name] small areas which seems to have  [name] lot of callous buildup around the edge of the wound which is going to need sharp debridement today. She still is waiting to be scheduled for evaluation with [facility] for possibility of an AFO. She states there supposed to call her tomorrow to get this set up. Unfortunately it does appear that her foot specifically the toe area is showing signs of erythema. There does not appear to be any systemic infection which is in these good news. JULY, LINAM [name]. ([id number]) 10/02/18 on evaluation today patient actually appears to be doing about the same in regard to her toe ulcer. This really has not done too well although it's not significantly larger it's also not significantly smaller. She has been tolerating the dressing changes without complication. She actually has her appointment with [facility] tomorrow to hopefully be measured for obtaining and AFO splint. I think this would be helpful preventing this from  reoccurring. We had contemplated starting the cast this week although to be honest I am reluctant to do that as she's been having nausea, vomiting, and seizure activity over the past three days. She has [name] history of seizures and have been told is nothing that can be done for these. With that being said I do believe that along with the seizures have the nausea vomiting which upon further questioning doesn't seem to be the normal for her and makes me concerned for the possibility of infection or something else going on. I discussed this with the patient and her mother during the office visit today. I do not feel the wound is effective but maybe something else. The responses this was "this just happens to her at times and we don't know why". They did not seem to be interested in going to the hospital to have this checked out further. 10/09/18 on evaluation today patient presents for follow-up concerning her ongoing toe ulcer. She has been tolerating the dressing changes without complication. Fortunately there does not appear to be any evidence of infection which is great news however I do think that the patient would benefit from going ahead for with the total contact cast. She's actually in [name] wheelchair today she tells me that she will use her walker if we initiate the cast. I was very specific about the fact that if we were gonna do the cast I wanted to make sure that she was using the walker in order to prevent any falls. She tells me she does not have stairs that she has to traverse on [name] regular basis at her home. She has not had any seizures since last week again that something that happens to her often she tells me she did talk to [employer] and they said that it may take up to three weeks to get the brace approved for her. Hopefully that will not take that long but nonetheless in the meantime I do think the cast could be of benefit. 10/12/18 on evaluation today patient appears to be doing rather  well in regard to her toe ulcer. It's just been [name] few days and already this is significantly improved both as far as overall appearance and size. Fortunately there's no sign of infection. She is here for her first obligatory cast change. 10/19/18 Seen today for follow up and management of left great toe ulcer. Wound continues to show improvement. Noted small open area with seroussang drainage with palpation. Denies any increased pain or recent fevers during visit. She will continue calcium alginate with offloading shoe. Denies any  questions or concerns during visit. 10/26/18 on evaluation today patient appears to be doing about the same as when I last saw her in regard to her wound bed. Fortunately there does not appear to be any signs of infection. Unfortunately she continues to have [name] breakdown in regard to the toe region any time that she is not in the cast. It takes almost no time at all for this to happen. Nonetheless she still has not heard anything from the brace being made by [employer] as to when exactly this will be available to her. Fortunately there is no signs of infection at this time. 10/30/18 on evaluation today patient presents for application of the total contact cast as we just received him this morning. Fortunately we are gonna be able to apply this to her today which is great news. She continues to have no significant pain which is good news. Overall I do feel like things have been improving while she was the cast is when she doesn't have [name] cast that things get worse. She still has not really heard anything from [place] regarding her brace. 11/02/18 upon evaluation today patient's wound already appears to be doing significantly better which is good news. Fortunately there does not appear to be any signs of infection also good news. Overall I do think the total contact cast as before is helping to heal this area unfortunately it's just not gonna likely keep the area closed and healed  without her getting her brace at least. Again the foot drop is [name] significant issue for her. 11/09/18 on evaluation today patient appears to be doing excellent in regard to her toe ulcer which in fact is completely healed. Fortunately we finally got the situation squared away with the paperwork which was needed to proceed with getting her brace approved by Medicaid. I have filled that out unfortunately that information has been sent to the orthopedic office that I worked at 2 1/2 years ago and not tired Current wound care measures. Fortunately she seems to be doing very well at this time. Patient History Information obtained from Patient. Social History Current every day smoker. Review of Systems (ROS) Constitutional Symptoms (General Health) Marcon, Gracey ([id number]) Denies complaints or symptoms of Fever, Chills. Respiratory The patient has no complaints or symptoms. Cardiovascular The patient has no complaints or symptoms. Psychiatric The patient has no complaints or symptoms. Objective Constitutional Well-nourished and well-hydrated in no acute distress. Vitals Time Taken: 11:17 AM, Height: 69 in, Weight: 240 lbs, BMI: 35.4, Temperature: 98.1 F, Pulse: 91 bpm, Respiratory Rate: 16 breaths/min, Blood Pressure: 107/78 mmHg. Respiratory normal breathing without difficulty. clear to auscultation bilaterally. Cardiovascular regular rate and rhythm with normal S1, S2. Psychiatric this patient is able to make decisions and demonstrates good insight into disease process. Alert and Oriented x 3. pleasant and cooperative. General Notes: Obviously patient required no debridement at this point the wound shows complete epithelialization. Integumentary (Hair, Skin) Wound #4R status is Healed - Epithelialized. Original cause of wound was Gradually Appeared. The wound is located on the Left Toe Great. The wound measures 0cm length x 0cm width x 0cm depth; 0cm^2 area and 0cm^3 volume. There  is no tunneling or undermining noted. There is [name] none present amount of drainage noted. The wound margin is flat and intact. There is large (67-100%) red granulation within the wound bed. There is [name] small (1-33%) amount of necrotic tissue within the wound bed including Adherent Slough. The periwound skin appearance exhibited: Scarring. The  periwound skin appearance did not exhibit: Callus, Crepitus, Excoriation, Induration, Rash, Dry/Scaly, Maceration, Atrophie Blanche, Cyanosis, Ecchymosis, Hemosiderin Staining, Mottled, Pallor, Rubor, Erythema. Periwound temperature was noted as No Abnormality. Assessment Active Problems ICD-10 Type 2 diabetes mellitus with foot ulcer Non-pressure chronic ulcer of other part of left foot with fat layer exposed Foot drop, left foot Rosamond, Amando [name]. ([id number]) Plan Follow-up Appointments: Other: - as needed - keep 11/23/2018 appointment if your toe is open at that time Off-Loading: Other: - felt to the toe At this point I am going to see her back for [name] two week check just to ensure everything is remaining close. The patient is in agreement with that plan. If anything changes in the interim she will contact the office and let me know. Please see above for specific wound care orders. We will see patient for re-evaluation in 2 week(s) here in the clinic. If anything worsens or changes patient will contact our office for additional recommendations. Electronic Signature(s) Signed: 11/15/2018 9:23:56 AM By: Worthy Keeler PA-C Entered By: Worthy Keeler on 11/15/2018 09:05:05 [name] ([id number]) -------------------------------------------------------------------------------- ROS/PFSH Details Patient Name: [name] Date of Service: 11/09/2018 11:00 AM Medical Record Number: [id number] Patient Account Number: [id number] Date of Birth/Sex: Jul 29, 1976 (43 y.o. F) Treating RN: Montey Hora Primary Care Provider: Priscille Kluver Other  Clinician: Referring Provider: Priscille Kluver Treating Provider/Extender: STONE III, HOYT Weeks in Treatment: 13 Information Obtained From Patient Wound History Do you currently have one or more open woundso Yes How many open wounds do you currently haveo 1 Constitutional Symptoms (General Health) Complaints and Symptoms: Negative for: Fever; Chills Eyes Medical History: Positive for: Cataracts; Glaucoma; Optic Neuritis Ear/Nose/Mouth/Throat Medical History: Positive for: Chronic sinus problems/congestion; Middle ear problems Respiratory Complaints and Symptoms: No Complaints or Symptoms Cardiovascular Complaints and Symptoms: No Complaints or Symptoms Endocrine Medical History: Positive for: Type II Diabetes Time with diabetes: 10 years Treated with: Insulin, Oral agents Blood sugar tested every day: No Blood sugar testing results: Lunch: 350 Psychiatric Complaints and Symptoms: No Complaints or Symptoms HBO Extended History Items Waren, Sahiti [name]. ([id number]) Eyes: Eyes: Ear/Nose/Mouth/Throat: Ear/Nose/Mouth/Throat: Cataracts Glaucoma Chronic sinus Middle ear problems problems/congestion Immunizations Pneumococcal Vaccine: Received Pneumococcal Vaccination: No Implantable Devices Family and Social History Current every day smoker Physician Affirmation I have reviewed and agree with the above information. Electronic Signature(s) Signed: 11/15/2018 9:23:56 AM By: Worthy Keeler PA-C Signed: 11/15/2018 5:09:04 PM By: Montey Hora Entered By: Worthy Keeler on 11/15/2018 09:04:44 Dacruz, Arlanda AMarland Kitchen ([id number]) -------------------------------------------------------------------------------- SuperBill Details Patient Name: [name] Date of Service: 11/09/2018 Medical Record Number: [id number] Patient Account Number: [id number] Date of Birth/Sex: [birth year] (43 y.o. F) Treating RN: Montey Hora Primary Care Provider: Priscille Kluver Other  Clinician: Referring Provider: Priscille Kluver Treating Provider/Extender: Melburn Hake, HOYT Weeks in Treatment: 13 Diagnosis Coding ICD-10 Codes Code Description E11.621 Type 2 diabetes mellitus with foot ulcer L97.522 Non-pressure chronic ulcer of other part of left foot with fat layer exposed M21.372 Foot drop, left foot Facility Procedures CPT4 Code: [id number] Description: [phone] - WOUND CARE VISIT-LEV 2 EST PT Modifier: Quantity: 1 Physician Procedures CPT4 Code: [id number] Description: 41937 - WC PHYS LEVEL 3 - EST PT ICD-10 Diagnosis Description E11.621 Type 2 diabetes mellitus with foot ulcer L97.522 Non-pressure chronic ulcer of other part of left foot with fa M21.372 Foot drop, left foot Modifier: t layer exposed Quantity: 1 Electronic Signature(s) Signed: 11/12/2018 9:58:15 PM By: Worthy Keeler PA-C Entered By: Worthy Keeler on  11/12/2018 21:51:01 

## 2018-11-23 ENCOUNTER — Encounter: Payer: Medicaid Other | Admitting: Physician Assistant

## 2018-11-23 DIAGNOSIS — E11621 Type 2 diabetes mellitus with foot ulcer: Secondary | ICD-10-CM | POA: Diagnosis not present

## 2018-11-25 NOTE — Progress Notes (Signed)
[name], [name] ([id number]) Visit Report for 11/23/2018 Chief Complaint Document Details Patient Name: [name], [name] Date of Service: 11/23/2018 11:00 AM Medical Record Number: [id number] Patient Account Number: [id number] Date of Birth/Sex: August 01, 1976 (43 y.o. F) Treating RN: Montey Hora Primary Care Provider: Priscille Kluver Other Clinician: Referring Provider: Priscille Kluver Treating Provider/Extender: Melburn Hake, HOYT Weeks in Treatment: 15 Information Obtained from: Patient Chief Complaint Left great toe ulcer Electronic Signature(s) Signed: 11/23/2018 5:12:31 PM By: Worthy Keeler PA-C Entered By: Worthy Keeler on 11/23/2018 11:17:56 [name], [name] ([id number]) -------------------------------------------------------------------------------- Debridement Details Patient Name: Heather Dear A. Date of Service: 11/23/2018 11:00 AM Medical Record Number: [id number] Patient Account Number: [id number] Date of Birth/Sex: Dec 27, 1975 (43 y.o. F) Treating RN: Montey Hora Primary Care Provider: Priscille Kluver Other Clinician: Referring Provider: Priscille Kluver Treating Provider/Extender: STONE III, HOYT Weeks in Treatment: 15 Debridement Performed for Wound #5 Left Toe Great Assessment: Performed By: Physician STONE III, HOYT E., PA-C Debridement Type: Debridement Severity of Tissue Pre Fat layer exposed Debridement: Level of Consciousness (Pre- Awake and Alert procedure): Pre-procedure Verification/Time Yes - 12:13 Out Taken: Start Time: 12:13 Pain Control: Lidocaine 4% Topical Solution Total Area Debrided (L x W): 1 (cm) x 0.5 (cm) = 0.5 (cm) Tissue and other material Viable, Non-Viable, Callus, Slough, Subcutaneous, Slough debrided: Level: Skin/Subcutaneous Tissue Debridement Description: Excisional Instrument: Curette Bleeding: Minimum Hemostasis Achieved: Pressure End Time: 12:18 Procedural Pain: 0 Post Procedural Pain: 0 Response to Treatment: Procedure was  tolerated well Level of Consciousness Awake and Alert (Post-procedure): Post Debridement Measurements of Total Wound Length: (cm) 1 Width: (cm) 0.5 Depth: (cm) 0.6 Volume: (cm) 0.236 Character of Wound/Ulcer Post Debridement: Improved Severity of Tissue Post Debridement: Fat layer exposed Post Procedure Diagnosis Same as Pre-procedure Electronic Signature(s) Signed: 11/23/2018 5:12:31 PM By: Worthy Keeler PA-C Signed: 11/23/2018 5:16:27 PM By: Montey Hora Entered By: Montey Hora on 11/23/2018 12:18:37 [name] ([id number]) -------------------------------------------------------------------------------- HPI Details Patient Name: Heather Dear A. Date of Service: 11/23/2018 11:00 AM Medical Record Number: [id number] Patient Account Number: [id number] Date of Birth/Sex: 06-Dec-1975 (43 y.o. F) Treating RN: Montey Hora Primary Care Provider: Priscille Kluver Other Clinician: Referring Provider: Priscille Kluver Treating Provider/Extender: Melburn Hake, HOYT Weeks in Treatment: 15 History of Present Illness HPI Description: 01/18/18-She is here for initial evaluation of the left great toe ulcer. She is a poor historian in regards to timeframe in detail. She states approximately 4 weeks ago she lacerated her toe on something in the house. She followed up with her primary care who placed her on Bactrim and ultimately a second dose of Bactrim prior to coming to wound clinic. She states she has been treating the toe with peroxide, Betadine and a Band-Aid. She did not check her blood sugar this morning but checked it yesterday morning it was 327; she is unaware of a recent A1c and there are no current records. She saw Dr. she would've orthopedics last week for an old injury to the left ankle, she states he did not see her toe, nor did she bring it to his attention. She smokes approximately 1 pack cigarettes a day. Her social situation is concerning, she arrives this morning with her  mother who appears extremely intoxicated/under the influence; her mother was asked to leave the room and be monitored by the patient's grandmother. The patient's aunt then accompanied the patient and the room throughout the rest of the appointment. We had a lengthy discussion regarding the deleterious effects of uncontrolled hyperglycemia and smoking  as it relates to wound healing and overall health. She was strongly encouraged to decrease her smoking and get her diabetes under better control. She states she is currently on a diet and has cut down her Navos consumption. The left toe is erythematous, macerated and slightly edematous with malodor present. The edema in her left foot is below her baseline, there is no erythema streaking. We will treat her with Santyl, doxycycline; we have ordered and xray, culture and provided a Peg assist surgical shoe and cultured the wound. 01/25/18-She is here in follow-up evaluation for a left great toe ulcer and presents with an abscess to her suprapubic area. She states her blood sugars remain elevated, feeling "sick" and if levels are below 250, but she is trying. She has made no attempt to decrease her smoking stating that we "can't take away her food in her cigarettes". She has been compliant with offloading using the PEG assist you. She is using Santyl daily. the culture obtained last week grew staph aureus and Enterococcus faecalis; continues on the doxycycline and Augmentin was added on Monday. The suprapubic area has erythema, no femoral variation, purple discoloration, minimal induration, was accessed with a cotton tip applicator with sanguinopurulent drainage, this was cultured, I suspect the current antibiotic treatment will cover and we will not add anything to her current treatment plan. She was advised to go to urgent care or ER with any change in redness, induration or fever. 02/01/18-She is here in follow-up evaluation for left great toe ulcers  and a new abdominal abscess from last week. She was able to use packing until earlier this week, where she "forgot it was there". She states she was feeling ill with GI symptoms last week and was not taking her antibiotic. She states her glucose levels have been predominantly less than 200, with occasional levels between 200-250. She thinks this was contributing to her GI symptoms as they have resolved without intervention. There continues to be significant laceration to left toe, otherwise it clinically looks stable/improved. There is now less superficial opening to the lateral aspect of the great toe that was residual blister. We will transition to [facility] to all wounds, she will continue her Augmentin. If there is no change or deterioration next week for reculture. 02/08/18-She is here in follow-up evaluation for left great toe ulcer and abdominal ulcer. There is an improvement in both wounds. She has been wrapping her left toe with coban, not by our direction, which has created an area of discoloration to the medial aspect; she has been advised to NOT use coban secondary to her neuropathy. She states her glucose levels have been high over this last week ranging from 200-350, she continues to smoke. She admits to being less compliant with her offloading shoe. We will continue with same treatment plan and she will follow-up next week. 02/15/18-She is here in follow-up evaluation for left great toe ulcer and abdominal ulcer. The abdominal ulcer is epithelialized. The left great toe ulcer is improved and all injury from last week using the Coban wrap is resolved, the lateral ulcer is healed. She admits to noncompliance with wearing offloading shoe and admits to glucose levels being greater than 300 most of the week. She continues to smoke and expresses no desire to quit. There is one area medially that probes deeper than it has historically, erythema to the toe and dorsal foot has consistently  waxed and waned. There is no overt signs of cellulitis or infection but we  will culture the wound for any occult infection given the new area of depth and erythema. We will hold off on sensitivities for initiation of antibiotic therapy. 02/22/18-She is here in follow up evaluation for left great toe ulcer. There is overall significant improvement in both wound appearance, erythema and edema with changes made last week. She was not initiated on antibiotic therapy. Culture obtained last week showed oxacillin sensitive staph aureus, sensitive to clindamycin. Clindamycin has been called into the pharmacy but she has been instructed to hold off on initiation secondary to overall clinical improvement and her history of antibiotic intolerance. She has been instructed to contact the clinic with any noted changes/deterioration and the wound, erythema, Winfree, Demia A. ([id number]) edema and/or pain. She will follow-up next week. She continues to smoke and her glucose levels remain elevated >250; she admits to compliance with offloading shoe 03/01/18 on evaluation today patient appears to be doing fairly well in regard to her left first toe ulcer. She has been tolerating the dressing changes with the [facility] Dressing without complication and overall this has definitely showed signs of improvement according to records as well is what the patient tells me today. I'm very pleased in that regard. She is having no pain today 03/08/18 She is here for follow up evaluation of a left great toe ulcer. She remains non-compliant with glucose control and smoking cessation; glucose levels consistently >200. She states that she got new shoe inserts/peg assist. She admits to compliance with offloading. Since my last evaluation there is significant improvement. We will switch to prisma at this time and she will follow up next week. She is noted to be tachycardic at this appointment, heart rate 120s; she has a history of  heart rate 70-130 according to our records. She admits to extreme agitation r/t personal issues; she was advised to monitor her heartrate and contact her physician if it does not return to a more normal range (<100). She takes cardizem twice daily. 03/15/18-She is here in follow-up evaluation for left great toe ulcer. She remains noncompliant with glucose control and smoking cessation. She admits to compliance with wearing offloading shoe. The ulcer is improved/stable and we will continue with the same treatment plan and she will follow-up next week 03/22/18-She is here for evaluation for left great toe ulcer. There continues to be significant improvement despite recurrent hyperglycemia (over 500 yesterday) and she continues to smoke. She has been compliant with offloading and we will continue with same treatment plan and she will follow-up next week. 03/29/18-She is here for evaluation for left great toe ulcer. Despite continuing to smoke and uncontrolled diabetes she continues to improve. She is compliant with offloading shoe. We will continue with the same treatment plan and she will follow-up next week 04/05/18- She is here in follow up evaluation for a left great toe ulcer; she presents with small pustule to left fifth toe (resembles ant bite). She admits to compliance with wearing offloading shoe; continues to smoke or have uncontrolled blood glucose control. There is more callus than usual with evidence of bleeding; she denies known trauma. 04/12/18-She is here for evaluation of left great toe ulcer. Despite noncompliance with glycemic control and smoking she continues to make improvement. She continues to wear offloading shoe. The pustule, that was identified last week, to the left fifth toe is resolved. She will follow-up in 2 weeks 05/03/18-she is seen in follow-up evaluation for a left great toe ulcer. She is compliant with offloading, otherwise noncompliant with  glycemic control and smoking.  She has plateaued and there is minimal improvement noted. We will transition to [facility], replaced the insert to her surgical shoe and she will follow-up in one week 05/10/18- She is here in follow up evaluation for a left great toe ulcer. It appears stable despite measurement change. We will continue with same treatment plan and follow up next week. 05/24/18-She is seen in follow-up evaluation for a left great toe ulcer. She remains compliant with offloading, has made significant improvement in her diet, decreasing the amount of sugar/soda. She said her recent A1c was 10.9 which is lower than. She did see a diabetic nutritionist/educator yesterday. She continues to smoke. We will continue with the same treatment plan and she'll follow-up next week. 05/31/18- She is seen in follow-up evaluation for left great toe ulcer. She continues to remain compliant with offloading, continues to make improvement in her diet, increasing her water and decreasing the amount of sugar/soda. She does continue to smoke with no desire to quit. We will apply Prisma to the depth and Hydrofera Blue over. We have not received insurance authorization for oasis. She will follow up next week. 06/07/18-She is seen in follow-up evaluation for left great toe ulcer. It has stalled according to today's measurements although base appears stable. She says she saw a diabetic educator yesterday; her average blood sugars are less than 300 which is an improvement for her. She continues to smoke and states "that's my next step" She continues with water over soda. We will order for xray, culture and reinstate ace wrap compression prior to placing apligraf for next week. She is voicing no complaints or concerns. Her dressing will change to iodoflex over the next week in preparation for apligraf. 06/14/18-She is seen in follow-up evaluation for left great toe ulcer. Plain film x-ray performed last week was negative for osteomyelitis. Wound  culture obtained last week grew strep B and OSSA; she is initiated on keflex and cefdinir today; there is erythema to the toe which could be from ace wrap compression, she has a history of wrapping too tight and has has been encouraged to maintain ace wraps that we place today. We will hold off on application of apligraf today, will apply next week after antibiotic therapy has been initiated. She admits today that she has resumed taking a shower with her foot/toe submerged in water, she has been reminded to keep foot/toe out of the bath water. She will be seen in follow up next week 06/21/18-she is seen in follow-up evaluation for left great toe ulcer. She is tolerating antibiotic therapy with no GI disturbance. The wound is stable. Apligraf was applied today. She has been decreasing her smoking, only had 4 cigarettes yesterday and 1 today. She continues being more compliant in diabetic diet. She will follow-up next week for evaluation of site, if stable will remove at 2 weeks. 06/28/18- She is here in follow up evalution. Apligraf was placed last week, she states the dressing fell off on Tuesday and she was dressing with hydrofera blue. She is healed and will be discharged from the clinic today. She has been instructed to continue with smoking cessation, continue monitoring glucose levels, offloading for an additional 4 weeks and continue with Vila, Chessie A. ([id number]) hydrofera blue for additional two weeks for any possible microscopic opening. Readmission: 08/07/18 on evaluation today patient presents for reevaluation concerning the ulcer of her right great toe. She was previously discharged on 06/28/18 healed. Nonetheless she states that this  began to show signs of drainage she subsequently went to her primary care provider. Subsequently an x-ray was performed on 08/01/18 which was negative. The patient was also placed on antibiotics at that time. Fortunately they should have been effective for  the infection. Nonetheless she's been experiencing some improvement but still has a lot of drainage coming from the wound itself. 08/14/18 on evaluation today patient's wound actually does show signs of improvement in regard to the erythema at this point. She has completed the antibiotics. With that being said we did discuss the possibility of placing her in a total contact cast as of today although I think that I may want to give this just a little bit more time to ensure nothing recurrence as far as her infection is concerned. I do not want to put in the cast and risk infection at that time if things are not completely resolved. With that being said she is gonna require some debridement today. 08/21/18 on evaluation today patient actually appears to be doing okay in regard to her toe ulcer. She's been tolerating the dressing changes without complication. With that being said it does appear that she is ready and in fact I think it's appropriate for [place] to go ahead and initiate the total contact cast today. Nonetheless she will require some sharp debridement to prepare the wound for application. Overall I feel like things have been progressing well but we do need to do something to get this to close more readily. 08/24/18 patient seen today for reevaluation after having had the total contact cast applied on Tuesday. She seems to have done very well the wound appears to be doing great and overall I'm pleased with the progress that she's made. There were no abnormal areas of rubbing from the cast on her lower extremity. 08/30/18 on evaluation today patient actually appears to be completely healed in regard to her plantar toe ulcer. She tells me at this point she's been having a lot of issues with the cast. She almost fell a couple of times the state shall the step of her dog a couple times as well. This is been a very frustrating process for her other nonetheless she has completely healed the  wound which is excellent news. Overall there does not appear to be the evidence of infection at this time which is great news. 09/11/18 evaluation today patient presents for follow-up concerning her great toe ulcer on the left which has unfortunately reopened since I last saw her which was only a couple of weeks ago. Unfortunately she was not able to get in to get the shoe and potentially the AFO that's gonna be necessary due to her left foot drop. She continues with offloading shoe but this is not enough to prevent her from reopening it appears. When we last had her in the total contact cast she did well from a healing standpoint but unfortunately the wound reopened as soon as she came out of the cast within just a couple of weeks. 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 This is also part of the reason why she falls on a regular basis. I just do not believe that is gonna be safe for [place] to reinitiate the total contact cast as last time we had  this on she fell 3 times one week which is definitely not normal for her. 09/18/18 upon evaluation today the patient actually appears to be doing about the same in regard to her toe ulcer. She did not contact [facility] as I asked her to even though I had given her the prescription. In fact she actually states that she has no idea where the prescription is. She did apparently call [facility] and they told her that all she needed to do was bring the prescription in order to be able to be seen and work on getting the AFO for her left foot. With all that being said she still does not have an appointment and I'm not sure were things stand that regard. I will give her a new prescription today in order to contact them to get this set up. 09/25/18 on evaluation today patient  actually appears to be doing about the same in regard to her toes ulcer. She does have a small areas which seems to have a lot of callous buildup around the edge of the wound which is going to need sharp debridement today. She still is waiting to be scheduled for evaluation with [facility] for possibility of an AFO. She states there supposed to call her tomorrow to get this set up. Unfortunately it does appear that her foot specifically the toe area is showing signs of erythema. There does not appear to be any systemic infection which is in these good news. 10/02/18 on evaluation today patient actually appears to be doing about the same in regard to her toe ulcer. This really has not done too well although it's not significantly larger it's also not significantly smaller. She has been tolerating the dressing changes without complication. She actually has her appointment with [facility] and [place] tomorrow to hopefully be measured for obtaining and AFO splint. I think this would be helpful preventing this from reoccurring. We had contemplated starting the cast this week although to be honest I am reluctant to do that as she's been having nausea, vomiting, and seizure activity over the past three days. She has a history of seizures and have been told is nothing that can be done for these. With Pommier, Dema A. ([id number]) that being said I do believe that along with the seizures have the nausea vomiting which upon further questioning doesn't seem to be the normal for her and makes me concerned for the possibility of infection or something else going on. I discussed this with the patient and her mother during the office visit today. I do not feel the wound is effective but maybe something else. The responses this was "this just happens to her at times and we don't know why". They did not seem to be interested in going to the hospital to have this checked out further. 10/09/18 on evaluation today patient  presents for follow-up concerning her ongoing toe ulcer. She has been tolerating the dressing changes without complication. Fortunately there does not appear to be any evidence of infection which is great news however I do think that the patient would benefit from going ahead for with the total contact cast. She's actually in a wheelchair today she tells me that she will use her walker if we initiate the cast. I was very specific about the fact that if we were gonna do the cast I wanted to make sure that she was using the walker in order to prevent any falls. She tells me she does not have stairs that she  has to traverse on a regular basis at her home. She has not had any seizures since last week again that something that happens to her often she tells me she did talk to [employer] and they said that it may take up to three weeks to get the brace approved for her. Hopefully that will not take that long but nonetheless in the meantime I do think the cast could be of benefit. 10/12/18 on evaluation today patient appears to be doing rather well in regard to her toe ulcer. It's just been a few days and already this is significantly improved both as far as overall appearance and size. Fortunately there's no sign of infection. She is here for her first obligatory cast change. 10/19/18 Seen today for follow up and management of left great toe ulcer. Wound continues to show improvement. Noted small open area with seroussang drainage with palpation. Denies any increased pain or recent fevers during visit. She will continue calcium alginate with offloading shoe. Denies any questions or concerns during visit. 10/26/18 on evaluation today patient appears to be doing about the same as when I last saw her in regard to her wound bed. Fortunately there does not appear to be any signs of infection. Unfortunately she continues to have a breakdown in regard to the toe region any time that she is not in the cast. It takes  almost no time at all for this to happen. Nonetheless she still has not heard anything from the brace being made by [employer] as to when exactly this will be available to her. Fortunately there is no signs of infection at this time. 10/30/18 on evaluation today patient presents for application of the total contact cast as we just received him this morning. Fortunately we are gonna be able to apply this to her today which is great news. She continues to have no significant pain which is good news. Overall I do feel like things have been improving while she was the cast is when she doesn't have a cast that things get worse. She still has not really heard anything from [place] regarding her brace. 11/02/18 upon evaluation today patient's wound already appears to be doing significantly better which is good news. Fortunately there does not appear to be any signs of infection also good news. Overall I do think the total contact cast as before is helping to heal this area unfortunately it's just not gonna likely keep the area closed and healed without her getting her brace at least. Again the foot drop is a significant issue for her. 11/09/18 on evaluation today patient appears to be doing excellent in regard to her toe ulcer which in fact is completely healed. Fortunately we finally got the situation squared away with the paperwork which was needed to proceed with getting her brace approved by Medicaid. I have filled that out unfortunately that information has been sent to the orthopedic office that I worked at 2 1/2 years ago and not tired Current wound care measures. Fortunately she seems to be doing very well at this time. 11/23/18 on evaluation today patient appears to be doing More Poorly Compared to Last Time I Saw Her. At [facility] She Had Completely Healed. Currently she is continuing to have issues with reopening. She states that she just found out that the brace was approved through Medicaid now she  just has to go get measured in order to have this fitted for her and then made. Subsequently she does not have an  appointment for this yet that is going to complicate things we obviously cannot put her back in the cast if we do not have everything measured because they're not gonna be able to measure her foot while she is in the cast. 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 Obviously this is unfortunate and does have me somewhat worried as well. Electronic Signature(s) Signed: 11/23/2018 5:12:31 PM By: Worthy Keeler PA-C Entered By: Worthy Keeler on 11/23/2018 14:16:15 [name] ([id number]) -------------------------------------------------------------------------------- Physical Exam Details Patient Name: Heather Dear A. Date of Service: 11/23/2018 11:00 AM Medical Record Number: [id number] Patient Account Number: [id number] Date of Birth/Sex: November 04, 1975 (43 y.o. F) Treating RN: Montey Hora Primary Care Provider: Priscille Kluver Other Clinician: Referring Provider: Priscille Kluver Treating Provider/Extender: STONE III, HOYT Weeks in Treatment: 69 Constitutional Well-nourished and well-hydrated in no acute distress. Respiratory normal breathing without difficulty. Psychiatric this patient is able to make decisions and demonstrates good insight into disease process. Alert and Oriented x 3. pleasant and cooperative. Notes On evaluation today patient's wound bed currently shows evidence of having reopened there was some callous and [place] noted which required sharp debridement at this point. She tolerated this without complication. Fortunately there's no signs of infection at this time. Electronic Signature(s) Signed: 11/23/2018 5:12:31 PM By: Worthy Keeler PA-C Entered By: Worthy Keeler on 11/23/2018 14:17:20 [name]  ([id number]) -------------------------------------------------------------------------------- Physician Orders Details Patient Name: Heather Dear A. Date of Service: 11/23/2018 11:00 AM Medical Record Number: [id number] Patient Account Number: [id number] Date of Birth/Sex: Mar 11, 1976 (43 y.o. 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 Anesthetic (add to Medication List) Wound #5 Left Toe Great o Topical Lidocaine 4% cream applied to wound bed prior to debridement (In Clinic Only). Primary Wound Dressing Wound #5 Left Toe Great o Silver Alginate Secondary Dressing Wound #5 Left Toe Great o Conform/Kerlix o Foam Dressing Change Frequency Wound #5 Left Toe Great o Change dressing every day. Follow-up Appointments Wound #5 Left Toe Great o Return Appointment in 1 week. Off-Loading Wound #5 Left Toe Great o Open toe surgical shoe with peg assist. - left foot Electronic Signature(s) Signed: 11/23/2018 5:12:31 PM By: Worthy Keeler PA-C Signed: 11/23/2018 5:16:27 PM By: Montey Hora Entered By: Montey Hora on 11/23/2018 12:22:42 [name] ([id number]) [name], [name] ([id number]) -------------------------------------------------------------------------------- Problem List Details Patient Name: [name], [name] Date of Service: 11/23/2018 11:00 AM Medical Record Number: [id number] Patient Account Number: [id number] Date of Birth/Sex: 05-06-1976 (43 y.o. F) Treating RN: Montey Hora Primary Care  Provider: Priscille Kluver Other Clinician: Referring Provider: Priscille Kluver Treating Provider/Extender: Melburn Hake, HOYT Weeks in Treatment: 15 Active Problems ICD-10 Evaluated Encounter Code Description Active Date Today Diagnosis E11.621 Type 2 diabetes mellitus with foot ulcer 08/07/2018 No Yes L97.522 Non-pressure chronic ulcer of other part of left foot with fat 08/08/2018 No Yes layer exposed M21.372 Foot drop, left foot 09/11/2018 No Yes Inactive Problems Resolved Problems ICD-10 Code Description Active Date Resolved Date  M08.676 Cellulitis of left toe 08/07/2018 08/07/2018 Electronic Signature(s) Signed: 11/23/2018 5:12:31 PM By: Worthy Keeler PA-C Entered By: Worthy Keeler on 11/23/2018 11:17:51 [name], [name] ([id number]) -------------------------------------------------------------------------------- Progress Note Details Patient Name: Heather Dear A. Date of Service: 11/23/2018 11:00 AM Medical Record Number: [id number] Patient Account Number: [id number] Date of Birth/Sex: 05-09-1976 (43 y.o. F) Treating RN: Montey Hora Primary Care Provider: Priscille Kluver Other Clinician: Referring Provider: Priscille Kluver Treating Provider/Extender: Melburn Hake, HOYT Weeks in Treatment: 15 Subjective Chief Complaint Information obtained from Patient Left great toe ulcer History of Present Illness (HPI) 01/18/18-She is here for initial evaluation of the left great toe ulcer. She is a poor historian in regards to timeframe in detail. She states approximately 4 weeks ago she lacerated her toe on something in the house. She followed up with her primary care who placed her on Bactrim and ultimately a second dose of Bactrim prior to coming to wound clinic. She states she has been treating the toe with peroxide, Betadine and a Band-Aid. She did not check her blood sugar this morning but checked it yesterday morning it was 327; she is unaware of a recent A1c and there are no current  records. She saw Dr. she would've orthopedics last week for an old injury to the left ankle, she states he did not see her toe, nor did she bring it to his attention. She smokes approximately 1 pack cigarettes a day. Her social situation is concerning, she arrives this morning with her mother who appears extremely intoxicated/under the influence; her mother was asked to leave the room and be monitored by the patient's grandmother. The patient's aunt then accompanied the patient and the room throughout the rest of the appointment. We had a lengthy discussion regarding the deleterious effects of uncontrolled hyperglycemia and smoking as it relates to wound healing and overall health. She was strongly encouraged to decrease her smoking and get her diabetes under better control. She states she is currently on a diet and has cut down her [facility] consumption. The left toe is erythematous, macerated and slightly edematous with malodor present. The edema in her left foot is below her baseline, there is no erythema streaking. We will treat her with Santyl, doxycycline; we have ordered and xray, culture and provided a Peg assist surgical shoe and cultured the wound. 01/25/18-She is here in follow-up evaluation for a left great toe ulcer and presents with an abscess to her suprapubic area. She states her blood sugars remain elevated, feeling "sick" and if levels are below 250, but she is trying. She has made no attempt to decrease her smoking stating that we "can't take away her food in her cigarettes". She has been compliant with offloading using the PEG assist you. She is using Santyl daily. the culture obtained last week grew staph aureus and Enterococcus faecalis; continues on the doxycycline and Augmentin was added on Monday. The suprapubic area has erythema, no femoral variation, purple discoloration, minimal induration, was accessed with a cotton tip applicator with sanguinopurulent drainage, this  was cultured, I suspect the current antibiotic treatment will cover and we will not add anything to her current treatment plan. She was advised to go to urgent care or ER with any change in redness, induration or fever. 02/01/18-She is here in follow-up evaluation for left great toe ulcers and a new abdominal abscess from last week. She was able to use packing until earlier this week, where she "forgot it was there". She states she  was feeling ill with GI symptoms last week and was not taking her antibiotic. She states her glucose levels have been predominantly less than 200, with occasional levels between 200-250. She thinks this was contributing to her GI symptoms as they have resolved without intervention. There continues to be significant laceration to left toe, otherwise it clinically looks stable/improved. There is now less superficial opening to the lateral aspect of the great toe that was residual blister. We will transition to [facility] to all wounds, she will continue her Augmentin. If there is no change or deterioration next week for reculture. 02/08/18-She is here in follow-up evaluation for left great toe ulcer and abdominal ulcer. There is an improvement in both wounds. She has been wrapping her left toe with coban, not by our direction, which has created an area of discoloration to the medial aspect; she has been advised to NOT use coban secondary to her neuropathy. She states her glucose levels have been high over this last week ranging from 200-350, she continues to smoke. She admits to being less compliant with her offloading shoe. We will continue with same treatment plan and she will follow-up next week. 02/15/18-She is here in follow-up evaluation for left great toe ulcer and abdominal ulcer. The abdominal ulcer is epithelialized. The left great toe ulcer is improved and all injury from last week using the Coban wrap is resolved, the lateral ulcer is healed. She admits to  noncompliance with wearing offloading shoe and admits to glucose levels being greater than 300 most of the week. She continues to smoke and expresses no desire to quit. There is one area medially that probes deeper than it has historically, erythema to the toe and dorsal foot has consistently waxed and waned. There is no overt signs of cellulitis or infection but we will culture the wound for any occult infection given the new area of depth and erythema. We will hold off on Deprey, [name] ([id number]) sensitivities for initiation of antibiotic therapy. 02/22/18-She is here in follow up evaluation for left great toe ulcer. There is overall significant improvement in both wound appearance, erythema and edema with changes made last week. She was not initiated on antibiotic therapy. Culture obtained last week showed oxacillin sensitive staph aureus, sensitive to clindamycin. Clindamycin has been called into the pharmacy but she has been instructed to hold off on initiation secondary to overall clinical improvement and her history of antibiotic intolerance. She has been instructed to contact the clinic with any noted changes/deterioration and the wound, erythema, edema and/or pain. She will follow-up next week. She continues to smoke and her glucose levels remain elevated >250; she admits to compliance with offloading shoe 03/01/18 on evaluation today patient appears to be doing fairly well in regard to her left first toe ulcer. She has been tolerating the dressing changes with the [facility] Dressing without complication and overall this has definitely showed signs of improvement according to records as well is what the patient tells me today. I'm very pleased in that regard. She is having no pain today 03/08/18 She is here for follow up evaluation of a left great toe ulcer. She remains non-compliant with glucose control and smoking cessation; glucose levels consistently >200. She states that she got  new shoe inserts/peg assist. She admits to compliance with offloading. Since my last evaluation there is significant improvement. We will switch to prisma at this time and she will follow up next week. She is noted to be tachycardic at this  appointment, heart rate 120s; she has a history of heart rate 70-130 according to our records. She admits to extreme agitation r/t personal issues; she was advised to monitor her heartrate and contact her physician if it does not return to a more normal range (<100). She takes cardizem twice daily. 03/15/18-She is here in follow-up evaluation for left great toe ulcer. She remains noncompliant with glucose control and smoking cessation. She admits to compliance with wearing offloading shoe. The ulcer is improved/stable and we will continue with the same treatment plan and she will follow-up next week 03/22/18-She is here for evaluation for left great toe ulcer. There continues to be significant improvement despite recurrent hyperglycemia (over 500 yesterday) and she continues to smoke. She has been compliant with offloading and we will continue with same treatment plan and she will follow-up next week. 03/29/18-She is here for evaluation for left great toe ulcer. Despite continuing to smoke and uncontrolled diabetes she continues to improve. She is compliant with offloading shoe. We will continue with the same treatment plan and she will follow-up next week 04/05/18- She is here in follow up evaluation for a left great toe ulcer; she presents with small pustule to left fifth toe (resembles ant bite). She admits to compliance with wearing offloading shoe; continues to smoke or have uncontrolled blood glucose control. There is more callus than usual with evidence of bleeding; she denies known trauma. 04/12/18-She is here for evaluation of left great toe ulcer. Despite noncompliance with glycemic control and smoking she continues to make improvement. She continues to wear  offloading shoe. The pustule, that was identified last week, to the left fifth toe is resolved. She will follow-up in 2 weeks 05/03/18-she is seen in follow-up evaluation for a left great toe ulcer. She is compliant with offloading, otherwise noncompliant with glycemic control and smoking. She has plateaued and there is minimal improvement noted. We will transition to [facility], replaced the insert to her surgical shoe and she will follow-up in one week 05/10/18- She is here in follow up evaluation for a left great toe ulcer. It appears stable despite measurement change. We will continue with same treatment plan and follow up next week. 05/24/18-She is seen in follow-up evaluation for a left great toe ulcer. She remains compliant with offloading, has made significant improvement in her diet, decreasing the amount of sugar/soda. She said her recent A1c was 10.9 which is lower than. She did see a diabetic nutritionist/educator yesterday. She continues to smoke. We will continue with the same treatment plan and she'll follow-up next week. 05/31/18- She is seen in follow-up evaluation for left great toe ulcer. She continues to remain compliant with offloading, continues to make improvement in her diet, increasing her water and decreasing the amount of sugar/soda. She does continue to smoke with no desire to quit. We will apply Prisma to the depth and Hydrofera Blue over. We have not received insurance authorization for oasis. She will follow up next week. 06/07/18-She is seen in follow-up evaluation for left great toe ulcer. It has stalled according to today's measurements although base appears stable. She says she saw a diabetic educator yesterday; her average blood sugars are less than 300 which is an improvement for her. She continues to smoke and states "that's my next step" She continues with water over soda. We will order for xray, culture and reinstate ace wrap compression prior to placing  apligraf for next week. She is voicing no complaints or concerns. Her dressing will  change to iodoflex over the next week in preparation for apligraf. 06/14/18-She is seen in follow-up evaluation for left great toe ulcer. Plain film x-ray performed last week was negative for osteomyelitis. Wound culture obtained last week grew strep B and OSSA; she is initiated on keflex and cefdinir today; there is erythema to the toe which could be from ace wrap compression, she has a history of wrapping too tight and has has been encouraged to maintain ace wraps that we place today. We will hold off on application of apligraf today, will apply next week after antibiotic therapy has been initiated. She admits today that she has resumed taking a shower with her foot/toe submerged in water, she has been reminded to keep foot/toe out of the bath water. She will be seen in follow up next week 06/21/18-she is seen in follow-up evaluation for left great toe ulcer. She is tolerating antibiotic therapy with no GI disturbance. The wound is stable. Apligraf was applied today. She has been decreasing her smoking, only had 4 cigarettes yesterday and [name], [name] ([id number]) 1 today. She continues being more compliant in diabetic diet. She will follow-up next week for evaluation of site, if stable will remove at 2 weeks. 06/28/18- She is here in follow up evalution. Apligraf was placed last week, she states the dressing fell off on Tuesday and she was dressing with hydrofera blue. She is healed and will be discharged from the clinic today. She has been instructed to continue with smoking cessation, continue monitoring glucose levels, offloading for an additional 4 weeks and continue with hydrofera blue for additional two weeks for any possible microscopic opening. Readmission: 08/07/18 on evaluation today patient presents for reevaluation concerning the ulcer of her right great toe. She was previously discharged on 06/28/18  healed. Nonetheless she states that this began to show signs of drainage she subsequently went to her primary care provider. Subsequently an x-ray was performed on 08/01/18 which was negative. The patient was also placed on antibiotics at that time. Fortunately they should have been effective for the infection. Nonetheless she's been experiencing some improvement but still has a lot of drainage coming from the wound itself. 08/14/18 on evaluation today patient's wound actually does show signs of improvement in regard to the erythema at this point. She has completed the antibiotics. With that being said we did discuss the possibility of placing her in a total contact cast as of today although I think that I may want to give this just a little bit more time to ensure nothing recurrence as far as her infection is concerned. I do not want to put in the cast and risk infection at that time if things are not completely resolved. With that being said she is gonna require some debridement today. 08/21/18 on evaluation today patient actually appears to be doing okay in regard to her toe ulcer. She's been tolerating the dressing changes without complication. With that being said it does appear that she is ready and in fact I think it's appropriate for [place] to go ahead and initiate the total contact cast today. Nonetheless she will require some sharp debridement to prepare the wound for application. Overall I feel like things have been progressing well but we do need to do something to get this to close more readily. 08/24/18 patient seen today for reevaluation after having had the total contact cast applied on Tuesday. She seems to have done very well the wound appears to be doing great and overall  I'm pleased with the progress that she's made. There were no abnormal areas of rubbing from the cast on her lower extremity. 08/30/18 on evaluation today patient actually appears to be completely healed in regard to her  plantar toe ulcer. She tells me at this point she's been having a lot of issues with the cast. She almost fell a couple of times the state shall the step of her dog a couple times as well. This is been a very frustrating process for her other nonetheless she has completely healed the wound which is excellent news. Overall there does not appear to be the evidence of infection at this time which is great news. 09/11/18 evaluation today patient presents for follow-up concerning her great toe ulcer on the left which has unfortunately reopened since I last saw her which was only a couple of weeks ago. Unfortunately she was not able to get in to get the shoe and potentially the AFO that's gonna be necessary due to her left foot drop. She continues with offloading shoe but this is not enough to prevent her from reopening it appears. When we last had her in the total contact cast she did well from a healing standpoint but unfortunately the wound reopened as soon as she came out of the cast within just a couple of weeks. 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 This is also part of the reason why she falls on a regular basis. I just do not believe that is gonna be safe for [place] to reinitiate the total contact cast as last time we had this on she fell 3 times one week which is definitely not normal for her. 09/18/18 upon evaluation today the patient actually appears to be doing about the same in regard to her toe ulcer. She did not contact [employer] as I asked her to even though I had given her the prescription. In fact she actually states that she has no idea where the prescription is. She did apparently call [employer] and they told her that all she needed to do was bring the prescription in order to be able to be  seen and work on getting the AFO for her left foot. With all that being said she still does not have an appointment and I'm not sure were things stand that regard. I will give her a new prescription today in order to contact them to get this set up. 09/25/18 on evaluation today patient actually appears to be doing about the same in regard to her toes ulcer. She does have a small areas which seems to have a lot of callous buildup around the edge of the wound which is going to need sharp debridement today. She still is waiting to be scheduled for evaluation with [employer] for possibility of an AFO. She states there supposed to call her tomorrow to get this set up. Unfortunately it does appear that her foot specifically the toe area is showing signs of erythema. There does not appear to be any systemic infection which is in these good news. [name], [name] ([id number]) 10/02/18 on evaluation today patient actually appears to be doing about the same in regard to her toe ulcer. This really has not done too well although it's not significantly larger it's also  not significantly smaller. She has been tolerating the dressing changes without complication. She actually has her appointment with [facility] and [place] tomorrow to hopefully be measured for obtaining and AFO splint. I think this would be helpful preventing this from reoccurring. We had contemplated starting the cast this week although to be honest I am reluctant to do that as she's been having nausea, vomiting, and seizure activity over the past three days. She has a history of seizures and have been told is nothing that can be done for these. With that being said I do believe that along with the seizures have the nausea vomiting which upon further questioning doesn't seem to be the normal for her and makes me concerned for the possibility of infection or something else going on. I discussed this with the patient and her mother during the office  visit today. I do not feel the wound is effective but maybe something else. The responses this was "this just happens to her at times and we don't know why". They did not seem to be interested in going to the hospital to have this checked out further. 10/09/18 on evaluation today patient presents for follow-up concerning her ongoing toe ulcer. She has been tolerating the dressing changes without complication. Fortunately there does not appear to be any evidence of infection which is great news however I do think that the patient would benefit from going ahead for with the total contact cast. She's actually in a wheelchair today she tells me that she will use her walker if we initiate the cast. I was very specific about the fact that if we were gonna do the cast I wanted to make sure that she was using the walker in order to prevent any falls. She tells me she does not have stairs that she has to traverse on a regular basis at her home. She has not had any seizures since last week again that something that happens to her often she tells me she did talk to [employer] and they said that it may take up to three weeks to get the brace approved for her. Hopefully that will not take that long but nonetheless in the meantime I do think the cast could be of benefit. 10/12/18 on evaluation today patient appears to be doing rather well in regard to her toe ulcer. It's just been a few days and already this is significantly improved both as far as overall appearance and size. Fortunately there's no sign of infection. She is here for her first obligatory cast change. 10/19/18 Seen today for follow up and management of left great toe ulcer. Wound continues to show improvement. Noted small open area with seroussang drainage with palpation. Denies any increased pain or recent fevers during visit. She will continue calcium alginate with offloading shoe. Denies any questions or concerns during visit. 10/26/18 on  evaluation today patient appears to be doing about the same as when I last saw her in regard to her wound bed. Fortunately there does not appear to be any signs of infection. Unfortunately she continues to have a breakdown in regard to the toe region any time that she is not in the cast. It takes almost no time at all for this to happen. Nonetheless she still has not heard anything from the brace being made by [employer] as to when exactly this will be available to her. Fortunately there is no signs of infection at this time. 10/30/18 on evaluation today patient presents for application of  the total contact cast as we just received him this morning. Fortunately we are gonna be able to apply this to her today which is great news. She continues to have no significant pain which is good news. Overall I do feel like things have been improving while she was the cast is when she doesn't have a cast that things get worse. She still has not really heard anything from [employer] regarding her brace. 11/02/18 upon evaluation today patient's wound already appears to be doing significantly better which is good news. Fortunately there does not appear to be any signs of infection also good news. Overall I do think the total contact cast as before is helping to heal this area unfortunately it's just not gonna likely keep the area closed and healed without her getting her brace at least. Again the foot drop is a significant issue for her. 11/09/18 on evaluation today patient appears to be doing excellent in regard to her toe ulcer which in fact is completely healed. Fortunately we finally got the situation squared away with the paperwork which was needed to proceed with getting her brace approved by Medicaid. I have filled that out unfortunately that information has been sent to the orthopedic office that I worked at 2 1/2 years ago and not tired Current wound care measures. Fortunately she seems to be doing very well at this  time. 11/23/18 on evaluation today patient appears to be doing More Poorly Compared to Last Time I Saw Her. At [facility] She Had Completely Healed. Currently she is continuing to have issues with reopening. She states that she just found out that the brace was approved through Medicaid now she just has to go get measured in order to have this fitted for her and then made. Subsequently she does not have an appointment for this yet that is going to complicate things we obviously cannot put her back in the cast if we do not have everything measured because they're not gonna be able to measure her foot while she is in the cast. 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 Obviously this is unfortunate and does have me somewhat worried as well. [name], [name] ([id number]) Patient History Information obtained from Patient. Social History Current every day smoker. Medical History Eyes Patient has history of Cataracts, Glaucoma, Optic Neuritis Ear/Nose/Mouth/Throat Patient has history of Chronic sinus problems/congestion, Middle ear problems Endocrine Patient has history of Type II Diabetes Review of Systems (ROS) Constitutional Symptoms ([place]) Denies complaints or symptoms of Fever, Chills. Respiratory The patient has no complaints or symptoms. Cardiovascular The patient has no complaints or symptoms. Psychiatric The patient has no complaints or symptoms. Objective Constitutional Well-nourished and well-hydrated in no acute distress. Vitals Time Taken: 11:21 AM, Height: 69 in, Weight: 240 lbs, BMI: 35.4, Temperature: 98.0 F, Pulse: 99 bpm, Respiratory Rate: 16 breaths/min, Blood Pressure: 107/71 mmHg. Respiratory normal breathing without difficulty. Psychiatric this patient is able to make decisions and demonstrates good insight into disease process. Alert and Oriented x 3.  pleasant and cooperative. General Notes: On evaluation today patient's wound bed currently shows evidence of having reopened there was some callous and Slough noted which required sharp debridement at this point. She tolerated this without complication. Fortunately there's no signs of infection at this time. Integumentary (Hair, Skin) Wound #5 status is Open. Original cause of wound was Gradually Appeared. The  wound is located on the Left Toe Great. The wound measures 1cm length x 0.5cm width x 0.4cm depth; 0.393cm^2 area and 0.157cm^3 volume. There is Fat Layer (Subcutaneous Tissue) Exposed exposed. There is no tunneling or undermining noted. There is a small amount of serous drainage noted. There is no granulation within the wound bed. There is no necrotic tissue within the wound bed. The periwound skin appearance exhibited: Maceration. The periwound skin appearance did not exhibit: Callus, Crepitus, Ishler, Jaquanda A. ([id number]) Excoriation, Induration, Rash, Scarring, Dry/Scaly, Atrophie Blanche, Cyanosis, Ecchymosis, Hemosiderin Staining, Mottled, Pallor, Rubor, Erythema. 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 There was a Excisional Skin/Subcutaneous Tissue Debridement with a total area of 0.5 sq cm performed by STONE III, HOYT E., PA-C. With the following instrument(s): Curette to remove Viable and Non-Viable tissue/material. Material removed includes Callus, Subcutaneous Tissue, and Slough after achieving pain control using Lidocaine 4% Topical Solution. No specimens were taken. A time out was conducted at 12:13, prior to the start of the procedure. A Minimum  amount of bleeding was controlled with Pressure. The procedure was tolerated well with a pain level of 0 throughout and a pain level of 0 following the procedure. Post Debridement Measurements: 1cm length x 0.5cm width x 0.6cm depth; 0.236cm^3 volume. Character of Wound/Ulcer Post Debridement is improved. Severity of Tissue Post Debridement is: Fat layer exposed. Post procedure Diagnosis Wound #5: Same as Pre-Procedure Plan Wound Cleansing: Wound #5 Left Toe Great: Clean wound with Normal Saline. Anesthetic (add to Medication List): Wound #5 Left Toe Great: Topical Lidocaine 4% cream applied to wound bed prior to debridement (In Clinic Only). Primary Wound Dressing: Wound #5 Left Toe Great: Silver Alginate Secondary Dressing: Wound #5 Left Toe Great: Conform/Kerlix Foam Dressing Change Frequency: Wound #5 Left Toe Great: Change dressing every day. Follow-up Appointments: ANABETH, CHILCOTT ([id number]) Wound #5 Left Toe Great: Return Appointment in 1 week. Off-Loading: Wound #5 Left Toe Great: Open toe surgical shoe with peg assist. - left foot My suggestion at this point is gonna be that we go ahead and initiate the above wound care measures for the next week. We will subsequently see were things stand at follow-up. In the meantime she needs to try to stay off this as much as possible. Patient states that she understands. Please see above for specific wound care orders. We will see patient for re-evaluation in 1 week(s) here in the clinic. If anything worsens or changes patient will contact our office for additional recommendations. Electronic Signature(s) Signed: 11/23/2018 5:12:31 PM By: Worthy Keeler PA-C Entered By: Worthy Keeler on 11/23/2018 14:18:06 [name] ([id number]) -------------------------------------------------------------------------------- ROS/PFSH Details Patient Name: Heather Dear A. Date of Service: 11/23/2018 11:00 AM Medical Record Number:  [id number] Patient Account Number: [id number] Date of Birth/Sex: October 24, 1976 (43 y.o. F) Treating RN: Montey Hora Primary Care Provider: Priscille Kluver Other Clinician: Referring Provider: Priscille Kluver Treating Provider/Extender: STONE III, HOYT Weeks in Treatment: 15 Information Obtained From Patient Wound History Do you currently have one or more open woundso Yes How many open wounds do you currently haveo 1 Constitutional Symptoms (General Health) Complaints and Symptoms: Negative for: Fever; Chills Eyes Medical History: Positive for: Cataracts; Glaucoma;  Optic Neuritis Ear/Nose/Mouth/Throat Medical History: Positive for: Chronic sinus problems/congestion; Middle ear problems Respiratory Complaints and Symptoms: No Complaints or Symptoms Cardiovascular Complaints and Symptoms: No Complaints or Symptoms Endocrine Medical History: Positive for: Type II Diabetes Time with diabetes: 10 years Treated with: Insulin, Oral agents Blood sugar tested every day: No Blood sugar testing results: Lunch: 350 Psychiatric Complaints and Symptoms: No Complaints or Symptoms HBO Extended History Items Salceda, Abryana A. ([id number]) Eyes: Eyes: Ear/Nose/Mouth/Throat: Ear/Nose/Mouth/Throat: Cataracts Glaucoma Chronic sinus Middle ear problems problems/congestion Immunizations Pneumococcal Vaccine: Received Pneumococcal Vaccination: No Implantable Devices Family and Social History Current every day smoker Physician Affirmation I have reviewed and agree with the above information. Electronic Signature(s) Signed: 11/23/2018 5:12:31 PM By: Worthy Keeler PA-C Signed: 11/23/2018 5:16:27 PM By: Montey Hora Entered By: Worthy Keeler on 11/23/2018 14:16:29 Moroni, Porshea AMarland [name] ([id number]) -------------------------------------------------------------------------------- SuperBill Details Patient Name: Heather Dear A. Date of Service: 11/23/2018 Medical Record Number:  [id number] Patient Account Number: [id number] Date of Birth/Sex: [birth year] (43 y.o. F) Treating RN: Montey Hora Primary Care Provider: Priscille Kluver Other Clinician: Referring Provider: Priscille Kluver Treating Provider/Extender: Melburn Hake, HOYT Weeks in Treatment: 15 Diagnosis Coding ICD-10 Codes Code Description E11.621 Type 2 diabetes mellitus with foot ulcer L97.522 Non-pressure chronic ulcer of other part of left foot with fat layer exposed M21.372 Foot drop, left foot Facility Procedures CPT4 Code: [id number] Description: 95747 - DEB SUBQ TISSUE 20 SQ CM/< ICD-10 Diagnosis Description L97.522 Non-pressure chronic ulcer of other part of left foot with fat Modifier: layer exposed Quantity: 1 Physician Procedures CPT4 Code: 3403709 Description: 64383 - WC PHYS SUBQ TISS 20 SQ CM ICD-10 Diagnosis Description L97.522 Non-pressure chronic ulcer of other part of left foot with fat Modifier: layer exposed Quantity: 1 Electronic Signature(s) Signed: 11/23/2018 5:12:31 PM By: Worthy Keeler PA-C Entered By: Worthy Keeler on 11/23/2018 14:18:13

## 2018-11-25 NOTE — Progress Notes (Signed)
[name] ([id number]) Visit Report for 11/23/2018 Arrival Information Details Patient Name: [name], [name] Date of Service: 11/23/2018 11:00 AM Medical Record Number: [id number] Patient Account Number: [id number] Date of Birth/Sex: July 02, 1976 (44 y.o. F) Treating RN: Secundino Ginger Primary Care Georgia Baria: Priscille Kluver Other Clinician: Referring Annye Forrey: Priscille Kluver Treating Makana Rostad/Extender: Melburn Hake, HOYT Weeks in Treatment: 15 Visit Information History Since Last Visit Added or deleted any medications: No Patient Arrived: Wheel Chair Any new allergies or adverse reactions: No Arrival Time: 11:18 Had a fall or experienced change in No activities of daily living that may affect Accompanied By: mother risk of falls: Transfer Assistance: None Signs or symptoms of abuse/neglect since last visito No Patient Identification Verified: Yes Hospitalized since last visit: No Secondary Verification Process Completed: Yes Implantable device outside of the clinic excluding No Patient Requires Transmission-Based No cellular tissue based products placed in the center Precautions: since last visit: Patient Has Alerts: No Has Dressing in Place as Prescribed: Yes Pain Present Now: No Electronic Signature(s) Signed: 11/23/2018 11:55:35 AM By: Secundino Ginger Entered By: Secundino Ginger on 11/23/2018 11:19:21 [name] ([id number]) -------------------------------------------------------------------------------- Encounter Discharge Information Details Patient Name: Heather Dear A. Date of Service: 11/23/2018 11:00 AM Medical Record Number: [id number] Patient Account Number: [id number] Date of Birth/Sex: 27-Jan-1976 (43 y.o. F) Treating RN: Montey Hora Primary Care Diala Waxman: Priscille Kluver Other Clinician: Referring Vale Peraza: Priscille Kluver Treating Gabriel Conry/Extender: Melburn Hake, HOYT Weeks in Treatment: 15 Encounter Discharge Information Items Post Procedure Vitals Discharge Condition:  Stable Temperature (F): 98.0 Ambulatory Status: Wheelchair Pulse (bpm): 99 Discharge Destination: Home Respiratory Rate (breaths/min): 16 Transportation: Private Auto Blood Pressure (mmHg): 107/71 Accompanied By: mother Schedule Follow-up Appointment: Yes Clinical Summary of Care: Electronic Signature(s) Signed: 11/23/2018 5:16:27 PM By: Montey Hora Entered By: Montey Hora on 11/23/2018 12:23:48 [name] ([id number]) -------------------------------------------------------------------------------- Lower Extremity Assessment Details Patient Name: Heather Dear A. Date of Service: 11/23/2018 11:00 AM Medical Record Number: [id number] Patient Account Number: [id number] Date of Birth/Sex: 1976-09-12 (43 y.o. F) Treating RN: Secundino Ginger Primary Care Berel Najjar: Priscille Kluver Other Clinician: Referring Jaliana Medellin: Priscille Kluver Treating Cyntia /Extender: STONE III, HOYT Weeks in Treatment: 15 Edema Assessment Assessed: [Left: No] [Right: No] Edema: [Left: N] [Right: o] Calf Left: Right: Point of Measurement: 25 cm From Medial Instep 27 cm cm Ankle Left: Right: Point of Measurement: 12 cm From Medial Instep 20.5 cm cm Vascular Assessment Claudication: Claudication Assessment [Left:None] Pulses: Posterior Tibial Extremity colors, hair growth, and conditions: Extremity Color: [Left:Normal] Hair Growth on Extremity: [Left:Yes] Temperature of Extremity: [Left:Warm] Capillary Refill: [Left:< 3 seconds] Toe Nail Assessment Left: Right: Thick: No Discolored: No Deformed: No Improper Length and Hygiene: No Electronic Signature(s) Signed: 11/23/2018 11:55:35 AM By: Secundino Ginger Entered By: Secundino Ginger on 11/23/2018 11:29:20 T[place]ley, Calais A. ([id number]) -------------------------------------------------------------------------------- Multi Wound Chart Details Patient Name: Heather Dear A. Date of Service: 11/23/2018 11:00 AM Medical Record Number: [id number] Patient Account  Number: [id number] Date of Birth/Sex: 08/09/1976 (43 y.o. F) Treating RN: Montey Hora Primary Care Malakye Nolden: Priscille Kluver Other Clinician: Referring Aimie Wagman: Priscille Kluver Treating Kima Malenfant/Extender: STONE III, HOYT Weeks in Treatment: 15 Vital Signs Height(in): 69 Pulse(bpm): 99 Weight(lbs): 240 Blood Pressure(mmHg): 107/71 Body Mass Index(BMI): 35 Temperature(F): 98.0 Respiratory Rate 16 (breaths/min): Photos: [N/A:N/A] Wound Location: Left Toe Great N/A N/A Wounding Event: Gradually Appeared N/A N/A Primary Etiology: Diabetic Wound/Ulcer of the N/A N/A Lower Extremity Comorbid History: Cataracts, Glaucoma, Optic N/A N/A Neuritis, Chronic sinus problems/congestion, Middle ear problems, Type II Diabetes Date Acquired: 11/16/2018 N/A N/A  Weeks of Treatment: 0 N/A N/A Wound Status: Open N/A N/A Measurements L x W x D 1x0.5x0.4 N/A N/A (cm) Area (cm) : 0.393 N/A N/A Volume (cm) : 0.157 N/A N/A Classification: Grade 3 N/A N/A Exudate Amount: Small N/A N/A Exudate Type: Serous N/A N/A Exudate Color: amber N/A N/A Granulation Amount: None Present (0%) N/A N/A Necrotic Amount: None Present (0%) N/A N/A Exposed Structures: Fat Layer (Subcutaneous N/A N/A Tissue) Exposed: Yes Fascia: No Tendon: No Muscle: No Joint: No Bone: No Periwound Skin Texture: N/A N/A Gersten, Damariz A. ([id number]) Excoriation: No Induration: No Callus: No Crepitus: No Rash: No Scarring: No Periwound Skin Moisture: Maceration: Yes N/A N/A Dry/Scaly: No Periwound Skin Color: Atrophie Blanche: No N/A N/A Cyanosis: No Ecchymosis: No Erythema: No Hemosiderin Staining: No Mottled: No Pallor: No Rubor: No Tenderness on Palpation: No N/A N/A Wound Preparation: Ulcer Cleansing: N/A N/A Rinsed/Irrigated with Saline Topical Anesthetic Applied: Other: lidocaine 4% Treatment Notes Electronic Signature(s) Signed: 11/23/2018 5:16:27 PM By: Montey Hora Entered By: Montey Hora on  11/23/2018 12:13:49 [name] ([id number]) -------------------------------------------------------------------------------- [place] Details Patient Name: Heather Dear A. Date of Service: 11/23/2018 11:00 AM Medical Record Number: [id number] Patient Account Number: [id number] Date of Birth/Sex: 01/31/1976 (43 y.o. F) Treating RN: Montey Hora Primary Care Ronaldo Crilly: Priscille Kluver Other Clinician: Referring Dionne Knoop: Priscille Kluver Treating Levan Aloia/Extender: Melburn Hake, HOYT Weeks in Treatment: 15 Active Inactive Abuse / Safety / Falls / Self Care Management Nursing Diagnoses: History of Falls Goals: Patient will not experience any injury related to falls Date Initiated: 08/07/2018 Target Resolution Date: 10/19/2018 Goal Status: Active Interventions: Assess fall risk on admission and as needed Notes: Orientation to the Wound Care Program Nursing Diagnoses: Knowledge deficit related to the wound healing center program Goals: Patient/caregiver will verbalize understanding of the [place] Program Date Initiated: 08/07/2018 Target Resolution Date: 10/19/2018 Goal Status: Active Interventions: Provide education on orientation to the wound center Notes: Soft Tissue Infection Nursing Diagnoses: Impaired tissue integrity Goals: Patient will remain free of wound infection Date Initiated: 08/07/2018 Target Resolution Date: 10/19/2018 Goal Status: Active Interventions: Assess signs and symptoms of infection every visit Even, Alizia A. ([id number]) Notes: Wound/Skin Impairment Nursing Diagnoses: Impaired tissue integrity Goals: Ulcer/skin breakdown will heal within 14 weeks Date Initiated: 08/07/2018 Target Resolution Date: 10/19/2018 Goal Status: Active Interventions: Assess patient/caregiver ability to obtain necessary supplies Notes: Electronic Signature(s) Signed: 11/23/2018 5:16:27 PM By: Montey Hora Entered By: Montey Hora on 11/23/2018 12:13:42 [name] ([id number]) -------------------------------------------------------------------------------- Pain Assessment Details Patient Name: Heather Dear A. Date of Service: 11/23/2018 11:00 AM Medical Record Number: [id number] Patient Account Number: [id number] Date of Birth/Sex: Sep 24, 1976 (43 y.o. F) Treating RN: Secundino Ginger Primary Care Yessika Otte: Priscille Kluver Other Clinician: Referring Alfie Alderfer: Priscille Kluver Treating Gianno Volner/Extender: STONE III, HOYT Weeks in Treatment: 15 Active Problems Location of Pain Severity and Description of Pain Patient Has Paino No Site Locations Pain Management and Medication Current Pain Management: Notes pt denies any pain at this time Electronic Signature(s) Signed: 11/23/2018 11:55:35 AM By: Secundino Ginger Entered By: Secundino Ginger on 11/23/2018 11:21:16 [name] ([id number]) -------------------------------------------------------------------------------- Patient/Caregiver Education Details Patient Name: Heather Dear A. Date of Service: 11/23/2018 11:00 AM Medical Record Number: [id number] Patient Account Number: [id number] Date of Birth/Gender: June 15, 1976 (43 y.o. F) Treating RN: Montey Hora Primary Care Physician: Priscille Kluver Other Clinician: Referring Physician: Priscille Kluver Treating Physician/Extender: Sharalyn Ink in Treatment: 15 Education Assessment Education Provided To: Patient and Caregiver Education Topics Provided Offloading: Handouts:  Other: continue wearing your offloading shoe Methods: Explain/Verbal Responses: State content correctly Wound/Skin Impairment: Handouts: Other: wound care as ordered Methods: Demonstration, Explain/Verbal Responses: State content correctly Electronic Signature(s) Signed: 11/23/2018 5:16:27 PM By: Montey Hora Entered By: Montey Hora on 11/23/2018 12:24:25 [name]  ([id number]) -------------------------------------------------------------------------------- Wound Assessment Details Patient Name: Heather Dear A. Date of Service: 11/23/2018 11:00 AM Medical Record Number: [id number] Patient Account Number: [id number] Date of Birth/Sex: 1976-04-28 (43 y.o. F) Treating RN: Secundino Ginger Primary Care Kayleana Waites: Priscille Kluver Other Clinician: Referring Mekhi Sonn: Priscille Kluver Treating March Steyer/Extender: STONE III, HOYT Weeks in Treatment: 15 Wound Status Wound Number: 5 Primary Diabetic Wound/Ulcer of the Lower Extremity Etiology: Wound Location: Left Toe Great Wound Open Wounding Event: Gradually Appeared Status: Date Acquired: 11/16/2018 Comorbid Cataracts, Glaucoma, Optic Neuritis, Chronic Weeks Of Treatment: 0 History: sinus problems/congestion, Middle ear problems, Clustered Wound: No Type II Diabetes Photos Photo Uploaded By: Secundino Ginger on 11/23/2018 11:37:02 Wound Measurements Length: (cm) 1 % Reductio Width: (cm) 0.5 % Reductio Depth: (cm) 0.4 Tunneling: Area: (cm) 0.393 Undermini Volume: (cm) 0.157 n in Area: n in Volume: No ng: No Wound Description Classification: Grade 3 Exudate Amount: Small Exudate Type: Serous Exudate Color: amber Wound Bed Granulation Amount: None Present (0%) Exposed Structure Necrotic Amount: None Present (0%) Fascia Exposed: No Fat Layer (Subcutaneous Tissue) Exposed: Yes Tendon Exposed: No Muscle Exposed: No Joint Exposed: No Bone Exposed: No Periwound Skin Texture Texture Color Mcdougle, Wrigley A. ([id number]) No Abnormalities Noted: No No Abnormalities Noted: No Callus: No Atrophie Blanche: No Crepitus: No Cyanosis: No Excoriation: No Ecchymosis: No Induration: No Erythema: No Rash: No Hemosiderin Staining: No Scarring: No Mottled: No Pallor: No Moisture Rubor: No No Abnormalities Noted: No Dry / Scaly: No Maceration: Yes Wound Preparation Ulcer Cleansing: Rinsed/Irrigated with  Saline Topical Anesthetic Applied: Other: lidocaine 4%, Treatment Notes Wound #5 (Left Toe Great) Notes silvercel, foam, conform tape Electronic Signature(s) Signed: 11/23/2018 11:55:35 AM By: Secundino Ginger Entered By: Secundino Ginger on 11/23/2018 11:27:44 [name] ([id number]) -------------------------------------------------------------------------------- Vitals Details Patient Name: Heather Dear A. Date of Service: 11/23/2018 11:00 AM Medical Record Number: [id number] Patient Account Number: [id number] Date of Birth/Sex: November 22, 1975 (43 y.o. F) Treating RN: Secundino Ginger Primary Care Tashika Goodin: Priscille Kluver Other Clinician: Referring Myan Suit: Priscille Kluver Treating Aiman Sonn/Extender: STONE III, HOYT Weeks in Treatment: 15 Vital Signs Time Taken: 11:21 Temperature (F): 98.0 Height (in): 69 Pulse (bpm): 99 Weight (lbs): 240 Respiratory Rate (breaths/min): 16 Body Mass Index (BMI): 35.4 Blood Pressure (mmHg): 107/71 Reference Range: 80 - 120 mg / dl Airway Electronic Signature(s) Signed: 11/23/2018 11:55:35 AM By: Secundino Ginger Entered BySecundino Ginger on 11/23/2018 11:22:08

## 2018-11-30 ENCOUNTER — Encounter: Payer: Medicaid Other | Attending: Physician Assistant | Admitting: Physician Assistant

## 2018-11-30 DIAGNOSIS — F17210 Nicotine dependence, cigarettes, uncomplicated: Secondary | ICD-10-CM | POA: Diagnosis not present

## 2018-11-30 DIAGNOSIS — L97522 Non-pressure chronic ulcer of other part of left foot with fat layer exposed: Secondary | ICD-10-CM | POA: Insufficient documentation

## 2018-11-30 DIAGNOSIS — E11621 Type 2 diabetes mellitus with foot ulcer: Secondary | ICD-10-CM | POA: Diagnosis not present

## 2018-11-30 DIAGNOSIS — L97519 Non-pressure chronic ulcer of other part of right foot with unspecified severity: Secondary | ICD-10-CM | POA: Diagnosis not present

## 2018-11-30 DIAGNOSIS — H409 Unspecified glaucoma: Secondary | ICD-10-CM | POA: Insufficient documentation

## 2018-11-30 DIAGNOSIS — M21372 Foot drop, left foot: Secondary | ICD-10-CM | POA: Insufficient documentation

## 2018-11-30 DIAGNOSIS — Z881 Allergy status to other antibiotic agents status: Secondary | ICD-10-CM | POA: Insufficient documentation

## 2018-12-02 NOTE — Progress Notes (Signed)
KRIS, BURD ([id number]) Visit Report for 11/30/2018 Chief Complaint Document Details Patient Name: [name], [name] Date of Service: 11/30/2018 11:00 AM Medical Record Number: [id number] Patient Account Number: [id number] Date of Birth/Sex: Aug 04, 1976 (43 y.o. F) Treating [name]: Montey Hora Primary Care Provider: Priscille Kluver Other Clinician: Referring Provider: Priscille Kluver Treating Provider/Extender: Melburn Hake, HOYT Weeks in Treatment: 16 Information Obtained from: Patient Chief Complaint Left great toe ulcer Electronic Signature(s) Signed: 12/02/2018 3:37:27 PM By: Worthy Keeler PA-C Entered By: Worthy Keeler on 11/30/2018 10:48:19 [name] ([id number]) -------------------------------------------------------------------------------- Debridement Details Patient Name: Heather Dear A. Date of Service: 11/30/2018 11:00 AM Medical Record Number: [id number] Patient Account Number: [id number] Date of Birth/Sex: 04-16-1976 (43 y.o. F) Treating [name]: Cornell Barman Primary Care Provider: Priscille Kluver Other Clinician: Referring Provider: Priscille Kluver Treating Provider/Extender: Melburn Hake, HOYT Weeks in Treatment: 16 Debridement Performed for Wound #5 Left Toe Great Assessment: Performed By: Physician STONE III, HOYT E., PA-C Debridement Type: Debridement Severity of Tissue Pre Fat layer exposed Debridement: Level of Consciousness (Pre- Awake and Alert procedure): Pre-procedure Verification/Time Yes - 11:30 Out Taken: Start Time: 11:30 Pain Control: Lidocaine Total Area Debrided (L x W): 0.6 (cm) x 0.5 (cm) = 0.3 (cm) Tissue and other material Viable, Non-Viable, Callus, Slough, Subcutaneous, Slough debrided: Level: Skin/Subcutaneous Tissue Debridement Description: Excisional Instrument: Curette Bleeding: Minimum Hemostasis Achieved: Pressure End Time: 11:33 Response to Treatment: Procedure was tolerated well Level of Consciousness Awake and  Alert (Post-procedure): Post Debridement Measurements of Total Wound Length: (cm) 0.6 Width: (cm) 0.3 Depth: (cm) 0.4 Volume: (cm) 0.057 Character of Wound/Ulcer Post Debridement: Stable Severity of Tissue Post Debridement: Fat layer exposed Post Procedure Diagnosis Same as Pre-procedure Electronic Signature(s) Signed: 11/30/2018 5:32:15 PM By: [name], BSN Signed: 12/02/2018 3:37:27 PM By: Worthy Keeler PA-C Entered By: Heather Cool, BSN, [name], CWS, [name] on 11/30/2018 17:32:15 Seabrooks, Graci A. ([id number]) -------------------------------------------------------------------------------- HPI Details Patient Name: Heather Dear A. Date of Service: 11/30/2018 11:00 AM Medical Record Number: [id number] Patient Account Number: [id number] Date of Birth/Sex: Mar 01, 1976 (43 y.o. F) Treating [name]: Montey Hora Primary Care Provider: Priscille Kluver Other Clinician: Referring Provider: Priscille Kluver Treating Provider/Extender: Melburn Hake, HOYT Weeks in Treatment: 16 History of Present Illness HPI Description: 01/18/18-She is here for initial evaluation of the left great toe ulcer. She is a poor historian in regards to timeframe in detail. She states approximately 4 weeks ago she lacerated her toe on something in the house. She followed up with her primary care who placed her on Bactrim and ultimately a second dose of Bactrim prior to coming to wound clinic. She states she has been treating the toe with peroxide, Betadine and a Band-Aid. She did not check her blood sugar this morning but checked it yesterday morning it was 327; she is unaware of a recent A1c and there are no current records. She saw Dr. she would've orthopedics last week for an old injury to the left ankle, she states he did not see her toe, nor did she bring it to his attention. She smokes approximately 1 pack cigarettes a day. Her social situation is concerning, she arrives this morning with her mother who appears  extremely intoxicated/under the influence; her mother was asked to leave the room and be monitored by the patient's grandmother. The patient's aunt then accompanied the patient and the room throughout the rest of the appointment. We had a lengthy discussion regarding the deleterious effects of uncontrolled hyperglycemia and smoking as it  relates to wound healing and overall health. She was strongly encouraged to decrease her smoking and get her diabetes under better control. She states she is currently on a diet and has cut down her [facility] consumption. The left toe is erythematous, macerated and slightly edematous with malodor present. The edema in her left foot is below her baseline, there is no erythema streaking. We will treat her with Santyl, doxycycline; we have ordered and xray, culture and provided a Peg assist surgical shoe and cultured the wound. 01/25/18-She is here in follow-up evaluation for a left great toe ulcer and presents with an abscess to her suprapubic area. She states her blood sugars remain elevated, feeling "sick" and if levels are below 250, but she is trying. She has made no attempt to decrease her smoking stating that we "can't take away her food in her cigarettes". She has been compliant with offloading using the PEG assist you. She is using Santyl daily. the culture obtained last week grew staph aureus and Enterococcus faecalis; continues on the doxycycline and Augmentin was added on Monday. The suprapubic area has erythema, no femoral variation, purple discoloration, minimal induration, was accessed with a cotton tip applicator with sanguinopurulent drainage, this was cultured, I suspect the current antibiotic treatment will cover and we will not add anything to her current treatment plan. She was advised to go to urgent care or ER with any change in redness, induration or fever. 02/01/18-She is here in follow-up evaluation for left great toe ulcers and a new abdominal  abscess from last week. She was able to use packing until earlier this week, where she "forgot it was there". She states she was feeling ill with GI symptoms last week and was not taking her antibiotic. She states her glucose levels have been predominantly less than 200, with occasional levels between 200-250. She thinks this was contributing to her GI symptoms as they have resolved without intervention. There continues to be significant laceration to left toe, otherwise it clinically looks stable/improved. There is now less superficial opening to the lateral aspect of the great toe that was residual blister. We will transition to [facility] to all wounds, she will continue her Augmentin. If there is no change or deterioration next week for reculture. 02/08/18-She is here in follow-up evaluation for left great toe ulcer and abdominal ulcer. There is an improvement in both wounds. She has been wrapping her left toe with coban, not by our direction, which has created an area of discoloration to the medial aspect; she has been advised to NOT use coban secondary to her neuropathy. She states her glucose levels have been high over this last week ranging from 200-350, she continues to smoke. She admits to being less compliant with her offloading shoe. We will continue with same treatment plan and she will follow-up next week. 02/15/18-She is here in follow-up evaluation for left great toe ulcer and abdominal ulcer. The abdominal ulcer is epithelialized. The left great toe ulcer is improved and all injury from last week using the Coban wrap is resolved, the lateral ulcer is healed. She admits to noncompliance with wearing offloading shoe and admits to glucose levels being greater than 300 most of the week. She continues to smoke and expresses no desire to quit. There is one area medially that probes deeper than it has historically, erythema to the toe and dorsal foot has consistently waxed and waned.  There is no overt signs of cellulitis or infection but we will culture  the wound for any occult infection given the new area of depth and erythema. We will hold off on sensitivities for initiation of antibiotic therapy. 02/22/18-She is here in follow up evaluation for left great toe ulcer. There is overall significant improvement in both wound appearance, erythema and edema with changes made last week. She was not initiated on antibiotic therapy. Culture obtained last week showed oxacillin sensitive staph aureus, sensitive to clindamycin. Clindamycin has been called into the pharmacy but she has been instructed to hold off on initiation secondary to overall clinical improvement and her history of antibiotic intolerance. She has been instructed to contact the clinic with any noted changes/deterioration and the wound, erythema, Kerrick, Margaurite A. ([id number]) edema and/or pain. She will follow-up next week. She continues to smoke and her glucose levels remain elevated >250; she admits to compliance with offloading shoe 03/01/18 on evaluation today patient appears to be doing fairly well in regard to her left first toe ulcer. She has been tolerating the dressing changes with the [facility] Dressing without complication and overall this has definitely showed signs of improvement according to records as well is what the patient tells me today. I'm very pleased in that regard. She is having no pain today 03/08/18 She is here for follow up evaluation of a left great toe ulcer. She remains non-compliant with glucose control and smoking cessation; glucose levels consistently >200. She states that she got new shoe inserts/peg assist. She admits to compliance with offloading. Since my last evaluation there is significant improvement. We will switch to prisma at this time and she will follow up next week. She is noted to be tachycardic at this appointment, heart rate 120s; she has a history of heart rate 70-130  according to our records. She admits to extreme agitation r/t personal issues; she was advised to monitor her heartrate and contact her physician if it does not return to a more normal range (<100). She takes cardizem twice daily. 03/15/18-She is here in follow-up evaluation for left great toe ulcer. She remains noncompliant with glucose control and smoking cessation. She admits to compliance with wearing offloading shoe. The ulcer is improved/stable and we will continue with the same treatment plan and she will follow-up next week 03/22/18-She is here for evaluation for left great toe ulcer. There continues to be significant improvement despite recurrent hyperglycemia (over 500 yesterday) and she continues to smoke. She has been compliant with offloading and we will continue with same treatment plan and she will follow-up next week. 03/29/18-She is here for evaluation for left great toe ulcer. Despite continuing to smoke and uncontrolled diabetes she continues to improve. She is compliant with offloading shoe. We will continue with the same treatment plan and she will follow-up next week 04/05/18- She is here in follow up evaluation for a left great toe ulcer; she presents with small pustule to left fifth toe (resembles ant bite). She admits to compliance with wearing offloading shoe; continues to smoke or have uncontrolled blood glucose control. There is more callus than usual with evidence of bleeding; she denies known trauma. 04/12/18-She is here for evaluation of left great toe ulcer. Despite noncompliance with glycemic control and smoking she continues to make improvement. She continues to wear offloading shoe. The pustule, that was identified last week, to the left fifth toe is resolved. She will follow-up in 2 weeks 05/03/18-she is seen in follow-up evaluation for a left great toe ulcer. She is compliant with offloading, otherwise noncompliant with glycemic control  and smoking. She has plateaued  and there is minimal improvement noted. We will transition to [facility], replaced the insert to her surgical shoe and she will follow-up in one week 05/10/18- She is here in follow up evaluation for a left great toe ulcer. It appears stable despite measurement change. We will continue with same treatment plan and follow up next week. 05/24/18-She is seen in follow-up evaluation for a left great toe ulcer. She remains compliant with offloading, has made significant improvement in her diet, decreasing the amount of sugar/soda. She said her recent A1c was 10.9 which is lower than. She did see a diabetic nutritionist/educator yesterday. She continues to smoke. We will continue with the same treatment plan and she'll follow-up next week. 05/31/18- She is seen in follow-up evaluation for left great toe ulcer. She continues to remain compliant with offloading, continues to make improvement in her diet, increasing her water and decreasing the amount of sugar/soda. She does continue to smoke with no desire to quit. We will apply Prisma to the depth and Hydrofera Blue over. We have not received insurance authorization for oasis. She will follow up next week. 06/07/18-She is seen in follow-up evaluation for left great toe ulcer. It has stalled according to today's measurements although base appears stable. She says she saw a diabetic educator yesterday; her average blood sugars are less than 300 which is an improvement for her. She continues to smoke and states "that's my next step" She continues with water over soda. We will order for xray, culture and reinstate ace wrap compression prior to placing apligraf for next week. She is voicing no complaints or concerns. Her dressing will change to iodoflex over the next week in preparation for apligraf. 06/14/18-She is seen in follow-up evaluation for left great toe ulcer. Plain film x-ray performed last week was negative for osteomyelitis. Wound culture obtained  last week grew strep B and OSSA; she is initiated on keflex and cefdinir today; there is erythema to the toe which could be from ace wrap compression, she has a history of wrapping too tight and has has been encouraged to maintain ace wraps that we place today. We will hold off on application of apligraf today, will apply next week after antibiotic therapy has been initiated. She admits today that she has resumed taking a shower with her foot/toe submerged in water, she has been reminded to keep foot/toe out of the bath water. She will be seen in follow up next week 06/21/18-she is seen in follow-up evaluation for left great toe ulcer. She is tolerating antibiotic therapy with no GI disturbance. The wound is stable. Apligraf was applied today. She has been decreasing her smoking, only had 4 cigarettes yesterday and 1 today. She continues being more compliant in diabetic diet. She will follow-up next week for evaluation of site, if stable will remove at 2 weeks. 06/28/18- She is here in follow up evalution. Apligraf was placed last week, she states the dressing fell off on Tuesday and she was dressing with hydrofera blue. She is healed and will be discharged from the clinic today. She has been instructed to continue with smoking cessation, continue monitoring glucose levels, offloading for an additional 4 weeks and continue with Arpin, Gerrianne A. ([id number]) hydrofera blue for additional two weeks for any possible microscopic opening. Readmission: 08/07/18 on evaluation today patient presents for reevaluation concerning the ulcer of her right great toe. She was previously discharged on 06/28/18 healed. Nonetheless she states that this began to  show signs of drainage she subsequently went to her primary care provider. Subsequently an x-ray was performed on 08/01/18 which was negative. The patient was also placed on antibiotics at that time. Fortunately they should have been effective for the infection.  Nonetheless she's been experiencing some improvement but still has a lot of drainage coming from the wound itself. 08/14/18 on evaluation today patient's wound actually does show signs of improvement in regard to the erythema at this point. She has completed the antibiotics. With that being said we did discuss the possibility of placing her in a total contact cast as of today although I think that I may want to give this just a little bit more time to ensure nothing recurrence as far as her infection is concerned. I do not want to put in the cast and risk infection at that time if things are not completely resolved. With that being said she is gonna require some debridement today. 08/21/18 on evaluation today patient actually appears to be doing okay in regard to her toe ulcer. She's been tolerating the dressing changes without complication. With that being said it does appear that she is ready and in fact I think it's appropriate for [place] to go ahead and initiate the total contact cast today. Nonetheless she will require some sharp debridement to prepare the wound for application. Overall I feel like things have been progressing well but we do need to do something to get this to close more readily. 08/24/18 patient seen today for reevaluation after having had the total contact cast applied on Tuesday. She seems to have done very well the wound appears to be doing great and overall I'm pleased with the progress that she's made. There were no abnormal areas of rubbing from the cast on her lower extremity. 08/30/18 on evaluation today patient actually appears to be completely healed in regard to her plantar toe ulcer. She tells me at this point she's been having a lot of issues with the cast. She almost fell a couple of times the state shall the step of her dog a couple times as well. This is been a very frustrating process for her other nonetheless she has completely healed the wound which is excellent  news. Overall there does not appear to be the evidence of infection at this time which is great news. 09/11/18 evaluation today patient presents for follow-up concerning her great toe ulcer on the left which has unfortunately reopened since I last saw her which was only a couple of weeks ago. Unfortunately she was not able to get in to get the shoe and potentially the AFO that's gonna be necessary due to her left foot drop. She continues with offloading shoe but this is not enough to prevent her from reopening it appears. When we last had her in the total contact cast she did well from a healing standpoint but unfortunately the wound reopened as soon as she came out of the cast within just a couple of weeks. 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 This is also part of the reason why she falls on a regular basis. I just do not believe that is gonna be safe for [place] to reinitiate the total contact cast as last time we had this on  she fell 3 times one week which is definitely not normal for her. 09/18/18 upon evaluation today the patient actually appears to be doing about the same in regard to her toe ulcer. She did not contact [employer] as I asked her to even though I had given her the prescription. In fact she actually states that she has no idea where the prescription is. She did apparently call [employer] and they told her that all she needed to do was bring the prescription in order to be able to be seen and work on getting the AFO for her left foot. With all that being said she still does not have an appointment and I'm not sure were things stand that regard. I will give her a new prescription today in order to contact them to get this set up. 09/25/18 on evaluation today patient actually appears to be  doing about the same in regard to her toes ulcer. She does have a small areas which seems to have a lot of callous buildup around the edge of the wound which is going to need sharp debridement today. She still is waiting to be scheduled for evaluation with [employer] for possibility of an AFO. She states there supposed to call her tomorrow to get this set up. Unfortunately it does appear that her foot specifically the toe area is showing signs of erythema. There does not appear to be any systemic infection which is in these good news. 10/02/18 on evaluation today patient actually appears to be doing about the same in regard to her toe ulcer. This really has not done too well although it's not significantly larger it's also not significantly smaller. She has been tolerating the dressing changes without complication. She actually has her appointment with [employer] and [place] tomorrow to hopefully be measured for obtaining and AFO splint. I think this would be helpful preventing this from reoccurring. We had contemplated starting the cast this week although to be honest I am reluctant to do that as she's been having nausea, vomiting, and seizure activity over the past three days. She has a history of seizures and have been told is nothing that can be done for these. With Dede, Delmar A. ([id number]) that being said I do believe that along with the seizures have the nausea vomiting which upon further questioning doesn't seem to be the normal for her and makes me concerned for the possibility of infection or something else going on. I discussed this with the patient and her mother during the office visit today. I do not feel the wound is effective but maybe something else. The responses this was "this just happens to her at times and we don't know why". They did not seem to be interested in going to the hospital to have this checked out further. 10/09/18 on evaluation today patient presents for follow-up  concerning her ongoing toe ulcer. She has been tolerating the dressing changes without complication. Fortunately there does not appear to be any evidence of infection which is great news however I do think that the patient would benefit from going ahead for with the total contact cast. She's actually in a wheelchair today she tells me that she will use her walker if we initiate the cast. I was very specific about the fact that if we were gonna do the cast I wanted to make sure that she was using the walker in order to prevent any falls. She tells me she does not have stairs that she has to  traverse on a regular basis at her home. She has not had any seizures since last week again that something that happens to her often she tells me she did talk to [employer] and they said that it may take up to three weeks to get the brace approved for her. Hopefully that will not take that long but nonetheless in the meantime I do think the cast could be of benefit. 10/12/18 on evaluation today patient appears to be doing rather well in regard to her toe ulcer. It's just been a few days and already this is significantly improved both as far as overall appearance and size. Fortunately there's no sign of infection. She is here for her first obligatory cast change. 10/19/18 Seen today for follow up and management of left great toe ulcer. Wound continues to show improvement. Noted small open area with seroussang drainage with palpation. Denies any increased pain or recent fevers during visit. She will continue calcium alginate with offloading shoe. Denies any questions or concerns during visit. 10/26/18 on evaluation today patient appears to be doing about the same as when I last saw her in regard to her wound bed. Fortunately there does not appear to be any signs of infection. Unfortunately she continues to have a breakdown in regard to the toe region any time that she is not in the cast. It takes almost no time at all  for this to happen. Nonetheless she still has not heard anything from the brace being made by [employer] as to when exactly this will be available to her. Fortunately there is no signs of infection at this time. 10/30/18 on evaluation today patient presents for application of the total contact cast as we just received him this morning. Fortunately we are gonna be able to apply this to her today which is great news. She continues to have no significant pain which is good news. Overall I do feel like things have been improving while she was the cast is when she doesn't have a cast that things get worse. She still has not really heard anything from [place] regarding her brace. 11/02/18 upon evaluation today patient's wound already appears to be doing significantly better which is good news. Fortunately there does not appear to be any signs of infection also good news. Overall I do think the total contact cast as before is helping to heal this area unfortunately it's just not gonna likely keep the area closed and healed without her getting her brace at least. Again the foot drop is a significant issue for her. 11/09/18 on evaluation today patient appears to be doing excellent in regard to her toe ulcer which in fact is completely healed. Fortunately we finally got the situation squared away with the paperwork which was needed to proceed with getting her brace approved by Medicaid. I have filled that out unfortunately that information has been sent to the orthopedic office that I worked at 2 1/2 years ago and not tired Current wound care measures. Fortunately she seems to be doing very well at this time. 11/23/18 on evaluation today patient appears to be doing More Poorly Compared to Last Time I Saw Her. At [facility] She Had Completely Healed. Currently she is continuing to have issues with reopening. She states that she just found out that the brace was approved through Medicaid now she just has to go get  measured in order to have this fitted for her and then made. Subsequently she does not have an appointment for  this yet that is going to complicate things we obviously cannot put her back in the cast if we do not have everything measured because they're not gonna be able to measure her foot while she is in the cast. 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 Obviously this is unfortunate and does have me somewhat worried as well. 11/30/18 on evaluation today patient's toe ulcer actually appears to be doing fairly well. The good news is she will be getting her brace in the shoes next week on Wednesday. Hopefully we will be able to get this to heal without having to go back in the cast however she may need the cast in order to get the wound completely heal and then go from there. Fortunately there's no signs of infection at this time. Electronic Signature(s) [name], [name] ([id number]) Signed: 12/02/2018 3:37:27 PM By: Worthy Keeler PA-C Entered By: Worthy Keeler on 11/30/2018 12:25:50 [name] ([id number]) -------------------------------------------------------------------------------- Physical Exam Details Patient Name: Heather Dear A. Date of Service: 11/30/2018 11:00 AM Medical Record Number: [id number] Patient Account Number: [id number] Date of Birth/Sex: 1976/10/11 (44 y.o. F) Treating [name]: Montey Hora Primary Care Provider: Priscille Kluver Other Clinician: Referring Provider: Priscille Kluver Treating Provider/Extender: STONE III, HOYT Weeks in Treatment: 34 Constitutional Well-nourished and well-hydrated in no acute distress. Respiratory normal breathing without difficulty. Psychiatric this patient is able to make decisions and demonstrates good insight into disease process. Alert and Oriented x 3. pleasant and cooperative. Notes Patient's wound bed currently shows signs of  good granulation at this time. She appears to be doing rather well without any complication currently. Fortunately there is no evidence of infection she did have some Slough of the surface the wound she did have some callous as well that required sharp debridement this was performed without complication today post debridement the wound bed appears to be doing much better. Electronic Signature(s) Signed: 12/02/2018 3:37:27 PM By: Worthy Keeler PA-C Entered By: Worthy Keeler on 11/30/2018 85:63:14 [name] ([id number]) -------------------------------------------------------------------------------- Physician Orders Details Patient Name: Heather Dear A. Date of Service: 11/30/2018 11:00 AM Medical Record Number: [id number] Patient Account Number: [id number] Date of Birth/Sex: 1976-09-20 (43 y.o. 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 Anesthetic (add to Medication List) Wound #5 Left Toe Great o Topical Lidocaine 4% cream applied to wound bed prior to debridement (In Clinic Only). Primary Wound Dressing Wound #5 Left Toe Great o Silver Collagen Secondary Dressing Wound #5 Left Toe Great o Conform/Kerlix - foam Dressing Change Frequency Wound #5 Left Toe Great o Three times weekly Follow-up Appointments Wound #5 Left Toe Great o Return Appointment in 1 week. Electronic Signature(s) Signed: 11/30/2018 5:29:52 PM By: [name], BSN Signed: 12/02/2018 3:37:27 PM By: Worthy Keeler PA-C Entered By:  Heather Cool BSN, [name], CWS, [name] on 11/30/2018 17:29:52 [name] ([id number]) -------------------------------------------------------------------------------- Problem List Details Patient Name: Heather Dear A. Date of Service: 11/30/2018 11:00 AM Medical Record Number: [id number]  Patient Account Number: [id number] Date of Birth/Sex: 06/02/1976 (43 y.o. F) Treating [name]: Montey Hora Primary Care Provider: Priscille Kluver Other Clinician: Referring Provider: Priscille Kluver Treating Provider/Extender: Melburn Hake, HOYT Weeks in Treatment: 16 Active Problems ICD-10 Evaluated Encounter Code Description Active Date Today Diagnosis E11.621 Type 2 diabetes mellitus with foot ulcer 08/07/2018 No Yes L97.522 Non-pressure chronic ulcer of other part of left foot with fat 08/08/2018 No Yes layer exposed M21.372 Foot drop, left foot 09/11/2018 No Yes Inactive Problems Resolved Problems ICD-10 Code Description Active Date Resolved Date L03.032 Cellulitis of left toe 08/07/2018 08/07/2018 Electronic Signature(s) Signed: 12/02/2018 3:37:27 PM By: Worthy Keeler PA-C Entered By: Worthy Keeler on 11/30/2018 10:48:11 Siedlecki, Florestine A. ([id number]) -------------------------------------------------------------------------------- Progress Note Details Patient Name: Heather Dear A. Date of Service: 11/30/2018 11:00 AM Medical Record Number: [id number] Patient Account Number: [id number] Date of Birth/Sex: Jun 28, 1976 (43 y.o. F) Treating [name]: Montey Hora Primary Care Provider: Priscille Kluver Other Clinician: Referring Provider: Priscille Kluver Treating Provider/Extender: Melburn Hake, HOYT Weeks in Treatment: 16 Subjective Chief Complaint Information obtained from Patient Left great toe ulcer History of Present Illness (HPI) 01/18/18-She is here for initial evaluation of the left great toe ulcer. She is a poor historian in regards to timeframe in detail. She states approximately 4 weeks ago she lacerated her  toe on something in the house. She followed up with her primary care who placed her on Bactrim and ultimately a second dose of Bactrim prior to coming to wound clinic. She states she has been treating the toe with peroxide, Betadine and a Band-Aid. She did not check her blood sugar this morning but checked it yesterday morning it was 327; she is unaware of a recent A1c and there are no current records. She saw Dr. she would've orthopedics last week for an old injury to the left ankle, she states he did not see her toe, nor did she bring it to his attention. She smokes approximately 1 pack cigarettes a day. Her social situation is concerning, she arrives this morning with her mother who appears extremely intoxicated/under the influence; her mother was asked to leave the room and be monitored by the patient's grandmother. The patient's aunt then accompanied the patient and the room throughout the rest of the appointment. We had a lengthy discussion regarding the deleterious effects of uncontrolled hyperglycemia and smoking as it relates to wound healing and overall health. She was strongly encouraged to decrease her smoking and get her diabetes under better control. She states she is currently on a diet and has cut down her [facility] consumption. The left toe is erythematous, macerated and slightly edematous with malodor present. The edema in her left foot is below her baseline, there is no erythema streaking. We will treat her with Santyl, doxycycline; we have ordered and xray, culture and provided a Peg assist surgical shoe and cultured the wound. 01/25/18-She is here in follow-up evaluation for a left great toe ulcer and presents with an abscess to her suprapubic area. She states her blood sugars remain elevated, feeling "sick" and if levels are below 250, but she is trying. She has made no attempt to decrease her smoking stating that we "can't take away her food in her cigarettes". She has been  compliant with offloading using the PEG assist you. She is using Santyl daily. the culture obtained last week grew staph aureus and Enterococcus faecalis; continues on the doxycycline and Augmentin was added on Monday. The suprapubic area has erythema, no femoral  variation, purple discoloration, minimal induration, was accessed with a cotton tip applicator with sanguinopurulent drainage, this was cultured, I suspect the current antibiotic treatment will cover and we will not add anything to her current treatment plan. She was advised to go to urgent care or ER with any change in redness, induration or fever. 02/01/18-She is here in follow-up evaluation for left great toe ulcers and a new abdominal abscess from last week. She was able to use packing until earlier this week, where she "forgot it was there". She states she was feeling ill with GI symptoms last week and was not taking her antibiotic. She states her glucose levels have been predominantly less than 200, with occasional levels between 200-250. She thinks this was contributing to her GI symptoms as they have resolved without intervention. There continues to be significant laceration to left toe, otherwise it clinically looks stable/improved. There is now less superficial opening to the lateral aspect of the great toe that was residual blister. We will transition to [facility] to all wounds, she will continue her Augmentin. If there is no change or deterioration next week for reculture. 02/08/18-She is here in follow-up evaluation for left great toe ulcer and abdominal ulcer. There is an improvement in both wounds. She has been wrapping her left toe with coban, not by our direction, which has created an area of discoloration to the medial aspect; she has been advised to NOT use coban secondary to her neuropathy. She states her glucose levels have been high over this last week ranging from 200-350, she continues to smoke. She admits to being  less compliant with her offloading shoe. We will continue with same treatment plan and she will follow-up next week. 02/15/18-She is here in follow-up evaluation for left great toe ulcer and abdominal ulcer. The abdominal ulcer is epithelialized. The left great toe ulcer is improved and all injury from last week using the Coban wrap is resolved, the lateral ulcer is healed. She admits to noncompliance with wearing offloading shoe and admits to glucose levels being greater than 300 most of the week. She continues to smoke and expresses no desire to quit. There is one area medially that probes deeper than it has historically, erythema to the toe and dorsal foot has consistently waxed and waned. There is no overt signs of cellulitis or infection but we will culture the wound for any occult infection given the new area of depth and erythema. We will hold off on Manon, Kadejah A. ([id number]) sensitivities for initiation of antibiotic therapy. 02/22/18-She is here in follow up evaluation for left great toe ulcer. There is overall significant improvement in both wound appearance, erythema and edema with changes made last week. She was not initiated on antibiotic therapy. Culture obtained last week showed oxacillin sensitive staph aureus, sensitive to clindamycin. Clindamycin has been called into the pharmacy but she has been instructed to hold off on initiation secondary to overall clinical improvement and her history of antibiotic intolerance. She has been instructed to contact the clinic with any noted changes/deterioration and the wound, erythema, edema and/or pain. She will follow-up next week. She continues to smoke and her glucose levels remain elevated >250; she admits to compliance with offloading shoe 03/01/18 on evaluation today patient appears to be doing fairly well in regard to her left first toe ulcer. She has been tolerating the dressing changes with the [facility] Dressing without  complication and overall this has definitely showed signs of improvement according to records as  well is what the patient tells me today. I'm very pleased in that regard. She is having no pain today 03/08/18 She is here for follow up evaluation of a left great toe ulcer. She remains non-compliant with glucose control and smoking cessation; glucose levels consistently >200. She states that she got new shoe inserts/peg assist. She admits to compliance with offloading. Since my last evaluation there is significant improvement. We will switch to prisma at this time and she will follow up next week. She is noted to be tachycardic at this appointment, heart rate 120s; she has a history of heart rate 70-130 according to our records. She admits to extreme agitation r/t personal issues; she was advised to monitor her heartrate and contact her physician if it does not return to a more normal range (<100). She takes cardizem twice daily. 03/15/18-She is here in follow-up evaluation for left great toe ulcer. She remains noncompliant with glucose control and smoking cessation. She admits to compliance with wearing offloading shoe. The ulcer is improved/stable and we will continue with the same treatment plan and she will follow-up next week 03/22/18-She is here for evaluation for left great toe ulcer. There continues to be significant improvement despite recurrent hyperglycemia (over 500 yesterday) and she continues to smoke. She has been compliant with offloading and we will continue with same treatment plan and she will follow-up next week. 03/29/18-She is here for evaluation for left great toe ulcer. Despite continuing to smoke and uncontrolled diabetes she continues to improve. She is compliant with offloading shoe. We will continue with the same treatment plan and she will follow-up next week 04/05/18- She is here in follow up evaluation for a left great toe ulcer; she presents with small pustule to left fifth  toe (resembles ant bite). She admits to compliance with wearing offloading shoe; continues to smoke or have uncontrolled blood glucose control. There is more callus than usual with evidence of bleeding; she denies known trauma. 04/12/18-She is here for evaluation of left great toe ulcer. Despite noncompliance with glycemic control and smoking she continues to make improvement. She continues to wear offloading shoe. The pustule, that was identified last week, to the left fifth toe is resolved. She will follow-up in 2 weeks 05/03/18-she is seen in follow-up evaluation for a left great toe ulcer. She is compliant with offloading, otherwise noncompliant with glycemic control and smoking. She has plateaued and there is minimal improvement noted. We will transition to [facility], replaced the insert to her surgical shoe and she will follow-up in one week 05/10/18- She is here in follow up evaluation for a left great toe ulcer. It appears stable despite measurement change. We will continue with same treatment plan and follow up next week. 05/24/18-She is seen in follow-up evaluation for a left great toe ulcer. She remains compliant with offloading, has made significant improvement in her diet, decreasing the amount of sugar/soda. She said her recent A1c was 10.9 which is lower than. She did see a diabetic nutritionist/educator yesterday. She continues to smoke. We will continue with the same treatment plan and she'll follow-up next week. 05/31/18- She is seen in follow-up evaluation for left great toe ulcer. She continues to remain compliant with offloading, continues to make improvement in her diet, increasing her water and decreasing the amount of sugar/soda. She does continue to smoke with no desire to quit. We will apply Prisma to the depth and Hydrofera Blue over. We have not received insurance authorization for oasis. She will  follow up next week. 06/07/18-She is seen in follow-up evaluation for left  great toe ulcer. It has stalled according to today's measurements although base appears stable. She says she saw a diabetic educator yesterday; her average blood sugars are less than 300 which is an improvement for her. She continues to smoke and states "that's my next step" She continues with water over soda. We will order for xray, culture and reinstate ace wrap compression prior to placing apligraf for next week. She is voicing no complaints or concerns. Her dressing will change to iodoflex over the next week in preparation for apligraf. 06/14/18-She is seen in follow-up evaluation for left great toe ulcer. Plain film x-ray performed last week was negative for osteomyelitis. Wound culture obtained last week grew strep B and OSSA; she is initiated on keflex and cefdinir today; there is erythema to the toe which could be from ace wrap compression, she has a history of wrapping too tight and has has been encouraged to maintain ace wraps that we place today. We will hold off on application of apligraf today, will apply next week after antibiotic therapy has been initiated. She admits today that she has resumed taking a shower with her foot/toe submerged in water, she has been reminded to keep foot/toe out of the bath water. She will be seen in follow up next week 06/21/18-she is seen in follow-up evaluation for left great toe ulcer. She is tolerating antibiotic therapy with no GI disturbance. The wound is stable. Apligraf was applied today. She has been decreasing her smoking, only had 4 cigarettes yesterday and Capozzoli, Emaya A. ([id number]) 1 today. She continues being more compliant in diabetic diet. She will follow-up next week for evaluation of site, if stable will remove at 2 weeks. 06/28/18- She is here in follow up evalution. Apligraf was placed last week, she states the dressing fell off on Tuesday and she was dressing with hydrofera blue. She is healed and will be discharged from the clinic  today. She has been instructed to continue with smoking cessation, continue monitoring glucose levels, offloading for an additional 4 weeks and continue with hydrofera blue for additional two weeks for any possible microscopic opening. Readmission: 08/07/18 on evaluation today patient presents for reevaluation concerning the ulcer of her right great toe. She was previously discharged on 06/28/18 healed. Nonetheless she states that this began to show signs of drainage she subsequently went to her primary care provider. Subsequently an x-ray was performed on 08/01/18 which was negative. The patient was also placed on antibiotics at that time. Fortunately they should have been effective for the infection. Nonetheless she's been experiencing some improvement but still has a lot of drainage coming from the wound itself. 08/14/18 on evaluation today patient's wound actually does show signs of improvement in regard to the erythema at this point. She has completed the antibiotics. With that being said we did discuss the possibility of placing her in a total contact cast as of today although I think that I may want to give this just a little bit more time to ensure nothing recurrence as far as her infection is concerned. I do not want to put in the cast and risk infection at that time if things are not completely resolved. With that being said she is gonna require some debridement today. 08/21/18 on evaluation today patient actually appears to be doing okay in regard to her toe ulcer. She's been tolerating the dressing changes without complication. With that being said it  does appear that she is ready and in fact I think it's appropriate for [place] to go ahead and initiate the total contact cast today. Nonetheless she will require some sharp debridement to prepare the wound for application. Overall I feel like things have been progressing well but we do need to do something to get this to close more  readily. 08/24/18 patient seen today for reevaluation after having had the total contact cast applied on Tuesday. She seems to have done very well the wound appears to be doing great and overall I'm pleased with the progress that she's made. There were no abnormal areas of rubbing from the cast on her lower extremity. 08/30/18 on evaluation today patient actually appears to be completely healed in regard to her plantar toe ulcer. She tells me at this point she's been having a lot of issues with the cast. She almost fell a couple of times the state shall the step of her dog a couple times as well. This is been a very frustrating process for her other nonetheless she has completely healed the wound which is excellent news. Overall there does not appear to be the evidence of infection at this time which is great news. 09/11/18 evaluation today patient presents for follow-up concerning her great toe ulcer on the left which has unfortunately reopened since I last saw her which was only a couple of weeks ago. Unfortunately she was not able to get in to get the shoe and potentially the AFO that's gonna be necessary due to her left foot drop. She continues with offloading shoe but this is not enough to prevent her from reopening it appears. When we last had her in the total contact cast she did well from a healing standpoint but unfortunately the wound reopened as soon as she came out of the cast within just a couple of weeks. 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 This is also part of the reason why she falls on a regular basis. I just do not believe that is gonna be safe for [place] to reinitiate the total contact cast as last time we had this on she fell 3 times one week which is definitely not normal for  her. 09/18/18 upon evaluation today the patient actually appears to be doing about the same in regard to her toe ulcer. She did not contact [employer] as I asked her to even though I had given her the prescription. In fact she actually states that she has no idea where the prescription is. She did apparently call [employer] and they told her that all she needed to do was bring the prescription in order to be able to be seen and work on getting the AFO for her left foot. With all that being said she still does not have an appointment and I'm not sure were things stand that regard. I will give her a new prescription today in order to contact them to get this set up. 09/25/18 on evaluation today patient actually appears to be doing about the same in regard to her toes ulcer. She does have a small areas which seems to have a lot of callous buildup around the edge of the wound which is going to need sharp debridement today. She still is waiting  to be scheduled for evaluation with [employer] for possibility of an AFO. She states there supposed to call her tomorrow to get this set up. Unfortunately it does appear that her foot specifically the toe area is showing signs of erythema. There does not appear to be any systemic infection which is in these good news. SHREEYA, RECENDIZ A. ([id number]) 10/02/18 on evaluation today patient actually appears to be doing about the same in regard to her toe ulcer. This really has not done too well although it's not significantly larger it's also not significantly smaller. She has been tolerating the dressing changes without complication. She actually has her appointment with [facility] and [place] tomorrow to hopefully be measured for obtaining and AFO splint. I think this would be helpful preventing this from reoccurring. We had contemplated starting the cast this week although to be honest I am reluctant to do that as she's been having nausea, vomiting, and seizure activity over the  past three days. She has a history of seizures and have been told is nothing that can be done for these. With that being said I do believe that along with the seizures have the nausea vomiting which upon further questioning doesn't seem to be the normal for her and makes me concerned for the possibility of infection or something else going on. I discussed this with the patient and her mother during the office visit today. I do not feel the wound is effective but maybe something else. The responses this was "this just happens to her at times and we don't know why". They did not seem to be interested in going to the hospital to have this checked out further. 10/09/18 on evaluation today patient presents for follow-up concerning her ongoing toe ulcer. She has been tolerating the dressing changes without complication. Fortunately there does not appear to be any evidence of infection which is great news however I do think that the patient would benefit from going ahead for with the total contact cast. She's actually in a wheelchair today she tells me that she will use her walker if we initiate the cast. I was very specific about the fact that if we were gonna do the cast I wanted to make sure that she was using the walker in order to prevent any falls. She tells me she does not have stairs that she has to traverse on a regular basis at her home. She has not had any seizures since last week again that something that happens to her often she tells me she did talk to [employer] and they said that it may take up to three weeks to get the brace approved for her. Hopefully that will not take that long but nonetheless in the meantime I do think the cast could be of benefit. 10/12/18 on evaluation today patient appears to be doing rather well in regard to her toe ulcer. It's just been a few days and already this is significantly improved both as far as overall appearance and size. Fortunately there's no sign of  infection. She is here for her first obligatory cast change. 10/19/18 Seen today for follow up and management of left great toe ulcer. Wound continues to show improvement. Noted small open area with seroussang drainage with palpation. Denies any increased pain or recent fevers during visit. She will continue calcium alginate with offloading shoe. Denies any questions or concerns during visit. 10/26/18 on evaluation today patient appears to be doing about the same as when I last saw her  in regard to her wound bed. Fortunately there does not appear to be any signs of infection. Unfortunately she continues to have a breakdown in regard to the toe region any time that she is not in the cast. It takes almost no time at all for this to happen. Nonetheless she still has not heard anything from the brace being made by [employer] as to when exactly this will be available to her. Fortunately there is no signs of infection at this time. 10/30/18 on evaluation today patient presents for application of the total contact cast as we just received him this morning. Fortunately we are gonna be able to apply this to her today which is great news. She continues to have no significant pain which is good news. Overall I do feel like things have been improving while she was the cast is when she doesn't have a cast that things get worse. She still has not really heard anything from [facility] regarding her brace. 11/02/18 upon evaluation today patient's wound already appears to be doing significantly better which is good news. Fortunately there does not appear to be any signs of infection also good news. Overall I do think the total contact cast as before is helping to heal this area unfortunately it's just not gonna likely keep the area closed and healed without her getting her brace at least. Again the foot drop is a significant issue for her. 11/09/18 on evaluation today patient appears to be doing excellent in regard to her toe  ulcer which in fact is completely healed. Fortunately we finally got the situation squared away with the paperwork which was needed to proceed with getting her brace approved by Medicaid. I have filled that out unfortunately that information has been sent to the orthopedic office that I worked at 2 1/2 years ago and not tired Current wound care measures. Fortunately she seems to be doing very well at this time. 11/23/18 on evaluation today patient appears to be doing More Poorly Compared to Last Time I Saw Her. At [facility] She Had Completely Healed. Currently she is continuing to have issues with reopening. She states that she just found out that the brace was approved through Medicaid now she just has to go get measured in order to have this fitted for her and then made. Subsequently she does not have an appointment for this yet that is going to complicate things we obviously cannot put her back in the cast if we do not have everything measured because they're not gonna be able to measure her foot while she is in the cast. 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 Obviously this is unfortunate and does have me somewhat worried as well. 11/30/18 on evaluation today patient's toe ulcer actually appears to be doing fairly well. The good news is she will be getting her Gazzola, Kamylle A. ([id number]) brace in the shoes next week on Wednesday. Hopefully we will be able to get this to heal without having to go back in the cast however she may need the cast in order to get the wound completely heal and then go from there. Fortunately there's no signs of infection at this time. Patient History Information obtained from Patient. Social History Current every day smoker. Medical History Eyes Patient has history of Cataracts, Glaucoma, Optic Neuritis Ear/Nose/Mouth/Throat Patient has history of Chronic  sinus problems/congestion, Middle ear  problems Endocrine Patient has history of Type II Diabetes Review of Systems (ROS) Constitutional Symptoms (General Health) Denies complaints or symptoms of Fever, Chills. Respiratory The patient has no complaints or symptoms. Cardiovascular The patient has no complaints or symptoms. Psychiatric The patient has no complaints or symptoms. Objective Constitutional Well-nourished and well-hydrated in no acute distress. Vitals Time Taken: 11:05 AM, Height: 69 in, Weight: 240 lbs, BMI: 35.4, Temperature: 99.1 F, Pulse: 114 bpm, Respiratory Rate: 16 breaths/min, Blood Pressure: 114/74 mmHg. Respiratory normal breathing without difficulty. Psychiatric this patient is able to make decisions and demonstrates good insight into disease process. Alert and Oriented x 3. pleasant and cooperative. General Notes: Patient's wound bed currently shows signs of good granulation at this time. She appears to be doing rather well without any complication currently. Fortunately there is no evidence of infection she did have some Slough of the surface the wound she did have some callous as well that required sharp debridement this was performed without complication today post debridement the wound bed appears to be doing much better. Integumentary (Hair, Skin) Bailon, Jillianne A. ([id number]) Wound #5 status is Open. Original cause of wound was Gradually Appeared. The wound is located on the Left Toe Great. The wound measures 0.6cm length x 0.3cm width x 0.3cm depth; 0.141cm^2 area and 0.042cm^3 volume. There is Fat Layer (Subcutaneous Tissue) Exposed exposed. There is no tunneling or undermining noted. There is a small amount of serous drainage noted. The wound margin is epibole. There is large (67-100%) red granulation within the wound bed. There is no necrotic tissue within the wound bed. The periwound skin appearance exhibited: Callus. The periwound skin appearance  did not exhibit: Crepitus, Excoriation, Induration, Rash, Scarring, Dry/Scaly, Maceration, Atrophie Blanche, Cyanosis, Ecchymosis, Hemosiderin Staining, Mottled, Pallor, Rubor, Erythema. 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 There was a Excisional Skin/Subcutaneous Tissue Debridement with a total area of 0.3 sq cm performed by STONE III, HOYT E., PA-C. With the following instrument(s): Curette to remove Viable and Non-Viable tissue/material. Material removed includes Callus, Subcutaneous Tissue, and Slough after achieving pain control using Lidocaine. No specimens were taken. A time out was conducted at 11:30, prior to the start of the procedure. A Minimum amount of bleeding was controlled with Pressure. The procedure was tolerated well. Post Debridement Measurements: 0.6cm length x 0.3cm width x 0.4cm depth; 0.057cm^3 volume. Character of Wound/Ulcer Post Debridement is stable. Severity of Tissue Post Debridement is: Fat layer exposed. Post procedure Diagnosis Wound #5: Same as Pre-Procedure Plan Wound Cleansing: Wound #5 Left Toe Great: Clean wound with Normal Saline. Anesthetic (add to Medication List): Wound #5 Left Toe Great: Topical Lidocaine 4% cream applied to wound bed prior to debridement (In Clinic Only). Primary Wound Dressing: Wound #5 Left Toe Great: Silver Collagen Secondary Dressing: Wound #5 Left Toe Great: Conform/Kerlix - foam Dressing Change Frequency: Denker, Michella A. ([id number]) Wound #5 Left Toe Great: Three times weekly Follow-up Appointments: Wound #5 Left Toe Great: Return Appointment in 1 week. My suggestion currently is going  to be that we continue with the above wound care measures for the next week. Patient is in agreement with plan. We will subsequently see were things stand at follow-up.  If anything changes or worsens meantime patient will contact the office and let me know. Please see above for specific wound care orders. We will see patient for re-evaluation in 1 week(s) here in the clinic. If anything worsens or changes patient will contact our office for additional recommendations. by next week she should have her new brace which will be good and hopefully helpful in preventing this type of thing from reoccurring. Electronic Signature(s) Signed: 12/02/2018 3:37:27 PM By: Worthy Keeler PA-C Entered By: Worthy Keeler on 12/02/2018 15:34:06 [name] ([id number]) -------------------------------------------------------------------------------- ROS/PFSH Details Patient Name: Heather Dear A. Date of Service: 11/30/2018 11:00 AM Medical Record Number: [id number] Patient Account Number: [id number] Date of Birth/Sex: [birth year] (43 y.o. F) Treating [name]: Montey Hora Primary Care Provider: Priscille Kluver Other Clinician: Referring Provider: Priscille Kluver Treating Provider/Extender: STONE III, HOYT Weeks in Treatment: 16 Information Obtained From Patient Wound History Do you currently have one or more open woundso Yes How many open wounds do you currently haveo 1 Constitutional Symptoms (General Health) Complaints and Symptoms: Negative for: Fever; Chills Eyes Medical History: Positive for: Cataracts; Glaucoma; Optic Neuritis Ear/Nose/Mouth/Throat Medical History: Positive for: Chronic sinus problems/congestion; Middle ear problems Respiratory Complaints and Symptoms: No Complaints or Symptoms Cardiovascular Complaints and Symptoms: No Complaints or Symptoms Endocrine Medical History: Positive for: Type II Diabetes Time with diabetes: 10 years Treated with: Insulin, Oral agents Blood sugar  tested every day: No Blood sugar testing results: Lunch: 350 Psychiatric Complaints and Symptoms: No Complaints or Symptoms HBO Extended History Items Membreno, Sherrice A. ([id number]) Eyes: Eyes: Ear/Nose/Mouth/Throat: Ear/Nose/Mouth/Throat: Cataracts Glaucoma Chronic sinus Middle ear problems problems/congestion Immunizations Pneumococcal Vaccine: Received Pneumococcal Vaccination: No Implantable Devices Family and Social History Current every day smoker Physician Affirmation I have reviewed and agree with the above information. Electronic Signature(s) Signed: 11/30/2018 5:34:10 PM By: Montey Hora Signed: 12/02/2018 3:37:27 PM By: Worthy Keeler PA-C Entered By: Worthy Keeler on 11/30/2018 12:26:06 [name] ([id number]) -------------------------------------------------------------------------------- SuperBill Details Patient Name: Heather Dear A. Date of Service: 11/30/2018 Medical Record Number: [id number] Patient Account Number: [id number] Date of Birth/Sex: [birth year] (43 y.o. F) Treating [name]: Montey Hora Primary Care Provider: Priscille Kluver Other Clinician: Referring Provider: Priscille Kluver Treating Provider/Extender: Melburn Hake, HOYT Weeks in Treatment: 16 Diagnosis Coding ICD-10 Codes Code Description E11.621 Type 2 diabetes mellitus with foot ulcer L97.522 Non-pressure chronic ulcer of other part of left foot with fat layer exposed M21.372 Foot drop, left foot Facility Procedures CPT4 Code: [id number] Description: 90300 - DEB SUBQ TISSUE 20 SQ CM/< ICD-10 Diagnosis Description L97.522 Non-pressure chronic ulcer of other part of left foot with fat Modifier: layer exposed Quantity: 1 Physician Procedures CPT4 Code: [id number] Description: 62263 - WC PHYS SUBQ TISS 20 SQ CM ICD-10 Diagnosis Description L97.522 Non-pressure chronic ulcer of other part of left foot with fat Modifier: layer exposed Quantity: 1 Electronic Signature(s) Signed: 12/02/2018 3:37:27 PM  By: Worthy Keeler PA-C Entered By: Worthy Keeler on 12/02/2018 15:34:14

## 2018-12-05 NOTE — Progress Notes (Signed)
DIARA, CHAUDHARI ([id number]) Visit Report for 11/30/2018 Arrival Information Details Patient Name: [name], [name] Date of Service: 11/30/2018 11:00 AM Medical Record Number: [id number] Patient Account Number: [id number] Date of Birth/Sex: [birth year] (43 y.o. F) Treating RN: Montey Hora Primary Care Ambria Mayfield: Priscille Kluver Other Clinician: Referring Markanthony Gedney: Priscille Kluver Treating Roc Streett/Extender: Melburn Hake, HOYT Weeks in Treatment: 16 Visit Information History Since Last Visit Added or deleted any medications: No Patient Arrived: Wheel Chair Any new allergies or adverse reactions: No Arrival Time: 10:58 Had a fall or experienced change in No activities of daily living that may affect Accompanied By: mother risk of falls: Transfer Assistance: None Signs or symptoms of abuse/neglect since last visito No Patient Identification Verified: Yes Hospitalized since last visit: No Secondary Verification Process Completed: Yes Implantable device outside of the clinic excluding No Patient Requires Transmission-Based No cellular tissue based products placed in the center Precautions: since last visit: Patient Has Alerts: No Has Dressing in Place as Prescribed: Yes Pain Present Now: No Electronic Signature(s) Signed: 11/30/2018 12:00:27 PM By: Lorine Bears RCP, RRT, CHT Entered By: Lorine Bears on 11/30/2018 11:05:00 [name] ([id number]) -------------------------------------------------------------------------------- Encounter Discharge Information Details Patient Name: Heather Dear A. Date of Service: 11/30/2018 11:00 AM Medical Record Number: [id number] Patient Account Number: [id number] Date of Birth/Sex: Dec 08, 1975 (43 y.o. F) Treating RN: Cornell Barman Primary Care Maeson Purohit: Priscille Kluver Other Clinician: Referring Shogo Larkey: Priscille Kluver Treating Kyandre Okray/Extender: Melburn Hake, HOYT Weeks in Treatment: 16 Encounter Discharge Information Items  Post Procedure Vitals Discharge Condition: Stable Temperature (F): 98.7 Ambulatory Status: Wheelchair Pulse (bpm): 100 Discharge Destination: Home Respiratory Rate (breaths/min): 16 Transportation: Private Auto Blood Pressure (mmHg): 114/78 Accompanied By: self Schedule Follow-up Appointment: Yes Clinical Summary of Care: Electronic Signature(s) Signed: 11/30/2018 5:34:21 PM By: Gretta Cool, BSN, RN, CWS, [name] RN, BSN Entered By: Gretta Cool, BSN, RN, CWS, [name] on 11/30/2018 17:34:21 [name] ([id number]) -------------------------------------------------------------------------------- Lower Extremity Assessment Details Patient Name: Heather Dear A. Date of Service: 11/30/2018 11:00 AM Medical Record Number: [id number] Patient Account Number: [id number] Date of Birth/Sex: 05/21/1976 (43 y.o. F) Treating RN: Cornell Barman Primary Care Kayo Zion: Priscille Kluver Other Clinician: Referring Kathya Wilz: Priscille Kluver Treating Derian Dimalanta/Extender: Melburn Hake, HOYT Weeks in Treatment: 16 Edema Assessment Assessed: [Left: No] [Right: No] Edema: [Left: N] [Right: o] Vascular Assessment Pulses: Dorsalis Pedis Palpable: [Left:Yes] Posterior Tibial Extremity colors, hair growth, and conditions: Extremity Color: [Left:Normal] Hair Growth on Extremity: [Left:Yes] Temperature of Extremity: [Left:Warm] Capillary Refill: [Left:< 3 seconds] Toe Nail Assessment Left: Right: Thick: No Discolored: No Deformed: No Improper Length and Hygiene: No Electronic Signature(s) Signed: 12/04/2018 11:53:49 AM By: Gretta Cool, BSN, RN, CWS, [name] RN, BSN Entered By: Gretta Cool, BSN, RN, CWS, [name] on 11/30/2018 11:12:27 [name] -------------------------------------------------------------------------------- Multi Wound Chart Details Patient Name: Heather Dear A. Date of Service: 11/30/2018 11:00 AM Medical Record Number: [id number] Patient Account Number: [id number] Date of Birth/Sex: 1975-12-02 (43 y.o.  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] Comorbid History: [5:Cataracts, Glaucoma, Optic Neuritis, Chronic sinus problems/congestion, Middle ear problems, Type II Diabetes] [N/A:N/A] Date Acquired: [5:11/16/2018] [N/A:N/A] Weeks of Treatment: [5:1] [N/A:N/A] Wound Status: [5:Open] [N/A:N/A] Measurements  L x W x D [5:0.6x0.3x0.3] [N/A:N/A] (cm) Area (cm) : [5:0.141] [N/A:N/A] Volume (cm) : [5:0.042] [N/A:N/A] % Reduction in Area: [5:64.10%] [N/A:N/A] % Reduction in Volume: [5:73.20%] [N/A:N/A] Classification: [5:Grade 3] [N/A:N/A] Exudate Amount: [5:Small] [N/A:N/A] Exudate Type: [5:Serous] [N/A:N/A] Exudate Color: [5:amber] [N/A:N/A] Wound Margin: [5:Epibole] [N/A:N/A] Granulation Amount: [5:Large (67-100%)] [N/A:N/A] Granulation Quality: [5:Red] [N/A:N/A] Necrotic Amount: [5:None Present (0%)] [N/A:N/A] Exposed Structures: [5:Fat Layer (Subcutaneous Tissue) Exposed: Yes Fascia: No Tendon: No Muscle: No Joint: No Bone: No] [N/A:N/A] Epithelialization: [5:None] [N/A:N/A] Periwound Skin Texture: [5:Callus: Yes Excoriation: No Induration: No] [N/A:N/A] Crepitus: No Rash: No Scarring: No Periwound Skin Moisture: Maceration: No N/A N/A Dry/Scaly: No Periwound Skin Color: Atrophie Blanche: No N/A N/A Cyanosis: No Ecchymosis: No Erythema: No Hemosiderin Staining: No Mottled: No Pallor: No Rubor: No Tenderness on Palpation: No N/A N/A Wound Preparation: Ulcer  Cleansing: N/A N/A Rinsed/Irrigated with Saline Topical Anesthetic Applied: Other: lidocaine 4% Treatment Notes Electronic Signature(s) Signed: 12/04/2018 11:53:49 AM By: Gretta Cool, BSN, RN, CWS, [name] RN, BSN Entered By: Gretta Cool, BSN, RN, CWS, [name] on 11/30/2018 11:12:39 [name] ([id number]) -------------------------------------------------------------------------------- Multi-Disciplinary Care Plan Details Patient Name: Heather Dear A. Date of Service: 11/30/2018 11:00 AM Medical Record Number: [id number] Patient Account Number: [id number] Date of Birth/Sex: 1976/05/21 (43 y.o. F) Treating RN: Cornell Barman Primary Care Yatziry Deakins: Priscille Kluver Other Clinician: Referring Kashius Dominic: Priscille Kluver Treating Laketra Bowdish/Extender: Melburn Hake, HOYT Weeks in Treatment: 16 Active Inactive Abuse / Safety / Falls / Self Care Management Nursing Diagnoses: History of Falls Goals: Patient will not experience any injury related to falls Date Initiated: 08/07/2018 Target Resolution Date: 10/19/2018 Goal Status: Active Interventions: Assess fall risk on admission and as needed Notes: Orientation to the Wound Care Program Nursing Diagnoses: Knowledge deficit related to the wound healing center program Goals: Patient/caregiver will verbalize understanding of the [place] Date Initiated: 08/07/2018 Target Resolution Date: 10/19/2018 Goal Status: Active Interventions: Provide education on orientation to the wound center Notes: Soft Tissue Infection Nursing Diagnoses: Impaired tissue integrity Goals: Patient will remain free of wound infection Date Initiated: 08/07/2018 Target Resolution Date: 10/19/2018 Goal Status: Active Interventions: Assess signs and symptoms of infection every visit Potteiger, Rigby A. ([id number]) Notes: Wound/Skin Impairment Nursing Diagnoses: Impaired tissue integrity Goals: Ulcer/skin breakdown will heal within 14 weeks Date Initiated:  08/07/2018 Target Resolution Date: 10/19/2018 Goal Status: Active Interventions: Assess patient/caregiver ability to obtain necessary supplies Notes: Electronic Signature(s) Signed: 12/04/2018 11:53:49 AM By: Gretta Cool, BSN, RN, CWS, [name] RN, BSN Entered By: Gretta Cool, BSN, RN, CWS, [name] on 11/30/2018 11:12:33 [name] ([id number]) -------------------------------------------------------------------------------- Pain Assessment Details Patient Name: Heather Dear A. Date of Service: 11/30/2018 11:00 AM Medical Record Number: [id number] Patient Account Number: [id number] Date of Birth/Sex: 10-14-1976 (43 y.o. F) Treating RN: Montey Hora Primary Care Ranen Doolin: Priscille Kluver Other Clinician: Referring Haleem Hanner: Priscille Kluver Treating Kenzly Rogoff/Extender: STONE III, HOYT Weeks in Treatment: 16 Active Problems Location of Pain Severity and Description of Pain Patient Has Paino No Site Locations Pain Management and Medication Current Pain Management: Electronic Signature(s) Signed: 11/30/2018 12:00:27 PM By: Lorine Bears RCP, RRT, CHT Signed: 11/30/2018 5:34:10 PM By: Montey Hora Entered By: Lorine Bears on 11/30/2018 11:05:07 [name] ([id number]) -------------------------------------------------------------------------------- Patient/Caregiver Education Details Patient Name: Heather Dear A. Date of Service: 11/30/2018 11:00 AM Medical Record Number: [id number] Patient Account Number: [id number] Date of Birth/Gender: 1976/01/19 (43 y.o. F) Treating RN: Cornell Barman Primary Care Physician: Priscille Kluver Other Clinician: Referring Physician: Priscille Kluver Treating Physician/Extender: STONE III, HOYT Weeks in  Treatment: 16 Education Assessment Education Provided To: Patient Education Topics Provided Pressure: Handouts: Pressure Ulcers: Care and Offloading, Other: keep pressure off of foot Methods: Demonstration, Explain/Verbal Responses: State  content correctly Electronic Signature(s) Signed: 12/04/2018 11:53:49 AM By: Gretta Cool, BSN, RN, CWS, [name] RN, BSN Entered By: Gretta Cool, BSN, RN, CWS, [name] on 11/30/2018 17:32:56 [name] ([id number]) -------------------------------------------------------------------------------- Wound Assessment Details Patient Name: Heather Dear A. Date of Service: 11/30/2018 11:00 AM Medical Record Number: [id number] Patient Account Number: [id number] Date of Birth/Sex: 06/25/1976 (43 y.o. F) Treating RN: Cornell Barman Primary Care Daryl Beehler: Priscille Kluver Other Clinician: Referring Dontee Jaso: Priscille Kluver Treating Tamel Abel/Extender: STONE III, HOYT Weeks in Treatment: 16 Wound Status Wound Number: 5 Primary Diabetic Wound/Ulcer of the Lower Extremity Etiology: Wound Location: Left Toe Great Wound Open Wounding Event: Gradually Appeared Status: Date Acquired: 11/16/2018 Comorbid Cataracts, Glaucoma, Optic Neuritis, Chronic Weeks Of Treatment: 1 History: sinus problems/congestion, Middle ear problems, Clustered Wound: No Type II Diabetes Photos Photo Uploaded By: Gretta Cool, BSN, RN, CWS, [name] on 12/04/2018 11:59:48 Wound Measurements Length: (cm) 0.6 Width: (cm) 0.3 Depth: (cm) 0.3 Area: (cm) 0.141 Volume: (cm) 0.042 % Reduction in Area: 64.1% % Reduction in Volume: 73.2% Epithelialization: None Tunneling: No Undermining: No Wound Description Classification: Grade 3 Foul Odor Wound Margin: Epibole Slough/Fi Exudate Amount: Small Exudate Type: Serous Exudate Color: amber After Cleansing: No brino No Wound Bed Granulation Amount: Large (67-100%) Exposed Structure Granulation Quality: Red Fascia Exposed: No Necrotic Amount: None Present (0%) Fat Layer (Subcutaneous Tissue) Exposed: Yes Tendon Exposed: No Muscle Exposed: No Joint Exposed: No Bone Exposed: No Periwound Skin Texture [name], [name] ([id number]) Texture Color No Abnormalities Noted: No No Abnormalities Noted:  No Callus: Yes Atrophie Blanche: No Crepitus: No Cyanosis: No Excoriation: No Ecchymosis: No Induration: No Erythema: No Rash: No Hemosiderin Staining: No Scarring: No Mottled: No Pallor: No Moisture Rubor: No No Abnormalities Noted: No Dry / Scaly: No Maceration: No Wound Preparation Ulcer Cleansing: Rinsed/Irrigated with Saline Topical Anesthetic Applied: Other: lidocaine 4%, Treatment Notes Wound #5 (Left Toe Great) Notes prisma, foam, conform tape Electronic Signature(s) Signed: 12/04/2018 11:53:49 AM By: Gretta Cool, BSN, RN, CWS, [name] RN, BSN Entered By: Gretta Cool, BSN, RN, CWS, [name] on 11/30/2018 11:11:50 [name] ([id number]) -------------------------------------------------------------------------------- Vitals Details Patient Name: Heather Dear A. Date of Service: 11/30/2018 11:00 AM Medical Record Number: [id number] Patient Account Number: [id number] Date of Birth/Sex: 1976/10/18 (43 y.o. F) Treating RN: Montey Hora Primary Care Andrius Andrepont: Priscille Kluver Other Clinician: Referring Meris Reede: Priscille Kluver Treating Ema Hebner/Extender: STONE III, HOYT Weeks in Treatment: 16 Vital Signs Time Taken: 11:05 Temperature (F): 99.1 Height (in): 69 Pulse (bpm): 114 Weight (lbs): 240 Respiratory Rate (breaths/min): 16 Body Mass Index (BMI): 35.4 Blood Pressure (mmHg): 114/74 Reference Range: 80 - 120 mg / dl Airway Electronic Signature(s) Signed: 11/30/2018 12:00:27 PM By: Lorine Bears RCP, RRT, CHT Entered By: Lorine Bears on 11/30/2018 11:08:01

## 2018-12-07 ENCOUNTER — Other Ambulatory Visit
Admission: RE | Admit: 2018-12-07 | Discharge: 2018-12-07 | Disposition: A | Discharge status: Home / Self Care | Payer: Medicaid Other | Source: Ambulatory Visit | Attending: Physician Assistant | Admitting: Physician Assistant

## 2018-12-07 ENCOUNTER — Encounter: Payer: Medicaid Other | Admitting: Physician Assistant

## 2018-12-07 DIAGNOSIS — E11621 Type 2 diabetes mellitus with foot ulcer: Secondary | ICD-10-CM | POA: Diagnosis not present

## 2018-12-07 DIAGNOSIS — L089 Local infection of the skin and subcutaneous tissue, unspecified: Secondary | ICD-10-CM | POA: Diagnosis present

## 2018-12-09 LAB — AEROBIC CULTURE W GRAM STAIN (SUPERFICIAL SPECIMEN): Gram Stain: NONE SEEN

## 2018-12-10 ENCOUNTER — Ambulatory Visit: Payer: Self-pay | Admitting: Physician Assistant

## 2018-12-10 NOTE — Progress Notes (Signed)
EVADEAN, SPROULE ([id number]) Visit Report for 12/07/2018 Arrival Information Details Patient Name: [name], [name] Date of Service: 12/07/2018 2:15 PM Medical Record Number: [id number] Patient Account Number: [id number] Date of Birth/Sex: 1976-08-05 (43 y.o. F) Treating RN: Montey Hora Primary Care Dewain Platz: Priscille Kluver Other Clinician: Referring Addilyne Backs: Priscille Kluver Treating Siddarth Hsiung/Extender: Melburn Hake, HOYT Weeks in Treatment: 97 Visit Information History Since Last Visit Added or deleted any medications: No Patient Arrived: Wheel Chair Any new allergies or adverse reactions: No Arrival Time: 14:10 Had [name] fall or experienced change in No activities of daily living that may affect Accompanied By: mother risk of falls: Transfer Assistance: None Signs or symptoms of abuse/neglect since last visito No Patient Identification Verified: Yes Hospitalized since last visit: No Secondary Verification Process Completed: Yes Implantable device outside of the clinic excluding No Patient Requires Transmission-Based No cellular tissue based products placed in the center Precautions: since last visit: Patient Has Alerts: No Has Dressing in Place as Prescribed: Yes Has Footwear/Offloading in Place as Prescribed: Yes Left: Other:AFO splint Pain Present Now: No Electronic Signature(s) Signed: 12/07/2018 4:52:28 PM By: Montey Hora Entered By: Montey Hora on 12/07/2018 14:12:57 Pella, [name] Kitchen ([id number]) -------------------------------------------------------------------------------- Lower Extremity Assessment Details Patient Name: [name] Date of Service: 12/07/2018 2:15 PM Medical Record Number: [id number] Patient Account Number: [id number] Date of Birth/Sex: 1976/03/21 (43 y.o. F) Treating RN: Montey Hora Primary Care Sayan Aldava: Priscille Kluver Other Clinician: Referring Duru Reiger: Priscille Kluver Treating Faydra Korman/Extender: STONE III, HOYT Weeks in Treatment:  17 Edema Assessment Assessed: [Left: No] [Right: No] Edema: [Left: N] [Right: o] Vascular Assessment Pulses: Dorsalis Pedis Palpable: [Left:Yes] Posterior Tibial Extremity colors, hair growth, and conditions: Extremity Color: [Left:Normal] Hair Growth on Extremity: [Left:Yes] Temperature of Extremity: [Left:Warm] Capillary Refill: [Left:< 3 seconds] Toe Nail Assessment Left: Right: Thick: Yes Discolored: No Deformed: No Improper Length and Hygiene: No Electronic Signature(s) Signed: 12/07/2018 4:52:28 PM By: Montey Hora Entered By: Montey Hora on 12/07/2018 14:18:13 Stamps, [name] ([id number]) -------------------------------------------------------------------------------- Multi Wound Chart Details Patient Name: [name] Date of Service: 12/07/2018 2:15 PM Medical Record Number: [id number] Patient Account Number: [id number] Date of Birth/Sex: [birth year] (43 y.o. 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] Comorbid History: [5:Cataracts, Glaucoma, Optic Neuritis, Chronic sinus problems/congestion, Middle ear problems, Type II Diabetes] [N/[name]:N/[name]] Date Acquired: [5:11/16/2018] [N/[name]:N/[name]] Weeks of Treatment: [5:2] [N/[name]:N/[name]] Wound Status: [5:Open] [N/[name]:N/[name]] Measurements L x W x D [5:1x0.6x0.5] [N/[name]:N/[name]] (cm) Area (cm) : [5:0.471] [N/[name]:N/[name]] Volume (cm) : [5:0.236] [N/[name]:N/[name]] % Reduction in Area: [5:-19.80%] [N/[name]:N/[name]] % Reduction in Volume: [5:-50.30%]  [N/[name]:N/[name]] Starting Position 1 [5:6] (o'clock): Ending Position 1 [5:1] (o'clock): Maximum Distance 1 (cm): [5:0.5] Undermining: [5:Yes] [N/[name]:N/[name]] Classification: [5:Grade 3] [N/[name]:N/[name]] Exudate Amount: [5:Small] [N/[name]:N/[name]] Exudate Type: [5:Serous] [N/[name]:N/[name]] Exudate Color: [5:amber] [N/[name]:N/[name]] Wound Margin: [5:Epibole] [N/[name]:N/[name]] Granulation Amount: [5:Large (67-100%)] [N/[name]:N/[name]] Granulation Quality: [5:Red] [N/[name]:N/[name]] Necrotic Amount: [5:Small (1-33%)] [N/[name]:N/[name]] Exposed Structures: [5:Fat Layer (Subcutaneous Tissue) Exposed: Yes Fascia: No Tendon: No Muscle: No] [N/[name]:N/[name]] Joint: No Bone: No Epithelialization: None N/[name] N/[name] Periwound Skin Texture: Callus: Yes N/[name] N/[name] Excoriation: No Induration: No Crepitus: No Rash: No Scarring: No Periwound Skin Moisture: Maceration: No N/[name] N/[name] Dry/Scaly:  No Periwound Skin Color: Erythema: Yes N/[name] N/[name] Atrophie Blanche: No Cyanosis: No Ecchymosis: No Hemosiderin Staining: No Mottled: No Pallor: No Rubor: No Erythema Location: Circumferential N/[name] N/[name] Temperature: No Abnormality N/[name] N/[name] Tenderness on Palpation: No N/[name] N/[name] Wound Preparation: Ulcer Cleansing: N/[name] N/[name] Rinsed/Irrigated with Saline Topical Anesthetic Applied: Other: lidocaine 4% Treatment Notes Electronic Signature(s) Signed: 12/10/2018 9:36:27 AM By: Harold Barban Entered By: Harold Barban on 12/07/2018 15:08:16 [name] ([id number]) -------------------------------------------------------------------------------- [place] Details Patient Name: [name] Date of Service: 12/07/2018 2:15 PM Medical Record Number: [id number] Patient Account Number: [id number] Date of Birth/Sex: May 14, 1976 (43 y.o. F) Treating RN: Harold Barban Primary Care Keenya Matera: Priscille Kluver Other Clinician: Referring Joselyn Edling: Priscille Kluver Treating Candas Deemer/Extender: Melburn Hake, HOYT Weeks in Treatment: 17 Active Inactive Abuse / Safety / Falls / Self Care  Management Nursing Diagnoses: History of Falls Goals: Patient will not experience any injury related to falls Date Initiated: 08/07/2018 Target Resolution Date: 10/19/2018 Goal Status: Active Interventions: Assess fall risk on admission and as needed Notes: Orientation to the Wound Care Program Nursing Diagnoses: Knowledge deficit related to the wound healing center program Goals: Patient/caregiver will verbalize understanding of the [place] Program Date Initiated: 08/07/2018 Target Resolution Date: 10/19/2018 Goal Status: Active Interventions: Provide education on orientation to the wound center Notes: Soft Tissue Infection Nursing Diagnoses: Impaired tissue integrity Goals: Patient will remain free of wound infection Date Initiated: 08/07/2018 Target Resolution Date: 10/19/2018 Goal Status: Active Interventions: Assess signs and symptoms of infection every visit Lefevers, [name] ([id number]) Notes: Wound/Skin Impairment Nursing Diagnoses: Impaired tissue integrity Goals: Ulcer/skin breakdown will heal within 14 weeks Date Initiated: 08/07/2018 Target Resolution Date: 10/19/2018 Goal Status: Active Interventions: Assess patient/caregiver ability to obtain necessary supplies Notes: Electronic Signature(s) Signed: 12/10/2018 9:36:27 AM By: Harold Barban Entered By: Harold Barban on 12/07/2018 15:08:00 Shaver, [name] ([id number]) -------------------------------------------------------------------------------- Pain Assessment Details Patient Name: [name] Date of Service: 12/07/2018 2:15 PM Medical Record Number: [id number] Patient Account Number: [id number] Date of Birth/Sex: 1976/10/16 (42 y.o. F) Treating RN: Montey Hora Primary Care Vicent Febles: Priscille Kluver Other Clinician: Referring Adean Milosevic: Priscille Kluver Treating Talajah Slimp/Extender: STONE III, HOYT Weeks in Treatment: 17 Active Problems Location of Pain Severity and Description  of Pain Patient Has Paino No Site Locations Pain Management and Medication Current Pain Management: Electronic Signature(s) Signed: 12/07/2018 4:52:28 PM By: Montey Hora Entered By: Montey Hora on 12/07/2018 14:13:02 [name] ([id number]) -------------------------------------------------------------------------------- Patient/Caregiver Education Details Patient Name: [name] Date of Service: 12/07/2018 2:15 PM Medical Record Number: [id number] Patient Account Number: [id number] Date of Birth/Gender: 1976/01/04 (43 y.o. F) Treating RN: Harold Barban Primary Care Physician: Priscille Kluver Other Clinician: Referring Physician: Priscille Kluver Treating Physician/Extender: Sharalyn Ink in Treatment: 17 Education Assessment Education Provided To: Patient Education Topics Provided Wound/Skin Impairment: Handouts: Caring for Your Ulcer Methods: Demonstration, Explain/Verbal Responses: State content correctly Electronic Signature(s) Signed: 12/10/2018 9:36:27 AM By: Harold Barban Entered By: Harold Barban on 12/07/2018 15:08:31 [name] ([id number]) -------------------------------------------------------------------------------- Wound Assessment Details Patient Name: [name] Date of Service: 12/07/2018 2:15 PM Medical Record Number: [id number] Patient Account Number: [id number] Date of Birth/Sex: 08/28/1976 (43 y.o. F) Treating RN: Montey Hora Primary Care Andrey Hoobler: Priscille Kluver Other Clinician: Referring Marlayna Bannister: Priscille Kluver Treating Keoni Havey/Extender: STONE III, HOYT Weeks in Treatment: 17 Wound Status Wound Number: 5 Primary Diabetic Wound/Ulcer of the Lower Extremity Etiology: Wound Location: Left Toe Great Wound Open Wounding Event: Gradually Appeared Status: Date Acquired: 11/16/2018 Comorbid Cataracts,  Glaucoma, Optic Neuritis, Chronic Weeks Of Treatment: 2 History: sinus problems/congestion, Middle ear  problems, Clustered Wound: No Type II Diabetes Photos Photo Uploaded By: Gretta Cool, BSN, RN, CWS, [name] on 12/07/2018 16:19:48 Wound Measurements Length: (cm) 1 % Reducti Width: (cm) 0.6 % Reducti Depth: (cm) 0.5 Epithelia Area: (cm) 0.471 Tunnelin Volume: (cm) 0.236 Undermin Starti Ending Maximu on in Area: -19.8% on in Volume: -50.3% lization: None g: No ing: Yes ng Position (o'clock): 6 Position (o'clock): 1 m Distance: (cm) 0.5 Wound Description Classification: Grade 3 Foul Odor Wound Margin: Epibole Slough/Fi Exudate Amount: Small Exudate Type: Serous Exudate Color: amber After Cleansing: No brino No Wound Bed Granulation Amount: Large (67-100%) Exposed Structure Granulation Quality: Red Fascia Exposed: No Necrotic Amount: Small (1-33%) Fat Layer (Subcutaneous Tissue) Exposed: Yes Necrotic Quality: Adherent Slough Tendon Exposed: No Muscle Exposed: No Alleyne, [name] ([id number]) Joint Exposed: No Bone Exposed: No Periwound Skin Texture Texture Color No Abnormalities Noted: No No Abnormalities Noted: No Callus: Yes Atrophie Blanche: No Crepitus: No Cyanosis: No Excoriation: No Ecchymosis: No Induration: No Erythema: Yes Rash: No Erythema Location: Circumferential Scarring: No Hemosiderin Staining: No Mottled: No Moisture Pallor: No No Abnormalities Noted: No Rubor: No Dry / Scaly: No Maceration: No Temperature / Pain Temperature: No Abnormality Wound Preparation Ulcer Cleansing: Rinsed/Irrigated with Saline Topical Anesthetic Applied: Other: lidocaine 4%, Treatment Notes Wound #5 (Left Toe Great) Notes gentamicin cream, silvercel, gauze and conform Electronic Signature(s) Signed: 12/07/2018 4:52:28 PM By: Montey Hora Entered By: Montey Hora on 12/07/2018 14:17:51 [name], [name] ([id number]) -------------------------------------------------------------------------------- Vitals Details Patient Name: [name] Date of  Service: 12/07/2018 2:15 PM Medical Record Number: [id number] Patient Account Number: [id number] Date of Birth/Sex: 1976-09-06 (43 y.o. F) Treating RN: Montey Hora Primary Care Adalind Weitz: Priscille Kluver Other Clinician: Referring Anaika Santillano: Priscille Kluver Treating Saif Peter/Extender: STONE III, HOYT Weeks in Treatment: 17 Vital Signs Time Taken: 14:14 Temperature (F): 98.6 Height (in): 69 Pulse (bpm): 99 Weight (lbs): 240 Respiratory Rate (breaths/min): 16 Body Mass Index (BMI): 35.4 Blood Pressure (mmHg): 107/78 Reference Range: 80 - 120 mg / dl Airway Electronic Signature(s) Signed: 12/07/2018 4:52:28 PM By: Montey Hora Entered By: Montey Hora on 12/07/2018 14:14:14

## 2018-12-10 NOTE — Progress Notes (Signed)
[name] ([id number]) Visit Report for 12/07/2018 Chief Complaint Document Details Patient Name: [name], [name] Date of Service: 12/07/2018 2:15 PM Medical Record Number: [id number] Patient Account Number: [id number] Date of Birth/Sex: [birth year] (43 y.o. F) Treating RN: Harold Barban Primary Care Provider: Priscille Kluver Other Clinician: Referring Provider: Priscille Kluver Treating Provider/Extender: Melburn Hake, Gisele Pack Weeks in Treatment: 17 Information Obtained from: Patient Chief Complaint Left great toe ulcer Electronic Signature(s) Signed: 12/09/2018 7:15:48 AM By: Worthy Keeler PA-C Entered By: Worthy Keeler on 12/07/2018 14:07:44 [name] ([id number]) -------------------------------------------------------------------------------- Debridement Details Patient Name: [name] A. Date of Service: 12/07/2018 2:15 PM Medical Record Number: [id number] Patient Account Number: [id number] Date of Birth/Sex: 07-Aug-1976 (43 y.o. F) Treating RN: Harold Barban Primary Care Provider: Priscille Kluver Other Clinician: Referring Provider: Priscille Kluver Treating Provider/Extender: STONE III, Alyssabeth Bruster Weeks in Treatment: 17 Debridement Performed for Wound #5 Left Toe Great Assessment: Performed By: Physician STONE III, Lukisha Procida E., PA-C Debridement Type: Debridement Severity of Tissue Pre Fat layer exposed Debridement: Level of Consciousness (Pre- Awake and Alert procedure): Pre-procedure Verification/Time Yes - 15:10 Out Taken: Start Time: 15:10 Pain Control: Lidocaine Total Area Debrided (L x W): 1 (cm) x 0.6 (cm) = 0.6 (cm) Tissue and other material Viable, Non-Viable, Callus, Slough, Subcutaneous, Biofilm, Slough debrided: Level: Skin/Subcutaneous Tissue Debridement Description: Excisional Instrument: Curette Bleeding: Minimum Hemostasis Achieved: Pressure End Time: 15:16 Procedural Pain: 0 Post Procedural Pain: 0 Response to Treatment: Procedure was tolerated  well Level of Consciousness Awake and Alert (Post-procedure): Post Debridement Measurements of Total Wound Length: (cm) 1.3 Width: (cm) 1.8 Depth: (cm) 0.3 Volume: (cm) 0.551 Character of Wound/Ulcer Post Debridement: Improved Severity of Tissue Post Debridement: Fat layer exposed Post Procedure Diagnosis Same as Pre-procedure Electronic Signature(s) Signed: 12/07/2018 5:00:03 PM By: Harold Barban Signed: 12/09/2018 7:15:48 AM By: Worthy Keeler PA-C Entered By: Harold Barban on 12/07/2018 17:00:03 [name], [name] ([id number]) -------------------------------------------------------------------------------- HPI Details Patient Name: [name] A. Date of Service: 12/07/2018 2:15 PM Medical Record Number: [id number] Patient Account Number: [id number] Date of Birth/Sex: 1975-10-25 (43 y.o. F) Treating RN: Harold Barban Primary Care Provider: Priscille Kluver Other Clinician: Referring Provider: Priscille Kluver Treating Provider/Extender: Melburn Hake, Kaylor Simenson Weeks in Treatment: 17 History of Present Illness HPI Description: 01/18/18-She is here for initial evaluation of the left great toe ulcer. She is a poor historian in regards to timeframe in detail. She states approximately 4 weeks ago she lacerated her toe on something in the house. She followed up with her primary care who placed her on Bactrim and ultimately a second dose of Bactrim prior to coming to wound clinic. She states she has been treating the toe with peroxide, Betadine and a Band-Aid. She did not check her blood sugar this morning but checked it yesterday morning it was 327; she is unaware of a recent A1c and there are no current records. She saw Dr. she would've orthopedics last week for an old injury to the left ankle, she states he did not see her toe, nor did she bring it to his attention. She smokes approximately 1 pack cigarettes a day. Her social situation is concerning, she arrives this morning with her mother  who appears extremely intoxicated/under the influence; her mother was asked to leave the room and be monitored by the patient's grandmother. The patient's aunt then accompanied the patient and the room throughout the rest of the appointment. We had a lengthy discussion regarding the deleterious effects of uncontrolled hyperglycemia and smoking as it  relates to wound healing and overall health. She was strongly encouraged to decrease her smoking and get her diabetes under better control. She states she is currently on a diet and has cut down her [facility] consumption. The left toe is erythematous, macerated and slightly edematous with malodor present. The edema in her left foot is below her baseline, there is no erythema streaking. We will treat her with Santyl, doxycycline; we have ordered and xray, culture and provided a Peg assist surgical shoe and cultured the wound. 01/25/18-She is here in follow-up evaluation for a left great toe ulcer and presents with an abscess to her suprapubic area. She states her blood sugars remain elevated, feeling "sick" and if levels are below 250, but she is trying. She has made no attempt to decrease her smoking stating that we "can't take away her food in her cigarettes". She has been compliant with offloading using the PEG assist you. She is using Santyl daily. the culture obtained last week grew staph aureus and Enterococcus faecalis; continues on the doxycycline and Augmentin was added on Monday. The suprapubic area has erythema, no femoral variation, purple discoloration, minimal induration, was accessed with a cotton tip applicator with sanguinopurulent drainage, this was cultured, I suspect the current antibiotic treatment will cover and we will not add anything to her current treatment plan. She was advised to go to urgent care or ER with any change in redness, induration or fever. 02/01/18-She is here in follow-up evaluation for left great toe ulcers and a  new abdominal abscess from last week. She was able to use packing until earlier this week, where she "forgot it was there". She states she was feeling ill with GI symptoms last week and was not taking her antibiotic. She states her glucose levels have been predominantly less than 200, with occasional levels between 200-250. She thinks this was contributing to her GI symptoms as they have resolved without intervention. There continues to be significant laceration to left toe, otherwise it clinically looks stable/improved. There is now less superficial opening to the lateral aspect of the great toe that was residual blister. We will transition to [facility] to all wounds, she will continue her Augmentin. If there is no change or deterioration next week for reculture. 02/08/18-She is here in follow-up evaluation for left great toe ulcer and abdominal ulcer. There is an improvement in both wounds. She has been wrapping her left toe with coban, not by our direction, which has created an area of discoloration to the medial aspect; she has been advised to NOT use coban secondary to her neuropathy. She states her glucose levels have been high over this last week ranging from 200-350, she continues to smoke. She admits to being less compliant with her offloading shoe. We will continue with same treatment plan and she will follow-up next week. 02/15/18-She is here in follow-up evaluation for left great toe ulcer and abdominal ulcer. The abdominal ulcer is epithelialized. The left great toe ulcer is improved and all injury from last week using the Coban wrap is resolved, the lateral ulcer is healed. She admits to noncompliance with wearing offloading shoe and admits to glucose levels being greater than 300 most of the week. She continues to smoke and expresses no desire to quit. There is one area medially that probes deeper than it has historically, erythema to the toe and dorsal foot has consistently waxed  and waned. There is no overt signs of cellulitis or infection but we will culture  the wound for any occult infection given the new area of depth and erythema. We will hold off on sensitivities for initiation of antibiotic therapy. 02/22/18-She is here in follow up evaluation for left great toe ulcer. There is overall significant improvement in both wound appearance, erythema and edema with changes made last week. She was not initiated on antibiotic therapy. Culture obtained last week showed oxacillin sensitive staph aureus, sensitive to clindamycin. Clindamycin has been called into the pharmacy but she has been instructed to hold off on initiation secondary to overall clinical improvement and her history of antibiotic intolerance. She has been instructed to contact the clinic with any noted changes/deterioration and the wound, erythema, Erck, Yared A. ([id number]) edema and/or pain. She will follow-up next week. She continues to smoke and her glucose levels remain elevated >250; she admits to compliance with offloading shoe 03/01/18 on evaluation today patient appears to be doing fairly well in regard to her left first toe ulcer. She has been tolerating the dressing changes with the [facility] Dressing without complication and overall this has definitely showed signs of improvement according to records as well is what the patient tells me today. I'm very pleased in that regard. She is having no pain today 03/08/18 She is here for follow up evaluation of a left great toe ulcer. She remains non-compliant with glucose control and smoking cessation; glucose levels consistently >200. She states that she got new shoe inserts/peg assist. She admits to compliance with offloading. Since my last evaluation there is significant improvement. We will switch to prisma at this time and she will follow up next week. She is noted to be tachycardic at this appointment, heart rate 120s; she has a history of  heart rate 70-130 according to our records. She admits to extreme agitation r/t personal issues; she was advised to monitor her heartrate and contact her physician if it does not return to a more normal range (<100). She takes cardizem twice daily. 03/15/18-She is here in follow-up evaluation for left great toe ulcer. She remains noncompliant with glucose control and smoking cessation. She admits to compliance with wearing offloading shoe. The ulcer is improved/stable and we will continue with the same treatment plan and she will follow-up next week 03/22/18-She is here for evaluation for left great toe ulcer. There continues to be significant improvement despite recurrent hyperglycemia (over 500 yesterday) and she continues to smoke. She has been compliant with offloading and we will continue with same treatment plan and she will follow-up next week. 03/29/18-She is here for evaluation for left great toe ulcer. Despite continuing to smoke and uncontrolled diabetes she continues to improve. She is compliant with offloading shoe. We will continue with the same treatment plan and she will follow-up next week 04/05/18- She is here in follow up evaluation for a left great toe ulcer; she presents with small pustule to left fifth toe (resembles ant bite). She admits to compliance with wearing offloading shoe; continues to smoke or have uncontrolled blood glucose control. There is more callus than usual with evidence of bleeding; she denies known trauma. 04/12/18-She is here for evaluation of left great toe ulcer. Despite noncompliance with glycemic control and smoking she continues to make improvement. She continues to wear offloading shoe. The pustule, that was identified last week, to the left fifth toe is resolved. She will follow-up in 2 weeks 05/03/18-she is seen in follow-up evaluation for a left great toe ulcer. She is compliant with offloading, otherwise noncompliant with glycemic control  and smoking.  She has plateaued and there is minimal improvement noted. We will transition to [facility], replaced the insert to her surgical shoe and she will follow-up in one week 05/10/18- She is here in follow up evaluation for a left great toe ulcer. It appears stable despite measurement change. We will continue with same treatment plan and follow up next week. 05/24/18-She is seen in follow-up evaluation for a left great toe ulcer. She remains compliant with offloading, has made significant improvement in her diet, decreasing the amount of sugar/soda. She said her recent A1c was 10.9 which is lower than. She did see a diabetic nutritionist/educator yesterday. She continues to smoke. We will continue with the same treatment plan and she'll follow-up next week. 05/31/18- She is seen in follow-up evaluation for left great toe ulcer. She continues to remain compliant with offloading, continues to make improvement in her diet, increasing her water and decreasing the amount of sugar/soda. She does continue to smoke with no desire to quit. We will apply Prisma to the depth and Hydrofera Blue over. We have not received insurance authorization for oasis. She will follow up next week. 06/07/18-She is seen in follow-up evaluation for left great toe ulcer. It has stalled according to today's measurements although base appears stable. She says she saw a diabetic educator yesterday; her average blood sugars are less than 300 which is an improvement for her. She continues to smoke and states "that's my next step" She continues with water over soda. We will order for xray, culture and reinstate ace wrap compression prior to placing apligraf for next week. She is voicing no complaints or concerns. Her dressing will change to iodoflex over the next week in preparation for apligraf. 06/14/18-She is seen in follow-up evaluation for left great toe ulcer. Plain film x-ray performed last week was negative for osteomyelitis. Wound  culture obtained last week grew strep B and OSSA; she is initiated on keflex and cefdinir today; there is erythema to the toe which could be from ace wrap compression, she has a history of wrapping too tight and has has been encouraged to maintain ace wraps that we place today. We will hold off on application of apligraf today, will apply next week after antibiotic therapy has been initiated. She admits today that she has resumed taking a shower with her foot/toe submerged in water, she has been reminded to keep foot/toe out of the bath water. She will be seen in follow up next week 06/21/18-she is seen in follow-up evaluation for left great toe ulcer. She is tolerating antibiotic therapy with no GI disturbance. The wound is stable. Apligraf was applied today. She has been decreasing her smoking, only had 4 cigarettes yesterday and 1 today. She continues being more compliant in diabetic diet. She will follow-up next week for evaluation of site, if stable will remove at 2 weeks. 06/28/18- She is here in follow up evalution. Apligraf was placed last week, she states the dressing fell off on Tuesday and she was dressing with hydrofera blue. She is healed and will be discharged from the clinic today. She has been instructed to continue with smoking cessation, continue monitoring glucose levels, offloading for an additional 4 weeks and continue with Lebow, Kimmi A. ([id number]) hydrofera blue for additional two weeks for any possible microscopic opening. Readmission: 08/07/18 on evaluation today patient presents for reevaluation concerning the ulcer of her right great toe. She was previously discharged on 06/28/18 healed. Nonetheless she states that this began to  show signs of drainage she subsequently went to her primary care provider. Subsequently an x-ray was performed on 08/01/18 which was negative. The patient was also placed on antibiotics at that time. Fortunately they should have been effective for  the infection. Nonetheless she's been experiencing some improvement but still has a lot of drainage coming from the wound itself. 08/14/18 on evaluation today patient's wound actually does show signs of improvement in regard to the erythema at this point. She has completed the antibiotics. With that being said we did discuss the possibility of placing her in a total contact cast as of today although I think that I may want to give this just a little bit more time to ensure nothing recurrence as far as her infection is concerned. I do not want to put in the cast and risk infection at that time if things are not completely resolved. With that being said she is gonna require some debridement today. 08/21/18 on evaluation today patient actually appears to be doing okay in regard to her toe ulcer. She's been tolerating the dressing changes without complication. With that being said it does appear that she is ready and in fact I think it's appropriate for [place] to go ahead and initiate the total contact cast today. Nonetheless she will require some sharp debridement to prepare the wound for application. Overall I feel like things have been progressing well but we do need to do something to get this to close more readily. 08/24/18 patient seen today for reevaluation after having had the total contact cast applied on Tuesday. She seems to have done very well the wound appears to be doing great and overall I'm pleased with the progress that she's made. There were no abnormal areas of rubbing from the cast on her lower extremity. 08/30/18 on evaluation today patient actually appears to be completely healed in regard to her plantar toe ulcer. She tells me at this point she's been having a lot of issues with the cast. She almost fell a couple of times the state shall the step of her dog a couple times as well. This is been a very frustrating process for her other nonetheless she has completely healed the  wound which is excellent news. Overall there does not appear to be the evidence of infection at this time which is great news. 09/11/18 evaluation today patient presents for follow-up concerning her great toe ulcer on the left which has unfortunately reopened since I last saw her which was only a couple of weeks ago. Unfortunately she was not able to get in to get the shoe and potentially the AFO that's gonna be necessary due to her left foot drop. She continues with offloading shoe but this is not enough to prevent her from reopening it appears. When we last had her in the total contact cast she did well from a healing standpoint but unfortunately the wound reopened as soon as she came out of the cast within just a couple of weeks. 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 This is also part of the reason why she falls on a regular basis. I just do not believe that is gonna be safe for [place] to reinitiate the total contact cast as last time we had this on  she fell 3 times one week which is definitely not normal for her. 09/18/18 upon evaluation today the patient actually appears to be doing about the same in regard to her toe ulcer. She did not contact [facility] as I asked her to even though I had given her the prescription. In fact she actually states that she has no idea where the prescription is. She did apparently call [facility] and they told her that all she needed to do was bring the prescription in order to be able to be seen and work on getting the AFO for her left foot. With all that being said she still does not have an appointment and I'm not sure were things stand that regard. I will give her a new prescription today in order to contact them to get this set up. 09/25/18 on evaluation today patient  actually appears to be doing about the same in regard to her toes ulcer. She does have a small areas which seems to have a lot of callous buildup around the edge of the wound which is going to need sharp debridement today. She still is waiting to be scheduled for evaluation with [facility] for possibility of an AFO. She states there supposed to call her tomorrow to get this set up. Unfortunately it does appear that her foot specifically the toe area is showing signs of erythema. There does not appear to be any systemic infection which is in these good news. 10/02/18 on evaluation today patient actually appears to be doing about the same in regard to her toe ulcer. This really has not done too well although it's not significantly larger it's also not significantly smaller. She has been tolerating the dressing changes without complication. She actually has her appointment with [facility] and [facility] tomorrow to hopefully be measured for obtaining and AFO splint. I think this would be helpful preventing this from reoccurring. We had contemplated starting the cast this week although to be honest I am reluctant to do that as she's been having nausea, vomiting, and seizure activity over the past three days. She has a history of seizures and have been told is nothing that can be done for these. With Lahaie, Melenda A. ([id number]) that being said I do believe that along with the seizures have the nausea vomiting which upon further questioning doesn't seem to be the normal for her and makes me concerned for the possibility of infection or something else going on. I discussed this with the patient and her mother during the office visit today. I do not feel the wound is effective but maybe something else. The responses this was "this just happens to her at times and we don't know why". They did not seem to be interested in going to the hospital to have this checked out further. 10/09/18 on evaluation today patient  presents for follow-up concerning her ongoing toe ulcer. She has been tolerating the dressing changes without complication. Fortunately there does not appear to be any evidence of infection which is great news however I do think that the patient would benefit from going ahead for with the total contact cast. She's actually in a wheelchair today she tells me that she will use her walker if we initiate the cast. I was very specific about the fact that if we were gonna do the cast I wanted to make sure that she was using the walker in order to prevent any falls. She tells me she does not have stairs that she has to  traverse on a regular basis at her home. She has not had any seizures since last week again that something that happens to her often she tells me she did talk to [employer] and they said that it may take up to three weeks to get the brace approved for her. Hopefully that will not take that long but nonetheless in the meantime I do think the cast could be of benefit. 10/12/18 on evaluation today patient appears to be doing rather well in regard to her toe ulcer. It's just been a few days and already this is significantly improved both as far as overall appearance and size. Fortunately there's no sign of infection. She is here for her first obligatory cast change. 10/19/18 Seen today for follow up and management of left great toe ulcer. Wound continues to show improvement. Noted small open area with seroussang drainage with palpation. Denies any increased pain or recent fevers during visit. She will continue calcium alginate with offloading shoe. Denies any questions or concerns during visit. 10/26/18 on evaluation today patient appears to be doing about the same as when I last saw her in regard to her wound bed. Fortunately there does not appear to be any signs of infection. Unfortunately she continues to have a breakdown in regard to the toe region any time that she is not in the cast. It takes  almost no time at all for this to happen. Nonetheless she still has not heard anything from the brace being made by [employer] as to when exactly this will be available to her. Fortunately there is no signs of infection at this time. 10/30/18 on evaluation today patient presents for application of the total contact cast as we just received him this morning. Fortunately we are gonna be able to apply this to her today which is great news. She continues to have no significant pain which is good news. Overall I do feel like things have been improving while she was the cast is when she doesn't have a cast that things get worse. She still has not really heard anything from [place] regarding her brace. 11/02/18 upon evaluation today patient's wound already appears to be doing significantly better which is good news. Fortunately there does not appear to be any signs of infection also good news. Overall I do think the total contact cast as before is helping to heal this area unfortunately it's just not gonna likely keep the area closed and healed without her getting her brace at least. Again the foot drop is a significant issue for her. 11/09/18 on evaluation today patient appears to be doing excellent in regard to her toe ulcer which in fact is completely healed. Fortunately we finally got the situation squared away with the paperwork which was needed to proceed with getting her brace approved by Medicaid. I have filled that out unfortunately that information has been sent to the orthopedic office that I worked at 2 1/2 years ago and not tired Current wound care measures. Fortunately she seems to be doing very well at this time. 11/23/18 on evaluation today patient appears to be doing More Poorly Compared to Last Time I Saw Her. At [facility] She Had Completely Healed. Currently she is continuing to have issues with reopening. She states that she just found out that the brace was approved through Medicaid now she  just has to go get measured in order to have this fitted for her and then made. Subsequently she does not have an appointment for  this yet that is going to complicate things we obviously cannot put her back in the cast if we do not have everything measured because they're not gonna be able to measure her foot while she is in the cast. 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 Obviously this is unfortunate and does have me somewhat worried as well. 11/30/18 on evaluation today patient's toe ulcer actually appears to be doing fairly well. The good news is she will be getting her brace in the shoes next week on Wednesday. Hopefully we will be able to get this to heal without having to go back in the cast however she may need the cast in order to get the wound completely heal and then go from there. Fortunately there's no signs of infection at this time. 12/07/18 on evaluation today patient fortunately did receive her brace and she states she could tell this definitely makes her walk better. With that being said she's been having issues with her toe where she noticed yesterday there was a lot of tissue Peri, Mayerly A. ([id number]) that was loosing off this appears to be much larger than what it was previous. She also states that her leg has been read putting much across the top of her foot just about the ankle although this seems to be receiving somewhat. The total area is still red and appears to be someone infected as best I can tell. She is previously taken Bactrim and that may be a good option for her today as well. We are gonna see what I wound culture shows as well and I think that this is definitely appropriate. With that being said outside of the culture I still need to initiate something in the interim and that's what I'm gonna go ahead and select Bactrim is a good option for her. Electronic  Signature(s) Signed: 12/09/2018 7:15:48 AM By: Worthy Keeler PA-C Entered By: Worthy Keeler on 12/07/2018 15:22:51 [name] ([id number]) -------------------------------------------------------------------------------- Physical Exam Details Patient Name: [name] A. Date of Service: 12/07/2018 2:15 PM Medical Record Number: [id number] Patient Account Number: [id number] Date of Birth/Sex: [birth year] (43 y.o. F) Treating RN: Harold Barban Primary Care Provider: Priscille Kluver Other Clinician: Referring Provider: Priscille Kluver Treating Provider/Extender: STONE III, Roxana Lai Weeks in Treatment: 64 Constitutional Well-nourished and well-hydrated in no acute distress. Respiratory normal breathing without difficulty. clear to auscultation bilaterally. Cardiovascular regular rate and rhythm with normal S1, S2. Psychiatric this patient is able to make decisions and demonstrates good insight into disease process. Alert and Oriented x 3. pleasant and cooperative. Notes Patient's wound bed currently shows a significant amount of necrotic tissue on the surface of the wound including some callous which is actually lifting off. She also has your theme of the great toe really I'm not seeing much spreading up her foot although this apparently was the case just a couple of days ago. With that being said I'm definitely concerned about infection at this point. Post debridement the wound bed appears to be better although it is significantly larger compared to last week. Electronic Signature(s) Signed: 12/09/2018 7:15:48 AM By: Worthy Keeler PA-C Entered By: Worthy Keeler on 12/07/2018 15:23:28 [name] ([id number]) -------------------------------------------------------------------------------- Physician Orders Details Patient Name: [name] A. Date of Service: 12/07/2018 2:15 PM Medical Record Number: [id number] Patient Account Number: [id number] Date of Birth/Sex: 1976-07-30  (43 y.o. 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 Anesthetic (add to Medication List) Wound #5 Left Toe Great o Topical Lidocaine 4% cream applied to wound bed prior to debridement (In Clinic Only). Primary Wound Dressing Wound #5 Left Toe Great o Silver Alginate - Gentamicin on wound bed Secondary Dressing Wound #5 Left Toe Great o Conform/Kerlix - foam Dressing Change Frequency Wound #5 Left Toe Great o Three times weekly Follow-up Appointments Wound #5 Left Toe Great o Return Appointment in 1 week. Medications-please add to medication list. Wound #5 Left Toe Great o P.O. Antibiotics - Finish all antibiotics o Topical Antibiotic Laboratory o Bacteria identified in Wound by Culture (MICRO) - Left Great Toe oooo LOINC Code: [phone] oooo Convenience Name: Wound culture routine Benard, Wavie A. ([id number]) Patient Medications Allergies: erythromycin base Notifications Medication Indication Start End gentamicin 12/07/2018 DOSE topical 0.1 % cream - cream topical applied in a thin film to the wound bed then cover with dressing as directed. Bactrim DS 12/07/2018 DOSE 1 - oral 800 mg-160 mg tablet - 1 tablet oral taken 2 times a day for 14 days Electronic Signature(s) Signed: 12/07/2018 3:25:57 PM By: Worthy Keeler PA-C Entered By: Worthy Keeler on 12/07/2018 15:25:57 Torosian, Bless A. ([id number]) -------------------------------------------------------------------------------- Problem List  Details Patient Name: [name] A. Date of Service: 12/07/2018 2:15 PM Medical Record Number: [id number] Patient Account Number: [id number] Date of Birth/Sex: [birth year] (43 y.o. F) Treating RN: Harold Barban Primary Care Provider: Priscille Kluver Other Clinician: Referring Provider: Priscille Kluver Treating Provider/Extender: Melburn Hake, Chantella Creech Weeks in Treatment: 17 Active Problems ICD-10 Evaluated Encounter Code Description Active Date Today Diagnosis E11.621 Type 2 diabetes mellitus with foot ulcer 08/07/2018 No Yes L97.522 Non-pressure chronic ulcer of other part of left foot with fat 08/08/2018 No Yes layer exposed M21.372 Foot drop, left foot 09/11/2018 No Yes Inactive Problems Resolved Problems ICD-10 Code Description Active Date Resolved Date L03.032 Cellulitis of left toe 08/07/2018 08/07/2018 Electronic Signature(s) Signed: 12/09/2018 7:15:48 AM By: Worthy Keeler PA-C Entered By: Worthy Keeler on 12/07/2018 14:07:38 Mcintyre, Ziana A. ([id number]) -------------------------------------------------------------------------------- Progress Note Details Patient Name: [name] A. Date of Service: 12/07/2018 2:15 PM Medical Record Number: [id number] Patient Account Number: [id number] Date of Birth/Sex: 1976-08-21 (43 y.o. F) Treating RN: Harold Barban Primary Care Provider: Priscille Kluver Other Clinician: Referring Provider: Priscille Kluver Treating Provider/Extender: Melburn Hake, Amisadai Woodford Weeks in Treatment: 17 Subjective Chief Complaint Information obtained from Patient Left great toe ulcer History of Present Illness (HPI) 01/18/18-She is here for initial evaluation of the left great toe ulcer. She is a poor historian in regards to timeframe in detail. She states approximately 4 weeks ago she lacerated her toe on something in the house. She followed up with her primary care who placed her on Bactrim and ultimately a second dose of Bactrim prior to coming to wound clinic. She  states she has been treating the toe with peroxide, Betadine and a Band-Aid. She did not check her blood sugar this morning but checked it yesterday morning it was 327; she is unaware of a recent A1c and there are no current records.  She saw Dr. she would've orthopedics last week for an old injury to the left ankle, she states he did not see her toe, nor did she bring it to his attention. She smokes approximately 1 pack cigarettes a day. Her social situation is concerning, she arrives this morning with her mother who appears extremely intoxicated/under the influence; her mother was asked to leave the room and be monitored by the patient's grandmother. The patient's aunt then accompanied the patient and the room throughout the rest of the appointment. We had a lengthy discussion regarding the deleterious effects of uncontrolled hyperglycemia and smoking as it relates to wound healing and overall health. She was strongly encouraged to decrease her smoking and get her diabetes under better control. She states she is currently on a diet and has cut down her [facility] consumption. The left toe is erythematous, macerated and slightly edematous with malodor present. The edema in her left foot is below her baseline, there is no erythema streaking. We will treat her with Santyl, doxycycline; we have ordered and xray, culture and provided a Peg assist surgical shoe and cultured the wound. 01/25/18-She is here in follow-up evaluation for a left great toe ulcer and presents with an abscess to her suprapubic area. She states her blood sugars remain elevated, feeling "sick" and if levels are below 250, but she is trying. She has made no attempt to decrease her smoking stating that we "can't take away her food in her cigarettes". She has been compliant with offloading using the PEG assist you. She is using Santyl daily. the culture obtained last week grew staph aureus and Enterococcus faecalis; continues on the  doxycycline and Augmentin was added on Monday. The suprapubic area has erythema, no femoral variation, purple discoloration, minimal induration, was accessed with a cotton tip applicator with sanguinopurulent drainage, this was cultured, I suspect the current antibiotic treatment will cover and we will not add anything to her current treatment plan. She was advised to go to urgent care or ER with any change in redness, induration or fever. 02/01/18-She is here in follow-up evaluation for left great toe ulcers and a new abdominal abscess from last week. She was able to use packing until earlier this week, where she "forgot it was there". She states she was feeling ill with GI symptoms last week and was not taking her antibiotic. She states her glucose levels have been predominantly less than 200, with occasional levels between 200-250. She thinks this was contributing to her GI symptoms as they have resolved without intervention. There continues to be significant laceration to left toe, otherwise it clinically looks stable/improved. There is now less superficial opening to the lateral aspect of the great toe that was residual blister. We will transition to [facility] to all wounds, she will continue her Augmentin. If there is no change or deterioration next week for reculture. 02/08/18-She is here in follow-up evaluation for left great toe ulcer and abdominal ulcer. There is an improvement in both wounds. She has been wrapping her left toe with coban, not by our direction, which has created an area of discoloration to the medial aspect; she has been advised to NOT use coban secondary to her neuropathy. She states her glucose levels have been high over this last week ranging from 200-350, she continues to smoke. She admits to being less compliant with her offloading shoe. We will continue with same treatment plan and she will follow-up next week. 02/15/18-She is here in follow-up evaluation for  left  great toe ulcer and abdominal ulcer. The abdominal ulcer is epithelialized. The left great toe ulcer is improved and all injury from last week using the Coban wrap is resolved, the lateral ulcer is healed. She admits to noncompliance with wearing offloading shoe and admits to glucose levels being greater than 300 most of the week. She continues to smoke and expresses no desire to quit. There is one area medially that probes deeper than it has historically, erythema to the toe and dorsal foot has consistently waxed and waned. There is no overt signs of cellulitis or infection but we will culture the wound for any occult infection given the new area of depth and erythema. We will hold off on Smartt, Beyza A. ([id number]) sensitivities for initiation of antibiotic therapy. 02/22/18-She is here in follow up evaluation for left great toe ulcer. There is overall significant improvement in both wound appearance, erythema and edema with changes made last week. She was not initiated on antibiotic therapy. Culture obtained last week showed oxacillin sensitive staph aureus, sensitive to clindamycin. Clindamycin has been called into the pharmacy but she has been instructed to hold off on initiation secondary to overall clinical improvement and her history of antibiotic intolerance. She has been instructed to contact the clinic with any noted changes/deterioration and the wound, erythema, edema and/or pain. She will follow-up next week. She continues to smoke and her glucose levels remain elevated >250; she admits to compliance with offloading shoe 03/01/18 on evaluation today patient appears to be doing fairly well in regard to her left first toe ulcer. She has been tolerating the dressing changes with the [facility] Dressing without complication and overall this has definitely showed signs of improvement according to records as well is what the patient tells me today. I'm very pleased in that regard. She is  having no pain today 03/08/18 She is here for follow up evaluation of a left great toe ulcer. She remains non-compliant with glucose control and smoking cessation; glucose levels consistently >200. She states that she got new shoe inserts/peg assist. She admits to compliance with offloading. Since my last evaluation there is significant improvement. We will switch to prisma at this time and she will follow up next week. She is noted to be tachycardic at this appointment, heart rate 120s; she has a history of heart rate 70-130 according to our records. She admits to extreme agitation r/t personal issues; she was advised to monitor her heartrate and contact her physician if it does not return to a more normal range (<100). She takes cardizem twice daily. 03/15/18-She is here in follow-up evaluation for left great toe ulcer. She remains noncompliant with glucose control and smoking cessation. She admits to compliance with wearing offloading shoe. The ulcer is improved/stable and we will continue with the same treatment plan and she will follow-up next week 03/22/18-She is here for evaluation for left great toe ulcer. There continues to be significant improvement despite recurrent hyperglycemia (over 500 yesterday) and she continues to smoke. She has been compliant with offloading and we will continue with same treatment plan and she will follow-up next week. 03/29/18-She is here for evaluation for left great toe ulcer. Despite continuing to smoke and uncontrolled diabetes she continues to improve. She is compliant with offloading shoe. We will continue with the same treatment plan and she will follow-up next week 04/05/18- She is here in follow up evaluation for a left great toe ulcer; she presents with small pustule to left fifth  toe (resembles ant bite). She admits to compliance with wearing offloading shoe; continues to smoke or have uncontrolled blood glucose control. There is more callus than usual  with evidence of bleeding; she denies known trauma. 04/12/18-She is here for evaluation of left great toe ulcer. Despite noncompliance with glycemic control and smoking she continues to make improvement. She continues to wear offloading shoe. The pustule, that was identified last week, to the left fifth toe is resolved. She will follow-up in 2 weeks 05/03/18-she is seen in follow-up evaluation for a left great toe ulcer. She is compliant with offloading, otherwise noncompliant with glycemic control and smoking. She has plateaued and there is minimal improvement noted. We will transition to [facility], replaced the insert to her surgical shoe and she will follow-up in one week 05/10/18- She is here in follow up evaluation for a left great toe ulcer. It appears stable despite measurement change. We will continue with same treatment plan and follow up next week. 05/24/18-She is seen in follow-up evaluation for a left great toe ulcer. She remains compliant with offloading, has made significant improvement in her diet, decreasing the amount of sugar/soda. She said her recent A1c was 10.9 which is lower than. She did see a diabetic nutritionist/educator yesterday. She continues to smoke. We will continue with the same treatment plan and she'll follow-up next week. 05/31/18- She is seen in follow-up evaluation for left great toe ulcer. She continues to remain compliant with offloading, continues to make improvement in her diet, increasing her water and decreasing the amount of sugar/soda. She does continue to smoke with no desire to quit. We will apply Prisma to the depth and Hydrofera Blue over. We have not received insurance authorization for oasis. She will follow up next week. 06/07/18-She is seen in follow-up evaluation for left great toe ulcer. It has stalled according to today's measurements although base appears stable. She says she saw a diabetic educator yesterday; her average blood sugars are less  than 300 which is an improvement for her. She continues to smoke and states "that's my next step" She continues with water over soda. We will order for xray, culture and reinstate ace wrap compression prior to placing apligraf for next week. She is voicing no complaints or concerns. Her dressing will change to iodoflex over the next week in preparation for apligraf. 06/14/18-She is seen in follow-up evaluation for left great toe ulcer. Plain film x-ray performed last week was negative for osteomyelitis. Wound culture obtained last week grew strep B and OSSA; she is initiated on keflex and cefdinir today; there is erythema to the toe which could be from ace wrap compression, she has a history of wrapping too tight and has has been encouraged to maintain ace wraps that we place today. We will hold off on application of apligraf today, will apply next week after antibiotic therapy has been initiated. She admits today that she has resumed taking a shower with her foot/toe submerged in water, she has been reminded to keep foot/toe out of the bath water. She will be seen in follow up next week 06/21/18-she is seen in follow-up evaluation for left great toe ulcer. She is tolerating antibiotic therapy with no GI disturbance. The wound is stable. Apligraf was applied today. She has been decreasing her smoking, only had 4 cigarettes yesterday and Duecker, Maryclaire A. ([id number]) 1 today. She continues being more compliant in diabetic diet. She will follow-up next week for evaluation of site, if stable will remove at  2 weeks. 06/28/18- She is here in follow up evalution. Apligraf was placed last week, she states the dressing fell off on Tuesday and she was dressing with hydrofera blue. She is healed and will be discharged from the clinic today. She has been instructed to continue with smoking cessation, continue monitoring glucose levels, offloading for an additional 4 weeks and continue with hydrofera blue for  additional two weeks for any possible microscopic opening. Readmission: 08/07/18 on evaluation today patient presents for reevaluation concerning the ulcer of her right great toe. She was previously discharged on 06/28/18 healed. Nonetheless she states that this began to show signs of drainage she subsequently went to her primary care provider. Subsequently an x-ray was performed on 08/01/18 which was negative. The patient was also placed on antibiotics at that time. Fortunately they should have been effective for the infection. Nonetheless she's been experiencing some improvement but still has a lot of drainage coming from the wound itself. 08/14/18 on evaluation today patient's wound actually does show signs of improvement in regard to the erythema at this point. She has completed the antibiotics. With that being said we did discuss the possibility of placing her in a total contact cast as of today although I think that I may want to give this just a little bit more time to ensure nothing recurrence as far as her infection is concerned. I do not want to put in the cast and risk infection at that time if things are not completely resolved. With that being said she is gonna require some debridement today. 08/21/18 on evaluation today patient actually appears to be doing okay in regard to her toe ulcer. She's been tolerating the dressing changes without complication. With that being said it does appear that she is ready and in fact I think it's appropriate for [place] to go ahead and initiate the total contact cast today. Nonetheless she will require some sharp debridement to prepare the wound for application. Overall I feel like things have been progressing well but we do need to do something to get this to close more readily. 08/24/18 patient seen today for reevaluation after having had the total contact cast applied on Tuesday. She seems to have done very well the wound appears to be doing great and  overall I'm pleased with the progress that she's made. There were no abnormal areas of rubbing from the cast on her lower extremity. 08/30/18 on evaluation today patient actually appears to be completely healed in regard to her plantar toe ulcer. She tells me at this point she's been having a lot of issues with the cast. She almost fell a couple of times the state shall the step of her dog a couple times as well. This is been a very frustrating process for her other nonetheless she has completely healed the wound which is excellent news. Overall there does not appear to be the evidence of infection at this time which is great news. 09/11/18 evaluation today patient presents for follow-up concerning her great toe ulcer on the left which has unfortunately reopened since I last saw her which was only a couple of weeks ago. Unfortunately she was not able to get in to get the shoe and potentially the AFO that's gonna be necessary due to her left foot drop. She continues with offloading shoe but this is not enough to prevent her from reopening it appears. When we last had her in the total contact cast she did well from a healing standpoint  but unfortunately the wound reopened as soon as she came out of the cast within just a couple of weeks. 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 This is also part of the reason why she falls on a regular basis. I just do not believe that is gonna be safe for [place] to reinitiate the total contact cast as last time we had this on she fell 3 times one week which is definitely not normal for her. 09/18/18 upon evaluation today the patient actually appears to be doing about the same in regard to her toe ulcer. She did not contact [facility] as I asked her to even though I had given her  the prescription. In fact she actually states that she has no idea where the prescription is. She did apparently call [facility] and they told her that all she needed to do was bring the prescription in order to be able to be seen and work on getting the AFO for her left foot. With all that being said she still does not have an appointment and I'm not sure were things stand that regard. I will give her a new prescription today in order to contact them to get this set up. 09/25/18 on evaluation today patient actually appears to be doing about the same in regard to her toes ulcer. She does have a small areas which seems to have a lot of callous buildup around the edge of the wound which is going to need sharp debridement today. She still is waiting to be scheduled for evaluation with [facility] for possibility of an AFO. She states there supposed to call her tomorrow to get this set up. Unfortunately it does appear that her foot specifically the toe area is showing signs of erythema. There does not appear to be any systemic infection which is in these good news. DORENE, BRUNI A. ([id number]) 10/02/18 on evaluation today patient actually appears to be doing about the same in regard to her toe ulcer. This really has not done too well although it's not significantly larger it's also not significantly smaller. She has been tolerating the dressing changes without complication. She actually has her appointment with [facility] and [place] tomorrow to hopefully be measured for obtaining and AFO splint. I think this would be helpful preventing this from reoccurring. We had contemplated starting the cast this week although to be honest I am reluctant to do that as she's been having nausea, vomiting, and seizure activity over the past three days. She has a history of seizures and have been told is nothing that can be done for these. With that being said I do believe that along with the seizures have the nausea vomiting  which upon further questioning doesn't seem to be the normal for her and makes me concerned for the possibility of infection or something else going on. I discussed this with the patient and her mother during the office visit today. I do not feel the wound is effective but maybe something else. The responses this was "this just happens to her at times and we don't know why". They did not seem to be interested in going to the hospital to have this checked out further. 10/09/18 on evaluation today patient presents for follow-up concerning her ongoing toe ulcer. She  has been tolerating the dressing changes without complication. Fortunately there does not appear to be any evidence of infection which is great news however I do think that the patient would benefit from going ahead for with the total contact cast. She's actually in a wheelchair today she tells me that she will use her walker if we initiate the cast. I was very specific about the fact that if we were gonna do the cast I wanted to make sure that she was using the walker in order to prevent any falls. She tells me she does not have stairs that she has to traverse on a regular basis at her home. She has not had any seizures since last week again that something that happens to her often she tells me she did talk to [employer] and they said that it may take up to three weeks to get the brace approved for her. Hopefully that will not take that long but nonetheless in the meantime I do think the cast could be of benefit. 10/12/18 on evaluation today patient appears to be doing rather well in regard to her toe ulcer. It's just been a few days and already this is significantly improved both as far as overall appearance and size. Fortunately there's no sign of infection. She is here for her first obligatory cast change. 10/19/18 Seen today for follow up and management of left great toe ulcer. Wound continues to show improvement. Noted small open area  with seroussang drainage with palpation. Denies any increased pain or recent fevers during visit. She will continue calcium alginate with offloading shoe. Denies any questions or concerns during visit. 10/26/18 on evaluation today patient appears to be doing about the same as when I last saw her in regard to her wound bed. Fortunately there does not appear to be any signs of infection. Unfortunately she continues to have a breakdown in regard to the toe region any time that she is not in the cast. It takes almost no time at all for this to happen. Nonetheless she still has not heard anything from the brace being made by [employer] as to when exactly this will be available to her. Fortunately there is no signs of infection at this time. 10/30/18 on evaluation today patient presents for application of the total contact cast as we just received him this morning. Fortunately we are gonna be able to apply this to her today which is great news. She continues to have no significant pain which is good news. Overall I do feel like things have been improving while she was the cast is when she doesn't have a cast that things get worse. She still has not really heard anything from [place] regarding her brace. 11/02/18 upon evaluation today patient's wound already appears to be doing significantly better which is good news. Fortunately there does not appear to be any signs of infection also good news. Overall I do think the total contact cast as before is helping to heal this area unfortunately it's just not gonna likely keep the area closed and healed without her getting her brace at least. Again the foot drop is a significant issue for her. 11/09/18 on evaluation today patient appears to be doing excellent in regard to her toe ulcer which in fact is completely healed. Fortunately we finally got the situation squared away with the paperwork which was needed to proceed with getting her brace approved by Medicaid. I have  filled that out unfortunately that information has been sent  to the orthopedic office that I worked at 2 1/2 years ago and not tired Current wound care measures. Fortunately she seems to be doing very well at this time. 11/23/18 on evaluation today patient appears to be doing More Poorly Compared to Last Time I Saw Her. At [facility] She Had Completely Healed. Currently she is continuing to have issues with reopening. She states that she just found out that the brace was approved through Medicaid now she just has to go get measured in order to have this fitted for her and then made. Subsequently she does not have an appointment for this yet that is going to complicate things we obviously cannot put her back in the cast if we do not have everything measured because they're not gonna be able to measure her foot while she is in the cast. 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 Obviously this is unfortunate and does have me somewhat worried as well. 11/30/18 on evaluation today patient's toe ulcer actually appears to be doing fairly well. The good news is she will be getting her Canedo, Yatziri A. ([id number]) brace in the shoes next week on Wednesday. Hopefully we will be able to get this to heal without having to go back in the cast however she may need the cast in order to get the wound completely heal and then go from there. Fortunately there's no signs of infection at this time. 12/07/18 on evaluation today patient fortunately did receive her brace and she states she could tell this definitely makes her walk better. With that being said she's been having issues with her toe where she noticed yesterday there was a lot of tissue that was loosing off this appears to be much larger than what it was previous. She also states that her leg has been read putting much across the top of her foot just about the  ankle although this seems to be receiving somewhat. The total area is still red and appears to be someone infected as best I can tell. She is previously taken Bactrim and that may be a good option for her today as well. We are gonna see what I wound culture shows as well and I think that this is definitely appropriate. With that being said outside of the culture I still need to initiate something in the interim and that's what I'm gonna go ahead and select Bactrim is a good option for her. Patient History Information obtained from Patient. Social History Current every day smoker. Medical History Eyes Patient has history of Cataracts, Glaucoma, Optic Neuritis Ear/Nose/Mouth/Throat Patient has history of Chronic sinus problems/congestion, Middle ear problems Endocrine Patient has history of Type II Diabetes Review of Systems (ROS) Constitutional Symptoms ([place]) Denies complaints or symptoms of Fever, Chills. Respiratory The patient has no complaints or symptoms. Cardiovascular The patient has no complaints or symptoms. Psychiatric The patient has no complaints or symptoms. Objective Constitutional Well-nourished and well-hydrated in no acute distress. Vitals Time Taken: 2:14 PM, Height: 69 in, Weight: 240 lbs, BMI: 35.4, Temperature: 98.6 F, Pulse: 99 bpm, Respiratory Rate: 16 breaths/min, Blood Pressure: 107/78 mmHg. Respiratory normal breathing without difficulty. clear to auscultation bilaterally. Cardiovascular Burr, Trinidad ([id number]) regular rate and rhythm with normal S1, S2. Psychiatric this patient is able to make decisions and demonstrates good insight into disease process. Alert and Oriented x 3. pleasant and cooperative.  General Notes: Patient's wound bed currently shows a significant amount of necrotic tissue on the surface of the wound including some callous which is actually lifting off. She also has your theme of the great toe really I'm not  seeing much spreading up her foot although this apparently was the case just a couple of days ago. With that being said I'm definitely concerned about infection at this point. Post debridement the wound bed appears to be better although it is significantly larger compared to last week. Integumentary (Hair, Skin) Wound #5 status is Open. Original cause of wound was Gradually Appeared. The wound is located on the Left Toe Great. The wound measures 1cm length x 0.6cm width x 0.5cm depth; 0.471cm^2 area and 0.236cm^3 volume. There is Fat Layer (Subcutaneous Tissue) Exposed exposed. There is no tunneling noted, however, there is undermining starting at 6:00 and ending at 1:00 with a maximum distance of 0.5cm. There is a small amount of serous drainage noted. The wound margin is epibole. There is large (67-100%) red granulation within the wound bed. There is a small (1-33%) amount of necrotic tissue within the wound bed including Adherent Slough. The periwound skin appearance exhibited: Callus, Erythema. The periwound skin appearance did not exhibit: Crepitus, Excoriation, Induration, Rash, Scarring, Dry/Scaly, Maceration, Atrophie Blanche, Cyanosis, Ecchymosis, Hemosiderin Staining, Mottled, Pallor, Rubor. The surrounding wound skin color is noted with erythema which is circumferential. Periwound temperature was noted as No Abnormality. 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 There was a Excisional Skin/Subcutaneous Tissue Debridement with a total area of 0.6 sq cm performed by STONE III, Neizan Debruhl E., PA-C. With the following instrument(s): Curette to remove Viable and Non-Viable  tissue/material. Material removed includes Callus, Subcutaneous Tissue, Slough, and Biofilm after achieving pain control using Lidocaine. No specimens were taken. A time out was conducted at 15:10, prior to the start of the procedure. A Minimum amount of bleeding was controlled with Pressure. The procedure was tolerated well with a pain level of 0 throughout and a pain level of 0 following the procedure. Post Debridement Measurements: 1cm length x 0.6cm width x 0.5cm depth; 0.236cm^3 volume. Character of Wound/Ulcer Post Debridement is improved. Severity of Tissue Post Debridement is: Fat layer exposed. Post procedure Diagnosis Wound #5: Same as Pre-Procedure Cuppett, Jalana A. ([id number]) Plan Wound Cleansing: Wound #5 Left Toe Great: Clean wound with Normal Saline. Anesthetic (add to Medication List): Wound #5 Left Toe Great: Topical Lidocaine 4% cream applied to wound bed prior to debridement (In Clinic Only). Primary Wound Dressing: Wound #5 Left Toe Great: Silver Alginate - Gentamicin on wound bed Secondary Dressing: Wound #5 Left Toe Great: Conform/Kerlix - foam Dressing Change Frequency: Wound #5 Left Toe Great: Three times weekly Follow-up Appointments: Wound #5 Left Toe Great: Return Appointment in 1 week. Medications-please add to medication list.: Wound #5 Left Toe Great: P.O. Antibiotics - Finish all antibiotics Topical Antibiotic Laboratory ordered were: Wound culture routine - Left Great Toe The following medication(s) was prescribed: gentamicin topical 0.1 % cream cream topical applied in a thin film to the wound bed then cover with dressing as directed. starting 12/07/2018 Bactrim DS oral 800 mg-160 mg tablet 1 1 tablet oral taken 2 times  a day for 14 days starting 12/07/2018 I'm in a recommend currently that we go ahead and initiate treatment with Bactrim also will give her a prescription for gentamicin cream to be applied in a thin film topically to the wound bed.  Subsequently I'm in a see were things stand at follow-up. If we get the infection control I would like to cast her ASAP in order to get this to heal and then subsequently get her into her brace which I think may help protect her from having this recur. She's in agreement with this plan. Please see above for specific wound care orders. We will see patient for re-evaluation in 1 week(s) here in the clinic. If anything worsens or changes patient will contact our office for additional recommendations. Electronic Signature(s) Signed: 12/09/2018 7:15:48 AM By: Worthy Keeler PA-C Entered By: Worthy Keeler on 12/07/2018 15:26:13 [name] ([id number]) -------------------------------------------------------------------------------- ROS/PFSH Details Patient Name: [name] A. Date of Service: 12/07/2018 2:15 PM Medical Record Number: [id number] Patient Account Number: [id number] Date of Birth/Sex: May 15, 1976 (43 y.o. F) Treating RN: Harold Barban Primary Care Provider: Priscille Kluver Other Clinician: Referring Provider: Priscille Kluver Treating Provider/Extender: STONE III, Lawrie Tunks Weeks in Treatment: 17 Information Obtained From Patient Wound History Do you currently have one or more open woundso Yes How many open wounds do you currently haveo 1 Constitutional Symptoms (General Health) Complaints and Symptoms: Negative for: Fever; Chills Eyes Medical History: Positive for: Cataracts; Glaucoma; Optic Neuritis Ear/Nose/Mouth/Throat Medical History: Positive for: Chronic sinus problems/congestion; Middle ear problems Respiratory Complaints and Symptoms: No Complaints or Symptoms Cardiovascular Complaints and Symptoms: No Complaints or Symptoms Endocrine Medical History: Positive for: Type II Diabetes Time with diabetes: 10 years Treated with: Insulin, Oral agents Blood sugar tested every day: No Blood sugar testing results: Lunch: 350 Psychiatric Complaints and  Symptoms: No Complaints or Symptoms HBO Extended History Items Wasilewski, Uchenna A. ([id number]) Eyes: Eyes: Ear/Nose/Mouth/Throat: Ear/Nose/Mouth/Throat: Cataracts Glaucoma Chronic sinus Middle ear problems problems/congestion Immunizations Pneumococcal Vaccine: Received Pneumococcal Vaccination: No Implantable Devices Family and Social History Current every day smoker Physician Affirmation I have reviewed and agree with the above information. Electronic Signature(s) Signed: 12/09/2018 7:15:48 AM By: Worthy Keeler PA-C Signed: 12/10/2018 9:36:27 AM By: Harold Barban Entered By: Worthy Keeler on 12/07/2018 15:23:08 [name] ([id number]) -------------------------------------------------------------------------------- SuperBill Details Patient Name: [name] A. Date of Service: 12/07/2018 Medical Record Number: [id number] Patient Account Number: [id number] Date of Birth/Sex: 06-Nov-1975 (43 y.o. F) Treating RN: Harold Barban Primary Care Provider: Priscille Kluver Other Clinician: Referring Provider: Priscille Kluver Treating Provider/Extender: Melburn Hake, Milina Pagett Weeks in Treatment: 17 Diagnosis Coding ICD-10 Codes Code Description E11.621 Type 2 diabetes mellitus with foot ulcer L97.522 Non-pressure chronic ulcer of other part of left foot with fat layer exposed M21.372 Foot drop, left foot Facility Procedures CPT4 Code: 76811572 Description: 11042 - DEB SUBQ TISSUE 20 SQ CM/< ICD-10 Diagnosis Description L97.522 Non-pressure chronic ulcer of other part of left foot with fat Modifier: layer exposed Quantity: 1 Physician Procedures CPT4 Code: 6203559 Description: 74163 - WC PHYS LEVEL 4 - EST PT ICD-10 Diagnosis Description E11.621 Type 2 diabetes mellitus with foot ulcer L97.522 Non-pressure chronic ulcer of other part of left foot with fat M21.372 Foot drop, left foot Modifier: 25 layer exposed Quantity: 1 CPT4 Code: [id number] Description: 11042 - WC PHYS SUBQ TISS 20  SQ CM ICD-10 Diagnosis Description L97.522 Non-pressure chronic ulcer of other part of left foot with fat Modifier: layer exposed Quantity: 1 Electronic Signature(s)  Signed: 12/09/2018 7:15:48 AM By: Worthy Keeler PA-C Entered By: Worthy Keeler on 12/07/2018 15:26:37

## 2018-12-14 ENCOUNTER — Encounter: Payer: Medicaid Other | Admitting: Physician Assistant

## 2018-12-14 DIAGNOSIS — E11621 Type 2 diabetes mellitus with foot ulcer: Secondary | ICD-10-CM | POA: Diagnosis not present

## 2018-12-18 NOTE — Progress Notes (Signed)
KATHLEE, BARNHARDT ([id number]) Visit Report for 12/14/2018 Arrival Information Details Patient Name: [name], [name] Date of Service: 12/14/2018 11:00 AM Medical Record Number: [id number] Patient Account Number: [id number] Date of Birth/Sex: 06-19-1976 (43 y.o. F) Treating RN: Montey Hora Primary Care Lindsee Labarre: Priscille Kluver Other Clinician: Referring Makiyla Linch: Priscille Kluver Treating Andriea Hasegawa/Extender: Melburn Hake, HOYT Weeks in Treatment: 18 Visit Information History Since Last Visit Added or deleted any medications: No Patient Arrived: Wheel Chair Any new allergies or adverse reactions: No Arrival Time: 11:01 Had a fall or experienced change in No activities of daily living that may affect Accompanied By: mother risk of falls: Transfer Assistance: None Signs or symptoms of abuse/neglect since last visito No Patient Identification Verified: Yes Hospitalized since last visit: No Secondary Verification Process Completed: Yes Implantable device outside of the clinic excluding No Patient Requires Transmission-Based No cellular tissue based products placed in the center Precautions: since last visit: Patient Has Alerts: No Has Dressing in Place as Prescribed: Yes Pain Present Now: No Electronic Signature(s) Signed: 12/14/2018 3:46:48 PM By: Lorine Bears RCP, RRT, CHT Entered By: Lorine Bears on 12/14/2018 11:03:00 Kapuscinski, Chayla A. ([id number]) -------------------------------------------------------------------------------- Clinic Level of Care Assessment Details Patient Name: [name], [name] Date of Service: 12/14/2018 11:00 AM Medical Record Number: [id number] Patient Account Number: [id number] Date of Birth/Sex: 1976-07-29 (43 y.o. F) Treating RN: Harold Barban Primary Care Ishaaq Penna: Priscille Kluver Other Clinician: Referring Adelle Zachar: Priscille Kluver Treating Kobi Mario/Extender: Melburn Hake, HOYT Weeks in Treatment: 18 Clinic Level of Care Assessment  Items TOOL 4 Quantity Score []  - Use when only an EandM is performed on FOLLOW-UP visit 0 ASSESSMENTS - Nursing Assessment / Reassessment X - Reassessment of Co-morbidities (includes updates in patient status) 1 10 X- 1 5 Reassessment of Adherence to Treatment Plan ASSESSMENTS - Wound and Skin Assessment / Reassessment X - Simple Wound Assessment / Reassessment - one wound 1 5 []  - 0 Complex Wound Assessment / Reassessment - multiple wounds []  - 0 Dermatologic / Skin Assessment (not related to wound area) ASSESSMENTS - Focused Assessment []  - Circumferential Edema Measurements - multi extremities 0 []  - 0 Nutritional Assessment / Counseling / Intervention []  - 0 Lower Extremity Assessment (monofilament, tuning fork, pulses) []  - 0 Peripheral Arterial Disease Assessment (using hand held doppler) ASSESSMENTS - Ostomy and/or Continence Assessment and Care []  - Incontinence Assessment and Management 0 []  - 0 Ostomy Care Assessment and Management (repouching, etc.) PROCESS - Coordination of Care X - Simple Patient / Family Education for ongoing care 1 15 []  - 0 Complex (extensive) Patient / Family Education for ongoing care []  - 0 Staff obtains Programmer, systems, Records, Test Results / Process Orders []  - 0 Staff telephones HHA, Nursing Homes / Clarify orders / etc []  - 0 Routine Transfer to another Facility (non-emergent condition) []  - 0 Routine Hospital Admission (non-emergent condition) []  - 0 New Admissions / Biomedical engineer / Ordering NPWT, Apligraf, etc. []  - 0 Emergency Hospital Admission (emergent condition) X- 1 10 Simple Discharge Coordination Mizrahi, Keana A. ([id number]) []  - 0 Complex (extensive) Discharge Coordination PROCESS - Special Needs []  - Pediatric / Minor Patient Management 0 []  - 0 Isolation Patient Management []  - 0 Hearing / Language / Visual special needs []  - 0 Assessment of Community assistance (transportation, D/C planning, etc.) []  -  0 Additional assistance / Altered mentation []  - 0 Support Surface(s) Assessment (bed, cushion, seat, etc.) INTERVENTIONS - Wound Cleansing / Measurement X - Simple Wound Cleansing - one wound 1  5 []  - 0 Complex Wound Cleansing - multiple wounds []  - 0 Wound Imaging (photographs - any number of wounds) []  - 0 Wound Tracing (instead of photographs) X- 1 5 Simple Wound Measurement - one wound []  - 0 Complex Wound Measurement - multiple wounds INTERVENTIONS - Wound Dressings X - Small Wound Dressing one or multiple wounds 1 10 []  - 0 Medium Wound Dressing one or multiple wounds []  - 0 Large Wound Dressing one or multiple wounds []  - 0 Application of Medications - topical []  - 0 Application of Medications - injection INTERVENTIONS - Miscellaneous []  - External ear exam 0 []  - 0 Specimen Collection (cultures, biopsies, blood, body fluids, etc.) []  - 0 Specimen(s) / Culture(s) sent or taken to Lab for analysis []  - 0 Patient Transfer (multiple staff / Civil Service fast streamer / Similar devices) []  - 0 Simple Staple / Suture removal (25 or less) []  - 0 Complex Staple / Suture removal (26 or more) []  - 0 Hypo / Hyperglycemic Management (close monitor of Blood Glucose) []  - 0 Ankle / Brachial Index (ABI) - do not check if billed separately X- 1 5 Vital Signs Sandridge, Naileah A. ([id number]) Has the patient been seen at the hospital within the last three years: Yes Total Score: 70 Level Of Care: New/Established - Level 2 Electronic Signature(s) Signed: 12/18/2018 11:13:25 AM By: Harold Barban Entered By: Harold Barban on 12/14/2018 11:42:20 Cragun, Bryanne AMarland Kitchen ([id number]) -------------------------------------------------------------------------------- Lower Extremity Assessment Details Patient Name: Heather Dear A. Date of Service: 12/14/2018 11:00 AM Medical Record Number: [id number] Patient Account Number: [id number] Date of Birth/Sex: Oct 09, 1976 (43 y.o. F) Treating RN: Montey Hora Primary Care Mysha Peeler: Priscille Kluver Other Clinician: Referring Anddy Wingert: Priscille Kluver Treating Cecil Vandyke/Extender: STONE III, HOYT Weeks in Treatment: 18 Vascular Assessment Pulses: Dorsalis Pedis Palpable: [Left:Yes] Posterior Tibial Extremity colors, hair growth, and conditions: Extremity Color: [Left:Normal] Hair Growth on Extremity: [Left:Yes] Temperature of Extremity: [Left:Warm] Capillary Refill: [Left:< 3 seconds] Toe Nail Assessment Left: Right: Thick: Yes Discolored: Yes Deformed: No Improper Length and Hygiene: No Electronic Signature(s) Signed: 12/14/2018 4:37:43 PM By: Montey Hora Entered By: Montey Hora on 12/14/2018 11:15:30 Wyche, Alexiz A. ([id number]) -------------------------------------------------------------------------------- Multi Wound Chart Details Patient Name: Heather Dear A. Date of Service: 12/14/2018 11:00 AM Medical Record Number: [id number] Patient Account Number: [id number] Date of Birth/Sex: 1976-07-20 (43 y.o. 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] Comorbid History: [5:Cataracts, Glaucoma, Optic Neuritis, Chronic sinus problems/congestion, Middle ear problems, Type II Diabetes] [N/A:N/A] Date Acquired: [5:11/16/2018] [N/A:N/A] Weeks of Treatment: [5:3] [N/A:N/A] Wound Status: [5:Open] [N/A:N/A] Measurements L x W x D [5:1.8x0.9x0.3] [N/A:N/A] (cm) Area (cm) : [5:1.272] [N/A:N/A] Volume (cm) : [5:0.382]  [N/A:N/A] % Reduction in Area: [5:-223.70%] [N/A:N/A] % Reduction in Volume: [5:-143.30%] [N/A:N/A] Classification: [5:Grade 3] [N/A:N/A] Exudate Amount: [5:Medium] [N/A:N/A] Exudate Type: [5:Serous] [N/A:N/A] Exudate Color: [5:amber] [N/A:N/A] Wound Margin: [5:Epibole] [N/A:N/A] Granulation Amount: [5:Large (67-100%)] [N/A:N/A] Granulation Quality: [5:Red] [N/A:N/A] Necrotic Amount: [5:Small (1-33%)] [N/A:N/A] Exposed Structures: [5:Fat Layer (Subcutaneous Tissue) Exposed: Yes Fascia: No Tendon: No Muscle: No Joint: No Bone: No] [N/A:N/A] Epithelialization: [5:None] [N/A:N/A] Periwound Skin Texture: [5:Callus: Yes Excoriation: No Induration: No] [N/A:N/A] Crepitus: No Rash:  No Scarring: No Periwound Skin Moisture: Maceration: No N/A N/A Dry/Scaly: No Periwound Skin Color: Erythema: Yes N/A N/A Atrophie Blanche: No Cyanosis: No Ecchymosis: No Hemosiderin Staining: No Mottled: No Pallor: No Rubor: No Erythema Location: Circumferential N/A N/A Temperature: No Abnormality N/A N/A Tenderness on Palpation: No N/A N/A Wound Preparation: Ulcer Cleansing: N/A N/A Rinsed/Irrigated with Saline Topical Anesthetic Applied: Other: lidocaine 4% Treatment Notes Electronic Signature(s) Signed: 12/18/2018 11:13:25 AM By: Harold Barban Entered By: Harold Barban on 12/14/2018 11:39:31 [name] ([id number]) -------------------------------------------------------------------------------- [place] Details Patient Name: Heather Dear A. Date of Service: 12/14/2018 11:00 AM Medical Record Number: [id number] Patient Account Number: [id number] Date of Birth/Sex: 1976/01/18 (44 y.o. F) Treating RN: Harold Barban Primary Care Yannis Broce: Priscille Kluver Other Clinician: Referring Francoise Chojnowski: Priscille Kluver Treating Odessa Morren/Extender: Melburn Hake, HOYT Weeks in Treatment: 18 Active Inactive Abuse / Safety / Falls / Self Care Management Nursing Diagnoses: History of  Falls Goals: Patient will not experience any injury related to falls Date Initiated: 08/07/2018 Target Resolution Date: 10/19/2018 Goal Status: Active Interventions: Assess fall risk on admission and as needed Notes: Orientation to the Wound Care Program Nursing Diagnoses: Knowledge deficit related to the wound healing center program Goals: Patient/caregiver will verbalize understanding of the [place] Program Date Initiated: 08/07/2018 Target Resolution Date: 10/19/2018 Goal Status: Active Interventions: Provide education on orientation to the wound center Notes: Soft Tissue Infection Nursing Diagnoses: Impaired tissue integrity Goals: Patient will remain free of wound infection Date Initiated: 08/07/2018 Target Resolution Date: 10/19/2018 Goal Status: Active Interventions: Assess signs and symptoms of infection every visit Traynham, Corda A. ([id number]) Notes: Wound/Skin Impairment Nursing Diagnoses: Impaired tissue integrity Goals: Ulcer/skin breakdown will heal within 14 weeks Date Initiated: 08/07/2018 Target Resolution Date: 10/19/2018 Goal Status: Active Interventions: Assess patient/caregiver ability to obtain necessary supplies Notes: Electronic Signature(s) Signed: 12/18/2018 11:13:25 AM By: Harold Barban Entered By: Harold Barban on 12/14/2018 11:39:06 Achterberg, Trezure A. ([id number]) -------------------------------------------------------------------------------- Pain Assessment Details Patient Name: Heather Dear A. Date of Service: 12/14/2018 11:00 AM Medical Record Number: [id number] Patient Account Number: [id number] Date of Birth/Sex: Sep 06, 1976 (43 y.o. F) Treating RN: Montey Hora Primary Care Vinetta Brach: Priscille Kluver Other Clinician: Referring Eirik Schueler: Priscille Kluver Treating Mabel Unrein/Extender: STONE III, HOYT Weeks in Treatment: 18 Active Problems Location of Pain Severity and Description of Pain Patient Has Paino No Site  Locations Pain Management and Medication Current Pain Management: Electronic Signature(s) Signed: 12/14/2018 3:46:48 PM By: Paulla Fore, RRT, CHT Signed: 12/14/2018 4:37:43 PM By: Montey Hora Entered By: Lorine Bears on 12/14/2018 11:03:10 [name] ([id number]) -------------------------------------------------------------------------------- Patient/Caregiver Education Details Patient Name: Heather Dear A. Date of Service: 12/14/2018 11:00 AM Medical Record Number: [id number] Patient Account Number: [id number] Date of Birth/Gender: 1976/05/14 (43 y.o. F) Treating RN: Harold Barban Primary Care Physician: Priscille Kluver Other Clinician: Referring Physician: Priscille Kluver Treating Physician/Extender: Sharalyn Ink in Treatment: 23 Education Assessment Education Provided To: Patient Education Topics Provided Pressure: Handouts: Pressure Ulcers: Care and Offloading Methods: Demonstration, Explain/Verbal Responses: State content correctly Wound/Skin Impairment: Handouts: Caring for Your Ulcer Methods: Demonstration, Explain/Verbal Responses: State content correctly Electronic Signature(s) Signed: 12/18/2018 11:13:25 AM By: Harold Barban Entered By: Harold Barban on 12/14/2018 11:39:59 Vandruff, Lylla A. ([id number]) -------------------------------------------------------------------------------- Wound Assessment Details Patient Name: Heather Dear A. Date of Service: 12/14/2018 11:00 AM Medical Record Number: [id number] Patient Account Number: [id number] Date of Birth/Sex: 1976/03/27 (43 y.o. F) Treating RN: Montey Hora Primary Care Keyonte Cookston: Priscille Kluver Other Clinician: Referring Joud Pettinato: Priscille Kluver Treating Jarriel Papillion/Extender:  STONE III, HOYT Weeks in Treatment: 18 Wound Status Wound Number: 5 Primary Diabetic Wound/Ulcer of the Lower Extremity Etiology: Wound Location: Left Toe Great Wound Open Wounding Event:  Gradually Appeared Status: Date Acquired: 11/16/2018 Comorbid Cataracts, Glaucoma, Optic Neuritis, Chronic Weeks Of Treatment: 3 History: sinus problems/congestion, Middle ear problems, Clustered Wound: No Type II Diabetes Photos Photo Uploaded By: Montey Hora on 12/14/2018 16:34:42 Wound Measurements Length: (cm) 1.8 Width: (cm) 0.9 Depth: (cm) 0.3 Area: (cm) 1.272 Volume: (cm) 0.382 % Reduction in Area: -223.7% % Reduction in Volume: -143.3% Epithelialization: None Tunneling: No Undermining: No Wound Description Classification: Grade 3 Wound Margin: Epibole Exudate Amount: Medium Exudate Type: Serous Exudate Color: amber Foul Odor After Cleansing: No Slough/Fibrino No Wound Bed Granulation Amount: Large (67-100%) Exposed Structure Granulation Quality: Red Fascia Exposed: No Necrotic Amount: Small (1-33%) Fat Layer (Subcutaneous Tissue) Exposed: Yes Necrotic Quality: Adherent Slough Tendon Exposed: No Muscle Exposed: No Joint Exposed: No Bone Exposed: No Periwound Skin Texture Huntsman, Khamiya A. ([id number]) Texture Color No Abnormalities Noted: No No Abnormalities Noted: No Callus: Yes Atrophie Blanche: No Crepitus: No Cyanosis: No Excoriation: No Ecchymosis: No Induration: No Erythema: Yes Rash: No Erythema Location: Circumferential Scarring: No Hemosiderin Staining: No Mottled: No Moisture Pallor: No No Abnormalities Noted: No Rubor: No Dry / Scaly: No Maceration: No Temperature / Pain Temperature: No Abnormality Wound Preparation Ulcer Cleansing: Rinsed/Irrigated with Saline Topical Anesthetic Applied: Other: lidocaine 4%, Electronic Signature(s) Signed: 12/14/2018 4:37:43 PM By: Montey Hora Entered By: Montey Hora on 12/14/2018 11:14:24 [name] ([id number]) -------------------------------------------------------------------------------- Vitals Details Patient Name: Heather Dear A. Date of Service: 12/14/2018 11:00  AM Medical Record Number: [id number] Patient Account Number: [id number] Date of Birth/Sex: June 10, 1976 (43 y.o. F) Treating RN: Montey Hora Primary Care Shandon Burlingame: Priscille Kluver Other Clinician: Referring Tanara Turvey: Priscille Kluver Treating Siddarth Hsiung/Extender: STONE III, HOYT Weeks in Treatment: 18 Vital Signs Time Taken: 11:03 Temperature (F): 98.5 Height (in): 69 Pulse (bpm): 75 Weight (lbs): 240 Respiratory Rate (breaths/min): 16 Body Mass Index (BMI): 35.4 Blood Pressure (mmHg): 95/66 Reference Range: 80 - 120 mg / dl Electronic Signature(s) Signed: 12/14/2018 3:46:48 PM By: Lorine Bears RCP, RRT, CHT Entered By: Lorine Bears on 12/14/2018 11:05:42

## 2018-12-18 NOTE — Progress Notes (Signed)
ARIA, JARRARD ([id number]) Visit Report for 12/14/2018 Chief Complaint Document Details Patient Name: Heather Heather, Heather Heather. Date of Service: 12/14/2018 11:00 AM Medical Record Number: [id number] Patient Account Number: [id number] Date of Birth/Sex: 1976/05/25 (43 y.o. F) Treating RN: Heather Heather Primary Care Provider: Priscille Heather Other Clinician: Referring Provider: Priscille Heather Treating Provider/Extender: Heather Heather Weeks in Treatment: 18 Information Obtained from: Patient Chief Complaint Left great toe ulcer Electronic Signature(s) Signed: 12/14/2018 4:27:09 PM By: Heather Keeler PA-C Entered By: Heather Heather on 12/14/2018 10:51:40 Heather Heather ([id number]) -------------------------------------------------------------------------------- HPI Details Patient Name: Heather Heather. Date of Service: 12/14/2018 11:00 AM Medical Record Number: [id number] Patient Account Number: [id number] Date of Birth/Sex: 18-Feb-1976 (43 y.o. F) Treating RN: Heather Heather Primary Care Provider: Priscille Heather Other Clinician: Referring Provider: Priscille Heather Treating Provider/Extender: Heather Heather Weeks in Treatment: 18 History of Present Illness HPI Description: 01/18/18-She is here for initial evaluation of the left great toe ulcer. She is Heather Heather historian in regards to timeframe in detail. She states approximately 4 weeks ago she lacerated her toe on something in the house. She followed up with her primary care who placed her on Bactrim and ultimately Heather Heather dose of Bactrim prior to coming to wound clinic. She states she has been treating the toe with peroxide, Betadine and Heather Heather. She did not check her blood sugar this morning but checked it yesterday morning it was 327; she is unaware of Heather recent A1c and there are no current records. She saw Dr. she would've orthopedics last week for an old injury to the left ankle, she states he did not see her toe, nor did she bring it to his  attention. She smokes approximately 1 pack cigarettes Heather Heather. Her social situation is concerning, she arrives this morning with her mother who appears extremely intoxicated/under the influence; her mother was asked to leave the room and be monitored by the patient's grandmother. The patient's aunt then accompanied the patient and the room throughout the rest of the appointment. We had Heather lengthy discussion regarding the deleterious effects of uncontrolled hyperglycemia and smoking as it relates to wound healing and overall health. She was strongly encouraged to decrease her smoking and get her diabetes under better control. She states she is currently on Heather diet and has cut down her [facility] consumption. The left toe is erythematous, macerated and slightly edematous with malodor present. The edema in her left foot is below her baseline, there is no erythema streaking. We will treat her with Santyl, doxycycline; we have ordered and xray, culture and provided Heather Heather surgical shoe and cultured the wound. 01/25/18-She is here in follow-up evaluation for Heather left great toe ulcer and presents with an abscess to her suprapubic area. She states her blood sugars remain elevated, feeling "sick" and if levels are below 250, but she is trying. She has made no attempt to decrease her smoking stating that we "can't take away her food in her cigarettes". She has been compliant with offloading using the PEG [name] you. She is using Santyl daily. the culture obtained last week grew staph aureus and Enterococcus faecalis; continues on the doxycycline and Augmentin was added on Monday. The suprapubic area has erythema, no femoral variation, purple discoloration, minimal induration, was accessed with Heather cotton tip applicator with sanguinopurulent drainage, this was cultured, I suspect the current antibiotic treatment will cover and we will not add anything to her current treatment plan. She was advised  to go to urgent  care or ER with any change in redness, induration or fever. 02/01/18-She is here in follow-up evaluation for left great toe ulcers and Heather new abdominal abscess from last week. She was able to use packing until earlier this week, where she "forgot it was there". She states she was feeling ill with GI symptoms last week and was not taking her antibiotic. She states her glucose levels have been predominantly less than 200, with occasional levels between 200-250. She thinks this was contributing to her GI symptoms as they have resolved without intervention. There continues to be significant laceration to left toe, otherwise it clinically looks stable/improved. There is now less superficial opening to the lateral aspect of the great toe that was residual blister. We will transition to Heather [facility] [name] to all wounds, she will continue her Augmentin. If there is no change or deterioration next week for reculture. 02/08/18-She is here in follow-up evaluation for left great toe ulcer and abdominal ulcer. There is an improvement in both wounds. She has been wrapping her left toe with coban, not by our direction, which has created an area of discoloration to the medial aspect; she has been advised to NOT use coban secondary to her neuropathy. She states her glucose levels have been high over this last week ranging from 200-350, she continues to smoke. She admits to being less compliant with her offloading shoe. We will continue with same treatment plan and she will follow-up next week. 02/15/18-She is here in follow-up evaluation for left great toe ulcer and abdominal ulcer. The abdominal ulcer is epithelialized. The left great toe ulcer is improved and all injury from last week using the Coban wrap is resolved, the lateral ulcer is healed. She admits to noncompliance with wearing offloading shoe and admits to glucose levels being greater than 300 most of the week. She continues to smoke and expresses no desire to  quit. There is one area medially that probes deeper than it has historically, erythema to the toe and dorsal foot has consistently waxed and waned. There is no overt signs of cellulitis or infection but we will culture the wound for any occult infection given the new area of depth and erythema. We will hold off on sensitivities for initiation of antibiotic therapy. 02/22/18-She is here in follow up evaluation for left great toe ulcer. There is overall significant improvement in both wound appearance, erythema and edema with changes made last week. She was not initiated on antibiotic therapy. Culture obtained last week showed oxacillin sensitive staph aureus, sensitive to clindamycin. Clindamycin has been called into the pharmacy but she has been instructed to hold off on initiation secondary to overall clinical improvement and her history of antibiotic intolerance. She has been instructed to contact the clinic with any noted changes/deterioration and the wound, erythema, Haman, Kloe Heather. ([id number]) edema and/or pain. She will follow-up next week. She continues to smoke and her glucose levels remain elevated >250; she admits to compliance with offloading shoe 03/01/18 on evaluation today patient appears to be doing fairly well in regard to her left first toe ulcer. She has been tolerating the dressing changes with the [facility] Dressing without complication and overall this has definitely showed signs of improvement according to records as well is what the patient tells me today. I'm very pleased in that regard. She is having no pain today 03/08/18 She is here for follow up evaluation of Heather left great toe ulcer. She remains non-compliant with glucose control  and smoking cessation; glucose levels consistently >200. She states that she got new shoe inserts/peg [name]. She admits to compliance with offloading. Since my last evaluation there is significant improvement. We will switch to prisma at this  time and she will follow up next week. She is noted to be tachycardic at this appointment, heart rate 120s; she has Heather history of heart rate 70-130 according to our records. She admits to extreme agitation r/t personal issues; she was advised to monitor her heartrate and contact her physician if it does not return to Heather more normal range (<100). She takes cardizem twice daily. 03/15/18-She is here in follow-up evaluation for left great toe ulcer. She remains noncompliant with glucose control and smoking cessation. She admits to compliance with wearing offloading shoe. The ulcer is improved/stable and we will continue with the same treatment plan and she will follow-up next week 03/22/18-She is here for evaluation for left great toe ulcer. There continues to be significant improvement despite recurrent hyperglycemia (over 500 yesterday) and she continues to smoke. She has been compliant with offloading and we will continue with same treatment plan and she will follow-up next week. 03/29/18-She is here for evaluation for left great toe ulcer. Despite continuing to smoke and uncontrolled diabetes she continues to improve. She is compliant with offloading shoe. We will continue with the same treatment plan and she will follow-up next week 04/05/18- She is here in follow up evaluation for Heather left great toe ulcer; she presents with small pustule to left fifth toe (resembles ant bite). She admits to compliance with wearing offloading shoe; continues to smoke or have uncontrolled blood glucose control. There is more callus than usual with evidence of bleeding; she denies known trauma. 04/12/18-She is here for evaluation of left great toe ulcer. Despite noncompliance with glycemic control and smoking she continues to make improvement. She continues to wear offloading shoe. The pustule, that was identified last week, to the left fifth toe is resolved. She will follow-up in 2 weeks 05/03/18-she is seen in follow-up  evaluation for Heather left great toe ulcer. She is compliant with offloading, otherwise noncompliant with glycemic control and smoking. She has plateaued and there is minimal improvement noted. We will transition to [facility], replaced the insert to her surgical shoe and she will follow-up in one week 05/10/18- She is here in follow up evaluation for Heather left great toe ulcer. It appears stable despite measurement change. We will continue with same treatment plan and follow up next week. 05/24/18-She is seen in follow-up evaluation for Heather left great toe ulcer. She remains compliant with offloading, has made significant improvement in her diet, decreasing the amount of sugar/soda. She said her recent A1c was 10.9 which is lower than. She did see Heather diabetic nutritionist/educator yesterday. She continues to smoke. We will continue with the same treatment plan and she'll follow-up next week. 05/31/18- She is seen in follow-up evaluation for left great toe ulcer. She continues to remain compliant with offloading, continues to make improvement in her diet, increasing her water and decreasing the amount of sugar/soda. She does continue to smoke with no desire to quit. We will apply Prisma to the depth and Hydrofera Blue over. We have not received insurance authorization for oasis. She will follow up next week. 06/07/18-She is seen in follow-up evaluation for left great toe ulcer. It has stalled according to today's measurements although base appears stable. She says she saw Heather diabetic educator yesterday; her average blood sugars are  less than 300 which is an improvement for her. She continues to smoke and states "that's my next step" She continues with water over soda. We will order for xray, culture and reinstate ace wrap compression prior to placing apligraf for next week. She is voicing no complaints or concerns. Her dressing will change to iodoflex over the next week in preparation for apligraf. 06/14/18-She is  seen in follow-up evaluation for left great toe ulcer. Plain film x-ray performed last week was negative for osteomyelitis. Wound culture obtained last week grew strep B and OSSA; she is initiated on keflex and cefdinir today; there is erythema to the toe which could be from ace wrap compression, she has Heather history of wrapping too tight and has has been encouraged to maintain ace wraps that we place today. We will hold off on application of apligraf today, will apply next week after antibiotic therapy has been initiated. She admits today that she has resumed taking Heather shower with her foot/toe submerged in water, she has been reminded to keep foot/toe out of the bath water. She will be seen in follow up next week 06/21/18-she is seen in follow-up evaluation for left great toe ulcer. She is tolerating antibiotic therapy with no GI disturbance. The wound is stable. Apligraf was applied today. She has been decreasing her smoking, only had 4 cigarettes yesterday and 1 today. She continues being more compliant in diabetic diet. She will follow-up next week for evaluation of site, if stable will remove at 2 weeks. 06/28/18- She is here in follow up evalution. Apligraf was placed last week, she states the dressing fell off on Tuesday and she was dressing with hydrofera blue. She is healed and will be discharged from the clinic today. She has been instructed to continue with smoking cessation, continue monitoring glucose levels, offloading for an additional 4 weeks and continue with Philipp, Darenda Heather. ([id number]) hydrofera blue for additional two weeks for any possible microscopic opening. Readmission: 08/07/18 on evaluation today patient presents for reevaluation concerning the ulcer of her right great toe. She was previously discharged on 06/28/18 healed. Nonetheless she states that this began to show signs of drainage she subsequently went to her primary care provider. Subsequently an x-ray was performed on  08/01/18 which was negative. The patient was also placed on antibiotics at that time. Fortunately they should have been effective for the infection. Nonetheless she's been experiencing some improvement but still has Heather lot of drainage coming from the wound itself. 08/14/18 on evaluation today patient's wound actually does show signs of improvement in regard to the erythema at this point. She has completed the antibiotics. With that being said we did discuss the possibility of placing her in Heather total contact cast as of today although I think that I may want to give this just Heather little bit more time to ensure nothing recurrence as far as her infection is concerned. I do not want to put in the cast and risk infection at that time if things are not completely resolved. With that being said she is gonna require some debridement today. 08/21/18 on evaluation today patient actually appears to be doing okay in regard to her toe ulcer. She's been tolerating the dressing changes without complication. With that being said it does appear that she is ready and in fact I think it's appropriate for [place] to go ahead and initiate the total contact cast today. Nonetheless she will require some sharp debridement to prepare the wound for application. Overall  I feel like things have been progressing well but we do need to do something to get this to close more readily. 08/24/18 patient seen today for reevaluation after having had the total contact cast applied on Tuesday. She seems to have done very well the wound appears to be doing great and overall I'm pleased with the progress that she's made. There were no abnormal areas of rubbing from the cast on her lower extremity. 08/30/18 on evaluation today patient actually appears to be completely healed in regard to her plantar toe ulcer. She tells me at this point she's been having Heather lot of issues with the cast. She almost fell Heather couple of times the state shall the step of her  dog Heather couple times as well. This is been Heather very frustrating process for her other nonetheless she has completely healed the wound which is excellent news. Overall there does not appear to be the evidence of infection at this time which is great news. 09/11/18 evaluation today patient presents for follow-up concerning her great toe ulcer on the left which has unfortunately reopened since I last saw her which was only Heather couple of weeks ago. Unfortunately she was not able to get in to get the shoe and potentially the AFO that's gonna be necessary due to her left foot drop. She continues with offloading shoe but this is not enough to prevent her from reopening it appears. When we last had her in the total contact cast she did well from Heather healing standpoint but unfortunately the wound reopened as soon as she came out of the cast within just Heather couple of weeks. 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 This is also part of the reason why she falls on Heather regular basis. I just do not believe that is gonna be safe for [place] to reinitiate the total contact cast as last time we had this on she fell 3 times one week which is definitely not normal for her. 09/18/18 upon evaluation today the patient actually appears to be doing about the same in regard to her toe ulcer. She did not contact [employer] as I asked her to even though I had given her the prescription. In fact she actually states that she has no idea where the prescription is. She did apparently call [employer] and they told her that all she needed to do was bring the prescription in order to be able to be seen and work on getting the AFO for her left foot. With all that being said she still does not have an appointment and I'm not sure were things stand that regard.  I will give her Heather new prescription today in order to contact them to get this set up. 09/25/18 on evaluation today patient actually appears to be doing about the same in regard to her toes ulcer. She does have Heather small areas which seems to have Heather lot of callous buildup around the edge of the wound which is going to need sharp debridement today. She still is waiting to be scheduled for evaluation with [employer] for possibility of an AFO. She states there supposed to call her tomorrow to get this set up. Unfortunately it does appear that her foot specifically the toe area is showing signs  of erythema. There does not appear to be any systemic infection which is in these good news. 10/02/18 on evaluation today patient actually appears to be doing about the same in regard to her toe ulcer. This really has not done too well although it's not significantly larger it's also not significantly smaller. She has been tolerating the dressing changes without complication. She actually has her appointment with [employer] and [place] tomorrow to hopefully be measured for obtaining and AFO splint. I think this would be helpful preventing this from reoccurring. We had contemplated starting the cast this week although to be honest I am reluctant to do that as she's been having nausea, vomiting, and seizure activity over the past three days. She has Heather history of seizures and have been told is nothing that can be done for these. With Jaime, [place] Heather. ([id number]) that being said I do believe that along with the seizures have the nausea vomiting which upon further questioning doesn't seem to be the normal for her and makes me concerned for the possibility of infection or something else going on. I discussed this with the patient and her mother during the office visit today. I do not feel the wound is effective but maybe something else. The responses this was "this just happens to her at times and we don't know why". They did  not seem to be interested in going to the hospital to have this checked out further. 10/09/18 on evaluation today patient presents for follow-up concerning her ongoing toe ulcer. She has been tolerating the dressing changes without complication. Fortunately there does not appear to be any evidence of infection which is great news however I do think that the patient would benefit from going ahead for with the total contact cast. She's actually in Heather wheelchair today she tells me that she will use her walker if we initiate the cast. I was very specific about the fact that if we were gonna do the cast I wanted to make sure that she was using the walker in order to prevent any falls. She tells me she does not have stairs that she has to traverse on Heather regular basis at her home. She has not had any seizures since last week again that something that happens to her often she tells me she did talk to [employer] and they said that it may take up to three weeks to get the brace approved for her. Hopefully that will not take that long but nonetheless in the meantime I do think the cast could be of benefit. 10/12/18 on evaluation today patient appears to be doing rather well in regard to her toe ulcer. It's just been Heather few days and already this is significantly improved both as far as overall appearance and size. Fortunately there's no sign of infection. She is here for her first obligatory cast change. 10/19/18 Seen today for follow up and management of left great toe ulcer. Wound continues to show improvement. Noted small open area with seroussang drainage with palpation. Denies any increased pain or recent fevers during visit. She will continue calcium alginate with offloading shoe. Denies any questions or concerns during visit. 10/26/18 on evaluation today patient appears to be doing about the same as when I last saw her in regard to her wound bed. Fortunately there does not appear to be any signs of infection.  Unfortunately she continues to have Heather breakdown in regard to the toe region any time that she is not in the cast.  It takes almost no time at all for this to happen. Nonetheless she still has not heard anything from the brace being made by [employer] as to when exactly this will be available to her. Fortunately there is no signs of infection at this time. 10/30/18 on evaluation today patient presents for application of the total contact cast as we just received him this morning. Fortunately we are gonna be able to apply this to her today which is great news. She continues to have no significant pain which is good news. Overall I do feel like things have been improving while she was the cast is when she doesn't have Heather cast that things get worse. She still has not really heard anything from [place] regarding her brace. 11/02/18 upon evaluation today patient's wound already appears to be doing significantly better which is good news. Fortunately there does not appear to be any signs of infection also good news. Overall I do think the total contact cast as before is helping to heal this area unfortunately it's just not gonna likely keep the area closed and healed without her getting her brace at least. Again the foot drop is Heather significant issue for her. 11/09/18 on evaluation today patient appears to be doing excellent in regard to her toe ulcer which in fact is completely healed. Fortunately we finally got the situation squared away with the paperwork which was needed to proceed with getting her brace approved by Medicaid. I have filled that out unfortunately that information has been sent to the orthopedic office that I worked at 2 1/2 years ago and not tired Current wound care measures. Fortunately she seems to be doing very well at this time. 11/23/18 on evaluation today patient appears to be doing More Poorly Compared to Last Time I Saw Her. At [facility] She Had Completely Healed. Currently she is  continuing to have issues with reopening. She states that she just found out that the brace was approved through Medicaid now she just has to go get measured in order to have this fitted for her and then made. Subsequently she does not have an appointment for this yet that is going to complicate things we obviously cannot put her back in the cast if we do not have everything measured because they're not gonna be able to measure her foot while she is in the cast. Unfortunately the other thing that I found out today as well is that she was in the hospital over the weekend due to having Heather heroin overdose. Obviously this is unfortunate and does have me somewhat worried as well. 11/30/18 on evaluation today patient's toe ulcer actually appears to be doing fairly well. The good news is she will be getting her brace in the shoes next week on Wednesday. Hopefully we will be able to get this to heal without having to go back in the cast however she may need the cast in order to get the wound completely heal and then go from there. Fortunately there's no signs of infection at this time. 12/07/18 on evaluation today patient fortunately did receive her brace and she states she could tell this definitely makes her walk better. With that being said she's been having issues with her toe where she noticed yesterday there was Heather lot of tissue Heather Heather, Heather Heather. ([id number]) that was loosing off this appears to be much larger than what it was previous. She also states that her leg has been read putting much across the top of  her foot just about the ankle although this seems to be receiving somewhat. The total area is still red and appears to be someone infected as best I can tell. She is previously taken Bactrim and that may be Heather good option for her today as well. We are gonna see what I wound culture shows as well and I think that this is definitely appropriate. With that being said outside of the culture I still need to  initiate something in the interim and that's what I'm gonna go ahead and select Bactrim is Heather good option for her. 12/14/18 on evaluation today patient appears to be doing better in regard to her left great toe ulcer as compared to last week's evaluation. There's still some erythema although this is significantly improved which is excellent news. Overall I do believe that she is making good progress is still gonna take some time before she is where I would like her to be from the standpoint of being able to place her back into the total contact cast. Hopefully we will be where we need to be by next week. Electronic Signature(s) Signed: 12/14/2018 4:27:09 PM By: Heather Keeler PA-C Entered By: Heather Heather on 12/14/2018 12:45:48 Heather Heather ([id number]) -------------------------------------------------------------------------------- Physical Exam Details Patient Name: Heather Heather. Date of Service: 12/14/2018 11:00 AM Medical Record Number: [id number] Patient Account Number: [id number] Date of Birth/Sex: April 02, 1976 (43 y.o. F) Treating RN: Heather Heather Primary Care Provider: Priscille Heather Other Clinician: Referring Provider: Priscille Heather Treating Provider/Extender: STONE III, [name] Weeks in Treatment: 36 Constitutional Well-nourished and well-hydrated in no acute distress. Respiratory normal breathing without difficulty. clear to auscultation bilaterally. Cardiovascular regular rate and rhythm with normal S1, S2. Psychiatric this patient is able to make decisions and demonstrates good insight into disease process. Alert and Oriented x 3. pleasant and cooperative. Notes Patient's wound bed does show signs of improvement compared to last week's evaluation fortunately there is no signs of active spreading infection which is good news. Again I do believe her brace is doing an excellent job in helping her to walk without causing damage to her toe which is why the toe looks so well  despite the fact she's not been in the cast. With that being said this is still something that is going to take some time to get to the point where we can put her back into the total contact cast. Electronic Signature(s) Signed: 12/14/2018 4:27:09 PM By: Heather Keeler PA-C Entered By: Heather Heather on 12/14/2018 12:46:29 Heather Heather ([id number]) -------------------------------------------------------------------------------- Physician Orders Details Patient Name: Heather Heather. Date of Service: 12/14/2018 11:00 AM Medical Record Number: [id number] Patient Account Number: [id number] Date of Birth/Sex: [birth year] (43 y.o. 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 Anesthetic (add to Medication List) Wound #5 Left Toe Great o Topical Lidocaine 4% cream applied to wound bed prior to debridement (In Clinic Only). Primary Wound Dressing Wound #5 Left Toe Great o Silver Alginate - Gentamicin on wound bed Secondary Dressing Wound #5 Left Toe Great o Conform/Kerlix - foam Dressing Change Frequency Wound #5 Left  Toe Great o Three times weekly Follow-up Appointments Wound #5 Left Toe Great o Return Appointment in 1 week. Medications-please add to medication list. Wound #5 Left Toe Great o P.O. Antibiotics - Finish all antibiotics o Topical Antibiotic Electronic Signature(s) Signed: 12/14/2018 4:27:09 PM By: Heather Keeler PA-C Signed: 12/18/2018 11:13:25 AM By: Harold Barban Entered By: Harold Barban on  12/14/2018 11:41:43 Heather Heather ([id number]) [name], Amylee AMarland Kitchen ([id number]) -------------------------------------------------------------------------------- Problem List Details Patient Name: Heather Heather. Date of Service: 12/14/2018 11:00 AM Medical Record Number: [id number] Patient Account Number: [id number] Date of Birth/Sex: 20-Apr-1976 (43 y.o. 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 ([id number]) -------------------------------------------------------------------------------- Progress Note Details Patient Name: Heather Heather. Date of Service: 12/14/2018 11:00 AM Medical Record Number: [id number] Patient Account Number: [id number] Date of Birth/Sex: 1976-02-23 (43 y.o. F) Treating RN: Heather Heather Primary Care Provider: Priscille Heather Other Clinician: Referring Provider: Priscille Heather Treating Provider/Extender: Heather Heather Weeks in Treatment: 18 Subjective Chief Complaint Information obtained from Patient Left great toe ulcer History of Present Illness (HPI) 01/18/18-She is here for initial evaluation of the left great toe ulcer. She is Heather Heather historian in regards to timeframe in detail. She states approximately 4 weeks ago she lacerated  her toe on something in the house. She followed up with her primary care who placed her on Bactrim and ultimately Heather Heather dose of Bactrim prior to coming to wound clinic. She states she has been treating the toe with peroxide, Betadine and Heather Heather. She did not check her blood sugar this morning but checked it yesterday morning it was 327; she is unaware of Heather recent A1c and there are no current records. She saw Dr. she would've orthopedics last week for an old injury to the left ankle, she states he did not see her toe, nor did she bring it to his attention. She smokes approximately 1 pack cigarettes Heather Heather. Her social situation is concerning, she arrives this morning with her mother who appears extremely intoxicated/under the influence; her mother was asked to leave the room and be monitored by the patient's grandmother. The patient's aunt then accompanied the patient and the room throughout the rest of the appointment. We had Heather lengthy discussion regarding the deleterious effects of uncontrolled hyperglycemia and smoking as it relates to wound healing and overall health. She was strongly encouraged to decrease her smoking and get her diabetes under better control. She states she is currently on Heather diet and has cut down her [facility] consumption. The left toe is erythematous, macerated and slightly edematous with malodor present. The edema in her left foot is below her baseline, there is no erythema streaking. We will treat her with Santyl, doxycycline; we have ordered and xray, culture and provided Heather Heather surgical shoe and cultured the wound. 01/25/18-She is here in follow-up evaluation for Heather left great toe ulcer and  presents with an abscess to her suprapubic area. She states her blood sugars remain elevated, feeling "sick" and if levels are below 250, but she is trying. She has made no attempt to decrease her smoking stating that we "can't take away her food in her cigarettes". She has  been compliant with offloading using the PEG [name] you. She is using Santyl daily. the culture obtained last week grew staph aureus and Enterococcus faecalis; continues on the doxycycline and Augmentin was added on Monday. The suprapubic area has erythema, no femoral variation, purple discoloration, minimal induration, was accessed with Heather cotton tip applicator with sanguinopurulent drainage, this was cultured, I suspect the current antibiotic treatment will cover and we will not add anything to her current treatment plan. She was advised to go to urgent care or ER with any change in redness, induration or fever. 02/01/18-She is here in follow-up evaluation for left great toe ulcers and Heather new abdominal abscess from last week. She was able to use packing until earlier this week, where she "forgot it was there". She states she was feeling ill with GI symptoms last week and was not taking her antibiotic. She states her glucose levels have been predominantly less than 200, with occasional levels between 200-250. She thinks this was contributing to her GI symptoms as they have resolved without intervention. There continues to be significant laceration to left toe, otherwise it clinically looks stable/improved. There is now less superficial opening to the lateral aspect of the great toe that was residual blister. We will transition to [facility] to all wounds, she will continue her Augmentin. If there is no change or deterioration next week for reculture. 02/08/18-She is here in follow-up evaluation for left great toe ulcer and abdominal ulcer. There is an improvement in both wounds. She has been wrapping her left toe with coban, not by our direction, which has created an area of discoloration to the medial aspect; she has been advised to NOT use coban secondary to her neuropathy. She states her glucose levels have been high over this last week ranging from 200-350, she continues to smoke. She admits to  being less compliant with her offloading shoe. We will continue with same treatment plan and she will follow-up next week. 02/15/18-She is here in follow-up evaluation for left great toe ulcer and abdominal ulcer. The abdominal ulcer is epithelialized. The left great toe ulcer is improved and all injury from last week using the Coban wrap is resolved, the lateral ulcer is healed. She admits to noncompliance with wearing offloading shoe and admits to glucose levels being greater than 300 most of the week. She continues to smoke and expresses no desire to quit. There is one area medially that probes deeper than it has historically, erythema to the toe and dorsal foot has consistently waxed and waned. There is no overt signs of cellulitis or infection but we will culture the wound for any occult infection given the new area of depth and erythema. We will hold off on Pate, Dali Heather. ([id number]) sensitivities for initiation of antibiotic therapy. 02/22/18-She is here in follow up evaluation for left great toe ulcer. There is overall significant improvement in both wound appearance, erythema and edema with changes made last week. She was not initiated on antibiotic therapy. Culture obtained last week showed oxacillin sensitive staph aureus, sensitive to clindamycin. Clindamycin has been called into the pharmacy but she has been instructed to hold off on initiation secondary to overall clinical improvement and her history  of antibiotic intolerance. She has been instructed to contact the clinic with any noted changes/deterioration and the wound, erythema, edema and/or pain. She will follow-up next week. She continues to smoke and her glucose levels remain elevated >250; she admits to compliance with offloading shoe 03/01/18 on evaluation today patient appears to be doing fairly well in regard to her left first toe ulcer. She has been tolerating the dressing changes with the [facility] Dressing without  complication and overall this has definitely showed signs of improvement according to records as well is what the patient tells me today. I'm very pleased in that regard. She is having no pain today 03/08/18 She is here for follow up evaluation of Heather left great toe ulcer. She remains non-compliant with glucose control and smoking cessation; glucose levels consistently >200. She states that she got new shoe inserts/peg [name]. She admits to compliance with offloading. Since my last evaluation there is significant improvement. We will switch to prisma at this time and she will follow up next week. She is noted to be tachycardic at this appointment, heart rate 120s; she has Heather history of heart rate 70-130 according to our records. She admits to extreme agitation r/t personal issues; she was advised to monitor her heartrate and contact her physician if it does not return to Heather more normal range (<100). She takes cardizem twice daily. 03/15/18-She is here in follow-up evaluation for left great toe ulcer. She remains noncompliant with glucose control and smoking cessation. She admits to compliance with wearing offloading shoe. The ulcer is improved/stable and we will continue with the same treatment plan and she will follow-up next week 03/22/18-She is here for evaluation for left great toe ulcer. There continues to be significant improvement despite recurrent hyperglycemia (over 500 yesterday) and she continues to smoke. She has been compliant with offloading and we will continue with same treatment plan and she will follow-up next week. 03/29/18-She is here for evaluation for left great toe ulcer. Despite continuing to smoke and uncontrolled diabetes she continues to improve. She is compliant with offloading shoe. We will continue with the same treatment plan and she will follow-up next week 04/05/18- She is here in follow up evaluation for Heather left great toe ulcer; she presents with small pustule to left fifth  toe (resembles ant bite). She admits to compliance with wearing offloading shoe; continues to smoke or have uncontrolled blood glucose control. There is more callus than usual with evidence of bleeding; she denies known trauma. 04/12/18-She is here for evaluation of left great toe ulcer. Despite noncompliance with glycemic control and smoking she continues to make improvement. She continues to wear offloading shoe. The pustule, that was identified last week, to the left fifth toe is resolved. She will follow-up in 2 weeks 05/03/18-she is seen in follow-up evaluation for Heather left great toe ulcer. She is compliant with offloading, otherwise noncompliant with glycemic control and smoking. She has plateaued and there is minimal improvement noted. We will transition to [facility], replaced the insert to her surgical shoe and she will follow-up in one week 05/10/18- She is here in follow up evaluation for Heather left great toe ulcer. It appears stable despite measurement change. We will continue with same treatment plan and follow up next week. 05/24/18-She is seen in follow-up evaluation for Heather left great toe ulcer. She remains compliant with offloading, has made significant improvement in her diet, decreasing the amount of sugar/soda. She said her recent A1c was 10.9 which is  lower than. She did see Heather diabetic nutritionist/educator yesterday. She continues to smoke. We will continue with the same treatment plan and she'll follow-up next week. 05/31/18- She is seen in follow-up evaluation for left great toe ulcer. She continues to remain compliant with offloading, continues to make improvement in her diet, increasing her water and decreasing the amount of sugar/soda. She does continue to smoke with no desire to quit. We will apply Prisma to the depth and Hydrofera Blue over. We have not received insurance authorization for oasis. She will follow up next week. 06/07/18-She is seen in follow-up evaluation for left  great toe ulcer. It has stalled according to today's measurements although base appears stable. She says she saw Heather diabetic educator yesterday; her average blood sugars are less than 300 which is an improvement for her. She continues to smoke and states "that's my next step" She continues with water over soda. We will order for xray, culture and reinstate ace wrap compression prior to placing apligraf for next week. She is voicing no complaints or concerns. Her dressing will change to iodoflex over the next week in preparation for apligraf. 06/14/18-She is seen in follow-up evaluation for left great toe ulcer. Plain film x-ray performed last week was negative for osteomyelitis. Wound culture obtained last week grew strep B and OSSA; she is initiated on keflex and cefdinir today; there is erythema to the toe which could be from ace wrap compression, she has Heather history of wrapping too tight and has has been encouraged to maintain ace wraps that we place today. We will hold off on application of apligraf today, will apply next week after antibiotic therapy has been initiated. She admits today that she has resumed taking Heather shower with her foot/toe submerged in water, she has been reminded to keep foot/toe out of the bath water. She will be seen in follow up next week 06/21/18-she is seen in follow-up evaluation for left great toe ulcer. She is tolerating antibiotic therapy with no GI disturbance. The wound is stable. Apligraf was applied today. She has been decreasing her smoking, only had 4 cigarettes yesterday and Heather Heather, Heather Heather. ([id number]) 1 today. She continues being more compliant in diabetic diet. She will follow-up next week for evaluation of site, if stable will remove at 2 weeks. 06/28/18- She is here in follow up evalution. Apligraf was placed last week, she states the dressing fell off on Tuesday and she was dressing with hydrofera blue. She is healed and will be discharged from the clinic  today. She has been instructed to continue with smoking cessation, continue monitoring glucose levels, offloading for an additional 4 weeks and continue with hydrofera blue for additional two weeks for any possible microscopic opening. Readmission: 08/07/18 on evaluation today patient presents for reevaluation concerning the ulcer of her right great toe. She was previously discharged on 06/28/18 healed. Nonetheless she states that this began to show signs of drainage she subsequently went to her primary care provider. Subsequently an x-ray was performed on 08/01/18 which was negative. The patient was also placed on antibiotics at that time. Fortunately they should have been effective for the infection. Nonetheless she's been experiencing some improvement but still has Heather lot of drainage coming from the wound itself. 08/14/18 on evaluation today patient's wound actually does show signs of improvement in regard to the erythema at this point. She has completed the antibiotics. With that being said we did discuss the possibility of placing her in Heather total contact cast  as of today although I think that I may want to give this just Heather little bit more time to ensure nothing recurrence as far as her infection is concerned. I do not want to put in the cast and risk infection at that time if things are not completely resolved. With that being said she is gonna require some debridement today. 08/21/18 on evaluation today patient actually appears to be doing okay in regard to her toe ulcer. She's been tolerating the dressing changes without complication. With that being said it does appear that she is ready and in fact I think it's appropriate for [place] to go ahead and initiate the total contact cast today. Nonetheless she will require some sharp debridement to prepare the wound for application. Overall I feel like things have been progressing well but we do need to do something to get this to close more  readily. 08/24/18 patient seen today for reevaluation after having had the total contact cast applied on Tuesday. She seems to have done very well the wound appears to be doing great and overall I'm pleased with the progress that she's made. There were no abnormal areas of rubbing from the cast on her lower extremity. 08/30/18 on evaluation today patient actually appears to be completely healed in regard to her plantar toe ulcer. She tells me at this point she's been having Heather lot of issues with the cast. She almost fell Heather couple of times the state shall the step of her dog Heather couple times as well. This is been Heather very frustrating process for her other nonetheless she has completely healed the wound which is excellent news. Overall there does not appear to be the evidence of infection at this time which is great news. 09/11/18 evaluation today patient presents for follow-up concerning her great toe ulcer on the left which has unfortunately reopened since I last saw her which was only Heather couple of weeks ago. Unfortunately she was not able to get in to get the shoe and potentially the AFO that's gonna be necessary due to her left foot drop. She continues with offloading shoe but this is not enough to prevent her from reopening it appears. When we last had her in the total contact cast she did well from Heather healing standpoint but unfortunately the wound reopened as soon as she came out of the cast within just Heather couple of weeks. 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 This is also part of the reason why she falls on Heather regular basis. I just do not believe that is gonna be safe for [place] to reinitiate the total contact cast as last time we had this on she fell 3 times one week which is definitely not normal for  her. 09/18/18 upon evaluation today the patient actually appears to be doing about the same in regard to her toe ulcer. She did not contact [employer] as I asked her to even though I had given her the prescription. In fact she actually states that she has no idea where the prescription is. She did apparently call [employer] and they told her that all she needed to do was bring the prescription in order to be able to be seen and work on getting the AFO for her left  foot. With all that being said she still does not have an appointment and I'm not sure were things stand that regard. I will give her Heather new prescription today in order to contact them to get this set up. 09/25/18 on evaluation today patient actually appears to be doing about the same in regard to her toes ulcer. She does have Heather small areas which seems to have Heather lot of callous buildup around the edge of the wound which is going to need sharp debridement today. She still is waiting to be scheduled for evaluation with [facility] for possibility of an AFO. She states there supposed to call her tomorrow to get this set up. Unfortunately it does appear that her foot specifically the toe area is showing signs of erythema. There does not appear to be any systemic infection which is in these good news. Heather Heather, Heather Heather. ([id number]) 10/02/18 on evaluation today patient actually appears to be doing about the same in regard to her toe ulcer. This really has not done too well although it's not significantly larger it's also not significantly smaller. She has been tolerating the dressing changes without complication. She actually has her appointment with [facility] and [facility] tomorrow to hopefully be measured for obtaining and AFO splint. I think this would be helpful preventing this from reoccurring. We had contemplated starting the cast this week although to be honest I am reluctant to do that as she's been having nausea, vomiting, and seizure activity over the  past three days. She has Heather history of seizures and have been told is nothing that can be done for these. With that being said I do believe that along with the seizures have the nausea vomiting which upon further questioning doesn't seem to be the normal for her and makes me concerned for the possibility of infection or something else going on. I discussed this with the patient and her mother during the office visit today. I do not feel the wound is effective but maybe something else. The responses this was "this just happens to her at times and we don't know why". They did not seem to be interested in going to the hospital to have this checked out further. 10/09/18 on evaluation today patient presents for follow-up concerning her ongoing toe ulcer. She has been tolerating the dressing changes without complication. Fortunately there does not appear to be any evidence of infection which is great news however I do think that the patient would benefit from going ahead for with the total contact cast. She's actually in Heather wheelchair today she tells me that she will use her walker if we initiate the cast. I was very specific about the fact that if we were gonna do the cast I wanted to make sure that she was using the walker in order to prevent any falls. She tells me she does not have stairs that she has to traverse on Heather regular basis at her home. She has not had any seizures since last week again that something that happens to her often she tells me she did talk to [employer] and they said that it may take up to three weeks to get the brace approved for her. Hopefully that will not take that long but nonetheless in the meantime I do think the cast could be of benefit. 10/12/18 on evaluation today patient appears to be doing rather well in regard to her toe ulcer. It's just been Heather few days and already this is significantly improved both  as far as overall appearance and size. Fortunately there's no sign of  infection. She is here for her first obligatory cast change. 10/19/18 Seen today for follow up and management of left great toe ulcer. Wound continues to show improvement. Noted small open area with seroussang drainage with palpation. Denies any increased pain or recent fevers during visit. She will continue calcium alginate with offloading shoe. Denies any questions or concerns during visit. 10/26/18 on evaluation today patient appears to be doing about the same as when I last saw her in regard to her wound bed. Fortunately there does not appear to be any signs of infection. Unfortunately she continues to have Heather breakdown in regard to the toe region any time that she is not in the cast. It takes almost no time at all for this to happen. Nonetheless she still has not heard anything from the brace being made by [facility] as to when exactly this will be available to her. Fortunately there is no signs of infection at this time. 10/30/18 on evaluation today patient presents for application of the total contact cast as we just received him this morning. Fortunately we are gonna be able to apply this to her today which is great news. She continues to have no significant pain which is good news. Overall I do feel like things have been improving while she was the cast is when she doesn't have Heather cast that things get worse. She still has not really heard anything from [facility] [facility] regarding her brace. 11/02/18 upon evaluation today patient's wound already appears to be doing significantly better which is good news. Fortunately there does not appear to be any signs of infection also good news. Overall I do think the total contact cast as before is helping to heal this area unfortunately it's just not gonna likely keep the area closed and healed without her getting her brace at least. Again the foot drop is Heather significant issue for her. 11/09/18 on evaluation today patient appears to be doing excellent in regard to her toe  ulcer which in fact is completely healed. Fortunately we finally got the situation squared away with the paperwork which was needed to proceed with getting her brace approved by Medicaid. I have filled that out unfortunately that information has been sent to the orthopedic office that I worked at 2 1/2 years ago and not tired Current wound care measures. Fortunately she seems to be doing very well at this time. 11/23/18 on evaluation today patient appears to be doing More Poorly Compared to Last Time I Saw Her. At [facility] She Had Completely Healed. Currently she is continuing to have issues with reopening. She states that she just found out that the brace was approved through Medicaid now she just has to go get measured in order to have this fitted for her and then made. Subsequently she does not have an appointment for this yet that is going to complicate things we obviously cannot put her back in the cast if we do not have everything measured because they're not gonna be able to measure her foot while she is in the cast. Unfortunately the other thing that I found out today as well is that she was in the hospital over the weekend due to having Heather heroin overdose. Obviously this is unfortunate and does have me somewhat worried as well. 11/30/18 on evaluation today patient's toe ulcer actually appears to be doing fairly well. The good news is she will be getting her  Heather Heather, Heather Heather ([id number]) brace in the shoes next week on Wednesday. Hopefully we will be able to get this to heal without having to go back in the cast however she may need the cast in order to get the wound completely heal and then go from there. Fortunately there's no signs of infection at this time. 12/07/18 on evaluation today patient fortunately did receive her brace and she states she could tell this definitely makes her walk better. With that being said she's been having issues with her toe where she noticed yesterday there was  Heather lot of tissue that was loosing off this appears to be much larger than what it was previous. She also states that her leg has been read putting much across the top of her foot just about the ankle although this seems to be receiving somewhat. The total area is still red and appears to be someone infected as best I can tell. She is previously taken Bactrim and that may be Heather good option for her today as well. We are gonna see what I wound culture shows as well and I think that this is definitely appropriate. With that being said outside of the culture I still need to initiate something in the interim and that's what I'm gonna go ahead and select Bactrim is Heather good option for her. 12/14/18 on evaluation today patient appears to be doing better in regard to her left great toe ulcer as compared to last week's evaluation. There's still some erythema although this is significantly improved which is excellent news. Overall I do believe that she is making good progress is still gonna take some time before she is where I would like her to be from the standpoint of being able to place her back into the total contact cast. Hopefully we will be where we need to be by next week. Patient History Information obtained from Patient. Social History Current every [name] smoker. Medical History Eyes Patient has history of Cataracts, Glaucoma, Optic Neuritis Ear/Nose/Mouth/Throat Patient has history of Chronic sinus problems/congestion, Middle ear problems Endocrine Patient has history of Type II Diabetes Review of Systems (ROS) Constitutional Symptoms ([place]) Denies complaints or symptoms of Fever, Chills. Respiratory The patient has no complaints or symptoms. Cardiovascular The patient has no complaints or symptoms. Psychiatric The patient has no complaints or symptoms. Objective Constitutional Well-nourished and well-hydrated in no acute distress. Vitals Time Taken: 11:03 AM, Height: 69 in,  Weight: 240 lbs, BMI: 35.4, Temperature: 98.5 F, Pulse: 75 bpm, Respiratory Rate: 16 breaths/min, Blood Pressure: 95/66 mmHg. Heather Heather, Heather Heather ([id number]) Respiratory normal breathing without difficulty. clear to auscultation bilaterally. Cardiovascular regular rate and rhythm with normal S1, S2. Psychiatric this patient is able to make decisions and demonstrates good insight into disease process. Alert and Oriented x 3. pleasant and cooperative. General Notes: Patient's wound bed does show signs of improvement compared to last week's evaluation fortunately there is no signs of active spreading infection which is good news. Again I do believe her brace is doing an excellent job in helping her to walk without causing damage to her toe which is why the toe looks so well despite the fact she's not been in the cast. With that being said this is still something that is going to take some time to get to the point where we can put her back into the total contact cast. Integumentary (Hair, Skin) Wound #5 status is Open. Original cause of wound was Gradually Appeared. The wound is  located on the Left Toe Great. The wound measures 1.8cm length x 0.9cm width x 0.3cm depth; 1.272cm^2 area and 0.382cm^3 volume. There is Fat Layer (Subcutaneous Tissue) Exposed exposed. There is no tunneling or undermining noted. There is Heather medium amount of serous drainage noted. The wound margin is epibole. There is large (67-100%) red granulation within the wound bed. There is Heather small (1-33%) amount of necrotic tissue within the wound bed including Adherent Slough. The periwound skin appearance exhibited: Callus, Erythema. The periwound skin appearance did not exhibit: Crepitus, Excoriation, Induration, Rash, Scarring, Dry/Scaly, Maceration, Atrophie Blanche, Cyanosis, Ecchymosis, Hemosiderin Staining, Mottled, Pallor, Rubor. The surrounding wound skin color is noted with erythema which is circumferential. Periwound  temperature was noted as No Abnormality. 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 Anesthetic (add to Medication List): Wound #5 Left Toe Great: Topical Lidocaine 4% cream applied to wound bed prior to debridement (In Clinic Only). Primary Wound Dressing: Wound #5 Left Toe Great: Silver Alginate - Gentamicin on wound bed Secondary Dressing: Heather Heather, Heather Heather. ([id number]) Wound #5 Left Toe Great: Conform/Kerlix - foam Dressing Change Frequency: Wound #5 Left Toe Great: Three times weekly Follow-up Appointments: Wound #5 Left Toe Great: Return Appointment in 1 week. Medications-please add to medication list.: Wound #5 Left Toe Great: P.O. Antibiotics - Finish all antibiotics Topical Antibiotic My suggestion currently is gonna be that we continue with the above wound care measures the patient is in agreement the plan. Subsequently we will see were things stand at follow-up. If anything changes or worsens meantime she will contact the office and let me know. Please see above for specific wound care orders. We will see patient for re-evaluation in 1 week(s) here in the clinic. If anything worsens or changes patient will contact our office for additional recommendations. Electronic Signature(s) Signed: 12/14/2018 4:27:09 PM By: Heather Keeler PA-C Entered By: Heather Heather on 12/14/2018 12:46:50 Heather Heather ([id number]) -------------------------------------------------------------------------------- ROS/PFSH Details Patient Name: Heather Heather. Date of Service: 12/14/2018 11:00 AM Medical Record Number: [id number] Patient Account Number: [id number] Date of Birth/Sex: 23-Jan-1976 (43 y.o. F) Treating RN: Heather Heather Primary Care Provider: Priscille Heather Other  Clinician: Referring Provider: Priscille Heather Treating Provider/Extender: STONE III, [name] Weeks in Treatment: 18 Information Obtained From Patient Wound History Do you currently have one or more open woundso Yes How many open wounds do you currently haveo 1 Constitutional Symptoms (General Health) Complaints and Symptoms: Negative for: Fever; Chills Eyes Medical History: Positive for: Cataracts; Glaucoma; Optic Neuritis Ear/Nose/Mouth/Throat Medical History: Positive for: Chronic sinus problems/congestion; Middle ear problems Respiratory Complaints and Symptoms: No Complaints or Symptoms Cardiovascular Complaints and Symptoms: No Complaints or Symptoms Endocrine Medical History: Positive for: Type II Diabetes Time with diabetes: 10 years Treated with: Insulin, Oral agents Blood sugar tested every [name]: No Blood sugar testing results: Lunch: 350 Psychiatric Complaints and Symptoms: No Complaints or Symptoms HBO Extended History Items Marcussen, Comfort Heather. ([id number]) Eyes: Eyes: Ear/Nose/Mouth/Throat: Ear/Nose/Mouth/Throat: Cataracts Glaucoma Chronic sinus Middle ear problems problems/congestion Immunizations Pneumococcal Vaccine: Received Pneumococcal Vaccination: No Implantable Devices Family and Social History Current every [name] smoker Physician Affirmation I have reviewed and agree with the above information. Electronic Signature(s) Signed: 12/14/2018 4:27:09 PM By: Heather Keeler PA-C Signed: 12/14/2018 4:37:43 PM By: Heather Heather Entered By: Heather Heather  on 12/14/2018 12:46:09 Heather Heather, Heather Heather. ([id number]) -------------------------------------------------------------------------------- SuperBill Details Patient Name: Heather Heather, Heather Heather. Date of Service: 12/14/2018 Medical Record Number: [id number] Patient Account Number: [id number] Date of Birth/Sex: 09-04-1976 (43 y.o. F) Treating RN: Heather Heather Primary Care Provider: Priscille Heather Other  Clinician: Referring Provider: Priscille Heather Treating Provider/Extender: Heather Heather Weeks in Treatment: 18 Diagnosis Coding ICD-10 Codes Code Description E11.621 Type 2 diabetes mellitus with foot ulcer L97.522 Non-pressure chronic ulcer of other part of left foot with fat layer exposed M21.372 Foot drop, left foot Facility Procedures CPT4 Code: [id number] Description: [phone] - WOUND CARE VISIT-LEV 2 EST PT Modifier: Quantity: 1 Physician Procedures CPT4 Code: [id number] Description: 32951 - WC PHYS LEVEL 4 - NEW PT ICD-10 Diagnosis Description E11.621 Type 2 diabetes mellitus with foot ulcer L97.522 Non-pressure chronic ulcer of other part of left foot with fa M21.372 Foot drop, left foot Modifier: t layer exposed Quantity: 1 Electronic Signature(s) Signed: 12/14/2018 4:27:09 PM By: Heather Keeler PA-C Entered By: Heather Heather on 12/14/2018 12:49:22

## 2018-12-21 ENCOUNTER — Encounter: Payer: Medicaid Other | Admitting: Physician Assistant

## 2018-12-21 ENCOUNTER — Other Ambulatory Visit
Admission: RE | Admit: 2018-12-21 | Discharge: 2018-12-21 | Disposition: A | Discharge status: Home / Self Care | Payer: Medicaid Other | Source: Ambulatory Visit | Attending: Physician Assistant | Admitting: Physician Assistant

## 2018-12-21 DIAGNOSIS — E11621 Type 2 diabetes mellitus with foot ulcer: Secondary | ICD-10-CM | POA: Insufficient documentation

## 2018-12-21 DIAGNOSIS — L97522 Non-pressure chronic ulcer of other part of left foot with fat layer exposed: Secondary | ICD-10-CM | POA: Insufficient documentation

## 2018-12-25 NOTE — Progress Notes (Signed)
JALYNNE, PERSICO ([id number]) Visit Report for 12/21/2018 Arrival Information Details Patient Name: [name], [name] Date of Service: 12/21/2018 10:45 AM Medical Record Number: [id number] Patient Account Number: [id number] Date of Birth/Sex: [birth year] (43 y.o. F) Treating RN: Cornell Barman Primary Care Season Astacio: Priscille Kluver Other Clinician: Referring Aneshia Jacquet: Priscille Kluver Treating Camari Quintanilla/Extender: Melburn Hake, HOYT Weeks in Treatment: 19 Visit Information History Since Last Visit Added or deleted any medications: No Patient Arrived: Wheel Chair Any new allergies or adverse reactions: No Arrival Time: 10:52 Had a fall or experienced change in No activities of daily living that may affect Accompanied By: mother risk of falls: Transfer Assistance: None Signs or symptoms of abuse/neglect since last visito No Patient Identification Verified: Yes Hospitalized since last visit: No Secondary Verification Process Completed: Yes Implantable device outside of the clinic excluding No Patient Requires Transmission-Based No cellular tissue based products placed in the center Precautions: since last visit: Patient Has Alerts: No Has Dressing in Place as Prescribed: Yes Pain Present Now: Yes Electronic Signature(s) Signed: 12/21/2018 3:07:51 PM By: Lorine Bears RCP, RRT, CHT Entered By: Lorine Bears on 12/21/2018 10:53:19 Gulyas, Morganne AMarland Kitchen ([id number]) -------------------------------------------------------------------------------- Clinic Level of Care Assessment Details Patient Name: Heather Dear A. Date of Service: 12/21/2018 10:45 AM Medical Record Number: [id number] Patient Account Number: [id number] Date of Birth/Sex: Jan 12, 1976 (43 y.o. F) Treating RN: Cornell Barman Primary Care Temeka Pore: Priscille Kluver Other Clinician: Referring Chayim Bialas: Priscille Kluver Treating Shelley Cocke/Extender: Melburn Hake, HOYT Weeks in Treatment: 19 Clinic Level of Care Assessment  Items TOOL 4 Quantity Score []  - Use when only an EandM is performed on FOLLOW-UP visit 0 ASSESSMENTS - Nursing Assessment / Reassessment []  - Reassessment of Co-morbidities (includes updates in patient status) 0 X- 1 5 Reassessment of Adherence to Treatment Plan ASSESSMENTS - Wound and Skin Assessment / Reassessment X - Simple Wound Assessment / Reassessment - one wound 1 5 []  - 0 Complex Wound Assessment / Reassessment - multiple wounds []  - 0 Dermatologic / Skin Assessment (not related to wound area) ASSESSMENTS - Focused Assessment []  - Circumferential Edema Measurements - multi extremities 0 []  - 0 Nutritional Assessment / Counseling / Intervention []  - 0 Lower Extremity Assessment (monofilament, tuning fork, pulses) []  - 0 Peripheral Arterial Disease Assessment (using hand held doppler) ASSESSMENTS - Ostomy and/or Continence Assessment and Care []  - Incontinence Assessment and Management 0 []  - 0 Ostomy Care Assessment and Management (repouching, etc.) PROCESS - Coordination of Care X - Simple Patient / Family Education for ongoing care 1 15 []  - 0 Complex (extensive) Patient / Family Education for ongoing care X- 1 10 Staff obtains Programmer, systems, Records, Test Results / Process Orders []  - 0 Staff telephones HHA, Nursing Homes / Clarify orders / etc []  - 0 Routine Transfer to another Facility (non-emergent condition) []  - 0 Routine Hospital Admission (non-emergent condition) []  - 0 New Admissions / Biomedical engineer / Ordering NPWT, Apligraf, etc. []  - 0 Emergency Hospital Admission (emergent condition) X- 1 10 Simple Discharge Coordination Revoir, Grey A. ([id number]) []  - 0 Complex (extensive) Discharge Coordination PROCESS - Special Needs []  - Pediatric / Minor Patient Management 0 []  - 0 Isolation Patient Management []  - 0 Hearing / Language / Visual special needs []  - 0 Assessment of Community assistance (transportation, D/C planning, etc.) []  -  0 Additional assistance / Altered mentation []  - 0 Support Surface(s) Assessment (bed, cushion, seat, etc.) INTERVENTIONS - Wound Cleansing / Measurement X - Simple Wound Cleansing - one wound 1 5 []  -  0 Complex Wound Cleansing - multiple wounds X- 1 5 Wound Imaging (photographs - any number of wounds) []  - 0 Wound Tracing (instead of photographs) X- 1 5 Simple Wound Measurement - one wound []  - 0 Complex Wound Measurement - multiple wounds INTERVENTIONS - Wound Dressings []  - Small Wound Dressing one or multiple wounds 0 X- 1 15 Medium Wound Dressing one or multiple wounds []  - 0 Large Wound Dressing one or multiple wounds []  - 0 Application of Medications - topical []  - 0 Application of Medications - injection INTERVENTIONS - Miscellaneous []  - External ear exam 0 []  - 0 Specimen Collection (cultures, biopsies, blood, body fluids, etc.) []  - 0 Specimen(s) / Culture(s) sent or taken to Lab for analysis []  - 0 Patient Transfer (multiple staff / Civil Service fast streamer / Similar devices) []  - 0 Simple Staple / Suture removal (25 or less) []  - 0 Complex Staple / Suture removal (26 or more) []  - 0 Hypo / Hyperglycemic Management (close monitor of Blood Glucose) []  - 0 Ankle / Brachial Index (ABI) - do not check if billed separately X- 1 5 Vital Signs Bureau, Tava A. ([id number]) Has the patient been seen at the hospital within the last three years: Yes Total Score: 80 Level Of Care: New/Established - Level 3 Electronic Signature(s) Signed: 12/24/2018 4:00:49 PM By: Gretta Cool, BSN, RN, CWS, [name] RN, BSN Entered By: Gretta Cool, BSN, RN, CWS, [name] on 12/21/2018 11:59:54 [name] ([id number]) -------------------------------------------------------------------------------- Encounter Discharge Information Details Patient Name: Heather Dear A. Date of Service: 12/21/2018 10:45 AM Medical Record Number: [id number] Patient Account Number: [id number] Date of Birth/Sex: [birth year] (43 y.o.  F) Treating RN: Army Melia Primary Care Sherida Dobkins: Priscille Kluver Other Clinician: Referring Yurika Pereda: Priscille Kluver Treating Resa Rinks/Extender: Melburn Hake, HOYT Weeks in Treatment: 55 Encounter Discharge Information Items Discharge Condition: Stable Ambulatory Status: Wheelchair Discharge Destination: Home Transportation: Private Auto Accompanied By: caregiver Schedule Follow-up Appointment: Yes Clinical Summary of Care: Electronic Signature(s) Signed: 12/21/2018 12:47:49 PM By: Army Melia Entered By: Army Melia on 12/21/2018 12:11:32 [name] ([id number]) -------------------------------------------------------------------------------- Lower Extremity Assessment Details Patient Name: Heather Dear A. Date of Service: 12/21/2018 10:45 AM Medical Record Number: [id number] Patient Account Number: [id number] Date of Birth/Sex: [birth year] (43 y.o. F) Treating RN: Secundino Ginger Primary Care Kelwin Gibler: Priscille Kluver Other Clinician: Referring Lashon Beringer: Priscille Kluver Treating Nycere Presley/Extender: STONE III, HOYT Weeks in Treatment: 19 Edema Assessment Assessed: [Left: No] [Right: No] Edema: [Left: N] [Right: o] Calf Left: Right: Point of Measurement: 30 cm From Medial Instep 29.5 cm cm Ankle Left: Right: Point of Measurement: 11 cm From Medial Instep 19 cm cm Vascular Assessment Claudication: Claudication Assessment [Left:None] Pulses: Dorsalis Pedis Palpable: [Left:Yes] Posterior Tibial Extremity colors, hair growth, and conditions: Extremity Color: [Left:Normal] Hair Growth on Extremity: [Left:Yes] Temperature of Extremity: [Left:Warm] Capillary Refill: [Left:< 3 seconds] Toe Nail Assessment Left: Right: Thick: No Discolored: No Deformed: No Improper Length and Hygiene: No Electronic Signature(s) Signed: 12/21/2018 2:18:07 PM By: Secundino Ginger Entered By: Secundino Ginger on 12/21/2018 11:13:15 Till, Tempestt A.  ([id number]) -------------------------------------------------------------------------------- Multi Wound Chart Details Patient Name: Heather Dear A. Date of Service: 12/21/2018 10:45 AM Medical Record Number: [id number] Patient Account Number: [id number] Date of Birth/Sex: 1976-08-20 (43 y.o. F) Treating RN: Cornell Barman Primary Care Marquee Fuchs: Priscille Kluver Other Clinician: Referring Donnis Phaneuf: Priscille Kluver Treating Mariya Mottley/Extender: Melburn Hake, HOYT Weeks in Treatment: 19 Vital Signs Height(in): 69 Pulse(bpm): 90 Weight(lbs): 240 Blood Pressure(mmHg): 109/73 Body Mass Index(BMI): 35 Temperature(F): 98.3 Respiratory Rate 16 (breaths/min): Photos: [N/A:N/A] Wound  Location: Left Toe Great N/A N/A Wounding Event: Gradually Appeared N/A N/A Primary Etiology: Diabetic Wound/Ulcer of the N/A N/A Lower Extremity Comorbid History: Cataracts, Glaucoma, Optic N/A N/A Neuritis, Chronic sinus problems/congestion, Middle ear problems, Type II Diabetes Date Acquired: 11/16/2018 N/A N/A Weeks of Treatment: 4 N/A N/A Wound Status: Open N/A N/A Measurements L x W x D 2.5x1.5x0.3 N/A N/A (cm) Area (cm) : 2.945 N/A N/A Volume (cm) : 0.884 N/A N/A % Reduction in Area: -649.40% N/A N/A % Reduction in Volume: -463.10% N/A N/A Classification: Grade 3 N/A N/A Exudate Amount: Medium N/A N/A Exudate Type: Serous N/A N/A Exudate Color: amber N/A N/A Wound Margin: Epibole N/A N/A Granulation Amount: None Present (0%) N/A N/A Necrotic Amount: Small (1-33%) N/A N/A Exposed Structures: Fat Layer (Subcutaneous N/A N/A Tissue) Exposed: Yes Fascia: No Tendon: No Muscle: No Jacobowitz, Lekeshia A. ([id number]) Joint: No Bone: No Epithelialization: None N/A N/A Periwound Skin Texture: Callus: Yes N/A N/A Excoriation: No Induration: No Crepitus: No Rash: No Scarring: No Periwound Skin Moisture: Maceration: Yes N/A N/A Dry/Scaly: No Periwound Skin Color: Erythema: Yes N/A N/A Atrophie Blanche:  No Cyanosis: No Ecchymosis: No Hemosiderin Staining: No Mottled: No Pallor: No Rubor: No Erythema Location: Circumferential N/A N/A Temperature: No Abnormality N/A N/A Tenderness on Palpation: No N/A N/A Wound Preparation: Ulcer Cleansing: N/A N/A Rinsed/Irrigated with Saline Topical Anesthetic Applied: Other: lidocaine 4% Treatment Notes Electronic Signature(s) Signed: 12/24/2018 4:00:49 PM By: Gretta Cool, BSN, RN, CWS, [name] RN, BSN Entered By: Gretta Cool, BSN, RN, CWS, [name] on 12/21/2018 11:58:12 [name] ([id number]) -------------------------------------------------------------------------------- [place] Details Patient Name: Heather Dear A. Date of Service: 12/21/2018 10:45 AM Medical Record Number: [id number] Patient Account Number: [id number] Date of Birth/Sex: [birth year] (43 y.o. F) Treating RN: Cornell Barman Primary Care Dajae Kizer: Priscille Kluver Other Clinician: Referring Alvita Fana: Priscille Kluver Treating Lasaundra Riche/Extender: Melburn Hake, HOYT Weeks in Treatment: 9 Active Inactive Abuse / Safety / Falls / Self Care Management Nursing Diagnoses: History of Falls Goals: Patient will not experience any injury related to falls Date Initiated: 08/07/2018 Target Resolution Date: 10/19/2018 Goal Status: Active Interventions: Assess fall risk on admission and as needed Notes: Orientation to the Wound Care Program Nursing Diagnoses: Knowledge deficit related to the wound healing center program Goals: Patient/caregiver will verbalize understanding of the [place] Date Initiated: 08/07/2018 Target Resolution Date: 10/19/2018 Goal Status: Active Interventions: Provide education on orientation to the wound center Notes: Soft Tissue Infection Nursing Diagnoses: Impaired tissue integrity Goals: Patient will remain free of wound infection Date Initiated: 08/07/2018 Target Resolution Date: 10/19/2018 Goal Status:  Active Interventions: Assess signs and symptoms of infection every visit Doswell, [name] A. ([id number]) Notes: Wound/Skin Impairment Nursing Diagnoses: Impaired tissue integrity Goals: Ulcer/skin breakdown will heal within 14 weeks Date Initiated: 08/07/2018 Target Resolution Date: 10/19/2018 Goal Status: Active Interventions: Assess patient/caregiver ability to obtain necessary supplies Notes: Electronic Signature(s) Signed: 12/24/2018 4:00:49 PM By: Gretta Cool, BSN, RN, CWS, [name] RN, BSN Entered By: Gretta Cool, BSN, RN, CWS, [name] on 12/21/2018 11:53:23 [name] ([id number]) -------------------------------------------------------------------------------- Pain Assessment Details Patient Name: Heather Dear A. Date of Service: 12/21/2018 10:45 AM Medical Record Number: [id number] Patient Account Number: [id number] Date of Birth/Sex: 07/21/1976 (43 y.o. F) Treating RN: Cornell Barman Primary Care Lamine Laton: Priscille Kluver Other Clinician: Referring Nikolaus Pienta: Priscille Kluver Treating Gaberial Cada/Extender: Melburn Hake, HOYT Weeks in Treatment: 19 Active Problems Location of Pain Severity and Description of Pain Patient Has Paino Yes Site Locations Rate the pain. Current Pain Level: 5 Pain Management and  Medication Current Pain Management: Electronic Signature(s) Signed: 12/21/2018 3:07:51 PM By: Lorine Bears RCP, RRT, CHT Signed: 12/24/2018 4:00:49 PM By: Gretta Cool, BSN, RN, CWS, [name] RN, BSN Entered By: Lorine Bears on 12/21/2018 10:53:30 [name] ([id number]) -------------------------------------------------------------------------------- Patient/Caregiver Education Details Patient Name: Heather Dear A. Date of Service: 12/21/2018 10:45 AM Medical Record Number: [id number] Patient Account Number: [id number] Date of Birth/Gender: 1976-05-12 (43 y.o. F) Treating RN: Cornell Barman Primary Care Physician: Priscille Kluver Other Clinician: Referring Physician: Priscille Kluver Treating Physician/Extender: Sharalyn Ink in Treatment: 74 Education Assessment Education Provided To: Patient Education Topics Provided Wound/Skin Impairment: Handouts: Caring for Your Ulcer, Other: continue wound care as prescribed Methods: Demonstration, Explain/Verbal Responses: State content correctly Electronic Signature(s) Signed: 12/24/2018 4:00:49 PM By: Gretta Cool, BSN, RN, CWS, [name] RN, BSN Entered By: Gretta Cool, BSN, RN, CWS, [name] on 12/21/2018 12:00:29 [name] ([id number]) -------------------------------------------------------------------------------- Wound Assessment Details Patient Name: Heather Dear A. Date of Service: 12/21/2018 10:45 AM Medical Record Number: [id number] Patient Account Number: [id number] Date of Birth/Sex: [birth year] (43 y.o. F) Treating RN: Secundino Ginger Primary Care Sheryle Vice: Priscille Kluver Other Clinician: Referring Charma Mocarski: Priscille Kluver Treating Janella Rogala/Extender: STONE III, HOYT Weeks in Treatment: 19 Wound Status Wound Number: 5 Primary Diabetic Wound/Ulcer of the Lower Extremity Etiology: Wound Location: Left Toe Great Wound Open Wounding Event: Gradually Appeared Status: Date Acquired: 11/16/2018 Comorbid Cataracts, Glaucoma, Optic Neuritis, Chronic Weeks Of Treatment: 4 History: sinus problems/congestion, Middle ear problems, Clustered Wound: No Type II Diabetes Photos Photo Uploaded By: Secundino Ginger on 12/21/2018 11:22:43 Wound Measurements Length: (cm) 2.5 Width: (cm) 1.5 Depth: (cm) 0.3 Area: (cm) 2.945 Volume: (cm) 0.884 % Reduction in Area: -649.4% % Reduction in Volume: -463.1% Epithelialization: None Tunneling: No Undermining: No Wound Description Classification: Grade 3 Foul Odor Wound Margin: Epibole Slough/Fi Exudate Amount: Medium Exudate Type: Serous Exudate Color: amber After Cleansing: No brino No Wound Bed Granulation Amount: None Present (0%) Exposed Structure Necrotic Amount: Small  (1-33%) Fascia Exposed: No Necrotic Quality: Adherent Slough Fat Layer (Subcutaneous Tissue) Exposed: Yes Tendon Exposed: No Muscle Exposed: No Joint Exposed: No Bone Exposed: No Periwound Skin Texture Hearty, Alania A. ([id number]) Texture Color No Abnormalities Noted: No No Abnormalities Noted: No Callus: Yes Atrophie Blanche: No Crepitus: No Cyanosis: No Excoriation: No Ecchymosis: No Induration: No Erythema: Yes Rash: No Erythema Location: Circumferential Scarring: No Hemosiderin Staining: No Mottled: No Moisture Pallor: No No Abnormalities Noted: No Rubor: No Dry / Scaly: No Maceration: Yes Temperature / Pain Temperature: No Abnormality Wound Preparation Ulcer Cleansing: Rinsed/Irrigated with Saline Topical Anesthetic Applied: Other: lidocaine 4%, Treatment Notes Wound #5 (Left Toe Great) Notes gentamicin cream, silvercel, gauze and conform Electronic Signature(s) Signed: 12/21/2018 2:18:07 PM By: Secundino Ginger Entered By: Secundino Ginger on 12/21/2018 11:10:37 [name] ([id number]) -------------------------------------------------------------------------------- Vitals Details Patient Name: Heather Dear A. Date of Service: 12/21/2018 10:45 AM Medical Record Number: [id number] Patient Account Number: [id number] Date of Birth/Sex: Jul 12, 1976 (43 y.o. F) Treating RN: Cornell Barman Primary Care [name] Shadwick: Priscille Kluver Other Clinician: Referring Kenyon Eichelberger: Priscille Kluver Treating Liley Rake/Extender: Melburn Hake, HOYT Weeks in Treatment: 19 Vital Signs Time Taken: 10:53 Temperature (F): 98.3 Height (in): 69 Pulse (bpm): 90 Weight (lbs): 240 Respiratory Rate (breaths/min): 16 Body Mass Index (BMI): 35.4 Blood Pressure (mmHg): 109/73 Reference Range: 80 - 120 mg / dl Electronic Signature(s) Signed: 12/21/2018 3:07:51 PM By: Lorine Bears RCP, RRT, CHT Entered By: Lorine Bears on 12/21/2018 10:55:54

## 2018-12-25 NOTE — Progress Notes (Addendum)
[name], [name] ([id number]) Visit Report for 12/21/2018 Chief Complaint Document Details Patient Name: [name] Date of Service: 12/21/2018 10:45 AM Medical Record Number: [id number] Patient Account Number: [id number] Date of Birth/Sex: [birth year] (43 y.o. F) Treating RN: Cornell Barman Primary Care Provider: Priscille Kluver Other Clinician: Referring Provider: Priscille Kluver Treating Provider/Extender: Melburn Hake, HOYT Weeks in Treatment: 19 Information Obtained from: Patient Chief Complaint Left great toe ulcer Electronic Signature(s) Signed: 12/21/2018 5:19:05 PM By: Worthy Keeler PA-C Entered By: Worthy Keeler on 12/21/2018 10:54:01 [name] ([id number]) -------------------------------------------------------------------------------- HPI Details Patient Name: [name] Date of Service: 12/21/2018 10:45 AM Medical Record Number: [id number] Patient Account Number: [id number] Date of Birth/Sex: April 05, 1976 (43 y.o. F) Treating RN: Cornell Barman Primary Care Provider: Priscille Kluver Other Clinician: Referring Provider: Priscille Kluver Treating Provider/Extender: Melburn Hake, HOYT Weeks in Treatment: 19 History of Present Illness HPI Description: 01/18/18-She is here for initial evaluation of the left great toe ulcer. She is [name] poor historian in regards to timeframe in detail. She states approximately 4 weeks ago she lacerated her toe on something in the house. She followed up with her primary care who placed her on Bactrim and ultimately [name] second dose of Bactrim prior to coming to wound clinic. She states she has been treating the toe with peroxide, Betadine and [name] Band-Aid. She did not check her blood sugar this morning but checked it yesterday morning it was 327; she is unaware of [name] recent A1c and there are no current records. She saw Dr. she would've orthopedics last week for an old injury to the left ankle, she states he did not see her toe, nor did she bring it to his  attention. She smokes approximately 1 pack cigarettes [name] day. Her social situation is concerning, she arrives this morning with her mother who appears extremely intoxicated/under the influence; her mother was asked to leave the room and be monitored by the patient's grandmother. The patient's aunt then accompanied the patient and the room throughout the rest of the appointment. We had [name] lengthy discussion regarding the deleterious effects of uncontrolled hyperglycemia and smoking as it relates to wound healing and overall health. She was strongly encouraged to decrease her smoking and get her diabetes under better control. She states she is currently on [name] diet and has cut down her [facility] consumption. The left toe is erythematous, macerated and slightly edematous with malodor present. The edema in her left foot is below her baseline, there is no erythema streaking. We will treat her with Santyl, doxycycline; we have ordered and xray, culture and provided [name] Peg assist surgical shoe and cultured the wound. 01/25/18-She is here in follow-up evaluation for [name] left great toe ulcer and presents with an abscess to her suprapubic area. She states her blood sugars remain elevated, feeling "sick" and if levels are below 250, but she is trying. She has made no attempt to decrease her smoking stating that we "can't take away her food in her cigarettes". She has been compliant with offloading using the PEG assist you. She is using Santyl daily. the culture obtained last week grew staph aureus and Enterococcus faecalis; continues on the doxycycline and Augmentin was added on Monday. The suprapubic area has erythema, no femoral variation, purple discoloration, minimal induration, was accessed with [name] cotton tip applicator with sanguinopurulent drainage, this was cultured, I suspect the current antibiotic treatment will cover and we will not add anything to her current treatment plan. She was advised  to go to urgent  care or ER with any change in redness, induration or fever. 02/01/18-She is here in follow-up evaluation for left great toe ulcers and [name] new abdominal abscess from last week. She was able to use packing until earlier this week, where she "forgot it was there". She states she was feeling ill with GI symptoms last week and was not taking her antibiotic. She states her glucose levels have been predominantly less than 200, with occasional levels between 200-250. She thinks this was contributing to her GI symptoms as they have resolved without intervention. There continues to be significant laceration to left toe, otherwise it clinically looks stable/improved. There is now less superficial opening to the lateral aspect of the great toe that was residual blister. We will transition to [facility] to all wounds, she will continue her Augmentin. If there is no change or deterioration next week for reculture. 02/08/18-She is here in follow-up evaluation for left great toe ulcer and abdominal ulcer. There is an improvement in both wounds. She has been wrapping her left toe with coban, not by our direction, which has created an area of discoloration to the medial aspect; she has been advised to NOT use coban secondary to her neuropathy. She states her glucose levels have been high over this last week ranging from 200-350, she continues to smoke. She admits to being less compliant with her offloading shoe. We will continue with same treatment plan and she will follow-up next week. 02/15/18-She is here in follow-up evaluation for left great toe ulcer and abdominal ulcer. The abdominal ulcer is epithelialized. The left great toe ulcer is improved and all injury from last week using the Coban wrap is resolved, the lateral ulcer is healed. She admits to noncompliance with wearing offloading shoe and admits to glucose levels being greater than 300 most of the week. She continues to smoke and expresses no desire to  quit. There is one area medially that probes deeper than it has historically, erythema to the toe and dorsal foot has consistently waxed and waned. There is no overt signs of cellulitis or infection but we will culture the wound for any occult infection given the new area of depth and erythema. We will hold off on sensitivities for initiation of antibiotic therapy. 02/22/18-She is here in follow up evaluation for left great toe ulcer. There is overall significant improvement in both wound appearance, erythema and edema with changes made last week. She was not initiated on antibiotic therapy. Culture obtained last week showed oxacillin sensitive staph aureus, sensitive to clindamycin. Clindamycin has been called into the pharmacy but she has been instructed to hold off on initiation secondary to overall clinical improvement and her history of antibiotic intolerance. She has been instructed to contact the clinic with any noted changes/deterioration and the wound, erythema, [place]icch[place], Kenyon [name]. ([id number]) edema and/or pain. She will follow-up next week. She continues to smoke and her glucose levels remain elevated >250; she admits to compliance with offloading shoe 03/01/18 on evaluation today patient appears to be doing fairly well in regard to her left first toe ulcer. She has been tolerating the dressing changes with the [facility] Dressing without complication and overall this has definitely showed signs of improvement according to records as well is what the patient tells me today. I'm very pleased in that regard. She is having no pain today 03/08/18 She is here for follow up evaluation of [name] left great toe ulcer. She remains non-compliant with glucose control  and smoking cessation; glucose levels consistently >200. She states that she got new shoe inserts/peg assist. She admits to compliance with offloading. Since my last evaluation there is significant improvement. We will switch to prisma at this  time and she will follow up next week. She is noted to be tachycardic at this appointment, heart rate 120s; she has [name] history of heart rate 70-130 according to our records. She admits to extreme agitation r/t personal issues; she was advised to monitor her heartrate and contact her physician if it does not return to [name] more normal range (<100). She takes cardizem twice daily. 03/15/18-She is here in follow-up evaluation for left great toe ulcer. She remains noncompliant with glucose control and smoking cessation. She admits to compliance with wearing offloading shoe. The ulcer is improved/stable and we will continue with the same treatment plan and she will follow-up next week 03/22/18-She is here for evaluation for left great toe ulcer. There continues to be significant improvement despite recurrent hyperglycemia (over 500 yesterday) and she continues to smoke. She has been compliant with offloading and we will continue with same treatment plan and she will follow-up next week. 03/29/18-She is here for evaluation for left great toe ulcer. Despite continuing to smoke and uncontrolled diabetes she continues to improve. She is compliant with offloading shoe. We will continue with the same treatment plan and she will follow-up next week 04/05/18- She is here in follow up evaluation for [name] left great toe ulcer; she presents with small pustule to left fifth toe (resembles ant bite). She admits to compliance with wearing offloading shoe; continues to smoke or have uncontrolled blood glucose control. There is more callus than usual with evidence of bleeding; she denies known trauma. 04/12/18-She is here for evaluation of left great toe ulcer. Despite noncompliance with glycemic control and smoking she continues to make improvement. She continues to wear offloading shoe. The pustule, that was identified last week, to the left fifth toe is resolved. She will follow-up in 2 weeks 05/03/18-she is seen in follow-up  evaluation for [name] left great toe ulcer. She is compliant with offloading, otherwise noncompliant with glycemic control and smoking. She has plateaued and there is minimal improvement noted. We will transition to [facility], replaced the insert to her surgical shoe and she will follow-up in one week 05/10/18- She is here in follow up evaluation for [name] left great toe ulcer. It appears stable despite measurement change. We will continue with same treatment plan and follow up next week. 05/24/18-She is seen in follow-up evaluation for [name] left great toe ulcer. She remains compliant with offloading, has made significant improvement in her diet, decreasing the amount of sugar/soda. She said her recent A1c was 10.9 which is lower than. She did see [name] diabetic nutritionist/educator yesterday. She continues to smoke. We will continue with the same treatment plan and she'll follow-up next week. 05/31/18- She is seen in follow-up evaluation for left great toe ulcer. She continues to remain compliant with offloading, continues to make improvement in her diet, increasing her water and decreasing the amount of sugar/soda. She does continue to smoke with no desire to quit. We will apply Prisma to the depth and Hydrofera Blue over. We have not received insurance authorization for oasis. She will follow up next week. 06/07/18-She is seen in follow-up evaluation for left great toe ulcer. It has stalled according to today's measurements although base appears stable. She says she saw [name] diabetic educator yesterday; her average blood sugars are  less than 300 which is an improvement for her. She continues to smoke and states "that's my next step" She continues with water over soda. We will order for xray, culture and reinstate ace wrap compression prior to placing apligraf for next week. She is voicing no complaints or concerns. Her dressing will change to iodoflex over the next week in preparation for apligraf. 06/14/18-She is  seen in follow-up evaluation for left great toe ulcer. Plain film x-ray performed last week was negative for osteomyelitis. Wound culture obtained last week grew strep B and OSSA; she is initiated on keflex and cefdinir today; there is erythema to the toe which could be from ace wrap compression, she has [name] history of wrapping too tight and has has been encouraged to maintain ace wraps that we place today. We will hold off on application of apligraf today, will apply next week after antibiotic therapy has been initiated. She admits today that she has resumed taking [name] shower with her foot/toe submerged in water, she has been reminded to keep foot/toe out of the bath water. She will be seen in follow up next week 06/21/18-she is seen in follow-up evaluation for left great toe ulcer. She is tolerating antibiotic therapy with no GI disturbance. The wound is stable. Apligraf was applied today. She has been decreasing her smoking, only had 4 cigarettes yesterday and 1 today. She continues being more compliant in diabetic diet. She will follow-up next week for evaluation of site, if stable will remove at 2 weeks. 06/28/18- She is here in follow up evalution. Apligraf was placed last week, she states the dressing fell off on Tuesday and she was dressing with hydrofera blue. She is healed and will be discharged from the clinic today. She has been instructed to continue with smoking cessation, continue monitoring glucose levels, offloading for an additional 4 weeks and continue with [place], Lindsay [name]. ([id number]) hydrofera blue for additional two weeks for any possible microscopic opening. Readmission: 08/07/18 on evaluation today patient presents for reevaluation concerning the ulcer of her right great toe. She was previously discharged on 06/28/18 healed. Nonetheless she states that this began to show signs of drainage she subsequently went to her primary care provider. Subsequently an x-ray was performed on  08/01/18 which was negative. The patient was also placed on antibiotics at that time. Fortunately they should have been effective for the infection. Nonetheless she's been experiencing some improvement but still has [name] lot of drainage coming from the wound itself. 08/14/18 on evaluation today patient's wound actually does show signs of improvement in regard to the erythema at this point. She has completed the antibiotics. With that being said we did discuss the possibility of placing her in [name] total contact cast as of today although I think that I may want to give this just [name] little bit more time to ensure nothing recurrence as far as her infection is concerned. I do not want to put in the cast and risk infection at that time if things are not completely resolved. With that being said she is gonna require some debridement today. 08/21/18 on evaluation today patient actually appears to be doing okay in regard to her toe ulcer. She's been tolerating the dressing changes without complication. With that being said it does appear that she is ready and in fact I think it's appropriate for [place] to go ahead and initiate the total contact cast today. Nonetheless she will require some sharp debridement to prepare the wound for application. Overall  I feel like things have been progressing well but we do need to do something to get this to close more readily. 08/24/18 patient seen today for reevaluation after having had the total contact cast applied on Tuesday. She seems to have done very well the wound appears to be doing great and overall I'm pleased with the progress that she's made. There were no abnormal areas of rubbing from the cast on her lower extremity. 08/30/18 on evaluation today patient actually appears to be completely healed in regard to her plantar toe ulcer. She tells me at this point she's been having [name] lot of issues with the cast. She almost fell [name] couple of times the state shall the step of her  dog [name] couple times as well. This is been [name] very frustrating process for her other nonetheless she has completely healed the wound which is excellent news. Overall there does not appear to be the evidence of infection at this time which is great news. 09/11/18 evaluation today patient presents for follow-up concerning her great toe ulcer on the left which has unfortunately reopened since I last saw her which was only [name] couple of weeks ago. Unfortunately she was not able to get in to get the shoe and potentially the AFO that's gonna be necessary due to her left foot drop. She continues with offloading shoe but this is not enough to prevent her from reopening it appears. When we last had her in the total contact cast she did well from [name] healing standpoint but unfortunately the wound reopened as soon as she came out of the cast within just [name] couple of weeks. 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 This is also part of the reason why she falls on [name] regular basis. I just do not believe that is gonna be safe for [place] to reinitiate the total contact cast as last time we had this on she fell 3 times one week which is definitely not normal for her. 09/18/18 upon evaluation today the patient actually appears to be doing about the same in regard to her toe ulcer. She did not contact [employer] as I asked her to even though I had given her the prescription. In fact she actually states that she has no idea where the prescription is. She did apparently call [employer] and they told her that all she needed to do was bring the prescription in order to be able to be seen and work on getting the AFO for her left foot. With all that being said she still does not have an appointment and I'm not sure were things stand that regard.  I will give her [name] new prescription today in order to contact them to get this set up. 09/25/18 on evaluation today patient actually appears to be doing about the same in regard to her toes ulcer. She does have [name] small areas which seems to have [name] lot of callous buildup around the edge of the wound which is going to need sharp debridement today. She still is waiting to be scheduled for evaluation with [employer] for possibility of an AFO. She states there supposed to call her tomorrow to get this set up. Unfortunately it does appear that her foot specifically the toe area is showing signs  of erythema. There does not appear to be any systemic infection which is in these good news. 10/02/18 on evaluation today patient actually appears to be doing about the same in regard to her toe ulcer. This really has not done too well although it's not significantly larger it's also not significantly smaller. She has been tolerating the dressing changes without complication. She actually has her appointment with [employer] and [place] tomorrow to hopefully be measured for obtaining and AFO splint. I think this would be helpful preventing this from reoccurring. We had contemplated starting the cast this week although to be honest I am reluctant to do that as she's been having nausea, vomiting, and seizure activity over the past three days. She has [name] history of seizures and have been told is nothing that can be done for these. With [place], Brynnlie [name]. ([id number]) that being said I do believe that along with the seizures have the nausea vomiting which upon further questioning doesn't seem to be the normal for her and makes me concerned for the possibility of infection or something else going on. I discussed this with the patient and her mother during the office visit today. I do not feel the wound is effective but maybe something else. The responses this was "this just happens to her at times and we don't know why". They did  not seem to be interested in going to the hospital to have this checked out further. 10/09/18 on evaluation today patient presents for follow-up concerning her ongoing toe ulcer. She has been tolerating the dressing changes without complication. Fortunately there does not appear to be any evidence of infection which is great news however I do think that the patient would benefit from going ahead for with the total contact cast. She's actually in [name] wheelchair today she tells me that she will use her walker if we initiate the cast. I was very specific about the fact that if we were gonna do the cast I wanted to make sure that she was using the walker in order to prevent any falls. She tells me she does not have stairs that she has to traverse on [name] regular basis at her home. She has not had any seizures since last week again that something that happens to her often she tells me she did talk to [employer] and they said that it may take up to three weeks to get the brace approved for her. Hopefully that will not take that long but nonetheless in the meantime I do think the cast could be of benefit. 10/12/18 on evaluation today patient appears to be doing rather well in regard to her toe ulcer. It's just been [name] few days and already this is significantly improved both as far as overall appearance and size. Fortunately there's no sign of infection. She is here for her first obligatory cast change. 10/19/18 Seen today for follow up and management of left great toe ulcer. Wound continues to show improvement. Noted small open area with seroussang drainage with palpation. Denies any increased pain or recent fevers during visit. She will continue calcium alginate with offloading shoe. Denies any questions or concerns during visit. 10/26/18 on evaluation today patient appears to be doing about the same as when I last saw her in regard to her wound bed. Fortunately there does not appear to be any signs of infection.  Unfortunately she continues to have [name] breakdown in regard to the toe region any time that she is not in the cast.  It takes almost no time at all for this to happen. Nonetheless she still has not heard anything from the brace being made by [employer] as to when exactly this will be available to her. Fortunately there is no signs of infection at this time. 10/30/18 on evaluation today patient presents for application of the total contact cast as we just received him this morning. Fortunately we are gonna be able to apply this to her today which is great news. She continues to have no significant pain which is good news. Overall I do feel like things have been improving while she was the cast is when she doesn't have [name] cast that things get worse. She still has not really heard anything from [place] regarding her brace. 11/02/18 upon evaluation today patient's wound already appears to be doing significantly better which is good news. Fortunately there does not appear to be any signs of infection also good news. Overall I do think the total contact cast as before is helping to heal this area unfortunately it's just not gonna likely keep the area closed and healed without her getting her brace at least. Again the foot drop is [name] significant issue for her. 11/09/18 on evaluation today patient appears to be doing excellent in regard to her toe ulcer which in fact is completely healed. Fortunately we finally got the situation squared away with the paperwork which was needed to proceed with getting her brace approved by Medicaid. I have filled that out unfortunately that information has been sent to the orthopedic office that I worked at 2 1/2 years ago and not tired Current wound care measures. Fortunately she seems to be doing very well at this time. 11/23/18 on evaluation today patient appears to be doing More Poorly Compared to Last Time I Saw Her. At [facility] She Had Completely Healed. Currently she is  continuing to have issues with reopening. She states that she just found out that the brace was approved through Medicaid now she just has to go get measured in order to have this fitted for her and then made. Subsequently she does not have an appointment for this yet that is going to complicate things we obviously cannot put her back in the cast if we do not have everything measured because they're not gonna be able to measure her foot while she is in the cast. 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 Obviously this is unfortunate and does have me somewhat worried as well. 11/30/18 on evaluation today patient's toe ulcer actually appears to be doing fairly well. The good news is she will be getting her brace in the shoes next week on Wednesday. Hopefully we will be able to get this to heal without having to go back in the cast however she may need the cast in order to get the wound completely heal and then go from there. Fortunately there's no signs of infection at this time. 12/07/18 on evaluation today patient fortunately did receive her brace and she states she could tell this definitely makes her walk better. With that being said she's been having issues with her toe where she noticed yesterday there was [name] lot of tissue [place], Rhegan [name]. ([id number]) that was loosing off this appears to be much larger than what it was previous. She also states that her leg has been read putting much across the top of  her foot just about the ankle although this seems to be receiving somewhat. The total area is still red and appears to be someone infected as best I can tell. She is previously taken Bactrim and that may be [name] good option for her today as well. We are gonna see what I wound culture shows as well and I think that this is definitely appropriate. With that being said outside of the culture I still need to  initiate something in the interim and that's what I'm gonna go ahead and select Bactrim is [name] good option for her. 12/14/18 on evaluation today patient appears to be doing better in regard to her left great toe ulcer as compared to last week's evaluation. There's still some erythema although this is significantly improved which is excellent news. Overall I do believe that she is making good progress is still gonna take some time before she is where I would like her to be from the standpoint of being able to place her back into the total contact cast. Hopefully we will be where we need to be by next week. 12/21/18 on evaluation today patient actually appears to be doing poorly in regard to her toe ulcer. She's been tolerating the dressing changes without complication. Fortunately there's no signs of systemic infection although she does have [name] lot of drainage from the toe ulcer and this does seem to be causing some issues at this point. She does have erythema on the distal portion of her toe that appears to be likely cellulitis. Electronic Signature(s) Signed: 12/21/2018 5:19:05 PM By: Worthy Keeler PA-C Entered By: Worthy Keeler on 12/21/2018 17:10:57 [name] ([id number]) -------------------------------------------------------------------------------- Physical Exam Details Patient Name: [name] Date of Service: 12/21/2018 10:45 AM Medical Record Number: [id number] Patient Account Number: [id number] Date of Birth/Sex: December 30, 1975 (43 y.o. F) Treating RN: Cornell Barman Primary Care Provider: Priscille Kluver Other Clinician: Referring Provider: Priscille Kluver Treating Provider/Extender: STONE III, HOYT Weeks in Treatment: 40 Constitutional Well-nourished and well-hydrated in no acute distress. Respiratory normal breathing without difficulty. clear to auscultation bilaterally. Cardiovascular regular rate and rhythm with normal S1, S2. Psychiatric this patient is able to make  decisions and demonstrates good insight into disease process. Alert and Oriented x 3. pleasant and cooperative. Notes Upon inspection today patient's wound bed actually shows evidence of infection unfortunately. She is having pain as well which in the past she's not even really experienced as much pain I think this is likely due to the fact that she is having an active infection. I think this is something that we need to address as soon as possible. Electronic Signature(s) Signed: 12/21/2018 5:19:05 PM By: Worthy Keeler PA-C Entered By: Worthy Keeler on 12/21/2018 17:11:41 [name] ([id number]) -------------------------------------------------------------------------------- Physician Orders Details Patient Name: [name] Date of Service: 12/21/2018 10:45 AM Medical Record Number: [id number] Patient Account Number: [id number] Date of Birth/Sex: 1976-08-15 (43 y.o. 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 Anesthetic (add to Medication List) Wound #5 Left Toe Great o Topical Lidocaine 4% cream applied to wound bed prior to  debridement (In Clinic Only). Primary Wound Dressing Wound #5 Left Toe Great o Silver Alginate - Gentamicin on wound bed Secondary Dressing Wound #5 Left Toe Great o Conform/Kerlix - foam Dressing Change Frequency Wound #5 Left Toe Great o Three times weekly Follow-up Appointments Wound #5 Left Toe Great o Return Appointment in 1 week. Off-Loading Wound #5 Left Toe Great o Open toe  surgical shoe to: Medications-please add to medication list. Wound #5 Left Toe Great o P.O. Antibiotics - Finish all antibiotics o Topical Antibiotic Laboratory PRAGYA, LOFASO. ([id number]) o Bacteria identified in Wound by Culture (MICRO) oooo LOINC Code: 5456-2 BWLS Convenience Name: Wound culture routine Patient Medications Allergies: erythromycin base Notifications Medication Indication Start End Keflex 12/21/2018 DOSE 1 - oral 500 mg capsule - 1 capsule oral taken every 6 hours (4 times [name] day) for 15 days Electronic Signature(s) Signed: 12/30/2018 11:30:43 PM By: Worthy Keeler PA-C Signed: 12/31/2018 10:44:46 AM By: Montey Hora Previous Signature: 12/21/2018 12:04:50 PM Version By: Worthy Keeler PA-C Entered By: Montey Hora on 12/28/2018 08:22:37 [name] ([id number]) -------------------------------------------------------------------------------- Problem List Details Patient Name: [name] Date of Service: 12/21/2018 10:45 AM Medical Record Number: [id number] Patient Account Number: [id number] Date of Birth/Sex: May 17, 1976 (43 y.o. 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  ([id number]) -------------------------------------------------------------------------------- Progress Note Details Patient Name: [name] Date of Service: 12/21/2018 10:45 AM Medical Record Number: [id number] Patient Account Number: [id number] Date of Birth/Sex: 04/23/1976 (43 y.o. F) Treating RN: Cornell Barman Primary Care Provider: Priscille Kluver Other Clinician: Referring Provider: Priscille Kluver Treating Provider/Extender: Melburn Hake, HOYT Weeks in Treatment: 19 Subjective Chief Complaint Information obtained from Patient Left great toe ulcer History of Present Illness (HPI) 01/18/18-She is here for initial evaluation of the left great toe ulcer. She is [name] poor historian in regards to timeframe in detail. She states approximately 4 weeks ago she lacerated her toe on something in the house. She followed up with her primary care who placed her on Bactrim and ultimately [name] second dose of Bactrim prior to coming to wound clinic. She states she has been treating the toe with peroxide, Betadine and [name] Band-Aid. She did not check her blood sugar this morning but checked it yesterday morning it was 327; she is unaware of [name] recent A1c and there are no current records. She saw Dr. she would've orthopedics last week for an old injury to the left ankle, she states he did not see her toe, nor did she bring it to his attention. She smokes approximately 1 pack cigarettes [name] day. Her social situation is concerning, she arrives this morning with her mother who appears extremely intoxicated/under the influence; her mother was asked to leave the room and be monitored by the patient's grandmother. The patient's aunt then accompanied the patient and the room throughout the rest of the appointment. We had [name] lengthy discussion  regarding the deleterious effects of uncontrolled hyperglycemia and smoking as it relates to wound healing and overall health. She was strongly encouraged to decrease her smoking and get her  diabetes under better control. She states she is currently on [name] diet and has cut down her [facility] consumption. The left toe is erythematous, macerated and slightly edematous with malodor present. The edema in her left foot is below her baseline, there is no erythema streaking. We will treat her with Santyl, doxycycline; we have ordered and xray, culture and provided [name] Peg assist surgical shoe and cultured the wound. 01/25/18-She is here in follow-up evaluation for [name] left great toe ulcer and presents with an abscess to her suprapubic area. She states her blood sugars remain elevated, feeling "sick" and if levels are below 250, but she is trying. She has made no attempt to decrease her smoking stating that we "can't take away her food in her cigarettes". She has been compliant with offloading using the PEG assist you. She is using Santyl daily. the culture obtained last week grew staph aureus and Enterococcus faecalis; continues on the doxycycline and Augmentin was added on Monday. The suprapubic area has erythema, no femoral variation, purple discoloration, minimal induration, was accessed with [name] cotton tip applicator with sanguinopurulent drainage, this was cultured, I suspect the current antibiotic treatment will cover and we will not add anything to her current treatment plan. She was advised to go to urgent care or ER with any change in redness, induration or fever. 02/01/18-She is here in follow-up evaluation for left great toe ulcers and [name] new abdominal abscess from last week. She was able to use packing until earlier this week, where she "forgot it was there". She states she was feeling ill with GI symptoms last week and was not taking her antibiotic. She states her glucose levels have been predominantly less than 200, with occasional levels between 200-250. She thinks this was contributing to her GI symptoms as they have resolved without intervention. There continues to be significant  laceration to left toe, otherwise it clinically looks stable/improved. There is now less superficial opening to the lateral aspect of the great toe that was residual blister. We will transition to [facility] to all wounds, she will continue her Augmentin. If there is no change or deterioration next week for reculture. 02/08/18-She is here in follow-up evaluation for left great toe ulcer and abdominal ulcer. There is an improvement in both wounds. She has been wrapping her left toe with coban, not by our direction, which has created an area of discoloration to the medial aspect; she has been advised to NOT use coban secondary to her neuropathy. She states her glucose levels have been high over this last week ranging from 200-350, she continues to smoke. She admits to being less compliant with her offloading shoe. We will continue with same treatment plan and she will follow-up next week. 02/15/18-She is here in follow-up evaluation for left great toe ulcer and abdominal ulcer. The abdominal ulcer is epithelialized. The left great toe ulcer is improved and all injury from last week using the Coban wrap is resolved, the lateral ulcer is healed. She admits to noncompliance with wearing offloading shoe and admits to glucose levels being greater than 300 most of the week. She continues to smoke and expresses no desire to quit. There is one area medially that probes deeper than it has historically, erythema to the toe and dorsal foot has consistently waxed and waned. There is  no overt signs of cellulitis or infection but we will culture the wound for any occult infection given the new area of depth and erythema. We will hold off on Catar[place], Kesia [name]. ([id number]) sensitivities for initiation of antibiotic therapy. 02/22/18-She is here in follow up evaluation for left great toe ulcer. There is overall significant improvement in both wound appearance, erythema and edema with changes made last week. She was not  initiated on antibiotic therapy. Culture obtained last week showed oxacillin sensitive staph aureus, sensitive to clindamycin. Clindamycin has been called into the pharmacy but she has been instructed to hold off on initiation secondary to overall clinical improvement and her history of antibiotic intolerance. She has been instructed to contact the clinic with any noted changes/deterioration and the wound, erythema, edema and/or pain. She will follow-up next week. She continues to smoke and her glucose levels remain elevated >250; she admits to compliance with offloading shoe 03/01/18 on evaluation today patient appears to be doing fairly well in regard to her left first toe ulcer. She has been tolerating the dressing changes with the [facility] Dressing without complication and overall this has definitely showed signs of improvement according to records as well is what the patient tells me today. I'm very pleased in that regard. She is having no pain today 03/08/18 She is here for follow up evaluation of [name] left great toe ulcer. She remains non-compliant with glucose control and smoking cessation; glucose levels consistently >200. She states that she got new shoe inserts/peg assist. She admits to compliance with offloading. Since my last evaluation there is significant improvement. We will switch to prisma at this time and she will follow up next week. She is noted to be tachycardic at this appointment, heart rate 120s; she has [name] history of heart rate 70-130 according to our records. She admits to extreme agitation r/t personal issues; she was advised to monitor her heartrate and contact her physician if it does not return to [name] more normal range (<100). She takes cardizem twice daily. 03/15/18-She is here in follow-up evaluation for left great toe ulcer. She remains noncompliant with glucose control and smoking cessation. She admits to compliance with wearing offloading shoe. The ulcer is  improved/stable and we will continue with the same treatment plan and she will follow-up next week 03/22/18-She is here for evaluation for left great toe ulcer. There continues to be significant improvement despite recurrent hyperglycemia (over 500 yesterday) and she continues to smoke. She has been compliant with offloading and we will continue with same treatment plan and she will follow-up next week. 03/29/18-She is here for evaluation for left great toe ulcer. Despite continuing to smoke and uncontrolled diabetes she continues to improve. She is compliant with offloading shoe. We will continue with the same treatment plan and she will follow-up next week 04/05/18- She is here in follow up evaluation for [name] left great toe ulcer; she presents with small pustule to left fifth toe (resembles ant bite). She admits to compliance with wearing offloading shoe; continues to smoke or have uncontrolled blood glucose control. There is more callus than usual with evidence of bleeding; she denies known trauma. 04/12/18-She is here for evaluation of left great toe ulcer. Despite noncompliance with glycemic control and smoking she continues to make improvement. She continues to wear offloading shoe. The pustule, that was identified last week, to the left fifth toe is resolved. She will follow-up in 2 weeks 05/03/18-she is seen in follow-up evaluation for [name] left great  toe ulcer. She is compliant with offloading, otherwise noncompliant with glycemic control and smoking. She has plateaued and there is minimal improvement noted. We will transition to [facility], replaced the insert to her surgical shoe and she will follow-up in one week 05/10/18- She is here in follow up evaluation for [name] left great toe ulcer. It appears stable despite measurement change. We will continue with same treatment plan and follow up next week. 05/24/18-She is seen in follow-up evaluation for [name] left great toe ulcer. She remains compliant with  offloading, has made significant improvement in her diet, decreasing the amount of sugar/soda. She said her recent A1c was 10.9 which is lower than. She did see [name] diabetic nutritionist/educator yesterday. She continues to smoke. We will continue with the same treatment plan and she'll follow-up next week. 05/31/18- She is seen in follow-up evaluation for left great toe ulcer. She continues to remain compliant with offloading, continues to make improvement in her diet, increasing her water and decreasing the amount of sugar/soda. She does continue to smoke with no desire to quit. We will apply Prisma to the depth and Hydrofera Blue over. We have not received insurance authorization for oasis. She will follow up next week. 06/07/18-She is seen in follow-up evaluation for left great toe ulcer. It has stalled according to today's measurements although base appears stable. She says she saw [name] diabetic educator yesterday; her average blood sugars are less than 300 which is an improvement for her. She continues to smoke and states "that's my next step" She continues with water over soda. We will order for xray, culture and reinstate ace wrap compression prior to placing apligraf for next week. She is voicing no complaints or concerns. Her dressing will change to iodoflex over the next week in preparation for apligraf. 06/14/18-She is seen in follow-up evaluation for left great toe ulcer. Plain film x-ray performed last week was negative for osteomyelitis. Wound culture obtained last week grew strep B and OSSA; she is initiated on keflex and cefdinir today; there is erythema to the toe which could be from ace wrap compression, she has [name] history of wrapping too tight and has has been encouraged to maintain ace wraps that we place today. We will hold off on application of apligraf today, will apply next week after antibiotic therapy has been initiated. She admits today that she has resumed taking [name] shower with her  foot/toe submerged in water, she has been reminded to keep foot/toe out of the bath water. She will be seen in follow up next week 06/21/18-she is seen in follow-up evaluation for left great toe ulcer. She is tolerating antibiotic therapy with no GI disturbance. The wound is stable. Apligraf was applied today. She has been decreasing her smoking, only had 4 cigarettes yesterday and [place], Raniyah [name]. ([id number]) 1 today. She continues being more compliant in diabetic diet. She will follow-up next week for evaluation of site, if stable will remove at 2 weeks. 06/28/18- She is here in follow up evalution. Apligraf was placed last week, she states the dressing fell off on Tuesday and she was dressing with hydrofera blue. She is healed and will be discharged from the clinic today. She has been instructed to continue with smoking cessation, continue monitoring glucose levels, offloading for an additional 4 weeks and continue with hydrofera blue for additional two weeks for any possible microscopic opening. Readmission: 08/07/18 on evaluation today patient presents for reevaluation concerning the ulcer of her right great toe. She was  previously discharged on 06/28/18 healed. Nonetheless she states that this began to show signs of drainage she subsequently went to her primary care provider. Subsequently an x-ray was performed on 08/01/18 which was negative. The patient was also placed on antibiotics at that time. Fortunately they should have been effective for the infection. Nonetheless she's been experiencing some improvement but still has [name] lot of drainage coming from the wound itself. 08/14/18 on evaluation today patient's wound actually does show signs of improvement in regard to the erythema at this point. She has completed the antibiotics. With that being said we did discuss the possibility of placing her in [name] total contact cast as of today although I think that I may want to give this just [name] little bit  more time to ensure nothing recurrence as far as her infection is concerned. I do not want to put in the cast and risk infection at that time if things are not completely resolved. With that being said she is gonna require some debridement today. 08/21/18 on evaluation today patient actually appears to be doing okay in regard to her toe ulcer. She's been tolerating the dressing changes without complication. With that being said it does appear that she is ready and in fact I think it's appropriate for [place] to go ahead and initiate the total contact cast today. Nonetheless she will require some sharp debridement to prepare the wound for application. Overall I feel like things have been progressing well but we do need to do something to get this to close more readily. 08/24/18 patient seen today for reevaluation after having had the total contact cast applied on Tuesday. She seems to have done very well the wound appears to be doing great and overall I'm pleased with the progress that she's made. There were no abnormal areas of rubbing from the cast on her lower extremity. 08/30/18 on evaluation today patient actually appears to be completely healed in regard to her plantar toe ulcer. She tells me at this point she's been having [name] lot of issues with the cast. She almost fell [name] couple of times the state shall the step of her dog [name] couple times as well. This is been [name] very frustrating process for her other nonetheless she has completely healed the wound which is excellent news. Overall there does not appear to be the evidence of infection at this time which is great news. 09/11/18 evaluation today patient presents for follow-up concerning her great toe ulcer on the left which has unfortunately reopened since I last saw her which was only [name] couple of weeks ago. Unfortunately she was not able to get in to get the shoe and potentially the AFO that's gonna be necessary due to her left foot drop. She continues  with offloading shoe but this is not enough to prevent her from reopening it appears. When we last had her in the total contact cast she did well from [name] healing standpoint but unfortunately the wound reopened as soon as she came out of the cast within just [name] couple of weeks. 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 This is also part of the reason why she falls on [name] regular basis. I just do not believe that is gonna be safe for [place] to  reinitiate the total contact cast as last time we had this on she fell 3 times one week which is definitely not normal for her. 09/18/18 upon evaluation today the patient actually appears to be doing about the same in regard to her toe ulcer. She did not contact [employer] as I asked her to even though I had given her the prescription. In fact she actually states that she has no idea where the prescription is. She did apparently call [facility] and they told her that all she needed to do was bring the prescription in order to be able to be seen and work on getting the AFO for her left foot. With all that being said she still does not have an appointment and I'm not sure were things stand that regard. I will give her [name] new prescription today in order to contact them to get this set up. 09/25/18 on evaluation today patient actually appears to be doing about the same in regard to her toes ulcer. She does have [name] small areas which seems to have [name] lot of callous buildup around the edge of the wound which is going to need sharp debridement today. She still is waiting to be scheduled for evaluation with [facility] for possibility of an AFO. She states there supposed to call her tomorrow to get this set up. Unfortunately it does appear that her foot specifically the toe area is showing signs  of erythema. There does not appear to be any systemic infection which is in these good news. NICOLAS, SISLER [name]. ([id number]) 10/02/18 on evaluation today patient actually appears to be doing about the same in regard to her toe ulcer. This really has not done too well although it's not significantly larger it's also not significantly smaller. She has been tolerating the dressing changes without complication. She actually has her appointment with [facility] and [facility] tomorrow to hopefully be measured for obtaining and AFO splint. I think this would be helpful preventing this from reoccurring. We had contemplated starting the cast this week although to be honest I am reluctant to do that as she's been having nausea, vomiting, and seizure activity over the past three days. She has [name] history of seizures and have been told is nothing that can be done for these. With that being said I do believe that along with the seizures have the nausea vomiting which upon further questioning doesn't seem to be the normal for her and makes me concerned for the possibility of infection or something else going on. I discussed this with the patient and her mother during the office visit today. I do not feel the wound is effective but maybe something else. The responses this was "this just happens to her at times and we don't know why". They did not seem to be interested in going to the hospital to have this checked out further. 10/09/18 on evaluation today patient presents for follow-up concerning her ongoing toe ulcer. She has been tolerating the dressing changes without complication. Fortunately there does not appear to be any evidence of infection which is great news however I do think that the patient would benefit from going ahead for with the total contact cast. She's actually in [name] wheelchair today she tells me that she will use her walker if we initiate the cast. I was very specific about the fact that if we were  gonna do the cast I wanted to make sure that she was using the walker in order to prevent any falls.  She tells me she does not have stairs that she has to traverse on [name] regular basis at her home. She has not had any seizures since last week again that something that happens to her often she tells me she did talk to [employer] and they said that it may take up to three weeks to get the brace approved for her. Hopefully that will not take that long but nonetheless in the meantime I do think the cast could be of benefit. 10/12/18 on evaluation today patient appears to be doing rather well in regard to her toe ulcer. It's just been [name] few days and already this is significantly improved both as far as overall appearance and size. Fortunately there's no sign of infection. She is here for her first obligatory cast change. 10/19/18 Seen today for follow up and management of left great toe ulcer. Wound continues to show improvement. Noted small open area with seroussang drainage with palpation. Denies any increased pain or recent fevers during visit. She will continue calcium alginate with offloading shoe. Denies any questions or concerns during visit. 10/26/18 on evaluation today patient appears to be doing about the same as when I last saw her in regard to her wound bed. Fortunately there does not appear to be any signs of infection. Unfortunately she continues to have [name] breakdown in regard to the toe region any time that she is not in the cast. It takes almost no time at all for this to happen. Nonetheless she still has not heard anything from the brace being made by [facility] as to when exactly this will be available to her. Fortunately there is no signs of infection at this time. 10/30/18 on evaluation today patient presents for application of the total contact cast as we just received him this morning. Fortunately we are gonna be able to apply this to her today which is great news. She continues to have no  significant pain which is good news. Overall I do feel like things have been improving while she was the cast is when she doesn't have [name] cast that things get worse. She still has not really heard anything from [facility] regarding her brace. 11/02/18 upon evaluation today patient's wound already appears to be doing significantly better which is good news. Fortunately there does not appear to be any signs of infection also good news. Overall I do think the total contact cast as before is helping to heal this area unfortunately it's just not gonna likely keep the area closed and healed without her getting her brace at least. Again the foot drop is [name] significant issue for her. 11/09/18 on evaluation today patient appears to be doing excellent in regard to her toe ulcer which in fact is completely healed. Fortunately we finally got the situation squared away with the paperwork which was needed to proceed with getting her brace approved by Medicaid. I have filled that out unfortunately that information has been sent to the orthopedic office that I worked at 2 1/2 years ago and not tired Current wound care measures. Fortunately she seems to be doing very well at this time. 11/23/18 on evaluation today patient appears to be doing More Poorly Compared to Last Time I Saw Her. At [facility] She Had Completely Healed. Currently she is continuing to have issues with reopening. She states that she just found out that the brace was approved through Medicaid now she just has to go get measured in order to have this fitted for her  and then made. Subsequently she does not have an appointment for this yet that is going to complicate things we obviously cannot put her back in the cast if we do not have everything measured because they're not gonna be able to measure her foot while she is in the cast. Unfortunately the other thing that I found out today as well is that she was in the hospital over the weekend due to having [name]  heroin overdose. Obviously this is unfortunate and does have me somewhat worried as well. 11/30/18 on evaluation today patient's toe ulcer actually appears to be doing fairly well. The good news is she will be getting her Paolini, Shadana [name]. ([id number]) brace in the shoes next week on Wednesday. Hopefully we will be able to get this to heal without having to go back in the cast however she may need the cast in order to get the wound completely heal and then go from there. Fortunately there's no signs of infection at this time. 12/07/18 on evaluation today patient fortunately did receive her brace and she states she could tell this definitely makes her walk better. With that being said she's been having issues with her toe where she noticed yesterday there was [name] lot of tissue that was loosing off this appears to be much larger than what it was previous. She also states that her leg has been read putting much across the top of her foot just about the ankle although this seems to be receiving somewhat. The total area is still red and appears to be someone infected as best I can tell. She is previously taken Bactrim and that may be [name] good option for her today as well. We are gonna see what I wound culture shows as well and I think that this is definitely appropriate. With that being said outside of the culture I still need to initiate something in the interim and that's what I'm gonna go ahead and select Bactrim is [name] good option for her. 12/14/18 on evaluation today patient appears to be doing better in regard to her left great toe ulcer as compared to last week's evaluation. There's still some erythema although this is significantly improved which is excellent news. Overall I do believe that she is making good progress is still gonna take some time before she is where I would like her to be from the standpoint of being able to place her back into the total contact cast. Hopefully we will be where we need to  be by next week. 12/21/18 on evaluation today patient actually appears to be doing poorly in regard to her toe ulcer. She's been tolerating the dressing changes without complication. Fortunately there's no signs of systemic infection although she does have [name] lot of drainage from the toe ulcer and this does seem to be causing some issues at this point. She does have erythema on the distal portion of her toe that appears to be likely cellulitis. Patient History Information obtained from Patient. Social History Current every day smoker. Medical History Eyes Patient has history of Cataracts, Glaucoma, Optic Neuritis Ear/Nose/Mouth/Throat Patient has history of Chronic sinus problems/congestion, Middle ear problems Endocrine Patient has history of Type II Diabetes Review of Systems (ROS) Constitutional Symptoms (Grants) Denies complaints or symptoms of Fever, Chills. Respiratory The patient has no complaints or symptoms. Cardiovascular The patient has no complaints or symptoms. Psychiatric The patient has no complaints or symptoms. Objective Constitutional Govea, Mirella [name]. ([id number]) Well-nourished and well-hydrated in no  acute distress. Vitals Time Taken: 10:53 AM, Height: 69 in, Weight: 240 lbs, BMI: 35.4, Temperature: 98.3 F, Pulse: 90 bpm, Respiratory Rate: 16 breaths/min, Blood Pressure: 109/73 mmHg. Respiratory normal breathing without difficulty. clear to auscultation bilaterally. Cardiovascular regular rate and rhythm with normal S1, S2. Psychiatric this patient is able to make decisions and demonstrates good insight into disease process. Alert and Oriented x 3. pleasant and cooperative. General Notes: Upon inspection today patient's wound bed actually shows evidence of infection unfortunately. She is having pain as well which in the past she's not even really experienced as much pain I think this is likely due to the fact that she is having an active infection.  I think this is something that we need to address as soon as possible. Integumentary (Hair, Skin) Wound #5 status is Open. Original cause of wound was Gradually Appeared. The wound is located on the Left Toe Great. The wound measures 2.5cm length x 1.5cm width x 0.3cm depth; 2.945cm^2 area and 0.884cm^3 volume. There is Fat Layer (Subcutaneous Tissue) Exposed exposed. There is no tunneling or undermining noted. There is [name] medium amount of serous drainage noted. The wound margin is epibole. There is no granulation within the wound bed. There is [name] small (1-33%) amount of necrotic tissue within the wound bed including Adherent Slough. The periwound skin appearance exhibited: Callus, Maceration, Erythema. The periwound skin appearance did not exhibit: Crepitus, Excoriation, Induration, Rash, Scarring, Dry/Scaly, Atrophie Blanche, Cyanosis, Ecchymosis, Hemosiderin Staining, Mottled, Pallor, Rubor. The surrounding wound skin color is noted with erythema which is circumferential. Periwound temperature was noted as No Abnormality. 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 Anesthetic (add to Medication List): Wound #5 Left Toe Great: Topical Lidocaine 4% cream applied to wound bed prior to debridement (In Clinic Only). Primary Wound Dressing: Wound #5 Left Toe GreatJULLIE, ARPS [name]. ([id number]) Silver Alginate - Gentamicin on wound bed Secondary Dressing: Wound #5 Left Toe Great: Conform/Kerlix - foam Dressing Change Frequency: Wound #5 Left Toe Great: Three times weekly Follow-up Appointments: Wound #5 Left Toe Great: Return Appointment in 1 week. Off-Loading: Wound #5 Left Toe Great: Open toe surgical shoe to: Medications-please add to medication list.: Wound #5 Left Toe  Great: P.O. Antibiotics - Finish all antibiotics Topical Antibiotic The following medication(s) was prescribed: Keflex oral 500 mg capsule 1 1 capsule oral taken every 6 hours (4 times [name] day) for 15 days starting 12/21/2018 At this point the patient has not been taking and the anabiotic's she is currently done with what we had previously placed her on. For that reason I wanted to put her on linezolid but this interacts with amitriptyline and that is not going to be possible. For that reason I did place her on Keflex currently. I'm gonna see were things stand next week. Again I cannot put in the total contact cast until we get the infection completely cleared and she understands. She was seen with her mom in the office today as well. Subsequently we will see were she is in one weeks time. Please see above for specific wound care orders. We will see patient for re-evaluation in 1 week(s) here in the clinic. If anything worsens or changes patient will contact our office for additional recommendations. Electronic Signature(s) Signed: 12/21/2018 5:19:05 PM By: Worthy Keeler  PA-C Entered By: Worthy Keeler on 12/21/2018 17:12:44 [name] ([id number]) -------------------------------------------------------------------------------- ROS/PFSH Details Patient Name: [name] Date of Service: 12/21/2018 10:45 AM Medical Record Number: [id number] Patient Account Number: [id number] Date of Birth/Sex: 07-Jun-1976 (43 y.o. F) Treating RN: Cornell Barman Primary Care Provider: Priscille Kluver Other Clinician: Referring Provider: Priscille Kluver Treating Provider/Extender: Melburn Hake, HOYT Weeks in Treatment: 55 Information Obtained From Patient Wound History Do you currently have one or more open woundso Yes How many open wounds do you currently haveo 1 Constitutional Symptoms (General Health) Complaints and Symptoms: Negative for: Fever; Chills Eyes Medical History: Positive for: Cataracts;  Glaucoma; Optic Neuritis Ear/Nose/Mouth/Throat Medical History: Positive for: Chronic sinus problems/congestion; Middle ear problems Respiratory Complaints and Symptoms: No Complaints or Symptoms Cardiovascular Complaints and Symptoms: No Complaints or Symptoms Endocrine Medical History: Positive for: Type II Diabetes Time with diabetes: 10 years Treated with: Insulin, Oral agents Blood sugar tested every day: No Blood sugar testing results: Lunch: 350 Psychiatric Complaints and Symptoms: No Complaints or Symptoms HBO Extended History Items Kildow, Jadin [name]. ([id number]) Eyes: Eyes: Ear/Nose/Mouth/Throat: Ear/Nose/Mouth/Throat: Cataracts Glaucoma Chronic sinus Middle ear problems problems/congestion Immunizations Pneumococcal Vaccine: Received Pneumococcal Vaccination: No Implantable Devices No devices added Family and Social History Current every day smoker Physician Affirmation I have reviewed and agree with the above information. Electronic Signature(s) Signed: 12/21/2018 5:19:05 PM By: Worthy Keeler PA-C Signed: 12/24/2018 4:00:49 PM By: Gretta Cool BSN, RN, CWS, [name] RN, BSN Entered By: Worthy Keeler on 12/21/2018 17:11:17 [name] ([id number]) -------------------------------------------------------------------------------- SuperBill Details Patient Name: [name] Date of Service: 12/21/2018 Medical Record Number: [id number] Patient Account Number: [id number] Date of Birth/Sex: [birth year] (43 y.o. F) Treating RN: Cornell Barman Primary Care Provider: Priscille Kluver Other Clinician: Referring Provider: Priscille Kluver Treating Provider/Extender: Melburn Hake, HOYT Weeks in Treatment: 19 Diagnosis Coding ICD-10 Codes Code Description E11.621 Type 2 diabetes mellitus with foot ulcer L97.522 Non-pressure chronic ulcer of other part of left foot with fat layer exposed M21.372 Foot drop, left foot Facility Procedures CPT4 Code: [id number] Description: 99213 -  WOUND CARE VISIT-LEV 3 EST PT Modifier: Quantity: 1 Physician Procedures CPT4 Code: [id number] Description: 38177 - WC PHYS LEVEL 4 - EST PT ICD-10 Diagnosis Description E11.621 Type 2 diabetes mellitus with foot ulcer L97.522 Non-pressure chronic ulcer of other part of left foot with fa M21.372 Foot drop, left foot Modifier: t layer exposed Quantity: 1 Electronic Signature(s) Signed: 12/21/2018 5:19:05 PM By: Worthy Keeler PA-C Entered By: Worthy Keeler on 12/21/2018 17:12:55

## 2018-12-26 LAB — AEROBIC/ANAEROBIC CULTURE W GRAM STAIN (SURGICAL/DEEP WOUND): Gram Stain: NONE SEEN

## 2018-12-28 ENCOUNTER — Encounter: Payer: Medicaid Other | Attending: Physician Assistant | Admitting: Physician Assistant

## 2018-12-28 DIAGNOSIS — Z880 Allergy status to penicillin: Secondary | ICD-10-CM | POA: Diagnosis not present

## 2018-12-28 DIAGNOSIS — E1139 Type 2 diabetes mellitus with other diabetic ophthalmic complication: Secondary | ICD-10-CM | POA: Insufficient documentation

## 2018-12-28 DIAGNOSIS — E1136 Type 2 diabetes mellitus with diabetic cataract: Secondary | ICD-10-CM | POA: Insufficient documentation

## 2018-12-28 DIAGNOSIS — R569 Unspecified convulsions: Secondary | ICD-10-CM | POA: Diagnosis not present

## 2018-12-28 DIAGNOSIS — E11621 Type 2 diabetes mellitus with foot ulcer: Secondary | ICD-10-CM | POA: Insufficient documentation

## 2018-12-28 DIAGNOSIS — Z794 Long term (current) use of insulin: Secondary | ICD-10-CM | POA: Insufficient documentation

## 2018-12-28 DIAGNOSIS — Z881 Allergy status to other antibiotic agents status: Secondary | ICD-10-CM | POA: Insufficient documentation

## 2018-12-28 DIAGNOSIS — M21372 Foot drop, left foot: Secondary | ICD-10-CM | POA: Insufficient documentation

## 2018-12-28 DIAGNOSIS — E1141 Type 2 diabetes mellitus with diabetic mononeuropathy: Secondary | ICD-10-CM | POA: Diagnosis not present

## 2018-12-28 DIAGNOSIS — L97522 Non-pressure chronic ulcer of other part of left foot with fat layer exposed: Secondary | ICD-10-CM | POA: Diagnosis not present

## 2018-12-28 DIAGNOSIS — H42 Glaucoma in diseases classified elsewhere: Secondary | ICD-10-CM | POA: Insufficient documentation

## 2018-12-28 DIAGNOSIS — F17210 Nicotine dependence, cigarettes, uncomplicated: Secondary | ICD-10-CM | POA: Diagnosis not present

## 2018-12-30 NOTE — Progress Notes (Signed)
ITA, FRITZSCHE ([id number]) Visit Report for 12/28/2018 Arrival Information Details Patient Name: [name], [name] Date of Service: 12/28/2018 11:00 AM Medical Record Number: [id number] Patient Account Number: [id number] Date of Birth/Sex: 1976/09/29 (43 y.o. F) Treating RN: Montey Hora Primary Care Tykee Heideman: Priscille Kluver Other Clinician: Referring Kaytlynn Kochan: Priscille Kluver Treating Jaid Quirion/Extender: Melburn Hake, HOYT Weeks in Treatment: 20 Visit Information History Since Last Visit Added or deleted any medications: No Patient Arrived: Wheel Chair Any new allergies or adverse reactions: No Arrival Time: 10:58 Had a fall or experienced change in No activities of daily living that may affect Accompanied By: mother risk of falls: Transfer Assistance: None Signs or symptoms of abuse/neglect since last visito No Patient Identification Verified: Yes Hospitalized since last visit: No Secondary Verification Process Completed: Yes Implantable device outside of the clinic excluding No Patient Requires Transmission-Based No cellular tissue based products placed in the center Precautions: since last visit: Patient Has Alerts: No Has Dressing in Place as Prescribed: Yes Pain Present Now: Yes Electronic Signature(s) Signed: 12/28/2018 3:08:28 PM By: Lorine Bears RCP, RRT, CHT Entered By: Lorine Bears on 12/28/2018 10:59:02 [name] ([id number]) -------------------------------------------------------------------------------- Encounter Discharge Information Details Patient Name: Heather Dear A. Date of Service: 12/28/2018 11:00 AM Medical Record Number: [id number] Patient Account Number: [id number] Date of Birth/Sex: 11-04-1975 (43 y.o. F) Treating RN: Cornell Barman Primary Care Shadoe Bethel: Priscille Kluver Other Clinician: Referring [name] [name]: Priscille Kluver Treating Deb Loudin/Extender: Melburn Hake, HOYT Weeks in Treatment: 20 Encounter Discharge Information Items  Post Procedure Vitals Discharge Condition: Stable Temperature (F): 98.2 Ambulatory Status: Wheelchair Pulse (bpm): 81 Discharge Destination: Home Respiratory Rate (breaths/min): 16 Transportation: Private Auto Blood Pressure (mmHg): 105/73 Accompanied By: self Schedule Follow-up Appointment: Yes Clinical Summary of Care: Electronic Signature(s) Signed: 12/28/2018 4:58:22 PM By: Gretta Cool, BSN, RN, CWS, [name] RN, BSN Entered By: Gretta Cool, BSN, RN, CWS, [name] on 12/28/2018 11:46:31 [name] ([id number]) -------------------------------------------------------------------------------- Lower Extremity Assessment Details Patient Name: Heather Dear A. Date of Service: 12/28/2018 11:00 AM Medical Record Number: [id number] Patient Account Number: [id number] Date of Birth/Sex: February 18, 1976 (43 y.o. F) Treating RN: Army Melia Primary Care Amen Staszak: Priscille Kluver Other Clinician: Referring Ishika Chesterfield: Priscille Kluver Treating Naveah Brave/Extender: STONE III, HOYT Weeks in Treatment: 20 Edema Assessment Assessed: [Left: No] [Right: No] Edema: [Left: N] [Right: o] Vascular Assessment Pulses: Dorsalis Pedis Palpable: [Left:Yes] Posterior Tibial Extremity colors, hair growth, and conditions: Extremity Color: [Left:Red] Hair Growth on Extremity: [Left:Yes] Temperature of Extremity: [Left:Warm] Capillary Refill: [Left:< 3 seconds] Toe Nail Assessment Left: Right: Thick: No Discolored: No Deformed: No Improper Length and Hygiene: No Electronic Signature(s) Signed: 12/28/2018 3:17:07 PM By: Army Melia Entered By: Army Melia on 12/28/2018 11:06:47 [name], [name] ([id number]) -------------------------------------------------------------------------------- Multi Wound Chart Details Patient Name: Heather Dear A. Date of Service: 12/28/2018 11:00 AM Medical Record Number: [id number] Patient Account Number: [id number] Date of Birth/Sex: [birth year] (43 y.o. F) Treating RN: Montey Hora Primary  Care Lyvonne Cassell: Priscille Kluver Other Clinician: Referring Carys Malina: Priscille Kluver Treating Rhylee Pucillo/Extender: STONE III, HOYT Weeks in Treatment: 20 Vital Signs Height(in): 69 Pulse(bpm): 81 Weight(lbs): 240 Blood Pressure(mmHg): 105/73 Body Mass Index(BMI): 35 Temperature(F): 98.2 Respiratory Rate 16 (breaths/min): Photos: [N/A:N/A] Wound Location: Left Toe Great N/A N/A Wounding Event: Gradually Appeared N/A N/A Primary Etiology: Diabetic Wound/Ulcer of the N/A N/A Lower Extremity Comorbid History: Cataracts, Glaucoma, Optic N/A N/A Neuritis, Chronic sinus problems/congestion, Middle ear problems, Type II Diabetes Date Acquired: 11/16/2018 N/A N/A Weeks of Treatment: 5 N/A N/A Wound Status: Open N/A N/A Measurements L x W  x D 2x1x0.3 N/A N/A (cm) Area (cm) : 1.571 N/A N/A Volume (cm) : 0.471 N/A N/A % Reduction in Area: -299.70% N/A N/A % Reduction in Volume: -200.00% N/A N/A Classification: Grade 3 N/A N/A Exudate Amount: Medium N/A N/A Exudate Type: Serous N/A N/A Exudate Color: amber N/A N/A Wound Margin: Epibole N/A N/A Granulation Amount: Small (1-33%) N/A N/A Granulation Quality: Pink N/A N/A Necrotic Amount: Medium (34-66%) N/A N/A Exposed Structures: Fat Layer (Subcutaneous N/A N/A Tissue) Exposed: Yes Fascia: No Tendon: No [name], [name] ([id number]) Muscle: No Joint: No Bone: No Epithelialization: None N/A N/A Periwound Skin Texture: Callus: Yes N/A N/A Excoriation: No Induration: No Crepitus: No Rash: No Scarring: No Periwound Skin Moisture: Maceration: Yes N/A N/A Dry/Scaly: No Periwound Skin Color: Erythema: Yes N/A N/A Atrophie Blanche: No Cyanosis: No Ecchymosis: No Hemosiderin Staining: No Mottled: No Pallor: No Rubor: No Erythema Location: Circumferential N/A N/A Temperature: No Abnormality N/A N/A Tenderness on Palpation: No N/A N/A Wound Preparation: Ulcer Cleansing: N/A N/A Rinsed/Irrigated with Saline Topical  Anesthetic Applied: Other: lidocaine 4% Treatment Notes Electronic Signature(s) Signed: 12/28/2018 4:27:08 PM By: Montey Hora Entered By: Montey Hora on 12/28/2018 11:28:17 [name] ([id number]) -------------------------------------------------------------------------------- [place] Details Patient Name: Heather Dear A. Date of Service: 12/28/2018 11:00 AM Medical Record Number: [id number] Patient Account Number: [id number] Date of Birth/Sex: Mar 24, 1976 (43 y.o. F) Treating RN: Montey Hora Primary Care Runa Whittingham: Priscille Kluver Other Clinician: Referring Arien Benincasa: Priscille Kluver Treating Stedman [place]/Extender: Melburn Hake, HOYT Weeks in Treatment: 20 Active Inactive Abuse / Safety / Falls / Self Care Management Nursing Diagnoses: History of Falls Goals: Patient will not experience any injury related to falls Date Initiated: 08/07/2018 Target Resolution Date: 10/19/2018 Goal Status: Active Interventions: Assess fall risk on admission and as needed Notes: Orientation to the Wound Care Program Nursing Diagnoses: Knowledge deficit related to the wound healing center program Goals: Patient/caregiver will verbalize understanding of the [place] Program Date Initiated: 08/07/2018 Target Resolution Date: 10/19/2018 Goal Status: Active Interventions: Provide education on orientation to the wound center Notes: Soft Tissue Infection Nursing Diagnoses: Impaired tissue integrity Goals: Patient will remain free of wound infection Date Initiated: 08/07/2018 Target Resolution Date: 10/19/2018 Goal Status: Active Interventions: Assess signs and symptoms of infection every visit [name], [name] ([id number]) Notes: Wound/Skin Impairment Nursing Diagnoses: Impaired tissue integrity Goals: Ulcer/skin breakdown will heal within 14 weeks Date Initiated: 08/07/2018 Target Resolution Date: 10/19/2018 Goal Status: Active Interventions: Assess  patient/caregiver ability to obtain necessary supplies Notes: Electronic Signature(s) Signed: 12/28/2018 4:27:08 PM By: Montey Hora Entered By: Montey Hora on 12/28/2018 11:28:06 [name] ([id number]) -------------------------------------------------------------------------------- Pain Assessment Details Patient Name: Heather Dear A. Date of Service: 12/28/2018 11:00 AM Medical Record Number: [id number] Patient Account Number: [id number] Date of Birth/Sex: [birth year] (43 y.o. F) Treating RN: Montey Hora Primary Care Orean Giarratano: Priscille Kluver Other Clinician: Referring Virgilia Quigg: Priscille Kluver Treating Devantae Babe/Extender: STONE III, HOYT Weeks in Treatment: 20 Active Problems Location of Pain Severity and Description of Pain Patient Has Paino Yes Site Locations Rate the pain. Current Pain Level: 3 Pain Management and Medication Current Pain Management: Electronic Signature(s) Signed: 12/28/2018 3:08:28 PM By: Lorine Bears RCP, RRT, CHT Signed: 12/28/2018 4:27:08 PM By: Montey Hora Entered By: Lorine Bears on 12/28/2018 10:59:13 [name] ([id number]) -------------------------------------------------------------------------------- Patient/Caregiver Education Details Patient Name: Heather Dear A. Date of Service: 12/28/2018 11:00 AM Medical Record Number: [id number] Patient Account Number: [id number] Date of Birth/Gender: [birth year] (43 y.o. F) Treating RN: Montey Hora Primary  Care Physician: Priscille Kluver Other Clinician: Referring Physician: Priscille Kluver Treating Physician/Extender: Sharalyn Ink in Treatment: 20 Education Assessment Education Provided To: Patient and Caregiver Education Topics Provided Wound/Skin Impairment: Handouts: Other: wound care as ordered Methods: Demonstration, Explain/Verbal Responses: State content correctly Electronic Signature(s) Signed: 12/28/2018 4:27:08 PM By: Montey Hora Entered  By: Montey Hora on 12/28/2018 11:35:44 [name], [name] Kitchen ([id number]) -------------------------------------------------------------------------------- Wound Assessment Details Patient Name: Heather Dear A. Date of Service: 12/28/2018 11:00 AM Medical Record Number: [id number] Patient Account Number: [id number] Date of Birth/Sex: 03-26-1976 (43 y.o. F) Treating RN: Army Melia Primary Care Laron Boorman: Priscille Kluver Other Clinician: Referring Cindy Fullman: Priscille Kluver Treating Areeba Sulser/Extender: STONE III, HOYT Weeks in Treatment: 20 Wound Status Wound Number: 5 Primary Diabetic Wound/Ulcer of the Lower Extremity Etiology: Wound Location: Left Toe Great Wound Open Wounding Event: Gradually Appeared Status: Date Acquired: 11/16/2018 Comorbid Cataracts, Glaucoma, Optic Neuritis, Chronic Weeks Of Treatment: 5 History: sinus problems/congestion, Middle ear problems, Clustered Wound: No Type II Diabetes Photos Wound Measurements Length: (cm) 2 % Reduction i Width: (cm) 1 % Reduction i Depth: (cm) 0.3 Epithelializa Area: (cm) 1.571 Tunneling: Volume: (cm) 0.471 Undermining: n Area: -299.7% n Volume: -200% tion: None No No Wound Description Classification: Grade 3 Foul Odor Af Wound Margin: Epibole Slough/Fibri Exudate Amount: Medium Exudate Type: Serous Exudate Color: amber ter Cleansing: No no No Wound Bed Granulation Amount: Small (1-33%) Exposed Structure Granulation Quality: Pink Fascia Exposed: No Necrotic Amount: Medium (34-66%) Fat Layer (Subcutaneous Tissue) Exposed: Yes Necrotic Quality: Adherent Slough Tendon Exposed: No Muscle Exposed: No Joint Exposed: No Bone Exposed: No Periwound Skin Texture Texture Color [name], [name] ([id number]) No Abnormalities Noted: No No Abnormalities Noted: No Callus: Yes Atrophie Blanche: No Crepitus: No Cyanosis: No Excoriation: No Ecchymosis: No Induration: No Erythema: Yes Rash: No Erythema Location:  Circumferential Scarring: No Hemosiderin Staining: No Mottled: No Moisture Pallor: No No Abnormalities Noted: No Rubor: No Dry / Scaly: No Maceration: Yes Temperature / Pain Temperature: No Abnormality Wound Preparation Ulcer Cleansing: Rinsed/Irrigated with Saline Topical Anesthetic Applied: Other: lidocaine 4%, Treatment Notes Wound #5 (Left Toe Great) Notes gentamicin cream, silvercel, gauze and conform Electronic Signature(s) Signed: 12/28/2018 3:17:07 PM By: Army Melia Entered By: Army Melia on 12/28/2018 11:06:15 [name] ([id number]) -------------------------------------------------------------------------------- Vitals Details Patient Name: Heather Dear A. Date of Service: 12/28/2018 11:00 AM Medical Record Number: [id number] Patient Account Number: [id number] Date of Birth/Sex: 05/09/1976 (43 y.o. F) Treating RN: Montey Hora Primary Care Lucette Kratz: Priscille Kluver Other Clinician: Referring Jess Toney: Priscille Kluver Treating Allysha Tryon/Extender: STONE III, HOYT Weeks in Treatment: 20 Vital Signs Time Taken: 10:59 Temperature (F): 98.2 Height (in): 69 Pulse (bpm): 81 Weight (lbs): 240 Respiratory Rate (breaths/min): 16 Body Mass Index (BMI): 35.4 Blood Pressure (mmHg): 105/73 Reference Range: 80 - 120 mg / dl Electronic Signature(s) Signed: 12/28/2018 3:08:28 PM By: Lorine Bears RCP, RRT, CHT Entered By: Lorine Bears on 12/28/2018 11:02:52

## 2018-12-31 NOTE — Progress Notes (Signed)
[name], [name] ([id number]) Visit Report for 12/28/2018 Chief Complaint Document Details Patient Name: [name], [name] Date of Service: 12/28/2018 11:00 AM Medical Record Number: [id number] Patient Account Number: [id number] Date of Birth/Sex: [birth year] (43 y.o. F) Treating RN: Montey Hora Primary Care Provider: Priscille Kluver Other Clinician: Referring Provider: Priscille Kluver Treating Provider/Extender: Melburn Hake, Saxton Chain Weeks in Treatment: 20 Information Obtained from: Patient Chief Complaint Left great toe ulcer Electronic Signature(s) Signed: 12/29/2018 6:13:30 AM By: Worthy Keeler PA-C Entered By: Worthy Keeler on 12/28/2018 11:27:09 [name] ([id number]) -------------------------------------------------------------------------------- Debridement Details Patient Name: Heather Dear A. Date of Service: 12/28/2018 11:00 AM Medical Record Number: [id number] Patient Account Number: [id number] Date of Birth/Sex: Jul 23, 1976 (43 y.o. F) Treating RN: Montey Hora Primary Care Provider: Priscille Kluver Other Clinician: Referring Provider: Priscille Kluver Treating Provider/Extender: STONE III, Joee Iovine Weeks in Treatment: 20 Debridement Performed for Wound #5 Left Toe Great Assessment: Performed By: Physician STONE III, Cymone Yeske E., PA-C Debridement Type: Debridement Severity of Tissue Pre Fat layer exposed Debridement: Level of Consciousness (Pre- Awake and Alert procedure): Pre-procedure Verification/Time Yes - 11:29 Out Taken: Start Time: 11:29 Pain Control: Lidocaine 4% Topical Solution Total Area Debrided (L x W): 2 (cm) x 1 (cm) = 2 (cm) Tissue and other material Viable, Non-Viable, Callus, Slough, Subcutaneous, Slough debrided: Level: Skin/Subcutaneous Tissue Debridement Description: Excisional Instrument: Curette Bleeding: Minimum Hemostasis Achieved: Pressure End Time: 11:34 Procedural Pain: 0 Post Procedural Pain: 0 Response to Treatment: Procedure was  tolerated well Level of Consciousness Awake and Alert (Post-procedure): Post Debridement Measurements of Total Wound Length: (cm) 2 Width: (cm) 1.7 Depth: (cm) 0.3 Volume: (cm) 0.801 Character of Wound/Ulcer Post Debridement: Improved Severity of Tissue Post Debridement: Fat layer exposed Post Procedure Diagnosis Same as Pre-procedure Electronic Signature(s) Signed: 12/28/2018 4:27:08 PM By: Montey Hora Signed: 12/29/2018 6:13:30 AM By: Worthy Keeler PA-C Entered By: Montey Hora on 12/28/2018 11:34:32 [name], [name] ([id number]) -------------------------------------------------------------------------------- HPI Details Patient Name: Heather Dear A. Date of Service: 12/28/2018 11:00 AM Medical Record Number: [id number] Patient Account Number: [id number] Date of Birth/Sex: Apr 05, 1976 (43 y.o. F) Treating RN: Montey Hora Primary Care Provider: Priscille Kluver Other Clinician: Referring Provider: Priscille Kluver Treating Provider/Extender: Melburn Hake, Sally Menard Weeks in Treatment: 20 History of Present Illness HPI Description: 01/18/18-She is here for initial evaluation of the left great toe ulcer. She is a poor historian in regards to timeframe in detail. She states approximately 4 weeks ago she lacerated her toe on something in the house. She followed up with her primary care who placed her on Bactrim and ultimately a second dose of Bactrim prior to coming to wound clinic. She states she has been treating the toe with peroxide, Betadine and a Band-Aid. She did not check her blood sugar this morning but checked it yesterday morning it was 327; she is unaware of a recent A1c and there are no current records. She saw Dr. she would've orthopedics last week for an old injury to the left ankle, she states he did not see her toe, nor did she bring it to his attention. She smokes approximately 1 pack cigarettes a day. Her social situation is concerning, she arrives this morning with her  mother who appears extremely intoxicated/under the influence; her mother was asked to leave the room and be monitored by the patient's grandmother. The patient's aunt then accompanied the patient and the room throughout the rest of the appointment. We had a lengthy discussion regarding the deleterious effects of uncontrolled hyperglycemia and smoking  as it relates to wound healing and overall health. She was strongly encouraged to decrease her smoking and get her diabetes under better control. She states she is currently on a diet and has cut down her [facility] consumption. The left toe is erythematous, macerated and slightly edematous with malodor present. The edema in her left foot is below her baseline, there is no erythema streaking. We will treat her with Santyl, doxycycline; we have ordered and xray, culture and provided a Peg assist surgical shoe and cultured the wound. 01/25/18-She is here in follow-up evaluation for a left great toe ulcer and presents with an abscess to her suprapubic area. She states her blood sugars remain elevated, feeling "sick" and if levels are below 250, but she is trying. She has made no attempt to decrease her smoking stating that we "can't take away her food in her cigarettes". She has been compliant with offloading using the PEG assist you. She is using Santyl daily. the culture obtained last week grew staph aureus and Enterococcus faecalis; continues on the doxycycline and Augmentin was added on Monday. The suprapubic area has erythema, no femoral variation, purple discoloration, minimal induration, was accessed with a cotton tip applicator with sanguinopurulent drainage, this was cultured, I suspect the current antibiotic treatment will cover and we will not add anything to her current treatment plan. She was advised to go to urgent care or ER with any change in redness, induration or fever. 02/01/18-She is here in follow-up evaluation for left great toe ulcers  and a new abdominal abscess from last week. She was able to use packing until earlier this week, where she "forgot it was there". She states she was feeling ill with GI symptoms last week and was not taking her antibiotic. She states her glucose levels have been predominantly less than 200, with occasional levels between 200-250. She thinks this was contributing to her GI symptoms as they have resolved without intervention. There continues to be significant laceration to left toe, otherwise it clinically looks stable/improved. There is now less superficial opening to the lateral aspect of the great toe that was residual blister. We will transition to [facility] to all wounds, she will continue her Augmentin. If there is no change or deterioration next week for reculture. 02/08/18-She is here in follow-up evaluation for left great toe ulcer and abdominal ulcer. There is an improvement in both wounds. She has been wrapping her left toe with coban, not by our direction, which has created an area of discoloration to the medial aspect; she has been advised to NOT use coban secondary to her neuropathy. She states her glucose levels have been high over this last week ranging from 200-350, she continues to smoke. She admits to being less compliant with her offloading shoe. We will continue with same treatment plan and she will follow-up next week. 02/15/18-She is here in follow-up evaluation for left great toe ulcer and abdominal ulcer. The abdominal ulcer is epithelialized. The left great toe ulcer is improved and all injury from last week using the Coban wrap is resolved, the lateral ulcer is healed. She admits to noncompliance with wearing offloading shoe and admits to glucose levels being greater than 300 most of the week. She continues to smoke and expresses no desire to quit. There is one area medially that probes deeper than it has historically, erythema to the toe and dorsal foot has consistently  waxed and waned. There is no overt signs of cellulitis or infection but we  will culture the wound for any occult infection given the new area of depth and erythema. We will hold off on sensitivities for initiation of antibiotic therapy. 02/22/18-She is here in follow up evaluation for left great toe ulcer. There is overall significant improvement in both wound appearance, erythema and edema with changes made last week. She was not initiated on antibiotic therapy. Culture obtained last week showed oxacillin sensitive staph aureus, sensitive to clindamycin. Clindamycin has been called into the pharmacy but she has been instructed to hold off on initiation secondary to overall clinical improvement and her history of antibiotic intolerance. She has been instructed to contact the clinic with any noted changes/deterioration and the wound, erythema, Ticas, Noreene A. ([id number]) edema and/or pain. She will follow-up next week. She continues to smoke and her glucose levels remain elevated >250; she admits to compliance with offloading shoe 03/01/18 on evaluation today patient appears to be doing fairly well in regard to her left first toe ulcer. She has been tolerating the dressing changes with the [facility] Dressing without complication and overall this has definitely showed signs of improvement according to records as well is what the patient tells me today. I'm very pleased in that regard. She is having no pain today 03/08/18 She is here for follow up evaluation of a left great toe ulcer. She remains non-compliant with glucose control and smoking cessation; glucose levels consistently >200. She states that she got new shoe inserts/peg assist. She admits to compliance with offloading. Since my last evaluation there is significant improvement. We will switch to prisma at this time and she will follow up next week. She is noted to be tachycardic at this appointment, heart rate 120s; she has a history of  heart rate 70-130 according to our records. She admits to extreme agitation r/t personal issues; she was advised to monitor her heartrate and contact her physician if it does not return to a more normal range (<100). She takes cardizem twice daily. 03/15/18-She is here in follow-up evaluation for left great toe ulcer. She remains noncompliant with glucose control and smoking cessation. She admits to compliance with wearing offloading shoe. The ulcer is improved/stable and we will continue with the same treatment plan and she will follow-up next week 03/22/18-She is here for evaluation for left great toe ulcer. There continues to be significant improvement despite recurrent hyperglycemia (over 500 yesterday) and she continues to smoke. She has been compliant with offloading and we will continue with same treatment plan and she will follow-up next week. 03/29/18-She is here for evaluation for left great toe ulcer. Despite continuing to smoke and uncontrolled diabetes she continues to improve. She is compliant with offloading shoe. We will continue with the same treatment plan and she will follow-up next week 04/05/18- She is here in follow up evaluation for a left great toe ulcer; she presents with small pustule to left fifth toe (resembles ant bite). She admits to compliance with wearing offloading shoe; continues to smoke or have uncontrolled blood glucose control. There is more callus than usual with evidence of bleeding; she denies known trauma. 04/12/18-She is here for evaluation of left great toe ulcer. Despite noncompliance with glycemic control and smoking she continues to make improvement. She continues to wear offloading shoe. The pustule, that was identified last week, to the left fifth toe is resolved. She will follow-up in 2 weeks 05/03/18-she is seen in follow-up evaluation for a left great toe ulcer. She is compliant with offloading, otherwise noncompliant with  glycemic control and smoking.  She has plateaued and there is minimal improvement noted. We will transition to [facility], replaced the insert to her surgical shoe and she will follow-up in one week 05/10/18- She is here in follow up evaluation for a left great toe ulcer. It appears stable despite measurement change. We will continue with same treatment plan and follow up next week. 05/24/18-She is seen in follow-up evaluation for a left great toe ulcer. She remains compliant with offloading, has made significant improvement in her diet, decreasing the amount of sugar/soda. She said her recent A1c was 10.9 which is lower than. She did see a diabetic nutritionist/educator yesterday. She continues to smoke. We will continue with the same treatment plan and she'll follow-up next week. 05/31/18- She is seen in follow-up evaluation for left great toe ulcer. She continues to remain compliant with offloading, continues to make improvement in her diet, increasing her water and decreasing the amount of sugar/soda. She does continue to smoke with no desire to quit. We will apply Prisma to the depth and Hydrofera Blue over. We have not received insurance authorization for oasis. She will follow up next week. 06/07/18-She is seen in follow-up evaluation for left great toe ulcer. It has stalled according to today's measurements although base appears stable. She says she saw a diabetic educator yesterday; her average blood sugars are less than 300 which is an improvement for her. She continues to smoke and states "that's my next step" She continues with water over soda. We will order for xray, culture and reinstate ace wrap compression prior to placing apligraf for next week. She is voicing no complaints or concerns. Her dressing will change to iodoflex over the next week in preparation for apligraf. 06/14/18-She is seen in follow-up evaluation for left great toe ulcer. Plain film x-ray performed last week was negative for osteomyelitis. Wound  culture obtained last week grew strep B and OSSA; she is initiated on keflex and cefdinir today; there is erythema to the toe which could be from ace wrap compression, she has a history of wrapping too tight and has has been encouraged to maintain ace wraps that we place today. We will hold off on application of apligraf today, will apply next week after antibiotic therapy has been initiated. She admits today that she has resumed taking a shower with her foot/toe submerged in water, she has been reminded to keep foot/toe out of the bath water. She will be seen in follow up next week 06/21/18-she is seen in follow-up evaluation for left great toe ulcer. She is tolerating antibiotic therapy with no GI disturbance. The wound is stable. Apligraf was applied today. She has been decreasing her smoking, only had 4 cigarettes yesterday and 1 today. She continues being more compliant in diabetic diet. She will follow-up next week for evaluation of site, if stable will remove at 2 weeks. 06/28/18- She is here in follow up evalution. Apligraf was placed last week, she states the dressing fell off on Tuesday and she was dressing with hydrofera blue. She is healed and will be discharged from the clinic today. She has been instructed to continue with smoking cessation, continue monitoring glucose levels, offloading for an additional 4 weeks and continue with [name], [name] ([id number]) hydrofera blue for additional two weeks for any possible microscopic opening. Readmission: 08/07/18 on evaluation today patient presents for reevaluation concerning the ulcer of her right great toe. She was previously discharged on 06/28/18 healed. Nonetheless she states that this  began to show signs of drainage she subsequently went to her primary care provider. Subsequently an x-ray was performed on 08/01/18 which was negative. The patient was also placed on antibiotics at that time. Fortunately they should have been effective for  the infection. Nonetheless she's been experiencing some improvement but still has a lot of drainage coming from the wound itself. 08/14/18 on evaluation today patient's wound actually does show signs of improvement in regard to the erythema at this point. She has completed the antibiotics. With that being said we did discuss the possibility of placing her in a total contact cast as of today although I think that I may want to give this just a little bit more time to ensure nothing recurrence as far as her infection is concerned. I do not want to put in the cast and risk infection at that time if things are not completely resolved. With that being said she is gonna require some debridement today. 08/21/18 on evaluation today patient actually appears to be doing okay in regard to her toe ulcer. She's been tolerating the dressing changes without complication. With that being said it does appear that she is ready and in fact I think it's appropriate for [place] to go ahead and initiate the total contact cast today. Nonetheless she will require some sharp debridement to prepare the wound for application. Overall I feel like things have been progressing well but we do need to do something to get this to close more readily. 08/24/18 patient seen today for reevaluation after having had the total contact cast applied on Tuesday. She seems to have done very well the wound appears to be doing great and overall I'm pleased with the progress that she's made. There were no abnormal areas of rubbing from the cast on her lower extremity. 08/30/18 on evaluation today patient actually appears to be completely healed in regard to her plantar toe ulcer. She tells me at this point she's been having a lot of issues with the cast. She almost fell a couple of times the state shall the step of her dog a couple times as well. This is been a very frustrating process for her other nonetheless she has completely healed the  wound which is excellent news. Overall there does not appear to be the evidence of infection at this time which is great news. 09/11/18 evaluation today patient presents for follow-up concerning her great toe ulcer on the left which has unfortunately reopened since I last saw her which was only a couple of weeks ago. Unfortunately she was not able to get in to get the shoe and potentially the AFO that's gonna be necessary due to her left foot drop. She continues with offloading shoe but this is not enough to prevent her from reopening it appears. When we last had her in the total contact cast she did well from a healing standpoint but unfortunately the wound reopened as soon as she came out of the cast within just a couple of weeks. 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 This is also part of the reason why she falls on a regular basis. I just do not believe that is gonna be safe for [place] to reinitiate the total contact cast as last time we had  this on she fell 3 times one week which is definitely not normal for her. 09/18/18 upon evaluation today the patient actually appears to be doing about the same in regard to her toe ulcer. She did not contact [employer] as I asked her to even though I had given her the prescription. In fact she actually states that she has no idea where the prescription is. She did apparently call [employer] and they told her that all she needed to do was bring the prescription in order to be able to be seen and work on getting the AFO for her left foot. With all that being said she still does not have an appointment and I'm not sure were things stand that regard. I will give her a new prescription today in order to contact them to get this set up. 09/25/18 on evaluation today patient  actually appears to be doing about the same in regard to her toes ulcer. She does have a small areas which seems to have a lot of callous buildup around the edge of the wound which is going to need sharp debridement today. She still is waiting to be scheduled for evaluation with [employer] for possibility of an AFO. She states there supposed to call her tomorrow to get this set up. Unfortunately it does appear that her foot specifically the toe area is showing signs of erythema. There does not appear to be any systemic infection which is in these good news. 10/02/18 on evaluation today patient actually appears to be doing about the same in regard to her toe ulcer. This really has not done too well although it's not significantly larger it's also not significantly smaller. She has been tolerating the dressing changes without complication. She actually has her appointment with [employer] and [facility] tomorrow to hopefully be measured for obtaining and AFO splint. I think this would be helpful preventing this from reoccurring. We had contemplated starting the cast this week although to be honest I am reluctant to do that as she's been having nausea, vomiting, and seizure activity over the past three days. She has a history of seizures and have been told is nothing that can be done for these. With Catanzaro, Anevay A. ([id number]) that being said I do believe that along with the seizures have the nausea vomiting which upon further questioning doesn't seem to be the normal for her and makes me concerned for the possibility of infection or something else going on. I discussed this with the patient and her mother during the office visit today. I do not feel the wound is effective but maybe something else. The responses this was "this just happens to her at times and we don't know why". They did not seem to be interested in going to the hospital to have this checked out further. 10/09/18 on evaluation today patient  presents for follow-up concerning her ongoing toe ulcer. She has been tolerating the dressing changes without complication. Fortunately there does not appear to be any evidence of infection which is great news however I do think that the patient would benefit from going ahead for with the total contact cast. She's actually in a wheelchair today she tells me that she will use her walker if we initiate the cast. I was very specific about the fact that if we were gonna do the cast I wanted to make sure that she was using the walker in order to prevent any falls. She tells me she does not have stairs that she  has to traverse on a regular basis at her home. She has not had any seizures since last week again that something that happens to her often she tells me she did talk to [employer] and they said that it may take up to three weeks to get the brace approved for her. Hopefully that will not take that long but nonetheless in the meantime I do think the cast could be of benefit. 10/12/18 on evaluation today patient appears to be doing rather well in regard to her toe ulcer. It's just been a few days and already this is significantly improved both as far as overall appearance and size. Fortunately there's no sign of infection. She is here for her first obligatory cast change. 10/19/18 Seen today for follow up and management of left great toe ulcer. Wound continues to show improvement. Noted small open area with seroussang drainage with palpation. Denies any increased pain or recent fevers during visit. She will continue calcium alginate with offloading shoe. Denies any questions or concerns during visit. 10/26/18 on evaluation today patient appears to be doing about the same as when I last saw her in regard to her wound bed. Fortunately there does not appear to be any signs of infection. Unfortunately she continues to have a breakdown in regard to the toe region any time that she is not in the cast. It takes  almost no time at all for this to happen. Nonetheless she still has not heard anything from the brace being made by [employer] as to when exactly this will be available to her. Fortunately there is no signs of infection at this time. 10/30/18 on evaluation today patient presents for application of the total contact cast as we just received him this morning. Fortunately we are gonna be able to apply this to her today which is great news. She continues to have no significant pain which is good news. Overall I do feel like things have been improving while she was the cast is when she doesn't have a cast that things get worse. She still has not really heard anything from [place] regarding her brace. 11/02/18 upon evaluation today patient's wound already appears to be doing significantly better which is good news. Fortunately there does not appear to be any signs of infection also good news. Overall I do think the total contact cast as before is helping to heal this area unfortunately it's just not gonna likely keep the area closed and healed without her getting her brace at least. Again the foot drop is a significant issue for her. 11/09/18 on evaluation today patient appears to be doing excellent in regard to her toe ulcer which in fact is completely healed. Fortunately we finally got the situation squared away with the paperwork which was needed to proceed with getting her brace approved by Medicaid. I have filled that out unfortunately that information has been sent to the orthopedic office that I worked at 2 1/2 years ago and not tired Current wound care measures. Fortunately she seems to be doing very well at this time. 11/23/18 on evaluation today patient appears to be doing More Poorly Compared to Last Time I Saw Her. At [facility] She Had Completely Healed. Currently she is continuing to have issues with reopening. She states that she just found out that the brace was approved through Medicaid now she  just has to go get measured in order to have this fitted for her and then made. Subsequently she does not have an  appointment for this yet that is going to complicate things we obviously cannot put her back in the cast if we do not have everything measured because they're not gonna be able to measure her foot while she is in the cast. 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 Obviously this is unfortunate and does have me somewhat worried as well. 11/30/18 on evaluation today patient's toe ulcer actually appears to be doing fairly well. The good news is she will be getting her brace in the shoes next week on Wednesday. Hopefully we will be able to get this to heal without having to go back in the cast however she may need the cast in order to get the wound completely heal and then go from there. Fortunately there's no signs of infection at this time. 12/07/18 on evaluation today patient fortunately did receive her brace and she states she could tell this definitely makes her walk better. With that being said she's been having issues with her toe where she noticed yesterday there was a lot of tissue [name], [name] ([id number]) that was loosing off this appears to be much larger than what it was previous. She also states that her leg has been read putting much across the top of her foot just about the ankle although this seems to be receiving somewhat. The total area is still red and appears to be someone infected as best I can tell. She is previously taken Bactrim and that may be a good option for her today as well. We are gonna see what I wound culture shows as well and I think that this is definitely appropriate. With that being said outside of the culture I still need to initiate something in the interim and that's what I'm gonna go ahead and select Bactrim is a good option for her. 12/14/18 on  evaluation today patient appears to be doing better in regard to her left great toe ulcer as compared to last week's evaluation. There's still some erythema although this is significantly improved which is excellent news. Overall I do believe that she is making good progress is still gonna take some time before she is where I would like her to be from the standpoint of being able to place her back into the total contact cast. Hopefully we will be where we need to be by next week. 12/21/18 on evaluation today patient actually appears to be doing poorly in regard to her toe ulcer. She's been tolerating the dressing changes without complication. Fortunately there's no signs of systemic infection although she does have a lot of drainage from the toe ulcer and this does seem to be causing some issues at this point. She does have erythema on the distal portion of her toe that appears to be likely cellulitis. 12/28/18 on evaluation today patient actually appears to be doing a little better in my pinion in regard to her toe ulcer. With that being said she still does have some evidence of infection at this time and for her culture she had both E. coli as well as enterococcus as organisms noted on evaluation. For that reason I think that though the Keflex likely has treated the E. coli rather well this has really done nothing for the enterococcus. We are going to have to initiate treatment for this specifically. Electronic Signature(s) Signed: 12/29/2018 6:13:30 AM By: Worthy Keeler PA-C  Entered By: Worthy Keeler on 12/29/2018 05:22:12 [name] ([id number]) -------------------------------------------------------------------------------- Physical Exam Details Patient Name: SPRADLIN, Keundra A. Date of Service: 12/28/2018 11:00 AM Medical Record Number: [id number] Patient Account Number: [id number] Date of Birth/Sex: [birth year] (43 y.o. F) Treating RN: Montey Hora Primary Care Provider: Priscille Kluver  Other Clinician: Referring Provider: Priscille Kluver Treating Provider/Extender: STONE III, Tahiri Shareef Weeks in Treatment: 59 Constitutional Well-nourished and well-hydrated in no acute distress. Respiratory normal breathing without difficulty. clear to auscultation bilaterally. Cardiovascular regular rate and rhythm with normal S1, S2. Psychiatric this patient is able to make decisions and demonstrates good insight into disease process. Alert and Oriented x 3. pleasant and cooperative. Notes Upon evaluation today patient actually appears to be doing a little better she did require some sharp debridement thing away necrotic tissue from the surface of the wound. I still think she really needs to be in total contact cast to get this to heal but again I cannot do that until I get the infection under control. Electronic Signature(s) Signed: 12/29/2018 6:13:30 AM By: Worthy Keeler PA-C Entered By: Worthy Keeler on 12/29/2018 05:22:47 [name] ([id number]) -------------------------------------------------------------------------------- Physician Orders Details Patient Name: Heather Dear A. Date of Service: 12/28/2018 11:00 AM Medical Record Number: [id number] Patient Account Number: [id number] Date of Birth/Sex: 17-Sep-1976 (43 y.o. 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 Anesthetic (add to Medication List) Wound #5 Left Toe Great o Topical Lidocaine 4% cream applied to wound bed prior to debridement (In Clinic Only). Primary  Wound Dressing Wound #5 Left Toe Great o Silver Alginate - Gentamicin on wound bed Secondary Dressing Wound #5 Left Toe Great o Conform/Kerlix - foam Dressing Change Frequency Wound #5 Left Toe Great o Three times weekly Follow-up Appointments Wound #5 Left Toe Great o Return Appointment in 1 week. Off-Loading Wound #5 Left Toe Great o Open toe surgical shoe to: Medications-please add to medication list. Wound #5 Left Toe Great o P.O. Antibiotics - Finish all antibiotics o Topical Antibiotic Patient Medications Marinello, Takeila A. ([id number]) Allergies: erythromycin base Notifications Medication Indication Start End Augmentin 12/28/2018 DOSE 1 - oral 875 mg-125 mg tablet - 1 tablet oral taken 2 times a day for 14 days Electronic Signature(s) Signed: 12/28/2018 11:36:27 AM By: Worthy Keeler PA-C Entered By: Worthy Keeler on 12/28/2018 11:36:26 Laramee, Hanifa A. ([id number]) -------------------------------------------------------------------------------- Problem List Details Patient Name: Heather Dear A. Date of Service: 12/28/2018 11:00 AM Medical Record Number: [id number] Patient Account Number: [id number] Date of Birth/Sex: 10/04/1976 (43 y.o. F) Treating RN: Montey Hora Primary Care Provider: Priscille Kluver Other Clinician: Referring Provider: Priscille Kluver Treating Provider/Extender: Melburn Hake, Amariyon Maynes Weeks in Treatment: 20 Active Problems ICD-10 Evaluated Encounter Code Description Active Date Today Diagnosis E11.621 Type 2 diabetes mellitus with foot ulcer 08/07/2018 No Yes L97.522 Non-pressure chronic ulcer of other part of left foot with fat 08/08/2018 No Yes layer exposed M21.372 Foot drop, left foot 09/11/2018 No Yes Inactive Problems Resolved Problems ICD-10 Code Description Active Date Resolved Date L03.032 Cellulitis of left toe 08/07/2018 08/07/2018 Electronic Signature(s) Signed: 12/29/2018 6:13:30 AM By: Melburn Hake,  Kayden Amend PA-C Entered By:  Worthy Keeler on 12/28/2018 11:27:04 [name] ([id number]) -------------------------------------------------------------------------------- Progress Note Details Patient Name: [name], [name] Date of Service: 12/28/2018 11:00 AM Medical Record Number: [id number] Patient Account Number: [id number] Date of Birth/Sex: Jul 16, 1976 (43 y.o. F) Treating RN: Montey Hora Primary Care Provider: Priscille Kluver Other Clinician: Referring Provider: Priscille Kluver Treating Provider/Extender: Melburn Hake, Irlene Crudup Weeks in Treatment: 20 Subjective Chief Complaint Information obtained from Patient Left great toe ulcer History of Present Illness (HPI) 01/18/18-She is here for initial evaluation of the left great toe ulcer. She is a poor historian in regards to timeframe in detail. She states approximately 4 weeks ago she lacerated her toe on something in the house. She followed up with her primary care who placed her on Bactrim and ultimately a second dose of Bactrim prior to coming to wound clinic. She states she has been treating the toe with peroxide, Betadine and a Band-Aid. She did not check her blood sugar this morning but checked it yesterday morning it was 327; she is unaware of a recent A1c and there are no current records. She saw Dr. she would've orthopedics last week for an old injury to the left ankle, she states he did not see her toe, nor did she bring it to his attention. She smokes approximately 1 pack cigarettes a day. Her social situation is concerning, she arrives this morning with her mother who appears extremely intoxicated/under the influence; her mother was asked to leave the room and be monitored by the patient's grandmother. The patient's aunt then accompanied the patient and the room throughout the rest of the appointment. We had a lengthy discussion regarding the deleterious effects of uncontrolled hyperglycemia and smoking as it relates to wound healing and overall health. She  was strongly encouraged to decrease her smoking and get her diabetes under better control. She states she is currently on a diet and has cut down her [facility] consumption. The left toe is erythematous, macerated and slightly edematous with malodor present. The edema in her left foot is below her baseline, there is no erythema streaking. We will treat her with Santyl, doxycycline; we have ordered and xray, culture and provided a Peg assist surgical shoe and cultured the wound. 01/25/18-She is here in follow-up evaluation for a left great toe ulcer and presents with an abscess to her suprapubic area. She states her blood sugars remain elevated, feeling "sick" and if levels are below 250, but she is trying. She has made no attempt to decrease her smoking stating that we "can't take away her food in her cigarettes". She has been compliant with offloading using the PEG assist you. She is using Santyl daily. the culture obtained last week grew staph aureus and Enterococcus faecalis; continues on the doxycycline and Augmentin was added on Monday. The suprapubic area has erythema, no femoral variation, purple discoloration, minimal induration, was accessed with a cotton tip applicator with sanguinopurulent drainage, this was cultured, I suspect the current antibiotic treatment will cover and we will not add anything to her current treatment plan. She was advised to go to urgent care or ER with any change in redness, induration or fever. 02/01/18-She is here in follow-up evaluation for left great toe ulcers and a new abdominal abscess from last week. She was able to use packing until earlier this week, where she "forgot it was there". She states she was feeling ill with GI symptoms last week and was not taking her antibiotic. She states  her glucose levels have been predominantly less than 200, with occasional levels between 200-250. She thinks this was contributing to her GI symptoms as they have resolved  without intervention. There continues to be significant laceration to left toe, otherwise it clinically looks stable/improved. There is now less superficial opening to the lateral aspect of the great toe that was residual blister. We will transition to [facility] to all wounds, she will continue her Augmentin. If there is no change or deterioration next week for reculture. 02/08/18-She is here in follow-up evaluation for left great toe ulcer and abdominal ulcer. There is an improvement in both wounds. She has been wrapping her left toe with coban, not by our direction, which has created an area of discoloration to the medial aspect; she has been advised to NOT use coban secondary to her neuropathy. She states her glucose levels have been high over this last week ranging from 200-350, she continues to smoke. She admits to being less compliant with her offloading shoe. We will continue with same treatment plan and she will follow-up next week. 02/15/18-She is here in follow-up evaluation for left great toe ulcer and abdominal ulcer. The abdominal ulcer is epithelialized. The left great toe ulcer is improved and all injury from last week using the Coban wrap is resolved, the lateral ulcer is healed. She admits to noncompliance with wearing offloading shoe and admits to glucose levels being greater than 300 most of the week. She continues to smoke and expresses no desire to quit. There is one area medially that probes deeper than it has historically, erythema to the toe and dorsal foot has consistently waxed and waned. There is no overt signs of cellulitis or infection but we will culture the wound for any occult infection given the new area of depth and erythema. We will hold off on Guerette, Oluwadamilola A. ([id number]) sensitivities for initiation of antibiotic therapy. 02/22/18-She is here in follow up evaluation for left great toe ulcer. There is overall significant improvement in both wound appearance,  erythema and edema with changes made last week. She was not initiated on antibiotic therapy. Culture obtained last week showed oxacillin sensitive staph aureus, sensitive to clindamycin. Clindamycin has been called into the pharmacy but she has been instructed to hold off on initiation secondary to overall clinical improvement and her history of antibiotic intolerance. She has been instructed to contact the clinic with any noted changes/deterioration and the wound, erythema, edema and/or pain. She will follow-up next week. She continues to smoke and her glucose levels remain elevated >250; she admits to compliance with offloading shoe 03/01/18 on evaluation today patient appears to be doing fairly well in regard to her left first toe ulcer. She has been tolerating the dressing changes with the [facility] Dressing without complication and overall this has definitely showed signs of improvement according to records as well is what the patient tells me today. I'm very pleased in that regard. She is having no pain today 03/08/18 She is here for follow up evaluation of a left great toe ulcer. She remains non-compliant with glucose control and smoking cessation; glucose levels consistently >200. She states that she got new shoe inserts/peg assist. She admits to compliance with offloading. Since my last evaluation there is significant improvement. We will switch to prisma at this time and she will follow up next week. She is noted to be tachycardic at this appointment, heart rate 120s; she has a history of heart rate 70-130 according to our records.  She admits to extreme agitation r/t personal issues; she was advised to monitor her heartrate and contact her physician if it does not return to a more normal range (<100). She takes cardizem twice daily. 03/15/18-She is here in follow-up evaluation for left great toe ulcer. She remains noncompliant with glucose control and smoking cessation. She admits to  compliance with wearing offloading shoe. The ulcer is improved/stable and we will continue with the same treatment plan and she will follow-up next week 03/22/18-She is here for evaluation for left great toe ulcer. There continues to be significant improvement despite recurrent hyperglycemia (over 500 yesterday) and she continues to smoke. She has been compliant with offloading and we will continue with same treatment plan and she will follow-up next week. 03/29/18-She is here for evaluation for left great toe ulcer. Despite continuing to smoke and uncontrolled diabetes she continues to improve. She is compliant with offloading shoe. We will continue with the same treatment plan and she will follow-up next week 04/05/18- She is here in follow up evaluation for a left great toe ulcer; she presents with small pustule to left fifth toe (resembles ant bite). She admits to compliance with wearing offloading shoe; continues to smoke or have uncontrolled blood glucose control. There is more callus than usual with evidence of bleeding; she denies known trauma. 04/12/18-She is here for evaluation of left great toe ulcer. Despite noncompliance with glycemic control and smoking she continues to make improvement. She continues to wear offloading shoe. The pustule, that was identified last week, to the left fifth toe is resolved. She will follow-up in 2 weeks 05/03/18-she is seen in follow-up evaluation for a left great toe ulcer. She is compliant with offloading, otherwise noncompliant with glycemic control and smoking. She has plateaued and there is minimal improvement noted. We will transition to [facility], replaced the insert to her surgical shoe and she will follow-up in one week 05/10/18- She is here in follow up evaluation for a left great toe ulcer. It appears stable despite measurement change. We will continue with same treatment plan and follow up next week. 05/24/18-She is seen in follow-up evaluation  for a left great toe ulcer. She remains compliant with offloading, has made significant improvement in her diet, decreasing the amount of sugar/soda. She said her recent A1c was 10.9 which is lower than. She did see a diabetic nutritionist/educator yesterday. She continues to smoke. We will continue with the same treatment plan and she'll follow-up next week. 05/31/18- She is seen in follow-up evaluation for left great toe ulcer. She continues to remain compliant with offloading, continues to make improvement in her diet, increasing her water and decreasing the amount of sugar/soda. She does continue to smoke with no desire to quit. We will apply Prisma to the depth and Hydrofera Blue over. We have not received insurance authorization for oasis. She will follow up next week. 06/07/18-She is seen in follow-up evaluation for left great toe ulcer. It has stalled according to today's measurements although base appears stable. She says she saw a diabetic educator yesterday; her average blood sugars are less than 300 which is an improvement for her. She continues to smoke and states "that's my next step" She continues with water over soda. We will order for xray, culture and reinstate ace wrap compression prior to placing apligraf for next week. She is voicing no complaints or concerns. Her dressing will change to iodoflex over the next week in preparation for apligraf. 06/14/18-She is seen in follow-up  evaluation for left great toe ulcer. Plain film x-ray performed last week was negative for osteomyelitis. Wound culture obtained last week grew strep B and OSSA; she is initiated on keflex and cefdinir today; there is erythema to the toe which could be from ace wrap compression, she has a history of wrapping too tight and has has been encouraged to maintain ace wraps that we place today. We will hold off on application of apligraf today, will apply next week after antibiotic therapy has been initiated. She  admits today that she has resumed taking a shower with her foot/toe submerged in water, she has been reminded to keep foot/toe out of the bath water. She will be seen in follow up next week 06/21/18-she is seen in follow-up evaluation for left great toe ulcer. She is tolerating antibiotic therapy with no GI disturbance. The wound is stable. Apligraf was applied today. She has been decreasing her smoking, only had 4 cigarettes yesterday and [name], [name] ([id number]) 1 today. She continues being more compliant in diabetic diet. She will follow-up next week for evaluation of site, if stable will remove at 2 weeks. 06/28/18- She is here in follow up evalution. Apligraf was placed last week, she states the dressing fell off on Tuesday and she was dressing with hydrofera blue. She is healed and will be discharged from the clinic today. She has been instructed to continue with smoking cessation, continue monitoring glucose levels, offloading for an additional 4 weeks and continue with hydrofera blue for additional two weeks for any possible microscopic opening. Readmission: 08/07/18 on evaluation today patient presents for reevaluation concerning the ulcer of her right great toe. She was previously discharged on 06/28/18 healed. Nonetheless she states that this began to show signs of drainage she subsequently went to her primary care provider. Subsequently an x-ray was performed on 08/01/18 which was negative. The patient was also placed on antibiotics at that time. Fortunately they should have been effective for the infection. Nonetheless she's been experiencing some improvement but still has a lot of drainage coming from the wound itself. 08/14/18 on evaluation today patient's wound actually does show signs of improvement in regard to the erythema at this point. She has completed the antibiotics. With that being said we did discuss the possibility of placing her in a total contact cast as of today  although I think that I may want to give this just a little bit more time to ensure nothing recurrence as far as her infection is concerned. I do not want to put in the cast and risk infection at that time if things are not completely resolved. With that being said she is gonna require some debridement today. 08/21/18 on evaluation today patient actually appears to be doing okay in regard to her toe ulcer. She's been tolerating the dressing changes without complication. With that being said it does appear that she is ready and in fact I think it's appropriate for [place] to go ahead and initiate the total contact cast today. Nonetheless she will require some sharp debridement to prepare the wound for application. Overall I feel like things have been progressing well but we do need to do something to get this to close more readily. 08/24/18 patient seen today for reevaluation after having had the total contact cast applied on Tuesday. She seems to have done very well the wound appears to be doing great and overall I'm pleased with the progress that she's made. There were no abnormal areas of rubbing  from the cast on her lower extremity. 08/30/18 on evaluation today patient actually appears to be completely healed in regard to her plantar toe ulcer. She tells me at this point she's been having a lot of issues with the cast. She almost fell a couple of times the state shall the step of her dog a couple times as well. This is been a very frustrating process for her other nonetheless she has completely healed the wound which is excellent news. Overall there does not appear to be the evidence of infection at this time which is great news. 09/11/18 evaluation today patient presents for follow-up concerning her great toe ulcer on the left which has unfortunately reopened since I last saw her which was only a couple of weeks ago. Unfortunately she was not able to get in to get the shoe and potentially the AFO  that's gonna be necessary due to her left foot drop. She continues with offloading shoe but this is not enough to prevent her from reopening it appears. When we last had her in the total contact cast she did well from a healing standpoint but unfortunately the wound reopened as soon as she came out of the cast within just a couple of weeks. 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 This is also part of the reason why she falls on a regular basis. I just do not believe that is gonna be safe for [place] to reinitiate the total contact cast as last time we had this on she fell 3 times one week which is definitely not normal for her. 09/18/18 upon evaluation today the patient actually appears to be doing about the same in regard to her toe ulcer. She did not contact [facility] as I asked her to even though I had given her the prescription. In fact she actually states that she has no idea where the prescription is. She did apparently call [facility] and they told her that all she needed to do was bring the prescription in order to be able to be seen and work on getting the AFO for her left foot. With all that being said she still does not have an appointment and I'm not sure were things stand that regard. I will give her a new prescription today in order to contact them to get this set up. 09/25/18 on evaluation today patient actually appears to be doing about the same in regard to her toes ulcer. She does have a small areas which seems to have a lot of callous buildup around the edge of the wound which is going to need sharp debridement today. She still is waiting to be scheduled for evaluation with [facility] for possibility of an AFO. She states there supposed to call her tomorrow to get this set up. Unfortunately it  does appear that her foot specifically the toe area is showing signs of erythema. There does not appear to be any systemic infection which is in these good news. [name], [name] ([id number]) 10/02/18 on evaluation today patient actually appears to be doing about the same in regard to her toe ulcer. This really has not done too well although it's not significantly larger it's also not significantly smaller. She has been tolerating the dressing changes without complication. She actually has  her appointment with [facility] and [place] tomorrow to hopefully be measured for obtaining and AFO splint. I think this would be helpful preventing this from reoccurring. We had contemplated starting the cast this week although to be honest I am reluctant to do that as she's been having nausea, vomiting, and seizure activity over the past three days. She has a history of seizures and have been told is nothing that can be done for these. With that being said I do believe that along with the seizures have the nausea vomiting which upon further questioning doesn't seem to be the normal for her and makes me concerned for the possibility of infection or something else going on. I discussed this with the patient and her mother during the office visit today. I do not feel the wound is effective but maybe something else. The responses this was "this just happens to her at times and we don't know why". They did not seem to be interested in going to the hospital to have this checked out further. 10/09/18 on evaluation today patient presents for follow-up concerning her ongoing toe ulcer. She has been tolerating the dressing changes without complication. Fortunately there does not appear to be any evidence of infection which is great news however I do think that the patient would benefit from going ahead for with the total contact cast. She's actually in a wheelchair today she tells me that she will use her walker if we  initiate the cast. I was very specific about the fact that if we were gonna do the cast I wanted to make sure that she was using the walker in order to prevent any falls. She tells me she does not have stairs that she has to traverse on a regular basis at her home. She has not had any seizures since last week again that something that happens to her often she tells me she did talk to [employer] and they said that it may take up to three weeks to get the brace approved for her. Hopefully that will not take that long but nonetheless in the meantime I do think the cast could be of benefit. 10/12/18 on evaluation today patient appears to be doing rather well in regard to her toe ulcer. It's just been a few days and already this is significantly improved both as far as overall appearance and size. Fortunately there's no sign of infection. She is here for her first obligatory cast change. 10/19/18 Seen today for follow up and management of left great toe ulcer. Wound continues to show improvement. Noted small open area with seroussang drainage with palpation. Denies any increased pain or recent fevers during visit. She will continue calcium alginate with offloading shoe. Denies any questions or concerns during visit. 10/26/18 on evaluation today patient appears to be doing about the same as when I last saw her in regard to her wound bed. Fortunately there does not appear to be any signs of infection. Unfortunately she continues to have a breakdown in regard to the toe region any time that she is not in the cast. It takes almost no time at all for this to happen. Nonetheless she still has not heard anything from the brace being made by [employer] as to when exactly this will be available to her. Fortunately there is no signs of infection at this time. 10/30/18 on evaluation today patient presents for application of the total contact cast as we just received him this morning. Fortunately we are gonna be  able to  apply this to her today which is great news. She continues to have no significant pain which is good news. Overall I do feel like things have been improving while she was the cast is when she doesn't have a cast that things get worse. She still has not really heard anything from [place] regarding her brace. 11/02/18 upon evaluation today patient's wound already appears to be doing significantly better which is good news. Fortunately there does not appear to be any signs of infection also good news. Overall I do think the total contact cast as before is helping to heal this area unfortunately it's just not gonna likely keep the area closed and healed without her getting her brace at least. Again the foot drop is a significant issue for her. 11/09/18 on evaluation today patient appears to be doing excellent in regard to her toe ulcer which in fact is completely healed. Fortunately we finally got the situation squared away with the paperwork which was needed to proceed with getting her brace approved by Medicaid. I have filled that out unfortunately that information has been sent to the orthopedic office that I worked at 2 1/2 years ago and not tired Current wound care measures. Fortunately she seems to be doing very well at this time. 11/23/18 on evaluation today patient appears to be doing More Poorly Compared to Last Time I Saw Her. At [facility] She Had Completely Healed. Currently she is continuing to have issues with reopening. She states that she just found out that the brace was approved through Medicaid now she just has to go get measured in order to have this fitted for her and then made. Subsequently she does not have an appointment for this yet that is going to complicate things we obviously cannot put her back in the cast if we do not have everything measured because they're not gonna be able to measure her foot while she is in the cast. Unfortunately the other thing that I found out today as  well is that she was in the hospital over the weekend due to having a heroin overdose. Obviously this is unfortunate and does have me somewhat worried as well. 11/30/18 on evaluation today patient's toe ulcer actually appears to be doing fairly well. The good news is she will be getting her Pipkins, Nisreen A. ([id number]) brace in the shoes next week on Wednesday. Hopefully we will be able to get this to heal without having to go back in the cast however she may need the cast in order to get the wound completely heal and then go from there. Fortunately there's no signs of infection at this time. 12/07/18 on evaluation today patient fortunately did receive her brace and she states she could tell this definitely makes her walk better. With that being said she's been having issues with her toe where she noticed yesterday there was a lot of tissue that was loosing off this appears to be much larger than what it was previous. She also states that her leg has been read putting much across the top of her foot just about the ankle although this seems to be receiving somewhat. The total area is still red and appears to be someone infected as best I can tell. She is previously taken Bactrim and that may be a good option for her today as well. We are gonna see what I wound culture shows as well and I think that this is definitely appropriate. With that being  said outside of the culture I still need to initiate something in the interim and that's what I'm gonna go ahead and select Bactrim is a good option for her. 12/14/18 on evaluation today patient appears to be doing better in regard to her left great toe ulcer as compared to last week's evaluation. There's still some erythema although this is significantly improved which is excellent news. Overall I do believe that she is making good progress is still gonna take some time before she is where I would like her to be from the standpoint of being able to place her  back into the total contact cast. Hopefully we will be where we need to be by next week. 12/21/18 on evaluation today patient actually appears to be doing poorly in regard to her toe ulcer. She's been tolerating the dressing changes without complication. Fortunately there's no signs of systemic infection although she does have a lot of drainage from the toe ulcer and this does seem to be causing some issues at this point. She does have erythema on the distal portion of her toe that appears to be likely cellulitis. 12/28/18 on evaluation today patient actually appears to be doing a little better in my pinion in regard to her toe ulcer. With that being said she still does have some evidence of infection at this time and for her culture she had both E. coli as well as enterococcus as organisms noted on evaluation. For that reason I think that though the Keflex likely has treated the E. coli rather well this has really done nothing for the enterococcus. We are going to have to initiate treatment for this specifically. Patient History Information obtained from Patient. Social History Current every day smoker. Medical History Eyes Patient has history of Cataracts, Glaucoma, Optic Neuritis Ear/Nose/Mouth/Throat Patient has history of Chronic sinus problems/congestion, Middle ear problems Endocrine Patient has history of Type II Diabetes Review of Systems (ROS) Constitutional Symptoms ([place]) Denies complaints or symptoms of Fever, Chills. Respiratory The patient has no complaints or symptoms. Cardiovascular The patient has no complaints or symptoms. Psychiatric The patient has no complaints or symptoms. Lubben, Kylena A. ([id number]) Objective Constitutional Well-nourished and well-hydrated in no acute distress. Vitals Time Taken: 10:59 AM, Height: 69 in, Weight: 240 lbs, BMI: 35.4, Temperature: 98.2 F, Pulse: 81 bpm, Respiratory Rate: 16 breaths/min, Blood Pressure: 105/73  mmHg. Respiratory normal breathing without difficulty. clear to auscultation bilaterally. Cardiovascular regular rate and rhythm with normal S1, S2. Psychiatric this patient is able to make decisions and demonstrates good insight into disease process. Alert and Oriented x 3. pleasant and cooperative. General Notes: Upon evaluation today patient actually appears to be doing a little better she did require some sharp debridement thing away necrotic tissue from the surface of the wound. I still think she really needs to be in total contact cast to get this to heal but again I cannot do that until I get the infection under control. Integumentary (Hair, Skin) Wound #5 status is Open. Original cause of wound was Gradually Appeared. The wound is located on the Left Toe Great. The wound measures 2cm length x 1cm width x 0.3cm depth; 1.571cm^2 area and 0.471cm^3 volume. There is Fat Layer (Subcutaneous Tissue) Exposed exposed. There is no tunneling or undermining noted. There is a medium amount of serous drainage noted. The wound margin is epibole. There is small (1-33%) pink granulation within the wound bed. There is a medium (34-66%) amount of necrotic tissue within the wound bed  including [place]. The periwound skin appearance exhibited: Callus, Maceration, Erythema. The periwound skin appearance did not exhibit: Crepitus, Excoriation, Induration, Rash, Scarring, Dry/Scaly, Atrophie Blanche, Cyanosis, Ecchymosis, Hemosiderin Staining, Mottled, Pallor, Rubor. The surrounding wound skin color is noted with erythema which is circumferential. Periwound temperature was noted as No Abnormality. Assessment Active Problems ICD-10 Type 2 diabetes mellitus with foot ulcer Non-pressure chronic ulcer of other part of left foot with fat layer exposed Foot drop, left foot Procedures Wound #5 Pre-procedure diagnosis of Wound #5 is a Diabetic Wound/Ulcer of the Lower Extremity located on the Left  Toe Great .Severity of Tissue Pre Debridement is: Fat layer exposed. There was a Excisional Skin/Subcutaneous Tissue [name], [name] ([id number]) Debridement with a total area of 2 sq cm performed by STONE III, Polly Barner E., PA-C. With the following instrument(s): Curette to remove Viable and Non-Viable tissue/material. Material removed includes Callus, Subcutaneous Tissue, and Slough after achieving pain control using Lidocaine 4% Topical Solution. No specimens were taken. A time out was conducted at 11:29, prior to the start of the procedure. A Minimum amount of bleeding was controlled with Pressure. The procedure was tolerated well with a pain level of 0 throughout and a pain level of 0 following the procedure. Post Debridement Measurements: 2cm length x 1.7cm width x 0.3cm depth; 0.801cm^3 volume. Character of Wound/Ulcer Post Debridement is improved. Severity of Tissue Post Debridement is: Fat layer exposed. Post procedure Diagnosis Wound #5: Same as Pre-Procedure Plan Wound Cleansing: Wound #5 Left Toe Great: Clean wound with Normal Saline. Anesthetic (add to Medication List): Wound #5 Left Toe Great: Topical Lidocaine 4% cream applied to wound bed prior to debridement (In Clinic Only). Primary Wound Dressing: Wound #5 Left Toe Great: Silver Alginate - Gentamicin on wound bed Secondary Dressing: Wound #5 Left Toe Great: Conform/Kerlix - foam Dressing Change Frequency: Wound #5 Left Toe Great: Three times weekly Follow-up Appointments: Wound #5 Left Toe Great: Return Appointment in 1 week. Off-Loading: Wound #5 Left Toe Great: Open toe surgical shoe to: Medications-please add to medication list.: Wound #5 Left Toe Great: P.O. Antibiotics - Finish all antibiotics Topical Antibiotic The following medication(s) was prescribed: Augmentin oral 875 mg-125 mg tablet 1 1 tablet oral taken 2 times a day for 14 days starting 12/28/2018 My suggestion at this point is gonna be that we go  ahead and initiate the above wound care measures including treatment with the Augmentin which will be added to the regimen of Keflex at this point. This will be for 14 days as well. If anything changes or worsens she will contact the office and let me know. Otherwise we're gonna see were things stand at follow-up. Please see above for specific wound care orders. We will see patient for re-evaluation in 1 week(s) here in the clinic. If anything worsens or changes patient will contact our office for additional recommendations. Electronic Signature(s) Signed: 12/29/2018 6:13:30 AM By: Ronn Melena, Carnisha A. ([id number]) Entered By: Worthy Keeler on 12/29/2018 05:23:16 [name] ([id number]) -------------------------------------------------------------------------------- ROS/PFSH Details Patient Name: Heather Dear A. Date of Service: 12/28/2018 11:00 AM Medical Record Number: [id number] Patient Account Number: [id number] Date of Birth/Sex: July 04, 1976 (43 y.o. F) Treating RN: Montey Hora Primary Care Provider: Priscille Kluver Other Clinician: Referring Provider: Priscille Kluver Treating Provider/Extender: STONE III, Kyle Luppino Weeks in Treatment: 20 Information Obtained From Patient Wound History Do you currently have one or more open woundso Yes How many open wounds do you currently haveo 1 Constitutional Symptoms (General  Health) Complaints and Symptoms: Negative for: Fever; Chills Eyes Medical History: Positive for: Cataracts; Glaucoma; Optic Neuritis Ear/Nose/Mouth/Throat Medical History: Positive for: Chronic sinus problems/congestion; Middle ear problems Respiratory Complaints and Symptoms: No Complaints or Symptoms Cardiovascular Complaints and Symptoms: No Complaints or Symptoms Endocrine Medical History: Positive for: Type II Diabetes Time with diabetes: 10 years Treated with: Insulin, Oral agents Blood sugar tested every day: No Blood sugar testing  results: Lunch: 350 Psychiatric Complaints and Symptoms: No Complaints or Symptoms HBO Extended History Items [name], [name] ([id number]) Eyes: Eyes: Ear/Nose/Mouth/Throat: Ear/Nose/Mouth/Throat: Cataracts Glaucoma Chronic sinus Middle ear problems problems/congestion Immunizations Pneumococcal Vaccine: Received Pneumococcal Vaccination: No Implantable Devices No devices added Family and Social History Current every day smoker Physician Affirmation I have reviewed and agree with the above information. Electronic Signature(s) Signed: 12/29/2018 6:13:30 AM By: Worthy Keeler PA-C Signed: 12/31/2018 10:44:46 AM By: Montey Hora Entered By: Worthy Keeler on 12/29/2018 05:22:30 [name] ([id number]) -------------------------------------------------------------------------------- SuperBill Details Patient Name: Heather Dear A. Date of Service: 12/28/2018 Medical Record Number: [id number] Patient Account Number: [id number] Date of Birth/Sex: 01-14-1976 (43 y.o. F) Treating RN: Montey Hora Primary Care Provider: Priscille Kluver Other Clinician: Referring Provider: Priscille Kluver Treating Provider/Extender: Melburn Hake, Skyelar Swigart Weeks in Treatment: 20 Diagnosis Coding ICD-10 Codes Code Description E11.621 Type 2 diabetes mellitus with foot ulcer L97.522 Non-pressure chronic ulcer of other part of left foot with fat layer exposed M21.372 Foot drop, left foot Facility Procedures CPT4 Code: 24097353 Description: 29924 - DEB SUBQ TISSUE 20 SQ CM/< ICD-10 Diagnosis Description L97.522 Non-pressure chronic ulcer of other part of left foot with fat Modifier: layer exposed Quantity: 1 Physician Procedures CPT4 Code: 2683419 Description: 62229 - WC PHYS LEVEL 4 - EST PT ICD-10 Diagnosis Description E11.621 Type 2 diabetes mellitus with foot ulcer L97.522 Non-pressure chronic ulcer of other part of left foot with fat M21.372 Foot drop, left foot Modifier: 25 layer exposed Quantity:  1 CPT4 Code: 7989211 Description: 94174 - WC PHYS SUBQ TISS 20 SQ CM ICD-10 Diagnosis Description L97.522 Non-pressure chronic ulcer of other part of left foot with fat Modifier: layer exposed Quantity: 1 Electronic Signature(s) Signed: 12/29/2018 6:13:30 AM By: Worthy Keeler PA-C Entered By: Worthy Keeler on 12/29/2018 05:27:33

## 2019-01-04 ENCOUNTER — Other Ambulatory Visit: Payer: Self-pay

## 2019-01-04 ENCOUNTER — Encounter: Payer: Medicaid Other | Admitting: Physician Assistant

## 2019-01-04 DIAGNOSIS — E11621 Type 2 diabetes mellitus with foot ulcer: Secondary | ICD-10-CM | POA: Diagnosis not present

## 2019-01-06 NOTE — Progress Notes (Signed)
AJNA, MOORS ([id number]) Visit Report for 01/04/2019 Arrival Information Details Patient Name: [name], [name] Date of Service: 01/04/2019 10:00 AM Medical Record Number: [id number] Patient Account Number: [id number] Date of Birth/Sex: 1976-08-21 (43 y.o. F) Treating RN: Montey Hora Primary Care Tahjae Durr: Priscille Kluver Other Clinician: Referring Miciah Covelli: Priscille Kluver Treating Caide Campi/Extender: Melburn Hake, HOYT Weeks in Treatment: 21 Visit Information History Since Last Visit Added or deleted any medications: No Patient Arrived: Wheel Chair Any new allergies or adverse reactions: No Arrival Time: 10:14 Had a fall or experienced change in No activities of daily living that may affect Accompanied By: mother risk of falls: Transfer Assistance: None Signs or symptoms of abuse/neglect since last visito No Patient Identification Verified: Yes Hospitalized since last visit: No Secondary Verification Process Completed: Yes Implantable device outside of the clinic excluding No Patient Requires Transmission-Based No cellular tissue based products placed in the center Precautions: since last visit: Patient Has Alerts: No Has Dressing in Place as Prescribed: Yes Pain Present Now: No Electronic Signature(s) Signed: 01/04/2019 3:13:32 PM By: Lorine Bears RCP, RRT, CHT Entered By: Lorine Bears on 01/04/2019 10:14:33 [name] ([id number]) -------------------------------------------------------------------------------- Encounter Discharge Information Details Patient Name: [name] A. Date of Service: 01/04/2019 10:00 AM Medical Record Number: [id number] Patient Account Number: [id number] Date of Birth/Sex: [birth year] (43 y.o. F) Treating RN: Cornell Barman Primary Care Tremond Shimabukuro: Priscille Kluver Other Clinician: Referring Arbadella Kimbler: Priscille Kluver Treating Allien Melberg/Extender: Melburn Hake, HOYT Weeks in Treatment: 21 Encounter Discharge Information Items  Post Procedure Vitals Discharge Condition: Stable Temperature (F): 97.7 Ambulatory Status: Wheelchair Pulse (bpm): 86 Discharge Destination: Home Respiratory Rate (breaths/min): 16 Transportation: Private Auto Blood Pressure (mmHg): 101/70 Accompanied By: mom Schedule Follow-up Appointment: Yes Clinical Summary of Care: Electronic Signature(s) Signed: 01/04/2019 3:22:43 PM By: Gretta Cool, BSN, RN, CWS, [name] RN, BSN Entered By: Gretta Cool, BSN, RN, CWS, [name] on 01/04/2019 15:22:43 [name] ([id number]) -------------------------------------------------------------------------------- Lower Extremity Assessment Details Patient Name: [name] A. Date of Service: 01/04/2019 10:00 AM Medical Record Number: [id number] Patient Account Number: [id number] Date of Birth/Sex: 09-19-1976 (42 y.o. F) Treating RN: Army Melia Primary Care Cornie Herrington: Priscille Kluver Other Clinician: Referring Zamari Bonsall: Priscille Kluver Treating Kerilyn Cortner/Extender: STONE III, HOYT Weeks in Treatment: 21 Edema Assessment Assessed: [Left: No] [Right: No] Edema: [Left: N] [Right: o] Electronic Signature(s) Signed: 01/04/2019 3:17:15 PM By: Army Melia Entered By: Army Melia on 01/04/2019 10:23:54 [name], [name] ([id number]) -------------------------------------------------------------------------------- Multi Wound Chart Details Patient Name: [name] A. Date of Service: 01/04/2019 10:00 AM Medical Record Number: [id number] Patient Account Number: [id number] Date of Birth/Sex: April 04, 1976 (43 y.o. 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] Comorbid History: [5:Cataracts, Glaucoma, Optic Neuritis, Chronic sinus problems/congestion, Middle ear problems, Type II Diabetes] [N/A:N/A] Date Acquired: [5:11/16/2018] [N/A:N/A] Weeks of Treatment: [5:6] [N/A:N/A] Wound Status: [5:Open] [N/A:N/A] Measurements L x W x D [5:2x1x0.3] [N/A:N/A] (cm) Area (cm) : [5:1.571] [N/A:N/A] Volume (cm) : [5:0.471] [N/A:N/A] % Reduction in Area: [5:-299.70%] [N/A:N/A] % Reduction in Volume: [5:-200.00%] [N/A:N/A] Classification: [5:Grade 3] [N/A:N/A] Exudate Amount: [5:Medium] [N/A:N/A] Exudate Type: [5:Serous] [N/A:N/A] Exudate Color: [5:amber] [N/A:N/A] Wound Margin: [5:Epibole] [N/A:N/A] Granulation Amount: [5:Small (1-33%)] [N/A:N/A] Granulation  Quality: [5:Pink] [N/A:N/A] Necrotic Amount: [5:Medium (34-66%)] [N/A:N/A] Exposed Structures: [5:Fat Layer (Subcutaneous Tissue) Exposed: Yes Fascia: No Tendon: No Muscle: No Joint: No Bone: No] [N/A:N/A] Epithelialization: [5:None] [N/A:N/A] Periwound Skin Texture: [5:Callus: Yes Excoriation: No Induration: No] [N/A:N/A] Crepitus: No Rash: No Scarring: No Periwound Skin Moisture: Maceration: Yes N/A N/A Dry/Scaly: No Periwound Skin Color: Erythema: Yes N/A N/A Atrophie Blanche: No Cyanosis: No Ecchymosis: No Hemosiderin Staining: No Mottled: No Pallor: No Rubor: No Erythema Location: Circumferential N/A N/A Temperature: No Abnormality N/A N/A Tenderness on Palpation: No N/A N/A Wound Preparation: Ulcer Cleansing: N/A N/A Rinsed/Irrigated with Saline Topical Anesthetic Applied: Other: lidocaine 4% Treatment Notes Electronic Signature(s) Signed: 01/04/2019 5:05:27 PM By: Montey Hora Entered By: Montey Hora on 01/04/2019 10:46:11 [name]  ([id number]) -------------------------------------------------------------------------------- [place] Details Patient Name: [name] A. Date of Service: 01/04/2019 10:00 AM Medical Record Number: [id number] Patient Account Number: [id number] Date of Birth/Sex: 21-Sep-1976 (43 y.o. F) Treating RN: Montey Hora Primary Care Rochele Lueck: Priscille Kluver Other Clinician: Referring Odessa Nishi: Priscille Kluver Treating Nayra Coury/Extender: Melburn Hake, HOYT Weeks in Treatment: 21 Active Inactive Abuse / Safety / Falls / Self Care Management Nursing Diagnoses: History of Falls Goals: Patient will not experience any injury related to falls Date Initiated: 08/07/2018 Target Resolution Date: 10/19/2018 Goal Status: Active Interventions: Assess fall risk on admission and as needed Notes: Orientation to the Wound Care Program Nursing Diagnoses: Knowledge deficit related to the wound healing center program Goals: Patient/caregiver will verbalize understanding of the [facility] Date Initiated: 08/07/2018 Target Resolution Date: 10/19/2018 Goal Status: Active Interventions: Provide education on orientation to the wound center Notes: Soft Tissue Infection Nursing Diagnoses: Impaired tissue integrity Goals: Patient will remain free of wound infection Date Initiated: 08/07/2018 Target Resolution Date: 10/19/2018 Goal Status: Active Interventions: Assess signs and symptoms of infection every visit Tesmer, Dee A. ([id number]) Notes: Wound/Skin Impairment Nursing Diagnoses: Impaired tissue integrity Goals: Ulcer/skin breakdown will heal within 14 weeks Date Initiated: 08/07/2018 Target Resolution Date: 10/19/2018 Goal Status: Active Interventions: Assess patient/caregiver ability to obtain necessary supplies Notes: Electronic Signature(s) Signed: 01/04/2019 5:05:27 PM By: Montey Hora Entered By: Montey Hora on 01/04/2019 [name],  [name] ([id number]) -------------------------------------------------------------------------------- Pain Assessment Details Patient Name: [name] A. Date of Service: 01/04/2019 10:00 AM Medical Record Number: [id number] Patient Account Number: [id number] Date of Birth/Sex: January 26, 1976 (43 y.o. F) Treating RN: Montey Hora Primary Care Sherryll Skoczylas: Priscille Kluver Other Clinician: Referring Chelsia Serres: Priscille Kluver Treating Kaled Allende/Extender: STONE III, HOYT Weeks in Treatment: 21 Active Problems Location of Pain Severity and Description of Pain Patient Has Paino No Site Locations Pain Management and Medication Current Pain Management: Electronic Signature(s) Signed: 01/04/2019 3:13:32 PM By: Paulla Fore, RRT, CHT Signed: 01/04/2019 5:05:27 PM By: Montey Hora Entered By: Lorine Bears on 01/04/2019 10:14:42 [name] ([id number]) -------------------------------------------------------------------------------- Patient/Caregiver Education Details Patient Name: [name] A. Date of Service: 01/04/2019 10:00 AM Medical Record Number: [id number] Patient Account Number: [id number] Date of Birth/Gender: 1976/02/25 (43 y.o. F) Treating RN: Montey Hora Primary Care Physician: Priscille Kluver Other Clinician: Referring Physician: Priscille Kluver Treating Physician/Extender: Sharalyn Ink in Treatment: 21 Education Assessment Education Provided To: Patient and Caregiver Education Topics Provided Offloading: Handouts: Other: TCC precautions Methods: Explain/Verbal Responses: State content correctly Electronic Signature(s) Signed: 01/04/2019 5:05:27 PM By: Montey Hora Entered By: Montey Hora on 01/04/2019 10:51:45 [name] ([id number]) -------------------------------------------------------------------------------- Wound Assessment Details Patient Name: [name] A. Date of Service: 01/04/2019 10:00 AM Medical  Record Number:  [id number] Patient Account Number: [id number] Date of Birth/Sex: 1976-04-12 (43 y.o. F) Treating RN: Army Melia Primary Care Darien Mignogna: Priscille Kluver Other Clinician: Referring Shaundrea Carrigg: Priscille Kluver Treating Dajanae Brophy/Extender: STONE III, HOYT Weeks in Treatment: 21 Wound Status Wound Number: 5 Primary Diabetic Wound/Ulcer of the Lower Extremity Etiology: Wound Location: Left Toe Great Wound Open Wounding Event: Gradually Appeared Status: Date Acquired: 11/16/2018 Comorbid Cataracts, Glaucoma, Optic Neuritis, Chronic Weeks Of Treatment: 6 History: sinus problems/congestion, Middle ear problems, Clustered Wound: No Type II Diabetes Photos Photo Uploaded By: Army Melia on 01/04/2019 13:31:09 Wound Measurements Length: (cm) 2 Width: (cm) 1 Depth: (cm) 0.3 Area: (cm) 1.571 Volume: (cm) 0.471 % Reduction in Area: -299.7% % Reduction in Volume: -200% Epithelialization: None Tunneling: No Undermining: No Wound Description Classification: Grade 3 Wound Margin: Epibole Exudate Amount: Medium Exudate Type: Serous Exudate Color: amber Foul Odor After Cleansing: No Slough/Fibrino No Wound Bed Granulation Amount: Small (1-33%) Exposed Structure Granulation Quality: Pink Fascia Exposed: No Necrotic Amount: Medium (34-66%) Fat Layer (Subcutaneous Tissue) Exposed: Yes Necrotic Quality: Adherent Slough Tendon Exposed: No Muscle Exposed: No Joint Exposed: No Bone Exposed: No Periwound Skin Texture [name], [name] ([id number]) Texture Color No Abnormalities Noted: No No Abnormalities Noted: No Callus: Yes Atrophie Blanche: No Crepitus: No Cyanosis: No Excoriation: No Ecchymosis: No Induration: No Erythema: Yes Rash: No Erythema Location: Circumferential Scarring: No Hemosiderin Staining: No Mottled: No Moisture Pallor: No No Abnormalities Noted: No Rubor: No Dry / Scaly: No Maceration: Yes Temperature / Pain Temperature: No  Abnormality Wound Preparation Ulcer Cleansing: Rinsed/Irrigated with Saline Topical Anesthetic Applied: Other: lidocaine 4%, Treatment Notes Wound #5 (Left Toe Great) Notes gentamicin cream, silvercel, gauze and conform; TCC applied to left foot Electronic Signature(s) Signed: 01/04/2019 3:17:15 PM By: Army Melia Entered By: Army Melia on 01/04/2019 10:23:41 [name] ([id number]) -------------------------------------------------------------------------------- Vitals Details Patient Name: [name] A. Date of Service: 01/04/2019 10:00 AM Medical Record Number: [id number] Patient Account Number: [id number] Date of Birth/Sex: July 29, 1976 (43 y.o. F) Treating RN: Montey Hora Primary Care Diasha Castleman: Priscille Kluver Other Clinician: Referring Isyss Espinal: Priscille Kluver Treating Aya Geisel/Extender: STONE III, HOYT Weeks in Treatment: 21 Vital Signs Time Taken: 10:14 Temperature (F): 97.7 Height (in): 69 Pulse (bpm): 86 Weight (lbs): 240 Respiratory Rate (breaths/min): 16 Body Mass Index (BMI): 35.4 Blood Pressure (mmHg): 101/70 Reference Range: 80 - 120 mg / dl Electronic Signature(s) Signed: 01/04/2019 3:13:32 PM By: Lorine Bears RCP, RRT, CHT Entered By: Becky Sax, Amado Nash on 01/04/2019 10:18:23

## 2019-01-06 NOTE — Progress Notes (Signed)
CARMINA, WALLE ([id number]) Visit Report for 01/04/2019 Chief Complaint Document Details Patient Name: HARMONEY, SIENKIEWICZ A. Date of Service: 01/04/2019 10:00 AM Medical Record Number: [id number] Patient Account Number: [id number] Date of Birth/Sex: [birth year] (43 y.o. F) Treating RN: Montey Hora Primary Care Provider: Priscille Kluver Other Clinician: Referring Provider: Priscille Kluver Treating Provider/Extender: Melburn Hake, HOYT Weeks in Treatment: 21 Information Obtained from: Patient Chief Complaint Left great toe ulcer Electronic Signature(s) Signed: 01/04/2019 5:40:27 PM By: Worthy Keeler PA-C Entered By: Worthy Keeler on 01/04/2019 10:22:49 [name] ([id number]) -------------------------------------------------------------------------------- Debridement Details Patient Name: [name] Date of Service: 01/04/2019 10:00 AM Medical Record Number: [id number] Patient Account Number: [id number] Date of Birth/Sex: December 25, 1975 (43 y.o. F) Treating RN: Montey Hora Primary Care Provider: Priscille Kluver Other Clinician: Referring Provider: Priscille Kluver Treating Provider/Extender: STONE III, HOYT Weeks in Treatment: 21 Debridement Performed for Wound #5 Left Toe Great Assessment: Performed By: Physician STONE III, HOYT E., PA-C Debridement Type: Debridement Severity of Tissue Pre Fat layer exposed Debridement: Level of Consciousness (Pre- Awake and Alert procedure): Pre-procedure Verification/Time Yes - 10:47 Out Taken: Start Time: 10:47 Pain Control: Lidocaine 4% Topical Solution Total Area Debrided (L x W): 2 (cm) x 1 (cm) = 2 (cm) Tissue and other material Viable, Non-Viable, Callus, Slough, Subcutaneous, Slough debrided: Level: Skin/Subcutaneous Tissue Debridement Description: Excisional Instrument: Curette Bleeding: Minimum Hemostasis Achieved: Pressure End Time: 10:50 Procedural Pain: 0 Post Procedural Pain: 0 Response to Treatment: Procedure was  tolerated well Level of Consciousness Awake and Alert (Post-procedure): Post Debridement Measurements of Total Wound Length: (cm) 2 Width: (cm) 1 Depth: (cm) 0.4 Volume: (cm) 0.628 Character of Wound/Ulcer Post Debridement: Improved Severity of Tissue Post Debridement: Fat layer exposed Post Procedure Diagnosis Same as Pre-procedure Electronic Signature(s) Signed: 01/04/2019 5:05:27 PM By: Montey Hora Signed: 01/04/2019 5:40:27 PM By: Worthy Keeler PA-C Entered By: Montey Hora on 01/04/2019 10:50:23 Flock, Franchelle A. ([id number]) -------------------------------------------------------------------------------- HPI Details Patient Name: [name] Date of Service: 01/04/2019 10:00 AM Medical Record Number: [id number] Patient Account Number: [id number] Date of Birth/Sex: 1976-07-17 (43 y.o. F) Treating RN: Montey Hora Primary Care Provider: Priscille Kluver Other Clinician: Referring Provider: Priscille Kluver Treating Provider/Extender: Melburn Hake, HOYT Weeks in Treatment: 21 History of Present Illness HPI Description: 01/18/18-She is here for initial evaluation of the left great toe ulcer. She is a poor historian in regards to timeframe in detail. She states approximately 4 weeks ago she lacerated her toe on something in the house. She followed up with her primary care who placed her on Bactrim and ultimately a second dose of Bactrim prior to coming to wound clinic. She states she has been treating the toe with peroxide, Betadine and a Band-Aid. She did not check her blood sugar this morning but checked it yesterday morning it was 327; she is unaware of a recent A1c and there are no current records. She saw Dr. she would've orthopedics last week for an old injury to the left ankle, she states he did not see her toe, nor did she bring it to his attention. She smokes approximately 1 pack cigarettes a day. Her social situation is concerning, she arrives this morning with her  mother who appears extremely intoxicated/under the influence; her mother was asked to leave the room and be monitored by the patient's grandmother. The patient's aunt then accompanied the patient and the room throughout the rest of the appointment. We had a lengthy discussion regarding the deleterious effects of uncontrolled hyperglycemia and smoking  as it relates to wound healing and overall health. She was strongly encouraged to decrease her smoking and get her diabetes under better control. She states she is currently on a diet and has cut down her [facility] consumption. The left toe is erythematous, macerated and slightly edematous with malodor present. The edema in her left foot is below her baseline, there is no erythema streaking. We will treat her with Santyl, doxycycline; we have ordered and xray, culture and provided a Peg assist surgical shoe and cultured the wound. 01/25/18-She is here in follow-up evaluation for a left great toe ulcer and presents with an abscess to her suprapubic area. She states her blood sugars remain elevated, feeling "sick" and if levels are below 250, but she is trying. She has made no attempt to decrease her smoking stating that we "can't take away her food in her cigarettes". She has been compliant with offloading using the PEG assist you. She is using Santyl daily. the culture obtained last week grew staph aureus and Enterococcus faecalis; continues on the doxycycline and Augmentin was added on Monday. The suprapubic area has erythema, no femoral variation, purple discoloration, minimal induration, was accessed with a cotton tip applicator with sanguinopurulent drainage, this was cultured, I suspect the current antibiotic treatment will cover and we will not add anything to her current treatment plan. She was advised to go to urgent care or ER with any change in redness, induration or fever. 02/01/18-She is here in follow-up evaluation for left great toe ulcers  and a new abdominal abscess from last week. She was able to use packing until earlier this week, where she "forgot it was there". She states she was feeling ill with GI symptoms last week and was not taking her antibiotic. She states her glucose levels have been predominantly less than 200, with occasional levels between 200-250. She thinks this was contributing to her GI symptoms as they have resolved without intervention. There continues to be significant laceration to left toe, otherwise it clinically looks stable/improved. There is now less superficial opening to the lateral aspect of the great toe that was residual blister. We will transition to [facility] to all wounds, she will continue her Augmentin. If there is no change or deterioration next week for reculture. 02/08/18-She is here in follow-up evaluation for left great toe ulcer and abdominal ulcer. There is an improvement in both wounds. She has been wrapping her left toe with coban, not by our direction, which has created an area of discoloration to the medial aspect; she has been advised to NOT use coban secondary to her neuropathy. She states her glucose levels have been high over this last week ranging from 200-350, she continues to smoke. She admits to being less compliant with her offloading shoe. We will continue with same treatment plan and she will follow-up next week. 02/15/18-She is here in follow-up evaluation for left great toe ulcer and abdominal ulcer. The abdominal ulcer is epithelialized. The left great toe ulcer is improved and all injury from last week using the Coban wrap is resolved, the lateral ulcer is healed. She admits to noncompliance with wearing offloading shoe and admits to glucose levels being greater than 300 most of the week. She continues to smoke and expresses no desire to quit. There is one area medially that probes deeper than it has historically, erythema to the toe and dorsal foot has consistently  waxed and waned. There is no overt signs of cellulitis or infection but we  will culture the wound for any occult infection given the new area of depth and erythema. We will hold off on sensitivities for initiation of antibiotic therapy. 02/22/18-She is here in follow up evaluation for left great toe ulcer. There is overall significant improvement in both wound appearance, erythema and edema with changes made last week. She was not initiated on antibiotic therapy. Culture obtained last week showed oxacillin sensitive staph aureus, sensitive to clindamycin. Clindamycin has been called into the pharmacy but she has been instructed to hold off on initiation secondary to overall clinical improvement and her history of antibiotic intolerance. She has been instructed to contact the clinic with any noted changes/deterioration and the wound, erythema, Cumber, Shajuana A. ([id number]) edema and/or pain. She will follow-up next week. She continues to smoke and her glucose levels remain elevated >250; she admits to compliance with offloading shoe 03/01/18 on evaluation today patient appears to be doing fairly well in regard to her left first toe ulcer. She has been tolerating the dressing changes with the [facility] Dressing without complication and overall this has definitely showed signs of improvement according to records as well is what the patient tells me today. I'm very pleased in that regard. She is having no pain today 03/08/18 She is here for follow up evaluation of a left great toe ulcer. She remains non-compliant with glucose control and smoking cessation; glucose levels consistently >200. She states that she got new shoe inserts/peg assist. She admits to compliance with offloading. Since my last evaluation there is significant improvement. We will switch to prisma at this time and she will follow up next week. She is noted to be tachycardic at this appointment, heart rate 120s; she has a history of  heart rate 70-130 according to our records. She admits to extreme agitation r/t personal issues; she was advised to monitor her heartrate and contact her physician if it does not return to a more normal range (<100). She takes cardizem twice daily. 03/15/18-She is here in follow-up evaluation for left great toe ulcer. She remains noncompliant with glucose control and smoking cessation. She admits to compliance with wearing offloading shoe. The ulcer is improved/stable and we will continue with the same treatment plan and she will follow-up next week 03/22/18-She is here for evaluation for left great toe ulcer. There continues to be significant improvement despite recurrent hyperglycemia (over 500 yesterday) and she continues to smoke. She has been compliant with offloading and we will continue with same treatment plan and she will follow-up next week. 03/29/18-She is here for evaluation for left great toe ulcer. Despite continuing to smoke and uncontrolled diabetes she continues to improve. She is compliant with offloading shoe. We will continue with the same treatment plan and she will follow-up next week 04/05/18- She is here in follow up evaluation for a left great toe ulcer; she presents with small pustule to left fifth toe (resembles ant bite). She admits to compliance with wearing offloading shoe; continues to smoke or have uncontrolled blood glucose control. There is more callus than usual with evidence of bleeding; she denies known trauma. 04/12/18-She is here for evaluation of left great toe ulcer. Despite noncompliance with glycemic control and smoking she continues to make improvement. She continues to wear offloading shoe. The pustule, that was identified last week, to the left fifth toe is resolved. She will follow-up in 2 weeks 05/03/18-she is seen in follow-up evaluation for a left great toe ulcer. She is compliant with offloading, otherwise noncompliant with  glycemic control and smoking.  She has plateaued and there is minimal improvement noted. We will transition to [facility], replaced the insert to her surgical shoe and she will follow-up in one week 05/10/18- She is here in follow up evaluation for a left great toe ulcer. It appears stable despite measurement change. We will continue with same treatment plan and follow up next week. 05/24/18-She is seen in follow-up evaluation for a left great toe ulcer. She remains compliant with offloading, has made significant improvement in her diet, decreasing the amount of sugar/soda. She said her recent A1c was 10.9 which is lower than. She did see a diabetic nutritionist/educator yesterday. She continues to smoke. We will continue with the same treatment plan and she'll follow-up next week. 05/31/18- She is seen in follow-up evaluation for left great toe ulcer. She continues to remain compliant with offloading, continues to make improvement in her diet, increasing her water and decreasing the amount of sugar/soda. She does continue to smoke with no desire to quit. We will apply Prisma to the depth and Hydrofera Blue over. We have not received insurance authorization for oasis. She will follow up next week. 06/07/18-She is seen in follow-up evaluation for left great toe ulcer. It has stalled according to today's measurements although base appears stable. She says she saw a diabetic educator yesterday; her average blood sugars are less than 300 which is an improvement for her. She continues to smoke and states "that's my next step" She continues with water over soda. We will order for xray, culture and reinstate ace wrap compression prior to placing apligraf for next week. She is voicing no complaints or concerns. Her dressing will change to iodoflex over the next week in preparation for apligraf. 06/14/18-She is seen in follow-up evaluation for left great toe ulcer. Plain film x-ray performed last week was negative for osteomyelitis. Wound  culture obtained last week grew strep B and OSSA; she is initiated on keflex and cefdinir today; there is erythema to the toe which could be from ace wrap compression, she has a history of wrapping too tight and has has been encouraged to maintain ace wraps that we place today. We will hold off on application of apligraf today, will apply next week after antibiotic therapy has been initiated. She admits today that she has resumed taking a shower with her foot/toe submerged in water, she has been reminded to keep foot/toe out of the bath water. She will be seen in follow up next week 06/21/18-she is seen in follow-up evaluation for left great toe ulcer. She is tolerating antibiotic therapy with no GI disturbance. The wound is stable. Apligraf was applied today. She has been decreasing her smoking, only had 4 cigarettes yesterday and 1 today. She continues being more compliant in diabetic diet. She will follow-up next week for evaluation of site, if stable will remove at 2 weeks. 06/28/18- She is here in follow up evalution. Apligraf was placed last week, she states the dressing fell off on Tuesday and she was dressing with hydrofera blue. She is healed and will be discharged from the clinic today. She has been instructed to continue with smoking cessation, continue monitoring glucose levels, offloading for an additional 4 weeks and continue with Kain, Fraya A. ([id number]) hydrofera blue for additional two weeks for any possible microscopic opening. Readmission: 08/07/18 on evaluation today patient presents for reevaluation concerning the ulcer of her right great toe. She was previously discharged on 06/28/18 healed. Nonetheless she states that this  began to show signs of drainage she subsequently went to her primary care provider. Subsequently an x-ray was performed on 08/01/18 which was negative. The patient was also placed on antibiotics at that time. Fortunately they should have been effective for  the infection. Nonetheless she's been experiencing some improvement but still has a lot of drainage coming from the wound itself. 08/14/18 on evaluation today patient's wound actually does show signs of improvement in regard to the erythema at this point. She has completed the antibiotics. With that being said we did discuss the possibility of placing her in a total contact cast as of today although I think that I may want to give this just a little bit more time to ensure nothing recurrence as far as her infection is concerned. I do not want to put in the cast and risk infection at that time if things are not completely resolved. With that being said she is gonna require some debridement today. 08/21/18 on evaluation today patient actually appears to be doing okay in regard to her toe ulcer. She's been tolerating the dressing changes without complication. With that being said it does appear that she is ready and in fact I think it's appropriate for [place] to go ahead and initiate the total contact cast today. Nonetheless she will require some sharp debridement to prepare the wound for application. Overall I feel like things have been progressing well but we do need to do something to get this to close more readily. 08/24/18 patient seen today for reevaluation after having had the total contact cast applied on Tuesday. She seems to have done very well the wound appears to be doing great and overall I'm pleased with the progress that she's made. There were no abnormal areas of rubbing from the cast on her lower extremity. 08/30/18 on evaluation today patient actually appears to be completely healed in regard to her plantar toe ulcer. She tells me at this point she's been having a lot of issues with the cast. She almost fell a couple of times the state shall the step of her dog a couple times as well. This is been a very frustrating process for her other nonetheless she has completely healed the  wound which is excellent news. Overall there does not appear to be the evidence of infection at this time which is great news. 09/11/18 evaluation today patient presents for follow-up concerning her great toe ulcer on the left which has unfortunately reopened since I last saw her which was only a couple of weeks ago. Unfortunately she was not able to get in to get the shoe and potentially the AFO that's gonna be necessary due to her left foot drop. She continues with offloading shoe but this is not enough to prevent her from reopening it appears. When we last had her in the total contact cast she did well from a healing standpoint but unfortunately the wound reopened as soon as she came out of the cast within just a couple of weeks. 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 This is also part of the reason why she falls on a regular basis. I just do not believe that is gonna be safe for [place] to reinitiate the total contact cast as last time we had  this on she fell 3 times one week which is definitely not normal for her. 09/18/18 upon evaluation today the patient actually appears to be doing about the same in regard to her toe ulcer. She did not contact [employer] as I asked her to even though I had given her the prescription. In fact she actually states that she has no idea where the prescription is. She did apparently call [employer] and they told her that all she needed to do was bring the prescription in order to be able to be seen and work on getting the AFO for her left foot. With all that being said she still does not have an appointment and I'm not sure were things stand that regard. I will give her a new prescription today in order to contact them to get this set up. 09/25/18 on evaluation today patient  actually appears to be doing about the same in regard to her toes ulcer. She does have a small areas which seems to have a lot of callous buildup around the edge of the wound which is going to need sharp debridement today. She still is waiting to be scheduled for evaluation with [facility] for possibility of an AFO. She states there supposed to call her tomorrow to get this set up. Unfortunately it does appear that her foot specifically the toe area is showing signs of erythema. There does not appear to be any systemic infection which is in these good news. 10/02/18 on evaluation today patient actually appears to be doing about the same in regard to her toe ulcer. This really has not done too well although it's not significantly larger it's also not significantly smaller. She has been tolerating the dressing changes without complication. She actually has her appointment with [facility] and [facility] tomorrow to hopefully be measured for obtaining and AFO splint. I think this would be helpful preventing this from reoccurring. We had contemplated starting the cast this week although to be honest I am reluctant to do that as she's been having nausea, vomiting, and seizure activity over the past three days. She has a history of seizures and have been told is nothing that can be done for these. With Berns, Vadis A. ([id number]) that being said I do believe that along with the seizures have the nausea vomiting which upon further questioning doesn't seem to be the normal for her and makes me concerned for the possibility of infection or something else going on. I discussed this with the patient and her mother during the office visit today. I do not feel the wound is effective but maybe something else. The responses this was "this just happens to her at times and we don't know why". They did not seem to be interested in going to the hospital to have this checked out further. 10/09/18 on evaluation today patient  presents for follow-up concerning her ongoing toe ulcer. She has been tolerating the dressing changes without complication. Fortunately there does not appear to be any evidence of infection which is great news however I do think that the patient would benefit from going ahead for with the total contact cast. She's actually in a wheelchair today she tells me that she will use her walker if we initiate the cast. I was very specific about the fact that if we were gonna do the cast I wanted to make sure that she was using the walker in order to prevent any falls. She tells me she does not have stairs that she  has to traverse on a regular basis at her home. She has not had any seizures since last week again that something that happens to her often she tells me she did talk to [employer] and they said that it may take up to three weeks to get the brace approved for her. Hopefully that will not take that long but nonetheless in the meantime I do think the cast could be of benefit. 10/12/18 on evaluation today patient appears to be doing rather well in regard to her toe ulcer. It's just been a few days and already this is significantly improved both as far as overall appearance and size. Fortunately there's no sign of infection. She is here for her first obligatory cast change. 10/19/18 Seen today for follow up and management of left great toe ulcer. Wound continues to show improvement. Noted small open area with seroussang drainage with palpation. Denies any increased pain or recent fevers during visit. She will continue calcium alginate with offloading shoe. Denies any questions or concerns during visit. 10/26/18 on evaluation today patient appears to be doing about the same as when I last saw her in regard to her wound bed. Fortunately there does not appear to be any signs of infection. Unfortunately she continues to have a breakdown in regard to the toe region any time that she is not in the cast. It takes  almost no time at all for this to happen. Nonetheless she still has not heard anything from the brace being made by [employer] as to when exactly this will be available to her. Fortunately there is no signs of infection at this time. 10/30/18 on evaluation today patient presents for application of the total contact cast as we just received him this morning. Fortunately we are gonna be able to apply this to her today which is great news. She continues to have no significant pain which is good news. Overall I do feel like things have been improving while she was the cast is when she doesn't have a cast that things get worse. She still has not really heard anything from [place] regarding her brace. 11/02/18 upon evaluation today patient's wound already appears to be doing significantly better which is good news. Fortunately there does not appear to be any signs of infection also good news. Overall I do think the total contact cast as before is helping to heal this area unfortunately it's just not gonna likely keep the area closed and healed without her getting her brace at least. Again the foot drop is a significant issue for her. 11/09/18 on evaluation today patient appears to be doing excellent in regard to her toe ulcer which in fact is completely healed. Fortunately we finally got the situation squared away with the paperwork which was needed to proceed with getting her brace approved by Medicaid. I have filled that out unfortunately that information has been sent to the orthopedic office that I worked at 2 1/2 years ago and not tired Current wound care measures. Fortunately she seems to be doing very well at this time. 11/23/18 on evaluation today patient appears to be doing More Poorly Compared to Last Time I Saw Her. At [facility] She Had Completely Healed. Currently she is continuing to have issues with reopening. She states that she just found out that the brace was approved through Medicaid now she  just has to go get measured in order to have this fitted for her and then made. Subsequently she does not have an  appointment for this yet that is going to complicate things we obviously cannot put her back in the cast if we do not have everything measured because they're not gonna be able to measure her foot while she is in the cast. 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 Obviously this is unfortunate and does have me somewhat worried as well. 11/30/18 on evaluation today patient's toe ulcer actually appears to be doing fairly well. The good news is she will be getting her brace in the shoes next week on Wednesday. Hopefully we will be able to get this to heal without having to go back in the cast however she may need the cast in order to get the wound completely heal and then go from there. Fortunately there's no signs of infection at this time. 12/07/18 on evaluation today patient fortunately did receive her brace and she states she could tell this definitely makes her walk better. With that being said she's been having issues with her toe where she noticed yesterday there was a lot of tissue Pounders, Alijah A. ([id number]) that was loosing off this appears to be much larger than what it was previous. She also states that her leg has been read putting much across the top of her foot just about the ankle although this seems to be receiving somewhat. The total area is still red and appears to be someone infected as best I can tell. She is previously taken Bactrim and that may be a good option for her today as well. We are gonna see what I wound culture shows as well and I think that this is definitely appropriate. With that being said outside of the culture I still need to initiate something in the interim and that's what I'm gonna go ahead and select Bactrim is a good option for her. 12/14/18 on  evaluation today patient appears to be doing better in regard to her left great toe ulcer as compared to last week's evaluation. There's still some erythema although this is significantly improved which is excellent news. Overall I do believe that she is making good progress is still gonna take some time before she is where I would like her to be from the standpoint of being able to place her back into the total contact cast. Hopefully we will be where we need to be by next week. 12/21/18 on evaluation today patient actually appears to be doing poorly in regard to her toe ulcer. She's been tolerating the dressing changes without complication. Fortunately there's no signs of systemic infection although she does have a lot of drainage from the toe ulcer and this does seem to be causing some issues at this point. She does have erythema on the distal portion of her toe that appears to be likely cellulitis. 12/28/18 on evaluation today patient actually appears to be doing a little better in my pinion in regard to her toe ulcer. With that being said she still does have some evidence of infection at this time and for her culture she had both E. coli as well as enterococcus as organisms noted on evaluation. For that reason I think that though the Keflex likely has treated the E. coli rather well this has really done nothing for the enterococcus. We are going to have to initiate treatment for this specifically. 01/04/19 on evaluation today patient's toe actually appears to be doing  better from the standpoint of infection. She currently would like to see about putting the cash back on I think that this is appropriate as long as she takes care of it and keeps it from getting wet. She is gonna have some drainage we can definitely pass this up with Drawtex and alginate to try to prevent as much drainage as possible from causing the problems. With that being said I do want to at least try her with the cast between now  and Tuesday. If there any issues we can't continue to use it then I will discontinue the use of the cast at that point. Electronic Signature(s) Signed: 01/04/2019 5:40:27 PM By: Worthy Keeler PA-C Entered By: Worthy Keeler on 01/04/2019 10:54:16 [name] ([id number]) -------------------------------------------------------------------------------- Physical Exam Details Patient Name: [name] Date of Service: 01/04/2019 10:00 AM Medical Record Number: [id number] Patient Account Number: [id number] Date of Birth/Sex: 1976-08-02 (43 y.o. F) Treating RN: Montey Hora Primary Care Provider: Priscille Kluver Other Clinician: Referring Provider: Priscille Kluver Treating Provider/Extender: STONE III, HOYT Weeks in Treatment: 21 Constitutional Well-nourished and well-hydrated in no acute distress. Respiratory normal breathing without difficulty. Psychiatric this patient is able to make decisions and demonstrates good insight into disease process. Alert and Oriented x 3. pleasant and cooperative. Notes Patient's wound bed currently did require some sharp agreement clear way necrotic tissue and callous from the edge of the wound and the surface of the wound. She tolerated this without any pain or complication unfortunately the pain is better I think this is a good indication that the infection is cleared it also appears to be cleared visually in my pinion. Electronic Signature(s) Signed: 01/04/2019 5:40:27 PM By: Worthy Keeler PA-C Entered By: Worthy Keeler on 01/04/2019 10:54:46 [name] ([id number]) -------------------------------------------------------------------------------- Physician Orders Details Patient Name: [name] Date of Service: 01/04/2019 10:00 AM Medical Record Number: [id number] Patient Account Number: [id number] Date of Birth/Sex: 09-20-1976 (43 y.o. 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 Anesthetic (add to Medication List) Wound #5 Left Toe Great o Topical Lidocaine 4% cream applied to wound bed prior to debridement (In Clinic Only). Primary Wound Dressing Wound #5 Left Toe Great o Silver Alginate - Gentamicin on wound bed Secondary Dressing Wound #5 Left Toe Great o Foam o Drawtex Dressing Change Frequency Wound #5 Left Toe Great o Change dressing every week Follow-up Appointments Wound #5 Left Toe Great o Other: - Tuesday 01/08/2019 Off-Loading Wound #5 Left Toe Great o Total Contact Cast to Left Lower Extremity Medications-please add to medication list. Wound #5 Left Toe Great o P.O. Antibiotics - Finish all antibiotics o Topical Antibiotic SANDHYA, DENHERDER A. ([id number]) Electronic Signature(s) Signed: 01/04/2019 5:05:27 PM By: Montey Hora Signed: 01/04/2019 5:40:27 PM By: Worthy Keeler PA-C Entered By: Montey Hora on 01/04/2019 10:50:52 [name] ([id number]) -------------------------------------------------------------------------------- Problem List Details Patient Name: [name] Date of Service: 01/04/2019 10:00 AM Medical Record Number: [id number] Patient Account Number: [id number] Date of Birth/Sex: [birth year] (43 y.o. F) Treating RN: Montey Hora Primary Care Provider: Priscille Kluver Other Clinician: Referring  Provider: Priscille Kluver Treating Provider/Extender: Melburn Hake, HOYT Weeks in Treatment: 21 Active Problems ICD-10 Evaluated Encounter Code Description Active Date Today  Diagnosis E11.621 Type 2 diabetes mellitus with foot ulcer 08/07/2018 No Yes L97.522 Non-pressure chronic ulcer of other part of left foot with fat 08/08/2018 No Yes layer exposed M21.372 Foot drop, left foot 09/11/2018 No Yes Inactive Problems Resolved Problems ICD-10 Code Description Active Date Resolved Date L03.032 Cellulitis of left toe 08/07/2018 08/07/2018 Electronic Signature(s) Signed: 01/04/2019 5:40:27 PM By: Worthy Keeler PA-C Entered By: Worthy Keeler on 01/04/2019 10:22:32 [name] ([id number]) -------------------------------------------------------------------------------- Progress Note Details Patient Name: [name] Date of Service: 01/04/2019 10:00 AM Medical Record Number: [id number] Patient Account Number: [id number] Date of Birth/Sex: [birth year] (43 y.o. F) Treating RN: Montey Hora Primary Care Provider: Priscille Kluver Other Clinician: Referring Provider: Priscille Kluver Treating Provider/Extender: Melburn Hake, HOYT Weeks in Treatment: 21 Subjective Chief Complaint Information obtained from Patient Left great toe ulcer History of Present Illness (HPI) 01/18/18-She is here for initial evaluation of the left great toe ulcer. She is a poor historian in regards to timeframe in detail. She states approximately 4 weeks ago she lacerated her toe on something in the house. She followed up with her primary care who placed her on Bactrim and ultimately a second dose of Bactrim prior to coming to wound clinic. She states she has been treating the toe with peroxide, Betadine and a Band-Aid. She did not check her blood sugar this morning but checked it yesterday morning it was 327; she is unaware of a recent A1c and there are no current records. She saw Dr. she would've orthopedics last week for an old injury to the left ankle, she states he did not see her toe, nor did she bring it to his attention. She smokes approximately 1 pack cigarettes a day. Her social  situation is concerning, she arrives this morning with her mother who appears extremely intoxicated/under the influence; her mother was asked to leave the room and be monitored by the patient's grandmother. The patient's aunt then accompanied the patient and the room throughout the rest of the appointment. We had a lengthy discussion regarding the deleterious effects of uncontrolled hyperglycemia and smoking as it relates to wound healing and overall health. She was strongly encouraged to decrease her smoking and get her diabetes under better control. She states she is currently on a diet and has cut down her [facility] consumption. The left toe is erythematous, macerated and slightly edematous with malodor present. The edema in her left foot is below her baseline, there is no erythema streaking. We will treat her with Santyl, doxycycline; we have ordered and xray, culture and provided a Peg assist surgical shoe and cultured the wound. 01/25/18-She is here in follow-up evaluation for a left great toe ulcer and presents with an abscess to her suprapubic area. She states her blood sugars remain elevated, feeling "sick" and if levels are below 250, but she is trying. She has made no attempt to decrease her smoking stating that we "can't take away her food in her cigarettes". She has been compliant with offloading using the PEG assist you. She is using Santyl daily. the culture obtained last week grew staph aureus and Enterococcus faecalis; continues on the doxycycline and Augmentin was added on Monday. The suprapubic area has erythema, no femoral variation, purple discoloration, minimal induration, was accessed with a cotton tip applicator with sanguinopurulent drainage, this was cultured, I suspect the current antibiotic  treatment will cover and we will not add anything to her current treatment plan. She was advised to go to urgent care or ER with any change in redness, induration or  fever. 02/01/18-She is here in follow-up evaluation for left great toe ulcers and a new abdominal abscess from last week. She was able to use packing until earlier this week, where she "forgot it was there". She states she was feeling ill with GI symptoms last week and was not taking her antibiotic. She states her glucose levels have been predominantly less than 200, with occasional levels between 200-250. She thinks this was contributing to her GI symptoms as they have resolved without intervention. There continues to be significant laceration to left toe, otherwise it clinically looks stable/improved. There is now less superficial opening to the lateral aspect of the great toe that was residual blister. We will transition to [facility] to all wounds, she will continue her Augmentin. If there is no change or deterioration next week for reculture. 02/08/18-She is here in follow-up evaluation for left great toe ulcer and abdominal ulcer. There is an improvement in both wounds. She has been wrapping her left toe with coban, not by our direction, which has created an area of discoloration to the medial aspect; she has been advised to NOT use coban secondary to her neuropathy. She states her glucose levels have been high over this last week ranging from 200-350, she continues to smoke. She admits to being less compliant with her offloading shoe. We will continue with same treatment plan and she will follow-up next week. 02/15/18-She is here in follow-up evaluation for left great toe ulcer and abdominal ulcer. The abdominal ulcer is epithelialized. The left great toe ulcer is improved and all injury from last week using the Coban wrap is resolved, the lateral ulcer is healed. She admits to noncompliance with wearing offloading shoe and admits to glucose levels being greater than 300 most of the week. She continues to smoke and expresses no desire to quit. There is one area medially that probes deeper  than it has historically, erythema to the toe and dorsal foot has consistently waxed and waned. There is no overt signs of cellulitis or infection but we will culture the wound for any occult infection given the new area of depth and erythema. We will hold off on Robbs, Charissa A. ([id number]) sensitivities for initiation of antibiotic therapy. 02/22/18-She is here in follow up evaluation for left great toe ulcer. There is overall significant improvement in both wound appearance, erythema and edema with changes made last week. She was not initiated on antibiotic therapy. Culture obtained last week showed oxacillin sensitive staph aureus, sensitive to clindamycin. Clindamycin has been called into the pharmacy but she has been instructed to hold off on initiation secondary to overall clinical improvement and her history of antibiotic intolerance. She has been instructed to contact the clinic with any noted changes/deterioration and the wound, erythema, edema and/or pain. She will follow-up next week. She continues to smoke and her glucose levels remain elevated >250; she admits to compliance with offloading shoe 03/01/18 on evaluation today patient appears to be doing fairly well in regard to her left first toe ulcer. She has been tolerating the dressing changes with the [facility] Dressing without complication and overall this has definitely showed signs of improvement according to records as well is what the patient tells me today. I'm very pleased in that regard. She is having no pain today 03/08/18 She is  here for follow up evaluation of a left great toe ulcer. She remains non-compliant with glucose control and smoking cessation; glucose levels consistently >200. She states that she got new shoe inserts/peg assist. She admits to compliance with offloading. Since my last evaluation there is significant improvement. We will switch to prisma at this time and she will follow up next week. She is noted  to be tachycardic at this appointment, heart rate 120s; she has a history of heart rate 70-130 according to our records. She admits to extreme agitation r/t personal issues; she was advised to monitor her heartrate and contact her physician if it does not return to a more normal range (<100). She takes cardizem twice daily. 03/15/18-She is here in follow-up evaluation for left great toe ulcer. She remains noncompliant with glucose control and smoking cessation. She admits to compliance with wearing offloading shoe. The ulcer is improved/stable and we will continue with the same treatment plan and she will follow-up next week 03/22/18-She is here for evaluation for left great toe ulcer. There continues to be significant improvement despite recurrent hyperglycemia (over 500 yesterday) and she continues to smoke. She has been compliant with offloading and we will continue with same treatment plan and she will follow-up next week. 03/29/18-She is here for evaluation for left great toe ulcer. Despite continuing to smoke and uncontrolled diabetes she continues to improve. She is compliant with offloading shoe. We will continue with the same treatment plan and she will follow-up next week 04/05/18- She is here in follow up evaluation for a left great toe ulcer; she presents with small pustule to left fifth toe (resembles ant bite). She admits to compliance with wearing offloading shoe; continues to smoke or have uncontrolled blood glucose control. There is more callus than usual with evidence of bleeding; she denies known trauma. 04/12/18-She is here for evaluation of left great toe ulcer. Despite noncompliance with glycemic control and smoking she continues to make improvement. She continues to wear offloading shoe. The pustule, that was identified last week, to the left fifth toe is resolved. She will follow-up in 2 weeks 05/03/18-she is seen in follow-up evaluation for a left great toe ulcer. She is compliant  with offloading, otherwise noncompliant with glycemic control and smoking. She has plateaued and there is minimal improvement noted. We will transition to [facility], replaced the insert to her surgical shoe and she will follow-up in one week 05/10/18- She is here in follow up evaluation for a left great toe ulcer. It appears stable despite measurement change. We will continue with same treatment plan and follow up next week. 05/24/18-She is seen in follow-up evaluation for a left great toe ulcer. She remains compliant with offloading, has made significant improvement in her diet, decreasing the amount of sugar/soda. She said her recent A1c was 10.9 which is lower than. She did see a diabetic nutritionist/educator yesterday. She continues to smoke. We will continue with the same treatment plan and she'll follow-up next week. 05/31/18- She is seen in follow-up evaluation for left great toe ulcer. She continues to remain compliant with offloading, continues to make improvement in her diet, increasing her water and decreasing the amount of sugar/soda. She does continue to smoke with no desire to quit. We will apply Prisma to the depth and Hydrofera Blue over. We have not received insurance authorization for oasis. She will follow up next week. 06/07/18-She is seen in follow-up evaluation for left great toe ulcer. It has stalled according to today's measurements  although base appears stable. She says she saw a diabetic educator yesterday; her average blood sugars are less than 300 which is an improvement for her. She continues to smoke and states "that's my next step" She continues with water over soda. We will order for xray, culture and reinstate ace wrap compression prior to placing apligraf for next week. She is voicing no complaints or concerns. Her dressing will change to iodoflex over the next week in preparation for apligraf. 06/14/18-She is seen in follow-up evaluation for left great toe ulcer.  Plain film x-ray performed last week was negative for osteomyelitis. Wound culture obtained last week grew strep B and OSSA; she is initiated on keflex and cefdinir today; there is erythema to the toe which could be from ace wrap compression, she has a history of wrapping too tight and has has been encouraged to maintain ace wraps that we place today. We will hold off on application of apligraf today, will apply next week after antibiotic therapy has been initiated. She admits today that she has resumed taking a shower with her foot/toe submerged in water, she has been reminded to keep foot/toe out of the bath water. She will be seen in follow up next week 06/21/18-she is seen in follow-up evaluation for left great toe ulcer. She is tolerating antibiotic therapy with no GI disturbance. The wound is stable. Apligraf was applied today. She has been decreasing her smoking, only had 4 cigarettes yesterday and [place], Kristien A. ([id number]) 1 today. She continues being more compliant in diabetic diet. She will follow-up next week for evaluation of site, if stable will remove at 2 weeks. 06/28/18- She is here in follow up evalution. Apligraf was placed last week, she states the dressing fell off on Tuesday and she was dressing with hydrofera blue. She is healed and will be discharged from the clinic today. She has been instructed to continue with smoking cessation, continue monitoring glucose levels, offloading for an additional 4 weeks and continue with hydrofera blue for additional two weeks for any possible microscopic opening. Readmission: 08/07/18 on evaluation today patient presents for reevaluation concerning the ulcer of her right great toe. She was previously discharged on 06/28/18 healed. Nonetheless she states that this began to show signs of drainage she subsequently went to her primary care provider. Subsequently an x-ray was performed on 08/01/18 which was negative. The patient was also placed  on antibiotics at that time. Fortunately they should have been effective for the infection. Nonetheless she's been experiencing some improvement but still has a lot of drainage coming from the wound itself. 08/14/18 on evaluation today patient's wound actually does show signs of improvement in regard to the erythema at this point. She has completed the antibiotics. With that being said we did discuss the possibility of placing her in a total contact cast as of today although I think that I may want to give this just a little bit more time to ensure nothing recurrence as far as her infection is concerned. I do not want to put in the cast and risk infection at that time if things are not completely resolved. With that being said she is gonna require some debridement today. 08/21/18 on evaluation today patient actually appears to be doing okay in regard to her toe ulcer. She's been tolerating the dressing changes without complication. With that being said it does appear that she is ready and in fact I think it's appropriate for [place] to go ahead and initiate the total  contact cast today. Nonetheless she will require some sharp debridement to prepare the wound for application. Overall I feel like things have been progressing well but we do need to do something to get this to close more readily. 08/24/18 patient seen today for reevaluation after having had the total contact cast applied on Tuesday. She seems to have done very well the wound appears to be doing great and overall I'm pleased with the progress that she's made. There were no abnormal areas of rubbing from the cast on her lower extremity. 08/30/18 on evaluation today patient actually appears to be completely healed in regard to her plantar toe ulcer. She tells me at this point she's been having a lot of issues with the cast. She almost fell a couple of times the state shall the step of her dog a couple times as well. This is been a very  frustrating process for her other nonetheless she has completely healed the wound which is excellent news. Overall there does not appear to be the evidence of infection at this time which is great news. 09/11/18 evaluation today patient presents for follow-up concerning her great toe ulcer on the left which has unfortunately reopened since I last saw her which was only a couple of weeks ago. Unfortunately she was not able to get in to get the shoe and potentially the AFO that's gonna be necessary due to her left foot drop. She continues with offloading shoe but this is not enough to prevent her from reopening it appears. When we last had her in the total contact cast she did well from a healing standpoint but unfortunately the wound reopened as soon as she came out of the cast within just a couple of weeks. 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 This is also part of the reason why she falls on a regular basis. I just do not believe that is gonna be safe for [place] to reinitiate the total contact cast as last time we had this on she fell 3 times one week which is definitely not normal for her. 09/18/18 upon evaluation today the patient actually appears to be doing about the same in regard to her toe ulcer. She did not contact [facility] as I asked her to even though I had given her the prescription. In fact she actually states that she has no idea where the prescription is. She did apparently call [facility] and they told her that all she needed to do was bring the prescription in order to be able to be seen and work on getting the AFO for her left foot. With all that being said she still does not have an appointment and I'm not sure were things stand that regard. I will give her a new prescription today in order  to contact them to get this set up. 09/25/18 on evaluation today patient actually appears to be doing about the same in regard to her toes ulcer. She does have a small areas which seems to have a lot of callous buildup around the edge of the wound which is going to need sharp debridement today. She still is waiting to be scheduled for evaluation with [facility] for possibility of an AFO. She states there supposed to call her tomorrow to get  this set up. Unfortunately it does appear that her foot specifically the toe area is showing signs of erythema. There does not appear to be any systemic infection which is in these good news. CASMIRA, CRAMER A. ([id number]) 10/02/18 on evaluation today patient actually appears to be doing about the same in regard to her toe ulcer. This really has not done too well although it's not significantly larger it's also not significantly smaller. She has been tolerating the dressing changes without complication. She actually has her appointment with [facility] and [place] tomorrow to hopefully be measured for obtaining and AFO splint. I think this would be helpful preventing this from reoccurring. We had contemplated starting the cast this week although to be honest I am reluctant to do that as she's been having nausea, vomiting, and seizure activity over the past three days. She has a history of seizures and have been told is nothing that can be done for these. With that being said I do believe that along with the seizures have the nausea vomiting which upon further questioning doesn't seem to be the normal for her and makes me concerned for the possibility of infection or something else going on. I discussed this with the patient and her mother during the office visit today. I do not feel the wound is effective but maybe something else. The responses this was "this just happens to her at times and we don't know why". They did not seem to be interested in going to the hospital  to have this checked out further. 10/09/18 on evaluation today patient presents for follow-up concerning her ongoing toe ulcer. She has been tolerating the dressing changes without complication. Fortunately there does not appear to be any evidence of infection which is great news however I do think that the patient would benefit from going ahead for with the total contact cast. She's actually in a wheelchair today she tells me that she will use her walker if we initiate the cast. I was very specific about the fact that if we were gonna do the cast I wanted to make sure that she was using the walker in order to prevent any falls. She tells me she does not have stairs that she has to traverse on a regular basis at her home. She has not had any seizures since last week again that something that happens to her often she tells me she did talk to [employer] and they said that it may take up to three weeks to get the brace approved for her. Hopefully that will not take that long but nonetheless in the meantime I do think the cast could be of benefit. 10/12/18 on evaluation today patient appears to be doing rather well in regard to her toe ulcer. It's just been a few days and already this is significantly improved both as far as overall appearance and size. Fortunately there's no sign of infection. She is here for her first obligatory cast change. 10/19/18 Seen today for follow up and management of left great toe ulcer. Wound continues to show improvement. Noted small open area with seroussang drainage with palpation. Denies any increased pain or recent fevers during visit. She will continue calcium alginate with offloading shoe. Denies any questions or concerns during visit. 10/26/18 on evaluation today patient appears to be doing about the same as when I last saw her in regard to her wound bed. Fortunately there does not appear to be any signs of infection. Unfortunately she continues to have a  breakdown in  regard to the toe region any time that she is not in the cast. It takes almost no time at all for this to happen. Nonetheless she still has not heard anything from the brace being made by [employer] as to when exactly this will be available to her. Fortunately there is no signs of infection at this time. 10/30/18 on evaluation today patient presents for application of the total contact cast as we just received him this morning. Fortunately we are gonna be able to apply this to her today which is great news. She continues to have no significant pain which is good news. Overall I do feel like things have been improving while she was the cast is when she doesn't have a cast that things get worse. She still has not really heard anything from [place] regarding her brace. 11/02/18 upon evaluation today patient's wound already appears to be doing significantly better which is good news. Fortunately there does not appear to be any signs of infection also good news. Overall I do think the total contact cast as before is helping to heal this area unfortunately it's just not gonna likely keep the area closed and healed without her getting her brace at least. Again the foot drop is a significant issue for her. 11/09/18 on evaluation today patient appears to be doing excellent in regard to her toe ulcer which in fact is completely healed. Fortunately we finally got the situation squared away with the paperwork which was needed to proceed with getting her brace approved by Medicaid. I have filled that out unfortunately that information has been sent to the orthopedic office that I worked at 2 1/2 years ago and not tired Current wound care measures. Fortunately she seems to be doing very well at this time. 11/23/18 on evaluation today patient appears to be doing More Poorly Compared to Last Time I Saw Her. At [facility] She Had Completely Healed. Currently she is continuing to have issues with reopening. She states that  she just found out that the brace was approved through Medicaid now she just has to go get measured in order to have this fitted for her and then made. Subsequently she does not have an appointment for this yet that is going to complicate things we obviously cannot put her back in the cast if we do not have everything measured because they're not gonna be able to measure her foot while she is in the cast. 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 Obviously this is unfortunate and does have me somewhat worried as well. 11/30/18 on evaluation today patient's toe ulcer actually appears to be doing fairly well. The good news is she will be getting her Lumbert, Letetia A. ([id number]) brace in the shoes next week on Wednesday. Hopefully we will be able to get this to heal without having to go back in the cast however she may need the cast in order to get the wound completely heal and then go from there. Fortunately there's no signs of infection at this time. 12/07/18 on evaluation today patient fortunately did receive her brace and she states she could tell this definitely makes her walk better. With that being said she's been having issues with her toe where she noticed yesterday there was a lot of tissue that was loosing off this appears to be much larger than what it  was previous. She also states that her leg has been read putting much across the top of her foot just about the ankle although this seems to be receiving somewhat. The total area is still red and appears to be someone infected as best I can tell. She is previously taken Bactrim and that may be a good option for her today as well. We are gonna see what I wound culture shows as well and I think that this is definitely appropriate. With that being said outside of the culture I still need to initiate something in the interim and that's what I'm  gonna go ahead and select Bactrim is a good option for her. 12/14/18 on evaluation today patient appears to be doing better in regard to her left great toe ulcer as compared to last week's evaluation. There's still some erythema although this is significantly improved which is excellent news. Overall I do believe that she is making good progress is still gonna take some time before she is where I would like her to be from the standpoint of being able to place her back into the total contact cast. Hopefully we will be where we need to be by next week. 12/21/18 on evaluation today patient actually appears to be doing poorly in regard to her toe ulcer. She's been tolerating the dressing changes without complication. Fortunately there's no signs of systemic infection although she does have a lot of drainage from the toe ulcer and this does seem to be causing some issues at this point. She does have erythema on the distal portion of her toe that appears to be likely cellulitis. 12/28/18 on evaluation today patient actually appears to be doing a little better in my pinion in regard to her toe ulcer. With that being said she still does have some evidence of infection at this time and for her culture she had both E. coli as well as enterococcus as organisms noted on evaluation. For that reason I think that though the Keflex likely has treated the E. coli rather well this has really done nothing for the enterococcus. We are going to have to initiate treatment for this specifically. 01/04/19 on evaluation today patient's toe actually appears to be doing better from the standpoint of infection. She currently would like to see about putting the cash back on I think that this is appropriate as long as she takes care of it and keeps it from getting wet. She is gonna have some drainage we can definitely pass this up with Drawtex and alginate to try to prevent as much drainage as possible from causing the problems. With  that being said I do want to at least try her with the cast between now and Tuesday. If there any issues we can't continue to use it then I will discontinue the use of the cast at that point. Patient History Information obtained from Patient. Social History Current every day smoker. Medical History Eyes Patient has history of Cataracts, Glaucoma, Optic Neuritis Ear/Nose/Mouth/Throat Patient has history of Chronic sinus problems/congestion, Middle ear problems Endocrine Patient has history of Type II Diabetes Review of Systems (ROS) Constitutional Symptoms ([place]) Denies complaints or symptoms of Fever, Chills. Respiratory The patient has no complaints or symptoms. Cardiovascular The patient has no complaints or symptoms. Psychiatric The patient has no complaints or symptoms. Mcmanigal, Tata A. ([id number]) Objective Constitutional Well-nourished and well-hydrated in no acute distress. Vitals Time Taken: 10:14 AM, Height: 69 in, Weight: 240 lbs, BMI: 35.4, Temperature: 97.7  F, Pulse: 86 bpm, Respiratory Rate: 16 breaths/min, Blood Pressure: 101/70 mmHg. Respiratory normal breathing without difficulty. Psychiatric this patient is able to make decisions and demonstrates good insight into disease process. Alert and Oriented x 3. pleasant and cooperative. General Notes: Patient's wound bed currently did require some sharp agreement clear way necrotic tissue and callous from the edge of the wound and the surface of the wound. She tolerated this without any pain or complication unfortunately the pain is better I think this is a good indication that the infection is cleared it also appears to be cleared visually in my pinion. Integumentary (Hair, Skin) Wound #5 status is Open. Original cause of wound was Gradually Appeared. The wound is located on the Left Toe Great. The wound measures 2cm length x 1cm width x 0.3cm depth; 1.571cm^2 area and 0.471cm^3 volume. There is Fat  Layer (Subcutaneous Tissue) Exposed exposed. There is no tunneling or undermining noted. There is a medium amount of serous drainage noted. The wound margin is epibole. There is small (1-33%) pink granulation within the wound bed. There is a medium (34-66%) amount of necrotic tissue within the wound bed including Adherent Slough. The periwound skin appearance exhibited: Callus, Maceration, Erythema. The periwound skin appearance did not exhibit: Crepitus, Excoriation, Induration, Rash, Scarring, Dry/Scaly, Atrophie Blanche, Cyanosis, Ecchymosis, Hemosiderin Staining, Mottled, Pallor, Rubor. The surrounding wound skin color is noted with erythema which is circumferential. Periwound temperature was noted as No Abnormality. Assessment Active Problems ICD-10 Type 2 diabetes mellitus with foot ulcer Non-pressure chronic ulcer of other part of left foot with fat layer exposed Foot drop, left foot Procedures Lakeman, Mikel A. ([id number]) Wound #5 Pre-procedure diagnosis of Wound #5 is a Diabetic Wound/Ulcer of the Lower Extremity located on the Left Toe Great .Severity of Tissue Pre Debridement is: Fat layer exposed. There was a Excisional Skin/Subcutaneous Tissue Debridement with a total area of 2 sq cm performed by STONE III, HOYT E., PA-C. With the following instrument(s): Curette to remove Viable and Non-Viable tissue/material. Material removed includes Callus, Subcutaneous Tissue, and Slough after achieving pain control using Lidocaine 4% Topical Solution. No specimens were taken. A time out was conducted at 10:47, prior to the start of the procedure. A Minimum amount of bleeding was controlled with Pressure. The procedure was tolerated well with a pain level of 0 throughout and a pain level of 0 following the procedure. Post Debridement Measurements: 2cm length x 1cm width x 0.4cm depth; 0.628cm^3 volume. Character of Wound/Ulcer Post Debridement is improved. Severity of Tissue Post  Debridement is: Fat layer exposed. Post procedure Diagnosis Wound #5: Same as Pre-Procedure Pre-procedure diagnosis of Wound #5 is a Diabetic Wound/Ulcer of the Lower Extremity located on the Left Toe Great . There was a Total Contact Cast Procedure by STONE III, HOYT E., PA-C. Post procedure Diagnosis Wound #5: Same as Pre-Procedure Plan Wound Cleansing: Wound #5 Left Toe Great: Clean wound with Normal Saline. Anesthetic (add to Medication List): Wound #5 Left Toe Great: Topical Lidocaine 4% cream applied to wound bed prior to debridement (In Clinic Only). Primary Wound Dressing: Wound #5 Left Toe Great: Silver Alginate - Gentamicin on wound bed Secondary Dressing: Wound #5 Left Toe Great: Foam Drawtex Dressing Change Frequency: Wound #5 Left Toe Great: Change dressing every week Follow-up Appointments: Wound #5 Left Toe Great: Other: - Tuesday 01/08/2019 Off-Loading: Wound #5 Left Toe Great: Total Contact Cast to Left Lower Extremity Medications-please add to medication list.: Wound #5 Left Toe Great: P.O. Antibiotics - [employer]  all antibiotics Topical Antibiotic I'm in a recommend that we go ahead and initiate treatment with the total contact cast she is definitely agreement with that at this point as well. We will subsequently see were things stand at follow-up. If anything changes or worsens in the meantime she will contact the office and let me know. Abair, Lizzie A. ([id number]) Please see above for specific wound care orders. We will see patient for re-evaluation in 1 week(s) here in the clinic. If anything worsens or changes patient will contact our office for additional recommendations. Electronic Signature(s) Signed: 01/04/2019 5:40:27 PM By: Worthy Keeler PA-C Entered By: Worthy Keeler on 01/04/2019 10:55:07 [name] ([id number]) -------------------------------------------------------------------------------- ROS/PFSH Details Patient Name: [name] Date of Service: 01/04/2019 10:00 AM Medical Record Number: [id number] Patient Account Number: [id number] Date of Birth/Sex: [birth year] (43 y.o. F) Treating RN: Montey Hora Primary Care Provider: Priscille Kluver Other Clinician: Referring Provider: Priscille Kluver Treating Provider/Extender: STONE III, HOYT Weeks in Treatment: 21 Information Obtained From Patient Wound History Do you currently have one or more open woundso Yes How many open wounds do you currently haveo 1 Constitutional Symptoms (General Health) Complaints and Symptoms: Negative for: Fever; Chills Eyes Medical History: Positive for: Cataracts; Glaucoma; Optic Neuritis Ear/Nose/Mouth/Throat Medical History: Positive for: Chronic sinus problems/congestion; Middle ear problems Respiratory Complaints and Symptoms: No Complaints or Symptoms Cardiovascular Complaints and Symptoms: No Complaints or Symptoms Endocrine Medical History: Positive for: Type II Diabetes Time with diabetes: 10 years Treated with: Insulin, Oral agents Blood sugar tested every day: No Blood sugar testing results: Lunch: 350 Psychiatric Complaints and Symptoms: No Complaints or Symptoms HBO Extended History Items Kemler, Kaedance A. ([id number]) Eyes: Eyes: Ear/Nose/Mouth/Throat: Ear/Nose/Mouth/Throat: Cataracts Glaucoma Chronic sinus Middle ear problems problems/congestion Immunizations Pneumococcal Vaccine: Received Pneumococcal Vaccination: No Implantable Devices No devices added Family and Social History Current every day smoker Physician Affirmation I have reviewed and agree with the above information. Electronic Signature(s) Signed: 01/04/2019 5:05:27 PM By: Montey Hora Signed: 01/04/2019 5:40:27 PM By: Worthy Keeler PA-C Entered By: Worthy Keeler on 01/04/2019 10:54:33 Minehart, Arlean AMarland Kitchen ([id number]) -------------------------------------------------------------------------------- Total Contact Cast  Details Patient Name: [name] Date of Service: 01/04/2019 10:00 AM Medical Record Number: [id number] Patient Account Number: [id number] Date of Birth/Sex: [birth year] (43 y.o. F) Treating RN: Montey Hora Primary Care Provider: Priscille Kluver Other Clinician: Referring Provider: Priscille Kluver Treating Provider/Extender: Melburn Hake, HOYT Weeks in Treatment: 21 Total Contact Cast Applied for Wound Assessment: Wound #5 Left Toe Great Performed By: Physician Emilio Math., PA-C Post Procedure Diagnosis Same as Pre-procedure Electronic Signature(s) Signed: 01/04/2019 5:05:27 PM By: Montey Hora Signed: 01/04/2019 5:40:27 PM By: Worthy Keeler PA-C Entered By: Montey Hora on 01/04/2019 10:51:17 Selbe, Janeth A. ([id number]) -------------------------------------------------------------------------------- SuperBill Details Patient Name: [name] Date of Service: 01/04/2019 Medical Record Number: [id number] Patient Account Number: [id number] Date of Birth/Sex: 08/23/1976 (43 y.o. F) Treating RN: Montey Hora Primary Care Provider: Priscille Kluver Other Clinician: Referring Provider: Priscille Kluver Treating Provider/Extender: Melburn Hake, HOYT Weeks in Treatment: 21 Diagnosis Coding ICD-10 Codes Code Description E11.621 Type 2 diabetes mellitus with foot ulcer L97.522 Non-pressure chronic ulcer of other part of left foot with fat layer exposed M21.372 Foot drop, left foot Facility Procedures CPT4 Code: [id number] Description: 51884 - DEB SUBQ TISSUE 20 SQ CM/< ICD-10 Diagnosis Description L97.522 Non-pressure chronic ulcer of other part of left foot with fat Modifier: layer exposed Quantity: 1 Physician Procedures CPT4 Code: 1660630 Description: 16010 -  WC PHYS SUBQ TISS 20 SQ CM ICD-10 Diagnosis Description L97.522 Non-pressure chronic ulcer of other part of left foot with fat Modifier: layer exposed Quantity: 1 Electronic Signature(s) Signed: 01/04/2019 5:40:27 PM  By: Worthy Keeler PA-C Entered By: Worthy Keeler on 01/04/2019 10:55:14

## 2019-01-08 ENCOUNTER — Other Ambulatory Visit: Payer: Self-pay

## 2019-01-08 ENCOUNTER — Encounter: Payer: Medicaid Other | Admitting: Physician Assistant

## 2019-01-08 DIAGNOSIS — E11621 Type 2 diabetes mellitus with foot ulcer: Secondary | ICD-10-CM | POA: Diagnosis not present

## 2019-01-09 NOTE — Progress Notes (Signed)
TRAEH, MILROY ([id number]) Visit Report for 01/08/2019 Chief Complaint Document Details Patient Name: [name], [name] A. Date of Service: 01/08/2019 10:00 AM Medical Record Number: [id number] Patient Account Number: [id number] Date of Birth/Sex: [birth year] (43 y.o. F) Treating RN: Montey Hora Primary Care Provider: Priscille Kluver Other Clinician: Referring Provider: Priscille Kluver Treating Provider/Extender: Melburn Hake, HOYT Weeks in Treatment: 22 Information Obtained from: Patient Chief Complaint Left great toe ulcer Electronic Signature(s) Signed: 01/08/2019 5:25:20 PM By: Worthy Keeler PA-C Entered By: Worthy Keeler on 01/08/2019 10:34:17 [name] ([id number]) -------------------------------------------------------------------------------- HPI Details Patient Name: Heather Dear A. Date of Service: 01/08/2019 10:00 AM Medical Record Number: [id number] Patient Account Number: [id number] Date of Birth/Sex: [birth year] (43 y.o. F) Treating RN: Montey Hora Primary Care Provider: Priscille Kluver Other Clinician: Referring Provider: Priscille Kluver Treating Provider/Extender: Melburn Hake, HOYT Weeks in Treatment: 22 History of Present Illness HPI Description: 01/18/18-[name] here for initial evaluation of the left great toe ulcer. [name] a poor historian in regards to timeframe in detail. Heather states approximately 4 weeks ago Heather lacerated her toe on something in the house. Heather followed up with her primary care who placed her on Bactrim and ultimately a second dose of Bactrim prior to coming to wound clinic. Heather states Heather has been treating the toe with peroxide, Betadine and a Band-Aid. Heather did not check her blood sugar this morning but checked it yesterday morning it was 327; [name] unaware of a recent A1c and there are no current records. Heather saw Dr. she would've orthopedics last week for an old injury to the left ankle, Heather states he did not see her toe, nor did Heather bring it to his  attention. Heather smokes approximately 1 pack cigarettes a day. Her social situation [name] concerning, Heather arrives this morning with her mother who appears extremely intoxicated/under the influence; her mother was asked to leave the room and be monitored by the patient's grandmother. The patient's aunt then accompanied the patient and the room throughout the rest of the appointment. We had a lengthy discussion regarding the deleterious effects of uncontrolled hyperglycemia and smoking as it relates to wound healing and overall health. Heather was strongly encouraged to decrease her smoking and get her diabetes under better control. Heather states [name] currently on a diet and has cut down her [facility] consumption. The left toe [name] erythematous, macerated and slightly edematous with malodor present. The edema in her left foot [name] below her baseline, there [name] no erythema streaking. We will treat her with Santyl, doxycycline; we have ordered and xray, culture and provided a Peg assist surgical shoe and cultured the wound. 01/25/18-[name] here in follow-up evaluation for a left great toe ulcer and presents with an abscess to her suprapubic area. Heather states her blood sugars remain elevated, feeling "sick" and if levels are below 250, but [name] trying. Heather has made no attempt to decrease her smoking stating that we "can't take away her food in her cigarettes". Heather has been compliant with offloading using the PEG assist you. [name] using Santyl daily. the culture obtained last week grew staph aureus and Enterococcus faecalis; continues on the doxycycline and Augmentin was added on Monday. The suprapubic area has erythema, no femoral variation, purple discoloration, minimal induration, was accessed with a cotton tip applicator with sanguinopurulent drainage, this was cultured, I suspect the current antibiotic treatment will cover and we will not add anything to her current treatment plan. Heather was advised  to go to urgent  care or ER with any change in redness, induration or fever. 02/01/18-[name] here in follow-up evaluation for left great toe ulcers and a new abdominal abscess from last week. Heather was able to use packing until earlier this week, where Heather "forgot it was there". Heather states Heather was feeling ill with GI symptoms last week and was not taking her antibiotic. Heather states her glucose levels have been predominantly less than 200, with occasional levels between 200-250. Heather thinks this was contributing to her GI symptoms as they have resolved without intervention. There continues to be significant laceration to left toe, otherwise it clinically looks stable/improved. There [name] now less superficial opening to the lateral aspect of the great toe that was residual blister. We will transition to [facility] to all wounds, Heather will continue her Augmentin. If there [name] no change or deterioration next week for reculture. 02/08/18-[name] here in follow-up evaluation for left great toe ulcer and abdominal ulcer. There [name] an improvement in both wounds. Heather has been wrapping her left toe with coban, not by our direction, which has created an area of discoloration to the medial aspect; Heather has been advised to NOT use coban secondary to her neuropathy. Heather states her glucose levels have been high over this last week ranging from 200-350, Heather continues to smoke. Heather admits to being less compliant with her offloading shoe. We will continue with same treatment plan and Heather will follow-up next week. 02/15/18-[name] here in follow-up evaluation for left great toe ulcer and abdominal ulcer. The abdominal ulcer [name] epithelialized. The left great toe ulcer [name] improved and all injury from last week using the Coban wrap [name] resolved, the lateral ulcer [name] healed. Heather admits to noncompliance with wearing offloading shoe and admits to glucose levels being greater than 300 most of the week. Heather continues to smoke and expresses no desire to  quit. There [name] one area medially that probes deeper than it has historically, erythema to the toe and dorsal foot has consistently waxed and waned. There [name] no overt signs of cellulitis or infection but we will culture the wound for any occult infection given the new area of depth and erythema. We will hold off on sensitivities for initiation of antibiotic therapy. 02/22/18-[name] here in follow up evaluation for left great toe ulcer. There [name] overall significant improvement in both wound appearance, erythema and edema with changes made last week. Heather was not initiated on antibiotic therapy. Culture obtained last week showed oxacillin sensitive staph aureus, sensitive to clindamycin. Clindamycin has been called into the pharmacy but Heather has been instructed to hold off on initiation secondary to overall clinical improvement and her history of antibiotic intolerance. Heather has been instructed to contact the clinic with any noted changes/deterioration and the wound, erythema, Leduc, Triston A. ([id number]) edema and/or pain. Heather will follow-up next week. Heather continues to smoke and her glucose levels remain elevated >250; Heather admits to compliance with offloading shoe 03/01/18 on evaluation today patient appears to be doing fairly well in regard to her left first toe ulcer. Heather has been tolerating the dressing changes with the [facility] Dressing without complication and overall this has definitely showed signs of improvement according to records as well [name] what the patient tells me today. I'm very pleased in that regard. [name] having no pain today 03/08/18 [name] here for follow up evaluation of a left great toe ulcer. Heather remains non-compliant with glucose control  and smoking cessation; glucose levels consistently >200. Heather states that Heather got new shoe inserts/peg assist. Heather admits to compliance with offloading. Since my last evaluation there [name] significant improvement. We will switch to prisma at this  time and Heather will follow up next week. [name] noted to be tachycardic at this appointment, heart rate 120s; Heather has a history of heart rate 70-130 according to our records. Heather admits to extreme agitation r/t personal issues; Heather was advised to monitor her heartrate and contact her physician if it does not return to a more normal range (<100). Heather takes cardizem twice daily. 03/15/18-[name] here in follow-up evaluation for left great toe ulcer. Heather remains noncompliant with glucose control and smoking cessation. Heather admits to compliance with wearing offloading shoe. The ulcer [name] improved/stable and we will continue with the same treatment plan and Heather will follow-up next week 03/22/18-[name] here for evaluation for left great toe ulcer. There continues to be significant improvement despite recurrent hyperglycemia (over 500 yesterday) and Heather continues to smoke. Heather has been compliant with offloading and we will continue with same treatment plan and Heather will follow-up next week. 03/29/18-[name] here for evaluation for left great toe ulcer. Despite continuing to smoke and uncontrolled diabetes Heather continues to improve. [name] compliant with offloading shoe. We will continue with the same treatment plan and Heather will follow-up next week 04/05/18- [name] here in follow up evaluation for a left great toe ulcer; Heather presents with small pustule to left fifth toe (resembles ant bite). Heather admits to compliance with wearing offloading shoe; continues to smoke or have uncontrolled blood glucose control. There [name] more callus than usual with evidence of bleeding; Heather denies known trauma. 04/12/18-[name] here for evaluation of left great toe ulcer. Despite noncompliance with glycemic control and smoking Heather continues to make improvement. Heather continues to wear offloading shoe. The pustule, that was identified last week, to the left fifth toe [name] resolved. Heather will follow-up in 2 weeks 05/03/18-[name] seen in follow-up  evaluation for a left great toe ulcer. [name] compliant with offloading, otherwise noncompliant with glycemic control and smoking. Heather has plateaued and there [name] minimal improvement noted. We will transition to [facility], replaced the insert to her surgical shoe and Heather will follow-up in one week 05/10/18- [name] here in follow up evaluation for a left great toe ulcer. It appears stable despite measurement change. We will continue with same treatment plan and follow up next week. 05/24/18-[name] seen in follow-up evaluation for a left great toe ulcer. Heather remains compliant with offloading, has made significant improvement in her diet, decreasing the amount of sugar/soda. Heather said her recent A1c was 10.9 which [name] lower than. Heather did see a diabetic nutritionist/educator yesterday. Heather continues to smoke. We will continue with the same treatment plan and Heather'll follow-up next week. 05/31/18- [name] seen in follow-up evaluation for left great toe ulcer. Heather continues to remain compliant with offloading, continues to make improvement in her diet, increasing her water and decreasing the amount of sugar/soda. Heather does continue to smoke with no desire to quit. We will apply Prisma to the depth and Hydrofera Blue over. We have not received insurance authorization for oasis. Heather will follow up next week. 06/07/18-[name] seen in follow-up evaluation for left great toe ulcer. It has stalled according to today's measurements although base appears stable. Heather says Heather saw a diabetic educator yesterday; her average blood sugars are  less than 300 which [name] an improvement for her. Heather continues to smoke and states "that's my next step" Heather continues with water over soda. We will order for xray, culture and reinstate ace wrap compression prior to placing apligraf for next week. [name] voicing no complaints or concerns. Her dressing will change to iodoflex over the next week in preparation for apligraf. 06/14/18-[name]  seen in follow-up evaluation for left great toe ulcer. Plain film x-ray performed last week was negative for osteomyelitis. Wound culture obtained last week grew strep B and OSSA; [name] initiated on keflex and cefdinir today; there [name] erythema to the toe which could be from ace wrap compression, Heather has a history of wrapping too tight and has has been encouraged to maintain ace wraps that we place today. We will hold off on application of apligraf today, will apply next week after antibiotic therapy has been initiated. Heather admits today that Heather has resumed taking a shower with her foot/toe submerged in water, Heather has been reminded to keep foot/toe out of the bath water. Heather will be seen in follow up next week 06/21/18-[name] seen in follow-up evaluation for left great toe ulcer. [name] tolerating antibiotic therapy with no GI disturbance. The wound [name] stable. Apligraf was applied today. Heather has been decreasing her smoking, only had 4 cigarettes yesterday and 1 today. Heather continues being more compliant in diabetic diet. Heather will follow-up next week for evaluation of site, if stable will remove at 2 weeks. 06/28/18- [name] here in follow up evalution. Apligraf was placed last week, Heather states the dressing fell off on Tuesday and Heather was dressing with hydrofera blue. [name] healed and will be discharged from the clinic today. Heather has been instructed to continue with smoking cessation, continue monitoring glucose levels, offloading for an additional 4 weeks and continue with Mcbrien, Malaisha A. ([id number]) hydrofera blue for additional two weeks for any possible microscopic opening. Readmission: 08/07/18 on evaluation today patient presents for reevaluation concerning the ulcer of her right great toe. Heather was previously discharged on 06/28/18 healed. Nonetheless Heather states that this began to show signs of drainage Heather subsequently went to her primary care provider. Subsequently an x-ray was performed on  08/01/18 which was negative. The patient was also placed on antibiotics at that time. Fortunately they should have been effective for the infection. Nonetheless Heather's been experiencing some improvement but still has a lot of drainage coming from the wound itself. 08/14/18 on evaluation today patient's wound actually does show signs of improvement in regard to the erythema at this point. Heather has completed the antibiotics. With that being said we did discuss the possibility of placing her in a total contact cast as of today although I think that I may want to give this just a little bit more time to ensure nothing recurrence as far as her infection [name] concerned. I do not want to put in the cast and risk infection at that time if things are not completely resolved. With that being said [name] gonna require some debridement today. 08/21/18 on evaluation today patient actually appears to be doing okay in regard to her toe ulcer. Heather's been tolerating the dressing changes without complication. With that being said it does appear that [name] ready and in fact I think it's appropriate for [place] to go ahead and initiate the total contact cast today. Nonetheless Heather will require some sharp debridement to prepare the wound for application. Overall  I feel like things have been progressing well but we do need to do something to get this to close more readily. 08/24/18 patient seen today for reevaluation after having had the total contact cast applied on Tuesday. Heather seems to have done very well the wound appears to be doing great and overall I'm pleased with the progress that Heather's made. There were no abnormal areas of rubbing from the cast on her lower extremity. 08/30/18 on evaluation today patient actually appears to be completely healed in regard to her plantar toe ulcer. Heather tells me at this point Heather's been having a lot of issues with the cast. Heather almost fell a couple of times the state shall the step of her  dog a couple times as well. This [name] been a very frustrating process for her other nonetheless Heather has completely healed the wound which [name] excellent news. Overall there does not appear to be the evidence of infection at this time which [name] great news. 09/11/18 evaluation today patient presents for follow-up concerning her great toe ulcer on the left which has unfortunately reopened since I last saw her which was only a couple of weeks ago. Unfortunately Heather was not able to get in to get the shoe and potentially the AFO that's gonna be necessary due to her left foot drop. Heather continues with offloading shoe but this [name] not enough to prevent her from reopening it appears. When we last had her in the total contact cast Heather did well from a healing standpoint but unfortunately the wound reopened as soon as Heather came out of the cast within just a couple of weeks. 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 This [name] also part of the reason why Heather falls on a regular basis. I just do not believe that [name] gonna be safe for [place] to reinitiate the total contact cast as last time we had this on Heather fell 3 times one week which [name] definitely not normal for her. 09/18/18 upon evaluation today the patient actually appears to be doing about the same in regard to her toe ulcer. Heather did not contact [facility] as I asked her to even though I had given her the prescription. In fact Heather actually states that Heather has no idea where the prescription [name]. Heather did apparently call [facility] and they told her that all Heather needed to do was bring the prescription in order to be able to be seen and work on getting the AFO for her left foot. With all that being said Heather still does not have an appointment and I'm not sure were things stand that regard.  I will give her a new prescription today in order to contact them to get this set up. 09/25/18 on evaluation today patient actually appears to be doing about the same in regard to her toes ulcer. Heather does have a small areas which seems to have a lot of callous buildup around the edge of the wound which [name] going to need sharp debridement today. Heather still [name] waiting to be scheduled for evaluation with [facility] for possibility of an AFO. Heather states there supposed to call her tomorrow to get this set up. Unfortunately it does appear that her foot specifically the toe area [name] showing signs  of erythema. There does not appear to be any systemic infection which [name] in these good news. 10/02/18 on evaluation today patient actually appears to be doing about the same in regard to her toe ulcer. This really has not done too well although it's not significantly larger it's also not significantly smaller. Heather has been tolerating the dressing changes without complication. Heather actually has her appointment with [facility] and [employer] tomorrow to hopefully be measured for obtaining and AFO splint. I think this would be helpful preventing this from reoccurring. We had contemplated starting the cast this week although to be honest I am reluctant to do that as Heather's been having nausea, vomiting, and seizure activity over the past three days. Heather has a history of seizures and have been told [name] nothing that can be done for these. With [name], [name] ([id number]) that being said I do believe that along with the seizures have the nausea vomiting which upon further questioning doesn't seem to be the normal for her and makes me concerned for the possibility of infection or something else going on. I discussed this with the patient and her mother during the office visit today. I do not feel the wound [name] effective but maybe something else. The responses this was "this just happens to her at times and we don't know why". They did  not seem to be interested in going to the hospital to have this checked out further. 10/09/18 on evaluation today patient presents for follow-up concerning her ongoing toe ulcer. Heather has been tolerating the dressing changes without complication. Fortunately there does not appear to be any evidence of infection which [name] great news however I do think that the patient would benefit from going ahead for with the total contact cast. Heather's actually in a wheelchair today Heather tells me that Heather will use her walker if we initiate the cast. I was very specific about the fact that if we were gonna do the cast I wanted to make sure that Heather was using the walker in order to prevent any falls. Heather tells me Heather does not have stairs that Heather has to traverse on a regular basis at her home. Heather has not had any seizures since last week again that something that happens to her often Heather tells me Heather did talk to [employer] and they said that it may take up to three weeks to get the brace approved for her. Hopefully that will not take that long but nonetheless in the meantime I do think the cast could be of benefit. 10/12/18 on evaluation today patient appears to be doing rather well in regard to her toe ulcer. It's just been a few days and already this [name] significantly improved both as far as overall appearance and size. Fortunately there's no sign of infection. [name] here for her first obligatory cast change. 10/19/18 Seen today for follow up and management of left great toe ulcer. Wound continues to show improvement. Noted small open area with seroussang drainage with palpation. Denies any increased pain or recent fevers during visit. Heather will continue calcium alginate with offloading shoe. Denies any questions or concerns during visit. 10/26/18 on evaluation today patient appears to be doing about the same as when I last saw her in regard to her wound bed. Fortunately there does not appear to be any signs of infection.  Unfortunately Heather continues to have a breakdown in regard to the toe region any time that [name] not in the cast.  It takes almost no time at all for this to happen. Nonetheless Heather still has not heard anything from the brace being made by [employer] as to when exactly this will be available to her. Fortunately there [name] no signs of infection at this time. 10/30/18 on evaluation today patient presents for application of the total contact cast as we just received him this morning. Fortunately we are gonna be able to apply this to her today which [name] great news. Heather continues to have no significant pain which [name] good news. Overall I do feel like things have been improving while Heather was the cast [name] when Heather doesn't have a cast that things get worse. Heather still has not really heard anything from [facility] regarding her brace. 11/02/18 upon evaluation today patient's wound already appears to be doing significantly better which [name] good news. Fortunately there does not appear to be any signs of infection also good news. Overall I do think the total contact cast as before [name] helping to heal this area unfortunately it's just not gonna likely keep the area closed and healed without her getting her brace at least. Again the foot drop [name] a significant issue for her. 11/09/18 on evaluation today patient appears to be doing excellent in regard to her toe ulcer which in fact [name] completely healed. Fortunately we finally got the situation squared away with the paperwork which was needed to proceed with getting her brace approved by Medicaid. I have filled that out unfortunately that information has been sent to the orthopedic office that I worked at 2 1/2 years ago and not tired Current wound care measures. Fortunately Heather seems to be doing very well at this time. 11/23/18 on evaluation today patient appears to be doing More Poorly Compared to Last Time I Saw Her. At [facility] Heather Had Completely Healed. Currently [name]  continuing to have issues with reopening. Heather states that Heather just found out that the brace was approved through Medicaid now Heather just has to go get measured in order to have this fitted for her and then made. Subsequently Heather does not have an appointment for this yet that [name] going to complicate things we obviously cannot put her back in the cast if we do not have everything measured because they're not gonna be able to measure her foot while [name] in the cast. Unfortunately the other thing that I found out today as well [name] that Heather was in the hospital over the weekend due to having a heroin overdose. Obviously this [name] unfortunate and does have me somewhat worried as well. 11/30/18 on evaluation today patient's toe ulcer actually appears to be doing fairly well. The good news [name] Heather will be getting her brace in the shoes next week on Wednesday. Hopefully we will be able to get this to heal without having to go back in the cast however Heather may need the cast in order to get the wound completely heal and then go from there. Fortunately there's no signs of infection at this time. 12/07/18 on evaluation today patient fortunately did receive her brace and Heather states Heather could tell this definitely makes her walk better. With that being said Heather's been having issues with her toe where Heather noticed yesterday there was a lot of tissue Scatena, Kailoni A. ([id number]) that was loosing off this appears to be much larger than what it was previous. Heather also states that her leg has been read putting much across the top of  her foot just about the ankle although this seems to be receiving somewhat. The total area [name] still red and appears to be someone infected as best I can tell. [name] previously taken Bactrim and that may be a good option for her today as well. We are gonna see what I wound culture shows as well and I think that this [name] definitely appropriate. With that being said outside of the culture I still need to  initiate something in the interim and that's what I'm gonna go ahead and select Bactrim [name] a good option for her. 12/14/18 on evaluation today patient appears to be doing better in regard to her left great toe ulcer as compared to last week's evaluation. There's still some erythema although this [name] significantly improved which [name] excellent news. Overall I do believe that [name] making good progress [name] still gonna take some time before [name] where I would like her to be from the standpoint of being able to place her back into the total contact cast. Hopefully we will be where we need to be by next week. 12/21/18 on evaluation today patient actually appears to be doing poorly in regard to her toe ulcer. Heather's been tolerating the dressing changes without complication. Fortunately there's no signs of systemic infection although Heather does have a lot of drainage from the toe ulcer and this does seem to be causing some issues at this point. Heather does have erythema on the distal portion of her toe that appears to be likely cellulitis. 12/28/18 on evaluation today patient actually appears to be doing a little better in my pinion in regard to her toe ulcer. With that being said Heather still does have some evidence of infection at this time and for her culture Heather had both E. coli as well as enterococcus as organisms noted on evaluation. For that reason I think that though the Keflex likely has treated the E. coli rather well this has really done nothing for the enterococcus. We are going to have to initiate treatment for this specifically. 01/04/19 on evaluation today patient's toe actually appears to be doing better from the standpoint of infection. Heather currently would like to see about putting the cash back on I think that this [name] appropriate as long as Heather takes care of it and keeps it from getting wet. [name] gonna have some drainage we can definitely pass this up with Drawtex and alginate to try to prevent as  much drainage as possible from causing the problems. With that being said I do want to at least try her with the cast between now and Tuesday. If there any issues we can't continue to use it then I will discontinue the use of the cast at that point. 01/08/19 on evaluation today patient actually appears to be doing very well as far as her foot ulcer specifically the great toe on the left [name] concerned. Heather did have an area of rubbing on the medial aspect of her left ankle which again [name] from the cast. Fortunately there's no signs of infection at this point in this appears to be a very slight skin breakdown. The patient tells me Heather felt it rubbing but didn't think it was that bad. Fortunately there [name] no signs of active infection at this time which [name] good news. No fevers, chills, nausea, or vomiting noted at this time. Electronic Signature(s) Signed: 01/08/2019 5:25:20 PM By: Worthy Keeler PA-C Entered By: Worthy Keeler on 01/08/2019 11:58:40  VAEDA, WESTALL ([id number]) -------------------------------------------------------------------------------- Physical Exam Details Patient Name: Scripter, Merit A. Date of Service: 01/08/2019 10:00 AM Medical Record Number: [id number] Patient Account Number: [id number] Date of Birth/Sex: 1975-11-12 (43 y.o. F) Treating RN: Montey Hora Primary Care Provider: Priscille Kluver Other Clinician: Referring Provider: Priscille Kluver Treating Provider/Extender: STONE III, HOYT Weeks in Treatment: 20 Constitutional Well-nourished and well-hydrated in no acute distress. Respiratory normal breathing without difficulty. clear to auscultation bilaterally. Cardiovascular regular rate and rhythm with normal S1, S2. Psychiatric this patient [name] able to make decisions and demonstrates good insight into disease process. Alert and Oriented x 3. pleasant and cooperative. Notes Upon inspection today patient's wound bed shows evidence of good granulation at this time. This  [name] obviously excellent news. Heather also has good epithelialization around the edge of the wound wrongfully Heather's made good progress. The rubbing on the medial aspect of wrinkle actually can be managed with extra padding. This was done today before reapplication of the total contact cast and the patient stated that it felt good post application. Electronic Signature(s) Signed: 01/08/2019 5:25:20 PM By: Worthy Keeler PA-C Entered By: Worthy Keeler on 01/08/2019 12:56:19 [name] ([id number]) -------------------------------------------------------------------------------- Physician Orders Details Patient Name: Heather Dear A. Date of Service: 01/08/2019 10:00 AM Medical Record Number: [id number] Patient Account Number: [id number] Date of Birth/Sex: Dec 22, 1975 (43 y.o. 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 Wound #6 Left,Medial Malleolus o Clean wound with Normal Saline. Anesthetic (add to Medication List) Wound #5 Left Toe Great o Topical Lidocaine 4% cream applied to wound bed prior to debridement (In Clinic Only). Primary Wound Dressing Wound #5 Left Toe Great o Silver Alginate - Gentamicin on wound bed Secondary Dressing Wound #5 Left Toe Great o Foam o Drawtex Wound #6 Left,Medial Malleolus o Foam o Drawtex Dressing Change Frequency Wound #5 Left Toe Great o Change dressing every week Wound #6 Left,Medial Malleolus o Change dressing every week Follow-up Appointments Wound #5 Left Toe  Great o Return Appointment in 1 week. Wound #6 Left,Medial Malleolus [name], [name] ([id number]) o Return Appointment in 1 week. Off-Loading Wound #5 Left Toe Great o Total Contact Cast to Left Lower Extremity Wound #6 Left,Medial Malleolus o Total Contact Cast to Left Lower Extremity Medications-please add to medication list. Wound #5 Left Toe Great o Topical Antibiotic Electronic Signature(s) Signed: 01/08/2019 4:44:55 PM By: Montey Hora Signed: 01/08/2019 5:25:20 PM By: Worthy Keeler PA-C Entered By: Montey Hora on 01/08/2019 10:39:44 Mars, Rache A. ([id number]) -------------------------------------------------------------------------------- Problem List Details Patient Name: Heather Dear A. Date of Service: 01/08/2019 10:00 AM Medical Record Number: [id number] Patient Account Number: [id number] Date of Birth/Sex: Jun 12, 1976 (43 y.o. 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 Date of Service: 01/08/2019 10:00 AM Medical Record Number: [id number] Patient Account Number:  [id number] Date of Birth/Sex: 08-31-1976 (43 y.o. F) Treating RN: Montey Hora Primary Care Provider: Priscille Kluver Other Clinician: Referring Provider: Priscille Kluver Treating Provider/Extender: Melburn Hake, HOYT Weeks in Treatment: 22 Subjective Chief Complaint Information obtained from Patient Left great toe ulcer History of Present Illness (HPI) 01/18/18-[name] here for initial evaluation of the left great toe ulcer. [name] a poor historian in regards to timeframe in detail. Heather states approximately 4 weeks ago Heather lacerated her toe on something in the house. Heather followed up with her primary care who placed her on Bactrim and ultimately a second dose of Bactrim prior to coming to wound clinic. Heather states Heather has been treating the toe with peroxide, Betadine and a Band-Aid. Heather did not check her blood sugar this morning but checked it yesterday morning it was 327; [name] unaware of a recent A1c and there are no current records. Heather saw Dr. she would've orthopedics last week for an old injury to the left ankle, Heather states he did not see her toe, nor did Heather bring it to his attention. Heather smokes approximately 1 pack cigarettes a day. Her social situation [name] concerning, Heather arrives this morning with her mother who appears extremely intoxicated/under the influence; her mother was asked to leave the room and be monitored by the patient's grandmother. The patient's aunt then accompanied the patient and the room throughout the rest of the appointment. We had a lengthy discussion regarding the deleterious effects of uncontrolled hyperglycemia and smoking as it relates to wound healing and overall health. Heather was strongly encouraged to decrease her smoking and get her diabetes under better control. Heather states [name] currently on a diet and has cut down her [facility] consumption. The left toe [name] erythematous, macerated and slightly edematous with malodor present. The edema in her left foot [name] below her  baseline, there [name] no erythema streaking. We will treat her with Santyl, doxycycline; we have ordered and xray, culture and provided a Peg assist surgical shoe and cultured the wound. 01/25/18-[name] here in follow-up evaluation for a left great toe ulcer and presents with an abscess to her suprapubic area. Heather states her blood sugars remain elevated, feeling "sick" and if levels are below 250, but [name] trying. Heather has made no attempt to decrease her smoking stating that we "can't take away her food in her cigarettes". Heather has been compliant with offloading using the PEG assist you. [name] using Santyl daily. the culture obtained last week grew staph aureus and Enterococcus faecalis; continues on the doxycycline and Augmentin was added on Monday. The suprapubic area has erythema, no femoral variation, purple discoloration, minimal induration, was accessed with a cotton tip applicator with sanguinopurulent drainage, this was cultured, I suspect the current antibiotic treatment will cover and we will not add anything to her current treatment plan. Heather was advised to go to urgent care or ER with any change in redness, induration or fever. 02/01/18-[name] here in follow-up evaluation for left great toe ulcers and a new abdominal abscess from last week. Heather was able to use packing until earlier this week, where  Heather "forgot it was there". Heather states Heather was feeling ill with GI symptoms last week and was not taking her antibiotic. Heather states her glucose levels have been predominantly less than 200, with occasional levels between 200-250. Heather thinks this was contributing to her GI symptoms as they have resolved without intervention. There continues to be significant laceration to left toe, otherwise it clinically looks stable/improved. There [name] now less superficial opening to the lateral aspect of the great toe that was residual blister. We will transition to [facility] to all wounds, Heather will continue  her Augmentin. If there [name] no change or deterioration next week for reculture. 02/08/18-[name] here in follow-up evaluation for left great toe ulcer and abdominal ulcer. There [name] an improvement in both wounds. Heather has been wrapping her left toe with coban, not by our direction, which has created an area of discoloration to the medial aspect; Heather has been advised to NOT use coban secondary to her neuropathy. Heather states her glucose levels have been high over this last week ranging from 200-350, Heather continues to smoke. Heather admits to being less compliant with her offloading shoe. We will continue with same treatment plan and Heather will follow-up next week. 02/15/18-[name] here in follow-up evaluation for left great toe ulcer and abdominal ulcer. The abdominal ulcer [name] epithelialized. The left great toe ulcer [name] improved and all injury from last week using the Coban wrap [name] resolved, the lateral ulcer [name] healed. Heather admits to noncompliance with wearing offloading shoe and admits to glucose levels being greater than 300 most of the week. Heather continues to smoke and expresses no desire to quit. There [name] one area medially that probes deeper than it has historically, erythema to the toe and dorsal foot has consistently waxed and waned. There [name] no overt signs of cellulitis or infection but we will culture the wound for any occult infection given the new area of depth and erythema. We will hold off on Tetrault, [name] ([id number]) sensitivities for initiation of antibiotic therapy. 02/22/18-[name] here in follow up evaluation for left great toe ulcer. There [name] overall significant improvement in both wound appearance, erythema and edema with changes made last week. Heather was not initiated on antibiotic therapy. Culture obtained last week showed oxacillin sensitive staph aureus, sensitive to clindamycin. Clindamycin has been called into the pharmacy but Heather has been instructed to hold off on initiation secondary to  overall clinical improvement and her history of antibiotic intolerance. Heather has been instructed to contact the clinic with any noted changes/deterioration and the wound, erythema, edema and/or pain. Heather will follow-up next week. Heather continues to smoke and her glucose levels remain elevated >250; Heather admits to compliance with offloading shoe 03/01/18 on evaluation today patient appears to be doing fairly well in regard to her left first toe ulcer. Heather has been tolerating the dressing changes with the [employer] without complication and overall this has definitely showed signs of improvement according to records as well [name] what the patient tells me today. I'm very pleased in that regard. [name] having no pain today 03/08/18 [name] here for follow up evaluation of a left great toe ulcer. Heather remains non-compliant with glucose control and smoking cessation; glucose levels consistently >200. Heather states that Heather got new shoe inserts/peg assist. Heather admits to compliance with offloading. Since my last evaluation there [name] significant improvement. We will switch to prisma at this time and Heather will follow up next week.  [name] noted to be tachycardic at this appointment, heart rate 120s; Heather has a history of heart rate 70-130 according to our records. Heather admits to extreme agitation r/t personal issues; Heather was advised to monitor her heartrate and contact her physician if it does not return to a more normal range (<100). Heather takes cardizem twice daily. 03/15/18-[name] here in follow-up evaluation for left great toe ulcer. Heather remains noncompliant with glucose control and smoking cessation. Heather admits to compliance with wearing offloading shoe. The ulcer [name] improved/stable and we will continue with the same treatment plan and Heather will follow-up next week 03/22/18-[name] here for evaluation for left great toe ulcer. There continues to be significant improvement despite recurrent hyperglycemia (over 500  yesterday) and Heather continues to smoke. Heather has been compliant with offloading and we will continue with same treatment plan and Heather will follow-up next week. 03/29/18-[name] here for evaluation for left great toe ulcer. Despite continuing to smoke and uncontrolled diabetes Heather continues to improve. [name] compliant with offloading shoe. We will continue with the same treatment plan and Heather will follow-up next week 04/05/18- [name] here in follow up evaluation for a left great toe ulcer; Heather presents with small pustule to left fifth toe (resembles ant bite). Heather admits to compliance with wearing offloading shoe; continues to smoke or have uncontrolled blood glucose control. There [name] more callus than usual with evidence of bleeding; Heather denies known trauma. 04/12/18-[name] here for evaluation of left great toe ulcer. Despite noncompliance with glycemic control and smoking Heather continues to make improvement. Heather continues to wear offloading shoe. The pustule, that was identified last week, to the left fifth toe [name] resolved. Heather will follow-up in 2 weeks 05/03/18-[name] seen in follow-up evaluation for a left great toe ulcer. [name] compliant with offloading, otherwise noncompliant with glycemic control and smoking. Heather has plateaued and there [name] minimal improvement noted. We will transition to [facility], replaced the insert to her surgical shoe and Heather will follow-up in one week 05/10/18- [name] here in follow up evaluation for a left great toe ulcer. It appears stable despite measurement change. We will continue with same treatment plan and follow up next week. 05/24/18-[name] seen in follow-up evaluation for a left great toe ulcer. Heather remains compliant with offloading, has made significant improvement in her diet, decreasing the amount of sugar/soda. Heather said her recent A1c was 10.9 which [name] lower than. Heather did see a diabetic nutritionist/educator yesterday. Heather continues to smoke. We will continue  with the same treatment plan and Heather'll follow-up next week. 05/31/18- [name] seen in follow-up evaluation for left great toe ulcer. Heather continues to remain compliant with offloading, continues to make improvement in her diet, increasing her water and decreasing the amount of sugar/soda. Heather does continue to smoke with no desire to quit. We will apply Prisma to the depth and Hydrofera Blue over. We have not received insurance authorization for oasis. Heather will follow up next week. 06/07/18-[name] seen in follow-up evaluation for left great toe ulcer. It has stalled according to today's measurements although base appears stable. Heather says Heather saw a diabetic educator yesterday; her average blood sugars are less than 300 which [name] an improvement for her. Heather continues to smoke and states "that's my next step" Heather continues with water over soda. We will order for xray, culture and reinstate ace wrap compression prior to placing apligraf for next week. [name]  voicing no complaints or concerns. Her dressing will change to iodoflex over the next week in preparation for apligraf. 06/14/18-[name] seen in follow-up evaluation for left great toe ulcer. Plain film x-ray performed last week was negative for osteomyelitis. Wound culture obtained last week grew strep B and OSSA; [name] initiated on keflex and cefdinir today; there [name] erythema to the toe which could be from ace wrap compression, Heather has a history of wrapping too tight and has has been encouraged to maintain ace wraps that we place today. We will hold off on application of apligraf today, will apply next week after antibiotic therapy has been initiated. Heather admits today that Heather has resumed taking a shower with her foot/toe submerged in water, Heather has been reminded to keep foot/toe out of the bath water. Heather will be seen in follow up next week 06/21/18-[name] seen in follow-up evaluation for left great toe ulcer. [name] tolerating antibiotic therapy with no  GI disturbance. The wound [name] stable. Apligraf was applied today. Heather has been decreasing her smoking, only had 4 cigarettes yesterday and [name], [name] ([id number]) 1 today. Heather continues being more compliant in diabetic diet. Heather will follow-up next week for evaluation of site, if stable will remove at 2 weeks. 06/28/18- [name] here in follow up evalution. Apligraf was placed last week, Heather states the dressing fell off on Tuesday and Heather was dressing with hydrofera blue. [name] healed and will be discharged from the clinic today. Heather has been instructed to continue with smoking cessation, continue monitoring glucose levels, offloading for an additional 4 weeks and continue with hydrofera blue for additional two weeks for any possible microscopic opening. Readmission: 08/07/18 on evaluation today patient presents for reevaluation concerning the ulcer of her right great toe. Heather was previously discharged on 06/28/18 healed. Nonetheless Heather states that this began to show signs of drainage Heather subsequently went to her primary care provider. Subsequently an x-ray was performed on 08/01/18 which was negative. The patient was also placed on antibiotics at that time. Fortunately they should have been effective for the infection. Nonetheless Heather's been experiencing some improvement but still has a lot of drainage coming from the wound itself. 08/14/18 on evaluation today patient's wound actually does show signs of improvement in regard to the erythema at this point. Heather has completed the antibiotics. With that being said we did discuss the possibility of placing her in a total contact cast as of today although I think that I may want to give this just a little bit more time to ensure nothing recurrence as far as her infection [name] concerned. I do not want to put in the cast and risk infection at that time if things are not completely resolved. With that being said [name] gonna require some debridement  today. 08/21/18 on evaluation today patient actually appears to be doing okay in regard to her toe ulcer. Heather's been tolerating the dressing changes without complication. With that being said it does appear that [name] ready and in fact I think it's appropriate for [place] to go ahead and initiate the total contact cast today. Nonetheless Heather will require some sharp debridement to prepare the wound for application. Overall I feel like things have been progressing well but we do need to do something to get this to close more readily. 08/24/18 patient seen today for reevaluation after having had the total contact cast applied on Tuesday. Heather seems to have done very well  the wound appears to be doing great and overall I'm pleased with the progress that Heather's made. There were no abnormal areas of rubbing from the cast on her lower extremity. 08/30/18 on evaluation today patient actually appears to be completely healed in regard to her plantar toe ulcer. Heather tells me at this point Heather's been having a lot of issues with the cast. Heather almost fell a couple of times the state shall the step of her dog a couple times as well. This [name] been a very frustrating process for her other nonetheless Heather has completely healed the wound which [name] excellent news. Overall there does not appear to be the evidence of infection at this time which [name] great news. 09/11/18 evaluation today patient presents for follow-up concerning her great toe ulcer on the left which has unfortunately reopened since I last saw her which was only a couple of weeks ago. Unfortunately Heather was not able to get in to get the shoe and potentially the AFO that's gonna be necessary due to her left foot drop. Heather continues with offloading shoe but this [name] not enough to prevent her from reopening it appears. When we last had her in the total contact cast Heather did well from a healing standpoint but unfortunately the wound reopened as soon as Heather came out of the  cast within just a couple of weeks. 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 This [name] also part of the reason why Heather falls on a regular basis. I just do not believe that [name] gonna be safe for [place] to reinitiate the total contact cast as last time we had this on Heather fell 3 times one week which [name] definitely not normal for her. 09/18/18 upon evaluation today the patient actually appears to be doing about the same in regard to her toe ulcer. Heather did not contact [facility] as I asked her to even though I had given her the prescription. In fact Heather actually states that Heather has no idea where the prescription [name]. Heather did apparently call [facility] and they told her that all Heather needed to do was bring the prescription in order to be able to be seen and work on getting the AFO for her left foot. With all that being said Heather still does not have an appointment and I'm not sure were things stand that regard. I will give her a new prescription today in order to contact them to get this set up. 09/25/18 on evaluation today patient actually appears to be doing about the same in regard to her toes ulcer. Heather does have a small areas which seems to have a lot of callous buildup around the edge of the wound which [name] going to need sharp debridement today. Heather still [name] waiting to be scheduled for evaluation with [facility] for possibility of an AFO. Heather states there supposed to call her tomorrow to get this set up. Unfortunately it does appear that her foot specifically the toe area [name] showing signs of erythema. There does not appear to be any systemic infection which [name] in these good news. KENSLEIGH, GATES A. ([id number]) 10/02/18 on evaluation today patient actually appears to be doing about the same in regard to her toe ulcer. This  really has not done  too well although it's not significantly larger it's also not significantly smaller. Heather has been tolerating the dressing changes without complication. Heather actually has her appointment with [facility] and [facility] tomorrow to hopefully be measured for obtaining and AFO splint. I think this would be helpful preventing this from reoccurring. We had contemplated starting the cast this week although to be honest I am reluctant to do that as Heather's been having nausea, vomiting, and seizure activity over the past three days. Heather has a history of seizures and have been told [name] nothing that can be done for these. With that being said I do believe that along with the seizures have the nausea vomiting which upon further questioning doesn't seem to be the normal for her and makes me concerned for the possibility of infection or something else going on. I discussed this with the patient and her mother during the office visit today. I do not feel the wound [name] effective but maybe something else. The responses this was "this just happens to her at times and we don't know why". They did not seem to be interested in going to the hospital to have this checked out further. 10/09/18 on evaluation today patient presents for follow-up concerning her ongoing toe ulcer. Heather has been tolerating the dressing changes without complication. Fortunately there does not appear to be any evidence of infection which [name] great news however I do think that the patient would benefit from going ahead for with the total contact cast. Heather's actually in a wheelchair today Heather tells me that Heather will use her walker if we initiate the cast. I was very specific about the fact that if we were gonna do the cast I wanted to make sure that Heather was using the walker in order to prevent any falls. Heather tells me Heather does not have stairs that Heather has to traverse on a regular basis at her home. Heather has not had any seizures since last  week again that something that happens to her often Heather tells me Heather did talk to [employer] and they said that it may take up to three weeks to get the brace approved for her. Hopefully that will not take that long but nonetheless in the meantime I do think the cast could be of benefit. 10/12/18 on evaluation today patient appears to be doing rather well in regard to her toe ulcer. It's just been a few days and already this [name] significantly improved both as far as overall appearance and size. Fortunately there's no sign of infection. [name] here for her first obligatory cast change. 10/19/18 Seen today for follow up and management of left great toe ulcer. Wound continues to show improvement. Noted small open area with seroussang drainage with palpation. Denies any increased pain or recent fevers during visit. Heather will continue calcium alginate with offloading shoe. Denies any questions or concerns during visit. 10/26/18 on evaluation today patient appears to be doing about the same as when I last saw her in regard to her wound bed. Fortunately there does not appear to be any signs of infection. Unfortunately Heather continues to have a breakdown in regard to the toe region any time that [name] not in the cast. It takes almost no time at all for this to happen. Nonetheless Heather still has not heard anything from the brace being made by [employer] as to when exactly this will be available to her. Fortunately there [name] no signs of infection at this time. 10/30/18  on evaluation today patient presents for application of the total contact cast as we just received him this morning. Fortunately we are gonna be able to apply this to her today which [name] great news. Heather continues to have no significant pain which [name] good news. Overall I do feel like things have been improving while Heather was the cast [name] when Heather doesn't have a cast that things get worse. Heather still has not really heard anything from [place] regarding her  brace. 11/02/18 upon evaluation today patient's wound already appears to be doing significantly better which [name] good news. Fortunately there does not appear to be any signs of infection also good news. Overall I do think the total contact cast as before [name] helping to heal this area unfortunately it's just not gonna likely keep the area closed and healed without her getting her brace at least. Again the foot drop [name] a significant issue for her. 11/09/18 on evaluation today patient appears to be doing excellent in regard to her toe ulcer which in fact [name] completely healed. Fortunately we finally got the situation squared away with the paperwork which was needed to proceed with getting her brace approved by Medicaid. I have filled that out unfortunately that information has been sent to the orthopedic office that I worked at 2 1/2 years ago and not tired Current wound care measures. Fortunately Heather seems to be doing very well at this time. 11/23/18 on evaluation today patient appears to be doing More Poorly Compared to Last Time I Saw Her. At [facility] Heather Had Completely Healed. Currently [name] continuing to have issues with reopening. Heather states that Heather just found out that the brace was approved through Medicaid now Heather just has to go get measured in order to have this fitted for her and then made. Subsequently Heather does not have an appointment for this yet that [name] going to complicate things we obviously cannot put her back in the cast if we do not have everything measured because they're not gonna be able to measure her foot while [name] in the cast. Unfortunately the other thing that I found out today as well [name] that Heather was in the hospital over the weekend due to having a heroin overdose. Obviously this [name] unfortunate and does have me somewhat worried as well. 11/30/18 on evaluation today patient's toe ulcer actually appears to be doing fairly well. The good news [name] Heather will be getting her Meanor,  [name] ([id number]) brace in the shoes next week on Wednesday. Hopefully we will be able to get this to heal without having to go back in the cast however Heather may need the cast in order to get the wound completely heal and then go from there. Fortunately there's no signs of infection at this time. 12/07/18 on evaluation today patient fortunately did receive her brace and Heather states Heather could tell this definitely makes her walk better. With that being said Heather's been having issues with her toe where Heather noticed yesterday there was a lot of tissue that was loosing off this appears to be much larger than what it was previous. Heather also states that her leg has been read putting much across the top of her foot just about the ankle although this seems to be receiving somewhat. The total area [name] still red and appears to be someone infected as best I can tell. [name] previously taken Bactrim and that may be a good option for her today  as well. We are gonna see what I wound culture shows as well and I think that this [name] definitely appropriate. With that being said outside of the culture I still need to initiate something in the interim and that's what I'm gonna go ahead and select Bactrim [name] a good option for her. 12/14/18 on evaluation today patient appears to be doing better in regard to her left great toe ulcer as compared to last week's evaluation. There's still some erythema although this [name] significantly improved which [name] excellent news. Overall I do believe that [name] making good progress [name] still gonna take some time before [name] where I would like her to be from the standpoint of being able to place her back into the total contact cast. Hopefully we will be where we need to be by next week. 12/21/18 on evaluation today patient actually appears to be doing poorly in regard to her toe ulcer. Heather's been tolerating the dressing changes without complication. Fortunately there's no signs of systemic  infection although Heather does have a lot of drainage from the toe ulcer and this does seem to be causing some issues at this point. Heather does have erythema on the distal portion of her toe that appears to be likely cellulitis. 12/28/18 on evaluation today patient actually appears to be doing a little better in my pinion in regard to her toe ulcer. With that being said Heather still does have some evidence of infection at this time and for her culture Heather had both E. coli as well as enterococcus as organisms noted on evaluation. For that reason I think that though the Keflex likely has treated the E. coli rather well this has really done nothing for the enterococcus. We are going to have to initiate treatment for this specifically. 01/04/19 on evaluation today patient's toe actually appears to be doing better from the standpoint of infection. Heather currently would like to see about putting the cash back on I think that this [name] appropriate as long as Heather takes care of it and keeps it from getting wet. [name] gonna have some drainage we can definitely pass this up with Drawtex and alginate to try to prevent as much drainage as possible from causing the problems. With that being said I do want to at least try her with the cast between now and Tuesday. If there any issues we can't continue to use it then I will discontinue the use of the cast at that point. 01/08/19 on evaluation today patient actually appears to be doing very well as far as her foot ulcer specifically the great toe on the left [name] concerned. Heather did have an area of rubbing on the medial aspect of her left ankle which again [name] from the cast. Fortunately there's no signs of infection at this point in this appears to be a very slight skin breakdown. The patient tells me Heather felt it rubbing but didn't think it was that bad. Fortunately there [name] no signs of active infection at this time which [name] good news. No fevers, chills, nausea, or vomiting noted at  this time. Patient History Information obtained from Patient. Social History Current every day smoker. Medical History Eyes Patient has history of Cataracts, Glaucoma, Optic Neuritis Ear/Nose/Mouth/Throat Patient has history of Chronic sinus problems/congestion, Middle ear problems Endocrine Patient has history of Type II Diabetes Review of Systems (ROS) Constitutional Symptoms ([place]) Denies complaints or symptoms of Fever, Chills. Respiratory Chenette, Bayside ([id number]) The patient  has no complaints or symptoms. Cardiovascular The patient has no complaints or symptoms. Psychiatric The patient has no complaints or symptoms. Objective Constitutional Well-nourished and well-hydrated in no acute distress. Vitals Time Taken: 10:13 AM, Height: 69 in, Weight: 240 lbs, BMI: 35.4, Temperature: 98.2 F, Pulse: 97 bpm, Respiratory Rate: 18 breaths/min, Blood Pressure: 119/83 mmHg. Respiratory normal breathing without difficulty. clear to auscultation bilaterally. Cardiovascular regular rate and rhythm with normal S1, S2. Psychiatric this patient [name] able to make decisions and demonstrates good insight into disease process. Alert and Oriented x 3. pleasant and cooperative. General Notes: Upon inspection today patient's wound bed shows evidence of good granulation at this time. This [name] obviously excellent news. Heather also has good epithelialization around the edge of the wound wrongfully Heather's made good progress. The rubbing on the medial aspect of wrinkle actually can be managed with extra padding. This was done today before reapplication of the total contact cast and the patient stated that it felt good post application. Integumentary (Hair, Skin) Wound #5 status [name] Open. Original cause of wound was Gradually Appeared. The wound [name] located on the Left Toe Great. The wound measures 1.9cm length x 0.9cm width x 0.2cm depth; 1.343cm^2 area and 0.269cm^3 volume. There [name] Fat  Layer (Subcutaneous Tissue) Exposed exposed. There [name] no tunneling or undermining noted. There [name] a medium amount of serous drainage noted. The wound margin [name] epibole. There [name] small (1-33%) pink granulation within the wound bed. There [name] a medium (34-66%) amount of necrotic tissue within the wound bed including Adherent Slough. The periwound skin appearance exhibited: Callus, Maceration, Erythema. The periwound skin appearance did not exhibit: Crepitus, Excoriation, Induration, Rash, Scarring, Dry/Scaly, Atrophie Blanche, Cyanosis, Ecchymosis, Hemosiderin Staining, Mottled, Pallor, Rubor. The surrounding wound skin color [name] noted with erythema which [name] circumferential. Periwound temperature was noted as No Abnormality. Wound #6 status [name] Open. Original cause of wound was Trauma. The wound [name] located on the Left,Medial Malleolus. The wound measures 0.3cm length x 0.4cm width x 0.1cm depth; 0.094cm^2 area and 0.009cm^3 volume. There [name] Fat Layer (Subcutaneous Tissue) Exposed exposed. There [name] no tunneling or undermining noted. There [name] a none present amount of drainage noted. There [name] large (67-100%) red granulation within the wound bed. There [name] no necrotic tissue within the wound bed. The periwound skin appearance did not exhibit: Callus, Crepitus, Excoriation, Induration, Rash, Scarring, Dry/Scaly, Maceration, Atrophie Blanche, Cyanosis, Ecchymosis, Hemosiderin Staining, Mottled, Pallor, Rubor, Erythema. 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 There was a Total Contact Cast Procedure by STONE III, HOYT E., PA-C. Post procedure Diagnosis Wound #5: Same as Pre-Procedure Wound #6 Pre-procedure  diagnosis of Wound #6 [name] a Trauma, Other located on the Left,Medial Malleolus . There was a Total Contact Cast Procedure by STONE III, HOYT E., PA-C. Post procedure Diagnosis Wound #6: Same as Pre-Procedure Plan Wound Cleansing: Wound #5 Left Toe Great: Clean wound with Normal Saline. Wound #6 Left,Medial Malleolus: Clean wound with Normal Saline. Anesthetic (add to Medication List): Wound #5 Left Toe Great: Topical Lidocaine 4% cream applied to wound bed prior to debridement (In Clinic Only). Primary Wound Dressing: Wound #5 Left Toe Great: Silver Alginate - Gentamicin on wound bed Secondary Dressing: Wound #5 Left  Toe Great: Foam Drawtex Wound #6 Left,Medial Malleolus: Foam Drawtex Dressing Change Frequency: Wound #5 Left Toe Great: Change dressing every week Wound #6 Left,Medial Malleolus: Espinal, [name] ([id number]) Change dressing every week Follow-up Appointments: Wound #5 Left Toe Great: Return Appointment in 1 week. Wound #6 Left,Medial Malleolus: Return Appointment in 1 week. Off-Loading: Wound #5 Left Toe Great: Total Contact Cast to Left Lower Extremity Wound #6 Left,Medial Malleolus: Total Contact Cast to Left Lower Extremity Medications-please add to medication list.: Wound #5 Left Toe Great: Topical Antibiotic I'm in a recommend that we continue with the above wound to measures for the next week the patient [name] in agreement with plan. Subsequently we will see were things stand at follow-up. If anything changes or worsens meantime patient will contact the office let me know. If Heather has any rubbing Heather knows the contact the office let me know. Please see above for specific wound care orders. We will see patient for re-evaluation in 1 week(s) here in the clinic. If anything worsens or changes patient will contact our office for additional recommendations. Electronic Signature(s) Signed: 01/08/2019 5:25:20 PM By: Worthy Keeler PA-C Entered By: Worthy Keeler on 01/08/2019 12:59:34 [name] ([id number]) -------------------------------------------------------------------------------- ROS/PFSH Details Patient Name: Heather Dear A. Date of Service: 01/08/2019 10:00 AM Medical Record Number: [id number] Patient Account Number: [id number] Date of Birth/Sex: 01-11-1976 (43 y.o. F) Treating RN: Montey Hora Primary Care Provider: Priscille Kluver Other Clinician: Referring Provider: Priscille Kluver Treating Provider/Extender: STONE III, HOYT Weeks in Treatment: 22 Information Obtained From Patient Wound History Do you currently have one or more open woundso Yes How many open wounds do you currently haveo 1 Constitutional Symptoms (General Health) Complaints and Symptoms: Negative for: Fever; Chills Eyes Medical History: Positive for: Cataracts; Glaucoma; Optic Neuritis Ear/Nose/Mouth/Throat Medical History: Positive for: Chronic sinus problems/congestion; Middle ear problems Respiratory Complaints and Symptoms: No Complaints or Symptoms Cardiovascular Complaints and Symptoms: No Complaints or Symptoms Endocrine Medical History: Positive for: Type II Diabetes Time with diabetes: 10 years Treated with: Insulin, Oral agents Blood sugar tested every day: No Blood sugar testing results: Lunch: 350 Psychiatric Complaints and Symptoms: No Complaints or Symptoms HBO Extended History Items Constable, Deema A. ([id number]) Eyes: Eyes: Ear/Nose/Mouth/Throat: Ear/Nose/Mouth/Throat: Cataracts Glaucoma Chronic sinus Middle ear problems problems/congestion Immunizations Pneumococcal Vaccine: Received Pneumococcal Vaccination: No Implantable Devices No devices added Family and Social History Current every day smoker Physician Affirmation I have reviewed and agree with the above information. Electronic Signature(s) Signed: 01/08/2019 4:44:55 PM By: Montey Hora Signed: 01/08/2019 5:25:20 PM By: Worthy Keeler PA-C Entered By:  Worthy Keeler on 01/08/2019 12:52:28 [name] ([id number]) -------------------------------------------------------------------------------- Total Contact Cast Details Patient Name: Heather Dear A. Date of Service: 01/08/2019 10:00 AM Medical Record Number: [id number] Patient Account Number: [id number] Date of Birth/Sex: 1975/11/18 (43 y.o. F) Treating RN: Montey Hora Primary Care Provider: Priscille Kluver Other Clinician: Referring Provider: Priscille Kluver Treating Provider/Extender: STONE III, HOYT Weeks in Treatment: 22 Total Contact Cast Applied for Wound Assessment: Wound #5 Left Toe Great Performed By: Physician Emilio Math., PA-C Post Procedure Diagnosis Same as Pre-procedure Electronic Signature(s) Signed: 01/08/2019 4:44:55 PM By: Montey Hora Signed: 01/08/2019 5:25:20 PM By: Worthy Keeler PA-C Entered By: Montey Hora on 01/08/2019 10:38:40 Manasco, Reigan A. ([id number]) -------------------------------------------------------------------------------- Total Contact Cast Details Patient Name: Heather Dear A. Date of Service: 01/08/2019 10:00 AM Medical Record Number: [id number] Patient Account Number: [id number] Date of Birth/Sex: [birth year] (43 y.o. F) Treating RN: Montey Hora  Primary Care Provider: Priscille Kluver Other Clinician: Referring Provider: Priscille Kluver Treating Provider/Extender: STONE III, HOYT Weeks in Treatment: 22 Total Contact Cast Applied for Wound Assessment: Wound #6 Left,Medial Malleolus Performed By: Physician Emilio Math., PA-C Post Procedure Diagnosis Same as Pre-procedure Electronic Signature(s) Signed: 01/08/2019 4:44:55 PM By: Montey Hora Signed: 01/08/2019 5:25:20 PM By: Worthy Keeler PA-C Entered By: Montey Hora on 01/08/2019 10:38:40 [name] ([id number]) -------------------------------------------------------------------------------- [place] Details Patient Name: Heather Dear A. Date of  Service: 01/08/2019 Medical Record Number: [id number] Patient Account Number: [id number] Date of Birth/Sex: July 24, 1976 (43 y.o. 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

## 2019-01-09 NOTE — Progress Notes (Signed)
Heather, [name] ([id number]) Visit Report for 01/08/2019 Arrival Information Details Patient Name: [name], [name] Date of Service: 01/08/2019 10:00 AM Medical Record Number: [id number] Patient Account Number: [id number] Date of Birth/Sex: October 02, 1976 (43 y.o. F) Treating RN: Harold Barban Primary Care Kristian Mogg: Priscille Kluver Other Clinician: Referring Lindy Garczynski: Priscille Kluver Treating Evone Arseneau/Extender: Melburn Hake, HOYT Weeks in Treatment: 22 Visit Information History Since Last Visit Added or deleted any medications: No Patient Arrived: Wheel Chair Any new allergies or adverse reactions: No Arrival Time: 10:09 Had a fall or experienced change in No activities of daily living that may affect Accompanied By: mother risk of falls: Transfer Assistance: None Signs or symptoms of abuse/neglect since last visito No Patient Identification Verified: Yes Hospitalized since last visit: No Secondary Verification Process Completed: Yes Has Dressing in Place as Prescribed: Yes Patient Requires Transmission-Based No Has Footwear/Offloading in Place as Prescribed: Yes Precautions: Left: Total Contact Patient Has Alerts: No Cast Pain Present Now: Yes Electronic Signature(s) Signed: 01/08/2019 4:48:10 PM By: Harold Barban Entered By: Harold Barban on 01/08/2019 10:10:46 [name] ([id number]) -------------------------------------------------------------------------------- Lower Extremity Assessment Details Patient Name: Heather Dear A. Date of Service: 01/08/2019 10:00 AM Medical Record Number: [id number] Patient Account Number: [id number] Date of Birth/Sex: 12/03/1975 (43 y.o. F) Treating RN: Army Melia Primary Care Morelia Cassells: Priscille Kluver Other Clinician: Referring Terris Bodin: Priscille Kluver Treating Kimberleigh Mehan/Extender: STONE III, HOYT Weeks in Treatment: 22 Edema Assessment Assessed: [Left: No] [Right: No] Edema: [Left: N] [Right: o] Electronic Signature(s) Signed: 01/08/2019  3:57:00 PM By: Army Melia Entered By: Army Melia on 01/08/2019 10:25:49 Buehrer, Ernestina A. ([id number]) -------------------------------------------------------------------------------- Multi Wound Chart Details Patient Name: Heather Dear A. Date of Service: 01/08/2019 10:00 AM Medical Record Number: [id number] Patient Account Number: [id number] Date of Birth/Sex: 1975-11-03 (43 y.o. F) Treating RN: Montey Hora Primary Care Lennix Rotundo: Priscille Kluver Other Clinician: Referring Tinia Oravec: Priscille Kluver Treating Aaliya Maultsby/Extender: STONE III, HOYT Weeks in Treatment: 22 Vital Signs Height(in): 69 Pulse(bpm): 97 Weight(lbs): 240 Blood Pressure(mmHg): 119/83 Body Mass Index(BMI): 35 Temperature(F): 98.2 Respiratory Rate 18 (breaths/min): Photos: [N/A:N/A] Wound Location: Left Toe Great Left Malleolus - Medial N/A Wounding Event: Gradually Appeared Trauma N/A Primary Etiology: Diabetic Wound/Ulcer of the Trauma, Other N/A Lower Extremity Comorbid History: Cataracts, Glaucoma, Optic Cataracts, Glaucoma, Optic N/A Neuritis, Chronic sinus Neuritis, Chronic sinus problems/congestion, Middle problems/congestion, Middle ear problems, Type II Diabetes ear problems, Type II Diabetes Date Acquired: 11/16/2018 01/04/2019 N/A Weeks of Treatment: 6 0 N/A Wound Status: Open Open N/A Measurements L x W x D 1.9x0.9x0.2 0.3x0.4x0.1 N/A (cm) Area (cm) : 1.343 0.094 N/A Volume (cm) : 0.269 0.009 N/A % Reduction in Area: -241.70% N/A N/A % Reduction in Volume: -71.30% N/A N/A Classification: Grade 3 Partial Thickness N/A Exudate Amount: Medium None Present N/A Exudate Type: Serous N/A N/A Exudate Color: amber N/A N/A Wound Margin: Epibole N/A N/A Granulation Amount: Small (1-33%) Large (67-100%) N/A Granulation Quality: Pink Red N/A Necrotic Amount: Medium (34-66%) None Present (0%) N/A Exposed Structures: Fat Layer (Subcutaneous Fat Layer (Subcutaneous N/A Tissue) Exposed: Yes Tissue)  Exposed: Yes Fascia: No Fascia: No Tendon: No Tendon: No Pottle, Nakkia A. ([id number]) Muscle: No Muscle: No Joint: No Joint: No Bone: No Bone: No Epithelialization: None Small (1-33%) N/A Periwound Skin Texture: Callus: Yes Excoriation: No N/A Excoriation: No Induration: No Induration: No Callus: No Crepitus: No Crepitus: No Rash: No Rash: No Scarring: No Scarring: No Periwound Skin Moisture: Maceration: Yes Maceration: No N/A Dry/Scaly: No Dry/Scaly: No Periwound Skin Color: Erythema: Yes Atrophie Blanche: No N/A  Atrophie Blanche: No Cyanosis: No Cyanosis: No Ecchymosis: No Ecchymosis: No Erythema: No Hemosiderin Staining: No Hemosiderin Staining: No Mottled: No Mottled: No Pallor: No Pallor: No Rubor: No Rubor: No Erythema Location: Circumferential N/A N/A Temperature: No Abnormality N/A N/A Tenderness on Palpation: No No N/A Wound Preparation: Ulcer Cleansing: Ulcer Cleansing: N/A Rinsed/Irrigated with Saline Rinsed/Irrigated with Saline Topical Anesthetic Applied: Topical Anesthetic Applied: Other: lidocaine 4% None Treatment Notes Electronic Signature(s) Signed: 01/08/2019 4:44:55 PM By: Montey Hora Entered By: Montey Hora on 01/08/2019 10:36:20 [name] ([id number]) -------------------------------------------------------------------------------- [place] Details Patient Name: Heather Dear A. Date of Service: 01/08/2019 10:00 AM Medical Record Number: [id number] Patient Account Number: [id number] Date of Birth/Sex: 30-Aug-1976 (43 y.o. F) Treating RN: Montey Hora Primary Care Lurine Imel: Priscille Kluver Other Clinician: Referring Jaleen Finch: Priscille Kluver Treating Beauregard Jarrells/Extender: Melburn Hake, HOYT Weeks in Treatment: 22 Active Inactive Abuse / Safety / Falls / Self Care Management Nursing Diagnoses: History of Falls Goals: Patient will not experience any injury related to falls Date Initiated:  08/07/2018 Target Resolution Date: 10/19/2018 Goal Status: Active Interventions: Assess fall risk on admission and as needed Notes: Orientation to the Wound Care Program Nursing Diagnoses: Knowledge deficit related to the wound healing center program Goals: Patient/caregiver will verbalize understanding of the [facility] Program Date Initiated: 08/07/2018 Target Resolution Date: 10/19/2018 Goal Status: Active Interventions: Provide education on orientation to the wound center Notes: Soft Tissue Infection Nursing Diagnoses: Impaired tissue integrity Goals: Patient will remain free of wound infection Date Initiated: 08/07/2018 Target Resolution Date: 10/19/2018 Goal Status: Active Interventions: Assess signs and symptoms of infection every visit Fusselman, Nakyia A. ([id number]) Notes: Wound/Skin Impairment Nursing Diagnoses: Impaired tissue integrity Goals: Ulcer/skin breakdown will heal within 14 weeks Date Initiated: 08/07/2018 Target Resolution Date: 10/19/2018 Goal Status: Active Interventions: Assess patient/caregiver ability to obtain necessary supplies Notes: Electronic Signature(s) Signed: 01/08/2019 4:44:55 PM By: Montey Hora Entered By: Montey Hora on 01/08/2019 10:36:06 [name] ([id number]) -------------------------------------------------------------------------------- Pain Assessment Details Patient Name: Heather Dear A. Date of Service: 01/08/2019 10:00 AM Medical Record Number: [id number] Patient Account Number: [id number] Date of Birth/Sex: 1976/06/25 (43 y.o. F) Treating RN: Harold Barban Primary Care Bertis Hustead: Priscille Kluver Other Clinician: Referring Ranee Peasley: Priscille Kluver Treating Caleigha Zale/Extender: STONE III, HOYT Weeks in Treatment: 22 Active Problems Location of Pain Severity and Description of Pain Patient Has Paino Yes Site Locations Rate the pain. Current Pain Level: 4 Character of Pain Describe the Pain:  Difficult to Pinpoint Pain Management and Medication Current Pain Management: Notes Left Medial malleolus. patient states its been rubbing since the first day of applying cast. Electronic Signature(s) Signed: 01/08/2019 4:48:10 PM By: Harold Barban Entered By: Harold Barban on 01/08/2019 10:13:14 [name] ([id number]) -------------------------------------------------------------------------------- Patient/Caregiver Education Details Patient Name: Heather Dear A. Date of Service: 01/08/2019 10:00 AM Medical Record Number: [id number] Patient Account Number: [id number] Date of Birth/Gender: Feb 03, 1976 (43 y.o. F) Treating RN: Montey Hora Primary Care Physician: Priscille Kluver Other Clinician: Referring Physician: Priscille Kluver Treating Physician/Extender: Sharalyn Ink in Treatment: 22 Education Assessment Education Provided To: Patient and Caregiver Education Topics Provided Offloading: Handouts: Other: TCC precautions - call if you feel rubbing Methods: Explain/Verbal Responses: State content correctly Electronic Signature(s) Signed: 01/08/2019 4:44:55 PM By: Montey Hora Entered By: Montey Hora on 01/08/2019 10:45:30 Roesler, Drenda A. ([id number]) -------------------------------------------------------------------------------- Wound Assessment Details Patient Name: Heather Dear A. Date of Service: 01/08/2019 10:00 AM Medical Record Number: [id number] Patient Account Number: [id number] Date of Birth/Sex: 1976-08-08 (43 y.o. F) Treating RN:  Army Melia Primary Care Lavern Maslow: Priscille Kluver Other Clinician: Referring Kelina Beauchamp: Priscille Kluver Treating Guillermina Shaft/Extender: Melburn Hake, HOYT Weeks in Treatment: 22 Wound Status Wound Number: 5 Primary Diabetic Wound/Ulcer of the Lower Extremity Etiology: Wound Location: Left Toe Great Wound Open Wounding Event: Gradually Appeared Status: Date Acquired: 11/16/2018 Comorbid Cataracts, Glaucoma, Optic Neuritis,  Chronic Weeks Of Treatment: 6 History: sinus problems/congestion, Middle ear problems, Clustered Wound: No Type II Diabetes Photos Photo Uploaded By: Army Melia on 01/08/2019 10:36:03 Wound Measurements Length: (cm) 1.9 Width: (cm) 0.9 Depth: (cm) 0.2 Area: (cm) 1.343 Volume: (cm) 0.269 % Reduction in Area: -241.7% % Reduction in Volume: -71.3% Epithelialization: None Tunneling: No Undermining: No Wound Description Classification: Grade 3 Wound Margin: Epibole Exudate Amount: Medium Exudate Type: Serous Exudate Color: amber Foul Odor After Cleansing: No Slough/Fibrino No Wound Bed Granulation Amount: Small (1-33%) Exposed Structure Granulation Quality: Pink Fascia Exposed: No Necrotic Amount: Medium (34-66%) Fat Layer (Subcutaneous Tissue) Exposed: Yes Necrotic Quality: Adherent Slough Tendon Exposed: No Muscle Exposed: No Joint Exposed: No Bone Exposed: No Periwound Skin Texture Mendell, Cheryn A. ([id number]) Texture Color No Abnormalities Noted: No No Abnormalities Noted: No Callus: Yes Atrophie Blanche: No Crepitus: No Cyanosis: No Excoriation: No Ecchymosis: No Induration: No Erythema: Yes Rash: No Erythema Location: Circumferential Scarring: No Hemosiderin Staining: No Mottled: No Moisture Pallor: No No Abnormalities Noted: No Rubor: No Dry / Scaly: No Maceration: Yes Temperature / Pain Temperature: No Abnormality Wound Preparation Ulcer Cleansing: Rinsed/Irrigated with Saline Topical Anesthetic Applied: Other: lidocaine 4%, Electronic Signature(s) Signed: 01/08/2019 3:57:00 PM By: Army Melia Entered By: Army Melia on 01/08/2019 10:23:48 [name] ([id number]) -------------------------------------------------------------------------------- Wound Assessment Details Patient Name: Heather Dear A. Date of Service: 01/08/2019 10:00 AM Medical Record Number: [id number] Patient Account Number: [id number] Date of Birth/Sex:  1976-03-03 (43 y.o. F) Treating RN: Army Melia Primary Care Mazen Marcin: Priscille Kluver Other Clinician: Referring Calle Schader: Priscille Kluver Treating Loetta Connelley/Extender: STONE III, HOYT Weeks in Treatment: 22 Wound Status Wound Number: 6 Primary Trauma, Other Etiology: Wound Location: Left Malleolus - Medial Wound Open Wounding Event: Trauma Status: Date Acquired: 01/04/2019 Comorbid Cataracts, Glaucoma, Optic Neuritis, Chronic Weeks Of Treatment: 0 History: sinus problems/congestion, Middle ear problems, Clustered Wound: No Type II Diabetes Photos Photo Uploaded By: Army Melia on 01/08/2019 10:36:03 Wound Measurements Length: (cm) 0.3 Width: (cm) 0.4 Depth: (cm) 0.1 Area: (cm) 0.094 Volume: (cm) 0.009 % Reduction in Area: % Reduction in Volume: Epithelialization: Small (1-33%) Tunneling: No Undermining: No Wound Description Classification: Partial Thickness Exudate Amount: None Present Foul Odor After Cleansing: No Slough/Fibrino No Wound Bed Granulation Amount: Large (67-100%) Exposed Structure Granulation Quality: Red Fascia Exposed: No Necrotic Amount: None Present (0%) Fat Layer (Subcutaneous Tissue) Exposed: Yes Tendon Exposed: No Muscle Exposed: No Joint Exposed: No Bone Exposed: No Periwound Skin Texture Texture Color No Abnormalities Noted: No No Abnormalities Noted: No Corea, Relda A. ([id number]) Callus: No Atrophie Blanche: No Crepitus: No Cyanosis: No Excoriation: No Ecchymosis: No Induration: No Erythema: No Rash: No Hemosiderin Staining: No Scarring: No Mottled: No Pallor: No Moisture Rubor: No No Abnormalities Noted: No Dry / Scaly: No Maceration: No Wound Preparation Ulcer Cleansing: Rinsed/Irrigated with Saline Topical Anesthetic Applied: None Electronic Signature(s) Signed: 01/08/2019 3:57:00 PM By: Army Melia Entered By: Army Melia on 01/08/2019 10:25:34 [name]  ([id number]) -------------------------------------------------------------------------------- Vitals Details Patient Name: Heather Dear A. Date of Service: 01/08/2019 10:00 AM Medical Record Number: [id number] Patient Account Number: [id number] Date of Birth/Sex: 1976/01/03 (43 y.o. F) Treating RN: Harold Barban Primary Care Shermaine Brigham:  DAVIS, DEBRA Other Clinician: Referring Alessa Mazur: DAVIS, DEBRA Treating Stevana Dufner/Extender: STONE III, HOYT Weeks in Treatment: 22 Vital Signs Time Taken: 10:13 Temperature (F): 98.2 Height (in): 69 Pulse (bpm): 97 Weight (lbs): 240 Respiratory Rate (breaths/min): 18 Body Mass Index (BMI): 35.4 Blood Pressure (mmHg): 119/83 Reference Range: 80 - 120 mg / dl Electronic Signature(s) Signed: 01/08/2019 4:48:10 PM By: Harold Barban Entered By: Harold Barban on 01/08/2019 10:13:43

## 2019-01-15 ENCOUNTER — Encounter: Payer: Medicaid Other | Admitting: Physician Assistant

## 2019-01-15 ENCOUNTER — Other Ambulatory Visit: Payer: Self-pay

## 2019-01-15 DIAGNOSIS — E11621 Type 2 diabetes mellitus with foot ulcer: Secondary | ICD-10-CM | POA: Diagnosis not present

## 2019-01-16 NOTE — Progress Notes (Signed)
RAGENA, FIOLA ([id number]) Visit Report for 01/15/2019 Fall Risk Assessment Details Patient Name: [name], [name] Date of Service: 01/15/2019 1:45 PM Medical Record Number: [id number] Patient Account Number: [id number] Date of Birth/Sex: 02-15-1976 (43 y.o. F) Treating RN: Montey Hora Primary Care Keilin Gamboa: Priscille Kluver Other Clinician: Referring Garlon Tuggle: Priscille Kluver Treating Stephanne Greeley/Extender: Melburn Hake, HOYT Weeks in Treatment: 23 Fall Risk Assessment Items Have you had 2 or more falls in the last 12 monthso 0 Yes Have you had any fall that resulted in injury in the last 12 monthso 0 No FALL RISK ASSESSMENT: History of falling - immediate or within 3 months 25 Yes Secondary diagnosis 0 No Ambulatory aid None/bed rest/wheelchair/nurse 0 No Crutches/cane/walker 15 Yes Furniture 0 No IV Access/Saline Lock 0 No Gait/Training Normal/bed rest/immobile 0 No Weak 10 Yes Impaired 20 Yes Mental Status Oriented to own ability 0 Yes Electronic Signature(s) Signed: 01/15/2019 5:06:46 PM By: Montey Hora Entered By: Montey Hora on 01/15/2019 14:55:53

## 2019-01-17 NOTE — Progress Notes (Signed)
PADME, ARRIAGA ([id number]) Visit Report for 01/15/2019 Arrival Information Details Patient Name: [name], [name] Date of Service: 01/15/2019 1:45 PM Medical Record Number: [id number] Patient Account Number: [id number] Date of Birth/Sex: 06/26/1976 (43 y.o. F) Treating RN: Montey Hora Primary Care Toivo Bordon: Priscille Kluver Other Clinician: Referring Guila Owensby: Priscille Kluver Treating Mohini Heathcock/Extender: Melburn Hake, HOYT Weeks in Treatment: 23 Visit Information History Since Last Visit Added or deleted any medications: No Patient Arrived: Wheel Chair Any new allergies or adverse reactions: No Arrival Time: 13:56 Had [name] fall or experienced change in Yes activities of daily living that may affect Accompanied By: self risk of falls: Transfer Assistance: None Signs or symptoms of abuse/neglect since last visito No Patient Identification Verified: Yes Hospitalized since last visit: No Secondary Verification Process Completed: Yes Implantable device outside of the clinic excluding No Patient Requires Transmission-Based No cellular tissue based products placed in the center Precautions: since last visit: Patient Has Alerts: No Has Dressing in Place as Prescribed: Yes Pain Present Now: No Electronic Signature(s) Signed: 01/15/2019 4:07:26 PM By: Lorine Bears RCP, RRT, CHT Entered By: Lorine Bears on 01/15/2019 13:57:28 Kazmi, Wynona [name]. ([id number]) -------------------------------------------------------------------------------- Clinic Level of Care Assessment Details Patient Name: [name] Date of Service: 01/15/2019 1:45 PM Medical Record Number: [id number] Patient Account Number: [id number] Date of Birth/Sex: 03-28-1976 (43 y.o. F) Treating RN: Montey Hora Primary Care Lott Seelbach: Priscille Kluver Other Clinician: Referring Atif Chapple: Priscille Kluver Treating Faithe Ariola/Extender: Melburn Hake, HOYT Weeks in Treatment: 23 Clinic Level of Care Assessment  Items TOOL 4 Quantity Score []  - Use when only an EandM is performed on FOLLOW-UP visit 0 ASSESSMENTS - Nursing Assessment / Reassessment X - Reassessment of Co-morbidities (includes updates in patient status) 1 10 X- 1 5 Reassessment of Adherence to Treatment Plan ASSESSMENTS - Wound and Skin Assessment / Reassessment []  - Simple Wound Assessment / Reassessment - one wound 0 X- 2 5 Complex Wound Assessment / Reassessment - multiple wounds []  - 0 Dermatologic / Skin Assessment (not related to wound area) ASSESSMENTS - Focused Assessment []  - Circumferential Edema Measurements - multi extremities 0 []  - 0 Nutritional Assessment / Counseling / Intervention X- 1 5 Lower Extremity Assessment (monofilament, tuning fork, pulses) []  - 0 Peripheral Arterial Disease Assessment (using hand held doppler) ASSESSMENTS - Ostomy and/or Continence Assessment and Care []  - Incontinence Assessment and Management 0 []  - 0 Ostomy Care Assessment and Management (repouching, etc.) PROCESS - Coordination of Care X - Simple Patient / Family Education for ongoing care 1 15 []  - 0 Complex (extensive) Patient / Family Education for ongoing care X- 1 10 Staff obtains Programmer, systems, Records, Test Results / Process Orders []  - 0 Staff telephones HHA, Nursing Homes / Clarify orders / etc []  - 0 Routine Transfer to another Facility (non-emergent condition) []  - 0 Routine Hospital Admission (non-emergent condition) []  - 0 New Admissions / Biomedical engineer / Ordering NPWT, Apligraf, etc. []  - 0 Emergency Hospital Admission (emergent condition) X- 1 10 Simple Discharge Coordination Prajapati, Lashanta [name]. ([id number]) []  - 0 Complex (extensive) Discharge Coordination PROCESS - Special Needs []  - Pediatric / Minor Patient Management 0 []  - 0 Isolation Patient Management []  - 0 Hearing / Language / Visual special needs []  - 0 Assessment of Community assistance (transportation, D/C planning, etc.) []  -  0 Additional assistance / Altered mentation []  - 0 Support Surface(s) Assessment (bed, cushion, seat, etc.) INTERVENTIONS - Wound Cleansing / Measurement []  - Simple Wound Cleansing - one wound 0 X-  2 5 Complex Wound Cleansing - multiple wounds X- 1 5 Wound Imaging (photographs - any number of wounds) []  - 0 Wound Tracing (instead of photographs) []  - 0 Simple Wound Measurement - one wound X- 2 5 Complex Wound Measurement - multiple wounds INTERVENTIONS - Wound Dressings X - Small Wound Dressing one or multiple wounds 2 10 []  - 0 Medium Wound Dressing one or multiple wounds []  - 0 Large Wound Dressing one or multiple wounds X- 1 5 Application of Medications - topical []  - 0 Application of Medications - injection INTERVENTIONS - Miscellaneous []  - External ear exam 0 []  - 0 Specimen Collection (cultures, biopsies, blood, body fluids, etc.) []  - 0 Specimen(s) / Culture(s) sent or taken to Lab for analysis []  - 0 Patient Transfer (multiple staff / Civil Service fast streamer / Similar devices) []  - 0 Simple Staple / Suture removal (25 or less) []  - 0 Complex Staple / Suture removal (26 or more) []  - 0 Hypo / Hyperglycemic Management (close monitor of Blood Glucose) []  - 0 Ankle / Brachial Index (ABI) - do not check if billed separately X- 1 5 Vital Signs Hurwitz, Tomorrow [name]. ([id number]) Has the patient been seen at the hospital within the last three years: Yes Total Score: 120 Level Of Care: New/Established - Level 4 Electronic Signature(s) Signed: 01/15/2019 5:06:46 PM By: Montey Hora Entered By: Montey Hora on 01/15/2019 15:00:29 [name] ([id number]) -------------------------------------------------------------------------------- Encounter Discharge Information Details Patient Name: [name] Date of Service: 01/15/2019 1:45 PM Medical Record Number: [id number] Patient Account Number: [id number] Date of Birth/Sex: Jul 16, 1976 (43 y.o. F) Treating RN: Harold Barban Primary Care Michaila Kenney: Priscille Kluver Other Clinician: Referring Layken Beg: Priscille Kluver Treating Arnetha Silverthorne/Extender: Melburn Hake, HOYT Weeks in Treatment: 109 Encounter Discharge Information Items Discharge Condition: Stable Ambulatory Status: Wheelchair Discharge Destination: Home Transportation: Private Auto Accompanied By: family Schedule Follow-up Appointment: Yes Clinical Summary of Care: Electronic Signature(s) Signed: 01/15/2019 4:41:12 PM By: Harold Barban Entered By: Harold Barban on 01/15/2019 16:41:12 Maceachern, Emmerson AMarland Kitchen ([id number]) -------------------------------------------------------------------------------- Lower Extremity Assessment Details Patient Name: [name] Date of Service: 01/15/2019 1:45 PM Medical Record Number: [id number] Patient Account Number: [id number] Date of Birth/Sex: 1976/04/04 (43 y.o. F) Treating RN: Army Melia Primary Care Rondrick Barreira: Priscille Kluver Other Clinician: Referring Paige Vanderwoude: Priscille Kluver Treating Izael Bessinger/Extender: STONE III, HOYT Weeks in Treatment: 23 Edema Assessment Assessed: [Left: No] [Right: No] Edema: [Left: N] [Right: o] Vascular Assessment Pulses: Dorsalis Pedis Palpable: [Left:Yes] Extremity colors, hair growth, and conditions: Extremity Color: [Left:Normal] Hair Growth on Extremity: [Left:Yes] Temperature of Extremity: [Left:Warm < 3 seconds] Toe Nail Assessment Left: Right: Thick: No Discolored: No Deformed: No Improper Length and Hygiene: No Electronic Signature(s) Signed: 01/15/2019 4:18:28 PM By: Army Melia Entered By: Army Melia on 01/15/2019 14:30:04 Cullens, [name] ([id number]) -------------------------------------------------------------------------------- Multi Wound Chart Details Patient Name: [name] Date of Service: 01/15/2019 1:45 PM Medical Record Number: [id number] Patient Account Number: [id number] Date of Birth/Sex: 1975-11-08 (43 y.o. 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, Other Lower Extremity] [N/[name]:N/[name]] Comorbid History: [5:Cataracts, Glaucoma, Optic Neuritis, Chronic sinus problems/congestion, Middle problems/congestion, Middle  ear problems, Type II Diabetes ear problems, Type II Diabetes] [6:Cataracts, Glaucoma, Optic Neuritis, Chronic sinus]  [N/[name]:N/[name]] Date Acquired: [5:11/16/2018] [6:01/04/2019] [N/[name]:N/[name]] Weeks of Treatment: [5:7] [6:1] [N/[name]:N/[name]] Wound Status: [5:Open] [6:Open] [N/[name]:N/[name]] Measurements L x W x D [5:1x1x0.2] [6:0.2x0.2x0.1] [N/[name]:N/[name]] (cm) Area (cm) : [5:0.785] [6:0.031] [N/[name]:N/[name]] Volume (cm) : [5:0.157] [6:0.003] [N/[name]:N/[name]] % Reduction in Area: [5:-99.70%] [6:67.00%] [N/[name]:N/[name]] % Reduction in Volume: [5:0.00%] [6:66.70%] [N/[name]:N/[name]] Classification: [5:Grade 3] [6:Partial Thickness] [N/[name]:N/[name]] Exudate Amount: [5:Medium] [6:None Present] [N/[name]:N/[name]] Exudate Type: [5:Serous] [6:N/[name]] [N/[name]:N/[name]] Exudate Color: [5:amber] [6:N/[name]] [N/[name]:N/[name]] Wound Margin: [5:Epibole] [6:N/[name]] [N/[name]:N/[name]] Granulation Amount: [5:Small (1-33%)] [6:Large (67-100%)] [N/[name]:N/[name]] Granulation Quality: [5:Pink] [6:Red] [N/[name]:N/[name]] Necrotic Amount: [5:Medium (34-66%)] [6:None Present (0%)] [N/[name]:N/[name]] Exposed Structures: [5:Fat Layer (Subcutaneous Tissue) Exposed: Yes Fascia: No Tendon: No Muscle: No Joint: No Bone: No] [6:Fat Layer (Subcutaneous Tissue) Exposed: Yes Fascia: No Tendon: No Muscle: No Joint: No Bone: No]  [N/[name]:N/[name]] Epithelialization: [5:None] [6:Small (1-33%)] [N/[name]:N/[name]] Periwound Skin Texture: [5:Callus: Yes Excoriation: No Induration: No] [6:Excoriation: No Induration: No Callus: No] [N/[name]:N/[name]] Crepitus: No Crepitus: No Rash: No Rash: No Scarring: No Scarring: No Periwound Skin Moisture: Maceration: Yes Maceration: No N/[name] Dry/Scaly: No Dry/Scaly: No Periwound Skin Color: Erythema: Yes Atrophie Blanche: No N/[name] Atrophie Blanche: No Cyanosis: No Cyanosis: No Ecchymosis: No Ecchymosis: No Erythema: No Hemosiderin Staining: No Hemosiderin Staining: No Mottled: No Mottled: No Pallor: No Pallor: No Rubor: No Rubor: No Erythema Location: Circumferential N/[name] N/[name] Temperature: No Abnormality No Abnormality N/[name] Tenderness on Palpation: No No N/[name] Wound Preparation: Ulcer Cleansing: Ulcer Cleansing: N/[name] Rinsed/Irrigated with Saline Rinsed/Irrigated with Saline Topical Anesthetic Applied: Topical Anesthetic Applied: Other: lidocaine 4% None Treatment Notes Electronic Signature(s) Signed: 01/15/2019 5:06:46 PM By: Montey Hora Entered By: Montey Hora on 01/15/2019 14:55:26 [name] ([id number]) -------------------------------------------------------------------------------- [place] Details Patient Name: [name] Date of Service: 01/15/2019 1:45 PM Medical Record Number: [id number] Patient Account Number: [id number] Date of Birth/Sex: 26-Apr-1976 (43 y.o. F) Treating RN: Montey Hora Primary Care Melchizedek Espinola: Priscille Kluver Other Clinician: Referring March Steyer: Priscille Kluver Treating Tawnia Schirm/Extender: Melburn Hake, HOYT Weeks in Treatment: 58 Active Inactive Abuse / Safety / Falls / Self Care Management Nursing Diagnoses: History of Falls Goals: Patient will not experience any injury related to falls Date Initiated: 08/07/2018 Target Resolution Date: 10/19/2018 Goal Status: Active Interventions: Assess fall risk on admission and as  needed Notes: Orientation to the Wound Care Program Nursing Diagnoses: Knowledge deficit related to the wound healing center program Goals: Patient/caregiver will verbalize understanding of the [facility] Program Date Initiated: 08/07/2018 Target Resolution Date: 10/19/2018 Goal Status: Active Interventions: Provide education on orientation to the wound center Notes: Soft Tissue Infection Nursing Diagnoses: Impaired tissue integrity Goals: Patient will remain free of wound infection Date Initiated: 08/07/2018 Target Resolution Date: 10/19/2018 Goal Status: Active Interventions: Assess signs and symptoms of infection every visit Eisemann, Suly [name]. ([id number]) Notes: Wound/Skin Impairment Nursing Diagnoses: Impaired tissue integrity Goals: Ulcer/skin breakdown will heal within 14 weeks Date Initiated: 08/07/2018 Target Resolution Date: 10/19/2018 Goal Status: Active Interventions: Assess patient/caregiver ability to obtain necessary supplies Notes: Electronic Signature(s) Signed: 01/15/2019 5:06:46 PM By: Montey Hora Entered By: Montey Hora on 01/15/2019 14:54:52 Ruddy, Marco [name]. ([id number]) -------------------------------------------------------------------------------- Pain Assessment Details Patient Name: [name] Date of Service: 01/15/2019 1:45 PM Medical Record Number: [id number] Patient Account Number: [id number] Date of Birth/Sex: 06/26/1976 (43 y.o. F) Treating RN: Montey Hora Primary Care Shan Padgett: Priscille Kluver Other Clinician: Referring Hanifa Antonetti: Priscille Kluver Treating Almalik Weissberg/Extender: STONE III, HOYT Weeks in Treatment: 23 Active Problems  Location of Pain Severity and Description of Pain Patient Has Paino No Site Locations Pain Management and Medication Current Pain Management: Electronic Signature(s) Signed: 01/15/2019 4:07:26 PM By: Paulla Fore, RRT, CHT Signed: 01/15/2019 5:06:46 PM By: Montey Hora Entered By: Lorine Bears on 01/15/2019 13:57:37 [name] ([id number]) -------------------------------------------------------------------------------- Patient/Caregiver Education Details Patient Name: [name] Date of Service: 01/15/2019 1:45 PM Medical Record Number: [id number] Patient Account Number: [id number] Date of Birth/Gender: January 12, 1976 (43 y.o. F) Treating RN: Montey Hora Primary Care Physician: Priscille Kluver Other Clinician: Referring Physician: Priscille Kluver Treating Physician/Extender: Sharalyn Ink in Treatment: 90 Education Assessment Education Provided To: Patient Education Topics Provided Offloading: Handouts: Other: ways to offload toe Methods: Explain/Verbal Responses: State content correctly Electronic Signature(s) Signed: 01/15/2019 5:26:11 PM By: Harold Barban Entered By: Harold Barban on 01/15/2019 16:41:15 Linker, Jaida [name]. ([id number]) -------------------------------------------------------------------------------- Wound Assessment Details Patient Name: [name] Date of Service: 01/15/2019 1:45 PM Medical Record Number: [id number] Patient Account Number: [id number] Date of Birth/Sex: Nov 14, 1975 (42 y.o. F) Treating RN: Army Melia Primary Care Cortasia Screws: Priscille Kluver Other Clinician: Referring Intisar Claudio: Priscille Kluver Treating Odie Edmonds/Extender: STONE III, HOYT Weeks in Treatment: 23 Wound Status Wound Number: 5 Primary Diabetic Wound/Ulcer of the Lower Extremity Etiology: Wound Location: Left Toe Great Wound Open Wounding Event: Gradually Appeared Status: Date Acquired: 11/16/2018 Comorbid Cataracts, Glaucoma, Optic Neuritis, Chronic Weeks Of Treatment: 7 History: sinus problems/congestion, Middle ear problems, Clustered Wound: No Type II Diabetes Photos Photo Uploaded By: Army Melia on 01/15/2019 14:59:19 Wound Measurements Length: (cm) 1 Width: (cm) 1 Depth: (cm) 0.2 Area: (cm)  0.785 Volume: (cm) 0.157 % Reduction in Area: -99.7% % Reduction in Volume: 0% Epithelialization: None Tunneling: No Undermining: No Wound Description Classification: Grade 3 Foul Od Wound Margin: Epibole Slough/ Exudate Amount: Medium Exudate Type: Serous Exudate Color: amber or After Cleansing: No Fibrino No Wound Bed Granulation Amount: Small (1-33%) Exposed Structure Granulation Quality: Pink Fascia Exposed: No Necrotic Amount: Medium (34-66%) Fat Layer (Subcutaneous Tissue) Exposed: Yes Necrotic Quality: Adherent Slough Tendon Exposed: No Muscle Exposed: No Joint Exposed: No Bone Exposed: No Periwound Skin Texture Joslyn, Ritta [name]. ([id number]) Texture Color No Abnormalities Noted: No No Abnormalities Noted: No Callus: Yes Atrophie Blanche: No Crepitus: No Cyanosis: No Excoriation: No Ecchymosis: No Induration: No Erythema: Yes Rash: No Erythema Location: Circumferential Scarring: No Hemosiderin Staining: No Mottled: No Moisture Pallor: No No Abnormalities Noted: No Rubor: No Dry / Scaly: No Maceration: Yes Temperature / Pain Temperature: No Abnormality Wound Preparation Ulcer Cleansing: Rinsed/Irrigated with Saline Topical Anesthetic Applied: Other: lidocaine 4%, Treatment Notes Wound #5 (Left Toe Great) Notes Gentamicin, silver alginate, foam, conform, medipore tape. Electronic Signature(s) Signed: 01/15/2019 4:18:28 PM By: Army Melia Entered By: Army Melia on 01/15/2019 14:29:28 [name] ([id number]) -------------------------------------------------------------------------------- Wound Assessment Details Patient Name: [name] Date of Service: 01/15/2019 1:45 PM Medical Record Number: [id number] Patient Account Number: [id number] Date of Birth/Sex: Dec 11, 1975 (43 y.o. F) Treating RN: Army Melia Primary Care Shalese Strahan: Priscille Kluver Other Clinician: Referring Octavia Mottola: Priscille Kluver Treating Brexley Cutshaw/Extender: STONE III,  HOYT Weeks in Treatment: 23 Wound Status Wound Number: 6 Primary Trauma, Other Etiology: Wound Location: Left Malleolus - Medial Wound Open Wounding Event: Trauma Status: Date Acquired: 01/04/2019 Comorbid Cataracts, Glaucoma, Optic Neuritis, Chronic Weeks Of Treatment: 1 History: sinus problems/congestion, Middle ear problems, Clustered Wound: No Type II Diabetes Photos Photo Uploaded By: Army Melia on 01/15/2019 14:59:20 Wound Measurements Length: (cm) 0.2 Width: (cm) 0.2 Depth: (cm) 0.1 Area: (  cm) 0.031 Volume: (cm) 0.003 % Reduction in Area: 67% % Reduction in Volume: 66.7% Epithelialization: Small (1-33%) Tunneling: No Undermining: No Wound Description Classification: Partial Thickness Exudate Amount: None Present Foul Odor After Cleansing: No Slough/Fibrino No Wound Bed Granulation Amount: Large (67-100%) Exposed Structure Granulation Quality: Red Fascia Exposed: No Necrotic Amount: None Present (0%) Fat Layer (Subcutaneous Tissue) Exposed: Yes Tendon Exposed: No Muscle Exposed: No Joint Exposed: No Bone Exposed: No Periwound Skin Texture Texture Color No Abnormalities Noted: No No Abnormalities Noted: No Propst, Kathelyn [name]. ([id number]) Callus: No Atrophie Blanche: No Crepitus: No Cyanosis: No Excoriation: No Ecchymosis: No Induration: No Erythema: No Rash: No Hemosiderin Staining: No Scarring: No Mottled: No Pallor: No Moisture Rubor: No No Abnormalities Noted: No Dry / Scaly: No Temperature / Pain Maceration: No Temperature: No Abnormality Wound Preparation Ulcer Cleansing: Rinsed/Irrigated with Saline Topical Anesthetic Applied: None Treatment Notes Wound #6 (Left, Medial Malleolus) Notes Gentamicin, silver alginate, foam, conform, medipore tape. Electronic Signature(s) Signed: 01/15/2019 4:18:28 PM By: Army Melia Entered By: Army Melia on 01/15/2019 14:29:42 [name]  ([id number]) -------------------------------------------------------------------------------- Vitals Details Patient Name: [name] Date of Service: 01/15/2019 1:45 PM Medical Record Number: [id number] Patient Account Number: [id number] Date of Birth/Sex: [birth year] (43 y.o. F) Treating RN: Montey Hora Primary Care Bridey Brookover: Priscille Kluver Other Clinician: Referring Shondell Fabel: Priscille Kluver Treating Fenton Candee/Extender: STONE III, HOYT Weeks in Treatment: 23 Vital Signs Time Taken: 13:57 Temperature (F): 98.9 Height (in): 69 Pulse (bpm): 101 Weight (lbs): 240 Respiratory Rate (breaths/min): 18 Body Mass Index (BMI): 35.4 Blood Pressure (mmHg): 119/95 Reference Range: 80 - 120 mg / dl Electronic Signature(s) Signed: 01/15/2019 4:07:26 PM By: Lorine Bears RCP, RRT, CHT Entered By: Lorine Bears on 01/15/2019 13:59:36

## 2019-01-17 NOTE — Progress Notes (Signed)
BULA, CAVALIERI ([id number]) Visit Report for 01/15/2019 Chief Complaint Document Details Patient Name: [name], [name] Date of Service: 01/15/2019 1:45 PM Medical Record Number: [id number] Patient Account Number: [id number] Date of Birth/Sex: 1976-04-15 (43 y.o. F) Treating RN: Montey Hora Primary Care Provider: Priscille Kluver Other Clinician: Referring Provider: Priscille Kluver Treating Provider/Extender: Melburn Hake, HOYT Weeks in Treatment: 23 Information Obtained from: Patient Chief Complaint Left great toe ulcer Electronic Signature(s) Signed: 01/16/2019 11:01:46 PM By: Worthy Keeler PA-C Entered By: Worthy Keeler on 01/15/2019 14:20:29 [name] ([id number]) -------------------------------------------------------------------------------- HPI Details Patient Name: Heather Dear A. Date of Service: 01/15/2019 1:45 PM Medical Record Number: [id number] Patient Account Number: [id number] Date of Birth/Sex: [birth year] (43 y.o. F) Treating RN: Montey Hora Primary Care Provider: Priscille Kluver Other Clinician: Referring Provider: Priscille Kluver Treating Provider/Extender: Melburn Hake, HOYT Weeks in Treatment: 23 History of Present Illness HPI Description: 01/18/18-She is here for initial evaluation of the left great toe ulcer. She is a poor historian in regards to timeframe in detail. She states approximately 4 weeks ago she lacerated her toe on something in the house. She followed up with her primary care who placed her on Bactrim and ultimately a second dose of Bactrim prior to coming to wound clinic. She states she has been treating the toe with peroxide, Betadine and a Band-Aid. She did not check her blood sugar this morning but checked it yesterday morning it was 327; she is unaware of a recent A1c and there are no current records. She saw Dr. she would've orthopedics last week for an old injury to the left ankle, she states he did not see her toe, nor did she bring it to his  attention. She smokes approximately 1 pack cigarettes a day. Her social situation is concerning, she arrives this morning with her mother who appears extremely intoxicated/under the influence; her mother was asked to leave the room and be monitored by the patient's grandmother. The patient's aunt then accompanied the patient and the room throughout the rest of the appointment. We had a lengthy discussion regarding the deleterious effects of uncontrolled hyperglycemia and smoking as it relates to wound healing and overall health. She was strongly encouraged to decrease her smoking and get her diabetes under better control. She states she is currently on a diet and has cut down her [facility] consumption. The left toe is erythematous, macerated and slightly edematous with malodor present. The edema in her left foot is below her baseline, there is no erythema streaking. We will treat her with Santyl, doxycycline; we have ordered and xray, culture and provided a Peg assist surgical shoe and cultured the wound. 01/25/18-She is here in follow-up evaluation for a left great toe ulcer and presents with an abscess to her suprapubic area. She states her blood sugars remain elevated, feeling "sick" and if levels are below 250, but she is trying. She has made no attempt to decrease her smoking stating that we "can't take away her food in her cigarettes". She has been compliant with offloading using the PEG assist you. She is using Santyl daily. the culture obtained last week grew staph aureus and Enterococcus faecalis; continues on the doxycycline and Augmentin was added on Monday. The suprapubic area has erythema, no femoral variation, purple discoloration, minimal induration, was accessed with a cotton tip applicator with sanguinopurulent drainage, this was cultured, I suspect the current antibiotic treatment will cover and we will not add anything to her current treatment plan. She was advised  to go to urgent  care or ER with any change in redness, induration or fever. 02/01/18-She is here in follow-up evaluation for left great toe ulcers and a new abdominal abscess from last week. She was able to use packing until earlier this week, where she "forgot it was there". She states she was feeling ill with GI symptoms last week and was not taking her antibiotic. She states her glucose levels have been predominantly less than 200, with occasional levels between 200-250. She thinks this was contributing to her GI symptoms as they have resolved without intervention. There continues to be significant laceration to left toe, otherwise it clinically looks stable/improved. There is now less superficial opening to the lateral aspect of the great toe that was residual blister. We will transition to [facility] to all wounds, she will continue her Augmentin. If there is no change or deterioration next week for reculture. 02/08/18-She is here in follow-up evaluation for left great toe ulcer and abdominal ulcer. There is an improvement in both wounds. She has been wrapping her left toe with coban, not by our direction, which has created an area of discoloration to the medial aspect; she has been advised to NOT use coban secondary to her neuropathy. She states her glucose levels have been high over this last week ranging from 200-350, she continues to smoke. She admits to being less compliant with her offloading shoe. We will continue with same treatment plan and she will follow-up next week. 02/15/18-She is here in follow-up evaluation for left great toe ulcer and abdominal ulcer. The abdominal ulcer is epithelialized. The left great toe ulcer is improved and all injury from last week using the Coban wrap is resolved, the lateral ulcer is healed. She admits to noncompliance with wearing offloading shoe and admits to glucose levels being greater than 300 most of the week. She continues to smoke and expresses no desire to  quit. There is one area medially that probes deeper than it has historically, erythema to the toe and dorsal foot has consistently waxed and waned. There is no overt signs of cellulitis or infection but we will culture the wound for any occult infection given the new area of depth and erythema. We will hold off on sensitivities for initiation of antibiotic therapy. 02/22/18-She is here in follow up evaluation for left great toe ulcer. There is overall significant improvement in both wound appearance, erythema and edema with changes made last week. She was not initiated on antibiotic therapy. Culture obtained last week showed oxacillin sensitive staph aureus, sensitive to clindamycin. Clindamycin has been called into the pharmacy but she has been instructed to hold off on initiation secondary to overall clinical improvement and her history of antibiotic intolerance. She has been instructed to contact the clinic with any noted changes/deterioration and the wound, erythema, [name], [name] ([id number]) edema and/or pain. She will follow-up next week. She continues to smoke and her glucose levels remain elevated >250; she admits to compliance with offloading shoe 03/01/18 on evaluation today patient appears to be doing fairly well in regard to her left first toe ulcer. She has been tolerating the dressing changes with the [facility] Dressing without complication and overall this has definitely showed signs of improvement according to records as well is what the patient tells me today. I'm very pleased in that regard. She is having no pain today 03/08/18 She is here for follow up evaluation of a left great toe ulcer. She remains non-compliant with glucose control  and smoking cessation; glucose levels consistently >200. She states that she got new shoe inserts/peg assist. She admits to compliance with offloading. Since my last evaluation there is significant improvement. We will switch to prisma at this  time and she will follow up next week. She is noted to be tachycardic at this appointment, heart rate 120s; she has a history of heart rate 70-130 according to our records. She admits to extreme agitation r/t personal issues; she was advised to monitor her heartrate and contact her physician if it does not return to a more normal range (<100). She takes cardizem twice daily. 03/15/18-She is here in follow-up evaluation for left great toe ulcer. She remains noncompliant with glucose control and smoking cessation. She admits to compliance with wearing offloading shoe. The ulcer is improved/stable and we will continue with the same treatment plan and she will follow-up next week 03/22/18-She is here for evaluation for left great toe ulcer. There continues to be significant improvement despite recurrent hyperglycemia (over 500 yesterday) and she continues to smoke. She has been compliant with offloading and we will continue with same treatment plan and she will follow-up next week. 03/29/18-She is here for evaluation for left great toe ulcer. Despite continuing to smoke and uncontrolled diabetes she continues to improve. She is compliant with offloading shoe. We will continue with the same treatment plan and she will follow-up next week 04/05/18- She is here in follow up evaluation for a left great toe ulcer; she presents with small pustule to left fifth toe (resembles ant bite). She admits to compliance with wearing offloading shoe; continues to smoke or have uncontrolled blood glucose control. There is more callus than usual with evidence of bleeding; she denies known trauma. 04/12/18-She is here for evaluation of left great toe ulcer. Despite noncompliance with glycemic control and smoking she continues to make improvement. She continues to wear offloading shoe. The pustule, that was identified last week, to the left fifth toe is resolved. She will follow-up in 2 weeks 05/03/18-she is seen in follow-up  evaluation for a left great toe ulcer. She is compliant with offloading, otherwise noncompliant with glycemic control and smoking. She has plateaued and there is minimal improvement noted. We will transition to [facility], replaced the insert to her surgical shoe and she will follow-up in one week 05/10/18- She is here in follow up evaluation for a left great toe ulcer. It appears stable despite measurement change. We will continue with same treatment plan and follow up next week. 05/24/18-She is seen in follow-up evaluation for a left great toe ulcer. She remains compliant with offloading, has made significant improvement in her diet, decreasing the amount of sugar/soda. She said her recent A1c was 10.9 which is lower than. She did see a diabetic nutritionist/educator yesterday. She continues to smoke. We will continue with the same treatment plan and she'll follow-up next week. 05/31/18- She is seen in follow-up evaluation for left great toe ulcer. She continues to remain compliant with offloading, continues to make improvement in her diet, increasing her water and decreasing the amount of sugar/soda. She does continue to smoke with no desire to quit. We will apply Prisma to the depth and Hydrofera Blue over. We have not received insurance authorization for oasis. She will follow up next week. 06/07/18-She is seen in follow-up evaluation for left great toe ulcer. It has stalled according to today's measurements although base appears stable. She says she saw a diabetic educator yesterday; her average blood sugars are  less than 300 which is an improvement for her. She continues to smoke and states "that's my next step" She continues with water over soda. We will order for xray, culture and reinstate ace wrap compression prior to placing apligraf for next week. She is voicing no complaints or concerns. Her dressing will change to iodoflex over the next week in preparation for apligraf. 06/14/18-She is  seen in follow-up evaluation for left great toe ulcer. Plain film x-ray performed last week was negative for osteomyelitis. Wound culture obtained last week grew strep B and OSSA; she is initiated on keflex and cefdinir today; there is erythema to the toe which could be from ace wrap compression, she has a history of wrapping too tight and has has been encouraged to maintain ace wraps that we place today. We will hold off on application of apligraf today, will apply next week after antibiotic therapy has been initiated. She admits today that she has resumed taking a shower with her foot/toe submerged in water, she has been reminded to keep foot/toe out of the bath water. She will be seen in follow up next week 06/21/18-she is seen in follow-up evaluation for left great toe ulcer. She is tolerating antibiotic therapy with no GI disturbance. The wound is stable. Apligraf was applied today. She has been decreasing her smoking, only had 4 cigarettes yesterday and 1 today. She continues being more compliant in diabetic diet. She will follow-up next week for evaluation of site, if stable will remove at 2 weeks. 06/28/18- She is here in follow up evalution. Apligraf was placed last week, she states the dressing fell off on Tuesday and she was dressing with hydrofera blue. She is healed and will be discharged from the clinic today. She has been instructed to continue with smoking cessation, continue monitoring glucose levels, offloading for an additional 4 weeks and continue with [name], [name] ([id number]) hydrofera blue for additional two weeks for any possible microscopic opening. Readmission: 08/07/18 on evaluation today patient presents for reevaluation concerning the ulcer of her right great toe. She was previously discharged on 06/28/18 healed. Nonetheless she states that this began to show signs of drainage she subsequently went to her primary care provider. Subsequently an x-ray was performed on  08/01/18 which was negative. The patient was also placed on antibiotics at that time. Fortunately they should have been effective for the infection. Nonetheless she's been experiencing some improvement but still has a lot of drainage coming from the wound itself. 08/14/18 on evaluation today patient's wound actually does show signs of improvement in regard to the erythema at this point. She has completed the antibiotics. With that being said we did discuss the possibility of placing her in a total contact cast as of today although I think that I may want to give this just a little bit more time to ensure nothing recurrence as far as her infection is concerned. I do not want to put in the cast and risk infection at that time if things are not completely resolved. With that being said she is gonna require some debridement today. 08/21/18 on evaluation today patient actually appears to be doing okay in regard to her toe ulcer. She's been tolerating the dressing changes without complication. With that being said it does appear that she is ready and in fact I think it's appropriate for [place] to go ahead and initiate the total contact cast today. Nonetheless she will require some sharp debridement to prepare the wound for application. Overall  I feel like things have been progressing well but we do need to do something to get this to close more readily. 08/24/18 patient seen today for reevaluation after having had the total contact cast applied on Tuesday. She seems to have done very well the wound appears to be doing great and overall I'm pleased with the progress that she's made. There were no abnormal areas of rubbing from the cast on her lower extremity. 08/30/18 on evaluation today patient actually appears to be completely healed in regard to her plantar toe ulcer. She tells me at this point she's been having a lot of issues with the cast. She almost fell a couple of times the state shall the step of her  dog a couple times as well. This is been a very frustrating process for her other nonetheless she has completely healed the wound which is excellent news. Overall there does not appear to be the evidence of infection at this time which is great news. 09/11/18 evaluation today patient presents for follow-up concerning her great toe ulcer on the left which has unfortunately reopened since I last saw her which was only a couple of weeks ago. Unfortunately she was not able to get in to get the shoe and potentially the AFO that's gonna be necessary due to her left foot drop. She continues with offloading shoe but this is not enough to prevent her from reopening it appears. When we last had her in the total contact cast she did well from a healing standpoint but unfortunately the wound reopened as soon as she came out of the cast within just a couple of weeks. 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 This is also part of the reason why she falls on a regular basis. I just do not believe that is gonna be safe for [place] to reinitiate the total contact cast as last time we had this on she fell 3 times one week which is definitely not normal for her. 09/18/18 upon evaluation today the patient actually appears to be doing about the same in regard to her toe ulcer. She did not contact [employer] as I asked her to even though I had given her the prescription. In fact she actually states that she has no idea where the prescription is. She did apparently call [employer] and they told her that all she needed to do was bring the prescription in order to be able to be seen and work on getting the AFO for her left foot. With all that being said she still does not have an appointment and I'm not sure were things stand that regard.  I will give her a new prescription today in order to contact them to get this set up. 09/25/18 on evaluation today patient actually appears to be doing about the same in regard to her toes ulcer. She does have a small areas which seems to have a lot of callous buildup around the edge of the wound which is going to need sharp debridement today. She still is waiting to be scheduled for evaluation with [employer] for possibility of an AFO. She states there supposed to call her tomorrow to get this set up. Unfortunately it does appear that her foot specifically the toe area is showing signs  of erythema. There does not appear to be any systemic infection which is in these good news. 10/02/18 on evaluation today patient actually appears to be doing about the same in regard to her toe ulcer. This really has not done too well although it's not significantly larger it's also not significantly smaller. She has been tolerating the dressing changes without complication. She actually has her appointment with [employer] and [facility] tomorrow to hopefully be measured for obtaining and AFO splint. I think this would be helpful preventing this from reoccurring. We had contemplated starting the cast this week although to be honest I am reluctant to do that as she's been having nausea, vomiting, and seizure activity over the past three days. She has a history of seizures and have been told is nothing that can be done for these. With Scantlin, [name] ([id number]) that being said I do believe that along with the seizures have the nausea vomiting which upon further questioning doesn't seem to be the normal for her and makes me concerned for the possibility of infection or something else going on. I discussed this with the patient and her mother during the office visit today. I do not feel the wound is effective but maybe something else. The responses this was "this just happens to her at times and we don't know why". They did  not seem to be interested in going to the hospital to have this checked out further. 10/09/18 on evaluation today patient presents for follow-up concerning her ongoing toe ulcer. She has been tolerating the dressing changes without complication. Fortunately there does not appear to be any evidence of infection which is great news however I do think that the patient would benefit from going ahead for with the total contact cast. She's actually in a wheelchair today she tells me that she will use her walker if we initiate the cast. I was very specific about the fact that if we were gonna do the cast I wanted to make sure that she was using the walker in order to prevent any falls. She tells me she does not have stairs that she has to traverse on a regular basis at her home. She has not had any seizures since last week again that something that happens to her often she tells me she did talk to [employer] and they said that it may take up to three weeks to get the brace approved for her. Hopefully that will not take that long but nonetheless in the meantime I do think the cast could be of benefit. 10/12/18 on evaluation today patient appears to be doing rather well in regard to her toe ulcer. It's just been a few days and already this is significantly improved both as far as overall appearance and size. Fortunately there's no sign of infection. She is here for her first obligatory cast change. 10/19/18 Seen today for follow up and management of left great toe ulcer. Wound continues to show improvement. Noted small open area with seroussang drainage with palpation. Denies any increased pain or recent fevers during visit. She will continue calcium alginate with offloading shoe. Denies any questions or concerns during visit. 10/26/18 on evaluation today patient appears to be doing about the same as when I last saw her in regard to her wound bed. Fortunately there does not appear to be any signs of infection.  Unfortunately she continues to have a breakdown in regard to the toe region any time that she is not in the cast.  It takes almost no time at all for this to happen. Nonetheless she still has not heard anything from the brace being made by [employer] as to when exactly this will be available to her. Fortunately there is no signs of infection at this time. 10/30/18 on evaluation today patient presents for application of the total contact cast as we just received him this morning. Fortunately we are gonna be able to apply this to her today which is great news. She continues to have no significant pain which is good news. Overall I do feel like things have been improving while she was the cast is when she doesn't have a cast that things get worse. She still has not really heard anything from [facility] regarding her brace. 11/02/18 upon evaluation today patient's wound already appears to be doing significantly better which is good news. Fortunately there does not appear to be any signs of infection also good news. Overall I do think the total contact cast as before is helping to heal this area unfortunately it's just not gonna likely keep the area closed and healed without her getting her brace at least. Again the foot drop is a significant issue for her. 11/09/18 on evaluation today patient appears to be doing excellent in regard to her toe ulcer which in fact is completely healed. Fortunately we finally got the situation squared away with the paperwork which was needed to proceed with getting her brace approved by Medicaid. I have filled that out unfortunately that information has been sent to the orthopedic office that I worked at 2 1/2 years ago and not tired Current wound care measures. Fortunately she seems to be doing very well at this time. 11/23/18 on evaluation today patient appears to be doing More Poorly Compared to Last Time I Saw Her. At [facility] She Had Completely Healed. Currently she is  continuing to have issues with reopening. She states that she just found out that the brace was approved through Medicaid now she just has to go get measured in order to have this fitted for her and then made. Subsequently she does not have an appointment for this yet that is going to complicate things we obviously cannot put her back in the cast if we do not have everything measured because they're not gonna be able to measure her foot while she is in the cast. 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 Obviously this is unfortunate and does have me somewhat worried as well. 11/30/18 on evaluation today patient's toe ulcer actually appears to be doing fairly well. The good news is she will be getting her brace in the shoes next week on Wednesday. Hopefully we will be able to get this to heal without having to go back in the cast however she may need the cast in order to get the wound completely heal and then go from there. Fortunately there's no signs of infection at this time. 12/07/18 on evaluation today patient fortunately did receive her brace and she states she could tell this definitely makes her walk better. With that being said she's been having issues with her toe where she noticed yesterday there was a lot of tissue [name], [name] ([id number]) that was loosing off this appears to be much larger than what it was previous. She also states that her leg has been read putting much across the top of  her foot just about the ankle although this seems to be receiving somewhat. The total area is still red and appears to be someone infected as best I can tell. She is previously taken Bactrim and that may be a good option for her today as well. We are gonna see what I wound culture shows as well and I think that this is definitely appropriate. With that being said outside of the culture I still need to  initiate something in the interim and that's what I'm gonna go ahead and select Bactrim is a good option for her. 12/14/18 on evaluation today patient appears to be doing better in regard to her left great toe ulcer as compared to last week's evaluation. There's still some erythema although this is significantly improved which is excellent news. Overall I do believe that she is making good progress is still gonna take some time before she is where I would like her to be from the standpoint of being able to place her back into the total contact cast. Hopefully we will be where we need to be by next week. 12/21/18 on evaluation today patient actually appears to be doing poorly in regard to her toe ulcer. She's been tolerating the dressing changes without complication. Fortunately there's no signs of systemic infection although she does have a lot of drainage from the toe ulcer and this does seem to be causing some issues at this point. She does have erythema on the distal portion of her toe that appears to be likely cellulitis. 12/28/18 on evaluation today patient actually appears to be doing a little better in my pinion in regard to her toe ulcer. With that being said she still does have some evidence of infection at this time and for her culture she had both E. coli as well as enterococcus as organisms noted on evaluation. For that reason I think that though the Keflex likely has treated the E. coli rather well this has really done nothing for the enterococcus. We are going to have to initiate treatment for this specifically. 01/04/19 on evaluation today patient's toe actually appears to be doing better from the standpoint of infection. She currently would like to see about putting the cash back on I think that this is appropriate as long as she takes care of it and keeps it from getting wet. She is gonna have some drainage we can definitely pass this up with Drawtex and alginate to try to prevent as  much drainage as possible from causing the problems. With that being said I do want to at least try her with the cast between now and Tuesday. If there any issues we can't continue to use it then I will discontinue the use of the cast at that point. 01/08/19 on evaluation today patient actually appears to be doing very well as far as her foot ulcer specifically the great toe on the left is concerned. She did have an area of rubbing on the medial aspect of her left ankle which again is from the cast. Fortunately there's no signs of infection at this point in this appears to be a very slight skin breakdown. The patient tells me she felt it rubbing but didn't think it was that bad. Fortunately there is no signs of active infection at this time which is good news. No fevers, chills, nausea, or vomiting noted at this time. 01/15/19 on evaluation today patient actually appears to be doing well in regard to her toe ulcer. Again as  previous she seems to do well and she has the cast on which indicates to me that during the time she doesn't have a cast on she's putting way too much pressure on this region. Obviously I think that's gonna be an issue as with the current national emergency concerning the Covid-19 Virus it has been recommended that we discontinue the use of total contact casting by the chief medical officer of our company, Dr. Simona Huh. The reasoning is that if a patient becomes sick and cannot come into have the cast removed they could not just leave this on for an additional two weeks. Obviously the hospitals also do not want to receive patient's who are sick into the emergency department to potentially contaminate the region and spread the Covid-19 Virus among other sick individuals within the hospital system. Therefore at this point we are suspending the use of total contact cast until the current emergency subsides. This was all discussed with the patient today as well. Electronic  Signature(s) Signed: 01/16/2019 11:01:46 PM By: Worthy Keeler PA-C Entered By: Worthy Keeler on 01/16/2019 22:39:16 [name] ([id number]) -------------------------------------------------------------------------------- Physical Exam Details Patient Name: Heather Dear A. Date of Service: 01/15/2019 1:45 PM Medical Record Number: [id number] Patient Account Number: [id number] Date of Birth/Sex: Nov 17, 1975 (43 y.o. F) Treating RN: Montey Hora Primary Care Provider: Priscille Kluver Other Clinician: Referring Provider: Priscille Kluver Treating Provider/Extender: STONE III, HOYT Weeks in Treatment: 17 Constitutional Well-nourished and well-hydrated in no acute distress. Respiratory normal breathing without difficulty. clear to auscultation bilaterally. Cardiovascular regular rate and rhythm with normal S1, S2. Psychiatric this patient is able to make decisions and demonstrates good insight into disease process. Alert and Oriented x 3. pleasant and cooperative. Notes Patient's wound bed did not require sharp debridement today and appears to be doing very well which is good news. Fortunately she does seem to be tolerating the dressing changes without complication she also has her brace for her foot I think she's gonna just have to take it easy and not do much over the next several weeks to allow this area to heal. This is going to be completely up to the patient the more she stays off of it the better it can and will be healed. Electronic Signature(s) Signed: 01/16/2019 11:01:46 PM By: Worthy Keeler PA-C Entered By: Worthy Keeler on 01/16/2019 22:39:57 [name] ([id number]) -------------------------------------------------------------------------------- Physician Orders Details Patient Name: Heather Dear A. Date of Service: 01/15/2019 1:45 PM Medical Record Number: [id number] Patient Account Number: [id number] Date of Birth/Sex: 1976-09-08 (43 y.o. 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 Wound #6 Left,Medial Malleolus o Clean wound with Normal Saline. Anesthetic (add to Medication List) Wound #5 Left Toe Great o Topical Lidocaine 4% cream applied to wound bed prior to debridement (In Clinic Only). Wound #6 Left,Medial Malleolus o Topical Lidocaine 4% cream applied to wound bed prior to debridement (In Clinic Only). Primary Wound Dressing Wound #5 Left Toe Great o Silver Alginate - Gentamicin on wound bed Secondary Dressing Wound #5 Left Toe Great o Other - bandaid or coverlet Dressing Change  Frequency Wound #5 Left Toe Great o Three times weekly Wound #6 Left,Medial Malleolus o Three times weekly Follow-up Appointments Wound #5 Left Toe Great o Return Appointment in 1 week. Off-Loading Wound #5 Left Toe Emillie Chasen, Elinora A. ([id number]) o Open toe surgical shoe to: - Wear your shoe with your brace Medications-please add to medication list. Wound #5 Left Toe Great o Topical Antibiotic Electronic Signature(s) Signed: 01/15/2019 5:06:46 PM By: Montey Hora Signed: 01/16/2019 11:01:46 PM By: Worthy Keeler PA-C Entered By: Montey Hora on 01/15/2019 14:59:45 [name], [name] ([id number]) -------------------------------------------------------------------------------- Problem List Details Patient Name: Heather Dear A. Date of Service: 01/15/2019 1:45 PM Medical Record Number: [id number] Patient Account Number: [id number] Date of Birth/Sex: [birth year] (43  y.o. 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 ([id number]) -------------------------------------------------------------------------------- Progress Note Details Patient Name: Heather Dear A. Date of Service: 01/15/2019 1:45 PM Medical Record Number: [id number] Patient Account Number: [id number] Date of Birth/Sex: 09/02/1976 (43 y.o. F) Treating RN: Montey Hora Primary Care Provider: Priscille Kluver Other Clinician: Referring Provider: Priscille Kluver Treating Provider/Extender: Melburn Hake, HOYT Weeks in Treatment: 23 Subjective Chief Complaint Information obtained from Patient Left great toe ulcer History of Present Illness (HPI) 01/18/18-She is here for initial evaluation of the left great toe ulcer. She is a poor historian in regards to timeframe in detail. She states approximately 4 weeks ago she lacerated her toe on something in the house. She followed up with her primary care who placed her on Bactrim and ultimately a second dose of Bactrim prior to coming to wound clinic. She states she has been treating the toe with peroxide, Betadine and a Band-Aid. She did not check her blood sugar this morning but checked it yesterday morning it was 327; she is  unaware of a recent A1c and there are no current records. She saw Dr. she would've orthopedics last week for an old injury to the left ankle, she states he did not see her toe, nor did she bring it to his attention. She smokes approximately 1 pack cigarettes a day. Her social situation is concerning, she arrives this morning with her mother who appears extremely intoxicated/under the influence; her mother was asked to leave the room and be monitored by the patient's grandmother. The patient's aunt then accompanied the patient and the room throughout the rest of the appointment. We had a lengthy discussion regarding the deleterious effects of uncontrolled hyperglycemia and smoking as it relates to wound healing and overall health. She was strongly encouraged to decrease her smoking and get her diabetes under better control. She states she is currently on a diet and has cut down her [facility] consumption. The left toe is erythematous, macerated and slightly edematous with malodor present. The edema in her left foot is below her baseline, there is no erythema streaking. We will treat her with Santyl, doxycycline; we have ordered and xray, culture  and provided a Peg assist surgical shoe and cultured the wound. 01/25/18-She is here in follow-up evaluation for a left great toe ulcer and presents with an abscess to her suprapubic area. She states her blood sugars remain elevated, feeling "sick" and if levels are below 250, but she is trying. She has made no attempt to decrease her smoking stating that we "can't take away her food in her cigarettes". She has been compliant with offloading using the PEG assist you. She is using Santyl daily. the culture obtained last week grew staph aureus and Enterococcus faecalis; continues on the doxycycline and Augmentin was added on Monday. The suprapubic area has erythema, no femoral variation, purple discoloration, minimal induration, was accessed with a cotton tip  applicator with sanguinopurulent drainage, this was cultured, I suspect the current antibiotic treatment will cover and we will not add anything to her current treatment plan. She was advised to go to urgent care or ER with any change in redness, induration or fever. 02/01/18-She is here in follow-up evaluation for left great toe ulcers and a new abdominal abscess from last week. She was able to use packing until earlier this week, where she "forgot it was there". She states she was feeling ill with GI symptoms last week and was not taking her antibiotic. She states her glucose levels have been predominantly less than 200, with occasional levels between 200-250. She thinks this was contributing to her GI symptoms as they have resolved without intervention. There continues to be significant laceration to left toe, otherwise it clinically looks stable/improved. There is now less superficial opening to the lateral aspect of the great toe that was residual blister. We will transition to [facility] to all wounds, she will continue her Augmentin. If there is no change or deterioration next week for reculture. 02/08/18-She is here in follow-up evaluation for left great toe ulcer and abdominal ulcer. There is an improvement in both wounds. She has been wrapping her left toe with coban, not by our direction, which has created an area of discoloration to the medial aspect; she has been advised to NOT use coban secondary to her neuropathy. She states her glucose levels have been high over this last week ranging from 200-350, she continues to smoke. She admits to being less compliant with her offloading shoe. We will continue with same treatment plan and she will follow-up next week. 02/15/18-She is here in follow-up evaluation for left great toe ulcer and abdominal ulcer. The abdominal ulcer is epithelialized. The left great toe ulcer is improved and all injury from last week using the Coban wrap is resolved,  the lateral ulcer is healed. She admits to noncompliance with wearing offloading shoe and admits to glucose levels being greater than 300 most of the week. She continues to smoke and expresses no desire to quit. There is one area medially that probes deeper than it has historically, erythema to the toe and dorsal foot has consistently waxed and waned. There is no overt signs of cellulitis or infection but we will culture the wound for any occult infection given the new area of depth and erythema. We will hold off on Michalsky, Malayjah A. ([id number]) sensitivities for initiation of antibiotic therapy. 02/22/18-She is here in follow up evaluation for left great toe ulcer. There is overall significant improvement in both wound appearance, erythema and edema with changes made last week. She was not initiated on antibiotic therapy. Culture obtained last week showed oxacillin sensitive staph aureus, sensitive to clindamycin. Clindamycin  has been called into the pharmacy but she has been instructed to hold off on initiation secondary to overall clinical improvement and her history of antibiotic intolerance. She has been instructed to contact the clinic with any noted changes/deterioration and the wound, erythema, edema and/or pain. She will follow-up next week. She continues to smoke and her glucose levels remain elevated >250; she admits to compliance with offloading shoe 03/01/18 on evaluation today patient appears to be doing fairly well in regard to her left first toe ulcer. She has been tolerating the dressing changes with the [facility] Dressing without complication and overall this has definitely showed signs of improvement according to records as well is what the patient tells me today. I'm very pleased in that regard. She is having no pain today 03/08/18 She is here for follow up evaluation of a left great toe ulcer. She remains non-compliant with glucose control and smoking cessation; glucose levels  consistently >200. She states that she got new shoe inserts/peg assist. She admits to compliance with offloading. Since my last evaluation there is significant improvement. We will switch to prisma at this time and she will follow up next week. She is noted to be tachycardic at this appointment, heart rate 120s; she has a history of heart rate 70-130 according to our records. She admits to extreme agitation r/t personal issues; she was advised to monitor her heartrate and contact her physician if it does not return to a more normal range (<100). She takes cardizem twice daily. 03/15/18-She is here in follow-up evaluation for left great toe ulcer. She remains noncompliant with glucose control and smoking cessation. She admits to compliance with wearing offloading shoe. The ulcer is improved/stable and we will continue with the same treatment plan and she will follow-up next week 03/22/18-She is here for evaluation for left great toe ulcer. There continues to be significant improvement despite recurrent hyperglycemia (over 500 yesterday) and she continues to smoke. She has been compliant with offloading and we will continue with same treatment plan and she will follow-up next week. 03/29/18-She is here for evaluation for left great toe ulcer. Despite continuing to smoke and uncontrolled diabetes she continues to improve. She is compliant with offloading shoe. We will continue with the same treatment plan and she will follow-up next week 04/05/18- She is here in follow up evaluation for a left great toe ulcer; she presents with small pustule to left fifth toe (resembles ant bite). She admits to compliance with wearing offloading shoe; continues to smoke or have uncontrolled blood glucose control. There is more callus than usual with evidence of bleeding; she denies known trauma. 04/12/18-She is here for evaluation of left great toe ulcer. Despite noncompliance with glycemic control and smoking she continues  to make improvement. She continues to wear offloading shoe. The pustule, that was identified last week, to the left fifth toe is resolved. She will follow-up in 2 weeks 05/03/18-she is seen in follow-up evaluation for a left great toe ulcer. She is compliant with offloading, otherwise noncompliant with glycemic control and smoking. She has plateaued and there is minimal improvement noted. We will transition to [facility], replaced the insert to her surgical shoe and she will follow-up in one week 05/10/18- She is here in follow up evaluation for a left great toe ulcer. It appears stable despite measurement change. We will continue with same treatment plan and follow up next week. 05/24/18-She is seen in follow-up evaluation for a left great toe ulcer. She remains  compliant with offloading, has made significant improvement in her diet, decreasing the amount of sugar/soda. She said her recent A1c was 10.9 which is lower than. She did see a diabetic nutritionist/educator yesterday. She continues to smoke. We will continue with the same treatment plan and she'll follow-up next week. 05/31/18- She is seen in follow-up evaluation for left great toe ulcer. She continues to remain compliant with offloading, continues to make improvement in her diet, increasing her water and decreasing the amount of sugar/soda. She does continue to smoke with no desire to quit. We will apply Prisma to the depth and Hydrofera Blue over. We have not received insurance authorization for oasis. She will follow up next week. 06/07/18-She is seen in follow-up evaluation for left great toe ulcer. It has stalled according to today's measurements although base appears stable. She says she saw a diabetic educator yesterday; her average blood sugars are less than 300 which is an improvement for her. She continues to smoke and states "that's my next step" She continues with water over soda. We will order for xray, culture and reinstate  ace wrap compression prior to placing apligraf for next week. She is voicing no complaints or concerns. Her dressing will change to iodoflex over the next week in preparation for apligraf. 06/14/18-She is seen in follow-up evaluation for left great toe ulcer. Plain film x-ray performed last week was negative for osteomyelitis. Wound culture obtained last week grew strep B and OSSA; she is initiated on keflex and cefdinir today; there is erythema to the toe which could be from ace wrap compression, she has a history of wrapping too tight and has has been encouraged to maintain ace wraps that we place today. We will hold off on application of apligraf today, will apply next week after antibiotic therapy has been initiated. She admits today that she has resumed taking a shower with her foot/toe submerged in water, she has been reminded to keep foot/toe out of the bath water. She will be seen in follow up next week 06/21/18-she is seen in follow-up evaluation for left great toe ulcer. She is tolerating antibiotic therapy with no GI disturbance. The wound is stable. Apligraf was applied today. She has been decreasing her smoking, only had 4 cigarettes yesterday and [name], [name] ([id number]) 1 today. She continues being more compliant in diabetic diet. She will follow-up next week for evaluation of site, if stable will remove at 2 weeks. 06/28/18- She is here in follow up evalution. Apligraf was placed last week, she states the dressing fell off on Tuesday and she was dressing with hydrofera blue. She is healed and will be discharged from the clinic today. She has been instructed to continue with smoking cessation, continue monitoring glucose levels, offloading for an additional 4 weeks and continue with hydrofera blue for additional two weeks for any possible microscopic opening. Readmission: 08/07/18 on evaluation today patient presents for reevaluation concerning the ulcer of her right great toe. She  was previously discharged on 06/28/18 healed. Nonetheless she states that this began to show signs of drainage she subsequently went to her primary care provider. Subsequently an x-ray was performed on 08/01/18 which was negative. The patient was also placed on antibiotics at that time. Fortunately they should have been effective for the infection. Nonetheless she's been experiencing some improvement but still has a lot of drainage coming from the wound itself. 08/14/18 on evaluation today patient's wound actually does show signs of improvement in regard to the erythema at  this point. She has completed the antibiotics. With that being said we did discuss the possibility of placing her in a total contact cast as of today although I think that I may want to give this just a little bit more time to ensure nothing recurrence as far as her infection is concerned. I do not want to put in the cast and risk infection at that time if things are not completely resolved. With that being said she is gonna require some debridement today. 08/21/18 on evaluation today patient actually appears to be doing okay in regard to her toe ulcer. She's been tolerating the dressing changes without complication. With that being said it does appear that she is ready and in fact I think it's appropriate for [place] to go ahead and initiate the total contact cast today. Nonetheless she will require some sharp debridement to prepare the wound for application. Overall I feel like things have been progressing well but we do need to do something to get this to close more readily. 08/24/18 patient seen today for reevaluation after having had the total contact cast applied on Tuesday. She seems to have done very well the wound appears to be doing great and overall I'm pleased with the progress that she's made. There were no abnormal areas of rubbing from the cast on her lower extremity. 08/30/18 on evaluation today patient actually appears to  be completely healed in regard to her plantar toe ulcer. She tells me at this point she's been having a lot of issues with the cast. She almost fell a couple of times the state shall the step of her dog a couple times as well. This is been a very frustrating process for her other nonetheless she has completely healed the wound which is excellent news. Overall there does not appear to be the evidence of infection at this time which is great news. 09/11/18 evaluation today patient presents for follow-up concerning her great toe ulcer on the left which has unfortunately reopened since I last saw her which was only a couple of weeks ago. Unfortunately she was not able to get in to get the shoe and potentially the AFO that's gonna be necessary due to her left foot drop. She continues with offloading shoe but this is not enough to prevent her from reopening it appears. When we last had her in the total contact cast she did well from a healing standpoint but unfortunately the wound reopened as soon as she came out of the cast within just a couple of weeks. 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 This is also part of the reason why she falls on a regular basis. I just do not believe that is gonna be safe for [place] to reinitiate the total contact cast as last time we had this on she fell 3 times one week which is definitely not normal for her. 09/18/18 upon evaluation today the patient actually appears to be doing about the same in regard to her toe ulcer. She did not contact [facility] as I asked her to even though I had given her the prescription. In fact she actually states that she has no idea where the prescription is. She did apparently call [facility] and they told her that all she needed  to do was bring  the prescription in order to be able to be seen and work on getting the AFO for her left foot. With all that being said she still does not have an appointment and I'm not sure were things stand that regard. I will give her a new prescription today in order to contact them to get this set up. 09/25/18 on evaluation today patient actually appears to be doing about the same in regard to her toes ulcer. She does have a small areas which seems to have a lot of callous buildup around the edge of the wound which is going to need sharp debridement today. She still is waiting to be scheduled for evaluation with [facility] for possibility of an AFO. She states there supposed to call her tomorrow to get this set up. Unfortunately it does appear that her foot specifically the toe area is showing signs of erythema. There does not appear to be any systemic infection which is in these good news. [name], [name] ([id number]) 10/02/18 on evaluation today patient actually appears to be doing about the same in regard to her toe ulcer. This really has not done too well although it's not significantly larger it's also not significantly smaller. She has been tolerating the dressing changes without complication. She actually has her appointment with [facility] and [place] tomorrow to hopefully be measured for obtaining and AFO splint. I think this would be helpful preventing this from reoccurring. We had contemplated starting the cast this week although to be honest I am reluctant to do that as she's been having nausea, vomiting, and seizure activity over the past three days. She has a history of seizures and have been told is nothing that can be done for these. With that being said I do believe that along with the seizures have the nausea vomiting which upon further questioning doesn't seem to be the normal for her and makes me concerned for the possibility of infection or something else going on. I discussed this with the  patient and her mother during the office visit today. I do not feel the wound is effective but maybe something else. The responses this was "this just happens to her at times and we don't know why". They did not seem to be interested in going to the hospital to have this checked out further. 10/09/18 on evaluation today patient presents for follow-up concerning her ongoing toe ulcer. She has been tolerating the dressing changes without complication. Fortunately there does not appear to be any evidence of infection which is great news however I do think that the patient would benefit from going ahead for with the total contact cast. She's actually in a wheelchair today she tells me that she will use her walker if we initiate the cast. I was very specific about the fact that if we were gonna do the cast I wanted to make sure that she was using the walker in order to prevent any falls. She tells me she does not have stairs that she has to traverse on a regular basis at her home. She has not had any seizures since last week again that something that happens to her often she tells me she did talk to [employer] and they said that it may take up to three weeks to get the brace approved for her. Hopefully that will not take that long but nonetheless in the meantime I do think the cast could be of benefit. 10/12/18 on evaluation today patient appears  to be doing rather well in regard to her toe ulcer. It's just been a few days and already this is significantly improved both as far as overall appearance and size. Fortunately there's no sign of infection. She is here for her first obligatory cast change. 10/19/18 Seen today for follow up and management of left great toe ulcer. Wound continues to show improvement. Noted small open area with seroussang drainage with palpation. Denies any increased pain or recent fevers during visit. She will continue calcium alginate with offloading shoe. Denies any questions or  concerns during visit. 10/26/18 on evaluation today patient appears to be doing about the same as when I last saw her in regard to her wound bed. Fortunately there does not appear to be any signs of infection. Unfortunately she continues to have a breakdown in regard to the toe region any time that she is not in the cast. It takes almost no time at all for this to happen. Nonetheless she still has not heard anything from the brace being made by [employer] as to when exactly this will be available to her. Fortunately there is no signs of infection at this time. 10/30/18 on evaluation today patient presents for application of the total contact cast as we just received him this morning. Fortunately we are gonna be able to apply this to her today which is great news. She continues to have no significant pain which is good news. Overall I do feel like things have been improving while she was the cast is when she doesn't have a cast that things get worse. She still has not really heard anything from [facility] regarding her brace. 11/02/18 upon evaluation today patient's wound already appears to be doing significantly better which is good news. Fortunately there does not appear to be any signs of infection also good news. Overall I do think the total contact cast as before is helping to heal this area unfortunately it's just not gonna likely keep the area closed and healed without her getting her brace at least. Again the foot drop is a significant issue for her. 11/09/18 on evaluation today patient appears to be doing excellent in regard to her toe ulcer which in fact is completely healed. Fortunately we finally got the situation squared away with the paperwork which was needed to proceed with getting her brace approved by Medicaid. I have filled that out unfortunately that information has been sent to the orthopedic office that I worked at 2 1/2 years ago and not tired Current wound care measures. Fortunately she  seems to be doing very well at this time. 11/23/18 on evaluation today patient appears to be doing More Poorly Compared to Last Time I Saw Her. At [facility] She Had Completely Healed. Currently she is continuing to have issues with reopening. She states that she just found out that the brace was approved through Medicaid now she just has to go get measured in order to have this fitted for her and then made. Subsequently she does not have an appointment for this yet that is going to complicate things we obviously cannot put her back in the cast if we do not have everything measured because they're not gonna be able to measure her foot while she is in the cast. 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 Obviously this is unfortunate and does have me somewhat worried as  well. 11/30/18 on evaluation today patient's toe ulcer actually appears to be doing fairly well. The good news is she will be getting her Bartley, Jaeleigh A. ([id number]) brace in the shoes next week on Wednesday. Hopefully we will be able to get this to heal without having to go back in the cast however she may need the cast in order to get the wound completely heal and then go from there. Fortunately there's no signs of infection at this time. 12/07/18 on evaluation today patient fortunately did receive her brace and she states she could tell this definitely makes her walk better. With that being said she's been having issues with her toe where she noticed yesterday there was a lot of tissue that was loosing off this appears to be much larger than what it was previous. She also states that her leg has been read putting much across the top of her foot just about the ankle although this seems to be receiving somewhat. The total area is still red and appears to be someone infected as best I can tell. She is previously taken Bactrim and that may  be a good option for her today as well. We are gonna see what I wound culture shows as well and I think that this is definitely appropriate. With that being said outside of the culture I still need to initiate something in the interim and that's what I'm gonna go ahead and select Bactrim is a good option for her. 12/14/18 on evaluation today patient appears to be doing better in regard to her left great toe ulcer as compared to last week's evaluation. There's still some erythema although this is significantly improved which is excellent news. Overall I do believe that she is making good progress is still gonna take some time before she is where I would like her to be from the standpoint of being able to place her back into the total contact cast. Hopefully we will be where we need to be by next week. 12/21/18 on evaluation today patient actually appears to be doing poorly in regard to her toe ulcer. She's been tolerating the dressing changes without complication. Fortunately there's no signs of systemic infection although she does have a lot of drainage from the toe ulcer and this does seem to be causing some issues at this point. She does have erythema on the distal portion of her toe that appears to be likely cellulitis. 12/28/18 on evaluation today patient actually appears to be doing a little better in my pinion in regard to her toe ulcer. With that being said she still does have some evidence of infection at this time and for her culture she had both E. coli as well as enterococcus as organisms noted on evaluation. For that reason I think that though the Keflex likely has treated the E. coli rather well this has really done nothing for the enterococcus. We are going to have to initiate treatment for this specifically. 01/04/19 on evaluation today patient's toe actually appears to be doing better from the standpoint of infection. She currently would like to see about putting the cash back on I think  that this is appropriate as long as she takes care of it and keeps it from getting wet. She is gonna have some drainage we can definitely pass this up with Drawtex and alginate to try to prevent as much drainage as possible from causing the problems. With that being said I do want to at least try her  with the cast between now and Tuesday. If there any issues we can't continue to use it then I will discontinue the use of the cast at that point. 01/08/19 on evaluation today patient actually appears to be doing very well as far as her foot ulcer specifically the great toe on the left is concerned. She did have an area of rubbing on the medial aspect of her left ankle which again is from the cast. Fortunately there's no signs of infection at this point in this appears to be a very slight skin breakdown. The patient tells me she felt it rubbing but didn't think it was that bad. Fortunately there is no signs of active infection at this time which is good news. No fevers, chills, nausea, or vomiting noted at this time. 01/15/19 on evaluation today patient actually appears to be doing well in regard to her toe ulcer. Again as previous she seems to do well and she has the cast on which indicates to me that during the time she doesn't have a cast on she's putting way too much pressure on this region. Obviously I think that's gonna be an issue as with the current national emergency concerning the Covid-19 Virus it has been recommended that we discontinue the use of total contact casting by the chief medical officer of our company, Dr. Simona Huh. The reasoning is that if a patient becomes sick and cannot come into have the cast removed they could not just leave this on for an additional two weeks. Obviously the hospitals also do not want to receive patient's who are sick into the emergency department to potentially contaminate the region and spread the Covid-19 Virus among other sick individuals within the hospital  system. Therefore at this point we are suspending the use of total contact cast until the current emergency subsides. This was all discussed with the patient today as well. Patient History Information obtained from Patient. Social History Current every day smoker. Medical History Eyes Stradley, Anzlee A. ([id number]) Patient has history of Cataracts, Glaucoma, Optic Neuritis Ear/Nose/Mouth/Throat Patient has history of Chronic sinus problems/congestion, Middle ear problems Endocrine Patient has history of Type II Diabetes Review of Systems (ROS) Constitutional Symptoms (General Health) Denies complaints or symptoms of Fever, Chills. Respiratory The patient has no complaints or symptoms. Cardiovascular The patient has no complaints or symptoms. Psychiatric The patient has no complaints or symptoms. Objective Constitutional Well-nourished and well-hydrated in no acute distress. Vitals Time Taken: 1:57 PM, Height: 69 in, Weight: 240 lbs, BMI: 35.4, Temperature: 98.9 F, Pulse: 101 bpm, Respiratory Rate: 18 breaths/min, Blood Pressure: 119/95 mmHg. Respiratory normal breathing without difficulty. clear to auscultation bilaterally. Cardiovascular regular rate and rhythm with normal S1, S2. Psychiatric this patient is able to make decisions and demonstrates good insight into disease process. Alert and Oriented x 3. pleasant and cooperative. General Notes: Patient's wound bed did not require sharp debridement today and appears to be doing very well which is good news. Fortunately she does seem to be tolerating the dressing changes without complication she also has her brace for her foot I think she's gonna just have to take it easy and not do much over the next several weeks to allow this area to heal. This is going to be completely up to the patient the more she stays off of it the better it can and will be healed. Integumentary (Hair, Skin) Wound #5 status is Open. Original cause of  wound was Gradually Appeared. The wound is located on the  Left Toe Great. The wound measures 1cm length x 1cm width x 0.2cm depth; 0.785cm^2 area and 0.157cm^3 volume. There is Fat Layer (Subcutaneous Tissue) Exposed exposed. There is no tunneling or undermining noted. There is a medium amount of serous drainage noted. The wound margin is epibole. There is small (1-33%) pink granulation within the wound bed. There is a medium (34-66%) amount of necrotic tissue within the wound bed including Adherent Slough. The periwound skin appearance exhibited: Callus, Maceration, Erythema. The periwound skin appearance did not exhibit: Crepitus, Excoriation, Induration, Rash, Scarring, Dry/Scaly, Atrophie Blanche, Cyanosis, Ecchymosis, Hemosiderin Staining, Mottled, Pallor, Rubor. The surrounding wound skin color is noted with erythema which is circumferential. Periwound temperature was noted as No Abnormality. Car, [place] ([id number]) Wound #6 status is Open. Original cause of wound was Trauma. The wound is located on the Left,Medial Malleolus. The wound measures 0.2cm length x 0.2cm width x 0.1cm depth; 0.031cm^2 area and 0.003cm^3 volume. There is Fat Layer (Subcutaneous Tissue) Exposed exposed. There is no tunneling or undermining noted. There is a none present amount of drainage noted. There is large (67-100%) red granulation within the wound bed. There is no necrotic tissue within the wound bed. The periwound skin appearance did not exhibit: Callus, Crepitus, Excoriation, Induration, Rash, Scarring, Dry/Scaly, Maceration, Atrophie Blanche, Cyanosis, Ecchymosis, Hemosiderin Staining, Mottled, Pallor, Rubor, Erythema. Periwound temperature was noted as No Abnormality. 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 Wound #6 Left,Medial Malleolus: Clean wound with Normal Saline. Anesthetic (add to Medication List): Wound #5 Left Toe Great: Topical Lidocaine 4% cream applied to wound bed prior to debridement (In Clinic Only). Wound #6 Left,Medial Malleolus: Topical Lidocaine 4% cream applied to wound bed prior to debridement (In Clinic Only). Primary Wound Dressing: Wound #5 Left Toe Great: Silver Alginate - Gentamicin on wound bed Secondary Dressing: Wound #5 Left Toe Great: Other - bandaid or coverlet Dressing Change Frequency: Wound #5 Left Toe Great: Three times weekly Wound #6 Left,Medial Malleolus: Three times weekly Follow-up Appointments: Wound #5 Left Toe Great: Return Appointment in 1 week. Off-Loading: Wound #5 Left Toe Great: Open toe surgical shoe to: - Wear your shoe with your brace Medications-please add to medication list.: Wound #5 Left Toe Great: Topical Antibiotic [name], [name] ([id number]) My suggestion at this point again is gonna be that the patient diligently offload this area only doing the essential tasks that she absolutely has to do over the next several weeks to allow this to completely healed. If she does so it will heal. Nonethele we will subsequently see the patient back for reevaluation if she does not there could be a risk of worsening of the wound and obviously repeat infection. While the total contact cast is the best method of offloading right now it's not really the safest in light of the current Covid-19 Virus national emergency. Please see above for specific wound care orders. We will see patient for re-evaluation in 1 week(s) here in the clinic. If anything worsens or changes patient will contact our office for additional recommendations. Electronic Signature(s) Signed: 01/16/2019 11:01:46 PM By: Worthy Keeler PA-C Entered By: Worthy Keeler on 01/16/2019 22:40:11 [name]  ([id number]) -------------------------------------------------------------------------------- ROS/PFSH Details Patient Name: Heather Dear A. Date of Service: 01/15/2019 1:45 PM Medical Record Number: [id number] Patient Account Number:  [id number] Date of Birth/Sex: [birth year] (43 y.o. F) Treating RN: Montey Hora Primary Care Provider: Priscille Kluver Other Clinician: Referring Provider: Priscille Kluver Treating Provider/Extender: STONE III, HOYT Weeks in Treatment: 23 Information Obtained From Patient Wound History Do you currently have one or more open woundso Yes How many open wounds do you currently haveo 1 Constitutional Symptoms (General Health) Complaints and Symptoms: Negative for: Fever; Chills Eyes Medical History: Positive for: Cataracts; Glaucoma; Optic Neuritis Ear/Nose/Mouth/Throat Medical History: Positive for: Chronic sinus problems/congestion; Middle ear problems Respiratory Complaints and Symptoms: No Complaints or Symptoms Cardiovascular Complaints and Symptoms: No Complaints or Symptoms Endocrine Medical History: Positive for: Type II Diabetes Time with diabetes: 10 years Treated with: Insulin, Oral agents Blood sugar tested every day: No Blood sugar testing results: Lunch: 350 Psychiatric Complaints and Symptoms: No Complaints or Symptoms HBO Extended History Items Minish, Stanislawa A. ([id number]) Eyes: Eyes: Ear/Nose/Mouth/Throat: Ear/Nose/Mouth/Throat: Cataracts Glaucoma Chronic sinus Middle ear problems problems/congestion Immunizations Pneumococcal Vaccine: Received Pneumococcal Vaccination: No Implantable Devices No devices added Family and Social History Current every day smoker Physician Affirmation I have reviewed and agree with the above information. Electronic Signature(s) Signed: 01/16/2019 11:01:46 PM By: Worthy Keeler PA-C Signed: 01/17/2019 10:13:10 AM By: Montey Hora Entered By: Worthy Keeler on 01/16/2019 22:39:35 [name] ([id number]) -------------------------------------------------------------------------------- SuperBill Details Patient Name: Heather Dear A. Date of Service: 01/15/2019 Medical Record Number: [id number] Patient Account Number: [id number] Date of Birth/Sex: Jul 27, 1976 (43 y.o. F) Treating RN: Montey Hora Primary Care Provider: Priscille Kluver Other Clinician: Referring Provider: Priscille Kluver Treating Provider/Extender: Melburn Hake, HOYT Weeks in Treatment: 23 Diagnosis Coding ICD-10 Codes Code Description E11.621 Type 2 diabetes mellitus with foot ulcer L97.522 Non-pressure chronic ulcer of other part of left foot with fat layer exposed M21.372 Foot drop, left foot Facility Procedures CPT4 Code: [id number] Description: 99214 - WOUND CARE VISIT-LEV 4 EST PT Modifier: Quantity: 1 Physician Procedures CPT4 Code: [id number] Description: 03212 - WC PHYS LEVEL 4 - EST PT ICD-10 Diagnosis Description E11.621 Type 2 diabetes mellitus with foot ulcer L97.522 Non-pressure chronic ulcer of other part of left foot with fa M21.372 Foot drop, left foot Modifier: t layer exposed Quantity: 1 Electronic Signature(s) Signed: 01/16/2019 11:01:46 PM By: Worthy Keeler PA-C Entered By: Worthy Keeler on 01/16/2019 22:40:33

## 2019-01-22 ENCOUNTER — Encounter: Payer: Medicaid Other | Admitting: Physician Assistant

## 2019-01-22 ENCOUNTER — Other Ambulatory Visit: Payer: Self-pay

## 2019-01-22 DIAGNOSIS — E11621 Type 2 diabetes mellitus with foot ulcer: Secondary | ICD-10-CM | POA: Diagnosis not present

## 2019-01-23 NOTE — Progress Notes (Signed)
[name], RENALDO ([id number]) Visit Report for 01/22/2019 Arrival Information Details Patient Name: [name], [name] Date of Service: 01/22/2019 1:45 PM Medical Record Number: [id number] Patient Account Number: [id number] Date of Birth/Sex: [birth year] (43 y.o. F) Treating RN: [name] Primary Care [name]: [name] Other Clinician: Referring [name]: [name] Treating [name]/Extender: [name], [name]: 24 Visit Information History Since Last Visit Added or deleted any medications: No Patient Arrived: Wheel Chair Any new allergies or adverse reactions: No Arrival Time: 13:55 Had [name] fall or experienced change in No activities of daily living that may affect Accompanied By: self risk of falls: Transfer Assistance: None Signs or symptoms of abuse/neglect since last visito No Patient Identification Verified: Yes Hospitalized since last visit: No Secondary Verification Process Completed: Yes Implantable device outside of the clinic excluding No Patient Requires Transmission-Based No cellular tissue based products placed in the center Precautions: since last visit: Patient Has Alerts: No Has Dressing in Place as Prescribed: Yes Pain Present Now: No Electronic Signature(s) Signed: 01/22/2019 3:04:11 PM By: [name] Entered By: [name] on 01/22/2019 13:55:29 [name], [name] ([id number]) -------------------------------------------------------------------------------- Clinic Level of Care Assessment Details Patient Name: Heather Dear [name]. Date of Service: 01/22/2019 1:45 PM Medical Record Number: [id number] Patient Account Number: [id number] Date of Birth/Sex: 08-19-1976 (43 y.o. F) Treating RN: [name] Primary Care [name]: [name] Other Clinician: Referring [name]: [name] Treating [name]/Extender: [name], [name]: 24 Clinic Level of Care Assessment  Items TOOL 4 Quantity Score []  - Use when only an EandM is performed on FOLLOW-UP visit 0 ASSESSMENTS - Nursing Assessment / Reassessment X - Reassessment of Co-morbidities (includes updates in patient status) 1 10 X- 1 5 Reassessment of Adherence to [name] Plan ASSESSMENTS - Wound and Skin Assessment / Reassessment []  - Simple Wound Assessment / Reassessment - one wound 0 X- 2 5 Complex Wound Assessment / Reassessment - multiple wounds []  - 0 Dermatologic / Skin Assessment (not related to wound area) ASSESSMENTS - Focused Assessment []  - Circumferential Edema Measurements - multi extremities 0 []  - 0 Nutritional Assessment / Counseling / Intervention X- 1 5 Lower Extremity Assessment (monofilament, tuning fork, pulses) []  - 0 Peripheral Arterial Disease Assessment (using hand held doppler) ASSESSMENTS - Ostomy and/or Continence Assessment and Care []  - Incontinence Assessment and Management 0 []  - 0 Ostomy Care Assessment and Management (repouching, etc.) PROCESS - Coordination of Care X - Simple Patient / Family Education for ongoing care 1 15 []  - 0 Complex (extensive) Patient / Family Education for ongoing care X- 1 10 Staff obtains Programmer, systems, Records, Test Results / Process Orders []  - 0 Staff telephones HHA, Nursing Homes / Clarify orders / etc []  - 0 Routine Transfer to another Facility (non-emergent condition) []  - 0 Routine Hospital Admission (non-emergent condition) []  - 0 New Admissions / Biomedical engineer / Ordering NPWT, Apligraf, etc. []  - 0 Emergency Hospital Admission (emergent condition) X- 1 10 Simple Discharge Coordination [name], [name] ([id number]) []  - 0 Complex (extensive) Discharge Coordination PROCESS - Special Needs []  - Pediatric / Minor Patient Management 0 []  - 0 Isolation Patient Management []  - 0 Hearing / Language / Visual special needs []  - 0 Assessment of Community assistance (transportation, D/C planning, etc.) []  -  0 Additional assistance / Altered mentation []  - 0 Support Surface(s) Assessment (bed, cushion, seat, etc.) INTERVENTIONS - Wound Cleansing / Measurement []  - Simple Wound Cleansing - one wound 0 X-  2 5 Complex Wound Cleansing - multiple wounds X- 1 5 Wound Imaging (photographs - any number of wounds) []  - 0 Wound Tracing (instead of photographs) []  - 0 Simple Wound Measurement - one wound X- 2 5 Complex Wound Measurement - multiple wounds INTERVENTIONS - Wound Dressings X - Small Wound Dressing one or multiple wounds 1 10 []  - 0 Medium Wound Dressing one or multiple wounds []  - 0 Large Wound Dressing one or multiple wounds []  - 0 Application of Medications - topical []  - 0 Application of Medications - injection INTERVENTIONS - Miscellaneous []  - External ear exam 0 []  - 0 Specimen Collection (cultures, biopsies, blood, body fluids, etc.) []  - 0 Specimen(s) / Culture(s) sent or taken to Lab for analysis []  - 0 Patient Transfer (multiple staff / Civil Service fast streamer / Similar devices) []  - 0 Simple Staple / Suture removal (25 or less) []  - 0 Complex Staple / Suture removal (26 or more) []  - 0 Hypo / Hyperglycemic Management (close monitor of Blood Glucose) []  - 0 Ankle / Brachial Index (ABI) - do not check if billed separately X- 1 5 Vital Signs [name], [name] ([id number]) Has the patient been seen at the hospital within the last three years: Yes Total Score: 105 Level Of Care: New/Established - Level 3 Electronic Signature(s) Signed: 01/22/2019 4:25:13 PM By: [name] Entered By: [name] on 01/22/2019 14:44:04 [name] ([id number]) -------------------------------------------------------------------------------- Encounter Discharge Information Details Patient Name: Heather Dear [name]. Date of Service: 01/22/2019 1:45 PM Medical Record Number: [id number] Patient Account Number: [id number] Date of Birth/Sex: 05-11-1976 (43 y.o. F) Treating RN: [name] Primary Care [name]: [name] Other Clinician: Referring [name]: [name] Treating Shonteria Abeln/Extender: [name], [name]: 24 Encounter Discharge Information Items Discharge Condition: Stable Ambulatory Status: Wheelchair Discharge Destination: Home Transportation: Private Auto Accompanied By: family Schedule Follow-up Appointment: Yes Clinical Summary of Care: Electronic Signature(s) Signed: 01/22/2019 3:46:03 PM By: [name] Entered By: [name] on 01/22/2019 14:57:26 Hoeger, [name] Kitchen ([id number]) -------------------------------------------------------------------------------- Lower Extremity Assessment Details Patient Name: Heather Dear [name]. Date of Service: 01/22/2019 1:45 PM Medical Record Number: [id number] Patient Account Number: [id number] Date of Birth/Sex: 02-17-1976 (43 y.o. F) Treating RN: Army Melia Primary Care Dandrea Widdowson: [name] Other Clinician: Referring Jomarie Gellis: [name] Treating Najmo Pardue/Extender: STONE III, [name]: 24 Edema Assessment Assessed: [Left: No] [Right: No] Edema: [Left: N] [Right: o] Vascular Assessment Pulses: Dorsalis Pedis Palpable: [Left:Yes] Extremity colors, hair growth, and conditions: Extremity Color: [Left:Normal] Hair Growth on Extremity: [Left:Yes] Temperature of Extremity: [Left:Warm < 3 seconds] Toe Nail Assessment Left: Right: Thick: No Discolored: No Deformed: No Improper Length and Hygiene: No Electronic Signature(s) Signed: 01/22/2019 3:38:48 PM By: Army Melia Entered By: Army Melia on 01/22/2019 14:05:01 Archuleta, Heather ([id number]) -------------------------------------------------------------------------------- Multi Wound Chart Details Patient Name: Heather Dear [name]. Date of Service: 01/22/2019 1:45 PM Medical Record Number: [id number] Patient Account Number: [id number] Date of Birth/Sex: [birth year] (43 y.o. F) Treating RN: [name] Primary Care Wileen Duncanson: [name] Other Clinician: Referring Xochilth Standish: [name] Treating Samra Pesch/Extender: STONE III, [name]: 24 Vital Signs Height(in): 69 Pulse(bpm): 110 Weight(lbs): 240 Blood Pressure(mmHg): 133/96 Body Mass Index(BMI): 35 Temperature(F): 98.8 Respiratory Rate 18 (breaths/min): Photos: [N/[name]:N/[name]] Wound Location: Left Toe Great Left, Medial Malleolus N/[name] Wounding Event: Gradually Appeared Trauma N/[name] Primary Etiology: Diabetic Wound/Ulcer of the Trauma, Other N/[name] Lower Extremity Comorbid History: Cataracts, Glaucoma, Optic N/[name] N/[name] Neuritis, Chronic sinus problems/congestion, Middle ear problems, Type II Diabetes  Date Acquired: 11/16/2018 01/04/2019 N/[name] Weeks of [name]: 8 2 N/[name] Wound Status: Open Open N/[name] Measurements L x W x D 1.8x0.6x0.2 0.1x0.1x0.1 N/[name] (cm) Area (cm) : 0.848 0.008 N/[name] Volume (cm) : 0.17 0.001 N/[name] % Reduction in Area: -115.80% 91.50% N/[name] % Reduction in Volume: -8.30% 88.90% N/[name] Classification: Grade 3 Partial Thickness N/[name] Exudate Amount: Medium N/[name] N/[name] Exudate Type: Serous N/[name] N/[name] Exudate Color: amber N/[name] N/[name] Wound Margin: Epibole N/[name] N/[name] Granulation Amount: Small (1-33%) N/[name] N/[name] Granulation Quality: Pink N/[name] N/[name] Necrotic Amount: Medium (34-66%) N/[name] N/[name] Exposed Structures: Fat Layer (Subcutaneous N/[name] N/[name] Tissue) Exposed: Yes Fascia: No Tendon: No Coats, Tenecia [name]. ([id number]) Muscle: No Joint: No Bone: No Epithelialization: None N/[name] N/[name] Periwound Skin Texture: Callus: Yes No Abnormalities Noted N/[name] Excoriation: No Induration: No Crepitus: No Rash: No Scarring: No Periwound Skin Moisture: Maceration: Yes No Abnormalities Noted N/[name] Dry/Scaly: No Periwound Skin Color: Erythema: Yes No Abnormalities Noted N/[name] Atrophie Blanche: No Cyanosis: No Ecchymosis: No Hemosiderin Staining: No Mottled: No Pallor: No Rubor: No Erythema Location: Circumferential N/[name] N/[name] Temperature: No  Abnormality N/[name] N/[name] Tenderness on Palpation: No No N/[name] Wound Preparation: Ulcer Cleansing: N/[name] N/[name] Rinsed/Irrigated with Saline Topical Anesthetic Applied: Other: lidocaine 4% [name] Notes Electronic Signature(s) Signed: 01/22/2019 4:25:13 PM By: [name] Entered By: [name] on 01/22/2019 14:39:32 [name] ([id number]) -------------------------------------------------------------------------------- [place] Details Patient Name: Heather Dear [name]. Date of Service: 01/22/2019 1:45 PM Medical Record Number: [id number] Patient Account Number: [id number] Date of Birth/Sex: 08-27-1976 (43 y.o. F) Treating RN: [name] Primary Care Kemper Heupel: [name] Other Clinician: Referring Dorethia Jeanmarie: [name] Treating Brison Fiumara/Extender: [name], [name]: 24 Active Inactive Abuse / Safety / Falls / Self Care Management Nursing Diagnoses: History of Falls Goals: Patient will not experience any injury related to falls Date Initiated: 08/07/2018 Target Resolution Date: 10/19/2018 Goal Status: Active Interventions: Assess fall risk on admission and as needed Notes: Orientation to the Wound Care Program Nursing Diagnoses: Knowledge deficit related to the wound healing center program Goals: Patient/caregiver will verbalize understanding of the [place] Program Date Initiated: 08/07/2018 Target Resolution Date: 10/19/2018 Goal Status: Active Interventions: Provide education on orientation to the wound center Notes: Soft Tissue Infection Nursing Diagnoses: Impaired tissue integrity Goals: Patient will remain free of wound infection Date Initiated: 08/07/2018 Target Resolution Date: 10/19/2018 Goal Status: Active Interventions: Assess signs and symptoms of infection every visit [place], [name] ([id number]) Notes: Wound/Skin Impairment Nursing Diagnoses: Impaired tissue integrity Goals: Ulcer/skin  breakdown will heal within 14 weeks Date Initiated: 08/07/2018 Target Resolution Date: 10/19/2018 Goal Status: Active Interventions: Assess patient/caregiver ability to obtain necessary supplies Notes: Electronic Signature(s) Signed: 01/22/2019 4:25:13 PM By: [name] Entered By: [name] on 01/22/2019 14:39:24 Nez, [name] ([id number]) -------------------------------------------------------------------------------- Pain Assessment Details Patient Name: Heather Dear [name]. Date of Service: 01/22/2019 1:45 PM Medical Record Number: [id number] Patient Account Number: [id number] Date of Birth/Sex: 1976-04-18 (43 y.o. F) Treating RN: [name] Primary Care Kember Boch: [name] Other Clinician: Referring Brya Simerly: [name] Treating Falicity Sheets/Extender: STONE III, [name]: 24 Active Problems Location of Pain Severity and Description of Pain Patient Has Paino No Site Locations Pain Management and Medication Current Pain Management: Electronic Signature(s) Signed: 01/22/2019 3:04:11 PM By: Paulla Fore, RRT, [name] Signed: 01/22/2019 4:25:13 PM By: [name] Entered By: [name] on 01/22/2019 13:55:38 [name] ([id number]) -------------------------------------------------------------------------------- Patient/Caregiver Education Details Patient Name: Heather Dear [name]. Date of Service: 01/22/2019 1:45 PM  Medical Record Number: [id number] Patient Account Number: [id number] Date of Birth/Gender: 06-03-1976 (43 y.o. F) Treating RN: [name] Primary Care Physician: [name] Other Clinician: Referring Physician: Priscille [name] Treating Physician/Extender: Sharalyn Ink in [name]: 43 Education Assessment Education Provided To: Patient Education Topics Provided Offloading: Handouts: Other: continue trying to offload wound Electronic Signature(s) Signed: 01/22/2019 4:25:13 PM By:  [name] Entered By: [name] on 01/22/2019 14:44:38 Couser, [name] ([id number]) -------------------------------------------------------------------------------- Wound Assessment Details Patient Name: Heather Dear [name]. Date of Service: 01/22/2019 1:45 PM Medical Record Number: [id number] Patient Account Number: [id number] Date of Birth/Sex: 1976-04-12 (43 y.o. F) Treating RN: Army Melia Primary Care Kanyla Omeara: [name] Other Clinician: Referring Jerriann Schrom: [name] Treating Maxey Ransom/Extender: STONE III, [name]: 24 Wound Status Wound Number: 5 Primary Diabetic Wound/Ulcer of the Lower Extremity Etiology: Wound Location: Left Toe Great Wound Open Wounding Event: Gradually Appeared Status: Date Acquired: 11/16/2018 Comorbid Cataracts, Glaucoma, Optic Neuritis, Chronic Weeks Of [name]: 8 History: sinus problems/congestion, Middle ear problems, Clustered Wound: No Type II Diabetes Photos Photo Uploaded By: Army Melia on 01/22/2019 14:12:13 Wound Measurements Length: (cm) 1.8 Width: (cm) 0.6 Depth: (cm) 0.2 Area: (cm) 0.848 Volume: (cm) 0.17 % Reduction in Area: -115.8% % Reduction in Volume: -8.3% Epithelialization: None Tunneling: No Undermining: No Wound Description Classification: Grade 3 Wound Margin: Epibole Exudate Amount: Medium Exudate Type: Serous Exudate Color: amber Foul Odor After Cleansing: No Slough/Fibrino No Wound Bed Granulation Amount: Small (1-33%) Exposed Structure Granulation Quality: Pink Fascia Exposed: No Necrotic Amount: Medium (34-66%) Fat Layer (Subcutaneous Tissue) Exposed: Yes Necrotic Quality: Adherent Slough Tendon Exposed: No Muscle Exposed: No Joint Exposed: No Bone Exposed: No Periwound Skin Texture Yurick, [name] ([id number]) Texture Color No Abnormalities Noted: No No Abnormalities Noted: No Callus: Yes Atrophie Blanche: No Crepitus: No Cyanosis: No Excoriation:  No Ecchymosis: No Induration: No Erythema: Yes Rash: No Erythema Location: Circumferential Scarring: No Hemosiderin Staining: No Mottled: No Moisture Pallor: No No Abnormalities Noted: No Rubor: No Dry / Scaly: No Maceration: Yes Temperature / Pain Temperature: No Abnormality Wound Preparation Ulcer Cleansing: Rinsed/Irrigated with Saline Topical Anesthetic Applied: Other: lidocaine 4%, [name] Notes Wound #5 (Left Toe Great) Notes Gentamicin, silver alginate, foam, conform, medipore tape. Electronic Signature(s) Signed: 01/22/2019 3:38:48 PM By: Army Melia Entered By: Army Melia on 01/22/2019 14:04:43 [name] ([id number]) -------------------------------------------------------------------------------- Wound Assessment Details Patient Name: Heather Dear [name]. Date of Service: 01/22/2019 1:45 PM Medical Record Number: [id number] Patient Account Number: [id number] Date of Birth/Sex: 02/22/1976 (43 y.o. F) Treating RN: [name] Primary Care [place] Schweppe: [name] Other Clinician: Referring Sena Clouatre: [name] Treating Nila Winker/Extender: STONE III, [name]: 24 Wound Status Wound Number: 6 Primary Etiology: Trauma, Other Wound Location: Left, Medial Malleolus Wound Status: Healed - Epithelialized Wounding Event: Trauma Date Acquired: 01/04/2019 Weeks Of [name]: 2 Clustered Wound: No Photos Photo Uploaded By: Army Melia on 01/22/2019 14:12:14 Wound Measurements Length: (cm) 0 Width: (cm) 0 Depth: (cm) 0 Area: (cm) 0 Volume: (cm) 0 % Reduction in Area: 100% % Reduction in Volume: 100% Wound Description Classification: Partial Thickness Periwound Skin Texture Texture Color No Abnormalities Noted: No No Abnormalities Noted: No Moisture No Abnormalities Noted: No Electronic Signature(s) Signed: 01/22/2019 4:25:13 PM By: [name] Entered By: [name] on 01/22/2019 14:40:10 [name]  ([id number]) -------------------------------------------------------------------------------- Vitals Details Patient Name: Heather Dear [name]. Date of Service: 01/22/2019 1:45 PM Medical Record Number: [id number] Patient Account Number: [id number] Date of Birth/Sex: 06-07-1976 (43 y.o. F) Treating RN:  [name] Primary Care Iyan Flett: [name] Other Clinician: Referring Lamica Mccart: [name] Treating Kayvan Hoefling/Extender: [name], [name]: 24 Vital Signs Time Taken: 01:55 Temperature (F): 98.8 Height (in): 69 Pulse (bpm): 110 Weight (lbs): 240 Respiratory Rate (breaths/min): 18 Body Mass Index (BMI): 35.4 Blood Pressure (mmHg): 133/96 Reference Range: 80 - 120 mg / dl Electronic Signature(s) Signed: 01/22/2019 3:04:11 PM By: [name] Entered By: [name] on 01/22/2019 13:58:11

## 2019-01-23 NOTE — Progress Notes (Signed)
[name] ([id number]) Visit Report for 01/22/2019 Chief Complaint Document Details Patient Name: [name], [name] Date of Service: 01/22/2019 1:45 PM Medical Record Number: [id number] Patient Account Number: [id number] Date of Birth/Sex: [birth year] (43 y.o. F) Treating RN: Montey Hora Primary Care Provider: Priscille Kluver Other Clinician: Referring Provider: Priscille Kluver Treating Provider/Extender: Melburn Hake, Kanav Kazmierczak Weeks in Treatment: 24 Information Obtained from: Patient Chief Complaint Left great toe ulcer Electronic Signature(s) Signed: 01/22/2019 8:26:01 PM By: Worthy Keeler PA-C Entered By: Worthy Keeler on 01/22/2019 20:15:53 [name] ([id number]) -------------------------------------------------------------------------------- HPI Details Patient Name: Heather Dear A. Date of Service: 01/22/2019 1:45 PM Medical Record Number: [id number] Patient Account Number: [id number] Date of Birth/Sex: 1976-10-24 (43 y.o. F) Treating RN: Montey Hora Primary Care Provider: Priscille Kluver Other Clinician: Referring Provider: Priscille Kluver Treating Provider/Extender: Melburn Hake, Aria Pickrell Weeks in Treatment: 24 History of Present Illness HPI Description: 01/18/18-She is here for initial evaluation of the left great toe ulcer. She is a poor historian in regards to timeframe in detail. She states approximately 4 weeks ago she lacerated her toe on something in the house. She followed up with her primary care who placed her on Bactrim and ultimately a second dose of Bactrim prior to coming to wound clinic. She states she has been treating the toe with peroxide, Betadine and a Band-Aid. She did not check her blood sugar this morning but checked it yesterday morning it was 327; she is unaware of a recent A1c and there are no current records. She saw Dr. she would've orthopedics last week for an old injury to the left ankle, she states he did not see her toe, nor did she bring it to his  attention. She smokes approximately 1 pack cigarettes a day. Her social situation is concerning, she arrives this morning with her mother who appears extremely intoxicated/under the influence; her mother was asked to leave the room and be monitored by the patient's grandmother. The patient's aunt then accompanied the patient and the room throughout the rest of the appointment. We had a lengthy discussion regarding the deleterious effects of uncontrolled hyperglycemia and smoking as it relates to wound healing and overall health. She was strongly encouraged to decrease her smoking and get her diabetes under better control. She states she is currently on a diet and has cut down her [facility] consumption. The left toe is erythematous, macerated and slightly edematous with malodor present. The edema in her left foot is below her baseline, there is no erythema streaking. We will treat her with Santyl, doxycycline; we have ordered and xray, culture and provided a Peg assist surgical shoe and cultured the wound. 01/25/18-She is here in follow-up evaluation for a left great toe ulcer and presents with an abscess to her suprapubic area. She states her blood sugars remain elevated, feeling "sick" and if levels are below 250, but she is trying. She has made no attempt to decrease her smoking stating that we "can't take away her food in her cigarettes". She has been compliant with offloading using the PEG assist you. She is using Santyl daily. the culture obtained last week grew staph aureus and Enterococcus faecalis; continues on the doxycycline and Augmentin was added on Monday. The suprapubic area has erythema, no femoral variation, purple discoloration, minimal induration, was accessed with a cotton tip applicator with sanguinopurulent drainage, this was cultured, I suspect the current antibiotic treatment will cover and we will not add anything to her current treatment plan. She was advised  to go to urgent  care or ER with any change in redness, induration or fever. 02/01/18-She is here in follow-up evaluation for left great toe ulcers and a new abdominal abscess from last week. She was able to use packing until earlier this week, where she "forgot it was there". She states she was feeling ill with GI symptoms last week and was not taking her antibiotic. She states her glucose levels have been predominantly less than 200, with occasional levels between 200-250. She thinks this was contributing to her GI symptoms as they have resolved without intervention. There continues to be significant laceration to left toe, otherwise it clinically looks stable/improved. There is now less superficial opening to the lateral aspect of the great toe that was residual blister. We will transition to [facility] to all wounds, she will continue her Augmentin. If there is no change or deterioration next week for reculture. 02/08/18-She is here in follow-up evaluation for left great toe ulcer and abdominal ulcer. There is an improvement in both wounds. She has been wrapping her left toe with coban, not by our direction, which has created an area of discoloration to the medial aspect; she has been advised to NOT use coban secondary to her neuropathy. She states her glucose levels have been high over this last week ranging from 200-350, she continues to smoke. She admits to being less compliant with her offloading shoe. We will continue with same treatment plan and she will follow-up next week. 02/15/18-She is here in follow-up evaluation for left great toe ulcer and abdominal ulcer. The abdominal ulcer is epithelialized. The left great toe ulcer is improved and all injury from last week using the Coban wrap is resolved, the lateral ulcer is healed. She admits to noncompliance with wearing offloading shoe and admits to glucose levels being greater than 300 most of the week. She continues to smoke and expresses no desire to  quit. There is one area medially that probes deeper than it has historically, erythema to the toe and dorsal foot has consistently waxed and waned. There is no overt signs of cellulitis or infection but we will culture the wound for any occult infection given the new area of depth and erythema. We will hold off on sensitivities for initiation of antibiotic therapy. 02/22/18-She is here in follow up evaluation for left great toe ulcer. There is overall significant improvement in both wound appearance, erythema and edema with changes made last week. She was not initiated on antibiotic therapy. Culture obtained last week showed oxacillin sensitive staph aureus, sensitive to clindamycin. Clindamycin has been called into the pharmacy but she has been instructed to hold off on initiation secondary to overall clinical improvement and her history of antibiotic intolerance. She has been instructed to contact the clinic with any noted changes/deterioration and the wound, erythema, [name], [name] ([id number]) edema and/or pain. She will follow-up next week. She continues to smoke and her glucose levels remain elevated >250; she admits to compliance with offloading shoe 03/01/18 on evaluation today patient appears to be doing fairly well in regard to her left first toe ulcer. She has been tolerating the dressing changes with the [facility] Dressing without complication and overall this has definitely showed signs of improvement according to records as well is what the patient tells me today. I'm very pleased in that regard. She is having no pain today 03/08/18 She is here for follow up evaluation of a left great toe ulcer. She remains non-compliant with glucose control  and smoking cessation; glucose levels consistently >200. She states that she got new shoe inserts/peg assist. She admits to compliance with offloading. Since my last evaluation there is significant improvement. We will switch to prisma at this  time and she will follow up next week. She is noted to be tachycardic at this appointment, heart rate 120s; she has a history of heart rate 70-130 according to our records. She admits to extreme agitation r/t personal issues; she was advised to monitor her heartrate and contact her physician if it does not return to a more normal range (<100). She takes cardizem twice daily. 03/15/18-She is here in follow-up evaluation for left great toe ulcer. She remains noncompliant with glucose control and smoking cessation. She admits to compliance with wearing offloading shoe. The ulcer is improved/stable and we will continue with the same treatment plan and she will follow-up next week 03/22/18-She is here for evaluation for left great toe ulcer. There continues to be significant improvement despite recurrent hyperglycemia (over 500 yesterday) and she continues to smoke. She has been compliant with offloading and we will continue with same treatment plan and she will follow-up next week. 03/29/18-She is here for evaluation for left great toe ulcer. Despite continuing to smoke and uncontrolled diabetes she continues to improve. She is compliant with offloading shoe. We will continue with the same treatment plan and she will follow-up next week 04/05/18- She is here in follow up evaluation for a left great toe ulcer; she presents with small pustule to left fifth toe (resembles ant bite). She admits to compliance with wearing offloading shoe; continues to smoke or have uncontrolled blood glucose control. There is more callus than usual with evidence of bleeding; she denies known trauma. 04/12/18-She is here for evaluation of left great toe ulcer. Despite noncompliance with glycemic control and smoking she continues to make improvement. She continues to wear offloading shoe. The pustule, that was identified last week, to the left fifth toe is resolved. She will follow-up in 2 weeks 05/03/18-she is seen in follow-up  evaluation for a left great toe ulcer. She is compliant with offloading, otherwise noncompliant with glycemic control and smoking. She has plateaued and there is minimal improvement noted. We will transition to [facility], replaced the insert to her surgical shoe and she will follow-up in one week 05/10/18- She is here in follow up evaluation for a left great toe ulcer. It appears stable despite measurement change. We will continue with same treatment plan and follow up next week. 05/24/18-She is seen in follow-up evaluation for a left great toe ulcer. She remains compliant with offloading, has made significant improvement in her diet, decreasing the amount of sugar/soda. She said her recent A1c was 10.9 which is lower than. She did see a diabetic nutritionist/educator yesterday. She continues to smoke. We will continue with the same treatment plan and she'll follow-up next week. 05/31/18- She is seen in follow-up evaluation for left great toe ulcer. She continues to remain compliant with offloading, continues to make improvement in her diet, increasing her water and decreasing the amount of sugar/soda. She does continue to smoke with no desire to quit. We will apply Prisma to the depth and Hydrofera Blue over. We have not received insurance authorization for oasis. She will follow up next week. 06/07/18-She is seen in follow-up evaluation for left great toe ulcer. It has stalled according to today's measurements although base appears stable. She says she saw a diabetic educator yesterday; her average blood sugars are  less than 300 which is an improvement for her. She continues to smoke and states "that's my next step" She continues with water over soda. We will order for xray, culture and reinstate ace wrap compression prior to placing apligraf for next week. She is voicing no complaints or concerns. Her dressing will change to iodoflex over the next week in preparation for apligraf. 06/14/18-She is  seen in follow-up evaluation for left great toe ulcer. Plain film x-ray performed last week was negative for osteomyelitis. Wound culture obtained last week grew strep B and OSSA; she is initiated on keflex and cefdinir today; there is erythema to the toe which could be from ace wrap compression, she has a history of wrapping too tight and has has been encouraged to maintain ace wraps that we place today. We will hold off on application of apligraf today, will apply next week after antibiotic therapy has been initiated. She admits today that she has resumed taking a shower with her foot/toe submerged in water, she has been reminded to keep foot/toe out of the bath water. She will be seen in follow up next week 06/21/18-she is seen in follow-up evaluation for left great toe ulcer. She is tolerating antibiotic therapy with no GI disturbance. The wound is stable. Apligraf was applied today. She has been decreasing her smoking, only had 4 cigarettes yesterday and 1 today. She continues being more compliant in diabetic diet. She will follow-up next week for evaluation of site, if stable will remove at 2 weeks. 06/28/18- She is here in follow up evalution. Apligraf was placed last week, she states the dressing fell off on Tuesday and she was dressing with hydrofera blue. She is healed and will be discharged from the clinic today. She has been instructed to continue with smoking cessation, continue monitoring glucose levels, offloading for an additional 4 weeks and continue with Norcia, Tashera A. ([id number]) hydrofera blue for additional two weeks for any possible microscopic opening. Readmission: 08/07/18 on evaluation today patient presents for reevaluation concerning the ulcer of her right great toe. She was previously discharged on 06/28/18 healed. Nonetheless she states that this began to show signs of drainage she subsequently went to her primary care provider. Subsequently an x-ray was performed on  08/01/18 which was negative. The patient was also placed on antibiotics at that time. Fortunately they should have been effective for the infection. Nonetheless she's been experiencing some improvement but still has a lot of drainage coming from the wound itself. 08/14/18 on evaluation today patient's wound actually does show signs of improvement in regard to the erythema at this point. She has completed the antibiotics. With that being said we did discuss the possibility of placing her in a total contact cast as of today although I think that I may want to give this just a little bit more time to ensure nothing recurrence as far as her infection is concerned. I do not want to put in the cast and risk infection at that time if things are not completely resolved. With that being said she is gonna require some debridement today. 08/21/18 on evaluation today patient actually appears to be doing okay in regard to her toe ulcer. She's been tolerating the dressing changes without complication. With that being said it does appear that she is ready and in fact I think it's appropriate for [place] to go ahead and initiate the total contact cast today. Nonetheless she will require some sharp debridement to prepare the wound for application. Overall  I feel like things have been progressing well but we do need to do something to get this to close more readily. 08/24/18 patient seen today for reevaluation after having had the total contact cast applied on Tuesday. She seems to have done very well the wound appears to be doing great and overall I'm pleased with the progress that she's made. There were no abnormal areas of rubbing from the cast on her lower extremity. 08/30/18 on evaluation today patient actually appears to be completely healed in regard to her plantar toe ulcer. She tells me at this point she's been having a lot of issues with the cast. She almost fell a couple of times the state shall the step of her  dog a couple times as well. This is been a very frustrating process for her other nonetheless she has completely healed the wound which is excellent news. Overall there does not appear to be the evidence of infection at this time which is great news. 09/11/18 evaluation today patient presents for follow-up concerning her great toe ulcer on the left which has unfortunately reopened since I last saw her which was only a couple of weeks ago. Unfortunately she was not able to get in to get the shoe and potentially the AFO that's gonna be necessary due to her left foot drop. She continues with offloading shoe but this is not enough to prevent her from reopening it appears. When we last had her in the total contact cast she did well from a healing standpoint but unfortunately the wound reopened as soon as she came out of the cast within just a couple of weeks. 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 This is also part of the reason why she falls on a regular basis. I just do not believe that is gonna be safe for [place] to reinitiate the total contact cast as last time we had this on she fell 3 times one week which is definitely not normal for her. 09/18/18 upon evaluation today the patient actually appears to be doing about the same in regard to her toe ulcer. She did not contact [employer] as I asked her to even though I had given her the prescription. In fact she actually states that she has no idea where the prescription is. She did apparently call [employer] and they told her that all she needed to do was bring the prescription in order to be able to be seen and work on getting the AFO for her left foot. With all that being said she still does not have an appointment and I'm not sure were things stand that regard.  I will give her a new prescription today in order to contact them to get this set up. 09/25/18 on evaluation today patient actually appears to be doing about the same in regard to her toes ulcer. She does have a small areas which seems to have a lot of callous buildup around the edge of the wound which is going to need sharp debridement today. She still is waiting to be scheduled for evaluation with [employer] for possibility of an AFO. She states there supposed to call her tomorrow to get this set up. Unfortunately it does appear that her foot specifically the toe area is showing signs  of erythema. There does not appear to be any systemic infection which is in these good news. 10/02/18 on evaluation today patient actually appears to be doing about the same in regard to her toe ulcer. This really has not done too well although it's not significantly larger it's also not significantly smaller. She has been tolerating the dressing changes without complication. She actually has her appointment with [facility] and [place] tomorrow to hopefully be measured for obtaining and AFO splint. I think this would be helpful preventing this from reoccurring. We had contemplated starting the cast this week although to be honest I am reluctant to do that as she's been having nausea, vomiting, and seizure activity over the past three days. She has a history of seizures and have been told is nothing that can be done for these. With [place]rose, Quentin A. ([id number]) that being said I do believe that along with the seizures have the nausea vomiting which upon further questioning doesn't seem to be the normal for her and makes me concerned for the possibility of infection or something else going on. I discussed this with the patient and her mother during the office visit today. I do not feel the wound is effective but maybe something else. The responses this was "this just happens to her at times and we don't know why". They did  not seem to be interested in going to the hospital to have this checked out further. 10/09/18 on evaluation today patient presents for follow-up concerning her ongoing toe ulcer. She has been tolerating the dressing changes without complication. Fortunately there does not appear to be any evidence of infection which is great news however I do think that the patient would benefit from going ahead for with the total contact cast. She's actually in a wheelchair today she tells me that she will use her walker if we initiate the cast. I was very specific about the fact that if we were gonna do the cast I wanted to make sure that she was using the walker in order to prevent any falls. She tells me she does not have stairs that she has to traverse on a regular basis at her home. She has not had any seizures since last week again that something that happens to her often she tells me she did talk to [employer] and they said that it may take up to three weeks to get the brace approved for her. Hopefully that will not take that long but nonetheless in the meantime I do think the cast could be of benefit. 10/12/18 on evaluation today patient appears to be doing rather well in regard to her toe ulcer. It's just been a few days and already this is significantly improved both as far as overall appearance and size. Fortunately there's no sign of infection. She is here for her first obligatory cast change. 10/19/18 Seen today for follow up and management of left great toe ulcer. Wound continues to show improvement. Noted small open area with seroussang drainage with palpation. Denies any increased pain or recent fevers during visit. She will continue calcium alginate with offloading shoe. Denies any questions or concerns during visit. 10/26/18 on evaluation today patient appears to be doing about the same as when I last saw her in regard to her wound bed. Fortunately there does not appear to be any signs of infection.  Unfortunately she continues to have a breakdown in regard to the toe region any time that she is not in the cast.  It takes almost no time at all for this to happen. Nonetheless she still has not heard anything from the brace being made by [employer] as to when exactly this will be available to her. Fortunately there is no signs of infection at this time. 10/30/18 on evaluation today patient presents for application of the total contact cast as we just received him this morning. Fortunately we are gonna be able to apply this to her today which is great news. She continues to have no significant pain which is good news. Overall I do feel like things have been improving while she was the cast is when she doesn't have a cast that things get worse. She still has not really heard anything from [employer] regarding her brace. 11/02/18 upon evaluation today patient's wound already appears to be doing significantly better which is good news. Fortunately there does not appear to be any signs of infection also good news. Overall I do think the total contact cast as before is helping to heal this area unfortunately it's just not gonna likely keep the area closed and healed without her getting her brace at least. Again the foot drop is a significant issue for her. 11/09/18 on evaluation today patient appears to be doing excellent in regard to her toe ulcer which in fact is completely healed. Fortunately we finally got the situation squared away with the paperwork which was needed to proceed with getting her brace approved by Medicaid. I have filled that out unfortunately that information has been sent to the orthopedic office that I worked at 2 1/2 years ago and not tired Current wound care measures. Fortunately she seems to be doing very well at this time. 11/23/18 on evaluation today patient appears to be doing More Poorly Compared to Last Time I Saw Her. At [facility] She Had Completely Healed. Currently she is  continuing to have issues with reopening. She states that she just found out that the brace was approved through Medicaid now she just has to go get measured in order to have this fitted for her and then made. Subsequently she does not have an appointment for this yet that is going to complicate things we obviously cannot put her back in the cast if we do not have everything measured because they're not gonna be able to measure her foot while she is in the cast. 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 Obviously this is unfortunate and does have me somewhat worried as well. 11/30/18 on evaluation today patient's toe ulcer actually appears to be doing fairly well. The good news is she will be getting her brace in the shoes next week on Wednesday. Hopefully we will be able to get this to heal without having to go back in the cast however she may need the cast in order to get the wound completely heal and then go from there. Fortunately there's no signs of infection at this time. 12/07/18 on evaluation today patient fortunately did receive her brace and she states she could tell this definitely makes her walk better. With that being said she's been having issues with her toe where she noticed yesterday there was a lot of tissue Strayer, Sheranda A. ([id number]) that was loosing off this appears to be much larger than what it was previous. She also states that her leg has been read putting much across the top of  her foot just about the ankle although this seems to be receiving somewhat. The total area is still red and appears to be someone infected as best I can tell. She is previously taken Bactrim and that may be a good option for her today as well. We are gonna see what I wound culture shows as well and I think that this is definitely appropriate. With that being said outside of the culture I still need to  initiate something in the interim and that's what I'm gonna go ahead and select Bactrim is a good option for her. 12/14/18 on evaluation today patient appears to be doing better in regard to her left great toe ulcer as compared to last week's evaluation. There's still some erythema although this is significantly improved which is excellent news. Overall I do believe that she is making good progress is still gonna take some time before she is where I would like her to be from the standpoint of being able to place her back into the total contact cast. Hopefully we will be where we need to be by next week. 12/21/18 on evaluation today patient actually appears to be doing poorly in regard to her toe ulcer. She's been tolerating the dressing changes without complication. Fortunately there's no signs of systemic infection although she does have a lot of drainage from the toe ulcer and this does seem to be causing some issues at this point. She does have erythema on the distal portion of her toe that appears to be likely cellulitis. 12/28/18 on evaluation today patient actually appears to be doing a little better in my pinion in regard to her toe ulcer. With that being said she still does have some evidence of infection at this time and for her culture she had both E. coli as well as enterococcus as organisms noted on evaluation. For that reason I think that though the Keflex likely has treated the E. coli rather well this has really done nothing for the enterococcus. We are going to have to initiate treatment for this specifically. 01/04/19 on evaluation today patient's toe actually appears to be doing better from the standpoint of infection. She currently would like to see about putting the cash back on I think that this is appropriate as long as she takes care of it and keeps it from getting wet. She is gonna have some drainage we can definitely pass this up with Drawtex and alginate to try to prevent as  much drainage as possible from causing the problems. With that being said I do want to at least try her with the cast between now and Tuesday. If there any issues we can't continue to use it then I will discontinue the use of the cast at that point. 01/08/19 on evaluation today patient actually appears to be doing very well as far as her foot ulcer specifically the great toe on the left is concerned. She did have an area of rubbing on the medial aspect of her left ankle which again is from the cast. Fortunately there's no signs of infection at this point in this appears to be a very slight skin breakdown. The patient tells me she felt it rubbing but didn't think it was that bad. Fortunately there is no signs of active infection at this time which is good news. No fevers, chills, nausea, or vomiting noted at this time. 01/15/19 on evaluation today patient actually appears to be doing well in regard to her toe ulcer. Again as  previous she seems to do well and she has the cast on which indicates to me that during the time she doesn't have a cast on she's putting way too much pressure on this region. Obviously I think that's gonna be an issue as with the current national emergency concerning the Covid-19 Virus it has been recommended that we discontinue the use of total contact casting by the chief medical officer of our company, Dr. Simona Huh. The reasoning is that if a patient becomes sick and cannot come into have the cast removed they could not just leave this on for an additional two weeks. Obviously the hospitals also do not want to receive patient's who are sick into the emergency department to potentially contaminate the region and spread the Covid-19 Virus among other sick individuals within the hospital system. Therefore at this point we are suspending the use of total contact cast until the current emergency subsides. This was all discussed with the patient today as well. 01/22/19 on evaluation  today patient's wound on her left great toe appears to be doing slightly worse than previously noted last week. She tells me that she has been on this quite a bit in fact she tells me she's been awake for 38 straight hours. This is due to the fact that she's having to care for grandparents because nobody else will. She has been taking care of them for five the last seven days since I've seen her they both have dementia his is from a stroke and her grandmother's was progressive. Nonetheless she states even her mom who knows her condition and situation has only help two of those days to take care of them she's been taking care of the rest. Fortunately there does not appear to be any signs of active infection in regard to her toe at this point although obviously it doesn't look as good as it did previous. I think this is directly related to her not taking off the pressure and friction by way of taking things easy. Though I completely understand what's going on. Electronic Signature(s) Signed: 01/22/2019 8:26:01 PM By: Worthy Keeler PA-C Entered By: Worthy Keeler on 01/22/2019 20:16:10 [name] ([id number] [name], [name] ([id number]) -------------------------------------------------------------------------------- Physical Exam Details Patient Name: Heather Dear A. Date of Service: 01/22/2019 1:45 PM Medical Record Number: [id number] Patient Account Number: [id number] Date of Birth/Sex: 1976/03/17 (43 y.o. F) Treating RN: Montey Hora Primary Care Provider: Priscille Kluver Other Clinician: Referring Provider: Priscille Kluver Treating Provider/Extender: STONE III, Anavictoria Wilk Weeks in Treatment: 75 Constitutional Well-nourished and well-hydrated in no acute distress. Respiratory normal breathing without difficulty. clear to auscultation bilaterally. Cardiovascular regular rate and rhythm with normal S1, S2. Psychiatric this patient is able to make decisions and demonstrates good insight  into disease process. Alert and Oriented x 3. pleasant and cooperative. Notes Patient's wound bed did not require any active sharp debridement at this time things seem to be doing fairly well the biggest thing is I think she needs to protect the toe in order to prevent it from getting worse. I think she's definitely at risk for imputation and she's not able to get this to close. Electronic Signature(s) Signed: 01/22/2019 8:26:01 PM By: Worthy Keeler PA-C Entered By: Worthy Keeler on 01/22/2019 20:16:43 [name] ([id number]) -------------------------------------------------------------------------------- Physician Orders Details Patient Name: Heather Dear A. Date of Service: 01/22/2019 1:45 PM Medical Record Number: [id number] Patient Account Number: [id number] Date of Birth/Sex: Oct 14, 1976 (43 y.o. F) Treating RN: Montey Hora Primary  Care Provider: Priscille Kluver Other Clinician: Referring Provider: Priscille Kluver Treating Provider/Extender: STONE III, Rayshawn Visconti Weeks in Treatment: 29 Verbal / Phone Orders: No Diagnosis Coding Wound Cleansing Wound #5 Left Toe Great o Clean wound with Normal Saline. Anesthetic (add to Medication List) Wound #5 Left Toe Great o Topical Lidocaine 4% cream applied to wound bed prior to debridement (In Clinic Only). Primary Wound Dressing Wound #5 Left Toe Great o Silver Alginate Secondary Dressing Wound #5 Left Toe Great o Conform/Kerlix o Foam Dressing Change Frequency Wound #5 Left Toe Great o Three times weekly Follow-up Appointments Wound #5 Left Toe Great o Return Appointment in 1 week. Off-Loading Wound #5 Left Toe Great o Open toe surgical shoe to: - Wear your shoe with your brace Electronic Signature(s) Signed: 01/22/2019 4:25:13 PM By: Montey Hora Signed: 01/22/2019 8:26:01 PM By: Worthy Keeler PA-C Entered By: Montey Hora on 01/22/2019 14:42:57 [name], [name]  ([id number]) -------------------------------------------------------------------------------- Problem List Details Patient Name: Heather Dear A. Date of Service: 01/22/2019 1:45 PM Medical Record Number: [id number] Patient Account Number: [id number] Date of Birth/Sex: May 16, 1976 (43 y.o. 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 Date of Service: 01/22/2019 1:45 PM Medical Record Number: [id number] Patient Account Number: [id number] Date of Birth/Sex: 05/29/1976 (43 y.o. F) Treating RN: Montey Hora Primary Care Provider: Priscille Kluver Other Clinician: Referring Provider: Priscille Kluver Treating Provider/Extender: Melburn Hake, Natashia Roseman Weeks in Treatment: 24 Subjective Chief Complaint Information obtained from Patient Left great toe ulcer History of Present Illness (HPI) 01/18/18-She is here for initial evaluation of the left great toe ulcer. She is a poor historian in regards to timeframe in detail. She states approximately 4 weeks ago she lacerated her toe on something in the house. She followed up with her  primary care who placed her on Bactrim and ultimately a second dose of Bactrim prior to coming to wound clinic. She states she has been treating the toe with peroxide, Betadine and a Band-Aid. She did not check her blood sugar this morning but checked it yesterday morning it was 327; she is unaware of a recent A1c and there are no current records. She saw Dr. she would've orthopedics last week for an old injury to the left ankle, she states he did not see her toe, nor did she bring it to his attention. She smokes approximately 1 pack cigarettes a day. Her social situation is concerning, she arrives this morning with her mother who appears extremely intoxicated/under the influence; her mother was asked to leave the room and be monitored by the patient's grandmother. The patient's aunt then accompanied the patient and the room throughout the rest of the appointment. We had a lengthy discussion regarding the deleterious effects of uncontrolled hyperglycemia and smoking as it relates to wound healing and overall health. She was strongly encouraged to decrease her smoking  and get her diabetes under better control. She states she is currently on a diet and has cut down her [facility] consumption. The left toe is erythematous, macerated and slightly edematous with malodor present. The edema in her left foot is below her baseline, there is no erythema streaking. We will treat her with Santyl, doxycycline; we have ordered and xray, culture and provided a Peg assist surgical shoe and cultured the wound. 01/25/18-She is here in follow-up evaluation for a left great toe ulcer and presents with an abscess to her suprapubic area. She states her blood sugars remain elevated, feeling "sick" and if levels are below 250, but she is trying. She has made no attempt to decrease her smoking stating that we "can't take away her food in her cigarettes". She has been compliant with offloading using the PEG assist you. She  is using Santyl daily. the culture obtained last week grew staph aureus and Enterococcus faecalis; continues on the doxycycline and Augmentin was added on Monday. The suprapubic area has erythema, no femoral variation, purple discoloration, minimal induration, was accessed with a cotton tip applicator with sanguinopurulent drainage, this was cultured, I suspect the current antibiotic treatment will cover and we will not add anything to her current treatment plan. She was advised to go to urgent care or ER with any change in redness, induration or fever. 02/01/18-She is here in follow-up evaluation for left great toe ulcers and a new abdominal abscess from last week. She was able to use packing until earlier this week, where she "forgot it was there". She states she was feeling ill with GI symptoms last week and was not taking her antibiotic. She states her glucose levels have been predominantly less than 200, with occasional levels between 200-250. She thinks this was contributing to her GI symptoms as they have resolved without intervention. There continues to be significant laceration to left toe, otherwise it clinically looks stable/improved. There is now less superficial opening to the lateral aspect of the great toe that was residual blister. We will transition to [facility] to all wounds, she will continue her Augmentin. If there is no change or deterioration next week for reculture. 02/08/18-She is here in follow-up evaluation for left great toe ulcer and abdominal ulcer. There is an improvement in both wounds. She has been wrapping her left toe with coban, not by our direction, which has created an area of discoloration to the medial aspect; she has been advised to NOT use coban secondary to her neuropathy. She states her glucose levels have been high over this last week ranging from 200-350, she continues to smoke. She admits to being less compliant with her offloading shoe. We will  continue with same treatment plan and she will follow-up next week. 02/15/18-She is here in follow-up evaluation for left great toe ulcer and abdominal ulcer. The abdominal ulcer is epithelialized. The left great toe ulcer is improved and all injury from last week using the Coban wrap is resolved, the lateral ulcer is healed. She admits to noncompliance with wearing offloading shoe and admits to glucose levels being greater than 300 most of the week. She continues to smoke and expresses no desire to quit. There is one area medially that probes deeper than it has historically, erythema to the toe and dorsal foot has consistently waxed and waned. There is no overt signs of cellulitis or infection but we will culture the wound for any occult infection given the new area of depth and erythema. We  will hold off on [place]umen[place], [place] A. ([id number]) sensitivities for initiation of antibiotic therapy. 02/22/18-She is here in follow up evaluation for left great toe ulcer. There is overall significant improvement in both wound appearance, erythema and edema with changes made last week. She was not initiated on antibiotic therapy. Culture obtained last week showed oxacillin sensitive staph aureus, sensitive to clindamycin. Clindamycin has been called into the pharmacy but she has been instructed to hold off on initiation secondary to overall clinical improvement and her history of antibiotic intolerance. She has been instructed to contact the clinic with any noted changes/deterioration and the wound, erythema, edema and/or pain. She will follow-up next week. She continues to smoke and her glucose levels remain elevated >250; she admits to compliance with offloading shoe 03/01/18 on evaluation today patient appears to be doing fairly well in regard to her left first toe ulcer. She has been tolerating the dressing changes with the [facility] Dressing without complication and overall this has definitely showed signs  of improvement according to records as well is what the patient tells me today. I'm very pleased in that regard. She is having no pain today 03/08/18 She is here for follow up evaluation of a left great toe ulcer. She remains non-compliant with glucose control and smoking cessation; glucose levels consistently >200. She states that she got new shoe inserts/peg assist. She admits to compliance with offloading. Since my last evaluation there is significant improvement. We will switch to prisma at this time and she will follow up next week. She is noted to be tachycardic at this appointment, heart rate 120s; she has a history of heart rate 70-130 according to our records. She admits to extreme agitation r/t personal issues; she was advised to monitor her heartrate and contact her physician if it does not return to a more normal range (<100). She takes cardizem twice daily. 03/15/18-She is here in follow-up evaluation for left great toe ulcer. She remains noncompliant with glucose control and smoking cessation. She admits to compliance with wearing offloading shoe. The ulcer is improved/stable and we will continue with the same treatment plan and she will follow-up next week 03/22/18-She is here for evaluation for left great toe ulcer. There continues to be significant improvement despite recurrent hyperglycemia (over 500 yesterday) and she continues to smoke. She has been compliant with offloading and we will continue with same treatment plan and she will follow-up next week. 03/29/18-She is here for evaluation for left great toe ulcer. Despite continuing to smoke and uncontrolled diabetes she continues to improve. She is compliant with offloading shoe. We will continue with the same treatment plan and she will follow-up next week 04/05/18- She is here in follow up evaluation for a left great toe ulcer; she presents with small pustule to left fifth toe (resembles ant bite). She admits to compliance with  wearing offloading shoe; continues to smoke or have uncontrolled blood glucose control. There is more callus than usual with evidence of bleeding; she denies known trauma. 04/12/18-She is here for evaluation of left great toe ulcer. Despite noncompliance with glycemic control and smoking she continues to make improvement. She continues to wear offloading shoe. The pustule, that was identified last week, to the left fifth toe is resolved. She will follow-up in 2 weeks 05/03/18-she is seen in follow-up evaluation for a left great toe ulcer. She is compliant with offloading, otherwise noncompliant with glycemic control and smoking. She has plateaued and there is minimal improvement noted. We will transition  to [facility], replaced the insert to her surgical shoe and she will follow-up in one week 05/10/18- She is here in follow up evaluation for a left great toe ulcer. It appears stable despite measurement change. We will continue with same treatment plan and follow up next week. 05/24/18-She is seen in follow-up evaluation for a left great toe ulcer. She remains compliant with offloading, has made significant improvement in her diet, decreasing the amount of sugar/soda. She said her recent A1c was 10.9 which is lower than. She did see a diabetic nutritionist/educator yesterday. She continues to smoke. We will continue with the same treatment plan and she'll follow-up next week. 05/31/18- She is seen in follow-up evaluation for left great toe ulcer. She continues to remain compliant with offloading, continues to make improvement in her diet, increasing her water and decreasing the amount of sugar/soda. She does continue to smoke with no desire to quit. We will apply Prisma to the depth and Hydrofera Blue over. We have not received insurance authorization for oasis. She will follow up next week. 06/07/18-She is seen in follow-up evaluation for left great toe ulcer. It has stalled according to today's  measurements although base appears stable. She says she saw a diabetic educator yesterday; her average blood sugars are less than 300 which is an improvement for her. She continues to smoke and states "that's my next step" She continues with water over soda. We will order for xray, culture and reinstate ace wrap compression prior to placing apligraf for next week. She is voicing no complaints or concerns. Her dressing will change to iodoflex over the next week in preparation for apligraf. 06/14/18-She is seen in follow-up evaluation for left great toe ulcer. Plain film x-ray performed last week was negative for osteomyelitis. Wound culture obtained last week grew strep B and OSSA; she is initiated on keflex and cefdinir today; there is erythema to the toe which could be from ace wrap compression, she has a history of wrapping too tight and has has been encouraged to maintain ace wraps that we place today. We will hold off on application of apligraf today, will apply next week after antibiotic therapy has been initiated. She admits today that she has resumed taking a shower with her foot/toe submerged in water, she has been reminded to keep foot/toe out of the bath water. She will be seen in follow up next week 06/21/18-she is seen in follow-up evaluation for left great toe ulcer. She is tolerating antibiotic therapy with no GI disturbance. The wound is stable. Apligraf was applied today. She has been decreasing her smoking, only had 4 cigarettes yesterday and [name], [name] ([id number]) 1 today. She continues being more compliant in diabetic diet. She will follow-up next week for evaluation of site, if stable will remove at 2 weeks. 06/28/18- She is here in follow up evalution. Apligraf was placed last week, she states the dressing fell off on Tuesday and she was dressing with hydrofera blue. She is healed and will be discharged from the clinic today. She has been instructed to continue with smoking  cessation, continue monitoring glucose levels, offloading for an additional 4 weeks and continue with hydrofera blue for additional two weeks for any possible microscopic opening. Readmission: 08/07/18 on evaluation today patient presents for reevaluation concerning the ulcer of her right great toe. She was previously discharged on 06/28/18 healed. Nonetheless she states that this began to show signs of drainage she subsequently went to her primary care provider. Subsequently an  x-ray was performed on 08/01/18 which was negative. The patient was also placed on antibiotics at that time. Fortunately they should have been effective for the infection. Nonetheless she's been experiencing some improvement but still has a lot of drainage coming from the wound itself. 08/14/18 on evaluation today patient's wound actually does show signs of improvement in regard to the erythema at this point. She has completed the antibiotics. With that being said we did discuss the possibility of placing her in a total contact cast as of today although I think that I may want to give this just a little bit more time to ensure nothing recurrence as far as her infection is concerned. I do not want to put in the cast and risk infection at that time if things are not completely resolved. With that being said she is gonna require some debridement today. 08/21/18 on evaluation today patient actually appears to be doing okay in regard to her toe ulcer. She's been tolerating the dressing changes without complication. With that being said it does appear that she is ready and in fact I think it's appropriate for [place] to go ahead and initiate the total contact cast today. Nonetheless she will require some sharp debridement to prepare the wound for application. Overall I feel like things have been progressing well but we do need to do something to get this to close more readily. 08/24/18 patient seen today for reevaluation after having had  the total contact cast applied on Tuesday. She seems to have done very well the wound appears to be doing great and overall I'm pleased with the progress that she's made. There were no abnormal areas of rubbing from the cast on her lower extremity. 08/30/18 on evaluation today patient actually appears to be completely healed in regard to her plantar toe ulcer. She tells me at this point she's been having a lot of issues with the cast. She almost fell a couple of times the state shall the step of her dog a couple times as well. This is been a very frustrating process for her other nonetheless she has completely healed the wound which is excellent news. Overall there does not appear to be the evidence of infection at this time which is great news. 09/11/18 evaluation today patient presents for follow-up concerning her great toe ulcer on the left which has unfortunately reopened since I last saw her which was only a couple of weeks ago. Unfortunately she was not able to get in to get the shoe and potentially the AFO that's gonna be necessary due to her left foot drop. She continues with offloading shoe but this is not enough to prevent her from reopening it appears. When we last had her in the total contact cast she did well from a healing standpoint but unfortunately the wound reopened as soon as she came out of the cast within just a couple of weeks. 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 This is also part of the reason why she falls on a regular basis. I just do not believe that is gonna be safe for [place] to reinitiate the total contact cast as last time we had this on she fell 3 times one week which is definitely not normal for her. 09/18/18 upon  evaluation today the patient actually appears to be doing  about the same in regard to her toe ulcer. She did not contact [facility] as I asked her to even though I had given her the prescription. In fact she actually states that she has no idea where the prescription is. She did apparently call [facility] and they told her that all she needed to do was bring the prescription in order to be able to be seen and work on getting the AFO for her left foot. With all that being said she still does not have an appointment and I'm not sure were things stand that regard. I will give her a new prescription today in order to contact them to get this set up. 09/25/18 on evaluation today patient actually appears to be doing about the same in regard to her toes ulcer. She does have a small areas which seems to have a lot of callous buildup around the edge of the wound which is going to need sharp debridement today. She still is waiting to be scheduled for evaluation with [facility] for possibility of an AFO. She states there supposed to call her tomorrow to get this set up. Unfortunately it does appear that her foot specifically the toe area is showing signs of erythema. There does not appear to be any systemic infection which is in these good news. [name], [name] ([id number]) 10/02/18 on evaluation today patient actually appears to be doing about the same in regard to her toe ulcer. This really has not done too well although it's not significantly larger it's also not significantly smaller. She has been tolerating the dressing changes without complication. She actually has her appointment with [facility] and [place] tomorrow to hopefully be measured for obtaining and AFO splint. I think this would be helpful preventing this from reoccurring. We had contemplated starting the cast this week although to be honest I am reluctant to do that as she's been having nausea, vomiting, and seizure activity over the past three days. She has a history of seizures and have been told is nothing  that can be done for these. With that being said I do believe that along with the seizures have the nausea vomiting which upon further questioning doesn't seem to be the normal for her and makes me concerned for the possibility of infection or something else going on. I discussed this with the patient and her mother during the office visit today. I do not feel the wound is effective but maybe something else. The responses this was "this just happens to her at times and we don't know why". They did not seem to be interested in going to the hospital to have this checked out further. 10/09/18 on evaluation today patient presents for follow-up concerning her ongoing toe ulcer. She has been tolerating the dressing changes without complication. Fortunately there does not appear to be any evidence of infection which is great news however I do think that the patient would benefit from going ahead for with the total contact cast. She's actually in a wheelchair today she tells me that she will use her walker if we initiate the cast. I was very specific about the fact that if we were gonna do the cast I wanted to make sure that she was using the walker in order to prevent any falls. She tells me she does not have stairs that she has to traverse on a regular basis at her home. She has not had any seizures since  last week again that something that happens to her often she tells me she did talk to [employer] and they said that it may take up to three weeks to get the brace approved for her. Hopefully that will not take that long but nonetheless in the meantime I do think the cast could be of benefit. 10/12/18 on evaluation today patient appears to be doing rather well in regard to her toe ulcer. It's just been a few days and already this is significantly improved both as far as overall appearance and size. Fortunately there's no sign of infection. She is here for her first obligatory cast change. 10/19/18 Seen  today for follow up and management of left great toe ulcer. Wound continues to show improvement. Noted small open area with seroussang drainage with palpation. Denies any increased pain or recent fevers during visit. She will continue calcium alginate with offloading shoe. Denies any questions or concerns during visit. 10/26/18 on evaluation today patient appears to be doing about the same as when I last saw her in regard to her wound bed. Fortunately there does not appear to be any signs of infection. Unfortunately she continues to have a breakdown in regard to the toe region any time that she is not in the cast. It takes almost no time at all for this to happen. Nonetheless she still has not heard anything from the brace being made by [employer] as to when exactly this will be available to her. Fortunately there is no signs of infection at this time. 10/30/18 on evaluation today patient presents for application of the total contact cast as we just received him this morning. Fortunately we are gonna be able to apply this to her today which is great news. She continues to have no significant pain which is good news. Overall I do feel like things have been improving while she was the cast is when she doesn't have a cast that things get worse. She still has not really heard anything from [facility] regarding her brace. 11/02/18 upon evaluation today patient's wound already appears to be doing significantly better which is good news. Fortunately there does not appear to be any signs of infection also good news. Overall I do think the total contact cast as before is helping to heal this area unfortunately it's just not gonna likely keep the area closed and healed without her getting her brace at least. Again the foot drop is a significant issue for her. 11/09/18 on evaluation today patient appears to be doing excellent in regard to her toe ulcer which in fact is completely healed. Fortunately we finally got the  situation squared away with the paperwork which was needed to proceed with getting her brace approved by Medicaid. I have filled that out unfortunately that information has been sent to the orthopedic office that I worked at 2 1/2 years ago and not tired Current wound care measures. Fortunately she seems to be doing very well at this time. 11/23/18 on evaluation today patient appears to be doing More Poorly Compared to Last Time I Saw Her. At [facility] She Had Completely Healed. Currently she is continuing to have issues with reopening. She states that she just found out that the brace was approved through Medicaid now she just has to go get measured in order to have this fitted for her and then made. Subsequently she does not have an appointment for this yet that is going to complicate things we obviously cannot put her back in  the cast if we do not have everything measured because they're not gonna be able to measure her foot while she is in the cast. 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 Obviously this is unfortunate and does have me somewhat worried as well. 11/30/18 on evaluation today patient's toe ulcer actually appears to be doing fairly well. The good news is she will be getting her Wimer, Evalisse A. ([id number]) brace in the shoes next week on Wednesday. Hopefully we will be able to get this to heal without having to go back in the cast however she may need the cast in order to get the wound completely heal and then go from there. Fortunately there's no signs of infection at this time. 12/07/18 on evaluation today patient fortunately did receive her brace and she states she could tell this definitely makes her walk better. With that being said she's been having issues with her toe where she noticed yesterday there was a lot of tissue that was loosing off this appears to be much larger than  what it was previous. She also states that her leg has been read putting much across the top of her foot just about the ankle although this seems to be receiving somewhat. The total area is still red and appears to be someone infected as best I can tell. She is previously taken Bactrim and that may be a good option for her today as well. We are gonna see what I wound culture shows as well and I think that this is definitely appropriate. With that being said outside of the culture I still need to initiate something in the interim and that's what I'm gonna go ahead and select Bactrim is a good option for her. 12/14/18 on evaluation today patient appears to be doing better in regard to her left great toe ulcer as compared to last week's evaluation. There's still some erythema although this is significantly improved which is excellent news. Overall I do believe that she is making good progress is still gonna take some time before she is where I would like her to be from the standpoint of being able to place her back into the total contact cast. Hopefully we will be where we need to be by next week. 12/21/18 on evaluation today patient actually appears to be doing poorly in regard to her toe ulcer. She's been tolerating the dressing changes without complication. Fortunately there's no signs of systemic infection although she does have a lot of drainage from the toe ulcer and this does seem to be causing some issues at this point. She does have erythema on the distal portion of her toe that appears to be likely cellulitis. 12/28/18 on evaluation today patient actually appears to be doing a little better in my pinion in regard to her toe ulcer. With that being said she still does have some evidence of infection at this time and for her culture she had both E. coli as well as enterococcus as organisms noted on evaluation. For that reason I think that though the Keflex likely has treated the E. coli rather well  this has really done nothing for the enterococcus. We are going to have to initiate treatment for this specifically. 01/04/19 on evaluation today patient's toe actually appears to be doing better from the standpoint of infection. She currently would like to see about putting the cash  back on I think that this is appropriate as long as she takes care of it and keeps it from getting wet. She is gonna have some drainage we can definitely pass this up with Drawtex and alginate to try to prevent as much drainage as possible from causing the problems. With that being said I do want to at least try her with the cast between now and Tuesday. If there any issues we can't continue to use it then I will discontinue the use of the cast at that point. 01/08/19 on evaluation today patient actually appears to be doing very well as far as her foot ulcer specifically the great toe on the left is concerned. She did have an area of rubbing on the medial aspect of her left ankle which again is from the cast. Fortunately there's no signs of infection at this point in this appears to be a very slight skin breakdown. The patient tells me she felt it rubbing but didn't think it was that bad. Fortunately there is no signs of active infection at this time which is good news. No fevers, chills, nausea, or vomiting noted at this time. 01/15/19 on evaluation today patient actually appears to be doing well in regard to her toe ulcer. Again as previous she seems to do well and she has the cast on which indicates to me that during the time she doesn't have a cast on she's putting way too much pressure on this region. Obviously I think that's gonna be an issue as with the current national emergency concerning the Covid-19 Virus it has been recommended that we discontinue the use of total contact casting by the chief medical officer of our company, Dr. Simona Huh. The reasoning is that if a patient becomes sick and cannot come into have the  cast removed they could not just leave this on for an additional two weeks. Obviously the hospitals also do not want to receive patient's who are sick into the emergency department to potentially contaminate the region and spread the Covid-19 Virus among other sick individuals within the hospital system. Therefore at this point we are suspending the use of total contact cast until the current emergency subsides. This was all discussed with the patient today as well. 01/22/19 on evaluation today patient's wound on her left great toe appears to be doing slightly worse than previously noted last week. She tells me that she has been on this quite a bit in fact she tells me she's been awake for 38 straight hours. This is due to the fact that she's having to care for grandparents because nobody else will. She has been taking care of them for five the last seven days since I've seen her they both have dementia his is from a stroke and her grandmother's was progressive. Nonetheless she states even her mom who knows her condition and situation has only help two of those days to take care of them she's been taking care of the rest. Fortunately there does not appear to be any signs of active infection in regard to her toe at this point although obviously it doesn't look as good as it did previous. I think this is directly related to her not taking off the pressure and friction by way of taking things easy. Though I completely understand what's going on. [name], [name] ([id number]) Patient History Information obtained from Patient. Social History Current every day smoker. Medical History Eyes Patient has history of Cataracts, Glaucoma, Optic Neuritis Ear/Nose/Mouth/Throat Patient  has history of Chronic sinus problems/congestion, Middle ear problems Endocrine Patient has history of Type II Diabetes Review of Systems (ROS) Constitutional Symptoms (General Health) Denies complaints or symptoms of Fever,  Chills. Respiratory The patient has no complaints or symptoms. Cardiovascular The patient has no complaints or symptoms. Psychiatric The patient has no complaints or symptoms. Objective Constitutional Well-nourished and well-hydrated in no acute distress. Vitals Time Taken: 1:55 AM, Height: 69 in, Weight: 240 lbs, BMI: 35.4, Temperature: 98.8 F, Pulse: 110 bpm, Respiratory Rate: 18 breaths/min, Blood Pressure: 133/96 mmHg. Respiratory normal breathing without difficulty. clear to auscultation bilaterally. Cardiovascular regular rate and rhythm with normal S1, S2. Psychiatric this patient is able to make decisions and demonstrates good insight into disease process. Alert and Oriented x 3. pleasant and cooperative. General Notes: Patient's wound bed did not require any active sharp debridement at this time things seem to be doing fairly well the biggest thing is I think she needs to protect the toe in order to prevent it from getting worse. I think she's definitely at risk for imputation and she's not able to get this to close. Integumentary (Hair, Skin) Wound #5 status is Open. Original cause of wound was Gradually Appeared. The wound is located on the Left Toe Great. Campoli, Brodie A. ([id number]) The wound measures 1.8cm length x 0.6cm width x 0.2cm depth; 0.848cm^2 area and 0.17cm^3 volume. There is Fat Layer (Subcutaneous Tissue) Exposed exposed. There is no tunneling or undermining noted. There is a medium amount of serous drainage noted. The wound margin is epibole. There is small (1-33%) pink granulation within the wound bed. There is a medium (34-66%) amount of necrotic tissue within the wound bed including Adherent Slough. The periwound skin appearance exhibited: Callus, Maceration, Erythema. The periwound skin appearance did not exhibit: Crepitus, Excoriation, Induration, Rash, Scarring, Dry/Scaly, Atrophie Blanche, Cyanosis, Ecchymosis, Hemosiderin Staining, Mottled,  Pallor, Rubor. The surrounding wound skin color is noted with erythema which is circumferential. Periwound temperature was noted as No Abnormality. Wound #6 status is Healed - Epithelialized. Original cause of wound was Trauma. The wound is located on the Left,Medial Malleolus. The wound measures 0cm length x 0cm width x 0cm depth; 0cm^2 area and 0cm^3 volume. 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 Anesthetic (add to Medication List): Wound #5 Left Toe Great: Topical Lidocaine 4% cream applied to wound bed prior to debridement (In Clinic Only). Primary Wound Dressing: Wound #5 Left Toe Great: Silver Alginate Secondary Dressing: Wound #5 Left Toe Great: Conform/Kerlix Foam Dressing Change Frequency: Wound #5 Left Toe Great: Three times weekly Follow-up Appointments: Wound #5 Left Toe Great: Return Appointment in 1 week. Off-Loading: Wound #5 Left Toe Great: Open toe surgical shoe to: - Wear your shoe with your brace [name], [name] ([id number]) My suggestion still is gonna be that she attempt to try to stay off of this is much as possible she tells me that she's not sure that's gonna even be a possibility. 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 For that reason I suggested that she may have need to have a conversation with her family about placement in skilled nursing in order to avoid wearing herself out and further damaging her foot. She states that something they begun discussing and I think that could be helpful for  her as well. Otherwise we're gonna see were things stand at follow-up in one weeks time I still strongly recommend that she try to stay off of this is much as possible to allow it to heal appropriately. Electronic  Signature(s) Signed: 01/22/2019 8:26:01 PM By: Worthy Keeler PA-C Entered By: Worthy Keeler on 01/22/2019 20:16:53 [name] ([id number]) -------------------------------------------------------------------------------- ROS/PFSH Details Patient Name: Heather Dear A. Date of Service: 01/22/2019 1:45 PM Medical Record Number: [id number] Patient Account Number: [id number] Date of Birth/Sex: November 11, 1975 (43 y.o. F) Treating RN: Montey Hora Primary Care Provider: Priscille Kluver Other Clinician: Referring Provider: Priscille Kluver Treating Provider/Extender: STONE III, Jem Castro Weeks in Treatment: 24 Information Obtained From Patient Wound History Do you currently have one or more open woundso Yes How many open wounds do you currently haveo 1 Constitutional Symptoms (General Health) Complaints and Symptoms: Negative for: Fever; Chills Eyes Medical History: Positive for: Cataracts; Glaucoma; Optic Neuritis Ear/Nose/Mouth/Throat Medical History: Positive for: Chronic sinus problems/congestion; Middle ear problems Respiratory Complaints and Symptoms: No Complaints or Symptoms Cardiovascular Complaints and Symptoms: No Complaints or Symptoms Endocrine Medical History: Positive for: Type II Diabetes Time with diabetes: 10 years Treated with: Insulin, Oral agents Blood sugar tested every day: No Blood sugar testing results: Lunch: 350 Psychiatric Complaints and Symptoms: No Complaints or Symptoms HBO Extended History Items Pettie, Rozelle A. ([id number]) Eyes: Eyes: Ear/Nose/Mouth/Throat: Ear/Nose/Mouth/Throat: Cataracts Glaucoma Chronic sinus Middle ear problems problems/congestion Immunizations Pneumococcal Vaccine: Received Pneumococcal Vaccination: No Implantable Devices No devices added Family and Social History Current every day smoker Physician Affirmation I have reviewed and agree with the above information. Electronic Signature(s) Signed: 01/22/2019 8:26:01  PM By: Worthy Keeler PA-C Signed: 01/23/2019 2:56:18 PM By: Montey Hora Entered By: Worthy Keeler on 01/22/2019 20:16:25 [name] ([id number]) -------------------------------------------------------------------------------- SuperBill Details Patient Name: Heather Dear A. Date of Service: 01/22/2019 Medical Record Number: [id number] Patient Account Number: [id number] Date of Birth/Sex: 24-Jun-1976 (43 y.o. F) Treating RN: Montey Hora Primary Care Provider: Priscille Kluver Other Clinician: Referring Provider: Priscille Kluver Treating Provider/Extender: Melburn Hake, Enaya Howze Weeks in Treatment: 24 Diagnosis Coding ICD-10 Codes Code Description E11.621 Type 2 diabetes mellitus with foot ulcer L97.522 Non-pressure chronic ulcer of other part of left foot with fat layer exposed M21.372 Foot drop, left foot Facility Procedures CPT4 Code: 81017510 Description: 99213 - WOUND CARE VISIT-LEV 3 EST PT Modifier: Quantity: 1 Physician Procedures CPT4 Code: [id number] Description: 82423 - WC PHYS LEVEL 4 - EST PT ICD-10 Diagnosis Description E11.621 Type 2 diabetes mellitus with foot ulcer L97.522 Non-pressure chronic ulcer of other part of left foot with fa M21.372 Foot drop, left foot Modifier: t layer exposed Quantity: 1 Electronic Signature(s) Signed: 01/22/2019 8:26:01 PM By: Worthy Keeler PA-C Entered By: Worthy Keeler on 01/22/2019 20:17:04

## 2019-01-29 ENCOUNTER — Encounter: Payer: Medicaid Other | Attending: Physician Assistant | Admitting: Physician Assistant

## 2019-01-29 ENCOUNTER — Other Ambulatory Visit: Payer: Self-pay

## 2019-01-29 DIAGNOSIS — E11622 Type 2 diabetes mellitus with other skin ulcer: Secondary | ICD-10-CM | POA: Insufficient documentation

## 2019-01-29 DIAGNOSIS — E119 Type 2 diabetes mellitus without complications: Secondary | ICD-10-CM | POA: Diagnosis present

## 2019-01-29 DIAGNOSIS — L97529 Non-pressure chronic ulcer of other part of left foot with unspecified severity: Secondary | ICD-10-CM | POA: Diagnosis not present

## 2019-01-29 NOTE — Progress Notes (Signed)
HAMNA, ASA ([id number]) Visit Report for 01/29/2019 Arrival Information Details Patient Name: [name], [name] Date of Service: 01/29/2019 9:00 AM Medical Record Number: [id number] Patient Account Number: [id number] Date of Birth/Sex: Nov 17, 1975 (43 y.o. F) Treating RN: Montey Hora Primary Care Donathan Buller: Priscille Kluver Other Clinician: Referring Kyia Rhude: Priscille Kluver Treating Terryann Verbeek/Extender: Melburn Hake, HOYT Weeks in Treatment: 25 Visit Information History Since Last Visit Added or deleted any medications: No Patient Arrived: Ambulatory Any new allergies or adverse reactions: No Arrival Time: 08:55 Had a fall or experienced change in No Accompanied By: mother activities of daily living that may affect Transfer Assistance: None risk of falls: Patient Identification Verified: Yes Signs or symptoms of abuse/neglect since last visito No Secondary Verification Process Completed: Yes Hospitalized since last visit: No Patient Requires Transmission-Based No Implantable device outside of the clinic excluding No Precautions: cellular tissue based products placed in the center Patient Has Alerts: No since last visit: Has Dressing in Place as Prescribed: Yes Pain Present Now: No Electronic Signature(s) Signed: 01/29/2019 11:58:37 AM By: Lorine Bears RCP, RRT, CHT Entered By: Lorine Bears on 01/29/2019 08:55:45 Mimbs, Rexanne A. ([id number]) -------------------------------------------------------------------------------- Complex / Palliative Patient Assessment Details Patient Name: Heather Dear A. Date of Service: 01/29/2019 9:00 AM Medical Record Number: [id number] Patient Account Number: [id number] Date of Birth/Sex: November 26, 1975 (43 y.o. F) Treating RN: Montey Hora Primary Care Rielly Corlett: Priscille Kluver Other Clinician: Referring Karsen Nakanishi: Priscille Kluver Treating Christopherjohn Schiele/Extender: STONE III, HOYT Weeks in Treatment: 25 Palliative Management  Criteria Complex Wound Management Criteria Patient has remarkable or complex co-morbidities requiring medications or treatments that extend wound healing times. Examples: o Diabetes mellitus with chronic renal failure or end stage renal disease requiring dialysis o Advanced or poorly controlled rheumatoid arthritis o Diabetes mellitus and end stage chronic obstructive pulmonary disease o Active cancer with current chemo- or radiation therapy DMII with neuropathy and foot drop Care Approach Wound Care Plan: Complex Wound Management Electronic Signature(s) Signed: 01/29/2019 2:58:29 PM By: Montey Hora Signed: 01/29/2019 5:10:39 PM By: Worthy Keeler PA-C Entered By: Montey Hora on 01/29/2019 09:15:04 [name] ([id number]) -------------------------------------------------------------------------------- Encounter Discharge Information Details Patient Name: Heather Dear A. Date of Service: 01/29/2019 9:00 AM Medical Record Number: [id number] Patient Account Number: [id number] Date of Birth/Sex: Jan 28, 1976 (43 y.o. F) Treating RN: Harold Barban Primary Care Samuell Knoble: Priscille Kluver Other Clinician: Referring Kaho Selle: Priscille Kluver Treating Sidnie Swalley/Extender: Melburn Hake, HOYT Weeks in Treatment: 25 Encounter Discharge Information Items Post Procedure Vitals Discharge Condition: Stable Temperature (F): 98.4 Ambulatory Status: Wheelchair Pulse (bpm): 92 Discharge Destination: Home Respiratory Rate (breaths/min): 18 Transportation: Private Auto Blood Pressure (mmHg): 120/80 Accompanied By: family Schedule Follow-up Appointment: Yes Clinical Summary of Care: Electronic Signature(s) Signed: 01/29/2019 9:35:47 AM By: Harold Barban Entered By: Harold Barban on 01/29/2019 09:35:47 [name], [name] AMarland Kitchen ([id number]) -------------------------------------------------------------------------------- Lower Extremity Assessment Details Patient Name: Heather Dear A. Date  of Service: 01/29/2019 9:00 AM Medical Record Number: [id number] Patient Account Number: [id number] Date of Birth/Sex: 05/19/1976 (43 y.o. F) Treating RN: Army Melia Primary Care Ric Rosenberg: Priscille Kluver Other Clinician: Referring Janiaya Ryser: Priscille Kluver Treating Doil Kamara/Extender: STONE III, HOYT Weeks in Treatment: 25 Edema Assessment Assessed: [Left: No] [Right: No] Edema: [Left: N] [Right: o] Vascular Assessment Pulses: Dorsalis Pedis Palpable: [Left:Yes] Electronic Signature(s) Signed: 01/29/2019 11:09:11 AM By: Army Melia Entered By: Army Melia on 01/29/2019 09:02:58 [name] ([id number]) -------------------------------------------------------------------------------- Multi Wound Chart Details Patient Name: Heather Dear A. Date of Service: 01/29/2019 9:00 AM Medical Record Number: [id number] Patient Account Number: [id number] Date  of Birth/Sex: 1975/12/11 (43 y.o. 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] Comorbid History: [5:Cataracts, Glaucoma, Optic Neuritis, Chronic sinus problems/congestion, Middle ear problems, Type II Diabetes] [N/A:N/A] Date Acquired: [5:11/16/2018] [N/A:N/A] Weeks of Treatment: [5:9] [N/A:N/A] Wound Status: [5:Open] [N/A:N/A] Measurements L x W x D [5:2.1x0.5x0.2] [N/A:N/A] (cm) Area (cm) : [5:0.825] [N/A:N/A] Volume (cm) : [5:0.165] [N/A:N/A] % Reduction in Area: [5:-109.90%] [N/A:N/A] % Reduction in Volume: [5:-5.10%]  [N/A:N/A] Classification: [5:Grade 3] [N/A:N/A] Exudate Amount: [5:Medium] [N/A:N/A] Exudate Type: [5:Serous] [N/A:N/A] Exudate Color: [5:amber] [N/A:N/A] Wound Margin: [5:Epibole] [N/A:N/A] Granulation Amount: [5:Small (1-33%)] [N/A:N/A] Granulation Quality: [5:Pink] [N/A:N/A] Necrotic Amount: [5:Medium (34-66%)] [N/A:N/A] Exposed Structures: [5:Fat Layer (Subcutaneous Tissue) Exposed: Yes Fascia: No Tendon: No Muscle: No Joint: No Bone: No] [N/A:N/A] Epithelialization: [5:None] [N/A:N/A] Periwound Skin Texture: [5:Callus: Yes Excoriation: No Induration: No] [N/A:N/A] Crepitus: No Rash: No Scarring: No Periwound Skin Moisture: Maceration: Yes N/A N/A Dry/Scaly: No Periwound Skin Color: Erythema: Yes N/A N/A Atrophie Blanche: No Cyanosis: No Ecchymosis: No Hemosiderin Staining: No Mottled: No Pallor: No Rubor: No Erythema Location: Circumferential N/A N/A Temperature: No Abnormality N/A N/A Tenderness on Palpation: No N/A N/A Treatment Notes Electronic Signature(s) Signed: 01/29/2019 2:58:29 PM By: Montey Hora Entered By: Montey Hora on 01/29/2019 09:16:12 [name] ([id number]) -------------------------------------------------------------------------------- Louann Details Patient Name: Heather Dear A. Date of Service: 01/29/2019 9:00 AM Medical Record Number: [id number] Patient Account Number: [id number] Date of Birth/Sex: 1976-03-13 (43 y.o. F) Treating RN: Montey Hora Primary Care Loveta Dellis: Priscille Kluver Other Clinician: Referring Jarrell Armond: Priscille Kluver Treating Paidyn Mcferran/Extender: Melburn Hake, HOYT Weeks in Treatment: 25 Active Inactive Abuse / Safety / Falls / Self Care Management Nursing Diagnoses: History of Falls Goals: Patient will not experience any injury related to falls Date Initiated: 08/07/2018 Target Resolution Date: 10/19/2018 Goal Status: Active Interventions: Assess fall risk on admission and as  needed Notes: Orientation to the Wound Care Program Nursing Diagnoses: Knowledge deficit related to the wound healing center program Goals: Patient/caregiver will verbalize understanding of the [place] Program Date Initiated: 08/07/2018 Target Resolution Date: 10/19/2018 Goal Status: Active Interventions: Provide education on orientation to the wound center Notes: Soft Tissue Infection Nursing Diagnoses: Impaired tissue integrity Goals: Patient will remain free of wound infection Date Initiated: 08/07/2018 Target Resolution Date: 10/19/2018 Goal Status: Active Interventions: Assess signs and symptoms of infection every visit [name], [name] ([id number]) Notes: Wound/Skin Impairment Nursing Diagnoses: Impaired tissue integrity Goals: Ulcer/skin breakdown will heal within 14 weeks Date Initiated: 08/07/2018 Target Resolution Date: 10/19/2018 Goal Status: Active Interventions: Assess patient/caregiver ability to obtain necessary supplies Notes: Electronic Signature(s) Signed: 01/29/2019 2:58:29 PM By: Montey Hora Entered By: Montey Hora on 01/29/2019 09:16:05 [name] ([id number]) -------------------------------------------------------------------------------- Pain Assessment Details Patient Name: Heather Dear A. Date of Service: 01/29/2019 9:00 AM Medical Record Number: [id number] Patient Account Number: [id number] Date of Birth/Sex: 1976/07/04 (43 y.o. F) Treating RN: Montey Hora Primary Care Javeon Macmurray: Priscille Kluver Other Clinician: Referring Jeyren Danowski: Priscille Kluver Treating Ahmet Schank/Extender: STONE III, HOYT Weeks in Treatment: 25 Active Problems Location of Pain Severity and Description of Pain Patient Has Paino No Site Locations Pain Management and Medication Current Pain Management: Electronic  Signature(s) Signed: 01/29/2019 11:58:37 AM By: Paulla Fore, RRT, CHT Signed: 01/29/2019 2:58:29 PM By: Montey Hora Entered By: Lorine Bears on 01/29/2019 08:55:52 [name] ([id number]) -------------------------------------------------------------------------------- Patient/Caregiver Education Details Patient Name: Heather Dear A. Date of Service: 01/29/2019 9:00 AM Medical Record Number: [id number] Patient Account Number: [id number] Date of Birth/Gender: 1975/12/24 (43 y.o. F) Treating RN: Montey Hora Primary Care Physician: Priscille Kluver Other Clinician: Referring Physician: Priscille Kluver Treating Physician/Extender: Sharalyn Ink in Treatment: 25 Education Assessment Education Provided To: Patient Education Topics Provided Wound/Skin Impairment: Handouts: Other: wound care as ordered Methods: Demonstration, Explain/Verbal Responses: State content correctly Electronic Signature(s) Signed: 01/29/2019 2:58:29 PM By: Montey Hora Entered By: Montey Hora on 01/29/2019 09:21:17 [name] ([id number]) -------------------------------------------------------------------------------- Wound Assessment Details Patient Name: Heather Dear A. Date of Service: 01/29/2019 9:00 AM Medical Record Number: [id number] Patient Account Number: [id number] Date of Birth/Sex: [birth year] (43 y.o. F) Treating RN: Army Melia Primary Care Laren Orama: Priscille Kluver Other Clinician: Referring Quinlan Mcfall: Priscille Kluver Treating Markiah Janeway/Extender: STONE III, HOYT Weeks in Treatment: 25 Wound Status Wound Number: 5 Primary Diabetic Wound/Ulcer of the Lower Extremity Etiology: Wound Location: Left Toe Great Wound Open Wounding Event: Gradually Appeared Status: Date Acquired: 11/16/2018 Comorbid Cataracts, Glaucoma, Optic Neuritis, Chronic Weeks Of Treatment: 9 History: sinus problems/congestion, Middle ear problems, Clustered Wound: No Type II Diabetes Photos Photo Uploaded By: Army Melia on 01/29/2019 10:21:09 Wound Measurements Length: (cm) 2.1 Width: (cm)  0.5 Depth: (cm) 0.2 Area: (cm) 0.825 Volume: (cm) 0.165 % Reduction in Area: -109.9% % Reduction in Volume: -5.1% Epithelialization: None Tunneling: No Undermining: No Wound Description Classification: Grade 3 Foul Odo Wound Margin: Epibole Slough/F Exudate Amount: Medium Exudate Type: Serous Exudate Color: amber r After Cleansing: No ibrino No Wound Bed Granulation Amount: Small (1-33%) Exposed Structure Granulation Quality: Pink Fascia Exposed: No Necrotic Amount: Medium (34-66%) Fat Layer (Subcutaneous Tissue) Exposed: Yes Necrotic Quality: Adherent Slough Tendon Exposed: No Muscle Exposed: No Joint Exposed: No Bone Exposed: No Periwound Skin Texture [name], [name] ([id number]) Texture Color No Abnormalities Noted: No No Abnormalities Noted: No Callus: Yes Atrophie Blanche: No Crepitus: No Cyanosis: No Excoriation: No Ecchymosis: No Induration: No Erythema: Yes Rash: No Erythema Location: Circumferential Scarring: No Hemosiderin Staining: No Mottled: No Moisture Pallor: No No Abnormalities Noted: No Rubor: No Dry / Scaly: No Maceration: Yes Temperature / Pain Temperature: No Abnormality Treatment Notes Wound #5 (Left Toe Great) Notes silver alginate, foam, conform, medipore tape. Electronic Signature(s) Signed: 01/29/2019 11:09:11 AM By: Army Melia Entered By: Army Melia on 01/29/2019 09:02:44 [name] ([id number]) -------------------------------------------------------------------------------- Vitals Details Patient Name: Heather Dear A. Date of Service: 01/29/2019 9:00 AM Medical Record Number: [id number] Patient Account Number: [id number] Date of Birth/Sex: 13-Jan-1976 (43 y.o. F) Treating RN: Montey Hora Primary Care Lorane Cousar: Priscille Kluver Other Clinician: Referring Panzy Bubeck: Priscille Kluver Treating Ellery Meroney/Extender: STONE III, HOYT Weeks in Treatment: 25 Vital Signs Time Taken: 08:55 Temperature (F): 98.4 Height (in):  69 Pulse (bpm): 92 Weight (lbs): 240 Respiratory Rate (breaths/min): 18 Body Mass Index (BMI): 35.4 Blood Pressure (mmHg): 120/80 Reference Range: 80 - 120 mg / dl Electronic Signature(s) Signed: 01/29/2019 11:58:37 AM By: Lorine Bears RCP, RRT, CHT Entered By: Lorine Bears on 01/29/2019 08:58:01

## 2019-01-29 NOTE — Progress Notes (Signed)
Heather, [name] ([id number]) Visit Report for 01/29/2019 Chief Complaint Document Details Patient Name: [name], [name] Date of Service: 01/29/2019 9:00 AM Medical Record Number: [id number] Patient Account Number: [id number] Date of Birth/Sex: August 17, 1976 (43 y.o. F) Treating RN: Montey Hora Primary Care Provider: Priscille Kluver Other Clinician: Referring Provider: Priscille Kluver Treating Provider/Extender: Melburn Hake, HOYT Weeks in Treatment: 25 Information Obtained from: Patient Chief Complaint Left great toe ulcer Electronic Signature(s) Signed: 01/29/2019 5:10:39 PM By: Worthy Keeler PA-C Entered By: Worthy Keeler on 01/29/2019 09:10:55 [name] ([id number]) -------------------------------------------------------------------------------- Debridement Details Patient Name: Heather Dear A. Date of Service: 01/29/2019 9:00 AM Medical Record Number: [id number] Patient Account Number: [id number] Date of Birth/Sex: [birth year] (43 y.o. F) Treating RN: Montey Hora Primary Care Provider: Priscille Kluver Other Clinician: Referring Provider: Priscille Kluver Treating Provider/Extender: STONE III, HOYT Weeks in Treatment: 25 Debridement Performed for Wound #5 Left Toe Great Assessment: Performed By: Physician STONE III, HOYT E., PA-C Debridement Type: Debridement Severity of Tissue Pre Fat layer exposed Debridement: Level of Consciousness (Pre- Awake and Alert procedure): Pre-procedure Verification/Time Yes - 09:16 Out Taken: Start Time: 09:16 Pain Control: Lidocaine 4% Topical Solution Total Area Debrided (L x W): 2.1 (cm) x 0.5 (cm) = 1.05 (cm) Tissue and other material Viable, Non-Viable, Callus, Slough, Subcutaneous, Slough debrided: Level: Skin/Subcutaneous Tissue Debridement Description: Excisional Instrument: Curette Bleeding: Minimum Hemostasis Achieved: Pressure End Time: 09:19 Procedural Pain: 0 Post Procedural Pain: 0 Response to Treatment: Procedure was  tolerated well Level of Consciousness Awake and Alert (Post-procedure): Post Debridement Measurements of Total Wound Length: (cm) 2.1 Width: (cm) 0.5 Depth: (cm) 0.3 Volume: (cm) 0.247 Character of Wound/Ulcer Post Debridement: Improved Severity of Tissue Post Debridement: Fat layer exposed Post Procedure Diagnosis Same as Pre-procedure Electronic Signature(s) Signed: 01/29/2019 2:58:29 PM By: Montey Hora Signed: 01/29/2019 5:10:39 PM By: Worthy Keeler PA-C Entered By: Montey Hora on 01/29/2019 09:20:00 [name] ([id number]) -------------------------------------------------------------------------------- HPI Details Patient Name: Heather Dear A. Date of Service: 01/29/2019 9:00 AM Medical Record Number: [id number] Patient Account Number: [id number] Date of Birth/Sex: 1975-12-31 (43 y.o. F) Treating RN: Montey Hora Primary Care Provider: Priscille Kluver Other Clinician: Referring Provider: Priscille Kluver Treating Provider/Extender: Melburn Hake, HOYT Weeks in Treatment: 25 History of Present Illness HPI Description: 01/18/18-She is here for initial evaluation of the left great toe ulcer. She is a poor historian in regards to timeframe in detail. She states approximately 4 weeks ago she lacerated her toe on something in the house. She followed up with her primary care who placed her on Bactrim and ultimately a second dose of Bactrim prior to coming to wound clinic. She states she has been treating the toe with peroxide, Betadine and a Band-Aid. She did not check her blood sugar this morning but checked it yesterday morning it was 327; she is unaware of a recent A1c and there are no current records. She saw Dr. she would've orthopedics last week for an old injury to the left ankle, she states he did not see her toe, nor did she bring it to his attention. She smokes approximately 1 pack cigarettes a day. Her social situation is concerning, she arrives this morning with her  mother who appears extremely intoxicated/under the influence; her mother was asked to leave the room and be monitored by the patient's grandmother. The patient's aunt then accompanied the patient and the room throughout the rest of the appointment. We had a lengthy discussion regarding the deleterious effects of uncontrolled hyperglycemia and smoking  as it relates to wound healing and overall health. She was strongly encouraged to decrease her smoking and get her diabetes under better control. She states she is currently on a diet and has cut down her [facility] consumption. The left toe is erythematous, macerated and slightly edematous with malodor present. The edema in her left foot is below her baseline, there is no erythema streaking. We will treat her with Santyl, doxycycline; we have ordered and xray, culture and provided a Peg assist surgical shoe and cultured the wound. 01/25/18-She is here in follow-up evaluation for a left great toe ulcer and presents with an abscess to her suprapubic area. She states her blood sugars remain elevated, feeling "sick" and if levels are below 250, but she is trying. She has made no attempt to decrease her smoking stating that we "can't take away her food in her cigarettes". She has been compliant with offloading using the PEG assist you. She is using Santyl daily. the culture obtained last week grew staph aureus and Enterococcus faecalis; continues on the doxycycline and Augmentin was added on Monday. The suprapubic area has erythema, no femoral variation, purple discoloration, minimal induration, was accessed with a cotton tip applicator with sanguinopurulent drainage, this was cultured, I suspect the current antibiotic treatment will cover and we will not add anything to her current treatment plan. She was advised to go to urgent care or ER with any change in redness, induration or fever. 02/01/18-She is here in follow-up evaluation for left great toe ulcers  and a new abdominal abscess from last week. She was able to use packing until earlier this week, where she "forgot it was there". She states she was feeling ill with GI symptoms last week and was not taking her antibiotic. She states her glucose levels have been predominantly less than 200, with occasional levels between 200-250. She thinks this was contributing to her GI symptoms as they have resolved without intervention. There continues to be significant laceration to left toe, otherwise it clinically looks stable/improved. There is now less superficial opening to the lateral aspect of the great toe that was residual blister. We will transition to [facility] to all wounds, she will continue her Augmentin. If there is no change or deterioration next week for reculture. 02/08/18-She is here in follow-up evaluation for left great toe ulcer and abdominal ulcer. There is an improvement in both wounds. She has been wrapping her left toe with coban, not by our direction, which has created an area of discoloration to the medial aspect; she has been advised to NOT use coban secondary to her neuropathy. She states her glucose levels have been high over this last week ranging from 200-350, she continues to smoke. She admits to being less compliant with her offloading shoe. We will continue with same treatment plan and she will follow-up next week. 02/15/18-She is here in follow-up evaluation for left great toe ulcer and abdominal ulcer. The abdominal ulcer is epithelialized. The left great toe ulcer is improved and all injury from last week using the Coban wrap is resolved, the lateral ulcer is healed. She admits to noncompliance with wearing offloading shoe and admits to glucose levels being greater than 300 most of the week. She continues to smoke and expresses no desire to quit. There is one area medially that probes deeper than it has historically, erythema to the toe and dorsal foot has consistently  waxed and waned. There is no overt signs of cellulitis or infection but we  will culture the wound for any occult infection given the new area of depth and erythema. We will hold off on sensitivities for initiation of antibiotic therapy. 02/22/18-She is here in follow up evaluation for left great toe ulcer. There is overall significant improvement in both wound appearance, erythema and edema with changes made last week. She was not initiated on antibiotic therapy. Culture obtained last week showed oxacillin sensitive staph aureus, sensitive to clindamycin. Clindamycin has been called into the pharmacy but she has been instructed to hold off on initiation secondary to overall clinical improvement and her history of antibiotic intolerance. She has been instructed to contact the clinic with any noted changes/deterioration and the wound, erythema, Kitchen, Latissa A. ([id number]) edema and/or pain. She will follow-up next week. She continues to smoke and her glucose levels remain elevated >250; she admits to compliance with offloading shoe 03/01/18 on evaluation today patient appears to be doing fairly well in regard to her left first toe ulcer. She has been tolerating the dressing changes with the [facility] Dressing without complication and overall this has definitely showed signs of improvement according to records as well is what the patient tells me today. I'm very pleased in that regard. She is having no pain today 03/08/18 She is here for follow up evaluation of a left great toe ulcer. She remains non-compliant with glucose control and smoking cessation; glucose levels consistently >200. She states that she got new shoe inserts/peg assist. She admits to compliance with offloading. Since my last evaluation there is significant improvement. We will switch to prisma at this time and she will follow up next week. She is noted to be tachycardic at this appointment, heart rate 120s; she has a history of  heart rate 70-130 according to our records. She admits to extreme agitation r/t personal issues; she was advised to monitor her heartrate and contact her physician if it does not return to a more normal range (<100). She takes cardizem twice daily. 03/15/18-She is here in follow-up evaluation for left great toe ulcer. She remains noncompliant with glucose control and smoking cessation. She admits to compliance with wearing offloading shoe. The ulcer is improved/stable and we will continue with the same treatment plan and she will follow-up next week 03/22/18-She is here for evaluation for left great toe ulcer. There continues to be significant improvement despite recurrent hyperglycemia (over 500 yesterday) and she continues to smoke. She has been compliant with offloading and we will continue with same treatment plan and she will follow-up next week. 03/29/18-She is here for evaluation for left great toe ulcer. Despite continuing to smoke and uncontrolled diabetes she continues to improve. She is compliant with offloading shoe. We will continue with the same treatment plan and she will follow-up next week 04/05/18- She is here in follow up evaluation for a left great toe ulcer; she presents with small pustule to left fifth toe (resembles ant bite). She admits to compliance with wearing offloading shoe; continues to smoke or have uncontrolled blood glucose control. There is more callus than usual with evidence of bleeding; she denies known trauma. 04/12/18-She is here for evaluation of left great toe ulcer. Despite noncompliance with glycemic control and smoking she continues to make improvement. She continues to wear offloading shoe. The pustule, that was identified last week, to the left fifth toe is resolved. She will follow-up in 2 weeks 05/03/18-she is seen in follow-up evaluation for a left great toe ulcer. She is compliant with offloading, otherwise noncompliant with  glycemic control and smoking.  She has plateaued and there is minimal improvement noted. We will transition to [facility], replaced the insert to her surgical shoe and she will follow-up in one week 05/10/18- She is here in follow up evaluation for a left great toe ulcer. It appears stable despite measurement change. We will continue with same treatment plan and follow up next week. 05/24/18-She is seen in follow-up evaluation for a left great toe ulcer. She remains compliant with offloading, has made significant improvement in her diet, decreasing the amount of sugar/soda. She said her recent A1c was 10.9 which is lower than. She did see a diabetic nutritionist/educator yesterday. She continues to smoke. We will continue with the same treatment plan and she'll follow-up next week. 05/31/18- She is seen in follow-up evaluation for left great toe ulcer. She continues to remain compliant with offloading, continues to make improvement in her diet, increasing her water and decreasing the amount of sugar/soda. She does continue to smoke with no desire to quit. We will apply Prisma to the depth and Hydrofera Blue over. We have not received insurance authorization for oasis. She will follow up next week. 06/07/18-She is seen in follow-up evaluation for left great toe ulcer. It has stalled according to today's measurements although base appears stable. She says she saw a diabetic educator yesterday; her average blood sugars are less than 300 which is an improvement for her. She continues to smoke and states "that's my next step" She continues with water over soda. We will order for xray, culture and reinstate ace wrap compression prior to placing apligraf for next week. She is voicing no complaints or concerns. Her dressing will change to iodoflex over the next week in preparation for apligraf. 06/14/18-She is seen in follow-up evaluation for left great toe ulcer. Plain film x-ray performed last week was negative for osteomyelitis. Wound  culture obtained last week grew strep B and OSSA; she is initiated on keflex and cefdinir today; there is erythema to the toe which could be from ace wrap compression, she has a history of wrapping too tight and has has been encouraged to maintain ace wraps that we place today. We will hold off on application of apligraf today, will apply next week after antibiotic therapy has been initiated. She admits today that she has resumed taking a shower with her foot/toe submerged in water, she has been reminded to keep foot/toe out of the bath water. She will be seen in follow up next week 06/21/18-she is seen in follow-up evaluation for left great toe ulcer. She is tolerating antibiotic therapy with no GI disturbance. The wound is stable. Apligraf was applied today. She has been decreasing her smoking, only had 4 cigarettes yesterday and 1 today. She continues being more compliant in diabetic diet. She will follow-up next week for evaluation of site, if stable will remove at 2 weeks. 06/28/18- She is here in follow up evalution. Apligraf was placed last week, she states the dressing fell off on Tuesday and she was dressing with hydrofera blue. She is healed and will be discharged from the clinic today. She has been instructed to continue with smoking cessation, continue monitoring glucose levels, offloading for an additional 4 weeks and continue with Mumpower, Barri A. ([id number]) hydrofera blue for additional two weeks for any possible microscopic opening. Readmission: 08/07/18 on evaluation today patient presents for reevaluation concerning the ulcer of her right great toe. She was previously discharged on 06/28/18 healed. Nonetheless she states that this  began to show signs of drainage she subsequently went to her primary care provider. Subsequently an x-ray was performed on 08/01/18 which was negative. The patient was also placed on antibiotics at that time. Fortunately they should have been effective for  the infection. Nonetheless she's been experiencing some improvement but still has a lot of drainage coming from the wound itself. 08/14/18 on evaluation today patient's wound actually does show signs of improvement in regard to the erythema at this point. She has completed the antibiotics. With that being said we did discuss the possibility of placing her in a total contact cast as of today although I think that I may want to give this just a little bit more time to ensure nothing recurrence as far as her infection is concerned. I do not want to put in the cast and risk infection at that time if things are not completely resolved. With that being said she is gonna require some debridement today. 08/21/18 on evaluation today patient actually appears to be doing okay in regard to her toe ulcer. She's been tolerating the dressing changes without complication. With that being said it does appear that she is ready and in fact I think it's appropriate for [place] to go ahead and initiate the total contact cast today. Nonetheless she will require some sharp debridement to prepare the wound for application. Overall I feel like things have been progressing well but we do need to do something to get this to close more readily. 08/24/18 patient seen today for reevaluation after having had the total contact cast applied on Tuesday. She seems to have done very well the wound appears to be doing great and overall I'm pleased with the progress that she's made. There were no abnormal areas of rubbing from the cast on her lower extremity. 08/30/18 on evaluation today patient actually appears to be completely healed in regard to her plantar toe ulcer. She tells me at this point she's been having a lot of issues with the cast. She almost fell a couple of times the state shall the step of her dog a couple times as well. This is been a very frustrating process for her other nonetheless she has completely healed the  wound which is excellent news. Overall there does not appear to be the evidence of infection at this time which is great news. 09/11/18 evaluation today patient presents for follow-up concerning her great toe ulcer on the left which has unfortunately reopened since I last saw her which was only a couple of weeks ago. Unfortunately she was not able to get in to get the shoe and potentially the AFO that's gonna be necessary due to her left foot drop. She continues with offloading shoe but this is not enough to prevent her from reopening it appears. When we last had her in the total contact cast she did well from a healing standpoint but unfortunately the wound reopened as soon as she came out of the cast within just a couple of weeks. 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 This is also part of the reason why she falls on a regular basis. I just do not believe that is gonna be safe for [place] to reinitiate the total contact cast as last time we had  this on she fell 3 times one week which is definitely not normal for her. 09/18/18 upon evaluation today the patient actually appears to be doing about the same in regard to her toe ulcer. She did not contact [employer] as I asked her to even though I had given her the prescription. In fact she actually states that she has no idea where the prescription is. She did apparently call [employer] and they told her that all she needed to do was bring the prescription in order to be able to be seen and work on getting the AFO for her left foot. With all that being said she still does not have an appointment and I'm not sure were things stand that regard. I will give her a new prescription today in order to contact them to get this set up. 09/25/18 on evaluation today patient  actually appears to be doing about the same in regard to her toes ulcer. She does have a small areas which seems to have a lot of callous buildup around the edge of the wound which is going to need sharp debridement today. She still is waiting to be scheduled for evaluation with [employer] for possibility of an AFO. She states there supposed to call her tomorrow to get this set up. Unfortunately it does appear that her foot specifically the toe area is showing signs of erythema. There does not appear to be any systemic infection which is in these good news. 10/02/18 on evaluation today patient actually appears to be doing about the same in regard to her toe ulcer. This really has not done too well although it's not significantly larger it's also not significantly smaller. She has been tolerating the dressing changes without complication. She actually has her appointment with [employer] [facility] [employer] tomorrow to hopefully be measured for obtaining and AFO splint. I think this would be helpful preventing this from reoccurring. We had contemplated starting the cast this week although to be honest I am reluctant to do that as she's been having nausea, vomiting, and seizure activity over the past three days. She has a history of seizures and have been told is nothing that can be done for these. With Holtzclaw, Bryanne A. ([id number]) that being said I do believe that along with the seizures have the nausea vomiting which upon further questioning doesn't seem to be the normal for her and makes me concerned for the possibility of infection or something else going on. I discussed this with the patient and her mother during the office visit today. I do not feel the wound is effective but maybe something else. The responses this was "this just happens to her at times and we don't know why". They did not seem to be interested in going to the hospital to have this checked out further. 10/09/18 on evaluation today patient  presents for follow-up concerning her ongoing toe ulcer. She has been tolerating the dressing changes without complication. Fortunately there does not appear to be any evidence of infection which is great news however I do think that the patient would benefit from going ahead for with the total contact cast. She's actually in a wheelchair today she tells me that she will use her walker if we initiate the cast. I was very specific about the fact that if we were gonna do the cast I wanted to make sure that she was using the walker in order to prevent any falls. She tells me she does not have stairs that she  has to traverse on a regular basis at her home. She has not had any seizures since last week again that something that happens to her often she tells me she did talk to [employer] and they said that it may take up to three weeks to get the brace approved for her. Hopefully that will not take that long but nonetheless in the meantime I do think the cast could be of benefit. 10/12/18 on evaluation today patient appears to be doing rather well in regard to her toe ulcer. It's just been a few days and already this is significantly improved both as far as overall appearance and size. Fortunately there's no sign of infection. She is here for her first obligatory cast change. 10/19/18 Seen today for follow up and management of left great toe ulcer. Wound continues to show improvement. Noted small open area with seroussang drainage with palpation. Denies any increased pain or recent fevers during visit. She will continue calcium alginate with offloading shoe. Denies any questions or concerns during visit. 10/26/18 on evaluation today patient appears to be doing about the same as when I last saw her in regard to her wound bed. Fortunately there does not appear to be any signs of infection. Unfortunately she continues to have a breakdown in regard to the toe region any time that she is not in the cast. It takes  almost no time at all for this to happen. Nonetheless she still has not heard anything from the brace being made by [employer] as to when exactly this will be available to her. Fortunately there is no signs of infection at this time. 10/30/18 on evaluation today patient presents for application of the total contact cast as we just received him this morning. Fortunately we are gonna be able to apply this to her today which is great news. She continues to have no significant pain which is good news. Overall I do feel like things have been improving while she was the cast is when she doesn't have a cast that things get worse. She still has not really heard anything from [facility] regarding her brace. 11/02/18 upon evaluation today patient's wound already appears to be doing significantly better which is good news. Fortunately there does not appear to be any signs of infection also good news. Overall I do think the total contact cast as before is helping to heal this area unfortunately it's just not gonna likely keep the area closed and healed without her getting her brace at least. Again the foot drop is a significant issue for her. 11/09/18 on evaluation today patient appears to be doing excellent in regard to her toe ulcer which in fact is completely healed. Fortunately we finally got the situation squared away with the paperwork which was needed to proceed with getting her brace approved by Medicaid. I have filled that out unfortunately that information has been sent to the orthopedic office that I worked at 2 1/2 years ago and not tired Current wound care measures. Fortunately she seems to be doing very well at this time. 11/23/18 on evaluation today patient appears to be doing More Poorly Compared to Last Time I Saw Her. At [facility] She Had Completely Healed. Currently she is continuing to have issues with reopening. She states that she just found out that the brace was approved through Medicaid now she  just has to go get measured in order to have this fitted for her and then made. Subsequently she does not have an  appointment for this yet that is going to complicate things we obviously cannot put her back in the cast if we do not have everything measured because they're not gonna be able to measure her foot while she is in the cast. 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 Obviously this is unfortunate and does have me somewhat worried as well. 11/30/18 on evaluation today patient's toe ulcer actually appears to be doing fairly well. The good news is she will be getting her brace in the shoes next week on Wednesday. Hopefully we will be able to get this to heal without having to go back in the cast however she may need the cast in order to get the wound completely heal and then go from there. Fortunately there's no signs of infection at this time. 12/07/18 on evaluation today patient fortunately did receive her brace and she states she could tell this definitely makes her walk better. With that being said she's been having issues with her toe where she noticed yesterday there was a lot of tissue Swander, Jasey A. ([id number]) that was loosing off this appears to be much larger than what it was previous. She also states that her leg has been read putting much across the top of her foot just about the ankle although this seems to be receiving somewhat. The total area is still red and appears to be someone infected as best I can tell. She is previously taken Bactrim and that may be a good option for her today as well. We are gonna see what I wound culture shows as well and I think that this is definitely appropriate. With that being said outside of the culture I still need to initiate something in the interim and that's what I'm gonna go ahead and select Bactrim is a good option for her. 12/14/18 on  evaluation today patient appears to be doing better in regard to her left great toe ulcer as compared to last week's evaluation. There's still some erythema although this is significantly improved which is excellent news. Overall I do believe that she is making good progress is still gonna take some time before she is where I would like her to be from the standpoint of being able to place her back into the total contact cast. Hopefully we will be where we need to be by next week. 12/21/18 on evaluation today patient actually appears to be doing poorly in regard to her toe ulcer. She's been tolerating the dressing changes without complication. Fortunately there's no signs of systemic infection although she does have a lot of drainage from the toe ulcer and this does seem to be causing some issues at this point. She does have erythema on the distal portion of her toe that appears to be likely cellulitis. 12/28/18 on evaluation today patient actually appears to be doing a little better in my pinion in regard to her toe ulcer. With that being said she still does have some evidence of infection at this time and for her culture she had both E. coli as well as enterococcus as organisms noted on evaluation. For that reason I think that though the Keflex likely has treated the E. coli rather well this has really done nothing for the enterococcus. We are going to have to initiate treatment for this specifically. 01/04/19 on evaluation today patient's toe actually appears to be doing  better from the standpoint of infection. She currently would like to see about putting the cash back on I think that this is appropriate as long as she takes care of it and keeps it from getting wet. She is gonna have some drainage we can definitely pass this up with Drawtex and alginate to try to prevent as much drainage as possible from causing the problems. With that being said I do want to at least try her with the cast between now  and Tuesday. If there any issues we can't continue to use it then I will discontinue the use of the cast at that point. 01/08/19 on evaluation today patient actually appears to be doing very well as far as her foot ulcer specifically the great toe on the left is concerned. She did have an area of rubbing on the medial aspect of her left ankle which again is from the cast. Fortunately there's no signs of infection at this point in this appears to be a very slight skin breakdown. The patient tells me she felt it rubbing but didn't think it was that bad. Fortunately there is no signs of active infection at this time which is good news. No fevers, chills, nausea, or vomiting noted at this time. 01/15/19 on evaluation today patient actually appears to be doing well in regard to her toe ulcer. Again as previous she seems to do well and she has the cast on which indicates to me that during the time she doesn't have a cast on she's putting way too much pressure on this region. Obviously I think that's gonna be an issue as with the current national emergency concerning the Covid-19 Virus it has been recommended that we discontinue the use of total contact casting by the chief medical officer of our company, Dr. Simona Huh. The reasoning is that if a patient becomes sick and cannot come into have the cast removed they could not just leave this on for an additional two weeks. Obviously the hospitals also do not want to receive patient's who are sick into the emergency department to potentially contaminate the region and spread the Covid-19 Virus among other sick individuals within the hospital system. Therefore at this point we are suspending the use of total contact cast until the current emergency subsides. This was all discussed with the patient today as well. 01/22/19 on evaluation today patient's wound on her left great toe appears to be doing slightly worse than previously noted last week. She tells me that she  has been on this quite a bit in fact she tells me she's been awake for 38 straight hours. This is due to the fact that she's having to care for grandparents because nobody else will. She has been taking care of them for five the last seven days since I've seen her they both have dementia his is from a stroke and her grandmother's was progressive. Nonetheless she states even her mom who knows her condition and situation has only help two of those days to take care of them she's been taking care of the rest. Fortunately there does not appear to be any signs of active infection in regard to her toe at this point although obviously it doesn't look as good as it did previous. I think this is directly related to her not taking off the pressure and friction by way of taking things easy. Though I completely understand what's going on. 01/29/19 on evaluation today patient's tools are actually appears to be showing  some signs of improvement today compared to last week's evaluation as far as not necessarily the overall size of the wound but the fact that she has some new skin growth in between the two ends of the wound opening. Overall I feel like she has done well she states that she had a family member give her what sounds to be a CAM walker boot which has been helpful as well. YEVONNE, YOKUM ([id number]) Electronic Signature(s) Signed: 01/29/2019 5:10:39 PM By: Worthy Keeler PA-C Entered By: Worthy Keeler on 01/29/2019 09:55:05 [name] ([id number]) -------------------------------------------------------------------------------- Physical Exam Details Patient Name: Heather Dear A. Date of Service: 01/29/2019 9:00 AM Medical Record Number: [id number] Patient Account Number: [id number] Date of Birth/Sex: 1976/02/06 (43 y.o. F) Treating RN: Montey Hora Primary Care Provider: Priscille Kluver Other Clinician: Referring Provider: Priscille Kluver Treating Provider/Extender: STONE III, HOYT Weeks in  Treatment: 67 Constitutional Well-nourished and well-hydrated in no acute distress. Respiratory normal breathing without difficulty. Psychiatric this patient is able to make decisions and demonstrates good insight into disease process. Alert and Oriented x 3. pleasant and cooperative. Notes Upon inspection today patient's wound bed actually showed some improvement there was good skin/epithelialization noted between the central portion of the wound. I did perform sharp agreement clear way some the callous from the edge of the wound which he tolerated without any pain or complication. Post debridement wound bed appears to be doing much better which is good news. Electronic Signature(s) Signed: 01/29/2019 5:10:39 PM By: Worthy Keeler PA-C Entered By: Worthy Keeler on 01/29/2019 09:57:45 [name] ([id number]) -------------------------------------------------------------------------------- Physician Orders Details Patient Name: Heather Dear A. Date of Service: 01/29/2019 9:00 AM Medical Record Number: [id number] Patient Account Number: [id number] Date of Birth/Sex: [birth year] (43 y.o. 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 Anesthetic (add to Medication List) Wound #5 Left Toe Great o Topical Lidocaine 4% cream applied to wound bed prior to debridement (In Clinic Only). Primary Wound Dressing Wound #5 Left Toe Great o Silver Alginate Secondary Dressing Wound #5 Left Toe Great o Conform/Kerlix o Foam Dressing  Change Frequency Wound #5 Left Toe Great o Three times weekly Follow-up Appointments Wound #5 Left Toe Great o Return Appointment in 1 week. Off-Loading Wound #5 Left Toe Great o Open toe surgical shoe to: - Wear your shoe with your brace Electronic Signature(s) Signed: 01/29/2019 2:58:29 PM By: Montey Hora Signed: 01/29/2019 5:10:39 PM By: Worthy Keeler PA-C Entered By: Montey Hora on 01/29/2019 09:20:35 [name] ([id number]) [name], Deshannon A. ([id number]) -------------------------------------------------------------------------------- Problem List Details Patient Name: Heather Dear A. Date of Service: 01/29/2019 9:00 AM Medical Record Number: [id number] Patient Account Number: [id number] Date of Birth/Sex: 02-05-1976 (43 y.o. F) Treating RN: Montey Hora Primary Care Provider: Priscille Kluver Other Clinician: Referring Provider: Priscille Kluver Treating Provider/Extender: Melburn Hake, HOYT Weeks in Treatment: 25 Active Problems ICD-10 Evaluated Encounter Code Description Active Date Today Diagnosis E11.621 Type 2 diabetes mellitus with foot ulcer 08/07/2018 No Yes L97.522 Non-pressure chronic ulcer of other part of left foot with fat 08/08/2018 No Yes layer exposed M21.372 Foot drop,  left foot 09/11/2018 No Yes Inactive Problems Resolved Problems ICD-10 Code Description Active Date Resolved Date L03.032 Cellulitis of left toe 08/07/2018 08/07/2018 Electronic Signature(s) Signed: 01/29/2019 5:10:39 PM By: Worthy Keeler PA-C Entered By: Worthy Keeler on 01/29/2019 09:10:49 Gritton, Hannie A. ([id number]) -------------------------------------------------------------------------------- Progress Note Details Patient Name: Heather Dear A. Date of Service: 01/29/2019 9:00 AM Medical Record Number: [id number] Patient Account Number: [id number] Date of Birth/Sex: 03-15-1976 (43 y.o. F) Treating RN: Montey Hora Primary Care Provider: Priscille Kluver Other  Clinician: Referring Provider: Priscille Kluver Treating Provider/Extender: Melburn Hake, HOYT Weeks in Treatment: 25 Subjective Chief Complaint Information obtained from Patient Left great toe ulcer History of Present Illness (HPI) 01/18/18-She is here for initial evaluation of the left great toe ulcer. She is a poor historian in regards to timeframe in detail. She states approximately 4 weeks ago she lacerated her toe on something in the house. She followed up with her primary care who placed her on Bactrim and ultimately a second dose of Bactrim prior to coming to wound clinic. She states she has been treating the toe with peroxide, Betadine and a Band-Aid. She did not check her blood sugar this morning but checked it yesterday morning it was 327; she is unaware of a recent A1c and there are no current records. She saw Dr. she would've orthopedics last week for an old injury to the left ankle, she states he did not see her toe, nor did she bring it to his attention. She smokes approximately 1 pack cigarettes a day. Her social situation is concerning, she arrives this morning with her mother who appears extremely intoxicated/under the influence; her mother was asked to leave the room and be monitored by the patient's grandmother. The patient's aunt then accompanied the patient and the room throughout the rest of the appointment. We had a lengthy discussion regarding the deleterious effects of uncontrolled hyperglycemia and smoking as it relates to wound healing and overall health. She was strongly encouraged to decrease her smoking and get her diabetes under better control. She states she is currently on a diet and has cut down her [facility] consumption. The left toe is erythematous, macerated and slightly edematous with malodor present. The edema in her left foot is below her baseline, there is no erythema streaking. We will treat her with Santyl, doxycycline; we have ordered and xray, culture  and provided a Peg assist surgical shoe and cultured the wound. 01/25/18-She is here in follow-up evaluation for a left great toe ulcer and presents with an abscess to her suprapubic area. She states her blood sugars remain elevated, feeling "sick" and if levels are below 250, but she is trying. She has made no attempt to decrease her smoking stating that we "can't take away her food in her cigarettes". She has been compliant with offloading using the PEG assist you. She is using Santyl daily. the culture obtained last week grew staph aureus and Enterococcus faecalis; continues on the doxycycline and Augmentin was added on Monday. The suprapubic area has erythema, no femoral variation, purple discoloration, minimal induration, was accessed with a cotton tip applicator with sanguinopurulent drainage, this was cultured, I suspect the current antibiotic treatment will cover and we will not add anything to her current treatment plan. She was advised to go to urgent care or ER with any change in redness, induration or fever. 02/01/18-She is here in follow-up evaluation for left great toe ulcers and a new abdominal abscess from last week. She was able  to use packing until earlier this week, where she "forgot it was there". She states she was feeling ill with GI symptoms last week and was not taking her antibiotic. She states her glucose levels have been predominantly less than 200, with occasional levels between 200-250. She thinks this was contributing to her GI symptoms as they have resolved without intervention. There continues to be significant laceration to left toe, otherwise it clinically looks stable/improved. There is now less superficial opening to the lateral aspect of the great toe that was residual blister. We will transition to [facility] to all wounds, she will continue her Augmentin. If there is no change or deterioration next week for reculture. 02/08/18-She is here in follow-up  evaluation for left great toe ulcer and abdominal ulcer. There is an improvement in both wounds. She has been wrapping her left toe with coban, not by our direction, which has created an area of discoloration to the medial aspect; she has been advised to NOT use coban secondary to her neuropathy. She states her glucose levels have been high over this last week ranging from 200-350, she continues to smoke. She admits to being less compliant with her offloading shoe. We will continue with same treatment plan and she will follow-up next week. 02/15/18-She is here in follow-up evaluation for left great toe ulcer and abdominal ulcer. The abdominal ulcer is epithelialized. The left great toe ulcer is improved and all injury from last week using the Coban wrap is resolved, the lateral ulcer is healed. She admits to noncompliance with wearing offloading shoe and admits to glucose levels being greater than 300 most of the week. She continues to smoke and expresses no desire to quit. There is one area medially that probes deeper than it has historically, erythema to the toe and dorsal foot has consistently waxed and waned. There is no overt signs of cellulitis or infection but we will culture the wound for any occult infection given the new area of depth and erythema. We will hold off on Fackrell, Kip A. ([id number]) sensitivities for initiation of antibiotic therapy. 02/22/18-She is here in follow up evaluation for left great toe ulcer. There is overall significant improvement in both wound appearance, erythema and edema with changes made last week. She was not initiated on antibiotic therapy. Culture obtained last week showed oxacillin sensitive staph aureus, sensitive to clindamycin. Clindamycin has been called into the pharmacy but she has been instructed to hold off on initiation secondary to overall clinical improvement and her history of antibiotic intolerance. She has been instructed to contact the  clinic with any noted changes/deterioration and the wound, erythema, edema and/or pain. She will follow-up next week. She continues to smoke and her glucose levels remain elevated >250; she admits to compliance with offloading shoe 03/01/18 on evaluation today patient appears to be doing fairly well in regard to her left first toe ulcer. She has been tolerating the dressing changes with the [facility] Dressing without complication and overall this has definitely showed signs of improvement according to records as well is what the patient tells me today. I'm very pleased in that regard. She is having no pain today 03/08/18 She is here for follow up evaluation of a left great toe ulcer. She remains non-compliant with glucose control and smoking cessation; glucose levels consistently >200. She states that she got new shoe inserts/peg assist. She admits to compliance with offloading. Since my last evaluation there is significant improvement. We will switch to prisma at this  time and she will follow up next week. She is noted to be tachycardic at this appointment, heart rate 120s; she has a history of heart rate 70-130 according to our records. She admits to extreme agitation r/t personal issues; she was advised to monitor her heartrate and contact her physician if it does not return to a more normal range (<100). She takes cardizem twice daily. 03/15/18-She is here in follow-up evaluation for left great toe ulcer. She remains noncompliant with glucose control and smoking cessation. She admits to compliance with wearing offloading shoe. The ulcer is improved/stable and we will continue with the same treatment plan and she will follow-up next week 03/22/18-She is here for evaluation for left great toe ulcer. There continues to be significant improvement despite recurrent hyperglycemia (over 500 yesterday) and she continues to smoke. She has been compliant with offloading and we will continue with same  treatment plan and she will follow-up next week. 03/29/18-She is here for evaluation for left great toe ulcer. Despite continuing to smoke and uncontrolled diabetes she continues to improve. She is compliant with offloading shoe. We will continue with the same treatment plan and she will follow-up next week 04/05/18- She is here in follow up evaluation for a left great toe ulcer; she presents with small pustule to left fifth toe (resembles ant bite). She admits to compliance with wearing offloading shoe; continues to smoke or have uncontrolled blood glucose control. There is more callus than usual with evidence of bleeding; she denies known trauma. 04/12/18-She is here for evaluation of left great toe ulcer. Despite noncompliance with glycemic control and smoking she continues to make improvement. She continues to wear offloading shoe. The pustule, that was identified last week, to the left fifth toe is resolved. She will follow-up in 2 weeks 05/03/18-she is seen in follow-up evaluation for a left great toe ulcer. She is compliant with offloading, otherwise noncompliant with glycemic control and smoking. She has plateaued and there is minimal improvement noted. We will transition to [facility], replaced the insert to her surgical shoe and she will follow-up in one week 05/10/18- She is here in follow up evaluation for a left great toe ulcer. It appears stable despite measurement change. We will continue with same treatment plan and follow up next week. 05/24/18-She is seen in follow-up evaluation for a left great toe ulcer. She remains compliant with offloading, has made significant improvement in her diet, decreasing the amount of sugar/soda. She said her recent A1c was 10.9 which is lower than. She did see a diabetic nutritionist/educator yesterday. She continues to smoke. We will continue with the same treatment plan and she'll follow-up next week. 05/31/18- She is seen in follow-up evaluation for  left great toe ulcer. She continues to remain compliant with offloading, continues to make improvement in her diet, increasing her water and decreasing the amount of sugar/soda. She does continue to smoke with no desire to quit. We will apply Prisma to the depth and Hydrofera Blue over. We have not received insurance authorization for oasis. She will follow up next week. 06/07/18-She is seen in follow-up evaluation for left great toe ulcer. It has stalled according to today's measurements although base appears stable. She says she saw a diabetic educator yesterday; her average blood sugars are less than 300 which is an improvement for her. She continues to smoke and states "that's my next step" She continues with water over soda. We will order for xray, culture and reinstate ace wrap compression prior  to placing apligraf for next week. She is voicing no complaints or concerns. Her dressing will change to iodoflex over the next week in preparation for apligraf. 06/14/18-She is seen in follow-up evaluation for left great toe ulcer. Plain film x-ray performed last week was negative for osteomyelitis. Wound culture obtained last week grew strep B and OSSA; she is initiated on keflex and cefdinir today; there is erythema to the toe which could be from ace wrap compression, she has a history of wrapping too tight and has has been encouraged to maintain ace wraps that we place today. We will hold off on application of apligraf today, will apply next week after antibiotic therapy has been initiated. She admits today that she has resumed taking a shower with her foot/toe submerged in water, she has been reminded to keep foot/toe out of the bath water. She will be seen in follow up next week 06/21/18-she is seen in follow-up evaluation for left great toe ulcer. She is tolerating antibiotic therapy with no GI disturbance. The wound is stable. Apligraf was applied today. She has been decreasing her smoking, only  had 4 cigarettes yesterday and Newman, Reisha A. ([id number]) 1 today. She continues being more compliant in diabetic diet. She will follow-up next week for evaluation of site, if stable will remove at 2 weeks. 06/28/18- She is here in follow up evalution. Apligraf was placed last week, she states the dressing fell off on Tuesday and she was dressing with hydrofera blue. She is healed and will be discharged from the clinic today. She has been instructed to continue with smoking cessation, continue monitoring glucose levels, offloading for an additional 4 weeks and continue with hydrofera blue for additional two weeks for any possible microscopic opening. Readmission: 08/07/18 on evaluation today patient presents for reevaluation concerning the ulcer of her right great toe. She was previously discharged on 06/28/18 healed. Nonetheless she states that this began to show signs of drainage she subsequently went to her primary care provider. Subsequently an x-ray was performed on 08/01/18 which was negative. The patient was also placed on antibiotics at that time. Fortunately they should have been effective for the infection. Nonetheless she's been experiencing some improvement but still has a lot of drainage coming from the wound itself. 08/14/18 on evaluation today patient's wound actually does show signs of improvement in regard to the erythema at this point. She has completed the antibiotics. With that being said we did discuss the possibility of placing her in a total contact cast as of today although I think that I may want to give this just a little bit more time to ensure nothing recurrence as far as her infection is concerned. I do not want to put in the cast and risk infection at that time if things are not completely resolved. With that being said she is gonna require some debridement today. 08/21/18 on evaluation today patient actually appears to be doing okay in regard to her toe ulcer. She's  been tolerating the dressing changes without complication. With that being said it does appear that she is ready and in fact I think it's appropriate for [place] to go ahead and initiate the total contact cast today. Nonetheless she will require some sharp debridement to prepare the wound for application. Overall I feel like things have been progressing well but we do need to do something to get this to close more readily. 08/24/18 patient seen today for reevaluation after having had the total contact cast applied on  Tuesday. She seems to have done very well the wound appears to be doing great and overall I'm pleased with the progress that she's made. There were no abnormal areas of rubbing from the cast on her lower extremity. 08/30/18 on evaluation today patient actually appears to be completely healed in regard to her plantar toe ulcer. She tells me at this point she's been having a lot of issues with the cast. She almost fell a couple of times the state shall the step of her dog a couple times as well. This is been a very frustrating process for her other nonetheless she has completely healed the wound which is excellent news. Overall there does not appear to be the evidence of infection at this time which is great news. 09/11/18 evaluation today patient presents for follow-up concerning her great toe ulcer on the left which has unfortunately reopened since I last saw her which was only a couple of weeks ago. Unfortunately she was not able to get in to get the shoe and potentially the AFO that's gonna be necessary due to her left foot drop. She continues with offloading shoe but this is not enough to prevent her from reopening it appears. When we last had her in the total contact cast she did well from a healing standpoint but unfortunately the wound reopened as soon as she came out of the cast within just a couple of weeks. Right now the biggest concern is that I do believe the foot drop is leading to  the issue and this is gonna continue to be an issue unfortunately until we get things under control as far as the walking anomaly is concerned with the foot drop. This is also part of the reason why she falls on a regular basis. I just do not believe that is gonna be safe for [place] to reinitiate the total contact cast as last time we had this on she fell 3 times one week which is definitely not normal for her. 09/18/18 upon evaluation today the patient actually appears to be doing about the same in regard to her toe ulcer. She did not contact [facility] as I asked her to even though I had given her the prescription. In fact she actually states that she has no idea where the prescription is. She did apparently call [facility] and they told her that all she needed to do was bring the prescription in order to be able to be seen and work on getting the AFO for her left foot. With all that being said she still does not have an appointment and I'm not sure were things stand that regard. I will give her a new prescription today in order to contact them to get this set up. 09/25/18 on evaluation today patient actually appears to be doing about the same in regard to her toes ulcer. She does have a small areas which seems to have a lot of callous buildup around the edge of the wound which is going to need sharp debridement today. She still is waiting to be scheduled for evaluation with [facility] for possibility of an AFO. She states there supposed to call her tomorrow to get this set up. Unfortunately it does appear that her foot specifically the toe area is showing signs of erythema. There does not appear to be any systemic infection which is in these good news. SICILY, ZARAGOZA A. ([id number]) 10/02/18 on evaluation today patient actually appears to be doing about the same in regard to her  toe ulcer. This really has not done too well although it's not significantly larger it's also not significantly smaller. She has been  tolerating the dressing changes without complication. She actually has her appointment with [facility] and [place] tomorrow to hopefully be measured for obtaining and AFO splint. I think this would be helpful preventing this from reoccurring. We had contemplated starting the cast this week although to be honest I am reluctant to do that as she's been having nausea, vomiting, and seizure activity over the past three days. She has a history of seizures and have been told is nothing that can be done for these. With that being said I do believe that along with the seizures have the nausea vomiting which upon further questioning doesn't seem to be the normal for her and makes me concerned for the possibility of infection or something else going on. I discussed this with the patient and her mother during the office visit today. I do not feel the wound is effective but maybe something else. The responses this was "this just happens to her at times and we don't know why". They did not seem to be interested in going to the hospital to have this checked out further. 10/09/18 on evaluation today patient presents for follow-up concerning her ongoing toe ulcer. She has been tolerating the dressing changes without complication. Fortunately there does not appear to be any evidence of infection which is great news however I do think that the patient would benefit from going ahead for with the total contact cast. She's actually in a wheelchair today she tells me that she will use her walker if we initiate the cast. I was very specific about the fact that if we were gonna do the cast I wanted to make sure that she was using the walker in order to prevent any falls. She tells me she does not have stairs that she has to traverse on a regular basis at her home. She has not had any seizures since last week again that something that happens to her often she tells me she did talk to [employer] and they said that it may take up  to three weeks to get the brace approved for her. Hopefully that will not take that long but nonetheless in the meantime I do think the cast could be of benefit. 10/12/18 on evaluation today patient appears to be doing rather well in regard to her toe ulcer. It's just been a few days and already this is significantly improved both as far as overall appearance and size. Fortunately there's no sign of infection. She is here for her first obligatory cast change. 10/19/18 Seen today for follow up and management of left great toe ulcer. Wound continues to show improvement. Noted small open area with seroussang drainage with palpation. Denies any increased pain or recent fevers during visit. She will continue calcium alginate with offloading shoe. Denies any questions or concerns during visit. 10/26/18 on evaluation today patient appears to be doing about the same as when I last saw her in regard to her wound bed. Fortunately there does not appear to be any signs of infection. Unfortunately she continues to have a breakdown in regard to the toe region any time that she is not in the cast. It takes almost no time at all for this to happen. Nonetheless she still has not heard anything from the brace being made by [employer] as to when exactly this will be available to her. Fortunately there is  no signs of infection at this time. 10/30/18 on evaluation today patient presents for application of the total contact cast as we just received him this morning. Fortunately we are gonna be able to apply this to her today which is great news. She continues to have no significant pain which is good news. Overall I do feel like things have been improving while she was the cast is when she doesn't have a cast that things get worse. She still has not really heard anything from [place] regarding her brace. 11/02/18 upon evaluation today patient's wound already appears to be doing significantly better which is good  news. Fortunately there does not appear to be any signs of infection also good news. Overall I do think the total contact cast as before is helping to heal this area unfortunately it's just not gonna likely keep the area closed and healed without her getting her brace at least. Again the foot drop is a significant issue for her. 11/09/18 on evaluation today patient appears to be doing excellent in regard to her toe ulcer which in fact is completely healed. Fortunately we finally got the situation squared away with the paperwork which was needed to proceed with getting her brace approved by Medicaid. I have filled that out unfortunately that information has been sent to the orthopedic office that I worked at 2 1/2 years ago and not tired Current wound care measures. Fortunately she seems to be doing very well at this time. 11/23/18 on evaluation today patient appears to be doing More Poorly Compared to Last Time I Saw Her. At [facility] She Had Completely Healed. Currently she is continuing to have issues with reopening. She states that she just found out that the brace was approved through Medicaid now she just has to go get measured in order to have this fitted for her and then made. Subsequently she does not have an appointment for this yet that is going to complicate things we obviously cannot put her back in the cast if we do not have everything measured because they're not gonna be able to measure her foot while she is in the cast. 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 Obviously this is unfortunate and does have me somewhat worried as well. 11/30/18 on evaluation today patient's toe ulcer actually appears to be doing fairly well. The good news is she will be getting her Isidro, Amada A. ([id number]) brace in the shoes next week on Wednesday. Hopefully we will be able to get this to heal without  having to go back in the cast however she may need the cast in order to get the wound completely heal and then go from there. Fortunately there's no signs of infection at this time. 12/07/18 on evaluation today patient fortunately did receive her brace and she states she could tell this definitely makes her walk better. With that being said she's been having issues with her toe where she noticed yesterday there was a lot of tissue that was loosing off this appears to be much larger than what it was previous. She also states that her leg has been read putting much across the top of her foot just about the ankle although this seems to be receiving somewhat. The total area is still red and appears to be someone infected as best I can tell. She is previously taken Bactrim and that  may be a good option for her today as well. We are gonna see what I wound culture shows as well and I think that this is definitely appropriate. With that being said outside of the culture I still need to initiate something in the interim and that's what I'm gonna go ahead and select Bactrim is a good option for her. 12/14/18 on evaluation today patient appears to be doing better in regard to her left great toe ulcer as compared to last week's evaluation. There's still some erythema although this is significantly improved which is excellent news. Overall I do believe that she is making good progress is still gonna take some time before she is where I would like her to be from the standpoint of being able to place her back into the total contact cast. Hopefully we will be where we need to be by next week. 12/21/18 on evaluation today patient actually appears to be doing poorly in regard to her toe ulcer. She's been tolerating the dressing changes without complication. Fortunately there's no signs of systemic infection although she does have a lot of drainage from the toe ulcer and this does seem to be causing some issues at this  point. She does have erythema on the distal portion of her toe that appears to be likely cellulitis. 12/28/18 on evaluation today patient actually appears to be doing a little better in my pinion in regard to her toe ulcer. With that being said she still does have some evidence of infection at this time and for her culture she had both E. coli as well as enterococcus as organisms noted on evaluation. For that reason I think that though the Keflex likely has treated the E. coli rather well this has really done nothing for the enterococcus. We are going to have to initiate treatment for this specifically. 01/04/19 on evaluation today patient's toe actually appears to be doing better from the standpoint of infection. She currently would like to see about putting the cash back on I think that this is appropriate as long as she takes care of it and keeps it from getting wet. She is gonna have some drainage we can definitely pass this up with Drawtex and alginate to try to prevent as much drainage as possible from causing the problems. With that being said I do want to at least try her with the cast between now and Tuesday. If there any issues we can't continue to use it then I will discontinue the use of the cast at that point. 01/08/19 on evaluation today patient actually appears to be doing very well as far as her foot ulcer specifically the great toe on the left is concerned. She did have an area of rubbing on the medial aspect of her left ankle which again is from the cast. Fortunately there's no signs of infection at this point in this appears to be a very slight skin breakdown. The patient tells me she felt it rubbing but didn't think it was that bad. Fortunately there is no signs of active infection at this time which is good news. No fevers, chills, nausea, or vomiting noted at this time. 01/15/19 on evaluation today patient actually appears to be doing well in regard to her toe ulcer. Again as  previous she seems to do well and she has the cast on which indicates to me that during the time she doesn't have a cast on she's putting way too much pressure on this region. Obviously I  think that's gonna be an issue as with the current national emergency concerning the Covid-19 Virus it has been recommended that we discontinue the use of total contact casting by the chief medical officer of our company, Dr. Simona Huh. The reasoning is that if a patient becomes sick and cannot come into have the cast removed they could not just leave this on for an additional two weeks. Obviously the hospitals also do not want to receive patient's who are sick into the emergency department to potentially contaminate the region and spread the Covid-19 Virus among other sick individuals within the hospital system. Therefore at this point we are suspending the use of total contact cast until the current emergency subsides. This was all discussed with the patient today as well. 01/22/19 on evaluation today patient's wound on her left great toe appears to be doing slightly worse than previously noted last week. She tells me that she has been on this quite a bit in fact she tells me she's been awake for 38 straight hours. This is due to the fact that she's having to care for grandparents because nobody else will. She has been taking care of them for five the last seven days since I've seen her they both have dementia his is from a stroke and her grandmother's was progressive. Nonetheless she states even her mom who knows her condition and situation has only help two of those days to take care of them she's been taking care of the rest. Fortunately there does not appear to be any signs of active infection in regard to her toe at this point although obviously it doesn't look as good as it did previous. I think this is directly related to her not taking off the pressure and friction by way of taking things easy. Though I  completely understand what's going on. JEARLDEAN, GUTT ([id number]) 01/29/19 on evaluation today patient's tools are actually appears to be showing some signs of improvement today compared to last week's evaluation as far as not necessarily the overall size of the wound but the fact that she has some new skin growth in between the two ends of the wound opening. Overall I feel like she has done well she states that she had a family member give her what sounds to be a CAM walker boot which has been helpful as well. Patient History Information obtained from Patient. Social History Current every day smoker. Medical History Eyes Patient has history of Cataracts, Glaucoma, Optic Neuritis Ear/Nose/Mouth/Throat Patient has history of Chronic sinus problems/congestion, Middle ear problems Endocrine Patient has history of Type II Diabetes Review of Systems (ROS) Constitutional Symptoms ([place]) Denies complaints or symptoms of Fatigue, Fever, Chills, Marked Weight Change. Respiratory Denies complaints or symptoms of Chronic or frequent coughs, Shortness of Breath. Cardiovascular Denies complaints or symptoms of Chest pain, LE edema. Objective Constitutional Well-nourished and well-hydrated in no acute distress. Vitals Time Taken: 8:55 AM, Height: 69 in, Weight: 240 lbs, BMI: 35.4, Temperature: 98.4 F, Pulse: 92 bpm, Respiratory Rate: 18 breaths/min, Blood Pressure: 120/80 mmHg. Respiratory normal breathing without difficulty. Psychiatric this patient is able to make decisions and demonstrates good insight into disease process. Alert and Oriented x 3. pleasant and cooperative. General Notes: Upon inspection today patient's wound bed actually showed some improvement there was good skin/epithelialization noted between the central portion of the wound. I did perform sharp agreement clear way some the callous from the edge of the wound which he tolerated without any pain or  complication. Post debridement wound bed appears to be doing much better which is good news. Integumentary (Hair, Skin) Kettles, Jakaria A. ([id number]) Wound #5 status is Open. Original cause of wound was Gradually Appeared. The wound is located on the Left Toe Great. The wound measures 2.1cm length x 0.5cm width x 0.2cm depth; 0.825cm^2 area and 0.165cm^3 volume. There is Fat Layer (Subcutaneous Tissue) Exposed exposed. There is no tunneling or undermining noted. There is a medium amount of serous drainage noted. The wound margin is epibole. There is small (1-33%) pink granulation within the wound bed. There is a medium (34-66%) amount of necrotic tissue within the wound bed including Adherent Slough. The periwound skin appearance exhibited: Callus, Maceration, Erythema. The periwound skin appearance did not exhibit: Crepitus, Excoriation, Induration, Rash, Scarring, Dry/Scaly, Atrophie Blanche, Cyanosis, Ecchymosis, Hemosiderin Staining, Mottled, Pallor, Rubor. The surrounding wound skin color is noted with erythema which is circumferential. Periwound temperature was noted as No Abnormality. 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 There was a Excisional Skin/Subcutaneous Tissue Debridement with a total area of 1.05 sq cm performed by STONE III, HOYT E., PA-C. With the following instrument(s): Curette to remove Viable and Non-Viable tissue/material. Material removed includes Callus, Subcutaneous Tissue, and Slough after achieving pain control using Lidocaine 4% Topical Solution. No specimens were taken. A time out was conducted at 09:16, prior to the start of the  procedure. A Minimum amount of bleeding was controlled with Pressure. The procedure was tolerated well with a pain level of 0 throughout and a pain level of 0 following the procedure. Post Debridement Measurements: 2.1cm length x 0.5cm width x 0.3cm depth; 0.247cm^3 volume. Character of Wound/Ulcer Post Debridement is improved. Severity of Tissue Post Debridement is: Fat layer exposed. Post procedure Diagnosis Wound #5: Same as Pre-Procedure Plan Wound Cleansing: Wound #5 Left Toe Great: Clean wound with Normal Saline. Anesthetic (add to Medication List): Wound #5 Left Toe Great: Topical Lidocaine 4% cream applied to wound bed prior to debridement (In Clinic Only). Primary Wound Dressing: Wound #5 Left Toe Great: Silver Alginate Secondary Dressing: ZEYA, BALLES A. ([id number]) Wound #5 Left Toe Great: Conform/Kerlix Foam Dressing Change Frequency: Wound #5 Left Toe Great: Three times weekly Follow-up Appointments: Wound #5 Left Toe Great: Return Appointment in 1 week. Off-Loading: Wound #5 Left Toe Great: Open toe surgical shoe to: - Wear your shoe with your brace My suggestion currently is gonna be that we continue with the above wound care measures for the next week and the patient is in agreement with that plan. If anything changes or worsens in the meantime she will contact the office and let me know. Please see above for specific wound care orders. We will see patient for re-evaluation in 1 week(s) here in the clinic. If anything worsens or changes patient will contact our office for additional recommendations. In regard to the cam Walker boot I do think that she is potentially able to use this without any complications and especially if it seems to be helping which right now it feels like it may be. For that reason I'm gonna suggest that she continue to utilize that till the next time I see her and we will see how  things are doing. Electronic Signature(s) Signed: 01/29/2019  5:10:39 PM By: Worthy Keeler PA-C Entered By: Worthy Keeler on 01/29/2019 09:58:26 [name] ([id number]) -------------------------------------------------------------------------------- ROS/PFSH Details Patient Name: Heather Dear A. Date of Service: 01/29/2019 9:00 AM Medical Record Number: [id number] Patient Account Number: [id number] Date of Birth/Sex: 02-07-1976 (43 y.o. F) Treating RN: Montey Hora Primary Care Provider: Priscille Kluver Other Clinician: Referring Provider: Priscille Kluver Treating Provider/Extender: STONE III, HOYT Weeks in Treatment: 25 Information Obtained From Patient Constitutional Symptoms (General Health) Complaints and Symptoms: Negative for: Fatigue; Fever; Chills; Marked Weight Change Respiratory Complaints and Symptoms: Negative for: Chronic or frequent coughs; Shortness of Breath Cardiovascular Complaints and Symptoms: Negative for: Chest pain; LE edema Eyes Medical History: Positive for: Cataracts; Glaucoma; Optic Neuritis Ear/Nose/Mouth/Throat Medical History: Positive for: Chronic sinus problems/congestion; Middle ear problems Endocrine Medical History: Positive for: Type II Diabetes Time with diabetes: 10 years Treated with: Insulin, Oral agents Blood sugar tested every day: No Blood sugar testing results: Lunch: 350 HBO Extended History Items Ear/Nose/Mouth/Throat: Eyes: Eyes: Ear/Nose/Mouth/Throat: Chronic sinus Cataracts Glaucoma Middle ear problems problems/congestion Immunizations Pneumococcal Vaccine: Received Pneumococcal Vaccination: No Implantable Devices Hanning, Mayan A. ([id number]) No devices added Family and Social History Current every day smoker Physician Affirmation I have reviewed and agree with the above information. Electronic Signature(s) Signed: 01/29/2019 2:58:29 PM By: Montey Hora Signed: 01/29/2019 5:10:39 PM By: Worthy Keeler PA-C Entered By: Worthy Keeler on 01/29/2019 09:57:07 [name] ([id number]) -------------------------------------------------------------------------------- SuperBill Details Patient Name: Heather Dear A. Date of Service: 01/29/2019 Medical Record Number: [id number] Patient Account Number: [id number] Date of Birth/Sex: [birth year] (44 y.o. 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

## 2019-02-05 ENCOUNTER — Encounter: Payer: Medicaid Other | Admitting: Physician Assistant

## 2019-02-05 ENCOUNTER — Other Ambulatory Visit: Payer: Self-pay

## 2019-02-05 DIAGNOSIS — E11622 Type 2 diabetes mellitus with other skin ulcer: Secondary | ICD-10-CM | POA: Diagnosis not present

## 2019-02-05 NOTE — Progress Notes (Signed)
SHALECE, STAFFA ([id number]) Visit Report for 02/05/2019 Arrival Information Details Patient Name: [name], [name] Date of Service: 02/05/2019 10:30 AM Medical Record Number: [id number] Patient Account Number: [id number] Date of Birth/Sex: 01-16-1976 (43 y.o. F) Treating RN: Montey Hora Primary Care Lindalee Huizinga: Priscille Kluver Other Clinician: Referring Jayko Voorhees: Priscille Kluver Treating Jalaiya Oyster/Extender: Melburn Hake, HOYT Weeks in Treatment: 26 Visit Information History Since Last Visit Added or deleted any medications: No Patient Arrived: Wheel Chair Any new allergies or adverse reactions: No Arrival Time: 11:01 Had a fall or experienced change in Yes activities of daily living that may affect Accompanied By: self risk of falls: Transfer Assistance: None Signs or symptoms of abuse/neglect since last visito No Patient Identification Verified: Yes Hospitalized since last visit: No Secondary Verification Process Completed: Yes Implantable device outside of the clinic excluding No Patient Requires Transmission-Based No cellular tissue based products placed in the center Precautions: since last visit: Patient Has Alerts: No Has Dressing in Place as Prescribed: Yes Pain Present Now: No Electronic Signature(s) Signed: 02/05/2019 12:59:05 PM By: Montey Hora Entered By: Montey Hora on 02/05/2019 11:02:20 [name] ([id number]) -------------------------------------------------------------------------------- Encounter Discharge Information Details Patient Name: Heather Dear A. Date of Service: 02/05/2019 10:30 AM Medical Record Number: [id number] Patient Account Number: [id number] Date of Birth/Sex: 1975-12-26 (43 y.o. F) Treating RN: Montey Hora Primary Care Mahlon Gabrielle: Priscille Kluver Other Clinician: Referring Marrissa Dai: Priscille Kluver Treating Valia Wingard/Extender: STONE III, HOYT Weeks in Treatment: 72 Encounter Discharge Information Items Post Procedure Vitals Discharge  Condition: Stable Temperature (F): 98.5 Ambulatory Status: Wheelchair Pulse (bpm): 86 Discharge Destination: Home Respiratory Rate (breaths/min): 16 Transportation: Private Auto Blood Pressure (mmHg): 94/55 Accompanied By: self Schedule Follow-up Appointment: Yes Clinical Summary of Care: Electronic Signature(s) Signed: 02/05/2019 12:59:05 PM By: Montey Hora Entered By: Montey Hora on 02/05/2019 11:29:02 [name] ([id number]) -------------------------------------------------------------------------------- Lower Extremity Assessment Details Patient Name: Heather Dear A. Date of Service: 02/05/2019 10:30 AM Medical Record Number: [id number] Patient Account Number: [id number] Date of Birth/Sex: 03-21-1976 (43 y.o. F) Treating RN: Montey Hora Primary Care Karysa Heft: Priscille Kluver Other Clinician: Referring Launa Goedken: Priscille Kluver Treating Taziah Difatta/Extender: STONE III, HOYT Weeks in Treatment: 26 Edema Assessment Assessed: [Left: No] [Right: No] Edema: [Left: N] [Right: o] Vascular Assessment Pulses: Dorsalis Pedis Palpable: [Left:Yes] Electronic Signature(s) Signed: 02/05/2019 12:59:05 PM By: Montey Hora Entered By: Montey Hora on 02/05/2019 11:12:02 Bergstresser, [place] ([id number]) -------------------------------------------------------------------------------- Multi Wound Chart Details Patient Name: Heather Dear A. Date of Service: 02/05/2019 10:30 AM Medical Record Number: [id number] Patient Account Number: [id number] Date of Birth/Sex: 1976/08/20 (43 y.o. F) Treating RN: Montey Hora Primary Care Angas Isabell: Priscille Kluver Other Clinician: Referring Taliya Mcclard: Priscille Kluver Treating Jshaun Abernathy/Extender: STONE III, HOYT Weeks in Treatment: 26 Vital Signs Height(in): 69 Pulse(bpm): 86 Weight(lbs): 240 Blood Pressure(mmHg): 94/55 Body Mass Index(BMI): 35 Temperature(F): 98.5 Respiratory Rate 16 (breaths/min): Photos: [N/A:N/A] Wound Location: Left  Toe Great N/A N/A Wounding Event: Gradually Appeared N/A N/A Primary Etiology: Diabetic Wound/Ulcer of the N/A N/A Lower Extremity Comorbid History: Cataracts, Glaucoma, Optic N/A N/A Neuritis, Chronic sinus problems/congestion, Middle ear problems, Type II Diabetes Date Acquired: 11/16/2018 N/A N/A Weeks of Treatment: 10 N/A N/A Wound Status: Open N/A N/A Measurements L x W x D 2.2x0.6x0.2 N/A N/A (cm) Area (cm) : 1.037 N/A N/A Volume (cm) : 0.207 N/A N/A % Reduction in Area: -163.90% N/A N/A % Reduction in Volume: -31.80% N/A N/A Classification: Grade 3 N/A N/A Exudate Amount: Medium N/A N/A Exudate Type: Serous N/A N/A Exudate Color: amber N/A N/A Wound Margin:  Epibole N/A N/A Granulation Amount: Large (67-100%) N/A N/A Granulation Quality: Pink, Hyper-granulation N/A N/A Necrotic Amount: Small (1-33%) N/A N/A Exposed Structures: Fat Layer (Subcutaneous N/A N/A Tissue) Exposed: Yes Fascia: No Tendon: No [name], [name] ([id number]) Muscle: No Joint: No Bone: No Epithelialization: None N/A N/A Periwound Skin Texture: Callus: Yes N/A N/A Excoriation: No Induration: No Crepitus: No Rash: No Scarring: No Periwound Skin Moisture: Maceration: Yes N/A N/A Dry/Scaly: No Periwound Skin Color: Erythema: Yes N/A N/A Atrophie Blanche: No Cyanosis: No Ecchymosis: No Hemosiderin Staining: No Mottled: No Pallor: No Rubor: No Erythema Location: Circumferential N/A N/A Temperature: No Abnormality N/A N/A Tenderness on Palpation: No N/A N/A Treatment Notes Electronic Signature(s) Signed: 02/05/2019 12:59:05 PM By: Montey Hora Entered By: Montey Hora on 02/05/2019 11:12:14 [name] ([id number]) -------------------------------------------------------------------------------- [place] Details Patient Name: Heather Dear A. Date of Service: 02/05/2019 10:30 AM Medical Record Number: [id number] Patient Account Number: [id number] Date  of Birth/Sex: [birth year] (43 y.o. F) Treating RN: Montey Hora Primary Care Emila Steinhauser: Priscille Kluver Other Clinician: Referring Jeraldin Fesler: Priscille Kluver Treating Chaquetta Schlottman/Extender: Melburn Hake, HOYT Weeks in Treatment: 26 Active Inactive Abuse / Safety / Falls / Self Care Management Nursing Diagnoses: History of Falls Goals: Patient will not experience any injury related to falls Date Initiated: 08/07/2018 Target Resolution Date: 10/19/2018 Goal Status: Active Interventions: Assess fall risk on admission and as needed Notes: Orientation to the Wound Care Program Nursing Diagnoses: Knowledge deficit related to the wound healing center program Goals: Patient/caregiver will verbalize understanding of the [facility] Date Initiated: 08/07/2018 Target Resolution Date: 10/19/2018 Goal Status: Active Interventions: Provide education on orientation to the wound center Notes: Soft Tissue Infection Nursing Diagnoses: Impaired tissue integrity Goals: Patient will remain free of wound infection Date Initiated: 08/07/2018 Target Resolution Date: 10/19/2018 Goal Status: Active Interventions: Assess signs and symptoms of infection every visit [name], [name] Notes: Wound/Skin Impairment Nursing Diagnoses: Impaired tissue integrity Goals: Ulcer/skin breakdown will heal within 14 weeks Date Initiated: 08/07/2018 Target Resolution Date: 10/19/2018 Goal Status: Active Interventions: Assess patient/caregiver ability to obtain necessary supplies Notes: Electronic Signature(s) Signed: 02/05/2019 12:59:05 PM By: Montey Hora Entered By: Montey Hora on 02/05/2019 11:12:08 [name] ([id number]) -------------------------------------------------------------------------------- Pain Assessment Details Patient Name: Heather Dear A. Date of Service: 02/05/2019 10:30 AM Medical Record Number: [id number] Patient Account Number: [id number] Date of  Birth/Sex: Oct 03, 1976 (43 y.o. F) Treating RN: Montey Hora Primary Care Jacia Sickman: Priscille Kluver Other Clinician: Referring Davian Hanshaw: Priscille Kluver Treating Brittiany Wiehe/Extender: STONE III, HOYT Weeks in Treatment: 26 Active Problems Location of Pain Severity and Description of Pain Patient Has Paino No Site Locations Pain Management and Medication Current Pain Management: Electronic Signature(s) Signed: 02/05/2019 12:59:05 PM By: Montey Hora Entered By: Montey Hora on 02/05/2019 11:02:26 [name] ([id number]) -------------------------------------------------------------------------------- Patient/Caregiver Education Details Patient Name: Heather Dear A. Date of Service: 02/05/2019 10:30 AM Medical Record Number: [id number] Patient Account Number: [id number] Date of Birth/Gender: 1976/09/11 (43 y.o. F) Treating RN: Montey Hora Primary Care Physician: Priscille Kluver Other Clinician: Referring Physician: Priscille Kluver Treating Physician/Extender: Sharalyn Ink in Treatment: 6 Education Assessment Education Provided To: Patient Education Topics Provided Offloading: Handouts: Other: continue offloading Methods: Explain/Verbal Responses: State content correctly Electronic Signature(s) Signed: 02/05/2019 12:59:05 PM By: Montey Hora Entered By: Montey Hora on 02/05/2019 11:28:00 [name], [name] ([id number]) -------------------------------------------------------------------------------- Wound Assessment Details Patient Name: Heather Dear A. Date of Service: 02/05/2019 10:30 AM Medical Record Number: [id number] Patient Account Number: [id number] Date of Birth/Sex: 10-02-1976 (42  y.o. F) Treating RN: Montey Hora Primary Care Xane Amsden: Priscille Kluver Other Clinician: Referring Marlies Ligman: Priscille Kluver Treating Marisa Hufstetler/Extender: STONE III, HOYT Weeks in Treatment: 26 Wound Status Wound Number: 5 Primary Diabetic Wound/Ulcer of the Lower  Extremity Etiology: Wound Location: Left Toe Great Wound Open Wounding Event: Gradually Appeared Status: Date Acquired: 11/16/2018 Comorbid Cataracts, Glaucoma, Optic Neuritis, Chronic Weeks Of Treatment: 10 History: sinus problems/congestion, Middle ear problems, Clustered Wound: No Type II Diabetes Photos Wound Measurements Length: (cm) 2.2 Width: (cm) 0.6 Depth: (cm) 0.2 Area: (cm) 1.037 Volume: (cm) 0.207 % Reduction in Area: -163.9% % Reduction in Volume: -31.8% Epithelialization: None Tunneling: No Undermining: No Wound Description Classification: Grade 3 Foul Odor Wound Margin: Epibole Slough/Fib Exudate Amount: Medium Exudate Type: Serous Exudate Color: amber After Cleansing: No rino No Wound Bed Granulation Amount: Large (67-100%) Exposed Structure Granulation Quality: Pink, Hyper-granulation Fascia Exposed: No Necrotic Amount: Small (1-33%) Fat Layer (Subcutaneous Tissue) Exposed: Yes Necrotic Quality: Adherent Slough Tendon Exposed: No Muscle Exposed: No Joint Exposed: No Bone Exposed: No Periwound Skin Texture Texture Color [place], [name] ([id number]) No Abnormalities Noted: No No Abnormalities Noted: No Callus: Yes Atrophie Blanche: No Crepitus: No Cyanosis: No Excoriation: No Ecchymosis: No Induration: No Erythema: Yes Rash: No Erythema Location: Circumferential Scarring: No Hemosiderin Staining: No Mottled: No Moisture Pallor: No No Abnormalities Noted: No Rubor: No Dry / Scaly: No Maceration: Yes Temperature / Pain Temperature: No Abnormality Treatment Notes Wound #5 (Left Toe Great) Notes silver alginate, foam, conform, medipore tape. Electronic Signature(s) Signed: 02/05/2019 12:59:05 PM By: Montey Hora Entered By: Montey Hora on 02/05/2019 11:10:24 [name] ([id number]) -------------------------------------------------------------------------------- Vitals Details Patient Name: Heather Dear A. Date  of Service: 02/05/2019 10:30 AM Medical Record Number: [id number] Patient Account Number: [id number] Date of Birth/Sex: 03/12/1976 (43 y.o. F) Treating RN: Montey Hora Primary Care Mazell Aylesworth: Priscille Kluver Other Clinician: Referring Oseias Horsey: Priscille Kluver Treating Melodee Lupe/Extender: STONE III, HOYT Weeks in Treatment: 26 Vital Signs Time Taken: 11:03 Temperature (F): 98.5 Height (in): 69 Pulse (bpm): 86 Weight (lbs): 240 Respiratory Rate (breaths/min): 16 Body Mass Index (BMI): 35.4 Blood Pressure (mmHg): 94/55 Reference Range: 80 - 120 mg / dl Electronic Signature(s) Signed: 02/05/2019 12:59:05 PM By: Montey Hora Entered By: Montey Hora on 02/05/2019 11:03:25

## 2019-02-05 NOTE — Progress Notes (Signed)
KALEEA, PENNER ([id number]) Visit Report for 02/05/2019 Fall Risk Assessment Details Patient Name: KANDYCE, DIEGUEZ A. Date of Service: 02/05/2019 10:30 AM Medical Record Number: [id number] Patient Account Number: [id number] Date of Birth/Sex: 1975/11/25 (43 y.o. F) Treating RN: Montey Hora Primary Care Tinlee Navarrette: Priscille Kluver Other Clinician: Referring Jolly Bleicher: Priscille Kluver Treating Tayte Childers/Extender: STONE III, HOYT Weeks in Treatment: 26 Fall Risk Assessment Items Have you had 2 or more falls in the last 12 monthso 0 Yes Have you had any fall that resulted in injury in the last 12 monthso 0 Yes FALL RISK ASSESSMENT: History of falling - immediate or within 3 months 25 Yes Secondary diagnosis 0 No Ambulatory aid None/bed rest/wheelchair/nurse 0 No Crutches/cane/walker 15 Yes Furniture 0 No IV Access/Saline Lock 0 No Gait/Training Normal/bed rest/immobile 0 No Weak 10 Yes Impaired 20 Yes Mental Status Oriented to own ability 0 Yes Electronic Signature(s) Signed: 02/05/2019 12:59:05 PM By: Montey Hora Entered By: Montey Hora on 02/05/2019 11:12:39

## 2019-02-06 NOTE — Progress Notes (Signed)
[name], [name] ([id number]) Visit Report for 02/05/2019 Chief Complaint Document Details Patient Name: [name], [name] Date of Service: 02/05/2019 10:30 AM Medical Record Number: [id number] Patient Account Number: [id number] Date of Birth/Sex: 05/11/1976 (43 y.o. F) Treating RN: Montey Hora Primary Care Provider: Priscille Kluver Other Clinician: Referring Provider: Priscille Kluver Treating Provider/Extender: Melburn Hake, HOYT Weeks in Treatment: 26 Information Obtained from: Patient Chief Complaint Left great toe ulcer Electronic Signature(s) Signed: 02/05/2019 1:21:29 PM By: Worthy Keeler PA-C Entered By: Worthy Keeler on 02/05/2019 10:30:00 [name] ([id number]) -------------------------------------------------------------------------------- Debridement Details Patient Name: Heather Dear A. Date of Service: 02/05/2019 10:30 AM Medical Record Number: [id number] Patient Account Number: [id number] Date of Birth/Sex: 20-Dec-1975 (43 y.o. F) Treating RN: Montey Hora Primary Care Provider: Priscille Kluver Other Clinician: Referring Provider: Priscille Kluver Treating Provider/Extender: STONE III, HOYT Weeks in Treatment: 26 Debridement Performed for Wound #5 Left Toe Great Assessment: Performed By: Physician STONE III, HOYT E., PA-C Debridement Type: Debridement Severity of Tissue Pre Fat layer exposed Debridement: Level of Consciousness (Pre- Awake and Alert procedure): Pre-procedure Verification/Time Yes - 11:24 Out Taken: Start Time: 11:24 Pain Control: Lidocaine 4% Topical Solution Total Area Debrided (L x W): 2.2 (cm) x 0.6 (cm) = 1.32 (cm) Tissue and other material Viable, Non-Viable, Callus, Slough, Subcutaneous, Slough debrided: Level: Skin/Subcutaneous Tissue Debridement Description: Excisional Instrument: Curette Bleeding: Minimum Hemostasis Achieved: Pressure End Time: 11:27 Procedural Pain: 0 Post Procedural Pain: 0 Response to Treatment: Procedure  was tolerated well Level of Consciousness Awake and Alert (Post-procedure): Post Debridement Measurements of Total Wound Length: (cm) 2.2 Width: (cm) 0.6 Depth: (cm) 0.3 Volume: (cm) 0.311 Character of Wound/Ulcer Post Debridement: Improved Severity of Tissue Post Debridement: Fat layer exposed Post Procedure Diagnosis Same as Pre-procedure Electronic Signature(s) Signed: 02/05/2019 12:59:05 PM By: Montey Hora Signed: 02/05/2019 1:21:29 PM By: Worthy Keeler PA-C Entered By: Montey Hora on 02/05/2019 11:27:40 Stachnik, Kloey A. ([id number]) -------------------------------------------------------------------------------- HPI Details Patient Name: Heather Dear A. Date of Service: 02/05/2019 10:30 AM Medical Record Number: [id number] Patient Account Number: [id number] Date of Birth/Sex: [birth year] (43 y.o. F) Treating RN: Montey Hora Primary Care Provider: Priscille Kluver Other Clinician: Referring Provider: Priscille Kluver Treating Provider/Extender: Melburn Hake, HOYT Weeks in Treatment: 26 History of Present Illness HPI Description: 01/18/18-She is here for initial evaluation of the left great toe ulcer. She is a poor historian in regards to timeframe in detail. She states approximately 4 weeks ago she lacerated her toe on something in the house. She followed up with her primary care who placed her on Bactrim and ultimately a second dose of Bactrim prior to coming to wound clinic. She states she has been treating the toe with peroxide, Betadine and a Band-Aid. She did not check her blood sugar this morning but checked it yesterday morning it was 327; she is unaware of a recent A1c and there are no current records. She saw Dr. she would've orthopedics last week for an old injury to the left ankle, she states he did not see her toe, nor did she bring it to his attention. She smokes approximately 1 pack cigarettes a day. Her social situation is concerning, she arrives this morning with  her mother who appears extremely intoxicated/under the influence; her mother was asked to leave the room and be monitored by the patient's grandmother. The patient's aunt then accompanied the patient and the room throughout the rest of the appointment. We had a lengthy discussion regarding the deleterious effects of uncontrolled hyperglycemia and smoking  as it relates to wound healing and overall health. She was strongly encouraged to decrease her smoking and get her diabetes under better control. She states she is currently on a diet and has cut down her [facility] consumption. The left toe is erythematous, macerated and slightly edematous with malodor present. The edema in her left foot is below her baseline, there is no erythema streaking. We will treat her with Santyl, doxycycline; we have ordered and xray, culture and provided a Peg assist surgical shoe and cultured the wound. 01/25/18-She is here in follow-up evaluation for a left great toe ulcer and presents with an abscess to her suprapubic area. She states her blood sugars remain elevated, feeling "sick" and if levels are below 250, but she is trying. She has made no attempt to decrease her smoking stating that we "can't take away her food in her cigarettes". She has been compliant with offloading using the PEG assist you. She is using Santyl daily. the culture obtained last week grew staph aureus and Enterococcus faecalis; continues on the doxycycline and Augmentin was added on Monday. The suprapubic area has erythema, no femoral variation, purple discoloration, minimal induration, was accessed with a cotton tip applicator with sanguinopurulent drainage, this was cultured, I suspect the current antibiotic treatment will cover and we will not add anything to her current treatment plan. She was advised to go to urgent care or ER with any change in redness, induration or fever. 02/01/18-She is here in follow-up evaluation for left great toe  ulcers and a new abdominal abscess from last week. She was able to use packing until earlier this week, where she "forgot it was there". She states she was feeling ill with GI symptoms last week and was not taking her antibiotic. She states her glucose levels have been predominantly less than 200, with occasional levels between 200-250. She thinks this was contributing to her GI symptoms as they have resolved without intervention. There continues to be significant laceration to left toe, otherwise it clinically looks stable/improved. There is now less superficial opening to the lateral aspect of the great toe that was residual blister. We will transition to [facility] to all wounds, she will continue her Augmentin. If there is no change or deterioration next week for reculture. 02/08/18-She is here in follow-up evaluation for left great toe ulcer and abdominal ulcer. There is an improvement in both wounds. She has been wrapping her left toe with coban, not by our direction, which has created an area of discoloration to the medial aspect; she has been advised to NOT use coban secondary to her neuropathy. She states her glucose levels have been high over this last week ranging from 200-350, she continues to smoke. She admits to being less compliant with her offloading shoe. We will continue with same treatment plan and she will follow-up next week. 02/15/18-She is here in follow-up evaluation for left great toe ulcer and abdominal ulcer. The abdominal ulcer is epithelialized. The left great toe ulcer is improved and all injury from last week using the Coban wrap is resolved, the lateral ulcer is healed. She admits to noncompliance with wearing offloading shoe and admits to glucose levels being greater than 300 most of the week. She continues to smoke and expresses no desire to quit. There is one area medially that probes deeper than it has historically, erythema to the toe and dorsal foot has  consistently waxed and waned. There is no overt signs of cellulitis or infection but we  will culture the wound for any occult infection given the new area of depth and erythema. We will hold off on sensitivities for initiation of antibiotic therapy. 02/22/18-She is here in follow up evaluation for left great toe ulcer. There is overall significant improvement in both wound appearance, erythema and edema with changes made last week. She was not initiated on antibiotic therapy. Culture obtained last week showed oxacillin sensitive staph aureus, sensitive to clindamycin. Clindamycin has been called into the pharmacy but she has been instructed to hold off on initiation secondary to overall clinical improvement and her history of antibiotic intolerance. She has been instructed to contact the clinic with any noted changes/deterioration and the wound, erythema, Bevard, Narcissa A. ([id number]) edema and/or pain. She will follow-up next week. She continues to smoke and her glucose levels remain elevated >250; she admits to compliance with offloading shoe 03/01/18 on evaluation today patient appears to be doing fairly well in regard to her left first toe ulcer. She has been tolerating the dressing changes with the [facility] Dressing without complication and overall this has definitely showed signs of improvement according to records as well is what the patient tells me today. I'm very pleased in that regard. She is having no pain today 03/08/18 She is here for follow up evaluation of a left great toe ulcer. She remains non-compliant with glucose control and smoking cessation; glucose levels consistently >200. She states that she got new shoe inserts/peg assist. She admits to compliance with offloading. Since my last evaluation there is significant improvement. We will switch to prisma at this time and she will follow up next week. She is noted to be tachycardic at this appointment, heart rate 120s; she has a  history of heart rate 70-130 according to our records. She admits to extreme agitation r/t personal issues; she was advised to monitor her heartrate and contact her physician if it does not return to a more normal range (<100). She takes cardizem twice daily. 03/15/18-She is here in follow-up evaluation for left great toe ulcer. She remains noncompliant with glucose control and smoking cessation. She admits to compliance with wearing offloading shoe. The ulcer is improved/stable and we will continue with the same treatment plan and she will follow-up next week 03/22/18-She is here for evaluation for left great toe ulcer. There continues to be significant improvement despite recurrent hyperglycemia (over 500 yesterday) and she continues to smoke. She has been compliant with offloading and we will continue with same treatment plan and she will follow-up next week. 03/29/18-She is here for evaluation for left great toe ulcer. Despite continuing to smoke and uncontrolled diabetes she continues to improve. She is compliant with offloading shoe. We will continue with the same treatment plan and she will follow-up next week 04/05/18- She is here in follow up evaluation for a left great toe ulcer; she presents with small pustule to left fifth toe (resembles ant bite). She admits to compliance with wearing offloading shoe; continues to smoke or have uncontrolled blood glucose control. There is more callus than usual with evidence of bleeding; she denies known trauma. 04/12/18-She is here for evaluation of left great toe ulcer. Despite noncompliance with glycemic control and smoking she continues to make improvement. She continues to wear offloading shoe. The pustule, that was identified last week, to the left fifth toe is resolved. She will follow-up in 2 weeks 05/03/18-she is seen in follow-up evaluation for a left great toe ulcer. She is compliant with offloading, otherwise noncompliant with  glycemic control  and smoking. She has plateaued and there is minimal improvement noted. We will transition to [facility], replaced the insert to her surgical shoe and she will follow-up in one week 05/10/18- She is here in follow up evaluation for a left great toe ulcer. It appears stable despite measurement change. We will continue with same treatment plan and follow up next week. 05/24/18-She is seen in follow-up evaluation for a left great toe ulcer. She remains compliant with offloading, has made significant improvement in her diet, decreasing the amount of sugar/soda. She said her recent A1c was 10.9 which is lower than. She did see a diabetic nutritionist/educator yesterday. She continues to smoke. We will continue with the same treatment plan and she'll follow-up next week. 05/31/18- She is seen in follow-up evaluation for left great toe ulcer. She continues to remain compliant with offloading, continues to make improvement in her diet, increasing her water and decreasing the amount of sugar/soda. She does continue to smoke with no desire to quit. We will apply Prisma to the depth and Hydrofera Blue over. We have not received insurance authorization for oasis. She will follow up next week. 06/07/18-She is seen in follow-up evaluation for left great toe ulcer. It has stalled according to today's measurements although base appears stable. She says she saw a diabetic educator yesterday; her average blood sugars are less than 300 which is an improvement for her. She continues to smoke and states "that's my next step" She continues with water over soda. We will order for xray, culture and reinstate ace wrap compression prior to placing apligraf for next week. She is voicing no complaints or concerns. Her dressing will change to iodoflex over the next week in preparation for apligraf. 06/14/18-She is seen in follow-up evaluation for left great toe ulcer. Plain film x-ray performed last week was negative  for osteomyelitis. Wound culture obtained last week grew strep B and OSSA; she is initiated on keflex and cefdinir today; there is erythema to the toe which could be from ace wrap compression, she has a history of wrapping too tight and has has been encouraged to maintain ace wraps that we place today. We will hold off on application of apligraf today, will apply next week after antibiotic therapy has been initiated. She admits today that she has resumed taking a shower with her foot/toe submerged in water, she has been reminded to keep foot/toe out of the bath water. She will be seen in follow up next week 06/21/18-she is seen in follow-up evaluation for left great toe ulcer. She is tolerating antibiotic therapy with no GI disturbance. The wound is stable. Apligraf was applied today. She has been decreasing her smoking, only had 4 cigarettes yesterday and 1 today. She continues being more compliant in diabetic diet. She will follow-up next week for evaluation of site, if stable will remove at 2 weeks. 06/28/18- She is here in follow up evalution. Apligraf was placed last week, she states the dressing fell off on Tuesday and she was dressing with hydrofera blue. She is healed and will be discharged from the clinic today. She has been instructed to continue with smoking cessation, continue monitoring glucose levels, offloading for an additional 4 weeks and continue with Swanger, Daisie A. ([id number]) hydrofera blue for additional two weeks for any possible microscopic opening. Readmission: 08/07/18 on evaluation today patient presents for reevaluation concerning the ulcer of her right great toe. She was previously discharged on 06/28/18 healed. Nonetheless she states that this  began to show signs of drainage she subsequently went to her primary care provider. Subsequently an x-ray was performed on 08/01/18 which was negative. The patient was also placed on antibiotics at that time. Fortunately they should  have been effective for the infection. Nonetheless she's been experiencing some improvement but still has a lot of drainage coming from the wound itself. 08/14/18 on evaluation today patient's wound actually does show signs of improvement in regard to the erythema at this point. She has completed the antibiotics. With that being said we did discuss the possibility of placing her in a total contact cast as of today although I think that I may want to give this just a little bit more time to ensure nothing recurrence as far as her infection is concerned. I do not want to put in the cast and risk infection at that time if things are not completely resolved. With that being said she is gonna require some debridement today. 08/21/18 on evaluation today patient actually appears to be doing okay in regard to her toe ulcer. She's been tolerating the dressing changes without complication. With that being said it does appear that she is ready and in fact I think it's appropriate for [place] to go ahead and initiate the total contact cast today. Nonetheless she will require some sharp debridement to prepare the wound for application. Overall I feel like things have been progressing well but we do need to do something to get this to close more readily. 08/24/18 patient seen today for reevaluation after having had the total contact cast applied on Tuesday. She seems to have done very well the wound appears to be doing great and overall I'm pleased with the progress that she's made. There were no abnormal areas of rubbing from the cast on her lower extremity. 08/30/18 on evaluation today patient actually appears to be completely healed in regard to her plantar toe ulcer. She tells me at this point she's been having a lot of issues with the cast. She almost fell a couple of times the state shall the step of her dog a couple times as well. This is been a very frustrating process for her other nonetheless she has  completely healed the wound which is excellent news. Overall there does not appear to be the evidence of infection at this time which is great news. 09/11/18 evaluation today patient presents for follow-up concerning her great toe ulcer on the left which has unfortunately reopened since I last saw her which was only a couple of weeks ago. Unfortunately she was not able to get in to get the shoe and potentially the AFO that's gonna be necessary due to her left foot drop. She continues with offloading shoe but this is not enough to prevent her from reopening it appears. When we last had her in the total contact cast she did well from a healing standpoint but unfortunately the wound reopened as soon as she came out of the cast within just a couple of weeks. 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 This is also part of the reason why she falls on a regular basis. I just do not believe that is gonna be safe for [place] to reinitiate the total contact cast as last time we had  this on she fell 3 times one week which is definitely not normal for her. 09/18/18 upon evaluation today the patient actually appears to be doing about the same in regard to her toe ulcer. She did not contact [employer] as I asked her to even though I had given her the prescription. In fact she actually states that she has no idea where the prescription is. She did apparently call [employer] and they told her that all she needed to do was bring the prescription in order to be able to be seen and work on getting the AFO for her left foot. With all that being said she still does not have an appointment and I'm not sure were things stand that regard. I will give her a new prescription today in order to contact them to get this set up. 09/25/18 on  evaluation today patient actually appears to be doing about the same in regard to her toes ulcer. She does have a small areas which seems to have a lot of callous buildup around the edge of the wound which is going to need sharp debridement today. She still is waiting to be scheduled for evaluation with [employer] for possibility of an AFO. She states there supposed to call her tomorrow to get this set up. Unfortunately it does appear that her foot specifically the toe area is showing signs of erythema. There does not appear to be any systemic infection which is in these good news. 10/02/18 on evaluation today patient actually appears to be doing about the same in regard to her toe ulcer. This really has not done too well although it's not significantly larger it's also not significantly smaller. She has been tolerating the dressing changes without complication. She actually has her appointment with [employer] and [place] tomorrow to hopefully be measured for obtaining and AFO splint. I think this would be helpful preventing this from reoccurring. We had contemplated starting the cast this week although to be honest I am reluctant to do that as she's been having nausea, vomiting, and seizure activity over the past three days. She has a history of seizures and have been told is nothing that can be done for these. With Monceaux, Raziah A. ([id number]) that being said I do believe that along with the seizures have the nausea vomiting which upon further questioning doesn't seem to be the normal for her and makes me concerned for the possibility of infection or something else going on. I discussed this with the patient and her mother during the office visit today. I do not feel the wound is effective but maybe something else. The responses this was "this just happens to her at times and we don't know why". They did not seem to be interested in going to the hospital to have this checked out further. 10/09/18 on  evaluation today patient presents for follow-up concerning her ongoing toe ulcer. She has been tolerating the dressing changes without complication. Fortunately there does not appear to be any evidence of infection which is great news however I do think that the patient would benefit from going ahead for with the total contact cast. She's actually in a wheelchair today she tells me that she will use her walker if we initiate the cast. I was very specific about the fact that if we were gonna do the cast I wanted to make sure that she was using the walker in order to prevent any falls. She tells me she does not have stairs that she  has to traverse on a regular basis at her home. She has not had any seizures since last week again that something that happens to her often she tells me she did talk to [employer] and they said that it may take up to three weeks to get the brace approved for her. Hopefully that will not take that long but nonetheless in the meantime I do think the cast could be of benefit. 10/12/18 on evaluation today patient appears to be doing rather well in regard to her toe ulcer. It's just been a few days and already this is significantly improved both as far as overall appearance and size. Fortunately there's no sign of infection. She is here for her first obligatory cast change. 10/19/18 Seen today for follow up and management of left great toe ulcer. Wound continues to show improvement. Noted small open area with seroussang drainage with palpation. Denies any increased pain or recent fevers during visit. She will continue calcium alginate with offloading shoe. Denies any questions or concerns during visit. 10/26/18 on evaluation today patient appears to be doing about the same as when I last saw her in regard to her wound bed. Fortunately there does not appear to be any signs of infection. Unfortunately she continues to have a breakdown in regard to the toe region any time that she is  not in the cast. It takes almost no time at all for this to happen. Nonetheless she still has not heard anything from the brace being made by [employer] as to when exactly this will be available to her. Fortunately there is no signs of infection at this time. 10/30/18 on evaluation today patient presents for application of the total contact cast as we just received him this morning. Fortunately we are gonna be able to apply this to her today which is great news. She continues to have no significant pain which is good news. Overall I do feel like things have been improving while she was the cast is when she doesn't have a cast that things get worse. She still has not really heard anything from [place] regarding her brace. 11/02/18 upon evaluation today patient's wound already appears to be doing significantly better which is good news. Fortunately there does not appear to be any signs of infection also good news. Overall I do think the total contact cast as before is helping to heal this area unfortunately it's just not gonna likely keep the area closed and healed without her getting her brace at least. Again the foot drop is a significant issue for her. 11/09/18 on evaluation today patient appears to be doing excellent in regard to her toe ulcer which in fact is completely healed. Fortunately we finally got the situation squared away with the paperwork which was needed to proceed with getting her brace approved by Medicaid. I have filled that out unfortunately that information has been sent to the orthopedic office that I worked at 2 1/2 years ago and not tired Current wound care measures. Fortunately she seems to be doing very well at this time. 11/23/18 on evaluation today patient appears to be doing More Poorly Compared to Last Time I Saw Her. At [facility] She Had Completely Healed. Currently she is continuing to have issues with reopening. She states that she just found out that the brace was approved  through Medicaid now she just has to go get measured in order to have this fitted for her and then made. Subsequently she does not have an  appointment for this yet that is going to complicate things we obviously cannot put her back in the cast if we do not have everything measured because they're not gonna be able to measure her foot while she is in the cast. 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 Obviously this is unfortunate and does have me somewhat worried as well. 11/30/18 on evaluation today patient's toe ulcer actually appears to be doing fairly well. The good news is she will be getting her brace in the shoes next week on Wednesday. Hopefully we will be able to get this to heal without having to go back in the cast however she may need the cast in order to get the wound completely heal and then go from there. Fortunately there's no signs of infection at this time. 12/07/18 on evaluation today patient fortunately did receive her brace and she states she could tell this definitely makes her walk better. With that being said she's been having issues with her toe where she noticed yesterday there was a lot of tissue Criado, Luna A. ([id number]) that was loosing off this appears to be much larger than what it was previous. She also states that her leg has been read putting much across the top of her foot just about the ankle although this seems to be receiving somewhat. The total area is still red and appears to be someone infected as best I can tell. She is previously taken Bactrim and that may be a good option for her today as well. We are gonna see what I wound culture shows as well and I think that this is definitely appropriate. With that being said outside of the culture I still need to initiate something in the interim and that's what I'm gonna go ahead and select Bactrim is a good option  for her. 12/14/18 on evaluation today patient appears to be doing better in regard to her left great toe ulcer as compared to last week's evaluation. There's still some erythema although this is significantly improved which is excellent news. Overall I do believe that she is making good progress is still gonna take some time before she is where I would like her to be from the standpoint of being able to place her back into the total contact cast. Hopefully we will be where we need to be by next week. 12/21/18 on evaluation today patient actually appears to be doing poorly in regard to her toe ulcer. She's been tolerating the dressing changes without complication. Fortunately there's no signs of systemic infection although she does have a lot of drainage from the toe ulcer and this does seem to be causing some issues at this point. She does have erythema on the distal portion of her toe that appears to be likely cellulitis. 12/28/18 on evaluation today patient actually appears to be doing a little better in my pinion in regard to her toe ulcer. With that being said she still does have some evidence of infection at this time and for her culture she had both E. coli as well as enterococcus as organisms noted on evaluation. For that reason I think that though the Keflex likely has treated the E. coli rather well this has really done nothing for the enterococcus. We are going to have to initiate treatment for this specifically. 01/04/19 on evaluation today patient's toe actually appears to be doing  better from the standpoint of infection. She currently would like to see about putting the cash back on I think that this is appropriate as long as she takes care of it and keeps it from getting wet. She is gonna have some drainage we can definitely pass this up with Drawtex and alginate to try to prevent as much drainage as possible from causing the problems. With that being said I do want to at least try her with  the cast between now and Tuesday. If there any issues we can't continue to use it then I will discontinue the use of the cast at that point. 01/08/19 on evaluation today patient actually appears to be doing very well as far as her foot ulcer specifically the great toe on the left is concerned. She did have an area of rubbing on the medial aspect of her left ankle which again is from the cast. Fortunately there's no signs of infection at this point in this appears to be a very slight skin breakdown. The patient tells me she felt it rubbing but didn't think it was that bad. Fortunately there is no signs of active infection at this time which is good news. No fevers, chills, nausea, or vomiting noted at this time. 01/15/19 on evaluation today patient actually appears to be doing well in regard to her toe ulcer. Again as previous she seems to do well and she has the cast on which indicates to me that during the time she doesn't have a cast on she's putting way too much pressure on this region. Obviously I think that's gonna be an issue as with the current national emergency concerning the Covid-19 Virus it has been recommended that we discontinue the use of total contact casting by the chief medical officer of our company, Dr. Simona Huh. The reasoning is that if a patient becomes sick and cannot come into have the cast removed they could not just leave this on for an additional two weeks. Obviously the hospitals also do not want to receive patient's who are sick into the emergency department to potentially contaminate the region and spread the Covid-19 Virus among other sick individuals within the hospital system. Therefore at this point we are suspending the use of total contact cast until the current emergency subsides. This was all discussed with the patient today as well. 01/22/19 on evaluation today patient's wound on her left great toe appears to be doing slightly worse than previously noted last week.  She tells me that she has been on this quite a bit in fact she tells me she's been awake for 38 straight hours. This is due to the fact that she's having to care for grandparents because nobody else will. She has been taking care of them for five the last seven days since I've seen her they both have dementia his is from a stroke and her grandmother's was progressive. Nonetheless she states even her mom who knows her condition and situation has only help two of those days to take care of them she's been taking care of the rest. Fortunately there does not appear to be any signs of active infection in regard to her toe at this point although obviously it doesn't look as good as it did previous. I think this is directly related to her not taking off the pressure and friction by way of taking things easy. Though I completely understand what's going on. 01/29/19 on evaluation today patient's tools are actually appears to be showing  some signs of improvement today compared to last week's evaluation as far as not necessarily the overall size of the wound but the fact that she has some new skin growth in between the two ends of the wound opening. Overall I feel like she has done well she states that she had a family member give her what sounds to be a CAM walker boot which has been helpful as well. 02/05/19 on evaluation today patient's wound bed actually appears to be doing significantly better in regard to her overall Burley, Quincy A. ([id number]) appearance of the size of the wound. With that being said she is still having an issue with offloading efficiently enough to get this to close. Apparently there is some signs of infection at this point as well unfortunately. Previously she's done well of Augmentin I really do not see anything that needs to be culture currently but there theme and cellulitis of the foot that I'm seeing I'm gonna go ahead and place her on an antibiotic today to try to help clear this  up. Electronic Signature(s) Signed: 02/05/2019 1:21:29 PM By: Worthy Keeler PA-C Entered By: Worthy Keeler on 02/05/2019 13:13:56 [name] ([id number]) -------------------------------------------------------------------------------- Physical Exam Details Patient Name: Heather Dear A. Date of Service: 02/05/2019 10:30 AM Medical Record Number: [id number] Patient Account Number: [id number] Date of Birth/Sex: [birth year] (43 y.o. F) Treating RN: Montey Hora Primary Care Provider: Priscille Kluver Other Clinician: Referring Provider: Priscille Kluver Treating Provider/Extender: STONE III, HOYT Weeks in Treatment: 41 Constitutional Well-nourished and well-hydrated in no acute distress. Respiratory normal breathing without difficulty. clear to auscultation bilaterally. Cardiovascular regular rate and rhythm with normal S1, S2. Psychiatric this patient is able to make decisions and demonstrates good insight into disease process. Alert and Oriented x 3. pleasant and cooperative. Notes Patient's wound bed currently showed signs of good granulation for the most part it was some [name] noted as well as callous that did require sharp debridement today post debridement wound bed appears to be doing much better. With that being said due to the erythema I am going to go ahead and see about prescribing a prescription for her for Augmentin in order to ensure that this clears up and does not in any way worsen. Electronic Signature(s) Signed: 02/05/2019 1:21:29 PM By: Worthy Keeler PA-C Entered By: Worthy Keeler on 02/05/2019 13:15:13 [name] ([id number]) -------------------------------------------------------------------------------- Physician Orders Details Patient Name: Heather Dear A. Date of Service: 02/05/2019 10:30 AM Medical Record Number: [id number] Patient Account Number: [id number] Date of Birth/Sex: 1976/02/02 (43 y.o. 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 Anesthetic (add to Medication List) Wound #5 Left Toe Great o Topical Lidocaine 4% cream applied to wound bed prior to debridement (In Clinic Only). Primary Wound Dressing Wound #5 Left Toe Great o Silver Alginate Secondary Dressing Wound #5 Left Toe Great o Conform/Kerlix o Foam Dressing Change Frequency Wound #5 Left Toe Great o Three times weekly Follow-up Appointments Wound #5 Left Toe Great o Return Appointment in 1 week. Off-Loading Wound #5 Left Toe Great o Open toe surgical shoe to: -  Wear your shoe with your brace Patient Medications Allergies: erythromycin base Notifications Medication Indication Start End Augmentin 02/05/2019 Heather, Kelsea A. ([id number]) Notifications Medication Indication Start End DOSE 1 - oral 875 mg-125 mg tablet - 1 tablet oral take 2 times a day for 14 days Electronic Signature(s) Signed: 02/05/2019 11:29:56 AM By: Worthy Keeler PA-C Entered By: Worthy Keeler on 02/05/2019 11:29:56 Lui, Gwendolyne A. ([id number]) -------------------------------------------------------------------------------- Problem List Details Patient Name: Heather Dear A. Date of Service: 02/05/2019 10:30 AM Medical Record Number: [id number] Patient Account Number: [id number] Date of Birth/Sex: April 10, 1976 (43 y.o. F) Treating RN: Montey Hora Primary Care Provider: Priscille Kluver Other Clinician: Referring Provider: Priscille Kluver Treating Provider/Extender: Melburn Hake, HOYT Weeks in Treatment:  26 Active Problems ICD-10 Evaluated Encounter Code Description Active Date Today Diagnosis E11.621 Type 2 diabetes mellitus with foot ulcer 08/07/2018 No Yes L97.522 Non-pressure chronic ulcer of other part of left foot with fat 08/08/2018 No Yes layer exposed M21.372 Foot drop, left foot 09/11/2018 No Yes Inactive Problems Resolved Problems ICD-10 Code Description Active Date Resolved Date L03.032 Cellulitis of left toe 08/07/2018 08/07/2018 Electronic Signature(s) Signed: 02/05/2019 1:21:29 PM By: Worthy Keeler PA-C Entered By: Worthy Keeler on 02/05/2019 10:29:55 Partch, Lauree A. ([id number]) -------------------------------------------------------------------------------- Progress Note Details Patient Name: Heather Dear A. Date of Service: 02/05/2019 10:30 AM Medical Record Number: [id number] Patient Account Number: [id number] Date of Birth/Sex: [birth year] (43 y.o. F) Treating RN: Montey Hora Primary Care Provider: Priscille Kluver Other Clinician: Referring Provider: Priscille Kluver Treating Provider/Extender: Melburn Hake, HOYT Weeks in Treatment: 26 Subjective Chief Complaint Information obtained from Patient Left great toe ulcer History of Present Illness (HPI) 01/18/18-She is here for initial evaluation of the left great toe ulcer. She is a poor historian in regards to timeframe in detail. She states approximately 4 weeks ago she lacerated her toe on something in the house. She followed up with her primary care who placed her on Bactrim and ultimately a second dose of Bactrim prior to coming to wound clinic. She states she has been treating the toe with peroxide, Betadine and a Band-Aid. She did not check her blood sugar this morning but checked it yesterday morning it was 327; she is unaware of a recent A1c and there are no current records. She saw Dr. she would've orthopedics last week for an old injury to the left ankle, she states he did not see her toe, nor did she  bring it to his attention. She smokes approximately 1 pack cigarettes a day. Her social situation is concerning, she arrives this morning with her mother who appears extremely intoxicated/under the influence; her mother was asked to leave the room and be monitored by the patient's grandmother. The patient's aunt then accompanied the patient and the room throughout the rest of the appointment. We had a lengthy discussion regarding the deleterious effects of uncontrolled hyperglycemia and smoking as it relates to wound healing and overall health. She was strongly encouraged to decrease her smoking and get her diabetes under better control. She states she is currently on a diet and has cut down her [facility] consumption. The left toe is erythematous, macerated and slightly edematous with malodor present. The edema in her left foot is below her baseline, there is no erythema streaking. We will treat her with Santyl, doxycycline; we have ordered and xray, culture and provided a Peg assist surgical shoe and cultured the wound. 01/25/18-She is here in follow-up evaluation for a left great toe ulcer and presents with  an abscess to her suprapubic area. She states her blood sugars remain elevated, feeling "sick" and if levels are below 250, but she is trying. She has made no attempt to decrease her smoking stating that we "can't take away her food in her cigarettes". She has been compliant with offloading using the PEG assist you. She is using Santyl daily. the culture obtained last week grew staph aureus and Enterococcus faecalis; continues on the doxycycline and Augmentin was added on Monday. The suprapubic area has erythema, no femoral variation, purple discoloration, minimal induration, was accessed with a cotton tip applicator with sanguinopurulent drainage, this was cultured, I suspect the current antibiotic treatment will cover and we will not add anything to her current treatment plan. She was  advised to go to urgent care or ER with any change in redness, induration or fever. 02/01/18-She is here in follow-up evaluation for left great toe ulcers and a new abdominal abscess from last week. She was able to use packing until earlier this week, where she "forgot it was there". She states she was feeling ill with GI symptoms last week and was not taking her antibiotic. She states her glucose levels have been predominantly less than 200, with occasional levels between 200-250. She thinks this was contributing to her GI symptoms as they have resolved without intervention. There continues to be significant laceration to left toe, otherwise it clinically looks stable/improved. There is now less superficial opening to the lateral aspect of the great toe that was residual blister. We will transition to [facility] to all wounds, she will continue her Augmentin. If there is no change or deterioration next week for reculture. 02/08/18-She is here in follow-up evaluation for left great toe ulcer and abdominal ulcer. There is an improvement in both wounds. She has been wrapping her left toe with coban, not by our direction, which has created an area of discoloration to the medial aspect; she has been advised to NOT use coban secondary to her neuropathy. She states her glucose levels have been high over this last week ranging from 200-350, she continues to smoke. She admits to being less compliant with her offloading shoe. We will continue with same treatment plan and she will follow-up next week. 02/15/18-She is here in follow-up evaluation for left great toe ulcer and abdominal ulcer. The abdominal ulcer is epithelialized. The left great toe ulcer is improved and all injury from last week using the Coban wrap is resolved, the lateral ulcer is healed. She admits to noncompliance with wearing offloading shoe and admits to glucose levels being greater than 300 most of the week. She continues to smoke and  expresses no desire to quit. There is one area medially that probes deeper than it has historically, erythema to the toe and dorsal foot has consistently waxed and waned. There is no overt signs of cellulitis or infection but we will culture the wound for any occult infection given the new area of depth and erythema. We will hold off on Corey, Dante A. ([id number]) sensitivities for initiation of antibiotic therapy. 02/22/18-She is here in follow up evaluation for left great toe ulcer. There is overall significant improvement in both wound appearance, erythema and edema with changes made last week. She was not initiated on antibiotic therapy. Culture obtained last week showed oxacillin sensitive staph aureus, sensitive to clindamycin. Clindamycin has been called into the pharmacy but she has been instructed to hold off on initiation secondary to overall clinical improvement and her history of antibiotic  intolerance. She has been instructed to contact the clinic with any noted changes/deterioration and the wound, erythema, edema and/or pain. She will follow-up next week. She continues to smoke and her glucose levels remain elevated >250; she admits to compliance with offloading shoe 03/01/18 on evaluation today patient appears to be doing fairly well in regard to her left first toe ulcer. She has been tolerating the dressing changes with the [facility] Dressing without complication and overall this has definitely showed signs of improvement according to records as well is what the patient tells me today. I'm very pleased in that regard. She is having no pain today 03/08/18 She is here for follow up evaluation of a left great toe ulcer. She remains non-compliant with glucose control and smoking cessation; glucose levels consistently >200. She states that she got new shoe inserts/peg assist. She admits to compliance with offloading. Since my last evaluation there is significant improvement. We will  switch to prisma at this time and she will follow up next week. She is noted to be tachycardic at this appointment, heart rate 120s; she has a history of heart rate 70-130 according to our records. She admits to extreme agitation r/t personal issues; she was advised to monitor her heartrate and contact her physician if it does not return to a more normal range (<100). She takes cardizem twice daily. 03/15/18-She is here in follow-up evaluation for left great toe ulcer. She remains noncompliant with glucose control and smoking cessation. She admits to compliance with wearing offloading shoe. The ulcer is improved/stable and we will continue with the same treatment plan and she will follow-up next week 03/22/18-She is here for evaluation for left great toe ulcer. There continues to be significant improvement despite recurrent hyperglycemia (over 500 yesterday) and she continues to smoke. She has been compliant with offloading and we will continue with same treatment plan and she will follow-up next week. 03/29/18-She is here for evaluation for left great toe ulcer. Despite continuing to smoke and uncontrolled diabetes she continues to improve. She is compliant with offloading shoe. We will continue with the same treatment plan and she will follow-up next week 04/05/18- She is here in follow up evaluation for a left great toe ulcer; she presents with small pustule to left fifth toe (resembles ant bite). She admits to compliance with wearing offloading shoe; continues to smoke or have uncontrolled blood glucose control. There is more callus than usual with evidence of bleeding; she denies known trauma. 04/12/18-She is here for evaluation of left great toe ulcer. Despite noncompliance with glycemic control and smoking she continues to make improvement. She continues to wear offloading shoe. The pustule, that was identified last week, to the left fifth toe is resolved. She will follow-up in 2  weeks 05/03/18-she is seen in follow-up evaluation for a left great toe ulcer. She is compliant with offloading, otherwise noncompliant with glycemic control and smoking. She has plateaued and there is minimal improvement noted. We will transition to [facility], replaced the insert to her surgical shoe and she will follow-up in one week 05/10/18- She is here in follow up evaluation for a left great toe ulcer. It appears stable despite measurement change. We will continue with same treatment plan and follow up next week. 05/24/18-She is seen in follow-up evaluation for a left great toe ulcer. She remains compliant with offloading, has made significant improvement in her diet, decreasing the amount of sugar/soda. She said her recent A1c was 10.9 which is lower than.  She did see a diabetic nutritionist/educator yesterday. She continues to smoke. We will continue with the same treatment plan and she'll follow-up next week. 05/31/18- She is seen in follow-up evaluation for left great toe ulcer. She continues to remain compliant with offloading, continues to make improvement in her diet, increasing her water and decreasing the amount of sugar/soda. She does continue to smoke with no desire to quit. We will apply Prisma to the depth and Hydrofera Blue over. We have not received insurance authorization for oasis. She will follow up next week. 06/07/18-She is seen in follow-up evaluation for left great toe ulcer. It has stalled according to today's measurements although base appears stable. She says she saw a diabetic educator yesterday; her average blood sugars are less than 300 which is an improvement for her. She continues to smoke and states "that's my next step" She continues with water over soda. We will order for xray, culture and reinstate ace wrap compression prior to placing apligraf for next week. She is voicing no complaints or concerns. Her dressing will change to iodoflex over the next week in  preparation for apligraf. 06/14/18-She is seen in follow-up evaluation for left great toe ulcer. Plain film x-ray performed last week was negative for osteomyelitis. Wound culture obtained last week grew strep B and OSSA; she is initiated on keflex and cefdinir today; there is erythema to the toe which could be from ace wrap compression, she has a history of wrapping too tight and has has been encouraged to maintain ace wraps that we place today. We will hold off on application of apligraf today, will apply next week after antibiotic therapy has been initiated. She admits today that she has resumed taking a shower with her foot/toe submerged in water, she has been reminded to keep foot/toe out of the bath water. She will be seen in follow up next week 06/21/18-she is seen in follow-up evaluation for left great toe ulcer. She is tolerating antibiotic therapy with no GI disturbance. The wound is stable. Apligraf was applied today. She has been decreasing her smoking, only had 4 cigarettes yesterday and Mccadden, Hser A. ([id number]) 1 today. She continues being more compliant in diabetic diet. She will follow-up next week for evaluation of site, if stable will remove at 2 weeks. 06/28/18- She is here in follow up evalution. Apligraf was placed last week, she states the dressing fell off on Tuesday and she was dressing with hydrofera blue. She is healed and will be discharged from the clinic today. She has been instructed to continue with smoking cessation, continue monitoring glucose levels, offloading for an additional 4 weeks and continue with hydrofera blue for additional two weeks for any possible microscopic opening. Readmission: 08/07/18 on evaluation today patient presents for reevaluation concerning the ulcer of her right great toe. She was previously discharged on 06/28/18 healed. Nonetheless she states that this began to show signs of drainage she subsequently went to her primary care provider.  Subsequently an x-ray was performed on 08/01/18 which was negative. The patient was also placed on antibiotics at that time. Fortunately they should have been effective for the infection. Nonetheless she's been experiencing some improvement but still has a lot of drainage coming from the wound itself. 08/14/18 on evaluation today patient's wound actually does show signs of improvement in regard to the erythema at this point. She has completed the antibiotics. With that being said we did discuss the possibility of placing her in a total contact cast as of  today although I think that I may want to give this just a little bit more time to ensure nothing recurrence as far as her infection is concerned. I do not want to put in the cast and risk infection at that time if things are not completely resolved. With that being said she is gonna require some debridement today. 08/21/18 on evaluation today patient actually appears to be doing okay in regard to her toe ulcer. She's been tolerating the dressing changes without complication. With that being said it does appear that she is ready and in fact I think it's appropriate for [place] to go ahead and initiate the total contact cast today. Nonetheless she will require some sharp debridement to prepare the wound for application. Overall I feel like things have been progressing well but we do need to do something to get this to close more readily. 08/24/18 patient seen today for reevaluation after having had the total contact cast applied on Tuesday. She seems to have done very well the wound appears to be doing great and overall I'm pleased with the progress that she's made. There were no abnormal areas of rubbing from the cast on her lower extremity. 08/30/18 on evaluation today patient actually appears to be completely healed in regard to her plantar toe ulcer. She tells me at this point she's been having a lot of issues with the cast. She almost fell a couple of  times the state shall the step of her dog a couple times as well. This is been a very frustrating process for her other nonetheless she has completely healed the wound which is excellent news. Overall there does not appear to be the evidence of infection at this time which is great news. 09/11/18 evaluation today patient presents for follow-up concerning her great toe ulcer on the left which has unfortunately reopened since I last saw her which was only a couple of weeks ago. Unfortunately she was not able to get in to get the shoe and potentially the AFO that's gonna be necessary due to her left foot drop. She continues with offloading shoe but this is not enough to prevent her from reopening it appears. When we last had her in the total contact cast she did well from a healing standpoint but unfortunately the wound reopened as soon as she came out of the cast within just a couple of weeks. 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 This is also part of the reason why she falls on a regular basis. I just do not believe that is gonna be safe for [place] to reinitiate the total contact cast as last time we had this on she fell 3 times one week which is definitely not normal for her. 09/18/18 upon evaluation today the patient actually appears to be doing about the same in regard to her toe ulcer. She did not contact [facility] as I asked her to even though I had given her the prescription. In fact she actually states that she has no idea where the prescription is. She did apparently call [facility] and they told her that all she needed to do was bring the prescription in order to be able to be seen and work on getting the AFO for her left foot. With all  that being said she still does not have an appointment and I'm  not sure were things stand that regard. I will give her a new prescription today in order to contact them to get this set up. 09/25/18 on evaluation today patient actually appears to be doing about the same in regard to her toes ulcer. She does have a small areas which seems to have a lot of callous buildup around the edge of the wound which is going to need sharp debridement today. She still is waiting to be scheduled for evaluation with [facility] for possibility of an AFO. She states there supposed to call her tomorrow to get this set up. Unfortunately it does appear that her foot specifically the toe area is showing signs of erythema. There does not appear to be any systemic infection which is in these good news. MAURINA, FAWAZ A. ([id number]) 10/02/18 on evaluation today patient actually appears to be doing about the same in regard to her toe ulcer. This really has not done too well although it's not significantly larger it's also not significantly smaller. She has been tolerating the dressing changes without complication. She actually has her appointment with [facility] and [facility] tomorrow to hopefully be measured for obtaining and AFO splint. I think this would be helpful preventing this from reoccurring. We had contemplated starting the cast this week although to be honest I am reluctant to do that as she's been having nausea, vomiting, and seizure activity over the past three days. She has a history of seizures and have been told is nothing that can be done for these. With that being said I do believe that along with the seizures have the nausea vomiting which upon further questioning doesn't seem to be the normal for her and makes me concerned for the possibility of infection or something else going on. I discussed this with the patient and her mother during the office visit today. I do not feel the wound is effective but maybe something else. The responses this was "this just happens to her at  times and we don't know why". They did not seem to be interested in going to the hospital to have this checked out further. 10/09/18 on evaluation today patient presents for follow-up concerning her ongoing toe ulcer. She has been tolerating the dressing changes without complication. Fortunately there does not appear to be any evidence of infection which is great news however I do think that the patient would benefit from going ahead for with the total contact cast. She's actually in a wheelchair today she tells me that she will use her walker if we initiate the cast. I was very specific about the fact that if we were gonna do the cast I wanted to make sure that she was using the walker in order to prevent any falls. She tells me she does not have stairs that she has to traverse on a regular basis at her home. She has not had any seizures since last week again that something that happens to her often she tells me she did talk to [employer] and they said that it may take up to three weeks to get the brace approved for her. Hopefully that will not take that long but nonetheless in the meantime I do think the cast could be of benefit. 10/12/18 on evaluation today patient appears to be doing rather well in regard to her toe ulcer. It's just been a few days and already this is significantly improved both as far  as overall appearance and size. Fortunately there's no sign of infection. She is here for her first obligatory cast change. 10/19/18 Seen today for follow up and management of left great toe ulcer. Wound continues to show improvement. Noted small open area with seroussang drainage with palpation. Denies any increased pain or recent fevers during visit. She will continue calcium alginate with offloading shoe. Denies any questions or concerns during visit. 10/26/18 on evaluation today patient appears to be doing about the same as when I last saw her in regard to her wound bed. Fortunately there does not  appear to be any signs of infection. Unfortunately she continues to have a breakdown in regard to the toe region any time that she is not in the cast. It takes almost no time at all for this to happen. Nonetheless she still has not heard anything from the brace being made by [employer] as to when exactly this will be available to her. Fortunately there is no signs of infection at this time. 10/30/18 on evaluation today patient presents for application of the total contact cast as we just received him this morning. Fortunately we are gonna be able to apply this to her today which is great news. She continues to have no significant pain which is good news. Overall I do feel like things have been improving while she was the cast is when she doesn't have a cast that things get worse. She still has not really heard anything from [facility] regarding her brace. 11/02/18 upon evaluation today patient's wound already appears to be doing significantly better which is good news. Fortunately there does not appear to be any signs of infection also good news. Overall I do think the total contact cast as before is helping to heal this area unfortunately it's just not gonna likely keep the area closed and healed without her getting her brace at least. Again the foot drop is a significant issue for her. 11/09/18 on evaluation today patient appears to be doing excellent in regard to her toe ulcer which in fact is completely healed. Fortunately we finally got the situation squared away with the paperwork which was needed to proceed with getting her brace approved by Medicaid. I have filled that out unfortunately that information has been sent to the orthopedic office that I worked at 2 1/2 years ago and not tired Current wound care measures. Fortunately she seems to be doing very well at this time. 11/23/18 on evaluation today patient appears to be doing More Poorly Compared to Last Time I Saw Her. At [facility] She Had  Completely Healed. Currently she is continuing to have issues with reopening. She states that she just found out that the brace was approved through Medicaid now she just has to go get measured in order to have this fitted for her and then made. Subsequently she does not have an appointment for this yet that is going to complicate things we obviously cannot put her back in the cast if we do not have everything measured because they're not gonna be able to measure her foot while she is in the cast. 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 Obviously this is unfortunate and does have me somewhat worried as well. 11/30/18 on evaluation today patient's toe ulcer actually appears to be doing fairly well. The good news is she will be getting her Gilberto, Phyillis  A. ([id number]) brace in the shoes next week on Wednesday. Hopefully we will be able to get this to heal without having to go back in the cast however she may need the cast in order to get the wound completely heal and then go from there. Fortunately there's no signs of infection at this time. 12/07/18 on evaluation today patient fortunately did receive her brace and she states she could tell this definitely makes her walk better. With that being said she's been having issues with her toe where she noticed yesterday there was a lot of tissue that was loosing off this appears to be much larger than what it was previous. She also states that her leg has been read putting much across the top of her foot just about the ankle although this seems to be receiving somewhat. The total area is still red and appears to be someone infected as best I can tell. She is previously taken Bactrim and that may be a good option for her today as well. We are gonna see what I wound culture shows as well and I think that this is definitely appropriate. With that being said  outside of the culture I still need to initiate something in the interim and that's what I'm gonna go ahead and select Bactrim is a good option for her. 12/14/18 on evaluation today patient appears to be doing better in regard to her left great toe ulcer as compared to last week's evaluation. There's still some erythema although this is significantly improved which is excellent news. Overall I do believe that she is making good progress is still gonna take some time before she is where I would like her to be from the standpoint of being able to place her back into the total contact cast. Hopefully we will be where we need to be by next week. 12/21/18 on evaluation today patient actually appears to be doing poorly in regard to her toe ulcer. She's been tolerating the dressing changes without complication. Fortunately there's no signs of systemic infection although she does have a lot of drainage from the toe ulcer and this does seem to be causing some issues at this point. She does have erythema on the distal portion of her toe that appears to be likely cellulitis. 12/28/18 on evaluation today patient actually appears to be doing a little better in my pinion in regard to her toe ulcer. With that being said she still does have some evidence of infection at this time and for her culture she had both E. coli as well as enterococcus as organisms noted on evaluation. For that reason I think that though the Keflex likely has treated the E. coli rather well this has really done nothing for the enterococcus. We are going to have to initiate treatment for this specifically. 01/04/19 on evaluation today patient's toe actually appears to be doing better from the standpoint of infection. She currently would like to see about putting the cash back on I think that this is appropriate as long as she takes care of it and keeps it from getting wet. She is gonna have some drainage we can definitely pass this up with Drawtex  and alginate to try to prevent as much drainage as possible from causing the problems. With that being said I do want to at least try her with the cast between now and Tuesday. If there any issues we can't continue to use it then I will discontinue the use of the cast  at that point. 01/08/19 on evaluation today patient actually appears to be doing very well as far as her foot ulcer specifically the great toe on the left is concerned. She did have an area of rubbing on the medial aspect of her left ankle which again is from the cast. Fortunately there's no signs of infection at this point in this appears to be a very slight skin breakdown. The patient tells me she felt it rubbing but didn't think it was that bad. Fortunately there is no signs of active infection at this time which is good news. No fevers, chills, nausea, or vomiting noted at this time. 01/15/19 on evaluation today patient actually appears to be doing well in regard to her toe ulcer. Again as previous she seems to do well and she has the cast on which indicates to me that during the time she doesn't have a cast on she's putting way too much pressure on this region. Obviously I think that's gonna be an issue as with the current national emergency concerning the Covid-19 Virus it has been recommended that we discontinue the use of total contact casting by the chief medical officer of our company, Dr. Simona Huh. The reasoning is that if a patient becomes sick and cannot come into have the cast removed they could not just leave this on for an additional two weeks. Obviously the hospitals also do not want to receive patient's who are sick into the emergency department to potentially contaminate the region and spread the Covid-19 Virus among other sick individuals within the hospital system. Therefore at this point we are suspending the use of total contact cast until the current emergency subsides. This was all discussed with the patient today  as well. 01/22/19 on evaluation today patient's wound on her left great toe appears to be doing slightly worse than previously noted last week. She tells me that she has been on this quite a bit in fact she tells me she's been awake for 38 straight hours. This is due to the fact that she's having to care for grandparents because nobody else will. She has been taking care of them for five the last seven days since I've seen her they both have dementia his is from a stroke and her grandmother's was progressive. Nonetheless she states even her mom who knows her condition and situation has only help two of those days to take care of them she's been taking care of the rest. Fortunately there does not appear to be any signs of active infection in regard to her toe at this point although obviously it doesn't look as good as it did previous. I think this is directly related to her not taking off the pressure and friction by way of taking things easy. Though I completely understand what's going on. LILLYAUNA, JENKINSON ([id number]) 01/29/19 on evaluation today patient's tools are actually appears to be showing some signs of improvement today compared to last week's evaluation as far as not necessarily the overall size of the wound but the fact that she has some new skin growth in between the two ends of the wound opening. Overall I feel like she has done well she states that she had a family member give her what sounds to be a CAM walker boot which has been helpful as well. 02/05/19 on evaluation today patient's wound bed actually appears to be doing significantly better in regard to her overall appearance of the size of the wound. With that being said  she is still having an issue with offloading efficiently enough to get this to close. Apparently there is some signs of infection at this point as well unfortunately. Previously she's done well of Augmentin I really do not see anything that needs to be culture  currently but there theme and cellulitis of the foot that I'm seeing I'm gonna go ahead and place her on an antibiotic today to try to help clear this up. Patient History Information obtained from Patient. Social History Current every day smoker. Medical History Ear/Nose/Mouth/Throat Patient has history of Chronic sinus problems/congestion, Middle ear problems Endocrine Patient has history of Type II Diabetes Review of Systems (ROS) Constitutional Symptoms (General Health) Denies complaints or symptoms of Fatigue, Fever, Chills, Marked Weight Change. Respiratory Denies complaints or symptoms of Chronic or frequent coughs, Shortness of Breath. Cardiovascular Denies complaints or symptoms of Chest pain, LE edema. Psychiatric Denies complaints or symptoms of Anxiety, Claustrophobia. Objective Constitutional Well-nourished and well-hydrated in no acute distress. Vitals Time Taken: 11:03 AM, Height: 69 in, Weight: 240 lbs, BMI: 35.4, Temperature: 98.5 F, Pulse: 86 bpm, Respiratory Rate: 16 breaths/min, Blood Pressure: 94/55 mmHg. Respiratory normal breathing without difficulty. clear to auscultation bilaterally. Cardiovascular regular rate and rhythm with normal S1, S2. Psychiatric this patient is able to make decisions and demonstrates good insight into disease process. Alert and Oriented x 3. pleasant Quintero, Naysha A. ([id number]) and cooperative. General Notes: Patient's wound bed currently showed signs of good granulation for the most part it was some Slough noted as well as callous that did require sharp debridement today post debridement wound bed appears to be doing much better. With that being said due to the erythema I am going to go ahead and see about prescribing a prescription for her for Augmentin in order to ensure that this clears up and does not in any way worsen. Integumentary (Hair, Skin) Wound #5 status is Open. Original cause of wound was Gradually Appeared. The  wound is located on the Left Toe Great. The wound measures 2.2cm length x 0.6cm width x 0.2cm depth; 1.037cm^2 area and 0.207cm^3 volume. There is Fat Layer (Subcutaneous Tissue) Exposed exposed. There is no tunneling or undermining noted. There is a medium amount of serous drainage noted. The wound margin is epibole. There is large (67-100%) pink, hyper - granulation within the wound bed. There is a small (1-33%) amount of necrotic tissue within the wound bed including Adherent Slough. The periwound skin appearance exhibited: Callus, Maceration, Erythema. The periwound skin appearance did not exhibit: Crepitus, Excoriation, Induration, Rash, Scarring, Dry/Scaly, Atrophie Blanche, Cyanosis, Ecchymosis, Hemosiderin Staining, Mottled, Pallor, Rubor. The surrounding wound skin color is noted with erythema which is circumferential. Periwound temperature was noted as No Abnormality. 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 There was a Excisional Skin/Subcutaneous Tissue Debridement with a total area of 1.32 sq cm performed by STONE III, HOYT E., PA-C. With the following instrument(s): Curette to remove Viable and Non-Viable tissue/material. Material removed includes Callus, Subcutaneous Tissue, and Slough after achieving pain control using Lidocaine 4% Topical Solution. No specimens were taken. A time out was conducted at 11:24, prior to the start of the procedure. A Minimum amount of bleeding was controlled  with Pressure. The procedure was tolerated well with a pain level of 0 throughout and a pain level of 0 following the procedure. Post Debridement Measurements: 2.2cm length  x 0.6cm width x 0.3cm depth; 0.311cm^3 volume. Character of Wound/Ulcer Post Debridement is improved. Severity of Tissue Post Debridement is: Fat layer exposed. Post procedure Diagnosis Wound #5: Same as Pre-Procedure Plan Wound Cleansing: Gutierres, Dalina A. ([id number]) Wound #5 Left Toe Great: Clean wound with Normal Saline. Anesthetic (add to Medication List): Wound #5 Left Toe Great: Topical Lidocaine 4% cream applied to wound bed prior to debridement (In Clinic Only). Primary Wound Dressing: Wound #5 Left Toe Great: Silver Alginate Secondary Dressing: Wound #5 Left Toe Great: Conform/Kerlix Foam Dressing Change Frequency: Wound #5 Left Toe Great: Three times weekly Follow-up Appointments: Wound #5 Left Toe Great: Return Appointment in 1 week. Off-Loading: Wound #5 Left Toe Great: Open toe surgical shoe to: - Wear your shoe with your brace The following medication(s) was prescribed: Augmentin oral 875 mg-125 mg tablet 1 1 tablet oral take 2 times a day for 14 days starting 02/05/2019 Augmentin was prescribed for the patient today we will subsequently see were things stand at follow-up in one weeks time. In the next 1-2 weeks if the Covid-19 Virus cases level off here in our area were not seeing any evidence of spiking activity I may consider going ahead and putting her back in the total contact cast to try to get this to heal. She's in agreement that plan to come down to it. Otherwise will see were things stand at follow-up. Please see above for specific wound care orders. We will see patient for re-evaluation in 1 week(s) here in the clinic. If anything worsens or changes patient will contact our office for additional recommendations. Electronic Signature(s) Signed: 02/05/2019 1:21:29 PM By: Worthy Keeler PA-C Entered By: Worthy Keeler on 02/05/2019 13:15:46 [name]  ([id number]) -------------------------------------------------------------------------------- ROS/PFSH Details Patient Name: Heather Dear A. Date of Service: 02/05/2019 10:30 AM Medical Record Number: [id number] Patient Account Number: [id number] Date of Birth/Sex: [birth year] (43 y.o. F) Treating RN: Montey Hora Primary Care Provider: Priscille Kluver Other Clinician: Referring Provider: Priscille Kluver Treating Provider/Extender: STONE III, HOYT Weeks in Treatment: 26 Information Obtained From Patient Constitutional Symptoms (General Health) Complaints and Symptoms: Negative for: Fatigue; Fever; Chills; Marked Weight Change Respiratory Complaints and Symptoms: Negative for: Chronic or frequent coughs; Shortness of Breath Cardiovascular Complaints and Symptoms: Negative for: Chest pain; LE edema Psychiatric Complaints and Symptoms: Negative for: Anxiety; Claustrophobia Ear/Nose/Mouth/Throat Medical History: Positive for: Chronic sinus problems/congestion; Middle ear problems Endocrine Medical History: Positive for: Type II Diabetes Time with diabetes: 10 years Treated with: Insulin, Oral agents Blood sugar tested every day: No Blood sugar testing results: Lunch: 350 HBO Extended History Items Ear/Nose/Mouth/Throat: Ear/Nose/Mouth/Throat: Chronic sinus problems/congestion Middle ear problems Immunizations Pneumococcal Vaccine: Received Pneumococcal Vaccination: No Implantable Devices No devices added Mcduffee, Aliah A. ([id number]) Family and Social History Current every day smoker Physician Affirmation I have reviewed and agree with the above information. Electronic Signature(s) Signed: 02/05/2019 1:21:29 PM By: Worthy Keeler PA-C Signed: 02/06/2019 1:17:32 PM By: Montey Hora Entered By: Worthy Keeler on 02/05/2019 13:14:39 Preis, Shonta AMarland Kitchen ([id number]) -------------------------------------------------------------------------------- [place] Details Patient  Name: Heather Dear A. Date of Service: 02/05/2019 Medical Record Number: [id number] Patient Account Number: [id number] Date of Birth/Sex: [birth year] (43 y.o. 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

## 2019-02-12 ENCOUNTER — Ambulatory Visit
Admission: RE | Admit: 2019-02-12 | Discharge: 2019-02-12 | Disposition: A | Discharge status: Home / Self Care | Payer: Medicaid Other | Source: Ambulatory Visit | Attending: Physician Assistant | Admitting: Physician Assistant

## 2019-02-12 ENCOUNTER — Encounter: Payer: Medicaid Other | Admitting: Physician Assistant

## 2019-02-12 ENCOUNTER — Other Ambulatory Visit: Payer: Self-pay | Admitting: Physician Assistant

## 2019-02-12 ENCOUNTER — Other Ambulatory Visit: Payer: Self-pay

## 2019-02-12 DIAGNOSIS — M869 Osteomyelitis, unspecified: Secondary | ICD-10-CM | POA: Insufficient documentation

## 2019-02-12 DIAGNOSIS — E11622 Type 2 diabetes mellitus with other skin ulcer: Secondary | ICD-10-CM | POA: Diagnosis not present

## 2019-02-13 ENCOUNTER — Other Ambulatory Visit
Admission: RE | Admit: 2019-02-13 | Discharge: 2019-02-13 | Disposition: A | Discharge status: Home / Self Care | Payer: Medicaid Other | Source: Ambulatory Visit | Attending: Physician Assistant | Admitting: Physician Assistant

## 2019-02-13 DIAGNOSIS — L089 Local infection of the skin and subcutaneous tissue, unspecified: Secondary | ICD-10-CM | POA: Diagnosis present

## 2019-02-15 LAB — AEROBIC CULTURE W GRAM STAIN (SUPERFICIAL SPECIMEN)

## 2019-02-15 NOTE — Progress Notes (Signed)
TAKEYA, MARQUIS ([id number]) Visit Report for 02/12/2019 Arrival Information Details Patient Name: ANERI, SLAGEL A. Date of Service: 02/12/2019 9:15 AM Medical Record Number: [id number] Patient Account Number: [id number] Date of Birth/Sex: 10-26-1975 (43 y.o. F) Treating RN: Cornell Barman Primary Care Ricky Doan: Priscille Kluver Other Clinician: Referring Camauri Craton: Priscille Kluver Treating Shahid Flori/Extender: Melburn Hake, HOYT Weeks in Treatment: 27 Visit Information History Since Last Visit Added or deleted any medications: No Patient Arrived: Wheel Chair Any new allergies or adverse reactions: No Arrival Time: 09:19 Had a fall or experienced change in No activities of daily living that may affect Accompanied By: self risk of falls: Transfer Assistance: None Signs or symptoms of abuse/neglect since last visito No Patient Identification Verified: Yes Hospitalized since last visit: No Secondary Verification Process Completed: Yes Implantable device outside of the clinic excluding No Patient Requires Transmission-Based No cellular tissue based products placed in the center Precautions: since last visit: Patient Has Alerts: No Pain Present Now: No Electronic Signature(s) Signed: 02/12/2019 3:23:54 PM By: Gretta Cool, BSN, RN, CWS, [name] RN, BSN Entered By: Gretta Cool, BSN, RN, CWS, [name] on 02/12/2019 09:19:54 [name] ([id number]) -------------------------------------------------------------------------------- Encounter Discharge Information Details Patient Name: [name] Date of Service: 02/12/2019 9:15 AM Medical Record Number: [id number] Patient Account Number: [id number] Date of Birth/Sex: 18-Apr-1976 (43 y.o. F) Treating RN: Cornell Barman Primary Care Caitlyne Ingham: Priscille Kluver Other Clinician: Referring [name] [name]: Priscille Kluver Treating Sidi Dzikowski/Extender: Melburn Hake, HOYT Weeks in Treatment: 67 Encounter Discharge Information Items Post Procedure Vitals Discharge Condition: Stable Temperature  (F): 97.7 Ambulatory Status: Wheelchair Pulse (bpm): 92 Discharge Destination: Home Respiratory Rate (breaths/min): 16 Transportation: Private Auto Blood Pressure (mmHg): 81/67 Accompanied By: self Schedule Follow-up Appointment: Yes Clinical Summary of Care: Electronic Signature(s) Signed: 02/12/2019 3:23:54 PM By: Gretta Cool, BSN, RN, CWS, [name] RN, BSN Entered By: Gretta Cool, BSN, RN, CWS, [name] on 02/12/2019 09:53:07 [name] ([id number]) -------------------------------------------------------------------------------- Lower Extremity Assessment Details Patient Name: [name] Date of Service: 02/12/2019 9:15 AM Medical Record Number: [id number] Patient Account Number: [id number] Date of Birth/Sex: November 06, 1975 (43 y.o. F) Treating RN: Cornell Barman Primary Care Emaleigh Guimond: Priscille Kluver Other Clinician: Referring Erol Flanagin: Priscille Kluver Treating Ronny Ruddell/Extender: Melburn Hake, HOYT Weeks in Treatment: 27 Vascular Assessment Pulses: Dorsalis Pedis Palpable: [Left:Yes] Electronic Signature(s) Signed: 02/12/2019 3:23:54 PM By: Gretta Cool, BSN, RN, CWS, [name] RN, BSN Entered By: Gretta Cool, BSN, RN, CWS, [name] on 02/12/2019 09:31:50 [name] ([id number]) -------------------------------------------------------------------------------- Multi Wound Chart Details Patient Name: [name] Date of Service: 02/12/2019 9:15 AM Medical Record Number: [id number] Patient Account Number: [id number] Date of Birth/Sex: 04/30/1976 (43 y.o. F) Treating RN: Cornell Barman Primary Care Michella Detjen: Priscille Kluver Other Clinician: Referring Mackenzie Lia: Priscille Kluver Treating Ferrel Simington/Extender: Melburn Hake, HOYT Weeks in Treatment: 27 Vital Signs Height(in): 69 Pulse(bpm): 92 Weight(lbs): 240 Blood Pressure(mmHg): 81/67 Body Mass Index(BMI): 35 Temperature(F): 97.7 Respiratory Rate 16 (breaths/min): Photos: [5:No Photos] [N/A:N/A] Wound Location: [5:Left Toe Great] [N/A:N/A] Wounding Event: [5:Gradually  Appeared] [N/A:N/A] Primary Etiology: [5:Diabetic Wound/Ulcer of the N/A Lower Extremity] Comorbid History: [5:Chronic sinus problems/congestion, Middle ear problems, Type II Diabetes] [N/A:N/A] Date Acquired: [5:11/16/2018] [N/A:N/A] Weeks of Treatment: [5:11] [N/A:N/A] Wound Status: [5:Open] [N/A:N/A] Measurements L x W x D [5:3x2.2x0.2] [N/A:N/A] (cm) Area (cm) : [5:5.184] [N/A:N/A] Volume (cm) : [5:1.037] [N/A:N/A] % Reduction in Area: [5:-1219.10%] [N/A:N/A] % Reduction in Volume: [5:-560.50%] [N/A:N/A] Classification: [5:Grade 3] [N/A:N/A] Exudate Amount: [5:Medium] [N/A:N/A] Exudate Type: [5:Serous] [N/A:N/A] Exudate Color: [5:amber] [N/A:N/A] Wound Margin: [5:Epibole] [N/A:N/A] Granulation Amount: [5:Small (1-33%)] [N/A:N/A] Granulation Quality: [5:Pink, Hyper-granulation] [N/A:N/A] Necrotic  Amount: [5:Large (67-100%)] [N/A:N/A] Exposed Structures: [5:Fat Layer (Subcutaneous Tissue) Exposed: Yes Fascia: No Tendon: No Muscle: No Joint: No Bone: No] [N/A:N/A] Epithelialization: [5:None] [N/A:N/A] Periwound Skin Texture: [5:Callus: Yes Excoriation: No Induration: No Crepitus: No] [N/A:N/A] Rash: No Scarring: No Periwound Skin Moisture: Maceration: Yes N/A N/A Dry/Scaly: No Periwound Skin Color: Erythema: Yes N/A N/A Rubor: Yes Atrophie Blanche: No Cyanosis: No Ecchymosis: No Hemosiderin Staining: No Mottled: No Pallor: No Erythema Location: Circumferential N/A N/A Temperature: Hot N/A N/A Tenderness on Palpation: No N/A N/A Assessment Notes: Patient arrives today wearing N/A N/A tennis shoes. Patient is fidgeting and seems disinterested in what is going on around her. Treatment Notes Electronic Signature(s) Signed: 02/12/2019 3:23:54 PM By: Gretta Cool, BSN, RN, CWS, [name] RN, BSN Entered By: Gretta Cool, BSN, RN, CWS, [name] on 02/12/2019 09:32:04 [name]  ([id number]) -------------------------------------------------------------------------------- [place] Details Patient Name: [name] Date of Service: 02/12/2019 9:15 AM Medical Record Number: [id number] Patient Account Number: [id number] Date of Birth/Sex: 10-20-1976 (43 y.o. F) Treating RN: Cornell Barman Primary Care Dorita Rowlands: Priscille Kluver Other Clinician: Referring Rodrecus Belsky: Priscille Kluver Treating Cheyrl Buley/Extender: Melburn Hake, HOYT Weeks in Treatment: 77 Active Inactive Abuse / Safety / Falls / Self Care Management Nursing Diagnoses: History of Falls Goals: Patient will not experience any injury related to falls Date Initiated: 08/07/2018 Target Resolution Date: 10/19/2018 Goal Status: Active Interventions: Assess fall risk on admission and as needed Notes: Orientation to the Wound Care Program Nursing Diagnoses: Knowledge deficit related to the wound healing center program Goals: Patient/caregiver will verbalize understanding of the [place] Program Date Initiated: 08/07/2018 Target Resolution Date: 10/19/2018 Goal Status: Active Interventions: Provide education on orientation to the wound center Notes: Soft Tissue Infection Nursing Diagnoses: Impaired tissue integrity Goals: Patient will remain free of wound infection Date Initiated: 08/07/2018 Target Resolution Date: 10/19/2018 Goal Status: Active Interventions: Assess signs and symptoms of infection every visit Kreher, Mckynleigh A. ([id number]) Notes: Wound/Skin Impairment Nursing Diagnoses: Impaired tissue integrity Goals: Ulcer/skin breakdown will heal within 14 weeks Date Initiated: 08/07/2018 Target Resolution Date: 10/19/2018 Goal Status: Active Interventions: Assess patient/caregiver ability to obtain necessary supplies Notes: Electronic Signature(s) Signed: 02/12/2019 3:23:54 PM By: Gretta Cool, BSN, RN, CWS, [name] RN, BSN Entered By: Gretta Cool, BSN, RN, CWS, [name] on 02/12/2019  09:31:56 [name] ([id number]) -------------------------------------------------------------------------------- Pain Assessment Details Patient Name: [name] Date of Service: 02/12/2019 9:15 AM Medical Record Number: [id number] Patient Account Number: [id number] Date of Birth/Sex: 08/29/1976 (43 y.o. F) Treating RN: Cornell Barman Primary Care Ashlye Oviedo: Priscille Kluver Other Clinician: Referring Nikaya Nasby: Priscille Kluver Treating Chrisoula Zegarra/Extender: Melburn Hake, HOYT Weeks in Treatment: 27 Active Problems Location of Pain Severity and Description of Pain Patient Has Paino No Site Locations Pain Management and Medication Current Pain Management: Notes Patient denies pain at this time. Electronic Signature(s) Signed: 02/12/2019 3:23:54 PM By: Gretta Cool, BSN, RN, CWS, [name] RN, BSN Entered By: Gretta Cool, BSN, RN, CWS, [name] on 02/12/2019 09:20:30 [name] ([id number]) -------------------------------------------------------------------------------- Patient/Caregiver Education Details Patient Name: [name] Date of Service: 02/12/2019 9:15 AM Medical Record Number: [id number] Patient Account Number: [id number] Date of Birth/Gender: 09/25/1976 (43 y.o. F) Treating RN: Cornell Barman Primary Care Physician: Priscille Kluver Other Clinician: Referring Physician: Priscille Kluver Treating Physician/Extender: Sharalyn Ink in Treatment: 39 Education Assessment Education Provided To: Patient Education Topics Provided Offloading: Handouts: What is Offloadingo, Other: wear off loading show daily Methods: Demonstration, Explain/Verbal Responses: State content correctly Wound/Skin Impairment: Handouts: Caring for Your Ulcer, Other: culture sent Methods: Demonstration,  Explain/Verbal Responses: State content correctly Electronic Signature(s) Signed: 02/12/2019 3:23:54 PM By: Gretta Cool, BSN, RN, CWS, [name] RN, BSN Entered By: Gretta Cool, BSN, RN, CWS, [name] on 02/12/2019 09:51:19 [name] ([id number]) -------------------------------------------------------------------------------- Wound Assessment Details Patient Name: [name] Date of Service: 02/12/2019 9:15 AM Medical Record Number: [id number] Patient Account Number: [id number] Date of Birth/Sex: [birth year] (43 y.o. F) Treating RN: Cornell Barman Primary Care Duong Haydel: Priscille Kluver Other Clinician: Referring Yanelie Abraha: Priscille Kluver Treating Idamae Coccia/Extender: STONE III, HOYT Weeks in Treatment: 27 Wound Status Wound Number: 5 Primary Diabetic Wound/Ulcer of the Lower Extremity Etiology: Wound Location: Left Toe Great Wound Open Wounding Event: Gradually Appeared Status: Date Acquired: 11/16/2018 Comorbid Chronic sinus problems/congestion, Middle ear Weeks Of Treatment: 11 History: problems, Type II Diabetes Clustered Wound: No Photos Wound Measurements Length: (cm) 3 % Reduction Width: (cm) 2.2 % Reduction Depth: (cm) 0.2 Epithelializ Area: (cm) 5.184 Tunneling: Volume: (cm) 1.037 Undermining in Area: -1219.1% in Volume: -560.5% ation: None No : No Wound Description Classification: Grade 3 Foul Odor A Wound Margin: Epibole Slough/Fibr Exudate Amount: Medium Exudate Type: Serous Exudate Color: amber fter Cleansing: No ino No Wound Bed Granulation Amount: Small (1-33%) Exposed Structure Granulation Quality: Pink, Hyper-granulation Fascia Exposed: No Necrotic Amount: Large (67-100%) Fat Layer (Subcutaneous Tissue) Exposed: Yes Necrotic Quality: Adherent Slough Tendon Exposed: No Muscle Exposed: No Joint Exposed: No Bone Exposed: Yes Periwound Skin Texture Texture Color Barren, Hayzlee A. ([id number]) No Abnormalities Noted: No No Abnormalities Noted: No Callus: Yes Atrophie Blanche: No Crepitus: No Cyanosis: No Excoriation: No Ecchymosis: No Induration: No Erythema: Yes Rash: No Erythema Location: Circumferential Scarring: No Hemosiderin Staining: No Mottled:  No Moisture Pallor: No No Abnormalities Noted: No Rubor: Yes Dry / Scaly: No Maceration: Yes Temperature / Pain Temperature: Hot Assessment Notes Patient arrives today wearing tennis shoes. Patient is fidgeting and seems disinterested in what is going on around her. Treatment Notes Wound #5 (Left Toe Great) Notes silver alginate, foam, conform, medipore tape. Electronic Signature(s) Signed: 02/12/2019 3:23:54 PM By: Gretta Cool, BSN, RN, CWS, [name] RN, BSN Entered By: Gretta Cool, BSN, RN, CWS, [name] on 02/12/2019 09:47:41 [name] ([id number]) -------------------------------------------------------------------------------- [place] Details Patient Name: [name] Date of Service: 02/12/2019 9:15 AM Medical Record Number: [id number] Patient Account Number: [id number] Date of Birth/Sex: 1976-04-04 (43 y.o. F) Treating RN: Cornell Barman Primary Care Ayari Liwanag: Priscille Kluver Other Clinician: Referring Shonika Kolasinski: Priscille Kluver Treating Devontay Celaya/Extender: Melburn Hake, HOYT Weeks in Treatment: 27 Vital Signs Time Taken: 09:20 Temperature (F): 97.7 Height (in): 69 Pulse (bpm): 92 Weight (lbs): 240 Respiratory Rate (breaths/min): 16 Body Mass Index (BMI): 35.4 Blood Pressure (mmHg): 81/67 Reference Range: 80 - 120 mg / dl Notes Patient's BP is her normal. Electronic Signature(s) Signed: 02/12/2019 3:23:54 PM By: Gretta Cool, BSN, RN, CWS, [name] RN, BSN Entered By: Gretta Cool, BSN, RN, CWS, [name] on 02/12/2019 09:24:16

## 2019-02-15 NOTE — Progress Notes (Signed)
MAIANA, [name] ([id number]) Visit Report for 02/12/2019 Chief Complaint Document Details Patient Name: [name], [name] Date of Service: 02/12/2019 9:15 AM Medical Record Number: [id number] Patient Account Number: [id number] Date of Birth/Sex: 1976/09/26 (43 y.o. F) Treating RN: Montey Hora Primary Care Provider: Priscille Kluver Other Clinician: Referring Provider: Priscille Kluver Treating Provider/Extender: STONE III, Alexiya Franqui Weeks in Treatment: 57 Information Obtained from: Patient Chief Complaint Left great toe ulcer Electronic Signature(s) Signed: 02/15/2019 8:49:36 AM By: Worthy Keeler PA-C Entered By: Worthy Keeler on 02/12/2019 10:50:39 [name] ([id number]) -------------------------------------------------------------------------------- Debridement Details Patient Name: [name] Date of Service: 02/12/2019 9:15 AM Medical Record Number: [id number] Patient Account Number: [id number] Date of Birth/Sex: 11-Dec-1975 (43 y.o. F) Treating RN: Cornell Barman Primary Care Provider: Priscille Kluver Other Clinician: Referring Provider: Priscille Kluver Treating Provider/Extender: Melburn Hake, Eldra Word Weeks in Treatment: 27 Debridement Performed for Wound #5 Left Toe Great Assessment: Performed By: Physician STONE III, Donshay Lupinski E., PA-C Debridement Type: Debridement Severity of Tissue Pre Bone involvement without necrosis Debridement: Level of Consciousness (Pre- Awake and Alert procedure): Pre-procedure Verification/Time Yes - 09:40 Out Taken: Start Time: 09:40 Pain Control: Lidocaine Total Area Debrided (L x W): 3 (cm) x 2.2 (cm) = 6.6 (cm) Tissue and other material Viable, Non-Viable, Bone, Slough, Subcutaneous, Slough debrided: Level: Skin/Subcutaneous Tissue/Muscle/Bone Debridement Description: Excisional Instrument: Curette Specimen: Swab, Tissue Culture Number of Specimens Taken: 1 Bleeding: Moderate Hemostasis Achieved: Pressure End Time: 09:50 Response to  Treatment: Procedure was tolerated well Level of Consciousness Awake and Alert (Post-procedure): Post Debridement Measurements of Total Wound Length: (cm) 3 Width: (cm) 2.2 Depth: (cm) 0.4 Volume: (cm) 2.073 Character of Wound/Ulcer Post Debridement: Requires Further Debridement Severity of Tissue Post Debridement: Bone involvement without necrosis Post Procedure Diagnosis Same as Pre-procedure Electronic Signature(s) Signed: 02/12/2019 3:23:54 PM By: Gretta Cool, BSN, RN, CWS, [name] RN, BSN Signed: 02/15/2019 8:49:36 AM By: Worthy Keeler PA-C Entered By: Gretta Cool, BSN, RN, CWS, [name] on 02/12/2019 09:50:33 Heather, Malori [name]. ([id number]) -------------------------------------------------------------------------------- HPI Details Patient Name: [name] Date of Service: 02/12/2019 9:15 AM Medical Record Number: [id number] Patient Account Number: [id number] Date of Birth/Sex: 03-21-1976 (43 y.o. F) Treating RN: Montey Hora Primary Care Provider: Priscille Kluver Other Clinician: Referring Provider: Priscille Kluver Treating Provider/Extender: Melburn Hake, Sapphire Tygart Weeks in Treatment: 27 History of Present Illness HPI Description: 01/18/18-She is here for initial evaluation of the left great toe ulcer. She is [name] poor historian in regards to timeframe in detail. She states approximately 4 weeks ago she lacerated her toe on something in the house. She followed up with her primary care who placed her on Bactrim and ultimately [name] second dose of Bactrim prior to coming to wound clinic. She states she has been treating the toe with peroxide, Betadine and [name] Band-Aid. She did not check her blood sugar this morning but checked it yesterday morning it was 327; she is unaware of [name] recent A1c and there are no current records. She saw Dr. she would've orthopedics last week for an old injury to the left ankle, she states he did not see her toe, nor did she bring it to his attention. She smokes approximately 1 pack  cigarettes [name] day. Her social situation is concerning, she arrives this morning with her mother who appears extremely intoxicated/under the influence; her mother was asked to leave the room and be monitored by the patient's grandmother. The patient's aunt then accompanied the patient and the room throughout the rest of the appointment. We had [name]  lengthy discussion regarding the deleterious effects of uncontrolled hyperglycemia and smoking as it relates to wound healing and overall health. She was strongly encouraged to decrease her smoking and get her diabetes under better control. She states she is currently on [name] diet and has cut down her [facility] consumption. The left toe is erythematous, macerated and slightly edematous with malodor present. The edema in her left foot is below her baseline, there is no erythema streaking. We will treat her with Santyl, doxycycline; we have ordered and xray, culture and provided [name] Peg assist surgical shoe and cultured the wound. 01/25/18-She is here in follow-up evaluation for [name] left great toe ulcer and presents with an abscess to her suprapubic area. She states her blood sugars remain elevated, feeling "sick" and if levels are below 250, but she is trying. She has made no attempt to decrease her smoking stating that we "can't take away her food in her cigarettes". She has been compliant with offloading using the PEG assist you. She is using Santyl daily. the culture obtained last week grew staph aureus and Enterococcus faecalis; continues on the doxycycline and Augmentin was added on Monday. The suprapubic area has erythema, no femoral variation, purple discoloration, minimal induration, was accessed with [name] cotton tip applicator with sanguinopurulent drainage, this was cultured, I suspect the current antibiotic treatment will cover and we will not add anything to her current treatment plan. She was advised to go to urgent care or ER with any change in redness,  induration or fever. 02/01/18-She is here in follow-up evaluation for left great toe ulcers and [name] new abdominal abscess from last week. She was able to use packing until earlier this week, where she "forgot it was there". She states she was feeling ill with GI symptoms last week and was not taking her antibiotic. She states her glucose levels have been predominantly less than 200, with occasional levels between 200-250. She thinks this was contributing to her GI symptoms as they have resolved without intervention. There continues to be significant laceration to left toe, otherwise it clinically looks stable/improved. There is now less superficial opening to the lateral aspect of the great toe that was residual blister. We will transition to [facility] to all wounds, she will continue her Augmentin. If there is no change or deterioration next week for reculture. 02/08/18-She is here in follow-up evaluation for left great toe ulcer and abdominal ulcer. There is an improvement in both wounds. She has been wrapping her left toe with coban, not by our direction, which has created an area of discoloration to the medial aspect; she has been advised to NOT use coban secondary to her neuropathy. She states her glucose levels have been high over this last week ranging from 200-350, she continues to smoke. She admits to being less compliant with her offloading shoe. We will continue with same treatment plan and she will follow-up next week. 02/15/18-She is here in follow-up evaluation for left great toe ulcer and abdominal ulcer. The abdominal ulcer is epithelialized. The left great toe ulcer is improved and all injury from last week using the Coban wrap is resolved, the lateral ulcer is healed. She admits to noncompliance with wearing offloading shoe and admits to glucose levels being greater than 300 most of the week. She continues to smoke and expresses no desire to quit. There is one area medially that  probes deeper than it has historically, erythema to the toe and dorsal foot has consistently waxed and waned.  There is no overt signs of cellulitis or infection but we will culture the wound for any occult infection given the new area of depth and erythema. We will hold off on sensitivities for initiation of antibiotic therapy. 02/22/18-She is here in follow up evaluation for left great toe ulcer. There is overall significant improvement in both wound appearance, erythema and edema with changes made last week. She was not initiated on antibiotic therapy. Culture obtained last week showed oxacillin sensitive staph aureus, sensitive to clindamycin. Clindamycin has been called into the pharmacy but she has been instructed to hold off on initiation secondary to overall clinical improvement and her history of antibiotic intolerance. She has been instructed to contact the clinic with any noted changes/deterioration and the wound, erythema, Connon, [place] [name]. ([id number]) edema and/or pain. She will follow-up next week. She continues to smoke and her glucose levels remain elevated >250; she admits to compliance with offloading shoe 03/01/18 on evaluation today patient appears to be doing fairly well in regard to her left first toe ulcer. She has been tolerating the dressing changes with the [facility] Dressing without complication and overall this has definitely showed signs of improvement according to records as well is what the patient tells me today. I'm very pleased in that regard. She is having no pain today 03/08/18 She is here for follow up evaluation of [name] left great toe ulcer. She remains non-compliant with glucose control and smoking cessation; glucose levels consistently >200. She states that she got new shoe inserts/peg assist. She admits to compliance with offloading. Since my last evaluation there is significant improvement. We will switch to prisma at this time and she will follow up next  week. She is noted to be tachycardic at this appointment, heart rate 120s; she has [name] history of heart rate 70-130 according to our records. She admits to extreme agitation r/t personal issues; she was advised to monitor her heartrate and contact her physician if it does not return to [name] more normal range (<100). She takes cardizem twice daily. 03/15/18-She is here in follow-up evaluation for left great toe ulcer. She remains noncompliant with glucose control and smoking cessation. She admits to compliance with wearing offloading shoe. The ulcer is improved/stable and we will continue with the same treatment plan and she will follow-up next week 03/22/18-She is here for evaluation for left great toe ulcer. There continues to be significant improvement despite recurrent hyperglycemia (over 500 yesterday) and she continues to smoke. She has been compliant with offloading and we will continue with same treatment plan and she will follow-up next week. 03/29/18-She is here for evaluation for left great toe ulcer. Despite continuing to smoke and uncontrolled diabetes she continues to improve. She is compliant with offloading shoe. We will continue with the same treatment plan and she will follow-up next week 04/05/18- She is here in follow up evaluation for [name] left great toe ulcer; she presents with small pustule to left fifth toe (resembles ant bite). She admits to compliance with wearing offloading shoe; continues to smoke or have uncontrolled blood glucose control. There is more callus than usual with evidence of bleeding; she denies known trauma. 04/12/18-She is here for evaluation of left great toe ulcer. Despite noncompliance with glycemic control and smoking she continues to make improvement. She continues to wear offloading shoe. The pustule, that was identified last week, to the left fifth toe is resolved. She will follow-up in 2 weeks 05/03/18-she is seen in follow-up evaluation for [name] left  great toe  ulcer. She is compliant with offloading, otherwise noncompliant with glycemic control and smoking. She has plateaued and there is minimal improvement noted. We will transition to [facility], replaced the insert to her surgical shoe and she will follow-up in one week 05/10/18- She is here in follow up evaluation for [name] left great toe ulcer. It appears stable despite measurement change. We will continue with same treatment plan and follow up next week. 05/24/18-She is seen in follow-up evaluation for [name] left great toe ulcer. She remains compliant with offloading, has made significant improvement in her diet, decreasing the amount of sugar/soda. She said her recent A1c was 10.9 which is lower than. She did see [name] diabetic nutritionist/educator yesterday. She continues to smoke. We will continue with the same treatment plan and she'll follow-up next week. 05/31/18- She is seen in follow-up evaluation for left great toe ulcer. She continues to remain compliant with offloading, continues to make improvement in her diet, increasing her water and decreasing the amount of sugar/soda. She does continue to smoke with no desire to quit. We will apply Prisma to the depth and Hydrofera Blue over. We have not received insurance authorization for oasis. She will follow up next week. 06/07/18-She is seen in follow-up evaluation for left great toe ulcer. It has stalled according to today's measurements although base appears stable. She says she saw [name] diabetic educator yesterday; her average blood sugars are less than 300 which is an improvement for her. She continues to smoke and states "that's my next step" She continues with water over soda. We will order for xray, culture and reinstate ace wrap compression prior to placing apligraf for next week. She is voicing no complaints or concerns. Her dressing will change to iodoflex over the next week in preparation for apligraf. 06/14/18-She is seen in follow-up evaluation for  left great toe ulcer. Plain film x-ray performed last week was negative for osteomyelitis. Wound culture obtained last week grew strep B and OSSA; she is initiated on keflex and cefdinir today; there is erythema to the toe which could be from ace wrap compression, she has [name] history of wrapping too tight and has has been encouraged to maintain ace wraps that we place today. We will hold off on application of apligraf today, will apply next week after antibiotic therapy has been initiated. She admits today that she has resumed taking [name] shower with her foot/toe submerged in water, she has been reminded to keep foot/toe out of the bath water. She will be seen in follow up next week 06/21/18-she is seen in follow-up evaluation for left great toe ulcer. She is tolerating antibiotic therapy with no GI disturbance. The wound is stable. Apligraf was applied today. She has been decreasing her smoking, only had 4 cigarettes yesterday and 1 today. She continues being more compliant in diabetic diet. She will follow-up next week for evaluation of site, if stable will remove at 2 weeks. 06/28/18- She is here in follow up evalution. Apligraf was placed last week, she states the dressing fell off on Tuesday and she was dressing with hydrofera blue. She is healed and will be discharged from the clinic today. She has been instructed to continue with smoking cessation, continue monitoring glucose levels, offloading for an additional 4 weeks and continue with Moroney, Tommy [name]. ([id number]) hydrofera blue for additional two weeks for any possible microscopic opening. Readmission: 08/07/18 on evaluation today patient presents for reevaluation concerning the ulcer of her right great toe. She  was previously discharged on 06/28/18 healed. Nonetheless she states that this began to show signs of drainage she subsequently went to her primary care provider. Subsequently an x-ray was performed on 08/01/18 which was negative. The  patient was also placed on antibiotics at that time. Fortunately they should have been effective for the infection. Nonetheless she's been experiencing some improvement but still has [name] lot of drainage coming from the wound itself. 08/14/18 on evaluation today patient's wound actually does show signs of improvement in regard to the erythema at this point. She has completed the antibiotics. With that being said we did discuss the possibility of placing her in [name] total contact cast as of today although I think that I may want to give this just [name] little bit more time to ensure nothing recurrence as far as her infection is concerned. I do not want to put in the cast and risk infection at that time if things are not completely resolved. With that being said she is gonna require some debridement today. 08/21/18 on evaluation today patient actually appears to be doing okay in regard to her toe ulcer. She's been tolerating the dressing changes without complication. With that being said it does appear that she is ready and in fact I think it's appropriate for [place] to go ahead and initiate the total contact cast today. Nonetheless she will require some sharp debridement to prepare the wound for application. Overall I feel like things have been progressing well but we do need to do something to get this to close more readily. 08/24/18 patient seen today for reevaluation after having had the total contact cast applied on Tuesday. She seems to have done very well the wound appears to be doing great and overall I'm pleased with the progress that she's made. There were no abnormal areas of rubbing from the cast on her lower extremity. 08/30/18 on evaluation today patient actually appears to be completely healed in regard to her plantar toe ulcer. She tells me at this point she's been having [name] lot of issues with the cast. She almost fell [name] couple of times the state shall the step of her dog [name] couple times as well. This  is been [name] very frustrating process for her other nonetheless she has completely healed the wound which is excellent news. Overall there does not appear to be the evidence of infection at this time which is great news. 09/11/18 evaluation today patient presents for follow-up concerning her great toe ulcer on the left which has unfortunately reopened since I last saw her which was only [name] couple of weeks ago. Unfortunately she was not able to get in to get the shoe and potentially the AFO that's gonna be necessary due to her left foot drop. She continues with offloading shoe but this is not enough to prevent her from reopening it appears. When we last had her in the total contact cast she did well from [name] healing standpoint but unfortunately the wound reopened as soon as she came out of the cast within just [name] couple of weeks. 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 This is also part of the reason why she falls on [name] regular basis. I just do not believe that is gonna be safe for [place]  to reinitiate the total contact cast as last time we had this on she fell 3 times one week which is definitely not normal for her. 09/18/18 upon evaluation today the patient actually appears to be doing about the same in regard to her toe ulcer. She did not contact [employer] as I asked her to even though I had given her the prescription. In fact she actually states that she has no idea where the prescription is. She did apparently call [employer] and they told her that all she needed to do was bring the prescription in order to be able to be seen and work on getting the AFO for her left foot. With all that being said she still does not have an appointment and I'm not sure were things stand that regard. I will give her [name] new prescription  today in order to contact them to get this set up. 09/25/18 on evaluation today patient actually appears to be doing about the same in regard to her toes ulcer. She does have [name] small areas which seems to have [name] lot of callous buildup around the edge of the wound which is going to need sharp debridement today. She still is waiting to be scheduled for evaluation with [employer] for possibility of an AFO. She states there supposed to call her tomorrow to get this set up. Unfortunately it does appear that her foot specifically the toe area is showing signs of erythema. There does not appear to be any systemic infection which is in these good news. 10/02/18 on evaluation today patient actually appears to be doing about the same in regard to her toe ulcer. This really has not done too well although it's not significantly larger it's also not significantly smaller. She has been tolerating the dressing changes without complication. She actually has her appointment with [employer] and [place] tomorrow to hopefully be measured for obtaining and AFO splint. I think this would be helpful preventing this from reoccurring. We had contemplated starting the cast this week although to be honest I am reluctant to do that as she's been having nausea, vomiting, and seizure activity over the past three days. She has [name] history of seizures and have been told is nothing that can be done for these. With Pehrson, Selda [name]. ([id number]) that being said I do believe that along with the seizures have the nausea vomiting which upon further questioning doesn't seem to be the normal for her and makes me concerned for the possibility of infection or something else going on. I discussed this with the patient and her mother during the office visit today. I do not feel the wound is effective but maybe something else. The responses this was "this just happens to her at times and we don't know why". They did not seem to be interested in going  to the hospital to have this checked out further. 10/09/18 on evaluation today patient presents for follow-up concerning her ongoing toe ulcer. She has been tolerating the dressing changes without complication. Fortunately there does not appear to be any evidence of infection which is great news however I do think that the patient would benefit from going ahead for with the total contact cast. She's actually in [name] wheelchair today she tells me that she will use her walker if we initiate the cast. I was very specific about the fact that if we were gonna do the cast I wanted to make sure that she was using the walker in order to prevent any  falls. She tells me she does not have stairs that she has to traverse on [name] regular basis at her home. She has not had any seizures since last week again that something that happens to her often she tells me she did talk to [employer] and they said that it may take up to three weeks to get the brace approved for her. Hopefully that will not take that long but nonetheless in the meantime I do think the cast could be of benefit. 10/12/18 on evaluation today patient appears to be doing rather well in regard to her toe ulcer. It's just been [name] few days and already this is significantly improved both as far as overall appearance and size. Fortunately there's no sign of infection. She is here for her first obligatory cast change. 10/19/18 Seen today for follow up and management of left great toe ulcer. Wound continues to show improvement. Noted small open area with seroussang drainage with palpation. Denies any increased pain or recent fevers during visit. She will continue calcium alginate with offloading shoe. Denies any questions or concerns during visit. 10/26/18 on evaluation today patient appears to be doing about the same as when I last saw her in regard to her wound bed. Fortunately there does not appear to be any signs of infection. Unfortunately she continues to have [name]  breakdown in regard to the toe region any time that she is not in the cast. It takes almost no time at all for this to happen. Nonetheless she still has not heard anything from the brace being made by [employer] as to when exactly this will be available to her. Fortunately there is no signs of infection at this time. 10/30/18 on evaluation today patient presents for application of the total contact cast as we just received him this morning. Fortunately we are gonna be able to apply this to her today which is great news. She continues to have no significant pain which is good news. Overall I do feel like things have been improving while she was the cast is when she doesn't have [name] cast that things get worse. She still has not really heard anything from [facility] regarding her brace. 11/02/18 upon evaluation today patient's wound already appears to be doing significantly better which is good news. Fortunately there does not appear to be any signs of infection also good news. Overall I do think the total contact cast as before is helping to heal this area unfortunately it's just not gonna likely keep the area closed and healed without her getting her brace at least. Again the foot drop is [name] significant issue for her. 11/09/18 on evaluation today patient appears to be doing excellent in regard to her toe ulcer which in fact is completely healed. Fortunately we finally got the situation squared away with the paperwork which was needed to proceed with getting her brace approved by Medicaid. I have filled that out unfortunately that information has been sent to the orthopedic office that I worked at 2 1/2 years ago and not tired Current wound care measures. Fortunately she seems to be doing very well at this time. 11/23/18 on evaluation today patient appears to be doing More Poorly Compared to Last Time I Saw Her. At [facility] She Had Completely Healed. Currently she is continuing to have issues with reopening. She  states that she just found out that the brace was approved through Medicaid now she just has to go get measured in order to have this fitted  for her and then made. Subsequently she does not have an appointment for this yet that is going to complicate things we obviously cannot put her back in the cast if we do not have everything measured because they're not gonna be able to measure her foot while she is in the cast. 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 Obviously this is unfortunate and does have me somewhat worried as well. 11/30/18 on evaluation today patient's toe ulcer actually appears to be doing fairly well. The good news is she will be getting her brace in the shoes next week on Wednesday. Hopefully we will be able to get this to heal without having to go back in the cast however she may need the cast in order to get the wound completely heal and then go from there. Fortunately there's no signs of infection at this time. 12/07/18 on evaluation today patient fortunately did receive her brace and she states she could tell this definitely makes her walk better. With that being said she's been having issues with her toe where she noticed yesterday there was [name] lot of tissue Schwalb, Yeila [name]. ([id number]) that was loosing off this appears to be much larger than what it was previous. She also states that her leg has been read putting much across the top of her foot just about the ankle although this seems to be receiving somewhat. The total area is still red and appears to be someone infected as best I can tell. She is previously taken Bactrim and that may be [name] good option for her today as well. We are gonna see what I wound culture shows as well and I think that this is definitely appropriate. With that being said outside of the culture I still need to initiate something in the interim and that's  what I'm gonna go ahead and select Bactrim is [name] good option for her. 12/14/18 on evaluation today patient appears to be doing better in regard to her left great toe ulcer as compared to last week's evaluation. There's still some erythema although this is significantly improved which is excellent news. Overall I do believe that she is making good progress is still gonna take some time before she is where I would like her to be from the standpoint of being able to place her back into the total contact cast. Hopefully we will be where we need to be by next week. 12/21/18 on evaluation today patient actually appears to be doing poorly in regard to her toe ulcer. She's been tolerating the dressing changes without complication. Fortunately there's no signs of systemic infection although she does have [name] lot of drainage from the toe ulcer and this does seem to be causing some issues at this point. She does have erythema on the distal portion of her toe that appears to be likely cellulitis. 12/28/18 on evaluation today patient actually appears to be doing [name] little better in my pinion in regard to her toe ulcer. With that being said she still does have some evidence of infection at this time and for her culture she had both E. coli as well as enterococcus as organisms noted on evaluation. For that reason I think that though the Keflex likely has treated the E. coli rather well this has really done nothing for the enterococcus. We are going to have to initiate treatment for this specifically.  01/04/19 on evaluation today patient's toe actually appears to be doing better from the standpoint of infection. She currently would like to see about putting the cash back on I think that this is appropriate as long as she takes care of it and keeps it from getting wet. She is gonna have some drainage we can definitely pass this up with Drawtex and alginate to try to prevent as much drainage as possible from causing the  problems. With that being said I do want to at least try her with the cast between now and Tuesday. If there any issues we can't continue to use it then I will discontinue the use of the cast at that point. 01/08/19 on evaluation today patient actually appears to be doing very well as far as her foot ulcer specifically the great toe on the left is concerned. She did have an area of rubbing on the medial aspect of her left ankle which again is from the cast. Fortunately there's no signs of infection at this point in this appears to be [name] very slight skin breakdown. The patient tells me she felt it rubbing but didn't think it was that bad. Fortunately there is no signs of active infection at this time which is good news. No fevers, chills, nausea, or vomiting noted at this time. 01/15/19 on evaluation today patient actually appears to be doing well in regard to her toe ulcer. Again as previous she seems to do well and she has the cast on which indicates to me that during the time she doesn't have [name] cast on she's putting way too much pressure on this region. Obviously I think that's gonna be an issue as with the current national emergency concerning the Covid-19 Virus it has been recommended that we discontinue the use of total contact casting by the chief medical officer of our company, Dr. Simona Huh. The reasoning is that if [name] patient becomes sick and cannot come into have the cast removed they could not just leave this on for an additional two weeks. Obviously the hospitals also do not want to receive patient's who are sick into the emergency department to potentially contaminate the region and spread the Covid-19 Virus among other sick individuals within the hospital system. Therefore at this point we are suspending the use of total contact cast until the current emergency subsides. This was all discussed with the patient today as well. 01/22/19 on evaluation today patient's wound on her left great toe  appears to be doing slightly worse than previously noted last week. She tells me that she has been on this quite [name] bit in fact she tells me she's been awake for 38 straight hours. This is due to the fact that she's having to care for grandparents because nobody else will. She has been taking care of them for five the last seven days since I've seen her they both have dementia his is from [name] stroke and her grandmother's was progressive. Nonetheless she states even her mom who knows her condition and situation has only help two of those days to take care of them she's been taking care of the rest. Fortunately there does not appear to be any signs of active infection in regard to her toe at this point although obviously it doesn't look as good as it did previous. I think this is directly related to her not taking off the pressure and friction by way of taking things easy. Though I completely understand what's going on. 01/29/19  on evaluation today patient's tools are actually appears to be showing some signs of improvement today compared to last week's evaluation as far as not necessarily the overall size of the wound but the fact that she has some new skin growth in between the two ends of the wound opening. Overall I feel like she has done well she states that she had [name] family member give her what sounds to be [name] CAM walker boot which has been helpful as well. 02/05/19 on evaluation today patient's wound bed actually appears to be doing significantly better in regard to her overall Verrill, Lagina [name]. ([id number]) appearance of the size of the wound. With that being said she is still having an issue with offloading efficiently enough to get this to close. Apparently there is some signs of infection at this point as well unfortunately. Previously she's done well of Augmentin I really do not see anything that needs to be culture currently but there theme and cellulitis of the foot that I'm seeing I'm gonna go  ahead and place her on an antibiotic today to try to help clear this up. 02/12/2019 on evaluation today patient actually appears to be doing poorly in regard to her overall wound status. She tells me she has been using her offloading shoe but actually comes in today wearing her tennis shoe with the AFO brace. Again as I previously discussed with her this is really not sufficient to allow the area to heal appropriately. Nonetheless she continues to be somewhat noncompliant and I do wonder based on what she has told my nurse in the past as to whether or not [name] good portion of this noncompliance may be recreational drug and alcohol related. She has had [name] history of heroin overdose and this was fairly recently in the past couple of months that have been seeing her. Nonetheless overall I feel like her wound looks significantly worse today compared to what it was previous. She still has significant erythema despite the Augmentin I am not sure that this is an appropriate medication for her infection I am also concerned that the infection may have gone down into her bone. Electronic Signature(s) Signed: 02/15/2019 8:49:36 AM By: Worthy Keeler PA-C Entered By: Worthy Keeler on 02/12/2019 10:52:42 [name] ([id number]) -------------------------------------------------------------------------------- Physical Exam Details Patient Name: [name] Date of Service: 02/12/2019 9:15 AM Medical Record Number: [id number] Patient Account Number: [id number] Date of Birth/Sex: 1976-07-31 (43 y.o. F) Treating RN: Montey Hora Primary Care Provider: Priscille Kluver Other Clinician: Referring Provider: Priscille Kluver Treating Provider/Extender: STONE III, Purcell Jungbluth Weeks in Treatment: 57 Constitutional Well-nourished and well-hydrated in no acute distress. Respiratory normal breathing without difficulty. clear to auscultation bilaterally. Cardiovascular regular rate and rhythm with normal S1,  S2. Psychiatric this patient is able to make decisions and demonstrates good insight into disease process. Alert and Oriented x 3. pleasant and cooperative. Notes Patient's wound VAC currently showed significant necrotic tissue as well as callus over the surface of the wound. Subsequently I did perform debridement today in order to perform [name] good deep wound culture. However as I was debriding the wound it became noted that the patient had what appears to be nonviable bone and the proximal portion of the wound opening and I was actually able to obtain [name] scraping from the bone in order to send for culture as well. I really did not have enough to send for pathology however at this point. Nonetheless I do think that this likely may  represent an osteomyelitis situation at this point. Electronic Signature(s) Signed: 02/15/2019 8:49:36 AM By: Worthy Keeler PA-C Entered By: Worthy Keeler on 02/12/2019 10:54:06 [name] ([id number]) -------------------------------------------------------------------------------- Physician Orders Details Patient Name: [name] Date of Service: 02/12/2019 9:15 AM Medical Record Number: [id number] Patient Account Number: [id number] Date of Birth/Sex: 10/22/1976 (43 y.o. F) Treating RN: Cornell Barman Primary Care Provider: Priscille Kluver Other Clinician: Referring Provider: Priscille Kluver Treating Provider/Extender: Melburn Hake, Fajr Fife Weeks in Treatment: 37 Verbal / Phone Orders: No Diagnosis Coding Wound Cleansing Wound #5 Left Toe Great o Clean wound with Normal Saline. Anesthetic (add to Medication List) Wound #5 Left Toe Great o Topical Lidocaine 4% cream applied to wound bed prior to debridement (In Clinic Only). Primary Wound Dressing Wound #5 Left Toe Great o Silver Alginate Secondary Dressing Wound #5 Left Toe Great o Conform/Kerlix o Foam Dressing Change Frequency Wound #5 Left Toe Great o Three times weekly Follow-up  Appointments Wound #5 Left Toe Great o Return Appointment in 1 week. Off-Loading Wound #5 Left Toe Great o Open toe surgical shoe to: - Wear your surgical shoe with your brace. No tennis Shoes! Additional Orders / Instructions Wound #5 Left Toe Great o Increase protein intake. Medications-please add to medication list. Wound #5 Left Toe Great o P.O. Antibiotics Consults o Infectious Disease Gutmann, Lindzie [name]. ([id number]) Laboratory o Bacteria identified in Wound by Culture (MICRO) oooo LOINC Code: 3790-2 oooo Convenience Name: Wound culture routine Radiology o X-ray, foot - 3-view Foot - Non healing wound on great toe. Electronic Signature(s) Signed: 02/13/2019 2:28:39 PM By: Montey Hora Signed: 02/15/2019 8:49:36 AM By: Worthy Keeler PA-C Previous Signature: 02/12/2019 3:23:54 PM Version By: Gretta Cool BSN, RN, CWS, [name] RN, BSN Entered By: Montey Hora on 02/13/2019 08:59:59 [name] ([id number]) -------------------------------------------------------------------------------- Problem List Details Patient Name: [name] Date of Service: 02/12/2019 9:15 AM Medical Record Number: [id number] Patient Account Number: [id number] Date of Birth/Sex: Apr 26, 1976 (43 y.o. F) Treating RN: Montey Hora Primary Care Provider: Priscille Kluver Other Clinician: Referring Provider: Priscille Kluver Treating Provider/Extender: Melburn Hake, Solana Coggin Weeks in Treatment: 27 Active Problems ICD-10 Evaluated Encounter Code Description Active Date Today Diagnosis E11.621 Type 2 diabetes mellitus with foot ulcer 08/07/2018 No Yes L97.522 Non-pressure chronic ulcer of other part of left foot with fat 08/08/2018 No Yes layer exposed M21.372 Foot drop, left foot 09/11/2018 No Yes Inactive Problems Resolved Problems ICD-10 Code Description Active Date Resolved Date L03.032 Cellulitis of left toe 08/07/2018 08/07/2018 Electronic Signature(s) Signed: 02/15/2019 8:49:36 AM By:  Worthy Keeler PA-C Entered By: Worthy Keeler on 02/12/2019 10:50:33 Vadala, Tereka [name]. ([id number]) -------------------------------------------------------------------------------- Progress Note Details Patient Name: [name] Date of Service: 02/12/2019 9:15 AM Medical Record Number: [id number] Patient Account Number: [id number] Date of Birth/Sex: 20-Aug-1976 (43 y.o. F) Treating RN: Montey Hora Primary Care Provider: Priscille Kluver Other Clinician: Referring Provider: Priscille Kluver Treating Provider/Extender: Melburn Hake, Zavior Thomason Weeks in Treatment: 27 Subjective Chief Complaint Information obtained from Patient Left great toe ulcer History of Present Illness (HPI) 01/18/18-She is here for initial evaluation of the left great toe ulcer. She is [name] poor historian in regards to timeframe in detail. She states approximately 4 weeks ago she lacerated her toe on something in the house. She followed up with her primary care who placed her on Bactrim and ultimately [name] second dose of Bactrim prior to coming to wound clinic. She states she has been treating the toe with peroxide, Betadine and [name] Band-Aid.  She did not check her blood sugar this morning but checked it yesterday morning it was 327; she is unaware of [name] recent A1c and there are no current records. She saw Dr. she would've orthopedics last week for an old injury to the left ankle, she states he did not see her toe, nor did she bring it to his attention. She smokes approximately 1 pack cigarettes [name] day. Her social situation is concerning, she arrives this morning with her mother who appears extremely intoxicated/under the influence; her mother was asked to leave the room and be monitored by the patient's grandmother. The patient's aunt then accompanied the patient and the room throughout the rest of the appointment. We had [name] lengthy discussion regarding the deleterious effects of uncontrolled hyperglycemia and smoking as it relates to  wound healing and overall health. She was strongly encouraged to decrease her smoking and get her diabetes under better control. She states she is currently on [name] diet and has cut down her [facility] consumption. The left toe is erythematous, macerated and slightly edematous with malodor present. The edema in her left foot is below her baseline, there is no erythema streaking. We will treat her with Santyl, doxycycline; we have ordered and xray, culture and provided [name] Peg assist surgical shoe and cultured the wound. 01/25/18-She is here in follow-up evaluation for [name] left great toe ulcer and presents with an abscess to her suprapubic area. She states her blood sugars remain elevated, feeling "sick" and if levels are below 250, but she is trying. She has made no attempt to decrease her smoking stating that we "can't take away her food in her cigarettes". She has been compliant with offloading using the PEG assist you. She is using Santyl daily. the culture obtained last week grew staph aureus and Enterococcus faecalis; continues on the doxycycline and Augmentin was added on Monday. The suprapubic area has erythema, no femoral variation, purple discoloration, minimal induration, was accessed with [name] cotton tip applicator with sanguinopurulent drainage, this was cultured, I suspect the current antibiotic treatment will cover and we will not add anything to her current treatment plan. She was advised to go to urgent care or ER with any change in redness, induration or fever. 02/01/18-She is here in follow-up evaluation for left great toe ulcers and [name] new abdominal abscess from last week. She was able to use packing until earlier this week, where she "forgot it was there". She states she was feeling ill with GI symptoms last week and was not taking her antibiotic. She states her glucose levels have been predominantly less than 200, with occasional levels between 200-250. She thinks this was contributing to  her GI symptoms as they have resolved without intervention. There continues to be significant laceration to left toe, otherwise it clinically looks stable/improved. There is now less superficial opening to the lateral aspect of the great toe that was residual blister. We will transition to [facility] to all wounds, she will continue her Augmentin. If there is no change or deterioration next week for reculture. 02/08/18-She is here in follow-up evaluation for left great toe ulcer and abdominal ulcer. There is an improvement in both wounds. She has been wrapping her left toe with coban, not by our direction, which has created an area of discoloration to the medial aspect; she has been advised to NOT use coban secondary to her neuropathy. She states her glucose levels have been high over this last week ranging from 200-350, she continues to  smoke. She admits to being less compliant with her offloading shoe. We will continue with same treatment plan and she will follow-up next week. 02/15/18-She is here in follow-up evaluation for left great toe ulcer and abdominal ulcer. The abdominal ulcer is epithelialized. The left great toe ulcer is improved and all injury from last week using the Coban wrap is resolved, the lateral ulcer is healed. She admits to noncompliance with wearing offloading shoe and admits to glucose levels being greater than 300 most of the week. She continues to smoke and expresses no desire to quit. There is one area medially that probes deeper than it has historically, erythema to the toe and dorsal foot has consistently waxed and waned. There is no overt signs of cellulitis or infection but we will culture the wound for any occult infection given the new area of depth and erythema. We will hold off on Vanwagner, Carlissa [name]. ([id number]) sensitivities for initiation of antibiotic therapy. 02/22/18-She is here in follow up evaluation for left great toe ulcer. There is overall significant  improvement in both wound appearance, erythema and edema with changes made last week. She was not initiated on antibiotic therapy. Culture obtained last week showed oxacillin sensitive staph aureus, sensitive to clindamycin. Clindamycin has been called into the pharmacy but she has been instructed to hold off on initiation secondary to overall clinical improvement and her history of antibiotic intolerance. She has been instructed to contact the clinic with any noted changes/deterioration and the wound, erythema, edema and/or pain. She will follow-up next week. She continues to smoke and her glucose levels remain elevated >250; she admits to compliance with offloading shoe 03/01/18 on evaluation today patient appears to be doing fairly well in regard to her left first toe ulcer. She has been tolerating the dressing changes with the [facility] Dressing without complication and overall this has definitely showed signs of improvement according to records as well is what the patient tells me today. I'm very pleased in that regard. She is having no pain today 03/08/18 She is here for follow up evaluation of [name] left great toe ulcer. She remains non-compliant with glucose control and smoking cessation; glucose levels consistently >200. She states that she got new shoe inserts/peg assist. She admits to compliance with offloading. Since my last evaluation there is significant improvement. We will switch to prisma at this time and she will follow up next week. She is noted to be tachycardic at this appointment, heart rate 120s; she has [name] history of heart rate 70-130 according to our records. She admits to extreme agitation r/t personal issues; she was advised to monitor her heartrate and contact her physician if it does not return to [name] more normal range (<100). She takes cardizem twice daily. 03/15/18-She is here in follow-up evaluation for left great toe ulcer. She remains noncompliant with glucose control  and smoking cessation. She admits to compliance with wearing offloading shoe. The ulcer is improved/stable and we will continue with the same treatment plan and she will follow-up next week 03/22/18-She is here for evaluation for left great toe ulcer. There continues to be significant improvement despite recurrent hyperglycemia (over 500 yesterday) and she continues to smoke. She has been compliant with offloading and we will continue with same treatment plan and she will follow-up next week. 03/29/18-She is here for evaluation for left great toe ulcer. Despite continuing to smoke and uncontrolled diabetes she continues to improve. She is compliant with offloading shoe. We will continue with the  same treatment plan and she will follow-up next week 04/05/18- She is here in follow up evaluation for [name] left great toe ulcer; she presents with small pustule to left fifth toe (resembles ant bite). She admits to compliance with wearing offloading shoe; continues to smoke or have uncontrolled blood glucose control. There is more callus than usual with evidence of bleeding; she denies known trauma. 04/12/18-She is here for evaluation of left great toe ulcer. Despite noncompliance with glycemic control and smoking she continues to make improvement. She continues to wear offloading shoe. The pustule, that was identified last week, to the left fifth toe is resolved. She will follow-up in 2 weeks 05/03/18-she is seen in follow-up evaluation for [name] left great toe ulcer. She is compliant with offloading, otherwise noncompliant with glycemic control and smoking. She has plateaued and there is minimal improvement noted. We will transition to [facility], replaced the insert to her surgical shoe and she will follow-up in one week 05/10/18- She is here in follow up evaluation for [name] left great toe ulcer. It appears stable despite measurement change. We will continue with same treatment plan and follow up next  week. 05/24/18-She is seen in follow-up evaluation for [name] left great toe ulcer. She remains compliant with offloading, has made significant improvement in her diet, decreasing the amount of sugar/soda. She said her recent A1c was 10.9 which is lower than. She did see [name] diabetic nutritionist/educator yesterday. She continues to smoke. We will continue with the same treatment plan and she'll follow-up next week. 05/31/18- She is seen in follow-up evaluation for left great toe ulcer. She continues to remain compliant with offloading, continues to make improvement in her diet, increasing her water and decreasing the amount of sugar/soda. She does continue to smoke with no desire to quit. We will apply Prisma to the depth and Hydrofera Blue over. We have not received insurance authorization for oasis. She will follow up next week. 06/07/18-She is seen in follow-up evaluation for left great toe ulcer. It has stalled according to today's measurements although base appears stable. She says she saw [name] diabetic educator yesterday; her average blood sugars are less than 300 which is an improvement for her. She continues to smoke and states "that's my next step" She continues with water over soda. We will order for xray, culture and reinstate ace wrap compression prior to placing apligraf for next week. She is voicing no complaints or concerns. Her dressing will change to iodoflex over the next week in preparation for apligraf. 06/14/18-She is seen in follow-up evaluation for left great toe ulcer. Plain film x-ray performed last week was negative for osteomyelitis. Wound culture obtained last week grew strep B and OSSA; she is initiated on keflex and cefdinir today; there is erythema to the toe which could be from ace wrap compression, she has [name] history of wrapping too tight and has has been encouraged to maintain ace wraps that we place today. We will hold off on application of apligraf today, will apply next  week after antibiotic therapy has been initiated. She admits today that she has resumed taking [name] shower with her foot/toe submerged in water, she has been reminded to keep foot/toe out of the bath water. She will be seen in follow up next week 06/21/18-she is seen in follow-up evaluation for left great toe ulcer. She is tolerating antibiotic therapy with no GI disturbance. The wound is stable. Apligraf was applied today. She has been decreasing her smoking, only had 4  cigarettes yesterday and Decesare, Carlethia [name]. ([id number]) 1 today. She continues being more compliant in diabetic diet. She will follow-up next week for evaluation of site, if stable will remove at 2 weeks. 06/28/18- She is here in follow up evalution. Apligraf was placed last week, she states the dressing fell off on Tuesday and she was dressing with hydrofera blue. She is healed and will be discharged from the clinic today. She has been instructed to continue with smoking cessation, continue monitoring glucose levels, offloading for an additional 4 weeks and continue with hydrofera blue for additional two weeks for any possible microscopic opening. Readmission: 08/07/18 on evaluation today patient presents for reevaluation concerning the ulcer of her right great toe. She was previously discharged on 06/28/18 healed. Nonetheless she states that this began to show signs of drainage she subsequently went to her primary care provider. Subsequently an x-ray was performed on 08/01/18 which was negative. The patient was also placed on antibiotics at that time. Fortunately they should have been effective for the infection. Nonetheless she's been experiencing some improvement but still has [name] lot of drainage coming from the wound itself. 08/14/18 on evaluation today patient's wound actually does show signs of improvement in regard to the erythema at this point. She has completed the antibiotics. With that being said we did discuss the possibility of  placing her in [name] total contact cast as of today although I think that I may want to give this just [name] little bit more time to ensure nothing recurrence as far as her infection is concerned. I do not want to put in the cast and risk infection at that time if things are not completely resolved. With that being said she is gonna require some debridement today. 08/21/18 on evaluation today patient actually appears to be doing okay in regard to her toe ulcer. She's been tolerating the dressing changes without complication. With that being said it does appear that she is ready and in fact I think it's appropriate for [place] to go ahead and initiate the total contact cast today. Nonetheless she will require some sharp debridement to prepare the wound for application. Overall I feel like things have been progressing well but we do need to do something to get this to close more readily. 08/24/18 patient seen today for reevaluation after having had the total contact cast applied on Tuesday. She seems to have done very well the wound appears to be doing great and overall I'm pleased with the progress that she's made. There were no abnormal areas of rubbing from the cast on her lower extremity. 08/30/18 on evaluation today patient actually appears to be completely healed in regard to her plantar toe ulcer. She tells me at this point she's been having [name] lot of issues with the cast. She almost fell [name] couple of times the state shall the step of her dog [name] couple times as well. This is been [name] very frustrating process for her other nonetheless she has completely healed the wound which is excellent news. Overall there does not appear to be the evidence of infection at this time which is great news. 09/11/18 evaluation today patient presents for follow-up concerning her great toe ulcer on the left which has unfortunately reopened since I last saw her which was only [name] couple of weeks ago. Unfortunately she was not able to get  in to get the shoe and potentially the AFO that's gonna be necessary due to her left foot drop. She continues with  offloading shoe but this is not enough to prevent her from reopening it appears. When we last had her in the total contact cast she did well from [name] healing standpoint but unfortunately the wound reopened as soon as she came out of the cast within just [name] couple of weeks. 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 This is also part of the reason why she falls on [name] regular basis. I just do not believe that is gonna be safe for [place] to reinitiate the total contact cast as last time we had this on she fell 3 times one week which is definitely not normal for her. 09/18/18 upon evaluation today the patient actually appears to be doing about the same in regard to her toe ulcer. She did not contact [employer] as I asked her to even though I had given her the prescription. In fact she actually states that she has no idea where the prescription is. She did apparently call [facility] and they told her that all she needed to do was bring the prescription in order to be able to be seen and work on getting the AFO for her left foot. With all that being said she still does not have an appointment and I'm not sure were things stand that regard. I will give her [name] new prescription today in order to contact them to get this set up. 09/25/18 on evaluation today patient actually appears to be doing about the same in regard to her toes ulcer. She does have [name] small areas which seems to have [name] lot of callous buildup around the edge of the wound which is going to need sharp debridement today. She still is waiting to be scheduled for evaluation with [facility] for possibility of an AFO. She states there supposed to call her  tomorrow to get this set up. Unfortunately it does appear that her foot specifically the toe area is showing signs of erythema. There does not appear to be any systemic infection which is in these good news. WYNN, KERNES [name]. ([id number]) 10/02/18 on evaluation today patient actually appears to be doing about the same in regard to her toe ulcer. This really has not done too well although it's not significantly larger it's also not significantly smaller. She has been tolerating the dressing changes without complication. She actually has her appointment with [facility] and [place] tomorrow to hopefully be measured for obtaining and AFO splint. I think this would be helpful preventing this from reoccurring. We had contemplated starting the cast this week although to be honest I am reluctant to do that as she's been having nausea, vomiting, and seizure activity over the past three days. She has [name] history of seizures and have been told is nothing that can be done for these. With that being said I do believe that along with the seizures have the nausea vomiting which upon further questioning doesn't seem to be the normal for her and makes me concerned for the possibility of infection or something else going on. I discussed this with the patient and her mother during the office visit today. I do not feel the wound is effective but maybe something else. The responses this was "this just happens to her at times and we don't know why". They did  not seem to be interested in going to the hospital to have this checked out further. 10/09/18 on evaluation today patient presents for follow-up concerning her ongoing toe ulcer. She has been tolerating the dressing changes without complication. Fortunately there does not appear to be any evidence of infection which is great news however I do think that the patient would benefit from going ahead for with the total contact cast. She's actually in [name] wheelchair today she  tells me that she will use her walker if we initiate the cast. I was very specific about the fact that if we were gonna do the cast I wanted to make sure that she was using the walker in order to prevent any falls. She tells me she does not have stairs that she has to traverse on [name] regular basis at her home. She has not had any seizures since last week again that something that happens to her often she tells me she did talk to [employer] and they said that it may take up to three weeks to get the brace approved for her. Hopefully that will not take that long but nonetheless in the meantime I do think the cast could be of benefit. 10/12/18 on evaluation today patient appears to be doing rather well in regard to her toe ulcer. It's just been [name] few days and already this is significantly improved both as far as overall appearance and size. Fortunately there's no sign of infection. She is here for her first obligatory cast change. 10/19/18 Seen today for follow up and management of left great toe ulcer. Wound continues to show improvement. Noted small open area with seroussang drainage with palpation. Denies any increased pain or recent fevers during visit. She will continue calcium alginate with offloading shoe. Denies any questions or concerns during visit. 10/26/18 on evaluation today patient appears to be doing about the same as when I last saw her in regard to her wound bed. Fortunately there does not appear to be any signs of infection. Unfortunately she continues to have [name] breakdown in regard to the toe region any time that she is not in the cast. It takes almost no time at all for this to happen. Nonetheless she still has not heard anything from the brace being made by [employer] as to when exactly this will be available to her. Fortunately there is no signs of infection at this time. 10/30/18 on evaluation today patient presents for application of the total contact cast as we just received him this  morning. Fortunately we are gonna be able to apply this to her today which is great news. She continues to have no significant pain which is good news. Overall I do feel like things have been improving while she was the cast is when she doesn't have [name] cast that things get worse. She still has not really heard anything from [place] regarding her brace. 11/02/18 upon evaluation today patient's wound already appears to be doing significantly better which is good news. Fortunately there does not appear to be any signs of infection also good news. Overall I do think the total contact cast as before is helping to heal this area unfortunately it's just not gonna likely keep the area closed and healed without her getting her brace at least. Again the foot drop is [name] significant issue for her. 11/09/18 on evaluation today patient appears to be doing excellent in regard to her toe ulcer which in fact is completely healed. Fortunately we finally got  the situation squared away with the paperwork which was needed to proceed with getting her brace approved by Medicaid. I have filled that out unfortunately that information has been sent to the orthopedic office that I worked at 2 1/2 years ago and not tired Current wound care measures. Fortunately she seems to be doing very well at this time. 11/23/18 on evaluation today patient appears to be doing More Poorly Compared to Last Time I Saw Her. At [facility] She Had Completely Healed. Currently she is continuing to have issues with reopening. She states that she just found out that the brace was approved through Medicaid now she just has to go get measured in order to have this fitted for her and then made. Subsequently she does not have an appointment for this yet that is going to complicate things we obviously cannot put her back in the cast if we do not have everything measured because they're not gonna be able to measure her foot while she is in the cast.  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 Obviously this is unfortunate and does have me somewhat worried as well. 11/30/18 on evaluation today patient's toe ulcer actually appears to be doing fairly well. The good news is she will be getting her Lantigua, Miliyah [name]. ([id number]) brace in the shoes next week on Wednesday. Hopefully we will be able to get this to heal without having to go back in the cast however she may need the cast in order to get the wound completely heal and then go from there. Fortunately there's no signs of infection at this time. 12/07/18 on evaluation today patient fortunately did receive her brace and she states she could tell this definitely makes her walk better. With that being said she's been having issues with her toe where she noticed yesterday there was [name] lot of tissue that was loosing off this appears to be much larger than what it was previous. She also states that her leg has been read putting much across the top of her foot just about the ankle although this seems to be receiving somewhat. The total area is still red and appears to be someone infected as best I can tell. She is previously taken Bactrim and that may be [name] good option for her today as well. We are gonna see what I wound culture shows as well and I think that this is definitely appropriate. With that being said outside of the culture I still need to initiate something in the interim and that's what I'm gonna go ahead and select Bactrim is [name] good option for her. 12/14/18 on evaluation today patient appears to be doing better in regard to her left great toe ulcer as compared to last week's evaluation. There's still some erythema although this is significantly improved which is excellent news. Overall I do believe that she is making good progress is still gonna take some time before she is where I would like her  to be from the standpoint of being able to place her back into the total contact cast. Hopefully we will be where we need to be by next week. 12/21/18 on evaluation today patient actually appears to be doing poorly in regard to her toe ulcer. She's been tolerating the dressing changes without complication. Fortunately there's no signs of systemic infection although she does have [name] lot of drainage from  the toe ulcer and this does seem to be causing some issues at this point. She does have erythema on the distal portion of her toe that appears to be likely cellulitis. 12/28/18 on evaluation today patient actually appears to be doing [name] little better in my pinion in regard to her toe ulcer. With that being said she still does have some evidence of infection at this time and for her culture she had both E. coli as well as enterococcus as organisms noted on evaluation. For that reason I think that though the Keflex likely has treated the E. coli rather well this has really done nothing for the enterococcus. We are going to have to initiate treatment for this specifically. 01/04/19 on evaluation today patient's toe actually appears to be doing better from the standpoint of infection. She currently would like to see about putting the cash back on I think that this is appropriate as long as she takes care of it and keeps it from getting wet. She is gonna have some drainage we can definitely pass this up with Drawtex and alginate to try to prevent as much drainage as possible from causing the problems. With that being said I do want to at least try her with the cast between now and Tuesday. If there any issues we can't continue to use it then I will discontinue the use of the cast at that point. 01/08/19 on evaluation today patient actually appears to be doing very well as far as her foot ulcer specifically the great toe on the left is concerned. She did have an area of rubbing on the medial aspect of her left  ankle which again is from the cast. Fortunately there's no signs of infection at this point in this appears to be [name] very slight skin breakdown. The patient tells me she felt it rubbing but didn't think it was that bad. Fortunately there is no signs of active infection at this time which is good news. No fevers, chills, nausea, or vomiting noted at this time. 01/15/19 on evaluation today patient actually appears to be doing well in regard to her toe ulcer. Again as previous she seems to do well and she has the cast on which indicates to me that during the time she doesn't have [name] cast on she's putting way too much pressure on this region. Obviously I think that's gonna be an issue as with the current national emergency concerning the Covid-19 Virus it has been recommended that we discontinue the use of total contact casting by the chief medical officer of our company, Dr. Simona Huh. The reasoning is that if [name] patient becomes sick and cannot come into have the cast removed they could not just leave this on for an additional two weeks. Obviously the hospitals also do not want to receive patient's who are sick into the emergency department to potentially contaminate the region and spread the Covid-19 Virus among other sick individuals within the hospital system. Therefore at this point we are suspending the use of total contact cast until the current emergency subsides. This was all discussed with the patient today as well. 01/22/19 on evaluation today patient's wound on her left great toe appears to be doing slightly worse than previously noted last week. She tells me that she has been on this quite [name] bit in fact she tells me she's been awake for 38 straight hours. This is due to the fact that she's having to care for grandparents because nobody else will.  She has been taking care of them for five the last seven days since I've seen her they both have dementia his is from [name] stroke and her grandmother's was  progressive. Nonetheless she states even her mom who knows her condition and situation has only help two of those days to take care of them she's been taking care of the rest. Fortunately there does not appear to be any signs of active infection in regard to her toe at this point although obviously it doesn't look as good as it did previous. I think this is directly related to her not taking off the pressure and friction by way of taking things easy. Though I completely understand what's going on. LAIKYN, GEWIRTZ ([id number]) 01/29/19 on evaluation today patient's tools are actually appears to be showing some signs of improvement today compared to last week's evaluation as far as not necessarily the overall size of the wound but the fact that she has some new skin growth in between the two ends of the wound opening. Overall I feel like she has done well she states that she had [name] family member give her what sounds to be [name] CAM walker boot which has been helpful as well. 02/05/19 on evaluation today patient's wound bed actually appears to be doing significantly better in regard to her overall appearance of the size of the wound. With that being said she is still having an issue with offloading efficiently enough to get this to close. Apparently there is some signs of infection at this point as well unfortunately. Previously she's done well of Augmentin I really do not see anything that needs to be culture currently but there theme and cellulitis of the foot that I'm seeing I'm gonna go ahead and place her on an antibiotic today to try to help clear this up. 02/12/2019 on evaluation today patient actually appears to be doing poorly in regard to her overall wound status. She tells me she has been using her offloading shoe but actually comes in today wearing her tennis shoe with the AFO brace. Again as I previously discussed with her this is really not sufficient to allow the area to heal appropriately.  Nonetheless she continues to be somewhat noncompliant and I do wonder based on what she has told my nurse in the past as to whether or not [name] good portion of this noncompliance may be recreational drug and alcohol related. She has had [name] history of heroin overdose and this was fairly recently in the past couple of months that have been seeing her. Nonetheless overall I feel like her wound looks significantly worse today compared to what it was previous. She still has significant erythema despite the Augmentin I am not sure that this is an appropriate medication for her infection I am also concerned that the infection may have gone down into her bone. Patient History Information obtained from Patient. Social History Current every day smoker. Medical History Ear/Nose/Mouth/Throat Patient has history of Chronic sinus problems/congestion, Middle ear problems Endocrine Patient has history of Type II Diabetes Review of Systems (ROS) Constitutional Symptoms (General Health) Denies complaints or symptoms of Fatigue, Fever, Chills, Marked Weight Change. Respiratory Denies complaints or symptoms of Chronic or frequent coughs, Shortness of Breath. Cardiovascular Denies complaints or symptoms of Chest pain, LE edema. Psychiatric Denies complaints or symptoms of Anxiety, Claustrophobia. Objective Constitutional Well-nourished and well-hydrated in no acute distress. Vitals Time Taken: 9:20 AM, Height: 69 in, Weight: 240 lbs, BMI: 35.4, Temperature: 97.7 F,  Pulse: 92 bpm, Respiratory Rate: 16 breaths/min, Blood Pressure: 81/67 mmHg. LEEANNA, SLABY [name]. ([id number]) General Notes: Patient's BP is her normal. Respiratory normal breathing without difficulty. clear to auscultation bilaterally. Cardiovascular regular rate and rhythm with normal S1, S2. Psychiatric this patient is able to make decisions and demonstrates good insight into disease process. Alert and Oriented x 3. pleasant and  cooperative. General Notes: Patient's wound VAC currently showed significant necrotic tissue as well as callus over the surface of the wound. Subsequently I did perform debridement today in order to perform [name] good deep wound culture. However as I was debriding the wound it became noted that the patient had what appears to be nonviable bone and the proximal portion of the wound opening and I was actually able to obtain [name] scraping from the bone in order to send for culture as well. I really did not have enough to send for pathology however at this point. Nonetheless I do think that this likely may represent an osteomyelitis situation at this point. Integumentary (Hair, Skin) Wound #5 status is Open. Original cause of wound was Gradually Appeared. The wound is located on the Left Toe Great. The wound measures 3cm length x 2.2cm width x 0.2cm depth; 5.184cm^2 area and 1.037cm^3 volume. There is bone and Fat Layer (Subcutaneous Tissue) Exposed exposed. There is no tunneling or undermining noted. There is [name] medium amount of serous drainage noted. The wound margin is epibole. There is small (1-33%) pink, hyper - granulation within the wound bed. There is [name] large (67-100%) amount of necrotic tissue within the wound bed including Adherent Slough. The periwound skin appearance exhibited: Callus, Maceration, Rubor, Erythema. The periwound skin appearance did not exhibit: Crepitus, Excoriation, Induration, Rash, Scarring, Dry/Scaly, Atrophie Blanche, Cyanosis, Ecchymosis, Hemosiderin Staining, Mottled, Pallor. The surrounding wound skin color is noted with erythema which is circumferential. Periwound temperature was noted as Hot. General Notes: Patient arrives today wearing tennis shoes. Patient is fidgeting and seems disinterested in what is going on around her. Assessment Active Problems ICD-10 Type 2 diabetes mellitus with foot ulcer Non-pressure chronic ulcer of other part of left foot with fat layer  exposed Foot drop, left foot Procedures Wound #5 Pre-procedure diagnosis of Wound #5 is [name] Diabetic Wound/Ulcer of the Lower Extremity located on the Left Toe Great .Severity of Tissue Pre Debridement is: Bone involvement without necrosis. There was [name] Excisional Skin/Subcutaneous Tissue/Muscle/Bone Debridement with [name] total area of 6.6 sq cm performed by STONE III, Londell Noll E., PA-C. With the following instrument(s): Curette to remove Viable and Non-Viable tissue/material. Material removed includes Bone,Subcutaneous Oliva, Everleigh [name]. ([id number]) Tissue, and Slough after achieving pain control using Lidocaine. 1 specimen was taken by [name] Swab and Tissue Culture and sent to the lab per facility protocol. [name] time out was conducted at 09:40, prior to the start of the procedure. [name] Moderate amount of bleeding was controlled with Pressure. The procedure was tolerated well. Post Debridement Measurements: 3cm length x 2.2cm width x 0.4cm depth; 2.073cm^3 volume. Character of Wound/Ulcer Post Debridement requires further debridement. Severity of Tissue Post Debridement is: Bone involvement without necrosis. Post procedure Diagnosis Wound #5: Same as Pre-Procedure Plan Wound Cleansing: Wound #5 Left Toe Great: Clean wound with Normal Saline. Anesthetic (add to Medication List): Wound #5 Left Toe Great: Topical Lidocaine 4% cream applied to wound bed prior to debridement (In Clinic Only). Primary Wound Dressing: Wound #5 Left Toe Great: Silver Alginate Secondary Dressing: Wound #5 Left Toe Great: Conform/Kerlix Foam Dressing Change Frequency: Wound #  5 Left Toe Great: Three times weekly Follow-up Appointments: Wound #5 Left Toe Great: Return Appointment in 1 week. Off-Loading: Wound #5 Left Toe Great: Open toe surgical shoe to: - Wear your surgical shoe with your brace. No tennis Shoes! Additional Orders / Instructions: Wound #5 Left Toe Great: Increase protein intake. Medications-please add to  medication list.: Wound #5 Left Toe Great: P.O. Antibiotics Laboratory ordered were: Wound culture routine Radiology ordered were: X-ray, foot - 3-view Foot - Non healing wound on great toe. Currently the patient's wound is not looking well. I do think that we may want to initiate more specific treatment based on [name] wound culture and this was actually [name] bone culture that was obtained during the visit today. Subsequently also think that an x-ray of the foot would be warranted an MRI could be considered but again the patient has [name] hard time staying still I am not sure that she be able to do the MRI appropriately and to be honest I think the x-ray may give [place] sufficient information at least to begin the treatment process here. Subsequently we are going to see where things stand in 1 week at follow-up I explained to the patient that I think she is at very high risk currently of developing osteomyelitis if she does not already have it although my suspicion is that this is already osteomyelitis. We will see what the x-ray shows. In the meantime depend on the culture results I may change her antibiotics if the x-ray does indeed reveal evidence of osteomyelitis and also get [name] go ahead and see Casella, Areesha [name]. ([id number]) about setting her up for referral to infectious disease. That may be in [place] if there is no where in [facility] that she can go. The patient understands. I explained that it skin to be of utmost importance now more than ever that she needs to be as careful as possible from the standpoint of offloading. Obviously total contact casting is not good to be indicated at this point. She voiced understanding. I will have her continue with the Augmentin for the time being until I get the results of the bone culture back. If anything changes or worsens in the meantime the patient is instructed to go to the ER as soon as possible for further evaluation and treatment. This would  include developing fevers, chills, nausea, vomiting, or diarrhea. However it is not limited to those symptoms and this was also discussed. Electronic Signature(s) Signed: 02/15/2019 8:49:36 AM By: Worthy Keeler PA-C Entered By: Worthy Keeler on 02/12/2019 10:57:44 [name] ([id number]) -------------------------------------------------------------------------------- ROS/PFSH Details Patient Name: [name] Date of Service: 02/12/2019 9:15 AM Medical Record Number: [id number] Patient Account Number: [id number] Date of Birth/Sex: 1975-12-27 (43 y.o. F) Treating RN: Montey Hora Primary Care Provider: Priscille Kluver Other Clinician: Referring Provider: Priscille Kluver Treating Provider/Extender: STONE III, Suvan Stcyr Weeks in Treatment: 27 Information Obtained From Patient Constitutional Symptoms (General Health) Complaints and Symptoms: Negative for: Fatigue; Fever; Chills; Marked Weight Change Respiratory Complaints and Symptoms: Negative for: Chronic or frequent coughs; Shortness of Breath Cardiovascular Complaints and Symptoms: Negative for: Chest pain; LE edema Psychiatric Complaints and Symptoms: Negative for: Anxiety; Claustrophobia Ear/Nose/Mouth/Throat Medical History: Positive for: Chronic sinus problems/congestion; Middle ear problems Endocrine Medical History: Positive for: Type II Diabetes Time with diabetes: 10 years Treated with: Insulin, Oral agents Blood sugar tested every day: No Blood sugar testing results: Lunch: 350 HBO Extended History Items Ear/Nose/Mouth/Throat: Ear/Nose/Mouth/Throat: Chronic sinus problems/congestion Middle ear  problems Immunizations Pneumococcal Vaccine: Received Pneumococcal Vaccination: No Implantable Devices No devices added Cunanan, Idabell [name]. ([id number]) Family and Social History Current every day smoker Physician Affirmation I have reviewed and agree with the above information. Electronic Signature(s) Signed:  02/12/2019 2:55:40 PM By: Montey Hora Signed: 02/15/2019 8:49:36 AM By: Worthy Keeler PA-C Entered By: Worthy Keeler on 02/12/2019 10:52:56 [place], Tenishia AMarland Kitchen ([id number]) -------------------------------------------------------------------------------- SuperBill Details Patient Name: [name] Date of Service: 02/12/2019 Medical Record Number: [id number] Patient Account Number: [id number] Date of Birth/Sex: 06/13/1976 (43 y.o. F) Treating RN: Montey Hora Primary Care Provider: Priscille Kluver Other Clinician: Referring Provider: Priscille Kluver Treating Provider/Extender: Melburn Hake, Solmon Bohr Weeks in Treatment: 27 Diagnosis Coding ICD-10 Codes Code Description E11.621 Type 2 diabetes mellitus with foot ulcer L97.522 Non-pressure chronic ulcer of other part of left foot with fat layer exposed M21.372 Foot drop, left foot Facility Procedures CPT4 Code: 89842103 Description: 12811 - DEB BONE 20 SQ CM/< ICD-10 Diagnosis Description L97.522 Non-pressure chronic ulcer of other part of left foot with fa Modifier: t layer exposed Quantity: 1 Physician Procedures CPT4: Description Modifier Quantity Code [id number] Debridement; bone (includes epidermis, dermis, subQ tissue, muscle and/or fascia, if 1 performed) 1st 20 sqcm or less ICD-10 Diagnosis Description L97.522 Non-pressure chronic ulcer of other part of left  foot with fat layer exposed Electronic Signature(s) Signed: 02/15/2019 8:49:36 AM By: Worthy Keeler PA-C Entered By: Worthy Keeler on 02/12/2019 10:58:00

## 2019-02-19 ENCOUNTER — Encounter: Payer: Medicaid Other | Admitting: Physician Assistant

## 2019-02-19 ENCOUNTER — Other Ambulatory Visit: Payer: Self-pay

## 2019-02-19 DIAGNOSIS — E11622 Type 2 diabetes mellitus with other skin ulcer: Secondary | ICD-10-CM | POA: Diagnosis not present

## 2019-02-19 NOTE — Progress Notes (Signed)
DAMITA, EPPARD ([id number]) Visit Report for 02/19/2019 HBO Risk Assessment Details Patient Name: CLEVA, CAMERO A. Date of Service: 02/19/2019 11:15 AM Medical Record Number: [id number] Patient Account Number: [id number] Date of Birth/Sex: 1976/09/09 (43 y.o. F) Treating RN: Montey Hora Primary Care Coumba Kellison: Priscille Kluver Other Clinician: Referring Maely Clements: Priscille Kluver Treating Zykeem Bauserman/Extender: STONE III, HOYT Weeks in Treatment: 28 HBO Risk Assessment Items Answer Any "Yes" answers must be brought to the hyperbaric physician's attention. Breathing or Lung problemso No Currently use tobacco productso Yes Used tobacco products in the pasto Yes Heart problemso Yes Seen a doctor for any heart or blood pressure problemso Yes none - PCP - Priscille Kluver If yes, provide the name of doctor: [facility] Do you take water pills (diuretic)o No Diabeteso Yes On Diabetes pillo Yes If yes, last time taken: 02/19/2019 AM On Insulino Yes If yes, last time taken: 02/19/2019 Dialysiso No Eye problems like glaucomao No Ear problems or surgeryo No Sinus Problemso No Cancero No Confinement Anxiety (Claustrophobia- fear of confined places)o No Any medical implants/devices that are fully or partially implanted or attached to your No bodyo Pregnanto No Seizureso Yes Date of last known seizure: 11/24/2018 Date of last: EKG: 10/26/2017 CBC: 08/02/2018 Notes A1C 01/02/2019 10.0; no cxr since 2015 Electronic Signature(s) Signed: 02/19/2019 3:19:36 PM By: Joline Salt, Skylynne AMarland Kitchen ([id number]) Entered By: Montey Hora on 02/19/2019 12:06:02

## 2019-02-20 NOTE — Progress Notes (Signed)
BRENISHA, TSUI ([id number]) Visit Report for 02/19/2019 Arrival Information Details Patient Name: [name], [name] Date of Service: 02/19/2019 11:15 AM Medical Record Number: [id number] Patient Account Number: [id number] Date of Birth/Sex: 29-Jun-1976 (43 y.o. F) Treating RN: Harold Barban Primary Care Aayush Gelpi: Priscille Kluver Other Clinician: Referring Jame Morrell: Priscille Kluver Treating Anasophia Pecor/Extender: Melburn Hake, HOYT Weeks in Treatment: 28 Visit Information History Since Last Visit Added or deleted any medications: No Patient Arrived: Wheel Chair Any new allergies or adverse reactions: No Arrival Time: 11:26 Had a fall or experienced change in No activities of daily living that may affect Accompanied By: self risk of falls: Transfer Assistance: None Signs or symptoms of abuse/neglect since last visito No Patient Identification Verified: Yes Hospitalized since last visit: No Secondary Verification Process Completed: Yes Has Dressing in Place as Prescribed: Yes Patient Requires Transmission-Based No Pain Present Now: No Precautions: Patient Has Alerts: No Electronic Signature(s) Signed: 02/20/2019 4:45:14 PM By: Harold Barban Entered By: Harold Barban on 02/19/2019 11:27:09 [name] ([id number]) -------------------------------------------------------------------------------- Clinic Level of Care Assessment Details Patient Name: Heather Dear A. Date of Service: 02/19/2019 11:15 AM Medical Record Number: [id number] Patient Account Number: [id number] Date of Birth/Sex: January 27, 1976 (43 y.o. F) Treating RN: Montey Hora Primary Care Zykiria Bruening: Priscille Kluver Other Clinician: Referring Roseanne Juenger: Priscille Kluver Treating Sascha Baugher/Extender: STONE III, HOYT Weeks in Treatment: 28 Clinic Level of Care Assessment Items TOOL 4 Quantity Score []  - Use when only an EandM is performed on FOLLOW-UP visit 0 ASSESSMENTS - Nursing Assessment / Reassessment X - Reassessment of  Co-morbidities (includes updates in patient status) 1 10 X- 1 5 Reassessment of Adherence to Treatment Plan ASSESSMENTS - Wound and Skin Assessment / Reassessment X - Simple Wound Assessment / Reassessment - one wound 1 5 []  - 0 Complex Wound Assessment / Reassessment - multiple wounds []  - 0 Dermatologic / Skin Assessment (not related to wound area) ASSESSMENTS - Focused Assessment []  - Circumferential Edema Measurements - multi extremities 0 []  - 0 Nutritional Assessment / Counseling / Intervention X- 1 5 Lower Extremity Assessment (monofilament, tuning fork, pulses) []  - 0 Peripheral Arterial Disease Assessment (using hand held doppler) ASSESSMENTS - Ostomy and/or Continence Assessment and Care []  - Incontinence Assessment and Management 0 []  - 0 Ostomy Care Assessment and Management (repouching, etc.) PROCESS - Coordination of Care X - Simple Patient / Family Education for ongoing care 1 15 []  - 0 Complex (extensive) Patient / Family Education for ongoing care X- 1 10 Staff obtains Programmer, systems, Records, Test Results / Process Orders []  - 0 Staff telephones HHA, Nursing Homes / Clarify orders / etc []  - 0 Routine Transfer to another Facility (non-emergent condition) []  - 0 Routine Hospital Admission (non-emergent condition) []  - 0 New Admissions / Biomedical engineer / Ordering NPWT, Apligraf, etc. []  - 0 Emergency Hospital Admission (emergent condition) X- 1 10 Simple Discharge Coordination [name], [name] ([id number]) []  - 0 Complex (extensive) Discharge Coordination PROCESS - Special Needs []  - Pediatric / Minor Patient Management 0 []  - 0 Isolation Patient Management []  - 0 Hearing / Language / Visual special needs []  - 0 Assessment of Community assistance (transportation, D/C planning, etc.) []  - 0 Additional assistance / Altered mentation []  - 0 Support Surface(s) Assessment (bed, cushion, seat, etc.) INTERVENTIONS - Wound Cleansing / Measurement X -  Simple Wound Cleansing - one wound 1 5 []  - 0 Complex Wound Cleansing - multiple wounds X- 1 5 Wound Imaging (photographs - any number of wounds) []  - 0  Wound Tracing (instead of photographs) X- 1 5 Simple Wound Measurement - one wound []  - 0 Complex Wound Measurement - multiple wounds INTERVENTIONS - Wound Dressings X - Small Wound Dressing one or multiple wounds 1 10 []  - 0 Medium Wound Dressing one or multiple wounds []  - 0 Large Wound Dressing one or multiple wounds []  - 0 Application of Medications - topical []  - 0 Application of Medications - injection INTERVENTIONS - Miscellaneous []  - External ear exam 0 []  - 0 Specimen Collection (cultures, biopsies, blood, body fluids, etc.) []  - 0 Specimen(s) / Culture(s) sent or taken to Lab for analysis []  - 0 Patient Transfer (multiple staff / Civil Service fast streamer / Similar devices) []  - 0 Simple Staple / Suture removal (25 or less) []  - 0 Complex Staple / Suture removal (26 or more) []  - 0 Hypo / Hyperglycemic Management (close monitor of Blood Glucose) []  - 0 Ankle / Brachial Index (ABI) - do not check if billed separately X- 1 5 Vital Signs [name], [name] ([id number]) Has the patient been seen at the hospital within the last three years: Yes Total Score: 90 Level Of Care: New/Established - Level 3 Electronic Signature(s) Signed: 02/19/2019 3:19:36 PM By: Montey Hora Entered By: Montey Hora on 02/19/2019 11:59:50 [name] ([id number]) -------------------------------------------------------------------------------- Encounter Discharge Information Details Patient Name: Heather Dear A. Date of Service: 02/19/2019 11:15 AM Medical Record Number: [id number] Patient Account Number: [id number] Date of Birth/Sex: 19-Jan-1976 (43 y.o. F) Treating RN: Montey Hora Primary Care Abriana Saltos: Priscille Kluver Other Clinician: Referring Nazly Digilio: Priscille Kluver Treating Lavoris Canizales/Extender: Melburn Hake, HOYT Weeks in Treatment:  28 Encounter Discharge Information Items Discharge Condition: Stable Ambulatory Status: Wheelchair Discharge Destination: Home Transportation: Private Auto Accompanied By: family Schedule Follow-up Appointment: Yes Clinical Summary of Care: Electronic Signature(s) Signed: 02/19/2019 3:19:36 PM By: Montey Hora Entered By: Montey Hora on 02/19/2019 12:09:38 [name] ([id number]) -------------------------------------------------------------------------------- Lower Extremity Assessment Details Patient Name: Heather Dear A. Date of Service: 02/19/2019 11:15 AM Medical Record Number: [id number] Patient Account Number: [id number] Date of Birth/Sex: [birth year] (43 y.o. F) Treating RN: Harold Barban Primary Care Ezekial Arns: Priscille Kluver Other Clinician: Referring Greg Cratty: Priscille Kluver Treating Jaesean Litzau/Extender: STONE III, HOYT Weeks in Treatment: 28 Vascular Assessment Pulses: Dorsalis Pedis Palpable: [Left:Yes] Posterior Tibial Palpable: [Left:Yes] Electronic Signature(s) Signed: 02/20/2019 4:45:14 PM By: Harold Barban Entered By: Harold Barban on 02/19/2019 11:36:33 [name], [name] ([id number]) -------------------------------------------------------------------------------- Multi Wound Chart Details Patient Name: Heather Dear A. Date of Service: 02/19/2019 11:15 AM Medical Record Number: [id number] Patient Account Number: [id number] Date of Birth/Sex: January 16, 1976 (43 y.o. F) Treating RN: Montey Hora Primary Care Taleisha Kaczynski: Priscille Kluver Other Clinician: Referring Kemora Pinard: Priscille Kluver Treating Sharice Harriss/Extender: STONE III, HOYT Weeks in Treatment: 28 Vital Signs Height(in): 69 Pulse(bpm): 85 Weight(lbs): 240 Blood Pressure(mmHg): 95/66 Body Mass Index(BMI): 35 Temperature(F): 98.3 Respiratory Rate 18 (breaths/min): Photos: [N/A:N/A] Wound Location: Left Toe Great N/A N/A Wounding Event: Gradually Appeared N/A N/A Primary Etiology: Diabetic  Wound/Ulcer of the N/A N/A Lower Extremity Comorbid History: Chronic sinus N/A N/A problems/congestion, Middle ear problems, Type II Diabetes Date Acquired: 11/16/2018 N/A N/A Weeks of Treatment: 12 N/A N/A Wound Status: Open N/A N/A Measurements L x W x D 3x1x0.4 N/A N/A (cm) Area (cm) : 2.356 N/A N/A Volume (cm) : 0.942 N/A N/A % Reduction in Area: -499.50% N/A N/A % Reduction in Volume: -500.00% N/A N/A Starting Position 1 10 (o'clock): Ending Position 1 5 (o'clock): Maximum Distance 1 (cm): 0.5 Undermining: Yes N/A N/A Classification: Grade 3  N/A N/A Exudate Amount: Medium N/A N/A Exudate Type: Serous N/A N/A Exudate Color: amber N/A N/A Wound Margin: Epibole N/A N/A Granulation Amount: Small (1-33%) N/A N/A Granulation Quality: Pink, Hyper-granulation N/A N/A [name], [name] ([id number]) Necrotic Amount: Large (67-100%) N/A N/A Exposed Structures: Fat Layer (Subcutaneous N/A N/A Tissue) Exposed: Yes Bone: Yes Fascia: No Tendon: No Muscle: No Joint: No Epithelialization: None N/A N/A Periwound Skin Texture: Callus: Yes N/A N/A Excoriation: No Induration: No Crepitus: No Rash: No Scarring: No Periwound Skin Moisture: Maceration: Yes N/A N/A Dry/Scaly: No Periwound Skin Color: Erythema: Yes N/A N/A Rubor: Yes Atrophie Blanche: No Cyanosis: No Ecchymosis: No Hemosiderin Staining: No Mottled: No Pallor: No Erythema Location: Circumferential N/A N/A Temperature: Hot N/A N/A Tenderness on Palpation: No N/A N/A Treatment Notes Electronic Signature(s) Signed: 02/19/2019 3:19:36 PM By: Montey Hora Entered By: Montey Hora on 02/19/2019 11:55:49 [name] ([id number]) -------------------------------------------------------------------------------- [place] Details Patient Name: Heather Dear A. Date of Service: 02/19/2019 11:15 AM Medical Record Number: [id number] Patient Account Number: [id number] Date of Birth/Sex:  10/18/1976 (43 y.o. F) Treating RN: Montey Hora Primary Care Heman Que: Priscille Kluver Other Clinician: Referring Alphus Zeck: Priscille Kluver Treating Zane Samson/Extender: Melburn Hake, HOYT Weeks in Treatment: 28 Active Inactive Abuse / Safety / Falls / Self Care Management Nursing Diagnoses: History of Falls Goals: Patient will not experience any injury related to falls Date Initiated: 08/07/2018 Target Resolution Date: 10/19/2018 Goal Status: Active Interventions: Assess fall risk on admission and as needed Notes: Orientation to the Wound Care Program Nursing Diagnoses: Knowledge deficit related to the wound healing center program Goals: Patient/caregiver will verbalize understanding of the [facility] Program Date Initiated: 08/07/2018 Target Resolution Date: 10/19/2018 Goal Status: Active Interventions: Provide education on orientation to the wound center Notes: Soft Tissue Infection Nursing Diagnoses: Impaired tissue integrity Goals: Patient will remain free of wound infection Date Initiated: 08/07/2018 Target Resolution Date: 10/19/2018 Goal Status: Active Interventions: Assess signs and symptoms of infection every visit [name], [name] ([id number]) Notes: Wound/Skin Impairment Nursing Diagnoses: Impaired tissue integrity Goals: Ulcer/skin breakdown will heal within 14 weeks Date Initiated: 08/07/2018 Target Resolution Date: 10/19/2018 Goal Status: Active Interventions: Assess patient/caregiver ability to obtain necessary supplies Notes: Electronic Signature(s) Signed: 02/19/2019 3:19:36 PM By: Montey Hora Entered By: Montey Hora on 02/19/2019 11:55:40 [name], [name] ([id number]) -------------------------------------------------------------------------------- Pain Assessment Details Patient Name: Heather Dear A. Date of Service: 02/19/2019 11:15 AM Medical Record Number: [id number] Patient Account Number: [id number] Date of Birth/Sex:  10-08-1976 (43 y.o. F) Treating RN: Harold Barban Primary Care Montoya Brandel: Priscille Kluver Other Clinician: Referring Qiana Landgrebe: Priscille Kluver Treating Konner Saiz/Extender: STONE III, HOYT Weeks in Treatment: 28 Active Problems Location of Pain Severity and Description of Pain Patient Has Paino No Site Locations Pain Management and Medication Current Pain Management: Electronic Signature(s) Signed: 02/20/2019 4:45:14 PM By: Harold Barban Entered By: Harold Barban on 02/19/2019 11:27:15 [name] ([id number]) -------------------------------------------------------------------------------- Patient/Caregiver Education Details Patient Name: Heather Dear A. Date of Service: 02/19/2019 11:15 AM Medical Record Number: [id number] Patient Account Number: [id number] Date of Birth/Gender: [birth year] (43 y.o. F) Treating RN: Montey Hora Primary Care Physician: Priscille Kluver Other Clinician: Referring Physician: Priscille Kluver Treating Physician/Extender: Sharalyn Ink in Treatment: 71 Education Assessment Education Provided To: Patient Education Topics Provided Wound/Skin Impairment: Handouts: Caring for Your Ulcer, Other: wound care as ordered Methods: Demonstration, Explain/Verbal Responses: State content correctly Electronic Signature(s) Signed: 02/19/2019 3:19:36 PM By: Montey Hora Entered By: Montey Hora on 02/19/2019 12:08:30 [name], [name] ([id number]) -------------------------------------------------------------------------------- Wound  Assessment Details Patient Name: [name], [name] Date of Service: 02/19/2019 11:15 AM Medical Record Number: [id number] Patient Account Number: [id number] Date of Birth/Sex: 10-19-1976 (43 y.o. F) Treating RN: Harold Barban Primary Care Leeland Lovelady: Priscille Kluver Other Clinician: Referring Esaiah Wanless: Priscille Kluver Treating Ysela Hettinger/Extender: STONE III, HOYT Weeks in Treatment: 28 Wound Status Wound Number: 5 Primary Diabetic  Wound/Ulcer of the Lower Extremity Etiology: Wound Location: Left Toe Great Wound Open Wounding Event: Gradually Appeared Status: Date Acquired: 11/16/2018 Comorbid Chronic sinus problems/congestion, Middle ear Weeks Of Treatment: 12 History: problems, Type II Diabetes Clustered Wound: No Photos Wound Measurements Length: (cm) 3 % Reduction Width: (cm) 1 % Reduction Depth: (cm) 0.4 Epitheliali Area: (cm) 2.356 Tunneling: Volume: (cm) 0.942 Underminin Starting Ending P Maximum in Area: -499.5% in Volume: -500% zation: None No g: Yes Position (o'clock): 10 osition (o'clock): 5 Distance: (cm) 0.5 Wound Description Classification: Grade 3 Foul Odor Wound Margin: Epibole Slough/Fib Exudate Amount: Medium Exudate Type: Serous Exudate Color: amber After Cleansing: No rino Yes Wound Bed Granulation Amount: Small (1-33%) Exposed Structure Granulation Quality: Pink, Hyper-granulation Fascia Exposed: No Necrotic Amount: Large (67-100%) Fat Layer (Subcutaneous Tissue) Exposed: Yes Necrotic Quality: Adherent Slough Tendon Exposed: No Muscle Exposed: No Joint Exposed: No [name], [name] ([id number]) Bone Exposed: Yes Periwound Skin Texture Texture Color No Abnormalities Noted: No No Abnormalities Noted: No Callus: Yes Atrophie Blanche: No Crepitus: No Cyanosis: No Excoriation: No Ecchymosis: No Induration: No Erythema: Yes Rash: No Erythema Location: Circumferential Scarring: No Hemosiderin Staining: No Mottled: No Moisture Pallor: No No Abnormalities Noted: No Rubor: Yes Dry / Scaly: No Maceration: Yes Temperature / Pain Temperature: Hot Treatment Notes Wound #5 (Left Toe Great) Notes silver alginate, foam, conform, medipore tape. Electronic Signature(s) Signed: 02/20/2019 4:45:14 PM By: Harold Barban Entered By: Harold Barban on 02/19/2019 11:36:10 [name]  ([id number]) -------------------------------------------------------------------------------- [name] Details Patient Name: Heather Dear A. Date of Service: 02/19/2019 11:15 AM Medical Record Number: [id number] Patient Account Number: [id number] Date of Birth/Sex: Jan 21, 1976 (43 y.o. F) Treating RN: Harold Barban Primary Care Tashara Suder: Priscille Kluver Other Clinician: Referring Helio Lack: Priscille Kluver Treating Jervis Trapani/Extender: STONE III, HOYT Weeks in Treatment: 28 Vital Signs Time Taken: 11:27 Temperature (F): 98.3 Height (in): 69 Pulse (bpm): 85 Weight (lbs): 240 Respiratory Rate (breaths/min): 18 Body Mass Index (BMI): 35.4 Blood Pressure (mmHg): 95/66 Reference Range: 80 - 120 mg / dl Electronic Signature(s) Signed: 02/20/2019 4:45:14 PM By: Harold Barban Entered By: Harold Barban on 02/19/2019 11:29:29

## 2019-02-20 NOTE — Progress Notes (Signed)
DONASIA, WIMES ([id number]) Visit Report for 02/19/2019 Chief Complaint Document Details Patient Name: [name], [name] Date of Service: 02/19/2019 11:15 AM Medical Record Number: [id number] Patient Account Number: [id number] Date of Birth/Sex: 05-Feb-1976 (43 y.o. F) Treating RN: Montey Hora Primary Care Provider: Priscille Kluver Other Clinician: Referring Provider: Priscille Kluver Treating Provider/Extender: STONE III, Gaelle Adriance Weeks in Treatment: 28 Information Obtained from: Patient Chief Complaint Left great toe ulcer Electronic Signature(s) Signed: 02/20/2019 7:59:38 AM By: Worthy Keeler PA-C Entered By: Worthy Keeler on 02/19/2019 11:49:57 [place], [place] ([id number]) -------------------------------------------------------------------------------- HPI Details Patient Name: Heather Dear A. Date of Service: 02/19/2019 11:15 AM Medical Record Number: [id number] Patient Account Number: [id number] Date of Birth/Sex: 04/25/1976 (43 y.o. F) Treating RN: Montey Hora Primary Care Provider: Priscille Kluver Other Clinician: Referring Provider: Priscille Kluver Treating Provider/Extender: Melburn Hake, Genova Kiner Weeks in Treatment: 28 History of Present Illness HPI Description: 01/18/18-She is here for initial evaluation of the left great toe ulcer. She is a poor historian in regards to timeframe in detail. She states approximately 4 weeks ago she lacerated her toe on something in the house. She followed up with her primary care who placed her on Bactrim and ultimately a second dose of Bactrim prior to coming to wound clinic. She states she has been treating the toe with peroxide, Betadine and a Band-Aid. She did not check her blood sugar this morning but checked it yesterday morning it was 327; she is unaware of a recent A1c and there are no current records. She saw Dr. she would've orthopedics last week for an old injury to the left ankle, she states he did not see her toe, nor did she bring it to his  attention. She smokes approximately 1 pack cigarettes a day. Her social situation is concerning, she arrives this morning with her mother who appears extremely intoxicated/under the influence; her mother was asked to leave the room and be monitored by the patient's grandmother. The patient's aunt then accompanied the patient and the room throughout the rest of the appointment. We had a lengthy discussion regarding the deleterious effects of uncontrolled hyperglycemia and smoking as it relates to wound healing and overall health. She was strongly encouraged to decrease her smoking and get her diabetes under better control. She states she is currently on a diet and has cut down her [facility] consumption. The left toe is erythematous, macerated and slightly edematous with malodor present. The edema in her left foot is below her baseline, there is no erythema streaking. We will treat her with Santyl, doxycycline; we have ordered and xray, culture and provided a Peg assist surgical shoe and cultured the wound. 01/25/18-She is here in follow-up evaluation for a left great toe ulcer and presents with an abscess to her suprapubic area. She states her blood sugars remain elevated, feeling "sick" and if levels are below 250, but she is trying. She has made no attempt to decrease her smoking stating that we "can't take away her food in her cigarettes". She has been compliant with offloading using the PEG assist you. She is using Santyl daily. the culture obtained last week grew staph aureus and Enterococcus faecalis; continues on the doxycycline and Augmentin was added on Monday. The suprapubic area has erythema, no femoral variation, purple discoloration, minimal induration, was accessed with a cotton tip applicator with sanguinopurulent drainage, this was cultured, I suspect the current antibiotic treatment will cover and we will not add anything to her current treatment plan. She was advised  to go to urgent  care or ER with any change in redness, induration or fever. 02/01/18-She is here in follow-up evaluation for left great toe ulcers and a new abdominal abscess from last week. She was able to use packing until earlier this week, where she "forgot it was there". She states she was feeling ill with GI symptoms last week and was not taking her antibiotic. She states her glucose levels have been predominantly less than 200, with occasional levels between 200-250. She thinks this was contributing to her GI symptoms as they have resolved without intervention. There continues to be significant laceration to left toe, otherwise it clinically looks stable/improved. There is now less superficial opening to the lateral aspect of the great toe that was residual blister. We will transition to [facility] to all wounds, she will continue her Augmentin. If there is no change or deterioration next week for reculture. 02/08/18-She is here in follow-up evaluation for left great toe ulcer and abdominal ulcer. There is an improvement in both wounds. She has been wrapping her left toe with coban, not by our direction, which has created an area of discoloration to the medial aspect; she has been advised to NOT use coban secondary to her neuropathy. She states her glucose levels have been high over this last week ranging from 200-350, she continues to smoke. She admits to being less compliant with her offloading shoe. We will continue with same treatment plan and she will follow-up next week. 02/15/18-She is here in follow-up evaluation for left great toe ulcer and abdominal ulcer. The abdominal ulcer is epithelialized. The left great toe ulcer is improved and all injury from last week using the Coban wrap is resolved, the lateral ulcer is healed. She admits to noncompliance with wearing offloading shoe and admits to glucose levels being greater than 300 most of the week. She continues to smoke and expresses no desire to  quit. There is one area medially that probes deeper than it has historically, erythema to the toe and dorsal foot has consistently waxed and waned. There is no overt signs of cellulitis or infection but we will culture the wound for any occult infection given the new area of depth and erythema. We will hold off on sensitivities for initiation of antibiotic therapy. 02/22/18-She is here in follow up evaluation for left great toe ulcer. There is overall significant improvement in both wound appearance, erythema and edema with changes made last week. She was not initiated on antibiotic therapy. Culture obtained last week showed oxacillin sensitive staph aureus, sensitive to clindamycin. Clindamycin has been called into the pharmacy but she has been instructed to hold off on initiation secondary to overall clinical improvement and her history of antibiotic intolerance. She has been instructed to contact the clinic with any noted changes/deterioration and the wound, erythema, Murillo, Renay A. ([id number]) edema and/or pain. She will follow-up next week. She continues to smoke and her glucose levels remain elevated >250; she admits to compliance with offloading shoe 03/01/18 on evaluation today patient appears to be doing fairly well in regard to her left first toe ulcer. She has been tolerating the dressing changes with the [facility] Dressing without complication and overall this has definitely showed signs of improvement according to records as well is what the patient tells me today. I'm very pleased in that regard. She is having no pain today 03/08/18 She is here for follow up evaluation of a left great toe ulcer. She remains non-compliant with glucose control  and smoking cessation; glucose levels consistently >200. She states that she got new shoe inserts/peg assist. She admits to compliance with offloading. Since my last evaluation there is significant improvement. We will switch to prisma at this  time and she will follow up next week. She is noted to be tachycardic at this appointment, heart rate 120s; she has a history of heart rate 70-130 according to our records. She admits to extreme agitation r/t personal issues; she was advised to monitor her heartrate and contact her physician if it does not return to a more normal range (<100). She takes cardizem twice daily. 03/15/18-She is here in follow-up evaluation for left great toe ulcer. She remains noncompliant with glucose control and smoking cessation. She admits to compliance with wearing offloading shoe. The ulcer is improved/stable and we will continue with the same treatment plan and she will follow-up next week 03/22/18-She is here for evaluation for left great toe ulcer. There continues to be significant improvement despite recurrent hyperglycemia (over 500 yesterday) and she continues to smoke. She has been compliant with offloading and we will continue with same treatment plan and she will follow-up next week. 03/29/18-She is here for evaluation for left great toe ulcer. Despite continuing to smoke and uncontrolled diabetes she continues to improve. She is compliant with offloading shoe. We will continue with the same treatment plan and she will follow-up next week 04/05/18- She is here in follow up evaluation for a left great toe ulcer; she presents with small pustule to left fifth toe (resembles ant bite). She admits to compliance with wearing offloading shoe; continues to smoke or have uncontrolled blood glucose control. There is more callus than usual with evidence of bleeding; she denies known trauma. 04/12/18-She is here for evaluation of left great toe ulcer. Despite noncompliance with glycemic control and smoking she continues to make improvement. She continues to wear offloading shoe. The pustule, that was identified last week, to the left fifth toe is resolved. She will follow-up in 2 weeks 05/03/18-she is seen in follow-up  evaluation for a left great toe ulcer. She is compliant with offloading, otherwise noncompliant with glycemic control and smoking. She has plateaued and there is minimal improvement noted. We will transition to [facility], replaced the insert to her surgical shoe and she will follow-up in one week 05/10/18- She is here in follow up evaluation for a left great toe ulcer. It appears stable despite measurement change. We will continue with same treatment plan and follow up next week. 05/24/18-She is seen in follow-up evaluation for a left great toe ulcer. She remains compliant with offloading, has made significant improvement in her diet, decreasing the amount of sugar/soda. She said her recent A1c was 10.9 which is lower than. She did see a diabetic nutritionist/educator yesterday. She continues to smoke. We will continue with the same treatment plan and she'll follow-up next week. 05/31/18- She is seen in follow-up evaluation for left great toe ulcer. She continues to remain compliant with offloading, continues to make improvement in her diet, increasing her water and decreasing the amount of sugar/soda. She does continue to smoke with no desire to quit. We will apply Prisma to the depth and Hydrofera Blue over. We have not received insurance authorization for oasis. She will follow up next week. 06/07/18-She is seen in follow-up evaluation for left great toe ulcer. It has stalled according to today's measurements although base appears stable. She says she saw a diabetic educator yesterday; her average blood sugars are  less than 300 which is an improvement for her. She continues to smoke and states "that's my next step" She continues with water over soda. We will order for xray, culture and reinstate ace wrap compression prior to placing apligraf for next week. She is voicing no complaints or concerns. Her dressing will change to iodoflex over the next week in preparation for apligraf. 06/14/18-She is  seen in follow-up evaluation for left great toe ulcer. Plain film x-ray performed last week was negative for osteomyelitis. Wound culture obtained last week grew strep B and OSSA; she is initiated on keflex and cefdinir today; there is erythema to the toe which could be from ace wrap compression, she has a history of wrapping too tight and has has been encouraged to maintain ace wraps that we place today. We will hold off on application of apligraf today, will apply next week after antibiotic therapy has been initiated. She admits today that she has resumed taking a shower with her foot/toe submerged in water, she has been reminded to keep foot/toe out of the bath water. She will be seen in follow up next week 06/21/18-she is seen in follow-up evaluation for left great toe ulcer. She is tolerating antibiotic therapy with no GI disturbance. The wound is stable. Apligraf was applied today. She has been decreasing her smoking, only had 4 cigarettes yesterday and 1 today. She continues being more compliant in diabetic diet. She will follow-up next week for evaluation of site, if stable will remove at 2 weeks. 06/28/18- She is here in follow up evalution. Apligraf was placed last week, she states the dressing fell off on Tuesday and she was dressing with hydrofera blue. She is healed and will be discharged from the clinic today. She has been instructed to continue with smoking cessation, continue monitoring glucose levels, offloading for an additional 4 weeks and continue with Lembo, Cobi A. ([id number]) hydrofera blue for additional two weeks for any possible microscopic opening. Readmission: 08/07/18 on evaluation today patient presents for reevaluation concerning the ulcer of her right great toe. She was previously discharged on 06/28/18 healed. Nonetheless she states that this began to show signs of drainage she subsequently went to her primary care provider. Subsequently an x-ray was performed on  08/01/18 which was negative. The patient was also placed on antibiotics at that time. Fortunately they should have been effective for the infection. Nonetheless she's been experiencing some improvement but still has a lot of drainage coming from the wound itself. 08/14/18 on evaluation today patient's wound actually does show signs of improvement in regard to the erythema at this point. She has completed the antibiotics. With that being said we did discuss the possibility of placing her in a total contact cast as of today although I think that I may want to give this just a little bit more time to ensure nothing recurrence as far as her infection is concerned. I do not want to put in the cast and risk infection at that time if things are not completely resolved. With that being said she is gonna require some debridement today. 08/21/18 on evaluation today patient actually appears to be doing okay in regard to her toe ulcer. She's been tolerating the dressing changes without complication. With that being said it does appear that she is ready and in fact I think it's appropriate for [place] to go ahead and initiate the total contact cast today. Nonetheless she will require some sharp debridement to prepare the wound for application. Overall  I feel like things have been progressing well but we do need to do something to get this to close more readily. 08/24/18 patient seen today for reevaluation after having had the total contact cast applied on Tuesday. She seems to have done very well the wound appears to be doing great and overall I'm pleased with the progress that she's made. There were no abnormal areas of rubbing from the cast on her lower extremity. 08/30/18 on evaluation today patient actually appears to be completely healed in regard to her plantar toe ulcer. She tells me at this point she's been having a lot of issues with the cast. She almost fell a couple of times the state shall the step of her  dog a couple times as well. This is been a very frustrating process for her other nonetheless she has completely healed the wound which is excellent news. Overall there does not appear to be the evidence of infection at this time which is great news. 09/11/18 evaluation today patient presents for follow-up concerning her great toe ulcer on the left which has unfortunately reopened since I last saw her which was only a couple of weeks ago. Unfortunately she was not able to get in to get the shoe and potentially the AFO that's gonna be necessary due to her left foot drop. She continues with offloading shoe but this is not enough to prevent her from reopening it appears. When we last had her in the total contact cast she did well from a healing standpoint but unfortunately the wound reopened as soon as she came out of the cast within just a couple of weeks. 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 This is also part of the reason why she falls on a regular basis. I just do not believe that is gonna be safe for [place] to reinitiate the total contact cast as last time we had this on she fell 3 times one week which is definitely not normal for her. 09/18/18 upon evaluation today the patient actually appears to be doing about the same in regard to her toe ulcer. She did not contact [facility] as I asked her to even though I had given her the prescription. In fact she actually states that she has no idea where the prescription is. She did apparently call [facility] and they told her that all she needed to do was bring the prescription in order to be able to be seen and work on getting the AFO for her left foot. With all that being said she still does not have an appointment and I'm not sure were things stand that regard.  I will give her a new prescription today in order to contact them to get this set up. 09/25/18 on evaluation today patient actually appears to be doing about the same in regard to her toes ulcer. She does have a small areas which seems to have a lot of callous buildup around the edge of the wound which is going to need sharp debridement today. She still is waiting to be scheduled for evaluation with [facility] for possibility of an AFO. She states there supposed to call her tomorrow to get this set up. Unfortunately it does appear that her foot specifically the toe area is showing signs  of erythema. There does not appear to be any systemic infection which is in these good news. 10/02/18 on evaluation today patient actually appears to be doing about the same in regard to her toe ulcer. This really has not done too well although it's not significantly larger it's also not significantly smaller. She has been tolerating the dressing changes without complication. She actually has her appointment with [facility] and [facility] tomorrow to hopefully be measured for obtaining and AFO splint. I think this would be helpful preventing this from reoccurring. We had contemplated starting the cast this week although to be honest I am reluctant to do that as she's been having nausea, vomiting, and seizure activity over the past three days. She has a history of seizures and have been told is nothing that can be done for these. With Stanczyk, Raeden A. ([id number]) that being said I do believe that along with the seizures have the nausea vomiting which upon further questioning doesn't seem to be the normal for her and makes me concerned for the possibility of infection or something else going on. I discussed this with the patient and her mother during the office visit today. I do not feel the wound is effective but maybe something else. The responses this was "this just happens to her at times and we don't know why". They did  not seem to be interested in going to the hospital to have this checked out further. 10/09/18 on evaluation today patient presents for follow-up concerning her ongoing toe ulcer. She has been tolerating the dressing changes without complication. Fortunately there does not appear to be any evidence of infection which is great news however I do think that the patient would benefit from going ahead for with the total contact cast. She's actually in a wheelchair today she tells me that she will use her walker if we initiate the cast. I was very specific about the fact that if we were gonna do the cast I wanted to make sure that she was using the walker in order to prevent any falls. She tells me she does not have stairs that she has to traverse on a regular basis at her home. She has not had any seizures since last week again that something that happens to her often she tells me she did talk to [employer] and they said that it may take up to three weeks to get the brace approved for her. Hopefully that will not take that long but nonetheless in the meantime I do think the cast could be of benefit. 10/12/18 on evaluation today patient appears to be doing rather well in regard to her toe ulcer. It's just been a few days and already this is significantly improved both as far as overall appearance and size. Fortunately there's no sign of infection. She is here for her first obligatory cast change. 10/19/18 Seen today for follow up and management of left great toe ulcer. Wound continues to show improvement. Noted small open area with seroussang drainage with palpation. Denies any increased pain or recent fevers during visit. She will continue calcium alginate with offloading shoe. Denies any questions or concerns during visit. 10/26/18 on evaluation today patient appears to be doing about the same as when I last saw her in regard to her wound bed. Fortunately there does not appear to be any signs of infection.  Unfortunately she continues to have a breakdown in regard to the toe region any time that she is not in the cast.  It takes almost no time at all for this to happen. Nonetheless she still has not heard anything from the brace being made by [employer] as to when exactly this will be available to her. Fortunately there is no signs of infection at this time. 10/30/18 on evaluation today patient presents for application of the total contact cast as we just received him this morning. Fortunately we are gonna be able to apply this to her today which is great news. She continues to have no significant pain which is good news. Overall I do feel like things have been improving while she was the cast is when she doesn't have a cast that things get worse. She still has not really heard anything from [place] regarding her brace. 11/02/18 upon evaluation today patient's wound already appears to be doing significantly better which is good news. Fortunately there does not appear to be any signs of infection also good news. Overall I do think the total contact cast as before is helping to heal this area unfortunately it's just not gonna likely keep the area closed and healed without her getting her brace at least. Again the foot drop is a significant issue for her. 11/09/18 on evaluation today patient appears to be doing excellent in regard to her toe ulcer which in fact is completely healed. Fortunately we finally got the situation squared away with the paperwork which was needed to proceed with getting her brace approved by Medicaid. I have filled that out unfortunately that information has been sent to the orthopedic office that I worked at 2 1/2 years ago and not tired Current wound care measures. Fortunately she seems to be doing very well at this time. 11/23/18 on evaluation today patient appears to be doing More Poorly Compared to Last Time I Saw Her. At [facility] She Had Completely Healed. Currently she is  continuing to have issues with reopening. She states that she just found out that the brace was approved through Medicaid now she just has to go get measured in order to have this fitted for her and then made. Subsequently she does not have an appointment for this yet that is going to complicate things we obviously cannot put her back in the cast if we do not have everything measured because they're not gonna be able to measure her foot while she is in the cast. 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 Obviously this is unfortunate and does have me somewhat worried as well. 11/30/18 on evaluation today patient's toe ulcer actually appears to be doing fairly well. The good news is she will be getting her brace in the shoes next week on Wednesday. Hopefully we will be able to get this to heal without having to go back in the cast however she may need the cast in order to get the wound completely heal and then go from there. Fortunately there's no signs of infection at this time. 12/07/18 on evaluation today patient fortunately did receive her brace and she states she could tell this definitely makes her walk better. With that being said she's been having issues with her toe where she noticed yesterday there was a lot of tissue Pillard, Raeshawn A. ([id number]) that was loosing off this appears to be much larger than what it was previous. She also states that her leg has been read putting much across the top of  her foot just about the ankle although this seems to be receiving somewhat. The total area is still red and appears to be someone infected as best I can tell. She is previously taken Bactrim and that may be a good option for her today as well. We are gonna see what I wound culture shows as well and I think that this is definitely appropriate. With that being said outside of the culture I still need to  initiate something in the interim and that's what I'm gonna go ahead and select Bactrim is a good option for her. 12/14/18 on evaluation today patient appears to be doing better in regard to her left great toe ulcer as compared to last week's evaluation. There's still some erythema although this is significantly improved which is excellent news. Overall I do believe that she is making good progress is still gonna take some time before she is where I would like her to be from the standpoint of being able to place her back into the total contact cast. Hopefully we will be where we need to be by next week. 12/21/18 on evaluation today patient actually appears to be doing poorly in regard to her toe ulcer. She's been tolerating the dressing changes without complication. Fortunately there's no signs of systemic infection although she does have a lot of drainage from the toe ulcer and this does seem to be causing some issues at this point. She does have erythema on the distal portion of her toe that appears to be likely cellulitis. 12/28/18 on evaluation today patient actually appears to be doing a little better in my pinion in regard to her toe ulcer. With that being said she still does have some evidence of infection at this time and for her culture she had both E. coli as well as enterococcus as organisms noted on evaluation. For that reason I think that though the Keflex likely has treated the E. coli rather well this has really done nothing for the enterococcus. We are going to have to initiate treatment for this specifically. 01/04/19 on evaluation today patient's toe actually appears to be doing better from the standpoint of infection. She currently would like to see about putting the cash back on I think that this is appropriate as long as she takes care of it and keeps it from getting wet. She is gonna have some drainage we can definitely pass this up with Drawtex and alginate to try to prevent as  much drainage as possible from causing the problems. With that being said I do want to at least try her with the cast between now and Tuesday. If there any issues we can't continue to use it then I will discontinue the use of the cast at that point. 01/08/19 on evaluation today patient actually appears to be doing very well as far as her foot ulcer specifically the great toe on the left is concerned. She did have an area of rubbing on the medial aspect of her left ankle which again is from the cast. Fortunately there's no signs of infection at this point in this appears to be a very slight skin breakdown. The patient tells me she felt it rubbing but didn't think it was that bad. Fortunately there is no signs of active infection at this time which is good news. No fevers, chills, nausea, or vomiting noted at this time. 01/15/19 on evaluation today patient actually appears to be doing well in regard to her toe ulcer. Again as  previous she seems to do well and she has the cast on which indicates to me that during the time she doesn't have a cast on she's putting way too much pressure on this region. Obviously I think that's gonna be an issue as with the current national emergency concerning the Covid-19 Virus it has been recommended that we discontinue the use of total contact casting by the chief medical officer of our company, Dr. Simona Huh. The reasoning is that if a patient becomes sick and cannot come into have the cast removed they could not just leave this on for an additional two weeks. Obviously the hospitals also do not want to receive patient's who are sick into the emergency department to potentially contaminate the region and spread the Covid-19 Virus among other sick individuals within the hospital system. Therefore at this point we are suspending the use of total contact cast until the current emergency subsides. This was all discussed with the patient today as well. 01/22/19 on evaluation  today patient's wound on her left great toe appears to be doing slightly worse than previously noted last week. She tells me that she has been on this quite a bit in fact she tells me she's been awake for 38 straight hours. This is due to the fact that she's having to care for grandparents because nobody else will. She has been taking care of them for five the last seven days since I've seen her they both have dementia his is from a stroke and her grandmother's was progressive. Nonetheless she states even her mom who knows her condition and situation has only help two of those days to take care of them she's been taking care of the rest. Fortunately there does not appear to be any signs of active infection in regard to her toe at this point although obviously it doesn't look as good as it did previous. I think this is directly related to her not taking off the pressure and friction by way of taking things easy. Though I completely understand what's going on. 01/29/19 on evaluation today patient's tools are actually appears to be showing some signs of improvement today compared to last week's evaluation as far as not necessarily the overall size of the wound but the fact that she has some new skin growth in between the two ends of the wound opening. Overall I feel like she has done well she states that she had a family member give her what sounds to be a CAM walker boot which has been helpful as well. 02/05/19 on evaluation today patient's wound bed actually appears to be doing significantly better in regard to her overall Fraley, Jahayra A. ([id number]) appearance of the size of the wound. With that being said she is still having an issue with offloading efficiently enough to get this to close. Apparently there is some signs of infection at this point as well unfortunately. Previously she's done well of Augmentin I really do not see anything that needs to be culture currently but there theme and  cellulitis of the foot that I'm seeing I'm gonna go ahead and place her on an antibiotic today to try to help clear this up. 02/12/2019 on evaluation today patient actually appears to be doing poorly in regard to her overall wound status. She tells me she has been using her offloading shoe but actually comes in today wearing her tennis shoe with the AFO brace. Again as I previously discussed with her this is really not sufficient  to allow the area to heal appropriately. Nonetheless she continues to be somewhat noncompliant and I do wonder based on what she has told my nurse in the past as to whether or not a good portion of this noncompliance may be recreational drug and alcohol related. She has had a history of heroin overdose and this was fairly recently in the past couple of months that have been seeing her. Nonetheless overall I feel like her wound looks significantly worse today compared to what it was previous. She still has significant erythema despite the Augmentin I am not sure that this is an appropriate medication for her infection I am also concerned that the infection may have gone down into her bone. 02/19/19 on evaluation today patient actually appears to be doing about the same in regard to her toe ulcer. Unfortunately she continues to show signs of bone exposure and infection at this point. There does not appear to be any evidence of worsening of the infection but I'm also not really sure that it's getting significantly better. She is on the Augmentin which should be sufficient for the Staphylococcus aureus infection that she has at this point. With that being said she may need IV antibiotics to more appropriately treat this. We did have a discussion today about hyperbaric option therapy. Electronic Signature(s) Signed: 02/20/2019 7:59:38 AM By: Worthy Keeler PA-C Entered By: Worthy Keeler on 02/19/2019 12:47:22 [name]  ([id number]) -------------------------------------------------------------------------------- Physical Exam Details Patient Name: Heather Dear A. Date of Service: 02/19/2019 11:15 AM Medical Record Number: [id number] Patient Account Number: [id number] Date of Birth/Sex: 1976-03-30 (43 y.o. F) Treating RN: Montey Hora Primary Care Provider: Priscille Kluver Other Clinician: Referring Provider: Priscille Kluver Treating Provider/Extender: STONE III, Maxie Debose Weeks in Treatment: 28 Constitutional Obese and well-hydrated in no acute distress. Respiratory normal breathing without difficulty. clear to auscultation bilaterally. Cardiovascular regular rate and rhythm with normal S1, S2. Psychiatric this patient is able to make decisions and demonstrates good insight into disease process. Alert and Oriented x 3. pleasant and cooperative. Notes Upon inspection today patient's wound bed did not require sharp debridement. With that being said there's definitely some pasta that was noted coming from the joint area at this time and there does not appear to be any evidence of worsening infection in general but I am concerned about the fact that the patient has been having a lot of difficulty with getting this area to heal and now both her x-ray as well as the culture which was a bone culture seem to indicate that this is indeed osteomyelitis. I do believe that she likely needs IV antibiotics in order to see this was all about though I think she is that a big risk for the possibility of amputation. Electronic Signature(s) Signed: 02/20/2019 7:59:38 AM By: Worthy Keeler PA-C Entered By: Worthy Keeler on 02/19/2019 12:51:09 [name] ([id number]) -------------------------------------------------------------------------------- Physician Orders Details Patient Name: Heather Dear A. Date of Service: 02/19/2019 11:15 AM Medical Record Number: [id number] Patient Account Number: [id number] Date of  Birth/Sex: February 06, 1976 (43 y.o. 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 Anesthetic (add to Medication List) Wound #5 Left Toe Great o Topical Lidocaine 4% cream applied to wound bed prior to debridement (In Clinic Only). Primary Wound Dressing Wound #5 Left Toe Great o Silver Alginate Secondary Dressing Wound #5 Left Toe Great o Conform/Kerlix o Foam Dressing Change Frequency Wound #5 Left Toe Great o Three times weekly Follow-up Appointments Wound #5 Left Toe Great o Return Appointment in 1 week. Off-Loading Wound #5 Left Toe Great o Open toe surgical shoe to: - Wear your surgical shoe with your brace. No tennis Shoes! Additional Orders / Instructions Wound #5 Left Toe Great o Increase protein intake. DASHONDA, BONNEAU A. ([id number]) Medications-please add to medication list. Wound #5 Left Toe Great o P.O. Antibiotics Electronic Signature(s) Signed: 02/19/2019 3:19:36 PM By: Montey Hora Signed: 02/20/2019 7:59:38 AM By: Worthy Keeler PA-C Entered By: Montey Hora on 02/19/2019 11:59:09 Cahall, Vianne AMarland Kitchen ([id number]) -------------------------------------------------------------------------------- Problem List Details Patient Name: Heather Dear A. Date of Service: 02/19/2019 11:15 AM Medical Record Number: [id number] Patient Account Number: [id number] Date of Birth/Sex: 1975-11-26 (43 y.o. 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 ([id number]) -------------------------------------------------------------------------------- Progress Note Details Patient Name: Heather Dear A. Date of Service: 02/19/2019 11:15 AM Medical Record Number: [id number] Patient Account Number: [id number] Date of Birth/Sex: [birth year] (43 y.o. F) Treating RN: Montey Hora Primary Care Provider: Priscille Kluver Other Clinician: Referring Provider: Priscille Kluver Treating Provider/Extender: Melburn Hake, Keriana Sarsfield Weeks in Treatment: 28 Subjective Chief Complaint Information obtained from Patient Left great toe ulcer History of Present Illness (HPI) 01/18/18-She is here for initial evaluation of the left great toe ulcer. She is a poor historian in regards to timeframe in detail. She states approximately 4 weeks ago she lacerated her toe on something in the house. She followed up with her primary care who placed her on Bactrim and ultimately a second dose of Bactrim prior to coming to wound clinic. She states she has been treating the toe with peroxide, Betadine and a Band-Aid. She did not check her blood sugar this morning but checked it yesterday morning it was  327; she is unaware of a recent A1c and there are no current records. She saw Dr. she would've orthopedics last week for an old injury to the left ankle, she states he did not see her toe, nor did she bring it to his attention. She smokes approximately 1 pack cigarettes a day. Her social situation is concerning, she arrives this morning with her mother who appears extremely intoxicated/under the influence; her mother was asked to leave the room and be monitored by the patient's grandmother. The patient's aunt then accompanied the patient and the room throughout the rest of the appointment. We had a lengthy discussion regarding the deleterious effects of uncontrolled hyperglycemia and smoking as  it relates to wound healing and overall health. She was strongly encouraged to decrease her smoking and get her diabetes under better control. She states she is currently on a diet and has cut down her [facility] consumption. The left toe is erythematous, macerated and slightly edematous with malodor present. The edema in her left foot is below her baseline, there is no erythema streaking. We will treat her with Santyl, doxycycline; we have ordered and xray, culture and provided a Peg assist surgical shoe and cultured the wound. 01/25/18-She is here in follow-up evaluation for a left great toe ulcer and presents with an abscess to her suprapubic area. She states her blood sugars remain elevated, feeling "sick" and if levels are below 250, but she is trying. She has made no attempt to decrease her smoking stating that we "can't take away her food in her cigarettes". She has been compliant with offloading using the PEG assist you. She is using Santyl daily. the culture obtained last week grew staph aureus and Enterococcus faecalis; continues on the doxycycline and Augmentin was added on Monday. The suprapubic area has erythema, no femoral variation, purple discoloration, minimal induration, was accessed with a  cotton tip applicator with sanguinopurulent drainage, this was cultured, I suspect the current antibiotic treatment will cover and we will not add anything to her current treatment plan. She was advised to go to urgent care or ER with any change in redness, induration or fever. 02/01/18-She is here in follow-up evaluation for left great toe ulcers and a new abdominal abscess from last week. She was able to use packing until earlier this week, where she "forgot it was there". She states she was feeling ill with GI symptoms last week and was not taking her antibiotic. She states her glucose levels have been predominantly less than 200, with occasional levels between 200-250. She thinks this was contributing to her GI symptoms as they have resolved without intervention. There continues to be significant laceration to left toe, otherwise it clinically looks stable/improved. There is now less superficial opening to the lateral aspect of the great toe that was residual blister. We will transition to [facility] to all wounds, she will continue her Augmentin. If there is no change or deterioration next week for reculture. 02/08/18-She is here in follow-up evaluation for left great toe ulcer and abdominal ulcer. There is an improvement in both wounds. She has been wrapping her left toe with coban, not by our direction, which has created an area of discoloration to the medial aspect; she has been advised to NOT use coban secondary to her neuropathy. She states her glucose levels have been high over this last week ranging from 200-350, she continues to smoke. She admits to being less compliant with her offloading shoe. We will continue with same treatment plan and she will follow-up next week. 02/15/18-She is here in follow-up evaluation for left great toe ulcer and abdominal ulcer. The abdominal ulcer is epithelialized. The left great toe ulcer is improved and all injury from last week using the Coban wrap is  resolved, the lateral ulcer is healed. She admits to noncompliance with wearing offloading shoe and admits to glucose levels being greater than 300 most of the week. She continues to smoke and expresses no desire to quit. There is one area medially that probes deeper than it has historically, erythema to the toe and dorsal foot has consistently waxed and waned. There is no overt signs of cellulitis or infection but we will  culture the wound for any occult infection given the new area of depth and erythema. We will hold off on Ardoin, Mallie A. ([id number]) sensitivities for initiation of antibiotic therapy. 02/22/18-She is here in follow up evaluation for left great toe ulcer. There is overall significant improvement in both wound appearance, erythema and edema with changes made last week. She was not initiated on antibiotic therapy. Culture obtained last week showed oxacillin sensitive staph aureus, sensitive to clindamycin. Clindamycin has been called into the pharmacy but she has been instructed to hold off on initiation secondary to overall clinical improvement and her history of antibiotic intolerance. She has been instructed to contact the clinic with any noted changes/deterioration and the wound, erythema, edema and/or pain. She will follow-up next week. She continues to smoke and her glucose levels remain elevated >250; she admits to compliance with offloading shoe 03/01/18 on evaluation today patient appears to be doing fairly well in regard to her left first toe ulcer. She has been tolerating the dressing changes with the [facility] Dressing without complication and overall this has definitely showed signs of improvement according to records as well is what the patient tells me today. I'm very pleased in that regard. She is having no pain today 03/08/18 She is here for follow up evaluation of a left great toe ulcer. She remains non-compliant with glucose control and smoking cessation;  glucose levels consistently >200. She states that she got new shoe inserts/peg assist. She admits to compliance with offloading. Since my last evaluation there is significant improvement. We will switch to prisma at this time and she will follow up next week. She is noted to be tachycardic at this appointment, heart rate 120s; she has a history of heart rate 70-130 according to our records. She admits to extreme agitation r/t personal issues; she was advised to monitor her heartrate and contact her physician if it does not return to a more normal range (<100). She takes cardizem twice daily. 03/15/18-She is here in follow-up evaluation for left great toe ulcer. She remains noncompliant with glucose control and smoking cessation. She admits to compliance with wearing offloading shoe. The ulcer is improved/stable and we will continue with the same treatment plan and she will follow-up next week 03/22/18-She is here for evaluation for left great toe ulcer. There continues to be significant improvement despite recurrent hyperglycemia (over 500 yesterday) and she continues to smoke. She has been compliant with offloading and we will continue with same treatment plan and she will follow-up next week. 03/29/18-She is here for evaluation for left great toe ulcer. Despite continuing to smoke and uncontrolled diabetes she continues to improve. She is compliant with offloading shoe. We will continue with the same treatment plan and she will follow-up next week 04/05/18- She is here in follow up evaluation for a left great toe ulcer; she presents with small pustule to left fifth toe (resembles ant bite). She admits to compliance with wearing offloading shoe; continues to smoke or have uncontrolled blood glucose control. There is more callus than usual with evidence of bleeding; she denies known trauma. 04/12/18-She is here for evaluation of left great toe ulcer. Despite noncompliance with glycemic control and smoking  she continues to make improvement. She continues to wear offloading shoe. The pustule, that was identified last week, to the left fifth toe is resolved. She will follow-up in 2 weeks 05/03/18-she is seen in follow-up evaluation for a left great toe ulcer. She is compliant with offloading, otherwise noncompliant with  glycemic control and smoking. She has plateaued and there is minimal improvement noted. We will transition to [facility], replaced the insert to her surgical shoe and she will follow-up in one week 05/10/18- She is here in follow up evaluation for a left great toe ulcer. It appears stable despite measurement change. We will continue with same treatment plan and follow up next week. 05/24/18-She is seen in follow-up evaluation for a left great toe ulcer. She remains compliant with offloading, has made significant improvement in her diet, decreasing the amount of sugar/soda. She said her recent A1c was 10.9 which is lower than. She did see a diabetic nutritionist/educator yesterday. She continues to smoke. We will continue with the same treatment plan and she'll follow-up next week. 05/31/18- She is seen in follow-up evaluation for left great toe ulcer. She continues to remain compliant with offloading, continues to make improvement in her diet, increasing her water and decreasing the amount of sugar/soda. She does continue to smoke with no desire to quit. We will apply Prisma to the depth and Hydrofera Blue over. We have not received insurance authorization for oasis. She will follow up next week. 06/07/18-She is seen in follow-up evaluation for left great toe ulcer. It has stalled according to today's measurements although base appears stable. She says she saw a diabetic educator yesterday; her average blood sugars are less than 300 which is an improvement for her. She continues to smoke and states "that's my next step" She continues with water over soda. We will order for xray, culture  and reinstate ace wrap compression prior to placing apligraf for next week. She is voicing no complaints or concerns. Her dressing will change to iodoflex over the next week in preparation for apligraf. 06/14/18-She is seen in follow-up evaluation for left great toe ulcer. Plain film x-ray performed last week was negative for osteomyelitis. Wound culture obtained last week grew strep B and OSSA; she is initiated on keflex and cefdinir today; there is erythema to the toe which could be from ace wrap compression, she has a history of wrapping too tight and has has been encouraged to maintain ace wraps that we place today. We will hold off on application of apligraf today, will apply next week after antibiotic therapy has been initiated. She admits today that she has resumed taking a shower with her foot/toe submerged in water, she has been reminded to keep foot/toe out of the bath water. She will be seen in follow up next week 06/21/18-she is seen in follow-up evaluation for left great toe ulcer. She is tolerating antibiotic therapy with no GI disturbance. The wound is stable. Apligraf was applied today. She has been decreasing her smoking, only had 4 cigarettes yesterday and Stickler, Gerrica A. ([id number]) 1 today. She continues being more compliant in diabetic diet. She will follow-up next week for evaluation of site, if stable will remove at 2 weeks. 06/28/18- She is here in follow up evalution. Apligraf was placed last week, she states the dressing fell off on Tuesday and she was dressing with hydrofera blue. She is healed and will be discharged from the clinic today. She has been instructed to continue with smoking cessation, continue monitoring glucose levels, offloading for an additional 4 weeks and continue with hydrofera blue for additional two weeks for any possible microscopic opening. Readmission: 08/07/18 on evaluation today patient presents for reevaluation concerning the ulcer of her right  great toe. She was previously discharged on 06/28/18 healed. Nonetheless she states that this  began to show signs of drainage she subsequently went to her primary care provider. Subsequently an x-ray was performed on 08/01/18 which was negative. The patient was also placed on antibiotics at that time. Fortunately they should have been effective for the infection. Nonetheless she's been experiencing some improvement but still has a lot of drainage coming from the wound itself. 08/14/18 on evaluation today patient's wound actually does show signs of improvement in regard to the erythema at this point. She has completed the antibiotics. With that being said we did discuss the possibility of placing her in a total contact cast as of today although I think that I may want to give this just a little bit more time to ensure nothing recurrence as far as her infection is concerned. I do not want to put in the cast and risk infection at that time if things are not completely resolved. With that being said she is gonna require some debridement today. 08/21/18 on evaluation today patient actually appears to be doing okay in regard to her toe ulcer. She's been tolerating the dressing changes without complication. With that being said it does appear that she is ready and in fact I think it's appropriate for [place] to go ahead and initiate the total contact cast today. Nonetheless she will require some sharp debridement to prepare the wound for application. Overall I feel like things have been progressing well but we do need to do something to get this to close more readily. 08/24/18 patient seen today for reevaluation after having had the total contact cast applied on Tuesday. She seems to have done very well the wound appears to be doing great and overall I'm pleased with the progress that she's made. There were no abnormal areas of rubbing from the cast on her lower extremity. 08/30/18 on evaluation today patient  actually appears to be completely healed in regard to her plantar toe ulcer. She tells me at this point she's been having a lot of issues with the cast. She almost fell a couple of times the state shall the step of her dog a couple times as well. This is been a very frustrating process for her other nonetheless she has completely healed the wound which is excellent news. Overall there does not appear to be the evidence of infection at this time which is great news. 09/11/18 evaluation today patient presents for follow-up concerning her great toe ulcer on the left which has unfortunately reopened since I last saw her which was only a couple of weeks ago. Unfortunately she was not able to get in to get the shoe and potentially the AFO that's gonna be necessary due to her left foot drop. She continues with offloading shoe but this is not enough to prevent her from reopening it appears. When we last had her in the total contact cast she did well from a healing standpoint but unfortunately the wound reopened as soon as she came out of the cast within just a couple of weeks. 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 This is also part of the reason why she falls on a regular basis. I just do not believe that is gonna be safe for [place] to reinitiate the total contact cast as last time we had this  on she fell 3 times one week which is definitely not normal for her. 09/18/18 upon evaluation today the patient actually appears to be doing about the same in regard to her toe ulcer. She did not contact [facility] as I asked her to even though I had given her the prescription. In fact she actually states that she has no idea where the prescription is. She did apparently call [facility] and they told her that all she needed to do  was bring the prescription in order to be able to be seen and work on getting the AFO for her left foot. With all that being said she still does not have an appointment and I'm not sure were things stand that regard. I will give her a new prescription today in order to contact them to get this set up. 09/25/18 on evaluation today patient actually appears to be doing about the same in regard to her toes ulcer. She does have a small areas which seems to have a lot of callous buildup around the edge of the wound which is going to need sharp debridement today. She still is waiting to be scheduled for evaluation with [facility] for possibility of an AFO. She states there supposed to call her tomorrow to get this set up. Unfortunately it does appear that her foot specifically the toe area is showing signs of erythema. There does not appear to be any systemic infection which is in these good news. AUBREANNA, PERCLE A. ([id number]) 10/02/18 on evaluation today patient actually appears to be doing about the same in regard to her toe ulcer. This really has not done too well although it's not significantly larger it's also not significantly smaller. She has been tolerating the dressing changes without complication. She actually has her appointment with [facility] and [place] tomorrow to hopefully be measured for obtaining and AFO splint. I think this would be helpful preventing this from reoccurring. We had contemplated starting the cast this week although to be honest I am reluctant to do that as she's been having nausea, vomiting, and seizure activity over the past three days. She has a history of seizures and have been told is nothing that can be done for these. With that being said I do believe that along with the seizures have the nausea vomiting which upon further questioning doesn't seem to be the normal for her and makes me concerned for the possibility of infection or something else going on. I discussed this  with the patient and her mother during the office visit today. I do not feel the wound is effective but maybe something else. The responses this was "this just happens to her at times and we don't know why". They did not seem to be interested in going to the hospital to have this checked out further. 10/09/18 on evaluation today patient presents for follow-up concerning her ongoing toe ulcer. She has been tolerating the dressing changes without complication. Fortunately there does not appear to be any evidence of infection which is great news however I do think that the patient would benefit from going ahead for with the total contact cast. She's actually in a wheelchair today she tells me that she will use her walker if we initiate the cast. I was very specific about the fact that if we were gonna do the cast I wanted to make sure that she was using the walker in order to prevent any falls. She tells me she does not have stairs that she has  to traverse on a regular basis at her home. She has not had any seizures since last week again that something that happens to her often she tells me she did talk to [employer] and they said that it may take up to three weeks to get the brace approved for her. Hopefully that will not take that long but nonetheless in the meantime I do think the cast could be of benefit. 10/12/18 on evaluation today patient appears to be doing rather well in regard to her toe ulcer. It's just been a few days and already this is significantly improved both as far as overall appearance and size. Fortunately there's no sign of infection. She is here for her first obligatory cast change. 10/19/18 Seen today for follow up and management of left great toe ulcer. Wound continues to show improvement. Noted small open area with seroussang drainage with palpation. Denies any increased pain or recent fevers during visit. She will continue calcium alginate with offloading shoe. Denies any  questions or concerns during visit. 10/26/18 on evaluation today patient appears to be doing about the same as when I last saw her in regard to her wound bed. Fortunately there does not appear to be any signs of infection. Unfortunately she continues to have a breakdown in regard to the toe region any time that she is not in the cast. It takes almost no time at all for this to happen. Nonetheless she still has not heard anything from the brace being made by [employer] as to when exactly this will be available to her. Fortunately there is no signs of infection at this time. 10/30/18 on evaluation today patient presents for application of the total contact cast as we just received him this morning. Fortunately we are gonna be able to apply this to her today which is great news. She continues to have no significant pain which is good news. Overall I do feel like things have been improving while she was the cast is when she doesn't have a cast that things get worse. She still has not really heard anything from [facility] regarding her brace. 11/02/18 upon evaluation today patient's wound already appears to be doing significantly better which is good news. Fortunately there does not appear to be any signs of infection also good news. Overall I do think the total contact cast as before is helping to heal this area unfortunately it's just not gonna likely keep the area closed and healed without her getting her brace at least. Again the foot drop is a significant issue for her. 11/09/18 on evaluation today patient appears to be doing excellent in regard to her toe ulcer which in fact is completely healed. Fortunately we finally got the situation squared away with the paperwork which was needed to proceed with getting her brace approved by Medicaid. I have filled that out unfortunately that information has been sent to the orthopedic office that I worked at 2 1/2 years ago and not tired Current wound care measures.  Fortunately she seems to be doing very well at this time. 11/23/18 on evaluation today patient appears to be doing More Poorly Compared to Last Time I Saw Her. At [facility] She Had Completely Healed. Currently she is continuing to have issues with reopening. She states that she just found out that the brace was approved through Medicaid now she just has to go get measured in order to have this fitted for her and then made. Subsequently she does not have an appointment  for this yet that is going to complicate things we obviously cannot put her back in the cast if we do not have everything measured because they're not gonna be able to measure her foot while she is in the cast. 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 Obviously this is unfortunate and does have me somewhat worried as well. 11/30/18 on evaluation today patient's toe ulcer actually appears to be doing fairly well. The good news is she will be getting her Mcmanaway, Regena A. ([id number]) brace in the shoes next week on Wednesday. Hopefully we will be able to get this to heal without having to go back in the cast however she may need the cast in order to get the wound completely heal and then go from there. Fortunately there's no signs of infection at this time. 12/07/18 on evaluation today patient fortunately did receive her brace and she states she could tell this definitely makes her walk better. With that being said she's been having issues with her toe where she noticed yesterday there was a lot of tissue that was loosing off this appears to be much larger than what it was previous. She also states that her leg has been read putting much across the top of her foot just about the ankle although this seems to be receiving somewhat. The total area is still red and appears to be someone infected as best I can tell. She is previously taken  Bactrim and that may be a good option for her today as well. We are gonna see what I wound culture shows as well and I think that this is definitely appropriate. With that being said outside of the culture I still need to initiate something in the interim and that's what I'm gonna go ahead and select Bactrim is a good option for her. 12/14/18 on evaluation today patient appears to be doing better in regard to her left great toe ulcer as compared to last week's evaluation. There's still some erythema although this is significantly improved which is excellent news. Overall I do believe that she is making good progress is still gonna take some time before she is where I would like her to be from the standpoint of being able to place her back into the total contact cast. Hopefully we will be where we need to be by next week. 12/21/18 on evaluation today patient actually appears to be doing poorly in regard to her toe ulcer. She's been tolerating the dressing changes without complication. Fortunately there's no signs of systemic infection although she does have a lot of drainage from the toe ulcer and this does seem to be causing some issues at this point. She does have erythema on the distal portion of her toe that appears to be likely cellulitis. 12/28/18 on evaluation today patient actually appears to be doing a little better in my pinion in regard to her toe ulcer. With that being said she still does have some evidence of infection at this time and for her culture she had both E. coli as well as enterococcus as organisms noted on evaluation. For that reason I think that though the Keflex likely has treated the E. coli rather well this has really done nothing for the enterococcus. We are going to have to initiate treatment for this specifically. 01/04/19 on evaluation today patient's toe actually appears to be doing better  from the standpoint of infection. She currently would like to see about putting the  cash back on I think that this is appropriate as long as she takes care of it and keeps it from getting wet. She is gonna have some drainage we can definitely pass this up with Drawtex and alginate to try to prevent as much drainage as possible from causing the problems. With that being said I do want to at least try her with the cast between now and Tuesday. If there any issues we can't continue to use it then I will discontinue the use of the cast at that point. 01/08/19 on evaluation today patient actually appears to be doing very well as far as her foot ulcer specifically the great toe on the left is concerned. She did have an area of rubbing on the medial aspect of her left ankle which again is from the cast. Fortunately there's no signs of infection at this point in this appears to be a very slight skin breakdown. The patient tells me she felt it rubbing but didn't think it was that bad. Fortunately there is no signs of active infection at this time which is good news. No fevers, chills, nausea, or vomiting noted at this time. 01/15/19 on evaluation today patient actually appears to be doing well in regard to her toe ulcer. Again as previous she seems to do well and she has the cast on which indicates to me that during the time she doesn't have a cast on she's putting way too much pressure on this region. Obviously I think that's gonna be an issue as with the current national emergency concerning the Covid-19 Virus it has been recommended that we discontinue the use of total contact casting by the chief medical officer of our company, Dr. Simona Huh. The reasoning is that if a patient becomes sick and cannot come into have the cast removed they could not just leave this on for an additional two weeks. Obviously the hospitals also do not want to receive patient's who are sick into the emergency department to potentially contaminate the region and spread the Covid-19 Virus among other sick individuals  within the hospital system. Therefore at this point we are suspending the use of total contact cast until the current emergency subsides. This was all discussed with the patient today as well. 01/22/19 on evaluation today patient's wound on her left great toe appears to be doing slightly worse than previously noted last week. She tells me that she has been on this quite a bit in fact she tells me she's been awake for 38 straight hours. This is due to the fact that she's having to care for grandparents because nobody else will. She has been taking care of them for five the last seven days since I've seen her they both have dementia his is from a stroke and her grandmother's was progressive. Nonetheless she states even her mom who knows her condition and situation has only help two of those days to take care of them she's been taking care of the rest. Fortunately there does not appear to be any signs of active infection in regard to her toe at this point although obviously it doesn't look as good as it did previous. I think this is directly related to her not taking off the pressure and friction by way of taking things easy. Though I completely understand what's going on. TAVIE, HASEMAN ([id number]) 01/29/19 on evaluation today patient's tools are actually appears  to be showing some signs of improvement today compared to last week's evaluation as far as not necessarily the overall size of the wound but the fact that she has some new skin growth in between the two ends of the wound opening. Overall I feel like she has done well she states that she had a family member give her what sounds to be a CAM walker boot which has been helpful as well. 02/05/19 on evaluation today patient's wound bed actually appears to be doing significantly better in regard to her overall appearance of the size of the wound. With that being said she is still having an issue with offloading efficiently enough to get this to  close. Apparently there is some signs of infection at this point as well unfortunately. Previously she's done well of Augmentin I really do not see anything that needs to be culture currently but there theme and cellulitis of the foot that I'm seeing I'm gonna go ahead and place her on an antibiotic today to try to help clear this up. 02/12/2019 on evaluation today patient actually appears to be doing poorly in regard to her overall wound status. She tells me she has been using her offloading shoe but actually comes in today wearing her tennis shoe with the AFO brace. Again as I previously discussed with her this is really not sufficient to allow the area to heal appropriately. Nonetheless she continues to be somewhat noncompliant and I do wonder based on what she has told my nurse in the past as to whether or not a good portion of this noncompliance may be recreational drug and alcohol related. She has had a history of heroin overdose and this was fairly recently in the past couple of months that have been seeing her. Nonetheless overall I feel like her wound looks significantly worse today compared to what it was previous. She still has significant erythema despite the Augmentin I am not sure that this is an appropriate medication for her infection I am also concerned that the infection may have gone down into her bone. 02/19/19 on evaluation today patient actually appears to be doing about the same in regard to her toe ulcer. Unfortunately she continues to show signs of bone exposure and infection at this point. There does not appear to be any evidence of worsening of the infection but I'm also not really sure that it's getting significantly better. She is on the Augmentin which should be sufficient for the Staphylococcus aureus infection that she has at this point. With that being said she may need IV antibiotics to more appropriately treat this. We did have a discussion today about hyperbaric  option therapy. Patient History Information obtained from Patient. Social History Current every day smoker. Medical History Ear/Nose/Mouth/Throat Patient has history of Chronic sinus problems/congestion, Middle ear problems Endocrine Patient has history of Type II Diabetes Review of Systems (ROS) Constitutional Symptoms (General Health) Denies complaints or symptoms of Fatigue, Fever, Chills, Marked Weight Change. Respiratory Denies complaints or symptoms of Chronic or frequent coughs, Shortness of Breath. Cardiovascular Denies complaints or symptoms of Chest pain, LE edema. Psychiatric Denies complaints or symptoms of Anxiety, Claustrophobia. Objective Redmann, Mckensey A. ([id number]) Constitutional Obese and well-hydrated in no acute distress. Vitals Time Taken: 11:27 AM, Height: 69 in, Weight: 240 lbs, BMI: 35.4, Temperature: 98.3 F, Pulse: 85 bpm, Respiratory Rate: 18 breaths/min, Blood Pressure: 95/66 mmHg. Respiratory normal breathing without difficulty. clear to auscultation bilaterally. Cardiovascular regular rate and rhythm with normal S1, S2.  Psychiatric this patient is able to make decisions and demonstrates good insight into disease process. Alert and Oriented x 3. pleasant and cooperative. General Notes: Upon inspection today patient's wound bed did not require sharp debridement. With that being said there's definitely some pasta that was noted coming from the joint area at this time and there does not appear to be any evidence of worsening infection in general but I am concerned about the fact that the patient has been having a lot of difficulty with getting this area to heal and now both her x-ray as well as the culture which was a bone culture seem to indicate that this is indeed osteomyelitis. I do believe that she likely needs IV antibiotics in order to see this was all about though I think she is that a big risk for the possibility of amputation. Integumentary  (Hair, Skin) Wound #5 status is Open. Original cause of wound was Gradually Appeared. The wound is located on the Left Toe Great. The wound measures 3cm length x 1cm width x 0.4cm depth; 2.356cm^2 area and 0.942cm^3 volume. There is bone and Fat Layer (Subcutaneous Tissue) Exposed exposed. There is no tunneling noted, however, there is undermining starting at 10:00 and ending at 5:00 with a maximum distance of 0.5cm. There is a medium amount of serous drainage noted. The wound margin is epibole. There is small (1-33%) pink, hyper - granulation within the wound bed. There is a large (67-100%) amount of necrotic tissue within the wound bed including Adherent Slough. The periwound skin appearance exhibited: Callus, Maceration, Rubor, Erythema. The periwound skin appearance did not exhibit: Crepitus, Excoriation, Induration, Rash, Scarring, Dry/Scaly, Atrophie Blanche, Cyanosis, Ecchymosis, Hemosiderin Staining, Mottled, Pallor. The surrounding wound skin color is noted with erythema which is circumferential. Periwound temperature was noted as Hot. Assessment Active Problems ICD-10 Type 2 diabetes mellitus with foot ulcer Non-pressure chronic ulcer of other part of left foot with fat layer exposed Foot drop, left foot Other chronic osteomyelitis, left ankle and foot Plan Wound Cleansing: Fatzinger, Koi A. ([id number]) Wound #5 Left Toe Great: Clean wound with Normal Saline. Anesthetic (add to Medication List): Wound #5 Left Toe Great: Topical Lidocaine 4% cream applied to wound bed prior to debridement (In Clinic Only). Primary Wound Dressing: Wound #5 Left Toe Great: Silver Alginate Secondary Dressing: Wound #5 Left Toe Great: Conform/Kerlix Foam Dressing Change Frequency: Wound #5 Left Toe Great: Three times weekly Follow-up Appointments: Wound #5 Left Toe Great: Return Appointment in 1 week. Off-Loading: Wound #5 Left Toe Great: Open toe surgical shoe to: - Wear your surgical  shoe with your brace. No tennis Shoes! Additional Orders / Instructions: Wound #5 Left Toe Great: Increase protein intake. Medications-please add to medication list.: Wound #5 Left Toe Great: P.O. Antibiotics I discussed with the patient today that I think her best bet for attempting to lower the risk of possible imputation would be for [place] to combine good wound care along with the possibility of IV antibiotics if infectious diseases in the in agreement with this as well as hyperbaric option therapy. I discussed what this would entail including the time commitment that would be required with her being able to come in five days a week for eight weeks in order to see the treatment complete. With that being said after discussing this with the patient she's not sure that she would have a ride that could get her here on that schedule. She's going to look into this and then make a decision and  let me know. If indeed she decide to go forward with the hyperbaric option therapy then I would have to also likely get her set up for an x-ray of her chest as well as an EKG in order to ensure that there are no other complicating factors. Otherwise we are gonna see were things stand at follow-up in one weeks time. Please see above for specific wound care orders. We will see patient for re-evaluation in 1 week(s) here in the clinic. If anything worsens or changes patient will contact our office for additional recommendations. the patient is supposed to contact me tomorrow with your answers if not later this afternoon as to whether or not she wants to proceed with hyperbaric option therapy. Electronic Signature(s) Signed: 02/20/2019 7:59:38 AM By: Worthy Keeler PA-C Entered By: Worthy Keeler on 02/19/2019 12:52:53 [name] ([id number]) -------------------------------------------------------------------------------- ROS/PFSH Details Patient Name: Heather Dear A. Date of Service: 02/19/2019 11:15  AM Medical Record Number: [id number] Patient Account Number: [id number] Date of Birth/Sex: February 07, 1976 (43 y.o. F) Treating RN: Montey Hora Primary Care Provider: Priscille Kluver Other Clinician: Referring Provider: Priscille Kluver Treating Provider/Extender: STONE III, Analina Filla Weeks in Treatment: 28 Information Obtained From Patient Constitutional Symptoms (General Health) Complaints and Symptoms: Negative for: Fatigue; Fever; Chills; Marked Weight Change Respiratory Complaints and Symptoms: Negative for: Chronic or frequent coughs; Shortness of Breath Cardiovascular Complaints and Symptoms: Negative for: Chest pain; LE edema Psychiatric Complaints and Symptoms: Negative for: Anxiety; Claustrophobia Ear/Nose/Mouth/Throat Medical History: Positive for: Chronic sinus problems/congestion; Middle ear problems Endocrine Medical History: Positive for: Type II Diabetes Time with diabetes: 10 years Treated with: Insulin, Oral agents Blood sugar tested every day: No Blood sugar testing results: Lunch: 350 HBO Extended History Items Ear/Nose/Mouth/Throat: Ear/Nose/Mouth/Throat: Chronic sinus problems/congestion Middle ear problems Immunizations Pneumococcal Vaccine: Received Pneumococcal Vaccination: No Implantable Devices No devices added [place], Tyger A. ([id number]) Family and Social History Current every day smoker Physician Affirmation I have reviewed and agree with the above information. Electronic Signature(s) Signed: 02/19/2019 3:19:36 PM By: Montey Hora Signed: 02/20/2019 7:59:38 AM By: Worthy Keeler PA-C Entered By: Worthy Keeler on 02/19/2019 12:50:54 [name] ([id number]) -------------------------------------------------------------------------------- SuperBill Details Patient Name: Heather Dear A. Date of Service: 02/19/2019 Medical Record Number: [id number] Patient Account Number: [id number] Date of Birth/Sex: [birth year] (43 y.o. F) Treating RN:  Montey Hora Primary Care Provider: Priscille Kluver Other Clinician: Referring Provider: Priscille Kluver Treating Provider/Extender: Melburn Hake, Zamzam Whinery Weeks in Treatment: 28 Diagnosis Coding ICD-10 Codes Code Description E11.621 Type 2 diabetes mellitus with foot ulcer L97.522 Non-pressure chronic ulcer of other part of left foot with fat layer exposed M21.372 Foot drop, left foot M86.672 Other chronic osteomyelitis, left ankle and foot Facility Procedures CPT4 Code: [id number] Description: 99213 - WOUND CARE VISIT-LEV 3 EST PT Modifier: Quantity: 1 Physician Procedures CPT4 Code: 5102585 Description: 27782 - WC PHYS LEVEL 4 - EST PT ICD-10 Diagnosis Description E11.621 Type 2 diabetes mellitus with foot ulcer L97.522 Non-pressure chronic ulcer of other part of left foot with fa M21.372 Foot drop, left foot M86.672 Other chronic  osteomyelitis, left ankle and foot Modifier: t layer exposed Quantity: 1 Electronic Signature(s) Signed: 02/20/2019 7:59:38 AM By: Worthy Keeler PA-C Entered By: Worthy Keeler on 02/19/2019 12:53:14

## 2019-02-25 ENCOUNTER — Ambulatory Visit: Payer: Medicaid Other | Admitting: Physician Assistant

## 2019-02-26 ENCOUNTER — Other Ambulatory Visit: Payer: Self-pay

## 2019-02-26 ENCOUNTER — Ambulatory Visit (INDEPENDENT_AMBULATORY_CARE_PROVIDER_SITE_OTHER): Payer: Medicaid Other | Admitting: Infectious Diseases

## 2019-02-26 ENCOUNTER — Encounter: Payer: Self-pay | Admitting: Infectious Diseases

## 2019-02-26 VITALS — Temp 98.5°F

## 2019-02-26 DIAGNOSIS — IMO0002 Reserved for concepts with insufficient information to code with codable children: Secondary | ICD-10-CM

## 2019-02-26 DIAGNOSIS — E1165 Type 2 diabetes mellitus with hyperglycemia: Secondary | ICD-10-CM

## 2019-02-26 DIAGNOSIS — E118 Type 2 diabetes mellitus with unspecified complications: Secondary | ICD-10-CM

## 2019-02-26 DIAGNOSIS — Z720 Tobacco use: Secondary | ICD-10-CM

## 2019-02-26 DIAGNOSIS — A4901 Methicillin susceptible Staphylococcus aureus infection, unspecified site: Secondary | ICD-10-CM | POA: Diagnosis not present

## 2019-02-26 DIAGNOSIS — M86172 Other acute osteomyelitis, left ankle and foot: Secondary | ICD-10-CM

## 2019-02-26 DIAGNOSIS — F1990 Other psychoactive substance use, unspecified, uncomplicated: Secondary | ICD-10-CM

## 2019-02-26 NOTE — Progress Notes (Signed)
Subjective:    Patient ID: [name], female    DOB: 04/27/1976, 43 y.o.   MRN: [id number]  HPI The patient is a 43 y/o WF smoker with poorly controlled DM and recent h/o heroin overdose presenting for an evaluation for LT great toe osteomyelitis.  Unfortunately, the patient is an extremely poor informant regarding her own medical history, so much of the patient's history was gathered through her wound care notes.  Her mother accompanies her to today's visit but is able to offer little additional history as well.  In total, the patient has had an ongoing ulceration to her left great toe for much of the last 3 years.  She first came under care of our [facility] wound care clinic in January 2019, however.  While she diligently attends visits for her left great toe, she continues to smoke heavily, and abuse drugs, and do little to improve her glycemic control is often her blood sugars range from either 300-600/too high to read on her glucometer when she does check.  Her mother does state that the patient does not check her blood sugars daily. In fact, she most often checks weekly to every other week.  Despite this noncompliance, twice over the last year the wound care physicians have been able to successfully topically treat her wound along with occasional oral antibiotics well enough to allow for the wound to completely of healed.  The most recent time that this occurred was in the fall 2019.  Unfortunately, within a matter of weeks, her wound reopens and she returns to the wound care clinic.  She is unable to provide any history of traumatic injury injury to her foot to explain how her wound occurred and continues to recur.  Of note, in January of this year the patient was hospitalized for a heroin overdose.  Her abuse of heroin was an issue that was previously unknown to any of her medical providers.  Within the last several weeks, patient has had progressive worsening of her left great toe wound and an  aggressive debridement was performed just a week and a half ago.  She now has exposed bone and extremely macerated left great toe.  She has had amputation of her left great toe and recommended but has refused to have this procedure performed.  On my exam today, her wound appears much the same, so I had a prolonged discussion with her regarding benefit of amputation in the setting.  Again, she refused to consider this as a treatment option.  This year alone, she has received multiple oral antibiotics including Augmentin, doxycycline, and most recently oral Bactrim, all without success.  Recent plain films of her left foot are consistent with a new left great toe osteomyelitis.  She has not had any dominant germ grow repeatedly from her wound cultures.  Her most recent wound culture taken on February 13, 2019 at the time of her most recent debridement showed MSSA.  Past Medical History:  Diagnosis Date   Chronic combined systolic and diastolic CHF (congestive heart failure) ([place])    a. ECHO 04/23/14 LV EF: 45% - 50%,  mild LVH, diffuse hypokinesis. LV diastolic dysfunction, high ventricular filling pressure. Mild LA dilation.   Cocaine abuse ([place])    Diabetes mellitus without complication ([place])    Heroin overdose ([place]) 10/2018   Hypertension    Nicotine dependence with current use    Seizures ([place])    Family History  Problem Relation Age of Onset  Hypertension Other    Drug abuse Mother    Seizures Neg Hx    Social History   Tobacco Use   Smoking status: Current Every Day Smoker    Packs/day: 1.00    Years: 28.00    Pack years: 28.00   Smokeless tobacco: Never Used  Substance Use Topics   Alcohol use: Not Currently    Alcohol/week: 0.0 standard drinks    Comment: Quit drinking 11/16 per pt   Drug use: Yes    Types: Cocaine, Heroin    Review of Systems  Constitutional: Positive for fatigue. Negative for chills and fever.  HENT: Negative for congestion, hearing loss and  sinus pressure.   Eyes: Negative for photophobia, discharge, redness and visual disturbance.  Respiratory: Negative for apnea, cough, shortness of breath and wheezing.   Cardiovascular: Negative for chest pain and leg swelling.  Gastrointestinal: Negative for abdominal distention, abdominal pain, constipation, diarrhea, nausea and vomiting.  Endocrine: Negative for cold intolerance, heat intolerance, polydipsia and polyuria.  Genitourinary: Negative for dysuria, flank pain, frequency, urgency, vaginal bleeding and vaginal discharge.  Musculoskeletal: Positive for joint swelling. Negative for arthralgias, back pain and neck pain.       LT great toe swelling/pain  Skin: Positive for wound. Negative for pallor and rash.       LT great toe  Allergic/Immunologic: Negative for immunocompromised state.  Neurological: Positive for dizziness and speech difficulty. Negative for seizures, weakness and headaches.  Hematological: Does not bruise/bleed easily.  Psychiatric/Behavioral: Negative for agitation, confusion, hallucinations and sleep disturbance. The patient is nervous/anxious.    Vitals:   02/26/19 1002  Temp: 98.5 F (36.9 C)      Objective:  Physical Exam Gen: chronically ill-appearing, obese, slowed speech, moderate distress secondary to RT great toe pain/swelling, A&Ox 3 Head: NCAT, no temporal wasting evident EENT: PERRL, EOMI, MMM, poor dentition Neck: supple, no JVD CV: NRRR, no murmurs evident Pulm: CTA bilaterally, moderate expiratory wheeze, no retractions Abd: soft, obese, NTND, +BS Extrems: 1+ pitting RT LE edema, 2+ pulses MSK: advanced ulcer to LT great toe with significant necrosis and deformity of entire digit to near 1st LT MTP joint with seropurulent drainage and malodor, minimal surrounding erythema though, tip of LT 1st distal phalanx bone exposed Skin: 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 New osteolysis along the medial aspect of the tuft of the distal phalanx of the great toe concerning for osteomyelitis. 2. Subacute healing/healed fracture through the neck of the fourth metatarsal is new compared to 09/28/2018.   Electronically Signed   By: Jacqulynn Cadet M.D.   On: 02/12/2019 11:59     Assessment & Plan:  The patient is a 43 y/o WF smoker with poorly controlled DM and recent h/o heroin overdose presenting for an evaluation for LT great toe osteomyelitis.  Left great toe osteomyelitis -r Recent imaging on plain films as noted above and clinical exam are both consistent with advanced left great toe osteomyelitis.  Her most recent wound culture grew MSSA.  I shared with the patient that in most circumstances we would recommend that she receive a PICC line and parenteral antibiotics for minimum of [redacted] weeks along with aggressive surgical debridement at minimum.  I am extremely pessimistic regarding  medical monotherapy for treatment of her advanced left osteomyelitis and recommended that she undergo an amputation to her left great toe to prevent and encroaching cellulitis or worse and impending bacteremia/septic episode.  At that juncture, that would change her infection from being a partially limb threatening concern to a complete limb threatening issue that also carries a risk of death.  Despite this recommendation, the patient adamantly refused to even consider formal debridement let alone amputation of her great toe.  Given the circumstances, the best treatment option would likely be oral Sivextro 200 mg p.o. daily for 6 weeks.  I discussed this option for treatment with our pharmacy staff in the clinic who will work to complete the patient's prior authorization, but I expect a delay in  her receiving this drug of at least a few days.  The patient was instructed that should she develop fever or encroaching cellulitis to her left foot that she should present to her closest ER for an evaluation as she may require inpatient admission and parenteral antibiotics in interim.  We will check baseline CBC with differential, BMP, and CRP at today's visit.  I have scheduled the patient for follow-up in 3 to 4 weeks for reassessment.  IVDU - Unfortunately, her IV drug use precludes the safe use of parenteral antibiotics from home, particularly given comments that her mother also abuses substances and would be a poor candidate to supervise her treatment.  This is unfortunate as parenteral antibiotics are typically the mainstay of treatment for adult osteomyelitis.  Fortunately, Sivextro does have strong bone penetration and would be an adequate oral option for treatment of her osteomyelitis if approved for 6 weeks.  I do remain concerned, however, that her infection despite any medical treatment will fail given the advanced multi bone nature of her infection with clear necrosis visible on exam.  Poorly controlled DM - Simply stated, the patient must improve on this issue if she expects for her wounds to ever heal and not recur.  The patient admits that she has a glucometer at home but claims she does not have an adequate test strips.  I have asked that she reach out to her primary care physician and request these so that she has not made any means to check her blood sugar at least once a day if not preferably 3 times a day for the next 2 weeks.  I have asked the patient to bring her blood sugar logs in with her at her next appointment.  I have also asked her to work closely with her primary care physician to ensure that her blood sugars at least temporarily can be brought down to a goal of 150 or less at least with her morning sugars in order to optimize her infectious outcome.  Tobacco abuse - Despite  multiple recommendations from her wound care physician, the patient shows no interest in stopping smoking.  I discussed this issue with her at length, taking tremendous amount of time to explain the pathogenesis of nicotine as a vasoconstrictor which will impede wound healing.  I have stressed to her that if she fails to quit smoking, she will likely be at increased risk of not only great toe amputation bed later progressive amputations to her foot is any surgical wound will be challenged to heal as well.  She expressed understanding, but she would not commit to even reducing her cigarette use.

## 2019-02-26 NOTE — Patient Instructions (Signed)
Call clinic in 48 hrs if you have not heard from our office re: approval of your tidezolid.

## 2019-02-27 LAB — CBC WITH DIFFERENTIAL/PLATELET
Absolute Monocytes: 439 cells/uL (ref 200–950)
Basophils Absolute: 46 cells/uL (ref 0–200)
Basophils Relative: 0.6 %
Eosinophils Absolute: 139 cells/uL (ref 15–500)
Eosinophils Relative: 1.8 %
HCT: 44.1 % (ref 35.0–45.0)
Hemoglobin: 14.6 g/dL (ref 11.7–15.5)
Lymphs Abs: 2272 cells/uL (ref 850–3900)
MCH: 29.7 pg (ref 27.0–33.0)
MCHC: 33.1 g/dL (ref 32.0–36.0)
MCV: 89.8 fL (ref 80.0–100.0)
MPV: 9.8 fL (ref 7.5–12.5)
Monocytes Relative: 5.7 %
Neutro Abs: 4805 cells/uL (ref 1500–7800)
Neutrophils Relative %: 62.4 %
Platelets: 491 10*3/uL — ABNORMAL HIGH (ref 140–400)
RBC: 4.91 10*6/uL (ref 3.80–5.10)
RDW: 13 % (ref 11.0–15.0)
Total Lymphocyte: 29.5 %
WBC: 7.7 10*3/uL (ref 3.8–10.8)

## 2019-02-27 LAB — BASIC METABOLIC PANEL
BUN/Creatinine Ratio: 15 (calc) (ref 6–22)
BUN: 23 mg/dL (ref 7–25)
CO2: 26 mmol/L (ref 20–32)
Calcium: 10 mg/dL (ref 8.6–10.2)
Chloride: 97 mmol/L — ABNORMAL LOW (ref 98–110)
Creat: 1.54 mg/dL — ABNORMAL HIGH (ref 0.50–1.10)
Glucose, Bld: 151 mg/dL — ABNORMAL HIGH (ref 65–99)
Potassium: 4.5 mmol/L (ref 3.5–5.3)
Sodium: 137 mmol/L (ref 135–146)

## 2019-02-27 LAB — C-REACTIVE PROTEIN: CRP: 24 mg/L — ABNORMAL HIGH (ref <8.0)

## 2019-02-27 MED FILL — SIVEXTRO 200 MG TABS: 200 | 30 days supply | Qty: 30 | Fill #0

## 2019-02-28 ENCOUNTER — Encounter: Payer: Medicaid Other | Attending: Physician Assistant | Admitting: Physician Assistant

## 2019-02-28 ENCOUNTER — Other Ambulatory Visit: Payer: Self-pay

## 2019-02-28 ENCOUNTER — Telehealth: Payer: Self-pay | Admitting: Pharmacy Technician

## 2019-02-28 DIAGNOSIS — L97522 Non-pressure chronic ulcer of other part of left foot with fat layer exposed: Secondary | ICD-10-CM

## 2019-02-28 DIAGNOSIS — F17210 Nicotine dependence, cigarettes, uncomplicated: Secondary | ICD-10-CM | POA: Diagnosis not present

## 2019-02-28 DIAGNOSIS — E11621 Type 2 diabetes mellitus with foot ulcer: Secondary | ICD-10-CM | POA: Insufficient documentation

## 2019-02-28 DIAGNOSIS — L97526 Non-pressure chronic ulcer of other part of left foot with bone involvement without evidence of necrosis: Secondary | ICD-10-CM | POA: Diagnosis present

## 2019-02-28 DIAGNOSIS — Z794 Long term (current) use of insulin: Secondary | ICD-10-CM | POA: Diagnosis not present

## 2019-02-28 MED ORDER — SIVEXTRO 200 MG PO TABS: 1.0000 | ORAL_TABLET | Freq: Every day | ORAL | 0 refills | Status: DC

## 2019-02-28 NOTE — Progress Notes (Signed)
PATRIA, WARZECHA ([id number]) Visit Report for 02/28/2019 Chief Complaint Document Details Patient Name: [name], [name] Date of Service: 02/28/2019 2:45 PM Medical Record Number: [id number] Patient Account Number: [id number] Date of Birth/Sex: 1976/01/21 (43 y.o. F) Treating RN: Cornell Barman Primary Care Provider: Priscille Kluver Other Clinician: Referring Provider: Priscille Kluver Treating Provider/Extender: Melburn Hake, HOYT Weeks in Treatment: 29 Information Obtained from: Patient Chief Complaint Left great toe ulcer Electronic Signature(s) Signed: 02/28/2019 3:22:54 PM By: Worthy Keeler PA-C Entered By: Worthy Keeler on 02/28/2019 15:16:28 [name] ([id number]) -------------------------------------------------------------------------------- HPI Details Patient Name: Heather Dear A. Date of Service: 02/28/2019 2:45 PM Medical Record Number: [id number] Patient Account Number: [id number] Date of Birth/Sex: September 20, 1976 (43 y.o. F) Treating RN: Cornell Barman Primary Care Provider: Priscille Kluver Other Clinician: Referring Provider: Priscille Kluver Treating Provider/Extender: Melburn Hake, HOYT Weeks in Treatment: 29 History of Present Illness HPI Description: 01/18/18-She is here for initial evaluation of the left great toe ulcer. She is a poor historian in regards to timeframe in detail. She states approximately 4 weeks ago she lacerated her toe on something in the house. She followed up with her primary care who placed her on Bactrim and ultimately a second dose of Bactrim prior to coming to wound clinic. She states she has been treating the toe with peroxide, Betadine and a Band-Aid. She did not check her blood sugar this morning but checked it yesterday morning it was 327; she is unaware of a recent A1c and there are no current records. She saw Dr. she would've orthopedics last week for an old injury to the left ankle, she states he did not see her toe, nor did she bring it to his attention.  She smokes approximately 1 pack cigarettes a day. Her social situation is concerning, she arrives this morning with her mother who appears extremely intoxicated/under the influence; her mother was asked to leave the room and be monitored by the patient's grandmother. The patient's aunt then accompanied the patient and the room throughout the rest of the appointment. We had a lengthy discussion regarding the deleterious effects of uncontrolled hyperglycemia and smoking as it relates to wound healing and overall health. She was strongly encouraged to decrease her smoking and get her diabetes under better control. She states she is currently on a diet and has cut down her [facility] consumption. The left toe is erythematous, macerated and slightly edematous with malodor present. The edema in her left foot is below her baseline, there is no erythema streaking. We will treat her with Santyl, doxycycline; we have ordered and xray, culture and provided a Peg assist surgical shoe and cultured the wound. 01/25/18-She is here in follow-up evaluation for a left great toe ulcer and presents with an abscess to her suprapubic area. She states her blood sugars remain elevated, feeling "sick" and if levels are below 250, but she is trying. She has made no attempt to decrease her smoking stating that we "can't take away her food in her cigarettes". She has been compliant with offloading using the PEG assist you. She is using Santyl daily. the culture obtained last week grew staph aureus and Enterococcus faecalis; continues on the doxycycline and Augmentin was added on Monday. The suprapubic area has erythema, no femoral variation, purple discoloration, minimal induration, was accessed with a cotton tip applicator with sanguinopurulent drainage, this was cultured, I suspect the current antibiotic treatment will cover and we will not add anything to her current treatment plan. She was advised  to go to urgent care or ER  with any change in redness, induration or fever. 02/01/18-She is here in follow-up evaluation for left great toe ulcers and a new abdominal abscess from last week. She was able to use packing until earlier this week, where she "forgot it was there". She states she was feeling ill with GI symptoms last week and was not taking her antibiotic. She states her glucose levels have been predominantly less than 200, with occasional levels between 200-250. She thinks this was contributing to her GI symptoms as they have resolved without intervention. There continues to be significant laceration to left toe, otherwise it clinically looks stable/improved. There is now less superficial opening to the lateral aspect of the great toe that was residual blister. We will transition to [facility] to all wounds, she will continue her Augmentin. If there is no change or deterioration next week for reculture. 02/08/18-She is here in follow-up evaluation for left great toe ulcer and abdominal ulcer. There is an improvement in both wounds. She has been wrapping her left toe with coban, not by our direction, which has created an area of discoloration to the medial aspect; she has been advised to NOT use coban secondary to her neuropathy. She states her glucose levels have been high over this last week ranging from 200-350, she continues to smoke. She admits to being less compliant with her offloading shoe. We will continue with same treatment plan and she will follow-up next week. 02/15/18-She is here in follow-up evaluation for left great toe ulcer and abdominal ulcer. The abdominal ulcer is epithelialized. The left great toe ulcer is improved and all injury from last week using the Coban wrap is resolved, the lateral ulcer is healed. She admits to noncompliance with wearing offloading shoe and admits to glucose levels being greater than 300 most of the week. She continues to smoke and expresses no desire to quit. There  is one area medially that probes deeper than it has historically, erythema to the toe and dorsal foot has consistently waxed and waned. There is no overt signs of cellulitis or infection but we will culture the wound for any occult infection given the new area of depth and erythema. We will hold off on sensitivities for initiation of antibiotic therapy. 02/22/18-She is here in follow up evaluation for left great toe ulcer. There is overall significant improvement in both wound appearance, erythema and edema with changes made last week. She was not initiated on antibiotic therapy. Culture obtained last week showed oxacillin sensitive staph aureus, sensitive to clindamycin. Clindamycin has been called into the pharmacy but she has been instructed to hold off on initiation secondary to overall clinical improvement and her history of antibiotic intolerance. She has been instructed to contact the clinic with any noted changes/deterioration and the wound, erythema, Jeancharles, Kasie A. ([id number]) edema and/or pain. She will follow-up next week. She continues to smoke and her glucose levels remain elevated >250; she admits to compliance with offloading shoe 03/01/18 on evaluation today patient appears to be doing fairly well in regard to her left first toe ulcer. She has been tolerating the dressing changes with the [facility] Dressing without complication and overall this has definitely showed signs of improvement according to records as well is what the patient tells me today. I'm very pleased in that regard. She is having no pain today 03/08/18 She is here for follow up evaluation of a left great toe ulcer. She remains non-compliant with glucose control  and smoking cessation; glucose levels consistently >200. She states that she got new shoe inserts/peg assist. She admits to compliance with offloading. Since my last evaluation there is significant improvement. We will switch to prisma at this time  and she will follow up next week. She is noted to be tachycardic at this appointment, heart rate 120s; she has a history of heart rate 70-130 according to our records. She admits to extreme agitation r/t personal issues; she was advised to monitor her heartrate and contact her physician if it does not return to a more normal range (<100). She takes cardizem twice daily. 03/15/18-She is here in follow-up evaluation for left great toe ulcer. She remains noncompliant with glucose control and smoking cessation. She admits to compliance with wearing offloading shoe. The ulcer is improved/stable and we will continue with the same treatment plan and she will follow-up next week 03/22/18-She is here for evaluation for left great toe ulcer. There continues to be significant improvement despite recurrent hyperglycemia (over 500 yesterday) and she continues to smoke. She has been compliant with offloading and we will continue with same treatment plan and she will follow-up next week. 03/29/18-She is here for evaluation for left great toe ulcer. Despite continuing to smoke and uncontrolled diabetes she continues to improve. She is compliant with offloading shoe. We will continue with the same treatment plan and she will follow-up next week 04/05/18- She is here in follow up evaluation for a left great toe ulcer; she presents with small pustule to left fifth toe (resembles ant bite). She admits to compliance with wearing offloading shoe; continues to smoke or have uncontrolled blood glucose control. There is more callus than usual with evidence of bleeding; she denies known trauma. 04/12/18-She is here for evaluation of left great toe ulcer. Despite noncompliance with glycemic control and smoking she continues to make improvement. She continues to wear offloading shoe. The pustule, that was identified last week, to the left fifth toe is resolved. She will follow-up in 2 weeks 05/03/18-she is seen in follow-up  evaluation for a left great toe ulcer. She is compliant with offloading, otherwise noncompliant with glycemic control and smoking. She has plateaued and there is minimal improvement noted. We will transition to [facility], replaced the insert to her surgical shoe and she will follow-up in one week 05/10/18- She is here in follow up evaluation for a left great toe ulcer. It appears stable despite measurement change. We will continue with same treatment plan and follow up next week. 05/24/18-She is seen in follow-up evaluation for a left great toe ulcer. She remains compliant with offloading, has made significant improvement in her diet, decreasing the amount of sugar/soda. She said her recent A1c was 10.9 which is lower than. She did see a diabetic nutritionist/educator yesterday. She continues to smoke. We will continue with the same treatment plan and she'll follow-up next week. 05/31/18- She is seen in follow-up evaluation for left great toe ulcer. She continues to remain compliant with offloading, continues to make improvement in her diet, increasing her water and decreasing the amount of sugar/soda. She does continue to smoke with no desire to quit. We will apply Prisma to the depth and Hydrofera Blue over. We have not received insurance authorization for oasis. She will follow up next week. 06/07/18-She is seen in follow-up evaluation for left great toe ulcer. It has stalled according to today's measurements although base appears stable. She says she saw a diabetic educator yesterday; her average blood sugars are  less than 300 which is an improvement for her. She continues to smoke and states "that's my next step" She continues with water over soda. We will order for xray, culture and reinstate ace wrap compression prior to placing apligraf for next week. She is voicing no complaints or concerns. Her dressing will change to iodoflex over the next week in preparation for apligraf. 06/14/18-She is  seen in follow-up evaluation for left great toe ulcer. Plain film x-ray performed last week was negative for osteomyelitis. Wound culture obtained last week grew strep B and OSSA; she is initiated on keflex and cefdinir today; there is erythema to the toe which could be from ace wrap compression, she has a history of wrapping too tight and has has been encouraged to maintain ace wraps that we place today. We will hold off on application of apligraf today, will apply next week after antibiotic therapy has been initiated. She admits today that she has resumed taking a shower with her foot/toe submerged in water, she has been reminded to keep foot/toe out of the bath water. She will be seen in follow up next week 06/21/18-she is seen in follow-up evaluation for left great toe ulcer. She is tolerating antibiotic therapy with no GI disturbance. The wound is stable. Apligraf was applied today. She has been decreasing her smoking, only had 4 cigarettes yesterday and 1 today. She continues being more compliant in diabetic diet. She will follow-up next week for evaluation of site, if stable will remove at 2 weeks. 06/28/18- She is here in follow up evalution. Apligraf was placed last week, she states the dressing fell off on Tuesday and she was dressing with hydrofera blue. She is healed and will be discharged from the clinic today. She has been instructed to continue with smoking cessation, continue monitoring glucose levels, offloading for an additional 4 weeks and continue with Rijos, Korine A. ([id number]) hydrofera blue for additional two weeks for any possible microscopic opening. Readmission: 08/07/18 on evaluation today patient presents for reevaluation concerning the ulcer of her right great toe. She was previously discharged on 06/28/18 healed. Nonetheless she states that this began to show signs of drainage she subsequently went to her primary care provider. Subsequently an x-ray was performed on  08/01/18 which was negative. The patient was also placed on antibiotics at that time. Fortunately they should have been effective for the infection. Nonetheless she's been experiencing some improvement but still has a lot of drainage coming from the wound itself. 08/14/18 on evaluation today patient's wound actually does show signs of improvement in regard to the erythema at this point. She has completed the antibiotics. With that being said we did discuss the possibility of placing her in a total contact cast as of today although I think that I may want to give this just a little bit more time to ensure nothing recurrence as far as her infection is concerned. I do not want to put in the cast and risk infection at that time if things are not completely resolved. With that being said she is gonna require some debridement today. 08/21/18 on evaluation today patient actually appears to be doing okay in regard to her toe ulcer. She's been tolerating the dressing changes without complication. With that being said it does appear that she is ready and in fact I think it's appropriate for [place] to go ahead and initiate the total contact cast today. Nonetheless she will require some sharp debridement to prepare the wound for application. Overall  I feel like things have been progressing well but we do need to do something to get this to close more readily. 08/24/18 patient seen today for reevaluation after having had the total contact cast applied on Tuesday. She seems to have done very well the wound appears to be doing great and overall I'm pleased with the progress that she's made. There were no abnormal areas of rubbing from the cast on her lower extremity. 08/30/18 on evaluation today patient actually appears to be completely healed in regard to her plantar toe ulcer. She tells me at this point she's been having a lot of issues with the cast. She almost fell a couple of times the state shall the step of her  dog a couple times as well. This is been a very frustrating process for her other nonetheless she has completely healed the wound which is excellent news. Overall there does not appear to be the evidence of infection at this time which is great news. 09/11/18 evaluation today patient presents for follow-up concerning her great toe ulcer on the left which has unfortunately reopened since I last saw her which was only a couple of weeks ago. Unfortunately she was not able to get in to get the shoe and potentially the AFO that's gonna be necessary due to her left foot drop. She continues with offloading shoe but this is not enough to prevent her from reopening it appears. When we last had her in the total contact cast she did well from a healing standpoint but unfortunately the wound reopened as soon as she came out of the cast within just a couple of weeks. 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 This is also part of the reason why she falls on a regular basis. I just do not believe that is gonna be safe for [place] to reinitiate the total contact cast as last time we had this on she fell 3 times one week which is definitely not normal for her. 09/18/18 upon evaluation today the patient actually appears to be doing about the same in regard to her toe ulcer. She did not contact [employer] as I asked her to even though I had given her the prescription. In fact she actually states that she has no idea where the prescription is. She did apparently call [employer] and they told her that all she needed to do was bring the prescription in order to be able to be seen and work on getting the AFO for her left foot. With all that being said she still does not have an appointment and I'm not sure were things stand that regard.  I will give her a new prescription today in order to contact them to get this set up. 09/25/18 on evaluation today patient actually appears to be doing about the same in regard to her toes ulcer. She does have a small areas which seems to have a lot of callous buildup around the edge of the wound which is going to need sharp debridement today. She still is waiting to be scheduled for evaluation with [employer] for possibility of an AFO. She states there supposed to call her tomorrow to get this set up. Unfortunately it does appear that her foot specifically the toe area is showing signs  of erythema. There does not appear to be any systemic infection which is in these good news. 10/02/18 on evaluation today patient actually appears to be doing about the same in regard to her toe ulcer. This really has not done too well although it's not significantly larger it's also not significantly smaller. She has been tolerating the dressing changes without complication. She actually has her appointment with [employer] and [employer] tomorrow to hopefully be measured for obtaining and AFO splint. I think this would be helpful preventing this from reoccurring. We had contemplated starting the cast this week although to be honest I am reluctant to do that as she's been having nausea, vomiting, and seizure activity over the past three days. She has a history of seizures and have been told is nothing that can be done for these. With Bryk, [name] ([id number]) that being said I do believe that along with the seizures have the nausea vomiting which upon further questioning doesn't seem to be the normal for her and makes me concerned for the possibility of infection or something else going on. I discussed this with the patient and her mother during the office visit today. I do not feel the wound is effective but maybe something else. The responses this was "this just happens to her at times and we don't know why". They did  not seem to be interested in going to the hospital to have this checked out further. 10/09/18 on evaluation today patient presents for follow-up concerning her ongoing toe ulcer. She has been tolerating the dressing changes without complication. Fortunately there does not appear to be any evidence of infection which is great news however I do think that the patient would benefit from going ahead for with the total contact cast. She's actually in a wheelchair today she tells me that she will use her walker if we initiate the cast. I was very specific about the fact that if we were gonna do the cast I wanted to make sure that she was using the walker in order to prevent any falls. She tells me she does not have stairs that she has to traverse on a regular basis at her home. She has not had any seizures since last week again that something that happens to her often she tells me she did talk to [employer] and they said that it may take up to three weeks to get the brace approved for her. Hopefully that will not take that long but nonetheless in the meantime I do think the cast could be of benefit. 10/12/18 on evaluation today patient appears to be doing rather well in regard to her toe ulcer. It's just been a few days and already this is significantly improved both as far as overall appearance and size. Fortunately there's no sign of infection. She is here for her first obligatory cast change. 10/19/18 Seen today for follow up and management of left great toe ulcer. Wound continues to show improvement. Noted small open area with seroussang drainage with palpation. Denies any increased pain or recent fevers during visit. She will continue calcium alginate with offloading shoe. Denies any questions or concerns during visit. 10/26/18 on evaluation today patient appears to be doing about the same as when I last saw her in regard to her wound bed. Fortunately there does not appear to be any signs of infection.  Unfortunately she continues to have a breakdown in regard to the toe region any time that she is not in the cast.  It takes almost no time at all for this to happen. Nonetheless she still has not heard anything from the brace being made by [employer] as to when exactly this will be available to her. Fortunately there is no signs of infection at this time. 10/30/18 on evaluation today patient presents for application of the total contact cast as we just received him this morning. Fortunately we are gonna be able to apply this to her today which is great news. She continues to have no significant pain which is good news. Overall I do feel like things have been improving while she was the cast is when she doesn't have a cast that things get worse. She still has not really heard anything from [place] regarding her brace. 11/02/18 upon evaluation today patient's wound already appears to be doing significantly better which is good news. Fortunately there does not appear to be any signs of infection also good news. Overall I do think the total contact cast as before is helping to heal this area unfortunately it's just not gonna likely keep the area closed and healed without her getting her brace at least. Again the foot drop is a significant issue for her. 11/09/18 on evaluation today patient appears to be doing excellent in regard to her toe ulcer which in fact is completely healed. Fortunately we finally got the situation squared away with the paperwork which was needed to proceed with getting her brace approved by Medicaid. I have filled that out unfortunately that information has been sent to the orthopedic office that I worked at 2 1/2 years ago and not tired Current wound care measures. Fortunately she seems to be doing very well at this time. 11/23/18 on evaluation today patient appears to be doing More Poorly Compared to Last Time I Saw Her. At [facility] She Had Completely Healed. Currently she is  continuing to have issues with reopening. She states that she just found out that the brace was approved through Medicaid now she just has to go get measured in order to have this fitted for her and then made. Subsequently she does not have an appointment for this yet that is going to complicate things we obviously cannot put her back in the cast if we do not have everything measured because they're not gonna be able to measure her foot while she is in the cast. 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 Obviously this is unfortunate and does have me somewhat worried as well. 11/30/18 on evaluation today patient's toe ulcer actually appears to be doing fairly well. The good news is she will be getting her brace in the shoes next week on Wednesday. Hopefully we will be able to get this to heal without having to go back in the cast however she may need the cast in order to get the wound completely heal and then go from there. Fortunately there's no signs of infection at this time. 12/07/18 on evaluation today patient fortunately did receive her brace and she states she could tell this definitely makes her walk better. With that being said she's been having issues with her toe where she noticed yesterday there was a lot of tissue [name], [name] ([id number]) that was loosing off this appears to be much larger than what it was previous. She also states that her leg has been read putting much across the top of  her foot just about the ankle although this seems to be receiving somewhat. The total area is still red and appears to be someone infected as best I can tell. She is previously taken Bactrim and that may be a good option for her today as well. We are gonna see what I wound culture shows as well and I think that this is definitely appropriate. With that being said outside of the culture I still need to  initiate something in the interim and that's what I'm gonna go ahead and select Bactrim is a good option for her. 12/14/18 on evaluation today patient appears to be doing better in regard to her left great toe ulcer as compared to last week's evaluation. There's still some erythema although this is significantly improved which is excellent news. Overall I do believe that she is making good progress is still gonna take some time before she is where I would like her to be from the standpoint of being able to place her back into the total contact cast. Hopefully we will be where we need to be by next week. 12/21/18 on evaluation today patient actually appears to be doing poorly in regard to her toe ulcer. She's been tolerating the dressing changes without complication. Fortunately there's no signs of systemic infection although she does have a lot of drainage from the toe ulcer and this does seem to be causing some issues at this point. She does have erythema on the distal portion of her toe that appears to be likely cellulitis. 12/28/18 on evaluation today patient actually appears to be doing a little better in my pinion in regard to her toe ulcer. With that being said she still does have some evidence of infection at this time and for her culture she had both E. coli as well as enterococcus as organisms noted on evaluation. For that reason I think that though the Keflex likely has treated the E. coli rather well this has really done nothing for the enterococcus. We are going to have to initiate treatment for this specifically. 01/04/19 on evaluation today patient's toe actually appears to be doing better from the standpoint of infection. She currently would like to see about putting the cash back on I think that this is appropriate as long as she takes care of it and keeps it from getting wet. She is gonna have some drainage we can definitely pass this up with Drawtex and alginate to try to prevent as  much drainage as possible from causing the problems. With that being said I do want to at least try her with the cast between now and Tuesday. If there any issues we can't continue to use it then I will discontinue the use of the cast at that point. 01/08/19 on evaluation today patient actually appears to be doing very well as far as her foot ulcer specifically the great toe on the left is concerned. She did have an area of rubbing on the medial aspect of her left ankle which again is from the cast. Fortunately there's no signs of infection at this point in this appears to be a very slight skin breakdown. The patient tells me she felt it rubbing but didn't think it was that bad. Fortunately there is no signs of active infection at this time which is good news. No fevers, chills, nausea, or vomiting noted at this time. 01/15/19 on evaluation today patient actually appears to be doing well in regard to her toe ulcer. Again as  previous she seems to do well and she has the cast on which indicates to me that during the time she doesn't have a cast on she's putting way too much pressure on this region. Obviously I think that's gonna be an issue as with the current national emergency concerning the Covid-19 Virus it has been recommended that we discontinue the use of total contact casting by the chief medical officer of our company, Dr. Simona Huh. The reasoning is that if a patient becomes sick and cannot come into have the cast removed they could not just leave this on for an additional two weeks. Obviously the hospitals also do not want to receive patient's who are sick into the emergency department to potentially contaminate the region and spread the Covid-19 Virus among other sick individuals within the hospital system. Therefore at this point we are suspending the use of total contact cast until the current emergency subsides. This was all discussed with the patient today as well. 01/22/19 on evaluation  today patient's wound on her left great toe appears to be doing slightly worse than previously noted last week. She tells me that she has been on this quite a bit in fact she tells me she's been awake for 38 straight hours. This is due to the fact that she's having to care for grandparents because nobody else will. She has been taking care of them for five the last seven days since I've seen her they both have dementia his is from a stroke and her grandmother's was progressive. Nonetheless she states even her mom who knows her condition and situation has only help two of those days to take care of them she's been taking care of the rest. Fortunately there does not appear to be any signs of active infection in regard to her toe at this point although obviously it doesn't look as good as it did previous. I think this is directly related to her not taking off the pressure and friction by way of taking things easy. Though I completely understand what's going on. 01/29/19 on evaluation today patient's tools are actually appears to be showing some signs of improvement today compared to last week's evaluation as far as not necessarily the overall size of the wound but the fact that she has some new skin growth in between the two ends of the wound opening. Overall I feel like she has done well she states that she had a family member give her what sounds to be a CAM walker boot which has been helpful as well. 02/05/19 on evaluation today patient's wound bed actually appears to be doing significantly better in regard to her overall Halteman, Barbette A. ([id number]) appearance of the size of the wound. With that being said she is still having an issue with offloading efficiently enough to get this to close. Apparently there is some signs of infection at this point as well unfortunately. Previously she's done well of Augmentin I really do not see anything that needs to be culture currently but there theme and  cellulitis of the foot that I'm seeing I'm gonna go ahead and place her on an antibiotic today to try to help clear this up. 02/12/2019 on evaluation today patient actually appears to be doing poorly in regard to her overall wound status. She tells me she has been using her offloading shoe but actually comes in today wearing her tennis shoe with the AFO brace. Again as I previously discussed with her this is really not sufficient  to allow the area to heal appropriately. Nonetheless she continues to be somewhat noncompliant and I do wonder based on what she has told my nurse in the past as to whether or not a good portion of this noncompliance may be recreational drug and alcohol related. She has had a history of heroin overdose and this was fairly recently in the past couple of months that have been seeing her. Nonetheless overall I feel like her wound looks significantly worse today compared to what it was previous. She still has significant erythema despite the Augmentin I am not sure that this is an appropriate medication for her infection I am also concerned that the infection may have gone down into her bone. 02/19/19 on evaluation today patient actually appears to be doing about the same in regard to her toe ulcer. Unfortunately she continues to show signs of bone exposure and infection at this point. There does not appear to be any evidence of worsening of the infection but I'm also not really sure that it's getting significantly better. She is on the Augmentin which should be sufficient for the Staphylococcus aureus infection that she has at this point. With that being said she may need IV antibiotics to more appropriately treat this. We did have a discussion today about hyperbaric option therapy. 02/28/19 on evaluation today patient actually appears to be doing much worse in regard to the wound on her left great toe as compared to even my previous evaluation last week. Unfortunately this seems  to be training in a pretty poor direction. Her toe was actually now starting to angle laterally and I can actually see the entire joint area of the proximal portion of the digit where is the distal portion of the digit again is no longer even in contact with the joint line. Unfortunately there's a lot more necrotic tissue around the edge and the toe appears to be showing signs of becoming gangrenous in my pinion. I'm very concerned about were things stand at this point. She did see infectious disease and they are planning to send in a prescription for Sivextro for her and apparently this has been approved. With that being said I don't think she should avoid taking this but at the same time I'm not sure that it's gonna be sufficient to save her toe at this point. 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 Electronic Signature(s) Signed: 02/28/2019 3:22:54 PM By: Worthy Keeler PA-C Entered By: Worthy Keeler on 02/28/2019 15:18:39 [name] ([id number]) -------------------------------------------------------------------------------- Physical Exam Details Patient Name: Heather Dear A. Date of Service: 02/28/2019 2:45 PM Medical Record Number: [id number] Patient Account Number: [id number] Date of Birth/Sex: [birth year] (43 y.o. F) Treating RN: Cornell Barman Primary Care Provider: Priscille Kluver Other Clinician: Referring Provider: Priscille Kluver Treating Provider/Extender: STONE III, HOYT Weeks in Treatment: 49 Constitutional Well-nourished and well-hydrated in no acute distress. Respiratory normal breathing without difficulty. clear to auscultation bilaterally. Cardiovascular regular rate and rhythm with normal S1, S2. Psychiatric this patient is able to make decisions and demonstrates  good insight into disease process. Alert and Oriented x 3. pleasant and cooperative. Notes Upon inspection today patient's wound bed actually shows evidence of upon material around the edge of the wound. Unfortunately she also has exposure of the  joint space itself and again the tone was starting to angle towards the lateral aspect completely exposing the joint line and it is completely disconnected on the medial aspect unfortunately. Overall coupled with the osteomyelitis that we've already noted I'm very concerned that she also seems to be developing some degree of gangrene which likely is due to the fact that she's losing a significant portion of the connective tissue and possibly the blood supply to the distal portion of this toe. I think that she may need to strongly consider amputation based on what I'm seeing at this point definitely I think smoking cessation offloading are important but again I think she may be too far past the point of return at this time. Electronic Signature(s) Signed: 02/28/2019 3:22:54 PM By: Worthy Keeler PA-C Entered By: Worthy Keeler on 02/28/2019 15:19:46 [name] ([id number]) -------------------------------------------------------------------------------- Physician Orders Details Patient Name: Heather Dear A. Date of Service: 02/28/2019 2:45 PM Medical Record Number: [id number] Patient Account Number: [id number] Date of Birth/Sex: 1976-10-18 (43 y.o. F) Treating RN: Cornell Barman Primary Care Provider: Priscille Kluver Other Clinician: Referring Provider: Priscille Kluver Treating Provider/Extender: Melburn Hake, HOYT Weeks in Treatment: 20 Verbal / Phone Orders: No Diagnosis Coding Wound Cleansing Wound #5 Left Toe Great o Clean wound with Normal Saline. Anesthetic (add to Medication List) Wound #5 Left Toe Great o Topical Lidocaine 4% cream applied to wound bed prior to debridement (In Clinic Only). Primary Wound Dressing Wound #5 Left Toe  Great o Silver Alginate Secondary Dressing Wound #5 Left Toe Great o Conform/Kerlix o Foam Dressing Change Frequency Wound #5 Left Toe Great o Three times weekly Follow-up Appointments Wound #5 Left Toe Great o Return Appointment in 1 week. Off-Loading Wound #5 Left Toe Great o Open toe surgical shoe to: - Wear your surgical shoe with your brace. No tennis Shoes! Additional Orders / Instructions Wound #5 Left Toe Great o Increase protein intake. Medications-please add to medication list. Wound #5 Left Toe Great o P.O. Antibiotics Electronic Signature(s) Signed: 02/28/2019 3:22:54 PM By: Rogene Houston ([id number]) Signed: 02/28/2019 5:18:06 PM By: Gretta Cool, BSN, RN, CWS, [name] RN, BSN Entered By: Gretta Cool, BSN, RN, CWS, [name] on 02/28/2019 15:09:23 [name] ([id number]) -------------------------------------------------------------------------------- Problem List Details Patient Name: Heather Dear A. Date of Service: 02/28/2019 2:45 PM Medical Record Number: [id number] Patient Account Number: [id number] Date of Birth/Sex: 03-Feb-1976 (43 y.o. F) Treating RN: Cornell Barman Primary Care Provider: Priscille Kluver Other Clinician: Referring Provider: Priscille Kluver Treating Provider/Extender: Melburn Hake, HOYT Weeks in Treatment: 29 Active Problems ICD-10 Evaluated Encounter Code Description Active Date Today Diagnosis E11.621 Type 2 diabetes mellitus with foot ulcer 08/07/2018 No Yes L97.522 Non-pressure chronic ulcer of other part of left foot with fat 08/08/2018 No Yes layer exposed M21.372 Foot drop, left foot 09/11/2018 No Yes M86.672 Other chronic osteomyelitis, left ankle and foot 02/19/2019 No Yes Inactive Problems Resolved Problems ICD-10 Code Description Active Date Resolved Date L03.032 Cellulitis of left toe 08/07/2018 08/07/2018 Electronic Signature(s) Signed: 02/28/2019 3:22:54 PM By: Worthy Keeler PA-C Entered By: Worthy Keeler on  02/28/2019 15:16:20 [name], [name] ([id number]) -------------------------------------------------------------------------------- Progress Note Details Patient Name: Heather Dear A. Date of Service: 02/28/2019 2:45 PM Medical Record Number: [id number] Patient Account Number: [id number] Date of Birth/Sex: 13-May-1976 (43 y.o. F) Treating RN: Cornell Barman Primary Care Provider: Priscille Kluver Other Clinician: Referring Provider: Priscille Kluver Treating Provider/Extender: Melburn Hake, HOYT Weeks in Treatment: 29 Subjective Chief Complaint Information obtained from Patient  Left great toe ulcer History of Present Illness (HPI) 01/18/18-She is here for initial evaluation of the left great toe ulcer. She is a poor historian in regards to timeframe in detail. She states approximately 4 weeks ago she lacerated her toe on something in the house. She followed up with her primary care who placed her on Bactrim and ultimately a second dose of Bactrim prior to coming to wound clinic. She states she has been treating the toe with peroxide, Betadine and a Band-Aid. She did not check her blood sugar this morning but checked it yesterday morning it was 327; she is unaware of a recent A1c and there are no current records. She saw Dr. she would've orthopedics last week for an old injury to the left ankle, she states he did not see her toe, nor did she bring it to his attention. She smokes approximately 1 pack cigarettes a day. Her social situation is concerning, she arrives this morning with her mother who appears extremely intoxicated/under the influence; her mother was asked to leave the room and be monitored by the patient's grandmother. The patient's aunt then accompanied the patient and the room throughout the rest of the appointment. We had a lengthy discussion regarding the deleterious effects of uncontrolled hyperglycemia and smoking as it relates to wound healing and overall health. She was strongly encouraged  to decrease her smoking and get her diabetes under better control. She states she is currently on a diet and has cut down her [facility] consumption. The left toe is erythematous, macerated and slightly edematous with malodor present. The edema in her left foot is below her baseline, there is no erythema streaking. We will treat her with Santyl, doxycycline; we have ordered and xray, culture and provided a Peg assist surgical shoe and cultured the wound. 01/25/18-She is here in follow-up evaluation for a left great toe ulcer and presents with an abscess to her suprapubic area. She states her blood sugars remain elevated, feeling "sick" and if levels are below 250, but she is trying. She has made no attempt to decrease her smoking stating that we "can't take away her food in her cigarettes". She has been compliant with offloading using the PEG assist you. She is using Santyl daily. the culture obtained last week grew staph aureus and Enterococcus faecalis; continues on the doxycycline and Augmentin was added on Monday. The suprapubic area has erythema, no femoral variation, purple discoloration, minimal induration, was accessed with a cotton tip applicator with sanguinopurulent drainage, this was cultured, I suspect the current antibiotic treatment will cover and we will not add anything to her current treatment plan. She was advised to go to urgent care or ER with any change in redness, induration or fever. 02/01/18-She is here in follow-up evaluation for left great toe ulcers and a new abdominal abscess from last week. She was able to use packing until earlier this week, where she "forgot it was there". She states she was feeling ill with GI symptoms last week and was not taking her antibiotic. She states her glucose levels have been predominantly less than 200, with occasional levels between 200-250. She thinks this was contributing to her GI symptoms as they have resolved without intervention.  There continues to be significant laceration to left toe, otherwise it clinically looks stable/improved. There is now less superficial opening to the lateral aspect of the great toe that was residual blister. We will transition to [facility] to all wounds, she will continue her Augmentin.  If there is no change or deterioration next week for reculture. 02/08/18-She is here in follow-up evaluation for left great toe ulcer and abdominal ulcer. There is an improvement in both wounds. She has been wrapping her left toe with coban, not by our direction, which has created an area of discoloration to the medial aspect; she has been advised to NOT use coban secondary to her neuropathy. She states her glucose levels have been high over this last week ranging from 200-350, she continues to smoke. She admits to being less compliant with her offloading shoe. We will continue with same treatment plan and she will follow-up next week. 02/15/18-She is here in follow-up evaluation for left great toe ulcer and abdominal ulcer. The abdominal ulcer is epithelialized. The left great toe ulcer is improved and all injury from last week using the Coban wrap is resolved, the lateral ulcer is healed. She admits to noncompliance with wearing offloading shoe and admits to glucose levels being greater than 300 most of the week. She continues to smoke and expresses no desire to quit. There is one area medially that probes deeper than it has historically, erythema to the toe and dorsal foot has consistently waxed and waned. There is no overt signs of cellulitis or infection but we will culture the wound for any occult infection given the new area of depth and erythema. We will hold off on Ottaviano, Debria A. ([id number]) sensitivities for initiation of antibiotic therapy. 02/22/18-She is here in follow up evaluation for left great toe ulcer. There is overall significant improvement in both wound appearance, erythema and edema with  changes made last week. She was not initiated on antibiotic therapy. Culture obtained last week showed oxacillin sensitive staph aureus, sensitive to clindamycin. Clindamycin has been called into the pharmacy but she has been instructed to hold off on initiation secondary to overall clinical improvement and her history of antibiotic intolerance. She has been instructed to contact the clinic with any noted changes/deterioration and the wound, erythema, edema and/or pain. She will follow-up next week. She continues to smoke and her glucose levels remain elevated >250; she admits to compliance with offloading shoe 03/01/18 on evaluation today patient appears to be doing fairly well in regard to her left first toe ulcer. She has been tolerating the dressing changes with the [facility] Dressing without complication and overall this has definitely showed signs of improvement according to records as well is what the patient tells me today. I'm very pleased in that regard. She is having no pain today 03/08/18 She is here for follow up evaluation of a left great toe ulcer. She remains non-compliant with glucose control and smoking cessation; glucose levels consistently >200. She states that she got new shoe inserts/peg assist. She admits to compliance with offloading. Since my last evaluation there is significant improvement. We will switch to prisma at this time and she will follow up next week. She is noted to be tachycardic at this appointment, heart rate 120s; she has a history of heart rate 70-130 according to our records. She admits to extreme agitation r/t personal issues; she was advised to monitor her heartrate and contact her physician if it does not return to a more normal range (<100). She takes cardizem twice daily. 03/15/18-She is here in follow-up evaluation for left great toe ulcer. She remains noncompliant with glucose control and smoking cessation. She admits to compliance with wearing  offloading shoe. The ulcer is improved/stable and we will continue with the same treatment  plan and she will follow-up next week 03/22/18-She is here for evaluation for left great toe ulcer. There continues to be significant improvement despite recurrent hyperglycemia (over 500 yesterday) and she continues to smoke. She has been compliant with offloading and we will continue with same treatment plan and she will follow-up next week. 03/29/18-She is here for evaluation for left great toe ulcer. Despite continuing to smoke and uncontrolled diabetes she continues to improve. She is compliant with offloading shoe. We will continue with the same treatment plan and she will follow-up next week 04/05/18- She is here in follow up evaluation for a left great toe ulcer; she presents with small pustule to left fifth toe (resembles ant bite). She admits to compliance with wearing offloading shoe; continues to smoke or have uncontrolled blood glucose control. There is more callus than usual with evidence of bleeding; she denies known trauma. 04/12/18-She is here for evaluation of left great toe ulcer. Despite noncompliance with glycemic control and smoking she continues to make improvement. She continues to wear offloading shoe. The pustule, that was identified last week, to the left fifth toe is resolved. She will follow-up in 2 weeks 05/03/18-she is seen in follow-up evaluation for a left great toe ulcer. She is compliant with offloading, otherwise noncompliant with glycemic control and smoking. She has plateaued and there is minimal improvement noted. We will transition to [facility], replaced the insert to her surgical shoe and she will follow-up in one week 05/10/18- She is here in follow up evaluation for a left great toe ulcer. It appears stable despite measurement change. We will continue with same treatment plan and follow up next week. 05/24/18-She is seen in follow-up evaluation for a left great toe  ulcer. She remains compliant with offloading, has made significant improvement in her diet, decreasing the amount of sugar/soda. She said her recent A1c was 10.9 which is lower than. She did see a diabetic nutritionist/educator yesterday. She continues to smoke. We will continue with the same treatment plan and she'll follow-up next week. 05/31/18- She is seen in follow-up evaluation for left great toe ulcer. She continues to remain compliant with offloading, continues to make improvement in her diet, increasing her water and decreasing the amount of sugar/soda. She does continue to smoke with no desire to quit. We will apply Prisma to the depth and Hydrofera Blue over. We have not received insurance authorization for oasis. She will follow up next week. 06/07/18-She is seen in follow-up evaluation for left great toe ulcer. It has stalled according to today's measurements although base appears stable. She says she saw a diabetic educator yesterday; her average blood sugars are less than 300 which is an improvement for her. She continues to smoke and states "that's my next step" She continues with water over soda. We will order for xray, culture and reinstate ace wrap compression prior to placing apligraf for next week. She is voicing no complaints or concerns. Her dressing will change to iodoflex over the next week in preparation for apligraf. 06/14/18-She is seen in follow-up evaluation for left great toe ulcer. Plain film x-ray performed last week was negative for osteomyelitis. Wound culture obtained last week grew strep B and OSSA; she is initiated on keflex and cefdinir today; there is erythema to the toe which could be from ace wrap compression, she has a history of wrapping too tight and has has been encouraged to maintain ace wraps that we place today. We will hold off on application of apligraf  today, will apply next week after antibiotic therapy has been initiated. She admits today that she  has resumed taking a shower with her foot/toe submerged in water, she has been reminded to keep foot/toe out of the bath water. She will be seen in follow up next week 06/21/18-she is seen in follow-up evaluation for left great toe ulcer. She is tolerating antibiotic therapy with no GI disturbance. The wound is stable. Apligraf was applied today. She has been decreasing her smoking, only had 4 cigarettes yesterday and [name], [name] ([id number]) 1 today. She continues being more compliant in diabetic diet. She will follow-up next week for evaluation of site, if stable will remove at 2 weeks. 06/28/18- She is here in follow up evalution. Apligraf was placed last week, she states the dressing fell off on Tuesday and she was dressing with hydrofera blue. She is healed and will be discharged from the clinic today. She has been instructed to continue with smoking cessation, continue monitoring glucose levels, offloading for an additional 4 weeks and continue with hydrofera blue for additional two weeks for any possible microscopic opening. Readmission: 08/07/18 on evaluation today patient presents for reevaluation concerning the ulcer of her right great toe. She was previously discharged on 06/28/18 healed. Nonetheless she states that this began to show signs of drainage she subsequently went to her primary care provider. Subsequently an x-ray was performed on 08/01/18 which was negative. The patient was also placed on antibiotics at that time. Fortunately they should have been effective for the infection. Nonetheless she's been experiencing some improvement but still has a lot of drainage coming from the wound itself. 08/14/18 on evaluation today patient's wound actually does show signs of improvement in regard to the erythema at this point. She has completed the antibiotics. With that being said we did discuss the possibility of placing her in a total contact cast as of today although I think that I may  want to give this just a little bit more time to ensure nothing recurrence as far as her infection is concerned. I do not want to put in the cast and risk infection at that time if things are not completely resolved. With that being said she is gonna require some debridement today. 08/21/18 on evaluation today patient actually appears to be doing okay in regard to her toe ulcer. She's been tolerating the dressing changes without complication. With that being said it does appear that she is ready and in fact I think it's appropriate for [place] to go ahead and initiate the total contact cast today. Nonetheless she will require some sharp debridement to prepare the wound for application. Overall I feel like things have been progressing well but we do need to do something to get this to close more readily. 08/24/18 patient seen today for reevaluation after having had the total contact cast applied on Tuesday. She seems to have done very well the wound appears to be doing great and overall I'm pleased with the progress that she's made. There were no abnormal areas of rubbing from the cast on her lower extremity. 08/30/18 on evaluation today patient actually appears to be completely healed in regard to her plantar toe ulcer. She tells me at this point she's been having a lot of issues with the cast. She almost fell a couple of times the state shall the step of her dog a couple times as well. This is been a very frustrating process for her other nonetheless she has completely healed  the wound which is excellent news. Overall there does not appear to be the evidence of infection at this time which is great news. 09/11/18 evaluation today patient presents for follow-up concerning her great toe ulcer on the left which has unfortunately reopened since I last saw her which was only a couple of weeks ago. Unfortunately she was not able to get in to get the shoe and potentially the AFO that's gonna be necessary due to  her left foot drop. She continues with offloading shoe but this is not enough to prevent her from reopening it appears. When we last had her in the total contact cast she did well from a healing standpoint but unfortunately the wound reopened as soon as she came out of the cast within just a couple of weeks. 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 This is also part of the reason why she falls on a regular basis. I just do not believe that is gonna be safe for [place] to reinitiate the total contact cast as last time we had this on she fell 3 times one week which is definitely not normal for her. 09/18/18 upon evaluation today the patient actually appears to be doing about the same in regard to her toe ulcer. She did not contact [employer] as I asked her to even though I had given her the prescription. In fact she actually states that she has no idea where the prescription is. She did apparently call [employer] and they told her that all she needed to do was bring the prescription in order to be able to be seen and work on getting the AFO for her left foot. With all that being said she still does not have an appointment and I'm not sure were things stand that regard. I will give her a new prescription today in order to contact them to get this set up. 09/25/18 on evaluation today patient actually appears to be doing about the same in regard to her toes ulcer. She does have a small areas which seems to have a lot of callous buildup around the edge of the wound which is going to need sharp debridement today. She still is waiting to be scheduled for evaluation with [employer] for possibility of an AFO. She states there supposed to call her tomorrow to get this set up. Unfortunately it does appear that her foot  specifically the toe area is showing signs of erythema. There does not appear to be any systemic infection which is in these good news. [name], [name] ([id number]) 10/02/18 on evaluation today patient actually appears to be doing about the same in regard to her toe ulcer. This really has not done too well although it's not significantly larger it's also not significantly smaller. She has been tolerating the dressing changes without complication. She actually has her appointment with [employer] and [employer] tomorrow to hopefully be measured for obtaining and AFO splint. I think this would be helpful preventing this from reoccurring. We had contemplated starting the cast this week although to be honest I am reluctant to do that as she's been having nausea, vomiting, and seizure activity over the past three days. She has a history of seizures and have been told is nothing that can be done for these.  With that being said I do believe that along with the seizures have the nausea vomiting which upon further questioning doesn't seem to be the normal for her and makes me concerned for the possibility of infection or something else going on. I discussed this with the patient and her mother during the office visit today. I do not feel the wound is effective but maybe something else. The responses this was "this just happens to her at times and we don't know why". They did not seem to be interested in going to the hospital to have this checked out further. 10/09/18 on evaluation today patient presents for follow-up concerning her ongoing toe ulcer. She has been tolerating the dressing changes without complication. Fortunately there does not appear to be any evidence of infection which is great news however I do think that the patient would benefit from going ahead for with the total contact cast. She's actually in a wheelchair today she tells me that she will use her walker if we initiate the cast. I was very  specific about the fact that if we were gonna do the cast I wanted to make sure that she was using the walker in order to prevent any falls. She tells me she does not have stairs that she has to traverse on a regular basis at her home. She has not had any seizures since last week again that something that happens to her often she tells me she did talk to [employer] and they said that it may take up to three weeks to get the brace approved for her. Hopefully that will not take that long but nonetheless in the meantime I do think the cast could be of benefit. 10/12/18 on evaluation today patient appears to be doing rather well in regard to her toe ulcer. It's just been a few days and already this is significantly improved both as far as overall appearance and size. Fortunately there's no sign of infection. She is here for her first obligatory cast change. 10/19/18 Seen today for follow up and management of left great toe ulcer. Wound continues to show improvement. Noted small open area with seroussang drainage with palpation. Denies any increased pain or recent fevers during visit. She will continue calcium alginate with offloading shoe. Denies any questions or concerns during visit. 10/26/18 on evaluation today patient appears to be doing about the same as when I last saw her in regard to her wound bed. Fortunately there does not appear to be any signs of infection. Unfortunately she continues to have a breakdown in regard to the toe region any time that she is not in the cast. It takes almost no time at all for this to happen. Nonetheless she still has not heard anything from the brace being made by [employer] as to when exactly this will be available to her. Fortunately there is no signs of infection at this time. 10/30/18 on evaluation today patient presents for application of the total contact cast as we just received him this morning. Fortunately we are gonna be able to apply this to her today which is  great news. She continues to have no significant pain which is good news. Overall I do feel like things have been improving while she was the cast is when she doesn't have a cast that things get worse. She still has not really heard anything from [place] regarding her brace. 11/02/18 upon evaluation today patient's wound already appears to be doing significantly better which is good  news. Fortunately there does not appear to be any signs of infection also good news. Overall I do think the total contact cast as before is helping to heal this area unfortunately it's just not gonna likely keep the area closed and healed without her getting her brace at least. Again the foot drop is a significant issue for her. 11/09/18 on evaluation today patient appears to be doing excellent in regard to her toe ulcer which in fact is completely healed. Fortunately we finally got the situation squared away with the paperwork which was needed to proceed with getting her brace approved by Medicaid. I have filled that out unfortunately that information has been sent to the orthopedic office that I worked at 2 1/2 years ago and not tired Current wound care measures. Fortunately she seems to be doing very well at this time. 11/23/18 on evaluation today patient appears to be doing More Poorly Compared to Last Time I Saw Her. At [facility] She Had Completely Healed. Currently she is continuing to have issues with reopening. She states that she just found out that the brace was approved through Medicaid now she just has to go get measured in order to have this fitted for her and then made. Subsequently she does not have an appointment for this yet that is going to complicate things we obviously cannot put her back in the cast if we do not have everything measured because they're not gonna be able to measure her foot while she is in the cast. 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 Obviously this is unfortunate and does have me somewhat worried as well. 11/30/18 on evaluation today patient's toe ulcer actually appears to be doing fairly well. The good news is she will be getting her Kiernan, Joanette A. ([id number]) brace in the shoes next week on Wednesday. Hopefully we will be able to get this to heal without having to go back in the cast however she may need the cast in order to get the wound completely heal and then go from there. Fortunately there's no signs of infection at this time. 12/07/18 on evaluation today patient fortunately did receive her brace and she states she could tell this definitely makes her walk better. With that being said she's been having issues with her toe where she noticed yesterday there was a lot of tissue that was loosing off this appears to be much larger than what it was previous. She also states that her leg has been read putting much across the top of her foot just about the ankle although this seems to be receiving somewhat. The total area is still red and appears to be someone infected as best I can tell. She is previously taken Bactrim and that may be a good option for her today as well. We are gonna see what I wound culture shows as well and I think that this is definitely appropriate. With that being said outside of the culture I still need to initiate something in the interim and that's what I'm gonna go ahead and select Bactrim is a good option for her. 12/14/18 on evaluation today patient appears to be doing better in regard to her left great toe ulcer as compared to last week's evaluation. There's still some erythema although this is significantly improved which is excellent news. Overall I do believe that she is making good  progress is still gonna take some time before she is where I would like her to be from the standpoint of being able to place her back into the total contact  cast. Hopefully we will be where we need to be by next week. 12/21/18 on evaluation today patient actually appears to be doing poorly in regard to her toe ulcer. She's been tolerating the dressing changes without complication. Fortunately there's no signs of systemic infection although she does have a lot of drainage from the toe ulcer and this does seem to be causing some issues at this point. She does have erythema on the distal portion of her toe that appears to be likely cellulitis. 12/28/18 on evaluation today patient actually appears to be doing a little better in my pinion in regard to her toe ulcer. With that being said she still does have some evidence of infection at this time and for her culture she had both E. coli as well as enterococcus as organisms noted on evaluation. For that reason I think that though the Keflex likely has treated the E. coli rather well this has really done nothing for the enterococcus. We are going to have to initiate treatment for this specifically. 01/04/19 on evaluation today patient's toe actually appears to be doing better from the standpoint of infection. She currently would like to see about putting the cash back on I think that this is appropriate as long as she takes care of it and keeps it from getting wet. She is gonna have some drainage we can definitely pass this up with Drawtex and alginate to try to prevent as much drainage as possible from causing the problems. With that being said I do want to at least try her with the cast between now and Tuesday. If there any issues we can't continue to use it then I will discontinue the use of the cast at that point. 01/08/19 on evaluation today patient actually appears to be doing very well as far as her foot ulcer specifically the great toe on the left is concerned. She did have an area of rubbing on the medial aspect of her left ankle which again is from the cast. Fortunately there's no signs of infection at  this point in this appears to be a very slight skin breakdown. The patient tells me she felt it rubbing but didn't think it was that bad. Fortunately there is no signs of active infection at this time which is good news. No fevers, chills, nausea, or vomiting noted at this time. 01/15/19 on evaluation today patient actually appears to be doing well in regard to her toe ulcer. Again as previous she seems to do well and she has the cast on which indicates to me that during the time she doesn't have a cast on she's putting way too much pressure on this region. Obviously I think that's gonna be an issue as with the current national emergency concerning the Covid-19 Virus it has been recommended that we discontinue the use of total contact casting by the chief medical officer of our company, Dr. Simona Huh. The reasoning is that if a patient becomes sick and cannot come into have the cast removed they could not just leave this on for an additional two weeks. Obviously the hospitals also do not want to receive patient's who are sick into the emergency department to potentially contaminate the region and spread the Covid-19 Virus among other sick individuals within the hospital system. Therefore at this point we  are suspending the use of total contact cast until the current emergency subsides. This was all discussed with the patient today as well. 01/22/19 on evaluation today patient's wound on her left great toe appears to be doing slightly worse than previously noted last week. She tells me that she has been on this quite a bit in fact she tells me she's been awake for 38 straight hours. This is due to the fact that she's having to care for grandparents because nobody else will. She has been taking care of them for five the last seven days since I've seen her they both have dementia his is from a stroke and her grandmother's was progressive. Nonetheless she states even her mom who knows her condition and  situation has only help two of those days to take care of them she's been taking care of the rest. Fortunately there does not appear to be any signs of active infection in regard to her toe at this point although obviously it doesn't look as good as it did previous. I think this is directly related to her not taking off the pressure and friction by way of taking things easy. Though I completely understand what's going on. [name], [name] ([id number]) 01/29/19 on evaluation today patient's tools are actually appears to be showing some signs of improvement today compared to last week's evaluation as far as not necessarily the overall size of the wound but the fact that she has some new skin growth in between the two ends of the wound opening. Overall I feel like she has done well she states that she had a family member give her what sounds to be a CAM walker boot which has been helpful as well. 02/05/19 on evaluation today patient's wound bed actually appears to be doing significantly better in regard to her overall appearance of the size of the wound. With that being said she is still having an issue with offloading efficiently enough to get this to close. Apparently there is some signs of infection at this point as well unfortunately. Previously she's done well of Augmentin I really do not see anything that needs to be culture currently but there theme and cellulitis of the foot that I'm seeing I'm gonna go ahead and place her on an antibiotic today to try to help clear this up. 02/12/2019 on evaluation today patient actually appears to be doing poorly in regard to her overall wound status. She tells me she has been using her offloading shoe but actually comes in today wearing her tennis shoe with the AFO brace. Again as I previously discussed with her this is really not sufficient to allow the area to heal appropriately. Nonetheless she continues to be somewhat noncompliant and I do wonder based on  what she has told my nurse in the past as to whether or not a good portion of this noncompliance may be recreational drug and alcohol related. She has had a history of heroin overdose and this was fairly recently in the past couple of months that have been seeing her. Nonetheless overall I feel like her wound looks significantly worse today compared to what it was previous. She still has significant erythema despite the Augmentin I am not sure that this is an appropriate medication for her infection I am also concerned that the infection may have gone down into her bone. 02/19/19 on evaluation today patient actually appears to be doing about the same in regard to her toe ulcer. Unfortunately she continues  to show signs of bone exposure and infection at this point. There does not appear to be any evidence of worsening of the infection but I'm also not really sure that it's getting significantly better. She is on the Augmentin which should be sufficient for the Staphylococcus aureus infection that she has at this point. With that being said she may need IV antibiotics to more appropriately treat this. We did have a discussion today about hyperbaric option therapy. 02/28/19 on evaluation today patient actually appears to be doing much worse in regard to the wound on her left great toe as compared to even my previous evaluation last week. Unfortunately this seems to be training in a pretty poor direction. Her toe was actually now starting to angle laterally and I can actually see the entire joint area of the proximal portion of the digit where is the distal portion of the digit again is no longer even in contact with the joint line. Unfortunately there's a lot more necrotic tissue around the edge and the toe appears to be showing signs of becoming gangrenous in my pinion. I'm very concerned about were things stand at this point. She did see infectious disease and they are planning to send in a prescription  for Sivextro for her and apparently this has been approved. With that being said I don't think she should avoid taking this but at the same time I'm not sure that it's gonna be sufficient to save her toe at this point. 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 Patient History Information obtained from Patient. Social History Current every day smoker. Medical History Ear/Nose/Mouth/Throat Patient has history of Chronic sinus problems/congestion, Middle ear problems Endocrine Patient has history of Type II Diabetes Review of Systems (ROS) Constitutional Symptoms (General Health) Denies complaints or symptoms of Fatigue, Fever, Chills, Marked Weight Change. Respiratory Denies complaints or symptoms of Chronic or frequent coughs, Shortness of Breath. Cardiovascular Denies complaints or symptoms of Chest pain, LE edema. Psychiatric Denies complaints or symptoms of Anxiety, Claustrophobia. Etheredge, Dorsey A. ([id number]) Objective Constitutional Well-nourished and well-hydrated in no acute distress. Vitals Time Taken: 2:52 PM, Height: 69 in, Weight: 240 lbs, BMI: 35.4, Temperature: 98.5 F, Pulse: 90 bpm, Respiratory Rate: 20 breaths/min, Blood Pressure: 111/80 mmHg. Respiratory normal breathing without difficulty. clear to auscultation bilaterally. Cardiovascular regular rate and rhythm with normal S1, S2. Psychiatric this patient is able to make decisions and demonstrates good insight into disease process. Alert and Oriented x 3. pleasant and cooperative. General Notes: Upon inspection today patient's wound bed actually shows evidence of upon material around the edge of the wound. Unfortunately she also has exposure of the joint space itself and again the tone was starting to angle  towards the lateral aspect completely exposing the joint line and it is completely disconnected on the medial aspect unfortunately. Overall coupled with the osteomyelitis that we've already noted I'm very concerned that she also seems to be developing some degree of gangrene which likely is due to the fact that she's losing a significant portion of the connective tissue and possibly the blood supply to the distal portion of this toe. I think that she may need to strongly consider amputation based on what I'm seeing at this point definitely I think smoking cessation offloading  are important but again I think she may be too far past the point of return at this time. Integumentary (Hair, Skin) Wound #5 status is Open. Original cause of wound was Gradually Appeared. The wound is located on the Left Toe Great. The wound measures 3cm length x 2cm width x 0.6cm depth; 4.712cm^2 area and 2.827cm^3 volume. There is bone, muscle, and Fat Layer (Subcutaneous Tissue) Exposed exposed. Tunneling has been noted at 6:00 with a maximum distance of 0.5cm. Undermining begins at 10:00 and ends at 5:00 with a maximum distance of 0.6cm. There is a medium amount of serous drainage noted. The wound margin is epibole. There is small (1-33%) pink, hyper - granulation within the wound bed. There is a large (67-100%) amount of necrotic tissue within the wound bed including Adherent Slough and Necrosis of Muscle. The periwound skin appearance exhibited: Callus, Maceration, Rubor, Erythema. The periwound skin appearance did not exhibit: Crepitus, Excoriation, Induration, Rash, Scarring, Dry/Scaly, Atrophie Blanche, Cyanosis, Ecchymosis, Hemosiderin Staining, Mottled, Pallor. The surrounding wound skin color is noted with erythema which is circumferential. Periwound temperature was noted as Hot. Assessment Active Problems ICD-10 [name], [name] ([id number]) Type 2 diabetes mellitus with foot ulcer Non-pressure chronic  ulcer of other part of left foot with fat layer exposed Foot drop, left foot Other chronic osteomyelitis, left ankle and foot Plan Wound Cleansing: Wound #5 Left Toe Great: Clean wound with Normal Saline. Anesthetic (add to Medication List): Wound #5 Left Toe Great: Topical Lidocaine 4% cream applied to wound bed prior to debridement (In Clinic Only). Primary Wound Dressing: Wound #5 Left Toe Great: Silver Alginate Secondary Dressing: Wound #5 Left Toe Great: Conform/Kerlix Foam Dressing Change Frequency: Wound #5 Left Toe Great: Three times weekly Follow-up Appointments: Wound #5 Left Toe Great: Return Appointment in 1 week. Off-Loading: Wound #5 Left Toe Great: Open toe surgical shoe to: - Wear your surgical shoe with your brace. No tennis Shoes! Additional Orders / Instructions: Wound #5 Left Toe Great: Increase protein intake. Medications-please add to medication list.: Wound #5 Left Toe Great: P.O. Antibiotics My suggestion currently is gonna be that we go ahead and see about making a referral to vascular for them to go ahead and evaluate her for potential amputation. I think that she may be better served going ahead and amputating the toe versus allowing this to spread further up her foot which I think is a great concern at least in my pinion at this point. The patient is in agreement with proceeding in that regard I will go ahead and send this note over to vascular to see if we can get her in for an appointment ASAP. If they have any concerns or questions definitely they may give me a call for further explanation in detail but again I think the main issue here is if she doesn't proceed with the amputation shortly I think that this is gonna likely spread and become a more significant issue. Please see above for specific wound care orders. We will see patient for re-evaluation in 1 week(s) here in the clinic. If anything worsens or changes patient will contact our office  for additional recommendations. I did advise that she go ahead and contact the infectious disease specialist to see if they can have our report in the [place] for her. [name], [name] ([id number]) Electronic Signature(s) Signed: 02/28/2019 3:22:54 PM By: Worthy Keeler PA-C Entered By: Worthy Keeler on 02/28/2019 15:21:13 [name] ([id number]) -------------------------------------------------------------------------------- ROS/PFSH Details Patient Name: Heather Dear  A. Date of Service: 02/28/2019 2:45 PM Medical Record Number: [id number] Patient Account Number: [id number] Date of Birth/Sex: 02-25-1976 (43 y.o. F) Treating RN: Cornell Barman Primary Care Provider: Priscille Kluver Other Clinician: Referring Provider: Priscille Kluver Treating Provider/Extender: Melburn Hake, HOYT Weeks in Treatment: 29 Information Obtained From Patient Constitutional Symptoms (General Health) Complaints and Symptoms: Negative for: Fatigue; Fever; Chills; Marked Weight Change Respiratory Complaints and Symptoms: Negative for: Chronic or frequent coughs; Shortness of Breath Cardiovascular Complaints and Symptoms: Negative for: Chest pain; LE edema Psychiatric Complaints and Symptoms: Negative for: Anxiety; Claustrophobia Ear/Nose/Mouth/Throat Medical History: Positive for: Chronic sinus problems/congestion; Middle ear problems Endocrine Medical History: Positive for: Type II Diabetes Time with diabetes: 10 years Treated with: Insulin, Oral agents Blood sugar tested every day: No Blood sugar testing results: Lunch: 350 HBO Extended History Items Ear/Nose/Mouth/Throat: Ear/Nose/Mouth/Throat: Chronic sinus problems/congestion Middle ear problems Immunizations Pneumococcal Vaccine: Received Pneumococcal Vaccination: No Implantable Devices No devices added Boike, Armentha A. ([id number]) Family and Social History Current every day smoker Physician Affirmation I have reviewed and agree with the  above information. Electronic Signature(s) Signed: 02/28/2019 3:22:54 PM By: Worthy Keeler PA-C Signed: 02/28/2019 5:18:06 PM By: Gretta Cool, BSN, RN, CWS, [name] RN, BSN Entered By: Worthy Keeler on 02/28/2019 15:19:05 [name] ([id number]) -------------------------------------------------------------------------------- SuperBill Details Patient Name: Heather Dear A. Date of Service: 02/28/2019 Medical Record Number: [id number] Patient Account Number: [id number] Date of Birth/Sex: 12/13/1975 (43 y.o. F) Treating RN: Cornell Barman Primary Care Provider: Priscille Kluver Other Clinician: Referring Provider: Priscille Kluver Treating Provider/Extender: Melburn Hake, HOYT Weeks in Treatment: 29 Diagnosis Coding ICD-10 Codes Code Description E11.621 Type 2 diabetes mellitus with foot ulcer L97.522 Non-pressure chronic ulcer of other part of left foot with fat layer exposed M21.372 Foot drop, left foot M86.672 Other chronic osteomyelitis, left ankle and foot Facility Procedures CPT4 Code: 80998338 Description: 99213 - WOUND CARE VISIT-LEV 3 EST PT Modifier: Quantity: 1 Physician Procedures CPT4 Code: [id number] Description: 67341 - WC PHYS LEVEL 4 - EST PT ICD-10 Diagnosis Description E11.621 Type 2 diabetes mellitus with foot ulcer L97.522 Non-pressure chronic ulcer of other part of left foot with fa M21.372 Foot drop, left foot M86.672 Other chronic  osteomyelitis, left ankle and foot Modifier: t layer exposed Quantity: 1 Electronic Signature(s) Signed: 02/28/2019 3:22:54 PM By: Worthy Keeler PA-C Entered By: Worthy Keeler on 02/28/2019 15:21:24

## 2019-02-28 NOTE — Progress Notes (Signed)
[name], [name] ([id number]) Visit Report for 02/28/2019 Arrival Information Details Patient Name: [name], [name] Date of Service: 02/28/2019 2:45 PM Medical Record Number: [id number] Patient Account Number: [id number] Date of Birth/Sex: 1976-07-16 (43 y.o. F) Treating RN: Harold Barban Primary Care Lacreshia Bondarenko: Priscille Kluver Other Clinician: Referring Shajuana Mclucas: Priscille Kluver Treating Shaton Lore/Extender: Melburn Hake, HOYT Weeks in Treatment: 73 Visit Information History Since Last Visit Added or deleted any medications: No Patient Arrived: Wheel Chair Any new allergies or adverse reactions: No Arrival Time: 14:49 Had a fall or experienced change in No activities of daily living that may affect Accompanied By: self risk of falls: Transfer Assistance: None Signs or symptoms of abuse/neglect since last visito No Patient Identification Verified: Yes Hospitalized since last visit: No Secondary Verification Process Completed: Yes Has Dressing in Place as Prescribed: Yes Patient Requires Transmission-Based No Has Footwear/Offloading in Place as Prescribed: Yes Precautions: Left: Wedge Shoe Patient Has Alerts: No Pain Present Now: No Electronic Signature(s) Signed: 02/28/2019 3:32:51 PM By: Harold Barban Entered By: Harold Barban on 02/28/2019 14:49:53 Wissink, Keiasha AMarland Kitchen ([id number]) -------------------------------------------------------------------------------- Clinic Level of Care Assessment Details Patient Name: Heather Dear A. Date of Service: 02/28/2019 2:45 PM Medical Record Number: [id number] Patient Account Number: [id number] Date of Birth/Sex: [birth year] (43 y.o. F) Treating RN: Cornell Barman Primary Care Moxie Kalil: Priscille Kluver Other Clinician: Referring Eliceo Gladu: Priscille Kluver Treating Lavren Lewan/Extender: Melburn Hake, HOYT Weeks in Treatment: 29 Clinic Level of Care Assessment Items TOOL 4 Quantity Score []  - Use when only an EandM is performed on FOLLOW-UP visit 0 ASSESSMENTS -  Nursing Assessment / Reassessment []  - Reassessment of Co-morbidities (includes updates in patient status) 0 X- 1 5 Reassessment of Adherence to Treatment Plan ASSESSMENTS - Wound and Skin Assessment / Reassessment X - Simple Wound Assessment / Reassessment - one wound 1 5 []  - 0 Complex Wound Assessment / Reassessment - multiple wounds []  - 0 Dermatologic / Skin Assessment (not related to wound area) ASSESSMENTS - Focused Assessment []  - Circumferential Edema Measurements - multi extremities 0 []  - 0 Nutritional Assessment / Counseling / Intervention []  - 0 Lower Extremity Assessment (monofilament, tuning fork, pulses) []  - 0 Peripheral Arterial Disease Assessment (using hand held doppler) ASSESSMENTS - Ostomy and/or Continence Assessment and Care []  - Incontinence Assessment and Management 0 []  - 0 Ostomy Care Assessment and Management (repouching, etc.) PROCESS - Coordination of Care X - Simple Patient / Family Education for ongoing care 1 15 []  - 0 Complex (extensive) Patient / Family Education for ongoing care X- 1 10 Staff obtains Programmer, systems, Records, Test Results / Process Orders []  - 0 Staff telephones HHA, Nursing Homes / Clarify orders / etc []  - 0 Routine Transfer to another Facility (non-emergent condition) []  - 0 Routine Hospital Admission (non-emergent condition) []  - 0 New Admissions / Biomedical engineer / Ordering NPWT, Apligraf, etc. []  - 0 Emergency Hospital Admission (emergent condition) X- 1 10 Simple Discharge Coordination [name], [name] ([id number]) []  - 0 Complex (extensive) Discharge Coordination PROCESS - Special Needs []  - Pediatric / Minor Patient Management 0 []  - 0 Isolation Patient Management []  - 0 Hearing / Language / Visual special needs []  - 0 Assessment of Community assistance (transportation, D/C planning, etc.) []  - 0 Additional assistance / Altered mentation []  - 0 Support Surface(s) Assessment (bed, cushion, seat,  etc.) INTERVENTIONS - Wound Cleansing / Measurement X - Simple Wound Cleansing - one wound 1 5 []  - 0 Complex Wound Cleansing - multiple wounds X- 1 5 Wound Imaging (  photographs - any number of wounds) []  - 0 Wound Tracing (instead of photographs) X- 1 5 Simple Wound Measurement - one wound []  - 0 Complex Wound Measurement - multiple wounds INTERVENTIONS - Wound Dressings []  - Small Wound Dressing one or multiple wounds 0 X- 1 15 Medium Wound Dressing one or multiple wounds []  - 0 Large Wound Dressing one or multiple wounds []  - 0 Application of Medications - topical []  - 0 Application of Medications - injection INTERVENTIONS - Miscellaneous []  - External ear exam 0 []  - 0 Specimen Collection (cultures, biopsies, blood, body fluids, etc.) []  - 0 Specimen(s) / Culture(s) sent or taken to Lab for analysis []  - 0 Patient Transfer (multiple staff / Civil Service fast streamer / Similar devices) []  - 0 Simple Staple / Suture removal (25 or less) []  - 0 Complex Staple / Suture removal (26 or more) []  - 0 Hypo / Hyperglycemic Management (close monitor of Blood Glucose) []  - 0 Ankle / Brachial Index (ABI) - do not check if billed separately X- 1 5 Vital Signs [name], [name] ([id number]) Has the patient been seen at the hospital within the last three years: Yes Total Score: 80 Level Of Care: New/Established - Level 3 Electronic Signature(s) Signed: 02/28/2019 5:18:06 PM By: Gretta Cool, BSN, RN, CWS, [name] RN, BSN Entered By: Gretta Cool, BSN, RN, CWS, [name] on 02/28/2019 15:11:33 [name] ([id number]) -------------------------------------------------------------------------------- Encounter Discharge Information Details Patient Name: Heather Dear A. Date of Service: 02/28/2019 2:45 PM Medical Record Number: [id number] Patient Account Number: [id number] Date of Birth/Sex: 06-19-1976 (43 y.o. F) Treating RN: Cornell Barman Primary Care Shalah Estelle: Priscille Kluver Other Clinician: Referring Trinnity Breunig:  Priscille Kluver Treating Laneisha Mino/Extender: Melburn Hake, HOYT Weeks in Treatment: 62 Encounter Discharge Information Items Discharge Condition: Stable Ambulatory Status: Ambulatory Discharge Destination: Home Transportation: Private Auto Accompanied By: self Schedule Follow-up Appointment: Yes Clinical Summary of Care: Electronic Signature(s) Signed: 02/28/2019 5:18:06 PM By: Gretta Cool, BSN, RN, CWS, [name] RN, BSN Entered By: Gretta Cool, BSN, RN, CWS, [name] on 02/28/2019 15:20:24 [name] ([id number]) -------------------------------------------------------------------------------- Lower Extremity Assessment Details Patient Name: Heather Dear A. Date of Service: 02/28/2019 2:45 PM Medical Record Number: [id number] Patient Account Number: [id number] Date of Birth/Sex: [birth year] (43 y.o. F) Treating RN: Harold Barban Primary Care Steward Sames: Priscille Kluver Other Clinician: Referring Ethie Curless: Priscille Kluver Treating Reighn Kaplan/Extender: STONE III, HOYT Weeks in Treatment: 29 Vascular Assessment Pulses: Dorsalis Pedis Palpable: [Left:Yes] Posterior Tibial Palpable: [Left:Yes] Electronic Signature(s) Signed: 02/28/2019 3:32:51 PM By: Harold Barban Entered By: Harold Barban on 02/28/2019 15:01:11 [name], [name] ([id number]) -------------------------------------------------------------------------------- Multi Wound Chart Details Patient Name: Heather Dear A. Date of Service: 02/28/2019 2:45 PM Medical Record Number: [id number] Patient Account Number: [id number] Date of Birth/Sex: 1976/03/12 (43 y.o. F) Treating RN: Cornell Barman Primary Care Lujuana Kapler: Priscille Kluver Other Clinician: Referring Trinitee Horgan: Priscille Kluver Treating Jearline Hirschhorn/Extender: Melburn Hake, HOYT Weeks in Treatment: 29 Vital Signs Height(in): 69 Pulse(bpm): 90 Weight(lbs): 240 Blood Pressure(mmHg): 111/80 Body Mass Index(BMI): 35 Temperature(F): 98.5 Respiratory Rate 20 (breaths/min): Photos: [N/A:N/A] Wound Location: Left  Toe Great N/A N/A Wounding Event: Gradually Appeared N/A N/A Primary Etiology: Diabetic Wound/Ulcer of the N/A N/A Lower Extremity Comorbid History: Chronic sinus N/A N/A problems/congestion, Middle ear problems, Type II Diabetes Date Acquired: 11/16/2018 N/A N/A Weeks of Treatment: 13 N/A N/A Wound Status: Open N/A N/A Measurements L x W x D 3x2x0.6 N/A N/A (cm) Area (cm) : 4.712 N/A N/A Volume (cm) : 2.827 N/A N/A % Reduction in Area: -1099.00% N/A N/A % Reduction in Volume: -1700.60%  N/A N/A Position 1 (o'clock): 6 Maximum Distance 1 (cm): 0.5 Starting Position 1 10 (o'clock): Ending Position 1 5 (o'clock): Maximum Distance 1 (cm): 0.6 Tunneling: Yes N/A N/A Undermining: Yes N/A N/A Classification: Grade 3 N/A N/A Exudate Amount: Medium N/A N/A Exudate Type: Serous N/A N/A Exudate Color: amber N/A N/A [name], [name] ([id number]) Wound Margin: Epibole N/A N/A Granulation Amount: Small (1-33%) N/A N/A Granulation Quality: Pink, Hyper-granulation N/A N/A Necrotic Amount: Large (67-100%) N/A N/A Exposed Structures: Fat Layer (Subcutaneous N/A N/A Tissue) Exposed: Yes Muscle: Yes Bone: Yes Fascia: No Tendon: No Joint: No Epithelialization: None N/A N/A Periwound Skin Texture: Callus: Yes N/A N/A Excoriation: No Induration: No Crepitus: No Rash: No Scarring: No Periwound Skin Moisture: Maceration: Yes N/A N/A Dry/Scaly: No Periwound Skin Color: Erythema: Yes N/A N/A Rubor: Yes Atrophie Blanche: No Cyanosis: No Ecchymosis: No Hemosiderin Staining: No Mottled: No Pallor: No Erythema Location: Circumferential N/A N/A Temperature: Hot N/A N/A Tenderness on Palpation: No N/A N/A Treatment Notes Electronic Signature(s) Signed: 02/28/2019 5:18:06 PM By: Gretta Cool, BSN, RN, CWS, [name] RN, BSN Entered By: Gretta Cool, BSN, RN, CWS, [name] on 02/28/2019 15:08:02 [name]  ([id number]) -------------------------------------------------------------------------------- Multi-Disciplinary Care Plan Details Patient Name: Heather Dear A. Date of Service: 02/28/2019 2:45 PM Medical Record Number: [id number] Patient Account Number: [id number] Date of Birth/Sex: 12-26-1975 (43 y.o. F) Treating RN: Cornell Barman Primary Care Ema Hebner: Priscille Kluver Other Clinician: Referring Sharonann Malbrough: Priscille Kluver Treating Bryley Kovacevic/Extender: Melburn Hake, HOYT Weeks in Treatment: 21 Active Inactive Abuse / Safety / Falls / Self Care Management Nursing Diagnoses: History of Falls Goals: Patient will not experience any injury related to falls Date Initiated: 08/07/2018 Target Resolution Date: 10/19/2018 Goal Status: Active Interventions: Assess fall risk on admission and as needed Notes: Necrotic Tissue Nursing Diagnoses: Impaired tissue integrity related to necrotic/devitalized tissue Goals: Necrotic/devitalized tissue will be minimized in the wound bed Date Initiated: 02/28/2019 Target Resolution Date: 03/28/2019 Goal Status: Active Interventions: Assess patient pain level pre-, during and post procedure and prior to discharge Treatment Activities: Apply topical anesthetic as ordered : 02/28/2019 Notes: Orientation to the Wound Care Program Nursing Diagnoses: Knowledge deficit related to the wound healing center program Goals: Patient/caregiver will verbalize understanding of the [place] Program Date Initiated: 08/07/2018 Target Resolution Date: 10/19/2018 Goal Status: Active SALENE, MOHAMUD A. ([id number]) Interventions: Provide education on orientation to the wound center Notes: Soft Tissue Infection Nursing Diagnoses: Impaired tissue integrity Goals: Patient will remain free of wound infection Date Initiated: 08/07/2018 Target Resolution Date: 10/19/2018 Goal Status: Active Interventions: Assess signs and symptoms of infection every  visit Notes: Wound/Skin Impairment Nursing Diagnoses: Impaired tissue integrity Goals: Ulcer/skin breakdown will heal within 14 weeks Date Initiated: 08/07/2018 Target Resolution Date: 10/19/2018 Goal Status: Active Interventions: Assess patient/caregiver ability to obtain necessary supplies Notes: Electronic Signature(s) Signed: 02/28/2019 5:18:06 PM By: Gretta Cool, BSN, RN, CWS, [name] RN, BSN Entered By: Gretta Cool, BSN, RN, CWS, [name] on 02/28/2019 15:07:53 [name] ([id number]) -------------------------------------------------------------------------------- Pain Assessment Details Patient Name: Heather Dear A. Date of Service: 02/28/2019 2:45 PM Medical Record Number: [id number] Patient Account Number: [id number] Date of Birth/Sex: [birth year] (43 y.o. F) Treating RN: Harold Barban Primary Care Regis Hinton: Priscille Kluver Other Clinician: Referring Luca Dyar: Priscille Kluver Treating Paige Vanderwoude/Extender: STONE III, HOYT Weeks in Treatment: 29 Active Problems Location of Pain Severity and Description of Pain Patient Has Paino No Site Locations Pain Management and Medication Current Pain Management: Electronic Signature(s) Signed: 02/28/2019 3:32:51 PM By: Harold Barban Entered By: Harold Barban on 02/28/2019 14:50:00  [name], [name] ([id number]) -------------------------------------------------------------------------------- Patient/Caregiver Education Details Patient Name: Heather Dear A. Date of Service: 02/28/2019 2:45 PM Medical Record Number: [id number] Patient Account Number: [id number] Date of Birth/Gender: 07/08/1976 (43 y.o. F) Treating RN: Cornell Barman Primary Care Physician: Priscille Kluver Other Clinician: Referring Physician: Priscille Kluver Treating Physician/Extender: Sharalyn Ink in Treatment: 41 Education Assessment Education Provided To: Patient Education Topics Provided Wound/Skin Impairment: Handouts: Caring for Your Ulcer Methods: Demonstration,  Explain/Verbal Responses: State content correctly Electronic Signature(s) Signed: 02/28/2019 5:18:06 PM By: Gretta Cool, BSN, RN, CWS, [name] RN, BSN Entered By: Gretta Cool, BSN, RN, CWS, [name] on 02/28/2019 15:15:24 [name] ([id number]) -------------------------------------------------------------------------------- Wound Assessment Details Patient Name: Heather Dear A. Date of Service: 02/28/2019 2:45 PM Medical Record Number: [id number] Patient Account Number: [id number] Date of Birth/Sex: 28-Sep-1976 (43 y.o. F) Treating RN: Harold Barban Primary Care Tin Engram: Priscille Kluver Other Clinician: Referring Sunshine Mackowski: Priscille Kluver Treating Cathaleen Korol/Extender: STONE III, HOYT Weeks in Treatment: 29 Wound Status Wound Number: 5 Primary Diabetic Wound/Ulcer of the Lower Extremity Etiology: Wound Location: Left Toe Great Wound Open Wounding Event: Gradually Appeared Status: Date Acquired: 11/16/2018 Comorbid Chronic sinus problems/congestion, Middle ear Weeks Of Treatment: 13 History: problems, Type II Diabetes Clustered Wound: No Photos Wound Measurements Length: (cm) 3 % Reduction Width: (cm) 2 % Reduction Depth: (cm) 0.6 Epithelializ Area: (cm) 4.712 Tunneling: Volume: (cm) 2.827 Position Maximum D in Area: -1099% in Volume: -1700.6% ation: None Yes (o'clock): 6 istance: (cm) 0.5 Undermining: Yes Starting Position (o'clock): 10 Ending Position (o'clock): 5 Maximum Distance: (cm) 0.6 Wound Description Classification: Grade 3 Foul Odor Af Wound Margin: Epibole Slough/Fibri Exudate Amount: Medium Exudate Type: Serous Exudate Color: amber ter Cleansing: No no Yes Wound Bed Granulation Amount: Small (1-33%) Exposed Structure Granulation Quality: Pink, Hyper-granulation Fascia Exposed: No Necrotic Amount: Large (67-100%) Fat Layer (Subcutaneous Tissue) Exposed: Yes Sensing, Marcayla A. ([id number]) Necrotic Quality: Adherent Slough Tendon Exposed: No Muscle Exposed:  Yes Necrosis of Muscle: Yes Joint Exposed: No Bone Exposed: Yes Periwound Skin Texture Texture Color No Abnormalities Noted: No No Abnormalities Noted: No Callus: Yes Atrophie Blanche: No Crepitus: No Cyanosis: No Excoriation: No Ecchymosis: No Induration: No Erythema: Yes Rash: No Erythema Location: Circumferential Scarring: No Hemosiderin Staining: No Mottled: No Moisture Pallor: No No Abnormalities Noted: No Rubor: Yes Dry / Scaly: No Maceration: Yes Temperature / Pain Temperature: Hot Treatment Notes Wound #5 (Left Toe Great) Notes silver alginate, foam, conform, medipore tape. Electronic Signature(s) Signed: 02/28/2019 3:32:51 PM By: Harold Barban Signed: 02/28/2019 5:18:06 PM By: Gretta Cool, BSN, RN, CWS, [name] RN, BSN Entered By: Gretta Cool, BSN, RN, CWS, [name] on 02/28/2019 15:10:03 [name] ([id number]) -------------------------------------------------------------------------------- Vitals Details Patient Name: Heather Dear A. Date of Service: 02/28/2019 2:45 PM Medical Record Number: [id number] Patient Account Number: [id number] Date of Birth/Sex: 05-04-1976 (43 y.o. F) Treating RN: Harold Barban Primary Care Bayron Dalto: Priscille Kluver Other Clinician: Referring Richerd Grime: Priscille Kluver Treating Tayloranne Lekas/Extender: STONE III, HOYT Weeks in Treatment: 29 Vital Signs Time Taken: 14:52 Temperature (F): 98.5 Height (in): 69 Pulse (bpm): 90 Weight (lbs): 240 Respiratory Rate (breaths/min): 20 Body Mass Index (BMI): 35.4 Blood Pressure (mmHg): 111/80 Reference Range: 80 - 120 mg / dl Electronic Signature(s) Signed: 02/28/2019 3:32:51 PM By: Harold Barban Entered By: Harold Barban on 02/28/2019 14:55:55

## 2019-02-28 NOTE — Telephone Encounter (Addendum)
[facility] Patient Advocate Encounter  Prior Authorization for Sivextro has been approved.  The claim was captured at the pharmacy by [facility] Medicaid due to being high dollar.  It will be reviewed by [facility] Medicaid and able to process within 3 business days (they state) which is Monday 03/04/2019 or Tuesday 03/05/2019.  I will check to see if it is able to be processed daily to see if there is an early decision.  [id number] [place]   Effective Begin Date:02/27/2019 Effective End Date:04/28/2019  Copay is $3  [facility] will continue to follow and process Monday 03/04/2019 to see if able to get a paid claim.  If not, will try Tuesday 03/05/2019.  Venida Jarvis. Nadara Mustard [place] [facility] Phone: [phone] Fax:  [phone]

## 2019-02-28 NOTE — Telephone Encounter (Signed)
Sounds good. Thank you

## 2019-02-28 NOTE — Telephone Encounter (Signed)
Medication approved through [facility] and has been processed at [facility]. Medication will be ordered and ready for pick up or mail on 03/01/2019. The first script will be filled as a 30 tablets and the following script will be processed as 12 unit dosed tablets. It will be split into two fills due to high cost of the medication and billing for insurance. Total treatment course is 6 weeks. I left a voicemail on the patients number with an update and to call us back for [employer].

## 2019-02-28 NOTE — Telephone Encounter (Signed)
Awesome, glad that was approved. Thank you Inez Catalina!

## 2019-02-28 NOTE — Addendum Note (Signed)
Addended by: Darletta Moll on: 02/28/2019 10:11 AM   Modules accepted: Orders

## 2019-03-01 NOTE — Telephone Encounter (Signed)
UPDATE:  Medicaid unlocked the hold on [name]'s Sivextro, Medication was ordered and she states she is going to pick it up today from [facility].  Venida Jarvis. Nadara Mustard [place] Patient [facility] for [facility] Phone: [phone] Fax:  [phone]

## 2019-03-04 ENCOUNTER — Encounter (INDEPENDENT_AMBULATORY_CARE_PROVIDER_SITE_OTHER): Payer: Self-pay | Admitting: Vascular Surgery

## 2019-03-04 ENCOUNTER — Ambulatory Visit (INDEPENDENT_AMBULATORY_CARE_PROVIDER_SITE_OTHER): Payer: Medicaid Other | Admitting: Vascular Surgery

## 2019-03-04 ENCOUNTER — Other Ambulatory Visit: Payer: Self-pay

## 2019-03-04 VITALS — BP 100/58 | HR 114 | Resp 12 | Ht 69.0 in | Wt 210.0 lb

## 2019-03-04 DIAGNOSIS — I70201 Unspecified atherosclerosis of native arteries of extremities, right leg: Secondary | ICD-10-CM | POA: Diagnosis not present

## 2019-03-04 DIAGNOSIS — L97529 Non-pressure chronic ulcer of other part of left foot with unspecified severity: Secondary | ICD-10-CM | POA: Diagnosis not present

## 2019-03-04 DIAGNOSIS — E1100 Type 2 diabetes mellitus with hyperosmolarity without nonketotic hyperglycemic-hyperosmolar coma (NKHHC): Secondary | ICD-10-CM | POA: Diagnosis not present

## 2019-03-04 DIAGNOSIS — F17200 Nicotine dependence, unspecified, uncomplicated: Secondary | ICD-10-CM

## 2019-03-04 DIAGNOSIS — I70245 Atherosclerosis of native arteries of left leg with ulceration of other part of foot: Secondary | ICD-10-CM | POA: Diagnosis not present

## 2019-03-04 DIAGNOSIS — I7025 Atherosclerosis of native arteries of other extremities with ulceration: Secondary | ICD-10-CM | POA: Insufficient documentation

## 2019-03-04 NOTE — Progress Notes (Signed)
MRN : [id number]  [name] is a 43 y.o. ([birth year]) female who presents with chief complaint of  Chief Complaint  Patient presents with  . New Patient (Initial Visit)  .  History of Present Illness:  The patient is seen for evaluation of painful lower extremities and diminished pulses associated with ulceration of the left great toe.  The patient notes the ulcer has been present for many months and has not been improving.  She is concerned because   It is very painful but she denies drainage.  No specific history of trauma noted by the patient.  The patient denies fever or chills.  the patient does have diabetes which has been difficult to control.  Patient notes prior to the ulcer developing the extremities were painful particularly with ambulation or activity and the discomfort is very consistent day today. Typically, the pain occurs at less than one block, progress is as activity continues to the point that the patient must stop walking. Resting including standing still for several minutes allowed resumption of the activity and the ability to walk a similar distance before stopping again. Uneven terrain and inclined shorten the distance. The pain has been progressive over the past several years.   The patient denies rest pain or dangling of an extremity off the side of the bed during the night for relief. No prior interventions or surgeries.  No history of back problems or DJD of the lumbar sacral spine.   The patient denies amaurosis fugax or recent TIA symptoms. There are no recent neurological changes noted. The patient denies history of DVT, PE or superficial thrombophlebitis. The patient denies recent episodes of angina or shortness of breath.    Current Meds  Medication Sig  . ACCU-CHEK AVIVA PLUS test strip USE TO CHECK BLOOD SUGAR QD  . amitriptyline (ELAVIL) 50 MG tablet Take 1 tablet (50 mg total) by mouth at bedtime.  Marland Kitchen atorvastatin (LIPITOR) 20 MG tablet Take 20  mg by mouth daily.  Marland Kitchen gabapentin (NEURONTIN) 600 MG tablet Take 1 tablet (600 mg total) by mouth 3 (three) times daily.  . hydrOXYzine (ATARAX/VISTARIL) 50 MG tablet TAKE 1 TABLET PO BID PRN FOR ANXIETY  . insulin detemir (LEVEMIR) 100 UNIT/ML injection Inject 0.24 mLs (24 Units total) into the skin at bedtime.  . insulin lispro (HUMALOG) 100 UNIT/ML injection Inject 2-12 Units into the skin 3 (three) times daily with meals.  . Insulin Syringes, Disposable, U-100 0.3 ML MISC Inject 24 Units into the skin at bedtime.  . meloxicam (MOBIC) 7.5 MG tablet   . metFORMIN (GLUCOPHAGE) 1000 MG tablet Take 1,000 mg by mouth 2 (two) times daily with a meal.  . metoprolol succinate (TOPROL-XL) 50 MG 24 hr tablet Take 50 mg by mouth daily. Take with or immediately following a meal.  . prazosin (MINIPRESS) 1 MG capsule Take 1 to 2 capsules at bedtime as needed for night mares  . promethazine (PHENERGAN) 12.5 MG tablet   . QUEtiapine (SEROQUEL) 100 MG tablet Take 1 tablet (100 mg total) by mouth at bedtime.  . ranitidine (ZANTAC) 300 MG tablet Take by mouth.  Marland Kitchen SIVEXTRO 200 MG TABS Take 1 tablet by mouth daily.  . tamsulosin (FLOMAX) 0.4 MG CAPS capsule TK 1 C PO BID 30 MIN AFTER A MEAL  . Venlafaxine HCl 225 MG TB24 Take by mouth.    Past Medical History:  Diagnosis Date  . Chronic combined systolic and diastolic CHF (congestive heart failure) ([place])  a. ECHO 04/23/14 LV EF: 45% - 50%,  mild LVH, diffuse hypokinesis. LV diastolic dysfunction, high ventricular filling pressure. Mild LA dilation.  . Cocaine abuse ([place])   . Diabetes mellitus without complication ([place])   . Hypertension   . Seizures ([place])     Past Surgical History:  Procedure Laterality Date  . CHOLECYSTECTOMY  2005  . TUBAL LIGATION      Social History Social History   Tobacco Use  . Smoking status: Current Every Day Smoker    Packs/day: 1.00    Years: 28.00    Pack years: 28.00  . Smokeless tobacco: Never Used  Substance Use  Topics  . Alcohol use: Not Currently    Alcohol/week: 0.0 standard drinks    Comment: Quit drinking 8 months ago (09/01/15)  . Drug use: Yes    Types: Cocaine    Family History Family History  Problem Relation Age of Onset  . Hypertension Other   . Drug abuse Mother   . Seizures Neg Hx   No family history of bleeding/clotting disorders, porphyria or autoimmune disease   Allergies  Allergen Reactions  . Albumin (Human)   . Midecamycin   . Erythromycin Rash     REVIEW OF SYSTEMS (Negative unless checked)  Constitutional: [] Weight loss  [] Fever  [] Chills Cardiac: [] Chest pain   [] Chest pressure   [] Palpitations   [] Shortness of breath when laying flat   [] Shortness of breath with exertion. Vascular:  [x] Pain in legs with walking   [] Pain in legs at rest  [] History of DVT   [] Phlebitis   [] Swelling in legs   [] Varicose veins   [x] Non-healing ulcers Pulmonary:   [] Uses home oxygen   [] Productive cough   [] Hemoptysis   [] Wheeze  [] COPD   [] Asthma Neurologic:  [] Dizziness   [] Seizures   [] History of stroke   [] History of TIA  [] Aphasia   [] Vissual changes   [] Weakness or numbness in arm   [] Weakness or numbness in leg Musculoskeletal:   [] Joint swelling   [] Joint pain   [] Low back pain Hematologic:  [] Easy bruising  [] Easy bleeding   [] Hypercoagulable state   [] Anemic Gastrointestinal:  [] Diarrhea   [] Vomiting  [] Gastroesophageal reflux/heartburn   [] Difficulty swallowing. Genitourinary:  [] Chronic kidney disease   [] Difficult urination  [] Frequent urination   [] Blood in urine Skin:  [] Rashes   [] Ulcers  Psychological:  [] History of anxiety   []  History of major depression.  Physical Examination  Vitals:   03/04/19 1453  BP: (!) 100/58  Pulse: (!) 114  Resp: 12  Weight: 210 lb (95.3 kg)  Height: 5\' 9"  (1.753 m)   Body mass index is 31.01 kg/m. Gen: WD/WN, NAD Head: [place]/AT, No temporalis wasting.  Ear/Nose/Throat: Hearing grossly intact, nares w/o erythema or drainage, poor  dentition Eyes: PER, EOMI, sclera nonicteric.  Neck: Supple, no masses.  No bruit or JVD.  Pulmonary:  Good air movement, clear to auscultation bilaterally, no use of accessory muscles.  Cardiac: RRR, normal S1, S2, no Murmurs. Vascular: early charcot foot deformities ulcer medial/plantar aspect of the left great toe noninfected bone exposed Vessel Right Left  Radial Palpable Palpable  PT Not Palpable Not Palpable  DP Not Palpable Not Palpable  Gastrointestinal: soft, non-distended. No guarding/no peritoneal signs.  Musculoskeletal: M/S 5/5 throughout.  No deformity or atrophy.  Neurologic: CN 2-12 intact. Pain and light touch intact in extremities.  Symmetrical.  Speech is fluent. Motor exam as listed above. Psychiatric: Judgment intact, Mood & affect appropriate for  pt's clinical situation. Dermatologic: left great toe ulcers noted.  No changes consistent with cellulitis. Lymph : No Cervical lymphadenopathy, no lichenification or skin changes of chronic lymphedema.  CBC Lab Results  Component Value Date   WBC 7.7 02/26/2019   HGB 14.6 02/26/2019   HCT 44.1 02/26/2019   MCV 89.8 02/26/2019   PLT 491 (H) 02/26/2019    BMET    Component Value Date/Time   NA 137 02/26/2019 1139   K 4.5 02/26/2019 1139   CL 97 (L) 02/26/2019 1139   CO2 26 02/26/2019 1139   GLUCOSE 151 (H) 02/26/2019 1139   BUN 23 02/26/2019 1139   CREATININE 1.54 (H) 02/26/2019 1139   CALCIUM 10.0 02/26/2019 1139   GFRNONAA >90 04/29/2014 0830   GFRAA >90 04/29/2014 0830   Estimated Creatinine Clearance: 57.9 mL/min (A) (by C-G formula based on SCr of 1.54 mg/dL (H)).  COAG Lab Results  Component Value Date   INR 0.99 04/20/2014    Radiology Dg Foot Complete Left  Result Date: 02/12/2019 CLINICAL DATA:  43 year old female with nonhealing wound of the left great toe. EXAM: LEFT FOOT - COMPLETE 3+ VIEW COMPARISON:  Prior radiographs of the left ankle and foot 09/28/2018 FINDINGS: Interval development of  osteolysis along the medial aspect of the tip of the distal phalanx of the great toe. The surrounding soft tissues are edematous and irregular consistent with the clinical history of wound. Additionally, the patient has suffered a minimally displaced fracture through the neck of the fourth metatarsal. There is bridging callus consistent with healing. This is new compared to the prior imaging from December of 2019. Incompletely imaged surgical changes of prior ORIF of a bimalleolar fracture. 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 IMPRESSION: 1. New osteolysis along the medial aspect of the tuft of the distal phalanx of the great toe concerning for osteomyelitis. 2. Subacute healing/healed fracture through the neck of the fourth metatarsal is new compared to 09/28/2018. Electronically Signed   By: Jacqulynn Cadet M.D.   On: 02/12/2019 11:59      Assessment/Plan 1. Atherosclerosis of native arteries of the extremities with ulceration ([place])  Recommend:  The patient has evidence of severe atherosclerotic changes of both lower extremities associated with ulceration and tissue loss of the left foot.  This represents a limb threatening ischemia and places the patient at the risk for left limb loss.  Patient should undergo angiography of the left lower extremities with the hope for intervention for limb salvage.  The risks and benefits as well as the alternative therapies was discussed in detail with the patient.  All questions were answered.  Patient agrees to proceed with angiography.  The patient will follow up with me in the office after the procedure.    2. Type 2 diabetes mellitus with hyperosmolarity without coma, unspecified whether long term insulin use ([place]) Continue hypoglycemic medications as already ordered, these medications have been reviewed and there are no changes at this time.  Hgb A1C to be monitored as already arranged by primary service     [name], MD   03/04/2019 3:49 PM

## 2019-03-05 ENCOUNTER — Encounter (INDEPENDENT_AMBULATORY_CARE_PROVIDER_SITE_OTHER): Payer: Self-pay

## 2019-03-05 ENCOUNTER — Encounter: Payer: Medicaid Other | Admitting: Physician Assistant

## 2019-03-05 DIAGNOSIS — E11621 Type 2 diabetes mellitus with foot ulcer: Secondary | ICD-10-CM | POA: Diagnosis not present

## 2019-03-07 ENCOUNTER — Telehealth (INDEPENDENT_AMBULATORY_CARE_PROVIDER_SITE_OTHER): Payer: Self-pay

## 2019-03-07 ENCOUNTER — Other Ambulatory Visit (INDEPENDENT_AMBULATORY_CARE_PROVIDER_SITE_OTHER): Payer: Self-pay | Admitting: Nurse Practitioner

## 2019-03-07 NOTE — Progress Notes (Signed)
ANAHIT, KLUMB ([id number]) Visit Report for 03/05/2019 Arrival Information Details Patient Name: [name], [name] Date of Service: 03/05/2019 1:45 PM Medical Record Number: [id number] Patient Account Number: [id number] Date of Birth/Sex: [birth year] (43 y.o. F) Treating RN: [name] Primary Care [name]: [name] Other Clinician: Referring [name]: [name] Treating Amybeth Sieg/Extender: [name], [name] in Treatment: 30 Visit Information History Since Last Visit Added or deleted any medications: No Patient Arrived: Wheel Chair Any new allergies or adverse reactions: No Arrival Time: 14:00 Had a fall or experienced change in No activities of daily living that may affect Accompanied By: mother risk of falls: Transfer Assistance: None Signs or symptoms of abuse/neglect since last visito No Patient Identification Verified: Yes Hospitalized since last visit: No Secondary Verification Process Completed: Yes Has Dressing in Place as Prescribed: Yes Patient Requires Transmission-Based No Pain Present Now: No Precautions: Patient Has Alerts: No Electronic Signature(s) Signed: 03/05/2019 3:55:39 PM By: [name] Entered By: [name] on 03/05/2019 14:01:52 [name], [name] ([id number]) -------------------------------------------------------------------------------- Clinic Level of Care Assessment Details Patient Name: Heather Dear A. Date of Service: 03/05/2019 1:45 PM Medical Record Number: [id number] Patient Account Number: [id number] Date of Birth/Sex: 07-19-1976 (43 y.o. F) Treating RN: [name] Primary Care Thaddus Mcdowell: [name] Other Clinician: Referring [name]: [name] Treating Jailin Manocchio/Extender: [name], [name] in Treatment: 30 Clinic Level of Care Assessment Items TOOL 4 Quantity Score []  - Use when only an EandM is performed on FOLLOW-UP visit 0 ASSESSMENTS - Nursing Assessment / Reassessment X - Reassessment of  Co-morbidities (includes updates in patient status) 1 10 X- 1 5 Reassessment of Adherence to Treatment Plan ASSESSMENTS - Wound and Skin Assessment / Reassessment X - Simple Wound Assessment / Reassessment - one wound 1 5 []  - 0 Complex Wound Assessment / Reassessment - multiple wounds []  - 0 Dermatologic / Skin Assessment (not related to wound area) ASSESSMENTS - Focused Assessment []  - Circumferential Edema Measurements - multi extremities 0 []  - 0 Nutritional Assessment / Counseling / Intervention X- 1 5 Lower Extremity Assessment (monofilament, tuning fork, pulses) []  - 0 Peripheral Arterial Disease Assessment (using hand held doppler) ASSESSMENTS - Ostomy and/or Continence Assessment and Care []  - Incontinence Assessment and Management 0 []  - 0 Ostomy Care Assessment and Management (repouching, etc.) PROCESS - Coordination of Care X - Simple Patient / Family Education for ongoing care 1 15 []  - 0 Complex (extensive) Patient / Family Education for ongoing care X- 1 10 Staff obtains Programmer, systems, Records, Test Results / Process Orders []  - 0 Staff telephones HHA, Nursing Homes / Clarify orders / etc []  - 0 Routine Transfer to another Facility (non-emergent condition) []  - 0 Routine Hospital Admission (non-emergent condition) []  - 0 New Admissions / Biomedical engineer / Ordering NPWT, Apligraf, etc. []  - 0 Emergency Hospital Admission (emergent condition) X- 1 10 Simple Discharge Coordination [name], [name] ([id number]) []  - 0 Complex (extensive) Discharge Coordination PROCESS - Special Needs []  - Pediatric / Minor Patient Management 0 []  - 0 Isolation Patient Management []  - 0 Hearing / Language / Visual special needs []  - 0 Assessment of Community assistance (transportation, D/C planning, etc.) []  - 0 Additional assistance / Altered mentation []  - 0 Support Surface(s) Assessment (bed, cushion, seat, etc.) INTERVENTIONS - Wound Cleansing / Measurement X -  Simple Wound Cleansing - one wound 1 5 []  - 0 Complex Wound Cleansing - multiple wounds X- 1 5 Wound Imaging (photographs - any number of wounds) []  - 0  Wound Tracing (instead of photographs) X- 1 5 Simple Wound Measurement - one wound []  - 0 Complex Wound Measurement - multiple wounds INTERVENTIONS - Wound Dressings X - Small Wound Dressing one or multiple wounds 1 10 []  - 0 Medium Wound Dressing one or multiple wounds []  - 0 Large Wound Dressing one or multiple wounds []  - 0 Application of Medications - topical []  - 0 Application of Medications - injection INTERVENTIONS - Miscellaneous []  - External ear exam 0 []  - 0 Specimen Collection (cultures, biopsies, blood, body fluids, etc.) []  - 0 Specimen(s) / Culture(s) sent or taken to Lab for analysis []  - 0 Patient Transfer (multiple staff / Civil Service fast streamer / Similar devices) []  - 0 Simple Staple / Suture removal (25 or less) []  - 0 Complex Staple / Suture removal (26 or more) []  - 0 Hypo / Hyperglycemic Management (close monitor of Blood Glucose) []  - 0 Ankle / Brachial Index (ABI) - do not check if billed separately X- 1 5 Vital Signs [name], [name] ([id number]) Has the patient been seen at the hospital within the last three years: Yes Total Score: 90 Level Of Care: New/Established - Level 3 Electronic Signature(s) Signed: 03/05/2019 4:10:04 PM By: [name] Entered By: [name] on 03/05/2019 15:10:50 [name] ([id number]) -------------------------------------------------------------------------------- Encounter Discharge Information Details Patient Name: Heather Dear A. Date of Service: 03/05/2019 1:45 PM Medical Record Number: [id number] Patient Account Number: [id number] Date of Birth/Sex: 04-30-1976 (43 y.o. F) Treating RN: [name] Primary Care Shenoa Hattabaugh: [name] Other Clinician: Referring Alic Hilburn: [name] Treating Kayin Kettering/Extender: [name], [name] in Treatment:  30 Encounter Discharge Information Items Discharge Condition: Stable Ambulatory Status: Wheelchair Discharge Destination: Home Transportation: Private Auto Accompanied By: self Schedule Follow-up Appointment: Yes Clinical Summary of Care: Electronic Signature(s) Signed: 03/05/2019 3:54:53 PM By: [name] Entered By: [name] on 03/05/2019 15:54:53 [name], [name] Kitchen ([id number]) -------------------------------------------------------------------------------- Lower Extremity Assessment Details Patient Name: Heather Dear A. Date of Service: 03/05/2019 1:45 PM Medical Record Number: [id number] Patient Account Number: [id number] Date of Birth/Sex: 1976/07/11 (43 y.o. F) Treating RN: [name] Primary Care Corisa Montini: [name] Other Clinician: Referring Asja Frommer: [name] Treating Verlee Pope/Extender: STONE III, [name] in Treatment: 30 Vascular Assessment Pulses: Dorsalis Pedis Palpable: [Left:No] Posterior Tibial Palpable: [Left:No] Electronic Signature(s) Signed: 03/05/2019 3:55:39 PM By: [name] Entered By: [name] on 03/05/2019 14:14:31 [name], [name] ([id number]) -------------------------------------------------------------------------------- Multi Wound Chart Details Patient Name: Heather Dear A. Date of Service: 03/05/2019 1:45 PM Medical Record Number: [id number] Patient Account Number: [id number] Date of Birth/Sex: Jul 25, 1976 (43 y.o. F) Treating RN: [name] Primary Care Nidal Rivet: [name] Other Clinician: Referring Jessika Rothery: [name] Treating Maleigh Bagot/Extender: STONE III, [name] in Treatment: 30 Vital Signs Height(in): 69 Pulse(bpm): 99 Weight(lbs): 240 Blood Pressure(mmHg): 96/63 Body Mass Index(BMI): 35 Temperature(F): 98.2 Respiratory Rate 18 (breaths/min): Photos: [N/A:N/A] Wound Location: Left Toe Great N/A N/A Wounding Event: Gradually Appeared N/A N/A Primary Etiology: Diabetic  Wound/Ulcer of the N/A N/A Lower Extremity Comorbid History: Chronic sinus N/A N/A problems/congestion, Middle ear problems, Type II Diabetes Date Acquired: 11/16/2018 N/A N/A [name] of Treatment: 14 N/A N/A Wound Status: Open N/A N/A Measurements L x W x D 3x2x0.6 N/A N/A (cm) Area (cm) : 4.712 N/A N/A Volume (cm) : 2.827 N/A N/A % Reduction in Area: -1099.00% N/A N/A % Reduction in Volume: -1700.60% N/A N/A Position 1 (o'clock): 6 Maximum Distance 1 (cm): 0.5 Starting Position 1 10 (o'clock): Ending Position 1 5 (o'clock): Maximum Distance 1 (  cm): 0.6 Tunneling: Yes N/A N/A Undermining: Yes N/A N/A Classification: Grade 3 N/A N/A Exudate Amount: Medium N/A N/A Exudate Type: Serous N/A N/A Exudate Color: amber N/A N/A [name], [name] ([id number]) Wound Margin: Epibole N/A N/A Granulation Amount: Small (1-33%) N/A N/A Granulation Quality: Pink, Hyper-granulation N/A N/A Necrotic Amount: Large (67-100%) N/A N/A Exposed Structures: Fat Layer (Subcutaneous N/A N/A Tissue) Exposed: Yes Muscle: Yes Bone: Yes Fascia: No Tendon: No Joint: No Epithelialization: None N/A N/A Periwound Skin Texture: Callus: Yes N/A N/A Excoriation: No Induration: No Crepitus: No Rash: No Scarring: No Periwound Skin Moisture: Maceration: Yes N/A N/A Dry/Scaly: No Periwound Skin Color: Erythema: Yes N/A N/A Rubor: Yes Atrophie Blanche: No Cyanosis: No Ecchymosis: No Hemosiderin Staining: No Mottled: No Pallor: No Erythema Location: Circumferential N/A N/A Temperature: Hot N/A N/A Tenderness on Palpation: No N/A N/A Treatment Notes Electronic Signature(s) Signed: 03/05/2019 4:10:04 PM By: [name] Entered By: [name] on 03/05/2019 15:08:32 [name] ([id number]) -------------------------------------------------------------------------------- [place] Details Patient Name: Heather Dear A. Date of Service: 03/05/2019 1:45 PM Medical  Record Number: [id number] Patient Account Number: [id number] Date of Birth/Sex: May 15, 1976 (43 y.o. F) Treating RN: [name] Primary Care Liann Spaeth: [name] Other Clinician: Referring Theordore Cisnero: [name] Treating Israel Werts/Extender: STONE III, [name] in Treatment: 30 Active Inactive Abuse / Safety / Falls / Self Care Management Nursing Diagnoses: History of Falls Goals: Patient will not experience any injury related to falls Date Initiated: 08/07/2018 Target Resolution Date: 10/19/2018 Goal Status: Active Interventions: Assess fall risk on admission and as needed Notes: Necrotic Tissue Nursing Diagnoses: Impaired tissue integrity related to necrotic/devitalized tissue Goals: Necrotic/devitalized tissue will be minimized in the wound bed Date Initiated: 02/28/2019 Target Resolution Date: 03/28/2019 Goal Status: Active Interventions: Assess patient pain level pre-, during and post procedure and prior to discharge Treatment Activities: Apply topical anesthetic as ordered : 02/28/2019 Notes: Orientation to the Wound Care Program Nursing Diagnoses: Knowledge deficit related to the wound healing center program Goals: Patient/caregiver will verbalize understanding of the [facility] Date Initiated: 08/07/2018 Target Resolution Date: 10/19/2018 Goal Status: Active [name], [name] ([id number]) Interventions: Provide education on orientation to the wound center Notes: Soft Tissue Infection Nursing Diagnoses: Impaired tissue integrity Goals: Patient will remain free of wound infection Date Initiated: 08/07/2018 Target Resolution Date: 10/19/2018 Goal Status: Active Interventions: Assess signs and symptoms of infection every visit Notes: Wound/Skin Impairment Nursing Diagnoses: Impaired tissue integrity Goals: Ulcer/skin breakdown will heal within 14 [name] Date Initiated: 08/07/2018 Target Resolution Date: 10/19/2018 Goal Status:  Active Interventions: Assess patient/caregiver ability to obtain necessary supplies Notes: Electronic Signature(s) Signed: 03/05/2019 4:10:04 PM By: [name] Entered By: [name] on 03/05/2019 15:08:24 [name] ([id number]) -------------------------------------------------------------------------------- Pain Assessment Details Patient Name: Heather Dear A. Date of Service: 03/05/2019 1:45 PM Medical Record Number: [id number] Patient Account Number: [id number] Date of Birth/Sex: April 17, 1976 (43 y.o. F) Treating RN: [name] Primary Care Mitsuye Schrodt: [name] Other Clinician: Referring Loyce Klasen: [name] Treating Fidelis Loth/Extender: STONE III, [name] in Treatment: 30 Active Problems Location of Pain Severity and Description of Pain Patient Has Paino No Site Locations Pain Management and Medication Current Pain Management: Electronic Signature(s) Signed: 03/05/2019 3:55:39 PM By: [name] Entered By: [name] on 03/05/2019 14:02:46 [name] ([id number]) -------------------------------------------------------------------------------- Patient/Caregiver Education Details Patient Name: Heather Dear A. Date of Service: 03/05/2019 1:45 PM Medical Record Number: [id number] Patient Account Number: [id number] Date of Birth/Gender: 1976-07-12 (43 y.o. F) Treating RN: [name] Primary Care  Physician: [name] Other Clinician: Referring Physician: Priscille [name] Treating Physician/Extender: [name], [name] in Treatment: 30 Education Assessment Education Provided To: Patient Education Topics Provided Offloading: Handouts: Other: continue offloading toe Methods: Explain/Verbal Responses: State content correctly Electronic Signature(s) Signed: 03/05/2019 4:10:04 PM By: [name] Entered By: [name] on 03/05/2019 15:11:46 [name], [name]  ([id number]) -------------------------------------------------------------------------------- Wound Assessment Details Patient Name: Heather Dear A. Date of Service: 03/05/2019 1:45 PM Medical Record Number: [id number] Patient Account Number: [id number] Date of Birth/Sex: 01-Mar-1976 (43 y.o. F) Treating RN: [name] Primary Care Errika Narvaiz: [name] Other Clinician: Referring Valleri Hendricksen: [name] Treating Malayna Noori/Extender: STONE III, [name] in Treatment: 30 Wound Status Wound Number: 5 Primary Diabetic Wound/Ulcer of the Lower Extremity Etiology: Wound Location: Left Toe Great Wound Open Wounding Event: Gradually Appeared Status: Date Acquired: 11/16/2018 Comorbid Chronic sinus problems/congestion, Middle ear [name] Of Treatment: 14 History: problems, Type II Diabetes Clustered Wound: No Photos Wound Measurements Length: (cm) 3 % Reduction Width: (cm) 2 % Reduction Depth: (cm) 0.6 Epithelializ Area: (cm) 4.712 Tunneling: Volume: (cm) 2.827 Position Maximum D in Area: -1099% in Volume: -1700.6% ation: None Yes (o'clock): 6 istance: (cm) 0.5 Undermining: Yes Starting Position (o'clock): 10 Ending Position (o'clock): 5 Maximum Distance: (cm) 0.6 Wound Description Classification: Grade 3 Foul Odor A Wound Margin: Epibole Slough/Fibr Exudate Amount: Medium Exudate Type: Serous Exudate Color: amber fter Cleansing: No ino Yes Wound Bed Granulation Amount: Small (1-33%) Exposed Structure Granulation Quality: Pink, Hyper-granulation Fascia Exposed: No Necrotic Amount: Large (67-100%) Fat Layer (Subcutaneous Tissue) Exposed: Yes [name], [name] ([id number]) Necrotic Quality: Adherent Slough Tendon Exposed: No Muscle Exposed: Yes Necrosis of Muscle: Yes Joint Exposed: No Bone Exposed: Yes Periwound Skin Texture Texture Color No Abnormalities Noted: No No Abnormalities Noted: No Callus: Yes Atrophie Blanche: No Crepitus: No Cyanosis:  No Excoriation: No Ecchymosis: No Induration: No Erythema: Yes Rash: No Erythema Location: Circumferential Scarring: No Hemosiderin Staining: No Mottled: No Moisture Pallor: No No Abnormalities Noted: No Rubor: Yes Dry / Scaly: No Maceration: Yes Temperature / Pain Temperature: Hot Treatment Notes Wound #5 (Left Toe Great) Notes silver alginate, foam, conform, medipore tape. Electronic Signature(s) Signed: 03/05/2019 3:55:39 PM By: [name] Entered By: [name] on 03/05/2019 14:14:07 [name] ([id number]) -------------------------------------------------------------------------------- [place] Details Patient Name: Heather Dear A. Date of Service: 03/05/2019 1:45 PM Medical Record Number: [id number] Patient Account Number: [id number] Date of Birth/Sex: February 05, 1976 (42 y.o. F) Treating RN: [name] Primary Care Jocelyne Reinertsen: [name] Other Clinician: Referring Nashly Olsson: [name] Treating Gloriajean Okun/Extender: STONE III, [name] in Treatment: 30 Vital Signs Time Taken: 14:00 Temperature (F): 98.2 Height (in): 69 Pulse (bpm): 99 Weight (lbs): 240 Respiratory Rate (breaths/min): 18 Body Mass Index (BMI): 35.4 Blood Pressure (mmHg): 96/63 Reference Range: 80 - 120 mg / dl Electronic Signature(s) Signed: 03/05/2019 3:55:39 PM By: [name] Entered By: [name] on 03/05/2019 14:04:08

## 2019-03-07 NOTE — Telephone Encounter (Signed)
Spoke with the patient and went over the pre-procedure instructions, regarding her leg angio on 03/12/2019 with a 9:00 am arrival time with Dr. Delana Meyer. Patient was also told about the zero visitor policy and that she would need to have Covid testing on Friday 03/08/2019. I also explained that without the testing her procedure will be canceled.

## 2019-03-07 NOTE — Progress Notes (Signed)
[name], [name] ([id number]) Visit Report for 03/05/2019 Chief Complaint Document Details Patient Name: [name], [name] A. Date of Service: 03/05/2019 1:45 PM Medical Record Number: [id number] Patient Account Number: [id number] Date of Birth/Sex: 11/15/1975 (43 y.o. F) Treating RN: Montey Hora Primary Care Provider: Priscille Kluver Other Clinician: Referring Provider: Priscille Kluver Treating Provider/Extender: Melburn Hake, Darrall Strey Weeks in Treatment: 30 Information Obtained from: Patient Chief Complaint Left great toe ulcer Electronic Signature(s) Signed: 03/07/2019 3:58:08 AM By: Worthy Keeler PA-C Entered By: Worthy Keeler on 03/05/2019 13:44:56 Surowiec, Jalissa AMarland Kitchen ([id number]) -------------------------------------------------------------------------------- HPI Details Patient Name: Heather Dear A. Date of Service: 03/05/2019 1:45 PM Medical Record Number: [id number] Patient Account Number: [id number] Date of Birth/Sex: 04/05/1976 (43 y.o. F) Treating RN: Montey Hora Primary Care Provider: Priscille Kluver Other Clinician: Referring Provider: Priscille Kluver Treating Provider/Extender: Melburn Hake, Mayia Megill Weeks in Treatment: 30 History of Present Illness HPI Description: 01/18/18-She is here for initial evaluation of the left great toe ulcer. She is a poor historian in regards to timeframe in detail. She states approximately 4 weeks ago she lacerated her toe on something in the house. She followed up with her primary care who placed her on Bactrim and ultimately a second dose of Bactrim prior to coming to wound clinic. She states she has been treating the toe with peroxide, Betadine and a Band-Aid. She did not check her blood sugar this morning but checked it yesterday morning it was 327; she is unaware of a recent A1c and there are no current records. She saw Dr. she would've orthopedics last week for an old injury to the left ankle, she states he did not see her toe, nor did she bring it to his  attention. She smokes approximately 1 pack cigarettes a day. Her social situation is concerning, she arrives this morning with her mother who appears extremely intoxicated/under the influence; her mother was asked to leave the room and be monitored by the patient's grandmother. The patient's aunt then accompanied the patient and the room throughout the rest of the appointment. We had a lengthy discussion regarding the deleterious effects of uncontrolled hyperglycemia and smoking as it relates to wound healing and overall health. She was strongly encouraged to decrease her smoking and get her diabetes under better control. She states she is currently on a diet and has cut down her [facility] consumption. The left toe is erythematous, macerated and slightly edematous with malodor present. The edema in her left foot is below her baseline, there is no erythema streaking. We will treat her with Santyl, doxycycline; we have ordered and xray, culture and provided a Peg assist surgical shoe and cultured the wound. 01/25/18-She is here in follow-up evaluation for a left great toe ulcer and presents with an abscess to her suprapubic area. She states her blood sugars remain elevated, feeling "sick" and if levels are below 250, but she is trying. She has made no attempt to decrease her smoking stating that we "can't take away her food in her cigarettes". She has been compliant with offloading using the PEG assist you. She is using Santyl daily. the culture obtained last week grew staph aureus and Enterococcus faecalis; continues on the doxycycline and Augmentin was added on Monday. The suprapubic area has erythema, no femoral variation, purple discoloration, minimal induration, was accessed with a cotton tip applicator with sanguinopurulent drainage, this was cultured, I suspect the current antibiotic treatment will cover and we will not add anything to her current treatment plan. She was advised  to go to urgent  care or ER with any change in redness, induration or fever. 02/01/18-She is here in follow-up evaluation for left great toe ulcers and a new abdominal abscess from last week. She was able to use packing until earlier this week, where she "forgot it was there". She states she was feeling ill with GI symptoms last week and was not taking her antibiotic. She states her glucose levels have been predominantly less than 200, with occasional levels between 200-250. She thinks this was contributing to her GI symptoms as they have resolved without intervention. There continues to be significant laceration to left toe, otherwise it clinically looks stable/improved. There is now less superficial opening to the lateral aspect of the great toe that was residual blister. We will transition to [facility] to all wounds, she will continue her Augmentin. If there is no change or deterioration next week for reculture. 02/08/18-She is here in follow-up evaluation for left great toe ulcer and abdominal ulcer. There is an improvement in both wounds. She has been wrapping her left toe with coban, not by our direction, which has created an area of discoloration to the medial aspect; she has been advised to NOT use coban secondary to her neuropathy. She states her glucose levels have been high over this last week ranging from 200-350, she continues to smoke. She admits to being less compliant with her offloading shoe. We will continue with same treatment plan and she will follow-up next week. 02/15/18-She is here in follow-up evaluation for left great toe ulcer and abdominal ulcer. The abdominal ulcer is epithelialized. The left great toe ulcer is improved and all injury from last week using the Coban wrap is resolved, the lateral ulcer is healed. She admits to noncompliance with wearing offloading shoe and admits to glucose levels being greater than 300 most of the week. She continues to smoke and expresses no desire to  quit. There is one area medially that probes deeper than it has historically, erythema to the toe and dorsal foot has consistently waxed and waned. There is no overt signs of cellulitis or infection but we will culture the wound for any occult infection given the new area of depth and erythema. We will hold off on sensitivities for initiation of antibiotic therapy. 02/22/18-She is here in follow up evaluation for left great toe ulcer. There is overall significant improvement in both wound appearance, erythema and edema with changes made last week. She was not initiated on antibiotic therapy. Culture obtained last week showed oxacillin sensitive staph aureus, sensitive to clindamycin. Clindamycin has been called into the pharmacy but she has been instructed to hold off on initiation secondary to overall clinical improvement and her history of antibiotic intolerance. She has been instructed to contact the clinic with any noted changes/deterioration and the wound, erythema, Caggiano, Shyenne A. ([id number]) edema and/or pain. She will follow-up next week. She continues to smoke and her glucose levels remain elevated >250; she admits to compliance with offloading shoe 03/01/18 on evaluation today patient appears to be doing fairly well in regard to her left first toe ulcer. She has been tolerating the dressing changes with the [facility] Dressing without complication and overall this has definitely showed signs of improvement according to records as well is what the patient tells me today. I'm very pleased in that regard. She is having no pain today 03/08/18 She is here for follow up evaluation of a left great toe ulcer. She remains non-compliant with glucose control  and smoking cessation; glucose levels consistently >200. She states that she got new shoe inserts/peg assist. She admits to compliance with offloading. Since my last evaluation there is significant improvement. We will switch to prisma at this  time and she will follow up next week. She is noted to be tachycardic at this appointment, heart rate 120s; she has a history of heart rate 70-130 according to our records. She admits to extreme agitation r/t personal issues; she was advised to monitor her heartrate and contact her physician if it does not return to a more normal range (<100). She takes cardizem twice daily. 03/15/18-She is here in follow-up evaluation for left great toe ulcer. She remains noncompliant with glucose control and smoking cessation. She admits to compliance with wearing offloading shoe. The ulcer is improved/stable and we will continue with the same treatment plan and she will follow-up next week 03/22/18-She is here for evaluation for left great toe ulcer. There continues to be significant improvement despite recurrent hyperglycemia (over 500 yesterday) and she continues to smoke. She has been compliant with offloading and we will continue with same treatment plan and she will follow-up next week. 03/29/18-She is here for evaluation for left great toe ulcer. Despite continuing to smoke and uncontrolled diabetes she continues to improve. She is compliant with offloading shoe. We will continue with the same treatment plan and she will follow-up next week 04/05/18- She is here in follow up evaluation for a left great toe ulcer; she presents with small pustule to left fifth toe (resembles ant bite). She admits to compliance with wearing offloading shoe; continues to smoke or have uncontrolled blood glucose control. There is more callus than usual with evidence of bleeding; she denies known trauma. 04/12/18-She is here for evaluation of left great toe ulcer. Despite noncompliance with glycemic control and smoking she continues to make improvement. She continues to wear offloading shoe. The pustule, that was identified last week, to the left fifth toe is resolved. She will follow-up in 2 weeks 05/03/18-she is seen in follow-up  evaluation for a left great toe ulcer. She is compliant with offloading, otherwise noncompliant with glycemic control and smoking. She has plateaued and there is minimal improvement noted. We will transition to [facility], replaced the insert to her surgical shoe and she will follow-up in one week 05/10/18- She is here in follow up evaluation for a left great toe ulcer. It appears stable despite measurement change. We will continue with same treatment plan and follow up next week. 05/24/18-She is seen in follow-up evaluation for a left great toe ulcer. She remains compliant with offloading, has made significant improvement in her diet, decreasing the amount of sugar/soda. She said her recent A1c was 10.9 which is lower than. She did see a diabetic nutritionist/educator yesterday. She continues to smoke. We will continue with the same treatment plan and she'll follow-up next week. 05/31/18- She is seen in follow-up evaluation for left great toe ulcer. She continues to remain compliant with offloading, continues to make improvement in her diet, increasing her water and decreasing the amount of sugar/soda. She does continue to smoke with no desire to quit. We will apply Prisma to the depth and Hydrofera Blue over. We have not received insurance authorization for oasis. She will follow up next week. 06/07/18-She is seen in follow-up evaluation for left great toe ulcer. It has stalled according to today's measurements although base appears stable. She says she saw a diabetic educator yesterday; her average blood sugars are  less than 300 which is an improvement for her. She continues to smoke and states "that's my next step" She continues with water over soda. We will order for xray, culture and reinstate ace wrap compression prior to placing apligraf for next week. She is voicing no complaints or concerns. Her dressing will change to iodoflex over the next week in preparation for apligraf. 06/14/18-She is  seen in follow-up evaluation for left great toe ulcer. Plain film x-ray performed last week was negative for osteomyelitis. Wound culture obtained last week grew strep B and OSSA; she is initiated on keflex and cefdinir today; there is erythema to the toe which could be from ace wrap compression, she has a history of wrapping too tight and has has been encouraged to maintain ace wraps that we place today. We will hold off on application of apligraf today, will apply next week after antibiotic therapy has been initiated. She admits today that she has resumed taking a shower with her foot/toe submerged in water, she has been reminded to keep foot/toe out of the bath water. She will be seen in follow up next week 06/21/18-she is seen in follow-up evaluation for left great toe ulcer. She is tolerating antibiotic therapy with no GI disturbance. The wound is stable. Apligraf was applied today. She has been decreasing her smoking, only had 4 cigarettes yesterday and 1 today. She continues being more compliant in diabetic diet. She will follow-up next week for evaluation of site, if stable will remove at 2 weeks. 06/28/18- She is here in follow up evalution. Apligraf was placed last week, she states the dressing fell off on Tuesday and she was dressing with hydrofera blue. She is healed and will be discharged from the clinic today. She has been instructed to continue with smoking cessation, continue monitoring glucose levels, offloading for an additional 4 weeks and continue with Terpening, Taetum A. ([id number]) hydrofera blue for additional two weeks for any possible microscopic opening. Readmission: 08/07/18 on evaluation today patient presents for reevaluation concerning the ulcer of her right great toe. She was previously discharged on 06/28/18 healed. Nonetheless she states that this began to show signs of drainage she subsequently went to her primary care provider. Subsequently an x-ray was performed on  08/01/18 which was negative. The patient was also placed on antibiotics at that time. Fortunately they should have been effective for the infection. Nonetheless she's been experiencing some improvement but still has a lot of drainage coming from the wound itself. 08/14/18 on evaluation today patient's wound actually does show signs of improvement in regard to the erythema at this point. She has completed the antibiotics. With that being said we did discuss the possibility of placing her in a total contact cast as of today although I think that I may want to give this just a little bit more time to ensure nothing recurrence as far as her infection is concerned. I do not want to put in the cast and risk infection at that time if things are not completely resolved. With that being said she is gonna require some debridement today. 08/21/18 on evaluation today patient actually appears to be doing okay in regard to her toe ulcer. She's been tolerating the dressing changes without complication. With that being said it does appear that she is ready and in fact I think it's appropriate for [place] to go ahead and initiate the total contact cast today. Nonetheless she will require some sharp debridement to prepare the wound for application. Overall  I feel like things have been progressing well but we do need to do something to get this to close more readily. 08/24/18 patient seen today for reevaluation after having had the total contact cast applied on Tuesday. She seems to have done very well the wound appears to be doing great and overall I'm pleased with the progress that she's made. There were no abnormal areas of rubbing from the cast on her lower extremity. 08/30/18 on evaluation today patient actually appears to be completely healed in regard to her plantar toe ulcer. She tells me at this point she's been having a lot of issues with the cast. She almost fell a couple of times the state shall the step of her  dog a couple times as well. This is been a very frustrating process for her other nonetheless she has completely healed the wound which is excellent news. Overall there does not appear to be the evidence of infection at this time which is great news. 09/11/18 evaluation today patient presents for follow-up concerning her great toe ulcer on the left which has unfortunately reopened since I last saw her which was only a couple of weeks ago. Unfortunately she was not able to get in to get the shoe and potentially the AFO that's gonna be necessary due to her left foot drop. She continues with offloading shoe but this is not enough to prevent her from reopening it appears. When we last had her in the total contact cast she did well from a healing standpoint but unfortunately the wound reopened as soon as she came out of the cast within just a couple of weeks. 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 This is also part of the reason why she falls on a regular basis. I just do not believe that is gonna be safe for [place] to reinitiate the total contact cast as last time we had this on she fell 3 times one week which is definitely not normal for her. 09/18/18 upon evaluation today the patient actually appears to be doing about the same in regard to her toe ulcer. She did not contact [employer] as I asked her to even though I had given her the prescription. In fact she actually states that she has no idea where the prescription is. She did apparently call [employer] and they told her that all she needed to do was bring the prescription in order to be able to be seen and work on getting the AFO for her left foot. With all that being said she still does not have an appointment and I'm not sure were things stand that regard.  I will give her a new prescription today in order to contact them to get this set up. 09/25/18 on evaluation today patient actually appears to be doing about the same in regard to her toes ulcer. She does have a small areas which seems to have a lot of callous buildup around the edge of the wound which is going to need sharp debridement today. She still is waiting to be scheduled for evaluation with [employer] for possibility of an AFO. She states there supposed to call her tomorrow to get this set up. Unfortunately it does appear that her foot specifically the toe area is showing signs  of erythema. There does not appear to be any systemic infection which is in these good news. 10/02/18 on evaluation today patient actually appears to be doing about the same in regard to her toe ulcer. This really has not done too well although it's not significantly larger it's also not significantly smaller. She has been tolerating the dressing changes without complication. She actually has her appointment with [employer] and [employer] tomorrow to hopefully be measured for obtaining and AFO splint. I think this would be helpful preventing this from reoccurring. We had contemplated starting the cast this week although to be honest I am reluctant to do that as she's been having nausea, vomiting, and seizure activity over the past three days. She has a history of seizures and have been told is nothing that can be done for these. With Quinones, Cherrelle A. ([id number]) that being said I do believe that along with the seizures have the nausea vomiting which upon further questioning doesn't seem to be the normal for her and makes me concerned for the possibility of infection or something else going on. I discussed this with the patient and her mother during the office visit today. I do not feel the wound is effective but maybe something else. The responses this was "this just happens to her at times and we don't know why". They did  not seem to be interested in going to the hospital to have this checked out further. 10/09/18 on evaluation today patient presents for follow-up concerning her ongoing toe ulcer. She has been tolerating the dressing changes without complication. Fortunately there does not appear to be any evidence of infection which is great news however I do think that the patient would benefit from going ahead for with the total contact cast. She's actually in a wheelchair today she tells me that she will use her walker if we initiate the cast. I was very specific about the fact that if we were gonna do the cast I wanted to make sure that she was using the walker in order to prevent any falls. She tells me she does not have stairs that she has to traverse on a regular basis at her home. She has not had any seizures since last week again that something that happens to her often she tells me she did talk to [employer] and they said that it may take up to three weeks to get the brace approved for her. Hopefully that will not take that long but nonetheless in the meantime I do think the cast could be of benefit. 10/12/18 on evaluation today patient appears to be doing rather well in regard to her toe ulcer. It's just been a few days and already this is significantly improved both as far as overall appearance and size. Fortunately there's no sign of infection. She is here for her first obligatory cast change. 10/19/18 Seen today for follow up and management of left great toe ulcer. Wound continues to show improvement. Noted small open area with seroussang drainage with palpation. Denies any increased pain or recent fevers during visit. She will continue calcium alginate with offloading shoe. Denies any questions or concerns during visit. 10/26/18 on evaluation today patient appears to be doing about the same as when I last saw her in regard to her wound bed. Fortunately there does not appear to be any signs of infection.  Unfortunately she continues to have a breakdown in regard to the toe region any time that she is not in the cast.  It takes almost no time at all for this to happen. Nonetheless she still has not heard anything from the brace being made by [employer] as to when exactly this will be available to her. Fortunately there is no signs of infection at this time. 10/30/18 on evaluation today patient presents for application of the total contact cast as we just received him this morning. Fortunately we are gonna be able to apply this to her today which is great news. She continues to have no significant pain which is good news. Overall I do feel like things have been improving while she was the cast is when she doesn't have a cast that things get worse. She still has not really heard anything from [place] regarding her brace. 11/02/18 upon evaluation today patient's wound already appears to be doing significantly better which is good news. Fortunately there does not appear to be any signs of infection also good news. Overall I do think the total contact cast as before is helping to heal this area unfortunately it's just not gonna likely keep the area closed and healed without her getting her brace at least. Again the foot drop is a significant issue for her. 11/09/18 on evaluation today patient appears to be doing excellent in regard to her toe ulcer which in fact is completely healed. Fortunately we finally got the situation squared away with the paperwork which was needed to proceed with getting her brace approved by Medicaid. I have filled that out unfortunately that information has been sent to the orthopedic office that I worked at 2 1/2 years ago and not tired Current wound care measures. Fortunately she seems to be doing very well at this time. 11/23/18 on evaluation today patient appears to be doing More Poorly Compared to Last Time I Saw Her. At [facility] She Had Completely Healed. Currently she is  continuing to have issues with reopening. She states that she just found out that the brace was approved through Medicaid now she just has to go get measured in order to have this fitted for her and then made. Subsequently she does not have an appointment for this yet that is going to complicate things we obviously cannot put her back in the cast if we do not have everything measured because they're not gonna be able to measure her foot while she is in the cast. 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 Obviously this is unfortunate and does have me somewhat worried as well. 11/30/18 on evaluation today patient's toe ulcer actually appears to be doing fairly well. The good news is she will be getting her brace in the shoes next week on Wednesday. Hopefully we will be able to get this to heal without having to go back in the cast however she may need the cast in order to get the wound completely heal and then go from there. Fortunately there's no signs of infection at this time. 12/07/18 on evaluation today patient fortunately did receive her brace and she states she could tell this definitely makes her walk better. With that being said she's been having issues with her toe where she noticed yesterday there was a lot of tissue Blitzer, Domonic A. ([id number]) that was loosing off this appears to be much larger than what it was previous. She also states that her leg has been read putting much across the top of  her foot just about the ankle although this seems to be receiving somewhat. The total area is still red and appears to be someone infected as best I can tell. She is previously taken Bactrim and that may be a good option for her today as well. We are gonna see what I wound culture shows as well and I think that this is definitely appropriate. With that being said outside of the culture I still need to  initiate something in the interim and that's what I'm gonna go ahead and select Bactrim is a good option for her. 12/14/18 on evaluation today patient appears to be doing better in regard to her left great toe ulcer as compared to last week's evaluation. There's still some erythema although this is significantly improved which is excellent news. Overall I do believe that she is making good progress is still gonna take some time before she is where I would like her to be from the standpoint of being able to place her back into the total contact cast. Hopefully we will be where we need to be by next week. 12/21/18 on evaluation today patient actually appears to be doing poorly in regard to her toe ulcer. She's been tolerating the dressing changes without complication. Fortunately there's no signs of systemic infection although she does have a lot of drainage from the toe ulcer and this does seem to be causing some issues at this point. She does have erythema on the distal portion of her toe that appears to be likely cellulitis. 12/28/18 on evaluation today patient actually appears to be doing a little better in my pinion in regard to her toe ulcer. With that being said she still does have some evidence of infection at this time and for her culture she had both E. coli as well as enterococcus as organisms noted on evaluation. For that reason I think that though the Keflex likely has treated the E. coli rather well this has really done nothing for the enterococcus. We are going to have to initiate treatment for this specifically. 01/04/19 on evaluation today patient's toe actually appears to be doing better from the standpoint of infection. She currently would like to see about putting the cash back on I think that this is appropriate as long as she takes care of it and keeps it from getting wet. She is gonna have some drainage we can definitely pass this up with Drawtex and alginate to try to prevent as  much drainage as possible from causing the problems. With that being said I do want to at least try her with the cast between now and Tuesday. If there any issues we can't continue to use it then I will discontinue the use of the cast at that point. 01/08/19 on evaluation today patient actually appears to be doing very well as far as her foot ulcer specifically the great toe on the left is concerned. She did have an area of rubbing on the medial aspect of her left ankle which again is from the cast. Fortunately there's no signs of infection at this point in this appears to be a very slight skin breakdown. The patient tells me she felt it rubbing but didn't think it was that bad. Fortunately there is no signs of active infection at this time which is good news. No fevers, chills, nausea, or vomiting noted at this time. 01/15/19 on evaluation today patient actually appears to be doing well in regard to her toe ulcer. Again as  previous she seems to do well and she has the cast on which indicates to me that during the time she doesn't have a cast on she's putting way too much pressure on this region. Obviously I think that's gonna be an issue as with the current national emergency concerning the Covid-19 Virus it has been recommended that we discontinue the use of total contact casting by the chief medical officer of our company, Dr. Simona Huh. The reasoning is that if a patient becomes sick and cannot come into have the cast removed they could not just leave this on for an additional two weeks. Obviously the hospitals also do not want to receive patient's who are sick into the emergency department to potentially contaminate the region and spread the Covid-19 Virus among other sick individuals within the hospital system. Therefore at this point we are suspending the use of total contact cast until the current emergency subsides. This was all discussed with the patient today as well. 01/22/19 on evaluation  today patient's wound on her left great toe appears to be doing slightly worse than previously noted last week. She tells me that she has been on this quite a bit in fact she tells me she's been awake for 38 straight hours. This is due to the fact that she's having to care for grandparents because nobody else will. She has been taking care of them for five the last seven days since I've seen her they both have dementia his is from a stroke and her grandmother's was progressive. Nonetheless she states even her mom who knows her condition and situation has only help two of those days to take care of them she's been taking care of the rest. Fortunately there does not appear to be any signs of active infection in regard to her toe at this point although obviously it doesn't look as good as it did previous. I think this is directly related to her not taking off the pressure and friction by way of taking things easy. Though I completely understand what's going on. 01/29/19 on evaluation today patient's tools are actually appears to be showing some signs of improvement today compared to last week's evaluation as far as not necessarily the overall size of the wound but the fact that she has some new skin growth in between the two ends of the wound opening. Overall I feel like she has done well she states that she had a family member give her what sounds to be a CAM walker boot which has been helpful as well. 02/05/19 on evaluation today patient's wound bed actually appears to be doing significantly better in regard to her overall Piccininni, Loie A. ([id number]) appearance of the size of the wound. With that being said she is still having an issue with offloading efficiently enough to get this to close. Apparently there is some signs of infection at this point as well unfortunately. Previously she's done well of Augmentin I really do not see anything that needs to be culture currently but there theme and  cellulitis of the foot that I'm seeing I'm gonna go ahead and place her on an antibiotic today to try to help clear this up. 02/12/2019 on evaluation today patient actually appears to be doing poorly in regard to her overall wound status. She tells me she has been using her offloading shoe but actually comes in today wearing her tennis shoe with the AFO brace. Again as I previously discussed with her this is really not sufficient  to allow the area to heal appropriately. Nonetheless she continues to be somewhat noncompliant and I do wonder based on what she has told my nurse in the past as to whether or not a good portion of this noncompliance may be recreational drug and alcohol related. She has had a history of heroin overdose and this was fairly recently in the past couple of months that have been seeing her. Nonetheless overall I feel like her wound looks significantly worse today compared to what it was previous. She still has significant erythema despite the Augmentin I am not sure that this is an appropriate medication for her infection I am also concerned that the infection may have gone down into her bone. 02/19/19 on evaluation today patient actually appears to be doing about the same in regard to her toe ulcer. Unfortunately she continues to show signs of bone exposure and infection at this point. There does not appear to be any evidence of worsening of the infection but I'm also not really sure that it's getting significantly better. She is on the Augmentin which should be sufficient for the Staphylococcus aureus infection that she has at this point. With that being said she may need IV antibiotics to more appropriately treat this. We did have a discussion today about hyperbaric option therapy. 02/28/19 on evaluation today patient actually appears to be doing much worse in regard to the wound on her left great toe as compared to even my previous evaluation last week. Unfortunately this seems  to be training in a pretty poor direction. Her toe was actually now starting to angle laterally and I can actually see the entire joint area of the proximal portion of the digit where is the distal portion of the digit again is no longer even in contact with the joint line. Unfortunately there's a lot more necrotic tissue around the edge and the toe appears to be showing signs of becoming gangrenous in my pinion. I'm very concerned about were things stand at this point. She did see infectious disease and they are planning to send in a prescription for Sivextro for her and apparently this has been approved. With that being said I don't think she should avoid taking this but at the same time I'm not sure that it's gonna be sufficient to save her toe at this point. 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 03/05/19 on evaluation today patient actually appears to be doing quite well in regard to her toe all things considering. She still has bone exposed but there appears to be much less your thing on overall the appearance of the wound and the toe itself is dramatically improved. She still does have some issues currently obviously with infection she did see vascular as well and there concerned that her blood flow to the toad. For that reason they are setting up for an angiogram next week. Electronic Signature(s) Signed: 03/07/2019 3:58:08 AM By: Worthy Keeler PA-C Entered By: Worthy Keeler on 03/07/2019 03:27:06 [name] ([id number]) -------------------------------------------------------------------------------- Physical Exam Details Patient Name: Heather Dear A. Date of Service: 03/05/2019 1:45 PM Medical Record Number: [id number] Patient Account Number: [id number] Date of Birth/Sex:  1976/05/05 (43 y.o. F) Treating RN: Montey Hora Primary Care Provider: Priscille Kluver Other Clinician: Referring Provider:  DAVIS, DEBRA Treating Provider/Extender: STONE III, Richey Doolittle Weeks in Treatment: 49 Constitutional Well-nourished and well-hydrated in no acute distress. Respiratory normal breathing without difficulty. clear to auscultation bilaterally. Cardiovascular regular rate and rhythm with normal S1, S2. Psychiatric this patient is able to make decisions and demonstrates good insight into disease process. Alert and Oriented x 3. pleasant and cooperative. Notes Patient's wound bed currently shows evidence of bone exposure at the joint line at this time. Nonetheless she continues to have issues with infection although it does appear that the infection is doing much better than previously noted. No fevers, chills, nausea, or vomiting noted at this time. Electronic Signature(s) Signed: 03/07/2019 3:58:08 AM By: Worthy Keeler PA-C Entered By: Worthy Keeler on 03/07/2019 03:27:37 [name] ([id number]) -------------------------------------------------------------------------------- Physician Orders Details Patient Name: Heather Dear A. Date of Service: 03/05/2019 1:45 PM Medical Record Number: [id number] Patient Account Number: [id number] Date of Birth/Sex: March 31, 1976 (43 y.o. 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 Anesthetic (add to Medication  List) Wound #5 Left Toe Great o Topical Lidocaine 4% cream applied to wound bed prior to debridement (In Clinic Only). Primary Wound Dressing Wound #5 Left Toe Great o Silver Alginate Secondary Dressing Wound #5 Left Toe Great o Conform/Kerlix o Foam Dressing Change Frequency Wound #5 Left Toe Great o Three times weekly Follow-up Appointments Wound #5 Left Toe Great o Return Appointment in 1 week. - Friday 03/15/19 Off-Loading Wound #5 Left Toe Great o Open toe surgical shoe to: - Wear your surgical shoe with your brace. No tennis Shoes! Additional Orders / Instructions Wound #5 Left Toe Great o Increase protein intake. [name], [name] ([id number]) Medications-please add to medication list. Wound #5 Left Toe Great o P.O. Antibiotics Electronic Signature(s) Signed: 03/05/2019 4:10:04 PM By: Montey Hora Signed: 03/07/2019 3:58:08 AM By: Worthy Keeler PA-C Entered By: Montey Hora on 03/05/2019 15:11:19 [name], [name] Kitchen ([id number]) -------------------------------------------------------------------------------- Problem List Details Patient Name: Heather Dear A. Date of Service: 03/05/2019 1:45 PM Medical Record Number: [id number] Patient Account Number: [id number] Date of Birth/Sex: Nov 10, 1975 (43 y.o. 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 ([id number]) -------------------------------------------------------------------------------- Progress Note Details Patient Name: Heather Dear A. Date of Service: 03/05/2019 1:45 PM Medical Record Number: [id number] Patient Account Number: [id number] Date of Birth/Sex: 1976/01/23 (43 y.o. F) Treating RN: Montey Hora Primary Care Provider: Priscille Kluver Other Clinician: Referring Provider:  DAVIS, DEBRA Treating Provider/Extender: STONE III, Atom Solivan Weeks in Treatment: 30 Subjective Chief Complaint Information obtained from Patient Left great toe ulcer History of Present Illness (HPI) 01/18/18-She is here for initial evaluation of the left great toe ulcer. She is a poor historian in regards to timeframe in detail. She states approximately 4 weeks ago she lacerated her toe on something in the house. She followed up with her primary care who placed her on Bactrim and ultimately a second dose of Bactrim prior to coming to wound clinic. She states she has been treating the toe with peroxide, Betadine and a Band-Aid. She did not check her blood sugar this morning but checked it yesterday morning it was 327; she is unaware of a recent A1c and there are no current records. She saw Dr. she would've orthopedics last week for an old injury to the left ankle, she states he did not see her toe, nor did she bring it to his attention. She smokes approximately 1 pack cigarettes a day. Her social situation is concerning, she arrives this morning with her mother who appears extremely intoxicated/under the influence; her mother was asked to leave the room and be monitored by the patient's grandmother. The patient's aunt then accompanied the patient and the room throughout the rest of the appointment. We had  a lengthy discussion regarding the deleterious effects of uncontrolled hyperglycemia and smoking as it relates to wound healing and overall health. She was strongly encouraged to decrease her smoking and get her diabetes under better control. She states she is currently on a diet and has cut down her [facility] consumption. The left toe is erythematous, macerated and slightly edematous with malodor present. The edema in her left foot is below her baseline, there is no erythema streaking. We will treat her with Santyl, doxycycline; we have ordered and xray, culture and provided a Peg assist surgical shoe and cultured the wound. 01/25/18-She is here in follow-up evaluation for a left great toe ulcer and presents with an abscess to her suprapubic area. She states her blood sugars remain elevated, feeling "sick" and if levels are below 250, but she is trying. She has made no attempt to decrease her smoking stating that we "can't take away her food in her cigarettes". She has been compliant with offloading using the PEG assist you. She is using Santyl daily. the culture obtained last week grew staph aureus and Enterococcus faecalis; continues on the doxycycline and Augmentin was added on Monday. The suprapubic area has erythema, no femoral variation, purple discoloration, minimal induration, was accessed with a cotton tip applicator with sanguinopurulent drainage, this was cultured, I suspect the current antibiotic treatment will cover and we will not add anything to her current treatment plan. She was advised to go to urgent care or ER with any change in redness, induration or fever. 02/01/18-She is here in follow-up evaluation for left great toe ulcers and a new abdominal abscess from last week. She was able to use packing until earlier this week, where she "forgot it was there". She states she was feeling ill with GI symptoms last week and was not taking her antibiotic. She states her glucose levels  have been predominantly less than 200, with occasional levels between 200-250. She thinks this was contributing to her GI symptoms as they have resolved without intervention. There continues to be significant laceration to left toe, otherwise it clinically looks stable/improved. There is now less superficial opening to the lateral aspect of the great toe  that was residual blister. We will transition to [facility] to all wounds, she will continue her Augmentin. If there is no change or deterioration next week for reculture. 02/08/18-She is here in follow-up evaluation for left great toe ulcer and abdominal ulcer. There is an improvement in both wounds. She has been wrapping her left toe with coban, not by our direction, which has created an area of discoloration to the medial aspect; she has been advised to NOT use coban secondary to her neuropathy. She states her glucose levels have been high over this last week ranging from 200-350, she continues to smoke. She admits to being less compliant with her offloading shoe. We will continue with same treatment plan and she will follow-up next week. 02/15/18-She is here in follow-up evaluation for left great toe ulcer and abdominal ulcer. The abdominal ulcer is epithelialized. The left great toe ulcer is improved and all injury from last week using the Coban wrap is resolved, the lateral ulcer is healed. She admits to noncompliance with wearing offloading shoe and admits to glucose levels being greater than 300 most of the week. She continues to smoke and expresses no desire to quit. There is one area medially that probes deeper than it has historically, erythema to the toe and dorsal foot has consistently waxed and waned. There is no overt signs of cellulitis or infection but we will culture the wound for any occult infection given the new area of depth and erythema. We will hold off on Dubs, Rozann A. ([id number]) sensitivities for initiation of  antibiotic therapy. 02/22/18-She is here in follow up evaluation for left great toe ulcer. There is overall significant improvement in both wound appearance, erythema and edema with changes made last week. She was not initiated on antibiotic therapy. Culture obtained last week showed oxacillin sensitive staph aureus, sensitive to clindamycin. Clindamycin has been called into the pharmacy but she has been instructed to hold off on initiation secondary to overall clinical improvement and her history of antibiotic intolerance. She has been instructed to contact the clinic with any noted changes/deterioration and the wound, erythema, edema and/or pain. She will follow-up next week. She continues to smoke and her glucose levels remain elevated >250; she admits to compliance with offloading shoe 03/01/18 on evaluation today patient appears to be doing fairly well in regard to her left first toe ulcer. She has been tolerating the dressing changes with the [facility] Dressing without complication and overall this has definitely showed signs of improvement according to records as well is what the patient tells me today. I'm very pleased in that regard. She is having no pain today 03/08/18 She is here for follow up evaluation of a left great toe ulcer. She remains non-compliant with glucose control and smoking cessation; glucose levels consistently >200. She states that she got new shoe inserts/peg assist. She admits to compliance with offloading. Since my last evaluation there is significant improvement. We will switch to prisma at this time and she will follow up next week. She is noted to be tachycardic at this appointment, heart rate 120s; she has a history of heart rate 70-130 according to our records. She admits to extreme agitation r/t personal issues; she was advised to monitor her heartrate and contact her physician if it does not return to a more normal range (<100). She takes cardizem twice  daily. 03/15/18-She is here in follow-up evaluation for left great toe ulcer. She remains noncompliant with glucose control and smoking cessation. She admits  to compliance with wearing offloading shoe. The ulcer is improved/stable and we will continue with the same treatment plan and she will follow-up next week 03/22/18-She is here for evaluation for left great toe ulcer. There continues to be significant improvement despite recurrent hyperglycemia (over 500 yesterday) and she continues to smoke. She has been compliant with offloading and we will continue with same treatment plan and she will follow-up next week. 03/29/18-She is here for evaluation for left great toe ulcer. Despite continuing to smoke and uncontrolled diabetes she continues to improve. She is compliant with offloading shoe. We will continue with the same treatment plan and she will follow-up next week 04/05/18- She is here in follow up evaluation for a left great toe ulcer; she presents with small pustule to left fifth toe (resembles ant bite). She admits to compliance with wearing offloading shoe; continues to smoke or have uncontrolled blood glucose control. There is more callus than usual with evidence of bleeding; she denies known trauma. 04/12/18-She is here for evaluation of left great toe ulcer. Despite noncompliance with glycemic control and smoking she continues to make improvement. She continues to wear offloading shoe. The pustule, that was identified last week, to the left fifth toe is resolved. She will follow-up in 2 weeks 05/03/18-she is seen in follow-up evaluation for a left great toe ulcer. She is compliant with offloading, otherwise noncompliant with glycemic control and smoking. She has plateaued and there is minimal improvement noted. We will transition to [facility], replaced the insert to her surgical shoe and she will follow-up in one week 05/10/18- She is here in follow up evaluation for a left great toe  ulcer. It appears stable despite measurement change. We will continue with same treatment plan and follow up next week. 05/24/18-She is seen in follow-up evaluation for a left great toe ulcer. She remains compliant with offloading, has made significant improvement in her diet, decreasing the amount of sugar/soda. She said her recent A1c was 10.9 which is lower than. She did see a diabetic nutritionist/educator yesterday. She continues to smoke. We will continue with the same treatment plan and she'll follow-up next week. 05/31/18- She is seen in follow-up evaluation for left great toe ulcer. She continues to remain compliant with offloading, continues to make improvement in her diet, increasing her water and decreasing the amount of sugar/soda. She does continue to smoke with no desire to quit. We will apply Prisma to the depth and Hydrofera Blue over. We have not received insurance authorization for oasis. She will follow up next week. 06/07/18-She is seen in follow-up evaluation for left great toe ulcer. It has stalled according to today's measurements although base appears stable. She says she saw a diabetic educator yesterday; her average blood sugars are less than 300 which is an improvement for her. She continues to smoke and states "that's my next step" She continues with water over soda. We will order for xray, culture and reinstate ace wrap compression prior to placing apligraf for next week. She is voicing no complaints or concerns. Her dressing will change to iodoflex over the next week in preparation for apligraf. 06/14/18-She is seen in follow-up evaluation for left great toe ulcer. Plain film x-ray performed last week was negative for osteomyelitis. Wound culture obtained last week grew strep B and OSSA; she is initiated on keflex and cefdinir today; there is erythema to the toe which could be from ace wrap compression, she has a history of wrapping too tight and has has  been encouraged to  maintain ace wraps that we place today. We will hold off on application of apligraf today, will apply next week after antibiotic therapy has been initiated. She admits today that she has resumed taking a shower with her foot/toe submerged in water, she has been reminded to keep foot/toe out of the bath water. She will be seen in follow up next week 06/21/18-she is seen in follow-up evaluation for left great toe ulcer. She is tolerating antibiotic therapy with no GI disturbance. The wound is stable. Apligraf was applied today. She has been decreasing her smoking, only had 4 cigarettes yesterday and [name], [name] ([id number]) 1 today. She continues being more compliant in diabetic diet. She will follow-up next week for evaluation of site, if stable will remove at 2 weeks. 06/28/18- She is here in follow up evalution. Apligraf was placed last week, she states the dressing fell off on Tuesday and she was dressing with hydrofera blue. She is healed and will be discharged from the clinic today. She has been instructed to continue with smoking cessation, continue monitoring glucose levels, offloading for an additional 4 weeks and continue with hydrofera blue for additional two weeks for any possible microscopic opening. Readmission: 08/07/18 on evaluation today patient presents for reevaluation concerning the ulcer of her right great toe. She was previously discharged on 06/28/18 healed. Nonetheless she states that this began to show signs of drainage she subsequently went to her primary care provider. Subsequently an x-ray was performed on 08/01/18 which was negative. The patient was also placed on antibiotics at that time. Fortunately they should have been effective for the infection. Nonetheless she's been experiencing some improvement but still has a lot of drainage coming from the wound itself. 08/14/18 on evaluation today patient's wound actually does show signs of improvement in regard to the erythema  at this point. She has completed the antibiotics. With that being said we did discuss the possibility of placing her in a total contact cast as of today although I think that I may want to give this just a little bit more time to ensure nothing recurrence as far as her infection is concerned. I do not want to put in the cast and risk infection at that time if things are not completely resolved. With that being said she is gonna require some debridement today. 08/21/18 on evaluation today patient actually appears to be doing okay in regard to her toe ulcer. She's been tolerating the dressing changes without complication. With that being said it does appear that she is ready and in fact I think it's appropriate for [place] to go ahead and initiate the total contact cast today. Nonetheless she will require some sharp debridement to prepare the wound for application. Overall I feel like things have been progressing well but we do need to do something to get this to close more readily. 08/24/18 patient seen today for reevaluation after having had the total contact cast applied on Tuesday. She seems to have done very well the wound appears to be doing great and overall I'm pleased with the progress that she's made. There were no abnormal areas of rubbing from the cast on her lower extremity. 08/30/18 on evaluation today patient actually appears to be completely healed in regard to her plantar toe ulcer. She tells me at this point she's been having a lot of issues with the cast. She almost fell a couple of times the state shall the step of her dog a couple  times as well. This is been a very frustrating process for her other nonetheless she has completely healed the wound which is excellent news. Overall there does not appear to be the evidence of infection at this time which is great news. 09/11/18 evaluation today patient presents for follow-up concerning her great toe ulcer on the left which has  unfortunately reopened since I last saw her which was only a couple of weeks ago. Unfortunately she was not able to get in to get the shoe and potentially the AFO that's gonna be necessary due to her left foot drop. She continues with offloading shoe but this is not enough to prevent her from reopening it appears. When we last had her in the total contact cast she did well from a healing standpoint but unfortunately the wound reopened as soon as she came out of the cast within just a couple of weeks. 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 This is also part of the reason why she falls on a regular basis. I just do not believe that is gonna be safe for [place] to reinitiate the total contact cast as last time we had this on she fell 3 times one week which is definitely not normal for her. 09/18/18 upon evaluation today the patient actually appears to be doing about the same in regard to her toe ulcer. She did not contact [employer] as I asked her to even though I had given her the prescription. In fact she actually states that she has no idea where the prescription is. She did apparently call [employer] and they told her that all she needed to do was bring the prescription in order to be able to be seen and work on getting the AFO for her left foot. With all that being said she still does not have an appointment and I'm not sure were things stand that regard. I will give her a new prescription today in order to contact them to get this set up. 09/25/18 on evaluation today patient actually appears to be doing about the same in regard to her toes ulcer. She does have a small areas which seems to have a lot of callous buildup around the edge of the wound which is going to need sharp debridement today. She still is  waiting to be scheduled for evaluation with [employer] for possibility of an AFO. She states there supposed to call her tomorrow to get this set up. Unfortunately it does appear that her foot specifically the toe area is showing signs of erythema. There does not appear to be any systemic infection which is in these good news. [name], [name] ([id number]) 10/02/18 on evaluation today patient actually appears to be doing about the same in regard to her toe ulcer. This really has not done too well although it's not significantly larger it's also not significantly smaller. She has been tolerating the dressing changes without complication. She actually has her appointment with [facility] and [place] tomorrow to hopefully be measured for obtaining and AFO splint. I think this would be helpful preventing this from reoccurring. We had contemplated starting the cast this week although to be honest I am reluctant to do that as she's been having nausea, vomiting, and seizure activity over the past three days.  She has a history of seizures and have been told is nothing that can be done for these. With that being said I do believe that along with the seizures have the nausea vomiting which upon further questioning doesn't seem to be the normal for her and makes me concerned for the possibility of infection or something else going on. I discussed this with the patient and her mother during the office visit today. I do not feel the wound is effective but maybe something else. The responses this was "this just happens to her at times and we don't know why". They did not seem to be interested in going to the hospital to have this checked out further. 10/09/18 on evaluation today patient presents for follow-up concerning her ongoing toe ulcer. She has been tolerating the dressing changes without complication. Fortunately there does not appear to be any evidence of infection which is great news however I do think that the  patient would benefit from going ahead for with the total contact cast. She's actually in a wheelchair today she tells me that she will use her walker if we initiate the cast. I was very specific about the fact that if we were gonna do the cast I wanted to make sure that she was using the walker in order to prevent any falls. She tells me she does not have stairs that she has to traverse on a regular basis at her home. She has not had any seizures since last week again that something that happens to her often she tells me she did talk to [employer] and they said that it may take up to three weeks to get the brace approved for her. Hopefully that will not take that long but nonetheless in the meantime I do think the cast could be of benefit. 10/12/18 on evaluation today patient appears to be doing rather well in regard to her toe ulcer. It's just been a few days and already this is significantly improved both as far as overall appearance and size. Fortunately there's no sign of infection. She is here for her first obligatory cast change. 10/19/18 Seen today for follow up and management of left great toe ulcer. Wound continues to show improvement. Noted small open area with seroussang drainage with palpation. Denies any increased pain or recent fevers during visit. She will continue calcium alginate with offloading shoe. Denies any questions or concerns during visit. 10/26/18 on evaluation today patient appears to be doing about the same as when I last saw her in regard to her wound bed. Fortunately there does not appear to be any signs of infection. Unfortunately she continues to have a breakdown in regard to the toe region any time that she is not in the cast. It takes almost no time at all for this to happen. Nonetheless she still has not heard anything from the brace being made by [employer] as to when exactly this will be available to her. Fortunately there is no signs of infection at this time. 10/30/18  on evaluation today patient presents for application of the total contact cast as we just received him this morning. Fortunately we are gonna be able to apply this to her today which is great news. She continues to have no significant pain which is good news. Overall I do feel like things have been improving while she was the cast is when she doesn't have a cast that things get worse. She still has not really heard anything from [employer] regarding  her brace. 11/02/18 upon evaluation today patient's wound already appears to be doing significantly better which is good news. Fortunately there does not appear to be any signs of infection also good news. Overall I do think the total contact cast as before is helping to heal this area unfortunately it's just not gonna likely keep the area closed and healed without her getting her brace at least. Again the foot drop is a significant issue for her. 11/09/18 on evaluation today patient appears to be doing excellent in regard to her toe ulcer which in fact is completely healed. Fortunately we finally got the situation squared away with the paperwork which was needed to proceed with getting her brace approved by Medicaid. I have filled that out unfortunately that information has been sent to the orthopedic office that I worked at 2 1/2 years ago and not tired Current wound care measures. Fortunately she seems to be doing very well at this time. 11/23/18 on evaluation today patient appears to be doing More Poorly Compared to Last Time I Saw Her. At [facility] She Had Completely Healed. Currently she is continuing to have issues with reopening. She states that she just found out that the brace was approved through Medicaid now she just has to go get measured in order to have this fitted for her and then made. Subsequently she does not have an appointment for this yet that is going to complicate things we obviously cannot put her back in the cast if we do not have  everything measured because they're not gonna be able to measure her foot while she is in the cast. 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 Obviously this is unfortunate and does have me somewhat worried as well. 11/30/18 on evaluation today patient's toe ulcer actually appears to be doing fairly well. The good news is she will be getting her Hockenberry, Dejah A. ([id number]) brace in the shoes next week on Wednesday. Hopefully we will be able to get this to heal without having to go back in the cast however she may need the cast in order to get the wound completely heal and then go from there. Fortunately there's no signs of infection at this time. 12/07/18 on evaluation today patient fortunately did receive her brace and she states she could tell this definitely makes her walk better. With that being said she's been having issues with her toe where she noticed yesterday there was a lot of tissue that was loosing off this appears to be much larger than what it was previous. She also states that her leg has been read putting much across the top of her foot just about the ankle although this seems to be receiving somewhat. The total area is still red and appears to be someone infected as best I can tell. She is previously taken Bactrim and that may be a good option for her today as well. We are gonna see what I wound culture shows as well and I think that this is definitely appropriate. With that being said outside of the culture I still need to initiate something in the interim and that's what I'm gonna go ahead and select Bactrim is a good option for her. 12/14/18 on evaluation today patient appears to be doing better in regard to her left great toe ulcer as compared to last week's evaluation. There's still some erythema  although this is significantly improved which is excellent news. Overall I do  believe that she is making good progress is still gonna take some time before she is where I would like her to be from the standpoint of being able to place her back into the total contact cast. Hopefully we will be where we need to be by next week. 12/21/18 on evaluation today patient actually appears to be doing poorly in regard to her toe ulcer. She's been tolerating the dressing changes without complication. Fortunately there's no signs of systemic infection although she does have a lot of drainage from the toe ulcer and this does seem to be causing some issues at this point. She does have erythema on the distal portion of her toe that appears to be likely cellulitis. 12/28/18 on evaluation today patient actually appears to be doing a little better in my pinion in regard to her toe ulcer. With that being said she still does have some evidence of infection at this time and for her culture she had both E. coli as well as enterococcus as organisms noted on evaluation. For that reason I think that though the Keflex likely has treated the E. coli rather well this has really done nothing for the enterococcus. We are going to have to initiate treatment for this specifically. 01/04/19 on evaluation today patient's toe actually appears to be doing better from the standpoint of infection. She currently would like to see about putting the cash back on I think that this is appropriate as long as she takes care of it and keeps it from getting wet. She is gonna have some drainage we can definitely pass this up with Drawtex and alginate to try to prevent as much drainage as possible from causing the problems. With that being said I do want to at least try her with the cast between now and Tuesday. If there any issues we can't continue to use it then I will discontinue the use of the cast at that point. 01/08/19 on evaluation today patient actually appears to be doing very well as far as her foot ulcer specifically  the great toe on the left is concerned. She did have an area of rubbing on the medial aspect of her left ankle which again is from the cast. Fortunately there's no signs of infection at this point in this appears to be a very slight skin breakdown. The patient tells me she felt it rubbing but didn't think it was that bad. Fortunately there is no signs of active infection at this time which is good news. No fevers, chills, nausea, or vomiting noted at this time. 01/15/19 on evaluation today patient actually appears to be doing well in regard to her toe ulcer. Again as previous she seems to do well and she has the cast on which indicates to me that during the time she doesn't have a cast on she's putting way too much pressure on this region. Obviously I think that's gonna be an issue as with the current national emergency concerning the Covid-19 Virus it has been recommended that we discontinue the use of total contact casting by the chief medical officer of our company, Dr. Simona Huh. The reasoning is that if a patient becomes sick and cannot come into have the cast removed they could not just leave this on for an additional two weeks. Obviously the hospitals also do not want to receive patient's who are sick into the emergency department to potentially contaminate the region  and spread the Covid-19 Virus among other sick individuals within the hospital system. Therefore at this point we are suspending the use of total contact cast until the current emergency subsides. This was all discussed with the patient today as well. 01/22/19 on evaluation today patient's wound on her left great toe appears to be doing slightly worse than previously noted last week. She tells me that she has been on this quite a bit in fact she tells me she's been awake for 38 straight hours. This is due to the fact that she's having to care for grandparents because nobody else will. She has been taking care of them for five the  last seven days since I've seen her they both have dementia his is from a stroke and her grandmother's was progressive. Nonetheless she states even her mom who knows her condition and situation has only help two of those days to take care of them she's been taking care of the rest. Fortunately there does not appear to be any signs of active infection in regard to her toe at this point although obviously it doesn't look as good as it did previous. I think this is directly related to her not taking off the pressure and friction by way of taking things easy. Though I completely understand what's going on. SHIRLYN, SAVIN ([id number]) 01/29/19 on evaluation today patient's tools are actually appears to be showing some signs of improvement today compared to last week's evaluation as far as not necessarily the overall size of the wound but the fact that she has some new skin growth in between the two ends of the wound opening. Overall I feel like she has done well she states that she had a family member give her what sounds to be a CAM walker boot which has been helpful as well. 02/05/19 on evaluation today patient's wound bed actually appears to be doing significantly better in regard to her overall appearance of the size of the wound. With that being said she is still having an issue with offloading efficiently enough to get this to close. Apparently there is some signs of infection at this point as well unfortunately. Previously she's done well of Augmentin I really do not see anything that needs to be culture currently but there theme and cellulitis of the foot that I'm seeing I'm gonna go ahead and place her on an antibiotic today to try to help clear this up. 02/12/2019 on evaluation today patient actually appears to be doing poorly in regard to her overall wound status. She tells me she has been using her offloading shoe but actually comes in today wearing her tennis shoe with the AFO brace. Again as  I previously discussed with her this is really not sufficient to allow the area to heal appropriately. Nonetheless she continues to be somewhat noncompliant and I do wonder based on what she has told my nurse in the past as to whether or not a good portion of this noncompliance may be recreational drug and alcohol related. She has had a history of heroin overdose and this was fairly recently in the past couple of months that have been seeing her. Nonetheless overall I feel like her wound looks significantly worse today compared to what it was previous. She still has significant erythema despite the Augmentin I am not sure that this is an appropriate medication for her infection I am also concerned that the infection may have gone down into her bone. 02/19/19 on evaluation today  patient actually appears to be doing about the same in regard to her toe ulcer. Unfortunately she continues to show signs of bone exposure and infection at this point. There does not appear to be any evidence of worsening of the infection but I'm also not really sure that it's getting significantly better. She is on the Augmentin which should be sufficient for the Staphylococcus aureus infection that she has at this point. With that being said she may need IV antibiotics to more appropriately treat this. We did have a discussion today about hyperbaric option therapy. 02/28/19 on evaluation today patient actually appears to be doing much worse in regard to the wound on her left great toe as compared to even my previous evaluation last week. Unfortunately this seems to be training in a pretty poor direction. Her toe was actually now starting to angle laterally and I can actually see the entire joint area of the proximal portion of the digit where is the distal portion of the digit again is no longer even in contact with the joint line. Unfortunately there's a lot more necrotic tissue around the edge and the toe appears to be  showing signs of becoming gangrenous in my pinion. I'm very concerned about were things stand at this point. She did see infectious disease and they are planning to send in a prescription for Sivextro for her and apparently this has been approved. With that being said I don't think she should avoid taking this but at the same time I'm not sure that it's gonna be sufficient to save her toe at this point. 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 03/05/19 on evaluation today patient actually appears to be doing quite well in regard to her toe all things considering. She still has bone exposed but there appears to be much less your thing on overall the appearance of the wound and the toe itself is dramatically improved. She still does have some issues currently obviously with infection she did see vascular as well and there concerned that her blood flow to the toad. For that reason they are setting up for an angiogram next week. Patient History Information obtained from Patient. Social History Current every day smoker. Medical History Ear/Nose/Mouth/Throat Patient has history of Chronic sinus problems/congestion, Middle ear problems Endocrine Patient has history of Type II Diabetes Review of Systems (ROS) Constitutional Symptoms (General Health) Denies complaints or symptoms of Fatigue, Fever, Chills, Marked Weight Change. Respiratory Keelan, [place] ([id number]) Denies complaints or symptoms of Chronic or frequent coughs, Shortness of Breath. Cardiovascular Denies complaints or symptoms of Chest pain, LE edema. Psychiatric Denies complaints or symptoms of Anxiety, Claustrophobia. Objective Constitutional Well-nourished and well-hydrated in no acute distress. Vitals Time Taken: 2:00  PM, Height: 69 in, Weight: 240 lbs, BMI: 35.4, Temperature: 98.2 F, Pulse: 99 bpm, Respiratory Rate: 18 breaths/min, Blood Pressure: 96/63 mmHg. Respiratory normal breathing without difficulty. clear to auscultation bilaterally. Cardiovascular regular rate and rhythm with normal S1, S2. Psychiatric this patient is able to make decisions and demonstrates good insight into disease process. Alert and Oriented x 3. pleasant and cooperative. General Notes: Patient's wound bed currently shows evidence of bone exposure at the joint line at this time. Nonetheless she continues to have issues with infection although it does  appear that the infection is doing much better than previously noted. No fevers, chills, nausea, or vomiting noted at this time. Integumentary (Hair, Skin) Wound #5 status is Open. Original cause of wound was Gradually Appeared. The wound is located on the Left Toe Great. The wound measures 3cm length x 2cm width x 0.6cm depth; 4.712cm^2 area and 2.827cm^3 volume. There is bone, muscle, and Fat Layer (Subcutaneous Tissue) Exposed exposed. Tunneling has been noted at 6:00 with a maximum distance of 0.5cm. Undermining begins at 10:00 and ends at 5:00 with a maximum distance of 0.6cm. There is a medium amount of serous drainage noted. The wound margin is epibole. There is small (1-33%) pink, hyper - granulation within the wound bed. There is a large (67-100%) amount of necrotic tissue within the wound bed including Adherent Slough and Necrosis of Muscle. The periwound skin appearance exhibited: Callus, Maceration, Rubor, Erythema. The periwound skin appearance did not exhibit: Crepitus, Excoriation, Induration, Rash, Scarring, Dry/Scaly, Atrophie Blanche, Cyanosis, Ecchymosis, Hemosiderin Staining, Mottled, Pallor. The surrounding wound skin color is noted with erythema which is circumferential. Periwound temperature was noted as Hot. Assessment Active Problems ICD-10 [name], [name] ([id number]) Type 2 diabetes mellitus with foot ulcer Non-pressure chronic ulcer of other part of left foot with fat layer exposed Foot drop, left foot Other chronic osteomyelitis, left ankle and foot Plan Wound Cleansing: Wound #5 Left Toe Great: Clean wound with Normal Saline. Anesthetic (add to Medication List): Wound #5 Left Toe Great: Topical Lidocaine 4% cream applied to wound bed prior to debridement (In Clinic Only). Primary Wound Dressing: Wound #5 Left Toe Great: Silver Alginate Secondary Dressing: Wound #5 Left Toe Great: Conform/Kerlix Foam Dressing Change Frequency: Wound #5 Left Toe Great: Three times weekly Follow-up Appointments: Wound #5 Left Toe Great: Return Appointment in 1 week. - Friday 03/15/19 Off-Loading: Wound #5 Left Toe Great: Open toe surgical shoe to: - Wear your surgical shoe with your brace. No tennis Shoes! Additional Orders / Instructions: Wound #5 Left Toe Great: Increase protein intake. Medications-please add to medication list.: Wound #5 Left Toe Great: P.O. Antibiotics My suggestion at this point is gonna be that we go ahead and continue with the above wound care measures for the next week and the patient is in agreement with plan. We will subsequently see were things stand at follow-up. If anything changes or worsens in the meantime she will contact the office let me know. Otherwise will see what Dr. Delana Meyer has to say following her study next week. Please see above for specific wound care orders. We will see patient for re-evaluation in 1 week(s) here in the clinic. If anything worsens or changes patient will contact our office for additional recommendations. Electronic Signature(s) Signed: 03/07/2019 3:58:08 AM By: Worthy Keeler PA-C Entered By: Worthy Keeler on 03/07/2019 03:27:49 [name] ([name], Abran Duke  ([id number]) -------------------------------------------------------------------------------- ROS/PFSH Details Patient Name: Heather Dear A. Date of Service: 03/05/2019 1:45 PM Medical Record Number: [id number] Patient Account Number: [id number] Date of Birth/Sex: 03-20-1976 (43 y.o. F) Treating RN: Montey Hora Primary Care Provider: Priscille Kluver Other Clinician: Referring Provider: Priscille Kluver Treating Provider/Extender: STONE III, Izeyah Deike Weeks in Treatment: 30 Information Obtained From Patient Constitutional Symptoms (General Health) Complaints and Symptoms: Negative for: Fatigue; Fever; Chills; Marked Weight Change Respiratory Complaints and Symptoms: Negative for: Chronic or frequent coughs; Shortness of Breath Cardiovascular Complaints and Symptoms: Negative for: Chest pain; LE edema Psychiatric Complaints and Symptoms: Negative for: Anxiety; Claustrophobia Ear/Nose/Mouth/Throat Medical History: Positive  for: Chronic sinus problems/congestion; Middle ear problems Endocrine Medical History: Positive for: Type II Diabetes Time with diabetes: 10 years Treated with: Insulin, Oral agents Blood sugar tested every day: No Blood sugar testing results: Lunch: 350 HBO Extended History Items Ear/Nose/Mouth/Throat: Ear/Nose/Mouth/Throat: Chronic sinus problems/congestion Middle ear problems Immunizations Pneumococcal Vaccine: Received Pneumococcal Vaccination: No Implantable Devices No devices added Kintzel, Abriana A. ([id number]) Family and Social History Current every day smoker Physician Affirmation I have reviewed and agree with the above information. Electronic Signature(s) Signed: 03/07/2019 3:58:08 AM By: Worthy Keeler PA-C Signed: 03/07/2019 1:37:45 PM By: Montey Hora Entered By: Worthy Keeler on 03/07/2019 03:27:20 [name] ([id number]) -------------------------------------------------------------------------------- SuperBill Details Patient Name:  Heather Dear A. Date of Service: 03/05/2019 Medical Record Number: [id number] Patient Account Number: [id number] Date of Birth/Sex: 1975/10/30 (43 y.o. F) Treating RN: Montey Hora Primary Care Provider: Priscille Kluver Other Clinician: Referring Provider: Priscille Kluver Treating Provider/Extender: Melburn Hake, Laylaa Guevarra Weeks in Treatment: 30 Diagnosis Coding ICD-10 Codes Code Description E11.621 Type 2 diabetes mellitus with foot ulcer L97.522 Non-pressure chronic ulcer of other part of left foot with fat layer exposed M21.372 Foot drop, left foot M86.672 Other chronic osteomyelitis, left ankle and foot Facility Procedures CPT4 Code: [id number] Description: 99213 - WOUND CARE VISIT-LEV 3 EST PT Modifier: Quantity: 1 Physician Procedures CPT4 Code: 2426834 Description: 19622 - WC PHYS LEVEL 4 - EST PT ICD-10 Diagnosis Description E11.621 Type 2 diabetes mellitus with foot ulcer L97.522 Non-pressure chronic ulcer of other part of left foot with fa M21.372 Foot drop, left foot M86.672 Other chronic  osteomyelitis, left ankle and foot Modifier: t layer exposed Quantity: 1 Electronic Signature(s) Signed: 03/07/2019 3:58:08 AM By: Worthy Keeler PA-C Entered By: Worthy Keeler on 03/07/2019 03:28:06

## 2019-03-08 ENCOUNTER — Other Ambulatory Visit: Payer: Self-pay

## 2019-03-08 ENCOUNTER — Other Ambulatory Visit
Admission: RE | Admit: 2019-03-08 | Discharge: 2019-03-08 | Disposition: A | Discharge status: Home / Self Care | Payer: Medicaid Other | Source: Ambulatory Visit | Attending: Vascular Surgery | Admitting: Vascular Surgery

## 2019-03-08 DIAGNOSIS — Z1159 Encounter for screening for other viral diseases: Secondary | ICD-10-CM | POA: Diagnosis not present

## 2019-03-11 MED ORDER — CEFAZOLIN SODIUM-DEXTROSE 2-4 GM/100ML-% IV SOLN: 2.0000 g | Freq: Once | INTRAVENOUS | Status: DC

## 2019-03-11 NOTE — Telephone Encounter (Signed)
Spoke with the patient and she will arrive for her procedure tomorrow at 6:45 am.

## 2019-03-12 ENCOUNTER — Other Ambulatory Visit: Payer: Self-pay

## 2019-03-12 ENCOUNTER — Ambulatory Visit
Admission: RE | Admit: 2019-03-12 | Discharge: 2019-03-12 | Disposition: A | Discharge status: Home / Self Care | Payer: Medicaid Other | Attending: Vascular Surgery | Admitting: Vascular Surgery

## 2019-03-12 ENCOUNTER — Encounter
Admission: RE | Disposition: A | Discharge status: Home / Self Care | Payer: Self-pay | Source: Home / Self Care | Attending: Vascular Surgery

## 2019-03-12 DIAGNOSIS — L97529 Non-pressure chronic ulcer of other part of left foot with unspecified severity: Secondary | ICD-10-CM | POA: Diagnosis not present

## 2019-03-12 DIAGNOSIS — I110 Hypertensive heart disease with heart failure: Secondary | ICD-10-CM | POA: Diagnosis not present

## 2019-03-12 DIAGNOSIS — L97909 Non-pressure chronic ulcer of unspecified part of unspecified lower leg with unspecified severity: Secondary | ICD-10-CM

## 2019-03-12 DIAGNOSIS — I70203 Unspecified atherosclerosis of native arteries of extremities, bilateral legs: Secondary | ICD-10-CM | POA: Diagnosis not present

## 2019-03-12 DIAGNOSIS — I5042 Chronic combined systolic (congestive) and diastolic (congestive) heart failure: Secondary | ICD-10-CM | POA: Diagnosis not present

## 2019-03-12 DIAGNOSIS — F17210 Nicotine dependence, cigarettes, uncomplicated: Secondary | ICD-10-CM | POA: Insufficient documentation

## 2019-03-12 DIAGNOSIS — I70299 Other atherosclerosis of native arteries of extremities, unspecified extremity: Secondary | ICD-10-CM

## 2019-03-12 DIAGNOSIS — E11621 Type 2 diabetes mellitus with foot ulcer: Secondary | ICD-10-CM | POA: Insufficient documentation

## 2019-03-12 DIAGNOSIS — I70245 Atherosclerosis of native arteries of left leg with ulceration of other part of foot: Secondary | ICD-10-CM

## 2019-03-12 HISTORY — PX: LOWER EXTREMITY ANGIOGRAPHY: CATH118251

## 2019-03-12 LAB — GLUCOSE, CAPILLARY
Glucose-Capillary: 434 mg/dL — ABNORMAL HIGH (ref 70–99)
Glucose-Capillary: 396 mg/dL — ABNORMAL HIGH (ref 70–99)
Glucose-Capillary: 383 mg/dL — ABNORMAL HIGH (ref 70–99)
Glucose-Capillary: 294 mg/dL — ABNORMAL HIGH (ref 70–99)

## 2019-03-12 LAB — CREATININE, SERUM
Creatinine, Ser: 1.73 mg/dL — ABNORMAL HIGH (ref 0.44–1.00)
GFR calc Af Amer: 41 mL/min — ABNORMAL LOW (ref >60)
GFR calc non Af Amer: 36 mL/min — ABNORMAL LOW (ref >60)

## 2019-03-12 LAB — BUN: BUN: 19 mg/dL (ref 6–20)

## 2019-03-12 SURGERY — LOWER EXTREMITY ANGIOGRAPHY
Anesthesia: Moderate Sedation | Laterality: Left

## 2019-03-12 MED ORDER — OXYCODONE HCL 5 MG PO TABS: 5.0000 mg | ORAL_TABLET | ORAL | Status: DC | PRN

## 2019-03-12 MED ORDER — ONDANSETRON HCL 4 MG/2ML IJ SOLN: 4.0000 mg | Freq: Four times a day (QID) | INTRAMUSCULAR | Status: DC | PRN

## 2019-03-12 MED ORDER — MORPHINE SULFATE (PF) 4 MG/ML IV SOLN: 2.0000 mg | INTRAVENOUS | Status: DC | PRN

## 2019-03-12 MED ORDER — ACETAMINOPHEN 325 MG PO TABS: 650.0000 mg | ORAL_TABLET | ORAL | Status: DC | PRN

## 2019-03-12 MED ORDER — SODIUM CHLORIDE 0.9 % IV SOLN
INTRAVENOUS | Status: DC
  Administered 2019-03-12: 08:00:00 via INTRAVENOUS

## 2019-03-12 MED ORDER — MIDAZOLAM HCL 5 MG/5ML IJ SOLN
INTRAMUSCULAR | Status: AC
  Filled 2019-03-12: qty 5

## 2019-03-12 MED ORDER — CEFAZOLIN SODIUM-DEXTROSE 2-4 GM/100ML-% IV SOLN
INTRAVENOUS | Status: AC
  Administered 2019-03-12: 09:00:00
  Filled 2019-03-12: qty 100

## 2019-03-12 MED ORDER — HYDRALAZINE HCL 20 MG/ML IJ SOLN: 5.0000 mg | INTRAMUSCULAR | Status: DC | PRN

## 2019-03-12 MED ORDER — LABETALOL HCL 5 MG/ML IV SOLN: 10.0000 mg | INTRAVENOUS | Status: DC | PRN

## 2019-03-12 MED ORDER — IOHEXOL 300 MG/ML  SOLN
INTRAMUSCULAR | Status: DC | PRN
  Administered 2019-03-12: 10:00:00 80 mL via INTRA_ARTERIAL

## 2019-03-12 MED ORDER — FENTANYL CITRATE (PF) 100 MCG/2ML IJ SOLN
INTRAMUSCULAR | Status: AC
  Filled 2019-03-12: qty 2

## 2019-03-12 MED ORDER — INSULIN ASPART 100 UNIT/ML ~~LOC~~ SOLN
0.0000 [IU] | Freq: Three times a day (TID) | SUBCUTANEOUS | Status: DC
  Administered 2019-03-12: 08:00:00 20 [IU] via SUBCUTANEOUS

## 2019-03-12 MED ORDER — MIDAZOLAM HCL 2 MG/2ML IJ SOLN
INTRAMUSCULAR | Status: DC | PRN
  Administered 2019-03-12 (×3): 1 mg via INTRAVENOUS

## 2019-03-12 MED ORDER — METHYLPREDNISOLONE SODIUM SUCC 125 MG IJ SOLR: 125.0000 mg | Freq: Once | INTRAMUSCULAR | Status: DC | PRN

## 2019-03-12 MED ORDER — MIDAZOLAM HCL 2 MG/ML PO SYRP
8.0000 mg | ORAL_SOLUTION | Freq: Once | ORAL | Status: AC | PRN
  Administered 2019-03-12: 8 mg via ORAL

## 2019-03-12 MED ORDER — DIPHENHYDRAMINE HCL 50 MG/ML IJ SOLN: 50.0000 mg | Freq: Once | INTRAMUSCULAR | Status: DC | PRN

## 2019-03-12 MED ORDER — MIDAZOLAM HCL 2 MG/ML PO SYRP
ORAL_SOLUTION | ORAL | Status: AC
  Filled 2019-03-12: qty 4

## 2019-03-12 MED ORDER — HYDROMORPHONE HCL 1 MG/ML IJ SOLN: 1.0000 mg | Freq: Once | INTRAMUSCULAR | Status: DC | PRN

## 2019-03-12 MED ORDER — SODIUM CHLORIDE 0.9 % IV SOLN: 250.0000 mL | INTRAVENOUS | Status: DC | PRN

## 2019-03-12 MED ORDER — HEPARIN SODIUM (PORCINE) 1000 UNIT/ML IJ SOLN
INTRAMUSCULAR | Status: AC
  Filled 2019-03-12: qty 1

## 2019-03-12 MED ORDER — FAMOTIDINE 20 MG PO TABS: 40.0000 mg | ORAL_TABLET | Freq: Once | ORAL | Status: DC | PRN

## 2019-03-12 MED ORDER — FENTANYL CITRATE (PF) 100 MCG/2ML IJ SOLN
INTRAMUSCULAR | Status: DC | PRN
  Administered 2019-03-12: 50 ug via INTRAVENOUS
  Administered 2019-03-12: 25 ug via INTRAVENOUS
  Administered 2019-03-12: 50 ug via INTRAVENOUS

## 2019-03-12 MED ORDER — HEPARIN SODIUM (PORCINE) 1000 UNIT/ML IJ SOLN
INTRAMUSCULAR | Status: DC | PRN
  Administered 2019-03-12: 5000 [IU] via INTRAVENOUS

## 2019-03-12 MED ORDER — SODIUM CHLORIDE 0.9 % IV SOLN: INTRAVENOUS | Status: DC

## 2019-03-12 MED ORDER — SODIUM CHLORIDE 0.9% FLUSH: 3.0000 mL | INTRAVENOUS | Status: DC | PRN

## 2019-03-12 MED ORDER — SODIUM CHLORIDE 0.9% FLUSH: 3.0000 mL | Freq: Two times a day (BID) | INTRAVENOUS | Status: DC

## 2019-03-12 MED ORDER — INSULIN ASPART 100 UNIT/ML ~~LOC~~ SOLN
SUBCUTANEOUS | Status: AC
  Filled 2019-03-12: qty 1

## 2019-03-12 SURGICAL SUPPLY — 12 items
CATH PIG 70CM (CATHETERS) ×1 IMPLANT
CATH VERT 5FR 125CM (CATHETERS) ×1 IMPLANT
DEVICE STARCLOSE SE CLOSURE (Vascular Products) ×1 IMPLANT
GLIDEWIRE ADV .035X260CM (WIRE) ×1 IMPLANT
NDL ENTRY 21GA 7CM ECHOTIP (NEEDLE) IMPLANT
NEEDLE ENTRY 21GA 7CM ECHOTIP (NEEDLE) ×2 IMPLANT
PACK ANGIOGRAPHY (CUSTOM PROCEDURE TRAY) ×2 IMPLANT
SET INTRO CAPELLA COAXIAL (SET/KITS/TRAYS/PACK) ×1 IMPLANT
SHEATH BRITE TIP 5FRX11 (SHEATH) ×1 IMPLANT
TUBING CONTRAST HIGH PRESS 72 (TUBING) ×1 IMPLANT
WIRE J 3MM .035X145CM (WIRE) ×1 IMPLANT
WIRE MAGIC TORQUE 260C (WIRE) ×1 IMPLANT

## 2019-03-12 NOTE — H&P (Signed)
[facility] History & Physical Update  The patient was interviewed and re-examined.  The patient's previous History and Physical has been reviewed and is unchanged.  There is no change in the plan of care. We plan to proceed with the scheduled procedure.  [name], MD  03/12/2019, 8:55 AM

## 2019-03-12 NOTE — Discharge Instructions (Signed)
Femoral Site Care °This sheet gives you information about how to care for yourself after your procedure. Your health care provider may also give you more specific instructions. If you have problems or questions, contact your health care provider. °What can I expect after the procedure? °After the procedure, it is common to have: °· Bruising that usually fades within 1-2 weeks. °· Tenderness at the site. °Follow these instructions at home: °Wound care °· Follow instructions from your health care provider about how to take care of your insertion site. Make sure you: °? Wash your hands with soap and water before you change your bandage (dressing). If soap and water are not available, use hand sanitizer. °? Change your dressing as told by your health care provider. °? Leave stitches (sutures), skin glue, or adhesive strips in place. These skin closures may need to stay in place for 2 weeks or longer. If adhesive strip edges start to loosen and curl up, you may trim the loose edges. Do not remove adhesive strips completely unless your health care provider tells you to do that. °· Do not take baths, swim, or use a hot tub until your health care provider approves. °· You may shower 24-48 hours after the procedure or as told by your health care provider. °? Gently wash the site with plain soap and water. °? Pat the area dry with a clean towel. °? Do not rub the site. This may cause bleeding. °· Do not apply powder or lotion to the site. Keep the site clean and dry. °· Check your femoral site every day for signs of infection. Check for: °? Redness, swelling, or pain. °? Fluid or blood. °? Warmth. °? Pus or a bad smell. °Activity °· For the first 2-3 days after your procedure, or as long as directed: °? Avoid climbing stairs as much as possible. °? Do not squat. °· Do not lift anything that is heavier than 10 lb (4.5 kg), or the limit that you are told, until your health care provider says that it is safe. °· Rest as  directed. °? Avoid sitting for a long time without moving. Get up to take short walks every 1-2 hours. °· Do not drive for 24 hours if you were given a medicine to help you relax (sedative). °General instructions °· Take over-the-counter and prescription medicines only as told by your health care provider. °· Keep all follow-up visits as told by your health care provider. This is important. °Contact a health care provider if you have: °· A fever or chills. °· You have redness, swelling, or pain around your insertion site. °Get help right away if: °· The catheter insertion area swells very fast. °· You pass out. °· You suddenly start to sweat or your skin gets clammy. °· The catheter insertion area is bleeding, and the bleeding does not stop when you hold steady pressure on the area. °· The area near or just beyond the catheter insertion site becomes pale, cool, tingly, or numb. °These symptoms may represent a serious problem that is an emergency. Do not wait to see if the symptoms will go away. Get medical help right away. Call your local emergency services (911 in the [place] Do not drive yourself to the hospital. °Summary °· After the procedure, it is common to have bruising that usually fades within 1-2 weeks. °· Check your femoral site every day for signs of infection. °· Do not lift anything that is heavier than 10 lb (4.5 kg), or the   limit that you are told, until your health care provider says that it is safe. °This information is not intended to replace advice given to you by your health care provider. Make sure you discuss any questions you have with your health care provider. °Document Released: 06/13/2014 Document Revised: 10/23/2017 Document Reviewed: 10/23/2017 °Elsevier Interactive Patient Education © 2019 [employer] °Moderate Conscious Sedation, Adult, Care After °These instructions provide you with information about caring for yourself after your procedure. Your health care provider may also give  you more specific instructions. Your treatment has been planned according to current medical practices, but problems sometimes occur. Call your health care provider if you have any problems or questions after your procedure. °What can I expect after the procedure? °After your procedure, it is common: °· To feel sleepy for several hours. °· To feel clumsy and have poor balance for several hours. °· To have poor judgment for several hours. °· To vomit if you eat too soon. °Follow these instructions at home: °For at least 24 hours after the procedure: ° °· Do not: °? Participate in activities where you could fall or become injured. °? Drive. °? Use heavy machinery. °? Drink alcohol. °? Take sleeping pills or medicines that cause drowsiness. °? Make important decisions or sign legal documents. °? Take care of children on your own. °· Rest. °Eating and drinking °· Follow the diet recommended by your health care provider. °· If you vomit: °? Drink water, juice, or soup when you can drink without vomiting. °? Make sure you have little or no nausea before eating solid foods. °General instructions °· Have a responsible adult stay with you until you are awake and alert. °· Take over-the-counter and prescription medicines only as told by your health care provider. °· If you smoke, do not smoke without supervision. °· Keep all follow-up visits as told by your health care provider. This is important. °Contact a health care provider if: °· You keep feeling nauseous or you keep vomiting. °· You feel light-headed. °· You develop a rash. °· You have a fever. °Get help right away if: °· You have trouble breathing. °This information is not intended to replace advice given to you by your health care provider. Make sure you discuss any questions you have with your health care provider. °Document Released: 07/31/2013 Document Revised: 03/14/2016 Document Reviewed: 01/30/2016 °Elsevier Interactive Patient Education © 2019 [employer] °Angiogram, Care After °This sheet gives you information about how to care for yourself after your procedure. Your doctor may also give you more specific instructions. If you have problems or questions, contact your doctor. °Follow these instructions at home: °Insertion site care °· Follow instructions from your doctor about how to take care of your long, thin tube (catheter) insertion area. Make sure you: °? Wash your hands with soap and water before you change your bandage (dressing). If you cannot use soap and water, use hand sanitizer. °? Change your bandage as told by your doctor. °? Leave stitches (sutures), skin glue, or skin tape (adhesive) strips in place. They may need to stay in place for 2 weeks or longer. If tape strips get loose and curl up, you may trim the loose edges. Do not remove tape strips completely unless your doctor says it is okay. °· Do not take baths, swim, or use a hot tub until your doctor says it is okay. °· You may shower 24-48 hours after the procedure or as told by your doctor. °? Gently wash   the area with plain soap and water. °? Pat the area dry with a clean towel. °? Do not rub the area. This may cause bleeding. °· Do not apply powder or lotion to the area. Keep the area clean and dry. °· Check your insertion area every day for signs of infection. Check for: °? More redness, swelling, or pain. °? Fluid or blood. °? Warmth. °? Pus or a bad smell. °Activity °· Rest as told by your doctor, usually for 1-2 days. °· Do not lift anything that is heavier than 10 lbs. (4.5 kg) or as told by your doctor. °· Do not drive for 24 hours if you were given a medicine to help you relax (sedative). °· Do not drive or use heavy machinery while taking prescription pain medicine. °General instructions ° °· Go back to your normal activities as told by your doctor, usually in about a week. Ask your doctor what activities are safe for you. °· If the insertion area starts to bleed, lie flat and put  pressure on the area. If the bleeding does not stop, get help right away. This is an emergency. °· Drink enough fluid to keep your pee (urine) clear or pale yellow. °· Take over-the-counter and prescription medicines only as told by your doctor. °· Keep all follow-up visits as told by your doctor. This is important. °Contact a doctor if: °· You have a fever. °· You have chills. °· You have more redness, swelling, or pain around your insertion area. °· You have fluid or blood coming from your insertion area. °· The insertion area feels warm to the touch. °· You have pus or a bad smell coming from your insertion area. °· You have more bruising around the insertion area. °· Blood collects in the tissue around the insertion area (hematoma) that may be painful to the touch. °Get help right away if: °· You have a lot of pain in the insertion area. °· The insertion area swells very fast. °· The insertion area is bleeding, and the bleeding does not stop after holding steady pressure on the area. °· The area near or just beyond the insertion area becomes pale, cool, tingly, or numb. °These symptoms may be an emergency. Do not wait to see if the symptoms will go away. Get medical help right away. Call your local emergency services (911 in the [place] Do not drive yourself to the hospital. °Summary °· After the procedure, it is common to have bruising and tenderness at the long, thin tube insertion area. °· After the procedure, it is important to rest and drink plenty of fluids. °· Do not take baths, swim, or use a hot tub until your doctor says it is okay to do so. You may shower 24-48 hours after the procedure or as told by your doctor. °· If the insertion area starts to bleed, lie flat and put pressure on the area. If the bleeding does not stop, get help right away. This is an emergency. °This information is not intended to replace advice given to you by your health care provider. Make sure you discuss any questions you have  with your health care provider. °Document Released: 01/06/2009 Document Revised: 10/04/2016 Document Reviewed: 10/04/2016 °Elsevier Interactive Patient Education © 2019 [employer]. ° °

## 2019-03-12 NOTE — Op Note (Signed)
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  Abdominal aortogram  2.  Left lower extremity distal runoff third or order catheter placement with additional third order  3.  Star close right common femoral artery    Anesthesia: Conscious sedation was administered by the interventional radiology RN under my direct supervision. IV Versed plus fentanyl were utilized. Continuous ECG, pulse oximetry and blood pressure was monitored throughout the entire procedure.  Conscious sedation was administered for a total of 50 minutes.  Sheath: 5 French 11 cm Pinnacle right common femoral retrograde  Contrast: 80 cc   Fluoroscopy Time: 8.9 minutes  Indications:  The patient presents to [facility] with ulceration of the left great toe.  Pedal pulses are nonpalpable bilaterally suggesting atherosclerotic occlusive disease.  The risks and benefits as well as alternative therapies for lower extremity revascularization are reviewed with the patient all questions are answered the patient agrees to proceed.  The patient is therefore undergoing angiography with the hope for intervention for limb salvage.   Procedure:  [name] a 43 y.o. female who was identified and appropriate procedural time out was performed.  The patient was then placed supine on the table and prepped and draped in the usual sterile fashion.  Ultrasound was used to evaluate the right common femoral artery.  It was echolucent and pulsatile indicating it is patent .  An ultrasound image was acquired for the permanent record.  A micropuncture needle was used to access the right common femoral artery under direct ultrasound  guidance.  The microwire was then advanced under fluoroscopic guidance without difficulty followed by the micro-sheath.  A 0.035 J wire was advanced without resistance and a 5Fr sheath was placed.    Pigtail catheter was then advanced to the level of T12 and AP projection of the aorta was obtained. Pigtail catheter was then repositioned to above the bifurcation and RAO view of the pelvis was obtained. Stiff angled Glidewire and pigtail catheter was then used across the bifurcation and the catheter was positioned in the distal external iliac artery.  LAO of the left groin was then obtained. Wire was reintroduced and negotiated into the SFA and the catheter was advanced into the SFA. Distal runoff was then performed.  Images from the ankle distally were inadequate and a Glidewire was reintroduced the pigtail catheter was then exchanged for a 125 cm Kumpe catheter.  Initially the anterior tibial artery is selected catheter negotiated down to the mid anterior tibial and distal runoff is obtained.  Catheter is then pulled back into the popliteal and the wire and subsequently catheter negotiated into the posterior tibial artery and again distal runoff is then obtained.  After review of the images the catheter was removed over wire and an RAO view of the groin was obtained. StarClose device was deployed without difficulty.   Findings:   Aortogram: The abdominal aorta is opacified with a bolus injection of contrast.  There are mild atherosclerotic changes throughout the aorta, bilateral common and bilateral external iliac arteries.  However no hemodynamically significant lesions are identified.  Left Lower Extremity: The left common femoral profunda femoris superficial femoral and popliteal arteries are widely patent.  There is an increasing amount of calcification of the walls noted with mild  atherosclerotic irregularities.  But there are no hemodynamically significant stenoses.  Trifurcation is patent.  The  anterior tibial and posterior tibial arteries are patent down to the ankle and free of hemodynamically significant lesions.  The peroneal is patent in its proximal portion but occludes distally.  The anterior tibial artery fills the dorsalis pedis but there is very poor filling of the third fourth and fifth rays from the anterior tibial artery.  It does have direct flow to the great toe.  Posterior tibial artery fills both a patent medial and lateral plantar arteries as well as the pedal arch and is responsible for flow to all 5 of the digits.  Summary: The patient has inline flow to the pedal arch and to the digits with 2 vessel runoff via the posterior tibial and dorsalis pedis.  There should be no flow limitation to her heel wound healing potential.   Disposition: Patient was taken to the recovery room in stable condition having tolerated the procedure well.  Belenda Cruise Schnier 03/12/2019,10:20 AM

## 2019-03-13 ENCOUNTER — Encounter: Payer: Self-pay | Admitting: Vascular Surgery

## 2019-03-14 ENCOUNTER — Other Ambulatory Visit: Payer: Self-pay

## 2019-03-14 ENCOUNTER — Encounter: Payer: Medicaid Other | Admitting: Physician Assistant

## 2019-03-14 DIAGNOSIS — E11621 Type 2 diabetes mellitus with foot ulcer: Secondary | ICD-10-CM | POA: Diagnosis not present

## 2019-03-14 NOTE — Progress Notes (Addendum)
[name], [name] ([id number]) Visit Report for 03/14/2019 Arrival Information Details Patient Name: [name], [name] Date of Service: 03/14/2019 10:30 AM Medical Record Number: [id number] Patient Account Number: [id number] Date of Birth/Sex: May 26, 1976 (43 y.o. F) Treating RN: Cornell Barman Primary Care Lateef Juncaj: Priscille Kluver Other Clinician: Referring Rosan Calbert: Priscille Kluver Treating Kodee Ravert/Extender: Melburn Hake, HOYT Weeks in Treatment: 42 Visit Information History Since Last Visit Added or deleted any medications: Yes Patient Arrived: Wheel Chair Any new allergies or adverse reactions: No Arrival Time: 10:40 Had a fall or experienced change in No activities of daily living that may affect Accompanied By: self risk of falls: Transfer Assistance: None Signs or symptoms of abuse/neglect since last visito No Patient Identification Verified: Yes Hospitalized since last visit: No Secondary Verification Process Completed: Yes Implantable device outside of the clinic excluding No Patient Requires Transmission-Based No cellular tissue based products placed in the center Precautions: since last visit: Patient Has Alerts: No Has Dressing in Place as Prescribed: Yes Pain Present Now: No Electronic Signature(s) Signed: 03/14/2019 12:08:12 PM By: Lorine Bears RCP, RRT, CHT Entered By: Lorine Bears on 03/14/2019 10:41:08 [name] ([id number]) -------------------------------------------------------------------------------- Clinic Level of Care Assessment Details Patient Name: Heather Dear A. Date of Service: 03/14/2019 10:30 AM Medical Record Number: [id number] Patient Account Number: [id number] Date of Birth/Sex: 07-04-1976 (43 y.o. F) Treating RN: Cornell Barman Primary Care Lashawndra Lampkins: Priscille Kluver Other Clinician: Referring Escher Harr: Priscille Kluver Treating Arabell Neria/Extender: Melburn Hake, HOYT Weeks in Treatment: 31 Clinic Level of Care Assessment  Items TOOL 4 Quantity Score []  - Use when only an EandM is performed on FOLLOW-UP visit 0 ASSESSMENTS - Nursing Assessment / Reassessment X - Reassessment of Co-morbidities (includes updates in patient status) 1 10 X- 1 5 Reassessment of Adherence to Treatment Plan ASSESSMENTS - Wound and Skin Assessment / Reassessment X - Simple Wound Assessment / Reassessment - one wound 1 5 []  - 0 Complex Wound Assessment / Reassessment - multiple wounds []  - 0 Dermatologic / Skin Assessment (not related to wound area) ASSESSMENTS - Focused Assessment []  - Circumferential Edema Measurements - multi extremities 0 []  - 0 Nutritional Assessment / Counseling / Intervention []  - 0 Lower Extremity Assessment (monofilament, tuning fork, pulses) []  - 0 Peripheral Arterial Disease Assessment (using hand held doppler) ASSESSMENTS - Ostomy and/or Continence Assessment and Care []  - Incontinence Assessment and Management 0 []  - 0 Ostomy Care Assessment and Management (repouching, etc.) PROCESS - Coordination of Care X - Simple Patient / Family Education for ongoing care 1 15 []  - 0 Complex (extensive) Patient / Family Education for ongoing care X- 1 10 Staff obtains Programmer, systems, Records, Test Results / Process Orders []  - 0 Staff telephones HHA, Nursing Homes / Clarify orders / etc []  - 0 Routine Transfer to another Facility (non-emergent condition) []  - 0 Routine Hospital Admission (non-emergent condition) []  - 0 New Admissions / Biomedical engineer / Ordering NPWT, Apligraf, etc. []  - 0 Emergency Hospital Admission (emergent condition) X- 1 10 Simple Discharge Coordination [name], [name] ([id number]) []  - 0 Complex (extensive) Discharge Coordination PROCESS - Special Needs []  - Pediatric / Minor Patient Management 0 []  - 0 Isolation Patient Management []  - 0 Hearing / Language / Visual special needs []  - 0 Assessment of Community assistance (transportation, D/C planning, etc.) []  -  0 Additional assistance / Altered mentation []  - 0 Support Surface(s) Assessment (bed, cushion, seat, etc.) INTERVENTIONS - Wound Cleansing / Measurement X - Simple Wound Cleansing - one wound 1  5 []  - 0 Complex Wound Cleansing - multiple wounds X- 1 5 Wound Imaging (photographs - any number of wounds) []  - 0 Wound Tracing (instead of photographs) X- 1 5 Simple Wound Measurement - one wound []  - 0 Complex Wound Measurement - multiple wounds INTERVENTIONS - Wound Dressings []  - Small Wound Dressing one or multiple wounds 0 X- 1 15 Medium Wound Dressing one or multiple wounds []  - 0 Large Wound Dressing one or multiple wounds []  - 0 Application of Medications - topical []  - 0 Application of Medications - injection INTERVENTIONS - Miscellaneous []  - External ear exam 0 []  - 0 Specimen Collection (cultures, biopsies, blood, body fluids, etc.) []  - 0 Specimen(s) / Culture(s) sent or taken to Lab for analysis []  - 0 Patient Transfer (multiple staff / Civil Service fast streamer / Similar devices) []  - 0 Simple Staple / Suture removal (25 or less) []  - 0 Complex Staple / Suture removal (26 or more) []  - 0 Hypo / Hyperglycemic Management (close monitor of Blood Glucose) []  - 0 Ankle / Brachial Index (ABI) - do not check if billed separately X- 1 5 Vital Signs [name], [name] ([id number]) Has the patient been seen at the hospital within the last three years: Yes Total Score: 90 Level Of Care: New/Established - Level 3 Electronic Signature(s) Signed: 03/14/2019 4:50:49 PM By: Gretta Cool, BSN, RN, CWS, [name] RN, BSN Entered By: Gretta Cool, BSN, RN, CWS, [name] on 03/14/2019 11:04:54 [name] ([id number]) -------------------------------------------------------------------------------- Lower Extremity Assessment Details Patient Name: Heather Dear A. Date of Service: 03/14/2019 10:30 AM Medical Record Number: [id number] Patient Account Number: [id number] Date of Birth/Sex: [birth year] (43 y.o.  F) Treating RN: Harold Barban Primary Care Jizel Cheeks: Priscille Kluver Other Clinician: Referring Jacey Eckerson: Priscille Kluver Treating Lynx Goodrich/Extender: STONE III, HOYT Weeks in Treatment: 31 Vascular Assessment Pulses: Dorsalis Pedis Palpable: [Right:Yes] Posterior Tibial Palpable: [Right:Yes] Electronic Signature(s) Signed: 03/14/2019 2:09:52 PM By: Harold Barban Entered By: Harold Barban on 03/14/2019 10:51:59 [name], [name] ([id number]) -------------------------------------------------------------------------------- Multi Wound Chart Details Patient Name: Heather Dear A. Date of Service: 03/14/2019 10:30 AM Medical Record Number: [id number] Patient Account Number: [id number] Date of Birth/Sex: 02-25-1976 (43 y.o. F) Treating RN: Cornell Barman Primary Care Tonnette Zwiebel: Priscille Kluver Other Clinician: Referring Brason Berthelot: Priscille Kluver Treating Oliviarose Punch/Extender: STONE III, HOYT Weeks in Treatment: 31 Vital Signs Height(in): 69 Pulse(bpm): 57 Weight(lbs): 240 Blood Pressure(mmHg): 98/66 Body Mass Index(BMI): 35 Temperature(F): 98.6 Respiratory Rate 16 (breaths/min): Photos: [N/A:N/A] Wound Location: Left Toe Great N/A N/A Wounding Event: Gradually Appeared N/A N/A Primary Etiology: Diabetic Wound/Ulcer of the N/A N/A Lower Extremity Comorbid History: Chronic sinus N/A N/A problems/congestion, Middle ear problems, Type II Diabetes Date Acquired: 11/16/2018 N/A N/A Weeks of Treatment: 15 N/A N/A Wound Status: Open N/A N/A Measurements L x W x D 1.3x1.6x1 N/A N/A (cm) Area (cm) : 1.634 N/A N/A Volume (cm) : 1.634 N/A N/A % Reduction in Area: -315.80% N/A N/A % Reduction in Volume: -940.80% N/A N/A Classification: Grade 3 N/A N/A Exudate Amount: Medium N/A N/A Exudate Type: Serous N/A N/A Exudate Color: amber N/A N/A Wound Margin: Epibole N/A N/A Granulation Amount: Small (1-33%) N/A N/A Granulation Quality: Pink, Hyper-granulation N/A N/A Necrotic Amount: Large  (67-100%) N/A N/A Necrotic Tissue: Eschar, Adherent Slough N/A N/A Exposed Structures: Fat Layer (Subcutaneous N/A N/A Tissue) Exposed: Yes Muscle: Yes Bone: Yes [name], [name] ([id number]) Fascia: No Tendon: No Joint: No Epithelialization: None N/A N/A Assessment Notes: Tip of toe eschar cover but not N/A N/A open. Treatment Notes Electronic  Signature(s) Signed: 03/14/2019 4:50:49 PM By: Gretta Cool, BSN, RN, CWS, [name] RN, BSN Entered By: Gretta Cool, BSN, RN, CWS, [name] on 03/14/2019 11:02:47 [name] ([id number]) -------------------------------------------------------------------------------- [place] Details Patient Name: Heather Dear A. Date of Service: 03/14/2019 10:30 AM Medical Record Number: [id number] Patient Account Number: [id number] Date of Birth/Sex: Oct 03, 1976 (43 y.o. F) Treating RN: Cornell Barman Primary Care Alailah Safley: Priscille Kluver Other Clinician: Referring Hassie Mandt: Priscille Kluver Treating Desmin Daleo/Extender: Worthy Keeler Weeks in Treatment: 31 Active Inactive Electronic Signature(s) Signed: 04/10/2019 8:10:22 AM By: Gretta Cool, BSN, RN, CWS, [name] RN, BSN Previous Signature: 03/14/2019 4:50:49 PM Version By: Gretta Cool, BSN, RN, CWS, [name] RN, BSN Entered By: Gretta Cool, BSN, RN, CWS, [name] on 04/10/2019 08:10:21 [name] ([id number]) -------------------------------------------------------------------------------- Pain Assessment Details Patient Name: Heather Dear A. Date of Service: 03/14/2019 10:30 AM Medical Record Number: [id number] Patient Account Number: [id number] Date of Birth/Sex: Dec 09, 1975 (43 y.o. F) Treating RN: Cornell Barman Primary Care Eleesha Purkey: Priscille Kluver Other Clinician: Referring Jearl Soto: Priscille Kluver Treating Graci Hulce/Extender: Melburn Hake, HOYT Weeks in Treatment: 31 Active Problems Location of Pain Severity and Description of Pain Patient Has Paino No Site Locations Pain Management and Medication Current Pain Management: Electronic  Signature(s) Signed: 03/14/2019 12:08:12 PM By: Lorine Bears RCP, RRT, CHT Signed: 03/14/2019 4:50:49 PM By: Gretta Cool, BSN, RN, CWS, [name] RN, BSN Entered By: Lorine Bears on 03/14/2019 10:41:15 [name] ([id number]) -------------------------------------------------------------------------------- Patient/Caregiver Education Details Patient Name: Heather Dear A. Date of Service: 03/14/2019 10:30 AM Medical Record Number: [id number] Patient Account Number: [id number] Date of Birth/Gender: Aug 18, 1976 (43 y.o. F) Treating RN: Cornell Barman Primary Care Physician: Priscille Kluver Other Clinician: Referring Physician: Priscille Kluver Treating Physician/Extender: Sharalyn Ink in Treatment: 31 Education Assessment Education Provided To: Patient Education Topics Provided Wound/Skin Impairment: Handouts: Caring for Your Ulcer Methods: Demonstration, Explain/Verbal Responses: State content correctly Electronic Signature(s) Signed: 03/14/2019 4:50:49 PM By: Gretta Cool, BSN, RN, CWS, [name] RN, BSN Entered By: Gretta Cool, BSN, RN, CWS, [name] on 03/14/2019 11:05:23 [name] ([id number]) -------------------------------------------------------------------------------- Wound Assessment Details Patient Name: Heather Dear A. Date of Service: 03/14/2019 10:30 AM Medical Record Number: [id number] Patient Account Number: [id number] Date of Birth/Sex: July 12, 1976 (43 y.o. F) Treating RN: Harold Barban Primary Care Yousif Edelson: Priscille Kluver Other Clinician: Referring Jaidon Ellery: Priscille Kluver Treating Sufyan Meidinger/Extender: STONE III, HOYT Weeks in Treatment: 31 Wound Status Wound Number: 5 Primary Diabetic Wound/Ulcer of the Lower Extremity Etiology: Wound Location: Left Toe Great Wound Open Wounding Event: Gradually Appeared Status: Date Acquired: 11/16/2018 Comorbid Chronic sinus problems/congestion, Middle ear Weeks Of Treatment: 15 History: problems, Type II  Diabetes Clustered Wound: No Photos Wound Measurements Length: (cm) 1.3 % Reduction Width: (cm) 1.6 % Reduction Depth: (cm) 1 Epithelializ Area: (cm) 1.634 Tunneling: Volume: (cm) 1.634 Undermining in Area: -315.8% in Volume: -940.8% ation: None No : No Wound Description Classification: Grade 3 Foul Odor A Wound Margin: Epibole Slough/Fibr Exudate Amount: Medium Exudate Type: Serous Exudate Color: amber fter Cleansing: No ino Yes Wound Bed Granulation Amount: Small (1-33%) Exposed Structure Granulation Quality: Pink, Hyper-granulation Fascia Exposed: No Necrotic Amount: Large (67-100%) Fat Layer (Subcutaneous Tissue) Exposed: Yes Necrotic Quality: Eschar, Adherent Slough Tendon Exposed: No Muscle Exposed: Yes Necrosis of Muscle: Yes Joint Exposed: No Bone Exposed: Yes Assessment Notes [name], [name] ([id number]) Tip of toe eschar cover but not open. Electronic Signature(s) Signed: 03/14/2019 2:09:52 PM By: Harold Barban Entered By: Harold Barban on 03/14/2019 10:51:32 [name] ([id number]) -------------------------------------------------------------------------------- [place] Details Patient Name: Heather Dear A. Date of Service:  03/14/2019 10:30 AM Medical Record Number: [id number] Patient Account Number: [id number] Date of Birth/Sex: 08-10-1976 (43 y.o. F) Treating RN: Cornell Barman Primary Care Hanley Woerner: Priscille Kluver Other Clinician: Referring Amman Bartel: Priscille Kluver Treating Happy Begeman/Extender: Melburn Hake, HOYT Weeks in Treatment: 31 Vital Signs Time Taken: 10:42 Temperature (F): 98.6 Height (in): 69 Pulse (bpm): 67 Weight (lbs): 240 Respiratory Rate (breaths/min): 16 Body Mass Index (BMI): 35.4 Blood Pressure (mmHg): 98/66 Reference Range: 80 - 120 mg / dl Electronic Signature(s) Signed: 03/14/2019 12:08:12 PM By: Lorine Bears RCP, RRT, CHT Entered By: Lorine Bears on 03/14/2019 10:47:12

## 2019-03-15 ENCOUNTER — Ambulatory Visit: Payer: Medicaid Other | Admitting: Physician Assistant

## 2019-03-21 ENCOUNTER — Inpatient Hospital Stay
Admission: EM | Admit: 2019-03-21 | Discharge: 2019-03-28 | DRG: 853 | Disposition: A | Discharge status: Home Health | Payer: Medicaid Other | Attending: Specialist | Admitting: Specialist

## 2019-03-21 ENCOUNTER — Other Ambulatory Visit: Payer: Self-pay

## 2019-03-21 ENCOUNTER — Encounter: Payer: Self-pay | Admitting: Emergency Medicine

## 2019-03-21 ENCOUNTER — Emergency Department: Payer: Medicaid Other

## 2019-03-21 DIAGNOSIS — L089 Local infection of the skin and subcutaneous tissue, unspecified: Secondary | ICD-10-CM | POA: Diagnosis not present

## 2019-03-21 DIAGNOSIS — I959 Hypotension, unspecified: Secondary | ICD-10-CM | POA: Diagnosis not present

## 2019-03-21 DIAGNOSIS — K5900 Constipation, unspecified: Secondary | ICD-10-CM | POA: Diagnosis present

## 2019-03-21 DIAGNOSIS — E1152 Type 2 diabetes mellitus with diabetic peripheral angiopathy with gangrene: Secondary | ICD-10-CM | POA: Diagnosis present

## 2019-03-21 DIAGNOSIS — K219 Gastro-esophageal reflux disease without esophagitis: Secondary | ICD-10-CM | POA: Diagnosis present

## 2019-03-21 DIAGNOSIS — E785 Hyperlipidemia, unspecified: Secondary | ICD-10-CM | POA: Diagnosis present

## 2019-03-21 DIAGNOSIS — I330 Acute and subacute infective endocarditis: Secondary | ICD-10-CM | POA: Diagnosis present

## 2019-03-21 DIAGNOSIS — Z794 Long term (current) use of insulin: Secondary | ICD-10-CM

## 2019-03-21 DIAGNOSIS — A4101 Sepsis due to Methicillin susceptible Staphylococcus aureus: Secondary | ICD-10-CM | POA: Diagnosis present

## 2019-03-21 DIAGNOSIS — E11649 Type 2 diabetes mellitus with hypoglycemia without coma: Secondary | ICD-10-CM | POA: Diagnosis not present

## 2019-03-21 DIAGNOSIS — E1140 Type 2 diabetes mellitus with diabetic neuropathy, unspecified: Secondary | ICD-10-CM | POA: Diagnosis present

## 2019-03-21 DIAGNOSIS — E11621 Type 2 diabetes mellitus with foot ulcer: Secondary | ICD-10-CM | POA: Diagnosis present

## 2019-03-21 DIAGNOSIS — I5042 Chronic combined systolic (congestive) and diastolic (congestive) heart failure: Secondary | ICD-10-CM | POA: Diagnosis present

## 2019-03-21 DIAGNOSIS — I10 Essential (primary) hypertension: Secondary | ICD-10-CM | POA: Diagnosis present

## 2019-03-21 DIAGNOSIS — A419 Sepsis, unspecified organism: Secondary | ICD-10-CM | POA: Diagnosis present

## 2019-03-21 DIAGNOSIS — E1165 Type 2 diabetes mellitus with hyperglycemia: Secondary | ICD-10-CM | POA: Diagnosis present

## 2019-03-21 DIAGNOSIS — I110 Hypertensive heart disease with heart failure: Secondary | ICD-10-CM | POA: Diagnosis present

## 2019-03-21 DIAGNOSIS — L97524 Non-pressure chronic ulcer of other part of left foot with necrosis of bone: Secondary | ICD-10-CM | POA: Diagnosis present

## 2019-03-21 DIAGNOSIS — B9620 Unspecified Escherichia coli [E. coli] as the cause of diseases classified elsewhere: Secondary | ICD-10-CM | POA: Diagnosis not present

## 2019-03-21 DIAGNOSIS — M86172 Other acute osteomyelitis, left ankle and foot: Secondary | ICD-10-CM | POA: Diagnosis present

## 2019-03-21 DIAGNOSIS — M869 Osteomyelitis, unspecified: Secondary | ICD-10-CM | POA: Diagnosis present

## 2019-03-21 DIAGNOSIS — L97529 Non-pressure chronic ulcer of other part of left foot with unspecified severity: Secondary | ICD-10-CM | POA: Diagnosis not present

## 2019-03-21 DIAGNOSIS — E118 Type 2 diabetes mellitus with unspecified complications: Secondary | ICD-10-CM

## 2019-03-21 DIAGNOSIS — E119 Type 2 diabetes mellitus without complications: Secondary | ICD-10-CM

## 2019-03-21 DIAGNOSIS — I96 Gangrene, not elsewhere classified: Secondary | ICD-10-CM | POA: Diagnosis not present

## 2019-03-21 DIAGNOSIS — F319 Bipolar disorder, unspecified: Secondary | ICD-10-CM | POA: Diagnosis present

## 2019-03-21 DIAGNOSIS — L03032 Cellulitis of left toe: Secondary | ICD-10-CM | POA: Diagnosis present

## 2019-03-21 DIAGNOSIS — Z981 Arthrodesis status: Secondary | ICD-10-CM | POA: Diagnosis not present

## 2019-03-21 DIAGNOSIS — R000 Tachycardia, unspecified: Secondary | ICD-10-CM

## 2019-03-21 DIAGNOSIS — B964 Proteus (mirabilis) (morganii) as the cause of diseases classified elsewhere: Secondary | ICD-10-CM | POA: Diagnosis not present

## 2019-03-21 DIAGNOSIS — I340 Nonrheumatic mitral (valve) insufficiency: Secondary | ICD-10-CM | POA: Diagnosis not present

## 2019-03-21 DIAGNOSIS — Z20828 Contact with and (suspected) exposure to other viral communicable diseases: Secondary | ICD-10-CM | POA: Diagnosis present

## 2019-03-21 DIAGNOSIS — E1100 Type 2 diabetes mellitus with hyperosmolarity without nonketotic hyperglycemic-hyperosmolar coma (NKHHC): Secondary | ICD-10-CM

## 2019-03-21 DIAGNOSIS — I371 Nonrheumatic pulmonary valve insufficiency: Secondary | ICD-10-CM | POA: Diagnosis not present

## 2019-03-21 DIAGNOSIS — I5022 Chronic systolic (congestive) heart failure: Secondary | ICD-10-CM | POA: Diagnosis present

## 2019-03-21 DIAGNOSIS — E1169 Type 2 diabetes mellitus with other specified complication: Secondary | ICD-10-CM | POA: Diagnosis present

## 2019-03-21 DIAGNOSIS — F17210 Nicotine dependence, cigarettes, uncomplicated: Secondary | ICD-10-CM | POA: Diagnosis present

## 2019-03-21 DIAGNOSIS — Z89412 Acquired absence of left great toe: Secondary | ICD-10-CM | POA: Diagnosis not present

## 2019-03-21 DIAGNOSIS — Z8619 Personal history of other infectious and parasitic diseases: Secondary | ICD-10-CM | POA: Diagnosis not present

## 2019-03-21 DIAGNOSIS — R7881 Bacteremia: Secondary | ICD-10-CM

## 2019-03-21 DIAGNOSIS — B9561 Methicillin susceptible Staphylococcus aureus infection as the cause of diseases classified elsewhere: Secondary | ICD-10-CM | POA: Diagnosis not present

## 2019-03-21 DIAGNOSIS — F1490 Cocaine use, unspecified, uncomplicated: Secondary | ICD-10-CM | POA: Diagnosis not present

## 2019-03-21 DIAGNOSIS — E11628 Type 2 diabetes mellitus with other skin complications: Secondary | ICD-10-CM | POA: Diagnosis not present

## 2019-03-21 DIAGNOSIS — Z8614 Personal history of Methicillin resistant Staphylococcus aureus infection: Secondary | ICD-10-CM | POA: Diagnosis not present

## 2019-03-21 LAB — CBC WITH DIFFERENTIAL/PLATELET
Abs Immature Granulocytes: 0.04 10*3/uL (ref 0.00–0.07)
Basophils Absolute: 0 10*3/uL (ref 0.0–0.1)
Basophils Relative: 0 %
Eosinophils Absolute: 0.1 10*3/uL (ref 0.0–0.5)
Eosinophils Relative: 1 %
HCT: 36.8 % (ref 36.0–46.0)
Hemoglobin: 12.6 g/dL (ref 12.0–15.0)
Immature Granulocytes: 0 %
Lymphocytes Relative: 13 %
Lymphs Abs: 1.3 10*3/uL (ref 0.7–4.0)
MCH: 30.4 pg (ref 26.0–34.0)
MCHC: 34.2 g/dL (ref 30.0–36.0)
MCV: 88.7 fL (ref 80.0–100.0)
Monocytes Absolute: 1 10*3/uL (ref 0.1–1.0)
Monocytes Relative: 10 %
Neutro Abs: 7.4 10*3/uL (ref 1.7–7.7)
Neutrophils Relative %: 76 %
Platelets: 312 10*3/uL (ref 150–400)
RBC: 4.15 MIL/uL (ref 3.87–5.11)
RDW: 13 % (ref 11.5–15.5)
WBC: 9.7 10*3/uL (ref 4.0–10.5)
nRBC: 0 % (ref 0.0–0.2)

## 2019-03-21 LAB — TROPONIN I: Troponin I: <0.03 ng/mL (ref <0.03)

## 2019-03-21 LAB — GLUCOSE, CAPILLARY: Glucose-Capillary: 387 mg/dL — ABNORMAL HIGH (ref 70–99)

## 2019-03-21 LAB — SARS CORONAVIRUS 2 BY RT PCR (HOSPITAL ORDER, PERFORMED IN ~~LOC~~ HOSPITAL LAB): SARS Coronavirus 2: NEGATIVE

## 2019-03-21 LAB — BASIC METABOLIC PANEL
Anion gap: 11 (ref 5–15)
BUN: 12 mg/dL (ref 6–20)
CO2: 26 mmol/L (ref 22–32)
Calcium: 8.8 mg/dL — ABNORMAL LOW (ref 8.9–10.3)
Chloride: 91 mmol/L — ABNORMAL LOW (ref 98–111)
Creatinine, Ser: 1.67 mg/dL — ABNORMAL HIGH (ref 0.44–1.00)
GFR calc Af Amer: 43 mL/min — ABNORMAL LOW (ref >60)
GFR calc non Af Amer: 37 mL/min — ABNORMAL LOW (ref >60)
Glucose, Bld: 619 mg/dL (ref 70–99)
Potassium: 3.8 mmol/L (ref 3.5–5.1)
Sodium: 128 mmol/L — ABNORMAL LOW (ref 135–145)

## 2019-03-21 LAB — LACTIC ACID, PLASMA
Lactic Acid, Venous: 4.2 mmol/L (ref 0.5–1.9)
Lactic Acid, Venous: 2.6 mmol/L (ref 0.5–1.9)

## 2019-03-21 MED ORDER — ACETAMINOPHEN 325 MG PO TABS
650.0000 mg | ORAL_TABLET | Freq: Four times a day (QID) | ORAL | Status: DC | PRN
  Administered 2019-03-21 – 2019-03-25 (×4): 650 mg via ORAL
  Filled 2019-03-21 (×6): qty 2

## 2019-03-21 MED ORDER — AMITRIPTYLINE HCL 25 MG PO TABS
150.0000 mg | ORAL_TABLET | Freq: Every day | ORAL | Status: DC
  Administered 2019-03-21 – 2019-03-27 (×7): 150 mg via ORAL
  Filled 2019-03-21 (×8): qty 6

## 2019-03-21 MED ORDER — METOPROLOL TARTRATE 50 MG PO TABS
50.0000 mg | ORAL_TABLET | Freq: Two times a day (BID) | ORAL | Status: DC
  Administered 2019-03-21 – 2019-03-27 (×5): 50 mg via ORAL
  Filled 2019-03-21 (×11): qty 1

## 2019-03-21 MED ORDER — INSULIN GLARGINE 100 UNIT/ML ~~LOC~~ SOLN
40.0000 [IU] | Freq: Two times a day (BID) | SUBCUTANEOUS | Status: DC
  Administered 2019-03-21: 40 [IU] via SUBCUTANEOUS
  Filled 2019-03-21 (×3): qty 0.4

## 2019-03-21 MED ORDER — PANTOPRAZOLE SODIUM 20 MG PO TBEC
20.0000 mg | DELAYED_RELEASE_TABLET | Freq: Every day | ORAL | Status: DC
  Administered 2019-03-24 – 2019-03-28 (×4): 20 mg via ORAL
  Filled 2019-03-21 (×7): qty 1

## 2019-03-21 MED ORDER — HYDROXYZINE HCL 50 MG PO TABS
50.0000 mg | ORAL_TABLET | Freq: Two times a day (BID) | ORAL | Status: DC
  Administered 2019-03-21 – 2019-03-28 (×11): 50 mg via ORAL
  Filled 2019-03-21 (×15): qty 1

## 2019-03-21 MED ORDER — SODIUM CHLORIDE 0.9 % IV BOLUS
1000.0000 mL | Freq: Once | INTRAVENOUS | Status: AC
  Administered 2019-03-21: 1000 mL via INTRAVENOUS

## 2019-03-21 MED ORDER — SODIUM CHLORIDE 0.9 % IV SOLN
INTRAVENOUS | Status: AC
  Administered 2019-03-21: 23:00:00 via INTRAVENOUS

## 2019-03-21 MED ORDER — VANCOMYCIN HCL IN DEXTROSE 1-5 GM/200ML-% IV SOLN
1000.0000 mg | INTRAVENOUS | Status: DC
  Filled 2019-03-21: qty 200

## 2019-03-21 MED ORDER — GABAPENTIN 600 MG PO TABS
600.0000 mg | ORAL_TABLET | Freq: Three times a day (TID) | ORAL | Status: DC
  Administered 2019-03-21: 600 mg via ORAL
  Filled 2019-03-21: qty 1

## 2019-03-21 MED ORDER — VANCOMYCIN HCL 1.5 G IV SOLR
1500.0000 mg | Freq: Once | INTRAVENOUS | Status: AC
  Administered 2019-03-21: 1500 mg via INTRAVENOUS
  Filled 2019-03-21: qty 1500

## 2019-03-21 MED ORDER — ACETAMINOPHEN 650 MG RE SUPP: 650.0000 mg | Freq: Four times a day (QID) | RECTAL | Status: DC | PRN

## 2019-03-21 MED ORDER — ATORVASTATIN CALCIUM 20 MG PO TABS
20.0000 mg | ORAL_TABLET | Freq: Every day | ORAL | Status: DC
  Administered 2019-03-21 – 2019-03-27 (×7): 20 mg via ORAL
  Filled 2019-03-21 (×6): qty 1

## 2019-03-21 MED ORDER — QUETIAPINE FUMARATE 300 MG PO TABS
800.0000 mg | ORAL_TABLET | Freq: Every day | ORAL | Status: DC
  Administered 2019-03-21 – 2019-03-27 (×7): 800 mg via ORAL
  Filled 2019-03-21 (×8): qty 1

## 2019-03-21 MED ORDER — ONDANSETRON HCL 4 MG PO TABS
4.0000 mg | ORAL_TABLET | Freq: Four times a day (QID) | ORAL | Status: DC | PRN
  Administered 2019-03-25: 4 mg via ORAL
  Filled 2019-03-21: qty 1

## 2019-03-21 MED ORDER — HEPARIN SODIUM (PORCINE) 5000 UNIT/ML IJ SOLN
5000.0000 [IU] | Freq: Three times a day (TID) | INTRAMUSCULAR | Status: DC
  Administered 2019-03-21 – 2019-03-22 (×2): 5000 [IU] via SUBCUTANEOUS
  Filled 2019-03-21 (×2): qty 1

## 2019-03-21 MED ORDER — ONDANSETRON HCL 4 MG/2ML IJ SOLN
4.0000 mg | Freq: Four times a day (QID) | INTRAMUSCULAR | Status: DC | PRN
  Filled 2019-03-21: qty 2

## 2019-03-21 MED ORDER — VANCOMYCIN HCL IN DEXTROSE 1-5 GM/200ML-% IV SOLN: 1000.0000 mg | Freq: Once | INTRAVENOUS | Status: DC

## 2019-03-21 MED ORDER — VENLAFAXINE HCL ER 150 MG PO CP24
300.0000 mg | ORAL_CAPSULE | Freq: Every day | ORAL | Status: DC
  Administered 2019-03-24 – 2019-03-28 (×4): 300 mg via ORAL
  Filled 2019-03-21: qty 4
  Filled 2019-03-21 (×4): qty 2

## 2019-03-21 MED ORDER — FUROSEMIDE 20 MG PO TABS
20.0000 mg | ORAL_TABLET | Freq: Two times a day (BID) | ORAL | Status: DC
  Administered 2019-03-21 – 2019-03-27 (×9): 20 mg via ORAL
  Filled 2019-03-21 (×10): qty 1

## 2019-03-21 MED ORDER — INSULIN ASPART 100 UNIT/ML ~~LOC~~ SOLN
0.0000 [IU] | Freq: Three times a day (TID) | SUBCUTANEOUS | Status: DC
  Administered 2019-03-22: 08:00:00 8 [IU] via SUBCUTANEOUS
  Filled 2019-03-21: qty 1

## 2019-03-21 MED ORDER — SODIUM CHLORIDE 0.9 % IV SOLN
2.0000 g | Freq: Two times a day (BID) | INTRAVENOUS | Status: DC
  Administered 2019-03-21: 2 g via INTRAVENOUS
  Filled 2019-03-21 (×3): qty 2

## 2019-03-21 MED FILL — SIVEXTRO 200 MG TABLET: 200 | 12 days supply | Qty: 12 | Fill #0

## 2019-03-21 NOTE — H&P (Signed)
[facility] NAME: [name]    MR#:  [id number]  DATE OF BIRTH:  Aug 25, 1976  DATE OF ADMISSION:  03/21/2019  PRIMARY CARE PHYSICIAN: Lacie Draft, NP   REQUESTING/REFERRING PHYSICIAN: Archie Balboa, MD  CHIEF COMPLAINT:   Chief Complaint  Patient presents with  . Claudication  . Shortness of Breath    HISTORY OF PRESENT ILLNESS:  [name]  is a 43 y.o. female who presents with chief complaint as above.  Patient presents to the ED with worsening of her left great toe wound.  She states she has been seen in the wound center for this for the significant amount of time now, but it is not healing.  Plan was for amputation apparently, but she has not been able to have this done yet due to scheduling issues.  She has extending cellulitis from this wound now for the past 2 days.  Hospitalist were called for admission  PAST MEDICAL HISTORY:   Past Medical History:  Diagnosis Date  . Chronic combined systolic and diastolic CHF (congestive heart failure) ([place])    a. ECHO 04/23/14 LV EF: 45% - 50%,  mild LVH, diffuse hypokinesis. LV diastolic dysfunction, high ventricular filling pressure. Mild LA dilation.  . Cocaine abuse ([place])   . Diabetes mellitus without complication ([place])   . Hypertension   . Seizures ([place])      PAST SURGICAL HISTORY:   Past Surgical History:  Procedure Laterality Date  . CHOLECYSTECTOMY  2005  . LOWER EXTREMITY ANGIOGRAPHY Left 03/12/2019   Procedure: LOWER EXTREMITY ANGIOGRAPHY;  Surgeon: Katha Cabal, MD;  Location: [facility];  Service: Cardiovascular;  Laterality: Left;  . TUBAL LIGATION       SOCIAL HISTORY:   Social History   Tobacco Use  . Smoking status: Current Every Day Smoker    Packs/day: 1.00    Years: 28.00    Pack years: 28.00  . Smokeless tobacco: Never Used  Substance Use Topics  . Alcohol use: Not Currently    Alcohol/week: 0.0 standard drinks     Comment: Quit drinking 8 months ago (09/01/15)     FAMILY HISTORY:   Family History  Problem Relation Age of Onset  . Hypertension Other   . Drug abuse Mother   . Seizures Neg Hx      DRUG ALLERGIES:   Allergies  Allergen Reactions  . Albumin (Human)     unknown  . Erythromycin Rash  . Midecamycin Rash    MEDICATIONS AT HOME:   Prior to Admission medications   Medication Sig Start Date End Date Taking? Authorizing Provider  ACCU-CHEK AVIVA PLUS test strip USE TO CHECK BLOOD SUGAR QD 08/22/18   [provider]  amitriptyline (ELAVIL) 150 MG tablet Take 150 mg by mouth at bedtime.    [provider]  atorvastatin (LIPITOR) 20 MG tablet Take 20 mg by mouth at bedtime.     [provider]  furosemide (LASIX) 20 MG tablet Take 20 mg by mouth 2 (two) times a day.    [provider]  gabapentin (NEURONTIN) 600 MG tablet Take 1 tablet (600 mg total) by mouth 3 (three) times daily. 09/28/18   Landis Martins, DPM  hydrOXYzine (ATARAX/VISTARIL) 50 MG tablet Take 50 mg by mouth 2 (two) times a day.  08/21/18   [provider]  insulin aspart (NOVOLOG) 100 UNIT/ML injection Inject 4-14 Units into the skin 3 (three) times daily  as needed for high blood sugar.    [provider]  insulin glargine (LANTUS) 100 UNIT/ML injection Inject 40 Units into the skin 2 (two) times a day.    [provider]  Insulin Syringes, Disposable, U-100 0.3 ML MISC Inject 24 Units into the skin at bedtime. 05/14/14   Love, Ivan Anchors, PA-C  meloxicam (MOBIC) 7.5 MG tablet Take 7.5 mg by mouth daily as needed for pain.  08/21/18   [provider]  metFORMIN (GLUCOPHAGE) 1000 MG tablet Take 1,000 mg by mouth 2 (two) times daily with a meal.    [provider]  metoprolol tartrate (LOPRESSOR) 50 MG tablet Take 50 mg by mouth 2 (two) times daily.    [provider]  pantoprazole (PROTONIX) 20 MG tablet Take 20 mg by mouth daily.   08/21/18   [provider]  prazosin (MINIPRESS) 5 MG capsule Take 5 mg by mouth at bedtime.    [provider]  promethazine (PHENERGAN) 12.5 MG tablet Take 12.5 mg by mouth 2 (two) times daily as needed for nausea or vomiting.  08/21/18   [provider]  QUEtiapine (SEROQUEL) 400 MG tablet Take 800 mg by mouth at bedtime.    [provider]  SIVEXTRO 200 MG TABS Take 1 tablet by mouth daily. Patient taking differently: Take 1 tablet by mouth at bedtime.  02/28/19   Powers, Evern Core, MD  venlafaxine XR (EFFEXOR-XR) 150 MG 24 hr capsule Take 300 mg by mouth daily.  09/14/18   [provider]    REVIEW OF SYSTEMS:  Review of Systems  Constitutional: Negative for chills, fever, malaise/fatigue and weight loss.  HENT: Negative for ear pain, hearing loss and tinnitus.   Eyes: Negative for blurred vision, double vision, pain and redness.  Respiratory: Negative for cough, hemoptysis and shortness of breath.   Cardiovascular: Negative for chest pain, palpitations, orthopnea and leg swelling.  Gastrointestinal: Negative for abdominal pain, constipation, diarrhea, nausea and vomiting.  Genitourinary: Negative for dysuria, frequency and hematuria.  Musculoskeletal: Negative for back pain, joint pain and neck pain.  Skin:       Left great toe wound with extending erythema  Neurological: Negative for dizziness, tremors, focal weakness and weakness.  Endo/Heme/Allergies: Negative for polydipsia. Does not bruise/bleed easily.  Psychiatric/Behavioral: Negative for depression. The patient is not nervous/anxious and does not have insomnia.      VITAL SIGNS:   Vitals:   03/21/19 1714 03/21/19 1716 03/21/19 1827  BP:  124/78 (!) 127/103  Pulse:  (!) 126 (!) 114  Resp:  20 18  Temp:  99 F (37.2 C)   TempSrc:  Oral   SpO2:  97% 100%  Weight: 95.3 kg    Height: 5\' 9"  (1.753 m)     Wt Readings from Last 3 Encounters:  03/21/19 95.3 kg  03/12/19 95.3 kg   03/04/19 95.3 kg    PHYSICAL EXAMINATION:  Physical Exam  Vitals reviewed. Constitutional: She is oriented to person, place, and time. She appears well-developed and well-nourished. No distress.  HENT:  Head: Normocephalic and atraumatic.  Mouth/Throat: Oropharynx is clear and moist.  Eyes: Pupils are equal, round, and reactive to light. Conjunctivae and EOM are normal. No scleral icterus.  Neck: Normal range of motion. Neck supple. No JVD present. No thyromegaly present.  Cardiovascular: Normal rate, regular rhythm and intact distal pulses. Exam reveals no gallop and no friction rub.  No murmur heard. Respiratory: Effort normal and breath sounds normal. No  respiratory distress. She has no wheezes. She has no rales.  GI: Soft. Bowel sounds are normal. She exhibits no distension. There is no abdominal tenderness.  Musculoskeletal: Normal range of motion.        General: No edema.     Comments: No arthritis, no gout  Lymphadenopathy:    She has no cervical adenopathy.  Neurological: She is alert and oriented to person, place, and time. No cranial nerve deficit.  No dysarthria, no aphasia  Skin: Skin is warm and dry. No rash noted. There is erythema (Of the left foot extending from left great toe wound which also has some drainage).  Psychiatric: She has a normal mood and affect. Her behavior is normal. Judgment and thought content normal.    LABORATORY PANEL:   CBC Recent Labs  Lab 03/21/19 1826  WBC 9.7  HGB 12.6  HCT 36.8  PLT 312   ------------------------------------------------------------------------------------------------------------------  Chemistries  Recent Labs  Lab 03/21/19 1826  NA 128*  K 3.8  CL 91*  CO2 26  GLUCOSE 619*  BUN 12  CREATININE 1.67*  CALCIUM 8.8*   ------------------------------------------------------------------------------------------------------------------  Cardiac Enzymes Recent Labs  Lab 03/21/19 1826  TROPONINI <0.03    ------------------------------------------------------------------------------------------------------------------  RADIOLOGY:  Ct Head Wo Contrast  Result Date: 03/21/2019 CLINICAL DATA:  Left-sided weakness, dizziness EXAM: CT HEAD WITHOUT CONTRAST TECHNIQUE: Contiguous axial images were obtained from the base of the skull through the vertex without intravenous contrast. COMPARISON:  None. FINDINGS: Brain: No evidence of acute infarction, hemorrhage, hydrocephalus, extra-axial collection or mass lesion/mass effect. Vascular: No hyperdense vessel or unexpected calcification. Skull: No osseous abnormality. Sinuses/Orbits: Visualized paranasal sinuses are clear. Visualized mastoid sinuses are clear. Visualized orbits demonstrate no focal abnormality. Other: None IMPRESSION: No acute intracranial pathology. Electronically Signed   By: Kathreen Devoid   On: 03/21/2019 18:41   Dg Chest Portable 1 View  Result Date: 03/21/2019 CLINICAL DATA:  Shortness of breath for 2-3 weeks. EXAM: PORTABLE CHEST 1 VIEW COMPARISON:  11/15/2010 FINDINGS: The cardiac silhouette, mediastinal and hilar contours are within normal limits and stable. The lungs are clear. No pleural effusions. The bony thorax is intact. IMPRESSION: No acute cardiopulmonary findings. Electronically Signed   By: Marijo Sanes M.D.   On: 03/21/2019 18:49   Dg Foot Complete Left  Result Date: 03/21/2019 CLINICAL DATA:  Chronic open wound on the left great toe. Pain and swelling. Subsequent encounter. No known injury. EXAM: LEFT FOOT - COMPLETE 3+ VIEW COMPARISON:  Plain films left foot 02/12/2019. FINDINGS: Large open wound along the medial margin of the great toe has worsened. Extensive bony destructive change is now seen throughout the distal phalanx and approximately the distal 2 cm of the proximal phalanx. The distal phalanx is dorsally dislocated. Soft tissues are markedly swollen throughout the foot and worse than on the prior study.  Postoperative change of fixation of ankle fractures noted. IMPRESSION: Markedly worsened appearance of the great toe with findings consistent with osteomyelitis throughout the distal phalanx and at least the distal 2 cm of the proximal phalanx. The distal phalanx is dorsally dislocated. Soft tissue wound along the medial aspect of the great toe has markedly worsened since the prior examination. Diffuse soft tissue swelling about the foot compatible with dependent change and/or cellulitis is much worse than on the prior study. Electronically Signed   By: Inge Rise M.D.   On: 03/21/2019 18:48    EKG:   Orders placed or performed during the hospital encounter of 04/20/14  .  EKG 12-Lead  . EKG 12-Lead  . EKG    IMPRESSION AND PLAN:  Principal Problem:   Sepsis ([place]) -IV antibiotics given, lactic acid is elevated, IV fluids are being administered and we will check lactic acid until within normal limits, blood pressure has been stable, sepsis is due to left toe wound and cellulitis and osteomyelitis Active Problems:   Toe osteomyelitis, left (HCC) -IV antibiotics as above, podiatry consult   DM type 2 (diabetes mellitus, type 2)- poorly controlled with current significant hyperglycemia.  Will administer IV fluids as above, as well as long-acting and sliding scale insulin coverage   HTN (hypertension) -continue home meds   Chronic combined systolic and diastolic CHF (congestive heart failure) ([place]) -continue home medications   Bipolar disorder, unspecified ([place]) -continue home meds  Chart review performed and case discussed with ED provider. Labs, imaging and/or ECG reviewed by provider and discussed with patient/family. Management plans discussed with the patient and/or family.  COVID-19 status: Tested negative     DVT PROPHYLAXIS: SubQ heparin  GI PROPHYLAXIS:  PPI   ADMISSION STATUS: Inpatient     CODE STATUS: Full Code Status History    Date Active Date Inactive Code Status  Order ID Comments User Context   03/12/2019 1026 03/12/2019 1623 Full Code [id number]  Katha Cabal, MD Inpatient   04/28/2014 1601 05/14/2014 1403 Full Code [id number]  Flora Lipps Inpatient   04/20/2014 1544 04/28/2014 1601 Full Code [id number]  Donita Brooks, NP ED      TOTAL TIME TAKING CARE OF THIS PATIENT: 45 minutes.   This patient was evaluated in the context of the global COVID-19 pandemic, which necessitated consideration that the patient might be at risk for infection with the SARS-CoV-2 virus that causes COVID-19. Institutional protocols and algorithms that pertain to the evaluation of patients at risk for COVID-19 are in a state of rapid change based on information released by regulatory bodies including the CDC and federal and state organizations. These policies and algorithms were followed to the best of this provider's knowledge to date during the patient's care at this facility.  Ethlyn Daniels 03/21/2019, 9:39 PM  [facility]  Office  [phone]  CC: Primary care physician; Lacie Draft, NP  Note:  This document was prepared using Dragon voice recognition software and may include unintentional dictation errors.

## 2019-03-21 NOTE — ED Notes (Signed)
Pt walked to toilet in hallway with a steady gait. Pt given cup of water when she returned to room.

## 2019-03-21 NOTE — ED Provider Notes (Signed)
[facility] Emergency Department Provider Note  ____________________________________________   I have reviewed the triage vital signs and the nursing notes.   HISTORY  Chief Complaint Left foot wound  History limited by: Not Limited   HPI [name] is a 43 y.o. female who presents to the emergency department today because of patient's primary concern for left foot wound. She states that she has had a wound on her left foot for a long time and is following with wound care. Is being scheduled for an amputation of her left great toe. States that starting yesterday she has noticed some redness to her distal foot. Says that it has been red in the past but never this bad. The patient has been on antibiotics in the past. She also complains of two roughly 5 minute episodes of left sided weakness that occurred yesterday. States that she felt her arm and her face were weak. Also complaining of shortness of breath and a fast heart rate.   Records reviewed. Per medical record review patient has a history of DM, HTN, seizures.   Past Medical History:  Diagnosis Date  . Chronic combined systolic and diastolic CHF (congestive heart failure) ([place])    a. ECHO 04/23/14 LV EF: 45% - 50%,  mild LVH, diffuse hypokinesis. LV diastolic dysfunction, high ventricular filling pressure. Mild LA dilation.  . Cocaine abuse ([place])   . Diabetes mellitus without complication ([place])   . Hypertension   . Seizures [facility])     Patient Active Problem List   Diagnosis Date Noted  . Atherosclerosis of native arteries of the extremities with ulceration ([place]) 03/04/2019  . Ulcer of great toe, left, with fat layer exposed ([place]) 09/28/2018  . Menometrorrhagia 06/29/2018  . 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 Secondary localized osteoarthrosis of ankle and foot 01/10/2018  . Chronic foot pain, left 02/20/2017  . Leg edema, left 02/20/2017  . 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  Urinary retention with incomplete bladder emptying 05/02/2014  . 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 Cocaine abuse ([place]) 04/23/2014  . Bipolar disorder, unspecified ([place]) 04/23/2014  . Morbid obesity ([place]) 04/23/2014  . DM type 2 (diabetes mellitus, type 2)- poorly controlled 04/23/2014  . Toxic encephalopathy 04/20/2014  . 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 Lactic acid acidosis 04/20/2014  . 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 HCAP (healthcare-associated pneumonia) 04/20/2014  . Post-traumatic stress disorder, unspecified 03/20/2014  . Severe cannabis use disorder ([place]) 03/20/2014    Past Surgical History:  Procedure Laterality Date  . CHOLECYSTECTOMY  2005  . LOWER EXTREMITY ANGIOGRAPHY Left 03/12/2019   Procedure: LOWER EXTREMITY ANGIOGRAPHY;  Surgeon: Katha Cabal, MD;  Location: [facility];  Service: Cardiovascular;  Laterality: Left;  . TUBAL LIGATION      Prior to Admission medications   Medication Sig Start Date End Date Taking? Authorizing Provider  ACCU-CHEK AVIVA PLUS test strip USE TO CHECK BLOOD SUGAR QD 08/22/18   [provider]  amitriptyline (ELAVIL) 150 MG tablet Take 150 mg by mouth at bedtime.    [provider]  atorvastatin (LIPITOR) 20 MG tablet Take 20 mg by mouth at bedtime.     [provider]  furosemide (LASIX) 20 MG tablet Take 20 mg by mouth 2 (two) times a day.    [provider]  gabapentin (NEURONTIN) 600 MG tablet Take 1 tablet (600 mg total) by mouth 3 (three) times daily. 09/28/18  Landis Martins, DPM  hydrOXYzine (ATARAX/VISTARIL) 50 MG tablet Take 50 mg by mouth 2 (two) times a day.  08/21/18   [provider]  insulin aspart (NOVOLOG) 100 UNIT/ML injection Inject 4-14 Units into the skin 3 (three) times daily as needed for high blood sugar.    [provider]  insulin glargine (LANTUS) 100 UNIT/ML injection Inject 40 Units into  the skin 2 (two) times a day.    [provider]  Insulin Syringes, Disposable, U-100 0.3 ML MISC Inject 24 Units into the skin at bedtime. 05/14/14   Love, Ivan Anchors, PA-C  meloxicam (MOBIC) 7.5 MG tablet Take 7.5 mg by mouth daily as needed for pain.  08/21/18   [provider]  metFORMIN (GLUCOPHAGE) 1000 MG tablet Take 1,000 mg by mouth 2 (two) times daily with a meal.    [provider]  metoprolol tartrate (LOPRESSOR) 50 MG tablet Take 50 mg by mouth 2 (two) times daily.    [provider]  pantoprazole (PROTONIX) 20 MG tablet Take 20 mg by mouth daily.  08/21/18   [provider]  prazosin (MINIPRESS) 5 MG capsule Take 5 mg by mouth at bedtime.    [provider]  promethazine (PHENERGAN) 12.5 MG tablet Take 12.5 mg by mouth 2 (two) times daily as needed for nausea or vomiting.  08/21/18   [provider]  QUEtiapine (SEROQUEL) 400 MG tablet Take 800 mg by mouth at bedtime.    [provider]  SIVEXTRO 200 MG TABS Take 1 tablet by mouth daily. Patient taking differently: Take 1 tablet by mouth at bedtime.  02/28/19   Powers, Evern Core, MD  venlafaxine XR (EFFEXOR-XR) 150 MG 24 hr capsule Take 300 mg by mouth daily.  09/14/18   [provider]    Allergies Albumin (human); Erythromycin; and Midecamycin  Family History  Problem Relation Age of Onset  . Hypertension Other   . Drug abuse Mother   . Seizures Neg Hx     Social History Social History   Tobacco Use  . Smoking status: Current Every Day Smoker    Packs/day: 1.00    Years: 28.00    Pack years: 28.00  . Smokeless tobacco: Never Used  Substance Use Topics  . Alcohol use: Not Currently    Alcohol/week: 0.0 standard drinks    Comment: Quit drinking 8 months ago (09/01/15)  . Drug use: Not Currently    Types: Cocaine    Review of Systems Constitutional: No fever/chills Eyes: No visual changes. ENT: No sore throat. Cardiovascular: Positive  for fast heart rate.  Respiratory: Positive for shortness of breath.  Gastrointestinal: No abdominal pain.  No nausea, no vomiting.  No diarrhea.   Genitourinary: Negative for dysuria. Musculoskeletal: Positive for left foot swelling and wound Skin: Negative for rash. Neurological: Positive for left sided weakness.  ____________________________________________   PHYSICAL EXAM:  VITAL SIGNS: ED Triage Vitals  Enc Vitals Group     BP 03/21/19 1716 124/78     Pulse Rate 03/21/19 1716 (!) 126     Resp 03/21/19 1716 20     Temp 03/21/19 1716 99 F (37.2 C)     Temp Source 03/21/19 1716 Oral     SpO2 03/21/19 1716 97 %     Weight 03/21/19 1714 210 lb (95.3 kg)     Height 03/21/19 1714 5\' 9"  (1.753 m)     Head Circumference --      Peak Flow --  Pain Score 03/21/19 1711 0   Constitutional: Alert and oriented.  Eyes: Conjunctivae are normal.  ENT      Head: Normocephalic and atraumatic.      Nose: No congestion/rhinnorhea.      Mouth/Throat: Mucous membranes are moist.      Neck: No stridor. Hematological/Lymphatic/Immunilogical: No cervical lymphadenopathy. Cardiovascular: tachycardic, regular rhythm.  No murmurs, rubs, or gallops.  Respiratory: Normal respiratory effort without tachypnea nor retractions. Breath sounds are clear and equal bilaterally. No wheezes/rales/rhonchi. Gastrointestinal: Soft and non tender. No rebound. No guarding.  Genitourinary: Deferred Musculoskeletal: Left great toe with significant necrosis. Left foot with swelling and erythema throughout the distal foot.  Neurologic:  Normal speech and language. No gross focal neurologic deficits are appreciated.  Skin:  Skin is warm, dry and intact. No rash noted. Psychiatric: Mood and affect are normal. Speech and behavior are normal. Patient exhibits appropriate insight and judgment.  ____________________________________________    LABS (pertinent positives/negatives)  BMP na 128, glu 619, anion gap  11 Lactic acid 4.2 CBC wbc 9.7, hgb 12.6, plt 312   ____________________________________________   EKG  None  ____________________________________________    RADIOLOGY  CXR No acute abnormality  Left foot Worsening of osteomyelitis, worsening of soft tissue swelling  CT head No acute abnormality ____________________________________________   PROCEDURES  Procedures  CRITICAL CARE Performed by: Nance Pear   Total critical care time: 40 minutes  Critical care time was exclusive of separately billable procedures and treating other patients.  Critical care was necessary to treat or prevent imminent or life-threatening deterioration.  Critical care was time spent personally by me on the following activities: development of treatment plan with patient and/or surrogate as well as nursing, discussions with consultants, evaluation of patient's response to treatment, examination of patient, obtaining history from patient or surrogate, ordering and performing treatments and interventions, ordering and review of laboratory studies, ordering and review of radiographic studies, pulse oximetry and re-evaluation of patient's condition.  ____________________________________________   INITIAL IMPRESSION / ASSESSMENT AND PLAN / ED COURSE  Pertinent labs & imaging results that were available during my care of the patient were reviewed by me and considered in my medical decision making (see chart for details).   Patient presented to the emergency department with multiple complaints.  Notably her left foot was concerning for significant necrosis and erythema and swelling.  X-ray was performed which does show worsening osteomyelitis of that foot.  Patient was tachycardic with an elevated lactic acid level concerning for sepsis likely from this foot.  Patient was started on vancomycin.  Additionally she had complaints of some left-sided weakness.  Head CT was negative at this time.   She is also complaining of fast heart rate.  I do wonder if this is secondary to the sepsis.  Troponin was negative.  I discussed findings with patient.  Will plan on admission.  ____________________________________________   FINAL CLINICAL IMPRESSION(S) / ED DIAGNOSES  Final diagnoses:  Sepsis, due to unspecified organism, unspecified whether acute organ dysfunction present [facility])  Left foot infection  Sinus tachycardia     Note: This dictation was prepared with Dragon dictation. Any transcriptional errors that result from this process are unintentional     Nance Pear, MD 03/22/19 [phone]

## 2019-03-21 NOTE — ED Triage Notes (Addendum)
Pt here for toe problems. Pt reports needed it cut off because it is about to fall off but the surgeon is out of town so was told to come here and have it looked at.  Left great toe necrosis and open wound possibly to bone.  Redness to foot.  Also c/o new SHOB for few days.  Denies cough. SHOB mostly with exertion and seems to improve with rest.  Would also like to be seen for neuro symptoms that she had yesterday.  She reports her left arm went limp and had left facial droop as well as difficulty speaking.  Sx resolved within 10 minutes.  No symptoms currently. Appears anxious as well

## 2019-03-21 NOTE — Consult Note (Signed)
Pharmacy Antibiotic Note  [name] is a 43 y.o. female admitted on 03/21/2019 with Osteomyelitis .  Pharmacy has been consulted for Cefepime and Vancomycin dosing.  Plan: Vancomycin 1500 mg IV x 1 dose. Start Vancomycin 1000 mg IV Q 24 hrs. Goal AUC 400-550. Expected AUC: 498 SCr used: 1.67  Start Cefepime 2g IV every 12 hours    Height: 5\' 9"  (175.3 cm) Weight: 210 lb (95.3 kg) IBW/kg (Calculated) : 66.2  Temp (24hrs), Avg:98.8 F (37.1 C), Min:98.5 F (36.9 C), Max:99 F (37.2 C)  Recent Labs  Lab 03/21/19 1826 03/21/19 2016  WBC 9.7  --   CREATININE 1.67*  --   LATICACIDVEN 4.2* 2.6*    Estimated Creatinine Clearance: 53.3 mL/min (A) (by C-G formula based on SCr of 1.67 mg/dL (H)).    Allergies  Allergen Reactions  . Albumin (Human)     unknown  . Erythromycin Rash  . Midecamycin Rash    Antimicrobials this admission: 5/28 vancomycin >>  5/28 cefepime >>   Microbiology results: 5/28 BCx: pending  5/28 SARS Coronavirus: negative   Thank you for allowing pharmacy to be a part of this patient's care.  Pernell Dupre, PharmD, BCPS Clinical Pharmacist 03/21/2019 10:35 PM

## 2019-03-21 NOTE — ED Notes (Signed)
ED TO INPATIENT HANDOFF REPORT  ED Nurse Name and Phone #: vanessa [phone]  S Name/Age/Gender [name] 43 y.o. female Room/Bed: ED30A/ED30A  Code Status   Code Status: Prior  Home/SNF/Other Home Patient oriented to: self, place, time and situation Is this baseline? Yes   Triage Complete: Triage complete  Chief Complaint Foot pain/increased heart rate  Triage Note Pt here for toe problems. Pt reports needed it cut off because it is about to fall off but the surgeon is out of town so was told to come here and have it looked at.  Left great toe necrosis and open wound possibly to bone.  Redness to foot.  Also c/o new SHOB for few days.  Denies cough. SHOB mostly with exertion and seems to improve with rest.  Would also like to be seen for neuro symptoms that she had yesterday.  She reports her left arm went limp and had left facial droop as well as difficulty speaking.  Sx resolved within 10 minutes.  No symptoms currently. Appears anxious as well   Allergies Allergies  Allergen Reactions  . Albumin (Human)     unknown  . Erythromycin Rash  . Midecamycin Rash    Level of Care/Admitting Diagnosis ED Disposition    ED Disposition Condition [facility]: [place] [[id number]]  Level of Care: Med-Surg [16]  Covid Evaluation: Confirmed COVID Negative  Diagnosis: Sepsis [facility]) [[id number]]  Admitting Physician: Lance Coon [[id number]]  Attending Physician: Lance Coon [phone]  Estimated length of stay: past midnight tomorrow  Certification:: I certify this patient will need inpatient services for at least 2 midnights  PT Class (Do Not Modify): Inpatient [101]  PT Acc Code (Do Not Modify): Private [1]       B Medical/Surgery History Past Medical History:  Diagnosis Date  . Chronic combined systolic and diastolic CHF (congestive heart failure) ([place])    a. ECHO 04/23/14 LV EF: 45% - 50%,  mild LVH, diffuse hypokinesis. LV diastolic  dysfunction, high ventricular filling pressure. Mild LA dilation.  . Cocaine abuse ([place])   . Diabetes mellitus without complication ([place])   . Hypertension   . Seizures ([place])    Past Surgical History:  Procedure Laterality Date  . CHOLECYSTECTOMY  2005  . LOWER EXTREMITY ANGIOGRAPHY Left 03/12/2019   Procedure: LOWER EXTREMITY ANGIOGRAPHY;  Surgeon: Katha Cabal, MD;  Location: [place] [facility];  Service: Cardiovascular;  Laterality: Left;  . TUBAL LIGATION       A IV Location/Drains/Wounds Patient Lines/Drains/Airways Status   Active Line/Drains/Airways    Name:   Placement date:   Placement time:   Site:   Days:   Peripheral IV 03/21/19 Right Antecubital   03/21/19    1828    Antecubital   less than 1   Peripheral IV 03/21/19 Right Hand   03/21/19    2009    Hand   less than 1   Sheath 03/12/19 Right Arterial;Femoral   03/12/19    4696    Arterial;Femoral   9          Intake/Output Last 24 hours No intake or output data in the 24 hours ending 03/21/19 2150  Labs/Imaging Results for orders placed or performed during the hospital encounter of 03/21/19 (from the past 48 hour(s))  CBC with Differential     Status: None   Collection Time: 03/21/19  6:26 PM  Result Value Ref Range   WBC 9.7 4.0 -  10.5 K/uL   RBC 4.15 3.87 - 5.11 MIL/uL   Hemoglobin 12.6 12.0 - 15.0 g/dL   HCT 36.8 36.0 - 46.0 %   MCV 88.7 80.0 - 100.0 fL   MCH 30.4 26.0 - 34.0 pg   MCHC 34.2 30.0 - 36.0 g/dL   RDW 13.0 11.5 - 15.5 %   Platelets 312 150 - 400 K/uL   nRBC 0.0 0.0 - 0.2 %   Neutrophils Relative % 76 %   Neutro Abs 7.4 1.7 - 7.7 K/uL   Lymphocytes Relative 13 %   Lymphs Abs 1.3 0.7 - 4.0 K/uL   Monocytes Relative 10 %   Monocytes Absolute 1.0 0.1 - 1.0 K/uL   Eosinophils Relative 1 %   Eosinophils Absolute 0.1 0.0 - 0.5 K/uL   Basophils Relative 0 %   Basophils Absolute 0.0 0.0 - 0.1 K/uL   Immature Granulocytes 0 %   Abs Immature Granulocytes 0.04 0.00 - 0.07 K/uL    Comment:  Performed at [facility], [address] [place], [place] [address]  Basic metabolic panel     Status: Abnormal   Collection Time: 03/21/19  6:26 PM  Result Value Ref Range   Sodium 128 (L) 135 - 145 mmol/L   Potassium 3.8 3.5 - 5.1 mmol/L   Chloride 91 (L) 98 - 111 mmol/L   CO2 26 22 - 32 mmol/L   Glucose, Bld 619 (HH) 70 - 99 mg/dL    Comment: CRITICAL RESULT CALLED TO, READ BACK BY AND VERIFIED WITH RAQUEL DAVID AT 1913 03/21/2019.  TFK    BUN 12 6 - 20 mg/dL   Creatinine, Ser 1.67 (H) 0.44 - 1.00 mg/dL   Calcium 8.8 (L) 8.9 - 10.3 mg/dL   GFR calc non Af Amer 37 (L) >60 mL/min   GFR calc Af Amer 43 (L) >60 mL/min   Anion gap 11 5 - 15    Comment: Performed at [facility], [address] [place], [place] [address]  Troponin I - ONCE - STAT     Status: None   Collection Time: 03/21/19  6:26 PM  Result Value Ref Range   Troponin I <0.03 <0.03 ng/mL    Comment: Performed at [facility], [address] [place], [place] [address]  Lactic acid, plasma     Status: Abnormal   Collection Time: 03/21/19  6:26 PM  Result Value Ref Range   Lactic Acid, Venous 4.2 (HH) 0.5 - 1.9 mmol/L    Comment: CRITICAL RESULT CALLED TO, READ BACK BY AND VERIFIED WITH RAQUEL DAVID AT 1913 03/21/2019.  TFK Performed at [facility], [address] [place], [place] [address]   Lactic acid, plasma     Status: Abnormal   Collection Time: 03/21/19  8:16 PM  Result Value Ref Range   Lactic Acid, Venous 2.6 (HH) 0.5 - 1.9 mmol/L    Comment: CRITICAL RESULT CALLED TO, READ BACK BY AND VERIFIED WITH  VANESSA ASHLEY 2108 03/21/2019 SDR Performed at [facility], [address] [place],  [address]   SARS Coronavirus 2 [facility] order, Performed in [place] hospital lab)     Status: None   Collection Time: 03/21/19  8:16 PM  Result Value Ref Range   SARS Coronavirus 2 NEGATIVE NEGATIVE    Comment: (NOTE) If result is NEGATIVE SARS-CoV-2  target nucleic acids are NOT DETECTED. The SARS-CoV-2 RNA is generally detectable in upper and lower  respiratory specimens during the acute phase  of infection. The lowest  concentration of SARS-CoV-2 viral copies this assay can detect is 250  copies / mL. A negative result does not preclude SARS-CoV-2 infection  and should not be used as the sole basis for treatment or other  patient management decisions.  A negative result may occur with  improper specimen collection / handling, submission of specimen other  than nasopharyngeal swab, presence of viral mutation(s) within the  areas targeted by this assay, and inadequate number of viral copies  (<250 copies / mL). A negative result must be combined with clinical  observations, patient history, and epidemiological information. If result is POSITIVE SARS-CoV-2 target nucleic acids are DETECTED. The SARS-CoV-2 RNA is generally detectable in upper and lower  respiratory specimens dur ing the acute phase of infection.  Positive  results are indicative of active infection with SARS-CoV-2.  Clinical  correlation with patient history and other diagnostic information is  necessary to determine patient infection status.  Positive results do  not rule out bacterial infection or co-infection with other viruses. If result is PRESUMPTIVE POSTIVE SARS-CoV-2 nucleic acids MAY BE PRESENT.   A presumptive positive result was obtained on the submitted specimen  and confirmed on repeat testing.  While 2019 novel coronavirus  (SARS-CoV-2) nucleic acids may be present in the submitted sample  additional confirmatory testing may be necessary for epidemiological  and / or clinical management purposes  to differentiate between  SARS-CoV-2 and other Sarbecovirus currently known to infect humans.  If clinically indicated additional testing with an alternate test  methodology [phone]) is advised. The SARS-CoV-2 RNA is generally  detectable in upper and lower  respiratory sp ecimens during the acute  phase of infection. The expected result is Negative. Fact Sheet for Patients:  StrictlyIdeas.no Fact Sheet for Healthcare Providers: BankingDealers.co.za This test is not yet approved or cleared by the [place] FDA and has been authorized for detection and/or diagnosis of SARS-CoV-2 by FDA under an Emergency Use Authorization (EUA).  This EUA will remain in effect (meaning this test can be used) for the duration of the COVID-19 declaration under Section 564(b)(1) of the Act, 21 U.S.C. section 360bbb-3(b)(1), unless the authorization is terminated or revoked sooner. Performed at [facility], [place], [place] [address]    Ct Head Wo Contrast  Result Date: 03/21/2019 CLINICAL DATA:  Left-sided weakness, dizziness EXAM: CT HEAD WITHOUT CONTRAST TECHNIQUE: Contiguous axial images were obtained from the base of the skull through the vertex without intravenous contrast. COMPARISON:  None. FINDINGS: Brain: No evidence of acute infarction, hemorrhage, hydrocephalus, extra-axial collection or mass lesion/mass effect. Vascular: No hyperdense vessel or unexpected calcification. Skull: No osseous abnormality. Sinuses/Orbits: Visualized paranasal sinuses are clear. Visualized mastoid sinuses are clear. Visualized orbits demonstrate no focal abnormality. Other: None IMPRESSION: No acute intracranial pathology. Electronically Signed   By: Kathreen Devoid   On: 03/21/2019 18:41   Dg Chest Portable 1 View  Result Date: 03/21/2019 CLINICAL DATA:  Shortness of breath for 2-3 weeks. EXAM: PORTABLE CHEST 1 VIEW COMPARISON:  11/15/2010 FINDINGS: The cardiac silhouette, mediastinal and hilar contours are within normal limits and stable. The lungs are clear. No pleural effusions. The bony thorax is intact. IMPRESSION: No acute cardiopulmonary findings. Electronically Signed   By: Marijo Sanes M.D.    On: 03/21/2019 18:49   Dg Foot Complete Left  Result Date: 03/21/2019 CLINICAL DATA:  Chronic open wound on the left great toe. Pain and swelling. Subsequent encounter. No known injury. EXAM:  LEFT FOOT - COMPLETE 3+ VIEW COMPARISON:  Plain films left foot 02/12/2019. FINDINGS: Large open wound along the medial margin of the great toe has worsened. Extensive bony destructive change is now seen throughout the distal phalanx and approximately the distal 2 cm of the proximal phalanx. The distal phalanx is dorsally dislocated. Soft tissues are markedly swollen throughout the foot and worse than on the prior study. Postoperative change of fixation of ankle fractures noted. IMPRESSION: Markedly worsened appearance of the great toe with findings consistent with osteomyelitis throughout the distal phalanx and at least the distal 2 cm of the proximal phalanx. The distal phalanx is dorsally dislocated. Soft tissue wound along the medial aspect of the great toe has markedly worsened since the prior examination. Diffuse soft tissue swelling about the foot compatible with dependent change and/or cellulitis is much worse than on the prior study. Electronically Signed   By: Inge Rise M.D.   On: 03/21/2019 18:48    Pending Labs Unresulted Labs (From admission, onward)    Start     Ordered   03/21/19 1951  Blood culture (routine x 2)  BLOOD CULTURE X 2,   STAT     03/21/19 1951   Signed and Held  HIV antibody (Routine Testing)  Once,   R     Signed and Held   Signed and Held  CBC  (heparin)  Once,   R    Comments:  Baseline for heparin therapy IF NOT ALREADY DRAWN.  Notify MD if PLT < 100 K.    Signed and Held   Signed and Held  Creatinine, serum  (heparin)  Once,   R    Comments:  Baseline for heparin therapy IF NOT ALREADY DRAWN.    Signed and Held   Signed and Held  Basic metabolic panel  Tomorrow morning,   R     Signed and Held   Signed and Held  CBC  Tomorrow morning,   R     Signed and Held           Vitals/Pain Today's Vitals   03/21/19 1714 03/21/19 1716 03/21/19 1731 03/21/19 1827  BP:  124/78  (!) 127/103  Pulse:  (!) 126  (!) 114  Resp:  20  18  Temp:  99 F (37.2 C)    TempSrc:  Oral    SpO2:  97%  100%  Weight: 95.3 kg     Height: 5\' 9"  (1.753 m)     PainSc:   7      Isolation Precautions No active isolations  Medications Medications  vancomycin (VANCOCIN) 1,500 mg in sodium chloride 0.9 % 500 mL IVPB (1,500 mg Intravenous New Bag/Given 03/21/19 2026)  ceFEPIme (MAXIPIME) 2 g in sodium chloride 0.9 % 100 mL IVPB (has no administration in time range)  sodium chloride 0.9 % bolus 1,000 mL (1,000 mLs Intravenous New Bag/Given 03/21/19 2016)  sodium chloride 0.9 % bolus 1,000 mL (1,000 mLs Intravenous New Bag/Given 03/21/19 2015)    Mobility walks Moderate fall risk   Focused Assessments integumentary    R Recommendations: See Admitting Provider Note  Report given to:   Additional Notes:

## 2019-03-21 NOTE — ED Notes (Signed)
Pt sitting in bed speaking with this RN, NAD, A&Ox4. Pt has a large sore on the left great toe that she states has been like this x 2 years. Redness noted on the top of the left foot as well. Pt reports SOB x 2-3 weeks but no cough.

## 2019-03-22 ENCOUNTER — Ambulatory Visit: Payer: Medicaid Other | Admitting: Physician Assistant

## 2019-03-22 ENCOUNTER — Inpatient Hospital Stay: Payer: Medicaid Other

## 2019-03-22 DIAGNOSIS — E11621 Type 2 diabetes mellitus with foot ulcer: Secondary | ICD-10-CM

## 2019-03-22 DIAGNOSIS — B9561 Methicillin susceptible Staphylococcus aureus infection as the cause of diseases classified elsewhere: Secondary | ICD-10-CM

## 2019-03-22 DIAGNOSIS — Z981 Arthrodesis status: Secondary | ICD-10-CM

## 2019-03-22 DIAGNOSIS — Z8619 Personal history of other infectious and parasitic diseases: Secondary | ICD-10-CM

## 2019-03-22 DIAGNOSIS — Z8614 Personal history of Methicillin resistant Staphylococcus aureus infection: Secondary | ICD-10-CM

## 2019-03-22 DIAGNOSIS — F17210 Nicotine dependence, cigarettes, uncomplicated: Secondary | ICD-10-CM

## 2019-03-22 DIAGNOSIS — L089 Local infection of the skin and subcutaneous tissue, unspecified: Secondary | ICD-10-CM

## 2019-03-22 DIAGNOSIS — I96 Gangrene, not elsewhere classified: Secondary | ICD-10-CM

## 2019-03-22 DIAGNOSIS — Z881 Allergy status to other antibiotic agents status: Secondary | ICD-10-CM

## 2019-03-22 DIAGNOSIS — L97529 Non-pressure chronic ulcer of other part of left foot with unspecified severity: Secondary | ICD-10-CM

## 2019-03-22 DIAGNOSIS — Z888 Allergy status to other drugs, medicaments and biological substances status: Secondary | ICD-10-CM

## 2019-03-22 DIAGNOSIS — E1152 Type 2 diabetes mellitus with diabetic peripheral angiopathy with gangrene: Secondary | ICD-10-CM

## 2019-03-22 DIAGNOSIS — R7881 Bacteremia: Secondary | ICD-10-CM

## 2019-03-22 DIAGNOSIS — E11628 Type 2 diabetes mellitus with other skin complications: Secondary | ICD-10-CM

## 2019-03-22 LAB — BASIC METABOLIC PANEL
Anion gap: 9 (ref 5–15)
BUN: 10 mg/dL (ref 6–20)
CO2: 23 mmol/L (ref 22–32)
Calcium: 8.2 mg/dL — ABNORMAL LOW (ref 8.9–10.3)
Chloride: 102 mmol/L (ref 98–111)
Creatinine, Ser: 1.15 mg/dL — ABNORMAL HIGH (ref 0.44–1.00)
GFR calc Af Amer: >60 mL/min (ref >60)
GFR calc non Af Amer: 58 mL/min — ABNORMAL LOW (ref >60)
Glucose, Bld: 303 mg/dL — ABNORMAL HIGH (ref 70–99)
Potassium: 3.6 mmol/L (ref 3.5–5.1)
Sodium: 134 mmol/L — ABNORMAL LOW (ref 135–145)

## 2019-03-22 LAB — URINE DRUG SCREEN, QUALITATIVE (ARMC ONLY)
Amphetamines, Ur Screen: NOT DETECTED
Barbiturates, Ur Screen: NOT DETECTED
Benzodiazepine, Ur Scrn: NOT DETECTED
Cannabinoid 50 Ng, Ur ~~LOC~~: NOT DETECTED
Cocaine Metabolite,Ur ~~LOC~~: NOT DETECTED
MDMA (Ecstasy)Ur Screen: NOT DETECTED
Methadone Scn, Ur: NOT DETECTED
Opiate, Ur Screen: NOT DETECTED
Phencyclidine (PCP) Ur S: NOT DETECTED
Tricyclic, Ur Screen: POSITIVE — AB

## 2019-03-22 LAB — BLOOD CULTURE ID PANEL (REFLEXED)

## 2019-03-22 LAB — CBC
HCT: 32.1 % — ABNORMAL LOW (ref 36.0–46.0)
Hemoglobin: 10.7 g/dL — ABNORMAL LOW (ref 12.0–15.0)
MCH: 30 pg (ref 26.0–34.0)
MCHC: 33.3 g/dL (ref 30.0–36.0)
MCV: 89.9 fL (ref 80.0–100.0)
Platelets: 270 10*3/uL (ref 150–400)
RBC: 3.57 MIL/uL — ABNORMAL LOW (ref 3.87–5.11)
RDW: 13.2 % (ref 11.5–15.5)
WBC: 5.9 10*3/uL (ref 4.0–10.5)
nRBC: 0 % (ref 0.0–0.2)

## 2019-03-22 LAB — PREGNANCY, URINE: Preg Test, Ur: NEGATIVE

## 2019-03-22 LAB — GLUCOSE, CAPILLARY
Glucose-Capillary: 277 mg/dL — ABNORMAL HIGH (ref 70–99)
Glucose-Capillary: 78 mg/dL (ref 70–99)
Glucose-Capillary: 89 mg/dL (ref 70–99)
Glucose-Capillary: 160 mg/dL — ABNORMAL HIGH (ref 70–99)
Glucose-Capillary: 191 mg/dL — ABNORMAL HIGH (ref 70–99)

## 2019-03-22 LAB — HEMOGLOBIN A1C
Hgb A1c MFr Bld: 10 % — ABNORMAL HIGH (ref 4.8–5.6)
Mean Plasma Glucose: 240.3 mg/dL

## 2019-03-22 LAB — MRSA PCR SCREENING: MRSA by PCR: NEGATIVE

## 2019-03-22 MED ORDER — ENOXAPARIN SODIUM 40 MG/0.4ML ~~LOC~~ SOLN
40.0000 mg | SUBCUTANEOUS | Status: DC
  Administered 2019-03-22 – 2019-03-28 (×7): 40 mg via SUBCUTANEOUS
  Filled 2019-03-22 (×7): qty 0.4

## 2019-03-22 MED ORDER — ENSURE MAX PROTEIN PO LIQD
11.0000 [oz_av] | Freq: Two times a day (BID) | ORAL | Status: DC
  Administered 2019-03-23 – 2019-03-25 (×2): 11 [oz_av] via ORAL
  Administered 2019-03-26: 237 mL via ORAL
  Administered 2019-03-27: 11 [oz_av] via ORAL
  Filled 2019-03-22: qty 330

## 2019-03-22 MED ORDER — INSULIN ASPART 100 UNIT/ML ~~LOC~~ SOLN
0.0000 [IU] | Freq: Three times a day (TID) | SUBCUTANEOUS | Status: DC
  Administered 2019-03-22: 4 [IU] via SUBCUTANEOUS
  Administered 2019-03-23 – 2019-03-24 (×3): 11 [IU] via SUBCUTANEOUS
  Administered 2019-03-26: 4 [IU] via SUBCUTANEOUS
  Administered 2019-03-27: 3 [IU] via SUBCUTANEOUS
  Administered 2019-03-28: 7 [IU] via SUBCUTANEOUS
  Filled 2019-03-22 (×7): qty 1

## 2019-03-22 MED ORDER — SODIUM CHLORIDE 0.9 % IV SOLN
INTRAVENOUS | Status: DC | PRN
  Administered 2019-03-25: 250 mL via INTRAVENOUS

## 2019-03-22 MED ORDER — SODIUM CHLORIDE 0.9 % IV SOLN
INTRAVENOUS | Status: DC
  Administered 2019-03-24 – 2019-03-25 (×2): via INTRAVENOUS

## 2019-03-22 MED ORDER — INSULIN GLARGINE 100 UNIT/ML ~~LOC~~ SOLN
45.0000 [IU] | Freq: Two times a day (BID) | SUBCUTANEOUS | Status: DC
  Administered 2019-03-22 – 2019-03-25 (×7): 45 [IU] via SUBCUTANEOUS
  Filled 2019-03-22 (×9): qty 0.45

## 2019-03-22 MED ORDER — SODIUM CHLORIDE 0.9 % IV SOLN
2.0000 g | Freq: Three times a day (TID) | INTRAVENOUS | Status: DC
  Filled 2019-03-22 (×4): qty 2

## 2019-03-22 MED ORDER — VITAMIN C 500 MG PO TABS
250.0000 mg | ORAL_TABLET | Freq: Two times a day (BID) | ORAL | Status: DC
  Administered 2019-03-23 – 2019-03-28 (×9): 250 mg via ORAL
  Filled 2019-03-22 (×10): qty 1

## 2019-03-22 MED ORDER — SODIUM CHLORIDE 0.9 % IV BOLUS
500.0000 mL | Freq: Once | INTRAVENOUS | Status: AC
  Administered 2019-03-22: 08:00:00 via INTRAVENOUS

## 2019-03-22 MED ORDER — VANCOMYCIN HCL 1.5 G IV SOLR
1500.0000 mg | INTRAVENOUS | Status: DC
  Administered 2019-03-22 – 2019-03-25 (×4): 1500 mg via INTRAVENOUS
  Filled 2019-03-22 (×5): qty 1500

## 2019-03-22 MED ORDER — SODIUM CHLORIDE 0.9 % IV SOLN: INTRAVENOUS | Status: DC

## 2019-03-22 MED ORDER — SODIUM CHLORIDE 0.9 % IV BOLUS
500.0000 mL | Freq: Once | INTRAVENOUS | Status: AC
  Administered 2019-03-22: 10:00:00 500 mL via INTRAVENOUS

## 2019-03-22 MED ORDER — CEFAZOLIN SODIUM-DEXTROSE 2-4 GM/100ML-% IV SOLN
2.0000 g | Freq: Three times a day (TID) | INTRAVENOUS | Status: DC
  Administered 2019-03-22 – 2019-03-28 (×15): 2 g via INTRAVENOUS
  Filled 2019-03-22 (×24): qty 100

## 2019-03-22 MED ORDER — METRONIDAZOLE IN NACL 5-0.79 MG/ML-% IV SOLN
500.0000 mg | Freq: Three times a day (TID) | INTRAVENOUS | Status: DC
  Administered 2019-03-23 – 2019-03-27 (×12): 500 mg via INTRAVENOUS
  Filled 2019-03-22 (×17): qty 100

## 2019-03-22 MED ORDER — GABAPENTIN 100 MG PO CAPS
100.0000 mg | ORAL_CAPSULE | Freq: Three times a day (TID) | ORAL | Status: DC
  Administered 2019-03-22 – 2019-03-28 (×16): 100 mg via ORAL
  Filled 2019-03-22 (×17): qty 1

## 2019-03-22 MED ORDER — ADULT MULTIVITAMIN W/MINERALS CH
1.0000 | ORAL_TABLET | Freq: Every day | ORAL | Status: DC
  Administered 2019-03-24 – 2019-03-28 (×4): 1 via ORAL
  Filled 2019-03-22 (×4): qty 1

## 2019-03-22 MED ORDER — INSULIN ASPART 100 UNIT/ML ~~LOC~~ SOLN
0.0000 [IU] | Freq: Every day | SUBCUTANEOUS | Status: DC
  Administered 2019-03-23: 22:00:00 3 [IU] via SUBCUTANEOUS
  Filled 2019-03-22: qty 1

## 2019-03-22 NOTE — Progress Notes (Signed)
Initial Nutrition Assessment  DOCUMENTATION CODES:   Obesity unspecified  INTERVENTION:   Ensure Max protein supplement BID, each supplement provides 150kcal and 30g of protein.  MVI daily   Vitamin C 250mg  po BID   NUTRITION DIAGNOSIS:   Increased nutrient needs related to wound healing as evidenced by increased estimated needs.  GOAL:   Patient will meet greater than or equal to 90% of their needs  MONITOR:   PO intake, Supplement acceptance, Labs, Weight trends, I & O's, Skin  REASON FOR ASSESSMENT:   Malnutrition Screening Tool    ASSESSMENT:   43 y/o female with h/o DM, substance abuse, CHF, bipolar admitted with sepsis secondary to left great toe osteomyelitis/acute on chronic diabetic foot wound   RD working remotely.  Pt with increased estimated needs r/t wound healing. RD will add supplements and vitamins to support wound healing. Per chart, pt with 26lb(11%) weight loss over the past 4 months. RD unsure whether or not weight loss of intentional. Pt NPO today for MRI and possible amputation.     Medications reviewed and include: lasix, heparin, insulin, protonix, cefepime, vancomycin   Labs reviewed: Na 134(L), creat 1.15(H) Hgb 10.7(L), Hct 32.1(L) cbgs- 387, 277 x 24 hrs  Unable to complete Nutrition-Focused physical exam at this time.   Diet Order:   Diet Order            Diet NPO time specified  Diet effective now             EDUCATION NEEDS:   Not appropriate for education at this time  Skin:  Skin Assessment: Reviewed RN Assessment(diabetic foot wound L toe )  Last BM:  5/27  Height:   Ht Readings from Last 1 Encounters:  03/21/19 5\' 9"  (1.753 m)    Weight:   Wt Readings from Last 1 Encounters:  03/21/19 95.3 kg    Ideal Body Weight:  65.9 kg  BMI:  Body mass index is 31.01 kg/m.  Estimated Nutritional Needs:   Kcal:  1900-2200kcal/day   Protein:  95-110g/day   Fluid:  >2L/day   Koleen Distance MS, RD, LDN Pager  #- [phone] Office#- [phone] After Hours Pager: [phone]

## 2019-03-22 NOTE — Consult Note (Addendum)
Pharmacy Antibiotic Note  [name] is a 43 y.o. female admitted on 03/21/2019 with Osteomyelitis .  Pharmacy has been consulted for Cefepime and Vancomycin dosing. Patient's renal function has improved so doses/dosing intervals have been updated.  Plan: Vancomycin 1500 mg IV x 1 dose. Start Vancomycin 1500 mg IV Q 24 hrs. Goal AUC 400-550. Expected AUC: 532.5 Expected Css: 11.0 SCr used: 1.15  Changed Cefepime to 2g IV every 8 hours due to increased renal function.   Height: 5\' 9"  (175.3 cm) Weight: 210 lb (95.3 kg) IBW/kg (Calculated) : 66.2  Temp (24hrs), Avg:98.4 F (36.9 C), Min:97.9 F (36.6 C), Max:99 F (37.2 C)  Recent Labs  Lab 03/21/19 1826 03/21/19 2016 03/22/19 0446  WBC 9.7  --  5.9  CREATININE 1.67*  --  1.15*  LATICACIDVEN 4.2* 2.6*  --     Estimated Creatinine Clearance: 77.5 mL/min (A) (by C-G formula based on SCr of 1.15 mg/dL (H)).    Allergies  Allergen Reactions  . Albumin (Human)     unknown  . Erythromycin Rash  . Midecamycin Rash    Antimicrobials this admission: 5/28 vancomycin >>  5/28 cefepime >>   Microbiology results: 5/28 BCx: pending  5/28 SARS Coronavirus: negative   Thank you for allowing pharmacy to be a part of this patient's care.  Pearla Dubonnet, PharmD Clinical Pharmacist 03/22/2019 12:29 PM

## 2019-03-22 NOTE — Progress Notes (Signed)
Inpatient Diabetes Program Recommendations  AACE/ADA: New Consensus Statement on Inpatient Glycemic Control (2015)  Target Ranges:  Prepandial:   less than 140 mg/dL      Peak postprandial:   less than 180 mg/dL (1-2 hours)      Critically ill patients:  140 - 180 mg/dL   Results for BANITA, LEHN (MRN [id number]) as of 03/22/2019 07:37  Ref. Range 03/21/2019 22:47 03/22/2019 07:35  Glucose-Capillary Latest Ref Range: 70 - 99 mg/dL 387 (H)    40 units LANTUS given at 11:48pm 277 (H)    Admit with: Sepsis/ Toe Osteomyelitis/ Cellulitis  History: DM, CHF, Cocaine Abuse, Bipolar disorder  Home DM Meds: Lantus 40 units BID       Novolog 4-14 units TID       Metformin 1000 mg BID  Current Orders: Lantus 40 units BID      Novolog Moderate Correction Scale/ SSI (0-15 units) TID AC + HS    PCP: Priscille Kluver, FNP with [facility]--last seen 01/02/2019--Per PCP notes, pt intermittently compliant with taking insulin and checking CBGs   Note that Lantus started at midnight last night.  Next dose Lantus due at 10am today.  Novolog SSI to start at 8am today.  Agree with current orders.     --Will follow patient during hospitalization--  Wyn Quaker RN, MSN, CDE Diabetes Coordinator Inpatient Glycemic Control Team Team Pager: [phone] (8a-5p)

## 2019-03-22 NOTE — Progress Notes (Signed)
Patient BP 80/58.. MD notified. Orders to recheck in one hour.

## 2019-03-22 NOTE — Progress Notes (Signed)
PHARMACY - PHYSICIAN COMMUNICATION CRITICAL VALUE ALERT - BLOOD CULTURE IDENTIFICATION (BCID)  [name] is an 43 y.o. female who presented to [facility] on 03/21/2019 with a chief complaint of Shortness of Breath  Assessment:  1/4 bottles(anaerobic) positive for GPC S. Aureus, MEC-A negative   Name of physician (or Provider) Contacted: Dr. Jerelyn Charles  Current antibiotics: Cefepime, Vancomycin  Changes to prescribed antibiotics recommended:  Provider recommended repeat of blood cultures. Orders have been placed and will continue to monitor the results  Results for orders placed or performed during the hospital encounter of 03/21/19  Blood Culture ID Panel (Reflexed) (Collected: 03/21/2019  8:16 PM)  Result Value Ref Range   Enterococcus species NOT DETECTED NOT DETECTED   Listeria monocytogenes NOT DETECTED NOT DETECTED   Staphylococcus species DETECTED (A) NOT DETECTED   Staphylococcus aureus (BCID) DETECTED (A) NOT DETECTED   Methicillin resistance NOT DETECTED NOT DETECTED   Streptococcus species NOT DETECTED NOT DETECTED   Streptococcus agalactiae NOT DETECTED NOT DETECTED   Streptococcus pneumoniae NOT DETECTED NOT DETECTED   Streptococcus pyogenes NOT DETECTED NOT DETECTED   Acinetobacter baumannii NOT DETECTED NOT DETECTED   Enterobacteriaceae species NOT DETECTED NOT DETECTED   Enterobacter cloacae complex NOT DETECTED NOT DETECTED   Escherichia coli NOT DETECTED NOT DETECTED   Klebsiella oxytoca NOT DETECTED NOT DETECTED   Klebsiella pneumoniae NOT DETECTED NOT DETECTED   Proteus species NOT DETECTED NOT DETECTED   Serratia marcescens NOT DETECTED NOT DETECTED   Haemophilus influenzae NOT DETECTED NOT DETECTED   Neisseria meningitidis NOT DETECTED NOT DETECTED   Pseudomonas aeruginosa NOT DETECTED NOT DETECTED   Candida albicans NOT DETECTED NOT DETECTED   Candida glabrata NOT DETECTED NOT DETECTED   Candida krusei NOT DETECTED NOT DETECTED   Candida parapsilosis NOT  DETECTED NOT DETECTED   Candida tropicalis NOT DETECTED NOT DETECTED    [name] 03/22/2019  11:17 AM

## 2019-03-22 NOTE — Consult Note (Signed)
NAME: [name]  DOB: [birth year]  MRN: [id number]  Date/Time: 03/22/2019 11:06 AM  REQUESTING PROVIDER: Dr.Troxler Subjective:  REASON FOR CONSULT: left toe infection ? [name] is a 43 y.o. with a history of DM, chronic left great toe infection is admitted with worsening necrosis/infection of the great toe. 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   She has no fever She apparently had some left sided transient weakness a day before She says she used cocaine before   Past Medical History:  Diagnosis Date  . Chronic combined systolic and diastolic CHF (congestive heart failure) ([place])    a. ECHO 04/23/14 LV EF: 45% - 50%,  mild LVH, diffuse hypokinesis. LV diastolic dysfunction, high ventricular filling pressure. Mild LA dilation.  . Cocaine abuse ([place])   . Diabetes mellitus without complication ([place])   . Hypertension   . Seizures ([place])     Past Surgical History:  Procedure Laterality Date  . CHOLECYSTECTOMY  2005  . LOWER EXTREMITY ANGIOGRAPHY Left 03/12/2019   Procedure: LOWER EXTREMITY ANGIOGRAPHY;  Surgeon: Katha Cabal, MD;  Location: [facility];  Service: Cardiovascular;  Laterality: Left;  . TUBAL LIGATION     left ankle surgery following fracture Social History   Socioeconomic History  . Marital status: Single    Spouse name: Not on file  . Number of children: 0  . Years of education: 37  . Highest education  level: Not on file  Occupational History  . Occupation: Unemployed  Social Needs  . Financial resource strain: Not on file  . Food insecurity:    Worry: Not on file    Inability: Not on file  . Transportation needs:    Medical: Not on file    Non-medical: Not on file  Tobacco Use  . Smoking status: Current Every Day Smoker    Packs/day: 1.00    Years: 28.00    Pack years: 28.00  . Smokeless tobacco: Never Used  Substance and Sexual Activity  . Alcohol use: Not Currently    Alcohol/week: 0.0 standard drinks    Comment: Quit drinking 8 months ago (09/01/15)  . Drug use: Not Currently    Types: Cocaine  . Sexual activity: Yes  Lifestyle  . Physical activity:    Days per week: Not on file    Minutes per session: Not on file  . Stress: Not on file  Relationships  . Social connections:    Talks on phone: Not on file    Gets together: Not on file    Attends religious service: Not on file    Active member of club or organization: Not on file    Attends meetings of clubs or organizations: Not on file    Relationship status: Not on file  . Intimate partner violence:    Fear of current or ex partner: Not on  file    Emotionally abused: Not on file    Physically abused: Not on file    Forced sexual activity: Not on file  Other Topics Concern  . Not on file  Social History Narrative   Lives at home mother.    Caffeine use: Soda (drinks 3L per day)    Family History  Problem Relation Age of Onset  . Hypertension Other   . Drug abuse Mother   . Seizures Neg Hx    Allergies  Allergen Reactions  . Albumin (Human)     unknown  . Erythromycin Rash  . Midecamycin Rash   ? Current Facility-Administered Medications  Medication Dose Route Frequency Provider Last Rate Last Dose  . acetaminophen (TYLENOL) tablet 650 mg  650 mg Oral Q6H PRN Lance Coon, MD   650 mg at 03/22/19 0548   Or  . acetaminophen (TYLENOL) suppository 650 mg  650 mg Rectal Q6H PRN Lance Coon, MD       . amitriptyline (ELAVIL) tablet 150 mg  150 mg Oral Corwin Levins, MD   150 mg at 03/21/19 2306  . atorvastatin (LIPITOR) tablet 20 mg  20 mg Oral Corwin Levins, MD   20 mg at 03/21/19 2350  . ceFEPIme (MAXIPIME) 2 g in sodium chloride 0.9 % 100 mL IVPB  2 g Intravenous Q12H Hallaji, Sheema M, RPH 200 mL/hr at 03/21/19 2356 2 g at 03/21/19 2356  . furosemide (LASIX) tablet 20 mg  20 mg Oral BID Loney Hering D, MD   20 mg at 03/21/19 2306  . gabapentin (NEURONTIN) capsule 100 mg  100 mg Oral TID Loney Hering D, MD      . heparin injection 5,000 Units  5,000 Units Subcutaneous Camelia Phenes Lance Coon, MD   5,000 Units at 03/22/19 [phone]  . hydrOXYzine (ATARAX/VISTARIL) tablet 50 mg  50 mg Oral BID Lance Coon, MD   50 mg at 03/21/19 2349  . insulin aspart (novoLOG) injection 0-20 Units  0-20 Units Subcutaneous TID WC Salary, Montell D, MD      . insulin aspart (novoLOG) injection 0-5 Units  0-5 Units Subcutaneous QHS Salary, Montell D, MD      . insulin glargine (LANTUS) injection 45 Units  45 Units Subcutaneous BID Salary, Montell D, MD      . metoprolol tartrate (LOPRESSOR) tablet 50 mg  50 mg Oral BID Salary, Montell D, MD      . ondansetron (ZOFRAN) tablet 4 mg  4 mg Oral Q6H PRN Lance Coon, MD       Or  . ondansetron [facility]) injection 4 mg  4 mg Intravenous Q6H PRN Lance Coon, MD      . pantoprazole (PROTONIX) EC tablet 20 mg  20 mg Oral Daily Lance Coon, MD      . QUEtiapine (SEROQUEL) tablet 800 mg  800 mg Oral Corwin Levins, MD   800 mg at 03/21/19 2350  . vancomycin (VANCOCIN) IVPB 1000 mg/200 mL premix  1,000 mg Intravenous Q24H Hallaji, Sheema M, RPH      . venlafaxine XR (EFFEXOR-XR) 24 hr capsule 300 mg  300 mg Oral Daily Lance Coon, MD         Abtx:  Anti-infectives (From admission, onward)   Start     Dose/Rate Route Frequency Ordered Stop   03/22/19 2000  vancomycin (VANCOCIN) IVPB 1000 mg/200 mL premix     1,000 mg 200 mL/hr over 60 Minutes  Intravenous Every 24 hours 03/21/19 2237  03/22/19 0800  vancomycin (VANCOCIN) IVPB 1000 mg/200 mL premix  Status:  Discontinued     1,000 mg 200 mL/hr over 60 Minutes Intravenous Every 24 hours 03/21/19 2232 03/21/19 2237   03/21/19 2200  ceFEPIme (MAXIPIME) 2 g in sodium chloride 0.9 % 100 mL IVPB     2 g 200 mL/hr over 30 Minutes Intravenous Every 12 hours 03/21/19 2142     03/21/19 2030  vancomycin (VANCOCIN) 1,500 mg in sodium chloride 0.9 % 500 mL IVPB     1,500 mg 250 mL/hr over 120 Minutes Intravenous  Once 03/21/19 2002 03/21/19 2226   03/21/19 2000  vancomycin (VANCOCIN) IVPB 1000 mg/200 mL premix  Status:  Discontinued     1,000 mg 200 mL/hr over 60 Minutes Intravenous  Once 03/21/19 1951 03/21/19 2002      REVIEW OF SYSTEMS:  Const: negative fever, negative chills, negative weight loss Eyes: negative diplopia or visual changes, negative eye pain ENT: negative coryza, negative sore throat Resp: negative cough, hemoptysis, +dyspnea Cards: negative for chest pain, +palpitations, lower extremity edema GU: negative for frequency, dysuria and hematuria GI: Negative for abdominal pain, diarrhea, bleeding, constipation Skin: negative for rash and pruritus Heme: negative for easy bruising and gum/nose bleeding MS: negative for myalgias, arthralgias, back pain and muscle weakness Neurolo:negative for headaches, dizziness, vertigo, memory problems +transient left ided weakness Psych: negative for feelings of anxiety, depression  Endocrine: not well controlled Allergy/Immunology- as noted  ?  Objective:  VITALS:  BP (!) 86/38 (BP Location: Left Arm)   Pulse 81   Temp 97.9 F (36.6 C) (Oral)   Resp 15   Ht 5\' 9"  (1.753 m)   Wt 95.3 kg   SpO2 99%   BMI 31.01 kg/m  PHYSICAL EXAM:  General: Alert, cooperative, no distress, appears stated age.  Head: Normocephalic, without obvious abnormality, atraumatic. Eyes: Conjunctivae clear, anicteric sclerae. Pupils are equal ENT  Nares normal. No drainage or sinus tenderness. Lips, mucosa, and tongue normal. No Thrush edentulous Neck: Supple, symmetrical, no adenopathy, thyroid: non tender no carotid bruit and no JVD. Back: No CVA tenderness. Lungs: Clear to auscultation bilaterally. No Wheezing or Rhonchi. No rales. Heart: Regular rate and rhythm, no murmur, rub or gallop. Abdomen: Soft, non-tender,not distended. Bowel sounds normal. No masses Extremities: left foot swollen, erythema of the dorsum, rt great toe necrotic       Skin: No rashes or lesions. Or bruising Lymph: Cervical, supraclavicular normal. Neurologic: Grossly non-focal Pertinent Labs Lab Results CBC    Component Value Date/Time   WBC 5.9 03/22/2019 0446   RBC 3.57 (L) 03/22/2019 0446   HGB 10.7 (L) 03/22/2019 0446   HCT 32.1 (L) 03/22/2019 0446   PLT 270 03/22/2019 0446   MCV 89.9 03/22/2019 0446   MCH 30.0 03/22/2019 0446   MCHC 33.3 03/22/2019 0446   RDW 13.2 03/22/2019 0446   LYMPHSABS 1.3 03/21/2019 1826   MONOABS 1.0 03/21/2019 1826   EOSABS 0.1 03/21/2019 1826   BASOSABS 0.0 03/21/2019 1826    CMP Latest Ref Rng & Units 03/22/2019 03/21/2019 03/12/2019  Glucose 70 - 99 mg/dL 303(H) 619(HH) -  BUN 6 - 20 mg/dL 10 12 19   Creatinine 0.44 - 1.00 mg/dL 1.15(H) 1.67(H) 1.73(H)  Sodium 135 - 145 mmol/L 134(L) 128(L) -  Potassium 3.5 - 5.1 mmol/L 3.6 3.8 -  Chloride 98 - 111 mmol/L 102 91(L) -  CO2 22 - 32 mmol/L 23 26 -  Calcium 8.9 - 10.3 mg/dL 8.2(L) 8.8(L) -  Total Protein 6.0 - 8.3 g/dL - - -  Total Bilirubin 0.3 - 1.2 mg/dL - - -  Alkaline Phos 39 - 117 U/L - - -  AST 0 - 37 U/L - - -  ALT 0 - 35 U/L - - -      Microbiology: Recent Results (from the past 240 hour(s))  SARS Coronavirus 2 [facility] order, Performed in [place] hospital lab)     Status: None   Collection Time: 03/21/19  8:16 PM  Result Value Ref Range Status   SARS Coronavirus 2 NEGATIVE NEGATIVE Final    Comment: (NOTE) If result is NEGATIVE  SARS-CoV-2 target nucleic acids are NOT DETECTED. The SARS-CoV-2 RNA is generally detectable in upper and lower  respiratory specimens during the acute phase of infection. The lowest  concentration of SARS-CoV-2 viral copies this assay can detect is 250  copies / mL. A negative result does not preclude SARS-CoV-2 infection  and should not be used as the sole basis for treatment or other  patient management decisions.  A negative result may occur with  improper specimen collection / handling, submission of specimen other  than nasopharyngeal swab, presence of viral mutation(s) within the  areas targeted by this assay, and inadequate number of viral copies  (<250 copies / mL). A negative result must be combined with clinical  observations, patient history, and epidemiological information. If result is POSITIVE SARS-CoV-2 target nucleic acids are DETECTED. The SARS-CoV-2 RNA is generally detectable in upper and lower  respiratory specimens dur ing the acute phase of infection.  Positive  results are indicative of active infection with SARS-CoV-2.  Clinical  correlation with patient history and other diagnostic information is  necessary to determine patient infection status.  Positive results do  not rule out bacterial infection or co-infection with other viruses. If result is PRESUMPTIVE POSTIVE SARS-CoV-2 nucleic acids MAY BE PRESENT.   A presumptive positive result was obtained on the submitted specimen  and confirmed on repeat testing.  While 2019 novel coronavirus  (SARS-CoV-2) nucleic acids may be present in the submitted sample  additional confirmatory testing may be necessary for epidemiological  and / or clinical management purposes  to differentiate between  SARS-CoV-2 and other Sarbecovirus currently known to infect humans.  If clinically indicated additional testing with an alternate test  methodology [phone]) is advised. The SARS-CoV-2 RNA is generally  detectable in upper  and lower respiratory sp ecimens during the acute  phase of infection. The expected result is Negative. Fact Sheet for Patients:  StrictlyIdeas.no Fact Sheet for Healthcare Providers: BankingDealers.co.za This test is not yet approved or cleared by the [place] FDA and has been authorized for detection and/or diagnosis of SARS-CoV-2 by FDA under an Emergency Use Authorization (EUA).  This EUA will remain in effect (meaning this test can be used) for the duration of the COVID-19 declaration under Section 564(b)(1) of the Act, 21 U.S.C. section 360bbb-3(b)(1), unless the authorization is terminated or revoked sooner. Performed at [facility], [address] [place], [place] [address]   Blood culture (routine x 2)     Status: None (Preliminary result)   Collection Time: 03/21/19  8:16 PM  Result Value Ref Range Status   Specimen Description BLOOD RIGHT ANTECUBITAL  Final   Special Requests   Final    BOTTLES DRAWN AEROBIC AND ANAEROBIC Blood Culture adequate volume   Culture  Setup Time   Final    Organism ID to follow [place]  ANAEROBIC BOTTLE ONLY CRITICAL RESULT CALLED TO, READ BACK BY AND VERIFIED WITH: KAREN HAYES ON 03/22/2019 AT 1051 QSD Performed at [facility], [address] [place], [place] [address]    Culture [facility] POSITIVE COCCI  Final   Report Status PENDING  Incomplete  Blood culture (routine x 2)     Status: None (Preliminary result)   Collection Time: 03/21/19  8:16 PM  Result Value Ref Range Status   Specimen Description BLOOD BLOOD RIGHT HAND  Final   Special Requests   Final    BOTTLES DRAWN AEROBIC AND ANAEROBIC Blood Culture adequate volume   Culture   Final    NO GROWTH < 12 HOURS Performed at [facility], [address] [place], [place] [address]    Report Status PENDING  Incomplete  Blood Culture ID Panel (Reflexed)     Status: Abnormal   Collection Time:  03/21/19  8:16 PM  Result Value Ref Range Status   Enterococcus species NOT DETECTED NOT DETECTED Final   Listeria monocytogenes NOT DETECTED NOT DETECTED Final   Staphylococcus species DETECTED (A) NOT DETECTED Final    Comment: CRITICAL RESULT CALLED TO, READ BACK BY AND VERIFIED WITH: KAREN HAYES ON 03/22/2019 AT 1051 QSD    Staphylococcus aureus (BCID) DETECTED (A) NOT DETECTED Final    Comment: Methicillin (oxacillin) susceptible Staphylococcus aureus (MSSA). Preferred therapy is anti staphylococcal beta lactam antibiotic (Cefazolin or Nafcillin), unless clinically contraindicated. CRITICAL RESULT CALLED TO, READ BACK BY AND VERIFIED WITH: KAREN HAYES ON 03/22/2019 AT 1051 QSD    Methicillin resistance NOT DETECTED NOT DETECTED Final   Streptococcus species NOT DETECTED NOT DETECTED Final   Streptococcus agalactiae NOT DETECTED NOT DETECTED Final   Streptococcus pneumoniae NOT DETECTED NOT DETECTED Final   Streptococcus pyogenes NOT DETECTED NOT DETECTED Final   Acinetobacter baumannii NOT DETECTED NOT DETECTED Final   Enterobacteriaceae species NOT DETECTED NOT DETECTED Final   Enterobacter cloacae complex NOT DETECTED NOT DETECTED Final   Escherichia coli NOT DETECTED NOT DETECTED Final   Klebsiella oxytoca NOT DETECTED NOT DETECTED Final   Klebsiella pneumoniae NOT DETECTED NOT DETECTED Final   Proteus species NOT DETECTED NOT DETECTED Final   Serratia marcescens NOT DETECTED NOT DETECTED Final   Haemophilus influenzae NOT DETECTED NOT DETECTED Final   Neisseria meningitidis NOT DETECTED NOT DETECTED Final   Pseudomonas aeruginosa NOT DETECTED NOT DETECTED Final   Candida albicans NOT DETECTED NOT DETECTED Final   Candida glabrata NOT DETECTED NOT DETECTED Final   Candida krusei NOT DETECTED NOT DETECTED Final   Candida parapsilosis NOT DETECTED NOT DETECTED Final   Candida tropicalis NOT DETECTED NOT DETECTED Final    Comment: Performed at [facility], [address] [place], [place] [address]    IMAGING RESULTS: CT head without contrast-no pathology Xray foot    Markedly worsened appearance of the great toe with findings  consistent with osteomyelitis throughout the distal phalanx and at least the distal 2 cm of the proximal phalanx. The distal phalanx is dorsally dislocated I have personally reviewed the films ? Impression/Recommendation ? Left foot infection with great toe necrosis in  patient with DM ,was on Tedizolid as OP - now on VAnco ?Both MSSA and MRSA were cultured before. Awaiting surgery  Staph  aureus bacteremia- MSSA- will add cefazolin Needs 2 d echo/TEE especially with episodes of transient left sided weakness need to r/o endocarditis  H/o cocaine use   DM- not well controlled  Hardware left  ankle  Discussed with patient Discussed the management with Dr.Troxler ___________________________________________________  Note:  This document was prepared using Dragon voice recognition software and may include unintentional dictation errors.

## 2019-03-22 NOTE — Consult Note (Signed)
[facility]iatry                                                      Patient Demographics  Hayle Parisi, is a 43 y.o. female   MRN: [id number]   DOB - [birth year]  Admit Date - 03/21/2019    Outpatient Primary MD for the patient is Lacie Draft, NP  Consult requested in the Hospital by Gorden Harms, MD, On 03/22/2019    Reason for consult gangrene infections great toe   With History of -  Past Medical History:  Diagnosis Date  . Chronic combined systolic and diastolic CHF (congestive heart failure) ([place])    a. ECHO 04/23/14 LV EF: 45% - 50%,  mild LVH, diffuse hypokinesis. LV diastolic dysfunction, high ventricular filling pressure. Mild LA dilation.  . Cocaine abuse ([place])   . Diabetes mellitus without complication ([place])   . Hypertension   . Seizures ([place])       Past Surgical History:  Procedure Laterality Date  . CHOLECYSTECTOMY  2005  . LOWER EXTREMITY ANGIOGRAPHY Left 03/12/2019   Procedure: LOWER EXTREMITY ANGIOGRAPHY;  Surgeon: Katha Cabal, MD;  Location: [facility];  Service: Cardiovascular;  Laterality: Left;  . TUBAL LIGATION      in for   Chief Complaint  Patient presents with  . Claudication  . Shortness of Breath     HPI  Hilarie Sinha  is a 43 y.o. female, patient's had a history of wounds on his toe for a number of months.  Underwent angioplasty last week.  Wound has gotten progressively worse and toe color changes have developed.    Review of Systems    In addition to the HPI above,  No Fever-chills, No Headache, No changes with Vision or hearing, No problems swallowing food or Liquids, No Chest pain, Cough or Shortness of Breath, No Abdominal pain, No Nausea or Vommitting, Bowel movements are regular, No Blood in stool or Urine, No dysuria, No new skin rashes or bruises, No new joints  pains-aches,  No new weakness, tingling, numbness in any extremity, No recent weight gain or loss, No polyuria, polydypsia or polyphagia, No significant Mental Stressors.  A full 10 point Review of Systems was done, except as stated above, all other Review of Systems were negative.   Social History Social History   Tobacco Use  . Smoking status: Current Every Day Smoker    Packs/day: 1.00    Years: 28.00    Pack years: 28.00  . Smokeless tobacco: Never Used  Substance Use Topics  . Alcohol use: Not Currently    Alcohol/week: 0.0 standard drinks    Comment: Quit drinking 8 months ago (09/01/15)    Family History Family History  Problem Relation Age of Onset  . Hypertension Other   . Drug abuse Mother   . Seizures Neg Hx     Prior to Admission medications   Medication Sig Start Date End Date Taking? Authorizing Provider  amitriptyline (ELAVIL) 150 MG tablet Take 150 mg by mouth at bedtime.   Yes [provider]  atorvastatin (LIPITOR) 20 MG tablet Take 20 mg by mouth at bedtime.    Yes [provider]  furosemide (LASIX) 20 MG tablet Take 20 mg by mouth 2 (two) times a day.   Yes [provider]  gabapentin (NEURONTIN) 600 MG tablet Take 1 tablet (600 mg total) by mouth 3 (three) times daily. 09/28/18  Yes Stover, Titorya, DPM  hydrOXYzine (ATARAX/VISTARIL) 50 MG tablet Take 50 mg by mouth 2 (two) times a day.  08/21/18  Yes [provider]  insulin aspart (NOVOLOG) 100 UNIT/ML injection Inject 4-14 Units into the skin 3 (three) times daily as needed for high blood sugar.   Yes [provider]  insulin glargine (LANTUS) 100 UNIT/ML injection Inject 40 Units into the skin 2 (two) times a day.   Yes [provider]  QUEtiapine (SEROQUEL) 400 MG tablet Take 800 mg by mouth at bedtime.   Yes [provider]  ACCU-CHEK AVIVA PLUS test strip USE TO CHECK BLOOD SUGAR QD 08/22/18   [provider]  Insulin  Syringes, Disposable, U-100 0.3 ML MISC Inject 24 Units into the skin at bedtime. 05/14/14   Love, Ivan Anchors, PA-C  meloxicam (MOBIC) 7.5 MG tablet Take 7.5 mg by mouth daily as needed for pain.  08/21/18   [provider]  metFORMIN (GLUCOPHAGE) 1000 MG tablet Take 1,000 mg by mouth 2 (two) times daily with a meal.    [provider]  metoprolol tartrate (LOPRESSOR) 50 MG tablet Take 50 mg by mouth 2 (two) times daily.    [provider]  pantoprazole (PROTONIX) 20 MG tablet Take 20 mg by mouth daily.  08/21/18   [provider]  prazosin (MINIPRESS) 5 MG capsule Take 5 mg by mouth at bedtime.    [provider]  promethazine (PHENERGAN) 12.5 MG tablet Take 12.5 mg by mouth 2 (two) times daily as needed for nausea or vomiting.  08/21/18   [provider]  SIVEXTRO 200 MG TABS Take 1 tablet by mouth daily. Patient taking differently: Take 1 tablet by mouth at bedtime.  02/28/19   Powers, Evern Core, MD  venlafaxine XR (EFFEXOR-XR) 150 MG 24 hr capsule Take 300 mg by mouth daily.  09/14/18   [provider]    Anti-infectives (From admission, onward)   Start     Dose/Rate Route Frequency Ordered Stop   03/22/19 2000  vancomycin (VANCOCIN) IVPB 1000 mg/200 mL premix     1,000 mg 200 mL/hr over 60 Minutes Intravenous Every 24 hours 03/21/19 2237     03/22/19 0800  vancomycin (VANCOCIN) IVPB 1000 mg/200 mL premix  Status:  Discontinued     1,000 mg 200 mL/hr over 60 Minutes Intravenous Every 24 hours 03/21/19 2232 03/21/19 2237   03/21/19 2200  ceFEPIme (MAXIPIME) 2 g in sodium chloride 0.9 % 100 mL IVPB     2 g 200 mL/hr over 30 Minutes Intravenous Every 12 hours 03/21/19 2142     03/21/19 2030  vancomycin (VANCOCIN) 1,500 mg in sodium chloride 0.9 % 500 mL IVPB     1,500 mg 250 mL/hr over 120 Minutes Intravenous  Once 03/21/19 2002 03/21/19 2226   03/21/19 2000  vancomycin (VANCOCIN) IVPB 1000 mg/200 mL premix  Status:  Discontinued      1,000 mg 200 mL/hr over 60 Minutes Intravenous  Once 03/21/19 1951 03/21/19 2002      Scheduled Meds: . amitriptyline  150 mg Oral QHS  . atorvastatin  20 mg Oral QHS  . furosemide  20 mg Oral BID  . gabapentin  600 mg Oral TID  . heparin  5,000 Units Subcutaneous Q8H  . hydrOXYzine  50 mg Oral BID  . insulin aspart  0-15  Units Subcutaneous TID AC & HS  . insulin glargine  40 Units Subcutaneous BID  . metoprolol tartrate  50 mg Oral BID  . pantoprazole  20 mg Oral Daily  . QUEtiapine  800 mg Oral QHS  . venlafaxine XR  300 mg Oral Daily   Continuous Infusions: . ceFEPime (MAXIPIME) IV 2 g (03/21/19 2356)  . vancomycin     PRN Meds:.acetaminophen **OR** acetaminophen, ondansetron **OR** ondansetron (ZOFRAN) IV  Allergies  Allergen Reactions  . Albumin (Human)     unknown  . Erythromycin Rash  . Midecamycin Rash    Physical Exam  Vitals  Blood pressure (!) 78/59, pulse 76, temperature 97.9 F (36.6 C), temperature source Oral, resp. rate 15, height 5\' 9"  (1.753 m), weight 95.3 kg, SpO2 99 %.  Lower Extremity exam:  Vascular: Palpable but diminished on the left foot  Dermatological: Patient has a wound plantarly on the left great toe which penetrates down to the bone.  There is some purulence in the region.  Toe is performed of the joint because of bony erosions.  Patient clearly has bone involvement in the digit.  Neurological: Severe peripheral neuropathy  Ortho: Gangrene left great toe with osteomyelitis most likely great toe but concerns are there may be some progression to the metatarsal head as well.  We will need to get an MRI to rule this out.  Data Review  CBC Recent Labs  Lab 03/21/19 1826 03/22/19 0446  WBC 9.7 5.9  HGB 12.6 10.7*  HCT 36.8 32.1*  PLT 312 270  MCV 88.7 89.9  MCH 30.4 30.0  MCHC 34.2 33.3  RDW 13.0 13.2  LYMPHSABS 1.3  --   MONOABS 1.0  --   EOSABS 0.1  --   BASOSABS 0.0  --     ------------------------------------------------------------------------------------------------------------------  Chemistries  Recent Labs  Lab 03/21/19 1826 03/22/19 0446  NA 128* 134*  K 3.8 3.6  CL 91* 102  CO2 26 23  GLUCOSE 619* 303*  BUN 12 10  CREATININE 1.67* 1.15*  CALCIUM 8.8* 8.2*   ------------------------------------------------------------------------------------------------------------------ estimated creatinine clearance is 77.5 mL/min (A) (by C-G formula based on SCr of 1.15 mg/dL (H)). ------------------------------------------------------------------------------------------------------------------ No results for input(s): TSH, T4TOTAL, T3FREE, THYROIDAB in the last 72 hours.  Invalid input(s): FREET3 Urinalysis    Component Value Date/Time   COLORURINE YELLOW 04/29/2014 0001   APPEARANCEUR CLOUDY (A) 04/29/2014 0001   LABSPEC 1.013 04/29/2014 0001   PHURINE 6.5 04/29/2014 0001   GLUCOSEU 100 (A) 04/29/2014 0001   HGBUR NEGATIVE 04/29/2014 0001   BILIRUBINUR NEGATIVE 04/29/2014 0001   KETONESUR NEGATIVE 04/29/2014 0001   PROTEINUR NEGATIVE 04/29/2014 0001   UROBILINOGEN 0.2 04/29/2014 0001   NITRITE NEGATIVE 04/29/2014 0001   LEUKOCYTESUR SMALL (A) 04/29/2014 0001     Imaging results:   Ct Head Wo Contrast  Result Date: 03/21/2019 CLINICAL DATA:  Left-sided weakness, dizziness EXAM: CT HEAD WITHOUT CONTRAST TECHNIQUE: Contiguous axial images were obtained from the base of the skull through the vertex without intravenous contrast. COMPARISON:  None. FINDINGS: Brain: No evidence of acute infarction, hemorrhage, hydrocephalus, extra-axial collection or mass lesion/mass effect. Vascular: No hyperdense vessel or unexpected calcification. Skull: No osseous abnormality. Sinuses/Orbits: Visualized paranasal sinuses are clear. Visualized mastoid sinuses are clear. Visualized orbits demonstrate no focal abnormality. Other: None IMPRESSION: No acute  intracranial pathology. Electronically Signed   By: Kathreen Devoid   On: 03/21/2019 18:41   Dg Chest Portable 1 View  Result Date: 03/21/2019 CLINICAL DATA:  Shortness  of breath for 2-3 weeks. EXAM: PORTABLE CHEST 1 VIEW COMPARISON:  11/15/2010 FINDINGS: The cardiac silhouette, mediastinal and hilar contours are within normal limits and stable. The lungs are clear. No pleural effusions. The bony thorax is intact. IMPRESSION: No acute cardiopulmonary findings. Electronically Signed   By: Marijo Sanes M.D.   On: 03/21/2019 18:49   Dg Foot Complete Left  Result Date: 03/21/2019 CLINICAL DATA:  Chronic open wound on the left great toe. Pain and swelling. Subsequent encounter. No known injury. EXAM: LEFT FOOT - COMPLETE 3+ VIEW COMPARISON:  Plain films left foot 02/12/2019. FINDINGS: Large open wound along the medial margin of the great toe has worsened. Extensive bony destructive change is now seen throughout the distal phalanx and approximately the distal 2 cm of the proximal phalanx. The distal phalanx is dorsally dislocated. Soft tissues are markedly swollen throughout the foot and worse than on the prior study. Postoperative change of fixation of ankle fractures noted. IMPRESSION: Markedly worsened appearance of the great toe with findings consistent with osteomyelitis throughout the distal phalanx and at least the distal 2 cm of the proximal phalanx. The distal phalanx is dorsally dislocated. Soft tissue wound along the medial aspect of the great toe has markedly worsened since the prior examination. Diffuse soft tissue swelling about the foot compatible with dependent change and/or cellulitis is much worse than on the prior study. Electronically Signed   By: Inge Rise M.D.   On: 03/21/2019 18:48    Assessment & Plan: I explained to the patient that the toe is not salvageable at this point due to the gangrenous changes left circulation severe osteomyelitic changes.  Plan will try to remove the  great toe but elected an MRI first to make sure there is no osteomyelitis in the metatarsal head.  I will try to get this set up for today pending her ability and status according to the hospitalist.  Principal Problem:   Sepsis ([place]) Active Problems:   Bipolar disorder, unspecified ([place])   DM type 2 (diabetes mellitus, type 2)- poorly controlled   HTN (hypertension)   Chronic combined systolic and diastolic CHF (congestive heart failure) ([place])   Toe osteomyelitis, left ([place])   Family Communication: Plan discussed with patient and **  [name] Patricia M.D on 03/22/2019 at 8:10 AM  Thank you for the consult, we will follow the patient with you in the Hospital.

## 2019-03-22 NOTE — Progress Notes (Signed)
[place] at [place] NAME: [name]    MR#:  [id number]  DATE OF BIRTH:  1976/06/30  SUBJECTIVE:  CHIEF COMPLAINT:   Chief Complaint  Patient presents with  . Claudication  . Shortness of Breath    REVIEW OF SYSTEMS:  CONSTITUTIONAL: No fever, fatigue or weakness.  EYES: No blurred or double vision.  EARS, NOSE, AND THROAT: No tinnitus or ear pain.  RESPIRATORY: No cough, shortness of breath, wheezing or hemoptysis.  CARDIOVASCULAR: No chest pain, orthopnea, edema.  GASTROINTESTINAL: No nausea, vomiting, diarrhea or abdominal pain.  GENITOURINARY: No dysuria, hematuria.  ENDOCRINE: No polyuria, nocturia,  HEMATOLOGY: No anemia, easy bruising or bleeding SKIN: No rash or lesion. MUSCULOSKELETAL: No joint pain or arthritis.   NEUROLOGIC: No tingling, numbness, weakness.  PSYCHIATRY: No anxiety or depression.   ROS  DRUG ALLERGIES:   Allergies  Allergen Reactions  . Albumin (Human)     unknown  . Erythromycin Rash  . Midecamycin Rash    VITALS:  Blood pressure (!) 86/38, pulse 81, temperature 97.9 F (36.6 C), temperature source Oral, resp. rate 15, height 5\' 9"  (1.753 m), weight 95.3 kg, SpO2 99 %.  PHYSICAL EXAMINATION:  GENERAL:  43 y.o.-year-old patient lying in the bed with no acute distress.  EYES: Pupils equal, round, reactive to light and accommodation. No scleral icterus. Extraocular muscles intact.  HEENT: Head atraumatic, normocephalic. Oropharynx and nasopharynx clear.  NECK:  Supple, no jugular venous distention. No thyroid enlargement, no tenderness.  LUNGS: Normal breath sounds bilaterally, no wheezing, rales,rhonchi or crepitation. No use of accessory muscles of respiration.  CARDIOVASCULAR: S1, S2 normal. No murmurs, rubs, or gallops.  ABDOMEN: Soft, nontender, nondistended. Bowel sounds present. No organomegaly or mass.  EXTREMITIES: No pedal edema, cyanosis, or clubbing.  NEUROLOGIC: Cranial nerves II  through XII are intact. Muscle strength 5/5 in all extremities. Sensation intact. Gait not checked.  PSYCHIATRIC: The patient is alert and oriented x 3.  SKIN: No obvious rash, lesion, or ulcer.   Physical Exam LABORATORY PANEL:   CBC Recent Labs  Lab 03/22/19 0446  WBC 5.9  HGB 10.7*  HCT 32.1*  PLT 270   ------------------------------------------------------------------------------------------------------------------  Chemistries  Recent Labs  Lab 03/22/19 0446  NA 134*  K 3.6  CL 102  CO2 23  GLUCOSE 303*  BUN 10  CREATININE 1.15*  CALCIUM 8.2*   ------------------------------------------------------------------------------------------------------------------  Cardiac Enzymes Recent Labs  Lab 03/21/19 1826  TROPONINI <0.03   ------------------------------------------------------------------------------------------------------------------  RADIOLOGY:  Ct Head Wo Contrast  Result Date: 03/21/2019 CLINICAL DATA:  Left-sided weakness, dizziness EXAM: CT HEAD WITHOUT CONTRAST TECHNIQUE: Contiguous axial images were obtained from the base of the skull through the vertex without intravenous contrast. COMPARISON:  None. FINDINGS: Brain: No evidence of acute infarction, hemorrhage, hydrocephalus, extra-axial collection or mass lesion/mass effect. Vascular: No hyperdense vessel or unexpected calcification. Skull: No osseous abnormality. Sinuses/Orbits: Visualized paranasal sinuses are clear. Visualized mastoid sinuses are clear. Visualized orbits demonstrate no focal abnormality. Other: None IMPRESSION: No acute intracranial pathology. Electronically Signed   By: Kathreen Devoid   On: 03/21/2019 18:41   Dg Chest Portable 1 View  Result Date: 03/21/2019 CLINICAL DATA:  Shortness of breath for 2-3 weeks. EXAM: PORTABLE CHEST 1 VIEW COMPARISON:  11/15/2010 FINDINGS: The cardiac silhouette, mediastinal and hilar contours are within normal limits and stable. The lungs are clear. No  pleural effusions. The bony thorax is intact. IMPRESSION: No acute cardiopulmonary findings. Electronically Signed  By: Marijo Sanes M.D.   On: 03/21/2019 18:49   Dg Foot Complete Left  Result Date: 03/21/2019 CLINICAL DATA:  Chronic open wound on the left great toe. Pain and swelling. Subsequent encounter. No known injury. EXAM: LEFT FOOT - COMPLETE 3+ VIEW COMPARISON:  Plain films left foot 02/12/2019. FINDINGS: Large open wound along the medial margin of the great toe has worsened. Extensive bony destructive change is now seen throughout the distal phalanx and approximately the distal 2 cm of the proximal phalanx. The distal phalanx is dorsally dislocated. Soft tissues are markedly swollen throughout the foot and worse than on the prior study. Postoperative change of fixation of ankle fractures noted. IMPRESSION: Markedly worsened appearance of the great toe with findings consistent with osteomyelitis throughout the distal phalanx and at least the distal 2 cm of the proximal phalanx. The distal phalanx is dorsally dislocated. Soft tissue wound along the medial aspect of the great toe has markedly worsened since the prior examination. Diffuse soft tissue swelling about the foot compatible with dependent change and/or cellulitis is much worse than on the prior study. Electronically Signed   By: Inge Rise M.D.   On: 03/21/2019 18:48    ASSESSMENT AND PLAN:   *Acute sepsis  Likely secondary to left great toe osteomyelitis/acute on chronic diabetic foot wound  Continue sepsis protocol, empiric vancomycin/cefepime, follow-up on cultures   *Acute left great toe osteomyelitis/acute on chronic diabetic foot wound with associated cellulitis  4 left great toe amputation by Dr. Vickki Muff possibly later today, antibiotics per above, follow-up on cultures  *Acute hyperglycemia with probable poorly controlled diabetes mellitus type 2 Increase sliding scale insulin, increase Lantus to 45 units subcu twice  daily, check hemoglobin A1c determine level control, Accu-Cheks per routine  *Acute hypotension Most likely secondary to sepsis, compounded by diuretic and antihypertensive use We will place hold parameters on beta-blocker therapy/diuretics, IV fluids for rehydration gently as patient does have history of heart failure  *Chronic combined systolic and diastolic CHF without exacerbation  Stable  Continue beta-blocker therapy and diuretics with hold parameters as stated above, strict I&O monitoring, daily weights Most recent echocardiogram noted for ejection fraction of 45-50%   *Chronic Bipolar disorder, unspecified continue home meds  DVT prophylaxis with heparin subcu Disposition Home in 1 to 2 days barring any complications  All the records are reviewed and case discussed with Care Management/Social Workerr. Management plans discussed with the patient, family and they are in agreement.  CODE STATUS: full  TOTAL TIME TAKING CARE OF THIS PATIENT: 35 minutes.     POSSIBLE D/C IN 1-2 DAYS, DEPENDING ON CLINICAL CONDITION.   Avel [name] Mehar Sagen M.D on 03/22/2019   Between 7am to 6pm - Pager - [phone]  After 6pm go to [email or web address] - password [email or web address] [facility]  Office  [phone]  CC: Primary care physician; Lacie Draft, NP  Note: This dictation was prepared with Dragon dictation along with smaller phrase technology. Any transcriptional errors that result from this process are unintentional.

## 2019-03-23 ENCOUNTER — Encounter
Admission: EM | Disposition: A | Discharge status: Home Health | Payer: Self-pay | Source: Home / Self Care | Attending: Specialist

## 2019-03-23 ENCOUNTER — Inpatient Hospital Stay: Payer: Medicaid Other | Admitting: Anesthesiology

## 2019-03-23 ENCOUNTER — Encounter: Payer: Self-pay | Admitting: Anesthesiology

## 2019-03-23 ENCOUNTER — Inpatient Hospital Stay: Admit: 2019-03-23 | Payer: Medicaid Other

## 2019-03-23 HISTORY — PX: AMPUTATION TOE: SHX6595

## 2019-03-23 LAB — BASIC METABOLIC PANEL
Anion gap: 6 (ref 5–15)
BUN: 11 mg/dL (ref 6–20)
CO2: 25 mmol/L (ref 22–32)
Calcium: 8.1 mg/dL — ABNORMAL LOW (ref 8.9–10.3)
Chloride: 106 mmol/L (ref 98–111)
Creatinine, Ser: 1.18 mg/dL — ABNORMAL HIGH (ref 0.44–1.00)
GFR calc Af Amer: >60 mL/min (ref >60)
GFR calc non Af Amer: 56 mL/min — ABNORMAL LOW (ref >60)
Glucose, Bld: 305 mg/dL — ABNORMAL HIGH (ref 70–99)
Potassium: 3.7 mmol/L (ref 3.5–5.1)
Sodium: 137 mmol/L (ref 135–145)

## 2019-03-23 LAB — GLUCOSE, CAPILLARY
Glucose-Capillary: 268 mg/dL — ABNORMAL HIGH (ref 70–99)
Glucose-Capillary: 140 mg/dL — ABNORMAL HIGH (ref 70–99)
Glucose-Capillary: 85 mg/dL (ref 70–99)
Glucose-Capillary: 259 mg/dL — ABNORMAL HIGH (ref 70–99)
Glucose-Capillary: 262 mg/dL — ABNORMAL HIGH (ref 70–99)

## 2019-03-23 LAB — HIV ANTIBODY (ROUTINE TESTING W REFLEX): HIV Screen 4th Generation wRfx: NONREACTIVE

## 2019-03-23 SURGERY — AMPUTATION, TOE
Anesthesia: General | Laterality: Left

## 2019-03-23 MED ORDER — DEXAMETHASONE SODIUM PHOSPHATE 10 MG/ML IJ SOLN
INTRAMUSCULAR | Status: DC | PRN
  Administered 2019-03-23: 5 mg via INTRAVENOUS

## 2019-03-23 MED ORDER — GLYCOPYRROLATE 0.2 MG/ML IJ SOLN
INTRAMUSCULAR | Status: AC
  Filled 2019-03-23: qty 1

## 2019-03-23 MED ORDER — MIDAZOLAM HCL 5 MG/5ML IJ SOLN
INTRAMUSCULAR | Status: DC | PRN
  Administered 2019-03-23: 2 mg via INTRAVENOUS

## 2019-03-23 MED ORDER — GLYCOPYRROLATE 0.2 MG/ML IJ SOLN
INTRAMUSCULAR | Status: DC | PRN
  Administered 2019-03-23: 0.2 mg via INTRAVENOUS

## 2019-03-23 MED ORDER — PHENYLEPHRINE HCL (PRESSORS) 10 MG/ML IV SOLN
INTRAVENOUS | Status: AC
  Filled 2019-03-23: qty 1

## 2019-03-23 MED ORDER — PROPOFOL 10 MG/ML IV BOLUS
INTRAVENOUS | Status: AC
  Filled 2019-03-23: qty 20

## 2019-03-23 MED ORDER — HYDROCODONE-ACETAMINOPHEN 5-325 MG PO TABS
1.0000 | ORAL_TABLET | ORAL | Status: DC | PRN
  Administered 2019-03-23 – 2019-03-26 (×7): 1 via ORAL
  Filled 2019-03-23 (×7): qty 1

## 2019-03-23 MED ORDER — DEXAMETHASONE SODIUM PHOSPHATE 10 MG/ML IJ SOLN
INTRAMUSCULAR | Status: AC
  Filled 2019-03-23: qty 1

## 2019-03-23 MED ORDER — LIDOCAINE HCL (PF) 2 % IJ SOLN
INTRAMUSCULAR | Status: AC
  Filled 2019-03-23: qty 10

## 2019-03-23 MED ORDER — ONDANSETRON HCL 4 MG/2ML IJ SOLN
INTRAMUSCULAR | Status: AC
  Filled 2019-03-23: qty 2

## 2019-03-23 MED ORDER — FENTANYL CITRATE (PF) 100 MCG/2ML IJ SOLN
INTRAMUSCULAR | Status: DC | PRN
  Administered 2019-03-23: 25 ug via INTRAVENOUS
  Administered 2019-03-23: 50 ug via INTRAVENOUS
  Administered 2019-03-23: 25 ug via INTRAVENOUS

## 2019-03-23 MED ORDER — ONDANSETRON HCL 4 MG/2ML IJ SOLN
INTRAMUSCULAR | Status: DC | PRN
  Administered 2019-03-23: 4 mg via INTRAVENOUS

## 2019-03-23 MED ORDER — FENTANYL CITRATE (PF) 100 MCG/2ML IJ SOLN: 25.0000 ug | INTRAMUSCULAR | Status: DC | PRN

## 2019-03-23 MED ORDER — LIDOCAINE HCL (PF) 2 % IJ SOLN
INTRAMUSCULAR | Status: DC | PRN
  Administered 2019-03-23: 50 mg

## 2019-03-23 MED ORDER — MIDAZOLAM HCL 2 MG/2ML IJ SOLN
INTRAMUSCULAR | Status: AC
  Filled 2019-03-23: qty 2

## 2019-03-23 MED ORDER — BUPIVACAINE HCL 0.5 % IJ SOLN
INTRAMUSCULAR | Status: DC | PRN
  Administered 2019-03-23: 8 mL

## 2019-03-23 MED ORDER — PROPOFOL 10 MG/ML IV BOLUS
INTRAVENOUS | Status: DC | PRN
  Administered 2019-03-23: 150 mg via INTRAVENOUS

## 2019-03-23 MED ORDER — BUPIVACAINE HCL (PF) 0.5 % IJ SOLN
INTRAMUSCULAR | Status: AC
  Filled 2019-03-23: qty 30

## 2019-03-23 MED ORDER — PROMETHAZINE HCL 25 MG/ML IJ SOLN: 6.2500 mg | INTRAMUSCULAR | Status: DC | PRN

## 2019-03-23 MED ORDER — EPHEDRINE SULFATE 50 MG/ML IJ SOLN
INTRAMUSCULAR | Status: DC | PRN
  Administered 2019-03-23: 10 mg via INTRAVENOUS

## 2019-03-23 MED ORDER — FENTANYL CITRATE (PF) 100 MCG/2ML IJ SOLN
INTRAMUSCULAR | Status: AC
  Filled 2019-03-23: qty 2

## 2019-03-23 MED ORDER — LIDOCAINE HCL (PF) 1 % IJ SOLN
INTRAMUSCULAR | Status: AC
  Filled 2019-03-23: qty 30

## 2019-03-23 MED ORDER — PHENYLEPHRINE HCL (PRESSORS) 10 MG/ML IV SOLN
INTRAVENOUS | Status: DC | PRN
  Administered 2019-03-23 (×4): 200 ug via INTRAVENOUS

## 2019-03-23 SURGICAL SUPPLY — 40 items
"PENCIL ELECTRO HAND CTR " (MISCELLANEOUS) ×1 IMPLANT
BANDAGE ELASTIC 4 LF NS (GAUZE/BANDAGES/DRESSINGS) ×2 IMPLANT
BLADE MED AGGRESSIVE (BLADE) ×2 IMPLANT
BLADE SURG 15 STRL LF DISP TIS (BLADE) ×4 IMPLANT
BLADE SURG 15 STRL SS (BLADE) ×4
BNDG CMPR MED 5X4 ELC HKLP NS (GAUZE/BANDAGES/DRESSINGS) ×1
BNDG CONFORM 3 STRL LF (GAUZE/BANDAGES/DRESSINGS) ×2 IMPLANT
BNDG ESMARK 4X12 TAN STRL LF (GAUZE/BANDAGES/DRESSINGS) ×2 IMPLANT
BNDG GAUZE 4.5X4.1 6PLY STRL (MISCELLANEOUS) ×2 IMPLANT
CANISTER SUCT 1200ML W/VALVE (MISCELLANEOUS) ×2 IMPLANT
COVER WAND RF STERILE (DRAPES) ×2 IMPLANT
CUFF TOURN SGL QUICK 12 (TOURNIQUET CUFF) IMPLANT
CUFF TOURN SGL QUICK 18X4 (TOURNIQUET CUFF) ×1 IMPLANT
DRAPE FLUOR MINI C-ARM 54X84 (DRAPES) ×2 IMPLANT
DURAPREP 26ML APPLICATOR (WOUND CARE) ×2 IMPLANT
ELECT REM PT RETURN 9FT ADLT (ELECTROSURGICAL) ×2
ELECTRODE REM PT RTRN 9FT ADLT (ELECTROSURGICAL) ×1 IMPLANT
GAUZE SPONGE 4X4 12PLY STRL (GAUZE/BANDAGES/DRESSINGS) ×2 IMPLANT
GAUZE XEROFORM 1X8 LF (GAUZE/BANDAGES/DRESSINGS) ×2 IMPLANT
GLOVE BIO SURGEON STRL SZ8 (GLOVE) ×2 IMPLANT
GLOVE INDICATOR 8.0 STRL GRN (GLOVE) ×4 IMPLANT
GOWN STRL REUS W/ TWL LRG LVL3 (GOWN DISPOSABLE) ×2 IMPLANT
GOWN STRL REUS W/TWL LRG LVL3 (GOWN DISPOSABLE) ×4
KIT TURNOVER KIT A (KITS) ×2 IMPLANT
LABEL OR SOLS (LABEL) ×2 IMPLANT
NDL HYPO 25X1 1.5 SAFETY (NEEDLE) ×3 IMPLANT
NDL SAFETY ECLIPSE 18X1.5 (NEEDLE) ×1 IMPLANT
NEEDLE HYPO 18GX1.5 SHARP (NEEDLE) ×2
NEEDLE HYPO 25X1 1.5 SAFETY (NEEDLE) ×2 IMPLANT
NS IRRIG 500ML POUR BTL (IV SOLUTION) ×2 IMPLANT
PACK EXTREMITY ARMC (MISCELLANEOUS) ×2 IMPLANT
PENCIL ELECTRO HAND CTR (MISCELLANEOUS) ×1 IMPLANT
RASP SM TEAR CROSS CUT (RASP) ×2 IMPLANT
SPONGE LAP 18X18 RF (DISPOSABLE) ×1 IMPLANT
STOCKINETTE STRL 6IN 960660 (GAUZE/BANDAGES/DRESSINGS) ×2 IMPLANT
SUT ETH BLK MONO 3 0 FS 1 12/B (SUTURE) ×2 IMPLANT
SUT ETHILON 5 0 PS 2 18 (SUTURE) ×2 IMPLANT
SUT VIC AB 4-0 FS2 27 (SUTURE) ×2 IMPLANT
SWAB CULTURE AMIES ANAERIB BLU (MISCELLANEOUS) ×2 IMPLANT
SYR 10ML LL (SYRINGE) ×2 IMPLANT

## 2019-03-23 NOTE — Op Note (Signed)
Operative note   Surgeon: Dr. [name] Patricia, DPM.    Assistant: None    Preop diagnosis: Osteomyelitis with gangrene left great toe and first metatarsal head    Postop diagnosis: Same    Procedure:   1.  First ray amputation left foot           EBL: 20 cc    Anesthesia:general delivered by anesthesia team.  I delivered 9 cc of 0.5% Marcaine plain around the base of the first metatarsal preoperatively    Hemostasis: Ankle tourniquet at 2 and 25 mils mercury pressure for 12 minutes    Specimen: 1.  Amputated great toe to pathology 2.  Bone culture from the distal phalanx of the great toe 3.  Metatarsal head with separate proximal section marked for evaluation of clear margins    Complications: None    Operative indications: Chronic infection with progression and bone secondary to diabetic ulceration on the left great toe    Procedure:  Patient was brought into the OR and placed on the operating table in thesupine position. After anesthesia was obtained theleft lower extremity was prepped and draped in usual sterile fashion.  Operative Report: This time attention was directed to the left great toe.  2 semielliptical incisions were made starting medial to the first metatarsal head and distal shaft was carried down to bone a dorsal arm was created and a plantar arm was created and met again on the plantar lateral side of the great toe base.  Skin was saved on the dorsal and plantar aspects for closure.  The toe was then disarticulated at the level of the bone.  Extensor and flexor tendons were released and the toe was removed in toto.  Culture was removed from the bone of the digit.  This point the metatarsal head was dissected and then resected with power equipment approximately 1.5 cm proximal to the articular cartilage.  This did include a segment of bone that are marked which was more proximal to see if he had clear margins.  Next the sesamoid apparatus blue tibiofibular sesamoids  were removed.  This point the area was inspected no evidence of infected tissues were noted.  The area was then copiously irrigated.  4-0 Vicryl was used in simple erupted fashion to close the capsular tissue over the bone.  Skin was then closed with 3-0 nylon simple interrupted sutures.  A packing was put into the wound medially.  This will be removed tomorrow.  A sterile dressing was placed across the area consisting of Xeroform gauze 4 x 4's conformer Kerlix and ABD pad and Ace wrap.    Patient tolerated the procedure and anesthesia well.  Was transported from the OR to the PACU with all vital signs stable and vascular status intact. To be discharged per routine protocol.  Will follow up in approximately 1 week in the outpatient clinic.

## 2019-03-23 NOTE — Progress Notes (Signed)
[facility] NAME: [name]    MR#:  [id number]  DATE OF BIRTH:  12-15-1975  SUBJECTIVE:   Patient here due to left toe osteomyelitis.  Status post first ray amputation of left foot.  Pt. Was a bit lethargic post-operatively.  No other acute events overnight.   REVIEW OF SYSTEMS:    Review of Systems  Constitutional: Negative for chills and fever.  HENT: Negative for congestion and tinnitus.   Eyes: Negative for blurred vision and double vision.  Respiratory: Negative for cough, shortness of breath and wheezing.   Cardiovascular: Negative for chest pain, orthopnea and PND.  Gastrointestinal: Negative for abdominal pain, diarrhea, nausea and vomiting.  Genitourinary: Negative for dysuria and hematuria.  Neurological: Negative for dizziness, sensory change and focal weakness.  All other systems reviewed and are negative.   Nutrition: Carb modified Tolerating Diet: Yes Tolerating PT: Await Eval.    DRUG ALLERGIES:   Allergies  Allergen Reactions  . Albumin (Human)     unknown  . Erythromycin Rash  . Midecamycin Rash    VITALS:  Blood pressure 100/66, pulse 94, temperature 97.9 F (36.6 C), temperature source Oral, resp. rate 18, height 5\' 9"  (1.753 m), weight 95.3 kg, SpO2 98 %.  PHYSICAL EXAMINATION:   Physical Exam  GENERAL:  43 y.o.-year-old patient lying in bed in no acute distress.  EYES: Pupils equal, round, reactive to light and accommodation. No scleral icterus. Extraocular muscles intact.  HEENT: Head atraumatic, normocephalic. Oropharynx and nasopharynx clear.  NECK:  Supple, no jugular venous distention. No thyroid enlargement, no tenderness.  LUNGS: Normal breath sounds bilaterally, no wheezing, rales, rhonchi. No use of accessory muscles of respiration.  CARDIOVASCULAR: S1, S2 normal. No murmurs, rubs, or gallops.  ABDOMEN: Soft, nontender, nondistended. Bowel sounds present. No organomegaly or  mass.  EXTREMITIES: No cyanosis, clubbing or edema b/l. LLE dressing from surgery today.    NEUROLOGIC: Cranial nerves II through XII are intact. No focal Motor or sensory deficits b/l.   PSYCHIATRIC: The patient is alert and oriented x 3.  SKIN: No obvious rash, lesion, or ulcer.  LLE dressing in place from recent surgery today.    LABORATORY PANEL:   CBC Recent Labs  Lab 03/22/19 0446  WBC 5.9  HGB 10.7*  HCT 32.1*  PLT 270   ------------------------------------------------------------------------------------------------------------------  Chemistries  Recent Labs  Lab 03/23/19 0404  NA 137  K 3.7  CL 106  CO2 25  GLUCOSE 305*  BUN 11  CREATININE 1.18*  CALCIUM 8.1*   ------------------------------------------------------------------------------------------------------------------  Cardiac Enzymes Recent Labs  Lab 03/21/19 1826  TROPONINI <0.03   ------------------------------------------------------------------------------------------------------------------  RADIOLOGY:  Ct Head Wo Contrast  Result Date: 03/21/2019 CLINICAL DATA:  Left-sided weakness, dizziness EXAM: CT HEAD WITHOUT CONTRAST TECHNIQUE: Contiguous axial images were obtained from the base of the skull through the vertex without intravenous contrast. COMPARISON:  None. FINDINGS: Brain: No evidence of acute infarction, hemorrhage, hydrocephalus, extra-axial collection or mass lesion/mass effect. Vascular: No hyperdense vessel or unexpected calcification. Skull: No osseous abnormality. Sinuses/Orbits: Visualized paranasal sinuses are clear. Visualized mastoid sinuses are clear. Visualized orbits demonstrate no focal abnormality. Other: None IMPRESSION: No acute intracranial pathology. Electronically Signed   By: Kathreen Devoid   On: 03/21/2019 18:41   Mr Foot Left Wo Contrast  Result Date: 03/22/2019 CLINICAL DATA:  Foot swelling with wound involving the left great toe. EXAM: MRI OF THE LEFT FOOT WITHOUT  CONTRAST TECHNIQUE: Multiplanar,  multisequence MR imaging of the bilateral was performed. No intravenous contrast was administered. COMPARISON:  None. FINDINGS: Bones/Joint/Cartilage Soft tissue wound involving the plantar aspect of great toe. Cortical destruction along the plantar aspect of the first distal phalanx and head of the first proximal phalanx with severe T1 hypointensity throughout the first proximal and distal phalanx and associated marrow edema most consistent with osteomyelitis. Mild marrow edema in the first metatarsal head along the medial aspect without cortical destruction. Small first MTP joint effusion. No other marrow signal abnormality. Mild degenerative changes of the medial hallux sesamoid-metatarsal head joint. No fracture or dislocation. Normal alignment. Mild osteoarthritis of the navicular-medial cuneiform. Moderate osteoarthritis of the talonavicular joint with a small joint effusion. Ligaments Collateral ligaments are intact.  Lisfranc ligament is intact. Muscles and Tendons Flexor, peroneal and extensor compartment tendons are intact. T2 hyperintense signal throughout the plantar musculature likely neurogenic. Soft tissue No fluid collection or hematoma. No soft tissue mass. Severe soft tissue edema along the dorsal aspect of the foot and ankle. IMPRESSION: 1. Soft tissue ulcer along the plantar aspect of the great toe. Severe osteomyelitis throughout the first proximal and first distal phalanx. 2. Mild marrow edema in the first metatarsal head along the medial aspect without cortical destruction. Small first MTP joint effusion. These may reflect reactive changes secondary to adjacent inflammation versus early septic arthritis. Electronically Signed   By: Kathreen Devoid   On: 03/22/2019 11:49   Dg Chest Portable 1 View  Result Date: 03/21/2019 CLINICAL DATA:  Shortness of breath for 2-3 weeks. EXAM: PORTABLE CHEST 1 VIEW COMPARISON:  11/15/2010 FINDINGS: The cardiac silhouette,  mediastinal and hilar contours are within normal limits and stable. The lungs are clear. No pleural effusions. The bony thorax is intact. IMPRESSION: No acute cardiopulmonary findings. Electronically Signed   By: Marijo Sanes M.D.   On: 03/21/2019 18:49   Dg Foot Complete Left  Result Date: 03/21/2019 CLINICAL DATA:  Chronic open wound on the left great toe. Pain and swelling. Subsequent encounter. No known injury. EXAM: LEFT FOOT - COMPLETE 3+ VIEW COMPARISON:  Plain films left foot 02/12/2019. FINDINGS: Large open wound along the medial margin of the great toe has worsened. Extensive bony destructive change is now seen throughout the distal phalanx and approximately the distal 2 cm of the proximal phalanx. The distal phalanx is dorsally dislocated. Soft tissues are markedly swollen throughout the foot and worse than on the prior study. Postoperative change of fixation of ankle fractures noted. IMPRESSION: Markedly worsened appearance of the great toe with findings consistent with osteomyelitis throughout the distal phalanx and at least the distal 2 cm of the proximal phalanx. The distal phalanx is dorsally dislocated. Soft tissue wound along the medial aspect of the great toe has markedly worsened since the prior examination. Diffuse soft tissue swelling about the foot compatible with dependent change and/or cellulitis is much worse than on the prior study. Electronically Signed   By: Inge Rise M.D.   On: 03/21/2019 18:48     ASSESSMENT AND PLAN:   43 year old female with past medical history of diabetes, hypertension, substance abuse, chronic combined systolic diastolic CHF, history of seizures who presented to the hospital due to left lower extremity ulcer.  1.  Left lower extremity ulcer- suspicious for underlying osteomyelitis. - Seen by podiatry and status post left toe amputation today. - Continue empiric antibiotics with Ancef, vancomycin. -Seen by infectious disease and continue  current antibiotics.  2.  MSSA bacteremia- secondary to the  osteomyelitis as mentioned. - Continue IV Ancef for now.  Patient likely will need a TEE.  TTE currently pending. - repeat blood cultures drawn yesterday and currently pending.  3.  Diabetes type 2 with neuropathy-continue Lantus, sliding scale insulin. - Continue carb controlled diet.  4.  GERD-continue Protonix.  5.  Diabetic neuropathy-continue gabapentin.  6.  Hyperlipidemia-continue atorvastatin.  7. HTN - cont. Metoprolol.    8. Depression - cont. Effexor.   All the records are reviewed and case discussed with Care Management/Social Worker. Management plans discussed with the patient, family and they are in agreement.  CODE STATUS: Full code  DVT Prophylaxis: Lovenox  TOTAL TIME TAKING CARE OF THIS PATIENT: 30 minutes.   POSSIBLE D/C IN 2-3 DAYS, DEPENDING ON CLINICAL CONDITION.   Henreitta Leber M.D on 03/23/2019 at 2:25 PM  Between 7am to 6pm - Pager - [phone]  After 6pm go to [email or web address] - Proofreader  [facility]  Office  [phone]  CC: Primary care physician; Lacie Draft, NP

## 2019-03-23 NOTE — Anesthesia Procedure Notes (Signed)
Procedure Name: LMA Insertion Performed by: Satara Virella, CRNA Pre-anesthesia Checklist: Patient identified, Patient being monitored, Timeout performed, Emergency Drugs available and Suction available Patient Re-evaluated:Patient Re-evaluated prior to induction Oxygen Delivery Method: Circle system utilized Preoxygenation: Pre-oxygenation with 100% oxygen Induction Type: IV induction Ventilation: Mask ventilation without difficulty LMA: LMA inserted LMA Size: 3.5 Tube type: Oral Number of attempts: 1 Placement Confirmation: positive ETCO2 and breath sounds checked- equal and bilateral Tube secured with: Tape Dental Injury: Teeth and Oropharynx as per pre-operative assessment        

## 2019-03-23 NOTE — Anesthesia Preprocedure Evaluation (Signed)
Anesthesia Evaluation  Patient identified by MRN, date of birth, ID band Patient awake    Reviewed: Allergy & Precautions, H&P , NPO status , Patient's Chart, lab work & pertinent test results, reviewed documented beta blocker date and time   Airway Mallampati: II  TM Distance: >3 FB Neck ROM: full    Dental  (+) Edentulous Upper, Edentulous Lower   Pulmonary shortness of breath and with exertion, neg sleep apnea, COPD, neg recent URI, Current Smoker,           Cardiovascular Exercise Tolerance: Good hypertension, (-) angina+CHF  (-) Past MI and (-) Cardiac Stents (-) dysrhythmias (-) Valvular Problems/Murmurs     Neuro/Psych Seizures -, Well Controlled,  PSYCHIATRIC DISORDERS Anxiety Bipolar Disorder  Neuromuscular disease    GI/Hepatic Neg liver ROS, GERD  ,  Endo/Other  diabetes  Renal/GU negative Renal ROS  negative genitourinary   Musculoskeletal   Abdominal   Peds  Hematology negative hematology ROS (+)   Anesthesia Other Findings Past Medical History: No date: Chronic combined systolic and diastolic CHF (congestive  heart failure) ([place])     Comment:  a. ECHO 04/23/14 LV EF: 45% - 50%,  mild LVH, diffuse               hypokinesis. LV diastolic dysfunction, high ventricular               filling pressure. Mild LA dilation. No date: Cocaine abuse (HCC) No date: Diabetes mellitus without complication (HCC) No date: Hypertension No date: Seizures (HCC)   Reproductive/Obstetrics negative OB ROS                             Anesthesia Physical Anesthesia Plan  ASA: III  Anesthesia Plan: General   Post-op Pain Management:    Induction: Intravenous  PONV Risk Score and Plan: 2 and Ondansetron, Dexamethasone, Midazolam, Promethazine and Treatment may vary due to age or medical condition  Airway Management Planned: LMA  Additional Equipment:   Intra-op Plan:   Post-operative  Plan: Extubation in OR  Informed Consent: I have reviewed the patients History and Physical, chart, labs and discussed the procedure including the risks, benefits and alternatives for the proposed anesthesia with the patient or authorized representative who has indicated his/her understanding and acceptance.     Dental Advisory Given  Plan Discussed with: Anesthesiologist, CRNA and Surgeon  Anesthesia Plan Comments:         Anesthesia Quick Evaluation

## 2019-03-23 NOTE — Transfer of Care (Signed)
Immediate Anesthesia Transfer of Care Note  Patient: [name]  Procedure(s) Performed: AMPUTATION TOE GREAT LEFT TOE (Left )  Patient Location: PACU  Anesthesia Type:General  Level of Consciousness: sedated  Airway & Oxygen Therapy: Patient Spontanous Breathing and Patient connected to face mask oxygen  Post-op Assessment: Report given to RN and Post -op Vital signs reviewed and stable  Post vital signs: Reviewed  Last Vitals:  Vitals Value Taken Time  BP 88/75 03/23/2019  9:09 AM  Temp 36.2 C 03/23/2019  9:09 AM  Pulse 89 03/23/2019  9:09 AM  Resp 9 03/23/2019  9:09 AM  SpO2 100 % 03/23/2019  9:09 AM  Vitals shown include unvalidated device data.  Last Pain:  Vitals:   03/23/19 0736  TempSrc: Oral  PainSc:          Complications: No apparent anesthesia complications

## 2019-03-23 NOTE — Anesthesia Post-op Follow-up Note (Signed)
Anesthesia QCDR form completed.        

## 2019-03-23 NOTE — Anesthesia Postprocedure Evaluation (Signed)
Anesthesia Post Note  Patient: [name]  Procedure(s) Performed: AMPUTATION TOE GREAT LEFT TOE (Left )  Patient location during evaluation: PACU Anesthesia Type: General Level of consciousness: awake and alert Pain management: pain level controlled Vital Signs Assessment: post-procedure vital signs reviewed and stable Respiratory status: spontaneous breathing, nonlabored ventilation, respiratory function stable and patient connected to nasal cannula oxygen Cardiovascular status: blood pressure returned to baseline and stable Postop Assessment: no apparent nausea or vomiting Anesthetic complications: no     Last Vitals:  Vitals:   03/23/19 0939 03/23/19 0954  BP: 105/85 109/88  Pulse: 88 91  Resp: 11 12  Temp:  36.4 C  SpO2: 100% 99%    Last Pain:  Vitals:   03/23/19 0939  TempSrc:   PainSc: Asleep                 Martha Clan

## 2019-03-23 NOTE — H&P (Signed)
H and P has been reviewed and no changes are noted.  

## 2019-03-24 ENCOUNTER — Encounter: Payer: Self-pay | Admitting: Podiatry

## 2019-03-24 ENCOUNTER — Inpatient Hospital Stay (HOSPITAL_COMMUNITY)
Admit: 2019-03-24 | Discharge: 2019-03-24 | Disposition: A | Discharge status: Home / Self Care | Payer: Medicaid Other | Attending: Infectious Diseases | Admitting: Infectious Diseases

## 2019-03-24 DIAGNOSIS — I371 Nonrheumatic pulmonary valve insufficiency: Secondary | ICD-10-CM

## 2019-03-24 LAB — GLUCOSE, CAPILLARY
Glucose-Capillary: 274 mg/dL — ABNORMAL HIGH (ref 70–99)
Glucose-Capillary: 81 mg/dL (ref 70–99)
Glucose-Capillary: 114 mg/dL — ABNORMAL HIGH (ref 70–99)
Glucose-Capillary: 94 mg/dL (ref 70–99)

## 2019-03-24 LAB — CBC
HCT: 31.5 % — ABNORMAL LOW (ref 36.0–46.0)
Hemoglobin: 10.6 g/dL — ABNORMAL LOW (ref 12.0–15.0)
MCH: 29.9 pg (ref 26.0–34.0)
MCHC: 33.7 g/dL (ref 30.0–36.0)
MCV: 89 fL (ref 80.0–100.0)
Platelets: 303 10*3/uL (ref 150–400)
RBC: 3.54 MIL/uL — ABNORMAL LOW (ref 3.87–5.11)
RDW: 13.2 % (ref 11.5–15.5)
WBC: 9.7 10*3/uL (ref 4.0–10.5)
nRBC: 0 % (ref 0.0–0.2)

## 2019-03-24 LAB — CREATININE, SERUM
Creatinine, Ser: 1.11 mg/dL — ABNORMAL HIGH (ref 0.44–1.00)
GFR calc Af Amer: >60 mL/min (ref >60)
GFR calc non Af Amer: >60 mL/min (ref >60)

## 2019-03-24 LAB — CULTURE, BLOOD (ROUTINE X 2): Special Requests: ADEQUATE

## 2019-03-24 LAB — ECHOCARDIOGRAM COMPLETE
Height: 69 in
Weight: 3360 oz

## 2019-03-24 NOTE — Progress Notes (Signed)
PT Cancellation Note  Patient Details Name: NANNIE STARZYK MRN: [id number] DOB: 10-16-1976   Cancelled Treatment:    Reason Eval/Treat Not Completed: Other (comment);Patient at procedure or test/unavailable(Patient not in room, at procedure. Will attempt again at later time/date. )  Janna Arch, PT, DPT   03/24/2019, 2:19 PM

## 2019-03-24 NOTE — Progress Notes (Signed)
[facility]                                                      Patient Demographics  [name], is a 43 y.o. female   MRN: [id number]   DOB - 10-16-1976  Admit Date - 03/21/2019    Outpatient Primary MD for the patient is Lacie Draft, NP  Consult requested in the Hospital by Henreitta Leber, MD, On 03/24/2019   With History of -  Past Medical History:  Diagnosis Date  . Chronic combined systolic and diastolic CHF (congestive heart failure) ([place])    a. ECHO 04/23/14 LV EF: 45% - 50%,  mild LVH, diffuse hypokinesis. LV diastolic dysfunction, high ventricular filling pressure. Mild LA dilation.  . Cocaine abuse ([place])   . Diabetes mellitus without complication ([place])   . Hypertension   . Seizures ([place])       Past Surgical History:  Procedure Laterality Date  . AMPUTATION TOE Left 03/23/2019   Procedure: AMPUTATION TOE GREAT LEFT TOE;  Surgeon: [name] Patricia, DPM;  Location: [facility] ORS;  Service: Podiatry;  Laterality: Left;  . CHOLECYSTECTOMY  2005  . LOWER EXTREMITY ANGIOGRAPHY Left 03/12/2019   Procedure: LOWER EXTREMITY ANGIOGRAPHY;  Surgeon: Katha Cabal, MD;  Location: [facility];  Service: Cardiovascular;  Laterality: Left;  . TUBAL LIGATION      in for   Chief Complaint  Patient presents with  . Claudication  . Shortness of Breath     HPI  [name]  is a 43 y.o. female, 1 day postop following first ray amputation secondary to osteomyelitis and infection to the left foot    Review of Systems    In addition to the HPI above,  No Fever-chills, No Headache, No changes with Vision or hearing, No problems swallowing food or Liquids, No Chest pain, Cough or Shortness of Breath, No Abdominal pain, No Nausea or Vommitting, Bowel movements are regular, No Blood in stool or Urine, No dysuria, No new skin  rashes or bruises, No new joints pains-aches,  No new weakness, tingling, numbness in any extremity, No recent weight gain or loss, No polyuria, polydypsia or polyphagia, No significant Mental Stressors.  A full 10 point Review of Systems was done, except as stated above, all other Review of Systems were negative.   Social History Social History   Tobacco Use  . Smoking status: Current Every Day Smoker    Packs/day: 1.00    Years: 28.00    Pack years: 28.00  . Smokeless tobacco: Never Used  Substance Use Topics  . Alcohol use: Not Currently    Alcohol/week: 0.0 standard drinks    Comment: Quit drinking 8 months ago (09/01/15)    Family History Family History  Problem Relation Age of Onset  . Hypertension Other   . Drug abuse Mother   . Seizures Neg Hx     Prior to Admission medications   Medication Sig Start Date End Date Taking? Authorizing Provider  amitriptyline (ELAVIL) 150 MG tablet Take 150 mg by mouth at bedtime.   Yes [provider]  atorvastatin (LIPITOR) 20 MG tablet Take 20 mg by mouth at bedtime.    Yes [provider]  furosemide (LASIX) 20 MG tablet Take 20 mg by mouth 2 (two) times a day.  Yes [provider]  gabapentin (NEURONTIN) 600 MG tablet Take 1 tablet (600 mg total) by mouth 3 (three) times daily. 09/28/18  Yes Stover, Titorya, DPM  hydrOXYzine (ATARAX/VISTARIL) 50 MG tablet Take 50 mg by mouth 2 (two) times a day.  08/21/18  Yes [provider]  insulin aspart (NOVOLOG) 100 UNIT/ML injection Inject 4-14 Units into the skin 3 (three) times daily as needed for high blood sugar.   Yes [provider]  insulin glargine (LANTUS) 100 UNIT/ML injection Inject 40 Units into the skin 2 (two) times a day.   Yes [provider]  Insulin Syringes, Disposable, U-100 0.3 ML MISC Inject 24 Units into the skin at bedtime. 05/14/14  Yes Love, Ivan Anchors, PA-C  meloxicam (MOBIC) 7.5 MG tablet Take 7.5 mg by mouth  daily as needed for pain.  08/21/18  Yes [provider]  metFORMIN (GLUCOPHAGE) 1000 MG tablet Take 1,000 mg by mouth 2 (two) times daily with a meal.   Yes [provider]  metoprolol tartrate (LOPRESSOR) 50 MG tablet Take 50 mg by mouth 2 (two) times daily.   Yes [provider]  mirtazapine (REMERON) 15 MG tablet Take 7.5-15 mg by mouth at bedtime. 03/13/19  Yes [provider]  pantoprazole (PROTONIX) 20 MG tablet Take 20 mg by mouth daily.  08/21/18  Yes [provider]  prazosin (MINIPRESS) 5 MG capsule Take 5 mg by mouth at bedtime.   Yes [provider]  promethazine (PHENERGAN) 12.5 MG tablet Take 12.5 mg by mouth 2 (two) times daily as needed for nausea or vomiting.  08/21/18  Yes [provider]  QUEtiapine (SEROQUEL) 400 MG tablet Take 800 mg by mouth at bedtime.   Yes [provider]  SIVEXTRO 200 MG TABS Take 1 tablet by mouth daily. Patient taking differently: Take 1 tablet by mouth at bedtime.  02/28/19  Yes Powers, Evern Core, MD  venlafaxine XR (EFFEXOR-XR) 150 MG 24 hr capsule Take 300 mg by mouth daily.  09/14/18  Yes [provider]    Anti-infectives (From admission, onward)   Start     Dose/Rate Route Frequency Ordered Stop   03/23/19 0000  metroNIDAZOLE (FLAGYL) IVPB 500 mg     500 mg 100 mL/hr over 60 Minutes Intravenous Every 8 hours 03/22/19 2303     03/22/19 2000  vancomycin (VANCOCIN) IVPB 1000 mg/200 mL premix  Status:  Discontinued     1,000 mg 200 mL/hr over 60 Minutes Intravenous Every 24 hours 03/21/19 2237 03/22/19 1235   03/22/19 2000  vancomycin (VANCOCIN) 1,500 mg in sodium chloride 0.9 % 500 mL IVPB     1,500 mg 250 mL/hr over 120 Minutes Intravenous Every 24 hours 03/22/19 1235     03/22/19 1700  ceFAZolin (ANCEF) IVPB 2g/100 mL premix     2 g 200 mL/hr over 30 Minutes Intravenous Every 8 hours 03/22/19 1556     03/22/19 1300  ceFEPIme (MAXIPIME) 2 g in sodium chloride 0.9 %  100 mL IVPB  Status:  Discontinued     2 g 200 mL/hr over 30 Minutes Intravenous Every 8 hours 03/22/19 1226 03/22/19 1556   03/22/19 0800  vancomycin (VANCOCIN) IVPB 1000 mg/200 mL premix  Status:  Discontinued     1,000 mg 200 mL/hr over 60 Minutes Intravenous Every 24 hours 03/21/19 2232 03/21/19 2237   03/21/19 2200  ceFEPIme (MAXIPIME) 2 g in sodium chloride 0.9 % 100 mL IVPB  Status:  Discontinued  2 g 200 mL/hr over 30 Minutes Intravenous Every 12 hours 03/21/19 2142 03/22/19 1226   03/21/19 2030  vancomycin (VANCOCIN) 1,500 mg in sodium chloride 0.9 % 500 mL IVPB     1,500 mg 250 mL/hr over 120 Minutes Intravenous  Once 03/21/19 2002 03/21/19 2226   03/21/19 2000  vancomycin (VANCOCIN) IVPB 1000 mg/200 mL premix  Status:  Discontinued     1,000 mg 200 mL/hr over 60 Minutes Intravenous  Once 03/21/19 1951 03/21/19 2002      Scheduled Meds: . amitriptyline  150 mg Oral QHS  . atorvastatin  20 mg Oral QHS  . enoxaparin (LOVENOX) injection  40 mg Subcutaneous Q24H  . furosemide  20 mg Oral BID  . gabapentin  100 mg Oral TID  . hydrOXYzine  50 mg Oral BID  . insulin aspart  0-20 Units Subcutaneous TID WC  . insulin aspart  0-5 Units Subcutaneous QHS  . insulin glargine  45 Units Subcutaneous BID  . metoprolol tartrate  50 mg Oral BID  . multivitamin with minerals  1 tablet Oral Daily  . pantoprazole  20 mg Oral Daily  . Ensure Max Protein  11 oz Oral BID  . QUEtiapine  800 mg Oral QHS  . venlafaxine XR  300 mg Oral Daily  . vitamin C  250 mg Oral BID   Continuous Infusions: . sodium chloride Stopped (03/24/19 0602)  . sodium chloride    .  ceFAZolin (ANCEF) IV Stopped (03/24/19 0617)  . metronidazole 500 mg (03/24/19 0815)  . vancomycin Stopped (03/23/19 2338)   PRN Meds:.sodium chloride, acetaminophen **OR** acetaminophen, HYDROcodone-acetaminophen, ondansetron **OR** ondansetron (ZOFRAN) IV  Allergies  Allergen Reactions  . Albumin (Human)     unknown  .  Erythromycin Rash  . Midecamycin Rash    Physical Exam  Vitals  Blood pressure 100/79, pulse 81, temperature 98.1 F (36.7 C), temperature source Oral, resp. rate 16, height 5\' 9"  (1.753 m), weight 95.3 kg, SpO2 98 %.  Lower Extremity exam: Examination today shows incision margin is in excellent shape is no redness or swelling around the region.  Redness to the foot has reduced quite a bit.  Still has swelling which is likely chronic.  Orthopedically she has some structural deformity to both feet with a very tight Achilles tendon chronic pes planovalgus with ambulation.  Data Review  CBC Recent Labs  Lab 03/21/19 1826 03/22/19 0446 03/24/19 0527  WBC 9.7 5.9 9.7  HGB 12.6 10.7* 10.6*  HCT 36.8 32.1* 31.5*  PLT 312 270 303  MCV 88.7 89.9 89.0  MCH 30.4 30.0 29.9  MCHC 34.2 33.3 33.7  RDW 13.0 13.2 13.2  LYMPHSABS 1.3  --   --   MONOABS 1.0  --   --   EOSABS 0.1  --   --   BASOSABS 0.0  --   --    ------------------------------------------------------------------------------------------------------------------   Assessment & Plan: Overall think her foot looks very good and the incision margin is stable in appearance and appears to be improved quite a bit with her infection.  Redressed the area today does not need any packing at this point.  I will order physical therapy also for her to be able to be trained for partial weightbearing on the left foot transfer only to bedside chair bedside toilet or wheelchair.  She has a walker in the room she utilizes on a regular basis.  Principal Problem:   Sepsis ([place]) Active Problems:   Bipolar disorder, unspecified ([place])  DM type 2 (diabetes mellitus, type 2)- poorly controlled   HTN (hypertension)   Chronic combined systolic and diastolic CHF (congestive heart failure) (HCC)   Toe osteomyelitis, left ([place])   Family Communication: Plan discussed with patient  [name] Patricia M.D on 03/24/2019 at 8:41 AM  Thank you for the  consult, we will follow the patient with you in the Hospital.

## 2019-03-24 NOTE — Plan of Care (Signed)
Patient is pleasant and cooperative with care. She is able to make her needs known. She has been up to Bedside commode. New IV started by team in rt upper arm/ shoulder.    Problem: Education: Goal: Knowledge of General Education information will improve Description Including pain rating scale, medication(s)/side effects and non-pharmacologic comfort measures Outcome: Progressing   Problem: Health Behavior/Discharge Planning: Goal: Ability to manage health-related needs will improve Outcome: Progressing   Problem: Clinical Measurements: Goal: Ability to maintain clinical measurements within normal limits will improve Outcome: Progressing Goal: Will remain free from infection Outcome: Progressing Goal: Diagnostic test results will improve Outcome: Progressing Goal: Respiratory complications will improve Outcome: Progressing Goal: Cardiovascular complication will be avoided Outcome: Progressing   Problem: Activity: Goal: Risk for activity intolerance will decrease Outcome: Progressing   Problem: Nutrition: Goal: Adequate nutrition will be maintained Outcome: Progressing   Problem: Coping: Goal: Level of anxiety will decrease Outcome: Progressing   Problem: Elimination: Goal: Will not experience complications related to bowel motility Outcome: Progressing Goal: Will not experience complications related to urinary retention Outcome: Progressing   Problem: Pain Managment: Goal: General experience of comfort will improve Outcome: Progressing   Problem: Safety: Goal: Ability to remain free from injury will improve Outcome: Progressing   Problem: Skin Integrity: Goal: Risk for impaired skin integrity will decrease Outcome: Progressing   Problem: Fluid Volume: Goal: Hemodynamic stability will improve Outcome: Progressing   Problem: Clinical Measurements: Goal: Diagnostic test results will improve Outcome: Progressing Goal: Signs and symptoms of infection will  decrease Outcome: Progressing   Problem: Respiratory: Goal: Ability to maintain adequate ventilation will improve Outcome: Progressing

## 2019-03-24 NOTE — Progress Notes (Signed)
IV placed by IV team last night, was lost, will not flush. Patient may benefit from a PICC line.

## 2019-03-24 NOTE — Progress Notes (Signed)
Patient has lost IV access, Dr Jannifer Franklin called and will be placing an order for a PICC line.

## 2019-03-24 NOTE — Progress Notes (Signed)
[facility] at [place] NAME: [name]    MR#:  [id number]  DATE OF BIRTH:  [birth year]  SUBJECTIVE:   Patient here due to left toe osteomyelitis.  Status post first ray amputation of left foot POD # 1 today. Still having some pain at the surgical site.  Seen by Podiatry and dressing has been changed today.   REVIEW OF SYSTEMS:    Review of Systems  Constitutional: Negative for chills and fever.  HENT: Negative for congestion and tinnitus.   Eyes: Negative for blurred vision and double vision.  Respiratory: Negative for cough, shortness of breath and wheezing.   Cardiovascular: Negative for chest pain, orthopnea and PND.  Gastrointestinal: Negative for abdominal pain, diarrhea, nausea and vomiting.  Genitourinary: Negative for dysuria and hematuria.  Neurological: Negative for dizziness, sensory change and focal weakness.  All other systems reviewed and are negative.   Nutrition: Carb modified Tolerating Diet: Yes Tolerating PT: Await Eval.    DRUG ALLERGIES:   Allergies  Allergen Reactions  . Albumin (Human)     unknown  . Erythromycin Rash  . Midecamycin Rash    VITALS:  Blood pressure 100/79, pulse 81, temperature 98.1 F (36.7 C), temperature source Oral, resp. rate 16, height 5\' 9"  (1.753 m), weight 95.3 kg, SpO2 98 %.  PHYSICAL EXAMINATION:   Physical Exam  GENERAL:  43 y.o.-year-old patient lying in bed in no acute distress.  EYES: Pupils equal, round, reactive to light and accommodation. No scleral icterus. Extraocular muscles intact.  HEENT: Head atraumatic, normocephalic. Oropharynx and nasopharynx clear.  NECK:  Supple, no jugular venous distention. No thyroid enlargement, no tenderness.  LUNGS: Normal breath sounds bilaterally, no wheezing, rales, rhonchi. No use of accessory muscles of respiration.  CARDIOVASCULAR: S1, S2 normal. No murmurs, rubs, or gallops.  ABDOMEN: Soft, nontender, nondistended. Bowel  sounds present. No organomegaly or mass.  EXTREMITIES: No cyanosis, clubbing or edema b/l. LLE dressing from surgery today.    NEUROLOGIC: Cranial nerves II through XII are intact. No focal Motor or sensory deficits b/l.   PSYCHIATRIC: The patient is alert and oriented x 3.  SKIN: No obvious rash, lesion, or ulcer.  LLE dressing in place from recent surgery today.    LABORATORY PANEL:   CBC Recent Labs  Lab 03/24/19 0527  WBC 9.7  HGB 10.6*  HCT 31.5*  PLT 303   ------------------------------------------------------------------------------------------------------------------  Chemistries  Recent Labs  Lab 03/23/19 0404 03/24/19 0527  NA 137  --   K 3.7  --   CL 106  --   CO2 25  --   GLUCOSE 305*  --   BUN 11  --   CREATININE 1.18* 1.11*  CALCIUM 8.1*  --    ------------------------------------------------------------------------------------------------------------------  Cardiac Enzymes Recent Labs  Lab 03/21/19 1826  TROPONINI <0.03   ------------------------------------------------------------------------------------------------------------------  RADIOLOGY:  No results found.   ASSESSMENT AND PLAN:   43 year old female with past medical history of diabetes, hypertension, substance abuse, chronic combined systolic diastolic CHF, history of seizures who presented to the hospital due to left lower extremity ulcer.  1.  Left lower extremity ulcer- suspicious for underlying osteomyelitis. - Seen by podiatry and status post left great toe fifth ray amputation postop day #1 today. -Deep wound cultures are still pending from surgery.   - Continue empiric antibiotics with Ancef, Flagyl. -Seen by infectious disease and continue current antibiotics.  2.  MSSA bacteremia- secondary to the osteomyelitis as mentioned. -  Continue IV Ancef, Flagyl for now.  TTE currently pending. - repeat blood cultures drawn yesterday and currently negative.  - will discus with ID  tomorrow regarding the longevity of the abx.   3.  Diabetes type 2 with neuropathy-continue Lantus, sliding scale insulin. - Continue carb controlled diet. BS stable.   4.  GERD-continue Protonix.  5.  Diabetic neuropathy-continue gabapentin.  6.  Hyperlipidemia-continue atorvastatin.  7. HTN - cont. Metoprolol.    8. Depression - cont. Effexor.   All the records are reviewed and case discussed with Care Management/Social Worker. Management plans discussed with the patient, family and they are in agreement.  CODE STATUS: Full code  DVT Prophylaxis: Lovenox  TOTAL TIME TAKING CARE OF THIS PATIENT: 25 minutes.   POSSIBLE D/C IN 2-3 DAYS, DEPENDING ON CLINICAL CONDITION.   Henreitta Leber M.D on 03/24/2019 at 1:47 PM  Between 7am to 6pm - Pager - [phone]  After 6pm go to [email or web address] - Proofreader  [facility]  Office  [phone]  CC: Primary care physician; Lacie Draft, NP

## 2019-03-24 NOTE — Progress Notes (Signed)
PT Cancellation Note  Patient Details Name: [name] MRN: [id number] DOB: [birth year]   Cancelled Treatment:    Reason Eval/Treat Not Completed: Other (comment)(Patient is PWB with post op shoe. Waiting on post op shoe to arrive to perform PT evaluation. Will continue following and perform evaluation once equipment is available. )  Janna Arch, PT, DPT   03/24/2019, 10:12 AM

## 2019-03-24 NOTE — Plan of Care (Signed)
  Problem: Education: Goal: Knowledge of General Education information will improve Description: Including pain rating scale, medication(s)/side effects and non-pharmacologic comfort measures Outcome: Progressing   Problem: Health Behavior/Discharge Planning: Goal: Ability to manage health-related needs will improve Outcome: Progressing   Problem: Clinical Measurements: Goal: Ability to maintain clinical measurements within normal limits will improve Outcome: Progressing Goal: Will remain free from infection Outcome: Progressing Goal: Diagnostic test results will improve Outcome: Progressing Goal: Respiratory complications will improve Outcome: Progressing Goal: Cardiovascular complication will be avoided Outcome: Progressing   Problem: Activity: Goal: Risk for activity intolerance will decrease Outcome: Progressing   Problem: Nutrition: Goal: Adequate nutrition will be maintained Outcome: Progressing   Problem: Coping: Goal: Level of anxiety will decrease Outcome: Progressing   Problem: Elimination: Goal: Will not experience complications related to bowel motility Outcome: Progressing Goal: Will not experience complications related to urinary retention Outcome: Progressing   Problem: Pain Managment: Goal: General experience of comfort will improve Outcome: Progressing   Problem: Safety: Goal: Ability to remain free from injury will improve Outcome: Progressing   Problem: Skin Integrity: Goal: Risk for impaired skin integrity will decrease Outcome: Progressing   Problem: Fluid Volume: Goal: Hemodynamic stability will improve Outcome: Progressing   Problem: Clinical Measurements: Goal: Diagnostic test results will improve Outcome: Progressing Goal: Signs and symptoms of infection will decrease Outcome: Progressing   Problem: Respiratory: Goal: Ability to maintain adequate ventilation will improve Outcome: Progressing   

## 2019-03-25 ENCOUNTER — Inpatient Hospital Stay: Payer: Self-pay

## 2019-03-25 DIAGNOSIS — F1490 Cocaine use, unspecified, uncomplicated: Secondary | ICD-10-CM

## 2019-03-25 DIAGNOSIS — Z89412 Acquired absence of left great toe: Secondary | ICD-10-CM

## 2019-03-25 LAB — GLUCOSE, CAPILLARY
Glucose-Capillary: 107 mg/dL — ABNORMAL HIGH (ref 70–99)
Glucose-Capillary: 75 mg/dL (ref 70–99)
Glucose-Capillary: 95 mg/dL (ref 70–99)

## 2019-03-25 LAB — BASIC METABOLIC PANEL
Anion gap: 5 (ref 5–15)
BUN: 16 mg/dL (ref 6–20)
CO2: 27 mmol/L (ref 22–32)
Calcium: 8.4 mg/dL — ABNORMAL LOW (ref 8.9–10.3)
Chloride: 108 mmol/L (ref 98–111)
Creatinine, Ser: 1.23 mg/dL — ABNORMAL HIGH (ref 0.44–1.00)
GFR calc Af Amer: >60 mL/min (ref >60)
GFR calc non Af Amer: 54 mL/min — ABNORMAL LOW (ref >60)
Glucose, Bld: 112 mg/dL — ABNORMAL HIGH (ref 70–99)
Potassium: 3.7 mmol/L (ref 3.5–5.1)
Sodium: 140 mmol/L (ref 135–145)

## 2019-03-25 LAB — CBC
HCT: 34.1 % — ABNORMAL LOW (ref 36.0–46.0)
Hemoglobin: 11.2 g/dL — ABNORMAL LOW (ref 12.0–15.0)
MCH: 30.2 pg (ref 26.0–34.0)
MCHC: 32.8 g/dL (ref 30.0–36.0)
MCV: 91.9 fL (ref 80.0–100.0)
Platelets: 338 10*3/uL (ref 150–400)
RBC: 3.71 MIL/uL — ABNORMAL LOW (ref 3.87–5.11)
RDW: 13.7 % (ref 11.5–15.5)
WBC: 7.6 10*3/uL (ref 4.0–10.5)
nRBC: 0 % (ref 0.0–0.2)

## 2019-03-25 MED ORDER — SODIUM CHLORIDE 0.9% FLUSH: 10.0000 mL | INTRAVENOUS | Status: DC | PRN

## 2019-03-25 MED ORDER — SODIUM CHLORIDE 0.9 % IV SOLN: INTRAVENOUS | Status: DC

## 2019-03-25 MED ORDER — SODIUM CHLORIDE 0.9% FLUSH
10.0000 mL | Freq: Two times a day (BID) | INTRAVENOUS | Status: DC
  Administered 2019-03-25 – 2019-03-28 (×5): 10 mL

## 2019-03-25 NOTE — Progress Notes (Signed)
[facility] Pod[facility]                                                      Patient Demographics  [name], is a 43 y.o. female   MRN: [id number]   DOB - Sep 03, 1976  Admit Date - 03/21/2019    Outpatient Primary MD for the patient is Lacie Draft, NP  Past Medical History:  Diagnosis Date  . Chronic combined systolic and diastolic CHF (congestive heart failure) ([place])    a. ECHO 04/23/14 LV EF: 45% - 50%,  mild LVH, diffuse hypokinesis. LV diastolic dysfunction, high ventricular filling pressure. Mild LA dilation.  . Cocaine abuse ([place])   . Diabetes mellitus without complication ([place])   . Hypertension   . Seizures ([place])       Past Surgical History:  Procedure Laterality Date  . AMPUTATION TOE Left 03/23/2019   Procedure: AMPUTATION TOE GREAT LEFT TOE;  Surgeon: [name] Patricia, DPM;  Location: [facility] ORS;  Service: Podiatry;  Laterality: Left;  . CHOLECYSTECTOMY  2005  . LOWER EXTREMITY ANGIOGRAPHY Left 03/12/2019   Procedure: LOWER EXTREMITY ANGIOGRAPHY;  Surgeon: Katha Cabal, MD;  Location: [facility];  Service: Cardiovascular;  Laterality: Left;  . TUBAL LIGATION      in for   Chief Complaint  Patient presents with  . Claudication  . Shortness of Breath     HPI  [name]  is a 43 y.o. female, 2 days status post amputation first ray left secondary to chronic osteomyelitis and gangrenous changes to the great toe  Physical Exam she is alert well oriented and pleasant today.  Vitals  Blood pressure (!) 125/96, pulse (!) 104, temperature 98.5 F (36.9 C), temperature source Oral, resp. rate 17, height 5\' 9"  (1.753 m), weight 95.3 kg, SpO2 97 %.  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  Overall I am pleased with his progress and it looks to be healing at a good rate. Data Review  CBC Recent Labs  Lab 03/21/19 1826 03/22/19 0446 03/24/19 0527 03/25/19 0340  WBC 9.7 5.9 9.7 7.6  HGB 12.6 10.7* 10.6* 11.2*  HCT 36.8 32.1* 31.5* 34.1*  PLT 312 270 303 338  MCV 88.7 89.9 89.0 91.9  MCH 30.4 30.0 29.9 30.2  MCHC 34.2 33.3 33.7 32.8  RDW 13.0 13.2 13.2 13.7  LYMPHSABS 1.3  --   --   --   MONOABS 1.0  --   --   --   EOSABS 0.1  --   --   --   BASOSABS 0.0  --   --   --    ------------------------------------------------------------------------------------------------------------------  Chemistries  Recent Labs  Lab 03/21/19 1826 03/22/19 0446 03/23/19 0404 03/24/19 0527 03/25/19 0340  NA 128* 134* 137  --  140  K 3.8 3.6 3.7  --  3.7  CL 91* 102 106  --  108  CO2 26 23 25   --  27  GLUCOSE 619* 303* 305*  --  112*  BUN 12 10 11   --  16  CREATININE 1.67* 1.15* 1.18* 1.11* 1.23*  CALCIUM 8.8* 8.2*  8.1*  --  8.4*   ------------------------------------------------------------------------------------------------------------------ estimated creatinine clearance is 72.4 mL/min (A) (by C-G formula based on SCr of 1.23 mg/dL (H)). -------------------------------------------------------------------------------- Assessment & Plan: Wound site looks very good and pleased with how it is progressing.  She had a PICC line put in but due to previous cocaine abuse she cannot really be sent home with that.  If she is going to need a couple weeks of IV antibiotics then she may have to either be placed in rehab or stay the whole time here in the hospital. I change the dressing today and this does need to be changed for really 2-3 more days.  I will see her again later in the week or one my partners well.  If she gets discharged to nursing home we just need to see her next week.  Bandages stay clean dry and  intact. Principal Problem:   Sepsis ([place]) Active Problems:   Bipolar disorder, unspecified ([place])   DM type 2 (diabetes mellitus, type 2)- poorly controlled   HTN (hypertension)   Chronic combined systolic and diastolic CHF (congestive heart failure) ([facility])   Toe osteomyelitis, left ([place])   Family Communication: Plan discussed with patien  [name] Patricia M.D on 03/25/2019 at 12:19 PM  Thank you for the consult, we will follow the patient with you in the Hospital.

## 2019-03-25 NOTE — Progress Notes (Signed)
[place] at [place] NAME: [name]    MR#:  [id number]  DATE OF BIRTH:  08-Apr-1976  SUBJECTIVE:   Patient here due to left toe osteomyelitis.  Status post first ray amputation of left foot POD # 2 today.   Had a PICC line placed today.  No other acute events overnight.  REVIEW OF SYSTEMS:    Review of Systems  Constitutional: Negative for chills and fever.  HENT: Negative for congestion and tinnitus.   Eyes: Negative for blurred vision and double vision.  Respiratory: Negative for cough, shortness of breath and wheezing.   Cardiovascular: Negative for chest pain, orthopnea and PND.  Gastrointestinal: Negative for abdominal pain, diarrhea, nausea and vomiting.  Genitourinary: Negative for dysuria and hematuria.  Neurological: Negative for dizziness, sensory change and focal weakness.  All other systems reviewed and are negative.   Nutrition: Carb modified Tolerating Diet: Yes Tolerating PT: Await Eval.    DRUG ALLERGIES:   Allergies  Allergen Reactions  . Albumin (Human)     unknown  . Erythromycin Rash  . Midecamycin Rash    VITALS:  Blood pressure 112/83, pulse (!) 104, temperature 97.8 F (36.6 C), temperature source Oral, resp. rate 17, height 5\' 9"  (1.753 m), weight 95.3 kg, SpO2 98 %.  PHYSICAL EXAMINATION:   Physical Exam  GENERAL:  43 y.o.-year-old patient lying in bed in no acute distress.  EYES: Pupils equal, round, reactive to light and accommodation. No scleral icterus. Extraocular muscles intact.  HEENT: Head atraumatic, normocephalic. Oropharynx and nasopharynx clear.  NECK:  Supple, no jugular venous distention. No thyroid enlargement, no tenderness.  LUNGS: Normal breath sounds bilaterally, no wheezing, rales, rhonchi. No use of accessory muscles of respiration.  CARDIOVASCULAR: S1, S2 normal. No murmurs, rubs, or gallops.  ABDOMEN: Soft, nontender, nondistended. Bowel sounds present. No  organomegaly or mass.  EXTREMITIES: No cyanosis, clubbing or edema b/l. LLE dressing from surgery today.    NEUROLOGIC: Cranial nerves II through XII are intact. No focal Motor or sensory deficits b/l.   PSYCHIATRIC: The patient is alert and oriented x 3.  SKIN: No obvious rash, lesion, or ulcer.  LLE dressing in place from recent surgery today.    LABORATORY PANEL:   CBC Recent Labs  Lab 03/25/19 0340  WBC 7.6  HGB 11.2*  HCT 34.1*  PLT 338   ------------------------------------------------------------------------------------------------------------------  Chemistries  Recent Labs  Lab 03/25/19 0340  NA 140  K 3.7  CL 108  CO2 27  GLUCOSE 112*  BUN 16  CREATININE 1.23*  CALCIUM 8.4*   ------------------------------------------------------------------------------------------------------------------  Cardiac Enzymes Recent Labs  Lab 03/21/19 1826  TROPONINI <0.03   ------------------------------------------------------------------------------------------------------------------  RADIOLOGY:  [place] Ekg Site Rite  Result Date: 03/24/2019 If Site Rite image not attached, placement could not be confirmed due to current cardiac rhythm.    ASSESSMENT AND PLAN:   43 year old female with past medical history of diabetes, hypertension, substance abuse, chronic combined systolic diastolic CHF, history of seizures who presented to the hospital due to left lower extremity ulcer.  1.  Left lower extremity ulcer- due to underlying Osteomyelitis.  - Seen by podiatry and status post left great toe fifth ray amputation postop day #2 today. -Deep wound cultures are still pending from surgery.   - Continue empiric antibiotics with Ancef, Flagyl. -Cussed with infectious disease and continue current antibiotics and will narrow them once deep wound cultures are back.  2.  MSSA bacteremia- secondary  to the osteomyelitis as mentioned. - Continue IV Ancef, Flagyl for now.  His TTE was  negative for endocarditis. -Cardiology consult obtained to get TEE which is planned for tomorrow. - repeat blood cultures currently negative.  - await further ID input.   3.  Diabetes type 2 with neuropathy-continue Lantus, sliding scale insulin. - Continue carb controlled diet. BS stable.   4.  GERD-continue Protonix.  5.  Diabetic neuropathy-continue gabapentin.  6.  Hyperlipidemia-continue atorvastatin.  7. HTN - cont. Metoprolol.    8. Depression - cont. Effexor.   Patient cannot go home with long-term IV antibiotics given her substance abuse.  Patient would benefit to discharge to short-term rehab and if she cannot go to rehab then she likely needs to stay in the hospital for the duration of antibiotic treatment therapy.  All the records are reviewed and case discussed with Care Management/Social Worker. Management plans discussed with the patient, family and they are in agreement.  CODE STATUS: Full code  DVT Prophylaxis: Lovenox  TOTAL TIME TAKING CARE OF THIS PATIENT: 30 minutes.   POSSIBLE D/C IN 2-3 DAYS, DEPENDING ON CLINICAL CONDITION.   Henreitta Leber M.D on 03/25/2019 at 3:30 PM  Between 7am to 6pm - Pager - [phone]  After 6pm go to [email or web address] - Proofreader  [facility]  Office  [phone]  CC: Primary care physician; Lacie Draft, NP

## 2019-03-25 NOTE — Plan of Care (Signed)
  Problem: Education: Goal: Knowledge of General Education information will improve Description: Including pain rating scale, medication(s)/side effects and non-pharmacologic comfort measures Outcome: Progressing   Problem: Health Behavior/Discharge Planning: Goal: Ability to manage health-related needs will improve Outcome: Progressing   Problem: Clinical Measurements: Goal: Ability to maintain clinical measurements within normal limits will improve Outcome: Progressing Goal: Will remain free from infection Outcome: Progressing Goal: Diagnostic test results will improve Outcome: Progressing Goal: Respiratory complications will improve Outcome: Progressing Goal: Cardiovascular complication will be avoided Outcome: Progressing   Problem: Activity: Goal: Risk for activity intolerance will decrease Outcome: Progressing   Problem: Nutrition: Goal: Adequate nutrition will be maintained Outcome: Progressing   Problem: Coping: Goal: Level of anxiety will decrease Outcome: Progressing   Problem: Elimination: Goal: Will not experience complications related to bowel motility Outcome: Progressing Goal: Will not experience complications related to urinary retention Outcome: Progressing   Problem: Pain Managment: Goal: General experience of comfort will improve Outcome: Progressing   Problem: Safety: Goal: Ability to remain free from injury will improve Outcome: Progressing   Problem: Skin Integrity: Goal: Risk for impaired skin integrity will decrease Outcome: Progressing   Problem: Fluid Volume: Goal: Hemodynamic stability will improve Outcome: Progressing   Problem: Clinical Measurements: Goal: Diagnostic test results will improve Outcome: Progressing Goal: Signs and symptoms of infection will decrease Outcome: Progressing   Problem: Respiratory: Goal: Ability to maintain adequate ventilation will improve Outcome: Progressing   

## 2019-03-25 NOTE — Progress Notes (Signed)
Peripherally Inserted Central Catheter/Midline Placement  The IV Nurse has discussed with the patient and/or persons authorized to consent for the patient, the purpose of this procedure and the potential benefits and risks involved with this procedure.  The benefits include less needle sticks, lab draws from the catheter, and the patient may be discharged home with the catheter. Risks include, but not limited to, infection, bleeding, blood clot (thrombus formation), and puncture of an artery; nerve damage and irregular heartbeat and possibility to perform a PICC exchange if needed/ordered by physician.  Alternatives to this procedure were also discussed.  Bard Power PICC patient education guide, fact sheet on infection prevention and patient information card has been provided to patient /or left at bedside.    PICC/Midline Placement Documentation  PICC Single Lumen 83/41/96 PICC Right Basilic 38 cm 0 cm (Active)  Indication for Insertion or Continuance of Line Prolonged intravenous therapies 03/25/2019 10:06 AM  Exposed Catheter (cm) 0 cm 03/25/2019 10:06 AM  Site Assessment Clean;Dry;Intact 03/25/2019 10:06 AM  Line Status Flushed;Blood return noted;Saline locked 03/25/2019 10:06 AM  Dressing Type Transparent;Securing device 03/25/2019 10:06 AM  Dressing Status Clean;Dry;Intact;Antimicrobial disc in place 03/25/2019 10:06 AM  Dressing Change Due 04/01/19 03/25/2019 10:06 AM       Frances Maywood 03/25/2019, 10:08 AM

## 2019-03-25 NOTE — Progress Notes (Signed)
PT Cancellation Note  Patient Details Name: [name] MRN: [id number] DOB: 08/15/1976   Cancelled Treatment:    Reason Eval/Treat Not Completed: Other (comment).  PT eval attempted but therapist did not see post op shoe in pts room (pt reporting she never received a post op shoe).  Nurse notified of need for post op shoe and pt reporting being size 8.  Will re-attempt PT evaluation at a later date/time once pt has post op shoe in pt's room.  Leitha Bleak, PT 03/25/19, 2:25 PM [phone]

## 2019-03-25 NOTE — Progress Notes (Signed)
    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  TTE on 03/24/2019 showed an EF of 55-60%, DD, normal RVSF, moderate thickening of the mitral valve with mild functional mitral stenosis with tethering and calcification of the subchordal apparatus.  After careful review of history and examination, the risks and benefits of transesophageal echocardiogram have been explained including risks of esophageal damage, perforation (1:10,000 risk), bleeding, pharyngeal hematoma as well as other potential complications associated with conscious sedation including aspiration, arrhythmia, respiratory failure and death. Alternatives to treatment were discussed, questions were answered. Patient is willing to proceed.   She does have a history of dysphagia and is s/p prior esophageal dilation ~ 2 years ago. The patient has already eaten today. Therefore, procedure will be scheduled for 03/26/2019.   Christell Faith, PA-C 03/25/2019 10:30 AM

## 2019-03-25 NOTE — Progress Notes (Signed)
   Date of Admission:  03/21/2019        Subjective: Foot feeling better No fever  Medications:  . amitriptyline  150 mg Oral QHS  . atorvastatin  20 mg Oral QHS  . enoxaparin (LOVENOX) injection  40 mg Subcutaneous Q24H  . furosemide  20 mg Oral BID  . gabapentin  100 mg Oral TID  . hydrOXYzine  50 mg Oral BID  . insulin aspart  0-20 Units Subcutaneous TID WC  . insulin aspart  0-5 Units Subcutaneous QHS  . insulin glargine  45 Units Subcutaneous BID  . metoprolol tartrate  50 mg Oral BID  . multivitamin with minerals  1 tablet Oral Daily  . pantoprazole  20 mg Oral Daily  . Ensure Max Protein  11 oz Oral BID  . QUEtiapine  800 mg Oral QHS  . sodium chloride flush  10-40 mL Intracatheter Q12H  . venlafaxine XR  300 mg Oral Daily  . vitamin C  250 mg Oral BID    Objective: Vital signs in last 24 hours: Temp:  [98 F (36.7 C)-98.5 F (36.9 C)] 98.5 F (36.9 C) (06/01 0809) Pulse Rate:  [76-111] 104 (06/01 1021) Resp:  [16-17] 17 (06/01 0809) BP: (91-125)/(70-96) 125/96 (06/01 1021) SpO2:  [85 %-97 %] 97 % (06/01 0809)  PHYSICAL EXAM:  General: Alert, cooperative, no distress, appears stated age.  Head: Normocephalic, without obvious abnormality, atraumatic. Eyes: Conjunctivae clear, anicteric sclerae. Pupils are equal ENT Nares normal. No drainage or sinus tenderness. edentulous Neck: Supple, symmetrical, no adenopathy, thyroid: non tender no carotid bruit and no JVD. Back: No CVA tenderness. Lungs: Clear to auscultation bilaterally. No Wheezing or Rhonchi. No rales. Heart: Regular rate and rhythm, no murmur, rub or gallop. Abdomen: Soft, non-tender,not distended. Bowel sounds normal. No masses Extremities: left great toe- ray amputation- surgical site clean with coapted sutures, no discharge, swelling of the left foot much improved Skin: No rashes or lesions. Or bruising Lymph: Cervical, supraclavicular normal. Neurologic: Grossly non-focal  Lab  Results Recent Labs    03/23/19 0404 03/24/19 0527 03/25/19 0340  WBC  --  9.7 7.6  HGB  --  10.6* 11.2*  HCT  --  31.5* 34.1*  NA 137  --  140  K 3.7  --  3.7  CL 106  --  108  CO2 25  --  27  BUN 11  --  16  CREATININE 1.18* 1.11* 1.23*   Liver Panel No results for input(s): PROT, ALBUMIN, AST, ALT, ALKPHOS, BILITOT, BILIDIR, IBILI in the last 72 hours. Sedimentation Rate No results for input(s): ESRSEDRATE in the last 72 hours. C-Reactive Protein No results for input(s): CRP in the last 72 hours.  Microbiology:  Studies/Results: [place] Ekg Site Rite  Result Date: 03/24/2019 If Site Rite image not attached, placement could not be confirmed due to current cardiac rhythm.    Assessment/Plan: Left foot infection with great toe necrosis in  patient with DM ,s/p firt ray amputation- awaiting cultue to finalize before vanco can be discontinued  Staph  aureus bacteremia- MSSA- on cefazolin 2 d echo no vegetation /will need TEE especially with episodes of transient left sided weakness need to r/o endocarditis  H/o cocaine use , will not send her home with PICC even if her urine screen is negative for cocaine  DM- not well controlled  Hardware left ankle  Discussed with patient and Dr.Sainani

## 2019-03-25 NOTE — Progress Notes (Signed)
PICC to be placed this morning.

## 2019-03-25 NOTE — H&P (View-Only) (Signed)
    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  TTE on 03/24/2019 showed an EF of 55-60%, DD, normal RVSF, moderate thickening of the mitral valve with mild functional mitral stenosis with tethering and calcification of the subchordal apparatus.  After careful review of history and examination, the risks and benefits of transesophageal echocardiogram have been explained including risks of esophageal damage, perforation (1:10,000 risk), bleeding, pharyngeal hematoma as well as other potential complications associated with conscious sedation including aspiration, arrhythmia, respiratory failure and death. Alternatives to treatment were discussed, questions were answered. Patient is willing to proceed.   She does have a history of dysphagia and is s/p prior esophageal dilation ~ 2 years ago. The patient has already eaten today. Therefore, procedure will be scheduled for 03/26/2019.   Christell Faith, PA-C 03/25/2019 10:30 AM

## 2019-03-26 ENCOUNTER — Inpatient Hospital Stay (HOSPITAL_COMMUNITY)
Admit: 2019-03-26 | Discharge: 2019-03-26 | Disposition: A | Discharge status: Home / Self Care | Payer: Medicaid Other | Attending: Physician Assistant | Admitting: Physician Assistant

## 2019-03-26 ENCOUNTER — Encounter
Admission: EM | Disposition: A | Discharge status: Home Health | Payer: Self-pay | Source: Home / Self Care | Attending: Specialist

## 2019-03-26 DIAGNOSIS — R7881 Bacteremia: Secondary | ICD-10-CM

## 2019-03-26 DIAGNOSIS — I340 Nonrheumatic mitral (valve) insufficiency: Secondary | ICD-10-CM

## 2019-03-26 HISTORY — PX: TEE WITHOUT CARDIOVERSION: SHX5443

## 2019-03-26 LAB — GLUCOSE, CAPILLARY
Glucose-Capillary: 60 mg/dL — ABNORMAL LOW (ref 70–99)
Glucose-Capillary: 88 mg/dL (ref 70–99)
Glucose-Capillary: 82 mg/dL (ref 70–99)
Glucose-Capillary: 73 mg/dL (ref 70–99)
Glucose-Capillary: 159 mg/dL — ABNORMAL HIGH (ref 70–99)
Glucose-Capillary: 199 mg/dL — ABNORMAL HIGH (ref 70–99)

## 2019-03-26 LAB — CULTURE, BLOOD (ROUTINE X 2)
Culture: NO GROWTH
Special Requests: ADEQUATE

## 2019-03-26 LAB — AEROBIC/ANAEROBIC CULTURE W GRAM STAIN (SURGICAL/DEEP WOUND)

## 2019-03-26 LAB — PROTIME-INR
INR: 1 (ref 0.8–1.2)
Prothrombin Time: 12.6 seconds (ref 11.4–15.2)

## 2019-03-26 LAB — CREATININE, SERUM
Creatinine, Ser: 1.04 mg/dL — ABNORMAL HIGH (ref 0.44–1.00)
GFR calc Af Amer: >60 mL/min (ref >60)
GFR calc non Af Amer: >60 mL/min (ref >60)

## 2019-03-26 LAB — VANCOMYCIN, PEAK: Vancomycin Pk: 21 ug/mL — ABNORMAL LOW (ref 30–40)

## 2019-03-26 SURGERY — ECHOCARDIOGRAM, TRANSESOPHAGEAL
Anesthesia: General

## 2019-03-26 MED ORDER — MIDAZOLAM HCL 5 MG/5ML IJ SOLN
INTRAMUSCULAR | Status: AC
  Filled 2019-03-26: qty 5

## 2019-03-26 MED ORDER — POLYETHYLENE GLYCOL 3350 17 G PO PACK
17.0000 g | PACK | Freq: Every day | ORAL | Status: DC
  Administered 2019-03-26 – 2019-03-28 (×3): 17 g via ORAL
  Filled 2019-03-26 (×3): qty 1

## 2019-03-26 MED ORDER — SODIUM CHLORIDE FLUSH 0.9 % IV SOLN
INTRAVENOUS | Status: AC
  Filled 2019-03-26: qty 10

## 2019-03-26 MED ORDER — MIDAZOLAM HCL 5 MG/5ML IJ SOLN
INTRAMUSCULAR | Status: AC | PRN
  Administered 2019-03-26: 2 mg via INTRAVENOUS

## 2019-03-26 MED ORDER — DEXTROSE 50 % IV SOLN
INTRAVENOUS | Status: AC
  Administered 2019-03-26: 08:00:00 25 mL
  Filled 2019-03-26: qty 50

## 2019-03-26 MED ORDER — FENTANYL CITRATE (PF) 100 MCG/2ML IJ SOLN
INTRAMUSCULAR | Status: AC | PRN
  Administered 2019-03-26: 25 ug via INTRAVENOUS

## 2019-03-26 MED ORDER — LIDOCAINE VISCOUS HCL 2 % MT SOLN
OROMUCOSAL | Status: AC
  Filled 2019-03-26: qty 15

## 2019-03-26 MED ORDER — FENTANYL CITRATE (PF) 100 MCG/2ML IJ SOLN
INTRAMUSCULAR | Status: AC
  Filled 2019-03-26: qty 2

## 2019-03-26 MED ORDER — MIDAZOLAM HCL 5 MG/5ML IJ SOLN
INTRAMUSCULAR | Status: AC | PRN
  Administered 2019-03-26: 1 mg via INTRAVENOUS

## 2019-03-26 MED ORDER — INSULIN GLARGINE 100 UNIT/ML ~~LOC~~ SOLN
40.0000 [IU] | Freq: Two times a day (BID) | SUBCUTANEOUS | Status: DC
  Administered 2019-03-26 – 2019-03-28 (×4): 40 [IU] via SUBCUTANEOUS
  Filled 2019-03-26 (×6): qty 0.4

## 2019-03-26 NOTE — Treatment Plan (Signed)
Diagnosis: Bacteremia (staph aureus and strep mitis bacteremia) mital valve endocarditis Necrotic wound great toe left ( S/p amputation) Baseline Creatinine 1.04 Culture Result:  As above  Allergies  Allergen Reactions  . Albumin (Human)     unknown  . Erythromycin Rash  . Midecamycin Rash    OPAT Orders cefazolin 2 grams IV every 8 hours until 05/02/19 [facility] Per Protocol:  Labs weekly on Mondays while on IV antibiotics: _X_ CBC with differential  _X_ CMP  _X_ ESR   _X_ Please pull PIC at completion of IV antibiotics  Fax weekly lab results  promptly to [phone]  Clinic Follow Up Appt:in 3 weeks   Call [phone] to make appt

## 2019-03-26 NOTE — Progress Notes (Signed)
PT Cancellation Note  Patient Details Name: [name] MRN: [id number] DOB: 12-Sep-1976   Cancelled Treatment:    Reason Eval/Treat Not Completed: Patient at procedure or test/unavailable.  Pt currently off floor at procedure.  Will re-attempt PT evaluation at a later date/time.  Leitha Bleak, PT 03/26/19, 9:54 AM [phone]

## 2019-03-26 NOTE — NC FL2 (Signed)
[place] LEVEL OF CARE SCREENING TOOL     IDENTIFICATION  Patient Name: [name] Birthdate: May 28, 1976 Sex: female Admission Date (Current Location): 03/21/2019  [place] and [place] Number:  Engineering geologist and Address:  [facility], [address] [place], [place] [address]      Provider Number: [id number]  Attending Physician Name and Address:  Henreitta Leber, MD  Relative Name and Phone Number:  [name] Mother [phone]    Current Level of Care: Hospital Recommended Level of Care: [place] Prior Approval Number:    Date Approved/Denied: 04/25/14 PASRR Number: [id number]  Discharge Plan: SNF    Current Diagnoses: Patient Active Problem List   Diagnosis Date Noted  . Bacteremia   . HTN (hypertension) 03/21/2019  . Chronic combined systolic and diastolic CHF (congestive heart failure) ([place]) 03/21/2019  . Toe osteomyelitis, left ([place]) 03/21/2019  . Sepsis ([place]) 03/21/2019  . Atherosclerosis of native arteries of the extremities with ulceration ([place]) 03/04/2019  . Ulcer of great toe, left, with fat layer exposed ([place]) 09/28/2018  . Menometrorrhagia 06/29/2018  . 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 Secondary localized osteoarthrosis of ankle and foot 01/10/2018  . Chronic foot pain, left 02/20/2017  . Leg edema, left 02/20/2017  . 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 Urinary retention with incomplete bladder emptying 05/02/2014  . 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 Cocaine abuse ([place]) 04/23/2014  . Bipolar disorder, unspecified ([place]) 04/23/2014  . Morbid obesity ([place]) 04/23/2014  . DM type 2 (diabetes mellitus, type 2)- poorly controlled 04/23/2014  . Toxic encephalopathy 04/20/2014  . 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 Lactic acid acidosis 04/20/2014  . Paraparesis of both lower limbs ([place]) 04/20/2014  . HCAP (healthcare-associated pneumonia)  04/20/2014  . Post-traumatic stress disorder, unspecified 03/20/2014  . Severe cannabis use disorder ([place]) 03/20/2014    Orientation RESPIRATION BLADDER Height & Weight     Self, Time, Situation  Normal Continent Weight: 95.3 kg Height:  5\' 9"  (175.3 cm)  BEHAVIORAL SYMPTOMS/MOOD NEUROLOGICAL BOWEL NUTRITION STATUS      Continent Diet(regular)  AMBULATORY STATUS COMMUNICATION OF NEEDS Skin   Limited Assist Verbally Normal                       Personal Care Assistance Level of Assistance              Functional Limitations Info  Sight, Hearing, Speech Sight Info: Adequate Hearing Info: Adequate Speech Info: Adequate    SPECIAL CARE FACTORS FREQUENCY  (IV ABX cefazolin fopr 6 weeks)                    Contractures Contractures Info: Not present    Additional Factors Info  Code Status, Allergies Code Status Info: full Allergies Info: Albumin, Erethomycin           Current Medications (03/26/2019):  This is the current hospital active medication list Current Facility-Administered Medications  Medication Dose Route Frequency Provider Last Rate Last Dose  . 0.9 %  sodium chloride infusion   Intravenous PRN End, Christopher, MD 10 mL/hr at 03/25/19 2212 250 mL at 03/25/19 2212  . acetaminophen (TYLENOL) tablet 650 mg  650 mg Oral Q6H PRN End, Harrell Gave, MD   650 mg at 03/25/19 1022   Or  . acetaminophen (TYLENOL) suppository 650 mg  650 mg Rectal Q6H PRN End, Harrell Gave, MD      .  amitriptyline (ELAVIL) tablet 150 mg  150 mg Oral QHS End, Christopher, MD   150 mg at 03/26/19 0022  . atorvastatin (LIPITOR) tablet 20 mg  20 mg Oral QHS End, Christopher, MD   20 mg at 03/25/19 2235  . ceFAZolin (ANCEF) IVPB 2g/100 mL premix  2 g Intravenous Q8H End, Christopher, MD 200 mL/hr at 03/26/19 1358 2 g at 03/26/19 1358  . enoxaparin (LOVENOX) injection 40 mg  40 mg Subcutaneous Q24H End, Christopher, MD   40 mg at 03/26/19 1354  . fentaNYL (SUBLIMAZE) 100 MCG/2ML  injection           . furosemide (LASIX) tablet 20 mg  20 mg Oral BID End, Christopher, MD   20 mg at 03/25/19 2241  . gabapentin (NEURONTIN) capsule 100 mg  100 mg Oral TID End, Christopher, MD   100 mg at 03/26/19 1510  . HYDROcodone-acetaminophen (NORCO/VICODIN) 5-325 MG per tablet 1 tablet  1 tablet Oral Q4H PRN End, Christopher, MD   1 tablet at 03/25/19 1730  . hydrOXYzine (ATARAX/VISTARIL) tablet 50 mg  50 mg Oral BID End, Harrell Gave, MD   50 mg at 03/25/19 2241  . insulin aspart (novoLOG) injection 0-20 Units  0-20 Units Subcutaneous TID WC End, Christopher, MD   11 Units at 03/24/19 0810  . insulin aspart (novoLOG) injection 0-5 Units  0-5 Units Subcutaneous QHS End, Christopher, MD   3 Units at 03/23/19 2201  . insulin glargine (LANTUS) injection 40 Units  40 Units Subcutaneous BID Sainani, Vivek J, MD      . lidocaine (XYLOCAINE) 2 % viscous mouth solution           . metoprolol tartrate (LOPRESSOR) tablet 50 mg  50 mg Oral BID End, Christopher, MD   50 mg at 03/25/19 2235  . metroNIDAZOLE (FLAGYL) IVPB 500 mg  500 mg Intravenous Q8H End, Christopher, MD 100 mL/hr at 03/26/19 0351 500 mg at 03/26/19 0351  . midazolam (VERSED) 5 MG/5ML injection           . multivitamin with minerals tablet 1 tablet  1 tablet Oral Daily End, Christopher, MD   1 tablet at 03/25/19 1024  . ondansetron (ZOFRAN) tablet 4 mg  4 mg Oral Q6H PRN End, Christopher, MD   4 mg at 03/25/19 1730   Or  . ondansetron (ZOFRAN) injection 4 mg  4 mg Intravenous Q6H PRN End, Harrell Gave, MD      . pantoprazole (PROTONIX) EC tablet 20 mg  20 mg Oral Daily End, Christopher, MD   20 mg at 03/25/19 1024  . polyethylene glycol (MIRALAX / GLYCOLAX) packet 17 g  17 g Oral Daily Henreitta Leber, MD   17 g at 03/26/19 1510  . protein supplement (ENSURE MAX) liquid  11 oz Oral BID End, Christopher, MD   11 oz at 03/25/19 2233  . QUEtiapine (SEROQUEL) tablet 800 mg  800 mg Oral QHS End, Christopher, MD   800 mg at 03/25/19 2239  .  sodium chloride flush (NS) 0.9 % injection 10-40 mL  10-40 mL Intracatheter Q12H End, Christopher, MD   10 mL at 03/25/19 1024  . sodium chloride flush (NS) 0.9 % injection 10-40 mL  10-40 mL Intracatheter PRN End, Christopher, MD      . sodium chloride flush 0.9 % injection           . venlafaxine XR (EFFEXOR-XR) 24 hr capsule 300 mg  300 mg Oral Daily End, Harrell Gave, MD   300 mg  at 03/25/19 1025  . vitamin C (ASCORBIC ACID) tablet 250 mg  250 mg Oral BID End, Christopher, MD   250 mg at 03/25/19 2240     Discharge Medications: Please see discharge summary for a list of discharge medications.  Relevant Imaging Results:  Relevant Lab Results:   Additional Information [id number]  Su Hilt, RN

## 2019-03-26 NOTE — Evaluation (Signed)
Physical Therapy Evaluation Patient Details Name: [name] MRN: [id number] DOB: 1975/11/12 Today's Date: 03/26/2019   History of Present Illness  Patient is a 43 year old female s/p first ray amputation of L foot. PMH includes polysubstance abuse with cocaine, sepsis, bipolar disorder, Dm type 2 (poorly controlled), HTN, CHF, seizures, and PTSD  Clinical Impression  Patient is a 43 year old female s/p first ray amputation of L foot with PWB precautions. Patient educated on PWB status and proper use of RW for SPT while maintaining precautions. Patient performed SPT twice with good compliance with PWB status demonstrating understanding with verbal cueing for sequencing. Patient not medically cleared for further mobility at this time. Patient will benefit from skilled physical therapy to progress mobility, strength, and maintain PWB status. Upon discharge patient will benefit from SNF placement.      Follow Up Recommendations SNF    Equipment Recommendations  None recommended by PT    Recommendations for Other Services       Precautions / Restrictions Precautions Precautions: Fall Restrictions Weight Bearing Restrictions: Yes LLE Weight Bearing: Partial weight bearing Other Position/Activity Restrictions: transfer to/from bed to/from chair/BSC/wheelchair      Mobility  Bed Mobility               General bed mobility comments: in chair upon PT arrival  Transfers Overall transfer level: Modified independent Equipment used: Rolling walker (2 wheeled)             General transfer comment: Patient requires education on PWB status and proper use of RW for decreased weight acceptance onto L foot.   Ambulation/Gait             General Gait Details: not medically cleared for ambulation   Stairs            Wheelchair Mobility    Modified Rankin (Stroke Patients Only)       Balance Overall balance assessment: Needs assistance Sitting-balance support:  No upper extremity supported;Feet unsupported Sitting balance-Leahy Scale: Fair Sitting balance - Comments: sits upright without LOB   Standing balance support: Bilateral upper extremity supported;During functional activity Standing balance-Leahy Scale: Fair Standing balance comment: requires use of UE's for SPT to maintain PWB status                             Pertinent Vitals/Pain Pain Assessment: No/denies pain    Home Living Family/patient expects to be discharged to:: Private residence Living Arrangements: Parent(mom) Available Help at Discharge: Family Type of Home: Mobile home Home Access: Stairs to enter   Entrance Stairs-Number of Steps: ramp Home Layout: One level Home Equipment: Clinical cytogeneticist - 2 wheels;Wheelchair - manual Additional Comments: Patient states she uses her walker in the house and the wheelchair out of the house.     Prior Function Level of Independence: Needs assistance   Gait / Transfers Assistance Needed: uses a walker for short distances in the house and a walker for out of the house  ADL's / Homemaking Assistance Needed: mom helps with homemaking        Hand Dominance   Dominant Hand: Right    Extremity/Trunk Assessment   Upper Extremity Assessment Upper Extremity Assessment: Overall WFL for tasks assessed    Lower Extremity Assessment Lower Extremity Assessment: Overall WFL for tasks assessed(Grossly 4-/5 strength: L ankle not tested)       Communication   Communication: No difficulties  Cognition  Behavior During Therapy: WFL for tasks assessed/performed Overall Cognitive Status: Within Functional Limits for tasks assessed                                 General Comments: A and O x 4      General Comments General comments (skin integrity, edema, etc.): L foot wrapped    Exercises Other Exercises Other Exercises: patient educated on PWB status, proper use of RW for SPT and demonstrated safe  transfers x 2   Assessment/Plan    PT Assessment Patient needs continued PT services  PT Problem List Decreased strength;Decreased activity tolerance;Decreased balance;Decreased knowledge of use of DME;Decreased coordination;Decreased mobility;Decreased safety awareness;Decreased knowledge of precautions;Obesity       PT Treatment Interventions DME instruction;Functional mobility training;Therapeutic activities;Therapeutic exercise;Balance training;Gait training;Neuromuscular re-education;Manual techniques;Wheelchair mobility training;Patient/family education    PT Goals (Current goals can be found in the Care Plan section)  Acute Rehab PT Goals Patient Stated Goal: to return to PLOF PT Goal Formulation: With patient Time For Goal Achievement: 04/09/19 Potential to Achieve Goals: Fair    Frequency Min 2X/week   Barriers to discharge Decreased caregiver support patient will benefit from SNF placement due to medical needs and limited mobility     Co-evaluation               AM-PAC PT "6 Clicks" Mobility  Outcome Measure Help needed turning from your back to your side while in a flat bed without using bedrails?: None Help needed moving from lying on your back to sitting on the side of a flat bed without using bedrails?: None Help needed moving to and from a bed to a chair (including a wheelchair)?: A Little Help needed standing up from a chair using your arms (e.g., wheelchair or bedside chair)?: A Little Help needed to walk in hospital room?: Total Help needed climbing 3-5 steps with a railing? : Total 6 Click Score: 16    End of Session Equipment Utilized During Treatment: Gait belt Activity Tolerance: Patient tolerated treatment well Patient left: in chair;with nursing/sitter in room;with call bell/phone within reach Nurse Communication: Mobility status;Weight bearing status PT Visit Diagnosis: Unsteadiness on feet (R26.81);Other abnormalities of gait and mobility  (R26.89);Muscle weakness (generalized) (M62.81)    Time: 8110-3159 PT Time Calculation (min) (ACUTE ONLY): 10 min   Charges:   PT Evaluation $PT Eval Low Complexity: [place], PT, DPT    Janna Arch 03/26/2019, 4:59 PM

## 2019-03-26 NOTE — Progress Notes (Signed)
Hypoglycemic Event  CBG: 60  Treatment: 25 mL D50  Symptoms: Sweating  Follow-up CBG: [id number] CBG Result:88  Possible Reasons for Event: Pt is NPO  Comments/MD notified:MD Sainani notified    Kerry Dory

## 2019-03-26 NOTE — Progress Notes (Addendum)
PHARMACY CONSULT NOTE FOR:  OUTPATIENT  PARENTERAL ANTIBIOTIC THERAPY (OPAT)  Indication: Bacteremia (staph aureus and strep mitis bacteremia) mitral valve endocarditis Necrotic wound great toe left ( S/p amputation) Regimen: cefazolin 2 g IV q8h End date: 05/02/19  IV antibiotic discharge orders are pended. To discharging provider:  please sign these orders via discharge navigator,  Select New Orders & click on the button choice - Manage This Unsigned Work.     Thank you for allowing pharmacy to be a part of this patient's care.  Tawnya Crook, PharmD Pharmacy Resident  03/26/2019 2:00 PM

## 2019-03-26 NOTE — Progress Notes (Signed)
Pt off floor for procedure

## 2019-03-26 NOTE — Progress Notes (Signed)
[facility] [place] NAME: Delva Derden    MR#:  [id number]  DATE OF BIRTH:  Aug 21, 1976  SUBJECTIVE:   Patient here due to left toe osteomyelitis.  Status post first ray amputation of left foot POD # 3 today.   This post TEE today showing small mitral valve vegetation.  Patient already had her PICC line placed yesterday.  No other complaints presently.  REVIEW OF SYSTEMS:    Review of Systems  Constitutional: Negative for chills and fever.  HENT: Negative for congestion and tinnitus.   Eyes: Negative for blurred vision and double vision.  Respiratory: Negative for cough, shortness of breath and wheezing.   Cardiovascular: Negative for chest pain, orthopnea and PND.  Gastrointestinal: Negative for abdominal pain, diarrhea, nausea and vomiting.  Genitourinary: Negative for dysuria and hematuria.  Neurological: Negative for dizziness, sensory change and focal weakness.  All other systems reviewed and are negative.   Nutrition: Carb modified Tolerating Diet: Yes Tolerating PT: Await Eval.    DRUG ALLERGIES:   Allergies  Allergen Reactions  . Albumin (Human)     unknown  . Erythromycin Rash  . Midecamycin Rash    VITALS:  Blood pressure 113/62, pulse 87, temperature 98.3 F (36.8 C), temperature source Oral, resp. rate 16, height 5\' 9"  (1.753 m), weight 95.3 kg, SpO2 100 %.  PHYSICAL EXAMINATION:   Physical Exam  GENERAL:  43 y.o.-year-old patient lying in bed in no acute distress.  EYES: Pupils equal, round, reactive to light and accommodation. No scleral icterus. Extraocular muscles intact.  HEENT: Head atraumatic, normocephalic. Oropharynx and nasopharynx clear.  NECK:  Supple, no jugular venous distention. No thyroid enlargement, no tenderness.  LUNGS: Normal breath sounds bilaterally, no wheezing, rales, rhonchi. No use of accessory muscles of respiration.  CARDIOVASCULAR: S1, S2 normal. No murmurs, rubs, or gallops.   ABDOMEN: Soft, nontender, nondistended. Bowel sounds present. No organomegaly or mass.  EXTREMITIES: No cyanosis, clubbing or edema b/l. LLE dressing from surgery today.    NEUROLOGIC: Cranial nerves II through XII are intact. No focal Motor or sensory deficits b/l.   PSYCHIATRIC: The patient is alert and oriented x 3.  SKIN: No obvious rash, lesion, or ulcer.  LLE dressing in place from recent surgery today.    LABORATORY PANEL:   CBC Recent Labs  Lab 03/25/19 0340  WBC 7.6  HGB 11.2*  HCT 34.1*  PLT 338   ------------------------------------------------------------------------------------------------------------------  Chemistries  Recent Labs  Lab 03/25/19 0340 03/26/19 0319  NA 140  --   K 3.7  --   CL 108  --   CO2 27  --   GLUCOSE 112*  --   BUN 16  --   CREATININE 1.23* 1.04*  CALCIUM 8.4*  --    ------------------------------------------------------------------------------------------------------------------  Cardiac Enzymes Recent Labs  Lab 03/21/19 1826  TROPONINI <0.03   ------------------------------------------------------------------------------------------------------------------  RADIOLOGY:  [place] Ekg Site Rite  Result Date: 03/24/2019 If Site Rite image not attached, placement could not be confirmed due to current cardiac rhythm.    ASSESSMENT AND PLAN:   43 year old female with past medical history of diabetes, hypertension, substance abuse, chronic combined systolic diastolic CHF, history of seizures who presented to the hospital due to left lower extremity ulcer.  1.  Left lower extremity ulcer- due to underlying Osteomyelitis.  - Seen by podiatry and status post left great toe fifth ray amputation postop day #3 today. -Wound cultures from surgery growing staph aureus  which is MSSA, E. coli and Morganella species.   -Continue IV Ancef for now.  Discussed with infectious disease and given patient's bacteremia and suspected endocarditis she  will need at least 6 weeks of IV Ancef.  2.  MSSA bacteremia- secondary to the osteomyelitis as mentioned. -TTE was negative for endocarditis, but patient underwent TEE which showed a small mitral valve vegetation.   -Discussed with infectious disease and patient will need 6 weeks of IV antibiotics.  Patient had PICC line placed yesterday. -Continue IV Ancef for 6 weeks.  repeat blood cultures are negative so far.  3.  Diabetes type 2 with neuropathy- Patient had some hypoglycemia this morning.  Appreciate diabetes coordinator input.  Lower Lantus dose, continue sliding scale insulin.  Follow blood sugars.  4.  GERD-continue Protonix.  5.  Diabetic neuropathy-continue gabapentin.  6.  Hyperlipidemia-continue atorvastatin.  7. HTN - cont. Metoprolol.    8. Depression - cont. Effexor.   Patient cannot go home with long-term IV antibiotics given her substance abuse.  Plan for discharge to possible SNF with long-term IV antibiotics.  Social work has been made aware.   All the records are reviewed and case discussed with Care Management/Social Worker. Management plans discussed with the patient, family and they are in agreement.  CODE STATUS: Full code  DVT Prophylaxis: Lovenox  TOTAL TIME TAKING CARE OF THIS PATIENT: 30 minutes.   POSSIBLE D/C IN 2-3 DAYS, DEPENDING ON CLINICAL CONDITION.   Henreitta Leber M.D on 03/26/2019 at 2:11 PM  Between 7am to 6pm - Pager - [phone]  After 6pm go to [email or web address] - Proofreader  [facility]  Office  [phone]  CC: Primary care physician; Lacie Draft, NP

## 2019-03-26 NOTE — Interval H&P Note (Signed)
History and Physical Interval Note:  03/26/2019 9:39 AM  [name]  has presented today for surgery, with the diagnosis of methacillin sensitive staphylococcus aureus bacteremia.  The various methods of treatment have been discussed with the patient and family. After consideration of risks, benefits and other options for treatment, the patient has consented to  Procedure(s): TRANSESOPHAGEAL ECHOCARDIOGRAM (TEE) (N/A) as a surgical intervention.  The patient's history has been reviewed, patient examined, no change in status, stable for surgery.  I have reviewed the patient's chart and labs.  Questions were answered to the patient's satisfaction.     [name]

## 2019-03-26 NOTE — Progress Notes (Signed)
*  PRELIMINARY RESULTS* Echocardiogram Echocardiogram Transesophageal has been performed.  Wallie Char Zayin Valadez 03/26/2019, 10:16 AM

## 2019-03-26 NOTE — TOC Initial Note (Signed)
Transition of Care [facility]) - Initial/Assessment Note    Patient Details  Name: [name] MRN: [id number] Date of Birth: Jul 07, 1976  Transition of Care [facility]) CM/SW Contact:    Su Hilt, RN Phone Number: 03/26/2019, 3:19 PM  Clinical Narrative:                 Met with the patient to discuss DC plan and needs, she lives at home with her mother and uses a walker and wheelchair at home, she is in agreeance to go to SNF for 6 weeks for IV ABX, she states that [facility] is closer to her home and it is fine to do bed search in that area, Completed FL2 and PASSR completed, bedsearch sent out, will review bed offers with patient once received  Expected Discharge Plan: Skilled Nursing Facility Barriers to Discharge: Continued Medical Work up   Patient Goals and CMS Choice Patient states their goals for this hospitalization and ongoing recovery are:: get better      Expected Discharge Plan and Services Expected Discharge Plan: [place]       Living arrangements for the past 2 months: Single Family Home Expected Discharge Date: 03/25/19                                    Prior Living Arrangements/Services Living arrangements for the past 2 months: Single Family Home Lives with:: Parents Patient language and need for interpreter reviewed:: Yes Do you feel safe going back to the place where you live?: Yes      Need for Family Participation in Patient Care: No (Comment) Care giver support system in place?: Yes (comment) Current home services: (walker, wheelchair) Criminal Activity/Legal Involvement Pertinent to Current Situation/Hospitalization: No - Comment as needed  Activities of Daily Living Home Assistive Devices/Equipment: Wheelchair ADL Screening (condition at time of admission) Patient's cognitive ability adequate to safely complete daily activities?: Yes Is the patient deaf or have difficulty hearing?: No Does the patient  have difficulty seeing, even when wearing glasses/contacts?: No Does the patient have difficulty concentrating, remembering, or making decisions?: Yes Patient able to express need for assistance with ADLs?: Yes Does the patient have difficulty dressing or bathing?: Yes Independently performs ADLs?: Yes (appropriate for developmental age) Communication: Independent Dressing (OT): Independent Is this a change from baseline?: Pre-admission baseline Grooming: Independent Is this a change from baseline?: Pre-admission baseline Feeding: Independent Bathing: Independent Is this a change from baseline?: Pre-admission baseline Toileting: Independent In/Out Bed: Needs assistance Is this a change from baseline?: Pre-admission baseline Walks in Home: Independent with device (comment) Is this a change from baseline?: Pre-admission baseline Does the patient have difficulty walking or climbing stairs?: Yes Weakness of Legs: Left Weakness of Arms/Hands: None  Permission Sought/Granted Permission sought to share information with : Case Manager Permission granted to share information with : Yes, Verbal Permission Granted              Emotional Assessment Appearance:: Appears older than stated age Attitude/Demeanor/Rapport: Engaged Affect (typically observed): Accepting Orientation: : Oriented to Self, Oriented to Place, Oriented to  Time, Oriented to Situation Alcohol / Substance Use: Tobacco Use Psych Involvement: No (comment)  Admission diagnosis:  Foot pain increased heart rate Patient Active Problem List   Diagnosis Date Noted  . Bacteremia   . HTN (hypertension) 03/21/2019  . Chronic combined systolic and diastolic CHF (congestive heart failure) ([place])  03/21/2019  . Toe osteomyelitis, left ([place]) 03/21/2019  . Sepsis ([place]) 03/21/2019  . Atherosclerosis of native arteries of the extremities with ulceration ([place]) 03/04/2019  . Ulcer of great toe, left, with fat layer exposed ([name])  09/28/2018  . Menometrorrhagia 06/29/2018  . 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 Secondary localized osteoarthrosis of ankle and foot 01/10/2018  . Chronic foot pain, left 02/20/2017  . Leg edema, left 02/20/2017  . 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 Urinary retention with incomplete bladder emptying 05/02/2014  . 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 Cocaine abuse ([place]) 04/23/2014  . Bipolar disorder, unspecified ([place]) 04/23/2014  . Morbid obesity ([place]) 04/23/2014  . DM type 2 (diabetes mellitus, type 2)- poorly controlled 04/23/2014  . Toxic encephalopathy 04/20/2014  . 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 Lactic acid acidosis 04/20/2014  . 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 HCAP (healthcare-associated pneumonia) 04/20/2014  . Post-traumatic stress disorder, unspecified 03/20/2014  . Severe cannabis use disorder ([place]) 03/20/2014   PCP:  Lacie Draft, NP Pharmacy:   [facility] [address] [place] [address] Phone: [phone] Fax: [place], [place] - [place] [address] Phone: [phone] Fax: [place], [place] - [address] [place] [address] Phone: [phone] Fax: [phone]  [place], [place] - [place] [address] Phone: [phone] Fax: [phone]     Social Determinants of Health (SDOH) Interventions    Readmission Risk Interventions No flowsheet data found.

## 2019-03-26 NOTE — CV Procedure (Signed)
    Transesophageal Echocardiogram Note  [name] [id number] Oct 08, 1976  Procedure: Transesophageal Echocardiogram Indications: Bacteremia and osteomyelitis  Procedure Details Consent: Obtained Time Out: Verified patient identification, verified procedure, site/side was marked, verified correct patient position, special equipment/implants available, Radiology Safety Procedures followed,  medications/allergies/relevent history reviewed, required imaging and test results available.  Performed  Medications:  During this procedure the patient is administered a total of Versed 3 mg and Fentanyl 50 mcg  to achieve and maintain moderate conscious sedation.  The patient's heart rate, blood pressure, and oxygen saturation are monitored continuously during the procedure. The period of conscious sedation is 21 minutes, of which I was present face-to-face 100% of this time.  Left Ventrical:  Normal LVEF  Mitral Valve: Irregular thickening of the anterior leaflet.  Small, minimally mobile echodensity arising from the left atrial side of the posterior leaflet measuring 0.5 x 0.4 cm is suspicious for a vegetation.  Mild to moderate regurgitation.  Mildly to moderately elevated transmitral gradient.  Normal valve area by PHT.  Aortic Valve: Normal.  Tricuspid Valve: Normal with trivial regurgitation.  Pulmonic Valve: Normal with trivial regurgitation.  Left Atrium/ Left atrial appendage: No thrombus.  Atrial septum: Intact without right to left shunt.  Aorta: Not well-visualized.  Complications: No apparent complications Patient did tolerate procedure well.  Nelva Bush, MD 03/26/2019, 10:20 AM

## 2019-03-26 NOTE — Progress Notes (Signed)
Inpatient Diabetes Program Recommendations  AACE/ADA: New Consensus Statement on Inpatient Glycemic Control   Target Ranges:  Prepandial:   less than 140 mg/dL      Peak postprandial:   less than 180 mg/dL (1-2 hours)      Critically ill patients:  140 - 180 mg/dL   Results for [name], [name] (MRN [id number]) as of 03/26/2019 09:09  Ref. Range 03/25/2019 08:06 03/25/2019 11:46 03/25/2019 17:18 03/26/2019 07:48 03/26/2019 08:39  Glucose-Capillary Latest Ref Range: 70 - 99 mg/dL 107 (H) 75 95 60 (L) 88   Review of Glycemic Control  Diabetes history: DM2 Outpatient Diabetes medications:Lantus 40 units BID, Novolog 4-14 units TID with meals, Metformin 1000 mg BID Current orders for Inpatient glycemic control: Lantus 45 units BID, Novolog 0-20 units TID with meals, Novolog 0-5 units QHS  Inpatient Diabetes Program Recommendations:   Insulin - Basal: Fasting glucose 60 mg/dl today. Please consider decreasing Lantus to 40 units BID.  Thanks, Barnie Alderman, RN, MSN, CDE Diabetes Coordinator Inpatient Diabetes Program [phone] (Team Pager from 8am to 5pm)

## 2019-03-27 ENCOUNTER — Encounter: Payer: Self-pay | Admitting: Internal Medicine

## 2019-03-27 DIAGNOSIS — I330 Acute and subacute infective endocarditis: Secondary | ICD-10-CM

## 2019-03-27 DIAGNOSIS — B964 Proteus (mirabilis) (morganii) as the cause of diseases classified elsewhere: Secondary | ICD-10-CM

## 2019-03-27 DIAGNOSIS — B9620 Unspecified Escherichia coli [E. coli] as the cause of diseases classified elsewhere: Secondary | ICD-10-CM

## 2019-03-27 LAB — CULTURE, BLOOD (ROUTINE X 2)
Culture: NO GROWTH
Culture: NO GROWTH
Special Requests: ADEQUATE

## 2019-03-27 LAB — GLUCOSE, CAPILLARY
Glucose-Capillary: 155 mg/dL — ABNORMAL HIGH (ref 70–99)
Glucose-Capillary: 83 mg/dL (ref 70–99)
Glucose-Capillary: 132 mg/dL — ABNORMAL HIGH (ref 70–99)
Glucose-Capillary: 110 mg/dL — ABNORMAL HIGH (ref 70–99)
Glucose-Capillary: 94 mg/dL (ref 70–99)

## 2019-03-27 LAB — SURGICAL PATHOLOGY

## 2019-03-27 MED ORDER — LACTULOSE 10 GM/15ML PO SOLN
30.0000 g | Freq: Two times a day (BID) | ORAL | Status: DC | PRN
  Filled 2019-03-27: qty 60

## 2019-03-27 NOTE — Progress Notes (Signed)
Nutrition Follow-up  RD working remotely.  DOCUMENTATION CODES:   Obesity unspecified  INTERVENTION:  Will discontinue Ensure Max Protein per patient request.  Encouraged adequate intake of calories and protein at meals.  Continue daily MVI and vitamin C 250 mg BID.  NUTRITION DIAGNOSIS:   Increased nutrient needs related to wound healing as evidenced by estimated needs.  Ongoing.  GOAL:   Patient will meet greater than or equal to 90% of their needs  Progressing.  MONITOR:   PO intake, Supplement acceptance, Labs, Weight trends, I & O's, Skin  REASON FOR ASSESSMENT:   Malnutrition Screening Tool    ASSESSMENT:   43 y/o female with h/o DM, substance abuse, CHF, bipolar admitted with sepsis secondary to left great toe osteomyelitis/acute on chronic diabetic foot wound    -Patient s/p first ray amputation on left foot on 5/30.  Spoke with patient over the phone. She reports her appetite is much better now and she is eating 100% of all of her meals. She does not like the Ensure Max Protein and would like it discontinued. Discussed increased protein needs for healing, but patient does not want any oral nutrition supplements at this time. Discussed foods that contain protein and encouraged adequate intake at meals.  Medications reviewed and include: gabapentin, Novolog 0-20 units TID, Novolog 0-5 units QHS, Lantus 40 units BID, MVI daily, pantoprazole, Miralax 17 grams daily, Seroquel QHS, vitamin C 250 mg BID, cefazolin, Flagyl.  Labs reviewed: CBG 83-199.  Diet Order:   Diet Order            Diet heart healthy/carb modified Room service appropriate? Yes; Fluid consistency: Thin  Diet effective now             EDUCATION NEEDS:   Not appropriate for education at this time  Skin:  Skin Assessment: Skin Integrity Issues:(closd incision left foot)  Last BM:  03/22/2019 - medium type 6  Height:   Ht Readings from Last 1 Encounters:  03/21/19 5\' 9"  (1.753 m)    Weight:   Wt Readings from Last 1 Encounters:  03/21/19 95.3 kg   Ideal Body Weight:  65.9 kg  BMI:  Body mass index is 31.01 kg/m.  Estimated Nutritional Needs:   Kcal:  1900-2200kcal/day   Protein:  95-110g/day   Fluid:  >2L/day   Willey Blade, MS, RD, LDN Office: [phone] Pager: [phone] After Hours/Weekend Pager: [phone]

## 2019-03-27 NOTE — Progress Notes (Signed)
[facility] Podiatry                                                      Patient Demographics  [name], is a 43 y.o. female   MRN: [id number]   DOB - [birth year]  Admit Date - 03/21/2019    Outpatient Primary MD for the patient is Lacie Draft, NP  Consult requested in the Hospital by Henreitta Leber, MD, On 03/27/2019   Past Medical History:  Diagnosis Date  . Chronic combined systolic and diastolic CHF (congestive heart failure) ([place])    a. ECHO 04/23/14 LV EF: 45% - 50%,  mild LVH, diffuse hypokinesis. LV diastolic dysfunction, high ventricular filling pressure. Mild LA dilation.  . Cocaine abuse ([place])   . Diabetes mellitus without complication ([place])   . Hypertension   . Seizures ([place])       Past Surgical History:  Procedure Laterality Date  . AMPUTATION TOE Left 03/23/2019   Procedure: AMPUTATION TOE GREAT LEFT TOE;  Surgeon: [name] Patricia, DPM;  Location: [facility] ORS;  Service: Podiatry;  Laterality: Left;  . CHOLECYSTECTOMY  2005  . LOWER EXTREMITY ANGIOGRAPHY Left 03/12/2019   Procedure: LOWER EXTREMITY ANGIOGRAPHY;  Surgeon: Katha Cabal, MD;  Location: [facility] CV LAB;  Service: Cardiovascular;  Laterality: Left;  . TUBAL LIGATION      in for   Chief Complaint  Patient presents with  . Claudication  . Shortness of Breath     HPI  [name]  is a 43 y.o. female, 3 days status post first ray amputation left foot    Social History Social History   Tobacco Use  . Smoking status: Current Every Day Smoker    Packs/day: 1.00    Years: 28.00    Pack years: 28.00  . Smokeless tobacco: Never Used  Substance Use Topics  . Alcohol use: Not Currently    Alcohol/week: 0.0 standard drinks    Comment: Quit drinking 8 months ago (09/01/15)    Family History Family History  Problem Relation Age of Onset  . Hypertension  Other   . Drug abuse Mother   . Seizures Neg Hx     Prior to Admission medications   Medication Sig Start Date End Date Taking? Authorizing Provider  amitriptyline (ELAVIL) 150 MG tablet Take 150 mg by mouth at bedtime.   Yes [provider]  atorvastatin (LIPITOR) 20 MG tablet Take 20 mg by mouth at bedtime.    Yes [provider]  furosemide (LASIX) 20 MG tablet Take 20 mg by mouth 2 (two) times a day.   Yes [provider]  gabapentin (NEURONTIN) 600 MG tablet Take 1 tablet (600 mg total) by mouth 3 (three) times daily. 09/28/18  Yes Stover, Titorya, DPM  hydrOXYzine (ATARAX/VISTARIL) 50 MG tablet Take 50 mg by mouth 2 (two) times a day.  08/21/18  Yes [provider]  insulin aspart (NOVOLOG) 100 UNIT/ML injection Inject 4-14 Units into the skin 3 (three) times daily as needed for high blood sugar.   Yes [provider]  insulin glargine (LANTUS) 100 UNIT/ML injection Inject 40 Units into the skin 2 (two) times a day.   Yes [provider]  Insulin Syringes, Disposable, U-100 0.3 ML MISC Inject 24 Units into the skin at bedtime. 05/14/14  Yes  Love, Pamela S, PA-C  meloxicam (MOBIC) 7.5 MG tablet Take 7.5 mg by mouth daily as needed for pain.  08/21/18  Yes [provider]  metFORMIN (GLUCOPHAGE) 1000 MG tablet Take 1,000 mg by mouth 2 (two) times daily with a meal.   Yes [provider]  metoprolol tartrate (LOPRESSOR) 50 MG tablet Take 50 mg by mouth 2 (two) times daily.   Yes [provider]  mirtazapine (REMERON) 15 MG tablet Take 7.5-15 mg by mouth at bedtime. 03/13/19  Yes [provider]  pantoprazole (PROTONIX) 20 MG tablet Take 20 mg by mouth daily.  08/21/18  Yes [provider]  prazosin (MINIPRESS) 5 MG capsule Take 5 mg by mouth at bedtime.   Yes [provider]  promethazine (PHENERGAN) 12.5 MG tablet Take 12.5 mg by mouth 2 (two) times daily as needed for nausea or vomiting.   08/21/18  Yes [provider]  QUEtiapine (SEROQUEL) 400 MG tablet Take 800 mg by mouth at bedtime.   Yes [provider]  SIVEXTRO 200 MG TABS Take 1 tablet by mouth daily. Patient taking differently: Take 1 tablet by mouth at bedtime.  02/28/19  Yes Powers, Evern Core, MD  venlafaxine XR (EFFEXOR-XR) 150 MG 24 hr capsule Take 300 mg by mouth daily.  09/14/18  Yes [provider]    Anti-infectives (From admission, onward)   Start     Dose/Rate Route Frequency Ordered Stop   03/23/19 0000  metroNIDAZOLE (FLAGYL) IVPB 500 mg     500 mg 100 mL/hr over 60 Minutes Intravenous Every 8 hours 03/22/19 2303     03/22/19 2000  vancomycin (VANCOCIN) IVPB 1000 mg/200 mL premix  Status:  Discontinued     1,000 mg 200 mL/hr over 60 Minutes Intravenous Every 24 hours 03/21/19 2237 03/22/19 1235   03/22/19 2000  vancomycin (VANCOCIN) 1,500 mg in sodium chloride 0.9 % 500 mL IVPB  Status:  Discontinued     1,500 mg 250 mL/hr over 120 Minutes Intravenous Every 24 hours 03/22/19 1235 03/26/19 1049   03/22/19 1700  ceFAZolin (ANCEF) IVPB 2g/100 mL premix     2 g 200 mL/hr over 30 Minutes Intravenous Every 8 hours 03/22/19 1556     03/22/19 1300  ceFEPIme (MAXIPIME) 2 g in sodium chloride 0.9 % 100 mL IVPB  Status:  Discontinued     2 g 200 mL/hr over 30 Minutes Intravenous Every 8 hours 03/22/19 1226 03/22/19 1556   03/22/19 0800  vancomycin (VANCOCIN) IVPB 1000 mg/200 mL premix  Status:  Discontinued     1,000 mg 200 mL/hr over 60 Minutes Intravenous Every 24 hours 03/21/19 2232 03/21/19 2237   03/21/19 2200  ceFEPIme (MAXIPIME) 2 g in sodium chloride 0.9 % 100 mL IVPB  Status:  Discontinued     2 g 200 mL/hr over 30 Minutes Intravenous Every 12 hours 03/21/19 2142 03/22/19 1226   03/21/19 2030  vancomycin (VANCOCIN) 1,500 mg in sodium chloride 0.9 % 500 mL IVPB     1,500 mg 250 mL/hr over 120 Minutes Intravenous  Once 03/21/19 2002 03/21/19 2226   03/21/19 2000  vancomycin  (VANCOCIN) IVPB 1000 mg/200 mL premix  Status:  Discontinued     1,000 mg 200 mL/hr over 60 Minutes Intravenous  Once 03/21/19 1951 03/21/19 2002      Scheduled Meds: . amitriptyline  150 mg Oral QHS  . atorvastatin  20 mg Oral QHS  . enoxaparin (LOVENOX) injection  40 mg Subcutaneous Q24H  .  furosemide  20 mg Oral BID  . gabapentin  100 mg Oral TID  . hydrOXYzine  50 mg Oral BID  . insulin aspart  0-20 Units Subcutaneous TID WC  . insulin aspart  0-5 Units Subcutaneous QHS  . insulin glargine  40 Units Subcutaneous BID  . metoprolol tartrate  50 mg Oral BID  . multivitamin with minerals  1 tablet Oral Daily  . pantoprazole  20 mg Oral Daily  . polyethylene glycol  17 g Oral Daily  . Ensure Max Protein  11 oz Oral BID  . QUEtiapine  800 mg Oral QHS  . sodium chloride flush  10-40 mL Intracatheter Q12H  . venlafaxine XR  300 mg Oral Daily  . vitamin C  250 mg Oral BID   Continuous Infusions: . sodium chloride 250 mL (03/25/19 2212)  .  ceFAZolin (ANCEF) IV 2 g (03/27/19 0501)  . metronidazole 500 mg (03/27/19 0121)   PRN Meds:.sodium chloride, acetaminophen **OR** acetaminophen, HYDROcodone-acetaminophen, ondansetron **OR** ondansetron (ZOFRAN) IV, sodium chloride flush  Allergies  Allergen Reactions  . Albumin (Human)     unknown  . Erythromycin Rash  . Midecamycin Rash    Physical Exam  Vitals  Blood pressure 96/62, pulse 70, temperature 98.3 F (36.8 C), temperature source Oral, resp. rate 16, height 5\' 9"  (1.753 m), weight 95.3 kg, SpO2 96 %.  Lower Extremity exam: Dressing change today incision margin is stable is no sign of drainage infection or dehiscence.  No redness or cellulitis around the wound.   Data Review  CBC Recent Labs  Lab 03/21/19 1826 03/22/19 0446 03/24/19 0527 03/25/19 0340  WBC 9.7 5.9 9.7 7.6  HGB 12.6 10.7* 10.6* 11.2*  HCT 36.8 32.1* 31.5* 34.1*  PLT 312 270 303 338  MCV 88.7 89.9 89.0 91.9  MCH 30.4 30.0 29.9 30.2  MCHC 34.2  33.3 33.7 32.8  RDW 13.0 13.2 13.2 13.7  LYMPHSABS 1.3  --   --   --   MONOABS 1.0  --   --   --   EOSABS 0.1  --   --   --   BASOSABS 0.0  --   --   --    ------------------------------------------------------------------------------------------------------------------  Chemistries  Recent Labs  Lab 03/21/19 1826 03/22/19 0446 03/23/19 0404 03/24/19 0527 03/25/19 0340 03/26/19 0319  Heather 128* 134* 137  --  140  --   K 3.8 3.6 3.7  --  3.7  --   CL 91* 102 106  --  108  --   CO2 26 23 25   --  27  --   GLUCOSE 619* 303* 305*  --  112*  --   BUN 12 10 11   --  16  --   CREATININE 1.67* 1.15* 1.18* 1.11* 1.23* 1.04*  CALCIUM 8.8* 8.2* 8.1*  --  8.4*  --    ---------------- Assessment & Plan: Change dressing today.  She is very stable she is got beads sent to rehab.  Antibiotics as per Dr. Tama High.  Dressing of gout on our can stay on until she comes to see me in my office next week.  If dressing comes off this change with sterile gauze and Kerlix.  Partial weightbearing is okay on the left foot as long she is got the postop shoe on and the walker.  Principal Problem:   Sepsis ([place]) Active Problems:   Bipolar disorder, unspecified ([place])   DM type 2 (diabetes mellitus, type 2)- poorly controlled   HTN (  hypertension)   Chronic combined systolic and diastolic CHF (congestive heart failure) ([facility])   Toe osteomyelitis, left ([place])   Bacteremia   Family Communication: Plan discussed with patient  [name] Patricia M.D on 03/27/2019 at 8:22 AM  Thank you for the consult, we will follow the patient with you in the Hospital.

## 2019-03-27 NOTE — Progress Notes (Signed)
[place] [facility] [place] NAME: [name]    MR#:  [id number]  DATE OF BIRTH:  1976-02-25  SUBJECTIVE:   Patient here due to left toe osteomyelitis.  Status post first ray amputation of left foot POD # 4 today.   This post TEE yesterday small mitral valve vegetation.  Patient already had her PICC line placed. Pt. Is complaining of some abd. Pain due to constipation.   REVIEW OF SYSTEMS:    Review of Systems  Constitutional: Negative for chills and fever.  HENT: Negative for congestion and tinnitus.   Eyes: Negative for blurred vision and double vision.  Respiratory: Negative for cough, shortness of breath and wheezing.   Cardiovascular: Negative for chest pain, orthopnea and PND.  Gastrointestinal: Positive for constipation. Negative for abdominal pain, diarrhea, nausea and vomiting.  Genitourinary: Negative for dysuria and hematuria.  Neurological: Negative for dizziness, sensory change and focal weakness.  All other systems reviewed and are negative.   Nutrition: Carb modified Tolerating Diet: Yes Tolerating PT: Await Eval.    DRUG ALLERGIES:   Allergies  Allergen Reactions  . Albumin (Human)     unknown  . Erythromycin Rash  . Midecamycin Rash    VITALS:  Blood pressure 96/62, pulse 70, temperature 98.3 F (36.8 C), temperature source Oral, resp. rate 16, height 5\' 9"  (1.753 m), weight 95.3 kg, SpO2 96 %.  PHYSICAL EXAMINATION:   Physical Exam  GENERAL:  43 y.o.-year-old patient lying in bed in no acute distress.  EYES: Pupils equal, round, reactive to light and accommodation. No scleral icterus. Extraocular muscles intact.  HEENT: Head atraumatic, normocephalic. Oropharynx and nasopharynx clear.  NECK:  Supple, no jugular venous distention. No thyroid enlargement, no tenderness.  LUNGS: Normal breath sounds bilaterally, no wheezing, rales, rhonchi. No use of accessory muscles of respiration.  CARDIOVASCULAR: S1, S2  normal. No murmurs, rubs, or gallops.  ABDOMEN: Soft, nontender, nondistended. Bowel sounds present. No organomegaly or mass.  EXTREMITIES: No cyanosis, clubbing or edema b/l. LLE dressing in place with no acute drainage noted.     NEUROLOGIC: Cranial nerves II through XII are intact. No focal Motor or sensory deficits b/l.   PSYCHIATRIC: The patient is alert and oriented x 3.  SKIN: No obvious rash, lesion, or ulcer.    LABORATORY PANEL:   CBC Recent Labs  Lab 03/25/19 0340  WBC 7.6  HGB 11.2*  HCT 34.1*  PLT 338   ------------------------------------------------------------------------------------------------------------------  Chemistries  Recent Labs  Lab 03/25/19 0340 03/26/19 0319  NA 140  --   K 3.7  --   CL 108  --   CO2 27  --   GLUCOSE 112*  --   BUN 16  --   CREATININE 1.23* 1.04*  CALCIUM 8.4*  --    ------------------------------------------------------------------------------------------------------------------  Cardiac Enzymes Recent Labs  Lab 03/21/19 1826  TROPONINI <0.03   ------------------------------------------------------------------------------------------------------------------  RADIOLOGY:  No results found.   ASSESSMENT AND PLAN:   43 year old female with past medical history of diabetes, hypertension, substance abuse, chronic combined systolic diastolic CHF, history of seizures who presented to the hospital due to left lower extremity ulcer.  1.  Left lower extremity ulcer- due to underlying Osteomyelitis.  - Seen by podiatry and status post left great toe fifth ray amputation postop day #4 today. -Wound cultures from surgery growing staph aureus which is MSSA, E. coli and Morganella species.   -Continue IV Ancef for now.  Discussed with infectious  disease and given patient's bacteremia and suspected endocarditis she will need at least 6 weeks of IV Ancef.  2.  MSSA bacteremia- secondary to the osteomyelitis as mentioned. - TTE  was negative for endocarditis, but patient underwent TEE which showed a small mitral valve vegetation.   - Discussed with infectious disease and patient will need 6 weeks of IV antibiotics. Pt. Had PICC line in place now.  -Continue IV Ancef for 6 weeks.  repeat blood cultures are negative so far.  3.  Diabetes type 2 with neuropathy- no further hypoglycemia.  - cont. Lantus,  Follow blood sugars.  4.  GERD-continue Protonix.  5.  Diabetic neuropathy-continue gabapentin.  6.  Hyperlipidemia-continue atorvastatin.  7. HTN - cont. Metoprolol.    8. Depression - cont. Effexor.   Patient cannot go home with long-term IV antibiotics given her substance abuse.  Plan for discharge to possible SNF with long-term IV antibiotics.  Social work working on placement.    All the records are reviewed and case discussed with Care Management/Social Worker. Management plans discussed with the patient, family and they are in agreement.  CODE STATUS: Full code  DVT Prophylaxis: Lovenox  TOTAL TIME TAKING CARE OF THIS PATIENT: 30 minutes.   POSSIBLE D/C IN 2-3 DAYS, DEPENDING ON CLINICAL CONDITION.   Henreitta Leber M.D on 03/27/2019 at 4:03 PM  Between 7am to 6pm - Pager - [phone]  After 6pm go to [email or web address] - Proofreader  [facility]  Office  [phone]  CC: Primary care physician; Lacie Draft, NP

## 2019-03-27 NOTE — Progress Notes (Signed)
Date of Admission:  03/21/2019     Doing well Wants to go home No fever or chills     Medications:  . amitriptyline  150 mg Oral QHS  . atorvastatin  20 mg Oral QHS  . enoxaparin (LOVENOX) injection  40 mg Subcutaneous Q24H  . furosemide  20 mg Oral BID  . gabapentin  100 mg Oral TID  . hydrOXYzine  50 mg Oral BID  . insulin aspart  0-20 Units Subcutaneous TID WC  . insulin aspart  0-5 Units Subcutaneous QHS  . insulin glargine  40 Units Subcutaneous BID  . metoprolol tartrate  50 mg Oral BID  . multivitamin with minerals  1 tablet Oral Daily  . pantoprazole  20 mg Oral Daily  . polyethylene glycol  17 g Oral Daily  . Ensure Max Protein  11 oz Oral BID  . QUEtiapine  800 mg Oral QHS  . sodium chloride flush  10-40 mL Intracatheter Q12H  . venlafaxine XR  300 mg Oral Daily  . vitamin C  250 mg Oral BID    Objective: Vital signs in last 24 hours: Temp:  [98.1 F (36.7 C)-98.7 F (37.1 C)] 98.3 F (36.8 C) (06/03 0759) Pulse Rate:  [70-93] 70 (06/03 0759) Resp:  [16-18] 16 (06/03 0759) BP: (96-115)/(62-86) 96/62 (06/03 0759) SpO2:  [96 %-100 %] 96 % (06/03 0759)  PHYSICAL EXAM:  General: Alert, cooperative, no distress, appears stated age.  Head: Normocephalic, without obvious abnormality, atraumatic. Eyes: Conjunctivae clear, anicteric sclerae. Pupils are equal ENT edentulous Neck: Supple, symmetrical, no adenopathy, thyroid: non tender no carotid bruit and no JVD. Back: No CVA tenderness. Lungs: Clear to auscultation bilaterally. No Wheezing or Rhonchi. No rales. Heart: Regular rate and rhythm, no murmur, rub or gallop. Abdomen: Soft, non-tender,not distended. Bowel sounds normal. No masses Extremities: left foot- great toe amputated- site looks very clean with o erythema or swelling    Skin: No rashes or lesions. Or bruising Lymph: Cervical, supraclavicular normal. Neurologic: Grossly non-focal  Lab Results Recent Labs    03/25/19 0340 03/26/19 0319   WBC 7.6  --   HGB 11.2*  --   HCT 34.1*  --   NA 140  --   K 3.7  --   CL 108  --   CO2 27  --   BUN 16  --   CREATININE 1.23* 1.04*   Microbiology: 03/21/19 MSSA and strep mitis in blood culture 5/29 BC NG  Studies/Results: No results found.   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 But pt asked me to speak to her aunt as she will be living with . Called her aunt Sherlean Foot [phone]) who knew about her drug use and said she does not do IV and she also said she will take responsibility for her and do her Iv antibiotic and also make sure she does not use cocaine Aut will have to come tomorrow to the hospital and meet with me and also learn how to administer IV. Pt will  have weekly urine tox screen. She is in agreement  DM- not well controlled  Hardware left ankle

## 2019-03-28 LAB — GLUCOSE, CAPILLARY
Glucose-Capillary: 70 mg/dL (ref 70–99)
Glucose-Capillary: 209 mg/dL — ABNORMAL HIGH (ref 70–99)

## 2019-03-28 MED ORDER — CEFAZOLIN IV (FOR PTA / DISCHARGE USE ONLY): 2.0000 g | Freq: Three times a day (TID) | INTRAVENOUS | 0 refills | Status: AC

## 2019-03-28 NOTE — TOC Transition Note (Signed)
Transition of Care [facility]) - CM/SW Discharge Note   Patient Details  Name: [name] MRN: [id number] Date of Birth: [birth year]  Transition of Care [facility]) CM/SW Contact:  Su Hilt, RN Phone Number: 03/28/2019, 3:19 PM   Clinical Narrative:     Patient to discharge home to stay with her aunt [name] at [place] [address], Home health set up by [facility] to take care of the PICC line and weekly labs.  [facility] set up to manage the IV ABX Patient goes to Dr Rosana Hoes for PCP and uses [name] as well as [facility] to get medication and she can afford medications with no problem with her Medicaid.  She drives and family provides back up transportation   Final next level of care: [place] Barriers to Discharge: Barriers Resolved   Patient Goals and CMS Choice Patient states their goals for this hospitalization and ongoing recovery are:: get better      Discharge Placement                       Discharge Plan and Services                                     Social Determinants of Health (SDOH) Interventions     Readmission Risk Interventions No flowsheet data found.

## 2019-03-28 NOTE — TOC Progression Note (Signed)
Transition of Care [facility]) - Progression Note    Patient Details  Name: [name] MRN: [id number] Date of Birth: 1976-06-18  Transition of Care [name]) CM/SW [place], RN Phone Number: 03/28/2019, 9:21 AM  Clinical Narrative:    Still awaiting bed offers for IV ABX for 6 weeks   Expected Discharge Plan: [place] Barriers to Discharge: Continued Medical Work up  Expected Discharge Plan and Services Expected Discharge Plan: [place] arrangements for the past 2 months: Single Family Home Expected Discharge Date: 03/25/19                                     Social Determinants of Health (SDOH) Interventions    Readmission Risk Interventions No flowsheet data found.

## 2019-03-28 NOTE — Progress Notes (Signed)
Discharge instructions reviewed with patient. She verbalized understanding. Patient leaving with PICC line. Stable condition. Patient dressed and will be leaving with her aunt.

## 2019-03-28 NOTE — Discharge Summary (Signed)
[name] at [place] NAME: [name]    MR#:  [id number]  DATE OF BIRTH:  08-Dec-1975  DATE OF ADMISSION:  03/21/2019 ADMITTING PHYSICIAN: Lance Coon, MD  DATE OF DISCHARGE: 03/28/2019  PRIMARY CARE PHYSICIAN: Lacie Draft, NP    ADMISSION DIAGNOSIS:  Foot pain increased heart rate  DISCHARGE DIAGNOSIS:  Principal Problem:   Sepsis ([place]) Active Problems:   Bipolar disorder, unspecified ([place])   DM type 2 (diabetes mellitus, type 2)- poorly controlled   HTN (hypertension)   Chronic combined systolic and diastolic CHF (congestive heart failure) (HCC)   Toe osteomyelitis, left ([place])   Bacteremia   SECONDARY DIAGNOSIS:   Past Medical History:  Diagnosis Date  . Chronic combined systolic and diastolic CHF (congestive heart failure) ([place])    a. ECHO 04/23/14 LV EF: 45% - 50%,  mild LVH, diffuse hypokinesis. LV diastolic dysfunction, high ventricular filling pressure. Mild LA dilation.  . Cocaine abuse ([place])   . Diabetes mellitus without complication ([place])   . Hypertension   . Seizures [facility])     HOSPITAL COURSE:   43 year old female with past medical history of diabetes, hypertension, substance abuse, chronic combined systolic diastolic CHF, history of seizures who presented to the hospital due to left lower extremity ulcer.  1.  Left lower extremity ulcer- due to underlying Osteomyelitis.  - Seen by podiatry and status post left great toe fifth ray amputation postop day #5 today.  -Wound cultures from surgery growing staph aureus which is MSSA, E. coli and Morganella species. - Patient was noted to be bacteremic and also had TEE which showed possible mitral valve vegetation. -Patient is being discharged on IV Ancef for additional few weeks with outpatient follow-up with podiatry and infectious disease.   2.  MSSA bacteremia- secondary to the osteomyelitis as mentioned. - TTE was negative for endocarditis, but patient underwent  TEE which showed a small mitral valve vegetation.   -As per ID patient will need 6 weeks of antibiotics.  Patient has had a PICC line placed and is currently on Ancef and stop date is on May 02, 2019.  Patient is being discharged home with home health nursing with PICC line care.  3.  Diabetes type 2 with neuropathy-  she had some episodes of hypoglycemia but those have resolved. -Patient will continue her metformin, Lantus.  4.  GERD- pt. Will continue Protonix.  5.  Diabetic neuropathy-continue gabapentin.  6.  Hyperlipidemia-continue atorvastatin.  7. HTN - cont. Metoprolol.    8. Depression - cont. Effexor.   Stable to be discharged home with [place].   DISCHARGE CONDITIONS:   Stable.   CONSULTS OBTAINED:    DRUG ALLERGIES:   Allergies  Allergen Reactions  . Albumin (Human)     unknown  . Erythromycin Rash  . Midecamycin Rash    DISCHARGE MEDICATIONS:   Allergies as of 03/28/2019      Reactions   Albumin (human)    unknown   Erythromycin Rash   Midecamycin Rash      Medication List    TAKE these medications   amitriptyline 150 MG tablet Commonly known as:  ELAVIL Take 150 mg by mouth at bedtime.   atorvastatin 20 MG tablet Commonly known as:  LIPITOR Take 20 mg by mouth at bedtime.   ceFAZolin  IVPB Commonly known as:  ANCEF Inject 2 g into the vein every 8 (eight) hours. Indication:  Bacteremia (staph aureus and strep  mitis bacteremia) mital valve endocarditis Necrotic wound great toe left ( S/p amputation) Last Day of Therapy:  05/02/19 Labs weekly on Mondays while on IV antibiotics: _X_ CBC with differential _X_ CMP _X_ ESR   _X_ Please pull PIC at completion of IV antibiotics  Fax weekly lab results  promptly to [phone]   furosemide 20 MG tablet Commonly known as:  LASIX Take 20 mg by mouth 2 (two) times a day.   gabapentin 600 MG tablet Commonly known as:  Neurontin Take 1 tablet (600 mg total) by mouth 3  (three) times daily.   hydrOXYzine 50 MG tablet Commonly known as:  ATARAX/VISTARIL Take 50 mg by mouth 2 (two) times a day.   insulin aspart 100 UNIT/ML injection Commonly known as:  novoLOG Inject 4-14 Units into the skin 3 (three) times daily as needed for high blood sugar.   insulin glargine 100 UNIT/ML injection Commonly known as:  LANTUS Inject 40 Units into the skin 2 (two) times a day.   Insulin Syringes (Disposable) U-100 0.3 ML Misc Inject 24 Units into the skin at bedtime.   meloxicam 7.5 MG tablet Commonly known as:  MOBIC Take 7.5 mg by mouth daily as needed for pain.   metFORMIN 1000 MG tablet Commonly known as:  GLUCOPHAGE Take 1,000 mg by mouth 2 (two) times daily with a meal.   metoprolol tartrate 50 MG tablet Commonly known as:  LOPRESSOR Take 50 mg by mouth 2 (two) times daily.   mirtazapine 15 MG tablet Commonly known as:  REMERON Take 7.5-15 mg by mouth at bedtime.   pantoprazole 20 MG tablet Commonly known as:  PROTONIX Take 20 mg by mouth daily.   prazosin 5 MG capsule Commonly known as:  MINIPRESS Take 5 mg by mouth at bedtime.   promethazine 12.5 MG tablet Commonly known as:  PHENERGAN Take 12.5 mg by mouth 2 (two) times daily as needed for nausea or vomiting.   QUEtiapine 400 MG tablet Commonly known as:  SEROQUEL Take 800 mg by mouth at bedtime.   Sivextro 200 MG Tabs Generic drug:  Tedizolid Phosphate Take 1 tablet by mouth daily. What changed:  when to take this   venlafaxine XR 150 MG 24 hr capsule Commonly known as:  EFFEXOR-XR Take 300 mg by mouth daily.            Home Infusion Instuctions  (From admission, onward)         Start     Ordered   03/28/19 0000  Home infusion instructions [facility] May follow [place] Dosing Protocol; May administer Cathflo as needed to maintain patency of vascular access device.; Flushing of vascular access device: per [facility] Protocol: 0.9% NaCl pre/post medica...    Question  Answer Comment  Instructions May follow [place] Dosing Protocol   Instructions May administer Cathflo as needed to maintain patency of vascular access device.   Instructions Flushing of vascular access device: per [facility] Protocol: 0.9% NaCl pre/post medication administration and prn patency; Heparin 100 u/ml, 56m for implanted ports and Heparin 10u/ml, 570mfor all other central venous catheters.   Instructions May follow [facility] Anaphylaxis Protocol for First Dose Administration in the home: 0.9% NaCl at 25-50 ml/hr to maintain IV access for protocol meds. Epinephrine 0.3 ml IV/IM PRN and Benadryl 25-50 IV/IM PRN s/s of anaphylaxis.   Instructions [facility] Infusion Coordinator (RN) to assist per patient IV care needs in the home PRN.  03/28/19 1451            DISCHARGE INSTRUCTIONS:   DIET:  Cardiac diet and Diabetic diet  DISCHARGE CONDITION:  Stable  ACTIVITY:  Activity as tolerated  Partial weight bearing on left foot with Orthotic Shoe.   OXYGEN:  Home Oxygen: No.   Oxygen Delivery: room air  DISCHARGE LOCATION:  Home with [place].    If you experience worsening of your admission symptoms, develop shortness of breath, life threatening emergency, suicidal or homicidal thoughts you must seek medical attention immediately by calling 911 or calling your MD immediately  if symptoms less severe.  You Must read complete instructions/literature along with all the possible adverse reactions/side effects for all the Medicines you take and that have been prescribed to you. Take any new Medicines after you have completely understood and accpet all the possible adverse reactions/side effects.   Please note  You were cared for by a hospitalist during your hospital stay. If you have any questions about your discharge medications or the care you received while you were in the hospital after you are discharged, you can call the unit and asked to speak with the  hospitalist on call if the hospitalist that took care of you is not available. Once you are discharged, your primary care physician will handle any further medical issues. Please note that NO REFILLS for any discharge medications will be authorized once you are discharged, as it is imperative that you return to your primary care physician (or establish a relationship with a primary care physician if you do not have one) for your aftercare needs so that they can reassess your need for medications and monitor your lab values.     Today   No acute events overnight.  Denies any shortness of breath chest pain fever chills or any other associated symptoms.  Will discharge home with home health nursing for prolonged IV antibiotics.  VITAL SIGNS:  Blood pressure (!) 87/63, pulse 91, temperature 98.8 F (37.1 C), resp. rate 16, height _0  (1.753 m), weight 95.3 kg, SpO2 99 %.  I/O:  No intake or output data in the 24 hours ending 03/28/19 1458  PHYSICAL EXAMINATION:   GENERAL:  43 y.o.-year-old patient lying in bed in no acute distress.  EYES: Pupils equal, round, reactive to light and accommodation. No scleral icterus. Extraocular muscles intact.  HEENT: Head atraumatic, normocephalic. Oropharynx and nasopharynx clear.  NECK:  Supple, no jugular venous distention. No thyroid enlargement, no tenderness.  LUNGS: Normal breath sounds bilaterally, no wheezing, rales, rhonchi. No use of accessory muscles of respiration.  CARDIOVASCULAR: S1, S2 normal. No murmurs, rubs, or gallops.  ABDOMEN: Soft, nontender, nondistended. Bowel sounds present. No organomegaly or mass.  EXTREMITIES: No cyanosis, clubbing or edema b/l. LLE dressing in place with no acute drainage noted.     NEUROLOGIC: Cranial nerves II through XII are intact. No focal Motor or sensory deficits b/l.   PSYCHIATRIC: The patient is alert and oriented x 3.  SKIN: No obvious rash, lesion, or ulcer.   DATA REVIEW:   CBC Recent Labs   Lab 03/25/19 0340  WBC 7.6  HGB 11.2*  HCT 34.1*  PLT 338    Chemistries  Recent Labs  Lab 03/25/19 0340 03/26/19 0319  NA 140  --   K 3.7  --   CL 108  --   CO2 27  --   GLUCOSE 112*  --   BUN 16  --  CREATININE 1.23* 1.04*  CALCIUM 8.4*  --     Cardiac Enzymes Recent Labs  Lab 03/21/19 1826  TROPONINI <0.03    Microbiology Results  Results for orders placed or performed during the hospital encounter of 03/21/19  SARS Coronavirus 2 [facility] order, Performed in [place] hospital lab)     Status: None   Collection Time: 03/21/19  8:16 PM  Result Value Ref Range Status   SARS Coronavirus 2 NEGATIVE NEGATIVE Final    Comment: (NOTE) If result is NEGATIVE SARS-CoV-2 target nucleic acids are NOT DETECTED. The SARS-CoV-2 RNA is generally detectable in upper and lower  respiratory specimens during the acute phase of infection. The lowest  concentration of SARS-CoV-2 viral copies this assay can detect is 250  copies / mL. A negative result does not preclude SARS-CoV-2 infection  and should not be used as the sole basis for treatment or other  patient management decisions.  A negative result may occur with  improper specimen collection / handling, submission of specimen other  than nasopharyngeal swab, presence of viral mutation(s) within the  areas targeted by this assay, and inadequate number of viral copies  (<250 copies / mL). A negative result must be combined with clinical  observations, patient history, and epidemiological information. If result is POSITIVE SARS-CoV-2 target nucleic acids are DETECTED. The SARS-CoV-2 RNA is generally detectable in upper and lower  respiratory specimens dur ing the acute phase of infection.  Positive  results are indicative of active infection with SARS-CoV-2.  Clinical  correlation with patient history and other diagnostic information is  necessary to determine patient infection status.  Positive results do  not rule out  bacterial infection or co-infection with other viruses. If result is PRESUMPTIVE POSTIVE SARS-CoV-2 nucleic acids MAY BE PRESENT.   A presumptive positive result was obtained on the submitted specimen  and confirmed on repeat testing.  While 2019 novel coronavirus  (SARS-CoV-2) nucleic acids may be present in the submitted sample  additional confirmatory testing may be necessary for epidemiological  and / or clinical management purposes  to differentiate between  SARS-CoV-2 and other Sarbecovirus currently known to infect humans.  If clinically indicated additional testing with an alternate test  methodology [phone]) is advised. The SARS-CoV-2 RNA is generally  detectable in upper and lower respiratory sp ecimens during the acute  phase of infection. The expected result is Negative. Fact Sheet for Patients:  StrictlyIdeas.no Fact Sheet for Healthcare Providers: BankingDealers.co.za This test is not yet approved or cleared by the [place] FDA and has been authorized for detection and/or diagnosis of SARS-CoV-2 by FDA under an Emergency Use Authorization (EUA).  This EUA will remain in effect (meaning this test can be used) for the duration of the COVID-19 declaration under Section 564(b)(1) of the Act, 21 U.S.C. section 360bbb-3(b)(1), unless the authorization is terminated or revoked sooner. Performed at [facility], [address] [place], [place] [address]   Blood culture (routine x 2)     Status: Abnormal   Collection Time: 03/21/19  8:16 PM  Result Value Ref Range Status   Specimen Description   Final    BLOOD RIGHT ANTECUBITAL Performed at [facility], [address] [place], [place] [address]    Special Requests   Final    BOTTLES DRAWN AEROBIC AND ANAEROBIC Blood Culture adequate volume Performed at [facility], [address], [place],  [address]    Culture  Setup Time   Final  GRAM POSITIVE COCCI IN BOTH AEROBIC AND ANAEROBIC BOTTLES CRITICAL RESULT CALLED TO, READ BACK BY AND VERIFIED WITH: KAREN HAYES ON 03/22/2019 AT 1051 QSD CRITICAL VALUE NOTED.  VALUE IS CONSISTENT WITH PREVIOUSLY REPORTED AND CALLED VALUE. Performed at [facility], [address] [place], [place] [address]    Culture (A)  Final    STAPHYLOCOCCUS AUREUS STREPTOCOCCUS MITIS/ORALIS    Report Status 03/24/2019 FINAL  Final   Organism ID, Bacteria STAPHYLOCOCCUS AUREUS  Final      Susceptibility   Staphylococcus aureus - MIC*    CIPROFLOXACIN <=0.5 SENSITIVE Sensitive     ERYTHROMYCIN <=0.25 SENSITIVE Sensitive     GENTAMICIN <=0.5 SENSITIVE Sensitive     OXACILLIN <=0.25 SENSITIVE Sensitive     TETRACYCLINE <=1 SENSITIVE Sensitive     VANCOMYCIN 1 SENSITIVE Sensitive     TRIMETH/SULFA <=10 SENSITIVE Sensitive     CLINDAMYCIN <=0.25 SENSITIVE Sensitive     RIFAMPIN <=0.5 SENSITIVE Sensitive     Inducible Clindamycin NEGATIVE Sensitive     * STAPHYLOCOCCUS AUREUS  Blood culture (routine x 2)     Status: None   Collection Time: 03/21/19  8:16 PM  Result Value Ref Range Status   Specimen Description BLOOD BLOOD RIGHT HAND  Final   Special Requests   Final    BOTTLES DRAWN AEROBIC AND ANAEROBIC Blood Culture adequate volume   Culture   Final    NO GROWTH 5 DAYS Performed at [facility], [address] [place], [place] [address]    Report Status 03/26/2019 FINAL  Final  Blood Culture ID Panel (Reflexed)     Status: Abnormal   Collection Time: 03/21/19  8:16 PM  Result Value Ref Range Status   Enterococcus species NOT DETECTED NOT DETECTED Final   Listeria monocytogenes NOT DETECTED NOT DETECTED Final   Staphylococcus species DETECTED (A) NOT DETECTED Final    Comment: CRITICAL RESULT CALLED TO, READ BACK BY AND VERIFIED WITH: KAREN HAYES ON 03/22/2019 AT 1051 QSD    Staphylococcus aureus (BCID) DETECTED (A) NOT DETECTED Final    Comment: Methicillin  (oxacillin) susceptible Staphylococcus aureus (MSSA). Preferred therapy is anti staphylococcal beta lactam antibiotic (Cefazolin or Nafcillin), unless clinically contraindicated. CRITICAL RESULT CALLED TO, READ BACK BY AND VERIFIED WITH: KAREN HAYES ON 03/22/2019 AT 1051 QSD    Methicillin resistance NOT DETECTED NOT DETECTED Final   Streptococcus species NOT DETECTED NOT DETECTED Final   Streptococcus agalactiae NOT DETECTED NOT DETECTED Final   Streptococcus pneumoniae NOT DETECTED NOT DETECTED Final   Streptococcus pyogenes NOT DETECTED NOT DETECTED Final   Acinetobacter baumannii NOT DETECTED NOT DETECTED Final   Enterobacteriaceae species NOT DETECTED NOT DETECTED Final   Enterobacter cloacae complex NOT DETECTED NOT DETECTED Final   Escherichia coli NOT DETECTED NOT DETECTED Final   Klebsiella oxytoca NOT DETECTED NOT DETECTED Final   Klebsiella pneumoniae NOT DETECTED NOT DETECTED Final   Proteus species NOT DETECTED NOT DETECTED Final   Serratia marcescens NOT DETECTED NOT DETECTED Final   Haemophilus influenzae NOT DETECTED NOT DETECTED Final   Neisseria meningitidis NOT DETECTED NOT DETECTED Final   Pseudomonas aeruginosa NOT DETECTED NOT DETECTED Final   Candida albicans NOT DETECTED NOT DETECTED Final   Candida glabrata NOT DETECTED NOT DETECTED Final   Candida krusei NOT DETECTED NOT DETECTED Final   Candida parapsilosis NOT DETECTED NOT DETECTED Final   Candida tropicalis NOT DETECTED NOT DETECTED Final    Comment: Performed at [facility], [address] [place], [place] [address]  MRSA PCR Screening     Status: None   Collection Time: 03/22/19 10:12 AM  Result Value Ref Range Status   MRSA by PCR NEGATIVE NEGATIVE Final    Comment:        The GeneXpert MRSA Assay (FDA approved for NASAL specimens only), is one component of a comprehensive MRSA colonization surveillance program. It is not intended to diagnose MRSA infection nor to guide or monitor  treatment for MRSA infections. Performed at [facility], [address] [place], [place] [address]   CULTURE, BLOOD (ROUTINE X 2) w Reflex to ID Panel     Status: None   Collection Time: 03/22/19  1:12 PM  Result Value Ref Range Status   Specimen Description BLOOD LAC  Final   Special Requests   Final    BOTTLES DRAWN AEROBIC AND ANAEROBIC Blood Culture adequate volume   Culture   Final    NO GROWTH 5 DAYS Performed at [facility], [address] [place], [place] [address]    Report Status 03/27/2019 FINAL  Final  CULTURE, BLOOD (ROUTINE X 2) w Reflex to ID Panel     Status: None   Collection Time: 03/22/19  3:34 PM  Result Value Ref Range Status   Specimen Description BLOOD BLOOD LEFT FOREARM  Final   Special Requests   Final    BOTTLES DRAWN AEROBIC AND ANAEROBIC Blood Culture results may not be optimal due to an inadequate volume of blood received in culture bottles   Culture   Final    NO GROWTH 5 DAYS Performed at [facility], [address] [name], [place] [address]    Report Status 03/27/2019 FINAL  Final  Aerobic/Anaerobic Culture (surgical/deep wound)     Status: None   Collection Time: 03/23/19  8:36 AM  Result Value Ref Range Status   Specimen Description BONE TOE LEFT GREAT  Final   Special Requests PATIENT ON FOLLOWING MAXEPIME CEFAZOLIN VANC  Final   Gram Stain   Final    RARE WBC PRESENT, PREDOMINANTLY PMN RARE GRAM POSITIVE COCCI IN PAIRS OBSERVED INTRACELLULARLY AND EXTRACELLULARLY RARE GRAM NEGATIVE RODS    Culture   Final    FEW STAPHYLOCOCCUS AUREUS FEW MORGANELLA MORGANII RARE ESCHERICHIA COLI FEW PREVOTELLA BIVIA BETA LACTAMASE POSITIVE Performed at [facility], [place] [address] [place], [place] [address]    Report Status 03/26/2019 FINAL  Final   Organism ID, Bacteria STAPHYLOCOCCUS AUREUS  Final   Organism ID, Bacteria MORGANELLA MORGANII  Final   Organism ID, Bacteria ESCHERICHIA COLI  Final       Susceptibility   Escherichia coli - MIC*    AMPICILLIN >=32 RESISTANT Resistant     CEFAZOLIN <=4 SENSITIVE Sensitive     CEFEPIME <=1 SENSITIVE Sensitive     CEFTAZIDIME <=1 SENSITIVE Sensitive     CEFTRIAXONE <=1 SENSITIVE Sensitive     CIPROFLOXACIN <=0.25 SENSITIVE Sensitive     GENTAMICIN <=1 SENSITIVE Sensitive     IMIPENEM <=0.25 SENSITIVE Sensitive     TRIMETH/SULFA >=320 RESISTANT Resistant     AMPICILLIN/SULBACTAM 16 INTERMEDIATE Intermediate     PIP/TAZO <=4 SENSITIVE Sensitive     Extended ESBL NEGATIVE Sensitive     * RARE ESCHERICHIA COLI   Morganella morganii - MIC*    AMPICILLIN >=32 RESISTANT Resistant     CEFAZOLIN >=64 RESISTANT Resistant     CEFEPIME <=1 SENSITIVE Sensitive     CEFTAZIDIME <=1 SENSITIVE Sensitive  CEFTRIAXONE <=1 SENSITIVE Sensitive     CIPROFLOXACIN <=0.25 SENSITIVE Sensitive     GENTAMICIN <=1 SENSITIVE Sensitive     IMIPENEM 8 INTERMEDIATE Intermediate     TRIMETH/SULFA <=20 SENSITIVE Sensitive     AMPICILLIN/SULBACTAM >=32 RESISTANT Resistant     PIP/TAZO <=4 SENSITIVE Sensitive     * FEW MORGANELLA MORGANII   Staphylococcus aureus - MIC*    CIPROFLOXACIN <=0.5 SENSITIVE Sensitive     ERYTHROMYCIN <=0.25 SENSITIVE Sensitive     GENTAMICIN <=0.5 SENSITIVE Sensitive     OXACILLIN 0.5 SENSITIVE Sensitive     TETRACYCLINE <=1 SENSITIVE Sensitive     VANCOMYCIN 1 SENSITIVE Sensitive     TRIMETH/SULFA <=10 SENSITIVE Sensitive     CLINDAMYCIN <=0.25 SENSITIVE Sensitive     RIFAMPIN <=0.5 SENSITIVE Sensitive     Inducible Clindamycin NEGATIVE Sensitive     * FEW STAPHYLOCOCCUS AUREUS  CULTURE, BLOOD (ROUTINE X 2) w Reflex to ID Panel     Status: None (Preliminary result)   Collection Time: 03/28/19 12:30 AM  Result Value Ref Range Status   Specimen Description BLOOD RIGHT HAND  Final   Special Requests   Final    BOTTLES DRAWN AEROBIC AND ANAEROBIC Blood Culture adequate volume   Culture   Final    NO GROWTH < 12 HOURS Performed at  [facility], [address] [place], [place] [address]    Report Status PENDING  Incomplete  CULTURE, BLOOD (ROUTINE X 2) w Reflex to ID Panel     Status: None (Preliminary result)   Collection Time: 03/28/19 12:43 AM  Result Value Ref Range Status   Specimen Description BLOOD LEFT FOREARM  Final   Special Requests   Final    BOTTLES DRAWN AEROBIC AND ANAEROBIC Blood Culture adequate volume   Culture   Final    NO GROWTH < 12 HOURS Performed at [facility], [address], [place], [place] [address]    Report Status PENDING  Incomplete    RADIOLOGY:  No results found.    Management plans discussed with the patient, family and they are in agreement.  CODE STATUS:     Code Status Orders  (From admission, onward)         Start     Ordered   03/21/19 2225  Full code  Continuous     03/21/19 2224        TOTAL TIME TAKING CARE OF THIS PATIENT: 40 minutes.    Henreitta Leber M.D on 03/28/2019 at 2:58 PM  Between 7am to 6pm - Pager - [phone]  After 6pm go to [email or web address] - Proofreader  [facility]  Office  [phone]  CC: Primary care physician; Lacie Draft, NP

## 2019-03-28 NOTE — TOC Progression Note (Signed)
Transition of Care [facility]) - Progression Note    Patient Details  Name: [name] MRN: [id number] Date of Birth: [birth year]  Transition of Care [facility]) CM/SW [place], RN Phone Number: 03/28/2019, 10:35 AM  Clinical Narrative:    Damaris Schooner with Pam with [facility], She has accepted the patient  The patient will be staying with the aunt Serita Sheller at [place] [address], She will be getting IV ABX Ancef for 6 weeks, [place] will be doing the infusion and the PICC line dressing care, this is set up by [facility]. Patient to DC today, the Aunt will come in for Infusion education and training   Expected Discharge Plan: [place] Barriers to Discharge: Continued Medical Work up  Expected Discharge Plan and Services Expected Discharge Plan: [place] arrangements for the past 2 months: Single Family Home Expected Discharge Date: 03/25/19                                     Social Determinants of Health (SDOH) Interventions    Readmission Risk Interventions No flowsheet data found.

## 2019-03-28 NOTE — Progress Notes (Signed)
Dressing changed today.  Incision margins closing in nicely.  No drainage at all.  No tension to the incision site.  From podiatry standpoint patient is stable.  Did have vegetation on TEE.  Will be on IV antibiotics upon discharge.  To stay in the postop shoe and minimize ambulation to this foot.  Follow-up wit Dr. Elvina Mattes in 1 to 2 weeks

## 2019-04-01 LAB — NOVEL CORONAVIRUS, NAA (HOSP ORDER, SEND-OUT TO REF LAB; TAT 18-24 HRS): SARS-CoV-2, NAA: NOT DETECTED

## 2019-04-02 LAB — CULTURE, BLOOD (ROUTINE X 2)
Culture: NO GROWTH
Culture: NO GROWTH
Special Requests: ADEQUATE
Special Requests: ADEQUATE

## 2019-04-05 ENCOUNTER — Telehealth (INDEPENDENT_AMBULATORY_CARE_PROVIDER_SITE_OTHER): Payer: Self-pay

## 2019-04-05 NOTE — Telephone Encounter (Signed)
Spoke with the patient regarding having cardiac clearance and her surgery with Dr. Delana Meyer. Per the system on her referral for clearance is a note of patient refusal, per the patient she is not refusing and will call the office on 04/08/2019 to get an appt for cardiac clearance.

## 2019-04-09 ENCOUNTER — Telehealth: Payer: Self-pay | Admitting: Infectious Diseases

## 2019-04-16 ENCOUNTER — Telehealth: Payer: Self-pay | Admitting: Licensed Clinical Social Worker

## 2019-04-16 NOTE — Telephone Encounter (Signed)
Patient called stating that she had redness and a small amount of  pus along the PICC line entry site. I called and spoke to [facility] at [place] and they will send a RN to evaluate and recommend if she will need to have PICC replaced.

## 2019-04-23 ENCOUNTER — Other Ambulatory Visit: Payer: Self-pay

## 2019-04-23 ENCOUNTER — Encounter: Payer: Self-pay | Admitting: Infectious Diseases

## 2019-04-23 ENCOUNTER — Ambulatory Visit: Payer: Medicaid Other | Attending: Infectious Diseases | Admitting: Infectious Diseases

## 2019-04-23 VITALS — BP 85/70 | HR 90 | Temp 98.6°F | Ht 69.0 in | Wt 210.0 lb

## 2019-04-23 DIAGNOSIS — I330 Acute and subacute infective endocarditis: Secondary | ICD-10-CM

## 2019-04-23 DIAGNOSIS — I10 Essential (primary) hypertension: Secondary | ICD-10-CM

## 2019-04-23 DIAGNOSIS — I96 Gangrene, not elsewhere classified: Secondary | ICD-10-CM

## 2019-04-23 DIAGNOSIS — Z95828 Presence of other vascular implants and grafts: Secondary | ICD-10-CM

## 2019-04-23 DIAGNOSIS — B9561 Methicillin susceptible Staphylococcus aureus infection as the cause of diseases classified elsewhere: Secondary | ICD-10-CM

## 2019-04-23 DIAGNOSIS — F1490 Cocaine use, unspecified, uncomplicated: Secondary | ICD-10-CM

## 2019-04-23 DIAGNOSIS — Z888 Allergy status to other drugs, medicaments and biological substances status: Secondary | ICD-10-CM

## 2019-04-23 DIAGNOSIS — F515 Nightmare disorder: Secondary | ICD-10-CM

## 2019-04-23 DIAGNOSIS — E1152 Type 2 diabetes mellitus with diabetic peripheral angiopathy with gangrene: Secondary | ICD-10-CM

## 2019-04-23 DIAGNOSIS — F4310 Post-traumatic stress disorder, unspecified: Secondary | ICD-10-CM

## 2019-04-23 DIAGNOSIS — F339 Major depressive disorder, recurrent, unspecified: Secondary | ICD-10-CM

## 2019-04-23 DIAGNOSIS — F17210 Nicotine dependence, cigarettes, uncomplicated: Secondary | ICD-10-CM

## 2019-04-23 DIAGNOSIS — Z79899 Other long term (current) drug therapy: Secondary | ICD-10-CM

## 2019-04-23 DIAGNOSIS — Z792 Long term (current) use of antibiotics: Secondary | ICD-10-CM

## 2019-04-23 DIAGNOSIS — Z881 Allergy status to other antibiotic agents status: Secondary | ICD-10-CM

## 2019-04-23 DIAGNOSIS — F419 Anxiety disorder, unspecified: Secondary | ICD-10-CM

## 2019-04-23 DIAGNOSIS — Z981 Arthrodesis status: Secondary | ICD-10-CM

## 2019-04-23 DIAGNOSIS — R7881 Bacteremia: Secondary | ICD-10-CM

## 2019-04-23 DIAGNOSIS — I959 Hypotension, unspecified: Secondary | ICD-10-CM

## 2019-04-23 DIAGNOSIS — Z89412 Acquired absence of left great toe: Secondary | ICD-10-CM

## 2019-04-23 NOTE — Progress Notes (Signed)
NAME: [name]  DOB: 17-Aug-1976  MRN: [id number]  Date/Time: 04/23/2019 12:38 PM   Subjective:   ?Follow-up after recent hospitalization. [name] is a 43 y.o. female with a history of diabetes mellitus, cocaine use, hypertension was recently in [facility] between 03/21/19-  03/28/2019 for left great toe infection.  She was found to have staph aureus bacteremia.  She also had a necrotic left great toe which was amputated.  She underwent a TEE which showed a mitral valve vegetation.  She was discharged home on IV cefazolin to complete 6 weeks on 05/02/2019. Patient is here for follow-up and is doing well. She has no fever or chills or nausea or vomiting or rash. She is on multiple medications for depression and hypertension. Past Medical History:  Diagnosis Date  . Chronic combined systolic and diastolic CHF (congestive heart failure) ([place])    a. ECHO 04/23/14 LV EF: 45% - 50%,  mild LVH, diffuse hypokinesis. LV diastolic dysfunction, high ventricular filling pressure. Mild LA dilation.  . Cocaine abuse ([place])   . Diabetes mellitus without complication ([place])   . Hypertension   . Seizures ([place])     Past Surgical History:  Procedure Laterality Date  . AMPUTATION TOE Left 03/23/2019   Procedure: AMPUTATION TOE GREAT LEFT TOE;  Surgeon: [name] Patricia, DPM;  Location: [facility] ORS;  Service: Podiatry;  Laterality: Left;  . CHOLECYSTECTOMY  2005  . LOWER EXTREMITY ANGIOGRAPHY Left 03/12/2019   Procedure: LOWER EXTREMITY ANGIOGRAPHY;  Surgeon: Katha Cabal, MD;  Location: [place] CV LAB;  Service: Cardiovascular;  Laterality: Left;  . TEE WITHOUT CARDIOVERSION N/A 03/26/2019   Procedure: TRANSESOPHAGEAL ECHOCARDIOGRAM (TEE);  Surgeon: Nelva Bush, MD;  Location: [facility] ORS;  Service: Cardiovascular;  Laterality: N/A;  . TUBAL LIGATION      Social History   Socioeconomic History  . Marital status: Single    Spouse name: Not on file  . Number of children: 0  . Years of education:  58  . Highest education level: Not on file  Occupational History  . Occupation: Unemployed  Social Needs  . Financial resource strain: Not on file  . Food insecurity    Worry: Not on file    Inability: Not on file  . Transportation needs    Medical: Not on file    Non-medical: Not on file  Tobacco Use  . Smoking status: Current Every Day Smoker    Packs/day: 1.00    Years: 28.00    Pack years: 28.00  . Smokeless tobacco: Never Used  Substance and Sexual Activity  . Alcohol use: Not Currently    Alcohol/week: 0.0 standard drinks    Comment: Quit drinking 8 months ago (09/01/15)  . Drug use: Not Currently    Types: Cocaine  . Sexual activity: Yes  Lifestyle  . Physical activity    Days per week: Not on file    Minutes per session: Not on file  . Stress: Not on file  Relationships  . Social Herbalist on phone: Not on file    Gets together: Not on file    Attends religious service: Not on file    Active member of club or organization: Not on file    Attends meetings of clubs or organizations: Not on file    Relationship status: Not on file  . Intimate partner violence    Fear of current or ex partner: Not on file    Emotionally abused: Not on file  Physically abused: Not on file    Forced sexual activity: Not on file  Other Topics Concern  . Not on file  Social History Narrative   Lives at home mother.    Caffeine use: Soda (drinks 3L per day)    Family History  Problem Relation Age of Onset  . Hypertension Other   . Drug abuse Mother   . Seizures Neg Hx    Allergies  Allergen Reactions  . Albumin (Human)     unknown  . Erythromycin Rash  . Midecamycin Rash   ? Current Outpatient Medications  Medication Sig Dispense Refill  . amitriptyline (ELAVIL) 150 MG tablet Take 150 mg by mouth at bedtime.    Marland Kitchen atorvastatin (LIPITOR) 20 MG tablet Take 20 mg by mouth at bedtime.     Marland Kitchen ceFAZolin (ANCEF) IVPB Inject 2 g into the vein every 8 (eight) hours.  Indication:  Bacteremia (staph aureus and strep mitis bacteremia) mital valve endocarditis Necrotic wound great toe left ( S/p amputation) Last Day of Therapy:  05/02/19 Labs weekly on Mondays while on IV antibiotics: _X_ CBC with differential _X_ CMP _X_ ESR   _X_ Please pull PIC at completion of IV antibiotics  Fax weekly lab results  promptly to [phone] Units 0  . furosemide (LASIX) 20 MG tablet Take 20 mg by mouth 2 (two) times a day.    . gabapentin (NEURONTIN) 600 MG tablet Take 1 tablet (600 mg total) by mouth 3 (three) times daily. 90 tablet 3  . glipiZIDE (GLUCOTROL) 10 MG tablet Take 1 tablet by mouth every morning.    . hydrOXYzine (ATARAX/VISTARIL) 50 MG tablet Take 50 mg by mouth 2 (two) times a day.   0  . insulin aspart (NOVOLOG) 100 UNIT/ML injection Inject 4-14 Units into the skin 3 (three) times daily as needed for high blood sugar.    . insulin glargine (LANTUS) 100 UNIT/ML injection Inject 40 Units into the skin 2 (two) times a day.    . Insulin Syringes, Disposable, U-100 0.3 ML MISC Inject 24 Units into the skin at bedtime. 100 each 0  . meloxicam (MOBIC) 7.5 MG tablet Take 7.5 mg by mouth daily as needed for pain.   1  . metFORMIN (GLUCOPHAGE) 1000 MG tablet Take 1,000 mg by mouth 2 (two) times daily with a meal.    . metoprolol tartrate (LOPRESSOR) 50 MG tablet Take 25 mg by mouth 2 (two) times daily.     . mirtazapine (REMERON) 15 MG tablet Take 7.5-15 mg by mouth at bedtime.    . pantoprazole (PROTONIX) 20 MG tablet Take 20 mg by mouth daily.     . prazosin (MINIPRESS) 5 MG capsule Take 5 mg by mouth at bedtime.    . promethazine (PHENERGAN) 12.5 MG tablet Take 12.5 mg by mouth 2 (two) times daily as needed for nausea or vomiting.   3  . QUEtiapine (SEROQUEL) 400 MG tablet Take 800 mg by mouth at bedtime.    Marland Kitchen venlafaxine XR (EFFEXOR-XR) 150 MG 24 hr capsule Take 300 mg by mouth daily.   3  . SIVEXTRO 200 MG TABS Take 1 tablet by mouth daily. (Patient  not taking: Reported on 04/23/2019) 30 tablet 0   No current facility-administered medications for this visit.      Abtx:  Anti-infectives (From admission, onward)   None      REVIEW OF SYSTEMS:  Const: negative fever, negative chills, negative weight loss Eyes: negative diplopia or visual  changes, negative eye pain ENT: negative coryza, negative sore throat Resp: negative cough, hemoptysis, dyspnea Cards: negative for chest pain, palpitations, lower extremity edema GU: Does not pass urine frequently  GI: Negative for abdominal pain, diarrhea, bleeding, constipation Skin: negative for rash and pruritus Heme: negative for easy bruising and gum/nose bleeding MS: Generalized weakness  Neurolo:negative for headaches, some dizziness, vertigo, memory problems  Psych: Anxiety and depression and PTSD Endocrine: negative for thyroid, diabetes Allergy/Immunology-as noted above: Objective:  VITALS:  BP (!) 85/70 (BP Location: Right Arm, Patient Position: Sitting, Cuff Size: Normal)   Pulse 90   Temp 98.6 F (37 C) (Oral)   Ht 5' 9" (1.753 m)   Wt 210 lb (95.3 kg)   BMI 31.01 kg/m   92/70  PHYSICAL EXAM: In wheelchair General: Alert, cooperative, no distress, appears stated age.  Head: Normocephalic, without obvious abnormality, atraumatic. Eyes: Conjunctivae clear, anicteric sclerae. Pupils are equal Patient is Masked hence did not examine the oral cavity Neck: Supple, symmetrical, no adenopathy, thyroid: non tender no carotid bruit and no JVD. Lungs: Clear to auscultation bilaterally. No Wheezing or Rhonchi. No rales. Heart: Regular rate and rhythm, no murmur, rub or gallop. Abdomen: Did not examine as she is in wheelchair Extremities: Right PICC line site fine. Left great toe amputated.  Surgical site clean and healed very well       skin: No rashes or lesions. Or bruising Lymph: Cervical, supraclavicular normal. Neurologic: Grossly non-focal, involuntary twitching  Pertinent Labs Lab Results CBC    Component Value Date/Time   WBC 7.6 03/25/2019 0340   RBC 3.71 (L) 03/25/2019 0340   HGB 11.2 (L) 03/25/2019 0340   HCT 34.1 (L) 03/25/2019 0340   PLT 338 03/25/2019 0340   MCV 91.9 03/25/2019 0340   MCH 30.2 03/25/2019 0340   MCHC 32.8 03/25/2019 0340   RDW 13.7 03/25/2019 0340   LYMPHSABS 1.3 03/21/2019 1826   MONOABS 1.0 03/21/2019 1826   EOSABS 0.1 03/21/2019 1826   BASOSABS 0.0 03/21/2019 1826    CMP Latest Ref Rng & Units 03/26/2019 03/25/2019 03/24/2019  Glucose 70 - 99 mg/dL - 112(H) -  BUN 6 - 20 mg/dL - 16 -  Creatinine 0.44 - 1.00 mg/dL 1.04(H) 1.23(H) 1.11(H)  Sodium 135 - 145 mmol/L - 140 -  Potassium 3.5 - 5.1 mmol/L - 3.7 -  Chloride 98 - 111 mmol/L - 108 -  CO2 22 - 32 mmol/L - 27 -  Calcium 8.9 - 10.3 mg/dL - 8.4(L) -  Total Protein 6.0 - 8.3 g/dL - - -  Total Bilirubin 0.3 - 1.2 mg/dL - - -  Alkaline Phos 39 - 117 U/L - - -  AST 0 - 37 U/L - - -  ALT 0 - 35 U/L - - -    ? Impression/Recommendation ?Left foot infection with great toe necrosis in  patient with DM  status post toe amputation.  And the site is healed very well.  Staph  aureus bacteremia along with left mitral valve endocarditis.  Also the source of the infection was from the left foot.- Currently on cefazolin.she will complete 6 weeks of treatment on 05/02/2019.  The PICC line will be removed after that.  H/o cocaine use but has not done any since the last 3 months.  Yesterday urine drug screen was sent.  Results pending  DM- not well controlled  Hardware left ankle   Low blood pressure.  Patient currently takes many medications for hypertension.  She is  on furosemide, metoprolol, she also takes prazosin for PTSD and nightmares.  Prescribed by her psychiatrist.  Asked her to hold the prazosin until she talks to her providers. She has to follow-up with her PCP regarding the medications.  She is also on multiple antidepressants which can also cause low  blood pressure.  She is on mirtazapine, quetiapine, Effexor.  Her blood pressure initially was 85/70  but on rechecking it was 92/70  Patient says she is okay and does not want to go to the ED.   Discussed with patient in great detail  ? ________________________________________ Note:  This document was prepared using Dragon voice recognition software and may include unintentional dictation errors.

## 2019-04-23 NOTE — Patient Instructions (Addendum)
You are here for follow up after recent hospitalization for staph bacteremia and staph infection of the left foot and you underwent left great toe amputation- you also were notd to have a small vegetation on your mitral valve  You have been on cefazolin and your last day is 05/02/19. The PICC can be removed after that. Your BP today was low- iniitally 85/70 and then 92/75, HR 99. You did not want to go to ED as you felt okay You are on many medications ( anti depression meds, antihypertensive meds) that can decrease you BP- I would recommend you speak with your PCP as soon as possible and come off some of the meds- I would recommend you stop prazosin until you go to see her as that could surely reduce the BP

## 2019-04-29 ENCOUNTER — Encounter: Payer: Self-pay | Admitting: Infectious Diseases

## 2019-04-30 ENCOUNTER — Inpatient Hospital Stay: Payer: Medicaid Other | Admitting: Infectious Diseases

## 2019-06-25 DIAGNOSIS — Z9181 History of falling: Secondary | ICD-10-CM | POA: Insufficient documentation

## 2019-06-27 DIAGNOSIS — Z89412 Acquired absence of left great toe: Secondary | ICD-10-CM | POA: Insufficient documentation

## 2019-07-26 ENCOUNTER — Encounter: Payer: Self-pay | Admitting: Infectious Diseases

## 2019-08-15 ENCOUNTER — Other Ambulatory Visit: Payer: Self-pay

## 2019-08-15 ENCOUNTER — Inpatient Hospital Stay
Admission: AD | Admit: 2019-08-15 | Discharge: 2019-08-18 | DRG: 617 | Disposition: A | Discharge status: Home Health | Payer: Medicaid Other | Source: Ambulatory Visit | Attending: Internal Medicine | Admitting: Internal Medicine

## 2019-08-15 DIAGNOSIS — J449 Chronic obstructive pulmonary disease, unspecified: Secondary | ICD-10-CM | POA: Diagnosis present

## 2019-08-15 DIAGNOSIS — E1151 Type 2 diabetes mellitus with diabetic peripheral angiopathy without gangrene: Secondary | ICD-10-CM | POA: Diagnosis present

## 2019-08-15 DIAGNOSIS — M869 Osteomyelitis, unspecified: Secondary | ICD-10-CM | POA: Diagnosis present

## 2019-08-15 DIAGNOSIS — Z20828 Contact with and (suspected) exposure to other viral communicable diseases: Secondary | ICD-10-CM | POA: Diagnosis present

## 2019-08-15 DIAGNOSIS — E1169 Type 2 diabetes mellitus with other specified complication: Secondary | ICD-10-CM | POA: Diagnosis present

## 2019-08-15 DIAGNOSIS — N183 Chronic kidney disease, stage 3 unspecified: Secondary | ICD-10-CM | POA: Diagnosis present

## 2019-08-15 DIAGNOSIS — E785 Hyperlipidemia, unspecified: Secondary | ICD-10-CM | POA: Diagnosis present

## 2019-08-15 DIAGNOSIS — E1122 Type 2 diabetes mellitus with diabetic chronic kidney disease: Secondary | ICD-10-CM | POA: Diagnosis present

## 2019-08-15 DIAGNOSIS — F329 Major depressive disorder, single episode, unspecified: Secondary | ICD-10-CM | POA: Diagnosis present

## 2019-08-15 DIAGNOSIS — K219 Gastro-esophageal reflux disease without esophagitis: Secondary | ICD-10-CM | POA: Diagnosis present

## 2019-08-15 DIAGNOSIS — Z79899 Other long term (current) drug therapy: Secondary | ICD-10-CM

## 2019-08-15 DIAGNOSIS — I5042 Chronic combined systolic (congestive) and diastolic (congestive) heart failure: Secondary | ICD-10-CM | POA: Diagnosis present

## 2019-08-15 DIAGNOSIS — Z89412 Acquired absence of left great toe: Secondary | ICD-10-CM

## 2019-08-15 DIAGNOSIS — Z23 Encounter for immunization: Secondary | ICD-10-CM | POA: Diagnosis not present

## 2019-08-15 DIAGNOSIS — F17210 Nicotine dependence, cigarettes, uncomplicated: Secondary | ICD-10-CM | POA: Diagnosis present

## 2019-08-15 DIAGNOSIS — E1142 Type 2 diabetes mellitus with diabetic polyneuropathy: Secondary | ICD-10-CM | POA: Diagnosis present

## 2019-08-15 DIAGNOSIS — Z794 Long term (current) use of insulin: Secondary | ICD-10-CM

## 2019-08-15 DIAGNOSIS — E11621 Type 2 diabetes mellitus with foot ulcer: Secondary | ICD-10-CM | POA: Diagnosis present

## 2019-08-15 DIAGNOSIS — R569 Unspecified convulsions: Secondary | ICD-10-CM | POA: Diagnosis present

## 2019-08-15 DIAGNOSIS — I130 Hypertensive heart and chronic kidney disease with heart failure and stage 1 through stage 4 chronic kidney disease, or unspecified chronic kidney disease: Secondary | ICD-10-CM | POA: Diagnosis present

## 2019-08-15 DIAGNOSIS — Z888 Allergy status to other drugs, medicaments and biological substances status: Secondary | ICD-10-CM

## 2019-08-15 DIAGNOSIS — F419 Anxiety disorder, unspecified: Secondary | ICD-10-CM | POA: Diagnosis present

## 2019-08-15 DIAGNOSIS — Z09 Encounter for follow-up examination after completed treatment for conditions other than malignant neoplasm: Secondary | ICD-10-CM

## 2019-08-15 DIAGNOSIS — L03116 Cellulitis of left lower limb: Secondary | ICD-10-CM | POA: Diagnosis present

## 2019-08-15 DIAGNOSIS — I059 Rheumatic mitral valve disease, unspecified: Secondary | ICD-10-CM | POA: Diagnosis present

## 2019-08-15 DIAGNOSIS — M86172 Other acute osteomyelitis, left ankle and foot: Secondary | ICD-10-CM | POA: Diagnosis present

## 2019-08-15 DIAGNOSIS — F1410 Cocaine abuse, uncomplicated: Secondary | ICD-10-CM | POA: Diagnosis present

## 2019-08-15 DIAGNOSIS — N179 Acute kidney failure, unspecified: Secondary | ICD-10-CM | POA: Diagnosis present

## 2019-08-15 DIAGNOSIS — Z8249 Family history of ischemic heart disease and other diseases of the circulatory system: Secondary | ICD-10-CM

## 2019-08-15 DIAGNOSIS — Z7982 Long term (current) use of aspirin: Secondary | ICD-10-CM

## 2019-08-15 DIAGNOSIS — Z813 Family history of other psychoactive substance abuse and dependence: Secondary | ICD-10-CM | POA: Diagnosis not present

## 2019-08-15 DIAGNOSIS — Z0184 Encounter for antibody response examination: Secondary | ICD-10-CM

## 2019-08-15 HISTORY — DX: Chronic obstructive pulmonary disease, unspecified: J44.9

## 2019-08-15 LAB — URINALYSIS, COMPLETE (UACMP) WITH MICROSCOPIC
Bilirubin Urine: NEGATIVE
Glucose, UA: >=500 mg/dL — AB
Hgb urine dipstick: NEGATIVE
Ketones, ur: NEGATIVE mg/dL
Nitrite: NEGATIVE
Protein, ur: NEGATIVE mg/dL
Specific Gravity, Urine: 1.015 (ref 1.005–1.030)
pH: 5 (ref 5.0–8.0)

## 2019-08-15 LAB — URINE DRUG SCREEN, QUALITATIVE (ARMC ONLY)
Amphetamines, Ur Screen: NOT DETECTED
Barbiturates, Ur Screen: NOT DETECTED
Benzodiazepine, Ur Scrn: NOT DETECTED
Cannabinoid 50 Ng, Ur ~~LOC~~: NOT DETECTED
Cocaine Metabolite,Ur ~~LOC~~: NOT DETECTED
MDMA (Ecstasy)Ur Screen: NOT DETECTED
Methadone Scn, Ur: NOT DETECTED
Opiate, Ur Screen: NOT DETECTED
Phencyclidine (PCP) Ur S: NOT DETECTED
Tricyclic, Ur Screen: POSITIVE — AB

## 2019-08-15 LAB — GLUCOSE, CAPILLARY: Glucose-Capillary: 305 mg/dL — ABNORMAL HIGH (ref 70–99)

## 2019-08-15 LAB — CREATININE, SERUM
Creatinine, Ser: 2.02 mg/dL — ABNORMAL HIGH (ref 0.44–1.00)
GFR calc Af Amer: 34 mL/min — ABNORMAL LOW (ref >60)
GFR calc non Af Amer: 29 mL/min — ABNORMAL LOW (ref >60)

## 2019-08-15 LAB — SAR COV2 SEROLOGY (COVID19)AB(IGG),IA: SARS-CoV-2 Ab, IgG: NONREACTIVE

## 2019-08-15 MED ORDER — PNEUMOCOCCAL VAC POLYVALENT 25 MCG/0.5ML IJ INJ
0.5000 mL | INJECTION | INTRAMUSCULAR | Status: AC
  Administered 2019-08-16: 0.5 mL via INTRAMUSCULAR
  Filled 2019-08-15: qty 0.5

## 2019-08-15 MED ORDER — SODIUM CHLORIDE 0.9 % IV SOLN
2.0000 g | Freq: Two times a day (BID) | INTRAVENOUS | Status: DC
  Administered 2019-08-16 – 2019-08-18 (×5): 2 g via INTRAVENOUS
  Filled 2019-08-15 (×6): qty 2

## 2019-08-15 MED ORDER — INSULIN ASPART 100 UNIT/ML ~~LOC~~ SOLN
0.0000 [IU] | Freq: Three times a day (TID) | SUBCUTANEOUS | Status: DC
  Administered 2019-08-16: 2 [IU] via SUBCUTANEOUS
  Administered 2019-08-16: 3 [IU] via SUBCUTANEOUS
  Filled 2019-08-15 (×2): qty 1

## 2019-08-15 MED ORDER — CHLORHEXIDINE GLUCONATE 4 % EX LIQD
60.0000 mL | Freq: Once | CUTANEOUS | Status: AC
  Administered 2019-08-15: 4 via TOPICAL

## 2019-08-15 MED ORDER — POVIDONE-IODINE 10 % EX SWAB: 2.0000 "application " | Freq: Once | CUTANEOUS | Status: DC

## 2019-08-15 MED ORDER — SODIUM CHLORIDE 0.9 % IV SOLN
2.0000 g | Freq: Once | INTRAVENOUS | Status: AC
  Administered 2019-08-15: 2 g via INTRAVENOUS
  Filled 2019-08-15: qty 2

## 2019-08-15 MED ORDER — VANCOMYCIN HCL IN DEXTROSE 1-5 GM/200ML-% IV SOLN: 1000.0000 mg | Freq: Once | INTRAVENOUS | Status: DC

## 2019-08-15 MED ORDER — VANCOMYCIN HCL 10 G IV SOLR
2000.0000 mg | Freq: Once | INTRAVENOUS | Status: AC
  Administered 2019-08-15: 2000 mg via INTRAVENOUS
  Filled 2019-08-15: qty 2000

## 2019-08-15 MED ORDER — VANCOMYCIN HCL 1.25 G IV SOLR: 1250.0000 mg | INTRAVENOUS | Status: DC

## 2019-08-15 MED ORDER — SODIUM CHLORIDE 0.9 % IV SOLN
INTRAVENOUS | Status: DC
  Administered 2019-08-15 – 2019-08-16 (×3): via INTRAVENOUS

## 2019-08-15 MED ORDER — CHLORHEXIDINE GLUCONATE CLOTH 2 % EX PADS
6.0000 | MEDICATED_PAD | Freq: Every day | CUTANEOUS | Status: DC
  Administered 2019-08-16 – 2019-08-18 (×3): 6 via TOPICAL

## 2019-08-15 MED ORDER — INSULIN ASPART 100 UNIT/ML ~~LOC~~ SOLN
0.0000 [IU] | Freq: Every day | SUBCUTANEOUS | Status: DC
  Administered 2019-08-15: 4 [IU] via SUBCUTANEOUS
  Administered 2019-08-16: 2 [IU] via SUBCUTANEOUS
  Filled 2019-08-15 (×2): qty 1

## 2019-08-15 NOTE — H&P (Addendum)
[place] [facility] [place] NAME: [name]    MR#:  [id number]  DATE OF BIRTH:  [birth year]  DATE OF ADMISSION:  08/15/2019  PRIMARY CARE PHYSICIAN: Lacie Draft, NP   REQUESTING/REFERRING PHYSICIAN: Yvetta Coder DPM  CHIEF COMPLAINT:  No chief complaint on file. Left fifth toe osteomyelitis  HISTORY OF PRESENT ILLNESS:  43 y.o. 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  Patient was recently discharged from the hospital on 03/28/2019 following admission for left great toe osteomyelitis s/p amputation and staph aureus bacteremia with mitral valve endocarditis. She followed with her podiatry in the clinic today and had x-ray of the left foot which showed acute osteomyelitis of the left foot.  Patient states that she has had percent drainage in her left fifth toe for 3 days.  Also noted swelling and redness without pain.  Patient denies fevers or chills.   On arrival to the unit, she was afebrile with blood pressure 90/66 mm Hg and pulse rate 97 beats/min. There were no focal neurological deficits; she was alert and oriented x4, and denied any pain. She is being admitted for antibiotics treatment and amputation tomorrow by Dr. Luana Shu.  PAST MEDICAL HISTORY:   Past Medical History:  Diagnosis Date  . Chronic combined systolic and diastolic CHF (congestive heart failure) ([place])    a. ECHO 04/23/14 LV EF: 45% - 50%,  mild LVH, diffuse hypokinesis. LV diastolic dysfunction, high ventricular filling pressure. Mild LA dilation.  . Cocaine abuse ([place])   . COPD (chronic obstructive pulmonary disease) ([place])   . Diabetes mellitus without complication ([place])   .  Heroin overdose ([place]) 10/2018  . Hypertension   . Nicotine dependence with current use   . Seizures ([place])     PAST SURGICAL HISTORY:   Past Surgical History:  Procedure Laterality Date  . AMPUTATION TOE Left 03/23/2019   Procedure: AMPUTATION TOE GREAT LEFT TOE;  Surgeon: [name] Patricia, DPM;  Location: [facility] ORS;  Service: Podiatry;  Laterality: Left;  . CHOLECYSTECTOMY  2005  . LOWER EXTREMITY ANGIOGRAPHY Left 03/12/2019   Procedure: LOWER EXTREMITY ANGIOGRAPHY;  Surgeon: Katha Cabal, MD;  Location: [place] CV LAB;  Service: Cardiovascular;  Laterality: Left;  . TEE WITHOUT CARDIOVERSION N/A 03/26/2019   Procedure: TRANSESOPHAGEAL ECHOCARDIOGRAM (TEE);  Surgeon: Nelva Bush, MD;  Location: [facility] ORS;  Service: Cardiovascular;  Laterality: N/A;  . TUBAL LIGATION      SOCIAL HISTORY:   Social History   Tobacco Use  . Smoking status: Current Every Day Smoker    Packs/day: 1.00    Years: 28.00    Pack years: 28.00    Types: Cigarettes  . Smokeless tobacco: Never Used  Substance Use Topics  . Alcohol use: Not Currently    Alcohol/week: 0.0 standard drinks    Comment: Quit drinking 11/16 per pt    FAMILY HISTORY:   Family History  Problem Relation Age of Onset  . Hypertension Other   . Drug abuse Mother   . Seizures Neg Hx     DRUG ALLERGIES:   Allergies  Allergen Reactions  . Albumin (Human)     unknown  . Erythromycin Rash  . Midecamycin Rash    REVIEW  OF SYSTEMS:   Review of Systems  Constitutional: Negative for chills, fever, malaise/fatigue and weight loss.  HENT: Negative for congestion, hearing loss and sore throat.   Eyes: Negative for blurred vision and double vision.  Respiratory: Negative for cough, shortness of breath and wheezing.   Cardiovascular: Negative for chest pain, palpitations, orthopnea and leg swelling.  Gastrointestinal: Negative for abdominal pain, diarrhea, nausea and vomiting.  Genitourinary: Negative for dysuria  and urgency.  Musculoskeletal: Negative for myalgias.       Left foot swelling and redness  Skin: Negative for rash.  Neurological: Negative for dizziness, sensory change, speech change, focal weakness and headaches.  Psychiatric/Behavioral: Positive for substance abuse. Negative for depression.   MEDICATIONS AT HOME:   Prior to Admission medications   Medication Sig Start Date End Date Taking? Authorizing Provider  amitriptyline (ELAVIL) 150 MG tablet Take 150 mg by mouth at bedtime.    [provider]  atorvastatin (LIPITOR) 20 MG tablet Take 20 mg by mouth at bedtime.     [provider]  furosemide (LASIX) 20 MG tablet Take 20 mg by mouth 2 (two) times a day.    [provider]  gabapentin (NEURONTIN) 600 MG tablet Take 1 tablet (600 mg total) by mouth 3 (three) times daily. 09/28/18   Stover, Titorya, DPM  glipiZIDE (GLUCOTROL) 10 MG tablet Take 1 tablet by mouth every morning. 06/05/18   [provider]  hydrOXYzine (ATARAX/VISTARIL) 50 MG tablet Take 50 mg by mouth 2 (two) times a day.  08/21/18   [provider]  insulin aspart (NOVOLOG) 100 UNIT/ML injection Inject 4-14 Units into the skin 3 (three) times daily as needed for high blood sugar.    [provider]  insulin glargine (LANTUS) 100 UNIT/ML injection Inject 40 Units into the skin 2 (two) times a day.    [provider]  Insulin Syringes, Disposable, U-100 0.3 ML MISC Inject 24 Units into the skin at bedtime. 05/14/14   Love, Ivan Anchors, PA-C  meloxicam (MOBIC) 7.5 MG tablet Take 7.5 mg by mouth daily as needed for pain.  08/21/18   [provider]  metFORMIN (GLUCOPHAGE) 1000 MG tablet Take 1,000 mg by mouth 2 (two) times daily with a meal.    [provider]  metoprolol tartrate (LOPRESSOR) 50 MG tablet Take 25 mg by mouth 2 (two) times daily.     [provider]  mirtazapine (REMERON) 15 MG tablet Take 7.5-15 mg by mouth at bedtime. 03/13/19    [provider]  pantoprazole (PROTONIX) 20 MG tablet Take 20 mg by mouth daily.  08/21/18   [provider]  prazosin (MINIPRESS) 5 MG capsule Take 5 mg by mouth at bedtime.    [provider]  promethazine (PHENERGAN) 12.5 MG tablet Take 12.5 mg by mouth 2 (two) times daily as needed for nausea or vomiting.  08/21/18   [provider]  QUEtiapine (SEROQUEL) 400 MG tablet Take 800 mg by mouth at bedtime.    [provider]  SIVEXTRO 200 MG TABS Take 1 tablet by mouth daily. Patient not taking: Reported on 04/23/2019 02/28/19   Powers, Evern Core, MD  venlafaxine XR (EFFEXOR-XR) 150 MG 24 hr capsule Take 300 mg by mouth daily.  09/14/18   [provider]      VITAL SIGNS:  There were no vitals taken for this visit.  PHYSICAL EXAMINATION:   Physical Exam  GENERAL:  43 y.o.-year-old patient lying in the bed with  no acute distress.  EYES: Pupils equal, round, reactive to light and accommodation. No scleral icterus. Extraocular muscles intact.  HEENT: Head atraumatic, normocephalic. Oropharynx and nasopharynx clear.  NECK:  Supple, no jugular venous distention. No thyroid enlargement, no tenderness.  LUNGS: Normal breath sounds bilaterally, no wheezing, rales,rhonchi or crepitation. No use of accessory muscles of respiration.  CARDIOVASCULAR: S1, S2 normal. No murmurs, rubs, or gallops.  ABDOMEN: Soft, nontender, nondistended. Bowel sounds present. No organomegaly or mass.  EXTREMITIES: No pedal edema, cyanosis, or clubbing.  NEUROLOGIC: Cranial nerves II through XII are intact. Muscle strength 5/5 in all extremities. Sensation intact. Gait not checked.  PSYCHIATRIC: The patient is alert and oriented x 3.  SKIN:      DATA REVIEWED:  LABORATORY PANEL:   CBC No results for input(s): WBC, HGB, HCT, PLT in the last 168 hours.  ------------------------------------------------------------------------------------------------------------------  Chemistries  No results for input(s): NA, K, CL, CO2, GLUCOSE, BUN, CREATININE, CALCIUM, MG, AST, ALT, ALKPHOS, BILITOT in the last 168 hours.  Invalid input(s): GFRCGP ------------------------------------------------------------------------------------------------------------------  Cardiac Enzymes No results for input(s): TROPONINI in the last 168 hours. ------------------------------------------------------------------------------------------------------------------  RADIOLOGY:  No results found.  EKG:  EKG: there are no recent tracings.  IMPRESSION AND PLAN:   43 y.o. female with pertinent past medical history of COPD, DM, hypertension, cocaine abuse, Heroine overdose, seizure disorder, recent staph aureus bacteremia, and left great toe infection s/p amputation, and mitral valve endocarditis treated with 6 weeks of antibiotics senting as direct admit from podiatry clinic with left fifth toe infection and necrosis  1. Left fifth toe osteomyelitis -patient with history of diabetes and recent left great toe osteomyelitis s/p amputation, still furious bacteremia and mitral valve endocarditis. - Admit to MedSurg unit - Cultures pending - Start empiric antibiotics with cefepime and vancomycin - Keep n.p.o. in a.m. - Podiatry consult, for possible amputation in a.m. with Dr. Luana Shu  2. Diabetes mellitus with neuropathy -*Hold glipizide and Metformin -Continue Lantus half dose as patient n.p.o. for possible surgery tomorrow  3. Acute on Chronic CKD -creatinine slightly elevated above baseline -Hold nephrotoxins -Continue to monitor renal function  4. Hyperlipidemia - Continue atorvastatin 20 mg  5. Hypertension - Continue metoprolol  6. GERD -Protonix  7. Depression and anxiety - Continue Remeron, amitriptyline, Effexor and quetiapine  8. COPD -no evidence of  exacerbation  9. DVT prophylaxis -hold anticoagulation for surgery in the a.m.  All the records are reviewed and case discussed with ED provider. Management plans discussed with the patient, family and they are in agreement.  CODE STATUS: FULL  TOTAL TIME TAKING CARE OF THIS PATIENT: 50 minutes.    on 08/15/2019 at 7:41 PM   Rufina Falco, DNP, FNP-BC [facility] Nurse Practitioner Between 7am to 6pm - Pager [phone]  After 6pm go to [email or web address] - password [email or web address]  Attestation: I have seen and examined the patient personally and has reviewed patient's chart, labs and agree with Rufina Falco, DNP history and physical, treatment plan of care   [facility]  Office  [phone]  CC: Primary care physician; Lacie Draft, NP

## 2019-08-15 NOTE — Progress Notes (Signed)
Pharmacy Antibiotic Note  [name] is a 43 y.o. female admitted on 08/15/2019 with osteomyelitis.  Pharmacy has been consulted for Vancomycin, Cefepime dosing.  Plan: Cefepime 2 gm IV X 1 give in ED on 10/22 @ 2113. Cefepime 2 gm IV Q12H ordered to continue on 10/23 @ 0900.  Vancomycin 2 gm IV X 1 ordered for 10/22 @ 2200 Vancomycin 1250 mg IV Q48H ordered to start on 10/24 @ 2200. No peak or trough currently ordered.   Vd = 45.7 L  Ke = 0.027 hr-1 T1/2 = 26 hrs Vanc trough = 11 mcg/mL AUC = 513.6   Height: 5' 10.8" (179.8 cm) Weight: 201 lb 4.5 oz (91.3 kg) IBW/kg (Calculated) : 70.34  No data recorded.  Recent Labs  Lab 08/15/19 2042  CREATININE 2.02*    Estimated Creatinine Clearance: 44.2 mL/min (A) (by C-G formula based on SCr of 2.02 mg/dL (H)).    Allergies  Allergen Reactions  . Albumin (Human)     unknown  . Erythromycin Rash  . Midecamycin Rash    Antimicrobials this admission:   >>    >>   Dose adjustments this admission:   Microbiology results:  BCx:   UCx:    Sputum:    MRSA PCR:   Thank you for allowing pharmacy to be a part of this patient's care.  Jaquarius Seder D 08/15/2019 10:04 PM

## 2019-08-15 NOTE — Consult Note (Addendum)
ORTHOPAEDIC CONSULTATION  REQUESTING PHYSICIAN: Saundra Shelling, MD  Chief Complaint: Osteomyelitis left fifth toe  HPI: [name] is a 43 y.o. female who complains of infection left fifth toe.  Seen in the outpatient clinic with worsening swelling redness to her left fifth toe.  She is status post left great toe amputation for osteomyelitis.  She has undergone revascularization in the past as well.  She noticed over the last 3 days worsening redness swelling to the left fifth toe.  She is also had some malaise and fatigue.  Past Medical History:  Diagnosis Date  . Chronic combined systolic and diastolic CHF (congestive heart failure) ([place])    a. ECHO 04/23/14 LV EF: 45% - 50%,  mild LVH, diffuse hypokinesis. LV diastolic dysfunction, high ventricular filling pressure. Mild LA dilation.  . Cocaine abuse ([place])   . Diabetes mellitus without complication ([place])   . Heroin overdose ([place]) 10/2018  . Hypertension   . Nicotine dependence with current use   . Seizures ([place])    Past Surgical History:  Procedure Laterality Date  . AMPUTATION TOE Left 03/23/2019   Procedure: AMPUTATION TOE GREAT LEFT TOE;  Surgeon: [name] Patricia, DPM;  Location: [facility];  Service: Podiatry;  Laterality: Left;  . CHOLECYSTECTOMY  2005  . LOWER EXTREMITY ANGIOGRAPHY Left 03/12/2019   Procedure: LOWER EXTREMITY ANGIOGRAPHY;  Surgeon: Katha Cabal, MD;  Location: [facility];  Service: Cardiovascular;  Laterality: Left;  . TEE WITHOUT CARDIOVERSION N/A 03/26/2019   Procedure: TRANSESOPHAGEAL ECHOCARDIOGRAM (TEE);  Surgeon: Nelva Bush, MD;  Location: [facility];  Service: Cardiovascular;  Laterality: N/A;  . TUBAL LIGATION     Social History   Socioeconomic History  . Marital status: Single    Spouse name: Not on file  . Number of children: 0  . Years of education: 78  . Highest education level: Not on file  Occupational History  . Occupation: Unemployed  Social Needs  . Financial  resource strain: Not on file  . Food insecurity    Worry: Not on file    Inability: Not on file  . Transportation needs    Medical: Not on file    Non-medical: Not on file  Tobacco Use  . Smoking status: Current Every Day Smoker    Packs/day: 1.00    Years: 28.00    Pack years: 28.00  . Smokeless tobacco: Never Used  Substance and Sexual Activity  . Alcohol use: Not Currently    Alcohol/week: 0.0 standard drinks    Comment: Quit drinking 11/16 per pt  . Drug use: Yes    Types: Cocaine, Heroin  . Sexual activity: Yes  Lifestyle  . Physical activity    Days per week: Not on file    Minutes per session: Not on file  . Stress: Not on file  Relationships  . Social Herbalist on phone: Not on file    Gets together: Not on file    Attends religious service: Not on file    Active member of club or organization: Not on file    Attends meetings of clubs or organizations: Not on file    Relationship status: Not on file  Other Topics Concern  . Not on file  Social History Narrative   Lives at home with mother.    Caffeine use: Soda (drinks 3L per day)   Family History  Problem Relation Age of Onset  . Hypertension Other   . Drug abuse Mother   .  Seizures Neg Hx    Allergies  Allergen Reactions  . Albumin (Human)     unknown  . Erythromycin Rash  . Midecamycin Rash   Prior to Admission medications   Medication Sig Start Date End Date Taking? Authorizing Provider  amitriptyline (ELAVIL) 150 MG tablet Take 150 mg by mouth at bedtime.    [provider]  atorvastatin (LIPITOR) 20 MG tablet Take 20 mg by mouth at bedtime.     [provider]  furosemide (LASIX) 20 MG tablet Take 20 mg by mouth 2 (two) times a day.    [provider]  gabapentin (NEURONTIN) 600 MG tablet Take 1 tablet (600 mg total) by mouth 3 (three) times daily. 09/28/18   Stover, Titorya, DPM  glipiZIDE (GLUCOTROL) 10 MG tablet Take 1 tablet by mouth every morning.  06/05/18   [provider]  hydrOXYzine (ATARAX/VISTARIL) 50 MG tablet Take 50 mg by mouth 2 (two) times a day.  08/21/18   [provider]  insulin aspart (NOVOLOG) 100 UNIT/ML injection Inject 4-14 Units into the skin 3 (three) times daily as needed for high blood sugar.    [provider]  insulin glargine (LANTUS) 100 UNIT/ML injection Inject 40 Units into the skin 2 (two) times a day.    [provider]  Insulin Syringes, Disposable, U-100 0.3 ML MISC Inject 24 Units into the skin at bedtime. 05/14/14   Love, Ivan Anchors, PA-C  meloxicam (MOBIC) 7.5 MG tablet Take 7.5 mg by mouth daily as needed for pain.  08/21/18   [provider]  metFORMIN (GLUCOPHAGE) 1000 MG tablet Take 1,000 mg by mouth 2 (two) times daily with a meal.    [provider]  metoprolol tartrate (LOPRESSOR) 50 MG tablet Take 25 mg by mouth 2 (two) times daily.     [provider]  mirtazapine (REMERON) 15 MG tablet Take 7.5-15 mg by mouth at bedtime. 03/13/19   [provider]  pantoprazole (PROTONIX) 20 MG tablet Take 20 mg by mouth daily.  08/21/18   [provider]  prazosin (MINIPRESS) 5 MG capsule Take 5 mg by mouth at bedtime.    [provider]  promethazine (PHENERGAN) 12.5 MG tablet Take 12.5 mg by mouth 2 (two) times daily as needed for nausea or vomiting.  08/21/18   [provider]  QUEtiapine (SEROQUEL) 400 MG tablet Take 800 mg by mouth at bedtime.    [provider]  SIVEXTRO 200 MG TABS Take 1 tablet by mouth daily. Patient not taking: Reported on 04/23/2019 02/28/19   Powers, Evern Core, MD  venlafaxine XR (EFFEXOR-XR) 150 MG 24 hr capsule Take 300 mg by mouth daily.  09/14/18   [provider]   No results found.  Positive ROS: All other systems have been reviewed and were otherwise negative with the exception of those mentioned in the HPI and as above.  12 point ROS was performed.  Physical  Exam: General: Alert and oriented.  No apparent distress.  Vascular:  Left foot:Dorsalis Pedis:  present Posterior Tibial:  present  Right foot: Dorsalis Pedis:  present Posterior Tibial:  present  Neuro:absent protective sensation  Derm: Ulceration probes medially down to the bone on the distal aspect of the left fifth toe.  There is erythema diffusely to the left fifth toe.  No lymphangitic streaking at this time.  No purulent drainage.        Ortho/MS: Edema is noted diffusely to the left fifth toe.  She status post left great toe amputation.  X-rays in the outpatient clinic showed osteomyelitis of the left fifth toe.  Assessment: Osteomyelitis left fifth toe Diabetes with neuropathy  Plan: Patient has osteomyelitis of the left fifth toe.  We discussed surgical intervention and removal of the fifth toe to get rid of all the infected bone.  She wishes to proceed with this at this time.  We will plan for surgery tomorrow afternoon.  She will be n.p.o. after breakfast tomorrow.  Medicine has been consulted for medical evaluation.  Dr. Luana Shu to perform surgery tomorrow.  I discussed the risk benefits alternatives and complications associated with surgery and consent has been given.    Elesa Hacker, DPM Cell [phone]   08/15/2019 6:06 PM

## 2019-08-16 ENCOUNTER — Encounter: Payer: Self-pay | Admitting: Anesthesiology

## 2019-08-16 ENCOUNTER — Inpatient Hospital Stay: Admission: RE | Admit: 2019-08-16 | Payer: Medicaid Other | Source: Home / Self Care | Admitting: Podiatry

## 2019-08-16 ENCOUNTER — Inpatient Hospital Stay: Payer: Medicaid Other | Admitting: Anesthesiology

## 2019-08-16 ENCOUNTER — Other Ambulatory Visit: Payer: Self-pay

## 2019-08-16 ENCOUNTER — Encounter
Admission: AD | Disposition: A | Discharge status: Home Health | Payer: Self-pay | Source: Ambulatory Visit | Attending: Internal Medicine

## 2019-08-16 ENCOUNTER — Inpatient Hospital Stay: Payer: Medicaid Other

## 2019-08-16 HISTORY — PX: AMPUTATION TOE: SHX6595

## 2019-08-16 LAB — CBC WITH DIFFERENTIAL/PLATELET
Abs Immature Granulocytes: 0.05 10*3/uL (ref 0.00–0.07)
Basophils Absolute: 0.1 10*3/uL (ref 0.0–0.1)
Basophils Relative: 1 %
Eosinophils Absolute: 0.3 10*3/uL (ref 0.0–0.5)
Eosinophils Relative: 3 %
HCT: 34.2 % — ABNORMAL LOW (ref 36.0–46.0)
Hemoglobin: 11.6 g/dL — ABNORMAL LOW (ref 12.0–15.0)
Immature Granulocytes: 1 %
Lymphocytes Relative: 18 %
Lymphs Abs: 1.9 10*3/uL (ref 0.7–4.0)
MCH: 30.3 pg (ref 26.0–34.0)
MCHC: 33.9 g/dL (ref 30.0–36.0)
MCV: 89.3 fL (ref 80.0–100.0)
Monocytes Absolute: 0.9 10*3/uL (ref 0.1–1.0)
Monocytes Relative: 8 %
Neutro Abs: 7.8 10*3/uL — ABNORMAL HIGH (ref 1.7–7.7)
Neutrophils Relative %: 69 %
Platelets: 332 10*3/uL (ref 150–400)
RBC: 3.83 MIL/uL — ABNORMAL LOW (ref 3.87–5.11)
RDW: 12.6 % (ref 11.5–15.5)
WBC: 11 10*3/uL — ABNORMAL HIGH (ref 4.0–10.5)
nRBC: 0 % (ref 0.0–0.2)

## 2019-08-16 LAB — BASIC METABOLIC PANEL
Anion gap: 12 (ref 5–15)
BUN: 38 mg/dL — ABNORMAL HIGH (ref 6–20)
CO2: 24 mmol/L (ref 22–32)
Calcium: 8.5 mg/dL — ABNORMAL LOW (ref 8.9–10.3)
Chloride: 101 mmol/L (ref 98–111)
Creatinine, Ser: 1.69 mg/dL — ABNORMAL HIGH (ref 0.44–1.00)
GFR calc Af Amer: 42 mL/min — ABNORMAL LOW (ref >60)
GFR calc non Af Amer: 36 mL/min — ABNORMAL LOW (ref >60)
Glucose, Bld: 210 mg/dL — ABNORMAL HIGH (ref 70–99)
Potassium: 3.2 mmol/L — ABNORMAL LOW (ref 3.5–5.1)
Sodium: 137 mmol/L (ref 135–145)

## 2019-08-16 LAB — GLUCOSE, CAPILLARY
Glucose-Capillary: 244 mg/dL — ABNORMAL HIGH (ref 70–99)
Glucose-Capillary: 175 mg/dL — ABNORMAL HIGH (ref 70–99)
Glucose-Capillary: 102 mg/dL — ABNORMAL HIGH (ref 70–99)
Glucose-Capillary: 104 mg/dL — ABNORMAL HIGH (ref 70–99)
Glucose-Capillary: 217 mg/dL — ABNORMAL HIGH (ref 70–99)

## 2019-08-16 LAB — SURGICAL PCR SCREEN
MRSA, PCR: NEGATIVE
Staphylococcus aureus: POSITIVE — AB

## 2019-08-16 LAB — SARS CORONAVIRUS 2 BY RT PCR (HOSPITAL ORDER, PERFORMED IN ~~LOC~~ HOSPITAL LAB): SARS Coronavirus 2: NEGATIVE

## 2019-08-16 SURGERY — AMPUTATION, TOE
Anesthesia: General | Site: Toe | Laterality: Left

## 2019-08-16 MED ORDER — LIDOCAINE HCL (CARDIAC) PF 100 MG/5ML IV SOSY
PREFILLED_SYRINGE | INTRAVENOUS | Status: DC | PRN
  Administered 2019-08-16: 100 mg via INTRAVENOUS

## 2019-08-16 MED ORDER — PROMETHAZINE HCL 25 MG PO TABS
12.5000 mg | ORAL_TABLET | Freq: Two times a day (BID) | ORAL | Status: DC | PRN
  Filled 2019-08-16: qty 1

## 2019-08-16 MED ORDER — MUPIROCIN 2 % EX OINT
1.0000 "application " | TOPICAL_OINTMENT | Freq: Two times a day (BID) | CUTANEOUS | Status: DC
  Administered 2019-08-16 – 2019-08-18 (×4): 1 via NASAL
  Filled 2019-08-16: qty 22

## 2019-08-16 MED ORDER — POTASSIUM CHLORIDE CRYS ER 20 MEQ PO TBCR
40.0000 meq | EXTENDED_RELEASE_TABLET | Freq: Once | ORAL | Status: AC
  Administered 2019-08-16: 40 meq via ORAL
  Filled 2019-08-16: qty 2

## 2019-08-16 MED ORDER — AMITRIPTYLINE HCL 25 MG PO TABS
150.0000 mg | ORAL_TABLET | Freq: Every day | ORAL | Status: DC
  Administered 2019-08-16 – 2019-08-17 (×2): 150 mg via ORAL
  Filled 2019-08-16 (×2): qty 6

## 2019-08-16 MED ORDER — PRAZOSIN HCL 5 MG PO CAPS
5.0000 mg | ORAL_CAPSULE | Freq: Every day | ORAL | Status: DC
  Administered 2019-08-16: 5 mg via ORAL
  Filled 2019-08-16 (×3): qty 1

## 2019-08-16 MED ORDER — VANCOMYCIN HCL 1.5 G IV SOLR
1500.0000 mg | INTRAVENOUS | Status: DC
  Administered 2019-08-16 – 2019-08-17 (×2): 1500 mg via INTRAVENOUS
  Filled 2019-08-16 (×3): qty 1500

## 2019-08-16 MED ORDER — MIRTAZAPINE 15 MG PO TABS
7.5000 mg | ORAL_TABLET | Freq: Every day | ORAL | Status: DC
  Administered 2019-08-16 – 2019-08-17 (×2): 7.5 mg via ORAL
  Filled 2019-08-16 (×2): qty 1

## 2019-08-16 MED ORDER — VENLAFAXINE HCL ER 150 MG PO CP24
300.0000 mg | ORAL_CAPSULE | Freq: Every day | ORAL | Status: DC
  Administered 2019-08-16 – 2019-08-18 (×3): 300 mg via ORAL
  Filled 2019-08-16 (×3): qty 2

## 2019-08-16 MED ORDER — MIDAZOLAM HCL 2 MG/2ML IJ SOLN
INTRAMUSCULAR | Status: DC | PRN
  Administered 2019-08-16: 2 mg via INTRAVENOUS

## 2019-08-16 MED ORDER — VANCOMYCIN HCL 10 G IV SOLR
1500.0000 mg | INTRAVENOUS | Status: DC
  Filled 2019-08-16: qty 1500

## 2019-08-16 MED ORDER — GABAPENTIN 300 MG PO CAPS
300.0000 mg | ORAL_CAPSULE | Freq: Three times a day (TID) | ORAL | Status: DC
  Administered 2019-08-16 – 2019-08-18 (×6): 300 mg via ORAL
  Filled 2019-08-16 (×6): qty 1

## 2019-08-16 MED ORDER — QUETIAPINE FUMARATE 300 MG PO TABS: 800.0000 mg | ORAL_TABLET | Freq: Every day | ORAL | Status: DC

## 2019-08-16 MED ORDER — INSULIN GLARGINE 100 UNIT/ML ~~LOC~~ SOLN
20.0000 [IU] | Freq: Every day | SUBCUTANEOUS | Status: DC
  Administered 2019-08-16: 20 [IU] via SUBCUTANEOUS
  Filled 2019-08-16 (×2): qty 0.2

## 2019-08-16 MED ORDER — EPHEDRINE SULFATE 50 MG/ML IJ SOLN
INTRAMUSCULAR | Status: DC | PRN
  Administered 2019-08-16 (×2): 10 mg via INTRAVENOUS

## 2019-08-16 MED ORDER — PHENYLEPHRINE HCL (PRESSORS) 10 MG/ML IV SOLN
INTRAVENOUS | Status: DC | PRN
  Administered 2019-08-16 (×4): 200 ug via INTRAVENOUS

## 2019-08-16 MED ORDER — PROMETHAZINE HCL 25 MG/ML IJ SOLN: 6.2500 mg | INTRAMUSCULAR | Status: DC | PRN

## 2019-08-16 MED ORDER — FENTANYL CITRATE (PF) 100 MCG/2ML IJ SOLN: 25.0000 ug | INTRAMUSCULAR | Status: DC | PRN

## 2019-08-16 MED ORDER — HYDROXYZINE HCL 50 MG PO TABS
50.0000 mg | ORAL_TABLET | Freq: Two times a day (BID) | ORAL | Status: DC
  Administered 2019-08-16 – 2019-08-18 (×5): 50 mg via ORAL
  Filled 2019-08-16 (×6): qty 1

## 2019-08-16 MED ORDER — BUPIVACAINE HCL 0.5 % IJ SOLN
INTRAMUSCULAR | Status: DC | PRN
  Administered 2019-08-16: 20 mL

## 2019-08-16 MED ORDER — NEOMYCIN-POLYMYXIN B GU 40-200000 IR SOLN
Status: DC | PRN
  Administered 2019-08-16: 2 mL

## 2019-08-16 MED ORDER — METOPROLOL TARTRATE 25 MG PO TABS
25.0000 mg | ORAL_TABLET | Freq: Two times a day (BID) | ORAL | Status: DC
  Administered 2019-08-16 – 2019-08-18 (×2): 25 mg via ORAL
  Filled 2019-08-16 (×3): qty 1

## 2019-08-16 MED ORDER — ONDANSETRON HCL 4 MG/2ML IJ SOLN
INTRAMUSCULAR | Status: DC | PRN
  Administered 2019-08-16: 4 mg via INTRAVENOUS

## 2019-08-16 MED ORDER — FENTANYL CITRATE (PF) 100 MCG/2ML IJ SOLN
INTRAMUSCULAR | Status: DC | PRN
  Administered 2019-08-16 (×2): 25 ug via INTRAVENOUS

## 2019-08-16 MED ORDER — PROPOFOL 10 MG/ML IV BOLUS
INTRAVENOUS | Status: DC | PRN
  Administered 2019-08-16: 200 mg via INTRAVENOUS

## 2019-08-16 MED ORDER — QUETIAPINE FUMARATE 200 MG PO TABS
400.0000 mg | ORAL_TABLET | Freq: Every day | ORAL | Status: DC
  Administered 2019-08-16 – 2019-08-17 (×2): 400 mg via ORAL
  Filled 2019-08-16 (×3): qty 2

## 2019-08-16 MED ORDER — ENSURE PRE-SURGERY PO LIQD
296.0000 mL | Freq: Once | ORAL | Status: DC
  Filled 2019-08-16: qty 296

## 2019-08-16 MED ORDER — PANTOPRAZOLE SODIUM 20 MG PO TBEC
20.0000 mg | DELAYED_RELEASE_TABLET | Freq: Every day | ORAL | Status: DC
  Administered 2019-08-16 – 2019-08-18 (×3): 20 mg via ORAL
  Filled 2019-08-16 (×3): qty 1

## 2019-08-16 MED ORDER — GABAPENTIN 600 MG PO TABS
600.0000 mg | ORAL_TABLET | Freq: Three times a day (TID) | ORAL | Status: DC
  Filled 2019-08-16: qty 1

## 2019-08-16 SURGICAL SUPPLY — 50 items
BLADE MED AGGRESSIVE (BLADE) ×1 IMPLANT
BLADE OSC/SAGITTAL 5.5X25 (BLADE) ×1 IMPLANT
BLADE OSC/SAGITTAL MD 5.5X18 (BLADE) IMPLANT
BLADE SURG 15 STRL LF DISP TIS (BLADE) IMPLANT
BLADE SURG 15 STRL SS (BLADE)
BLADE SURG MINI STRL (BLADE) IMPLANT
BNDG CONFORM 2 STRL LF (GAUZE/BANDAGES/DRESSINGS) IMPLANT
BNDG ELASTIC 4X5.8 VLCR STR LF (GAUZE/BANDAGES/DRESSINGS) ×2 IMPLANT
BNDG ESMARK 4X12 TAN STRL LF (GAUZE/BANDAGES/DRESSINGS) ×2 IMPLANT
BNDG GAUZE 4.5X4.1 6PLY STRL (MISCELLANEOUS) ×2 IMPLANT
CANISTER SUCT 1200ML W/VALVE (MISCELLANEOUS) ×2 IMPLANT
CNTNR SPEC 2.5X3XGRAD LEK (MISCELLANEOUS) ×1
CONT SPEC 4OZ STER OR WHT (MISCELLANEOUS) ×1
CONT SPEC 4OZ STRL OR WHT (MISCELLANEOUS) ×1
CONTAINER SPEC 2.5X3XGRAD LEK (MISCELLANEOUS) ×1 IMPLANT
COVER WAND RF STERILE (DRAPES) ×2 IMPLANT
CUFF TOURN 18 STER (MISCELLANEOUS) IMPLANT
CUFF TOURN DUAL PL 12 NO SLV (MISCELLANEOUS) IMPLANT
DRAPE FLUOR MINI C-ARM 54X84 (DRAPES) IMPLANT
DURAPREP 26ML APPLICATOR (WOUND CARE) ×2 IMPLANT
ELECT REM PT RETURN 9FT ADLT (ELECTROSURGICAL) ×2
ELECTRODE REM PT RTRN 9FT ADLT (ELECTROSURGICAL) ×1 IMPLANT
GAUZE SPONGE 4X4 12PLY STRL (GAUZE/BANDAGES/DRESSINGS) ×4 IMPLANT
GAUZE XEROFORM 1X8 LF (GAUZE/BANDAGES/DRESSINGS) ×2 IMPLANT
GLOVE BIO SURGEON STRL SZ7 (GLOVE) ×2 IMPLANT
GLOVE INDICATOR 7.0 STRL GRN (GLOVE) ×2 IMPLANT
GOWN STRL REUS W/ TWL LRG LVL3 (GOWN DISPOSABLE) ×2 IMPLANT
GOWN STRL REUS W/TWL LRG LVL3 (GOWN DISPOSABLE) ×4
HANDPIECE VERSAJET DEBRIDEMENT (MISCELLANEOUS) IMPLANT
KIT TURNOVER KIT A (KITS) ×2 IMPLANT
LABEL OR SOLS (LABEL) ×2 IMPLANT
NDL FILTER BLUNT 18X1 1/2 (NEEDLE) ×1 IMPLANT
NDL HYPO 25X1 1.5 SAFETY (NEEDLE) ×2 IMPLANT
NEEDLE FILTER BLUNT 18X 1/2SAF (NEEDLE) ×1
NEEDLE FILTER BLUNT 18X1 1/2 (NEEDLE) ×1 IMPLANT
NEEDLE HYPO 25X1 1.5 SAFETY (NEEDLE) ×4 IMPLANT
NS IRRIG 500ML POUR BTL (IV SOLUTION) ×2 IMPLANT
PACK EXTREMITY ARMC (MISCELLANEOUS) ×2 IMPLANT
SOL .9 NS 3000ML IRR  AL (IV SOLUTION)
SOL .9 NS 3000ML IRR AL (IV SOLUTION)
SOL .9 NS 3000ML IRR UROMATIC (IV SOLUTION) IMPLANT
SOL PREP PVP 2OZ (MISCELLANEOUS)
SOLUTION PREP PVP 2OZ (MISCELLANEOUS) ×1 IMPLANT
STOCKINETTE STRL 6IN 960660 (GAUZE/BANDAGES/DRESSINGS) ×2 IMPLANT
SUT ETHILON 3-0 FS-10 30 BLK (SUTURE) ×4
SUT VIC AB 3-0 SH 27 (SUTURE) ×2
SUT VIC AB 3-0 SH 27X BRD (SUTURE) ×1 IMPLANT
SUTURE EHLN 3-0 FS-10 30 BLK (SUTURE) ×2 IMPLANT
SWAB CULTURE AMIES ANAERIB BLU (MISCELLANEOUS) IMPLANT
SYR 10ML LL (SYRINGE) ×2 IMPLANT

## 2019-08-16 NOTE — Op Note (Signed)
PODIATRY / FOOT AND ANKLE SURGERY OPERATIVE REPORT    SURGEON: Caroline More, DPM  PRE-OPERATIVE DIAGNOSIS:  1.  Left fifth toe osteomyelitis secondary to diabetic foot ulceration with bone exposed at the distal tip of the fifth toe 2.  Left foot cellulitis 3.  Diabetes type 2 with polyneuropathy  POST-OPERATIVE DIAGNOSIS: Same  PROCEDURE(S): 1. Left fifth toe amputation  HEMOSTASIS: Left ankle tourniquet, 20 minutes  ANESTHESIA: MAC  ESTIMATED BLOOD LOSS: 5 cc   FINDING(S): 1.  Osteomyelitis distal aspect left fifth toe with corresponding cellulitis streaking up the left forefoot and midfoot.  PATHOLOGY/SPECIMEN(S): Left fifth toe culture and pathologic specimen.  INDICATIONS:   [name] is a 43 y.o. female who presents with a nonhealing ulceration to the distal aspect of the left fifth toe.  Patient was previously seen by Dr. Vickki Muff in clinic and was admitted to the hospital for further work-up due to x-ray imaging showing osteomyelitis as well as clinical examination revealing ulceration present to the distal aspect of the left fifth toe which probes to bone.  Patient was worked up in the hospital now presents today for surgical intervention consisting of left fifth toe amputation..  DESCRIPTION: After obtaining full informed written consent, the patient was brought back to the operating room and placed supine upon the operating table.  The patient received IV antibiotics prior to induction.  After obtaining adequate anesthesia, a left fifth ray block was performed utilizing a total of 10 cc of half percent Marcaine plain.  The patient was prepped and draped in the standard fashion.  An Esmarch bandage was used to exsanguinate the patient's left lower extremity and the pneumatic ankle tourniquet was inflated.  Attention was then directed to the distal aspect of the left fifth toe where the toe appeared to probe to bone and purulence was able to be expressed and an opening  bone culture was taken and sent off.  The toe appeared to be grossly swollen and inflamed with erythema extending to the midfoot area.  Attention was then directed to the left fifth toe again where a racquet type incision was made around the toe at the base level in the racquet extended the dorsal medial aspect of the fifth metatarsal head area.  The incision was made straight to bone at this area after it was marked out.  The fifth MPJ was then identified and extensor tenotomy and capsulotomy was performed followed by release the collateral and suspensory ligaments as well as the plantar plate and any attachments to the flexor tendon.  The tendons were cut as far proximally as possible.  The fifth digit was then disarticulated and passed off the operative site.  The fifth digit proximal margin was marked at the base the proximal phalanx with purple ink and passed off for pathologic specimen.  The fifth metatarsal head appeared to be healthy and viable with no signs or evidence of osteomyelitis.  The surgical site was flushed with copious amounts normal sterile saline.  The incision was then reapproximated well coapted starting with subcutaneous tissue utilizing 3-0 Vicryl and the skin was then reapproximated well coapted with 3-0 nylon in a combination of simple and horizontal mattress type stitches.  An additional 10 cc of half percent Marcaine plain was injected about the operative site.  A postoperative dressing was then applied consisting Xeroform, 4 x 4 gauze, Kerlix, Ace wrap.  The pneumatic ankle tourniquet was deflated and a prompt hyperemic response was noted all digits left foot.  The patient tolerated the procedure and anesthesia well was transferred to recovery room vital signs stable vascular status intact all toes left foot.  COMPLICATIONS: None  CONDITION: Good, stable  Caroline More

## 2019-08-16 NOTE — Anesthesia Post-op Follow-up Note (Signed)
Anesthesia QCDR form completed.        

## 2019-08-16 NOTE — Progress Notes (Signed)
Pharmacy Antibiotic Note  [name] is a 43 y.o. female admitted on 08/15/2019 with osteomyelitis.  Pharmacy has been consulted for Vancomycin, Cefepime dosing.  Plan: 10/23 @ Scr 1.69 will readjust vanc as follows: Vancomycin 1500 mg IV Q 24 hrs. Goal AUC 400-550. Expected AUC: 524.1 SCr used: 1.69 Cssmin: 13.2  With first dose of maintenance to start 10/23 @ 2200 tonight. Will continue to monitor.  Height: 5' 10.8" (179.8 cm) Weight: 201 lb 4.5 oz (91.3 kg) IBW/kg (Calculated) : 70.34  Temp (24hrs), Avg:98.1 F (36.7 C), Min:98.1 F (36.7 C), Max:98.1 F (36.7 C)  Recent Labs  Lab 08/15/19 2042 08/16/19 0419  WBC  --  11.0*  CREATININE 2.02* 1.69*    Estimated Creatinine Clearance: 52.8 mL/min (A) (by C-G formula based on SCr of 1.69 mg/dL (H)).    Allergies  Allergen Reactions  . Albumin (Human)     unknown  . Erythromycin Rash  . Midecamycin Rash    Thank you for allowing pharmacy to be a part of this patient's care.  Tobie Lords, PharmD, BCPS Clinical Pharmacist 08/16/2019 6:25 AM

## 2019-08-16 NOTE — Transfer of Care (Signed)
Immediate Anesthesia Transfer of Care Note  Patient: Yerlin A Ormond  Procedure(s) Performed: AMPUTATION TOE 5th (Left Toe)  Patient Location: PACU  Anesthesia Type:General  Level of Consciousness: awake, drowsy and patient cooperative  Airway & Oxygen Therapy: Patient Spontanous Breathing and Patient connected to face mask oxygen  Post-op Assessment: Report given to RN and Post -op Vital signs reviewed and stable  Post vital signs: Reviewed and stable  Last Vitals:  Vitals Value Taken Time  BP 116/77 08/16/19 1802  Temp    Pulse    Resp 14 08/16/19 1803  SpO2    Vitals shown include unvalidated device data.  Last Pain:  Vitals:   08/16/19 1600  TempSrc: Oral  PainSc:       Patients Stated Pain Goal: 0 ([id number])  Complications: No apparent anesthesia complications

## 2019-08-16 NOTE — Progress Notes (Signed)
Pt bed alarm going off, walked into the room, pt stated want to go to the bathroom, assisted to bathroom with walker and all of a sudden pt stated " I think I am going to fall". I held pt up with both hands and called for help. After a few minutes before help could come and pt becoming too heavy for staff to continue to held her I lowered her to a seating position on floor to prevent her from falling and also prevent injuring staff. Pt was later assisted up with another staff member and pt finished ambulating to bathroom without any complain and denies any pain. Pt ambulated back to bed with walker and staff back to bed.

## 2019-08-16 NOTE — H&P (Signed)
PODIATRY H&P  Reason for consult: L 5th toe infection  Chief Complaint: L 5th toe infection, sore   HPI: [name] is a 43 y.o. female who presents with an infection to the left fifth toe.  Patient was seen yesterday in the [facility] clinic by Dr. Vickki Muff and was subsequently admitted to the hospital due to osteomyelitic changes and draining open ulceration.  She has previously undergone revascularization in the past.  X-rays that were taken in clinic showed osteomyelitic changes to the left fifth toe.  Patient presents today resting in bed comfortably and awaiting surgical procedure consisting of left fifth toe amputation.  Patient has a previous history of left hallux amputation.  PMHx:  Past Medical History:  Diagnosis Date  . Chronic combined systolic and diastolic CHF (congestive heart failure) ([place])    a. ECHO 04/23/14 LV EF: 45% - 50%,  mild LVH, diffuse hypokinesis. LV diastolic dysfunction, high ventricular filling pressure. Mild LA dilation.  . Cocaine abuse ([place])   . COPD (chronic obstructive pulmonary disease) ([place])   . Diabetes mellitus without complication ([place])   . Heroin overdose ([place]) 10/2018  . Hypertension   . Nicotine dependence with current use   . Seizures ([place])     Surgical Hx:  Past Surgical History:  Procedure Laterality Date  . AMPUTATION TOE Left 03/23/2019   Procedure: AMPUTATION TOE GREAT LEFT TOE;  Surgeon: [name] Patricia, DPM;  Location: [facility] ORS;  Service: Podiatry;  Laterality: Left;  . CHOLECYSTECTOMY  2005  . LOWER EXTREMITY ANGIOGRAPHY Left 03/12/2019   Procedure: LOWER EXTREMITY ANGIOGRAPHY;  Surgeon: Katha Cabal, MD;  Location: [facility];  Service: Cardiovascular;  Laterality: Left;  . TEE WITHOUT CARDIOVERSION N/A 03/26/2019   Procedure: TRANSESOPHAGEAL ECHOCARDIOGRAM (TEE);  Surgeon: Nelva Bush, MD;  Location: [facility] ORS;  Service: Cardiovascular;  Laterality: N/A;  . TUBAL LIGATION      FHx:  Family History  Problem  Relation Age of Onset  . Hypertension Other   . Drug abuse Mother   . Seizures Neg Hx     Social History:  reports that she has been smoking cigarettes. She has a 28.00 pack-year smoking history. She has never used smokeless tobacco. She reports previous alcohol use. She reports previous drug use.  Allergies:  Allergies  Allergen Reactions  . Albumin (Human)     unknown  . Erythromycin Rash  . Midecamycin Rash    Review of Systems: General ROS: negative Psychological ROS: negative Hematological and Lymphatic ROS: negative Respiratory ROS: no cough, shortness of breath, or wheezing Cardiovascular ROS: no chest pain or dyspnea on exertion Gastrointestinal ROS: no abdominal pain, change in bowel habits, or black or bloody stools Musculoskeletal ROS: positive for - joint pain, joint stiffness and joint swelling Neurological ROS: positive for - numbness/tingling Dermatological ROS: positive for Left fifth toe wound  Medications Prior to Admission  Medication Sig Dispense Refill  . amitriptyline (ELAVIL) 150 MG tablet Take 150 mg by mouth at bedtime.    Marland Kitchen aspirin EC 81 MG tablet Take 81 mg by mouth daily.    Marland Kitchen atorvastatin (LIPITOR) 20 MG tablet Take 20 mg by mouth at bedtime.     . bethanechol (URECHOLINE) 25 MG tablet Take 50 mg by mouth 3 (three) times daily.    . furosemide (LASIX) 20 MG tablet Take 20 mg by mouth 2 (two) times a day.    Marland Kitchen glipiZIDE (GLUCOTROL) 10 MG tablet Take 1 tablet by mouth every morning.    Marland Kitchen  hydrOXYzine (ATARAX/VISTARIL) 50 MG tablet Take 50 mg by mouth 2 (two) times a day.   0  . insulin aspart (NOVOLOG) 100 UNIT/ML injection Inject 4-14 Units into the skin 3 (three) times daily as needed for high blood sugar.    . insulin glargine (LANTUS) 100 UNIT/ML injection Inject 40 Units into the skin 2 (two) times a day.    . metFORMIN (GLUCOPHAGE) 1000 MG tablet Take 1,000 mg by mouth 2 (two) times daily with a meal.    . metoprolol tartrate (LOPRESSOR) 50 MG  tablet Take 25 mg by mouth at bedtime.     . mirtazapine (REMERON) 15 MG tablet Take 7.5-15 mg by mouth at bedtime.    . pantoprazole (PROTONIX) 20 MG tablet Take 20 mg by mouth daily.     . prazosin (MINIPRESS) 5 MG capsule Take 5 mg by mouth at bedtime.    . promethazine (PHENERGAN) 12.5 MG tablet Take 12.5 mg by mouth 2 (two) times daily as needed for nausea or vomiting.   3  . QUEtiapine (SEROQUEL) 400 MG tablet Take 800 mg by mouth at bedtime.    . tamsulosin (FLOMAX) 0.4 MG CAPS capsule TAKE 1 CAPSULE PO BID 30 MINUTES AFTER A MEAL    . venlafaxine XR (EFFEXOR-XR) 150 MG 24 hr capsule Take 300 mg by mouth daily.   3    Physical Exam: General: Alert and oriented.  No apparent distress. Vascular: DP/PT pulses palpable bilateral.  CFT intact to digits bilateral.  Erythema and edema to the left fifth toe. Neuro: Light touch sensation diminished to digits bilateral. Derm: Open ulceration to the left fifth toe distally that probes to bone.  Moderate erythema and edema associated with the ulceration with mild purulent drainage present.  Mild odor present. MSK: Mild pain on palpation left fifth toe.  Left hallux amputation.  Results for orders placed or performed during the hospital encounter of 08/15/19 (from the past 48 hour(s))  Anaerobic culture     Status: None (Preliminary result)   Collection Time: 08/15/19  6:02 PM   Specimen: Nasopharyngeal Swab  Result Value Ref Range   Specimen Description WOUND TOE    Special Requests      LEFT FIFTH Performed at [facility], [address] [place], [place] [address]    Gram Stain PENDING    Culture PENDING    Report Status PENDING   Aerobic Culture (superficial specimen)     Status: None (Preliminary result)   Collection Time: 08/15/19  6:02 PM   Specimen: Wound  Result Value Ref Range   Specimen Description      WOUND TOE Performed at [facility], [place] [address] [place], [place] [address]    Special Requests       LEFT FIFTH Performed at [facility], [place], [place] [address]    Gram Stain NO WBC SEEN NO ORGANISMS SEEN     Culture      TOO YOUNG TO READ Performed at [facility], [place] [address] [place], [place] [address]    Report Status PENDING   Urinalysis, Complete w Microscopic     Status: Abnormal   Collection Time: 08/15/19  7:31 PM  Result Value Ref Range   Color, Urine YELLOW (A) YELLOW   APPearance CLEAR (A) CLEAR   Specific Gravity, Urine 1.015 1.005 - 1.030   pH 5.0 5.0 - 8.0   Glucose, UA >=500 (A) NEGATIVE mg/dL  Hgb urine dipstick NEGATIVE NEGATIVE   Bilirubin Urine NEGATIVE NEGATIVE   Ketones, ur NEGATIVE NEGATIVE mg/dL   Protein, ur NEGATIVE NEGATIVE mg/dL   Nitrite NEGATIVE NEGATIVE   Leukocytes,Ua TRACE (A) NEGATIVE   RBC / HPF 0-5 0 - 5 RBC/hpf   WBC, UA 6-10 0 - 5 WBC/hpf   Bacteria, UA RARE (A) NONE SEEN   Squamous Epithelial / LPF 0-5 0 - 5   Mucus PRESENT    Hyaline Casts, UA PRESENT     Comment: Performed at [facility], [address] [place], [place] [address]  Urine Drug Screen, Qualitative ([facility] only)     Status: Abnormal   Collection Time: 08/15/19  7:31 PM  Result Value Ref Range   Tricyclic, Ur Screen POSITIVE (A) NONE DETECTED   Amphetamines, Ur Screen NONE DETECTED NONE DETECTED   MDMA (Ecstasy)Ur Screen NONE DETECTED NONE DETECTED   Cocaine Metabolite,Ur [place] NONE DETECTED NONE DETECTED   Opiate, Ur Screen NONE DETECTED NONE DETECTED   Phencyclidine (PCP) Ur S NONE DETECTED NONE DETECTED   Cannabinoid 50 Ng, Ur [place] NONE DETECTED NONE DETECTED   Barbiturates, Ur Screen NONE DETECTED NONE DETECTED   Benzodiazepine, Ur Scrn NONE DETECTED NONE DETECTED   Methadone Scn, Ur NONE DETECTED NONE DETECTED    Comment: (NOTE) Tricyclics + metabolites, urine    Cutoff 1000 ng/mL Amphetamines + metabolites, urine  Cutoff 1000 ng/mL MDMA (Ecstasy), urine              Cutoff 500 ng/mL Cocaine Metabolite, urine           Cutoff 300 ng/mL Opiate + metabolites, urine        Cutoff 300 ng/mL Phencyclidine (PCP), urine         Cutoff 25 ng/mL Cannabinoid, urine                 Cutoff 50 ng/mL Barbiturates + metabolites, urine  Cutoff 200 ng/mL Benzodiazepine, urine              Cutoff 200 ng/mL Methadone, urine                   Cutoff 300 ng/mL The urine drug screen provides only a preliminary, unconfirmed analytical test result and should not be used for non-medical purposes. Clinical consideration and professional judgment should be applied to any positive drug screen result due to possible interfering substances. A more specific alternate chemical method must be used in order to obtain a confirmed analytical result. Gas chromatography / mass spectrometry (GC/MS) is the preferred confirmat ory method. Performed at [facility], [address] [place], [place] [address]   CULTURE, BLOOD (ROUTINE X 2) w Reflex to ID Panel     Status: None (Preliminary result)   Collection Time: 08/15/19  7:51 PM   Specimen: BLOOD  Result Value Ref Range   Specimen Description BLOOD BLOOD LEFT FOREARM    Special Requests      BOTTLES DRAWN AEROBIC AND ANAEROBIC Blood Culture results may not be optimal due to an inadequate volume of blood received in culture bottles   Culture      NO GROWTH < 12 HOURS Performed at [facility], [address] [place], [place] [address]    Report Status PENDING   CULTURE, BLOOD (ROUTINE X 2) w Reflex to ID Panel     Status: None (Preliminary result)   Collection Time: 08/15/19  7:51 PM   Specimen: BLOOD  Result Value  Ref Range   Specimen Description BLOOD RIGHT ANTECUBITAL    Special Requests      BOTTLES DRAWN AEROBIC ONLY Blood Culture results may not be optimal due to an inadequate volume of blood received in culture bottles   Culture      NO GROWTH < 12 HOURS Performed at [facility], [address] [place], [place] [address]    Report  Status PENDING   SAR CoV2 Serology (COVID 19)AB(IGG)IA     Status: None   Collection Time: 08/15/19  8:42 PM  Result Value Ref Range   SARS-CoV-2 Ab, IgG NON REACTIVE NON REACTIVE    Comment: (NOTE) Non-Reactive for SARS-CoV-2 IgG Antibodies.  SARS-CoV-2 IgG antibodies not detected.  Negative results do not preclude acute SARS-CoV-2 infection.  Negative results may occur in samples collected too soon following infection or in immunosuppressed patients.  Serologic results should not be used to diagnose or exclude active/recent SARS-CoV-2 infection.  If acute infection is suspected, direct testing for SARS-CoV-2 is necessary.   The expected result is Non-Reactive.  Fact Sheet for Recipients:  LimitBuy.nl  Fact Sheet for Healthcare Providers:  WordAgents.no  Testing was performed using the Beckman Coulter SARS-CoV-2 IgG assay.  This test is not yet approved or cleared by the [place] and has been authorized by FDA under an Emergency Use Authorization (EUA).  This EUA will remain in effect (meaning this test can be used) for the duration of the COVID-19 declaration under Section 564(b)(1) of the  Act, 21 U.S.C. section 360bbb-3(b)(1), unless the authorization is terminated or revoked sooner. Performed at [facility], [address] [place], [place] [address]   Creatinine, serum     Status: Abnormal   Collection Time: 08/15/19  8:42 PM  Result Value Ref Range   Creatinine, Ser 2.02 (H) 0.44 - 1.00 mg/dL   GFR calc non Af Amer 29 (L) >60 mL/min   GFR calc Af Amer 34 (L) >60 mL/min    Comment: Performed at [facility], [address] [place],  [address]  Glucose, capillary     Status: Abnormal   Collection Time: 08/15/19  9:51 PM  Result Value Ref Range   Glucose-Capillary 305 (H) 70 - 99 mg/dL  CBC with Differential/Platelet     Status: Abnormal   Collection Time: 08/16/19  4:19 AM   Result Value Ref Range   WBC 11.0 (H) 4.0 - 10.5 K/uL   RBC 3.83 (L) 3.87 - 5.11 MIL/uL   Hemoglobin 11.6 (L) 12.0 - 15.0 g/dL   HCT 34.2 (L) 36.0 - 46.0 %   MCV 89.3 80.0 - 100.0 fL   MCH 30.3 26.0 - 34.0 pg   MCHC 33.9 30.0 - 36.0 g/dL   RDW 12.6 11.5 - 15.5 %   Platelets 332 150 - 400 K/uL   nRBC 0.0 0.0 - 0.2 %   Neutrophils Relative % 69 %   Neutro Abs 7.8 (H) 1.7 - 7.7 K/uL   Lymphocytes Relative 18 %   Lymphs Abs 1.9 0.7 - 4.0 K/uL   Monocytes Relative 8 %   Monocytes Absolute 0.9 0.1 - 1.0 K/uL   Eosinophils Relative 3 %   Eosinophils Absolute 0.3 0.0 - 0.5 K/uL   Basophils Relative 1 %   Basophils Absolute 0.1 0.0 - 0.1 K/uL   Immature Granulocytes 1 %   Abs Immature Granulocytes 0.05 0.00 - 0.07 K/uL    Comment: Performed at [facility], [place],  [place], [place] [id number]  Basic metabolic panel     Status: Abnormal   Collection Time: 08/16/19  4:19 AM  Result Value Ref Range   Sodium 137 135 - 145 mmol/L   Potassium 3.2 (L) 3.5 - 5.1 mmol/L   Chloride 101 98 - 111 mmol/L   CO2 24 22 - 32 mmol/L   Glucose, Bld 210 (H) 70 - 99 mg/dL   BUN 38 (H) 6 - 20 mg/dL   Creatinine, Ser 1.69 (H) 0.44 - 1.00 mg/dL   Calcium 8.5 (L) 8.9 - 10.3 mg/dL   GFR calc non Af Amer 36 (L) >60 mL/min   GFR calc Af Amer 42 (L) >60 mL/min   Anion gap 12 5 - 15    Comment: Performed at [facility], [address] [place], [place] [address]  Glucose, capillary     Status: Abnormal   Collection Time: 08/16/19  7:22 AM  Result Value Ref Range   Glucose-Capillary 244 (H) 70 - 99 mg/dL  Glucose, capillary     Status: Abnormal   Collection Time: 08/16/19 11:40 AM  Result Value Ref Range   Glucose-Capillary 175 (H) 70 - 99 mg/dL  Surgical PCR screen     Status: Abnormal   Collection Time: 08/16/19 12:04 PM   Specimen: Nasopharyngeal Swab; Nasal Swab  Result Value Ref Range   MRSA, PCR NEGATIVE NEGATIVE   Staphylococcus aureus POSITIVE (A) NEGATIVE     Comment: (NOTE) The Xpert SA Assay (FDA approved for NASAL specimens in patients 35 years of age and older), is one component of a comprehensive surveillance program. It is not intended to diagnose infection nor to guide or monitor treatment. Performed at [facility], [address] [place], [place] [address]   SARS Coronavirus 2 by RT PCR (hospital order, performed in [facility] hospital lab) Nasopharyngeal Nasopharyngeal Swab     Status: None   Collection Time: 08/16/19 12:04 PM   Specimen: Nasopharyngeal Swab  Result Value Ref Range   SARS Coronavirus 2 NEGATIVE NEGATIVE    Comment: (NOTE) If result is NEGATIVE SARS-CoV-2 target nucleic acids are NOT DETECTED. The SARS-CoV-2 RNA is generally detectable in upper and lower  respiratory specimens during the acute phase of infection. The lowest  concentration of SARS-CoV-2 viral copies this assay can detect is 250  copies / mL. A negative result does not preclude SARS-CoV-2 infection  and should not be used as the sole basis for treatment or other  patient management decisions.  A negative result may occur with  improper specimen collection / handling, submission of specimen other  than nasopharyngeal swab, presence of viral mutation(s) within the  areas targeted by this assay, and inadequate number of viral copies  (<250 copies / mL). A negative result must be combined with clinical  observations, patient history, and epidemiological information. If result is POSITIVE SARS-CoV-2 target nucleic acids are DETECTED. The SARS-CoV-2 RNA is generally detectable in upper and lower  respiratory specimens dur ing the acute phase of infection.  Positive  results are indicative of active infection with SARS-CoV-2.  Clinical  correlation with patient history and other diagnostic information is  necessary to determine patient infection status.  Positive results do  not rule out bacterial infection or co-infection with other  viruses. If result is PRESUMPTIVE POSTIVE SARS-CoV-2 nucleic acids MAY BE PRESENT.   A presumptive positive result was obtained on the submitted specimen  and confirmed on repeat testing.  While 2019 novel coronavirus  (SARS-CoV-2)  nucleic acids may be present in the submitted sample  additional confirmatory testing may be necessary for epidemiological  and / or clinical management purposes  to differentiate between  SARS-CoV-2 and other Sarbecovirus currently known to infect humans.  If clinically indicated additional testing with an alternate test  methodology [phone]) is advised. The SARS-CoV-2 RNA is generally  detectable in upper and lower respiratory sp ecimens during the acute  phase of infection. The expected result is Negative. Fact Sheet for Patients:  StrictlyIdeas.no Fact Sheet for Healthcare Providers: BankingDealers.co.za This test is not yet approved or cleared by the [place] FDA and has been authorized for detection and/or diagnosis of SARS-CoV-2 by FDA under an Emergency Use Authorization (EUA).  This EUA will remain in effect (meaning this test can be used) for the duration of the COVID-19 declaration under Section 564(b)(1) of the Act, 21 U.S.C. section 360bbb-3(b)(1), unless the authorization is terminated or revoked sooner. Performed at [facility], [address] [place], [place] [address]   Glucose, capillary     Status: Abnormal   Collection Time: 08/16/19  4:17 PM  Result Value Ref Range   Glucose-Capillary 102 (H) 70 - 99 mg/dL   No results found.  Blood pressure 90/66, pulse 88, temperature 98.4 F (36.9 C), temperature source Oral, resp. rate 16, height 5' 10.8" (1.798 m), weight 91.3 kg, SpO2 98 %.   Assessment 1. Osteomyelitis left fifth toe 2. Diabetes with polyneuropathy 3. History of PVD status post revascularization  Plan -Examined both feet. -Reviewed x-ray results with  patient showing osteomyelitis of the left fifth toe. -Discussed all treatment options with the patient both conservative and surgical attempts at correction and once the benefits and complications were discussed patient has elected for left fifth toe amputation.  All questions answered including postoperative course. -Patient has been n.p.o. since this morning. -Patient had procedure performed on 08/16/2019 at 5 PM.  Caroline More 08/16/2019, 4:50 PM

## 2019-08-16 NOTE — Progress Notes (Signed)
Home meds taken to pharmacy, scds applied

## 2019-08-16 NOTE — Anesthesia Preprocedure Evaluation (Signed)
Anesthesia Evaluation  Patient identified by MRN, date of birth, ID band Patient awake    Reviewed: Allergy & Precautions, H&P , NPO status , Patient's Chart, lab work & pertinent test results, reviewed documented beta blocker date and time   Airway Mallampati: II  TM Distance: >3 FB Neck ROM: full    Dental  (+) Edentulous Upper, Edentulous Lower   Pulmonary shortness of breath and with exertion, neg sleep apnea, COPD, neg recent URI, Current Smoker,           Cardiovascular Exercise Tolerance: Good hypertension, (-) angina+CHF  (-) Past MI and (-) Cardiac Stents (-) dysrhythmias (-) Valvular Problems/Murmurs     Neuro/Psych Seizures -, Well Controlled,  PSYCHIATRIC DISORDERS Anxiety Bipolar Disorder  Neuromuscular disease    GI/Hepatic Neg liver ROS, GERD  ,  Endo/Other  diabetes  Renal/GU negative Renal ROS  negative genitourinary   Musculoskeletal   Abdominal   Peds  Hematology negative hematology ROS (+)   Anesthesia Other Findings Past Medical History: No date: Chronic combined systolic and diastolic CHF (congestive  heart failure) (H[facility])     Comment:  a. ECHO 04/23/14 LV EF: 45% - 50%,  mild LVH, diffuse               hypokinesis. LV diastolic dysfunction, high ventricular               filling pressure. Mild LA dilation. No date: Cocaine abuse (HCC) No date: Diabetes mellitus without complication (HCC) No date: Hypertension No date: Seizures (HCC)   Reproductive/Obstetrics negative OB ROS                             Anesthesia Physical Anesthesia Plan  ASA: III  Anesthesia Plan: General   Post-op Pain Management:    Induction: Intravenous  PONV Risk Score and Plan: 2 and Ondansetron, Dexamethasone, Midazolam, Promethazine and Treatment may vary due to age or medical condition  Airway Management Planned: LMA  Additional Equipment:   Intra-op Plan:   Post-operative  Plan: Extubation in OR  Informed Consent: I have reviewed the patients History and Physical, chart, labs and discussed the procedure including the risks, benefits and alternatives for the proposed anesthesia with the patient or authorized representative who has indicated his/her understanding and acceptance.     Dental Advisory Given  Plan Discussed with: Anesthesiologist, CRNA and Surgeon  Anesthesia Plan Comments:         Anesthesia Quick Evaluation  

## 2019-08-16 NOTE — Anesthesia Postprocedure Evaluation (Signed)
Anesthesia Post Note  Patient: [name]  Procedure(s) Performed: AMPUTATION TOE 5th (Left Toe)  Patient location during evaluation: PACU Anesthesia Type: General Level of consciousness: awake and alert Pain management: pain level controlled Vital Signs Assessment: post-procedure vital signs reviewed and stable Respiratory status: spontaneous breathing, nonlabored ventilation, respiratory function stable and patient connected to nasal cannula oxygen Cardiovascular status: blood pressure returned to baseline and stable Postop Assessment: no apparent nausea or vomiting Anesthetic complications: no     Last Vitals:  Vitals:   08/16/19 2020 08/16/19 2118  BP: 102/79 107/71  Pulse: (!) 105 98  Resp: 19 18  Temp: 37 C 37.2 C  SpO2: 100% 100%    Last Pain:  Vitals:   08/16/19 1902  TempSrc:   PainSc: 0-No pain                 Martha Clan

## 2019-08-16 NOTE — Anesthesia Preprocedure Evaluation (Deleted)
Anesthesia Evaluation    Airway        Dental   Pulmonary Current Smoker,           Cardiovascular hypertension,      Neuro/Psych    GI/Hepatic   Endo/Other  diabetes  Renal/GU      Musculoskeletal   Abdominal   Peds  Hematology   Anesthesia Other Findings Past Medical History: No date: Chronic combined systolic and diastolic CHF (congestive  heart failure) ([place])     Comment:  a. ECHO 04/23/14 LV EF: 45% - 50%,  mild LVH, diffuse               hypokinesis. LV diastolic dysfunction, high ventricular               filling pressure. Mild LA dilation. No date: Cocaine abuse (HCC) No date: COPD (chronic obstructive pulmonary disease) (HCC) No date: Diabetes mellitus without complication ([place]) [id number]: Heroin overdose ([place]) No date: Hypertension No date: Nicotine dependence with current use No date: Seizures [facility])   Reproductive/Obstetrics                             Anesthesia Physical Anesthesia Plan Anesthesia Quick Evaluation

## 2019-08-16 NOTE — Progress Notes (Signed)
Advanced care plan.  Purpose of the Encounter: CODE STATUS  Parties in Attendance: Patient  Patient's Decision Capacity: Good  Subjective/Patient's story: 43 y.o. 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  Patient was recently discharged from the hospital on 03/28/2019 following admission for left great toe osteomyelitis s/p amputation and staph aureus bacteremia with mitral valve endocarditis. She followed with her podiatry in the clinic today and had x-ray of the left foot which showed acute osteomyelitis of the left foot.  Patient states that she has had percent drainage in her left fifth toe for 3 days.  Also noted swelling and redness without pain.  Patient denies fevers or chills.   On arrival to the unit, she was afebrile with blood pressure 90/66 mm Hg and pulse rate 97 beats/min. There were no focal neurological deficits; she was alert and oriented x4, and denied any pain. She is being admitted for antibiotics treatment and amputation tomorrow by Dr. Luana Shu.   Objective/Medical story Patient needs podiatry evaluation for left fifth toe osteomyelitis and possible amputation Needs broad-spectrum antibiotics and wound care   Goals of care determination:  Advance care directives goals of care treatment plan discussed Patient is full resuscitation   CODE STATUS: Full code   Time spent discussing advanced care planning: 16 minutes

## 2019-08-16 NOTE — Progress Notes (Signed)
[facility] NAME: [name]    MR#:  [id number]  DATE OF BIRTH:  1975/11/13  SUBJECTIVE:  Patient seen and evaluated today Has pain in the left fifth toe No fever No chest pain No shortness of breath  REVIEW OF SYSTEMS:    ROS  CONSTITUTIONAL: No documented fever. No fatigue, weakness. No weight gain, no weight loss.  EYES: No blurry or double vision.  ENT: No tinnitus. No postnasal drip. No redness of the oropharynx.  RESPIRATORY: No cough, no wheeze, no hemoptysis. No dyspnea.  CARDIOVASCULAR: No chest pain. No orthopnea. No palpitations. No syncope.  GASTROINTESTINAL: No nausea, no vomiting or diarrhea. No abdominal pain. No melena or hematochezia.  GENITOURINARY: No dysuria or hematuria.  ENDOCRINE: No polyuria or nocturia. No heat or cold intolerance.  HEMATOLOGY: No anemia. No bruising. No bleeding.  INTEGUMENTARY: No rashes. No lesions.  MUSCULOSKELETAL: No arthritis. 5 th toe swelling. No gout.  NEUROLOGIC: No numbness, tingling, or ataxia. No seizure-type activity.  PSYCHIATRIC: No anxiety. No insomnia. No ADD.   DRUG ALLERGIES:   Allergies  Allergen Reactions  . Albumin (Human)     unknown  . Erythromycin Rash  . Midecamycin Rash    VITALS:  Blood pressure 96/70, pulse 72, temperature 98.4 F (36.9 C), resp. rate 14, height 5' 10.8" (1.798 m), weight 91.3 kg, SpO2 98 %.  PHYSICAL EXAMINATION:   Physical Exam  GENERAL:  43 y.o.-year-old patient lying in the bed with no acute distress.  EYES: Pupils equal, round, reactive to light and accommodation. No scleral icterus. Extraocular muscles intact.  HEENT: Head atraumatic, normocephalic. Oropharynx and nasopharynx clear.  NECK:  Supple, no jugular venous distention. No thyroid enlargement, no tenderness.  LUNGS: Normal breath sounds bilaterally, no wheezing, rales, rhonchi. No use of accessory muscles of respiration.  CARDIOVASCULAR: S1, S2 normal. No  murmurs, rubs, or gallops.  ABDOMEN: Soft, nontender, nondistended. Bowel sounds present. No organomegaly or mass.  EXTREMITIES: No cyanosis, clubbing  5th toe swelling noted    NEUROLOGIC: Cranial nerves II through XII are intact. No focal Motor or sensory deficits b/l.   PSYCHIATRIC: The patient is alert and oriented x 3.  SKIN: redness and swelling left fifth toe       LABORATORY PANEL:   CBC Recent Labs  Lab 08/16/19 0419  WBC 11.0*  HGB 11.6*  HCT 34.2*  PLT 332   ------------------------------------------------------------------------------------------------------------------ Chemistries  Recent Labs  Lab 08/16/19 0419  NA 137  K 3.2*  CL 101  CO2 24  GLUCOSE 210*  BUN 38*  CREATININE 1.69*  CALCIUM 8.5*   ------------------------------------------------------------------------------------------------------------------  Cardiac Enzymes No results for input(s): TROPONINI in the last 168 hours. ------------------------------------------------------------------------------------------------------------------  RADIOLOGY:  No results found.   ASSESSMENT AND PLAN:   43 y.o. female with pertinent past medical history of COPD, DM, hypertension, cocaine abuse, Heroine overdose, seizure disorder, recent staph aureus bacteremia, and left great toe infection s/p amputation, and mitral valve endocarditis treated with 6 weeks of antibiotics came in as as direct admit from podiatry clinic with left fifth toe infection and necrosis and currently under hospitalist service  1. Left fifth toe osteomyelitis -patient with history of diabetes and recent left great toe osteomyelitis s/p amputation - Cultures pending - Continue IV vancomycin and cefepime antibiotic - Keep n.p.o as patient is due for amputation by podiatry - Podiatry consult appreciated  2. Diabetes mellitus with neuropathy -*Hold glipizide and Metformin -Continue Lantus insulin and  sliding scale coverage  with insulin -Diabetic diet after procedure  3. Acute kidney injury on Chronic CKD stage III-creatinine slightly elevated above baseline -Hold nephrotoxins -Continue to monitor renal function  4. Hyperlipidemia - Continue atorvastatin 20 mg  5. Hypertension - Continue metoprolol  6. GERD -Protonix  7. Depression and anxiety - Continue Remeron, amitriptyline, Effexor and quetiapine  8. COPD -no evidence of exacerbation  9. DVT prophylaxis -hold anticoagulation for surgery   All the records are reviewed and case discussed with Care Management/Social Worker. Management plans discussed with the patient, family and they are in agreement.  CODE STATUS: Full code  DVT Prophylaxis: SCDs  TOTAL TIME TAKING CARE OF THIS PATIENT: 38 minutes.   POSSIBLE D/C IN 2 to 3 DAYS, DEPENDING ON CLINICAL CONDITION.  Saundra Shelling M.D on 08/16/2019 at 10:53 AM  Between 7am to 6pm - Pager - [phone]  After 6pm go to [email or web address] - password [email or web address] [facility]  Office  [phone]  CC: Primary care physician; Lacie Draft, NP  Note: This dictation was prepared with Dragon dictation along with smaller phrase technology. Any transcriptional errors that result from this process are unintentional.

## 2019-08-16 NOTE — Progress Notes (Signed)
BP 90/66. Ouma-NP made aware. No new orders received at this time.

## 2019-08-16 NOTE — Plan of Care (Signed)
  Problem: Pain Managment: Goal: General experience of comfort will improve Outcome: Progressing  Pt has c/o 6/10 bilateral mid lower back pain describing it as a constant pressure.  Reiterated pain scale so she could adequately rate her pain.  Pt stated her pain goal this admission would be 0/10.  Discussed nonpharmacological methods to help reduce s/sx of pain.  Interventions given per pt's request and MD's orders.    Problem: Safety: Goal: Ability to remain free from injury will improve Outcome: Progressing  Pt has remained free from falls thus far.  Instructed pt to utilize RN call light for assistance.  Bed alarm implemented to keep pt safe from falls.  Settings set to third most sensitive settings.  Hourly rounds performed.  Pt has her yellow nonskid socks on when OOB.  She was observed to have a steady gait when OOB ambulating to the bathroom.  Bed in lowest position, locked with two upper side rails engaged.  Problem: Skin Integrity: Goal: Risk for impaired skin integrity will decrease Outcome: Progressing Skin integrity monitored and assessed q-shift/ PRN.  Instructed pt to turn q2 hours to prevent further skin impairment.  Will continue to monitor and assess.

## 2019-08-16 NOTE — Anesthesia Procedure Notes (Signed)
Procedure Name: LMA Insertion Date/Time: 08/16/2019 5:17 PM Performed by: Lowry Bowl, CRNA Pre-anesthesia Checklist: Patient identified, Emergency Drugs available, Suction available, Patient being monitored and Timeout performed Patient Re-evaluated:Patient Re-evaluated prior to induction Oxygen Delivery Method: Circle system utilized Preoxygenation: Pre-oxygenation with 100% oxygen Induction Type: IV induction LMA: LMA inserted LMA Size: 4.0 Number of attempts: 2 Placement Confirmation: positive ETCO2 and breath sounds checked- equal and bilateral Tube secured with: Tape Dental Injury: Teeth and Oropharynx as per pre-operative assessment

## 2019-08-17 ENCOUNTER — Encounter: Payer: Self-pay | Admitting: Podiatry

## 2019-08-17 LAB — CBC
HCT: 31.8 % — ABNORMAL LOW (ref 36.0–46.0)
Hemoglobin: 10.8 g/dL — ABNORMAL LOW (ref 12.0–15.0)
MCH: 30.2 pg (ref 26.0–34.0)
MCHC: 34 g/dL (ref 30.0–36.0)
MCV: 88.8 fL (ref 80.0–100.0)
Platelets: 328 10*3/uL (ref 150–400)
RBC: 3.58 MIL/uL — ABNORMAL LOW (ref 3.87–5.11)
RDW: 12.8 % (ref 11.5–15.5)
WBC: 9.7 10*3/uL (ref 4.0–10.5)
nRBC: 0 % (ref 0.0–0.2)

## 2019-08-17 LAB — BASIC METABOLIC PANEL
Anion gap: 8 (ref 5–15)
BUN: 26 mg/dL — ABNORMAL HIGH (ref 6–20)
CO2: 23 mmol/L (ref 22–32)
Calcium: 8.3 mg/dL — ABNORMAL LOW (ref 8.9–10.3)
Chloride: 105 mmol/L (ref 98–111)
Creatinine, Ser: 1.4 mg/dL — ABNORMAL HIGH (ref 0.44–1.00)
GFR calc Af Amer: 53 mL/min — ABNORMAL LOW (ref >60)
GFR calc non Af Amer: 46 mL/min — ABNORMAL LOW (ref >60)
Glucose, Bld: 312 mg/dL — ABNORMAL HIGH (ref 70–99)
Potassium: 3.8 mmol/L (ref 3.5–5.1)
Sodium: 136 mmol/L (ref 135–145)

## 2019-08-17 LAB — GLUCOSE, CAPILLARY
Glucose-Capillary: 308 mg/dL — ABNORMAL HIGH (ref 70–99)
Glucose-Capillary: 116 mg/dL — ABNORMAL HIGH (ref 70–99)
Glucose-Capillary: 171 mg/dL — ABNORMAL HIGH (ref 70–99)
Glucose-Capillary: 195 mg/dL — ABNORMAL HIGH (ref 70–99)

## 2019-08-17 MED ORDER — ACETAMINOPHEN 325 MG PO TABS
650.0000 mg | ORAL_TABLET | Freq: Four times a day (QID) | ORAL | Status: DC | PRN
  Administered 2019-08-17 – 2019-08-18 (×3): 650 mg via ORAL
  Filled 2019-08-17 (×3): qty 2

## 2019-08-17 MED ORDER — TRAMADOL HCL 50 MG PO TABS: 50.0000 mg | ORAL_TABLET | Freq: Four times a day (QID) | ORAL | Status: DC | PRN

## 2019-08-17 MED ORDER — ENOXAPARIN SODIUM 40 MG/0.4ML ~~LOC~~ SOLN
40.0000 mg | SUBCUTANEOUS | Status: DC
  Administered 2019-08-17 – 2019-08-18 (×2): 40 mg via SUBCUTANEOUS
  Filled 2019-08-17 (×2): qty 0.4

## 2019-08-17 MED ORDER — INSULIN GLARGINE 100 UNIT/ML ~~LOC~~ SOLN
25.0000 [IU] | Freq: Two times a day (BID) | SUBCUTANEOUS | Status: DC
  Administered 2019-08-17 – 2019-08-18 (×3): 25 [IU] via SUBCUTANEOUS
  Filled 2019-08-17 (×4): qty 0.25

## 2019-08-17 MED ORDER — INSULIN ASPART 100 UNIT/ML ~~LOC~~ SOLN: 0.0000 [IU] | Freq: Every day | SUBCUTANEOUS | Status: DC

## 2019-08-17 MED ORDER — INSULIN ASPART 100 UNIT/ML ~~LOC~~ SOLN
0.0000 [IU] | Freq: Three times a day (TID) | SUBCUTANEOUS | Status: DC
  Administered 2019-08-17: 11 [IU] via SUBCUTANEOUS
  Administered 2019-08-17: 3 [IU] via SUBCUTANEOUS
  Administered 2019-08-18: 5 [IU] via SUBCUTANEOUS
  Filled 2019-08-17 (×3): qty 1

## 2019-08-17 MED ORDER — INSULIN GLARGINE 100 UNIT/ML ~~LOC~~ SOLN
25.0000 [IU] | Freq: Every day | SUBCUTANEOUS | Status: DC
  Filled 2019-08-17: qty 0.25

## 2019-08-17 MED ORDER — INSULIN GLARGINE 100 UNIT/ML ~~LOC~~ SOLN: 20.0000 [IU] | Freq: Two times a day (BID) | SUBCUTANEOUS | Status: DC

## 2019-08-17 MED ORDER — INSULIN ASPART 100 UNIT/ML ~~LOC~~ SOLN: 0.0000 [IU] | Freq: Three times a day (TID) | SUBCUTANEOUS | Status: DC

## 2019-08-17 NOTE — Evaluation (Signed)
Physical Therapy Evaluation Patient Details Name: [name] MRN: [id number] DOB: June 07, 1976 Today's Date: 08/17/2019   History of Present Illness  43 yo female with onset of amputation to 5th toe on L foot due to osteomyelitis after L great toe amp in May 2020.  Has PMHx:  CHF, cocaine abuse, COPD, heroin overdose, nicotine dependence, seizures, DM, TEE, LE angiography, polyneuropathy, PVD, revascularization for LLE,   Clinical Impression  Pt was seen for mobility and strength with ongoing effort to work on controlling balance as pt has oscillating tone and a history of LE severe weakness from a situation in the past where she was found down on the driveway at home from an unknown reason.  Pt is motivated to work but fearful of new surgery and trying stairs today.  Plan to get up steps tomorrow, with family in attendance as is possible for carryover.  Home therapy to follow up then outpatient if needed.    Follow Up Recommendations Home health PT;Supervision for mobility/OOB    Equipment Recommendations  Rolling walker with 5" wheels(if needed)    Recommendations for Other Services       Precautions / Restrictions Precautions Precautions: Fall Precaution Comments: pt has oscillating L hip tone, athetoid quality movement of UE Restrictions Weight Bearing Restrictions: Yes LLE Weight Bearing: Partial weight bearing LLE Partial Weight Bearing Percentage or Pounds: PWB with cast shoe on L foot, through heel only      Mobility  Bed Mobility Overal bed mobility: Needs Assistance Bed Mobility: Supine to Sit;Sit to Supine     Supine to sit: Min guard;Min assist Sit to supine: Supervision   General bed mobility comments: min guard to support effort OOB and to move LLE  Transfers Overall transfer level: Needs assistance Equipment used: Rolling walker (2 wheeled);1 person hand held assist Transfers: Sit to/from Stand Sit to Stand: Min guard;Min assist         General  transfer comment: min assist for first transfer then min guard  Ambulation/Gait Ambulation/Gait assistance: Min guard Gait Distance (Feet): 70 Feet(35 x 2) Assistive device: Rolling walker (2 wheeled);1 person hand held assist Gait Pattern/deviations: Step-through pattern;Decreased stride length;Decreased weight shift to left;Wide base of support Gait velocity: reduced Gait velocity interpretation: <1.31 ft/sec, indicative of household ambulator General Gait Details: pt was assisted to maintain precautions on L foot with cues for safety and controlling WB  Stairs Stairs: (declined)          Wheelchair Mobility    Modified Rankin (Stroke Patients Only)       Balance Overall balance assessment: Needs assistance;History of Falls Sitting-balance support: Feet supported Sitting balance-Leahy Scale: Good     Standing balance support: Bilateral upper extremity supported;During functional activity Standing balance-Leahy Scale: Fair Standing balance comment: less than fair dynamic balance                             Pertinent Vitals/Pain Pain Assessment: Faces Faces Pain Scale: Hurts little more Pain Location: L foot Pain Descriptors / Indicators: Operative site guarding Pain Intervention(s): Limited activity within patient's tolerance;Monitored during session;Premedicated before session;Repositioned(elevated on pillows)    Home Living Family/patient expects to be discharged to:: Private residence Living Arrangements: Other relatives Available Help at Discharge: Family Type of Home: Mobile home Home Access: Stairs to enter Entrance Stairs-Rails: Can reach both Entrance Stairs-Number of Steps: ramp, 4 stairs Home Layout: One level Home Equipment: Clinical cytogeneticist - 2 wheels;Wheelchair - manual  Additional Comments: walker inside and chair outside    Prior Function Level of Independence: Needs assistance   Gait / Transfers Assistance Needed: walker inside  and wc outside  ADL's / Homemaking Assistance Needed: has family help wtih housework        Hand Dominance   Dominant Hand: Right    Extremity/Trunk Assessment   Upper Extremity Assessment Upper Extremity Assessment: Overall WFL for tasks assessed    Lower Extremity Assessment Lower Extremity Assessment: LLE deficits/detail LLE Deficits / Details: LLE weak on knee and ankle NT due to new surgery    Cervical / Trunk Assessment Cervical / Trunk Assessment: Normal  Communication   Communication: No difficulties  Cognition Arousal/Alertness: Awake/alert Behavior During Therapy: WFL for tasks assessed/performed Overall Cognitive Status: Within Functional Limits for tasks assessed                                 General Comments: pt is using goodjudgment about distances for gait      General Comments General comments (skin integrity, edema, etc.): pt was seen for controlling balance on RW, to instruct sequence with walker and for safety of transfers with her mother in attendance    Exercises     Assessment/Plan    PT Assessment Patient needs continued PT services  PT Problem List Decreased strength;Decreased range of motion;Decreased activity tolerance;Decreased balance;Decreased mobility;Decreased coordination;Pain;Decreased skin integrity       PT Treatment Interventions DME instruction;Gait training;Stair training;Functional mobility training;Therapeutic activities;Therapeutic exercise;Balance training;Neuromuscular re-education;Patient/family education    PT Goals (Current goals can be found in the Care Plan section)  Acute Rehab PT Goals Patient Stated Goal: to walk and get home PT Goal Formulation: With patient/family Time For Goal Achievement: 08/31/19 Potential to Achieve Goals: Good    Frequency 7X/week   Barriers to discharge Inaccessible home environment;Decreased caregiver support home with family with stairs to enter her house     Co-evaluation               AM-PAC PT "6 Clicks" Mobility  Outcome Measure Help needed turning from your back to your side while in a flat bed without using bedrails?: None Help needed moving from lying on your back to sitting on the side of a flat bed without using bedrails?: A Little Help needed moving to and from a bed to a chair (including a wheelchair)?: A Little Help needed standing up from a chair using your arms (e.g., wheelchair or bedside chair)?: A Little Help needed to walk in hospital room?: A Little Help needed climbing 3-5 steps with a railing? : A Lot 6 Click Score: 18    End of Session Equipment Utilized During Treatment: Gait belt Activity Tolerance: Patient limited by fatigue;Treatment limited secondary to medical complications (Comment) Patient left: in bed;with call bell/phone within reach;with family/visitor present Nurse Communication: Mobility status PT Visit Diagnosis: Unsteadiness on feet (R26.81);Muscle weakness (generalized) (M62.81);Pain Pain - Right/Left: Left Pain - part of body: Ankle and joints of foot    Time: [id number] PT Time Calculation (min) (ACUTE ONLY): 26 min   Charges:   PT Evaluation $PT Eval Moderate Complexity: 1 Mod PT Treatments $Gait Training: 8-22 mins       Ramond Dial 08/17/2019, 5:05 PM   Mee Hives, PT MS Acute Rehab Dept. Number: [place] and [place]

## 2019-08-17 NOTE — Discharge Instructions (Signed)
Podiatry discharge instructions: 1.  Keep surgical dressings clean, dry, and intact until your first postoperative visit.  If dressings become saturated or loosened, please call the office for instructions. 2.  Try to stay off of your left foot is much as possible.  You may weight-bear with heel contact only to the left foot and surgical shoe at all times. 3.  Elevate your left lower extremity when at rest on 2 pillows.  Try to elevate above your heart when sleeping to reduce swelling. 4.  Take postop pain medication and antibiotics as prescribed. 5.  Follow-up in clinic in 1 week for incision check.

## 2019-08-17 NOTE — H&P (Addendum)
PODIATRY H&P  Reason for consult: L 5th toe infection  Chief Complaint: L 5th toe infection, sore   HPI: [name] is a 43 y.o. female who presents s/p 1d L 5th toe amputation.  Patient states that she has mild to moderate pain to the left foot after the amputation.  She states that she is currently taking Tylenol but feels she might need something stronger.  Patient states that she has left her dressings clean, dry, and intact since the procedure and has been partial weightbearing with heel contact only in a surgical shoe.  She denies any other complaints at this time and is feeling much better since her admission.  PMHx:  Past Medical History:  Diagnosis Date  . Chronic combined systolic and diastolic CHF (congestive heart failure) ([place])    a. ECHO 04/23/14 LV EF: 45% - 50%,  mild LVH, diffuse hypokinesis. LV diastolic dysfunction, high ventricular filling pressure. Mild LA dilation.  . Cocaine abuse ([place])   . COPD (chronic obstructive pulmonary disease) ([place])   . Diabetes mellitus without complication ([place])   . Heroin overdose ([place]) 10/2018  . Hypertension   . Nicotine dependence with current use   . Seizures ([place])     Surgical Hx:  Past Surgical History:  Procedure Laterality Date  . AMPUTATION TOE Left 03/23/2019   Procedure: AMPUTATION TOE GREAT LEFT TOE;  Surgeon: [name] Patricia, DPM;  Location: [facility] ORS;  Service: Podiatry;  Laterality: Left;  . AMPUTATION TOE Left 08/16/2019   Procedure: AMPUTATION TOE 5th;  Surgeon: Caroline More, DPM;  Location: [facility] ORS;  Service: Podiatry;  Laterality: Left;  . CHOLECYSTECTOMY  2005  . LOWER EXTREMITY ANGIOGRAPHY Left 03/12/2019   Procedure: LOWER EXTREMITY ANGIOGRAPHY;  Surgeon: Katha Cabal, MD;  Location: [facility];  Service: Cardiovascular;  Laterality: Left;  . TEE WITHOUT CARDIOVERSION N/A 03/26/2019   Procedure: TRANSESOPHAGEAL ECHOCARDIOGRAM (TEE);  Surgeon: Nelva Bush, MD;  Location: [facility] OR[facility];  Service:  Cardiovascular;  Laterality: N/A;  . TUBAL LIGATION      FHx:  Family History  Problem Relation Age of Onset  . Hypertension Other   . Drug abuse Mother   . Seizures Neg Hx     Social History:  reports that she has been smoking cigarettes. She has a 28.00 pack-year smoking history. She has never used smokeless tobacco. She reports previous alcohol use. She reports previous drug use.  Allergies:  Allergies  Allergen Reactions  . Albumin (Human)     unknown  . Erythromycin Rash  . Midecamycin Rash    Review of Systems: General ROS: negative Psychological ROS: negative Hematological and Lymphatic ROS: negative Respiratory ROS: no cough, shortness of breath, or wheezing Cardiovascular ROS: no chest pain or dyspnea on exertion Gastrointestinal ROS: no abdominal pain, change in bowel habits, or black or bloody stools Musculoskeletal ROS: positive for - joint pain, joint stiffness and joint swelling Neurological ROS: positive for - numbness/tingling Dermatological ROS: positive for Left fifth ray incision  Medications Prior to Admission  Medication Sig Dispense Refill  . amitriptyline (ELAVIL) 150 MG tablet Take 150 mg by mouth at bedtime.    Marland Kitchen aspirin EC 81 MG tablet Take 81 mg by mouth daily.    Marland Kitchen atorvastatin (LIPITOR) 20 MG tablet Take 20 mg by mouth at bedtime.     . bethanechol (URECHOLINE) 25 MG tablet Take 50 mg by mouth 3 (three) times daily.    . furosemide (LASIX) 20 MG tablet Take 20 mg  by mouth 2 (two) times a day.    Marland Kitchen glipiZIDE (GLUCOTROL) 10 MG tablet Take 1 tablet by mouth every morning.    . hydrOXYzine (ATARAX/VISTARIL) 50 MG tablet Take 50 mg by mouth 2 (two) times a day.   0  . insulin aspart (NOVOLOG) 100 UNIT/ML injection Inject 4-14 Units into the skin 3 (three) times daily as needed for high blood sugar.    . insulin glargine (LANTUS) 100 UNIT/ML injection Inject 40 Units into the skin 2 (two) times a day.    . metFORMIN (GLUCOPHAGE) 1000 MG tablet Take  1,000 mg by mouth 2 (two) times daily with a meal.    . metoprolol tartrate (LOPRESSOR) 50 MG tablet Take 25 mg by mouth at bedtime.     . mirtazapine (REMERON) 15 MG tablet Take 7.5-15 mg by mouth at bedtime.    . pantoprazole (PROTONIX) 20 MG tablet Take 20 mg by mouth daily.     . prazosin (MINIPRESS) 5 MG capsule Take 5 mg by mouth at bedtime.    . promethazine (PHENERGAN) 12.5 MG tablet Take 12.5 mg by mouth 2 (two) times daily as needed for nausea or vomiting.   3  . QUEtiapine (SEROQUEL) 400 MG tablet Take 800 mg by mouth at bedtime.    . tamsulosin (FLOMAX) 0.4 MG CAPS capsule TAKE 1 CAPSULE PO BID 30 MINUTES AFTER A MEAL    . venlafaxine XR (EFFEXOR-XR) 150 MG 24 hr capsule Take 300 mg by mouth daily.   3    Physical Exam: General: Alert and oriented.  No apparent distress. Vascular: DP/PT pulses palpable bilateral.  CFT intact to digits bilateral.  Erythema and edema to the left fifth toe decrease in submission Neuro: Light touch sensation diminished to digits bilateral. Derm: Incision site to the distal left fifth metatarsal appears to be intact with sutures intact, mild serous drainage present, decreased erythema and edema present since the procedure.    MSK: Mild pain on palpation left fifth toe.  Left hallux, left fifth toe amputation.  Results for orders placed or performed during the hospital encounter of 08/15/19 (from the past 48 hour(s))  Anaerobic culture     Status: None (Preliminary result)   Collection Time: 08/15/19  6:02 PM   Specimen: Wound  Result Value Ref Range   Specimen Description WOUND TOE    Special Requests LEFT FIFTH    Culture      HOLDING FOR POSSIBLE ANAEROBE Performed at [facility], [address], [place], [place] [address]    Report Status PENDING   Aerobic Culture (superficial specimen)     Status: None (Preliminary result)   Collection Time: 08/15/19  6:02 PM   Specimen: Wound  Result Value Ref Range   Specimen Description       WOUND TOE Performed at [facility], [place] [address] [place], [place] [address]    Special Requests      LEFT FIFTH Performed at [facility], [place], [place] [address]    Gram Stain      NO WBC SEEN NO ORGANISMS SEEN Performed at [facility], [place] [address], [name],  [address]    Culture ABUNDANT STAPHYLOCOCCUS AUREUS    Report Status PENDING   Urinalysis, Complete w Microscopic     Status: Abnormal   Collection Time: 08/15/19  7:31 PM  Result Value Ref Range   Color, Urine YELLOW (A) YELLOW   APPearance CLEAR (A)  CLEAR   Specific Gravity, Urine 1.015 1.005 - 1.030   pH 5.0 5.0 - 8.0   Glucose, UA >=500 (A) NEGATIVE mg/dL   Hgb urine dipstick NEGATIVE NEGATIVE   Bilirubin Urine NEGATIVE NEGATIVE   Ketones, ur NEGATIVE NEGATIVE mg/dL   Protein, ur NEGATIVE NEGATIVE mg/dL   Nitrite NEGATIVE NEGATIVE   Leukocytes,Ua TRACE (A) NEGATIVE   RBC / HPF 0-5 0 - 5 RBC/hpf   WBC, UA 6-10 0 - 5 WBC/hpf   Bacteria, UA RARE (A) NONE SEEN   Squamous Epithelial / LPF 0-5 0 - 5   Mucus PRESENT    Hyaline Casts, UA PRESENT     Comment: Performed at [facility] [address] [place], [place] [address]  Urine Drug Screen, Qualitative ([facility] only)     Status: Abnormal   Collection Time: 08/15/19  7:31 PM  Result Value Ref Range   Tricyclic, Ur Screen POSITIVE (A) NONE DETECTED   Amphetamines, Ur Screen NONE DETECTED NONE DETECTED   MDMA (Ecstasy)Ur Screen NONE DETECTED NONE DETECTED   Cocaine Metabolite,Ur [place] NONE DETECTED NONE DETECTED   Opiate, Ur Screen NONE DETECTED NONE DETECTED   Phencyclidine (PCP) Ur S NONE DETECTED NONE DETECTED   Cannabinoid 50 Ng, Ur [place] NONE DETECTED NONE DETECTED   Barbiturates, Ur Screen NONE DETECTED NONE DETECTED   Benzodiazepine, Ur Scrn NONE DETECTED NONE DETECTED   Methadone Scn, Ur NONE DETECTED NONE DETECTED    Comment: (NOTE) Tricyclics + metabolites, urine    Cutoff 1000 ng/mL  Amphetamines + metabolites, urine  Cutoff 1000 ng/mL MDMA (Ecstasy), urine              Cutoff 500 ng/mL Cocaine Metabolite, urine          Cutoff 300 ng/mL Opiate + metabolites, urine        Cutoff 300 ng/mL Phencyclidine (PCP), urine         Cutoff 25 ng/mL Cannabinoid, urine                 Cutoff 50 ng/mL Barbiturates + metabolites, urine  Cutoff 200 ng/mL Benzodiazepine, urine              Cutoff 200 ng/mL Methadone, urine                   Cutoff 300 ng/mL The urine drug screen provides only a preliminary, unconfirmed analytical test result and should not be used for non-medical purposes. Clinical consideration and professional judgment should be applied to any positive drug screen result due to possible interfering substances. A more specific alternate chemical method must be used in order to obtain a confirmed analytical result. Gas chromatography / mass spectrometry (GC/MS) is the preferred confirmat ory method. Performed at [facility], [address] [place], [place] [address]   CULTURE, BLOOD (ROUTINE X 2) w Reflex to ID Panel     Status: None (Preliminary result)   Collection Time: 08/15/19  7:51 PM   Specimen: BLOOD  Result Value Ref Range   Specimen Description BLOOD BLOOD LEFT FOREARM    Special Requests      BOTTLES DRAWN AEROBIC AND ANAEROBIC Blood Culture results may not be optimal due to an inadequate volume of blood received in culture bottles   Culture      NO GROWTH 2 DAYS Performed at [facility], [address] [place], [place] [address]    Report Status PENDING   CULTURE, BLOOD (ROUTINE X 2) w Reflex  to ID Panel     Status: None (Preliminary result)   Collection Time: 08/15/19  7:51 PM   Specimen: BLOOD  Result Value Ref Range   Specimen Description BLOOD RIGHT ANTECUBITAL    Special Requests      BOTTLES DRAWN AEROBIC ONLY Blood Culture results may not be optimal due to an inadequate volume of blood received in culture bottles    Culture      NO GROWTH 2 DAYS Performed at [facility], [address] [place], [place] [address]    Report Status PENDING   SAR CoV2 Serology (COVID 19)AB(IGG)IA     Status: None   Collection Time: 08/15/19  8:42 PM  Result Value Ref Range   SARS-CoV-2 Ab, IgG NON REACTIVE NON REACTIVE    Comment: (NOTE) Non-Reactive for SARS-CoV-2 IgG Antibodies.  SARS-CoV-2 IgG antibodies not detected.  Negative results do not preclude acute SARS-CoV-2 infection.  Negative results may occur in samples collected too soon following infection or in immunosuppressed patients.  Serologic results should not be used to diagnose or exclude active/recent SARS-CoV-2 infection.  If acute infection is suspected, direct testing for SARS-CoV-2 is necessary.   The expected result is Non-Reactive.  Fact Sheet for Recipients:  LimitBuy.nl  Fact Sheet for Healthcare Providers:  WordAgents.no  Testing was performed using the Beckman Coulter SARS-CoV-2 IgG assay.  This test is not yet approved or cleared by the [place] and has been authorized by FDA under an Emergency Use Authorization (EUA).  This EUA will remain in effect (meaning this test can be used) for the duration of the COVID-19 declaration under Section 564(b)(1) of the  Act, 21 U.S.C. section 360bbb-3(b)(1), unless the authorization is terminated or revoked sooner. Performed at [facility], [address] [place], [place] [address]   Creatinine, serum     Status: Abnormal   Collection Time: 08/15/19  8:42 PM  Result Value Ref Range   Creatinine, Ser 2.02 (H) 0.44 - 1.00 mg/dL   GFR calc non Af Amer 29 (L) >60 mL/min   GFR calc Af Amer 34 (L) >60 mL/min    Comment: Performed at [facility], [place] [place], [place] [address]  Glucose, capillary     Status: Abnormal   Collection Time: 08/15/19  9:51 PM  Result Value Ref Range    Glucose-Capillary 305 (H) 70 - 99 mg/dL  CBC with Differential/Platelet     Status: Abnormal   Collection Time: 08/16/19  4:19 AM  Result Value Ref Range   WBC 11.0 (H) 4.0 - 10.5 K/uL   RBC 3.83 (L) 3.87 - 5.11 MIL/uL   Hemoglobin 11.6 (L) 12.0 - 15.0 g/dL   HCT 34.2 (L) 36.0 - 46.0 %   MCV 89.3 80.0 - 100.0 fL   MCH 30.3 26.0 - 34.0 pg   MCHC 33.9 30.0 - 36.0 g/dL   RDW 12.6 11.5 - 15.5 %   Platelets 332 150 - 400 K/uL   nRBC 0.0 0.0 - 0.2 %   Neutrophils Relative % 69 %   Neutro Abs 7.8 (H) 1.7 - 7.7 K/uL   Lymphocytes Relative 18 %   Lymphs Abs 1.9 0.7 - 4.0 K/uL   Monocytes Relative 8 %   Monocytes Absolute 0.9 0.1 - 1.0 K/uL   Eosinophils Relative 3 %   Eosinophils Absolute 0.3 0.0 - 0.5 K/uL   Basophils Relative 1 %   Basophils Absolute 0.1 0.0 - 0.1 K/uL   Immature Granulocytes  1 %   Abs Immature Granulocytes 0.05 0.00 - 0.07 K/uL    Comment: Performed at [facility], [address] [place], [place] [id number]  Basic metabolic panel     Status: Abnormal   Collection Time: 08/16/19  4:19 AM  Result Value Ref Range   Sodium 137 135 - 145 mmol/L   Potassium 3.2 (L) 3.5 - 5.1 mmol/L   Chloride 101 98 - 111 mmol/L   CO2 24 22 - 32 mmol/L   Glucose, Bld 210 (H) 70 - 99 mg/dL   BUN 38 (H) 6 - 20 mg/dL   Creatinine, Ser 1.69 (H) 0.44 - 1.00 mg/dL   Calcium 8.5 (L) 8.9 - 10.3 mg/dL   GFR calc non Af Amer 36 (L) >60 mL/min   GFR calc Af Amer 42 (L) >60 mL/min   Anion gap 12 5 - 15    Comment: Performed at [facility], [address] [place], [place] [address]  Glucose, capillary     Status: Abnormal   Collection Time: 08/16/19  7:22 AM  Result Value Ref Range   Glucose-Capillary 244 (H) 70 - 99 mg/dL  Glucose, capillary     Status: Abnormal   Collection Time: 08/16/19 11:40 AM  Result Value Ref Range   Glucose-Capillary 175 (H) 70 - 99 mg/dL  Surgical PCR screen     Status: Abnormal   Collection Time: 08/16/19 12:04 PM   Specimen:  Nasopharyngeal Swab; Nasal Swab  Result Value Ref Range   MRSA, PCR NEGATIVE NEGATIVE   Staphylococcus aureus POSITIVE (A) NEGATIVE    Comment: (NOTE) The Xpert SA Assay (FDA approved for NASAL specimens in patients 80 years of age and older), is one component of a comprehensive surveillance program. It is not intended to diagnose infection nor to guide or monitor treatment. Performed at [facility], [address] [place], [place] [address]   SARS Coronavirus 2 by RT PCR (hospital order, performed in [facility] hospital lab) Nasopharyngeal Nasopharyngeal Swab     Status: None   Collection Time: 08/16/19 12:04 PM   Specimen: Nasopharyngeal Swab  Result Value Ref Range   SARS Coronavirus 2 NEGATIVE NEGATIVE    Comment: (NOTE) If result is NEGATIVE SARS-CoV-2 target nucleic acids are NOT DETECTED. The SARS-CoV-2 RNA is generally detectable in upper and lower  respiratory specimens during the acute phase of infection. The lowest  concentration of SARS-CoV-2 viral copies this assay can detect is 250  copies / mL. A negative result does not preclude SARS-CoV-2 infection  and should not be used as the sole basis for treatment or other  patient management decisions.  A negative result may occur with  improper specimen collection / handling, submission of specimen other  than nasopharyngeal swab, presence of viral mutation(s) within the  areas targeted by this assay, and inadequate number of viral copies  (<250 copies / mL). A negative result must be combined with clinical  observations, patient history, and epidemiological information. If result is POSITIVE SARS-CoV-2 target nucleic acids are DETECTED. The SARS-CoV-2 RNA is generally detectable in upper and lower  respiratory specimens dur ing the acute phase of infection.  Positive  results are indicative of active infection with SARS-CoV-2.  Clinical  correlation with patient history and other diagnostic information is   necessary to determine patient infection status.  Positive results do  not rule out bacterial infection or co-infection with other viruses. If result is PRESUMPTIVE POSTIVE SARS-CoV-2 nucleic acids MAY BE PRESENT.  A presumptive positive result was obtained on the submitted specimen  and confirmed on repeat testing.  While 2019 novel coronavirus  (SARS-CoV-2) nucleic acids may be present in the submitted sample  additional confirmatory testing may be necessary for epidemiological  and / or clinical management purposes  to differentiate between  SARS-CoV-2 and other Sarbecovirus currently known to infect humans.  If clinically indicated additional testing with an alternate test  methodology [phone]) is advised. The SARS-CoV-2 RNA is generally  detectable in upper and lower respiratory sp ecimens during the acute  phase of infection. The expected result is Negative. Fact Sheet for Patients:  StrictlyIdeas.no Fact Sheet for Healthcare Providers: BankingDealers.co.za This test is not yet approved or cleared by the [place] FDA and has been authorized for detection and/or diagnosis of SARS-CoV-2 by FDA under an Emergency Use Authorization (EUA).  This EUA will remain in effect (meaning this test can be used) for the duration of the COVID-19 declaration under Section 564(b)(1) of the Act, 21 U.S.C. section 360bbb-3(b)(1), unless the authorization is terminated or revoked sooner. Performed at [facility], [address] [place], [place] [address]   Glucose, capillary     Status: Abnormal   Collection Time: 08/16/19  4:17 PM  Result Value Ref Range   Glucose-Capillary 102 (H) 70 - 99 mg/dL  Aerobic/Anaerobic Culture (surgical/deep wound)     Status: None (Preliminary result)   Collection Time: 08/16/19  5:37 PM   Specimen: PATH Digit amputation; Tissue  Result Value Ref Range   Specimen Description      TISSUE LEFT FIFTH  TOE Performed at [place] [address], [place], [place] [address]    Special Requests      NONE Performed at [facility], [place], [place] [address]    Gram Stain      FEW WBC PRESENT,BOTH PMN AND MONONUCLEAR FEW GRAM POSITIVE COCCI FEW GRAM NEGATIVE RODS    Culture      TOO YOUNG TO READ Performed at [facility], [place] [address] [place],  [address]    Report Status PENDING   Glucose, capillary     Status: Abnormal   Collection Time: 08/16/19  6:06 PM  Result Value Ref Range   Glucose-Capillary 104 (H) 70 - 99 mg/dL  Glucose, capillary     Status: Abnormal   Collection Time: 08/16/19  8:58 PM  Result Value Ref Range   Glucose-Capillary 217 (H) 70 - 99 mg/dL   Comment 1 Notify RN   Basic metabolic panel     Status: Abnormal   Collection Time: 08/17/19  4:21 AM  Result Value Ref Range   Sodium 136 135 - 145 mmol/L   Potassium 3.8 3.5 - 5.1 mmol/L   Chloride 105 98 - 111 mmol/L   CO2 23 22 - 32 mmol/L   Glucose, Bld 312 (H) 70 - 99 mg/dL   BUN 26 (H) 6 - 20 mg/dL   Creatinine, Ser 1.40 (H) 0.44 - 1.00 mg/dL   Calcium 8.3 (L) 8.9 - 10.3 mg/dL   GFR calc non Af Amer 46 (L) >60 mL/min   GFR calc Af Amer 53 (L) >60 mL/min   Anion gap 8 5 - 15    Comment: Performed at [facility], [address] [place],  [address]  CBC     Status: Abnormal   Collection Time: 08/17/19  4:21 AM  Result Value Ref Range   WBC 9.7  4.0 - 10.5 K/uL   RBC 3.58 (L) 3.87 - 5.11 MIL/uL   Hemoglobin 10.8 (L) 12.0 - 15.0 g/dL   HCT 31.8 (L) 36.0 - 46.0 %   MCV 88.8 80.0 - 100.0 fL   MCH 30.2 26.0 - 34.0 pg   MCHC 34.0 30.0 - 36.0 g/dL   RDW 12.8 11.5 - 15.5 %   Platelets 328 150 - 400 K/uL   nRBC 0.0 0.0 - 0.2 %    Comment: Performed at [facility], [address] [place], [place] [address]  Glucose, capillary     Status: Abnormal   Collection Time: 08/17/19  7:30 AM  Result Value Ref Range   Glucose-Capillary  308 (H) 70 - 99 mg/dL  Glucose, capillary     Status: Abnormal   Collection Time: 08/17/19 12:39 PM  Result Value Ref Range   Glucose-Capillary 116 (H) 70 - 99 mg/dL   Dg Foot 2 Views Left  Result Date: 08/16/2019 CLINICAL DATA:  Status post left fifth digit amputation EXAM: LEFT FOOT - 2 VIEW COMPARISON:  03/21/2019 FINDINGS: Interval amputation of the left fifth digit and transmetatarsal amputation of the left great toe. There is dorsal subluxation of the left second digit at the metatarsophalangeal joint. Diffuse soft tissue edema about the foot. Plate and screw fixation of the medial malleolus and distal fibula. IMPRESSION: 1. Interval amputation of the left fifth digit and transmetatarsal amputation of the left great toe. 2. There is dorsal subluxation of the left second digit at the metatarsophalangeal joint. 3.  Diffuse soft tissue edema about the foot. Electronically Signed   By: Eddie Candle M.D.   On: 08/16/2019 20:35    Blood pressure (!) 105/54, pulse 86, temperature 98.5 F (36.9 C), temperature source Oral, resp. rate 18, height 5' 10.8" (1.798 m), weight 91.3 kg, SpO2 93 %.   Assessment 1. Osteomyelitis left fifth toe status post amputation DOS 08/16/2019 2. Diabetes with polyneuropathy 3. History of PVD status post revascularization  Plan -Examined both feet. -Postop x-ray results reviewed with patient. -Incision site appears to be clean and intact with sutures intact.  Decreased erythema and edema present since admission. -Appreciate medicine recommendations for pain management.  Patient currently taking Tylenol.  Could potentially try a small dose of tramadol. -Recommend transition from IV antibiotics to p.o. antibiotics 7-10d upon discharge.  Follow-up on culture results for appropriate antibiotics.  Appreciate medicine recommendations. -Patient is to be partial weightbearing with heel contact in a surgical shoe to the left foot at all times.  Instructed patient to stay  off the left foot is much as possible and to elevate the extremity. -Patient instructed to keep dressing clean, dry, and intact until postoperative visit #1.  Patient instructed to follow-up in 1 week.  Podiatry team to sign off at this time.  Follow-up instructions placed in chart.  Patient to follow-up in clinic within 1 week of discharge date.  Caroline More 08/17/2019, 3:50 PM

## 2019-08-17 NOTE — Progress Notes (Signed)
[facility] NAME: [name]    MR#:  [id number]  DATE OF BIRTH:  [birth year]  SUBJECTIVE:  status post left fifth toe amputation doing well. Denies any complaints.  REVIEW OF SYSTEMS:    ROS  CONSTITUTIONAL: No documented fever. No fatigue, weakness. No weight gain, no weight loss.  EYES: No blurry or double vision.  ENT: No tinnitus. No postnasal drip. No redness of the oropharynx.  RESPIRATORY: No cough, no wheeze, no hemoptysis. No dyspnea.  CARDIOVASCULAR: No chest pain. No orthopnea. No palpitations. No syncope.  GASTROINTESTINAL: No nausea, no vomiting or diarrhea. No abdominal pain. No melena or hematochezia.  GENITOURINARY: No dysuria or hematuria.  ENDOCRINE: No polyuria or nocturia. No heat or cold intolerance.  HEMATOLOGY: No anemia. No bruising. No bleeding.  INTEGUMENTARY: No rashes. No lesions.  MUSCULOSKELETAL: No arthritis. 5 th toe swelling. No gout.  NEUROLOGIC: No numbness, tingling, or ataxia. No seizure-type activity.  PSYCHIATRIC: No anxiety. No insomnia. No ADD.   DRUG ALLERGIES:   Allergies  Allergen Reactions  . Albumin (Human)     unknown  . Erythromycin Rash  . Midecamycin Rash    VITALS:  Blood pressure (!) 105/54, pulse 86, temperature 98.5 F (36.9 C), temperature source Oral, resp. rate 18, height 5' 10.8" (1.798 m), weight 91.3 kg, SpO2 93 %.  PHYSICAL EXAMINATION:   Physical Exam  GENERAL:  43 y.o.-year-old patient lying in the bed with no acute distress.  EYES: Pupils equal, round, reactive to light and accommodation. No scleral icterus. Extraocular muscles intact.  HEENT: Head atraumatic, normocephalic. Oropharynx and nasopharynx clear.  NECK:  Supple, no jugular venous distention. No thyroid enlargement, no tenderness.  LUNGS: Normal breath sounds bilaterally, no wheezing, rales, rhonchi. No use of accessory muscles of respiration.  CARDIOVASCULAR: S1, S2 normal. No murmurs,  rubs, or gallops.  ABDOMEN: Soft, nontender, nondistended. Bowel sounds present. No organomegaly or mass.  EXTREMITIES: No cyanosis, clubbing  5th toe swelling noted    NEUROLOGIC: Cranial nerves II through XII are intact. No focal Motor or sensory deficits b/l.   PSYCHIATRIC: The patient is alert and oriented x 3.  SKIN: redness and swelling left fifth toe       LABORATORY PANEL:   CBC Recent Labs  Lab 08/17/19 0421  WBC 9.7  HGB 10.8*  HCT 31.8*  PLT 328   ------------------------------------------------------------------------------------------------------------------ Chemistries  Recent Labs  Lab 08/17/19 0421  NA 136  K 3.8  CL 105  CO2 23  GLUCOSE 312*  BUN 26*  CREATININE 1.40*  CALCIUM 8.3*   ------------------------------------------------------------------------------------------------------------------  Cardiac Enzymes No results for input(s): TROPONINI in the last 168 hours. ------------------------------------------------------------------------------------------------------------------  RADIOLOGY:  Dg Foot 2 Views Left  Result Date: 08/16/2019 CLINICAL DATA:  Status post left fifth digit amputation EXAM: LEFT FOOT - 2 VIEW COMPARISON:  03/21/2019 FINDINGS: Interval amputation of the left fifth digit and transmetatarsal amputation of the left great toe. There is dorsal subluxation of the left second digit at the metatarsophalangeal joint. Diffuse soft tissue edema about the foot. Plate and screw fixation of the medial malleolus and distal fibula. IMPRESSION: 1. Interval amputation of the left fifth digit and transmetatarsal amputation of the left great toe. 2. There is dorsal subluxation of the left second digit at the metatarsophalangeal joint. 3.  Diffuse soft tissue edema about the foot. Electronically Signed   By: Eddie Candle M.D.   On: 08/16/2019 20:35     ASSESSMENT AND  PLAN:   43 y.o. 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 Luana Shu appreciated -start physical therapy  2. Diabetes mellitus with neuropathy -resume glipizide and DC Metformin due to elevated creatinine -Continue Lantus insulin and sliding scale coverage with insulin -Diabetic diet   3. Acute kidney injury on Chronic CKD stage III-creatinine slightly elevated above baseline -Hold nephrotoxins -Continue to monitor renal function -DC metformin  4. Hyperlipidemia - Continue atorvastatin 20 mg  5. Hypertension - Continue metoprolol  6. GERD -Protonix  7. Depression and anxiety - Continue Remeron, amitriptyline, Effexor and quetiapine  8. COPD -no evidence of exacerbation  9. DVT prophylaxis -hold anticoagulation for surgery   Physical therapy to see patient. Management for discharge planning. Anticipated discharge tomorrow  All the records are reviewed and case discussed with Care Management/Social Worker. Management plans discussed with the patient and they are in agreement.  CODE STATUS: Full code  DVT Prophylaxis: Lovenox  TOTAL TIME TAKING CARE OF THIS PATIENT: 38 minutes.   POSSIBLE D/C IN 2 to 3 DAYS, DEPENDING ON CLINICAL CONDITION.  Fritzi Mandes M.D on 08/17/2019 at 8:01 AM  Between 7am to 6pm - Pager - [phone]  After 6pm go to [email or web address] - password [email or web address] [facility]   Office  [phone]  CC: Primary care physician; Lacie Draft, NP  Note: This dictation was prepared with Dragon dictation along with smaller phrase technology. Any transcriptional errors that result from this process are unintentional.

## 2019-08-18 LAB — GLUCOSE, CAPILLARY: Glucose-Capillary: 221 mg/dL — ABNORMAL HIGH (ref 70–99)

## 2019-08-18 MED ORDER — CEFDINIR 300 MG PO CAPS
300.0000 mg | ORAL_CAPSULE | Freq: Two times a day (BID) | ORAL | Status: DC
  Administered 2019-08-18: 300 mg via ORAL
  Filled 2019-08-18 (×2): qty 1

## 2019-08-18 MED ORDER — CEFDINIR 300 MG PO CAPS: 300.0000 mg | ORAL_CAPSULE | Freq: Two times a day (BID) | ORAL | 0 refills | Status: DC

## 2019-08-18 NOTE — Plan of Care (Signed)
Patient satisfied with pain management and POC. Plan to discharge to home today.

## 2019-08-18 NOTE — TOC Initial Note (Addendum)
Transition of Care [facility]) - Initial/Assessment Note    Patient Details  Name: [name] MRN: [id number] Date of Birth: 07/02/1976  Transition of [facility]) CM/SW Contact:    Elliot Gurney [place], [place] Phone Number: 08/18/2019, 10:09 AM  Clinical Narrative:                 Patient is 43 y.o.femalewith pertinent past medical history of COPD, DM, hypertension, cocaine abuse, Heroine overdose, seizure disorder, recent staphaureusbacteremia, and left great toe infection s/p amputation. Patient discharging home today. Per patient, she currently lives with her Aunt. Patient states that she has a walker, hoyer lift and a chair lift in the home. Patient will have to walk up 4 stairs to get inside the home. Phone call made to [facility] to request [facility] services. However if not accepted, confirms history of receiving OP Rehab at [facility]. Patient provided with the contact information to the outpatient department at [facility] [phone] to call on Monday, 08/19/19 to schedule OP Rehab if [facility] request through [facility] is not accepted. Per patient, her aunt will provide transportation to her medical appointments. Expected Discharge Plan: OP Rehab     Patient Goals and CMS Choice        Expected Discharge Plan and Services Expected Discharge Plan: OP Rehab In-house Referral: Clinical Social Work Discharge Planning Services: CM Consult   Living arrangements for the past 2 months: Single Family Home Expected Discharge Date: 08/18/19               DME Arranged: N/A                    Prior Living Arrangements/Services Living arrangements for the past 2 months: Single Family Home Lives with:: Relatives Patient language and need for interpreter reviewed:: No Do you feel safe going back to the place where you live?: Yes      Need for Family Participation in Patient Care: No (Comment) Care giver support system in place?: Yes (comment) Current home  services: DME(walker, chair and hoyer lift) Criminal Activity/Legal Involvement Pertinent to Current Situation/Hospitalization: No - Comment as needed  Activities of Daily Living Home Assistive Devices/Equipment: Walker (specify type), Eyeglasses ADL Screening (condition at time of admission) Patient's cognitive ability adequate to safely complete daily activities?: Yes Is the patient deaf or have difficulty hearing?: No Does the patient have difficulty seeing, even when wearing glasses/contacts?: No Does the patient have difficulty concentrating, remembering, or making decisions?: Yes Patient able to express need for assistance with ADLs?: Yes Does the patient have difficulty dressing or bathing?: No Independently performs ADLs?: Yes (appropriate for developmental age) Does the patient have difficulty walking or climbing stairs?: Yes Weakness of Legs: Both Weakness of Arms/Hands: None  Permission Sought/Granted   Permission granted to share information with : Yes, Release of Information Signed  Share Information with NAME: Susie Isom     Permission granted to share info w Relationship: Aunt  Permission granted to share info w Contact Information: [phone]  Emotional Assessment Appearance:: Appears older than stated age Attitude/Demeanor/Rapport: Engaged Affect (typically observed): Accepting, Adaptable Orientation: : Oriented to Self, Oriented to Place, Oriented to  Time, Oriented to Situation Alcohol / Substance Use: Not Applicable Psych Involvement: No (comment)  Admission diagnosis:  Osteomyelitis Patient Active Problem List   Diagnosis Date Noted  . Osteomyelitis of fifth toe of left foot ([place]) 08/15/2019  . Bacteremia   . HTN (hypertension) 03/21/2019  . Chronic combined systolic and  diastolic CHF (congestive heart failure) ([place]) 03/21/2019  . Toe osteomyelitis, left ([place]) 03/21/2019  . Sepsis ([place]) 03/21/2019  . Atherosclerosis of native arteries of the  extremities with ulceration ([place]) 03/04/2019  . Ulcer of great toe, left, with fat layer exposed ([place]) 09/28/2018  . Menometrorrhagia 06/29/2018  . 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 Secondary localized osteoarthrosis of ankle and foot 01/10/2018  . Chronic foot pain, left 02/20/2017  . Leg edema, left 02/20/2017  . 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 Urinary retention with incomplete bladder emptying 05/02/2014  . 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 Cocaine abuse ([place]) 04/23/2014  . Bipolar disorder, unspecified ([place]) 04/23/2014  . Morbid obesity ([place]) 04/23/2014  . DM type 2 (diabetes mellitus, type 2)- poorly controlled 04/23/2014  . Toxic encephalopathy 04/20/2014  . 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 Lactic acid acidosis 04/20/2014  . Paraparesis of both lower limbs ([place]) 04/20/2014  . HCAP (healthcare-associated pneumonia) 04/20/2014  . Post-traumatic stress disorder, unspecified 03/20/2014  . Severe cannabis use disorder ([place]) 03/20/2014   PCP:  Lacie Draft, NP Pharmacy:   [facility] [address] [place] [address] Phone: [phone] Fax: [place], [place] - [place] [address] Phone: [phone] Fax: [place], [place] - [address] [place] [address] Phone: [phone] Fax: [phone]  [place], [place] - [place] [address] Phone: [phone] Fax: [phone]     Social Determinants of Health (SDOH) Interventions    Readmission Risk Interventions No flowsheet data found.

## 2019-08-18 NOTE — Discharge Summary (Signed)
[employer] [facility] NAME: [name]    MR#:  [id number]  DATE OF BIRTH:  1975-11-18  DATE OF ADMISSION:  08/15/2019 ADMITTING PHYSICIAN: Saundra Shelling, MD  DATE OF DISCHARGE: 08/18/2019  PRIMARY CARE PHYSICIAN: Lacie Draft, NP    ADMISSION DIAGNOSIS:  Osteomyelitis  DISCHARGE DIAGNOSIS:  left fifth toe osteomyelitis status post amputation  SECONDARY DIAGNOSIS:   Past Medical History:  Diagnosis Date  . Chronic combined systolic and diastolic CHF (congestive heart failure) ([place])    a. ECHO 04/23/14 LV EF: 45% - 50%,  mild LVH, diffuse hypokinesis. LV diastolic dysfunction, high ventricular filling pressure. Mild LA dilation.  . Cocaine abuse ([place])   . COPD (chronic obstructive pulmonary disease) ([place])   . Diabetes mellitus without complication ([place])   . Heroin overdose ([place]) 10/2018  . Hypertension   . Nicotine dependence with current use   . 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 Luana Shu appreciated--pt to f/u in 1 week -start physical therapy--HHPT. She has RW  2.Diabetes mellitus with neuropathy -resume glipizide and DC Metformin due to elevated creatinine -Continue Lantus insulin and sliding scale coverage with  insulin -Diabetic diet   3. Acute kidney injury on Chronic CKD stage III-creatinine slightly elevated above baseline -Hold nephrotoxins -Continue to monitor renal function -DC metformin  4.Hyperlipidemia -Continue atorvastatin 20 mg  5.Hypertension -Continue metoprolol  6.GERD-Protonix  7.Depression and anxiety -Continue Remeron, amitriptyline, Effexor and quetiapine  8.COPD-no evidence of exacerbation  9.DVT prophylaxis-hold anticoagulation for surgery    Management for discharge planning. Anticipated discharge today. Pt agrees with plan HhPT/RN  CONSULTS OBTAINED:  Treatment Team:  Caroline More, DPM  DRUG ALLERGIES:   Allergies  Allergen Reactions  . Albumin (Human)     unknown  . Erythromycin Rash  . Midecamycin Rash    DISCHARGE MEDICATIONS:   Allergies as of 08/18/2019      Reactions   Albumin (human)    unknown   Erythromycin Rash   Midecamycin Rash      Medication List    STOP taking these medications   metFORMIN 1000 MG tablet Commonly known as: GLUCOPHAGE     TAKE these medications   amitriptyline 150 MG tablet Commonly known as: ELAVIL Take 150 mg by mouth at bedtime.   aspirin EC 81 MG tablet Take 81 mg by mouth daily.   atorvastatin 20 MG tablet Commonly known as: LIPITOR Take 20 mg by mouth at bedtime.   bethanechol 25 MG tablet Commonly known as: URECHOLINE Take 50 mg by mouth 3 (three) times daily.   cefdinir 300 MG capsule Commonly known as: OMNICEF Take 1 capsule (300 mg total) by mouth every 12 (twelve) hours.   furosemide 20 MG tablet Commonly known as: LASIX Take 20 mg by mouth 2 (two) times a day.   glipiZIDE 10 MG tablet  Commonly known as: GLUCOTROL Take 1 tablet by mouth every morning.   hydrOXYzine 50 MG tablet Commonly known as: ATARAX/VISTARIL Take 50 mg by mouth 2 (two) times a day.   insulin aspart 100 UNIT/ML injection Commonly known as: novoLOG Inject 4-14 Units into the  skin 3 (three) times daily as needed for high blood sugar.   insulin glargine 100 UNIT/ML injection Commonly known as: LANTUS Inject 40 Units into the skin 2 (two) times a day.   metoprolol tartrate 50 MG tablet Commonly known as: LOPRESSOR Take 25 mg by mouth at bedtime.   mirtazapine 15 MG tablet Commonly known as: REMERON Take 7.5-15 mg by mouth at bedtime.   pantoprazole 20 MG tablet Commonly known as: PROTONIX Take 20 mg by mouth daily.   prazosin 5 MG capsule Commonly known as: MINIPRESS Take 5 mg by mouth at bedtime.   promethazine 12.5 MG tablet Commonly known as: PHENERGAN Take 12.5 mg by mouth 2 (two) times daily as needed for nausea or vomiting.   QUEtiapine 400 MG tablet Commonly known as: SEROQUEL Take 800 mg by mouth at bedtime.   tamsulosin 0.4 MG Caps capsule Commonly known as: FLOMAX TAKE 1 CAPSULE PO BID 30 MINUTES AFTER A MEAL   venlafaxine XR 150 MG 24 hr capsule Commonly known as: EFFEXOR-XR Take 300 mg by mouth daily.       If you experience worsening of your admission symptoms, develop shortness of breath, life threatening emergency, suicidal or homicidal thoughts you must seek medical attention immediately by calling 911 or calling your MD immediately  if symptoms less severe.  You Must read complete instructions/literature along with all the possible adverse reactions/side effects for all the Medicines you take and that have been prescribed to you. Take any new Medicines after you have completely understood and accept all the possible adverse reactions/side effects.   Please note  You were cared for by a hospitalist during your hospital stay. If you have any questions about your discharge medications or the care you received while you were in the hospital after you are discharged, you can call the unit and asked to speak with the hospitalist on call if the hospitalist that took care of you is not available. Once you are discharged, your primary  care physician will handle any further medical issues. Please note that NO REFILLS for any discharge medications will be authorized once you are discharged, as it is imperative that you return to your primary care physician (or establish a relationship with a primary care physician if you do not have one) for your aftercare needs so that they can reassess your need for medications and monitor your lab values. Today   SUBJECTIVE  No new complaints   VITAL SIGNS:  Blood pressure (!) 125/96, pulse (!) 101, temperature 98.1 F (36.7 C), temperature source Oral, resp. rate 19, height 5' 10.8" (1.798 m), weight 91.3 kg, SpO2 99 %.  I/O:    Intake/Output Summary (Last 24 hours) at 08/18/2019 0916 Last data filed at 08/17/2019 1005 Gross per 24 hour  Intake 180 ml  Output -  Net 180 ml    PHYSICAL EXAMINATION:  GENERAL:  43 y.o.-year-old patient lying in the bed with no acute distress.  EYES: Pupils equal, round, reactive to light and accommodation. No scleral icterus. Extraocular muscles intact.  HEENT: Head atraumatic, normocephalic. Oropharynx and nasopharynx clear.  NECK:  Supple, no jugular venous distention. No thyroid enlargement, no tenderness.  LUNGS: Normal breath sounds bilaterally, no  wheezing, rales,rhonchi or crepitation. No use of accessory muscles of respiration.  CARDIOVASCULAR: S1, S2 normal. No murmurs, rubs, or gallops.  ABDOMEN: Soft, non-tender, non-distended. Bowel sounds present. No organomegaly or mass.  EXTREMITIES: No pedal edema, cyanosis, or clubbing.    NEUROLOGIC: Cranial nerves II through XII are intact. Muscle strength 5/5 in all extremities.neuropathy+ Gait not checked.  PSYCHIATRIC: The patient is alert and oriented x 3.  SKIN: No obvious rash, lesion, or ulcer.   DATA REVIEW:   CBC  Recent Labs  Lab 08/17/19 0421  WBC 9.7  HGB 10.8*  HCT 31.8*  PLT 328    Chemistries  Recent Labs  Lab 08/17/19 0421  NA 136  K 3.8  CL 105  CO2 23   GLUCOSE 312*  BUN 26*  CREATININE 1.40*  CALCIUM 8.3*    Microbiology Results   Recent Results (from the past 240 hour(s))  Anaerobic culture     Status: None (Preliminary result)   Collection Time: 08/15/19  6:02 PM   Specimen: Wound  Result Value Ref Range Status   Specimen Description WOUND TOE  Final   Special Requests LEFT FIFTH  Final   Culture   Final    HOLDING FOR POSSIBLE ANAEROBE Performed at [facility], [address], [place], [place] [address]    Report Status PENDING  Incomplete  Aerobic Culture (superficial specimen)     Status: None (Preliminary result)   Collection Time: 08/15/19  6:02 PM   Specimen: Wound  Result Value Ref Range Status   Specimen Description   Final    WOUND TOE Performed at [facility], [address], [place], [place] [address]    Special Requests   Final    LEFT FIFTH Performed at [facility], [address] [place], [place] [address]    Gram Stain   Final    NO WBC SEEN NO ORGANISMS SEEN Performed at [facility], [place] [address] [place], [place] [address]    Culture ABUNDANT STAPHYLOCOCCUS AUREUS  Final   Report Status PENDING  Incomplete  CULTURE, BLOOD (ROUTINE X 2) w Reflex to ID Panel     Status: None (Preliminary result)   Collection Time: 08/15/19  7:51 PM   Specimen: BLOOD  Result Value Ref Range Status   Specimen Description BLOOD BLOOD LEFT FOREARM  Final   Special Requests   Final    BOTTLES DRAWN AEROBIC AND ANAEROBIC Blood Culture results may not be optimal due to an inadequate volume of blood received in culture bottles   Culture   Final    NO GROWTH 3 DAYS Performed at [facility], [address] [place], [place] [address]    Report Status PENDING  Incomplete  CULTURE, BLOOD (ROUTINE X 2) w Reflex to ID Panel     Status: None (Preliminary result)   Collection Time: 08/15/19  7:51 PM   Specimen: BLOOD  Result Value Ref Range Status   Specimen Description BLOOD  RIGHT ANTECUBITAL  Final   Special Requests   Final    BOTTLES DRAWN AEROBIC ONLY Blood Culture results may not be optimal due to an inadequate volume of blood received in culture bottles   Culture   Final    NO GROWTH 3 DAYS Performed at [facility], [address] [place], [place] [address]    Report Status PENDING  Incomplete  Surgical PCR screen     Status: Abnormal   Collection Time:  08/16/19 12:04 PM   Specimen: Nasopharyngeal Swab; Nasal Swab  Result Value Ref Range Status   MRSA, PCR NEGATIVE NEGATIVE Final   Staphylococcus aureus POSITIVE (A) NEGATIVE Final    Comment: (NOTE) The Xpert SA Assay (FDA approved for NASAL specimens in patients 8 years of age and older), is one component of a comprehensive surveillance program. It is not intended to diagnose infection nor to guide or monitor treatment. Performed at [facility], [address] [place], [place] [address]   SARS Coronavirus 2 by RT PCR (hospital order, performed in [facility] hospital lab) Nasopharyngeal Nasopharyngeal Swab     Status: None   Collection Time: 08/16/19 12:04 PM   Specimen: Nasopharyngeal Swab  Result Value Ref Range Status   SARS Coronavirus 2 NEGATIVE NEGATIVE Final    Comment: (NOTE) If result is NEGATIVE SARS-CoV-2 target nucleic acids are NOT DETECTED. The SARS-CoV-2 RNA is generally detectable in upper and lower  respiratory specimens during the acute phase of infection. The lowest  concentration of SARS-CoV-2 viral copies this assay can detect is 250  copies / mL. A negative result does not preclude SARS-CoV-2 infection  and should not be used as the sole basis for treatment or other  patient management decisions.  A negative result may occur with  improper specimen collection / handling, submission of specimen other  than nasopharyngeal swab, presence of viral mutation(s) within the  areas targeted by this assay, and inadequate number of viral copies  (<250  copies / mL). A negative result must be combined with clinical  observations, patient history, and epidemiological information. If result is POSITIVE SARS-CoV-2 target nucleic acids are DETECTED. The SARS-CoV-2 RNA is generally detectable in upper and lower  respiratory specimens dur ing the acute phase of infection.  Positive  results are indicative of active infection with SARS-CoV-2.  Clinical  correlation with patient history and other diagnostic information is  necessary to determine patient infection status.  Positive results do  not rule out bacterial infection or co-infection with other viruses. If result is PRESUMPTIVE POSTIVE SARS-CoV-2 nucleic acids MAY BE PRESENT.   A presumptive positive result was obtained on the submitted specimen  and confirmed on repeat testing.  While 2019 novel coronavirus  (SARS-CoV-2) nucleic acids may be present in the submitted sample  additional confirmatory testing may be necessary for epidemiological  and / or clinical management purposes  to differentiate between  SARS-CoV-2 and other Sarbecovirus currently known to infect humans.  If clinically indicated additional testing with an alternate test  methodology [phone]) is advised. The SARS-CoV-2 RNA is generally  detectable in upper and lower respiratory sp ecimens during the acute  phase of infection. The expected result is Negative. Fact Sheet for Patients:  StrictlyIdeas.no Fact Sheet for Healthcare Providers: BankingDealers.co.za This test is not yet approved or cleared by the [place] FDA and has been authorized for detection and/or diagnosis of SARS-CoV-2 by FDA under an Emergency Use Authorization (EUA).  This EUA will remain in effect (meaning this test can be used) for the duration of the COVID-19 declaration under Section 564(b)(1) of the Act, 21 U.S.C. section 360bbb-3(b)(1), unless the authorization is terminated or revoked  sooner. Performed at [facility], [address] [place], [place] [address]   Aerobic/Anaerobic Culture (surgical/deep wound)     Status: None (Preliminary result)   Collection Time: 08/16/19  5:37 PM   Specimen: PATH Digit amputation; Tissue  Result Value Ref Range Status   Specimen Description  Final    TISSUE LEFT FIFTH TOE Performed at [facility], [place] [address], [place], [place] [address]    Special Requests   Final    NONE Performed at [facility], [place], [place] [address]    Gram Stain   Final    FEW WBC PRESENT,BOTH PMN AND MONONUCLEAR FEW GRAM POSITIVE COCCI FEW GRAM NEGATIVE RODS    Culture   Final    TOO YOUNG TO READ Performed at [facility], [place] [address], [place], [place] [address]    Report Status PENDING  Incomplete    RADIOLOGY:  Dg Foot 2 Views Left  Result Date: 08/16/2019 CLINICAL DATA:  Status post left fifth digit amputation EXAM: LEFT FOOT - 2 VIEW COMPARISON:  03/21/2019 FINDINGS: Interval amputation of the left fifth digit and transmetatarsal amputation of the left great toe. There is dorsal subluxation of the left second digit at the metatarsophalangeal joint. Diffuse soft tissue edema about the foot. Plate and screw fixation of the medial malleolus and distal fibula. IMPRESSION: 1. Interval amputation of the left fifth digit and transmetatarsal amputation of the left great toe. 2. There is dorsal subluxation of the left second digit at the metatarsophalangeal joint. 3.  Diffuse soft tissue edema about the foot. Electronically Signed   By: Eddie Candle M.D.   On: 08/16/2019 20:35     CODE STATUS:     Code Status Orders  (From admission, onward)         Start     Ordered   08/15/19 1941  Full code  Continuous     08/15/19 1941        Code Status History    Date Active Date Inactive Code Status Order ID Comments User Context   03/21/2019 2224 03/28/2019 1915 Full Code [id number]  Lance Coon, MD Inpatient   03/12/2019 1026 03/12/2019 1623 Full Code [id number]  Katha Cabal, MD Inpatient   04/28/2014 1601 05/14/2014 1403 Full Code [id number]  Flora Lipps Inpatient   04/20/2014 1544 04/28/2014 1601 Full Code [id number]  Donita Brooks, NP ED   Advance Care Planning Activity      TOTAL TIME TAKING CARE OF THIS PATIENT: *40* minutes.    Fritzi Mandes M.D on 08/18/2019 at 9:16 AM  Between 7am to 6pm - Pager - [phone] After 6pm go to [email or web address] - password [email or web address] [facility]  Office  [phone]  CC: Primary care physician; Lacie Draft, NP

## 2019-08-18 NOTE — TOC Progression Note (Signed)
Transition of Care [facility]) - Progression Note    Patient Details  Name: DONDRA SHIVERDECKER MRN: [id number] Date of Birth: May 02, 1976  [facility] CM/SW Contact  Roy Tokarz, [place], Day Phone Number: 08/18/2019, 2:17 PM  Clinical Narrative:    Phone call received from [facility] with [place] [facility] stating  that patient has been accepted for [facility] PT.  Phone call made to patient informing her that [place] care will be contacting her to set up the initial assessment for [facility] PT services.   Hertha Gergen, LCSW Clinical Social Work [phone]    Expected Discharge Plan: OP Rehab    Expected Discharge Plan and Services Expected Discharge Plan: OP Rehab In-house Referral: Clinical Social Work Discharge Planning Services: CM Consult   Living arrangements for the past 2 months: [place] Expected Discharge Date: 08/18/19               DME Arranged: N/A                     Social Determinants of Health (SDOH) Interventions    Readmission Risk Interventions No flowsheet data found.

## 2019-08-18 NOTE — Progress Notes (Signed)
Physical Therapy Treatment Patient Details Name: [name] MRN: [id number] DOB: Oct 12, 1976 Today's Date: 08/18/2019    History of Present Illness 43 yo female with onset of amputation to 5th toe on L foot due to osteomyelitis after L great toe amp in May 2020.  Has PMHx:  CHF, cocaine abuse, COPD, heroin overdose, nicotine dependence, seizures, DM, TEE, LE angiography, polyneuropathy, PVD, revascularization for LLE,     PT Comments    Pt was seen for mobility and balance with stairs and RW.  Has good transfer technique for BR commode and with WC, and is motivated by pending discharge.  Follow as needed if dc plans change, but otherwise will be seen by home therapy for follow up for strengthening and safety in gait.   Follow Up Recommendations  Home health PT;Supervision for mobility/OOB     Equipment Recommendations  Rolling walker with 5" wheels    Recommendations for Other Services       Precautions / Restrictions Precautions Precautions: Fall Precaution Comments: pt has oscillating L hip tone, athetoid quality movement of UE Required Braces or Orthoses: Other Brace Other Brace: L foot cast shoe Restrictions Weight Bearing Restrictions: Yes LLE Weight Bearing: Partial weight bearing LLE Partial Weight Bearing Percentage or Pounds: PWB with cast shoe on L foot, through heel only    Mobility  Bed Mobility Overal bed mobility: Modified Independent                Transfers Overall transfer level: Modified independent   Transfers: Sit to/from [employer] Transfers              Ambulation/Gait Ambulation/Gait assistance: Min guard Gait Distance (Feet): 35 Feet Assistive device: Rolling walker (2 wheeled);1 person hand held assist Gait Pattern/deviations: Step-through pattern;Wide base of support;Shuffle;Decreased stride length Gait velocity: reduced Gait velocity interpretation: <1.31 ft/sec, indicative of household ambulator General Gait  Details: pt is more aware of L LE limitations   Stairs Stairs: Yes Stairs assistance: Min guard Stair Management: Two rails;Forwards;Step to pattern Number of Stairs: 4 General stair comments: was able to get up and down but  needs to use RLE as stronger leg coming down, talked about managing this   Information systems manager mobility: Yes Wheelchair propulsion: Both upper extremities Wheelchair parts: Supervision/cueing  Modified Rankin (Stroke Patients Only)       Balance Overall balance assessment: History of Falls;Needs assistance Sitting-balance support: Feet supported Sitting balance-Leahy Scale: Good       Standing balance-Leahy Scale: Fair                              Cognition Arousal/Alertness: Awake/alert Behavior During Therapy: WFL for tasks assessed/performed Overall Cognitive Status: Within Functional Limits for tasks assessed                                 General Comments: pt is very on point about LE limitations for gait      Exercises      General Comments General comments (skin integrity, edema, etc.): reviewed technique with stairs, good response      Pertinent Vitals/Pain Pain Assessment: Faces Faces Pain Scale: Hurts little more Pain Location: back Pain Descriptors / Indicators: Sore Pain Intervention(s): Monitored during session;Repositioned;Premedicated before session    Home Living  Prior Function            PT Goals (current goals can now be found in the care plan section) Acute Rehab PT Goals Patient Stated Goal: get home Progress towards PT goals: Progressing toward goals    Frequency    7X/week      PT Plan Current plan remains appropriate    Co-evaluation              AM-PAC PT "6 Clicks" Mobility   Outcome Measure  Help needed turning from your back to your side while in a flat bed without using bedrails?: None Help  needed moving from lying on your back to sitting on the side of a flat bed without using bedrails?: None Help needed moving to and from a bed to a chair (including a wheelchair)?: None Help needed standing up from a chair using your arms (e.g., wheelchair or bedside chair)?: None Help needed to walk in hospital room?: A Little Help needed climbing 3-5 steps with a railing? : A Little 6 Click Score: 22    End of Session Equipment Utilized During Treatment: Gait belt Activity Tolerance: Patient limited by fatigue;Treatment limited secondary to medical complications (Comment) Patient left: in bed;with call bell/phone within reach;with family/visitor present Nurse Communication: Mobility status PT Visit Diagnosis: Unsteadiness on feet (R26.81);Muscle weakness (generalized) (M62.81);Pain Pain - Right/Left: (spine) Pain - part of body: (spine)     Time: [id number] PT Time Calculation (min) (ACUTE ONLY): 24 min  Charges:  $Gait Training: 23-37 mins               Ramond Dial 08/18/2019, 10:52 AM   Mee Hives, PT MS Acute Rehab Dept. Number: [place] and [place]

## 2019-08-19 LAB — AEROBIC CULTURE W GRAM STAIN (SUPERFICIAL SPECIMEN): Gram Stain: NONE SEEN

## 2019-08-20 LAB — AEROBIC/ANAEROBIC CULTURE W GRAM STAIN (SURGICAL/DEEP WOUND)

## 2019-08-20 LAB — CULTURE, BLOOD (ROUTINE X 2)
Culture: NO GROWTH
Culture: NO GROWTH

## 2019-08-21 LAB — SURGICAL PATHOLOGY

## 2019-08-22 LAB — ANAEROBIC CULTURE

## 2019-10-25 DIAGNOSIS — I509 Heart failure, unspecified: Secondary | ICD-10-CM | POA: Insufficient documentation

## 2019-11-03 IMAGING — MR MRI OF THE LEFT FOOT WITHOUT CONTRAST
7 series · 40 of 40 positions shown · non-contrast
Comparison: None.

CLINICAL DATA: Foot swelling with wound involving the left great
toe.

EXAM:
MRI OF THE LEFT FOOT WITHOUT CONTRAST
TECHNIQUE: Multiplanar, multisequence MR imaging of the bilateral was
performed. No intravenous contrast was administered.

[Series 4: T1 · coronal · left · 3.0mm · 0.38mm/px · 9 of 45 slices shown (1 of 2)]
[im 1/45]
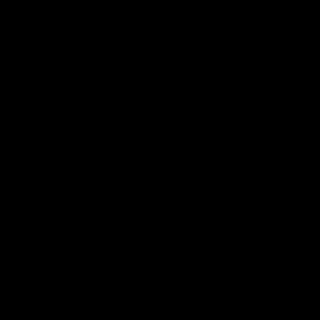
[im 6/45]
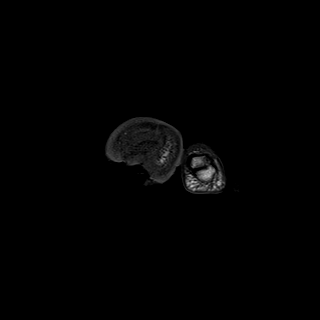
[im 12/45]
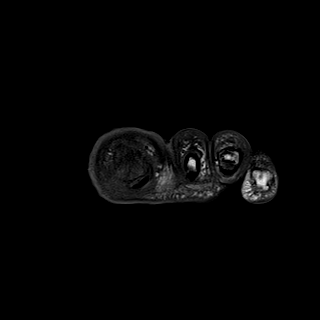
[im 17/45]
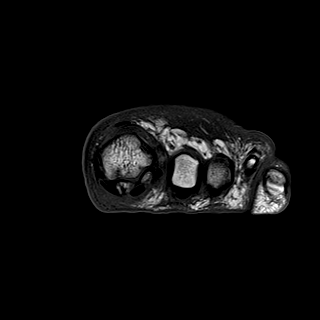
[im 23/45]
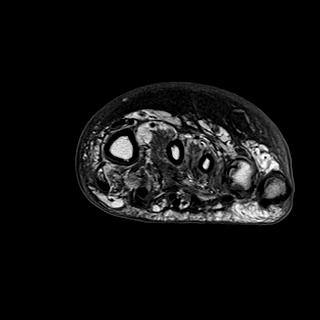
[im 28/45]
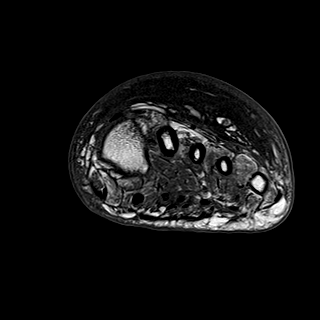
[im 34/45]
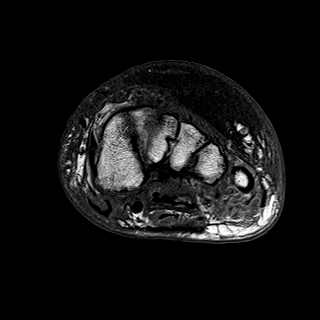
[im 39/45]
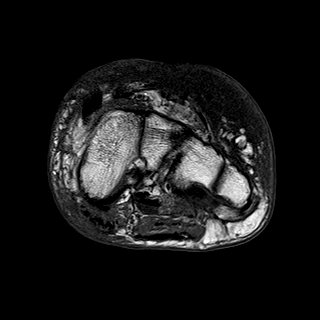
[im 45/45]
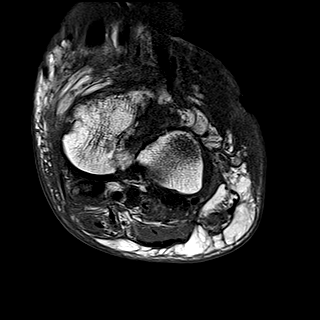

[Series 5: T2 · coronal · left · 3.0mm · 0.50mm/px · 9 of 45 slices shown (1 of 4)]
[im 1/45]
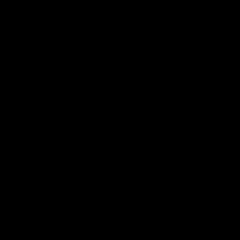
[im 6/45]
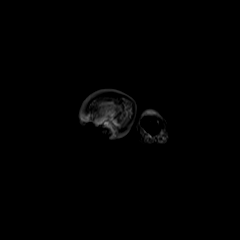
[im 12/45]
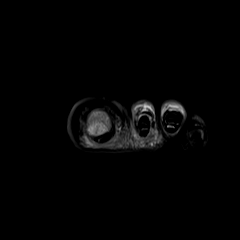
[im 17/45]
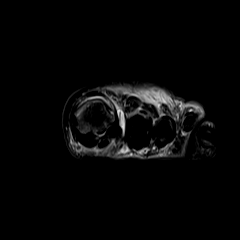
[im 23/45]
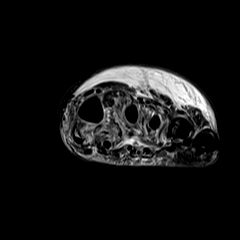
[im 28/45]
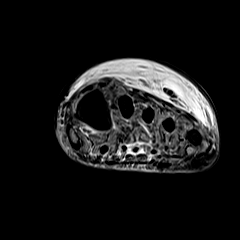
[im 34/45]
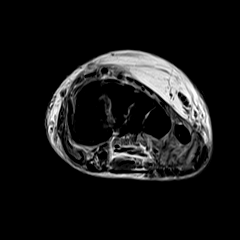
[im 39/45]
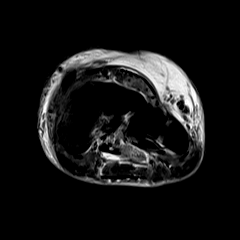
[im 45/45]
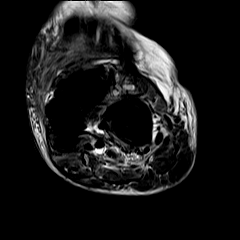

[Series 6: T2 · coronal · left · 3.0mm · 0.50mm/px · 8 of 45 slices shown (2 of 4)]
[im 1/45]
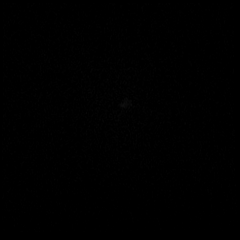
[im 7/45]
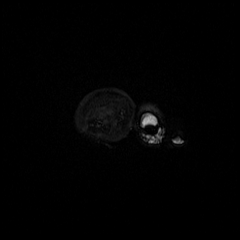
[im 13/45]
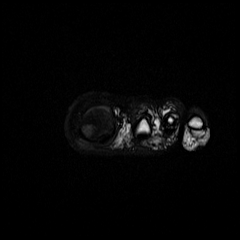
[im 19/45]
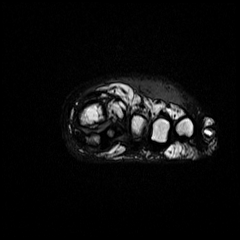
[im 26/45]
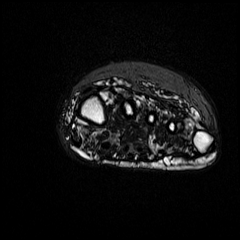
[im 32/45]
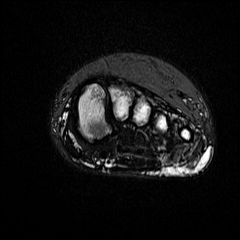
[im 38/45]
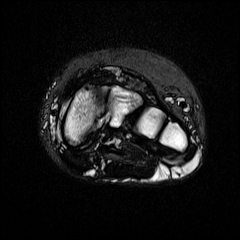
[im 45/45]
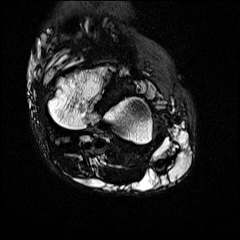

[Series 7: T1 · axial · left · 3.0mm · 0.70mm/px · z∈[-66,-4]mm · 3 of 17 slices shown (2 of 2)]
[im 1/17]
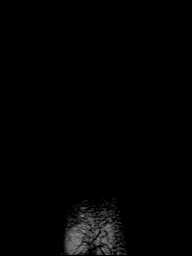
[im 9/17]
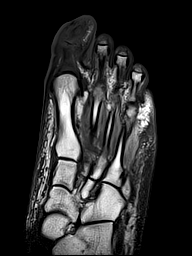
[im 17/17]
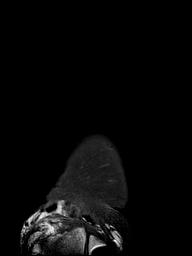

[Series 8: T2 · axial · left · 3.0mm · 0.70mm/px · z∈[-66,-4]mm · 3 of 17 slices shown (3 of 4)]
[im 1/17]
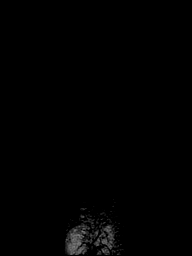
[im 9/17]
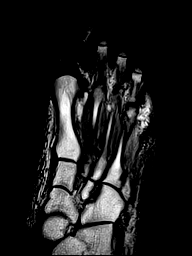
[im 17/17]
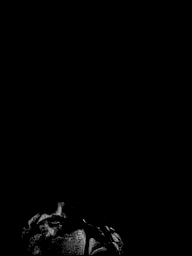

[Series 9: T2 · axial · left · 3.0mm · 0.70mm/px · z∈[-66,-4]mm · 3 of 17 slices shown (4 of 4)]
[im 1/17]
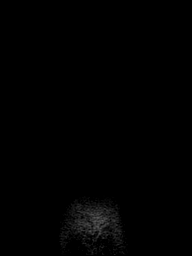
[im 9/17]
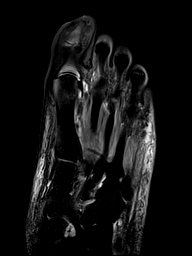
[im 17/17]
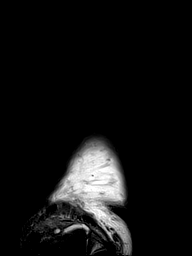

[Series 10: STIR · sagittal · left · 3.0mm · 0.62mm/px · 5 of 27 slices shown]
[im 1/27]
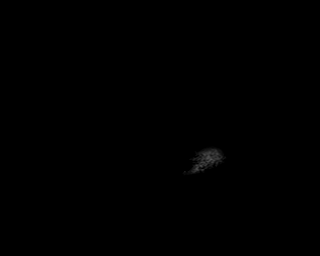
[im 7/27]
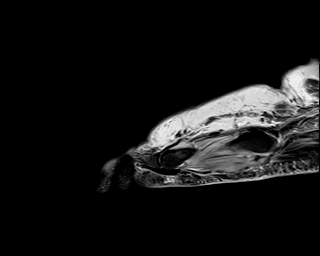
[im 14/27]
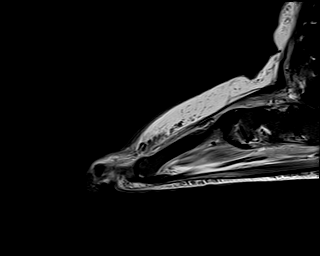
[im 20/27]
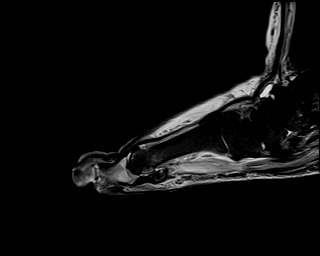
[im 27/27]
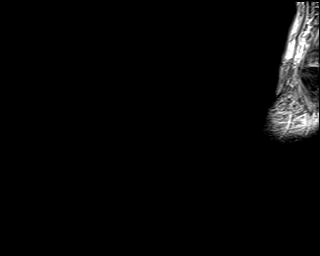

[40 of 40 positions shown; findings below may reference images not displayed]

FINDINGS: Bones/Joint/Cartilage

Soft tissue wound involving the plantar aspect of great toe.
Cortical destruction along the plantar aspect of the first distal
phalanx and head of the first proximal phalanx with severe T1
hypointensity throughout the first proximal and distal phalanx and
associated marrow edema most consistent with osteomyelitis.

Mild marrow edema in the first metatarsal head along the medial
aspect without cortical destruction. Small first MTP joint effusion.

No other marrow signal abnormality. Mild degenerative changes of the
medial hallux sesamoid-metatarsal head joint. No fracture or
dislocation. Normal alignment. Mild osteoarthritis of the
navicular-medial cuneiform. Moderate osteoarthritis of the
talonavicular joint with a small joint effusion.

Ligaments

Collateral ligaments are intact.  Lisfranc ligament is intact.

Muscles and Tendons
Flexor, peroneal and extensor compartment tendons are intact. T2
hyperintense signal throughout the plantar musculature likely
neurogenic.

Soft tissue
No fluid collection or hematoma. No soft tissue mass. Severe soft
tissue edema along the dorsal aspect of the foot and ankle.
IMPRESSION: 1. Soft tissue ulcer along the plantar aspect of the great toe.
Severe osteomyelitis throughout the first proximal and first distal
phalanx.
2. Mild marrow edema in the first metatarsal head along the medial
aspect without cortical destruction. Small first MTP joint effusion.
These may reflect reactive changes secondary to adjacent
inflammation versus early septic arthritis.

## 2019-11-07 DIAGNOSIS — I342 Nonrheumatic mitral (valve) stenosis: Secondary | ICD-10-CM | POA: Insufficient documentation

## 2019-11-12 NOTE — Telephone Encounter (Signed)
Was taken care of.

## 2020-01-29 ENCOUNTER — Other Ambulatory Visit: Payer: Self-pay | Admitting: Podiatry

## 2020-01-30 ENCOUNTER — Other Ambulatory Visit: Payer: Self-pay

## 2020-01-30 ENCOUNTER — Encounter
Admission: RE | Admit: 2020-01-30 | Discharge: 2020-01-30 | Disposition: A | Discharge status: Home / Self Care | Payer: Medicaid Other | Source: Ambulatory Visit | Attending: Podiatry | Admitting: Podiatry

## 2020-01-30 HISTORY — DX: Methicillin resistant Staphylococcus aureus infection, unspecified site: A49.02

## 2020-01-30 HISTORY — DX: Gastro-esophageal reflux disease without esophagitis: K21.9

## 2020-01-30 HISTORY — DX: Bipolar disorder, unspecified: F31.9

## 2020-01-30 NOTE — Patient Instructions (Addendum)
Your procedure is scheduled on: Wednesday, April 14 Report to Day Surgery on the 2nd floor of the [facility]. To find out your arrival time, please call [phone] between 1PM - 3PM on: Tuesday, April 13  REMEMBER: Instructions that are not followed completely may result in serious medical risk, up to and including death; or upon the discretion of your surgeon and anesthesiologist your surgery may need to be rescheduled.  Do not eat food after midnight the night before surgery.  No gum chewing, lozengers or hard candies.  You may however, drink water up to 2 hours before you are scheduled to arrive for your surgery. Do not drink anything within 2 hours of the start of your surgery.  ENSURE PRE-SURGERY CARBOHYDRATE DRINK:  Complete drinking 2 hours prior to scheduled arrival time.  TAKE THESE MEDICATIONS THE MORNING OF SURGERY WITH A SIP OF WATER:  1.  Pantoprazole - (take one the night before and one on the morning of surgery - helps to prevent nausea after surgery.) 2.  Flomax 3.  Bethanechol  4.  Hydroxyzine  Take 1/2 of usual insulin dose the night before surgery and none on the morning of surgery. Only take 20 units of Lantus the night before surgery. No insulin on the morning of surgery.  Follow recommendations from Cardiologist, Pulmonologist or PCP regarding stopping Aspirin.  Stop Anti-inflammatories (NSAIDS) such as Advil, Aleve, Ibuprofen, Motrin, Naproxen, Naprosyn and Aspirin based products such as Excedrin, Goodys Powder, BC Powder. (May take Tylenol or Acetaminophen if needed.)  Stop ANY OVER THE COUNTER supplements until after surgery.  No Alcohol for 24 hours before or after surgery.  No Smoking including e-cigarettes for 24 hours prior to surgery.  No chewable tobacco products for at least 6 hours prior to surgery.  No nicotine patches on the day of surgery.  On the morning of surgery brush your teeth with toothpaste and water, you may rinse your mouth with  mouthwash if you wish. Do not swallow any toothpaste or mouthwash.  Do not wear jewelry, make-up, hairpins, clips or nail polish.  Do not wear lotions, powders, or perfumes.   Do not shave 48 hours prior to surgery.   Contact lenses, hearing aids and dentures may not be worn into surgery.  Do not bring valuables to the hospital, including drivers license, insurance or credit cards.  [facility] is not responsible for any belongings or valuables.   Use CHG Soap as directed on instruction sheet.  Notify your doctor if there is any change in your medical condition (cold, fever, infection).  Wear comfortable clothing (specific to your surgery type) to the hospital.  If you are being discharged the day of surgery, you will not be allowed to drive home. You will need a responsible adult to drive you home and stay with you that night.   If you are taking public transportation, you will need to have a responsible adult with you. Please confirm with your physician that it is acceptable to use public transportation.   Please call [phone] if you have any questions about these instructions.  Visitation Policy:  Patients undergoing a surgery or procedure in a hospital may have one family member or support person with them as long as that person is not COVID-19 positive or experiencing its symptoms. That person may remain in the waiting area during the procedure. Should the patient need to stay at the hospital during part of their recovery, the support person may visit during visiting  hours; 7 am to 8 pm.

## 2020-02-03 ENCOUNTER — Other Ambulatory Visit
Admission: RE | Admit: 2020-02-03 | Discharge: 2020-02-03 | Disposition: A | Discharge status: Home / Self Care | Payer: Medicaid Other | Source: Ambulatory Visit | Attending: Podiatry | Admitting: Podiatry

## 2020-02-03 ENCOUNTER — Other Ambulatory Visit: Payer: Self-pay

## 2020-02-03 DIAGNOSIS — Z20822 Contact with and (suspected) exposure to covid-19: Secondary | ICD-10-CM | POA: Diagnosis not present

## 2020-02-03 DIAGNOSIS — Z01812 Encounter for preprocedural laboratory examination: Secondary | ICD-10-CM | POA: Insufficient documentation

## 2020-02-03 LAB — SARS CORONAVIRUS 2 (TAT 6-24 HRS): SARS Coronavirus 2: NEGATIVE

## 2020-02-05 ENCOUNTER — Ambulatory Visit: Payer: Medicaid Other | Admitting: Anesthesiology

## 2020-02-05 ENCOUNTER — Other Ambulatory Visit: Payer: Self-pay

## 2020-02-05 ENCOUNTER — Ambulatory Visit
Admission: RE | Admit: 2020-02-05 | Discharge: 2020-02-05 | Disposition: A | Discharge status: Home / Self Care | Payer: Medicaid Other | Attending: Podiatry | Admitting: Podiatry

## 2020-02-05 ENCOUNTER — Encounter: Payer: Self-pay | Admitting: Podiatry

## 2020-02-05 ENCOUNTER — Encounter
Admission: RE | Disposition: A | Discharge status: Home / Self Care | Payer: Self-pay | Source: Home / Self Care | Attending: Podiatry

## 2020-02-05 DIAGNOSIS — E11621 Type 2 diabetes mellitus with foot ulcer: Secondary | ICD-10-CM | POA: Insufficient documentation

## 2020-02-05 DIAGNOSIS — E1169 Type 2 diabetes mellitus with other specified complication: Secondary | ICD-10-CM | POA: Diagnosis not present

## 2020-02-05 DIAGNOSIS — M86672 Other chronic osteomyelitis, left ankle and foot: Secondary | ICD-10-CM | POA: Insufficient documentation

## 2020-02-05 DIAGNOSIS — M86172 Other acute osteomyelitis, left ankle and foot: Secondary | ICD-10-CM | POA: Insufficient documentation

## 2020-02-05 DIAGNOSIS — E1142 Type 2 diabetes mellitus with diabetic polyneuropathy: Secondary | ICD-10-CM | POA: Insufficient documentation

## 2020-02-05 DIAGNOSIS — L97524 Non-pressure chronic ulcer of other part of left foot with necrosis of bone: Secondary | ICD-10-CM | POA: Diagnosis not present

## 2020-02-05 DIAGNOSIS — L03032 Cellulitis of left toe: Secondary | ICD-10-CM | POA: Diagnosis not present

## 2020-02-05 DIAGNOSIS — M869 Osteomyelitis, unspecified: Secondary | ICD-10-CM | POA: Diagnosis present

## 2020-02-05 HISTORY — PX: AMPUTATION TOE: SHX6595

## 2020-02-05 LAB — GLUCOSE, CAPILLARY
Glucose-Capillary: 62 mg/dL — ABNORMAL LOW (ref 70–99)
Glucose-Capillary: 113 mg/dL — ABNORMAL HIGH (ref 70–99)
Glucose-Capillary: 89 mg/dL (ref 70–99)

## 2020-02-05 SURGERY — AMPUTATION, TOE
Anesthesia: General | Site: Toe | Laterality: Left

## 2020-02-05 MED ORDER — VANCOMYCIN HCL 1000 MG IV SOLR
INTRAVENOUS | Status: AC
  Filled 2020-02-05: qty 1000

## 2020-02-05 MED ORDER — ROCURONIUM BROMIDE 10 MG/ML (PF) SYRINGE
PREFILLED_SYRINGE | INTRAVENOUS | Status: AC
  Filled 2020-02-05: qty 10

## 2020-02-05 MED ORDER — DEXAMETHASONE SODIUM PHOSPHATE 10 MG/ML IJ SOLN
INTRAMUSCULAR | Status: AC
  Filled 2020-02-05: qty 1

## 2020-02-05 MED ORDER — SUCCINYLCHOLINE CHLORIDE 200 MG/10ML IV SOSY
PREFILLED_SYRINGE | INTRAVENOUS | Status: AC
  Filled 2020-02-05: qty 10

## 2020-02-05 MED ORDER — LIDOCAINE HCL (PF) 2 % IJ SOLN
INTRAMUSCULAR | Status: AC
  Filled 2020-02-05: qty 10

## 2020-02-05 MED ORDER — AMOXICILLIN-POT CLAVULANATE 875-125 MG PO TABS: 1.0000 | ORAL_TABLET | Freq: Two times a day (BID) | ORAL | 0 refills | Status: AC

## 2020-02-05 MED ORDER — PROPOFOL 500 MG/50ML IV EMUL
INTRAVENOUS | Status: DC | PRN
  Administered 2020-02-05: 50 ug/kg/min via INTRAVENOUS

## 2020-02-05 MED ORDER — ONDANSETRON HCL 4 MG/2ML IJ SOLN
INTRAMUSCULAR | Status: DC | PRN
  Administered 2020-02-05: 4 mg via INTRAVENOUS

## 2020-02-05 MED ORDER — NEOMYCIN-POLYMYXIN B GU 40-200000 IR SOLN
Status: DC | PRN
  Administered 2020-02-05: 2 mL

## 2020-02-05 MED ORDER — EPHEDRINE 5 MG/ML INJ
INTRAVENOUS | Status: AC
  Filled 2020-02-05: qty 10

## 2020-02-05 MED ORDER — SODIUM CHLORIDE 0.9 % IV SOLN: INTRAVENOUS | Status: DC

## 2020-02-05 MED ORDER — FENTANYL CITRATE (PF) 100 MCG/2ML IJ SOLN
INTRAMUSCULAR | Status: AC
  Filled 2020-02-05: qty 2

## 2020-02-05 MED ORDER — ONDANSETRON HCL 4 MG PO TABS: 4.0000 mg | ORAL_TABLET | Freq: Three times a day (TID) | ORAL | 0 refills | Status: AC | PRN

## 2020-02-05 MED ORDER — NEOMYCIN-POLYMYXIN B GU 40-200000 IR SOLN
Status: AC
  Filled 2020-02-05: qty 2

## 2020-02-05 MED ORDER — DEXTROSE 50 % IV SOLN
INTRAVENOUS | Status: AC
  Administered 2020-02-05: 12.5 g via INTRAVENOUS
  Filled 2020-02-05: qty 50

## 2020-02-05 MED ORDER — MIDAZOLAM HCL 2 MG/2ML IJ SOLN
INTRAMUSCULAR | Status: AC
  Filled 2020-02-05: qty 2

## 2020-02-05 MED ORDER — OXYCODONE HCL 5 MG/5ML PO SOLN: 5.0000 mg | Freq: Once | ORAL | Status: DC | PRN

## 2020-02-05 MED ORDER — DEXTROSE 50 % IV SOLN: 12.5000 g | Freq: Once | INTRAVENOUS | Status: AC

## 2020-02-05 MED ORDER — FENTANYL CITRATE (PF) 100 MCG/2ML IJ SOLN
INTRAMUSCULAR | Status: DC | PRN
  Administered 2020-02-05 (×2): 25 ug via INTRAVENOUS

## 2020-02-05 MED ORDER — HYDROCODONE-ACETAMINOPHEN 5-325 MG PO TABS: 1.0000 | ORAL_TABLET | Freq: Four times a day (QID) | ORAL | 0 refills | Status: AC | PRN

## 2020-02-05 MED ORDER — PROPOFOL 10 MG/ML IV BOLUS
INTRAVENOUS | Status: DC | PRN
  Administered 2020-02-05 (×2): 30 mg via INTRAVENOUS

## 2020-02-05 MED ORDER — PROPOFOL 10 MG/ML IV BOLUS
INTRAVENOUS | Status: AC
  Filled 2020-02-05: qty 20

## 2020-02-05 MED ORDER — DIPHENHYDRAMINE HCL 50 MG/ML IJ SOLN
INTRAMUSCULAR | Status: AC
  Filled 2020-02-05: qty 1

## 2020-02-05 MED ORDER — FENTANYL CITRATE (PF) 100 MCG/2ML IJ SOLN: 25.0000 ug | INTRAMUSCULAR | Status: DC | PRN

## 2020-02-05 MED ORDER — MIDAZOLAM HCL 2 MG/2ML IJ SOLN
INTRAMUSCULAR | Status: DC | PRN
  Administered 2020-02-05: 2 mg via INTRAVENOUS

## 2020-02-05 MED ORDER — CEFAZOLIN SODIUM-DEXTROSE 2-4 GM/100ML-% IV SOLN: 2.0000 g | INTRAVENOUS | Status: DC

## 2020-02-05 MED ORDER — PROPOFOL 500 MG/50ML IV EMUL
INTRAVENOUS | Status: AC
  Filled 2020-02-05: qty 50

## 2020-02-05 MED ORDER — BUPIVACAINE HCL 0.5 % IJ SOLN
INTRAMUSCULAR | Status: DC | PRN
  Administered 2020-02-05: 10 mL

## 2020-02-05 MED ORDER — ONDANSETRON HCL 4 MG/2ML IJ SOLN: 4.0000 mg | Freq: Once | INTRAMUSCULAR | Status: DC | PRN

## 2020-02-05 MED ORDER — ACETAMINOPHEN 10 MG/ML IV SOLN: 1000.0000 mg | Freq: Once | INTRAVENOUS | Status: DC | PRN

## 2020-02-05 MED ORDER — BUPIVACAINE HCL (PF) 0.5 % IJ SOLN
INTRAMUSCULAR | Status: AC
  Filled 2020-02-05: qty 30

## 2020-02-05 MED ORDER — CEFAZOLIN SODIUM-DEXTROSE 2-4 GM/100ML-% IV SOLN
INTRAVENOUS | Status: AC
  Filled 2020-02-05: qty 100

## 2020-02-05 MED ORDER — ONDANSETRON HCL 4 MG/2ML IJ SOLN
INTRAMUSCULAR | Status: AC
  Filled 2020-02-05: qty 2

## 2020-02-05 MED ORDER — POVIDONE-IODINE 7.5 % EX SOLN
Freq: Once | CUTANEOUS | Status: DC
  Filled 2020-02-05: qty 118

## 2020-02-05 MED ORDER — LACTATED RINGERS IV SOLN: INTRAVENOUS | Status: DC | PRN

## 2020-02-05 MED ORDER — OXYCODONE HCL 5 MG PO TABS: 5.0000 mg | ORAL_TABLET | Freq: Once | ORAL | Status: DC | PRN

## 2020-02-05 SURGICAL SUPPLY — 49 items
BLADE MED AGGRESSIVE (BLADE) ×2 IMPLANT
BLADE OSC/SAGITTAL MD 5.5X18 (BLADE) ×1 IMPLANT
BLADE SURG 15 STRL LF DISP TIS (BLADE) IMPLANT
BLADE SURG 15 STRL SS (BLADE)
BLADE SURG MINI STRL (BLADE) IMPLANT
BNDG CONFORM 2 STRL LF (GAUZE/BANDAGES/DRESSINGS) IMPLANT
BNDG ELASTIC 4X5.8 VLCR STR LF (GAUZE/BANDAGES/DRESSINGS) ×2 IMPLANT
BNDG ESMARK 4X12 TAN STRL LF (GAUZE/BANDAGES/DRESSINGS) ×2 IMPLANT
BNDG GAUZE 4.5X4.1 6PLY STRL (MISCELLANEOUS) ×2 IMPLANT
CANISTER SUCT 1200ML W/VALVE (MISCELLANEOUS) ×2 IMPLANT
CNTNR SPEC 2.5X3XGRAD LEK (MISCELLANEOUS)
CONT SPEC 4OZ STER OR WHT (MISCELLANEOUS)
CONT SPEC 4OZ STRL OR WHT (MISCELLANEOUS)
CONTAINER SPEC 2.5X3XGRAD LEK (MISCELLANEOUS) ×1 IMPLANT
COVER WAND RF STERILE (DRAPES) ×2 IMPLANT
CUFF TOURN 18 STER (MISCELLANEOUS) ×1 IMPLANT
CUFF TOURN DUAL PL 12 NO SLV (MISCELLANEOUS) IMPLANT
DRAPE FLUOR MINI C-ARM 54X84 (DRAPES) IMPLANT
DURAPREP 26ML APPLICATOR (WOUND CARE) ×2 IMPLANT
ELECT REM PT RETURN 9FT ADLT (ELECTROSURGICAL) ×2
ELECTRODE REM PT RTRN 9FT ADLT (ELECTROSURGICAL) ×1 IMPLANT
GAUZE SPONGE 4X4 12PLY STRL (GAUZE/BANDAGES/DRESSINGS) ×3 IMPLANT
GAUZE XEROFORM 1X8 LF (GAUZE/BANDAGES/DRESSINGS) ×2 IMPLANT
GLOVE BIO SURGEON STRL SZ7 (GLOVE) ×2 IMPLANT
GLOVE INDICATOR 7.0 STRL GRN (GLOVE) ×2 IMPLANT
GOWN STRL REUS W/ TWL LRG LVL3 (GOWN DISPOSABLE) ×2 IMPLANT
GOWN STRL REUS W/TWL LRG LVL3 (GOWN DISPOSABLE) ×4
HANDPIECE VERSAJET DEBRIDEMENT (MISCELLANEOUS) IMPLANT
KIT TURNOVER KIT A (KITS) ×2 IMPLANT
LABEL OR SOLS (LABEL) ×2 IMPLANT
NDL FILTER BLUNT 18X1 1/2 (NEEDLE) ×1 IMPLANT
NDL HYPO 25X1 1.5 SAFETY (NEEDLE) ×2 IMPLANT
NEEDLE FILTER BLUNT 18X 1/2SAF (NEEDLE) ×1
NEEDLE FILTER BLUNT 18X1 1/2 (NEEDLE) ×1 IMPLANT
NEEDLE HYPO 25X1 1.5 SAFETY (NEEDLE) ×4 IMPLANT
NS IRRIG 500ML POUR BTL (IV SOLUTION) ×2 IMPLANT
PACK EXTREMITY (MISCELLANEOUS) ×2 IMPLANT
SOL .9 NS 3000ML IRR  AL (IV SOLUTION)
SOL .9 NS 3000ML IRR AL (IV SOLUTION)
SOL .9 NS 3000ML IRR UROMATIC (IV SOLUTION) IMPLANT
SOL PREP PVP 2OZ (MISCELLANEOUS)
SOLUTION PREP PVP 2OZ (MISCELLANEOUS) ×1 IMPLANT
STOCKINETTE STRL 6IN 960660 (GAUZE/BANDAGES/DRESSINGS) ×2 IMPLANT
SUT ETHILON 3-0 FS-10 30 BLK (SUTURE) ×2
SUT VIC AB 3-0 SH 27 (SUTURE) ×2
SUT VIC AB 3-0 SH 27X BRD (SUTURE) ×1 IMPLANT
SUTURE EHLN 3-0 FS-10 30 BLK (SUTURE) ×2 IMPLANT
SWAB CULTURE AMIES ANAERIB BLU (MISCELLANEOUS) IMPLANT
SYR 10ML LL (SYRINGE) ×2 IMPLANT

## 2020-02-05 NOTE — H&P (Signed)
HISTORY AND PHYSICAL INTERVAL NOTE:  02/05/2020  11:50 AM  [name]  has presented today for surgery, with the diagnosis of E11.42 TYPE 2 DIABETES L03.032 CELLULITIS LEFT TOE L21.747 SKIN ULCER LEFT TOE, OSTEOMYELITIS TOE LEFT 4TH.  The various methods of treatment have been discussed with the patient.  No guarantees were given.  After consideration of risks, benefits and other options for treatment, the patient has consented to surgery.  I have reviewed the patients' chart and labs.    PROCEDURE: PARTIAL AMPUTATION OF THE LEFT 4TH TOE  A history and physical examination was performed in my office.  The patient was reexamined.  There have been no changes to this history and physical examination.  Caroline More, DPM

## 2020-02-05 NOTE — Transfer of Care (Signed)
Immediate Anesthesia Transfer of Care Note  Patient: [name]  Procedure(s) Performed: AMPUTATION MPJ LEFT 4TH (Left Toe)  Patient Location: PACU  Anesthesia Type:General  Level of Consciousness: sedated  Airway & Oxygen Therapy: Patient Spontanous Breathing and Patient connected to face mask oxygen  Post-op Assessment: Report given to RN and Post -op Vital signs reviewed and stable  Post vital signs: Reviewed and stable  Last Vitals:  Vitals Value Taken Time  BP 113/91 02/05/20 1359  Temp 36.9 C 02/05/20 1314  Pulse 100 02/05/20 1400  Resp 13 02/05/20 1359  SpO2 97 % 02/05/20 1400  Vitals shown include unvalidated device data.  Last Pain:  Vitals:   02/05/20 1329  TempSrc:   PainSc: Asleep         Complications: No apparent anesthesia complications

## 2020-02-05 NOTE — Discharge Instructions (Signed)
[place] DR. TROXLER, DR. Vickki Muff, AND DR. Laporte   1. Take your medication as prescribed.  Pain medication should be taken only as needed.  2. Keep the dressing clean, dry and intact.  Ambulate in surgical shoe at all times and try to walk on heel as the incision site to your left fourth toe is still healing.  Try to stay off of your feet as much as possible until the incision site is healed (2-3 weeks)  3. Keep your foot elevated above the heart level for the first 48 hours.  4. Walking to the bathroom and brief periods of walking are acceptable, unless we have instructed you to be non-weight bearing.  5. Always wear your post-op shoe when walking.  Always use your crutches if you are to be non-weight bearing.  6. Do not take a shower. Baths are permissible as long as the foot is kept out of the water and dressing stays clean.  7. Every hour you are awake:  - Bend your knee 15 times. - Flex foot 15 times - Massage calf 15 times  8. Call [facility] [phone]) if any of the following problems occur: - You develop a temperature or fever. - The bandage becomes saturated with blood. - Medication does not stop your pain. - Injury of the foot occurs. - Any symptoms of infection including redness, odor, or red streaks running from wound.    AMBULATORY SURGERY  DISCHARGE INSTRUCTIONS   1) The drugs that you were given will stay in your system until tomorrow so for the next 24 hours you should not:  A) Drive an automobile B) Make any legal decisions C) Drink any alcoholic beverage   2) You may resume regular meals tomorrow.  Today it is better to start with liquids and gradually work up to solid foods.  You may eat anything you prefer, but it is better to start with liquids, then soup and crackers, and gradually work up to solid foods.   3) Please notify  your doctor immediately if you have any unusual bleeding, trouble breathing, redness and pain at the surgery site, drainage, fever, or pain not relieved by medication.    4) Additional Instructions: Please contact your physician with any problems or Same Day Surgery at [phone], Monday through Friday 6 am to 4 pm, or [place] at [facility] number at [phone].

## 2020-02-05 NOTE — Op Note (Signed)
PODIATRY / FOOT AND ANKLE SURGERY OPERATIVE REPORT    SURGEON: Caroline More, DPM  PRE-OPERATIVE DIAGNOSIS:  1. Left 4th toe osteomyelitis 2. Left 4th toe cellulitis associated with distal ulceration with bone necrosis 3. DM2 with polyneuropathy  POST-OPERATIVE DIAGNOSIS: same  PROCEDURE(S): 1. Left 4th toe partial amputation  HEMOSTASIS: ankle tourniquet  ANESTHESIA: MAC  ESTIMATED BLOOD LOSS: 5 cc  FINDING(S): 1.  Left 4th toe distal phalanx osteomyelitis, clean margin at PIPJ level  PATHOLOGY/SPECIMEN(S):  Left 4th toe, proximal phalanx head proximal margin marked with ink.  INDICATIONS:   [name] is a 44 y.o. female who presents with a nonhealing ulceration to the distal tip of the left fourth toe with necrosis of bone.  Patient has been treated with local wound care for this particular issue but neglected to put dressings on the area as well as use the buttress pad.  She presented in clinic last week with worsening redness and swelling to the left foot coming from the fourth toe and extending to the dorsum of the foot.  Patient was placed on an antibiotic at that time and x-rays were taken showing osteomyelitis to the distal tip into the DIPJ of the left fourth toe.  Discussed all treatment options with the patient both conservative and surgical attempts at correction include potential risks and complications of surgical intervention at this time patient has elected for partial amputation of the left fourth toe.  DESCRIPTION: After obtaining full informed written consent, the patient was brought back to the operating room and placed supine upon the operating table.  The patient received IV antibiotics prior to induction.  10 cc of half percent Marcaine plain was injected about the left fourth toe for digital type block.  After obtaining adequate anesthesia, the patient was prepped and draped in the standard fashion.  An Esmarch bandage was used to exsanguinate the left  lower extremity and the pneumatic ankle tourniquet was inflated.  Attention was directed to the left fourth toe at the level of the PIPJ where a linear longitudinal incision was made such to create 2 semielliptical incisions at the dorsal and plantar aspects of the fourth toe.  An extensor tenotomy capsulotomy was performed followed by release the collateral ligaments at the PIPJ and a release of the flexor tendon was performed and the fourth toe was disarticulated at the PIPJ and passed off the operative site and sent off for pathology.  The head of the proximal phalanx appeared to be fairly viable with some cartilage erosion so it was determined to take a small portion of the head of the proximal phalanx as well which was resected with a sagittal bone saw and passed off the operative site.  The proximal margin was marked in purple ink and sent off for pathology.  The extensor and flexor tendons were cut as far proximally as possible.  The skin was then reapproximated well coapted with 4-0 nylon in simple and horizontal mattress type stitching.  The pneumatic ankle tourniquet was deflated and a prompt hyperemic response was noted to all digits that remained to the left foot.  Postoperative dressing was applied consisting of Xeroform followed by 4 x 4 gauze, Kerlix, Ace wrap.  Patient tolerated procedure and anesthesia well was transferred to recovery room vital signs stable vascular status intact to all remaining digits of the left foot.  Patient will be discharged home with the appropriate discharge information and prescriptions have been sent in.  Patient is to be partial weightbearing  with heel contact at all times in a surgical shoe and try to stay off the left foot as much possible and keep dressings clean, dry, and intact.  COMPLICATIONS: None  CONDITION: Good, stable  Caroline More, DPM

## 2020-02-05 NOTE — Anesthesia Preprocedure Evaluation (Signed)
Anesthesia Evaluation  Patient identified by MRN, date of birth, ID band Patient awake    Reviewed: Allergy & Precautions, H&P , NPO status , Patient's Chart, lab work & pertinent test results, reviewed documented beta blocker date and time   Airway Mallampati: II  TM Distance: >3 FB Neck ROM: full    Dental  (+) Edentulous Upper, Edentulous Lower   Pulmonary shortness of breath and with exertion, neg sleep apnea, COPD, neg recent URI, Current Smoker,           Cardiovascular Exercise Tolerance: Good hypertension, (-) angina+CHF  (-) Past MI and (-) Cardiac Stents (-) dysrhythmias (-) Valvular Problems/Murmurs  TTE 2020: 1. The left ventricle has normal systolic function, with an ejection  fraction of 55-60%. The cavity size was normal.  2. The right ventricle has normal systolc function. The cavity was  normal.  3. No evidence of a thrombus present in the left atrial appendage.  4. Trivial pericardial effusion is present.  5. Moderate thickening of the mitral valve leaflet. The MR jet is  posteriorly-directed. Mild mitral valve stenosis. A small vegetation is  seen on the posterior mitral leaflet. The vegetation measures 5 mm by 4  mm.  6. Irregular thickening of the anterior mitral leaflet is nonspecific but  could represent infectious involvement of this leaflet as well versus  degenerative changes.  7. No trisuspid valve vegetation visualized.  8. No vegetation on the aortic valve.  9. The aortic valve is tricuspid.  10. The aortic root is normal in size and structure.    Neuro/Psych Seizures -, Well Controlled,  PSYCHIATRIC DISORDERS Anxiety Bipolar Disorder  Neuromuscular disease    GI/Hepatic Neg liver ROS, GERD  ,  Endo/Other  diabetes  Renal/GU negative Renal ROS  negative genitourinary   Musculoskeletal   Abdominal   Peds  Hematology negative hematology ROS (+)   Anesthesia Other Findings Past  Medical History: No date: Chronic combined systolic and diastolic CHF (congestive  heart failure) (HCC)     Comment:  a. ECHO 04/23/14 LV EF: 45% - 50%,  mild LVH, diffuse               hypokinesis. LV diastolic dysfunction, high ventricular               filling pressure. Mild LA dilation. No date: Cocaine abuse (HCC) No date: Diabetes mellitus without complication (HCC) No date: Hypertension No date: Seizures (HCC)   Reproductive/Obstetrics negative OB ROS                             Anesthesia Physical  Anesthesia Plan  ASA: III  Anesthesia Plan: General   Post-op Pain Management:    Induction: Intravenous  PONV Risk Score and Plan: 2 and Ondansetron, Dexamethasone, Midazolam, Promethazine and Treatment may vary due to age or medical condition  Airway Management Planned: LMA  Additional Equipment:   Intra-op Plan:   Post-operative Plan: Extubation in OR  Informed Consent: I have reviewed the patients History and Physical, chart, labs and discussed the procedure including the risks, benefits and alternatives for the proposed anesthesia with the patient or authorized representative who has indicated his/her understanding and acceptance.     Dental Advisory Given  Plan Discussed with: Anesthesiologist, CRNA and Surgeon  Anesthesia Plan Comments:         Anesthesia Quick Evaluation

## 2020-02-06 NOTE — Anesthesia Postprocedure Evaluation (Signed)
Anesthesia Post Note  Patient: [name]  Procedure(s) Performed: AMPUTATION MPJ LEFT 4TH (Left Toe)  Patient location during evaluation: PACU Anesthesia Type: General Level of consciousness: awake and alert Pain management: pain level controlled Vital Signs Assessment: post-procedure vital signs reviewed and stable Respiratory status: spontaneous breathing, nonlabored ventilation, respiratory function stable and patient connected to nasal cannula oxygen Cardiovascular status: blood pressure returned to baseline and stable Postop Assessment: no apparent nausea or vomiting Anesthetic complications: no     Last Vitals:  Vitals:   02/05/20 1344 02/05/20 1407  BP: 115/81 116/86  Pulse: 98 90  Resp: 13 18  Temp: 36.7 C 36.9 C  SpO2: 96% 99%    Last Pain:  Vitals:   02/05/20 1407  TempSrc: Temporal  PainSc: 0-No pain                 Arita Miss

## 2020-02-07 LAB — SURGICAL PATHOLOGY

## 2020-03-30 ENCOUNTER — Inpatient Hospital Stay
Admission: EM | Admit: 2020-03-30 | Discharge: 2020-04-02 | DRG: 617 | Disposition: A | Discharge status: Home / Self Care | Payer: Medicaid Other | Attending: Internal Medicine | Admitting: Internal Medicine

## 2020-03-30 ENCOUNTER — Encounter: Payer: Self-pay | Admitting: Emergency Medicine

## 2020-03-30 ENCOUNTER — Emergency Department: Payer: Medicaid Other

## 2020-03-30 ENCOUNTER — Other Ambulatory Visit: Payer: Self-pay

## 2020-03-30 DIAGNOSIS — E1165 Type 2 diabetes mellitus with hyperglycemia: Secondary | ICD-10-CM | POA: Diagnosis present

## 2020-03-30 DIAGNOSIS — Z9049 Acquired absence of other specified parts of digestive tract: Secondary | ICD-10-CM | POA: Diagnosis not present

## 2020-03-30 DIAGNOSIS — L03116 Cellulitis of left lower limb: Secondary | ICD-10-CM

## 2020-03-30 DIAGNOSIS — Z20822 Contact with and (suspected) exposure to covid-19: Secondary | ICD-10-CM | POA: Diagnosis present

## 2020-03-30 DIAGNOSIS — Z90710 Acquired absence of both cervix and uterus: Secondary | ICD-10-CM | POA: Diagnosis not present

## 2020-03-30 DIAGNOSIS — E11621 Type 2 diabetes mellitus with foot ulcer: Secondary | ICD-10-CM | POA: Diagnosis present

## 2020-03-30 DIAGNOSIS — J449 Chronic obstructive pulmonary disease, unspecified: Secondary | ICD-10-CM | POA: Diagnosis present

## 2020-03-30 DIAGNOSIS — Z89422 Acquired absence of other left toe(s): Secondary | ICD-10-CM

## 2020-03-30 DIAGNOSIS — N1831 Chronic kidney disease, stage 3a: Secondary | ICD-10-CM | POA: Diagnosis present

## 2020-03-30 DIAGNOSIS — L8990 Pressure ulcer of unspecified site, unspecified stage: Secondary | ICD-10-CM | POA: Diagnosis present

## 2020-03-30 DIAGNOSIS — I5042 Chronic combined systolic (congestive) and diastolic (congestive) heart failure: Secondary | ICD-10-CM | POA: Diagnosis present

## 2020-03-30 DIAGNOSIS — M86172 Other acute osteomyelitis, left ankle and foot: Secondary | ICD-10-CM | POA: Diagnosis not present

## 2020-03-30 DIAGNOSIS — E119 Type 2 diabetes mellitus without complications: Secondary | ICD-10-CM

## 2020-03-30 DIAGNOSIS — I342 Nonrheumatic mitral (valve) stenosis: Secondary | ICD-10-CM | POA: Diagnosis not present

## 2020-03-30 DIAGNOSIS — Z7982 Long term (current) use of aspirin: Secondary | ICD-10-CM | POA: Diagnosis not present

## 2020-03-30 DIAGNOSIS — Z09 Encounter for follow-up examination after completed treatment for conditions other than malignant neoplasm: Secondary | ICD-10-CM

## 2020-03-30 DIAGNOSIS — S8263XA Displaced fracture of lateral malleolus of unspecified fibula, initial encounter for closed fracture: Secondary | ICD-10-CM | POA: Diagnosis present

## 2020-03-30 DIAGNOSIS — E785 Hyperlipidemia, unspecified: Secondary | ICD-10-CM | POA: Diagnosis present

## 2020-03-30 DIAGNOSIS — W19XXXA Unspecified fall, initial encounter: Secondary | ICD-10-CM | POA: Diagnosis present

## 2020-03-30 DIAGNOSIS — E118 Type 2 diabetes mellitus with unspecified complications: Secondary | ICD-10-CM

## 2020-03-30 DIAGNOSIS — E1151 Type 2 diabetes mellitus with diabetic peripheral angiopathy without gangrene: Secondary | ICD-10-CM | POA: Diagnosis present

## 2020-03-30 DIAGNOSIS — E1121 Type 2 diabetes mellitus with diabetic nephropathy: Secondary | ICD-10-CM | POA: Diagnosis not present

## 2020-03-30 DIAGNOSIS — E669 Obesity, unspecified: Secondary | ICD-10-CM | POA: Diagnosis present

## 2020-03-30 DIAGNOSIS — G40909 Epilepsy, unspecified, not intractable, without status epilepticus: Secondary | ICD-10-CM | POA: Diagnosis present

## 2020-03-30 DIAGNOSIS — L89621 Pressure ulcer of left heel, stage 1: Secondary | ICD-10-CM | POA: Diagnosis present

## 2020-03-30 DIAGNOSIS — K219 Gastro-esophageal reflux disease without esophagitis: Secondary | ICD-10-CM | POA: Diagnosis present

## 2020-03-30 DIAGNOSIS — L97529 Non-pressure chronic ulcer of other part of left foot with unspecified severity: Secondary | ICD-10-CM | POA: Diagnosis present

## 2020-03-30 DIAGNOSIS — Z79899 Other long term (current) drug therapy: Secondary | ICD-10-CM

## 2020-03-30 DIAGNOSIS — Z794 Long term (current) use of insulin: Secondary | ICD-10-CM | POA: Diagnosis not present

## 2020-03-30 DIAGNOSIS — E1142 Type 2 diabetes mellitus with diabetic polyneuropathy: Secondary | ICD-10-CM | POA: Diagnosis present

## 2020-03-30 DIAGNOSIS — M869 Osteomyelitis, unspecified: Secondary | ICD-10-CM | POA: Diagnosis present

## 2020-03-30 DIAGNOSIS — I130 Hypertensive heart and chronic kidney disease with heart failure and stage 1 through stage 4 chronic kidney disease, or unspecified chronic kidney disease: Secondary | ICD-10-CM | POA: Diagnosis present

## 2020-03-30 DIAGNOSIS — F319 Bipolar disorder, unspecified: Secondary | ICD-10-CM | POA: Diagnosis present

## 2020-03-30 DIAGNOSIS — I5022 Chronic systolic (congestive) heart failure: Secondary | ICD-10-CM | POA: Diagnosis present

## 2020-03-30 DIAGNOSIS — M868X7 Other osteomyelitis, ankle and foot: Secondary | ICD-10-CM

## 2020-03-30 DIAGNOSIS — E1169 Type 2 diabetes mellitus with other specified complication: Principal | ICD-10-CM | POA: Diagnosis present

## 2020-03-30 DIAGNOSIS — I340 Nonrheumatic mitral (valve) insufficiency: Secondary | ICD-10-CM | POA: Diagnosis not present

## 2020-03-30 DIAGNOSIS — B9561 Methicillin susceptible Staphylococcus aureus infection as the cause of diseases classified elsewhere: Secondary | ICD-10-CM | POA: Diagnosis present

## 2020-03-30 DIAGNOSIS — F17210 Nicotine dependence, cigarettes, uncomplicated: Secondary | ICD-10-CM | POA: Diagnosis present

## 2020-03-30 DIAGNOSIS — Z7901 Long term (current) use of anticoagulants: Secondary | ICD-10-CM

## 2020-03-30 DIAGNOSIS — I10 Essential (primary) hypertension: Secondary | ICD-10-CM | POA: Diagnosis present

## 2020-03-30 DIAGNOSIS — E1122 Type 2 diabetes mellitus with diabetic chronic kidney disease: Secondary | ICD-10-CM | POA: Diagnosis present

## 2020-03-30 LAB — CBC WITH DIFFERENTIAL/PLATELET
Abs Immature Granulocytes: 0.03 10*3/uL (ref 0.00–0.07)
Basophils Absolute: 0 10*3/uL (ref 0.0–0.1)
Basophils Relative: 0 %
Eosinophils Absolute: 0.2 10*3/uL (ref 0.0–0.5)
Eosinophils Relative: 2 %
HCT: 34.9 % — ABNORMAL LOW (ref 36.0–46.0)
Hemoglobin: 11.6 g/dL — ABNORMAL LOW (ref 12.0–15.0)
Immature Granulocytes: 0 %
Lymphocytes Relative: 15 %
Lymphs Abs: 1.1 10*3/uL (ref 0.7–4.0)
MCH: 28.9 pg (ref 26.0–34.0)
MCHC: 33.2 g/dL (ref 30.0–36.0)
MCV: 87 fL (ref 80.0–100.0)
Monocytes Absolute: 0.4 10*3/uL (ref 0.1–1.0)
Monocytes Relative: 5 %
Neutro Abs: 5.9 10*3/uL (ref 1.7–7.7)
Neutrophils Relative %: 78 %
Platelets: 475 10*3/uL — ABNORMAL HIGH (ref 150–400)
RBC: 4.01 MIL/uL (ref 3.87–5.11)
RDW: 13.9 % (ref 11.5–15.5)
WBC: 7.7 10*3/uL (ref 4.0–10.5)
nRBC: 0 % (ref 0.0–0.2)

## 2020-03-30 LAB — PROTIME-INR
INR: 1.1 (ref 0.8–1.2)
Prothrombin Time: 13.7 seconds (ref 11.4–15.2)

## 2020-03-30 LAB — COMPREHENSIVE METABOLIC PANEL
ALT: 23 U/L (ref 0–44)
AST: 19 U/L (ref 15–41)
Albumin: 3.2 g/dL — ABNORMAL LOW (ref 3.5–5.0)
Alkaline Phosphatase: 88 U/L (ref 38–126)
Anion gap: 9 (ref 5–15)
BUN: 15 mg/dL (ref 6–20)
CO2: 26 mmol/L (ref 22–32)
Calcium: 8.8 mg/dL — ABNORMAL LOW (ref 8.9–10.3)
Chloride: 100 mmol/L (ref 98–111)
Creatinine, Ser: 1.62 mg/dL — ABNORMAL HIGH (ref 0.44–1.00)
GFR calc Af Amer: 44 mL/min — ABNORMAL LOW (ref >60)
GFR calc non Af Amer: 38 mL/min — ABNORMAL LOW (ref >60)
Glucose, Bld: 236 mg/dL — ABNORMAL HIGH (ref 70–99)
Potassium: 3.5 mmol/L (ref 3.5–5.1)
Sodium: 135 mmol/L (ref 135–145)
Total Bilirubin: 0.6 mg/dL (ref 0.3–1.2)
Total Protein: 7.8 g/dL (ref 6.5–8.1)

## 2020-03-30 LAB — SEDIMENTATION RATE: Sed Rate: 73 mm/hr — ABNORMAL HIGH (ref 0–20)

## 2020-03-30 LAB — SURGICAL PCR SCREEN
MRSA, PCR: NEGATIVE
Staphylococcus aureus: NEGATIVE

## 2020-03-30 LAB — GLUCOSE, CAPILLARY
Glucose-Capillary: 211 mg/dL — ABNORMAL HIGH (ref 70–99)
Glucose-Capillary: 152 mg/dL — ABNORMAL HIGH (ref 70–99)

## 2020-03-30 LAB — C-REACTIVE PROTEIN: CRP: 5.3 mg/dL — ABNORMAL HIGH (ref <1.0)

## 2020-03-30 LAB — LACTIC ACID, PLASMA
Lactic Acid, Venous: 2.6 mmol/L (ref 0.5–1.9)
Lactic Acid, Venous: 1.9 mmol/L (ref 0.5–1.9)

## 2020-03-30 LAB — SARS CORONAVIRUS 2 BY RT PCR (HOSPITAL ORDER, PERFORMED IN ~~LOC~~ HOSPITAL LAB): SARS Coronavirus 2: NEGATIVE

## 2020-03-30 MED ORDER — BISACODYL 5 MG PO TBEC: 5.0000 mg | DELAYED_RELEASE_TABLET | Freq: Every day | ORAL | Status: DC | PRN

## 2020-03-30 MED ORDER — ACETAMINOPHEN 325 MG PO TABS
650.0000 mg | ORAL_TABLET | Freq: Four times a day (QID) | ORAL | Status: DC | PRN
  Administered 2020-04-01: 650 mg via ORAL
  Filled 2020-03-30: qty 2

## 2020-03-30 MED ORDER — SODIUM CHLORIDE 0.9 % IV SOLN
2.0000 g | INTRAVENOUS | Status: DC
  Administered 2020-03-31 – 2020-04-02 (×3): 2 g via INTRAVENOUS
  Filled 2020-03-30: qty 2
  Filled 2020-03-30: qty 20
  Filled 2020-03-30 (×2): qty 2

## 2020-03-30 MED ORDER — METOPROLOL TARTRATE 25 MG PO TABS: 25.0000 mg | ORAL_TABLET | Freq: Every day | ORAL | Status: DC

## 2020-03-30 MED ORDER — SODIUM CHLORIDE 0.9 % IV BOLUS
1000.0000 mL | Freq: Once | INTRAVENOUS | Status: AC
  Administered 2020-03-30: 1000 mL via INTRAVENOUS

## 2020-03-30 MED ORDER — QUETIAPINE FUMARATE 300 MG PO TABS
800.0000 mg | ORAL_TABLET | Freq: Every day | ORAL | Status: DC
  Administered 2020-03-30 – 2020-04-01 (×2): 800 mg via ORAL
  Filled 2020-03-30 (×4): qty 1

## 2020-03-30 MED ORDER — ONDANSETRON HCL 4 MG/2ML IJ SOLN: 4.0000 mg | Freq: Four times a day (QID) | INTRAMUSCULAR | Status: DC | PRN

## 2020-03-30 MED ORDER — HYDROXYZINE HCL 50 MG PO TABS
50.0000 mg | ORAL_TABLET | Freq: Two times a day (BID) | ORAL | Status: DC
  Administered 2020-03-30 – 2020-04-02 (×4): 50 mg via ORAL
  Filled 2020-03-30 (×7): qty 1

## 2020-03-30 MED ORDER — BETHANECHOL CHLORIDE 25 MG PO TABS
25.0000 mg | ORAL_TABLET | Freq: Two times a day (BID) | ORAL | Status: DC
  Administered 2020-03-30 – 2020-04-02 (×4): 25 mg via ORAL
  Filled 2020-03-30 (×7): qty 1

## 2020-03-30 MED ORDER — CEFTRIAXONE SODIUM IN DEXTROSE 40 MG/ML IV SOLN
2.0000 g | INTRAVENOUS | Status: DC
  Filled 2020-03-30: qty 50

## 2020-03-30 MED ORDER — INSULIN ASPART 100 UNIT/ML ~~LOC~~ SOLN
0.0000 [IU] | SUBCUTANEOUS | Status: DC
  Administered 2020-03-31 – 2020-04-01 (×3): 3 [IU] via SUBCUTANEOUS
  Administered 2020-04-01: 2 [IU] via SUBCUTANEOUS
  Administered 2020-04-01 – 2020-04-02 (×5): 3 [IU] via SUBCUTANEOUS
  Filled 2020-03-30 (×9): qty 1

## 2020-03-30 MED ORDER — HEPARIN SODIUM (PORCINE) 5000 UNIT/ML IJ SOLN
5000.0000 [IU] | Freq: Three times a day (TID) | INTRAMUSCULAR | Status: DC
  Administered 2020-03-30 – 2020-04-01 (×3): 5000 [IU] via SUBCUTANEOUS
  Filled 2020-03-30 (×3): qty 1

## 2020-03-30 MED ORDER — SENNOSIDES-DOCUSATE SODIUM 8.6-50 MG PO TABS: 1.0000 | ORAL_TABLET | Freq: Every evening | ORAL | Status: DC | PRN

## 2020-03-30 MED ORDER — CHLORHEXIDINE GLUCONATE 4 % EX LIQD
60.0000 mL | Freq: Once | CUTANEOUS | Status: AC
  Administered 2020-03-31: 4 via TOPICAL

## 2020-03-30 MED ORDER — METOPROLOL TARTRATE 25 MG PO TABS
25.0000 mg | ORAL_TABLET | Freq: Two times a day (BID) | ORAL | Status: DC
  Administered 2020-03-30 – 2020-04-02 (×4): 25 mg via ORAL
  Filled 2020-03-30 (×4): qty 1

## 2020-03-30 MED ORDER — ENSURE PRE-SURGERY PO LIQD
296.0000 mL | Freq: Once | ORAL | Status: DC
  Administered 2020-03-31: 296 mL via ORAL
  Filled 2020-03-30: qty 296

## 2020-03-30 MED ORDER — PRAZOSIN HCL 5 MG PO CAPS
5.0000 mg | ORAL_CAPSULE | Freq: Every day | ORAL | Status: DC
  Administered 2020-03-30: 5 mg via ORAL
  Filled 2020-03-30 (×4): qty 1

## 2020-03-30 MED ORDER — FUROSEMIDE 20 MG PO TABS
20.0000 mg | ORAL_TABLET | Freq: Every day | ORAL | Status: DC
  Administered 2020-03-30 – 2020-04-02 (×3): 20 mg via ORAL
  Filled 2020-03-30 (×3): qty 1

## 2020-03-30 MED ORDER — AMITRIPTYLINE HCL 25 MG PO TABS
150.0000 mg | ORAL_TABLET | Freq: Every day | ORAL | Status: DC
  Administered 2020-03-30: 150 mg via ORAL
  Filled 2020-03-30: qty 6

## 2020-03-30 MED ORDER — TAMSULOSIN HCL 0.4 MG PO CAPS
0.4000 mg | ORAL_CAPSULE | Freq: Two times a day (BID) | ORAL | Status: DC
  Administered 2020-03-30 – 2020-04-02 (×3): 0.4 mg via ORAL
  Filled 2020-03-30 (×3): qty 1

## 2020-03-30 MED ORDER — ACETAMINOPHEN 650 MG RE SUPP: 650.0000 mg | Freq: Four times a day (QID) | RECTAL | Status: DC | PRN

## 2020-03-30 MED ORDER — PANTOPRAZOLE SODIUM 20 MG PO TBEC
20.0000 mg | DELAYED_RELEASE_TABLET | Freq: Every day | ORAL | Status: DC
  Administered 2020-03-30 – 2020-04-02 (×4): 20 mg via ORAL
  Filled 2020-03-30 (×4): qty 1

## 2020-03-30 MED ORDER — VANCOMYCIN HCL 2000 MG/400ML IV SOLN
2000.0000 mg | Freq: Once | INTRAVENOUS | Status: AC
  Administered 2020-03-30: 2000 mg via INTRAVENOUS
  Filled 2020-03-30 (×2): qty 400

## 2020-03-30 MED ORDER — VANCOMYCIN HCL 750 MG/150ML IV SOLN
750.0000 mg | Freq: Two times a day (BID) | INTRAVENOUS | Status: DC
  Administered 2020-03-31 – 2020-04-02 (×4): 750 mg via INTRAVENOUS
  Filled 2020-03-30 (×7): qty 150

## 2020-03-30 MED ORDER — ATORVASTATIN CALCIUM 20 MG PO TABS
20.0000 mg | ORAL_TABLET | Freq: Every day | ORAL | Status: DC
  Administered 2020-03-30 – 2020-04-01 (×2): 20 mg via ORAL
  Filled 2020-03-30 (×2): qty 1

## 2020-03-30 MED ORDER — IPRATROPIUM-ALBUTEROL 0.5-2.5 (3) MG/3ML IN SOLN: 3.0000 mL | Freq: Four times a day (QID) | RESPIRATORY_TRACT | Status: DC | PRN

## 2020-03-30 MED ORDER — SODIUM CHLORIDE 0.9 % IV SOLN
1.0000 g | Freq: Once | INTRAVENOUS | Status: AC
  Administered 2020-03-30: 1 g via INTRAVENOUS
  Filled 2020-03-30: qty 10

## 2020-03-30 MED ORDER — VENLAFAXINE HCL ER 150 MG PO CP24
150.0000 mg | ORAL_CAPSULE | Freq: Every day | ORAL | Status: DC
  Administered 2020-03-30 – 2020-04-01 (×2): 150 mg via ORAL
  Filled 2020-03-30 (×4): qty 1

## 2020-03-30 MED ORDER — ONDANSETRON HCL 4 MG PO TABS: 4.0000 mg | ORAL_TABLET | Freq: Four times a day (QID) | ORAL | Status: DC | PRN

## 2020-03-30 NOTE — ED Notes (Addendum)
Unsuccessful attempt at IV access x 3 by this RN. IV team consult placed

## 2020-03-30 NOTE — Consult Note (Signed)
Pharmacy Antibiotic Note  [name] is a 44 y.o. female admitted on 03/30/2020 with osteomyelitis.  Pharmacy has been consulted for vancomycin dosing.  Plan: Patient received vancomycin 2000mg  loading dose in ED.  Will order vancomycin 750mg  IV every 12 hours.  Pharmacy will monitor renal function and adjust dose as needed.   Height: 5\' 9"  (175.3 cm) Weight: 88.5 kg (195 lb) IBW/kg (Calculated) : 66.2  Temp (24hrs), Avg:98.8 F (37.1 C), Min:98.3 F (36.8 C), Max:99 F (37.2 C)  Recent Labs  Lab 03/30/20 1247 03/30/20 1740  WBC 7.7  --   CREATININE 1.62*  --   LATICACIDVEN 2.6* 1.9    Estimated Creatinine Clearance: 52.5 mL/min (A) (by C-G formula based on SCr of 1.62 mg/dL (H)).    Allergies  Allergen Reactions  . Albumin (Human)     unknown  . Erythromycin Rash  . Midecamycin Rash    Antimicrobials this admission: 6/7 vancomycin  >>  6/7 ceftriaxone  >>   Microbiology results: 6/7 BCx: pending   Thank you for allowing pharmacy to be a part of this patient's care.  Pernell Dupre, PharmD, BCPS Clinical Pharmacist 03/30/2020 9:35 PM

## 2020-03-30 NOTE — ED Triage Notes (Signed)
Patient reports having surgery to partially amputate toes on left foot. Patient states since her surgery, she has noticed worsening infection to surgical site and wound on bottom . Necrotic tissue and purulent drainage noted from wound on bottom of foot. Patient states she has had issues with every procedure she has had in the past.

## 2020-03-30 NOTE — ED Notes (Signed)
1A RN Insurance risk surveyor called this RN regarding report and had no further questions at this time regarding pt's admission. Pt set to transfer to room 133
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
## 2020-03-30 NOTE — ED Notes (Signed)
Skylar RN from 1A called this requesting report and informed this RN that she had not read the ED IP report yet. This RN asked accepting RN to please read over the ED IP Handoff and give me a call with any questions before the 15 minute mark. RN verbalized understanding/,

## 2020-03-30 NOTE — ED Notes (Signed)
Pt accidentally pulled IV out, new IV team consult placed/ Accepting RN Vickii Chafe informed and notified of such.

## 2020-03-30 NOTE — ED Notes (Signed)
Attempted to call report at this time, per [facility] charge, the pt was not assigned to a nurse yet. Emily notified this RN that she would assign a nurse and have them give me a call back before the 15 minute mark.   RN Helene Kelp verbalized understanding and had no further questions for me

## 2020-03-30 NOTE — ED Notes (Signed)
IV team at bedside 

## 2020-03-30 NOTE — Consult Note (Addendum)
PODIATRY / FOOT AND ANKLE SURGERY CONSULTATION NOTE  Requesting Physician: Derrell Lolling, MD  Reason for consult: Left forefoot infection  Chief Complaint: Left forefoot infection   HPI: [name] is a 44 y.o. female who presents with worsening ulceration to the left forefoot plantarly.  A couple weeks ago patient suffered a inversion type ankle sprain after a fall and was taken to the emergency room where it was showed that she had a nondisplaced Danis Weber type B fibular fracture.  Patient was placed in a posterior splint and then sent to clinic for further evaluation.  Patient states that she was putting more weight on her left foot due to not being able to put weight on her right foot and believes that the ulceration/blister on the left side started due to that.  Patient's blisters/ulcerated tissue was deroofed and debrided during her last visit and a culture was taken which grew MRSA and the patient was placed on Bactrim.  Patient called the office today stating that the redness and swelling has gotten better but she had noticed some black changes to the wounds and was concerned for necrosis/further infection.  At that time patient was told to go to the emergency room for further evaluation and care.  Patient still is in a cast at this time to the right side.  She seems to be tolerating that well.  Patient denies any nausea, vomiting, fever, chills.  Patient would like to proceed with transmetatarsal amputation of the left side that was discussed previously at several visits.  Patient states that she has remained nonweightbearing to the right side at all times but does relate putting some pressure on the plantar aspect of the cast.  PMHx:  Past Medical History:  Diagnosis Date  . Bipolar disorder ([place])   . Chronic combined systolic and diastolic CHF (congestive heart failure) ([place])    a. ECHO 04/23/14 LV EF: 45% - 50%,  mild LVH, diffuse hypokinesis. LV diastolic dysfunction, high ventricular  filling pressure. Mild LA dilation.  . Cocaine abuse ([place])   . COPD (chronic obstructive pulmonary disease) ([place])   . Diabetes mellitus without complication ([place])   . GERD (gastroesophageal reflux disease)   . Heroin overdose ([place]) 10/2018  . Hypertension   . MRSA infection    arm wound  . Nicotine dependence with current use   . Seizures ([place])    last seizure March 2021; usual 1-2 per month    Surgical Hx:  Past Surgical History:  Procedure Laterality Date  . ABDOMINAL HYSTERECTOMY    . AMPUTATION TOE Left 03/23/2019   Procedure: AMPUTATION TOE GREAT LEFT TOE;  Surgeon: [name] Patricia, DPM;  Location: [facility];  Service: Podiatry;  Laterality: Left;  . AMPUTATION TOE Left 08/16/2019   Procedure: AMPUTATION TOE 5th;  Surgeon: Caroline More, DPM;  Location: [facility];  Service: Podiatry;  Laterality: Left;  . AMPUTATION TOE Left 02/05/2020   Procedure: AMPUTATION MPJ LEFT 4TH;  Surgeon: Caroline More, DPM;  Location: [facility];  Service: Podiatry;  Laterality: Left;  . ANKLE FRACTURE SURGERY Left    pins/screws in place  . CHOLECYSTECTOMY  2005  . FRACTURE SURGERY    . LOWER EXTREMITY ANGIOGRAPHY Left 03/12/2019   Procedure: LOWER EXTREMITY ANGIOGRAPHY;  Surgeon: Katha Cabal, MD;  Location: [facility];  Service: Cardiovascular;  Laterality: Left;  . TEE WITHOUT CARDIOVERSION N/A 03/26/2019   Procedure: TRANSESOPHAGEAL ECHOCARDIOGRAM (TEE);  Surgeon: Nelva Bush, MD;  Location: [facility];  Service:  Cardiovascular;  Laterality: N/A;  . TUBAL LIGATION      FHx:  Family History  Problem Relation Age of Onset  . Hypertension Other   . Drug abuse Mother   . Seizures Neg Hx     Social History:  reports that she has been smoking cigarettes. She has a 28.00 pack-year smoking history. She has never used smokeless tobacco. She reports previous alcohol use. She reports previous drug use.  Allergies:  Allergies  Allergen Reactions  . Albumin (Human)     unknown  .  Erythromycin Rash  . Midecamycin Rash    Review of Systems: General ROS: negative Respiratory ROS: no cough, shortness of breath, or wheezing Cardiovascular ROS: no chest pain or dyspnea on exertion Gastrointestinal ROS: no abdominal pain, change in bowel habits, or black or bloody stools Musculoskeletal ROS: positive for - joint swelling Neurological ROS: positive for - numbness/tingling Dermatological ROS: positive for Left foot redness/swelling, necrotic ulceration forefoot  (Not in a hospital admission)   Physical Exam: General: Alert and oriented.  No apparent distress.  Vascular: DP/PT pulses palpable bilateral.  Capillary fill time intact to the forefoot bilateral.  No hair present to both feet.  Neuro: Light touch sensation absent to bilateral lower extremities.  Derm: Fibronecrotic ulceration present to the plantar left second/third metatarsal phalangeal joint areas with extension to third digits.  Area appears to be fairly macerated overall.  No open deep ulceration is present but the necrotic tissue that is present is unstageable and unsure of how far the depth is to that tissue.     MSK: Left hallux, fifth toe, partial fourth toe amputations.  Limited ankle joint dorsiflexion with the knee extended but improved with knee flexion bilateral.  Right foot and ankle in a cast which appears to be fairly dirty on the bottom side with small cracking on the plantar aspect of the cast indicating weightbearing.  Results for orders placed or performed during the hospital encounter of 03/30/20 (from the past 48 hour(s))  Glucose, capillary     Status: Abnormal   Collection Time: 03/30/20 12:46 PM  Result Value Ref Range   Glucose-Capillary 211 (H) 70 - 99 mg/dL    Comment: Glucose reference range applies only to samples taken after fasting for at least 8 hours.  Comprehensive metabolic panel     Status: Abnormal   Collection Time: 03/30/20 12:47 PM  Result Value Ref Range    Sodium 135 135 - 145 mmol/L   Potassium 3.5 3.5 - 5.1 mmol/L   Chloride 100 98 - 111 mmol/L   CO2 26 22 - 32 mmol/L   Glucose, Bld 236 (H) 70 - 99 mg/dL    Comment: Glucose reference range applies only to samples taken after fasting for at least 8 hours.   BUN 15 6 - 20 mg/dL   Creatinine, Ser 1.62 (H) 0.44 - 1.00 mg/dL   Calcium 8.8 (L) 8.9 - 10.3 mg/dL   Total Protein 7.8 6.5 - 8.1 g/dL   Albumin 3.2 (L) 3.5 - 5.0 g/dL   AST 19 15 - 41 U/L   ALT 23 0 - 44 U/L   Alkaline Phosphatase 88 38 - 126 U/L   Total Bilirubin 0.6 0.3 - 1.2 mg/dL   GFR calc non Af Amer 38 (L) >60 mL/min   GFR calc Af Amer 44 (L) >60 mL/min   Anion gap 9 5 - 15    Comment: Performed at [facility], [address] [place],  [place] [address]  Lactic acid, plasma     Status: Abnormal   Collection Time: 03/30/20 12:47 PM  Result Value Ref Range   Lactic Acid, Venous 2.6 (HH) 0.5 - 1.9 mmol/L    Comment: CRITICAL RESULT CALLED TO, READ BACK BY AND VERIFIED WITH Gwynn Burly RN AT 1318 ON 03/30/20 SNG Performed at [facility], [address] [place], [place] [address]   CBC with Differential     Status: Abnormal   Collection Time: 03/30/20 12:47 PM  Result Value Ref Range   WBC 7.7 4.0 - 10.5 K/uL   RBC 4.01 3.87 - 5.11 MIL/uL   Hemoglobin 11.6 (L) 12.0 - 15.0 g/dL   HCT 34.9 (L) 36.0 - 46.0 %   MCV 87.0 80.0 - 100.0 fL   MCH 28.9 26.0 - 34.0 pg   MCHC 33.2 30.0 - 36.0 g/dL   RDW 13.9 11.5 - 15.5 %   Platelets 475 (H) 150 - 400 K/uL   nRBC 0.0 0.0 - 0.2 %   Neutrophils Relative % 78 %   Neutro Abs 5.9 1.7 - 7.7 K/uL   Lymphocytes Relative 15 %   Lymphs Abs 1.1 0.7 - 4.0 K/uL   Monocytes Relative 5 %   Monocytes Absolute 0.4 0.1 - 1.0 K/uL   Eosinophils Relative 2 %   Eosinophils Absolute 0.2 0.0 - 0.5 K/uL   Basophils Relative 0 %   Basophils Absolute 0.0 0.0 - 0.1 K/uL   Immature Granulocytes 0 %   Abs Immature Granulocytes 0.03 0.00 - 0.07 K/uL    Comment: Performed at [facility], [address] [place], [place] [address]  Protime-INR     Status: None   Collection Time: 03/30/20 12:47 PM  Result Value Ref Range   Prothrombin Time 13.7 11.4 - 15.2 seconds   INR 1.1 0.8 - 1.2    Comment: (NOTE) INR goal varies based on device and disease states. Performed at [facility], [address] [place], [place] [address]   Lactic acid, plasma     Status: None   Collection Time: 03/30/20  5:40 PM  Result Value Ref Range   Lactic Acid, Venous 1.9 0.5 - 1.9 mmol/L    Comment: Performed at [facility], [address] [place], [place] [address]  Sedimentation rate     Status: Abnormal   Collection Time: 03/30/20  5:40 PM  Result Value Ref Range   Sed Rate 73 (H) 0 - 20 mm/hr    Comment: Performed at [facility], [address] [place], [place] [address]  SARS Coronavirus 2 by RT PCR (hospital order, performed in [facility] hospital lab) Nasopharyngeal Nasopharyngeal Swab     Status: None   Collection Time: 03/30/20  5:41 PM   Specimen: Nasopharyngeal Swab  Result Value Ref Range   SARS Coronavirus 2 NEGATIVE NEGATIVE    Comment: (NOTE) SARS-CoV-2 target nucleic acids are NOT DETECTED. The SARS-CoV-2 RNA is generally detectable in upper and lower respiratory specimens during the acute phase of infection. The lowest concentration of SARS-CoV-2 viral copies this assay can detect is 250 copies / mL. A negative result does not preclude SARS-CoV-2 infection and should not be used as the sole basis for treatment or other patient management decisions.  A negative result may occur with improper specimen collection / handling, submission of specimen other than nasopharyngeal swab, presence of viral mutation(s) within the areas targeted by this assay, and inadequate number of viral copies (<250  copies / mL). A negative result must be combined with clinical observations, patient history, and epidemiological information. Fact  Sheet for Patients:   StrictlyIdeas.no Fact Sheet for Healthcare Providers: BankingDealers.co.za This test is not yet approved or cleared  by the [place] FDA and has been authorized for detection and/or diagnosis of SARS-CoV-2 by FDA under an Emergency Use Authorization (EUA).  This EUA will remain in effect (meaning this test can be used) for the duration of the COVID-19 declaration under Section 564(b)(1) of the Act, 21 U.S.C. section 360bbb-3(b)(1), unless the authorization is terminated or revoked sooner. Performed at [facility], [address] [place], [place] [address]    DG Foot Complete Left  Result Date: 03/30/2020 CLINICAL DATA:  Infection along the surgical site fall left foot amputation. EXAM: LEFT FOOT - COMPLETE 3+ VIEW COMPARISON:  01/25/2020 FINDINGS: There has been a previous amputation of the fifth and first toes, as well as the distal first metatarsal and the fourth toe from distal aspect the proximal phalanx. Second toe proximal phalanx is dislocated dorsal to the second metatarsal head. There is bone resorption along plantar and medial aspect of the base of the second toe proximal phalanx. There is a smaller more subtle area of resorption along the plantar base of the proximal phalanx of the third toe. The amputated margins of the distal proximal phalanx of the fourth toe and first metatarsal are well-defined. There is forefoot soft tissue swelling.  No soft tissue air. IMPRESSION: 1. Findings consistent with osteomyelitis of the plantar base of the proximal phalanx of the second toe and possibly of the plantar base of the proximal phalanx of the third toe. No other evidence of osteomyelitis. 2. Chronic dislocation of the proximal phalanx of the second toe dorsal to the second metatarsal head. 3. Previous amputations as described stable from the prior study. 4. Diffuse forefoot soft tissue swelling.  No soft tissue  air. Electronically Signed   By: Lajean Manes M.D.   On: 03/30/2020 13:34    Blood pressure 125/81, pulse (!) 126, temperature 99 F (37.2 C), temperature source Oral, resp. rate 20, height 5\' 9"  (1.753 m), weight 88.5 kg, SpO2 99 %.   Assessment 1. Left foot osteomyelitis second and third metatarsal phalangeal joint levels 2. Cellulitis left foot 3. Diabetes type 2 uncontrolled 4. PVD 5. Right ankle fracture, lateral malleolus, nondisplaced 6. Equinus bilateral  Plan -Patient seen and examined. -X-rays reviewed and discussed with patient in detail.  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 -Discussed all treatment options with the patient both conservative and surgical attempts at correction including potential risks and complications of surgical invention.  At this time patient is elected for surgical procedure consisting of left foot transmetatarsal amputation with tendo Achilles lengthening.  Discussed postoperative course in detail and need for patient to remain nonweightbearing on the left side for approximately 2 to 4 weeks depending on incision healing. -Patient's stay is complicated due to the fact that she has a right ankle fracture as well in which she is post also remain nonweightbearing for. -Patient can still remain partial weightbearing to the left foot in a surgical shoe until the procedure but after the procedure she will have to remain nonweightbearing to both feet. -Would like to discontinue blood thinner medication 24 to 48 hours prior to procedure. -We will plan on performing procedure tomorrow on 03/31/2020 sometime  after 5 PM.  Patient will be placed n.p.o. at 8 in the morning tomorrow for surgical procedure. -Dressing changes ordered as well as culture. -Appreciate medicine recommendations for IV antibiotics.  Amputation will  likely be definitive treatment and patient can go home on oral antibiotics. -Patient had previous CT angiogram about a year ago which revealed open vessels to the foot.  Patient subsequently had an amputation after that time and healed it at the left fourth toe.  If you run into wound healing complications in the future we will send back to vascular for further examination.  Believe patient currently has no blood flow to heal the amputation at this time.  Caroline More, DPM 03/30/2020, 7:30 PM

## 2020-03-30 NOTE — H&P (Signed)
History and Physical   [facility] [place] @ [place] Admission History and Physical [employer], D.O.    Patient Name: [name] MR#: [id number] Date of Birth: 1976-07-07 Date of Admission: 03/30/2020  Referring MD/NP/PA: Dr. Joan Mayans Primary Care Physician: Care, [facility] Primary  Chief Complaint:  Chief Complaint  Patient presents with  . Wound Infection    HPI: [name] is a 44 y.o. 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  She recently had an injury of her right lower extremity which is now casted and therefore requires her to bear more weight on her left side.  She has been undergoing wound care and has a treatment for osteomyelitis of the left foot however she has developed worsening wound at the base of the left foot and left third toe which has been worsening over the past 2 days.  She has not had any fevers, chills, nausea or vomiting or change in pain or sensation in the lower extremities.  She did see Dr. Luana Shu in the podiatry clinic and was started on Bactrim which she has been taking.  Patient denies fevers/chills, weakness, dizziness, chest pain, shortness of breath, N/V/C/D, abdominal pain, dysuria/frequency, changes in mental status.    Otherwise there has been no change in status. Patient has been taking medication as prescribed and there has been no recent change in medication or diet.  No recent antibiotics.  There has been no recent illness, hospitalizations, travel or sick contacts.    EMS/ED Course: Patient received Rocephin, Vanco. Medical admission  has been requested for further management of osteomyelitis of the left second and third digits of the toes.  Podiatrist Dr. Luana Shu plans for surgical revision.  Review of Systems:  CONSTITUTIONAL: No fever/chills, fatigue, weakness, weight gain/loss, headache. EYES: No blurry or double vision. ENT: No tinnitus, postnasal drip, redness or soreness of the oropharynx. RESPIRATORY: No cough, dyspnea, wheeze.  No hemoptysis.  CARDIOVASCULAR: No chest pain, palpitations, syncope, orthopnea. No lower extremity edema.  GASTROINTESTINAL: No nausea, vomiting, abdominal pain, diarrhea, constipation.  No hematemesis, melena or hematochezia. GENITOURINARY: No dysuria, frequency, hematuria. ENDOCRINE: No polyuria or nocturia. No heat or cold intolerance. HEMATOLOGY: No anemia, bruising, bleeding. INTEGUMENTARY: Positive worsening wound infection of the left toes as per HPI no rashes, ulcers, lesions. MUSCULOSKELETAL: No arthritis, gout, dyspnea. NEUROLOGIC: No numbness, tingling, ataxia, seizure-type activity, weakness. PSYCHIATRIC: No anxiety, depression, insomnia.   Past Medical History:  Diagnosis Date  . Bipolar disorder ([place])   . Chronic combined systolic and diastolic CHF (congestive heart failure) ([place])    a. ECHO 04/23/14 LV EF: 45% - 50%,  mild LVH, diffuse hypokinesis. LV diastolic dysfunction, high ventricular filling pressure. Mild LA dilation.  . Cocaine abuse ([place])   . COPD (chronic obstructive pulmonary disease) ([place])   . Diabetes mellitus without complication ([place])   . GERD (gastroesophageal reflux disease)   . Heroin overdose ([place]) 10/2018  . Hypertension   . MRSA infection    arm wound  . Nicotine dependence with current use   . Seizures ([place])    last seizure March 2021; usual 1-2 per month  Past Surgical History:  Procedure Laterality Date  . ABDOMINAL HYSTERECTOMY    . AMPUTATION TOE Left 03/23/2019   Procedure: AMPUTATION TOE GREAT LEFT TOE;  Surgeon: [name] Patricia,  DPM;  Location: [facility] ORS;  Service: Podiatry;  Laterality: Left;  . AMPUTATION TOE Left 08/16/2019   Procedure: AMPUTATION TOE 5th;  Surgeon: Caroline More, DPM;  Location: [facility] ORS;  Service: Podiatry;  Laterality: Left;  . AMPUTATION TOE Left 02/05/2020   Procedure: AMPUTATION MPJ LEFT 4TH;  Surgeon: Caroline More, DPM;  Location: [facility] OR[facility];  Service: Podiatry;  Laterality: Left;  . ANKLE FRACTURE SURGERY Left    pins/screws in place  . CHOLECYSTECTOMY  2005  . FRACTURE SURGERY    . LOWER EXTREMITY ANGIOGRAPHY Left 03/12/2019   Procedure: LOWER EXTREMITY ANGIOGRAPHY;  Surgeon: Katha Cabal, MD;  Location: [facility];  Service: Cardiovascular;  Laterality: Left;  . TEE WITHOUT CARDIOVERSION N/A 03/26/2019   Procedure: TRANSESOPHAGEAL ECHOCARDIOGRAM (TEE);  Surgeon: Nelva Bush, MD;  Location: [facility] OR[facility];  Service: Cardiovascular;  Laterality: N/A;  . TUBAL LIGATION       reports that she has been smoking cigarettes. She has a 28.00 pack-year smoking history. She has never used smokeless tobacco. She reports previous alcohol use. She reports previous drug use.  Allergies  Allergen Reactions  . Albumin (Human)     unknown  . Erythromycin Rash  . Midecamycin Rash    Family History  Problem Relation Age of Onset  . Hypertension Other   . Drug abuse Mother   . Seizures Neg Hx     Prior to Admission medications   Medication Sig Start Date End Date Taking? Authorizing Provider  amitriptyline (ELAVIL) 150 MG tablet Take 150 mg by mouth at bedtime.   Yes [provider]  aspirin EC 81 MG tablet Take 81 mg by mouth daily.    [provider]  atorvastatin (LIPITOR) 20 MG tablet Take 20 mg by mouth at bedtime.     [provider]  bethanechol (URECHOLINE) 25 MG tablet Take 50 mg by mouth 3 (three) times daily. 04/30/19   [provider]  furosemide (LASIX) 20 MG tablet Take 20 mg by mouth 2 (two) times a day.    [provider]   glipiZIDE (GLUCOTROL) 10 MG tablet Take 1 tablet by mouth every morning. 06/05/18   [provider]  hydrOXYzine (ATARAX/VISTARIL) 50 MG tablet Take 50 mg by mouth 2 (two) times a day.  08/21/18   [provider]  insulin aspart (NOVOLOG) 100 UNIT/ML injection Inject 4-14 Units into the skin 3 (three) times daily as needed for high blood sugar.    [provider]  insulin glargine (LANTUS) 100 UNIT/ML injection Inject 40 Units into the skin 2 (two) times a day.    [provider]  metoprolol tartrate (LOPRESSOR) 50 MG tablet Take 25 mg by mouth at bedtime.     [provider]  pantoprazole (PROTONIX) 20 MG tablet Take 20 mg by mouth daily.  08/21/18   [provider]  prazosin (MINIPRESS) 5 MG capsule Take 5 mg by mouth at bedtime.    [provider]  promethazine (PHENERGAN) 12.5 MG tablet Take 12.5 mg by mouth 2 (two) times daily as needed for nausea or vomiting.  08/21/18   [provider]  QUEtiapine (SEROQUEL) 400 MG tablet Take 800 mg by mouth at bedtime.    [provider]  tamsulosin (FLOMAX) 0.4 MG CAPS capsule TAKE 1 CAPSULE  PO BID 30 MINUTES AFTER A MEAL 04/30/19   [provider]  venlafaxine XR (EFFEXOR-XR) 150 MG 24 hr capsule Take 300 mg by mouth at bedtime.  09/14/18   [provider]    Physical Exam: Vitals:   03/30/20 1231 03/30/20 1232  BP: 125/81   Pulse: (!) 126   Resp: 20   Temp: 99 F (37.2 C)   TempSrc: Oral   SpO2: 99%   Weight:  88.5 kg  Height:  5\' 9"  (1.753 m)    GENERAL: 44 y.o.-year-old white female patient, well-developed, well-nourished lying in the bed in no acute distress.  Pleasant and cooperative.   HEENT: Head atraumatic, normocephalic. Pupils equal. Mucus membranes moist. NECK: Supple. No JVD. CHEST: Normal breath sounds bilaterally. No wheezing, rales, rhonchi or crackles. No use of accessory muscles of respiration.  No reproducible chest wall  tenderness.  CARDIOVASCULAR: S1, S2 normal. No murmurs, rubs, or gallops. Cap refill <2 seconds. Pulses intact distally.  ABDOMEN: Soft, nondistended, nontender. No rebound, guarding, rigidity. Normoactive bowel sounds present in all four quadrants.  EXTREMITIES: No pedal edema, cyanosis, or clubbing. No calf tenderness or Homan's sign.  NEUROLOGIC: The patient is alert and oriented x 3. Cranial nerves II through XII are grossly intact with no focal sensorimotor deficit. PSYCHIATRIC:  Normal affect, mood, thought content. SKIN: Warm, dry, and intact without obvious rash, lesion, or ulcer.    Labs on Admission:  CBC: Recent Labs  Lab 03/30/20 1247  WBC 7.7  NEUTROABS 5.9  HGB 11.6*  HCT 34.9*  MCV 87.0  PLT 932*   Basic Metabolic Panel: Recent Labs  Lab 03/30/20 1247  NA 135  K 3.5  CL 100  CO2 26  GLUCOSE 236*  BUN 15  CREATININE 1.62*  CALCIUM 8.8*   GFR: Estimated Creatinine Clearance: 52.5 mL/min (A) (by C-G formula based on SCr of 1.62 mg/dL (H)). Liver Function Tests: Recent Labs  Lab 03/30/20 1247  AST 19  ALT 23  ALKPHOS 88  BILITOT 0.6  PROT 7.8  ALBUMIN 3.2*   No results for input(s): LIPASE, AMYLASE in the last 168 hours. No results for input(s): AMMONIA in the last 168 hours. Coagulation Profile: Recent Labs  Lab 03/30/20 1247  INR 1.1   Cardiac Enzymes: No results for input(s): CKTOTAL, CKMB, CKMBINDEX, TROPONINI in the last 168 hours. BNP (last 3 results) No results for input(s): PROBNP in the last 8760 hours. HbA1C: No results for input(s): HGBA1C in the last 72 hours. CBG: Recent Labs  Lab 03/30/20 1246  GLUCAP 211*   Lipid Profile: No results for input(s): CHOL, HDL, LDLCALC, TRIG, CHOLHDL, LDLDIRECT in the last 72 hours. Thyroid Function Tests: No results for input(s): TSH, T4TOTAL, FREET4, T3FREE, THYROIDAB in the last 72 hours. Anemia Panel: No results for input(s): VITAMINB12, FOLATE, FERRITIN, TIBC, IRON, RETICCTPCT in the  last 72 hours. Urine analysis:    Component Value Date/Time   COLORURINE YELLOW (A) 08/15/2019 1931   APPEARANCEUR CLEAR (A) 08/15/2019 1931   LABSPEC 1.015 08/15/2019 1931   PHURINE 5.0 08/15/2019 1931   GLUCOSEU >=500 (A) 08/15/2019 1931   HGBUR NEGATIVE 08/15/2019 1931   BILIRUBINUR NEGATIVE 08/15/2019 1931   KETONESUR NEGATIVE 08/15/2019 1931   PROTEINUR NEGATIVE 08/15/2019 1931   UROBILINOGEN 0.2 04/29/2014 0001   NITRITE NEGATIVE 08/15/2019 1931   LEUKOCYTESUR TRACE (A) 08/15/2019 1931   Sepsis Labs: @LABRCNTIP (procalcitonin:4,lacticidven:4) ) Recent Results (from the past 240 hour(s))  SARS Coronavirus 2 by RT PCR (hospital order, performed  in [place] lab) Nasopharyngeal Nasopharyngeal Swab     Status: None   Collection Time: 03/30/20  5:41 PM   Specimen: Nasopharyngeal Swab  Result Value Ref Range Status   SARS Coronavirus 2 NEGATIVE NEGATIVE Final    Comment: (NOTE) SARS-CoV-2 target nucleic acids are NOT DETECTED. The SARS-CoV-2 RNA is generally detectable in upper and lower respiratory specimens during the acute phase of infection. The lowest concentration of SARS-CoV-2 viral copies this assay can detect is 250 copies / mL. A negative result does not preclude SARS-CoV-2 infection and should not be used as the sole basis for treatment or other patient management decisions.  A negative result may occur with improper specimen collection / handling, submission of specimen other than nasopharyngeal swab, presence of viral mutation(s) within the areas targeted by this assay, and inadequate number of viral copies (<250 copies / mL). A negative result must be combined with clinical observations, patient history, and epidemiological information. Fact Sheet for Patients:   StrictlyIdeas.no Fact Sheet for Healthcare Providers: BankingDealers.co.za This test is not yet approved or cleared  by the [place] FDA  and has been authorized for detection and/or diagnosis of SARS-CoV-2 by FDA under an Emergency Use Authorization (EUA).  This EUA will remain in effect (meaning this test can be used) for the duration of the COVID-19 declaration under Section 564(b)(1) of the Act, 21 U.S.C. section 360bbb-3(b)(1), unless the authorization is terminated or revoked sooner. Performed at [facility], [address] [place], [place] [address]      Radiological Exams on Admission: DG Foot Complete Left  Result Date: 03/30/2020 CLINICAL DATA:  Infection along the surgical site fall left foot amputation. EXAM: LEFT FOOT - COMPLETE 3+ VIEW COMPARISON:  01/25/2020 FINDINGS: There has been a previous amputation of the fifth and first toes, as well as the distal first metatarsal and the fourth toe from distal aspect the proximal phalanx. Second toe proximal phalanx is dislocated dorsal to the second metatarsal head. There is bone resorption along plantar and medial aspect of the base of the second toe proximal phalanx. There is a smaller more subtle area of resorption along the plantar base of the proximal phalanx of the third toe. The amputated margins of the distal proximal phalanx of the fourth toe and first metatarsal are well-defined. There is forefoot soft tissue swelling.  No soft tissue air. IMPRESSION: 1. Findings consistent with osteomyelitis of the plantar base of the proximal phalanx of the second toe and possibly of the plantar base of the proximal phalanx of the third toe. No other evidence of osteomyelitis. 2. Chronic dislocation of the proximal phalanx of the second toe dorsal to the second metatarsal head. 3. Previous amputations as described stable from the prior study. 4. Diffuse forefoot soft tissue swelling.  No soft tissue air. Electronically Signed   By: Lajean Manes M.D.   On: 03/30/2020 13:34    EKG: Pending  Assessment/Plan  This is a 44 y.o. 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  Osteomyelitis and cellulitis of the left second and third digits of the toes -Admit inpatient -IV Rocephin and Vanco -Pain control - Preop evaluation with surgical management per podiatrist Dr. Luana Shu -Check EKG and echo  #. H/o Diabetes - Accuchecks achs with RISS coverage - Heart healthy, carb controlled diet -Hold glipizide  #.  History of CHF, hypertension  Continue Lasix, metoprolol, prazosin  #. History of hyperlipidemia - Continue atorvastatin  #. History of bipolar disorder - Continue Elavil, Atarax, Seroquel, Effexor  #. History of GERD - Continue Protonix  #. History of seizure disorder - Seizure precautions  #. History of COPD - Continue O2, nebs PRN  Admission status: IP IV Fluids: HL Diet/Nutrition: Heart healthy, carb controlled, NPO after midnight Consults called: Podiatry, Dr. Luana Shu called by EDP  DVT Px: Heparin and early ambulation. Code Status: Full Code  Disposition Plan: To home be determined  All the records are reviewed and case discussed with ED provider. Management plans discussed with the patient and/or family who express understanding and agree with plan of care.  [employer] D.O. on 03/30/2020 at 7:12 PM CC: Primary care physician; Care, [facility]   03/30/2020, 7:12 PM

## 2020-03-30 NOTE — ED Triage Notes (Signed)
First Nurse Note:  Arrives to evaluate left foot for possible infection.  Has had great toe and second toe amputated.  AAOx3.  Skin warm and dry. NAD

## 2020-03-30 NOTE — ED Notes (Signed)
Pt to room via wheelchair and required 2 person assist to transfer from chair to bed. Pt reports foul smelling odor to left foot where she recently had her toe amputated. Pt's toe and foot area is black with drainage and foul odor.

## 2020-03-31 ENCOUNTER — Inpatient Hospital Stay: Payer: Medicaid Other | Admitting: Anesthesiology

## 2020-03-31 ENCOUNTER — Encounter
Admission: EM | Disposition: A | Discharge status: Home / Self Care | Payer: Self-pay | Source: Home / Self Care | Attending: Internal Medicine

## 2020-03-31 ENCOUNTER — Inpatient Hospital Stay (HOSPITAL_COMMUNITY)
Admit: 2020-03-31 | Discharge: 2020-03-31 | Disposition: A | Discharge status: Home / Self Care | Payer: Medicaid Other | Attending: Family Medicine | Admitting: Family Medicine

## 2020-03-31 ENCOUNTER — Inpatient Hospital Stay: Payer: Medicaid Other

## 2020-03-31 DIAGNOSIS — I342 Nonrheumatic mitral (valve) stenosis: Secondary | ICD-10-CM

## 2020-03-31 DIAGNOSIS — I5042 Chronic combined systolic (congestive) and diastolic (congestive) heart failure: Secondary | ICD-10-CM

## 2020-03-31 DIAGNOSIS — I340 Nonrheumatic mitral (valve) insufficiency: Secondary | ICD-10-CM

## 2020-03-31 DIAGNOSIS — E1165 Type 2 diabetes mellitus with hyperglycemia: Secondary | ICD-10-CM

## 2020-03-31 HISTORY — PX: ACHILLES TENDON SURGERY: SHX542

## 2020-03-31 HISTORY — PX: TRANSMETATARSAL AMPUTATION: SHX6197

## 2020-03-31 LAB — URINE DRUG SCREEN, QUALITATIVE (ARMC ONLY)
Amphetamines, Ur Screen: NOT DETECTED
Barbiturates, Ur Screen: NOT DETECTED
Benzodiazepine, Ur Scrn: NOT DETECTED
Cannabinoid 50 Ng, Ur ~~LOC~~: NOT DETECTED
Cocaine Metabolite,Ur ~~LOC~~: NOT DETECTED
MDMA (Ecstasy)Ur Screen: NOT DETECTED
Methadone Scn, Ur: NOT DETECTED
Opiate, Ur Screen: NOT DETECTED
Phencyclidine (PCP) Ur S: NOT DETECTED
Tricyclic, Ur Screen: POSITIVE — AB

## 2020-03-31 LAB — GLUCOSE, CAPILLARY
Glucose-Capillary: 155 mg/dL — ABNORMAL HIGH (ref 70–99)
Glucose-Capillary: 160 mg/dL — ABNORMAL HIGH (ref 70–99)
Glucose-Capillary: 162 mg/dL — ABNORMAL HIGH (ref 70–99)
Glucose-Capillary: 172 mg/dL — ABNORMAL HIGH (ref 70–99)
Glucose-Capillary: 175 mg/dL — ABNORMAL HIGH (ref 70–99)
Glucose-Capillary: 197 mg/dL — ABNORMAL HIGH (ref 70–99)
Glucose-Capillary: 208 mg/dL — ABNORMAL HIGH (ref 70–99)

## 2020-03-31 LAB — BASIC METABOLIC PANEL
Anion gap: 8 (ref 5–15)
BUN: 15 mg/dL (ref 6–20)
CO2: 27 mmol/L (ref 22–32)
Calcium: 8.4 mg/dL — ABNORMAL LOW (ref 8.9–10.3)
Chloride: 103 mmol/L (ref 98–111)
Creatinine, Ser: 1.53 mg/dL — ABNORMAL HIGH (ref 0.44–1.00)
GFR calc Af Amer: 47 mL/min — ABNORMAL LOW (ref >60)
GFR calc non Af Amer: 41 mL/min — ABNORMAL LOW (ref >60)
Glucose, Bld: 178 mg/dL — ABNORMAL HIGH (ref 70–99)
Potassium: 3.6 mmol/L (ref 3.5–5.1)
Sodium: 138 mmol/L (ref 135–145)

## 2020-03-31 LAB — ECHOCARDIOGRAM COMPLETE
Height: 69 in
Weight: 3049.4 oz

## 2020-03-31 LAB — PROTIME-INR
INR: 1.1 (ref 0.8–1.2)
Prothrombin Time: 14.2 seconds (ref 11.4–15.2)

## 2020-03-31 LAB — CBC
HCT: 33.2 % — ABNORMAL LOW (ref 36.0–46.0)
Hemoglobin: 10.9 g/dL — ABNORMAL LOW (ref 12.0–15.0)
MCH: 29.1 pg (ref 26.0–34.0)
MCHC: 32.8 g/dL (ref 30.0–36.0)
MCV: 88.5 fL (ref 80.0–100.0)
Platelets: 464 10*3/uL — ABNORMAL HIGH (ref 150–400)
RBC: 3.75 MIL/uL — ABNORMAL LOW (ref 3.87–5.11)
RDW: 14 % (ref 11.5–15.5)
WBC: 7.3 10*3/uL (ref 4.0–10.5)
nRBC: 0 % (ref 0.0–0.2)

## 2020-03-31 LAB — HEMOGLOBIN A1C
Hgb A1c MFr Bld: 7.3 % — ABNORMAL HIGH (ref 4.8–5.6)
Mean Plasma Glucose: 162.81 mg/dL

## 2020-03-31 LAB — HIV ANTIBODY (ROUTINE TESTING W REFLEX): HIV Screen 4th Generation wRfx: NONREACTIVE

## 2020-03-31 SURGERY — AMPUTATION, FOOT, TRANSMETATARSAL
Anesthesia: General | Site: Leg Lower | Laterality: Left

## 2020-03-31 MED ORDER — FENTANYL CITRATE (PF) 100 MCG/2ML IJ SOLN: 25.0000 ug | INTRAMUSCULAR | Status: DC | PRN

## 2020-03-31 MED ORDER — BUPIVACAINE HCL (PF) 0.5 % IJ SOLN
INTRAMUSCULAR | Status: DC | PRN
  Administered 2020-03-31: 20 mL

## 2020-03-31 MED ORDER — SODIUM CHLORIDE 0.9 % IV SOLN
INTRAVENOUS | Status: DC | PRN
Start: 2020-03-31 — End: 2020-03-31

## 2020-03-31 MED ORDER — ONDANSETRON HCL 4 MG/2ML IJ SOLN
INTRAMUSCULAR | Status: DC | PRN
  Administered 2020-03-31: 4 mg via INTRAVENOUS

## 2020-03-31 MED ORDER — EPHEDRINE SULFATE 50 MG/ML IJ SOLN
INTRAMUSCULAR | Status: DC | PRN
  Administered 2020-03-31 (×2): 10 mg via INTRAVENOUS

## 2020-03-31 MED ORDER — LIDOCAINE HCL (CARDIAC) PF 100 MG/5ML IV SOSY
PREFILLED_SYRINGE | INTRAVENOUS | Status: DC | PRN
  Administered 2020-03-31: 60 mg via INTRAVENOUS

## 2020-03-31 MED ORDER — FENTANYL CITRATE (PF) 100 MCG/2ML IJ SOLN
INTRAMUSCULAR | Status: AC
  Filled 2020-03-31: qty 2

## 2020-03-31 MED ORDER — PROPOFOL 10 MG/ML IV BOLUS
INTRAVENOUS | Status: AC
  Filled 2020-03-31: qty 20

## 2020-03-31 MED ORDER — MIDAZOLAM HCL 2 MG/2ML IJ SOLN
INTRAMUSCULAR | Status: DC | PRN
  Administered 2020-03-31: 2 mg via INTRAVENOUS

## 2020-03-31 MED ORDER — ENSURE MAX PROTEIN PO LIQD
11.0000 [oz_av] | Freq: Two times a day (BID) | ORAL | Status: DC
  Administered 2020-04-01: 11 [oz_av] via ORAL
  Filled 2020-03-31: qty 330

## 2020-03-31 MED ORDER — PHENYLEPHRINE HCL (PRESSORS) 10 MG/ML IV SOLN
INTRAVENOUS | Status: DC | PRN
  Administered 2020-03-31 (×3): 100 ug via INTRAVENOUS

## 2020-03-31 MED ORDER — PROPOFOL 10 MG/ML IV BOLUS
INTRAVENOUS | Status: DC | PRN
  Administered 2020-03-31: 140 mg via INTRAVENOUS
  Administered 2020-03-31: 40 mg via INTRAVENOUS

## 2020-03-31 MED ORDER — ADULT MULTIVITAMIN W/MINERALS CH
1.0000 | ORAL_TABLET | Freq: Every day | ORAL | Status: DC
  Administered 2020-04-01 – 2020-04-02 (×2): 1 via ORAL
  Filled 2020-03-31 (×2): qty 1

## 2020-03-31 MED ORDER — ONDANSETRON HCL 4 MG/2ML IJ SOLN: 4.0000 mg | Freq: Once | INTRAMUSCULAR | Status: DC | PRN

## 2020-03-31 MED ORDER — FENTANYL CITRATE (PF) 100 MCG/2ML IJ SOLN
INTRAMUSCULAR | Status: DC | PRN
  Administered 2020-03-31: 25 ug via INTRAVENOUS
  Administered 2020-03-31: 50 ug via INTRAVENOUS
  Administered 2020-03-31: 25 ug via INTRAVENOUS

## 2020-03-31 MED ORDER — VASOPRESSIN 20 UNIT/ML IV SOLN
INTRAVENOUS | Status: DC | PRN
Start: 2020-03-31 — End: 2020-03-31
  Administered 2020-03-31 (×2): 1 [IU] via INTRAVENOUS

## 2020-03-31 MED ORDER — MIDAZOLAM HCL 2 MG/2ML IJ SOLN
INTRAMUSCULAR | Status: AC
  Filled 2020-03-31: qty 2

## 2020-03-31 MED ORDER — PERFLUTREN LIPID MICROSPHERE
1.0000 mL | INTRAVENOUS | Status: AC | PRN
  Administered 2020-03-31: 2 mL via INTRAVENOUS
  Filled 2020-03-31: qty 10

## 2020-03-31 MED ORDER — SODIUM CHLORIDE 0.9 % IV SOLN
INTRAVENOUS | Status: DC | PRN
  Administered 2020-03-31: 15 mL/h via INTRAVENOUS

## 2020-03-31 SURGICAL SUPPLY — 60 items
BAG COUNTER SPONGE EZ (MISCELLANEOUS) IMPLANT
BAG SPNG 4X4 CLR HAZ (MISCELLANEOUS)
BLADE OSC/SAGITTAL MD 9X18.5 (BLADE) ×3 IMPLANT
BLADE OSCILLATING/SAGITTAL (BLADE)
BLADE SURG 15 STRL LF DISP TIS (BLADE) ×4 IMPLANT
BLADE SURG 15 STRL SS (BLADE) ×9
BLADE SW THK.38XMED LNG THN (BLADE) ×2 IMPLANT
BNDG CMPR 75X41 PLY HI ABS (GAUZE/BANDAGES/DRESSINGS) ×4
BNDG COHESIVE 4X5 TAN STRL (GAUZE/BANDAGES/DRESSINGS) ×3 IMPLANT
BNDG ELASTIC 4X5.8 VLCR STR LF (GAUZE/BANDAGES/DRESSINGS) ×3 IMPLANT
BNDG ELASTIC 6X5.8 VLCR STR LF (GAUZE/BANDAGES/DRESSINGS) ×3 IMPLANT
BNDG ESMARK 4X12 TAN STRL LF (GAUZE/BANDAGES/DRESSINGS) ×3 IMPLANT
BNDG GAUZE 4.5X4.1 6PLY STRL (MISCELLANEOUS) ×4 IMPLANT
BNDG STRETCH 4X75 STRL LF (GAUZE/BANDAGES/DRESSINGS) ×4 IMPLANT
CANISTER SUCT 1200ML W/VALVE (MISCELLANEOUS) ×3 IMPLANT
COVER WAND RF STERILE (DRAPES) ×3 IMPLANT
CUFF TOURN SGL QUICK 12 (TOURNIQUET CUFF) ×2 IMPLANT
CUFF TOURN SGL QUICK 18X4 (TOURNIQUET CUFF) ×3 IMPLANT
DRAIN PENROSE 1/4X12 LTX STRL (WOUND CARE) IMPLANT
DRAPE FLUOR MINI C-ARM 54X84 (DRAPES) ×2 IMPLANT
DRSG MEPILEX FLEX 3X3 (GAUZE/BANDAGES/DRESSINGS) IMPLANT
DURAPREP 26ML APPLICATOR (WOUND CARE) ×3 IMPLANT
ELECT REM PT RETURN 9FT ADLT (ELECTROSURGICAL) ×3
ELECTRODE REM PT RTRN 9FT ADLT (ELECTROSURGICAL) ×2 IMPLANT
GAUZE SPONGE 4X4 12PLY STRL (GAUZE/BANDAGES/DRESSINGS) ×3 IMPLANT
GAUZE XEROFORM 1X8 LF (GAUZE/BANDAGES/DRESSINGS) ×6 IMPLANT
GLOVE BIO SURGEON STRL SZ7 (GLOVE) ×3 IMPLANT
GLOVE INDICATOR 7.0 STRL GRN (GLOVE) ×3 IMPLANT
GOWN STRL REUS W/ TWL LRG LVL3 (GOWN DISPOSABLE) ×4 IMPLANT
GOWN STRL REUS W/TWL LRG LVL3 (GOWN DISPOSABLE) ×6
HANDLE YANKAUER SUCT BULB TIP (MISCELLANEOUS) IMPLANT
HANDPIECE VERSAJET DEBRIDEMENT (MISCELLANEOUS) IMPLANT
KIT TURNOVER KIT A (KITS) ×3 IMPLANT
NDL HYPO 25X1 1.5 SAFETY (NEEDLE) ×4 IMPLANT
NDL SAFETY ECLIPSE 18X1.5 (NEEDLE) ×2 IMPLANT
NEEDLE HYPO 18GX1.5 SHARP (NEEDLE) ×3
NEEDLE HYPO 25X1 1.5 SAFETY (NEEDLE) ×6 IMPLANT
NS IRRIG 500ML POUR BTL (IV SOLUTION) ×3 IMPLANT
PACK EXTREMITY (MISCELLANEOUS) ×3 IMPLANT
PULSAVAC PLUS IRRIG FAN TIP (DISPOSABLE)
SOL PREP PVP 2OZ (MISCELLANEOUS) ×3
SOLUTION PREP PVP 2OZ (MISCELLANEOUS) ×2 IMPLANT
SPLINT FAST PLASTER 5X30 (CAST SUPPLIES) ×1
SPLINT PLASTER CAST FAST 5X30 (CAST SUPPLIES) IMPLANT
SPONGE LAP 18X18 RF (DISPOSABLE) ×3 IMPLANT
STAPLER SKIN PROX 35W (STAPLE) ×3 IMPLANT
STOCKINETTE M/LG 89821 (MISCELLANEOUS) ×3 IMPLANT
STRIP CLOSURE SKIN 1/4X4 (GAUZE/BANDAGES/DRESSINGS) ×2 IMPLANT
SUT ETHILON 2 0 FS 18 (SUTURE) ×4 IMPLANT
SUT ETHILON 2 0 FSLX (SUTURE) ×2 IMPLANT
SUT ETHILON 3-0 FS-10 30 BLK (SUTURE)
SUT VIC AB 2-0 CT1 27 (SUTURE)
SUT VIC AB 2-0 CT1 TAPERPNT 27 (SUTURE) ×2 IMPLANT
SUT VIC AB 2-0 SH 27 (SUTURE)
SUT VIC AB 2-0 SH 27XBRD (SUTURE) ×2 IMPLANT
SUT VIC AB 3-0 SH 27 (SUTURE) ×3
SUT VIC AB 3-0 SH 27X BRD (SUTURE) ×4 IMPLANT
SUTURE EHLN 3-0 FS-10 30 BLK (SUTURE) ×4 IMPLANT
SYR 10ML LL (SYRINGE) ×6 IMPLANT
TIP FAN IRRIG PULSAVAC PLUS (DISPOSABLE) IMPLANT

## 2020-03-31 NOTE — Op Note (Signed)
PODIATRY / FOOT AND ANKLE SURGERY OPERATIVE REPORT    SURGEON: Caroline More, DPM  PRE-OPERATIVE DIAGNOSIS: 1. L foot osteomyelitis 2 and 3 MTPJs with associated ulcerations and cellulitis 2. DM2 uncontrolled  POST-OPERATIVE DIAGNOSIS: same  PROCEDURE(S): 1. L TMA 2. L TAL  HEMOSTASIS: L high ankle tourniquet  ANESTHESIA: MAC  ESTIMATED BLOOD LOSS: 20 cc  FINDING(S): 1.  L 2nd and 3rd MTPJ osteoarthritis vs osteomyelitis 2. L 2nd and 3rd toe ulcerations/2nd and 3rd MTPJ ulcerations with necrosis  PATHOLOGY/SPECIMEN(S):  L forefoot  INDICATIONS:   [name] is a 44 y.o. female who presents with necrotic ulcerations to the L 2nd and 3rd rays.  She suffered a R ankle fracture which required cast immobilization and subsequently put more pressure on the L foot due to the NWB status of the R foot.  This increased pressure likely causing the worsening ulcerations on the L foot.  She developed subsequent ulcerations and cellulitis and was eventually sent to the ED for further case and possible definitive TMA after having multiple partial digital/ray amputations.  Pt presents today for TMA with TAL.    DESCRIPTION: After obtaining full informed written consent, the patient was brought back to the operating room and placed supine upon the operating table.  The patient received IV antibiotics prior to induction.  After obtaining adequate anesthesia an achilles block and ankle block was performed with 20cc 0.5% marcaine plain.  A high ankle tourniquet was applied. The patient was prepped and draped in the standard fashion.  An Esmarch bandage was used to exsanguinate the left lower extremity and the pneumatic ankle tourniquet was inflated.  Attention was then directed to the posterior aspect of the left heel where 3 small stab incisions were made approximately 3 cm apart along the course of the Achilles tendon with 2 being directed at the medial aspect and the central incision being  directed at the lateral aspect.  The Achilles tendon was then hemisected and lengthened.  The equinus contracture present in the preoperative state appeared to be well reduced at this time and ankle joint dorsiflexion was approximately 10 degrees with the knee held in extended position.  Skin staples were then placed at each incision line.  A wound culture was taken first at the level of the ulceration site subsecond and third metatarsal phalangeal joints and passed off.  Attention was then directed to the distal aspect the left forefoot where 2 converging semielliptical incisions were made starting from the medial aspect of the first metatarsal head and extending to the lateral aspect of the fifth metatarsal head extending up the proximal shaft areas.  This was a full-thickness incision.  The second, third, fourth metatarsal phalangeal joints were then disarticulated and passed off in the operative site.  Circumferential dissection was then performed around the remaining metatarsals 1, 2, 3, 4, 5 with Bovie dissection.  The sagittal bone saw was then used to perform the transmetatarsal amputation at the midshaft to proximal shaft level with the appropriate beveling.  The metatarsals then passed off the operative site.  No purulence was expressed during this time.  The bone and toes was then passed off for pathologic specimen.  The surgical site was flushed with copious amounts normal sterile saline.  The plantar plates were then resected and passed off the operative site as well as any tendinous debris.  The flaps were then slightly remodeled and the deep tissues and subcutaneous tissue was then reapproximated well coapted with 3-0 Vicryl and  the skin was then reapproximated well coapted with 3-0 nylon and staples.  The pneumatic ankle tourniquet was deflated and a prompt hyperemic response was noted to the flaps of the amputation site and left foot in general.  A postoperative dressing was applied consisting of  Xeroform followed by 4 x 4 gauze, Kerlix, web roll, posterior splint, Ace wrap.  Patient tolerated the procedure and anesthesia well was transferred to recovery room vital signs stable vascular status remaining intact to left foot.  The patient was discharged back to the inpatient room with the appropriate orders and is instructed to remain nonweightbearing to left lower extremity all times.  COMPLICATIONS: None  CONDITION: Good, stable  Caroline More

## 2020-03-31 NOTE — Plan of Care (Signed)
  Problem: Clinical Measurements: Goal: Ability to avoid or minimize complications of infection will improve Outcome: Progressing   Problem: Skin Integrity: Goal: Skin integrity will improve Outcome: Progressing   Problem: Education: Goal: Ability to describe self-care measures that may prevent or decrease complications (Diabetes Survival Skills Education) will improve Outcome: Progressing Goal: Individualized Educational Video(s) Outcome: Progressing   Problem: Coping: Goal: Ability to adjust to condition or change in health will improve Outcome: Progressing   Problem: Fluid Volume: Goal: Ability to maintain a balanced intake and output will improve Outcome: Progressing   Problem: Health Behavior/Discharge Planning: Goal: Ability to identify and utilize available resources and services will improve Outcome: Progressing Goal: Ability to manage health-related needs will improve Outcome: Progressing   Problem: Metabolic: Goal: Ability to maintain appropriate glucose levels will improve Outcome: Progressing   Problem: Nutritional: Goal: Maintenance of adequate nutrition will improve Outcome: Progressing Goal: Progress toward achieving an optimal weight will improve Outcome: Progressing   Problem: Skin Integrity: Goal: Risk for impaired skin integrity will decrease Outcome: Progressing   Problem: Tissue Perfusion: Goal: Adequacy of tissue perfusion will improve Outcome: Progressing   Problem: Education: Goal: Knowledge of General Education information will improve Description: Including pain rating scale, medication(s)/side effects and non-pharmacologic comfort measures Outcome: Progressing   Problem: Health Behavior/Discharge Planning: Goal: Ability to manage health-related needs will improve Outcome: Progressing   Problem: Clinical Measurements: Goal: Ability to maintain clinical measurements within normal limits will improve Outcome: Progressing Goal: Will  remain free from infection Outcome: Progressing Goal: Diagnostic test results will improve Outcome: Progressing Goal: Respiratory complications will improve Outcome: Progressing Goal: Cardiovascular complication will be avoided Outcome: Progressing   Problem: Activity: Goal: Risk for activity intolerance will decrease Outcome: Progressing   Problem: Nutrition: Goal: Adequate nutrition will be maintained Outcome: Progressing   Problem: Coping: Goal: Level of anxiety will decrease Outcome: Progressing   Problem: Elimination: Goal: Will not experience complications related to bowel motility Outcome: Progressing Goal: Will not experience complications related to urinary retention Outcome: Progressing   Problem: Pain Managment: Goal: General experience of comfort will improve Outcome: Progressing   Problem: Safety: Goal: Ability to remain free from injury will improve Outcome: Progressing   Problem: Skin Integrity: Goal: Risk for impaired skin integrity will decrease Outcome: Progressing   

## 2020-03-31 NOTE — Progress Notes (Signed)
PROGRESS NOTE    [name]  YYQ:[id number] DOB: 09-Jun-1976 DOA: 03/30/2020 PCP: [facility]   Chief complaint. Follow-up on left foot osteomyelitis.   Brief Narrative:  [name] is a 44 y.o. 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 Patient has been seen by podiatry, scheduled debridement later on 6/8. Currently covered with antibiotics with vancomycin and Rocephin.    Assessment & Plan:   Active Problems:   Osteomyelitis of left foot ([place])  #1. Osteomyelitis and cellulitis of the left second and third digits of the foot. Patient is scheduled for debridement today. Continue vancomycin and Rocephin. Blood cultures are pending. Echocardiogram is pending.  #2. Type 2 diabetes. Continue sliding scale insulin coverage.  3. Essential hypertension. Continue home medicines.  4. Chronic combined systolic and diastolic congestive heart failure. No exacerbation. No evidence of volume overload. Continue to follow.  5. COPD. No bronchospasm.  6. History of seizure disorder. Follow.  Pressure Injury 03/30/20 Heel Left Stage 1 -  Intact skin with non-blanchable redness of a localized area usually over a bony prominence. (Active)  03/30/20   Location: Heel  Location Orientation: Left  Staging: Stage 1 -  Intact skin with non-blanchable redness of a localized area usually over a bony prominence.  Wound Description (Comments):   Present on Admission: Yes     Pressure Injury 03/30/20 Toe (Comment  which one) Medial Unstageable - Full thickness tissue loss in which the base of the injury is covered by slough (yellow, tan, gray, green or brown) and/or eschar (tan, brown or black) in the wound bed. (Active)  03/30/20   Location: Toe (Comment  which one)  Location  Orientation: Medial  Staging: Unstageable - Full thickness tissue loss in which the base of the injury is covered by slough (yellow, tan, gray, green or brown) and/or eschar (tan, brown or black) in the wound bed.  Wound Description (Comments):   Present on Admission: Yes     Pressure Injury 03/30/20 Foot Left;Lateral;Upper Stage 1 -  Intact skin with non-blanchable redness of a localized area usually over a bony prominence. (Active)  03/30/20   Location: Foot  Location Orientation: Left;Lateral;Upper  Staging: Stage 1 -  Intact skin with non-blanchable redness of a localized area usually over a bony prominence.  Wound Description (Comments):   Present on Admission: Yes    #8. 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 Continue to follow.    DVT prophylaxis: Heparin Code Status: Full Family Communication: None Disposition Plan:  . Patient came from: Home            . Anticipated d/c place: Home . Barriers to d/c OR conditions which need to be met to effect a safe d/c:   Consultants:   Podiatry  Procedures: Left foot wound debridement 6/8  Antimicrobials:  Rocephin and vancomycin.  Subjective: Patient still has some pain in the left foot. Otherwise she is doing well. No fever or chills. No nausea vomiting or diarrhea. No short of breath or cough. No dysuria hematuria.  Objective: Vitals:   03/31/20 0511 03/31/20 0542 03/31/20 0756 03/31/20 0801  BP: (!) 86/69 95/68 (!) 86/74 97/70  Pulse: 90 89 91 93  Resp:   15   Temp:   98.4 F (36.9 C)   TempSrc:   Oral   SpO2:  100%   Weight:      Height:        Intake/Output Summary (Last 24 hours) at 03/31/2020 0940 Last data filed at 03/31/2020 0416 Gross per 24 hour  Intake 401.27 ml  Output --  Net 401.27 ml   Filed Weights   03/30/20 1232 03/31/20 0209  Weight: 88.5 kg 86.4 kg    Examination:  General exam: Appears calm and comfortable  Respiratory system: Clear to auscultation.  Respiratory effort normal. Cardiovascular system: S1 & S2 heard, RRR. No JVD, murmurs, rubs, gallops or clicks. No pedal edema. Gastrointestinal system: Abdomen is nondistended, soft and nontender. No organomegaly or masses felt. Normal bowel sounds heard. Central nervous system: Alert and oriented. No focal neurological deficits. Extremities: Left foot diabetic wound. Skin: No rashes, lesions or ulcers Psychiatry: Judgement and insight appear normal. Mood & affect appropriate.     Data Reviewed: I have personally reviewed following labs and imaging studies  CBC: Recent Labs  Lab 03/30/20 1247 03/31/20 0444  WBC 7.7 7.3  NEUTROABS 5.9  --   HGB 11.6* 10.9*  HCT 34.9* 33.2*  MCV 87.0 88.5  PLT 475* 161*   Basic Metabolic Panel: Recent Labs  Lab 03/30/20 1247 03/31/20 0444  NA 135 138  K 3.5 3.6  CL 100 103  CO2 26 27  GLUCOSE 236* 178*  BUN 15 15  CREATININE 1.62* 1.53*  CALCIUM 8.8* 8.4*   GFR: Estimated Creatinine Clearance: 55 mL/min (A) (by C-G formula based on SCr of 1.53 mg/dL (H)). Liver Function Tests: Recent Labs  Lab 03/30/20 1247  AST 19  ALT 23  ALKPHOS 88  BILITOT 0.6  PROT 7.8  ALBUMIN 3.2*   No results for input(s): LIPASE, AMYLASE in the last 168 hours. No results for input(s): AMMONIA in the last 168 hours. Coagulation Profile: Recent Labs  Lab 03/30/20 1247 03/31/20 0444  INR 1.1 1.1   Cardiac Enzymes: No results for input(s): CKTOTAL, CKMB, CKMBINDEX, TROPONINI in the last 168 hours. BNP (last 3 results) No results for input(s): PROBNP in the last 8760 hours. HbA1C: No results for input(s): HGBA1C in the last 72 hours. CBG: Recent Labs  Lab 03/30/20 1246 03/30/20 2116 03/31/20 0018 03/31/20 0428 03/31/20 0756  GLUCAP 211* 152* 162* 172* 155*   Lipid Profile: No results for input(s): CHOL, HDL, LDLCALC, TRIG, CHOLHDL, LDLDIRECT in the last 72 hours. Thyroid Function Tests: No results for input(s): TSH, T4TOTAL, FREET4,  T3FREE, THYROIDAB in the last 72 hours. Anemia Panel: No results for input(s): VITAMINB12, FOLATE, FERRITIN, TIBC, IRON, RETICCTPCT in the last 72 hours. Sepsis Labs: Recent Labs  Lab 03/30/20 1247 03/30/20 1740  LATICACIDVEN 2.6* 1.9    Recent Results (from the past 240 hour(s))  Culture, blood (Routine x 2)     Status: None (Preliminary result)   Collection Time: 03/30/20 12:47 PM   Specimen: BLOOD  Result Value Ref Range Status   Specimen Description BLOOD RIGHT ANTECUBITAL  Final   Special Requests   Final    BOTTLES DRAWN AEROBIC AND ANAEROBIC Blood Culture adequate volume   Culture   Final    NO GROWTH < 24 HOURS Performed at [facility], [name] [place], [place] [address]    Report Status PENDING  Incomplete  Culture, blood (Routine x 2)     Status: None (Preliminary result)   Collection Time: 03/30/20  5:40 PM   Specimen: BLOOD  Result Value Ref Range Status   Specimen Description BLOOD BLOOD  RIGHT FOREARM  Final   Special Requests   Final    BOTTLES DRAWN AEROBIC AND ANAEROBIC Blood Culture results may not be optimal due to an inadequate volume of blood received in culture bottles   Culture   Final    NO GROWTH < 24 HOURS Performed at [facility], [address] [place], [place] [address]    Report Status PENDING  Incomplete  SARS Coronavirus 2 by RT PCR (hospital order, performed in [facility] hospital lab) Nasopharyngeal Nasopharyngeal Swab     Status: None   Collection Time: 03/30/20  5:41 PM   Specimen: Nasopharyngeal Swab  Result Value Ref Range Status   SARS Coronavirus 2 NEGATIVE NEGATIVE Final    Comment: (NOTE) SARS-CoV-2 target nucleic acids are NOT DETECTED. The SARS-CoV-2 RNA is generally detectable in upper and lower respiratory specimens during the acute phase of infection. The lowest concentration of SARS-CoV-2 viral copies this assay can detect is 250 copies / mL. A negative result does not preclude SARS-CoV-2  infection and should not be used as the sole basis for treatment or other patient management decisions.  A negative result may occur with improper specimen collection / handling, submission of specimen other than nasopharyngeal swab, presence of viral mutation(s) within the areas targeted by this assay, and inadequate number of viral copies (<250 copies / mL). A negative result must be combined with clinical observations, patient history, and epidemiological information. Fact Sheet for Patients:   StrictlyIdeas.no Fact Sheet for Healthcare Providers: BankingDealers.co.za This test is not yet approved or cleared  by the [place] FDA and has been authorized for detection and/or diagnosis of SARS-CoV-2 by FDA under an Emergency Use Authorization (EUA).  This EUA will remain in effect (meaning this test can be used) for the duration of the COVID-19 declaration under Section 564(b)(1) of the Act, 21 U.S.C. section 360bbb-3(b)(1), unless the authorization is terminated or revoked sooner. Performed at [facility], [address] [place], [place] [address]   Aerobic/Anaerobic Culture (surgical/deep wound)     Status: None (Preliminary result)   Collection Time: 03/30/20 10:05 PM   Specimen: Wound  Result Value Ref Range Status   Specimen Description   Final    WOUND Performed at [facility], [address], [place], [place] [address]    Special Requests   Final    NONE Performed at [facility], [address] [place], [place] [address]    Gram Stain   Final    NO WBC SEEN RARE GRAM POSITIVE COCCI IN PAIRS Performed at [facility], [place] [address] [place], [place] [address]    Culture PENDING  Incomplete   Report Status PENDING  Incomplete  Surgical pcr screen     Status: None   Collection Time: 03/30/20 10:05 PM   Specimen: Nasal Mucosa; Nasal Swab  Result Value Ref Range Status   MRSA,  PCR NEGATIVE NEGATIVE Final   Staphylococcus aureus NEGATIVE NEGATIVE Final    Comment: (NOTE) The Xpert SA Assay (FDA approved for NASAL specimens in patients 74 years of age and older), is one component of a comprehensive surveillance program. It is not intended to diagnose infection nor to guide or monitor treatment. Performed at [facility], [address] [place], [place] [address]          Radiology Studies: DG Foot Complete Left  Result Date: 03/30/2020 CLINICAL DATA:  Infection along the surgical site fall left foot amputation. EXAM: LEFT  FOOT - COMPLETE 3+ VIEW COMPARISON:  01/25/2020 FINDINGS: There has been a previous amputation of the fifth and first toes, as well as the distal first metatarsal and the fourth toe from distal aspect the proximal phalanx. Second toe proximal phalanx is dislocated dorsal to the second metatarsal head. There is bone resorption along plantar and medial aspect of the base of the second toe proximal phalanx. There is a smaller more subtle area of resorption along the plantar base of the proximal phalanx of the third toe. The amputated margins of the distal proximal phalanx of the fourth toe and first metatarsal are well-defined. There is forefoot soft tissue swelling.  No soft tissue air. IMPRESSION: 1. Findings consistent with osteomyelitis of the plantar base of the proximal phalanx of the second toe and possibly of the plantar base of the proximal phalanx of the third toe. No other evidence of osteomyelitis. 2. Chronic dislocation of the proximal phalanx of the second toe dorsal to the second metatarsal head. 3. Previous amputations as described stable from the prior study. 4. Diffuse forefoot soft tissue swelling.  No soft tissue air. Electronically Signed   By: Lajean Manes M.D.   On: 03/30/2020 13:34        Scheduled Meds: . amitriptyline  150 mg Oral QHS  . atorvastatin  20 mg Oral QHS  . bethanechol  25 mg Oral BID  . furosemide   20 mg Oral Daily  . heparin  5,000 Units Subcutaneous Q8H  . hydrOXYzine  50 mg Oral BID  . insulin aspart  0-15 Units Subcutaneous Q4H  . metoprolol tartrate  25 mg Oral BID  . pantoprazole  20 mg Oral Daily  . prazosin  5 mg Oral QHS  . QUEtiapine  800 mg Oral QHS  . tamsulosin  0.4 mg Oral BID  . venlafaxine XR  150 mg Oral QHS   Continuous Infusions: . cefTRIAXone (ROCEPHIN)  IV    . vancomycin       LOS: 1 day    Time spent: 25 minutes    Sharen Hones, MD [facility]   To contact the attending provider between 7A-7P or the covering provider during after hours 7P-7A, please log into the web site [email or web address] and access using universal [place] password for that web site. If you do not have the password, please call the hospital operator.  03/31/2020, 9:40 AM

## 2020-03-31 NOTE — Progress Notes (Signed)
Pt ate breasfast this am.  Pt was told not to eat or drink past mn. Breakfast  Tray was brought in and pt ate/  Made npo at this time  Pt ate at 0830 am

## 2020-03-31 NOTE — Transfer of Care (Signed)
Immediate Anesthesia Transfer of Care Note  Patient: [name]  Procedure(s) Performed: TRANSMETATARSAL AMPUTATION (Left Foot) ACHILLES LENGTHENING (Left Leg Lower)  Patient Location: PACU  Anesthesia Type:General  Level of Consciousness: sedated and patient cooperative  Airway & Oxygen Therapy: Patient Spontanous Breathing and Patient connected to face mask oxygen  Post-op Assessment: Report given to RN and Post -op Vital signs reviewed and stable  Post vital signs: Reviewed and stable  Last Vitals:  Vitals Value Taken Time  BP 97/81 03/31/20 2145  Temp 36.4 C 03/31/20 2145  Pulse 90 03/31/20 2146  Resp 16 03/31/20 2146  SpO2 100 % 03/31/20 2146  Vitals shown include unvalidated device data.  Last Pain:  Vitals:   03/31/20 1543  TempSrc: Oral  PainSc:          Complications: No apparent anesthesia complications

## 2020-03-31 NOTE — H&P (Signed)
HISTORY AND PHYSICAL INTERVAL NOTE:  03/31/2020  6:23 PM  [name] A Fath  has presented today for surgery, with the diagnosis of OSTEOMYELITIS OF LEFT FOOT W/CELLULITIS.  The various methods of treatment have been discussed with the patient.  No guarantees were given.  After consideration of risks, benefits and other options for treatment, the patient has consented to surgery.  I have reviewed the patients' chart and labs.    PROCEDURE: LEFT TRANSMETATARSAL AMPUTATION WITH TENDOACHILLES LENGTHENING  A history and physical examination was performed in my office.  The patient was reexamined.  There have been no changes to this history and physical examination.  Caroline More, DPM

## 2020-03-31 NOTE — Anesthesia Procedure Notes (Signed)
Procedure Name: LMA Insertion Date/Time: 03/31/2020 8:13 PM Performed by: Lendon Colonel, CRNA Pre-anesthesia Checklist: Patient identified, Patient being monitored, Timeout performed, Emergency Drugs available and Suction available Patient Re-evaluated:Patient Re-evaluated prior to induction Oxygen Delivery Method: Circle system utilized Preoxygenation: Pre-oxygenation with 100% oxygen Induction Type: IV induction Ventilation: Mask ventilation without difficulty LMA: LMA inserted LMA Size: 3.0 Tube type: Oral Number of attempts: 1 Placement Confirmation: positive ETCO2 and breath sounds checked- equal and bilateral Tube secured with: Tape Dental Injury: Teeth and Oropharynx as per pre-operative assessment

## 2020-03-31 NOTE — Progress Notes (Signed)
Patient blood pressure was 151/98 when she came to floor. She received her scheduled blood pressure medications and lasix which are the same medications she takes at home. Her blood pressure currently is running soft. BP 94/70 and HR 88. NP was notified. Will continue to monitor.

## 2020-03-31 NOTE — Progress Notes (Signed)
Initial Nutrition Assessment  DOCUMENTATION CODES:   Not applicable  INTERVENTION:  Once diet is advanced provide Ensure Max Protein po BID, each supplement provides 150 kcal and 30 grams of protein.  Provide daily MVI.  Encouraged adequate intake of protein with diet advancement. Discussed which foods contain protein.  NUTRITION DIAGNOSIS:   Increased nutrient needs related to wound healing, catabolic illness(CHF, COPD) as evidenced by estimated needs.  GOAL:   Patient will meet greater than or equal to 90% of their needs  MONITOR:   PO intake, Supplement acceptance, Diet advancement, Labs, Weight trends, Skin, I & O's  REASON FOR ASSESSMENT:   Malnutrition Screening Tool    ASSESSMENT:   44 year old female with PMHx of DM, HTN, seizures, polysubstance abuse, chronic combined systolic and diastolic CHF, COPD, bipolar disorder, hx osteomyelitis of left food s/p multiple amputations (amputation left great toe 03/23/2019, amputation left 5th toe 08/16/2019, partial amputation left 4th toe 02/05/2020), recent right ankle fracture who is now admitted with osteomyelitis and cellulitis of left second and third toes.   Met with patient and her mother at bedside. Patient is currently NPO awaiting planned transmetatarsal amputation later today. Patient reports she has had a decreased appetite on-and-off for the past year. Her intake varies day to day. Some days she can only have one small meal then other days she can eat 3-4 meals. She reports she does not eat "healthy" food at home but is unable to provide exact details of usual intake. She reports that over the past 3 weeks her appetite has been even worse. Some days she can only have flavored ice. Discussed importance of adequate intake of protein and nutrients for wound healing. Patient is amenable to drinking Ensure Max once diet is advanced. Also encouraged adequate intake of protein at meals with diet advancement. Patient reports she  has not been able to ambulate well in setting of her right ankle fracture and left foot wound.  Patient reports her UBW was around 300 lbs and that she has been losing weight over time due to decreased appetite/intake. Could not find a weight as high as 300 lbs in chart. Patient was 124.7 kg in 2016. She was 95.3 kg on 04/23/2019. She is now 86.4 kg (190.59 lbs). She has lost 8.9 kg (9.3% body weight) over the past year, which is not significant for time frame.  Medications reviewed and include: Lasix 20 mg daily, Novolog 0-15 units Q4hrs, Protonix, ceftriaxone, vancomycin.  Labs reviewed: CBG 155-208, HgbA1c 7.3.  Patient is at risk for malnutrition but does not meet criteria for malnutrition at this time.  NUTRITION - FOCUSED PHYSICAL EXAM:    Most Recent Value  Orbital Region  No depletion  Upper Arm Region  Mild depletion  Thoracic and Lumbar Region  No depletion  Buccal Region  No depletion  Temple Region  No depletion  Clavicle Bone Region  Mild depletion  Clavicle and Acromion Bone Region  Mild depletion  Scapular Bone Region  No depletion  Dorsal Hand  No depletion  Patellar Region  Moderate depletion  Anterior Thigh Region  Moderate depletion  Posterior Calf Region  Moderate depletion  Edema (RD Assessment)  Mild  Hair  Reviewed  Eyes  Reviewed  Mouth  Reviewed  Skin  Reviewed  Nails  Reviewed     Diet Order:   Diet Order            Diet NPO time specified  Diet effective now  EDUCATION NEEDS:   Education needs have been addressed  Skin:  Skin Assessment: Skin Integrity Issues: Skin Integrity Issues:: Stage I, Unstageable, Other (Comment) Stage I: left heel (2cm x 3cm); left lateral upper foot (1.5 cm x 1cm) Unstageable: toe (not documented which toe; 2cm x 3cm) Other: Open wound upper sole of left foot (2cm x 4cm)  Last BM:  03/30/2020  Height:   Ht Readings from Last 1 Encounters:  03/31/20 5' 9" (1.753 m)   Weight:   Wt Readings from  Last 1 Encounters:  03/31/20 86.4 kg   BMI:  Body mass index is 28.14 kg/m.  Estimated Nutritional Needs:   Kcal:  2200-2400  Protein:  110-120 grams  Fluid:  per MD  Jacklynn Barnacle, MS, RD, LDN Pager number available on Amion

## 2020-03-31 NOTE — Progress Notes (Signed)
*  PRELIMINARY RESULTS* Echocardiogram 2D Echocardiogram has been performed.  [name] 03/31/2020, 9:24 AM

## 2020-03-31 NOTE — Anesthesia Preprocedure Evaluation (Addendum)
Anesthesia Evaluation  Patient identified by MRN, date of birth, ID band Patient awake    Reviewed: Allergy & Precautions, H&P , NPO status , Patient's Chart, lab work & pertinent test results  Airway Mallampati: II  TM Distance: <3 FB     Dental  (+) Edentulous Upper, Edentulous Lower   Pulmonary COPD, Current Smoker,     + decreased breath sounds      Cardiovascular hypertension, +CHF   Rhythm:regular Rate:Normal     Neuro/Psych Seizures -,  PSYCHIATRIC DISORDERS Anxiety Bipolar Disorder    GI/Hepatic Neg liver ROS, GERD  ,  Endo/Other  diabetes  Renal/GU      Musculoskeletal   Abdominal   Peds  Hematology negative hematology ROS (+)   Anesthesia Other Findings Past Medical History: No date: Bipolar disorder ([place]) No date: Chronic combined systolic and diastolic CHF (congestive  heart failure) ([place])     Comment:  a. ECHO 04/23/14 LV EF: 45% - 50%,  mild LVH, diffuse               hypokinesis. LV diastolic dysfunction, high ventricular               filling pressure. Mild LA dilation. No date: Cocaine abuse ([facility]) No date: COPD (chronic obstructive pulmonary disease) ([facility]) No date: Diabetes mellitus without complication ([facility]) No date: GERD (gastroesophageal reflux disease) 10/2018: Heroin overdose ([place]) No date: Hypertension No date: MRSA infection     Comment:  arm wound No date: Nicotine dependence with current use No date: Seizures [facility])     Comment:  last seizure March 2021; usual 1-2 per month  Past Surgical History: No date: ABDOMINAL HYSTERECTOMY 03/23/2019: AMPUTATION TOE; Left     Comment:  Procedure: AMPUTATION TOE GREAT LEFT TOE;  Surgeon:               [name] Patricia, DPM;  Location: [facility];  Service:               Podiatry;  Laterality: Left; 08/16/2019: AMPUTATION TOE; Left     Comment:  Procedure: AMPUTATION TOE 5th;  Surgeon: Caroline More,               DPM;  Location: [facility];   Service: Podiatry;                Laterality: Left; 02/05/2020: AMPUTATION TOE; Left     Comment:  Procedure: AMPUTATION MPJ LEFT 4TH;  Surgeon: Caroline More, DPM;  Location: [facility];  Service: Podiatry;                Laterality: Left; No date: ANKLE FRACTURE SURGERY; Left     Comment:  pins/screws in place 2005: CHOLECYSTECTOMY No date: FRACTURE SURGERY 03/12/2019: LOWER EXTREMITY ANGIOGRAPHY; Left     Comment:  Procedure: LOWER EXTREMITY ANGIOGRAPHY;  Surgeon:               Katha Cabal, MD;  Location: [place] [facility];               Service: Cardiovascular;  Laterality: Left; 03/26/2019: TEE WITHOUT CARDIOVERSION; N/A     Comment:  Procedure: TRANSESOPHAGEAL ECHOCARDIOGRAM (TEE);                Surgeon: Nelva Bush, MD;  Location: [facility];                Service: Cardiovascular;  Laterality: N/A; No date: TUBAL LIGATION  BMI    Body Mass Index: 28.14 kg/m      Reproductive/Obstetrics negative OB ROS                            Anesthesia Physical Anesthesia Plan  ASA: III  Anesthesia Plan: General LMA   Post-op Pain Management:    Induction:   PONV Risk Score and Plan: Dexamethasone, Ondansetron and Treatment may vary due to age or medical condition  Airway Management Planned:   Additional Equipment:   Intra-op Plan:   Post-operative Plan:   Informed Consent: I have reviewed the patients History and Physical, chart, labs and discussed the procedure including the risks, benefits and alternatives for the proposed anesthesia with the patient or authorized representative who has indicated his/her understanding and acceptance.     Dental Advisory Given  Plan Discussed with: Anesthesiologist, CRNA and Surgeon  Anesthesia Plan Comments:         Anesthesia Quick Evaluation

## 2020-03-31 NOTE — Progress Notes (Signed)
Left floor at this time for  surgery

## 2020-04-01 DIAGNOSIS — N1831 Chronic kidney disease, stage 3a: Secondary | ICD-10-CM

## 2020-04-01 DIAGNOSIS — E1121 Type 2 diabetes mellitus with diabetic nephropathy: Secondary | ICD-10-CM

## 2020-04-01 DIAGNOSIS — L8990 Pressure ulcer of unspecified site, unspecified stage: Secondary | ICD-10-CM | POA: Diagnosis present

## 2020-04-01 LAB — GLUCOSE, CAPILLARY
Glucose-Capillary: 125 mg/dL — ABNORMAL HIGH (ref 70–99)
Glucose-Capillary: 137 mg/dL — ABNORMAL HIGH (ref 70–99)
Glucose-Capillary: 138 mg/dL — ABNORMAL HIGH (ref 70–99)
Glucose-Capillary: 157 mg/dL — ABNORMAL HIGH (ref 70–99)
Glucose-Capillary: 172 mg/dL — ABNORMAL HIGH (ref 70–99)
Glucose-Capillary: 188 mg/dL — ABNORMAL HIGH (ref 70–99)

## 2020-04-01 LAB — BASIC METABOLIC PANEL
Anion gap: 8 (ref 5–15)
BUN: 18 mg/dL (ref 6–20)
CO2: 26 mmol/L (ref 22–32)
Calcium: 8.7 mg/dL — ABNORMAL LOW (ref 8.9–10.3)
Chloride: 106 mmol/L (ref 98–111)
Creatinine, Ser: 1.53 mg/dL — ABNORMAL HIGH (ref 0.44–1.00)
GFR calc Af Amer: 47 mL/min — ABNORMAL LOW (ref >60)
GFR calc non Af Amer: 41 mL/min — ABNORMAL LOW (ref >60)
Glucose, Bld: 179 mg/dL — ABNORMAL HIGH (ref 70–99)
Potassium: 3.9 mmol/L (ref 3.5–5.1)
Sodium: 140 mmol/L (ref 135–145)

## 2020-04-01 MED ORDER — ENOXAPARIN SODIUM 40 MG/0.4ML ~~LOC~~ SOLN
40.0000 mg | SUBCUTANEOUS | Status: DC
  Administered 2020-04-01: 40 mg via SUBCUTANEOUS
  Filled 2020-04-01: qty 0.4

## 2020-04-01 NOTE — Progress Notes (Addendum)
PODIATRY / FOOT AND ANKLE SURGERY PROGRESS NOTE  Requesting Physician: Derrell Lolling, MD  Reason for consult: Left forefoot infection  Chief Complaint: Left forefoot infection   HPI: [name] is a 44 y.o. female who presents for follow-up status post 1 day left transmetatarsal amputation with tendo Achilles lengthening.  Patient splint dressing is clean, dry, and intact today.  Patient has minimal pain to the left foot or ankle today and is doing fairly well.  Patient has kept the cast to the right lower extremity clean, dry, and intact since her hospitalization.  Patient is also remain nonweightbearing to the right lower extremity all times.  Patient is doing well today with no further complaints.  PMHx:  Past Medical History:  Diagnosis Date  . Bipolar disorder ([place])   . Chronic combined systolic and diastolic CHF (congestive heart failure) ([place])    a. ECHO 04/23/14 LV EF: 45% - 50%,  mild LVH, diffuse hypokinesis. LV diastolic dysfunction, high ventricular filling pressure. Mild LA dilation.  . Cocaine abuse ([place])   . COPD (chronic obstructive pulmonary disease) ([place])   . Diabetes mellitus without complication ([place])   . GERD (gastroesophageal reflux disease)   . Heroin overdose ([place]) 10/2018  . Hypertension   . MRSA infection    arm wound  . Nicotine dependence with current use   . Seizures ([place])    last seizure March 2021; usual 1-2 per month    Surgical Hx:  Past Surgical History:  Procedure Laterality Date  . ABDOMINAL HYSTERECTOMY    . ACHILLES TENDON SURGERY Left 03/31/2020   Procedure: ACHILLES LENGTHENING;  Surgeon: Caroline More, DPM;  Location: [facility];  Service: Podiatry;  Laterality: Left;  . AMPUTATION TOE Left 03/23/2019   Procedure: AMPUTATION TOE GREAT LEFT TOE;  Surgeon: [name] Patricia, DPM;  Location: [facility];  Service: Podiatry;  Laterality: Left;  . AMPUTATION TOE Left 08/16/2019   Procedure: AMPUTATION TOE 5th;  Surgeon: Caroline More, DPM;  Location:  [facility];  Service: Podiatry;  Laterality: Left;  . AMPUTATION TOE Left 02/05/2020   Procedure: AMPUTATION MPJ LEFT 4TH;  Surgeon: Caroline More, DPM;  Location: [facility];  Service: Podiatry;  Laterality: Left;  . ANKLE FRACTURE SURGERY Left    pins/screws in place  . CHOLECYSTECTOMY  2005  . FRACTURE SURGERY    . LOWER EXTREMITY ANGIOGRAPHY Left 03/12/2019   Procedure: LOWER EXTREMITY ANGIOGRAPHY;  Surgeon: Katha Cabal, MD;  Location: [place] [facility];  Service: Cardiovascular;  Laterality: Left;  . TEE WITHOUT CARDIOVERSION N/A 03/26/2019   Procedure: TRANSESOPHAGEAL ECHOCARDIOGRAM (TEE);  Surgeon: Nelva Bush, MD;  Location: [facility];  Service: Cardiovascular;  Laterality: N/A;  . TRANSMETATARSAL AMPUTATION Left 03/31/2020   Procedure: TRANSMETATARSAL AMPUTATION;  Surgeon: Caroline More, DPM;  Location: [facility];  Service: Podiatry;  Laterality: Left;  . TUBAL LIGATION      FHx:  Family History  Problem Relation Age of Onset  . Hypertension Other   . Drug abuse Mother   . Seizures Neg Hx     Social History:  reports that she has been smoking cigarettes. She has a 28.00 pack-year smoking history. She has never used smokeless tobacco. She reports previous alcohol use. She reports previous drug use.  Allergies:  Allergies  Allergen Reactions  . Albumin (Human)     unknown  . Erythromycin Rash  . Midecamycin Rash    Review of Systems: General ROS: negative Respiratory ROS: no cough, shortness of breath, or wheezing Cardiovascular  ROS: no chest pain or dyspnea on exertion Gastrointestinal ROS: no abdominal pain, change in bowel habits, or black or bloody stools Musculoskeletal ROS: positive for - joint swelling Neurological ROS: positive for - numbness/tingling Dermatological ROS: positive for Left transmetatarsal amputation site  Medications Prior to Admission  Medication Sig Dispense Refill  . amitriptyline (ELAVIL) 150 MG tablet Take 150 mg by mouth at  bedtime.    Marland Kitchen atorvastatin (LIPITOR) 20 MG tablet Take 20 mg by mouth at bedtime.     . bethanechol (URECHOLINE) 25 MG tablet Take 25 mg by mouth 2 (two) times daily.     Marland Kitchen ELIQUIS 2.5 MG TABS tablet Take 2.5 mg by mouth 2 (two) times daily.    . furosemide (LASIX) 40 MG tablet Take 40 mg by mouth daily.     Marland Kitchen gabapentin (NEURONTIN) 600 MG tablet Take 600 mg by mouth 3 (three) times daily.    Marland Kitchen glipiZIDE (GLUCOTROL) 10 MG tablet Take 10 mg by mouth daily.     . hydrOXYzine (ATARAX/VISTARIL) 50 MG tablet Take 50 mg by mouth 3 (three) times daily as needed for anxiety or itching.   0  . insulin aspart (NOVOLOG) 100 UNIT/ML injection Inject 1 Units into the skin 3 (three) times daily before meals.     . insulin glargine (LANTUS) 100 UNIT/ML injection Inject 30 Units into the skin 2 (two) times a day.     . metFORMIN (GLUCOPHAGE) 1000 MG tablet Take 1,000 mg by mouth 2 (two) times daily with a meal.    . metoprolol tartrate (LOPRESSOR) 50 MG tablet Take 25 mg by mouth 2 (two) times daily.     . mirtazapine (REMERON) 15 MG tablet Take 7.5-15 mg by mouth at bedtime.    . pantoprazole (PROTONIX) 20 MG tablet Take 20 mg by mouth daily.     . prazosin (MINIPRESS) 5 MG capsule Take 5 mg by mouth at bedtime.    . promethazine (PHENERGAN) 12.5 MG tablet Take 12.5 mg by mouth every 6 (six) hours as needed for nausea or vomiting.   3  . QUEtiapine (SEROQUEL) 400 MG tablet Take 800 mg by mouth at bedtime.    . tamsulosin (FLOMAX) 0.4 MG CAPS capsule Take 0.4 mg by mouth daily.     Marland Kitchen venlafaxine XR (EFFEXOR-XR) 150 MG 24 hr capsule Take 150 mg by mouth at bedtime.   3    Physical Exam: General: Alert and oriented.  No apparent distress.  Dressings and splint appears to be clean, dry, and intact with no strikethrough.  Appears stable at this time.  Results for orders placed or performed during the hospital encounter of 03/30/20 (from the past 48 hour(s))  Lactic acid, plasma     Status: None   Collection  Time: 03/30/20  5:40 PM  Result Value Ref Range   Lactic Acid, Venous 1.9 0.5 - 1.9 mmol/L    Comment: Performed at [facility], [address] [place], [place] [address]  Culture, blood (Routine x 2)     Status: None (Preliminary result)   Collection Time: 03/30/20  5:40 PM   Specimen: BLOOD  Result Value Ref Range   Specimen Description BLOOD BLOOD RIGHT FOREARM    Special Requests      BOTTLES DRAWN AEROBIC AND ANAEROBIC Blood Culture results may not be optimal due to an inadequate volume of blood received in culture bottles   Culture      NO GROWTH 2 DAYS Performed at [facility]  [facility], [place], [place] [address]    Report Status PENDING   Sedimentation rate     Status: Abnormal   Collection Time: 03/30/20  5:40 PM  Result Value Ref Range   Sed Rate 73 (H) 0 - 20 mm/hr    Comment: Performed at [facility], [address] [place], [place] [address]  C-reactive protein     Status: Abnormal   Collection Time: 03/30/20  5:40 PM  Result Value Ref Range   CRP 5.3 (H) <1.0 mg/dL    Comment: Performed at [facility], [place] [address] [place], [place] [address]  SARS Coronavirus 2 by RT PCR (hospital order, performed in [facility] hospital lab) Nasopharyngeal Nasopharyngeal Swab     Status: None   Collection Time: 03/30/20  5:41 PM   Specimen: Nasopharyngeal Swab  Result Value Ref Range   SARS Coronavirus 2 NEGATIVE NEGATIVE    Comment: (NOTE) SARS-CoV-2 target nucleic acids are NOT DETECTED. The SARS-CoV-2 RNA is generally detectable in upper and lower respiratory specimens during the acute phase of infection. The lowest concentration of SARS-CoV-2 viral copies this assay can detect is 250 copies / mL. A negative result does not preclude SARS-CoV-2 infection and should not be used as the sole basis for treatment or other patient management decisions.  A negative result may occur with improper specimen collection / handling,  submission of specimen other than nasopharyngeal swab, presence of viral mutation(s) within the areas targeted by this assay, and inadequate number of viral copies (<250 copies / mL). A negative result must be combined with clinical observations, patient history, and epidemiological information. Fact Sheet for Patients:   StrictlyIdeas.no Fact Sheet for Healthcare Providers: BankingDealers.co.za This test is not yet approved or cleared  by the [place] FDA and has been authorized for detection and/or diagnosis of SARS-CoV-2 by FDA under an Emergency Use Authorization (EUA).  This EUA will remain in effect (meaning this test can be used) for the duration of the COVID-19 declaration under Section 564(b)(1) of the Act, 21 U.S.C. section 360bbb-3(b)(1), unless the authorization is terminated or revoked sooner. Performed at [facility], [address] [place], [place] [address]   Glucose, capillary     Status: Abnormal   Collection Time: 03/30/20  9:16 PM  Result Value Ref Range   Glucose-Capillary 152 (H) 70 - 99 mg/dL    Comment: Glucose reference range applies only to samples taken after fasting for at least 8 hours.   Comment 1 Notify RN   Aerobic/Anaerobic Culture (surgical/deep wound)     Status: None (Preliminary result)   Collection Time: 03/30/20 10:05 PM   Specimen: Wound  Result Value Ref Range   Specimen Description      WOUND Performed at [facility], [address], [place], [place] [address]    Special Requests      NONE Performed at [facility], [place], [place] [address]    Gram Stain NO WBC SEEN RARE GRAM POSITIVE COCCI IN PAIRS     Culture      FEW STAPHYLOCOCCUS AUREUS SUSCEPTIBILITIES TO FOLLOW Performed at [facility], [place] [address] [place],  [address]    Report Status PENDING   Surgical pcr screen     Status: None   Collection Time:  03/30/20 10:05 PM   Specimen: Nasal Mucosa; Nasal Swab  Result Value Ref Range   MRSA, PCR NEGATIVE NEGATIVE   Staphylococcus aureus  NEGATIVE NEGATIVE    Comment: (NOTE) The Xpert SA Assay (FDA approved for NASAL specimens in patients 76 years of age and older), is one component of a comprehensive surveillance program. It is not intended to diagnose infection nor to guide or monitor treatment. Performed at [facility], [address] [address] [place], [place] [address]   Glucose, capillary     Status: Abnormal   Collection Time: 03/31/20 12:18 AM  Result Value Ref Range   Glucose-Capillary 162 (H) 70 - 99 mg/dL    Comment: Glucose reference range applies only to samples taken after fasting for at least 8 hours.   Comment 1 Notify RN   Glucose, capillary     Status: Abnormal   Collection Time: 03/31/20  4:28 AM  Result Value Ref Range   Glucose-Capillary 172 (H) 70 - 99 mg/dL    Comment: Glucose reference range applies only to samples taken after fasting for at least 8 hours.  Hemoglobin A1c     Status: Abnormal   Collection Time: 03/31/20  4:44 AM  Result Value Ref Range   Hgb A1c MFr Bld 7.3 (H) 4.8 - 5.6 %    Comment: (NOTE) Pre diabetes:          5.7%-6.4% Diabetes:              >6.4% Glycemic control for   <7.0% adults with diabetes    Mean Plasma Glucose 162.81 mg/dL    Comment: Performed at [facility] [address], [place], [place] [address]  HIV Antibody (routine testing w rflx)     Status: None   Collection Time: 03/31/20  4:44 AM  Result Value Ref Range   HIV Screen 4th Generation wRfx Non Reactive Non Reactive    Comment: Performed at [facility], [place] [address], [place], [place] [address]  Basic metabolic panel     Status: Abnormal   Collection Time: 03/31/20  4:44 AM  Result Value Ref Range   Sodium 138 135 - 145 mmol/L   Potassium 3.6 3.5 - 5.1 mmol/L   Chloride 103 98 - 111 mmol/L   CO2 27 22 - 32 mmol/L   Glucose, Bld 178 (H) 70 - 99  mg/dL    Comment: Glucose reference range applies only to samples taken after fasting for at least 8 hours.   BUN 15 6 - 20 mg/dL   Creatinine, Ser 1.53 (H) 0.44 - 1.00 mg/dL   Calcium 8.4 (L) 8.9 - 10.3 mg/dL   GFR calc non Af Amer 41 (L) >60 mL/min   GFR calc Af Amer 47 (L) >60 mL/min   Anion gap 8 5 - 15    Comment: Performed at [facility], [address] [place], [place] [address]  CBC     Status: Abnormal   Collection Time: 03/31/20  4:44 AM  Result Value Ref Range   WBC 7.3 4.0 - 10.5 K/uL   RBC 3.75 (L) 3.87 - 5.11 MIL/uL   Hemoglobin 10.9 (L) 12.0 - 15.0 g/dL   HCT 33.2 (L) 36.0 - 46.0 %   MCV 88.5 80.0 - 100.0 fL   MCH 29.1 26.0 - 34.0 pg   MCHC 32.8 30.0 - 36.0 g/dL   RDW 14.0 11.5 - 15.5 %   Platelets 464 (H) 150 - 400 K/uL   nRBC 0.0 0.0 - 0.2 %    Comment: Performed at [facility], [address], [place], [place] [address]  Protime-INR  Status: None   Collection Time: 03/31/20  4:44 AM  Result Value Ref Range   Prothrombin Time 14.2 11.4 - 15.2 seconds   INR 1.1 0.8 - 1.2    Comment: (NOTE) INR goal varies based on device and disease states. Performed at [facility], [address] [place], [place] [address]   Urine Drug Screen, Qualitative     Status: Abnormal   Collection Time: 03/31/20  6:29 AM  Result Value Ref Range   Tricyclic, Ur Screen POSITIVE (A) NONE DETECTED   Amphetamines, Ur Screen NONE DETECTED NONE DETECTED   MDMA (Ecstasy)Ur Screen NONE DETECTED NONE DETECTED   Cocaine Metabolite,Ur [place] NONE DETECTED NONE DETECTED   Opiate, Ur Screen NONE DETECTED NONE DETECTED   Phencyclidine (PCP) Ur S NONE DETECTED NONE DETECTED   Cannabinoid 50 Ng, Ur [place] NONE DETECTED NONE DETECTED   Barbiturates, Ur Screen NONE DETECTED NONE DETECTED   Benzodiazepine, Ur Scrn NONE DETECTED NONE DETECTED   Methadone Scn, Ur NONE DETECTED NONE DETECTED    Comment: (NOTE) Tricyclics + metabolites, urine    Cutoff 1000  ng/mL Amphetamines + metabolites, urine  Cutoff 1000 ng/mL MDMA (Ecstasy), urine              Cutoff 500 ng/mL Cocaine Metabolite, urine          Cutoff 300 ng/mL Opiate + metabolites, urine        Cutoff 300 ng/mL Phencyclidine (PCP), urine         Cutoff 25 ng/mL Cannabinoid, urine                 Cutoff 50 ng/mL Barbiturates + metabolites, urine  Cutoff 200 ng/mL Benzodiazepine, urine              Cutoff 200 ng/mL Methadone, urine                   Cutoff 300 ng/mL The urine drug screen provides only a preliminary, unconfirmed analytical test result and should not be used for non-medical purposes. Clinical consideration and professional judgment should be applied to any positive drug screen result due to possible interfering substances. A more specific alternate chemical method must be used in order to obtain a confirmed analytical result. Gas chromatography / mass spectrometry (GC/MS) is the preferred confirmat ory method. Performed at [facility], [address] [place], [place] [address]   Glucose, capillary     Status: Abnormal   Collection Time: 03/31/20  7:56 AM  Result Value Ref Range   Glucose-Capillary 155 (H) 70 - 99 mg/dL    Comment: Glucose reference range applies only to samples taken after fasting for at least 8 hours.  Glucose, capillary     Status: Abnormal   Collection Time: 03/31/20 11:39 AM  Result Value Ref Range   Glucose-Capillary 208 (H) 70 - 99 mg/dL    Comment: Glucose reference range applies only to samples taken after fasting for at least 8 hours.  Glucose, capillary     Status: Abnormal   Collection Time: 03/31/20  4:18 PM  Result Value Ref Range   Glucose-Capillary 175 (H) 70 - 99 mg/dL    Comment: Glucose reference range applies only to samples taken after fasting for at least 8 hours.  Glucose, capillary     Status: Abnormal   Collection Time: 03/31/20  9:47 PM  Result Value Ref Range   Glucose-Capillary 160 (H) 70 - 99 mg/dL     Comment: Glucose reference range  applies only to samples taken after fasting for at least 8 hours.  Glucose, capillary     Status: Abnormal   Collection Time: 03/31/20 11:05 PM  Result Value Ref Range   Glucose-Capillary 197 (H) 70 - 99 mg/dL    Comment: Glucose reference range applies only to samples taken after fasting for at least 8 hours.   Comment 1 Notify RN   Glucose, capillary     Status: Abnormal   Collection Time: 04/01/20  3:25 AM  Result Value Ref Range   Glucose-Capillary 157 (H) 70 - 99 mg/dL    Comment: Glucose reference range applies only to samples taken after fasting for at least 8 hours.   Comment 1 Notify RN   Basic metabolic panel     Status: Abnormal   Collection Time: 04/01/20  4:35 AM  Result Value Ref Range   Sodium 140 135 - 145 mmol/L   Potassium 3.9 3.5 - 5.1 mmol/L   Chloride 106 98 - 111 mmol/L   CO2 26 22 - 32 mmol/L   Glucose, Bld 179 (H) 70 - 99 mg/dL    Comment: Glucose reference range applies only to samples taken after fasting for at least 8 hours.   BUN 18 6 - 20 mg/dL   Creatinine, Ser 1.53 (H) 0.44 - 1.00 mg/dL   Calcium 8.7 (L) 8.9 - 10.3 mg/dL   GFR calc non Af Amer 41 (L) >60 mL/min   GFR calc Af Amer 47 (L) >60 mL/min   Anion gap 8 5 - 15    Comment: Performed at [facility], [address] [place], [place] [address]  Glucose, capillary     Status: Abnormal   Collection Time: 04/01/20  7:47 AM  Result Value Ref Range   Glucose-Capillary 137 (H) 70 - 99 mg/dL    Comment: Glucose reference range applies only to samples taken after fasting for at least 8 hours.   Comment 1 Notify RN    Comment 2 Document in Chart   Glucose, capillary     Status: Abnormal   Collection Time: 04/01/20 11:32 AM  Result Value Ref Range   Glucose-Capillary 125 (H) 70 - 99 mg/dL    Comment: Glucose reference range applies only to samples taken after fasting for at least 8 hours.   Comment 1 Notify RN    Comment 2 Document in Chart    DG Foot  Complete Left  Result Date: 03/31/2020 CLINICAL DATA:  Postoperative EXAM: LEFT FOOT - COMPLETE 3+ VIEW COMPARISON:  Radiograph 03/30/2020 FINDINGS: There are expected postsurgical changes following transmetatarsal amputation across all 5 rays including progressive resection of the first metatarsal now truncated at the level of the proximal metadiaphysis. Overlying swelling, gas and skin staples are expected in the postoperative state. Postsurgical changes from prior ORIF of the distal tibia and fibula without acute complication. Midfoot and hindfoot alignment is grossly preserved though incompletely assessed on nonweightbearing films. Casting material may obscure some fine bone and soft-tissue detail. IMPRESSION: Expected postsurgical changes following transmetatarsal amputation across all 5 rays including progressive resection of the first metatarsal now truncated at the level of the proximal metadiaphysis. Electronically Signed   By: Lovena Le M.D.   On: 03/31/2020 23:12   ECHOCARDIOGRAM COMPLETE  Result Date: 03/31/2020    ECHOCARDIOGRAM REPORT   Patient Name:   [name] Date of Exam: 03/31/2020 Medical Rec #:  [id number]       Height:       69.0  in Accession #:    [id number]      Weight:       190.6 lb Date of Birth:  1976/10/11       BSA:          2.024 m Patient Age:    41 years        BP:           97/70 mmHg Patient Gender: F               HR:           93 bpm. Exam Location:  [facility] Procedure: 2D Echo, Cardiac Doppler, Color Doppler and Intracardiac            Opacification Agent Indications:     Preoperative evaluation  History:         Patient has prior history of Echocardiogram examinations, most                  recent 03/26/2019. COPD; Risk Factors:Hypertension and Diabetes.  Sonographer:     Sherrie Sport RDCS (AE) Referring Phys:  [id number] [place] Diagnosing Phys: Harrell Gave End MD  Sonographer Comments: Suboptimal apical window. Image acquisition challenging due to COPD. IMPRESSIONS   1. Left ventricular ejection fraction, by estimation, is 40 to 45%. The left ventricle has mildly decreased function. The left ventricle demonstrates global hypokinesis. The left ventricular internal cavity size was mildly dilated. There is mild left ventricular hypertrophy. Indeterminate diastolic filling due to E-A fusion.  2. Right ventricular systolic function is moderately reduced. The right ventricular size is normal. There is moderately elevated pulmonary artery systolic pressure.  3. Left atrial size was mildly dilated.  4. Degree mitral stenosis may be overestimated by increased transmitral flow in the setting of severe mitral regurgitation. 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 Moderate to severe mitral stenosis.  5. Tricuspid valve regurgitation is mild to moderate.  6. The aortic valve is tricuspid. Aortic valve regurgitation is trivial. No aortic stenosis is present.  7. The inferior vena cava is normal in size with <50% respiratory variability, suggesting right atrial pressure of 8 mmHg. FINDINGS  Left Ventricle: Left ventricular ejection fraction, by estimation, is 40 to 45%. The left ventricle has mildly decreased function. The left ventricle demonstrates global hypokinesis. Definity contrast agent was given IV to delineate the left ventricular  endocardial borders. The left ventricular internal cavity size was mildly dilated. There is mild left ventricular hypertrophy. Indeterminate diastolic filling due to E-A fusion. Right Ventricle: The right ventricular size is normal. No increase in right ventricular wall thickness. Right ventricular systolic function is moderately reduced. There is moderately elevated pulmonary artery systolic pressure. The tricuspid regurgitant velocity is 3.66 m/s, and with an assumed right atrial pressure of 8 mmHg, the estimated right ventricular systolic pressure is 62.6 mmHg. Left Atrium: Left atrial size was mildly dilated. Right Atrium:  Right atrial size was normal in size. Pericardium: There is no evidence of pericardial effusion. Mitral Valve: Degree mitral stenosis may be overestimated by increased transmitral flow in the setting of severe mitral regurgitation. The mitral valve is rheumatic. There is mild thickening of the mitral valve leaflet(s). Severe mitral valve regurgitation. Moderate to severe mitral valve stenosis. MV peak gradient, 14.6 mmHg. The mean mitral valve gradient is 10.0 mmHg. Tricuspid Valve: The tricuspid valve is normal in structure. Tricuspid valve regurgitation is mild to moderate. Aortic Valve: The aortic valve is tricuspid. Aortic valve regurgitation is trivial. No aortic  stenosis is present. Aortic valve mean gradient measures 4.0 mmHg. Aortic valve peak gradient measures 6.5 mmHg. Aortic valve area, by VTI measures 2.29 cm. Pulmonic Valve: The pulmonic valve was not well visualized. Pulmonic valve regurgitation is trivial. No evidence of pulmonic stenosis. Aorta: The aortic root is normal in size and structure. Pulmonary Artery: The pulmonary artery is not well seen. Venous: The inferior vena cava is normal in size with less than 50% respiratory variability, suggesting right atrial pressure of 8 mmHg. IAS/Shunts: The interatrial septum was not well visualized.  LEFT VENTRICLE PLAX 2D LVIDd:         5.25 cm      Diastology LVIDs:         4.43 cm      LV e' lateral:   5.22 cm/s LV PW:         1.08 cm      LV E/e' lateral: 39.1 LV IVS:        1.29 cm      LV e' medial:    7.62 cm/s LVOT diam:     2.00 cm      LV E/e' medial:  26.8 LV SV:         45 LV SV Index:   22 LVOT Area:     3.14 cm  LV Volumes (MOD) LV vol d, MOD A2C: 158.0 ml LV vol d, MOD A4C: 144.0 ml LV vol s, MOD A2C: 106.0 ml LV vol s, MOD A4C: 85.8 ml LV SV MOD A2C:     52.0 ml LV SV MOD A4C:     144.0 ml LV SV MOD BP:      54.5 ml RIGHT VENTRICLE RV Basal diam:  3.72 cm RV S prime:     8.81 cm/s TAPSE (M-mode): 1.1 cm LEFT ATRIUM             Index        RIGHT ATRIUM           Index LA diam:        4.90 cm 2.42 cm/m  RA Area:     12.90 cm LA Vol (A2C):   81.7 ml 40.36 ml/m RA Volume:   26.40 ml  13.04 ml/m LA Vol (A4C):   60.8 ml 30.04 ml/m LA Biplane Vol: 75.3 ml 37.20 ml/m  AORTIC VALVE                   PULMONIC VALVE AV Area (Vmax):    1.68 cm    PV Vmax:        0.55 m/s AV Area (Vmean):   1.75 cm    PV Peak grad:   1.2 mmHg AV Area (VTI):     2.29 cm    RVOT Peak grad: 3 mmHg AV Vmax:           127.50 cm/s AV Vmean:          94.900 cm/s AV VTI:            0.196 m AV Peak Grad:      6.5 mmHg AV Mean Grad:      4.0 mmHg LVOT Vmax:         68.00 cm/s LVOT Vmean:        52.900 cm/s LVOT VTI:          0.143 m LVOT/AV VTI ratio: 0.73  AORTA Ao Root diam: 2.50 cm MITRAL VALVE  TRICUSPID VALVE MV Area (PHT): 5.62 cm     TR Peak grad:   53.6 mmHg MV Peak grad:  14.6 mmHg    TR Vmax:        366.00 cm/s MV Mean grad:  10.0 mmHg MV Vmax:       1.91 m/s     SHUNTS MV Vmean:      148.0 cm/s   Systemic VTI:  0.14 m MV Decel Time: 135 msec     Systemic Diam: 2.00 cm MV E velocity: 204.00 cm/s MV A velocity: 191.00 cm/s MV E/A ratio:  1.07 Christopher End MD Electronically signed by Nelva Bush MD Signature Date/Time: 03/31/2020/1:11:52 PM    Final     Blood pressure 123/87, pulse 95, temperature 98.8 F (37.1 C), temperature source Oral, resp. rate 18, height 5\' 9"  (1.753 m), weight 86.4 kg, SpO2 98 %.   Assessment 1. 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 Diabetes type 2 uncontrolled 3. PVD 4. Right ankle fracture, lateral malleolus, nondisplaced 5. Equinus bilateral status post tendo Achilles lengthening left.  Plan -Patient seen and examined. -Postoperative x-rays reviewed discussed with patient in detail. -Patient splint appears to be clean, dry, and intact today with no strikethrough.  Cast also appears  similarly. -Ordered PT consultation. -Patient may continue with blood thinner medications tomorrow as procedure sites appear to be stable with no bleeding to the bandage. -Recommend patient still maintain nonweightbearing to both feet at all times as she has the ankle fracture to the right side and is status post the transmetatarsal amputation with tendo Achilles lengthening on the left side due to infection. -Believe that all infectious processes were removed with amputation definitively.  Patient can be transitioned to oral antibiotics.  Per state recommendations from medicine.  Intraoperative culture and path report pending. -We will likely change the dressing once prior to the patient leaving but will follow more peripherally at this time.  Appropriate postoperative instructions placed in chart for follow-up.  Recommend continued DVT prophylaxis measures consisting of Eliquis 2.5 mg twice daily at discharge due to nonweightbearing status.  Caroline More, DPM 04/01/2020, 1:19 PM

## 2020-04-01 NOTE — Discharge Instructions (Signed)
Podiatry discharge instructions: 1.  Keep surgical dressings to left foot clean, dry, and intact.  Keep cast to the right lower extremity clean, dry, and intact. 2.  Try to maintain nonweightbearing at all times to both feet as you have an ankle fracture to the right side and since you are status post a transmetatarsal amputation of the left side with tendo Achilles lengthening.  If you put any weight on both feet it could cause either the fracture to displace on the right side which could require surgery or could cause the left foot incisions to open or caused the Achilles tendon rupture.  You may try to use the heels a little bit to transfer but would recommend trying to stay off the feet at all times. 3.  Take antibiotics as prescribed until gone. 4.  Recommend taking blood thinner medication postoperatively.

## 2020-04-01 NOTE — Evaluation (Signed)
Physical Therapy Evaluation Patient Details Name: [name] MRN: [id number] DOB: 12-11-1975 Today's Date: 04/01/2020   History of Present Illness  Pt is a 44 y.o. female presenting to hospital 6/7 with worsening infection L foot.  Pt admitted with osteomyelitis and cellulitis L 2nd and 3rd digits of the toes.  S/p L TMA and L tendo achilles lengthening (TAL) 6/8.  Of note, pt presented to outside ED 5/27 d/t R LE injury and imaging showing 5th metatarsal and fibula fx (currently in cast R ankle/foot).  D/t this, prior to hospitalization, pt was increasing pressure through L LE d/t NWB'ing R LE.  PMH includes bipolar disease, HF, COPD, substance use, tobacco use, htn, seizures, L ankle surgery, multiple L toe amputations (most recent April 2021).  Clinical Impression  Prior to hospital admission, pt was most recently performing w/c level transfers d/t recent R LE injury (SBA provided by pt's mom for safety); lives with her mom and mom's husband in 1 level home with ramp to enter.  Currently pt is modified independent with bed mobility and able to perform slide-board transfer bed to recliner with 1 assist for slideboard and pt safety monitoring and 2nd assist provided to hold L LE to make sure pt maintaining NWB'ing L LE (pt able to maintain R LE NWB'ing on own)--initial vc's and assist for slideboard use and then pt able to perform slide-board transfer with minimal further vc's.  8/10 L ankle/heel pain beginning/end of session (nurse notified pt requesting pain medication).  Pt would benefit from skilled PT to address noted impairments and functional limitations (see below for any additional details).  Pt has hoyer lift, slide-board, drop-arm BSC, and manual w/c (with removable armrests and elevating leg rests) at home; also reports her mom can assist her with transfers as needed.  Upon hospital discharge, pt would benefit from HHPT and 24/7 assist with functional mobility for safety.    Follow Up  Recommendations Home health PT;Supervision/Assistance - 24 hour    Equipment Recommendations  (pt has manual w/c and hoyer lift and slideboard at home already)    Recommendations for Other Services       Precautions / Restrictions Precautions Precautions: Fall Precaution Comments: Elevate L LE on 2 pillows Required Braces or Orthoses: Splint/Cast Splint/Cast: R LE in cast; L LE in splint Restrictions Weight Bearing Restrictions: Yes RLE Weight Bearing: Non weight bearing LLE Weight Bearing: Non weight bearing      Mobility  Bed Mobility Overal bed mobility: Independent             General bed mobility comments: Supine to/from sit without any noted difficulties  Transfers Overall transfer level: Needs assistance Equipment used: Sliding board Transfers: Lateral/Scoot Transfers          Lateral/Scoot Transfers: Min assist;With slide board;+2 safety/equipment General transfer comment: pt able to lateral scoot L and R on edge of bed a couple feet each direction modified independent (NWB'ing B LE's);  1 assist for slideboard and pt safety monitoring performing slideboard transfer bed to recliner towards R side--2nd assist to hold L LE to make sure pt maintaining NWB'ing L LE (pt able to maintain R LE NWB'ing on own)--initial vc's and assist for slideboard use and then pt able to perform lateral scoot with minimal further vc's  Ambulation/Gait                Stairs            Wheelchair Mobility    Modified Rankin (  Stroke Patients Only)       Balance Overall balance assessment: Needs assistance Sitting-balance support: No upper extremity supported;Feet unsupported Sitting balance-Leahy Scale: Normal Sitting balance - Comments: steady sitting reaching outside BOS       Standing balance comment: Deferred (pt NWB'ing B LE's)                             Pertinent Vitals/Pain Pain Assessment: 0-10 Pain Score: 8  Pain Location: L  ankle/heel Pain Descriptors / Indicators: Aching;Sore;Constant;Discomfort Pain Intervention(s): Limited activity within patient's tolerance;Monitored during session;Repositioned;Patient requesting pain meds-RN notified;Other (comment)(L LE elevated on 2 pillows end of session)    Home Living Family/patient expects to be discharged to:: Private residence Living Arrangements: Parent(pt's mom and mom's husband) Available Help at Discharge: Family;Available 24 hours/day Type of Home: House Home Access: Ramped entrance     Home Layout: One level Home Equipment: Walker - 4 wheels;Bedside commode;Shower seat;Wheelchair - manual Additional Comments: hoyer lift; slideboard; has drop-arm bedside commode; manual w/c has elevating legrests and removeable armrests    Prior Function Level of Independence: Needs assistance   Gait / Transfers Assistance Needed: most recently pt performing stand/squat pivot to manual w/c (pt's mom provided SBA for safety); modified independent with w/c mobility; pt was walking prior to fall injurying her R ankle end of May 2021  ADL's / Homemaking Assistance Needed: Mother assists with ADL's as needed        Hand Dominance        Extremity/Trunk Assessment   Upper Extremity Assessment Upper Extremity Assessment: Overall WFL for tasks assessed    Lower Extremity Assessment Lower Extremity Assessment: (at least 3/5 hip flexion and knee flexion/extension AROM)    Cervical / Trunk Assessment Cervical / Trunk Assessment: Normal  Communication   Communication: No difficulties  Cognition Arousal/Alertness: Awake/alert Behavior During Therapy: WFL for tasks assessed/performed Overall Cognitive Status: Within Functional Limits for tasks assessed                                        General Comments General comments (skin integrity, edema, etc.): R LE cast and L LE splint and dressings in place.  Pt agreeable to PT session.    Exercises   Slide-board transfer training; educated pt on [place] precautions   Assessment/Plan    PT Assessment Patient needs continued PT services  PT Problem List Decreased strength;Decreased activity tolerance;Decreased balance;Decreased mobility;Decreased knowledge of use of DME;Decreased knowledge of precautions;Pain;Decreased skin integrity       PT Treatment Interventions DME instruction;Functional mobility training;Therapeutic activities;Therapeutic exercise;Balance training;Patient/family education;Wheelchair mobility training    PT Goals (Current goals can be found in the Care Plan section)  Acute Rehab PT Goals Patient Stated Goal: to improve mobility PT Goal Formulation: With patient Time For Goal Achievement: 04/15/20 Potential to Achieve Goals: Good    Frequency 7X/week   Barriers to discharge        Co-evaluation               AM-PAC PT "6 Clicks" Mobility  Outcome Measure Help needed turning from your back to your side while in a flat bed without using bedrails?: None Help needed moving from lying on your back to sitting on the side of a flat bed without using bedrails?: None Help needed moving to and from a bed to a chair (  including a wheelchair)?: A Little Help needed standing up from a chair using your arms (e.g., wheelchair or bedside chair)?: Total Help needed to walk in hospital room?: Total Help needed climbing 3-5 steps with a railing? : Total 6 Click Score: 14    End of Session Equipment Utilized During Treatment: Gait belt Activity Tolerance: No increased pain Patient left: in chair;with call bell/phone within reach;with chair alarm set;Other (comment)(L LE elevated via 2 pillows) Nurse Communication: Mobility status;Precautions;Patient requests pain meds;Weight bearing status(verbal discussion (regarding pt's transfer style, assist needs, and [place] status) with pt's nurse and pt's NT; also written on white board) PT Visit Diagnosis: Other abnormalities  of gait and mobility (R26.89);History of falling (Z91.81);Muscle weakness (generalized) (M62.81);Pain Pain - Right/Left: Left Pain - part of body: Ankle and joints of foot    Time: 7639-4320 PT Time Calculation (min) (ACUTE ONLY): 34 min   Charges:   PT Evaluation $PT Eval Low Complexity: 1 Low PT Treatments $Therapeutic Activity: 8-22 mins       Leitha Bleak, PT 04/01/20, 5:17 PM

## 2020-04-01 NOTE — Anesthesia Postprocedure Evaluation (Signed)
Anesthesia Post Note  Patient: [name]  Procedure(s) Performed: TRANSMETATARSAL AMPUTATION (Left Foot) ACHILLES LENGTHENING (Left Leg Lower)  Patient location during evaluation: PACU Anesthesia Type: General Level of consciousness: awake and alert Pain management: pain level controlled Vital Signs Assessment: post-procedure vital signs reviewed and stable Respiratory status: spontaneous breathing, nonlabored ventilation and respiratory function stable Cardiovascular status: blood pressure returned to baseline and stable Postop Assessment: no apparent nausea or vomiting Anesthetic complications: no     Last Vitals:  Vitals:   04/01/20 0157 04/01/20 0316  BP: 100/78 108/85  Pulse: 91 95  Resp: 16 16  Temp: 36.8 C 36.8 C  SpO2: 97% 100%    Last Pain:  Vitals:   04/01/20 0038  TempSrc: Oral  PainSc:                  Tera Mater

## 2020-04-01 NOTE — Progress Notes (Signed)
PROGRESS NOTE    [name]  LAG:[id number] DOB: Oct 13, 1976 DOA: 03/30/2020 PCP: Care, [facility]   Chief Complaint  Patient presents with  . Wound Infection    Brief Narrative: 44 year old female with bipolar disorder, COPD, chronic mild systolic diastolic CHF, polysubstance abuse, diabetes mellitus, history of seizure disorder and hypertension presented with cellulitis and acute osteomyelitis of the left foot.    Assessment & Plan:  Principal problem Osteomyelitis of left second and third MTP joint (HCC) Left second and third toe ulcerations with necrosis. S/p left transmetatarsal amputation with tendo Achilles lengthening on 6/8.  Tolerated well. Continue empiric vancomycin and Rocephin.  Wound care consult.  Follow blood and surgical wound consult. PT evaluation.  Patient will need to be nonweightbearing on both legs.  Reports having a wheelchair at home.  Active Problems:    DM type 2 (diabetes mellitus, type 2)-controlled A1c of 7.3 (appears to be better from previous).  Stable on sliding scale coverage.  Fracture of right ankle Has right cast.  Apparently was putting increased pressure on that left leg due to nonweightbearing status on the right.  Chronic kidney disease stage IIIa ? Diabetic nephropathy. At baseline    Chronic combined systolic and diastolic CHF (congestive heart failure) (HCC) Currently euvolemic.  Continue home meds.  (Metoprolol, lasix and statin)  COPD Stable.  Continue home inhaler  History of seizure disorder Stable.  Not on any meds  Hx of bipolar dx Continue effexor and seroquel    Pressure injury of skin Care per nursing     DVT prophylaxis: lovenox Code Status: full code Family Communication:none Disposition:   Status is: Inpatient  Remains inpatient appropriate because:IV treatments appropriate due to intensity of illness or inability to take PO   Dispo: The patient is from: Home              Anticipated d/c  is to: Home              Anticipated d/c date is: 2 days              Patient currently is not medically stable to d/c.       Consultants:   podiatry   Procedures: TMT of left toe   Antimicrobials:IV vanco and rocephin   Subjective: Reports some pain over left foot  Objective: Vitals:   04/01/20 0059 04/01/20 0157 04/01/20 0316 04/01/20 0810  BP: 94/77 100/78 108/85 112/84  Pulse: 87 91 95 (!) 102  Resp: 16 16 16 18   Temp: 98.1 F (36.7 C) 98.2 F (36.8 C) 98.3 F (36.8 C) 98.5 F (36.9 C)  TempSrc:    Oral  SpO2: 100% 97% 100% 99%  Weight:      Height:        Intake/Output Summary (Last 24 hours) at 04/01/2020 1011 Last data filed at 03/31/2020 2230 Gross per 24 hour  Intake 850 ml  Output 0 ml  Net 850 ml   Filed Weights   03/30/20 1232 03/31/20 0209  Weight: 88.5 kg 86.4 kg    Examination:  NAD HEENT: moist mucosa Chest: clear b/l  CVS: NS1&S2 Abdomen: soft, NT, ND Musculoskeletal: dressing with ACE wrap over left foot, cast over rt foot   Data Reviewed: I have personally reviewed following labs and imaging studies  CBC: Recent Labs  Lab 03/30/20 1247 03/31/20 0444  WBC 7.7 7.3  NEUTROABS 5.9  --   HGB 11.6* 10.9*  HCT 34.9* 33.2*  MCV 87.0 88.5  PLT 475* 464*    Basic Metabolic Panel: Recent Labs  Lab 03/30/20 1247 03/31/20 0444 04/01/20 0435  NA 135 138 140  K 3.5 3.6 3.9  CL 100 103 106  CO2 26 27 26   GLUCOSE 236* 178* 179*  BUN 15 15 18   CREATININE 1.62* 1.53* 1.53*  CALCIUM 8.8* 8.4* 8.7*    GFR: Estimated Creatinine Clearance: 55 mL/min (A) (by C-G formula based on SCr of 1.53 mg/dL (H)).  Liver Function Tests: Recent Labs  Lab 03/30/20 1247  AST 19  ALT 23  ALKPHOS 88  BILITOT 0.6  PROT 7.8  ALBUMIN 3.2*    CBG: Recent Labs  Lab 03/31/20 1618 03/31/20 2147 03/31/20 2305 04/01/20 0325 04/01/20 0747  GLUCAP 175* 160* 197* 157* 137*     Recent Results (from the past 240 hour(s))  Culture, blood  (Routine x 2)     Status: None (Preliminary result)   Collection Time: 03/30/20 12:47 PM   Specimen: BLOOD  Result Value Ref Range Status   Specimen Description BLOOD RIGHT ANTECUBITAL  Final   Special Requests   Final    BOTTLES DRAWN AEROBIC AND ANAEROBIC Blood Culture adequate volume   Culture   Final    NO GROWTH 2 DAYS Performed at [facility], [address] [place], [place] [address]    Report Status PENDING  Incomplete  Culture, blood (Routine x 2)     Status: None (Preliminary result)   Collection Time: 03/30/20  5:40 PM   Specimen: BLOOD  Result Value Ref Range Status   Specimen Description BLOOD BLOOD RIGHT FOREARM  Final   Special Requests   Final    BOTTLES DRAWN AEROBIC AND ANAEROBIC Blood Culture results may not be optimal due to an inadequate volume of blood received in culture bottles   Culture   Final    NO GROWTH 2 DAYS Performed at [facility], [address] [place], [place] [address]    Report Status PENDING  Incomplete  SARS Coronavirus 2 by RT PCR (hospital order, performed in [place] hospital lab) Nasopharyngeal Nasopharyngeal Swab     Status: None   Collection Time: 03/30/20  5:41 PM   Specimen: Nasopharyngeal Swab  Result Value Ref Range Status   SARS Coronavirus 2 NEGATIVE NEGATIVE Final    Comment: (NOTE) SARS-CoV-2 target nucleic acids are NOT DETECTED. The SARS-CoV-2 RNA is generally detectable in upper and lower respiratory specimens during the acute phase of infection. The lowest concentration of SARS-CoV-2 viral copies this assay can detect is 250 copies / mL. A negative result does not preclude SARS-CoV-2 infection and should not be used as the sole basis for treatment or other patient management decisions.  A negative result may occur with improper specimen collection / handling, submission of specimen other than nasopharyngeal swab, presence of viral mutation(s) within the areas targeted by this assay, and  inadequate number of viral copies (<250 copies / mL). A negative result must be combined with clinical observations, patient history, and epidemiological information. Fact Sheet for Patients:   StrictlyIdeas.no Fact Sheet for Healthcare Providers: BankingDealers.co.za This test is not yet approved or cleared  by the [place] FDA and has been authorized for detection and/or diagnosis of SARS-CoV-2 by FDA under an Emergency Use Authorization (EUA).  This EUA will remain in effect (meaning this test can be used) for the duration of the COVID-19 declaration under Section 564(b)(1) of the Act, 21 U.S.C. section 360bbb-3(b)(1), unless the authorization is terminated  or revoked sooner. Performed at [facility], [address] [place], [place] [address]   Aerobic/Anaerobic Culture (surgical/deep wound)     Status: None (Preliminary result)   Collection Time: 03/30/20 10:05 PM   Specimen: Wound  Result Value Ref Range Status   Specimen Description   Final    WOUND Performed at [facility], [address] [place], [place] [address]    Special Requests   Final    NONE Performed at [facility], [address] [place], [place] [address]    Gram Stain   Final    NO WBC SEEN RARE GRAM POSITIVE COCCI IN PAIRS Performed at [facility], [place] [address], [place], [place] [address]    Culture PENDING  Incomplete   Report Status PENDING  Incomplete  Surgical pcr screen     Status: None   Collection Time: 03/30/20 10:05 PM   Specimen: Nasal Mucosa; Nasal Swab  Result Value Ref Range Status   MRSA, PCR NEGATIVE NEGATIVE Final   Staphylococcus aureus NEGATIVE NEGATIVE Final    Comment: (NOTE) The Xpert SA Assay (FDA approved for NASAL specimens in patients 82 years of age and older), is one component of a comprehensive surveillance program. It is not intended to diagnose infection nor to guide or  monitor treatment. Performed at [facility], [address] [place],  [address]          Radiology Studies: DG Foot Complete Left  Result Date: 03/31/2020 CLINICAL DATA:  Postoperative EXAM: LEFT FOOT - COMPLETE 3+ VIEW COMPARISON:  Radiograph 03/30/2020 FINDINGS: There are expected postsurgical changes following transmetatarsal amputation across all 5 rays including progressive resection of the first metatarsal now truncated at the level of the proximal metadiaphysis. Overlying swelling, gas and skin staples are expected in the postoperative state. Postsurgical changes from prior ORIF of the distal tibia and fibula without acute complication. Midfoot and hindfoot alignment is grossly preserved though incompletely assessed on nonweightbearing films. Casting material may obscure some fine bone and soft-tissue detail. IMPRESSION: Expected postsurgical changes following transmetatarsal amputation across all 5 rays including progressive resection of the first metatarsal now truncated at the level of the proximal metadiaphysis. Electronically Signed   By: Lovena Le M.D.   On: 03/31/2020 23:12   DG Foot Complete Left  Result Date: 03/30/2020 CLINICAL DATA:  Infection along the surgical site fall left foot amputation. EXAM: LEFT FOOT - COMPLETE 3+ VIEW COMPARISON:  01/25/2020 FINDINGS: There has been a previous amputation of the fifth and first toes, as well as the distal first metatarsal and the fourth toe from distal aspect the proximal phalanx. Second toe proximal phalanx is dislocated dorsal to the second metatarsal head. There is bone resorption along plantar and medial aspect of the base of the second toe proximal phalanx. There is a smaller more subtle area of resorption along the plantar base of the proximal phalanx of the third toe. The amputated margins of the distal proximal phalanx of the fourth toe and first metatarsal are well-defined. There is forefoot soft tissue  swelling.  No soft tissue air. IMPRESSION: 1. Findings consistent with osteomyelitis of the plantar base of the proximal phalanx of the second toe and possibly of the plantar base of the proximal phalanx of the third toe. No other evidence of osteomyelitis. 2. Chronic dislocation of the proximal phalanx of the second toe dorsal to the second metatarsal head. 3. Previous amputations as described stable from the prior study. 4.  Diffuse forefoot soft tissue swelling.  No soft tissue air. Electronically Signed   By: Lajean Manes M.D.   On: 03/30/2020 13:34   ECHOCARDIOGRAM COMPLETE  Result Date: 03/31/2020    ECHOCARDIOGRAM REPORT   Patient Name:   [name] Date of Exam: 03/31/2020 Medical Rec #:  [id number]       Height:       69.0 in Accession #:    [id number]      Weight:       190.6 lb Date of Birth:  1976-06-01       BSA:          2.024 m Patient Age:    25 years        BP:           97/70 mmHg Patient Gender: F               HR:           93 bpm. Exam Location:  [facility] Procedure: 2D Echo, Cardiac Doppler, Color Doppler and Intracardiac            Opacification Agent Indications:     Preoperative evaluation  History:         Patient has prior history of Echocardiogram examinations, most                  recent 03/26/2019. COPD; Risk Factors:Hypertension and Diabetes.  Sonographer:     Sherrie Sport RDCS (AE) Referring Phys:  [id number] Harvie Bridge Diagnosing Phys: Harrell Gave End MD  Sonographer Comments: Suboptimal apical window. Image acquisition challenging due to COPD. IMPRESSIONS  1. Left ventricular ejection fraction, by estimation, is 40 to 45%. The left ventricle has mildly decreased function. The left ventricle demonstrates global hypokinesis. The left ventricular internal cavity size was mildly dilated. There is mild left ventricular hypertrophy. Indeterminate diastolic filling due to E-A fusion.  2. Right ventricular systolic function is moderately reduced. The right ventricular size is normal. There  is moderately elevated pulmonary artery systolic pressure.  3. Left atrial size was mildly dilated.  4. Degree mitral stenosis may be overestimated by increased transmitral flow in the setting of severe mitral regurgitation. 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 Moderate to severe mitral stenosis.  5. Tricuspid valve regurgitation is mild to moderate.  6. The aortic valve is tricuspid. Aortic valve regurgitation is trivial. No aortic stenosis is present.  7. The inferior vena cava is normal in size with <50% respiratory variability, suggesting right atrial pressure of 8 mmHg. FINDINGS  Left Ventricle: Left ventricular ejection fraction, by estimation, is 40 to 45%. The left ventricle has mildly decreased function. The left ventricle demonstrates global hypokinesis. Definity contrast agent was given IV to delineate the left ventricular  endocardial borders. The left ventricular internal cavity size was mildly dilated. There is mild left ventricular hypertrophy. Indeterminate diastolic filling due to E-A fusion. Right Ventricle: The right ventricular size is normal. No increase in right ventricular wall thickness. Right ventricular systolic function is moderately reduced. There is moderately elevated pulmonary artery systolic pressure. The tricuspid regurgitant velocity is 3.66 m/s, and with an assumed right atrial pressure of 8 mmHg, the estimated right ventricular systolic pressure is 34.1 mmHg. Left Atrium: Left atrial size was mildly dilated. Right Atrium: Right atrial size was normal in size. Pericardium: There is no evidence of pericardial effusion. Mitral Valve: Degree mitral stenosis may be overestimated by increased transmitral flow in the setting of severe mitral  regurgitation. The mitral valve is rheumatic. There is mild thickening of the mitral valve leaflet(s). Severe mitral valve regurgitation. Moderate to severe mitral valve stenosis. MV peak gradient, 14.6 mmHg. The mean  mitral valve gradient is 10.0 mmHg. Tricuspid Valve: The tricuspid valve is normal in structure. Tricuspid valve regurgitation is mild to moderate. Aortic Valve: The aortic valve is tricuspid. Aortic valve regurgitation is trivial. No aortic stenosis is present. Aortic valve mean gradient measures 4.0 mmHg. Aortic valve peak gradient measures 6.5 mmHg. Aortic valve area, by VTI measures 2.29 cm. Pulmonic Valve: The pulmonic valve was not well visualized. Pulmonic valve regurgitation is trivial. No evidence of pulmonic stenosis. Aorta: The aortic root is normal in size and structure. Pulmonary Artery: The pulmonary artery is not well seen. Venous: The inferior vena cava is normal in size with less than 50% respiratory variability, suggesting right atrial pressure of 8 mmHg. IAS/Shunts: The interatrial septum was not well visualized.  LEFT VENTRICLE PLAX 2D LVIDd:         5.25 cm      Diastology LVIDs:         4.43 cm      LV e' lateral:   5.22 cm/s LV PW:         1.08 cm      LV E/e' lateral: 39.1 LV IVS:        1.29 cm      LV e' medial:    7.62 cm/s LVOT diam:     2.00 cm      LV E/e' medial:  26.8 LV SV:         45 LV SV Index:   22 LVOT Area:     3.14 cm  LV Volumes (MOD) LV vol d, MOD A2C: 158.0 ml LV vol d, MOD A4C: 144.0 ml LV vol s, MOD A2C: 106.0 ml LV vol s, MOD A4C: 85.8 ml LV SV MOD A2C:     52.0 ml LV SV MOD A4C:     144.0 ml LV SV MOD BP:      54.5 ml RIGHT VENTRICLE RV Basal diam:  3.72 cm RV S prime:     8.81 cm/s TAPSE (M-mode): 1.1 cm LEFT ATRIUM             Index       RIGHT ATRIUM           Index LA diam:        4.90 cm 2.42 cm/m  RA Area:     12.90 cm LA Vol (A2C):   81.7 ml 40.36 ml/m RA Volume:   26.40 ml  13.04 ml/m LA Vol (A4C):   60.8 ml 30.04 ml/m LA Biplane Vol: 75.3 ml 37.20 ml/m  AORTIC VALVE                   PULMONIC VALVE AV Area (Vmax):    1.68 cm    PV Vmax:        0.55 m/s AV Area (Vmean):   1.75 cm    PV Peak grad:   1.2 mmHg AV Area (VTI):     2.29 cm    RVOT Peak  grad: 3 mmHg AV Vmax:           127.50 cm/s AV Vmean:          94.900 cm/s AV VTI:            0.196 m AV Peak Grad:      6.5  mmHg AV Mean Grad:      4.0 mmHg LVOT Vmax:         68.00 cm/s LVOT Vmean:        52.900 cm/s LVOT VTI:          0.143 m LVOT/AV VTI ratio: 0.73  AORTA Ao Root diam: 2.50 cm MITRAL VALVE                TRICUSPID VALVE MV Area (PHT): 5.62 cm     TR Peak grad:   53.6 mmHg MV Peak grad:  14.6 mmHg    TR Vmax:        366.00 cm/s MV Mean grad:  10.0 mmHg MV Vmax:       1.91 m/s     SHUNTS MV Vmean:      148.0 cm/s   Systemic VTI:  0.14 m MV Decel Time: 135 msec     Systemic Diam: 2.00 cm MV E velocity: 204.00 cm/s MV A velocity: 191.00 cm/s MV E/A ratio:  1.07 Harrell Gave End MD Electronically signed by Nelva Bush MD Signature Date/Time: 03/31/2020/1:11:52 PM    Final         Scheduled Meds: . amitriptyline  150 mg Oral QHS  . atorvastatin  20 mg Oral QHS  . bethanechol  25 mg Oral BID  . furosemide  20 mg Oral Daily  . heparin  5,000 Units Subcutaneous Q8H  . hydrOXYzine  50 mg Oral BID  . insulin aspart  0-15 Units Subcutaneous Q4H  . metoprolol tartrate  25 mg Oral BID  . multivitamin with minerals  1 tablet Oral Daily  . pantoprazole  20 mg Oral Daily  . prazosin  5 mg Oral QHS  . Ensure Max Protein  11 oz Oral BID BM  . QUEtiapine  800 mg Oral QHS  . tamsulosin  0.4 mg Oral BID  . venlafaxine XR  150 mg Oral QHS   Continuous Infusions: . cefTRIAXone (ROCEPHIN)  IV 2 g (04/01/20 0829)  . vancomycin 750 mg (04/01/20 0915)     LOS: 2 days    Time spent: 32 minutes   Nishant Dhungel, MD [facility]   To contact the attending provider between 7A-7P or the covering provider during after hours 7P-7A, please log into the web site [email or web address] and access using universal [place] password for that web site. If you do not have the password, please call the hospital operator.  04/01/2020, 10:11 AM

## 2020-04-01 NOTE — TOC Initial Note (Signed)
Transition of Care [facility]) - Initial/Assessment Note    Patient Details  Name: [name] MRN: [id number] Date of Birth: Feb 06, 1976  Transition of Care [facility]) CM/SW Contact:    Elease Hashimoto, LCSW Phone Number: 04/01/2020, 12:34 PM  Clinical Narrative: Met with pt and Mom who is at the bedside to discuss discharge needs. Pt was wheelchair prior to admission and the home is wheelchair accessible. She is aware she can not put weight on her surgical foot and her other foot is in a cast but can pivot on this. Discussed PT will be coming in to see how she transfers. She and Mom feel she can manage with her and step father's help. She was quite sedentary prior to admission and used a wheelchair and bedside commode prior to this surgery. Her Mom drives her to appointments, she has a PCP and no other issues. She has had home health in the past via [facility] and [facility]. Will await PT eval and work on discharge needs. May be difficult to get home health due to [facility]. Has all needed equipment               Expected Discharge Plan: [place] Barriers to Discharge: Continued Medical Work up   Patient Goals and CMS Choice Patient states their goals for this hospitalization and ongoing recovery are:: I plan to go home and manage until one of theses come soff-cast or dressing. CMS Medicare.gov Compare Post Acute Care list provided to:: Patient Choice offered to / list presented to : Patient  Expected Discharge Plan and Services Expected Discharge Plan: [place] In-house Referral: Clinical Social Work     Living arrangements for the past 2 months: Mobile Home                                      Prior Living Arrangements/Services Living arrangements for the past 2 months: Mobile Home Lives with:: Parents(Stepfather is there also) Patient language and need for interpreter reviewed:: No Do you feel safe going back to the place  where you live?: Yes      Need for Family Participation in Patient Care: Yes (Comment) Care giver support system in place?: Yes (comment) Current home services: DME(has rw, bsc and wheelchair from previous admits) Criminal Activity/Legal Involvement Pertinent to Current Situation/Hospitalization: No - Comment as needed  Activities of Daily Living Home Assistive Devices/Equipment: [employer] fast streamer, Wheelchair, Shower chair with back, Bedside commode/3-in-1 ADL Screening (condition at time of admission) Patient's cognitive ability adequate to safely complete daily activities?: Yes Is the patient deaf or have difficulty hearing?: No Does the patient have difficulty seeing, even when wearing glasses/contacts?: No Does the patient have difficulty concentrating, remembering, or making decisions?: Yes Patient able to express need for assistance with ADLs?: Yes Does the patient have difficulty dressing or bathing?: Yes Independently performs ADLs?: No Communication: Needs assistance Is this a change from baseline?: Pre-admission baseline Dressing (OT): Needs assistance Is this a change from baseline?: Pre-admission baseline Grooming: Needs assistance Is this a change from baseline?: Pre-admission baseline Feeding: Independent Bathing: Needs assistance Is this a change from baseline?: Pre-admission baseline Toileting: Needs assistance Is this a change from baseline?: Pre-admission baseline In/Out Bed: Needs assistance Is this a change from baseline?: Pre-admission baseline Walks in Home: Needs assistance Is this a change from baseline?: Pre-admission baseline Does the patient  have difficulty walking or climbing stairs?: Yes Weakness of Legs: Both Weakness of Arms/Hands: None  Permission Sought/Granted Permission sought to share information with : Family Supports Permission granted to share information with : Yes, Verbal Permission Granted  Share Information with NAME: [name]     Permission  granted to share info w Relationship: Mom     Emotional Assessment Appearance:: Appears older than stated age Attitude/Demeanor/Rapport: Engaged Affect (typically observed): Accepting, Restless Orientation: : Oriented to Self, Oriented to Place, Oriented to  Time, Oriented to Situation Alcohol / Substance Use: Tobacco Use Psych Involvement: No (comment)  Admission diagnosis:  Osteomyelitis of left foot (HCC) [M86.9] Cellulitis of left foot [L03.116] Other osteomyelitis of left foot [facility]) [M86.8X7] Patient Active Problem List   Diagnosis Date Noted  . Pressure injury of skin 04/01/2020  . Osteomyelitis of left foot ([place]) 03/30/2020  . Osteomyelitis of fifth toe of left foot ([place]) 08/15/2019  . Bacteremia   . HTN (hypertension) 03/21/2019  . Chronic combined systolic and diastolic CHF (congestive heart failure) ([place]) 03/21/2019  . Toe osteomyelitis, left ([place]) 03/21/2019  . Sepsis ([place]) 03/21/2019  . Atherosclerosis of native arteries of the extremities with ulceration ([place]) 03/04/2019  . Ulcer of great toe, left, with fat layer exposed ([place]) 09/28/2018  . Menometrorrhagia 06/29/2018  . 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 Secondary localized osteoarthrosis of ankle and foot 01/10/2018  . Chronic foot pain, left 02/20/2017  . Leg edema, left 02/20/2017  . 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 Urinary retention with incomplete bladder emptying 05/02/2014  . 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 Cocaine abuse ([place]) 04/23/2014  . Bipolar disorder, unspecified ([place]) 04/23/2014  . Morbid obesity ([place]) 04/23/2014  . DM type 2 (diabetes mellitus, type 2)- poorly controlled 04/23/2014  . Toxic encephalopathy 04/20/2014  . 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 Lactic acid acidosis 04/20/2014  . 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 HCAP (healthcare-associated pneumonia) 04/20/2014  . Post-traumatic stress disorder, unspecified  03/20/2014  . Severe cannabis use disorder ([place]) 03/20/2014   PCP:  Care, [facility] Pharmacy:   [facility] [address] [place] [address] Phone: [phone] Fax: [place], [place] [address] Phone: [phone] Fax: [place], [place] - [address] AT [place] [address] [place] [address] Phone: [phone] Fax: [phone]  [place], [place] - [place] [address] Phone: [phone] Fax: [phone]     Social Determinants of Health (SDOH) Interventions    Readmission Risk Interventions No flowsheet data found.

## 2020-04-02 DIAGNOSIS — L03116 Cellulitis of left lower limb: Secondary | ICD-10-CM

## 2020-04-02 LAB — CBC
HCT: 31.7 % — ABNORMAL LOW (ref 36.0–46.0)
Hemoglobin: 10.1 g/dL — ABNORMAL LOW (ref 12.0–15.0)
MCH: 28.9 pg (ref 26.0–34.0)
MCHC: 31.9 g/dL (ref 30.0–36.0)
MCV: 90.8 fL (ref 80.0–100.0)
Platelets: 427 10*3/uL — ABNORMAL HIGH (ref 150–400)
RBC: 3.49 MIL/uL — ABNORMAL LOW (ref 3.87–5.11)
RDW: 14.3 % (ref 11.5–15.5)
WBC: 8.5 10*3/uL (ref 4.0–10.5)
nRBC: 0 % (ref 0.0–0.2)

## 2020-04-02 LAB — GLUCOSE, CAPILLARY
Glucose-Capillary: 192 mg/dL — ABNORMAL HIGH (ref 70–99)
Glucose-Capillary: 166 mg/dL — ABNORMAL HIGH (ref 70–99)
Glucose-Capillary: 199 mg/dL — ABNORMAL HIGH (ref 70–99)
Glucose-Capillary: 135 mg/dL — ABNORMAL HIGH (ref 70–99)

## 2020-04-02 LAB — CREATININE, SERUM
Creatinine, Ser: 1.41 mg/dL — ABNORMAL HIGH (ref 0.44–1.00)
GFR calc Af Amer: 52 mL/min — ABNORMAL LOW (ref >60)
GFR calc non Af Amer: 45 mL/min — ABNORMAL LOW (ref >60)

## 2020-04-02 LAB — SURGICAL PATHOLOGY

## 2020-04-02 MED ORDER — APIXABAN 2.5 MG PO TABS
2.5000 mg | ORAL_TABLET | Freq: Two times a day (BID) | ORAL | Status: DC
  Administered 2020-04-02: 2.5 mg via ORAL
  Filled 2020-04-02: qty 1

## 2020-04-02 MED ORDER — ACETAMINOPHEN 325 MG PO TABS: 650.0000 mg | ORAL_TABLET | Freq: Four times a day (QID) | ORAL | 0 refills | Status: AC | PRN

## 2020-04-02 MED ORDER — ELIQUIS 2.5 MG PO TABS: 2.5000 mg | ORAL_TABLET | Freq: Two times a day (BID) | ORAL | 0 refills | Status: DC

## 2020-04-02 MED ORDER — AMOXICILLIN-POT CLAVULANATE 875-125 MG PO TABS
1.0000 | ORAL_TABLET | Freq: Two times a day (BID) | ORAL | 0 refills | Status: AC
Start: 2020-04-02 — End: 2020-04-09

## 2020-04-02 MED ORDER — SENNOSIDES-DOCUSATE SODIUM 8.6-50 MG PO TABS: 1.0000 | ORAL_TABLET | Freq: Every evening | ORAL | 0 refills | Status: DC | PRN

## 2020-04-02 NOTE — Progress Notes (Signed)
Pt's ride present for pt discharge at the [place] entrance; pt discharged via wheelchair by nursing to the [place] entrance

## 2020-04-02 NOTE — TOC Transition Note (Signed)
Transition of Care (TOC) - CM/SW Discharge Note   Patient Details  Name: [name] MRN: [id number] Date of Birth: 05/28/1976  Transition of Care (TOC) CM/SW Contact:  ,  G, LCSW Phone Number: 04/02/2020, 12:59 PM   Clinical Narrative:   Met with pt to discuss difficulty getting home health RN to see due to medicaid coverage. She voiced: " I am not surprised, my Mom can do the dressing changes." Bedside RN to show Mom when gets here dressing changes. She will follow up with [facility] like she has before when she is ready to WB. Aware MD will let her know when she will be ready for this. All DC needs met and pt very ready to go home. No further follow due to DC today.    Final next level of care: Home/Self Care Barriers to Discharge: Barriers Resolved   Patient Goals and CMS Choice Patient states their goals for this hospitalization and ongoing recovery are:: I plan to go home and manage until one of theses come soff-cast or dressing. CMS Medicare.gov Compare Post Acute Care list provided to:: Patient Choice offered to / list presented to : Patient  Discharge Placement                Patient to be transferred to facility by: Mom to take home via car Name of family member notified: Cindy-mom Patient and family notified of of transfer: 04/02/20  Discharge Plan and Services In-house Referral: Clinical Social Work                                   Social Determinants of Health (SDOH) Interventions     Readmission Risk Interventions No flowsheet data found.     

## 2020-04-02 NOTE — Progress Notes (Signed)
Ch visited with Pt as part of regular rounding. Pt was on the phone with her mother when Ch walked in. Pt told Ch that is going to be going home today. Pt did not want any other support from Ch at this time.

## 2020-04-02 NOTE — Discharge Summary (Signed)
Physician Discharge Summary  [name] NGE:[id number] DOB: 1976-06-26 DOA: 03/30/2020  PCP: [facility]  Admit date: 03/30/2020 Discharge date: 04/02/2020  dmitted From: Home Disposition: Home  Recommendations for Outpatient Follow-up:  Follow-up with PCP and podiatry and 2 weeks.  Follow-up with orthopedics as scheduled. Outpatient PT once able to bear weight. Patient will be discharged on 7 more days of oral Augmentin until 6/17. Patient is recommended for nonweightbearing on left foot until seen by podiatry as outpatient..  She will be on low-dose Eliquis for DVT prophylaxis while nonweightbearing.   Home Health: None Equipment/Devices: Ace wrap over left foot.  Has wheelchair at home.  Discharge Condition: Fair CODE STATUS: Full code Diet recommendation: Heart healthy/carb modified   Discharge Diagnoses:  Principal Problem:   Osteomyelitis of left foot ([place])  Active Problems:   DM type 2 (diabetes mellitus, type 2)- poorly controlled   Bipolar disorder, unspecified ([place]) Obesity ([place])   HTN (hypertension)   Chronic combined systolic and diastolic CHF (congestive heart failure) (HCC)   Pressure injury of skin Cellulitis of left foot  Brief narrative/HPI  44 year old female with bipolar disorder, COPD, chronic mild systolic diastolic CHF, polysubstance abuse, diabetes mellitus, history of seizure disorder and hypertension presented with cellulitis and acute osteomyelitis of the left foot.  Hospital course  Principal problem Osteomyelitis of left second and third MTP joint ([place]) Left second and third toe ulcerations with necrosis. S/p left transmetatarsal amputation with tendo Achilles lengthening on 6/8.  Tolerated well. Received empiric vancomycin and Rocephin.  Blood cultures negative.  Wound culture growing MSSA (pansensitive except for tetracycline).  Per podiatry had clear margin with surgery.  We will discharge her on oral Augmentin for 7 more  days.  (Will receive total 10 days of antibiotic). Has not required any pain medication for the foot except for Tylenol as needed.  Patient will need to be nonweightbearing in both legs.  Has wheelchair at home and will follow with outpatient PT once able to bear weight.  Wound dressing with Betadine at home.  Since patient will be nonweightbearing for?  Few weeks will place her on Eliquis 5 mg twice daily for DVT prophylaxis.  Follow-up with podiatry in 1-2 weeks.  Active Problems:    DM type 2 (diabetes mellitus, type 2)-controlled A1c of 7.3 (appears to be better from previous).    Continue Lantus and Metformin.   Fracture of right ankle Has right cast.  Apparently was putting increased pressure on that left leg due to nonweightbearing status on the right.  Follow-up with orthopedics as scheduled.  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  At baseline    Chronic combined systolic and diastolic CHF (congestive heart failure) (HCC) Currently euvolemic.  Continue home meds.  (Metoprolol, lasix and statin)  COPD Stable.  Continue home inhaler  History of seizure disorder Stable.  Not on any meds  Hx of bipolar dx Continue effexor and seroquel    Pressure injury of skin Care per nursing   Procedure: Transmetatarsal amputation of the left toe Consult: Dr. Luana Shu (podiatry) Family communication: Mother involved in care   Discharge Instructions   Allergies as of 04/02/2020      Reactions   Albumin (human)    unknown   Erythromycin Rash   Midecamycin Rash      Medication List    TAKE these medications   acetaminophen 325 MG tablet Commonly known as: TYLENOL Take 2 tablets (650 mg total) by mouth  every 6 (six) hours as needed for mild pain (or Fever >/= 101).   amitriptyline 150 MG tablet Commonly known as: ELAVIL Take 150 mg by mouth at bedtime.   amoxicillin-clavulanate 875-125 MG tablet Commonly known as:  Augmentin Take 1 tablet by mouth 2 (two) times daily for 7 days.   atorvastatin 20 MG tablet Commonly known as: LIPITOR Take 20 mg by mouth at bedtime.   bethanechol 25 MG tablet Commonly known as: URECHOLINE Take 25 mg by mouth 2 (two) times daily.   Eliquis 2.5 MG Tabs tablet Generic drug: apixaban Take 2.5 mg by mouth 2 (two) times daily.   furosemide 40 MG tablet Commonly known as: LASIX Take 40 mg by mouth daily.   gabapentin 600 MG tablet Commonly known as: NEURONTIN Take 600 mg by mouth 3 (three) times daily.   glipiZIDE 10 MG tablet Commonly known as: GLUCOTROL Take 10 mg by mouth daily.   hydrOXYzine 50 MG tablet Commonly known as: ATARAX/VISTARIL Take 50 mg by mouth 3 (three) times daily as needed for anxiety or itching.   insulin aspart 100 UNIT/ML injection Commonly known as: novoLOG Inject 1 Units into the skin 3 (three) times daily before meals.   insulin glargine 100 UNIT/ML injection Commonly known as: LANTUS Inject 30 Units into the skin 2 (two) times a day.   metFORMIN 1000 MG tablet Commonly known as: GLUCOPHAGE Take 1,000 mg by mouth 2 (two) times daily with a meal.   metoprolol tartrate 50 MG tablet Commonly known as: LOPRESSOR Take 25 mg by mouth 2 (two) times daily.   mirtazapine 15 MG tablet Commonly known as: REMERON Take 7.5-15 mg by mouth at bedtime.   pantoprazole 20 MG tablet Commonly known as: PROTONIX Take 20 mg by mouth daily.   prazosin 5 MG capsule Commonly known as: MINIPRESS Take 5 mg by mouth at bedtime.   promethazine 12.5 MG tablet Commonly known as: PHENERGAN Take 12.5 mg by mouth every 6 (six) hours as needed for nausea or vomiting.   QUEtiapine 400 MG tablet Commonly known as: SEROQUEL Take 800 mg by mouth at bedtime.   senna-docusate 8.6-50 MG tablet Commonly known as: Senokot-S Take 1 tablet by mouth at bedtime as needed for mild constipation.   tamsulosin 0.4 MG Caps capsule Commonly known as:  FLOMAX Take 0.4 mg by mouth daily.   venlafaxine XR 150 MG 24 hr capsule Commonly known as: EFFEXOR-XR Take 150 mg by mouth at bedtime.       Follow-up Information    Caroline More, DPM. Schedule an appointment as soon as possible for a visit in 1 week(s).   Specialty: Podiatry Contact information: [place] [address] [phone]        [facility]. Schedule an appointment as soon as possible for a visit in 2 week(s).   Specialty: Family Medicine Contact information: [place] [address] [phone]              Allergies  Allergen Reactions  . Albumin (Human)     unknown  . Erythromycin Rash  . Midecamycin Rash     Procedures/Studies: DG Foot Complete Left  Result Date: 03/31/2020 CLINICAL DATA:  Postoperative EXAM: LEFT FOOT - COMPLETE 3+ VIEW COMPARISON:  Radiograph 03/30/2020 FINDINGS: There are expected postsurgical changes following transmetatarsal amputation across all 5 rays including progressive resection of the first metatarsal now truncated at the level of the proximal metadiaphysis. Overlying swelling, gas and skin staples  are expected in the postoperative state. Postsurgical changes from prior ORIF of the distal tibia and fibula without acute complication. Midfoot and hindfoot alignment is grossly preserved though incompletely assessed on nonweightbearing films. Casting material may obscure some fine bone and soft-tissue detail. IMPRESSION: Expected postsurgical changes following transmetatarsal amputation across all 5 rays including progressive resection of the first metatarsal now truncated at the level of the proximal metadiaphysis. Electronically Signed   By: Lovena Le M.D.   On: 03/31/2020 23:12   DG Foot Complete Left  Result Date: 03/30/2020 CLINICAL DATA:  Infection along the surgical site fall left foot amputation. EXAM: LEFT FOOT - COMPLETE 3+ VIEW COMPARISON:  01/25/2020 FINDINGS: There  has been a previous amputation of the fifth and first toes, as well as the distal first metatarsal and the fourth toe from distal aspect the proximal phalanx. Second toe proximal phalanx is dislocated dorsal to the second metatarsal head. There is bone resorption along plantar and medial aspect of the base of the second toe proximal phalanx. There is a smaller more subtle area of resorption along the plantar base of the proximal phalanx of the third toe. The amputated margins of the distal proximal phalanx of the fourth toe and first metatarsal are well-defined. There is forefoot soft tissue swelling.  No soft tissue air. IMPRESSION: 1. Findings consistent with osteomyelitis of the plantar base of the proximal phalanx of the second toe and possibly of the plantar base of the proximal phalanx of the third toe. No other evidence of osteomyelitis. 2. Chronic dislocation of the proximal phalanx of the second toe dorsal to the second metatarsal head. 3. Previous amputations as described stable from the prior study. 4. Diffuse forefoot soft tissue swelling.  No soft tissue air. Electronically Signed   By: Lajean Manes M.D.   On: 03/30/2020 13:34   ECHOCARDIOGRAM COMPLETE  Result Date: 03/31/2020    ECHOCARDIOGRAM REPORT   Patient Name:   CHELSY PARRALES Date of Exam: 03/31/2020 Medical Rec #:  [id number]       Height:       69.0 in Accession #:    [id number]      Weight:       190.6 lb Date of Birth:  December 20, 1975       BSA:          2.024 m Patient Age:    83 years        BP:           97/70 mmHg Patient Gender: F               HR:           93 bpm. Exam Location:  [facility] Procedure: 2D Echo, Cardiac Doppler, Color Doppler and Intracardiac            Opacification Agent Indications:     Preoperative evaluation  History:         Patient has prior history of Echocardiogram examinations, most                  recent 03/26/2019. COPD; Risk Factors:Hypertension and Diabetes.  Sonographer:     Sherrie Sport RDCS (AE) Referring Phys:   [id number] Harvie Bridge Diagnosing Phys: Harrell Gave End MD  Sonographer Comments: Suboptimal apical window. Image acquisition challenging due to COPD. IMPRESSIONS  1. Left ventricular ejection fraction, by estimation, is 40 to 45%. The left ventricle has mildly decreased function. The left ventricle demonstrates global hypokinesis. The left ventricular internal  cavity size was mildly dilated. There is mild left ventricular hypertrophy. Indeterminate diastolic filling due to E-A fusion.  2. Right ventricular systolic function is moderately reduced. The right ventricular size is normal. There is moderately elevated pulmonary artery systolic pressure.  3. Left atrial size was mildly dilated.  4. Degree mitral stenosis may be overestimated by increased transmitral flow in the setting of severe mitral regurgitation. 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 Moderate to severe mitral stenosis.  5. Tricuspid valve regurgitation is mild to moderate.  6. The aortic valve is tricuspid. Aortic valve regurgitation is trivial. No aortic stenosis is present.  7. The inferior vena cava is normal in size with <50% respiratory variability, suggesting right atrial pressure of 8 mmHg. FINDINGS  Left Ventricle: Left ventricular ejection fraction, by estimation, is 40 to 45%. The left ventricle has mildly decreased function. The left ventricle demonstrates global hypokinesis. Definity contrast agent was given IV to delineate the left ventricular  endocardial borders. The left ventricular internal cavity size was mildly dilated. There is mild left ventricular hypertrophy. Indeterminate diastolic filling due to E-A fusion. Right Ventricle: The right ventricular size is normal. No increase in right ventricular wall thickness. Right ventricular systolic function is moderately reduced. There is moderately elevated pulmonary artery systolic pressure. The tricuspid regurgitant velocity is 3.66 m/s, and with an  assumed right atrial pressure of 8 mmHg, the estimated right ventricular systolic pressure is 38.7 mmHg. Left Atrium: Left atrial size was mildly dilated. Right Atrium: Right atrial size was normal in size. Pericardium: There is no evidence of pericardial effusion. Mitral Valve: Degree mitral stenosis may be overestimated by increased transmitral flow in the setting of severe mitral regurgitation. The mitral valve is rheumatic. There is mild thickening of the mitral valve leaflet(s). Severe mitral valve regurgitation. Moderate to severe mitral valve stenosis. MV peak gradient, 14.6 mmHg. The mean mitral valve gradient is 10.0 mmHg. Tricuspid Valve: The tricuspid valve is normal in structure. Tricuspid valve regurgitation is mild to moderate. Aortic Valve: The aortic valve is tricuspid. Aortic valve regurgitation is trivial. No aortic stenosis is present. Aortic valve mean gradient measures 4.0 mmHg. Aortic valve peak gradient measures 6.5 mmHg. Aortic valve area, by VTI measures 2.29 cm. Pulmonic Valve: The pulmonic valve was not well visualized. Pulmonic valve regurgitation is trivial. No evidence of pulmonic stenosis. Aorta: The aortic root is normal in size and structure. Pulmonary Artery: The pulmonary artery is not well seen. Venous: The inferior vena cava is normal in size with less than 50% respiratory variability, suggesting right atrial pressure of 8 mmHg. IAS/Shunts: The interatrial septum was not well visualized.  LEFT VENTRICLE PLAX 2D LVIDd:         5.25 cm      Diastology LVIDs:         4.43 cm      LV e' lateral:   5.22 cm/s LV PW:         1.08 cm      LV E/e' lateral: 39.1 LV IVS:        1.29 cm      LV e' medial:    7.62 cm/s LVOT diam:     2.00 cm      LV E/e' medial:  26.8 LV SV:         45 LV SV Index:   22 LVOT Area:     3.14 cm  LV Volumes (MOD) LV vol d, MOD A2C: 158.0 ml LV vol d,  MOD A4C: 144.0 ml LV vol s, MOD A2C: 106.0 ml LV vol s, MOD A4C: 85.8 ml LV SV MOD A2C:     52.0 ml LV SV MOD  A4C:     144.0 ml LV SV MOD BP:      54.5 ml RIGHT VENTRICLE RV Basal diam:  3.72 cm RV S prime:     8.81 cm/s TAPSE (M-mode): 1.1 cm LEFT ATRIUM             Index       RIGHT ATRIUM           Index LA diam:        4.90 cm 2.42 cm/m  RA Area:     12.90 cm LA Vol (A2C):   81.7 ml 40.36 ml/m RA Volume:   26.40 ml  13.04 ml/m LA Vol (A4C):   60.8 ml 30.04 ml/m LA Biplane Vol: 75.3 ml 37.20 ml/m  AORTIC VALVE                   PULMONIC VALVE AV Area (Vmax):    1.68 cm    PV Vmax:        0.55 m/s AV Area (Vmean):   1.75 cm    PV Peak grad:   1.2 mmHg AV Area (VTI):     2.29 cm    RVOT Peak grad: 3 mmHg AV Vmax:           127.50 cm/s AV Vmean:          94.900 cm/s AV VTI:            0.196 m AV Peak Grad:      6.5 mmHg AV Mean Grad:      4.0 mmHg LVOT Vmax:         68.00 cm/s LVOT Vmean:        52.900 cm/s LVOT VTI:          0.143 m LVOT/AV VTI ratio: 0.73  AORTA Ao Root diam: 2.50 cm MITRAL VALVE                TRICUSPID VALVE MV Area (PHT): 5.62 cm     TR Peak grad:   53.6 mmHg MV Peak grad:  14.6 mmHg    TR Vmax:        366.00 cm/s MV Mean grad:  10.0 mmHg MV Vmax:       1.91 m/s     SHUNTS MV Vmean:      148.0 cm/s   Systemic VTI:  0.14 m MV Decel Time: 135 msec     Systemic Diam: 2.00 cm MV E velocity: 204.00 cm/s MV A velocity: 191.00 cm/s MV E/A ratio:  1.07 Harrell Gave End MD Electronically signed by Nelva Bush MD Signature Date/Time: 03/31/2020/1:11:52 PM    Final       Subjective: Seen and examined.  Denies any pain on the foot.  Discharge Exam: Vitals:   04/01/20 2352 04/02/20 0718  BP: 130/84 105/75  Pulse: 96 95  Resp: 16 18  Temp: 98.5 F (36.9 C) 98 F (36.7 C)  SpO2: 100% 96%   Vitals:   04/01/20 1631 04/01/20 2136 04/01/20 2352 04/02/20 0718  BP: 105/73 105/88 130/84 105/75  Pulse: 86 96 96 95  Resp: 16 20 16 18   Temp: 99.2 F (37.3 C) 99.2 F (37.3 C) 98.5 F (36.9 C) 98 F (36.7 C)  TempSrc: Oral Oral Oral Oral  SpO2: 100% 100% 100% 96%  Weight:      Height:        General: Middle-aged female not in distress HEENT: Moist with a, supple neck Chest: Clear bilaterally CVs: Normal S1-S2, no murmurs GI: Soft, nondistended, nontender Musculoskeletal: Warm, clean dressing with Ace wrap over left foot, cast over right foot    The results of significant diagnostics from this hospitalization (including imaging, microbiology, ancillary and laboratory) are listed below for reference.     Microbiology: Recent Results (from the past 240 hour(s))  Culture, blood (Routine x 2)     Status: None (Preliminary result)   Collection Time: 03/30/20 12:47 PM   Specimen: BLOOD  Result Value Ref Range Status   Specimen Description BLOOD RIGHT ANTECUBITAL  Final   Special Requests   Final    BOTTLES DRAWN AEROBIC AND ANAEROBIC Blood Culture adequate volume   Culture   Final    NO GROWTH 3 DAYS Performed at [facility], [address] [place], [place] [address]    Report Status PENDING  Incomplete  Culture, blood (Routine x 2)     Status: None (Preliminary result)   Collection Time: 03/30/20  5:40 PM   Specimen: BLOOD  Result Value Ref Range Status   Specimen Description BLOOD BLOOD RIGHT FOREARM  Final   Special Requests   Final    BOTTLES DRAWN AEROBIC AND ANAEROBIC Blood Culture results may not be optimal due to an inadequate volume of blood received in culture bottles   Culture   Final    NO GROWTH 3 DAYS Performed at [facility], [address] [place], [place] [address]    Report Status PENDING  Incomplete  SARS Coronavirus 2 by RT PCR (hospital order, performed in [place] hospital lab) Nasopharyngeal Nasopharyngeal Swab     Status: None   Collection Time: 03/30/20  5:41 PM   Specimen: Nasopharyngeal Swab  Result Value Ref Range Status   SARS Coronavirus 2 NEGATIVE NEGATIVE Final    Comment: (NOTE) SARS-CoV-2 target nucleic acids are NOT DETECTED. The SARS-CoV-2 RNA is generally detectable in upper and lower respiratory  specimens during the acute phase of infection. The lowest concentration of SARS-CoV-2 viral copies this assay can detect is 250 copies / mL. A negative result does not preclude SARS-CoV-2 infection and should not be used as the sole basis for treatment or other patient management decisions.  A negative result may occur with improper specimen collection / handling, submission of specimen other than nasopharyngeal swab, presence of viral mutation(s) within the areas targeted by this assay, and inadequate number of viral copies (<250 copies / mL). A negative result must be combined with clinical observations, patient history, and epidemiological information. Fact Sheet for Patients:   StrictlyIdeas.no Fact Sheet for Healthcare Providers: BankingDealers.co.za This test is not yet approved or cleared  by the [place] FDA and has been authorized for detection and/or diagnosis of SARS-CoV-2 by FDA under an Emergency Use Authorization (EUA).  This EUA will remain in effect (meaning this test can be used) for the duration of the COVID-19 declaration under Section 564(b)(1) of the Act, 21 U.S.C. section 360bbb-3(b)(1), unless the authorization is terminated or revoked sooner. Performed at [facility], [address] [place], [place] [address]   Aerobic/Anaerobic Culture (surgical/deep wound)     Status: None (Preliminary result)   Collection Time: 03/30/20 10:05 PM   Specimen: Wound  Result Value Ref Range Status   Specimen Description   Final    WOUND Performed  at [facility], [address], [place], [place] [address]    Special Requests   Final    NONE Performed at [facility], [address] [place], [place] [address]    Gram Stain   Final    NO WBC SEEN RARE GRAM POSITIVE COCCI IN PAIRS Performed at [facility], [place] [address], [place], [place] [address]    Culture   Final    FEW  STAPHYLOCOCCUS AUREUS NO ANAEROBES ISOLATED; CULTURE IN PROGRESS FOR 5 DAYS    Report Status PENDING  Incomplete   Organism ID, Bacteria STAPHYLOCOCCUS AUREUS  Final      Susceptibility   Staphylococcus aureus - MIC*    CIPROFLOXACIN <=0.5 SENSITIVE Sensitive     ERYTHROMYCIN <=0.25 SENSITIVE Sensitive     GENTAMICIN <=0.5 SENSITIVE Sensitive     OXACILLIN 0.5 SENSITIVE Sensitive     TETRACYCLINE >=16 RESISTANT Resistant     VANCOMYCIN 1 SENSITIVE Sensitive     TRIMETH/SULFA <=10 SENSITIVE Sensitive     CLINDAMYCIN <=0.25 SENSITIVE Sensitive     RIFAMPIN <=0.5 SENSITIVE Sensitive     Inducible Clindamycin NEGATIVE Sensitive     * FEW STAPHYLOCOCCUS AUREUS  Surgical pcr screen     Status: None   Collection Time: 03/30/20 10:05 PM   Specimen: Nasal Mucosa; Nasal Swab  Result Value Ref Range Status   MRSA, PCR NEGATIVE NEGATIVE Final   Staphylococcus aureus NEGATIVE NEGATIVE Final    Comment: (NOTE) The Xpert SA Assay (FDA approved for NASAL specimens in patients 38 years of age and older), is one component of a comprehensive surveillance program. It is not intended to diagnose infection nor to guide or monitor treatment. Performed at [facility], [address] [place], [place] [address]      Labs: BNP (last 3 results) No results for input(s): BNP in the last 8760 hours. Basic Metabolic Panel: Recent Labs  Lab 03/30/20 1247 03/31/20 0444 04/01/20 0435 04/02/20 0457  NA 135 138 140  --   K 3.5 3.6 3.9  --   CL 100 103 106  --   CO2 26 27 26   --   GLUCOSE 236* 178* 179*  --   BUN 15 15 18   --   CREATININE 1.62* 1.53* 1.53* 1.41*  CALCIUM 8.8* 8.4* 8.7*  --    Liver Function Tests: Recent Labs  Lab 03/30/20 1247  AST 19  ALT 23  ALKPHOS 88  BILITOT 0.6  PROT 7.8  ALBUMIN 3.2*   No results for input(s): LIPASE, AMYLASE in the last 168 hours. No results for input(s): AMMONIA in the last 168 hours. CBC: Recent Labs  Lab 03/30/20 1247  03/31/20 0444 04/02/20 0457  WBC 7.7 7.3 8.5  NEUTROABS 5.9  --   --   HGB 11.6* 10.9* 10.1*  HCT 34.9* 33.2* 31.7*  MCV 87.0 88.5 90.8  PLT 475* 464* 427*   Cardiac Enzymes: No results for input(s): CKTOTAL, CKMB, CKMBINDEX, TROPONINI in the last 168 hours. BNP: Invalid input(s): POCBNP CBG: Recent Labs  Lab 04/01/20 2020 04/01/20 2354 04/02/20 0408 04/02/20 0746 04/02/20 1218  GLUCAP 172* 138* 192* 166* 199*   D-Dimer No results for input(s): DDIMER in the last 72 hours. Hgb A1c Recent Labs    03/31/20 0444  HGBA1C 7.3*   Lipid Profile No results for input(s): CHOL, HDL, LDLCALC, TRIG, CHOLHDL, LDLDIRECT in the last 72 hours. Thyroid function studies No results for input(s): TSH, T4TOTAL, T3FREE, THYROIDAB in  the last 72 hours.  Invalid input(s): FREET3 Anemia work up No results for input(s): VITAMINB12, FOLATE, FERRITIN, TIBC, IRON, RETICCTPCT in the last 72 hours. Urinalysis    Component Value Date/Time   COLORURINE YELLOW (A) 08/15/2019 1931   APPEARANCEUR CLEAR (A) 08/15/2019 1931   LABSPEC 1.015 08/15/2019 1931   PHURINE 5.0 08/15/2019 1931   GLUCOSEU >=500 (A) 08/15/2019 1931   HGBUR NEGATIVE 08/15/2019 1931   BILIRUBINUR NEGATIVE 08/15/2019 1931   KETONESUR NEGATIVE 08/15/2019 1931   PROTEINUR NEGATIVE 08/15/2019 1931   UROBILINOGEN 0.2 04/29/2014 0001   NITRITE NEGATIVE 08/15/2019 1931   LEUKOCYTESUR TRACE (A) 08/15/2019 1931   Sepsis Labs Invalid input(s): PROCALCITONIN,  WBC,  LACTICIDVEN Microbiology Recent Results (from the past 240 hour(s))  Culture, blood (Routine x 2)     Status: None (Preliminary result)   Collection Time: 03/30/20 12:47 PM   Specimen: BLOOD  Result Value Ref Range Status   Specimen Description BLOOD RIGHT ANTECUBITAL  Final   Special Requests   Final    BOTTLES DRAWN AEROBIC AND ANAEROBIC Blood Culture adequate volume   Culture   Final    NO GROWTH 3 DAYS Performed at [facility], [address]  [place], [place] [address]    Report Status PENDING  Incomplete  Culture, blood (Routine x 2)     Status: None (Preliminary result)   Collection Time: 03/30/20  5:40 PM   Specimen: BLOOD  Result Value Ref Range Status   Specimen Description BLOOD BLOOD RIGHT FOREARM  Final   Special Requests   Final    BOTTLES DRAWN AEROBIC AND ANAEROBIC Blood Culture results may not be optimal due to an inadequate volume of blood received in culture bottles   Culture   Final    NO GROWTH 3 DAYS Performed at [facility], [address], [place], [place] [address]    Report Status PENDING  Incomplete  SARS Coronavirus 2 by RT PCR (hospital order, performed in [place] hospital lab) Nasopharyngeal Nasopharyngeal Swab     Status: None   Collection Time: 03/30/20  5:41 PM   Specimen: Nasopharyngeal Swab  Result Value Ref Range Status   SARS Coronavirus 2 NEGATIVE NEGATIVE Final    Comment: (NOTE) SARS-CoV-2 target nucleic acids are NOT DETECTED. The SARS-CoV-2 RNA is generally detectable in upper and lower respiratory specimens during the acute phase of infection. The lowest concentration of SARS-CoV-2 viral copies this assay can detect is 250 copies / mL. A negative result does not preclude SARS-CoV-2 infection and should not be used as the sole basis for treatment or other patient management decisions.  A negative result may occur with improper specimen collection / handling, submission of specimen other than nasopharyngeal swab, presence of viral mutation(s) within the areas targeted by this assay, and inadequate number of viral copies (<250 copies / mL). A negative result must be combined with clinical observations, patient history, and epidemiological information. Fact Sheet for Patients:   StrictlyIdeas.no Fact Sheet for Healthcare Providers: BankingDealers.co.za This test is not yet approved or cleared  by the [place] FDA and has  been authorized for detection and/or diagnosis of SARS-CoV-2 by FDA under an Emergency Use Authorization (EUA).  This EUA will remain in effect (meaning this test can be used) for the duration of the COVID-19 declaration under Section 564(b)(1) of the Act, 21 U.S.C. section 360bbb-3(b)(1), unless the authorization is terminated or revoked sooner. Performed at [facility], [address], [place], [place] [address]  Aerobic/Anaerobic Culture (surgical/deep wound)     Status: None (Preliminary result)   Collection Time: 03/30/20 10:05 PM   Specimen: Wound  Result Value Ref Range Status   Specimen Description   Final    WOUND Performed at [facility], [address], [place], [place] [address]    Special Requests   Final    NONE Performed at [facility], [address] [place], [place] [address]    Gram Stain   Final    NO WBC SEEN RARE GRAM POSITIVE COCCI IN PAIRS Performed at [facility], [place] [address] [place], [place] [address]    Culture   Final    FEW STAPHYLOCOCCUS AUREUS NO ANAEROBES ISOLATED; CULTURE IN PROGRESS FOR 5 DAYS    Report Status PENDING  Incomplete   Organism ID, Bacteria STAPHYLOCOCCUS AUREUS  Final      Susceptibility   Staphylococcus aureus - MIC*    CIPROFLOXACIN <=0.5 SENSITIVE Sensitive     ERYTHROMYCIN <=0.25 SENSITIVE Sensitive     GENTAMICIN <=0.5 SENSITIVE Sensitive     OXACILLIN 0.5 SENSITIVE Sensitive     TETRACYCLINE >=16 RESISTANT Resistant     VANCOMYCIN 1 SENSITIVE Sensitive     TRIMETH/SULFA <=10 SENSITIVE Sensitive     CLINDAMYCIN <=0.25 SENSITIVE Sensitive     RIFAMPIN <=0.5 SENSITIVE Sensitive     Inducible Clindamycin NEGATIVE Sensitive     * FEW STAPHYLOCOCCUS AUREUS  Surgical pcr screen     Status: None   Collection Time: 03/30/20 10:05 PM   Specimen: Nasal Mucosa; Nasal Swab  Result Value Ref Range Status   MRSA, PCR NEGATIVE NEGATIVE Final   Staphylococcus aureus NEGATIVE NEGATIVE  Final    Comment: (NOTE) The Xpert SA Assay (FDA approved for NASAL specimens in patients 30 years of age and older), is one component of a comprehensive surveillance program. It is not intended to diagnose infection nor to guide or monitor treatment. Performed at [facility], [address], [place], [place] [address]      Time coordinating discharge: 35 minutes  SIGNED:   Louellen Molder, MD  [facility] 04/02/2020, 12:27 PM Pager   If 7PM-7AM, please contact night-coverage [email or web address] Password [email or web address]

## 2020-04-02 NOTE — Progress Notes (Signed)
Follow-up transmetatarsal amputation left foot.  Patient doing well.  Some pain as expected.  Incisions well coapted.  No dehiscence at all.  Achilles lengthening site is healing.  New dressing placed.  Splint replaced.  She can follow-up with Dr. Luana Shu in 1 week.  Okay to discharge from podiatry standpoint.

## 2020-04-02 NOTE — TOC Progression Note (Signed)
Transition of Care [facility]) - Progression Note    Patient Details  Name: [name] MRN: [id number] Date of Birth: Jan 11, 1976  Transition of Care [facility]) CM/SW Contact  Sharren Schnurr, Gardiner Rhyme, LCSW Phone Number: 04/02/2020, 10:21 AM  Clinical Narrative:   Have attempted to call several home health agencies to see if could take Medicaid. Currently they arte not taking Medicaid pt's due to clitch in the new managed care Medicaid system. Pt is also NWB on both legs and is able to do sliding board transfers, dont want to waste her visits either, due to once can WB will need to go to OPPT like she has done in the past. Pt and Mom are in agreement with this plan. Pt has all needed equipment from previous admits. Continue to follow and work on any other discharge needs.    Expected Discharge Plan: [place] Barriers to Discharge: Continued Medical Work up  Expected Discharge Plan and Services Expected Discharge Plan: [facility] In-house Referral: Clinical Social Work     Living arrangements for the past 2 months: Mobile Home                                       Social Determinants of Health (SDOH) Interventions    Readmission Risk Interventions No flowsheet data found.

## 2020-04-02 NOTE — Progress Notes (Signed)
MD order received in [facility] to discharge pt home today; verbally reviewed AVS with pt, Rxs escribed to pt's pharmacy in [place], [place]; no questions voiced at this time; pt's discharge pending arrival of her ride home; verbally instructed pt to call when her ride is at the medical mall entrance in order to discharge pt

## 2020-04-04 LAB — CULTURE, BLOOD (ROUTINE X 2)
Culture: NO GROWTH
Culture: NO GROWTH
Special Requests: ADEQUATE

## 2020-04-05 LAB — AEROBIC/ANAEROBIC CULTURE W GRAM STAIN (SURGICAL/DEEP WOUND): Gram Stain: NONE SEEN

## 2020-07-16 ENCOUNTER — Other Ambulatory Visit: Payer: Self-pay | Admitting: Podiatry

## 2020-07-16 DIAGNOSIS — M25571 Pain in right ankle and joints of right foot: Secondary | ICD-10-CM

## 2020-07-16 DIAGNOSIS — S8254XK Nondisplaced fracture of medial malleolus of right tibia, subsequent encounter for closed fracture with nonunion: Secondary | ICD-10-CM

## 2020-07-16 DIAGNOSIS — S8264XK Nondisplaced fracture of lateral malleolus of right fibula, subsequent encounter for closed fracture with nonunion: Secondary | ICD-10-CM

## 2020-07-16 DIAGNOSIS — M792 Neuralgia and neuritis, unspecified: Secondary | ICD-10-CM

## 2020-07-16 DIAGNOSIS — M14671 Charcot's joint, right ankle and foot: Secondary | ICD-10-CM

## 2020-07-20 ENCOUNTER — Ambulatory Visit
Admission: RE | Admit: 2020-07-20 | Discharge: 2020-07-20 | Disposition: A | Discharge status: Home / Self Care | Payer: Medicaid Other | Source: Ambulatory Visit | Attending: Podiatry | Admitting: Podiatry

## 2020-07-20 ENCOUNTER — Other Ambulatory Visit: Payer: Self-pay

## 2020-07-20 DIAGNOSIS — M25571 Pain in right ankle and joints of right foot: Secondary | ICD-10-CM | POA: Diagnosis present

## 2020-07-20 DIAGNOSIS — S8264XK Nondisplaced fracture of lateral malleolus of right fibula, subsequent encounter for closed fracture with nonunion: Secondary | ICD-10-CM

## 2020-07-20 DIAGNOSIS — S8254XK Nondisplaced fracture of medial malleolus of right tibia, subsequent encounter for closed fracture with nonunion: Secondary | ICD-10-CM

## 2020-07-20 DIAGNOSIS — M792 Neuralgia and neuritis, unspecified: Secondary | ICD-10-CM | POA: Diagnosis present

## 2020-07-20 DIAGNOSIS — M14671 Charcot's joint, right ankle and foot: Secondary | ICD-10-CM | POA: Diagnosis present

## 2020-08-11 DIAGNOSIS — F1211 Cannabis abuse, in remission: Secondary | ICD-10-CM | POA: Insufficient documentation

## 2020-08-11 DIAGNOSIS — F1411 Cocaine abuse, in remission: Secondary | ICD-10-CM | POA: Insufficient documentation

## 2020-08-11 DIAGNOSIS — F1011 Alcohol abuse, in remission: Secondary | ICD-10-CM | POA: Insufficient documentation

## 2020-09-30 ENCOUNTER — Inpatient Hospital Stay
Admission: EM | Admit: 2020-09-30 | Discharge: 2020-10-07 | DRG: 240 | Disposition: A | Discharge status: Home Health | Payer: Medicaid Other | Attending: Internal Medicine | Admitting: Internal Medicine

## 2020-09-30 ENCOUNTER — Other Ambulatory Visit: Payer: Self-pay

## 2020-09-30 ENCOUNTER — Emergency Department: Payer: Medicaid Other

## 2020-09-30 ENCOUNTER — Encounter: Payer: Self-pay | Admitting: *Deleted

## 2020-09-30 DIAGNOSIS — E1165 Type 2 diabetes mellitus with hyperglycemia: Secondary | ICD-10-CM | POA: Diagnosis present

## 2020-09-30 DIAGNOSIS — E11621 Type 2 diabetes mellitus with foot ulcer: Secondary | ICD-10-CM | POA: Diagnosis present

## 2020-09-30 DIAGNOSIS — I5042 Chronic combined systolic (congestive) and diastolic (congestive) heart failure: Secondary | ICD-10-CM | POA: Diagnosis present

## 2020-09-30 DIAGNOSIS — I739 Peripheral vascular disease, unspecified: Secondary | ICD-10-CM | POA: Diagnosis not present

## 2020-09-30 DIAGNOSIS — E1142 Type 2 diabetes mellitus with diabetic polyneuropathy: Secondary | ICD-10-CM | POA: Diagnosis present

## 2020-09-30 DIAGNOSIS — F17210 Nicotine dependence, cigarettes, uncomplicated: Secondary | ICD-10-CM | POA: Diagnosis present

## 2020-09-30 DIAGNOSIS — E11628 Type 2 diabetes mellitus with other skin complications: Secondary | ICD-10-CM | POA: Diagnosis present

## 2020-09-30 DIAGNOSIS — F1290 Cannabis use, unspecified, uncomplicated: Secondary | ICD-10-CM | POA: Diagnosis present

## 2020-09-30 DIAGNOSIS — L03116 Cellulitis of left lower limb: Secondary | ICD-10-CM | POA: Diagnosis present

## 2020-09-30 DIAGNOSIS — Z09 Encounter for follow-up examination after completed treatment for conditions other than malignant neoplasm: Secondary | ICD-10-CM

## 2020-09-30 DIAGNOSIS — F1990 Other psychoactive substance use, unspecified, uncomplicated: Secondary | ICD-10-CM | POA: Diagnosis present

## 2020-09-30 DIAGNOSIS — S90821A Blister (nonthermal), right foot, initial encounter: Secondary | ICD-10-CM | POA: Diagnosis present

## 2020-09-30 DIAGNOSIS — E13621 Other specified diabetes mellitus with foot ulcer: Secondary | ICD-10-CM

## 2020-09-30 DIAGNOSIS — E1100 Type 2 diabetes mellitus with hyperosmolarity without nonketotic hyperglycemic-hyperosmolar coma (NKHHC): Secondary | ICD-10-CM | POA: Diagnosis not present

## 2020-09-30 DIAGNOSIS — I10 Essential (primary) hypertension: Secondary | ICD-10-CM

## 2020-09-30 DIAGNOSIS — S82891A Other fracture of right lower leg, initial encounter for closed fracture: Secondary | ICD-10-CM | POA: Diagnosis present

## 2020-09-30 DIAGNOSIS — N179 Acute kidney failure, unspecified: Secondary | ICD-10-CM

## 2020-09-30 DIAGNOSIS — E1122 Type 2 diabetes mellitus with diabetic chronic kidney disease: Secondary | ICD-10-CM | POA: Diagnosis present

## 2020-09-30 DIAGNOSIS — D631 Anemia in chronic kidney disease: Secondary | ICD-10-CM | POA: Diagnosis present

## 2020-09-30 DIAGNOSIS — L089 Local infection of the skin and subcutaneous tissue, unspecified: Secondary | ICD-10-CM | POA: Diagnosis not present

## 2020-09-30 DIAGNOSIS — G2581 Restless legs syndrome: Secondary | ICD-10-CM | POA: Diagnosis present

## 2020-09-30 DIAGNOSIS — E1169 Type 2 diabetes mellitus with other specified complication: Secondary | ICD-10-CM | POA: Diagnosis present

## 2020-09-30 DIAGNOSIS — E1152 Type 2 diabetes mellitus with diabetic peripheral angiopathy with gangrene: Principal | ICD-10-CM | POA: Diagnosis present

## 2020-09-30 DIAGNOSIS — Z89421 Acquired absence of other right toe(s): Secondary | ICD-10-CM

## 2020-09-30 DIAGNOSIS — L89892 Pressure ulcer of other site, stage 2: Secondary | ICD-10-CM | POA: Diagnosis not present

## 2020-09-30 DIAGNOSIS — M869 Osteomyelitis, unspecified: Secondary | ICD-10-CM

## 2020-09-30 DIAGNOSIS — I70261 Atherosclerosis of native arteries of extremities with gangrene, right leg: Secondary | ICD-10-CM | POA: Diagnosis not present

## 2020-09-30 DIAGNOSIS — Z8249 Family history of ischemic heart disease and other diseases of the circulatory system: Secondary | ICD-10-CM

## 2020-09-30 DIAGNOSIS — S8251XK Displaced fracture of medial malleolus of right tibia, subsequent encounter for closed fracture with nonunion: Secondary | ICD-10-CM

## 2020-09-30 DIAGNOSIS — K219 Gastro-esophageal reflux disease without esophagitis: Secondary | ICD-10-CM | POA: Diagnosis present

## 2020-09-30 DIAGNOSIS — I130 Hypertensive heart and chronic kidney disease with heart failure and stage 1 through stage 4 chronic kidney disease, or unspecified chronic kidney disease: Secondary | ICD-10-CM | POA: Diagnosis present

## 2020-09-30 DIAGNOSIS — E785 Hyperlipidemia, unspecified: Secondary | ICD-10-CM | POA: Diagnosis present

## 2020-09-30 DIAGNOSIS — L97518 Non-pressure chronic ulcer of other part of right foot with other specified severity: Secondary | ICD-10-CM

## 2020-09-30 DIAGNOSIS — F1410 Cocaine abuse, uncomplicated: Secondary | ICD-10-CM | POA: Diagnosis present

## 2020-09-30 DIAGNOSIS — Z8614 Personal history of Methicillin resistant Staphylococcus aureus infection: Secondary | ICD-10-CM

## 2020-09-30 DIAGNOSIS — L89899 Pressure ulcer of other site, unspecified stage: Secondary | ICD-10-CM

## 2020-09-30 DIAGNOSIS — Z9119 Patient's noncompliance with other medical treatment and regimen: Secondary | ICD-10-CM | POA: Diagnosis not present

## 2020-09-30 DIAGNOSIS — G249 Dystonia, unspecified: Secondary | ICD-10-CM | POA: Diagnosis present

## 2020-09-30 DIAGNOSIS — B9561 Methicillin susceptible Staphylococcus aureus infection as the cause of diseases classified elsewhere: Secondary | ICD-10-CM | POA: Diagnosis present

## 2020-09-30 DIAGNOSIS — F319 Bipolar disorder, unspecified: Secondary | ICD-10-CM | POA: Diagnosis present

## 2020-09-30 DIAGNOSIS — D638 Anemia in other chronic diseases classified elsewhere: Secondary | ICD-10-CM | POA: Diagnosis not present

## 2020-09-30 DIAGNOSIS — Z7901 Long term (current) use of anticoagulants: Secondary | ICD-10-CM

## 2020-09-30 DIAGNOSIS — I5022 Chronic systolic (congestive) heart failure: Secondary | ICD-10-CM

## 2020-09-30 DIAGNOSIS — R569 Unspecified convulsions: Secondary | ICD-10-CM | POA: Diagnosis present

## 2020-09-30 DIAGNOSIS — J449 Chronic obstructive pulmonary disease, unspecified: Secondary | ICD-10-CM | POA: Diagnosis present

## 2020-09-30 DIAGNOSIS — S82899A Other fracture of unspecified lower leg, initial encounter for closed fracture: Secondary | ICD-10-CM

## 2020-09-30 DIAGNOSIS — Z20822 Contact with and (suspected) exposure to covid-19: Secondary | ICD-10-CM | POA: Diagnosis present

## 2020-09-30 DIAGNOSIS — Z79899 Other long term (current) drug therapy: Secondary | ICD-10-CM

## 2020-09-30 DIAGNOSIS — Z560 Unemployment, unspecified: Secondary | ICD-10-CM

## 2020-09-30 DIAGNOSIS — N189 Chronic kidney disease, unspecified: Secondary | ICD-10-CM | POA: Diagnosis not present

## 2020-09-30 DIAGNOSIS — L97519 Non-pressure chronic ulcer of other part of right foot with unspecified severity: Secondary | ICD-10-CM | POA: Diagnosis present

## 2020-09-30 DIAGNOSIS — M86171 Other acute osteomyelitis, right ankle and foot: Secondary | ICD-10-CM | POA: Diagnosis present

## 2020-09-30 DIAGNOSIS — L89622 Pressure ulcer of left heel, stage 2: Secondary | ICD-10-CM | POA: Diagnosis present

## 2020-09-30 DIAGNOSIS — Z794 Long term (current) use of insulin: Secondary | ICD-10-CM

## 2020-09-30 DIAGNOSIS — Z881 Allergy status to other antibiotic agents status: Secondary | ICD-10-CM

## 2020-09-30 DIAGNOSIS — N1831 Chronic kidney disease, stage 3a: Secondary | ICD-10-CM | POA: Diagnosis present

## 2020-09-30 DIAGNOSIS — E118 Type 2 diabetes mellitus with unspecified complications: Secondary | ICD-10-CM

## 2020-09-30 DIAGNOSIS — F419 Anxiety disorder, unspecified: Secondary | ICD-10-CM | POA: Diagnosis present

## 2020-09-30 DIAGNOSIS — L97514 Non-pressure chronic ulcer of other part of right foot with necrosis of bone: Secondary | ICD-10-CM

## 2020-09-30 DIAGNOSIS — E119 Type 2 diabetes mellitus without complications: Secondary | ICD-10-CM

## 2020-09-30 DIAGNOSIS — N183 Chronic kidney disease, stage 3 unspecified: Secondary | ICD-10-CM | POA: Diagnosis not present

## 2020-09-30 DIAGNOSIS — L97529 Non-pressure chronic ulcer of other part of left foot with unspecified severity: Secondary | ICD-10-CM | POA: Diagnosis present

## 2020-09-30 DIAGNOSIS — Z813 Family history of other psychoactive substance abuse and dependence: Secondary | ICD-10-CM

## 2020-09-30 HISTORY — DX: Dyspnea, unspecified: R06.00

## 2020-09-30 LAB — COMPREHENSIVE METABOLIC PANEL
ALT: 19 U/L (ref 0–44)
AST: 26 U/L (ref 15–41)
Albumin: 2.9 g/dL — ABNORMAL LOW (ref 3.5–5.0)
Alkaline Phosphatase: 112 U/L (ref 38–126)
Anion gap: 11 (ref 5–15)
BUN: 16 mg/dL (ref 6–20)
CO2: 27 mmol/L (ref 22–32)
Calcium: 8.8 mg/dL — ABNORMAL LOW (ref 8.9–10.3)
Chloride: 93 mmol/L — ABNORMAL LOW (ref 98–111)
Creatinine, Ser: 1.61 mg/dL — ABNORMAL HIGH (ref 0.44–1.00)
GFR, Estimated: 40 mL/min — ABNORMAL LOW (ref >60)
Glucose, Bld: 482 mg/dL — ABNORMAL HIGH (ref 70–99)
Potassium: 3.5 mmol/L (ref 3.5–5.1)
Sodium: 131 mmol/L — ABNORMAL LOW (ref 135–145)
Total Bilirubin: 0.5 mg/dL (ref 0.3–1.2)
Total Protein: 8 g/dL (ref 6.5–8.1)

## 2020-09-30 LAB — CBC WITH DIFFERENTIAL/PLATELET
Abs Immature Granulocytes: 0.04 10*3/uL (ref 0.00–0.07)
Basophils Absolute: 0.1 10*3/uL (ref 0.0–0.1)
Basophils Relative: 1 %
Eosinophils Absolute: 0.1 10*3/uL (ref 0.0–0.5)
Eosinophils Relative: 1 %
HCT: 43 % (ref 36.0–46.0)
Hemoglobin: 14.5 g/dL (ref 12.0–15.0)
Immature Granulocytes: 0 %
Lymphocytes Relative: 10 %
Lymphs Abs: 1 10*3/uL (ref 0.7–4.0)
MCH: 28.3 pg (ref 26.0–34.0)
MCHC: 33.7 g/dL (ref 30.0–36.0)
MCV: 84 fL (ref 80.0–100.0)
Monocytes Absolute: 0.3 10*3/uL (ref 0.1–1.0)
Monocytes Relative: 3 %
Neutro Abs: 8.8 10*3/uL — ABNORMAL HIGH (ref 1.7–7.7)
Neutrophils Relative %: 85 %
Platelets: 372 10*3/uL (ref 150–400)
RBC: 5.12 MIL/uL — ABNORMAL HIGH (ref 3.87–5.11)
RDW: 12.8 % (ref 11.5–15.5)
WBC: 10.3 10*3/uL (ref 4.0–10.5)
nRBC: 0 % (ref 0.0–0.2)

## 2020-09-30 LAB — CBG MONITORING, ED: Glucose-Capillary: 313 mg/dL — ABNORMAL HIGH (ref 70–99)

## 2020-09-30 MED ORDER — SODIUM CHLORIDE 0.9 % IV BOLUS
1000.0000 mL | Freq: Once | INTRAVENOUS | Status: AC
  Administered 2020-10-01: 1000 mL via INTRAVENOUS

## 2020-09-30 MED ORDER — PIPERACILLIN-TAZOBACTAM 3.375 G IVPB 30 MIN
3.3750 g | Freq: Once | INTRAVENOUS | Status: DC
  Administered 2020-10-01: 3.375 g via INTRAVENOUS
  Filled 2020-09-30: qty 50

## 2020-09-30 MED ORDER — INSULIN ASPART 100 UNIT/ML ~~LOC~~ SOLN
8.0000 [IU] | Freq: Once | SUBCUTANEOUS | Status: DC
  Administered 2020-10-01: 8 [IU] via INTRAVENOUS
  Filled 2020-09-30: qty 1

## 2020-09-30 NOTE — ED Provider Notes (Signed)
[facility] Emergency Department Provider Note    First MD Initiated Contact with Patient 09/30/20 2256     (approximate)  I have reviewed the triage vital signs and the nursing notes.   HISTORY  Chief Complaint Foot Ulcer    HPI [name] is a 44 y.o. female with the below listed past medical history with known diabetic foot wounds history of osteorequiring amputation previously presents to the ER for worsening ulceration wound of right fifth toe.  Was seen by podiatry clinic last week but her wound has worsened now having purulent drainage no measured fevers.  Is having some discomfort.  States that she spoke with her podiatrist who told her to come to the ER for admission for IV antibiotics and probable amputation.    Past Medical History:  Diagnosis Date  . Bipolar disorder ([place])   . Chronic combined systolic and diastolic CHF (congestive heart failure) ([place])    a. ECHO 04/23/14 LV EF: 45% - 50%,  mild LVH, diffuse hypokinesis. LV diastolic dysfunction, high ventricular filling pressure. Mild LA dilation.  . Cocaine abuse ([place])   . COPD (chronic obstructive pulmonary disease) ([place])   . Diabetes mellitus without complication ([place])   . GERD (gastroesophageal reflux disease)   . Heroin overdose ([place]) 10/2018  . Hypertension   . MRSA infection    arm wound  . Nicotine dependence with current use   . Seizures ([place])    last seizure March 2021; usual 1-2 per month   Family History  Problem Relation Age of Onset  . Hypertension Other   . Drug abuse Mother   . Seizures Neg Hx    Past Surgical History:  Procedure Laterality Date  . ABDOMINAL HYSTERECTOMY    . ACHILLES TENDON SURGERY Left 03/31/2020   Procedure: ACHILLES LENGTHENING;  Surgeon: Caroline More, DPM;  Location: [facility] ORS;  Service: Podiatry;  Laterality: Left;  . AMPUTATION TOE Left 03/23/2019   Procedure: AMPUTATION TOE GREAT LEFT TOE;  Surgeon: [name] Patricia, DPM;  Location: [facility]  ORS;  Service: Podiatry;  Laterality: Left;  . AMPUTATION TOE Left 08/16/2019   Procedure: AMPUTATION TOE 5th;  Surgeon: Caroline More, DPM;  Location: [facility] ORS;  Service: Podiatry;  Laterality: Left;  . AMPUTATION TOE Left 02/05/2020   Procedure: AMPUTATION MPJ LEFT 4TH;  Surgeon: Caroline More, DPM;  Location: [facility] ORS;  Service: Podiatry;  Laterality: Left;  . ANKLE FRACTURE SURGERY Left    pins/screws in place  . CHOLECYSTECTOMY  2005  . FRACTURE SURGERY    . LOWER EXTREMITY ANGIOGRAPHY Left 03/12/2019   Procedure: LOWER EXTREMITY ANGIOGRAPHY;  Surgeon: Katha Cabal, MD;  Location: [facility];  Service: Cardiovascular;  Laterality: Left;  . TEE WITHOUT CARDIOVERSION N/A 03/26/2019   Procedure: TRANSESOPHAGEAL ECHOCARDIOGRAM (TEE);  Surgeon: Nelva Bush, MD;  Location: [facility] ORS;  Service: Cardiovascular;  Laterality: N/A;  . TRANSMETATARSAL AMPUTATION Left 03/31/2020   Procedure: TRANSMETATARSAL AMPUTATION;  Surgeon: Caroline More, DPM;  Location: [facility] ORS;  Service: Podiatry;  Laterality: Left;  . TUBAL LIGATION     Patient Active Problem List   Diagnosis Date Noted  . Cellulitis of left foot   . Pressure injury of skin 04/01/2020  . Osteomyelitis of left foot ([place]) 03/30/2020  . Osteomyelitis of fifth toe of left foot ([place]) 08/15/2019  . Bacteremia   . HTN (hypertension) 03/21/2019  . Chronic combined systolic and diastolic CHF (congestive heart failure) ([place]) 03/21/2019  . Toe osteomyelitis, left ([place])  03/21/2019  . Sepsis ([place]) 03/21/2019  . Atherosclerosis of native arteries of the extremities with ulceration ([place]) 03/04/2019  . Ulcer of great toe, left, with fat layer exposed ([place]) 09/28/2018  . Menometrorrhagia 06/29/2018  . 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 Secondary localized osteoarthrosis of ankle and foot 01/10/2018  . Chronic foot pain, left 02/20/2017  . Leg edema, left 02/20/2017  . 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 Urinary  retention with incomplete bladder emptying 05/02/2014  . 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 Cocaine abuse ([place]) 04/23/2014  . Bipolar disorder, unspecified ([place]) 04/23/2014  . Morbid obesity ([place]) 04/23/2014  . DM type 2 (diabetes mellitus, type 2)- poorly controlled 04/23/2014  . Toxic encephalopathy 04/20/2014  . 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 Lactic acid acidosis 04/20/2014  . 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 HCAP (healthcare-associated pneumonia) 04/20/2014  . Post-traumatic stress disorder, unspecified 03/20/2014  . Severe cannabis use disorder ([place]) 03/20/2014      Prior to Admission medications   Medication Sig Start Date End Date Taking? Authorizing Provider  acetaminophen (TYLENOL) 325 MG tablet Take 2 tablets (650 mg total) by mouth every 6 (six) hours as needed for mild pain (or Fever >/= 101). 04/02/20   Dhungel, Flonnie Overman, MD  amitriptyline (ELAVIL) 150 MG tablet Take 150 mg by mouth at bedtime.    [provider]  atorvastatin (LIPITOR) 20 MG tablet Take 20 mg by mouth at bedtime.     [provider]  bethanechol (URECHOLINE) 25 MG tablet Take 25 mg by mouth 2 (two) times daily.     [provider]  ELIQUIS 2.5 MG TABS tablet Take 1 tablet (2.5 mg total) by mouth 2 (two) times daily. 04/02/20   Dhungel, Flonnie Overman, MD  furosemide (LASIX) 40 MG tablet Take 40 mg by mouth daily.     [provider]  gabapentin (NEURONTIN) 600 MG tablet Take 600 mg by mouth 3 (three) times daily. 03/11/20   [provider]  glipiZIDE (GLUCOTROL) 10 MG tablet Take 10 mg by mouth daily.     [provider]  hydrOXYzine (ATARAX/VISTARIL) 50 MG tablet Take 50 mg by mouth 3 (three) times daily as needed for anxiety or itching.     [provider]  insulin aspart (NOVOLOG) 100 UNIT/ML injection Inject 1 Units into the skin 3 (three) times daily before meals.     [provider]   insulin glargine (LANTUS) 100 UNIT/ML injection Inject 30 Units into the skin 2 (two) times a day.     [provider]  metFORMIN (GLUCOPHAGE) 1000 MG tablet Take 1,000 mg by mouth 2 (two) times daily with a meal.    [provider]  metoprolol tartrate (LOPRESSOR) 50 MG tablet Take 25 mg by mouth 2 (two) times daily.     [provider]  mirtazapine (REMERON) 15 MG tablet Take 7.5-15 mg by mouth at bedtime. 02/29/20   [provider]  pantoprazole (PROTONIX) 20 MG tablet Take 20 mg by mouth daily.  08/21/18   [provider]  prazosin (MINIPRESS) 5 MG capsule Take 5 mg by mouth at bedtime.    [provider]  promethazine (PHENERGAN) 12.5 MG tablet Take 12.5 mg by mouth every 6 (six) hours as needed for nausea or vomiting.     [provider]  QUEtiapine (SEROQUEL) 400 MG tablet Take 800 mg by mouth at bedtime.    [provider]  senna-docusate (  SENOKOT-S) 8.6-50 MG tablet Take 1 tablet by mouth at bedtime as needed for mild constipation. 04/02/20   Dhungel, Flonnie Overman, MD  tamsulosin (FLOMAX) 0.4 MG CAPS capsule Take 0.4 mg by mouth daily.     [provider]  venlafaxine XR (EFFEXOR-XR) 150 MG 24 hr capsule Take 150 mg by mouth at bedtime.     [provider]    Allergies Albumin (human), Erythromycin, and Midecamycin    Social History Social History   Tobacco Use  . Smoking status: Current Every Day Smoker    Packs/day: 1.00    Years: 28.00    Pack years: 28.00    Types: Cigarettes  . Smokeless tobacco: Never Used  Vaping Use  . Vaping Use: Never used  Substance Use Topics  . Alcohol use: Not Currently    Alcohol/week: 0.0 standard drinks    Comment: Quit drinking 11/16 per pt  . Drug use: Not Currently    Comment: last use 5 years ago 2016    Review of Systems Patient denies headaches, rhinorrhea, blurry vision, numbness, shortness of breath, chest pain, edema, cough, abdominal pain,  nausea, vomiting, diarrhea, dysuria, fevers, rashes or hallucinations unless otherwise stated above in HPI. ____________________________________________   PHYSICAL EXAM:  VITAL SIGNS: Vitals:   09/30/20 2134 09/30/20 2311  BP: 103/82 (!) 140/93  Pulse: (!) 103 87  Resp: 18 18  Temp: 98.3 F (36.8 C)   SpO2: 96% 100%    Constitutional: Alert and oriented.  Eyes: Conjunctivae are normal.  Head: Atraumatic. Nose: No congestion/rhinnorhea. Mouth/Throat: Mucous membranes are moist.   Neck: No stridor. Painless ROM.  Cardiovascular: Normal rate, regular rhythm. Grossly normal heart sounds.  Good peripheral circulation. Respiratory: Normal respiratory effort.  No retractions. Lungs CTAB. Gastrointestinal: Soft and nontender. No distention. No abdominal bruits. No CVA tenderness. Genitourinary:  Musculoskeletal: Evidence of ulceration of the right lateral aspect of the foot.  It does have some purulent drainage in between the fourth and fifth toe.  Mild overlying erythema.  Cap refill 3 sec.  No joint effusions. Neurologic:  Normal speech and language. No gross focal neurologic deficits are appreciated. No facial droop Skin:  Skin is warm, dry and intact. No rash noted. Psychiatric: Mood and affect are normal. Speech and behavior are normal.  ____________________________________________   LABS (all labs ordered are listed, but only abnormal results are displayed)  Results for orders placed or performed during the hospital encounter of 09/30/20 (from the past 24 hour(s))  CBC with Differential     Status: Abnormal   Collection Time: 09/30/20  3:34 PM  Result Value Ref Range   WBC 10.3 4.0 - 10.5 K/uL   RBC 5.12 (H) 3.87 - 5.11 MIL/uL   Hemoglobin 14.5 12.0 - 15.0 g/dL   HCT 43.0 36 - 46 %   MCV 84.0 80.0 - 100.0 fL   MCH 28.3 26.0 - 34.0 pg   MCHC 33.7 30.0 - 36.0 g/dL   RDW 12.8 11.5 - 15.5 %   Platelets 372 150 - 400 K/uL   nRBC 0.0 0.0 - 0.2 %   Neutrophils Relative % 85  %   Neutro Abs 8.8 (H) 1.7 - 7.7 K/uL   Lymphocytes Relative 10 %   Lymphs Abs 1.0 0.7 - 4.0 K/uL   Monocytes Relative 3 %   Monocytes Absolute 0.3 0.1 - 1.0 K/uL   Eosinophils Relative 1 %   Eosinophils Absolute 0.1 0.0 - 0.5 K/uL   Basophils Relative 1 %  Basophils Absolute 0.1 0.0 - 0.1 K/uL   Immature Granulocytes 0 %   Abs Immature Granulocytes 0.04 0.00 - 0.07 K/uL  Comprehensive metabolic panel     Status: Abnormal   Collection Time: 09/30/20  4:15 PM  Result Value Ref Range   Sodium 131 (L) 135 - 145 mmol/L   Potassium 3.5 3.5 - 5.1 mmol/L   Chloride 93 (L) 98 - 111 mmol/L   CO2 27 22 - 32 mmol/L   Glucose, Bld 482 (H) 70 - 99 mg/dL   BUN 16 6 - 20 mg/dL   Creatinine, Ser 1.61 (H) 0.44 - 1.00 mg/dL   Calcium 8.8 (L) 8.9 - 10.3 mg/dL   Total Protein 8.0 6.5 - 8.1 g/dL   Albumin 2.9 (L) 3.5 - 5.0 g/dL   AST 26 15 - 41 U/L   ALT 19 0 - 44 U/L   Alkaline Phosphatase 112 38 - 126 U/L   Total Bilirubin 0.5 0.3 - 1.2 mg/dL   GFR, Estimated 40 (L) >60 mL/min   Anion gap 11 5 - 15  CBG monitoring, ED     Status: Abnormal   Collection Time: 09/30/20  9:34 PM  Result Value Ref Range   Glucose-Capillary 313 (H) 70 - 99 mg/dL   Comment 1 Notify RN    Comment 2 Document in Chart    ____________________________________________ ____________________________________________  RADIOLOGY  I personally reviewed all radiographic images ordered to evaluate for the above acute complaints and reviewed radiology reports and findings.  These findings were personally discussed with the patient.  Please see medical record for radiology report.  ____________________________________________   PROCEDURES  Procedure(s) performed:  Procedures    Critical Care performed: no ____________________________________________   INITIAL IMPRESSION / ASSESSMENT AND PLAN / ED COURSE  Pertinent labs & imaging results that were available during my care of the patient were reviewed by me and  considered in my medical decision making (see chart for details).   DDX: Diabetic foot, cellulitis, osteo-, fracture, ischemic ulcer  [name] is a 44 y.o. who presents to the ED with presentation as described above with evidence of ulceration of the right lateral foot in the setting of known diabetes history of osteo-.  Imaging is concerning for osteo-.  Blood work is otherwise reassuring but does have significant hyperglycemia will give IV fluids as well as insulin.  Will give IV Zosyn.  Sadly the plan is for patient to be admitted for operative management by podiatry.  Will discuss with hospitalist for admission.     The patient was evaluated in Emergency Department today for the symptoms described in the history of present illness. He/she was evaluated in the context of the global COVID-19 pandemic, which necessitated consideration that the patient might be at risk for infection with the SARS-CoV-2 virus that causes COVID-19. Institutional protocols and algorithms that pertain to the evaluation of patients at risk for COVID-19 are in a state of rapid change based on information released by regulatory bodies including the CDC and federal and state organizations. These policies and algorithms were followed during the patient's care in the ED.  As part of my medical decision making, I reviewed the following data within the [facility] notes reviewed and incorporated, Labs reviewed, notes from prior ED visits and [place] Controlled Substance Database   ____________________________________________   FINAL CLINICAL IMPRESSION(S) / ED DIAGNOSES  Final diagnoses:  Diabetic ulcer of toe of right foot associated with diabetes mellitus of other  type, with other ulcer severity ([place])      NEW MEDICATIONS STARTED DURING THIS VISIT:  New Prescriptions   No medications on file     Note:  This document was prepared using Dragon voice recognition software and may include  unintentional dictation errors.    Merlyn Lot, MD 09/30/20 2329

## 2020-09-30 NOTE — ED Triage Notes (Signed)
Pt sent to er for eval of foot ulcer  Hx diabetes.  Pt has ulcer between right 4th and 5th toes.  Ulcer for 3 weeks.  Pt wearing cast shoes on both feet   Pt alert  Speech clear.
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
## 2020-09-30 NOTE — ED Notes (Signed)
PXray on left foot obtained.

## 2020-10-01 ENCOUNTER — Inpatient Hospital Stay: Payer: Medicaid Other

## 2020-10-01 ENCOUNTER — Encounter: Payer: Self-pay | Admitting: Internal Medicine

## 2020-10-01 DIAGNOSIS — E785 Hyperlipidemia, unspecified: Secondary | ICD-10-CM

## 2020-10-01 DIAGNOSIS — I5022 Chronic systolic (congestive) heart failure: Secondary | ICD-10-CM

## 2020-10-01 LAB — CBC
HCT: 36.9 % (ref 36.0–46.0)
Hemoglobin: 12.3 g/dL (ref 12.0–15.0)
MCH: 28.4 pg (ref 26.0–34.0)
MCHC: 33.3 g/dL (ref 30.0–36.0)
MCV: 85.2 fL (ref 80.0–100.0)
Platelets: 417 10*3/uL — ABNORMAL HIGH (ref 150–400)
RBC: 4.33 MIL/uL (ref 3.87–5.11)
RDW: 13 % (ref 11.5–15.5)
WBC: 9.9 10*3/uL (ref 4.0–10.5)
nRBC: 0 % (ref 0.0–0.2)

## 2020-10-01 LAB — GLUCOSE, CAPILLARY
Glucose-Capillary: 224 mg/dL — ABNORMAL HIGH (ref 70–99)
Glucose-Capillary: 102 mg/dL — ABNORMAL HIGH (ref 70–99)

## 2020-10-01 LAB — CBG MONITORING, ED: Glucose-Capillary: 317 mg/dL — ABNORMAL HIGH (ref 70–99)

## 2020-10-01 MED ORDER — ONDANSETRON HCL 4 MG/2ML IJ SOLN
4.0000 mg | Freq: Four times a day (QID) | INTRAMUSCULAR | Status: DC | PRN
  Administered 2020-10-05: 4 mg via INTRAVENOUS
  Filled 2020-10-01: qty 2

## 2020-10-01 MED ORDER — SODIUM CHLORIDE 0.9% FLUSH: 10.0000 mL | INTRAVENOUS | Status: DC | PRN

## 2020-10-01 MED ORDER — ATORVASTATIN CALCIUM 20 MG PO TABS
20.0000 mg | ORAL_TABLET | Freq: Every day | ORAL | Status: DC
  Administered 2020-10-01 – 2020-10-06 (×6): 20 mg via ORAL
  Filled 2020-10-01 (×6): qty 1

## 2020-10-01 MED ORDER — SENNOSIDES-DOCUSATE SODIUM 8.6-50 MG PO TABS: 1.0000 | ORAL_TABLET | Freq: Every evening | ORAL | Status: DC | PRN

## 2020-10-01 MED ORDER — INSULIN GLARGINE 100 UNIT/ML ~~LOC~~ SOLN
15.0000 [IU] | Freq: Two times a day (BID) | SUBCUTANEOUS | Status: DC
  Administered 2020-10-01: 15 [IU] via SUBCUTANEOUS
  Filled 2020-10-01 (×3): qty 0.15

## 2020-10-01 MED ORDER — INSULIN ASPART 100 UNIT/ML ~~LOC~~ SOLN
0.0000 [IU] | Freq: Three times a day (TID) | SUBCUTANEOUS | Status: DC
  Administered 2020-10-01: 11 [IU] via SUBCUTANEOUS
  Administered 2020-10-02: 5 [IU] via SUBCUTANEOUS
  Administered 2020-10-03: 2 [IU] via SUBCUTANEOUS
  Administered 2020-10-03: 3 [IU] via SUBCUTANEOUS
  Administered 2020-10-03: 8 [IU] via SUBCUTANEOUS
  Administered 2020-10-04: 2 [IU] via SUBCUTANEOUS
  Administered 2020-10-04: 8 [IU] via SUBCUTANEOUS
  Administered 2020-10-05: 2 [IU] via SUBCUTANEOUS
  Administered 2020-10-05: 5 [IU] via SUBCUTANEOUS
  Administered 2020-10-06 – 2020-10-07 (×2): 2 [IU] via SUBCUTANEOUS
  Filled 2020-10-01 (×11): qty 1

## 2020-10-01 MED ORDER — BETHANECHOL CHLORIDE 25 MG PO TABS
25.0000 mg | ORAL_TABLET | Freq: Two times a day (BID) | ORAL | Status: DC
  Administered 2020-10-01 – 2020-10-07 (×10): 25 mg via ORAL
  Filled 2020-10-01 (×15): qty 1

## 2020-10-01 MED ORDER — ONDANSETRON HCL 4 MG PO TABS: 4.0000 mg | ORAL_TABLET | Freq: Four times a day (QID) | ORAL | Status: DC | PRN

## 2020-10-01 MED ORDER — INSULIN ASPART 100 UNIT/ML ~~LOC~~ SOLN
28.0000 [IU] | Freq: Once | SUBCUTANEOUS | Status: AC
  Administered 2020-10-01: 28 [IU] via SUBCUTANEOUS
  Filled 2020-10-01: qty 1

## 2020-10-01 MED ORDER — NICOTINE 21 MG/24HR TD PT24
21.0000 mg | MEDICATED_PATCH | Freq: Every day | TRANSDERMAL | Status: DC
  Administered 2020-10-01 – 2020-10-07 (×7): 21 mg via TRANSDERMAL
  Filled 2020-10-01 (×7): qty 1

## 2020-10-01 MED ORDER — INSULIN ASPART 100 UNIT/ML ~~LOC~~ SOLN: 8.0000 [IU] | Freq: Three times a day (TID) | SUBCUTANEOUS | Status: DC

## 2020-10-01 MED ORDER — POTASSIUM CHLORIDE CRYS ER 20 MEQ PO TBCR
40.0000 meq | EXTENDED_RELEASE_TABLET | Freq: Once | ORAL | Status: AC
  Administered 2020-10-01: 40 meq via ORAL
  Filled 2020-10-01: qty 2

## 2020-10-01 MED ORDER — INSULIN GLARGINE 100 UNIT/ML ~~LOC~~ SOLN
30.0000 [IU] | Freq: Two times a day (BID) | SUBCUTANEOUS | Status: DC
  Administered 2020-10-01: 30 [IU] via SUBCUTANEOUS
  Filled 2020-10-01 (×2): qty 0.3

## 2020-10-01 MED ORDER — TAMSULOSIN HCL 0.4 MG PO CAPS
0.4000 mg | ORAL_CAPSULE | Freq: Every day | ORAL | Status: DC
  Administered 2020-10-01 – 2020-10-06 (×6): 0.4 mg via ORAL
  Filled 2020-10-01 (×6): qty 1

## 2020-10-01 MED ORDER — AMITRIPTYLINE HCL 75 MG PO TABS
150.0000 mg | ORAL_TABLET | Freq: Every day | ORAL | Status: DC
  Administered 2020-10-01 – 2020-10-06 (×6): 150 mg via ORAL
  Filled 2020-10-01: qty 6
  Filled 2020-10-01: qty 2
  Filled 2020-10-01: qty 6
  Filled 2020-10-01 (×2): qty 2
  Filled 2020-10-01: qty 6
  Filled 2020-10-01 (×2): qty 2

## 2020-10-01 MED ORDER — SODIUM CHLORIDE 0.9 % IV SOLN
2.0000 g | INTRAVENOUS | Status: DC
  Administered 2020-10-01 – 2020-10-02 (×2): 2 g via INTRAVENOUS
  Filled 2020-10-01: qty 2
  Filled 2020-10-01 (×2): qty 20

## 2020-10-01 MED ORDER — SODIUM CHLORIDE 0.9% FLUSH
10.0000 mL | Freq: Two times a day (BID) | INTRAVENOUS | Status: DC
  Administered 2020-10-01 – 2020-10-07 (×10): 10 mL

## 2020-10-01 MED ORDER — METRONIDAZOLE 500 MG PO TABS
500.0000 mg | ORAL_TABLET | Freq: Three times a day (TID) | ORAL | Status: DC
  Administered 2020-10-01 – 2020-10-07 (×13): 500 mg via ORAL
  Filled 2020-10-01 (×15): qty 1

## 2020-10-01 MED ORDER — GABAPENTIN 600 MG PO TABS
600.0000 mg | ORAL_TABLET | Freq: Three times a day (TID) | ORAL | Status: DC
  Administered 2020-10-01 – 2020-10-07 (×16): 600 mg via ORAL
  Filled 2020-10-01 (×16): qty 1

## 2020-10-01 MED ORDER — MIRTAZAPINE 15 MG PO TABS
15.0000 mg | ORAL_TABLET | Freq: Every day | ORAL | Status: DC
  Administered 2020-10-01 – 2020-10-06 (×6): 15 mg via ORAL
  Filled 2020-10-01 (×6): qty 1

## 2020-10-01 MED ORDER — ACETAMINOPHEN 650 MG RE SUPP
650.0000 mg | Freq: Four times a day (QID) | RECTAL | Status: DC | PRN
  Filled 2020-10-01: qty 1

## 2020-10-01 MED ORDER — HEPARIN SODIUM (PORCINE) 5000 UNIT/ML IJ SOLN
5000.0000 [IU] | Freq: Three times a day (TID) | INTRAMUSCULAR | Status: DC
  Administered 2020-10-01 – 2020-10-05 (×9): 5000 [IU] via SUBCUTANEOUS
  Filled 2020-10-01 (×9): qty 1

## 2020-10-01 MED ORDER — CHLORHEXIDINE GLUCONATE 4 % EX LIQD: 60.0000 mL | Freq: Once | CUTANEOUS | Status: DC

## 2020-10-01 MED ORDER — INSULIN ASPART 100 UNIT/ML ~~LOC~~ SOLN
0.0000 [IU] | Freq: Every day | SUBCUTANEOUS | Status: DC
  Administered 2020-10-02: 4 [IU] via SUBCUTANEOUS
  Administered 2020-10-03: 2 [IU] via SUBCUTANEOUS
  Administered 2020-10-05: 5 [IU] via SUBCUTANEOUS
  Administered 2020-10-06: 2 [IU] via SUBCUTANEOUS
  Filled 2020-10-01 (×4): qty 1

## 2020-10-01 MED ORDER — PRAZOSIN HCL 5 MG PO CAPS
5.0000 mg | ORAL_CAPSULE | Freq: Every day | ORAL | Status: DC
  Administered 2020-10-01 – 2020-10-04 (×4): 5 mg via ORAL
  Filled 2020-10-01 (×5): qty 1

## 2020-10-01 MED ORDER — QUETIAPINE FUMARATE 300 MG PO TABS
800.0000 mg | ORAL_TABLET | Freq: Every day | ORAL | Status: DC
  Administered 2020-10-01 – 2020-10-05 (×5): 800 mg via ORAL
  Filled 2020-10-01 (×7): qty 1

## 2020-10-01 MED ORDER — PANTOPRAZOLE SODIUM 20 MG PO TBEC
20.0000 mg | DELAYED_RELEASE_TABLET | Freq: Every day | ORAL | Status: DC
  Administered 2020-10-01 – 2020-10-07 (×4): 20 mg via ORAL
  Filled 2020-10-01 (×7): qty 1

## 2020-10-01 MED ORDER — HYDROXYZINE HCL 50 MG PO TABS
50.0000 mg | ORAL_TABLET | Freq: Three times a day (TID) | ORAL | Status: DC | PRN
  Administered 2020-10-01 – 2020-10-03 (×2): 50 mg via ORAL
  Filled 2020-10-01 (×3): qty 1

## 2020-10-01 MED ORDER — ACETAMINOPHEN 325 MG PO TABS
650.0000 mg | ORAL_TABLET | Freq: Four times a day (QID) | ORAL | Status: DC | PRN
  Administered 2020-10-04: 650 mg via ORAL
  Filled 2020-10-01: qty 2

## 2020-10-01 MED ORDER — POVIDONE-IODINE 10 % EX SWAB
2.0000 "application " | Freq: Once | CUTANEOUS | Status: DC
  Filled 2020-10-01: qty 2

## 2020-10-01 MED ORDER — ALBUTEROL SULFATE HFA 108 (90 BASE) MCG/ACT IN AERS
1.0000 | INHALATION_SPRAY | RESPIRATORY_TRACT | Status: DC | PRN
  Filled 2020-10-01: qty 6.7

## 2020-10-01 MED ORDER — VENLAFAXINE HCL ER 150 MG PO CP24
300.0000 mg | ORAL_CAPSULE | Freq: Every day | ORAL | Status: DC
  Administered 2020-10-01 – 2020-10-06 (×6): 300 mg via ORAL
  Filled 2020-10-01 (×8): qty 2

## 2020-10-01 MED ORDER — FUROSEMIDE 40 MG PO TABS
40.0000 mg | ORAL_TABLET | Freq: Every day | ORAL | Status: DC
  Administered 2020-10-01 – 2020-10-03 (×2): 40 mg via ORAL
  Filled 2020-10-01 (×2): qty 1

## 2020-10-01 MED ORDER — METOPROLOL TARTRATE 25 MG PO TABS
25.0000 mg | ORAL_TABLET | Freq: Two times a day (BID) | ORAL | Status: DC
  Administered 2020-10-01 – 2020-10-02 (×3): 25 mg via ORAL
  Filled 2020-10-01 (×3): qty 1

## 2020-10-01 MED ORDER — INSULIN GLARGINE 100 UNIT/ML ~~LOC~~ SOLN: 30.0000 [IU] | Freq: Two times a day (BID) | SUBCUTANEOUS | Status: DC

## 2020-10-01 NOTE — TOC Initial Note (Addendum)
Transition of Care [facility]) - Initial/Assessment Note    Patient Details  Name: [name] MRN: [id number] Date of Birth: 05/30/1976  Transition of Care [facility]) CM/SW Contact:    Ova Freshwater Phone Number: [phone] 10/01/2020, 2:49 PM  Clinical Narrative:                  Patient [facility] ED due to foot pain.  CSW explained role of TOC in patient care.  Patient stated she lives with cousin. Patient stated she uses a walker for ambulation but has had difficulty using it in the last few days due to her foot pain. Patient stated she is able to perform all ADLs but sometimes needs assistance when taking a shower.  Patient is not typically incontinent but sometimes has trouble making it to the bathroom on time due to her foot.Patietn's PCP is Dr. Heber [place] in [place].Patient stated she prefers home health and does not want SNF.  Expected Discharge Plan: [place] Barriers to Discharge: Continued Medical Work up   Patient Goals and CMS Choice Patient states their goals for this hospitalization and ongoing recovery are:: "To go back home."      Expected Discharge Plan and Services Expected Discharge Plan: [place] In-house Referral: Clinical Social Work   Post Acute Care Choice: [place] arrangements for the past 2 months: [place]                                      Prior Living Arrangements/Services Living arrangements for the past 2 months: Single Family Home Lives with:: Relatives Patient language and need for interpreter reviewed:: Yes Do you feel safe going back to the place where you live?: Yes      Need for Family Participation in Patient Care: Yes (Comment) Care giver support system in place?: Yes (comment)   Criminal Activity/Legal Involvement Pertinent to Current Situation/Hospitalization: No - Comment as needed  Activities of Daily Living      Permission  Sought/Granted Permission sought to share information with : Family Supports Permission granted to share information with : Yes, Verbal Permission Granted  Share Information with NAME: Comer,Cindy (Mother)   [phone]           Emotional Assessment Appearance:: Appears older than stated age Attitude/Demeanor/Rapport: Engaged Affect (typically observed): Stable Orientation: : Oriented to Self,Oriented to Place,Oriented to  Time,Oriented to Situation Alcohol / Substance Use: Not Applicable Psych Involvement: No (comment)  Admission diagnosis:  Osteomyelitis of fourth toe of right foot [facility]) [M86.9] Patient Active Problem List   Diagnosis Date Noted  . Hyperlipidemia   . Osteomyelitis of metatarsal ([place]) 09/30/2020  . Hyperlipidemia associated with type 2 diabetes mellitus ([place]) 09/30/2020  . Substance use disorder 09/30/2020  . CKD stage 3 due to type 2 diabetes mellitus ([place]) 09/30/2020  . Cellulitis of left foot   . Pressure injury of skin 04/01/2020  . Osteomyelitis of left foot ([place]) 03/30/2020  . Osteomyelitis of fifth toe of left foot ([place]) 08/15/2019  . Bacteremia   . Essential hypertension 03/21/2019  . Chronic systolic CHF (congestive heart failure) ([place]) 03/21/2019  . Toe osteomyelitis, left ([place]) 03/21/2019  . Sepsis ([place]) 03/21/2019  . Atherosclerosis of native arteries of the extremities with ulceration ([place]) 03/04/2019  . Ulcer of great toe, left, with fat layer exposed ([employer]) 09/28/2018  .  Menometrorrhagia 06/29/2018  . 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 Secondary localized osteoarthrosis of ankle and foot 01/10/2018  . Chronic foot pain, left 02/20/2017  . Leg edema, left 02/20/2017  . 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 Urinary retention with incomplete bladder emptying 05/02/2014  . 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 Cocaine abuse ([place]) 04/23/2014  . Bipolar disorder, unspecified ([place]) 04/23/2014   . Morbid obesity ([place]) 04/23/2014  . DM type 2 (diabetes mellitus, type 2)- poorly controlled 04/23/2014  . Toxic encephalopathy 04/20/2014  . 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 Lactic acid acidosis 04/20/2014  . Paraparesis of both lower limbs ([place]) 04/20/2014  . HCAP (healthcare-associated pneumonia) 04/20/2014  . Post-traumatic stress disorder, unspecified 03/20/2014  . Severe cannabis use disorder ([place]) 03/20/2014   PCP:  Care, [facility] Pharmacy:   [facility] [address] [place] [address] Phone: [phone] Fax: [place], [place] [address] Phone: [phone] Fax: [place], [place] - [address] AT [address] [place] [address] Phone: [phone] Fax: [phone]  [place], [place] - [place] [address] Phone: [phone] Fax: [phone]     Social Determinants of Health (SDOH) Interventions    Readmission Risk Interventions No flowsheet data found.
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
## 2020-10-01 NOTE — ED Notes (Signed)
Secure chat to recieving nurse.

## 2020-10-01 NOTE — ED Notes (Signed)
Pt resting comfortably at this time. Pt provided water. VSS. Call bell in reach. Pt denies any further needs.

## 2020-10-01 NOTE — Progress Notes (Signed)
Patient ID: [name], female   DOB: 03-30-1976, 44 y.o.   MRN: [id number] [facility] PROGRESS NOTE  THERESEA TRAUTMANN WCB:[id number] DOB: 12-14-1975 DOA: 09/30/2020 PCP: [facility]  HPI/Subjective: Patient feeling okay.  States she had an accident after given Lasix today.  Came in with drainage from the right foot and pain.  Objective: Vitals:   10/01/20 1018 10/01/20 1032  BP: 129/81 120/82  Pulse: (!) 108 (!) 107  Resp:  20  Temp:    SpO2:  99%   No intake or output data in the 24 hours ending 10/01/20 1401 Filed Weights   09/30/20 1527 09/30/20 1531  Weight: 88.5 kg 88.5 kg    ROS: Review of Systems  Respiratory: Negative for cough and shortness of breath.   Cardiovascular: Negative for chest pain.  Gastrointestinal: Negative for abdominal pain.  Musculoskeletal: Positive for joint pain.   Exam: Physical Exam HENT:     Head: Normocephalic.     Mouth/Throat:     Pharynx: No oropharyngeal exudate.  Eyes:     General: Lids are normal.     Conjunctiva/sclera: Conjunctivae normal.     Pupils: Pupils are equal, round, and reactive to light.  Cardiovascular:     Rate and Rhythm: Normal rate and regular rhythm.     Heart sounds: Normal heart sounds, S1 normal and S2 normal.  Pulmonary:     Breath sounds: No decreased breath sounds, wheezing, rhonchi or rales.  Abdominal:     Palpations: Abdomen is soft.     Tenderness: There is no abdominal tenderness.  Musculoskeletal:     Right lower leg: No swelling.     Left lower leg: No swelling.  Skin:    General: Skin is warm.     Comments: Some drainage in between fourth and fifth toe.  Small ulceration bottom of the foot.  Neurological:     Mental Status: She is alert and oriented to person, place, and time.       Data Reviewed: Basic Metabolic Panel: Recent Labs  Lab 09/30/20 1615  NA 131*  K 3.5  CL 93*  CO2 27  GLUCOSE 482*  BUN 16  CREATININE 1.61*  CALCIUM 8.8*   Liver Function  Tests: Recent Labs  Lab 09/30/20 1615  AST 26  ALT 19  ALKPHOS 112  BILITOT 0.5  PROT 8.0  ALBUMIN 2.9*    CBC: Recent Labs  Lab 09/30/20 1534  WBC 10.3  NEUTROABS 8.8*  HGB 14.5  HCT 43.0  MCV 84.0  PLT 372    CBG: Recent Labs  Lab 09/30/20 2134  GLUCAP 313*     Studies: DG Ankle Complete Right  Result Date: 10/01/2020 CLINICAL DATA:  Ulcer between the fourth and fifth toes. EXAM: RIGHT ANKLE - COMPLETE 3+ VIEW COMPARISON:  Foot radiographs from yesterday and MRI from today. CT ankle 07/20/2020 FINDINGS: Remote healing distal fibula medial malleolar fractures. The ankle mortise is maintained. Mild ankle joint degenerative changes and remote healed osteochondral lesion or bone infarct. No definite ankle joint effusion. The mid and hindfoot bony structures are intact. No destructive changes. There is a pathologic fracture/dislocation involving the fourth toe. IMPRESSION: 1. Healing distal fibula and medial malleolar fractures. 2. Pathologic fracture/dislocation at the fourth MTP joint. Electronically Signed   By: Marijo Sanes M.D.   On: 10/01/2020 08:36   MR FOOT RIGHT WO CONTRAST  Result Date: 10/01/2020 CLINICAL DATA:  Worsening ulceration of the fifth digit EXAM: MRI OF THE  RIGHT FOREFOOT WITHOUT CONTRAST TECHNIQUE: Multiplanar, multisequence MR imaging of the right was performed. No intravenous contrast was administered. COMPARISON:  Radiograph same day FINDINGS: Bones/Joint/Cartilage There is cortical destruction with surrounding periosteal reaction with T2 bright/T1 hypointense signal within the fourth metatarsal head. There is also a T2 hyperintense signal with T1 hypointensity and irregularity at the MTP joint surrounding the proximal fourth phalanx. T2 hyperintense signal seen throughout the fifth proximal phalanx and fifth metatarsal head without T1 hypointensity. Ligaments The Lisfranc ligaments are intact. Muscles and Tendons Diffusely increased signal with atrophy  seen within the musculature. The flexor and extensor tendons are grossly intact. Soft tissues There is a area of ulceration seen along the dorsum of the foot between the fourth and fifth metatarsal heads. There is surrounding skin thickening and diffuse subcutaneous edema. No loculated fluid collections however are noted. IMPRESSION: Area of ulceration on the dorsum of the foot above the fourth metatarsal head with findings consistent with acute osteomyelitis involving the fourth metatarsal head and proximal phalanx. No subcutaneous abscess. Probable reactive marrow of the fifth metatarsal head and proximal phalanx. Electronically Signed   By: Prudencio Pair M.D.   On: 10/01/2020 03:08   DG Foot Complete Right  Result Date: 09/30/2020 CLINICAL DATA:  Right fifth toe infection, evaluate for osteomyelitis. History of diabetes. Foot ulcer the twin fourth and fifth toes. EXAM: RIGHT FOOT COMPLETE - 3+ VIEW COMPARISON:  X-ray right foot 03/19/2020. FINDINGS: Lucency and cortical destruction of the base and proximal body of the fourth proximal phalanx. Lucency and cortical destruction of the head of the fourth metatarsal. Associated subluxation at the fourth metatarsophalangeal joint. Subcutaneus soft tissue edema. Known ulceration not visualized on this study. Remainder of the bones of the right foot demonstrate no acute displaced fracture or dislocation. No other regions of cortical erosion or destruction. Similar-appearing cortical irregularity along the neck lateral aspect of the fifth metatarsal. Chronic medial malleolar fracture. IMPRESSION: Osteomyelitis of the right fourth digit metatarsal and proximal phalanx. Associated subluxation of the fourth metatarsophalangeal joint. Electronically Signed   By: Iven Finn M.D.   On: 09/30/2020 23:23    Scheduled Meds: . amitriptyline  150 mg Oral QHS  . atorvastatin  20 mg Oral q1800  . bethanechol  25 mg Oral BID  . furosemide  40 mg Oral Daily  . gabapentin   600 mg Oral TID  . heparin  5,000 Units Subcutaneous Q8H  . insulin aspart  0-15 Units Subcutaneous TID WC  . insulin aspart  0-5 Units Subcutaneous QHS  . insulin glargine  15 Units Subcutaneous BID  . metoprolol tartrate  25 mg Oral BID  . metroNIDAZOLE  500 mg Oral Q8H  . mirtazapine  15 mg Oral QHS  . nicotine  21 mg Transdermal Daily  . pantoprazole  20 mg Oral Daily  . prazosin  5 mg Oral QHS  . QUEtiapine  800 mg Oral QHS  . sodium chloride flush  10-40 mL Intracatheter Q12H  . tamsulosin  0.4 mg Oral QHS  . venlafaxine XR  300 mg Oral QHS   Continuous Infusions: . cefTRIAXone (ROCEPHIN)  IV Stopped (10/01/20 1107)    Assessment/Plan:  1. Osteomyelitis of the fourth metatarsal.  We will get a wound culture.  Was empirically started on antibiotics last night.  Seen by podiatry and they plan on doing a fourth and fifth ray amputation tomorrow.  Vascular team ordered an ABI. 2. 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  On half dose glargine insulin since was n.p.o. today.  We will add short acting insulin prior to meals.  Hemoglobin A1c pending. 3. Hyperlipidemia unspecified on atorvastatin 4. Chronic systolic congestive heart failure.  Patient on oral Lasix and metoprolol. 5. Essential hypertension on metoprolol 6. COPD.  On albuterol as needed 7. Bipolar disorder.  Continue psychiatric medications     Code Status:     Code Status Orders  (From admission, onward)         Start     Ordered   10/01/20 0040  Full code  Continuous        10/01/20 0040        Code Status History    Date Active Date Inactive Code Status Order ID Comments User Context   03/30/2020 2051 04/02/2020 2119 Full Code [id number]  Harvie Bridge, DO Inpatient   08/15/2019 1941 08/18/2019 1431 Full Code [id number]  Lang Snow, NP Inpatient   03/21/2019 2224 03/28/2019 1915 Full Code [id number]  Lance Coon, MD Inpatient   03/12/2019 1026 03/12/2019 1623 Full  Code [id number]  Katha Cabal, MD Inpatient   04/28/2014 1601 05/14/2014 1403 Full Code [id number]  Flora Lipps Inpatient   04/20/2014 1544 04/28/2014 1601 Full Code [id number]  Donita Brooks, NP ED   Advance Care Planning Activity     Family Communication: Left message for the patient's mother on the phone Disposition Plan: Status is: Inpatient  Dispo: The patient is from: Home              Anticipated d/c is to: Home              Anticipated d/c date is: Likely will need 3 days of IV antibiotic here in the hospital and may need IV antibiotics at home depending on surgical procedure tomorrow              Patient currently being treated for osteomyelitis with IV antibiotics  Consultants:  Podiatry  Antibiotics:  Rocephin  Flagyl  Time spent: 28 minutes  [place]

## 2020-10-01 NOTE — Plan of Care (Signed)
  Problem: Education: Goal: Knowledge of General Education information will improve Description: Including pain rating scale, medication(s)/side effects and non-pharmacologic comfort measures Outcome: Progressing   Problem: Health Behavior/Discharge Planning: Goal: Ability to manage health-related needs will improve Outcome: Progressing   Problem: Clinical Measurements: Goal: Ability to maintain clinical measurements within normal limits will improve Outcome: Progressing Goal: Will remain free from infection Outcome: Progressing Goal: Diagnostic test results will improve Outcome: Progressing Goal: Respiratory complications will improve Outcome: Progressing Goal: Cardiovascular complication will be avoided Outcome: Progressing   Problem: Activity: Goal: Risk for activity intolerance will decrease Outcome: Progressing   Problem: Nutrition: Goal: Adequate nutrition will be maintained Outcome: Progressing   Problem: Coping: Goal: Level of anxiety will decrease Outcome: Progressing   Problem: Elimination: Goal: Will not experience complications related to bowel motility Outcome: Progressing Goal: Will not experience complications related to urinary retention Outcome: Progressing   Problem: Pain Managment: Goal: General experience of comfort will improve Outcome: Progressing   Problem: Safety: Goal: Ability to remain free from injury will improve Outcome: Progressing   Problem: Skin Integrity: Goal: Risk for impaired skin integrity will decrease Outcome: Progressing   Problem: Education: Goal: Ability to describe self-care measures that may prevent or decrease complications (Diabetes Survival Skills Education) will improve Outcome: Progressing Goal: Individualized Educational Video(s) Outcome: Progressing   Problem: Coping: Goal: Ability to adjust to condition or change in health will improve Outcome: Progressing   Problem: Fluid Volume: Goal: Ability to  maintain a balanced intake and output will improve Outcome: Progressing   Problem: Health Behavior/Discharge Planning: Goal: Ability to identify and utilize available resources and services will improve Outcome: Progressing Goal: Ability to manage health-related needs will improve Outcome: Progressing   Problem: Metabolic: Goal: Ability to maintain appropriate glucose levels will improve Outcome: Progressing   Problem: Nutritional: Goal: Maintenance of adequate nutrition will improve Outcome: Progressing Goal: Progress toward achieving an optimal weight will improve Outcome: Progressing   
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
## 2020-10-01 NOTE — ED Notes (Signed)
Admitting MD in to see pt.

## 2020-10-02 ENCOUNTER — Encounter: Payer: Self-pay | Admitting: Internal Medicine

## 2020-10-02 ENCOUNTER — Inpatient Hospital Stay: Payer: Medicaid Other | Admitting: Certified Registered"

## 2020-10-02 ENCOUNTER — Encounter
Admission: EM | Disposition: A | Discharge status: Home Health | Payer: Self-pay | Source: Home / Self Care | Attending: Internal Medicine

## 2020-10-02 DIAGNOSIS — N183 Chronic kidney disease, stage 3 unspecified: Secondary | ICD-10-CM

## 2020-10-02 DIAGNOSIS — E11628 Type 2 diabetes mellitus with other skin complications: Secondary | ICD-10-CM

## 2020-10-02 DIAGNOSIS — L89892 Pressure ulcer of other site, stage 2: Secondary | ICD-10-CM

## 2020-10-02 DIAGNOSIS — E1100 Type 2 diabetes mellitus with hyperosmolarity without nonketotic hyperglycemic-hyperosmolar coma (NKHHC): Secondary | ICD-10-CM

## 2020-10-02 DIAGNOSIS — E1169 Type 2 diabetes mellitus with other specified complication: Secondary | ICD-10-CM

## 2020-10-02 DIAGNOSIS — E1122 Type 2 diabetes mellitus with diabetic chronic kidney disease: Secondary | ICD-10-CM

## 2020-10-02 DIAGNOSIS — L089 Local infection of the skin and subcutaneous tissue, unspecified: Secondary | ICD-10-CM

## 2020-10-02 DIAGNOSIS — M869 Osteomyelitis, unspecified: Secondary | ICD-10-CM

## 2020-10-02 LAB — CBC
HCT: 32.7 % — ABNORMAL LOW (ref 36.0–46.0)
Hemoglobin: 10.7 g/dL — ABNORMAL LOW (ref 12.0–15.0)
MCH: 28.1 pg (ref 26.0–34.0)
MCHC: 32.7 g/dL (ref 30.0–36.0)
MCV: 85.8 fL (ref 80.0–100.0)
Platelets: 359 10*3/uL (ref 150–400)
RBC: 3.81 MIL/uL — ABNORMAL LOW (ref 3.87–5.11)
RDW: 13.2 % (ref 11.5–15.5)
WBC: 8.6 10*3/uL (ref 4.0–10.5)
nRBC: 0 % (ref 0.0–0.2)

## 2020-10-02 LAB — URINE DRUG SCREEN, QUALITATIVE (ARMC ONLY)
Amphetamines, Ur Screen: NOT DETECTED
Barbiturates, Ur Screen: NOT DETECTED
Benzodiazepine, Ur Scrn: NOT DETECTED
Cocaine Metabolite,Ur ~~LOC~~: POSITIVE — AB
MDMA (Ecstasy)Ur Screen: NOT DETECTED
Methadone Scn, Ur: NOT DETECTED
Opiate, Ur Screen: NOT DETECTED
Phencyclidine (PCP) Ur S: NOT DETECTED
Tricyclic, Ur Screen: POSITIVE — AB

## 2020-10-02 LAB — BASIC METABOLIC PANEL
Anion gap: 9 (ref 5–15)
BUN: 19 mg/dL (ref 6–20)
CO2: 26 mmol/L (ref 22–32)
Calcium: 8.8 mg/dL — ABNORMAL LOW (ref 8.9–10.3)
Chloride: 102 mmol/L (ref 98–111)
Creatinine, Ser: 1.35 mg/dL — ABNORMAL HIGH (ref 0.44–1.00)
GFR, Estimated: 49 mL/min — ABNORMAL LOW (ref >60)
Glucose, Bld: 97 mg/dL (ref 70–99)
Potassium: 3.7 mmol/L (ref 3.5–5.1)
Sodium: 137 mmol/L (ref 135–145)

## 2020-10-02 LAB — GLUCOSE, CAPILLARY
Glucose-Capillary: 102 mg/dL — ABNORMAL HIGH (ref 70–99)
Glucose-Capillary: 110 mg/dL — ABNORMAL HIGH (ref 70–99)
Glucose-Capillary: 113 mg/dL — ABNORMAL HIGH (ref 70–99)
Glucose-Capillary: 226 mg/dL — ABNORMAL HIGH (ref 70–99)
Glucose-Capillary: 341 mg/dL — ABNORMAL HIGH (ref 70–99)

## 2020-10-02 LAB — IRON AND TIBC
Iron: 26 ug/dL — ABNORMAL LOW (ref 28–170)
Saturation Ratios: 15 % (ref 10.4–31.8)
TIBC: 169 ug/dL — ABNORMAL LOW (ref 250–450)
UIBC: 143 ug/dL

## 2020-10-02 LAB — HEMOGLOBIN A1C
Hgb A1c MFr Bld: 12.2 % — ABNORMAL HIGH (ref 4.8–5.6)
Mean Plasma Glucose: 303.44 mg/dL

## 2020-10-02 LAB — PREALBUMIN: Prealbumin: 9.7 mg/dL — ABNORMAL LOW (ref 18–38)

## 2020-10-02 LAB — HIV ANTIBODY (ROUTINE TESTING W REFLEX): HIV Screen 4th Generation wRfx: NONREACTIVE

## 2020-10-02 SURGERY — AMPUTATION, FOOT, RAY
Anesthesia: General

## 2020-10-02 MED ORDER — SODIUM CHLORIDE 0.9 % IV SOLN
INTRAVENOUS | Status: DC | PRN
  Administered 2020-10-02: 1000 mL via INTRAVENOUS

## 2020-10-02 MED ORDER — PROPOFOL 10 MG/ML IV BOLUS
INTRAVENOUS | Status: AC
  Filled 2020-10-02: qty 20

## 2020-10-02 MED ORDER — CEFAZOLIN SODIUM-DEXTROSE 2-4 GM/100ML-% IV SOLN
2.0000 g | Freq: Three times a day (TID) | INTRAVENOUS | Status: DC
  Administered 2020-10-03 – 2020-10-07 (×11): 2 g via INTRAVENOUS
  Filled 2020-10-02 (×15): qty 100

## 2020-10-02 MED ORDER — INSULIN GLARGINE 100 UNIT/ML ~~LOC~~ SOLN
30.0000 [IU] | Freq: Two times a day (BID) | SUBCUTANEOUS | Status: DC
  Filled 2020-10-02: qty 0.3

## 2020-10-02 MED ORDER — MIDAZOLAM HCL 2 MG/2ML IJ SOLN
INTRAMUSCULAR | Status: AC
  Filled 2020-10-02: qty 2

## 2020-10-02 MED ORDER — ENSURE MAX PROTEIN PO LIQD
11.0000 [oz_av] | Freq: Every day | ORAL | Status: DC
  Administered 2020-10-03 – 2020-10-07 (×2): 11 [oz_av] via ORAL
  Filled 2020-10-02: qty 330

## 2020-10-02 MED ORDER — INSULIN GLARGINE 100 UNIT/ML ~~LOC~~ SOLN
25.0000 [IU] | Freq: Two times a day (BID) | SUBCUTANEOUS | Status: DC
  Administered 2020-10-02 – 2020-10-05 (×6): 25 [IU] via SUBCUTANEOUS
  Filled 2020-10-02 (×8): qty 0.25

## 2020-10-02 MED ORDER — FENTANYL CITRATE (PF) 100 MCG/2ML IJ SOLN
INTRAMUSCULAR | Status: AC
  Filled 2020-10-02: qty 2

## 2020-10-02 MED ORDER — CARVEDILOL 3.125 MG PO TABS
6.2500 mg | ORAL_TABLET | Freq: Two times a day (BID) | ORAL | Status: DC
  Administered 2020-10-02 – 2020-10-04 (×4): 6.25 mg via ORAL
  Filled 2020-10-02 (×4): qty 2

## 2020-10-02 MED ORDER — ADULT MULTIVITAMIN W/MINERALS CH
1.0000 | ORAL_TABLET | Freq: Every day | ORAL | Status: DC
  Administered 2020-10-03 – 2020-10-07 (×3): 1 via ORAL
  Filled 2020-10-02 (×3): qty 1

## 2020-10-02 SURGICAL SUPPLY — 40 items
BAG COUNTER SPONGE EZ (MISCELLANEOUS) IMPLANT
BAG SPNG 4X4 CLR HAZ (MISCELLANEOUS)
BNDG CMPR 75X41 PLY HI ABS (GAUZE/BANDAGES/DRESSINGS) ×1
BNDG CMPR STD VLCR NS LF 5.8X4 (GAUZE/BANDAGES/DRESSINGS) ×1
BNDG COHESIVE 4X5 TAN STRL (GAUZE/BANDAGES/DRESSINGS) ×3 IMPLANT
BNDG ELASTIC 4X5.8 VLCR NS LF (GAUZE/BANDAGES/DRESSINGS) ×3 IMPLANT
BNDG ESMARK 4X12 TAN STRL LF (GAUZE/BANDAGES/DRESSINGS) ×3 IMPLANT
BNDG GAUZE 4.5X4.1 6PLY STRL (MISCELLANEOUS) ×3 IMPLANT
BNDG STRETCH 4X75 STRL LF (GAUZE/BANDAGES/DRESSINGS) ×3 IMPLANT
CANISTER SUCT 1200ML W/VALVE (MISCELLANEOUS) ×3 IMPLANT
COVER WAND RF STERILE (DRAPES) ×3 IMPLANT
CUFF TOURN SGL QUICK 12 (TOURNIQUET CUFF) ×3 IMPLANT
CUFF TOURN SGL QUICK 18X4 (TOURNIQUET CUFF) ×3 IMPLANT
DRAIN PENROSE 1/4X12 LTX STRL (WOUND CARE) IMPLANT
DURAPREP 26ML APPLICATOR (WOUND CARE) ×3 IMPLANT
ELECT REM PT RETURN 9FT ADLT (ELECTROSURGICAL) ×2
ELECTRODE REM PT RTRN 9FT ADLT (ELECTROSURGICAL) ×2 IMPLANT
GAUZE SPONGE 4X4 12PLY STRL (GAUZE/BANDAGES/DRESSINGS) ×6 IMPLANT
GAUZE XEROFORM 1X8 LF (GAUZE/BANDAGES/DRESSINGS) ×3 IMPLANT
GLOVE BIO SURGEON STRL SZ7.5 (GLOVE) ×3 IMPLANT
GLOVE INDICATOR 8.0 STRL GRN (GLOVE) ×3 IMPLANT
GOWN STRL REUS W/ TWL XL LVL3 (GOWN DISPOSABLE) ×4 IMPLANT
GOWN STRL REUS W/TWL XL LVL3 (GOWN DISPOSABLE) ×4
HANDLE YANKAUER SUCT BULB TIP (MISCELLANEOUS) IMPLANT
KIT TURNOVER KIT A (KITS) ×3 IMPLANT
LABEL OR SOLS (LABEL) IMPLANT
MANIFOLD NEPTUNE II (INSTRUMENTS) ×3 IMPLANT
NDL FILTER BLUNT 18X1 1/2 (NEEDLE) ×1 IMPLANT
NDL SAFETY ECLIPSE 18X1.5 (NEEDLE) ×2 IMPLANT
NEEDLE FILTER BLUNT 18X 1/2SAF (NEEDLE) ×1
NEEDLE FILTER BLUNT 18X1 1/2 (NEEDLE) ×1 IMPLANT
NEEDLE HYPO 18GX1.5 SHARP (NEEDLE) ×2
NS IRRIG 500ML POUR BTL (IV SOLUTION) ×3 IMPLANT
PACK EXTREMITY (MISCELLANEOUS) ×3 IMPLANT
PAD ABD DERMACEA PRESS 5X9 (GAUZE/BANDAGES/DRESSINGS) ×6 IMPLANT
SOL PREP PVP 2OZ (MISCELLANEOUS) ×2
SOLUTION PREP PVP 2OZ (MISCELLANEOUS) ×2 IMPLANT
SPONGE LAP 18X18 RF (DISPOSABLE) ×3 IMPLANT
STOCKINETTE M/LG 89821 (MISCELLANEOUS) ×3 IMPLANT
SYR 10ML LL (SYRINGE) ×6 IMPLANT

## 2020-10-02 NOTE — Progress Notes (Signed)
Initial Nutrition Assessment  DOCUMENTATION CODES:   Not applicable  INTERVENTION:  Provide Ensure Max Protein po once daily, each supplement provides 150 kcal and 30 grams of protein.  Provide MVI po daily.  RD provided "Carbohydrate Counting for People with Diabetes" handout from the Academy of [employer] and Dietetics. Discussed different food groups and their effects on blood sugar, emphasizing carbohydrate-containing foods. Provided list of carbohydrates and recommended serving sizes of common foods. Discussed importance of controlled and consistent carbohydrate intake throughout the day. Provided examples of ways to balance meals/snacks and encouraged intake of high-fiber, whole grain complex carbohydrates. Teach back method used.  NUTRITION DIAGNOSIS:   Increased nutrient needs related to wound healing as evidenced by estimated needs.  GOAL:   Patient will meet greater than or equal to 90% of their needs  MONITOR:   PO intake,Supplement acceptance,Labs,Weight trends,Skin,I & O's  REASON FOR ASSESSMENT:   Consult Wound healing  ASSESSMENT:   44 year old female with PMHx of DM, HTN, cocaine use, seizures, chronic combined systolic and diastolic CHF, COPD, GERD, bipolar disorder admitted with osteomyelitis of left fourth metatarsal.   Met with patient and her mother at bedside this morning. She reports her appetite and intake is good at baseline. She typically eats one large meal per day. She reports she has meat with sides. Discussed importance of adequate protein intake. Patient is amenable to drinking Ensure Max Protein to help meet protein needs. Also discussed importance of glycemic control. Provided DM education.   Patient reports she has had some weight loss over time but reports it was intentional. Per chart patient was 99.8 kg on 01/30/2020. She is currently documented to be 88.5 kg (195 lbs) but weight taken on 10/01/2020 was 95.8 kg (211.1 lbs), so unsure which one is  accurate. Suspect 195 lbs is reported and 211.1 lbs is true measured weight.  Plan was for amputation today but surgery has been cancelled as UDS positive for cocaine.  Medications reviewed and include: Lasix 40 mg daily, Novolog 0-15 units TID, Novolog 0-5 units QHS, Lantus 25 units BID, ceftriaxone, Flagyl, Remeron 15 mg QHS, nicotine patch.  Labs reviewed: CBG 102-113, Creatinine 1.35. HgbA1c 12.2.  NUTRITION - FOCUSED PHYSICAL EXAM:  Flowsheet Row Most Recent Value  Orbital Region No depletion  Upper Arm Region No depletion  Thoracic and Lumbar Region No depletion  Buccal Region No depletion  Temple Region No depletion  Clavicle Bone Region No depletion  Clavicle and Acromion Bone Region No depletion  Scapular Bone Region No depletion  Dorsal Hand No depletion  Patellar Region No depletion  Anterior Thigh Region No depletion  Posterior Calf Region Mild depletion  Edema (RD Assessment) Mild  Hair Reviewed  Eyes Reviewed  Mouth Reviewed  Skin Reviewed  Nails Reviewed     Diet Order:   Diet Order            Diet heart healthy/carb modified Room service appropriate? Yes; Fluid consistency: Thin  Diet effective now                EDUCATION NEEDS:   Education needs have been addressed  Skin:  Skin Assessment: Skin Integrity Issues: Skin Integrity Issues:: Diabetic Ulcer Diabetic Ulcer: left foot and left heel  Last BM:  Unknown  Height:   Ht Readings from Last 1 Encounters:  10/02/20 _0  (1.753 m)   Weight:   Wt Readings from Last 1 Encounters:  10/02/20 88.5 kg   BMI:  Body mass index  is 28.8 kg/m.  Estimated Nutritional Needs:   Kcal:  2000-2200  Protein:  100-110 grams  Fluid:  2 L/day  Jacklynn Barnacle, MS, RD, LDN Pager number available on Amion

## 2020-10-02 NOTE — Consult Note (Signed)
NAME: [name]  DOB: Dec 28, 1975  MRN: [id number]  Date/Time: 10/02/2020 12:50 PM  REQUESTING PROVIDER: wieting Subjective:  REASON FOR CONSULT: Right foot infection ? [name] is a 44 y.o. with a history of diabetes, multiple infection of the feet requiring amputation of toes cocaine use,  Presents with wound rt fifth toe a few weeks duration.  Patient is followed by podiatrist in their office and has had cultures which has been positive for staph aureus on 09/11/2020 as well as in July 13, 2020.  Patient has been given doxycycline recently.  On 09/26/2019 when patient had x-ray which revealed cortical erosion of the lateral base of the proximal phalanx of the fourth toe with some medial and dorsal subluxation of the toes in relation to the metatarsal head.  There was obvious osteomyelitis.  Patient was asked to get admitted for surgery.  She presented to the ED on 09/30/2020. She does not have any fever Surgery was planned today but it was postponed because the urine had cocaine.  Patient states she started doing cocaine since her grandfather passed away a month ago.  Patient has had multiple surgeries to both feet. 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 Bipolar disorder ([place])   . Chronic combined systolic and diastolic CHF (congestive heart failure) ([place])    a. ECHO 04/23/14 LV EF: 45% - 50%,  mild LVH, diffuse hypokinesis. LV diastolic dysfunction, high ventricular filling pressure. Mild LA dilation.  . Cocaine abuse ([name])   . COPD (chronic obstructive pulmonary disease) ([place])   . Diabetes mellitus without complication ([place])   . Dyspnea   . GERD (gastroesophageal reflux disease)   . Heroin overdose ([place]) 10/2018  . Hypertension   . MRSA infection    arm wound  . Nicotine  dependence with current use   . Seizures ([place])    last seizure March 2021; usual 1-2 per month    Past Surgical History:  Procedure Laterality Date  . ABDOMINAL HYSTERECTOMY    . ACHILLES TENDON SURGERY Left 03/31/2020   Procedure: ACHILLES LENGTHENING;  Surgeon: Caroline More, DPM;  Location: [facility];  Service: Podiatry;  Laterality: Left;  . AMPUTATION TOE Left 03/23/2019   Procedure: AMPUTATION TOE GREAT LEFT TOE;  Surgeon: [name] Patricia, DPM;  Location: [facility];  Service: Podiatry;  Laterality: Left;  . AMPUTATION TOE Left 08/16/2019   Procedure: AMPUTATION TOE 5th;  Surgeon: Caroline More, DPM;  Location: [facility];  Service: Podiatry;  Laterality: Left;  . AMPUTATION TOE Left 02/05/2020   Procedure: AMPUTATION MPJ LEFT 4TH;  Surgeon: Caroline More, DPM;  Location: [facility];  Service: Podiatry;  Laterality: Left;  . ANKLE FRACTURE SURGERY Left    pins/screws in place  . CHOLECYSTECTOMY  2005  . FRACTURE SURGERY    . LOWER EXTREMITY ANGIOGRAPHY Left 03/12/2019   Procedure: LOWER EXTREMITY ANGIOGRAPHY;  Surgeon: Katha Cabal, MD;  Location: [place] [facility];  Service: Cardiovascular;  Laterality: Left;  . TEE WITHOUT CARDIOVERSION N/A 03/26/2019   Procedure: TRANSESOPHAGEAL ECHOCARDIOGRAM (TEE);  Surgeon: Nelva Bush, MD;  Location: [facility];  Service: Cardiovascular;  Laterality: N/A;  . TRANSMETATARSAL AMPUTATION Left 03/31/2020   Procedure: TRANSMETATARSAL AMPUTATION;  Surgeon: Caroline More, DPM;  Location: [facility];  Service: Podiatry;  Laterality: Left;  . TUBAL LIGATION  Social History   Socioeconomic History  . Marital status: Single    Spouse name: Not on file  . Number of children: 0  . Years of education: 61  . Highest education level: Not on file  Occupational History  . Occupation: Unemployed  Tobacco Use  . Smoking status: Current Every Day Smoker    Packs/day: 1.00    Years: 28.00    Pack years: 28.00    Types: Cigarettes  . Smokeless  tobacco: Never Used  Vaping Use  . Vaping Use: Never used  Substance and Sexual Activity  . Alcohol use: Not Currently    Alcohol/week: 0.0 standard drinks    Comment: Quit drinking 11/16 per pt  . Drug use: Not Currently    Comment: last use 5 years ago 2016  . Sexual activity: Yes  Other Topics Concern  . Not on file  Social History Narrative   Lives at home with mother.    Caffeine use: Soda (drinks 3L per day)   Social Determinants of Health   Financial Resource Strain: Not on file  Food Insecurity: Not on file  Transportation Needs: Not on file  Physical Activity: Not on file  Stress: Not on file  Social Connections: Not on file  Intimate Partner Violence: Not on file    Family History  Problem Relation Age of Onset  . Hypertension Other   . Drug abuse Mother   . Seizures Neg Hx    Allergies  Allergen Reactions  . Albumin (Human)     unknown  . Erythromycin Rash  . Midecamycin Rash    ? Current Facility-Administered Medications  Medication Dose Route Frequency Provider Last Rate Last Admin  . [MAR Hold] 0.9 %  sodium chloride infusion   Intravenous PRN Loletha Grayer, MD 10 mL/hr at 10/02/20 0920 1,000 mL at 10/02/20 0920  . [MAR Hold] acetaminophen (TYLENOL) tablet 650 mg  650 mg Oral Q6H PRN Lenore Cordia, MD       Or  . Doug Sou Hold] acetaminophen (TYLENOL) suppository 650 mg  650 mg Rectal Q6H PRN Lenore Cordia, MD      . Doug Sou Hold] albuterol (VENTOLIN HFA) 108 (90 Base) MCG/ACT inhaler 1-2 puff  1-2 puff Inhalation Q4H PRN Lenore Cordia, MD      . Doug Sou Hold] amitriptyline (ELAVIL) tablet 150 mg  150 mg Oral QHS Zada Finders R, MD   150 mg at 10/01/20 2219  . [MAR Hold] atorvastatin (LIPITOR) tablet 20 mg  20 mg Oral q1800 Lenore Cordia, MD   20 mg at 10/01/20 1809  . [MAR Hold] bethanechol (URECHOLINE) tablet 25 mg  25 mg Oral BID Lenore Cordia, MD   25 mg at 10/01/20 2244  . [MAR Hold] cefTRIAXone (ROCEPHIN) 2 g in sodium chloride 0.9 % 100 mL  IVPB  2 g Intravenous Q24H Zada Finders R, MD 200 mL/hr at 10/02/20 0923 2 g at 10/02/20 6767   And  . [MAR Hold] metroNIDAZOLE (FLAGYL) tablet 500 mg  500 mg Oral Q8H Zada Finders R, MD   500 mg at 10/01/20 2219  . chlorhexidine (HIBICLENS) 4 % liquid 4 application  60 mL Topical Once Samara Deist, DPM      . Doug Sou Hold] furosemide (LASIX) tablet 40 mg  40 mg Oral Daily Zada Finders R, MD   40 mg at 10/01/20 1017  . [MAR Hold] gabapentin (NEURONTIN) tablet 600 mg  600 mg Oral TID Lenore Cordia, MD   600  mg at 10/01/20 2220  . [MAR Hold] heparin injection 5,000 Units  5,000 Units Subcutaneous Q8H Lenore Cordia, MD   5,000 Units at 10/01/20 2216  . [MAR Hold] hydrOXYzine (ATARAX/VISTARIL) tablet 50 mg  50 mg Oral TID PRN Lenore Cordia, MD   50 mg at 10/01/20 2217  . [MAR Hold] insulin aspart (novoLOG) injection 0-15 Units  0-15 Units Subcutaneous TID WC Lenore Cordia, MD   11 Units at 10/01/20 1424  . [MAR Hold] insulin aspart (novoLOG) injection 0-5 Units  0-5 Units Subcutaneous QHS Lenore Cordia, MD      . Doug Sou Hold] insulin glargine (LANTUS) injection 30 Units  30 Units Subcutaneous BID Loletha Grayer, MD      . Doug Sou Hold] metoprolol tartrate (LOPRESSOR) tablet 25 mg  25 mg Oral BID Lenore Cordia, MD   25 mg at 10/02/20 0910  . [MAR Hold] mirtazapine (REMERON) tablet 15 mg  15 mg Oral QHS Lenore Cordia, MD   15 mg at 10/01/20 2220  . [MAR Hold] nicotine (NICODERM CQ - dosed in mg/24 hours) patch 21 mg  21 mg Transdermal Daily Lenore Cordia, MD   21 mg at 10/02/20 0911  . [MAR Hold] ondansetron (ZOFRAN) tablet 4 mg  4 mg Oral Q6H PRN Lenore Cordia, MD       Or  . Doug Sou Hold] ondansetron (ZOFRAN) injection 4 mg  4 mg Intravenous Q6H PRN Lenore Cordia, MD      . Doug Sou Hold] pantoprazole (PROTONIX) EC tablet 20 mg  20 mg Oral Daily Zada Finders R, MD   20 mg at 10/01/20 1031  . povidone-iodine 10 % swab 2 application  2 application Topical Once Samara Deist, DPM      .  Doug Sou Hold] prazosin (MINIPRESS) capsule 5 mg  5 mg Oral QHS Lenore Cordia, MD   5 mg at 10/01/20 2244  . [MAR Hold] QUEtiapine (SEROQUEL) tablet 800 mg  800 mg Oral QHS Zada Finders R, MD   800 mg at 10/01/20 2216  . [MAR Hold] senna-docusate (Senokot-S) tablet 1 tablet  1 tablet Oral QHS PRN Lenore Cordia, MD      . Doug Sou Hold] sodium chloride flush (NS) 0.9 % injection 10-40 mL  10-40 mL Intracatheter Q12H Lenore Cordia, MD   10 mL at 10/02/20 0837  . [MAR Hold] sodium chloride flush (NS) 0.9 % injection 10-40 mL  10-40 mL Intracatheter PRN Lenore Cordia, MD      . Doug Sou Hold] tamsulosin (FLOMAX) capsule 0.4 mg  0.4 mg Oral QHS Zada Finders R, MD   0.4 mg at 10/01/20 2218  . [MAR Hold] venlafaxine XR (EFFEXOR-XR) 24 hr capsule 300 mg  300 mg Oral QHS Zada Finders R, MD   300 mg at 10/01/20 2243     Abtx:  Anti-infectives (From admission, onward)   Start     Dose/Rate Route Frequency Ordered Stop   10/01/20 0800  [MAR Hold]  cefTRIAXone (ROCEPHIN) 2 g in sodium chloride 0.9 % 100 mL IVPB        (MAR Hold since Fri 10/02/2020 at 1243.Hold Reason: Transfer to a Procedural area.)  "And" Linked Group Details   2 g 200 mL/hr over 30 Minutes Intravenous Every 24 hours 10/01/20 0043     10/01/20 0600  [MAR Hold]  metroNIDAZOLE (FLAGYL) tablet 500 mg        (MAR Hold since Fri 10/02/2020 at 1243.Hold Reason: Transfer to a  Procedural area.)  "And" Linked Group Details   500 mg Oral Every 8 hours 10/01/20 0043     09/30/20 2315  piperacillin-tazobactam (ZOSYN) IVPB 3.375 g  Status:  Discontinued        3.375 g 100 mL/hr over 30 Minutes Intravenous  Once 09/30/20 2308 10/01/20 0125      REVIEW OF SYSTEMS:  Const: negative fever, negative chills, negative weight loss Eyes: negative diplopia or visual changes, negative eye pain ENT: negative coryza, negative sore throat Resp: negative cough, hemoptysis, dyspnea Cards: negative for chest pain, palpitations, lower extremity edema GU:  negative for frequency, dysuria and hematuria GI: Negative for abdominal pain, diarrhea, bleeding, constipation Skin: negative for rash and pruritus Heme: negative for easy bruising and gum/nose bleeding MS: negative for myalgias, arthralgias, back pain and muscle weakness Neurolo:negative for headaches, dizziness, vertigo, memory problems  Psych: negative for feelings of anxiety, depression  Endocrine: Diabetes mellitus states is better controlled now. Allergy/Immunology-as above: Objective:  VITALS:  BP 100/68 (BP Location: Right Arm)   Pulse 77   Temp 98.1 F (36.7 C)   Resp 17   Ht 5\' 9"  (1.753 m)   Wt 95.8 kg   LMP  (LMP Unknown)   SpO2 100%   BMI 31.17 kg/m  PHYSICAL EXAM:  General: Alert, cooperative, no distress, appears stated age.  Head: Normocephalic, without obvious abnormality, atraumatic. Eyes: Conjunctivae clear, anicteric sclerae. Pupils are equal ENT Nares normal. No drainage or sinus tenderness. Lips, mucosa, and tongue normal. No Thrush, edentulous Involuntary movement of her mouth Neck: Supple, symmetrical, no adenopathy, thyroid: non tender no carotid bruit and no JVD. Back: No CVA tenderness. Lungs: Clear to auscultation bilaterally. No Wheezing or Rhonchi. No rales. Heart: Regular rate and rhythm, no murmur, rub or gallop. Abdomen: Soft, non-tender,not distended. Bowel sounds normal. No masses Extremities: Left foot TMA There is a midfoot ulceration Right foot there is an ulcer between the fourth and fifth space       skin: No rashes or lesions. Or bruising Lymph: Cervical, supraclavicular normal. Neurologic: Grossly non-focal Pertinent Labs Lab Results CBC    Component Value Date/Time   WBC 8.6 10/02/2020 0457   RBC 3.81 (L) 10/02/2020 0457   HGB 10.7 (L) 10/02/2020 0457   HCT 32.7 (L) 10/02/2020 0457   PLT 359 10/02/2020 0457   MCV 85.8 10/02/2020 0457   MCH 28.1 10/02/2020 0457   MCHC 32.7 10/02/2020 0457   RDW 13.2 10/02/2020 0457    LYMPHSABS 1.0 09/30/2020 1534   MONOABS 0.3 09/30/2020 1534   EOSABS 0.1 09/30/2020 1534   BASOSABS 0.1 09/30/2020 1534    CMP Latest Ref Rng & Units 10/02/2020 09/30/2020 04/02/2020  Glucose 70 - 99 mg/dL 97 482(H) -  BUN 6 - 20 mg/dL 19 16 -  Creatinine 0.44 - 1.00 mg/dL 1.35(H) 1.61(H) 1.41(H)  Sodium 135 - 145 mmol/L 137 131(L) -  Potassium 3.5 - 5.1 mmol/L 3.7 3.5 -  Chloride 98 - 111 mmol/L 102 93(L) -  CO2 22 - 32 mmol/L 26 27 -  Calcium 8.9 - 10.3 mg/dL 8.8(L) 8.8(L) -  Total Protein 6.5 - 8.1 g/dL - 8.0 -  Total Bilirubin 0.3 - 1.2 mg/dL - 0.5 -  Alkaline Phos 38 - 126 U/L - 112 -  AST 15 - 41 U/L - 26 -  ALT 0 - 44 U/L - 19 -      Microbiology: 09/11/2020 culture from the right foot is MSSA from [facility]  IMAGING RESULTS:  MRI of the right foot shows area of ulceration on the dorsum of the foot about the fourth metatarsal head with findings consistent with acute osteomyelitis involving the fourth metatarsal head and proximal phalanx.  No subcutaneous abscess.  Probable reactive marrow of the fifth metatarsal head and proximal phalanx. I have personally reviewed the films ? Impression/Recommendation ? ?Diabetic foot infection.  Right foot ulcers.  Ulcer at the fourth and fifth toe base.  Osteomyelitis seen on MRI on the fourth. Recently had MSSA in the wound hence  will cover her with cefazolin and Flagyl.  DC ceftriaxone No evidence of systemic infection.  Diabetes on insulin.  __CKD ___Anemia of chronic disease  Cocaine use  Hypertension on carvedilol prazosin and furosemide  _Bipolar  disorder.  On Seroquel, venlafaxine, mirtazapine and Elavil.  __________________________________________ Discussed with patient, requesting provider ID will follow peripherally this weekend.  Call if needed. Note:  This document was prepared using Dragon voice recognition software and may include unintentional dictation errors.

## 2020-10-02 NOTE — Progress Notes (Signed)
UDS + for cocaine.  Will delay surgery for now.  Will c/w IV abx until UDS clears.  Pt state smoke joint about a week ago and assumes cocaine was in joint.  D/W Medicine and will plan for daily UDS till clears.

## 2020-10-02 NOTE — Progress Notes (Addendum)
Patient ID: [name], female   DOB: Sep 13, 1976, 44 y.o.   MRN: [id number] [facility] PROGRESS NOTE  MARYE EAGEN UMP:[id number] DOB: 01/29/1976 DOA: 09/30/2020 PCP: [facility]  HPI/Subjective: Patient seen this morning prior to operating room.  Not having that much right foot pain.  Some drainage from the right foot.  Has a chronic ulcer on her left foot.  Noted with osteomyelitis.  Objective: Vitals:   10/02/20 1201 10/02/20 1259  BP: 100/68 (!) 125/99  Pulse: 77 87  Resp: 17 17  Temp: 98.1 F (36.7 C) 98.1 F (36.7 C)  SpO2: 100% 100%    Intake/Output Summary (Last 24 hours) at 10/02/2020 1344 Last data filed at 10/02/2020 0400 Gross per 24 hour  Intake 237 ml  Output -  Net 237 ml   Filed Weights   09/30/20 1531 10/01/20 0500 10/02/20 1259  Weight: 88.5 kg 95.8 kg 88.5 kg    ROS: Review of Systems  Respiratory: Negative for cough and shortness of breath.   Cardiovascular: Negative for chest pain.  Gastrointestinal: Negative for abdominal pain, nausea and vomiting.  Musculoskeletal: Positive for joint pain.   Exam: Physical Exam HENT:     Head: Normocephalic.     Mouth/Throat:     Pharynx: No oropharyngeal exudate.  Eyes:     General: Lids are normal.     Conjunctiva/sclera: Conjunctivae normal.     Pupils: Pupils are equal, round, and reactive to light.  Cardiovascular:     Rate and Rhythm: Normal rate and regular rhythm.     Heart sounds: Normal heart sounds, S1 normal and S2 normal.  Pulmonary:     Breath sounds: No decreased breath sounds, wheezing, rhonchi or rales.  Abdominal:     Palpations: Abdomen is soft.     Tenderness: There is no abdominal tenderness.  Musculoskeletal:     Right ankle: No swelling.     Left ankle: No swelling.  Skin:    General: Skin is warm.     Comments: Right foot between fourth and fifth toe there is some drainage and an ulcer underneath the fourth toe. Left foot does have a small ulceration  and prior transmit amputation  Neurological:     Mental Status: She is alert and oriented to person, place, and time.       Data Reviewed: Basic Metabolic Panel: Recent Labs  Lab 09/30/20 1615 10/02/20 0457  NA 131* 137  K 3.5 3.7  CL 93* 102  CO2 27 26  GLUCOSE 482* 97  BUN 16 19  CREATININE 1.61* 1.35*  CALCIUM 8.8* 8.8*   Liver Function Tests: Recent Labs  Lab 09/30/20 1615  AST 26  ALT 19  ALKPHOS 112  BILITOT 0.5  PROT 8.0  ALBUMIN 2.9*   CBC: Recent Labs  Lab 09/30/20 1534 10/01/20 1910 10/02/20 0457  WBC 10.3 9.9 8.6  NEUTROABS 8.8*  --   --   HGB 14.5 12.3 10.7*  HCT 43.0 36.9 32.7*  MCV 84.0 85.2 85.8  PLT 372 417* 359    CBG: Recent Labs  Lab 10/01/20 1646 10/01/20 2103 10/02/20 0753 10/02/20 1159 10/02/20 1256  GLUCAP 224* 102* 102* 110* 113*     Studies: DG Ankle Complete Right  Result Date: 10/01/2020 CLINICAL DATA:  Ulcer between the fourth and fifth toes. EXAM: RIGHT ANKLE - COMPLETE 3+ VIEW COMPARISON:  Foot radiographs from yesterday and MRI from today. CT ankle 07/20/2020 FINDINGS: Remote healing distal fibula medial malleolar fractures. The  ankle mortise is maintained. Mild ankle joint degenerative changes and remote healed osteochondral lesion or bone infarct. No definite ankle joint effusion. The mid and hindfoot bony structures are intact. No destructive changes. There is a pathologic fracture/dislocation involving the fourth toe. IMPRESSION: 1. Healing distal fibula and medial malleolar fractures. 2. Pathologic fracture/dislocation at the fourth MTP joint. Electronically Signed   By: Marijo Sanes M.D.   On: 10/01/2020 08:36   MR FOOT RIGHT WO CONTRAST  Result Date: 10/01/2020 CLINICAL DATA:  Worsening ulceration of the fifth digit EXAM: MRI OF THE RIGHT FOREFOOT WITHOUT CONTRAST TECHNIQUE: Multiplanar, multisequence MR imaging of the right was performed. No intravenous contrast was administered. COMPARISON:  Radiograph same  day FINDINGS: Bones/Joint/Cartilage There is cortical destruction with surrounding periosteal reaction with T2 bright/T1 hypointense signal within the fourth metatarsal head. There is also a T2 hyperintense signal with T1 hypointensity and irregularity at the MTP joint surrounding the proximal fourth phalanx. T2 hyperintense signal seen throughout the fifth proximal phalanx and fifth metatarsal head without T1 hypointensity. Ligaments The Lisfranc ligaments are intact. Muscles and Tendons Diffusely increased signal with atrophy seen within the musculature. The flexor and extensor tendons are grossly intact. Soft tissues There is a area of ulceration seen along the dorsum of the foot between the fourth and fifth metatarsal heads. There is surrounding skin thickening and diffuse subcutaneous edema. No loculated fluid collections however are noted. IMPRESSION: Area of ulceration on the dorsum of the foot above the fourth metatarsal head with findings consistent with acute osteomyelitis involving the fourth metatarsal head and proximal phalanx. No subcutaneous abscess. Probable reactive marrow of the fifth metatarsal head and proximal phalanx. Electronically Signed   By: Prudencio Pair M.D.   On: 10/01/2020 03:08   US ARTERIAL ABI (SCREENING LOWER EXTREMITY)  Result Date: 10/02/2020 CLINICAL DATA:  Scheduled for right toe amputations. History of left toe amputations. EXAM: NONINVASIVE PHYSIOLOGIC VASCULAR STUDY OF BILATERAL LOWER EXTREMITIES TECHNIQUE: Evaluation of both lower extremities were performed at rest, including calculation of ankle-brachial indices with single level Doppler, pressure and pulse volume recording. COMPARISON:  None. FINDINGS: Right ABI:  1.26 Left ABI:  1.11 Right Lower Extremity:  Normal arterial waveforms at the ankle. Left Lower Extremity:  Normal arterial waveforms at the ankle. 1.0-1.4 Normal IMPRESSION: Normal ankle-brachial indices bilaterally. No evidence for occlusive arterial  disease in lower extremities. Electronically Signed   By: Markus Daft M.D.   On: 10/02/2020 08:01   DG Foot Complete Right  Result Date: 09/30/2020 CLINICAL DATA:  Right fifth toe infection, evaluate for osteomyelitis. History of diabetes. Foot ulcer the twin fourth and fifth toes. EXAM: RIGHT FOOT COMPLETE - 3+ VIEW COMPARISON:  X-ray right foot 03/19/2020. FINDINGS: Lucency and cortical destruction of the base and proximal body of the fourth proximal phalanx. Lucency and cortical destruction of the head of the fourth metatarsal. Associated subluxation at the fourth metatarsophalangeal joint. Subcutaneus soft tissue edema. Known ulceration not visualized on this study. Remainder of the bones of the right foot demonstrate no acute displaced fracture or dislocation. No other regions of cortical erosion or destruction. Similar-appearing cortical irregularity along the neck lateral aspect of the fifth metatarsal. Chronic medial malleolar fracture. IMPRESSION: Osteomyelitis of the right fourth digit metatarsal and proximal phalanx. Associated subluxation of the fourth metatarsophalangeal joint. Electronically Signed   By: Iven Finn M.D.   On: 09/30/2020 23:23    Scheduled Meds: . [MAR Hold] amitriptyline  150 mg Oral QHS  . [MAR Hold] atorvastatin  20 mg Oral q1800  . [MAR Hold] bethanechol  25 mg Oral BID  . chlorhexidine  60 mL Topical Once  . [MAR Hold] furosemide  40 mg Oral Daily  . [MAR Hold] gabapentin  600 mg Oral TID  . [MAR Hold] heparin  5,000 Units Subcutaneous Q8H  . [MAR Hold] insulin aspart  0-15 Units Subcutaneous TID WC  . [MAR Hold] insulin aspart  0-5 Units Subcutaneous QHS  . insulin glargine  25 Units Subcutaneous BID  . [MAR Hold] metoprolol tartrate  25 mg Oral BID  . [MAR Hold] metroNIDAZOLE  500 mg Oral Q8H  . [MAR Hold] mirtazapine  15 mg Oral QHS  . [MAR Hold] nicotine  21 mg Transdermal Daily  . [MAR Hold] pantoprazole  20 mg Oral Daily  . povidone-iodine  2  application Topical Once  . [MAR Hold] prazosin  5 mg Oral QHS  . [MAR Hold] QUEtiapine  800 mg Oral QHS  . [MAR Hold] sodium chloride flush  10-40 mL Intracatheter Q12H  . [MAR Hold] tamsulosin  0.4 mg Oral QHS  . [MAR Hold] venlafaxine XR  300 mg Oral QHS   Continuous Infusions: . [MAR Hold] sodium chloride 1,000 mL (10/02/20 0920)  . [MAR Hold] cefTRIAXone (ROCEPHIN)  IV 2 g (10/02/20 9937)    Assessment/Plan:  1. Osteomyelitis of the right fourth metatarsal.  Follow-up cultures.  Patient to have amputation today by Dr. Vickki Muff.  Continue antibiotics.  I consulted infectious disease to decide on IV versus oral antibiotics upon disposition.  Patient had a normal ankle-brachial indices bilaterally. 2. 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  I was called yesterday evening with a sugar in the 400s and did give short acting insulin and increased her insulin up to her normal dose.  Sugars on the lower side this morning so I held long-acting insulin this morning.  We will cut back Lantus insulin 25 units twice a day starting this evening. 3. Chronic kidney disease stage IIIa.  Creatinine improved from 1.61 down to 1.35 this morning. 4. Hyperlipidemia unspecified on atorvastatin 5. Essential hypertension on metoprolol 6. Bipolar disorder.  Continue psychiatric medications 7. Chronic systolic congestive heart failure.  Patient on oral Lasix and metoprolol.  No signs of congestive heart failure today. 8. Stage II left heel pressure ulcer, present on admission      Code Status:     Code Status Orders  (From admission, onward)         Start     Ordered   10/01/20 0040  Full code  Continuous        10/01/20 0040        Code Status History    Date Active Date Inactive Code Status Order ID Comments User Context   03/30/2020 2051 04/02/2020 2119 Full Code [id number]  Harvie Bridge, DO Inpatient   08/15/2019 1941 08/18/2019 1431 Full Code [id number]  Lang Snow, NP Inpatient   03/21/2019 2224 03/28/2019 1915 Full Code [id number]  Lance Coon, MD Inpatient   03/12/2019 1026 03/12/2019 1623 Full Code [id number]  Katha Cabal, MD Inpatient   04/28/2014 1601 05/14/2014 1403 Full Code [id number]  Flora Lipps Inpatient   04/20/2014 1544 04/28/2014 1601 Full Code [id number]  Donita Brooks, NP ED   Advance Care Planning Activity      Disposition Plan: Status is: Inpatient  Dispo: The patient is from: Home  Anticipated d/c is to: Home              Anticipated d/c date is: Likely will be in the hospital over the weekend for IV antibiotics.              Patient currently in the operating room for amputation today.  Consultants:  Podiatry  Infectious disease  Antibiotics:  Rocephin  Flagyl  Time spent: 28 minutes  [place]

## 2020-10-02 NOTE — Progress Notes (Signed)
Inpatient Diabetes Program Recommendations  AACE/ADA: New Consensus Statement on Inpatient Glycemic Control (2015)  Target Ranges:  Prepandial:   less than 140 mg/dL      Peak postprandial:   less than 180 mg/dL (1-2 hours)      Critically ill patients:  140 - 180 mg/dL   Lab Results  Component Value Date   GLUCAP 102 (H) 10/02/2020   HGBA1C 12.2 (H) 10/01/2020    Review of Glycemic Control Results for TARONDA, COMACHO (MRN [id number]) as of 10/02/2020 11:33  Ref. Range 09/30/2020 21:34 10/01/2020 14:13 10/01/2020 16:46 10/01/2020 21:03 10/02/2020 07:53  Glucose-Capillary Latest Ref Range: 70 - 99 mg/dL 313 (H) 317 (H) 224 (H) 102 (H) 102 (H)   Diabetes history: DM 2 Outpatient Diabetes medications:  Lantus 30 units bid, Metformin 1000 mg bid, Novolog as needed Current orders for Inpatient glycemic control:  Novolog moderate tid with meals and HS Lantus 30 units bid  Inpatient Diabetes Program Recommendations:    Consider slight reduction in Lantus to 25 units bid.  Blood sugars improved.  Patient currently in surgery.  Will need f/u on A1C and with PCP when appropriate.   Thanks,  Adah Perl, RN, BC-ADM Inpatient Diabetes Coordinator Pager [phone] (8a-5p)

## 2020-10-02 NOTE — Progress Notes (Signed)
Patient ID: [name], female   DOB: 01-04-1976, 44 y.o.   MRN: [id number]  Urine drug screen positive for cocaine.  Surgery canceled for today Restart diet Send urine toxicology on a daily basis until cocaine out of the urine.  Dr Loletha Grayer

## 2020-10-02 NOTE — Anesthesia Preprocedure Evaluation (Addendum)
Anesthesia Evaluation  Patient identified by MRN, date of birth, ID band Patient awake    Reviewed: Allergy & Precautions, H&P , NPO status , Patient's Chart, lab work & pertinent test results  Airway Mallampati: II  TM Distance: <3 FB     Dental  (+) Edentulous Upper, Edentulous Lower   Pulmonary shortness of breath and with exertion, COPD, Current Smoker,     + decreased breath sounds      Cardiovascular hypertension, + Peripheral Vascular Disease and +CHF   Rhythm:regular Rate:Normal     Neuro/Psych Seizures -,  PSYCHIATRIC DISORDERS Anxiety Bipolar Disorder    GI/Hepatic Neg liver ROS, GERD  ,  Endo/Other  diabetes  Renal/GU Renal InsufficiencyRenal disease     Musculoskeletal  (+) Arthritis , Osteoarthritis,    Abdominal   Peds  Hematology negative hematology ROS (+)   Anesthesia Other Findings Past Medical History: No date: Bipolar disorder ([place]) No date: Chronic combined systolic and diastolic CHF (congestive  heart failure) ([place])     Comment:  a. ECHO 04/23/14 LV EF: 45% - 50%,  mild LVH, diffuse               hypokinesis. LV diastolic dysfunction, high ventricular               filling pressure. Mild LA dilation. No date: Cocaine abuse ([facility]) No date: COPD (chronic obstructive pulmonary disease) ([facility]) No date: Diabetes mellitus without complication (H[facility]) No date: GERD (gastroesophageal reflux disease) 10/2018: Heroin overdose ([place]) No date: Hypertension No date: MRSA infection     Comment:  arm wound No date: Nicotine dependence with current use No date: Seizures [facility])     Comment:  last seizure March 2021; usual 1-2 per month  Past Surgical History: No date: ABDOMINAL HYSTERECTOMY 03/23/2019: AMPUTATION TOE; Left     Comment:  Procedure: AMPUTATION TOE GREAT LEFT TOE;  Surgeon:               [name] Patricia, DPM;  Location: [facility] ORS;  Service:               Podiatry;  Laterality:  Left; 08/16/2019: AMPUTATION TOE; Left     Comment:  Procedure: AMPUTATION TOE 5th;  Surgeon: Caroline More,               DPM;  Location: [facility] ORS;  Service: Podiatry;                Laterality: Left; 02/05/2020: AMPUTATION TOE; Left     Comment:  Procedure: AMPUTATION MPJ LEFT 4TH;  Surgeon: Caroline More, DPM;  Location: [facility] ORS;  Service: Podiatry;                Laterality: Left; No date: ANKLE FRACTURE SURGERY; Left     Comment:  pins/screws in place 2005: CHOLECYSTECTOMY No date: FRACTURE SURGERY 03/12/2019: LOWER EXTREMITY ANGIOGRAPHY; Left     Comment:  Procedure: LOWER EXTREMITY ANGIOGRAPHY;  Surgeon:               Katha Cabal, MD;  Location: [place] CV LAB;               Service: Cardiovascular;  Laterality: Left; 03/26/2019: TEE WITHOUT CARDIOVERSION; N/A     Comment:  Procedure: TRANSESOPHAGEAL ECHOCARDIOGRAM (TEE);                Surgeon: Nelva Bush, MD;  Location: [facility]  ORS;                Service: Cardiovascular;  Laterality: N/A; No date: TUBAL LIGATION  BMI    Body Mass Index: 28.14 kg/m      Reproductive/Obstetrics negative OB ROS                             Anesthesia Physical  Anesthesia Plan  ASA: III  Anesthesia Plan: General   Post-op Pain Management:    Induction:   PONV Risk Score and Plan: Dexamethasone, Ondansetron and Treatment may vary due to age or medical condition  Airway Management Planned: LMA and Oral ETT  Additional Equipment:   Intra-op Plan:   Post-operative Plan: Extubation in OR  Informed Consent: I have reviewed the patients History and Physical, chart, labs and discussed the procedure including the risks, benefits and alternatives for the proposed anesthesia with the patient or authorized representative who has indicated his/her understanding and acceptance.     Dental Advisory Given  Plan Discussed with: Anesthesiologist, CRNA and Surgeon  Anesthesia Plan  Comments:         Anesthesia Quick Evaluation

## 2020-10-02 NOTE — Plan of Care (Signed)
  Problem: Education: Goal: Knowledge of General Education information will improve Description Including pain rating scale, medication(s)/side effects and non-pharmacologic comfort measures Outcome: Progressing   Problem: Clinical Measurements: Goal: Ability to maintain clinical measurements within normal limits will improve Outcome: Progressing Goal: Will remain free from infection Outcome: Progressing Goal: Diagnostic test results will improve Outcome: Progressing Goal: Respiratory complications will improve Outcome: Progressing Goal: Cardiovascular complication will be avoided Outcome: Progressing   

## 2020-10-03 DIAGNOSIS — I10 Essential (primary) hypertension: Secondary | ICD-10-CM

## 2020-10-03 DIAGNOSIS — E13621 Other specified diabetes mellitus with foot ulcer: Secondary | ICD-10-CM

## 2020-10-03 DIAGNOSIS — E1142 Type 2 diabetes mellitus with diabetic polyneuropathy: Secondary | ICD-10-CM

## 2020-10-03 DIAGNOSIS — N189 Chronic kidney disease, unspecified: Secondary | ICD-10-CM

## 2020-10-03 DIAGNOSIS — E119 Type 2 diabetes mellitus without complications: Secondary | ICD-10-CM

## 2020-10-03 DIAGNOSIS — L97518 Non-pressure chronic ulcer of other part of right foot with other specified severity: Secondary | ICD-10-CM

## 2020-10-03 LAB — CBC
HCT: 34.8 % — ABNORMAL LOW (ref 36.0–46.0)
Hemoglobin: 11.4 g/dL — ABNORMAL LOW (ref 12.0–15.0)
MCH: 28.4 pg (ref 26.0–34.0)
MCHC: 32.8 g/dL (ref 30.0–36.0)
MCV: 86.8 fL (ref 80.0–100.0)
Platelets: 372 10*3/uL (ref 150–400)
RBC: 4.01 MIL/uL (ref 3.87–5.11)
RDW: 13.4 % (ref 11.5–15.5)
WBC: 8.2 10*3/uL (ref 4.0–10.5)
nRBC: 0 % (ref 0.0–0.2)

## 2020-10-03 LAB — URINE DRUG SCREEN, QUALITATIVE (ARMC ONLY)
Amphetamines, Ur Screen: NOT DETECTED
Barbiturates, Ur Screen: NOT DETECTED
Benzodiazepine, Ur Scrn: NOT DETECTED
Cannabinoid 50 Ng, Ur ~~LOC~~: POSITIVE — AB
Cocaine Metabolite,Ur ~~LOC~~: NOT DETECTED
MDMA (Ecstasy)Ur Screen: NOT DETECTED
Methadone Scn, Ur: NOT DETECTED
Opiate, Ur Screen: NOT DETECTED
Phencyclidine (PCP) Ur S: NOT DETECTED
Tricyclic, Ur Screen: POSITIVE — AB

## 2020-10-03 LAB — BASIC METABOLIC PANEL
Anion gap: 10 (ref 5–15)
BUN: 23 mg/dL — ABNORMAL HIGH (ref 6–20)
CO2: 26 mmol/L (ref 22–32)
Calcium: 9.1 mg/dL (ref 8.9–10.3)
Chloride: 98 mmol/L (ref 98–111)
Creatinine, Ser: 1.41 mg/dL — ABNORMAL HIGH (ref 0.44–1.00)
GFR, Estimated: 47 mL/min — ABNORMAL LOW (ref >60)
Glucose, Bld: 231 mg/dL — ABNORMAL HIGH (ref 70–99)
Potassium: 3.7 mmol/L (ref 3.5–5.1)
Sodium: 134 mmol/L — ABNORMAL LOW (ref 135–145)

## 2020-10-03 LAB — FERRITIN: Ferritin: 95 ng/mL (ref 11–307)

## 2020-10-03 LAB — VITAMIN B12: Vitamin B-12: 222 pg/mL (ref 180–914)

## 2020-10-03 LAB — LACTATE DEHYDROGENASE: LDH: 146 U/L (ref 98–192)

## 2020-10-03 LAB — GLUCOSE, CAPILLARY
Glucose-Capillary: 260 mg/dL — ABNORMAL HIGH (ref 70–99)
Glucose-Capillary: 144 mg/dL — ABNORMAL HIGH (ref 70–99)
Glucose-Capillary: 180 mg/dL — ABNORMAL HIGH (ref 70–99)
Glucose-Capillary: 265 mg/dL — ABNORMAL HIGH (ref 70–99)

## 2020-10-03 NOTE — Progress Notes (Signed)
ID Pt doing okay Awaiting surgery No fever No sob  Baseline cough  O/e awake and alert Patient Vitals for the past 24 hrs:  BP Temp Temp src Pulse Resp SpO2  10/03/20 1124 95/71 98.3 F (36.8 C) Oral 94 18 98 %  10/03/20 0812 101/68 98.4 F (36.9 C) Oral 96 17 99 %  10/03/20 0455 100/74 (!) 97.5 F (36.4 C) Oral 89 17 96 %  10/03/20 0044 96/74 98.1 F (36.7 C) Oral 89 16 100 %  10/02/20 2026 101/80 98 F (36.7 C) Oral 91 17 100 %  10/02/20 1608 103/72 98.8 F (37.1 C) -- 90 16 100 %    Chest b/l air entry [id number] abd sioft B/L leg dressing not removed But seen yesterday       Labs CBC Latest Ref Rng & Units 10/03/2020 10/02/2020 10/01/2020  WBC 4.0 - 10.5 K/uL 8.2 8.6 9.9  Hemoglobin 12.0 - 15.0 g/dL 11.4(L) 10.7(L) 12.3  Hematocrit 36.0 - 46.0 % 34.8(L) 32.7(L) 36.9  Platelets 150 - 400 K/uL 372 359 417(H)    CMP Latest Ref Rng & Units 10/03/2020 10/02/2020 09/30/2020  Glucose 70 - 99 mg/dL 231(H) 97 482(H)  BUN 6 - 20 mg/dL 23(H) 19 16  Creatinine 0.44 - 1.00 mg/dL 1.41(H) 1.35(H) 1.61(H)  Sodium 135 - 145 mmol/L 134(L) 137 131(L)  Potassium 3.5 - 5.1 mmol/L 3.7 3.7 3.5  Chloride 98 - 111 mmol/L 98 102 93(L)  CO2 22 - 32 mmol/L 26 26 27   Calcium 8.9 - 10.3 mg/dL 9.1 8.8(L) 8.8(L)  Total Protein 6.5 - 8.1 g/dL - - 8.0  Total Bilirubin 0.3 - 1.2 mg/dL - - 0.5  Alkaline Phos 38 - 126 U/L - - 112  AST 15 - 41 U/L - - 26  ALT 0 - 44 U/L - - 19    Impression/recommendation  Diabetic foot infection rt  With ulcer at the fourth and 5th toe base. Osteomyelitis of the fourth toe- Awaiting surgery- was urine tox positive yesterday and surgery was cancelled- Today she is neg- so surgery will be planned for tomorrow MSSA has been cultured twice for the rt foot wound- pt currently on cefazolina nd flagyl- Will DC flagyl  H/O left  All toes amputated Has a mid foot ulcer which is trophic and superficial  Dm on insulin  CKD  Anemia of chronic disease  Bipolar  disorder  Has dystonia  HTN on carvedilol, prazosin frusemide  H/o diabetes induced bladder issues- is on tamsolusin and urecholine Discussed the management with patient and her mom at bed side

## 2020-10-03 NOTE — TOC Progression Note (Signed)
Transition of Care [facility]) - Progression Note    Patient Details  Name: [name] MRN: [id number] Date of Birth: 1976/05/08  [facility]) CM/SW Contact  Izola Price, RN Phone Number: 10/03/2020, 1:44 PM  Clinical Narrative:   12/11 at 145 PM  OR planned for 12/12 for toe amputation. Osteomyelitis and on IV ABx. Earliest DC per provider is 12/14. Will monitor for discharge planning needs after surgery. Simmie Davies RN CM      Expected Discharge Plan: [place] Barriers to Discharge: Continued Medical Work up  Expected Discharge Plan and Services Expected Discharge Plan: [place] In-house Referral: Clinical Social Work   Post Acute Care Choice: [place] arrangements for the past 2 months: Single Family Home                                       Social Determinants of Health (SDOH) Interventions    Readmission Risk Interventions No flowsheet data found.

## 2020-10-03 NOTE — Progress Notes (Signed)
Patient ID: [name], female   DOB: Jul 26, 1976, 44 y.o.   MRN: [id number] [facility] PROGRESS NOTE  [name] ZTI:[id number] DOB: 08/25/1976 DOA: 09/30/2020 PCP: [facility]  HPI/Subjective: Patient feels okay.  No complaints of chest pain or shortness of breath.  Some discomfort in her foot but since she has diabetic neuropathy not too much.  Admitted with osteomyelitis.  Objective: Vitals:   10/03/20 0812 10/03/20 1124  BP: 101/68 95/71  Pulse: 96 94  Resp: 17 18  Temp: 98.4 F (36.9 C) 98.3 F (36.8 C)  SpO2: 99% 98%    Intake/Output Summary (Last 24 hours) at 10/03/2020 1154 Last data filed at 10/03/2020 8250 Gross per 24 hour  Intake 237.2 ml  Output 900 ml  Net -662.8 ml   Filed Weights   09/30/20 1531 10/01/20 0500 10/02/20 1259  Weight: 88.5 kg 95.8 kg 88.5 kg    ROS: Review of Systems  Respiratory: Negative for cough and shortness of breath.   Cardiovascular: Negative for chest pain.  Gastrointestinal: Negative for abdominal pain, nausea and vomiting.  Musculoskeletal: Positive for joint pain.   Exam: Physical Exam HENT:     Head: Normocephalic.     Mouth/Throat:     Pharynx: No oropharyngeal exudate.  Eyes:     General: Lids are normal.     Conjunctiva/sclera: Conjunctivae normal.     Pupils: Pupils are equal, round, and reactive to light.  Cardiovascular:     Rate and Rhythm: Normal rate and regular rhythm.     Heart sounds: Normal heart sounds, S1 normal and S2 normal.  Pulmonary:     Breath sounds: No decreased breath sounds, wheezing, rhonchi or rales.  Abdominal:     Palpations: Abdomen is soft.     Tenderness: There is no abdominal tenderness.  Musculoskeletal:     Right lower leg: No swelling.     Left lower leg: No swelling.  Skin:    General: Skin is warm.     Findings: No rash.     Comments: Bilateral feet covered.  Neurological:     Mental Status: She is alert and oriented to person, place, and time.        Data Reviewed: Basic Metabolic Panel: Recent Labs  Lab 09/30/20 1615 10/02/20 0457 10/03/20 0411  NA 131* 137 134*  K 3.5 3.7 3.7  CL 93* 102 98  CO2 27 26 26   GLUCOSE 482* 97 231*  BUN 16 19 23*  CREATININE 1.61* 1.35* 1.41*  CALCIUM 8.8* 8.8* 9.1   Liver Function Tests: Recent Labs  Lab 09/30/20 1615  AST 26  ALT 19  ALKPHOS 112  BILITOT 0.5  PROT 8.0  ALBUMIN 2.9*   CBC: Recent Labs  Lab 09/30/20 1534 10/01/20 1910 10/02/20 0457 10/03/20 0411  WBC 10.3 9.9 8.6 8.2  NEUTROABS 8.8*  --   --   --   HGB 14.5 12.3 10.7* 11.4*  HCT 43.0 36.9 32.7* 34.8*  MCV 84.0 85.2 85.8 86.8  PLT 372 417* 359 372    CBG: Recent Labs  Lab 10/02/20 1159 10/02/20 1256 10/02/20 1638 10/02/20 2038 10/03/20 0810  GLUCAP 110* 113* 226* 341* 260*     Studies: US ARTERIAL ABI (SCREENING LOWER EXTREMITY)  Result Date: 10/02/2020 CLINICAL DATA:  Scheduled for right toe amputations. History of left toe amputations. EXAM: NONINVASIVE PHYSIOLOGIC VASCULAR STUDY OF BILATERAL LOWER EXTREMITIES TECHNIQUE: Evaluation of both lower extremities were performed at rest, including calculation of ankle-brachial indices with  single level Doppler, pressure and pulse volume recording. COMPARISON:  None. FINDINGS: Right ABI:  1.26 Left ABI:  1.11 Right Lower Extremity:  Normal arterial waveforms at the ankle. Left Lower Extremity:  Normal arterial waveforms at the ankle. 1.0-1.4 Normal IMPRESSION: Normal ankle-brachial indices bilaterally. No evidence for occlusive arterial disease in lower extremities. Electronically Signed   By: Markus Daft M.D.   On: 10/02/2020 08:01    Scheduled Meds: . amitriptyline  150 mg Oral QHS  . atorvastatin  20 mg Oral q1800  . bethanechol  25 mg Oral BID  . carvedilol  6.25 mg Oral BID WC  . furosemide  40 mg Oral Daily  . gabapentin  600 mg Oral TID  . heparin  5,000 Units Subcutaneous Q8H  . insulin aspart  0-15 Units Subcutaneous TID WC  . insulin  aspart  0-5 Units Subcutaneous QHS  . insulin glargine  25 Units Subcutaneous BID  . metroNIDAZOLE  500 mg Oral Q8H  . mirtazapine  15 mg Oral QHS  . multivitamin with minerals  1 tablet Oral Daily  . nicotine  21 mg Transdermal Daily  . pantoprazole  20 mg Oral Daily  . prazosin  5 mg Oral QHS  . Ensure Max Protein  11 oz Oral Daily  . QUEtiapine  800 mg Oral QHS  . sodium chloride flush  10-40 mL Intracatheter Q12H  . tamsulosin  0.4 mg Oral QHS  . venlafaxine XR  300 mg Oral QHS   Continuous Infusions: . sodium chloride 1,000 mL (10/02/20 0920)  .  ceFAZolin (ANCEF) IV 2 g (10/03/20 8250)    Assessment/Plan:  1. Osteomyelitis of the right fourth metatarsal.  Staph aureus growing out of culture.  Patient on IV Ancef.  Patient surgery got canceled yesterday with cocaine being in the urine toxicology.  Today's urine toxicology is free of cocaine so surgery will be scheduled for tomorrow.  Infectious disease and podiatry to then decide on IV or oral antibiotics based on the surgery performed. 2. 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  Patient on glargine insulin twice a day and sliding scale insulin. 3. Chronic kidney disease stage IIIa.  Creatinine today is 1.41.  Was as high as 1.61 and as low as 1.35 during the hospital course. 4. Hyperlipidemia unspecified on atorvastatin 5. Diabetic neuropathy with not much feeling in her feet 6. Essential hypertension on metoprolol 7. Bipolar disorder.  No changes in psychiatric medication 8. Chronic systolic congestive heart failure.  Continue oral Lasix and metoprolol.  No signs of congestive heart failure today 9. Stage II left heel pressure ulcer, present on admission    Code Status:     Code Status Orders  (From admission, onward)         Start     Ordered   10/01/20 0040  Full code  Continuous        10/01/20 0040        Code Status History    Date Active Date Inactive Code Status Order ID Comments  User Context   03/30/2020 2051 04/02/2020 2119 Full Code [id number]  Harvie Bridge, DO Inpatient   08/15/2019 1941 08/18/2019 1431 Full Code [id number]  Lang Snow, NP Inpatient   03/21/2019 2224 03/28/2019 1915 Full Code [id number]  Lance Coon, MD Inpatient   03/12/2019 1026 03/12/2019 1623 Full Code [id number]  Katha Cabal, MD Inpatient   04/28/2014 1601 05/14/2014 1403 Full Code [id number]  Love, Ivan Anchors, PA-C Inpatient  04/20/2014 1544 04/28/2014 1601 Full Code [id number]  Donita Brooks, NP ED   Advance Care Planning Activity     Family Communication: Spoke with the patient's mother on the phone Disposition Plan: Status is: Inpatient  Dispo: The patient is from: Home              Anticipated d/c is to: Home              Anticipated d/c date is: Likely will need a few days in the hospital postoperatively surgery to be scheduled for tomorrow.  Earliest potential likely Tuesday, December 14              Patient currently on IV antibiotics for osteomyelitis  Consultants:  Podiatry  Infectious disease  Antibiotics:  Ancef  Time spent: 27 minutes  [place]
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
## 2020-10-04 ENCOUNTER — Inpatient Hospital Stay: Payer: Medicaid Other | Admitting: Anesthesiology

## 2020-10-04 ENCOUNTER — Inpatient Hospital Stay: Payer: Medicaid Other

## 2020-10-04 ENCOUNTER — Encounter
Admission: EM | Disposition: A | Discharge status: Home Health | Payer: Self-pay | Source: Home / Self Care | Attending: Internal Medicine

## 2020-10-04 HISTORY — PX: IRRIGATION AND DEBRIDEMENT FOOT: SHX6602

## 2020-10-04 HISTORY — PX: AMPUTATION: SHX166

## 2020-10-04 LAB — GLUCOSE, CAPILLARY
Glucose-Capillary: 130 mg/dL — ABNORMAL HIGH (ref 70–99)
Glucose-Capillary: 112 mg/dL — ABNORMAL HIGH (ref 70–99)
Glucose-Capillary: 297 mg/dL — ABNORMAL HIGH (ref 70–99)
Glucose-Capillary: 173 mg/dL — ABNORMAL HIGH (ref 70–99)

## 2020-10-04 LAB — HAPTOGLOBIN: Haptoglobin: 307 mg/dL — ABNORMAL HIGH (ref 42–296)

## 2020-10-04 LAB — SURGICAL PCR SCREEN
MRSA, PCR: NEGATIVE
Staphylococcus aureus: POSITIVE — AB

## 2020-10-04 SURGERY — AMPUTATION, FOOT, RAY
Anesthesia: General | Laterality: Right

## 2020-10-04 MED ORDER — PROPOFOL 10 MG/ML IV BOLUS
INTRAVENOUS | Status: DC | PRN
  Administered 2020-10-04: 50 mg via INTRAVENOUS

## 2020-10-04 MED ORDER — SODIUM CHLORIDE (PF) 0.9 % IJ SOLN
INTRAMUSCULAR | Status: AC
  Filled 2020-10-04: qty 20

## 2020-10-04 MED ORDER — POVIDONE-IODINE 7.5 % EX SOLN
Freq: Once | CUTANEOUS | Status: DC
  Filled 2020-10-04: qty 118

## 2020-10-04 MED ORDER — SODIUM CHLORIDE 0.9 % IV SOLN: INTRAVENOUS | Status: DC | PRN

## 2020-10-04 MED ORDER — NEOMYCIN-POLYMYXIN B GU 40-200000 IR SOLN
Status: AC
  Filled 2020-10-04: qty 2

## 2020-10-04 MED ORDER — NEOMYCIN-POLYMYXIN B GU 40-200000 IR SOLN
Status: DC | PRN
  Administered 2020-10-04: 2 mL

## 2020-10-04 MED ORDER — CEFAZOLIN SODIUM-DEXTROSE 2-3 GM-%(50ML) IV SOLR
INTRAVENOUS | Status: DC | PRN
  Administered 2020-10-04: 2 g via INTRAVENOUS

## 2020-10-04 MED ORDER — METRONIDAZOLE IN NACL 5-0.79 MG/ML-% IV SOLN
500.0000 mg | Freq: Once | INTRAVENOUS | Status: AC
  Administered 2020-10-04: 500 mg via INTRAVENOUS
  Filled 2020-10-04: qty 100

## 2020-10-04 MED ORDER — BUPIVACAINE HCL (PF) 0.5 % IJ SOLN
INTRAMUSCULAR | Status: AC
  Filled 2020-10-04: qty 30

## 2020-10-04 MED ORDER — VITAMIN B-12 1000 MCG PO TABS
1000.0000 ug | ORAL_TABLET | Freq: Every day | ORAL | Status: DC
  Administered 2020-10-05 – 2020-10-07 (×2): 1000 ug via ORAL
  Filled 2020-10-04 (×2): qty 1

## 2020-10-04 MED ORDER — PROPOFOL 500 MG/50ML IV EMUL
INTRAVENOUS | Status: AC
  Filled 2020-10-04: qty 50

## 2020-10-04 MED ORDER — BUPIVACAINE HCL (PF) 0.5 % IJ SOLN
INTRAMUSCULAR | Status: DC | PRN
  Administered 2020-10-04: 20 mL

## 2020-10-04 MED ORDER — ONDANSETRON HCL 4 MG/2ML IJ SOLN
INTRAMUSCULAR | Status: AC
  Administered 2020-10-04: 4 mg via INTRAVENOUS
  Filled 2020-10-04: qty 2

## 2020-10-04 MED ORDER — FENTANYL CITRATE (PF) 100 MCG/2ML IJ SOLN: 25.0000 ug | INTRAMUSCULAR | Status: DC | PRN

## 2020-10-04 MED ORDER — LIDOCAINE HCL (CARDIAC) PF 100 MG/5ML IV SOSY
PREFILLED_SYRINGE | INTRAVENOUS | Status: DC | PRN
  Administered 2020-10-04: 80 mg via INTRAVENOUS

## 2020-10-04 MED ORDER — ONDANSETRON HCL 4 MG/2ML IJ SOLN: 4.0000 mg | Freq: Once | INTRAMUSCULAR | Status: DC | PRN

## 2020-10-04 MED ORDER — PROPOFOL 500 MG/50ML IV EMUL
INTRAVENOUS | Status: DC | PRN
  Administered 2020-10-04: 50 ug/kg/min via INTRAVENOUS

## 2020-10-04 MED ORDER — VANCOMYCIN HCL 1000 MG IV SOLR
INTRAVENOUS | Status: AC
  Filled 2020-10-04: qty 1000

## 2020-10-04 MED ORDER — CEFAZOLIN SODIUM 1 G IJ SOLR
INTRAMUSCULAR | Status: AC
  Filled 2020-10-04: qty 20

## 2020-10-04 MED ORDER — LIDOCAINE HCL (PF) 1 % IJ SOLN
INTRAMUSCULAR | Status: AC
  Filled 2020-10-04: qty 30

## 2020-10-04 SURGICAL SUPPLY — 76 items
BAG COUNTER SPONGE EZ (MISCELLANEOUS) ×3 IMPLANT
BAG SPNG 4X4 CLR HAZ (MISCELLANEOUS) ×2
BLADE MED AGGRESSIVE (BLADE) ×3 IMPLANT
BLADE OSC/SAGITTAL MD 5.5X18 (BLADE) IMPLANT
BLADE OSCILLATING/SAGITTAL (BLADE) ×3
BLADE SURG 15 STRL LF DISP TIS (BLADE) IMPLANT
BLADE SURG 15 STRL SS (BLADE) ×3
BLADE SURG MINI STRL (BLADE) IMPLANT
BLADE SW THK.38XMED LNG THN (BLADE) IMPLANT
BNDG CMPR STD VLCR NS LF 5.8X3 (GAUZE/BANDAGES/DRESSINGS)
BNDG CMPR STD VLCR NS LF 5.8X4 (GAUZE/BANDAGES/DRESSINGS) ×4
BNDG CONFORM 2 STRL LF (GAUZE/BANDAGES/DRESSINGS) IMPLANT
BNDG CONFORM 3 STRL LF (GAUZE/BANDAGES/DRESSINGS) IMPLANT
BNDG ELASTIC 3X5.8 VLCR NS LF (GAUZE/BANDAGES/DRESSINGS) IMPLANT
BNDG ELASTIC 4X5.8 VLCR NS LF (GAUZE/BANDAGES/DRESSINGS) ×2 IMPLANT
BNDG ELASTIC 4X5.8 VLCR STR LF (GAUZE/BANDAGES/DRESSINGS) ×3 IMPLANT
BNDG ESMARK 4X12 TAN STRL LF (GAUZE/BANDAGES/DRESSINGS) ×3 IMPLANT
BNDG GAUZE 4.5X4.1 6PLY STRL (MISCELLANEOUS) ×4 IMPLANT
CANISTER SUCT 1200ML W/VALVE (MISCELLANEOUS) ×3 IMPLANT
CANISTER SUCT 3000ML PPV (MISCELLANEOUS) IMPLANT
CNTNR SPEC 2.5X3XGRAD LEK (MISCELLANEOUS) ×4
CONT SPEC 4OZ STER OR WHT (MISCELLANEOUS) ×2
CONT SPEC 4OZ STRL OR WHT (MISCELLANEOUS) ×4
CONTAINER SPEC 2.5X3XGRAD LEK (MISCELLANEOUS) ×2 IMPLANT
COVER WAND RF STERILE (DRAPES) ×3 IMPLANT
CUFF TOURN DUAL PL 12 NO SLV (MISCELLANEOUS) IMPLANT
CUFF TOURN SGL QUICK 12 (TOURNIQUET CUFF) IMPLANT
CUFF TOURN SGL QUICK 18X4 (TOURNIQUET CUFF) ×1 IMPLANT
DRAPE BILAT LIMB 76X120 89291 (MISCELLANEOUS) ×1 IMPLANT
DRAPE FLUOR MINI C-ARM 54X84 (DRAPES) IMPLANT
DURAPREP 26ML APPLICATOR (WOUND CARE) ×3 IMPLANT
ELECT REM PT RETURN 9FT ADLT (ELECTROSURGICAL) ×3
ELECTRODE REM PT RTRN 9FT ADLT (ELECTROSURGICAL) ×2 IMPLANT
GAUZE PACKING 1/4 X5 YD (GAUZE/BANDAGES/DRESSINGS) IMPLANT
GAUZE PACKING IODOFORM 1X5 (PACKING) IMPLANT
GAUZE SPONGE 4X4 12PLY STRL (GAUZE/BANDAGES/DRESSINGS) ×7 IMPLANT
GAUZE XEROFORM 1X8 LF (GAUZE/BANDAGES/DRESSINGS) ×4 IMPLANT
GLOVE BIO SURGEON STRL SZ7 (GLOVE) ×3 IMPLANT
GLOVE INDICATOR 7.0 STRL GRN (GLOVE) ×3 IMPLANT
GOWN STRL REUS W/ TWL LRG LVL3 (GOWN DISPOSABLE) ×4 IMPLANT
GOWN STRL REUS W/TWL LRG LVL3 (GOWN DISPOSABLE) ×6
HANDPIECE VERSAJET DEBRIDEMENT (MISCELLANEOUS) IMPLANT
IV NS 1000ML (IV SOLUTION)
IV NS 1000ML BAXH (IV SOLUTION) IMPLANT
KIT TURNOVER KIT A (KITS) ×3 IMPLANT
LABEL OR SOLS (LABEL) ×2 IMPLANT
MANIFOLD NEPTUNE II (INSTRUMENTS) ×3 IMPLANT
NDL FILTER BLUNT 18X1 1/2 (NEEDLE) ×2 IMPLANT
NDL HYPO 25X1 1.5 SAFETY (NEEDLE) ×4 IMPLANT
NEEDLE FILTER BLUNT 18X 1/2SAF (NEEDLE) ×1
NEEDLE FILTER BLUNT 18X1 1/2 (NEEDLE) ×2 IMPLANT
NEEDLE HYPO 25X1 1.5 SAFETY (NEEDLE) ×6 IMPLANT
NS IRRIG 500ML POUR BTL (IV SOLUTION) ×3 IMPLANT
PACK EXTREMITY (MISCELLANEOUS) ×3 IMPLANT
PAD ABD DERMACEA PRESS 5X9 (GAUZE/BANDAGES/DRESSINGS) ×6 IMPLANT
PULSAVAC PLUS IRRIG FAN TIP (DISPOSABLE) ×3
RASP SM TEAR CROSS CUT (RASP) IMPLANT
SOL .9 NS 3000ML IRR  AL (IV SOLUTION)
SOL .9 NS 3000ML IRR AL (IV SOLUTION)
SOL .9 NS 3000ML IRR UROMATIC (IV SOLUTION) IMPLANT
SOL PREP PVP 2OZ (MISCELLANEOUS) ×3
SOLUTION PREP PVP 2OZ (MISCELLANEOUS) ×2 IMPLANT
STOCKINETTE 48X4 2 PLY STRL (GAUZE/BANDAGES/DRESSINGS) IMPLANT
STOCKINETTE STRL 4IN 9604848 (GAUZE/BANDAGES/DRESSINGS) ×6 IMPLANT
STOCKINETTE STRL 6IN 960660 (GAUZE/BANDAGES/DRESSINGS) ×3 IMPLANT
SUT ETHILON 3-0 FS-10 30 BLK (SUTURE) ×6
SUT ETHILON 4-0 (SUTURE) ×6
SUT ETHILON 4-0 FS2 18XMFL BLK (SUTURE) ×4
SUT VIC AB 3-0 SH 27 (SUTURE) ×3
SUT VIC AB 3-0 SH 27X BRD (SUTURE) ×2 IMPLANT
SUT VIC AB 4-0 FS2 27 (SUTURE) ×1 IMPLANT
SUTURE EHLN 3-0 FS-10 30 BLK (SUTURE) ×4 IMPLANT
SUTURE ETHLN 4-0 FS2 18XMF BLK (SUTURE) IMPLANT
SWAB CULTURE AMIES ANAERIB BLU (MISCELLANEOUS) IMPLANT
SYR 10ML LL (SYRINGE) ×3 IMPLANT
TIP FAN IRRIG PULSAVAC PLUS (DISPOSABLE) IMPLANT

## 2020-10-04 NOTE — Progress Notes (Signed)
Ordered and sent 1 IV dose of metronidazole. Patient in SDS and asleep and needs dose asap.

## 2020-10-04 NOTE — Progress Notes (Signed)
Patient ID: [name], female   DOB: 06-Sep-1976, 44 y.o.   MRN: [id number] [facility] PROGRESS NOTE  MAYDELL KNOEBEL GEZ:[id number] DOB: 02-10-1976 DOA: 09/30/2020 PCP: [facility]  HPI/Subjective:   Objective: Vitals:   10/04/20 1205 10/04/20 1247  BP: 119/86 103/80  Pulse: 95 79  Resp: 17 19  Temp: 98.3 F (36.8 C) 98.1 F (36.7 C)  SpO2: 99% 100%    Intake/Output Summary (Last 24 hours) at 10/04/2020 1326 Last data filed at 10/04/2020 1054 Gross per 24 hour  Intake 720 ml  Output 5 ml  Net 715 ml   Filed Weights   10/01/20 0500 10/02/20 1259 10/04/20 0500  Weight: 95.8 kg 88.5 kg 95.8 kg    ROS: Review of Systems  Respiratory: Negative for shortness of breath.   Cardiovascular: Negative for chest pain.  Gastrointestinal: Negative for abdominal pain, nausea and vomiting.  Musculoskeletal: Positive for joint pain.   Exam: Physical Exam HENT:     Head: Normocephalic.     Mouth/Throat:     Pharynx: No oropharyngeal exudate.  Eyes:     General: Lids are normal.     Conjunctiva/sclera: Conjunctivae normal.     Pupils: Pupils are equal, round, and reactive to light.  Cardiovascular:     Rate and Rhythm: Normal rate and regular rhythm.     Heart sounds: Normal heart sounds, S1 normal and S2 normal.  Pulmonary:     Breath sounds: No decreased breath sounds, wheezing, rhonchi or rales.  Abdominal:     Palpations: Abdomen is soft.     Tenderness: There is no abdominal tenderness.  Musculoskeletal:     Right ankle: No swelling.     Left ankle: No swelling.  Skin:    General: Skin is warm.     Findings: No rash.     Comments: Bilateral feet covered.  Neurological:     Mental Status: She is alert and oriented to person, place, and time.       Data Reviewed: Basic Metabolic Panel: Recent Labs  Lab 09/30/20 1615 10/02/20 0457 10/03/20 0411  NA 131* 137 134*  K 3.5 3.7 3.7  CL 93* 102 98  CO2 27 26 26   GLUCOSE 482* 97 231*  BUN  16 19 23*  CREATININE 1.61* 1.35* 1.41*  CALCIUM 8.8* 8.8* 9.1   Liver Function Tests: Recent Labs  Lab 09/30/20 1615  AST 26  ALT 19  ALKPHOS 112  BILITOT 0.5  PROT 8.0  ALBUMIN 2.9*   CBC: Recent Labs  Lab 09/30/20 1534 10/01/20 1910 10/02/20 0457 10/03/20 0411  WBC 10.3 9.9 8.6 8.2  NEUTROABS 8.8*  --   --   --   HGB 14.5 12.3 10.7* 11.4*  HCT 43.0 36.9 32.7* 34.8*  MCV 84.0 85.2 85.8 86.8  PLT 372 417* 359 372   CBG: Recent Labs  Lab 10/03/20 1205 10/03/20 1649 10/03/20 2124 10/04/20 0804 10/04/20 1127  GLUCAP 144* 180* 265* 130* 112*    Scheduled Meds: . amitriptyline  150 mg Oral QHS  . atorvastatin  20 mg Oral q1800  . bethanechol  25 mg Oral BID  . carvedilol  6.25 mg Oral BID WC  . furosemide  40 mg Oral Daily  . gabapentin  600 mg Oral TID  . heparin  5,000 Units Subcutaneous Q8H  . insulin aspart  0-15 Units Subcutaneous TID WC  . insulin aspart  0-5 Units Subcutaneous QHS  . insulin glargine  25 Units Subcutaneous BID  .  metroNIDAZOLE  500 mg Oral Q8H  . mirtazapine  15 mg Oral QHS  . multivitamin with minerals  1 tablet Oral Daily  . nicotine  21 mg Transdermal Daily  . pantoprazole  20 mg Oral Daily  . prazosin  5 mg Oral QHS  . Ensure Max Protein  11 oz Oral Daily  . QUEtiapine  800 mg Oral QHS  . sodium chloride flush  10-40 mL Intracatheter Q12H  . tamsulosin  0.4 mg Oral QHS  . venlafaxine XR  300 mg Oral QHS   Continuous Infusions: . sodium chloride 1,000 mL (10/02/20 0920)  .  ceFAZolin (ANCEF) IV 2 g (10/03/20 2216)    Assessment/Plan:  1. Osteomyelitis of the right fourth metatarsal.  Staph aureus growing out of culture.  Patient on IV Ancef.  Patient went to the operating room today for amputation and debridement.  Please see operative report by Dr. Luana Shu.  We will touch base with infectious disease specialist on Monday to decide on IV or oral antibiotics. 2. 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   Patient on glargine insulin twice a day and sliding scale insulin. 3. Chronic kidney disease stage IIIa.  Continue to monitor creatinine.  Yesterday's creatinine 1.41. 4. Hyperlipidemia unspecified.  Continue atorvastatin 5. Diabetic neuropathy.  Patient does not have much feeling in her feet. 6. Essential hypertension.  Continue metoprolol 7. Chronic systolic congestive heart failure.  No signs of heart failure currently.  On oral Lasix and Coreg. 8. Stage II left heel pressure ulcer present on admission 9. Bipolar disorder.  Continue psychiatric medications.      Code Status:     Code Status Orders  (From admission, onward)         Start     Ordered   10/01/20 0040  Full code  Continuous        10/01/20 0040        Code Status History    Date Active Date Inactive Code Status Order ID Comments User Context   03/30/2020 2051 04/02/2020 2119 Full Code [id number]  Harvie Bridge, DO Inpatient   08/15/2019 1941 08/18/2019 1431 Full Code [id number]  Lang Snow, NP Inpatient   03/21/2019 2224 03/28/2019 1915 Full Code [id number]  Lance Coon, MD Inpatient   03/12/2019 1026 03/12/2019 1623 Full Code [id number]  Katha Cabal, MD Inpatient   04/28/2014 1601 05/14/2014 1403 Full Code [id number]  Flora Lipps Inpatient   04/20/2014 1544 04/28/2014 1601 Full Code [id number]  Donita Brooks, NP ED   Advance Care Planning Activity     Family Communication: Updated patient's mother on the phone Disposition Plan: Status is: Inpatient  Dispo: The patient is from: Home              Anticipated d/c is to: Home              Anticipated d/c date is: Earliest potential would be December 14.              Patient currently being treated for osteomyelitis.  Patient went to the operating room today for debridement  Consultants:  Podiatry  Infectious disease  Procedures:  Right partial fourth and fifth ray amputations, right medial instep mid foot arch blister debridement,  left medial midfoot and heel posterior subcutaneous ulceration wound debridement  Antibiotics:  Ancef  Time spent: 26 minutes  Kaylan Friedmann [employer]

## 2020-10-04 NOTE — Op Note (Signed)
PODIATRY / FOOT AND ANKLE SURGERY OPERATIVE REPORT    SURGEON: Caroline More, DPM  PRE-OPERATIVE DIAGNOSIS:  1.  Osteomyelitis right fourth and fifth rays due to open ulceration between fourth and fifth toes with associated cellulitis 2.  Right medial midfoot arch blister 3.  Left heel and midfoot subcutaneous ulcerations 4.  Nonunion right ankle fracture, asymptomatic 5.  Diabetes type 2 with polyneuropathy 6.  PVD 7.  Noncompliance with medical treatment  POST-OPERATIVE DIAGNOSIS: Same  PROCEDURE(S): 1. Right partial fourth and fifth ray amputations 2. Right medial instep midfoot arch blister debridement/drainage to dermis 3. Left medial midfoot and heel posterior subcutaneous ulceration wound debridements  HEMOSTASIS: Right ankle tourniquet  ANESTHESIA: MAC  ESTIMATED BLOOD LOSS: 20 cc  FINDING(S): 1.  Osteomyelitis right fourth and fifth metatarsal phalangeal joints 2.  Subcutaneous ulcerations to the left posterior heel and plantar midfoot 3.  Right plantar midfoot blister  PATHOLOGY/SPECIMEN(S): Path specimen right fourth and fifth rays with proximal margin was marked in purple ink, wound culture right fourth metatarsal phalangeal joint, bone culture fourth metatarsal head  INDICATIONS:   [name] is a 44 y.o. female who presents with nonhealing ulcerations to both feet.  Patient was also noted to have a wound that probes to bone at the right fourth interspace level x-rays were taken in clinic and an MRI was performed while patient was in the hospital showing osteomyelitis of the fourth and fifth rays.  Patient also has wounds to the left posterior heel, left plantar midfoot, and blister formation to the right plantar arch.  Patient also has a history of right ankle fracture which she has been treated conservatively with wearing a boot and bone stimulator.  Patient has not been compliant with usage of the boot and has been ambulating outside of the boot and surgical  shoe despite instructions.  Patient also has a history of left transmetatarsal amputation due to diabetic foot ulcers and bone infection in the past.  Discussed all treatment options with the patient both conservative and surgical attempts at correction including potential risks and complications and at this time patient is elected for surgery today consisting of wound debridements to both feet as well as partial right fourth and fifth ray amputations.  DESCRIPTION: After obtaining full informed written consent, the patient was brought back to the operating room and placed supine upon the operating table.  The patient received IV antibiotics prior to induction.  After obtaining adequate anesthesia, a preoperative block was performed around the ulcerated areas and a midfoot block was performed with 20 cc of half percent Marcaine plain total.  The patient was prepped and draped in the standard fashion.  An Esmarch bandage was used to exsanguinate the right lower extremity pneumatic ankle tourniquet was inflated.  Attention was then directed to the right fourth and fifth rays where a racquet type of incision was made around the bases of the proximal phalanxes of the fourth and fifth toes and the racquet type incision was extended around the fifth metatarsal phalangeal joint to the lateral midshaft portion of the fifth metatarsal.  The incision was made straight to bone.  The fourth and fifth metatarsal phalangeal joints were identified and all soft tissue structures were resected in these areas and the fourth and fifth toes were disarticulated and passed off the operative site.  A wound culture was taken also during that time passed off.  Circumferential dissection was then performed around the fourth and fifth metatarsal heads and necks to  the level of the midshaft of both raising full-thickness flaps.  The fourth metatarsal head appeared to be crumbling and falling apart and this portion was passed off once it  was removed and sent off his bone culture fourth metatarsal.  The fifth metatarsal head also appeared to be fairly soft and dusky in nature consistent with probable osteomyelitic changes.  The sagittal bone saw was then used to resect the fourth and fifth metatarsals at the midshaft level with the appropriate beveling.  The fourth and fifth partial metatarsals were passed off the operative site once dissected free and the proximal margins were marked in purple ink and sent off to pathology with the rest of the digits.  The remaining metatarsals appear to be hard in nature with no further evidence of osteomyelitis.  The extensor tendon and flexor tendons were cut as far proximal as possible and the plantar plates were removed and passed off the operative site.  The tissues were divided further to remove any further nonviable necrotic tissue at the level amputation site.  The pulse lavage was then used to flush the wound with 3 L of normal sterile saline.  The deep subcutaneous tissue was then reapproximated well coapted with 3-0 Vicryl and the skin was then reapproximated well coapted with 3-0 nylon.  Attention was then directed to the right medial instep area where a blister which measured approximately 1 cm x 1.5 cm was punctured and improved with a 15 blade, serous fluid was able to be expressed.  The dermis appeared to be intact, measurement was the same to dermis.  A postoperative dressing was then applied to the right foot consisting of Xeroform to the incision site and the dermal blister followed by 4 x 4 gauze, ABD, Kerlix, and Ace wrap.  The pneumatic ankle tourniquet was deflated prior to placing the postoperative dressing and a prompt hyperemic response was noted all digits of the right foot.  Attention was then directed to the left foot starting at the medial midfoot ulceration which measured 0.9 cm x 0.6 cm x 0.3 cm predebridement.  Subcutaneous excisional wound debridement was performed with a 15  blade without incident removing hyperkeratotic tissue as well as biofilm and fibrous tissue to a granular bleeding base.  The wound bed appeared to be 100 percent granular post debridement.  Post debridement measurement was the same.  Hemostasis was achieved with compression.  Attention was then directed to the posterior aspect of the left heel which had an ulceration present which measures approximately 0.7 x 0.5 x 0.1 cm.  Subcutaneous excisional wound debridement was performed with 15 blade and curette without incident removing hyperkeratotic tissue as well as biofilm and fibrous tissue to granular bleeding base, the wound bed appeared to be 100% granular after debridement.  Post debridement measurement was the same as predebridement.  Hemostasis was achieved with compression.  A postoperative dressing was then applied to the left foot consisting of Xeroform to both ulceration sites followed by 4 x 4 gauze, ABD, Kerlix, Ace wrap.  The patient tolerated the procedure and anesthesia well was transferred to recovery room vital signs stable vascular status intact to all toes of the right foot and TMA site left foot.  Patient will continue to be followed up with until discharge.  Likely for dressing change tomorrow.  Patient have vascular intervention tomorrow as well in the right lower extremity consisting of CT angiogram.  Appreciate infectious disease recommendations for antibiotic therapy.  Likely depending on cultures that patient  can go home on p.o. antibiotics believe that infection was eradicated with amputation today.  Patient should try to stay off the right foot is much as possible due to ankle fracture nonunion as well as the amputation site.  Patient showed for the most part try to stay off the left foot as well due to the ulcerations present.  Patient can be partial weightbearing with heel contact to the right foot and can be weightbearing as tolerated to the left side with a surgical  shoe.  COMPLICATIONS: None  CONDITION: Good, stable  Caroline More, DPM

## 2020-10-04 NOTE — Transfer of Care (Signed)
Immediate Anesthesia Transfer of Care Note  Patient: [name]  Procedure(s) Performed: PARTIAL AMPUTATION RAY 4th and 5th (Right ) IRRIGATION AND DEBRIDEMENT FOOT (Left )  Patient Location: PACU  Anesthesia Type:General  Level of Consciousness: awake, drowsy and patient cooperative  Airway & Oxygen Therapy: Patient Spontanous Breathing and Patient connected to face mask oxygen  Post-op Assessment: Report given to RN and Post -op Vital signs reviewed and stable  Post vital signs: Reviewed and stable  Last Vitals:  Vitals Value Taken Time  BP 109/86 10/04/20 1115  Temp 37 C 10/04/20 1115  Pulse 93 10/04/20 1121  Resp 26 10/04/20 1121  SpO2 97 % 10/04/20 1121  Vitals shown include unvalidated device data.  Last Pain:  Vitals:   10/04/20 1115  TempSrc: Temporal  PainSc: 0-No pain         Complications: No complications documented.

## 2020-10-04 NOTE — Anesthesia Postprocedure Evaluation (Signed)
Anesthesia Post Note  Patient: [name]  Procedure(s) Performed: PARTIAL AMPUTATION RAY 4th and 5th (Right ) IRRIGATION AND DEBRIDEMENT FOOT (Left )  Patient location during evaluation: PACU Anesthesia Type: General Level of consciousness: awake and alert Pain management: pain level controlled Vital Signs Assessment: post-procedure vital signs reviewed and stable Respiratory status: spontaneous breathing and respiratory function stable Cardiovascular status: stable Anesthetic complications: no   No complications documented.   Last Vitals:  Vitals:   10/04/20 1344 10/04/20 1430  BP: 105/75 102/80  Pulse: 61 90  Resp: 19 19  Temp: 36.6 C (!) 36.4 C  SpO2: 100% 100%    Last Pain:  Vitals:   10/04/20 1430  TempSrc: Oral  PainSc:                  Parthiv Mucci K

## 2020-10-04 NOTE — H&P (Signed)
HISTORY AND PHYSICAL INTERVAL NOTE:  10/04/2020  9:30 AM  [name]  has presented today for surgery, with the diagnosis of osteomyelitis right fourth and fifth rays, wounds to both feet, uncontrolled diabetes type 2 with polyneuropathy, PVD.  The various methods of treatment have been discussed with the patient.  No guarantees were given.  After consideration of risks, benefits and other options for treatment, the patient has consented to surgery.  I have reviewed the patients' chart and labs.    Procedure: Right partial fourth and fifth ray amputations, wound debridements both feet   A history and physical examination was performed in the hospital.  The patient was reexamined.  There have been no changes to this history and physical examination.  Caroline More, DPM

## 2020-10-04 NOTE — Anesthesia Preprocedure Evaluation (Signed)
Anesthesia Evaluation  Patient identified by MRN, date of birth, ID band Patient awake    Reviewed: Allergy & Precautions, H&P , NPO status , Patient's Chart, lab work & pertinent test results  Airway Mallampati: II  TM Distance: <3 FB     Dental  (+) Edentulous Upper, Edentulous Lower   Pulmonary shortness of breath and with exertion, COPD, Current Smoker,     (-) decreased breath sounds      Cardiovascular hypertension, + Peripheral Vascular Disease and +CHF (LVEF 45-50%)       Neuro/Psych Seizures -,  PSYCHIATRIC DISORDERS Anxiety Bipolar Disorder    GI/Hepatic GERD  ,(+)     substance abuse (cocaine negative yesterday)  cocaine use, marijuana use and IV drug use,   Endo/Other  diabetes  Renal/GU Renal InsufficiencyRenal disease     Musculoskeletal  (+) Arthritis , Osteoarthritis,    Abdominal   Peds  Hematology negative hematology ROS (+)   Anesthesia Other Findings   Reproductive/Obstetrics negative OB ROS                             Anesthesia Physical  Anesthesia Plan  ASA: III and emergent  Anesthesia Plan: General   Post-op Pain Management:    Induction:   PONV Risk Score and Plan: Dexamethasone, Ondansetron and Treatment may vary due to age or medical condition  Airway Management Planned:   Additional Equipment:   Intra-op Plan:   Post-operative Plan: Extubation in OR  Informed Consent: I have reviewed the patients History and Physical, chart, labs and discussed the procedure including the risks, benefits and alternatives for the proposed anesthesia with the patient or authorized representative who has indicated his/her understanding and acceptance.       Plan Discussed with:   Anesthesia Plan Comments:         Anesthesia Quick Evaluation

## 2020-10-05 ENCOUNTER — Other Ambulatory Visit (INDEPENDENT_AMBULATORY_CARE_PROVIDER_SITE_OTHER): Payer: Self-pay | Admitting: Vascular Surgery

## 2020-10-05 ENCOUNTER — Encounter: Payer: Self-pay | Admitting: Podiatry

## 2020-10-05 DIAGNOSIS — L89899 Pressure ulcer of other site, unspecified stage: Secondary | ICD-10-CM

## 2020-10-05 DIAGNOSIS — I739 Peripheral vascular disease, unspecified: Secondary | ICD-10-CM

## 2020-10-05 DIAGNOSIS — D638 Anemia in other chronic diseases classified elsewhere: Secondary | ICD-10-CM

## 2020-10-05 LAB — RESP PANEL BY RT-PCR (FLU A&B, COVID) ARPGX2
Influenza A by PCR: NEGATIVE
Influenza B by PCR: NEGATIVE
SARS Coronavirus 2 by RT PCR: NEGATIVE

## 2020-10-05 LAB — BASIC METABOLIC PANEL
Anion gap: 8 (ref 5–15)
BUN: 18 mg/dL (ref 6–20)
CO2: 25 mmol/L (ref 22–32)
Calcium: 8.7 mg/dL — ABNORMAL LOW (ref 8.9–10.3)
Chloride: 103 mmol/L (ref 98–111)
Creatinine, Ser: 1.27 mg/dL — ABNORMAL HIGH (ref 0.44–1.00)
GFR, Estimated: 53 mL/min — ABNORMAL LOW (ref >60)
Glucose, Bld: 121 mg/dL — ABNORMAL HIGH (ref 70–99)
Potassium: 4.6 mmol/L (ref 3.5–5.1)
Sodium: 136 mmol/L (ref 135–145)

## 2020-10-05 LAB — CBC
HCT: 36.6 % (ref 36.0–46.0)
Hemoglobin: 11.9 g/dL — ABNORMAL LOW (ref 12.0–15.0)
MCH: 27.9 pg (ref 26.0–34.0)
MCHC: 32.5 g/dL (ref 30.0–36.0)
MCV: 85.9 fL (ref 80.0–100.0)
Platelets: 365 10*3/uL (ref 150–400)
RBC: 4.26 MIL/uL (ref 3.87–5.11)
RDW: 13.5 % (ref 11.5–15.5)
WBC: 9.4 10*3/uL (ref 4.0–10.5)
nRBC: 0 % (ref 0.0–0.2)

## 2020-10-05 LAB — GLUCOSE, CAPILLARY
Glucose-Capillary: 116 mg/dL — ABNORMAL HIGH (ref 70–99)
Glucose-Capillary: 134 mg/dL — ABNORMAL HIGH (ref 70–99)
Glucose-Capillary: 221 mg/dL — ABNORMAL HIGH (ref 70–99)
Glucose-Capillary: 222 mg/dL — ABNORMAL HIGH (ref 70–99)

## 2020-10-05 MED ORDER — ROPINIROLE HCL 1 MG PO TABS
0.5000 mg | ORAL_TABLET | Freq: Every day | ORAL | Status: DC
  Administered 2020-10-05 – 2020-10-06 (×2): 0.5 mg via ORAL
  Filled 2020-10-05 (×2): qty 1

## 2020-10-05 MED ORDER — MUPIROCIN 2 % EX OINT
1.0000 "application " | TOPICAL_OINTMENT | Freq: Two times a day (BID) | CUTANEOUS | Status: DC
  Administered 2020-10-05 – 2020-10-06 (×4): 1 via NASAL
  Filled 2020-10-05 (×2): qty 22

## 2020-10-05 MED ORDER — CARVEDILOL 3.125 MG PO TABS
3.1250 mg | ORAL_TABLET | Freq: Two times a day (BID) | ORAL | Status: DC
  Administered 2020-10-05 – 2020-10-07 (×5): 3.125 mg via ORAL
  Filled 2020-10-05 (×5): qty 1

## 2020-10-05 MED ORDER — SODIUM CHLORIDE 0.9 % IV SOLN: INTRAVENOUS | Status: DC

## 2020-10-05 MED ORDER — FUROSEMIDE 40 MG PO TABS: 40.0000 mg | ORAL_TABLET | Freq: Every day | ORAL | Status: DC

## 2020-10-05 MED ORDER — CHLORHEXIDINE GLUCONATE CLOTH 2 % EX PADS
6.0000 | MEDICATED_PAD | Freq: Every day | CUTANEOUS | Status: DC
  Administered 2020-10-05 – 2020-10-07 (×3): 6 via TOPICAL

## 2020-10-05 NOTE — Evaluation (Signed)
Physical Therapy Evaluation Patient Details Name: [name] MRN: [id number] DOB: 1976/09/01 Today's Date: 10/05/2020   History of Present Illness  Pt is a 44 y.o. female presenting to hospital 12/8 with worsening ulceration wound of R 5th toe.  Pt admitted with osteomyelitis R foot and hyperglycemia.  Pt s/p R partial 4th and 5th ray amputation along with wound debridements to both feet 10/04/20.  PMH includes L TMA and L tendo achilles lenthening (TAL ) 03/31/20, diabetic foot wounds, bipolar disorder, CHF, COPD, DM, htn, seizures, h/o L ankle fx surgery.  Clinical Impression  Prior to hospital admission, pt reports being ambulatory with rollator; had assist for showers at times; and lives with her cousin in 1 level home with ramp to enter.  Therapist searched pt's room for her post op/surgical shoes but unable to locate (pt reports her mother probably took them home and would not return them because they are not on good terms).  Unable to get pt up d/t not having appropriate footwear to protect B feet (secure message sent to MD Luana Shu who placed order for B post op shoes; nurse also notified of situation and new order).  Pt demonstrated modified independence with bed mobility and able to tolerate sitting and supine B LE ex's fairly well.  Will assess transfers next session once proper footwear obtained.  Pt would benefit from skilled PT to address noted impairments and functional limitations (see below for any additional details).  Upon hospital discharge, anticipate pt would benefit from HHPT.    Follow Up Recommendations Home health PT    Equipment Recommendations   (pt has needed DME at home already)    Recommendations for Other Services       Precautions / Restrictions Precautions Precautions: Fall Precaution Comments: Surgical shoes to both feet Restrictions Weight Bearing Restrictions: Yes RLE Weight Bearing: Partial weight bearing RLE Partial Weight Bearing Percentage or Pounds:  heel contact (per clarification with MD Luana Shu 12/13, pt can wear surgical shoe R foot instead of surgical boot) Other Position/Activity Restrictions: Per PT order "R ankle fracture non-union, R PWB heel contact in surgical boot - transfers for the most part only until incision heals in 2-3 weeks"      Mobility  Bed Mobility Overal bed mobility: Modified Independent             General bed mobility comments: Semi-supine to/from sitting without any noted difficulty    Transfers                 General transfer comment: Deferred d/t appropriate footwear not available for safe transfer  Ambulation/Gait                Stairs            Wheelchair Mobility    Modified Rankin (Stroke Patients Only)       Balance Overall balance assessment: Needs assistance Sitting-balance support: No upper extremity supported;Feet unsupported Sitting balance-Leahy Scale: Normal Sitting balance - Comments: steady sitting reaching outside BOS                                     Pertinent Vitals/Pain Pain Assessment: 0-10 Pain Score: 5  Pain Location: R foot surgical site Pain Descriptors / Indicators: Aching;Sore Pain Intervention(s): Limited activity within patient's tolerance;Monitored during session;Repositioned;Other (comment) (R LE elevated)  Vitals (HR and O2 on room air) stable and WFL throughout treatment session.  Home Living Family/patient expects to be discharged to:: Private residence Living Arrangements: Other relatives (pt's cousin) Available Help at Discharge: Family;Available 24 hours/day Type of Home: House Home Access: Ramped entrance     Home Layout: One level Home Equipment: Walker - 4 wheels;Bedside commode;Shower seat;Wheelchair - manual Additional Comments: has drop-arm bedside commode; manual w/c has elevating legrests and removeable armrests    Prior Function Level of Independence: Needs assistance   Gait / Transfers  Assistance Needed: Ambulating with rollator modified independently  ADL's / Homemaking Assistance Needed: Assist for showers at times        Hand Dominance        Extremity/Trunk Assessment   Upper Extremity Assessment Upper Extremity Assessment: Generalized weakness    Lower Extremity Assessment Lower Extremity Assessment: Generalized weakness    Cervical / Trunk Assessment Cervical / Trunk Assessment: Normal  Communication   Communication: No difficulties  Cognition Arousal/Alertness: Awake/alert Behavior During Therapy: WFL for tasks assessed/performed Overall Cognitive Status: Within Functional Limits for tasks assessed                                        General Comments   Nursing cleared pt for participation in physical therapy.  Pt agreeable to PT session.    Exercises General Exercises - Lower Extremity Short Arc Quad: AROM;Strengthening;Both;10 reps;Supine Long Arc Quad: AROM;Strengthening;Both;10 reps;Seated Heel Slides: AROM;Strengthening;Both;10 reps;Supine Hip Flexion/Marching: AROM;Strengthening;Both;10 reps;Seated   Assessment/Plan    PT Assessment Patient needs continued PT services  PT Problem List Decreased strength;Decreased activity tolerance;Decreased balance;Decreased mobility;Decreased knowledge of use of DME;Decreased knowledge of precautions;Pain;Decreased skin integrity       PT Treatment Interventions DME instruction;Functional mobility training;Therapeutic activities;Therapeutic exercise;Balance training;Patient/family education    PT Goals (Current goals can be found in the Care Plan section)  Acute Rehab PT Goals Patient Stated Goal: to go home PT Goal Formulation: With patient Time For Goal Achievement: 10/19/20 Potential to Achieve Goals: Good    Frequency 7X/week   Barriers to discharge        Co-evaluation               AM-PAC PT "6 Clicks" Mobility  Outcome Measure Help needed turning from  your back to your side while in a flat bed without using bedrails?: None Help needed moving from lying on your back to sitting on the side of a flat bed without using bedrails?: None Help needed moving to and from a bed to a chair (including a wheelchair)?: A Little Help needed standing up from a chair using your arms (e.g., wheelchair or bedside chair)?: A Little Help needed to walk in hospital room?: A Lot Help needed climbing 3-5 steps with a railing? : A Lot 6 Click Score: 18    End of Session   Activity Tolerance: Patient tolerated treatment well Patient left: in bed;with call bell/phone within reach;with bed alarm set;Other (comment) (B LE's elevated on pillows) Nurse Communication: Mobility status;Precautions;Weight bearing status;Other (comment) (pt did not have appropriate footwear in room) PT Visit Diagnosis: Other abnormalities of gait and mobility (R26.89);Muscle weakness (generalized) (M62.81)    Time: 1610-9604 PT Time Calculation (min) (ACUTE ONLY): 30 min   Charges:   PT Evaluation $PT Eval Low Complexity: 1 Low PT Treatments $Therapeutic Exercise: 8-22 mins       Leitha Bleak, PT 10/05/20, 5:36 PM

## 2020-10-05 NOTE — Progress Notes (Signed)
[facility] Daily Progress Note   Subjective: The patient is a 44 year old female multiple medical issues including known atherosclerotic disease.  Patient was last seen in 2021 and recommended she undergo an angiogram however was lost to follow-up.  Objective: Vitals:   10/04/20 2132 10/04/20 2317 10/05/20 0410 10/05/20 0826  BP: 110/78 133/83 109/79 118/87  Pulse: 92 93 99 (!) 106  Resp: 20 16 17 16   Temp: 98.7 F (37.1 C) 98.3 F (36.8 C) 98.4 F (36.9 C) 98.8 F (37.1 C)  TempSrc:      SpO2: 100% 100% 95% 97%  Weight:   95.8 kg   Height:        Intake/Output Summary (Last 24 hours) at 10/05/2020 1155 Last data filed at 10/05/2020 0700 Gross per 24 hour  Intake 557 ml  Output 1250 ml  Net -693 ml   Physical Exam: A&Ox3, NAD CV: RRR Pulmonary: CTA Bilaterally Abdomen: Soft, Nontender, Nondistended Vascular:  Right lower extremity: Thigh soft.  Calves appear extremities from distally to toes.  Podiatry dressing intact clean and dry.   Laboratory: CBC    Component Value Date/Time   WBC 9.4 10/05/2020 0836   HGB 11.9 (L) 10/05/2020 0836   HCT 36.6 10/05/2020 0836   PLT 365 10/05/2020 0836   BMET    Component Value Date/Time   NA 136 10/05/2020 0809   K 4.6 10/05/2020 0809   CL 103 10/05/2020 0809   CO2 25 10/05/2020 0809   GLUCOSE 121 (H) 10/05/2020 0809   BUN 18 10/05/2020 0809   CREATININE 1.27 (H) 10/05/2020 0809   CREATININE 1.54 (H) 02/26/2019 1139   CALCIUM 8.7 (L) 10/05/2020 0809   GFRNONAA 53 (L) 10/05/2020 0809   GFRAA 52 (L) 04/02/2020 0457   Assessment/Planning: The patient is a 44 year old female known to our service last seen in 2021 atherosclerotic disease is lost to follow-up and presents with right foot status post podiatry intervention  1) the patient is known to our service last seen in 2020 with known history of atherosclerotic disease. Patient was recommended to undergo angiogram at that time however was lost to  follow-up.  Presents now with chronic wound recently debrided by podiatry.  Recommend moving forward with angiogram tomorrow.  Right lower extremity angiogram with possible intervention was explained to the patient.  All questions answered.  Patient wished to proceed.  Discussed with Dr. Eber Hong Timathy Newberry PA-C 10/05/2020 11:55 AM

## 2020-10-05 NOTE — Progress Notes (Addendum)
ID Underwent surgery yesterday Rt partial fourth and fifth ray amputations Right medial instep midfoot arch blister debridement/drainage to dermis Left medial midfoot and heel posterior subcutaneous ulceration wound debridements Pt seen with podiatrist Mom at bed side  Doing okay No fever No sob Pain  O/e Awake and alert Patient Vitals for the past 24 hrs:  BP Temp Temp src Pulse Resp SpO2 Weight  10/05/20 0826 118/87 98.8 F (37.1 C) -- (!) 106 16 97 % --  10/05/20 0410 109/79 98.4 F (36.9 C) -- 99 17 95 % 95.8 kg  10/04/20 2317 133/83 98.3 F (36.8 C) -- 93 16 100 % --  10/04/20 2132 110/78 98.7 F (37.1 C) -- 92 20 100 % --  10/04/20 1704 108/84 98.9 F (37.2 C) Oral 96 19 97 % --  10/04/20 1518 109/76 97.8 F (36.6 C) Oral 92 19 99 % --  10/04/20 1430 102/80 (!) 97.5 F (36.4 C) Oral 90 19 100 % --  10/04/20 1344 105/75 97.9 F (36.6 C) Oral 61 19 100 % --  10/04/20 1247 103/80 98.1 F (36.7 C) Oral 79 19 100 % --  10/04/20 1205 119/86 98.3 F (36.8 C) Oral 95 17 99 % --  10/04/20 1145 114/86 97.7 F (36.5 C) -- 95 -- 98 % --  10/04/20 1130 118/85 -- -- 95 11 97 % --  10/04/20 1115 109/86 98.6 F (37 C) Temporal 96 15 99 % --   Chest b/l air enry [id number] And soft Cns non focal Rt foot- 4th /5th toe amputation surgical site looks clean , with no discharge or erythema     Labs CBC Latest Ref Rng & Units 10/05/2020 10/03/2020 10/02/2020  WBC 4.0 - 10.5 K/uL 9.4 8.2 8.6  Hemoglobin 12.0 - 15.0 g/dL 11.9(L) 11.4(L) 10.7(L)  Hematocrit 36.0 - 46.0 % 36.6 34.8(L) 32.7(L)  Platelets 150 - 400 K/uL 365 372 359    CMP Latest Ref Rng & Units 10/05/2020 10/03/2020 10/02/2020  Glucose 70 - 99 mg/dL 121(H) 231(H) 97  BUN 6 - 20 mg/dL 18 23(H) 19  Creatinine 0.44 - 1.00 mg/dL 1.27(H) 1.41(H) 1.35(H)  Sodium 135 - 145 mmol/L 136 134(L) 137  Potassium 3.5 - 5.1 mmol/L 4.6 3.7 3.7  Chloride 98 - 111 mmol/L 103 98 102  CO2 22 - 32 mmol/L 25 26 26   Calcium 8.9 -  10.3 mg/dL 8.7(L) 9.1 8.8(L)  Total Protein 6.5 - 8.1 g/dL - - -  Total Bilirubin 0.3 - 1.2 mg/dL - - -  Alkaline Phos 38 - 126 U/L - - -  AST 15 - 41 U/L - - -  ALT 0 - 44 U/L - - -   Impression/recommendation  Diabetic foot infection Rt- with ulcer and osteo of the 4th toe- s/p partial amputaiton of 4/5th toes- therapeutic amputation Will not need IV antibitoics on discharge Pt currently on cefazolin as she had MSSA cultured on 09/11/20 On cefazolin and flagyl- will deesclate according to culture result If culture neg then she would go on PO augmentin/or PO keflex for 7 days  PAD- awaiting vascular intervention  All left toes are amputated due to infection/DM Has a mid foot superficial wound- which has been debrided on 10/04/20  DM on insulin  CKD  Anemia of chronic disease Bipolar disorder Smoker- advised to quit smoking for wound healing Cocaine use Discussed the management with patient and her mom at bed side Pt seen with Dr.Baker podaitrist

## 2020-10-05 NOTE — Plan of Care (Signed)
  Problem: Clinical Measurements: Goal: Respiratory complications will improve Outcome: Progressing   Problem: Activity: Goal: Risk for activity intolerance will decrease Outcome: Progressing   

## 2020-10-05 NOTE — Progress Notes (Signed)
Inpatient Diabetes Program Recommendations  AACE/ADA: New Consensus Statement on Inpatient Glycemic Control   Target Ranges:  Prepandial:   less than 140 mg/dL      Peak postprandial:   less than 180 mg/dL (1-2 hours)      Critically ill patients:  140 - 180 mg/dL   Results for MALI, EPPARD (MRN [id number]) as of 10/05/2020 15:12  Ref. Range 10/04/2020 08:04 10/04/2020 11:27 10/04/2020 17:32 10/04/2020 21:34 10/05/2020 08:28 10/05/2020 11:12  Glucose-Capillary Latest Ref Range: 70 - 99 mg/dL 130 (H) 112 (H) 297 (H) 173 (H) 116 (H) 134 (H)  Results for CHERENE, DOBBINS (MRN [id number]) as of 10/05/2020 15:12  Ref. Range 03/31/2020 04:44 10/01/2020 19:10  Hemoglobin A1C Latest Ref Range: 4.8 - 5.6 % 7.3 (H) 12.2 (H)   Review of Glycemic Control  Diabetes history: DM2 Outpatient Diabetes medications:Lantus 30 units BID, Metformin 1000 mg once a day at HS, Glipizide 10 mg once a day at bedtime Current orders for Inpatient glycemic control: Lantus 25 units BID, Novolog 0-15 units TID with meals, Novolog 0-5 units QHS  Inpatient Diabetes Program Recommendations:    HbgA1C:  A1C 12.2% on 10/01/20 indicating an average glucsoe of 303 mg/dl over the past 2-3 months.  Outpatient DM needs: At time of discharge, please provide Rx for insulin syringes [phone]).  NOTE: Spoke with patient about diabetes and home regimen for diabetes control. Patient reports being followed by PCP for diabetes management and currently taking Lantus 30 units BID, Novolog when needed, Metformin 1000 mg QHS and Glipizide 10 mg QHS as an outpatient for diabetes control. Patient states she was not aware that the Metformin and Glipizide were DM medications but she states she takes them both every night at bedtime. Patient states she rarely needs to take the Novolog and not sure when she last took it.  Patient reports that she checks glucose 1-2 times per day and states that lately her glucose has been "running better".  Patient  reports that no changes have been made recently with DM medications. Discussed A1C results (12.2% on 10/01/20) and explained that current A1C indicates an average glucose of 303 mg/dl over the past 2-3 months. Discussed glucose and A1C goals. Discussed importance of checking CBGs and maintaining good CBG control to prevent long-term and short-term complications. Explained how hyperglycemia leads to damage within blood vessels which lead to the common complications seen with uncontrolled diabetes. Stressed to the patient the importance of improving glycemic control to prevent further complications from uncontrolled diabetes and especially due to foot wounds and following surgical procedures.  Discussed impact of nutrition, exercise, stress, sickness, and medications on diabetes control.  Discussed carbohydrates, carbohydrate goals per day and meal, along with portion sizes. Patient reports that she is drinking regular Mt Dew (1-2 2 liters per day) and eats one meal per day. Discussed eliminating sugary beverages and encouraged patient to drink diet sodas instead of regular sodas. Patient states she will try to cut back. Discussed importance of nutrution especially for wound healing.  Encouraged patient to check glucose 3-4 times per day (before meals and at bedtime) and to keep a log book of glucose readings and DM medication taken which patient will need to take to doctor appointments. Explained how the doctor can use the log book to continue to make adjustments with DM medications if needed. Encouraged patient to ask PCP about using CGM such as FreeStyle [place] which can provide more information on glucose trends and help providers to make  adjustments with DM medications. Encouraged patient to follow up PCP about DM control and to call for advice if glucose is consistently over 180 mg/dl at home.  Patient states that she needs a prescription for insulin syringes at time of discharge and has all other needed DM  medications and supplies. Patient verbalized understanding of information discussed and reports no further questions at this time related to diabetes.  Thanks, Barnie Alderman, RN, MSN, CDE Diabetes Coordinator Inpatient Diabetes Program [phone] (Team Pager)

## 2020-10-05 NOTE — H&P (View-Only) (Signed)
[name] Daily Progress Note   Subjective: The patient is a 44 year old female multiple medical issues including known atherosclerotic disease.  Patient was last seen in 2021 and recommended she undergo an angiogram however was lost to follow-up.  Objective: Vitals:   10/04/20 2132 10/04/20 2317 10/05/20 0410 10/05/20 0826  BP: 110/78 133/83 109/79 118/87  Pulse: 92 93 99 (!) 106  Resp: 20 16 17 16   Temp: 98.7 F (37.1 C) 98.3 F (36.8 C) 98.4 F (36.9 C) 98.8 F (37.1 C)  TempSrc:      SpO2: 100% 100% 95% 97%  Weight:   95.8 kg   Height:        Intake/Output Summary (Last 24 hours) at 10/05/2020 1155 Last data filed at 10/05/2020 0700 Gross per 24 hour  Intake 557 ml  Output 1250 ml  Net -693 ml   Physical Exam: A&Ox3, NAD CV: RRR Pulmonary: CTA Bilaterally Abdomen: Soft, Nontender, Nondistended Vascular:  Right lower extremity: Thigh soft.  Calves appear extremities from distally to toes.  Podiatry dressing intact clean and dry.   Laboratory: CBC    Component Value Date/Time   WBC 9.4 10/05/2020 0836   HGB 11.9 (L) 10/05/2020 0836   HCT 36.6 10/05/2020 0836   PLT 365 10/05/2020 0836   BMET    Component Value Date/Time   NA 136 10/05/2020 0809   K 4.6 10/05/2020 0809   CL 103 10/05/2020 0809   CO2 25 10/05/2020 0809   GLUCOSE 121 (H) 10/05/2020 0809   BUN 18 10/05/2020 0809   CREATININE 1.27 (H) 10/05/2020 0809   CREATININE 1.54 (H) 02/26/2019 1139   CALCIUM 8.7 (L) 10/05/2020 0809   GFRNONAA 53 (L) 10/05/2020 0809   GFRAA 52 (L) 04/02/2020 0457   Assessment/Planning: The patient is a 44 year old female known to our service last seen in 2021 atherosclerotic disease is lost to follow-up and presents with right foot status post podiatry intervention  1) the patient is known to our service last seen in 2020 with known history of atherosclerotic disease. Patient was recommended to undergo angiogram at that time however was lost to  follow-up.  Presents now with chronic wound recently debrided by podiatry.  Recommend moving forward with angiogram tomorrow.  Right lower extremity angiogram with possible intervention was explained to the patient.  All questions answered.  Patient wished to proceed.  Discussed with Dr. Eber Hong [name] PA-C 10/05/2020 11:55 AM

## 2020-10-05 NOTE — Progress Notes (Signed)
ORTHOPAEDIC/foot and ankle surgery progress note  REQUESTING PHYSICIAN: Loletha Grayer, MD  Chief Complaint: Right foot infection  HPI: [name] is a 44 y.o. female who presents status post 1 day right partial fourth and fifth ray amputations along with wound debridements to both feet.  Patient notes no pain at this time to any of the wounds where the amputation sites.  Patient has kept dressings clean, dry, and intact.  Patient states that she really has not been up on her feet at all since the procedure.  Physical therapy has yet to see.  Past Medical History:  Diagnosis Date  . Bipolar disorder ([place])   . Chronic combined systolic and diastolic CHF (congestive heart failure) ([place])    a. ECHO 04/23/14 LV EF: 45% - 50%,  mild LVH, diffuse hypokinesis. LV diastolic dysfunction, high ventricular filling pressure. Mild LA dilation.  . Cocaine abuse ([place])   . COPD (chronic obstructive pulmonary disease) ([place])   . Diabetes mellitus without complication ([place])   . Dyspnea   . GERD (gastroesophageal reflux disease)   . Heroin overdose ([place]) 10/2018  . Hypertension   . MRSA infection    arm wound  . Nicotine dependence with current use   . Seizures ([place])    last seizure March 2021; usual 1-2 per month   Past Surgical History:  Procedure Laterality Date  . ABDOMINAL HYSTERECTOMY    . ACHILLES TENDON SURGERY Left 03/31/2020   Procedure: ACHILLES LENGTHENING;  Surgeon: Caroline More, DPM;  Location: [facility];  Service: Podiatry;  Laterality: Left;  . AMPUTATION Right 10/04/2020   Procedure: PARTIAL AMPUTATION RAY 4th and 5th;  Surgeon: Caroline More, DPM;  Location: [facility];  Service: Podiatry;  Laterality: Right;  . AMPUTATION TOE Left 03/23/2019   Procedure: AMPUTATION TOE GREAT LEFT TOE;  Surgeon: [name] Patricia, DPM;  Location: [facility];  Service: Podiatry;  Laterality: Left;  . AMPUTATION TOE Left 08/16/2019   Procedure: AMPUTATION TOE 5th;  Surgeon: Caroline More, DPM;   Location: [facility];  Service: Podiatry;  Laterality: Left;  . AMPUTATION TOE Left 02/05/2020   Procedure: AMPUTATION MPJ LEFT 4TH;  Surgeon: Caroline More, DPM;  Location: [facility];  Service: Podiatry;  Laterality: Left;  . ANKLE FRACTURE SURGERY Left    pins/screws in place  . CHOLECYSTECTOMY  2005  . FRACTURE SURGERY    . IRRIGATION AND DEBRIDEMENT FOOT Left 10/04/2020   Procedure: IRRIGATION AND DEBRIDEMENT FOOT;  Surgeon: Caroline More, DPM;  Location: [facility];  Service: Podiatry;  Laterality: Left;  . LOWER EXTREMITY ANGIOGRAPHY Left 03/12/2019   Procedure: LOWER EXTREMITY ANGIOGRAPHY;  Surgeon: Katha Cabal, MD;  Location: [facility];  Service: Cardiovascular;  Laterality: Left;  . TEE WITHOUT CARDIOVERSION N/A 03/26/2019   Procedure: TRANSESOPHAGEAL ECHOCARDIOGRAM (TEE);  Surgeon: Nelva Bush, MD;  Location: [facility];  Service: Cardiovascular;  Laterality: N/A;  . TRANSMETATARSAL AMPUTATION Left 03/31/2020   Procedure: TRANSMETATARSAL AMPUTATION;  Surgeon: Caroline More, DPM;  Location: [facility];  Service: Podiatry;  Laterality: Left;  . TUBAL LIGATION     Social History   Socioeconomic History  . Marital status: Single    Spouse name: Not on file  . Number of children: 0  . Years of education: 73  . Highest education level: Not on file  Occupational History  . Occupation: Unemployed  Tobacco Use  . Smoking status: Current Every Day Smoker    Packs/day: 1.00    Years: 28.00    Pack years:  28.00    Types: Cigarettes  . Smokeless tobacco: Never Used  Vaping Use  . Vaping Use: Never used  Substance and Sexual Activity  . Alcohol use: Not Currently    Alcohol/week: 0.0 standard drinks    Comment: Quit drinking 11/16 per pt  . Drug use: Not Currently    Comment: last use 5 years ago 2016  . Sexual activity: Yes  Other Topics Concern  . Not on file  Social History Narrative   Lives at home with mother.    Caffeine use: Soda (drinks 3L per day)    Social Determinants of Health   Financial Resource Strain: Not on file  Food Insecurity: Not on file  Transportation Needs: Not on file  Physical Activity: Not on file  Stress: Not on file  Social Connections: Not on file   Family History  Problem Relation Age of Onset  . Hypertension Other   . Drug abuse Mother   . Seizures Neg Hx    Allergies  Allergen Reactions  . Albumin (Human)     unknown  . Erythromycin Rash  . Midecamycin Rash   Prior to Admission medications   Medication Sig Start Date End Date Taking? Authorizing Provider  acetaminophen (TYLENOL) 325 MG tablet Take 2 tablets (650 mg total) by mouth every 6 (six) hours as needed for mild pain (or Fever >/= 101). 04/02/20  Yes Dhungel, Nishant, MD  amitriptyline (ELAVIL) 150 MG tablet Take 150 mg by mouth at bedtime.   Yes [provider]  atorvastatin (LIPITOR) 20 MG tablet Take 20 mg by mouth at bedtime.    Yes [provider]  bethanechol (URECHOLINE) 25 MG tablet Take 25 mg by mouth 2 (two) times daily.    Yes [provider]  furosemide (LASIX) 40 MG tablet Take 40 mg by mouth daily.    Yes [provider]  gabapentin (NEURONTIN) 600 MG tablet Take 600 mg by mouth 3 (three) times daily. 03/11/20  Yes [provider]  glipiZIDE (GLUCOTROL) 10 MG tablet Take 10 mg by mouth daily.    Yes [provider]  hydrOXYzine (ATARAX/VISTARIL) 50 MG tablet Take 50 mg by mouth 3 (three) times daily as needed for anxiety or itching.    Yes [provider]  insulin aspart (NOVOLOG) 100 UNIT/ML injection Inject 1 Units into the skin 3 (three) times daily before meals. PRN   Yes [provider]  insulin glargine (LANTUS) 100 UNIT/ML injection Inject 30 Units into the skin 2 (two) times a day.    Yes [provider]  metFORMIN (GLUCOPHAGE) 1000 MG tablet Take 1,000 mg by mouth 2 (two) times daily with a meal.   Yes [provider]  metoprolol  tartrate (LOPRESSOR) 50 MG tablet Take 25 mg by mouth 2 (two) times daily.    Yes [provider]  mirtazapine (REMERON) 15 MG tablet Take 15 mg by mouth at bedtime.  02/29/20  Yes [provider]  pantoprazole (PROTONIX) 20 MG tablet Take 20 mg by mouth daily.  08/21/18  Yes [provider]  prazosin (MINIPRESS) 5 MG capsule Take 5 mg by mouth at bedtime.   Yes [provider]  promethazine (PHENERGAN) 12.5 MG tablet Take 12.5 mg by mouth every 6 (six) hours as needed for nausea or vomiting.    Yes [provider]  QUEtiapine (SEROQUEL) 400 MG tablet Take 800 mg by mouth at bedtime.   Yes [provider]  senna-docusate (SENOKOT-S) 8.6-50 MG  tablet Take 1 tablet by mouth at bedtime as needed for mild constipation. 04/02/20  Yes Dhungel, Nishant, MD  tamsulosin (FLOMAX) 0.4 MG CAPS capsule Take 0.4 mg by mouth at bedtime.    Yes [provider]  venlafaxine XR (EFFEXOR-XR) 150 MG 24 hr capsule Take 300 mg by mouth at bedtime.    Yes [provider]  ELIQUIS 2.5 MG TABS tablet Take 1 tablet (2.5 mg total) by mouth 2 (two) times daily. Patient not taking: Reported on 09/30/2020 04/02/20   Louellen Molder, MD   DG Foot 2 Views Right  Result Date: 10/04/2020 CLINICAL DATA:  Postop check, amputation of the fourth and fifth toes. EXAM: RIGHT FOOT - 2 VIEW COMPARISON:  Right foot radiographs 09/30/2020 FINDINGS: Status post amputation of the the fourth and fifth toes to the level of the proximal metatarsal shafts. No new acute osseous abnormality in the remainder of the right foot. Soft tissues are unremarkable. IMPRESSION: Status post amputation of the fourth and fifth toes to the level of the proximal metatarsal shafts. No acute complication identified. Electronically Signed   By: Audie Pinto M.D.   On: 10/04/2020 16:34    Positive ROS: All other systems have been reviewed and were otherwise negative with the exception of those  mentioned in the HPI and as above.  12 point ROS was performed.  Physical Exam: General: Alert and oriented.  No apparent distress.  Vascular:  Left foot:Dorsalis Pedis:  diminished Posterior Tibial:  diminished  Right foot: Dorsalis Pedis:  present Posterior Tibial:  diminished  Neuro:absent  Derm:  Left foot ulcers posterior heel and medial instep not examined today as dressing was left clean, dry, and intact.  Right foot status post partial fourth and fifth ray amputations and blister debridement at the medial arch level.  Be stable at this time.  Sutures appear to be intact and skin edges appear to be well coapted, minimal erythema and edema, no drainage, no odor.     Ortho/MS: Status post left foot transmetatarsal amputation.  No pain on palpation or pain with range of motion of the left ankle today.  Status post right partial fourth and fifth ray amputations    Assessment: 1.  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  Diabetes type 2 polyneuropathy 4.  PVD 5.  History of noncompliance 6.  Drug abuse 7.  History of left transmetatarsal amputation 8.  Left heel and plantar midfoot subcutaneous neuropathic ulcerations.  Plan: -Patient seen and examined today. -Dressing Intact to the left lower extremity today.  Not examined.  Ulcers appears stable during surgical examination.  Dressings not removed or changed for the next week. -Remove dressing to right foot.  Skin incision appears to be intact with sutures intact.  No signs of obvious infection present today on examination.  Mild swelling consistent with postoperative course. -Applied Xeroform, 4 x 4's, ABD, Kerlix, Ace wrap.  Patient strictly keep dressings clean, dry, and intact for the next week until she is seen for  examination again. -X-ray imaging reviewed and discussed with patient in detail both the ankle fracture and area of osteomyelitis.  MRI also reviewed and discussed with patient in detail. -Patient appears to have an asymptomatic nonunion at this time to the right ankle she is having no pain or discomfort.  Patient to continue  usage of bone stimulator and still would recommend ambulating in boot to this area.  This will still be the recommendation postoperatively after the procedure. -Appreciate vascular recommendations.  Patient's most recent ABIs came back without signs of disease.  Plan for angiogram on Tuesday. -Stressed importance of compliance once again with patient.  Patient is at high risk for below-knee amputations on both sides due to history of noncompliance, recurrent ulcerations, infection, and uncontrolled diabetes -Appreciate infectious disease recommendations for outpatient antibiotic therapy.  Patient can likely go home on p.o. antibiotics based on MSSA cultures from the past.  Culture and path report still pending from procedure. -PT ordered, yet to see.  Patient should try to stay off of the right foot is much as possible but can use the heel for transfers.  Patient needs to be wearing surgical shoes to both feet.  Likely will have to set up for outpatient diabetic shoes with insoles if we can get this covered by insurance.  Will be difficult as patient is Medicaid.  Podiatry team to sign off at this time.  Please reconsult if any further issues arise.  Patient should follow-up in clinic within 1 week of discharge date.  Caroline More, DPM  10/05/2020 12:46 PM

## 2020-10-05 NOTE — Progress Notes (Signed)
Patient ID: [name], female   DOB: May 12, 1976, 44 y.o.   MRN: [id number] [facility] PROGRESS NOTE  [name] YOV:[id number] DOB: [birth year] DOA: 09/30/2020 PCP: [facility]  HPI/Subjective: Patient having some shaking of her legs bilaterally.  She states she does not even realize this is happening but happens all the time.  Patient came in with drainage from her right foot and pain.  Found to have osteomyelitis.  Objective: Vitals:   10/05/20 0410 10/05/20 0826  BP: 109/79 118/87  Pulse: 99 (!) 106  Resp: 17 16  Temp: 98.4 F (36.9 C) 98.8 F (37.1 C)  SpO2: 95% 97%    Intake/Output Summary (Last 24 hours) at 10/05/2020 1503 Last data filed at 10/05/2020 0700 Gross per 24 hour  Intake 557 ml  Output 1250 ml  Net -693 ml   Filed Weights   10/02/20 1259 10/04/20 0500 10/05/20 0410  Weight: 88.5 kg 95.8 kg 95.8 kg    ROS: Review of Systems  Respiratory: Negative for shortness of breath.   Cardiovascular: Negative for chest pain.  Gastrointestinal: Positive for constipation. Negative for abdominal pain, nausea and vomiting.  Musculoskeletal: Positive for joint pain.   Exam: Physical Exam HENT:     Head: Normocephalic.     Mouth/Throat:     Pharynx: No oropharyngeal exudate.  Eyes:     General: Lids are normal.     Conjunctiva/sclera: Conjunctivae normal.     Pupils: Pupils are equal, round, and reactive to light.  Cardiovascular:     Rate and Rhythm: Normal rate and regular rhythm.     Heart sounds: Normal heart sounds, S1 normal and S2 normal.  Pulmonary:     Breath sounds: No decreased breath sounds, wheezing, rhonchi or rales.  Abdominal:     Palpations: Abdomen is soft.     Tenderness: There is no abdominal tenderness.  Musculoskeletal:     Right ankle: No swelling.     Left ankle: No swelling.  Skin:    General: Skin is warm.     Findings: No rash.     Comments: Right foot covered.  Neurological:     Mental Status: She is  alert and oriented to person, place, and time.     Comments: Lower extremity jumping legs.       Data Reviewed: Basic Metabolic Panel: Recent Labs  Lab 09/30/20 1615 10/02/20 0457 10/03/20 0411 10/05/20 0809  NA 131* 137 134* 136  K 3.5 3.7 3.7 4.6  CL 93* 102 98 103  CO2 27 26 26 25   GLUCOSE 482* 97 231* 121*  BUN 16 19 23* 18  CREATININE 1.61* 1.35* 1.41* 1.27*  CALCIUM 8.8* 8.8* 9.1 8.7*   Liver Function Tests: Recent Labs  Lab 09/30/20 1615  AST 26  ALT 19  ALKPHOS 112  BILITOT 0.5  PROT 8.0  ALBUMIN 2.9*   CBC: Recent Labs  Lab 09/30/20 1534 10/01/20 1910 10/02/20 0457 10/03/20 0411 10/05/20 0836  WBC 10.3 9.9 8.6 8.2 9.4  NEUTROABS 8.8*  --   --   --   --   HGB 14.5 12.3 10.7* 11.4* 11.9*  HCT 43.0 36.9 32.7* 34.8* 36.6  MCV 84.0 85.2 85.8 86.8 85.9  PLT 372 417* 359 372 365    CBG: Recent Labs  Lab 10/04/20 1127 10/04/20 1732 10/04/20 2134 10/05/20 0828 10/05/20 1112  GLUCAP 112* 297* 173* 116* 134*    Recent Results (from the past 240 hour(s))  Surgical pcr screen  Status: Abnormal   Collection Time: 10/04/20  5:52 AM   Specimen: Nasal Mucosa; Nasal Swab  Result Value Ref Range Status   MRSA, PCR NEGATIVE NEGATIVE Final   Staphylococcus aureus POSITIVE (A) NEGATIVE Final    Comment: (NOTE) The Xpert SA Assay (FDA approved for NASAL specimens in patients 64 years of age and older), is one component of a comprehensive surveillance program. It is not intended to diagnose infection nor to guide or monitor treatment. Performed at [facility], [address] [place], [place] [address]   Aerobic/Anaerobic Culture (surgical/deep wound)     Status: None (Preliminary result)   Collection Time: 10/04/20 10:20 AM   Specimen: Wound; Body Fluid  Result Value Ref Range Status   Specimen Description   Final    WOUND Performed at [facility], [address] [place], [place] [address]    Special Requests   Final     NONE Performed at [facility], [address] [place], [place] [address]    Gram Stain NO WBC SEEN RARE GRAM POSITIVE RODS   Final   Culture   Final    CULTURE REINCUBATED FOR BETTER GROWTH Performed at [name], [place] [address], [place], [place] [address]    Report Status PENDING  Incomplete  Aerobic/Anaerobic Culture (surgical/deep wound)     Status: None (Preliminary result)   Collection Time: 10/04/20 10:23 AM   Specimen: Bone; Tissue  Result Value Ref Range Status   Specimen Description   Final    BONE Performed at [facility], [address], [place], [place] [address]    Special Requests   Final    BONE Performed at [facility], [address] [place], [place] [address]    Gram Stain   Final    RARE WBC PRESENT, PREDOMINANTLY MONONUCLEAR NO ORGANISMS SEEN    Culture   Final    NO GROWTH 1 DAY Performed at [facility], [place] [address] [place], [place] [address]    Report Status PENDING  Incomplete     Studies: DG Foot 2 Views Right  Result Date: 10/04/2020 CLINICAL DATA:  Postop check, amputation of the fourth and fifth toes. EXAM: RIGHT FOOT - 2 VIEW COMPARISON:  Right foot radiographs 09/30/2020 FINDINGS: Status post amputation of the the fourth and fifth toes to the level of the proximal metatarsal shafts. No new acute osseous abnormality in the remainder of the right foot. Soft tissues are unremarkable. IMPRESSION: Status post amputation of the fourth and fifth toes to the level of the proximal metatarsal shafts. No acute complication identified. Electronically Signed   By: Audie Pinto M.D.   On: 10/04/2020 16:34    Scheduled Meds: . amitriptyline  150 mg Oral QHS  . atorvastatin  20 mg Oral q1800  . bethanechol  25 mg Oral BID  . carvedilol  3.125 mg Oral BID WC  . Chlorhexidine Gluconate Cloth  6 each Topical Daily  . [START ON 10/06/2020] furosemide  40 mg Oral Daily  . gabapentin  600 mg Oral TID   . heparin  5,000 Units Subcutaneous Q8H  . insulin aspart  0-15 Units Subcutaneous TID WC  . insulin aspart  0-5 Units Subcutaneous QHS  . insulin glargine  25 Units Subcutaneous BID  . metroNIDAZOLE  500 mg Oral Q8H  . mirtazapine  15 mg Oral QHS  . multivitamin with minerals  1 tablet Oral Daily  . mupirocin ointment  1  application Nasal BID  . nicotine  21 mg Transdermal Daily  . pantoprazole  20 mg Oral Daily  . Ensure Max Protein  11 oz Oral Daily  . QUEtiapine  800 mg Oral QHS  . rOPINIRole  0.5 mg Oral QHS  . sodium chloride flush  10-40 mL Intracatheter Q12H  . tamsulosin  0.4 mg Oral QHS  . venlafaxine XR  300 mg Oral QHS  . vitamin B-12  1,000 mcg Oral Daily   Continuous Infusions: . sodium chloride 1,000 mL (10/02/20 0920)  . [START ON 10/06/2020] sodium chloride    .  ceFAZolin (ANCEF) IV 2 g (10/05/20 1427)    Assessment/Plan:  1. Osteomyelitis of the right fourth metatarsal.  Staff aureus growing out of culture.  Patient on Ancef and oral Flagyl.  Patient went to the operating room yesterday and amputation done by Dr. Luana Shu.  Please see operative report.  Case discussed with infectious disease specialist today and likely will go home on oral antibiotics but will keep IV antibiotics going until the time of discharge. 2. 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  Patient on glargine insulin twice a day and insulin sliding scale. 3. Chronic kidney disease stage IIIa.  Creatinine down to 1.27 today.  Since angiogram now planned for tomorrow morning, I will hold Lasix tomorrow morning. 4. Hyperlipidemia unspecified on atorvastatin. 5. Diabetic neuropathy on gabapentin.  Patient does not have much feeling in her feet. 6. Chronic systolic congestive heart failure.  No signs of heart failure currently.  On oral Lasix and Coreg.  Hold Lasix tomorrow morning 7. Stage II left heel pressure ulcer, present on admission.  This was also debrided in the  operating room yesterday. 8. Bipolar disorder.  Continue psychiatric medications 9. Restless leg syndrome.  Start a trial of Requip. 10. Peripheral vascular disease.  Vascular surgery now wants to do an angiogram tomorrow.        Code Status:     Code Status Orders  (From admission, onward)         Start     Ordered   10/01/20 0040  Full code  Continuous        10/01/20 0040        Code Status History    Date Active Date Inactive Code Status Order ID Comments User Context   03/30/2020 2051 04/02/2020 2119 Full Code [id number]  Harvie Bridge, DO Inpatient   08/15/2019 1941 08/18/2019 1431 Full Code [id number]  Lang Snow, NP Inpatient   03/21/2019 2224 03/28/2019 1915 Full Code [id number]  Lance Coon, MD Inpatient   03/12/2019 1026 03/12/2019 1623 Full Code [id number]  Katha Cabal, MD Inpatient   04/28/2014 1601 05/14/2014 1403 Full Code [id number]  Flora Lipps Inpatient   04/20/2014 1544 04/28/2014 1601 Full Code [id number]  Donita Brooks, NP ED   Advance Care Planning Activity     Family Communication: Spoke spoke with patient's mother on the phone Disposition Plan: Status is: Inpatient  Dispo: The patient is from: Home              Anticipated d/c is to: Home              Anticipated d/c date is: Since angiogram now scheduled for tomorrow may end up going home on Wednesday, 10/07/2020.              Patient currently status post amputation and on IV antibiotics for osteomyelitis.  Angiogram scheduled  for tomorrow.  Consultants:  Podiatry  Infectious disease  Vascular surgery  Procedures:  Amputations of fourth and fifth toes and metatarsals.  Antibiotics:  IV Ancef  Oral Flagyl  Time spent: 28 minutes  [place]

## 2020-10-06 ENCOUNTER — Encounter: Payer: Self-pay | Admitting: Vascular Surgery

## 2020-10-06 ENCOUNTER — Encounter
Admission: EM | Disposition: A | Discharge status: Home Health | Payer: Self-pay | Source: Home / Self Care | Attending: Internal Medicine

## 2020-10-06 DIAGNOSIS — I739 Peripheral vascular disease, unspecified: Secondary | ICD-10-CM

## 2020-10-06 DIAGNOSIS — N179 Acute kidney failure, unspecified: Secondary | ICD-10-CM

## 2020-10-06 DIAGNOSIS — I70261 Atherosclerosis of native arteries of extremities with gangrene, right leg: Secondary | ICD-10-CM

## 2020-10-06 HISTORY — PX: LOWER EXTREMITY ANGIOGRAPHY: CATH118251

## 2020-10-06 LAB — GLUCOSE, CAPILLARY
Glucose-Capillary: 97 mg/dL (ref 70–99)
Glucose-Capillary: 89 mg/dL (ref 70–99)
Glucose-Capillary: 94 mg/dL (ref 70–99)
Glucose-Capillary: 75 mg/dL (ref 70–99)
Glucose-Capillary: 93 mg/dL (ref 70–99)
Glucose-Capillary: 129 mg/dL — ABNORMAL HIGH (ref 70–99)
Glucose-Capillary: 206 mg/dL — ABNORMAL HIGH (ref 70–99)

## 2020-10-06 LAB — BASIC METABOLIC PANEL
Anion gap: 8 (ref 5–15)
BUN: 17 mg/dL (ref 6–20)
CO2: 26 mmol/L (ref 22–32)
Calcium: 8.8 mg/dL — ABNORMAL LOW (ref 8.9–10.3)
Chloride: 105 mmol/L (ref 98–111)
Creatinine, Ser: 1.24 mg/dL — ABNORMAL HIGH (ref 0.44–1.00)
GFR, Estimated: 55 mL/min — ABNORMAL LOW (ref >60)
Glucose, Bld: 86 mg/dL (ref 70–99)
Potassium: 4.2 mmol/L (ref 3.5–5.1)
Sodium: 139 mmol/L (ref 135–145)

## 2020-10-06 LAB — CBC
HCT: 36.5 % (ref 36.0–46.0)
Hemoglobin: 11.8 g/dL — ABNORMAL LOW (ref 12.0–15.0)
MCH: 28.2 pg (ref 26.0–34.0)
MCHC: 32.3 g/dL (ref 30.0–36.0)
MCV: 87.1 fL (ref 80.0–100.0)
Platelets: 359 10*3/uL (ref 150–400)
RBC: 4.19 MIL/uL (ref 3.87–5.11)
RDW: 13.8 % (ref 11.5–15.5)
WBC: 8.9 10*3/uL (ref 4.0–10.5)
nRBC: 0 % (ref 0.0–0.2)

## 2020-10-06 LAB — HEPARIN LEVEL (UNFRACTIONATED)
Heparin Unfractionated: 0.28 IU/mL — ABNORMAL LOW (ref 0.30–0.70)
Heparin Unfractionated: 0.1 IU/mL — ABNORMAL LOW (ref 0.30–0.70)

## 2020-10-06 LAB — PROTIME-INR
INR: 1.2 (ref 0.8–1.2)
Prothrombin Time: 14.3 seconds (ref 11.4–15.2)

## 2020-10-06 LAB — APTT: aPTT: 75 seconds — ABNORMAL HIGH (ref 24–36)

## 2020-10-06 LAB — SURGICAL PATHOLOGY

## 2020-10-06 SURGERY — LOWER EXTREMITY ANGIOGRAPHY
Anesthesia: Moderate Sedation | Laterality: Right

## 2020-10-06 MED ORDER — MIDAZOLAM HCL 5 MG/5ML IJ SOLN
INTRAMUSCULAR | Status: AC
  Filled 2020-10-06: qty 5

## 2020-10-06 MED ORDER — HYDROMORPHONE HCL 1 MG/ML IJ SOLN: 1.0000 mg | Freq: Once | INTRAMUSCULAR | Status: DC | PRN

## 2020-10-06 MED ORDER — SODIUM CHLORIDE 0.9% FLUSH: 3.0000 mL | INTRAVENOUS | Status: DC | PRN

## 2020-10-06 MED ORDER — ONDANSETRON HCL 4 MG/2ML IJ SOLN: 4.0000 mg | Freq: Four times a day (QID) | INTRAMUSCULAR | Status: DC | PRN

## 2020-10-06 MED ORDER — SODIUM CHLORIDE 0.9 % IV SOLN: INTRAVENOUS | Status: DC

## 2020-10-06 MED ORDER — SODIUM CHLORIDE 0.9% FLUSH
3.0000 mL | Freq: Two times a day (BID) | INTRAVENOUS | Status: DC
  Administered 2020-10-06: 3 mL via INTRAVENOUS

## 2020-10-06 MED ORDER — HEPARIN (PORCINE) 25000 UT/250ML-% IV SOLN
1050.0000 [IU]/h | INTRAVENOUS | Status: DC
  Administered 2020-10-06: 1050 [IU]/h via INTRAVENOUS
  Filled 2020-10-06: qty 250

## 2020-10-06 MED ORDER — ASPIRIN EC 325 MG PO TBEC
325.0000 mg | DELAYED_RELEASE_TABLET | ORAL | Status: AC
  Administered 2020-10-06: 325 mg via ORAL
  Filled 2020-10-06: qty 1

## 2020-10-06 MED ORDER — INSULIN GLARGINE 100 UNIT/ML ~~LOC~~ SOLN
25.0000 [IU] | Freq: Two times a day (BID) | SUBCUTANEOUS | Status: DC
  Administered 2020-10-06 – 2020-10-07 (×2): 25 [IU] via SUBCUTANEOUS
  Filled 2020-10-06 (×3): qty 0.25

## 2020-10-06 MED ORDER — ROPINIROLE HCL 1 MG PO TABS
0.5000 mg | ORAL_TABLET | Freq: Once | ORAL | Status: AC | PRN
  Administered 2020-10-06: 0.5 mg via ORAL
  Filled 2020-10-06: qty 1

## 2020-10-06 MED ORDER — FENTANYL CITRATE (PF) 100 MCG/2ML IJ SOLN
INTRAMUSCULAR | Status: AC
  Filled 2020-10-06: qty 2

## 2020-10-06 MED ORDER — MIDAZOLAM HCL 2 MG/ML PO SYRP: 8.0000 mg | ORAL_SOLUTION | Freq: Once | ORAL | Status: DC | PRN

## 2020-10-06 MED ORDER — CLINDAMYCIN PHOSPHATE 300 MG/50ML IV SOLN
300.0000 mg | Freq: Once | INTRAVENOUS | Status: DC
  Filled 2020-10-06: qty 50

## 2020-10-06 MED ORDER — CEFAZOLIN SODIUM-DEXTROSE 1-4 GM/50ML-% IV SOLN
INTRAVENOUS | Status: AC
  Filled 2020-10-06: qty 50

## 2020-10-06 MED ORDER — SODIUM CHLORIDE 0.9 % IV SOLN: 250.0000 mL | INTRAVENOUS | Status: DC | PRN

## 2020-10-06 MED ORDER — ASPIRIN EC 81 MG PO TBEC
81.0000 mg | DELAYED_RELEASE_TABLET | Freq: Every day | ORAL | Status: DC
  Administered 2020-10-07: 81 mg via ORAL
  Filled 2020-10-06: qty 1

## 2020-10-06 MED ORDER — MIDAZOLAM HCL 2 MG/2ML IJ SOLN
INTRAMUSCULAR | Status: DC | PRN
  Administered 2020-10-06: 1 mg via INTRAVENOUS
  Administered 2020-10-06: 2 mg via INTRAVENOUS

## 2020-10-06 MED ORDER — IODIXANOL 320 MG/ML IV SOLN
INTRAVENOUS | Status: DC | PRN
  Administered 2020-10-06: 60 mL

## 2020-10-06 MED ORDER — DIPHENHYDRAMINE HCL 50 MG/ML IJ SOLN: 50.0000 mg | Freq: Once | INTRAMUSCULAR | Status: DC | PRN

## 2020-10-06 MED ORDER — OXYCODONE HCL 5 MG PO TABS
5.0000 mg | ORAL_TABLET | ORAL | Status: DC | PRN
  Administered 2020-10-07: 10 mg via ORAL
  Filled 2020-10-06: qty 2

## 2020-10-06 MED ORDER — FAMOTIDINE 20 MG PO TABS: 40.0000 mg | ORAL_TABLET | Freq: Once | ORAL | Status: DC | PRN

## 2020-10-06 MED ORDER — METHYLPREDNISOLONE SODIUM SUCC 125 MG IJ SOLR: 125.0000 mg | Freq: Once | INTRAMUSCULAR | Status: DC | PRN

## 2020-10-06 MED ORDER — HEPARIN SODIUM (PORCINE) 1000 UNIT/ML IJ SOLN
INTRAMUSCULAR | Status: DC | PRN
  Administered 2020-10-06: 5000 [IU] via INTRAVENOUS

## 2020-10-06 MED ORDER — FENTANYL CITRATE (PF) 100 MCG/2ML IJ SOLN
INTRAMUSCULAR | Status: DC | PRN
  Administered 2020-10-06 (×2): 50 ug via INTRAVENOUS

## 2020-10-06 MED ORDER — HEPARIN SODIUM (PORCINE) 1000 UNIT/ML IJ SOLN
INTRAMUSCULAR | Status: AC
  Filled 2020-10-06: qty 1

## 2020-10-06 MED ORDER — CEFAZOLIN SODIUM-DEXTROSE 1-4 GM/50ML-% IV SOLN: 1.0000 g | Freq: Once | INTRAVENOUS | Status: DC

## 2020-10-06 MED ORDER — CLINDAMYCIN PHOSPHATE 300 MG/50ML IV SOLN
INTRAVENOUS | Status: AC
  Filled 2020-10-06: qty 50

## 2020-10-06 MED ORDER — NITROGLYCERIN 1 MG/10 ML FOR IR/CATH LAB
INTRA_ARTERIAL | Status: DC | PRN
  Administered 2020-10-06: 200 ug

## 2020-10-06 MED ORDER — HEPARIN BOLUS VIA INFUSION
2600.0000 [IU] | Freq: Once | INTRAVENOUS | Status: AC
  Administered 2020-10-06: 2600 [IU] via INTRAVENOUS
  Filled 2020-10-06: qty 2600

## 2020-10-06 MED ORDER — MORPHINE SULFATE (PF) 4 MG/ML IV SOLN: 2.0000 mg | INTRAVENOUS | Status: DC | PRN

## 2020-10-06 MED ORDER — FUROSEMIDE 40 MG PO TABS
40.0000 mg | ORAL_TABLET | Freq: Every day | ORAL | Status: DC
  Administered 2020-10-07: 40 mg via ORAL
  Filled 2020-10-06: qty 1

## 2020-10-06 MED ORDER — SODIUM CHLORIDE 0.9 % IV SOLN: INTRAVENOUS | Status: AC

## 2020-10-06 SURGICAL SUPPLY — 15 items
BALLN ULTRASCORE 014 3X40X150 (BALLOONS) ×2
BALLOON ULTRSCRE 014 3X40X150 (BALLOONS) IMPLANT
CATH ANGIO 5F PIGTAIL 65CM (CATHETERS) ×1 IMPLANT
CATH VERT 5FR 125CM (CATHETERS) ×1 IMPLANT
DEVICE STARCLOSE SE CLOSURE (Vascular Products) ×1 IMPLANT
GLIDEWIRE ADV .035X260CM (WIRE) ×1 IMPLANT
KIT ENCORE 26 ADVANTAGE (KITS) ×1 IMPLANT
NDL ENTRY 21GA 7CM ECHOTIP (NEEDLE) IMPLANT
NEEDLE ENTRY 21GA 7CM ECHOTIP (NEEDLE) ×2 IMPLANT
PACK ANGIOGRAPHY (CUSTOM PROCEDURE TRAY) ×2 IMPLANT
SET INTRO CAPELLA COAXIAL (SET/KITS/TRAYS/PACK) ×1 IMPLANT
SHEATH BRITE TIP 5FRX11 (SHEATH) ×1 IMPLANT
SHEATH RAABE 6FR (SHEATH) ×1 IMPLANT
WIRE GUIDERIGHT .035X150 (WIRE) ×1 IMPLANT
WIRE RUNTHROUGH .014X300CM (WIRE) ×1 IMPLANT

## 2020-10-06 NOTE — Progress Notes (Signed)
Patient ID: [name], female   DOB: 05-09-1976, 44 y.o.   MRN: [id number] [facility] PROGRESS NOTE  [name] [id number]:[id number] DOB: [birth year] DOA: 09/30/2020 PCP: [facility]  HPI/Subjective: Patient seen this morning.  Feeling okay.  Had angiogram today and placed on heparin drip afterwards.  Objective: Vitals:   10/06/20 1415 10/06/20 1430  BP: 119/86 117/84  Pulse: 96 95  Resp:    Temp:    SpO2: 95% 97%    Intake/Output Summary (Last 24 hours) at 10/06/2020 1541 Last data filed at 10/06/2020 1516 Gross per 24 hour  Intake 1998.37 ml  Output 800 ml  Net 1198.37 ml   Filed Weights   10/05/20 0410 10/06/20 0542 10/06/20 1236  Weight: 95.8 kg 95.9 kg 95.9 kg    ROS: Review of Systems  Respiratory: Negative for shortness of breath.   Cardiovascular: Negative for chest pain.  Gastrointestinal: Negative for abdominal pain, nausea and vomiting.  Musculoskeletal: Positive for joint pain.   Exam: Physical Exam HENT:     Head: Normocephalic.     Mouth/Throat:     Pharynx: No oropharyngeal exudate.  Eyes:     General: Lids are normal.     Conjunctiva/sclera: Conjunctivae normal.     Pupils: Pupils are equal, round, and reactive to light.  Cardiovascular:     Rate and Rhythm: Normal rate and regular rhythm.     Heart sounds: Normal heart sounds, S1 normal and S2 normal.  Pulmonary:     Breath sounds: Normal breath sounds. No decreased breath sounds, wheezing, rhonchi or rales.  Abdominal:     General: Abdomen is flat.     Palpations: Abdomen is soft.     Tenderness: There is no abdominal tenderness.  Musculoskeletal:     Right ankle: No swelling.     Left ankle: No swelling.  Skin:    General: Skin is warm.     Findings: No rash.     Comments: Both feet covered.  Neurological:     Mental Status: She is alert and oriented to person, place, and time.       Data Reviewed: Basic Metabolic Panel: Recent Labs  Lab 09/30/20 1615  10/02/20 0457 10/03/20 0411 10/05/20 0809 10/06/20 0407  NA 131* 137 134* 136 139  K 3.5 3.7 3.7 4.6 4.2  CL 93* 102 98 103 105  CO2 27 26 26 25 26   GLUCOSE 482* 97 231* 121* 86  BUN 16 19 23* 18 17  CREATININE 1.61* 1.35* 1.41* 1.27* 1.24*  CALCIUM 8.8* 8.8* 9.1 8.7* 8.8*   Liver Function Tests: Recent Labs  Lab 09/30/20 1615  AST 26  ALT 19  ALKPHOS 112  BILITOT 0.5  PROT 8.0  ALBUMIN 2.9*   CBC: Recent Labs  Lab 09/30/20 1534 10/01/20 1910 10/02/20 0457 10/03/20 0411 10/05/20 0836 10/06/20 1516  WBC 10.3 9.9 8.6 8.2 9.4 8.9  NEUTROABS 8.8*  --   --   --   --   --   HGB 14.5 12.3 10.7* 11.4* 11.9* 11.8*  HCT 43.0 36.9 32.7* 34.8* 36.6 36.5  MCV 84.0 85.2 85.8 86.8 85.9 87.1  PLT 372 417* 359 372 365 359    CBG: Recent Labs  Lab 10/06/20 0806 10/06/20 1211 10/06/20 1244 10/06/20 1401 10/06/20 1451  GLUCAP 97 89 94 75 93    Recent Results (from the past 240 hour(s))  Surgical pcr screen     Status: Abnormal   Collection Time: 10/04/20  5:52  AM   Specimen: Nasal Mucosa; Nasal Swab  Result Value Ref Range Status   MRSA, PCR NEGATIVE NEGATIVE Final   Staphylococcus aureus POSITIVE (A) NEGATIVE Final    Comment: (NOTE) The Xpert SA Assay (FDA approved for NASAL specimens in patients 34 years of age and older), is one component of a comprehensive surveillance program. It is not intended to diagnose infection nor to guide or monitor treatment. Performed at [facility], [address] [place], [place] [address]   Aerobic/Anaerobic Culture (surgical/deep wound)     Status: Abnormal (Preliminary result)   Collection Time: 10/04/20 10:20 AM   Specimen: Wound; Body Fluid  Result Value Ref Range Status   Specimen Description   Final    WOUND Performed at [facility], [address], [place], [place] [address]    Special Requests   Final    NONE Performed at [facility], [address] [place], [place] [address]     Gram Stain   Final    NO WBC SEEN RARE GRAM POSITIVE RODS Performed at [facility], [place] [address], [place], [place] [address]    Culture (A)  Final    MULTIPLE ORGANISMS PRESENT, NONE PREDOMINANT NO STAPHYLOCOCCUS AUREUS ISOLATED NO GROUP A STREP (S.PYOGENES) ISOLATED NO ANAEROBES ISOLATED; CULTURE IN PROGRESS FOR 5 DAYS    Report Status PENDING  Incomplete  Aerobic/Anaerobic Culture (surgical/deep wound)     Status: None (Preliminary result)   Collection Time: 10/04/20 10:23 AM   Specimen: Bone; Tissue  Result Value Ref Range Status   Specimen Description   Final    BONE Performed at [facility], [address], [place], [place] [address]    Special Requests   Final    BONE Performed at [facility], [address] [place], [place] [address]    Gram Stain   Final    RARE WBC PRESENT, PREDOMINANTLY MONONUCLEAR NO ORGANISMS SEEN    Culture   Final    CULTURE REINCUBATED FOR BETTER GROWTH Performed at [facility], [place] [address], [place],  [address]    Report Status PENDING  Incomplete  Resp Panel by RT-PCR (Flu A&B, Covid) Nasopharyngeal Swab     Status: None   Collection Time: 10/05/20  3:17 PM   Specimen: Nasopharyngeal Swab; Nasopharyngeal(NP) swabs in vial transport medium  Result Value Ref Range Status   SARS Coronavirus 2 by RT PCR NEGATIVE NEGATIVE Final    Comment: (NOTE) SARS-CoV-2 target nucleic acids are NOT DETECTED.  The SARS-CoV-2 RNA is generally detectable in upper respiratory specimens during the acute phase of infection. The lowest concentration of SARS-CoV-2 viral copies this assay can detect is 138 copies/mL. A negative result does not preclude SARS-Cov-2 infection and should not be used as the sole basis for treatment or other patient management decisions. A negative result may occur with  improper specimen collection/handling, submission of specimen other than nasopharyngeal swab, presence of viral  mutation(s) within the areas targeted by this assay, and inadequate number of viral copies(<138 copies/mL). A negative result must be combined with clinical observations, patient history, and epidemiological information. The expected result is Negative.  Fact Sheet for Patients:  EntrepreneurPulse.com.au  Fact Sheet for Healthcare Providers:  IncredibleEmployment.be  This test is no t yet approved or cleared by the [place] FDA and  has been authorized for detection and/or diagnosis of SARS-CoV-2 by FDA under an Emergency Use Authorization (EUA). This EUA will remain  in  effect (meaning this test can be used) for the duration of the COVID-19 declaration under Section 564(b)(1) of the Act, 21 U.S.C.section 360bbb-3(b)(1), unless the authorization is terminated  or revoked sooner.       Influenza A by PCR NEGATIVE NEGATIVE Final   Influenza B by PCR NEGATIVE NEGATIVE Final    Comment: (NOTE) The Xpert Xpress SARS-CoV-2/FLU/RSV plus assay is intended as an aid in the diagnosis of influenza from Nasopharyngeal swab specimens and should not be used as a sole basis for treatment. Nasal washings and aspirates are unacceptable for Xpert Xpress SARS-CoV-2/FLU/RSV testing.  Fact Sheet for Patients: EntrepreneurPulse.com.au  Fact Sheet for Healthcare Providers: IncredibleEmployment.be  This test is not yet approved or cleared by the [place] FDA and has been authorized for detection and/or diagnosis of SARS-CoV-2 by FDA under an Emergency Use Authorization (EUA). This EUA will remain in effect (meaning this test can be used) for the duration of the COVID-19 declaration under Section 564(b)(1) of the Act, 21 U.S.C. section 360bbb-3(b)(1), unless the authorization is terminated or revoked.  Performed at [facility], [address] [place], [place] [address]      Studies: PERIPHERAL  VASCULAR CATHETERIZATION  Result Date: 10/06/2020 See Op Note  DG Foot 2 Views Right  Result Date: 10/04/2020 CLINICAL DATA:  Postop check, amputation of the fourth and fifth toes. EXAM: RIGHT FOOT - 2 VIEW COMPARISON:  Right foot radiographs 09/30/2020 FINDINGS: Status post amputation of the the fourth and fifth toes to the level of the proximal metatarsal shafts. No new acute osseous abnormality in the remainder of the right foot. Soft tissues are unremarkable. IMPRESSION: Status post amputation of the fourth and fifth toes to the level of the proximal metatarsal shafts. No acute complication identified. Electronically Signed   By: Audie Pinto M.D.   On: 10/04/2020 16:34    Scheduled Meds: . amitriptyline  150 mg Oral QHS  . aspirin EC  325 mg Oral STAT  . [START ON 10/07/2020] aspirin EC  81 mg Oral Daily  . atorvastatin  20 mg Oral q1800  . bethanechol  25 mg Oral BID  . carvedilol  3.125 mg Oral BID WC  . Chlorhexidine Gluconate Cloth  6 each Topical Daily  . fentaNYL      . gabapentin  600 mg Oral TID  . heparin  2,600 Units Intravenous Once  . heparin sodium (porcine)      . insulin aspart  0-15 Units Subcutaneous TID WC  . insulin aspart  0-5 Units Subcutaneous QHS  . insulin glargine  25 Units Subcutaneous BID  . metroNIDAZOLE  500 mg Oral Q8H  . midazolam      . mirtazapine  15 mg Oral QHS  . multivitamin with minerals  1 tablet Oral Daily  . mupirocin ointment  1 application Nasal BID  . nicotine  21 mg Transdermal Daily  . pantoprazole  20 mg Oral Daily  . Ensure Max Protein  11 oz Oral Daily  . rOPINIRole  0.5 mg Oral QHS  . sodium chloride flush  10-40 mL Intracatheter Q12H  . sodium chloride flush  3 mL Intravenous Q12H  . tamsulosin  0.4 mg Oral QHS  . venlafaxine XR  300 mg Oral QHS  . vitamin B-12  1,000 mcg Oral Daily   Continuous Infusions: . sodium chloride 1,000 mL (10/02/20 0920)  . sodium chloride 75 mL/hr at 10/06/20 0058  . sodium chloride     . sodium chloride    .  ceFAZolin (ANCEF) IV 2 g (10/06/20 1016)  . heparin    . QUEtiapine      Assessment/Plan:  1. Osteomyelitis of the right fourth metatarsal.  Staff aureus growing out of culture.  Patient currently on Ancef and oral Flagyl.  Patient was taken to the operating room on 10/04/2020 for amputation of the fourth and fifth toes with metatarsals.  Infectious disease following and will continue IV antibiotics Ancef and p.o. Flagyl while here and switch over to oral antibiotics upon going home (either Augmentin or Keflex for another 7 days). 2. Peripheral vascular disease.  Angiogram done today and intervention done to the anterior right tibial artery.  Vascular surgery put on heparin drip after the procedure.  Hopefully will convert back to Eliquis prior to disposition. 3. Acute kidney injury on chronic kidney disease stage IIIa.  Creatinine down to 1.24 today.  Creatinine as high as 1.61 on presentation. 4. 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  Patient on glargine insulin twice a day but I held at this morning prior to procedure.  Continue sliding scale 5. Hyperlipidemia unspecified on atorvastatin 6. Diabetic neuropathy on gabapentin.  Patient does not have much feeling in her feet. 7. Stage II heel ulcer, present on admission.  This was also debrided in the operating room 2 days ago 8. Bipolar disorder.  Continue psychiatric medications 9. Restless leg syndrome.  Trial of Requip. 10. Chronic systolic congestive heart failure hold Lasix today restart tomorrow.  Continue Coreg.     Code Status:     Code Status Orders  (From admission, onward)         Start     Ordered   10/01/20 0040  Full code  Continuous        10/01/20 0040        Code Status History    Date Active Date Inactive Code Status Order ID Comments User Context   03/30/2020 2051 04/02/2020 2119 Full Code [id number]  Harvie Bridge, DO Inpatient   08/15/2019 1941  08/18/2019 1431 Full Code [id number]  Lang Snow, NP Inpatient   03/21/2019 2224 03/28/2019 1915 Full Code [id number]  Lance Coon, MD Inpatient   03/12/2019 1026 03/12/2019 1623 Full Code [id number]  Katha Cabal, MD Inpatient   04/28/2014 1601 05/14/2014 1403 Full Code [id number]  Flora Lipps Inpatient   04/20/2014 1544 04/28/2014 1601 Full Code [id number]  Donita Brooks, NP ED   Advance Care Planning Activity     Family Communication: Updated patient's mother on the phone Disposition Plan: Status is: Inpatient  Dispo: The patient is from: Home              Anticipated d/c is to: Home with home health              Anticipated d/c date is: Likely 10/07/2020              Patient currently at angiogram today and started on heparin drip afterwards by vascular surgery  Consultants:  Infectious disease  Vascular surgery  Podiatry  Procedures:  Fourth and fifth toe and metatarsal amputation by Dr. Luana Shu on 10/04/2020  Angiogram with angioplasty to right anterior tibial artery on 10/06/2020  Antibiotics:  IV Ancef  P.o. Flagyl  Time spent: 27 minutes  [place]

## 2020-10-06 NOTE — Progress Notes (Signed)
PT Cancellation Note  Patient Details Name: [name] MRN: [id number] DOB: 1976/10/06   Cancelled Treatment:    Reason Eval/Treat Not Completed: Patient at procedure or test/unavailable.  Pt currently off unit for procedure.  Will re-attempt PT treatment session at a later date/time.  Leitha Bleak, PT 10/06/20, 1:11 PM

## 2020-10-06 NOTE — Consult Note (Signed)
[place] for Heparin  Indication: PAD s/p tibial angioplasty   Allergies  Allergen Reactions  . Albumin (Human)     unknown  . Erythromycin Rash  . Midecamycin Rash    Patient Measurements: Height: 5\' 9"  (175.3 cm) Weight: 95.9 kg (211 lb 6.7 oz) IBW/kg (Calculated) : 66.2 Heparin Dosing Weight: 86.7 kg   Vital Signs: Temp: 98.3 F (36.8 C) (12/14 1553) Temp Source: Oral (12/14 1553) BP: 107/80 (12/14 1553) Pulse Rate: 98 (12/14 1553)  Labs: Recent Labs    10/05/20 0809 10/05/20 0836 10/06/20 0407 10/06/20 1516 10/06/20 1520  HGB  --  11.9*  --  11.8*  --   HCT  --  36.6  --  36.5  --   PLT  --  365  --  359  --   APTT  --   --   --  75*  --   LABPROT  --   --   --  14.3  --   INR  --   --   --  1.2  --   HEPARINUNFRC  --   --   --   --  0.28*  CREATININE 1.27*  --  1.24*  --   --     Estimated Creatinine Clearance: 70.6 mL/min (A) (by C-G formula based on SCr of 1.24 mg/dL (H)).   Medical History: Past Medical History:  Diagnosis Date  . Bipolar disorder ([place])   . Chronic combined systolic and diastolic CHF (congestive heart failure) ([place])    a. ECHO 04/23/14 LV EF: 45% - 50%,  mild LVH, diffuse hypokinesis. LV diastolic dysfunction, high ventricular filling pressure. Mild LA dilation.  . Cocaine abuse ([place])   . COPD (chronic obstructive pulmonary disease) ([place])   . Diabetes mellitus without complication ([place])   . Dyspnea   . GERD (gastroesophageal reflux disease)   . Heroin overdose ([place]) 10/2018  . Hypertension   . MRSA infection    arm wound  . Nicotine dependence with current use   . Seizures ([place])    last seizure March 2021; usual 1-2 per month    Medications:   Per PTA med list, patient was on Eliquis but it is documented that she is not taking.   Patient was previously on heparin 5000 units Q8H- last dose 10/05/2020 @ 1421.  Heparin 5000 units x1 given at 1332 while in  procedure  Assessment: Patient was seen by vascular earlier today and is now s/p  tibial angioplasty. Per consult, begin heparin 2 hours post sheath removal (~1400 per specials RN) and "okay for initial bolus for PAD s/p tibial angioplasty".    Goal of Therapy:  Heparin level 0.3-0.7 units/ml Monitor platelets by anticoagulation protocol: Yes   Plan:  ADDENDUM Baseline labs post-op resulted:  12/14 1520 HL 0.28; aPTT 75; INR 1.2 Agree to plan as stated below. Will initiate w/ reduced bolus; and start infusion at this time.  Heparin DW: 86.7 kg  Give 2600 units bolus (half bolus since patient had heparin bolus today at ~ 1330) x 1 Start heparin infusion at 1050 units/hr Check anti-Xa level in 6 hours and daily while on heparin, per protocol Continue to monitor H&H and platelets daily while on heparin   Shanon Brow Ivania Teagarden 10/06/2020,4:52 PM

## 2020-10-06 NOTE — Interval H&P Note (Signed)
History and Physical Interval Note:  10/06/2020 12:35 PM  [name]  has presented today for surgery, with the diagnosis of Atherosclerotic Disease With Ulceration.  The various methods of treatment have been discussed with the patient and family. After consideration of risks, benefits and other options for treatment, the patient has consented to  Procedure(s): Lower Extremity Angiography (Right) as a surgical intervention.  The patient's history has been reviewed, patient examined, no change in status, stable for surgery.  I have reviewed the patient's chart and labs.  Questions were answered to the patient's satisfaction.     [name]

## 2020-10-06 NOTE — Consult Note (Signed)
[place] for Heparin  Indication: PAD s/p tibial angioplasty   Allergies  Allergen Reactions  . Albumin (Human)     unknown  . Erythromycin Rash  . Midecamycin Rash    Patient Measurements: Height: 5\' 9"  (175.3 cm) Weight: 95.9 kg (211 lb 6.7 oz) IBW/kg (Calculated) : 66.2 Heparin Dosing Weight: 86.7 kg   Vital Signs: Temp: 99.2 F (37.3 C) (12/14 1236) Temp Source: Oral (12/14 1236) BP: 116/76 (12/14 1353) Pulse Rate: 93 (12/14 1353)  Labs: Recent Labs    10/05/20 0809 10/05/20 0836 10/06/20 0407  HGB  --  11.9*  --   HCT  --  36.6  --   PLT  --  365  --   CREATININE 1.27*  --  1.24*    Estimated Creatinine Clearance: 70.6 mL/min (A) (by C-G formula based on SCr of 1.24 mg/dL (H)).   Medical History: Past Medical History:  Diagnosis Date  . Bipolar disorder ([place])   . Chronic combined systolic and diastolic CHF (congestive heart failure) ([place])    a. ECHO 04/23/14 LV EF: 45% - 50%,  mild LVH, diffuse hypokinesis. LV diastolic dysfunction, high ventricular filling pressure. Mild LA dilation.  . Cocaine abuse ([place])   . COPD (chronic obstructive pulmonary disease) ([place])   . Diabetes mellitus without complication ([place])   . Dyspnea   . GERD (gastroesophageal reflux disease)   . Heroin overdose ([place]) 10/2018  . Hypertension   . MRSA infection    arm wound  . Nicotine dependence with current use   . Seizures ([place])    last seizure March 2021; usual 1-2 per month    Medications:   Per PTA med list, patient was on Eliquis but it is documented that she is not taking.   Patient was previously on heparin 5000 units Q8H- last dose 10/05/2020 @ 1421.  Heparin 5000 units x1 given at 1332 while in procedure  Assessment: Patient was seen by vascular earlier today and is now s/p  tibial angioplasty. Per consult, begin heparin 2 hours post sheath removal (~1400 per specials RN) and "okay for initial bolus for PAD s/p tibial  angioplasty".    Goal of Therapy:  Heparin level 0.3-0.7 units/ml Monitor platelets by anticoagulation protocol: Yes   Plan:  Baseline labs have been ordered - pending BL for heparin infusion may need to adjust below plan.  Heparin DW: 86.7 kg  Give 2600 units bolus (half bolus since patient had heparin bolus today at ~ 1330) x 1 Start heparin infusion at 1050 units/hr Check anti-Xa level in 6 hours and daily while on heparin, per protocol Continue to monitor H&H and platelets daily while on heparin   Krystyne Tewksbury R Elveta Rape 10/06/2020,1:58 PM

## 2020-10-06 NOTE — Op Note (Signed)
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  Post-operative diagnosis: Same  Procedure(s) Performed: 1. Introduction catheter into right lower extremity 3rd order catheter placement  2. Contrast injection right lower extremity for distal runoff with additional 3rd order  3. Percutaneous transluminal angioplasty to 3 mm right anterior tibial              4. Star close closure left common femoral arteriotomy   Anesthesia: Conscious sedation was administered under my direct supervision by the interventional radiology RN. IV Versed plus fentanyl were utilized. Continuous ECG, pulse oximetry and blood pressure was monitored throughout the entire procedure.  Conscious sedation was for a total of 44 minutes and 42 seconds.  Sheath: 6 [place] Raby left common femoral retrograde  Contrast: 60 cc  Fluoroscopy Time: 3.4 minutes  Indications: [name] presents with increasing pain of the right lower extremity.  She has gangrenous changes of the right forefoot with osteomyelitis identified.  This suggests the patient is having limb threatening ischemia. The risks and benefits are reviewed all questions answered patient agrees to proceed.  Procedure:[name] is a 44 y.o. y.o. female who was identified and appropriate procedural time out was performed. The patient was then placed supine on the table and prepped and draped in the usual sterile fashion.   Ultrasound was placed in the sterile sleeve and the left groin was evaluated the left common femoral artery was echolucent and pulsatile indicating patency. Image was recorded for the permanent record and under real-time  visualization a microneedle was inserted into the common femoral artery followed by the microwire and then the micro-sheath. A J-wire was then advanced through the micro-sheath and a 5 [place] sheath was then inserted over a J-wire. J-wire was then advanced and a 5 French pigtail catheter was positioned at the level of T12.  AP projection of the aorta was then obtained. Pigtail catheter was repositioned to above the bifurcation and a LAO view of the pelvis was obtained. Subsequently a pigtail catheter with the stiff angle Glidewire was used to cross the aortic bifurcation the catheter wire were advanced down into the right distal external iliac artery. Oblique view of the femoral bifurcation was then obtained and subsequently the wire was reintroduced and the pigtail catheter negotiated into the SFA representing third order catheter placement. Distal runoff was then performed.  Diagnostic interpretation: The abdominal aorta is opacified with a bolus injection contrast.  Is smooth and appearance there is no hemodynamically significant stenoses noted.  Aortic bifurcation is widely patent the bilateral common internal and external iliac arteries are all widely patent with minimal atherosclerotic changes.  The right common femoral profunda femoris superficial femoral artery and popliteal arteries are widely patent with minimal atherosclerotic changes.  The trifurcation is widely patent.  Approximately 3 cm distal to the origin of the anterior tibial there is a focal greater than 90% stenosis within the anterior tibial.  Tibioperoneal trunk is widely patent as is the posterior tibial artery.  Peroneal artery demonstrates moderate disease in its origin and is quite small and does not contribute significantly foot.  At the level of the pedal arch the anterior tibial supplies the third fourth and fifth toes the posterior tibial appears to supply the first and second toes dominantly.  Given this finding and the fact  that  the anterior tibial is the dominant runoff to the affect and part of the foot I elected to move forward with intervention  5000 units of heparin was then given and allowed to circulate and a 6 [place] Raby sheath was advanced up and over the bifurcation and positioned in the femoral artery  Vertebral catheter and an advantage Glidewire were then negotiated down into the distal popliteal. Catheter was then advanced into the anterior tibial. Hand injection contrast demonstrated the anterior tibial anatomy in detail.  3 mm x 40 mm ultra score balloon was used to angioplasty the anterior tibial.  The inflation was for 1 minute at 12 atm.  Follow-up imaging demonstrated diffuse spasm and a total of 400 mcg of intra-arterial nitroglycerin was utilized.  Follow-up imaging now demonstrated a residual focal area in the 3 mm ultra score balloon was reintroduced it was advanced across this lesion inflated to 4 atm for 30 seconds.  Follow-up imaging now demonstrated less than 5% residual stenosis with wide patency of the anterior tibial and preservation of distal runoff.   After review of these images the sheath is pulled into the left external iliac oblique of the common femoral is obtained and a Star close device deployed. There no immediate Complications.  Findings:  The abdominal aorta is opacified with a bolus injection contrast.  Is smooth and appearance there is no hemodynamically significant stenoses noted.  Aortic bifurcation is widely patent the bilateral common internal and external iliac arteries are all widely patent with minimal atherosclerotic changes.  The right common femoral profunda femoris superficial femoral artery and popliteal arteries are widely patent with minimal atherosclerotic changes.  The trifurcation is widely patent.  Approximately 3 cm distal to the origin of the anterior tibial there is a focal greater than 90% stenosis within the anterior tibial.  Tibioperoneal trunk is widely  patent as is the posterior tibial artery.  Peroneal artery demonstrates moderate disease in its origin and is quite small and does not contribute significantly foot.  At the level of the pedal arch the anterior tibial supplies the third fourth and fifth toes the posterior tibial appears to supply the first and second toes dominantly.  Given this finding and the fact that the anterior tibial is the dominant runoff to the affect and part of the foot I elected to move forward with intervention  Following angioplasty the right anterior tibial now is now widely patent and demonstrates in-line flow.  It is much improved and looks quite nice with less than 5% residual stenosis.   Summary: Successful recanalization right lower extremity for limb salvage   Disposition: Patient was taken to the recovery room in stable condition having tolerated the procedure well.  [name] 10/06/2020,1:55 PM

## 2020-10-07 LAB — GLUCOSE, CAPILLARY
Glucose-Capillary: 133 mg/dL — ABNORMAL HIGH (ref 70–99)
Glucose-Capillary: 101 mg/dL — ABNORMAL HIGH (ref 70–99)

## 2020-10-07 LAB — BASIC METABOLIC PANEL
Anion gap: 8 (ref 5–15)
BUN: 21 mg/dL — ABNORMAL HIGH (ref 6–20)
CO2: 28 mmol/L (ref 22–32)
Calcium: 9 mg/dL (ref 8.9–10.3)
Chloride: 101 mmol/L (ref 98–111)
Creatinine, Ser: 1.3 mg/dL — ABNORMAL HIGH (ref 0.44–1.00)
GFR, Estimated: 52 mL/min — ABNORMAL LOW (ref >60)
Glucose, Bld: 149 mg/dL — ABNORMAL HIGH (ref 70–99)
Potassium: 4.3 mmol/L (ref 3.5–5.1)
Sodium: 137 mmol/L (ref 135–145)

## 2020-10-07 LAB — CBC
HCT: 40 % (ref 36.0–46.0)
Hemoglobin: 12.9 g/dL (ref 12.0–15.0)
MCH: 28.2 pg (ref 26.0–34.0)
MCHC: 32.3 g/dL (ref 30.0–36.0)
MCV: 87.3 fL (ref 80.0–100.0)
Platelets: 417 10*3/uL — ABNORMAL HIGH (ref 150–400)
RBC: 4.58 MIL/uL (ref 3.87–5.11)
RDW: 13.7 % (ref 11.5–15.5)
WBC: 7.1 10*3/uL (ref 4.0–10.5)
nRBC: 0 % (ref 0.0–0.2)

## 2020-10-07 LAB — HEPARIN LEVEL (UNFRACTIONATED): Heparin Unfractionated: 0.37 IU/mL (ref 0.30–0.70)

## 2020-10-07 MED ORDER — ELIQUIS 2.5 MG PO TABS: 2.5000 mg | ORAL_TABLET | Freq: Two times a day (BID) | ORAL | 0 refills | Status: DC

## 2020-10-07 MED ORDER — HEPARIN BOLUS VIA INFUSION
2600.0000 [IU] | Freq: Once | INTRAVENOUS | Status: AC
  Administered 2020-10-07: 2600 [IU] via INTRAVENOUS
  Filled 2020-10-07: qty 2600

## 2020-10-07 MED ORDER — AMOXICILLIN-POT CLAVULANATE 875-125 MG PO TABS: 1.0000 | ORAL_TABLET | Freq: Two times a day (BID) | ORAL | 0 refills | Status: AC

## 2020-10-07 MED ORDER — APIXABAN 2.5 MG PO TABS
2.5000 mg | ORAL_TABLET | Freq: Two times a day (BID) | ORAL | Status: DC
  Administered 2020-10-07: 2.5 mg via ORAL
  Filled 2020-10-07: qty 1

## 2020-10-07 MED ORDER — HEPARIN (PORCINE) 25000 UT/250ML-% IV SOLN
1350.0000 [IU]/h | INTRAVENOUS | Status: DC
  Administered 2020-10-07: 1350 [IU]/h via INTRAVENOUS

## 2020-10-07 MED ORDER — ASPIRIN 81 MG PO TBEC: 81.0000 mg | DELAYED_RELEASE_TABLET | Freq: Every day | ORAL | 11 refills | Status: DC

## 2020-10-07 MED ORDER — "INSULIN SYRINGE 31G X 5/16"" 0.5 ML MISC": 1.0000 | Freq: Every day | 0 refills | Status: DC

## 2020-10-07 NOTE — Progress Notes (Signed)
Physical Therapy Treatment Patient Details Name: [name] MRN: [id number] DOB: 07-30-1976 Today's Date: 10/07/2020    History of Present Illness Pt is a 44 y.o. female presenting to hospital 12/8 with worsening ulceration wound of R 5th toe.  Pt admitted with osteomyelitis R foot and hyperglycemia.  Pt s/p R partial 4th and 5th ray amputation along with wound debridements to both feet 10/04/20.  Pt s/p 12/14 angiogram with angioplasty R tibial artery.  PMH includes L TMA and L tendo achilles lenthening (TAL ) 03/31/20, diabetic foot wounds, bipolar disorder, CHF, COPD, DM, htn, seizures, h/o L ankle fx surgery.    PT Comments    Therapist attempted to see pt earlier this morning but pt reporting 7-8/10 R foot pain--nurse notified regarding pt's pain and request for pain medication.  Therapist returned later in morning and pt sleeping but easily woken and agreeable to PT session (4-5/10 R foot pain during session--pt reports that is baseline pain level).  Pt educated on [place] precautions and restrictions; pt able to donn B post op/surgical shoes modified independently.  Trialed transfers (stand pivot vs stand step turn with RW vs squat pivot) with focus on safety and maintaining precautions/restrictions.  Pt reports plan to be w/c level with functional mobility at home and has ramp to enter home.  Will continue to focus on strengthening and transfers during hospital stay.    Follow Up Recommendations  Home health PT     Equipment Recommendations   (pt has needed DME at home already)    Recommendations for Other Services       Precautions / Restrictions Precautions Precautions: Fall Precaution Comments: Surgical shoes to both feet Restrictions Weight Bearing Restrictions: Yes RLE Weight Bearing: Partial weight bearing RLE Partial Weight Bearing Percentage or Pounds: heel contact (per clarification with MD Luana Shu 12/13, pt can wear surgical shoe R foot instead of surgical  boot) Other Position/Activity Restrictions: Per PT order "R ankle fracture non-union, R PWB heel contact in surgical boot - transfers for the most part only until incision heals in 2-3 weeks"    Mobility  Bed Mobility Overal bed mobility: Modified Independent             General bed mobility comments: Supine to sitting with mild increased effort; bed flat  Transfers Overall transfer level: Needs assistance Equipment used: None;Rolling walker (2 wheeled) Transfers: Chartered loss adjuster Transfers;Sit to/from Stand Sit to Stand: Min guard (Sit to/from stand with use of RW) Stand pivot transfers: Min assist;Min guard (stand pivot (1st transfer trial) with min assist bed to recliner; stand step turn with RW CGA (recliner to bed)) Squat pivot transfers: Min guard (squat pivot bed to recliner with CGA)     General transfer comment: initial vc's and visual demo for each transfer technique and WB'ing precautions  Ambulation/Gait             General Gait Details: Deferred d/t mobility restrictions   Stairs             Wheelchair Mobility    Modified Rankin (Stroke Patients Only)       Balance Overall balance assessment: Needs assistance Sitting-balance support: No upper extremity supported;Feet unsupported Sitting balance-Leahy Scale: Normal Sitting balance - Comments: steady sitting reaching outside BOS     Standing balance-Leahy Scale: Good Standing balance comment: steady standing with at least single UE support on RW  Cognition Arousal/Alertness: Awake/alert Behavior During Therapy: WFL for tasks assessed/performed Overall Cognitive Status: Within Functional Limits for tasks assessed                                        Exercises      General Comments General comments (skin integrity, edema, etc.): no drainage noted from B feet beginning/end of session.  Nursing cleared pt for  participation in physical therapy.  Pt agreeable to PT session.      Pertinent Vitals/Pain Pain Assessment: 0-10 Pain Score: 5  (pt report 4-5/10 baseline chronic pain) Pain Location: R foot surgical site Pain Descriptors / Indicators: Aching;Sore Pain Intervention(s): Limited activity within patient's tolerance;Monitored during session;Premedicated before session;Repositioned  Vitals (HR and O2 on room air) stable and WFL throughout treatment session.    Home Living                      Prior Function            PT Goals (current goals can now be found in the care plan section) Acute Rehab PT Goals Patient Stated Goal: to go home PT Goal Formulation: With patient Time For Goal Achievement: 10/19/20 Potential to Achieve Goals: Good Progress towards PT goals: Progressing toward goals    Frequency    7X/week      PT Plan Current plan remains appropriate    Co-evaluation              AM-PAC PT "6 Clicks" Mobility   Outcome Measure  Help needed turning from your back to your side while in a flat bed without using bedrails?: None Help needed moving from lying on your back to sitting on the side of a flat bed without using bedrails?: None Help needed moving to and from a bed to a chair (including a wheelchair)?: A Little Help needed standing up from a chair using your arms (e.g., wheelchair or bedside chair)?: A Little Help needed to walk in hospital room?: A Lot Help needed climbing 3-5 steps with a railing? : A Lot 6 Click Score: 18    End of Session Equipment Utilized During Treatment: Gait belt Activity Tolerance: Patient tolerated treatment well Patient left: in chair;with call bell/phone within reach;with chair alarm set;Other (comment) (B LE's elevated via pillows) Nurse Communication: Mobility status;Precautions;Weight bearing status PT Visit Diagnosis: Other abnormalities of gait and mobility (R26.89);Muscle weakness (generalized) (M62.81)      Time: 1683-7290 PT Time Calculation (min) (ACUTE ONLY): 29 min  Charges:  $Therapeutic Activity: 23-37 mins                    Leitha Bleak, PT 10/07/20, 1:34 PM

## 2020-10-07 NOTE — Consult Note (Signed)
[place] for Eliquis Indication: PAD s/p tibial angioplasty   Allergies  Allergen Reactions  . Albumin (Human)     unknown  . Erythromycin Rash  . Midecamycin Rash    Patient Measurements: Height: 5\' 9"  (175.3 cm) Weight: 95.9 kg (211 lb 6.7 oz) IBW/kg (Calculated) : 66.2 Heparin Dosing Weight: 86.7 kg   Vital Signs: Temp: 98 F (36.7 C) (12/15 0809) Temp Source: Oral (12/15 0809) BP: 122/87 (12/15 0809) Pulse Rate: 108 (12/15 0809)  Labs: Recent Labs    10/05/20 0809 10/05/20 0836 10/05/20 0836 10/06/20 0407 10/06/20 1516 10/06/20 1520 10/06/20 2251 10/07/20 0557 10/07/20 0707  HGB  --  11.9*   < >  --  11.8*  --   --  12.9  --   HCT  --  36.6  --   --  36.5  --   --  40.0  --   PLT  --  365  --   --  359  --   --  417*  --   APTT  --   --   --   --  75*  --   --   --   --   LABPROT  --   --   --   --  14.3  --   --   --   --   INR  --   --   --   --  1.2  --   --   --   --   HEPARINUNFRC  --   --   --   --   --  0.28* 0.10*  --  0.37  CREATININE 1.27*  --   --  1.24*  --   --   --  1.30*  --    < > = values in this interval not displayed.    Estimated Creatinine Clearance: 67.4 mL/min (A) (by C-G formula based on SCr of 1.3 mg/dL (H)).   Medical History: Past Medical History:  Diagnosis Date  . Bipolar disorder ([place])   . Chronic combined systolic and diastolic CHF (congestive heart failure) ([place])    a. ECHO 04/23/14 LV EF: 45% - 50%,  mild LVH, diffuse hypokinesis. LV diastolic dysfunction, high ventricular filling pressure. Mild LA dilation.  . Cocaine abuse ([place])   . COPD (chronic obstructive pulmonary disease) ([place])   . Diabetes mellitus without complication ([place])   . Dyspnea   . GERD (gastroesophageal reflux disease)   . Heroin overdose ([place]) 10/2018  . Hypertension   . MRSA infection    arm wound  . Nicotine dependence with current use   . Seizures ([place])    last seizure March 2021; usual 1-2 per month     Medications:   Per PTA med list, patient was on Eliquis but it is documented that she is not taking.   Patient was previously on heparin 5000 units Q8H- last dose 10/05/2020 @ 1421.  Heparin 5000 units x1 given at 1332 while in procedure  Assessment: Patient was seen by vascular and is now s/p  tibial angioplasty. Pharmacy has been consulted to start Eliquis. Patient currently on heparin infusion. Baseline labs (CBC/Scr) for starting Eliquis are appropriate.   Goal of Therapy:  Monitor platelets by anticoagulation protocol: Yes     Plan:   D/c heparin  Start Eliquis 2.5 mg BID now for PAD.   Will check CBC and Scr at least every 3 days, as per protocol.  Kristeen Miss, PharmD 10/07/2020 12:05 PM

## 2020-10-07 NOTE — Discharge Instructions (Signed)
Podiatry discharge instructions: 1.  Keep surgical dressings to both feet clean, dry, and intact until your postoperative appointment.  If your dressings become saturated or loosened please call our office for instruction. 2.  Continue usage of bone stimulator to the right ankle as the right ankle fracture still does not appear to be healed on x-ray imaging. 3.  Try to stay off the right foot is much as possible as the incision line needs approximately 2 to 3 weeks to heal.  Recommend if you are resting in bed or in chair using pillow boots or pillows underneath the legs to avoid pressure ulcerations in the future. 4.  Recommend taking aspirin 81 mg twice daily as well as working on knee flexion and extension exercises daily and calf massages to reduce incidence of DVT. 5.  Please quit smoking as smoking will cause your wound not to heal properly.  Also work on controlling her blood sugars further which will improve not only her wound healing but life. 6.  Please wear surgical shoes whenever ambulating.  Vascular Surgery Discharge Instructions: Please keep your groins clean and dry Gently clean the area with soap and water.  Gently pat dry.

## 2020-10-07 NOTE — Discharge Summary (Signed)
Physician Discharge Summary  [name] PQZ:[id number] DOB: Nov 02, 1975 DOA: 09/30/2020  PCP: [facility]  Admit date: 09/30/2020 Discharge date: 10/07/2020  Admitted From: home Disposition:  Home  Recommendations for Outpatient Follow-up:  1. Follow up with PCP in 1-2 weeks 2. Please obtain BMP/CBC in one week 3. Please follow up on the following pending results:  Home Health:no  Equipment/Devices: none  Discharge Condition: Stable Code Status:   Code Status: Full Code Diet recommendation:  Diet Order            Diet - low sodium heart healthy           Diet Carb Modified Fluid consistency: Thin; Room service appropriate? Yes  Diet effective now                  Brief/Interim Summary: As per admitting: 44 y.o. female with medical history significant for chronic combined systolic and diastolic CHF, insulin-dependent type 2 diabetes, osteomyelitis s/p left transmetatarsal amputation, hypertension, hyperlipidemia, CKD stage III, bipolar disorder, COPD, and substance use disorder who presents to the ED for evaluation of right foot wound. Patient follows with [facility] podiatry.  Last seen by Dr. Caryl Comes on 09/25/2020 who was concerned about worsening right foot ulcer.  And recommended for patient to come to the ED for eventual admission for antibiotics, MRI, and vascular work-up.  She has been on doxycycline as an outpatient. Patient has remained at home since then.  She has noticed some increased discharge at the ulcer between her right fourth and fifth toes and has now come to the ED for further evaluation as previously recommended.  She denies any subjective fevers, chills, diaphoresis, nausea, vomiting, chest pain, dyspnea, abdominal pain, or dysuria.  She reports occasional cough. Patient reports ongoing tobacco use, 3/4 pack per day.  She denies any recent alcohol, marijuana, cocaine, or heroin use.  ED Course: Labs show sodium 131 (140 when corrected for  hyperglycemia), potassium 3.5, bicarb 27, BUN 16, creatinine 1.61, serum glucose 482, WBC 10.3, hemoglobin 14.5, platelets 372,000.Right foot x-ray shows osteomyelitis of the right fourth digit metatarsal and proximal phalanx.  Associated subluxation of the fourth MTP joint noted.Patient was given 1 L normal saline, 8 units IV NovoLog, IV Zosyn.  The hospitalist service was consulted to admit for further evaluation management Patient was admitted, seen by vascular and podiatry. Treated with IV antibiotics, wound culture grew staph aureus, has been on Ancef and Flagyl, underwent ORIF 12/12 with impaction of the fourth and fifth toes with metatarsals. Patient had angiogram done and with intervention and placed on heparin post procedure and plan was to switch to Eliquis for discharge along with aspirin and statin as per vascular. Wound care provided and patient to follow-up with wound center Discussed with podiatry and vascular okay for discharge this morning.  Patient go home with 7 days of Augmentin.  Discharge Diagnoses:   Osteomyelitis of the right fourth metatarsal.  Staff aureus growing out of culture.  Patient currently on Ancef and oral Flagyl.  Patient was taken to the operating room on 10/04/2020 for amputation of the fourth and fifth toes with metatarsals.  Infectious disease following and will continue IV antibiotics Ancef and p.o. Flagyl while here and switch over to oral antibiotics upon going home (either Augmentin or Keflex for another 7 days)  PVD: S/P angiogram 12/14 and intervention done to the anterior right tibial artery.  Will go home on Eliquis from heparin.  Also aspirin and statin was started.  Disposition.  Acute kidney injury on chronic kidney disease stage IIIa.  Creatinine at 1.3 stable. Recent Labs  Lab 10/02/20 0457 10/03/20 0411 10/05/20 0809 10/06/20 0407 10/07/20 0557  BUN 19 23* 18 17 21*  CREATININE 1.35* 1.41* 1.27* 1.24* 1.30*   Type 2 diabetes mellitus with  chronic kidney disease stage IIIa.  Patient resume her home meds oral and insulin, insulin syringe prescription provided Recent Labs  Lab 10/06/20 1451 10/06/20 1554 10/06/20 2120 10/07/20 0810 10/07/20 1146  GLUCAP 93 129* 206* 133* 101*  1.  Diabetic neuropathy on gabapentin.  Patient does not have much feeling in her feet. Stage II heel ulcer, present on admission.  This was also debrided in the operating room 2 days ago Bipolar disorder.  Continue psychiatric medications Restless leg syndrome. stable. Chronic systolic congestive heart failure cont lasix, metoprolol  Consults:  Vascular, podiatry  Subjective: Alert awake oriented resting comfortably pain is controlled.  Adamant on leaving  Discharge Exam: Vitals:   10/07/20 0549 10/07/20 0809  BP: (!) 141/97 122/87  Pulse: (!) 106 (!) 108  Resp:  18  Temp: 98.3 F (36.8 C) 98 F (36.7 C)  SpO2: 99% 94%   General: Pt is alert, awake, not in acute distress Cardiovascular: RRR, S1/S2 +, no rubs, no gallops Respiratory: CTA bilaterally, no wheezing, no rhonchi Abdominal: Soft, NT, ND, bowel sounds + Extremities: no edema, no cyanosis  Discharge Instructions  Discharge Instructions    Diet - low sodium heart healthy   Complete by: As directed    Discharge instructions   Complete by: As directed    Follow-up with vascular and podiatry soon as instructed.  Please call call MD or return to ER for similar or worsening recurring problem that brought you to hospital or if any fever,nausea/vomiting,abdominal pain, uncontrolled pain, chest pain,  shortness of breath or any other alarming symptoms.  Please follow-up your doctor as instructed in a week time and call the office for appointment.  Please avoid alcohol, smoking, or any other illicit substance and maintain healthy habits including taking your regular medications as prescribed.  You were cared for by a hospitalist during your hospital stay. If you have any  questions about your discharge medications or the care you received while you were in the hospital after you are discharged, you can call the unit and ask to speak with the hospitalist on call if the hospitalist that took care of you is not available.  Once you are discharged, your primary care physician will handle any further medical issues. Please note that NO REFILLS for any discharge medications will be authorized once you are discharged, as it is imperative that you return to your primary care physician (or establish a relationship with a primary care physician if you do not have one) for your aftercare needs so that they can reassess your need for medications and monitor your lab values   Discharge wound care:   Complete by: As directed    Dressing care as instructed by podiatry/vascular   Increase activity slowly   Complete by: As directed      Allergies as of 10/07/2020      Reactions   Albumin (human)    unknown   Erythromycin Rash   Midecamycin Rash      Medication List    TAKE these medications   acetaminophen 325 MG tablet Commonly known as: TYLENOL Take 2 tablets (650 mg total) by mouth every 6 (six) hours as needed for mild pain (  or Fever >/= 101).   amitriptyline 150 MG tablet Commonly known as: ELAVIL Take 150 mg by mouth at bedtime.   amoxicillin-clavulanate 875-125 MG tablet Commonly known as: Augmentin Take 1 tablet by mouth 2 (two) times daily for 7 days.   aspirin 81 MG EC tablet Take 1 tablet (81 mg total) by mouth daily. Swallow whole. Start taking on: October 08, 2020   atorvastatin 20 MG tablet Commonly known as: LIPITOR Take 20 mg by mouth at bedtime.   bethanechol 25 MG tablet Commonly known as: URECHOLINE Take 25 mg by mouth 2 (two) times daily.   Eliquis 2.5 MG Tabs tablet Generic drug: apixaban Take 1 tablet (2.5 mg total) by mouth 2 (two) times daily.   furosemide 40 MG tablet Commonly known as: LASIX Take 40 mg by mouth daily.    gabapentin 600 MG tablet Commonly known as: NEURONTIN Take 600 mg by mouth 3 (three) times daily.   glipiZIDE 10 MG tablet Commonly known as: GLUCOTROL Take 10 mg by mouth daily.   hydrOXYzine 50 MG tablet Commonly known as: ATARAX/VISTARIL Take 50 mg by mouth 3 (three) times daily as needed for anxiety or itching.   insulin aspart 100 UNIT/ML injection Commonly known as: novoLOG Inject 1 Units into the skin 3 (three) times daily before meals. PRN   insulin glargine 100 UNIT/ML injection Commonly known as: LANTUS Inject 30 Units into the skin 2 (two) times a day.   INSULIN SYRINGE .5CC/31GX5/16" 31G X 5/16" 0.5 ML Misc 1 each by Does not apply route daily at 6 (six) AM.   metFORMIN 1000 MG tablet Commonly known as: GLUCOPHAGE Take 1,000 mg by mouth 2 (two) times daily with a meal.   metoprolol tartrate 50 MG tablet Commonly known as: LOPRESSOR Take 25 mg by mouth 2 (two) times daily.   mirtazapine 15 MG tablet Commonly known as: REMERON Take 15 mg by mouth at bedtime.   pantoprazole 20 MG tablet Commonly known as: PROTONIX Take 20 mg by mouth daily.   prazosin 5 MG capsule Commonly known as: MINIPRESS Take 5 mg by mouth at bedtime.   promethazine 12.5 MG tablet Commonly known as: PHENERGAN Take 12.5 mg by mouth every 6 (six) hours as needed for nausea or vomiting.   QUEtiapine 400 MG tablet Commonly known as: SEROQUEL Take 800 mg by mouth at bedtime.   senna-docusate 8.6-50 MG tablet Commonly known as: Senokot-S Take 1 tablet by mouth at bedtime as needed for mild constipation.   tamsulosin 0.4 MG Caps capsule Commonly known as: FLOMAX Take 0.4 mg by mouth at bedtime.   venlafaxine XR 150 MG 24 hr capsule Commonly known as: EFFEXOR-XR Take 300 mg by mouth at bedtime.            Discharge Care Instructions  (From admission, onward)         Start     Ordered   10/07/20 0000  Discharge wound care:       Comments: Dressing care as instructed by  podiatry/vascular   10/07/20 1249          Follow-up Information    Caroline More, DPM. Schedule an appointment as soon as possible for a visit in 1 week(s).   Specialty: Podiatry Contact information: [place] [address] [phone]        Delana Meyer, Dolores Lory, MD Follow up in 1 month(s).   Specialties: Vascular Surgery, Cardiology, Radiology, Vascular Surgery Why: Can see Schnier or Arna Medici. Will need  ABI with visit.  Contact information: [place] [address] [phone]        [facility] Follow up in 1 week(s).   Specialty: Family Medicine Contact information: [place] [address] [phone]              Allergies  Allergen Reactions  . Albumin (Human)     unknown  . Erythromycin Rash  . Midecamycin Rash    The results of significant diagnostics from this hospitalization (including imaging, microbiology, ancillary and laboratory) are listed below for reference.    Microbiology: Recent Results (from the past 240 hour(s))  Surgical pcr screen     Status: Abnormal   Collection Time: 10/04/20  5:52 AM   Specimen: Nasal Mucosa; Nasal Swab  Result Value Ref Range Status   MRSA, PCR NEGATIVE NEGATIVE Final   Staphylococcus aureus POSITIVE (A) NEGATIVE Final    Comment: (NOTE) The Xpert SA Assay (FDA approved for NASAL specimens in patients 21 years of age and older), is one component of a comprehensive surveillance program. It is not intended to diagnose infection nor to guide or monitor treatment. Performed at [facility], [address] [place], [place] [address]   Aerobic/Anaerobic Culture (surgical/deep wound)     Status: Abnormal (Preliminary result)   Collection Time: 10/04/20 10:20 AM   Specimen: Wound; Body Fluid  Result Value Ref Range Status   Specimen Description   Final    WOUND Performed at [facility], [address], [place], [place]  [address]    Special Requests   Final    NONE Performed at [facility], [address] [place], [place] [address]    Gram Stain   Final    NO WBC SEEN RARE GRAM POSITIVE RODS Performed at [facility], [place] [address] [place], [place] [address]    Culture (A)  Final    MULTIPLE ORGANISMS PRESENT, NONE PREDOMINANT NO STAPHYLOCOCCUS AUREUS ISOLATED NO GROUP A STREP (S.PYOGENES) ISOLATED NO ANAEROBES ISOLATED; CULTURE IN PROGRESS FOR 5 DAYS    Report Status PENDING  Incomplete  Aerobic/Anaerobic Culture (surgical/deep wound)     Status: None (Preliminary result)   Collection Time: 10/04/20 10:23 AM   Specimen: Bone; Tissue  Result Value Ref Range Status   Specimen Description   Final    BONE Performed at [facility], [address], [place], [place] [address]    Special Requests   Final    BONE Performed at [facility], [address] [place], [place] [address]    Gram Stain   Final    RARE WBC PRESENT, PREDOMINANTLY MONONUCLEAR NO ORGANISMS SEEN    Culture   Final    FEW LACTOBACILLUS SPECIES Standardized susceptibility testing for this organism is not available. Performed at [facility], [place] [address], [place], [place] [address]    Report Status PENDING  Incomplete  Resp Panel by RT-PCR (Flu A&B, Covid) Nasopharyngeal Swab     Status: None   Collection Time: 10/05/20  3:17 PM   Specimen: Nasopharyngeal Swab; Nasopharyngeal(NP) swabs in vial transport medium  Result Value Ref Range Status   SARS Coronavirus 2 by RT PCR NEGATIVE NEGATIVE Final    Comment: (NOTE) SARS-CoV-2 target nucleic acids are NOT DETECTED.  The SARS-CoV-2 RNA is generally detectable in upper respiratory specimens during the acute phase of infection. The lowest concentration of SARS-CoV-2 viral copies this assay can detect is 138  copies/mL. A negative result does not preclude SARS-Cov-2 infection and should not be used as the sole basis for treatment  or other patient management decisions. A negative result may occur with  improper specimen collection/handling, submission of specimen other than nasopharyngeal swab, presence of viral mutation(s) within the areas targeted by this assay, and inadequate number of viral copies(<138 copies/mL). A negative result must be combined with clinical observations, patient history, and epidemiological information. The expected result is Negative.  Fact Sheet for Patients:  EntrepreneurPulse.com.au  Fact Sheet for Healthcare Providers:  IncredibleEmployment.be  This test is no t yet approved or cleared by the [place] FDA and  has been authorized for detection and/or diagnosis of SARS-CoV-2 by FDA under an Emergency Use Authorization (EUA). This EUA will remain  in effect (meaning this test can be used) for the duration of the COVID-19 declaration under Section 564(b)(1) of the Act, 21 U.S.C.section 360bbb-3(b)(1), unless the authorization is terminated  or revoked sooner.       Influenza A by PCR NEGATIVE NEGATIVE Final   Influenza B by PCR NEGATIVE NEGATIVE Final    Comment: (NOTE) The Xpert Xpress SARS-CoV-2/FLU/RSV plus assay is intended as an aid in the diagnosis of influenza from Nasopharyngeal swab specimens and should not be used as a sole basis for treatment. Nasal washings and aspirates are unacceptable for Xpert Xpress SARS-CoV-2/FLU/RSV testing.  Fact Sheet for Patients: EntrepreneurPulse.com.au  Fact Sheet for Healthcare Providers: IncredibleEmployment.be  This test is not yet approved or cleared by the [place] FDA and has been authorized for detection and/or diagnosis of SARS-CoV-2 by FDA under an Emergency Use Authorization (EUA). This EUA will remain in effect (meaning this test can be used) for the duration of the COVID-19 declaration under Section 564(b)(1) of the Act, 21 U.S.C. section  360bbb-3(b)(1), unless the authorization is terminated or revoked.  Performed at [facility], [address] [place], [place] [address]     Procedures/Studies: [place] Ankle Complete Right  Result Date: 10/01/2020 CLINICAL DATA:  Ulcer between the fourth and fifth toes. EXAM: RIGHT ANKLE - COMPLETE 3+ VIEW COMPARISON:  Foot radiographs from yesterday and MRI from today. CT ankle 07/20/2020 FINDINGS: Remote healing distal fibula medial malleolar fractures. The ankle mortise is maintained. Mild ankle joint degenerative changes and remote healed osteochondral lesion or bone infarct. No definite ankle joint effusion. The mid and hindfoot bony structures are intact. No destructive changes. There is a pathologic fracture/dislocation involving the fourth toe. IMPRESSION: 1. Healing distal fibula and medial malleolar fractures. 2. Pathologic fracture/dislocation at the fourth MTP joint. Electronically Signed   By: Marijo Sanes M.D.   On: 10/01/2020 08:36   MR FOOT RIGHT WO CONTRAST  Result Date: 10/01/2020 CLINICAL DATA:  Worsening ulceration of the fifth digit EXAM: MRI OF THE RIGHT FOREFOOT WITHOUT CONTRAST TECHNIQUE: Multiplanar, multisequence MR imaging of the right was performed. No intravenous contrast was administered. COMPARISON:  Radiograph same day FINDINGS: Bones/Joint/Cartilage There is cortical destruction with surrounding periosteal reaction with T2 bright/T1 hypointense signal within the fourth metatarsal head. There is also a T2 hyperintense signal with T1 hypointensity and irregularity at the MTP joint surrounding the proximal fourth phalanx. T2 hyperintense signal seen throughout the fifth proximal phalanx and fifth metatarsal head without T1 hypointensity. Ligaments The Lisfranc ligaments are intact. Muscles and Tendons Diffusely increased signal with atrophy seen within the musculature. The flexor and extensor tendons are grossly intact. Soft tissues There is a area of ulceration  seen along the dorsum  of the foot between the fourth and fifth metatarsal heads. There is surrounding skin thickening and diffuse subcutaneous edema. No loculated fluid collections however are noted. IMPRESSION: Area of ulceration on the dorsum of the foot above the fourth metatarsal head with findings consistent with acute osteomyelitis involving the fourth metatarsal head and proximal phalanx. No subcutaneous abscess. Probable reactive marrow of the fifth metatarsal head and proximal phalanx. Electronically Signed   By: Prudencio Pair M.D.   On: 10/01/2020 03:08   PERIPHERAL VASCULAR CATHETERIZATION  Result Date: 10/06/2020 See Op Note  US ARTERIAL ABI (SCREENING LOWER EXTREMITY)  Result Date: 10/02/2020 CLINICAL DATA:  Scheduled for right toe amputations. History of left toe amputations. EXAM: NONINVASIVE PHYSIOLOGIC VASCULAR STUDY OF BILATERAL LOWER EXTREMITIES TECHNIQUE: Evaluation of both lower extremities were performed at rest, including calculation of ankle-brachial indices with single level Doppler, pressure and pulse volume recording. COMPARISON:  None. FINDINGS: Right ABI:  1.26 Left ABI:  1.11 Right Lower Extremity:  Normal arterial waveforms at the ankle. Left Lower Extremity:  Normal arterial waveforms at the ankle. 1.0-1.4 Normal IMPRESSION: Normal ankle-brachial indices bilaterally. No evidence for occlusive arterial disease in lower extremities. Electronically Signed   By: Markus Daft M.D.   On: 10/02/2020 08:01   DG Foot 2 Views Right  Result Date: 10/04/2020 CLINICAL DATA:  Postop check, amputation of the fourth and fifth toes. EXAM: RIGHT FOOT - 2 VIEW COMPARISON:  Right foot radiographs 09/30/2020 FINDINGS: Status post amputation of the the fourth and fifth toes to the level of the proximal metatarsal shafts. No new acute osseous abnormality in the remainder of the right foot. Soft tissues are unremarkable. IMPRESSION: Status post amputation of the fourth and fifth toes to the level  of the proximal metatarsal shafts. No acute complication identified. Electronically Signed   By: Audie Pinto M.D.   On: 10/04/2020 16:34   DG Foot Complete Right  Result Date: 09/30/2020 CLINICAL DATA:  Right fifth toe infection, evaluate for osteomyelitis. History of diabetes. Foot ulcer the twin fourth and fifth toes. EXAM: RIGHT FOOT COMPLETE - 3+ VIEW COMPARISON:  X-ray right foot 03/19/2020. FINDINGS: Lucency and cortical destruction of the base and proximal body of the fourth proximal phalanx. Lucency and cortical destruction of the head of the fourth metatarsal. Associated subluxation at the fourth metatarsophalangeal joint. Subcutaneus soft tissue edema. Known ulceration not visualized on this study. Remainder of the bones of the right foot demonstrate no acute displaced fracture or dislocation. No other regions of cortical erosion or destruction. Similar-appearing cortical irregularity along the neck lateral aspect of the fifth metatarsal. Chronic medial malleolar fracture. IMPRESSION: Osteomyelitis of the right fourth digit metatarsal and proximal phalanx. Associated subluxation of the fourth metatarsophalangeal joint. Electronically Signed   By: Iven Finn M.D.   On: 09/30/2020 23:23    Labs: BNP (last 3 results) No results for input(s): BNP in the last 8760 hours. Basic Metabolic Panel: Recent Labs  Lab 10/02/20 0457 10/03/20 0411 10/05/20 0809 10/06/20 0407 10/07/20 0557  NA 137 134* 136 139 137  K 3.7 3.7 4.6 4.2 4.3  CL 102 98 103 105 101  CO2 26 26 25 26 28   GLUCOSE 97 231* 121* 86 149*  BUN 19 23* 18 17 21*  CREATININE 1.35* 1.41* 1.27* 1.24* 1.30*  CALCIUM 8.8* 9.1 8.7* 8.8* 9.0   Liver Function Tests: Recent Labs  Lab 09/30/20 1615  AST 26  ALT 19  ALKPHOS 112  BILITOT 0.5  PROT 8.0  ALBUMIN  2.9*   No results for input(s): LIPASE, AMYLASE in the last 168 hours. No results for input(s): AMMONIA in the last 168 hours. CBC: Recent Labs  Lab  09/30/20 1534 10/01/20 1910 10/02/20 0457 10/03/20 0411 10/05/20 0836 10/06/20 1516 10/07/20 0557  WBC 10.3   < > 8.6 8.2 9.4 8.9 7.1  NEUTROABS 8.8*  --   --   --   --   --   --   HGB 14.5   < > 10.7* 11.4* 11.9* 11.8* 12.9  HCT 43.0   < > 32.7* 34.8* 36.6 36.5 40.0  MCV 84.0   < > 85.8 86.8 85.9 87.1 87.3  PLT 372   < > 359 372 365 359 417*   < > = values in this interval not displayed.   Cardiac Enzymes: No results for input(s): CKTOTAL, CKMB, CKMBINDEX, TROPONINI in the last 168 hours. BNP: Invalid input(s): POCBNP CBG: Recent Labs  Lab 10/06/20 1451 10/06/20 1554 10/06/20 2120 10/07/20 0810 10/07/20 1146  GLUCAP 93 129* 206* 133* 101*   D-Dimer No results for input(s): DDIMER in the last 72 hours. Hgb A1c No results for input(s): HGBA1C in the last 72 hours. Lipid Profile No results for input(s): CHOL, HDL, LDLCALC, TRIG, CHOLHDL, LDLDIRECT in the last 72 hours. Thyroid function studies No results for input(s): TSH, T4TOTAL, T3FREE, THYROIDAB in the last 72 hours.  Invalid input(s): FREET3 Anemia work up No results for input(s): VITAMINB12, FOLATE, FERRITIN, TIBC, IRON, RETICCTPCT in the last 72 hours. Urinalysis    Component Value Date/Time   COLORURINE YELLOW (A) 08/15/2019 1931   APPEARANCEUR CLEAR (A) 08/15/2019 1931   LABSPEC 1.015 08/15/2019 1931   PHURINE 5.0 08/15/2019 1931   GLUCOSEU >=500 (A) 08/15/2019 1931   HGBUR NEGATIVE 08/15/2019 1931   BILIRUBINUR NEGATIVE 08/15/2019 1931   KETONESUR NEGATIVE 08/15/2019 1931   PROTEINUR NEGATIVE 08/15/2019 1931   UROBILINOGEN 0.2 04/29/2014 0001   NITRITE NEGATIVE 08/15/2019 1931   LEUKOCYTESUR TRACE (A) 08/15/2019 1931   Sepsis Labs Invalid input(s): PROCALCITONIN,  WBC,  LACTICIDVEN Microbiology Recent Results (from the past 240 hour(s))  Surgical pcr screen     Status: Abnormal   Collection Time: 10/04/20  5:52 AM   Specimen: Nasal Mucosa; Nasal Swab  Result Value Ref Range Status   MRSA, PCR  NEGATIVE NEGATIVE Final   Staphylococcus aureus POSITIVE (A) NEGATIVE Final    Comment: (NOTE) The Xpert SA Assay (FDA approved for NASAL specimens in patients 51 years of age and older), is one component of a comprehensive surveillance program. It is not intended to diagnose infection nor to guide or monitor treatment. Performed at [facility], [address] [place], [place] [address]   Aerobic/Anaerobic Culture (surgical/deep wound)     Status: Abnormal (Preliminary result)   Collection Time: 10/04/20 10:20 AM   Specimen: Wound; Body Fluid  Result Value Ref Range Status   Specimen Description   Final    WOUND Performed at [facility], [address] [place], [place] [address]    Special Requests   Final    NONE Performed at [facility], [address] [place], [place] [address]    Gram Stain   Final    NO WBC SEEN RARE GRAM POSITIVE RODS Performed at [facility], [place] [address], [place],  [address]    Culture (A)  Final    MULTIPLE ORGANISMS PRESENT, NONE PREDOMINANT NO STAPHYLOCOCCUS AUREUS ISOLATED NO GROUP A STREP (S.PYOGENES) ISOLATED NO  ANAEROBES ISOLATED; CULTURE IN PROGRESS FOR 5 DAYS    Report Status PENDING  Incomplete  Aerobic/Anaerobic Culture (surgical/deep wound)     Status: None (Preliminary result)   Collection Time: 10/04/20 10:23 AM   Specimen: Bone; Tissue  Result Value Ref Range Status   Specimen Description   Final    BONE Performed at [facility], [address] [place], [place] [address]    Special Requests   Final    BONE Performed at [facility], [address] [place], [place] [address]    Gram Stain   Final    RARE WBC PRESENT, PREDOMINANTLY MONONUCLEAR NO ORGANISMS SEEN    Culture   Final    FEW LACTOBACILLUS SPECIES Standardized susceptibility testing for this organism is not available. Performed at [facility], [place] [address], [place], [place]  [address]    Report Status PENDING  Incomplete  Resp Panel by RT-PCR (Flu A&B, Covid) Nasopharyngeal Swab     Status: None   Collection Time: 10/05/20  3:17 PM   Specimen: Nasopharyngeal Swab; Nasopharyngeal(NP) swabs in vial transport medium  Result Value Ref Range Status   SARS Coronavirus 2 by RT PCR NEGATIVE NEGATIVE Final    Comment: (NOTE) SARS-CoV-2 target nucleic acids are NOT DETECTED.  The SARS-CoV-2 RNA is generally detectable in upper respiratory specimens during the acute phase of infection. The lowest concentration of SARS-CoV-2 viral copies this assay can detect is 138 copies/mL. A negative result does not preclude SARS-Cov-2 infection and should not be used as the sole basis for treatment or other patient management decisions. A negative result may occur with  improper specimen collection/handling, submission of specimen other than nasopharyngeal swab, presence of viral mutation(s) within the areas targeted by this assay, and inadequate number of viral copies(<138 copies/mL). A negative result must be combined with clinical observations, patient history, and epidemiological information. The expected result is Negative.  Fact Sheet for Patients:  EntrepreneurPulse.com.au  Fact Sheet for Healthcare Providers:  IncredibleEmployment.be  This test is no t yet approved or cleared by the [place] FDA and  has been authorized for detection and/or diagnosis of SARS-CoV-2 by FDA under an Emergency Use Authorization (EUA). This EUA will remain  in effect (meaning this test can be used) for the duration of the COVID-19 declaration under Section 564(b)(1) of the Act, 21 U.S.C.section 360bbb-3(b)(1), unless the authorization is terminated  or revoked sooner.       Influenza A by PCR NEGATIVE NEGATIVE Final   Influenza B by PCR NEGATIVE NEGATIVE Final    Comment: (NOTE) The Xpert Xpress SARS-CoV-2/FLU/RSV plus assay is intended as an  aid in the diagnosis of influenza from Nasopharyngeal swab specimens and should not be used as a sole basis for treatment. Nasal washings and aspirates are unacceptable for Xpert Xpress SARS-CoV-2/FLU/RSV testing.  Fact Sheet for Patients: EntrepreneurPulse.com.au  Fact Sheet for Healthcare Providers: IncredibleEmployment.be  This test is not yet approved or cleared by the [place] FDA and has been authorized for detection and/or diagnosis of SARS-CoV-2 by FDA under an Emergency Use Authorization (EUA). This EUA will remain in effect (meaning this test can be used) for the duration of the COVID-19 declaration under Section 564(b)(1) of the Act, 21 U.S.C. section 360bbb-3(b)(1), unless the authorization is terminated or revoked.  Performed at [facility], [address], [place], [place] [address]      Time coordinating discharge: 25  minutes  SIGNED: Antonieta Pert, MD  [facility]  10/07/2020, 12:49 PM  If 7PM-7AM, please contact night-coverage [email or web address]

## 2020-10-07 NOTE — Consult Note (Signed)
[place] for Heparin  Indication: PAD s/p tibial angioplasty   Allergies  Allergen Reactions  . Albumin (Human)     unknown  . Erythromycin Rash  . Midecamycin Rash    Patient Measurements: Height: 5\' 9"  (175.3 cm) Weight: 95.9 kg (211 lb 6.7 oz) IBW/kg (Calculated) : 66.2 Heparin Dosing Weight: 86.7 kg   Vital Signs: Temp: 98 F (36.7 C) (12/15 0809) Temp Source: Oral (12/15 0809) BP: 122/87 (12/15 0809) Pulse Rate: 108 (12/15 0809)  Labs: Recent Labs    10/05/20 0809 10/05/20 0836 10/05/20 0836 10/06/20 0407 10/06/20 1516 10/06/20 1520 10/06/20 2251 10/07/20 0557 10/07/20 0707  HGB  --  11.9*   < >  --  11.8*  --   --  12.9  --   HCT  --  36.6  --   --  36.5  --   --  40.0  --   PLT  --  365  --   --  359  --   --  417*  --   APTT  --   --   --   --  75*  --   --   --   --   LABPROT  --   --   --   --  14.3  --   --   --   --   INR  --   --   --   --  1.2  --   --   --   --   HEPARINUNFRC  --   --   --   --   --  0.28* 0.10*  --  0.37  CREATININE 1.27*  --   --  1.24*  --   --   --  1.30*  --    < > = values in this interval not displayed.    Estimated Creatinine Clearance: 67.4 mL/min (A) (by C-G formula based on SCr of 1.3 mg/dL (H)).   Medical History: Past Medical History:  Diagnosis Date  . Bipolar disorder ([place])   . Chronic combined systolic and diastolic CHF (congestive heart failure) ([place])    a. ECHO 04/23/14 LV EF: 45% - 50%,  mild LVH, diffuse hypokinesis. LV diastolic dysfunction, high ventricular filling pressure. Mild LA dilation.  . Cocaine abuse ([place])   . COPD (chronic obstructive pulmonary disease) ([place])   . Diabetes mellitus without complication ([place])   . Dyspnea   . GERD (gastroesophageal reflux disease)   . Heroin overdose ([place]) 10/2018  . Hypertension   . MRSA infection    arm wound  . Nicotine dependence with current use   . Seizures ([place])    last seizure March 2021; usual 1-2 per month     Medications:   Per PTA med list, patient was on Eliquis but it is documented that she is not taking.   Patient was previously on heparin 5000 units Q8H- last dose 10/05/2020 @ 1421.  Heparin 5000 units x1 given at 1332 while in procedure  Assessment: Patient was seen by vascular earlier today and is now s/p  tibial angioplasty. Per consult, begin heparin 2 hours post sheath removal (~1400 per specials RN) and "okay for initial bolus for PAD s/p tibial angioplasty".    Goal of Therapy:  Heparin level 0.3-0.7 units/ml Monitor platelets by anticoagulation protocol: Yes   12/14 1520 HL 0.28; aPTT 75; INR 1.2 12/14 2251 HL 0.10 12/15 0707 HL 0.37, therapeutic x1  Plan:  Heparin DW: 86.7 kg  Continue heparin infusion of 1350 units/hr Check anti-Xa level in 6 hours (@1300 ) for confirmatory level and daily while on heparin, Continue to monitor H&H and platelets daily while on heparin  Kristeen Miss, PharmD 10/07/2020 8:57 AM

## 2020-10-07 NOTE — Consult Note (Signed)
[place] for Heparin  Indication: PAD s/p tibial angioplasty   Allergies  Allergen Reactions  . Albumin (Human)     unknown  . Erythromycin Rash  . Midecamycin Rash    Patient Measurements: Height: 5\' 9"  (175.3 cm) Weight: 95.9 kg (211 lb 6.7 oz) IBW/kg (Calculated) : 66.2 Heparin Dosing Weight: 86.7 kg   Vital Signs: Temp: 98.8 F (37.1 C) (12/14 1930) Temp Source: Oral (12/14 1930) BP: 137/106 (12/14 2324) Pulse Rate: 98 (12/14 2324)  Labs: Recent Labs    10/05/20 0809 10/05/20 0836 10/06/20 0407 10/06/20 1516 10/06/20 1520 10/06/20 2251  HGB  --  11.9*  --  11.8*  --   --   HCT  --  36.6  --  36.5  --   --   PLT  --  365  --  359  --   --   APTT  --   --   --  75*  --   --   LABPROT  --   --   --  14.3  --   --   INR  --   --   --  1.2  --   --   HEPARINUNFRC  --   --   --   --  0.28* 0.10*  CREATININE 1.27*  --  1.24*  --   --   --     Estimated Creatinine Clearance: 70.6 mL/min (A) (by C-G formula based on SCr of 1.24 mg/dL (H)).   Medical History: Past Medical History:  Diagnosis Date  . Bipolar disorder ([place])   . Chronic combined systolic and diastolic CHF (congestive heart failure) ([place])    a. ECHO 04/23/14 LV EF: 45% - 50%,  mild LVH, diffuse hypokinesis. LV diastolic dysfunction, high ventricular filling pressure. Mild LA dilation.  . Cocaine abuse ([place])   . COPD (chronic obstructive pulmonary disease) ([place])   . Diabetes mellitus without complication ([place])   . Dyspnea   . GERD (gastroesophageal reflux disease)   . Heroin overdose ([place]) 10/2018  . Hypertension   . MRSA infection    arm wound  . Nicotine dependence with current use   . Seizures ([place])    last seizure March 2021; usual 1-2 per month    Medications:   Per PTA med list, patient was on Eliquis but it is documented that she is not taking.   Patient was previously on heparin 5000 units Q8H- last dose 10/05/2020 @ 1421.  Heparin 5000 units  x1 given at 1332 while in procedure  Assessment: Patient was seen by vascular earlier today and is now s/p  tibial angioplasty. Per consult, begin heparin 2 hours post sheath removal (~1400 per specials RN) and "okay for initial bolus for PAD s/p tibial angioplasty".    Goal of Therapy:  Heparin level 0.3-0.7 units/ml Monitor platelets by anticoagulation protocol: Yes   12/14 1520 HL 0.28; aPTT 75; INR 1.2 12/14 2251 HL 0.10   Plan:  Heparin DW: 86.7 kg  Give 2600 units bolus Increase heparin infusion to 1350 units/hr Check anti-Xa level in 6 hours and daily while on heparin, Continue to monitor H&H and platelets daily while on heparin  Renda Rolls, PharmD, [facility] 10/07/2020 12:33 AM

## 2020-10-07 NOTE — Progress Notes (Signed)
Pt discharged to home.  Midline removed without complication.  AVS given to pt and explained with no further questions.  All belongings at bedside taken with pt.  Pt transported off unit via WC.

## 2020-10-09 LAB — AEROBIC/ANAEROBIC CULTURE W GRAM STAIN (SURGICAL/DEEP WOUND): Gram Stain: NONE SEEN

## 2020-11-10 ENCOUNTER — Other Ambulatory Visit (INDEPENDENT_AMBULATORY_CARE_PROVIDER_SITE_OTHER): Payer: Self-pay | Admitting: Vascular Surgery

## 2020-11-10 DIAGNOSIS — Z9862 Peripheral vascular angioplasty status: Secondary | ICD-10-CM

## 2020-11-10 DIAGNOSIS — I70261 Atherosclerosis of native arteries of extremities with gangrene, right leg: Secondary | ICD-10-CM

## 2020-11-12 ENCOUNTER — Ambulatory Visit (INDEPENDENT_AMBULATORY_CARE_PROVIDER_SITE_OTHER): Payer: Medicaid Other | Admitting: Nurse Practitioner

## 2020-11-12 ENCOUNTER — Encounter (INDEPENDENT_AMBULATORY_CARE_PROVIDER_SITE_OTHER): Payer: Medicaid Other

## 2021-01-04 ENCOUNTER — Inpatient Hospital Stay (HOSPITAL_COMMUNITY)
Admission: AD | Admit: 2021-01-04 | Discharge: 2021-01-12 | DRG: 070 | Disposition: A | Discharge status: Skilled Nursing Facility | Payer: Medicaid Other | Source: Other Acute Inpatient Hospital | Attending: Internal Medicine | Admitting: Internal Medicine

## 2021-01-04 ENCOUNTER — Inpatient Hospital Stay (HOSPITAL_COMMUNITY): Payer: Medicaid Other

## 2021-01-04 ENCOUNTER — Encounter (HOSPITAL_COMMUNITY): Payer: Self-pay | Admitting: Internal Medicine

## 2021-01-04 ENCOUNTER — Other Ambulatory Visit: Payer: Self-pay

## 2021-01-04 DIAGNOSIS — I6340 Cerebral infarction due to embolism of unspecified cerebral artery: Secondary | ICD-10-CM | POA: Diagnosis present

## 2021-01-04 DIAGNOSIS — R609 Edema, unspecified: Secondary | ICD-10-CM | POA: Diagnosis not present

## 2021-01-04 DIAGNOSIS — N179 Acute kidney failure, unspecified: Secondary | ICD-10-CM | POA: Diagnosis present

## 2021-01-04 DIAGNOSIS — I739 Peripheral vascular disease, unspecified: Secondary | ICD-10-CM | POA: Diagnosis present

## 2021-01-04 DIAGNOSIS — J449 Chronic obstructive pulmonary disease, unspecified: Secondary | ICD-10-CM | POA: Diagnosis present

## 2021-01-04 DIAGNOSIS — N390 Urinary tract infection, site not specified: Secondary | ICD-10-CM | POA: Diagnosis present

## 2021-01-04 DIAGNOSIS — Z8679 Personal history of other diseases of the circulatory system: Secondary | ICD-10-CM

## 2021-01-04 DIAGNOSIS — Z8673 Personal history of transient ischemic attack (TIA), and cerebral infarction without residual deficits: Secondary | ICD-10-CM

## 2021-01-04 DIAGNOSIS — L97429 Non-pressure chronic ulcer of left heel and midfoot with unspecified severity: Secondary | ICD-10-CM | POA: Diagnosis present

## 2021-01-04 DIAGNOSIS — I5042 Chronic combined systolic (congestive) and diastolic (congestive) heart failure: Secondary | ICD-10-CM | POA: Diagnosis present

## 2021-01-04 DIAGNOSIS — Z881 Allergy status to other antibiotic agents status: Secondary | ICD-10-CM

## 2021-01-04 DIAGNOSIS — E870 Hyperosmolality and hypernatremia: Secondary | ICD-10-CM | POA: Diagnosis present

## 2021-01-04 DIAGNOSIS — L97522 Non-pressure chronic ulcer of other part of left foot with fat layer exposed: Secondary | ICD-10-CM | POA: Diagnosis present

## 2021-01-04 DIAGNOSIS — Z6831 Body mass index (BMI) 31.0-31.9, adult: Secondary | ICD-10-CM

## 2021-01-04 DIAGNOSIS — E1142 Type 2 diabetes mellitus with diabetic polyneuropathy: Secondary | ICD-10-CM | POA: Diagnosis present

## 2021-01-04 DIAGNOSIS — N1831 Chronic kidney disease, stage 3a: Secondary | ICD-10-CM | POA: Diagnosis present

## 2021-01-04 DIAGNOSIS — F17210 Nicotine dependence, cigarettes, uncomplicated: Secondary | ICD-10-CM | POA: Diagnosis present

## 2021-01-04 DIAGNOSIS — I7025 Atherosclerosis of native arteries of other extremities with ulceration: Secondary | ICD-10-CM | POA: Diagnosis present

## 2021-01-04 DIAGNOSIS — N189 Chronic kidney disease, unspecified: Secondary | ICD-10-CM | POA: Diagnosis present

## 2021-01-04 DIAGNOSIS — I5022 Chronic systolic (congestive) heart failure: Secondary | ICD-10-CM | POA: Diagnosis present

## 2021-01-04 DIAGNOSIS — Z7401 Bed confinement status: Secondary | ICD-10-CM

## 2021-01-04 DIAGNOSIS — Z90710 Acquired absence of both cervix and uterus: Secondary | ICD-10-CM

## 2021-01-04 DIAGNOSIS — Z7901 Long term (current) use of anticoagulants: Secondary | ICD-10-CM

## 2021-01-04 DIAGNOSIS — Z20822 Contact with and (suspected) exposure to covid-19: Secondary | ICD-10-CM | POA: Diagnosis present

## 2021-01-04 DIAGNOSIS — G9341 Metabolic encephalopathy: Secondary | ICD-10-CM | POA: Diagnosis present

## 2021-01-04 DIAGNOSIS — E1143 Type 2 diabetes mellitus with diabetic autonomic (poly)neuropathy: Secondary | ICD-10-CM | POA: Diagnosis present

## 2021-01-04 DIAGNOSIS — Z8249 Family history of ischemic heart disease and other diseases of the circulatory system: Secondary | ICD-10-CM

## 2021-01-04 DIAGNOSIS — E669 Obesity, unspecified: Secondary | ICD-10-CM | POA: Diagnosis present

## 2021-01-04 DIAGNOSIS — E878 Other disorders of electrolyte and fluid balance, not elsewhere classified: Secondary | ICD-10-CM | POA: Diagnosis present

## 2021-01-04 DIAGNOSIS — E1151 Type 2 diabetes mellitus with diabetic peripheral angiopathy without gangrene: Secondary | ICD-10-CM | POA: Diagnosis present

## 2021-01-04 DIAGNOSIS — K219 Gastro-esophageal reflux disease without esophagitis: Secondary | ICD-10-CM | POA: Diagnosis present

## 2021-01-04 DIAGNOSIS — R7881 Bacteremia: Secondary | ICD-10-CM | POA: Diagnosis present

## 2021-01-04 DIAGNOSIS — G40909 Epilepsy, unspecified, not intractable, without status epilepticus: Secondary | ICD-10-CM | POA: Diagnosis present

## 2021-01-04 DIAGNOSIS — F4312 Post-traumatic stress disorder, chronic: Secondary | ICD-10-CM | POA: Diagnosis present

## 2021-01-04 DIAGNOSIS — Z8614 Personal history of Methicillin resistant Staphylococcus aureus infection: Secondary | ICD-10-CM

## 2021-01-04 DIAGNOSIS — I6330 Cerebral infarction due to thrombosis of unspecified cerebral artery: Secondary | ICD-10-CM | POA: Insufficient documentation

## 2021-01-04 DIAGNOSIS — E1165 Type 2 diabetes mellitus with hyperglycemia: Secondary | ICD-10-CM | POA: Diagnosis present

## 2021-01-04 DIAGNOSIS — E1122 Type 2 diabetes mellitus with diabetic chronic kidney disease: Secondary | ICD-10-CM | POA: Diagnosis present

## 2021-01-04 DIAGNOSIS — I10 Essential (primary) hypertension: Secondary | ICD-10-CM | POA: Diagnosis present

## 2021-01-04 DIAGNOSIS — E119 Type 2 diabetes mellitus without complications: Secondary | ICD-10-CM

## 2021-01-04 DIAGNOSIS — Z7984 Long term (current) use of oral hypoglycemic drugs: Secondary | ICD-10-CM

## 2021-01-04 DIAGNOSIS — G4700 Insomnia, unspecified: Secondary | ICD-10-CM | POA: Diagnosis present

## 2021-01-04 DIAGNOSIS — F319 Bipolar disorder, unspecified: Secondary | ICD-10-CM | POA: Diagnosis present

## 2021-01-04 DIAGNOSIS — E282 Polycystic ovarian syndrome: Secondary | ICD-10-CM | POA: Diagnosis present

## 2021-01-04 DIAGNOSIS — E785 Hyperlipidemia, unspecified: Secondary | ICD-10-CM | POA: Diagnosis present

## 2021-01-04 DIAGNOSIS — Z888 Allergy status to other drugs, medicaments and biological substances status: Secondary | ICD-10-CM

## 2021-01-04 DIAGNOSIS — Z79899 Other long term (current) drug therapy: Secondary | ICD-10-CM

## 2021-01-04 DIAGNOSIS — E860 Dehydration: Secondary | ICD-10-CM | POA: Diagnosis present

## 2021-01-04 DIAGNOSIS — B9620 Unspecified Escherichia coli [E. coli] as the cause of diseases classified elsewhere: Secondary | ICD-10-CM

## 2021-01-04 DIAGNOSIS — I340 Nonrheumatic mitral (valve) insufficiency: Secondary | ICD-10-CM | POA: Diagnosis not present

## 2021-01-04 DIAGNOSIS — I130 Hypertensive heart and chronic kidney disease with heart failure and stage 1 through stage 4 chronic kidney disease, or unspecified chronic kidney disease: Secondary | ICD-10-CM | POA: Diagnosis present

## 2021-01-04 DIAGNOSIS — E118 Type 2 diabetes mellitus with unspecified complications: Secondary | ICD-10-CM

## 2021-01-04 DIAGNOSIS — G9340 Encephalopathy, unspecified: Secondary | ICD-10-CM

## 2021-01-04 DIAGNOSIS — I342 Nonrheumatic mitral (valve) stenosis: Secondary | ICD-10-CM | POA: Diagnosis not present

## 2021-01-04 DIAGNOSIS — I255 Ischemic cardiomyopathy: Secondary | ICD-10-CM | POA: Diagnosis present

## 2021-01-04 DIAGNOSIS — I361 Nonrheumatic tricuspid (valve) insufficiency: Secondary | ICD-10-CM | POA: Diagnosis not present

## 2021-01-04 DIAGNOSIS — Z7982 Long term (current) use of aspirin: Secondary | ICD-10-CM

## 2021-01-04 DIAGNOSIS — K3184 Gastroparesis: Secondary | ICD-10-CM | POA: Diagnosis present

## 2021-01-04 DIAGNOSIS — Z89421 Acquired absence of other right toe(s): Secondary | ICD-10-CM

## 2021-01-04 DIAGNOSIS — Z89422 Acquired absence of other left toe(s): Secondary | ICD-10-CM

## 2021-01-04 DIAGNOSIS — Z794 Long term (current) use of insulin: Secondary | ICD-10-CM

## 2021-01-04 DIAGNOSIS — Z9049 Acquired absence of other specified parts of digestive tract: Secondary | ICD-10-CM

## 2021-01-04 LAB — PROTEIN, CSF: Total  Protein, CSF: 58 mg/dL — ABNORMAL HIGH (ref 15–45)

## 2021-01-04 LAB — CBC WITH DIFFERENTIAL/PLATELET
Abs Immature Granulocytes: 0.06 10*3/uL (ref 0.00–0.07)
Basophils Absolute: 0 10*3/uL (ref 0.0–0.1)
Basophils Relative: 0 %
Eosinophils Absolute: 0.1 10*3/uL (ref 0.0–0.5)
Eosinophils Relative: 1 %
HCT: 30.9 % — ABNORMAL LOW (ref 36.0–46.0)
Hemoglobin: 9.5 g/dL — ABNORMAL LOW (ref 12.0–15.0)
Immature Granulocytes: 1 %
Lymphocytes Relative: 10 %
Lymphs Abs: 1 10*3/uL (ref 0.7–4.0)
MCH: 26.6 pg (ref 26.0–34.0)
MCHC: 30.7 g/dL (ref 30.0–36.0)
MCV: 86.6 fL (ref 80.0–100.0)
Monocytes Absolute: 0.7 10*3/uL (ref 0.1–1.0)
Monocytes Relative: 7 %
Neutro Abs: 7.9 10*3/uL — ABNORMAL HIGH (ref 1.7–7.7)
Neutrophils Relative %: 81 %
Platelets: 143 10*3/uL — ABNORMAL LOW (ref 150–400)
RBC: 3.57 MIL/uL — ABNORMAL LOW (ref 3.87–5.11)
RDW: 17.2 % — ABNORMAL HIGH (ref 11.5–15.5)
WBC: 9.7 10*3/uL (ref 4.0–10.5)
nRBC: 0 % (ref 0.0–0.2)

## 2021-01-04 LAB — CSF CELL COUNT WITH DIFFERENTIAL
RBC Count, CSF: 0 /mm3
Tube #: 3
WBC, CSF: 1 /mm3 (ref 0–5)

## 2021-01-04 LAB — COMPREHENSIVE METABOLIC PANEL
ALT: 11 U/L (ref 0–44)
AST: 15 U/L (ref 15–41)
Albumin: 1.6 g/dL — ABNORMAL LOW (ref 3.5–5.0)
Alkaline Phosphatase: 88 U/L (ref 38–126)
Anion gap: 6 (ref 5–15)
BUN: 43 mg/dL — ABNORMAL HIGH (ref 6–20)
CO2: 20 mmol/L — ABNORMAL LOW (ref 22–32)
Calcium: 8.2 mg/dL — ABNORMAL LOW (ref 8.9–10.3)
Chloride: 124 mmol/L — ABNORMAL HIGH (ref 98–111)
Creatinine, Ser: 2.88 mg/dL — ABNORMAL HIGH (ref 0.44–1.00)
GFR, Estimated: 20 mL/min — ABNORMAL LOW (ref >60)
Glucose, Bld: 209 mg/dL — ABNORMAL HIGH (ref 70–99)
Potassium: 4.2 mmol/L (ref 3.5–5.1)
Sodium: 150 mmol/L — ABNORMAL HIGH (ref 135–145)
Total Bilirubin: 0.3 mg/dL (ref 0.3–1.2)
Total Protein: 5.9 g/dL — ABNORMAL LOW (ref 6.5–8.1)

## 2021-01-04 LAB — GLUCOSE, CSF: Glucose, CSF: 123 mg/dL — ABNORMAL HIGH (ref 40–70)

## 2021-01-04 LAB — GLUCOSE, CAPILLARY
Glucose-Capillary: 177 mg/dL — ABNORMAL HIGH (ref 70–99)
Glucose-Capillary: 190 mg/dL — ABNORMAL HIGH (ref 70–99)
Glucose-Capillary: 192 mg/dL — ABNORMAL HIGH (ref 70–99)
Glucose-Capillary: 192 mg/dL — ABNORMAL HIGH (ref 70–99)
Glucose-Capillary: 216 mg/dL — ABNORMAL HIGH (ref 70–99)

## 2021-01-04 LAB — HEMOGLOBIN A1C
Hgb A1c MFr Bld: 14.3 % — ABNORMAL HIGH (ref 4.8–5.6)
Mean Plasma Glucose: 363.71 mg/dL

## 2021-01-04 MED ORDER — INSULIN ASPART 100 UNIT/ML ~~LOC~~ SOLN
0.0000 [IU] | SUBCUTANEOUS | Status: DC | PRN
  Administered 2021-01-04: 2 [IU] via SUBCUTANEOUS
  Administered 2021-01-06: 3 [IU] via SUBCUTANEOUS
  Administered 2021-01-06 (×2): 2 [IU] via SUBCUTANEOUS
  Administered 2021-01-07: 1 [IU] via SUBCUTANEOUS
  Administered 2021-01-08: 3 [IU] via SUBCUTANEOUS
  Administered 2021-01-10: 2 [IU] via SUBCUTANEOUS
  Administered 2021-01-11: 5 [IU] via SUBCUTANEOUS

## 2021-01-04 MED ORDER — ACETAMINOPHEN 325 MG PO TABS
650.0000 mg | ORAL_TABLET | Freq: Four times a day (QID) | ORAL | Status: DC | PRN
  Administered 2021-01-10 – 2021-01-12 (×3): 650 mg via ORAL
  Filled 2021-01-04 (×3): qty 2

## 2021-01-04 MED ORDER — VANCOMYCIN VARIABLE DOSE PER UNSTABLE RENAL FUNCTION (PHARMACIST DOSING): Status: DC

## 2021-01-04 MED ORDER — GABAPENTIN 250 MG/5ML PO SOLN
200.0000 mg | Freq: Three times a day (TID) | ORAL | Status: DC
  Administered 2021-01-04 – 2021-01-08 (×9): 200 mg via ORAL
  Filled 2021-01-04 (×16): qty 4

## 2021-01-04 MED ORDER — LORAZEPAM 2 MG/ML IJ SOLN
1.0000 mg | Freq: Once | INTRAMUSCULAR | Status: AC
  Administered 2021-01-04: 1 mg via INTRAVENOUS
  Filled 2021-01-04: qty 1

## 2021-01-04 MED ORDER — SODIUM CHLORIDE 0.9 % IV SOLN
2.0000 g | Freq: Two times a day (BID) | INTRAVENOUS | Status: DC
  Administered 2021-01-04 (×2): 2 g via INTRAVENOUS
  Filled 2021-01-04: qty 20
  Filled 2021-01-04: qty 2
  Filled 2021-01-04 (×2): qty 20

## 2021-01-04 MED ORDER — GABAPENTIN 250 MG/5ML PO SOLN
200.0000 mg | Freq: Three times a day (TID) | ORAL | Status: DC
  Filled 2021-01-04 (×2): qty 4

## 2021-01-04 MED ORDER — INSULIN ASPART 100 UNIT/ML ~~LOC~~ SOLN
0.0000 [IU] | Freq: Three times a day (TID) | SUBCUTANEOUS | Status: DC
  Administered 2021-01-04: 3 [IU] via SUBCUTANEOUS
  Administered 2021-01-04: 2 [IU] via SUBCUTANEOUS

## 2021-01-04 MED ORDER — ALBUTEROL SULFATE (2.5 MG/3ML) 0.083% IN NEBU: 2.5000 mg | INHALATION_SOLUTION | RESPIRATORY_TRACT | Status: DC | PRN

## 2021-01-04 MED ORDER — DEXTROSE-NACL 5-0.9 % IV SOLN: INTRAVENOUS | Status: DC

## 2021-01-04 MED ORDER — METOPROLOL TARTRATE 12.5 MG HALF TABLET
12.5000 mg | ORAL_TABLET | Freq: Two times a day (BID) | ORAL | Status: DC
  Administered 2021-01-04 – 2021-01-12 (×14): 12.5 mg via ORAL
  Filled 2021-01-04 (×17): qty 1

## 2021-01-04 MED ORDER — VENLAFAXINE HCL ER 75 MG PO CP24
75.0000 mg | ORAL_CAPSULE | Freq: Every day | ORAL | Status: DC
  Administered 2021-01-04 – 2021-01-08 (×5): 75 mg via ORAL
  Filled 2021-01-04 (×5): qty 1

## 2021-01-04 MED ORDER — INSULIN GLARGINE 100 UNIT/ML ~~LOC~~ SOLN
5.0000 [IU] | Freq: Every day | SUBCUTANEOUS | Status: DC
  Administered 2021-01-04: 5 [IU] via SUBCUTANEOUS
  Filled 2021-01-04 (×2): qty 0.05

## 2021-01-04 MED ORDER — OSMOLITE 1.2 CAL PO LIQD: 1000.0000 mL | ORAL | Status: DC

## 2021-01-04 MED ORDER — CHLORHEXIDINE GLUCONATE CLOTH 2 % EX PADS
6.0000 | MEDICATED_PAD | Freq: Every day | CUTANEOUS | Status: DC
  Administered 2021-01-04 – 2021-01-09 (×5): 6 via TOPICAL

## 2021-01-04 MED ORDER — SODIUM CHLORIDE 0.45 % IV SOLN: INTRAVENOUS | Status: DC

## 2021-01-04 MED ORDER — DEXTROSE 5 % IV SOLN
750.0000 mg | Freq: Two times a day (BID) | INTRAVENOUS | Status: DC
  Administered 2021-01-04 – 2021-01-05 (×4): 750 mg via INTRAVENOUS
  Filled 2021-01-04 (×6): qty 15

## 2021-01-04 MED ORDER — HEPARIN SODIUM (PORCINE) 5000 UNIT/ML IJ SOLN
5000.0000 [IU] | Freq: Three times a day (TID) | INTRAMUSCULAR | Status: DC
  Administered 2021-01-04 – 2021-01-12 (×25): 5000 [IU] via SUBCUTANEOUS
  Filled 2021-01-04 (×24): qty 1

## 2021-01-04 MED ORDER — QUETIAPINE FUMARATE 200 MG PO TABS
400.0000 mg | ORAL_TABLET | Freq: Every day | ORAL | Status: DC
  Administered 2021-01-04 – 2021-01-08 (×4): 400 mg via ORAL
  Filled 2021-01-04 (×4): qty 2

## 2021-01-04 MED ORDER — GABAPENTIN 250 MG/5ML PO SOLN
100.0000 mg | Freq: Three times a day (TID) | ORAL | Status: DC
  Filled 2021-01-04 (×2): qty 2

## 2021-01-04 MED ORDER — ACETAMINOPHEN 650 MG RE SUPP: 650.0000 mg | Freq: Four times a day (QID) | RECTAL | Status: DC | PRN

## 2021-01-04 MED ORDER — LIDOCAINE HCL (PF) 1 % IJ SOLN
5.0000 mL | Freq: Once | INTRAMUSCULAR | Status: AC
  Administered 2021-01-04: 5 mL via INTRADERMAL

## 2021-01-04 MED ORDER — SODIUM CHLORIDE 0.9 % IV SOLN
2.0000 g | Freq: Four times a day (QID) | INTRAVENOUS | Status: DC
  Administered 2021-01-04 (×2): 2 g via INTRAVENOUS
  Filled 2021-01-04 (×5): qty 2000

## 2021-01-04 MED ORDER — INSULIN ASPART 100 UNIT/ML ~~LOC~~ SOLN: 4.0000 [IU] | SUBCUTANEOUS | Status: DC

## 2021-01-04 NOTE — Progress Notes (Signed)
Pharmacy Antibiotic Note  [name] is a 45 y.o. female transferrered from [facility] on 01/04/2021 with AMS, possible meningitis.  Pharmacy has been consulted for Vancomycin dosing.  Blood cultures at [facility] grew pan sensitive E. coli, treated with Cefepime and Rocephin.  Vancomycin started 3/12, last dose of 1250 mg given 3/13 at 2145.  SSr 3/13 2.7, down from 3.2 on admit 3/8  Plan: Check Vancomycin trough tonight at 10 pm  Redose as indicated   Temp (24hrs), Avg:99.5 F (37.5 C), Min:99.5 F (37.5 C), Max:99.5 F (37.5 C)  No results for input(s): WBC, CREATININE, LATICACIDVEN, VANCOTROUGH, VANCOPEAK, VANCORANDOM, GENTTROUGH, GENTPEAK, GENTRANDOM, TOBRATROUGH, TOBRAPEAK, TOBRARND, AMIKACINPEAK, AMIKACINTROU, AMIKACIN in the last 168 hours.  CrCl cannot be calculated (Patient's most recent lab result is older than the maximum 21 days allowed.).    Allergies  Allergen Reactions   Albumin (Human)     unknown   Erythromycin Rash   Midecamycin Rash    [name] 01/04/2021 3:30 AM

## 2021-01-04 NOTE — Progress Notes (Signed)
Pt  A new admission from [facility] to room 3 W 30 . [facility] admitting MD notify awaiting admitting orders. Patient alert follow simple command moves all extremities non  -purposefully. Pt had a RUA double lumen Picc line.  Dressing intact with old stained drainage benealht the biopatch.and sone bruises around it. IV Team consulted  Pt also with multiple unchangeable wound to bil heels and under  R and Lleft foot stumps MASD to sacral bil. groins and perineum with

## 2021-01-04 NOTE — Progress Notes (Signed)
PROGRESS NOTE    [name]  ATF:[id number] DOB: 1976/02/24 DOA: 01/04/2021 PCP: [facility]    Brief Narrative:  45 year old female with extensive medical problems including chronic gastric heart failure, type 2 diabetes on insulin, hypertension, COPD, GERD, seizure disorder, bipolar disorder, history of substance abuse, sensory neuropathy who was admitted to [facility] since 3/8 where she presented with confusion of about 1 day.  Patient currently unable to provide any history.  Her mother who lives with her able to give history that she was at her usual self 2 days prior to admission.  At the outside hospital, she was found to be in HHS with blood sugars more than 1200.  Treated with insulin drip then subsequently changed to subcu insulin with no improvement of mentation.  She started spiking fever, blood culture reportedly with E. coli, urine culture not reported.  Treated with ceftriaxone.  With persistent encephalopathy despite treatment she was transferred to [facility] with neurology consultation.   Assessment & Plan:   Principal Problem:   Metabolic encephalopathy Active Problems:   Bipolar disorder, unspecified ([place])   DM type 2 (diabetes mellitus, type 2)- poorly controlled   Ulcer of great toe, left, with fat layer exposed ([place])   Essential hypertension   Chronic systolic CHF (congestive heart failure) ([facility])   PVD (peripheral vascular disease) ([place])   Acute kidney injury superimposed on CKD ([place])   Bacteremia due to Escherichia coli   Acute metabolic encephalopathy  Acute metabolic encephalopathy: Patient with underlying history of bipolar disorder.  This is probably multifactorial. HSS is corrected but mental status did not correct. 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  MRI brain pending.  No focal deficits. All-time fall precautions.  Delirium precautions.  Patient is on multiple medications including Seroquel at night,  will resume lower doses to stabilize mood. EEG pending. Appreciate neurology input.  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  Patient already have source of infection with E. coli bacteremia and E. coli UTI.  Less likely CNS infection. Spinal tap was ordered with radiology, preliminary test results with 1 WBC and mildly elevated protein and sugars.  Opening pressures normal. Not consistent with bacterial meningitis.  Discontinue vancomycin and ampicillin.  Ceftriaxone for E. coli bacteremia. Continue acyclovir until HSV 1/2 PCR available.  E. coli bacteremia: Blood cultures 3/8 , urine cultures 3/8 outside hospital with E. coli pansensitive.  Repeat cultures negative.  CT scan of the abdomen pelvis without any evidence of hydronephrosis or obstruction. Continue Rocephin, will treat total 10 days of therapy.  Will change to oral once she is able to take by mouth.  Repeat cultures done 3/13 on admission. Patient has Foley catheter in place, will remove once she is more awake.  Acute kidney injury with history of chronic kidney disease stage IIIa: On previous hospitalization start baseline creatinine has been 1.2-1.4.  Now with evidence of dehydration.  Continue half-normal saline and monitor levels.  Urine output is adequate.  Hyperchloremic hypernatremia: Due to total free water deficit.  IV fluids changed to half-normal saline and monitor levels closely.  Type 2 diabetes with HHS: Blood sugars fairly controlled.  Hemoglobin A1c is 14.  Patient is n.p.o.  Insert Dobbhoff tube and start feeding.  Will start insulin every 4 hours once she has feeding tube in place.  Also add sliding scale insulin.   Patient has not eaten for 1 week.  Chronic systolic heart failure: Compensated.  Holding diuretics.  Peripheral vascular disease/peripheral arterial stents:  Mother reported that patient is off anticoagulation now.  No evidence of active ischemia.  History of bipolar disorder/peripheral  neuropathy: Patient on  amitriptyline 150 mg daily Gabapentin 600 mg 3 times a day Seroquel 800 mg at night Venlafaxine 150 mg daily. With altered mental status, all her medications were held.  Patient may go into withdrawals from SSRIs.  TCAs less likely to develop withdrawals.  She does not have any evidence of neuroleptic malignant syndrome. We will resume gabapentin one third of the dose, Seroquel half dose, resume venlafaxine.  Will resume these medications once patient has established tube feeding.    DVT prophylaxis: heparin injection 5,000 Units Start: 01/04/21 0600   Code Status: Full code Family Communication: Mother at bedside Disposition Plan: Status is: Inpatient  Remains inpatient appropriate because:IV treatments appropriate due to intensity of illness or inability to take PO and Inpatient level of care appropriate due to severity of illness   Dispo: The patient is from: Home              Anticipated d/c is to: SNF              Patient currently is not medically stable to d/c.   Difficult to place patient No         Consultants:   Neurology  Procedures:   Lumbar puncture  EEG  Antimicrobials:  Antibiotics Given (last 72 hours)    Date/Time Action Medication Dose Rate   01/04/21 0653 New Bag/Given   ampicillin (OMNIPEN) 2 g in sodium chloride 0.9 % 100 mL IVPB 2 g 300 mL/hr   01/04/21 0657 New Bag/Given   acyclovir (ZOVIRAX) 750 mg in dextrose 5 % 150 mL IVPB 750 mg 165 mL/hr   01/04/21 0945 New Bag/Given   cefTRIAXone (ROCEPHIN) 2 g in sodium chloride 0.9 % 100 mL IVPB 2 g 200 mL/hr   01/04/21 1301 New Bag/Given   ampicillin (OMNIPEN) 2 g in sodium chloride 0.9 % 100 mL IVPB 2 g 300 mL/hr         Subjective: Patient seen and examined.  Sleeping quiet.  Tries to open eyes on strong stimulation, however goes back to sleep.  Does not follow any commands.  Occasionally she utter some word without any sense. Met with patient's mother at the  bedside earlier morning and discussed in detail.  She stated that patient was functional and at her normal self until 1 day before admission to outside hospital.  Objective: Vitals:   01/04/21 0001 01/04/21 0329 01/04/21 0808 01/04/21 1143  BP: (!) 147/93 (!) 142/89 (!) 144/93 118/85  Pulse: (!) 103 98    Resp: 20 18 18 18   Temp: 99.5 F (37.5 C) 98.2 F (36.8 C) 97.7 F (36.5 C) 97.7 F (36.5 C)  TempSrc: Oral Oral Oral Oral  SpO2: 100% 100% 100% 100%  Weight:   95.7 kg   Height:   5' 9"  (1.753 m)     Intake/Output Summary (Last 24 hours) at 01/04/2021 1414 Last data filed at 01/04/2021 0657 Gross per 24 hour  Intake 425.7 ml  Output 500 ml  Net -74.3 ml   Filed Weights   01/04/21 0808  Weight: 95.7 kg    Examination:  General exam: Appears calm.  Not in any distress. Unintelligible speech.  Tries to open eyes but does not follow or track. Patient does not have any neck rigidity. Respiratory system: Clear to auscultation. Respiratory effort normal.  No added sounds. Cardiovascular system: S1 & S2  heard, RRR.  No edema.. Gastrointestinal system: Abdomen is nondistended, soft and nontender. No organomegaly or masses felt. Normal bowel sounds heard. Central nervous system: Sleepy.  Lethargic.  Does not follow commands but does some random movements with her hands and legs.   Data Reviewed: I have personally reviewed following labs and imaging studies  CBC: Recent Labs  Lab January 24, 2021 0313  WBC 9.7  NEUTROABS 7.9*  HGB 9.5*  HCT 30.9*  MCV 86.6  PLT 376*   Basic Metabolic Panel: Recent Labs  Lab 24-Jan-2021 0313  NA 150*  K 4.2  CL 124*  CO2 20*  GLUCOSE 209*  BUN 43*  CREATININE 2.88*  CALCIUM 8.2*   GFR: Estimated Creatinine Clearance: 30.4 mL/min (A) (by C-G formula based on SCr of 2.88 mg/dL (H)). Liver Function Tests: Recent Labs  Lab 2021-01-24 0313  AST 15  ALT 11  ALKPHOS 88  BILITOT 0.3  PROT 5.9*  ALBUMIN 1.6*   No results for input(s):  LIPASE, AMYLASE in the last 168 hours. No results for input(s): AMMONIA in the last 168 hours. Coagulation Profile: No results for input(s): INR, PROTIME in the last 168 hours. Cardiac Enzymes: No results for input(s): CKTOTAL, CKMB, CKMBINDEX, TROPONINI in the last 168 hours. BNP (last 3 results) No results for input(s): PROBNP in the last 8760 hours. HbA1C: Recent Labs    January 24, 2021 0320  HGBA1C 14.3*   CBG: Recent Labs  Lab January 24, 2021 0039 01-24-21 0620 Jan 24, 2021 1137  GLUCAP 192* 216* 190*   Lipid Profile: No results for input(s): CHOL, HDL, LDLCALC, TRIG, CHOLHDL, LDLDIRECT in the last 72 hours. Thyroid Function Tests: No results for input(s): TSH, T4TOTAL, FREET4, T3FREE, THYROIDAB in the last 72 hours. Anemia Panel: No results for input(s): VITAMINB12, FOLATE, FERRITIN, TIBC, IRON, RETICCTPCT in the last 72 hours. Sepsis Labs: No results for input(s): PROCALCITON, LATICACIDVEN in the last 168 hours.  No results found for this or any previous visit (from the past 240 hour(s)).       Radiology Studies: EEG adult  Result Date: 01-24-21 Lora Havens, MD     2021/01/24 10:35 AM Patient Name: MIU CHIONG MRN: [id number] Epilepsy Attending: Lora Havens Referring Physician/Provider: Dr. Kerney Elbe Date: 24-Jan-2021 Duration: 24.13 mins Patient history: 45 year old female with altered mental status.  EEG to evaluate for seizures. Level of alertness: Awake AEDs during EEG study: None Technical aspects: This EEG study was done with scalp electrodes positioned according to the 10-20 International system of electrode placement. Electrical activity was acquired at a sampling rate of 500Hz  and reviewed with a high frequency filter of 70Hz  and a low frequency filter of 1Hz . EEG data were recorded continuously and digitally stored. Description: The posterior dominant rhythm consists of 8-9 Hz activity of moderate voltage (25-35 uV) seen predominantly in posterior head regions,  symmetric and reactive to eye opening and eye closing. EEG showed intermittent generalized 3 to 6 Hz theta-delta slowing. Hyperventilation and photic stimulation were not performed.   ABNORMALITY -Intermittent slow, generalized IMPRESSION: This study is suggestive of mild diffuse encephalopathy, nonspecific etiology. No seizures or epileptiform discharges were seen throughout the recording. Lora Havens   DG FL GUIDED LUMBAR PUNCTURE  Result Date: 24-Jan-2021 CLINICAL DATA:  Encephalopathy. EXAM: DIAGNOSTIC LUMBAR PUNCTURE UNDER FLUOROSCOPIC GUIDANCE COMPARISON:  None FLUOROSCOPY TIME:  Fluoroscopy Time:  0 minutes and 18 seconds. Radiation Exposure Index (if provided by the fluoroscopic device): 4.50 mGy Number of Acquired Spot Images: 0 PROCEDURE: Informed consent was obtained from  the patient's mother prior to the procedure, including potential complications of headache, allergy, and pain. With the patient prone, an appropriate site for lumbar puncture was marked on the patient's skin. The lower back was then prepped with Betadine. 1% Lidocaine was used for local anesthesia. Lumbar puncture was performed at the L3-4 level using a 20 gauge needle with return of clear CSF with an opening pressure of 20 cm water. 10 ml of CSF were obtained for laboratory studies. The patient tolerated the procedure well and there were no apparent complications. IMPRESSION: Fluoroscopic guided lumbar puncture with 10 cc of clear CSF obtained for appropriate laboratory evaluation. Electronically Signed   By: Marijo Sanes M.D.   On: 01/04/2021 12:18        Scheduled Meds: . Chlorhexidine Gluconate Cloth  6 each Topical Daily  . gabapentin  200 mg Per Tube Q8H  . heparin  5,000 Units Subcutaneous Q8H  . insulin aspart  4 Units Subcutaneous Q4H  . insulin glargine  5 Units Subcutaneous QHS  . metoprolol tartrate  12.5 mg Oral BID  . QUEtiapine  400 mg Oral QHS  . venlafaxine XR  75 mg Oral Daily   Continuous  Infusions: . sodium chloride 100 mL/hr at 01/04/21 0831  . acyclovir Stopped (01/04/21 0831)  . cefTRIAXone (ROCEPHIN)  IV 2 g (01/04/21 0945)  . feeding supplement (OSMOLITE 1.2 CAL)       LOS: 0 days    Time spent: Additional 30 minutes    Barb Merino, MD [facility] Pager [phone]
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
## 2021-01-04 NOTE — Plan of Care (Signed)
  Problem: Clinical Measurements: Goal: Complications related to the disease process, condition or treatment will be avoided or minimized Outcome: Progressing   Problem: Health Behavior/Discharge Planning: Goal: Compliance with prescribed medication regimen will improve Outcome: Progressing   Problem: Coping: Goal: Ability to identify appropriate support needs will improve Outcome: Progressing

## 2021-01-04 NOTE — Progress Notes (Signed)
EEG complete - results pending 

## 2021-01-04 NOTE — Consult Note (Signed)
NEURO HOSPITALIST CONSULT NOTE   Requestig physician: Dr. Tonie Griffith  Reason for Consult: Encephalopahy  History obtained from:  Chart    HPI:                                                                                                                                          [name] is an 45 y.o. 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 She had blood sugar > 1200 and was diagnosed with HHS at that time. She was treated with IVF and insulin infusion with resolution of HHS. However, she continued to be encephalopathic, unable to communicate or answer questions. She did have AKI superimposed on her CKD; this had been slowly improving but Cr was still not at her baseline. She developed a fever and blood cultures came back positive for E. Coli. An LP was attempted but could not be obtained secondary to patient movement. An MRI could not be obtained for the same reason. Decision was then made to transfer the patient to [facility] for EEG and Neurology consult. She was started on ceftriaxone and vancomycin to cover for possible meningitis. History was obtained from the chart as the patient continues to be encephalopathic, being unable to give any answers or cooperate with exam.    Nov 06, 2020 clinic note with PCP at [facility] was reviewed: "She presents today for follow-up. She had recent amputation of the fourth and fifth toes of her right foot in [place]. She has a persistent superficial ulcerations on both the lateral and medial portion of the foot but the wound itself is well-healed. The ulcer on the left heel is much  better. She needs her medications refilled. 6 weeks ago her grandfather died. Her grandfather and grandmother were in a nursing home. The home that she was living in was supposed to be left to her mother but her aunt got the will changed last year while the parents were in a nursing home and as soon as her grandfather died sold the house, took all the furniture and kicked them out last month. She is now living with a cousin in [place]. She is currently taking 30 units of Lantus twice daily with sliding scale NovoLog. Blood sugar readings were elevated after her recent surgery. She had postop foot infection. She is currently on Septra. She has occasional shortness of breath and uses an albuterol inhaler. She denies any chest pain. She has had difficulty sleeping and occasional nausea since her surgery.  She is taking metoprolol for tachycardia without recent palpitations. She has had recent edema and is taking furosemide twice daily. She has a history of mitral stenosis. Past echocardiogram 1 year ago showed mild ventricular hypertrophy and borderline EF."  Prior studies:   EMG/NCS 01/21/20: Abnormal study. These electrodiagnostic findings are most consistent with severe sensory motor polyneruopathy. Superimposed left median mononeuropathy at the wrist and the left ulnar neuropathy can not be excluded.  EEG December 2016 (Dr. Jaynee Eagles): EEG completely normal. These are non-epileptic events or pseudoseizures.   Past Medical History:  Diagnosis Date  . Bipolar disorder ([place])   . Chronic combined systolic and diastolic CHF (congestive heart failure) ([place])    a. ECHO 04/23/14 LV EF: 45% - 50%,  mild LVH, diffuse hypokinesis. LV diastolic dysfunction, high ventricular filling pressure. Mild LA dilation.  . Cocaine abuse ([place])   . COPD (chronic obstructive pulmonary disease) ([place])   . Diabetes mellitus without complication ([place])   . Dyspnea   . GERD (gastroesophageal reflux disease)   . Heroin overdose ([place])  10/2018  . Hypertension   . MRSA infection    arm wound  . Nicotine dependence with current use   . Seizures ([place])    last seizure March 2021; usual 1-2 per month  . Diabetic neuropathy, with long-term current use of insulin (CMS-HCC)  . Morbid obesity (CMS-HCC)  . Post-traumatic osteoarthritis  . Post-traumatic stress disorder, chronic  . Secondary localized osteoarthrosis of ankle and foot  . Severe cannabis use disorder (CMS-HCC)  . Menometrorrhagia  . Insomnia disorder, with non-sleep disorder mental comorbidity, persistent  . Family discord  . Polycystic ovary syndrome  . Atherosclerosis of native arteries of the extremities with ulceration (CMS-HCC)  . Chronic foot pain, left  . Osteomyelitis of left foot (CMS-HCC)  . History of amputation of left great toe (CMS-HCC)  . Acute congestive heart failure (CMS-HCC)  . Nonrheumatic mitral valve stenosis  . Peripheral vascular disease (CMS-HCC)  . Pressure injury of skin  . CKD stage 3 due to type 2 diabetes mellitus (CMS-HCC)  . Substance use disorder  . Hyperlipidemia    Past Surgical History:  Procedure Laterality Date  . ABDOMINAL HYSTERECTOMY    . ACHILLES TENDON SURGERY Left 03/31/2020   Procedure: ACHILLES LENGTHENING;  Surgeon: Caroline More, DPM;  Location: [facility] OR[facility];  Service: Podiatry;  Laterality: Left;  . AMPUTATION Right 10/04/2020   Procedure: PARTIAL AMPUTATION RAY 4th and 5th;  Surgeon: Caroline More, DPM;  Location: [facility];  Service: Podiatry;  Laterality: Right;  . AMPUTATION TOE Left 03/23/2019   Procedure: AMPUTATION TOE GREAT LEFT TOE;  Surgeon: [name] Patricia, DPM;  Location: [facility];  Service: Podiatry;  Laterality: Left;  . AMPUTATION TOE Left 08/16/2019   Procedure: AMPUTATION TOE 5th;  Surgeon: Caroline More, DPM;  Location: [facility];  Service: Podiatry;  Laterality: Left;  . AMPUTATION TOE Left 02/05/2020   Procedure: AMPUTATION MPJ LEFT 4TH;  Surgeon: Caroline More, DPM;  Location: [facility];   Service: Podiatry;  Laterality: Left;  . ANKLE FRACTURE SURGERY Left    pins/screws in place  . CHOLECYSTECTOMY  2005  . FRACTURE SURGERY    . IRRIGATION AND DEBRIDEMENT FOOT Left 10/04/2020   Procedure: IRRIGATION AND DEBRIDEMENT FOOT;  Surgeon: Caroline More, DPM;  Location: [facility];  Service: Podiatry;  Laterality: Left;  . LOWER EXTREMITY ANGIOGRAPHY Left 03/12/2019   Procedure: LOWER EXTREMITY ANGIOGRAPHY;  Surgeon: Katha Cabal, MD;  Location: [place] [facility];  Service:  Cardiovascular;  Laterality: Left;  . LOWER EXTREMITY ANGIOGRAPHY Right 10/06/2020   Procedure: Lower Extremity Angiography;  Surgeon: Katha Cabal, MD;  Location: [place] [facility];  Service: Cardiovascular;  Laterality: Right;  . TEE WITHOUT CARDIOVERSION N/A 03/26/2019   Procedure: TRANSESOPHAGEAL ECHOCARDIOGRAM (TEE);  Surgeon: Nelva Bush, MD;  Location: [facility];  Service: Cardiovascular;  Laterality: N/A;  . TRANSMETATARSAL AMPUTATION Left 03/31/2020   Procedure: TRANSMETATARSAL AMPUTATION;  Surgeon: Caroline More, DPM;  Location: [facility];  Service: Podiatry;  Laterality: Left;  . TUBAL LIGATION      Family History  Problem Relation Age of Onset  . Hypertension Other   . Drug abuse Mother   . Seizures Neg Hx              Social History:  reports that she has been smoking cigarettes. She has a 28.00 pack-year smoking history. She has never used smokeless tobacco. She reports previous alcohol use. She reports previous drug use.  Allergies  Allergen Reactions  . Albumin (Human)     unknown  . Erythromycin Rash  . Midecamycin Rash    MEDICATIONS:                                                                                                                     Prior to Admission:  Medications Prior to Admission  Medication Sig Dispense Refill Last Dose  . acetaminophen (TYLENOL) 325 MG tablet Take 2 tablets (650 mg total) by mouth every 6 (six) hours as needed for mild pain (or  Fever >/= 101). 30 tablet 0   . amitriptyline (ELAVIL) 150 MG tablet Take 150 mg by mouth at bedtime.     Marland Kitchen aspirin EC 81 MG EC tablet Take 1 tablet (81 mg total) by mouth daily. Swallow whole. 30 tablet 11   . atorvastatin (LIPITOR) 20 MG tablet Take 20 mg by mouth at bedtime.      . bethanechol (URECHOLINE) 25 MG tablet Take 25 mg by mouth 2 (two) times daily.      Marland Kitchen ELIQUIS 2.5 MG TABS tablet Take 1 tablet (2.5 mg total) by mouth 2 (two) times daily. 60 tablet 0   . furosemide (LASIX) 40 MG tablet Take 40 mg by mouth daily.      Marland Kitchen gabapentin (NEURONTIN) 600 MG tablet Take 600 mg by mouth 3 (three) times daily.     Marland Kitchen glipiZIDE (GLUCOTROL) 10 MG tablet Take 10 mg by mouth daily.      . hydrOXYzine (ATARAX/VISTARIL) 50 MG tablet Take 50 mg by mouth 3 (three) times daily as needed for anxiety or itching.   0   . insulin aspart (NOVOLOG) 100 UNIT/ML injection Inject 1 Units into the skin 3 (three) times daily before meals. PRN     . insulin glargine (LANTUS) 100 UNIT/ML injection Inject 30 Units into the skin 2 (two) times a day.      . Insulin Syringe-Needle U-100 (INSULIN SYRINGE .5CC/31GX5/16") 31G X 5/16" 0.5 ML MISC 1  each by Does not apply route daily at 6 (six) AM. 30 each 0   . metFORMIN (GLUCOPHAGE) 1000 MG tablet Take 1,000 mg by mouth 2 (two) times daily with a meal.     . metoprolol tartrate (LOPRESSOR) 50 MG tablet Take 25 mg by mouth 2 (two) times daily.      . mirtazapine (REMERON) 15 MG tablet Take 15 mg by mouth at bedtime.      . pantoprazole (PROTONIX) 20 MG tablet Take 20 mg by mouth daily.      . prazosin (MINIPRESS) 5 MG capsule Take 5 mg by mouth at bedtime.     . promethazine (PHENERGAN) 12.5 MG tablet Take 12.5 mg by mouth every 6 (six) hours as needed for nausea or vomiting.   3   . QUEtiapine (SEROQUEL) 400 MG tablet Take 800 mg by mouth at bedtime.     . senna-docusate (SENOKOT-S) 8.6-50 MG tablet Take 1 tablet by mouth at bedtime as needed for mild constipation. 10  tablet 0   . tamsulosin (FLOMAX) 0.4 MG CAPS capsule Take 0.4 mg by mouth at bedtime.      Marland Kitchen venlafaxine XR (EFFEXOR-XR) 150 MG 24 hr capsule Take 300 mg by mouth at bedtime.   3    Scheduled: . heparin  5,000 Units Subcutaneous Q8H  . insulin aspart  0-9 Units Subcutaneous TID WC  . insulin glargine  5 Units Subcutaneous QHS  . metoprolol tartrate  12.5 mg Oral BID  . vancomycin variable dose per unstable renal function (pharmacist dosing)   Does not apply See admin instructions   Continuous: . cefTRIAXone (ROCEPHIN)  IV    . dextrose 5 % and 0.9% NaCl       ROS:                                                                                                                                       Unable to obtain due to AMS.    Blood pressure (!) 147/93, pulse (!) 103, temperature 99.5 F (37.5 C), temperature source Oral, resp. rate 20, SpO2 100 %.   General Examination:                                                                                                       Physical Exam  HEENT-  [place]/AT. Edentulous.    Lungs- Respirations unlabored Extremities- Left toe amputations. Right foot with dressing.   Neurological Examination Mental Status: Awakens to voice. Decreased  level of alertness. Severely decreased attention. Speech is almost unintelligible and consists of short verbalizations of 1-2 words in response to some questions. Was able to say "March" when asked the month, but was otherwise not oriented. Does not appear to be aware of her current circumstance. Said "blue" when a gloved hand was held in front of her, being asked to identify it.  Cranial Nerves: II: Briefly glances at examiner's face but will not track. Left pupil reactive. Closes eyes tightly and unable to assess right pupil. ,  III,IV, VI: Eyes are conjugate. Did not track to command.  V,VII: Closes eyes to eyelid stimulation. Dyskinetic movements of mouth intermittently noted; in this context face is  symmetric.  VIII: hearing intact to some questions IX,X: Unable to visualize palate XI: Moves head from side to side restlessly at times.  XII: Did not protrude tongue to command Motor: Moves BUE antigravity when asked, left less briskly than right. Squeezes examiners hands and pulls/pushes after repeated commands, left weaker than right Will kick legs up and down in response to "wiggle your feet". No gross asymmetry of lower extremity movement noted.  Sensory: Reacts to tactile stimuli in upper and lower extremities Deep Tendon Reflexes: 1+ bilateral brachioradialis and patellae Cerebellar: Unable to assess; does not follow commands for testing Gait: Unable to assess   Lab Results: Basic Metabolic Panel: No results for input(s): NA, K, CL, CO2, GLUCOSE, BUN, CREATININE, CALCIUM, MG, PHOS in the last 168 hours.  CBC: No results for input(s): WBC, NEUTROABS, HGB, HCT, MCV, PLT in the last 168 hours.  Cardiac Enzymes: No results for input(s): CKTOTAL, CKMB, CKMBINDEX, TROPONINI in the last 168 hours.  Lipid Panel: No results for input(s): CHOL, TRIG, HDL, CHOLHDL, VLDL, LDLCALC in the last 168 hours.  Imaging: No results found.   Assessment: 45 year old female with multiple medical comorbidities and unstable living conditions who was transferred to [facility] from OSH with encephalopathy that persisted despite management of HHS that she initially presented with on 3/8.  1. Exam reveals an encephalopathic patient with mild restlessness, but no definite agitation. Her sparse verbal output is almost completely unintelligible. She only follows about 20% of all simple commands, often requiring repetition.  2. MRI this AM: Note states that she was sent down for MRI, but no images are available. Question whether she was unable to tolerated this second attempt (first attempt at OSH was unsuccessful).   Recommendations: 1. EEG in AM (ordered) 2. Continue ceftriaxone and vancomycin for empiric  meningitis coverage.  3. Adding acyclovir for possible HSV encephalitis. Also will need ampicillin for possible Listeria as is debilitated putting her at higher risk. Pharmacy has been consulted for dosing.  4. ID consult 5. Will need a second attempt at LP under fluoroscopy. May need to be sedated for this.  6. Neurology will continue to follow with you    Electronically signed: Dr. Kerney Elbe 01/04/2021, 1:33 AM

## 2021-01-04 NOTE — Progress Notes (Signed)
Pt sent down for MRI

## 2021-01-04 NOTE — Consult Note (Signed)
WOC Nurse Consult Note: Patient receiving care in [facility] 3W30. Patient minimally responsive during my assessment. Reason for Consult: "pressure ulcers" Wound type: calluses on bilateral feet, and a full thickness wound inside of a large callus on the left plantar instep. Pressure Injury POA: Yes/No/NA Measurement: Wound on instep measures 1 cm x 1.5 cm x 0.3.  The wound bed and callus are brown in color Wound bed: All other calluses are darkened, but intact Drainage (amount, consistency, odor) brown on the instep gauze. Periwound: cold, but intact Dressing procedure/placement/frequency: Wash bilateral feet with soap and water. Thoroughly dry. Place heel foam dressings bilaterally. These can remain in place up to 3 days.  Wash the wound on the left instep, plantar surface of the foot with soap and water. Pat dry. Place a small piece of moist gauze into the wound, cover with dry gauze, tape in place.  Monitor the wound area(s) for worsening of condition such as: Signs/symptoms of infection,  Increase in size,  Development of or worsening of odor, Development of pain, or increased pain at the affected locations.  Notify the medical team if any of these develop.  Thank you for the consult.  [place] nurse will not follow at this time.  Please re-consult the [place] team if needed.  Val Riles, RN, MSN, CWOCN, CNS-BC, pager [phone]

## 2021-01-04 NOTE — Evaluation (Signed)
Clinical/Bedside Swallow Evaluation Patient Details  Name: [name] MRN: [id number] Date of Birth: [birth year]  Today's Date: 01/04/2021 Time: SLP Start Time (ACUTE ONLY): [id number] SLP Stop Time (ACUTE ONLY): 1550 SLP Time Calculation (min) (ACUTE ONLY): 12 min  Past Medical History:  Past Medical History:  Diagnosis Date  . Bipolar disorder ([place])   . Chronic combined systolic and diastolic CHF (congestive heart failure) ([place])    a. ECHO 04/23/14 LV EF: 45% - 50%,  mild LVH, diffuse hypokinesis. LV diastolic dysfunction, high ventricular filling pressure. Mild LA dilation.  . Cocaine abuse ([place])   . COPD (chronic obstructive pulmonary disease) ([place])   . Diabetes mellitus without complication ([place])   . Dyspnea   . GERD (gastroesophageal reflux disease)   . Heroin overdose ([place]) 10/2018  . Hypertension   . MRSA infection    arm wound  . Nicotine dependence with current use   . Seizures ([place])    last seizure March 2021; usual 1-2 per month   Past Surgical History:  Past Surgical History:  Procedure Laterality Date  . ABDOMINAL HYSTERECTOMY    . ACHILLES TENDON SURGERY Left 03/31/2020   Procedure: ACHILLES LENGTHENING;  Surgeon: Caroline More, DPM;  Location: [facility];  Service: Podiatry;  Laterality: Left;  . AMPUTATION Right 10/04/2020   Procedure: PARTIAL AMPUTATION RAY 4th and 5th;  Surgeon: Caroline More, DPM;  Location: [facility];  Service: Podiatry;  Laterality: Right;  . AMPUTATION TOE Left 03/23/2019   Procedure: AMPUTATION TOE GREAT LEFT TOE;  Surgeon: [name] Patricia, DPM;  Location: [facility];  Service: Podiatry;  Laterality: Left;  . AMPUTATION TOE Left 08/16/2019   Procedure: AMPUTATION TOE 5th;  Surgeon: Caroline More, DPM;  Location: [facility];  Service: Podiatry;  Laterality: Left;  . AMPUTATION TOE Left 02/05/2020   Procedure: AMPUTATION MPJ LEFT 4TH;  Surgeon: Caroline More, DPM;  Location: [facility];  Service: Podiatry;  Laterality: Left;  . ANKLE FRACTURE SURGERY  Left    pins/screws in place  . CHOLECYSTECTOMY  2005  . FRACTURE SURGERY    . IRRIGATION AND DEBRIDEMENT FOOT Left 10/04/2020   Procedure: IRRIGATION AND DEBRIDEMENT FOOT;  Surgeon: Caroline More, DPM;  Location: [facility];  Service: Podiatry;  Laterality: Left;  . LOWER EXTREMITY ANGIOGRAPHY Left 03/12/2019   Procedure: LOWER EXTREMITY ANGIOGRAPHY;  Surgeon: Katha Cabal, MD;  Location: [place] [facility];  Service: Cardiovascular;  Laterality: Left;  . LOWER EXTREMITY ANGIOGRAPHY Right 10/06/2020   Procedure: Lower Extremity Angiography;  Surgeon: Katha Cabal, MD;  Location: [place] [facility];  Service: Cardiovascular;  Laterality: Right;  . TEE WITHOUT CARDIOVERSION N/A 03/26/2019   Procedure: TRANSESOPHAGEAL ECHOCARDIOGRAM (TEE);  Surgeon: Nelva Bush, MD;  Location: [facility];  Service: Cardiovascular;  Laterality: N/A;  . TRANSMETATARSAL AMPUTATION Left 03/31/2020   Procedure: TRANSMETATARSAL AMPUTATION;  Surgeon: Caroline More, DPM;  Location: [facility];  Service: Podiatry;  Laterality: Left;  . TUBAL LIGATION     HPI:  45 year old female with extensive medical problems including chronic gastric heart failure, type 2 diabetes on insulin, hypertension, COPD, GERD, seizure disorder, bipolar disorder, history of substance abuse, sensory neuropathy who was admitted to [facility], then transferred to [facility] with neurology consultation. Dx metabolic encephalopathy, LP ruled out bacterial meningitis, E. coli bacteremia, hypernatremia, AKI.   Assessment / Plan / Recommendation Clinical Impression  Pt quite lethargic, but could be aroused briefly for participation. Mother at bedside.  Pt did not f/c nor vocalize during session;  she alerted at the offer of thin liquids, opened her eyes and reached for the cup.  She required intermittent cues to suck from the straw and accept purees from the spoon.  There was adequate recognition, oral manipulation, and palpable  swallow response.  Pt generally protected her airway well - at end of session she became sleepier and swallow efficiency appeared to be less stable with mild coughing evident. Recommend starting a dysphagia 1 diet for now; thin liquids; give meds whole in puree. SLP will follow for safety/diet progression. Discussed recommendations with her mother, emphasizing the importance of NOT FEEDING pt when she is lethargic.  She verbalized understanding. SLP Visit Diagnosis: Dysphagia, oropharyngeal phase (R13.12)    Aspiration Risk  Mild aspiration risk    Diet Recommendation   dysphagia 1, thin liquids  Medication Administration: Whole meds with puree    Other  Recommendations Oral Care Recommendations: Oral care BID   Follow up Recommendations Other (comment) (tba)      Frequency and Duration min 2x/week  2 weeks       Prognosis Prognosis for Safe Diet Advancement: Good      Swallow Study   General Date of Onset: 01/03/21 HPI: 45 year old female with extensive medical problems including chronic gastric heart failure, type 2 diabetes on insulin, hypertension, COPD, GERD, seizure disorder, bipolar disorder, history of substance abuse, sensory neuropathy who was admitted to [facility], then transferred to [facility] with neurology consultation. Dx metabolic encephalopathy, LP ruled out bacterial meningitis, E. coli bacteremia, hypernatremia, AKI. Type of Study: Bedside Swallow Evaluation Previous Swallow Assessment: no Diet Prior to this Study: NPO Temperature Spikes Noted: No Respiratory Status: Room air History of Recent Intubation: No Behavior/Cognition: Lethargic/Drowsy Oral Cavity Assessment: Within Functional Limits Oral Care Completed by SLP: Recent completion by staff Oral Cavity - Dentition: Edentulous Vision: Functional for self-feeding Self-Feeding Abilities: Needs assist Patient Positioning: Upright in bed Baseline Vocal Quality: Not observed Volitional  Cough: Cognitively unable to elicit Volitional Swallow: Unable to elicit    Oral/Motor/Sensory Function Overall Oral Motor/Sensory Function: Other (comment) (no f/c)   Ice Chips Ice chips: Not tested   Thin Liquid Thin Liquid: Within functional limits Presentation: Cup;Straw    Nectar Thick Nectar Thick Liquid: Not tested   Honey Thick Honey Thick Liquid: Not tested   Puree Puree: Within functional limits   Solid     Solid: Not tested     Estill Bamberg L. Tivis Ringer, [place] Office number [phone] Pager [phone]  Juan Quam Laurice 01/04/2021,3:55 PM

## 2021-01-04 NOTE — Progress Notes (Signed)
Went back to examine patient with mother at the bedside . She was awake on commands . Her mom fed her a whole cup of applesauce and she took it without any problems. She will open her mouth wide and looked like she is hungry. Edentulous and missing dentures.   Plan: Hold off on tube feeding today. Moved too much in MRI, will plan for tomorrow rather then giving more sedation today as she is waking up. Speech therapy consult. Start regular diet with supervision after speech evaluation.  Spinal tap not consistent with bacterial meningitis. Neck exam with no rigidity.

## 2021-01-04 NOTE — Plan of Care (Signed)
  Problem: Education: Goal: Expressions of having a comfortable level of knowledge regarding the disease process will increase Outcome: Progressing   Problem: Coping: Goal: Ability to adjust to condition or change in health will improve Outcome: Progressing Goal: Ability to identify appropriate support needs will improve Outcome: Progressing   Problem: Health Behavior/Discharge Planning: Goal: Compliance with prescribed medication regimen will improve Outcome: Progressing   Problem: Medication: Goal: Risk for medication side effects will decrease Outcome: Progressing   Problem: Clinical Measurements: Goal: Complications related to the disease process, condition or treatment will be avoided or minimized Outcome: Progressing Goal: Diagnostic test results will improve Outcome: Progressing   Problem: Safety: Goal: Verbalization of understanding the information provided will improve Outcome: Progressing   Problem: Self-Concept: Goal: Level of anxiety will decrease Outcome: Progressing Goal: Ability to verbalize feelings about condition will improve Outcome: Progressing   Problem: Education: Goal: Knowledge of General Education information will improve Description: Including pain rating scale, medication(s)/side effects and non-pharmacologic comfort measures Outcome: Progressing   Problem: Health Behavior/Discharge Planning: Goal: Ability to manage health-related needs will improve Outcome: Progressing   Problem: Clinical Measurements: Goal: Ability to maintain clinical measurements within normal limits will improve Outcome: Progressing Goal: Will remain free from infection Outcome: Progressing Goal: Diagnostic test results will improve Outcome: Progressing Goal: Respiratory complications will improve Outcome: Progressing Goal: Cardiovascular complication will be avoided Outcome: Progressing   Problem: Activity: Goal: Risk for activity intolerance will  decrease Outcome: Progressing   Problem: Nutrition: Goal: Adequate nutrition will be maintained Outcome: Progressing   Problem: Coping: Goal: Level of anxiety will decrease Outcome: Progressing   Problem: Elimination: Goal: Will not experience complications related to bowel motility Outcome: Progressing Goal: Will not experience complications related to urinary retention Outcome: Progressing   Problem: Pain Managment: Goal: General experience of comfort will improve Outcome: Progressing   Problem: Safety: Goal: Ability to remain free from injury will improve Outcome: Progressing   Problem: Skin Integrity: Goal: Risk for impaired skin integrity will decrease Outcome: Progressing   

## 2021-01-04 NOTE — Procedures (Signed)
Patient Name: MYNA VAUGHN  MRN: [id number]  Epilepsy Attending: Lora Havens  Referring Physician/Provider: Dr. Kerney Elbe Date: 01/04/2021 Duration: 24.13 mins  Patient history: 45 year old female with altered mental status.  EEG to evaluate for seizures.  Level of alertness: Awake  AEDs during EEG study: None  Technical aspects: This EEG study was done with scalp electrodes positioned according to the 10-20 International system of electrode placement. Electrical activity was acquired at a sampling rate of '500Hz'$  and reviewed with a high frequency filter of '70Hz'$  and a low frequency filter of '1Hz'$ . EEG data were recorded continuously and digitally stored.   Description: The posterior dominant rhythm consists of 8-9 Hz activity of moderate voltage (25-35 uV) seen predominantly in posterior head regions, symmetric and reactive to eye opening and eye closing. EEG showed intermittent generalized 3 to 6 Hz theta-delta slowing. Hyperventilation and photic stimulation were not performed.     ABNORMALITY -Intermittent slow, generalized  IMPRESSION: This study is suggestive of mild diffuse encephalopathy, nonspecific etiology. No seizures or epileptiform discharges were seen throughout the recording.  Caio Devera Barbra Sarks

## 2021-01-04 NOTE — Progress Notes (Signed)
redness

## 2021-01-04 NOTE — Progress Notes (Signed)
Pharmacy Antibiotic Note  [name] is a 45 y.o. female admitted on 01/04/2021 with rule out meningitis.  Pharmacy has been consulted for Acyclovir&Ampicillin dosing. Already on Vancomycin&Ceftriaxone. WBC WNL. Noted renal dysfunction requiring dosing frequency adjustments.   Plan: Acyclovir 750 mg IV q12h Ampicillin 2g IV q6h Trend WBC, temp, renal function  F/U infectious work-up  Temp (24hrs), Avg:98.9 F (37.2 C), Min:98.2 F (36.8 C), Max:99.5 F (37.5 C)  Recent Labs  Lab 01/04/21 0313  WBC 9.7  CREATININE 2.88*    CrCl cannot be calculated (Unknown ideal weight.).    Allergies  Allergen Reactions  . Albumin (Human)     unknown  . Erythromycin Rash  . Midecamycin Rash    Narda Bonds, PharmD, BCPS Clinical Pharmacist Phone: [phone]

## 2021-01-05 DIAGNOSIS — B9620 Unspecified Escherichia coli [E. coli] as the cause of diseases classified elsewhere: Secondary | ICD-10-CM | POA: Diagnosis not present

## 2021-01-05 LAB — CBC WITH DIFFERENTIAL/PLATELET
Abs Immature Granulocytes: 0.03 10*3/uL (ref 0.00–0.07)
Basophils Absolute: 0 10*3/uL (ref 0.0–0.1)
Basophils Relative: 0 %
Eosinophils Absolute: 0.1 10*3/uL (ref 0.0–0.5)
Eosinophils Relative: 2 %
HCT: 25.6 % — ABNORMAL LOW (ref 36.0–46.0)
Hemoglobin: 7.8 g/dL — ABNORMAL LOW (ref 12.0–15.0)
Immature Granulocytes: 1 %
Lymphocytes Relative: 15 %
Lymphs Abs: 0.9 10*3/uL (ref 0.7–4.0)
MCH: 27.2 pg (ref 26.0–34.0)
MCHC: 30.5 g/dL (ref 30.0–36.0)
MCV: 89.2 fL (ref 80.0–100.0)
Monocytes Absolute: 0.5 10*3/uL (ref 0.1–1.0)
Monocytes Relative: 7 %
Neutro Abs: 4.8 10*3/uL (ref 1.7–7.7)
Neutrophils Relative %: 75 %
Platelets: 119 10*3/uL — ABNORMAL LOW (ref 150–400)
RBC: 2.87 MIL/uL — ABNORMAL LOW (ref 3.87–5.11)
RDW: 17.6 % — ABNORMAL HIGH (ref 11.5–15.5)
WBC: 6.3 10*3/uL (ref 4.0–10.5)
nRBC: 0 % (ref 0.0–0.2)

## 2021-01-05 LAB — GLUCOSE, CAPILLARY
Glucose-Capillary: 154 mg/dL — ABNORMAL HIGH (ref 70–99)
Glucose-Capillary: 178 mg/dL — ABNORMAL HIGH (ref 70–99)
Glucose-Capillary: 211 mg/dL — ABNORMAL HIGH (ref 70–99)
Glucose-Capillary: 235 mg/dL — ABNORMAL HIGH (ref 70–99)
Glucose-Capillary: 270 mg/dL — ABNORMAL HIGH (ref 70–99)
Glucose-Capillary: 290 mg/dL — ABNORMAL HIGH (ref 70–99)

## 2021-01-05 LAB — HSV 1/2 PCR, CSF
HSV-1 DNA: NEGATIVE
HSV-2 DNA: NEGATIVE

## 2021-01-05 LAB — COMPREHENSIVE METABOLIC PANEL
ALT: 10 U/L (ref 0–44)
AST: 10 U/L — ABNORMAL LOW (ref 15–41)
Albumin: 1.3 g/dL — ABNORMAL LOW (ref 3.5–5.0)
Alkaline Phosphatase: 62 U/L (ref 38–126)
Anion gap: 4 — ABNORMAL LOW (ref 5–15)
BUN: 42 mg/dL — ABNORMAL HIGH (ref 6–20)
CO2: 21 mmol/L — ABNORMAL LOW (ref 22–32)
Calcium: 7.8 mg/dL — ABNORMAL LOW (ref 8.9–10.3)
Chloride: 125 mmol/L — ABNORMAL HIGH (ref 98–111)
Creatinine, Ser: 2.78 mg/dL — ABNORMAL HIGH (ref 0.44–1.00)
GFR, Estimated: 21 mL/min — ABNORMAL LOW (ref >60)
Glucose, Bld: 209 mg/dL — ABNORMAL HIGH (ref 70–99)
Potassium: 4 mmol/L (ref 3.5–5.1)
Sodium: 150 mmol/L — ABNORMAL HIGH (ref 135–145)
Total Bilirubin: 0.6 mg/dL (ref 0.3–1.2)
Total Protein: 4.9 g/dL — ABNORMAL LOW (ref 6.5–8.1)

## 2021-01-05 LAB — AMMONIA: Ammonia: 20 umol/L (ref 9–35)

## 2021-01-05 LAB — MAGNESIUM: Magnesium: 1.6 mg/dL — ABNORMAL LOW (ref 1.7–2.4)

## 2021-01-05 LAB — CYTOLOGY - NON PAP

## 2021-01-05 LAB — PHOSPHORUS: Phosphorus: 3.4 mg/dL (ref 2.5–4.6)

## 2021-01-05 LAB — VDRL, CSF: VDRL Quant, CSF: NONREACTIVE

## 2021-01-05 MED ORDER — SODIUM CHLORIDE 0.45 % IV SOLN: INTRAVENOUS | Status: DC

## 2021-01-05 MED ORDER — GERHARDT'S BUTT CREAM
TOPICAL_CREAM | CUTANEOUS | Status: DC | PRN
  Administered 2021-01-05: 1 via TOPICAL
  Filled 2021-01-05: qty 1

## 2021-01-05 MED ORDER — LORAZEPAM 2 MG/ML IJ SOLN: 1.0000 mg | Freq: Once | INTRAMUSCULAR | Status: DC | PRN

## 2021-01-05 MED ORDER — SODIUM CHLORIDE 0.9 % IV BOLUS
1000.0000 mL | Freq: Once | INTRAVENOUS | Status: AC
  Administered 2021-01-05: 1000 mL via INTRAVENOUS

## 2021-01-05 MED ORDER — SODIUM CHLORIDE 0.9 % IV SOLN: INTRAVENOUS | Status: DC

## 2021-01-05 MED ORDER — INSULIN ASPART 100 UNIT/ML ~~LOC~~ SOLN
4.0000 [IU] | Freq: Three times a day (TID) | SUBCUTANEOUS | Status: DC
  Administered 2021-01-05 – 2021-01-12 (×9): 4 [IU] via SUBCUTANEOUS

## 2021-01-05 MED ORDER — SODIUM CHLORIDE 0.9 % IV SOLN
2.0000 g | INTRAVENOUS | Status: AC
  Administered 2021-01-05 – 2021-01-08 (×4): 2 g via INTRAVENOUS
  Filled 2021-01-05 (×4): qty 20

## 2021-01-05 MED ORDER — INSULIN GLARGINE 100 UNIT/ML ~~LOC~~ SOLN
10.0000 [IU] | Freq: Every day | SUBCUTANEOUS | Status: DC
  Administered 2021-01-05: 10 [IU] via SUBCUTANEOUS
  Filled 2021-01-05 (×3): qty 0.1

## 2021-01-05 NOTE — Progress Notes (Signed)
Pt VSS stable after receiving ordered bolus and fluid. MD notified of new VS. Will continue to monitor closely. Francis Gaines Lindsey Demonte RN   01/05/21 0341  Vitals  BP 93/61  MAP (mmHg) 71  BP Location Right Arm  BP Method Automatic  Patient Position (if appropriate) Lying  Pulse Rate 74  Pulse Rate Source Monitor  MEWS COLOR  MEWS Score Color Green  Pain Assessment  Pain Scale 0-10  Pain Score 10  MEWS Score  MEWS Temp 0  MEWS Systolic 1  MEWS Pulse 0  MEWS RR 0  MEWS LOC 0  MEWS Score 1

## 2021-01-05 NOTE — Progress Notes (Addendum)
Initial Nutrition Assessment  DOCUMENTATION CODES:   Obesity unspecified  INTERVENTION:   48 hour calorie count  Staff to assist pt with meals/supplements (if pt's mother is not available)  Transition pt to not appropriate for room service  Ensure Enlive po TID, each supplement provides 350 kcal and 20 grams of protein  Magic cup TID with meals, each supplement provides 290 kcal and 9 grams of protein  MVI with minerals daily  If pt's po intake does not improve, recommend placement of Cortrak and initiation of TF. Consider:  -Osmolite 1.5 @ 45m/hr, advance by 151mhr Q6H until goal rate of 6091mr is reached (1440m79m45ml5msource TF BID At goal, recommended TF would provide 2240 kcals, 112 grams protein, 1097ml 20m water  NUTRITION DIAGNOSIS:   Inadequate oral intake related to lethargy/confusion as evidenced by meal completion < 50%.    GOAL:   Patient will meet greater than or equal to 90% of their needs    MONITOR:   PO intake,Supplement acceptance,Diet advancement,Skin,Weight trends,Labs,I & O's  REASON FOR ASSESSMENT:   New TF    ASSESSMENT:    Pt admitted with acute multifactorial encephalopathy. PMH includes CHF, type 2 DM, PVD, HTN, COPD, GERD, seizure disorder, bipolar disorder, h/o substance abuse, sensory neuropathy.  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  Note pt screened for RD assessment due to appearing on enteral/supplement report. RD had initially been consulted by MD to initiate/manage TF, but consult was later discontinued by MD. Pt does have TF protocol ordered (Osmolite 1.2 @ goal of 55ml/h61mbut it has not yet been initiated. Per MD note, plan is to hold off on TF as pt was tolerating PO intake yesterday and was able to be placed on a dysphagia 1 diet with thin liquids s/p SLP evaluation. Today, MD notes pt is without reliable oral intake. Note 2 meals documented as 10-25% meal completion, which is inadequate to meet  pt's nutritional needs. Sent secure chat to MD requesting approval to initiate TF given initial consult was discontinued. MD would like to hold off on TF for another 24 hours as pt's mother has reported that pt's mentation and intake are beginning to improve. Pt's mother reports that pt is getting closer to her baseline. Will order calorie count and oral nutrition supplements to assess adequacy of pt's PO intake.   Pt unavailable at time of RD visit.   Reviewed wt history. No significant weight changes noted.   No UOP documented.  Medications:4 units novolog TID w/ meals, 10 units lantus BID, IV abx IVF: 1/2 NS @ 100ml/hr22ms: Cr 2.54 (H, lower than yesterday) CBGs [id number]:   Diet Order            DIET - DYS 1 Room service appropriate? No; Fluid consistency: Thin  Diet effective now                 EDUCATION NEEDS:   Not appropriate for education at this time  Skin:  Skin Assessment: Skin Integrity Issues: Skin Integrity Issues:: Stage II,Other (Comment) Stage II: bilateral heels Other: MASD perineum  Last BM:  3/15  Height:   Ht Readings from Last 1 Encounters:  01/04/21 '5\' 9"'$  (1.753 m)    Weight:   Wt Readings from Last 1 Encounters:  01/05/21 96.9 kg    Ideal Body Weight:  65.9 kg  BMI:  Body mass index is 31.55 kg/m.  Estimated Nutritional Needs:   Kcal:  2100-2300  Protein:  105-130 grams  Fluid:  >2L    Larkin Ina, MS, RD, LDN RD pager number and weekend/on-call pager number located in [place].

## 2021-01-05 NOTE — Progress Notes (Signed)
  Speech Language Pathology Treatment: Dysphagia  Patient Details Name: [name] MRN: [id number] DOB: Jul 25, 1976 Today's Date: 01/05/2021 Time: [id number] SLP Time Calculation (min) (ACUTE ONLY): 16 min  Assessment / Plan / Recommendation Clinical Impression  Pt was more alert and participatory today than yesterday, following commands and responding to questions verbally.  She was able to hold her water cup with assist and drank six ounces without difficulty.  She consumed 4 oz of pudding with slowed mastication and some groping movements of tongue/lips, however it was functional.  For now, continue with dysphagia 1 diet for ease of management; thin liquids; meds whole in puree.  Anticipate advancement of diet quickly.    HPI HPI: 45 year old female with extensive medical problems including chronic gastric heart failure, type 2 diabetes on insulin, hypertension, COPD, GERD, seizure disorder, bipolar disorder, history of substance abuse, sensory neuropathy who was admitted to [facility], then transferred to [facility] with neurology consultation. Dx metabolic encephalopathy, E. coli bacteremia, hypernatremia, AKI. W/u is ongoing and pt is being followed by neuro.      SLP Plan  Continue with current plan of care       Recommendations  Diet recommendations: Dysphagia 1 (puree);Thin liquid Liquids provided via: Cup;Straw Medication Administration: Whole meds with puree Supervision: Staff to assist with self feeding Postural Changes and/or Swallow Maneuvers: Seated upright 90 degrees                Oral Care Recommendations: Oral care BID Follow up Recommendations: Other (comment) (tba) SLP Visit Diagnosis: Dysphagia, oropharyngeal phase (R13.12) Plan: Continue with current plan of care       [place]. [name], [place] CCC/SLP Acute Rehabilitation Services Office number [phone] Pager [phone]  [name] 01/05/2021,  11:21 AM

## 2021-01-05 NOTE — Evaluation (Signed)
Physical Therapy Evaluation Patient Details Name: [name] MRN: [id number] DOB: 06/11/1976 Today's Date: 01/05/2021   History of Present Illness  Pt adm from [facility] on [id number] with metabolic encephalopathy. 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 Pt with improved mentation over the past 24 hours per notes and family. Expect pt will make steady progress. Mother supportive and has provided assistance to pt in the past. Recommend CIR for further rehab.     Follow Up Recommendations CIR    Equipment Recommendations  None recommended by PT    Recommendations for Other Services       Precautions / Restrictions        Mobility  Bed Mobility Overal bed mobility: Needs Assistance Bed Mobility: Rolling;Sidelying to Sit;Sit to Sidelying Rolling: Mod assist Sidelying to sit: Max assist;HOB elevated     Sit to sidelying: Mod assist;HOB elevated General bed mobility comments: Assist to bring legs off of bed, elevate trunk into sitting, and bring hips to EOB. Assist to lower trunk and bring legs up into bed to return sidelying.    Transfers                 General transfer comment: Attempted sit to stand but unable to bring hips off of bed with 1 person assist.  Ambulation/Gait                Stairs            Wheelchair Mobility    Modified Rankin (Stroke Patients Only)       Balance Overall balance assessment: Needs assistance Sitting-balance support: Bilateral upper extremity supported;Feet supported Sitting balance-Leahy Scale: Poor Sitting balance - Comments: UE support for static sitting                                     Pertinent Vitals/Pain Pain Assessment: No/denies pain     Home Living Family/patient expects to be discharged to:: Private residence Living Arrangements: Other relatives (cousing) Available Help at Discharge: Family;Available 24 hours/day Type of Home: House Home Access: Ramped entrance     Home Layout: One level Home Equipment: Walker - 4 wheels;Bedside commode;Shower seat;Wheelchair - manual (drop arm commode)      Prior Function Level of Independence: Needs assistance   Gait / Transfers Assistance Needed: Using rollator or w/c with supervision           Hand Dominance   Dominant Hand: Right    Extremity/Trunk Assessment   Upper Extremity Assessment Upper Extremity Assessment: Defer to OT evaluation    Lower Extremity Assessment Lower Extremity Assessment: Generalized weakness;RLE deficits/detail;LLE deficits/detail RLE Coordination: decreased gross motor LLE Coordination: decreased gross motor       Communication      Cognition Arousal/Alertness: Awake/alert Behavior During Therapy: WFL for tasks assessed/performed Overall Cognitive Status: Impaired/Different from baseline Area of Impairment: Orientation;Attention;Memory;Following commands;Safety/judgement;Problem solving;Awareness                 Orientation Level: Disoriented to;Place;Situation Current Attention Level: Sustained Memory: Decreased short-term memory Following Commands: Follows one step commands with increased time Safety/Judgement: Decreased awareness of safety;Decreased awareness of deficits Awareness: Intellectual Problem Solving: Slow processing;Decreased initiation;Requires verbal cues;Requires tactile cues;Difficulty sequencing  General Comments      Exercises     Assessment/Plan    PT Assessment Patient needs continued PT services  PT Problem List Decreased strength;Decreased activity tolerance;Decreased balance;Decreased mobility;Decreased cognition;Decreased safety awareness;Obesity       PT Treatment  Interventions DME instruction;Gait training;Functional mobility training;Therapeutic activities;Therapeutic exercise;Balance training;Neuromuscular re-education;Cognitive remediation;Patient/family education;Wheelchair mobility training    PT Goals (Current goals can be found in the Care Plan section)  Acute Rehab PT Goals Patient Stated Goal: Pt didn't state but agreeable to work to return to incr mobility PT Goal Formulation: With patient/family Time For Goal Achievement: 01/19/21 Potential to Achieve Goals: Good    Frequency Min 3X/week   Barriers to discharge        Co-evaluation               AM-PAC PT "6 Clicks" Mobility  Outcome Measure Help needed turning from your back to your side while in a flat bed without using bedrails?: A Lot Help needed moving from lying on your back to sitting on the side of a flat bed without using bedrails?: A Lot Help needed moving to and from a bed to a chair (including a wheelchair)?: Total Help needed standing up from a chair using your arms (e.g., wheelchair or bedside chair)?: Total Help needed to walk in hospital room?: Total Help needed climbing 3-5 steps with a railing? : Total 6 Click Score: 8    End of Session Equipment Utilized During Treatment: Gait belt Activity Tolerance: Patient tolerated treatment well Patient left: in bed;with call bell/phone within reach;with bed alarm set;with family/visitor present   PT Visit Diagnosis: Other abnormalities of gait and mobility (R26.89);Muscle weakness (generalized) (M62.81)    Time: [id number] PT Time Calculation (min) (ACUTE ONLY): 31 min   Charges:   PT Evaluation $PT Eval Moderate Complexity: 1 Mod PT Treatments $Therapeutic Activity: 8-22 mins        [place] Pager [phone] Office [place] 01/05/2021, 5:16 PM

## 2021-01-05 NOTE — Progress Notes (Signed)
Pt sleeping and remains drowsy but response to name and touch. MD notified of pt VSS and new orders received. Delia Heady RN   01/05/21 0229  Vitals  BP (!) 87/53  MAP (mmHg) (!) 63  BP Location Right Arm  BP Method Automatic  Patient Position (if appropriate) Lying  Pulse Rate 71  Pulse Rate Source Monitor  MEWS COLOR  MEWS Score Color Green  MEWS Score  MEWS Temp 0  MEWS Systolic 1  MEWS Pulse 0  MEWS RR 0  MEWS LOC 0  MEWS Score 1

## 2021-01-05 NOTE — Evaluation (Signed)
Occupational Therapy Evaluation Patient Details Name: [name] MRN: [id number] DOB: 1976/06/04 Today's Date: 01/05/2021    History of Present Illness Pt adm from [facility] on [id number] with metabolic encephalopathy. 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 Pt currently, B/L UEs and LEs weakness, poor ability to keep head upright. Pt limited by decreased strength, decreased trunk stability, decreased ability to care for self and increased need for assistance for mobility. Pt's mom in room assisting with pt's drinks and NT came in and BS was 290. Education to drink water instead of soda/tea. Pt totalA for ADL, unable to hold/grip items;lift for staff for OOB activity.  BP 118/76 at EOB, O2 >90% On RA. Pt would benefit from continued OT skilled services. OT following acutely. Pt very motivated to return to PLOF and would benefit from CIR consult to return to increased independence prior to home.     Follow Up Recommendations  CIR    Equipment Recommendations  3 in 1 bedside commode;Hospital bed;Wheelchair (measurements OT);Wheelchair cushion (measurements OT)    Recommendations for Other Services Rehab consult     Precautions / Restrictions Precautions Precautions: Fall Restrictions Weight Bearing Restrictions: No      Mobility Bed Mobility Overal bed mobility: Needs Assistance Bed Mobility: Rolling;Sidelying to Sit;Sit to Sidelying Rolling: Mod assist Sidelying to sit: Max assist;HOB elevated     Sit to sidelying: Mod assist;HOB elevated General bed mobility comments: Assist to bring BLEs off bed, elevate trunk, and bring hips to EOB. Assist to lower trunk and bring legs up into bed to return  sidelying. Cues for hand placement    Transfers                 General transfer comment: Pt very unsteady at EOB, nearly flopping BUEs on bed and difficulty keeping head up.    Balance Overall balance assessment: Needs assistance Sitting-balance support: Bilateral upper extremity supported;Feet supported Sitting balance-Leahy Scale: Poor Sitting balance - Comments: UE support and trunk support to reduce leaning to R and neck favoring R side                                   ADL either performed or assessed with clinical judgement   ADL Overall ADL's : Needs assistance/impaired Eating/Feeding: Total assistance Eating/Feeding Details (indicate cue type and reason): unable to bring hands to mouth for self feeding; unable to grip items Grooming: Total assistance Grooming Details (indicate cue type and reason): poor strength/grip Upper Body Bathing: Total assistance;Bed level   Lower Body Bathing: Total assistance;Sitting/lateral leans;Bed level   Upper Body Dressing : Total assistance;Bed level;Sitting   Lower Body Dressing: Maximal assistance;Sitting/lateral leans;Bed level     Toilet Transfer Details (indicate cue type and reason): unable to test; would need lift Toileting- Clothing Manipulation and Hygiene: Total assistance Toileting - Clothing Manipulation Details (indicate cue type and reason): catheter/bed pan     Functional mobility during ADLs: Maximal assistance General ADL Comments: Pt limited by decreased strength, decreased trunk stability, decreased ability to care for self and increased need for assistance for mobility. Pt's mom in room assisting with pt's drinks and NT came in  and BS was 290. Education to drink water instead of soda/tea.     Vision Baseline Vision/History: Wears glasses Patient Visual Report: No change from baseline;Other (comment) (does not have glasses with her) Vision Assessment?: No apparent visual deficits      Perception     Praxis      Pertinent Vitals/Pain Pain Assessment: No/denies pain     Hand Dominance Right   Extremity/Trunk Assessment Upper Extremity Assessment Upper Extremity Assessment: Generalized weakness;RUE deficits/detail;LUE deficits/detail RUE Deficits / Details: 2-/5 MM grade; shoulder unable to sustain against gravity 3+/5 elbow flex/ext with isometric exercise; poor grip 2/5 RUE Coordination: decreased fine motor LUE Deficits / Details: 2-/5 MM grade; shoulder unable to sustain against gravity 3+/5 elbow flex/ext with isometric exercise; poor grip 2/5 LUE Coordination: decreased fine motor   Lower Extremity Assessment Lower Extremity Assessment: Generalized weakness;Defer to PT evaluation RLE Coordination: decreased gross motor LLE Coordination: decreased gross motor   Cervical / Trunk Assessment Cervical / Trunk Assessment: Other exceptions Cervical / Trunk Exceptions: head forward posture   Communication Communication Communication: Expressive difficulties;Other (comment) (mumbled speech)   Cognition Arousal/Alertness: Awake/alert Behavior During Therapy: WFL for tasks assessed/performed Overall Cognitive Status: Impaired/Different from baseline Area of Impairment: Orientation;Attention;Memory;Following commands;Safety/judgement;Problem solving;Awareness                 Orientation Level: Disoriented to;Place;Situation Current Attention Level: Sustained Memory: Decreased short-term memory Following Commands: Follows one step commands with increased time Safety/Judgement: Decreased awareness of safety;Decreased awareness of deficits Awareness: Intellectual Problem Solving: Slow processing;Decreased initiation;Requires verbal cues;Requires tactile cues;Difficulty sequencing General Comments: Pt requires direction repeated and unsure if it was slow processing or pt's mother interrupting her as she was trying to perform a task   General Comments  BP  118/76 at EOB, O2 >90% On RA.    Exercises     Shoulder Instructions      Home Living Family/patient expects to be discharged to:: Private residence Living Arrangements: Other relatives (cousing) Available Help at Discharge: Family;Available 24 hours/day Type of Home: House Home Access: Ramped entrance     Home Layout: One level     Bathroom Shower/Tub: Teacher, early years/pre: Standard Bathroom Accessibility: Yes   Home Equipment: Walker - 4 wheels;Bedside commode;Shower seat;Wheelchair - manual (drop arm commode)          Prior Functioning/Environment Level of Independence: Needs assistance  Gait / Transfers Assistance Needed: Using rollator or w/c with supervision ADL's / Homemaking Assistance Needed: Pt performing own ADL per pt and her mother Communication / Swallowing Assistance Needed: prefers to not use dentures          OT Problem List: Decreased strength;Decreased activity tolerance;Impaired balance (sitting and/or standing);Impaired vision/perception;Decreased coordination;Decreased cognition;Decreased safety awareness;Decreased knowledge of use of DME or AE;Impaired UE functional use;Pain;Increased edema;Obesity      OT Treatment/Interventions: Self-care/ADL training;Therapeutic exercise;Energy conservation;DME and/or AE instruction;Therapeutic activities;Cognitive remediation/compensation;Patient/family education;Balance training    OT Goals(Current goals can be found in the care plan section) Acute Rehab OT Goals Patient Stated Goal: Pt wanting to get better; motehr approves of post acute rehab OT Goal Formulation: With patient/family Time For Goal Achievement: 01/19/21 Potential to Achieve Goals: Good ADL Goals Pt Will Perform Eating: with min assist;with adaptive utensils;sitting Pt Will Perform Grooming: with min assist;sitting;with adaptive equipment Pt Will Perform Upper Body Dressing: with min assist;with adaptive equipment;sitting Pt  Will Transfer to Toilet: with mod assist;squat pivot transfer;bedside commode Pt/caregiver will Perform Home Exercise Program: Increased strength;Both right and  left upper extremity;With minimal assist;With written HEP provided Additional ADL Goal #1: Pt will increase to minguardA for bed mobility in prep for OOB ADL.  OT Frequency: Min 2X/week   Barriers to D/C: Decreased caregiver support  mother at home only; pt requires physical assist       Co-evaluation              AM-PAC OT "6 Clicks" Daily Activity     Outcome Measure Help from another person eating meals?: Total Help from another person taking care of personal grooming?: Total Help from another person toileting, which includes using toliet, bedpan, or urinal?: Total Help from another person bathing (including washing, rinsing, drying)?: Total Help from another person to put on and taking off regular upper body clothing?: Total Help from another person to put on and taking off regular lower body clothing?: Total 6 Click Score: 6   End of Session Nurse Communication: Mobility status  Activity Tolerance: Patient limited by fatigue Patient left: in bed;with call bell/phone within reach;with bed alarm set  OT Visit Diagnosis: Unsteadiness on feet (R26.81);Muscle weakness (generalized) (M62.81);Other symptoms and signs involving cognitive function;Hemiplegia and hemiparesis Hemiplegia - Right/Left:  (B/L UEs and LEs) Hemiplegia - dominant/non-dominant:  (B/L UEs and LEs) Hemiplegia - caused by: Unspecified                Time: 1638-1700 OT Time Calculation (min): 22 min Charges:  OT General Charges $OT Visit: 1 Visit OT Evaluation $OT Eval Moderate Complexity: 1 Mod  Jefferey Pica, OTR/L Acute Rehabilitation Services Pager: [phone] Office: [phone]   Erving Sassano C 01/05/2021, 5:51 PM

## 2021-01-05 NOTE — Progress Notes (Signed)
PROGRESS NOTE    [name]  [id number] DOB: September 25, 1976 DOA: 01/04/2021 PCP: [facility]    Brief Narrative:  45 year old female with extensive medical problems including chronic combined heart failure, type 2 diabetes on insulin, hypertension, COPD, GERD, seizure disorder, bipolar disorder, history of substance abuse, sensory neuropathy who was admitted to [facility] since 3/8 where she presented with confusion of about 1 day.  Patient was unable to provide any history.  Her mother who lives with her able to give history that she was at her usual self 2 days prior to admission.  At the outside hospital, she was found to be in HHS with blood sugars more than 1200.  Treated with insulin drip then subsequently changed to subcu insulin with no improvement of mentation.  She started spiking fever, blood culture reportedly with E. coli, urine culture not reported.  Treated with ceftriaxone.  With persistent encephalopathy despite treatment she was transferred to [facility] ([place] with neurology consultation.   Assessment & Plan:   Principal Problem:   Metabolic encephalopathy Active Problems:   Bipolar disorder, unspecified ([place])   DM type 2 (diabetes mellitus, type 2)- poorly controlled   Ulcer of great toe, left, with fat layer exposed ([place])   Essential hypertension   Chronic systolic CHF (congestive heart failure) ([facility])   PVD (peripheral vascular disease) ([place])   Acute kidney injury superimposed on CKD ([place])   Bacteremia due to Escherichia coli   Acute metabolic encephalopathy  Acute metabolic encephalopathy: Patient with underlying history of bipolar disorder.  This is probably multifactorial. HSS is corrected but mental status did not correct hence transferred to [facility]. Her mental status is gradually improving.  Patient is much more alert and able to follow commands today.  This is probably prolonged delirium. Acute bacterial meningitis ruled out.   Continue acyclovir until HSV available.   MRI brain pending.  No focal deficits. All-time fall precautions.  Delirium precautions.  Patient on multiple mental health medications, mood stabilizers.  Patient is on multiple medications including Seroquel at night, Resuming as below reduced doses of antipsychotics and antidepressants.  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  Patient already have source of infection with E. coli bacteremia and E. coli UTI.  Less likely CNS infection. Spinal tap was ordered with radiology, preliminary test results with 1 WBC and mildly elevated protein and sugars.  Opening pressures normal. Not consistent with bacterial meningitis.  Discontinue vancomycin and ampicillin.  Ceftriaxone for E. coli bacteremia total 10 days. Continue acyclovir until HSV 1/2 PCR available.  E. coli bacteremia: Blood cultures 3/8 , urine cultures 3/8 outside hospital with E. coli pansensitive.  Repeat cultures negative.  CT scan of the abdomen pelvis without any evidence of hydronephrosis or obstruction. Continue Rocephin, will treat total 10 days of therapy.  Repeat cultures done 3/13 on admission. Patient has Foley catheter in place, will remove once she is more awake.  Acute kidney injury with history of chronic kidney disease stage IIIa: On previous hospitalization  baseline creatinine has been 1.2-1.4.  Now with evidence of dehydration.  Start him on a saline.  Change to half-normal saline. Urine output is adequate.  Hyperchloremic hypernatremia: Due to total free water deficit.  IV fluids changed to half-normal saline and monitor levels closely.  Type 2 diabetes with HHS: Blood sugars fairly controlled.  Hemoglobin A1c is 14.   Patient without reliable oral intake.  Keep on reduced doses of insulin.   Chronic systolic heart failure: Compensated.  Holding diuretics.  Peripheral vascular disease/peripheral arterial stents: Mother reported that patient is off anticoagulation  now.  No evidence of active ischemia.  History of bipolar disorder/peripheral neuropathy: Patient on  amitriptyline 150 mg daily Gabapentin 600 mg 3 times a day Seroquel 800 mg at night Venlafaxine 150 mg daily. With altered mental status, all her medications were held.  Patient may go into withdrawals from SSRIs.  TCAs less likely to develop withdrawals.  She does not have any evidence of neuroleptic malignant syndrome. We will resume gabapentin one third of the dose, Seroquel half dose, resume venlafaxine.   Patient is slightly noncommunicating and following commands today. Will continue to encourage oral intake. MRI brain today. Start mobilizing with PT OT today.    DVT prophylaxis: heparin injection 5,000 Units Start: 01/04/21 0600   Code Status: Full code Family Communication: Mother at bedside 3/14. Disposition Plan: Status is: Inpatient  Remains inpatient appropriate because:IV treatments appropriate due to intensity of illness or inability to take PO and Inpatient level of care appropriate due to severity of illness   Dispo: The patient is from: Home              Anticipated d/c is to: SNF              Patient currently is not medically stable to d/c.   Difficult to place patient No         Consultants:   Neurology  Procedures:   Lumbar puncture  EEG  Antimicrobials:  Antibiotics Given (last 72 hours)    Date/Time Action Medication Dose Rate   01/04/21 0653 New Bag/Given   ampicillin (OMNIPEN) 2 g in sodium chloride 0.9 % 100 mL IVPB 2 g 300 mL/hr   01/04/21 0657 New Bag/Given   acyclovir (ZOVIRAX) 750 mg in dextrose 5 % 150 mL IVPB 750 mg 165 mL/hr   01/04/21 0945 New Bag/Given   cefTRIAXone (ROCEPHIN) 2 g in sodium chloride 0.9 % 100 mL IVPB 2 g 200 mL/hr   01/04/21 1301 New Bag/Given   ampicillin (OMNIPEN) 2 g in sodium chloride 0.9 % 100 mL IVPB 2 g 300 mL/hr   01/04/21 2151 New Bag/Given   cefTRIAXone (ROCEPHIN) 2 g in sodium chloride 0.9 %  100 mL IVPB 2 g 200 mL/hr   01/04/21 2152 New Bag/Given   acyclovir (ZOVIRAX) 750 mg in dextrose 5 % 150 mL IVPB 750 mg 165 mL/hr   01/05/21 1349 New Bag/Given   acyclovir (ZOVIRAX) 750 mg in dextrose 5 % 150 mL IVPB 750 mg 165 mL/hr         Subjective: Patient seen and examined.  Overnight events noted.  She was noted to have low blood pressure without change in mental status.  She has edematous arms.  Peripheral perfusion is adequate.  Given 1 L of normal saline bolus.  Repeat blood pressure examinations normal.  Urine output is 500 mL last 24 hours.  Objective: Vitals:   01/05/21 0423 01/05/21 0823 01/05/21 0845 01/05/21 1014  BP: (!) 93/55 (!) 78/52 (!) 82/58 109/69  Pulse: 71 84  92  Resp: '16 13  14  '$ Temp: 97.7 F (36.5 C) 97.8 F (36.6 C)    TempSrc: Oral Oral    SpO2: 100% 100%  100%  Weight:      Height:        Intake/Output Summary (Last 24 hours) at 01/05/2021 1355 Last data filed at 01/05/2021 0410 Gross per 24 hour  Intake 3173.7 ml  Output  500 ml  Net 2673.7 ml   Filed Weights   01/04/21 0808 01/05/21 0413  Weight: 95.7 kg 96.9 kg    Examination:  General exam: Appears calm.  Not in any distress.  Chronically sick looking.  Frail and debilitated. Today she is able to follow commands and move her extremities.  She was able to answer simple questions.  Able to hold a cup of water and drink herself. Respiratory system: Clear to auscultation. Respiratory effort normal.  No added sounds. Cardiovascular system: S1 & S2 heard, RRR.  No edema.. Gastrointestinal system: Abdomen is nondistended, soft and nontender. No organomegaly or masses felt. Normal bowel sounds heard. Central nervous system: Alert and awake on stimulation.  Most of the time sleepy.  Right foot lateral fourth and fifth toe amputation stump.  Clean and dry. Left foot with a plantar aspect ulcer, no evidence of infection.   Data Reviewed: I have personally reviewed following labs and imaging  studies  CBC: Recent Labs  Lab 01/04/21 0313 01/05/21 0149  WBC 9.7 6.3  NEUTROABS 7.9* 4.8  HGB 9.5* 7.8*  HCT 30.9* 25.6*  MCV 86.6 89.2  PLT 143* 123456*   Basic Metabolic Panel: Recent Labs  Lab 01/04/21 0313 01/05/21 0149  NA 150* 150*  K 4.2 4.0  CL 124* 125*  CO2 20* 21*  GLUCOSE 209* 209*  BUN 43* 42*  CREATININE 2.88* 2.78*  CALCIUM 8.2* 7.8*  MG  --  1.6*  PHOS  --  3.4   GFR: Estimated Creatinine Clearance: 31.7 mL/min (A) (by C-G formula based on SCr of 2.78 mg/dL (H)). Liver Function Tests: Recent Labs  Lab 01/04/21 0313 01/05/21 0149  AST 15 10*  ALT 11 10  ALKPHOS 88 62  BILITOT 0.3 0.6  PROT 5.9* 4.9*  ALBUMIN 1.6* 1.3*   No results for input(s): LIPASE, AMYLASE in the last 168 hours. No results for input(s): AMMONIA in the last 168 hours. Coagulation Profile: No results for input(s): INR, PROTIME in the last 168 hours. Cardiac Enzymes: No results for input(s): CKTOTAL, CKMB, CKMBINDEX, TROPONINI in the last 168 hours. BNP (last 3 results) No results for input(s): PROBNP in the last 8760 hours. HbA1C: Recent Labs    01/04/21 0320  HGBA1C 14.3*   CBG: Recent Labs  Lab 01/04/21 2023 01/04/21 2323 01/05/21 0331 01/05/21 0819 01/05/21 1217  GLUCAP 192* 177* 178* 154* 211*   Lipid Profile: No results for input(s): CHOL, HDL, LDLCALC, TRIG, CHOLHDL, LDLDIRECT in the last 72 hours. Thyroid Function Tests: No results for input(s): TSH, T4TOTAL, FREET4, T3FREE, THYROIDAB in the last 72 hours. Anemia Panel: No results for input(s): VITAMINB12, FOLATE, FERRITIN, TIBC, IRON, RETICCTPCT in the last 72 hours. Sepsis Labs: No results for input(s): PROCALCITON, LATICACIDVEN in the last 168 hours.  Recent Results (from the past 240 hour(s))  CSF culture w Gram Stain     Status: None (Preliminary result)   Collection Time: 01/04/21 11:07 AM   Specimen: PATH Cytology CSF; Cerebrospinal Fluid  Result Value Ref Range Status   Specimen  Description CSF  Final   Special Requests NONE  Final   Gram Stain   Final    WBC PRESENT, PREDOMINANTLY MONONUCLEAR NO ORGANISMS SEEN CYTOSPIN SMEAR    Culture   Final    NO GROWTH < 24 HOURS Performed at [facility], [place] [address] [place], [place] [address]    Report Status PENDING  Incomplete         Radiology Studies:  EEG adult  Result Date: 01/04/2021 Lora Havens, MD     01/04/2021 10:35 AM Patient Name: SELAH MAURI MRN: [id number] Epilepsy Attending: Lora Havens Referring Physician/Provider: Dr. Kerney Elbe Date: 01/04/2021 Duration: 24.13 mins Patient history: 45 year old female with altered mental status.  EEG to evaluate for seizures. Level of alertness: Awake AEDs during EEG study: None Technical aspects: This EEG study was done with scalp electrodes positioned according to the 10-20 International system of electrode placement. Electrical activity was acquired at a sampling rate of '500Hz'$  and reviewed with a high frequency filter of '70Hz'$  and a low frequency filter of '1Hz'$ . EEG data were recorded continuously and digitally stored. Description: The posterior dominant rhythm consists of 8-9 Hz activity of moderate voltage (25-35 uV) seen predominantly in posterior head regions, symmetric and reactive to eye opening and eye closing. EEG showed intermittent generalized 3 to 6 Hz theta-delta slowing. Hyperventilation and photic stimulation were not performed.   ABNORMALITY -Intermittent slow, generalized IMPRESSION: This study is suggestive of mild diffuse encephalopathy, nonspecific etiology. No seizures or epileptiform discharges were seen throughout the recording. Lora Havens   DG FL GUIDED LUMBAR PUNCTURE  Result Date: 01/04/2021 CLINICAL DATA:  Encephalopathy. EXAM: DIAGNOSTIC LUMBAR PUNCTURE UNDER FLUOROSCOPIC GUIDANCE COMPARISON:  None FLUOROSCOPY TIME:  Fluoroscopy Time:  0 minutes and 18 seconds. Radiation Exposure Index (if provided by the fluoroscopic  device): 4.50 mGy Number of Acquired Spot Images: 0 PROCEDURE: Informed consent was obtained from the patient's mother prior to the procedure, including potential complications of headache, allergy, and pain. With the patient prone, an appropriate site for lumbar puncture was marked on the patient's skin. The lower back was then prepped with Betadine. 1% Lidocaine was used for local anesthesia. Lumbar puncture was performed at the L3-4 level using a 20 gauge needle with return of clear CSF with an opening pressure of 20 cm water. 10 ml of CSF were obtained for laboratory studies. The patient tolerated the procedure well and there were no apparent complications. IMPRESSION: Fluoroscopic guided lumbar puncture with 10 cc of clear CSF obtained for appropriate laboratory evaluation. Electronically Signed   By: Marijo Sanes M.D.   On: 01/04/2021 12:18        Scheduled Meds:  Chlorhexidine Gluconate Cloth  6 each Topical Daily   gabapentin  200 mg Oral Q8H   heparin  5,000 Units Subcutaneous Q8H   insulin glargine  5 Units Subcutaneous QHS   metoprolol tartrate  12.5 mg Oral BID   QUEtiapine  400 mg Oral QHS   venlafaxine XR  75 mg Oral Daily   Continuous Infusions:  sodium chloride     acyclovir 750 mg (01/05/21 1349)   cefTRIAXone (ROCEPHIN)  IV     feeding supplement (OSMOLITE 1.2 CAL)       LOS: 1 day    Time spent: 35 minutes    Barb Merino, MD [facility] Pager [phone]

## 2021-01-05 NOTE — Progress Notes (Signed)
PHARMACY NOTE:  ANTIMICROBIAL RENAL DOSAGE ADJUSTMENT  Current antimicrobial regimen includes a mismatch between antimicrobial dosage and estimated renal function.  As per policy approved by the Pharmacy & Therapeutics and Medical Executive Committees, the antimicrobial dosage will be adjusted accordingly.  Current antimicrobial dosage:  Rocephin 2g IV every 12 hours  Indication: E.coli bacteremia  Renal Function:  Estimated Creatinine Clearance: 31.7 mL/min (A) (by C-G formula based on SCr of 2.78 mg/dL (H)). '[]'$      On intermittent HD, scheduled: '[]'$      On CRRT    Antimicrobial dosage has been changed to:  Rocephin 2g IV every 24 hours  Additional comments: Originally was covering for both bacteremia and concern for bacterial meningitis. LP not concerning for bacterial meningitis so will reduce dose down since CNS concentrations not warranted. Sent MD a message who okayed change and asked for stop date of 3/18.   Thank you for allowing pharmacy to be a part of this patient's care.  Alycia Rossetti, PharmD, BCPS Clinical Pharmacist Clinical phone for 01/05/2021: [phone] 01/05/2021 10:08 AM   **Pharmacist phone directory can now be found on [email or web address] (PW [email or web address]).  Listed under [place].

## 2021-01-05 NOTE — Progress Notes (Signed)
HOSPITAL MEDICINE OVERNIGHT EVENT NOTE    Notified by nursing that patient is beginning to exhibit downtrending blood pressures, last 2 sets of blood pressures with systolics in the [id number].  No changes in mentation, no fever.  MEWS is yellow.  Chart reviewed, patient presenting with altered mental status felt to be secondary to encephalopathy.  Etiology is still unclear.  Patient has gone extensive work-up up to this point.  Patient is also being treated for E. coli bacteremia with intravenous ceftriaxone.  Of note, nursing reports that patient has had extremely poor urine output on the shift suggesting patient is still volume down.  No change in antibiotics at this time.  Bolusing patient with 1 L of normal saline followed by transitioning maintenance fluids from half-normal to normal saline.  Monitoring blood pressure closely after bolus.  Heather Emerald  MD [facility]

## 2021-01-05 NOTE — Progress Notes (Signed)
Neurology Progress Note Requesting physician: Dr. Tonie Griffith  Reason for Consult: Encephalopahy  History obtained from:  Chart  and clinical team  Subjective: On exam this AM patient is drowsy but arousable and becomes more responsive over the course of the exam. Overnight there was concern for hypotension and patient received bolus of fluids to which she responded well. Per clinical team patient appears more alert today and calm. Patient does not communicate any pain. Patient does communicate that she is tired.    Exam: Vitals:   01/05/21 0823 01/05/21 0845  BP: (!) 78/52 (!) 82/58  Pulse: 84   Resp: 13   Temp: 97.8 F (36.6 C)   SpO2: 100%    Gen: In bed, NAD Resp: non-labored breathing, no acute distress Abd: soft, nt Extremities: Left toe amputations, Right foot amputation of 2 toes  Neuro: Mental Status: Awakens to voice. Decreased level of alertness but appears to increase slightly over the course of exam. Initially patient was not able to give her name but eventually she was able to communicate her name and that it was the third month of the year. Patient could not communicate where she was. Patient appeared to perseverate on some responses to commands and this would prevent her from accurately answering other questions or following other commands.   Cranial Nerves: II: Does not track, has gross saccadic movements. Both pupils reactive with R appearing 4-51m and L appearing 3-4 mm.  III,IV, VI: Eyes are conjugate. Did not track to command.  V,VII: Does not blink to threat. VIII: hearing intact to some questions IX,X: Symmetrical elevation of palate. XI: Moves head from side to side at times. XII: Tongue symmetric with no fasciculations.  Motor: Has to be assissted to hold up BUE but is able to hold them up for a few seconds before sudden loss of tone. Does not squeeze hands on command today. Patient is able to lift L leg against gravity but has loss of tone suddenly, patient  continues to try to lift her leg as she perseverates on the task. Patient was unable to lift the R leg. Sensory: Reacts to tactile stimuli in upper and lower extremities Deep Tendon Reflexes: 1+ bilateral brachioradialis and patellae Cerebellar: Unable to assess; does not follow commands for testing Gait: Unable to assess  Pertinent Labs: CSF, Protein: 58 CSF, Glucose: 123 CSF, Gram stain cx: WBC PRESENT, PREDOMINANTLY MONONUCLEAR  NO ORGANISMS SEEN  CYTOSPIN SMEAR  No growth <24h CSF, cell count w/ diff: colorless, clear, WBC 1, RBC 0. A1c: 14.3 Mg: 1.6 Phos: 3.4 CBC Latest Ref Rng & Units 01/05/2021 01/04/2021 10/07/2020  WBC 4.0 - 10.5 K/uL 6.3 9.7 7.1  Hemoglobin 12.0 - 15.0 g/dL 7.8(L) 9.5(L) 12.9  Hematocrit 36.0 - 46.0 % 25.6(L) 30.9(L) 40.0  Platelets 150 - 400 K/uL 119(L) 143(L) 417(H)   BMP Latest Ref Rng & Units 01/05/2021 01/04/2021 10/07/2020  Glucose 70 - 99 mg/dL 209(H) 209(H) 149(H)  BUN 6 - 20 mg/dL 42(H) 43(H) 21(H)  Creatinine 0.44 - 1.00 mg/dL 2.78(H) 2.88(H) 1.30(H)  BUN/Creat Ratio 6 - 22 (calc) - - -  Sodium 135 - 145 mmol/L 150(H) 150(H) 137  Potassium 3.5 - 5.1 mmol/L 4.0 4.2 4.3  Chloride 98 - 111 mmol/L 125(H) 124(H) 101  CO2 22 - 32 mmol/L 21(L) 20(L) 28  Calcium 8.9 - 10.3 mg/dL 7.8(L) 8.2(L) 9.0   Labs from hospitalization prior to transfer are in paper chart.  Impression: [name] an 45y.o. 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 She had blood sugar > 1200 and was diagnosed with HHS at that time. On exam today patient appears to have some improvements in presentation as patient  appears less irritable and slowly appeared more alert. Patient does continue to struggle to maintain attention and stay alert. Patient exam noted sudden loss of tone on exam. Due to patient's historical medications including depakote will assess ammonia level for concern for hyperammonemia causing encephalopathy. If Ammonia is not elevated could consider restarting patient Depakote. Patient renal function appears to be below baseline despite hx of CKD, suggesting that patient may be slowly clearing her medications and waste and could be the etiology for patient's continued encephalopathy. Recommend continuing patient gabapentin at current dose rather than home dose as this can worsen kidney function. Will continue Acyclovir until patient CSF is confirmed negative for HSV infection.  Agree with primary team that patient should continue treatment for prior dx bacteremia. Patient's continued encephalopathy could also be prolonged delirium from her HHS with bacteremia, patient appears to be improving at this time will continue to monitor.   Recommendations: - Proceed w/ MRI, can give '1mg'$  Ativan for sedation if needed - F/u NH3 - F/u HSV, CSF - F/u VDRL, CSF - F/u anaerobic cx - F/u Cytology - Continue Acyclovir - Continue abx tx - Neurology will continue to follow  Damita Dunnings, MD PGY-1

## 2021-01-06 ENCOUNTER — Inpatient Hospital Stay (HOSPITAL_COMMUNITY): Payer: Medicaid Other

## 2021-01-06 DIAGNOSIS — I342 Nonrheumatic mitral (valve) stenosis: Secondary | ICD-10-CM | POA: Diagnosis not present

## 2021-01-06 DIAGNOSIS — G9341 Metabolic encephalopathy: Secondary | ICD-10-CM | POA: Diagnosis not present

## 2021-01-06 DIAGNOSIS — I361 Nonrheumatic tricuspid (valve) insufficiency: Secondary | ICD-10-CM

## 2021-01-06 DIAGNOSIS — I340 Nonrheumatic mitral (valve) insufficiency: Secondary | ICD-10-CM

## 2021-01-06 DIAGNOSIS — B9620 Unspecified Escherichia coli [E. coli] as the cause of diseases classified elsewhere: Secondary | ICD-10-CM | POA: Diagnosis not present

## 2021-01-06 LAB — GLUCOSE, CAPILLARY
Glucose-Capillary: 165 mg/dL — ABNORMAL HIGH (ref 70–99)
Glucose-Capillary: 103 mg/dL — ABNORMAL HIGH (ref 70–99)
Glucose-Capillary: 86 mg/dL (ref 70–99)
Glucose-Capillary: 202 mg/dL — ABNORMAL HIGH (ref 70–99)
Glucose-Capillary: 165 mg/dL — ABNORMAL HIGH (ref 70–99)
Glucose-Capillary: 126 mg/dL — ABNORMAL HIGH (ref 70–99)

## 2021-01-06 LAB — ECHOCARDIOGRAM COMPLETE
Area-P 1/2: 2.93 cm2
Calc EF: 46.8 %
Height: 69 in
MV M vel: 4.78 m/s
MV Peak grad: 91.4 mmHg
MV VTI: 0.61 cm2
Radius: 0.75 cm
S' Lateral: 4.2 cm
Single Plane A2C EF: 42.4 %
Single Plane A4C EF: 50.9 %
Weight: 3418.01 oz

## 2021-01-06 LAB — COMPREHENSIVE METABOLIC PANEL
ALT: 10 U/L (ref 0–44)
AST: 11 U/L — ABNORMAL LOW (ref 15–41)
Albumin: 1.6 g/dL — ABNORMAL LOW (ref 3.5–5.0)
Alkaline Phosphatase: 71 U/L (ref 38–126)
Anion gap: 7 (ref 5–15)
BUN: 37 mg/dL — ABNORMAL HIGH (ref 6–20)
CO2: 19 mmol/L — ABNORMAL LOW (ref 22–32)
Calcium: 7.8 mg/dL — ABNORMAL LOW (ref 8.9–10.3)
Chloride: 116 mmol/L — ABNORMAL HIGH (ref 98–111)
Creatinine, Ser: 2.54 mg/dL — ABNORMAL HIGH (ref 0.44–1.00)
GFR, Estimated: 23 mL/min — ABNORMAL LOW (ref >60)
Glucose, Bld: 141 mg/dL — ABNORMAL HIGH (ref 70–99)
Potassium: 3.6 mmol/L (ref 3.5–5.1)
Sodium: 142 mmol/L (ref 135–145)
Total Bilirubin: 0.7 mg/dL (ref 0.3–1.2)
Total Protein: 5.5 g/dL — ABNORMAL LOW (ref 6.5–8.1)

## 2021-01-06 LAB — CBC WITH DIFFERENTIAL/PLATELET
Abs Immature Granulocytes: 0.07 10*3/uL (ref 0.00–0.07)
Basophils Absolute: 0 10*3/uL (ref 0.0–0.1)
Basophils Relative: 0 %
Eosinophils Absolute: 0.3 10*3/uL (ref 0.0–0.5)
Eosinophils Relative: 3 %
HCT: 26.7 % — ABNORMAL LOW (ref 36.0–46.0)
Hemoglobin: 8.5 g/dL — ABNORMAL LOW (ref 12.0–15.0)
Immature Granulocytes: 1 %
Lymphocytes Relative: 12 %
Lymphs Abs: 1.2 10*3/uL (ref 0.7–4.0)
MCH: 27.7 pg (ref 26.0–34.0)
MCHC: 31.8 g/dL (ref 30.0–36.0)
MCV: 87 fL (ref 80.0–100.0)
Monocytes Absolute: 0.6 10*3/uL (ref 0.1–1.0)
Monocytes Relative: 6 %
Neutro Abs: 8.4 10*3/uL — ABNORMAL HIGH (ref 1.7–7.7)
Neutrophils Relative %: 78 %
Platelets: 180 10*3/uL (ref 150–400)
RBC: 3.07 MIL/uL — ABNORMAL LOW (ref 3.87–5.11)
RDW: 17.4 % — ABNORMAL HIGH (ref 11.5–15.5)
WBC: 10.6 10*3/uL — ABNORMAL HIGH (ref 4.0–10.5)
nRBC: 0 % (ref 0.0–0.2)

## 2021-01-06 MED ORDER — MAGNESIUM SULFATE 2 GM/50ML IV SOLN
2.0000 g | Freq: Once | INTRAVENOUS | Status: AC
  Administered 2021-01-06: 2 g via INTRAVENOUS
  Filled 2021-01-06: qty 50

## 2021-01-06 MED ORDER — DIVALPROEX SODIUM 250 MG PO DR TAB
500.0000 mg | DELAYED_RELEASE_TABLET | Freq: Three times a day (TID) | ORAL | Status: DC
  Administered 2021-01-06 – 2021-01-07 (×5): 500 mg via ORAL
  Filled 2021-01-06 (×6): qty 2

## 2021-01-06 MED ORDER — INSULIN GLARGINE 100 UNIT/ML ~~LOC~~ SOLN
10.0000 [IU] | Freq: Two times a day (BID) | SUBCUTANEOUS | Status: DC
  Administered 2021-01-06 – 2021-01-12 (×13): 10 [IU] via SUBCUTANEOUS
  Filled 2021-01-06 (×14): qty 0.1

## 2021-01-06 MED ORDER — ADULT MULTIVITAMIN W/MINERALS CH
1.0000 | ORAL_TABLET | Freq: Every day | ORAL | Status: DC
  Administered 2021-01-06 – 2021-01-12 (×7): 1 via ORAL
  Filled 2021-01-06 (×7): qty 1

## 2021-01-06 MED ORDER — ENSURE ENLIVE PO LIQD: 237.0000 mL | Freq: Three times a day (TID) | ORAL | Status: DC

## 2021-01-06 MED ORDER — STROKE: EARLY STAGES OF RECOVERY BOOK
Freq: Once | Status: AC
  Filled 2021-01-06: qty 1

## 2021-01-06 MED ORDER — ASPIRIN 300 MG RE SUPP: 300.0000 mg | Freq: Every day | RECTAL | Status: DC

## 2021-01-06 MED ORDER — ASPIRIN 325 MG PO TABS
325.0000 mg | ORAL_TABLET | Freq: Every day | ORAL | Status: DC
  Administered 2021-01-06 – 2021-01-12 (×7): 325 mg via ORAL
  Filled 2021-01-06 (×7): qty 1

## 2021-01-06 MED ORDER — PERFLUTREN LIPID MICROSPHERE
1.0000 mL | INTRAVENOUS | Status: AC | PRN
  Administered 2021-01-06: 2 mL via INTRAVENOUS
  Filled 2021-01-06: qty 10

## 2021-01-06 NOTE — Progress Notes (Signed)
PROGRESS NOTE    [name]  [id number] DOB: 1976-02-20 DOA: 01/04/2021 PCP: [facility]    Brief Narrative:  45 year old female with extensive medical problems including chronic combined heart failure, type 2 diabetes on insulin, hypertension, COPD, GERD, seizure disorder, bipolar disorder, history of substance abuse, sensory neuropathy who was admitted to [facility] since 3/8 where she presented with confusion of about 1 day.  Patient was unable to provide any history.  Her mother who lives with her able to give history that she was at her usual self 2 days prior to admission.  At the outside hospital, she was found to be in HHS with blood sugars more than 1200.  Treated with insulin drip then subsequently changed to subcu insulin with no improvement of mentation.  She started spiking fever, blood culture reportedly with E. coli, urine culture not reported.  Treated with ceftriaxone.  With persistent encephalopathy despite treatment she was transferred to [facility] with neurology consultation. Mental status improving.  MRI did show multiple embolic stroke.   Assessment & Plan:   Principal Problem:   Metabolic encephalopathy Active Problems:   Bipolar disorder, unspecified ([place])   DM type 2 (diabetes mellitus, type 2)- poorly controlled   Ulcer of great toe, left, with fat layer exposed ([place])   Essential hypertension   Chronic systolic CHF (congestive heart failure) ([facility])   PVD (peripheral vascular disease) ([place])   Acute kidney injury superimposed on CKD ([place])   Bacteremia due to Escherichia coli   Acute metabolic encephalopathy  Acute metabolic encephalopathy: Multifactorial encephalopathy.   HSS is corrected but mental status did not correct hence transferred to [facility]. Her mental status is gradually improving and returning to normal.  Prolonged delirium. Acute bacterial meningitis ruled out.  HSV negative.  Discontinue all antibiotics and  antiviral except treatment for E. coli bacteremia. No focal deficits. All-time fall precautions.  Delirium precautions.  Patient on multiple mental health medications, mood stabilizers.  Gradually resuming her Seroquel and SSRIs.  Will discontinue TCA.  Acute multifocal cardioembolic stroke: MRI 0000000 shows acute multifocal cardioembolic stroke.  Patient with history of peripheral vascular disease, previously used to be on Eliquis, was taken off.  May have underlying cardioembolic source. Currently neurologically improving.  No focal deficits except generalized weakness. 2D echocardiogram to look for any source of embolism. We will discuss with neurology about carotid and intracranial imaging, cannot have CTA because of kidney functions.  Continue to work with therapies.  Suspected meningo-encephalitis: Ruled out.  E. coli bacteremia: Blood cultures 3/8 , urine cultures 3/8 outside hospital with E. coli pansensitive.  Repeat cultures negative.  CT scan of the abdomen pelvis without any evidence of hydronephrosis or obstruction. Continue Rocephin, will treat total 10 days of therapy.  Repeat cultures done 3/13 on admission. Patient has Foley catheter in place, will remove once she is more mobile.  Acute kidney injury with history of chronic kidney disease stage IIIa: Baseline creatinine about 1.4.  Gradually improving.  Urine output is adequate.  Hyperchloremic hypernatremia: Due to total free water deficit.  On half-normal saline now.  We will continue until she can maintain by mouth.  Type 2 diabetes with HHS: Blood sugars fairly controlled.  Hemoglobin A1c is 14.   Now with improving oral intake, increased dose of long-acting insulin.  Gradually uptitrate.  Chronic systolic heart failure: Compensated.  Holding diuretics.  Peripheral vascular disease/peripheral arterial stents: Mother reported that patient is off anticoagulation now.  No evidence  of active ischemia.  She may need to go  back on Eliquis with cardioembolic stroke.  Will discuss with neurology.  History of bipolar disorder/peripheral neuropathy: Patient on  amitriptyline 150 mg daily Gabapentin 600 mg 3 times a day Seroquel 800 mg at night Venlafaxine 150 mg daily. With altered mental status, all her medications were held.  Patient may go into withdrawals from SSRIs.  TCAs less likely to develop withdrawals.  She does not have any evidence of neuroleptic malignant syndrome. We will resume gabapentin one third of the dose, Seroquel half dose, resume venlafaxine.   DVT prophylaxis: heparin injection 5,000 Units Start: 01/04/21 0600   Code Status: Full code Family Communication: Mother at bedside  Disposition Plan: Status is: Inpatient  Remains inpatient appropriate because:IV treatments appropriate due to intensity of illness or inability to take PO and Inpatient level of care appropriate due to severity of illness   Dispo: The patient is from: Home              Anticipated d/c is to: SNF              Patient currently is not medically stable to d/c.   Difficult to place patient No         Consultants:   Neurology  Procedures:   Lumbar puncture  EEG  Antimicrobials:  Antibiotics Given (last 72 hours)    Date/Time Action Medication Dose Rate   01/04/21 0653 New Bag/Given   ampicillin (OMNIPEN) 2 g in sodium chloride 0.9 % 100 mL IVPB 2 g 300 mL/hr   01/04/21 0657 New Bag/Given   acyclovir (ZOVIRAX) 750 mg in dextrose 5 % 150 mL IVPB 750 mg 165 mL/hr   01/04/21 0945 New Bag/Given   cefTRIAXone (ROCEPHIN) 2 g in sodium chloride 0.9 % 100 mL IVPB 2 g 200 mL/hr   01/04/21 1301 New Bag/Given   ampicillin (OMNIPEN) 2 g in sodium chloride 0.9 % 100 mL IVPB 2 g 300 mL/hr   01/04/21 2151 New Bag/Given   cefTRIAXone (ROCEPHIN) 2 g in sodium chloride 0.9 % 100 mL IVPB 2 g 200 mL/hr   01/04/21 2152 New Bag/Given   acyclovir (ZOVIRAX) 750 mg in dextrose 5 % 150 mL IVPB 750 mg 165 mL/hr    01/05/21 1349 New Bag/Given   acyclovir (ZOVIRAX) 750 mg in dextrose 5 % 150 mL IVPB 750 mg 165 mL/hr   01/05/21 1559 New Bag/Given   cefTRIAXone (ROCEPHIN) 2 g in sodium chloride 0.9 % 100 mL IVPB 2 g 200 mL/hr   01/05/21 2246 New Bag/Given   acyclovir (ZOVIRAX) 750 mg in dextrose 5 % 150 mL IVPB 750 mg 165 mL/hr   01/06/21 1132 New Bag/Given   cefTRIAXone (ROCEPHIN) 2 g in sodium chloride 0.9 % 100 mL IVPB 2 g 200 mL/hr         Subjective: Patient seen and examined.  Mother at the bedside.  More awake and talking today.  Does not have good appetite, however she eats when her mom helps her.  Mother wants her to go to some subacute rehab at [place] area.  Patient remains afebrile.  Blood sugars picking up.  Objective: Vitals:   01/05/21 1014 01/05/21 2046 01/05/21 2349 01/06/21 0417  BP: 109/69 94/60 106/68 126/84  Pulse: 92 80 89 (!) 103  Resp: '14 13 16 18  '$ Temp:  98.6 F (37 C) 97.6 F (36.4 C) 98 F (36.7 C)  TempSrc:  Oral Oral Oral  SpO2: 100% 98% 96%  100%  Weight:      Height:        Intake/Output Summary (Last 24 hours) at 01/06/2021 1144 Last data filed at 01/05/2021 1200 Gross per 24 hour  Intake 240 ml  Output -  Net 240 ml   Filed Weights   01/04/21 0808 01/05/21 0413  Weight: 95.7 kg 96.9 kg    Examination:  General: Frail and debilitated.  Chronically sick looking lady currently on room air. She is alert and awake.  Oriented x3 today.  Her speech is clear and communication is clear today. Cardiovascular: S1-S2 normal.  No added sound. Respiratory: Bilateral clear.  No added sounds. Gastrointestinal: Soft and nontender.  Bowel sounds present.  Pendulous. Ext: Anasarca both upper arm.  PICC line left arm.  Both legs with no edema or cyanosis.  She does have chronic noninfected wounds on the left foot.  She has amputation of the right fourth and fifth toe.  No evidence of active infection. Neuro: Generalized weakness.  No focal deficits.  She has  edentulous mouth with absence of dentures, but no facial droop.    Data Reviewed: I have personally reviewed following labs and imaging studies  CBC: Recent Labs  Lab 01/04/21 0313 01/05/21 0149 01/06/21 0525  WBC 9.7 6.3 10.6*  NEUTROABS 7.9* 4.8 8.4*  HGB 9.5* 7.8* 8.5*  HCT 30.9* 25.6* 26.7*  MCV 86.6 89.2 87.0  PLT 143* 119* [id number]   Basic Metabolic Panel: Recent Labs  Lab 01/04/21 0313 01/05/21 0149 01/06/21 0525  NA 150* 150* 142  K 4.2 4.0 3.6  CL 124* 125* 116*  CO2 20* 21* 19*  GLUCOSE 209* 209* 141*  BUN 43* 42* 37*  CREATININE 2.88* 2.78* 2.54*  CALCIUM 8.2* 7.8* 7.8*  MG  --  1.6*  --   PHOS  --  3.4  --    GFR: Estimated Creatinine Clearance: 34.7 mL/min (A) (by C-G formula based on SCr of 2.54 mg/dL (H)). Liver Function Tests: Recent Labs  Lab 01/04/21 0313 01/05/21 0149 01/06/21 0525  AST 15 10* 11*  ALT '11 10 10  '$ ALKPHOS 88 62 71  BILITOT 0.3 0.6 0.7  PROT 5.9* 4.9* 5.5*  ALBUMIN 1.6* 1.3* 1.6*   No results for input(s): LIPASE, AMYLASE in the last 168 hours. Recent Labs  Lab 01/05/21 1300  AMMONIA 20   Coagulation Profile: No results for input(s): INR, PROTIME in the last 168 hours. Cardiac Enzymes: No results for input(s): CKTOTAL, CKMB, CKMBINDEX, TROPONINI in the last 168 hours. BNP (last 3 results) No results for input(s): PROBNP in the last 8760 hours. HbA1C: Recent Labs    01/04/21 0320  HGBA1C 14.3*   CBG: Recent Labs  Lab 01/05/21 2016 01/05/21 2350 01/06/21 0416 01/06/21 0747 01/06/21 1133  GLUCAP 270* 235* 165* 103* 86   Lipid Profile: No results for input(s): CHOL, HDL, LDLCALC, TRIG, CHOLHDL, LDLDIRECT in the last 72 hours. Thyroid Function Tests: No results for input(s): TSH, T4TOTAL, FREET4, T3FREE, THYROIDAB in the last 72 hours. Anemia Panel: No results for input(s): VITAMINB12, FOLATE, FERRITIN, TIBC, IRON, RETICCTPCT in the last 72 hours. Sepsis Labs: No results for input(s): PROCALCITON,  LATICACIDVEN in the last 168 hours.  Recent Results (from the past 240 hour(s))  CSF culture w Gram Stain     Status: None (Preliminary result)   Collection Time: 01/04/21 11:07 AM   Specimen: PATH Cytology CSF; Cerebrospinal Fluid  Result Value Ref Range Status   Specimen Description CSF  Final  Special Requests NONE  Final   Gram Stain   Final    WBC PRESENT, PREDOMINANTLY MONONUCLEAR NO ORGANISMS SEEN CYTOSPIN SMEAR    Culture   Final    NO GROWTH 2 DAYS Performed at [facility], [address], [place], [place] [address]    Report Status PENDING  Incomplete         Radiology Studies: MR BRAIN WO CONTRAST  Result Date: 01/06/2021 CLINICAL DATA:  Mental status change. Unknown cause of encephalopathy. EXAM: MRI HEAD WITHOUT CONTRAST TECHNIQUE: Multiplanar, multiecho pulse sequences of the brain and surrounding structures were obtained without intravenous contrast. COMPARISON:  Head CT January 02, 2021 FINDINGS: Brain: Foci of restricted diffusion are seen in the bilateral frontal lobes, right parietal and temporal lobe, left occipital lobe and on the left side of the pons, suggestive of embolic infarcts. Remote infarct in the left frontal lobe. No hemorrhage, hydrocephalus, extra-axial collection or mass lesion. Vascular: Normal flow voids. Skull and upper cervical spine: Normal marrow signal. Sinuses/Orbits: Negative. Other: Minimal bilateral mastoid effusion. Remote infarct in the left frontal lobe IMPRESSION: 1. Foci of restricted diffusion in the bilateral frontal lobes, right parietal and temporal lobe, left occipital lobe and left side of the pons, suggestive of embolic acute infarcts. 2. Remote infarct in the left frontal lobe. These results will be called to the ordering clinician or representative by the Radiologist Assistant, and communication documented in the PACS or [employer]. Electronically Signed   By: Pedro Earls M.D.   On: 01/06/2021 10:26    DG FL GUIDED LUMBAR PUNCTURE  Result Date: 01/04/2021 CLINICAL DATA:  Encephalopathy. EXAM: DIAGNOSTIC LUMBAR PUNCTURE UNDER FLUOROSCOPIC GUIDANCE COMPARISON:  None FLUOROSCOPY TIME:  Fluoroscopy Time:  0 minutes and 18 seconds. Radiation Exposure Index (if provided by the fluoroscopic device): 4.50 mGy Number of Acquired Spot Images: 0 PROCEDURE: Informed consent was obtained from the patient's mother prior to the procedure, including potential complications of headache, allergy, and pain. With the patient prone, an appropriate site for lumbar puncture was marked on the patient's skin. The lower back was then prepped with Betadine. 1% Lidocaine was used for local anesthesia. Lumbar puncture was performed at the L3-4 level using a 20 gauge needle with return of clear CSF with an opening pressure of 20 cm water. 10 ml of CSF were obtained for laboratory studies. The patient tolerated the procedure well and there were no apparent complications. IMPRESSION: Fluoroscopic guided lumbar puncture with 10 cc of clear CSF obtained for appropriate laboratory evaluation. Electronically Signed   By: Marijo Sanes M.D.   On: 01/04/2021 12:18        Scheduled Meds: . Chlorhexidine Gluconate Cloth  6 each Topical Daily  . gabapentin  200 mg Oral Q8H  . heparin  5,000 Units Subcutaneous Q8H  . insulin aspart  4 Units Subcutaneous TID WC  . insulin glargine  10 Units Subcutaneous BID  . metoprolol tartrate  12.5 mg Oral BID  . QUEtiapine  400 mg Oral QHS  . venlafaxine XR  75 mg Oral Daily   Continuous Infusions: . sodium chloride 100 mL/hr at 01/06/21 [id number]  . cefTRIAXone (ROCEPHIN)  IV 2 g (01/06/21 1132)  . magnesium sulfate bolus IVPB       LOS: 2 days    Time spent: 32 minutes    Barb Merino, MD [facility] Pager [phone]

## 2021-01-06 NOTE — Progress Notes (Signed)
Subjective: Continues to improve. MRI with multiple small strokes.   Exam: Vitals:   01/05/21 2349 01/06/21 0417  BP: 106/68 126/84  Pulse: 89 (!) 103  Resp: 16 18  Temp: 97.6 F (36.4 C) 98 F (36.7 C)  SpO2: 96% 100%   Gen: In bed, NAD Resp: non-labored breathing, no acute distress Abd: soft, nt  Neuro: MS: awake, alert, gives month as "3 or 4",  [id number], EOMI with saccadic smooth pursuit(imprved from yesterday, VFF, maybe slightly flattened NL fold Motor: she gives poor effort throughout but I suspect she does have a mild left hemiparesis.  Sensory: reduced in left leg to LT, otherwise intact.    Pertinent Labs: Ammonia normal  Impression: 45 yo F with a multifactorial toxic/metabolic encephalopathy including renal failure, gram-negative bacteremia, hyperosmolar state initially that is improving. In addition, multifocal small punctate embolic infarcts were found on MRI. She has a history of endocarditis, and this will need to be investigated as well. OTher causes of cardioembolic phenomena are also possible. Will get MRA head and neck also.   Recommendations: 1) MRA head and neck 2) Echo, if negative likley will need TEE to look for vegetations.  3) Cardiac monitoring 4) ASA for now, though if endocarditis is found, can d/c this.  5) Will follow.   Roland Rack, MD [facility] [phone]  If 7pm- 7am, please page neurology on call as listed in [place].

## 2021-01-06 NOTE — Progress Notes (Signed)
Inpatient Rehabilitation Admissions Coordinator  Inpatient rehab consult received. I met with patient and her Mom at bedside. We discussed goals and expectations of a possible CIR admit. Patient and Mom express preference for rehab closer to home at a SNF. I have updated RN CM and SW. We will sign off at this time due to patient/family preference.  Danne Baxter, RN, MSN Rehab Admissions Coordinator [phone] 01/06/2021 11:15 AM

## 2021-01-06 NOTE — Progress Notes (Signed)
  Echocardiogram 2D Echocardiogram with defintiy has been performed.  [name] 01/06/2021, 2:11 PM

## 2021-01-06 NOTE — TOC Initial Note (Signed)
Transition of Care [facility]) - Initial/Assessment Note    Patient Details  Name: [name] MRN: [id number] Date of Birth: August 22, 1976  Transition of Care [facility]) CM/SW Contact:    [name], [place] Work Phone Number: 01/06/2021, 1:39 PM  Clinical Narrative:    MSW Student spoke with patients mother who desires patient to be sent to a skilled nursing facility. Patient has Medicaid and was informed that she would need to stay in facility for 30 days in order for Medicaid to cover her stay. Patient does not receive medicaid check which will limit patients facility options. Patient's mother wants to find a facility in [place] area. FL2 faxed out, waiting for PASAR number.               Expected Discharge Plan: Skilled Nursing Facility Barriers to Discharge: Continued Medical Work up,Inadequate or no insurance   Patient Goals and CMS Choice Patient states their goals for this hospitalization and ongoing recovery are:: Unable to assess CMS Medicare.gov Compare Post Acute Care list provided to:: Patient Represenative (must comment) Choice offered to / list presented to : Parent  Expected Discharge Plan and Services Expected Discharge Plan: [place] Acute Care Choice: [place] Living arrangements for the past 2 months: Single Family Home                                      Prior Living Arrangements/Services Living arrangements for the past 2 months: Single Family Home Lives with:: Parents Patient language and need for interpreter reviewed:: Yes Do you feel safe going back to the place where you live?: Yes      Need for Family Participation in Patient Care: Yes (Comment) Care giver support system in place?: No (comment)   Criminal Activity/Legal Involvement Pertinent to Current Situation/Hospitalization: No - Comment as needed  Activities of Daily Living Home Assistive Devices/Equipment: Eyeglasses ADL Screening  (condition at time of admission) Patient's cognitive ability adequate to safely complete daily activities?: No Is the patient deaf or have difficulty hearing?: No Does the patient have difficulty seeing, even when wearing glasses/contacts?: No Does the patient have difficulty concentrating, remembering, or making decisions?: Yes Patient able to express need for assistance with ADLs?: Yes Does the patient have difficulty dressing or bathing?: Yes Independently performs ADLs?: No Communication: Needs assistance Is this a change from baseline?: Pre-admission baseline Dressing (OT): Needs assistance Grooming: Needs assistance Feeding: Needs assistance Bathing: Needs assistance Toileting: Needs assistance In/Out Bed: Needs assistance Walks in Home: Needs assistance Does the patient have difficulty walking or climbing stairs?: Yes Weakness of Legs: Right Weakness of Arms/Hands: Left  Permission Sought/Granted Permission sought to share information with : Facility Retail banker granted to share information with : Yes, Verbal Permission Granted  Share Information with NAME: [name]  Permission granted to share info w AGENCY: SNF  Permission granted to share info w Relationship: Mother     Emotional Assessment Appearance:: Appears older than stated age Attitude/Demeanor/Rapport: Unable to Assess Affect (typically observed): Unable to Assess Orientation: : Oriented to Self Alcohol / Substance Use: Illicit Drugs Psych Involvement: No (comment)  Admission diagnosis:  Acute metabolic encephalopathy 99991111 Patient Active Problem List   Diagnosis Date Noted  . Metabolic encephalopathy 123456  . Bacteremia due to Escherichia coli 01/04/2021  . Acute metabolic encephalopathy 123456  . PVD (peripheral vascular disease) ([place])   .  Acute kidney injury superimposed on CKD ([place])   . Pressure ulcer of left foot   . Diabetic polyneuropathy associated  with type 2 diabetes mellitus ([place])   . Hyperlipidemia   . Osteomyelitis of metatarsal ([place]) 09/30/2020  . Hyperlipidemia associated with type 2 diabetes mellitus ([place]) 09/30/2020  . Substance use disorder 09/30/2020  . CKD stage 3 due to type 2 diabetes mellitus ([place]) 09/30/2020  . Cellulitis of left foot   . Pressure injury of skin 04/01/2020  . Osteomyelitis of left foot ([place]) 03/30/2020  . Osteomyelitis of fifth toe of left foot ([place]) 08/15/2019  . Bacteremia   . Essential hypertension 03/21/2019  . Chronic systolic CHF (congestive heart failure) ([place]) 03/21/2019  . Toe osteomyelitis, left ([place]) 03/21/2019  . Sepsis ([place]) 03/21/2019  . Atherosclerosis of native arteries of the extremities with ulceration ([place]) 03/04/2019  . Ulcer of great toe, left, with fat layer exposed ([place]) 09/28/2018  . Menometrorrhagia 06/29/2018  . 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 Secondary localized osteoarthrosis of ankle and foot 01/10/2018  . Chronic foot pain, left 02/20/2017  . Leg edema, left 02/20/2017  . 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 Urinary retention with incomplete bladder emptying 05/02/2014  . 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 Cocaine abuse ([place]) 04/23/2014  . Bipolar disorder, unspecified ([place]) 04/23/2014  . Morbid obesity ([place]) 04/23/2014  . DM type 2 (diabetes mellitus, type 2)- poorly controlled 04/23/2014  . Toxic encephalopathy 04/20/2014  . 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 Lactic acid acidosis 04/20/2014  . Paraparesis of both lower limbs ([place]) 04/20/2014  . HCAP (healthcare-associated pneumonia) 04/20/2014  . Post-traumatic stress disorder, unspecified 03/20/2014  . Severe cannabis use disorder ([place]) 03/20/2014   PCP:  Care, [facility]:   [facility] [address] [place] [address] Phone: [phone] Fax: [place], [place] [address] Phone: [phone] Fax: [place], [place] - [address] [place] [address] [place] [address] Phone: [phone] Fax: [phone]  [place], [place] [address] Phone: [phone] Fax: [phone]     Social Determinants of Health (SDOH) Interventions    Readmission Risk Interventions No flowsheet data found.

## 2021-01-06 NOTE — NC FL2 (Addendum)
[place] MEDICAID FL2 LEVEL OF CARE SCREENING TOOL     IDENTIFICATION  Patient Name: [name] Birthdate: [birth year] Sex: female Admission Date (Current Location): 01/04/2021  [facility] and [place] Number:  Herbalist and Address:  [place] [address] [place], [place] [address]      Provider Number: [id number]  Attending Physician Name and Address:  Barb Merino, MD  Relative Name and Phone Number:       Current Level of Care: Hospital Recommended Level of Care: [place] Prior Approval Number:    Date Approved/Denied:   PASRR Number: Pending  Discharge Plan: SNF    Current Diagnoses: Patient Active Problem List   Diagnosis Date Noted  . Metabolic encephalopathy 123456  . Bacteremia due to Escherichia coli 01/04/2021  . Acute metabolic encephalopathy 123456  . PVD (peripheral vascular disease) ([place])   . Acute kidney injury superimposed on CKD ([place])   . Pressure ulcer of left foot   . Diabetic polyneuropathy associated with type 2 diabetes mellitus ([place])   . Hyperlipidemia   . Osteomyelitis of metatarsal ([place]) 09/30/2020  . Hyperlipidemia associated with type 2 diabetes mellitus ([place]) 09/30/2020  . Substance use disorder 09/30/2020  . CKD stage 3 due to type 2 diabetes mellitus ([name]) 09/30/2020  . Cellulitis of left foot   . Pressure injury of skin 04/01/2020  . Osteomyelitis of left foot ([place]) 03/30/2020  . Osteomyelitis of fifth toe of left foot ([place]) 08/15/2019  . Bacteremia   . Essential hypertension 03/21/2019  . Chronic systolic CHF (congestive heart failure) ([place]) 03/21/2019  . Toe osteomyelitis, left ([place]) 03/21/2019  . Sepsis ([place]) 03/21/2019  . Atherosclerosis of native arteries of the extremities with ulceration ([place]) 03/04/2019  . Ulcer of great toe, left, with fat layer exposed ([place]) 09/28/2018  . Menometrorrhagia 06/29/2018  . 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  Secondary localized osteoarthrosis of ankle and foot 01/10/2018  . Chronic foot pain, left 02/20/2017  . Leg edema, left 02/20/2017  . 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 Urinary retention with incomplete bladder emptying 05/02/2014  . 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 Cocaine abuse ([place]) 04/23/2014  . Bipolar disorder, unspecified ([place]) 04/23/2014  . Morbid obesity ([place]) 04/23/2014  . DM type 2 (diabetes mellitus, type 2)- poorly controlled 04/23/2014  . Toxic encephalopathy 04/20/2014  . 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 Lactic acid acidosis 04/20/2014  . Paraparesis of both lower limbs ([place]) 04/20/2014  . HCAP (healthcare-associated pneumonia) 04/20/2014  . Post-traumatic stress disorder, unspecified 03/20/2014  . Severe cannabis use disorder ([place]) 03/20/2014    Orientation RESPIRATION BLADDER Height & Weight     Self  Normal Indwelling catheter Weight: 213 lb 10 oz (96.9 kg) Height:  '5\' 9"'$  (175.3 cm)  BEHAVIORAL SYMPTOMS/MOOD NEUROLOGICAL BOWEL NUTRITION STATUS      Continent Diet (See DC Summary)  AMBULATORY STATUS COMMUNICATION OF NEEDS Skin   Extensive Assist Verbally PU Stage and Appropriate Care,Other (Comment) (Incision (open or dehisced) moisture associated skin damage perineum. No dressing)   PU Stage 2 Dressing: Daily (Heel Right; bilateral; left. Partial thickness loss of dermis. Foam - lift to assess every shift)                   Personal Care Assistance Level of Assistance  Bathing,Feeding,Dressing Bathing Assistance: Maximum assistance Feeding assistance: Maximum assistance Dressing Assistance: Maximum assistance     Functional Limitations Info  Sight,Hearing,Speech Sight Info: Adequate Hearing Info: Adequate Speech Info: Adequate    SPECIAL CARE FACTORS FREQUENCY  PT (By licensed PT),OT (By licensed OT)     PT Frequency: 5x/week OT Frequency: 5x/week            Contractures Contractures Info: Not  present    Additional Factors Info  Code Status,Allergies Code Status Info: Full Allergies Info: Albumin (Human), Erythromycin, Midecamycin           Current Medications (01/06/2021):  This is the current hospital active medication list Current Facility-Administered Medications  Medication Dose Route Frequency Provider Last Rate Last Admin  . 0.45 % sodium chloride infusion   Intravenous Continuous Barb Merino, MD 100 mL/hr at 01/06/21 0638 New Bag at 01/06/21 [id number]  . acetaminophen (TYLENOL) tablet 650 mg  650 mg Oral Q6H PRN Chotiner, Yevonne Aline, MD       Or  . acetaminophen (TYLENOL) suppository 650 mg  650 mg Rectal Q6H PRN Chotiner, Yevonne Aline, MD      . albuterol (PROVENTIL) (2.5 MG/3ML) 0.083% nebulizer solution 2.5 mg  2.5 mg Nebulization Q4H PRN Chotiner, Yevonne Aline, MD      . cefTRIAXone (ROCEPHIN) 2 g in sodium chloride 0.9 % 100 mL IVPB  2 g Intravenous Q24H Barb Merino, MD 200 mL/hr at 01/05/21 1559 2 g at 01/05/21 1559  . Chlorhexidine Gluconate Cloth 2 % PADS 6 each  6 each Topical Daily Barb Merino, MD   6 each at 01/04/21 0939  . feeding supplement (OSMOLITE 1.2 CAL) liquid 1,000 mL  1,000 mL Per Tube Continuous Barb Merino, MD      . gabapentin (NEURONTIN) 250 MG/5ML solution 200 mg  200 mg Oral Q8H Ghimire, Dante Gang, MD   200 mg at 01/05/21 1600  . Gerhardt's butt cream   Topical PRN Barb Merino, MD   1 application at [id number]  . heparin injection 5,000 Units  5,000 Units Subcutaneous Q8H Chotiner, Yevonne Aline, MD   5,000 Units at 01/06/21 [phone]  . insulin aspart (novoLOG) injection 0-9 Units  0-9 Units Subcutaneous Q4H PRN Barb Merino, MD   2 Units at 01/06/21 0431  . insulin aspart (novoLOG) injection 4 Units  4 Units Subcutaneous TID WC Barb Merino, MD   4 Units at 01/06/21 0900  . insulin glargine (LANTUS) injection 10 Units  10 Units Subcutaneous BID Barb Merino, MD      . LORazepam (ATIVAN) injection 1 mg  1 mg Intravenous Once PRN Damita Dunnings  B, MD      . metoprolol tartrate (LOPRESSOR) tablet 12.5 mg  12.5 mg Oral BID Chotiner, Yevonne Aline, MD   12.5 mg at 01/04/21 2159  . QUEtiapine (SEROQUEL) tablet 400 mg  400 mg Oral QHS Barb Merino, MD   400 mg at 01/04/21 2159  . venlafaxine XR (EFFEXOR-XR) 24 hr capsule 75 mg  75 mg Oral Daily Barb Merino, MD   75 mg at 01/05/21 1112     Discharge Medications: Please see discharge summary for a list of discharge medications.  Relevant Imaging Results:  Relevant Lab Results:   Additional Information SSN: [id number]  [name], Student-Social Work

## 2021-01-06 NOTE — Progress Notes (Signed)
Name: [name] DOB: [birth year]  Please be advised that the above-named patient will require a short-term nursing home stay -- anticipated 30 days or less for rehabilitation and strengthening. The plan is for return home.

## 2021-01-07 ENCOUNTER — Inpatient Hospital Stay (HOSPITAL_COMMUNITY): Payer: Medicaid Other

## 2021-01-07 DIAGNOSIS — G9341 Metabolic encephalopathy: Secondary | ICD-10-CM | POA: Diagnosis not present

## 2021-01-07 DIAGNOSIS — B9620 Unspecified Escherichia coli [E. coli] as the cause of diseases classified elsewhere: Secondary | ICD-10-CM | POA: Diagnosis not present

## 2021-01-07 LAB — COMPREHENSIVE METABOLIC PANEL
ALT: 8 U/L (ref 0–44)
AST: 9 U/L — ABNORMAL LOW (ref 15–41)
Albumin: 1.4 g/dL — ABNORMAL LOW (ref 3.5–5.0)
Alkaline Phosphatase: 62 U/L (ref 38–126)
Anion gap: 5 (ref 5–15)
BUN: 31 mg/dL — ABNORMAL HIGH (ref 6–20)
CO2: 19 mmol/L — ABNORMAL LOW (ref 22–32)
Calcium: 7.8 mg/dL — ABNORMAL LOW (ref 8.9–10.3)
Chloride: 116 mmol/L — ABNORMAL HIGH (ref 98–111)
Creatinine, Ser: 2.36 mg/dL — ABNORMAL HIGH (ref 0.44–1.00)
GFR, Estimated: 25 mL/min — ABNORMAL LOW (ref >60)
Glucose, Bld: 79 mg/dL (ref 70–99)
Potassium: 4 mmol/L (ref 3.5–5.1)
Sodium: 140 mmol/L (ref 135–145)
Total Bilirubin: 0.7 mg/dL (ref 0.3–1.2)
Total Protein: 5.1 g/dL — ABNORMAL LOW (ref 6.5–8.1)

## 2021-01-07 LAB — CBC WITH DIFFERENTIAL/PLATELET
Abs Immature Granulocytes: 0.08 10*3/uL — ABNORMAL HIGH (ref 0.00–0.07)
Basophils Absolute: 0 10*3/uL (ref 0.0–0.1)
Basophils Relative: 0 %
Eosinophils Absolute: 0.2 10*3/uL (ref 0.0–0.5)
Eosinophils Relative: 3 %
HCT: 25.8 % — ABNORMAL LOW (ref 36.0–46.0)
Hemoglobin: 8 g/dL — ABNORMAL LOW (ref 12.0–15.0)
Immature Granulocytes: 1 %
Lymphocytes Relative: 16 %
Lymphs Abs: 1.5 10*3/uL (ref 0.7–4.0)
MCH: 27.2 pg (ref 26.0–34.0)
MCHC: 31 g/dL (ref 30.0–36.0)
MCV: 87.8 fL (ref 80.0–100.0)
Monocytes Absolute: 0.5 10*3/uL (ref 0.1–1.0)
Monocytes Relative: 6 %
Neutro Abs: 6.7 10*3/uL (ref 1.7–7.7)
Neutrophils Relative %: 74 %
Platelets: 189 10*3/uL (ref 150–400)
RBC: 2.94 MIL/uL — ABNORMAL LOW (ref 3.87–5.11)
RDW: 17.3 % — ABNORMAL HIGH (ref 11.5–15.5)
WBC: 8.9 10*3/uL (ref 4.0–10.5)
nRBC: 0 % (ref 0.0–0.2)

## 2021-01-07 LAB — LIPID PANEL
Cholesterol: 92 mg/dL (ref 0–200)
HDL: 24 mg/dL — ABNORMAL LOW (ref >40)
LDL Cholesterol: 49 mg/dL (ref 0–99)
Total CHOL/HDL Ratio: 3.8 RATIO
Triglycerides: 95 mg/dL (ref <150)
VLDL: 19 mg/dL (ref 0–40)

## 2021-01-07 LAB — GLUCOSE, CAPILLARY
Glucose-Capillary: 86 mg/dL (ref 70–99)
Glucose-Capillary: 101 mg/dL — ABNORMAL HIGH (ref 70–99)
Glucose-Capillary: 86 mg/dL (ref 70–99)
Glucose-Capillary: 126 mg/dL — ABNORMAL HIGH (ref 70–99)
Glucose-Capillary: 113 mg/dL — ABNORMAL HIGH (ref 70–99)

## 2021-01-07 LAB — HEMOGLOBIN A1C
Hgb A1c MFr Bld: 14.1 % — ABNORMAL HIGH (ref 4.8–5.6)
Mean Plasma Glucose: 357.97 mg/dL

## 2021-01-07 LAB — CSF CULTURE W GRAM STAIN: Culture: NO GROWTH

## 2021-01-07 MED ORDER — SODIUM CHLORIDE 0.9 % IV SOLN: INTRAVENOUS | Status: DC

## 2021-01-07 MED ORDER — LORAZEPAM 2 MG/ML IJ SOLN: 1.0000 mg | Freq: Once | INTRAMUSCULAR | Status: DC

## 2021-01-07 MED ORDER — BOOST / RESOURCE BREEZE PO LIQD CUSTOM
1.0000 | Freq: Three times a day (TID) | ORAL | Status: DC
  Administered 2021-01-07 – 2021-01-11 (×10): 1 via ORAL
  Filled 2021-01-07 (×3): qty 1

## 2021-01-07 NOTE — Progress Notes (Signed)
PROGRESS NOTE    [name]  [id number] DOB: 16-May-1976 DOA: 01/04/2021 PCP: [facility]    Brief Narrative:  45 year old female with extensive medical problems including chronic combined heart failure, type 2 diabetes on insulin, hypertension, COPD, GERD, seizure disorder, bipolar disorder, history of substance abuse, sensory neuropathy who was admitted to [facility] since 3/8 where she presented with confusion of about 1 day.  Patient was unable to provide any history.  Her mother who lives with her able to give history that she was at her usual self 2 days prior to admission.  At the outside hospital, she was found to be in HHS with blood sugars more than 1200.  Treated with insulin drip then subsequently changed to subcu insulin with no improvement of mentation.  She started spiking fever, blood culture reportedly with E. coli, urine culture not reported.  Treated with ceftriaxone.  With persistent encephalopathy despite treatment [facility]. Mental status improving.  MRI did show multiple embolic stroke.   Assessment & Plan:   Principal Problem:   Metabolic encephalopathy Active Problems:   Bipolar disorder, unspecified ([name])   DM type 2 (diabetes mellitus, type 2)- poorly controlled   Ulcer of great toe, left, with fat layer exposed ([place])   Essential hypertension   Chronic systolic CHF (congestive heart failure) (HCC)   PVD (peripheral vascular disease) ([place])   Acute kidney injury superimposed on CKD ([place])   Bacteremia due to Escherichia coli   Acute metabolic encephalopathy  Acute metabolic encephalopathy: Multifactorial encephalopathy.   HSS corrected but mental status did not correct hence transferred to [facility]. Her mental status is gradually improving and returning to normal.  Suspected prolonged delirium. Acute bacterial meningitis ruled out.  HSV negative.  Discontinue all antibiotics  and antiviral except treatment for E. coli bacteremia. No focal deficits. All-time fall precautions.  Delirium precautions.  Patient on multiple mental health medications, mood stabilizers.  Resumed on lower doses.  Acute multifocal cardioembolic stroke: Rule out infective endocarditis/infective emboli. MRI 3/16 shows acute multifocal cardioembolic stroke.  Patient with history of peripheral vascular disease, previously used to be on Eliquis, was taken off.  May have underlying cardioembolic source. Currently neurologically improving.  No focal deficits except generalized weakness. 2D echocardiogram with severe valvular heart disease but no evidence of thrombosis or infection. MRA of the head and neck with no obvious large vessel occlusion. Requested TEE with cardiology and is scheduled for tomorrow. On aspirin and statin.  Suspected meningo-encephalitis: Ruled out.  E. coli bacteremia: Blood cultures 3/8 , urine cultures 3/8 outside hospital with E. coli pansensitive.  Repeat cultures negative.  CT scan of the abdomen pelvis without any evidence of hydronephrosis or obstruction. Continue Rocephin, will treat total 10 days of therapy.  Repeat cultures done 3/13 on admission. Patient has Foley catheter in place, will remove once she is more mobile.  Acute kidney injury with history of chronic kidney disease stage IIIa: Baseline creatinine about 1.4.  Gradually improving.  Urine output is improving.  Levels are improving.  Hyperchloremic hypernatremia: Due to total free water deficit.  On half-normal saline now.  We will continue until she can maintain by mouth.  Type 2 diabetes with HHS: Blood sugars fairly controlled.  Hemoglobin A1c is 14.   Now with improving oral intake, increased dose of long-acting insulin.  Will uptitrate gradually.  Chronic systolic heart failure: Compensated.  Holding diuretics.  Peripheral vascular disease/peripheral arterial stents:  Mother reported that patient  is off anticoagulation now.  No evidence of active ischemia.  She may need to go back on Eliquis with cardioembolic stroke.  Will await transesophageal echocardiogram.  History of bipolar disorder/peripheral neuropathy: Patient on  amitriptyline 150 mg daily Gabapentin 600 mg 3 times a day Seroquel 800 mg at night Venlafaxine 150 mg daily. With altered mental status, all her medications were held.  Gabapentin resumed on third of the dose , Seroquel half the dose.  Venlafaxine resumed.      DVT prophylaxis: heparin injection 5,000 Units Start: 01/04/21 0600   Code Status: Full code Family Communication: Mother at bedside  Disposition Plan: Status is: Inpatient  Remains inpatient appropriate because:IV treatments appropriate due to intensity of illness or inability to take PO and Inpatient level of care appropriate due to severity of illness   Dispo: The patient is from: Home              Anticipated d/c is to: SNF              Patient currently is not medically stable to d/c.   Difficult to place patient No         Consultants:   Neurology  Procedures:   Lumbar puncture  EEG  Antimicrobials:  Antibiotics Given (last 72 hours)    Date/Time Action Medication Dose Rate   01/04/21 2151 New Bag/Given   cefTRIAXone (ROCEPHIN) 2 g in sodium chloride 0.9 % 100 mL IVPB 2 g 200 mL/hr   01/04/21 2152 New Bag/Given   acyclovir (ZOVIRAX) 750 mg in dextrose 5 % 150 mL IVPB 750 mg 165 mL/hr   01/05/21 1349 New Bag/Given   acyclovir (ZOVIRAX) 750 mg in dextrose 5 % 150 mL IVPB 750 mg 165 mL/hr   01/05/21 1559 New Bag/Given   cefTRIAXone (ROCEPHIN) 2 g in sodium chloride 0.9 % 100 mL IVPB 2 g 200 mL/hr   01/05/21 2246 New Bag/Given   acyclovir (ZOVIRAX) 750 mg in dextrose 5 % 150 mL IVPB 750 mg 165 mL/hr   01/06/21 1132 New Bag/Given   cefTRIAXone (ROCEPHIN) 2 g in sodium chloride 0.9 % 100 mL IVPB 2 g 200 mL/hr   01/07/21 1109 New Bag/Given   cefTRIAXone (ROCEPHIN) 2 g in  sodium chloride 0.9 % 100 mL IVPB 2 g 200 mL/hr         Subjective: Patient seen and examined.  She was given 1 mg of Ativan scheduled for MRI and was sleepy.  Her mother arrived later.  She was awakened, she was appropriate answering.  Feels weak but denies any complaints.  Objective: Vitals:   01/07/21 0320 01/07/21 0802 01/07/21 1047 01/07/21 1130  BP: 110/66 102/75 118/80 113/76  Pulse: 84 92 99 99  Resp: '16 15  18  '$ Temp:  98 F (36.7 C)  98 F (36.7 C)  TempSrc:  Oral  Oral  SpO2: 98% 99%  94%  Weight:      Height:        Intake/Output Summary (Last 24 hours) at 01/07/2021 1339 Last data filed at 01/07/2021 1100 Gross per 24 hour  Intake 1590 ml  Output 1500 ml  Net 90 ml   Filed Weights   01/04/21 0808 01/05/21 0413 01/07/21 0208  Weight: 95.7 kg 96.9 kg 97.7 kg    Examination:  General: Frail and debilitated.  She looks older than his stated age.  Chronically sick looking.  On room air.  Not in any distress. Cardiovascular: S1-S2  normal.  Regular rate rhythm.  No murmurs. Respiratory: Bilateral clear. Gastrointestinal: Soft and nontender.  Bowel sounds present.  Foley catheter with clear urine. Ext: 1+ edema both upper extremities. Neuro: Cranial nerves no deficit.  She has generalized weakness all 4 extremities but no focal deficits. 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  She has nonhealing ulcer on the left plantar aspect.  No cyanosis or edema. Catheters: Foley catheter in. PICC line: Left arm PICC line present.     Data Reviewed: I have personally reviewed following labs and imaging studies  CBC: Recent Labs  Lab 01/04/21 0313 01/05/21 0149 01/06/21 0525 01/07/21 0555  WBC 9.7 6.3 10.6* 8.9  NEUTROABS 7.9* 4.8 8.4* 6.7  HGB 9.5* 7.8* 8.5* 8.0*  HCT 30.9* 25.6* 26.7* 25.8*  MCV 86.6 89.2 87.0 87.8  PLT 143* 119* 180 [id number]   Basic Metabolic Panel: Recent Labs  Lab 01/04/21 0313 01/05/21 0149 01/06/21 0525 01/07/21 0555   NA 150* 150* 142 140  K 4.2 4.0 3.6 4.0  CL 124* 125* 116* 116*  CO2 20* 21* 19* 19*  GLUCOSE 209* 209* 141* 79  BUN 43* 42* 37* 31*  CREATININE 2.88* 2.78* 2.54* 2.36*  CALCIUM 8.2* 7.8* 7.8* 7.8*  MG  --  1.6*  --   --   PHOS  --  3.4  --   --    GFR: Estimated Creatinine Clearance: 37.4 mL/min (A) (by C-G formula based on SCr of 2.36 mg/dL (H)). Liver Function Tests: Recent Labs  Lab 01/04/21 0313 01/05/21 0149 01/06/21 0525 01/07/21 0555  AST 15 10* 11* 9*  ALT '11 10 10 8  '$ ALKPHOS 88 62 71 62  BILITOT 0.3 0.6 0.7 0.7  PROT 5.9* 4.9* 5.5* 5.1*  ALBUMIN 1.6* 1.3* 1.6* 1.4*   No results for input(s): LIPASE, AMYLASE in the last 168 hours. Recent Labs  Lab 01/05/21 1300  AMMONIA 20   Coagulation Profile: No results for input(s): INR, PROTIME in the last 168 hours. Cardiac Enzymes: No results for input(s): CKTOTAL, CKMB, CKMBINDEX, TROPONINI in the last 168 hours. BNP (last 3 results) No results for input(s): PROBNP in the last 8760 hours. HbA1C: Recent Labs    01/07/21 0555  HGBA1C 14.1*   CBG: Recent Labs  Lab 01/06/21 1944 01/06/21 2323 01/07/21 0314 01/07/21 0735 01/07/21 1129  GLUCAP 165* 126* 86 101* 86   Lipid Profile: Recent Labs    01/07/21 0555  CHOL 92  HDL 24*  LDLCALC 49  TRIG 95  CHOLHDL 3.8   Thyroid Function Tests: No results for input(s): TSH, T4TOTAL, FREET4, T3FREE, THYROIDAB in the last 72 hours. Anemia Panel: No results for input(s): VITAMINB12, FOLATE, FERRITIN, TIBC, IRON, RETICCTPCT in the last 72 hours. Sepsis Labs: No results for input(s): PROCALCITON, LATICACIDVEN in the last 168 hours.  Recent Results (from the past 240 hour(s))  CSF culture w Gram Stain     Status: None   Collection Time: 01/04/21 11:07 AM   Specimen: PATH Cytology CSF; Cerebrospinal Fluid  Result Value Ref Range Status   Specimen Description CSF  Final   Special Requests NONE  Final   Gram Stain   Final    WBC PRESENT, PREDOMINANTLY  MONONUCLEAR NO ORGANISMS SEEN CYTOSPIN SMEAR    Culture   Final    NO GROWTH 3 DAYS Performed at [facility], [address] [place], [place] [address]    Report Status 01/07/2021 FINAL  Final  Anaerobic culture w Gram  Stain     Status: None (Preliminary result)   Collection Time: 01/04/21 11:07 AM   Specimen: PATH Cytology CSF; Cerebrospinal Fluid  Result Value Ref Range Status   Specimen Description CSF  Final   Special Requests NONE  Final   Gram Stain PENDING  Incomplete   Culture   Final    NO GROWTH 2 DAYS NO ANAEROBES ISOLATED; CULTURE IN PROGRESS FOR 5 DAYS Performed at [facility], [place] [address] [place], [place] [address]    Report Status PENDING  Incomplete         Radiology Studies: MR ANGIO HEAD WO CONTRAST  Result Date: 01/07/2021 CLINICAL DATA:  Stroke follow-up. EXAM: MRA NECK WITHOUT CONTRAST MRA HEAD WITHOUT CONTRAST TECHNIQUE: Angiographic images of the neck were obtained using MRA technique without intravenous contrast; Angiographic images of the Circle of Willis were obtained using MRA technique without intravenous contrast. COMPARISON:  MRI of the brain January 06, 2021. FINDINGS: Motion degraded study. 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 The visualized subclavian arteries are normal. Right carotid system: Normal course and caliber without stenosis or evidence of dissection. Left carotid system: Normal course and caliber without stenosis or evidence of dissection. Vertebral arteries: Right dominant. Vertebral arteries are normal in course and caliber to the vertebrobasilar confluence without stenosis or evidence of dissection. MRA HEAD FINDINGS The visualized portions of the distal cervical and intracranial internal carotid arteries are widely patent with normal flow related enhancement. The bilateral anterior cerebral arteries and middle cerebral arteries are widely patent with antegrade  flow without high-grade flow-limiting stenosis or proximal branch occlusion. No intracranial aneurysm within the anterior circulation. The vertebral arteries are widely patent with antegrade flow. Vertebrobasilar junction and basilar artery are widely patent with antegrade flow without evidence of basilar stenosis or aneurysm. Posterior cerebral arteries are normal bilaterally, noting that the right posterior cerebral artery origin is directly from the right ICA (fetal PCA). No intracranial aneurysm within the posterior circulation. IMPRESSION: Normal MRA of the head and neck. Electronically Signed   By: Pedro Earls M.D.   On: 01/07/2021 12:04   MR ANGIO NECK WO CONTRAST  Result Date: 01/07/2021 CLINICAL DATA:  Stroke follow-up. EXAM: MRA NECK WITHOUT CONTRAST MRA HEAD WITHOUT CONTRAST TECHNIQUE: Angiographic images of the neck were obtained using MRA technique without intravenous contrast; Angiographic images of the Circle of Willis were obtained using MRA technique without intravenous contrast. COMPARISON:  MRI of the brain January 06, 2021. FINDINGS: Motion degraded study. 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 The visualized subclavian arteries are normal. Right carotid system: Normal course and caliber without stenosis or evidence of dissection. Left carotid system: Normal course and caliber without stenosis or evidence of dissection. Vertebral arteries: Right dominant. Vertebral arteries are normal in course and caliber to the vertebrobasilar confluence without stenosis or evidence of dissection. MRA HEAD FINDINGS The visualized portions of the distal cervical and intracranial internal carotid arteries are widely patent with normal flow related enhancement. The bilateral anterior cerebral arteries and middle cerebral arteries are widely patent with antegrade flow without high-grade flow-limiting stenosis or proximal branch occlusion.  No intracranial aneurysm within the anterior circulation. The vertebral arteries are widely patent with antegrade flow. Vertebrobasilar junction and basilar artery are widely patent with antegrade flow without evidence of basilar stenosis or aneurysm. Posterior cerebral arteries are normal bilaterally, noting that the right posterior  cerebral artery origin is directly from the right ICA (fetal PCA). No intracranial aneurysm within the posterior circulation. IMPRESSION: Normal MRA of the head and neck. Electronically Signed   By: Pedro Earls M.D.   On: 01/07/2021 12:04   MR BRAIN WO CONTRAST  Result Date: 01/06/2021 CLINICAL DATA:  Mental status change. Unknown cause of encephalopathy. EXAM: MRI HEAD WITHOUT CONTRAST TECHNIQUE: Multiplanar, multiecho pulse sequences of the brain and surrounding structures were obtained without intravenous contrast. COMPARISON:  Head CT January 02, 2021 FINDINGS: Brain: Foci of restricted diffusion are seen in the bilateral frontal lobes, right parietal and temporal lobe, left occipital lobe and on the left side of the pons, suggestive of embolic infarcts. Remote infarct in the left frontal lobe. No hemorrhage, hydrocephalus, extra-axial collection or mass lesion. Vascular: Normal flow voids. Skull and upper cervical spine: Normal marrow signal. Sinuses/Orbits: Negative. Other: Minimal bilateral mastoid effusion. Remote infarct in the left frontal lobe IMPRESSION: 1. Foci of restricted diffusion in the bilateral frontal lobes, right parietal and temporal lobe, left occipital lobe and left side of the pons, suggestive of embolic acute infarcts. 2. Remote infarct in the left frontal lobe. These results will be called to the ordering clinician or representative by the Radiologist Assistant, and communication documented in the PACS or [employer]. Electronically Signed   By: Pedro Earls M.D.   On: 01/06/2021 10:26   ECHOCARDIOGRAM  COMPLETE  Result Date: 01/06/2021    ECHOCARDIOGRAM REPORT   Patient Name:   NIKISHIA MCMORRIS Date of Exam: 01/06/2021 Medical Rec #:  [id number]       Height:       69.0 in Accession #:    [id number]      Weight:       213.6 lb Date of Birth:  11/22/1975       BSA:          2.125 m Patient Age:    62 years        BP:           93/60 mmHg Patient Gender: F               HR:           100 bpm. Exam Location:  Inpatient Procedure: 2D Echo, 3D Echo, Cardiac Doppler, Color Doppler and Intracardiac            Opacification Agent Indications:    Barb Merino,  History:        Patient has prior history of Echocardiogram examinations, most                 recent 03/31/2020. CHF; Risk Factors:Hypertension and Diabetes.                 Acute kidney injury superimposed on CKD                 Bacteremia due to Escherichia coli                 Acute metabolic encephalopathy.  Sonographer:    Darlina Sicilian RDCS Referring Phys: [id number] [employer]  1. 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 PHT is not accuate in the setting of tachycardia. MVA by VTI 0.6 cm2 consistent with severe stenosis. Severe MR also makes VTI and gradient higher than expected. There is severe mitral regurgitation with pulmonary vein flow reversal in  the left upper PV in systole. A TEE is recommended for better characterization of the MV and associated pathology. The MS and MR appear simliar to prior study 03/31/2020. 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  2. Left ventricular ejection fraction, by estimation, is 45 to 50%. Left ventricular ejection fraction by 3D volume is 45 %. The left ventricle has mildly decreased function. The left ventricle demonstrates global hypokinesis. Left ventricular diastolic  function could not be evaluated.  3. Right ventricular systolic function is low normal. The right ventricular size is mildly enlarged.  There is moderately elevated pulmonary artery systolic pressure. The estimated right ventricular systolic pressure is [id number] mmHg.  4. The aortic valve is tricuspid. Aortic valve regurgitation is not visualized. No aortic stenosis is present.  5. The inferior vena cava is dilated in size with <50% respiratory variability, suggesting right atrial pressure of 15 mmHg. Comparison(s): No significant change from prior study. EF ~45%. Moderate to severe MS and severe MR remain. FINDINGS  Left Ventricle: Left ventricular ejection fraction, by estimation, is 45 to 50%. Left ventricular ejection fraction by 3D volume is 45 %. The left ventricle has mildly decreased function. The left ventricle demonstrates global hypokinesis. Definity contrast agent was given IV to delineate the left ventricular endocardial borders. The left ventricular internal cavity size was normal in size. There is no left ventricular hypertrophy. Left ventricular diastolic function could not be evaluated due to mitral stenosis. Left ventricular diastolic function could not be evaluated. Right Ventricle: The right ventricular size is mildly enlarged. No increase in right ventricular wall thickness. Right ventricular systolic function is low normal. There is moderately elevated pulmonary artery systolic pressure. The tricuspid regurgitant  velocity is 3.03 m/s, and with an assumed right atrial pressure of 15 mmHg, the estimated right ventricular systolic pressure is [id number] mmHg. Left Atrium: Left atrial size was normal in size. Right Atrium: Right atrial size was normal in size. Pericardium: There is no evidence of pericardial effusion. 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 PHT is not accuate in the setting of tachycardia. MVA by VTI 0.6 cm2 consistent with severe stenosis. Severe MR also makes  VTI and gradient higher than expected. There is severe mitral  regurgitation with pulmonary vein flow reversal in the left upper PV in systole. A TEE is recommended for better characterization of the MV and associated pathology. The MS and MR appear simliar to prior study 03/31/2020. The mitral valve is rheumatic. Severe mitral valve regurgitation, with posteriorly-directed jet. MV peak gradient, 30.8 mmHg. The mean mitral valve gradient is 9.3 mmHg with average heart rate of 99 bpm. Tricuspid Valve: The tricuspid valve is grossly normal. Tricuspid valve regurgitation is mild . No evidence of tricuspid stenosis. Aortic Valve: The aortic valve is tricuspid. Aortic valve regurgitation is not visualized. No aortic stenosis is present. Pulmonic Valve: The pulmonic valve was grossly normal. Pulmonic valve regurgitation is not visualized. No evidence of pulmonic stenosis. Aorta: The aortic root and ascending aorta are structurally normal, with no evidence of dilitation. Venous: The inferior vena cava is dilated in size with less than 50% respiratory variability, suggesting right atrial pressure of 15 mmHg. IAS/Shunts: The atrial septum is grossly normal.  LEFT VENTRICLE PLAX 2D LVIDd:         5.40 cm         Diastology LVIDs:  4.20 cm         LV e' medial:    5.96 cm/s LV PW:         1.00 cm         LV E/e' medial:  29.3 LV IVS:        1.00 cm         LV e' lateral:   8.59 cm/s LVOT diam:     1.80 cm         LV E/e' lateral: 20.3 LV SV:         28 LV SV Index:   13 LVOT Area:     2.54 cm        3D Volume EF                                LV 3D EF:    Left                                             ventricular LV Volumes (MOD)                            ejection LV vol d, MOD    161.0 ml                   fraction by A2C:                                        3D volume LV vol d, MOD    122.0 ml                   is 45 %. A4C: LV vol s, MOD    92.8 ml A2C:                           3D Volume EF: LV vol s, MOD    59.9 ml       3D EF:        45 % A4C:                           LV  EDV:       179 ml LV SV MOD A2C:   68.2 ml       LV ESV:       98 ml LV SV MOD A4C:   122.0 ml      LV SV:        81 ml LV SV MOD BP:    66.7 ml RIGHT VENTRICLE RV S prime:     8.76 cm/s TAPSE (M-mode): 0.8 cm LEFT ATRIUM             Index       RIGHT ATRIUM          Index LA diam:        4.60 cm 2.17 cm/m  RA Area:     9.27 cm LA Vol (A2C):   38.0 ml 17.91 ml/m RA Volume:   15.50 ml 7.30 ml/m LA Vol (A4C):   33.1 ml 15.58 ml/m LA Biplane Vol: 33.8  ml 15.91 ml/m  AORTIC VALVE LVOT Vmax:   88.60 cm/s LVOT Vmean:  57.700 cm/s LVOT VTI:    0.110 m  AORTA Ao Root diam: 2.70 cm Ao Asc diam:  2.60 cm MITRAL VALVE                 TRICUSPID VALVE MV Area (PHT): 2.93 cm      TR Peak grad:   36.7 mmHg MV Area VTI:   0.61 cm      TR Vmax:        303.00 cm/s MV Peak grad:  30.8 mmHg MV Mean grad:  9.3 mmHg      SHUNTS MV Vmax:       2.78 m/s      Systemic VTI:  0.11 m MV Vmean:      199.8 cm/s    Systemic Diam: 1.80 cm MV VTI:        0.46 m MV Decel Time: 259 msec MR Peak grad:    91.4 mmHg MR Mean grad:    58.5 mmHg MR Vmax:         478.00 cm/s MR Vmean:        364.0 cm/s MR PISA:         3.53 cm MR PISA Eff ROA: 19 mm MR PISA Radius:  0.75 cm MV E velocity: 174.67 cm/s MV A velocity: 145.00 cm/s MV E/A ratio:  1.20 Eleonore Chiquito MD Electronically signed by Eleonore Chiquito MD Signature Date/Time: 01/06/2021/3:23:18 PM    Final         Scheduled Meds: . aspirin  300 mg Rectal Daily   Or  . aspirin  325 mg Oral Daily  . Chlorhexidine Gluconate Cloth  6 each Topical Daily  . divalproex  500 mg Oral TID  . feeding supplement  237 mL Oral TID BM  . gabapentin  200 mg Oral Q8H  . heparin  5,000 Units Subcutaneous Q8H  . insulin aspart  4 Units Subcutaneous TID WC  . insulin glargine  10 Units Subcutaneous BID  . LORazepam  1 mg Intravenous Once  . metoprolol tartrate  12.5 mg Oral BID  . multivitamin with minerals  1 tablet Oral Daily  . QUEtiapine  400 mg Oral QHS  . venlafaxine XR  75 mg Oral Daily    Continuous Infusions: . sodium chloride 100 mL/hr at 01/07/21 1042  . cefTRIAXone (ROCEPHIN)  IV 2 g (01/07/21 1109)     LOS: 3 days    Time spent: 30 minutes    Barb Merino, MD [facility] Pager [phone]

## 2021-01-07 NOTE — Progress Notes (Signed)
Calorie Count Note  48 hour calorie count ordered.  Diet: dysphagia 3 with thin liquids  Supplements: Ensure TID, Magic Cup TID  Day 1 Results (01/06/21): No meal tickets available in envelope. RN reports pt did not eat anything and does not like Ensure supplements. Will trial additional supplements.   Total intake: 0 kcal (0% of minimum estimated needs)  0 protein (0% of minimum estimated needs)  Nutrition Dx: Inadequate oral intake related to lethargy/confusion as evidenced by meal completion < 50%. -- ongoing  Goal: Patient will meet greater than or equal to 90% of their needs - not met  Intervention:   Continue calorie count   Staff to continue to assist pt with meals/supplements (if pt's mother is not available)  D/c Ensure Enlive  Boost Breeze po TID, each supplement provides 250 kcal and 9 grams of protein  Continue Magic cup TID with meals, each supplement provides 290 kcal and 9 grams of protein  Continue MVI with minerals daily   Larkin Ina, MS, RD, LDN RD pager number and weekend/on-call pager number located in [place].

## 2021-01-07 NOTE — Progress Notes (Signed)
Subjective: Continues to improve. Mother reports "not quite back to normal, but close"  Exam: Vitals:   01/07/21 1047 01/07/21 1130  BP: 118/80 113/76  Pulse: 99 99  Resp:  18  Temp:  98 F (36.7 C)  SpO2:  94%   Gen: In bed, NAD Resp: non-labored breathing, no acute distress Abd: soft, nt  Neuro: MS: awake, alert, oriented to place, month year.   [id number], EOMI with saccadic smooth pursuit, VFF, slightly flattened NL fold Motor: 4+/5 in the right arm and leg, 4/5 in the left arm and leg.  Sensory: reduced in left leg to LT, otherwise intact.   MRA head and neck - normal   Impression: 45 yo F with a multifactorial toxic/metabolic encephalopathy including renal failure, gram-negative bacteremia, hyperosmolar state initially that is improving. In addition, multifocal small punctate embolic infarcts were found on MRI. She has a history of endocarditis, and this will need to be investigated as well. Other causes of cardioembolic phenomena are also possible. Will get MRA head and neck also.   Recommendations: 1) TEE 2) Cardiac monitoring 3) ASA for now, though if endocarditis is found, can d/c this.  4) Stroke team will follow.   Roland Rack, MD [facility] [phone]  If 7pm- 7am, please page neurology on call as listed in [place].

## 2021-01-07 NOTE — Progress Notes (Signed)
  Appropriate Use Committee Chart Review  Chart reviewed by the physician advisor with input from [facility] and the attending MD as needed for review of the appropriateness for SNF referral.  TOC notes, PT/OT/ST notes, nursing notes and physician notes reviewed for medical necessity to determine if the patient's needs are appropriate for short-term rehab to return to a prior level of function versus the likely need for custodial care.  At this time, the patient appears to meet Medicare criteria for SNF placement for short-term rehab.   Recommendations: The patient is SNF appropriate for short-term rehab/skilled nursing interventions.   A consult to the Transitions of Care Team should be made to facilitate placement.    Jacquelynn Cree, MD Chief Physician Advisor

## 2021-01-07 NOTE — Progress Notes (Signed)
    [facility] has been requested to perform a transesophageal echocardiogram on Kinzly A Bonaparte for bacteremia.  After careful review of history and examination, the risks and benefits of transesophageal echocardiogram have been explained including risks of esophageal damage, perforation (1:10,000 risk), bleeding, pharyngeal hematoma as well as other potential complications associated with conscious sedation including aspiration, arrhythmia, respiratory failure and death. Alternatives to treatment were discussed, questions were answered. Patient is willing to proceed.   Pt scheduled 2pm 01/08/21, NPO at [place].  Tami Lin Baraa Tubbs, [place]  01/07/2021 2:48 PM
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
## 2021-01-07 NOTE — Progress Notes (Signed)
  Speech Language Pathology Treatment: Dysphagia  Patient Details Name: [name] MRN: [id number] DOB: 21-Aug-1976 Today's Date: 01/07/2021 Time: 1100-1115 SLP Time Calculation (min) (ACUTE ONLY): 15 min  Assessment / Plan / Recommendation Clinical Impression  Followed up for upgraded PO trials. Pt with improved sustained alertness this date as compared to prior SLP interactions per chart review. Pt with baseline edentulous status, reports she was able to "gum" anything, mother present and agreed regarding PLOF for swallowing. Assessed with thins via straw and mechanical soft and regular textures (including sandwich brought by pts mom). Pt with prolonged mastication with regular texture though able to adequately clear oral cavity with extended time. Per RN, pt requiring meds crushed in puree as difficulty exhibited when presented whole in puree. Recommend diet advancement to mechanical soft with continued thin liquids. SLP to follow up.      HPI HPI: 45 year old female with extensive medical problems including chronic gastric heart failure, type 2 diabetes on insulin, hypertension, COPD, GERD, seizure disorder, bipolar disorder, history of substance abuse, sensory neuropathy who was admitted to [facility], then transferred to [facility] with neurology consultation. Dx metabolic encephalopathy, E. coli bacteremia, hypernatremia, AKI. 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      SLP Plan  Continue with current plan of care       Recommendations  Diet recommendations: Dysphagia 3 (mechanical soft);Thin liquid Liquids provided via: Cup;Straw Medication Administration: Crushed with puree (per RN pt with difficulty whole in puree) Supervision: Staff to assist with self feeding;Full  supervision/cueing for compensatory strategies Compensations: Slow rate;Small sips/bites;Minimize environmental distractions;Follow solids with liquid Postural Changes and/or Swallow Maneuvers: Seated upright 90 degrees;Upright 30-60 min after meal                General recommendations: Rehab consult Oral Care Recommendations: Oral care BID Follow up Recommendations: Skilled Nursing facility;Inpatient Rehab SLP Visit Diagnosis: Dysphagia, oral phase (R13.11) Plan: Continue with current plan of care       GO                Hayden Rasmussen MA, CCC-SLP Acute Rehabilitation Services   01/07/2021, 11:30 AM

## 2021-01-07 NOTE — Progress Notes (Signed)
Physical Therapy Treatment Patient Details Name: [name] MRN: [id number] DOB: Feb 19, 1976 Today's Date: 01/07/2021    History of Present Illness Pt adm from [facility] on [id number] with metabolic encephalopathy. MRI while at [facility] showing multiple embolic strokes. PMH - DM, peripheral neuropathy, diabetic foot ulcers, lt transmet amupation, amputation of rt 4th and 5th toes, bipolar, ptsd, copd, htn, chf, seizure disorder, polysubstance abuse    PT Comments    pt fully participated in session. Pt inconsistent with assist needed during sit<>stand transfers. Pt with decreased awareness of L UE placement and required assist and support at B LEs to initiate weight bearing for transfer. Pt demonstrating deficits in strength, coordination, balance, endurance, safety, bed mobility and cognition. Pt will benefit from skilled PT to address deficits to maximize independence with functional mobility prior to discharge. PT recommending CIR for increased therapy, family requesting SNF to be closer to home.   Follow Up Recommendations  CIR;SNF     Equipment Recommendations  None recommended by PT    Recommendations for Other Services       Precautions / Restrictions Precautions Precautions: Fall Restrictions Weight Bearing Restrictions: No    Mobility  Bed Mobility Overal bed mobility: Needs Assistance Bed Mobility: Supine to Sit;Sit to Supine     Supine to sit: Mod assist;HOB elevated Sit to supine: Mod assist   General bed mobility comments: Assist for trunk    Transfers Overall transfer level: Needs assistance   Transfers: Sit to/from Stand           General transfer comment: PT attempted multiple sit<>stand from EOB wtih pt unable to clear buttocks, transitioned to stedy wtih RN assist requiring 4 trials and bed elevated. Following transition to chair therapist performed one additional sit>stand with pt demonstrating improved participation requiring mod A to  transition. During all transfers requiring blocking/support at B knees to maintain weight bearing  Ambulation/Gait                 Stairs             Wheelchair Mobility    Modified Rankin (Stroke Patients Only) Modified Rankin (Stroke Patients Only) Pre-Morbid Rankin Score: No significant disability Modified Rankin: Severe disability     Balance                                            Cognition Arousal/Alertness: Awake/alert Behavior During Therapy: WFL for tasks assessed/performed Overall Cognitive Status: Impaired/Different from baseline Area of Impairment: Orientation;Attention;Memory;Following commands;Safety/judgement;Problem solving;Awareness                 Orientation Level: Disoriented to;Situation Current Attention Level: Sustained Memory: Decreased short-term memory Following Commands: Follows one step commands with increased time Safety/Judgement: Decreased awareness of safety;Decreased awareness of deficits Awareness: Intellectual Problem Solving: Slow processing;Decreased initiation;Requires verbal cues;Requires tactile cues;Difficulty sequencing        Exercises      General Comments General comments (skin integrity, edema, etc.): VSS      Pertinent Vitals/Pain Pain Assessment: No/denies pain    Home Living     Available Help at Discharge: Family;Available 24 hours/day Type of Home: House              Prior Function            PT Goals (current goals can now be found in the care plan  section) Acute Rehab PT Goals Patient Stated Goal: Pt wanting to get better; motehr approves of post acute rehab PT Goal Formulation: With patient/family Time For Goal Achievement: 01/19/21 Potential to Achieve Goals: Good Progress towards PT goals: Progressing toward goals    Frequency    Min 3X/week      PT Plan Current plan remains appropriate    Co-evaluation              AM-PAC PT "6  Clicks" Mobility   Outcome Measure  Help needed turning from your back to your side while in a flat bed without using bedrails?: A Little Help needed moving from lying on your back to sitting on the side of a flat bed without using bedrails?: A Little Help needed moving to and from a bed to a chair (including a wheelchair)?: Total Help needed standing up from a chair using your arms (e.g., wheelchair or bedside chair)?: A Lot Help needed to walk in hospital room?: Total Help needed climbing 3-5 steps with a railing? : Total 6 Click Score: 11    End of Session Equipment Utilized During Treatment: Gait belt Activity Tolerance: Patient tolerated treatment well Patient left: in chair;with call bell/phone within reach;with chair alarm set   PT Visit Diagnosis: Other abnormalities of gait and mobility (R26.89);Muscle weakness (generalized) (M62.81)     Time: [id number] PT Time Calculation (min) (ACUTE ONLY): 33 min  Charges:  $Therapeutic Activity: 23-37 mins                     Lyanne Co, DPT Acute Rehabilitation Services [id number]   [place] 01/07/2021, 2:10 PM

## 2021-01-08 ENCOUNTER — Encounter (HOSPITAL_COMMUNITY)
Admission: AD | Disposition: A | Discharge status: Skilled Nursing Facility | Payer: Self-pay | Attending: Internal Medicine

## 2021-01-08 ENCOUNTER — Encounter (HOSPITAL_COMMUNITY): Payer: Self-pay | Admitting: Family Medicine

## 2021-01-08 ENCOUNTER — Inpatient Hospital Stay (HOSPITAL_COMMUNITY): Payer: Medicaid Other | Admitting: Certified Registered Nurse Anesthetist

## 2021-01-08 ENCOUNTER — Inpatient Hospital Stay (HOSPITAL_COMMUNITY): Payer: Medicaid Other

## 2021-01-08 DIAGNOSIS — G9341 Metabolic encephalopathy: Secondary | ICD-10-CM | POA: Diagnosis not present

## 2021-01-08 DIAGNOSIS — I6330 Cerebral infarction due to thrombosis of unspecified cerebral artery: Secondary | ICD-10-CM | POA: Insufficient documentation

## 2021-01-08 DIAGNOSIS — B9620 Unspecified Escherichia coli [E. coli] as the cause of diseases classified elsewhere: Secondary | ICD-10-CM | POA: Diagnosis not present

## 2021-01-08 DIAGNOSIS — I340 Nonrheumatic mitral (valve) insufficiency: Secondary | ICD-10-CM

## 2021-01-08 DIAGNOSIS — I342 Nonrheumatic mitral (valve) stenosis: Secondary | ICD-10-CM

## 2021-01-08 HISTORY — PX: TEE WITHOUT CARDIOVERSION: SHX5443

## 2021-01-08 HISTORY — PX: BUBBLE STUDY: SHX6837

## 2021-01-08 LAB — CBC WITH DIFFERENTIAL/PLATELET
Abs Immature Granulocytes: 0.12 10*3/uL — ABNORMAL HIGH (ref 0.00–0.07)
Basophils Absolute: 0 10*3/uL (ref 0.0–0.1)
Basophils Relative: 0 %
Eosinophils Absolute: 0.2 10*3/uL (ref 0.0–0.5)
Eosinophils Relative: 2 %
HCT: 25.2 % — ABNORMAL LOW (ref 36.0–46.0)
Hemoglobin: 7.8 g/dL — ABNORMAL LOW (ref 12.0–15.0)
Immature Granulocytes: 1 %
Lymphocytes Relative: 16 %
Lymphs Abs: 1.4 10*3/uL (ref 0.7–4.0)
MCH: 27.1 pg (ref 26.0–34.0)
MCHC: 31 g/dL (ref 30.0–36.0)
MCV: 87.5 fL (ref 80.0–100.0)
Monocytes Absolute: 0.5 10*3/uL (ref 0.1–1.0)
Monocytes Relative: 6 %
Neutro Abs: 6.8 10*3/uL (ref 1.7–7.7)
Neutrophils Relative %: 75 %
Platelets: 232 10*3/uL (ref 150–400)
RBC: 2.88 MIL/uL — ABNORMAL LOW (ref 3.87–5.11)
RDW: 17.1 % — ABNORMAL HIGH (ref 11.5–15.5)
WBC: 9.1 10*3/uL (ref 4.0–10.5)
nRBC: 0 % (ref 0.0–0.2)

## 2021-01-08 LAB — BASIC METABOLIC PANEL
Anion gap: 5 (ref 5–15)
BUN: 31 mg/dL — ABNORMAL HIGH (ref 6–20)
CO2: 19 mmol/L — ABNORMAL LOW (ref 22–32)
Calcium: 7.9 mg/dL — ABNORMAL LOW (ref 8.9–10.3)
Chloride: 115 mmol/L — ABNORMAL HIGH (ref 98–111)
Creatinine, Ser: 2.27 mg/dL — ABNORMAL HIGH (ref 0.44–1.00)
GFR, Estimated: 26 mL/min — ABNORMAL LOW (ref >60)
Glucose, Bld: 119 mg/dL — ABNORMAL HIGH (ref 70–99)
Potassium: 4.4 mmol/L (ref 3.5–5.1)
Sodium: 139 mmol/L (ref 135–145)

## 2021-01-08 LAB — ECHO TEE
Area-P 1/2: 2.59 cm2
MV M vel: 4.92 m/s
MV Peak grad: 96.8 mmHg
Radius: 0.7 cm

## 2021-01-08 LAB — GLUCOSE, CAPILLARY
Glucose-Capillary: 109 mg/dL — ABNORMAL HIGH (ref 70–99)
Glucose-Capillary: 136 mg/dL — ABNORMAL HIGH (ref 70–99)
Glucose-Capillary: 141 mg/dL — ABNORMAL HIGH (ref 70–99)
Glucose-Capillary: 203 mg/dL — ABNORMAL HIGH (ref 70–99)
Glucose-Capillary: 81 mg/dL (ref 70–99)
Glucose-Capillary: 98 mg/dL (ref 70–99)

## 2021-01-08 SURGERY — ECHOCARDIOGRAM, TRANSESOPHAGEAL
Anesthesia: General

## 2021-01-08 MED ORDER — AMITRIPTYLINE HCL 25 MG PO TABS
50.0000 mg | ORAL_TABLET | Freq: Every day | ORAL | Status: DC
  Administered 2021-01-08: 50 mg via ORAL
  Filled 2021-01-08: qty 2

## 2021-01-08 MED ORDER — VENLAFAXINE HCL ER 75 MG PO CP24
150.0000 mg | ORAL_CAPSULE | Freq: Every day | ORAL | Status: DC
  Administered 2021-01-09: 150 mg via ORAL
  Filled 2021-01-08: qty 2

## 2021-01-08 MED ORDER — PHENYLEPHRINE HCL (PRESSORS) 10 MG/ML IV SOLN
INTRAVENOUS | Status: DC | PRN
  Administered 2021-01-08: 120 ug via INTRAVENOUS

## 2021-01-08 MED ORDER — PROPOFOL 10 MG/ML IV BOLUS
INTRAVENOUS | Status: DC | PRN
  Administered 2021-01-08 (×2): 20 mg via INTRAVENOUS

## 2021-01-08 MED ORDER — DIVALPROEX SODIUM 125 MG PO CSDR
500.0000 mg | DELAYED_RELEASE_CAPSULE | Freq: Three times a day (TID) | ORAL | Status: DC
  Administered 2021-01-08 – 2021-01-09 (×3): 500 mg via ORAL
  Filled 2021-01-08 (×3): qty 4

## 2021-01-08 MED ORDER — GABAPENTIN 600 MG PO TABS
300.0000 mg | ORAL_TABLET | Freq: Three times a day (TID) | ORAL | Status: DC
  Administered 2021-01-08 – 2021-01-09 (×3): 300 mg via ORAL
  Filled 2021-01-08 (×3): qty 1

## 2021-01-08 MED ORDER — PROPOFOL 500 MG/50ML IV EMUL
INTRAVENOUS | Status: DC | PRN
  Administered 2021-01-08: 100 ug/kg/min via INTRAVENOUS

## 2021-01-08 NOTE — Plan of Care (Signed)
Pt has been NPO since midnight for TEE. Wound on LL foot changed. No c/o pain.  Problem: Education: Goal: Expressions of having a comfortable level of knowledge regarding the disease process will increase Outcome: Progressing   Problem: Coping: Goal: Ability to adjust to condition or change in health will improve Outcome: Progressing Goal: Ability to identify appropriate support needs will improve Outcome: Progressing   Problem: Health Behavior/Discharge Planning: Goal: Compliance with prescribed medication regimen will improve Outcome: Progressing   Problem: Medication: Goal: Risk for medication side effects will decrease Outcome: Progressing   Problem: Clinical Measurements: Goal: Complications related to the disease process, condition or treatment will be avoided or minimized Outcome: Progressing Goal: Diagnostic test results will improve Outcome: Progressing   Problem: Safety: Goal: Verbalization of understanding the information provided will improve Outcome: Progressing   Problem: Self-Concept: Goal: Level of anxiety will decrease Outcome: Progressing Goal: Ability to verbalize feelings about condition will improve Outcome: Progressing   Problem: Education: Goal: Knowledge of General Education information will improve Description: Including pain rating scale, medication(s)/side effects and non-pharmacologic comfort measures Outcome: Progressing   Problem: Health Behavior/Discharge Planning: Goal: Ability to manage health-related needs will improve Outcome: Progressing   Problem: Clinical Measurements: Goal: Ability to maintain clinical measurements within normal limits will improve Outcome: Progressing Goal: Will remain free from infection Outcome: Progressing Goal: Diagnostic test results will improve Outcome: Progressing Goal: Respiratory complications will improve Outcome: Progressing Goal: Cardiovascular complication will be avoided Outcome:  Progressing   Problem: Activity: Goal: Risk for activity intolerance will decrease Outcome: Progressing   Problem: Nutrition: Goal: Adequate nutrition will be maintained Outcome: Progressing   Problem: Coping: Goal: Level of anxiety will decrease Outcome: Progressing   Problem: Elimination: Goal: Will not experience complications related to bowel motility Outcome: Progressing Goal: Will not experience complications related to urinary retention Outcome: Progressing   Problem: Pain Managment: Goal: General experience of comfort will improve Outcome: Progressing   Problem: Safety: Goal: Ability to remain free from injury will improve Outcome: Progressing   Problem: Skin Integrity: Goal: Risk for impaired skin integrity will decrease Outcome: Progressing

## 2021-01-08 NOTE — Progress Notes (Signed)
Brief Nutrition Note Regarding Calorie Count  RD went to follow-up on day 2 of calorie count (3/17). No meal tickets from this date. Will continue calorie count and current interventions over the weekend. Note pt has been upgraded to a regular diet. RD will follow-up with results on Monday, 3/21.    Larkin Ina, MS, RD, LDN RD pager number and weekend/on-call pager number located in [place].

## 2021-01-08 NOTE — H&P (View-Only) (Signed)
PROGRESS NOTE    [name]  [id number] DOB: [birth year] DOA: 01/04/2021 PCP: [facility]    Brief Narrative:  45 year old female with extensive medical problems including chronic combined heart failure, type 2 diabetes on insulin, hypertension, COPD, GERD, seizure disorder, bipolar disorder, history of substance abuse, sensory neuropathy who was admitted to [facility] since 3/8 where she presented with confusion of about 1 day.  Patient was unable to provide any history.  Her mother who lives with her able to give history that she was at her usual self 2 days prior to admission.  At the outside hospital, she was found to be in HHS with blood sugars more than 1200.  Treated with insulin drip then subsequently changed to subcu insulin with no improvement of mentation.  She started spiking fever, blood culture reportedly with E. coli, urine culture not reported.  Treated with ceftriaxone.  With persistent encephalopathy despite treatment she was transferred to [facility] with neurology consultation. Mental status improving.  MRI did show multiple embolic stroke.   Assessment & Plan:   Principal Problem:   Metabolic encephalopathy Active Problems:   Bipolar disorder, unspecified ([place])   DM type 2 (diabetes mellitus, type 2)- poorly controlled   Ulcer of great toe, left, with fat layer exposed ([name])   Essential hypertension   Chronic systolic CHF (congestive heart failure) (HCC)   PVD (peripheral vascular disease) ([name])   Acute kidney injury superimposed on CKD ([name])   Bacteremia due to Escherichia coli   Acute metabolic encephalopathy   Cerebral thrombosis with cerebral infarction  Acute metabolic encephalopathy: Multifactorial encephalopathy.   HSS corrected but mental status did not correct hence transferred to [facility]. Her mental status is gradually improving and returning to normal.  Suspected prolonged delirium. Acute bacterial meningitis ruled  out.  HSV negative.  Discontinued all antibiotics and antiviral except treatment for E. coli bacteremia. No focal deficits. All-time fall precautions.  Delirium precautions.  Patient on multiple mental health medications, mood stabilizers.  Resume medications on lower doses.  Acute multifocal cardioembolic stroke: Rule out infective endocarditis/infective emboli. MRI 3/16 shows acute multifocal cardioembolic stroke.  Patient with history of peripheral vascular disease, previously used to be on Eliquis, was taken off.  May have underlying cardioembolic source. Currently neurologically improving.  No focal deficits except generalized weakness. 2D echocardiogram with severe valvular heart disease but no evidence of thrombosis or infection. MRA of the head and neck with no obvious large vessel occlusion. On aspirin and statin. For TEE today.  Suspected meningo-encephalitis: Ruled out.  E. coli bacteremia: Blood cultures 3/8 , urine cultures 3/8 outside hospital with E. coli pansensitive.  Repeat cultures negative.  CT scan of the abdomen pelvis without any evidence of hydronephrosis or obstruction. Continue Rocephin for total 10 days.  Last day of therapy today.  Repeat cultures negative.  Acute kidney injury with history of chronic kidney disease stage IIIa: Baseline creatinine about 1.4.  Gradually improving.  Urine output is improving.  Levels are improving. Adequate urine output. Discontinue IV fluids.  Discontinue Foley catheter and monitor.  Hyperchloremic hypernatremia: Due to total free water deficit.  On half-normal saline.  Sodium normalizing.  Renal functions improving.  Discontinue fluids.  Type 2 diabetes with HHS: Blood sugars fairly controlled.  Hemoglobin A1c is 14.   Oral intake is still poor.  Stable on current doses of insulin.  Chronic systolic heart failure: Compensated.  Holding diuretics.  Peripheral vascular disease/peripheral arterial stents: Mother reported  that  patient is off anticoagulation now.  No evidence of active ischemia.  She may need to go back on Eliquis with cardioembolic stroke.  Will await transesophageal echocardiogram.  History of bipolar disorder/peripheral neuropathy: Patient on  amitriptyline 150 mg daily, resume 50 mg daily 3/18 Gabapentin 600 mg 3 times a day, reduce to 300 mg 3 times a day Seroquel 800 mg at night, reduce to 400 mg. Venlafaxine 150 mg daily.  Resumed.  Will increase to home dose tonight.  Continue to mobilize.  Remove PICC line, Foley catheter after procedure today.  No indication to continue cardiac telemetry after procedure.    DVT prophylaxis: heparin injection 5,000 Units Start: 01/04/21 0600   Code Status: Full code Family Communication: Mother at bedside 3/17. Disposition Plan: Status is: Inpatient  Remains inpatient appropriate because:IV treatments appropriate due to intensity of illness or inability to take PO and Inpatient level of care appropriate due to severity of illness   Dispo: The patient is from: Home              Anticipated d/c is to: SNF              Patient currently is not medically stable to d/c.   Difficult to place patient No         Consultants:   Neurology  Procedures:   Lumbar puncture  EEG  Antimicrobials:  Antibiotics Given (last 72 hours)    Date/Time Action Medication Dose Rate   01/05/21 1349 New Bag/Given   acyclovir (ZOVIRAX) 750 mg in dextrose 5 % 150 mL IVPB 750 mg 165 mL/hr   01/05/21 1559 New Bag/Given   cefTRIAXone (ROCEPHIN) 2 g in sodium chloride 0.9 % 100 mL IVPB 2 g 200 mL/hr   01/05/21 2246 New Bag/Given   acyclovir (ZOVIRAX) 750 mg in dextrose 5 % 150 mL IVPB 750 mg 165 mL/hr   01/06/21 1132 New Bag/Given   cefTRIAXone (ROCEPHIN) 2 g in sodium chloride 0.9 % 100 mL IVPB 2 g 200 mL/hr   01/07/21 1109 New Bag/Given   cefTRIAXone (ROCEPHIN) 2 g in sodium chloride 0.9 % 100 mL IVPB 2 g 200 mL/hr   01/08/21 1041 New Bag/Given    cefTRIAXone (ROCEPHIN) 2 g in sodium chloride 0.9 % 100 mL IVPB 2 g 200 mL/hr         Subjective: Patient seen and examined.  No overnight events.  She is able to communicate.  Feels weak otherwise no other complaints.  Does not have good appetite.  Denies any nausea vomiting or chest pain.  Going for procedure today.  Objective: Vitals:   01/07/21 2055 01/08/21 0024 01/08/21 0424 01/08/21 0812  BP: 125/84 119/78 98/67 126/88  Pulse: (!) 107  85 100  Resp: '20 20 15 16  '$ Temp: 97.9 F (36.6 C) 98.3 F (36.8 C) 98 F (36.7 C) 99.4 F (37.4 C)  TempSrc: Oral Oral Oral Oral  SpO2: 100% 100% 98% 97%  Weight:      Height:        Intake/Output Summary (Last 24 hours) at 01/08/2021 1054 Last data filed at 01/08/2021 0852 Gross per 24 hour  Intake 2987.64 ml  Output 2200 ml  Net 787.64 ml   Filed Weights   01/04/21 0808 01/05/21 0413 01/07/21 0208  Weight: 95.7 kg 96.9 kg 97.7 kg    Examination:  General: Frail and debilitated.  She looks older than her stated age.  Chronically sick looking.  On room air.  Not  in any distress. Cardiovascular: S1-S2 normal.  Regular rate rhythm.  No murmurs. Respiratory: Bilateral clear. Gastrointestinal: Soft and nontender.  Bowel sounds present.  Foley catheter with clear urine. Ext: 1+ edema both upper extremities. Left arm more then right arm. Neuro: Cranial nerves no deficit.  She has generalized weakness all 4 extremities but no focal deficits. 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  She has nonhealing ulcer on the left plantar aspect.  No cyanosis or edema. Catheters: Foley catheter in. PICC line: Left arm PICC line present with surrounding swelling.     Data Reviewed: I have personally reviewed following labs and imaging studies  CBC: Recent Labs  Lab 01/04/21 0313 01/05/21 0149 01/06/21 0525 01/07/21 0555 01/08/21 0500  WBC 9.7 6.3 10.6* 8.9 9.1  NEUTROABS 7.9* 4.8 8.4* 6.7 6.8  HGB 9.5* 7.8* 8.5* 8.0*  7.8*  HCT 30.9* 25.6* 26.7* 25.8* 25.2*  MCV 86.6 89.2 87.0 87.8 87.5  PLT 143* 119* 180 189 [id number]   Basic Metabolic Panel: Recent Labs  Lab 01/04/21 0313 01/05/21 0149 01/06/21 0525 01/07/21 0555 01/08/21 0500  NA 150* 150* 142 140 139  K 4.2 4.0 3.6 4.0 4.4  CL 124* 125* 116* 116* 115*  CO2 20* 21* 19* 19* 19*  GLUCOSE 209* 209* 141* 79 119*  BUN 43* 42* 37* 31* 31*  CREATININE 2.88* 2.78* 2.54* 2.36* 2.27*  CALCIUM 8.2* 7.8* 7.8* 7.8* 7.9*  MG  --  1.6*  --   --   --   PHOS  --  3.4  --   --   --    GFR: Estimated Creatinine Clearance: 38.9 mL/min (A) (by C-G formula based on SCr of 2.27 mg/dL (H)). Liver Function Tests: Recent Labs  Lab 01/04/21 0313 01/05/21 0149 01/06/21 0525 01/07/21 0555  AST 15 10* 11* 9*  ALT '11 10 10 8  '$ ALKPHOS 88 62 71 62  BILITOT 0.3 0.6 0.7 0.7  PROT 5.9* 4.9* 5.5* 5.1*  ALBUMIN 1.6* 1.3* 1.6* 1.4*   No results for input(s): LIPASE, AMYLASE in the last 168 hours. Recent Labs  Lab 01/05/21 1300  AMMONIA 20   Coagulation Profile: No results for input(s): INR, PROTIME in the last 168 hours. Cardiac Enzymes: No results for input(s): CKTOTAL, CKMB, CKMBINDEX, TROPONINI in the last 168 hours. BNP (last 3 results) No results for input(s): PROBNP in the last 8760 hours. HbA1C: Recent Labs    01/07/21 0555  HGBA1C 14.1*   CBG: Recent Labs  Lab 01/07/21 1641 01/07/21 2134 01/08/21 0011 01/08/21 0435 01/08/21 0819  GLUCAP 126* 113* 141* 109* 81   Lipid Profile: Recent Labs    01/07/21 0555  CHOL 92  HDL 24*  LDLCALC 49  TRIG 95  CHOLHDL 3.8   Thyroid Function Tests: No results for input(s): TSH, T4TOTAL, FREET4, T3FREE, THYROIDAB in the last 72 hours. Anemia Panel: No results for input(s): VITAMINB12, FOLATE, FERRITIN, TIBC, IRON, RETICCTPCT in the last 72 hours. Sepsis Labs: No results for input(s): PROCALCITON, LATICACIDVEN in the last 168 hours.  Recent Results (from the past 240 hour(s))  CSF culture w Gram  Stain     Status: None   Collection Time: 01/04/21 11:07 AM   Specimen: PATH Cytology CSF; Cerebrospinal Fluid  Result Value Ref Range Status   Specimen Description CSF  Final   Special Requests NONE  Final   Gram Stain   Final    WBC PRESENT, PREDOMINANTLY MONONUCLEAR NO ORGANISMS SEEN CYTOSPIN SMEAR  Culture   Final    NO GROWTH 3 DAYS Performed at [facility], [place] [address] [place], [place] [address]    Report Status 01/07/2021 FINAL  Final  Anaerobic culture w Gram Stain     Status: None (Preliminary result)   Collection Time: 01/04/21 11:07 AM   Specimen: PATH Cytology CSF; Cerebrospinal Fluid  Result Value Ref Range Status   Specimen Description CSF  Final   Special Requests   Final    NONE Performed at [facility], [place] [address], [place], [place] [address]    Gram Stain PENDING  Incomplete   Culture   Final    NO ANAEROBES ISOLATED; CULTURE IN PROGRESS FOR 5 DAYS   Report Status PENDING  Incomplete         Radiology Studies: MR ANGIO HEAD WO CONTRAST  Result Date: 01/07/2021 CLINICAL DATA:  Stroke follow-up. EXAM: MRA NECK WITHOUT CONTRAST MRA HEAD WITHOUT CONTRAST TECHNIQUE: Angiographic images of the neck were obtained using MRA technique without intravenous contrast; Angiographic images of the Circle of Willis were obtained using MRA technique without intravenous contrast. COMPARISON:  MRI of the brain January 06, 2021. FINDINGS: Motion degraded study. 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 The visualized subclavian arteries are normal. Right carotid system: Normal course and caliber without stenosis or evidence of dissection. Left carotid system: Normal course and caliber without stenosis or evidence of dissection. Vertebral arteries: Right dominant. Vertebral arteries are normal in course and caliber to the vertebrobasilar confluence without stenosis or evidence of dissection. MRA HEAD  FINDINGS The visualized portions of the distal cervical and intracranial internal carotid arteries are widely patent with normal flow related enhancement. The bilateral anterior cerebral arteries and middle cerebral arteries are widely patent with antegrade flow without high-grade flow-limiting stenosis or proximal branch occlusion. No intracranial aneurysm within the anterior circulation. The vertebral arteries are widely patent with antegrade flow. Vertebrobasilar junction and basilar artery are widely patent with antegrade flow without evidence of basilar stenosis or aneurysm. Posterior cerebral arteries are normal bilaterally, noting that the right posterior cerebral artery origin is directly from the right ICA (fetal PCA). No intracranial aneurysm within the posterior circulation. IMPRESSION: Normal MRA of the head and neck. Electronically Signed   By: Pedro Earls M.D.   On: 01/07/2021 12:04   MR ANGIO NECK WO CONTRAST  Result Date: 01/07/2021 CLINICAL DATA:  Stroke follow-up. EXAM: MRA NECK WITHOUT CONTRAST MRA HEAD WITHOUT CONTRAST TECHNIQUE: Angiographic images of the neck were obtained using MRA technique without intravenous contrast; Angiographic images of the Circle of Willis were obtained using MRA technique without intravenous contrast. COMPARISON:  MRI of the brain January 06, 2021. FINDINGS: Motion degraded study. 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 The visualized subclavian arteries are normal. Right carotid system: Normal course and caliber without stenosis or evidence of dissection. Left carotid system: Normal course and caliber without stenosis or evidence of dissection. Vertebral arteries: Right dominant. Vertebral arteries are normal in course and caliber to the vertebrobasilar confluence without stenosis or evidence of dissection. MRA HEAD FINDINGS The visualized portions of the distal cervical and intracranial  internal carotid arteries are widely patent with normal flow related enhancement. The bilateral anterior cerebral arteries and middle cerebral arteries are widely patent with antegrade flow without high-grade flow-limiting stenosis or proximal branch occlusion. No intracranial aneurysm within the anterior circulation.  The vertebral arteries are widely patent with antegrade flow. Vertebrobasilar junction and basilar artery are widely patent with antegrade flow without evidence of basilar stenosis or aneurysm. Posterior cerebral arteries are normal bilaterally, noting that the right posterior cerebral artery origin is directly from the right ICA (fetal PCA). No intracranial aneurysm within the posterior circulation. IMPRESSION: Normal MRA of the head and neck. Electronically Signed   By: Pedro Earls M.D.   On: 01/07/2021 12:04   ECHOCARDIOGRAM COMPLETE  Result Date: 01/06/2021    ECHOCARDIOGRAM REPORT   Patient Name:   TASHIA SHELDEN Date of Exam: 01/06/2021 Medical Rec #:  [id number]       Height:       69.0 in Accession #:    [id number]      Weight:       213.6 lb Date of Birth:  08/05/1976       BSA:          2.125 m Patient Age:    6 years        BP:           93/60 mmHg Patient Gender: F               HR:           100 bpm. Exam Location:  Inpatient Procedure: 2D Echo, 3D Echo, Cardiac Doppler, Color Doppler and Intracardiac            Opacification Agent Indications:    Barb Merino,  History:        Patient has prior history of Echocardiogram examinations, most                 recent 03/31/2020. CHF; Risk Factors:Hypertension and Diabetes.                 Acute kidney injury superimposed on CKD                 Bacteremia due to Escherichia coli                 Acute metabolic encephalopathy.  Sonographer:    Darlina Sicilian RDCS Referring Phys: [id number] [place]  1. 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 PHT is not accuate in the setting of tachycardia. MVA by VTI 0.6 cm2 consistent with severe stenosis. Severe MR also makes VTI and gradient higher than expected. There is severe mitral regurgitation with pulmonary vein flow reversal in the left upper PV in systole. A TEE is recommended for better characterization of the MV and associated pathology. The MS and MR appear simliar to prior study 03/31/2020. 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  2. Left ventricular ejection fraction, by estimation, is 45 to 50%. Left ventricular ejection fraction by 3D volume is 45 %. The left ventricle has mildly decreased function. The left ventricle demonstrates global hypokinesis. Left ventricular diastolic  function could not be evaluated.  3. Right ventricular systolic function is low normal. The right ventricular size is mildly enlarged. There is moderately elevated pulmonary artery systolic pressure. The estimated right ventricular systolic pressure is [id number] mmHg.  4. The aortic valve is tricuspid. Aortic valve regurgitation is not visualized. No aortic stenosis is present.  5. The inferior vena cava is dilated in size with <50% respiratory variability, suggesting right atrial pressure of 15 mmHg. Comparison(s): No significant change from prior study. EF ~  45%. Moderate to severe MS and severe MR remain. FINDINGS  Left Ventricle: Left ventricular ejection fraction, by estimation, is 45 to 50%. Left ventricular ejection fraction by 3D volume is 45 %. The left ventricle has mildly decreased function. The left ventricle demonstrates global hypokinesis. Definity contrast agent was given IV to delineate the left ventricular endocardial borders. The left ventricular internal cavity size was normal in size. There is no left ventricular hypertrophy. Left ventricular diastolic function could not be evaluated due to mitral stenosis. Left ventricular diastolic function could not be evaluated. Right  Ventricle: The right ventricular size is mildly enlarged. No increase in right ventricular wall thickness. Right ventricular systolic function is low normal. There is moderately elevated pulmonary artery systolic pressure. The tricuspid regurgitant  velocity is 3.03 m/s, and with an assumed right atrial pressure of 15 mmHg, the estimated right ventricular systolic pressure is [id number] mmHg. Left Atrium: Left atrial size was normal in size. Right Atrium: Right atrial size was normal in size. Pericardium: There is no evidence of pericardial effusion. 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 PHT is not accuate in the setting of tachycardia. MVA by VTI 0.6 cm2 consistent with severe stenosis. Severe MR also makes  VTI and gradient higher than expected. There is severe mitral regurgitation with pulmonary vein flow reversal in the left upper PV in systole. A TEE is recommended for better characterization of the MV and associated pathology. The MS and MR appear simliar to prior study 03/31/2020. The mitral valve is rheumatic. Severe mitral valve regurgitation, with posteriorly-directed jet. MV peak gradient, 30.8 mmHg. The mean mitral valve gradient is 9.3 mmHg with average heart rate of 99 bpm. Tricuspid Valve: The tricuspid valve is grossly normal. Tricuspid valve regurgitation is mild . No evidence of tricuspid stenosis. Aortic Valve: The aortic valve is tricuspid. Aortic valve regurgitation is not visualized. No aortic stenosis is present. Pulmonic Valve: The pulmonic valve was grossly normal. Pulmonic valve regurgitation is not visualized. No evidence of pulmonic stenosis. Aorta: The aortic root and ascending aorta are structurally normal, with no evidence of dilitation. Venous: The inferior vena cava is dilated in size with less than 50% respiratory variability, suggesting right atrial pressure of 15 mmHg. IAS/Shunts: The atrial  septum is grossly normal.  LEFT VENTRICLE PLAX 2D LVIDd:         5.40 cm         Diastology LVIDs:         4.20 cm         LV e' medial:    5.96 cm/s LV PW:         1.00 cm         LV E/e' medial:  29.3 LV IVS:        1.00 cm         LV e' lateral:   8.59 cm/s LVOT diam:     1.80 cm         LV E/e' lateral: 20.3 LV SV:         28 LV SV Index:   13 LVOT Area:     2.54 cm        3D Volume EF                                LV 3D EF:    Left  ventricular LV Volumes (MOD)                            ejection LV vol d, MOD    161.0 ml                   fraction by A2C:                                        3D volume LV vol d, MOD    122.0 ml                   is 45 %. A4C: LV vol s, MOD    92.8 ml A2C:                           3D Volume EF: LV vol s, MOD    59.9 ml       3D EF:        45 % A4C:                           LV EDV:       179 ml LV SV MOD A2C:   68.2 ml       LV ESV:       98 ml LV SV MOD A4C:   122.0 ml      LV SV:        81 ml LV SV MOD BP:    66.7 ml RIGHT VENTRICLE RV S prime:     8.76 cm/s TAPSE (M-mode): 0.8 cm LEFT ATRIUM             Index       RIGHT ATRIUM          Index LA diam:        4.60 cm 2.17 cm/m  RA Area:     9.27 cm LA Vol (A2C):   38.0 ml 17.91 ml/m RA Volume:   15.50 ml 7.30 ml/m LA Vol (A4C):   33.1 ml 15.58 ml/m LA Biplane Vol: 33.8 ml 15.91 ml/m  AORTIC VALVE LVOT Vmax:   88.60 cm/s LVOT Vmean:  57.700 cm/s LVOT VTI:    0.110 m  AORTA Ao Root diam: 2.70 cm Ao Asc diam:  2.60 cm MITRAL VALVE                 TRICUSPID VALVE MV Area (PHT): 2.93 cm      TR Peak grad:   36.7 mmHg MV Area VTI:   0.61 cm      TR Vmax:        303.00 cm/s MV Peak grad:  30.8 mmHg MV Mean grad:  9.3 mmHg      SHUNTS MV Vmax:       2.78 m/s      Systemic VTI:  0.11 m MV Vmean:      199.8 cm/s    Systemic Diam: 1.80 cm MV VTI:        0.46 m MV Decel Time: 259 msec MR Peak grad:    91.4 mmHg MR Mean grad:    58.5 mmHg MR Vmax:         478.00 cm/s MR Vmean:         364.0 cm/s  MR PISA:         3.53 cm MR PISA Eff ROA: 19 mm MR PISA Radius:  0.75 cm MV E velocity: 174.67 cm/s MV A velocity: 145.00 cm/s MV E/A ratio:  1.20 Eleonore Chiquito MD Electronically signed by Eleonore Chiquito MD Signature Date/Time: 01/06/2021/3:23:18 PM    Final         Scheduled Meds: . amitriptyline  50 mg Oral QHS  . aspirin  300 mg Rectal Daily   Or  . aspirin  325 mg Oral Daily  . Chlorhexidine Gluconate Cloth  6 each Topical Daily  . divalproex  500 mg Oral Q8H  . feeding supplement  1 Container Oral TID BM  . gabapentin  300 mg Oral TID  . heparin  5,000 Units Subcutaneous Q8H  . insulin aspart  4 Units Subcutaneous TID WC  . insulin glargine  10 Units Subcutaneous BID  . metoprolol tartrate  12.5 mg Oral BID  . multivitamin with minerals  1 tablet Oral Daily  . QUEtiapine  400 mg Oral QHS  . [START ON 01/09/2021] venlafaxine XR  150 mg Oral Daily   Continuous Infusions: . sodium chloride    . cefTRIAXone (ROCEPHIN)  IV 2 g (01/08/21 1041)     LOS: 4 days    Time spent: 30 minutes    Barb Merino, MD [facility] Pager [phone]

## 2021-01-08 NOTE — Progress Notes (Signed)
STROKE TEAM PROGRESS NOTE  HPI:  [name] is an 45 y.o. 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 She had blood sugar > 1200 and was diagnosed with HHS at that time. She was treated with IVF and insulin infusion with resolution of HHS.  However, she continued to be encephalopathic, unable to communicate or answer questions. She did have AKI superimposed on her CKD; this had been slowly improving but Cr was still not at her baseline. She developed a fever and blood cultures came back positive for E. Coli. An LP was attempted but could not be obtained secondary to patient movement. An MRI could not be obtained for the same reason. Decision was then made to transfer the patient to [facility] for EEG and Neurology consult. She was started on ceftriaxone and vancomycin to cover for possible meningitis.  Seen by Dr. Cheral Marker on 3/14. Acyclovir for possible HSV encephalitis and ampicillin for possible Listeria were added to regimen. ID consult, LP and MRI under sedation were recommended.   INTERVAL HISTORY No acute events overnight   Since the above 2D Echo indicated rheumatic heart disease for which TEE was recommended and performed. Finding indicating endocarditis were not identified.   Her mother is at the bedside.    Vitals:   01/07/21 2055 01/08/21 0024 01/08/21 0424 01/08/21 0812  BP: 125/84 119/78 98/67 126/88  Pulse: (!) 107  85 100  Resp: '20 20 15 16  '$ Temp: 97.9 F (36.6 C) 98.3 F (36.8 C) 98 F (36.7 C) 99.4 F (37.4 C)  TempSrc: Oral Oral Oral Oral  SpO2: 100% 100% 98% 97%  Weight:      Height:       CBC:  Recent Labs   Lab 01/07/21 0555 01/08/21 0500  WBC 8.9 9.1  NEUTROABS 6.7 6.8  HGB 8.0* 7.8*  HCT 25.8* 25.2*  MCV 87.8 87.5  PLT 189 [id number]   Basic Metabolic Panel:  Recent Labs  Lab 01/05/21 0149 01/06/21 0525 01/07/21 0555 01/08/21 0500  NA 150*   < > 140 139  K 4.0   < > 4.0 4.4  CL 125*   < > 116* 115*  CO2 21*   < > 19* 19*  GLUCOSE 209*   < > 79 119*  BUN 42*   < > 31* 31*  CREATININE 2.78*   < > 2.36* 2.27*  CALCIUM 7.8*   < > 7.8* 7.9*  MG 1.6*  --   --   --   PHOS 3.4  --   --   --    < > = values in this interval not displayed.   Lipid Panel:  Recent Labs  Lab 01/07/21 0555  CHOL 92  TRIG 95  HDL 24*  CHOLHDL 3.8  VLDL 19  LDLCALC 49   HgbA1c:  Recent Labs  Lab 01/07/21 0555  HGBA1C 14.1*   Urine Drug Screen: No results for input(s): LABOPIA, COCAINSCRNUR, LABBENZ, AMPHETMU, THCU, LABBARB in the last 168 hours.  Alcohol Level No results for input(s): ETH in the last 168 hours.  IMAGING past 24 hours MR ANGIO HEAD WO CONTRAST  Result Date: 01/07/2021 CLINICAL DATA:  Stroke follow-up. EXAM: MRA NECK WITHOUT  CONTRAST MRA HEAD WITHOUT CONTRAST TECHNIQUE: Angiographic images of the neck were obtained using MRA technique without intravenous contrast; Angiographic images of the Circle of Willis were obtained using MRA technique without intravenous contrast. COMPARISON:  MRI of the brain January 06, 2021. FINDINGS: Motion degraded study. 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 The visualized subclavian arteries are normal. Right carotid system: Normal course and caliber without stenosis or evidence of dissection. Left carotid system: Normal course and caliber without stenosis or evidence of dissection. Vertebral arteries: Right dominant. Vertebral arteries are normal in course and caliber to the vertebrobasilar confluence without stenosis or evidence of dissection. MRA HEAD FINDINGS The visualized portions of the distal  cervical and intracranial internal carotid arteries are widely patent with normal flow related enhancement. The bilateral anterior cerebral arteries and middle cerebral arteries are widely patent with antegrade flow without high-grade flow-limiting stenosis or proximal branch occlusion. No intracranial aneurysm within the anterior circulation. The vertebral arteries are widely patent with antegrade flow. Vertebrobasilar junction and basilar artery are widely patent with antegrade flow without evidence of basilar stenosis or aneurysm. Posterior cerebral arteries are normal bilaterally, noting that the right posterior cerebral artery origin is directly from the right ICA (fetal PCA). No intracranial aneurysm within the posterior circulation. IMPRESSION: Normal MRA of the head and neck. Electronically Signed   By: Pedro Earls M.D.   On: 01/07/2021 12:04   MR ANGIO NECK WO CONTRAST  Result Date: 01/07/2021 CLINICAL DATA:  Stroke follow-up. EXAM: MRA NECK WITHOUT CONTRAST MRA HEAD WITHOUT CONTRAST TECHNIQUE: Angiographic images of the neck were obtained using MRA technique without intravenous contrast; Angiographic images of the Circle of Willis were obtained using MRA technique without intravenous contrast. COMPARISON:  MRI of the brain January 06, 2021. FINDINGS: Motion degraded study. 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 The visualized subclavian arteries are normal. Right carotid system: Normal course and caliber without stenosis or evidence of dissection. Left carotid system: Normal course and caliber without stenosis or evidence of dissection. Vertebral arteries: Right dominant. Vertebral arteries are normal in course and caliber to the vertebrobasilar confluence without stenosis or evidence of dissection. MRA HEAD FINDINGS The visualized portions of the distal cervical and intracranial internal carotid arteries are widely patent with  normal flow related enhancement. The bilateral anterior cerebral arteries and middle cerebral arteries are widely patent with antegrade flow without high-grade flow-limiting stenosis or proximal branch occlusion. No intracranial aneurysm within the anterior circulation. The vertebral arteries are widely patent with antegrade flow. Vertebrobasilar junction and basilar artery are widely patent with antegrade flow without evidence of basilar stenosis or aneurysm. Posterior cerebral arteries are normal bilaterally, noting that the right posterior cerebral artery origin is directly from the right ICA (fetal PCA). No intracranial aneurysm within the posterior circulation. IMPRESSION: Normal MRA of the head and neck. Electronically Signed   By: Pedro Earls M.D.   On: 01/07/2021 12:04   PHYSICAL EXAM  Neuro: Mental Status:Awakens to voice. Decreased level of alertness but appears to increase slightly over the course of exam. Initially patient was not able to give her name but eventually she was able to communicate her name and that it was the third month of the year. Patient could not communicate where she was. Patient appeared to perseverate on some responses to commands and this would prevent her from accurately answering other questions or following  other commands.  Cranial Nerves: [id number] not track, has gross saccadic movements. Both pupils reactive with R appearing 4-74m and L appearing 3-4 mm.  III,IV, [id number] conjugate. Did not track to command. V,VII:Does not blink to threat. VIII: hearingintact to some questions IX,X:Symmetrical elevation of palate. [id number] from side to side at times. [id number] with no fasciculations.  Motor: Has to be assissted to hold up BUE but is able to hold them up for a few seconds before sudden loss of tone. Does not squeeze hands on command today. Patient is able to lift L leg against gravity but has loss of tone suddenly,  patient continues to try to lift her leg as she perseverates on the task. Patient was unable to lift the R leg. Sensory:Reacts to tactile stimuli in upper and lower extremities Deep Tendon Reflexes:1+bilateral brachioradialis and patellae Cerebellar:Unable to assess; does not follow commands for testing Gait:Unable to assess  ASSESSMENT/PLAN 45yo F with amultifactorial toxic/metabolic encephalopathy including renal failure, gram-negative bacteremia, hyperosmolar state initially that is improving.In addition, multifocal small punctate embolic infarcts were found on MRI. She has a history of endocarditis, and this will need to be investigated as well. OTher causes of cardioembolic phenomena are also possible. Will get MRA head and neck also.   Stroke:  Multiple territory infarcts (bilateral frontal lobes, right parietal and temporal lobe, left occipital lobe and left side of the pons) suggestive of embolic source with unknown etiology. Endocarditis suspected but ruled out by TEE.     MRA  Head and neck -no acute findings  MR Brain -Foci of restricted diffusion in the bilateral frontal lobes, right parietal and temporal lobe, left occipital lobe and left side of the pons, suggestive of embolic acute infarcts. 2. Remote infarct in the left frontal lobe.  2D Echo  -The mitral valve appears rheumatic. There is moderate to severe mitral stenosis. EF 45% TEE -Rheumatic MV with moderate MS; severe MR. EF 40-45%,left atrium severely dilated, + LV wall motion abnormalities, No thrombus or shunt  LDL 49  HgbA1c 14.1  VTE prophylaxis - heparin 5000u sq q 8h    Diet   Diet NPO time specified Except for: Sips with Meds   On ASA 81 mg PTA  Recommend DAPT with ASA '81mg'$  and Plavix 75 mg daily x 3 weeks then ASA '81mg'$  daily alone   Therapy recommendations:  TBD  Disposition:  TBD  Hypertension . Permissive hypertension (OK if < 220/120) but gradually normalize in 5-7 days . Long-term  BP goal normotensive  Hyperlipidemia  LDL 49 at goal < 70  High intensity statin when medically appropriate:Lipitor '40mg'$  which is home regimen   Continue statin at discharge  Diabetes type II Uncontrolled  Management per primary team  HgbA1c 14.1, goal < 7.0  CBGs Recent Labs    01/08/21 0011 01/08/21 0435 01/08/21 0819  GLUCAP 141* 109* 81      SSI  Close PCP follow up at discharge  Other Stroke Risk Factors  Current Cigarette smoker, 28 pack year hx, advised to stop smoking  ETOH use, alcohol level-advised to drink no more than 1-2 drink(s) a day  Ongoing polysubstance with history of cocaine abuse - UDS:  THC POSITIVE, Cocaine NONE DETECTED. Patient advised to stop using due to stroke risk.  Obesity, Body mass index is 31.81 kg/m., BMI >/= 30 associated with increased stroke risk, recommend weight loss, diet and exercise as appropriate   CHF  Cocaine related ischemic cardiomyopathy  Possible Obstructive  sleep apnea: outpatient follow up   Other [facility] day # 4 I have personally obtained history,examined this patient, reviewed notes, independently viewed imaging studies, participated in medical decision making and plan of care.ROS completed by me personally and pertinent positives fully documented  I have made any additions or clarifications directly to the above note. Agree with note above.  Patient has had a prolonged hospitalization follow-up sepsis and encephalopathy and MRI scan shows small by cerebral cortical and subcortical infarcts embolic etiology likely from central cardiac source however TEE does not show evidence of definite vegetations or intracardiac clot but does show moderate rheumatic mitral stenosis.  Even if you do prolonged cardiac monitoring and find A. fib and not showed this would qualify for 1 of anticoagulation.  Recommend aspirin and Plavix for 3 weeks followed by aspirin alone.  Patient counseled to quit smoking  cigarettes and using marijuana need for aggressive risk factor modification.  Stroke team will sign off.  Follow-up as an outpatient stroke clinic in 6 weeks.  Discussed with Dr.Ghimire.  Greater than 50% time during this 35-minute visit was spent on counseling and coordination of care about cryptogenic strokes and answering questions.  Discussed with patient and mother at the bedside.  Antony Contras, MD Medical Director [place] Pager: [phone] 01/08/2021 5:41 PM   To contact Stroke Continuity provider, please refer to [email or web address] After hours, contact General Neurology

## 2021-01-08 NOTE — Anesthesia Procedure Notes (Signed)
Procedure Name: MAC Date/Time: 01/08/2021 1:42 PM Performed by: Kathryne Hitch, CRNA Pre-anesthesia Checklist: Patient identified, Emergency Drugs available, Suction available and Patient being monitored Patient Re-evaluated:Patient Re-evaluated prior to induction Oxygen Delivery Method: Nasal cannula Preoxygenation: Pre-oxygenation with 100% oxygen Induction Type: IV induction Placement Confirmation: positive ETCO2 Dental Injury: Teeth and Oropharynx as per pre-operative assessment

## 2021-01-08 NOTE — Transfer of Care (Signed)
Immediate Anesthesia Transfer of Care Note  Patient: [name]  Procedure(s) Performed: TRANSESOPHAGEAL ECHOCARDIOGRAM (TEE) (N/A ) BUBBLE STUDY  Patient Location: Endoscopy Unit  Anesthesia Type:MAC  Level of Consciousness: drowsy and patient cooperative  Airway & Oxygen Therapy: Patient Spontanous Breathing and Patient connected to nasal cannula oxygen  Post-op Assessment: Report given to RN and Post -op Vital signs reviewed and stable  Post vital signs: Reviewed and stable  Last Vitals:  Vitals Value Taken Time  BP 83/59    Temp    Pulse 80   Resp 16   SpO2 99     Last Pain:  Vitals:   01/08/21 1303  TempSrc:   PainSc: 0-No pain      Patients Stated Pain Goal: 0 (93/23/55 [id number])  Complications: No complications documented.

## 2021-01-08 NOTE — Progress Notes (Signed)
Occupational Therapy Treatment Patient Details Name: [name] MRN: [id number] DOB: June 09, 1976 Today's Date: 01/08/2021    History of present illness Pt adm from [facility] on [id number] with metabolic encephalopathy. MRI while at [facility] showing multiple embolic strokes. PMH - DM, peripheral neuropathy, diabetic foot ulcers, lt transmet amupation, amputation of rt 4th and 5th toes, bipolar, ptsd, copd, htn, chf, seizure disorder, polysubstance abuse   OT comments  Patient supine in bed and agreeable to OT/PT session.  Patient requires increased time to process and initiate tasks, easily distracted and forgetful of questions asked.  Patient completing bed mobility with mod assist, transfers into standing using stedy with max assist +2, and LB dressing with total assist. Engaged in grooming at EOB with mod assist, demonstrating improving strength of BUEs but poor coordination and fatigues quickly.  Repositioned UEs in elevation at end of session and encouraged fist pumps due to edema.  Updated dc plan to SNF, as per notes family preference for patient to be closer to home at SNF (vs CIR, although very CIR appropriate).    Follow Up Recommendations  SNF;Supervision/Assistance - 24 hour    Equipment Recommendations  3 in 1 bedside commode;Hospital bed;Wheelchair (measurements OT);Wheelchair cushion (measurements OT)    Recommendations for Other Services Rehab consult    Precautions / Restrictions Precautions Precautions: Fall Restrictions Weight Bearing Restrictions: No       Mobility Bed Mobility Overal bed mobility: Needs Assistance Bed Mobility: Supine to Sit;Sit to Supine;Rolling Rolling: Mod assist   Supine to sit: Mod assist;HOB elevated Sit to supine: Mod assist   General bed mobility comments: assist to bring LEs off of bed and elevate trunk. Assist to return LEs into bed and lower trunk    Transfers Overall transfer level: Needs assistance Equipment used:  Ambulation equipment used Transfers: Sit to/from Stand Sit to Stand: Max assist;+2 physical assistance;+2 safety/equipment         General transfer comment: maxA+2 for power up into standing with STEDY. Sit to stand x2 from bed and x1 from STEDY pads. Patient with uncontrolled descent on first trial requiring assist to lower on subsequent trials.    Balance Overall balance assessment: Needs assistance Sitting-balance support: Bilateral upper extremity supported;Feet supported Sitting balance-Leahy Scale: Fair Sitting balance - Comments: requires min guard for safety. Sways in all directions during sitting EOB   Standing balance support: Bilateral upper extremity supported;During functional activity Standing balance-Leahy Scale: Poor Standing balance comment: reliant on UE support, knee blocking, and external assist to maintain standing                           ADL either performed or assessed with clinical judgement   ADL Overall ADL's : Needs assistance/impaired     Grooming: Wash/dry face;Brushing hair;Moderate assistance;Sitting Grooming Details (indicate cue type and reason): sitting EOB to wash face/comb hair, increased time and effort using R UE only             Lower Body Dressing: Total assistance;+2 for physical assistance;+2 for safety/equipment;Sit to/from stand Lower Body Dressing Details (indicate cue type and reason): total assist to manage socks, +2 max assist sit to stand Toilet Transfer: Total assistance;+2 for physical assistance;+2 for safety/equipment Toilet Transfer Details (indicate cue type and reason): simulated in stedy, to [facility]         Functional mobility during ADLs: Maximal assistance;+2 for physical assistance;+2 for safety/equipment General ADL Comments: patient limited by cognition, balance, weakness;  labile during session     Vision       Perception     Praxis      Cognition Arousal/Alertness: Awake/alert Behavior  During Therapy: WFL for tasks assessed/performed (labile) Overall Cognitive Status: Impaired/Different from baseline Area of Impairment: Attention;Memory;Following commands;Safety/judgement;Awareness;Problem solving;Orientation                 Orientation Level: Disoriented to;Situation Current Attention Level: Sustained Memory: Decreased short-term memory Following Commands: Follows one step commands with increased time Safety/Judgement: Decreased awareness of safety;Decreased awareness of deficits Awareness: Intellectual Problem Solving: Slow processing;Decreased initiation;Requires verbal cues;Requires tactile cues;Difficulty sequencing General Comments: patient easily distracted and requires re-direction, increased time to process and respond as well (pts mother interrupts her too). Labile and easily frustrated during session.        Exercises     Shoulder Instructions       General Comments VSS    Pertinent Vitals/ Pain       Pain Assessment: No/denies pain  Home Living                                          Prior Functioning/Environment              Frequency  Min 2X/week        Progress Toward Goals  OT Goals(current goals can now be found in the care plan section)  Progress towards OT goals: Progressing toward goals  Acute Rehab OT Goals Patient Stated Goal: to get better OT Goal Formulation: With patient/family  Plan Frequency remains appropriate;Discharge plan needs to be updated    Co-evaluation    PT/OT/SLP Co-Evaluation/Treatment: Yes Reason for Co-Treatment: Necessary to address cognition/behavior during functional activity;For patient/therapist safety;To address functional/ADL transfers   OT goals addressed during session: ADL's and self-care      AM-PAC OT "6 Clicks" Daily Activity     Outcome Measure   Help from another person eating meals?: Total Help from another person taking care of personal grooming?:  Total Help from another person toileting, which includes using toliet, bedpan, or urinal?: Total Help from another person bathing (including washing, rinsing, drying)?: Total Help from another person to put on and taking off regular upper body clothing?: Total Help from another person to put on and taking off regular lower body clothing?: Total 6 Click Score: 6    End of Session Equipment Utilized During Treatment: Gait belt  OT Visit Diagnosis: Unsteadiness on feet (R26.81);Muscle weakness (generalized) (M62.81);Other symptoms and signs involving cognitive function;Hemiplegia and hemiparesis   Activity Tolerance Patient tolerated treatment well   Patient Left in bed;with call bell/phone within reach;with bed alarm set;with family/visitor present   Nurse Communication Mobility status        Time: [id number] OT Time Calculation (min): 30 min  Charges: OT General Charges $OT Visit: 1 Visit OT Treatments $Self Care/Home Management : 8-22 mins  Jolaine Artist, OT [place] Pager [phone] Office [phone]    Delight Stare 01/08/2021, 12:59 PM

## 2021-01-08 NOTE — Anesthesia Preprocedure Evaluation (Addendum)
Anesthesia Evaluation  Patient identified by MRN, date of birth, ID band Patient awake    Reviewed: Allergy & Precautions, H&P , NPO status , Patient's Chart, lab work & pertinent test results  History of Anesthesia Complications Negative for: history of anesthetic complications  Airway Mallampati: I  TM Distance: <3 FB Neck ROM: Full    Dental  (+) Edentulous Upper, Edentulous Lower   Pulmonary shortness of breath and with exertion, COPD, Current Smoker,    Pulmonary exam normal  (-) decreased breath sounds      Cardiovascular hypertension, + Peripheral Vascular Disease and +CHF (LVEF 45-50%)  Normal cardiovascular exam  IMPRESSIONS    1. The mitral valve appears rheumatic. There is moderate to severe mitral  stenosis with mean gradient 9.3 mmHG @ 99 bpm. PHT is not accuate in the  setting of tachycardia. MVA by VTI 0.6 cm2 consistent with severe  stenosis. Severe MR also makes VTI and  gradient higher than expected. There is severe mitral regurgitation with  pulmonary vein flow reversal in the left upper PV in systole. A TEE is  recommended for better characterization of the MV and associated  pathology. The MS and MR appear simliar to  prior study 03/31/2020. The mitral valve is rheumatic. Severe mitral valve  regurgitation.  2. Left ventricular ejection fraction, by estimation, is 45 to 50%. Left  ventricular ejection fraction by 3D volume is 45 %. The left ventricle has  mildly decreased function. The left ventricle demonstrates global  hypokinesis. Left ventricular diastolic  function could not be evaluated.  3. Right ventricular systolic function is low normal. The right  ventricular size is mildly enlarged. There is moderately elevated  pulmonary artery systolic pressure. The estimated right ventricular  systolic pressure is [id number] mmHg.  4. The aortic valve is tricuspid. Aortic valve regurgitation is not   visualized. No aortic stenosis is present.  5. The inferior vena cava is dilated in size with <50% respiratory  variability, suggesting right atrial pressure of 15 mmHg.   Comparison(s): No significant change from prior study. EF ~45%. Moderate  to severe MS and severe MR remain.    Neuro/Psych Seizures -,  PSYCHIATRIC DISORDERS Anxiety Bipolar Disorder    GI/Hepatic GERD  ,(+)     substance abuse (cocaine negative yesterday)  cocaine use, marijuana use and IV drug use,   Endo/Other  diabetes  Renal/GU Renal InsufficiencyRenal disease     Musculoskeletal  (+) Arthritis , Osteoarthritis,    Abdominal   Peds  Hematology negative hematology ROS (+)   Anesthesia Other Findings   Reproductive/Obstetrics negative OB ROS                            Anesthesia Physical  Anesthesia Plan  ASA: III and emergent  Anesthesia Plan: General   Post-op Pain Management:    Induction:   PONV Risk Score and Plan: 2 and Ondansetron, Treatment may vary due to age or medical condition and Propofol infusion  Airway Management Planned: Natural Airway and Simple Face Mask  Additional Equipment:   Intra-op Plan:   Post-operative Plan:   Informed Consent: I have reviewed the patients History and Physical, chart, labs and discussed the procedure including the risks, benefits and alternatives for the proposed anesthesia with the patient or authorized representative who has indicated his/her understanding and acceptance.       Plan Discussed with: Anesthesiologist  Anesthesia Plan Comments:  Anesthesia Quick Evaluation  

## 2021-01-08 NOTE — Progress Notes (Signed)
PROGRESS NOTE    [name]  [id number] DOB: 16-Oct-1976 DOA: 01/04/2021 PCP: [facility]    Brief Narrative:  45 year old female with extensive medical problems including chronic combined heart failure, type 2 diabetes on insulin, hypertension, COPD, GERD, seizure disorder, bipolar disorder, history of substance abuse, sensory neuropathy who was admitted to [facility] since 3/8 where she presented with confusion of about 1 day.  Patient was unable to provide any history.  Her mother who lives with her able to give history that she was at her usual self 2 days prior to admission.  At the outside hospital, she was found to be in HHS with blood sugars more than 1200.  Treated with insulin drip then subsequently changed to subcu insulin with no improvement of mentation.  She started spiking fever, blood culture reportedly with E. coli, urine culture not reported.  Treated with ceftriaxone.  With persistent encephalopathy despite treatment she was transferred to [facility] with neurology consultation. Mental status improving.  MRI did show multiple embolic stroke.   Assessment & Plan:   Principal Problem:   Metabolic encephalopathy Active Problems:   Bipolar disorder, unspecified ([place])   DM type 2 (diabetes mellitus, type 2)- poorly controlled   Ulcer of great toe, left, with fat layer exposed ([place])   Essential hypertension   Chronic systolic CHF (congestive heart failure) (HCC)   PVD (peripheral vascular disease) ([place])   Acute kidney injury superimposed on CKD ([place])   Bacteremia due to Escherichia coli   Acute metabolic encephalopathy   Cerebral thrombosis with cerebral infarction  Acute metabolic encephalopathy: Multifactorial encephalopathy.   HSS corrected but mental status did not correct hence transferred to [facility]. Her mental status is gradually improving and returning to normal.  Suspected prolonged delirium. Acute bacterial meningitis ruled  out.  HSV negative.  Discontinued all antibiotics and antiviral except treatment for E. coli bacteremia. No focal deficits. All-time fall precautions.  Delirium precautions.  Patient on multiple mental health medications, mood stabilizers.  Resume medications on lower doses.  Acute multifocal cardioembolic stroke: Rule out infective endocarditis/infective emboli. MRI 3/16 shows acute multifocal cardioembolic stroke.  Patient with history of peripheral vascular disease, previously used to be on Eliquis, was taken off.  May have underlying cardioembolic source. Currently neurologically improving.  No focal deficits except generalized weakness. 2D echocardiogram with severe valvular heart disease but no evidence of thrombosis or infection. MRA of the head and neck with no obvious large vessel occlusion. On aspirin and statin. For TEE today.  Suspected meningo-encephalitis: Ruled out.  E. coli bacteremia: Blood cultures 3/8 , urine cultures 3/8 outside hospital with E. coli pansensitive.  Repeat cultures negative.  CT scan of the abdomen pelvis without any evidence of hydronephrosis or obstruction. Continue Rocephin for total 10 days.  Last day of therapy today.  Repeat cultures negative.  Acute kidney injury with history of chronic kidney disease stage IIIa: Baseline creatinine about 1.4.  Gradually improving.  Urine output is improving.  Levels are improving. Adequate urine output. Discontinue IV fluids.  Discontinue Foley catheter and monitor.  Hyperchloremic hypernatremia: Due to total free water deficit.  On half-normal saline.  Sodium normalizing.  Renal functions improving.  Discontinue fluids.  Type 2 diabetes with HHS: Blood sugars fairly controlled.  Hemoglobin A1c is 14.   Oral intake is still poor.  Stable on current doses of insulin.  Chronic systolic heart failure: Compensated.  Holding diuretics.  Peripheral vascular disease/peripheral arterial stents: Mother reported  that  patient is off anticoagulation now.  No evidence of active ischemia.  She may need to go back on Eliquis with cardioembolic stroke.  Will await transesophageal echocardiogram.  History of bipolar disorder/peripheral neuropathy: Patient on  amitriptyline 150 mg daily, resume 50 mg daily 3/18 Gabapentin 600 mg 3 times a day, reduce to 300 mg 3 times a day Seroquel 800 mg at night, reduce to 400 mg. Venlafaxine 150 mg daily.  Resumed.  Will increase to home dose tonight.  Continue to mobilize.  Remove PICC line, Foley catheter after procedure today.  No indication to continue cardiac telemetry after procedure.    DVT prophylaxis: heparin injection 5,000 Units Start: 01/04/21 0600   Code Status: Full code Family Communication: Mother at bedside 3/17. Disposition Plan: Status is: Inpatient  Remains inpatient appropriate because:IV treatments appropriate due to intensity of illness or inability to take PO and Inpatient level of care appropriate due to severity of illness   Dispo: The patient is from: Home              Anticipated d/c is to: SNF              Patient currently is not medically stable to d/c.   Difficult to place patient No         Consultants:   Neurology  Procedures:   Lumbar puncture  EEG  Antimicrobials:  Antibiotics Given (last 72 hours)    Date/Time Action Medication Dose Rate   01/05/21 1349 New Bag/Given   acyclovir (ZOVIRAX) 750 mg in dextrose 5 % 150 mL IVPB 750 mg 165 mL/hr   01/05/21 1559 New Bag/Given   cefTRIAXone (ROCEPHIN) 2 g in sodium chloride 0.9 % 100 mL IVPB 2 g 200 mL/hr   01/05/21 2246 New Bag/Given   acyclovir (ZOVIRAX) 750 mg in dextrose 5 % 150 mL IVPB 750 mg 165 mL/hr   01/06/21 1132 New Bag/Given   cefTRIAXone (ROCEPHIN) 2 g in sodium chloride 0.9 % 100 mL IVPB 2 g 200 mL/hr   01/07/21 1109 New Bag/Given   cefTRIAXone (ROCEPHIN) 2 g in sodium chloride 0.9 % 100 mL IVPB 2 g 200 mL/hr   01/08/21 1041 New Bag/Given    cefTRIAXone (ROCEPHIN) 2 g in sodium chloride 0.9 % 100 mL IVPB 2 g 200 mL/hr         Subjective: Patient seen and examined.  No overnight events.  She is able to communicate.  Feels weak otherwise no other complaints.  Does not have good appetite.  Denies any nausea vomiting or chest pain.  Going for procedure today.  Objective: Vitals:   01/07/21 2055 01/08/21 0024 01/08/21 0424 01/08/21 0812  BP: 125/84 119/78 98/67 126/88  Pulse: (!) 107  85 100  Resp: '20 20 15 16  '$ Temp: 97.9 F (36.6 C) 98.3 F (36.8 C) 98 F (36.7 C) 99.4 F (37.4 C)  TempSrc: Oral Oral Oral Oral  SpO2: 100% 100% 98% 97%  Weight:      Height:        Intake/Output Summary (Last 24 hours) at 01/08/2021 1054 Last data filed at 01/08/2021 0852 Gross per 24 hour  Intake 2987.64 ml  Output 2200 ml  Net 787.64 ml   Filed Weights   01/04/21 0808 01/05/21 0413 01/07/21 0208  Weight: 95.7 kg 96.9 kg 97.7 kg    Examination:  General: Frail and debilitated.  She looks older than her stated age.  Chronically sick looking.  On room air.  Not  in any distress. Cardiovascular: S1-S2 normal.  Regular rate rhythm.  No murmurs. Respiratory: Bilateral clear. Gastrointestinal: Soft and nontender.  Bowel sounds present.  Foley catheter with clear urine. Ext: 1+ edema both upper extremities. Left arm more then right arm. Neuro: Cranial nerves no deficit.  She has generalized weakness all 4 extremities but no focal deficits. 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  She has nonhealing ulcer on the left plantar aspect.  No cyanosis or edema. Catheters: Foley catheter in. PICC line: Left arm PICC line present with surrounding swelling.     Data Reviewed: I have personally reviewed following labs and imaging studies  CBC: Recent Labs  Lab 01/04/21 0313 01/05/21 0149 01/06/21 0525 01/07/21 0555 01/08/21 0500  WBC 9.7 6.3 10.6* 8.9 9.1  NEUTROABS 7.9* 4.8 8.4* 6.7 6.8  HGB 9.5* 7.8* 8.5* 8.0*  7.8*  HCT 30.9* 25.6* 26.7* 25.8* 25.2*  MCV 86.6 89.2 87.0 87.8 87.5  PLT 143* 119* 180 189 [id number]   Basic Metabolic Panel: Recent Labs  Lab 01/04/21 0313 01/05/21 0149 01/06/21 0525 01/07/21 0555 01/08/21 0500  NA 150* 150* 142 140 139  K 4.2 4.0 3.6 4.0 4.4  CL 124* 125* 116* 116* 115*  CO2 20* 21* 19* 19* 19*  GLUCOSE 209* 209* 141* 79 119*  BUN 43* 42* 37* 31* 31*  CREATININE 2.88* 2.78* 2.54* 2.36* 2.27*  CALCIUM 8.2* 7.8* 7.8* 7.8* 7.9*  MG  --  1.6*  --   --   --   PHOS  --  3.4  --   --   --    GFR: Estimated Creatinine Clearance: 38.9 mL/min (A) (by C-G formula based on SCr of 2.27 mg/dL (H)). Liver Function Tests: Recent Labs  Lab 01/04/21 0313 01/05/21 0149 01/06/21 0525 01/07/21 0555  AST 15 10* 11* 9*  ALT '11 10 10 8  '$ ALKPHOS 88 62 71 62  BILITOT 0.3 0.6 0.7 0.7  PROT 5.9* 4.9* 5.5* 5.1*  ALBUMIN 1.6* 1.3* 1.6* 1.4*   No results for input(s): LIPASE, AMYLASE in the last 168 hours. Recent Labs  Lab 01/05/21 1300  AMMONIA 20   Coagulation Profile: No results for input(s): INR, PROTIME in the last 168 hours. Cardiac Enzymes: No results for input(s): CKTOTAL, CKMB, CKMBINDEX, TROPONINI in the last 168 hours. BNP (last 3 results) No results for input(s): PROBNP in the last 8760 hours. HbA1C: Recent Labs    01/07/21 0555  HGBA1C 14.1*   CBG: Recent Labs  Lab 01/07/21 1641 01/07/21 2134 01/08/21 0011 01/08/21 0435 01/08/21 0819  GLUCAP 126* 113* 141* 109* 81   Lipid Profile: Recent Labs    01/07/21 0555  CHOL 92  HDL 24*  LDLCALC 49  TRIG 95  CHOLHDL 3.8   Thyroid Function Tests: No results for input(s): TSH, T4TOTAL, FREET4, T3FREE, THYROIDAB in the last 72 hours. Anemia Panel: No results for input(s): VITAMINB12, FOLATE, FERRITIN, TIBC, IRON, RETICCTPCT in the last 72 hours. Sepsis Labs: No results for input(s): PROCALCITON, LATICACIDVEN in the last 168 hours.  Recent Results (from the past 240 hour(s))  CSF culture w Gram  Stain     Status: None   Collection Time: 01/04/21 11:07 AM   Specimen: PATH Cytology CSF; Cerebrospinal Fluid  Result Value Ref Range Status   Specimen Description CSF  Final   Special Requests NONE  Final   Gram Stain   Final    WBC PRESENT, PREDOMINANTLY MONONUCLEAR NO ORGANISMS SEEN CYTOSPIN SMEAR  Culture   Final    NO GROWTH 3 DAYS Performed at [facility], [place] [address] [place], [place] [address]    Report Status 01/07/2021 FINAL  Final  Anaerobic culture w Gram Stain     Status: None (Preliminary result)   Collection Time: 01/04/21 11:07 AM   Specimen: PATH Cytology CSF; Cerebrospinal Fluid  Result Value Ref Range Status   Specimen Description CSF  Final   Special Requests   Final    NONE Performed at [facility], [place] [address] [place], [place] [address]    Gram Stain PENDING  Incomplete   Culture   Final    NO ANAEROBES ISOLATED; CULTURE IN PROGRESS FOR 5 DAYS   Report Status PENDING  Incomplete         Radiology Studies: MR ANGIO HEAD WO CONTRAST  Result Date: 01/07/2021 CLINICAL DATA:  Stroke follow-up. EXAM: MRA NECK WITHOUT CONTRAST MRA HEAD WITHOUT CONTRAST TECHNIQUE: Angiographic images of the neck were obtained using MRA technique without intravenous contrast; Angiographic images of the Circle of Willis were obtained using MRA technique without intravenous contrast. COMPARISON:  MRI of the brain January 06, 2021. FINDINGS: Motion degraded study. 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 The visualized subclavian arteries are normal. Right carotid system: Normal course and caliber without stenosis or evidence of dissection. Left carotid system: Normal course and caliber without stenosis or evidence of dissection. Vertebral arteries: Right dominant. Vertebral arteries are normal in course and caliber to the vertebrobasilar confluence without stenosis or evidence of dissection. MRA HEAD  FINDINGS The visualized portions of the distal cervical and intracranial internal carotid arteries are widely patent with normal flow related enhancement. The bilateral anterior cerebral arteries and middle cerebral arteries are widely patent with antegrade flow without high-grade flow-limiting stenosis or proximal branch occlusion. No intracranial aneurysm within the anterior circulation. The vertebral arteries are widely patent with antegrade flow. Vertebrobasilar junction and basilar artery are widely patent with antegrade flow without evidence of basilar stenosis or aneurysm. Posterior cerebral arteries are normal bilaterally, noting that the right posterior cerebral artery origin is directly from the right ICA (fetal PCA). No intracranial aneurysm within the posterior circulation. IMPRESSION: Normal MRA of the head and neck. Electronically Signed   By: Pedro Earls M.D.   On: 01/07/2021 12:04   MR ANGIO NECK WO CONTRAST  Result Date: 01/07/2021 CLINICAL DATA:  Stroke follow-up. EXAM: MRA NECK WITHOUT CONTRAST MRA HEAD WITHOUT CONTRAST TECHNIQUE: Angiographic images of the neck were obtained using MRA technique without intravenous contrast; Angiographic images of the Circle of Willis were obtained using MRA technique without intravenous contrast. COMPARISON:  MRI of the brain January 06, 2021. FINDINGS: Motion degraded study. 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 The visualized subclavian arteries are normal. Right carotid system: Normal course and caliber without stenosis or evidence of dissection. Left carotid system: Normal course and caliber without stenosis or evidence of dissection. Vertebral arteries: Right dominant. Vertebral arteries are normal in course and caliber to the vertebrobasilar confluence without stenosis or evidence of dissection. MRA HEAD FINDINGS The visualized portions of the distal cervical and intracranial  internal carotid arteries are widely patent with normal flow related enhancement. The bilateral anterior cerebral arteries and middle cerebral arteries are widely patent with antegrade flow without high-grade flow-limiting stenosis or proximal branch occlusion. No intracranial aneurysm within the anterior circulation.  The vertebral arteries are widely patent with antegrade flow. Vertebrobasilar junction and basilar artery are widely patent with antegrade flow without evidence of basilar stenosis or aneurysm. Posterior cerebral arteries are normal bilaterally, noting that the right posterior cerebral artery origin is directly from the right ICA (fetal PCA). No intracranial aneurysm within the posterior circulation. IMPRESSION: Normal MRA of the head and neck. Electronically Signed   By: Pedro Earls M.D.   On: 01/07/2021 12:04   ECHOCARDIOGRAM COMPLETE  Result Date: 01/06/2021    ECHOCARDIOGRAM REPORT   Patient Name:   [name] Date of Exam: 01/06/2021 Medical Rec #:  [id number]       Height:       69.0 in Accession #:    [id number]      Weight:       213.6 lb Date of Birth:  08-17-1976       BSA:          2.125 m Patient Age:    65 years        BP:           93/60 mmHg Patient Gender: F               HR:           100 bpm. Exam Location:  Inpatient Procedure: 2D Echo, 3D Echo, Cardiac Doppler, Color Doppler and Intracardiac            Opacification Agent Indications:    Barb Merino,  History:        Patient has prior history of Echocardiogram examinations, most                 recent 03/31/2020. CHF; Risk Factors:Hypertension and Diabetes.                 Acute kidney injury superimposed on CKD                 Bacteremia due to Escherichia coli                 Acute metabolic encephalopathy.  Sonographer:    Darlina Sicilian RDCS Referring Phys: [id number] [place]  1. 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 PHT is not accuate in the setting of tachycardia. MVA by VTI 0.6 cm2 consistent with severe stenosis. Severe MR also makes VTI and gradient higher than expected. There is severe mitral regurgitation with pulmonary vein flow reversal in the left upper PV in systole. A TEE is recommended for better characterization of the MV and associated pathology. The MS and MR appear simliar to prior study 03/31/2020. 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  2. Left ventricular ejection fraction, by estimation, is 45 to 50%. Left ventricular ejection fraction by 3D volume is 45 %. The left ventricle has mildly decreased function. The left ventricle demonstrates global hypokinesis. Left ventricular diastolic  function could not be evaluated.  3. Right ventricular systolic function is low normal. The right ventricular size is mildly enlarged. There is moderately elevated pulmonary artery systolic pressure. The estimated right ventricular systolic pressure is [id number] mmHg.  4. The aortic valve is tricuspid. Aortic valve regurgitation is not visualized. No aortic stenosis is present.  5. The inferior vena cava is dilated in size with <50% respiratory variability, suggesting right atrial pressure of 15 mmHg. Comparison(s): No significant change from prior study. EF ~  45%. Moderate to severe MS and severe MR remain. FINDINGS  Left Ventricle: Left ventricular ejection fraction, by estimation, is 45 to 50%. Left ventricular ejection fraction by 3D volume is 45 %. The left ventricle has mildly decreased function. The left ventricle demonstrates global hypokinesis. Definity contrast agent was given IV to delineate the left ventricular endocardial borders. The left ventricular internal cavity size was normal in size. There is no left ventricular hypertrophy. Left ventricular diastolic function could not be evaluated due to mitral stenosis. Left ventricular diastolic function could not be evaluated. Right  Ventricle: The right ventricular size is mildly enlarged. No increase in right ventricular wall thickness. Right ventricular systolic function is low normal. There is moderately elevated pulmonary artery systolic pressure. The tricuspid regurgitant  velocity is 3.03 m/s, and with an assumed right atrial pressure of 15 mmHg, the estimated right ventricular systolic pressure is [id number] mmHg. Left Atrium: Left atrial size was normal in size. Right Atrium: Right atrial size was normal in size. Pericardium: There is no evidence of pericardial effusion. 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 PHT is not accuate in the setting of tachycardia. MVA by VTI 0.6 cm2 consistent with severe stenosis. Severe MR also makes  VTI and gradient higher than expected. There is severe mitral regurgitation with pulmonary vein flow reversal in the left upper PV in systole. A TEE is recommended for better characterization of the MV and associated pathology. The MS and MR appear simliar to prior study 03/31/2020. The mitral valve is rheumatic. Severe mitral valve regurgitation, with posteriorly-directed jet. MV peak gradient, 30.8 mmHg. The mean mitral valve gradient is 9.3 mmHg with average heart rate of 99 bpm. Tricuspid Valve: The tricuspid valve is grossly normal. Tricuspid valve regurgitation is mild . No evidence of tricuspid stenosis. Aortic Valve: The aortic valve is tricuspid. Aortic valve regurgitation is not visualized. No aortic stenosis is present. Pulmonic Valve: The pulmonic valve was grossly normal. Pulmonic valve regurgitation is not visualized. No evidence of pulmonic stenosis. Aorta: The aortic root and ascending aorta are structurally normal, with no evidence of dilitation. Venous: The inferior vena cava is dilated in size with less than 50% respiratory variability, suggesting right atrial pressure of 15 mmHg. IAS/Shunts: The atrial  septum is grossly normal.  LEFT VENTRICLE PLAX 2D LVIDd:         5.40 cm         Diastology LVIDs:         4.20 cm         LV e' medial:    5.96 cm/s LV PW:         1.00 cm         LV E/e' medial:  29.3 LV IVS:        1.00 cm         LV e' lateral:   8.59 cm/s LVOT diam:     1.80 cm         LV E/e' lateral: 20.3 LV SV:         28 LV SV Index:   13 LVOT Area:     2.54 cm        3D Volume EF                                LV 3D EF:    Left  ventricular LV Volumes (MOD)                            ejection LV vol d, MOD    161.0 ml                   fraction by A2C:                                        3D volume LV vol d, MOD    122.0 ml                   is 45 %. A4C: LV vol s, MOD    92.8 ml A2C:                           3D Volume EF: LV vol s, MOD    59.9 ml       3D EF:        45 % A4C:                           LV EDV:       179 ml LV SV MOD A2C:   68.2 ml       LV ESV:       98 ml LV SV MOD A4C:   122.0 ml      LV SV:        81 ml LV SV MOD BP:    66.7 ml RIGHT VENTRICLE RV S prime:     8.76 cm/s TAPSE (M-mode): 0.8 cm LEFT ATRIUM             Index       RIGHT ATRIUM          Index LA diam:        4.60 cm 2.17 cm/m  RA Area:     9.27 cm LA Vol (A2C):   38.0 ml 17.91 ml/m RA Volume:   15.50 ml 7.30 ml/m LA Vol (A4C):   33.1 ml 15.58 ml/m LA Biplane Vol: 33.8 ml 15.91 ml/m  AORTIC VALVE LVOT Vmax:   88.60 cm/s LVOT Vmean:  57.700 cm/s LVOT VTI:    0.110 m  AORTA Ao Root diam: 2.70 cm Ao Asc diam:  2.60 cm MITRAL VALVE                 TRICUSPID VALVE MV Area (PHT): 2.93 cm      TR Peak grad:   36.7 mmHg MV Area VTI:   0.61 cm      TR Vmax:        303.00 cm/s MV Peak grad:  30.8 mmHg MV Mean grad:  9.3 mmHg      SHUNTS MV Vmax:       2.78 m/s      Systemic VTI:  0.11 m MV Vmean:      199.8 cm/s    Systemic Diam: 1.80 cm MV VTI:        0.46 m MV Decel Time: 259 msec MR Peak grad:    91.4 mmHg MR Mean grad:    58.5 mmHg MR Vmax:         478.00 cm/s MR Vmean:         364.0 cm/s  MR PISA:         3.53 cm MR PISA Eff ROA: 19 mm MR PISA Radius:  0.75 cm MV E velocity: 174.67 cm/s MV A velocity: 145.00 cm/s MV E/A ratio:  1.20 Eleonore Chiquito MD Electronically signed by Eleonore Chiquito MD Signature Date/Time: 01/06/2021/3:23:18 PM    Final         Scheduled Meds: . amitriptyline  50 mg Oral QHS  . aspirin  300 mg Rectal Daily   Or  . aspirin  325 mg Oral Daily  . Chlorhexidine Gluconate Cloth  6 each Topical Daily  . divalproex  500 mg Oral Q8H  . feeding supplement  1 Container Oral TID BM  . gabapentin  300 mg Oral TID  . heparin  5,000 Units Subcutaneous Q8H  . insulin aspart  4 Units Subcutaneous TID WC  . insulin glargine  10 Units Subcutaneous BID  . metoprolol tartrate  12.5 mg Oral BID  . multivitamin with minerals  1 tablet Oral Daily  . QUEtiapine  400 mg Oral QHS  . [START ON 01/09/2021] venlafaxine XR  150 mg Oral Daily   Continuous Infusions: . sodium chloride    . cefTRIAXone (ROCEPHIN)  IV 2 g (01/08/21 1041)     LOS: 4 days    Time spent: 30 minutes    Barb Merino, MD [facility] Pager [phone]

## 2021-01-08 NOTE — Progress Notes (Signed)
SLP Cancellation Note  Patient Details Name: [name] MRN: [id number] DOB: September 18, 1976   Cancelled treatment:       Reason Eval/Treat Not Completed: Patient at procedure or test/unavailable. Pt NPO for TEE today. Will f/u next week   Analese Sovine, Katherene Ponto 01/08/2021, 8:55 AM

## 2021-01-08 NOTE — Progress Notes (Signed)
  Speech Language Pathology Treatment: Dysphagia  Patient Details Name: CLARESSA COVILLE MRN: [id number] DOB: Mar 04, 1976 Today's Date: 01/08/2021 Time: [id number] SLP Time Calculation (min) (ACUTE ONLY): 17 min  Assessment / Plan / Recommendation Clinical Impression  SLP was contacted by Ty, RN who indicated that the pt/family would like the pt have to be on a regular texture diet to allow the pt have more options from outside the hospital. Pt was seen for dysphagia treatment. She was alert and cooperative during the session. She consumed a whole packet of graham crackers and thin liquids via straw, using individual and consecutive swallows. Mastication was functionally increased due to edentulous status, but oral clearance was adequate and she did not demonstrate symptoms of pharyngeal dysphagia. Pt's diet will be advanced to regular texture solids and thin liquids will be continued. SLP will continue to follow pt.    HPI HPI: 45 year old female with extensive medical problems including chronic gastric heart failure, type 2 diabetes on insulin, hypertension, COPD, GERD, seizure disorder, bipolar disorder, history of substance abuse, sensory neuropathy who was admitted to [facility], then transferred to [facility] with neurology consultation. Dx metabolic encephalopathy, E. coli bacteremia, hypernatremia, AKI. 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      SLP Plan  Continue with current plan of care       Recommendations  Diet recommendations: Regular;Thin liquid Liquids provided via: Cup;Straw Medication Administration: Crushed with puree (per RN pt with difficulty whole in puree) Supervision: Staff to assist with self feeding;Full supervision/cueing for compensatory  strategies Compensations: Slow rate;Small sips/bites;Minimize environmental distractions;Follow solids with liquid Postural Changes and/or Swallow Maneuvers: Seated upright 90 degrees;Upright 30-60 min after meal                Oral Care Recommendations: Oral care BID Follow up Recommendations: Skilled Nursing facility;Inpatient Rehab SLP Visit Diagnosis: Dysphagia, oral phase (R13.11) Plan: Continue with current plan of care       Lakesa Coste I. Hardin Negus, [place], [name] Office number [phone] Pager [place] 01/08/2021, 3:32 PM

## 2021-01-08 NOTE — Progress Notes (Signed)
    Transesophageal Echocardiogram Note  KATELAN KIPPER [id number] 05-05-1976  Procedure: Transesophageal Echocardiogram Indications: MR; bacteremia  Procedure Details Consent: Obtained Time Out: Verified patient identification, verified procedure, site/side was marked, verified correct patient position, special equipment/implants available, Radiology Safety Procedures followed,  medications/allergies/relevent history reviewed, required imaging and test results available.  Performed  Medications:  Pt sedated by anesthesia with diprovan 225 mg IV total.  Anterior hypokinesis with overall EF 40-45; mild LAE; no LAA thrombus; rheumatic MV with moderate MS (mean gradient 8 mmHg; MVA by pressure half time 1.65 cm 2); mild TR; no vegetations.    Complications: No apparent complications Patient did tolerate procedure well.  Kirk Ruths, MD

## 2021-01-08 NOTE — Progress Notes (Signed)
VAST consulted to remove PICC and place PIV d/t arm swelling.  Bilateral arms are swollen and are not appropriate for PIV placement. Left arm is swollen greater than right. Requested unit RN contact physician and request order for US doppler to assess for DVT. Advised per best practice PICC should be left in place and DVT treated if present, instead of dcing PICC. Unit RN verbalized understanding.

## 2021-01-08 NOTE — Interval H&P Note (Signed)
History and Physical Interval Note:  01/08/2021 1:22 PM  [name]  has presented today for surgery, with the diagnosis of BACTEREMIA.  The various methods of treatment have been discussed with the patient and family. After consideration of risks, benefits and other options for treatment, the patient has consented to  Procedure(s): TRANSESOPHAGEAL ECHOCARDIOGRAM (TEE) (N/A) as a surgical intervention.  The patient's history has been reviewed, patient examined, no change in status, stable for surgery.  I have reviewed the patient's chart and labs.  Questions were answered to the patient's satisfaction.     Kirk Ruths

## 2021-01-08 NOTE — TOC Progression Note (Signed)
Transition of Care [facility]) - Progression Note    Patient Details  Name: STEPHANIEMARIE STOFFEL MRN: [id number] Date of Birth: [birth year]  Transition of Care [facility]) CM/SW [place], [place] Phone Number: 01/08/2021, 11:25 AM  Clinical Narrative:   CSW spoke with Admissions at [facility] to ask them to review referral, as they were still pending. [facility] reviewed and offered a bed for patient when ready. CSW met with patient and mother at bedside to discuss bed offer and they are in agreement with [facility] when stable. Patient's PASRR is still pending. Pamala Hurry called from [facility] asking to complete interview, but patient is only oriented x 2 today, unable to participate in interview process. Pamala Hurry would like to see if patient's orientation improves for her to participate in interview, will call again tomorrow. CSW to follow.    Expected Discharge Plan: Skilled Nursing Facility Barriers to Discharge: Continued Medical Work up,Awaiting State Approval (PASRR)  Expected Discharge Plan and Services Expected Discharge Plan: [place] Choice: [place] arrangements for the past 2 months: Single Family Home                                       Social Determinants of Health (SDOH) Interventions    Readmission Risk Interventions No flowsheet data found.

## 2021-01-08 NOTE — Plan of Care (Signed)
  Problem: Education: Goal: Expressions of having a comfortable level of knowledge regarding the disease process will increase Outcome: Progressing   Problem: Coping: Goal: Ability to adjust to condition or change in health will improve Outcome: Progressing Goal: Ability to identify appropriate support needs will improve Outcome: Progressing   Problem: Health Behavior/Discharge Planning: Goal: Compliance with prescribed medication regimen will improve Outcome: Progressing   Problem: Medication: Goal: Risk for medication side effects will decrease Outcome: Progressing   Problem: Clinical Measurements: Goal: Complications related to the disease process, condition or treatment will be avoided or minimized Outcome: Progressing Goal: Diagnostic test results will improve Outcome: Progressing   Problem: Safety: Goal: Verbalization of understanding the information provided will improve Outcome: Progressing   Problem: Self-Concept: Goal: Level of anxiety will decrease Outcome: Progressing Goal: Ability to verbalize feelings about condition will improve Outcome: Progressing   Problem: Education: Goal: Knowledge of General Education information will improve Description: Including pain rating scale, medication(s)/side effects and non-pharmacologic comfort measures Outcome: Progressing   Problem: Health Behavior/Discharge Planning: Goal: Ability to manage health-related needs will improve Outcome: Progressing   Problem: Clinical Measurements: Goal: Ability to maintain clinical measurements within normal limits will improve Outcome: Progressing Goal: Will remain free from infection Outcome: Progressing Goal: Diagnostic test results will improve Outcome: Progressing Goal: Respiratory complications will improve Outcome: Progressing Goal: Cardiovascular complication will be avoided Outcome: Progressing   Problem: Activity: Goal: Risk for activity intolerance will  decrease Outcome: Progressing   Problem: Nutrition: Goal: Adequate nutrition will be maintained Outcome: Progressing   Problem: Coping: Goal: Level of anxiety will decrease Outcome: Progressing   Problem: Elimination: Goal: Will not experience complications related to bowel motility Outcome: Progressing Goal: Will not experience complications related to urinary retention Outcome: Progressing   Problem: Pain Managment: Goal: General experience of comfort will improve Outcome: Progressing   Problem: Safety: Goal: Ability to remain free from injury will improve Outcome: Progressing   Problem: Skin Integrity: Goal: Risk for impaired skin integrity will decrease Outcome: Progressing   Problem: Education: Goal: Knowledge of disease or condition will improve Outcome: Progressing Goal: Knowledge of secondary prevention will improve Outcome: Progressing Goal: Knowledge of patient specific risk factors addressed and post discharge goals established will improve Outcome: Progressing Goal: Individualized Educational Video(s) Outcome: Progressing   Problem: Coping: Goal: Will verbalize positive feelings about self Outcome: Progressing Goal: Will identify appropriate support needs Outcome: Progressing   Problem: Health Behavior/Discharge Planning: Goal: Ability to manage health-related needs will improve Outcome: Progressing   Problem: Self-Care: Goal: Ability to participate in self-care as condition permits will improve Outcome: Progressing Goal: Verbalization of feelings and concerns over difficulty with self-care will improve Outcome: Progressing Goal: Ability to communicate needs accurately will improve Outcome: Progressing   Problem: Nutrition: Goal: Risk of aspiration will decrease Outcome: Progressing Goal: Dietary intake will improve Outcome: Progressing   Problem: Ischemic Stroke/TIA Tissue Perfusion: Goal: Complications of ischemic stroke/TIA will be  minimized Outcome: Progressing

## 2021-01-08 NOTE — Progress Notes (Signed)
  Echocardiogram Echocardiogram Transesophageal has been performed.  [name] 01/08/2021, 2:22 PM

## 2021-01-08 NOTE — Progress Notes (Signed)
Physical Therapy Treatment Patient Details Name: [name] MRN: [id number] DOB: Aug 14, 1976 Today's Date: 01/08/2021    History of Present Illness Pt adm from [facility] on [id number] with metabolic encephalopathy. MRI while at [facility] showing multiple embolic strokes. 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 Patient A&Ox3, disoriented to situation. Patient requires modA for bed mobility. Patient performed sit to stand x 2 from bed to STEDY with maxA+2 and x1 from STEDY pads with min-modA+2. Patient requires assist for controlled descent. Patient continues to be limited by generalized weakness, impaired balance, and decreased activity tolerance. PT recommending CIR for increased therapy, family requesting SNF to be closer to home.   Follow Up Recommendations  CIR;SNF     Equipment Recommendations  None recommended by PT    Recommendations for Other Services       Precautions / Restrictions Precautions Precautions: Fall Restrictions Weight Bearing Restrictions: No    Mobility  Bed Mobility Overal bed mobility: Needs Assistance Bed Mobility: Supine to Sit;Sit to Supine;Rolling Rolling: Mod assist   Supine to sit: Mod assist;HOB elevated Sit to supine: Mod assist   General bed mobility comments: assist to bring LEs off of bed and elevate trunk. Assist to return LEs into bed and lower trunk    Transfers Overall transfer level: Needs assistance Equipment used: Ambulation equipment used Transfers: Sit to/from Stand Sit to Stand: Max assist;+2 physical assistance;+2 safety/equipment         General transfer comment: maxA+2 for power up into standing with STEDY. Sit to stand x2 from bed and x1 from STEDY pads. Patient with uncontrolled descent  on first trial requiring assist to lower on subsequent trials.  Ambulation/Gait                 Stairs             Wheelchair Mobility    Modified Rankin (Stroke Patients Only) Modified Rankin (Stroke Patients Only) Pre-Morbid Rankin Score: No significant disability Modified Rankin: Severe disability     Balance Overall balance assessment: Needs assistance Sitting-balance support: Bilateral upper extremity supported;Feet supported Sitting balance-Leahy Scale: Fair Sitting balance - Comments: requires min guard for safety. Sways in all directions during sitting EOB   Standing balance support: Bilateral upper extremity supported;During functional activity Standing balance-Leahy Scale: Poor Standing balance comment: reliant on UE support, knee blocking, and external assist to maintain standing                            Cognition Arousal/Alertness: Awake/alert Behavior During Therapy: WFL for tasks assessed/performed Overall Cognitive Status: Impaired/Different from baseline Area of Impairment: Attention;Memory;Following commands;Safety/judgement;Awareness;Problem solving;Orientation                 Orientation Level: Disoriented to;Situation Current Attention Level: Sustained Memory: Decreased short-term memory Following Commands: Follows one step commands with increased time Safety/Judgement: Decreased awareness of safety;Decreased awareness of deficits Awareness: Intellectual Problem Solving: Slow processing;Decreased initiation;Requires verbal cues;Requires tactile cues;Difficulty sequencing        Exercises      General Comments General comments (skin integrity, edema, etc.): VSS      Pertinent Vitals/Pain Pain Assessment: No/denies pain    Home Living  Prior Function            PT Goals (current goals can now be found in the care plan section) Acute Rehab PT Goals Patient Stated Goal: to get  better PT Goal Formulation: With patient/family Time For Goal Achievement: 01/19/21 Potential to Achieve Goals: Good Progress towards PT goals: Progressing toward goals    Frequency    Min 3X/week      PT Plan Current plan remains appropriate    Co-evaluation              AM-PAC PT "6 Clicks" Mobility   Outcome Measure  Help needed turning from your back to your side while in a flat bed without using bedrails?: A Lot Help needed moving from lying on your back to sitting on the side of a flat bed without using bedrails?: A Lot Help needed moving to and from a bed to a chair (including a wheelchair)?: A Lot Help needed standing up from a chair using your arms (e.g., wheelchair or bedside chair)?: A Lot Help needed to walk in hospital room?: Total Help needed climbing 3-5 steps with a railing? : Total 6 Click Score: 10    End of Session Equipment Utilized During Treatment: Gait belt Activity Tolerance: Patient tolerated treatment well Patient left: in bed;with call bell/phone within reach;with bed alarm set;with family/visitor present Nurse Communication: Mobility status PT Visit Diagnosis: Other abnormalities of gait and mobility (R26.89);Muscle weakness (generalized) (M62.81)     Time: [id number] PT Time Calculation (min) (ACUTE ONLY): 31 min  Charges:  $Therapeutic Activity: 8-22 mins                     Sallyann Kinnaird A. Gilford Rile PT, DPT Acute Rehabilitation Services Pager [phone] Office [phone]    Linna Hoff 01/08/2021, 12:50 PM

## 2021-01-08 NOTE — Anesthesia Postprocedure Evaluation (Signed)
Anesthesia Post Note  Patient: [name]  Procedure(s) Performed: TRANSESOPHAGEAL ECHOCARDIOGRAM (TEE) (N/A ) BUBBLE STUDY     Patient location during evaluation: PACU Anesthesia Type: General Level of consciousness: awake and alert Pain management: pain level controlled Vital Signs Assessment: post-procedure vital signs reviewed and stable Respiratory status: spontaneous breathing and respiratory function stable Cardiovascular status: stable Postop Assessment: no apparent nausea or vomiting Anesthetic complications: no   No complications documented.  Last Vitals:  Vitals:   01/08/21 1420 01/08/21 1427  BP: (!) 94/49 111/67  Pulse: 80 87  Resp: 16 16  Temp:    SpO2: 100% 100%    Last Pain:  Vitals:   01/08/21 1427  TempSrc:   PainSc: 0-No pain                 SINGER,JAMES [name]

## 2021-01-08 NOTE — TOC Progression Note (Signed)
Transition of Care [facility]) - Progression Note    Patient Details  Name: [name] MRN: [id number] Date of Birth: 1975/11/02  Transition of Care [facility]) CM/SW [place], [place] Phone Number: 01/08/2021, 11:27 AM  Clinical Narrative:   Pamala Hurry with PASRR called again today, but patient's orientation has not improved. Will try again on Monday and sign off on PASRR if mentation has not improved by that time. CSW to follow.    Expected Discharge Plan: Skilled Nursing Facility Barriers to Discharge: Continued Medical Work up,Awaiting State Approval (PASRR)  Expected Discharge Plan and Services Expected Discharge Plan: [place] Choice: [place] arrangements for the past 2 months: Single Family Home                                       Social Determinants of Health (SDOH) Interventions    Readmission Risk Interventions No flowsheet data found.

## 2021-01-09 ENCOUNTER — Other Ambulatory Visit: Payer: Self-pay

## 2021-01-09 ENCOUNTER — Inpatient Hospital Stay (HOSPITAL_COMMUNITY): Payer: Medicaid Other

## 2021-01-09 DIAGNOSIS — R609 Edema, unspecified: Secondary | ICD-10-CM | POA: Diagnosis not present

## 2021-01-09 DIAGNOSIS — B9620 Unspecified Escherichia coli [E. coli] as the cause of diseases classified elsewhere: Secondary | ICD-10-CM | POA: Diagnosis not present

## 2021-01-09 LAB — ANAEROBIC CULTURE W GRAM STAIN

## 2021-01-09 LAB — CBC WITH DIFFERENTIAL/PLATELET
Abs Immature Granulocytes: 0.16 10*3/uL — ABNORMAL HIGH (ref 0.00–0.07)
Basophils Absolute: 0 10*3/uL (ref 0.0–0.1)
Basophils Relative: 0 %
Eosinophils Absolute: 0.1 10*3/uL (ref 0.0–0.5)
Eosinophils Relative: 1 %
HCT: 28.6 % — ABNORMAL LOW (ref 36.0–46.0)
Hemoglobin: 9 g/dL — ABNORMAL LOW (ref 12.0–15.0)
Immature Granulocytes: 1 %
Lymphocytes Relative: 7 %
Lymphs Abs: 0.9 10*3/uL (ref 0.7–4.0)
MCH: 27.2 pg (ref 26.0–34.0)
MCHC: 31.5 g/dL (ref 30.0–36.0)
MCV: 86.4 fL (ref 80.0–100.0)
Monocytes Absolute: 0.7 10*3/uL (ref 0.1–1.0)
Monocytes Relative: 6 %
Neutro Abs: 10.1 10*3/uL — ABNORMAL HIGH (ref 1.7–7.7)
Neutrophils Relative %: 85 %
Platelets: 340 10*3/uL (ref 150–400)
RBC: 3.31 MIL/uL — ABNORMAL LOW (ref 3.87–5.11)
RDW: 17 % — ABNORMAL HIGH (ref 11.5–15.5)
WBC: 11.9 10*3/uL — ABNORMAL HIGH (ref 4.0–10.5)
nRBC: 0 % (ref 0.0–0.2)

## 2021-01-09 LAB — GLUCOSE, CAPILLARY
Glucose-Capillary: 101 mg/dL — ABNORMAL HIGH (ref 70–99)
Glucose-Capillary: 72 mg/dL (ref 70–99)
Glucose-Capillary: 57 mg/dL — ABNORMAL LOW (ref 70–99)
Glucose-Capillary: 94 mg/dL (ref 70–99)
Glucose-Capillary: 88 mg/dL (ref 70–99)

## 2021-01-09 LAB — BASIC METABOLIC PANEL
Anion gap: 4 — ABNORMAL LOW (ref 5–15)
BUN: 28 mg/dL — ABNORMAL HIGH (ref 6–20)
CO2: 20 mmol/L — ABNORMAL LOW (ref 22–32)
Calcium: 8.5 mg/dL — ABNORMAL LOW (ref 8.9–10.3)
Chloride: 117 mmol/L — ABNORMAL HIGH (ref 98–111)
Creatinine, Ser: 2.23 mg/dL — ABNORMAL HIGH (ref 0.44–1.00)
GFR, Estimated: 27 mL/min — ABNORMAL LOW (ref >60)
Glucose, Bld: 54 mg/dL — ABNORMAL LOW (ref 70–99)
Potassium: 4 mmol/L (ref 3.5–5.1)
Sodium: 141 mmol/L (ref 135–145)

## 2021-01-09 LAB — MAGNESIUM: Magnesium: 1.8 mg/dL (ref 1.7–2.4)

## 2021-01-09 LAB — PHOSPHORUS: Phosphorus: 4 mg/dL (ref 2.5–4.6)

## 2021-01-09 MED ORDER — QUETIAPINE FUMARATE 100 MG PO TABS
100.0000 mg | ORAL_TABLET | Freq: Every day | ORAL | Status: DC
  Administered 2021-01-09: 100 mg via ORAL
  Filled 2021-01-09 (×2): qty 1

## 2021-01-09 MED ORDER — GABAPENTIN 100 MG PO CAPS
100.0000 mg | ORAL_CAPSULE | Freq: Three times a day (TID) | ORAL | Status: DC
  Administered 2021-01-09 – 2021-01-12 (×8): 100 mg via ORAL
  Filled 2021-01-09 (×9): qty 1

## 2021-01-09 MED ORDER — VENLAFAXINE HCL ER 75 MG PO CP24
75.0000 mg | ORAL_CAPSULE | Freq: Every day | ORAL | Status: DC
  Administered 2021-01-10 – 2021-01-12 (×3): 75 mg via ORAL
  Filled 2021-01-09 (×3): qty 1

## 2021-01-09 NOTE — Progress Notes (Signed)
PROGRESS NOTE    [name]  [id number] DOB: Dec 30, 1975 DOA: 01/04/2021 PCP: [facility]    Brief Narrative:  45 year old female with extensive medical problems including chronic combined heart failure, type 2 diabetes on insulin, hypertension, COPD, GERD, seizure disorder, bipolar disorder, history of substance abuse, sensory neuropathy who was admitted to [facility] since 3/8 where she presented with confusion of about 1 day.  Patient was unable to provide any history.  Her mother who lives with her able to give history that she was at her usual self 2 days prior to admission.  At the outside hospital, she was found to be in HHS with blood sugars more than 1200.  Treated with insulin drip then subsequently changed to subcu insulin with no improvement of mentation.  She started spiking fever, blood culture reportedly with E. coli, urine culture not reported.  Treated with ceftriaxone.  With persistent encephalopathy despite treatment she was transferred to [facility] with neurology consultation. Mental status improving.  MRI did show multiple embolic stroke.   Assessment & Plan:   Principal Problem:   Metabolic encephalopathy Active Problems:   Bipolar disorder, unspecified ([place])   DM type 2 (diabetes mellitus, type 2)- poorly controlled   Ulcer of great toe, left, with fat layer exposed ([place])   Essential hypertension   Chronic systolic CHF (congestive heart failure) (HCC)   PVD (peripheral vascular disease) ([place])   Acute kidney injury superimposed on CKD ([place])   Bacteremia due to Escherichia coli   Acute metabolic encephalopathy   Cerebral thrombosis with cerebral infarction  Acute metabolic encephalopathy: Multifactorial encephalopathy.   HSS corrected but mental status did not correct hence transferred to [facility]. Her mental status is gradually improving and returning to normal.  Suspected prolonged delirium. Acute bacterial meningitis ruled  out.  HSV negative. All-time fall precautions.  Delirium precautions.  Patient on multiple mental health medications, mood stabilizers.   Resume some medications, excessively sleepy.  Acute multifocal cardioembolic stroke: Rule out infective endocarditis/infective emboli. MRI 3/16 shows acute multifocal cardioembolic stroke.  Patient with history of peripheral vascular disease, previously used to be on Eliquis, was taken off.  May have underlying cardioembolic source. Currently neurologically improving.  No focal deficits except generalized weakness. 2D echocardiogram with severe valvular heart disease but no evidence of thrombosis or infection. MRA of the head and neck with no obvious large vessel occlusion. On aspirin and statin. TEE with no evidence of thromboembolism.  No evidence of bacterial vegetation. Neurology recommended aspirin Plavix for 3 weeks then aspirin alone.  Suspected meningo-encephalitis: Ruled out.  E. coli bacteremia: Blood cultures 3/8 , urine cultures 3/8 outside hospital with E. coli pansensitive.  Repeat cultures negative.  CT scan of the abdomen pelvis without any evidence of hydronephrosis or obstruction. Continue Rocephin for total 10 days.  Completed antibiotic therapy.  Repeat blood cultures negative.  Acute kidney injury with history of chronic kidney disease stage IIIa: Baseline creatinine about 1.4.  Gradually improving.  Urine output is improving.  Levels are improving. Adequate urine output. IV fluid discontinued.  Retaining urine because of bedbound status.  Straight cath for more than 500 mL.  Hyperchloremic hypernatremia: Improving.  Type 2 diabetes with HHS: Blood sugars fairly controlled.  Hemoglobin A1c is 14.   Oral intake is still poor.  Stable on current doses of insulin.  Chronic systolic heart failure: Compensated.  Holding diuretics.  Peripheral vascular disease/peripheral arterial stents: Mother reported that patient is off  anticoagulation  now.  No evidence of active ischemia.  She may need to go back on Eliquis with cardioembolic stroke.  Will await transesophageal echocardiogram.  History of bipolar disorder/peripheral neuropathy: Patient on  amitriptyline 150 mg daily, resumed 50 mg daily at night 3/18, excessively sleepy.  Discontinue. Gabapentin 600 mg 3 times a day, reduce to 100 mg 3 times a day. Seroquel 800 mg at night, reduce to 400 mg.  Further reduce 200 mg at night tonight. Venlafaxine 150 mg daily.  Resume.  Will decrease dose to half due to excessive sleepiness.  Continue to mobilize.  Start ambulating with therapies.  Referred to SNF. Straight cath if needed. Remove PICC line because of superficial thrombosis and swelling of the hands.  Negative for DVT.    DVT prophylaxis: heparin injection 5,000 Units Start: 01/04/21 0600   Code Status: Full code Family Communication: Mother at bedside  Disposition Plan: Status is: Inpatient  Remains inpatient appropriate because:IV treatments appropriate due to intensity of illness or inability to take PO and Inpatient level of care appropriate due to severity of illness   Dispo: The patient is from: Home              Anticipated d/c is to: SNF              Patient currently is not medically stable to d/c.   Difficult to place patient No         Consultants:   Neurology  Procedures:   Lumbar puncture  EEG  Antimicrobials:  Antibiotics Given (last 72 hours)    Date/Time Action Medication Dose Rate   01/07/21 1109 New Bag/Given   cefTRIAXone (ROCEPHIN) 2 g in sodium chloride 0.9 % 100 mL IVPB 2 g 200 mL/hr   01/08/21 1041 New Bag/Given   cefTRIAXone (ROCEPHIN) 2 g in sodium chloride 0.9 % 100 mL IVPB 2 g 200 mL/hr         Subjective: Patient seen and examined.  Mother was at the bedside.  Patient will wake up and have conversation but looks sleepy.  Mother is bringing some food for her to eat.  Blood sugar  70.  Objective: Vitals:   01/09/21 0400 01/09/21 0739 01/09/21 0740 01/09/21 1251  BP: 106/69 112/69 112/69 (!) 118/93  Pulse: 91 80 80 95  Resp: '16 13 13 16  '$ Temp: 97.7 F (36.5 C) (!) 97 F (36.1 C) (!) 97 F (36.1 C) (!) 97.4 F (36.3 C)  TempSrc: Axillary Axillary Axillary Axillary  SpO2: 100% 100% 100% 100%  Weight: 102.8 kg     Height:        Intake/Output Summary (Last 24 hours) at 01/09/2021 1406 Last data filed at 01/09/2021 1057 Gross per 24 hour  Intake 1549 ml  Output 1450 ml  Net 99 ml   Filed Weights   01/05/21 0413 01/07/21 0208 01/09/21 0400  Weight: 96.9 kg 97.7 kg 102.8 kg    Examination:  General: Frail and debilitated.  She looks older than her stated age.  Chronically sick looking.  On room air.  Not in any distress. Cardiovascular: S1-S2 normal.  Regular rate rhythm.  No murmurs. Respiratory: Bilateral clear. Gastrointestinal: Soft and nontender.  Bowel sounds present.   Ext: 1+ edema both upper extremities. Left arm more then right arm. Neuro: Cranial nerves no deficit.  She has generalized weakness all 4 extremities but no focal deficits. 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  She has nonhealing ulcer on the left plantar  aspect.  No cyanosis.    Data Reviewed: I have personally reviewed following labs and imaging studies  CBC: Recent Labs  Lab 01/05/21 0149 01/06/21 0525 01/07/21 0555 01/08/21 0500 01/09/21 0600  WBC 6.3 10.6* 8.9 9.1 11.9*  NEUTROABS 4.8 8.4* 6.7 6.8 10.1*  HGB 7.8* 8.5* 8.0* 7.8* 9.0*  HCT 25.6* 26.7* 25.8* 25.2* 28.6*  MCV 89.2 87.0 87.8 87.5 86.4  PLT 119* 180 189 232 [id number]   Basic Metabolic Panel: Recent Labs  Lab 01/05/21 0149 01/06/21 0525 01/07/21 0555 01/08/21 0500 01/09/21 0600  NA 150* 142 140 139 141  K 4.0 3.6 4.0 4.4 4.0  CL 125* 116* 116* 115* 117*  CO2 21* 19* 19* 19* 20*  GLUCOSE 209* 141* 79 119* 54*  BUN 42* 37* 31* 31* 28*  CREATININE 2.78* 2.54* 2.36* 2.27* 2.23*   CALCIUM 7.8* 7.8* 7.8* 7.9* 8.5*  MG 1.6*  --   --   --  1.8  PHOS 3.4  --   --   --  4.0   GFR: Estimated Creatinine Clearance: 40.6 mL/min (A) (by C-G formula based on SCr of 2.23 mg/dL (H)). Liver Function Tests: Recent Labs  Lab 01/04/21 0313 01/05/21 0149 01/06/21 0525 01/07/21 0555  AST 15 10* 11* 9*  ALT '11 10 10 8  '$ ALKPHOS 88 62 71 62  BILITOT 0.3 0.6 0.7 0.7  PROT 5.9* 4.9* 5.5* 5.1*  ALBUMIN 1.6* 1.3* 1.6* 1.4*   No results for input(s): LIPASE, AMYLASE in the last 168 hours. Recent Labs  Lab 01/05/21 1300  AMMONIA 20   Coagulation Profile: No results for input(s): INR, PROTIME in the last 168 hours. Cardiac Enzymes: No results for input(s): CKTOTAL, CKMB, CKMBINDEX, TROPONINI in the last 168 hours. BNP (last 3 results) No results for input(s): PROBNP in the last 8760 hours. HbA1C: Recent Labs    01/07/21 0555  HGBA1C 14.1*   CBG: Recent Labs  Lab 01/08/21 1128 01/08/21 1604 01/08/21 2242 01/09/21 0621 01/09/21 1303  GLUCAP 136* 98 203* 101* 72   Lipid Profile: Recent Labs    01/07/21 0555  CHOL 92  HDL 24*  LDLCALC 49  TRIG 95  CHOLHDL 3.8   Thyroid Function Tests: No results for input(s): TSH, T4TOTAL, FREET4, T3FREE, THYROIDAB in the last 72 hours. Anemia Panel: No results for input(s): VITAMINB12, FOLATE, FERRITIN, TIBC, IRON, RETICCTPCT in the last 72 hours. Sepsis Labs: No results for input(s): PROCALCITON, LATICACIDVEN in the last 168 hours.  Recent Results (from the past 240 hour(s))  CSF culture w Gram Stain     Status: None   Collection Time: 01/04/21 11:07 AM   Specimen: PATH Cytology CSF; Cerebrospinal Fluid  Result Value Ref Range Status   Specimen Description CSF  Final   Special Requests NONE  Final   Gram Stain   Final    WBC PRESENT, PREDOMINANTLY MONONUCLEAR NO ORGANISMS SEEN CYTOSPIN SMEAR    Culture   Final    NO GROWTH 3 DAYS Performed at [facility], [address] [place], [place] [address]     Report Status 01/07/2021 FINAL  Final  Anaerobic culture w Gram Stain     Status: None   Collection Time: 01/04/21 11:07 AM   Specimen: PATH Cytology CSF; Cerebrospinal Fluid  Result Value Ref Range Status   Specimen Description CSF  Final   Special Requests NONE  Final   Culture   Final    NO ANAEROBES ISOLATED Performed at [facility],  [address], [place], [place] [address]    Report Status 01/09/2021 FINAL  Final         Radiology Studies: ECHO TEE  Result Date: 01/08/2021    TRANSESOPHOGEAL ECHO REPORT   Patient Name:   MEDINA KENNEMORE Date of Exam: 01/08/2021 Medical Rec #:  [id number]       Height:       69.0 in Accession #:    [id number]      Weight:       215.4 lb Date of Birth:  December 26, 1975       BSA:          2.132 m Patient Age:    83 years        BP:           98/67 mmHg Patient Gender: F               HR:           100 bpm. Exam Location:  Inpatient Procedure: Transesophageal Echo, 3D Echo, Cardiac Doppler, Color Doppler and            Saline Contrast Bubble Study Indications:     I34.2 Nonrheumatic mitral (valve) stenosis  History:         Patient has prior history of Echocardiogram examinations, most                  recent 01/06/2021. Stroke and COPD, Mitral Valve Disease and                  Mitral Stenosis; Risk Factors:Diabetes, Hypertension and                  Current Smoker. Mitral stenosis. Heroin overdose. Cocaine use.  Sonographer:     Roseanna Rainbow RDCS Referring Phys:  [id number] Tami Lin [facility] Diagnosing Phys: Kirk Ruths MD PROCEDURE: After discussion of the risks and benefits of a TEE, an informed consent was obtained from the patient. The transesophogeal probe was passed without difficulty through the esophogus of the patient. Imaged were obtained with the patient in a left lateral decubitus position. Sedation performed by different physician. The patient was monitored while under deep sedation. Anesthestetic sedation was provided intravenously by  Anesthesiology: '140mg'$  of Propofol. The patient's vital signs; including heart rate, blood pressure, and oxygen saturation; remained stable throughout the procedure. The patient developed no complications during the procedure. IMPRESSIONS  1. Rheumatic MV with moderate MS (mean gradient 8 mmHg; MVA 1.65 cm2 by pressure halftime); severe MR.  2. Left ventricular ejection fraction, by estimation, is 40 to 45%. The left ventricle has mildly decreased function. The left ventricle demonstrates regional wall motion abnormalities (see scoring diagram/findings for description).  3. Right ventricular systolic function is normal. The right ventricular size is normal.  4. Left atrial size was severely dilated. No left atrial/left atrial appendage thrombus was detected.  5. 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 Moderate mitral stenosis.  6. The aortic valve is normal in structure. Aortic valve regurgitation is not visualized. FINDINGS  Left Ventricle: Left ventricular ejection fraction, by estimation, is 40 to 45%. The left ventricle has mildly decreased function. The left ventricle demonstrates regional wall motion abnormalities. The left ventricular internal cavity size was normal in size. Right Ventricle: The right ventricular size is normal. . Right ventricular systolic function is normal. Left Atrium: Left atrial size was severely dilated. Spontaneous echo contrast was present in the left atrium and left  atrial appendage. No left atrial/left atrial appendage thrombus was detected. Right Atrium: Right atrial size was normal in size. Pericardium: There is no evidence of pericardial effusion. 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 Moderate mitral valve stenosis. MV peak gradient, 13.9 mmHg. The mean mitral valve gradient is 8.0 mmHg. Tricuspid Valve: The tricuspid valve is normal in structure. Tricuspid valve regurgitation is mild. Aortic Valve: The aortic  valve is normal in structure. Aortic valve regurgitation is not visualized. Pulmonic Valve: The pulmonic valve was normal in structure. Pulmonic valve regurgitation is trivial. Aorta: The aortic root is normal in size and structure. There is minimal (Grade I) plaque involving the descending aorta. IAS/Shunts: No atrial level shunt detected by color flow Doppler. Agitated saline contrast was given intravenously to evaluate for intracardiac shunting. Additional Comments: Rheumatic MV with moderate MS (mean gradient 8 mmHg; MVA 1.65 cm2 by pressure halftime); severe MR.  MITRAL VALVE MV Area (PHT): 2.59 cm MV Peak grad:  13.9 mmHg MV Mean grad:  8.0 mmHg MV Vmax:       1.86 m/s MV Vmean:      132.3 cm/s MR Peak grad:    96.8 mmHg MR Mean grad:    64.0 mmHg MR Vmax:         492.00 cm/s MR Vmean:        365.0 cm/s MR PISA:         3.08 cm MR PISA Eff ROA: 41 mm MR PISA Radius:  0.70 cm Kirk Ruths MD Electronically signed by Kirk Ruths MD Signature Date/Time: 01/08/2021/4:10:01 PM    Final    VAS [place] UPPER EXTREMITY VENOUS DUPLEX  Result Date: 01/09/2021 UPPER VENOUS STUDY  Indications: Edema, and PICC line left arm Limitations: Patient positioning, bandages and line. Comparison Study: No prior study on file Performing Technologist: Sharion Dove RVS  Examination Guidelines: A complete evaluation includes B-mode imaging, spectral Doppler, color Doppler, and power Doppler as needed of all accessible portions of each vessel. Bilateral testing is considered an integral part of a complete examination. Limited examinations for reoccurring indications may be performed as noted.  Right Findings: +----------+------------+---------+-----------+----------+--------------+ RIGHT     CompressiblePhasicitySpontaneousProperties   Summary     +----------+------------+---------+-----------+----------+--------------+ IJV                                                 Not visualized  +----------+------------+---------+-----------+----------+--------------+ Subclavian    Full       Yes       Yes                             +----------+------------+---------+-----------+----------+--------------+ Axillary      Full       Yes       Yes                             +----------+------------+---------+-----------+----------+--------------+ Brachial      Full       Yes       Yes                             +----------+------------+---------+-----------+----------+--------------+ Radial        Full                                                 +----------+------------+---------+-----------+----------+--------------+  Ulnar                                               Not visualized +----------+------------+---------+-----------+----------+--------------+ Cephalic      Full                                                 +----------+------------+---------+-----------+----------+--------------+ Basilic       Full                                                 +----------+------------+---------+-----------+----------+--------------+  Left Findings: +----------+------------+---------+-----------+----------+-------+ LEFT      CompressiblePhasicitySpontaneousPropertiesSummary +----------+------------+---------+-----------+----------+-------+ IJV           Full       Yes       Yes                      +----------+------------+---------+-----------+----------+-------+ Subclavian               Yes       Yes                      +----------+------------+---------+-----------+----------+-------+ Axillary                 Yes       Yes               PICC   +----------+------------+---------+-----------+----------+-------+ Brachial      Full       Yes       Yes                      +----------+------------+---------+-----------+----------+-------+ Cephalic      None                                           +----------+------------+---------+-----------+----------+-------+ Basilic       None                                          +----------+------------+---------+-----------+----------+-------+  Summary:  Right: No evidence of deep vein thrombosis in the upper extremity. No evidence of superficial vein thrombosis in the upper extremity.  Left: No evidence of deep vein thrombosis in the upper extremity. Findings consistent with acute superficial vein thrombosis involving the left cephalic vein and left basilic vein.  *See table(s) above for measurements and observations.    Preliminary         Scheduled Meds: . aspirin  300 mg Rectal Daily   Or  . aspirin  325 mg Oral Daily  . Chlorhexidine Gluconate Cloth  6 each Topical Daily  . feeding supplement  1 Container Oral TID BM  . gabapentin  100 mg Oral TID  . heparin  5,000 Units Subcutaneous Q8H  . insulin aspart  4 Units Subcutaneous TID WC  . insulin glargine  10 Units Subcutaneous  BID  . metoprolol tartrate  12.5 mg Oral BID  . multivitamin with minerals  1 tablet Oral Daily  . QUEtiapine  100 mg Oral QHS  . [START ON 01/10/2021] venlafaxine XR  75 mg Oral Daily   Continuous Infusions:    LOS: 5 days    Time spent: 30 minutes    Barb Merino, MD [facility] Pager [phone]

## 2021-01-09 NOTE — Progress Notes (Incomplete)
VASCULAR LAB    Bilateral upper extremity venous duplex has been performed.  See CV proc for preliminary results.  Messaged results to Dr. Sloan Leiter via secure chat.  KANADY, CANDACE, RVT 01/09/2021, 9:53 AM

## 2021-01-09 NOTE — Progress Notes (Signed)
Spoke with RN Haylie concerning PICC line removal. At this time the patient has swelling to bilateral arms and IV team is unable to place a PIV with ultrasound at this time. MD was made aware by [facility] RN and the MD is okay with the patient not having any IV access at this time and would still like the PICC line removed.

## 2021-01-09 NOTE — Plan of Care (Signed)
  Problem: Coping: Goal: Ability to adjust to condition or change in health will improve Outcome: Progressing   Problem: Health Behavior/Discharge Planning: Goal: Compliance with prescribed medication regimen will improve Outcome: Progressing   Problem: Education: Goal: Expressions of having a comfortable level of knowledge regarding the disease process will increase Outcome: Progressing

## 2021-01-09 NOTE — Progress Notes (Signed)
Pt slightly drowsy this am during I/o catheterizations follows command though but fall back to sleep   Endorsed to oncoming RN to monitor pt  Closing.

## 2021-01-09 NOTE — Progress Notes (Signed)
Bladder scan done noted 999 as pt did not void post foley removal. Pt in/out cath noted 1200  MD made aware , will continue to monitor and I/o cath  Q 6 hrs or per order set.

## 2021-01-09 NOTE — Progress Notes (Signed)
Bladder scan showed 675m, however patient refused I&O cath. Patient states "It'll come out in a little bit". Attempted education, patient continues to refuse.

## 2021-01-09 NOTE — Progress Notes (Signed)
CBG 54, Patient given 250 ml of apple juice, will recheck CBG in 15 minutes. Patient also reporting on-going auditory and visual hallucinations. Dr. Sloan Leiter aware.

## 2021-01-10 ENCOUNTER — Encounter (HOSPITAL_COMMUNITY): Payer: Self-pay | Admitting: Cardiology

## 2021-01-10 DIAGNOSIS — B9620 Unspecified Escherichia coli [E. coli] as the cause of diseases classified elsewhere: Secondary | ICD-10-CM | POA: Diagnosis not present

## 2021-01-10 LAB — CBC WITH DIFFERENTIAL/PLATELET
Abs Immature Granulocytes: 0.11 10*3/uL — ABNORMAL HIGH (ref 0.00–0.07)
Basophils Absolute: 0 10*3/uL (ref 0.0–0.1)
Basophils Relative: 0 %
Eosinophils Absolute: 0.1 10*3/uL (ref 0.0–0.5)
Eosinophils Relative: 1 %
HCT: 30 % — ABNORMAL LOW (ref 36.0–46.0)
Hemoglobin: 9.3 g/dL — ABNORMAL LOW (ref 12.0–15.0)
Immature Granulocytes: 1 %
Lymphocytes Relative: 8 %
Lymphs Abs: 0.7 10*3/uL (ref 0.7–4.0)
MCH: 27 pg (ref 26.0–34.0)
MCHC: 31 g/dL (ref 30.0–36.0)
MCV: 87.2 fL (ref 80.0–100.0)
Monocytes Absolute: 0.7 10*3/uL (ref 0.1–1.0)
Monocytes Relative: 7 %
Neutro Abs: 7.3 10*3/uL (ref 1.7–7.7)
Neutrophils Relative %: 83 %
Platelets: 424 10*3/uL — ABNORMAL HIGH (ref 150–400)
RBC: 3.44 MIL/uL — ABNORMAL LOW (ref 3.87–5.11)
RDW: 17.1 % — ABNORMAL HIGH (ref 11.5–15.5)
WBC: 8.9 10*3/uL (ref 4.0–10.5)
nRBC: 0 % (ref 0.0–0.2)

## 2021-01-10 LAB — GLUCOSE, CAPILLARY
Glucose-Capillary: 169 mg/dL — ABNORMAL HIGH (ref 70–99)
Glucose-Capillary: 221 mg/dL — ABNORMAL HIGH (ref 70–99)
Glucose-Capillary: 156 mg/dL — ABNORMAL HIGH (ref 70–99)
Glucose-Capillary: 155 mg/dL — ABNORMAL HIGH (ref 70–99)
Glucose-Capillary: 119 mg/dL — ABNORMAL HIGH (ref 70–99)

## 2021-01-10 LAB — BASIC METABOLIC PANEL
Anion gap: 9 (ref 5–15)
BUN: 27 mg/dL — ABNORMAL HIGH (ref 6–20)
CO2: 21 mmol/L — ABNORMAL LOW (ref 22–32)
Calcium: 8.6 mg/dL — ABNORMAL LOW (ref 8.9–10.3)
Chloride: 107 mmol/L (ref 98–111)
Creatinine, Ser: 2.2 mg/dL — ABNORMAL HIGH (ref 0.44–1.00)
GFR, Estimated: 27 mL/min — ABNORMAL LOW (ref >60)
Glucose, Bld: 202 mg/dL — ABNORMAL HIGH (ref 70–99)
Potassium: 4.5 mmol/L (ref 3.5–5.1)
Sodium: 137 mmol/L (ref 135–145)

## 2021-01-10 MED ORDER — ONDANSETRON 4 MG PO TBDP
4.0000 mg | ORAL_TABLET | Freq: Three times a day (TID) | ORAL | Status: DC | PRN
  Administered 2021-01-10: 4 mg via ORAL
  Filled 2021-01-10: qty 1

## 2021-01-10 MED ORDER — ONDANSETRON HCL 4 MG/2ML IJ SOLN
4.0000 mg | Freq: Once | INTRAMUSCULAR | Status: AC
  Administered 2021-01-11: 4 mg via INTRAVENOUS
  Filled 2021-01-10: qty 2

## 2021-01-10 NOTE — Progress Notes (Signed)
PROGRESS NOTE    [name]  [id number] DOB: June 21, 1976 DOA: 01/04/2021 PCP: [facility]    Brief Narrative:  45 year old female with extensive medical problems including chronic combined heart failure, type 2 diabetes on insulin, hypertension, COPD, GERD, seizure disorder, bipolar disorder, history of substance abuse, sensory neuropathy who was admitted to [facility] since 3/8 where she presented with confusion of about 1 day.  Patient was unable to provide any history.  Her mother who lives with her able to give history that she was at her usual self 2 days prior to admission.  At the outside hospital, she was found to be in HHS with blood sugars more than 1200.  Treated with insulin drip then subsequently changed to subcu insulin with no improvement of mentation.  She started spiking fever, blood culture reportedly with E. coli, urine culture not reported.  Treated with ceftriaxone.  With persistent encephalopathy despite treatment she was transferred to [facility] with neurology consultation. Mental status improving.  MRI did show multiple embolic stroke.   Assessment & Plan:   Principal Problem:   Metabolic encephalopathy Active Problems:   Bipolar disorder, unspecified ([place])   DM type 2 (diabetes mellitus, type 2)- poorly controlled   Ulcer of great toe, left, with fat layer exposed ([place])   Essential hypertension   Chronic systolic CHF (congestive heart failure) (HCC)   PVD (peripheral vascular disease) ([place])   Acute kidney injury superimposed on CKD ([place])   Bacteremia due to Escherichia coli   Acute metabolic encephalopathy   Cerebral thrombosis with cerebral infarction  Acute metabolic encephalopathy: Multifactorial encephalopathy.   HSS corrected but mental status did not correct hence transferred to [facility]. Her mental status is gradually improving and returning to normal.  Suspected prolonged delirium. Acute bacterial meningitis ruled  out.  HSV negative. All-time fall precautions.  Delirium precautions.  After much titration of her multiple medications including antidepressants and Seroquel, her mental status has much improved now.  Acute multifocal cardioembolic stroke: Rule out infective endocarditis/infective emboli. MRI 3/16 shows acute multifocal cardioembolic stroke.  Patient with history of peripheral vascular disease, previously used to be on Eliquis, was taken off.  May have underlying cardioembolic source. Currently neurologically improving.  No focal deficits except generalized weakness. 2D echocardiogram with severe valvular heart disease but no evidence of thrombosis or infection. MRA of the head and neck with no obvious large vessel occlusion. On aspirin and statin. TEE with no evidence of thromboembolism.  No evidence of bacterial vegetation. Neurology recommended aspirin Plavix for 3 weeks then aspirin alone.  Suspected meningo-encephalitis: Ruled out.  E. coli bacteremia: Blood cultures 3/8 , urine cultures 3/8 outside hospital with E. coli pansensitive.  Repeat cultures negative.  CT scan of the abdomen pelvis without any evidence of hydronephrosis or obstruction. Continue Rocephin for total 10 days.  Completed antibiotic therapy.  Repeat blood cultures negative.  Acute kidney injury with history of chronic kidney disease stage IIIa: Baseline creatinine about 1.4.  Gradually improving.  Urine output is improving.  Levels are improving. Off maintenance IV fluids.  Encourage oral intake.  Recheck in 1 week..  Hyperchloremic hypernatremia: Normalized.  Type 2 diabetes with HHS: Blood sugars fairly controlled.  Hemoglobin A1c is 14.   Continue current doses of insulin until oral intake stabilizes.  Chronic systolic heart failure: Compensated.  Holding diuretics.  Peripheral vascular disease/peripheral arterial stents: On aspirin.  Not active issue.  History of bipolar disorder/peripheral  neuropathy: Patient was on  amitriptyline 150 mg daily, resumed 50 mg daily at night 3/18, excessively sleepy.  Discontinued with improvement. Gabapentin 600 mg 3 times a day, reduce to 100 mg 3 times a day. Seroquel 800 mg at night, reduced to 400 mg.  Further reduced to 100 mg at night. Venlafaxine 150 mg daily.  Reduced to half dose.  Stabilizing.  Will monitor next 24 hours in the hospital.  If remains awake alert and improves oral intake, will transfer to skilled nursing facility by tomorrow.    DVT prophylaxis: heparin injection 5,000 Units Start: 01/04/21 0600   Code Status: Full code Family Communication: Mother at bedside 3/19. Disposition Plan: Status is: Inpatient  Remains inpatient appropriate because:IV treatments appropriate due to intensity of illness or inability to take PO and Inpatient level of care appropriate due to severity of illness   Dispo: The patient is from: Home              Anticipated d/c is to: SNF              Patient currently is not medically stable to d/c.   Difficult to place patient No         Consultants:   Neurology  Procedures:   Lumbar puncture  EEG  Antimicrobials:  Antibiotics Given (last 72 hours)    Date/Time Action Medication Dose Rate   01/07/21 1109 New Bag/Given   cefTRIAXone (ROCEPHIN) 2 g in sodium chloride 0.9 % 100 mL IVPB 2 g 200 mL/hr   01/08/21 1041 New Bag/Given   cefTRIAXone (ROCEPHIN) 2 g in sodium chloride 0.9 % 100 mL IVPB 2 g 200 mL/hr         Subjective: Patient seen and examined.  She is surprised with being so alert today, cracking jokes and smiling today.  No family at bedside. Episode of vomiting in the morning, patient thinks her mom fed her too much of rice burrito.   Objective: Vitals:   01/10/21 0236 01/10/21 0408 01/10/21 0909 01/10/21 1008  BP:  116/83 (!) 130/114 (!) 136/105  Pulse:  (!) 105 72 88  Resp:  18  20  Temp:  97.7 F (36.5 C)  98.8 F (37.1 C)  TempSrc:    Oral   SpO2:  100%  95%  Weight: 101.8 kg     Height:        Intake/Output Summary (Last 24 hours) at 01/10/2021 1040 Last data filed at 01/09/2021 1057 Gross per 24 hour  Intake 592 ml  Output -  Net 592 ml   Filed Weights   01/09/21 0400 01/10/21 0018 01/10/21 0236  Weight: 102.8 kg 101.8 kg 101.8 kg    Examination:  General: Frail and debilitated.  She looks older than her stated age.  Chronically sick looking.  On room air.  Not in any distress. Alert oriented x4. Cardiovascular: S1-S2 normal.  Regular rate rhythm.  No murmurs. Respiratory: Bilateral clear.  No added sounds. Gastrointestinal: Soft and nontender.  Bowel sounds present.   Ext: 1+ edema both upper extremities. Left arm more then right arm.  Improving. Neuro: Cranial nerves no deficit.  She has generalized weakness all 4 extremities but no focal deficits. 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  She has nonhealing ulcer on the left plantar aspect.  No cyanosis.    Data Reviewed: I have personally reviewed following labs and imaging studies  CBC: Recent Labs  Lab 01/06/21 0525 01/07/21 0555 01/08/21 0500 01/09/21 0600 01/10/21 0325  WBC  10.6* 8.9 9.1 11.9* 8.9  NEUTROABS 8.4* 6.7 6.8 10.1* 7.3  HGB 8.5* 8.0* 7.8* 9.0* 9.3*  HCT 26.7* 25.8* 25.2* 28.6* 30.0*  MCV 87.0 87.8 87.5 86.4 87.2  PLT 180 189 232 340 [id number]*   Basic Metabolic Panel: Recent Labs  Lab 01/05/21 0149 01/06/21 0525 01/07/21 0555 01/08/21 0500 01/09/21 0600 01/10/21 0325  NA 150* 142 140 139 141 137  K 4.0 3.6 4.0 4.4 4.0 4.5  CL 125* 116* 116* 115* 117* 107  CO2 21* 19* 19* 19* 20* 21*  GLUCOSE 209* 141* 79 119* 54* 202*  BUN 42* 37* 31* 31* 28* 27*  CREATININE 2.78* 2.54* 2.36* 2.27* 2.23* 2.20*  CALCIUM 7.8* 7.8* 7.8* 7.9* 8.5* 8.6*  MG 1.6*  --   --   --  1.8  --   PHOS 3.4  --   --   --  4.0  --    GFR: Estimated Creatinine Clearance: 41 mL/min (A) (by C-G formula based on SCr of 2.2 mg/dL (H)). Liver  Function Tests: Recent Labs  Lab 01/04/21 0313 01/05/21 0149 01/06/21 0525 01/07/21 0555  AST 15 10* 11* 9*  ALT '11 10 10 8  '$ ALKPHOS 88 62 71 62  BILITOT 0.3 0.6 0.7 0.7  PROT 5.9* 4.9* 5.5* 5.1*  ALBUMIN 1.6* 1.3* 1.6* 1.4*   No results for input(s): LIPASE, AMYLASE in the last 168 hours. Recent Labs  Lab 01/05/21 1300  AMMONIA 20   Coagulation Profile: No results for input(s): INR, PROTIME in the last 168 hours. Cardiac Enzymes: No results for input(s): CKTOTAL, CKMB, CKMBINDEX, TROPONINI in the last 168 hours. BNP (last 3 results) No results for input(s): PROBNP in the last 8760 hours. HbA1C: No results for input(s): HGBA1C in the last 72 hours. CBG: Recent Labs  Lab 01/09/21 1633 01/09/21 1656 01/09/21 2129 01/10/21 0632 01/10/21 0844  GLUCAP 57* 94 88 169* 221*   Lipid Profile: No results for input(s): CHOL, HDL, LDLCALC, TRIG, CHOLHDL, LDLDIRECT in the last 72 hours. Thyroid Function Tests: No results for input(s): TSH, T4TOTAL, FREET4, T3FREE, THYROIDAB in the last 72 hours. Anemia Panel: No results for input(s): VITAMINB12, FOLATE, FERRITIN, TIBC, IRON, RETICCTPCT in the last 72 hours. Sepsis Labs: No results for input(s): PROCALCITON, LATICACIDVEN in the last 168 hours.  Recent Results (from the past 240 hour(s))  CSF culture w Gram Stain     Status: None   Collection Time: 01/04/21 11:07 AM   Specimen: PATH Cytology CSF; Cerebrospinal Fluid  Result Value Ref Range Status   Specimen Description CSF  Final   Special Requests NONE  Final   Gram Stain   Final    WBC PRESENT, PREDOMINANTLY MONONUCLEAR NO ORGANISMS SEEN CYTOSPIN SMEAR    Culture   Final    NO GROWTH 3 DAYS Performed at [facility], [address] [place], [place] [address]    Report Status 01/07/2021 FINAL  Final  Anaerobic culture w Gram Stain     Status: None   Collection Time: 01/04/21 11:07 AM   Specimen: PATH Cytology CSF; Cerebrospinal Fluid  Result Value Ref Range  Status   Specimen Description CSF  Final   Special Requests NONE  Final   Culture   Final    NO ANAEROBES ISOLATED Performed at [facility], [place] [address] [place], [place] [address]    Report Status 01/09/2021 FINAL  Final         Radiology Studies: ECHO TEE  Result Date:  01/08/2021    TRANSESOPHOGEAL ECHO REPORT   Patient Name:   MARGUERITA STEED Date of Exam: 01/08/2021 Medical Rec #:  [id number]       Height:       69.0 in Accession #:    [id number]      Weight:       215.4 lb Date of Birth:  1975/12/11       BSA:          2.132 m Patient Age:    89 years        BP:           98/67 mmHg Patient Gender: F               HR:           100 bpm. Exam Location:  Inpatient Procedure: Transesophageal Echo, 3D Echo, Cardiac Doppler, Color Doppler and            Saline Contrast Bubble Study Indications:     I34.2 Nonrheumatic mitral (valve) stenosis  History:         Patient has prior history of Echocardiogram examinations, most                  recent 01/06/2021. Stroke and COPD, Mitral Valve Disease and                  Mitral Stenosis; Risk Factors:Diabetes, Hypertension and                  Current Smoker. Mitral stenosis. Heroin overdose. Cocaine use.  Sonographer:     Roseanna Rainbow RDCS Referring Phys:  [id number] Tami Lin [facility] Diagnosing Phys: Kirk Ruths MD PROCEDURE: After discussion of the risks and benefits of a TEE, an informed consent was obtained from the patient. The transesophogeal probe was passed without difficulty through the esophogus of the patient. Imaged were obtained with the patient in a left lateral decubitus position. Sedation performed by different physician. The patient was monitored while under deep sedation. Anesthestetic sedation was provided intravenously by Anesthesiology: '140mg'$  of Propofol. The patient's vital signs; including heart rate, blood pressure, and oxygen saturation; remained stable throughout the procedure. The patient developed no complications during  the procedure. IMPRESSIONS  1. Rheumatic MV with moderate MS (mean gradient 8 mmHg; MVA 1.65 cm2 by pressure halftime); severe MR.  2. Left ventricular ejection fraction, by estimation, is 40 to 45%. The left ventricle has mildly decreased function. The left ventricle demonstrates regional wall motion abnormalities (see scoring diagram/findings for description).  3. Right ventricular systolic function is normal. The right ventricular size is normal.  4. Left atrial size was severely dilated. No left atrial/left atrial appendage thrombus was detected.  5. 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 Moderate mitral stenosis.  6. The aortic valve is normal in structure. Aortic valve regurgitation is not visualized. FINDINGS  Left Ventricle: Left ventricular ejection fraction, by estimation, is 40 to 45%. The left ventricle has mildly decreased function. The left ventricle demonstrates regional wall motion abnormalities. The left ventricular internal cavity size was normal in size. Right Ventricle: The right ventricular size is normal. . Right ventricular systolic function is normal. Left Atrium: Left atrial size was severely dilated. Spontaneous echo contrast was present in the left atrium and left atrial appendage. No left atrial/left atrial appendage thrombus was detected. Right Atrium: Right atrial size was normal in size. Pericardium: There is no evidence of pericardial effusion. Mitral Valve: The mitral  valve is rheumatic. Severe mitral valve regurgitation. Moderate mitral valve stenosis. MV peak gradient, 13.9 mmHg. The mean mitral valve gradient is 8.0 mmHg. Tricuspid Valve: The tricuspid valve is normal in structure. Tricuspid valve regurgitation is mild. Aortic Valve: The aortic valve is normal in structure. Aortic valve regurgitation is not visualized. Pulmonic Valve: The pulmonic valve was normal in structure. Pulmonic valve regurgitation is trivial. Aorta: The aortic root is normal in  size and structure. There is minimal (Grade I) plaque involving the descending aorta. IAS/Shunts: No atrial level shunt detected by color flow Doppler. Agitated saline contrast was given intravenously to evaluate for intracardiac shunting. Additional Comments: Rheumatic MV with moderate MS (mean gradient 8 mmHg; MVA 1.65 cm2 by pressure halftime); severe MR.  MITRAL VALVE MV Area (PHT): 2.59 cm MV Peak grad:  13.9 mmHg MV Mean grad:  8.0 mmHg MV Vmax:       1.86 m/s MV Vmean:      132.3 cm/s MR Peak grad:    96.8 mmHg MR Mean grad:    64.0 mmHg MR Vmax:         492.00 cm/s MR Vmean:        365.0 cm/s MR PISA:         3.08 cm MR PISA Eff ROA: 41 mm MR PISA Radius:  0.70 cm Kirk Ruths MD Electronically signed by Kirk Ruths MD Signature Date/Time: 01/08/2021/4:10:01 PM    Final    VAS [place] UPPER EXTREMITY VENOUS DUPLEX  Result Date: 01/09/2021 UPPER VENOUS STUDY  Indications: Edema, and PICC line left arm Limitations: Patient positioning, bandages and line. Comparison Study: No prior study on file Performing Technologist: Sharion Dove RVS  Examination Guidelines: A complete evaluation includes B-mode imaging, spectral Doppler, color Doppler, and power Doppler as needed of all accessible portions of each vessel. Bilateral testing is considered an integral part of a complete examination. Limited examinations for reoccurring indications may be performed as noted.  Right Findings: +----------+------------+---------+-----------+----------+--------------+ RIGHT     CompressiblePhasicitySpontaneousProperties   Summary     +----------+------------+---------+-----------+----------+--------------+ IJV                                                 Not visualized +----------+------------+---------+-----------+----------+--------------+ Subclavian    Full       Yes       Yes                             +----------+------------+---------+-----------+----------+--------------+ Axillary      Full        Yes       Yes                             +----------+------------+---------+-----------+----------+--------------+ Brachial      Full       Yes       Yes                             +----------+------------+---------+-----------+----------+--------------+ Radial        Full                                                 +----------+------------+---------+-----------+----------+--------------+  Ulnar                                               Not visualized +----------+------------+---------+-----------+----------+--------------+ Cephalic      Full                                                 +----------+------------+---------+-----------+----------+--------------+ Basilic       Full                                                 +----------+------------+---------+-----------+----------+--------------+  Left Findings: +----------+------------+---------+-----------+----------+-------+ LEFT      CompressiblePhasicitySpontaneousPropertiesSummary +----------+------------+---------+-----------+----------+-------+ IJV           Full       Yes       Yes                      +----------+------------+---------+-----------+----------+-------+ Subclavian               Yes       Yes                      +----------+------------+---------+-----------+----------+-------+ Axillary                 Yes       Yes               PICC   +----------+------------+---------+-----------+----------+-------+ Brachial      Full       Yes       Yes                      +----------+------------+---------+-----------+----------+-------+ Cephalic      None                                          +----------+------------+---------+-----------+----------+-------+ Basilic       None                                          +----------+------------+---------+-----------+----------+-------+  Summary:  Right: No evidence of deep vein thrombosis in the  upper extremity. No evidence of superficial vein thrombosis in the upper extremity.  Left: No evidence of deep vein thrombosis in the upper extremity. Findings consistent with acute superficial vein thrombosis involving the left cephalic vein and left basilic vein.  *See table(s) above for measurements and observations.    Preliminary         Scheduled Meds: . aspirin  300 mg Rectal Daily   Or  . aspirin  325 mg Oral Daily  . Chlorhexidine Gluconate Cloth  6 each Topical Daily  . feeding supplement  1 Container Oral TID BM  . gabapentin  100 mg Oral TID  . heparin  5,000 Units Subcutaneous Q8H  . insulin aspart  4 Units Subcutaneous TID WC  . insulin glargine  10 Units Subcutaneous  BID  . metoprolol tartrate  12.5 mg Oral BID  . multivitamin with minerals  1 tablet Oral Daily  . QUEtiapine  100 mg Oral QHS  . venlafaxine XR  75 mg Oral Daily   Continuous Infusions:    LOS: 6 days    Time spent: 30 minutes    Barb Merino, MD [facility] Pager [phone]

## 2021-01-11 DIAGNOSIS — B9620 Unspecified Escherichia coli [E. coli] as the cause of diseases classified elsewhere: Secondary | ICD-10-CM | POA: Diagnosis not present

## 2021-01-11 LAB — SARS CORONAVIRUS 2 (TAT 6-24 HRS): SARS Coronavirus 2: NEGATIVE

## 2021-01-11 LAB — CBC WITH DIFFERENTIAL/PLATELET
Abs Immature Granulocytes: 0.06 10*3/uL (ref 0.00–0.07)
Basophils Absolute: 0 10*3/uL (ref 0.0–0.1)
Basophils Relative: 0 %
Eosinophils Absolute: 0.1 10*3/uL (ref 0.0–0.5)
Eosinophils Relative: 1 %
HCT: 25.6 % — ABNORMAL LOW (ref 36.0–46.0)
Hemoglobin: 8.5 g/dL — ABNORMAL LOW (ref 12.0–15.0)
Immature Granulocytes: 1 %
Lymphocytes Relative: 15 %
Lymphs Abs: 1.2 10*3/uL (ref 0.7–4.0)
MCH: 28 pg (ref 26.0–34.0)
MCHC: 33.2 g/dL (ref 30.0–36.0)
MCV: 84.2 fL (ref 80.0–100.0)
Monocytes Absolute: 1.2 10*3/uL — ABNORMAL HIGH (ref 0.1–1.0)
Monocytes Relative: 14 %
Neutro Abs: 5.8 10*3/uL (ref 1.7–7.7)
Neutrophils Relative %: 69 %
Platelets: 378 10*3/uL (ref 150–400)
RBC: 3.04 MIL/uL — ABNORMAL LOW (ref 3.87–5.11)
RDW: 17.5 % — ABNORMAL HIGH (ref 11.5–15.5)
WBC: 8.3 10*3/uL (ref 4.0–10.5)
nRBC: 0 % (ref 0.0–0.2)

## 2021-01-11 LAB — BASIC METABOLIC PANEL
Anion gap: 5 (ref 5–15)
BUN: 26 mg/dL — ABNORMAL HIGH (ref 6–20)
CO2: 23 mmol/L (ref 22–32)
Calcium: 8.3 mg/dL — ABNORMAL LOW (ref 8.9–10.3)
Chloride: 108 mmol/L (ref 98–111)
Creatinine, Ser: 2.02 mg/dL — ABNORMAL HIGH (ref 0.44–1.00)
GFR, Estimated: 30 mL/min — ABNORMAL LOW (ref >60)
Glucose, Bld: 167 mg/dL — ABNORMAL HIGH (ref 70–99)
Potassium: 5.4 mmol/L — ABNORMAL HIGH (ref 3.5–5.1)
Sodium: 136 mmol/L (ref 135–145)

## 2021-01-11 LAB — GLUCOSE, CAPILLARY
Glucose-Capillary: 113 mg/dL — ABNORMAL HIGH (ref 70–99)
Glucose-Capillary: 116 mg/dL — ABNORMAL HIGH (ref 70–99)
Glucose-Capillary: 162 mg/dL — ABNORMAL HIGH (ref 70–99)
Glucose-Capillary: 253 mg/dL — ABNORMAL HIGH (ref 70–99)

## 2021-01-11 LAB — PHOSPHORUS: Phosphorus: 3.3 mg/dL (ref 2.5–4.6)

## 2021-01-11 LAB — MAGNESIUM: Magnesium: 1.5 mg/dL — ABNORMAL LOW (ref 1.7–2.4)

## 2021-01-11 MED ORDER — PROCHLORPERAZINE MALEATE 10 MG PO TABS
10.0000 mg | ORAL_TABLET | Freq: Four times a day (QID) | ORAL | Status: DC | PRN
  Filled 2021-01-11 (×2): qty 1

## 2021-01-11 MED ORDER — METOCLOPRAMIDE HCL 10 MG PO TABS
5.0000 mg | ORAL_TABLET | Freq: Three times a day (TID) | ORAL | Status: DC
  Administered 2021-01-11 – 2021-01-12 (×5): 5 mg via ORAL
  Filled 2021-01-11 (×5): qty 1

## 2021-01-11 MED ORDER — QUETIAPINE FUMARATE 200 MG PO TABS
200.0000 mg | ORAL_TABLET | Freq: Every day | ORAL | Status: DC
  Administered 2021-01-11: 200 mg via ORAL
  Filled 2021-01-11: qty 1

## 2021-01-11 MED ORDER — POLYETHYLENE GLYCOL 3350 17 G PO PACK
17.0000 g | PACK | Freq: Every day | ORAL | Status: DC
  Administered 2021-01-11 – 2021-01-12 (×2): 17 g via ORAL
  Filled 2021-01-11 (×2): qty 1

## 2021-01-11 MED ORDER — AMITRIPTYLINE HCL 25 MG PO TABS
75.0000 mg | ORAL_TABLET | Freq: Every day | ORAL | Status: DC
  Administered 2021-01-11: 75 mg via ORAL
  Filled 2021-01-11: qty 3

## 2021-01-11 NOTE — TOC Progression Note (Signed)
Transition of Care [facility]) - Progression Note    Patient Details  Name: MACRINA ISENBARGER MRN: [id number] Date of Birth: December 18, 1975  Transition of Care [facility]) CM/SW [place], [place] Phone Number: 01/11/2021, 11:23 AM  Clinical Narrative:   CSW spoke with Pamala Hurry with [facility] that patient continues with altered mental status today, unable to complete mental status exam of [facility] interview. Pamala Hurry to complete screening and will provide [facility] number by the end of the day today. Per MD, readjusting meds due to vomiting, hopeful for DC to SNF tomorrow. CSW spoke with Arbie Cookey in Admissions at [facility] for update. CSW to follow.    Expected Discharge Plan: Skilled Nursing Facility Barriers to Discharge: Continued Medical Work up,Awaiting State Approval (PAS[facility])  Expected Discharge Plan and Services Expected Discharge Plan: [place]: [place] arrangements for the past 2 months: Single Family Home                                       Social Determinants of Health (SDOH) Interventions    Readmission Risk Interventions No flowsheet data found.

## 2021-01-11 NOTE — Progress Notes (Signed)
PT Cancellation Note  Patient Details Name: [name] MRN: [id number] DOB: 06-08-1976   Cancelled Treatment:    Reason Eval/Treat Not Completed: Patient declined, stating "I've been throwing up all night." Will continue to follow.    Thelma Comp 01/11/2021, 12:22 PM   Rolinda Roan, PT, DPT Acute Rehabilitation Services Pager: [phone] Office: [phone]

## 2021-01-11 NOTE — Progress Notes (Signed)
  Speech Language Pathology Treatment: Dysphagia  Patient Details Name: [name] MRN: [id number] DOB: [birth year] Today's Date: 01/11/2021 Time: [id number] SLP Time Calculation (min) (ACUTE ONLY): 11 min  Assessment / Plan / Recommendation Clinical Impression  No overt s/s of dysphagia were noted during today's treatment in which she was agreeable to trials of thin liquids and twinkies. She was oriented to her current location and was also oriented to time with a verbal cue to use an external aid. Recommend continuation of her current diet and SLP will continue to follow up for cognitive treatment only.   HPI HPI: 45 year old female with extensive medical problems including chronic gastric heart failure, type 2 diabetes on insulin, hypertension, COPD, GERD, seizure disorder, bipolar disorder, history of substance abuse, sensory neuropathy who was admitted to [facility], then transferred to [facility] with neurology consultation. Dx metabolic encephalopathy, E. coli bacteremia, hypernatremia, AKI. 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      SLP Plan  Continue with current plan of care       Recommendations  Diet recommendations: Regular;Thin liquid Liquids provided via: Cup;Straw Medication Administration: Crushed with puree Supervision: Staff to assist with self feeding;Full supervision/cueing for compensatory strategies Compensations: Slow rate;Small sips/bites;Minimize environmental distractions;Follow solids with liquid Postural Changes and/or Swallow Maneuvers: Seated upright 90 degrees;Upright 30-60 min after meal                Oral Care Recommendations: Oral care BID Follow up Recommendations: Skilled Nursing facility SLP Visit Diagnosis: Dysphagia, oral phase  (R13.11) Plan: Continue with current plan of care       GO                Jeanine Luz., SLP Student 01/11/2021, 3:13 PM

## 2021-01-11 NOTE — Progress Notes (Signed)
Pt [name] has refused her medications and nutritional supplement for this evening due to projectile vomiting earlier on 3/20 day shift and current complaints of nausea. Patient has been educated.

## 2021-01-11 NOTE — Progress Notes (Signed)
PROGRESS NOTE    [name]  [id number] DOB: Dec 11, 1975 DOA: 01/04/2021 PCP: [facility]    Brief Narrative:  45 year old female with extensive medical problems including chronic combined heart failure, type 2 diabetes on insulin, hypertension, COPD, GERD, seizure disorder, bipolar disorder, history of substance abuse, sensory neuropathy who was admitted to [facility] since 3/8 where she presented with confusion of about 1 day.  Patient was unable to provide any history.  Her mother who lives with her able to give history that she was at her usual self 2 days prior to admission.  At the outside hospital, she was found to be in HHS with blood sugars more than 1200.  Treated with insulin drip then subsequently changed to subcu insulin with no improvement of mentation.  She started spiking fever, blood culture reportedly with E. coli, urine culture not reported.  Treated with ceftriaxone.  With persistent encephalopathy despite treatment she was transferred to [facility] with neurology consultation. Mental status improving.  MRI did show multiple embolic stroke.   Assessment & Plan:   Principal Problem:   Metabolic encephalopathy Active Problems:   Bipolar disorder, unspecified ([place])   DM type 2 (diabetes mellitus, type 2)- poorly controlled   Ulcer of great toe, left, with fat layer exposed ([place])   Essential hypertension   Chronic systolic CHF (congestive heart failure) (HCC)   PVD (peripheral vascular disease) ([place])   Acute kidney injury superimposed on CKD ([place])   Bacteremia due to Escherichia coli   Acute metabolic encephalopathy   Cerebral thrombosis with cerebral infarction  Acute metabolic encephalopathy: Multifactorial encephalopathy.   HSS corrected but mental status did not correct hence transferred to [facility]. Her mental status is gradually improving and returning to normal.  Suspected prolonged delirium. Acute bacterial meningitis ruled  out.  HSV negative. All-time fall precautions.  Delirium precautions.  After much titration of her multiple medications including antidepressants and Seroquel, her mental status has much improved now.  Acute multifocal cardioembolic stroke: Rule out infective endocarditis/infective emboli. MRI 3/16 shows acute multifocal cardioembolic stroke.  Patient with history of peripheral vascular disease, previously used to be on Eliquis, was taken off.  May have underlying cardioembolic source. Currently neurologically improving.  No focal deficits except generalized weakness. 2D echocardiogram with severe valvular heart disease but no evidence of thrombosis or infection. MRA of the head and neck with no obvious large vessel occlusion. On aspirin and statin. TEE with no evidence of thromboembolism.  No evidence of bacterial vegetation. Neurology recommended aspirin Plavix for 3 weeks then aspirin alone.  Suspected meningo-encephalitis: Ruled out.  E. coli bacteremia: Blood cultures 3/8 , urine cultures 3/8 outside hospital with E. coli pansensitive.  Repeat cultures negative.  CT scan of the abdomen pelvis without any evidence of hydronephrosis or obstruction. Continue Rocephin for total 10 days.  Completed antibiotic therapy.  Repeat blood cultures negative.  Acute kidney injury with history of chronic kidney disease stage IIIa: Baseline creatinine about 1.4.  Gradually improving.  Urine output is improving.  Levels are improving. Off maintenance IV fluids.  Encourage oral intake.  Recheck in 1 week at nursing home.  Hyperchloremic hypernatremia: Normalized.  Type 2 diabetes with HHS: Blood sugars fairly controlled.  Hemoglobin A1c is 14.   Continue current doses of insulin until oral intake stabilizes.  Chronic systolic heart failure: Compensated.  Holding diuretics.  Peripheral vascular disease/peripheral arterial stents: On aspirin.  Not active issue.  History of bipolar disorder/peripheral  neuropathy:  Patient was on  amitriptyline 150 mg daily, resumed 50 mg daily at night 3/18, excessively sleepy.  Discontinued with improvement. Gabapentin 600 mg 3 times a day, reduce to 100 mg 3 times a day. Seroquel 800 mg at night, reduced to 400 mg.  We will keep on 200 mg at night. Venlafaxine 150 mg daily.  Reduced to half dose.  Recurrent nausea and vomiting: No obvious cause.  Abdomen is benign and nontender.  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 Bowel regimen, will start on MiraLAX as scheduled. We will use metoclopramide scheduled before meals to improve her intake.  Continue to work with PT OT.  If patient able to tolerate diet by tomorrow morning, will discharge to a skilled nursing facility.    DVT prophylaxis: heparin injection 5,000 Units Start: 01/04/21 0600   Code Status: Full code Family Communication: Mother at bedside 3/ 43. Disposition Plan: Status is: Inpatient  Remains inpatient appropriate because:IV treatments appropriate due to intensity of illness or inability to take PO and Inpatient level of care appropriate due to severity of illness   Dispo: The patient is from: Home              Anticipated d/c is to: SNF              Patient currently is not medically stable to d/c.   Difficult to place patient No         Consultants:   Neurology  Procedures:   Lumbar puncture  EEG  Antimicrobials:  Antibiotics Given (last 72 hours)    None         Subjective: Patient seen and examined.  She had 2 episodes of vomiting early morning and is trying to eat some food.  Denies any complaints. She is a still not eating adequate, however wants to go to nursing home today. Her mom has to travel all the way from [place], so she wants her to go to [place] area nursing home as soon as possible.  Blood sugars are better.  Mentation is better.  No more hallucinations.   Objective: Vitals:   01/10/21 2329 01/11/21 0408 01/11/21 0500 01/11/21 0700   BP: (!) 149/90 (!) 150/92  (!) 128/93  Pulse: (!) 123 (!) 125  (!) 114  Resp: '20 18  18  '$ Temp: 99.4 F (37.4 C) 98.9 F (37.2 C)  98.7 F (37.1 C)  TempSrc: Oral Oral  Oral  SpO2: 100% 100%  99%  Weight:   103.3 kg   Height:        Intake/Output Summary (Last 24 hours) at 01/11/2021 1123 Last data filed at 01/11/2021 1022 Gross per 24 hour  Intake 832 ml  Output 1700 ml  Net -868 ml   Filed Weights   01/10/21 0018 01/10/21 0236 01/11/21 0500  Weight: 101.8 kg 101.8 kg 103.3 kg    Examination:  General: Frail and debilitated. Chronically sick looking.  On room air.  Not in any distress. Alert oriented x4.  Alert and talkative today. Cardiovascular: S1-S2 normal.  Regular rate rhythm.  No murmurs. Respiratory: Bilateral clear.  No added sounds. Gastrointestinal: Soft and nontender.  Bowel sounds present.   Ext: No edema.   Neuro: Cranial nerves no deficit.  She has generalized weakness all 4 extremities but no focal deficits. 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  She has nonhealing ulcer on the left plantar aspect.  No cyanosis.    Data Reviewed: I have personally reviewed following labs and  imaging studies  CBC: Recent Labs  Lab 01/06/21 0525 01/07/21 0555 01/08/21 0500 01/09/21 0600 01/10/21 0325  WBC 10.6* 8.9 9.1 11.9* 8.9  NEUTROABS 8.4* 6.7 6.8 10.1* 7.3  HGB 8.5* 8.0* 7.8* 9.0* 9.3*  HCT 26.7* 25.8* 25.2* 28.6* 30.0*  MCV 87.0 87.8 87.5 86.4 87.2  PLT 180 189 232 340 [id number]*   Basic Metabolic Panel: Recent Labs  Lab 01/05/21 0149 01/06/21 0525 01/07/21 0555 01/08/21 0500 01/09/21 0600 01/10/21 0325  NA 150* 142 140 139 141 137  K 4.0 3.6 4.0 4.4 4.0 4.5  CL 125* 116* 116* 115* 117* 107  CO2 21* 19* 19* 19* 20* 21*  GLUCOSE 209* 141* 79 119* 54* 202*  BUN 42* 37* 31* 31* 28* 27*  CREATININE 2.78* 2.54* 2.36* 2.27* 2.23* 2.20*  CALCIUM 7.8* 7.8* 7.8* 7.9* 8.5* 8.6*  MG 1.6*  --   --   --  1.8  --   PHOS 3.4  --   --   --   4.0  --    GFR: Estimated Creatinine Clearance: 41.3 mL/min (A) (by C-G formula based on SCr of 2.2 mg/dL (H)). Liver Function Tests: Recent Labs  Lab 01/05/21 0149 01/06/21 0525 01/07/21 0555  AST 10* 11* 9*  ALT '10 10 8  '$ ALKPHOS 62 71 62  BILITOT 0.6 0.7 0.7  PROT 4.9* 5.5* 5.1*  ALBUMIN 1.3* 1.6* 1.4*   No results for input(s): LIPASE, AMYLASE in the last 168 hours. Recent Labs  Lab 01/05/21 1300  AMMONIA 20   Coagulation Profile: No results for input(s): INR, PROTIME in the last 168 hours. Cardiac Enzymes: No results for input(s): CKTOTAL, CKMB, CKMBINDEX, TROPONINI in the last 168 hours. BNP (last 3 results) No results for input(s): PROBNP in the last 8760 hours. HbA1C: No results for input(s): HGBA1C in the last 72 hours. CBG: Recent Labs  Lab 01/10/21 1133 01/10/21 1626 01/10/21 2141 01/11/21 0512 01/11/21 0701  GLUCAP 156* 155* 119* 113* 116*   Lipid Profile: No results for input(s): CHOL, HDL, LDLCALC, TRIG, CHOLHDL, LDLDIRECT in the last 72 hours. Thyroid Function Tests: No results for input(s): TSH, T4TOTAL, FREET4, T3FREE, THYROIDAB in the last 72 hours. Anemia Panel: No results for input(s): VITAMINB12, FOLATE, FERRITIN, TIBC, IRON, RETICCTPCT in the last 72 hours. Sepsis Labs: No results for input(s): PROCALCITON, LATICACIDVEN in the last 168 hours.  Recent Results (from the past 240 hour(s))  CSF culture w Gram Stain     Status: None   Collection Time: 01/04/21 11:07 AM   Specimen: PATH Cytology CSF; Cerebrospinal Fluid  Result Value Ref Range Status   Specimen Description CSF  Final   Special Requests NONE  Final   Gram Stain   Final    WBC PRESENT, PREDOMINANTLY MONONUCLEAR NO ORGANISMS SEEN CYTOSPIN SMEAR    Culture   Final    NO GROWTH 3 DAYS Performed at [facility], [address] [place], [place] [address]    Report Status 01/07/2021 FINAL  Final  Anaerobic culture w Gram Stain     Status: None   Collection Time:  01/04/21 11:07 AM   Specimen: PATH Cytology CSF; Cerebrospinal Fluid  Result Value Ref Range Status   Specimen Description CSF  Final   Special Requests NONE  Final   Culture   Final    NO ANAEROBES ISOLATED Performed at [facility], [place] [address], [place], [place] [address]    Report Status 01/09/2021 FINAL  Final  Radiology Studies: No results found.      Scheduled Meds: . aspirin  300 mg Rectal Daily   Or  . aspirin  325 mg Oral Daily  . Chlorhexidine Gluconate Cloth  6 each Topical Daily  . feeding supplement  1 Container Oral TID BM  . gabapentin  100 mg Oral TID  . heparin  5,000 Units Subcutaneous Q8H  . insulin aspart  4 Units Subcutaneous TID WC  . insulin glargine  10 Units Subcutaneous BID  . metoCLOPramide  5 mg Oral TID AC & HS  . metoprolol tartrate  12.5 mg Oral BID  . multivitamin with minerals  1 tablet Oral Daily  . polyethylene glycol  17 g Oral Daily  . QUEtiapine  200 mg Oral QHS  . venlafaxine XR  75 mg Oral Daily   Continuous Infusions:    LOS: 7 days    Time spent: 30 minutes    Barb Merino, MD [facility] Pager [phone]

## 2021-01-11 NOTE — Progress Notes (Signed)
Pt Nile Dear had 2 episodes of vomiting within the past 15 mins as of 0408 and 0423. The first episode was of projectile vomiting, and the other was in a emesis bag. Total fluid loss of 1000cc.   Night MD has been notified.

## 2021-01-12 DIAGNOSIS — B9620 Unspecified Escherichia coli [E. coli] as the cause of diseases classified elsewhere: Secondary | ICD-10-CM | POA: Diagnosis not present

## 2021-01-12 LAB — CBC WITH DIFFERENTIAL/PLATELET
Abs Immature Granulocytes: 0.06 10*3/uL (ref 0.00–0.07)
Basophils Absolute: 0 10*3/uL (ref 0.0–0.1)
Basophils Relative: 1 %
Eosinophils Absolute: 0.1 10*3/uL (ref 0.0–0.5)
Eosinophils Relative: 1 %
HCT: 26.1 % — ABNORMAL LOW (ref 36.0–46.0)
Hemoglobin: 8.3 g/dL — ABNORMAL LOW (ref 12.0–15.0)
Immature Granulocytes: 1 %
Lymphocytes Relative: 20 %
Lymphs Abs: 1.6 10*3/uL (ref 0.7–4.0)
MCH: 27.7 pg (ref 26.0–34.0)
MCHC: 31.8 g/dL (ref 30.0–36.0)
MCV: 87 fL (ref 80.0–100.0)
Monocytes Absolute: 1.6 10*3/uL — ABNORMAL HIGH (ref 0.1–1.0)
Monocytes Relative: 20 %
Neutro Abs: 4.4 10*3/uL (ref 1.7–7.7)
Neutrophils Relative %: 57 %
Platelets: 370 10*3/uL (ref 150–400)
RBC: 3 MIL/uL — ABNORMAL LOW (ref 3.87–5.11)
RDW: 17.8 % — ABNORMAL HIGH (ref 11.5–15.5)
WBC: 7.8 10*3/uL (ref 4.0–10.5)
nRBC: 0 % (ref 0.0–0.2)

## 2021-01-12 LAB — GLUCOSE, CAPILLARY: Glucose-Capillary: 76 mg/dL (ref 70–99)

## 2021-01-12 MED ORDER — MAGNESIUM SULFATE 2 GM/50ML IV SOLN
2.0000 g | Freq: Once | INTRAVENOUS | Status: AC
  Administered 2021-01-12: 2 g via INTRAVENOUS
  Filled 2021-01-12: qty 50

## 2021-01-12 MED ORDER — QUETIAPINE FUMARATE 200 MG PO TABS: 200.0000 mg | ORAL_TABLET | Freq: Every day | ORAL | Status: DC

## 2021-01-12 MED ORDER — AMITRIPTYLINE HCL 150 MG PO TABS: 75.0000 mg | ORAL_TABLET | Freq: Every day | ORAL | Status: DC

## 2021-01-12 MED ORDER — INSULIN GLARGINE 100 UNIT/ML ~~LOC~~ SOLN: 10.0000 [IU] | Freq: Two times a day (BID) | SUBCUTANEOUS | 11 refills | Status: DC

## 2021-01-12 MED ORDER — MAGNESIUM OXIDE 400 (241.3 MG) MG PO TABS: 400.0000 mg | ORAL_TABLET | Freq: Every day | ORAL | Status: DC

## 2021-01-12 MED ORDER — VENLAFAXINE HCL ER 75 MG PO CP24: 300.0000 mg | ORAL_CAPSULE | Freq: Every day | ORAL | Status: DC

## 2021-01-12 MED ORDER — VENLAFAXINE HCL ER 75 MG PO CP24: 75.0000 mg | ORAL_CAPSULE | Freq: Every day | ORAL | Status: DC

## 2021-01-12 MED ORDER — ASPIRIN 325 MG PO TABS: 325.0000 mg | ORAL_TABLET | Freq: Every day | ORAL | Status: DC

## 2021-01-12 MED ORDER — CLOPIDOGREL BISULFATE 75 MG PO TABS: 75.0000 mg | ORAL_TABLET | Freq: Every day | ORAL | 0 refills | Status: AC

## 2021-01-12 MED ORDER — QUETIAPINE FUMARATE 200 MG PO TABS: 400.0000 mg | ORAL_TABLET | Freq: Every day | ORAL | Status: DC

## 2021-01-12 MED ORDER — GABAPENTIN 100 MG PO CAPS: 100.0000 mg | ORAL_CAPSULE | Freq: Three times a day (TID) | ORAL | Status: DC

## 2021-01-12 NOTE — Discharge Summary (Signed)
Physician Discharge Summary  RODNISHA LAGACE [id number] DOB: Jan 17, 1976 DOA: 01/04/2021  PCP: [facility]  Admit date: 01/04/2021 Discharge date: 01/12/2021  Admitted From: Outside hospital.  Admitted from home. Disposition: Skilled nursing facility.  Recommendations for Outpatient Follow-up:  1. Follow up with PCP in 1-2 weeks 2. Please obtain BMP/CBC/magnesium/phosphorus in one week   Home Health: Not applicable Equipment/Devices: Not applicable  Discharge Condition: Stable CODE STATUS: Full code Diet recommendation: Regular diet until oral intake improves and adequate, then go back on carb controlled diet.  Discharge summary: Patient with complicated medical history and extensive hospitalization.  As below. 45 year old female with extensive medical problems including chronic combined heart failure, type 2 diabetes on insulin, hypertension, COPD, GERD, seizure disorder, bipolar disorder, history of substance abuse, sensory neuropathy who was admitted to [facility] since 3/8 where she presented with confusion of about 1 day.  Patient was unable to provide any history.  Her mother who lives with her able to give history that she was at her usual self 2 days prior to admission.  At the outside hospital, she was found to be in HHS with blood sugars more than 1200.  Treated with insulin drip then subsequently changed to subcu insulin with no improvement of mentation.  She started spiking fever, blood culture reportedly with E. coli, urine culture not reported.  Treated with ceftriaxone.  With persistent encephalopathy despite treatment she was transferred to [facility] with neurology consultation. Mental status improving.  MRI did show multiple embolic stroke.  Patient is still in the hospital with multiple issues and now stabilizing to transfer to a skilled nursing facility.   Assessment & plan of care discussed as bullet points below:   Acute metabolic  encephalopathy: Multifactorial encephalopathy.   HSS corrected but mental status did not correct hence transferred to [facility]. Her mental status is gradually improving and returning to normal.  Suspected prolonged delirium. Acute bacterial meningitis ruled out.  HSV negative. All-time fall precautions.  Delirium precautions.  After much titration of her multiple medications including antidepressants and Seroquel, her mental status has much improved now.  Acute multifocal cardioembolic stroke: Rule out infective endocarditis/infective emboli. MRI 3/16 shows acute multifocal cardioembolic stroke.  Patient with history of peripheral vascular disease, previously used to be on Eliquis, was taken off.  May have underlying cardioembolic source. Currently neurologically improving.  No focal deficits except generalized weakness. 2D echocardiogram with severe valvular heart disease but no evidence of thrombosis or infection. MRA of the head and neck with no obvious large vessel occlusion. On aspirin and statin. TEE with no evidence of thromboembolism.  No evidence of bacterial vegetation. Neurology recommended aspirin Plavix for 3 weeks then aspirin alone.  Suspected meningo-encephalitis: Ruled out.  E. coli bacteremia: Blood cultures 3/8 , urine cultures 3/8 outside hospital with E. coli pansensitive.  Repeat cultures negative.  CT scan of the abdomen pelvis without any evidence of hydronephrosis or obstruction. Continue Rocephin for total 10 days.  Completed antibiotic therapy.  Repeat blood cultures negative.  Acute kidney injury with history of chronic kidney disease stage IIIa: Baseline creatinine about 1.4.  Gradually improving.  Urine output is improving.  Levels are improving. Off maintenance IV fluids.  Encourage oral intake.  Recheck in 1 week at nursing home.  Hyperchloremic hypernatremia: Normalized.  Type 2 diabetes with HHS: Blood sugars fairly controlled.  Hemoglobin A1c is 14.    Continue current doses of insulin until oral intake stabilizes. Patient does have very  poor oral intake, currently maintained on Levemir 10 units twice a day, NovoLog with meals 4 units 3 times a day and sliding scale.  Blood sugars are erratic.  Oral intake is also not very consistent.  Keeping on regular diet.  Continue above insulin doses, may need to uptitrate once her blood sugars stabilize.  Chronic systolic heart failure: Compensated.  Discontinue diuretics and losartan.  Peripheral vascular disease/peripheral arterial stents: On aspirin.  Not active issue.  History of bipolar disorder/peripheral neuropathy: Patient was on  amitriptyline 150 mg daily, reduced to 75 mg daily.  Tolerating well. Gabapentin 600 mg 3 times a day, reduce to 100 mg 3 times a day. Seroquel 800 mg at night, reduced to 200 mg at night. Venlafaxine 150 mg daily.  Reduced to half dose. Unclear whether patient was on Depakote.  Not using for the last 2 weeks.  Will discontinue. Once he has more mobility, may be able to tolerate more of her antipsychotics and antidepressants. Remains fairly stable at this time. Discontinued all opiates.  Recurrent nausea and vomiting: Improved.  Benign abdomen.  Diabetic gastroparesis or possible withdrawals from TCA.  Phenergan as needed.  Patient remains stable today.  She has multiple issues and is high risk of readmission.  Discharge to a SNF level of care today.  Discharge Diagnoses:  Principal Problem:   Metabolic encephalopathy Active Problems:   Bipolar disorder, unspecified ([place])   DM type 2 (diabetes mellitus, type 2)- poorly controlled   Ulcer of great toe, left, with fat layer exposed ([place])   Essential hypertension   Chronic systolic CHF (congestive heart failure) (HCC)   PVD (peripheral vascular disease) ([place])   Acute kidney injury superimposed on CKD ([place])   Bacteremia due to Escherichia coli   Acute metabolic encephalopathy   Cerebral thrombosis with  cerebral infarction    Discharge Instructions  Discharge Instructions    Diet general   Complete by: As directed    Discharge wound care:   Complete by: As directed    Wound care  Daily      Comments: Wash bilateral feet with soap and water. Thoroughly dry. Place heel foam dressings bilaterally. These can remain in place up to 3 days.   Wound care  Every shift      Comments: Wash the wound on the left instep, plantar surface of the foot with soap and water. Pat dry. Place a small piece of moist gauze into the wound, cover with dry gauze, tape in place.   Increase activity slowly   Complete by: As directed      Allergies as of 01/12/2021      Reactions   Albumin (human)    unknown   Erythromycin Rash   Midecamycin Rash      Medication List    STOP taking these medications   ciprofloxacin 500 MG tablet Commonly known as: CIPRO   divalproex 500 MG DR tablet Commonly known as: DEPAKOTE   doxycycline 100 MG tablet Commonly known as: VIBRA-TABS   Eliquis 2.5 MG Tabs tablet Generic drug: apixaban   furosemide 40 MG tablet Commonly known as: LASIX   gabapentin 600 MG tablet Commonly known as: NEURONTIN Replaced by: gabapentin 100 MG capsule   glipiZIDE 10 MG tablet Commonly known as: GLUCOTROL   HYDROcodone-acetaminophen 5-325 MG tablet Commonly known as: NORCO/VICODIN   hydrOXYzine 50 MG tablet Commonly known as: ATARAX/VISTARIL   INSULIN SYRINGE .5CC/31GX5/16" 31G X 5/16" 0.5 ML Misc   losartan 25 MG tablet Commonly  known as: COZAAR   metFORMIN 1000 MG tablet Commonly known as: GLUCOPHAGE   mirtazapine 15 MG tablet Commonly known as: REMERON   prazosin 5 MG capsule Commonly known as: MINIPRESS   sulfamethoxazole-trimethoprim 800-160 MG tablet Commonly known as: BACTRIM DS   traMADol 50 MG tablet Commonly known as: ULTRAM     TAKE these medications   acetaminophen 325 MG tablet Commonly known as: TYLENOL Take 2 tablets (650 mg total) by mouth  every 6 (six) hours as needed for mild pain (or Fever >/= 101).   amitriptyline 150 MG tablet Commonly known as: ELAVIL Take 0.5 tablets (75 mg total) by mouth at bedtime. What changed: how much to take   aspirin 81 MG EC tablet Take 1 tablet (81 mg total) by mouth daily. Swallow whole. What changed: Another medication with the same name was added. Make sure you understand how and when to take each.   aspirin 325 MG tablet Take 1 tablet (325 mg total) by mouth daily. What changed: You were already taking a medication with the same name, and this prescription was added. Make sure you understand how and when to take each.   atorvastatin 40 MG tablet Commonly known as: LIPITOR Take 40 mg by mouth daily. What changed: Another medication with the same name was removed. Continue taking this medication, and follow the directions you see here.   bethanechol 25 MG tablet Commonly known as: URECHOLINE Take 25 mg by mouth 2 (two) times daily.   clopidogrel 75 MG tablet Commonly known as: Plavix Take 1 tablet (75 mg total) by mouth daily for 21 days.   gabapentin 100 MG capsule Commonly known as: NEURONTIN Take 1 capsule (100 mg total) by mouth 3 (three) times daily. Replaces: gabapentin 600 MG tablet   insulin aspart 100 UNIT/ML injection Commonly known as: novoLOG Inject into the skin 4 (four) times daily -  before meals and at bedtime. Per Sliding Scale   insulin glargine 100 UNIT/ML injection Commonly known as: LANTUS Inject 0.1 mLs (10 Units total) into the skin 2 (two) times daily. What changed:   how much to take  when to take this   magnesium oxide 400 (241.3 Mg) MG tablet Commonly known as: MAG-OX Take 1 tablet (400 mg total) by mouth daily.   metoprolol tartrate 25 MG tablet Commonly known as: LOPRESSOR Take 12.5 mg by mouth 2 (two) times daily.   pantoprazole 20 MG tablet Commonly known as: PROTONIX Take 20 mg by mouth 2 (two) times daily.   promethazine 25 MG  tablet Commonly known as: PHENERGAN Take 25 mg by mouth every 6 (six) hours as needed for nausea or vomiting.   QUEtiapine 200 MG tablet Commonly known as: SEROQUEL Take 1 tablet (200 mg total) by mouth at bedtime. What changed:   medication strength  how much to take   senna-docusate 8.6-50 MG tablet Commonly known as: Senokot-S Take 1 tablet by mouth at bedtime as needed for mild constipation.   spironolactone 25 MG tablet Commonly known as: ALDACTONE Take 25 mg by mouth daily.   tamsulosin 0.4 MG Caps capsule Commonly known as: FLOMAX Take 0.4 mg by mouth at bedtime.   venlafaxine XR 75 MG 24 hr capsule Commonly known as: EFFEXOR-XR Take 1 capsule (75 mg total) by mouth at bedtime. What changed:   medication strength  how much to take            Discharge Care Instructions  (From admission, onward)  Start     Ordered   01/12/21 0000  Discharge wound care:       Comments: Wound care  Daily      Comments: Wash bilateral feet with soap and water. Thoroughly dry. Place heel foam dressings bilaterally. These can remain in place up to 3 days.   Wound care  Every shift      Comments: Wash the wound on the left instep, plantar surface of the foot with soap and water. Pat dry. Place a small piece of moist gauze into the wound, cover with dry gauze, tape in place.   01/12/21 1010          Contact information for follow-up providers    [facility] Follow up in 2 week(s).   Specialty: Family Medicine Contact information: [place] [address] [phone]            Contact information for after-discharge care    Destination    [facility] .   Service: Skilled Nursing Contact information: [address] [place] [address] [phone]                 Allergies  Allergen Reactions  . Albumin (Human)     unknown  . Erythromycin Rash  . Midecamycin Rash     Consultations:  Neurology   Procedures/Studies: MR ANGIO HEAD WO CONTRAST  Result Date: 01/07/2021 CLINICAL DATA:  Stroke follow-up. EXAM: MRA NECK WITHOUT CONTRAST MRA HEAD WITHOUT CONTRAST TECHNIQUE: Angiographic images of the neck were obtained using MRA technique without intravenous contrast; Angiographic images of the Circle of Willis were obtained using MRA technique without intravenous contrast. COMPARISON:  MRI of the brain January 06, 2021. FINDINGS: Motion degraded study. 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 The visualized subclavian arteries are normal. Right carotid system: Normal course and caliber without stenosis or evidence of dissection. Left carotid system: Normal course and caliber without stenosis or evidence of dissection. Vertebral arteries: Right dominant. Vertebral arteries are normal in course and caliber to the vertebrobasilar confluence without stenosis or evidence of dissection. MRA HEAD FINDINGS The visualized portions of the distal cervical and intracranial internal carotid arteries are widely patent with normal flow related enhancement. The bilateral anterior cerebral arteries and middle cerebral arteries are widely patent with antegrade flow without high-grade flow-limiting stenosis or proximal branch occlusion. No intracranial aneurysm within the anterior circulation. The vertebral arteries are widely patent with antegrade flow. Vertebrobasilar junction and basilar artery are widely patent with antegrade flow without evidence of basilar stenosis or aneurysm. Posterior cerebral arteries are normal bilaterally, noting that the right posterior cerebral artery origin is directly from the right ICA (fetal PCA). No intracranial aneurysm within the posterior circulation. IMPRESSION: Normal MRA of the head and neck. Electronically Signed   By: Pedro Earls M.D.   On: 01/07/2021 12:04   MR ANGIO NECK WO  CONTRAST  Result Date: 01/07/2021 CLINICAL DATA:  Stroke follow-up. EXAM: MRA NECK WITHOUT CONTRAST MRA HEAD WITHOUT CONTRAST TECHNIQUE: Angiographic images of the neck were obtained using MRA technique without intravenous contrast; Angiographic images of the Circle of Willis were obtained using MRA technique without intravenous contrast. COMPARISON:  MRI of the brain January 06, 2021. FINDINGS: Motion degraded study. 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 The visualized subclavian arteries are normal. Right carotid system: Normal course and  caliber without stenosis or evidence of dissection. Left carotid system: Normal course and caliber without stenosis or evidence of dissection. Vertebral arteries: Right dominant. Vertebral arteries are normal in course and caliber to the vertebrobasilar confluence without stenosis or evidence of dissection. MRA HEAD FINDINGS The visualized portions of the distal cervical and intracranial internal carotid arteries are widely patent with normal flow related enhancement. The bilateral anterior cerebral arteries and middle cerebral arteries are widely patent with antegrade flow without high-grade flow-limiting stenosis or proximal branch occlusion. No intracranial aneurysm within the anterior circulation. The vertebral arteries are widely patent with antegrade flow. Vertebrobasilar junction and basilar artery are widely patent with antegrade flow without evidence of basilar stenosis or aneurysm. Posterior cerebral arteries are normal bilaterally, noting that the right posterior cerebral artery origin is directly from the right ICA (fetal PCA). No intracranial aneurysm within the posterior circulation. IMPRESSION: Normal MRA of the head and neck. Electronically Signed   By: Pedro Earls M.D.   On: 01/07/2021 12:04   MR BRAIN WO CONTRAST  Result Date: 01/06/2021 CLINICAL DATA:  Mental status change. Unknown  cause of encephalopathy. EXAM: MRI HEAD WITHOUT CONTRAST TECHNIQUE: Multiplanar, multiecho pulse sequences of the brain and surrounding structures were obtained without intravenous contrast. COMPARISON:  Head CT January 02, 2021 FINDINGS: Brain: Foci of restricted diffusion are seen in the bilateral frontal lobes, right parietal and temporal lobe, left occipital lobe and on the left side of the pons, suggestive of embolic infarcts. Remote infarct in the left frontal lobe. No hemorrhage, hydrocephalus, extra-axial collection or mass lesion. Vascular: Normal flow voids. Skull and upper cervical spine: Normal marrow signal. Sinuses/Orbits: Negative. Other: Minimal bilateral mastoid effusion. Remote infarct in the left frontal lobe IMPRESSION: 1. Foci of restricted diffusion in the bilateral frontal lobes, right parietal and temporal lobe, left occipital lobe and left side of the pons, suggestive of embolic acute infarcts. 2. Remote infarct in the left frontal lobe. These results will be called to the ordering clinician or representative by the Radiologist Assistant, and communication documented in the PACS or [employer]. Electronically Signed   By: Pedro Earls M.D.   On: 01/06/2021 10:26   EEG adult  Result Date: 01/04/2021 Lora Havens, MD     01/04/2021 10:35 AM Patient Name: [name] MRN: [id number] Epilepsy Attending: Lora Havens Referring Physician/Provider: Dr. Kerney Elbe Date: 01/04/2021 Duration: 24.13 mins Patient history: 45 year old female with altered mental status.  EEG to evaluate for seizures. Level of alertness: Awake AEDs during EEG study: None Technical aspects: This EEG study was done with scalp electrodes positioned according to the 10-20 International system of electrode placement. Electrical activity was acquired at a sampling rate of '500Hz'$  and reviewed with a high frequency filter of '70Hz'$  and a low frequency filter of '1Hz'$ . EEG data were recorded  continuously and digitally stored. Description: The posterior dominant rhythm consists of 8-9 Hz activity of moderate voltage (25-35 uV) seen predominantly in posterior head regions, symmetric and reactive to eye opening and eye closing. EEG showed intermittent generalized 3 to 6 Hz theta-delta slowing. Hyperventilation and photic stimulation were not performed.   ABNORMALITY -Intermittent slow, generalized IMPRESSION: This study is suggestive of mild diffuse encephalopathy, nonspecific etiology. No seizures or epileptiform discharges were seen throughout the recording. Lora Havens   ECHOCARDIOGRAM COMPLETE  Result Date: 01/06/2021    ECHOCARDIOGRAM REPORT   Patient Name:   [name] Date of Exam: 01/06/2021 Medical Rec #:  [id number]       Height:       69.0 in Accession #:    [id number]      Weight:       213.6 lb Date of Birth:  1976-05-01       BSA:          2.125 m Patient Age:    56 years        BP:           93/60 mmHg Patient Gender: F               HR:           100 bpm. Exam Location:  Inpatient Procedure: 2D Echo, 3D Echo, Cardiac Doppler, Color Doppler and Intracardiac            Opacification Agent Indications:    Barb Merino,  History:        Patient has prior history of Echocardiogram examinations, most                 recent 03/31/2020. CHF; Risk Factors:Hypertension and Diabetes.                 Acute kidney injury superimposed on CKD                 Bacteremia due to Escherichia coli                 Acute metabolic encephalopathy.  Sonographer:    Darlina Sicilian RDCS Referring Phys: [id number] [place]  1. 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 PHT is not accuate in the setting of tachycardia. MVA by VTI 0.6 cm2 consistent with severe stenosis. Severe MR also makes VTI and gradient higher than expected. There is severe mitral regurgitation with pulmonary vein flow reversal in the left upper PV  in systole. A TEE is recommended for better characterization of the MV and associated pathology. The MS and MR appear simliar to prior study 03/31/2020. 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  2. Left ventricular ejection fraction, by estimation, is 45 to 50%. Left ventricular ejection fraction by 3D volume is 45 %. The left ventricle has mildly decreased function. The left ventricle demonstrates global hypokinesis. Left ventricular diastolic  function could not be evaluated.  3. Right ventricular systolic function is low normal. The right ventricular size is mildly enlarged. There is moderately elevated pulmonary artery systolic pressure. The estimated right ventricular systolic pressure is [id number] mmHg.  4. The aortic valve is tricuspid. Aortic valve regurgitation is not visualized. No aortic stenosis is present.  5. The inferior vena cava is dilated in size with <50% respiratory variability, suggesting right atrial pressure of 15 mmHg. Comparison(s): No significant change from prior study. EF ~45%. Moderate to severe MS and severe MR remain. FINDINGS  Left Ventricle: Left ventricular ejection fraction, by estimation, is 45 to 50%. Left ventricular ejection fraction by 3D volume is 45 %. The left ventricle has mildly decreased function. The left ventricle demonstrates global hypokinesis. Definity contrast agent was given IV to delineate the left ventricular endocardial borders. The left ventricular internal cavity size was normal in size. There is no left ventricular hypertrophy. Left ventricular diastolic function could not be evaluated due to mitral stenosis. Left ventricular diastolic function could not be evaluated. Right Ventricle: The right ventricular size is mildly enlarged. No increase in right ventricular wall  thickness. Right ventricular systolic function is low normal. There is moderately elevated pulmonary artery systolic pressure. The tricuspid regurgitant  velocity is 3.03 m/s,  and with an assumed right atrial pressure of 15 mmHg, the estimated right ventricular systolic pressure is [id number] mmHg. Left Atrium: Left atrial size was normal in size. Right Atrium: Right atrial size was normal in size. Pericardium: There is no evidence of pericardial effusion. 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 PHT is not accuate in the setting of tachycardia. MVA by VTI 0.6 cm2 consistent with severe stenosis. Severe MR also makes  VTI and gradient higher than expected. There is severe mitral regurgitation with pulmonary vein flow reversal in the left upper PV in systole. A TEE is recommended for better characterization of the MV and associated pathology. The MS and MR appear simliar to prior study 03/31/2020. The mitral valve is rheumatic. Severe mitral valve regurgitation, with posteriorly-directed jet. MV peak gradient, 30.8 mmHg. The mean mitral valve gradient is 9.3 mmHg with average heart rate of 99 bpm. Tricuspid Valve: The tricuspid valve is grossly normal. Tricuspid valve regurgitation is mild . No evidence of tricuspid stenosis. Aortic Valve: The aortic valve is tricuspid. Aortic valve regurgitation is not visualized. No aortic stenosis is present. Pulmonic Valve: The pulmonic valve was grossly normal. Pulmonic valve regurgitation is not visualized. No evidence of pulmonic stenosis. Aorta: The aortic root and ascending aorta are structurally normal, with no evidence of dilitation. Venous: The inferior vena cava is dilated in size with less than 50% respiratory variability, suggesting right atrial pressure of 15 mmHg. IAS/Shunts: The atrial septum is grossly normal.  LEFT VENTRICLE PLAX 2D LVIDd:         5.40 cm         Diastology LVIDs:         4.20 cm         LV e' medial:    5.96 cm/s LV PW:         1.00 cm         LV E/e' medial:  29.3 LV IVS:        1.00 cm         LV e' lateral:   8.59 cm/s LVOT diam:      1.80 cm         LV E/e' lateral: 20.3 LV SV:         28 LV SV Index:   13 LVOT Area:     2.54 cm        3D Volume EF                                LV 3D EF:    Left                                             ventricular LV Volumes (MOD)                            ejection LV vol d, MOD    161.0 ml                   fraction by A2C:  3D volume LV vol d, MOD    122.0 ml                   is 45 %. A4C: LV vol s, MOD    92.8 ml A2C:                           3D Volume EF: LV vol s, MOD    59.9 ml       3D EF:        45 % A4C:                           LV EDV:       179 ml LV SV MOD A2C:   68.2 ml       LV ESV:       98 ml LV SV MOD A4C:   122.0 ml      LV SV:        81 ml LV SV MOD BP:    66.7 ml RIGHT VENTRICLE RV S prime:     8.76 cm/s TAPSE (M-mode): 0.8 cm LEFT ATRIUM             Index       RIGHT ATRIUM          Index LA diam:        4.60 cm 2.17 cm/m  RA Area:     9.27 cm LA Vol (A2C):   38.0 ml 17.91 ml/m RA Volume:   15.50 ml 7.30 ml/m LA Vol (A4C):   33.1 ml 15.58 ml/m LA Biplane Vol: 33.8 ml 15.91 ml/m  AORTIC VALVE LVOT Vmax:   88.60 cm/s LVOT Vmean:  57.700 cm/s LVOT VTI:    0.110 m  AORTA Ao Root diam: 2.70 cm Ao Asc diam:  2.60 cm MITRAL VALVE                 TRICUSPID VALVE MV Area (PHT): 2.93 cm      TR Peak grad:   36.7 mmHg MV Area VTI:   0.61 cm      TR Vmax:        303.00 cm/s MV Peak grad:  30.8 mmHg MV Mean grad:  9.3 mmHg      SHUNTS MV Vmax:       2.78 m/s      Systemic VTI:  0.11 m MV Vmean:      199.8 cm/s    Systemic Diam: 1.80 cm MV VTI:        0.46 m MV Decel Time: 259 msec MR Peak grad:    91.4 mmHg MR Mean grad:    58.5 mmHg MR Vmax:         478.00 cm/s MR Vmean:        364.0 cm/s MR PISA:         3.53 cm MR PISA Eff ROA: 19 mm MR PISA Radius:  0.75 cm MV E velocity: 174.67 cm/s MV A velocity: 145.00 cm/s MV E/A ratio:  1.20 Eleonore Chiquito MD Electronically signed by Eleonore Chiquito MD Signature Date/Time: 01/06/2021/3:23:18 PM    Final     ECHO TEE  Result Date: 01/08/2021    TRANSESOPHOGEAL ECHO REPORT   Patient Name:   [name] Date of Exam: 01/08/2021 Medical Rec #:  [id number]       Height:  69.0 in Accession #:    [id number]      Weight:       215.4 lb Date of Birth:  24-Apr-1976       BSA:          2.132 m Patient Age:    60 years        BP:           98/67 mmHg Patient Gender: F               HR:           100 bpm. Exam Location:  Inpatient Procedure: Transesophageal Echo, 3D Echo, Cardiac Doppler, Color Doppler and            Saline Contrast Bubble Study Indications:     I34.2 Nonrheumatic mitral (valve) stenosis  History:         Patient has prior history of Echocardiogram examinations, most                  recent 01/06/2021. Stroke and COPD, Mitral Valve Disease and                  Mitral Stenosis; Risk Factors:Diabetes, Hypertension and                  Current Smoker. Mitral stenosis. Heroin overdose. Cocaine use.  Sonographer:     Roseanna Rainbow RDCS Referring Phys:  [id number] Tami Lin [facility] Diagnosing Phys: Kirk Ruths MD PROCEDURE: After discussion of the risks and benefits of a TEE, an informed consent was obtained from the patient. The transesophogeal probe was passed without difficulty through the esophogus of the patient. Imaged were obtained with the patient in a left lateral decubitus position. Sedation performed by different physician. The patient was monitored while under deep sedation. Anesthestetic sedation was provided intravenously by Anesthesiology: '140mg'$  of Propofol. The patient's vital signs; including heart rate, blood pressure, and oxygen saturation; remained stable throughout the procedure. The patient developed no complications during the procedure. IMPRESSIONS  1. Rheumatic MV with moderate MS (mean gradient 8 mmHg; MVA 1.65 cm2 by pressure halftime); severe MR.  2. Left ventricular ejection fraction, by estimation, is 40 to 45%. The left ventricle has mildly decreased function. The left ventricle  demonstrates regional wall motion abnormalities (see scoring diagram/findings for description).  3. Right ventricular systolic function is normal. The right ventricular size is normal.  4. Left atrial size was severely dilated. No left atrial/left atrial appendage thrombus was detected.  5. 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 Moderate mitral stenosis.  6. The aortic valve is normal in structure. Aortic valve regurgitation is not visualized. FINDINGS  Left Ventricle: Left ventricular ejection fraction, by estimation, is 40 to 45%. The left ventricle has mildly decreased function. The left ventricle demonstrates regional wall motion abnormalities. The left ventricular internal cavity size was normal in size. Right Ventricle: The right ventricular size is normal. . Right ventricular systolic function is normal. Left Atrium: Left atrial size was severely dilated. Spontaneous echo contrast was present in the left atrium and left atrial appendage. No left atrial/left atrial appendage thrombus was detected. Right Atrium: Right atrial size was normal in size. Pericardium: There is no evidence of pericardial effusion. 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 Moderate mitral valve stenosis. MV peak gradient, 13.9 mmHg. The mean mitral valve gradient is 8.0 mmHg. Tricuspid Valve: The tricuspid valve is normal in structure. Tricuspid valve regurgitation is mild.  Aortic Valve: The aortic valve is normal in structure. Aortic valve regurgitation is not visualized. Pulmonic Valve: The pulmonic valve was normal in structure. Pulmonic valve regurgitation is trivial. Aorta: The aortic root is normal in size and structure. There is minimal (Grade I) plaque involving the descending aorta. IAS/Shunts: No atrial level shunt detected by color flow Doppler. Agitated saline contrast was given intravenously to evaluate for intracardiac shunting. Additional Comments:  Rheumatic MV with moderate MS (mean gradient 8 mmHg; MVA 1.65 cm2 by pressure halftime); severe MR.  MITRAL VALVE MV Area (PHT): 2.59 cm MV Peak grad:  13.9 mmHg MV Mean grad:  8.0 mmHg MV Vmax:       1.86 m/s MV Vmean:      132.3 cm/s MR Peak grad:    96.8 mmHg MR Mean grad:    64.0 mmHg MR Vmax:         492.00 cm/s MR Vmean:        365.0 cm/s MR PISA:         3.08 cm MR PISA Eff ROA: 41 mm MR PISA Radius:  0.70 cm Kirk Ruths MD Electronically signed by Kirk Ruths MD Signature Date/Time: 01/08/2021/4:10:01 PM    Final    VAS [place] UPPER EXTREMITY VENOUS DUPLEX  Result Date: 01/10/2021 UPPER VENOUS STUDY  Indications: Edema, and PICC line left arm Limitations: Patient positioning, bandages and line. Comparison Study: No prior study on file Performing Technologist: Sharion Dove RVS  Examination Guidelines: A complete evaluation includes B-mode imaging, spectral Doppler, color Doppler, and power Doppler as needed of all accessible portions of each vessel. Bilateral testing is considered an integral part of a complete examination. Limited examinations for reoccurring indications may be performed as noted.  Right Findings: +----------+------------+---------+-----------+----------+--------------+ RIGHT     CompressiblePhasicitySpontaneousProperties   Summary     +----------+------------+---------+-----------+----------+--------------+ IJV                                                 Not visualized +----------+------------+---------+-----------+----------+--------------+ Subclavian    Full       Yes       Yes                             +----------+------------+---------+-----------+----------+--------------+ Axillary      Full       Yes       Yes                             +----------+------------+---------+-----------+----------+--------------+ Brachial      Full       Yes       Yes                              +----------+------------+---------+-----------+----------+--------------+ Radial        Full                                                 +----------+------------+---------+-----------+----------+--------------+ Ulnar  Not visualized +----------+------------+---------+-----------+----------+--------------+ Cephalic      Full                                                 +----------+------------+---------+-----------+----------+--------------+ Basilic       Full                                                 +----------+------------+---------+-----------+----------+--------------+  Left Findings: +----------+------------+---------+-----------+----------+-------+ LEFT      CompressiblePhasicitySpontaneousPropertiesSummary +----------+------------+---------+-----------+----------+-------+ IJV           Full       Yes       Yes                      +----------+------------+---------+-----------+----------+-------+ Subclavian               Yes       Yes                      +----------+------------+---------+-----------+----------+-------+ Axillary                 Yes       Yes               PICC   +----------+------------+---------+-----------+----------+-------+ Brachial      Full       Yes       Yes                      +----------+------------+---------+-----------+----------+-------+ Cephalic      None                                          +----------+------------+---------+-----------+----------+-------+ Basilic       None                                          +----------+------------+---------+-----------+----------+-------+  Summary:  Right: No evidence of deep vein thrombosis in the upper extremity. No evidence of superficial vein thrombosis in the upper extremity.  Left: No evidence of deep vein thrombosis in the upper extremity. Findings consistent with acute superficial vein  thrombosis involving the left cephalic vein and left basilic vein.  *See table(s) above for measurements and observations.  Diagnosing physician: Ruta Hinds MD Electronically signed by Ruta Hinds MD on 01/10/2021 at 11:14:12 AM.    Final    DG FL GUIDED LUMBAR PUNCTURE  Result Date: 01/04/2021 CLINICAL DATA:  Encephalopathy. EXAM: DIAGNOSTIC LUMBAR PUNCTURE UNDER FLUOROSCOPIC GUIDANCE COMPARISON:  None FLUOROSCOPY TIME:  Fluoroscopy Time:  0 minutes and 18 seconds. Radiation Exposure Index (if provided by the fluoroscopic device): 4.50 mGy Number of Acquired Spot Images: 0 PROCEDURE: Informed consent was obtained from the patient's mother prior to the procedure, including potential complications of headache, allergy, and pain. With the patient prone, an appropriate site for lumbar puncture was marked on the patient's skin. The lower back was then prepped with Betadine. 1% Lidocaine was used for local anesthesia. Lumbar puncture was performed at the  L3-4 level using a 20 gauge needle with return of clear CSF with an opening pressure of 20 cm water. 10 ml of CSF were obtained for laboratory studies. The patient tolerated the procedure well and there were no apparent complications. IMPRESSION: Fluoroscopic guided lumbar puncture with 10 cc of clear CSF obtained for appropriate laboratory evaluation. Electronically Signed   By: Marijo Sanes M.D.   On: 01/04/2021 12:18   (Echo, Carotid, EGD, Colonoscopy, ERCP)    Subjective: Patient seen and examined.  No overnight events.  No more nausea or vomiting since yesterday afternoon.  She is alert awake.  She wants to go to nursing home as soon as possible so her mom does not have to drive all the way to [place].  Blood sugars slightly low in the morning but now she is eating regular diet.  Denies any pain.  She cannot recall what all medications he takes at home.   Discharge Exam: Vitals:   01/12/21 0809 01/12/21 1031  BP: (!) 108/55 97/75  Pulse:  (!) 118 (!) 108  Resp: 18   Temp: 98.3 F (36.8 C)   SpO2: 100%    Vitals:   01/12/21 0439 01/12/21 0500 01/12/21 0809 01/12/21 1031  BP: 106/64  (!) 108/55 97/75  Pulse: (!) 104  (!) 118 (!) 108  Resp: 18  18   Temp: 98.4 F (36.9 C)  98.3 F (36.8 C)   TempSrc: Oral  Oral   SpO2: 99%  100%   Weight:  104.2 kg    Height:        General: Pt is alert, awake, not in acute distress Frail and debilitated.  Chronically sick looking. 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 Patient has an unstageable ulcer on the left plantar aspect, nontender. Generalized weakness but no focal deficits.    The results of significant diagnostics from this hospitalization (including imaging, microbiology, ancillary and laboratory) are listed below for reference.     Microbiology: Recent Results (from the past 240 hour(s))  CSF culture w Gram Stain     Status: None   Collection Time: 01/04/21 11:07 AM   Specimen: PATH Cytology CSF; Cerebrospinal Fluid  Result Value Ref Range Status   Specimen Description CSF  Final   Special Requests NONE  Final   Gram Stain   Final    WBC PRESENT, PREDOMINANTLY MONONUCLEAR NO ORGANISMS SEEN CYTOSPIN SMEAR    Culture   Final    NO GROWTH 3 DAYS Performed at [facility], [address], [place], [place] [address]    Report Status 01/07/2021 FINAL  Final  Anaerobic culture w Gram Stain     Status: None   Collection Time: 01/04/21 11:07 AM   Specimen: PATH Cytology CSF; Cerebrospinal Fluid  Result Value Ref Range Status   Specimen Description CSF  Final   Special Requests NONE  Final   Culture   Final    NO ANAEROBES ISOLATED Performed at [facility], [place] [address], [place], [place] [address]    Report Status 01/09/2021 FINAL  Final  SARS  CORONAVIRUS 2 (TAT 6-24 HRS) Nasopharyngeal Nasopharyngeal Swab     Status: None   Collection Time: 01/11/21 10:28 AM   Specimen: Nasopharyngeal Swab  Result Value Ref Range Status   SARS Coronavirus 2 NEGATIVE  NEGATIVE Final    Comment: (NOTE) SARS-CoV-2 target nucleic acids are NOT DETECTED.  The SARS-CoV-2 RNA is generally detectable in upper and lower respiratory specimens during the acute phase of infection. Negative results do not preclude SARS-CoV-2 infection, do not rule out co-infections with other pathogens, and should not be used as the sole basis for treatment or other patient management decisions. Negative results must be combined with clinical observations, patient history, and epidemiological information. The expected result is Negative.  Fact Sheet for Patients: SugarRoll.be  Fact Sheet for Healthcare Providers: https://www.woods-mathews.com/  This test is not yet approved or cleared by the [place] FDA and  has been authorized for detection and/or diagnosis of SARS-CoV-2 by FDA under an Emergency Use Authorization (EUA). This EUA will remain  in effect (meaning this test can be used) for the duration of the COVID-19 declaration under Se ction 564(b)(1) of the Act, 21 U.S.C. section 360bbb-3(b)(1), unless the authorization is terminated or revoked sooner.  Performed at [facility], [place] [address], [place], [place] [address]      Labs: BNP (last 3 results) No results for input(s): BNP in the last 8760 hours. Basic Metabolic Panel: Recent Labs  Lab 01/07/21 0555 01/08/21 0500 01/09/21 0600 01/10/21 0325 01/11/21 1329  NA 140 139 141 137 136  K 4.0 4.4 4.0 4.5 5.4*  CL 116* 115* 117* 107 108  CO2 19* 19* 20* 21* 23  GLUCOSE 79 119* 54* 202* 167*  BUN 31* 31* 28* 27* 26*  CREATININE 2.36* 2.27* 2.23* 2.20* 2.02*  CALCIUM 7.8* 7.9* 8.5* 8.6* 8.3*  MG  --   --  1.8  --  1.5*  PHOS  --   --  4.0  --   3.3   Liver Function Tests: Recent Labs  Lab 01/06/21 0525 01/07/21 0555  AST 11* 9*  ALT 10 8  ALKPHOS 71 62  BILITOT 0.7 0.7  PROT 5.5* 5.1*  ALBUMIN 1.6* 1.4*   No results for input(s): LIPASE, AMYLASE in the last 168 hours. Recent Labs  Lab 01/05/21 1300  AMMONIA 20   CBC: Recent Labs  Lab 01/08/21 0500 01/09/21 0600 01/10/21 0325 01/11/21 1412 01/12/21 0404  WBC 9.1 11.9* 8.9 8.3 7.8  NEUTROABS 6.8 10.1* 7.3 5.8 4.4  HGB 7.8* 9.0* 9.3* 8.5* 8.3*  HCT 25.2* 28.6* 30.0* 25.6* 26.1*  MCV 87.5 86.4 87.2 84.2 87.0  PLT 232 340 424* 378 370   Cardiac Enzymes: No results for input(s): CKTOTAL, CKMB, CKMBINDEX, TROPONINI in the last 168 hours. BNP: Invalid input(s): POCBNP CBG: Recent Labs  Lab 01/11/21 0512 01/11/21 0701 01/11/21 1153 01/11/21 1556 01/12/21 0619  GLUCAP 113* 116* 162* 253* 76   D-Dimer No results for input(s): DDIMER in the last 72 hours. Hgb A1c No results for input(s): HGBA1C in the last 72 hours. Lipid Profile No results for input(s): CHOL, HDL, LDLCALC, TRIG, CHOLHDL, LDLDIRECT in the last 72 hours. Thyroid function studies No results for input(s): TSH, T4TOTAL, T3FREE, THYROIDAB in the last 72 hours.  Invalid input(s): FREET3 Anemia work up No results for input(s): VITAMINB12, FOLATE, FERRITIN, TIBC, IRON, RETICCTPCT in the last 72 hours. Urinalysis    Component Value Date/Time   COLORURINE YELLOW (A) 08/15/2019 1931   APPEARANCEUR CLEAR (A) 08/15/2019 1931   LABSPEC 1.015 08/15/2019 1931   PHURINE 5.0 08/15/2019 1931   GLUCOSEU >=500 (A) 08/15/2019 1931   HGBUR NEGATIVE 08/15/2019 [place] NEGATIVE 08/15/2019 [place] NEGATIVE 08/15/2019 1931  PROTEINUR NEGATIVE 08/15/2019 1931   UROBILINOGEN 0.2 04/29/2014 0001   NITRITE NEGATIVE 08/15/2019 1931   LEUKOCYTESUR TRACE (A) 08/15/2019 1931   Sepsis Labs Invalid input(s): PROCALCITONIN,  WBC,  LACTICIDVEN Microbiology Recent Results (from the past 240  hour(s))  CSF culture w Gram Stain     Status: None   Collection Time: 01/04/21 11:07 AM   Specimen: PATH Cytology CSF; Cerebrospinal Fluid  Result Value Ref Range Status   Specimen Description CSF  Final   Special Requests NONE  Final   Gram Stain   Final    WBC PRESENT, PREDOMINANTLY MONONUCLEAR NO ORGANISMS SEEN CYTOSPIN SMEAR    Culture   Final    NO GROWTH 3 DAYS Performed at [facility], [place] [address] [place], [place] [address]    Report Status 01/07/2021 FINAL  Final  Anaerobic culture w Gram Stain     Status: None   Collection Time: 01/04/21 11:07 AM   Specimen: PATH Cytology CSF; Cerebrospinal Fluid  Result Value Ref Range Status   Specimen Description CSF  Final   Special Requests NONE  Final   Culture   Final    NO ANAEROBES ISOLATED Performed at [facility], [place] [address] [place], [place] [address]    Report Status 01/09/2021 FINAL  Final  SARS CORONAVIRUS 2 (TAT 6-24 HRS) Nasopharyngeal Nasopharyngeal Swab     Status: None   Collection Time: 01/11/21 10:28 AM   Specimen: Nasopharyngeal Swab  Result Value Ref Range Status   SARS Coronavirus 2 NEGATIVE NEGATIVE Final    Comment: (NOTE) SARS-CoV-2 target nucleic acids are NOT DETECTED.  The SARS-CoV-2 RNA is generally detectable in upper and lower respiratory specimens during the acute phase of infection. Negative results do not preclude SARS-CoV-2 infection, do not rule out co-infections with other pathogens, and should not be used as the sole basis for treatment or other patient management decisions. Negative results must be combined with clinical observations, patient history, and epidemiological information. The expected result is Negative.  Fact Sheet for Patients: SugarRoll.be  Fact Sheet for Healthcare Providers: https://www.woods-mathews.com/  This test is not yet approved or cleared by the [place] FDA and  has been authorized for  detection and/or diagnosis of SARS-CoV-2 by FDA under an Emergency Use Authorization (EUA). This EUA will remain  in effect (meaning this test can be used) for the duration of the COVID-19 declaration under Se ction 564(b)(1) of the Act, 21 U.S.C. section 360bbb-3(b)(1), unless the authorization is terminated or revoked sooner.  Performed at [facility], [place] [address] [place], [place] [address]      Time coordinating discharge:  40 minutes  SIGNED:   Barb Merino, MD  [facility] 01/12/2021, 11:05 AM

## 2021-01-12 NOTE — Progress Notes (Signed)
Nutrition Follow-up  DOCUMENTATION CODES:   Obesity unspecified  INTERVENTION:  -D/c calorie count -D/c Boost breeze due to elevated CBGs and improved intake -Snacks TID -Continue Magic cup TID with meals, each supplement provides 290 kcal and 9 grams of protein -Continue MVI with minerals  NUTRITION DIAGNOSIS:   Inadequate oral intake related to lethargy/confusion as evidenced by meal completion < 50%.  progressing  GOAL:   Patient will meet greater than or equal to 90% of their needs  progressing  MONITOR:   PO intake,Supplement acceptance,Diet advancement,Skin,Weight trends,Labs,I & O's  REASON FOR ASSESSMENT:   New TF    ASSESSMENT:   Pt admitted with acute multifactorial encephalopathy. PMH includes CHF, type 2 DM, PVD, HTN, COPD, GERD, seizure disorder, bipolar disorder, h/o substance abuse, sensory neuropathy. (Pt admitted with acute multifactorial encephalopathy. PMH includes CHF, type 2 DM, PVD, HTN, COPD, GERD, seizure disorder, bipolar disorder, h/o substance abuse, sensory neuropathy.)  Calorie count was extended throughout the weekend, though pt had very limited intake. Pt did not eat any of her breakfast or lunch trays on 3/19, but did consume some of her dinner tray (provided 199 kcals and 5g protein). Pt also consumed one applesauce and a Boost Breeze later on in the evening, providing an additional 320 kcals and 9g protein. This means pt was only able to meet ~25% of her minimum kcal needs and ~13% of her minimum protein needs PO on 3/19. Pt's meal intake continued to be poor on 3/20. Pt did not eat anything off of her meal trays except for 50% of the macaroni and cheese that came with lunch (98 kcals, 4g protein). She also ate 100% of the Magic Cup that came on that lunch tray (290kcal, 9g protein). Otherwise, the remaining kcals/protein the pt consumed throughout the day came from several cartons of 2% milk and 1 applesauce. On 3/20, pt consumed a total of  1298 kcals (meets ~62% of minimum estimated needs) and 69g protein (meets ~66% minimum estimated needs).   Pt's intake has since improved with both breakfast and lunch yesterday being documented as 100% meal completion. Will discontinue calorie count and adjust supplement/snack regimen. Pt with orders for Boost Breeze TID but is intermittently refusing and has been experiencing elevated CBGs. Pt did well with orange Magic Cup only, so will transition all three to this flavor. Note pt did very well with milk, so will order as a snack TID in place of Boost.  UOP: 1273m x24 hours  Medications: SSI, lantus BID, reglan, MVI with minerals, miralax, IV Mg sulfate 2g x1 Labs: K+ 5.4 (H), Mg 1.5 (L) CBGs 76-253  Diet Order:   Diet Order            Diet general           Diet regular Room service appropriate? Yes; Fluid consistency: Thin  Diet effective now                 EDUCATION NEEDS:   Not appropriate for education at this time  Skin:  Skin Assessment: Skin Integrity Issues: Skin Integrity Issues:: Stage II,Other (Comment) Stage II: bilateral heels Other: MASD perineum  Last BM:  3/20 type 7  Height:   Ht Readings from Last 1 Encounters:  01/04/21 '5\' 9"'$  (1.753 m)    Weight:   Wt Readings from Last 1 Encounters:  01/12/21 104.2 kg    Ideal Body Weight:  65.9 kg  BMI:  Body mass index is 33.92 kg/m.  Estimated  Nutritional Needs:   Kcal:  2100-2300  Protein:  105-130 grams  Fluid:  >2L    Larkin Ina, MS, RD, LDN RD pager number and weekend/on-call pager number located in [place].

## 2021-01-12 NOTE — Progress Notes (Signed)
Pt has been discharged from the unit iva PTAR. All belongings has been sent with the pt. All IV has has disconnected.

## 2021-01-12 NOTE — TOC Transition Note (Signed)
N[facility]) - CM/SW Discharge Note   Patient Details  Name: [name] MRN: [id number] Date of Birth: May 25, 1976  Transition of Care [facility]) CM/SW Contact:  [name], [place] Work Phone Number: 01/12/2021, 10:33 AM   Clinical Narrative:   Nurse to call report to [phone]. Rm. [phone]    Final next level of care: [place] Barriers to Discharge: No Barriers Identified   Patient Goals and CMS Choice Patient states their goals for this hospitalization and ongoing recovery are:: Unable to assess CMS Medicare.gov Compare Post Acute Care list provided to:: Patient Represenative (must comment) Choice offered to / list presented to : Parent  Discharge Placement              Patient chooses bed at: [facility] Patient to be transferred to facility by: [place] Name of family member notified: Cindy Patient and family notified of of transfer: 01/12/21  Discharge Plan and Services     Post Acute Care Choice: [place]                               Social Determinants of Health (SDOH) Interventions     Readmission Risk Interventions No flowsheet data found.

## 2021-01-12 NOTE — Discharge Summary (Signed)
Physician Discharge Summary  ZENAYDA SCHLICHTE [id number] DOB: [birth year] DOA: 01/04/2021  PCP: [facility]  Admit date: 01/04/2021 Discharge date: 01/12/2021  Admitted From: Outside hospital.  Admitted from home. Disposition: Skilled nursing facility.  Recommendations for Outpatient Follow-up:  1. Follow up with PCP in 1-2 weeks 2. Please obtain BMP/CBC/magnesium/phosphorus in one week   Home Health: Not applicable Equipment/Devices: Not applicable  Discharge Condition: Stable CODE STATUS: Full code Diet recommendation: Regular diet until oral intake improves and adequate, then go back on carb controlled diet.  Discharge summary: Patient with complicated medical history and extensive hospitalization.  As below. 45 year old female with extensive medical problems including chronic combined heart failure, type 2 diabetes on insulin, hypertension, COPD, GERD, seizure disorder, bipolar disorder, history of substance abuse, sensory neuropathy who was admitted to [facility] since 3/8 where she presented with confusion of about 1 day.  Patient was unable to provide any history.  Her mother who lives with her able to give history that she was at her usual self 2 days prior to admission.  At the outside hospital, she was found to be in HHS with blood sugars more than 1200.  Treated with insulin drip then subsequently changed to subcu insulin with no improvement of mentation.  She started spiking fever, blood culture reportedly with E. coli, urine culture not reported.  Treated with ceftriaxone.  With persistent encephalopathy despite treatment she was transferred to [facility] with neurology consultation. Mental status improving.  MRI did show multiple embolic stroke.  Patient is still in the hospital with multiple issues and now stabilizing to transfer to a skilled nursing facility.   Assessment & plan of care discussed as bullet points below:   Acute metabolic  encephalopathy: Multifactorial encephalopathy.   HSS corrected but mental status did not correct hence transferred to [facility]. Her mental status is gradually improving and returning to normal.  Suspected prolonged delirium. Acute bacterial meningitis ruled out.  HSV negative. All-time fall precautions.  Delirium precautions.  After much titration of her multiple medications including antidepressants and Seroquel, her mental status has much improved now.  Acute multifocal cardioembolic stroke: Rule out infective endocarditis/infective emboli. MRI 3/16 shows acute multifocal cardioembolic stroke.  Patient with history of peripheral vascular disease, previously used to be on Eliquis, was taken off.  May have underlying cardioembolic source. Currently neurologically improving.  No focal deficits except generalized weakness. 2D echocardiogram with severe valvular heart disease but no evidence of thrombosis or infection. MRA of the head and neck with no obvious large vessel occlusion. On aspirin and statin. TEE with no evidence of thromboembolism.  No evidence of bacterial vegetation. Neurology recommended aspirin Plavix for 3 weeks then aspirin alone.  Suspected meningo-encephalitis: Ruled out.  E. coli bacteremia: Blood cultures 3/8 , urine cultures 3/8 outside hospital with E. coli pansensitive.  Repeat cultures negative.  CT scan of the abdomen pelvis without any evidence of hydronephrosis or obstruction. Continue Rocephin for total 10 days.  Completed antibiotic therapy.  Repeat blood cultures negative.  Acute kidney injury with history of chronic kidney disease stage IIIa: Baseline creatinine about 1.4.  Gradually improving.  Urine output is improving.  Levels are improving. Off maintenance IV fluids.  Encourage oral intake.  Recheck in 1 week at nursing home.  Hyperchloremic hypernatremia: Normalized.  Type 2 diabetes with HHS: Blood sugars fairly controlled.  Hemoglobin A1c is 14.    Continue current doses of insulin until oral intake stabilizes. Patient does have very  poor oral intake, currently maintained on Levemir 10 units twice a day, NovoLog with meals 4 units 3 times a day and sliding scale.  Blood sugars are erratic.  Oral intake is also not very consistent.  Keeping on regular diet.  Continue above insulin doses, may need to uptitrate once her blood sugars stabilize.  Chronic systolic heart failure: Compensated.  Discontinue diuretics and losartan.  Peripheral vascular disease/peripheral arterial stents: On aspirin.  Not active issue.  History of bipolar disorder/peripheral neuropathy: Patient was on  amitriptyline 150 mg daily, reduced to 75 mg daily.  Tolerating well. Gabapentin 600 mg 3 times a day, reduce to 100 mg 3 times a day. Seroquel 800 mg at night, reduced to 200 mg at night. Venlafaxine 150 mg daily.  Reduced to half dose. Unclear whether patient was on Depakote.  Not using for the last 2 weeks.  Will discontinue. Once he has more mobility, may be able to tolerate more of her antipsychotics and antidepressants. Remains fairly stable at this time. Discontinued all opiates.  Recurrent nausea and vomiting: Improved.  Benign abdomen.  Diabetic gastroparesis or possible withdrawals from TCA.  Phenergan as needed.  Patient remains stable today.  She has multiple issues and is high risk of readmission.  Discharge to a SNF level of care today.  Discharge Diagnoses:  Principal Problem:   Metabolic encephalopathy Active Problems:   Bipolar disorder, unspecified ([place])   DM type 2 (diabetes mellitus, type 2)- poorly controlled   Ulcer of great toe, left, with fat layer exposed ([place])   Essential hypertension   Chronic systolic CHF (congestive heart failure) (HCC)   PVD (peripheral vascular disease) ([place])   Acute kidney injury superimposed on CKD ([place])   Bacteremia due to Escherichia coli   Acute metabolic encephalopathy   Cerebral thrombosis with  cerebral infarction    Discharge Instructions  Discharge Instructions    Diet general   Complete by: As directed    Discharge wound care:   Complete by: As directed    Wound care  Daily      Comments: Wash bilateral feet with soap and water. Thoroughly dry. Place heel foam dressings bilaterally. These can remain in place up to 3 days.   Wound care  Every shift      Comments: Wash the wound on the left instep, plantar surface of the foot with soap and water. Pat dry. Place a small piece of moist gauze into the wound, cover with dry gauze, tape in place.   Increase activity slowly   Complete by: As directed      Allergies as of 01/12/2021      Reactions   Albumin (human)    unknown   Erythromycin Rash   Midecamycin Rash      Medication List    STOP taking these medications   aspirin 81 MG EC tablet Replaced by: aspirin 325 MG tablet   ciprofloxacin 500 MG tablet Commonly known as: CIPRO   divalproex 500 MG DR tablet Commonly known as: DEPAKOTE   doxycycline 100 MG tablet Commonly known as: VIBRA-TABS   Eliquis 2.5 MG Tabs tablet Generic drug: apixaban   furosemide 40 MG tablet Commonly known as: LASIX   gabapentin 600 MG tablet Commonly known as: NEURONTIN Replaced by: gabapentin 100 MG capsule   glipiZIDE 10 MG tablet Commonly known as: GLUCOTROL   HYDROcodone-acetaminophen 5-325 MG tablet Commonly known as: NORCO/VICODIN   hydrOXYzine 50 MG tablet Commonly known as: ATARAX/VISTARIL   INSULIN SYRINGE .5CC/31GX5/16"  31G X 5/16" 0.5 ML Misc   losartan 25 MG tablet Commonly known as: COZAAR   metFORMIN 1000 MG tablet Commonly known as: GLUCOPHAGE   mirtazapine 15 MG tablet Commonly known as: REMERON   prazosin 5 MG capsule Commonly known as: MINIPRESS   sulfamethoxazole-trimethoprim 800-160 MG tablet Commonly known as: BACTRIM DS   traMADol 50 MG tablet Commonly known as: ULTRAM     TAKE these medications   acetaminophen 325 MG  tablet Commonly known as: TYLENOL Take 2 tablets (650 mg total) by mouth every 6 (six) hours as needed for mild pain (or Fever >/= 101).   amitriptyline 150 MG tablet Commonly known as: ELAVIL Take 0.5 tablets (75 mg total) by mouth at bedtime. What changed: how much to take   aspirin 325 MG tablet Take 1 tablet (325 mg total) by mouth daily. Replaces: aspirin 81 MG EC tablet   atorvastatin 40 MG tablet Commonly known as: LIPITOR Take 40 mg by mouth daily. What changed: Another medication with the same name was removed. Continue taking this medication, and follow the directions you see here.   bethanechol 25 MG tablet Commonly known as: URECHOLINE Take 25 mg by mouth 2 (two) times daily.   clopidogrel 75 MG tablet Commonly known as: Plavix Take 1 tablet (75 mg total) by mouth daily for 21 days.   gabapentin 100 MG capsule Commonly known as: NEURONTIN Take 1 capsule (100 mg total) by mouth 3 (three) times daily. Replaces: gabapentin 600 MG tablet   insulin aspart 100 UNIT/ML injection Commonly known as: novoLOG Inject into the skin 4 (four) times daily -  before meals and at bedtime. Per Sliding Scale   insulin glargine 100 UNIT/ML injection Commonly known as: LANTUS Inject 0.1 mLs (10 Units total) into the skin 2 (two) times daily. What changed:   how much to take  when to take this   metoprolol tartrate 25 MG tablet Commonly known as: LOPRESSOR Take 12.5 mg by mouth 2 (two) times daily.   pantoprazole 20 MG tablet Commonly known as: PROTONIX Take 20 mg by mouth 2 (two) times daily.   promethazine 25 MG tablet Commonly known as: PHENERGAN Take 25 mg by mouth every 6 (six) hours as needed for nausea or vomiting.   QUEtiapine 200 MG tablet Commonly known as: SEROQUEL Take 2 tablets (400 mg total) by mouth at bedtime. What changed: medication strength   senna-docusate 8.6-50 MG tablet Commonly known as: Senokot-S Take 1 tablet by mouth at bedtime as needed  for mild constipation.   spironolactone 25 MG tablet Commonly known as: ALDACTONE Take 25 mg by mouth daily.   tamsulosin 0.4 MG Caps capsule Commonly known as: FLOMAX Take 0.4 mg by mouth at bedtime.   venlafaxine XR 75 MG 24 hr capsule Commonly known as: EFFEXOR-XR Take 4 capsules (300 mg total) by mouth at bedtime. What changed: medication strength            Discharge Care Instructions  (From admission, onward)         Start     Ordered   01/12/21 0000  Discharge wound care:       Comments: Wound care  Daily      Comments: Wash bilateral feet with soap and water. Thoroughly dry. Place heel foam dressings bilaterally. These can remain in place up to 3 days.   Wound care  Every shift      Comments: Wash the wound on the left instep, plantar surface of the  foot with soap and water. Pat dry. Place a small piece of moist gauze into the wound, cover with dry gauze, tape in place.   01/12/21 1010          Follow-up Information    [facility] Follow up in 2 week(s).   Specialty: Family Medicine Contact information: [address] [place] [address] [phone]              Allergies  Allergen Reactions  . Albumin (Human)     unknown  . Erythromycin Rash  . Midecamycin Rash    Consultations:  Neurology   Procedures/Studies: MR ANGIO HEAD WO CONTRAST  Result Date: 01/07/2021 CLINICAL DATA:  Stroke follow-up. EXAM: MRA NECK WITHOUT CONTRAST MRA HEAD WITHOUT CONTRAST TECHNIQUE: Angiographic images of the neck were obtained using MRA technique without intravenous contrast; Angiographic images of the Circle of Willis were obtained using MRA technique without intravenous contrast. COMPARISON:  MRI of the brain January 06, 2021. FINDINGS: Motion degraded study. 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 The visualized subclavian arteries are normal. Right carotid system: Normal course and  caliber without stenosis or evidence of dissection. Left carotid system: Normal course and caliber without stenosis or evidence of dissection. Vertebral arteries: Right dominant. Vertebral arteries are normal in course and caliber to the vertebrobasilar confluence without stenosis or evidence of dissection. MRA HEAD FINDINGS The visualized portions of the distal cervical and intracranial internal carotid arteries are widely patent with normal flow related enhancement. The bilateral anterior cerebral arteries and middle cerebral arteries are widely patent with antegrade flow without high-grade flow-limiting stenosis or proximal branch occlusion. No intracranial aneurysm within the anterior circulation. The vertebral arteries are widely patent with antegrade flow. Vertebrobasilar junction and basilar artery are widely patent with antegrade flow without evidence of basilar stenosis or aneurysm. Posterior cerebral arteries are normal bilaterally, noting that the right posterior cerebral artery origin is directly from the right ICA (fetal PCA). No intracranial aneurysm within the posterior circulation. IMPRESSION: Normal MRA of the head and neck. Electronically Signed   By: Pedro Earls M.D.   On: 01/07/2021 12:04   MR ANGIO NECK WO CONTRAST  Result Date: 01/07/2021 CLINICAL DATA:  Stroke follow-up. EXAM: MRA NECK WITHOUT CONTRAST MRA HEAD WITHOUT CONTRAST TECHNIQUE: Angiographic images of the neck were obtained using MRA technique without intravenous contrast; Angiographic images of the Circle of Willis were obtained using MRA technique without intravenous contrast. COMPARISON:  MRI of the brain January 06, 2021. FINDINGS: Motion degraded study. 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 The visualized subclavian arteries are normal. Right carotid system: Normal course and caliber without stenosis or evidence of dissection. Left carotid system:  Normal course and caliber without stenosis or evidence of dissection. Vertebral arteries: Right dominant. Vertebral arteries are normal in course and caliber to the vertebrobasilar confluence without stenosis or evidence of dissection. MRA HEAD FINDINGS The visualized portions of the distal cervical and intracranial internal carotid arteries are widely patent with normal flow related enhancement. The bilateral anterior cerebral arteries and middle cerebral arteries are widely patent with antegrade flow without high-grade flow-limiting stenosis or proximal branch occlusion. No intracranial aneurysm within the anterior circulation. The vertebral arteries are widely patent with antegrade flow. Vertebrobasilar junction and basilar artery are widely patent with antegrade flow without evidence of basilar stenosis or aneurysm. Posterior cerebral arteries are normal bilaterally,  noting that the right posterior cerebral artery origin is directly from the right ICA (fetal PCA). No intracranial aneurysm within the posterior circulation. IMPRESSION: Normal MRA of the head and neck. Electronically Signed   By: Pedro Earls M.D.   On: 01/07/2021 12:04   MR BRAIN WO CONTRAST  Result Date: 01/06/2021 CLINICAL DATA:  Mental status change. Unknown cause of encephalopathy. EXAM: MRI HEAD WITHOUT CONTRAST TECHNIQUE: Multiplanar, multiecho pulse sequences of the brain and surrounding structures were obtained without intravenous contrast. COMPARISON:  Head CT January 02, 2021 FINDINGS: Brain: Foci of restricted diffusion are seen in the bilateral frontal lobes, right parietal and temporal lobe, left occipital lobe and on the left side of the pons, suggestive of embolic infarcts. Remote infarct in the left frontal lobe. No hemorrhage, hydrocephalus, extra-axial collection or mass lesion. Vascular: Normal flow voids. Skull and upper cervical spine: Normal marrow signal. Sinuses/Orbits: Negative. Other: Minimal bilateral  mastoid effusion. Remote infarct in the left frontal lobe IMPRESSION: 1. Foci of restricted diffusion in the bilateral frontal lobes, right parietal and temporal lobe, left occipital lobe and left side of the pons, suggestive of embolic acute infarcts. 2. Remote infarct in the left frontal lobe. These results will be called to the ordering clinician or representative by the Radiologist Assistant, and communication documented in the PACS or [employer]. Electronically Signed   By: Pedro Earls M.D.   On: 01/06/2021 10:26   EEG adult  Result Date: 01/04/2021 Lora Havens, MD     01/04/2021 10:35 AM Patient Name: NASREEN GARRAND MRN: [id number] Epilepsy Attending: Lora Havens Referring Physician/Provider: Dr. Kerney Elbe Date: 01/04/2021 Duration: 24.13 mins Patient history: 45 year old female with altered mental status.  EEG to evaluate for seizures. Level of alertness: Awake AEDs during EEG study: None Technical aspects: This EEG study was done with scalp electrodes positioned according to the 10-20 International system of electrode placement. Electrical activity was acquired at a sampling rate of '500Hz'$  and reviewed with a high frequency filter of '70Hz'$  and a low frequency filter of '1Hz'$ . EEG data were recorded continuously and digitally stored. Description: The posterior dominant rhythm consists of 8-9 Hz activity of moderate voltage (25-35 uV) seen predominantly in posterior head regions, symmetric and reactive to eye opening and eye closing. EEG showed intermittent generalized 3 to 6 Hz theta-delta slowing. Hyperventilation and photic stimulation were not performed.   ABNORMALITY -Intermittent slow, generalized IMPRESSION: This study is suggestive of mild diffuse encephalopathy, nonspecific etiology. No seizures or epileptiform discharges were seen throughout the recording. Lora Havens   ECHOCARDIOGRAM COMPLETE  Result Date: 01/06/2021    ECHOCARDIOGRAM REPORT   Patient  Name:   DEYLANI PREDDY Date of Exam: 01/06/2021 Medical Rec #:  [id number]       Height:       69.0 in Accession #:    [id number]      Weight:       213.6 lb Date of Birth:  06-Mar-1976       BSA:          2.125 m Patient Age:    25 years        BP:           93/60 mmHg Patient Gender: F               HR:           100 bpm. Exam Location:  Inpatient Procedure: 2D Echo, 3D Echo, Cardiac  Doppler, Color Doppler and Intracardiac            Opacification Agent Indications:    Barb Merino,  History:        Patient has prior history of Echocardiogram examinations, most                 recent 03/31/2020. CHF; Risk Factors:Hypertension and Diabetes.                 Acute kidney injury superimposed on CKD                 Bacteremia due to Escherichia coli                 Acute metabolic encephalopathy.  Sonographer:    Darlina Sicilian RDCS Referring Phys: [id number] [place]  1. 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 PHT is not accuate in the setting of tachycardia. MVA by VTI 0.6 cm2 consistent with severe stenosis. Severe MR also makes VTI and gradient higher than expected. There is severe mitral regurgitation with pulmonary vein flow reversal in the left upper PV in systole. A TEE is recommended for better characterization of the MV and associated pathology. The MS and MR appear simliar to prior study 03/31/2020. 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  2. Left ventricular ejection fraction, by estimation, is 45 to 50%. Left ventricular ejection fraction by 3D volume is 45 %. The left ventricle has mildly decreased function. The left ventricle demonstrates global hypokinesis. Left ventricular diastolic  function could not be evaluated.  3. Right ventricular systolic function is low normal. The right ventricular size is mildly enlarged. There is moderately elevated pulmonary artery systolic pressure. The  estimated right ventricular systolic pressure is [id number] mmHg.  4. The aortic valve is tricuspid. Aortic valve regurgitation is not visualized. No aortic stenosis is present.  5. The inferior vena cava is dilated in size with <50% respiratory variability, suggesting right atrial pressure of 15 mmHg. Comparison(s): No significant change from prior study. EF ~45%. Moderate to severe MS and severe MR remain. FINDINGS  Left Ventricle: Left ventricular ejection fraction, by estimation, is 45 to 50%. Left ventricular ejection fraction by 3D volume is 45 %. The left ventricle has mildly decreased function. The left ventricle demonstrates global hypokinesis. Definity contrast agent was given IV to delineate the left ventricular endocardial borders. The left ventricular internal cavity size was normal in size. There is no left ventricular hypertrophy. Left ventricular diastolic function could not be evaluated due to mitral stenosis. Left ventricular diastolic function could not be evaluated. Right Ventricle: The right ventricular size is mildly enlarged. No increase in right ventricular wall thickness. Right ventricular systolic function is low normal. There is moderately elevated pulmonary artery systolic pressure. The tricuspid regurgitant  velocity is 3.03 m/s, and with an assumed right atrial pressure of 15 mmHg, the estimated right ventricular systolic pressure is [id number] mmHg. Left Atrium: Left atrial size was normal in size. Right Atrium: Right atrial size was normal in size. Pericardium: There is no evidence of pericardial effusion. 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 PHT is not accuate in the setting of tachycardia. MVA by VTI 0.6 cm2 consistent with severe stenosis. Severe MR also makes  VTI and gradient higher than expected. There is severe mitral regurgitation with  pulmonary vein flow reversal in the left upper PV in  systole. A TEE is recommended for better characterization of the MV and associated pathology. The MS and MR appear simliar to prior study 03/31/2020. The mitral valve is rheumatic. Severe mitral valve regurgitation, with posteriorly-directed jet. MV peak gradient, 30.8 mmHg. The mean mitral valve gradient is 9.3 mmHg with average heart rate of 99 bpm. Tricuspid Valve: The tricuspid valve is grossly normal. Tricuspid valve regurgitation is mild . No evidence of tricuspid stenosis. Aortic Valve: The aortic valve is tricuspid. Aortic valve regurgitation is not visualized. No aortic stenosis is present. Pulmonic Valve: The pulmonic valve was grossly normal. Pulmonic valve regurgitation is not visualized. No evidence of pulmonic stenosis. Aorta: The aortic root and ascending aorta are structurally normal, with no evidence of dilitation. Venous: The inferior vena cava is dilated in size with less than 50% respiratory variability, suggesting right atrial pressure of 15 mmHg. IAS/Shunts: The atrial septum is grossly normal.  LEFT VENTRICLE PLAX 2D LVIDd:         5.40 cm         Diastology LVIDs:         4.20 cm         LV e' medial:    5.96 cm/s LV PW:         1.00 cm         LV E/e' medial:  29.3 LV IVS:        1.00 cm         LV e' lateral:   8.59 cm/s LVOT diam:     1.80 cm         LV E/e' lateral: 20.3 LV SV:         28 LV SV Index:   13 LVOT Area:     2.54 cm        3D Volume EF                                LV 3D EF:    Left                                             ventricular LV Volumes (MOD)                            ejection LV vol d, MOD    161.0 ml                   fraction by A2C:                                        3D volume LV vol d, MOD    122.0 ml                   is 45 %. A4C: LV vol s, MOD    92.8 ml A2C:                           3D Volume EF: LV vol s, MOD    59.9 ml       3D EF:  45 % A4C:                           LV EDV:       179 ml LV SV MOD A2C:   68.2 ml       LV ESV:       98 ml LV  SV MOD A4C:   122.0 ml      LV SV:        81 ml LV SV MOD BP:    66.7 ml RIGHT VENTRICLE RV S prime:     8.76 cm/s TAPSE (M-mode): 0.8 cm LEFT ATRIUM             Index       RIGHT ATRIUM          Index LA diam:        4.60 cm 2.17 cm/m  RA Area:     9.27 cm LA Vol (A2C):   38.0 ml 17.91 ml/m RA Volume:   15.50 ml 7.30 ml/m LA Vol (A4C):   33.1 ml 15.58 ml/m LA Biplane Vol: 33.8 ml 15.91 ml/m  AORTIC VALVE LVOT Vmax:   88.60 cm/s LVOT Vmean:  57.700 cm/s LVOT VTI:    0.110 m  AORTA Ao Root diam: 2.70 cm Ao Asc diam:  2.60 cm MITRAL VALVE                 TRICUSPID VALVE MV Area (PHT): 2.93 cm      TR Peak grad:   36.7 mmHg MV Area VTI:   0.61 cm      TR Vmax:        303.00 cm/s MV Peak grad:  30.8 mmHg MV Mean grad:  9.3 mmHg      SHUNTS MV Vmax:       2.78 m/s      Systemic VTI:  0.11 m MV Vmean:      199.8 cm/s    Systemic Diam: 1.80 cm MV VTI:        0.46 m MV Decel Time: 259 msec MR Peak grad:    91.4 mmHg MR Mean grad:    58.5 mmHg MR Vmax:         478.00 cm/s MR Vmean:        364.0 cm/s MR PISA:         3.53 cm MR PISA Eff ROA: 19 mm MR PISA Radius:  0.75 cm MV E velocity: 174.67 cm/s MV A velocity: 145.00 cm/s MV E/A ratio:  1.20 Eleonore Chiquito MD Electronically signed by Eleonore Chiquito MD Signature Date/Time: 01/06/2021/3:23:18 PM    Final    ECHO TEE  Result Date: 01/08/2021    TRANSESOPHOGEAL ECHO REPORT   Patient Name:   [name] Date of Exam: 01/08/2021 Medical Rec #:  [id number]       Height:       69.0 in Accession #:    [id number]      Weight:       215.4 lb Date of Birth:  12/16/1975       BSA:          2.132 m Patient Age:    44 years        BP:           98/67 mmHg Patient Gender: F               HR:  100 bpm. Exam Location:  Inpatient Procedure: Transesophageal Echo, 3D Echo, Cardiac Doppler, Color Doppler and            Saline Contrast Bubble Study Indications:     I34.2 Nonrheumatic mitral (valve) stenosis  History:         Patient has prior history of Echocardiogram  examinations, most                  recent 01/06/2021. Stroke and COPD, Mitral Valve Disease and                  Mitral Stenosis; Risk Factors:Diabetes, Hypertension and                  Current Smoker. Mitral stenosis. Heroin overdose. Cocaine use.  Sonographer:     Roseanna Rainbow RDCS Referring Phys:  [id number] Tami Lin [facility] Diagnosing Phys: Kirk Ruths MD PROCEDURE: After discussion of the risks and benefits of a TEE, an informed consent was obtained from the patient. The transesophogeal probe was passed without difficulty through the esophogus of the patient. Imaged were obtained with the patient in a left lateral decubitus position. Sedation performed by different physician. The patient was monitored while under deep sedation. Anesthestetic sedation was provided intravenously by Anesthesiology: '140mg'$  of Propofol. The patient's vital signs; including heart rate, blood pressure, and oxygen saturation; remained stable throughout the procedure. The patient developed no complications during the procedure. IMPRESSIONS  1. Rheumatic MV with moderate MS (mean gradient 8 mmHg; MVA 1.65 cm2 by pressure halftime); severe MR.  2. Left ventricular ejection fraction, by estimation, is 40 to 45%. The left ventricle has mildly decreased function. The left ventricle demonstrates regional wall motion abnormalities (see scoring diagram/findings for description).  3. Right ventricular systolic function is normal. The right ventricular size is normal.  4. Left atrial size was severely dilated. No left atrial/left atrial appendage thrombus was detected.  5. 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 Moderate mitral stenosis.  6. The aortic valve is normal in structure. Aortic valve regurgitation is not visualized. FINDINGS  Left Ventricle: Left ventricular ejection fraction, by estimation, is 40 to 45%. The left ventricle has mildly decreased function. The left ventricle demonstrates regional wall motion  abnormalities. The left ventricular internal cavity size was normal in size. Right Ventricle: The right ventricular size is normal. . Right ventricular systolic function is normal. Left Atrium: Left atrial size was severely dilated. Spontaneous echo contrast was present in the left atrium and left atrial appendage. No left atrial/left atrial appendage thrombus was detected. Right Atrium: Right atrial size was normal in size. Pericardium: There is no evidence of pericardial effusion. 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 Moderate mitral valve stenosis. MV peak gradient, 13.9 mmHg. The mean mitral valve gradient is 8.0 mmHg. Tricuspid Valve: The tricuspid valve is normal in structure. Tricuspid valve regurgitation is mild. Aortic Valve: The aortic valve is normal in structure. Aortic valve regurgitation is not visualized. Pulmonic Valve: The pulmonic valve was normal in structure. Pulmonic valve regurgitation is trivial. Aorta: The aortic root is normal in size and structure. There is minimal (Grade I) plaque involving the descending aorta. IAS/Shunts: No atrial level shunt detected by color flow Doppler. Agitated saline contrast was given intravenously to evaluate for intracardiac shunting. Additional Comments: Rheumatic MV with moderate MS (mean gradient 8 mmHg; MVA 1.65 cm2 by pressure halftime); severe MR.  MITRAL VALVE MV Area (PHT): 2.59 cm MV Peak  grad:  13.9 mmHg MV Mean grad:  8.0 mmHg MV Vmax:       1.86 m/s MV Vmean:      132.3 cm/s MR Peak grad:    96.8 mmHg MR Mean grad:    64.0 mmHg MR Vmax:         492.00 cm/s MR Vmean:        365.0 cm/s MR PISA:         3.08 cm MR PISA Eff ROA: 41 mm MR PISA Radius:  0.70 cm Kirk Ruths MD Electronically signed by Kirk Ruths MD Signature Date/Time: 01/08/2021/4:10:01 PM    Final    VAS [place] UPPER EXTREMITY VENOUS DUPLEX  Result Date: 01/10/2021 UPPER VENOUS STUDY  Indications: Edema, and PICC line left arm  Limitations: Patient positioning, bandages and line. Comparison Study: No prior study on file Performing Technologist: Sharion Dove RVS  Examination Guidelines: A complete evaluation includes B-mode imaging, spectral Doppler, color Doppler, and power Doppler as needed of all accessible portions of each vessel. Bilateral testing is considered an integral part of a complete examination. Limited examinations for reoccurring indications may be performed as noted.  Right Findings: +----------+------------+---------+-----------+----------+--------------+ RIGHT     CompressiblePhasicitySpontaneousProperties   Summary     +----------+------------+---------+-----------+----------+--------------+ IJV                                                 Not visualized +----------+------------+---------+-----------+----------+--------------+ Subclavian    Full       Yes       Yes                             +----------+------------+---------+-----------+----------+--------------+ Axillary      Full       Yes       Yes                             +----------+------------+---------+-----------+----------+--------------+ Brachial      Full       Yes       Yes                             +----------+------------+---------+-----------+----------+--------------+ Radial        Full                                                 +----------+------------+---------+-----------+----------+--------------+ Ulnar                                               Not visualized +----------+------------+---------+-----------+----------+--------------+ Cephalic      Full                                                 +----------+------------+---------+-----------+----------+--------------+ Basilic       Full                                                 +----------+------------+---------+-----------+----------+--------------+  Left Findings:  +----------+------------+---------+-----------+----------+-------+ LEFT      CompressiblePhasicitySpontaneousPropertiesSummary +----------+------------+---------+-----------+----------+-------+ IJV           Full       Yes       Yes                      +----------+------------+---------+-----------+----------+-------+ Subclavian               Yes       Yes                      +----------+------------+---------+-----------+----------+-------+ Axillary                 Yes       Yes               PICC   +----------+------------+---------+-----------+----------+-------+ Brachial      Full       Yes       Yes                      +----------+------------+---------+-----------+----------+-------+ Cephalic      None                                          +----------+------------+---------+-----------+----------+-------+ Basilic       None                                          +----------+------------+---------+-----------+----------+-------+  Summary:  Right: No evidence of deep vein thrombosis in the upper extremity. No evidence of superficial vein thrombosis in the upper extremity.  Left: No evidence of deep vein thrombosis in the upper extremity. Findings consistent with acute superficial vein thrombosis involving the left cephalic vein and left basilic vein.  *See table(s) above for measurements and observations.  Diagnosing physician: Ruta Hinds MD Electronically signed by Ruta Hinds MD on 01/10/2021 at 11:14:12 AM.    Final    DG FL GUIDED LUMBAR PUNCTURE  Result Date: 01/04/2021 CLINICAL DATA:  Encephalopathy. EXAM: DIAGNOSTIC LUMBAR PUNCTURE UNDER FLUOROSCOPIC GUIDANCE COMPARISON:  None FLUOROSCOPY TIME:  Fluoroscopy Time:  0 minutes and 18 seconds. Radiation Exposure Index (if provided by the fluoroscopic device): 4.50 mGy Number of Acquired Spot Images: 0 PROCEDURE: Informed consent was obtained from the patient's mother prior to the procedure,  including potential complications of headache, allergy, and pain. With the patient prone, an appropriate site for lumbar puncture was marked on the patient's skin. The lower back was then prepped with Betadine. 1% Lidocaine was used for local anesthesia. Lumbar puncture was performed at the L3-4 level using a 20 gauge needle with return of clear CSF with an opening pressure of 20 cm water. 10 ml of CSF were obtained for laboratory studies. The patient tolerated the procedure well and there were no apparent complications. IMPRESSION: Fluoroscopic guided lumbar puncture with 10 cc of clear CSF obtained for appropriate laboratory evaluation. Electronically Signed   By: Marijo Sanes M.D.   On: 01/04/2021 12:18   (Echo, Carotid, EGD, Colonoscopy, ERCP)    Subjective: Patient seen and examined.  No overnight events.  No more nausea or vomiting since yesterday afternoon.  She is alert awake.  She wants to go to nursing home as soon as possible  so her mom does not have to drive all the way to [place].  Blood sugars slightly low in the morning but now she is eating regular diet.  Denies any pain.  She cannot recall what all medications he takes at home.   Discharge Exam: Vitals:   01/12/21 0439 01/12/21 0809  BP: 106/64 (!) 108/55  Pulse: (!) 104 (!) 118  Resp: 18 18  Temp: 98.4 F (36.9 C) 98.3 F (36.8 C)  SpO2: 99% 100%   Vitals:   01/11/21 2342 01/12/21 0439 01/12/21 0500 01/12/21 0809  BP: 94/61 106/64  (!) 108/55  Pulse: (!) 106 (!) 104  (!) 118  Resp: '18 18  18  '$ Temp: 98.6 F (37 C) 98.4 F (36.9 C)  98.3 F (36.8 C)  TempSrc: Oral Oral  Oral  SpO2: 100% 99%  100%  Weight:   104.2 kg   Height:        General: Pt is alert, awake, not in acute distress Frail and debilitated.  Chronically sick looking. 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 Patient has an unstageable ulcer on the left plantar aspect, nontender. Generalized weakness but no focal deficits.    The results of significant diagnostics from this hospitalization (including imaging, microbiology, ancillary and laboratory) are listed below for reference.     Microbiology: Recent Results (from the past 240 hour(s))  CSF culture w Gram Stain     Status: None   Collection Time: 01/04/21 11:07 AM   Specimen: PATH Cytology CSF; Cerebrospinal Fluid  Result Value Ref Range Status   Specimen Description CSF  Final   Special Requests NONE  Final   Gram Stain   Final    WBC PRESENT, PREDOMINANTLY MONONUCLEAR NO ORGANISMS SEEN CYTOSPIN SMEAR    Culture   Final    NO GROWTH 3 DAYS Performed at [facility], [address] [place], [place] [address]    Report Status 01/07/2021 FINAL  Final  Anaerobic culture w Gram Stain     Status: None   Collection Time: 01/04/21 11:07 AM   Specimen: PATH Cytology CSF; Cerebrospinal Fluid  Result Value Ref Range Status   Specimen Description CSF  Final   Special Requests NONE  Final   Culture   Final    NO ANAEROBES ISOLATED Performed at [facility], [place] [address] [place], [place] [address]    Report Status 01/09/2021 FINAL  Final  SARS CORONAVIRUS 2 (TAT 6-24 HRS) Nasopharyngeal Nasopharyngeal Swab     Status: None   Collection Time: 01/11/21 10:28 AM   Specimen: Nasopharyngeal Swab  Result Value Ref Range Status   SARS Coronavirus 2 NEGATIVE NEGATIVE Final    Comment: (NOTE) SARS-CoV-2 target nucleic acids are NOT DETECTED.  The SARS-CoV-2 RNA is generally detectable in upper and lower respiratory specimens during the acute phase of infection. Negative results do not preclude SARS-CoV-2 infection, do not rule out co-infections with other pathogens, and should not be used as the sole basis for treatment or other patient management  decisions. Negative results must be combined with clinical observations, patient history, and epidemiological information. The expected result is Negative.  Fact Sheet for Patients: SugarRoll.be  Fact Sheet for Healthcare Providers: https://www.woods-mathews.com/  This test is not yet approved or cleared by the [place] FDA and  has been authorized for detection and/or diagnosis of SARS-CoV-2 by FDA under an Emergency Use Authorization (EUA). This EUA will remain  in effect (meaning this test can be used) for the duration of the COVID-19 declaration under Se ction 564(b)(1) of the Act, 21 U.S.C. section 360bbb-3(b)(1), unless the authorization is terminated or revoked sooner.  Performed at [facility], [place] [address], [place], [place] [address]      Labs: BNP (last 3 results) No results for input(s): BNP in the last 8760 hours. Basic Metabolic Panel: Recent Labs  Lab 01/07/21 0555 01/08/21 0500 01/09/21 0600 01/10/21 0325 01/11/21 1329  NA 140 139 141 137 136  K 4.0 4.4 4.0 4.5 5.4*  CL 116* 115* 117* 107 108  CO2 19* 19* 20* 21* 23  GLUCOSE 79 119* 54* 202* 167*  BUN 31* 31* 28* 27* 26*  CREATININE 2.36* 2.27* 2.23* 2.20* 2.02*  CALCIUM 7.8* 7.9* 8.5* 8.6* 8.3*  MG  --   --  1.8  --  1.5*  PHOS  --   --  4.0  --  3.3   Liver Function Tests: Recent Labs  Lab 01/06/21 0525 01/07/21 0555  AST 11* 9*  ALT 10 8  ALKPHOS 71 62  BILITOT 0.7 0.7  PROT 5.5* 5.1*  ALBUMIN 1.6* 1.4*   No results for input(s): LIPASE, AMYLASE in the last 168 hours. Recent Labs  Lab 01/05/21 1300  AMMONIA 20   CBC: Recent Labs  Lab 01/08/21 0500 01/09/21 0600 01/10/21 0325 01/11/21 1412 01/12/21 0404  WBC 9.1 11.9* 8.9 8.3 7.8  NEUTROABS 6.8 10.1* 7.3 5.8 4.4  HGB 7.8* 9.0* 9.3* 8.5* 8.3*  HCT 25.2* 28.6* 30.0* 25.6* 26.1*  MCV 87.5 86.4 87.2 84.2 87.0  PLT 232 340 424* 378 370   Cardiac Enzymes: No results for  input(s): CKTOTAL, CKMB, CKMBINDEX, TROPONINI in the last 168 hours. BNP: Invalid input(s): POCBNP CBG: Recent Labs  Lab 01/11/21 0512 01/11/21 0701 01/11/21 1153 01/11/21 1556 01/12/21 0619  GLUCAP 113* 116* 162* 253* 76   D-Dimer No results for input(s): DDIMER in the last 72 hours. Hgb A1c No results for input(s): HGBA1C in the last 72 hours. Lipid Profile No results for input(s): CHOL, HDL, LDLCALC, TRIG, CHOLHDL, LDLDIRECT in the last 72 hours. Thyroid function studies No results for input(s): TSH, T4TOTAL, T3FREE, THYROIDAB in the last 72 hours.  Invalid input(s): FREET3 Anemia work up No results for input(s): VITAMINB12, FOLATE, FERRITIN, TIBC, IRON, RETICCTPCT in the last 72 hours. Urinalysis    Component Value Date/Time   COLORURINE YELLOW (A) 08/15/2019 1931   APPEARANCEUR CLEAR (A) 08/15/2019 1931   LABSPEC 1.015 08/15/2019 1931   PHURINE 5.0 08/15/2019 1931   GLUCOSEU >=500 (A) 08/15/2019 1931   HGBUR NEGATIVE 08/15/2019 1931   BILIRUBINUR NEGATIVE 08/15/2019 1931   KETONESUR NEGATIVE 08/15/2019 1931   PROTEINUR NEGATIVE 08/15/2019 1931   UROBILINOGEN 0.2 04/29/2014 0001   NITRITE NEGATIVE 08/15/2019 1931   LEUKOCYTESUR TRACE (A) 08/15/2019 1931   Sepsis Labs Invalid input(s): PROCALCITONIN,  WBC,  LACTICIDVEN Microbiology Recent Results (from the past 240 hour(s))  CSF culture w Gram Stain     Status: None   Collection Time: 01/04/21 11:07 AM   Specimen: PATH Cytology CSF; Cerebrospinal Fluid  Result Value Ref Range Status   Specimen Description CSF  Final   Special Requests NONE  Final   Gram Stain   Final    WBC PRESENT, PREDOMINANTLY MONONUCLEAR NO ORGANISMS SEEN CYTOSPIN SMEAR  Culture   Final    NO GROWTH 3 DAYS Performed at [facility], [place] [address] [place], [place] [address]    Report Status 01/07/2021 FINAL  Final  Anaerobic culture w Gram Stain     Status: None   Collection Time: 01/04/21 11:07 AM   Specimen: PATH  Cytology CSF; Cerebrospinal Fluid  Result Value Ref Range Status   Specimen Description CSF  Final   Special Requests NONE  Final   Culture   Final    NO ANAEROBES ISOLATED Performed at [facility], [place] [address], [place], [place] [address]    Report Status 01/09/2021 FINAL  Final  SARS CORONAVIRUS 2 (TAT 6-24 HRS) Nasopharyngeal Nasopharyngeal Swab     Status: None   Collection Time: 01/11/21 10:28 AM   Specimen: Nasopharyngeal Swab  Result Value Ref Range Status   SARS Coronavirus 2 NEGATIVE NEGATIVE Final    Comment: (NOTE) SARS-CoV-2 target nucleic acids are NOT DETECTED.  The SARS-CoV-2 RNA is generally detectable in upper and lower respiratory specimens during the acute phase of infection. Negative results do not preclude SARS-CoV-2 infection, do not rule out co-infections with other pathogens, and should not be used as the sole basis for treatment or other patient management decisions. Negative results must be combined with clinical observations, patient history, and epidemiological information. The expected result is Negative.  Fact Sheet for Patients: SugarRoll.be  Fact Sheet for Healthcare Providers: https://www.woods-mathews.com/  This test is not yet approved or cleared by the [place] FDA and  has been authorized for detection and/or diagnosis of SARS-CoV-2 by FDA under an Emergency Use Authorization (EUA). This EUA will remain  in effect (meaning this test can be used) for the duration of the COVID-19 declaration under Se ction 564(b)(1) of the Act, 21 U.S.C. section 360bbb-3(b)(1), unless the authorization is terminated or revoked sooner.  Performed at [facility], [place] [address] [place], [place] [address]      Time coordinating discharge:  40 minutes  SIGNED:   Barb Merino, MD  [facility] 01/12/2021, 10:24 AM

## 2021-03-19 ENCOUNTER — Other Ambulatory Visit: Payer: Self-pay

## 2021-03-19 ENCOUNTER — Encounter: Payer: Medicaid Other | Attending: Internal Medicine | Admitting: Internal Medicine

## 2021-03-19 DIAGNOSIS — I509 Heart failure, unspecified: Secondary | ICD-10-CM | POA: Diagnosis not present

## 2021-03-19 DIAGNOSIS — L97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ity: Secondary | ICD-10-CM | POA: Diagnosis not present

## 2021-03-19 DIAGNOSIS — E1122 Type 2 diabetes mellitus with diabetic chronic kidney disease: Secondary | ICD-10-CM | POA: Diagnosis not present

## 2021-03-19 DIAGNOSIS — Z881 Allergy status to other antibiotic agents status: Secondary | ICD-10-CM | POA: Insufficient documentation

## 2021-03-19 DIAGNOSIS — Z79899 Other long term (current) drug therapy: Secondary | ICD-10-CM | POA: Insufficient documentation

## 2021-03-19 DIAGNOSIS — J449 Chronic obstructive pulmonary disease, unspecified: Secondary | ICD-10-CM | POA: Insufficient documentation

## 2021-03-19 DIAGNOSIS — E11621 Type 2 diabetes mellitus with foot ulcer: Secondary | ICD-10-CM | POA: Diagnosis present

## 2021-03-19 DIAGNOSIS — N186 End stage renal disease: Secondary | ICD-10-CM | POA: Insufficient documentation

## 2021-03-19 DIAGNOSIS — G40909 Epilepsy, unspecified, not intractable, without status epilepticus: Secondary | ICD-10-CM | POA: Insufficient documentation

## 2021-03-19 DIAGNOSIS — E1142 Type 2 diabetes mellitus with diabetic polyneuropathy: Secondary | ICD-10-CM | POA: Insufficient documentation

## 2021-03-19 DIAGNOSIS — F17210 Nicotine dependence, cigarettes, uncomplicated: Secondary | ICD-10-CM | POA: Diagnosis not present

## 2021-03-19 DIAGNOSIS — I132 Hypertensive heart and chronic kidney disease with heart failure and with stage 5 chronic kidney disease, or end stage renal disease: Secondary | ICD-10-CM | POA: Insufficient documentation

## 2021-03-19 NOTE — Progress Notes (Signed)
NIKKO, MCFADYEN ([id number]) Visit Report for 03/19/2021 Abuse/Suicide Risk Screen Details Patient Name: [name], [name] Date of Service: 03/19/2021 2:30 PM Medical Record Number: [id number] Patient Account Number: [id number] Date of Birth/Sex: Jan 25, 1976 (45 y.o. F) Treating RN: Cornell Barman Primary Care Louiza Moor: Raelene Bott Other Clinician: Referring Kellianne Ek: Caroline More Treating Leathie Weich/Extender: Tito Dine in Treatment: 0 Abuse/Suicide Risk Screen Items Answer ABUSE RISK SCREEN: Has anyone close to you tried to hurt or harm you recentlyo No Do you feel uncomfortable with anyone in your familyo No Has anyone forced you do things that you didnot want to doo No Electronic Signature(s) Signed: 03/19/2021 3:58:10 PM By: Gretta Cool, BSN, RN, CWS, [name] RN, BSN Entered By: Gretta Cool, BSN, RN, CWS, [name] on 03/19/2021 14:52:37 [name] ([id number]) -------------------------------------------------------------------------------- Activities of Daily Living Details Patient Name: Heather Dear A. Date of Service: 03/19/2021 2:30 PM Medical Record Number: [id number] Patient Account Number: [id number] Date of Birth/Sex: 1976/10/20 (45 y.o. F) Treating RN: Cornell Barman Primary Care Kaaren Nass: Raelene Bott Other Clinician: Referring [place] Searles: Caroline More Treating Ashia Dehner/Extender: Tito Dine in Treatment: 0 Activities of Daily Living Items Answer Activities of Daily Living (Please select one for each item) Drive Automobile Not Able Take Medications Completely Able Use Telephone Completely Able Care for Appearance Need Assistance Use Toilet Need Assistance Bath / Shower Need Assistance Dress Self Need Assistance Feed Self Completely Able Walk Need Assistance Get In / Out Bed Need Assistance Housework Need Assistance Prepare Meals Need Assistance Handle Money Need Assistance Shop for Self Need Assistance Electronic Signature(s) Signed: 03/19/2021 3:58:10  PM By: Gretta Cool, BSN, RN, CWS, [name] RN, BSN Entered By: Gretta Cool, BSN, RN, CWS, [name] on 03/19/2021 14:53:15 [name] ([id number]) -------------------------------------------------------------------------------- Education Screening Details Patient Name: Heather Dear A. Date of Service: 03/19/2021 2:30 PM Medical Record Number: [id number] Patient Account Number: [id number] Date of Birth/Sex: 07-Jan-1976 (45 y.o. F) Treating RN: Cornell Barman Primary Care Shay Jhaveri: Raelene Bott Other Clinician: Referring Briyana Badman: Caroline More Treating Fredrico Beedle/Extender: Tito Dine in Treatment: 0 Primary Learner Assessed: Patient Learning Preferences/Education Level/Primary Language Learning Preference: Explanation, Demonstration Highest Education Level: High School Preferred Language: English Cognitive Barrier Language Barrier: No Translator Needed: No Memory Deficit: No Emotional Barrier: No Physical Barrier Impaired Vision: No Impaired Hearing: No Knowledge/Comprehension Knowledge Level: Medium Comprehension Level: Medium Ability to understand written instructions: Medium Ability to understand verbal instructions: Medium Motivation Anxiety Level: Calm Cooperation: Cooperative Education Importance: Acknowledges Need Interest in Health Problems: Asks Questions Perception: Coherent Willingness to Engage in Self-Management Medium Activities: Readiness to Engage in Self-Management Medium Activities: Electronic Signature(s) Signed: 03/19/2021 3:58:10 PM By: Gretta Cool, BSN, RN, CWS, [name] RN, BSN Entered By: Gretta Cool, BSN, RN, CWS, [name] on 03/19/2021 14:53:58 [name] ([id number]) -------------------------------------------------------------------------------- Fall Risk Assessment Details Patient Name: Heather Dear A. Date of Service: 03/19/2021 2:30 PM Medical Record Number: [id number] Patient Account Number: [id number] Date of Birth/Sex: 05-Aug-1976 (45 y.o. F) Treating RN:  Cornell Barman Primary Care Gabrelle Roca: Raelene Bott Other Clinician: Referring Oreta Soloway: Caroline More Treating Jaykub Mackins/Extender: Tito Dine in Treatment: 0 Fall Risk Assessment Items Have you had 2 or more falls in the last 12 monthso 0 Yes Have you had any fall that resulted in injury in the last 12 monthso 0 No FALLS RISK SCREEN History of falling - immediate or within 3 months 25 Yes Secondary diagnosis (Do you have 2 or more medical diagnoseso) 0 No Ambulatory aid None/bed rest/wheelchair/nurse 0 Yes Crutches/cane/walker 0 No Furniture 0 No Intravenous therapy  Access/Saline/Heparin Lock 0 No Gait/Transferring Normal/ bed rest/ wheelchair 0 Yes Weak (short steps with or without shuffle, stooped but able to lift head while walking, may 0 No seek support from furniture) Impaired (short steps with shuffle, may have difficulty arising from chair, head down, impaired 0 No balance) Mental Status Oriented to own ability 0 No Electronic Signature(s) Signed: 03/19/2021 3:58:10 PM By: Gretta Cool, BSN, RN, CWS, [name] RN, BSN Entered By: Gretta Cool, BSN, RN, CWS, [name] on 03/19/2021 14:54:31 [name] ([id number]) -------------------------------------------------------------------------------- Foot Assessment Details Patient Name: Heather Dear A. Date of Service: 03/19/2021 2:30 PM Medical Record Number: [id number] Patient Account Number: [id number] Date of Birth/Sex: 1975/11/22 (45 y.o. F) Treating RN: Cornell Barman Primary Care Chaunce Winkels: Raelene Bott Other Clinician: Referring Aubryanna Nesheim: Caroline More Treating Taraji Mungo/Extender: Tito Dine in Treatment: 0 Foot Assessment Items Site Locations + = Sensation present, - = Sensation absent, C = Callus, U = Ulcer R = Redness, W = Warmth, M = Maceration, PU = Pre-ulcerative lesion F = Fissure, S = Swelling, D = Dryness Assessment Right: Left: Other Deformity: No No Prior Foot Ulcer: No Yes Prior Amputation: No  Yes Charcot Joint: No No Ambulatory Status: Non-ambulatory Assistance Device: Wheelchair Gait: Unsteady 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 Date of Service: 03/19/2021 2:30 PM Medical Record Number: [id number] Patient Account Number: [id number] Date of Birth/Sex: 1976/04/02 (45 y.o. F) Treating RN: Cornell Barman Primary Care Kaedence Connelly: Raelene Bott Other Clinician: Referring Keyra Virella: Caroline More Treating Ataya Murdy/Extender: Tito Dine in Treatment: 0 Height (in): 69 Weight (lbs): 185 Body Mass Index (BMI): 27.3 Nutrition Risk Screening Items Score Screening NUTRITION RISK SCREEN: I have an illness or condition that made me change the kind and/or amount of food I eat 0 No I eat fewer than two meals per day 0 No I eat few fruits and vegetables, or milk products 0 No I have three or more drinks of beer, liquor or wine almost every day 0 No I have tooth or mouth problems that make it hard for me to eat 0 No I don't always have enough money to buy the food I need 0 No I eat alone most of the time 0 No I take three or more different prescribed or over-the-counter drugs a day 1 Yes Without wanting to, I have lost or gained 10 pounds in the last six months 0 No I am not always physically able to shop, cook and/or feed myself 0 No Nutrition Protocols Good Risk Protocol 0 No interventions needed Moderate Risk Protocol High Risk Proctocol Risk Level: Good Risk Score: 1 Electronic Signature(s) Signed: 03/19/2021 3:58:10 PM By: Gretta Cool, BSN, RN, CWS, [name] RN, BSN Entered By: Gretta Cool, BSN, RN, CWS, [name] on 03/19/2021 14:54:56

## 2021-03-23 NOTE — Progress Notes (Signed)
Heather, [name] ([id number]) Visit Report for 03/19/2021 Allergy List Details Patient Name: [name], [name] Date of Service: 03/19/2021 2:30 PM Medical Record Number: [id number] Patient Account Number: [id number] Date of Birth/Sex: 11/01/1975 (45 y.o. F) Treating RN: Cornell Barman Primary Care Antwon Rochin: Raelene Bott Other Clinician: Referring Chaka Boyson: Caroline More Treating Micaylah Bertucci/Extender: Tito Dine in Treatment: 0 Allergies Active Allergies erythromycin base Allergy Notes Electronic Signature(s) Signed: 03/19/2021 3:58:10 PM By: Gretta Cool, BSN, RN, CWS, [name] RN, BSN Entered By: Gretta Cool, BSN, RN, CWS, [name] on 03/19/2021 14:47:52 [name] ([id number]) -------------------------------------------------------------------------------- Arrival Information Details Patient Name: Heather Dear A. Date of Service: 03/19/2021 2:30 PM Medical Record Number: [id number] Patient Account Number: [id number] Date of Birth/Sex: 09/30/1976 (45 y.o. F) Treating RN: Cornell Barman Primary Care Chord Takahashi: Raelene Bott Other Clinician: Referring Jensyn Cambria: Caroline More Treating Issiah Huffaker/Extender: Tito Dine in Treatment: 0 Visit Information Patient Arrived: Wheel Chair Arrival Time: 14:35 Accompanied By: self Transfer Assistance: None Patient Identification Verified: Yes Secondary Verification Process Completed: Yes Patient Requires Transmission-Based No Precautions: Patient Has Alerts: Yes Patient Alerts: Patient on Blood Thinner '81mg'$  apirin Diabetic Type II History Since Last Visit Electronic Signature(s) Signed: 03/19/2021 3:58:10 PM By: Gretta Cool, BSN, RN, CWS, [name] RN, BSN Entered By: Gretta Cool, BSN, RN, CWS, [name] on 03/19/2021 14:36:35 [name] ([id number]) -------------------------------------------------------------------------------- Clinic Level of Care Assessment Details Patient Name: Heather Dear A. Date of Service: 03/19/2021 2:30 PM Medical Record  Number: [id number] Patient Account Number: [id number] Date of Birth/Sex: 09/01/1976 (45 y.o. F) Treating RN: Carlene Coria Primary Care Genevia Bouldin: Raelene Bott Other Clinician: Referring Ellamae Lybeck: Caroline More Treating [place]/Extender: Tito Dine in Treatment: 0 Clinic Level of Care Assessment Items TOOL 1 Quantity Score X - Use when EandM and Procedure is performed on INITIAL visit 1 0 ASSESSMENTS - Nursing Assessment / Reassessment X - General Physical Exam (combine w/ comprehensive assessment (listed just below) when performed on new 1 20 pt. evals) X- 1 25 Comprehensive Assessment (HX, ROS, Risk Assessments, Wounds Hx, etc.) ASSESSMENTS - Wound and Skin Assessment / Reassessment '[]'$  - Dermatologic / Skin Assessment (not related to wound area) 0 ASSESSMENTS - Ostomy and/or Continence Assessment and Care '[]'$  - Incontinence Assessment and Management 0 '[]'$  - 0 Ostomy Care Assessment and Management (repouching, etc.) PROCESS - Coordination of Care X - Simple Patient / Family Education for ongoing care 1 15 '[]'$  - 0 Complex (extensive) Patient / Family Education for ongoing care X- 1 10 Staff obtains Programmer, systems, Records, Test Results / Process Orders '[]'$  - 0 Staff telephones HHA, Nursing Homes / Clarify orders / etc '[]'$  - 0 Routine Transfer to another Facility (non-emergent condition) '[]'$  - 0 Routine Hospital Admission (non-emergent condition) X- 1 15 New Admissions / Biomedical engineer / Ordering NPWT, Apligraf, etc. '[]'$  - 0 Emergency Hospital Admission (emergent condition) PROCESS - Special Needs '[]'$  - Pediatric / Minor Patient Management 0 '[]'$  - 0 Isolation Patient Management '[]'$  - 0 Hearing / Language / Visual special needs '[]'$  - 0 Assessment of Community assistance (transportation, D/C planning, etc.) '[]'$  - 0 Additional assistance / Altered mentation '[]'$  - 0 Support Surface(s) Assessment (bed, cushion, seat, etc.) INTERVENTIONS - Miscellaneous '[]'$  - External  ear exam 0 '[]'$  - 0 Patient Transfer (multiple staff / Civil Service fast streamer / Similar devices) '[]'$  - 0 Simple Staple / Suture removal (25 or less) '[]'$  - 0 Complex Staple / Suture removal (26 or more) '[]'$  - 0 Hypo/Hyperglycemic Management (do not check if billed separately) '[]'$  - 0  Ankle / Brachial Index (ABI) - do not check if billed separately Has the patient been seen at the hospital within the last three years: Yes Total Score: 85 Level Of Care: New/Established - Level 3 Clapsaddle, Braden A. ([id number]) Electronic Signature(s) Signed: 03/23/2021 3:50:09 PM By: Carlene Coria RN Entered By: Carlene Coria on 03/19/2021 15:39:14 [name] ([id number]) -------------------------------------------------------------------------------- Encounter Discharge Information Details Patient Name: Heather Dear A. Date of Service: 03/19/2021 2:30 PM Medical Record Number: [id number] Patient Account Number: [id number] Date of Birth/Sex: [birth year] (45 y.o. F) Treating RN: [name] Primary Care Huntington Leverich: Raelene Bott Other Clinician: Referring Kouper Spinella: Caroline More Treating Romonia Yanik/Extender: Tito Dine in Treatment: 0 Encounter Discharge Information Items Post Procedure Vitals Discharge Condition: Stable Temperature (F): 98.6 Ambulatory Status: Wheelchair Pulse (bpm): 87 Discharge Destination: Home Respiratory Rate (breaths/min): 16 Transportation: Private Auto Blood Pressure (mmHg): 109/70 Accompanied By: mother Schedule Follow-up Appointment: Yes Clinical Summary of Care: Electronic Signature(s) Signed: 03/19/2021 4:05:26 PM By: Georges Mouse, Minus Breeding RN Entered By: Georges Mouse, Heather on 03/19/2021 15:54:18 [name] ([id number]) -------------------------------------------------------------------------------- Lower Extremity Assessment Details Patient Name: Heather Dear A. Date of Service: 03/19/2021 2:30 PM Medical Record Number: [id number] Patient Account  Number: [id number] Date of Birth/Sex: 04-14-1976 (45 y.o. F) Treating RN: Cornell Barman Primary Care Dmauri Rosenow: Raelene Bott Other Clinician: Referring Janeen Watson: Caroline More Treating Darnette Lampron/Extender: Tito Dine in Treatment: 0 Edema Assessment Assessed: [Left: No] [Right: No] Edema: [Left: N] [Right: o] Vascular Assessment Pulses: Dorsalis Pedis Palpable: [Left:Yes] Posterior Tibial Palpable: [Left:Yes] Blood Pressure: Brachial: [Left:100] Dorsalis Pedis: 105 Ankle: Posterior Tibial: 99 Ankle Brachial Index: [Left:1.05] Electronic Signature(s) Signed: 03/19/2021 3:25:29 PM By: Gretta Cool, BSN, RN, CWS, [name] RN, BSN Entered By: Gretta Cool, BSN, RN, CWS, [name] on 03/19/2021 15:25:29 [name] ([id number]) -------------------------------------------------------------------------------- Multi Wound Chart Details Patient Name: Heather Dear A. Date of Service: 03/19/2021 2:30 PM Medical Record Number: [id number] Patient Account Number: [id number] Date of Birth/Sex: 08/23/1976 (45 y.o. F) Treating RN: Carlene Coria Primary Care Masaji Billups: Raelene Bott Other Clinician: Referring Howard Bunte: Caroline More Treating Jashawn Floyd/Extender: Tito Dine in Treatment: 0 Vital Signs Height(in): 19 Pulse(bpm): 88 Weight(lbs): 185 Blood Pressure(mmHg): 109/70 Body Mass Index(BMI): 27 Temperature(F): 98.6 Respiratory Rate(breaths/min): 16 Photos: [N/A:N/A] Wound Location: Left, Midline, Plantar Foot N/A N/A Wounding Event: Gradually Appeared N/A N/A Primary Etiology: Pressure Ulcer N/A N/A Comorbid History: Chronic sinus problems/congestion, N/A N/A Middle ear problems, Anemia, Chronic Obstructive Pulmonary Disease (COPD), Congestive Heart Failure, Type II Diabetes, End Stage Renal Disease, History of pressure wounds, Neuropathy Date Acquired: 08/07/2020 N/A N/A Weeks of Treatment: 0 N/A N/A Wound Status: Open N/A N/A Measurements L x W x D (cm) 1.7x2x0.5 N/A  N/A Area (cm) : 2.67 N/A N/A Volume (cm) : 1.335 N/A N/A % Reduction in Area: 0.00% N/A N/A % Reduction in Volume: 0.00% N/A N/A Classification: Category/Stage II N/A N/A Exudate Amount: Medium N/A N/A Exudate Type: Serous N/A N/A Exudate Color: amber N/A N/A Wound Margin: Epibole N/A N/A Granulation Amount: Large (67-100%) N/A N/A Granulation Quality: Pale N/A N/A Necrotic Amount: None Present (0%) N/A N/A Exposed Structures: Fat Layer (Subcutaneous Tissue): N/A N/A Yes Fascia: No Tendon: No Muscle: No Joint: No Bone: No Epithelialization: None N/A N/A Debridement: Debridement - Excisional N/A N/A Pre-procedure Verification/Time 15:25 N/A N/A Out Taken: Pain Control: Lidocaine 4% Topical Solution N/A N/A Tissue Debrided: Subcutaneous, Slough N/A N/A Level: Skin/Subcutaneous Tissue N/A N/A Debridement Area (sq cm): 6.25 N/A N/A Instrument: Curette N/A N/A Bleeding: Moderate N/A N/A Balliet,  Ladana A. ([id number]) Hemostasis Achieved: Silver Nitrate N/A N/A Procedural Pain: 0 N/A N/A Post Procedural Pain: 0 N/A N/A Debridement Treatment Procedure was tolerated well N/A N/A Response: Post Debridement 2.5x2.5x1.7 N/A N/A Measurements L x W x D (cm) Post Debridement Volume: 8.345 N/A N/A (cm) Post Debridement Stage: Category/Stage II N/A N/A Procedures Performed: Debridement N/A N/A Treatment Notes Wound #7 (Foot) Wound Laterality: Plantar, Left, Midline Cleanser Soap and Water Discharge Instruction: Gently cleanse wound with antibacterial soap, rinse and pat dry prior to dressing wounds Peri-Wound Care Topical Primary Dressing Prisma 4.34 (in) Discharge Instruction: Moisten with hydrogel Secondary Dressing Bordered Gauze Sterile-HBD 4x4 (in/in) Discharge Instruction: Cover wound with Bordered Guaze Sterile as directed Secured With Compression Wrap Compression Stockings Add-Ons Electronic Signature(s) Signed: 03/19/2021 4:35:25 PM By: Linton Ham  MD Entered By: Linton Ham on 03/19/2021 15:56:52 [name] ([id number]) -------------------------------------------------------------------------------- [place] Details Patient Name: Heather Dear A. Date of Service: 03/19/2021 2:30 PM Medical Record Number: [id number] Patient Account Number: [id number] Date of Birth/Sex: [birth year] (45 y.o. F) Treating RN: Carlene Coria Primary Care Elkin Belfield: Raelene Bott Other Clinician: Referring Mansfield Dann: Caroline More Treating Eisha Chatterjee/Extender: Tito Dine in Treatment: 0 Active Inactive Abuse / Safety / Falls / Self Care Management Nursing Diagnoses: Potential for injury related to falls Goals: Patient/caregiver will verbalize understanding of skin care regimen Date Initiated: 03/19/2021 Target Resolution Date: 04/19/2021 Goal Status: Active Patient/caregiver will verbalize/demonstrate measures taken to prevent injury and/or falls Date Initiated: 03/19/2021 Target Resolution Date: 04/19/2021 Goal Status: Active Interventions: Assess Activities of Daily Living upon admission and as needed Assess fall risk on admission and as needed Assess: immobility, friction, shearing, incontinence upon admission and as needed Assess impairment of mobility on admission and as needed per policy Assess personal safety and home safety (as indicated) on admission and as needed Assess self care needs on admission and as needed Notes: Wound/Skin Impairment Nursing Diagnoses: Knowledge deficit related to ulceration/compromised skin integrity Goals: Patient/caregiver will verbalize understanding of skin care regimen Date Initiated: 03/19/2021 Target Resolution Date: 04/19/2021 Goal Status: Active Ulcer/skin breakdown will have a volume reduction of 30% by week 4 Date Initiated: 03/19/2021 Target Resolution Date: 04/19/2021 Goal Status: Active Ulcer/skin breakdown will have a volume reduction of 50% by week 8 Date  Initiated: 03/19/2021 Target Resolution Date: 05/19/2021 Goal Status: Active Ulcer/skin breakdown will have a volume reduction of 80% by week 12 Date Initiated: 03/19/2021 Target Resolution Date: 06/19/2021 Goal Status: Active Ulcer/skin breakdown will heal within 14 weeks Date Initiated: 03/19/2021 Target Resolution Date: 07/20/2021 Goal Status: Active Interventions: Assess patient/caregiver ability to obtain necessary supplies Assess patient/caregiver ability to perform ulcer/skin care regimen upon admission and as needed Assess ulceration(s) every visit Notes: LILLAH, WITTMANN ([id number]) Electronic Signature(s) Signed: 03/23/2021 3:50:09 PM By: Carlene Coria RN Entered By: Carlene Coria on 03/19/2021 15:26:13 Aylward, Stormey A. ([id number]) -------------------------------------------------------------------------------- Pain Assessment Details Patient Name: Heather Dear A. Date of Service: 03/19/2021 2:30 PM Medical Record Number: [id number] Patient Account Number: [id number] Date of Birth/Sex: February 13, 1976 (45 y.o. F) Treating RN: Cornell Barman Primary Care Arlo Butt: Raelene Bott Other Clinician: Referring Matilynn Dacey: Caroline More Treating Chaim Gatley/Extender: Tito Dine in Treatment: 0 Active Problems Location of Pain Severity and Description of Pain Patient Has Paino No Site Locations Pain Management and Medication Current Pain Management: Notes Patient denies pain at this time. Electronic Signature(s) Signed: 03/19/2021 3:58:10 PM By: Gretta Cool, BSN, RN, CWS, [name] RN, BSN Entered By: Gretta Cool, BSN, RN, CWS, [name] on 03/19/2021 14:37:00 Heather Dear  A. ([id number]) -------------------------------------------------------------------------------- Patient/Caregiver Education Details Patient Name: [name], Gracemarie A. Date of Service: 03/19/2021 2:30 PM Medical Record Number: [id number] Patient Account Number: [id number] Date of Birth/Gender: 05-03-1976 (45 y.o. F) Treating RN: Carlene Coria Primary Care Physician: Raelene Bott Other Clinician: Referring Physician: Caroline More Treating Physician/Extender: Tito Dine in Treatment: 0 Education Assessment Education Provided To: Patient Education Topics Provided Wound/Skin Impairment: Methods: Explain/Verbal Responses: State content correctly Electronic Signature(s) Signed: 03/23/2021 3:50:09 PM By: Carlene Coria RN Entered By: Carlene Coria on 03/19/2021 15:39:29 Brissett, Takeyla A. ([id number]) -------------------------------------------------------------------------------- Wound Assessment Details Patient Name: Heather Dear A. Date of Service: 03/19/2021 2:30 PM Medical Record Number: [id number] Patient Account Number: [id number] Date of Birth/Sex: 1976-08-16 (45 y.o. F) Treating RN: Cornell Barman Primary Care Nikkie Liming: Raelene Bott Other Clinician: Referring Lesia Monica: Caroline More Treating Marelly Wehrman/Extender: Tito Dine in Treatment: 0 Wound Status Wound Number: 7 Primary Pressure Ulcer Etiology: Wound Location: Left, Midline, Plantar Foot Wound Open Wounding Event: Gradually Appeared Status: Date Acquired: 08/07/2020 Comorbid Chronic sinus problems/congestion, Middle ear problems, Weeks Of Treatment: 0 History: Anemia, Chronic Obstructive Pulmonary Disease (COPD), Clustered Wound: No Congestive Heart Failure, Type II Diabetes, End Stage Renal Disease, History of pressure wounds, Neuropathy Photos Wound Measurements Length: (cm) 1.7 Width: (cm) 2 Depth: (cm) 0.5 Area: (cm) 2.67 Volume: (cm) 1.335 % Reduction in Area: 0% % Reduction in Volume: 0% Epithelialization: None Tunneling: No Undermining: No Wound Description Classification: Category/Stage II Wound Margin: Epibole Exudate Amount: Medium Exudate Type: Serous Exudate Color: amber Foul Odor After Cleansing: No Slough/Fibrino No Wound Bed Granulation Amount: Large (67-100%) Exposed Structure Granulation  Quality: Pale Fascia Exposed: No Necrotic Amount: None Present (0%) Fat Layer (Subcutaneous Tissue) Exposed: Yes Tendon Exposed: No Muscle Exposed: No Joint Exposed: No Bone Exposed: No Treatment Notes Wound #7 (Foot) Wound Laterality: Plantar, Left, Midline Cleanser Soap and Water Discharge Instruction: Gently cleanse wound with antibacterial soap, rinse and pat dry prior to dressing wounds Bryand, Ylianna A. ([id number]) Peri-Wound Care Topical Primary Dressing Prisma 4.34 (in) Discharge Instruction: Moisten with hydrogel Secondary Dressing Bordered Gauze Sterile-HBD 4x4 (in/in) Discharge Instruction: Cover wound with Bordered Guaze Sterile as directed Secured With Compression Wrap Compression Stockings Environmental education officer) Signed: 03/19/2021 3:41:15 PM By: Gretta Cool, BSN, RN, CWS, [name] RN, BSN Entered By: Gretta Cool, BSN, RN, CWS, [name] on 03/19/2021 15:41:14 [name] ([id number]) -------------------------------------------------------------------------------- [place] Details Patient Name: Heather Dear A. Date of Service: 03/19/2021 2:30 PM Medical Record Number: [id number] Patient Account Number: [id number] Date of Birth/Sex: 13-Apr-1976 (45 y.o. F) Treating RN: Cornell Barman Primary Care [place]: Raelene Bott Other Clinician: Referring Moosa Bueche: Caroline More Treating Zonnie Landen/Extender: Tito Dine in Treatment: 0 Vital Signs Time Taken: 14:37 Temperature (F): 98.6 Height (in): 69 Pulse (bpm): 87 Weight (lbs): 185 Respiratory Rate (breaths/min): 16 Body Mass Index (BMI): 27.3 Blood Pressure (mmHg): 109/70 Reference Range: 80 - 120 mg / dl Electronic Signature(s) Signed: 03/19/2021 3:58:10 PM By: Gretta Cool, BSN, RN, CWS, [name] RN, BSN Entered By: Gretta Cool, BSN, RN, CWS, [name] on 03/19/2021 14:37:42

## 2021-03-23 NOTE — Progress Notes (Signed)
DARIANNY, FRANGIPANE ([id number]) Visit Report for 03/19/2021 Chief Complaint Document Details Patient Name: RAMINA, SACCHETTI A. Date of Service: 03/19/2021 2:30 PM Medical Record Number: [id number] Patient Account Number: [id number] Date of Birth/Sex: 28-Dec-1975 (45 y.o. F) Treating RN: Carlene Coria Primary Care Provider: Raelene Bott Other Clinician: Referring Provider: Caroline More Treating Provider/Extender: Tito Dine in Treatment: 0 Information Obtained from: Patient Chief Complaint Left great toe ulcer 03/19/2021; patient referred by Dr. Luana Shu who has been looking after her left foot for quite a period of time for review of a nonhealing area in the left midfoot Electronic Signature(s) Signed: 03/19/2021 4:35:25 PM By: Linton Ham MD Entered By: Linton Ham on 03/19/2021 15:57:51 Kettles, Sheray AMarland Kitchen ([id number]) -------------------------------------------------------------------------------- Debridement Details Patient Name: [name] Date of Service: 03/19/2021 2:30 PM Medical Record Number: [id number] Patient Account Number: [id number] Date of Birth/Sex: 10/29/1975 (45 y.o. F) Treating RN: Carlene Coria Primary Care Provider: Raelene Bott Other Clinician: Referring Provider: Caroline More Treating Provider/Extender: Tito Dine in Treatment: 0 Debridement Performed for Wound #7 Left,Midline,Plantar Foot Assessment: Performed By: Physician Ricard [name], MD Debridement Type: Debridement Level of Consciousness (Pre- Awake and Alert procedure): Pre-procedure Verification/Time Out Yes - 15:25 Taken: Start Time: 15:25 Pain Control: Lidocaine 4% Topical Solution Total Area Debrided (L x W): 2.5 (cm) x 2.5 (cm) = 6.25 (cm) Tissue and other material Viable, Non-Viable, Slough, Subcutaneous, Skin: Dermis , Skin: Epidermis, Slough debrided: Level: Skin/Subcutaneous Tissue Debridement Description: Excisional Instrument:  Curette Bleeding: Moderate Hemostasis Achieved: Silver Nitrate End Time: 15:30 Procedural Pain: 0 Post Procedural Pain: 0 Response to Treatment: Procedure was tolerated well Level of Consciousness (Post- Awake and Alert procedure): Post Debridement Measurements of Total Wound Length: (cm) 2.5 Stage: Category/Stage II Width: (cm) 2.5 Depth: (cm) 1.7 Volume: (cm) 8.345 Character of Wound/Ulcer Post Debridement: Improved Post Procedure Diagnosis Same as Pre-procedure Electronic Signature(s) Signed: 03/19/2021 4:35:25 PM By: Linton Ham MD Signed: 03/23/2021 3:50:09 PM By: Carlene Coria RN Entered By: Linton Ham on 03/19/2021 15:57:09 Christoffersen, Luzelena A. ([id number]) -------------------------------------------------------------------------------- HPI Details Patient Name: [name] Date of Service: 03/19/2021 2:30 PM Medical Record Number: [id number] Patient Account Number: [id number] Date of Birth/Sex: Apr 19, 1976 (45 y.o. F) Treating RN: Carlene Coria Primary Care Provider: Raelene Bott Other Clinician: Referring Provider: Caroline More Treating Provider/Extender: Tito Dine in Treatment: 0 History of Present Illness HPI Description: 01/18/18-She is here for initial evaluation of the left great toe ulcer. She is a poor historian in regards to timeframe in detail. She states approximately 4 weeks ago she lacerated her toe on something in the house. She followed up with her primary care who placed her on Bactrim and ultimately a second dose of Bactrim prior to coming to wound clinic. She states she has been treating the toe with peroxide, Betadine and a Band-Aid. She did not check her blood sugar this morning but checked it yesterday morning it was 327; she is unaware of a recent A1c and there are no current records. She saw Dr. she would've orthopedics last week for an old injury to the left ankle, she states he did not see her toe, nor did she bring it to  his attention. She smokes approximately 1 pack cigarettes a day. Her social situation is concerning, she arrives this morning with her mother who appears extremely intoxicated/under the influence; her mother was asked to leave the room and be monitored by the patient's grandmother. The patient's aunt then accompanied the patient and the room throughout  the rest of the appointment. We had a lengthy discussion regarding the deleterious effects of uncontrolled hyperglycemia and smoking as it relates to wound healing and overall health. She was strongly encouraged to decrease her smoking and get her diabetes under better control. She states she is currently on a diet and has cut down her [facility] consumption. The left toe is erythematous, macerated and slightly edematous with malodor present. The edema in her left foot is below her baseline, there is no erythema streaking. We will treat her with Santyl, doxycycline; we have ordered and xray, culture and provided a Peg assist surgical shoe and cultured the wound. 01/25/18-She is here in follow-up evaluation for a left great toe ulcer and presents with an abscess to her suprapubic area. She states her blood sugars remain elevated, feeling "sick" and if levels are below 250, but she is trying. She has made no attempt to decrease her smoking stating that we "can't take away her food in her cigarettes". She has been compliant with offloading using the PEG assist you. She is using Santyl daily. the culture obtained last week grew staph aureus and Enterococcus faecalis; continues on the doxycycline and Augmentin was added on Monday. The suprapubic area has erythema, no femoral variation, purple discoloration, minimal induration, was accessed with a cotton tip applicator with sanguinopurulent drainage, this was cultured, I suspect the current antibiotic treatment will cover and we will not add anything to her current treatment plan. She was advised to go to  urgent care or ER with any change in redness, induration or fever. 02/01/18-She is here in follow-up evaluation for left great toe ulcers and a new abdominal abscess from last week. She was able to use packing until earlier this week, where she "forgot it was there". She states she was feeling ill with GI symptoms last week and was not taking her antibiotic. She states her glucose levels have been predominantly less than 200, with occasional levels between 200-250. She thinks this was contributing to her GI symptoms as they have resolved without intervention. There continues to be significant laceration to left toe, otherwise it clinically looks stable/improved. There is now less superficial opening to the lateral aspect of the great toe that was residual blister. We will transition to [facility] to all wounds, she will continue her Augmentin. If there is no change or deterioration next week for reculture. 02/08/18-She is here in follow-up evaluation for left great toe ulcer and abdominal ulcer. There is an improvement in both wounds. She has been wrapping her left toe with coban, not by our direction, which has created an area of discoloration to the medial aspect; she has been advised to NOT use coban secondary to her neuropathy. She states her glucose levels have been high over this last week ranging from 200-350, she continues to smoke. She admits to being less compliant with her offloading shoe. We will continue with same treatment plan and she will follow-up next week. 02/15/18-She is here in follow-up evaluation for left great toe ulcer and abdominal ulcer. The abdominal ulcer is epithelialized. The left great toe ulcer is improved and all injury from last week using the Coban wrap is resolved, the lateral ulcer is healed. She admits to noncompliance with wearing offloading shoe and admits to glucose levels being greater than 300 most of the week. She continues to smoke and expresses no  desire to quit. There is one area medially that probes deeper than it has historically, erythema to the toe  and dorsal foot has consistently waxed and waned. There is no overt signs of cellulitis or infection but we will culture the wound for any occult infection given the new area of depth and erythema. We will hold off on sensitivities for initiation of antibiotic therapy. 02/22/18-She is here in follow up evaluation for left great toe ulcer. There is overall significant improvement in both wound appearance, erythema and edema with changes made last week. She was not initiated on antibiotic therapy. Culture obtained last week showed oxacillin sensitive staph aureus, sensitive to clindamycin. Clindamycin has been called into the pharmacy but she has been instructed to hold off on initiation secondary to overall clinical improvement and her history of antibiotic intolerance. She has been instructed to contact the clinic with any noted changes/deterioration and the wound, erythema, edema and/or pain. She will follow-up next week. She continues to smoke and her glucose levels remain elevated >250; she admits to compliance with offloading shoe 03/01/18 on evaluation today patient appears to be doing fairly well in regard to her left first toe ulcer. She has been tolerating the dressing changes with the [facility] Dressing without complication and overall this has definitely showed signs of improvement according to records as well is what the patient tells me today. I'm very pleased in that regard. She is having no pain today 03/08/18 She is here for follow up evaluation of a left great toe ulcer. She remains non-compliant with glucose control and smoking cessation; glucose levels consistently >200. She states that she got new shoe inserts/peg assist. She admits to compliance with offloading. Since my last evaluation there is significant improvement. We will switch to prisma at this time and she will  follow up next week. She is noted to be tachycardic at this appointment, heart rate 120s; she has a history of heart rate 70-130 according to our records. She admits to extreme agitation r/t personal issues; she was advised to monitor her heartrate and contact her physician if it does not return to a more normal range (<100). She takes cardizem twice daily. 03/15/18-She is here in follow-up evaluation for left great toe ulcer. She remains noncompliant with glucose control and smoking cessation. She admits to compliance with wearing offloading shoe. The ulcer is improved/stable and we will continue with the same treatment plan and she will follow-up next week 03/22/18-She is here for evaluation for left great toe ulcer. There continues to be significant improvement despite recurrent hyperglycemia (over 500 yesterday) and she continues to smoke. She has been compliant with offloading and we will continue with same treatment plan and she will follow-up next week. 03/29/18-She is here for evaluation for left great toe ulcer. Despite continuing to smoke and uncontrolled diabetes she continues to improve. She is compliant with offloading shoe. We will continue with the same treatment plan and she will follow-up next week 04/05/18- She is here in follow up evaluation for a left great toe ulcer; she presents with small pustule to left fifth toe (resembles ant bite). She admits to compliance with wearing offloading shoe; continues to smoke or have uncontrolled blood glucose control. There is more callus than usual with evidence of bleeding; she denies known trauma. 04/12/18-She is here for evaluation of left great toe ulcer. Despite noncompliance with glycemic control and smoking she continues to make Pitstick, Jaimy A. ([id number]) improvement. She continues to wear offloading shoe. The pustule, that was identified last week, to the left fifth toe is resolved. She will follow-up in 2 weeks 05/03/18-she  is seen in  follow-up evaluation for a left great toe ulcer. She is compliant with offloading, otherwise noncompliant with glycemic control and smoking. She has plateaued and there is minimal improvement noted. We will transition to [facility], replaced the insert to her surgical shoe and she will follow-up in one week 05/10/18- She is here in follow up evaluation for a left great toe ulcer. It appears stable despite measurement change. We will continue with same treatment plan and follow up next week. 05/24/18-She is seen in follow-up evaluation for a left great toe ulcer. She remains compliant with offloading, has made significant improvement in her diet, decreasing the amount of sugar/soda. She said her recent A1c was 10.9 which is lower than. She did see a diabetic nutritionist/educator yesterday. She continues to smoke. We will continue with the same treatment plan and she'll follow-up next week. 05/31/18- She is seen in follow-up evaluation for left great toe ulcer. She continues to remain compliant with offloading, continues to make improvement in her diet, increasing her water and decreasing the amount of sugar/soda. She does continue to smoke with no desire to quit. We will apply Prisma to the depth and Hydrofera Blue over. We have not received insurance authorization for oasis. She will follow up next week. 06/07/18-She is seen in follow-up evaluation for left great toe ulcer. It has stalled according to today's measurements although base appears stable. She says she saw a diabetic educator yesterday; her average blood sugars are less than 300 which is an improvement for her. She continues to smoke and states "that's my next step" She continues with water over soda. We will order for xray, culture and reinstate ace wrap compression prior to placing apligraf for next week. She is voicing no complaints or concerns. Her dressing will change to iodoflex over the next week in preparation for  apligraf. 06/14/18-She is seen in follow-up evaluation for left great toe ulcer. Plain film x-ray performed last week was negative for osteomyelitis. Wound culture obtained last week grew strep B and OSSA; she is initiated on keflex and cefdinir today; there is erythema to the toe which could be from ace wrap compression, she has a history of wrapping too tight and has has been encouraged to maintain ace wraps that we place today. We will hold off on application of apligraf today, will apply next week after antibiotic therapy has been initiated. She admits today that she has resumed taking a shower with her foot/toe submerged in water, she has been reminded to keep foot/toe out of the bath water. She will be seen in follow up next week 06/21/18-she is seen in follow-up evaluation for left great toe ulcer. She is tolerating antibiotic therapy with no GI disturbance. The wound is stable. Apligraf was applied today. She has been decreasing her smoking, only had 4 cigarettes yesterday and 1 today. She continues being more compliant in diabetic diet. She will follow-up next week for evaluation of site, if stable will remove at 2 weeks. 06/28/18- She is here in follow up evalution. Apligraf was placed last week, she states the dressing fell off on Tuesday and she was dressing with hydrofera blue. She is healed and will be discharged from the clinic today. She has been instructed to continue with smoking cessation, continue monitoring glucose levels, offloading for an additional 4 weeks and continue with hydrofera blue for additional two weeks for any possible microscopic opening. Readmission: 08/07/18 on evaluation today patient presents for reevaluation concerning the ulcer of her  right great toe. She was previously discharged on 06/28/18 healed. Nonetheless she states that this began to show signs of drainage she subsequently went to her primary care provider. Subsequently an x-ray was performed on 08/01/18  which was negative. The patient was also placed on antibiotics at that time. Fortunately they should have been effective for the infection. Nonetheless she's been experiencing some improvement but still has a lot of drainage coming from the wound itself. 08/14/18 on evaluation today patient's wound actually does show signs of improvement in regard to the erythema at this point. She has completed the antibiotics. With that being said we did discuss the possibility of placing her in a total contact cast as of today although I think that I may want to give this just a little bit more time to ensure nothing recurrence as far as her infection is concerned. I do not want to put in the cast and risk infection at that time if things are not completely resolved. With that being said she is gonna require some debridement today. 08/21/18 on evaluation today patient actually appears to be doing okay in regard to her toe ulcer. She's been tolerating the dressing changes without complication. With that being said it does appear that she is ready and in fact I think it's appropriate for [place] to go ahead and initiate the total contact cast today. Nonetheless she will require some sharp debridement to prepare the wound for application. Overall I feel like things have been progressing well but we do need to do something to get this to close more readily. 08/24/18 patient seen today for reevaluation after having had the total contact cast applied on Tuesday. She seems to have done very well the wound appears to be doing great and overall I'm pleased with the progress that she's made. There were no abnormal areas of rubbing from the cast on her lower extremity. 08/30/18 on evaluation today patient actually appears to be completely healed in regard to her plantar toe ulcer. She tells me at this point she's been having a lot of issues with the cast. She almost fell a couple of times the state shall the step of her dog a couple  times as well. This is been a very frustrating process for her other nonetheless she has completely healed the wound which is excellent news. Overall there does not appear to be the evidence of infection at this time which is great news. 09/11/18 evaluation today patient presents for follow-up concerning her great toe ulcer on the left which has unfortunately reopened since I last saw her which was only a couple of weeks ago. Unfortunately she was not able to get in to get the shoe and potentially the AFO that's gonna be necessary due to her left foot drop. She continues with offloading shoe but this is not enough to prevent her from reopening it appears. When we last had her in the total contact cast she did well from a healing standpoint but unfortunately the wound reopened as soon as she came out of the cast within just a couple of weeks. 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 This is also part of the reason why she falls on a regular basis. I just do not believe that is [place]  be safe for [place] to reinitiate the total contact cast as last time we had this on she fell 3 times one week which is definitely not normal for her. 09/18/18 upon evaluation today the patient actually appears to be doing about the same in regard to her toe ulcer. She did not contact [employer] as I asked her to even though I had given her the prescription. In fact she actually states that she has no idea where the prescription is. She did apparently call [employer] and they told her that all she needed to do was bring the prescription in order to be able to be seen and work on getting the AFO for her left foot. With all that being said she still does not have an appointment and I'm not sure were things stand that regard. I will  give her a new prescription today in order to contact them to get this set up. 09/25/18 on evaluation today patient actually appears to be doing about the same in regard to her toes ulcer. She does have a small areas which seems to have a lot of callous buildup around the edge of the wound which is going to need sharp debridement today. She still is waiting to be scheduled for evaluation with [employer] for possibility of an AFO. She states there supposed to call her tomorrow to get this set up. Unfortunately it does appear that her foot specifically the toe area is showing signs of erythema. There does not appear to be any systemic infection which is in these good news. MEIAH, OLOFSON A. ([id number]) 10/02/18 on evaluation today patient actually appears to be doing about the same in regard to her toe ulcer. This really has not done too well although it's not significantly larger it's also not significantly smaller. She has been tolerating the dressing changes without complication. She actually has her appointment with [employer] and [place] tomorrow to hopefully be measured for obtaining and AFO splint. I think this would be helpful preventing this from reoccurring. We had contemplated starting the cast this week although to be honest I am reluctant to do that as she's been having nausea, vomiting, and seizure activity over the past three days. She has a history of seizures and have been told is nothing that can be done for these. With that being said I do believe that along with the seizures have the nausea vomiting which upon further questioning doesn't seem to be the normal for her and makes me concerned for the possibility of infection or something else going on. I discussed this with the patient and her mother during the office visit today. I do not feel the wound is effective but maybe something else. The responses this was "this just happens to her at times and we don't know why". They did not seem  to be interested in going to the hospital to have this checked out further. 10/09/18 on evaluation today patient presents for follow-up concerning her ongoing toe ulcer. She has been tolerating the dressing changes without complication. Fortunately there does not appear to be any evidence of infection which is great news however I do think that the patient would benefit from going ahead for with the total contact cast. She's actually in a wheelchair today she tells me that she will use her walker if we initiate the cast. I was very specific about the fact that if we were gonna do the cast I wanted to make sure that she was using the walker in  order to prevent any falls. She tells me she does not have stairs that she has to traverse on a regular basis at her home. She has not had any seizures since last week again that something that happens to her often she tells me she did talk to [employer] and they said that it may take up to three weeks to get the brace approved for her. Hopefully that will not take that long but nonetheless in the meantime I do think the cast could be of benefit. 10/12/18 on evaluation today patient appears to be doing rather well in regard to her toe ulcer. It's just been a few days and already this is significantly improved both as far as overall appearance and size. Fortunately there's no sign of infection. She is here for her first obligatory cast change. 10/19/18 Seen today for follow up and management of left great toe ulcer. Wound continues to show improvement. Noted small open area with seroussang drainage with palpation. Denies any increased pain or recent fevers during visit. She will continue calcium alginate with offloading shoe. Denies any questions or concerns during visit. 10/26/18 on evaluation today patient appears to be doing about the same as when I last saw her in regard to her wound bed. Fortunately there does not appear to be any signs of infection.  Unfortunately she continues to have a breakdown in regard to the toe region any time that she is not in the cast. It takes almost no time at all for this to happen. Nonetheless she still has not heard anything from the brace being made by [employer] as to when exactly this will be available to her. Fortunately there is no signs of infection at this time. 10/30/18 on evaluation today patient presents for application of the total contact cast as we just received him this morning. Fortunately we are gonna be able to apply this to her today which is great news. She continues to have no significant pain which is good news. Overall I do feel like things have been improving while she was the cast is when she doesn't have a cast that things get worse. She still has not really heard anything from [place] regarding her brace. 11/02/18 upon evaluation today patient's wound already appears to be doing significantly better which is good news. Fortunately there does not appear to be any signs of infection also good news. Overall I do think the total contact cast as before is helping to heal this area unfortunately it's just not gonna likely keep the area closed and healed without her getting her brace at least. Again the foot drop is a significant issue for her. 11/09/18 on evaluation today patient appears to be doing excellent in regard to her toe ulcer which in fact is completely healed. Fortunately we finally got the situation squared away with the paperwork which was needed to proceed with getting her brace approved by Medicaid. I have filled that out unfortunately that information has been sent to the orthopedic office that I worked at 2 1/2 years ago and not tired Current wound care measures. Fortunately she seems to be doing very well at this time. 11/23/18 on evaluation today patient appears to be doing More Poorly Compared to Last Time I Saw Her. At [facility] She Had Completely Healed. Currently she is continuing  to have issues with reopening. She states that she just found out that the brace was approved through Medicaid now she just has to go get measured in order  to have this fitted for her and then made. Subsequently she does not have an appointment for this yet that is going to complicate things we obviously cannot put her back in the cast if we do not have everything measured because they're not gonna be able to measure her foot while she is in the cast. 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 Obviously this is unfortunate and does have me somewhat worried as well. 11/30/18 on evaluation today patient's toe ulcer actually appears to be doing fairly well. The good news is she will be getting her brace in the shoes next week on Wednesday. Hopefully we will be able to get this to heal without having to go back in the cast however she may need the cast in order to get the wound completely heal and then go from there. Fortunately there's no signs of infection at this time. 12/07/18 on evaluation today patient fortunately did receive her brace and she states she could tell this definitely makes her walk better. With that being said she's been having issues with her toe where she noticed yesterday there was a lot of tissue that was loosing off this appears to be much larger than what it was previous. She also states that her leg has been read putting much across the top of her foot just about the ankle although this seems to be receiving somewhat. The total area is still red and appears to be someone infected as best I can tell. She is previously taken Bactrim and that may be a good option for her today as well. We are gonna see what I wound culture shows as well and I think that this is definitely appropriate. With that being said outside of the culture I still need to initiate something in the interim and that's  what I'm gonna go ahead and select Bactrim is a good option for her. 12/14/18 on evaluation today patient appears to be doing better in regard to her left great toe ulcer as compared to last week's evaluation. There's still some erythema although this is significantly improved which is excellent news. Overall I do believe that she is making good progress is still gonna take some time before she is where I would like her to be from the standpoint of being able to place her back into the total contact cast. Hopefully we will be where we need to be by next week. 12/21/18 on evaluation today patient actually appears to be doing poorly in regard to her toe ulcer. She's been tolerating the dressing changes without complication. Fortunately there's no signs of systemic infection although she does have a lot of drainage from the toe ulcer and this does seem to be causing some issues at this point. She does have erythema on the distal portion of her toe that appears to be likely cellulitis. 12/28/18 on evaluation today patient actually appears to be doing a little better in my pinion in regard to her toe ulcer. With that being said she still does have some evidence of infection at this time and for her culture she had both E. coli as well as enterococcus as organisms noted on evaluation. For that reason I think that though the Keflex likely has treated the E. coli rather well this has really done nothing for the enterococcus. We are going to have to initiate treatment for this specifically.  ORLEAN, DOUSE A. ([id number]) 01/04/19 on evaluation today patient's toe actually appears to be doing better from the standpoint of infection. She currently would like to see about putting the cash back on I think that this is appropriate as long as she takes care of it and keeps it from getting wet. She is gonna have some drainage we can definitely pass this up with Drawtex and alginate to try to prevent as much drainage as  possible from causing the problems. With that being said I do want to at least try her with the cast between now and Tuesday. If there any issues we can't continue to use it then I will discontinue the use of the cast at that point. 01/08/19 on evaluation today patient actually appears to be doing very well as far as her foot ulcer specifically the great toe on the left is concerned. She did have an area of rubbing on the medial aspect of her left ankle which again is from the cast. Fortunately there's no signs of infection at this point in this appears to be a very slight skin breakdown. The patient tells me she felt it rubbing but didn't think it was that bad. Fortunately there is no signs of active infection at this time which is good news. No fevers, chills, nausea, or vomiting noted at this time. 01/15/19 on evaluation today patient actually appears to be doing well in regard to her toe ulcer. Again as previous she seems to do well and she has the cast on which indicates to me that during the time she doesn't have a cast on she's putting way too much pressure on this region. Obviously I think that's gonna be an issue as with the current national emergency concerning the Covid-19 Virus it has been recommended that we discontinue the use of total contact casting by the chief medical officer of our company, Dr. Simona Huh. The reasoning is that if a patient becomes sick and cannot come into have the cast removed they could not just leave this on for an additional two weeks. Obviously the hospitals also do not want to receive patient's who are sick into the emergency department to potentially contaminate the region and spread the Covid-19 Virus among other sick individuals within the hospital system. Therefore at this point we are suspending the use of total contact cast until the current emergency subsides. This was all discussed with the patient today as well. 01/22/19 on evaluation today patient's wound  on her left great toe appears to be doing slightly worse than previously noted last week. She tells me that she has been on this quite a bit in fact she tells me she's been awake for 38 straight hours. This is due to the fact that she's having to care for grandparents because nobody else will. She has been taking care of them for five the last seven days since I've seen her they both have dementia his is from a stroke and her grandmother's was progressive. Nonetheless she states even her mom who knows her condition and situation has only help two of those days to take care of them she's been taking care of the rest. Fortunately there does not appear to be any signs of active infection in regard to her toe at this point although obviously it doesn't look as good as it did previous. I think this is directly related to her not taking off the pressure and friction by way of taking things easy. Though I completely understand  what's going on. 01/29/19 on evaluation today patient's tools are actually appears to be showing some signs of improvement today compared to last week's evaluation as far as not necessarily the overall size of the wound but the fact that she has some new skin growth in between the two ends of the wound opening. Overall I feel like she has done well she states that she had a family member give her what sounds to be a CAM walker boot which has been helpful as well. 02/05/19 on evaluation today patient's wound bed actually appears to be doing significantly better in regard to her overall appearance of the size of the wound. With that being said she is still having an issue with offloading efficiently enough to get this to close. Apparently there is some signs of infection at this point as well unfortunately. Previously she's done well of Augmentin I really do not see anything that needs to be culture currently but there theme and cellulitis of the foot that I'm seeing I'm gonna go ahead and  place her on an antibiotic today to try to help clear this up. 02/12/2019 on evaluation today patient actually appears to be doing poorly in regard to her overall wound status. She tells me she has been using her offloading shoe but actually comes in today wearing her tennis shoe with the AFO brace. Again as I previously discussed with her this is really not sufficient to allow the area to heal appropriately. Nonetheless she continues to be somewhat noncompliant and I do wonder based on what she has told my nurse in the past as to whether or not a good portion of this noncompliance may be recreational drug and alcohol related. She has had a history of heroin overdose and this was fairly recently in the past couple of months that have been seeing her. Nonetheless overall I feel like her wound looks significantly worse today compared to what it was previous. She still has significant erythema despite the Augmentin I am not sure that this is an appropriate medication for her infection I am also concerned that the infection may have gone down into her bone. 02/19/19 on evaluation today patient actually appears to be doing about the same in regard to her toe ulcer. Unfortunately she continues to show signs of bone exposure and infection at this point. There does not appear to be any evidence of worsening of the infection but I'm also not really sure that it's getting significantly better. She is on the Augmentin which should be sufficient for the Staphylococcus aureus infection that she has at this point. With that being said she may need IV antibiotics to more appropriately treat this. We did have a discussion today about hyperbaric option therapy. 02/28/19 on evaluation today patient actually appears to be doing much worse in regard to the wound on her left great toe as compared to even my previous evaluation last week. Unfortunately this seems to be training in a pretty poor direction. Her toe was actually  now starting to angle laterally and I can actually see the entire joint area of the proximal portion of the digit where is the distal portion of the digit again is no longer even in contact with the joint line. Unfortunately there's a lot more necrotic tissue around the edge and the toe appears to be showing signs of becoming gangrenous in my pinion. I'm very concerned about were things stand at this point. She did see infectious disease and they are planning  to send in a prescription for Sivextro for her and apparently this has been approved. With that being said I don't think she should avoid taking this but at the same time I'm not sure that it's gonna be sufficient to save her toe at this point. 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 03/05/19 on evaluation today patient actually appears to be doing quite well in regard to her toe all things considering. She still has bone exposed but there appears to be much less your thing on overall the appearance of the wound and the toe itself is dramatically improved. She still does have some issues currently obviously with infection she did see vascular as well and there concerned that her blood flow to the toad. For that reason they are setting up for an angiogram next week. 03/14/19 on evaluation today patient appears to be doing very poor in regard to her toe and specifically in regard to the ulceration and the fact that she's starting to notice the toe was leaning even more towards the lateral aspect and the complete joint is visible on the proximal aspect of the joint. Nonetheless she's also noted a significant odor and the tip of the toe is turning more dark and necrotic appearing. Overall I think she is getting worse not better as far as this is  concerned. For that reason I am recommending at this point that she likely needs to be seen for likely amputation. READMISSION 03/19/2021 This is a patient that we cared for in this clinic for a prolonged period of time in 2019 and 2020 with a left foot and left first toe wound. 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 ([id number]) 04/09/20 by Dr. Luana Shu. In December 2021 she had an ulcer on her right great toe as well as the fourth and fifth toes. 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 She also had an angiogram at that time and underwent angioplasty of the right anterior tibial artery. In any case she claims that the wound on the right foot is closed I did not look at this today which was probably an oversight although I think that should be done next week. After her surgery she developed a dehiscence but I do not see any follow-up of this. According to Dr. Deborra Medina last review that she was out of the area being cared for by another physician but recently came back to his attention. 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 A culture of this area showed E. coli apparently before she came back to see Dr. Luana Shu she was supposed to be receiving antibiotics but she did not really take them. Nor is she offloading this area at all. Finally her last hemoglobin A1c listed in epic was in March 2022 at 14.1 she says things are a lot better since then although I am not sure. 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 She had this wound at the time. During this admission she had E. coli sepsis a TEE was negative. Past medical history is extensive and includes  type 2 diabetes with peripheral neuropathy cardiomyopathy with an ejection fraction of  33%, hypertension, hyperlipidemia chronic renal failure stage III history of substance abuse with cocaine although she claims to be clean now verified by her mother. She is still a heavy cigarette smoker. She has a history of bipolar disorder seizure disorder ABI in our clinic was 1.05 Electronic Signature(s) Signed: 03/19/2021 4:35:25 PM By: Linton Ham MD Entered By: Linton Ham on 03/19/2021 16:08:33 [name] ([id number]) -------------------------------------------------------------------------------- Physical Exam Details Patient Name: [name] Date of Service: 03/19/2021 2:30 PM Medical Record Number: [id number] Patient Account Number: [id number] Date of Birth/Sex: 12/03/1975 (45 y.o. F) Treating RN: Carlene Coria Primary Care Provider: Raelene Bott Other Clinician: Referring Provider: Caroline More Treating Provider/Extender: Tito Dine in Treatment: 0 Constitutional Sitting or standing Blood Pressure is within target range for patient.. Pulse regular and within target range for patient.Marland Kitchen Respirations regular, non- labored and within target range.. Temperature is normal and within the target range for the patient.Marland Kitchen appears in no distress. Respiratory Respiratory effort is easy and symmetric bilaterally. Rate is normal at rest and on room air.. Cardiovascular Pedal pulses on the left foot palpable. Musculoskeletal Left transmetatarsal amputation. Notes Wound exam; the area we are looking at is in the left midfoot proximal to the metatarsals. Lifeless, punched out wound with callus and thick skin around the edges. In the middle of this there was a raised area not hyper granulated. This was not even granulation tissue. ED easily probed down deeper into the midfoot. After discussing the approach to this with the patient and getting some sense that she wanted to move forward aggressively I used a #5 curette to remove the callus and thick skin from  the edges and some tissue from the lifeless surface circumference of the wound however the middle part of the wound was spongy and nonviable. I removed some of this as well this probes easily deeply but there is no purulence and no palpable bone. There is no erythema around the wound Electronic Signature(s) Signed: 03/19/2021 4:35:25 PM By: Linton Ham MD Entered By: Linton Ham on 03/19/2021 16:11:24 [name] ([id number]) -------------------------------------------------------------------------------- Physician Orders Details Patient Name: [name] Date of Service: 03/19/2021 2:30 PM Medical Record Number: [id number] Patient Account Number: [id number] Date of Birth/Sex: 1976-01-26 (45 y.o. F) Treating RN: Carlene Coria Primary Care Provider: Raelene Bott Other Clinician: Referring Provider: Caroline More Treating Provider/Extender: Tito Dine in Treatment: 0 Verbal / Phone Orders: No Diagnosis Coding Follow-up Appointments o Return Appointment in 1 week. Bathing/ Shower/ Hygiene o Clean wound with Normal Saline or wound cleanser. Wound Treatment Wound #7 - Foot Wound Laterality: Plantar, Left, Midline Cleanser: Soap and Water 3 x Per Week/30 Days Discharge Instructions: Gently cleanse wound with antibacterial soap, rinse and pat dry prior to dressing wounds Primary Dressing: Prisma 4.34 (in) 3 x Per Week/30 Days Discharge Instructions: Moisten with hydrogel Secondary Dressing: Bordered Gauze Sterile-HBD 4x4 (in/in) 3 x Per Week/30 Days Discharge Instructions: Cover wound with Bordered Guaze Sterile as directed Electronic Signature(s) Signed: 03/19/2021 4:35:25 PM By: Linton Ham MD Signed: 03/23/2021 3:50:09 PM By: Carlene Coria RN Entered By: Carlene Coria on 03/19/2021 15:35:40 Hickmon, Mila A. ([id number]) -------------------------------------------------------------------------------- Problem List Details Patient Name: Nile Dear  A. Date of Service: 03/19/2021 2:30 PM Medical Record Number: [id number] Patient Account Number: [id number] Date of Birth/Sex: January 23, 1976 (45 y.o. F) Treating RN: Carlene Coria Primary Care Provider: Raelene Bott Other Clinician: Referring Provider: Caroline More Treating  Provider/Extender: Ricard [name] Weeks in Treatment: 0 Active Problems ICD-10 Encounter Code Description Active Date MDM Diagnosis E11.621 Type 2 diabetes mellitus with foot ulcer 03/19/2021 No Yes 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 03/19/2021 No Yes severity E11.42 Type 2 diabetes mellitus with diabetic polyneuropathy 03/19/2021 No Yes Inactive Problems Resolved Problems Electronic Signature(s) Signed: 03/19/2021 4:35:25 PM By: Linton Ham MD Entered By: Linton Ham on 03/19/2021 15:56:19 Mccurley, Nalea A. ([id number]) -------------------------------------------------------------------------------- Progress Note Details Patient Name: [name] Date of Service: 03/19/2021 2:30 PM Medical Record Number: [id number] Patient Account Number: [id number] Date of Birth/Sex: 1975-11-05 (45 y.o. F) Treating RN: Carlene Coria Primary Care Provider: Raelene Bott Other Clinician: Referring Provider: Caroline More Treating Provider/Extender: Tito Dine in Treatment: 0 Subjective Chief Complaint Information obtained from Patient Left great toe ulcer 03/19/2021; patient referred by Dr. Luana Shu who has been looking after her left foot for quite a period of time for review of a nonhealing area in the left midfoot History of Present Illness (HPI) 01/18/18-She is here for initial evaluation of the left great toe ulcer. She is a poor historian in regards to timeframe in detail. She states approximately 4 weeks ago she lacerated her toe on something in the house. She followed up with her primary care who placed her on Bactrim and ultimately a second dose of  Bactrim prior to coming to wound clinic. She states she has been treating the toe with peroxide, Betadine and a Band-Aid. She did not check her blood sugar this morning but checked it yesterday morning it was 327; she is unaware of a recent A1c and there are no current records. She saw Dr. she would've orthopedics last week for an old injury to the left ankle, she states he did not see her toe, nor did she bring it to his attention. She smokes approximately 1 pack cigarettes a day. Her social situation is concerning, she arrives this morning with her mother who appears extremely intoxicated/under the influence; her mother was asked to leave the room and be monitored by the patient's grandmother. The patient's aunt then accompanied the patient and the room throughout the rest of the appointment. We had a lengthy discussion regarding the deleterious effects of uncontrolled hyperglycemia and smoking as it relates to wound healing and overall health. She was strongly encouraged to decrease her smoking and get her diabetes under better control. She states she is currently on a diet and has cut down her [facility] consumption. The left toe is erythematous, macerated and slightly edematous with malodor present. The edema in her left foot is below her baseline, there is no erythema streaking. We will treat her with Santyl, doxycycline; we have ordered and xray, culture and provided a Peg assist surgical shoe and cultured the wound. 01/25/18-She is here in follow-up evaluation for a left great toe ulcer and presents with an abscess to her suprapubic area. She states her blood sugars remain elevated, feeling "sick" and if levels are below 250, but she is trying. She has made no attempt to decrease her smoking stating that we "can't take away her food in her cigarettes". She has been compliant with offloading using the PEG assist you. She is using Santyl daily. the culture obtained last week grew staph aureus and  Enterococcus faecalis; continues on the doxycycline and Augmentin was added on Monday. The suprapubic area has erythema, no femoral variation, purple discoloration, minimal induration, was accessed with a cotton tip  applicator with sanguinopurulent drainage, this was cultured, I suspect the current antibiotic treatment will cover and we will not add anything to her current treatment plan. She was advised to go to urgent care or ER with any change in redness, induration or fever. 02/01/18-She is here in follow-up evaluation for left great toe ulcers and a new abdominal abscess from last week. She was able to use packing until earlier this week, where she "forgot it was there". She states she was feeling ill with GI symptoms last week and was not taking her antibiotic. She states her glucose levels have been predominantly less than 200, with occasional levels between 200-250. She thinks this was contributing to her GI symptoms as they have resolved without intervention. There continues to be significant laceration to left toe, otherwise it clinically looks stable/improved. There is now less superficial opening to the lateral aspect of the great toe that was residual blister. We will transition to [facility] to all wounds, she will continue her Augmentin. If there is no change or deterioration next week for reculture. 02/08/18-She is here in follow-up evaluation for left great toe ulcer and abdominal ulcer. There is an improvement in both wounds. She has been wrapping her left toe with coban, not by our direction, which has created an area of discoloration to the medial aspect; she has been advised to NOT use coban secondary to her neuropathy. She states her glucose levels have been high over this last week ranging from 200-350, she continues to smoke. She admits to being less compliant with her offloading shoe. We will continue with same treatment plan and she will follow-up next week. 02/15/18-She is  here in follow-up evaluation for left great toe ulcer and abdominal ulcer. The abdominal ulcer is epithelialized. The left great toe ulcer is improved and all injury from last week using the Coban wrap is resolved, the lateral ulcer is healed. She admits to noncompliance with wearing offloading shoe and admits to glucose levels being greater than 300 most of the week. She continues to smoke and expresses no desire to quit. There is one area medially that probes deeper than it has historically, erythema to the toe and dorsal foot has consistently waxed and waned. There is no overt signs of cellulitis or infection but we will culture the wound for any occult infection given the new area of depth and erythema. We will hold off on sensitivities for initiation of antibiotic therapy. 02/22/18-She is here in follow up evaluation for left great toe ulcer. There is overall significant improvement in both wound appearance, erythema and edema with changes made last week. She was not initiated on antibiotic therapy. Culture obtained last week showed oxacillin sensitive staph aureus, sensitive to clindamycin. Clindamycin has been called into the pharmacy but she has been instructed to hold off on initiation secondary to overall clinical improvement and her history of antibiotic intolerance. She has been instructed to contact the clinic with any noted changes/deterioration and the wound, erythema, edema and/or pain. She will follow-up next week. She continues to smoke and her glucose levels remain elevated >250; she admits to compliance with offloading shoe 03/01/18 on evaluation today patient appears to be doing fairly well in regard to her left first toe ulcer. She has been tolerating the dressing changes with the [facility] without complication and overall this has definitely showed signs of improvement according to records as well is what the patient tells me today. I'm very pleased in that regard. She  is having no pain today 03/08/18 She is here for follow up evaluation of a left great toe ulcer. She remains non-compliant with glucose control and smoking cessation; glucose levels consistently >200. She states that she got new shoe inserts/peg assist. She admits to compliance with offloading. Since my last evaluation there is significant improvement. We will switch to prisma at this time and she will follow up next week. She is noted to be tachycardic at this appointment, heart rate 120s; she has a history of heart rate 70-130 according to our records. She admits to extreme agitation r/t personal issues; she was advised to monitor her heartrate and contact her physician if it does not return to a more normal range (<100). She takes cardizem twice daily. 03/15/18-She is here in follow-up evaluation for left great toe ulcer. She remains noncompliant with glucose control and smoking cessation. She admits to compliance with wearing offloading shoe. The ulcer is improved/stable and we will continue with the same treatment plan and she will follow-up next week 03/22/18-She is here for evaluation for left great toe ulcer. There continues to be significant improvement despite recurrent hyperglycemia (over 500 yesterday) and she continues to smoke. She has been compliant with offloading and we will continue with same treatment plan and she will follow-up next week. Rinks, Toma A. ([id number]) 03/29/18-She is here for evaluation for left great toe ulcer. Despite continuing to smoke and uncontrolled diabetes she continues to improve. She is compliant with offloading shoe. We will continue with the same treatment plan and she will follow-up next week 04/05/18- She is here in follow up evaluation for a left great toe ulcer; she presents with small pustule to left fifth toe (resembles ant bite). She admits to compliance with wearing offloading shoe; continues to smoke or have uncontrolled blood glucose control.  There is more callus than usual with evidence of bleeding; she denies known trauma. 04/12/18-She is here for evaluation of left great toe ulcer. Despite noncompliance with glycemic control and smoking she continues to make improvement. She continues to wear offloading shoe. The pustule, that was identified last week, to the left fifth toe is resolved. She will follow-up in 2 weeks 05/03/18-she is seen in follow-up evaluation for a left great toe ulcer. She is compliant with offloading, otherwise noncompliant with glycemic control and smoking. She has plateaued and there is minimal improvement noted. We will transition to [facility], replaced the insert to her surgical shoe and she will follow-up in one week 05/10/18- She is here in follow up evaluation for a left great toe ulcer. It appears stable despite measurement change. We will continue with same treatment plan and follow up next week. 05/24/18-She is seen in follow-up evaluation for a left great toe ulcer. She remains compliant with offloading, has made significant improvement in her diet, decreasing the amount of sugar/soda. She said her recent A1c was 10.9 which is lower than. She did see a diabetic nutritionist/educator yesterday. She continues to smoke. We will continue with the same treatment plan and she'll follow-up next week. 05/31/18- She is seen in follow-up evaluation for left great toe ulcer. She continues to remain compliant with offloading, continues to make improvement in her diet, increasing her water and decreasing the amount of sugar/soda. She does continue to smoke with no desire to quit. We will apply Prisma to the depth and Hydrofera Blue over. We have not received insurance authorization for oasis. She will follow up next week. 06/07/18-She is seen in follow-up evaluation for  left great toe ulcer. It has stalled according to today's measurements although base appears stable. She says she saw a diabetic educator yesterday; her  average blood sugars are less than 300 which is an improvement for her. She continues to smoke and states "that's my next step" She continues with water over soda. We will order for xray, culture and reinstate ace wrap compression prior to placing apligraf for next week. She is voicing no complaints or concerns. Her dressing will change to iodoflex over the next week in preparation for apligraf. 06/14/18-She is seen in follow-up evaluation for left great toe ulcer. Plain film x-ray performed last week was negative for osteomyelitis. Wound culture obtained last week grew strep B and OSSA; she is initiated on keflex and cefdinir today; there is erythema to the toe which could be from ace wrap compression, she has a history of wrapping too tight and has has been encouraged to maintain ace wraps that we place today. We will hold off on application of apligraf today, will apply next week after antibiotic therapy has been initiated. She admits today that she has resumed taking a shower with her foot/toe submerged in water, she has been reminded to keep foot/toe out of the bath water. She will be seen in follow up next week 06/21/18-she is seen in follow-up evaluation for left great toe ulcer. She is tolerating antibiotic therapy with no GI disturbance. The wound is stable. Apligraf was applied today. She has been decreasing her smoking, only had 4 cigarettes yesterday and 1 today. She continues being more compliant in diabetic diet. She will follow-up next week for evaluation of site, if stable will remove at 2 weeks. 06/28/18- She is here in follow up evalution. Apligraf was placed last week, she states the dressing fell off on Tuesday and she was dressing with hydrofera blue. She is healed and will be discharged from the clinic today. She has been instructed to continue with smoking cessation, continue monitoring glucose levels, offloading for an additional 4 weeks and continue with hydrofera blue for  additional two weeks for any possible microscopic opening. Readmission: 08/07/18 on evaluation today patient presents for reevaluation concerning the ulcer of her right great toe. She was previously discharged on 06/28/18 healed. Nonetheless she states that this began to show signs of drainage she subsequently went to her primary care provider. Subsequently an x-ray was performed on 08/01/18 which was negative. The patient was also placed on antibiotics at that time. Fortunately they should have been effective for the infection. Nonetheless she's been experiencing some improvement but still has a lot of drainage coming from the wound itself. 08/14/18 on evaluation today patient's wound actually does show signs of improvement in regard to the erythema at this point. She has completed the antibiotics. With that being said we did discuss the possibility of placing her in a total contact cast as of today although I think that I may want to give this just a little bit more time to ensure nothing recurrence as far as her infection is concerned. I do not want to put in the cast and risk infection at that time if things are not completely resolved. With that being said she is gonna require some debridement today. 08/21/18 on evaluation today patient actually appears to be doing okay in regard to her toe ulcer. She's been tolerating the dressing changes without complication. With that being said it does appear that she is ready and in fact I think it's appropriate for [place]  to go ahead and initiate the total contact cast today. Nonetheless she will require some sharp debridement to prepare the wound for application. Overall I feel like things have been progressing well but we do need to do something to get this to close more readily. 08/24/18 patient seen today for reevaluation after having had the total contact cast applied on Tuesday. She seems to have done very well the wound appears to be doing great and  overall I'm pleased with the progress that she's made. There were no abnormal areas of rubbing from the cast on her lower extremity. 08/30/18 on evaluation today patient actually appears to be completely healed in regard to her plantar toe ulcer. She tells me at this point she's been having a lot of issues with the cast. She almost fell a couple of times the state shall the step of her dog a couple times as well. This is been a very frustrating process for her other nonetheless she has completely healed the wound which is excellent news. Overall there does not appear to be the evidence of infection at this time which is great news. 09/11/18 evaluation today patient presents for follow-up concerning her great toe ulcer on the left which has unfortunately reopened since I last saw her which was only a couple of weeks ago. Unfortunately she was not able to get in to get the shoe and potentially the AFO that's gonna be necessary due to her left foot drop. She continues with offloading shoe but this is not enough to prevent her from reopening it appears. When we last had her in the total contact cast she did well from a healing standpoint but unfortunately the wound reopened as soon as she came out of the cast within just a couple of weeks. 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 This is also part of the reason why she falls on a regular basis. I just do not believe that is gonna be safe for [place] to reinitiate the total contact cast as last time we had this on she fell 3 times one week which is definitely not normal for her. 09/18/18 upon evaluation today the patient actually appears to be doing about the same in regard to her toe ulcer. She did not contact [facility] as I asked her to even though I had given her  the prescription. In fact she actually states that she has no idea where the prescription is. She did apparently call [facility] and they told her that all she needed to do was bring the prescription in order to be able to be seen and work on getting the AFO for her left foot. With all that being said she still does not have an appointment and I'm not sure were things stand that regard. I will give her a new prescription today in order to contact them to get this set up. LAKEYDA, BAZZLE A. ([id number]) 09/25/18 on evaluation today patient actually appears to be doing about the same in regard to her toes ulcer. She does have a small areas which seems to have a lot of callous buildup around the edge of the wound which is going to need sharp debridement today. She still is waiting to be scheduled for evaluation with [facility] for possibility of an  AFO. She states there supposed to call her tomorrow to get this set up. Unfortunately it does appear that her foot specifically the toe area is showing signs of erythema. There does not appear to be any systemic infection which is in these good news. 10/02/18 on evaluation today patient actually appears to be doing about the same in regard to her toe ulcer. This really has not done too well although it's not significantly larger it's also not significantly smaller. She has been tolerating the dressing changes without complication. She actually has her appointment with [facility] and [place] tomorrow to hopefully be measured for obtaining and AFO splint. I think this would be helpful preventing this from reoccurring. We had contemplated starting the cast this week although to be honest I am reluctant to do that as she's been having nausea, vomiting, and seizure activity over the past three days. She has a history of seizures and have been told is nothing that can be done for these. With that being said I do believe that along with the seizures have the nausea vomiting  which upon further questioning doesn't seem to be the normal for her and makes me concerned for the possibility of infection or something else going on. I discussed this with the patient and her mother during the office visit today. I do not feel the wound is effective but maybe something else. The responses this was "this just happens to her at times and we don't know why". They did not seem to be interested in going to the hospital to have this checked out further. 10/09/18 on evaluation today patient presents for follow-up concerning her ongoing toe ulcer. She has been tolerating the dressing changes without complication. Fortunately there does not appear to be any evidence of infection which is great news however I do think that the patient would benefit from going ahead for with the total contact cast. She's actually in a wheelchair today she tells me that she will use her walker if we initiate the cast. I was very specific about the fact that if we were gonna do the cast I wanted to make sure that she was using the walker in order to prevent any falls. She tells me she does not have stairs that she has to traverse on a regular basis at her home. She has not had any seizures since last week again that something that happens to her often she tells me she did talk to [employer] and they said that it may take up to three weeks to get the brace approved for her. Hopefully that will not take that long but nonetheless in the meantime I do think the cast could be of benefit. 10/12/18 on evaluation today patient appears to be doing rather well in regard to her toe ulcer. It's just been a few days and already this is significantly improved both as far as overall appearance and size. Fortunately there's no sign of infection. She is here for her first obligatory cast change. 10/19/18 Seen today for follow up and management of left great toe ulcer. Wound continues to show improvement. Noted small open area  with seroussang drainage with palpation. Denies any increased pain or recent fevers during visit. She will continue calcium alginate with offloading shoe. Denies any questions or concerns during visit. 10/26/18 on evaluation today patient appears to be doing about the same as when I last saw her in regard to her wound bed. Fortunately there does not appear to be any signs  of infection. Unfortunately she continues to have a breakdown in regard to the toe region any time that she is not in the cast. It takes almost no time at all for this to happen. Nonetheless she still has not heard anything from the brace being made by [employer] as to when exactly this will be available to her. Fortunately there is no signs of infection at this time. 10/30/18 on evaluation today patient presents for application of the total contact cast as we just received him this morning. Fortunately we are gonna be able to apply this to her today which is great news. She continues to have no significant pain which is good news. Overall I do feel like things have been improving while she was the cast is when she doesn't have a cast that things get worse. She still has not really heard anything from [facility] regarding her brace. 11/02/18 upon evaluation today patient's wound already appears to be doing significantly better which is good news. Fortunately there does not appear to be any signs of infection also good news. Overall I do think the total contact cast as before is helping to heal this area unfortunately it's just not gonna likely keep the area closed and healed without her getting her brace at least. Again the foot drop is a significant issue for her. 11/09/18 on evaluation today patient appears to be doing excellent in regard to her toe ulcer which in fact is completely healed. Fortunately we finally got the situation squared away with the paperwork which was needed to proceed with getting her brace approved by Medicaid. I have  filled that out unfortunately that information has been sent to the orthopedic office that I worked at 2 1/2 years ago and not tired Current wound care measures. Fortunately she seems to be doing very well at this time. 11/23/18 on evaluation today patient appears to be doing More Poorly Compared to Last Time I Saw Her. At [facility] She Had Completely Healed. Currently she is continuing to have issues with reopening. She states that she just found out that the brace was approved through Medicaid now she just has to go get measured in order to have this fitted for her and then made. Subsequently she does not have an appointment for this yet that is going to complicate things we obviously cannot put her back in the cast if we do not have everything measured because they're not gonna be able to measure her foot while she is in the cast. 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 Obviously this is unfortunate and does have me somewhat worried as well. 11/30/18 on evaluation today patient's toe ulcer actually appears to be doing fairly well. The good news is she will be getting her brace in the shoes next week on Wednesday. Hopefully we will be able to get this to heal without having to go back in the cast however she may need the cast in order to get the wound completely heal and then go from there. Fortunately there's no signs of infection at this time. 12/07/18 on evaluation today patient fortunately did receive her brace and she states she could tell this definitely makes her walk better. With that being said she's been having issues with her toe where she noticed yesterday there was a lot of tissue that was loosing off this appears to be much larger  than what it was previous. She also states that her leg has been read putting much across the top of her foot just about the ankle although this seems to be  receiving somewhat. The total area is still red and appears to be someone infected as best I can tell. She is previously taken Bactrim and that may be a good option for her today as well. We are gonna see what I wound culture shows as well and I think that this is definitely appropriate. With that being said outside of the culture I still need to initiate something in the interim and that's what I'm gonna go ahead and select Bactrim is a good option for her. 12/14/18 on evaluation today patient appears to be doing better in regard to her left great toe ulcer as compared to last week's evaluation. There's still some erythema although this is significantly improved which is excellent news. Overall I do believe that she is making good progress is still gonna take some time before she is where I would like her to be from the standpoint of being able to place her back into the total contact cast. Hopefully we will be where we need to be by next week. 12/21/18 on evaluation today patient actually appears to be doing poorly in regard to her toe ulcer. She's been tolerating the dressing changes without complication. Fortunately there's no signs of systemic infection although she does have a lot of drainage from the toe ulcer and this does Hiltz, Kiandria A. ([id number]) seem to be causing some issues at this point. She does have erythema on the distal portion of her toe that appears to be likely cellulitis. 12/28/18 on evaluation today patient actually appears to be doing a little better in my pinion in regard to her toe ulcer. With that being said she still does have some evidence of infection at this time and for her culture she had both E. coli as well as enterococcus as organisms noted on evaluation. For that reason I think that though the Keflex likely has treated the E. coli rather well this has really done nothing for the enterococcus. We are going to have to initiate treatment for this  specifically. 01/04/19 on evaluation today patient's toe actually appears to be doing better from the standpoint of infection. She currently would like to see about putting the cash back on I think that this is appropriate as long as she takes care of it and keeps it from getting wet. She is gonna have some drainage we can definitely pass this up with Drawtex and alginate to try to prevent as much drainage as possible from causing the problems. With that being said I do want to at least try her with the cast between now and Tuesday. If there any issues we can't continue to use it then I will discontinue the use of the cast at that point. 01/08/19 on evaluation today patient actually appears to be doing very well as far as her foot ulcer specifically the great toe on the left is concerned. She did have an area of rubbing on the medial aspect of her left ankle which again is from the cast. Fortunately there's no signs of infection at this point in this appears to be a very slight skin breakdown. The patient tells me she felt it rubbing but didn't think it was that bad. Fortunately there is no signs of active infection at this time which is good news. No fevers, chills, nausea, or  vomiting noted at this time. 01/15/19 on evaluation today patient actually appears to be doing well in regard to her toe ulcer. Again as previous she seems to do well and she has the cast on which indicates to me that during the time she doesn't have a cast on she's putting way too much pressure on this region. Obviously I think that's gonna be an issue as with the current national emergency concerning the Covid-19 Virus it has been recommended that we discontinue the use of total contact casting by the chief medical officer of our company, Dr. Simona Huh. The reasoning is that if a patient becomes sick and cannot come into have the cast removed they could not just leave this on for an additional two weeks. Obviously the hospitals also  do not want to receive patient's who are sick into the emergency department to potentially contaminate the region and spread the Covid-19 Virus among other sick individuals within the hospital system. Therefore at this point we are suspending the use of total contact cast until the current emergency subsides. This was all discussed with the patient today as well. 01/22/19 on evaluation today patient's wound on her left great toe appears to be doing slightly worse than previously noted last week. She tells me that she has been on this quite a bit in fact she tells me she's been awake for 38 straight hours. This is due to the fact that she's having to care for grandparents because nobody else will. She has been taking care of them for five the last seven days since I've seen her they both have dementia his is from a stroke and her grandmother's was progressive. Nonetheless she states even her mom who knows her condition and situation has only help two of those days to take care of them she's been taking care of the rest. Fortunately there does not appear to be any signs of active infection in regard to her toe at this point although obviously it doesn't look as good as it did previous. I think this is directly related to her not taking off the pressure and friction by way of taking things easy. Though I completely understand what's going on. 01/29/19 on evaluation today patient's tools are actually appears to be showing some signs of improvement today compared to last week's evaluation as far as not necessarily the overall size of the wound but the fact that she has some new skin growth in between the two ends of the wound opening. Overall I feel like she has done well she states that she had a family member give her what sounds to be a CAM walker boot which has been helpful as well. 02/05/19 on evaluation today patient's wound bed actually appears to be doing significantly better in regard to her overall  appearance of the size of the wound. With that being said she is still having an issue with offloading efficiently enough to get this to close. Apparently there is some signs of infection at this point as well unfortunately. Previously she's done well of Augmentin I really do not see anything that needs to be culture currently but there theme and cellulitis of the foot that I'm seeing I'm gonna go ahead and place her on an antibiotic today to try to help clear this up. 02/12/2019 on evaluation today patient actually appears to be doing poorly in regard to her overall wound status. She tells me she has been using her offloading shoe but actually comes in today  wearing her tennis shoe with the AFO brace. Again as I previously discussed with her this is really not sufficient to allow the area to heal appropriately. Nonetheless she continues to be somewhat noncompliant and I do wonder based on what she has told my nurse in the past as to whether or not a good portion of this noncompliance may be recreational drug and alcohol related. She has had a history of heroin overdose and this was fairly recently in the past couple of months that have been seeing her. Nonetheless overall I feel like her wound looks significantly worse today compared to what it was previous. She still has significant erythema despite the Augmentin I am not sure that this is an appropriate medication for her infection I am also concerned that the infection may have gone down into her bone. 02/19/19 on evaluation today patient actually appears to be doing about the same in regard to her toe ulcer. Unfortunately she continues to show signs of bone exposure and infection at this point. There does not appear to be any evidence of worsening of the infection but I'm also not really sure that it's getting significantly better. She is on the Augmentin which should be sufficient for the Staphylococcus aureus infection that she has at this point.  With that being said she may need IV antibiotics to more appropriately treat this. We did have a discussion today about hyperbaric option therapy. 02/28/19 on evaluation today patient actually appears to be doing much worse in regard to the wound on her left great toe as compared to even my previous evaluation last week. Unfortunately this seems to be training in a pretty poor direction. Her toe was actually now starting to angle laterally and I can actually see the entire joint area of the proximal portion of the digit where is the distal portion of the digit again is no longer even in contact with the joint line. Unfortunately there's a lot more necrotic tissue around the edge and the toe appears to be showing signs of becoming gangrenous in my pinion. I'm very concerned about were things stand at this point. She did see infectious disease and they are planning to send in a prescription for Sivextro for her and apparently this has been approved. With that being said I don't think she should avoid taking this but at the same time I'm not sure that it's gonna be sufficient to save her toe at this point. 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 03/05/19 on evaluation today patient actually appears to be doing quite well in regard to her toe all things considering. She still has bone exposed but there appears to be much less your thing on overall the appearance of the wound and the toe itself is dramatically improved. She still does have some issues currently obviously with infection she did see vascular as well and there concerned that her blood flow to the toad. For that reason they are setting up for an angiogram next week. 03/14/19 on evaluation today patient appears to be doing very poor in regard  to her toe and specifically in regard to the ulceration and the fact that she's starting to notice the toe was leaning even more towards the lateral aspect and the complete joint is  visible on the proximal aspect of the joint. Nonetheless she's also noted a significant odor and the tip of the toe is turning more dark and necrotic appearing. Overall I think she is getting worse not better as far as this is concerned. For that reason I am recommending at this point that she likely needs to be seen for likely amputation. MYRICAL, MARTELLO ([id number]) READMISSION 03/19/2021 This is a patient that we cared for in this clinic for a prolonged period of time in 2019 and 2020 with a left foot and left first toe wound. 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 Luana Shu. In December 2021 she had an ulcer on her right great toe as well as the fourth and fifth toes. 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 She also had an angiogram at that time and underwent angioplasty of the right anterior tibial artery. In any case she claims that the wound on the right foot is closed I did not look at this today which was probably an oversight although I think that should be done next week. After her surgery she developed a dehiscence but I do not see any follow-up of this. According to Dr. Deborra Medina last review that she was out of the area being cared for by another physician but recently came back to his attention. 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 A culture of this area showed E. coli apparently before she came back to see Dr. Luana Shu she was supposed to be receiving antibiotics but she did not really take them. Nor is she offloading this area at all. Finally her last hemoglobin A1c listed in epic was in March 2022 at 14.1 she says things are a lot  better since then although I am not sure. 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 She had this wound at the time. During this admission she had E. coli sepsis a TEE was negative. 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 She is still a heavy cigarette smoker. She has a history of bipolar disorder seizure disorder ABI in our clinic was 1.05 Patient History Information obtained from Patient. Allergies erythromycin base Social History Current every day smoker, Marital Status - Single, Alcohol Use - Never, Drug Use - Prior History, Caffeine Use - Daily. Medical History Ear/Nose/Mouth/Throat Patient has history of Chronic sinus problems/congestion, Middle ear problems Hematologic/Lymphatic Patient has history of Anemia Respiratory Patient has history of Chronic Obstructive Pulmonary Disease (COPD) Cardiovascular Patient has history of Congestive Heart Failure - EF 33% Endocrine Patient has history of Type II Diabetes Genitourinary Patient has history of End Stage Renal Disease Integumentary (Skin) Patient has history of History of pressure wounds Neurologic Patient has history of Neuropathy - feel and legs Patient is treated with Insulin, Oral Agents. Blood sugar is not tested. Blood sugar results noted at the following times: Bedtime - 176. Review of Systems (ROS) Genitourinary Complains or has symptoms of Kidney failure/ Dialysis - Stage IV. Integumentary (Skin) Complains or has symptoms of Wounds. Musculoskeletal Complains or has symptoms of Muscle Weakness. Psychiatric Depression  Objective Constitutional Sitting or standing Blood  Pressure is within target range for patient.. Pulse regular and within target range for patient.Marland Kitchen Respirations regular, non- labored and within target range.. Temperature is normal and within the target range for the patient.Marland Kitchen appears in no distress. STACIE, CABOT A. ([id number]) Vitals Time Taken: 2:37 PM, Height: 69 in, Weight: 185 lbs, BMI: 27.3, Temperature: 98.6 F, Pulse: 87 bpm, Respiratory Rate: 16 breaths/min, Blood Pressure: 109/70 mmHg. Respiratory Respiratory effort is easy and symmetric bilaterally. Rate is normal at rest and on room air.. Cardiovascular Pedal pulses on the left foot palpable. Musculoskeletal Left transmetatarsal amputation. General Notes: Wound exam; the area we are looking at is in the left midfoot proximal to the metatarsals. Lifeless, punched out wound with callus and thick skin around the edges. In the middle of this there was a raised area not hyper granulated. This was not even granulation tissue. ED easily probed down deeper into the midfoot. After discussing the approach to this with the patient and getting some sense that she wanted to move forward aggressively I used a #5 curette to remove the callus and thick skin from the edges and some tissue from the lifeless surface circumference of the wound however the middle part of the wound was spongy and nonviable. I removed some of this as well this probes easily deeply but there is no purulence and no palpable bone. There is no erythema around the wound Integumentary (Hair, Skin) Wound #7 status is Open. Original cause of wound was Gradually Appeared. The date acquired was: 08/07/2020. The wound is located on the [place]. The wound measures 1.7cm length x 2cm width x 0.5cm depth; 2.67cm^2 area and 1.335cm^3 volume. There is Fat Layer (Subcutaneous Tissue) exposed. There is no tunneling or undermining noted. There is a medium amount of serous drainage noted. The wound margin is epibole.  There is large (67-100%) pale granulation within the wound bed. There is no necrotic tissue within the wound bed. Assessment Active Problems ICD-10 Type 2 diabetes mellitus with foot ulcer 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ity Type 2 diabetes mellitus with diabetic polyneuropathy Procedures Wound #7 Pre-procedure diagnosis of Wound #7 is a Pressure Ulcer located on the Left,Midline,Plantar Foot . There was a Excisional Skin/Subcutaneous Tissue Debridement with a total area of 6.25 sq cm performed by Ricard [name], MD. With the following instrument(s): Curette to remove Viable and Non-Viable tissue/material. Material removed includes Subcutaneous Tissue, Slough, Skin: Dermis, and Skin: Epidermis after achieving pain control using Lidocaine 4% Topical Solution. No specimens were taken. A time out was conducted at 15:25, prior to the start of the procedure. A Moderate amount of bleeding was controlled with Silver Nitrate. The procedure was tolerated well with a pain level of 0 throughout and a pain level of 0 following the procedure. Post Debridement Measurements: 2.5cm length x 2.5cm width x 1.7cm depth; 8.345cm^3 volume. Post debridement Stage noted as Category/Stage II. Character of Wound/Ulcer Post Debridement is improved. Post procedure Diagnosis Wound #7: Same as Pre-Procedure Plan Follow-up Appointments: Return Appointment in 1 week. Bathing/ Shower/ Hygiene: Clean wound with Normal Saline or wound cleanser. WOUND #7: - Foot Wound Laterality: Plantar, Left, Midline Cleanser: Soap and Water 3 x Per Week/30 Days Discharge Instructions: Gently cleanse wound with antibacterial soap, rinse and pat dry prior to dressing wounds Primary Dressing: Prisma 4.34 (in) 3 x Per Week/30 Days Discharge Instructions: Moisten with hydrogel Lengyel, Khandi A. ([id number]) Secondary Dressing: Bordered Gauze  Sterile-HBD 4x4 (in/in) 3 x Per Week/30  Days Discharge Instructions: Cover wound with Bordered Guaze Sterile as directed 1. Chronic wound on the left plantar midfoot. 2. There are many impediments to even considering healing this including she is not offloading this at all walking around in socks, I do not think she is specifically dressing the wound and the heavy cigarette use. Her diabetes is poorly controlled however she states that she is working on this with her physician I talked to her about all of these. In fact I offered her simple palliative dressing however she told me she wanted to move ahead with attempting to get some healing of this if possible. 3. The wound bed was completely lifeless I remove the raised edges some of the gritty fibrinous debris over the circumference of the wound. In the middle of the wound there was some other type of tissue however it was not granulation tissue. I wonder if there is chronic underlying infection here which can sometimes give nonviable surfaces. In any case I removed a fair amount of this this probes deeply but not to bone. 4. We use Prisma moistened with collagen to this wound. She has Medicaid so we can only change this twice and we will change it once. 5. She has I think what sounds like a cam boot and I have asked her to wear this to attempt to offload. I have asked her to bring this in next week 6. I also stopped talk to her about cigarette smoke and told her mother she would have to quit as well to give the patient any chance to quit. I did not sense there was a lot of motivation on either side 7. I did not culture this. I do not think she has an arterial issue. 8. If she turns out to be more compliant then her records indicate that I would not be adverse to putting her in a cast if we can exclude infection. She says that she had an x-ray of the foot about a month ago I think we should try to check into this 9. She had a recent culture of this wound that showed E. coli but this was  apparently done by an outside physician. He antibiotics were prescribed but according to Dr. Deborra Medina note she did not really take them because of her "kidney function". I did not culture this wound today but that may need to be done. 10. She may require an MRI as well I spent 55 minutes in review of this patient's past medical history, face-to-face evaluation and preparation of this record Electronic Signature(s) Signed: 03/19/2021 4:35:25 PM By: Linton Ham MD Entered By: Linton Ham on 03/19/2021 16:17:46 [name] ([id number]) -------------------------------------------------------------------------------- ROS/PFSH Details Patient Name: [name] Date of Service: 03/19/2021 2:30 PM Medical Record Number: [id number] Patient Account Number: [id number] Date of Birth/Sex: [birth year] (45 y.o. F) Treating RN: Cornell Barman Primary Care Provider: Raelene Bott Other Clinician: Referring Provider: Caroline More Treating Provider/Extender: Tito Dine in Treatment: 0 Information Obtained From Patient Genitourinary Complaints and Symptoms: Positive for: Kidney failure/ Dialysis - Stage IV Medical History: Positive for: End Stage Renal Disease Integumentary (Skin) Complaints and Symptoms: Positive for: Wounds Medical History: Positive for: History of pressure wounds Musculoskeletal Complaints and Symptoms: Positive for: Muscle Weakness Ear/Nose/Mouth/Throat Medical History: Positive for: Chronic sinus problems/congestion; Middle ear problems Hematologic/Lymphatic Medical History: Positive for: Anemia Respiratory Medical History: Positive for: Chronic Obstructive Pulmonary Disease (COPD) Cardiovascular Medical History: Positive for: Congestive  Heart Failure - EF 33% Endocrine Medical History: Positive for: Type II Diabetes Time with diabetes: 14 years Treated with: Insulin, Oral agents Blood sugar tested every day: No Blood sugar testing  results: Bedtime: 176 Immunological Neurologic Bellisario, Laiklynn A. ([id number]) Medical History: Positive for: Neuropathy - feel and legs Psychiatric Complaints and Symptoms: Review of System Notes: Depression HBO Extended History Items Ear/Nose/Mouth/Throat: Ear/Nose/Mouth/Throat: Chronic sinus problems/congestion Middle ear problems Immunizations Pneumococcal Vaccine: Received Pneumococcal Vaccination: No Implantable Devices No devices added Family and Social History Current every day smoker; Marital Status - Single; Alcohol Use: Never; Drug Use: Prior History; Caffeine Use: Daily Electronic Signature(s) Signed: 03/19/2021 3:58:10 PM By: Gretta Cool, BSN, RN, CWS, [name] RN, BSN Signed: 03/19/2021 4:35:25 PM By: Linton Ham MD Entered By: Gretta Cool, BSN, RN, CWS, [name] on 03/19/2021 14:52:28 [name] ([id number]) -------------------------------------------------------------------------------- [facility] Details Patient Name: [name] Date of Service: 03/19/2021 Medical Record Number: [id number] Patient Account Number: [id number] Date of Birth/Sex: 28-Mar-1976 (45 y.o. F) Treating RN: Carlene Coria Primary Care Provider: Raelene Bott Other Clinician: Referring Provider: Caroline More Treating Provider/Extender: Tito Dine in Treatment: 0 Diagnosis Coding ICD-10 Codes Code Description E11.621 Type 2 diabetes mellitus with foot ulcer 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ity E11.42 Type 2 diabetes mellitus with diabetic polyneuropathy Facility Procedures CPT4 Code: [id number] Description: [place] VISIT-LEV 3 EST PT Modifier: Quantity: 1 CPT4 Code: [id number] Description: [id number] - DEB SUBQ TISSUE 20 SQ CM/< Modifier: Quantity: 1 CPT4 Code: Description: ICD-10 Diagnosis Description E11.621 Type 2 diabetes mellitus with foot ulcer Modifier: Quantity: Physician Procedures CPT4 Code:  [id number] Description: [id number] - WC PHYS LEVEL 5 - EST PT Modifier: 25 Quantity: 1 CPT4 Code: Description: ICD-10 Diagnosis Description E11.621 Type 2 diabetes mellitus with foot ulcer 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 E11.42 Type 2 diabetes mellitus with diabetic polyneuropathy Modifier: severity Quantity: CPT4 Code: [id number] Description: [id number] - WC PHYS SUBQ TISS 20 SQ CM Modifier: Quantity: 1 CPT4 Code: Description: ICD-10 Diagnosis Description E11.621 Type 2 diabetes mellitus with foot ulcer Modifier: Quantity: Electronic Signature(s) Signed: 03/19/2021 4:35:25 PM By: Linton Ham MD Entered By: Linton Ham on 03/19/2021 16:18:49

## 2021-03-24 ENCOUNTER — Encounter: Payer: Medicaid Other | Attending: Internal Medicine | Admitting: Internal Medicine

## 2021-03-24 ENCOUNTER — Other Ambulatory Visit: Payer: Self-pay

## 2021-03-24 ENCOUNTER — Ambulatory Visit
Admission: RE | Admit: 2021-03-24 | Discharge: 2021-03-24 | Disposition: A | Discharge status: Home / Self Care | Payer: Medicaid Other | Attending: Internal Medicine | Admitting: Internal Medicine

## 2021-03-24 ENCOUNTER — Emergency Department
Admission: EM | Admit: 2021-03-24 | Discharge: 2021-03-24 | Disposition: A | Discharge status: Home / Self Care | Payer: Medicaid Other | Attending: Emergency Medicine | Admitting: Emergency Medicine

## 2021-03-24 ENCOUNTER — Other Ambulatory Visit: Payer: Self-pay | Admitting: Internal Medicine

## 2021-03-24 ENCOUNTER — Ambulatory Visit
Admission: RE | Admit: 2021-03-24 | Discharge: 2021-03-24 | Disposition: A | Discharge status: Home / Self Care | Payer: Medicaid Other | Source: Ambulatory Visit | Attending: Internal Medicine | Admitting: Internal Medicine

## 2021-03-24 DIAGNOSIS — R110 Nausea: Secondary | ICD-10-CM | POA: Diagnosis not present

## 2021-03-24 DIAGNOSIS — E1142 Type 2 diabetes mellitus with diabetic polyneuropathy: Secondary | ICD-10-CM | POA: Diagnosis not present

## 2021-03-24 DIAGNOSIS — E119 Type 2 diabetes mellitus without complications: Secondary | ICD-10-CM | POA: Diagnosis not present

## 2021-03-24 DIAGNOSIS — L97529 Non-pressure chronic ulcer of other part of left foot with unspecified severity: Secondary | ICD-10-CM | POA: Diagnosis not present

## 2021-03-24 DIAGNOSIS — Z5321 Procedure and treatment not carried out due to patient leaving prior to being seen by health care provider: Secondary | ICD-10-CM | POA: Insufficient documentation

## 2021-03-24 DIAGNOSIS — E11621 Type 2 diabetes mellitus with foot ulcer: Secondary | ICD-10-CM | POA: Diagnosis present

## 2021-03-24 DIAGNOSIS — S81802A Unspecified open wound, left lower leg, initial encounter: Secondary | ICD-10-CM

## 2021-03-24 LAB — CBC
HCT: 33.6 % — ABNORMAL LOW (ref 36.0–46.0)
Hemoglobin: 11.1 g/dL — ABNORMAL LOW (ref 12.0–15.0)
MCH: 27.8 pg (ref 26.0–34.0)
MCHC: 33 g/dL (ref 30.0–36.0)
MCV: 84 fL (ref 80.0–100.0)
Platelets: 382 10*3/uL (ref 150–400)
RBC: 4 MIL/uL (ref 3.87–5.11)
RDW: 18.3 % — ABNORMAL HIGH (ref 11.5–15.5)
WBC: 9.5 10*3/uL (ref 4.0–10.5)
nRBC: 0 % (ref 0.0–0.2)

## 2021-03-24 LAB — COMPREHENSIVE METABOLIC PANEL
ALT: 32 U/L (ref 0–44)
AST: 24 U/L (ref 15–41)
Albumin: 3.7 g/dL (ref 3.5–5.0)
Alkaline Phosphatase: 95 U/L (ref 38–126)
Anion gap: 12 (ref 5–15)
BUN: 38 mg/dL — ABNORMAL HIGH (ref 6–20)
CO2: 24 mmol/L (ref 22–32)
Calcium: 9.5 mg/dL (ref 8.9–10.3)
Chloride: 97 mmol/L — ABNORMAL LOW (ref 98–111)
Creatinine, Ser: 2.61 mg/dL — ABNORMAL HIGH (ref 0.44–1.00)
GFR, Estimated: 22 mL/min — ABNORMAL LOW (ref >60)
Glucose, Bld: 270 mg/dL — ABNORMAL HIGH (ref 70–99)
Potassium: 4.8 mmol/L (ref 3.5–5.1)
Sodium: 133 mmol/L — ABNORMAL LOW (ref 135–145)
Total Bilirubin: 0.4 mg/dL (ref 0.3–1.2)
Total Protein: 8.8 g/dL — ABNORMAL HIGH (ref 6.5–8.1)

## 2021-03-24 LAB — LIPASE, BLOOD: Lipase: 36 U/L (ref 11–51)

## 2021-03-24 LAB — CBG MONITORING, ED: Glucose-Capillary: 267 mg/dL — ABNORMAL HIGH (ref 70–99)

## 2021-03-24 NOTE — ED Triage Notes (Signed)
Pt comes with c/o nausea for few days. Pt states she doesn't know if it is her sugar. Pt state she takes meds for her diabetes regularly.  Pt denies any belly pain.

## 2021-03-25 NOTE — Progress Notes (Signed)
[name], [name] ([id number]) Visit Report for 03/24/2021 Debridement Details Patient Name: [name], [name] Date of Service: 03/24/2021 12:30 PM Medical Record Number: [id number] Patient Account Number: [id number] Date of Birth/Sex: [birth year] (45 y.o. F) Treating RN: Cornell Barman Primary Care Provider: Raelene Bott Other Clinician: Referring Provider: Raelene Bott Treating Provider/Extender: Tito Dine in Treatment: 0 Debridement Performed for Wound #7 Left,Midline,Plantar Foot Assessment: Performed By: Physician Ricard [name], MD Debridement Type: Debridement Level of Consciousness (Pre- Awake and Alert procedure): Pre-procedure Verification/Time Out Yes - 13:06 Taken: Start Time: 13:06 Total Area Debrided (L x W): 2 (cm) x 2 (cm) = 4 (cm) Tissue and other material Non-Viable, Subcutaneous debrided: Level: Skin/Subcutaneous Tissue Debridement Description: Excisional Instrument: Curette Bleeding: Moderate Hemostasis Achieved: Silver Nitrate Response to Treatment: Procedure was tolerated well Level of Consciousness (Post- Awake and Alert procedure): Post Debridement Measurements of Total Wound Length: (cm) 2 Stage: Category/Stage II Width: (cm) 2 Depth: (cm) 1 Volume: (cm) 3.142 Character of Wound/Ulcer Post Debridement: Stable Post Procedure Diagnosis Same as Pre-procedure Electronic Signature(s) Signed: 03/24/2021 5:08:05 PM By: Linton Ham MD Signed: 03/25/2021 2:40:56 PM By: Gretta Cool, BSN, RN, CWS, [name] RN, BSN Entered By: Linton Ham on 03/24/2021 [place], [place] ([id number]) -------------------------------------------------------------------------------- HPI Details Patient Name: Heather Dear A. Date of Service: 03/24/2021 12:30 PM Medical Record Number: [id number] Patient Account Number: [id number] Date of Birth/Sex: Feb 23, 1976 (45 y.o. F) Treating RN: Cornell Barman Primary Care Provider: Raelene Bott Other Clinician: Referring  Provider: Raelene Bott Treating Provider/Extender: Tito Dine in Treatment: 0 History of Present Illness HPI Description: 01/18/18-She is here for initial evaluation of the left great toe ulcer. She is a poor historian in regards to timeframe in detail. She states approximately 4 weeks ago she lacerated her toe on something in the house. She followed up with her primary care who placed her on Bactrim and ultimately a second dose of Bactrim prior to coming to wound clinic. She states she has been treating the toe with peroxide, Betadine and a Band-Aid. She did not check her blood sugar this morning but checked it yesterday morning it was 327; she is unaware of a recent A1c and there are no current records. She saw Dr. she would've orthopedics last week for an old injury to the left ankle, she states he did not see her toe, nor did she bring it to his attention. She smokes approximately 1 pack cigarettes a day. Her social situation is concerning, she arrives this morning with her mother who appears extremely intoxicated/under the influence; her mother was asked to leave the room and be monitored by the patient's grandmother. The patient's aunt then accompanied the patient and the room throughout the rest of the appointment. We had a lengthy discussion regarding the deleterious effects of uncontrolled hyperglycemia and smoking as it relates to wound healing and overall health. She was strongly encouraged to decrease her smoking and get her diabetes under better control. She states she is currently on a diet and has cut down her [facility] consumption. The left toe is erythematous, macerated and slightly edematous with malodor present. The edema in her left foot is below her baseline, there is no erythema streaking. We will treat her with Santyl, doxycycline; we have ordered and xray, culture and provided a Peg assist surgical shoe and cultured the wound. 01/25/18-She is here in follow-up  evaluation for a left great toe ulcer and presents with an abscess to her suprapubic area. She states her blood sugars  remain elevated, feeling "sick" and if levels are below 250, but she is trying. She has made no attempt to decrease her smoking stating that we "can't take away her food in her cigarettes". She has been compliant with offloading using the PEG assist you. She is using Santyl daily. the culture obtained last week grew staph aureus and Enterococcus faecalis; continues on the doxycycline and Augmentin was added on Monday. The suprapubic area has erythema, no femoral variation, purple discoloration, minimal induration, was accessed with a cotton tip applicator with sanguinopurulent drainage, this was cultured, I suspect the current antibiotic treatment will cover and we will not add anything to her current treatment plan. She was advised to go to urgent care or ER with any change in redness, induration or fever. 02/01/18-She is here in follow-up evaluation for left great toe ulcers and a new abdominal abscess from last week. She was able to use packing until earlier this week, where she "forgot it was there". She states she was feeling ill with GI symptoms last week and was not taking her antibiotic. She states her glucose levels have been predominantly less than 200, with occasional levels between 200-250. She thinks this was contributing to her GI symptoms as they have resolved without intervention. There continues to be significant laceration to left toe, otherwise it clinically looks stable/improved. There is now less superficial opening to the lateral aspect of the great toe that was residual blister. We will transition to [facility] to all wounds, she will continue her Augmentin. If there is no change or deterioration next week for reculture. 02/08/18-She is here in follow-up evaluation for left great toe ulcer and abdominal ulcer. There is an improvement in both wounds. She has  been wrapping her left toe with coban, not by our direction, which has created an area of discoloration to the medial aspect; she has been advised to NOT use coban secondary to her neuropathy. She states her glucose levels have been high over this last week ranging from 200-350, she continues to smoke. She admits to being less compliant with her offloading shoe. We will continue with same treatment plan and she will follow-up next week. 02/15/18-She is here in follow-up evaluation for left great toe ulcer and abdominal ulcer. The abdominal ulcer is epithelialized. The left great toe ulcer is improved and all injury from last week using the Coban wrap is resolved, the lateral ulcer is healed. She admits to noncompliance with wearing offloading shoe and admits to glucose levels being greater than 300 most of the week. She continues to smoke and expresses no desire to quit. There is one area medially that probes deeper than it has historically, erythema to the toe and dorsal foot has consistently waxed and waned. There is no overt signs of cellulitis or infection but we will culture the wound for any occult infection given the new area of depth and erythema. We will hold off on sensitivities for initiation of antibiotic therapy. 02/22/18-She is here in follow up evaluation for left great toe ulcer. There is overall significant improvement in both wound appearance, erythema and edema with changes made last week. She was not initiated on antibiotic therapy. Culture obtained last week showed oxacillin sensitive staph aureus, sensitive to clindamycin. Clindamycin has been called into the pharmacy but she has been instructed to hold off on initiation secondary to overall clinical improvement and her history of antibiotic intolerance. She has been instructed to contact the clinic with any noted changes/deterioration and the wound,  erythema, edema and/or pain. She will follow-up next week. She continues to smoke  and her glucose levels remain elevated >250; she admits to compliance with offloading shoe 03/01/18 on evaluation today patient appears to be doing fairly well in regard to her left first toe ulcer. She has been tolerating the dressing changes with the [facility] Dressing without complication and overall this has definitely showed signs of improvement according to records as well is what the patient tells me today. I'm very pleased in that regard. She is having no pain today 03/08/18 She is here for follow up evaluation of a left great toe ulcer. She remains non-compliant with glucose control and smoking cessation; glucose levels consistently >200. She states that she got new shoe inserts/peg assist. She admits to compliance with offloading. Since my last evaluation there is significant improvement. We will switch to prisma at this time and she will follow up next week. She is noted to be tachycardic at this appointment, heart rate 120s; she has a history of heart rate 70-130 according to our records. She admits to extreme agitation r/t personal issues; she was advised to monitor her heartrate and contact her physician if it does not return to a more normal range (<100). She takes cardizem twice daily. 03/15/18-She is here in follow-up evaluation for left great toe ulcer. She remains noncompliant with glucose control and smoking cessation. She admits to compliance with wearing offloading shoe. The ulcer is improved/stable and we will continue with the same treatment plan and she will follow-up next week 03/22/18-She is here for evaluation for left great toe ulcer. There continues to be significant improvement despite recurrent hyperglycemia (over 500 yesterday) and she continues to smoke. She has been compliant with offloading and we will continue with same treatment plan and she will follow-up next week. 03/29/18-She is here for evaluation for left great toe ulcer. Despite continuing to smoke and  uncontrolled diabetes she continues to improve. She is compliant with offloading shoe. We will continue with the same treatment plan and she will follow-up next week 04/05/18- She is here in follow up evaluation for a left great toe ulcer; she presents with small pustule to left fifth toe (resembles ant bite). She admits to compliance with wearing offloading shoe; continues to smoke or have uncontrolled blood glucose control. There is more callus than usual with evidence of bleeding; she denies known trauma. 04/12/18-She is here for evaluation of left great toe ulcer. Despite noncompliance with glycemic control and smoking she continues to make Perl, Talyia A. ([id number]) improvement. She continues to wear offloading shoe. The pustule, that was identified last week, to the left fifth toe is resolved. She will follow-up in 2 weeks 05/03/18-she is seen in follow-up evaluation for a left great toe ulcer. She is compliant with offloading, otherwise noncompliant with glycemic control and smoking. She has plateaued and there is minimal improvement noted. We will transition to [facility], replaced the insert to her surgical shoe and she will follow-up in one week 05/10/18- She is here in follow up evaluation for a left great toe ulcer. It appears stable despite measurement change. We will continue with same treatment plan and follow up next week. 05/24/18-She is seen in follow-up evaluation for a left great toe ulcer. She remains compliant with offloading, has made significant improvement in her diet, decreasing the amount of sugar/soda. She said her recent A1c was 10.9 which is lower than. She did see a diabetic nutritionist/educator yesterday. She continues to smoke. We  will continue with the same treatment plan and she'll follow-up next week. 05/31/18- She is seen in follow-up evaluation for left great toe ulcer. She continues to remain compliant with offloading, continues to make improvement in her  diet, increasing her water and decreasing the amount of sugar/soda. She does continue to smoke with no desire to quit. We will apply Prisma to the depth and Hydrofera Blue over. We have not received insurance authorization for oasis. She will follow up next week. 06/07/18-She is seen in follow-up evaluation for left great toe ulcer. It has stalled according to today's measurements although base appears stable. She says she saw a diabetic educator yesterday; her average blood sugars are less than 300 which is an improvement for her. She continues to smoke and states "that's my next step" She continues with water over soda. We will order for xray, culture and reinstate ace wrap compression prior to placing apligraf for next week. She is voicing no complaints or concerns. Her dressing will change to iodoflex over the next week in preparation for apligraf. 06/14/18-She is seen in follow-up evaluation for left great toe ulcer. Plain film x-ray performed last week was negative for osteomyelitis. Wound culture obtained last week grew strep B and OSSA; she is initiated on keflex and cefdinir today; there is erythema to the toe which could be from ace wrap compression, she has a history of wrapping too tight and has has been encouraged to maintain ace wraps that we place today. We will hold off on application of apligraf today, will apply next week after antibiotic therapy has been initiated. She admits today that she has resumed taking a shower with her foot/toe submerged in water, she has been reminded to keep foot/toe out of the bath water. She will be seen in follow up next week 06/21/18-she is seen in follow-up evaluation for left great toe ulcer. She is tolerating antibiotic therapy with no GI disturbance. The wound is stable. Apligraf was applied today. She has been decreasing her smoking, only had 4 cigarettes yesterday and 1 today. She continues being more compliant in diabetic diet. She will follow-up  next week for evaluation of site, if stable will remove at 2 weeks. 06/28/18- She is here in follow up evalution. Apligraf was placed last week, she states the dressing fell off on Tuesday and she was dressing with hydrofera blue. She is healed and will be discharged from the clinic today. She has been instructed to continue with smoking cessation, continue monitoring glucose levels, offloading for an additional 4 weeks and continue with hydrofera blue for additional two weeks for any possible microscopic opening. Readmission: 08/07/18 on evaluation today patient presents for reevaluation concerning the ulcer of her right great toe. She was previously discharged on 06/28/18 healed. Nonetheless she states that this began to show signs of drainage she subsequently went to her primary care provider. Subsequently an x-ray was performed on 08/01/18 which was negative. The patient was also placed on antibiotics at that time. Fortunately they should have been effective for the infection. Nonetheless she's been experiencing some improvement but still has a lot of drainage coming from the wound itself. 08/14/18 on evaluation today patient's wound actually does show signs of improvement in regard to the erythema at this point. She has completed the antibiotics. With that being said we did discuss the possibility of placing her in a total contact cast as of today although I think that I may want to give this just a little bit more  time to ensure nothing recurrence as far as her infection is concerned. I do not want to put in the cast and risk infection at that time if things are not completely resolved. With that being said she is gonna require some debridement today. 08/21/18 on evaluation today patient actually appears to be doing okay in regard to her toe ulcer. She's been tolerating the dressing changes without complication. With that being said it does appear that she is ready and in fact I think it's  appropriate for [place] to go ahead and initiate the total contact cast today. Nonetheless she will require some sharp debridement to prepare the wound for application. Overall I feel like things have been progressing well but we do need to do something to get this to close more readily. 08/24/18 patient seen today for reevaluation after having had the total contact cast applied on Tuesday. She seems to have done very well the wound appears to be doing great and overall I'm pleased with the progress that she's made. There were no abnormal areas of rubbing from the cast on her lower extremity. 08/30/18 on evaluation today patient actually appears to be completely healed in regard to her plantar toe ulcer. She tells me at this point she's been having a lot of issues with the cast. She almost fell a couple of times the state shall the step of her dog a couple times as well. This is been a very frustrating process for her other nonetheless she has completely healed the wound which is excellent news. Overall there does not appear to be the evidence of infection at this time which is great news. 09/11/18 evaluation today patient presents for follow-up concerning her great toe ulcer on the left which has unfortunately reopened since I last saw her which was only a couple of weeks ago. Unfortunately she was not able to get in to get the shoe and potentially the AFO that's gonna be necessary due to her left foot drop. She continues with offloading shoe but this is not enough to prevent her from reopening it appears. When we last had her in the total contact cast she did well from a healing standpoint but unfortunately the wound reopened as soon as she came out of the cast within just a couple of weeks. Right now the biggest concern is that I do believe the foot drop is leading to the issue and this is gonna continue to be an issue unfortunately until we get things under control as far as the walking anomaly is  concerned with the foot drop. This is also part of the reason why she falls on a regular basis. I just do not believe that is gonna be safe for [place] to reinitiate the total contact cast as last time we had this on she fell 3 times one week which is definitely not normal for her. 09/18/18 upon evaluation today the patient actually appears to be doing about the same in regard to her toe ulcer. She did not contact [employer] as I asked her to even though I had given her the prescription. In fact she actually states that she has no idea where the prescription is. She did apparently call [employer] and they told her that all she needed to do was bring the prescription in order to be able to be seen and work on getting the AFO for her left foot. With all that being said she still does not have an appointment and I'm not sure were  things stand that regard. I will give her a new prescription today in order to contact them to get this set up. 09/25/18 on evaluation today patient actually appears to be doing about the same in regard to her toes ulcer. She does have a small areas which seems to have a lot of callous buildup around the edge of the wound which is going to need sharp debridement today. She still is waiting to be scheduled for evaluation with [employer] for possibility of an AFO. She states there supposed to call her tomorrow to get this set up. Unfortunately it does appear that her foot specifically the toe area is showing signs of erythema. There does not appear to be any systemic infection which is in these good news. [name], [name] ([id number]) 10/02/18 on evaluation today patient actually appears to be doing about the same in regard to her toe ulcer. This really has not done too well although it's not significantly larger it's also not significantly smaller. She has been tolerating the dressing changes without complication. She actually has her appointment with [employer] and [facility] tomorrow to hopefully  be measured for obtaining and AFO splint. I think this would be helpful preventing this from reoccurring. We had contemplated starting the cast this week although to be honest I am reluctant to do that as she's been having nausea, vomiting, and seizure activity over the past three days. She has a history of seizures and have been told is nothing that can be done for these. With that being said I do believe that along with the seizures have the nausea vomiting which upon further questioning doesn't seem to be the normal for her and makes me concerned for the possibility of infection or something else going on. I discussed this with the patient and her mother during the office visit today. I do not feel the wound is effective but maybe something else. The responses this was "this just happens to her at times and we don't know why". They did not seem to be interested in going to the hospital to have this checked out further. 10/09/18 on evaluation today patient presents for follow-up concerning her ongoing toe ulcer. She has been tolerating the dressing changes without complication. Fortunately there does not appear to be any evidence of infection which is great news however I do think that the patient would benefit from going ahead for with the total contact cast. She's actually in a wheelchair today she tells me that she will use her walker if we initiate the cast. I was very specific about the fact that if we were gonna do the cast I wanted to make sure that she was using the walker in order to prevent any falls. She tells me she does not have stairs that she has to traverse on a regular basis at her home. She has not had any seizures since last week again that something that happens to her often she tells me she did talk to [employer] and they said that it may take up to three weeks to get the brace approved for her. Hopefully that will not take that long but nonetheless in the meantime I do think the  cast could be of benefit. 10/12/18 on evaluation today patient appears to be doing rather well in regard to her toe ulcer. It's just been a few days and already this is significantly improved both as far as overall appearance and size. Fortunately there's no sign of infection. She is here for  her first obligatory cast change. 10/19/18 Seen today for follow up and management of left great toe ulcer. Wound continues to show improvement. Noted small open area with seroussang drainage with palpation. Denies any increased pain or recent fevers during visit. She will continue calcium alginate with offloading shoe. Denies any questions or concerns during visit. 10/26/18 on evaluation today patient appears to be doing about the same as when I last saw her in regard to her wound bed. Fortunately there does not appear to be any signs of infection. Unfortunately she continues to have a breakdown in regard to the toe region any time that she is not in the cast. It takes almost no time at all for this to happen. Nonetheless she still has not heard anything from the brace being made by [employer] as to when exactly this will be available to her. Fortunately there is no signs of infection at this time. 10/30/18 on evaluation today patient presents for application of the total contact cast as we just received him this morning. Fortunately we are gonna be able to apply this to her today which is great news. She continues to have no significant pain which is good news. Overall I do feel like things have been improving while she was the cast is when she doesn't have a cast that things get worse. She still has not really heard anything from [place] regarding her brace. 11/02/18 upon evaluation today patient's wound already appears to be doing significantly better which is good news. Fortunately there does not appear to be any signs of infection also good news. Overall I do think the total contact cast as before is helping to  heal this area unfortunately it's just not gonna likely keep the area closed and healed without her getting her brace at least. Again the foot drop is a significant issue for her. 11/09/18 on evaluation today patient appears to be doing excellent in regard to her toe ulcer which in fact is completely healed. Fortunately we finally got the situation squared away with the paperwork which was needed to proceed with getting her brace approved by Medicaid. I have filled that out unfortunately that information has been sent to the orthopedic office that I worked at 2 1/2 years ago and not tired Current wound care measures. Fortunately she seems to be doing very well at this time. 11/23/18 on evaluation today patient appears to be doing More Poorly Compared to Last Time I Saw Her. At [facility] She Had Completely Healed. Currently she is continuing to have issues with reopening. She states that she just found out that the brace was approved through Medicaid now she just has to go get measured in order to have this fitted for her and then made. Subsequently she does not have an appointment for this yet that is going to complicate things we obviously cannot put her back in the cast if we do not have everything measured because they're not gonna be able to measure her foot while she is in the cast. 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 Obviously this is unfortunate and does have me somewhat worried as well. 11/30/18 on evaluation today patient's toe ulcer actually appears to be doing fairly well. The good news is she will be getting her brace in the shoes next week on Wednesday. Hopefully we will be able to get this to heal  without having to go back in the cast however she may need the cast in order to get the wound completely heal and then go from there. Fortunately there's no signs of infection at this  time. 12/07/18 on evaluation today patient fortunately did receive her brace and she states she could tell this definitely makes her walk better. With that being said she's been having issues with her toe where she noticed yesterday there was a lot of tissue that was loosing off this appears to be much larger than what it was previous. She also states that her leg has been read putting much across the top of her foot just about the ankle although this seems to be receiving somewhat. The total area is still red and appears to be someone infected as best I can tell. She is previously taken Bactrim and that may be a good option for her today as well. We are gonna see what I wound culture shows as well and I think that this is definitely appropriate. With that being said outside of the culture I still need to initiate something in the interim and that's what I'm gonna go ahead and select Bactrim is a good option for her. 12/14/18 on evaluation today patient appears to be doing better in regard to her left great toe ulcer as compared to last week's evaluation. There's still some erythema although this is significantly improved which is excellent news. Overall I do believe that she is making good progress is still gonna take some time before she is where I would like her to be from the standpoint of being able to place her back into the total contact cast. Hopefully we will be where we need to be by next week. 12/21/18 on evaluation today patient actually appears to be doing poorly in regard to her toe ulcer. She's been tolerating the dressing changes without complication. Fortunately there's no signs of systemic infection although she does have a lot of drainage from the toe ulcer and this does seem to be causing some issues at this point. She does have erythema on the distal portion of her toe that appears to be likely cellulitis. 12/28/18 on evaluation today patient actually appears to be doing a little  better in my pinion in regard to her toe ulcer. With that being said she still does have some evidence of infection at this time and for her culture she had both E. coli as well as enterococcus as organisms noted on evaluation. For that reason I think that though the Keflex likely has treated the E. coli rather well this has really done nothing for the enterococcus. We are going to have to initiate treatment for this specifically. [name], [name] ([id number]) 01/04/19 on evaluation today patient's toe actually appears to be doing better from the standpoint of infection. She currently would like to see about putting the cash back on I think that this is appropriate as long as she takes care of it and keeps it from getting wet. She is gonna have some drainage we can definitely pass this up with Drawtex and alginate to try to prevent as much drainage as possible from causing the problems. With that being said I do want to at least try her with the cast between now and Tuesday. If there any issues we can't continue to use it then I will discontinue the use of the cast at that point. 01/08/19 on evaluation today patient actually appears to be doing very well as  far as her foot ulcer specifically the great toe on the left is concerned. She did have an area of rubbing on the medial aspect of her left ankle which again is from the cast. Fortunately there's no signs of infection at this point in this appears to be a very slight skin breakdown. The patient tells me she felt it rubbing but didn't think it was that bad. Fortunately there is no signs of active infection at this time which is good news. No fevers, chills, nausea, or vomiting noted at this time. 01/15/19 on evaluation today patient actually appears to be doing well in regard to her toe ulcer. Again as previous she seems to do well and she has the cast on which indicates to me that during the time she doesn't have a cast on she's putting way too much  pressure on this region. Obviously I think that's gonna be an issue as with the current national emergency concerning the Covid-19 Virus it has been recommended that we discontinue the use of total contact casting by the chief medical officer of our company, Dr. Simona Huh. The reasoning is that if a patient becomes sick and cannot come into have the cast removed they could not just leave this on for an additional two weeks. Obviously the hospitals also do not want to receive patient's who are sick into the emergency department to potentially contaminate the region and spread the Covid-19 Virus among other sick individuals within the hospital system. Therefore at this point we are suspending the use of total contact cast until the current emergency subsides. This was all discussed with the patient today as well. 01/22/19 on evaluation today patient's wound on her left great toe appears to be doing slightly worse than previously noted last week. She tells me that she has been on this quite a bit in fact she tells me she's been awake for 38 straight hours. This is due to the fact that she's having to care for grandparents because nobody else will. She has been taking care of them for five the last seven days since I've seen her they both have dementia his is from a stroke and her grandmother's was progressive. Nonetheless she states even her mom who knows her condition and situation has only help two of those days to take care of them she's been taking care of the rest. Fortunately there does not appear to be any signs of active infection in regard to her toe at this point although obviously it doesn't look as good as it did previous. I think this is directly related to her not taking off the pressure and friction by way of taking things easy. Though I completely understand what's going on. 01/29/19 on evaluation today patient's tools are actually appears to be showing some signs of improvement today compared  to last week's evaluation as far as not necessarily the overall size of the wound but the fact that she has some new skin growth in between the two ends of the wound opening. Overall I feel like she has done well she states that she had a family member give her what sounds to be a CAM walker boot which has been helpful as well. 02/05/19 on evaluation today patient's wound bed actually appears to be doing significantly better in regard to her overall appearance of the size of the wound. With that being said she is still having an issue with offloading efficiently enough to get this to close. Apparently there is some  signs of infection at this point as well unfortunately. Previously she's done well of Augmentin I really do not see anything that needs to be culture currently but there theme and cellulitis of the foot that I'm seeing I'm gonna go ahead and place her on an antibiotic today to try to help clear this up. 02/12/2019 on evaluation today patient actually appears to be doing poorly in regard to her overall wound status. She tells me she has been using her offloading shoe but actually comes in today wearing her tennis shoe with the AFO brace. Again as I previously discussed with her this is really not sufficient to allow the area to heal appropriately. Nonetheless she continues to be somewhat noncompliant and I do wonder based on what she has told my nurse in the past as to whether or not a good portion of this noncompliance may be recreational drug and alcohol related. She has had a history of heroin overdose and this was fairly recently in the past couple of months that have been seeing her. Nonetheless overall I feel like her wound looks significantly worse today compared to what it was previous. She still has significant erythema despite the Augmentin I am not sure that this is an appropriate medication for her infection I am also concerned that the infection may have gone down into her  bone. 02/19/19 on evaluation today patient actually appears to be doing about the same in regard to her toe ulcer. Unfortunately she continues to show signs of bone exposure and infection at this point. There does not appear to be any evidence of worsening of the infection but I'm also not really sure that it's getting significantly better. She is on the Augmentin which should be sufficient for the Staphylococcus aureus infection that she has at this point. With that being said she may need IV antibiotics to more appropriately treat this. We did have a discussion today about hyperbaric option therapy. 02/28/19 on evaluation today patient actually appears to be doing much worse in regard to the wound on her left great toe as compared to even my previous evaluation last week. Unfortunately this seems to be training in a pretty poor direction. Her toe was actually now starting to angle laterally and I can actually see the entire joint area of the proximal portion of the digit where is the distal portion of the digit again is no longer even in contact with the joint line. Unfortunately there's a lot more necrotic tissue around the edge and the toe appears to be showing signs of becoming gangrenous in my pinion. I'm very concerned about were things stand at this point. She did see infectious disease and they are planning to send in a prescription for Sivextro for her and apparently this has been approved. With that being said I don't think she should avoid taking this but at the same time I'm not sure that it's gonna be sufficient to save her toe at this point. 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 03/05/19 on evaluation today patient actually appears to be doing quite well in regard to her toe all  things considering. She still has bone exposed but there appears to be much less your thing on overall the appearance of the wound and the  toe itself is dramatically improved. She still does have some issues currently obviously with infection she did see vascular as well and there concerned that her blood flow to the toad. For that reason they are setting up for an angiogram next week. 03/14/19 on evaluation today patient appears to be doing very poor in regard to her toe and specifically in regard to the ulceration and the fact that she's starting to notice the toe was leaning even more towards the lateral aspect and the complete joint is visible on the proximal aspect of the joint. Nonetheless she's also noted a significant odor and the tip of the toe is turning more dark and necrotic appearing. Overall I think she is getting worse not better as far as this is concerned. For that reason I am recommending at this point that she likely needs to be seen for likely amputation. READMISSION 03/19/2021 This is a patient that we cared for in this clinic for a prolonged period of time in 2019 and 2020 with a left foot and left first toe wound. 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 ([id number]) 04/09/20 by Dr. Luana Shu. In December 2021 she had an ulcer on her right great toe as well as the fourth and fifth toes. 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 She also had an angiogram at that time and underwent angioplasty of the right anterior tibial artery. In any case she claims that the wound on the right foot is closed I did not look at this today which was probably an oversight although I think that should be done next week. After her surgery she developed a dehiscence but I do not see any follow-up of this. According to Dr. Deborra Medina last review that  she was out of the area being cared for by another physician but recently came back to his attention. 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 A culture of this area showed E. coli apparently before she came back to see Dr. Luana Shu she was supposed to be receiving antibiotics but she did not really take them. Nor is she offloading this area at all. Finally her last hemoglobin A1c listed in epic was in March 2022 at 14.1 she says things are a lot better since then although I am not sure. 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 She had this wound at the time. During this admission she had E. coli sepsis a TEE was negative. 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 She is still a heavy cigarette smoker. She has a history of bipolar disorder seizure disorder ABI in our clinic was 1.05 6/1; left midfoot in the setting of a TMA done previously. Round circular wound with a "knuckle" of protruding tissue. The problem is that the knuckle was not attached to any of the surrounding granulation and this probed proximally widely I removed a large portion of this tissue. This wound goes with considerable undermining laterally. I do not feel any bone there was no purulence but this is a deep wound. Electronic Signature(s) Signed: 03/24/2021 5:08:05 PM  By: Linton Ham MD Entered By: Linton Ham on 03/24/2021 13:15:44 [name] ([id number]) -------------------------------------------------------------------------------- Physical Exam Details Patient Name: Heather Dear A. Date of Service: 03/24/2021 12:30 PM Medical Record Number:  [id number] Patient Account Number: [id number] Date of Birth/Sex: 09-12-1976 (45 y.o. F) Treating RN: Cornell Barman Primary Care Provider: Raelene Bott Other Clinician: Referring Provider: Raelene Bott Treating Provider/Extender: Tito Dine in Treatment: 0 Constitutional Sitting or standing Blood Pressure is within target range for patient.. Pulse regular and within target range for patient.Marland Kitchen Respirations regular, non- labored and within target range.. Temperature is normal and within the target range for the patient.Marland Kitchen appears in no distress. Notes Wound exam; left plantar foot. The wound looked a little better than last week not quite as lifeless and pale nevertheless the center tissue nonadherent to the surrounding granulation. Almost a protruding area of nonattached tissue. I removed this. This is all subcutaneous tissue. Once this was removed there was bleeding which was controlled with pressure and silver nitrate this does not go to bone or the muscle layer of the foot but it does undermine considerably. I did not see anything worth culturing at this point Electronic Signature(s) Signed: 03/24/2021 5:08:05 PM By: Linton Ham MD Entered By: Linton Ham on 03/24/2021 13:16:51 [name] ([id number]) -------------------------------------------------------------------------------- Physician Orders Details Patient Name: Heather Dear A. Date of Service: 03/24/2021 12:30 PM Medical Record Number: [id number] Patient Account Number: [id number] Date of Birth/Sex: 08/30/1976 (45 y.o. F) Treating RN: Dolan Amen Primary Care Provider: Raelene Bott Other Clinician: Referring Provider: Raelene Bott Treating Provider/Extender: Tito Dine in Treatment: 0 Verbal / Phone Orders: No Diagnosis Coding Follow-up Appointments o Return Appointment in 1 week. Bathing/ Shower/ Hygiene o Clean wound with Normal Saline or wound cleanser. Wound  Treatment Wound #7 - Foot Wound Laterality: Plantar, Left, Midline Cleanser: Soap and Water 3 x Per Week/30 Days Discharge Instructions: Gently cleanse wound with antibacterial soap, rinse and pat dry prior to dressing wounds Primary Dressing: Prisma 4.34 (in) 3 x Per Week/30 Days Discharge Instructions: Moisten with hydrogel-tuck in undermining area Secondary Dressing: Bordered Gauze Sterile-HBD 4x4 (in/in) 3 x Per Week/30 Days Discharge Instructions: Cover wound with Bordered Guaze Sterile as directed Radiology o X-ray, foot - Left foot Electronic Signature(s) Signed: 03/24/2021 4:52:16 PM By: Georges Mouse, Minus Breeding RN Signed: 03/24/2021 5:08:05 PM By: Linton Ham MD Entered By: Georges Mouse, Minus Breeding on 03/24/2021 13:11:27 [name] ([id number]) -------------------------------------------------------------------------------- Problem List Details Patient Name: Heather Dear A. Date of Service: 03/24/2021 12:30 PM Medical Record Number: [id number] Patient Account Number: [id number] Date of Birth/Sex: [birth year] (45 y.o. F) Treating RN: Cornell Barman Primary Care Provider: Raelene Bott Other Clinician: Referring Provider: Raelene Bott Treating Provider/Extender: Tito Dine in Treatment: 0 Active Problems ICD-10 Encounter Code Description Active Date MDM Diagnosis E11.621 Type 2 diabetes mellitus with foot ulcer 03/19/2021 No Yes 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 03/19/2021 No Yes severity E11.42 Type 2 diabetes mellitus with diabetic polyneuropathy 03/19/2021 No Yes Inactive Problems Resolved Problems Electronic Signature(s) Signed: 03/24/2021 5:08:05 PM By: Linton Ham MD Entered By: Linton Ham on 03/24/2021 13:13:50 Granberg, Verdia A. ([id number]) -------------------------------------------------------------------------------- Progress Note Details Patient Name: Heather Dear A. Date of Service:  03/24/2021 12:30 PM Medical Record Number: [id number] Patient Account Number: [id number] Date of Birth/Sex: June 05, 1976 (45 y.o. F) Treating RN: Cornell Barman Primary Care Provider: Raelene Bott Other Clinician: Referring Provider: Raelene Bott Treating Provider/Extender: Tito Dine in Treatment:  0 Subjective History of Present Illness (HPI) 01/18/18-She is here for initial evaluation of the left great toe ulcer. She is a poor historian in regards to timeframe in detail. She states approximately 4 weeks ago she lacerated her toe on something in the house. She followed up with her primary care who placed her on Bactrim and ultimately a second dose of Bactrim prior to coming to wound clinic. She states she has been treating the toe with peroxide, Betadine and a Band-Aid. She did not check her blood sugar this morning but checked it yesterday morning it was 327; she is unaware of a recent A1c and there are no current records. She saw Dr. she would've orthopedics last week for an old injury to the left ankle, she states he did not see her toe, nor did she bring it to his attention. She smokes approximately 1 pack cigarettes a day. Her social situation is concerning, she arrives this morning with her mother who appears extremely intoxicated/under the influence; her mother was asked to leave the room and be monitored by the patient's grandmother. The patient's aunt then accompanied the patient and the room throughout the rest of the appointment. We had a lengthy discussion regarding the deleterious effects of uncontrolled hyperglycemia and smoking as it relates to wound healing and overall health. She was strongly encouraged to decrease her smoking and get her diabetes under better control. She states she is currently on a diet and has cut down her [facility] consumption. The left toe is erythematous, macerated and slightly edematous with malodor present. The edema in her left foot is below  her baseline, there is no erythema streaking. We will treat her with Santyl, doxycycline; we have ordered and xray, culture and provided a Peg assist surgical shoe and cultured the wound. 01/25/18-She is here in follow-up evaluation for a left great toe ulcer and presents with an abscess to her suprapubic area. She states her blood sugars remain elevated, feeling "sick" and if levels are below 250, but she is trying. She has made no attempt to decrease her smoking stating that we "can't take away her food in her cigarettes". She has been compliant with offloading using the PEG assist you. She is using Santyl daily. the culture obtained last week grew staph aureus and Enterococcus faecalis; continues on the doxycycline and Augmentin was added on Monday. The suprapubic area has erythema, no femoral variation, purple discoloration, minimal induration, was accessed with a cotton tip applicator with sanguinopurulent drainage, this was cultured, I suspect the current antibiotic treatment will cover and we will not add anything to her current treatment plan. She was advised to go to urgent care or ER with any change in redness, induration or fever. 02/01/18-She is here in follow-up evaluation for left great toe ulcers and a new abdominal abscess from last week. She was able to use packing until earlier this week, where she "forgot it was there". She states she was feeling ill with GI symptoms last week and was not taking her antibiotic. She states her glucose levels have been predominantly less than 200, with occasional levels between 200-250. She thinks this was contributing to her GI symptoms as they have resolved without intervention. There continues to be significant laceration to left toe, otherwise it clinically looks stable/improved. There is now less superficial opening to the lateral aspect of the great toe that was residual blister. We will transition to [facility] to all wounds, she will continue  her Augmentin. If there  is no change or deterioration next week for reculture. 02/08/18-She is here in follow-up evaluation for left great toe ulcer and abdominal ulcer. There is an improvement in both wounds. She has been wrapping her left toe with coban, not by our direction, which has created an area of discoloration to the medial aspect; she has been advised to NOT use coban secondary to her neuropathy. She states her glucose levels have been high over this last week ranging from 200-350, she continues to smoke. She admits to being less compliant with her offloading shoe. We will continue with same treatment plan and she will follow-up next week. 02/15/18-She is here in follow-up evaluation for left great toe ulcer and abdominal ulcer. The abdominal ulcer is epithelialized. The left great toe ulcer is improved and all injury from last week using the Coban wrap is resolved, the lateral ulcer is healed. She admits to noncompliance with wearing offloading shoe and admits to glucose levels being greater than 300 most of the week. She continues to smoke and expresses no desire to quit. There is one area medially that probes deeper than it has historically, erythema to the toe and dorsal foot has consistently waxed and waned. There is no overt signs of cellulitis or infection but we will culture the wound for any occult infection given the new area of depth and erythema. We will hold off on sensitivities for initiation of antibiotic therapy. 02/22/18-She is here in follow up evaluation for left great toe ulcer. There is overall significant improvement in both wound appearance, erythema and edema with changes made last week. She was not initiated on antibiotic therapy. Culture obtained last week showed oxacillin sensitive staph aureus, sensitive to clindamycin. Clindamycin has been called into the pharmacy but she has been instructed to hold off on initiation secondary to overall clinical improvement and her  history of antibiotic intolerance. She has been instructed to contact the clinic with any noted changes/deterioration and the wound, erythema, edema and/or pain. She will follow-up next week. She continues to smoke and her glucose levels remain elevated >250; she admits to compliance with offloading shoe 03/01/18 on evaluation today patient appears to be doing fairly well in regard to her left first toe ulcer. She has been tolerating the dressing changes with the [facility] Dressing without complication and overall this has definitely showed signs of improvement according to records as well is what the patient tells me today. I'm very pleased in that regard. She is having no pain today 03/08/18 She is here for follow up evaluation of a left great toe ulcer. She remains non-compliant with glucose control and smoking cessation; glucose levels consistently >200. She states that she got new shoe inserts/peg assist. She admits to compliance with offloading. Since my last evaluation there is significant improvement. We will switch to prisma at this time and she will follow up next week. She is noted to be tachycardic at this appointment, heart rate 120s; she has a history of heart rate 70-130 according to our records. She admits to extreme agitation r/t personal issues; she was advised to monitor her heartrate and contact her physician if it does not return to a more normal range (<100). She takes cardizem twice daily. 03/15/18-She is here in follow-up evaluation for left great toe ulcer. She remains noncompliant with glucose control and smoking cessation. She admits to compliance with wearing offloading shoe. The ulcer is improved/stable and we will continue with the same treatment plan and she will follow-up next week  03/22/18-She is here for evaluation for left great toe ulcer. There continues to be significant improvement despite recurrent hyperglycemia (over 500 yesterday) and she continues to smoke.  She has been compliant with offloading and we will continue with same treatment plan and she will follow-up next week. 03/29/18-She is here for evaluation for left great toe ulcer. Despite continuing to smoke and uncontrolled diabetes she continues to improve. She is compliant with offloading shoe. We will continue with the same treatment plan and she will follow-up next week 04/05/18- She is here in follow up evaluation for a left great toe ulcer; she presents with small pustule to left fifth toe (resembles ant bite). She admits to compliance with wearing offloading shoe; continues to smoke or have uncontrolled blood glucose control. There is more callus than usual with evidence of bleeding; she denies known trauma. 04/12/18-She is here for evaluation of left great toe ulcer. Despite noncompliance with glycemic control and smoking she continues to make [name], [name] ([id number]) improvement. She continues to wear offloading shoe. The pustule, that was identified last week, to the left fifth toe is resolved. She will follow-up in 2 weeks 05/03/18-she is seen in follow-up evaluation for a left great toe ulcer. She is compliant with offloading, otherwise noncompliant with glycemic control and smoking. She has plateaued and there is minimal improvement noted. We will transition to [facility], replaced the insert to her surgical shoe and she will follow-up in one week 05/10/18- She is here in follow up evaluation for a left great toe ulcer. It appears stable despite measurement change. We will continue with same treatment plan and follow up next week. 05/24/18-She is seen in follow-up evaluation for a left great toe ulcer. She remains compliant with offloading, has made significant improvement in her diet, decreasing the amount of sugar/soda. She said her recent A1c was 10.9 which is lower than. She did see a diabetic nutritionist/educator yesterday. She continues to smoke. We will continue with the  same treatment plan and she'll follow-up next week. 05/31/18- She is seen in follow-up evaluation for left great toe ulcer. She continues to remain compliant with offloading, continues to make improvement in her diet, increasing her water and decreasing the amount of sugar/soda. She does continue to smoke with no desire to quit. We will apply Prisma to the depth and Hydrofera Blue over. We have not received insurance authorization for oasis. She will follow up next week. 06/07/18-She is seen in follow-up evaluation for left great toe ulcer. It has stalled according to today's measurements although base appears stable. She says she saw a diabetic educator yesterday; her average blood sugars are less than 300 which is an improvement for her. She continues to smoke and states "that's my next step" She continues with water over soda. We will order for xray, culture and reinstate ace wrap compression prior to placing apligraf for next week. She is voicing no complaints or concerns. Her dressing will change to iodoflex over the next week in preparation for apligraf. 06/14/18-She is seen in follow-up evaluation for left great toe ulcer. Plain film x-ray performed last week was negative for osteomyelitis. Wound culture obtained last week grew strep B and OSSA; she is initiated on keflex and cefdinir today; there is erythema to the toe which could be from ace wrap compression, she has a history of wrapping too tight and has has been encouraged to maintain ace wraps that we place today. We will hold off on application of apligraf today, will  apply next week after antibiotic therapy has been initiated. She admits today that she has resumed taking a shower with her foot/toe submerged in water, she has been reminded to keep foot/toe out of the bath water. She will be seen in follow up next week 06/21/18-she is seen in follow-up evaluation for left great toe ulcer. She is tolerating antibiotic therapy with no GI  disturbance. The wound is stable. Apligraf was applied today. She has been decreasing her smoking, only had 4 cigarettes yesterday and 1 today. She continues being more compliant in diabetic diet. She will follow-up next week for evaluation of site, if stable will remove at 2 weeks. 06/28/18- She is here in follow up evalution. Apligraf was placed last week, she states the dressing fell off on Tuesday and she was dressing with hydrofera blue. She is healed and will be discharged from the clinic today. She has been instructed to continue with smoking cessation, continue monitoring glucose levels, offloading for an additional 4 weeks and continue with hydrofera blue for additional two weeks for any possible microscopic opening. Readmission: 08/07/18 on evaluation today patient presents for reevaluation concerning the ulcer of her right great toe. She was previously discharged on 06/28/18 healed. Nonetheless she states that this began to show signs of drainage she subsequently went to her primary care provider. Subsequently an x-ray was performed on 08/01/18 which was negative. The patient was also placed on antibiotics at that time. Fortunately they should have been effective for the infection. Nonetheless she's been experiencing some improvement but still has a lot of drainage coming from the wound itself. 08/14/18 on evaluation today patient's wound actually does show signs of improvement in regard to the erythema at this point. She has completed the antibiotics. With that being said we did discuss the possibility of placing her in a total contact cast as of today although I think that I may want to give this just a little bit more time to ensure nothing recurrence as far as her infection is concerned. I do not want to put in the cast and risk infection at that time if things are not completely resolved. With that being said she is gonna require some debridement today. 08/21/18 on evaluation today  patient actually appears to be doing okay in regard to her toe ulcer. She's been tolerating the dressing changes without complication. With that being said it does appear that she is ready and in fact I think it's appropriate for [place] to go ahead and initiate the total contact cast today. Nonetheless she will require some sharp debridement to prepare the wound for application. Overall I feel like things have been progressing well but we do need to do something to get this to close more readily. 08/24/18 patient seen today for reevaluation after having had the total contact cast applied on Tuesday. She seems to have done very well the wound appears to be doing great and overall I'm pleased with the progress that she's made. There were no abnormal areas of rubbing from the cast on her lower extremity. 08/30/18 on evaluation today patient actually appears to be completely healed in regard to her plantar toe ulcer. She tells me at this point she's been having a lot of issues with the cast. She almost fell a couple of times the state shall the step of her dog a couple times as well. This is been a very frustrating process for her other nonetheless she has completely healed the wound which is excellent news.  Overall there does not appear to be the evidence of infection at this time which is great news. 09/11/18 evaluation today patient presents for follow-up concerning her great toe ulcer on the left which has unfortunately reopened since I last saw her which was only a couple of weeks ago. Unfortunately she was not able to get in to get the shoe and potentially the AFO that's gonna be necessary due to her left foot drop. She continues with offloading shoe but this is not enough to prevent her from reopening it appears. When we last had her in the total contact cast she did well from a healing standpoint but unfortunately the wound reopened as soon as she came out of the cast within just a couple of weeks.  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 This is also part of the reason why she falls on a regular basis. I just do not believe that is gonna be safe for [place] to reinitiate the total contact cast as last time we had this on she fell 3 times one week which is definitely not normal for her. 09/18/18 upon evaluation today the patient actually appears to be doing about the same in regard to her toe ulcer. She did not contact [employer] as I asked her to even though I had given her the prescription. In fact she actually states that she has no idea where the prescription is. She did apparently call [facility] and they told her that all she needed to do was bring the prescription in order to be able to be seen and work on getting the AFO for her left foot. With all that being said she still does not have an appointment and I'm not sure were things stand that regard. I will give her a new prescription today in order to contact them to get this set up. 09/25/18 on evaluation today patient actually appears to be doing about the same in regard to her toes ulcer. She does have a small areas which seems to have a lot of callous buildup around the edge of the wound which is going to need sharp debridement today. She still is waiting to be scheduled for evaluation with [facility] for possibility of an AFO. She states there supposed to call her tomorrow to get this set up. Unfortunately it does appear that her foot specifically the toe area is showing signs of erythema. There does not appear to be any systemic infection which is in these good news. [name], [name] ([id number]) 10/02/18 on evaluation today patient actually appears to be doing about the same in regard to her toe ulcer. This really has not done too well although  it's not significantly larger it's also not significantly smaller. She has been tolerating the dressing changes without complication. She actually has her appointment with [facility] and [facility] tomorrow to hopefully be measured for obtaining and AFO splint. I think this would be helpful preventing this from reoccurring. We had contemplated starting the cast this week although to be honest I am reluctant to do that as she's been having nausea, vomiting, and seizure activity over the past three days. She has a history of seizures and have been told is nothing that can be done for these. With that being said I do  believe that along with the seizures have the nausea vomiting which upon further questioning doesn't seem to be the normal for her and makes me concerned for the possibility of infection or something else going on. I discussed this with the patient and her mother during the office visit today. I do not feel the wound is effective but maybe something else. The responses this was "this just happens to her at times and we don't know why". They did not seem to be interested in going to the hospital to have this checked out further. 10/09/18 on evaluation today patient presents for follow-up concerning her ongoing toe ulcer. She has been tolerating the dressing changes without complication. Fortunately there does not appear to be any evidence of infection which is great news however I do think that the patient would benefit from going ahead for with the total contact cast. She's actually in a wheelchair today she tells me that she will use her walker if we initiate the cast. I was very specific about the fact that if we were gonna do the cast I wanted to make sure that she was using the walker in order to prevent any falls. She tells me she does not have stairs that she has to traverse on a regular basis at her home. She has not had any seizures since last week again that something that happens to her  often she tells me she did talk to [employer] and they said that it may take up to three weeks to get the brace approved for her. Hopefully that will not take that long but nonetheless in the meantime I do think the cast could be of benefit. 10/12/18 on evaluation today patient appears to be doing rather well in regard to her toe ulcer. It's just been a few days and already this is significantly improved both as far as overall appearance and size. Fortunately there's no sign of infection. She is here for her first obligatory cast change. 10/19/18 Seen today for follow up and management of left great toe ulcer. Wound continues to show improvement. Noted small open area with seroussang drainage with palpation. Denies any increased pain or recent fevers during visit. She will continue calcium alginate with offloading shoe. Denies any questions or concerns during visit. 10/26/18 on evaluation today patient appears to be doing about the same as when I last saw her in regard to her wound bed. Fortunately there does not appear to be any signs of infection. Unfortunately she continues to have a breakdown in regard to the toe region any time that she is not in the cast. It takes almost no time at all for this to happen. Nonetheless she still has not heard anything from the brace being made by [employer] as to when exactly this will be available to her. Fortunately there is no signs of infection at this time. 10/30/18 on evaluation today patient presents for application of the total contact cast as we just received him this morning. Fortunately we are gonna be able to apply this to her today which is great news. She continues to have no significant pain which is good news. Overall I do feel like things have been improving while she was the cast is when she doesn't have a cast that things get worse. She still has not really heard anything from [facility] regarding her brace. 11/02/18 upon evaluation today patient's wound  already appears to be doing significantly better which is good news. Fortunately there does not appear  to be any signs of infection also good news. Overall I do think the total contact cast as before is helping to heal this area unfortunately it's just not gonna likely keep the area closed and healed without her getting her brace at least. Again the foot drop is a significant issue for her. 11/09/18 on evaluation today patient appears to be doing excellent in regard to her toe ulcer which in fact is completely healed. Fortunately we finally got the situation squared away with the paperwork which was needed to proceed with getting her brace approved by Medicaid. I have filled that out unfortunately that information has been sent to the orthopedic office that I worked at 2 1/2 years ago and not tired Current wound care measures. Fortunately she seems to be doing very well at this time. 11/23/18 on evaluation today patient appears to be doing More Poorly Compared to Last Time I Saw Her. At [facility] She Had Completely Healed. Currently she is continuing to have issues with reopening. She states that she just found out that the brace was approved through Medicaid now she just has to go get measured in order to have this fitted for her and then made. Subsequently she does not have an appointment for this yet that is going to complicate things we obviously cannot put her back in the cast if we do not have everything measured because they're not gonna be able to measure her foot while she is in the cast. 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 Obviously this is unfortunate and does have me somewhat worried as well. 11/30/18 on evaluation today patient's toe ulcer actually appears to be doing fairly well. The good news is she will be getting her brace in the shoes next week on Wednesday. Hopefully we will be  able to get this to heal without having to go back in the cast however she may need the cast in order to get the wound completely heal and then go from there. Fortunately there's no signs of infection at this time. 12/07/18 on evaluation today patient fortunately did receive her brace and she states she could tell this definitely makes her walk better. With that being said she's been having issues with her toe where she noticed yesterday there was a lot of tissue that was loosing off this appears to be much larger than what it was previous. She also states that her leg has been read putting much across the top of her foot just about the ankle although this seems to be receiving somewhat. The total area is still red and appears to be someone infected as best I can tell. She is previously taken Bactrim and that may be a good option for her today as well. We are gonna see what I wound culture shows as well and I think that this is definitely appropriate. With that being said outside of the culture I still need to initiate something in the interim and that's what I'm gonna go ahead and select Bactrim is a good option for her. 12/14/18 on evaluation today patient appears to be doing better in regard to her left great toe ulcer as compared to last week's evaluation. There's still some erythema although this is significantly improved which is excellent news. Overall I do believe that she is making good progress is still gonna take some time before she is  where I would like her to be from the standpoint of being able to place her back into the total contact cast. Hopefully we will be where we need to be by next week. 12/21/18 on evaluation today patient actually appears to be doing poorly in regard to her toe ulcer. She's been tolerating the dressing changes without complication. Fortunately there's no signs of systemic infection although she does have a lot of drainage from the toe ulcer and this does seem to  be causing some issues at this point. She does have erythema on the distal portion of her toe that appears to be likely cellulitis. 12/28/18 on evaluation today patient actually appears to be doing a little better in my pinion in regard to her toe ulcer. With that being said she still does have some evidence of infection at this time and for her culture she had both E. coli as well as enterococcus as organisms noted on evaluation. For that reason I think that though the Keflex likely has treated the E. coli rather well this has really done nothing for the enterococcus. We are going to have to initiate treatment for this specifically. CLODA, ZUNINO A. ([id number]) 01/04/19 on evaluation today patient's toe actually appears to be doing better from the standpoint of infection. She currently would like to see about putting the cash back on I think that this is appropriate as long as she takes care of it and keeps it from getting wet. She is gonna have some drainage we can definitely pass this up with Drawtex and alginate to try to prevent as much drainage as possible from causing the problems. With that being said I do want to at least try her with the cast between now and Tuesday. If there any issues we can't continue to use it then I will discontinue the use of the cast at that point. 01/08/19 on evaluation today patient actually appears to be doing very well as far as her foot ulcer specifically the great toe on the left is concerned. She did have an area of rubbing on the medial aspect of her left ankle which again is from the cast. Fortunately there's no signs of infection at this point in this appears to be a very slight skin breakdown. The patient tells me she felt it rubbing but didn't think it was that bad. Fortunately there is no signs of active infection at this time which is good news. No fevers, chills, nausea, or vomiting noted at this time. 01/15/19 on evaluation today patient actually appears  to be doing well in regard to her toe ulcer. Again as previous she seems to do well and she has the cast on which indicates to me that during the time she doesn't have a cast on she's putting way too much pressure on this region. Obviously I think that's gonna be an issue as with the current national emergency concerning the Covid-19 Virus it has been recommended that we discontinue the use of total contact casting by the chief medical officer of our company, Dr. Simona Huh. The reasoning is that if a patient becomes sick and cannot come into have the cast removed they could not just leave this on for an additional two weeks. Obviously the hospitals also do not want to receive patient's who are sick into the emergency department to potentially contaminate the region and spread the Covid-19 Virus among other sick individuals within the hospital system. Therefore at this point we are suspending the use of total  contact cast until the current emergency subsides. This was all discussed with the patient today as well. 01/22/19 on evaluation today patient's wound on her left great toe appears to be doing slightly worse than previously noted last week. She tells me that she has been on this quite a bit in fact she tells me she's been awake for 38 straight hours. This is due to the fact that she's having to care for grandparents because nobody else will. She has been taking care of them for five the last seven days since I've seen her they both have dementia his is from a stroke and her grandmother's was progressive. Nonetheless she states even her mom who knows her condition and situation has only help two of those days to take care of them she's been taking care of the rest. Fortunately there does not appear to be any signs of active infection in regard to her toe at this point although obviously it doesn't look as good as it did previous. I think this is directly related to her not taking off the pressure and  friction by way of taking things easy. Though I completely understand what's going on. 01/29/19 on evaluation today patient's tools are actually appears to be showing some signs of improvement today compared to last week's evaluation as far as not necessarily the overall size of the wound but the fact that she has some new skin growth in between the two ends of the wound opening. Overall I feel like she has done well she states that she had a family member give her what sounds to be a CAM walker boot which has been helpful as well. 02/05/19 on evaluation today patient's wound bed actually appears to be doing significantly better in regard to her overall appearance of the size of the wound. With that being said she is still having an issue with offloading efficiently enough to get this to close. Apparently there is some signs of infection at this point as well unfortunately. Previously she's done well of Augmentin I really do not see anything that needs to be culture currently but there theme and cellulitis of the foot that I'm seeing I'm gonna go ahead and place her on an antibiotic today to try to help clear this up. 02/12/2019 on evaluation today patient actually appears to be doing poorly in regard to her overall wound status. She tells me she has been using her offloading shoe but actually comes in today wearing her tennis shoe with the AFO brace. Again as I previously discussed with her this is really not sufficient to allow the area to heal appropriately. Nonetheless she continues to be somewhat noncompliant and I do wonder based on what she has told my nurse in the past as to whether or not a good portion of this noncompliance may be recreational drug and alcohol related. She has had a history of heroin overdose and this was fairly recently in the past couple of months that have been seeing her. Nonetheless overall I feel like her wound looks significantly worse today compared to what it was  previous. She still has significant erythema despite the Augmentin I am not sure that this is an appropriate medication for her infection I am also concerned that the infection may have gone down into her bone. 02/19/19 on evaluation today patient actually appears to be doing about the same in regard to her toe ulcer. Unfortunately she continues to show signs of bone exposure and infection at this  point. There does not appear to be any evidence of worsening of the infection but I'm also not really sure that it's getting significantly better. She is on the Augmentin which should be sufficient for the Staphylococcus aureus infection that she has at this point. With that being said she may need IV antibiotics to more appropriately treat this. We did have a discussion today about hyperbaric option therapy. 02/28/19 on evaluation today patient actually appears to be doing much worse in regard to the wound on her left great toe as compared to even my previous evaluation last week. Unfortunately this seems to be training in a pretty poor direction. Her toe was actually now starting to angle laterally and I can actually see the entire joint area of the proximal portion of the digit where is the distal portion of the digit again is no longer even in contact with the joint line. Unfortunately there's a lot more necrotic tissue around the edge and the toe appears to be showing signs of becoming gangrenous in my pinion. I'm very concerned about were things stand at this point. She did see infectious disease and they are planning to send in a prescription for Sivextro for her and apparently this has been approved. With that being said I don't think she should avoid taking this but at the same time I'm not sure that it's gonna be sufficient to save her toe at this point. She tells me that she still having to care for grandparents which I think is putting quite a bit of strain on her foot and specifically the total area  and has caused this to break down even to a greater degree than would've otherwise been expected. 03/05/19 on evaluation today patient actually appears to be doing quite well in regard to her toe all things considering. She still has bone exposed but there appears to be much less your thing on overall the appearance of the wound and the toe itself is dramatically improved. She still does have some issues currently obviously with infection she did see vascular as well and there concerned that her blood flow to the toad. For that reason they are setting up for an angiogram next week. 03/14/19 on evaluation today patient appears to be doing very poor in regard to her toe and specifically in regard to the ulceration and the fact that she's starting to notice the toe was leaning even more towards the lateral aspect and the complete joint is visible on the proximal aspect of the joint. Nonetheless she's also noted a significant odor and the tip of the toe is turning more dark and necrotic appearing. Overall I think she is getting worse not better as far as this is concerned. For that reason I am recommending at this point that she likely needs to be seen for likely amputation. READMISSION 03/19/2021 This is a patient that we cared for in this clinic for a prolonged period of time in 2019 and 2020 with a left foot and left first toe wound. 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 ([id number]) 04/09/20 by Dr. Luana Shu. In December 2021 she had an ulcer on her right great toe as well as the fourth and fifth toes. 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 She also had an angiogram at that time and underwent angioplasty of the right anterior tibial  artery. In any case she claims that the wound on the right foot is closed I did not look at this today which was  probably an oversight although I think that should be done next week. After her surgery she developed a dehiscence but I do not see any follow-up of this. According to Dr. Deborra Medina last review that she was out of the area being cared for by another physician but recently came back to his attention. 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 A culture of this area showed E. coli apparently before she came back to see Dr. Luana Shu she was supposed to be receiving antibiotics but she did not really take them. Nor is she offloading this area at all. Finally her last hemoglobin A1c listed in epic was in March 2022 at 14.1 she says things are a lot better since then although I am not sure. 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 She had this wound at the time. During this admission she had E. coli sepsis a TEE was negative. 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 She is still a heavy cigarette smoker. She has a history of bipolar disorder seizure disorder ABI in our clinic was 1.05 6/1; left midfoot in the setting of a TMA done previously. Round circular wound with a "knuckle" of protruding tissue. The problem is that the knuckle was not attached to any of the surrounding granulation and this probed proximally widely I removed a large portion of this tissue. This wound goes with considerable undermining laterally. I do not feel any bone there was no purulence but this is a deep wound. Objective Constitutional Sitting or standing Blood Pressure is within target range for patient.. Pulse regular and within target range for patient.Marland Kitchen Respirations regular,  non- labored and within target range.. Temperature is normal and within the target range for the patient.Marland Kitchen appears in no distress. Vitals Time Taken: 12:45 PM, Height: 69 in, Weight: 185 lbs, BMI: 27.3, Temperature: 98.9 F, Pulse: 114 bpm, Respiratory Rate: 20 breaths/min, Blood Pressure: 123/85 mmHg. General Notes: Wound exam; left plantar foot. The wound looked a little better than last week not quite as lifeless and pale nevertheless the center tissue nonadherent to the surrounding granulation. Almost a protruding area of nonattached tissue. I removed this. This is all subcutaneous tissue. Once this was removed there was bleeding which was controlled with pressure and silver nitrate this does not go to bone or the muscle layer of the foot but it does undermine considerably. I did not see anything worth culturing at this point Integumentary (Hair, Skin) Wound #7 status is Open. Original cause of wound was Gradually Appeared. The date acquired was: 08/07/2020. The wound is located on the [place]. The wound measures 2cm length x 2cm width x 1cm depth; 3.142cm^2 area and 3.142cm^3 volume. There is Fat Layer (Subcutaneous Tissue) exposed. There is no tunneling noted, however, there is undermining starting at 4:00 and ending at 6:00 with a maximum distance of 1cm. There is a medium amount of serous drainage noted. The wound margin is epibole. There is large (67-100%) pale granulation within the wound bed. There is a small (1-33%) amount of necrotic tissue within the wound bed  including Warner. Assessment Active Problems ICD-10 Type 2 diabetes mellitus with foot ulcer 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ity Type 2 diabetes mellitus with diabetic polyneuropathy Procedures Wound #7 Pre-procedure diagnosis of Wound #7 is a Pressure Ulcer located on the Left,Midline,Plantar Foot . There was a Excisional Skin/Subcutaneous Tissue  Debridement with a total area of 4 sq cm performed by Ricard [name], MD. With the following instrument(s): Curette to remove [name], [name] ([id number]) Non-Viable tissue/material. Material removed includes Subcutaneous Tissue. A time out was conducted at 13:06, prior to the start of the procedure. A Moderate amount of bleeding was controlled with Silver Nitrate. The procedure was tolerated well. Post Debridement Measurements: 2cm length x 2cm width x 1cm depth; 3.142cm^3 volume. Post debridement Stage noted as Category/Stage II. Character of Wound/Ulcer Post Debridement is stable. Post procedure Diagnosis Wound #7: Same as Pre-Procedure Plan Follow-up Appointments: Return Appointment in 1 week. Bathing/ Shower/ Hygiene: Clean wound with Normal Saline or wound cleanser. Radiology ordered were: X-ray, foot - Left foot WOUND #7: - Foot Wound Laterality: Plantar, Left, Midline Cleanser: Soap and Water 3 x Per Week/30 Days Discharge Instructions: Gently cleanse wound with antibacterial soap, rinse and pat dry prior to dressing wounds Primary Dressing: Prisma 4.34 (in) 3 x Per Week/30 Days Discharge Instructions: Moisten with hydrogel-tuck in undermining area Secondary Dressing: Bordered Gauze Sterile-HBD 4x4 (in/in) 3 x Per Week/30 Days Discharge Instructions: Cover wound with Bordered Guaze Sterile as directed 1. Continuing with the silver collagen which the patient is changing her self 2. She is in her surgical shoe she may require more aggressive offloading here up to including a total contact cast once we assured ourselves that there is no active infection 3. X-ray of the left foot. If this is negative she is likely to require an MRI 4. I looked at her right foot today there was nothing open here Electronic Signature(s) Signed: 03/24/2021 5:08:05 PM By: Linton Ham MD Entered By: Linton Ham on 03/24/2021 13:17:44 [name]  ([id number]) -------------------------------------------------------------------------------- [place] Details Patient Name: Heather Dear A. Date of Service: 03/24/2021 Medical Record Number: [id number] Patient Account Number: [id number] Date of Birth/Sex: Mar 06, 1976 (45 y.o. F) Treating RN: Cornell Barman Primary Care Provider: Raelene Bott Other Clinician: Referring Provider: Raelene Bott Treating Provider/Extender: Tito Dine in Treatment: 0 Diagnosis Coding ICD-10 Codes Code Description E11.621 Type 2 diabetes mellitus with foot ulcer 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ity E11.42 Type 2 diabetes mellitus with diabetic polyneuropathy Facility Procedures CPT4 Code: [id number] Description: [id number] - DEB SUBQ TISSUE 20 SQ CM/< Modifier: Quantity: 1 CPT4 Code: Description: ICD-10 Diagnosis Description 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 Modifier: severity Quantity: Physician Procedures CPT4 Code: [id number] Description: 11042 - WC PHYS SUBQ TISS 20 SQ CM Modifier: Quantity: 1 CPT4 Code: Description: ICD-10 Diagnosis Description 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 Modifier: severity Quantity: Electronic Signature(s) Signed: 03/24/2021 5:08:05 PM By: Linton Ham MD Entered By: Linton Ham on 03/24/2021 13:18:01

## 2021-03-25 NOTE — Progress Notes (Signed)
[name], [name] ([id number]) Visit Report for 03/24/2021 Arrival Information Details Patient Name: [name], [name] Date of Service: 03/24/2021 12:30 PM Medical Record Number: [id number] Patient Account Number: [id number] Date of Birth/Sex: 1976/02/08 (45 y.o. F) Treating RN: Donnamarie Poag Primary Care Robley Matassa: Raelene Bott Other Clinician: Referring Callin Ashe: Raelene Bott Treating Lorree Millar/Extender: Tito Dine in Treatment: 0 Visit Information History Since Last Visit Added or deleted any medications: No Patient Arrived: Wheel Chair Had a fall or experienced change in No Arrival Time: 12:47 activities of daily living that may affect Accompanied By: mother risk of falls: Transfer Assistance: None Hospitalized since last visit: No Patient Identification Verified: Yes Has Dressing in Place as Prescribed: Yes Secondary Verification Process Completed: Yes Pain Present Now: Yes Patient Requires Transmission-Based No Precautions: Patient Has Alerts: Yes Patient Alerts: Patient on Blood Thinner '81mg'$  apirin Diabetic Type II Electronic Signature(s) Signed: 03/24/2021 1:12:55 PM By: Donnamarie Poag Entered By: Donnamarie Poag on 03/24/2021 13:12:55 [name], [name] [name] ([id number]) -------------------------------------------------------------------------------- Clinic Level of Care Assessment Details Patient Name: Heather Dear A. Date of Service: 03/24/2021 12:30 PM Medical Record Number: [id number] Patient Account Number: [id number] Date of Birth/Sex: 1975-11-12 (45 y.o. F) Treating RN: Dolan Amen Primary Care Juliani Laduke: Raelene Bott Other Clinician: Referring Elisabeth Strom: Raelene Bott Treating Cherl Gorney/Extender: Tito Dine in Treatment: 0 Clinic Level of Care Assessment Items TOOL 1 Quantity Score '[]'$  - Use when EandM and Procedure is performed on INITIAL visit 0 ASSESSMENTS - Nursing Assessment / Reassessment '[]'$  - General Physical Exam (combine w/  comprehensive assessment (listed just below) when performed on new 0 pt. evals) '[]'$  - 0 Comprehensive Assessment (HX, ROS, Risk Assessments, Wounds Hx, etc.) ASSESSMENTS - Wound and Skin Assessment / Reassessment '[]'$  - Dermatologic / Skin Assessment (not related to wound area) 0 ASSESSMENTS - Ostomy and/or Continence Assessment and Care '[]'$  - Incontinence Assessment and Management 0 '[]'$  - 0 Ostomy Care Assessment and Management (repouching, etc.) PROCESS - Coordination of Care '[]'$  - Simple Patient / Family Education for ongoing care 0 '[]'$  - 0 Complex (extensive) Patient / Family Education for ongoing care '[]'$  - 0 Staff obtains Programmer, systems, Records, Test Results / Process Orders '[]'$  - 0 Staff telephones HHA, Nursing Homes / Clarify orders / etc '[]'$  - 0 Routine Transfer to another Facility (non-emergent condition) '[]'$  - 0 Routine Hospital Admission (non-emergent condition) '[]'$  - 0 New Admissions / Biomedical engineer / Ordering NPWT, Apligraf, etc. '[]'$  - 0 Emergency Hospital Admission (emergent condition) PROCESS - Special Needs '[]'$  - Pediatric / Minor Patient Management 0 '[]'$  - 0 Isolation Patient Management '[]'$  - 0 Hearing / Language / Visual special needs '[]'$  - 0 Assessment of Community assistance (transportation, D/C planning, etc.) '[]'$  - 0 Additional assistance / Altered mentation '[]'$  - 0 Support Surface(s) Assessment (bed, cushion, seat, etc.) INTERVENTIONS - Miscellaneous '[]'$  - External ear exam 0 '[]'$  - 0 Patient Transfer (multiple staff / Civil Service fast streamer / Similar devices) '[]'$  - 0 Simple Staple / Suture removal (25 or less) '[]'$  - 0 Complex Staple / Suture removal (26 or more) '[]'$  - 0 Hypo/Hyperglycemic Management (do not check if billed separately) '[]'$  - 0 Ankle / Brachial Index (ABI) - do not check if billed separately Has the patient been seen at the hospital within the last three years: Yes Total Score: 0 Level Of Care: ____ [name] ([id number]) Electronic  Signature(s) Signed: 03/24/2021 4:52:16 PM By: Georges Mouse, Minus Breeding RN Entered By: Georges Mouse, Minus Breeding on 03/24/2021 13:11:33 [name], [name] ([id number]) --------------------------------------------------------------------------------  Encounter Discharge Information Details Patient Name: BUSTO, [name] Date of Service: 03/24/2021 12:30 PM Medical Record Number: [id number] Patient Account Number: [id number] Date of Birth/Sex: 11-Oct-1976 (45 y.o. F) Treating RN: Donnamarie Poag Primary Care Aubrina Nieman: Raelene Bott Other Clinician: Referring Josia Cueva: Raelene Bott Treating Takoda Siedlecki/Extender: Tito Dine in Treatment: 0 Encounter Discharge Information Items Post Procedure Vitals Discharge Condition: Stable Temperature (F): 97.8 Ambulatory Status: Wheelchair Pulse (bpm): 114 Discharge Destination: Home Respiratory Rate (breaths/min): 18 Transportation: Private Auto Blood Pressure (mmHg): 123/85 Accompanied By: mother Schedule Follow-up Appointment: Yes Clinical Summary of Care: Electronic Signature(s) Signed: 03/25/2021 9:13:32 AM By: Donnamarie Poag Entered By: Donnamarie Poag on 03/24/2021 13:23:56 [name], [name] [name] ([id number]) -------------------------------------------------------------------------------- Lower Extremity Assessment Details Patient Name: Heather Dear A. Date of Service: 03/24/2021 12:30 PM Medical Record Number: [id number] Patient Account Number: [id number] Date of Birth/Sex: [birth year] (45 y.o. F) Treating RN: Donnamarie Poag Primary Care Janeka Libman: Raelene Bott Other Clinician: Referring Juan Olthoff: Raelene Bott Treating Jeovany Huitron/Extender: Ricard [name] Weeks in Treatment: 0 Edema Assessment Assessed: [Left: Yes] [Right: Yes] Edema: [Left: No] [Right: No] Vascular Assessment Pulses: Dorsalis Pedis Palpable: [Right:Yes] Electronic Signature(s) Signed: 03/25/2021 9:13:32 AM By: Donnamarie Poag Entered By: Donnamarie Poag on 03/24/2021 12:56:56 [name],  [name] ([id number]) -------------------------------------------------------------------------------- Multi Wound Chart Details Patient Name: Heather Dear A. Date of Service: 03/24/2021 12:30 PM Medical Record Number: [id number] Patient Account Number: [id number] Date of Birth/Sex: [birth year] (45 y.o. F) Treating RN: Dolan Amen Primary Care Keaunna Skipper: Raelene Bott Other Clinician: Referring Ciera Beckum: Raelene Bott Treating Kylil Swopes/Extender: Tito Dine in Treatment: 0 Vital Signs Height(in): 69 Pulse(bpm): 114 Weight(lbs): 185 Blood Pressure(mmHg): 123/85 Body Mass Index(BMI): 27 Temperature(F): 98.9 Respiratory Rate(breaths/min): 20 Photos: [N/A:N/A] Wound Location: Left, Midline, Plantar Foot N/A N/A Wounding Event: Gradually Appeared N/A N/A Primary Etiology: Pressure Ulcer N/A N/A Comorbid History: Chronic sinus problems/congestion, N/A N/A Middle ear problems, Anemia, Chronic Obstructive Pulmonary Disease (COPD), Congestive Heart Failure, Type II Diabetes, End Stage Renal Disease, History of pressure wounds, Neuropathy Date Acquired: 08/07/2020 N/A N/A Weeks of Treatment: 0 N/A N/A Wound Status: Open N/A N/A Measurements L x W x D (cm) 2x2x1 N/A N/A Area (cm) : 3.142 N/A N/A Volume (cm) : 3.142 N/A N/A % Reduction in Area: -17.70% N/A N/A % Reduction in Volume: -135.40% N/A N/A Starting Position 1 (o'clock): 4 Ending Position 1 (o'clock): 6 Maximum Distance 1 (cm): 1 Undermining: Yes N/A N/A Classification: Category/Stage II N/A N/A Exudate Amount: Medium N/A N/A Exudate Type: Serous N/A N/A Exudate Color: amber N/A N/A Wound Margin: Epibole N/A N/A Granulation Amount: Large (67-100%) N/A N/A Granulation Quality: Pale N/A N/A Necrotic Amount: Small (1-33%) N/A N/A Exposed Structures: Fat Layer (Subcutaneous Tissue): N/A N/A Yes Fascia: No Tendon: No Muscle: No Joint: No Bone: No Epithelialization: None N/A N/A Debridement:  Debridement - Excisional N/A N/A Pre-procedure Verification/Time 13:06 N/A N/A Out Taken: Tissue Debrided: Subcutaneous N/A N/A Level: Skin/Subcutaneous Tissue N/A N/A [name], [name] ([id number]) Debridement Area (sq cm): 4 N/A N/A Instrument: Curette N/A N/A Bleeding: Moderate N/A N/A Hemostasis Achieved: Silver Nitrate N/A N/A Debridement Treatment Procedure was tolerated well N/A N/A Response: Post Debridement 2x2x1 N/A N/A Measurements L x W x D (cm) Post Debridement Volume: 3.142 N/A N/A (cm) Post Debridement Stage: Category/Stage II N/A N/A Procedures Performed: Debridement N/A N/A Treatment Notes Electronic Signature(s) Signed: 03/24/2021 5:08:05 PM By: Linton Ham MD Entered By: Linton Ham on 03/24/2021 13:14:01 [name] ([id number]) -------------------------------------------------------------------------------- [place] Details Patient Name: Heather Dear A. Date  of Service: 03/24/2021 12:30 PM Medical Record Number: [id number] Patient Account Number: [id number] Date of Birth/Sex: Feb 19, 1976 (45 y.o. F) Treating RN: Dolan Amen Primary Care Uriyah Massimo: Raelene Bott Other Clinician: Referring Amaya Blakeman: Raelene Bott Treating Xerxes Agrusa/Extender: Tito Dine in Treatment: 0 Active Inactive Abuse / Safety / Falls / Self Care Management Nursing Diagnoses: Potential for injury related to falls Goals: Patient/caregiver will verbalize understanding of skin care regimen Date Initiated: 03/19/2021 Target Resolution Date: 04/19/2021 Goal Status: Active Patient/caregiver will verbalize/demonstrate measures taken to prevent injury and/or falls Date Initiated: 03/19/2021 Target Resolution Date: 04/19/2021 Goal Status: Active Interventions: Assess Activities of Daily Living upon admission and as needed Assess fall risk on admission and as needed Assess: immobility, friction, shearing, incontinence upon admission and as  needed Assess impairment of mobility on admission and as needed per policy Assess personal safety and home safety (as indicated) on admission and as needed Assess self care needs on admission and as needed Notes: Wound/Skin Impairment Nursing Diagnoses: Knowledge deficit related to ulceration/compromised skin integrity Goals: Patient/caregiver will verbalize understanding of skin care regimen Date Initiated: 03/19/2021 Target Resolution Date: 04/19/2021 Goal Status: Active Ulcer/skin breakdown will have a volume reduction of 30% by week 4 Date Initiated: 03/19/2021 Target Resolution Date: 04/19/2021 Goal Status: Active Ulcer/skin breakdown will have a volume reduction of 50% by week 8 Date Initiated: 03/19/2021 Target Resolution Date: 05/19/2021 Goal Status: Active Ulcer/skin breakdown will have a volume reduction of 80% by week 12 Date Initiated: 03/19/2021 Target Resolution Date: 06/19/2021 Goal Status: Active Ulcer/skin breakdown will heal within 14 weeks Date Initiated: 03/19/2021 Target Resolution Date: 07/20/2021 Goal Status: Active Interventions: Assess patient/caregiver ability to obtain necessary supplies Assess patient/caregiver ability to perform ulcer/skin care regimen upon admission and as needed Assess ulceration(s) every visit Notes: [name], [name] ([id number]) Electronic Signature(s) Signed: 03/24/2021 4:52:16 PM By: Georges Mouse, Minus Breeding RN Entered By: Georges Mouse, Minus Breeding on 03/24/2021 12:59:54 [name] ([id number]) -------------------------------------------------------------------------------- Pain Assessment Details Patient Name: Heather Dear A. Date of Service: 03/24/2021 12:30 PM Medical Record Number: [id number] Patient Account Number: [id number] Date of Birth/Sex: May 03, 1976 (45 y.o. F) Treating RN: Donnamarie Poag Primary Care Caoilainn Sacks: Raelene Bott Other Clinician: Referring Doral Digangi: Raelene Bott Treating Zarea Diesing/Extender: Tito Dine in Treatment: 0 Active Problems Location of Pain Severity and Description of Pain Patient Has Paino No Site Locations Rate the pain. Current Pain Level: 6 Pain Management and Medication Current Pain Management: Electronic Signature(s) Signed: 03/25/2021 9:13:32 AM By: Donnamarie Poag Entered By: Donnamarie Poag on 03/24/2021 12:49:11 [name] ([id number]) -------------------------------------------------------------------------------- Patient/Caregiver Education Details Patient Name: Heather Dear A. Date of Service: 03/24/2021 12:30 PM Medical Record Number: [id number] Patient Account Number: [id number] Date of Birth/Gender: 10/08/1976 (45 y.o. F) Treating RN: Dolan Amen Primary Care Physician: Raelene Bott Other Clinician: Referring Physician: Raelene Bott Treating Physician/Extender: Tito Dine in Treatment: 0 Education Assessment Education Provided To: Patient Education Topics Provided Wound/Skin Impairment: Methods: Explain/Verbal Responses: State content correctly Electronic Signature(s) Signed: 03/24/2021 4:52:16 PM By: Georges Mouse, Minus Breeding RN Entered By: Georges Mouse, Minus Breeding on 03/24/2021 13:11:45 [name] ([id number]) -------------------------------------------------------------------------------- Wound Assessment Details Patient Name: Heather Dear A. Date of Service: 03/24/2021 12:30 PM Medical Record Number: [id number] Patient Account Number: [id number] Date of Birth/Sex: [birth year] (45 y.o. F) Treating RN: Donnamarie Poag Primary Care Briggs Edelen: Raelene Bott Other Clinician: Referring Nihira Puello: Raelene Bott Treating Kazandra Forstrom/Extender: Tito Dine in Treatment: 0 Wound Status Wound Number: 7 Primary Pressure Ulcer Etiology: Wound Location: Left, Midline, Plantar Foot  Wound Open Wounding Event: Gradually Appeared Status: Date Acquired: 08/07/2020 Comorbid Chronic sinus problems/congestion, Middle ear  problems, Weeks Of Treatment: 0 History: Anemia, Chronic Obstructive Pulmonary Disease (COPD), Clustered Wound: No Congestive Heart Failure, Type II Diabetes, End Stage Renal Disease, History of pressure wounds, Neuropathy Photos Wound Measurements Length: (cm) 2 % Width: (cm) 2 % Depth: (cm) 1 E Area: (cm) 3.142 Volume: (cm) 3.142 Reduction in Area: -17.7% Reduction in Volume: -135.4% pithelialization: None Tunneling: No Undermining: Yes Starting Position (o'clock): 4 Ending Position (o'clock): 6 Maximum Distance: (cm) 1 Wound Description Classification: Category/Stage II Wound Margin: Epibole Exudate Amount: Medium Exudate Type: Serous Exudate Color: amber Foul Odor After Cleansing: No Slough/Fibrino Yes Wound Bed Granulation Amount: Large (67-100%) Exposed Structure Granulation Quality: Pale Fascia Exposed: No Necrotic Amount: Small (1-33%) Fat Layer (Subcutaneous Tissue) Exposed: Yes Necrotic Quality: Adherent Slough Tendon Exposed: No Muscle Exposed: No Joint Exposed: No Bone Exposed: No Treatment Notes Wound #7 (Foot) Wound Laterality: Plantar, Left, Midline [name], [name] ([id number]) Cleanser Soap and Water Discharge Instruction: Gently cleanse wound with antibacterial soap, rinse and pat dry prior to dressing wounds Peri-Wound Care Topical Primary Dressing Prisma 4.34 (in) Discharge Instruction: Moisten with hydrogel-tuck in undermining area Secondary Dressing Bordered Gauze Sterile-HBD 4x4 (in/in) Discharge Instruction: Cover wound with Bordered Guaze Sterile as directed Secured With Compression Wrap Compression Stockings Add-Ons Electronic Signature(s) Signed: 03/25/2021 9:13:32 AM By: Donnamarie Poag Entered By: Donnamarie Poag on 03/24/2021 12:54:59 [name], [name] ([id number]) -------------------------------------------------------------------------------- [place] Details Patient Name: Heather Dear A. Date of Service: 03/24/2021 12:30  PM Medical Record Number: [id number] Patient Account Number: [id number] Date of Birth/Sex: 1976-06-04 (45 y.o. F) Treating RN: Donnamarie Poag Primary Care Tristen Pennino: Raelene Bott Other Clinician: Referring Blima Jaimes: Raelene Bott Treating Dellie Piasecki/Extender: Tito Dine in Treatment: 0 Vital Signs Time Taken: 12:45 Temperature (F): 98.9 Height (in): 69 Pulse (bpm): 114 Weight (lbs): 185 Respiratory Rate (breaths/min): 20 Body Mass Index (BMI): 27.3 Blood Pressure (mmHg): 123/85 Reference Range: 80 - 120 mg / dl Electronic Signature(s) Signed: 03/25/2021 9:13:32 AM By: Donnamarie Poag Entered ByDonnamarie Poag on 03/24/2021 12:49:00

## 2021-03-31 ENCOUNTER — Other Ambulatory Visit: Payer: Self-pay

## 2021-03-31 ENCOUNTER — Encounter: Payer: Medicaid Other | Admitting: Internal Medicine

## 2021-03-31 ENCOUNTER — Other Ambulatory Visit
Admission: RE | Admit: 2021-03-31 | Discharge: 2021-03-31 | Disposition: A | Discharge status: Home / Self Care | Payer: Medicaid Other | Source: Ambulatory Visit | Attending: Internal Medicine | Admitting: Internal Medicine

## 2021-03-31 ENCOUNTER — Other Ambulatory Visit (HOSPITAL_BASED_OUTPATIENT_CLINIC_OR_DEPARTMENT_OTHER): Payer: Self-pay | Admitting: Internal Medicine

## 2021-03-31 DIAGNOSIS — L089 Local infection of the skin and subcutaneous tissue, unspecified: Secondary | ICD-10-CM | POA: Diagnosis not present

## 2021-04-01 NOTE — Progress Notes (Signed)
[name], [name] ([id number]) Visit Report for 03/31/2021 Debridement Details Patient Name: [name], [name] Date of Service: 03/31/2021 12:45 PM Medical Record Number: [id number] Patient Account Number: [id number] Date of Birth/Sex: 1976/02/23 (45 y.o. F) Treating RN: Cornell Barman Primary Care Provider: Raelene Bott Other Clinician: Referring Provider: Raelene Bott Treating Provider/Extender: Tito Dine in Treatment: 1 Debridement Performed for Wound #7 Left,Midline,Plantar Foot Assessment: Performed By: Physician Ricard [name], MD Debridement Type: Debridement Level of Consciousness (Pre- Awake and Alert procedure): Pre-procedure Verification/Time Out Yes - 13:15 Taken: Start Time: 13:15 Pain Control: Lidocaine Total Area Debrided (L x W): 1.8 (cm) x 1.6 (cm) = 2.88 (cm) Tissue and other material Viable, Non-Viable, Subcutaneous debrided: Level: Skin/Subcutaneous Tissue Debridement Description: Excisional Instrument: Blade, Curette, Forceps Bleeding: Moderate Hemostasis Achieved: Silver Nitrate Response to Treatment: Procedure was tolerated well Level of Consciousness (Post- Awake and Alert procedure): Post Debridement Measurements of Total Wound Length: (cm) 1.8 Stage: Category/Stage II Width: (cm) 1.6 Depth: (cm) 2.2 Volume: (cm) 4.976 Character of Wound/Ulcer Post Debridement: Stable Post Procedure Diagnosis Same as Pre-procedure Electronic Signature(s) Signed: 03/31/2021 4:14:58 PM By: Linton Ham MD Signed: 03/31/2021 4:36:28 PM By: Gretta Cool, BSN, RN, CWS, [name] RN, BSN Entered By: Linton Ham on 03/31/2021 13:32:45 Kling, Armoni A. ([id number]) -------------------------------------------------------------------------------- HPI Details Patient Name: Heather Dear A. Date of Service: 03/31/2021 12:45 PM Medical Record Number: [id number] Patient Account Number: [id number] Date of Birth/Sex: July 27, 1976 (45 y.o. F) Treating RN: Cornell Barman Primary Care Provider: Raelene Bott Other Clinician: Referring Provider: Raelene Bott Treating Provider/Extender: Tito Dine in Treatment: 1 History of Present Illness HPI Description: 01/18/18-She is here for initial evaluation of the left great toe ulcer. She is a poor historian in regards to timeframe in detail. She states approximately 4 weeks ago she lacerated her toe on something in the house. She followed up with her primary care who placed her on Bactrim and ultimately a second dose of Bactrim prior to coming to wound clinic. She states she has been treating the toe with peroxide, Betadine and a Band-Aid. She did not check her blood sugar this morning but checked it yesterday morning it was 327; she is unaware of a recent A1c and there are no current records. She saw Dr. she would've orthopedics last week for an old injury to the left ankle, she states he did not see her toe, nor did she bring it to his attention. She smokes approximately 1 pack cigarettes a day. Her social situation is concerning, she arrives this morning with her mother who appears extremely intoxicated/under the influence; her mother was asked to leave the room and be monitored by the patient's grandmother. The patient's aunt then accompanied the patient and the room throughout the rest of the appointment. We had a lengthy discussion regarding the deleterious effects of uncontrolled hyperglycemia and smoking as it relates to wound healing and overall health. She was strongly encouraged to decrease her smoking and get her diabetes under better control. She states she is currently on a diet and has cut down her [facility] consumption. The left toe is erythematous, macerated and slightly edematous with malodor present. The edema in her left foot is below her baseline, there is no erythema streaking. We will treat her with Santyl, doxycycline; we have ordered and xray, culture and provided a Peg assist  surgical shoe and cultured the wound. 01/25/18-She is here in follow-up evaluation for a left great toe ulcer and presents with an abscess to her suprapubic  area. She states her blood sugars remain elevated, feeling "sick" and if levels are below 250, but she is trying. She has made no attempt to decrease her smoking stating that we "can't take away her food in her cigarettes". She has been compliant with offloading using the PEG assist you. She is using Santyl daily. the culture obtained last week grew staph aureus and Enterococcus faecalis; continues on the doxycycline and Augmentin was added on Monday. The suprapubic area has erythema, no femoral variation, purple discoloration, minimal induration, was accessed with a cotton tip applicator with sanguinopurulent drainage, this was cultured, I suspect the current antibiotic treatment will cover and we will not add anything to her current treatment plan. She was advised to go to urgent care or ER with any change in redness, induration or fever. 02/01/18-She is here in follow-up evaluation for left great toe ulcers and a new abdominal abscess from last week. She was able to use packing until earlier this week, where she "forgot it was there". She states she was feeling ill with GI symptoms last week and was not taking her antibiotic. She states her glucose levels have been predominantly less than 200, with occasional levels between 200-250. She thinks this was contributing to her GI symptoms as they have resolved without intervention. There continues to be significant laceration to left toe, otherwise it clinically looks stable/improved. There is now less superficial opening to the lateral aspect of the great toe that was residual blister. We will transition to [facility] to all wounds, she will continue her Augmentin. If there is no change or deterioration next week for reculture. 02/08/18-She is here in follow-up evaluation for left great toe ulcer  and abdominal ulcer. There is an improvement in both wounds. She has been wrapping her left toe with coban, not by our direction, which has created an area of discoloration to the medial aspect; she has been advised to NOT use coban secondary to her neuropathy. She states her glucose levels have been high over this last week ranging from 200-350, she continues to smoke. She admits to being less compliant with her offloading shoe. We will continue with same treatment plan and she will follow-up next week. 02/15/18-She is here in follow-up evaluation for left great toe ulcer and abdominal ulcer. The abdominal ulcer is epithelialized. The left great toe ulcer is improved and all injury from last week using the Coban wrap is resolved, the lateral ulcer is healed. She admits to noncompliance with wearing offloading shoe and admits to glucose levels being greater than 300 most of the week. She continues to smoke and expresses no desire to quit. There is one area medially that probes deeper than it has historically, erythema to the toe and dorsal foot has consistently waxed and waned. There is no overt signs of cellulitis or infection but we will culture the wound for any occult infection given the new area of depth and erythema. We will hold off on sensitivities for initiation of antibiotic therapy. 02/22/18-She is here in follow up evaluation for left great toe ulcer. There is overall significant improvement in both wound appearance, erythema and edema with changes made last week. She was not initiated on antibiotic therapy. Culture obtained last week showed oxacillin sensitive staph aureus, sensitive to clindamycin. Clindamycin has been called into the pharmacy but she has been instructed to hold off on initiation secondary to overall clinical improvement and her history of antibiotic intolerance. She has been instructed to contact the clinic with  any noted changes/deterioration and the wound, erythema,  edema and/or pain. She will follow-up next week. She continues to smoke and her glucose levels remain elevated >250; she admits to compliance with offloading shoe 03/01/18 on evaluation today patient appears to be doing fairly well in regard to her left first toe ulcer. She has been tolerating the dressing changes with the [facility] Dressing without complication and overall this has definitely showed signs of improvement according to records as well is what the patient tells me today. I'm very pleased in that regard. She is having no pain today 03/08/18 She is here for follow up evaluation of a left great toe ulcer. She remains non-compliant with glucose control and smoking cessation; glucose levels consistently >200. She states that she got new shoe inserts/peg assist. She admits to compliance with offloading. Since my last evaluation there is significant improvement. We will switch to prisma at this time and she will follow up next week. She is noted to be tachycardic at this appointment, heart rate 120s; she has a history of heart rate 70-130 according to our records. She admits to extreme agitation r/t personal issues; she was advised to monitor her heartrate and contact her physician if it does not return to a more normal range (<100). She takes cardizem twice daily. 03/15/18-She is here in follow-up evaluation for left great toe ulcer. She remains noncompliant with glucose control and smoking cessation. She admits to compliance with wearing offloading shoe. The ulcer is improved/stable and we will continue with the same treatment plan and she will follow-up next week 03/22/18-She is here for evaluation for left great toe ulcer. There continues to be significant improvement despite recurrent hyperglycemia (over 500 yesterday) and she continues to smoke. She has been compliant with offloading and we will continue with same treatment plan and she will follow-up next week. 03/29/18-She is here for  evaluation for left great toe ulcer. Despite continuing to smoke and uncontrolled diabetes she continues to improve. She is compliant with offloading shoe. We will continue with the same treatment plan and she will follow-up next week 04/05/18- She is here in follow up evaluation for a left great toe ulcer; she presents with small pustule to left fifth toe (resembles ant bite). She admits to compliance with wearing offloading shoe; continues to smoke or have uncontrolled blood glucose control. There is more callus than usual with evidence of bleeding; she denies known trauma. 04/12/18-She is here for evaluation of left great toe ulcer. Despite noncompliance with glycemic control and smoking she continues to make Disano, Davin A. ([id number]) improvement. She continues to wear offloading shoe. The pustule, that was identified last week, to the left fifth toe is resolved. She will follow-up in 2 weeks 05/03/18-she is seen in follow-up evaluation for a left great toe ulcer. She is compliant with offloading, otherwise noncompliant with glycemic control and smoking. She has plateaued and there is minimal improvement noted. We will transition to [facility], replaced the insert to her surgical shoe and she will follow-up in one week 05/10/18- She is here in follow up evaluation for a left great toe ulcer. It appears stable despite measurement change. We will continue with same treatment plan and follow up next week. 05/24/18-She is seen in follow-up evaluation for a left great toe ulcer. She remains compliant with offloading, has made significant improvement in her diet, decreasing the amount of sugar/soda. She said her recent A1c was 10.9 which is lower than. She did see a diabetic nutritionist/educator  yesterday. She continues to smoke. We will continue with the same treatment plan and she'll follow-up next week. 05/31/18- She is seen in follow-up evaluation for left great toe ulcer. She continues to remain  compliant with offloading, continues to make improvement in her diet, increasing her water and decreasing the amount of sugar/soda. She does continue to smoke with no desire to quit. We will apply Prisma to the depth and Hydrofera Blue over. We have not received insurance authorization for oasis. She will follow up next week. 06/07/18-She is seen in follow-up evaluation for left great toe ulcer. It has stalled according to today's measurements although base appears stable. She says she saw a diabetic educator yesterday; her average blood sugars are less than 300 which is an improvement for her. She continues to smoke and states "that's my next step" She continues with water over soda. We will order for xray, culture and reinstate ace wrap compression prior to placing apligraf for next week. She is voicing no complaints or concerns. Her dressing will change to iodoflex over the next week in preparation for apligraf. 06/14/18-She is seen in follow-up evaluation for left great toe ulcer. Plain film x-ray performed last week was negative for osteomyelitis. Wound culture obtained last week grew strep B and OSSA; she is initiated on keflex and cefdinir today; there is erythema to the toe which could be from ace wrap compression, she has a history of wrapping too tight and has has been encouraged to maintain ace wraps that we place today. We will hold off on application of apligraf today, will apply next week after antibiotic therapy has been initiated. She admits today that she has resumed taking a shower with her foot/toe submerged in water, she has been reminded to keep foot/toe out of the bath water. She will be seen in follow up next week 06/21/18-she is seen in follow-up evaluation for left great toe ulcer. She is tolerating antibiotic therapy with no GI disturbance. The wound is stable. Apligraf was applied today. She has been decreasing her smoking, only had 4 cigarettes yesterday and 1 today. She  continues being more compliant in diabetic diet. She will follow-up next week for evaluation of site, if stable will remove at 2 weeks. 06/28/18- She is here in follow up evalution. Apligraf was placed last week, she states the dressing fell off on Tuesday and she was dressing with hydrofera blue. She is healed and will be discharged from the clinic today. She has been instructed to continue with smoking cessation, continue monitoring glucose levels, offloading for an additional 4 weeks and continue with hydrofera blue for additional two weeks for any possible microscopic opening. Readmission: 08/07/18 on evaluation today patient presents for reevaluation concerning the ulcer of her right great toe. She was previously discharged on 06/28/18 healed. Nonetheless she states that this began to show signs of drainage she subsequently went to her primary care provider. Subsequently an x-ray was performed on 08/01/18 which was negative. The patient was also placed on antibiotics at that time. Fortunately they should have been effective for the infection. Nonetheless she's been experiencing some improvement but still has a lot of drainage coming from the wound itself. 08/14/18 on evaluation today patient's wound actually does show signs of improvement in regard to the erythema at this point. She has completed the antibiotics. With that being said we did discuss the possibility of placing her in a total contact cast as of today although I think that I may want to give  this just a little bit more time to ensure nothing recurrence as far as her infection is concerned. I do not want to put in the cast and risk infection at that time if things are not completely resolved. With that being said she is gonna require some debridement today. 08/21/18 on evaluation today patient actually appears to be doing okay in regard to her toe ulcer. She's been tolerating the dressing changes without complication. With that being  said it does appear that she is ready and in fact I think it's appropriate for [place] to go ahead and initiate the total contact cast today. Nonetheless she will require some sharp debridement to prepare the wound for application. Overall I feel like things have been progressing well but we do need to do something to get this to close more readily. 08/24/18 patient seen today for reevaluation after having had the total contact cast applied on Tuesday. She seems to have done very well the wound appears to be doing great and overall I'm pleased with the progress that she's made. There were no abnormal areas of rubbing from the cast on her lower extremity. 08/30/18 on evaluation today patient actually appears to be completely healed in regard to her plantar toe ulcer. She tells me at this point she's been having a lot of issues with the cast. She almost fell a couple of times the state shall the step of her dog a couple times as well. This is been a very frustrating process for her other nonetheless she has completely healed the wound which is excellent news. Overall there does not appear to be the evidence of infection at this time which is great news. 09/11/18 evaluation today patient presents for follow-up concerning her great toe ulcer on the left which has unfortunately reopened since I last saw her which was only a couple of weeks ago. Unfortunately she was not able to get in to get the shoe and potentially the AFO that's gonna be necessary due to her left foot drop. She continues with offloading shoe but this is not enough to prevent her from reopening it appears. When we last had her in the total contact cast she did well from a healing standpoint but unfortunately the wound reopened as soon as she came out of the cast within just a couple of weeks. 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 This is also part of the reason why she falls on a regular basis. I just do not believe that is gonna be safe for [place] to reinitiate the total contact cast as last time we had this on she fell 3 times one week which is definitely not normal for her. 09/18/18 upon evaluation today the patient actually appears to be doing about the same in regard to her toe ulcer. She did not contact [employer] as I asked her to even though I had given her the prescription. In fact she actually states that she has no idea where the prescription is. She did apparently call [employer] and they told her that all she needed to do was bring the prescription in order to be able to be seen and work on getting the AFO for her left foot. With all that being said she still does not have an  appointment and I'm not sure were things stand that regard. I will give her a new prescription today in order to contact them to get this set up. 09/25/18 on evaluation today patient actually appears to be doing about the same in regard to her toes ulcer. She does have a small areas which seems to have a lot of callous buildup around the edge of the wound which is going to need sharp debridement today. She still is waiting to be scheduled for evaluation with [employer] for possibility of an AFO. She states there supposed to call her tomorrow to get this set up. Unfortunately it does appear that her foot specifically the toe area is showing signs of erythema. There does not appear to be any systemic infection which is in these good news. ISHITHA, HANNASCH A. ([id number]) 10/02/18 on evaluation today patient actually appears to be doing about the same in regard to her toe ulcer. This really has not done too well although it's not significantly larger it's also not significantly smaller. She has been tolerating the dressing changes without complication. She actually has her  appointment with [employer] and [facility] tomorrow to hopefully be measured for obtaining and AFO splint. I think this would be helpful preventing this from reoccurring. We had contemplated starting the cast this week although to be honest I am reluctant to do that as she's been having nausea, vomiting, and seizure activity over the past three days. She has a history of seizures and have been told is nothing that can be done for these. With that being said I do believe that along with the seizures have the nausea vomiting which upon further questioning doesn't seem to be the normal for her and makes me concerned for the possibility of infection or something else going on. I discussed this with the patient and her mother during the office visit today. I do not feel the wound is effective but maybe something else. The responses this was "this just happens to her at times and we don't know why". They did not seem to be interested in going to the hospital to have this checked out further. 10/09/18 on evaluation today patient presents for follow-up concerning her ongoing toe ulcer. She has been tolerating the dressing changes without complication. Fortunately there does not appear to be any evidence of infection which is great news however I do think that the patient would benefit from going ahead for with the total contact cast. She's actually in a wheelchair today she tells me that she will use her walker if we initiate the cast. I was very specific about the fact that if we were gonna do the cast I wanted to make sure that she was using the walker in order to prevent any falls. She tells me she does not have stairs that she has to traverse on a regular basis at her home. She has not had any seizures since last week again that something that happens to her often she tells me she did talk to [employer] and they said that it may take up to three weeks to get the brace approved for her. Hopefully that will not take  that long but nonetheless in the meantime I do think the cast could be of benefit. 10/12/18 on evaluation today patient appears to be doing rather well in regard to her toe ulcer. It's just been a few days and already this is significantly improved both as far as overall appearance and size. Fortunately there's no sign  of infection. She is here for her first obligatory cast change. 10/19/18 Seen today for follow up and management of left great toe ulcer. Wound continues to show improvement. Noted small open area with seroussang drainage with palpation. Denies any increased pain or recent fevers during visit. She will continue calcium alginate with offloading shoe. Denies any questions or concerns during visit. 10/26/18 on evaluation today patient appears to be doing about the same as when I last saw her in regard to her wound bed. Fortunately there does not appear to be any signs of infection. Unfortunately she continues to have a breakdown in regard to the toe region any time that she is not in the cast. It takes almost no time at all for this to happen. Nonetheless she still has not heard anything from the brace being made by [employer] as to when exactly this will be available to her. Fortunately there is no signs of infection at this time. 10/30/18 on evaluation today patient presents for application of the total contact cast as we just received him this morning. Fortunately we are gonna be able to apply this to her today which is great news. She continues to have no significant pain which is good news. Overall I do feel like things have been improving while she was the cast is when she doesn't have a cast that things get worse. She still has not really heard anything from [facility] regarding her brace. 11/02/18 upon evaluation today patient's wound already appears to be doing significantly better which is good news. Fortunately there does not appear to be any signs of infection also good news. Overall I  do think the total contact cast as before is helping to heal this area unfortunately it's just not gonna likely keep the area closed and healed without her getting her brace at least. Again the foot drop is a significant issue for her. 11/09/18 on evaluation today patient appears to be doing excellent in regard to her toe ulcer which in fact is completely healed. Fortunately we finally got the situation squared away with the paperwork which was needed to proceed with getting her brace approved by Medicaid. I have filled that out unfortunately that information has been sent to the orthopedic office that I worked at 2 1/2 years ago and not tired Current wound care measures. Fortunately she seems to be doing very well at this time. 11/23/18 on evaluation today patient appears to be doing More Poorly Compared to Last Time I Saw Her. At [facility] She Had Completely Healed. Currently she is continuing to have issues with reopening. She states that she just found out that the brace was approved through Medicaid now she just has to go get measured in order to have this fitted for her and then made. Subsequently she does not have an appointment for this yet that is going to complicate things we obviously cannot put her back in the cast if we do not have everything measured because they're not gonna be able to measure her foot while she is in the cast. 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 Obviously this is unfortunate and does have me somewhat worried as well. 11/30/18 on evaluation today patient's toe ulcer actually appears to be doing fairly well. The good news is she will be getting her brace in the shoes next week on Wednesday. Hopefully we will be  able to get this to heal without having to go back in the cast however she may need the cast in order to get the wound completely heal and then go from there.  Fortunately there's no signs of infection at this time. 12/07/18 on evaluation today patient fortunately did receive her brace and she states she could tell this definitely makes her walk better. With that being said she's been having issues with her toe where she noticed yesterday there was a lot of tissue that was loosing off this appears to be much larger than what it was previous. She also states that her leg has been read putting much across the top of her foot just about the ankle although this seems to be receiving somewhat. The total area is still red and appears to be someone infected as best I can tell. She is previously taken Bactrim and that may be a good option for her today as well. We are gonna see what I wound culture shows as well and I think that this is definitely appropriate. With that being said outside of the culture I still need to initiate something in the interim and that's what I'm gonna go ahead and select Bactrim is a good option for her. 12/14/18 on evaluation today patient appears to be doing better in regard to her left great toe ulcer as compared to last week's evaluation. There's still some erythema although this is significantly improved which is excellent news. Overall I do believe that she is making good progress is still gonna take some time before she is where I would like her to be from the standpoint of being able to place her back into the total contact cast. Hopefully we will be where we need to be by next week. 12/21/18 on evaluation today patient actually appears to be doing poorly in regard to her toe ulcer. She's been tolerating the dressing changes without complication. Fortunately there's no signs of systemic infection although she does have a lot of drainage from the toe ulcer and this does seem to be causing some issues at this point. She does have erythema on the distal portion of her toe that appears to be likely cellulitis. 12/28/18 on evaluation today  patient actually appears to be doing a little better in my pinion in regard to her toe ulcer. With that being said she still does have some evidence of infection at this time and for her culture she had both E. coli as well as enterococcus as organisms noted on evaluation. For that reason I think that though the Keflex likely has treated the E. coli rather well this has really done nothing for the enterococcus. We are going to have to initiate treatment for this specifically. [name], [name] ([id number]) 01/04/19 on evaluation today patient's toe actually appears to be doing better from the standpoint of infection. She currently would like to see about putting the cash back on I think that this is appropriate as long as she takes care of it and keeps it from getting wet. She is gonna have some drainage we can definitely pass this up with Drawtex and alginate to try to prevent as much drainage as possible from causing the problems. With that being said I do want to at least try her with the cast between now and Tuesday. If there any issues we can't continue to use it then I will discontinue the use of the cast at that point. 01/08/19 on evaluation today patient actually appears  to be doing very well as far as her foot ulcer specifically the great toe on the left is concerned. She did have an area of rubbing on the medial aspect of her left ankle which again is from the cast. Fortunately there's no signs of infection at this point in this appears to be a very slight skin breakdown. The patient tells me she felt it rubbing but didn't think it was that bad. Fortunately there is no signs of active infection at this time which is good news. No fevers, chills, nausea, or vomiting noted at this time. 01/15/19 on evaluation today patient actually appears to be doing well in regard to her toe ulcer. Again as previous she seems to do well and she has the cast on which indicates to me that during the time she  doesn't have a cast on she's putting way too much pressure on this region. Obviously I think that's gonna be an issue as with the current national emergency concerning the Covid-19 Virus it has been recommended that we discontinue the use of total contact casting by the chief medical officer of our company, Dr. Simona Huh. The reasoning is that if a patient becomes sick and cannot come into have the cast removed they could not just leave this on for an additional two weeks. Obviously the hospitals also do not want to receive patient's who are sick into the emergency department to potentially contaminate the region and spread the Covid-19 Virus among other sick individuals within the hospital system. Therefore at this point we are suspending the use of total contact cast until the current emergency subsides. This was all discussed with the patient today as well. 01/22/19 on evaluation today patient's wound on her left great toe appears to be doing slightly worse than previously noted last week. She tells me that she has been on this quite a bit in fact she tells me she's been awake for 38 straight hours. This is due to the fact that she's having to care for grandparents because nobody else will. She has been taking care of them for five the last seven days since I've seen her they both have dementia his is from a stroke and her grandmother's was progressive. Nonetheless she states even her mom who knows her condition and situation has only help two of those days to take care of them she's been taking care of the rest. Fortunately there does not appear to be any signs of active infection in regard to her toe at this point although obviously it doesn't look as good as it did previous. I think this is directly related to her not taking off the pressure and friction by way of taking things easy. Though I completely understand what's going on. 01/29/19 on evaluation today patient's tools are actually appears to be  showing some signs of improvement today compared to last week's evaluation as far as not necessarily the overall size of the wound but the fact that she has some new skin growth in between the two ends of the wound opening. Overall I feel like she has done well she states that she had a family member give her what sounds to be a CAM walker boot which has been helpful as well. 02/05/19 on evaluation today patient's wound bed actually appears to be doing significantly better in regard to her overall appearance of the size of the wound. With that being said she is still having an issue with offloading efficiently enough to get this  to close. Apparently there is some signs of infection at this point as well unfortunately. Previously she's done well of Augmentin I really do not see anything that needs to be culture currently but there theme and cellulitis of the foot that I'm seeing I'm gonna go ahead and place her on an antibiotic today to try to help clear this up. 02/12/2019 on evaluation today patient actually appears to be doing poorly in regard to her overall wound status. She tells me she has been using her offloading shoe but actually comes in today wearing her tennis shoe with the AFO brace. Again as I previously discussed with her this is really not sufficient to allow the area to heal appropriately. Nonetheless she continues to be somewhat noncompliant and I do wonder based on what she has told my nurse in the past as to whether or not a good portion of this noncompliance may be recreational drug and alcohol related. She has had a history of heroin overdose and this was fairly recently in the past couple of months that have been seeing her. Nonetheless overall I feel like her wound looks significantly worse today compared to what it was previous. She still has significant erythema despite the Augmentin I am not sure that this is an appropriate medication for her infection I am also concerned that  the infection may have gone down into her bone. 02/19/19 on evaluation today patient actually appears to be doing about the same in regard to her toe ulcer. Unfortunately she continues to show signs of bone exposure and infection at this point. There does not appear to be any evidence of worsening of the infection but I'm also not really sure that it's getting significantly better. She is on the Augmentin which should be sufficient for the Staphylococcus aureus infection that she has at this point. With that being said she may need IV antibiotics to more appropriately treat this. We did have a discussion today about hyperbaric option therapy. 02/28/19 on evaluation today patient actually appears to be doing much worse in regard to the wound on her left great toe as compared to even my previous evaluation last week. Unfortunately this seems to be training in a pretty poor direction. Her toe was actually now starting to angle laterally and I can actually see the entire joint area of the proximal portion of the digit where is the distal portion of the digit again is no longer even in contact with the joint line. Unfortunately there's a lot more necrotic tissue around the edge and the toe appears to be showing signs of becoming gangrenous in my pinion. I'm very concerned about were things stand at this point. She did see infectious disease and they are planning to send in a prescription for Sivextro for her and apparently this has been approved. With that being said I don't think she should avoid taking this but at the same time I'm not sure that it's gonna be sufficient to save her toe at this point. 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 03/05/19 on evaluation today patient actually appears to be  doing quite well in regard to her toe all things considering. She still has bone exposed but there appears to be much less your thing on overall the  appearance of the wound and the toe itself is dramatically improved. She still does have some issues currently obviously with infection she did see vascular as well and there concerned that her blood flow to the toad. For that reason they are setting up for an angiogram next week. 03/14/19 on evaluation today patient appears to be doing very poor in regard to her toe and specifically in regard to the ulceration and the fact that she's starting to notice the toe was leaning even more towards the lateral aspect and the complete joint is visible on the proximal aspect of the joint. Nonetheless she's also noted a significant odor and the tip of the toe is turning more dark and necrotic appearing. Overall I think she is getting worse not better as far as this is concerned. For that reason I am recommending at this point that she likely needs to be seen for likely amputation. READMISSION 03/19/2021 This is a patient that we cared for in this clinic for a prolonged period of time in 2019 and 2020 with a left foot and left first toe wound. 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 ([id number]) 04/09/20 by Dr. Luana Shu. In December 2021 she had an ulcer on her right great toe as well as the fourth and fifth toes. 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 She also had an angiogram at that time and underwent angioplasty of the right anterior tibial artery. In any case she claims that the wound on the right foot is closed I did not look at this today which was probably an oversight although I think that should be done next week. After her surgery she developed a dehiscence but I do not see any follow-up of this.  According to Dr. Deborra Medina last review that she was out of the area being cared for by another physician but recently came back to his attention. 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 A culture of this area showed E. coli apparently before she came back to see Dr. Luana Shu she was supposed to be receiving antibiotics but she did not really take them. Nor is she offloading this area at all. Finally her last hemoglobin A1c listed in epic was in March 2022 at 14.1 she says things are a lot better since then although I am not sure. 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 She had this wound at the time. During this admission she had E. coli sepsis a TEE was negative. 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 She is still a heavy cigarette smoker. She has a history of bipolar disorder seizure disorder ABI in our clinic was 1.05 6/1; left midfoot in the setting of a TMA done previously. Round circular wound with a "knuckle" of protruding tissue. The problem is that the knuckle was not attached to any of the surrounding granulation and this probed proximally widely I removed a large portion of this tissue. This wound goes with considerable undermining laterally. I do not feel any bone there was no purulence but this is a deep wound.  6/8; in spite of the debridement I did last week. She arrives with a wound looking exactly the same. A protruding "knuckle" of tissue nonadherent to most of the surrounding tissue. There is considerable depth around this from 6-12 o'clock at 2.7 cm and undermining of 1 cm. This does not look overtly infected and the x-ray I  did last week was negative for any osseous abnormalities. We have been using silver collagen Electronic Signature(s) Signed: 03/31/2021 4:14:58 PM By: Linton Ham MD Entered By: Linton Ham on 03/31/2021 13:39:52 [name] ([id number]) -------------------------------------------------------------------------------- Physical Exam Details Patient Name: Heather Dear A. Date of Service: 03/31/2021 12:45 PM Medical Record Number: [id number] Patient Account Number: [id number] Date of Birth/Sex: 1976/10/13 (45 y.o. F) Treating RN: Cornell Barman Primary Care Provider: Raelene Bott Other Clinician: Referring Provider: Raelene Bott Treating Provider/Extender: Tito Dine in Treatment: 1 Constitutional Sitting or standing Blood Pressure is within target range for patient.. Pulse regular and within target range for patient.Marland Kitchen Respirations regular, non- labored and within target range.. Temperature is normal and within the target range for the patient.Marland Kitchen appears in no distress. Notes Wound exam; surprisingly the tissue I removed last week was replaced in the course of 1 week by something that looked exactly the same. Once again using a 15 scalpel and pickups I remove this but this time sent specimens for pathology and culture. The base of this goes to the tendon but it does not go to bone. There is considerable undermining from 6-12 o'clock of this deep punched out wound. Electronic Signature(s) Signed: 03/31/2021 4:14:58 PM By: Linton Ham MD Entered By: Linton Ham on 03/31/2021 13:37:21 [name] ([id number]) -------------------------------------------------------------------------------- Physician Orders Details Patient Name: Heather Dear A. Date of Service: 03/31/2021 12:45 PM Medical Record Number: [id number] Patient Account Number: [id number] Date of Birth/Sex: [birth year] (45 y.o. F) Treating RN: Cornell Barman Primary Care Provider: Raelene Bott Other  Clinician: Referring Provider: Raelene Bott Treating Provider/Extender: Tito Dine in Treatment: 1 Verbal / Phone Orders: No Diagnosis Coding Follow-up Appointments o Return Appointment in 1 week. Bathing/ Shower/ Hygiene o Clean wound with Normal Saline or wound cleanser. Off-Loading Left Lower Extremity o Open toe surgical shoe with peg assist. Wound Treatment Wound #7 - Foot Wound Laterality: Plantar, Left, Midline Cleanser: Soap and Water 3 x Per Week/30 Days Discharge Instructions: Gently cleanse wound with antibacterial soap, rinse and pat dry prior to dressing wounds Primary Dressing: Prisma 4.34 (in) 3 x Per Week/30 Days Discharge Instructions: Moisten with hydrogel-tuck in undermining area Secondary Dressing: Bordered Gauze Sterile-HBD 4x4 (in/in) 3 x Per Week/30 Days Discharge Instructions: Cover wound with Bordered Guaze Sterile as directed Laboratory o Bacteria identified in Wound by Culture (MICRO) - Left plantar foot oooo LOINC Code: [id number] oooo Convenience Name: Wound culture routine o Bacteria identified in Tissue by Biopsy culture (MICRO) - Left Plantar Foot oooo LOINC Code: [id number] oooo Convenience Name: Biopsy specimen culture Radiology o Computed Tomography (CT) Scan - Left Foot Without contrast - non-healing wound on plantar foot Electronic Signature(s) Signed: 03/31/2021 4:14:58 PM By: Linton Ham MD Signed: 03/31/2021 4:36:28 PM By: Gretta Cool, BSN, RN, CWS, [name] RN, BSN Entered By: Gretta Cool, BSN, RN, CWS, [name] on 03/31/2021 13:29:23 [name], [name] ([id number]) -------------------------------------------------------------------------------- Prescription 03/31/2021 Patient Name: Heather Dear A. Provider: Ricard [name] MD Date of Birth: 06-Sep-1976 NPI#: [id number] Sex: F DEA#: [id number] Phone #: [phone] License #: [id number] Patient Address: [place] [facility] [place], [place] [address] [address] [place], [place] [address] [phone] Allergies erythromycin base Provider's Orders o Computed Tomography (CT) Scan - Left Foot Without contrast - non-healing wound on plantar foot Hand Signature: Date(s): Electronic Signature(s) Signed: 03/31/2021 4:14:58 PM By: Linton Ham MD Signed: 03/31/2021 4:36:28 PM By: Gretta Cool, BSN, RN, CWS, [name] RN, BSN Entered By: Gretta Cool, BSN, RN, CWS, [name] on 03/31/2021 13:29:23 [name] ([id number]) --------------------------------------------------------------------------------  Problem List Details Patient Name: Heather Dear A. Date of Service: 03/31/2021 12:45 PM Medical Record Number: [id number] Patient Account Number: [id number] Date of Birth/Sex: April 30, 1976 (45 y.o. F) Treating RN: Cornell Barman Primary Care Provider: Raelene Bott Other Clinician: Referring Provider: Raelene Bott Treating Provider/Extender: Tito Dine in Treatment: 1 Active Problems ICD-10 Encounter Code Description Active Date MDM Diagnosis E11.621 Type 2 diabetes mellitus with foot ulcer 03/19/2021 No Yes 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 03/19/2021 No Yes severity E11.42 Type 2 diabetes mellitus with diabetic polyneuropathy 03/19/2021 No Yes Inactive Problems Resolved Problems Electronic Signature(s) Signed: 03/31/2021 4:14:58 PM By: Linton Ham MD Entered By: Linton Ham on 03/31/2021 13:32:15 [name], [name] ([id number]) -------------------------------------------------------------------------------- Progress Note Details Patient Name: Heather Dear A. Date of Service: 03/31/2021 12:45 PM Medical Record Number: [id number] Patient Account Number: [id number] Date of Birth/Sex: 02-27-1976 (45 y.o. F) Treating RN: Cornell Barman Primary Care Provider: Raelene Bott Other Clinician: Referring Provider: Raelene Bott Treating  Provider/Extender: Tito Dine in Treatment: 1 Subjective History of Present Illness (HPI) 01/18/18-She is here for initial evaluation of the left great toe ulcer. She is a poor historian in regards to timeframe in detail. She states approximately 4 weeks ago she lacerated her toe on something in the house. She followed up with her primary care who placed her on Bactrim and ultimately a second dose of Bactrim prior to coming to wound clinic. She states she has been treating the toe with peroxide, Betadine and a Band-Aid. She did not check her blood sugar this morning but checked it yesterday morning it was 327; she is unaware of a recent A1c and there are no current records. She saw Dr. she would've orthopedics last week for an old injury to the left ankle, she states he did not see her toe, nor did she bring it to his attention. She smokes approximately 1 pack cigarettes a day. Her social situation is concerning, she arrives this morning with her mother who appears extremely intoxicated/under the influence; her mother was asked to leave the room and be monitored by the patient's grandmother. The patient's aunt then accompanied the patient and the room throughout the rest of the appointment. We had a lengthy discussion regarding the deleterious effects of uncontrolled hyperglycemia and smoking as it relates to wound healing and overall health. She was strongly encouraged to decrease her smoking and get her diabetes under better control. She states she is currently on a diet and has cut down her [facility] consumption. The left toe is erythematous, macerated and slightly edematous with malodor present. The edema in her left foot is below her baseline, there is no erythema streaking. We will treat her with Santyl, doxycycline; we have ordered and xray, culture and provided a Peg assist surgical shoe and cultured the wound. 01/25/18-She is here in follow-up evaluation for a left great toe  ulcer and presents with an abscess to her suprapubic area. She states her blood sugars remain elevated, feeling "sick" and if levels are  below 250, but she is trying. She has made no attempt to decrease her smoking stating that we "can't take away her food in her cigarettes". She has been compliant with offloading using the PEG assist you. She is using Santyl daily. the culture obtained last week grew staph aureus and Enterococcus faecalis; continues on the doxycycline and Augmentin was added on Monday. The suprapubic area has erythema, no femoral variation, purple discoloration, minimal induration, was accessed with a cotton tip applicator with sanguinopurulent drainage, this was cultured, I suspect the current antibiotic treatment will cover and we will not add anything to her current treatment plan. She was advised to go to urgent care or ER with any change in redness, induration or fever. 02/01/18-She is here in follow-up evaluation for left great toe ulcers and a new abdominal abscess from last week. She was able to use packing until earlier this week, where she "forgot it was there". She states she was feeling ill with GI symptoms last week and was not taking her antibiotic. She states her glucose levels have been predominantly less than 200, with occasional levels between 200-250. She thinks this was contributing to her GI symptoms as they have resolved without intervention. There continues to be significant laceration to left toe, otherwise it clinically looks stable/improved. There is now less superficial opening to the lateral aspect of the great toe that was residual blister. We will transition to [facility] to all wounds, she will continue her Augmentin. If there is no change or deterioration next week for reculture. 02/08/18-She is here in follow-up evaluation for left great toe ulcer and abdominal ulcer. There is an improvement in both wounds. She has been wrapping her left toe with  coban, not by our direction, which has created an area of discoloration to the medial aspect; she has been advised to NOT use coban secondary to her neuropathy. She states her glucose levels have been high over this last week ranging from 200-350, she continues to smoke. She admits to being less compliant with her offloading shoe. We will continue with same treatment plan and she will follow-up next week. 02/15/18-She is here in follow-up evaluation for left great toe ulcer and abdominal ulcer. The abdominal ulcer is epithelialized. The left great toe ulcer is improved and all injury from last week using the Coban wrap is resolved, the lateral ulcer is healed. She admits to noncompliance with wearing offloading shoe and admits to glucose levels being greater than 300 most of the week. She continues to smoke and expresses no desire to quit. There is one area medially that probes deeper than it has historically, erythema to the toe and dorsal foot has consistently waxed and waned. There is no overt signs of cellulitis or infection but we will culture the wound for any occult infection given the new area of depth and erythema. We will hold off on sensitivities for initiation of antibiotic therapy. 02/22/18-She is here in follow up evaluation for left great toe ulcer. There is overall significant improvement in both wound appearance, erythema and edema with changes made last week. She was not initiated on antibiotic therapy. Culture obtained last week showed oxacillin sensitive staph aureus, sensitive to clindamycin. Clindamycin has been called into the pharmacy but she has been instructed to hold off on initiation secondary to overall clinical improvement and her history of antibiotic intolerance. She has been instructed to contact the clinic with any noted changes/deterioration and the wound, erythema, edema and/or pain. She will follow-up next  week. She continues to smoke and her glucose levels remain  elevated >250; she admits to compliance with offloading shoe 03/01/18 on evaluation today patient appears to be doing fairly well in regard to her left first toe ulcer. She has been tolerating the dressing changes with the [facility] Dressing without complication and overall this has definitely showed signs of improvement according to records as well is what the patient tells me today. I'm very pleased in that regard. She is having no pain today 03/08/18 She is here for follow up evaluation of a left great toe ulcer. She remains non-compliant with glucose control and smoking cessation; glucose levels consistently >200. She states that she got new shoe inserts/peg assist. She admits to compliance with offloading. Since my last evaluation there is significant improvement. We will switch to prisma at this time and she will follow up next week. She is noted to be tachycardic at this appointment, heart rate 120s; she has a history of heart rate 70-130 according to our records. She admits to extreme agitation r/t personal issues; she was advised to monitor her heartrate and contact her physician if it does not return to a more normal range (<100). She takes cardizem twice daily. 03/15/18-She is here in follow-up evaluation for left great toe ulcer. She remains noncompliant with glucose control and smoking cessation. She admits to compliance with wearing offloading shoe. The ulcer is improved/stable and we will continue with the same treatment plan and she will follow-up next week 03/22/18-She is here for evaluation for left great toe ulcer. There continues to be significant improvement despite recurrent hyperglycemia (over 500 yesterday) and she continues to smoke. She has been compliant with offloading and we will continue with same treatment plan and she will follow-up next week. 03/29/18-She is here for evaluation for left great toe ulcer. Despite continuing to smoke and uncontrolled diabetes she continues  to improve. She is compliant with offloading shoe. We will continue with the same treatment plan and she will follow-up next week 04/05/18- She is here in follow up evaluation for a left great toe ulcer; she presents with small pustule to left fifth toe (resembles ant bite). She admits to compliance with wearing offloading shoe; continues to smoke or have uncontrolled blood glucose control. There is more callus than usual with evidence of bleeding; she denies known trauma. 04/12/18-She is here for evaluation of left great toe ulcer. Despite noncompliance with glycemic control and smoking she continues to make [place], Dacoda A. ([id number]) improvement. She continues to wear offloading shoe. The pustule, that was identified last week, to the left fifth toe is resolved. She will follow-up in 2 weeks 05/03/18-she is seen in follow-up evaluation for a left great toe ulcer. She is compliant with offloading, otherwise noncompliant with glycemic control and smoking. She has plateaued and there is minimal improvement noted. We will transition to [facility], replaced the insert to her surgical shoe and she will follow-up in one week 05/10/18- She is here in follow up evaluation for a left great toe ulcer. It appears stable despite measurement change. We will continue with same treatment plan and follow up next week. 05/24/18-She is seen in follow-up evaluation for a left great toe ulcer. She remains compliant with offloading, has made significant improvement in her diet, decreasing the amount of sugar/soda. She said her recent A1c was 10.9 which is lower than. She did see a diabetic nutritionist/educator yesterday. She continues to smoke. We will continue with the same treatment plan and  she'll follow-up next week. 05/31/18- She is seen in follow-up evaluation for left great toe ulcer. She continues to remain compliant with offloading, continues to make improvement in her diet, increasing her water and  decreasing the amount of sugar/soda. She does continue to smoke with no desire to quit. We will apply Prisma to the depth and Hydrofera Blue over. We have not received insurance authorization for oasis. She will follow up next week. 06/07/18-She is seen in follow-up evaluation for left great toe ulcer. It has stalled according to today's measurements although base appears stable. She says she saw a diabetic educator yesterday; her average blood sugars are less than 300 which is an improvement for her. She continues to smoke and states "that's my next step" She continues with water over soda. We will order for xray, culture and reinstate ace wrap compression prior to placing apligraf for next week. She is voicing no complaints or concerns. Her dressing will change to iodoflex over the next week in preparation for apligraf. 06/14/18-She is seen in follow-up evaluation for left great toe ulcer. Plain film x-ray performed last week was negative for osteomyelitis. Wound culture obtained last week grew strep B and OSSA; she is initiated on keflex and cefdinir today; there is erythema to the toe which could be from ace wrap compression, she has a history of wrapping too tight and has has been encouraged to maintain ace wraps that we place today. We will hold off on application of apligraf today, will apply next week after antibiotic therapy has been initiated. She admits today that she has resumed taking a shower with her foot/toe submerged in water, she has been reminded to keep foot/toe out of the bath water. She will be seen in follow up next week 06/21/18-she is seen in follow-up evaluation for left great toe ulcer. She is tolerating antibiotic therapy with no GI disturbance. The wound is stable. Apligraf was applied today. She has been decreasing her smoking, only had 4 cigarettes yesterday and 1 today. She continues being more compliant in diabetic diet. She will follow-up next week for evaluation of  site, if stable will remove at 2 weeks. 06/28/18- She is here in follow up evalution. Apligraf was placed last week, she states the dressing fell off on Tuesday and she was dressing with hydrofera blue. She is healed and will be discharged from the clinic today. She has been instructed to continue with smoking cessation, continue monitoring glucose levels, offloading for an additional 4 weeks and continue with hydrofera blue for additional two weeks for any possible microscopic opening. Readmission: 08/07/18 on evaluation today patient presents for reevaluation concerning the ulcer of her right great toe. She was previously discharged on 06/28/18 healed. Nonetheless she states that this began to show signs of drainage she subsequently went to her primary care provider. Subsequently an x-ray was performed on 08/01/18 which was negative. The patient was also placed on antibiotics at that time. Fortunately they should have been effective for the infection. Nonetheless she's been experiencing some improvement but still has a lot of drainage coming from the wound itself. 08/14/18 on evaluation today patient's wound actually does show signs of improvement in regard to the erythema at this point. She has completed the antibiotics. With that being said we did discuss the possibility of placing her in a total contact cast as of today although I think that I may want to give this just a little bit more time to ensure nothing recurrence as far as  her infection is concerned. I do not want to put in the cast and risk infection at that time if things are not completely resolved. With that being said she is gonna require some debridement today. 08/21/18 on evaluation today patient actually appears to be doing okay in regard to her toe ulcer. She's been tolerating the dressing changes without complication. With that being said it does appear that she is ready and in fact I think it's appropriate for [place] to go ahead and  initiate the total contact cast today. Nonetheless she will require some sharp debridement to prepare the wound for application. Overall I feel like things have been progressing well but we do need to do something to get this to close more readily. 08/24/18 patient seen today for reevaluation after having had the total contact cast applied on Tuesday. She seems to have done very well the wound appears to be doing great and overall I'm pleased with the progress that she's made. There were no abnormal areas of rubbing from the cast on her lower extremity. 08/30/18 on evaluation today patient actually appears to be completely healed in regard to her plantar toe ulcer. She tells me at this point she's been having a lot of issues with the cast. She almost fell a couple of times the state shall the step of her dog a couple times as well. This is been a very frustrating process for her other nonetheless she has completely healed the wound which is excellent news. Overall there does not appear to be the evidence of infection at this time which is great news. 09/11/18 evaluation today patient presents for follow-up concerning her great toe ulcer on the left which has unfortunately reopened since I last saw her which was only a couple of weeks ago. Unfortunately she was not able to get in to get the shoe and potentially the AFO that's gonna be necessary due to her left foot drop. She continues with offloading shoe but this is not enough to prevent her from reopening it appears. When we last had her in the total contact cast she did well from a healing standpoint but unfortunately the wound reopened as soon as she came out of the cast within just a couple of weeks. 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 This is  also part of the reason why she falls on a regular basis. I just do not believe that is gonna be safe for [place] to reinitiate the total contact cast as last time we had this on she fell 3 times one week which is definitely not normal for her. 09/18/18 upon evaluation today the patient actually appears to be doing about the same in regard to her toe ulcer. She did not contact [employer] as I asked her to even though I had given her the prescription. In fact she actually states that she has no idea where the prescription is. She did apparently call [employer] and they told her that all she needed to do was bring the prescription in order to be able to be seen and work on getting the AFO for her left foot. With all that being said she still does not have an appointment and I'm not sure were things stand that regard. I will give her  a new prescription today in order to contact them to get this set up. 09/25/18 on evaluation today patient actually appears to be doing about the same in regard to her toes ulcer. She does have a small areas which seems to have a lot of callous buildup around the edge of the wound which is going to need sharp debridement today. She still is waiting to be scheduled for evaluation with [employer] for possibility of an AFO. She states there supposed to call her tomorrow to get this set up. Unfortunately it does appear that her foot specifically the toe area is showing signs of erythema. There does not appear to be any systemic infection which is in these good news. [name], [name] ([id number]) 10/02/18 on evaluation today patient actually appears to be doing about the same in regard to her toe ulcer. This really has not done too well although it's not significantly larger it's also not significantly smaller. She has been tolerating the dressing changes without complication. She actually has her appointment with [facility] and [place] tomorrow to hopefully be measured for obtaining and AFO  splint. I think this would be helpful preventing this from reoccurring. We had contemplated starting the cast this week although to be honest I am reluctant to do that as she's been having nausea, vomiting, and seizure activity over the past three days. She has a history of seizures and have been told is nothing that can be done for these. With that being said I do believe that along with the seizures have the nausea vomiting which upon further questioning doesn't seem to be the normal for her and makes me concerned for the possibility of infection or something else going on. I discussed this with the patient and her mother during the office visit today. I do not feel the wound is effective but maybe something else. The responses this was "this just happens to her at times and we don't know why". They did not seem to be interested in going to the hospital to have this checked out further. 10/09/18 on evaluation today patient presents for follow-up concerning her ongoing toe ulcer. She has been tolerating the dressing changes without complication. Fortunately there does not appear to be any evidence of infection which is great news however I do think that the patient would benefit from going ahead for with the total contact cast. She's actually in a wheelchair today she tells me that she will use her walker if we initiate the cast. I was very specific about the fact that if we were gonna do the cast I wanted to make sure that she was using the walker in order to prevent any falls. She tells me she does not have stairs that she has to traverse on a regular basis at her home. She has not had any seizures since last week again that something that happens to her often she tells me she did talk to [employer] and they said that it may take up to three weeks to get the brace approved for her. Hopefully that will not take that long but nonetheless in the meantime I do think the cast could be of benefit. 10/12/18  on evaluation today patient appears to be doing rather well in regard to her toe ulcer. It's just been a few days and already this is significantly improved both as far as overall appearance and size. Fortunately there's no sign of infection. She is here for her first obligatory cast change. 10/19/18 Seen today  for follow up and management of left great toe ulcer. Wound continues to show improvement. Noted small open area with seroussang drainage with palpation. Denies any increased pain or recent fevers during visit. She will continue calcium alginate with offloading shoe. Denies any questions or concerns during visit. 10/26/18 on evaluation today patient appears to be doing about the same as when I last saw her in regard to her wound bed. Fortunately there does not appear to be any signs of infection. Unfortunately she continues to have a breakdown in regard to the toe region any time that she is not in the cast. It takes almost no time at all for this to happen. Nonetheless she still has not heard anything from the brace being made by [employer] as to when exactly this will be available to her. Fortunately there is no signs of infection at this time. 10/30/18 on evaluation today patient presents for application of the total contact cast as we just received him this morning. Fortunately we are gonna be able to apply this to her today which is great news. She continues to have no significant pain which is good news. Overall I do feel like things have been improving while she was the cast is when she doesn't have a cast that things get worse. She still has not really heard anything from [place] regarding her brace. 11/02/18 upon evaluation today patient's wound already appears to be doing significantly better which is good news. Fortunately there does not appear to be any signs of infection also good news. Overall I do think the total contact cast as before is helping to heal this area unfortunately  it's just not gonna likely keep the area closed and healed without her getting her brace at least. Again the foot drop is a significant issue for her. 11/09/18 on evaluation today patient appears to be doing excellent in regard to her toe ulcer which in fact is completely healed. Fortunately we finally got the situation squared away with the paperwork which was needed to proceed with getting her brace approved by Medicaid. I have filled that out unfortunately that information has been sent to the orthopedic office that I worked at 2 1/2 years ago and not tired Current wound care measures. Fortunately she seems to be doing very well at this time. 11/23/18 on evaluation today patient appears to be doing More Poorly Compared to Last Time I Saw Her. At [facility] She Had Completely Healed. Currently she is continuing to have issues with reopening. She states that she just found out that the brace was approved through Medicaid now she just has to go get measured in order to have this fitted for her and then made. Subsequently she does not have an appointment for this yet that is going to complicate things we obviously cannot put her back in the cast if we do not have everything measured because they're not gonna be able to measure her foot while she is in the cast. 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 Obviously this is unfortunate and does have me somewhat worried as well. 11/30/18 on evaluation today patient's toe ulcer actually appears to be doing fairly well. The good news is she will be getting her brace in the shoes next week on Wednesday. Hopefully we will be able to get this to heal without having to go back in the cast  however she may need the cast in order to get the wound completely heal and then go from there. Fortunately there's no signs of infection at this time. 12/07/18 on evaluation today  patient fortunately did receive her brace and she states she could tell this definitely makes her walk better. With that being said she's been having issues with her toe where she noticed yesterday there was a lot of tissue that was loosing off this appears to be much larger than what it was previous. She also states that her leg has been read putting much across the top of her foot just about the ankle although this seems to be receiving somewhat. The total area is still red and appears to be someone infected as best I can tell. She is previously taken Bactrim and that may be a good option for her today as well. We are gonna see what I wound culture shows as well and I think that this is definitely appropriate. With that being said outside of the culture I still need to initiate something in the interim and that's what I'm gonna go ahead and select Bactrim is a good option for her. 12/14/18 on evaluation today patient appears to be doing better in regard to her left great toe ulcer as compared to last week's evaluation. There's still some erythema although this is significantly improved which is excellent news. Overall I do believe that she is making good progress is still gonna take some time before she is where I would like her to be from the standpoint of being able to place her back into the total contact cast. Hopefully we will be where we need to be by next week. 12/21/18 on evaluation today patient actually appears to be doing poorly in regard to her toe ulcer. She's been tolerating the dressing changes without complication. Fortunately there's no signs of systemic infection although she does have a lot of drainage from the toe ulcer and this does seem to be causing some issues at this point. She does have erythema on the distal portion of her toe that appears to be likely cellulitis. 12/28/18 on evaluation today patient actually appears to be doing a little better in my pinion in regard to her toe  ulcer. With that being said she still does have some evidence of infection at this time and for her culture she had both E. coli as well as enterococcus as organisms noted on evaluation. For that reason I think that though the Keflex likely has treated the E. coli rather well this has really done nothing for the enterococcus. We are going to have to initiate treatment for this specifically. [name], [name] ([id number]) 01/04/19 on evaluation today patient's toe actually appears to be doing better from the standpoint of infection. She currently would like to see about putting the cash back on I think that this is appropriate as long as she takes care of it and keeps it from getting wet. She is gonna have some drainage we can definitely pass this up with Drawtex and alginate to try to prevent as much drainage as possible from causing the problems. With that being said I do want to at least try her with the cast between now and Tuesday. If there any issues we can't continue to use it then I will discontinue the use of the cast at that point. 01/08/19 on evaluation today patient actually appears to be doing very well as far as her foot ulcer specifically the great  toe on the left is concerned. She did have an area of rubbing on the medial aspect of her left ankle which again is from the cast. Fortunately there's no signs of infection at this point in this appears to be a very slight skin breakdown. The patient tells me she felt it rubbing but didn't think it was that bad. Fortunately there is no signs of active infection at this time which is good news. No fevers, chills, nausea, or vomiting noted at this time. 01/15/19 on evaluation today patient actually appears to be doing well in regard to her toe ulcer. Again as previous she seems to do well and she has the cast on which indicates to me that during the time she doesn't have a cast on she's putting way too much pressure on this region. Obviously I  think that's gonna be an issue as with the current national emergency concerning the Covid-19 Virus it has been recommended that we discontinue the use of total contact casting by the chief medical officer of our company, Dr. Simona Huh. The reasoning is that if a patient becomes sick and cannot come into have the cast removed they could not just leave this on for an additional two weeks. Obviously the hospitals also do not want to receive patient's who are sick into the emergency department to potentially contaminate the region and spread the Covid-19 Virus among other sick individuals within the hospital system. Therefore at this point we are suspending the use of total contact cast until the current emergency subsides. This was all discussed with the patient today as well. 01/22/19 on evaluation today patient's wound on her left great toe appears to be doing slightly worse than previously noted last week. She tells me that she has been on this quite a bit in fact she tells me she's been awake for 38 straight hours. This is due to the fact that she's having to care for grandparents because nobody else will. She has been taking care of them for five the last seven days since I've seen her they both have dementia his is from a stroke and her grandmother's was progressive. Nonetheless she states even her mom who knows her condition and situation has only help two of those days to take care of them she's been taking care of the rest. Fortunately there does not appear to be any signs of active infection in regard to her toe at this point although obviously it doesn't look as good as it did previous. I think this is directly related to her not taking off the pressure and friction by way of taking things easy. Though I completely understand what's going on. 01/29/19 on evaluation today patient's tools are actually appears to be showing some signs of improvement today compared to last week's evaluation as far as  not necessarily the overall size of the wound but the fact that she has some new skin growth in between the two ends of the wound opening. Overall I feel like she has done well she states that she had a family member give her what sounds to be a CAM walker boot which has been helpful as well. 02/05/19 on evaluation today patient's wound bed actually appears to be doing significantly better in regard to her overall appearance of the size of the wound. With that being said she is still having an issue with offloading efficiently enough to get this to close. Apparently there is some signs of infection at this point as well  unfortunately. Previously she's done well of Augmentin I really do not see anything that needs to be culture currently but there theme and cellulitis of the foot that I'm seeing I'm gonna go ahead and place her on an antibiotic today to try to help clear this up. 02/12/2019 on evaluation today patient actually appears to be doing poorly in regard to her overall wound status. She tells me she has been using her offloading shoe but actually comes in today wearing her tennis shoe with the AFO brace. Again as I previously discussed with her this is really not sufficient to allow the area to heal appropriately. Nonetheless she continues to be somewhat noncompliant and I do wonder based on what she has told my nurse in the past as to whether or not a good portion of this noncompliance may be recreational drug and alcohol related. She has had a history of heroin overdose and this was fairly recently in the past couple of months that have been seeing her. Nonetheless overall I feel like her wound looks significantly worse today compared to what it was previous. She still has significant erythema despite the Augmentin I am not sure that this is an appropriate medication for her infection I am also concerned that the infection may have gone down into her bone. 02/19/19 on evaluation today patient  actually appears to be doing about the same in regard to her toe ulcer. Unfortunately she continues to show signs of bone exposure and infection at this point. There does not appear to be any evidence of worsening of the infection but I'm also not really sure that it's getting significantly better. She is on the Augmentin which should be sufficient for the Staphylococcus aureus infection that she has at this point. With that being said she may need IV antibiotics to more appropriately treat this. We did have a discussion today about hyperbaric option therapy. 02/28/19 on evaluation today patient actually appears to be doing much worse in regard to the wound on her left great toe as compared to even my previous evaluation last week. Unfortunately this seems to be training in a pretty poor direction. Her toe was actually now starting to angle laterally and I can actually see the entire joint area of the proximal portion of the digit where is the distal portion of the digit again is no longer even in contact with the joint line. Unfortunately there's a lot more necrotic tissue around the edge and the toe appears to be showing signs of becoming gangrenous in my pinion. I'm very concerned about were things stand at this point. She did see infectious disease and they are planning to send in a prescription for Sivextro for her and apparently this has been approved. With that being said I don't think she should avoid taking this but at the same time I'm not sure that it's gonna be sufficient to save her toe at this point. 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 03/05/19 on evaluation today patient actually appears to be doing quite well in regard to her toe all things considering. She still has bone  exposed but there appears to be much less your thing on overall the appearance of the wound and the toe itself is dramatically improved. She still does  have some issues currently obviously with infection she did see vascular as well and there concerned that her blood flow to the toad. For that reason they are setting up for an angiogram next week. 03/14/19 on evaluation today patient appears to be doing very poor in regard to her toe and specifically in regard to the ulceration and the fact that she's starting to notice the toe was leaning even more towards the lateral aspect and the complete joint is visible on the proximal aspect of the joint. Nonetheless she's also noted a significant odor and the tip of the toe is turning more dark and necrotic appearing. Overall I think she is getting worse not better as far as this is concerned. For that reason I am recommending at this point that she likely needs to be seen for likely amputation. READMISSION 03/19/2021 This is a patient that we cared for in this clinic for a prolonged period of time in 2019 and 2020 with a left foot and left first toe wound. 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 ([id number]) 04/09/20 by Dr. Luana Shu. In December 2021 she had an ulcer on her right great toe as well as the fourth and fifth toes. 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 She also had an angiogram at that time and underwent angioplasty of the right anterior tibial artery. In any case she claims that the wound on the right foot is closed I did not look at this today which was probably an oversight although I think that should be done next week. After her surgery she developed a dehiscence but I do not see any follow-up of this. According to Dr. Deborra Medina last review that she was out of the area being cared for  by another physician but recently came back to his attention. 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 A culture of this area showed E. coli apparently before she came back to see Dr. Luana Shu she was supposed to be receiving antibiotics but she did not really take them. Nor is she offloading this area at all. Finally her last hemoglobin A1c listed in epic was in March 2022 at 14.1 she says things are a lot better since then although I am not sure. 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 She had this wound at the time. During this admission she had E. coli sepsis a TEE was negative. 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 She is still a heavy cigarette smoker. She has a history of bipolar disorder seizure disorder ABI in our clinic was 1.05 6/1; left midfoot in the setting of a TMA done previously. Round circular wound with a "knuckle" of protruding tissue. The problem is that the knuckle was not attached to any of the surrounding granulation and this probed proximally widely I removed a large portion of this tissue. This wound goes with considerable undermining laterally. I do not feel any bone there was no purulence but this is a deep wound. 6/8; in spite of the debridement I did last week. She arrives with a  wound looking exactly the same. A protruding "knuckle" of tissue nonadherent to most of the surrounding tissue. There is considerable depth around this from 6-12 o'clock at 2.7 cm and undermining of 1 cm. This does not look overtly infected and the x-ray I did last week was negative for any osseous abnormalities. We have been using silver  collagen Objective Constitutional Sitting or standing Blood Pressure is within target range for patient.. Pulse regular and within target range for patient.Marland Kitchen Respirations regular, non- labored and within target range.. Temperature is normal and within the target range for the patient.Marland Kitchen appears in no distress. Vitals Time Taken: 1:00 PM, Height: 69 in, Weight: 185 lbs, BMI: 27.3, Temperature: 98.6 F, Pulse: 94 bpm, Respiratory Rate: 16 breaths/min, Blood Pressure: 113/80 mmHg. General Notes: Wound exam; surprisingly the tissue I removed last week was replaced in the course of 1 week by something that looked exactly the same. Once again using a 15 scalpel and pickups I remove this but this time sent specimens for pathology and culture. The base of this goes to the tendon but it does not go to bone. There is considerable undermining from 6-12 o'clock of this deep punched out wound. Integumentary (Hair, Skin) Wound #7 status is Open. Original cause of wound was Gradually Appeared. The date acquired was: 08/07/2020. The wound has been in treatment 1 weeks. The wound is located on the [place]. The wound measures 1.8cm length x 1.6cm width x 2cm depth; 2.262cm^2 area and 4.524cm^3 volume. There is Fat Layer (Subcutaneous Tissue) exposed. There is no tunneling noted, however, there is undermining starting at 6:00 and ending at 12:00 with a maximum distance of 1.5cm. There is a medium amount of serous drainage noted. The wound margin is epibole. There is small (1-33%) pale granulation within the wound bed. There is a large (67-100%) amount of necrotic tissue within the wound bed including Adherent Slough. Assessment Active Problems ICD-10 Type 2 diabetes mellitus with foot ulcer 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ity Type 2 diabetes mellitus with diabetic polyneuropathy Procedures Givan, Calandra A. ([id number]) Wound #7 Pre-procedure  diagnosis of Wound #7 is a Pressure Ulcer located on the Left,Midline,Plantar Foot . There was a Excisional Skin/Subcutaneous Tissue Debridement with a total area of 2.88 sq cm performed by Ricard [name], MD. With the following instrument(s): Blade, Curette, and Forceps to remove Viable and Non-Viable tissue/material. Material removed includes Subcutaneous Tissue after achieving pain control using Lidocaine. A time out was conducted at 13:15, prior to the start of the procedure. A Moderate amount of bleeding was controlled with Silver Nitrate. The procedure was tolerated well. Post Debridement Measurements: 1.8cm length x 1.6cm width x 2.2cm depth; 4.976cm^3 volume. Post debridement Stage noted as Category/Stage II. Character of Wound/Ulcer Post Debridement is stable. Post procedure Diagnosis Wound #7: Same as Pre-Procedure Plan Follow-up Appointments: Return Appointment in 1 week. Bathing/ Shower/ Hygiene: Clean wound with Normal Saline or wound cleanser. Off-Loading: Open toe surgical shoe with peg assist. Laboratory ordered were: Wound culture routine - Left plantar foot, Biopsy specimen culture - Left Plantar Foot Radiology ordered were: Computed Tomography (CT) Scan - Left Foot Without contrast - non-healing wound on plantar foot WOUND #7: - Foot Wound Laterality: Plantar, Left, Midline Cleanser: Soap and Water 3 x Per Week/30 Days Discharge Instructions: Gently cleanse wound with antibacterial soap, rinse and pat dry prior to dressing wounds Primary Dressing: Prisma 4.34 (in) 3 x Per Week/30 Days Discharge Instructions: Moisten with  hydrogel-tuck in undermining area Secondary Dressing: Bordered Gauze Sterile-HBD 4x4 (in/in) 3 x Per Week/30 Days Discharge Instructions: Cover wound with Bordered Guaze Sterile as directed 1. This time I removed protruding nonadherent tissue from the majority of the surface of this wound but sent a specimen for pathology and culture. 2. This seems  to have replaced itself so rapidly I wondered about a malignancy and/or an underlying infection that sometimes causes protruding tissue to look like this. 3. The patient states she has hardware somewhere from orthopedic issues therefore I have ordered a CT scan of the foot unenhanced [renal disease] 4. This wound is not even close to even looking like it might heal. Each time she has had this protruding area of tissue that is nonadherent to most of the surrounding tissue. 5. Still used silver collagen Electronic Signature(s) Signed: 03/31/2021 4:14:58 PM By: Linton Ham MD Entered By: Linton Ham on 03/31/2021 13:38:51 [name] ([id number]) -------------------------------------------------------------------------------- SuperBill Details Patient Name: Heather Dear A. Date of Service: 03/31/2021 Medical Record Number: [id number] Patient Account Number: [id number] Date of Birth/Sex: 03-Jul-1976 (45 y.o. F) Treating RN: Cornell Barman Primary Care Provider: Raelene Bott Other Clinician: Referring Provider: Raelene Bott Treating Provider/Extender: Tito Dine in Treatment: 1 Diagnosis Coding ICD-10 Codes Code Description E11.621 Type 2 diabetes mellitus with foot ulcer 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ity E11.42 Type 2 diabetes mellitus with diabetic polyneuropathy Facility Procedures CPT4 Code: [id number] Description: [id number] - DEB SUBQ TISSUE 20 SQ CM/< Modifier: Quantity: 1 CPT4 Code: Description: ICD-10 Diagnosis Description 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 Modifier: severity Quantity: Physician Procedures CPT4 Code: [id number] Description: 11042 - WC PHYS SUBQ TISS 20 SQ CM Modifier: Quantity: 1 CPT4 Code: Description: ICD-10 Diagnosis Description 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 Modifier:  severity Quantity: Electronic Signature(s) Signed: 03/31/2021 4:14:58 PM By: Linton Ham MD Entered By: Linton Ham on 03/31/2021 13:40:36

## 2021-04-01 NOTE — Progress Notes (Signed)
[name] ([id number]) Visit Report for 03/31/2021 Arrival Information Details Patient Name: [name] A. Date of Service: 03/31/2021 12:45 PM Medical Record Number: [id number] Patient Account Number: [id number] Date of Birth/Sex: [birth year] (45 y.o. F) Treating RN: Donnamarie Poag Primary Care Ghazal Pevey: Raelene Bott Other Clinician: Referring Daziah Hesler: Raelene Bott Treating Iain Sawchuk/Extender: Suella Grove in Treatment: 1 Visit Information History Since Last Visit Added or deleted any medications: No Patient Arrived: Wheel Chair Had a fall or experienced change in No Arrival Time: 12:58 activities of daily living that may affect Accompanied By: mother risk of falls: Transfer Assistance: Easy[employer] Patient Lift Hospitalized since last visit: No Patient Identification Verified: Yes Has Dressing in Place as Prescribed: Yes Secondary Verification Process Completed: Yes Pain Present Now: Yes Patient Requires Transmission-Based No Precautions: Patient Has Alerts: Yes Patient Alerts: Patient on Blood Thinner '81mg'$  apirin Diabetic Type II Electronic Signature(s) Signed: 03/31/2021 2:11:15 PM By: Donnamarie Poag Entered By: Donnamarie Poag on 03/31/2021 12:58:38 [name] ([id number]) -------------------------------------------------------------------------------- Encounter Discharge Information Details Patient Name: Heather Dear A. Date of Service: 03/31/2021 12:45 PM Medical Record Number: [id number] Patient Account Number: [id number] Date of Birth/Sex: [birth year] (45 y.o. F) Treating RN: Cornell Barman Primary Care Isiaah Cuervo: Raelene Bott Other Clinician: Referring Blayton Huttner: Raelene Bott Treating Iris Hairston/Extender: Tito Dine in Treatment: 1 Encounter Discharge Information Items Post Procedure Vitals Discharge Condition: Stable Temperature (F): 98.6 Ambulatory Status: Wheelchair Pulse (bpm): 94 Discharge Destination: Home Respiratory Rate (breaths/min):  16 Transportation: Private Auto Blood Pressure (mmHg): 113/80 Accompanied By: Mother Schedule Follow-up Appointment: Yes Clinical Summary of Care: Electronic Signature(s) Signed: 03/31/2021 4:36:28 PM By: Gretta Cool, BSN, RN, CWS, [name] RN, BSN Entered By: Gretta Cool, BSN, RN, CWS, [name] on 03/31/2021 13:31:13 [name] ([id number]) -------------------------------------------------------------------------------- Lower Extremity Assessment Details Patient Name: Heather Dear A. Date of Service: 03/31/2021 12:45 PM Medical Record Number: [id number] Patient Account Number: [id number] Date of Birth/Sex: 17-Sep-1976 (45 y.o. F) Treating RN: Donnamarie Poag Primary Care Mariany Mackintosh: Raelene Bott Other Clinician: Referring Margareta Laureano: Raelene Bott Treating Cyrus Ramsburg/Extender: Suella Grove in Treatment: 1 Edema Assessment Assessed: [Left: Yes] [Right: No] Edema: [Left: N] [Right: o] Electronic Signature(s) Signed: 03/31/2021 2:11:15 PM By: Donnamarie Poag Entered By: Donnamarie Poag on 03/31/2021 13:06:07 [name], [name] ([id number]) -------------------------------------------------------------------------------- Multi Wound Chart Details Patient Name: Heather Dear A. Date of Service: 03/31/2021 12:45 PM Medical Record Number: [id number] Patient Account Number: [id number] Date of Birth/Sex: Mar 10, 1976 (45 y.o. F) Treating RN: Cornell Barman Primary Care Laurene Melendrez: Raelene Bott Other Clinician: Referring Suleyman Ehrman: Raelene Bott Treating Taylan Mayhan/Extender: Tito Dine in Treatment: 1 Vital Signs Height(in): 81 Pulse(bpm): 76 Weight(lbs): 185 Blood Pressure(mmHg): 113/80 Body Mass Index(BMI): 27 Temperature(F): 98.6 Respiratory Rate(breaths/min): 16 Photos: [N/A:N/A] Wound Location: Left, Midline, Plantar Foot N/A N/A Wounding Event: Gradually Appeared N/A N/A Primary Etiology: Pressure Ulcer N/A N/A Comorbid History: Chronic sinus problems/congestion, N/A N/A Middle ear problems, Anemia, Chronic  Obstructive Pulmonary Disease (COPD), Congestive Heart Failure, Type II Diabetes, End Stage Renal Disease, History of pressure wounds, Neuropathy Date Acquired: 08/07/2020 N/A N/A Weeks of Treatment: 1 N/A N/A Wound Status: Open N/A N/A Measurements L x W x D (cm) 1.8x1.6x2 N/A N/A Area (cm) : 2.262 N/A N/A Volume (cm) : 4.524 N/A N/A % Reduction in Area: 15.30% N/A N/A % Reduction in Volume: -238.90% N/A N/A Starting Position 1 (o'clock): 6 Ending Position 1 (o'clock): 12 Maximum Distance 1 (cm): 1.5 Undermining: Yes N/A N/A Classification: Category/Stage II N/A N/A Exudate Amount: Medium N/A N/A Exudate Type: Serous N/A N/A Exudate Color: amber N/A N/A  Wound Margin: Epibole N/A N/A Granulation Amount: Small (1-33%) N/A N/A Granulation Quality: Pale N/A N/A Necrotic Amount: Large (67-100%) N/A N/A Exposed Structures: Fat Layer (Subcutaneous Tissue): N/A N/A Yes Fascia: No Tendon: No Muscle: No Joint: No Bone: No Epithelialization: None N/A N/A Debridement: Debridement - Excisional N/A N/A Pre-procedure Verification/Time 13:15 N/A N/A Out Taken: Pain Control: Lidocaine N/A N/A Tissue Debrided: Subcutaneous N/A N/A [name], [name] ([id number]) Level: Skin/Subcutaneous Tissue N/A N/A Debridement Area (sq cm): 2.88 N/A N/A Instrument: Blade, Curette, Forceps N/A N/A Bleeding: Moderate N/A N/A Hemostasis Achieved: Silver Nitrate N/A N/A Debridement Treatment Procedure was tolerated well N/A N/A Response: Post Debridement 1.8x1.6x2.2 N/A N/A Measurements L x W x D (cm) Post Debridement Volume: 4.976 N/A N/A (cm) Post Debridement Stage: Category/Stage II N/A N/A Procedures Performed: Debridement N/A N/A Treatment Notes Wound #7 (Foot) Wound Laterality: Plantar, Left, Midline Cleanser Soap and Water Discharge Instruction: Gently cleanse wound with antibacterial soap, rinse and pat dry prior to dressing wounds Peri-Wound Care Topical Primary Dressing Prisma  4.34 (in) Discharge Instruction: Moisten with hydrogel-tuck in undermining area Secondary Dressing Bordered Gauze Sterile-HBD 4x4 (in/in) Discharge Instruction: Cover wound with Bordered Guaze Sterile as directed Secured With Compression Wrap Compression Stockings Add-Ons Electronic Signature(s) Signed: 03/31/2021 4:14:58 PM By: Linton Ham MD Entered By: Linton Ham on 03/31/2021 13:32:30 [name] ([id number]) -------------------------------------------------------------------------------- [place] Details Patient Name: Heather Dear A. Date of Service: 03/31/2021 12:45 PM Medical Record Number: [id number] Patient Account Number: [id number] Date of Birth/Sex: Sep 23, 1976 (45 y.o. F) Treating RN: Cornell Barman Primary Care Kymberlee Viger: Raelene Bott Other Clinician: Referring Anum Palecek: Raelene Bott Treating Nizhoni Parlow/Extender: Tito Dine in Treatment: 1 Active Inactive Abuse / Safety / Falls / Self Care Management Nursing Diagnoses: Potential for injury related to falls Goals: Patient/caregiver will verbalize understanding of skin care regimen Date Initiated: 03/19/2021 Target Resolution Date: 04/19/2021 Goal Status: Active Patient/caregiver will verbalize/demonstrate measures taken to prevent injury and/or falls Date Initiated: 03/19/2021 Target Resolution Date: 04/19/2021 Goal Status: Active Interventions: Assess Activities of Daily Living upon admission and as needed Assess fall risk on admission and as needed Assess: immobility, friction, shearing, incontinence upon admission and as needed Assess impairment of mobility on admission and as needed per policy Assess personal safety and home safety (as indicated) on admission and as needed Assess self care needs on admission and as needed Notes: Wound/Skin Impairment Nursing Diagnoses: Knowledge deficit related to ulceration/compromised skin integrity Goals: Patient/caregiver will  verbalize understanding of skin care regimen Date Initiated: 03/19/2021 Target Resolution Date: 04/19/2021 Goal Status: Active Ulcer/skin breakdown will have a volume reduction of 30% by week 4 Date Initiated: 03/19/2021 Target Resolution Date: 04/19/2021 Goal Status: Active Ulcer/skin breakdown will have a volume reduction of 50% by week 8 Date Initiated: 03/19/2021 Target Resolution Date: 05/19/2021 Goal Status: Active Ulcer/skin breakdown will have a volume reduction of 80% by week 12 Date Initiated: 03/19/2021 Target Resolution Date: 06/19/2021 Goal Status: Active Ulcer/skin breakdown will heal within 14 weeks Date Initiated: 03/19/2021 Target Resolution Date: 07/20/2021 Goal Status: Active Interventions: Assess patient/caregiver ability to obtain necessary supplies Assess patient/caregiver ability to perform ulcer/skin care regimen upon admission and as needed Assess ulceration(s) every visit Notes: [name], [name] ([id number]) Electronic Signature(s) Signed: 03/31/2021 4:36:28 PM By: Gretta Cool, BSN, RN, CWS, [name] RN, BSN Entered By: Gretta Cool, BSN, RN, CWS, [name] on 03/31/2021 13:22:54 [name] ([id number]) -------------------------------------------------------------------------------- Pain Assessment Details Patient Name: Heather Dear A. Date of Service: 03/31/2021 12:45 PM Medical Record Number: [id number] Patient  Account Number: [id number] Date of Birth/Sex: 10-06-1976 (45 y.o. F) Treating RN: Donnamarie Poag Primary Care Julion Gatt: Raelene Bott Other Clinician: Referring Charels Stambaugh: Raelene Bott Treating Lowell Mcgurk/Extender: Suella Grove in Treatment: 1 Active Problems Location of Pain Severity and Description of Pain Patient Has Paino No Site Locations Rate the pain. Current Pain Level: 0 Pain Management and Medication Current Pain Management: Electronic Signature(s) Signed: 03/31/2021 2:11:15 PM By: Donnamarie Poag Entered By: Donnamarie Poag on 03/31/2021 13:01:41 [name]  ([id number]) -------------------------------------------------------------------------------- Patient/Caregiver Education Details Patient Name: Heather Dear A. Date of Service: 03/31/2021 12:45 PM Medical Record Number: [id number] Patient Account Number: [id number] Date of Birth/Gender: 11-20-1975 (45 y.o. F) Treating RN: Cornell Barman Primary Care Physician: Raelene Bott Other Clinician: Referring Physician: Raelene Bott Treating Physician/Extender: Tito Dine in Treatment: 1 Education Assessment Education Provided To: Patient Education Topics Provided Wound Debridement: Handouts: Wound Debridement Methods: Demonstration, Explain/Verbal Responses: State content correctly Electronic Signature(s) Signed: 03/31/2021 4:36:28 PM By: Gretta Cool, BSN, RN, CWS, [name] RN, BSN Entered By: Gretta Cool, BSN, RN, CWS, [name] on 03/31/2021 13:30:13 [name] ([id number]) -------------------------------------------------------------------------------- Wound Assessment Details Patient Name: Heather Dear A. Date of Service: 03/31/2021 12:45 PM Medical Record Number: [id number] Patient Account Number: [id number] Date of Birth/Sex: 02-27-1976 (45 y.o. F) Treating RN: Donnamarie Poag Primary Care Sofiah Lyne: Raelene Bott Other Clinician: Referring Karma Hiney: Raelene Bott Treating Ezriel Boffa/Extender: Suella Grove in Treatment: 1 Wound Status Wound Number: 7 Primary Pressure Ulcer Etiology: Wound Location: Left, Midline, Plantar Foot Wound Open Wounding Event: Gradually Appeared Status: Date Acquired: 08/07/2020 Comorbid Chronic sinus problems/congestion, Middle ear problems, Weeks Of Treatment: 1 History: Anemia, Chronic Obstructive Pulmonary Disease (COPD), Clustered Wound: No Congestive Heart Failure, Type II Diabetes, End Stage Renal Disease, History of pressure wounds, Neuropathy Photos Wound Measurements Length: (cm) 1.8 % Width: (cm) 1.6 % Depth: (cm) 2 Ep Area: (cm) 2.262 T Volume: (cm)  4.524 U Reduction in Area: 15.3% Reduction in Volume: -238.9% ithelialization: None unneling: No ndermining: Yes Starting Position (o'clock): 6 Ending Position (o'clock): 12 Maximum Distance: (cm) 1.5 Wound Description Classification: Category/Stage II F Wound Margin: Epibole S Exudate Amount: Medium Exudate Type: Serous Exudate Color: amber oul Odor After Cleansing: No lough/Fibrino Yes Wound Bed Granulation Amount: Small (1-33%) Exposed Structure Granulation Quality: Pale Fascia Exposed: No Necrotic Amount: Large (67-100%) Fat Layer (Subcutaneous Tissue) Exposed: Yes Necrotic Quality: Adherent Slough Tendon Exposed: No Muscle Exposed: No Joint Exposed: No Bone Exposed: No Treatment Notes Wound #7 (Foot) Wound Laterality: Plantar, Left, Midline [name], [name] ([id number]) Cleanser Soap and Water Discharge Instruction: Gently cleanse wound with antibacterial soap, rinse and pat dry prior to dressing wounds Peri-Wound Care Topical Primary Dressing Prisma 4.34 (in) Discharge Instruction: Moisten with hydrogel-tuck in undermining area Secondary Dressing Bordered Gauze Sterile-HBD 4x4 (in/in) Discharge Instruction: Cover wound with Bordered Guaze Sterile as directed Secured With Compression Wrap Compression Stockings Add-Ons Electronic Signature(s) Signed: 03/31/2021 2:11:15 PM By: Donnamarie Poag Signed: 03/31/2021 4:36:28 PM By: Gretta Cool, BSN, RN, CWS, [name] RN, BSN Entered By: Gretta Cool, BSN, RN, CWS, [name] on 03/31/2021 13:17:16 [name] ([id number]) -------------------------------------------------------------------------------- [place] Details Patient Name: Heather Dear A. Date of Service: 03/31/2021 12:45 PM Medical Record Number: [id number] Patient Account Number: [id number] Date of Birth/Sex: 16-Nov-1975 (45 y.o. F) Treating RN: Donnamarie Poag Primary Care Kemontae Dunklee: Raelene Bott Other Clinician: Referring Bentley Haralson: Raelene Bott Treating Jerianne Anselmo/Extender: Suella Grove in  Treatment: 1 Vital Signs Time Taken: 13:00 Temperature (F): 98.6 Height (in): 69 Pulse (bpm): 94 Weight (lbs): 185 Respiratory Rate (breaths/min): 16 Body Mass Index (BMI): 27.3 Blood  Pressure (mmHg): 113/80 Reference Range: 80 - 120 mg / dl Electronic Signature(s) Signed: 03/31/2021 2:11:15 PM By: Donnamarie Poag Entered By: Donnamarie Poag on 03/31/2021 13:01:32

## 2021-04-03 LAB — AEROBIC CULTURE W GRAM STAIN (SUPERFICIAL SPECIMEN)

## 2021-04-05 ENCOUNTER — Other Ambulatory Visit: Payer: Self-pay | Admitting: Internal Medicine

## 2021-04-05 DIAGNOSIS — E11621 Type 2 diabetes mellitus with foot ulcer: Secondary | ICD-10-CM

## 2021-04-05 DIAGNOSIS — L97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ity: Secondary | ICD-10-CM

## 2021-04-05 LAB — SURGICAL PATHOLOGY

## 2021-04-07 ENCOUNTER — Encounter: Payer: Medicaid Other | Admitting: Internal Medicine

## 2021-04-07 ENCOUNTER — Other Ambulatory Visit: Payer: Self-pay

## 2021-04-07 DIAGNOSIS — E11621 Type 2 diabetes mellitus with foot ulcer: Secondary | ICD-10-CM | POA: Diagnosis not present

## 2021-04-08 NOTE — Progress Notes (Signed)
JAMILETT, FREEHILL ([id number]) Visit Report for 04/07/2021 Arrival Information Details Patient Name: [name], [name] A. Date of Service: 04/07/2021 12:45 PM Medical Record Number: [id number] Patient Account Number: [id number] Date of Birth/Sex: 12/28/1975 (45 y.o. F) Treating RN: Carlene Coria Primary Care Xavious Sharrar: Raelene Bott Other Clinician: Referring Aviah Sorci: Raelene Bott Treating Renad Jenniges/Extender: Tito Dine in Treatment: 2 Visit Information History Since Last Visit All ordered tests and consults were completed: No Patient Arrived: Wheel Chair Added or deleted any medications: No Arrival Time: 12:53 Any new allergies or adverse reactions: No Accompanied By: mother Had a fall or experienced change in No Transfer Assistance: None activities of daily living that may affect Patient Identification Verified: Yes risk of falls: Secondary Verification Process Completed: Yes Signs or symptoms of abuse/neglect since last visito No Patient Requires Transmission-Based No Hospitalized since last visit: No Precautions: Implantable device outside of the clinic excluding No Patient Has Alerts: Yes cellular tissue based products placed in the center Patient Alerts: Patient on Blood since last visit: Thinner Has Dressing in Place as Prescribed: Yes '81mg'$  apirin Pain Present Now: Yes Diabetic Type II Electronic Signature(s) Signed: 04/07/2021 3:34:38 PM By: Carlene Coria RN Entered By: Carlene Coria on 04/07/2021 12:54:16 [name] AMarland [name] ([id number]) -------------------------------------------------------------------------------- Encounter Discharge Information Details Patient Name: Heather Dear A. Date of Service: 04/07/2021 12:45 PM Medical Record Number: [id number] Patient Account Number: [id number] Date of Birth/Sex: 01-31-1976 (45 y.o. F) Treating RN: Cornell Barman Primary Care Tavarion Babington: Raelene Bott Other Clinician: Referring Malaiya Paczkowski: Raelene Bott Treating  Fernando Stoiber/Extender: Tito Dine in Treatment: 2 Encounter Discharge Information Items Post Procedure Vitals Discharge Condition: Stable Temperature (F): 98.8 Ambulatory Status: Wheelchair Pulse (bpm): 78 Discharge Destination: Home Respiratory Rate (breaths/min): 18 Transportation: Private Auto Blood Pressure (mmHg): 90/67 Accompanied By: mother Schedule Follow-up Appointment: No Clinical Summary of Care: Electronic Signature(s) Signed: 04/08/2021 3:50:18 PM By: Jeanine Luz Entered By: Jeanine Luz on 04/07/2021 13:39:02 [name] ([id number]) -------------------------------------------------------------------------------- Lower Extremity Assessment Details Patient Name: Heather Dear A. Date of Service: 04/07/2021 12:45 PM Medical Record Number: [id number] Patient Account Number: [id number] Date of Birth/Sex: [birth year] (45 y.o. F) Treating RN: Carlene Coria Primary Care Abdulhamid Olgin: Raelene Bott Other Clinician: Referring Pinkie Manger: Raelene Bott Treating Gerald Kuehl/Extender: Ricard [name] Weeks in Treatment: 2 Edema Assessment Assessed: [Left: No] [Right: No] Edema: [Left: Ye] [Right: s] Vascular Assessment Pulses: Dorsalis Pedis Palpable: [Left:Yes] Electronic Signature(s) Signed: 04/07/2021 3:34:38 PM By: Carlene Coria RN Entered By: Carlene Coria on 04/07/2021 13:03:18 Pickar, Dashia A. ([id number]) -------------------------------------------------------------------------------- Multi Wound Chart Details Patient Name: Heather Dear A. Date of Service: 04/07/2021 12:45 PM Medical Record Number: [id number] Patient Account Number: [id number] Date of Birth/Sex: 05-18-1976 (45 y.o. F) Treating RN: Cornell Barman Primary Care Teejay Meader: Raelene Bott Other Clinician: Referring Elayjah Chaney: Raelene Bott Treating Lem Peary/Extender: Tito Dine in Treatment: 2 Vital Signs Height(in): 69 Capillary Blood Glucose 179 (mg/dl): Weight(lbs):  185 Pulse(bpm): 58 Body Mass Index(BMI): 27 Blood Pressure(mmHg): 90/67 Temperature(F): 98.8 Respiratory Rate(breaths/min): 18 Photos: [N/A:N/A] Wound Location: Left, Midline, Plantar Foot N/A N/A Wounding Event: Gradually Appeared N/A N/A Primary Etiology: Pressure Ulcer N/A N/A Comorbid History: Chronic sinus problems/congestion, N/A N/A Middle ear problems, Anemia, Chronic Obstructive Pulmonary Disease (COPD), Congestive Heart Failure, Type II Diabetes, End Stage Renal Disease, History of pressure wounds, Neuropathy Date Acquired: 08/07/2020 N/A N/A Weeks of Treatment: 2 N/A N/A Wound Status: Open N/A N/A Measurements L x W x D (cm) 1.7x2x1.1 N/A N/A Area (cm) : 2.67 N/A N/A Volume (cm) : 2.937 N/A N/A %  Reduction in Area: 0.00% N/A N/A % Reduction in Volume: -120.00% N/A N/A Starting Position 1 (o'clock): 12 Ending Position 1 (o'clock): 12 Maximum Distance 1 (cm): 1.5 Undermining: Yes N/A N/A Classification: Category/Stage II N/A N/A Exudate Amount: Medium N/A N/A Exudate Type: Serous N/A N/A Exudate Color: amber N/A N/A Wound Margin: Epibole N/A N/A Granulation Amount: None Present (0%) N/A N/A Necrotic Amount: Large (67-100%) N/A N/A Exposed Structures: Fat Layer (Subcutaneous Tissue): N/A N/A Yes Fascia: No Tendon: No Muscle: No Joint: No Bone: No Epithelialization: None N/A N/A Debridement: Debridement - Excisional N/A N/A Pre-procedure Verification/Time 13:19 N/A N/A Out Taken: Tissue Debrided: Subcutaneous N/A N/A Level: Skin/Subcutaneous Tissue N/A N/A Debridement Area (sq cm): 3.4 N/A N/A Deahl, Franny A. ([id number]) Instrument: Curette N/A N/A Bleeding: Minimum N/A N/A Hemostasis Achieved: Pressure N/A N/A Debridement Treatment Procedure was tolerated well N/A N/A Response: Post Debridement 1.7x2x1.1 N/A N/A Measurements L x W x D (cm) Post Debridement Volume: 2.937 N/A N/A (cm) Post Debridement Stage: Category/Stage II N/A  N/A Procedures Performed: Debridement N/A N/A Treatment Notes Electronic Signature(s) Signed: 04/07/2021 4:10:05 PM By: Linton Ham MD Entered By: Linton Ham on 04/07/2021 13:25:50 [name] ([id number]) -------------------------------------------------------------------------------- Shattuck Details Patient Name: Heather Dear A. Date of Service: 04/07/2021 12:45 PM Medical Record Number: [id number] Patient Account Number: [id number] Date of Birth/Sex: August 09, 1976 (45 y.o. F) Treating RN: Cornell Barman Primary Care Norwin Aleman: Raelene Bott Other Clinician: Referring Erminia Mcnew: Raelene Bott Treating Aasir [name]/Extender: Tito Dine in Treatment: 2 Active Inactive Abuse / Safety / Falls / Self Care Management Nursing Diagnoses: Potential for injury related to falls Goals: Patient/caregiver will verbalize understanding of skin care regimen Date Initiated: 03/19/2021 Target Resolution Date: 04/19/2021 Goal Status: Active Patient/caregiver will verbalize/demonstrate measures taken to prevent injury and/or falls Date Initiated: 03/19/2021 Target Resolution Date: 04/19/2021 Goal Status: Active Interventions: Assess Activities of Daily Living upon admission and as needed Assess fall risk on admission and as needed Assess: immobility, friction, shearing, incontinence upon admission and as needed Assess impairment of mobility on admission and as needed per policy Assess personal safety and home safety (as indicated) on admission and as needed Assess self care needs on admission and as needed Notes: Necrotic Tissue Nursing Diagnoses: Impaired tissue integrity related to necrotic/devitalized tissue Knowledge deficit related to management of necrotic/devitalized tissue Goals: Necrotic/devitalized tissue will be minimized in the wound bed Date Initiated: 04/07/2021 Target Resolution Date: 04/07/2021 Goal Status: Active Patient/caregiver will  verbalize understanding of reason and process for debridement of necrotic tissue Date Initiated: 04/07/2021 Target Resolution Date: 04/07/2021 Goal Status: Active Interventions: Assess patient pain level pre-, during and post procedure and prior to discharge Provide education on necrotic tissue and debridement process Treatment Activities: Excisional debridement : 04/07/2021 Notes: Wound/Skin Impairment Nursing Diagnoses: Knowledge deficit related to ulceration/compromised skin integrity Goals: Patient/caregiver will verbalize understanding of skin care regimen LYVONNE, NISWONGER ([id number]) Date Initiated: 03/19/2021 Target Resolution Date: 04/19/2021 Goal Status: Active Ulcer/skin breakdown will have a volume reduction of 30% by week 4 Date Initiated: 03/19/2021 Target Resolution Date: 04/19/2021 Goal Status: Active Ulcer/skin breakdown will have a volume reduction of 50% by week 8 Date Initiated: 03/19/2021 Target Resolution Date: 05/19/2021 Goal Status: Active Ulcer/skin breakdown will have a volume reduction of 80% by week 12 Date Initiated: 03/19/2021 Target Resolution Date: 06/19/2021 Goal Status: Active Ulcer/skin breakdown will heal within 14 weeks Date Initiated: 03/19/2021 Target Resolution Date: 07/20/2021 Goal Status: Active Interventions: Assess patient/caregiver ability to obtain necessary supplies Assess patient/caregiver ability  to perform ulcer/skin care regimen upon admission and as needed Assess ulceration(s) every visit Notes: Electronic Signature(s) Signed: 04/07/2021 5:10:04 PM By: Gretta Cool, BSN, RN, CWS, [name] RN, BSN Entered By: Gretta Cool, BSN, RN, CWS, [name] on 04/07/2021 13:19:15 [name] ([id number]) -------------------------------------------------------------------------------- Pain Assessment Details Patient Name: Heather Dear A. Date of Service: 04/07/2021 12:45 PM Medical Record Number: [id number] Patient Account Number: [id number] Date of Birth/Sex:  1976-02-24 (45 y.o. F) Treating RN: Carlene Coria Primary Care Torrie Namba: Raelene Bott Other Clinician: Referring Corrin Hingle: Raelene Bott Treating Faylene Allerton/Extender: Tito Dine in Treatment: 2 Active Problems Location of Pain Severity and Description of Pain Patient Has Paino Yes Site Locations With Dressing Change: Yes Duration of the Pain. Constant / Intermittento Constant Rate the pain. Current Pain Level: 9 Worst Pain Level: 10 Least Pain Level: 5 Tolerable Pain Level: 5 Character of Pain Describe the Pain: Burning Pain Management and Medication Current Pain Management: Medication: Yes Cold Application: No Rest: Yes Massage: No Activity: No T.E.N.S.: No Heat Application: No Leg drop or elevation: No Is the Current Pain Management Adequate: Inadequate How does your wound impact your activities of daily livingo Sleep: Yes Bathing: No Appetite: Yes Relationship With Others: No Bladder Continence: No Emotions: No Bowel Continence: No Work: No Toileting: No Drive: No Dressing: No Hobbies: No Electronic Signature(s) Signed: 04/07/2021 3:34:38 PM By: Carlene Coria RN Entered By: Carlene Coria on 04/07/2021 12:55:43 [name] ([id number]) -------------------------------------------------------------------------------- Patient/Caregiver Education Details Patient Name: Heather Dear A. Date of Service: 04/07/2021 12:45 PM Medical Record Number: [id number] Patient Account Number: [id number] Date of Birth/Gender: Dec 19, 1975 (45 y.o. F) Treating RN: Cornell Barman Primary Care Physician: Raelene Bott Other Clinician: Referring Physician: Raelene Bott Treating Physician/Extender: Tito Dine in Treatment: 2 Education Assessment Education Provided To: Patient Education Topics Provided Wound Debridement: Handouts: Wound Debridement Methods: Demonstration, Explain/Verbal Responses: State content correctly Electronic Signature(s) Signed:  04/07/2021 5:10:04 PM By: Gretta Cool, BSN, RN, CWS, [name] RN, BSN Entered By: Gretta Cool, BSN, RN, CWS, [name] on 04/07/2021 13:26:10 [name] ([id number]) -------------------------------------------------------------------------------- Wound Assessment Details Patient Name: Heather Dear A. Date of Service: 04/07/2021 12:45 PM Medical Record Number: [id number] Patient Account Number: [id number] Date of Birth/Sex: December 05, 1975 (45 y.o. F) Treating RN: Carlene Coria Primary Care Terressa Evola: Raelene Bott Other Clinician: Referring Momina Hunton: Raelene Bott Treating Jordy Hewins/Extender: Tito Dine in Treatment: 2 Wound Status Wound Number: 7 Primary Pressure Ulcer Etiology: Wound Location: Left, Midline, Plantar Foot Wound Open Wounding Event: Gradually Appeared Status: Date Acquired: 08/07/2020 Comorbid Chronic sinus problems/congestion, Middle ear problems, Weeks Of Treatment: 2 History: Anemia, Chronic Obstructive Pulmonary Disease (COPD), Clustered Wound: No Congestive Heart Failure, Type II Diabetes, End Stage Renal Disease, History of pressure wounds, Neuropathy Photos Wound Measurements Length: (cm) 1.7 % Width: (cm) 2 % Depth: (cm) 1.1 Ep Area: (cm) 2.67 T Volume: (cm) 2.937 U Reduction in Area: 0% Reduction in Volume: -120% ithelialization: None unneling: No ndermining: Yes Starting Position (o'clock): 12 Ending Position (o'clock): 12 Maximum Distance: (cm) 1.5 Wound Description Classification: Category/Stage II F Wound Margin: Epibole S Exudate Amount: Medium Exudate Type: Serous Exudate Color: amber oul Odor After Cleansing: No lough/Fibrino Yes Wound Bed Granulation Amount: None Present (0%) Exposed Structure Necrotic Amount: Large (67-100%) Fascia Exposed: No Necrotic Quality: Adherent Slough Fat Layer (Subcutaneous Tissue) Exposed: Yes Tendon Exposed: No Muscle Exposed: No Joint Exposed: No Bone Exposed: No Treatment Notes Wound #7 (Foot) Wound  Laterality: Plantar, Left, Midline Kretsch, Nicola A. ([id number]) Cleanser Soap and Water Discharge Instruction: Gently  cleanse wound with antibacterial soap, rinse and pat dry prior to dressing wounds Peri-Wound Care Topical Primary Dressing Prisma 4.34 (in) Discharge Instruction: Moisten with hydrogel-tuck in undermining area Secondary Dressing Bordered Gauze Sterile-HBD 4x4 (in/in) Discharge Instruction: Cover wound with Bordered Guaze Sterile as directed Secured With Compression Wrap Compression Stockings Add-Ons Electronic Signature(s) Signed: 04/07/2021 3:34:38 PM By: Carlene Coria RN Entered By: Carlene Coria on 04/07/2021 13:01:48 Gallion, Ahmaya A. ([id number]) -------------------------------------------------------------------------------- Vitals Details Patient Name: Heather Dear A. Date of Service: 04/07/2021 12:45 PM Medical Record Number: [id number] Patient Account Number: [id number] Date of Birth/Sex: 1976-06-15 (45 y.o. F) Treating RN: Carlene Coria Primary Care Ryann Leavitt: Raelene Bott Other Clinician: Referring Johnnathan Hagemeister: Raelene Bott Treating Anwita Mencer/Extender: Tito Dine in Treatment: 2 Vital Signs Time Taken: 12:54 Temperature (F): 98.8 Height (in): 69 Pulse (bpm): 78 Weight (lbs): 185 Respiratory Rate (breaths/min): 18 Body Mass Index (BMI): 27.3 Blood Pressure (mmHg): 90/67 Capillary Blood Glucose (mg/dl): 179 Reference Range: 80 - 120 mg / dl Notes CBG per patient Electronic Signature(s) Signed: 04/07/2021 3:34:38 PM By: Carlene Coria RN Entered By: Carlene Coria on 04/07/2021 12:54:47

## 2021-04-08 NOTE — Progress Notes (Signed)
RAYSHELL, NIKOLIC ([id number]) Visit Report for 04/07/2021 Debridement Details Patient Name: [name] Date of Service: 04/07/2021 12:45 PM Medical Record Number: [id number] Patient Account Number: [id number] Date of Birth/Sex: 05-Jan-1976 (45 y.o. F) Treating RN: Cornell Barman Primary Care Provider: Raelene Bott Other Clinician: Referring Provider: Raelene Bott Treating Provider/Extender: Tito Dine in Treatment: 2 Debridement Performed for Wound #7 Left,Midline,Plantar Foot Assessment: Performed By: Physician Ricard [name], MD Debridement Type: Debridement Level of Consciousness (Pre- Awake and Alert procedure): Pre-procedure Verification/Time Out Yes - 13:19 Taken: Total Area Debrided (L x W): 1.7 (cm) x 2 (cm) = 3.4 (cm) Tissue and other material Viable, Non-Viable, Subcutaneous debrided: Level: Skin/Subcutaneous Tissue Debridement Description: Excisional Instrument: Curette Bleeding: Minimum Hemostasis Achieved: Pressure Response to Treatment: Procedure was tolerated well Level of Consciousness (Post- Awake and Alert procedure): Post Debridement Measurements of Total Wound Length: (cm) 1.7 Stage: Category/Stage II Width: (cm) 2 Depth: (cm) 1.1 Volume: (cm) 2.937 Character of Wound/Ulcer Post Debridement: Stable Post Procedure Diagnosis Same as Pre-procedure Electronic Signature(s) Signed: 04/07/2021 4:10:05 PM By: Linton Ham MD Signed: 04/07/2021 5:10:04 PM By: Gretta Cool, BSN, RN, CWS, [name] RN, BSN Entered By: Linton Ham on 04/07/2021 13:26:03 [place], [place] ([id number]) -------------------------------------------------------------------------------- HPI Details Patient Name: [name] Date of Service: 04/07/2021 12:45 PM Medical Record Number: [id number] Patient Account Number: [id number] Date of Birth/Sex: Feb 27, 1976 (45 y.o. F) Treating RN: Cornell Barman Primary Care Provider: Raelene Bott Other Clinician: Referring  Provider: Raelene Bott Treating Provider/Extender: Tito Dine in Treatment: 2 History of Present Illness HPI Description: 01/18/18-She is here for initial evaluation of the left great toe ulcer. She is [name] poor historian in regards to timeframe in detail. She states approximately 4 weeks ago she lacerated her toe on something in the house. She followed up with her primary care who placed her on Bactrim and ultimately [name] second dose of Bactrim prior to coming to wound clinic. She states she has been treating the toe with peroxide, Betadine and [name] Band-Aid. She did not check her blood sugar this morning but checked it yesterday morning it was 327; she is unaware of [name] recent A1c and there are no current records. She saw Dr. she would've orthopedics last week for an old injury to the left ankle, she states he did not see her toe, nor did she bring it to his attention. She smokes approximately 1 pack cigarettes [name] day. Her social situation is concerning, she arrives this morning with her mother who appears extremely intoxicated/under the influence; her mother was asked to leave the room and be monitored by the patient's grandmother. The patient's aunt then accompanied the patient and the room throughout the rest of the appointment. We had [name] lengthy discussion regarding the deleterious effects of uncontrolled hyperglycemia and smoking as it relates to wound healing and overall health. She was strongly encouraged to decrease her smoking and get her diabetes under better control. She states she is currently on [name] diet and has cut down her [facility] consumption. The left toe is erythematous, macerated and slightly edematous with malodor present. The edema in her left foot is below her baseline, there is no erythema streaking. We will treat her with Santyl, doxycycline; we have ordered and xray, culture and provided [name] Peg assist surgical shoe and cultured the wound. 01/25/18-She is here in follow-up  evaluation for [name] left great toe ulcer and presents with an abscess to her suprapubic area. She states her blood sugars remain elevated, feeling "  sick" and if levels are below 250, but she is trying. She has made no attempt to decrease her smoking stating that we "can't take away her food in her cigarettes". She has been compliant with offloading using the PEG assist you. She is using Santyl daily. the culture obtained last week grew staph aureus and Enterococcus faecalis; continues on the doxycycline and Augmentin was added on Monday. The suprapubic area has erythema, no femoral variation, purple discoloration, minimal induration, was accessed with [name] cotton tip applicator with sanguinopurulent drainage, this was cultured, I suspect the current antibiotic treatment will cover and we will not add anything to her current treatment plan. She was advised to go to urgent care or ER with any change in redness, induration or fever. 02/01/18-She is here in follow-up evaluation for left great toe ulcers and [name] new abdominal abscess from last week. She was able to use packing until earlier this week, where she "forgot it was there". She states she was feeling ill with GI symptoms last week and was not taking her antibiotic. She states her glucose levels have been predominantly less than 200, with occasional levels between 200-250. She thinks this was contributing to her GI symptoms as they have resolved without intervention. There continues to be significant laceration to left toe, otherwise it clinically looks stable/improved. There is now less superficial opening to the lateral aspect of the great toe that was residual blister. We will transition to [facility] to all wounds, she will continue her Augmentin. If there is no change or deterioration next week for reculture. 02/08/18-She is here in follow-up evaluation for left great toe ulcer and abdominal ulcer. There is an improvement in both wounds. She has  been wrapping her left toe with coban, not by our direction, which has created an area of discoloration to the medial aspect; she has been advised to NOT use coban secondary to her neuropathy. She states her glucose levels have been high over this last week ranging from 200-350, she continues to smoke. She admits to being less compliant with her offloading shoe. We will continue with same treatment plan and she will follow-up next week. 02/15/18-She is here in follow-up evaluation for left great toe ulcer and abdominal ulcer. The abdominal ulcer is epithelialized. The left great toe ulcer is improved and all injury from last week using the Coban wrap is resolved, the lateral ulcer is healed. She admits to noncompliance with wearing offloading shoe and admits to glucose levels being greater than 300 most of the week. She continues to smoke and expresses no desire to quit. There is one area medially that probes deeper than it has historically, erythema to the toe and dorsal foot has consistently waxed and waned. There is no overt signs of cellulitis or infection but we will culture the wound for any occult infection given the new area of depth and erythema. We will hold off on sensitivities for initiation of antibiotic therapy. 02/22/18-She is here in follow up evaluation for left great toe ulcer. There is overall significant improvement in both wound appearance, erythema and edema with changes made last week. She was not initiated on antibiotic therapy. Culture obtained last week showed oxacillin sensitive staph aureus, sensitive to clindamycin. Clindamycin has been called into the pharmacy but she has been instructed to hold off on initiation secondary to overall clinical improvement and her history of antibiotic intolerance. She has been instructed to contact the clinic with any noted changes/deterioration and the wound, erythema, edema and/or  pain. She will follow-up next week. She continues to smoke  and her glucose levels remain elevated >250; she admits to compliance with offloading shoe 03/01/18 on evaluation today patient appears to be doing fairly well in regard to her left first toe ulcer. She has been tolerating the dressing changes with the [facility] Dressing without complication and overall this has definitely showed signs of improvement according to records as well is what the patient tells me today. I'm very pleased in that regard. She is having no pain today 03/08/18 She is here for follow up evaluation of [name] left great toe ulcer. She remains non-compliant with glucose control and smoking cessation; glucose levels consistently >200. She states that she got new shoe inserts/peg assist. She admits to compliance with offloading. Since my last evaluation there is significant improvement. We will switch to prisma at this time and she will follow up next week. She is noted to be tachycardic at this appointment, heart rate 120s; she has [name] history of heart rate 70-130 according to our records. She admits to extreme agitation r/t personal issues; she was advised to monitor her heartrate and contact her physician if it does not return to [name] more normal range (<100). She takes cardizem twice daily. 03/15/18-She is here in follow-up evaluation for left great toe ulcer. She remains noncompliant with glucose control and smoking cessation. She admits to compliance with wearing offloading shoe. The ulcer is improved/stable and we will continue with the same treatment plan and she will follow-up next week 03/22/18-She is here for evaluation for left great toe ulcer. There continues to be significant improvement despite recurrent hyperglycemia (over 500 yesterday) and she continues to smoke. She has been compliant with offloading and we will continue with same treatment plan and she will follow-up next week. 03/29/18-She is here for evaluation for left great toe ulcer. Despite continuing to smoke and  uncontrolled diabetes she continues to improve. She is compliant with offloading shoe. We will continue with the same treatment plan and she will follow-up next week 04/05/18- She is here in follow up evaluation for [name] left great toe ulcer; she presents with small pustule to left fifth toe (resembles ant bite). She admits to compliance with wearing offloading shoe; continues to smoke or have uncontrolled blood glucose control. There is more callus than usual with evidence of bleeding; she denies known trauma. 04/12/18-She is here for evaluation of left great toe ulcer. Despite noncompliance with glycemic control and smoking she continues to make Lofgren, Anah [name]. ([id number]) improvement. She continues to wear offloading shoe. The pustule, that was identified last week, to the left fifth toe is resolved. She will follow-up in 2 weeks 05/03/18-she is seen in follow-up evaluation for [name] left great toe ulcer. She is compliant with offloading, otherwise noncompliant with glycemic control and smoking. She has plateaued and there is minimal improvement noted. We will transition to [facility], replaced the insert to her surgical shoe and she will follow-up in one week 05/10/18- She is here in follow up evaluation for [name] left great toe ulcer. It appears stable despite measurement change. We will continue with same treatment plan and follow up next week. 05/24/18-She is seen in follow-up evaluation for [name] left great toe ulcer. She remains compliant with offloading, has made significant improvement in her diet, decreasing the amount of sugar/soda. She said her recent A1c was 10.9 which is lower than. She did see [name] diabetic nutritionist/educator yesterday. She continues to smoke. We will continue with  the same treatment plan and she'll follow-up next week. 05/31/18- She is seen in follow-up evaluation for left great toe ulcer. She continues to remain compliant with offloading, continues to make improvement in her  diet, increasing her water and decreasing the amount of sugar/soda. She does continue to smoke with no desire to quit. We will apply Prisma to the depth and Hydrofera Blue over. We have not received insurance authorization for oasis. She will follow up next week. 06/07/18-She is seen in follow-up evaluation for left great toe ulcer. It has stalled according to today's measurements although base appears stable. She says she saw [name] diabetic educator yesterday; her average blood sugars are less than 300 which is an improvement for her. She continues to smoke and states "that's my next step" She continues with water over soda. We will order for xray, culture and reinstate ace wrap compression prior to placing apligraf for next week. She is voicing no complaints or concerns. Her dressing will change to iodoflex over the next week in preparation for apligraf. 06/14/18-She is seen in follow-up evaluation for left great toe ulcer. Plain film x-ray performed last week was negative for osteomyelitis. Wound culture obtained last week grew strep B and OSSA; she is initiated on keflex and cefdinir today; there is erythema to the toe which could be from ace wrap compression, she has [name] history of wrapping too tight and has has been encouraged to maintain ace wraps that we place today. We will hold off on application of apligraf today, will apply next week after antibiotic therapy has been initiated. She admits today that she has resumed taking [name] shower with her foot/toe submerged in water, she has been reminded to keep foot/toe out of the bath water. She will be seen in follow up next week 06/21/18-she is seen in follow-up evaluation for left great toe ulcer. She is tolerating antibiotic therapy with no GI disturbance. The wound is stable. Apligraf was applied today. She has been decreasing her smoking, only had 4 cigarettes yesterday and 1 today. She continues being more compliant in diabetic diet. She will follow-up  next week for evaluation of site, if stable will remove at 2 weeks. 06/28/18- She is here in follow up evalution. Apligraf was placed last week, she states the dressing fell off on Tuesday and she was dressing with hydrofera blue. She is healed and will be discharged from the clinic today. She has been instructed to continue with smoking cessation, continue monitoring glucose levels, offloading for an additional 4 weeks and continue with hydrofera blue for additional two weeks for any possible microscopic opening. Readmission: 08/07/18 on evaluation today patient presents for reevaluation concerning the ulcer of her right great toe. She was previously discharged on 06/28/18 healed. Nonetheless she states that this began to show signs of drainage she subsequently went to her primary care provider. Subsequently an x-ray was performed on 08/01/18 which was negative. The patient was also placed on antibiotics at that time. Fortunately they should have been effective for the infection. Nonetheless she's been experiencing some improvement but still has [name] lot of drainage coming from the wound itself. 08/14/18 on evaluation today patient's wound actually does show signs of improvement in regard to the erythema at this point. She has completed the antibiotics. With that being said we did discuss the possibility of placing her in [name] total contact cast as of today although I think that I may want to give this just [name] little bit more time to ensure  nothing recurrence as far as her infection is concerned. I do not want to put in the cast and risk infection at that time if things are not completely resolved. With that being said she is gonna require some debridement today. 08/21/18 on evaluation today patient actually appears to be doing okay in regard to her toe ulcer. She's been tolerating the dressing changes without complication. With that being said it does appear that she is ready and in fact I think it's  appropriate for [place] to go ahead and initiate the total contact cast today. Nonetheless she will require some sharp debridement to prepare the wound for application. Overall I feel like things have been progressing well but we do need to do something to get this to close more readily. 08/24/18 patient seen today for reevaluation after having had the total contact cast applied on Tuesday. She seems to have done very well the wound appears to be doing great and overall I'm pleased with the progress that she's made. There were no abnormal areas of rubbing from the cast on her lower extremity. 08/30/18 on evaluation today patient actually appears to be completely healed in regard to her plantar toe ulcer. She tells me at this point she's been having [name] lot of issues with the cast. She almost fell [name] couple of times the state shall the step of her dog [name] couple times as well. This is been [name] very frustrating process for her other nonetheless she has completely healed the wound which is excellent news. Overall there does not appear to be the evidence of infection at this time which is great news. 09/11/18 evaluation today patient presents for follow-up concerning her great toe ulcer on the left which has unfortunately reopened since I last saw her which was only [name] couple of weeks ago. Unfortunately she was not able to get in to get the shoe and potentially the AFO that's gonna be necessary due to her left foot drop. She continues with offloading shoe but this is not enough to prevent her from reopening it appears. When we last had her in the total contact cast she did well from [name] healing standpoint but unfortunately the wound reopened as soon as she came out of the cast within just [name] couple of weeks. Right now the biggest concern is that I do believe the foot drop is leading to the issue and this is gonna continue to be an issue unfortunately until we get things under control as far as the walking anomaly is  concerned with the foot drop. This is also part of the reason why she falls on [name] regular basis. I just do not believe that is gonna be safe for [place] to reinitiate the total contact cast as last time we had this on she fell 3 times one week which is definitely not normal for her. 09/18/18 upon evaluation today the patient actually appears to be doing about the same in regard to her toe ulcer. She did not contact [employer] as I asked her to even though I had given her the prescription. In fact she actually states that she has no idea where the prescription is. She did apparently call [employer] and they told her that all she needed to do was bring the prescription in order to be able to be seen and work on getting the AFO for her left foot. With all that being said she still does not have an appointment and I'm not sure were things stand that  regard. I will give her [name] new prescription today in order to contact them to get this set up. 09/25/18 on evaluation today patient actually appears to be doing about the same in regard to her toes ulcer. She does have [name] small areas which seems to have [name] lot of callous buildup around the edge of the wound which is going to need sharp debridement today. She still is waiting to be scheduled for evaluation with [employer] for possibility of an AFO. She states there supposed to call her tomorrow to get this set up. Unfortunately it does appear that her foot specifically the toe area is showing signs of erythema. There does not appear to be any systemic infection which is in these good news. CHELSIA, SWALLOWS [name]. ([id number]) 10/02/18 on evaluation today patient actually appears to be doing about the same in regard to her toe ulcer. This really has not done too well although it's not significantly larger it's also not significantly smaller. She has been tolerating the dressing changes without complication. She actually has her appointment with [facility] and [facility] tomorrow to hopefully  be measured for obtaining and AFO splint. I think this would be helpful preventing this from reoccurring. We had contemplated starting the cast this week although to be honest I am reluctant to do that as she's been having nausea, vomiting, and seizure activity over the past three days. She has [name] history of seizures and have been told is nothing that can be done for these. With that being said I do believe that along with the seizures have the nausea vomiting which upon further questioning doesn't seem to be the normal for her and makes me concerned for the possibility of infection or something else going on. I discussed this with the patient and her mother during the office visit today. I do not feel the wound is effective but maybe something else. The responses this was "this just happens to her at times and we don't know why". They did not seem to be interested in going to the hospital to have this checked out further. 10/09/18 on evaluation today patient presents for follow-up concerning her ongoing toe ulcer. She has been tolerating the dressing changes without complication. Fortunately there does not appear to be any evidence of infection which is great news however I do think that the patient would benefit from going ahead for with the total contact cast. She's actually in [name] wheelchair today she tells me that she will use her walker if we initiate the cast. I was very specific about the fact that if we were gonna do the cast I wanted to make sure that she was using the walker in order to prevent any falls. She tells me she does not have stairs that she has to traverse on [name] regular basis at her home. She has not had any seizures since last week again that something that happens to her often she tells me she did talk to [employer] and they said that it may take up to three weeks to get the brace approved for her. Hopefully that will not take that long but nonetheless in the meantime I do think the  cast could be of benefit. 10/12/18 on evaluation today patient appears to be doing rather well in regard to her toe ulcer. It's just been [name] few days and already this is significantly improved both as far as overall appearance and size. Fortunately there's no sign of infection. She is here for her first obligatory  cast change. 10/19/18 Seen today for follow up and management of left great toe ulcer. Wound continues to show improvement. Noted small open area with seroussang drainage with palpation. Denies any increased pain or recent fevers during visit. She will continue calcium alginate with offloading shoe. Denies any questions or concerns during visit. 10/26/18 on evaluation today patient appears to be doing about the same as when I last saw her in regard to her wound bed. Fortunately there does not appear to be any signs of infection. Unfortunately she continues to have [name] breakdown in regard to the toe region any time that she is not in the cast. It takes almost no time at all for this to happen. Nonetheless she still has not heard anything from the brace being made by [employer] as to when exactly this will be available to her. Fortunately there is no signs of infection at this time. 10/30/18 on evaluation today patient presents for application of the total contact cast as we just received him this morning. Fortunately we are gonna be able to apply this to her today which is great news. She continues to have no significant pain which is good news. Overall I do feel like things have been improving while she was the cast is when she doesn't have [name] cast that things get worse. She still has not really heard anything from [employer] regarding her brace. 11/02/18 upon evaluation today patient's wound already appears to be doing significantly better which is good news. Fortunately there does not appear to be any signs of infection also good news. Overall I do think the total contact cast as before is helping to  heal this area unfortunately it's just not gonna likely keep the area closed and healed without her getting her brace at least. Again the foot drop is [name] significant issue for her. 11/09/18 on evaluation today patient appears to be doing excellent in regard to her toe ulcer which in fact is completely healed. Fortunately we finally got the situation squared away with the paperwork which was needed to proceed with getting her brace approved by Medicaid. I have filled that out unfortunately that information has been sent to the orthopedic office that I worked at 2 1/2 years ago and not tired Current wound care measures. Fortunately she seems to be doing very well at this time. 11/23/18 on evaluation today patient appears to be doing More Poorly Compared to Last Time I Saw Her. At [facility] She Had Completely Healed. Currently she is continuing to have issues with reopening. She states that she just found out that the brace was approved through Medicaid now she just has to go get measured in order to have this fitted for her and then made. Subsequently she does not have an appointment for this yet that is going to complicate things we obviously cannot put her back in the cast if we do not have everything measured because they're not gonna be able to measure her foot while she is in the cast. 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 Obviously this is unfortunate and does have me somewhat worried as well. 11/30/18 on evaluation today patient's toe ulcer actually appears to be doing fairly well. The good news is she will be getting her brace in the shoes next week on Wednesday. Hopefully we will be able to get this to heal without having to  go back in the cast however she may need the cast in order to get the wound completely heal and then go from there. Fortunately there's no signs of infection at this  time. 12/07/18 on evaluation today patient fortunately did receive her brace and she states she could tell this definitely makes her walk better. With that being said she's been having issues with her toe where she noticed yesterday there was [name] lot of tissue that was loosing off this appears to be much larger than what it was previous. She also states that her leg has been read putting much across the top of her foot just about the ankle although this seems to be receiving somewhat. The total area is still red and appears to be someone infected as best I can tell. She is previously taken Bactrim and that may be [name] good option for her today as well. We are gonna see what I wound culture shows as well and I think that this is definitely appropriate. With that being said outside of the culture I still need to initiate something in the interim and that's what I'm gonna go ahead and select Bactrim is [name] good option for her. 12/14/18 on evaluation today patient appears to be doing better in regard to her left great toe ulcer as compared to last week's evaluation. There's still some erythema although this is significantly improved which is excellent news. Overall I do believe that she is making good progress is still gonna take some time before she is where I would like her to be from the standpoint of being able to place her back into the total contact cast. Hopefully we will be where we need to be by next week. 12/21/18 on evaluation today patient actually appears to be doing poorly in regard to her toe ulcer. She's been tolerating the dressing changes without complication. Fortunately there's no signs of systemic infection although she does have [name] lot of drainage from the toe ulcer and this does seem to be causing some issues at this point. She does have erythema on the distal portion of her toe that appears to be likely cellulitis. 12/28/18 on evaluation today patient actually appears to be doing [name] little  better in my pinion in regard to her toe ulcer. With that being said she still does have some evidence of infection at this time and for her culture she had both E. coli as well as enterococcus as organisms noted on evaluation. For that reason I think that though the Keflex likely has treated the E. coli rather well this has really done nothing for the enterococcus. We are going to have to initiate treatment for this specifically. DAVAYA, PENNYCUFF [name]. ([id number]) 01/04/19 on evaluation today patient's toe actually appears to be doing better from the standpoint of infection. She currently would like to see about putting the cash back on I think that this is appropriate as long as she takes care of it and keeps it from getting wet. She is gonna have some drainage we can definitely pass this up with Drawtex and alginate to try to prevent as much drainage as possible from causing the problems. With that being said I do want to at least try her with the cast between now and Tuesday. If there any issues we can't continue to use it then I will discontinue the use of the cast at that point. 01/08/19 on evaluation today patient actually appears to be doing very well as far as her  foot ulcer specifically the great toe on the left is concerned. She did have an area of rubbing on the medial aspect of her left ankle which again is from the cast. Fortunately there's no signs of infection at this point in this appears to be [name] very slight skin breakdown. The patient tells me she felt it rubbing but didn't think it was that bad. Fortunately there is no signs of active infection at this time which is good news. No fevers, chills, nausea, or vomiting noted at this time. 01/15/19 on evaluation today patient actually appears to be doing well in regard to her toe ulcer. Again as previous she seems to do well and she has the cast on which indicates to me that during the time she doesn't have [name] cast on she's putting way too much  pressure on this region. Obviously I think that's gonna be an issue as with the current national emergency concerning the Covid-19 Virus it has been recommended that we discontinue the use of total contact casting by the chief medical officer of our company, Dr. Simona Huh. The reasoning is that if [name] patient becomes sick and cannot come into have the cast removed they could not just leave this on for an additional two weeks. Obviously the hospitals also do not want to receive patient's who are sick into the emergency department to potentially contaminate the region and spread the Covid-19 Virus among other sick individuals within the hospital system. Therefore at this point we are suspending the use of total contact cast until the current emergency subsides. This was all discussed with the patient today as well. 01/22/19 on evaluation today patient's wound on her left great toe appears to be doing slightly worse than previously noted last week. She tells me that she has been on this quite [name] bit in fact she tells me she's been awake for 38 straight hours. This is due to the fact that she's having to care for grandparents because nobody else will. She has been taking care of them for five the last seven days since I've seen her they both have dementia his is from [name] stroke and her grandmother's was progressive. Nonetheless she states even her mom who knows her condition and situation has only help two of those days to take care of them she's been taking care of the rest. Fortunately there does not appear to be any signs of active infection in regard to her toe at this point although obviously it doesn't look as good as it did previous. I think this is directly related to her not taking off the pressure and friction by way of taking things easy. Though I completely understand what's going on. 01/29/19 on evaluation today patient's tools are actually appears to be showing some signs of improvement today compared  to last week's evaluation as far as not necessarily the overall size of the wound but the fact that she has some new skin growth in between the two ends of the wound opening. Overall I feel like she has done well she states that she had [name] family member give her what sounds to be [name] CAM walker boot which has been helpful as well. 02/05/19 on evaluation today patient's wound bed actually appears to be doing significantly better in regard to her overall appearance of the size of the wound. With that being said she is still having an issue with offloading efficiently enough to get this to close. Apparently there is some signs of infection  at this point as well unfortunately. Previously she's done well of Augmentin I really do not see anything that needs to be culture currently but there theme and cellulitis of the foot that I'm seeing I'm gonna go ahead and place her on an antibiotic today to try to help clear this up. 02/12/2019 on evaluation today patient actually appears to be doing poorly in regard to her overall wound status. She tells me she has been using her offloading shoe but actually comes in today wearing her tennis shoe with the AFO brace. Again as I previously discussed with her this is really not sufficient to allow the area to heal appropriately. Nonetheless she continues to be somewhat noncompliant and I do wonder based on what she has told my nurse in the past as to whether or not [name] good portion of this noncompliance may be recreational drug and alcohol related. She has had [name] history of heroin overdose and this was fairly recently in the past couple of months that have been seeing her. Nonetheless overall I feel like her wound looks significantly worse today compared to what it was previous. She still has significant erythema despite the Augmentin I am not sure that this is an appropriate medication for her infection I am also concerned that the infection may have gone down into her  bone. 02/19/19 on evaluation today patient actually appears to be doing about the same in regard to her toe ulcer. Unfortunately she continues to show signs of bone exposure and infection at this point. There does not appear to be any evidence of worsening of the infection but I'm also not really sure that it's getting significantly better. She is on the Augmentin which should be sufficient for the Staphylococcus aureus infection that she has at this point. With that being said she may need IV antibiotics to more appropriately treat this. We did have [name] discussion today about hyperbaric option therapy. 02/28/19 on evaluation today patient actually appears to be doing much worse in regard to the wound on her left great toe as compared to even my previous evaluation last week. Unfortunately this seems to be training in [name] pretty poor direction. Her toe was actually now starting to angle laterally and I can actually see the entire joint area of the proximal portion of the digit where is the distal portion of the digit again is no longer even in contact with the joint line. Unfortunately there's [name] lot more necrotic tissue around the edge and the toe appears to be showing signs of becoming gangrenous in my pinion. I'm very concerned about were things stand at this point. She did see infectious disease and they are planning to send in [name] prescription for Sivextro for her and apparently this has been approved. With that being said I don't think she should avoid taking this but at the same time I'm not sure that it's gonna be sufficient to save her toe at this point. She tells me that she still having to care for grandparents which I think is putting quite [name] bit of strain on her foot and specifically the total area and has caused this to break down even to [name] greater degree than would've otherwise been expected. 03/05/19 on evaluation today patient actually appears to be doing quite well in regard to her toe all  things considering. She still has bone exposed but there appears to be much less your thing on overall the appearance of the wound and the toe itself is  dramatically improved. She still does have some issues currently obviously with infection she did see vascular as well and there concerned that her blood flow to the toad. For that reason they are setting up for an angiogram next week. 03/14/19 on evaluation today patient appears to be doing very poor in regard to her toe and specifically in regard to the ulceration and the fact that she's starting to notice the toe was leaning even more towards the lateral aspect and the complete joint is visible on the proximal aspect of the joint. Nonetheless she's also noted [name] significant odor and the tip of the toe is turning more dark and necrotic appearing. Overall I think she is getting worse not better as far as this is concerned. For that reason I am recommending at this point that she likely needs to be seen for likely amputation. READMISSION 03/19/2021 This is [name] patient that we cared for in this clinic for [name] prolonged period of time in 2019 and 2020 with [name] left foot and left first toe wound. 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 ([id number]) 04/09/20 by Dr. Luana Shu. In December 2021 she had an ulcer on her right great toe as well as the fourth and fifth toes. 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 She also had an angiogram at that time and underwent angioplasty of the right anterior tibial artery. In any case she claims that the wound on the right foot is closed I did not look at this today which was probably an oversight although I think that should be done next week. 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 According to Dr. Deborra Medina last review that  she was out of the area being cared for by another physician but recently came back to his attention. 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 [name] culture of this area showed E. coli apparently before she came back to see Dr. Luana Shu she was supposed to be receiving antibiotics but she did not really take them. Nor is she offloading this area at all. Finally her last hemoglobin A1c listed in epic was in March 2022 at 14.1 she says things are [name] lot better since then although I am not sure. 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 She had this wound at the time. During this admission she had E. coli sepsis [name] TEE was negative. 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 She is still [name] heavy cigarette smoker. She has [name] history of bipolar disorder seizure disorder ABI in our clinic was 1.05 6/1; left midfoot in the setting of [name] TMA done previously. Round circular wound with [name] "knuckle" of protruding tissue. The problem is that the knuckle was not attached to any of the surrounding granulation and this probed proximally widely I removed [name] large portion of this tissue. This wound goes with considerable undermining laterally. I do not feel any bone there was no purulence but this is [name] deep wound. 6/8; in spite of the debridement I did last  week. She arrives with [name] wound looking exactly the same. [name] protruding "knuckle" of tissue nonadherent to most of the surrounding tissue. There is considerable depth around this from 6-12 o'clock at 2.7 cm and undermining of 1 cm. This does not look overtly infected and the x-ray I did last week was negative for any osseous  abnormalities. We have been using silver collagen 6/15; deep tissue culture I did last week showed moderate staph aureus and moderate Pseudomonas. This will definitely require prolonged antibiotic therapy. The pathology on the protuberant area was negative for malignancy fungus etc. 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 We have been using silver collagen. I am going to be prescribing Levaquin for 2 weeks. Her CT scan of the foot is down for 7/5 Electronic Signature(s) Signed: 04/07/2021 4:10:05 PM By: Linton Ham MD Entered By: Linton Ham on 04/07/2021 13:28:37 [name] ([id number]) -------------------------------------------------------------------------------- Physical Exam Details Patient Name: [name] Date of Service: 04/07/2021 12:45 PM Medical Record Number: [id number] Patient Account Number: [id number] Date of Birth/Sex: [birth year] (45 y.o. F) Treating RN: Cornell Barman Primary Care Provider: Raelene Bott Other Clinician: Referring Provider: Raelene Bott Treating Provider/Extender: Tito Dine in Treatment: 2 Constitutional Patient is hypotensive. However appears stable and well. Pulse regular and within target range for patient.Marland Kitchen Respirations regular, non-labored and within target range.. Temperature is normal and within the target range for the patient.Marland Kitchen appears in no distress. Notes Wound exam; somewhat better looking this week. Still [name] wound with the center part has deep probing depth from about 11-1 o'clock. This does not go to bone however. There is no purulent drainage. Using [name] #5 curette extensive debridement once again Electronic Signature(s) Signed: 04/07/2021 4:10:05 PM By: Linton Ham MD Entered By: Linton Ham on 04/07/2021 13:28:06 [name] ([id number]) -------------------------------------------------------------------------------- Physician Orders  Details Patient Name: [name] Date of Service: 04/07/2021 12:45 PM Medical Record Number: [id number] Patient Account Number: [id number] Date of Birth/Sex: 1976-01-21 (45 y.o. F) Treating RN: Cornell Barman Primary Care Provider: Raelene Bott Other Clinician: Referring Provider: Raelene Bott Treating Provider/Extender: Tito Dine in Treatment: 2 Verbal / Phone Orders: No Diagnosis Coding Follow-up Appointments o Return Appointment in 1 week. Bathing/ Shower/ Hygiene o Clean wound with Normal Saline or wound cleanser. Off-Loading Left Lower Extremity o Open toe surgical shoe with peg assist. Medications-Please add to medication list. o P.O. Antibiotics - Levaquin Wound Treatment Wound #7 - Foot Wound Laterality: Plantar, Left, Midline Cleanser: Soap and Water 3 x Per Week/30 Days Discharge Instructions: Gently cleanse wound with antibacterial soap, rinse and pat dry prior to dressing wounds Primary Dressing: Prisma 4.34 (in) 3 x Per Week/30 Days Discharge Instructions: Moisten with hydrogel-tuck in undermining area Secondary Dressing: Bordered Gauze Sterile-HBD 4x4 (in/in) 3 x Per Week/30 Days Discharge Instructions: Cover wound with Bordered Guaze Sterile as directed Patient Medications Allergies: erythromycin base Notifications Medication Indication Start End cefdinir wound infection 04/07/2021 DOSE oral 300 mg capsule - capsule oral clindamycin HCl wound infection 04/07/2021 DOSE oral 300 mg capsule - 1 capsule oral tid for 14days Notes CT Scan April 27, 2021 @ 9:15am Electronic Signature(s) Signed: 04/07/2021 1:35:18 PM By: Linton Ham MD Entered By: Linton Ham on 04/07/2021 13:35:18 Bogacki, Toney AMarland Kitchen ([id number]) -------------------------------------------------------------------------------- Problem List Details Patient Name: [name] Date of Service: 04/07/2021 12:45 PM Medical Record Number: [id number] Patient Account Number:  [id number] Date of Birth/Sex: 1976/03/23 (45 y.o. F) Treating RN: Cornell Barman  Primary Care Provider: Raelene Bott Other Clinician: Referring Provider: Raelene Bott Treating Provider/Extender: Tito Dine in Treatment: 2 Active Problems ICD-10 Encounter Code Description Active Date MDM Diagnosis E11.621 Type 2 diabetes mellitus with foot ulcer 03/19/2021 No Yes 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 03/19/2021 No Yes severity E11.42 Type 2 diabetes mellitus with diabetic polyneuropathy 03/19/2021 No Yes Inactive Problems Resolved Problems Electronic Signature(s) Signed: 04/07/2021 4:10:05 PM By: Linton Ham MD Entered By: Linton Ham on 04/07/2021 13:25:42 Glassburn, Xara [name]. ([id number]) -------------------------------------------------------------------------------- Progress Note Details Patient Name: [name] Date of Service: 04/07/2021 12:45 PM Medical Record Number: [id number] Patient Account Number: [id number] Date of Birth/Sex: [birth year] (45 y.o. F) Treating RN: Cornell Barman Primary Care Provider: Raelene Bott Other Clinician: Referring Provider: Raelene Bott Treating Provider/Extender: Tito Dine in Treatment: 2 Subjective History of Present Illness (HPI) 01/18/18-She is here for initial evaluation of the left great toe ulcer. She is [name] poor historian in regards to timeframe in detail. She states approximately 4 weeks ago she lacerated her toe on something in the house. She followed up with her primary care who placed her on Bactrim and ultimately [name] second dose of Bactrim prior to coming to wound clinic. She states she has been treating the toe with peroxide, Betadine and [name] Band-Aid. She did not check her blood sugar this morning but checked it yesterday morning it was 327; she is unaware of [name] recent A1c and there are no current records. She saw Dr. she would've orthopedics last week for an old  injury to the left ankle, she states he did not see her toe, nor did she bring it to his attention. She smokes approximately 1 pack cigarettes [name] day. Her social situation is concerning, she arrives this morning with her mother who appears extremely intoxicated/under the influence; her mother was asked to leave the room and be monitored by the patient's grandmother. The patient's aunt then accompanied the patient and the room throughout the rest of the appointment. We had [name] lengthy discussion regarding the deleterious effects of uncontrolled hyperglycemia and smoking as it relates to wound healing and overall health. She was strongly encouraged to decrease her smoking and get her diabetes under better control. She states she is currently on [name] diet and has cut down her [facility] consumption. The left toe is erythematous, macerated and slightly edematous with malodor present. The edema in her left foot is below her baseline, there is no erythema streaking. We will treat her with Santyl, doxycycline; we have ordered and xray, culture and provided [name] Peg assist surgical shoe and cultured the wound. 01/25/18-She is here in follow-up evaluation for [name] left great toe ulcer and presents with an abscess to her suprapubic area. She states her blood sugars remain elevated, feeling "sick" and if levels are below 250, but she is trying. She has made no attempt to decrease her smoking stating that we "can't take away her food in her cigarettes". She has been compliant with offloading using the PEG assist you. She is using Santyl daily. the culture obtained last week grew staph aureus and Enterococcus faecalis; continues on the doxycycline and Augmentin was added on Monday. The suprapubic area has erythema, no femoral variation, purple discoloration, minimal induration, was accessed with [name] cotton tip applicator with sanguinopurulent drainage, this was cultured, I suspect the current antibiotic treatment will cover and  we will not add anything to her current treatment plan. She was  advised to go to urgent care or ER with any change in redness, induration or fever. 02/01/18-She is here in follow-up evaluation for left great toe ulcers and [name] new abdominal abscess from last week. She was able to use packing until earlier this week, where she "forgot it was there". She states she was feeling ill with GI symptoms last week and was not taking her antibiotic. She states her glucose levels have been predominantly less than 200, with occasional levels between 200-250. She thinks this was contributing to her GI symptoms as they have resolved without intervention. There continues to be significant laceration to left toe, otherwise it clinically looks stable/improved. There is now less superficial opening to the lateral aspect of the great toe that was residual blister. We will transition to [facility] to all wounds, she will continue her Augmentin. If there is no change or deterioration next week for reculture. 02/08/18-She is here in follow-up evaluation for left great toe ulcer and abdominal ulcer. There is an improvement in both wounds. She has been wrapping her left toe with coban, not by our direction, which has created an area of discoloration to the medial aspect; she has been advised to NOT use coban secondary to her neuropathy. She states her glucose levels have been high over this last week ranging from 200-350, she continues to smoke. She admits to being less compliant with her offloading shoe. We will continue with same treatment plan and she will follow-up next week. 02/15/18-She is here in follow-up evaluation for left great toe ulcer and abdominal ulcer. The abdominal ulcer is epithelialized. The left great toe ulcer is improved and all injury from last week using the Coban wrap is resolved, the lateral ulcer is healed. She admits to noncompliance with wearing offloading shoe and admits to glucose levels being  greater than 300 most of the week. She continues to smoke and expresses no desire to quit. There is one area medially that probes deeper than it has historically, erythema to the toe and dorsal foot has consistently waxed and waned. There is no overt signs of cellulitis or infection but we will culture the wound for any occult infection given the new area of depth and erythema. We will hold off on sensitivities for initiation of antibiotic therapy. 02/22/18-She is here in follow up evaluation for left great toe ulcer. There is overall significant improvement in both wound appearance, erythema and edema with changes made last week. She was not initiated on antibiotic therapy. Culture obtained last week showed oxacillin sensitive staph aureus, sensitive to clindamycin. Clindamycin has been called into the pharmacy but she has been instructed to hold off on initiation secondary to overall clinical improvement and her history of antibiotic intolerance. She has been instructed to contact the clinic with any noted changes/deterioration and the wound, erythema, edema and/or pain. She will follow-up next week. She continues to smoke and her glucose levels remain elevated >250; she admits to compliance with offloading shoe 03/01/18 on evaluation today patient appears to be doing fairly well in regard to her left first toe ulcer. She has been tolerating the dressing changes with the [facility] Dressing without complication and overall this has definitely showed signs of improvement according to records as well is what the patient tells me today. I'm very pleased in that regard. She is having no pain today 03/08/18 She is here for follow up evaluation of [name] left great toe ulcer. She remains non-compliant with glucose control and smoking cessation; glucose  levels consistently >200. She states that she got new shoe inserts/peg assist. She admits to compliance with offloading. Since my last evaluation there is  significant improvement. We will switch to prisma at this time and she will follow up next week. She is noted to be tachycardic at this appointment, heart rate 120s; she has [name] history of heart rate 70-130 according to our records. She admits to extreme agitation r/t personal issues; she was advised to monitor her heartrate and contact her physician if it does not return to [name] more normal range (<100). She takes cardizem twice daily. 03/15/18-She is here in follow-up evaluation for left great toe ulcer. She remains noncompliant with glucose control and smoking cessation. She admits to compliance with wearing offloading shoe. The ulcer is improved/stable and we will continue with the same treatment plan and she will follow-up next week 03/22/18-She is here for evaluation for left great toe ulcer. There continues to be significant improvement despite recurrent hyperglycemia (over 500 yesterday) and she continues to smoke. She has been compliant with offloading and we will continue with same treatment plan and she will follow-up next week. 03/29/18-She is here for evaluation for left great toe ulcer. Despite continuing to smoke and uncontrolled diabetes she continues to improve. She is compliant with offloading shoe. We will continue with the same treatment plan and she will follow-up next week 04/05/18- She is here in follow up evaluation for [name] left great toe ulcer; she presents with small pustule to left fifth toe (resembles ant bite). She admits to compliance with wearing offloading shoe; continues to smoke or have uncontrolled blood glucose control. There is more callus than usual with evidence of bleeding; she denies known trauma. 04/12/18-She is here for evaluation of left great toe ulcer. Despite noncompliance with glycemic control and smoking she continues to make Eide, Lovell [name]. ([id number]) improvement. She continues to wear offloading shoe. The pustule, that was identified last week, to the left  fifth toe is resolved. She will follow-up in 2 weeks 05/03/18-she is seen in follow-up evaluation for [name] left great toe ulcer. She is compliant with offloading, otherwise noncompliant with glycemic control and smoking. She has plateaued and there is minimal improvement noted. We will transition to [facility], replaced the insert to her surgical shoe and she will follow-up in one week 05/10/18- She is here in follow up evaluation for [name] left great toe ulcer. It appears stable despite measurement change. We will continue with same treatment plan and follow up next week. 05/24/18-She is seen in follow-up evaluation for [name] left great toe ulcer. She remains compliant with offloading, has made significant improvement in her diet, decreasing the amount of sugar/soda. She said her recent A1c was 10.9 which is lower than. She did see [name] diabetic nutritionist/educator yesterday. She continues to smoke. We will continue with the same treatment plan and she'll follow-up next week. 05/31/18- She is seen in follow-up evaluation for left great toe ulcer. She continues to remain compliant with offloading, continues to make improvement in her diet, increasing her water and decreasing the amount of sugar/soda. She does continue to smoke with no desire to quit. We will apply Prisma to the depth and Hydrofera Blue over. We have not received insurance authorization for oasis. She will follow up next week. 06/07/18-She is seen in follow-up evaluation for left great toe ulcer. It has stalled according to today's measurements although base appears stable. She says she saw [name] diabetic educator yesterday; her average blood sugars are  less than 300 which is an improvement for her. She continues to smoke and states "that's my next step" She continues with water over soda. We will order for xray, culture and reinstate ace wrap compression prior to placing apligraf for next week. She is voicing no complaints or concerns. Her dressing  will change to iodoflex over the next week in preparation for apligraf. 06/14/18-She is seen in follow-up evaluation for left great toe ulcer. Plain film x-ray performed last week was negative for osteomyelitis. Wound culture obtained last week grew strep B and OSSA; she is initiated on keflex and cefdinir today; there is erythema to the toe which could be from ace wrap compression, she has [name] history of wrapping too tight and has has been encouraged to maintain ace wraps that we place today. We will hold off on application of apligraf today, will apply next week after antibiotic therapy has been initiated. She admits today that she has resumed taking [name] shower with her foot/toe submerged in water, she has been reminded to keep foot/toe out of the bath water. She will be seen in follow up next week 06/21/18-she is seen in follow-up evaluation for left great toe ulcer. She is tolerating antibiotic therapy with no GI disturbance. The wound is stable. Apligraf was applied today. She has been decreasing her smoking, only had 4 cigarettes yesterday and 1 today. She continues being more compliant in diabetic diet. She will follow-up next week for evaluation of site, if stable will remove at 2 weeks. 06/28/18- She is here in follow up evalution. Apligraf was placed last week, she states the dressing fell off on Tuesday and she was dressing with hydrofera blue. She is healed and will be discharged from the clinic today. She has been instructed to continue with smoking cessation, continue monitoring glucose levels, offloading for an additional 4 weeks and continue with hydrofera blue for additional two weeks for any possible microscopic opening. Readmission: 08/07/18 on evaluation today patient presents for reevaluation concerning the ulcer of her right great toe. She was previously discharged on 06/28/18 healed. Nonetheless she states that this began to show signs of drainage she subsequently went to her primary  care provider. Subsequently an x-ray was performed on 08/01/18 which was negative. The patient was also placed on antibiotics at that time. Fortunately they should have been effective for the infection. Nonetheless she's been experiencing some improvement but still has [name] lot of drainage coming from the wound itself. 08/14/18 on evaluation today patient's wound actually does show signs of improvement in regard to the erythema at this point. She has completed the antibiotics. With that being said we did discuss the possibility of placing her in [name] total contact cast as of today although I think that I may want to give this just [name] little bit more time to ensure nothing recurrence as far as her infection is concerned. I do not want to put in the cast and risk infection at that time if things are not completely resolved. With that being said she is gonna require some debridement today. 08/21/18 on evaluation today patient actually appears to be doing okay in regard to her toe ulcer. She's been tolerating the dressing changes without complication. With that being said it does appear that she is ready and in fact I think it's appropriate for [place] to go ahead and initiate the total contact cast today. Nonetheless she will require some sharp debridement to prepare the wound for application. Overall I feel like things  have been progressing well but we do need to do something to get this to close more readily. 08/24/18 patient seen today for reevaluation after having had the total contact cast applied on Tuesday. She seems to have done very well the wound appears to be doing great and overall I'm pleased with the progress that she's made. There were no abnormal areas of rubbing from the cast on her lower extremity. 08/30/18 on evaluation today patient actually appears to be completely healed in regard to her plantar toe ulcer. She tells me at this point she's been having [name] lot of issues with the cast. She almost fell  [name] couple of times the state shall the step of her dog [name] couple times as well. This is been [name] very frustrating process for her other nonetheless she has completely healed the wound which is excellent news. Overall there does not appear to be the evidence of infection at this time which is great news. 09/11/18 evaluation today patient presents for follow-up concerning her great toe ulcer on the left which has unfortunately reopened since I last saw her which was only [name] couple of weeks ago. Unfortunately she was not able to get in to get the shoe and potentially the AFO that's gonna be necessary due to her left foot drop. She continues with offloading shoe but this is not enough to prevent her from reopening it appears. When we last had her in the total contact cast she did well from [name] healing standpoint but unfortunately the wound reopened as soon as she came out of the cast within just [name] couple of weeks. 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 This is also part of the reason why she falls on [name] regular basis. I just do not believe that is gonna be safe for [place] to reinitiate the total contact cast as last time we had this on she fell 3 times one week which is definitely not normal for her. 09/18/18 upon evaluation today the patient actually appears to be doing about the same in regard to her toe ulcer. She did not contact [employer] as I asked her to even though I had given her the prescription. In fact she actually states that she has no idea where the prescription is. She did apparently call [employer] and they told her that all she needed to do was bring the prescription in order to be able to be seen and work on getting the AFO for her left foot. With all that being said she still does not have an appointment  and I'm not sure were things stand that regard. I will give her [name] new prescription today in order to contact them to get this set up. 09/25/18 on evaluation today patient actually appears to be doing about the same in regard to her toes ulcer. She does have [name] small areas which seems to have [name] lot of callous buildup around the edge of the wound which is going to need sharp debridement today. She still is waiting to be scheduled for evaluation with [employer] for possibility of an AFO. She states there supposed to call her tomorrow to get this set up. Unfortunately it does appear that her foot specifically the toe area is showing signs of erythema. There does  not appear to be any systemic infection which is in these good news. TAVARA, ZEEMAN [name]. ([id number]) 10/02/18 on evaluation today patient actually appears to be doing about the same in regard to her toe ulcer. This really has not done too well although it's not significantly larger it's also not significantly smaller. She has been tolerating the dressing changes without complication. She actually has her appointment with [facility] and [place] tomorrow to hopefully be measured for obtaining and AFO splint. I think this would be helpful preventing this from reoccurring. We had contemplated starting the cast this week although to be honest I am reluctant to do that as she's been having nausea, vomiting, and seizure activity over the past three days. She has [name] history of seizures and have been told is nothing that can be done for these. With that being said I do believe that along with the seizures have the nausea vomiting which upon further questioning doesn't seem to be the normal for her and makes me concerned for the possibility of infection or something else going on. I discussed this with the patient and her mother during the office visit today. I do not feel the wound is effective but maybe something else. The responses this was "this just happens to  her at times and we don't know why". They did not seem to be interested in going to the hospital to have this checked out further. 10/09/18 on evaluation today patient presents for follow-up concerning her ongoing toe ulcer. She has been tolerating the dressing changes without complication. Fortunately there does not appear to be any evidence of infection which is great news however I do think that the patient would benefit from going ahead for with the total contact cast. She's actually in [name] wheelchair today she tells me that she will use her walker if we initiate the cast. I was very specific about the fact that if we were gonna do the cast I wanted to make sure that she was using the walker in order to prevent any falls. She tells me she does not have stairs that she has to traverse on [name] regular basis at her home. She has not had any seizures since last week again that something that happens to her often she tells me she did talk to [employer] and they said that it may take up to three weeks to get the brace approved for her. Hopefully that will not take that long but nonetheless in the meantime I do think the cast could be of benefit. 10/12/18 on evaluation today patient appears to be doing rather well in regard to her toe ulcer. It's just been [name] few days and already this is significantly improved both as far as overall appearance and size. Fortunately there's no sign of infection. She is here for her first obligatory cast change. 10/19/18 Seen today for follow up and management of left great toe ulcer. Wound continues to show improvement. Noted small open area with seroussang drainage with palpation. Denies any increased pain or recent fevers during visit. She will continue calcium alginate with offloading shoe. Denies any questions or concerns during visit. 10/26/18 on evaluation today patient appears to be doing about the same as when I last saw her in regard to her wound bed. Fortunately there  does not appear to be any signs of infection. Unfortunately she continues to have [name] breakdown in regard to the toe region any time that she is not in the cast. It takes almost no  time at all for this to happen. Nonetheless she still has not heard anything from the brace being made by [employer] as to when exactly this will be available to her. Fortunately there is no signs of infection at this time. 10/30/18 on evaluation today patient presents for application of the total contact cast as we just received him this morning. Fortunately we are gonna be able to apply this to her today which is great news. She continues to have no significant pain which is good news. Overall I do feel like things have been improving while she was the cast is when she doesn't have [name] cast that things get worse. She still has not really heard anything from [place] regarding her brace. 11/02/18 upon evaluation today patient's wound already appears to be doing significantly better which is good news. Fortunately there does not appear to be any signs of infection also good news. Overall I do think the total contact cast as before is helping to heal this area unfortunately it's just not gonna likely keep the area closed and healed without her getting her brace at least. Again the foot drop is [name] significant issue for her. 11/09/18 on evaluation today patient appears to be doing excellent in regard to her toe ulcer which in fact is completely healed. Fortunately we finally got the situation squared away with the paperwork which was needed to proceed with getting her brace approved by Medicaid. I have filled that out unfortunately that information has been sent to the orthopedic office that I worked at 2 1/2 years ago and not tired Current wound care measures. Fortunately she seems to be doing very well at this time. 11/23/18 on evaluation today patient appears to be doing More Poorly Compared to Last Time I Saw Her. At [facility] She Had  Completely Healed. Currently she is continuing to have issues with reopening. She states that she just found out that the brace was approved through Medicaid now she just has to go get measured in order to have this fitted for her and then made. Subsequently she does not have an appointment for this yet that is going to complicate things we obviously cannot put her back in the cast if we do not have everything measured because they're not gonna be able to measure her foot while she is in the cast. 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 Obviously this is unfortunate and does have me somewhat worried as well. 11/30/18 on evaluation today patient's toe ulcer actually appears to be doing fairly well. The good news is she will be getting her brace in the shoes next week on Wednesday. Hopefully we will be able to get this to heal without having to go back in the cast however she may need the cast in order to get the wound completely heal and then go from there. Fortunately there's no signs of infection at this time. 12/07/18 on evaluation today patient fortunately did receive her brace and she states she could tell this definitely makes her walk better. With that being said she's been having issues with her toe where she noticed yesterday there was [name] lot of tissue that was loosing off this appears to be much larger than what it was previous. She also states that her leg has been read putting much across the top of her foot just about the ankle although this  seems to be receiving somewhat. The total area is still red and appears to be someone infected as best I can tell. She is previously taken Bactrim and that may be [name] good option for her today as well. We are gonna see what I wound culture shows as well and I think that this is definitely appropriate. With that being said outside of the culture I still need  to initiate something in the interim and that's what I'm gonna go ahead and select Bactrim is [name] good option for her. 12/14/18 on evaluation today patient appears to be doing better in regard to her left great toe ulcer as compared to last week's evaluation. There's still some erythema although this is significantly improved which is excellent news. Overall I do believe that she is making good progress is still gonna take some time before she is where I would like her to be from the standpoint of being able to place her back into the total contact cast. Hopefully we will be where we need to be by next week. 12/21/18 on evaluation today patient actually appears to be doing poorly in regard to her toe ulcer. She's been tolerating the dressing changes without complication. Fortunately there's no signs of systemic infection although she does have [name] lot of drainage from the toe ulcer and this does seem to be causing some issues at this point. She does have erythema on the distal portion of her toe that appears to be likely cellulitis. 12/28/18 on evaluation today patient actually appears to be doing [name] little better in my pinion in regard to her toe ulcer. With that being said she still does have some evidence of infection at this time and for her culture she had both E. coli as well as enterococcus as organisms noted on evaluation. For that reason I think that though the Keflex likely has treated the E. coli rather well this has really done nothing for the enterococcus. We are going to have to initiate treatment for this specifically. DERI, DEGUIRE [name]. ([id number]) 01/04/19 on evaluation today patient's toe actually appears to be doing better from the standpoint of infection. She currently would like to see about putting the cash back on I think that this is appropriate as long as she takes care of it and keeps it from getting wet. She is gonna have some drainage we can definitely pass this up with Drawtex and  alginate to try to prevent as much drainage as possible from causing the problems. With that being said I do want to at least try her with the cast between now and Tuesday. If there any issues we can't continue to use it then I will discontinue the use of the cast at that point. 01/08/19 on evaluation today patient actually appears to be doing very well as far as her foot ulcer specifically the great toe on the left is concerned. She did have an area of rubbing on the medial aspect of her left ankle which again is from the cast. Fortunately there's no signs of infection at this point in this appears to be [name] very slight skin breakdown. The patient tells me she felt it rubbing but didn't think it was that bad. Fortunately there is no signs of active infection at this time which is good news. No fevers, chills, nausea, or vomiting noted at this time. 01/15/19 on evaluation today patient actually appears to be doing well in regard to her toe ulcer. Again as previous she seems to  do well and she has the cast on which indicates to me that during the time she doesn't have [name] cast on she's putting way too much pressure on this region. Obviously I think that's gonna be an issue as with the current national emergency concerning the Covid-19 Virus it has been recommended that we discontinue the use of total contact casting by the chief medical officer of our company, Dr. Simona Huh. The reasoning is that if [name] patient becomes sick and cannot come into have the cast removed they could not just leave this on for an additional two weeks. Obviously the hospitals also do not want to receive patient's who are sick into the emergency department to potentially contaminate the region and spread the Covid-19 Virus among other sick individuals within the hospital system. Therefore at this point we are suspending the use of total contact cast until the current emergency subsides. This was all discussed with the patient today as  well. 01/22/19 on evaluation today patient's wound on her left great toe appears to be doing slightly worse than previously noted last week. She tells me that she has been on this quite [name] bit in fact she tells me she's been awake for 38 straight hours. This is due to the fact that she's having to care for grandparents because nobody else will. She has been taking care of them for five the last seven days since I've seen her they both have dementia his is from [name] stroke and her grandmother's was progressive. Nonetheless she states even her mom who knows her condition and situation has only help two of those days to take care of them she's been taking care of the rest. Fortunately there does not appear to be any signs of active infection in regard to her toe at this point although obviously it doesn't look as good as it did previous. I think this is directly related to her not taking off the pressure and friction by way of taking things easy. Though I completely understand what's going on. 01/29/19 on evaluation today patient's tools are actually appears to be showing some signs of improvement today compared to last week's evaluation as far as not necessarily the overall size of the wound but the fact that she has some new skin growth in between the two ends of the wound opening. Overall I feel like she has done well she states that she had [name] family member give her what sounds to be [name] CAM walker boot which has been helpful as well. 02/05/19 on evaluation today patient's wound bed actually appears to be doing significantly better in regard to her overall appearance of the size of the wound. With that being said she is still having an issue with offloading efficiently enough to get this to close. Apparently there is some signs of infection at this point as well unfortunately. Previously she's done well of Augmentin I really do not see anything that needs to be culture currently but there theme and cellulitis  of the foot that I'm seeing I'm gonna go ahead and place her on an antibiotic today to try to help clear this up. 02/12/2019 on evaluation today patient actually appears to be doing poorly in regard to her overall wound status. She tells me she has been using her offloading shoe but actually comes in today wearing her tennis shoe with the AFO brace. Again as I previously discussed with her this is really not sufficient to allow the area to heal appropriately. Nonetheless  she continues to be somewhat noncompliant and I do wonder based on what she has told my nurse in the past as to whether or not [name] good portion of this noncompliance may be recreational drug and alcohol related. She has had [name] history of heroin overdose and this was fairly recently in the past couple of months that have been seeing her. Nonetheless overall I feel like her wound looks significantly worse today compared to what it was previous. She still has significant erythema despite the Augmentin I am not sure that this is an appropriate medication for her infection I am also concerned that the infection may have gone down into her bone. 02/19/19 on evaluation today patient actually appears to be doing about the same in regard to her toe ulcer. Unfortunately she continues to show signs of bone exposure and infection at this point. There does not appear to be any evidence of worsening of the infection but I'm also not really sure that it's getting significantly better. She is on the Augmentin which should be sufficient for the Staphylococcus aureus infection that she has at this point. With that being said she may need IV antibiotics to more appropriately treat this. We did have [name] discussion today about hyperbaric option therapy. 02/28/19 on evaluation today patient actually appears to be doing much worse in regard to the wound on her left great toe as compared to even my previous evaluation last week. Unfortunately this seems to be  training in [name] pretty poor direction. Her toe was actually now starting to angle laterally and I can actually see the entire joint area of the proximal portion of the digit where is the distal portion of the digit again is no longer even in contact with the joint line. Unfortunately there's [name] lot more necrotic tissue around the edge and the toe appears to be showing signs of becoming gangrenous in my pinion. I'm very concerned about were things stand at this point. She did see infectious disease and they are planning to send in [name] prescription for Sivextro for her and apparently this has been approved. With that being said I don't think she should avoid taking this but at the same time I'm not sure that it's gonna be sufficient to save her toe at this point. She tells me that she still having to care for grandparents which I think is putting quite [name] bit of strain on her foot and specifically the total area and has caused this to break down even to [name] greater degree than would've otherwise been expected. 03/05/19 on evaluation today patient actually appears to be doing quite well in regard to her toe all things considering. She still has bone exposed but there appears to be much less your thing on overall the appearance of the wound and the toe itself is dramatically improved. She still does have some issues currently obviously with infection she did see vascular as well and there concerned that her blood flow to the toad. For that reason they are setting up for an angiogram next week. 03/14/19 on evaluation today patient appears to be doing very poor in regard to her toe and specifically in regard to the ulceration and the fact that she's starting to notice the toe was leaning even more towards the lateral aspect and the complete joint is visible on the proximal aspect of the joint. Nonetheless she's also noted [name] significant odor and the tip of the toe is turning more dark and necrotic appearing. Overall  I  think she is getting worse not better as far as this is concerned. For that reason I am recommending at this point that she likely needs to be seen for likely amputation. READMISSION 03/19/2021 This is [name] patient that we cared for in this clinic for [name] prolonged period of time in 2019 and 2020 with [name] left foot and left first toe wound. 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 ([id number]) 04/09/20 by Dr. Luana Shu. In December 2021 she had an ulcer on her right great toe as well as the fourth and fifth toes. 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 She also had an angiogram at that time and underwent angioplasty of the right anterior tibial artery. In any case she claims that the wound on the right foot is closed I did not look at this today which was probably an oversight although I think that should be done next week. 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 According to Dr. Deborra Medina last review that she was out of the area being cared for by another physician but recently came back to his attention. 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 [name] culture of this area showed E. coli apparently before she came back to see Dr. Luana Shu she was supposed to be receiving antibiotics but she did not really take them. Nor is she offloading this area at all. Finally her last hemoglobin A1c listed in epic was in March 2022 at 14.1 she says things are [name] lot better since then although I am not sure. 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 She had this wound at the time. During this admission she had E. coli sepsis [name] TEE was negative. Past medical history is extensive and includes type 2 diabetes with peripheral  neuropathy cardiomyopathy with an ejection fraction of 33%, hypertension, hyperlipidemia chronic renal failure stage III history of substance abuse with cocaine although she claims to be clean now verified by her mother. She is still [name] heavy cigarette smoker. She has [name] history of bipolar disorder seizure disorder ABI in our clinic was 1.05 6/1; left midfoot in the setting of [name] TMA done previously. Round circular wound with [name] "knuckle" of protruding tissue. The problem is that the knuckle was not attached to any of the surrounding granulation and this probed proximally widely I removed [name] large portion of this tissue. This wound goes with considerable undermining laterally. I do not feel any bone there was no purulence but this is [name] deep wound. 6/8; in spite of the debridement I did last week. She arrives with [name] wound looking exactly the same. [name] protruding "knuckle" of tissue nonadherent to most of the surrounding tissue. There is considerable depth around this from 6-12 o'clock at 2.7 cm and undermining of 1 cm. This does not look overtly infected and the x-ray I did last week was negative for any osseous abnormalities. We have been using silver collagen 6/15; deep tissue culture I did last week showed moderate staph aureus and moderate Pseudomonas. This will definitely require prolonged antibiotic therapy. The pathology on the protuberant area was negative for malignancy fungus etc. 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 We have  been using silver collagen. I am going to be prescribing Levaquin for 2 weeks. Her CT scan of the foot is down for 7/5 Objective Constitutional Patient is hypotensive. However appears stable and well. Pulse regular and within target range for patient.Marland Kitchen Respirations regular, non-labored and within target range.. Temperature is normal and within the target range for the patient.Marland Kitchen appears in no distress. Vitals  Time Taken: 12:54 PM, Height: 69 in, Weight: 185 lbs, BMI: 27.3, Temperature: 98.8 F, Pulse: 78 bpm, Respiratory Rate: 18 breaths/min, Blood Pressure: 90/67 mmHg, Capillary Blood Glucose: 179 mg/dl. General Notes: CBG per patient General Notes: Wound exam; somewhat better looking this week. Still [name] wound with the center part has deep probing depth from about 11-1 o'clock. This does not go to bone however. There is no purulent drainage. Using [name] #5 curette extensive debridement once again Integumentary (Hair, Skin) Wound #7 status is Open. Original cause of wound was Gradually Appeared. The date acquired was: 08/07/2020. The wound has been in treatment 2 weeks. The wound is located on the [place]. The wound measures 1.7cm length x 2cm width x 1.1cm depth; 2.67cm^2 area and 2.937cm^3 volume. There is Fat Layer (Subcutaneous Tissue) exposed. There is no tunneling noted, however, there is undermining starting at 12:00 and ending at 12:00 with [name] maximum distance of 1.5cm. There is [name] medium amount of serous drainage noted. The wound margin is epibole. There is no granulation within the wound bed. There is [name] large (67-100%) amount of necrotic tissue within the wound bed including Adherent Slough. Assessment Active Problems ICD-10 Type 2 diabetes mellitus with foot ulcer 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ity Type 2 diabetes mellitus with diabetic polyneuropathy Childrey, Oluwanifemi [name]. ([id number]) Procedures Wound #7 Pre-procedure diagnosis of Wound #7 is [name] Pressure Ulcer located on the Left,Midline,Plantar Foot . There was [name] Excisional Skin/Subcutaneous Tissue Debridement with [name] total area of 3.4 sq cm performed by Ricard [name], MD. With the following instrument(s): Curette to remove Viable and Non-Viable tissue/material. Material removed includes Subcutaneous Tissue. No specimens were taken. [name] time out was conducted at 13:19, prior to  the start of the procedure. [name] Minimum amount of bleeding was controlled with Pressure. The procedure was tolerated well. Post Debridement Measurements: 1.7cm length x 2cm width x 1.1cm depth; 2.937cm^3 volume. Post debridement Stage noted as Category/Stage II. Character of Wound/Ulcer Post Debridement is stable. Post procedure Diagnosis Wound #7: Same as Pre-Procedure Plan Follow-up Appointments: Return Appointment in 1 week. Bathing/ Shower/ Hygiene: Clean wound with Normal Saline or wound cleanser. Off-Loading: Open toe surgical shoe with peg assist. Medications-Please add to medication list.: P.O. Antibiotics - Levaquin The following medication(s) was prescribed: cefdinir oral 300 mg capsule capsule oral for wound infection starting 04/07/2021 clindamycin HCl oral 300 mg capsule 1 capsule oral tid for 14days for wound infection starting 04/07/2021 General Notes: CT Scan April 27, 2021 @ 9:15am WOUND #7: - Foot Wound Laterality: Plantar, Left, Midline Cleanser: Soap and Water 3 x Per Week/30 Days Discharge Instructions: Gently cleanse wound with antibacterial soap, rinse and pat dry prior to dressing wounds Primary Dressing: Prisma 4.34 (in) 3 x Per Week/30 Days Discharge Instructions: Moisten with hydrogel-tuck in undermining area Secondary Dressing: Bordered Gauze Sterile-HBD 4x4 (in/in) 3 x Per Week/30 Days Discharge Instructions: Cover wound with Bordered Guaze Sterile as directed 1. I could not use [name] quinolone because of multiple drug interactions 2. The staff aureus was resistant to tetracycline. She apparently also has  severe chronic renal failure. I therefore settled on clindamycin 300 3 times daily and cefdinir 300 twice daily both for 2 weeks 3. This is while we await for the CT scan of the foot and check for underlying deep tissue infection or osteomyelitis. 4. If she has either one of the latter 2 issues shows probably need to see infectious disease 5. She told me she could not  have an MRI because of hardware in her leg but I have not had [name] chance to see if this is real or if we could prove or disprove this Electronic Signature(s) Signed: 04/07/2021 4:10:05 PM By: Linton Ham MD Entered By: Linton Ham on 04/07/2021 13:36:50 Fergeson, Raiza [name]. ([id number]) -------------------------------------------------------------------------------- SuperBill Details Patient Name: [name] Date of Service: 04/07/2021 Medical Record Number: [id number] Patient Account Number: [id number] Date of Birth/Sex: 07/16/1976 (45 y.o. F) Treating RN: Cornell Barman Primary Care Provider: Raelene Bott Other Clinician: Referring Provider: Raelene Bott Treating Provider/Extender: Tito Dine in Treatment: 2 Diagnosis Coding ICD-10 Codes Code Description E11.621 Type 2 diabetes mellitus with foot ulcer 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ity E11.42 Type 2 diabetes mellitus with diabetic polyneuropathy Facility Procedures CPT4 Code: [id number] Description: [id number] - DEB SUBQ TISSUE 20 SQ CM/< Modifier: Quantity: 1 CPT4 Code: Description: ICD-10 Diagnosis Description 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 E11.621 Type 2 diabetes mellitus with foot ulcer Modifier: severity Quantity: Physician Procedures CPT4 Code: [id number] Description: 11042 - WC PHYS SUBQ TISS 20 SQ CM Modifier: Quantity: 1 CPT4 Code: Description: ICD-10 Diagnosis Description 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 E11.621 Type 2 diabetes mellitus with foot ulcer Modifier: severity Quantity: Electronic Signature(s) Signed: 04/07/2021 4:10:05 PM By: Linton Ham MD Entered By: Linton Ham on 04/07/2021 13:37:07

## 2021-04-14 ENCOUNTER — Encounter: Payer: Medicaid Other | Admitting: Internal Medicine

## 2021-04-14 ENCOUNTER — Other Ambulatory Visit: Payer: Self-pay

## 2021-04-14 DIAGNOSIS — E11621 Type 2 diabetes mellitus with foot ulcer: Secondary | ICD-10-CM | POA: Diagnosis not present

## 2021-04-14 NOTE — Progress Notes (Signed)
[name], [name] ([id number]) Visit Report for 04/14/2021 Debridement Details Patient Name: [name], [name] Date of Service: 04/14/2021 12:45 PM Medical Record Number: [id number] Patient Account Number: [id number] Date of Birth/Sex: 1976-06-12 (45 y.o. F) Treating [name]: [name] Primary Care Provider: Raelene [name] Other Clinician: Referring Provider: Raelene [name] Treating Provider/Extender: [name] in Treatment: 3 Debridement Performed for Wound #7 Left,Midline,Plantar Foot Assessment: Performed By: Physician [name], MD Debridement Type: Debridement Level of Consciousness (Pre- Awake and Alert procedure): Pre-procedure Verification/Time Out Yes - 13:35 Taken: Start Time: 13:35 Total Area Debrided (L x W): 1.8 (cm) x 2 (cm) = 3.6 (cm) Tissue and other material Viable, Non-Viable, Callus, Subcutaneous, Skin: Epidermis debrided: Level: Skin/Subcutaneous Tissue Debridement Description: Excisional Instrument: Curette Bleeding: Minimum Hemostasis Achieved: Pressure Response to Treatment: Procedure was tolerated well Level of Consciousness (Post- Awake and Alert procedure): Post Debridement Measurements of Total Wound Length: (cm) 1.8 Stage: Category/Stage II Width: (cm) 2 Depth: (cm) 1.3 Volume: (cm) 3.676 Character of Wound/Ulcer Post Debridement: Stable Post Procedure Diagnosis Same as Pre-procedure Electronic Signature(s) Signed: 04/14/2021 4:26:18 PM By: Heather Ham MD Signed: 04/14/2021 4:33:50 PM By: [name], [name] Entered By: [name] on 04/14/2021 13:41:59 [name], [name] ([id number]) -------------------------------------------------------------------------------- HPI Details Patient Name: Heather Dear A. Date of Service: 04/14/2021 12:45 PM Medical Record Number: [id number] Patient Account Number: [id number] Date of Birth/Sex: 08/27/1976 (45 y.o. F) Treating [name]: [name] Primary Care Provider:  Raelene [name] Other Clinician: Referring Provider: Raelene [name] Treating Provider/Extender: [name] in Treatment: 3 History of Present Illness HPI Description: 01/18/18-She is here for initial evaluation of the left great toe ulcer. She is a poor historian in regards to timeframe in detail. She states approximately 4 weeks ago she lacerated her toe on something in the house. She followed up with her primary care who placed her on Bactrim and ultimately a second dose of Bactrim prior to coming to wound clinic. She states she has been treating the toe with peroxide, Betadine and a Band-Aid. She did not check her blood sugar this morning but checked it yesterday morning it was 327; she is unaware of a recent A1c and there are no current records. She saw Dr. she would've orthopedics last week for an old injury to the left ankle, she states he did not see her toe, nor did she bring it to his attention. She smokes approximately 1 pack cigarettes a day. Her social situation is concerning, she arrives this morning with her mother who appears extremely intoxicated/under the influence; her mother was asked to leave the room and be monitored by the patient's grandmother. The patient's aunt then accompanied the patient and the room throughout the rest of the appointment. We had a lengthy discussion regarding the deleterious effects of uncontrolled hyperglycemia and smoking as it relates to wound healing and overall health. She was strongly encouraged to decrease her smoking and get her diabetes under better control. She states she is currently on a diet and has cut down her [facility] consumption. The left toe is erythematous, macerated and slightly edematous with malodor present. The edema in her left foot is below her baseline, there is no erythema streaking. We will treat her with Santyl, doxycycline; we have ordered and xray, culture and provided a Peg assist surgical shoe and cultured  the wound. 01/25/18-She is here in follow-up evaluation for a left great toe ulcer and presents with an abscess to her suprapubic area. She states her blood sugars  remain elevated, feeling "sick" and if levels are below 250, but she is trying. She has made no attempt to decrease her smoking stating that we "can't take away her food in her cigarettes". She has been compliant with offloading using the PEG assist you. She is using Santyl daily. the culture obtained last week grew staph aureus and Enterococcus faecalis; continues on the doxycycline and Augmentin was added on Monday. The suprapubic area has erythema, no femoral variation, purple discoloration, minimal induration, was accessed with a cotton tip applicator with sanguinopurulent drainage, this was cultured, I suspect the current antibiotic treatment will cover and we will not add anything to her current treatment plan. She was advised to go to urgent care or ER with any change in redness, induration or fever. 02/01/18-She is here in follow-up evaluation for left great toe ulcers and a new abdominal abscess from last week. She was able to use packing until earlier this week, where she "forgot it was there". She states she was feeling ill with GI symptoms last week and was not taking her antibiotic. She states her glucose levels have been predominantly less than 200, with occasional levels between 200-250. She thinks this was contributing to her GI symptoms as they have resolved without intervention. There continues to be significant laceration to left toe, otherwise it clinically looks stable/improved. There is now less superficial opening to the lateral aspect of the great toe that was residual blister. We will transition to [facility] to all wounds, she will continue her Augmentin. If there is no change or deterioration next week for reculture. 02/08/18-She is here in follow-up evaluation for left great toe ulcer and abdominal ulcer. There is  an improvement in both wounds. She has been wrapping her left toe with coban, not by our direction, which has created an area of discoloration to the medial aspect; she has been advised to NOT use coban secondary to her neuropathy. She states her glucose levels have been high over this last week ranging from 200-350, she continues to smoke. She admits to being less compliant with her offloading shoe. We will continue with same treatment plan and she will follow-up next week. 02/15/18-She is here in follow-up evaluation for left great toe ulcer and abdominal ulcer. The abdominal ulcer is epithelialized. The left great toe ulcer is improved and all injury from last week using the Coban wrap is resolved, the lateral ulcer is healed. She admits to noncompliance with wearing offloading shoe and admits to glucose levels being greater than 300 most of the week. She continues to smoke and expresses no desire to quit. There is one area medially that probes deeper than it has historically, erythema to the toe and dorsal foot has consistently waxed and waned. There is no overt signs of cellulitis or infection but we will culture the wound for any occult infection given the new area of depth and erythema. We will hold off on sensitivities for initiation of antibiotic therapy. 02/22/18-She is here in follow up evaluation for left great toe ulcer. There is overall significant improvement in both wound appearance, erythema and edema with changes made last week. She was not initiated on antibiotic therapy. Culture obtained last week showed oxacillin sensitive staph aureus, sensitive to clindamycin. Clindamycin has been called into the pharmacy but she has been instructed to hold off on initiation secondary to overall clinical improvement and her history of antibiotic intolerance. She has been instructed to contact the clinic with any noted changes/deterioration and the wound,  erythema, edema and/or pain. She will  follow-up next week. She continues to smoke and her glucose levels remain elevated >250; she admits to compliance with offloading shoe 03/01/18 on evaluation today patient appears to be doing fairly well in regard to her left first toe ulcer. She has been tolerating the dressing changes with the [facility] Dressing without complication and overall this has definitely showed signs of improvement according to records as well is what the patient tells me today. I'm very pleased in that regard. She is having no pain today 03/08/18 She is here for follow up evaluation of a left great toe ulcer. She remains non-compliant with glucose control and smoking cessation; glucose levels consistently >200. She states that she got new shoe inserts/peg assist. She admits to compliance with offloading. Since my last evaluation there is significant improvement. We will switch to prisma at this time and she will follow up next week. She is noted to be tachycardic at this appointment, heart rate 120s; she has a history of heart rate 70-130 according to our records. She admits to extreme agitation r/t personal issues; she was advised to monitor her heartrate and contact her physician if it does not return to a more normal range (<100). She takes cardizem twice daily. 03/15/18-She is here in follow-up evaluation for left great toe ulcer. She remains noncompliant with glucose control and smoking cessation. She admits to compliance with wearing offloading shoe. The ulcer is improved/stable and we will continue with the same treatment plan and she will follow-up next week 03/22/18-She is here for evaluation for left great toe ulcer. There continues to be significant improvement despite recurrent hyperglycemia (over 500 yesterday) and she continues to smoke. She has been compliant with offloading and we will continue with same treatment plan and she will follow-up next week. 03/29/18-She is here for evaluation for left great toe  ulcer. Despite continuing to smoke and uncontrolled diabetes she continues to improve. She is compliant with offloading shoe. We will continue with the same treatment plan and she will follow-up next week 04/05/18- She is here in follow up evaluation for a left great toe ulcer; she presents with small pustule to left fifth toe (resembles ant bite). She admits to compliance with wearing offloading shoe; continues to smoke or have uncontrolled blood glucose control. There is more callus than usual with evidence of bleeding; she denies known trauma. 04/12/18-She is here for evaluation of left great toe ulcer. Despite noncompliance with glycemic control and smoking she continues to make Melendrez, Antonella A. ([id number]) improvement. She continues to wear offloading shoe. The pustule, that was identified last week, to the left fifth toe is resolved. She will follow-up in 2 weeks 05/03/18-she is seen in follow-up evaluation for a left great toe ulcer. She is compliant with offloading, otherwise noncompliant with glycemic control and smoking. She has plateaued and there is minimal improvement noted. We will transition to [facility], replaced the insert to her surgical shoe and she will follow-up in one week 05/10/18- She is here in follow up evaluation for a left great toe ulcer. It appears stable despite measurement change. We will continue with same treatment plan and follow up next week. 05/24/18-She is seen in follow-up evaluation for a left great toe ulcer. She remains compliant with offloading, has made significant improvement in her diet, decreasing the amount of sugar/soda. She said her recent A1c was 10.9 which is lower than. She did see a diabetic nutritionist/educator yesterday. She continues to smoke. We  will continue with the same treatment plan and she'll follow-up next week. 05/31/18- She is seen in follow-up evaluation for left great toe ulcer. She continues to remain compliant with offloading,  continues to make improvement in her diet, increasing her water and decreasing the amount of sugar/soda. She does continue to smoke with no desire to quit. We will apply Prisma to the depth and Hydrofera Blue over. We have not received insurance authorization for oasis. She will follow up next week. 06/07/18-She is seen in follow-up evaluation for left great toe ulcer. It has stalled according to today's measurements although base appears stable. She says she saw a diabetic educator yesterday; her average blood sugars are less than 300 which is an improvement for her. She continues to smoke and states "that's my next step" She continues with water over soda. We will order for xray, culture and reinstate ace wrap compression prior to placing apligraf for next week. She is voicing no complaints or concerns. Her dressing will change to iodoflex over the next week in preparation for apligraf. 06/14/18-She is seen in follow-up evaluation for left great toe ulcer. Plain film x-ray performed last week was negative for osteomyelitis. Wound culture obtained last week grew strep B and OSSA; she is initiated on keflex and cefdinir today; there is erythema to the toe which could be from ace wrap compression, she has a history of wrapping too tight and has has been encouraged to maintain ace wraps that we place today. We will hold off on application of apligraf today, will apply next week after antibiotic therapy has been initiated. She admits today that she has resumed taking a shower with her foot/toe submerged in water, she has been reminded to keep foot/toe out of the bath water. She will be seen in follow up next week 06/21/18-she is seen in follow-up evaluation for left great toe ulcer. She is tolerating antibiotic therapy with no GI disturbance. The wound is stable. Apligraf was applied today. She has been decreasing her smoking, only had 4 cigarettes yesterday and 1 today. She continues being more compliant  in diabetic diet. She will follow-up next week for evaluation of site, if stable will remove at 2 weeks. 06/28/18- She is here in follow up evalution. Apligraf was placed last week, she states the dressing fell off on Tuesday and she was dressing with hydrofera blue. She is healed and will be discharged from the clinic today. She has been instructed to continue with smoking cessation, continue monitoring glucose levels, offloading for an additional 4 weeks and continue with hydrofera blue for additional two weeks for any possible microscopic opening. Readmission: 08/07/18 on evaluation today patient presents for reevaluation concerning the ulcer of her right great toe. She was previously discharged on 06/28/18 healed. Nonetheless she states that this began to show signs of drainage she subsequently went to her primary care provider. Subsequently an x-ray was performed on 08/01/18 which was negative. The patient was also placed on antibiotics at that time. Fortunately they should have been effective for the infection. Nonetheless she's been experiencing some improvement but still has a lot of drainage coming from the wound itself. 08/14/18 on evaluation today patient's wound actually does show signs of improvement in regard to the erythema at this point. She has completed the antibiotics. With that being said we did discuss the possibility of placing her in a total contact cast as of today although I think that I may want to give this just a little bit more  time to ensure nothing recurrence as far as her infection is concerned. I do not want to put in the cast and risk infection at that time if things are not completely resolved. With that being said she is gonna require some debridement today. 08/21/18 on evaluation today patient actually appears to be doing okay in regard to her toe ulcer. She's been tolerating the dressing changes without complication. With that being said it does appear that she is  ready and in fact I think it's appropriate for [place] to go ahead and initiate the total contact cast today. Nonetheless she will require some sharp debridement to prepare the wound for application. Overall I feel like things have been progressing well but we do need to do something to get this to close more readily. 08/24/18 patient seen today for reevaluation after having had the total contact cast applied on Tuesday. She seems to have done very well the wound appears to be doing great and overall I'm pleased with the progress that she's made. There were no abnormal areas of rubbing from the cast on her lower extremity. 08/30/18 on evaluation today patient actually appears to be completely healed in regard to her plantar toe ulcer. She tells me at this point she's been having a lot of issues with the cast. She almost fell a couple of times the state shall the step of her dog a couple times as well. This is been a very frustrating process for her other nonetheless she has completely healed the wound which is excellent news. Overall there does not appear to be the evidence of infection at this time which is great news. 09/11/18 evaluation today patient presents for follow-up concerning her great toe ulcer on the left which has unfortunately reopened since I last saw her which was only a couple of weeks ago. Unfortunately she was not able to get in to get the shoe and potentially the AFO that's gonna be necessary due to her left foot drop. She continues with offloading shoe but this is not enough to prevent her from reopening it appears. When we last had her in the total contact cast she did well from a healing standpoint but unfortunately the wound reopened as soon as she came out of the cast within just a couple of weeks. 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 This is also part of the reason why she falls on a regular basis. I just do not believe that is gonna be safe for [place] to reinitiate the total contact cast as last time we had this on she fell 3 times one week which is definitely not normal for her. 09/18/18 upon evaluation today the patient actually appears to be doing about the same in regard to her toe ulcer. She did not contact [employer] as I asked her to even though I had given her the prescription. In fact she actually states that she has no idea where the prescription is. She did apparently call [employer] and they told her that all she needed to do was bring the prescription in order to be able to be seen and work on getting the AFO for her left foot. With all that being said she still does not have an appointment and I'm not sure were  things stand that regard. I will give her a new prescription today in order to contact them to get this set up. 09/25/18 on evaluation today patient actually appears to be doing about the same in regard to her toes ulcer. She does have a small areas which seems to have a lot of callous buildup around the edge of the wound which is going to need sharp debridement today. She still is waiting to be scheduled for evaluation with [employer] for possibility of an AFO. She states there supposed to call her tomorrow to get this set up. Unfortunately it does appear that her foot specifically the toe area is showing signs of erythema. There does not appear to be any systemic infection which is in these good news. [name], [name] ([id number]) 10/02/18 on evaluation today patient actually appears to be doing about the same in regard to her toe ulcer. This really has not done too well although it's not significantly larger it's also not significantly smaller. She has been tolerating the dressing changes without complication. She actually has her appointment with [employer] and  [place] tomorrow to hopefully be measured for obtaining and AFO splint. I think this would be helpful preventing this from reoccurring. We had contemplated starting the cast this week although to be honest I am reluctant to do that as she's been having nausea, vomiting, and seizure activity over the past three days. She has a history of seizures and have been told is nothing that can be done for these. With that being said I do believe that along with the seizures have the nausea vomiting which upon further questioning doesn't seem to be the normal for her and makes me concerned for the possibility of infection or something else going on. I discussed this with the patient and her mother during the office visit today. I do not feel the wound is effective but maybe something else. The responses this was "this just happens to her at times and we don't know why". They did not seem to be interested in going to the hospital to have this checked out further. 10/09/18 on evaluation today patient presents for follow-up concerning her ongoing toe ulcer. She has been tolerating the dressing changes without complication. Fortunately there does not appear to be any evidence of infection which is great news however I do think that the patient would benefit from going ahead for with the total contact cast. She's actually in a wheelchair today she tells me that she will use her walker if we initiate the cast. I was very specific about the fact that if we were gonna do the cast I wanted to make sure that she was using the walker in order to prevent any falls. She tells me she does not have stairs that she has to traverse on a regular basis at her home. She has not had any seizures since last week again that something that happens to her often she tells me she did talk to [employer] and they said that it may take up to three weeks to get the brace approved for her. Hopefully that will not take that long but nonetheless in  the meantime I do think the cast could be of benefit. 10/12/18 on evaluation today patient appears to be doing rather well in regard to her toe ulcer. It's just been a few days and already this is significantly improved both as far as overall appearance and size. Fortunately there's no sign of infection. She is here for  her first obligatory cast change. 10/19/18 Seen today for follow up and management of left great toe ulcer. Wound continues to show improvement. Noted small open area with seroussang drainage with palpation. Denies any increased pain or recent fevers during visit. She will continue calcium alginate with offloading shoe. Denies any questions or concerns during visit. 10/26/18 on evaluation today patient appears to be doing about the same as when I last saw her in regard to her wound bed. Fortunately there does not appear to be any signs of infection. Unfortunately she continues to have a breakdown in regard to the toe region any time that she is not in the cast. It takes almost no time at all for this to happen. Nonetheless she still has not heard anything from the brace being made by [employer] as to when exactly this will be available to her. Fortunately there is no signs of infection at this time. 10/30/18 on evaluation today patient presents for application of the total contact cast as we just received him this morning. Fortunately we are gonna be able to apply this to her today which is great news. She continues to have no significant pain which is good news. Overall I do feel like things have been improving while she was the cast is when she doesn't have a cast that things get worse. She still has not really heard anything from [place] regarding her brace. 11/02/18 upon evaluation today patient's wound already appears to be doing significantly better which is good news. Fortunately there does not appear to be any signs of infection also good news. Overall I do think the total contact  cast as before is helping to heal this area unfortunately it's just not gonna likely keep the area closed and healed without her getting her brace at least. Again the foot drop is a significant issue for her. 11/09/18 on evaluation today patient appears to be doing excellent in regard to her toe ulcer which in fact is completely healed. Fortunately we finally got the situation squared away with the paperwork which was needed to proceed with getting her brace approved by Medicaid. I have filled that out unfortunately that information has been sent to the orthopedic office that I worked at 2 1/2 years ago and not tired Current wound care measures. Fortunately she seems to be doing very well at this time. 11/23/18 on evaluation today patient appears to be doing More Poorly Compared to Last Time I Saw Her. At [facility] She Had Completely Healed. Currently she is continuing to have issues with reopening. She states that she just found out that the brace was approved through Medicaid now she just has to go get measured in order to have this fitted for her and then made. Subsequently she does not have an appointment for this yet that is going to complicate things we obviously cannot put her back in the cast if we do not have everything measured because they're not gonna be able to measure her foot while she is in the cast. 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 Obviously this is unfortunate and does have me somewhat worried as well. 11/30/18 on evaluation today patient's toe ulcer actually appears to be doing fairly well. The good news is she will be getting her brace in the shoes next week on Wednesday. Hopefully we will be able to get this to heal  without having to go back in the cast however she may need the cast in order to get the wound completely heal and then go from there. Fortunately there's no signs  of infection at this time. 12/07/18 on evaluation today patient fortunately did receive her brace and she states she could tell this definitely makes her walk better. With that being said she's been having issues with her toe where she noticed yesterday there was a lot of tissue that was loosing off this appears to be much larger than what it was previous. She also states that her leg has been read putting much across the top of her foot just about the ankle although this seems to be receiving somewhat. The total area is still red and appears to be someone infected as best I can tell. She is previously taken Bactrim and that may be a good option for her today as well. We are gonna see what I wound culture shows as well and I think that this is definitely appropriate. With that being said outside of the culture I still need to initiate something in the interim and that's what I'm gonna go ahead and select Bactrim is a good option for her. 12/14/18 on evaluation today patient appears to be doing better in regard to her left great toe ulcer as compared to last week's evaluation. There's still some erythema although this is significantly improved which is excellent news. Overall I do believe that she is making good progress is still gonna take some time before she is where I would like her to be from the standpoint of being able to place her back into the total contact cast. Hopefully we will be where we need to be by next week. 12/21/18 on evaluation today patient actually appears to be doing poorly in regard to her toe ulcer. She's been tolerating the dressing changes without complication. Fortunately there's no signs of systemic infection although she does have a lot of drainage from the toe ulcer and this does seem to be causing some issues at this point. She does have erythema on the distal portion of her toe that appears to be likely cellulitis. 12/28/18 on evaluation today patient actually appears to be  doing a little better in my pinion in regard to her toe ulcer. With that being said she still does have some evidence of infection at this time and for her culture she had both E. coli as well as enterococcus as organisms noted on evaluation. For that reason I think that though the Keflex likely has treated the E. coli rather well this has really done nothing for the enterococcus. We are going to have to initiate treatment for this specifically. SHITAL, DEWAARD A. ([id number]) 01/04/19 on evaluation today patient's toe actually appears to be doing better from the standpoint of infection. She currently would like to see about putting the cash back on I think that this is appropriate as long as she takes care of it and keeps it from getting wet. She is gonna have some drainage we can definitely pass this up with Drawtex and alginate to try to prevent as much drainage as possible from causing the problems. With that being said I do want to at least try her with the cast between now and Tuesday. If there any issues we can't continue to use it then I will discontinue the use of the cast at that point. 01/08/19 on evaluation today patient actually appears to be doing very well as  far as her foot ulcer specifically the great toe on the left is concerned. She did have an area of rubbing on the medial aspect of her left ankle which again is from the cast. Fortunately there's no signs of infection at this point in this appears to be a very slight skin breakdown. The patient tells me she felt it rubbing but didn't think it was that bad. Fortunately there is no signs of active infection at this time which is good news. No fevers, chills, nausea, or vomiting noted at this time. 01/15/19 on evaluation today patient actually appears to be doing well in regard to her toe ulcer. Again as previous she seems to do well and she has the cast on which indicates to me that during the time she doesn't have a cast on she's putting  way too much pressure on this region. Obviously I think that's gonna be an issue as with the current national emergency concerning the Covid-19 Virus it has been recommended that we discontinue the use of total contact casting by the chief medical officer of our company, Dr. Simona Huh. The reasoning is that if a patient becomes sick and cannot come into have the cast removed they could not just leave this on for an additional two weeks. Obviously the hospitals also do not want to receive patient's who are sick into the emergency department to potentially contaminate the region and spread the Covid-19 Virus among other sick individuals within the hospital system. Therefore at this point we are suspending the use of total contact cast until the current emergency subsides. This was all discussed with the patient today as well. 01/22/19 on evaluation today patient's wound on her left great toe appears to be doing slightly worse than previously noted last week. She tells me that she has been on this quite a bit in fact she tells me she's been awake for 38 straight hours. This is due to the fact that she's having to care for grandparents because nobody else will. She has been taking care of them for five the last seven days since I've seen her they both have dementia his is from a stroke and her grandmother's was progressive. Nonetheless she states even her mom who knows her condition and situation has only help two of those days to take care of them she's been taking care of the rest. Fortunately there does not appear to be any signs of active infection in regard to her toe at this point although obviously it doesn't look as good as it did previous. I think this is directly related to her not taking off the pressure and friction by way of taking things easy. Though I completely understand what's going on. 01/29/19 on evaluation today patient's tools are actually appears to be showing some signs of improvement  today compared to last week's evaluation as far as not necessarily the overall size of the wound but the fact that she has some new skin growth in between the two ends of the wound opening. Overall I feel like she has done well she states that she had a family member give her what sounds to be a CAM walker boot which has been helpful as well. 02/05/19 on evaluation today patient's wound bed actually appears to be doing significantly better in regard to her overall appearance of the size of the wound. With that being said she is still having an issue with offloading efficiently enough to get this to close. Apparently there is some  signs of infection at this point as well unfortunately. Previously she's done well of Augmentin I really do not see anything that needs to be culture currently but there theme and cellulitis of the foot that I'm seeing I'm gonna go ahead and place her on an antibiotic today to try to help clear this up. 02/12/2019 on evaluation today patient actually appears to be doing poorly in regard to her overall wound status. She tells me she has been using her offloading shoe but actually comes in today wearing her tennis shoe with the AFO brace. Again as I previously discussed with her this is really not sufficient to allow the area to heal appropriately. Nonetheless she continues to be somewhat noncompliant and I do wonder based on what she has told my nurse in the past as to whether or not a good portion of this noncompliance may be recreational drug and alcohol related. She has had a history of heroin overdose and this was fairly recently in the past couple of months that have been seeing her. Nonetheless overall I feel like her wound looks significantly worse today compared to what it was previous. She still has significant erythema despite the Augmentin I am not sure that this is an appropriate medication for her infection I am also concerned that the infection may have gone down  into her bone. 02/19/19 on evaluation today patient actually appears to be doing about the same in regard to her toe ulcer. Unfortunately she continues to show signs of bone exposure and infection at this point. There does not appear to be any evidence of worsening of the infection but I'm also not really sure that it's getting significantly better. She is on the Augmentin which should be sufficient for the Staphylococcus aureus infection that she has at this point. With that being said she may need IV antibiotics to more appropriately treat this. We did have a discussion today about hyperbaric option therapy. 02/28/19 on evaluation today patient actually appears to be doing much worse in regard to the wound on her left great toe as compared to even my previous evaluation last week. Unfortunately this seems to be training in a pretty poor direction. Her toe was actually now starting to angle laterally and I can actually see the entire joint area of the proximal portion of the digit where is the distal portion of the digit again is no longer even in contact with the joint line. Unfortunately there's a lot more necrotic tissue around the edge and the toe appears to be showing signs of becoming gangrenous in my pinion. I'm very concerned about were things stand at this point. She did see infectious disease and they are planning to send in a prescription for Sivextro for her and apparently this has been approved. With that being said I don't think she should avoid taking this but at the same time I'm not sure that it's gonna be sufficient to save her toe at this point. 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 03/05/19 on evaluation today patient actually appears to be doing quite well in regard to her toe  all things considering. She still has bone exposed but there appears to be much less your thing on overall the appearance of the wound and the  toe itself is dramatically improved. She still does have some issues currently obviously with infection she did see vascular as well and there concerned that her blood flow to the toad. For that reason they are setting up for an angiogram next week. 03/14/19 on evaluation today patient appears to be doing very poor in regard to her toe and specifically in regard to the ulceration and the fact that she's starting to notice the toe was leaning even more towards the lateral aspect and the complete joint is visible on the proximal aspect of the joint. Nonetheless she's also noted a significant odor and the tip of the toe is turning more dark and necrotic appearing. Overall I think she is getting worse not better as far as this is concerned. For that reason I am recommending at this point that she likely needs to be seen for likely amputation. READMISSION 03/19/2021 This is a patient that we cared for in this clinic for a prolonged period of time in 2019 and 2020 with a left foot and left first toe wound. 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 ([id number]) 04/09/20 by Dr. Luana Shu. In December 2021 she had an ulcer on her right great toe as well as the fourth and fifth toes. 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 She also had an angiogram at that time and underwent angioplasty of the right anterior tibial artery. In any case she claims that the wound on the right foot is closed I did not look at this today which was probably an oversight although I think that should be done next week. After her surgery she developed a dehiscence but I do not see any follow-up of this. According to Dr. Deborra Medina last review  that she was out of the area being cared for by another physician but recently came back to his attention. 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 A culture of this area showed E. coli apparently before she came back to see Dr. Luana Shu she was supposed to be receiving antibiotics but she did not really take them. Nor is she offloading this area at all. Finally her last hemoglobin A1c listed in epic was in March 2022 at 14.1 she says things are a lot better since then although I am not sure. 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 She had this wound at the time. During this admission she had E. coli sepsis a TEE was negative. 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 She is still a heavy cigarette smoker. She has a history of bipolar disorder seizure disorder ABI in our clinic was 1.05 6/1; left midfoot in the setting of a TMA done previously. Round circular wound with a "knuckle" of protruding tissue. The problem is that the knuckle was not attached to any of the surrounding granulation and this probed proximally widely I removed a large portion of this tissue. This wound goes with considerable undermining laterally. I do not feel any bone there was no purulence but this is a deep wound. 6/8; in spite of the debridement  I did last week. She arrives with a wound looking exactly the same. A protruding "knuckle" of tissue nonadherent to most of the surrounding tissue. There is considerable depth around this from 6-12 o'clock at 2.7 cm and undermining of 1 cm. This does not look overtly infected and the x-ray I did last week was negative for any  osseous abnormalities. We have been using silver collagen 6/15; deep tissue culture I did last week showed moderate staph aureus and moderate Pseudomonas. This will definitely require prolonged antibiotic therapy. The pathology on the protuberant area was negative for malignancy fungus etc. 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 We have been using silver collagen. I am going to be prescribing Levaquin for 2 weeks. Her CT scan of the foot is down for 7/5 6/22; CT scan of the foot on 7 5. She says she has hardware in the left leg from her previous fracture. She is on the Levaquin for the deep tissue culture I did that showed methicillin sensitive staph aureus and Pseudomonas. I gave her a 2-week supply and she will have another week. She arrives in clinic today with the same protuberant tissue however this is nonadherent to the tissue surrounding it. I am really at a loss to explain this unless there is underlying deep tissue infection Electronic Signature(s) Signed: 04/14/2021 4:26:18 PM By: Heather Ham MD Entered By: [name] on 04/14/2021 13:43:05 [name] ([id number]) -------------------------------------------------------------------------------- Physical Exam Details Patient Name: Heather Dear A. Date of Service: 04/14/2021 12:45 PM Medical Record Number: [id number] Patient Account Number: [id number] Date of Birth/Sex: [birth year] (44 y.o. F) Treating [name]: [name] Primary Care Provider: Raelene [name] Other Clinician: Referring Provider: Raelene [name] Treating Provider/Extender: [name] in Treatment: 3 Constitutional Sitting or standing Blood Pressure is within target range for patient.. Pulse regular and within target range for patient.Marland Kitchen Respirations regular, non- labored and within target range.. Temperature is normal and within the target range for the patient.Marland Kitchen appears in no  distress. Notes Wound exam; really no different here. Thick skin around this wound which appears somewhat macerated I remove this with a #5 curette debris on the wound surface I also removed. Again the same protruding tissue that is nonadherent and probes widely around it. There is really no evidence at this point of surrounding infection. Electronic Signature(s) Signed: 04/14/2021 4:26:18 PM By: Heather Ham MD Entered By: [name] on 04/14/2021 13:44:05 [name] ([id number]) -------------------------------------------------------------------------------- Physician Orders Details Patient Name: Heather Dear A. Date of Service: 04/14/2021 12:45 PM Medical Record Number: [id number] Patient Account Number: [id number] Date of Birth/Sex: 07-19-1976 (45 y.o. F) Treating [name]: [name] Primary Care Provider: Raelene [name] Other Clinician: Referring Provider: Raelene [name] Treating Provider/Extender: [name] in Treatment: 3 Verbal / Phone Orders: No Diagnosis Coding Follow-up Appointments o Return Appointment in 1 week. Bathing/ Shower/ Hygiene o Clean wound with Normal Saline or wound cleanser. Off-Loading Left Lower Extremity o Open toe surgical shoe with peg assist. Medications-Please add to medication list. o P.O. Antibiotics - Levaquin Wound Treatment Wound #7 - Foot Wound Laterality: Plantar, Left, Midline Cleanser: Soap and Water 3 x Per Week/30 Days Discharge Instructions: Gently cleanse wound with antibacterial soap, rinse and pat dry prior to dressing wounds Primary Dressing: Prisma 4.34 (in) 3 x Per Week/30 Days Discharge Instructions: Moisten with hydrogel-tuck in undermining area Secondary Dressing: Bordered Gauze Sterile-HBD 4x4 (in/in) 3 x Per Week/30 Days Discharge Instructions: Cover wound with  Bordered Guaze Sterile as directed Engineer, maintenance) Signed: 04/14/2021 4:26:18 PM By: Heather Ham MD Signed: 04/14/2021  4:33:50 PM By: [name], [name] Entered By: [name], Heather Breeding on 04/14/2021 13:39:50 [name] ([id number]) -------------------------------------------------------------------------------- Problem List Details Patient Name: Heather Dear A. Date of Service: 04/14/2021 12:45 PM Medical Record Number: [id number] Patient Account Number: [id number] Date of Birth/Sex: [birth year] (45 y.o. F) Treating [name]: [name] Primary Care Provider: Raelene [name] Other Clinician: Referring Provider: Raelene [name] Treating Provider/Extender: [name] in Treatment: 3 Active Problems ICD-10 Encounter Code Description Active Date MDM Diagnosis E11.621 Type 2 diabetes mellitus with foot ulcer 03/19/2021 No Yes 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 03/19/2021 No Yes severity E11.42 Type 2 diabetes mellitus with diabetic polyneuropathy 03/19/2021 No Yes Inactive Problems Resolved Problems Electronic Signature(s) Signed: 04/14/2021 4:26:18 PM By: Heather Ham MD Entered By: [name] on 04/14/2021 13:41:34 [name], [name] ([id number]) -------------------------------------------------------------------------------- Progress Note Details Patient Name: Heather Dear A. Date of Service: 04/14/2021 12:45 PM Medical Record Number: [id number] Patient Account Number: [id number] Date of Birth/Sex: 12/02/1975 (45 y.o. F) Treating [name]: [name] Primary Care Provider: Raelene [name] Other Clinician: Referring Provider: Raelene [name] Treating Provider/Extender: [name] in Treatment: 3 Subjective History of Present Illness (HPI) 01/18/18-She is here for initial evaluation of the left great toe ulcer. She is a poor historian in regards to timeframe in detail. She states approximately 4 weeks ago she lacerated her toe on something in the house. She followed up with her primary care who placed her on  Bactrim and ultimately a second dose of Bactrim prior to coming to wound clinic. She states she has been treating the toe with peroxide, Betadine and a Band-Aid. She did not check her blood sugar this morning but checked it yesterday morning it was 327; she is unaware of a recent A1c and there are no current records. She saw Dr. she would've orthopedics last week for an old injury to the left ankle, she states he did not see her toe, nor did she bring it to his attention. She smokes approximately 1 pack cigarettes a day. Her social situation is concerning, she arrives this morning with her mother who appears extremely intoxicated/under the influence; her mother was asked to leave the room and be monitored by the patient's grandmother. The patient's aunt then accompanied the patient and the room throughout the rest of the appointment. We had a lengthy discussion regarding the deleterious effects of uncontrolled hyperglycemia and smoking as it relates to wound healing and overall health. She was strongly encouraged to decrease her smoking and get her diabetes under better control. She states she is currently on a diet and has cut down her [facility] consumption. The left toe is erythematous, macerated and slightly edematous with malodor present. The edema in her left foot is below her baseline, there is no erythema streaking. We will treat her with Santyl, doxycycline; we have ordered and xray, culture and provided a Peg assist surgical shoe and cultured the wound. 01/25/18-She is here in follow-up evaluation for a left great toe ulcer and presents with an abscess to her suprapubic area. She states her blood sugars remain elevated, feeling "sick" and if levels are below 250, but she is trying. She has made no attempt to decrease her smoking stating that we "can't take away her food in her cigarettes". She has been compliant with offloading using the PEG assist you. She is  using Santyl daily. the culture  obtained last week grew staph aureus and Enterococcus faecalis; continues on the doxycycline and Augmentin was added on Monday. The suprapubic area has erythema, no femoral variation, purple discoloration, minimal induration, was accessed with a cotton tip applicator with sanguinopurulent drainage, this was cultured, I suspect the current antibiotic treatment will cover and we will not add anything to her current treatment plan. She was advised to go to urgent care or ER with any change in redness, induration or fever. 02/01/18-She is here in follow-up evaluation for left great toe ulcers and a new abdominal abscess from last week. She was able to use packing until earlier this week, where she "forgot it was there". She states she was feeling ill with GI symptoms last week and was not taking her antibiotic. She states her glucose levels have been predominantly less than 200, with occasional levels between 200-250. She thinks this was contributing to her GI symptoms as they have resolved without intervention. There continues to be significant laceration to left toe, otherwise it clinically looks stable/improved. There is now less superficial opening to the lateral aspect of the great toe that was residual blister. We will transition to [facility] to all wounds, she will continue her Augmentin. If there is no change or deterioration next week for [facility]. 02/08/18-She is here in follow-up evaluation for left great toe ulcer and abdominal ulcer. There is an improvement in both wounds. She has been wrapping her left toe with coban, not by our direction, which has created an area of discoloration to the medial aspect; she has been advised to NOT use coban secondary to her neuropathy. She states her glucose levels have been high over this last week ranging from 200-350, she continues to smoke. She admits to being less compliant with her offloading shoe. We will continue with same treatment plan and she  will follow-up next week. 02/15/18-She is here in follow-up evaluation for left great toe ulcer and abdominal ulcer. The abdominal ulcer is epithelialized. The left great toe ulcer is improved and all injury from last week using the Coban wrap is resolved, the lateral ulcer is healed. She admits to noncompliance with wearing offloading shoe and admits to glucose levels being greater than 300 most of the week. She continues to smoke and expresses no desire to quit. There is one area medially that probes deeper than it has historically, erythema to the toe and dorsal foot has consistently waxed and waned. There is no overt signs of cellulitis or infection but we will culture the wound for any occult infection given the new area of depth and erythema. We will hold off on sensitivities for initiation of antibiotic therapy. 02/22/18-She is here in follow up evaluation for left great toe ulcer. There is overall significant improvement in both wound appearance, erythema and edema with changes made last week. She was not initiated on antibiotic therapy. Culture obtained last week showed oxacillin sensitive staph aureus, sensitive to clindamycin. Clindamycin has been called into the pharmacy but she has been instructed to hold off on initiation secondary to overall clinical improvement and her history of antibiotic intolerance. She has been instructed to contact the clinic with any noted changes/deterioration and the wound, erythema, edema and/or pain. She will follow-up next week. She continues to smoke and her glucose levels remain elevated >250; she admits to compliance with offloading shoe 03/01/18 on evaluation today patient appears to be doing fairly well in regard to her left first toe ulcer.  She has been tolerating the dressing changes with the [facility] Dressing without complication and overall this has definitely showed signs of improvement according to records as well is what the patient tells me  today. I'm very pleased in that regard. She is having no pain today 03/08/18 She is here for follow up evaluation of a left great toe ulcer. She remains non-compliant with glucose control and smoking cessation; glucose levels consistently >200. She states that she got new shoe inserts/peg assist. She admits to compliance with offloading. Since my last evaluation there is significant improvement. We will switch to prisma at this time and she will follow up next week. She is noted to be tachycardic at this appointment, heart rate 120s; she has a history of heart rate 70-130 according to our records. She admits to extreme agitation r/t personal issues; she was advised to monitor her heartrate and contact her physician if it does not return to a more normal range (<100). She takes cardizem twice daily. 03/15/18-She is here in follow-up evaluation for left great toe ulcer. She remains noncompliant with glucose control and smoking cessation. She admits to compliance with wearing offloading shoe. The ulcer is improved/stable and we will continue with the same treatment plan and she will follow-up next week 03/22/18-She is here for evaluation for left great toe ulcer. There continues to be significant improvement despite recurrent hyperglycemia (over 500 yesterday) and she continues to smoke. She has been compliant with offloading and we will continue with same treatment plan and she will follow-up next week. 03/29/18-She is here for evaluation for left great toe ulcer. Despite continuing to smoke and uncontrolled diabetes she continues to improve. She is compliant with offloading shoe. We will continue with the same treatment plan and she will follow-up next week 04/05/18- She is here in follow up evaluation for a left great toe ulcer; she presents with small pustule to left fifth toe (resembles ant bite). She admits to compliance with wearing offloading shoe; continues to smoke or have uncontrolled blood glucose  control. There is more callus than usual with evidence of bleeding; she denies known trauma. 04/12/18-She is here for evaluation of left great toe ulcer. Despite noncompliance with glycemic control and smoking she continues to make [name], [name] ([id number]) improvement. She continues to wear offloading shoe. The pustule, that was identified last week, to the left fifth toe is resolved. She will follow-up in 2 weeks 05/03/18-she is seen in follow-up evaluation for a left great toe ulcer. She is compliant with offloading, otherwise noncompliant with glycemic control and smoking. She has plateaued and there is minimal improvement noted. We will transition to [facility], replaced the insert to her surgical shoe and she will follow-up in one week 05/10/18- She is here in follow up evaluation for a left great toe ulcer. It appears stable despite measurement change. We will continue with same treatment plan and follow up next week. 05/24/18-She is seen in follow-up evaluation for a left great toe ulcer. She remains compliant with offloading, has made significant improvement in her diet, decreasing the amount of sugar/soda. She said her recent A1c was 10.9 which is lower than. She did see a diabetic nutritionist/educator yesterday. She continues to smoke. We will continue with the same treatment plan and she'll follow-up next week. 05/31/18- She is seen in follow-up evaluation for left great toe ulcer. She continues to remain compliant with offloading, continues to make improvement in her diet, increasing her water and decreasing the amount of  sugar/soda. She does continue to smoke with no desire to quit. We will apply Prisma to the depth and Hydrofera Blue over. We have not received insurance authorization for oasis. She will follow up next week. 06/07/18-She is seen in follow-up evaluation for left great toe ulcer. It has stalled according to today's measurements although base appears stable. She says  she saw a diabetic educator yesterday; her average blood sugars are less than 300 which is an improvement for her. She continues to smoke and states "that's my next step" She continues with water over soda. We will order for xray, culture and reinstate ace wrap compression prior to placing apligraf for next week. She is voicing no complaints or concerns. Her dressing will change to iodoflex over the next week in preparation for apligraf. 06/14/18-She is seen in follow-up evaluation for left great toe ulcer. Plain film x-ray performed last week was negative for osteomyelitis. Wound culture obtained last week grew strep B and OSSA; she is initiated on keflex and cefdinir today; there is erythema to the toe which could be from ace wrap compression, she has a history of wrapping too tight and has has been encouraged to maintain ace wraps that we place today. We will hold off on application of apligraf today, will apply next week after antibiotic therapy has been initiated. She admits today that she has resumed taking a shower with her foot/toe submerged in water, she has been reminded to keep foot/toe out of the bath water. She will be seen in follow up next week 06/21/18-she is seen in follow-up evaluation for left great toe ulcer. She is tolerating antibiotic therapy with no GI disturbance. The wound is stable. Apligraf was applied today. She has been decreasing her smoking, only had 4 cigarettes yesterday and 1 today. She continues being more compliant in diabetic diet. She will follow-up next week for evaluation of site, if stable will remove at 2 weeks. 06/28/18- She is here in follow up evalution. Apligraf was placed last week, she states the dressing fell off on Tuesday and she was dressing with hydrofera blue. She is healed and will be discharged from the clinic today. She has been instructed to continue with smoking cessation, continue monitoring glucose levels, offloading for an additional 4 weeks  and continue with hydrofera blue for additional two weeks for any possible microscopic opening. Readmission: 08/07/18 on evaluation today patient presents for reevaluation concerning the ulcer of her right great toe. She was previously discharged on 06/28/18 healed. Nonetheless she states that this began to show signs of drainage she subsequently went to her primary care provider. Subsequently an x-ray was performed on 08/01/18 which was negative. The patient was also placed on antibiotics at that time. Fortunately they should have been effective for the infection. Nonetheless she's been experiencing some improvement but still has a lot of drainage coming from the wound itself. 08/14/18 on evaluation today patient's wound actually does show signs of improvement in regard to the erythema at this point. She has completed the antibiotics. With that being said we did discuss the possibility of placing her in a total contact cast as of today although I think that I may want to give this just a little bit more time to ensure nothing recurrence as far as her infection is concerned. I do not want to put in the cast and risk infection at that time if things are not completely resolved. With that being said she is gonna require some debridement today. 08/21/18 on  evaluation today patient actually appears to be doing okay in regard to her toe ulcer. She's been tolerating the dressing changes without complication. With that being said it does appear that she is ready and in fact I think it's appropriate for [place] to go ahead and initiate the total contact cast today. Nonetheless she will require some sharp debridement to prepare the wound for application. Overall I feel like things have been progressing well but we do need to do something to get this to close more readily. 08/24/18 patient seen today for reevaluation after having had the total contact cast applied on Tuesday. She seems to have done very well the wound  appears to be doing great and overall I'm pleased with the progress that she's made. There were no abnormal areas of rubbing from the cast on her lower extremity. 08/30/18 on evaluation today patient actually appears to be completely healed in regard to her plantar toe ulcer. She tells me at this point she's been having a lot of issues with the cast. She almost fell a couple of times the state shall the step of her dog a couple times as well. This is been a very frustrating process for her other nonetheless she has completely healed the wound which is excellent news. Overall there does not appear to be the evidence of infection at this time which is great news. 09/11/18 evaluation today patient presents for follow-up concerning her great toe ulcer on the left which has unfortunately reopened since I last saw her which was only a couple of weeks ago. Unfortunately she was not able to get in to get the shoe and potentially the AFO that's gonna be necessary due to her left foot drop. She continues with offloading shoe but this is not enough to prevent her from reopening it appears. When we last had her in the total contact cast she did well from a healing standpoint but unfortunately the wound reopened as soon as she came out of the cast within just a couple of weeks. 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 This is also part of the reason why she falls on a regular basis. I just do not believe that is gonna be safe for [place] to reinitiate the total contact cast as last time we had this on she fell 3 times one week which is definitely not normal for her. 09/18/18 upon evaluation today the patient actually appears to be doing about the same in regard to her toe ulcer. She did not contact [employer] as I asked her  to even though I had given her the prescription. In fact she actually states that she has no idea where the prescription is. She did apparently call [employer] and they told her that all she needed to do was bring the prescription in order to be able to be seen and work on getting the AFO for her left foot. With all that being said she still does not have an appointment and I'm not sure were things stand that regard. I will give her a new prescription today in order to contact them to get this set up. 09/25/18 on evaluation today patient actually appears to be doing about the same in regard to her toes ulcer. She does have a small  areas which seems to have a lot of callous buildup around the edge of the wound which is going to need sharp debridement today. She still is waiting to be scheduled for evaluation with [employer] for possibility of an AFO. She states there supposed to call her tomorrow to get this set up. Unfortunately it does appear that her foot specifically the toe area is showing signs of erythema. There does not appear to be any systemic infection which is in these good news. [name], [name] ([id number]) 10/02/18 on evaluation today patient actually appears to be doing about the same in regard to her toe ulcer. This really has not done too well although it's not significantly larger it's also not significantly smaller. She has been tolerating the dressing changes without complication. She actually has her appointment with [employer] and [facility] tomorrow to hopefully be measured for obtaining and AFO splint. I think this would be helpful preventing this from reoccurring. We had contemplated starting the cast this week although to be honest I am reluctant to do that as she's been having nausea, vomiting, and seizure activity over the past three days. She has a history of seizures and have been told is nothing that can be done for these. With that being said I do believe that along with the  seizures have the nausea vomiting which upon further questioning doesn't seem to be the normal for her and makes me concerned for the possibility of infection or something else going on. I discussed this with the patient and her mother during the office visit today. I do not feel the wound is effective but maybe something else. The responses this was "this just happens to her at times and we don't know why". They did not seem to be interested in going to the hospital to have this checked out further. 10/09/18 on evaluation today patient presents for follow-up concerning her ongoing toe ulcer. She has been tolerating the dressing changes without complication. Fortunately there does not appear to be any evidence of infection which is great news however I do think that the patient would benefit from going ahead for with the total contact cast. She's actually in a wheelchair today she tells me that she will use her walker if we initiate the cast. I was very specific about the fact that if we were gonna do the cast I wanted to make sure that she was using the walker in order to prevent any falls. She tells me she does not have stairs that she has to traverse on a regular basis at her home. She has not had any seizures since last week again that something that happens to her often she tells me she did talk to [employer] and they said that it may take up to three weeks to get the brace approved for her. Hopefully that will not take that long but nonetheless in the meantime I do think the cast could be of benefit. 10/12/18 on evaluation today patient appears to be doing rather well in regard to her toe ulcer. It's just been a few days and already this is significantly improved both as far as overall appearance and size. Fortunately there's no sign of infection. She is here for her first obligatory cast change. 10/19/18 Seen today for follow up and management of left great toe ulcer. Wound continues to show  improvement. Noted small open area with seroussang drainage with palpation. Denies any increased pain or recent fevers during visit. She will continue calcium alginate  with offloading shoe. Denies any questions or concerns during visit. 10/26/18 on evaluation today patient appears to be doing about the same as when I last saw her in regard to her wound bed. Fortunately there does not appear to be any signs of infection. Unfortunately she continues to have a breakdown in regard to the toe region any time that she is not in the cast. It takes almost no time at all for this to happen. Nonetheless she still has not heard anything from the brace being made by [employer] as to when exactly this will be available to her. Fortunately there is no signs of infection at this time. 10/30/18 on evaluation today patient presents for application of the total contact cast as we just received him this morning. Fortunately we are gonna be able to apply this to her today which is great news. She continues to have no significant pain which is good news. Overall I do feel like things have been improving while she was the cast is when she doesn't have a cast that things get worse. She still has not really heard anything from [facility] regarding her brace. 11/02/18 upon evaluation today patient's wound already appears to be doing significantly better which is good news. Fortunately there does not appear to be any signs of infection also good news. Overall I do think the total contact cast as before is helping to heal this area unfortunately it's just not gonna likely keep the area closed and healed without her getting her brace at least. Again the foot drop is a significant issue for her. 11/09/18 on evaluation today patient appears to be doing excellent in regard to her toe ulcer which in fact is completely healed. Fortunately we finally got the situation squared away with the paperwork which was needed to proceed with getting her  brace approved by Medicaid. I have filled that out unfortunately that information has been sent to the orthopedic office that I worked at 2 1/2 years ago and not tired Current wound care measures. Fortunately she seems to be doing very well at this time. 11/23/18 on evaluation today patient appears to be doing More Poorly Compared to Last Time I Saw Her. At [facility] She Had Completely Healed. Currently she is continuing to have issues with reopening. She states that she just found out that the brace was approved through Medicaid now she just has to go get measured in order to have this fitted for her and then made. Subsequently she does not have an appointment for this yet that is going to complicate things we obviously cannot put her back in the cast if we do not have everything measured because they're not gonna be able to measure her foot while she is in the cast. 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 Obviously this is unfortunate and does have me somewhat worried as well. 11/30/18 on evaluation today patient's toe ulcer actually appears to be doing fairly well. The good news is she will be getting her brace in the shoes next week on Wednesday. Hopefully we will be able to get this to heal without having to go back in the cast however she may need the cast in order to get the wound completely heal and then go from there. Fortunately there's no signs of infection at this time. 12/07/18 on evaluation today patient fortunately did receive her brace  and she states she could tell this definitely makes her walk better. With that being said she's been having issues with her toe where she noticed yesterday there was a lot of tissue that was loosing off this appears to be much larger than what it was previous. She also states that her leg has been read putting much across the top of her foot just about the  ankle although this seems to be receiving somewhat. The total area is still red and appears to be someone infected as best I can tell. She is previously taken Bactrim and that may be a good option for her today as well. We are gonna see what I wound culture shows as well and I think that this is definitely appropriate. With that being said outside of the culture I still need to initiate something in the interim and that's what I'm gonna go ahead and select Bactrim is a good option for her. 12/14/18 on evaluation today patient appears to be doing better in regard to her left great toe ulcer as compared to last week's evaluation. There's still some erythema although this is significantly improved which is excellent news. Overall I do believe that she is making good progress is still gonna take some time before she is where I would like her to be from the standpoint of being able to place her back into the total contact cast. Hopefully we will be where we need to be by next week. 12/21/18 on evaluation today patient actually appears to be doing poorly in regard to her toe ulcer. She's been tolerating the dressing changes without complication. Fortunately there's no signs of systemic infection although she does have a lot of drainage from the toe ulcer and this does seem to be causing some issues at this point. She does have erythema on the distal portion of her toe that appears to be likely cellulitis. 12/28/18 on evaluation today patient actually appears to be doing a little better in my pinion in regard to her toe ulcer. With that being said she still does have some evidence of infection at this time and for her culture she had both E. coli as well as enterococcus as organisms noted on evaluation. For that reason I think that though the Keflex likely has treated the E. coli rather well this has really done nothing for the enterococcus. We are going to have to initiate treatment for this  specifically. [name], [name] ([id number]) 01/04/19 on evaluation today patient's toe actually appears to be doing better from the standpoint of infection. She currently would like to see about putting the cash back on I think that this is appropriate as long as she takes care of it and keeps it from getting wet. She is gonna have some drainage we can definitely pass this up with Drawtex and alginate to try to prevent as much drainage as possible from causing the problems. With that being said I do want to at least try her with the cast between now and Tuesday. If there any issues we can't continue to use it then I will discontinue the use of the cast at that point. 01/08/19 on evaluation today patient actually appears to be doing very well as far as her foot ulcer specifically the great toe on the left is concerned. She did have an area of rubbing on the medial aspect of her left ankle which again is from the cast. Fortunately there's no signs of infection at this point in this  appears to be a very slight skin breakdown. The patient tells me she felt it rubbing but didn't think it was that bad. Fortunately there is no signs of active infection at this time which is good news. No fevers, chills, nausea, or vomiting noted at this time. 01/15/19 on evaluation today patient actually appears to be doing well in regard to her toe ulcer. Again as previous she seems to do well and she has the cast on which indicates to me that during the time she doesn't have a cast on she's putting way too much pressure on this region. Obviously I think that's gonna be an issue as with the current national emergency concerning the Covid-19 Virus it has been recommended that we discontinue the use of total contact casting by the chief medical officer of our company, Dr. Simona Huh. The reasoning is that if a patient becomes sick and cannot come into have the cast removed they could not just leave this on for an additional two  weeks. Obviously the hospitals also do not want to receive patient's who are sick into the emergency department to potentially contaminate the region and spread the Covid-19 Virus among other sick individuals within the hospital system. Therefore at this point we are suspending the use of total contact cast until the current emergency subsides. This was all discussed with the patient today as well. 01/22/19 on evaluation today patient's wound on her left great toe appears to be doing slightly worse than previously noted last week. She tells me that she has been on this quite a bit in fact she tells me she's been awake for 38 straight hours. This is due to the fact that she's having to care for grandparents because nobody else will. She has been taking care of them for five the last seven days since I've seen her they both have dementia his is from a stroke and her grandmother's was progressive. Nonetheless she states even her mom who knows her condition and situation has only help two of those days to take care of them she's been taking care of the rest. Fortunately there does not appear to be any signs of active infection in regard to her toe at this point although obviously it doesn't look as good as it did previous. I think this is directly related to her not taking off the pressure and friction by way of taking things easy. Though I completely understand what's going on. 01/29/19 on evaluation today patient's tools are actually appears to be showing some signs of improvement today compared to last week's evaluation as far as not necessarily the overall size of the wound but the fact that she has some new skin growth in between the two ends of the wound opening. Overall I feel like she has done well she states that she had a family member give her what sounds to be a CAM walker boot which has been helpful as well. 02/05/19 on evaluation today patient's wound bed actually appears to be doing  significantly better in regard to her overall appearance of the size of the wound. With that being said she is still having an issue with offloading efficiently enough to get this to close. Apparently there is some signs of infection at this point as well unfortunately. Previously she's done well of Augmentin I really do not see anything that needs to be culture currently but there theme and cellulitis of the foot that I'm seeing I'm gonna go ahead and place her on  an antibiotic today to try to help clear this up. 02/12/2019 on evaluation today patient actually appears to be doing poorly in regard to her overall wound status. She tells me she has been using her offloading shoe but actually comes in today wearing her tennis shoe with the AFO brace. Again as I previously discussed with her this is really not sufficient to allow the area to heal appropriately. Nonetheless she continues to be somewhat noncompliant and I do wonder based on what she has told my nurse in the past as to whether or not a good portion of this noncompliance may be recreational drug and alcohol related. She has had a history of heroin overdose and this was fairly recently in the past couple of months that have been seeing her. Nonetheless overall I feel like her wound looks significantly worse today compared to what it was previous. She still has significant erythema despite the Augmentin I am not sure that this is an appropriate medication for her infection I am also concerned that the infection may have gone down into her bone. 02/19/19 on evaluation today patient actually appears to be doing about the same in regard to her toe ulcer. Unfortunately she continues to show signs of bone exposure and infection at this point. There does not appear to be any evidence of worsening of the infection but I'm also not really sure that it's getting significantly better. She is on the Augmentin which should be sufficient for the Staphylococcus  aureus infection that she has at this point. With that being said she may need IV antibiotics to more appropriately treat this. We did have a discussion today about hyperbaric option therapy. 02/28/19 on evaluation today patient actually appears to be doing much worse in regard to the wound on her left great toe as compared to even my previous evaluation last week. Unfortunately this seems to be training in a pretty poor direction. Her toe was actually now starting to angle laterally and I can actually see the entire joint area of the proximal portion of the digit where is the distal portion of the digit again is no longer even in contact with the joint line. Unfortunately there's a lot more necrotic tissue around the edge and the toe appears to be showing signs of becoming gangrenous in my pinion. I'm very concerned about were things stand at this point. She did see infectious disease and they are planning to send in a prescription for Sivextro for her and apparently this has been approved. With that being said I don't think she should avoid taking this but at the same time I'm not sure that it's gonna be sufficient to save her toe at this point. 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 03/05/19 on evaluation today patient actually appears to be doing quite well in regard to her toe all things considering. She still has bone exposed but there appears to be much less your thing on overall the appearance of the wound and the toe itself is dramatically improved. She still does have some issues currently obviously with infection she did see vascular as well and there concerned that her blood flow to the toad. For that reason they are setting up for an angiogram next week. 03/14/19 on evaluation today  patient appears to be doing very poor in regard to her toe and specifically in regard to the ulceration and the fact that she's starting to notice the toe was leaning even more towards the lateral aspect and the complete joint is visible on the proximal aspect of the joint. Nonetheless she's also noted a significant odor and the tip of the toe is turning more dark and necrotic appearing. Overall I think she is getting worse not better as far as this is concerned. For that reason I am recommending at this point that she likely needs to be seen for likely amputation. READMISSION 03/19/2021 This is a patient that we cared for in this clinic for a prolonged period of time in 2019 and 2020 with a left foot and left first toe wound. 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 ([id number]) 04/09/20 by Dr. Luana Shu. In December 2021 she had an ulcer on her right great toe as well as the fourth and fifth toes. 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 She also had an angiogram at that time and underwent angioplasty of the right anterior tibial artery. In any case she claims that the wound on the right foot is closed I did not look at this today which was probably an oversight although I think that should be done next week. After her surgery she developed a dehiscence but I do not see any follow-up of this. According to Dr. Deborra Medina last review that she was out of the area being cared for by another physician but recently came back to his attention. 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 A culture of this area showed E. coli apparently before she came back to see Dr. Luana Shu she was supposed to be receiving antibiotics but she did not really take them. Nor is she offloading this area at all. Finally her last hemoglobin A1c listed in epic was in  March 2022 at 14.1 she says things are a lot better since then although I am not sure. 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 She had this wound at the time. During this admission she had E. coli sepsis a TEE was negative. 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 She is still a heavy cigarette smoker. She has a history of bipolar disorder seizure disorder ABI in our clinic was 1.05 6/1; left midfoot in the setting of a TMA done previously. Round circular wound with a "knuckle" of protruding tissue. The problem is that the knuckle was not attached to any of the surrounding granulation and this probed proximally widely I removed a large portion of this tissue. This wound goes with considerable undermining laterally. I do not feel any bone there was no purulence but this is a deep wound. 6/8; in spite of the debridement I did last week. She arrives with a wound looking exactly the same. A protruding "knuckle" of tissue nonadherent to most of the surrounding tissue. There is considerable depth around this from 6-12 o'clock at 2.7 cm and undermining of 1 cm. This does not look overtly  infected and the x-ray I did last week was negative for any osseous abnormalities. We have been using silver collagen 6/15; deep tissue culture I did last week showed moderate staph aureus and moderate Pseudomonas. This will definitely require prolonged antibiotic therapy. The pathology on the protuberant area was negative for malignancy fungus etc. 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 We have been using silver  collagen. I am going to be prescribing Levaquin for 2 weeks. Her CT scan of the foot is down for 7/5 6/22; CT scan of the foot on 7 5. She says she has hardware in the left leg from her previous fracture. She is on the Levaquin for the deep tissue culture I did that showed methicillin sensitive staph aureus and Pseudomonas. I gave her a 2-week supply and she will have another week. She arrives in clinic today with the same protuberant tissue however this is nonadherent to the tissue surrounding it. I am really at a loss to explain this unless there is underlying deep tissue infection Objective Constitutional Sitting or standing Blood Pressure is within target range for patient.. Pulse regular and within target range for patient.Marland Kitchen Respirations regular, non- labored and within target range.. Temperature is normal and within the target range for the patient.Marland Kitchen appears in no distress. Vitals Time Taken: 1:10 PM, Height: 69 in, Weight: 185 lbs, BMI: 27.3, Temperature: 98.2 F, Pulse: 94 bpm, Respiratory Rate: 18 breaths/min, Blood Pressure: 113/75 mmHg. General Notes: Wound exam; really no different here. Thick skin around this wound which appears somewhat macerated I remove this with a #5 curette debris on the wound surface I also removed. Again the same protruding tissue that is nonadherent and probes widely around it. There is really no evidence at this point of surrounding infection. Integumentary (Hair, Skin) Wound #7 status is Open. Original cause of wound was Gradually Appeared. The date acquired was: 08/07/2020. The wound has been in treatment 3 weeks. The wound is located on the [place]. The wound measures 1.8cm length x 2cm width x 1.2cm depth; 2.827cm^2 area and 3.393cm^3 volume. There is Fat Layer (Subcutaneous Tissue) exposed. Tunneling has been noted at 6:00 with a maximum distance of 0.5cm. Undermining begins at 1:00 and ends at 3:00 with a maximum distance of 0.3cm.  There is a medium amount of serosanguineous drainage noted. The wound margin is epibole. There is medium (34-66%) pink granulation within the wound bed. There is a medium (34-66%) amount of necrotic tissue within the wound bed including Adherent Slough. Assessment Active Problems ICD-10 Type 2 diabetes mellitus with foot ulcer 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ity Type 2 diabetes mellitus with diabetic polyneuropathy [name], [name] ([id number]) Procedures Wound #7 Pre-procedure diagnosis of Wound #7 is a Pressure Ulcer located on the Left,Midline,Plantar Foot . There was a Excisional Skin/Subcutaneous Tissue Debridement with a total area of 3.6 sq cm performed by [name], MD. With the following instrument(s): Curette to remove Viable and Non-Viable tissue/material. Material removed includes Callus, Subcutaneous Tissue, and Skin: Epidermis. A time out was conducted at 13:35, prior to the start of the procedure. A Minimum amount of bleeding was controlled with Pressure. The procedure was tolerated well. Post Debridement Measurements: 1.8cm length x 2cm width x 1.3cm depth; 3.676cm^3 volume. Post debridement Stage noted as Category/Stage II. Character of Wound/Ulcer Post Debridement is stable. Post procedure Diagnosis Wound #7: Same as Pre-Procedure Plan Follow-up Appointments: Return Appointment in  1 week. Bathing/ Shower/ Hygiene: Clean wound with Normal Saline or wound cleanser. Off-Loading: Open toe surgical shoe with peg assist. Medications-Please add to medication list.: P.O. Antibiotics - Levaquin WOUND #7: - Foot Wound Laterality: Plantar, Left, Midline Cleanser: Soap and Water 3 x Per Week/30 Days Discharge Instructions: Gently cleanse wound with antibacterial soap, rinse and pat dry prior to dressing wounds Primary Dressing: Prisma 4.34 (in) 3 x Per Week/30 Days Discharge Instructions: Moisten with hydrogel-tuck in  undermining area Secondary Dressing: Bordered Gauze Sterile-HBD 4x4 (in/in) 3 x Per Week/30 Days Discharge Instructions: Cover wound with Bordered Guaze Sterile as directed 1. The patient is continuing the Levaquin for another week. Consider renewing this if she is tolerating it. 2. CT scan of the foot on 7/5 question is underlying infection, osteomyelitis 3. Biopsy of the protruding tissue did not show malignancy fungus etc. 4. She is essentially nonambulatory and claims not to be putting any pressure on this. 5. The patient does not have/Medicaid not a large number of wound care products to choose from Electronic Signature(s) Signed: 04/14/2021 4:26:18 PM By: Heather Ham MD Entered By: [name] on 04/14/2021 13:46:08 [name] ([id number]) -------------------------------------------------------------------------------- [place] Details Patient Name: Heather Dear A. Date of Service: 04/14/2021 Medical Record Number: [id number] Patient Account Number: [id number] Date of Birth/Sex: 05-Feb-1976 (45 y.o. F) Treating [name]: [name] Primary Care Provider: Raelene [name] Other Clinician: Referring Provider: Raelene [name] Treating Provider/Extender: [name] in Treatment: 3 Diagnosis Coding ICD-10 Codes Code Description E11.621 Type 2 diabetes mellitus with foot ulcer 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ity E11.42 Type 2 diabetes mellitus with diabetic polyneuropathy Facility Procedures CPT4 Code: [id number] Description: [id number] - DEB SUBQ TISSUE 20 SQ CM/< Modifier: Quantity: 1 CPT4 Code: Description: ICD-10 Diagnosis Description 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 Modifier: severity Quantity: Physician Procedures CPT4 Code: [id number] Description: 11042 - WC PHYS SUBQ TISS 20 SQ CM Modifier: Quantity: 1 CPT4 Code: Description: ICD-10 Diagnosis  Description 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 Modifier: severity Quantity: Electronic Signature(s) Signed: 04/14/2021 4:26:18 PM By: Heather Ham MD Entered By: [name] on 04/14/2021 13:45:39

## 2021-04-15 NOTE — Progress Notes (Signed)
AIMI, CASASANTA ([id number]) Visit Report for 04/14/2021 Arrival Information Details Patient Name: [name], [name] Date of Service: 04/14/2021 12:45 PM Medical Record Number: [id number] Patient Account Number: [id number] Date of Birth/Sex: February 08, 1976 (45 y.o. F) Treating RN: Dolan Amen Primary Care Maxamillian Tienda: Raelene Bott Other Clinician: Referring Alekai Pocock: Raelene Bott Treating Allayna Erlich/Extender: Tito Dine in Treatment: 3 Visit Information History Since Last Visit Added or deleted any medications: No Patient Arrived: Wheel Chair Had a fall or experienced change in No Arrival Time: 13:06 activities of daily living that may affect Accompanied By: mother risk of falls: Transfer Assistance: None Hospitalized since last visit: No Patient Identification Verified: Yes Pain Present Now: Yes Secondary Verification Process Completed: Yes Patient Requires Transmission-Based No Precautions: Patient Has Alerts: Yes Patient Alerts: Patient on Blood Thinner '81mg'$  apirin Diabetic Type II Electronic Signature(s) Signed: 04/15/2021 4:21:06 PM By: Jeanine Luz Entered By: Jeanine Luz on 04/14/2021 13:09:17 [name] ([id number]) -------------------------------------------------------------------------------- Clinic Level of Care Assessment Details Patient Name: Heather Dear A. Date of Service: 04/14/2021 12:45 PM Medical Record Number: [id number] Patient Account Number: [id number] Date of Birth/Sex: 1976-01-20 (45 y.o. F) Treating RN: Dolan Amen Primary Care Abdulah Iqbal: Raelene Bott Other Clinician: Referring Rithvik Orcutt: Raelene Bott Treating Lasharn Bufkin/Extender: Tito Dine in Treatment: 3 Clinic Level of Care Assessment Items TOOL 1 Quantity Score '[]'$  - Use when EandM and Procedure is performed on INITIAL visit 0 ASSESSMENTS - Nursing Assessment / Reassessment '[]'$  - General Physical Exam (combine w/ comprehensive assessment (listed just  below) when performed on new 0 pt. evals) '[]'$  - 0 Comprehensive Assessment (HX, ROS, Risk Assessments, Wounds Hx, etc.) ASSESSMENTS - Wound and Skin Assessment / Reassessment '[]'$  - Dermatologic / Skin Assessment (not related to wound area) 0 ASSESSMENTS - Ostomy and/or Continence Assessment and Care '[]'$  - Incontinence Assessment and Management 0 '[]'$  - 0 Ostomy Care Assessment and Management (repouching, etc.) PROCESS - Coordination of Care '[]'$  - Simple Patient / Family Education for ongoing care 0 '[]'$  - 0 Complex (extensive) Patient / Family Education for ongoing care '[]'$  - 0 Staff obtains Consents, Records, Test Results / Process Orders '[]'$  - 0 Staff telephones HHA, Nursing Homes / Clarify orders / etc '[]'$  - 0 Routine Transfer to another Facility (non-emergent condition) '[]'$  - 0 Routine Hospital Admission (non-emergent condition) '[]'$  - 0 New Admissions / Biomedical engineer / Ordering NPWT, Apligraf, etc. '[]'$  - 0 Emergency Hospital Admission (emergent condition) PROCESS - Special Needs '[]'$  - Pediatric / Minor Patient Management 0 '[]'$  - 0 Isolation Patient Management '[]'$  - 0 Hearing / Language / Visual special needs '[]'$  - 0 Assessment of Community assistance (transportation, D/C planning, etc.) '[]'$  - 0 Additional assistance / Altered mentation '[]'$  - 0 Support Surface(s) Assessment (bed, cushion, seat, etc.) INTERVENTIONS - Miscellaneous '[]'$  - External ear exam 0 '[]'$  - 0 Patient Transfer (multiple staff / Civil Service fast streamer / Similar devices) '[]'$  - 0 Simple Staple / Suture removal (25 or less) '[]'$  - 0 Complex Staple / Suture removal (26 or more) '[]'$  - 0 Hypo/Hyperglycemic Management (do not check if billed separately) '[]'$  - 0 Ankle / Brachial Index (ABI) - do not check if billed separately Has the patient been seen at the hospital within the last three years: Yes Total Score: 0 Level Of Care: ____ [name] ([id number]) Electronic Signature(s) Signed: 04/14/2021 4:33:50 PM By:  Georges Mouse, Minus Breeding RN Entered By: Georges Mouse, Kenia on 04/14/2021 13:39:57 [name] ([id number]) -------------------------------------------------------------------------------- Encounter Discharge Information Details Patient Name:  [name], [name] Date of Service: 04/14/2021 12:45 PM Medical Record Number: [id number] Patient Account Number: [id number] Date of Birth/Sex: [birth year] (45 y.o. F) Treating RN: Dolan Amen Primary Care Halo Laski: Raelene Bott Other Clinician: Referring Amy Gothard: Raelene Bott Treating Dylyn Mclaren/Extender: Tito Dine in Treatment: 3 Encounter Discharge Information Items Post Procedure Vitals Discharge Condition: Stable Temperature (F): 98.2 Ambulatory Status: Wheelchair Pulse (bpm): 94 Discharge Destination: Home Respiratory Rate (breaths/min): 18 Transportation: Private Auto Blood Pressure (mmHg): 113/75 Accompanied By: mother Schedule Follow-up Appointment: Yes Clinical Summary of Care: Electronic Signature(s) Signed: 04/15/2021 4:21:06 PM By: Jeanine Luz Entered By: Jeanine Luz on 04/14/2021 14:03:30 [name] ([id number]) -------------------------------------------------------------------------------- Lower Extremity Assessment Details Patient Name: Heather Dear A. Date of Service: 04/14/2021 12:45 PM Medical Record Number: [id number] Patient Account Number: [id number] Date of Birth/Sex: August 22, 1976 (45 y.o. F) Treating RN: Dolan Amen Primary Care Omere Marti: Raelene Bott Other Clinician: Referring Mate Alegria: Raelene Bott Treating Jaray Boliver/Extender: Tito Dine in Treatment: 3 Electronic Signature(s) Signed: 04/14/2021 4:33:50 PM By: Georges Mouse, Minus Breeding RN Signed: 04/15/2021 4:21:06 PM By: Jeanine Luz Entered By: Jeanine Luz on 04/14/2021 13:20:21 [name] ([id number]) -------------------------------------------------------------------------------- Multi Wound  Chart Details Patient Name: Heather Dear A. Date of Service: 04/14/2021 12:45 PM Medical Record Number: [id number] Patient Account Number: [id number] Date of Birth/Sex: [birth year] (45 y.o. F) Treating RN: Dolan Amen Primary Care Dotti Busey: Raelene Bott Other Clinician: Referring Juliauna Stueve: Raelene Bott Treating Danielle Lento/Extender: Tito Dine in Treatment: 3 Vital Signs Height(in): 69 Pulse(bpm): 94 Weight(lbs): 185 Blood Pressure(mmHg): 113/75 Body Mass Index(BMI): 27 Temperature(F): 98.2 Respiratory Rate(breaths/min): 18 Photos: [N/A:N/A] Wound Location: Left, Midline, Plantar Foot N/A N/A Wounding Event: Gradually Appeared N/A N/A Primary Etiology: Pressure Ulcer N/A N/A Comorbid History: Chronic sinus problems/congestion, N/A N/A Middle ear problems, Anemia, Chronic Obstructive Pulmonary Disease (COPD), Congestive Heart Failure, Type II Diabetes, End Stage Renal Disease, History of pressure wounds, Neuropathy Date Acquired: 08/07/2020 N/A N/A Weeks of Treatment: 3 N/A N/A Wound Status: Open N/A N/A Measurements L x W x D (cm) 1.8x2x1.2 N/A N/A Area (cm) : 2.827 N/A N/A Volume (cm) : 3.393 N/A N/A % Reduction in Area: -5.90% N/A N/A % Reduction in Volume: -154.20% N/A N/A Position 1 (o'clock): 6 Maximum Distance 1 (cm): 0.5 Starting Position 1 (o'clock): 1 Ending Position 1 (o'clock): 3 Maximum Distance 1 (cm): 0.3 Tunneling: Yes N/A N/A Undermining: Yes N/A N/A Classification: Category/Stage II N/A N/A Exudate Amount: Medium N/A N/A Exudate Type: Serosanguineous N/A N/A Exudate Color: red, brown N/A N/A Wound Margin: Epibole N/A N/A Granulation Amount: Medium (34-66%) N/A N/A Granulation Quality: Pink N/A N/A Necrotic Amount: Medium (34-66%) N/A N/A Exposed Structures: Fat Layer (Subcutaneous Tissue): N/A N/A Yes Fascia: No Tendon: No Muscle: No Joint: No Bone: No Epithelialization: None N/A N/A Debridement: Debridement - Excisional  N/A N/A 13:35 N/A N/A [name], [name] ([id number]) Pre-procedure Verification/Time Out Taken: Tissue Debrided: Callus, Subcutaneous N/A N/A Level: Skin/Subcutaneous Tissue N/A N/A Debridement Area (sq cm): 3.6 N/A N/A Instrument: Curette N/A N/A Bleeding: Minimum N/A N/A Hemostasis Achieved: Pressure N/A N/A Debridement Treatment Procedure was tolerated well N/A N/A Response: Post Debridement 1.8x2x1.3 N/A N/A Measurements L x W x D (cm) Post Debridement Volume: 3.676 N/A N/A (cm) Post Debridement Stage: Category/Stage II N/A N/A Procedures Performed: Debridement N/A N/A Treatment Notes Electronic Signature(s) Signed: 04/14/2021 4:26:18 PM By: Linton Ham MD Entered By: Linton Ham on 04/14/2021 13:41:44 [name] ([id number]) -------------------------------------------------------------------------------- Multi-Disciplinary Care Plan Details Patient Name: Heather Dear A. Date of Service:  04/14/2021 12:45 PM Medical Record Number: [id number] Patient Account Number: [id number] Date of Birth/Sex: Apr 06, 1976 (45 y.o. F) Treating RN: Dolan Amen Primary Care Silvino Selman: Raelene Bott Other Clinician: Referring Madason Rauls: Raelene Bott Treating Abdulahad Mederos/Extender: Tito Dine in Treatment: 3 Active Inactive Abuse / Safety / Falls / Self Care Management Nursing Diagnoses: Potential for injury related to falls Goals: Patient/caregiver will verbalize understanding of skin care regimen Date Initiated: 03/19/2021 Target Resolution Date: 04/19/2021 Goal Status: Active Patient/caregiver will verbalize/demonstrate measures taken to prevent injury and/or falls Date Initiated: 03/19/2021 Target Resolution Date: 04/19/2021 Goal Status: Active Interventions: Assess Activities of Daily Living upon admission and as needed Assess fall risk on admission and as needed Assess: immobility, friction, shearing, incontinence upon admission and as needed Assess  impairment of mobility on admission and as needed per policy Assess personal safety and home safety (as indicated) on admission and as needed Assess self care needs on admission and as needed Notes: Necrotic Tissue Nursing Diagnoses: Impaired tissue integrity related to necrotic/devitalized tissue Knowledge deficit related to management of necrotic/devitalized tissue Goals: Necrotic/devitalized tissue will be minimized in the wound bed Date Initiated: 04/07/2021 Target Resolution Date: 04/07/2021 Goal Status: Active Patient/caregiver will verbalize understanding of reason and process for debridement of necrotic tissue Date Initiated: 04/07/2021 Target Resolution Date: 04/07/2021 Goal Status: Active Interventions: Assess patient pain level pre-, during and post procedure and prior to discharge Provide education on necrotic tissue and debridement process Treatment Activities: Excisional debridement : 04/07/2021 Notes: Wound/Skin Impairment Nursing Diagnoses: Knowledge deficit related to ulceration/compromised skin integrity Goals: Patient/caregiver will verbalize understanding of skin care regimen [name], [name] ([id number]) Date Initiated: 03/19/2021 Target Resolution Date: 04/19/2021 Goal Status: Active Ulcer/skin breakdown will have a volume reduction of 30% by week 4 Date Initiated: 03/19/2021 Target Resolution Date: 04/19/2021 Goal Status: Active Ulcer/skin breakdown will have a volume reduction of 50% by week 8 Date Initiated: 03/19/2021 Target Resolution Date: 05/19/2021 Goal Status: Active Ulcer/skin breakdown will have a volume reduction of 80% by week 12 Date Initiated: 03/19/2021 Target Resolution Date: 06/19/2021 Goal Status: Active Ulcer/skin breakdown will heal within 14 weeks Date Initiated: 03/19/2021 Target Resolution Date: 07/20/2021 Goal Status: Active Interventions: Assess patient/caregiver ability to obtain necessary supplies Assess patient/caregiver ability to  perform ulcer/skin care regimen upon admission and as needed Assess ulceration(s) every visit Notes: Electronic Signature(s) Signed: 04/14/2021 4:33:50 PM By: Georges Mouse, Minus Breeding RN Entered By: Georges Mouse, Minus Breeding on 04/14/2021 13:35:36 [name] ([id number]) -------------------------------------------------------------------------------- Pain Assessment Details Patient Name: Heather Dear A. Date of Service: 04/14/2021 12:45 PM Medical Record Number: [id number] Patient Account Number: [id number] Date of Birth/Sex: [birth year] (45 y.o. F) Treating RN: Dolan Amen Primary Care Bernese Doffing: Raelene Bott Other Clinician: Referring Taryn Shellhammer: Raelene Bott Treating Tiajah Oyster/Extender: Tito Dine in Treatment: 3 Active Problems Location of Pain Severity and Description of Pain Patient Has Paino Yes Site Locations Rate the pain. Current Pain Level: 8 Pain Management and Medication Current Pain Management: Electronic Signature(s) Signed: 04/14/2021 4:33:50 PM By: Georges Mouse, Minus Breeding RN Signed: 04/15/2021 4:21:06 PM By: Jeanine Luz Entered By: Jeanine Luz on 04/14/2021 13:13:35 [name] ([id number]) -------------------------------------------------------------------------------- Patient/Caregiver Education Details Patient Name: Heather Dear A. Date of Service: 04/14/2021 12:45 PM Medical Record Number: [id number] Patient Account Number: [id number] Date of Birth/Gender: 04-06-1976 (45 y.o. F) Treating RN: Dolan Amen Primary Care Physician: Raelene Bott Other Clinician: Referring Physician: Raelene Bott Treating Physician/Extender: Tito Dine in Treatment: 3 Education Assessment Education Provided To: Patient Education Topics Provided Wound Debridement:  Methods: Explain/Verbal Responses: State content correctly Wound/Skin Impairment: Methods: Explain/Verbal Responses: State content correctly Electronic  Signature(s) Signed: 04/14/2021 4:33:50 PM By: Georges Mouse, Minus Breeding RN Entered By: Georges Mouse, Kenia on 04/14/2021 13:40:12 [name] ([id number]) -------------------------------------------------------------------------------- Wound Assessment Details Patient Name: Heather Dear A. Date of Service: 04/14/2021 12:45 PM Medical Record Number: [id number] Patient Account Number: [id number] Date of Birth/Sex: 1976-01-21 (45 y.o. F) Treating RN: Dolan Amen Primary Care Zaiah Eckerson: Raelene Bott Other Clinician: Referring Immanuel Fedak: Raelene Bott Treating Coal Nearhood/Extender: Tito Dine in Treatment: 3 Wound Status Wound Number: 7 Primary Pressure Ulcer Etiology: Wound Location: Left, Midline, Plantar Foot Wound Open Wounding Event: Gradually Appeared Status: Date Acquired: 08/07/2020 Comorbid Chronic sinus problems/congestion, Middle ear problems, Weeks Of Treatment: 3 History: Anemia, Chronic Obstructive Pulmonary Disease (COPD), Clustered Wound: No Congestive Heart Failure, Type II Diabetes, End Stage Renal Disease, History of pressure wounds, Neuropathy Photos Wound Measurements Length: (cm) 1.8 % Width: (cm) 2 % Depth: (cm) 1.2 E Area: (cm) 2.827 Volume: (cm) 3.393 Reduction in Area: -5.9% Reduction in Volume: -154.2% pithelialization: None Tunneling: Yes Position (o'clock): 6 Maximum Distance: (cm) 0.5 Undermining: Yes Starting Position (o'clock): 1 Ending Position (o'clock): 3 Maximum Distance: (cm) 0.3 Wound Description Classification: Category/Stage II Wound Margin: Epibole Exudate Amount: Medium Exudate Type: Serosanguineous Exudate Color: red, brown Foul Odor After Cleansing: No Slough/Fibrino Yes Wound Bed Granulation Amount: Medium (34-66%) Exposed Structure Granulation Quality: Pink Fascia Exposed: No Necrotic Amount: Medium (34-66%) Fat Layer (Subcutaneous Tissue) Exposed: Yes Necrotic Quality: Adherent Slough Tendon  Exposed: No Muscle Exposed: No Joint Exposed: No Bone Exposed: No [name], [name] ([id number]) Treatment Notes Wound #7 (Foot) Wound Laterality: Plantar, Left, Midline Cleanser Soap and Water Discharge Instruction: Gently cleanse wound with antibacterial soap, rinse and pat dry prior to dressing wounds Peri-Wound Care Topical Primary Dressing Prisma 4.34 (in) Discharge Instruction: Moisten with hydrogel-tuck in undermining area Secondary Dressing Bordered Gauze Sterile-HBD 4x4 (in/in) Discharge Instruction: Cover wound with Bordered Guaze Sterile as directed Secured With Compression Wrap Compression Stockings Add-Ons Electronic Signature(s) Signed: 04/14/2021 4:33:50 PM By: Georges Mouse, Minus Breeding RN Signed: 04/15/2021 4:21:06 PM By: Jeanine Luz Entered By: Jeanine Luz on 04/14/2021 13:20:04 [name] ([id number]) -------------------------------------------------------------------------------- Vitals Details Patient Name: Heather Dear A. Date of Service: 04/14/2021 12:45 PM Medical Record Number: [id number] Patient Account Number: [id number] Date of Birth/Sex: 04-04-1976 (45 y.o. F) Treating RN: Dolan Amen Primary Care Pearse Shiffler: Raelene Bott Other Clinician: Referring Glendene Wyer: Raelene Bott Treating Jemaine Prokop/Extender: Tito Dine in Treatment: 3 Vital Signs Time Taken: 13:10 Temperature (F): 98.2 Height (in): 69 Pulse (bpm): 94 Weight (lbs): 185 Respiratory Rate (breaths/min): 18 Body Mass Index (BMI): 27.3 Blood Pressure (mmHg): 113/75 Reference Range: 80 - 120 mg / dl Electronic Signature(s) Signed: 04/15/2021 4:21:06 PM By: Jeanine Luz Entered By: Jeanine Luz on 04/14/2021 13:13:27

## 2021-04-21 ENCOUNTER — Encounter (HOSPITAL_BASED_OUTPATIENT_CLINIC_OR_DEPARTMENT_OTHER): Payer: Medicaid Other | Admitting: Internal Medicine

## 2021-04-21 ENCOUNTER — Other Ambulatory Visit: Payer: Self-pay

## 2021-04-21 DIAGNOSIS — L97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ity: Secondary | ICD-10-CM

## 2021-04-21 DIAGNOSIS — E11621 Type 2 diabetes mellitus with foot ulcer: Secondary | ICD-10-CM | POA: Diagnosis not present

## 2021-04-21 DIAGNOSIS — E1142 Type 2 diabetes mellitus with diabetic polyneuropathy: Secondary | ICD-10-CM | POA: Diagnosis not present

## 2021-04-27 ENCOUNTER — Ambulatory Visit
Admission: RE | Admit: 2021-04-27 | Discharge: 2021-04-27 | Disposition: A | Discharge status: Home / Self Care | Payer: Medicaid Other | Source: Ambulatory Visit | Attending: Internal Medicine | Admitting: Internal Medicine

## 2021-04-27 ENCOUNTER — Other Ambulatory Visit: Payer: Self-pay

## 2021-04-27 DIAGNOSIS — L97509 Non-pressure chronic ulcer of other part of unspecified foot with unspecified severity: Secondary | ICD-10-CM | POA: Diagnosis present

## 2021-04-27 DIAGNOSIS — E11621 Type 2 diabetes mellitus with foot ulcer: Secondary | ICD-10-CM | POA: Diagnosis present

## 2021-04-27 DIAGNOSIS — L97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ity: Secondary | ICD-10-CM | POA: Insufficient documentation

## 2021-04-28 ENCOUNTER — Encounter: Payer: Medicaid Other | Attending: Internal Medicine | Admitting: Internal Medicine

## 2021-04-28 DIAGNOSIS — D649 Anemia, unspecified: Secondary | ICD-10-CM | POA: Diagnosis not present

## 2021-04-28 DIAGNOSIS — E1122 Type 2 diabetes mellitus with diabetic chronic kidney disease: Secondary | ICD-10-CM | POA: Diagnosis not present

## 2021-04-28 DIAGNOSIS — Z89422 Acquired absence of other left toe(s): Secondary | ICD-10-CM | POA: Diagnosis not present

## 2021-04-28 DIAGNOSIS — I509 Heart failure, unspecified: Secondary | ICD-10-CM | POA: Diagnosis not present

## 2021-04-28 DIAGNOSIS — F17210 Nicotine dependence, cigarettes, uncomplicated: Secondary | ICD-10-CM | POA: Insufficient documentation

## 2021-04-28 DIAGNOSIS — Z79899 Other long term (current) drug therapy: Secondary | ICD-10-CM | POA: Diagnosis not present

## 2021-04-28 DIAGNOSIS — E11621 Type 2 diabetes mellitus with foot ulcer: Secondary | ICD-10-CM | POA: Diagnosis not present

## 2021-04-28 DIAGNOSIS — E1142 Type 2 diabetes mellitus with diabetic polyneuropathy: Secondary | ICD-10-CM | POA: Insufficient documentation

## 2021-04-28 DIAGNOSIS — L97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ity: Secondary | ICD-10-CM | POA: Diagnosis not present

## 2021-04-28 DIAGNOSIS — I429 Cardiomyopathy, unspecified: Secondary | ICD-10-CM | POA: Diagnosis not present

## 2021-04-28 DIAGNOSIS — G40909 Epilepsy, unspecified, not intractable, without status epilepticus: Secondary | ICD-10-CM | POA: Insufficient documentation

## 2021-04-28 DIAGNOSIS — J449 Chronic obstructive pulmonary disease, unspecified: Secondary | ICD-10-CM | POA: Insufficient documentation

## 2021-04-28 DIAGNOSIS — I132 Hypertensive heart and chronic kidney disease with heart failure and with stage 5 chronic kidney disease, or end stage renal disease: Secondary | ICD-10-CM | POA: Diagnosis not present

## 2021-04-28 DIAGNOSIS — N186 End stage renal disease: Secondary | ICD-10-CM | POA: Insufficient documentation

## 2021-04-28 DIAGNOSIS — Z881 Allergy status to other antibiotic agents status: Secondary | ICD-10-CM | POA: Diagnosis not present

## 2021-04-28 DIAGNOSIS — Z89421 Acquired absence of other right toe(s): Secondary | ICD-10-CM | POA: Diagnosis not present

## 2021-04-28 DIAGNOSIS — Z636 Dependent relative needing care at home: Secondary | ICD-10-CM | POA: Diagnosis not present

## 2021-04-28 NOTE — Progress Notes (Signed)
Heather, [name] ([id number]) Visit Report for 04/28/2021 Arrival Information Details Patient Name: [name], [name] Date of Service: 04/28/2021 1:00 PM Medical Record Number: [id number] Patient Account Number: [id number] Date of Birth/Sex: 03/16/1976 (45 y.o. F) Treating RN: Donnamarie Poag Primary Care Ishika [place]: Raelene Bott Other Clinician: Referring Sophiea Ueda: Raelene Bott Treating Jasan Doughtie/Extender: Yaakov Guthrie in Treatment: 5 Visit Information History Since Last Visit Added or deleted any medications: No Patient Arrived: Wheel Chair Had [name] fall or experienced change in No Arrival Time: 13:08 activities of daily living that may affect Accompanied By: mother risk of falls: Transfer Assistance: EasyPivot Patient Lift Hospitalized since last visit: No Patient Identification Verified: Yes Has Dressing in Place as Prescribed: Yes Secondary Verification Process Completed: Yes Pain Present Now: Yes Patient Requires Transmission-Based No Precautions: Patient Has Alerts: Yes Patient Alerts: Patient on Blood Thinner 45m apirin Diabetic Type II Electronic Signature(s) Signed: 04/28/2021 4:25:48 PM By: BDonnamarie PoagEntered By: BDonnamarie Poagon 04/28/2021 13:10:39 Bachtell, Aija DillonMarland Kitchen([id number] -------------------------------------------------------------------------------- Clinic Level of Care Assessment Details Patient Name: [name] Date of Service: 04/28/2021 1:00 PM Medical Record Number: [id number] Account Number: [id number] of Birth/Sex: 9Dec 11, 1977(45y.o. F) Treating RN: BDonnamarie PoagPrimary Care Ramla Hase: HRaelene BottOther Clinician: Referring Endre Coutts: HRaelene BottTreating Syrai Gladwin/Extender: HYaakov Guthriein Treatment: 5 Clinic Level of Care Assessment Items TOOL 1 Quantity Score [] - Use when EandM and Procedure is performed on INITIAL visit 0 ASSESSMENTS - Nursing Assessment / Reassessment [] - General Physical Exam (combine w/  comprehensive assessment (listed just below) when performed on new 0 pt. evals) [] - 0 Comprehensive Assessment (HX, ROS, Risk Assessments, Wounds Hx, etc.) ASSESSMENTS - Wound and Skin Assessment / Reassessment [] - Dermatologic / Skin Assessment (not related to wound area) 0 ASSESSMENTS - Ostomy and/or Continence Assessment and Care [] - Incontinence Assessment and Management 0 [] - 0 Ostomy Care Assessment and Management (repouching, etc.) PROCESS - Coordination of Care [] - Simple Patient / Family Education for ongoing care 0 [] - 0 Complex (extensive) Patient / Family Education for ongoing care [] - 0 Staff obtains Consents, Records, Test Results / Process Orders [] - 0 Staff telephones HHA, Nursing Homes / Clarify orders / etc [] - 0 Routine Transfer to another Facility (non-emergent condition) [] - 0 Routine Hospital Admission (non-emergent condition) [] - 0 New Admissions / IBiomedical engineer/ Ordering NPWT, Apligraf, etc. [] - 0 Emergency Hospital Admission (emergent condition) PROCESS - Special Needs [] - Pediatric / Minor Patient Management 0 [] - 0 Isolation Patient Management [] - 0 Hearing / Language / Visual special needs [] - 0 Assessment of Community assistance (transportation, D/C planning, etc.) [] - 0 Additional assistance / Altered mentation [] - 0 Support Surface(s) Assessment (bed, cushion, seat, etc.) INTERVENTIONS - Miscellaneous [] - External ear exam 0 [] - 0 Patient Transfer (multiple staff / HCivil Service fast streamer/ Similar devices) [] - 0 Simple Staple / Suture removal (25 or less) [] - 0 Complex Staple / Suture removal (26 or more) [] - 0 Hypo/Hyperglycemic Management (do not check if billed separately) [] - 0 Ankle / Brachial Index (ABI) - do not check if billed separately Has the patient been seen at the hospital within the last three years: Yes Total Score: 0 Level Of Care: ____ SLonzo Candy([id number] Electronic  Signature(s) Signed: 04/28/2021 4:25:48 PM By: BDonnamarie PoagEntered By: BDonnamarie Poagon 04/28/2021 13:43:33 Sethi, Annye [name]. ([id number] -------------------------------------------------------------------------------- Encounter Discharge  Information Details Patient Name: [name], [name] Date of Service: 04/28/2021 1:00 PM Medical Record Number: [id number] Patient Account Number: [id number] Date of Birth/Sex: 1976-02-16 (45 y.o. F) Treating RN: Donnamarie Poag Primary Care : Raelene Bott Other Clinician: Referring : Raelene Bott Treating /Extender: Yaakov Guthrie in Treatment: 5 Encounter Discharge Information Items Post Procedure Vitals Discharge Condition: Stable Temperature (F): 98.3 Ambulatory Status: Wheelchair Pulse (bpm): 112 Discharge Destination: Home Respiratory Rate (breaths/min): 18 Transportation: Private Auto Blood Pressure (mmHg): 100/61 Accompanied By: mother Schedule Follow-up Appointment: Yes Clinical Summary of Care: Electronic Signature(s) Signed: 04/28/2021 4:25:48 PM By: Donnamarie Poag Entered By: Donnamarie Poag on 04/28/2021 13:53:35 Noyce, Ariellah [name]. ([id number]) -------------------------------------------------------------------------------- Lower Extremity Assessment Details Patient Name: Heather Dear [name]. Date of Service: 04/28/2021 1:00 PM Medical Record Number: [id number] Patient Account Number: [id number] Date of Birth/Sex: 05-06-1976 (45 y.o. F) Treating RN: Donnamarie Poag Primary Care : Raelene Bott Other Clinician: Referring : Raelene Bott Treating /Extender: Yaakov Guthrie in Treatment: 5 Edema Assessment Assessed: [Left: Yes] [Right: No] Edema: [Left: N] [Right: o] Electronic Signature(s) Signed: 04/28/2021 4:25:48 PM By: Donnamarie Poag Entered By: Donnamarie Poag on 04/28/2021 13:19:43 [name], [name]  ([id number]) -------------------------------------------------------------------------------- Multi Wound Chart Details Patient Name: Heather Dear [name]. Date of Service: 04/28/2021 1:00 PM Medical Record Number: [id number] Patient Account Number: [id number] Date of Birth/Sex: [birth year] (45 y.o. F) Treating RN: Donnamarie Poag Primary Care : Raelene Bott Other Clinician: Referring : Raelene Bott Treating /Extender: Yaakov Guthrie in Treatment: 5 Vital Signs Height(in): 69 Pulse(bpm): 112 Weight(lbs): 185 Blood Pressure(mmHg): 100/61 Body Mass Index(BMI): 27 Temperature(F): 98.3 Respiratory Rate(breaths/min): 18 Photos: [N/[name]:N/[name]] Wound Location: Left, Midline, Plantar Foot N/[name] N/[name] Wounding Event: Gradually Appeared N/[name] N/[name] Primary Etiology: Pressure Ulcer N/[name] N/[name] Comorbid History: Chronic sinus problems/congestion, N/[name] N/[name] Middle ear problems, Anemia, Chronic Obstructive Pulmonary Disease (COPD), Congestive Heart Failure, Type II Diabetes, End Stage Renal Disease, History of pressure wounds, Neuropathy Date Acquired: 08/07/2020 N/[name] N/[name] Weeks of Treatment: 5 N/[name] N/[name] Wound Status: Open N/[name] N/[name] Measurements L x W x D (cm) 1.7x1.8x1 N/[name] N/[name] Area (cm) : 2.403 N/[name] N/[name] Volume (cm) : 2.403 N/[name] N/[name] % Reduction in Area: 10.00% N/[name] N/[name] % Reduction in Volume: -80.00% N/[name] N/[name] Position 1 (o'clock): 10 Maximum Distance 1 (cm): 2 Starting Position 1 (o'clock): 12 Ending Position 1 (o'clock): 12 Maximum Distance 1 (cm): 0.6 Tunneling: Yes N/[name] N/[name] Undermining: Yes N/[name] N/[name] Classification: Category/Stage II N/[name] N/[name] Exudate Amount: Medium N/[name] N/[name] Exudate Type: Serosanguineous N/[name] N/[name] Exudate Color: red, brown N/[name] N/[name] Wound Margin: Epibole N/[name] N/[name] Granulation Amount: Medium (34-66%) N/[name] N/[name] Granulation Quality: Pink N/[name] N/[name] Necrotic Amount: Medium (34-66%) N/[name] N/[name] Exposed Structures: Fat Layer (Subcutaneous Tissue): N/[name] N/[name] Yes Fascia: No Tendon:  No Muscle: No Joint: No Bone: No Epithelialization: None N/[name] N/[name] Debridement: Debridement - Excisional N/[name] N/[name] 13:32 N/[name] N/[name] STORMIE, VENTOLA ([id number]) Pre-procedure Verification/Time Out Taken: Pain Control: Lidocaine 2% Topical Gel N/[name] N/[name] Tissue Debrided: Necrotic/Eschar, Callus, N/[name] N/[name] Subcutaneous, Slough Level: Skin/Subcutaneous Tissue N/[name] N/[name] Debridement Area (sq cm): 3.06 N/[name] N/[name] Instrument: Curette N/[name] N/[name] Bleeding: Minimum N/[name] N/[name] Hemostasis Achieved: Pressure N/[name] N/[name] Debridement Treatment Procedure was tolerated well N/[name] N/[name] Response: Post Debridement 1.7x1.8x1 N/[name] N/[name] Measurements L x W x D (cm) Post Debridement Volume: 2.403 N/[name] N/[name] (cm) Post Debridement Stage: Category/Stage II N/[name] N/[name] Procedures Performed: Debridement N/[name] N/[name] Treatment Notes Electronic Signature(s) Signed: 04/28/2021 2:19:57 PM By: Kalman Shan DO Entered By: Kalman Shan on 04/28/2021 13:42:11 Wheeless, Edgerton (  [id number]) -------------------------------------------------------------------------------- Multi-Disciplinary Care Plan Details Patient Name: PORSCHIA, WILLBANKS [name]. Date of Service: 04/28/2021 1:00 PM Medical Record Number: [id number] Patient Account Number: [id number] Date of Birth/Sex: 1976-08-28 (45 y.o. F) Treating RN: Donnamarie Poag Primary Care : Raelene Bott Other Clinician: Referring : Raelene Bott Treating /Extender: Yaakov Guthrie in Treatment: 5 Active Inactive Wound/Skin Impairment Nursing Diagnoses: Knowledge deficit related to ulceration/compromised skin integrity Goals: Patient/caregiver will verbalize understanding of skin care regimen Date Initiated: 03/19/2021 Date Inactivated: 04/28/2021 Target Resolution Date: 04/19/2021 Goal Status: Met Ulcer/skin breakdown will have [name] volume reduction of 30% by week 4 Date Initiated: 03/19/2021 Date Inactivated: 04/28/2021 Target Resolution Date: 04/19/2021 Goal Status: Unmet Unmet  Reason: con't tx Ulcer/skin breakdown will have [name] volume reduction of 50% by week 8 Date Initiated: 03/19/2021 Target Resolution Date: 05/19/2021 Goal Status: Active Ulcer/skin breakdown will have [name] volume reduction of 80% by week 12 Date Initiated: 03/19/2021 Target Resolution Date: 06/19/2021 Goal Status: Active Ulcer/skin breakdown will heal within 14 weeks Date Initiated: 03/19/2021 Target Resolution Date: 07/20/2021 Goal Status: Active Interventions: Assess patient/caregiver ability to obtain necessary supplies Assess patient/caregiver ability to perform ulcer/skin care regimen upon admission and as needed Assess ulceration(s) every visit Notes: Electronic Signature(s) Signed: 04/28/2021 4:25:48 PM By: Donnamarie Poag Entered By: Donnamarie Poag on 04/28/2021 13:20:48 Nuckles, [name] [name]. ([id number]) -------------------------------------------------------------------------------- Pain Assessment Details Patient Name: Heather Dear [name]. Date of Service: 04/28/2021 1:00 PM Medical Record Number: [id number] Patient Account Number: [id number] Date of Birth/Sex: 1975-11-13 (45 y.o. F) Treating RN: Donnamarie Poag Primary Care : Raelene Bott Other Clinician: Referring : Raelene Bott Treating /Extender: Yaakov Guthrie in Treatment: 5 Active Problems Location of Pain Severity and Description of Pain Patient Has Paino Yes Site Locations Pain Location: Pain in Ulcers Rate the pain. Current Pain Level: 8 Pain Management and Medication Current Pain Management: Electronic Signature(s) Signed: 04/28/2021 4:25:48 PM By: Donnamarie Poag Entered By: Donnamarie Poag on 04/28/2021 13:13:51 [name] ([id number]) -------------------------------------------------------------------------------- Patient/Caregiver Education Details Patient Name: Heather Dear [name]. Date of Service: 04/28/2021 1:00 PM Medical Record Number: [id number] Patient Account Number: [id number] Date of  Birth/Gender: [birth year] (45 y.o. F) Treating RN: Donnamarie Poag Primary Care Physician: Raelene Bott Other Clinician: Referring Physician: Raelene Bott Treating Physician/Extender: Yaakov Guthrie in Treatment: 5 Education Assessment Education Provided To: Patient and Caregiver mother Education Topics Provided Basic Hygiene: Methods: Explain/Verbal Responses: State content correctly Infection: Methods: Explain/Verbal Responses: State content correctly Nutrition: Methods: Explain/Verbal Responses: State content correctly Wound/Skin Impairment: Methods: Explain/Verbal Responses: State content correctly Electronic Signature(s) Signed: 04/28/2021 4:25:48 PM By: Donnamarie Poag Entered By: Donnamarie Poag on 04/28/2021 13:44:17 Lemelin, Sarin [name]. ([id number]) -------------------------------------------------------------------------------- Wound Assessment Details Patient Name: Heather Dear [name]. Date of Service: 04/28/2021 1:00 PM Medical Record Number: [id number] Patient Account Number: [id number] Date of Birth/Sex: 1976/05/28 (45 y.o. F) Treating RN: Donnamarie Poag Primary Care : Raelene Bott Other Clinician: Referring : Raelene Bott Treating /Extender: Yaakov Guthrie in Treatment: 5 Wound Status Wound Number: 7 Primary Pressure Ulcer Etiology: Wound Location: Left, Midline, Plantar Foot Wound Open Wounding Event: Gradually Appeared Status: Date Acquired: 08/07/2020 Comorbid Chronic sinus problems/congestion, Middle ear problems, Weeks Of Treatment: 5 History: Anemia, Chronic Obstructive Pulmonary Disease (COPD), Clustered Wound: No Congestive Heart Failure, Type II Diabetes, End Stage Renal Disease, History of pressure wounds, Neuropathy Photos Wound Measurements Length: (cm) 1.7 % R Width: (cm) 1.8 % R Depth: (cm) 1 Epi Area: (cm) 2.403 Tu Volume: (cm) 2.403 eduction in Area: 10% eduction in Volume: -80% thelialization:  None  nneling: Yes Position (o'clock): 10 Maximum Distance: (cm) 2 Undermining: Yes Starting Position (o'clock): 12 Ending Position (o'clock): 12 Maximum Distance: (cm) 0.6 Wound Description Classification: Category/Stage II Fo Wound Margin: Epibole Sl Exudate Amount: Medium Exudate Type: Serosanguineous Exudate Color: red, brown ul Odor After Cleansing: No ough/Fibrino Yes Wound Bed Granulation Amount: Medium (34-66%) Exposed Structure Granulation Quality: Pink Fascia Exposed: No Necrotic Amount: Medium (34-66%) Fat Layer (Subcutaneous Tissue) Exposed: Yes Necrotic Quality: Adherent Slough Tendon Exposed: No Muscle Exposed: No Joint Exposed: No Bone Exposed: No Tonche, Daurice [name]. ([id number]) Treatment Notes Wound #7 (Foot) Wound Laterality: Plantar, Left, Midline Cleanser Soap and Water Discharge Instruction: Gently cleanse wound with antibacterial soap, rinse and pat dry prior to dressing wounds Peri-Wound Care Desitin Maximum Strength Ointment 4 (oz) Discharge Instruction: apply around the outside of wound to protect skin Topical Primary Dressing AquacelAg Advantage Dressing, 2X2 (in/in) Discharge Instruction: Apply to wound and pack into the sides that tunnel under the edge of the wound Secondary Dressing ABD Pad 5x9 (in/in) Discharge Instruction: Cover with ABD pad Secured With 32M Medipore H Soft Cloth Surgical Tape, 2x2 (in/yd) Discharge Instruction: tape to secure the kerlix/rolled gauze Kerlix Roll Sterile or Non-Sterile 6-ply 4.5x4 (yd/yd) Discharge Instruction: Apply Kerlix as directed Compression Wrap Compression Stockings Add-Ons Electronic Signature(s) Signed: 04/28/2021 4:25:48 PM By: Donnamarie Poag Entered By: Donnamarie Poag on 04/28/2021 13:35:14 Mcgath, Keiara AMarland Kitchen ([id number]) -------------------------------------------------------------------------------- Adelino Details Patient Name: Heather Dear [name]. Date of Service: 04/28/2021 1:00 PM Medical Record  Number: [id number] Patient Account Number: [id number] Date of Birth/Sex: Nov 13, 1975 (45 y.o. F) Treating RN: Donnamarie Poag Primary Care : Raelene Bott Other Clinician: Referring : Raelene Bott Treating /Extender: Yaakov Guthrie in Treatment: 5 Vital Signs Time Taken: 13:11 Temperature (F): 98.3 Height (in): 69 Pulse (bpm): 112 Weight (lbs): 185 Respiratory Rate (breaths/min): 18 Body Mass Index (BMI): 27.3 Blood Pressure (mmHg): 100/61 Reference Range: 80 - 120 mg / dl Electronic Signature(s) Signed: 04/28/2021 4:25:48 PM By: Donnamarie Poag Entered ByDonnamarie Poag on 04/28/2021 13:13:18

## 2021-04-28 NOTE — Progress Notes (Signed)
NERIAH, ARGETSINGER ([id number]) Visit Report for 04/28/2021 Chief Complaint Document Details Patient Name: [name], [name] Date of Service: 04/28/2021 1:00 PM Medical Record Number: [id number] Patient Account Number: [id number] Date of Birth/Sex: [birth year] (45 y.o. F) Treating RN: Dolan Amen Primary Care Provider: Raelene Bott Other Clinician: Referring Provider: Raelene Bott Treating Provider/Extender: Yaakov Guthrie in Treatment: 5 Information Obtained from: Patient Chief Complaint Left great toe ulcer 03/19/2021; patient referred by Dr. Luana Shu who has been looking after her left foot for quite a period of time for review of a nonhealing area in the left midfoot Electronic Signature(s) Signed: 04/28/2021 2:19:57 PM By: Kalman Shan DO Entered By: Kalman Shan on 04/28/2021 13:42:30 [name], [name] ([id number]) -------------------------------------------------------------------------------- Debridement Details Patient Name: Heather Dear A. Date of Service: 04/28/2021 1:00 PM Medical Record Number: [id number] Patient Account Number: [id number] Date of Birth/Sex: [birth year] (45 y.o. F) Treating RN: Donnamarie Poag Primary Care Provider: Raelene Bott Other Clinician: Referring Provider: Raelene Bott Treating Provider/Extender: Yaakov Guthrie in Treatment: 5 Debridement Performed for Wound #7 Left,Midline,Plantar Foot Assessment: Performed By: Physician Kalman Shan, MD Debridement Type: Debridement Level of Consciousness (Pre- Awake and Alert procedure): Pre-procedure Verification/Time Out Yes - 13:32 Taken: Start Time: 13:32 Pain Control: Lidocaine 2% Topical Gel Total Area Debrided (L x W): 1.7 (cm) x 1.8 (cm) = 3.06 (cm) Tissue and other material Viable, Non-Viable, Callus, Eschar, Slough, Subcutaneous, Slough debrided: Level: Skin/Subcutaneous Tissue Debridement Description: Excisional Instrument: Curette Bleeding: Minimum Hemostasis  Achieved: Pressure End Time: 13:36 Response to Treatment: Procedure was tolerated well Level of Consciousness (Post- Awake and Alert procedure): Post Debridement Measurements of Total Wound Length: (cm) 1.7 Stage: Category/Stage II Width: (cm) 1.8 Depth: (cm) 1 Volume: (cm) 2.403 Character of Wound/Ulcer Post Debridement: Improved Post Procedure Diagnosis Same as Pre-procedure Electronic Signature(s) Signed: 04/28/2021 2:19:57 PM By: Kalman Shan DO Signed: 04/28/2021 4:25:48 PM By: Donnamarie Poag Entered By: Donnamarie Poag on 04/28/2021 13:39:50 [name], [name] ([id number]) -------------------------------------------------------------------------------- HPI Details Patient Name: Heather Dear A. Date of Service: 04/28/2021 1:00 PM Medical Record Number: [id number] Patient Account Number: [id number] Date of Birth/Sex: Mar 09, 1976 (45 y.o. F) Treating RN: Dolan Amen Primary Care Provider: Raelene Bott Other Clinician: Referring Provider: Raelene Bott Treating Provider/Extender: Yaakov Guthrie in Treatment: 5 History of Present Illness HPI Description: 01/18/18-She is here for initial evaluation of the left great toe ulcer. She is a poor historian in regards to timeframe in detail. She states approximately 4 weeks ago she lacerated her toe on something in the house. She followed up with her primary care who placed her on Bactrim and ultimately a second dose of Bactrim prior to coming to wound clinic. She states she has been treating the toe with peroxide, Betadine and a Band-Aid. She did not check her blood sugar this morning but checked it yesterday morning it was 327; she is unaware of a recent A1c and there are no current records. She saw Dr. she would've orthopedics last week for an old injury to the left ankle, she states he did not see her toe, nor did she bring it to his attention. She smokes approximately 1 pack cigarettes a day. Her social situation is concerning,  she arrives this morning with her mother who appears extremely intoxicated/under the influence; her mother was asked to leave the room and be monitored by the patient's grandmother. The patient's aunt then accompanied the patient and the room throughout the rest of the appointment. We had a lengthy discussion regarding the deleterious effects of uncontrolled  hyperglycemia and smoking as it relates to wound healing and overall health. She was strongly encouraged to decrease her smoking and get her diabetes under better control. She states she is currently on a diet and has cut down her [facility] consumption. The left toe is erythematous, macerated and slightly edematous with malodor present. The edema in her left foot is below her baseline, there is no erythema streaking. We will treat her with Santyl, doxycycline; we have ordered and xray, culture and provided a Peg assist surgical shoe and cultured the wound. 01/25/18-She is here in follow-up evaluation for a left great toe ulcer and presents with an abscess to her suprapubic area. She states her blood sugars remain elevated, feeling "sick" and if levels are below 250, but she is trying. She has made no attempt to decrease her smoking stating that we "can't take away her food in her cigarettes". She has been compliant with offloading using the PEG assist you. She is using Santyl daily. the culture obtained last week grew staph aureus and Enterococcus faecalis; continues on the doxycycline and Augmentin was added on Monday. The suprapubic area has erythema, no femoral variation, purple discoloration, minimal induration, was accessed with a cotton tip applicator with sanguinopurulent drainage, this was cultured, I suspect the current antibiotic treatment will cover and we will not add anything to her current treatment plan. She was advised to go to urgent care or ER with any change in redness, induration or fever. 02/01/18-She is here in follow-up  evaluation for left great toe ulcers and a new abdominal abscess from last week. She was able to use packing until earlier this week, where she "forgot it was there". She states she was feeling ill with GI symptoms last week and was not taking her antibiotic. She states her glucose levels have been predominantly less than 200, with occasional levels between 200-250. She thinks this was contributing to her GI symptoms as they have resolved without intervention. There continues to be significant laceration to left toe, otherwise it clinically looks stable/improved. There is now less superficial opening to the lateral aspect of the great toe that was residual blister. We will transition to [facility] to all wounds, she will continue her Augmentin. If there is no change or deterioration next week for reculture. 02/08/18-She is here in follow-up evaluation for left great toe ulcer and abdominal ulcer. There is an improvement in both wounds. She has been wrapping her left toe with coban, not by our direction, which has created an area of discoloration to the medial aspect; she has been advised to NOT use coban secondary to her neuropathy. She states her glucose levels have been high over this last week ranging from 200-350, she continues to smoke. She admits to being less compliant with her offloading shoe. We will continue with same treatment plan and she will follow-up next week. 02/15/18-She is here in follow-up evaluation for left great toe ulcer and abdominal ulcer. The abdominal ulcer is epithelialized. The left great toe ulcer is improved and all injury from last week using the Coban wrap is resolved, the lateral ulcer is healed. She admits to noncompliance with wearing offloading shoe and admits to glucose levels being greater than 300 most of the week. She continues to smoke and expresses no desire to quit. There is one area medially that probes deeper than it has historically, erythema to the toe  and dorsal foot has consistently waxed and waned. There is no overt signs of cellulitis or  infection but we will culture the wound for any occult infection given the new area of depth and erythema. We will hold off on sensitivities for initiation of antibiotic therapy. 02/22/18-She is here in follow up evaluation for left great toe ulcer. There is overall significant improvement in both wound appearance, erythema and edema with changes made last week. She was not initiated on antibiotic therapy. Culture obtained last week showed oxacillin sensitive staph aureus, sensitive to clindamycin. Clindamycin has been called into the pharmacy but she has been instructed to hold off on initiation secondary to overall clinical improvement and her history of antibiotic intolerance. She has been instructed to contact the clinic with any noted changes/deterioration and the wound, erythema, edema and/or pain. She will follow-up next week. She continues to smoke and her glucose levels remain elevated >250; she admits to compliance with offloading shoe 03/01/18 on evaluation today patient appears to be doing fairly well in regard to her left first toe ulcer. She has been tolerating the dressing changes with the [facility] without complication and overall this has definitely showed signs of improvement according to records as well is what the patient tells me today. I'm very pleased in that regard. She is having no pain today 03/08/18 She is here for follow up evaluation of a left great toe ulcer. She remains non-compliant with glucose control and smoking cessation; glucose levels consistently >200. She states that she got new shoe inserts/peg assist. She admits to compliance with offloading. Since my last evaluation there is significant improvement. We will switch to prisma at this time and she will follow up next week. She is noted to be tachycardic at this appointment, heart rate 120s; she has a history of  heart rate 70-130 according to our records. She admits to extreme agitation r/t personal issues; she was advised to monitor her heartrate and contact her physician if it does not return to a more normal range (<100). She takes cardizem twice daily. 03/15/18-She is here in follow-up evaluation for left great toe ulcer. She remains noncompliant with glucose control and smoking cessation. She admits to compliance with wearing offloading shoe. The ulcer is improved/stable and we will continue with the same treatment plan and she will follow-up next week 03/22/18-She is here for evaluation for left great toe ulcer. There continues to be significant improvement despite recurrent hyperglycemia (over 500 yesterday) and she continues to smoke. She has been compliant with offloading and we will continue with same treatment plan and she will follow-up next week. 03/29/18-She is here for evaluation for left great toe ulcer. Despite continuing to smoke and uncontrolled diabetes she continues to improve. She is compliant with offloading shoe. We will continue with the same treatment plan and she will follow-up next week 04/05/18- She is here in follow up evaluation for a left great toe ulcer; she presents with small pustule to left fifth toe (resembles ant bite). She admits to compliance with wearing offloading shoe; continues to smoke or have uncontrolled blood glucose control. There is more callus than usual with evidence of bleeding; she denies known trauma. 04/12/18-She is here for evaluation of left great toe ulcer. Despite noncompliance with glycemic control and smoking she continues to make Molony, Cyncere A. ([id number]) improvement. She continues to wear offloading shoe. The pustule, that was identified last week, to the left fifth toe is resolved. She will follow-up in 2 weeks 05/03/18-she is seen in follow-up evaluation for a left great toe ulcer. She is compliant with offloading,  otherwise noncompliant with  glycemic control and smoking. She has plateaued and there is minimal improvement noted. We will transition to [facility], replaced the insert to her surgical shoe and she will follow-up in one week 05/10/18- She is here in follow up evaluation for a left great toe ulcer. It appears stable despite measurement change. We will continue with same treatment plan and follow up next week. 05/24/18-She is seen in follow-up evaluation for a left great toe ulcer. She remains compliant with offloading, has made significant improvement in her diet, decreasing the amount of sugar/soda. She said her recent A1c was 10.9 which is lower than. She did see a diabetic nutritionist/educator yesterday. She continues to smoke. We will continue with the same treatment plan and she'll follow-up next week. 05/31/18- She is seen in follow-up evaluation for left great toe ulcer. She continues to remain compliant with offloading, continues to make improvement in her diet, increasing her water and decreasing the amount of sugar/soda. She does continue to smoke with no desire to quit. We will apply Prisma to the depth and Hydrofera Blue over. We have not received insurance authorization for oasis. She will follow up next week. 06/07/18-She is seen in follow-up evaluation for left great toe ulcer. It has stalled according to today's measurements although base appears stable. She says she saw a diabetic educator yesterday; her average blood sugars are less than 300 which is an improvement for her. She continues to smoke and states "that's my next step" She continues with water over soda. We will order for xray, culture and reinstate ace wrap compression prior to placing apligraf for next week. She is voicing no complaints or concerns. Her dressing will change to iodoflex over the next week in preparation for apligraf. 06/14/18-She is seen in follow-up evaluation for left great toe ulcer. Plain film x-ray performed last week was  negative for osteomyelitis. Wound culture obtained last week grew strep B and OSSA; she is initiated on keflex and cefdinir today; there is erythema to the toe which could be from ace wrap compression, she has a history of wrapping too tight and has has been encouraged to maintain ace wraps that we place today. We will hold off on application of apligraf today, will apply next week after antibiotic therapy has been initiated. She admits today that she has resumed taking a shower with her foot/toe submerged in water, she has been reminded to keep foot/toe out of the bath water. She will be seen in follow up next week 06/21/18-she is seen in follow-up evaluation for left great toe ulcer. She is tolerating antibiotic therapy with no GI disturbance. The wound is stable. Apligraf was applied today. She has been decreasing her smoking, only had 4 cigarettes yesterday and 1 today. She continues being more compliant in diabetic diet. She will follow-up next week for evaluation of site, if stable will remove at 2 weeks. 06/28/18- She is here in follow up evalution. Apligraf was placed last week, she states the dressing fell off on Tuesday and she was dressing with hydrofera blue. She is healed and will be discharged from the clinic today. She has been instructed to continue with smoking cessation, continue monitoring glucose levels, offloading for an additional 4 weeks and continue with hydrofera blue for additional two weeks for any possible microscopic opening. Readmission: 08/07/18 on evaluation today patient presents for reevaluation concerning the ulcer of her right great toe. She was previously discharged on 06/28/18 healed. Nonetheless she states that this began  to show signs of drainage she subsequently went to her primary care provider. Subsequently an x-ray was performed on 08/01/18 which was negative. The patient was also placed on antibiotics at that time. Fortunately they should have been effective  for the infection. Nonetheless she's been experiencing some improvement but still has a lot of drainage coming from the wound itself. 08/14/18 on evaluation today patient's wound actually does show signs of improvement in regard to the erythema at this point. She has completed the antibiotics. With that being said we did discuss the possibility of placing her in a total contact cast as of today although I think that I may want to give this just a little bit more time to ensure nothing recurrence as far as her infection is concerned. I do not want to put in the cast and risk infection at that time if things are not completely resolved. With that being said she is gonna require some debridement today. 08/21/18 on evaluation today patient actually appears to be doing okay in regard to her toe ulcer. She's been tolerating the dressing changes without complication. With that being said it does appear that she is ready and in fact I think it's appropriate for [place] to go ahead and initiate the total contact cast today. Nonetheless she will require some sharp debridement to prepare the wound for application. Overall I feel like things have been progressing well but we do need to do something to get this to close more readily. 08/24/18 patient seen today for reevaluation after having had the total contact cast applied on Tuesday. She seems to have done very well the wound appears to be doing great and overall I'm pleased with the progress that she's made. There were no abnormal areas of rubbing from the cast on her lower extremity. 08/30/18 on evaluation today patient actually appears to be completely healed in regard to her plantar toe ulcer. She tells me at this point she's been having a lot of issues with the cast. She almost fell a couple of times the state shall the step of her dog a couple times as well. This is been a very frustrating process for her other nonetheless she has completely healed the wound  which is excellent news. Overall there does not appear to be the evidence of infection at this time which is great news. 09/11/18 evaluation today patient presents for follow-up concerning her great toe ulcer on the left which has unfortunately reopened since I last saw her which was only a couple of weeks ago. Unfortunately she was not able to get in to get the shoe and potentially the AFO that's gonna be necessary due to her left foot drop. She continues with offloading shoe but this is not enough to prevent her from reopening it appears. When we last had her in the total contact cast she did well from a healing standpoint but unfortunately the wound reopened as soon as she came out of the cast within just a couple of weeks. 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 This is also part of the reason why she falls on a regular basis. I just do not believe that is gonna be safe for [place] to reinitiate the total contact cast as last time we had this  on she fell 3 times one week which is definitely not normal for her. 09/18/18 upon evaluation today the patient actually appears to be doing about the same in regard to her toe ulcer. She did not contact [employer] as I asked her to even though I had given her the prescription. In fact she actually states that she has no idea where the prescription is. She did apparently call [facility] and they told her that all she needed to do was bring the prescription in order to be able to be seen and work on getting the AFO for her left foot. With all that being said she still does not have an appointment and I'm not sure were things stand that regard. I will give her a new prescription today in order to contact them to get this set up. 09/25/18 on evaluation today patient actually  appears to be doing about the same in regard to her toes ulcer. She does have a small areas which seems to have a lot of callous buildup around the edge of the wound which is going to need sharp debridement today. She still is waiting to be scheduled for evaluation with [facility] for possibility of an AFO. She states there supposed to call her tomorrow to get this set up. Unfortunately it does appear that her foot specifically the toe area is showing signs of erythema. There does not appear to be any systemic infection which is in these good news. [name], [name] ([id number]) 10/02/18 on evaluation today patient actually appears to be doing about the same in regard to her toe ulcer. This really has not done too well although it's not significantly larger it's also not significantly smaller. She has been tolerating the dressing changes without complication. She actually has her appointment with [facility] and [place] tomorrow to hopefully be measured for obtaining and AFO splint. I think this would be helpful preventing this from reoccurring. We had contemplated starting the cast this week although to be honest I am reluctant to do that as she's been having nausea, vomiting, and seizure activity over the past three days. She has a history of seizures and have been told is nothing that can be done for these. With that being said I do believe that along with the seizures have the nausea vomiting which upon further questioning doesn't seem to be the normal for her and makes me concerned for the possibility of infection or something else going on. I discussed this with the patient and her mother during the office visit today. I do not feel the wound is effective but maybe something else. The responses this was "this just happens to her at times and we don't know why". They did not seem to be interested in going to the hospital to have this checked out further. 10/09/18 on evaluation today patient presents  for follow-up concerning her ongoing toe ulcer. She has been tolerating the dressing changes without complication. Fortunately there does not appear to be any evidence of infection which is great news however I do think that the patient would benefit from going ahead for with the total contact cast. She's actually in a wheelchair today she tells me that she will use her walker if we initiate the cast. I was very specific about the fact that if we were gonna do the cast I wanted to make sure that she was using the walker in order to prevent any falls. She tells me she does not have stairs that she has  to traverse on a regular basis at her home. She has not had any seizures since last week again that something that happens to her often she tells me she did talk to [employer] and they said that it may take up to three weeks to get the brace approved for her. Hopefully that will not take that long but nonetheless in the meantime I do think the cast could be of benefit. 10/12/18 on evaluation today patient appears to be doing rather well in regard to her toe ulcer. It's just been a few days and already this is significantly improved both as far as overall appearance and size. Fortunately there's no sign of infection. She is here for her first obligatory cast change. 10/19/18 Seen today for follow up and management of left great toe ulcer. Wound continues to show improvement. Noted small open area with seroussang drainage with palpation. Denies any increased pain or recent fevers during visit. She will continue calcium alginate with offloading shoe. Denies any questions or concerns during visit. 10/26/18 on evaluation today patient appears to be doing about the same as when I last saw her in regard to her wound bed. Fortunately there does not appear to be any signs of infection. Unfortunately she continues to have a breakdown in regard to the toe region any time that she is not in the cast. It takes almost no  time at all for this to happen. Nonetheless she still has not heard anything from the brace being made by [employer] as to when exactly this will be available to her. Fortunately there is no signs of infection at this time. 10/30/18 on evaluation today patient presents for application of the total contact cast as we just received him this morning. Fortunately we are gonna be able to apply this to her today which is great news. She continues to have no significant pain which is good news. Overall I do feel like things have been improving while she was the cast is when she doesn't have a cast that things get worse. She still has not really heard anything from [facility] regarding her brace. 11/02/18 upon evaluation today patient's wound already appears to be doing significantly better which is good news. Fortunately there does not appear to be any signs of infection also good news. Overall I do think the total contact cast as before is helping to heal this area unfortunately it's just not gonna likely keep the area closed and healed without her getting her brace at least. Again the foot drop is a significant issue for her. 11/09/18 on evaluation today patient appears to be doing excellent in regard to her toe ulcer which in fact is completely healed. Fortunately we finally got the situation squared away with the paperwork which was needed to proceed with getting her brace approved by Medicaid. I have filled that out unfortunately that information has been sent to the orthopedic office that I worked at 2 1/2 years ago and not tired Current wound care measures. Fortunately she seems to be doing very well at this time. 11/23/18 on evaluation today patient appears to be doing More Poorly Compared to Last Time I Saw Her. At [facility] She Had Completely Healed. Currently she is continuing to have issues with reopening. She states that she just found out that the brace was approved through Medicaid now she just has to  go get measured in order to have this fitted for her and then made. Subsequently she does not have an appointment  for this yet that is going to complicate things we obviously cannot put her back in the cast if we do not have everything measured because they're not gonna be able to measure her foot while she is in the cast. 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 Obviously this is unfortunate and does have me somewhat worried as well. 11/30/18 on evaluation today patient's toe ulcer actually appears to be doing fairly well. The good news is she will be getting her brace in the shoes next week on Wednesday. Hopefully we will be able to get this to heal without having to go back in the cast however she may need the cast in order to get the wound completely heal and then go from there. Fortunately there's no signs of infection at this time. 12/07/18 on evaluation today patient fortunately did receive her brace and she states she could tell this definitely makes her walk better. With that being said she's been having issues with her toe where she noticed yesterday there was a lot of tissue that was loosing off this appears to be much larger than what it was previous. She also states that her leg has been read putting much across the top of her foot just about the ankle although this seems to be receiving somewhat. The total area is still red and appears to be someone infected as best I can tell. She is previously taken Bactrim and that may be a good option for her today as well. We are gonna see what I wound culture shows as well and I think that this is definitely appropriate. With that being said outside of the culture I still need to initiate something in the interim and that's what I'm gonna go ahead and select Bactrim is a good option for her. 12/14/18 on evaluation today patient appears to be doing  better in regard to her left great toe ulcer as compared to last week's evaluation. There's still some erythema although this is significantly improved which is excellent news. Overall I do believe that she is making good progress is still gonna take some time before she is where I would like her to be from the standpoint of being able to place her back into the total contact cast. Hopefully we will be where we need to be by next week. 12/21/18 on evaluation today patient actually appears to be doing poorly in regard to her toe ulcer. She's been tolerating the dressing changes without complication. Fortunately there's no signs of systemic infection although she does have a lot of drainage from the toe ulcer and this does seem to be causing some issues at this point. She does have erythema on the distal portion of her toe that appears to be likely cellulitis. 12/28/18 on evaluation today patient actually appears to be doing a little better in my pinion in regard to her toe ulcer. With that being said she still does have some evidence of infection at this time and for her culture she had both E. coli as well as enterococcus as organisms noted on evaluation. For that reason I think that though the Keflex likely has treated the E. coli rather well this has really done nothing for the enterococcus. We are going to have to initiate treatment for this specifically. [name], [name] ([id number]) 01/04/19 on evaluation today patient's toe actually appears to be doing better  from the standpoint of infection. She currently would like to see about putting the cash back on I think that this is appropriate as long as she takes care of it and keeps it from getting wet. She is gonna have some drainage we can definitely pass this up with Drawtex and alginate to try to prevent as much drainage as possible from causing the problems. With that being said I do want to at least try her with the cast between now and Tuesday. If  there any issues we can't continue to use it then I will discontinue the use of the cast at that point. 01/08/19 on evaluation today patient actually appears to be doing very well as far as her foot ulcer specifically the great toe on the left is concerned. She did have an area of rubbing on the medial aspect of her left ankle which again is from the cast. Fortunately there's no signs of infection at this point in this appears to be a very slight skin breakdown. The patient tells me she felt it rubbing but didn't think it was that bad. Fortunately there is no signs of active infection at this time which is good news. No fevers, chills, nausea, or vomiting noted at this time. 01/15/19 on evaluation today patient actually appears to be doing well in regard to her toe ulcer. Again as previous she seems to do well and she has the cast on which indicates to me that during the time she doesn't have a cast on she's putting way too much pressure on this region. Obviously I think that's gonna be an issue as with the current national emergency concerning the Covid-19 Virus it has been recommended that we discontinue the use of total contact casting by the chief medical officer of our company, Dr. Simona Huh. The reasoning is that if a patient becomes sick and cannot come into have the cast removed they could not just leave this on for an additional two weeks. Obviously the hospitals also do not want to receive patient's who are sick into the emergency department to potentially contaminate the region and spread the Covid-19 Virus among other sick individuals within the hospital system. Therefore at this point we are suspending the use of total contact cast until the current emergency subsides. This was all discussed with the patient today as well. 01/22/19 on evaluation today patient's wound on her left great toe appears to be doing slightly worse than previously noted last week. She tells me that she has been on this  quite a bit in fact she tells me she's been awake for 38 straight hours. This is due to the fact that she's having to care for grandparents because nobody else will. She has been taking care of them for five the last seven days since I've seen her they both have dementia his is from a stroke and her grandmother's was progressive. Nonetheless she states even her mom who knows her condition and situation has only help two of those days to take care of them she's been taking care of the rest. Fortunately there does not appear to be any signs of active infection in regard to her toe at this point although obviously it doesn't look as good as it did previous. I think this is directly related to her not taking off the pressure and friction by way of taking things easy. Though I completely understand what's going on. 01/29/19 on evaluation today patient's tools are actually appears to be showing some  signs of improvement today compared to last week's evaluation as far as not necessarily the overall size of the wound but the fact that she has some new skin growth in between the two ends of the wound opening. Overall I feel like she has done well she states that she had a family member give her what sounds to be a CAM walker boot which has been helpful as well. 02/05/19 on evaluation today patient's wound bed actually appears to be doing significantly better in regard to her overall appearance of the size of the wound. With that being said she is still having an issue with offloading efficiently enough to get this to close. Apparently there is some signs of infection at this point as well unfortunately. Previously she's done well of Augmentin I really do not see anything that needs to be culture currently but there theme and cellulitis of the foot that I'm seeing I'm gonna go ahead and place her on an antibiotic today to try to help clear this up. 02/12/2019 on evaluation today patient actually appears to be doing  poorly in regard to her overall wound status. She tells me she has been using her offloading shoe but actually comes in today wearing her tennis shoe with the AFO brace. Again as I previously discussed with her this is really not sufficient to allow the area to heal appropriately. Nonetheless she continues to be somewhat noncompliant and I do wonder based on what she has told my nurse in the past as to whether or not a good portion of this noncompliance may be recreational drug and alcohol related. She has had a history of heroin overdose and this was fairly recently in the past couple of months that have been seeing her. Nonetheless overall I feel like her wound looks significantly worse today compared to what it was previous. She still has significant erythema despite the Augmentin I am not sure that this is an appropriate medication for her infection I am also concerned that the infection may have gone down into her bone. 02/19/19 on evaluation today patient actually appears to be doing about the same in regard to her toe ulcer. Unfortunately she continues to show signs of bone exposure and infection at this point. There does not appear to be any evidence of worsening of the infection but I'm also not really sure that it's getting significantly better. She is on the Augmentin which should be sufficient for the Staphylococcus aureus infection that she has at this point. With that being said she may need IV antibiotics to more appropriately treat this. We did have a discussion today about hyperbaric option therapy. 02/28/19 on evaluation today patient actually appears to be doing much worse in regard to the wound on her left great toe as compared to even my previous evaluation last week. Unfortunately this seems to be training in a pretty poor direction. Her toe was actually now starting to angle laterally and I can actually see the entire joint area of the proximal portion of the digit where is the  distal portion of the digit again is no longer even in contact with the joint line. Unfortunately there's a lot more necrotic tissue around the edge and the toe appears to be showing signs of becoming gangrenous in my pinion. I'm very concerned about were things stand at this point. She did see infectious disease and they are planning to send in a prescription for Sivextro for her and apparently this has been approved. With  that being said I don't think she should avoid taking this but at the same time I'm not sure that it's gonna be sufficient to save her toe at this point. 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 03/05/19 on evaluation today patient actually appears to be doing quite well in regard to her toe all things considering. She still has bone exposed but there appears to be much less your thing on overall the appearance of the wound and the toe itself is dramatically improved. She still does have some issues currently obviously with infection she did see vascular as well and there concerned that her blood flow to the toad. For that reason they are setting up for an angiogram next week. 03/14/19 on evaluation today patient appears to be doing very poor in regard to her toe and specifically in regard to the ulceration and the fact that she's starting to notice the toe was leaning even more towards the lateral aspect and the complete joint is visible on the proximal aspect of the joint. Nonetheless she's also noted a significant odor and the tip of the toe is turning more dark and necrotic appearing. Overall I think she is getting worse not better as far as this is concerned. For that reason I am recommending at this point that she likely needs to be seen for  likely amputation. READMISSION 03/19/2021 This is a patient that we cared for in this clinic for a prolonged period of time in 2019 and 2020 with a left foot and left first toe wound. 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 ([id number]) 04/09/20 by Dr. Luana Shu. In December 2021 she had an ulcer on her right great toe as well as the fourth and fifth toes. 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 She also had an angiogram at that time and underwent angioplasty of the right anterior tibial artery. In any case she claims that the wound on the right foot is closed I did not look at this today which was probably an oversight although I think that should be done next week. After her surgery she developed a dehiscence but I do not see any follow-up of this. According to Dr. Deborra Medina last review that she was out of the area being cared for by another physician but recently came back to his attention. 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 A culture of this area showed E. coli apparently before she came back to see Dr. Luana Shu she was supposed to be receiving antibiotics but she did not really take them. Nor is she offloading this area at all. Finally her last hemoglobin A1c listed in epic was in March 2022 at 14.1 she says things are a lot better since then although I am not sure. 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 She had this wound at the time. During this admission she had E. coli sepsis a TEE was negative. 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 She is still a heavy cigarette smoker. She has a history of bipolar disorder seizure disorder ABI in our clinic was 1.05 6/1; left midfoot in the setting of a TMA done previously. Round circular wound with a "knuckle" of protruding tissue. The problem is that the knuckle was not attached to any of the surrounding granulation and this probed proximally widely I removed a large portion of this tissue. This wound goes with considerable undermining laterally. I do not feel any bone there was no purulence but this is a deep wound. 6/8; in spite of the debridement I did last week. She arrives with a wound looking exactly the same. A protruding "knuckle" of tissue nonadherent to most of the surrounding tissue. There is considerable depth around this from 6-12 o'clock at 2.7 cm and undermining of 1 cm. This does not look overtly infected and the x-ray I did last week was negative for any osseous abnormalities. We have been using silver collagen 6/15; deep tissue culture I did last week showed moderate staph aureus and moderate Pseudomonas. This will definitely require prolonged antibiotic therapy. The pathology on the protuberant area was negative for malignancy fungus etc. 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 We have been using silver collagen. I am going to be prescribing Levaquin for 2 weeks. Her CT scan of the foot is down for 7/5 6/22; CT scan of the foot on 7 5. She says she has hardware in the left leg from her previous fracture. She is on the Levaquin for the deep tissue culture I did that showed methicillin sensitive staph aureus and Pseudomonas. I gave her a 2-week supply and she will have another week. She arrives in clinic today with the same protuberant tissue however this is nonadherent to the tissue surrounding it. I am really at a loss to explain this unless there is  underlying deep tissue infection 6/29; patient presents for 1 week follow-up. She has been using collagen to the wound bed. She reports taking her antibiotics as prescribed.She has no complaints or issues today. She denies signs of infection. 7/6; patient presents for one week followup. She has been using collagen to the wound bed. She states she is taking Levaquin however at times she is not able to keep it down. She denies signs of infection. Electronic Signature(s) Signed: 04/28/2021 2:19:57 PM By: Kalman Shan DO Entered By: Kalman Shan on 04/28/2021 14:15:12 [name] ([id number]) -------------------------------------------------------------------------------- Physical Exam Details Patient Name: Heather Dear A. Date of Service: 04/28/2021 1:00 PM Medical Record Number: [id number] Patient Account Number: [id number] Date of Birth/Sex: 04/06/1976 (45 y.o. F) Treating RN: Dolan Amen Primary Care Provider: Raelene Bott Other Clinician: Referring Provider: Raelene Bott Treating Provider/Extender: Yaakov Guthrie in Treatment: 5 Constitutional . Cardiovascular . Psychiatric . Notes Left foot: Wound to the plantar aspect that that has some granulation tissue and nonviable tissue present. Most of the tissue is pale tissue and overall does not look very healthy. There is significant callus circumferentially. No surrounding signs of infection Electronic Signature(s) Signed: 04/28/2021 2:19:57 PM By: Kalman Shan DO Entered By: Kalman Shan on 04/28/2021 14:17:35 [name] ([id number]) -------------------------------------------------------------------------------- Physician Orders Details Patient Name: Heather Dear A. Date of Service: 04/28/2021 1:00 PM Medical Record Number: [id number] Patient Account Number: [id number] Date of Birth/Sex: 07-Jun-1976 (45 y.o. F) Treating RN: Lyndel Safe,  Joy Primary Care Provider: Raelene Bott Other  Clinician: Referring Provider: Raelene Bott Treating Provider/Extender: Yaakov Guthrie in Treatment: 5 Verbal / Phone Orders: No Diagnosis Coding Follow-up Appointments o Return Appointment in 1 week. Bathing/ Shower/ Hygiene o Clean wound with Normal Saline or wound cleanser. Off-Loading Left Lower Extremity o Open toe surgical shoe with peg assist. Medications-Please add to medication list. o P.O. Antibiotics - Levaquin continue Wound Treatment Wound #7 - Foot Wound Laterality: Plantar, Left, Midline Cleanser: Soap and Water Every Other Day/30 Days Discharge Instructions: Gently cleanse wound with antibacterial soap, rinse and pat dry prior to dressing wounds Peri-Wound Care: Desitin Maximum Strength Ointment 4 (oz) Every Other Day/30 Days Discharge Instructions: apply around the outside of wound to protect skin Primary Dressing: AquacelAg Advantage Dressing, 2X2 (in/in) Every Other Day/30 Days Discharge Instructions: Apply to wound and pack into the sides that tunnel under the edge of the wound Secondary Dressing: ABD Pad 5x9 (in/in) Every Other Day/30 Days Discharge Instructions: Cover with ABD pad Secured With: 24M Medipore H Soft Cloth Surgical Tape, 2x2 (in/yd) Every Other Day/30 Days Discharge Instructions: tape to secure the kerlix/rolled gauze Secured With: Kerlix Roll Sterile or Non-Sterile 6-ply 4.5x4 (yd/yd) Every Other Day/30 Days Discharge Instructions: Apply Kerlix as directed Electronic Signature(s) Signed: 04/28/2021 2:19:57 PM By: Kalman Shan DO Signed: 04/28/2021 4:25:48 PM By: Donnamarie Poag Entered By: Donnamarie Poag on 04/28/2021 13:48:48 Wedge, Jessel AMarland Kitchen ([id number]) -------------------------------------------------------------------------------- Problem List Details Patient Name: Heather Dear A. Date of Service: 04/28/2021 1:00 PM Medical Record Number: [id number] Patient Account Number: [id number] Date of Birth/Sex: 11/08/1975 (45 y.o.  F) Treating RN: Dolan Amen Primary Care Provider: Raelene Bott Other Clinician: Referring Provider: Raelene Bott Treating Provider/Extender: Yaakov Guthrie in Treatment: 5 Active Problems ICD-10 Encounter Code Description Active Date MDM Diagnosis E11.621 Type 2 diabetes mellitus with foot ulcer 03/19/2021 No Yes 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 03/19/2021 No Yes severity E11.42 Type 2 diabetes mellitus with diabetic polyneuropathy 03/19/2021 No Yes Inactive Problems Resolved Problems Electronic Signature(s) Signed: 04/28/2021 2:19:57 PM By: Kalman Shan DO Entered By: Kalman Shan on 04/28/2021 13:42:02 [name], [name] ([id number]) -------------------------------------------------------------------------------- Progress Note Details Patient Name: Heather Dear A. Date of Service: 04/28/2021 1:00 PM Medical Record Number: [id number] Patient Account Number: [id number] Date of Birth/Sex: April 30, 1976 (45 y.o. F) Treating RN: Dolan Amen Primary Care Provider: Raelene Bott Other Clinician: Referring Provider: Raelene Bott Treating Provider/Extender: Yaakov Guthrie in Treatment: 5 Subjective Chief Complaint Information obtained from Patient Left great toe ulcer 03/19/2021; patient referred by Dr. Luana Shu who has been looking after her left foot for quite a period of time for review of a nonhealing area in the left midfoot History of Present Illness (HPI) 01/18/18-She is here for initial evaluation of the left great toe ulcer. She is a poor historian in regards to timeframe in detail. She states approximately 4 weeks ago she lacerated her toe on something in the house. She followed up with her primary care who placed her on Bactrim and ultimately a second dose of Bactrim prior to coming to wound clinic. She states she has been treating the toe with peroxide, Betadine and a Band-Aid. She did not check her  blood sugar this morning but checked it yesterday morning it was 327; she is unaware of a recent A1c and there are no current records. She saw Dr. she would've orthopedics last week for an old injury to the left ankle, she states he did  not see her toe, nor did she bring it to his attention. She smokes approximately 1 pack cigarettes a day. Her social situation is concerning, she arrives this morning with her mother who appears extremely intoxicated/under the influence; her mother was asked to leave the room and be monitored by the patient's grandmother. The patient's aunt then accompanied the patient and the room throughout the rest of the appointment. We had a lengthy discussion regarding the deleterious effects of uncontrolled hyperglycemia and smoking as it relates to wound healing and overall health. She was strongly encouraged to decrease her smoking and get her diabetes under better control. She states she is currently on a diet and has cut down her [facility] consumption. The left toe is erythematous, macerated and slightly edematous with malodor present. The edema in her left foot is below her baseline, there is no erythema streaking. We will treat her with Santyl, doxycycline; we have ordered and xray, culture and provided a Peg assist surgical shoe and cultured the wound. 01/25/18-She is here in follow-up evaluation for a left great toe ulcer and presents with an abscess to her suprapubic area. She states her blood sugars remain elevated, feeling "sick" and if levels are below 250, but she is trying. She has made no attempt to decrease her smoking stating that we "can't take away her food in her cigarettes". She has been compliant with offloading using the PEG assist you. She is using Santyl daily. the culture obtained last week grew staph aureus and Enterococcus faecalis; continues on the doxycycline and Augmentin was added on Monday. The suprapubic area has erythema, no femoral variation,  purple discoloration, minimal induration, was accessed with a cotton tip applicator with sanguinopurulent drainage, this was cultured, I suspect the current antibiotic treatment will cover and we will not add anything to her current treatment plan. She was advised to go to urgent care or ER with any change in redness, induration or fever. 02/01/18-She is here in follow-up evaluation for left great toe ulcers and a new abdominal abscess from last week. She was able to use packing until earlier this week, where she "forgot it was there". She states she was feeling ill with GI symptoms last week and was not taking her antibiotic. She states her glucose levels have been predominantly less than 200, with occasional levels between 200-250. She thinks this was contributing to her GI symptoms as they have resolved without intervention. There continues to be significant laceration to left toe, otherwise it clinically looks stable/improved. There is now less superficial opening to the lateral aspect of the great toe that was residual blister. We will transition to [facility] to all wounds, she will continue her Augmentin. If there is no change or deterioration next week for reculture. 02/08/18-She is here in follow-up evaluation for left great toe ulcer and abdominal ulcer. There is an improvement in both wounds. She has been wrapping her left toe with coban, not by our direction, which has created an area of discoloration to the medial aspect; she has been advised to NOT use coban secondary to her neuropathy. She states her glucose levels have been high over this last week ranging from 200-350, she continues to smoke. She admits to being less compliant with her offloading shoe. We will continue with same treatment plan and she will follow-up next week. 02/15/18-She is here in follow-up evaluation for left great toe ulcer and abdominal ulcer. The abdominal ulcer is epithelialized. The left great toe ulcer is  improved  and all injury from last week using the Coban wrap is resolved, the lateral ulcer is healed. She admits to noncompliance with wearing offloading shoe and admits to glucose levels being greater than 300 most of the week. She continues to smoke and expresses no desire to quit. There is one area medially that probes deeper than it has historically, erythema to the toe and dorsal foot has consistently waxed and waned. There is no overt signs of cellulitis or infection but we will culture the wound for any occult infection given the new area of depth and erythema. We will hold off on sensitivities for initiation of antibiotic therapy. 02/22/18-She is here in follow up evaluation for left great toe ulcer. There is overall significant improvement in both wound appearance, erythema and edema with changes made last week. She was not initiated on antibiotic therapy. Culture obtained last week showed oxacillin sensitive staph aureus, sensitive to clindamycin. Clindamycin has been called into the pharmacy but she has been instructed to hold off on initiation secondary to overall clinical improvement and her history of antibiotic intolerance. She has been instructed to contact the clinic with any noted changes/deterioration and the wound, erythema, edema and/or pain. She will follow-up next week. She continues to smoke and her glucose levels remain elevated >250; she admits to compliance with offloading shoe 03/01/18 on evaluation today patient appears to be doing fairly well in regard to her left first toe ulcer. She has been tolerating the dressing changes with the [facility]ressing without complication and overall this has definitely showed signs of improvement according to records as well is what the patient tells me today. I'm very pleased in that regard. She is having no pain today 03/08/18 She is here for follow up evaluation of a left great toe ulcer. She remains non-compliant with glucose control  and smoking cessation; glucose levels consistently >200. She states that she got new shoe inserts/peg assist. She admits to compliance with offloading. Since my last evaluation there is significant improvement. We will switch to prisma at this time and she will follow up next week. She is noted to be tachycardic at this appointment, heart rate 120s; she has a history of heart rate 70-130 according to our records. She admits to extreme agitation r/t personal issues; she was advised to monitor her heartrate and contact her physician if it does not return to a more normal range (<100). She takes cardizem twice daily. 03/15/18-She is here in follow-up evaluation for left great toe ulcer. She remains noncompliant with glucose control and smoking cessation. She admits to compliance with wearing offloading shoe. The ulcer is improved/stable and we will continue with the same treatment plan and she will follow-up next week 03/22/18-She is here for evaluation for left great toe ulcer. There continues to be significant improvement despite recurrent hyperglycemia (over 500 yesterday) and she continues to smoke. She has been compliant with offloading and we will continue with same treatment plan and she will follow-up next week. [name], [name] ([id number]) 03/29/18-She is here for evaluation for left great toe ulcer. Despite continuing to smoke and uncontrolled diabetes she continues to improve. She is compliant with offloading shoe. We will continue with the same treatment plan and she will follow-up next week 04/05/18- She is here in follow up evaluation for a left great toe ulcer; she presents with small pustule to left fifth toe (resembles ant bite). She admits to compliance with wearing offloading shoe; continues to smoke or have uncontrolled blood glucose  control. There is more callus than usual with evidence of bleeding; she denies known trauma. 04/12/18-She is here for evaluation of left great toe ulcer.  Despite noncompliance with glycemic control and smoking she continues to make improvement. She continues to wear offloading shoe. The pustule, that was identified last week, to the left fifth toe is resolved. She will follow-up in 2 weeks 05/03/18-she is seen in follow-up evaluation for a left great toe ulcer. She is compliant with offloading, otherwise noncompliant with glycemic control and smoking. She has plateaued and there is minimal improvement noted. We will transition to [facility], replaced the insert to her surgical shoe and she will follow-up in one week 05/10/18- She is here in follow up evaluation for a left great toe ulcer. It appears stable despite measurement change. We will continue with same treatment plan and follow up next week. 05/24/18-She is seen in follow-up evaluation for a left great toe ulcer. She remains compliant with offloading, has made significant improvement in her diet, decreasing the amount of sugar/soda. She said her recent A1c was 10.9 which is lower than. She did see a diabetic nutritionist/educator yesterday. She continues to smoke. We will continue with the same treatment plan and she'll follow-up next week. 05/31/18- She is seen in follow-up evaluation for left great toe ulcer. She continues to remain compliant with offloading, continues to make improvement in her diet, increasing her water and decreasing the amount of sugar/soda. She does continue to smoke with no desire to quit. We will apply Prisma to the depth and Hydrofera Blue over. We have not received insurance authorization for oasis. She will follow up next week. 06/07/18-She is seen in follow-up evaluation for left great toe ulcer. It has stalled according to today's measurements although base appears stable. She says she saw a diabetic educator yesterday; her average blood sugars are less than 300 which is an improvement for her. She continues to smoke and states "that's my next step" She continues  with water over soda. We will order for xray, culture and reinstate ace wrap compression prior to placing apligraf for next week. She is voicing no complaints or concerns. Her dressing will change to iodoflex over the next week in preparation for apligraf. 06/14/18-She is seen in follow-up evaluation for left great toe ulcer. Plain film x-ray performed last week was negative for osteomyelitis. Wound culture obtained last week grew strep B and OSSA; she is initiated on keflex and cefdinir today; there is erythema to the toe which could be from ace wrap compression, she has a history of wrapping too tight and has has been encouraged to maintain ace wraps that we place today. We will hold off on application of apligraf today, will apply next week after antibiotic therapy has been initiated. She admits today that she has resumed taking a shower with her foot/toe submerged in water, she has been reminded to keep foot/toe out of the bath water. She will be seen in follow up next week 06/21/18-she is seen in follow-up evaluation for left great toe ulcer. She is tolerating antibiotic therapy with no GI disturbance. The wound is stable. Apligraf was applied today. She has been decreasing her smoking, only had 4 cigarettes yesterday and 1 today. She continues being more compliant in diabetic diet. She will follow-up next week for evaluation of site, if stable will remove at 2 weeks. 06/28/18- She is here in follow up evalution. Apligraf was placed last week, she states the dressing fell off on Tuesday and  she was dressing with hydrofera blue. She is healed and will be discharged from the clinic today. She has been instructed to continue with smoking cessation, continue monitoring glucose levels, offloading for an additional 4 weeks and continue with hydrofera blue for additional two weeks for any possible microscopic opening. Readmission: 08/07/18 on evaluation today patient presents for reevaluation concerning  the ulcer of her right great toe. She was previously discharged on 06/28/18 healed. Nonetheless she states that this began to show signs of drainage she subsequently went to her primary care provider. Subsequently an x-ray was performed on 08/01/18 which was negative. The patient was also placed on antibiotics at that time. Fortunately they should have been effective for the infection. Nonetheless she's been experiencing some improvement but still has a lot of drainage coming from the wound itself. 08/14/18 on evaluation today patient's wound actually does show signs of improvement in regard to the erythema at this point. She has completed the antibiotics. With that being said we did discuss the possibility of placing her in a total contact cast as of today although I think that I may want to give this just a little bit more time to ensure nothing recurrence as far as her infection is concerned. I do not want to put in the cast and risk infection at that time if things are not completely resolved. With that being said she is gonna require some debridement today. 08/21/18 on evaluation today patient actually appears to be doing okay in regard to her toe ulcer. She's been tolerating the dressing changes without complication. With that being said it does appear that she is ready and in fact I think it's appropriate for [place] to go ahead and initiate the total contact cast today. Nonetheless she will require some sharp debridement to prepare the wound for application. Overall I feel like things have been progressing well but we do need to do something to get this to close more readily. 08/24/18 patient seen today for reevaluation after having had the total contact cast applied on Tuesday. She seems to have done very well the wound appears to be doing great and overall I'm pleased with the progress that she's made. There were no abnormal areas of rubbing from the cast on her lower extremity. 08/30/18 on  evaluation today patient actually appears to be completely healed in regard to her plantar toe ulcer. She tells me at this point she's been having a lot of issues with the cast. She almost fell a couple of times the state shall the step of her dog a couple times as well. This is been a very frustrating process for her other nonetheless she has completely healed the wound which is excellent news. Overall there does not appear to be the evidence of infection at this time which is great news. 09/11/18 evaluation today patient presents for follow-up concerning her great toe ulcer on the left which has unfortunately reopened since I last saw her which was only a couple of weeks ago. Unfortunately she was not able to get in to get the shoe and potentially the AFO that's gonna be necessary due to her left foot drop. She continues with offloading shoe but this is not enough to prevent her from reopening it appears. When we last had her in the total contact cast she did well from a healing standpoint but unfortunately the wound reopened as soon as she came out of the cast within just a couple of weeks. Right now the biggest  concern is that I do believe the foot drop is leading to the issue and this is gonna continue to be an issue unfortunately until we get things under control as far as the walking anomaly is concerned with the foot drop. This is also part of the reason why she falls on a regular basis. I just do not believe that is gonna be safe for [place] to reinitiate the total contact cast as last time we had this on she fell 3 times one week which is definitely not normal for her. 09/18/18 upon evaluation today the patient actually appears to be doing about the same in regard to her toe ulcer. She did not contact [employer] as I asked her to even though I had given her the prescription. In fact she actually states that she has no idea where the prescription is. She did apparently call [employer] and they told her  that all she needed to do was bring the prescription in order to be able to be seen and work on getting the AFO for her left foot. With all that being said she still does not have an appointment and I'm not sure were things stand that regard. I will give her a new prescription today in order to contact them to get this set up. [name], WEISHAUPT A. ([id number]) 09/25/18 on evaluation today patient actually appears to be doing about the same in regard to her toes ulcer. She does have a small areas which seems to have a lot of callous buildup around the edge of the wound which is going to need sharp debridement today. She still is waiting to be scheduled for evaluation with [employer] for possibility of an AFO. She states there supposed to call her tomorrow to get this set up. Unfortunately it does appear that her foot specifically the toe area is showing signs of erythema. There does not appear to be any systemic infection which is in these good news. 10/02/18 on evaluation today patient actually appears to be doing about the same in regard to her toe ulcer. This really has not done too well although it's not significantly larger it's also not significantly smaller. She has been tolerating the dressing changes without complication. She actually has her appointment with [facility] and [place] tomorrow to hopefully be measured for obtaining and AFO splint. I think this would be helpful preventing this from reoccurring. We had contemplated starting the cast this week although to be honest I am reluctant to do that as she's been having nausea, vomiting, and seizure activity over the past three days. She has a history of seizures and have been told is nothing that can be done for these. With that being said I do believe that along with the seizures have the nausea vomiting which upon further questioning doesn't seem to be the normal for her and makes me concerned for the possibility of infection or something else  going on. I discussed this with the patient and her mother during the office visit today. I do not feel the wound is effective but maybe something else. The responses this was "this just happens to her at times and we don't know why". They did not seem to be interested in going to the hospital to have this checked out further. 10/09/18 on evaluation today patient presents for follow-up concerning her ongoing toe ulcer. She has been tolerating the dressing changes without complication. Fortunately there does not appear to be any evidence of infection which is great news however  I do think that the patient would benefit from going ahead for with the total contact cast. She's actually in a wheelchair today she tells me that she will use her walker if we initiate the cast. I was very specific about the fact that if we were gonna do the cast I wanted to make sure that she was using the walker in order to prevent any falls. She tells me she does not have stairs that she has to traverse on a regular basis at her home. She has not had any seizures since last week again that something that happens to her often she tells me she did talk to [employer] and they said that it may take up to three weeks to get the brace approved for her. Hopefully that will not take that long but nonetheless in the meantime I do think the cast could be of benefit. 10/12/18 on evaluation today patient appears to be doing rather well in regard to her toe ulcer. It's just been a few days and already this is significantly improved both as far as overall appearance and size. Fortunately there's no sign of infection. She is here for her first obligatory cast change. 10/19/18 Seen today for follow up and management of left great toe ulcer. Wound continues to show improvement. Noted small open area with seroussang drainage with palpation. Denies any increased pain or recent fevers during visit. She will continue calcium alginate with  offloading shoe. Denies any questions or concerns during visit. 10/26/18 on evaluation today patient appears to be doing about the same as when I last saw her in regard to her wound bed. Fortunately there does not appear to be any signs of infection. Unfortunately she continues to have a breakdown in regard to the toe region any time that she is not in the cast. It takes almost no time at all for this to happen. Nonetheless she still has not heard anything from the brace being made by [employer] as to when exactly this will be available to her. Fortunately there is no signs of infection at this time. 10/30/18 on evaluation today patient presents for application of the total contact cast as we just received him this morning. Fortunately we are gonna be able to apply this to her today which is great news. She continues to have no significant pain which is good news. Overall I do feel like things have been improving while she was the cast is when she doesn't have a cast that things get worse. She still has not really heard anything from [place] regarding her brace. 11/02/18 upon evaluation today patient's wound already appears to be doing significantly better which is good news. Fortunately there does not appear to be any signs of infection also good news. Overall I do think the total contact cast as before is helping to heal this area unfortunately it's just not gonna likely keep the area closed and healed without her getting her brace at least. Again the foot drop is a significant issue for her. 11/09/18 on evaluation today patient appears to be doing excellent in regard to her toe ulcer which in fact is completely healed. Fortunately we finally got the situation squared away with the paperwork which was needed to proceed with getting her brace approved by Medicaid. I have filled that out unfortunately that information has been sent to the orthopedic office that I worked at 2 1/2 years ago and not tired Current  wound care measures. Fortunately she seems to be  doing very well at this time. 11/23/18 on evaluation today patient appears to be doing More Poorly Compared to Last Time I Saw Her. At [facility] She Had Completely Healed. Currently she is continuing to have issues with reopening. She states that she just found out that the brace was approved through Medicaid now she just has to go get measured in order to have this fitted for her and then made. Subsequently she does not have an appointment for this yet that is going to complicate things we obviously cannot put her back in the cast if we do not have everything measured because they're not gonna be able to measure her foot while she is in the cast. 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 Obviously this is unfortunate and does have me somewhat worried as well. 11/30/18 on evaluation today patient's toe ulcer actually appears to be doing fairly well. The good news is she will be getting her brace in the shoes next week on Wednesday. Hopefully we will be able to get this to heal without having to go back in the cast however she may need the cast in order to get the wound completely heal and then go from there. Fortunately there's no signs of infection at this time. 12/07/18 on evaluation today patient fortunately did receive her brace and she states she could tell this definitely makes her walk better. With that being said she's been having issues with her toe where she noticed yesterday there was a lot of tissue that was loosing off this appears to be much larger than what it was previous. She also states that her leg has been read putting much across the top of her foot just about the ankle although this seems to be receiving somewhat. The total area is still red and appears to be someone infected as best I can tell. She is previously taken Bactrim and that  may be a good option for her today as well. We are gonna see what I wound culture shows as well and I think that this is definitely appropriate. With that being said outside of the culture I still need to initiate something in the interim and that's what I'm gonna go ahead and select Bactrim is a good option for her. 12/14/18 on evaluation today patient appears to be doing better in regard to her left great toe ulcer as compared to last week's evaluation. There's still some erythema although this is significantly improved which is excellent news. Overall I do believe that she is making good progress is still gonna take some time before she is where I would like her to be from the standpoint of being able to place her back into the total contact cast. Hopefully we will be where we need to be by next week. 12/21/18 on evaluation today patient actually appears to be doing poorly in regard to her toe ulcer. She's been tolerating the dressing changes without complication. Fortunately there's no signs of systemic infection although she does have a lot of drainage from the toe ulcer and this does Flye, Reathel A. ([id number]) seem to be causing some issues at this point. She does have erythema on the distal portion of her toe that appears to be likely cellulitis. 12/28/18 on evaluation today patient actually appears to be doing a little better in my pinion in regard to her toe ulcer. With  that being said she still does have some evidence of infection at this time and for her culture she had both E. coli as well as enterococcus as organisms noted on evaluation. For that reason I think that though the Keflex likely has treated the E. coli rather well this has really done nothing for the enterococcus. We are going to have to initiate treatment for this specifically. 01/04/19 on evaluation today patient's toe actually appears to be doing better from the standpoint of infection. She currently would like to see about  putting the cash back on I think that this is appropriate as long as she takes care of it and keeps it from getting wet. She is gonna have some drainage we can definitely pass this up with [employer] and alginate to try to prevent as much drainage as possible from causing the problems. With that being said I do want to at least try her with the cast between now and Tuesday. If there any issues we can't continue to use it then I will discontinue the use of the cast at that point. 01/08/19 on evaluation today patient actually appears to be doing very well as far as her foot ulcer specifically the great toe on the left is concerned. She did have an area of rubbing on the medial aspect of her left ankle which again is from the cast. Fortunately there's no signs of infection at this point in this appears to be a very slight skin breakdown. The patient tells me she felt it rubbing but didn't think it was that bad. Fortunately there is no signs of active infection at this time which is good news. No fevers, chills, nausea, or vomiting noted at this time. 01/15/19 on evaluation today patient actually appears to be doing well in regard to her toe ulcer. Again as previous she seems to do well and she has the cast on which indicates to me that during the time she doesn't have a cast on she's putting way too much pressure on this region. Obviously I think that's gonna be an issue as with the current national emergency concerning the Covid-19 Virus it has been recommended that we discontinue the use of total contact casting by the chief medical officer of our company, Dr. Simona Huh. The reasoning is that if a patient becomes sick and cannot come into have the cast removed they could not just leave this on for an additional two weeks. Obviously the hospitals also do not want to receive patient's who are sick into the emergency department to potentially contaminate the region and spread the Covid-19 Virus among other sick  individuals within the hospital system. Therefore at this point we are suspending the use of total contact cast until the current emergency subsides. This was all discussed with the patient today as well. 01/22/19 on evaluation today patient's wound on her left great toe appears to be doing slightly worse than previously noted last week. She tells me that she has been on this quite a bit in fact she tells me she's been awake for 38 straight hours. This is due to the fact that she's having to care for grandparents because nobody else will. She has been taking care of them for five the last seven days since I've seen her they both have dementia his is from a stroke and her grandmother's was progressive. Nonetheless she states even her mom who knows her condition and situation has only help two of those days to take care of  them she's been taking care of the rest. Fortunately there does not appear to be any signs of active infection in regard to her toe at this point although obviously it doesn't look as good as it did previous. I think this is directly related to her not taking off the pressure and friction by way of taking things easy. Though I completely understand what's going on. 01/29/19 on evaluation today patient's tools are actually appears to be showing some signs of improvement today compared to last week's evaluation as far as not necessarily the overall size of the wound but the fact that she has some new skin growth in between the two ends of the wound opening. Overall I feel like she has done well she states that she had a family member give her what sounds to be a CAM walker boot which has been helpful as well. 02/05/19 on evaluation today patient's wound bed actually appears to be doing significantly better in regard to her overall appearance of the size of the wound. With that being said she is still having an issue with offloading efficiently enough to get this to close. Apparently there is  some signs of infection at this point as well unfortunately. Previously she's done well of Augmentin I really do not see anything that needs to be culture currently but there theme and cellulitis of the foot that I'm seeing I'm gonna go ahead and place her on an antibiotic today to try to help clear this up. 02/12/2019 on evaluation today patient actually appears to be doing poorly in regard to her overall wound status. She tells me she has been using her offloading shoe but actually comes in today wearing her tennis shoe with the AFO brace. Again as I previously discussed with her this is really not sufficient to allow the area to heal appropriately. Nonetheless she continues to be somewhat noncompliant and I do wonder based on what she has told my nurse in the past as to whether or not a good portion of this noncompliance may be recreational drug and alcohol related. She has had a history of heroin overdose and this was fairly recently in the past couple of months that have been seeing her. Nonetheless overall I feel like her wound looks significantly worse today compared to what it was previous. She still has significant erythema despite the Augmentin I am not sure that this is an appropriate medication for her infection I am also concerned that the infection may have gone down into her bone. 02/19/19 on evaluation today patient actually appears to be doing about the same in regard to her toe ulcer. Unfortunately she continues to show signs of bone exposure and infection at this point. There does not appear to be any evidence of worsening of the infection but I'm also not really sure that it's getting significantly better. She is on the Augmentin which should be sufficient for the Staphylococcus aureus infection that she has at this point. With that being said she may need IV antibiotics to more appropriately treat this. We did have a discussion today about hyperbaric option therapy. 02/28/19 on  evaluation today patient actually appears to be doing much worse in regard to the wound on her left great toe as compared to even my previous evaluation last week. Unfortunately this seems to be training in a pretty poor direction. Her toe was actually now starting to angle laterally and I can actually see the entire joint area of the proximal portion of  the digit where is the distal portion of the digit again is no longer even in contact with the joint line. Unfortunately there's a lot more necrotic tissue around the edge and the toe appears to be showing signs of becoming gangrenous in my pinion. I'm very concerned about were things stand at this point. She did see infectious disease and they are planning to send in a prescription for Sivextro for her and apparently this has been approved. With that being said I don't think she should avoid taking this but at the same time I'm not sure that it's gonna be sufficient to save her toe at this point. 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 03/05/19 on evaluation today patient actually appears to be doing quite well in regard to her toe all things considering. She still has bone exposed but there appears to be much less your thing on overall the appearance of the wound and the toe itself is dramatically improved. She still does have some issues currently obviously with infection she did see vascular as well and there concerned that her blood flow to the toad. For that reason they are setting up for an angiogram next week. 03/14/19 on evaluation today patient appears to be doing very poor in regard to her toe and specifically in regard to the ulceration and the fact that she's starting to notice the toe was leaning even more towards the lateral aspect  and the complete joint is visible on the proximal aspect of the joint. Nonetheless she's also noted a significant odor and the tip of the toe is turning more dark and necrotic appearing. Overall I think she is getting worse not better as far as this is concerned. For that reason I am recommending at this point that she likely needs to be seen for likely amputation. AIZAH, ESSELMAN ([id number]) READMISSION 03/19/2021 This is a patient that we cared for in this clinic for a prolonged period of time in 2019 and 2020 with a left foot and left first toe wound. 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 Luana Shu. In December 2021 she had an ulcer on her right great toe as well as the fourth and fifth toes. 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 She also had an angiogram at that time and underwent angioplasty of the right anterior tibial artery. In any case she claims that the wound on the right foot is closed I did not look at this today which was probably an oversight although I think that should be done next week. After her surgery she developed a dehiscence but I do not see any follow-up of this. According to Dr. Deborra Medina last review that she was out of the area being cared for by another physician but recently came back to his attention. 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 A culture of this area showed E. coli apparently before she came back to see Dr. Luana Shu she was supposed to be receiving antibiotics but she did not really take them. Nor is she offloading this area at all. Finally her last hemoglobin A1c listed in  epic was in March 2022 at 14.1 she says things are a lot better since then although I am not sure. 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 She had this wound at the time. During this admission she had E. coli sepsis a TEE was negative. 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 She is still a heavy cigarette smoker. She has a history of bipolar disorder seizure disorder ABI in our clinic was 1.05 6/1; left midfoot in the setting of a TMA done previously. Round circular wound with a "knuckle" of protruding tissue. The problem is that the knuckle was not attached to any of the surrounding granulation and this probed proximally widely I removed a large portion of this tissue. This wound goes with considerable undermining laterally. I do not feel any bone there was no purulence but this is a deep wound. 6/8; in spite of the debridement I did last week. She arrives with a wound looking exactly the same. A protruding "knuckle" of tissue nonadherent to most of the surrounding tissue. There is considerable depth around this from 6-12 o'clock at 2.7 cm and undermining of 1 cm. This does not look overtly infected and the x-ray I did last week was negative for any osseous abnormalities. We have been using silver collagen 6/15; deep tissue culture I did last week showed moderate staph aureus and moderate Pseudomonas. This will definitely require prolonged antibiotic therapy. The pathology on the protuberant area was negative for malignancy fungus etc. 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 We have been using silver collagen. I am going to be prescribing Levaquin for 2 weeks. Her CT scan of the foot is down for 7/5 6/22; CT scan of the foot on 7 5. She says she has hardware in the left leg from her previous  fracture. She is on the Levaquin for the deep tissue culture I did that showed methicillin sensitive staph aureus and Pseudomonas. I gave her a 2-week supply and she will have another week. She arrives in clinic today with the same protuberant tissue however this is nonadherent to the tissue surrounding it. I am really at a loss to explain this unless there is underlying deep tissue infection 6/29; patient presents for 1 week follow-up. She has been using collagen to the wound bed. She reports taking her antibiotics as prescribed.She has no complaints or issues today. She denies signs of infection. 7/6; patient presents for one week followup. She has been using collagen to the wound bed. She states she is taking Levaquin however at times she is not able to keep it down. She denies signs of infection. Patient History Information obtained from Patient. Social History Current every day smoker, Marital Status - Single, Alcohol Use - Never, Drug Use - Prior History, Caffeine Use - Daily. Medical History Ear/Nose/Mouth/Throat Patient has history of Chronic sinus problems/congestion, Middle ear problems Hematologic/Lymphatic Patient has history of Anemia Respiratory Patient has history of Chronic Obstructive Pulmonary Disease (COPD) Cardiovascular Patient has history of Congestive Heart Failure - EF 33% Endocrine Patient has history of Type II Diabetes Genitourinary Patient has history of End Stage Renal Disease Integumentary (Skin) Patient has history of History of pressure wounds  Neurologic Patient has history of Neuropathy - feel and legs Objective Kurdziel, Angelic A. ([id number]) Constitutional Vitals Time Taken: 1:11 PM, Height: 69 in, Weight: 185 lbs, BMI: 27.3, Temperature: 98.3 F, Pulse: 112 bpm, Respiratory Rate: 18 breaths/min, Blood Pressure: 100/61 mmHg. General Notes: Left foot: Wound to the plantar aspect that that has some granulation tissue and nonviable tissue present. Most  of the tissue is pale tissue and overall does not look very healthy. There is significant callus circumferentially. No surrounding signs of infection Integumentary (Hair, Skin) Wound #7 status is Open. Original cause of wound was Gradually Appeared. The date acquired was: 08/07/2020. The wound has been in treatment 5 weeks. The wound is located on the [place]. The wound measures 1.7cm length x 1.8cm width x 1cm depth; 2.403cm^2 area and 2.403cm^3 volume. There is Fat Layer (Subcutaneous Tissue) exposed. Tunneling has been noted at 10:00 with a maximum distance of 2cm. Undermining begins at 12:00 and ends at 12:00 with a maximum distance of 0.6cm. There is a medium amount of serosanguineous drainage noted. The wound margin is epibole. There is medium (34-66%) pink granulation within the wound bed. There is a medium (34-66%) amount of necrotic tissue within the wound bed including Adherent Slough. Assessment Active Problems ICD-10 Type 2 diabetes mellitus with foot ulcer 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ity Type 2 diabetes mellitus with diabetic polyneuropathy Patient's wound is stable. I debrided some nonviable tissue. She had a CT scan done that showed soft tissue swelling suggesting cellulitis. There was no soft tissue gas or destructive changes suggesting osteomyelitis. I recommended she take nausea medicine with her antibiotic to help keep it down. There was significant tunneling to the wound and I will change the dressing from collagen to silver alginate to help pack this area better. I will see her back in a week Procedures Wound #7 Pre-procedure diagnosis of Wound #7 is a Pressure Ulcer located on the Left,Midline,Plantar Foot . There was a Excisional Skin/Subcutaneous Tissue Debridement with a total area of 3.06 sq cm performed by Kalman Shan, MD. With the following instrument(s): Curette to remove Viable and  Non-Viable tissue/material. Material removed includes Eschar, Callus, Subcutaneous Tissue, and Slough after achieving pain control using Lidocaine 2% Topical Gel. A time out was conducted at 13:32, prior to the start of the procedure. A Minimum amount of bleeding was controlled with Pressure. The procedure was tolerated well. Post Debridement Measurements: 1.7cm length x 1.8cm width x 1cm depth; 2.403cm^3 volume. Post debridement Stage noted as Category/Stage II. Character of Wound/Ulcer Post Debridement is improved. Post procedure Diagnosis Wound #7: Same as Pre-Procedure Plan Follow-up Appointments: Return Appointment in 1 week. Bathing/ Shower/ Hygiene: Clean wound with Normal Saline or wound cleanser. Off-Loading: Open toe surgical shoe with peg assist. Medications-Please add to medication list.: P.O. Antibiotics - Levaquin continue WOUND #7: - Foot Wound Laterality: Plantar, Left, Midline Cleanser: Soap and Water Every Other Day/30 Days Discharge Instructions: Gently cleanse wound with antibacterial soap, rinse and pat dry prior to dressing wounds Peri-Wound Care: Desitin Maximum Strength Ointment 4 (oz) Every Other Day/30 Days Discharge Instructions: apply around the outside of wound to protect skin Primary Dressing: AquacelAg Advantage Dressing, 2X2 (in/in) Every Other Day/30 Days Schlossberg, Hasel A. ([id number]) Discharge Instructions: Apply to wound and pack into the sides that tunnel under the edge of the wound Secondary Dressing: ABD Pad 5x9 (in/in) Every Other Day/30 Days Discharge Instructions: Cover with ABD pad Secured With: 56M Medipore H  Soft Cloth Surgical Tape, 2x2 (in/yd) Every Other Day/30 Days Discharge Instructions: tape to secure the kerlix/rolled gauze Secured With: Kerlix Roll Sterile or Non-Sterile 6-ply 4.5x4 (yd/yd) Every Other Day/30 Days Discharge Instructions: Apply Kerlix as directed 1. In office sharp debridement 2. Silver alginate 3. Follow-up in 1  week Electronic Signature(s) Signed: 04/28/2021 2:19:57 PM By: Kalman Shan DO Entered By: Kalman Shan on 04/28/2021 14:19:16 [name] ([id number]) -------------------------------------------------------------------------------- ROS/PFSH Details Patient Name: Heather Dear A. Date of Service: 04/28/2021 1:00 PM Medical Record Number: [id number] Patient Account Number: [id number] Date of Birth/Sex: [birth year] (45 y.o. F) Treating RN: Dolan Amen Primary Care Provider: Raelene Bott Other Clinician: Referring Provider: Raelene Bott Treating Provider/Extender: Yaakov Guthrie in Treatment: 5 Information Obtained From Patient Ear/Nose/Mouth/Throat Medical History: Positive for: Chronic sinus problems/congestion; Middle ear problems Hematologic/Lymphatic Medical History: Positive for: Anemia Respiratory Medical History: Positive for: Chronic Obstructive Pulmonary Disease (COPD) Cardiovascular Medical History: Positive for: Congestive Heart Failure - EF 33% Endocrine Medical History: Positive for: Type II Diabetes Time with diabetes: 14 years Treated with: Insulin, Oral agents Blood sugar tested every day: No Blood sugar testing results: Bedtime: 176 Genitourinary Medical History: Positive for: End Stage Renal Disease Integumentary (Skin) Medical History: Positive for: History of pressure wounds Neurologic Medical History: Positive for: Neuropathy - feel and legs HBO Extended History Items Ear/Nose/Mouth/Throat: Ear/Nose/Mouth/Throat: Chronic sinus problems/congestion Middle ear problems Immunizations Pneumococcal Vaccine: Received Pneumococcal Vaccination: No Spera, Anntonette A. ([id number]) Implantable Devices No devices added Family and Social History Current every day smoker; Marital Status - Single; Alcohol Use: Never; Drug Use: Prior History; Caffeine Use: Daily Electronic Signature(s) Signed: 04/28/2021 2:19:57 PM By: Kalman Shan  DO Signed: 04/28/2021 5:07:08 PM By: Dolan Amen RN Entered By: Kalman Shan on 04/28/2021 14:15:19 [name] ([id number]) -------------------------------------------------------------------------------- [place] Details Patient Name: Heather Dear A. Date of Service: 04/28/2021 Medical Record Number: [id number] Patient Account Number: [id number] Date of Birth/Sex: 01-Feb-1976 (45 y.o. F) Treating RN: Donnamarie Poag Primary Care Provider: Raelene Bott Other Clinician: Referring Provider: Raelene Bott Treating Provider/Extender: Yaakov Guthrie in Treatment: 5 Diagnosis Coding ICD-10 Codes Code Description E11.621 Type 2 diabetes mellitus with foot ulcer 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ity E11.42 Type 2 diabetes mellitus with diabetic polyneuropathy Facility Procedures CPT4 Code: [id number] Description: [id number] - DEB SUBQ TISSUE 20 SQ CM/< Modifier: Quantity: 1 CPT4 Code: Description: ICD-10 Diagnosis Description 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 Modifier: severity Quantity: Physician Procedures CPT4 Code: [id number] Description: 11042 - WC PHYS SUBQ TISS 20 SQ CM Modifier: Quantity: 1 CPT4 Code: Description: ICD-10 Diagnosis Description 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 Modifier: severity Quantity: Electronic Signature(s) Signed: 04/28/2021 2:19:57 PM By: Kalman Shan DO Entered By: Kalman Shan on 04/28/2021 14:19:28

## 2021-05-05 ENCOUNTER — Other Ambulatory Visit: Payer: Self-pay

## 2021-05-05 ENCOUNTER — Encounter (HOSPITAL_BASED_OUTPATIENT_CLINIC_OR_DEPARTMENT_OTHER): Payer: Medicaid Other | Admitting: Internal Medicine

## 2021-05-05 DIAGNOSIS — L97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ity: Secondary | ICD-10-CM

## 2021-05-05 DIAGNOSIS — E11621 Type 2 diabetes mellitus with foot ulcer: Secondary | ICD-10-CM | POA: Diagnosis not present

## 2021-05-07 NOTE — Progress Notes (Addendum)
Desrochers, [name] ([id number]) Visit Report for 05/05/2021 Arrival Information Details Patient Name: [name], [name] Date of Service: 05/05/2021 1:00 PM Medical Record Number: [id number] Patient Account Number: [id number] Date of Birth/Sex: 01/30/1976 (45 y.o. F) Treating RN: Bishop, Joy Primary Care : Hoffman, [name] Other Clinician: Referring : Hoffman, [name] Treating /Extender: Hoffman, Jessica Weeks in Treatment: 6 Visit Information History Since Last Visit Added or deleted any medications: No Patient Arrived: Wheel Chair Had [name] fall or experienced change in No Arrival Time: 13:07 activities of daily living that may affect Accompanied By: mother risk of falls: Transfer Assistance: EasyPivot Patient Lift Hospitalized since last visit: No Patient Identification Verified: Yes Pain Present Now: Yes Patient Requires Transmission-Based No Precautions: Patient Has Alerts: Yes Patient Alerts: Patient on Blood Thinner 81mg apirin Diabetic Type II Electronic Signature(s) Signed: 05/06/2021 4:28:40 PM By: Bishop, Joy Entered By: Bishop, Joy on 05/05/2021 13:09:09 Cargo, Esly [name]. ([id number]) -------------------------------------------------------------------------------- Clinic Level of Care Assessment Details Patient Name: [name] Date of Service: 05/05/2021 1:00 PM Medical Record Number: [id number] Patient Account Number: [id number] Date of Birth/Sex: 04/28/1976 (45 y.o. F) Treating RN: Bishop, Joy Primary Care : Hoffman, [name] Other Clinician: Referring : Hoffman, [name] Treating /Extender: Hoffman, Jessica Weeks in Treatment: 6 Clinic Level of Care Assessment Items TOOL 1 Quantity Score [] - Use when EandM and Procedure is performed on INITIAL visit 0 ASSESSMENTS - Nursing Assessment / Reassessment [] - General Physical Exam (combine w/ comprehensive assessment (listed just below) when performed on new 0 pt. evals) []  - 0 Comprehensive Assessment (HX, ROS, Risk Assessments, Wounds Hx, etc.) ASSESSMENTS - Wound and Skin Assessment / Reassessment [] - Dermatologic / Skin Assessment (not related to wound area) 0 ASSESSMENTS - Ostomy and/or Continence Assessment and Care [] - Incontinence Assessment and Management 0 [] - 0 Ostomy Care Assessment and Management (repouching, etc.) PROCESS - Coordination of Care [] - Simple Patient / Family Education for ongoing care 0 [] - 0 Complex (extensive) Patient / Family Education for ongoing care [] - 0 Staff obtains Consents, Records, Test Results / Process Orders [] - 0 Staff telephones HHA, Nursing Homes / Clarify orders / etc [] - 0 Routine Transfer to another Facility (non-emergent condition) [] - 0 Routine Hospital Admission (non-emergent condition) [] - 0 New Admissions / Insurance Authorizations / Ordering NPWT, Apligraf, etc. [] - 0 Emergency Hospital Admission (emergent condition) PROCESS - Special Needs [] - Pediatric / Minor Patient Management 0 [] - 0 Isolation Patient Management [] - 0 Hearing / Language / Visual special needs [] - 0 Assessment of Community assistance (transportation, D/C planning, etc.) [] - 0 Additional assistance / Altered mentation [] - 0 Support Surface(s) Assessment (bed, cushion, seat, etc.) INTERVENTIONS - Miscellaneous [] - External ear exam 0 [] - 0 Patient Transfer (multiple staff / Hoyer Lift / Similar devices) [] - 0 Simple Staple / Suture removal (25 or less) [] - 0 Complex Staple / Suture removal (26 or more) [] - 0 Hypo/Hyperglycemic Management (do not check if billed separately) [] - 0 Ankle / Brachial Index (ABI) - do not check if billed separately Has the patient been seen at the hospital within the last three years: Yes Total Score: 0 Level Of Care: ____ Posas, Jasey [name]. ([id number]) Electronic Signature(s) Signed: 05/06/2021 4:28:40 PM By: Bishop, Joy Entered By: Bishop, Joy on 05/05/2021  13:56:15 Pennisi, Bristal [name]. ([id number]) -------------------------------------------------------------------------------- Encounter Discharge Information Details Patient Name: [name], [name] Date of Service: 05/05/2021 1:00   PM Medical Record Number: [id number] Patient Account Number: [id number] Date of Birth/Sex: 07/27/1976 (45 y.o. F) Treating RN: Bishop, Joy Primary Care : Hoffman, [name] Other Clinician: Referring : Hoffman, [name] Treating /Extender: Hoffman, Jessica Weeks in Treatment: 6 Encounter Discharge Information Items Post Procedure Vitals Discharge Condition: Stable Temperature (F): 98.9 Ambulatory Status: Wheelchair Pulse (bpm): 83 Discharge Destination: Home Respiratory Rate (breaths/min): 16 Transportation: Private Auto Blood Pressure (mmHg): 95/65 Accompanied By: mother Schedule Follow-up Appointment: Yes Clinical Summary of Care: Electronic Signature(s) Signed: 05/06/2021 4:28:40 PM By: Bishop, Joy Entered By: Bishop, Joy on 05/05/2021 14:08:21 Gladwell, Rockelle [name]. ([id number]) -------------------------------------------------------------------------------- Lower Extremity Assessment Details Patient Name: [name], [name] Date of Service: 05/05/2021 1:00 PM Medical Record Number: [id number] Patient Account Number: [id number] Date of Birth/Sex: 06/01/1976 (45 y.o. F) Treating RN: Bishop, Joy Primary Care : Hoffman, [name] Other Clinician: Referring : Hoffman, [name] Treating /Extender: Hoffman, Jessica Weeks in Treatment: 6 Edema Assessment Assessed: [Left: Yes] [Right: Yes] Edema: [Left: No] [Right: No] Electronic Signature(s) Signed: 05/06/2021 4:28:40 PM By: Bishop, Joy Entered By: Bishop, Joy on 05/05/2021 13:22:24 Enriquez, Song [name]. ([id number]) -------------------------------------------------------------------------------- Multi Wound Chart Details Patient Name: [name], [name] Date of Service: 05/05/2021 1:00  PM Medical Record Number: [id number] Patient Account Number: [id number] Date of Birth/Sex: 02/04/1976 (45 y.o. F) Treating RN: Bishop, Joy Primary Care : Hoffman, [name] Other Clinician: Referring : Hoffman, [name] Treating /Extender: Hoffman, Jessica Weeks in Treatment: 6 Vital Signs Height(in): 69 Pulse(bpm): 83 Weight(lbs): 185 Blood Pressure(mmHg): 95/65 Body Mass Index(BMI): 27 Temperature(°F): 98.9 Respiratory Rate(breaths/min): 16 Photos: [N/[name]:N/[name]] Wound Location: Left, Midline, Plantar Foot N/[name] N/[name] Wounding Event: Gradually Appeared N/[name] N/[name] Primary Etiology: Pressure Ulcer N/[name] N/[name] Comorbid History: Chronic sinus problems/congestion, N/[name] N/[name] Middle ear problems, Anemia, Chronic Obstructive Pulmonary Disease (COPD), Congestive Heart Failure, Type II Diabetes, End Stage Renal Disease, History of pressure wounds, Neuropathy Date Acquired: 08/07/2020 N/[name] N/[name] Weeks of Treatment: 6 N/[name] N/[name] Wound Status: Open N/[name] N/[name] Measurements L x W x D (cm) 1.7x1.8x0.9 N/[name] N/[name] Area (cm) : 2.403 N/[name] N/[name] Volume (cm) : 2.163 N/[name] N/[name] % Reduction in Area: 10.00% N/[name] N/[name] % Reduction in Volume: -62.00% N/[name] N/[name] Position 1 (o'clock): 10 Maximum Distance 1 (cm): 1.5 Starting Position 1 (o'clock): 12 Ending Position 1 (o'clock): 12 Maximum Distance 1 (cm): 0.4 Tunneling: Yes N/[name] N/[name] Undermining: Yes N/[name] N/[name] Classification: Category/Stage II N/[name] N/[name] Exudate Amount: Medium N/[name] N/[name] Exudate Type: Serosanguineous N/[name] N/[name] Exudate Color: red, brown N/[name] N/[name] Wound Margin: Epibole N/[name] N/[name] Granulation Amount: Medium (34-66%) N/[name] N/[name] Granulation Quality: Pink N/[name] N/[name] Necrotic Amount: Medium (34-66%) N/[name] N/[name] Exposed Structures: Fat Layer (Subcutaneous Tissue): N/[name] N/[name] Yes Fascia: No Tendon: No Muscle: No Joint: No Bone: No Epithelialization: None N/[name] N/[name] Debridement: Debridement - Excisional N/[name] N/[name] 13:49 N/[name] N/[name] Hover, Charlissa [name]. ([id number]) Pre-procedure  Verification/Time Out Taken: Tissue Debrided: Callus, Subcutaneous, Slough N/[name] N/[name] Level: Skin/Subcutaneous Tissue N/[name] N/[name] Debridement Area (sq cm): 3.06 N/[name] N/[name] Instrument: Curette N/[name] N/[name] Bleeding: Minimum N/[name] N/[name] Hemostasis Achieved: Pressure N/[name] N/[name] Debridement Treatment Procedure was tolerated well N/[name] N/[name] Response: Post Debridement 1.7x1.9x0.9 N/[name] N/[name] Measurements L x W x D (cm) Post Debridement Volume: 2.283 N/[name] N/[name] (cm) Post Debridement Stage: Category/Stage II N/[name] N/[name] Procedures Performed: Debridement N/[name] N/[name] Treatment Notes Wound #7 (Foot) Wound Laterality: Plantar, Left, Midline Cleanser Soap and Water Discharge Instruction: Gently cleanse wound with antibacterial soap, rinse and pat dry prior to dressing wounds Peri-Wound Care Desitin Maximum Strength Ointment 4 (oz) Discharge Instruction: peri wound Topical   Gentamicin Discharge Instruction: Apply to wound bed and tunnel area under the dressing Primary Dressing Silvercel Small 2x2 (in/in) Discharge Instruction: pack into tunnel and touch wound bedApply Silvercel Small 2x2 (in/in) as instructed Secondary Dressing ABD Pad 5x9 (in/in) Discharge Instruction: Cover with ABD pad on top of dressing Gauze Discharge Instruction: to hold dressing in place Secured With Kerlix Roll Sterile or Non-Sterile 6-ply 4.5x4 (yd/yd) Discharge Instruction: Apply Kerlix as directed Compression Wrap Compression Stockings Add-Ons Electronic Signature(s) Signed: 05/07/2021 2:55:14 PM By: Hoffman, Jessica DO Entered By: Hoffman, Jessica on 05/05/2021 15:03:21 Halls, Bernese [name]. ([id number]) -------------------------------------------------------------------------------- Multi-Disciplinary Care Plan Details Patient Name: Mitchelle, Donia [name]. Date of Service: 05/05/2021 1:00 PM Medical Record Number: [id number] Patient Account Number: [id number] Date of Birth/Sex: 04/17/1976 (45 y.o. F) Treating RN: Bishop, Joy Primary Care :  Hoffman, [name] Other Clinician: Referring : Hoffman, [name] Treating /Extender: Hoffman, Jessica Weeks in Treatment: 6 Active Inactive Wound/Skin Impairment Nursing Diagnoses: Knowledge deficit related to ulceration/compromised skin integrity Goals: Patient/caregiver will verbalize understanding of skin care regimen Date Initiated: 03/19/2021 Date Inactivated: 04/28/2021 Target Resolution Date: 04/19/2021 Goal Status: Met Ulcer/skin breakdown will have [name] volume reduction of 30% by week 4 Date Initiated: 03/19/2021 Date Inactivated: 04/28/2021 Target Resolution Date: 04/19/2021 Goal Status: Unmet Unmet Reason: con't tx Ulcer/skin breakdown will have [name] volume reduction of 50% by week 8 Date Initiated: 03/19/2021 Target Resolution Date: 05/19/2021 Goal Status: Active Ulcer/skin breakdown will have [name] volume reduction of 80% by week 12 Date Initiated: 03/19/2021 Target Resolution Date: 06/19/2021 Goal Status: Active Ulcer/skin breakdown will heal within 14 weeks Date Initiated: 03/19/2021 Target Resolution Date: 07/20/2021 Goal Status: Active Interventions: Assess patient/caregiver ability to obtain necessary supplies Assess patient/caregiver ability to perform ulcer/skin care regimen upon admission and as needed Assess ulceration(s) every visit Notes: Electronic Signature(s) Signed: 05/06/2021 4:28:40 PM By: Bishop, Joy Entered By: Bishop, Joy on 05/05/2021 13:22:45 Adell, Jaydyn [name]. ([id number]) -------------------------------------------------------------------------------- Pain Assessment Details Patient Name: Ruble, Aleece [name]. Date of Service: 05/05/2021 1:00 PM Medical Record Number: [id number] Patient Account Number: [id number] Date of Birth/Sex: 01/29/1976 (45 y.o. F) Treating RN: Bishop, Joy Primary Care : Hoffman, [name] Other Clinician: Referring : Hoffman, [name] Treating /Extender: Hoffman, Jessica Weeks in Treatment: 6 Active  Problems Location of Pain Severity and Description of Pain Patient Has Paino Yes Site Locations Pain Location: Pain in Ulcers Rate the pain. Current Pain Level: 8 Pain Management and Medication Current Pain Management: Electronic Signature(s) Signed: 05/06/2021 4:28:40 PM By: Bishop, Joy Entered By: Bishop, Joy on 05/05/2021 13:14:42 Hindle, Lakea [name]. ([id number]) -------------------------------------------------------------------------------- Patient/Caregiver Education Details Patient Name: Berrios, Arika [name]. Date of Service: 05/05/2021 1:00 PM Medical Record Number: [id number] Patient Account Number: [id number] Date of Birth/Gender: 11/24/1975 (45 y.o. F) Treating RN: Bishop, Joy Primary Care Physician: Hoffman, [name] Other Clinician: Referring Physician: Hoffman, [name] Treating Physician/Extender: Hoffman, Jessica Weeks in Treatment: 6 Education Assessment Education Provided To: Patient and Caregiver Education Topics Provided Basic Hygiene: Infection: Offloading: Wound/Skin Impairment: Methods: Demonstration, Explain/Verbal Responses: State content correctly Electronic Signature(s) Signed: 05/06/2021 4:28:40 PM By: Bishop, Joy Entered By: Bishop, Joy on 05/05/2021 13:56:48 Yeh, Daisi [name]. ([id number]) -------------------------------------------------------------------------------- Wound Assessment Details Patient Name: Walen, Linday [name]. Date of Service: 05/05/2021 1:00 PM Medical Record Number: [id number] Patient Account Number: [id number] Date of Birth/Sex: 11/13/1975 (45 y.o. F) Treating RN: Bishop, Joy Primary Care : Hoffman, [name] Other Clinician: Referring : Hoffman, [name] Treating /Extender: Hoffman, Jessica Weeks in Treatment: 6 Wound Status Wound Number: 7 Primary Pressure Ulcer Etiology: Wound Location: Left, Midline, Plantar Foot   Wound Open Wounding Event: Gradually Appeared Status: Date Acquired: 08/07/2020 Comorbid Chronic sinus  problems/congestion, Middle ear problems, Weeks Of Treatment: 6 History: Anemia, Chronic Obstructive Pulmonary Disease (COPD), Clustered Wound: No Congestive Heart Failure, Type II Diabetes, End Stage Renal Disease, History of pressure wounds, Neuropathy Photos Wound Measurements Length: (cm) 1.7 % Width: (cm) 1.8 % Depth: (cm) 0.9 Ep Area: (cm) 2.403 T Volume: (cm) 2.163 Reduction in Area: 10% Reduction in Volume: -62% ithelialization: None unneling: Yes Position (o'clock): 10 Maximum Distance: (cm) 1.5 Undermining: Yes Starting Position (o'clock): 12 Ending Position (o'clock): 12 Maximum Distance: (cm) 0.4 Wound Description Classification: Category/Stage II F Wound Margin: Epibole S Exudate Amount: Medium Exudate Type: Serosanguineous Exudate Color: red, brown oul Odor After Cleansing: No lough/Fibrino Yes Wound Bed Granulation Amount: Medium (34-66%) Exposed Structure Granulation Quality: Pink Fascia Exposed: No Necrotic Amount: Medium (34-66%) Fat Layer (Subcutaneous Tissue) Exposed: Yes Necrotic Quality: Adherent Slough Tendon Exposed: No Muscle Exposed: No Joint Exposed: No Bone Exposed: No Lamke, Kamika [name]. ([id number]) Treatment Notes Wound #7 (Foot) Wound Laterality: Plantar, Left, Midline Cleanser Soap and Water Discharge Instruction: Gently cleanse wound with antibacterial soap, rinse and pat dry prior to dressing wounds Peri-Wound Care Desitin Maximum Strength Ointment 4 (oz) Discharge Instruction: peri wound Topical Gentamicin Discharge Instruction: Apply to wound bed and tunnel area under the dressing Primary Dressing Silvercel Small 2x2 (in/in) Discharge Instruction: pack into tunnel and touch wound bedApply Silvercel Small 2x2 (in/in) as instructed Secondary Dressing ABD Pad 5x9 (in/in) Discharge Instruction: Cover with ABD pad on top of dressing Gauze Discharge Instruction: to hold dressing in place Secured With Kerlix Roll Sterile or  Non-Sterile 6-ply 4.5x4 (yd/yd) Discharge Instruction: Apply Kerlix as directed Compression Wrap Compression Stockings Add-Ons Electronic Signature(s) Signed: 05/06/2021 4:28:40 PM By: Bishop, Joy Entered By: Bishop, Joy on 05/05/2021 13:21:58 Lozano, Lonnetta [name]. ([id number]) -------------------------------------------------------------------------------- Vitals Details Patient Name: Mcwilliams, Beola [name]. Date of Service: 05/05/2021 1:00 PM Medical Record Number: [id number] Patient Account Number: [id number] Date of Birth/Sex: 05/29/1976 (45 y.o. F) Treating RN: Bishop, Joy Primary Care : Hoffman, [name] Other Clinician: Referring : Hoffman, [name] Treating /Extender: Hoffman, Jessica Weeks in Treatment: 6 Vital Signs Time Taken: 13:10 Temperature (°F): 98.9 Height (in): 69 Pulse (bpm): 83 Weight (lbs): 185 Respiratory Rate (breaths/min): 16 Body Mass Index (BMI): 27.3 Blood Pressure (mmHg): 95/65 Reference Range: 80 - 120 mg / dl Notes stated she only had several sips of mt dew today. gave water to drink Electronic Signature(s) Signed: 05/06/2021 4:28:40 PM By: Bishop, Joy Entered By: Bishop, Joy on 05/05/2021 13:14:09 

## 2021-05-07 NOTE — Progress Notes (Addendum)
[name], [name] ([id number]) Visit Report for 05/05/2021 Chief Complaint Document Details Patient Name: NIKALA, NGHIEM A. Date of Service: 05/05/2021 1:00 PM Medical Record Number: [id number] Patient Account Number: [id number] Date of Birth/Sex: Jan 29, 1976 (45 y.o. F) Treating RN: Cornell Barman Primary Care Provider: Raelene Bott Other Clinician: Referring Provider: Raelene Bott Treating Provider/Extender: Yaakov Guthrie in Treatment: 6 Information Obtained from: Patient Chief Complaint Left great toe ulcer 03/19/2021; patient referred by Dr. Luana Shu who has been looking after her left foot for quite a period of time for review of a nonhealing area in the left midfoot Electronic Signature(s) Signed: 05/07/2021 2:55:14 PM By: Kalman Shan DO Entered By: Kalman Shan on 05/05/2021 15:03:46 [name], [name] ([id number]) -------------------------------------------------------------------------------- Debridement Details Patient Name: Heather Dear A. Date of Service: 05/05/2021 1:00 PM Medical Record Number: [id number] Patient Account Number: [id number] Date of Birth/Sex: 07-06-1976 (45 y.o. F) Treating RN: Donnamarie Poag Primary Care Provider: Raelene Bott Other Clinician: Referring Provider: Raelene Bott Treating Provider/Extender: Yaakov Guthrie in Treatment: 6 Debridement Performed for Wound #7 Left,Midline,Plantar Foot Assessment: Performed By: Physician Kalman Shan, MD Debridement Type: Debridement Level of Consciousness (Pre- Awake and Alert procedure): Pre-procedure Verification/Time Out Yes - 13:49 Taken: Start Time: 13:49 Total Area Debrided (L x W): 1.7 (cm) x 1.8 (cm) = 3.06 (cm) Tissue and other material Viable, Non-Viable, Callus, Slough, Subcutaneous, Biofilm, Slough debrided: Level: Skin/Subcutaneous Tissue Debridement Description: Excisional Instrument: Curette Bleeding: Minimum Hemostasis Achieved: Pressure End Time:  13:51 Response to Treatment: Procedure was tolerated well Level of Consciousness (Post- Awake and Alert procedure): Post Debridement Measurements of Total Wound Length: (cm) 1.7 Stage: Category/Stage II Width: (cm) 1.9 Depth: (cm) 0.9 Volume: (cm) 2.283 Character of Wound/Ulcer Post Debridement: Improved Post Procedure Diagnosis Same as Pre-procedure Electronic Signature(s) Signed: 05/06/2021 4:28:40 PM By: Donnamarie Poag Signed: 05/07/2021 2:55:14 PM By: Kalman Shan DO Entered By: Donnamarie Poag on 05/05/2021 13:51:56 [name], [name] ([id number]) -------------------------------------------------------------------------------- HPI Details Patient Name: Heather Dear A. Date of Service: 05/05/2021 1:00 PM Medical Record Number: [id number] Patient Account Number: [id number] Date of Birth/Sex: October 31, 1975 (45 y.o. F) Treating RN: Cornell Barman Primary Care Provider: Raelene Bott Other Clinician: Referring Provider: Raelene Bott Treating Provider/Extender: Yaakov Guthrie in Treatment: 6 History of Present Illness HPI Description: 01/18/18-She is here for initial evaluation of the left great toe ulcer. She is a poor historian in regards to timeframe in detail. She states approximately 4 weeks ago she lacerated her toe on something in the house. She followed up with her primary care who placed her on Bactrim and ultimately a second dose of Bactrim prior to coming to wound clinic. She states she has been treating the toe with peroxide, Betadine and a Band-Aid. She did not check her blood sugar this morning but checked it yesterday morning it was 327; she is unaware of a recent A1c and there are no current records. She saw Dr. she would've orthopedics last week for an old injury to the left ankle, she states he did not see her toe, nor did she bring it to his attention. She smokes approximately 1 pack cigarettes a day. Her social situation is concerning, she arrives this morning with  her mother who appears extremely intoxicated/under the influence; her mother was asked to leave the room and be monitored by the patient's grandmother. The patient's aunt then accompanied the patient and the room throughout the rest of the appointment. We had a lengthy discussion regarding the deleterious effects of uncontrolled hyperglycemia and smoking as it relates  to wound healing and overall health. She was strongly encouraged to decrease her smoking and get her diabetes under better control. She states she is currently on a diet and has cut down her [facility] consumption. The left toe is erythematous, macerated and slightly edematous with malodor present. The edema in her left foot is below her baseline, there is no erythema streaking. We will treat her with Santyl, doxycycline; we have ordered and xray, culture and provided a Peg assist surgical shoe and cultured the wound. 01/25/18-She is here in follow-up evaluation for a left great toe ulcer and presents with an abscess to her suprapubic area. She states her blood sugars remain elevated, feeling "sick" and if levels are below 250, but she is trying. She has made no attempt to decrease her smoking stating that we "can't take away her food in her cigarettes". She has been compliant with offloading using the PEG assist you. She is using Santyl daily. the culture obtained last week grew staph aureus and Enterococcus faecalis; continues on the doxycycline and Augmentin was added on Monday. The suprapubic area has erythema, no femoral variation, purple discoloration, minimal induration, was accessed with a cotton tip applicator with sanguinopurulent drainage, this was cultured, I suspect the current antibiotic treatment will cover and we will not add anything to her current treatment plan. She was advised to go to urgent care or ER with any change in redness, induration or fever. 02/01/18-She is here in follow-up evaluation for left great toe  ulcers and a new abdominal abscess from last week. She was able to use packing until earlier this week, where she "forgot it was there". She states she was feeling ill with GI symptoms last week and was not taking her antibiotic. She states her glucose levels have been predominantly less than 200, with occasional levels between 200-250. She thinks this was contributing to her GI symptoms as they have resolved without intervention. There continues to be significant laceration to left toe, otherwise it clinically looks stable/improved. There is now less superficial opening to the lateral aspect of the great toe that was residual blister. We will transition to [facility] to all wounds, she will continue her Augmentin. If there is no change or deterioration next week for reculture. 02/08/18-She is here in follow-up evaluation for left great toe ulcer and abdominal ulcer. There is an improvement in both wounds. She has been wrapping her left toe with coban, not by our direction, which has created an area of discoloration to the medial aspect; she has been advised to NOT use coban secondary to her neuropathy. She states her glucose levels have been high over this last week ranging from 200-350, she continues to smoke. She admits to being less compliant with her offloading shoe. We will continue with same treatment plan and she will follow-up next week. 02/15/18-She is here in follow-up evaluation for left great toe ulcer and abdominal ulcer. The abdominal ulcer is epithelialized. The left great toe ulcer is improved and all injury from last week using the Coban wrap is resolved, the lateral ulcer is healed. She admits to noncompliance with wearing offloading shoe and admits to glucose levels being greater than 300 most of the week. She continues to smoke and expresses no desire to quit. There is one area medially that probes deeper than it has historically, erythema to the toe and dorsal foot has  consistently waxed and waned. There is no overt signs of cellulitis or infection but we will culture the  wound for any occult infection given the new area of depth and erythema. We will hold off on sensitivities for initiation of antibiotic therapy. 02/22/18-She is here in follow up evaluation for left great toe ulcer. There is overall significant improvement in both wound appearance, erythema and edema with changes made last week. She was not initiated on antibiotic therapy. Culture obtained last week showed oxacillin sensitive staph aureus, sensitive to clindamycin. Clindamycin has been called into the pharmacy but she has been instructed to hold off on initiation secondary to overall clinical improvement and her history of antibiotic intolerance. She has been instructed to contact the clinic with any noted changes/deterioration and the wound, erythema, edema and/or pain. She will follow-up next week. She continues to smoke and her glucose levels remain elevated >250; she admits to compliance with offloading shoe 03/01/18 on evaluation today patient appears to be doing fairly well in regard to her left first toe ulcer. She has been tolerating the dressing changes with the [facility] Dressing without complication and overall this has definitely showed signs of improvement according to records as well is what the patient tells me today. I'm very pleased in that regard. She is having no pain today 03/08/18 She is here for follow up evaluation of a left great toe ulcer. She remains non-compliant with glucose control and smoking cessation; glucose levels consistently >200. She states that she got new shoe inserts/peg assist. She admits to compliance with offloading. Since my last evaluation there is significant improvement. We will switch to prisma at this time and she will follow up next week. She is noted to be tachycardic at this appointment, heart rate 120s; she has a history of heart rate 70-130  according to our records. She admits to extreme agitation r/t personal issues; she was advised to monitor her heartrate and contact her physician if it does not return to a more normal range (<100). She takes cardizem twice daily. 03/15/18-She is here in follow-up evaluation for left great toe ulcer. She remains noncompliant with glucose control and smoking cessation. She admits to compliance with wearing offloading shoe. The ulcer is improved/stable and we will continue with the same treatment plan and she will follow-up next week 03/22/18-She is here for evaluation for left great toe ulcer. There continues to be significant improvement despite recurrent hyperglycemia (over 500 yesterday) and she continues to smoke. She has been compliant with offloading and we will continue with same treatment plan and she will follow-up next week. 03/29/18-She is here for evaluation for left great toe ulcer. Despite continuing to smoke and uncontrolled diabetes she continues to improve. She is compliant with offloading shoe. We will continue with the same treatment plan and she will follow-up next week 04/05/18- She is here in follow up evaluation for a left great toe ulcer; she presents with small pustule to left fifth toe (resembles ant bite). She admits to compliance with wearing offloading shoe; continues to smoke or have uncontrolled blood glucose control. There is more callus than usual with evidence of bleeding; she denies known trauma. 04/12/18-She is here for evaluation of left great toe ulcer. Despite noncompliance with glycemic control and smoking she continues to make Ramo, Saniyyah A. ([id number]) improvement. She continues to wear offloading shoe. The pustule, that was identified last week, to the left fifth toe is resolved. She will follow-up in 2 weeks 05/03/18-she is seen in follow-up evaluation for a left great toe ulcer. She is compliant with offloading, otherwise noncompliant with glycemic control  and smoking. She has plateaued and there is minimal improvement noted. We will transition to [facility], replaced the insert to her surgical shoe and she will follow-up in one week 05/10/18- She is here in follow up evaluation for a left great toe ulcer. It appears stable despite measurement change. We will continue with same treatment plan and follow up next week. 05/24/18-She is seen in follow-up evaluation for a left great toe ulcer. She remains compliant with offloading, has made significant improvement in her diet, decreasing the amount of sugar/soda. She said her recent A1c was 10.9 which is lower than. She did see a diabetic nutritionist/educator yesterday. She continues to smoke. We will continue with the same treatment plan and she'll follow-up next week. 05/31/18- She is seen in follow-up evaluation for left great toe ulcer. She continues to remain compliant with offloading, continues to make improvement in her diet, increasing her water and decreasing the amount of sugar/soda. She does continue to smoke with no desire to quit. We will apply Prisma to the depth and Hydrofera Blue over. We have not received insurance authorization for oasis. She will follow up next week. 06/07/18-She is seen in follow-up evaluation for left great toe ulcer. It has stalled according to today's measurements although base appears stable. She says she saw a diabetic educator yesterday; her average blood sugars are less than 300 which is an improvement for her. She continues to smoke and states "that's my next step" She continues with water over soda. We will order for xray, culture and reinstate ace wrap compression prior to placing apligraf for next week. She is voicing no complaints or concerns. Her dressing will change to iodoflex over the next week in preparation for apligraf. 06/14/18-She is seen in follow-up evaluation for left great toe ulcer. Plain film x-ray performed last week was negative for  osteomyelitis. Wound culture obtained last week grew strep B and OSSA; she is initiated on keflex and cefdinir today; there is erythema to the toe which could be from ace wrap compression, she has a history of wrapping too tight and has has been encouraged to maintain ace wraps that we place today. We will hold off on application of apligraf today, will apply next week after antibiotic therapy has been initiated. She admits today that she has resumed taking a shower with her foot/toe submerged in water, she has been reminded to keep foot/toe out of the bath water. She will be seen in follow up next week 06/21/18-she is seen in follow-up evaluation for left great toe ulcer. She is tolerating antibiotic therapy with no GI disturbance. The wound is stable. Apligraf was applied today. She has been decreasing her smoking, only had 4 cigarettes yesterday and 1 today. She continues being more compliant in diabetic diet. She will follow-up next week for evaluation of site, if stable will remove at 2 weeks. 06/28/18- She is here in follow up evalution. Apligraf was placed last week, she states the dressing fell off on Tuesday and she was dressing with hydrofera blue. She is healed and will be discharged from the clinic today. She has been instructed to continue with smoking cessation, continue monitoring glucose levels, offloading for an additional 4 weeks and continue with hydrofera blue for additional two weeks for any possible microscopic opening. Readmission: 08/07/18 on evaluation today patient presents for reevaluation concerning the ulcer of her right great toe. She was previously discharged on 06/28/18 healed. Nonetheless she states that this began to show signs of drainage she  subsequently went to her primary care provider. Subsequently an x-ray was performed on 08/01/18 which was negative. The patient was also placed on antibiotics at that time. Fortunately they should have been effective for the  infection. Nonetheless she's been experiencing some improvement but still has a lot of drainage coming from the wound itself. 08/14/18 on evaluation today patient's wound actually does show signs of improvement in regard to the erythema at this point. She has completed the antibiotics. With that being said we did discuss the possibility of placing her in a total contact cast as of today although I think that I may want to give this just a little bit more time to ensure nothing recurrence as far as her infection is concerned. I do not want to put in the cast and risk infection at that time if things are not completely resolved. With that being said she is gonna require some debridement today. 08/21/18 on evaluation today patient actually appears to be doing okay in regard to her toe ulcer. She's been tolerating the dressing changes without complication. With that being said it does appear that she is ready and in fact I think it's appropriate for [place] to go ahead and initiate the total contact cast today. Nonetheless she will require some sharp debridement to prepare the wound for application. Overall I feel like things have been progressing well but we do need to do something to get this to close more readily. 08/24/18 patient seen today for reevaluation after having had the total contact cast applied on Tuesday. She seems to have done very well the wound appears to be doing great and overall I'm pleased with the progress that she's made. There were no abnormal areas of rubbing from the cast on her lower extremity. 08/30/18 on evaluation today patient actually appears to be completely healed in regard to her plantar toe ulcer. She tells me at this point she's been having a lot of issues with the cast. She almost fell a couple of times the state shall the step of her dog a couple times as well. This is been a very frustrating process for her other nonetheless she has completely healed the wound which is  excellent news. Overall there does not appear to be the evidence of infection at this time which is great news. 09/11/18 evaluation today patient presents for follow-up concerning her great toe ulcer on the left which has unfortunately reopened since I last saw her which was only a couple of weeks ago. Unfortunately she was not able to get in to get the shoe and potentially the AFO that's gonna be necessary due to her left foot drop. She continues with offloading shoe but this is not enough to prevent her from reopening it appears. When we last had her in the total contact cast she did well from a healing standpoint but unfortunately the wound reopened as soon as she came out of the cast within just a couple of weeks. 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 This is also part of the reason why she falls on a regular basis. I just do not believe that is gonna be safe for [place] to reinitiate the total contact cast as last time we had this on she fell 3 times one  week which is definitely not normal for her. 09/18/18 upon evaluation today the patient actually appears to be doing about the same in regard to her toe ulcer. She did not contact [facility] as I asked her to even though I had given her the prescription. In fact she actually states that she has no idea where the prescription is. She did apparently call [facility] and they told her that all she needed to do was bring the prescription in order to be able to be seen and work on getting the AFO for her left foot. With all that being said she still does not have an appointment and I'm not sure were things stand that regard. I will give her a new prescription today in order to contact them to get this set up. 09/25/18 on evaluation today patient actually appears  to be doing about the same in regard to her toes ulcer. She does have a small areas which seems to have a lot of callous buildup around the edge of the wound which is going to need sharp debridement today. She still is waiting to be scheduled for evaluation with [facility] for possibility of an AFO. She states there supposed to call her tomorrow to get this set up. Unfortunately it does appear that her foot specifically the toe area is showing signs of erythema. There does not appear to be any systemic infection which is in these good news. [name], [name] ([id number]) 10/02/18 on evaluation today patient actually appears to be doing about the same in regard to her toe ulcer. This really has not done too well although it's not significantly larger it's also not significantly smaller. She has been tolerating the dressing changes without complication. She actually has her appointment with [facility] and [facility] tomorrow to hopefully be measured for obtaining and AFO splint. I think this would be helpful preventing this from reoccurring. We had contemplated starting the cast this week although to be honest I am reluctant to do that as she's been having nausea, vomiting, and seizure activity over the past three days. She has a history of seizures and have been told is nothing that can be done for these. With that being said I do believe that along with the seizures have the nausea vomiting which upon further questioning doesn't seem to be the normal for her and makes me concerned for the possibility of infection or something else going on. I discussed this with the patient and her mother during the office visit today. I do not feel the wound is effective but maybe something else. The responses this was "this just happens to her at times and we don't know why". They did not seem to be interested in going to the hospital to have this checked out further. 10/09/18 on evaluation today patient presents for  follow-up concerning her ongoing toe ulcer. She has been tolerating the dressing changes without complication. Fortunately there does not appear to be any evidence of infection which is great news however I do think that the patient would benefit from going ahead for with the total contact cast. She's actually in a wheelchair today she tells me that she will use her walker if we initiate the cast. I was very specific about the fact that if we were gonna do the cast I wanted to make sure that she was using the walker in order to prevent any falls. She tells me she does not have stairs that she has to traverse on a regular basis  at her home. She has not had any seizures since last week again that something that happens to her often she tells me she did talk to [employer] and they said that it may take up to three weeks to get the brace approved for her. Hopefully that will not take that long but nonetheless in the meantime I do think the cast could be of benefit. 10/12/18 on evaluation today patient appears to be doing rather well in regard to her toe ulcer. It's just been a few days and already this is significantly improved both as far as overall appearance and size. Fortunately there's no sign of infection. She is here for her first obligatory cast change. 10/19/18 Seen today for follow up and management of left great toe ulcer. Wound continues to show improvement. Noted small open area with seroussang drainage with palpation. Denies any increased pain or recent fevers during visit. She will continue calcium alginate with offloading shoe. Denies any questions or concerns during visit. 10/26/18 on evaluation today patient appears to be doing about the same as when I last saw her in regard to her wound bed. Fortunately there does not appear to be any signs of infection. Unfortunately she continues to have a breakdown in regard to the toe region any time that she is not in the cast. It takes almost no time  at all for this to happen. Nonetheless she still has not heard anything from the brace being made by [employer] as to when exactly this will be available to her. Fortunately there is no signs of infection at this time. 10/30/18 on evaluation today patient presents for application of the total contact cast as we just received him this morning. Fortunately we are gonna be able to apply this to her today which is great news. She continues to have no significant pain which is good news. Overall I do feel like things have been improving while she was the cast is when she doesn't have a cast that things get worse. She still has not really heard anything from [facility] regarding her brace. 11/02/18 upon evaluation today patient's wound already appears to be doing significantly better which is good news. Fortunately there does not appear to be any signs of infection also good news. Overall I do think the total contact cast as before is helping to heal this area unfortunately it's just not gonna likely keep the area closed and healed without her getting her brace at least. Again the foot drop is a significant issue for her. 11/09/18 on evaluation today patient appears to be doing excellent in regard to her toe ulcer which in fact is completely healed. Fortunately we finally got the situation squared away with the paperwork which was needed to proceed with getting her brace approved by Medicaid. I have filled that out unfortunately that information has been sent to the orthopedic office that I worked at 2 1/2 years ago and not tired Current wound care measures. Fortunately she seems to be doing very well at this time. 11/23/18 on evaluation today patient appears to be doing More Poorly Compared to Last Time I Saw Her. At [facility] She Had Completely Healed. Currently she is continuing to have issues with reopening. She states that she just found out that the brace was approved through Medicaid now she just has to go get  measured in order to have this fitted for her and then made. Subsequently she does not have an appointment for this yet that is going  to complicate things we obviously cannot put her back in the cast if we do not have everything measured because they're not gonna be able to measure her foot while she is in the cast. 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 Obviously this is unfortunate and does have me somewhat worried as well. 11/30/18 on evaluation today patient's toe ulcer actually appears to be doing fairly well. The good news is she will be getting her brace in the shoes next week on Wednesday. Hopefully we will be able to get this to heal without having to go back in the cast however she may need the cast in order to get the wound completely heal and then go from there. Fortunately there's no signs of infection at this time. 12/07/18 on evaluation today patient fortunately did receive her brace and she states she could tell this definitely makes her walk better. With that being said she's been having issues with her toe where she noticed yesterday there was a lot of tissue that was loosing off this appears to be much larger than what it was previous. She also states that her leg has been read putting much across the top of her foot just about the ankle although this seems to be receiving somewhat. The total area is still red and appears to be someone infected as best I can tell. She is previously taken Bactrim and that may be a good option for her today as well. We are gonna see what I wound culture shows as well and I think that this is definitely appropriate. With that being said outside of the culture I still need to initiate something in the interim and that's what I'm gonna go ahead and select Bactrim is a good option for her. 12/14/18 on evaluation today patient appears to be doing better in  regard to her left great toe ulcer as compared to last week's evaluation. There's still some erythema although this is significantly improved which is excellent news. Overall I do believe that she is making good progress is still gonna take some time before she is where I would like her to be from the standpoint of being able to place her back into the total contact cast. Hopefully we will be where we need to be by next week. 12/21/18 on evaluation today patient actually appears to be doing poorly in regard to her toe ulcer. She's been tolerating the dressing changes without complication. Fortunately there's no signs of systemic infection although she does have a lot of drainage from the toe ulcer and this does seem to be causing some issues at this point. She does have erythema on the distal portion of her toe that appears to be likely cellulitis. 12/28/18 on evaluation today patient actually appears to be doing a little better in my pinion in regard to her toe ulcer. With that being said she still does have some evidence of infection at this time and for her culture she had both E. coli as well as enterococcus as organisms noted on evaluation. For that reason I think that though the Keflex likely has treated the E. coli rather well this has really done nothing for the enterococcus. We are going to have to initiate treatment for this specifically. [name], [name] ([id number]) 01/04/19 on evaluation today patient's toe actually appears to be doing better from the standpoint of infection. She  currently would like to see about putting the cash back on I think that this is appropriate as long as she takes care of it and keeps it from getting wet. She is gonna have some drainage we can definitely pass this up with Drawtex and alginate to try to prevent as much drainage as possible from causing the problems. With that being said I do want to at least try her with the cast between now and Tuesday. If there any  issues we can't continue to use it then I will discontinue the use of the cast at that point. 01/08/19 on evaluation today patient actually appears to be doing very well as far as her foot ulcer specifically the great toe on the left is concerned. She did have an area of rubbing on the medial aspect of her left ankle which again is from the cast. Fortunately there's no signs of infection at this point in this appears to be a very slight skin breakdown. The patient tells me she felt it rubbing but didn't think it was that bad. Fortunately there is no signs of active infection at this time which is good news. No fevers, chills, nausea, or vomiting noted at this time. 01/15/19 on evaluation today patient actually appears to be doing well in regard to her toe ulcer. Again as previous she seems to do well and she has the cast on which indicates to me that during the time she doesn't have a cast on she's putting way too much pressure on this region. Obviously I think that's gonna be an issue as with the current national emergency concerning the Covid-19 Virus it has been recommended that we discontinue the use of total contact casting by the chief medical officer of our company, Dr. Simona Huh. The reasoning is that if a patient becomes sick and cannot come into have the cast removed they could not just leave this on for an additional two weeks. Obviously the hospitals also do not want to receive patient's who are sick into the emergency department to potentially contaminate the region and spread the Covid-19 Virus among other sick individuals within the hospital system. Therefore at this point we are suspending the use of total contact cast until the current emergency subsides. This was all discussed with the patient today as well. 01/22/19 on evaluation today patient's wound on her left great toe appears to be doing slightly worse than previously noted last week. She tells me that she has been on this quite a  bit in fact she tells me she's been awake for 38 straight hours. This is due to the fact that she's having to care for grandparents because nobody else will. She has been taking care of them for five the last seven days since I've seen her they both have dementia his is from a stroke and her grandmother's was progressive. Nonetheless she states even her mom who knows her condition and situation has only help two of those days to take care of them she's been taking care of the rest. Fortunately there does not appear to be any signs of active infection in regard to her toe at this point although obviously it doesn't look as good as it did previous. I think this is directly related to her not taking off the pressure and friction by way of taking things easy. Though I completely understand what's going on. 01/29/19 on evaluation today patient's tools are actually appears to be showing some signs of improvement today compared to  last week's evaluation as far as not necessarily the overall size of the wound but the fact that she has some new skin growth in between the two ends of the wound opening. Overall I feel like she has done well she states that she had a family member give her what sounds to be a CAM walker boot which has been helpful as well. 02/05/19 on evaluation today patient's wound bed actually appears to be doing significantly better in regard to her overall appearance of the size of the wound. With that being said she is still having an issue with offloading efficiently enough to get this to close. Apparently there is some signs of infection at this point as well unfortunately. Previously she's done well of Augmentin I really do not see anything that needs to be culture currently but there theme and cellulitis of the foot that I'm seeing I'm gonna go ahead and place her on an antibiotic today to try to help clear this up. 02/12/2019 on evaluation today patient actually appears to be doing poorly  in regard to her overall wound status. She tells me she has been using her offloading shoe but actually comes in today wearing her tennis shoe with the AFO brace. Again as I previously discussed with her this is really not sufficient to allow the area to heal appropriately. Nonetheless she continues to be somewhat noncompliant and I do wonder based on what she has told my nurse in the past as to whether or not a good portion of this noncompliance may be recreational drug and alcohol related. She has had a history of heroin overdose and this was fairly recently in the past couple of months that have been seeing her. Nonetheless overall I feel like her wound looks significantly worse today compared to what it was previous. She still has significant erythema despite the Augmentin I am not sure that this is an appropriate medication for her infection I am also concerned that the infection may have gone down into her bone. 02/19/19 on evaluation today patient actually appears to be doing about the same in regard to her toe ulcer. Unfortunately she continues to show signs of bone exposure and infection at this point. There does not appear to be any evidence of worsening of the infection but I'm also not really sure that it's getting significantly better. She is on the Augmentin which should be sufficient for the Staphylococcus aureus infection that she has at this point. With that being said she may need IV antibiotics to more appropriately treat this. We did have a discussion today about hyperbaric option therapy. 02/28/19 on evaluation today patient actually appears to be doing much worse in regard to the wound on her left great toe as compared to even my previous evaluation last week. Unfortunately this seems to be training in a pretty poor direction. Her toe was actually now starting to angle laterally and I can actually see the entire joint area of the proximal portion of the digit where is the distal  portion of the digit again is no longer even in contact with the joint line. Unfortunately there's a lot more necrotic tissue around the edge and the toe appears to be showing signs of becoming gangrenous in my pinion. I'm very concerned about were things stand at this point. She did see infectious disease and they are planning to send in a prescription for Sivextro for her and apparently this has been approved. With that being said I don't think  she should avoid taking this but at the same time I'm not sure that it's gonna be sufficient to save her toe at this point. 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 03/05/19 on evaluation today patient actually appears to be doing quite well in regard to her toe all things considering. She still has bone exposed but there appears to be much less your thing on overall the appearance of the wound and the toe itself is dramatically improved. She still does have some issues currently obviously with infection she did see vascular as well and there concerned that her blood flow to the toad. For that reason they are setting up for an angiogram next week. 03/14/19 on evaluation today patient appears to be doing very poor in regard to her toe and specifically in regard to the ulceration and the fact that she's starting to notice the toe was leaning even more towards the lateral aspect and the complete joint is visible on the proximal aspect of the joint. Nonetheless she's also noted a significant odor and the tip of the toe is turning more dark and necrotic appearing. Overall I think she is getting worse not better as far as this is concerned. For that reason I am recommending at this point that she likely needs to be seen for  likely amputation. READMISSION 03/19/2021 This is a patient that we cared for in this clinic for a prolonged period of time in 2019 and 2020 with a left foot and left first toe wound. 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 ([id number]) 04/09/20 by Dr. Luana Shu. In December 2021 she had an ulcer on her right great toe as well as the fourth and fifth toes. 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 She also had an angiogram at that time and underwent angioplasty of the right anterior tibial artery. In any case she claims that the wound on the right foot is closed I did not look at this today which was probably an oversight although I think that should be done next week. After her surgery she developed a dehiscence but I do not see any follow-up of this. According to Dr. Deborra Medina last review that she was out of the area being cared for by another physician but recently came back to his attention. 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 A culture of this area showed E. coli apparently before she came back to see Dr. Luana Shu she was supposed to be receiving antibiotics but she did not really take them. Nor is she offloading this area at all. Finally her last hemoglobin A1c listed in epic was in March 2022 at 14.1 she says things are a lot better since then although I am not sure. 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 She had this wound at the time. During this admission she had E. coli sepsis a TEE was negative. Past medical history is extensive and includes type 2 diabetes with peripheral neuropathy cardiomyopathy with an ejection fraction of 33%, hypertension, hyperlipidemia chronic renal failure stage III history of  substance abuse with cocaine  although she claims to be clean now verified by her mother. She is still a heavy cigarette smoker. She has a history of bipolar disorder seizure disorder ABI in our clinic was 1.05 6/1; left midfoot in the setting of a TMA done previously. Round circular wound with a "knuckle" of protruding tissue. The problem is that the knuckle was not attached to any of the surrounding granulation and this probed proximally widely I removed a large portion of this tissue. This wound goes with considerable undermining laterally. I do not feel any bone there was no purulence but this is a deep wound. 6/8; in spite of the debridement I did last week. She arrives with a wound looking exactly the same. A protruding "knuckle" of tissue nonadherent to most of the surrounding tissue. There is considerable depth around this from 6-12 o'clock at 2.7 cm and undermining of 1 cm. This does not look overtly infected and the x-ray I did last week was negative for any osseous abnormalities. We have been using silver collagen 6/15; deep tissue culture I did last week showed moderate staph aureus and moderate Pseudomonas. This will definitely require prolonged antibiotic therapy. The pathology on the protuberant area was negative for malignancy fungus etc. 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 We have been using silver collagen. I am going to be prescribing Levaquin for 2 weeks. Her CT scan of the foot is down for 7/5 6/22; CT scan of the foot on 7 5. She says she has hardware in the left leg from her previous fracture. She is on the Levaquin for the deep tissue culture I did that showed methicillin sensitive staph aureus and Pseudomonas. I gave her a 2-week supply and she will have another week. She arrives in clinic today with the same protuberant tissue however this is nonadherent to the tissue surrounding it. I am really at a loss to explain this unless there is  underlying deep tissue infection 6/29; patient presents for 1 week follow-up. She has been using collagen to the wound bed. She reports taking her antibiotics as prescribed.She has no complaints or issues today. She denies signs of infection. 7/6; patient presents for one week followup. She has been using collagen to the wound bed. She states she is taking Levaquin however at times she is not able to keep it down. She denies signs of infection. 7/13; patient presents for 1 week follow-up. She has been using silver alginate to the wound bed. She still has nausea when taking her antibiotics. She denies signs of infection. Electronic Signature(s) Signed: 05/07/2021 2:55:14 PM By: Kalman Shan DO Entered By: Kalman Shan on 05/05/2021 15:13:21 [name] ([id number]) -------------------------------------------------------------------------------- Physical Exam Details Patient Name: Heather Dear A. Date of Service: 05/05/2021 1:00 PM Medical Record Number: [id number] Patient Account Number: [id number] Date of Birth/Sex: 04-Mar-1976 (45 y.o. F) Treating RN: Cornell Barman Primary Care Provider: Raelene Bott Other Clinician: Referring Provider: Raelene Bott Treating Provider/Extender: Yaakov Guthrie in Treatment: 6 Constitutional . Psychiatric . Notes Left foot: Wound to the plantar aspect that that has some granulation tissue and nonviable tissue present. Most of the tissue is pale tissue. There is significant callus circumferentially. No surrounding signs of infection Electronic Signature(s) Signed: 05/07/2021 2:55:14 PM By: Kalman Shan DO Entered By: Kalman Shan on 05/05/2021 15:05:53 [name] ([id number]) -------------------------------------------------------------------------------- Physician Orders Details Patient Name: Heather Dear A. Date of Service: 05/05/2021 1:00 PM Medical Record Number:  [id number] Patient Account Number: [id number] Date  of Birth/Sex: 1976/04/28 (45 y.o. F) Treating RN: Donnamarie Poag Primary Care Provider: Raelene Bott Other Clinician: Referring Provider: Raelene Bott Treating Provider/Extender: Yaakov Guthrie in Treatment: 6 Verbal / Phone Orders: No Diagnosis Coding Follow-up Appointments o Return Appointment in 1 week. Bathing/ Shower/ Hygiene o Clean wound with Normal Saline or wound cleanser. Off-Loading Left Lower Extremity o Open toe surgical shoe with peg assist. o Turn and reposition every 2 hours Medications-Please add to medication list. o P.O. Antibiotics - continue antibiotic until finished drink plenty of fluids and eat with meds Wound Treatment Wound #7 - Foot Wound Laterality: Plantar, Left, Midline Cleanser: Soap and Water 3 x Per Week/30 Days Discharge Instructions: Gently cleanse wound with antibacterial soap, rinse and pat dry prior to dressing wounds Peri-Wound Care: Desitin Maximum Strength Ointment 4 (oz) 3 x Per Week/30 Days Discharge Instructions: peri wound Topical: Gentamicin 3 x Per Week/30 Days Discharge Instructions: Apply to wound bed and tunnel area under the dressing Primary Dressing: Silvercel Small 2x2 (in/in) 3 x Per Week/30 Days Discharge Instructions: pack into tunnel and touch wound bedApply Silvercel Small 2x2 (in/in) as instructed Secondary Dressing: ABD Pad 5x9 (in/in) 3 x Per Week/30 Days Discharge Instructions: Cover with ABD pad on top of dressing Secondary Dressing: Gauze 3 x Per Week/30 Days Discharge Instructions: to hold dressing in place Secured With: Kerlix Roll Sterile or Non-Sterile 6-ply 4.5x4 (yd/yd) 3 x Per Week/30 Days Discharge Instructions: Apply Kerlix as directed Electronic Signature(s) Signed: 05/06/2021 4:28:40 PM By: Donnamarie Poag Signed: 05/07/2021 2:55:14 PM By: Kalman Shan DO Entered By: Donnamarie Poag on 05/05/2021 13:55:40 [name], [name]  ([id number]) -------------------------------------------------------------------------------- Problem List Details Patient Name: Heather Dear A. Date of Service: 05/05/2021 1:00 PM Medical Record Number: [id number] Patient Account Number: [id number] Date of Birth/Sex: [birth year] (45 y.o. F) Treating RN: Cornell Barman Primary Care Provider: Raelene Bott Other Clinician: Referring Provider: Raelene Bott Treating Provider/Extender: Yaakov Guthrie in Treatment: 6 Active Problems ICD-10 Encounter Code Description Active Date MDM Diagnosis E11.621 Type 2 diabetes mellitus with foot ulcer 03/19/2021 No Yes 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 03/19/2021 No Yes severity E11.42 Type 2 diabetes mellitus with diabetic polyneuropathy 03/19/2021 No Yes Inactive Problems Resolved Problems Electronic Signature(s) Signed: 05/07/2021 2:55:14 PM By: Kalman Shan DO Entered By: Kalman Shan on 05/05/2021 15:03:12 [name], [name] ([id number]) -------------------------------------------------------------------------------- Progress Note Details Patient Name: Heather Dear A. Date of Service: 05/05/2021 1:00 PM Medical Record Number: [id number] Patient Account Number: [id number] Date of Birth/Sex: December 02, 1975 (45 y.o. F) Treating RN: Cornell Barman Primary Care Provider: Raelene Bott Other Clinician: Referring Provider: Raelene Bott Treating Provider/Extender: Yaakov Guthrie in Treatment: 6 Subjective Chief Complaint Information obtained from Patient Left great toe ulcer 03/19/2021; patient referred by Dr. Luana Shu who has been looking after her left foot for quite a period of time for review of a nonhealing area in the left midfoot History of Present Illness (HPI) 01/18/18-She is here for initial evaluation of the left great toe ulcer. She is a poor historian in regards to timeframe in detail. She states approximately 4 weeks ago she  lacerated her toe on something in the house. She followed up with her primary care who placed her on Bactrim and ultimately a second dose of Bactrim prior to coming to wound clinic. She states she has been treating the toe with peroxide, Betadine and a Band-Aid. She did not check her blood sugar this morning  but checked it yesterday morning it was 327; she is unaware of a recent A1c and there are no current records. She saw Dr. she would've orthopedics last week for an old injury to the left ankle, she states he did not see her toe, nor did she bring it to his attention. She smokes approximately 1 pack cigarettes a day. Her social situation is concerning, she arrives this morning with her mother who appears extremely intoxicated/under the influence; her mother was asked to leave the room and be monitored by the patient's grandmother. The patient's aunt then accompanied the patient and the room throughout the rest of the appointment. We had a lengthy discussion regarding the deleterious effects of uncontrolled hyperglycemia and smoking as it relates to wound healing and overall health. She was strongly encouraged to decrease her smoking and get her diabetes under better control. She states she is currently on a diet and has cut down her [facility] consumption. The left toe is erythematous, macerated and slightly edematous with malodor present. The edema in her left foot is below her baseline, there is no erythema streaking. We will treat her with Santyl, doxycycline; we have ordered and xray, culture and provided a Peg assist surgical shoe and cultured the wound. 01/25/18-She is here in follow-up evaluation for a left great toe ulcer and presents with an abscess to her suprapubic area. She states her blood sugars remain elevated, feeling "sick" and if levels are below 250, but she is trying. She has made no attempt to decrease her smoking stating that we "can't take away her food in her cigarettes". She  has been compliant with offloading using the PEG assist you. She is using Santyl daily. the culture obtained last week grew staph aureus and Enterococcus faecalis; continues on the doxycycline and Augmentin was added on Monday. The suprapubic area has erythema, no femoral variation, purple discoloration, minimal induration, was accessed with a cotton tip applicator with sanguinopurulent drainage, this was cultured, I suspect the current antibiotic treatment will cover and we will not add anything to her current treatment plan. She was advised to go to urgent care or ER with any change in redness, induration or fever. 02/01/18-She is here in follow-up evaluation for left great toe ulcers and a new abdominal abscess from last week. She was able to use packing until earlier this week, where she "forgot it was there". She states she was feeling ill with GI symptoms last week and was not taking her antibiotic. She states her glucose levels have been predominantly less than 200, with occasional levels between 200-250. She thinks this was contributing to her GI symptoms as they have resolved without intervention. There continues to be significant laceration to left toe, otherwise it clinically looks stable/improved. There is now less superficial opening to the lateral aspect of the great toe that was residual blister. We will transition to [facility] to all wounds, she will continue her Augmentin. If there is no change or deterioration next week for reculture. 02/08/18-She is here in follow-up evaluation for left great toe ulcer and abdominal ulcer. There is an improvement in both wounds. She has been wrapping her left toe with coban, not by our direction, which has created an area of discoloration to the medial aspect; she has been advised to NOT use coban secondary to her neuropathy. She states her glucose levels have been high over this last week ranging from 200-350, she continues to smoke. She admits  to being less compliant with her  offloading shoe. We will continue with same treatment plan and she will follow-up next week. 02/15/18-She is here in follow-up evaluation for left great toe ulcer and abdominal ulcer. The abdominal ulcer is epithelialized. The left great toe ulcer is improved and all injury from last week using the Coban wrap is resolved, the lateral ulcer is healed. She admits to noncompliance with wearing offloading shoe and admits to glucose levels being greater than 300 most of the week. She continues to smoke and expresses no desire to quit. There is one area medially that probes deeper than it has historically, erythema to the toe and dorsal foot has consistently waxed and waned. There is no overt signs of cellulitis or infection but we will culture the wound for any occult infection given the new area of depth and erythema. We will hold off on sensitivities for initiation of antibiotic therapy. 02/22/18-She is here in follow up evaluation for left great toe ulcer. There is overall significant improvement in both wound appearance, erythema and edema with changes made last week. She was not initiated on antibiotic therapy. Culture obtained last week showed oxacillin sensitive staph aureus, sensitive to clindamycin. Clindamycin has been called into the pharmacy but she has been instructed to hold off on initiation secondary to overall clinical improvement and her history of antibiotic intolerance. She has been instructed to contact the clinic with any noted changes/deterioration and the wound, erythema, edema and/or pain. She will follow-up next week. She continues to smoke and her glucose levels remain elevated >250; she admits to compliance with offloading shoe 03/01/18 on evaluation today patient appears to be doing fairly well in regard to her left first toe ulcer. She has been tolerating the dressing changes with the [facility] Dressing without complication and overall this has  definitely showed signs of improvement according to records as well is what the patient tells me today. I'm very pleased in that regard. She is having no pain today 03/08/18 She is here for follow up evaluation of a left great toe ulcer. She remains non-compliant with glucose control and smoking cessation; glucose levels consistently >200. She states that she got new shoe inserts/peg assist. She admits to compliance with offloading. Since my last evaluation there is significant improvement. We will switch to prisma at this time and she will follow up next week. She is noted to be tachycardic at this appointment, heart rate 120s; she has a history of heart rate 70-130 according to our records. She admits to extreme agitation r/t personal issues; she was advised to monitor her heartrate and contact her physician if it does not return to a more normal range (<100). She takes cardizem twice daily. 03/15/18-She is here in follow-up evaluation for left great toe ulcer. She remains noncompliant with glucose control and smoking cessation. She admits to compliance with wearing offloading shoe. The ulcer is improved/stable and we will continue with the same treatment plan and she will follow-up next week 03/22/18-She is here for evaluation for left great toe ulcer. There continues to be significant improvement despite recurrent hyperglycemia (over 500 yesterday) and she continues to smoke. She has been compliant with offloading and we will continue with same treatment plan and she will follow-up next week. Cannata, Melodie A. ([id number]) 03/29/18-She is here for evaluation for left great toe ulcer. Despite continuing to smoke and uncontrolled diabetes she continues to improve. She is compliant with offloading shoe. We will continue with the same treatment plan and she will follow-up next week 04/05/18-  She is here in follow up evaluation for a left great toe ulcer; she presents with small pustule to left fifth toe  (resembles ant bite). She admits to compliance with wearing offloading shoe; continues to smoke or have uncontrolled blood glucose control. There is more callus than usual with evidence of bleeding; she denies known trauma. 04/12/18-She is here for evaluation of left great toe ulcer. Despite noncompliance with glycemic control and smoking she continues to make improvement. She continues to wear offloading shoe. The pustule, that was identified last week, to the left fifth toe is resolved. She will follow-up in 2 weeks 05/03/18-she is seen in follow-up evaluation for a left great toe ulcer. She is compliant with offloading, otherwise noncompliant with glycemic control and smoking. She has plateaued and there is minimal improvement noted. We will transition to [facility], replaced the insert to her surgical shoe and she will follow-up in one week 05/10/18- She is here in follow up evaluation for a left great toe ulcer. It appears stable despite measurement change. We will continue with same treatment plan and follow up next week. 05/24/18-She is seen in follow-up evaluation for a left great toe ulcer. She remains compliant with offloading, has made significant improvement in her diet, decreasing the amount of sugar/soda. She said her recent A1c was 10.9 which is lower than. She did see a diabetic nutritionist/educator yesterday. She continues to smoke. We will continue with the same treatment plan and she'll follow-up next week. 05/31/18- She is seen in follow-up evaluation for left great toe ulcer. She continues to remain compliant with offloading, continues to make improvement in her diet, increasing her water and decreasing the amount of sugar/soda. She does continue to smoke with no desire to quit. We will apply Prisma to the depth and Hydrofera Blue over. We have not received insurance authorization for oasis. She will follow up next week. 06/07/18-She is seen in follow-up evaluation for left great  toe ulcer. It has stalled according to today's measurements although base appears stable. She says she saw a diabetic educator yesterday; her average blood sugars are less than 300 which is an improvement for her. She continues to smoke and states "that's my next step" She continues with water over soda. We will order for xray, culture and reinstate ace wrap compression prior to placing apligraf for next week. She is voicing no complaints or concerns. Her dressing will change to iodoflex over the next week in preparation for apligraf. 06/14/18-She is seen in follow-up evaluation for left great toe ulcer. Plain film x-ray performed last week was negative for osteomyelitis. Wound culture obtained last week grew strep B and OSSA; she is initiated on keflex and cefdinir today; there is erythema to the toe which could be from ace wrap compression, she has a history of wrapping too tight and has has been encouraged to maintain ace wraps that we place today. We will hold off on application of apligraf today, will apply next week after antibiotic therapy has been initiated. She admits today that she has resumed taking a shower with her foot/toe submerged in water, she has been reminded to keep foot/toe out of the bath water. She will be seen in follow up next week 06/21/18-she is seen in follow-up evaluation for left great toe ulcer. She is tolerating antibiotic therapy with no GI disturbance. The wound is stable. Apligraf was applied today. She has been decreasing her smoking, only had 4 cigarettes yesterday and 1 today. She continues being more compliant  in diabetic diet. She will follow-up next week for evaluation of site, if stable will remove at 2 weeks. 06/28/18- She is here in follow up evalution. Apligraf was placed last week, she states the dressing fell off on Tuesday and she was dressing with hydrofera blue. She is healed and will be discharged from the clinic today. She has been instructed to continue  with smoking cessation, continue monitoring glucose levels, offloading for an additional 4 weeks and continue with hydrofera blue for additional two weeks for any possible microscopic opening. Readmission: 08/07/18 on evaluation today patient presents for reevaluation concerning the ulcer of her right great toe. She was previously discharged on 06/28/18 healed. Nonetheless she states that this began to show signs of drainage she subsequently went to her primary care provider. Subsequently an x-ray was performed on 08/01/18 which was negative. The patient was also placed on antibiotics at that time. Fortunately they should have been effective for the infection. Nonetheless she's been experiencing some improvement but still has a lot of drainage coming from the wound itself. 08/14/18 on evaluation today patient's wound actually does show signs of improvement in regard to the erythema at this point. She has completed the antibiotics. With that being said we did discuss the possibility of placing her in a total contact cast as of today although I think that I may want to give this just a little bit more time to ensure nothing recurrence as far as her infection is concerned. I do not want to put in the cast and risk infection at that time if things are not completely resolved. With that being said she is gonna require some debridement today. 08/21/18 on evaluation today patient actually appears to be doing okay in regard to her toe ulcer. She's been tolerating the dressing changes without complication. With that being said it does appear that she is ready and in fact I think it's appropriate for [place] to go ahead and initiate the total contact cast today. Nonetheless she will require some sharp debridement to prepare the wound for application. Overall I feel like things have been progressing well but we do need to do something to get this to close more readily. 08/24/18 patient seen today for reevaluation after  having had the total contact cast applied on Tuesday. She seems to have done very well the wound appears to be doing great and overall I'm pleased with the progress that she's made. There were no abnormal areas of rubbing from the cast on her lower extremity. 08/30/18 on evaluation today patient actually appears to be completely healed in regard to her plantar toe ulcer. She tells me at this point she's been having a lot of issues with the cast. She almost fell a couple of times the state shall the step of her dog a couple times as well. This is been a very frustrating process for her other nonetheless she has completely healed the wound which is excellent news. Overall there does not appear to be the evidence of infection at this time which is great news. 09/11/18 evaluation today patient presents for follow-up concerning her great toe ulcer on the left which has unfortunately reopened since I last saw her which was only a couple of weeks ago. Unfortunately she was not able to get in to get the shoe and potentially the AFO that's gonna be necessary due to her left foot drop. She continues with offloading shoe but this is not enough to prevent her from reopening it appears.  When we last had her in the total contact cast she did well from a healing standpoint but unfortunately the wound reopened as soon as she came out of the cast within just a couple of weeks. 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 This is also part of the reason why she falls on a regular basis. I just do not believe that is gonna be safe for [place] to reinitiate the total contact cast as last time we had this on she fell 3 times one week which is definitely not normal for her. 09/18/18 upon evaluation today the patient actually appears to  be doing about the same in regard to her toe ulcer. She did not contact [employer] as I asked her to even though I had given her the prescription. In fact she actually states that she has no idea where the prescription is. She did apparently call [employer] and they told her that all she needed to do was bring the prescription in order to be able to be seen and work on getting the AFO for her left foot. With all that being said she still does not have an appointment and I'm not sure were things stand that regard. I will give her a new prescription today in order to contact them to get this set up. ADIYAH, WADLER A. ([id number]) 09/25/18 on evaluation today patient actually appears to be doing about the same in regard to her toes ulcer. She does have a small areas which seems to have a lot of callous buildup around the edge of the wound which is going to need sharp debridement today. She still is waiting to be scheduled for evaluation with [facility] for possibility of an AFO. She states there supposed to call her tomorrow to get this set up. Unfortunately it does appear that her foot specifically the toe area is showing signs of erythema. There does not appear to be any systemic infection which is in these good news. 10/02/18 on evaluation today patient actually appears to be doing about the same in regard to her toe ulcer. This really has not done too well although it's not significantly larger it's also not significantly smaller. She has been tolerating the dressing changes without complication. She actually has her appointment with [facility] and [place] tomorrow to hopefully be measured for obtaining and AFO splint. I think this would be helpful preventing this from reoccurring. We had contemplated starting the cast this week although to be honest I am reluctant to do that as she's been having nausea, vomiting, and seizure activity over the past three days. She has a history of seizures and have been told is  nothing that can be done for these. With that being said I do believe that along with the seizures have the nausea vomiting which upon further questioning doesn't seem to be the normal for her and makes me concerned for the possibility of infection or something else going on. I discussed this with the patient and her mother during the office visit today. I do not feel the wound is effective but maybe something else. The responses this was "this just happens to her at times and we don't know why". They did not seem to be interested in going to the hospital to have this  checked out further. 10/09/18 on evaluation today patient presents for follow-up concerning her ongoing toe ulcer. She has been tolerating the dressing changes without complication. Fortunately there does not appear to be any evidence of infection which is great news however I do think that the patient would benefit from going ahead for with the total contact cast. She's actually in a wheelchair today she tells me that she will use her walker if we initiate the cast. I was very specific about the fact that if we were gonna do the cast I wanted to make sure that she was using the walker in order to prevent any falls. She tells me she does not have stairs that she has to traverse on a regular basis at her home. She has not had any seizures since last week again that something that happens to her often she tells me she did talk to [employer] and they said that it may take up to three weeks to get the brace approved for her. Hopefully that will not take that long but nonetheless in the meantime I do think the cast could be of benefit. 10/12/18 on evaluation today patient appears to be doing rather well in regard to her toe ulcer. It's just been a few days and already this is significantly improved both as far as overall appearance and size. Fortunately there's no sign of infection. She is here for her first obligatory cast change. 10/19/18  Seen today for follow up and management of left great toe ulcer. Wound continues to show improvement. Noted small open area with seroussang drainage with palpation. Denies any increased pain or recent fevers during visit. She will continue calcium alginate with offloading shoe. Denies any questions or concerns during visit. 10/26/18 on evaluation today patient appears to be doing about the same as when I last saw her in regard to her wound bed. Fortunately there does not appear to be any signs of infection. Unfortunately she continues to have a breakdown in regard to the toe region any time that she is not in the cast. It takes almost no time at all for this to happen. Nonetheless she still has not heard anything from the brace being made by [employer] as to when exactly this will be available to her. Fortunately there is no signs of infection at this time. 10/30/18 on evaluation today patient presents for application of the total contact cast as we just received him this morning. Fortunately we are gonna be able to apply this to her today which is great news. She continues to have no significant pain which is good news. Overall I do feel like things have been improving while she was the cast is when she doesn't have a cast that things get worse. She still has not really heard anything from [place] regarding her brace. 11/02/18 upon evaluation today patient's wound already appears to be doing significantly better which is good news. Fortunately there does not appear to be any signs of infection also good news. Overall I do think the total contact cast as before is helping to heal this area unfortunately it's just not gonna likely keep the area closed and healed without her getting her brace at least. Again the foot drop is a significant issue for her. 11/09/18 on evaluation today patient appears to be doing excellent in regard to her toe ulcer which in fact is completely healed. Fortunately we finally got the  situation squared away with the paperwork which was needed to proceed with  getting her brace approved by Medicaid. I have filled that out unfortunately that information has been sent to the orthopedic office that I worked at 2 1/2 years ago and not tired Current wound care measures. Fortunately she seems to be doing very well at this time. 11/23/18 on evaluation today patient appears to be doing More Poorly Compared to Last Time I Saw Her. At [facility] She Had Completely Healed. Currently she is continuing to have issues with reopening. She states that she just found out that the brace was approved through Medicaid now she just has to go get measured in order to have this fitted for her and then made. Subsequently she does not have an appointment for this yet that is going to complicate things we obviously cannot put her back in the cast if we do not have everything measured because they're not gonna be able to measure her foot while she is in the cast. 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 Obviously this is unfortunate and does have me somewhat worried as well. 11/30/18 on evaluation today patient's toe ulcer actually appears to be doing fairly well. The good news is she will be getting her brace in the shoes next week on Wednesday. Hopefully we will be able to get this to heal without having to go back in the cast however she may need the cast in order to get the wound completely heal and then go from there. Fortunately there's no signs of infection at this time. 12/07/18 on evaluation today patient fortunately did receive her brace and she states she could tell this definitely makes her walk better. With that being said she's been having issues with her toe where she noticed yesterday there was a lot of tissue that was loosing off this appears to be much larger than what it was previous. She also  states that her leg has been read putting much across the top of her foot just about the ankle although this seems to be receiving somewhat. The total area is still red and appears to be someone infected as best I can tell. She is previously taken Bactrim and that may be a good option for her today as well. We are gonna see what I wound culture shows as well and I think that this is definitely appropriate. With that being said outside of the culture I still need to initiate something in the interim and that's what I'm gonna go ahead and select Bactrim is a good option for her. 12/14/18 on evaluation today patient appears to be doing better in regard to her left great toe ulcer as compared to last week's evaluation. There's still some erythema although this is significantly improved which is excellent news. Overall I do believe that she is making good progress is still gonna take some time before she is where I would like her to be from the standpoint of being able to place her back into the total contact cast. Hopefully we will be where we need to be by next week. 12/21/18 on evaluation today patient actually appears to be doing poorly in regard to her toe ulcer. She's been tolerating the dressing changes without complication. Fortunately there's no signs of systemic infection although she does have a lot of drainage from the toe ulcer and this does Metivier, Sujey A. ([id number]) seem to be causing some issues at this  point. She does have erythema on the distal portion of her toe that appears to be likely cellulitis. 12/28/18 on evaluation today patient actually appears to be doing a little better in my pinion in regard to her toe ulcer. With that being said she still does have some evidence of infection at this time and for her culture she had both E. coli as well as enterococcus as organisms noted on evaluation. For that reason I think that though the Keflex likely has treated the E. coli rather well  this has really done nothing for the enterococcus. We are going to have to initiate treatment for this specifically. 01/04/19 on evaluation today patient's toe actually appears to be doing better from the standpoint of infection. She currently would like to see about putting the cash back on I think that this is appropriate as long as she takes care of it and keeps it from getting wet. She is gonna have some drainage we can definitely pass this up with Drawtex and alginate to try to prevent as much drainage as possible from causing the problems. With that being said I do want to at least try her with the cast between now and Tuesday. If there any issues we can't continue to use it then I will discontinue the use of the cast at that point. 01/08/19 on evaluation today patient actually appears to be doing very well as far as her foot ulcer specifically the great toe on the left is concerned. She did have an area of rubbing on the medial aspect of her left ankle which again is from the cast. Fortunately there's no signs of infection at this point in this appears to be a very slight skin breakdown. The patient tells me she felt it rubbing but didn't think it was that bad. Fortunately there is no signs of active infection at this time which is good news. No fevers, chills, nausea, or vomiting noted at this time. 01/15/19 on evaluation today patient actually appears to be doing well in regard to her toe ulcer. Again as previous she seems to do well and she has the cast on which indicates to me that during the time she doesn't have a cast on she's putting way too much pressure on this region. Obviously I think that's gonna be an issue as with the current national emergency concerning the Covid-19 Virus it has been recommended that we discontinue the use of total contact casting by the chief medical officer of our company, Dr. Simona Huh. The reasoning is that if a patient becomes sick and cannot come into have the  cast removed they could not just leave this on for an additional two weeks. Obviously the hospitals also do not want to receive patient's who are sick into the emergency department to potentially contaminate the region and spread the Covid-19 Virus among other sick individuals within the hospital system. Therefore at this point we are suspending the use of total contact cast until the current emergency subsides. This was all discussed with the patient today as well. 01/22/19 on evaluation today patient's wound on her left great toe appears to be doing slightly worse than previously noted last week. She tells me that she has been on this quite a bit in fact she tells me she's been awake for 38 straight hours. This is due to the fact that she's having to care for grandparents because nobody else will. She has been taking care of them for five the last seven days  since I've seen her they both have dementia his is from a stroke and her grandmother's was progressive. Nonetheless she states even her mom who knows her condition and situation has only help two of those days to take care of them she's been taking care of the rest. Fortunately there does not appear to be any signs of active infection in regard to her toe at this point although obviously it doesn't look as good as it did previous. I think this is directly related to her not taking off the pressure and friction by way of taking things easy. Though I completely understand what's going on. 01/29/19 on evaluation today patient's tools are actually appears to be showing some signs of improvement today compared to last week's evaluation as far as not necessarily the overall size of the wound but the fact that she has some new skin growth in between the two ends of the wound opening. Overall I feel like she has done well she states that she had a family member give her what sounds to be a CAM walker boot which has been helpful as well. 02/05/19 on  evaluation today patient's wound bed actually appears to be doing significantly better in regard to her overall appearance of the size of the wound. With that being said she is still having an issue with offloading efficiently enough to get this to close. Apparently there is some signs of infection at this point as well unfortunately. Previously she's done well of Augmentin I really do not see anything that needs to be culture currently but there theme and cellulitis of the foot that I'm seeing I'm gonna go ahead and place her on an antibiotic today to try to help clear this up. 02/12/2019 on evaluation today patient actually appears to be doing poorly in regard to her overall wound status. She tells me she has been using her offloading shoe but actually comes in today wearing her tennis shoe with the AFO brace. Again as I previously discussed with her this is really not sufficient to allow the area to heal appropriately. Nonetheless she continues to be somewhat noncompliant and I do wonder based on what she has told my nurse in the past as to whether or not a good portion of this noncompliance may be recreational drug and alcohol related. She has had a history of heroin overdose and this was fairly recently in the past couple of months that have been seeing her. Nonetheless overall I feel like her wound looks significantly worse today compared to what it was previous. She still has significant erythema despite the Augmentin I am not sure that this is an appropriate medication for her infection I am also concerned that the infection may have gone down into her bone. 02/19/19 on evaluation today patient actually appears to be doing about the same in regard to her toe ulcer. Unfortunately she continues to show signs of bone exposure and infection at this point. There does not appear to be any evidence of worsening of the infection but I'm also not really sure that it's getting significantly better. She is  on the Augmentin which should be sufficient for the Staphylococcus aureus infection that she has at this point. With that being said she may need IV antibiotics to more appropriately treat this. We did have a discussion today about hyperbaric option therapy. 02/28/19 on evaluation today patient actually appears to be doing much worse in regard to the wound on her left great toe as compared  to even my previous evaluation last week. Unfortunately this seems to be training in a pretty poor direction. Her toe was actually now starting to angle laterally and I can actually see the entire joint area of the proximal portion of the digit where is the distal portion of the digit again is no longer even in contact with the joint line. Unfortunately there's a lot more necrotic tissue around the edge and the toe appears to be showing signs of becoming gangrenous in my pinion. I'm very concerned about were things stand at this point. She did see infectious disease and they are planning to send in a prescription for Sivextro for her and apparently this has been approved. With that being said I don't think she should avoid taking this but at the same time I'm not sure that it's gonna be sufficient to save her toe at this point. 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 03/05/19 on evaluation today patient actually appears to be doing quite well in regard to her toe all things considering. She still has bone exposed but there appears to be much less your thing on overall the appearance of the wound and the toe itself is dramatically improved. She still does have some issues currently obviously with infection she did see vascular as well and there concerned that her blood flow to the toad. For that reason they are  setting up for an angiogram next week. 03/14/19 on evaluation today patient appears to be doing very poor in regard to her toe and specifically in regard to the ulceration and the fact that she's starting to notice the toe was leaning even more towards the lateral aspect and the complete joint is visible on the proximal aspect of the joint. Nonetheless she's also noted a significant odor and the tip of the toe is turning more dark and necrotic appearing. Overall I think she is getting worse not better as far as this is concerned. For that reason I am recommending at this point that she likely needs to be seen for likely amputation. WILLODENE, EREKSON ([id number]) READMISSION 03/19/2021 This is a patient that we cared for in this clinic for a prolonged period of time in 2019 and 2020 with a left foot and left first toe wound. 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 Luana Shu. In December 2021 she had an ulcer on her right great toe as well as the fourth and fifth toes. 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 She also had an angiogram at that time and underwent angioplasty of the right anterior tibial artery. In any case she claims that the wound on the right foot is closed I did not look at this today which was probably an oversight although I think that should be done next week. After her surgery she developed a dehiscence but I do not see any follow-up of this. According to Dr. Deborra Medina last review that she was out of the area being cared for by another physician but recently came back to his attention. 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 A culture of this area  showed E. coli apparently before she came back to see Dr. Luana Shu she was supposed to be receiving antibiotics but she did not really take them. Nor is she offloading this  area at all. Finally her last hemoglobin A1c listed in epic was in March 2022 at 14.1 she says things are a lot better since then although I am not sure. 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 She had this wound at the time. During this admission she had E. coli sepsis a TEE was negative. 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 She is still a heavy cigarette smoker. She has a history of bipolar disorder seizure disorder ABI in our clinic was 1.05 6/1; left midfoot in the setting of a TMA done previously. Round circular wound with a "knuckle" of protruding tissue. The problem is that the knuckle was not attached to any of the surrounding granulation and this probed proximally widely I removed a large portion of this tissue. This wound goes with considerable undermining laterally. I do not feel any bone there was no purulence but this is a deep wound. 6/8; in spite of the debridement I did last week. She arrives with a wound looking exactly the same. A protruding "knuckle" of tissue nonadherent to most of the surrounding tissue. There is considerable depth around this from 6-12 o'clock at 2.7 cm and undermining of 1 cm. This does not look overtly infected and the x-ray I did last week was negative for any osseous abnormalities. We have been using silver collagen 6/15; deep tissue culture I did last week showed moderate staph aureus and moderate Pseudomonas. This will definitely require prolonged antibiotic therapy. The pathology on the protuberant area was negative for malignancy fungus etc. the comment was chronic ulceration with exuberant fibrin necrotic  debris and negative for malignancy. We have been using silver collagen. I am going to be prescribing Levaquin for 2 weeks. Her CT scan of the foot is down for 7/5 6/22; CT scan of the foot on 7 5. She says she has hardware in the left leg from her previous fracture. She is on the Levaquin for the deep tissue culture I did that showed methicillin sensitive staph aureus and Pseudomonas. I gave her a 2-week supply and she will have another week. She arrives in clinic today with the same protuberant tissue however this is nonadherent to the tissue surrounding it. I am really at a loss to explain this unless there is underlying deep tissue infection 6/29; patient presents for 1 week follow-up. She has been using collagen to the wound bed. She reports taking her antibiotics as prescribed.She has no complaints or issues today. She denies signs of infection. 7/6; patient presents for one week followup. She has been using collagen to the wound bed. She states she is taking Levaquin however at times she is not able to keep it down. She denies signs of infection. 7/13; patient presents for 1 week follow-up. She has been using silver alginate to the wound bed. She still has nausea when taking her antibiotics. She denies signs of infection. Patient History Information obtained from Patient. Social History Current every day smoker, Marital Status - Single, Alcohol Use - Never, Drug Use - Prior History,  Caffeine Use - Daily. Medical History Ear/Nose/Mouth/Throat Patient has history of Chronic sinus problems/congestion, Middle ear problems Hematologic/Lymphatic Patient has history of Anemia Respiratory Patient has history of Chronic Obstructive Pulmonary Disease (COPD) Cardiovascular Patient has history of Congestive Heart Failure - EF 33% Endocrine Patient has history of Type II Diabetes Genitourinary Patient has history of End Stage Renal Disease Integumentary (Skin) Patient has history of History of  pressure wounds Neurologic Patient has history of Neuropathy - feel and legs Pirani, [name] ([id number]) Objective Constitutional Vitals Time Taken: 1:10 PM, Height: 69 in, Weight: 185 lbs, BMI: 27.3, Temperature: 98.9 F, Pulse: 83 bpm, Respiratory Rate: 16 breaths/min, Blood Pressure: 95/65 mmHg. General Notes: stated she only had several sips of mt dew today. gave water to drink General Notes: Left foot: Wound to the plantar aspect that that has some granulation tissue and nonviable tissue present. Most of the tissue is pale tissue. There is significant callus circumferentially. No surrounding signs of infection Integumentary (Hair, Skin) Wound #7 status is Open. Original cause of wound was Gradually Appeared. The date acquired was: 08/07/2020. The wound has been in treatment 6 weeks. The wound is located on the [place]. The wound measures 1.7cm length x 1.8cm width x 0.9cm depth; 2.403cm^2 area and 2.163cm^3 volume. There is Fat Layer (Subcutaneous Tissue) exposed. Tunneling has been noted at 10:00 with a maximum distance of 1.5cm. Undermining begins at 12:00 and ends at 12:00 with a maximum distance of 0.4cm. There is a medium amount of serosanguineous drainage noted. The wound margin is epibole. There is medium (34-66%) pink granulation within the wound bed. There is a medium (34-66%) amount of necrotic tissue within the wound bed including Adherent Slough. Assessment Active Problems ICD-10 Type 2 diabetes mellitus with foot ulcer 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ity Type 2 diabetes mellitus with diabetic polyneuropathy Patient's wound is stable. I debrided nonviable tissue. I recommended continuing silver alginate. I reviewed her wound culture done by a different provider on 6/8. She is on Levaquin however its been a month since she started this medication. I am not sure if she ever took it. She is sensitive to  gentamicin. We will add topical gentamicin to her dressing and see if this shows any improvement. There are no obvious signs of infection on exam. I would also like to see if we can obtain a skin substitute. We will run this through her insurance. Procedures Wound #7 Pre-procedure diagnosis of Wound #7 is a Pressure Ulcer located on the Left,Midline,Plantar Foot . There was a Excisional Skin/Subcutaneous Tissue Debridement with a total area of 3.06 sq cm performed by Kalman Shan, MD. With the following instrument(s): Curette to remove Viable and Non-Viable tissue/material. Material removed includes Callus, Subcutaneous Tissue, Slough, and Biofilm. A time out was conducted at 13:49, prior to the start of the procedure. A Minimum amount of bleeding was controlled with Pressure. The procedure was tolerated well. Post Debridement Measurements: 1.7cm length x 1.9cm width x 0.9cm depth; 2.283cm^3 volume. Post debridement Stage noted as Category/Stage II. Character of Wound/Ulcer Post Debridement is improved. Post procedure Diagnosis Wound #7: Same as Pre-Procedure Plan Follow-up Appointments: Return Appointment in 1 week. Bathing/ Shower/ Hygiene: Clean wound with Normal Saline or wound cleanser. Off-Loading: Open toe surgical shoe with peg assist. Turn and reposition every 2 hours Duskin, [name] ([id number]) Medications-Please add to medication list.: P.O. Antibiotics - continue antibiotic until finished drink plenty of fluids and eat with meds WOUND #  7: - Foot Wound Laterality: Plantar, Left, Midline Cleanser: Soap and Water 3 x Per Week/30 Days Discharge Instructions: Gently cleanse wound with antibacterial soap, rinse and pat dry prior to dressing wounds Peri-Wound Care: Desitin Maximum Strength Ointment 4 (oz) 3 x Per Week/30 Days Discharge Instructions: peri wound Topical: Gentamicin 3 x Per Week/30 Days Discharge Instructions: Apply to wound bed and tunnel area under the  dressing Primary Dressing: Silvercel Small 2x2 (in/in) 3 x Per Week/30 Days Discharge Instructions: pack into tunnel and touch wound bedApply Silvercel Small 2x2 (in/in) as instructed Secondary Dressing: ABD Pad 5x9 (in/in) 3 x Per Week/30 Days Discharge Instructions: Cover with ABD pad on top of dressing Secondary Dressing: Gauze 3 x Per Week/30 Days Discharge Instructions: to hold dressing in place Secured With: Kerlix Roll Sterile or Non-Sterile 6-ply 4.5x4 (yd/yd) 3 x Per Week/30 Days Discharge Instructions: Apply Kerlix as directed 1. In office sharp debridement 2. Continue silver alginate and add gentamicin 3. Follow-up in 1 week Electronic Signature(s) Signed: 05/07/2021 2:55:14 PM By: Kalman Shan DO Entered By: Kalman Shan on 05/05/2021 15:13:37 [name] ([id number]) -------------------------------------------------------------------------------- ROS/PFSH Details Patient Name: Heather Dear A. Date of Service: 05/05/2021 1:00 PM Medical Record Number: [id number] Patient Account Number: [id number] Date of Birth/Sex: Jan 23, 1976 (45 y.o. F) Treating RN: Cornell Barman Primary Care Provider: Raelene Bott Other Clinician: Referring Provider: Raelene Bott Treating Provider/Extender: Yaakov Guthrie in Treatment: 6 Information Obtained From Patient Ear/Nose/Mouth/Throat Medical History: Positive for: Chronic sinus problems/congestion; Middle ear problems Hematologic/Lymphatic Medical History: Positive for: Anemia Respiratory Medical History: Positive for: Chronic Obstructive Pulmonary Disease (COPD) Cardiovascular Medical History: Positive for: Congestive Heart Failure - EF 33% Endocrine Medical History: Positive for: Type II Diabetes Time with diabetes: 14 years Treated with: Insulin, Oral agents Blood sugar tested every day: No Blood sugar testing results: Bedtime: 176 Genitourinary Medical History: Positive for: End Stage Renal  Disease Integumentary (Skin) Medical History: Positive for: History of pressure wounds Neurologic Medical History: Positive for: Neuropathy - feel and legs HBO Extended History Items Ear/Nose/Mouth/Throat: Ear/Nose/Mouth/Throat: Chronic sinus problems/congestion Middle ear problems Immunizations Pneumococcal Vaccine: Received Pneumococcal Vaccination: No Maclaughlin, [name] ([id number]) Implantable Devices No devices added Family and Social History Current every day smoker; Marital Status - Single; Alcohol Use: Never; Drug Use: Prior History; Caffeine Use: Daily Electronic Signature(s) Signed: 05/06/2021 4:19:16 PM By: Gretta Cool, BSN, RN, CWS, [name] RN, BSN Signed: 05/07/2021 2:55:14 PM By: Kalman Shan DO Entered By: Kalman Shan on 05/05/2021 15:05:13 [name], [name] ([id number]) -------------------------------------------------------------------------------- SuperBill Details Patient Name: Heather Dear A. Date of Service: 05/05/2021 Medical Record Number: [id number] Patient Account Number: [id number] Date of Birth/Sex: [birth year] (45 y.o. F) Treating RN: Donnamarie Poag Primary Care Provider: Raelene Bott Other Clinician: Referring Provider: Raelene Bott Treating Provider/Extender: Yaakov Guthrie in Treatment: 6 Diagnosis Coding ICD-10 Codes Code Description E11.621 Type 2 diabetes mellitus with foot ulcer 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ity E11.42 Type 2 diabetes mellitus with diabetic polyneuropathy Facility Procedures CPT4 Code: [id number] Description: [id number] - DEB SUBQ TISSUE 20 SQ CM/< Modifier: Quantity: 1 CPT4 Code: Description: ICD-10 Diagnosis Description 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 Modifier: severity Quantity: Physician Procedures CPT4 [id number] Description: 11042 - WC PHYS SUBQ TISS 20 SQ CM Modifier: Quantity: 1 CPT4  Code: Description: ICD-10 Diagnosis Description 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 Modifier: severity Quantity: Electronic Signature(s) Signed: 05/07/2021 2:55:14 PM  By: Kalman Shan DO Entered By: Kalman Shan on 05/05/2021 15:14:38

## 2021-05-10 NOTE — Progress Notes (Signed)
Heather, [name] ([id number]) Visit Report for 04/21/2021 Chief Complaint Document Details Patient Name: [name], [name] Date of Service: 04/21/2021 12:45 PM Medical Record Number: [id number] Patient Account Number: [id number] Date of Birth/Sex: [birth year] (45 y.o. F) Treating RN: Cornell Barman Primary Care Provider: Raelene Bott Other Clinician: Referring Provider: Raelene Bott Treating Provider/Extender: Yaakov Guthrie in Treatment: 4 Information Obtained from: Patient Chief Complaint Left great toe ulcer 03/19/2021; patient referred by Dr. Luana Shu who has been looking after her left foot for quite a period of time for review of a nonhealing area in the left midfoot Electronic Signature(s) Signed: 04/21/2021 1:46:11 PM By: Kalman Shan DO Entered By: Kalman Shan on 04/21/2021 13:36:37 Horstman, Marlana A. ([id number]) -------------------------------------------------------------------------------- Debridement Details Patient Name: Heather Dear A. Date of Service: 04/21/2021 12:45 PM Medical Record Number: [id number] Patient Account Number: [id number] Date of Birth/Sex: [birth year] (45 y.o. F) Treating RN: Cornell Barman Primary Care Provider: Raelene Bott Other Clinician: Referring Provider: Raelene Bott Treating Provider/Extender: Yaakov Guthrie in Treatment: 4 Debridement Performed for Wound #7 Left,Midline,Plantar Foot Assessment: Performed By: Physician Kalman Shan, MD Debridement Type: Debridement Level of Consciousness (Pre- Awake and Alert procedure): Pre-procedure Verification/Time Out Yes - 13:30 Taken: Total Area Debrided (L x W): 2 (cm) x 1.9 (cm) = 3.8 (cm) Tissue and other material Viable, Non-Viable, Callus, Subcutaneous debrided: Level: Skin/Subcutaneous Tissue Debridement Description: Excisional Instrument: Curette Bleeding: Minimum Hemostasis Achieved: Pressure Response to Treatment: Procedure was tolerated well Level of  Consciousness (Post- Awake and Alert procedure): Post Debridement Measurements of Total Wound Length: (cm) 2 Stage: Category/Stage II Width: (cm) 1.9 Depth: (cm) 1.2 Volume: (cm) 3.581 Character of Wound/Ulcer Post Debridement: Stable Post Procedure Diagnosis Same as Pre-procedure Electronic Signature(s) Signed: 04/21/2021 1:46:11 PM By: Kalman Shan DO Signed: 05/10/2021 1:36:15 PM By: Gretta Cool, BSN, RN, CWS, [name] RN, BSN Entered By: Gretta Cool, BSN, RN, CWS, [name] on 04/21/2021 13:32:07 [name] ([id number]) -------------------------------------------------------------------------------- HPI Details Patient Name: Heather Dear A. Date of Service: 04/21/2021 12:45 PM Medical Record Number: [id number] Patient Account Number: [id number] Date of Birth/Sex: May 29, 1976 (45 y.o. F) Treating RN: Cornell Barman Primary Care Provider: Raelene Bott Other Clinician: Referring Provider: Raelene Bott Treating Provider/Extender: Yaakov Guthrie in Treatment: 4 History of Present Illness HPI Description: 01/18/18-She is here for initial evaluation of the left great toe ulcer. She is a poor historian in regards to timeframe in detail. She states approximately 4 weeks ago she lacerated her toe on something in the house. She followed up with her primary care who placed her on Bactrim and ultimately a second dose of Bactrim prior to coming to wound clinic. She states she has been treating the toe with peroxide, Betadine and a Band-Aid. She did not check her blood sugar this morning but checked it yesterday morning it was 327; she is unaware of a recent A1c and there are no current records. She saw Dr. she would've orthopedics last week for an old injury to the left ankle, she states he did not see her toe, nor did she bring it to his attention. She smokes approximately 1 pack cigarettes a day. Her social situation is concerning, she arrives this morning with her mother who appears extremely  intoxicated/under the influence; her mother was asked to leave the room and be monitored by the patient's grandmother. The patient's aunt then accompanied the patient and the room throughout the rest of the appointment. We had a lengthy discussion regarding the deleterious effects of uncontrolled hyperglycemia and smoking as it relates to  wound healing and overall health. She was strongly encouraged to decrease her smoking and get her diabetes under better control. She states she is currently on a diet and has cut down her [facility] consumption. The left toe is erythematous, macerated and slightly edematous with malodor present. The edema in her left foot is below her baseline, there is no erythema streaking. We will treat her with Santyl, doxycycline; we have ordered and xray, culture and provided a Peg assist surgical shoe and cultured the wound. 01/25/18-She is here in follow-up evaluation for a left great toe ulcer and presents with an abscess to her suprapubic area. She states her blood sugars remain elevated, feeling "sick" and if levels are below 250, but she is trying. She has made no attempt to decrease her smoking stating that we "can't take away her food in her cigarettes". She has been compliant with offloading using the PEG assist you. She is using Santyl daily. the culture obtained last week grew staph aureus and Enterococcus faecalis; continues on the doxycycline and Augmentin was added on Monday. The suprapubic area has erythema, no femoral variation, purple discoloration, minimal induration, was accessed with a cotton tip applicator with sanguinopurulent drainage, this was cultured, I suspect the current antibiotic treatment will cover and we will not add anything to her current treatment plan. She was advised to go to urgent care or ER with any change in redness, induration or fever. 02/01/18-She is here in follow-up evaluation for left great toe ulcers and a new abdominal abscess  from last week. She was able to use packing until earlier this week, where she "forgot it was there". She states she was feeling ill with GI symptoms last week and was not taking her antibiotic. She states her glucose levels have been predominantly less than 200, with occasional levels between 200-250. She thinks this was contributing to her GI symptoms as they have resolved without intervention. There continues to be significant laceration to left toe, otherwise it clinically looks stable/improved. There is now less superficial opening to the lateral aspect of the great toe that was residual blister. We will transition to [facility] to all wounds, she will continue her Augmentin. If there is no change or deterioration next week for reculture. 02/08/18-She is here in follow-up evaluation for left great toe ulcer and abdominal ulcer. There is an improvement in both wounds. She has been wrapping her left toe with coban, not by our direction, which has created an area of discoloration to the medial aspect; she has been advised to NOT use coban secondary to her neuropathy. She states her glucose levels have been high over this last week ranging from 200-350, she continues to smoke. She admits to being less compliant with her offloading shoe. We will continue with same treatment plan and she will follow-up next week. 02/15/18-She is here in follow-up evaluation for left great toe ulcer and abdominal ulcer. The abdominal ulcer is epithelialized. The left great toe ulcer is improved and all injury from last week using the Coban wrap is resolved, the lateral ulcer is healed. She admits to noncompliance with wearing offloading shoe and admits to glucose levels being greater than 300 most of the week. She continues to smoke and expresses no desire to quit. There is one area medially that probes deeper than it has historically, erythema to the toe and dorsal foot has consistently waxed and waned. There is no  overt signs of cellulitis or infection but we will culture the wound  for any occult infection given the new area of depth and erythema. We will hold off on sensitivities for initiation of antibiotic therapy. 02/22/18-She is here in follow up evaluation for left great toe ulcer. There is overall significant improvement in both wound appearance, erythema and edema with changes made last week. She was not initiated on antibiotic therapy. Culture obtained last week showed oxacillin sensitive staph aureus, sensitive to clindamycin. Clindamycin has been called into the pharmacy but she has been instructed to hold off on initiation secondary to overall clinical improvement and her history of antibiotic intolerance. She has been instructed to contact the clinic with any noted changes/deterioration and the wound, erythema, edema and/or pain. She will follow-up next week. She continues to smoke and her glucose levels remain elevated >250; she admits to compliance with offloading shoe 03/01/18 on evaluation today patient appears to be doing fairly well in regard to her left first toe ulcer. She has been tolerating the dressing changes with the [facility] Dressing without complication and overall this has definitely showed signs of improvement according to records as well is what the patient tells me today. I'm very pleased in that regard. She is having no pain today 03/08/18 She is here for follow up evaluation of a left great toe ulcer. She remains non-compliant with glucose control and smoking cessation; glucose levels consistently >200. She states that she got new shoe inserts/peg assist. She admits to compliance with offloading. Since my last evaluation there is significant improvement. We will switch to prisma at this time and she will follow up next week. She is noted to be tachycardic at this appointment, heart rate 120s; she has a history of heart rate 70-130 according to our records. She admits to extreme  agitation r/t personal issues; she was advised to monitor her heartrate and contact her physician if it does not return to a more normal range (<100). She takes cardizem twice daily. 03/15/18-She is here in follow-up evaluation for left great toe ulcer. She remains noncompliant with glucose control and smoking cessation. She admits to compliance with wearing offloading shoe. The ulcer is improved/stable and we will continue with the same treatment plan and she will follow-up next week 03/22/18-She is here for evaluation for left great toe ulcer. There continues to be significant improvement despite recurrent hyperglycemia (over 500 yesterday) and she continues to smoke. She has been compliant with offloading and we will continue with same treatment plan and she will follow-up next week. 03/29/18-She is here for evaluation for left great toe ulcer. Despite continuing to smoke and uncontrolled diabetes she continues to improve. She is compliant with offloading shoe. We will continue with the same treatment plan and she will follow-up next week 04/05/18- She is here in follow up evaluation for a left great toe ulcer; she presents with small pustule to left fifth toe (resembles ant bite). She admits to compliance with wearing offloading shoe; continues to smoke or have uncontrolled blood glucose control. There is more callus than usual with evidence of bleeding; she denies known trauma. 04/12/18-She is here for evaluation of left great toe ulcer. Despite noncompliance with glycemic control and smoking she continues to make Dacanay, [name] A. ([id number]) improvement. She continues to wear offloading shoe. The pustule, that was identified last week, to the left fifth toe is resolved. She will follow-up in 2 weeks 05/03/18-she is seen in follow-up evaluation for a left great toe ulcer. She is compliant with offloading, otherwise noncompliant with glycemic control and smoking.  She has plateaued and there is  minimal improvement noted. We will transition to [place], replaced the insert to her surgical shoe and she will follow-up in one week 05/10/18- She is here in follow up evaluation for a left great toe ulcer. It appears stable despite measurement change. We will continue with same treatment plan and follow up next week. 05/24/18-She is seen in follow-up evaluation for a left great toe ulcer. She remains compliant with offloading, has made significant improvement in her diet, decreasing the amount of sugar/soda. She said her recent A1c was 10.9 which is lower than. She did see a diabetic nutritionist/educator yesterday. She continues to smoke. We will continue with the same treatment plan and she'll follow-up next week. 05/31/18- She is seen in follow-up evaluation for left great toe ulcer. She continues to remain compliant with offloading, continues to make improvement in her diet, increasing her water and decreasing the amount of sugar/soda. She does continue to smoke with no desire to quit. We will apply Prisma to the depth and Hydrofera Blue over. We have not received insurance authorization for oasis. She will follow up next week. 06/07/18-She is seen in follow-up evaluation for left great toe ulcer. It has stalled according to today's measurements although base appears stable. She says she saw a diabetic educator yesterday; her average blood sugars are less than 300 which is an improvement for her. She continues to smoke and states "that's my next step" She continues with water over soda. We will order for xray, culture and reinstate ace wrap compression prior to placing apligraf for next week. She is voicing no complaints or concerns. Her dressing will change to iodoflex over the next week in preparation for apligraf. 06/14/18-She is seen in follow-up evaluation for left great toe ulcer. Plain film x-ray performed last week was negative for osteomyelitis. Wound culture obtained last week grew  strep B and OSSA; she is initiated on keflex and cefdinir today; there is erythema to the toe which could be from ace wrap compression, she has a history of wrapping too tight and has has been encouraged to maintain ace wraps that we place today. We will hold off on application of apligraf today, will apply next week after antibiotic therapy has been initiated. She admits today that she has resumed taking a shower with her foot/toe submerged in water, she has been reminded to keep foot/toe out of the bath water. She will be seen in follow up next week 06/21/18-she is seen in follow-up evaluation for left great toe ulcer. She is tolerating antibiotic therapy with no GI disturbance. The wound is stable. Apligraf was applied today. She has been decreasing her smoking, only had 4 cigarettes yesterday and 1 today. She continues being more compliant in diabetic diet. She will follow-up next week for evaluation of site, if stable will remove at 2 weeks. 06/28/18- She is here in follow up evalution. Apligraf was placed last week, she states the dressing fell off on Tuesday and she was dressing with hydrofera blue. She is healed and will be discharged from the clinic today. She has been instructed to continue with smoking cessation, continue monitoring glucose levels, offloading for an additional 4 weeks and continue with hydrofera blue for additional two weeks for any possible microscopic opening. Readmission: 08/07/18 on evaluation today patient presents for reevaluation concerning the ulcer of her right great toe. She was previously discharged on 06/28/18 healed. Nonetheless she states that this began to show signs of drainage she subsequently  went to her primary care provider. Subsequently an x-ray was performed on 08/01/18 which was negative. The patient was also placed on antibiotics at that time. Fortunately they should have been effective for the infection. Nonetheless she's been experiencing some  improvement but still has a lot of drainage coming from the wound itself. 08/14/18 on evaluation today patient's wound actually does show signs of improvement in regard to the erythema at this point. She has completed the antibiotics. With that being said we did discuss the possibility of placing her in a total contact cast as of today although I think that I may want to give this just a little bit more time to ensure nothing recurrence as far as her infection is concerned. I do not want to put in the cast and risk infection at that time if things are not completely resolved. With that being said she is gonna require some debridement today. 08/21/18 on evaluation today patient actually appears to be doing okay in regard to her toe ulcer. She's been tolerating the dressing changes without complication. With that being said it does appear that she is ready and in fact I think it's appropriate for [place] to go ahead and initiate the total contact cast today. Nonetheless she will require some sharp debridement to prepare the wound for application. Overall I feel like things have been progressing well but we do need to do something to get this to close more readily. 08/24/18 patient seen today for reevaluation after having had the total contact cast applied on Tuesday. She seems to have done very well the wound appears to be doing great and overall I'm pleased with the progress that she's made. There were no abnormal areas of rubbing from the cast on her lower extremity. 08/30/18 on evaluation today patient actually appears to be completely healed in regard to her plantar toe ulcer. She tells me at this point she's been having a lot of issues with the cast. She almost fell a couple of times the state shall the step of her dog a couple times as well. This is been a very frustrating process for her other nonetheless she has completely healed the wound which is excellent news. Overall there does not appear to be  the evidence of infection at this time which is great news. 09/11/18 evaluation today patient presents for follow-up concerning her great toe ulcer on the left which has unfortunately reopened since I last saw her which was only a couple of weeks ago. Unfortunately she was not able to get in to get the shoe and potentially the AFO that's gonna be necessary due to her left foot drop. She continues with offloading shoe but this is not enough to prevent her from reopening it appears. When we last had her in the total contact cast she did well from a healing standpoint but unfortunately the wound reopened as soon as she came out of the cast within just a couple of weeks. 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 This is also part of the reason why she falls on a regular basis. I just do not believe that is gonna be safe for [place] to reinitiate the total contact cast as last time we had this on she fell 3 times one week  which is definitely not normal for her. 09/18/18 upon evaluation today the patient actually appears to be doing about the same in regard to her toe ulcer. She did not contact [facility] as I asked her to even though I had given her the prescription. In fact she actually states that she has no idea where the prescription is. She did apparently call [facility] and they told her that all she needed to do was bring the prescription in order to be able to be seen and work on getting the AFO for her left foot. With all that being said she still does not have an appointment and I'm not sure were things stand that regard. I will give her a new prescription today in order to contact them to get this set up. 09/25/18 on evaluation today patient actually appears to be doing about the same in regard to her toes  ulcer. She does have a small areas which seems to have a lot of callous buildup around the edge of the wound which is going to need sharp debridement today. She still is waiting to be scheduled for evaluation with [facility] for possibility of an AFO. She states there supposed to call her tomorrow to get this set up. Unfortunately it does appear that her foot specifically the toe area is showing signs of erythema. There does not appear to be any systemic infection which is in these good news. CAI, BABAUTA A. ([id number]) 10/02/18 on evaluation today patient actually appears to be doing about the same in regard to her toe ulcer. This really has not done too well although it's not significantly larger it's also not significantly smaller. She has been tolerating the dressing changes without complication. She actually has her appointment with [facility] and [facility] tomorrow to hopefully be measured for obtaining and AFO splint. I think this would be helpful preventing this from reoccurring. We had contemplated starting the cast this week although to be honest I am reluctant to do that as she's been having nausea, vomiting, and seizure activity over the past three days. She has a history of seizures and have been told is nothing that can be done for these. With that being said I do believe that along with the seizures have the nausea vomiting which upon further questioning doesn't seem to be the normal for her and makes me concerned for the possibility of infection or something else going on. I discussed this with the patient and her mother during the office visit today. I do not feel the wound is effective but maybe something else. The responses this was "this just happens to her at times and we don't know why". They did not seem to be interested in going to the hospital to have this checked out further. 10/09/18 on evaluation today patient presents for follow-up concerning her ongoing toe ulcer. She has  been tolerating the dressing changes without complication. Fortunately there does not appear to be any evidence of infection which is great news however I do think that the patient would benefit from going ahead for with the total contact cast. She's actually in a wheelchair today she tells me that she will use her walker if we initiate the cast. I was very specific about the fact that if we were gonna do the cast I wanted to make sure that she was using the walker in order to prevent any falls. She tells me she does not have stairs that she has to traverse on a regular basis at  her home. She has not had any seizures since last week again that something that happens to her often she tells me she did talk to [employer] and they said that it may take up to three weeks to get the brace approved for her. Hopefully that will not take that long but nonetheless in the meantime I do think the cast could be of benefit. 10/12/18 on evaluation today patient appears to be doing rather well in regard to her toe ulcer. It's just been a few days and already this is significantly improved both as far as overall appearance and size. Fortunately there's no sign of infection. She is here for her first obligatory cast change. 10/19/18 Seen today for follow up and management of left great toe ulcer. Wound continues to show improvement. Noted small open area with seroussang drainage with palpation. Denies any increased pain or recent fevers during visit. She will continue calcium alginate with offloading shoe. Denies any questions or concerns during visit. 10/26/18 on evaluation today patient appears to be doing about the same as when I last saw her in regard to her wound bed. Fortunately there does not appear to be any signs of infection. Unfortunately she continues to have a breakdown in regard to the toe region any time that she is not in the cast. It takes almost no time at all for this to happen. Nonetheless she still has  not heard anything from the brace being made by [employer] as to when exactly this will be available to her. Fortunately there is no signs of infection at this time. 10/30/18 on evaluation today patient presents for application of the total contact cast as we just received him this morning. Fortunately we are gonna be able to apply this to her today which is great news. She continues to have no significant pain which is good news. Overall I do feel like things have been improving while she was the cast is when she doesn't have a cast that things get worse. She still has not really heard anything from [facility] regarding her brace. 11/02/18 upon evaluation today patient's wound already appears to be doing significantly better which is good news. Fortunately there does not appear to be any signs of infection also good news. Overall I do think the total contact cast as before is helping to heal this area unfortunately it's just not gonna likely keep the area closed and healed without her getting her brace at least. Again the foot drop is a significant issue for her. 11/09/18 on evaluation today patient appears to be doing excellent in regard to her toe ulcer which in fact is completely healed. Fortunately we finally got the situation squared away with the paperwork which was needed to proceed with getting her brace approved by Medicaid. I have filled that out unfortunately that information has been sent to the orthopedic office that I worked at 2 1/2 years ago and not tired Current wound care measures. Fortunately she seems to be doing very well at this time. 11/23/18 on evaluation today patient appears to be doing More Poorly Compared to Last Time I Saw Her. At [facility] She Had Completely Healed. Currently she is continuing to have issues with reopening. She states that she just found out that the brace was approved through Medicaid now she just has to go get measured in order to have this fitted for her and  then made. Subsequently she does not have an appointment for this yet that is going to  complicate things we obviously cannot put her back in the cast if we do not have everything measured because they're not gonna be able to measure her foot while she is in the cast. 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 Obviously this is unfortunate and does have me somewhat worried as well. 11/30/18 on evaluation today patient's toe ulcer actually appears to be doing fairly well. The good news is she will be getting her brace in the shoes next week on Wednesday. Hopefully we will be able to get this to heal without having to go back in the cast however she may need the cast in order to get the wound completely heal and then go from there. Fortunately there's no signs of infection at this time. 12/07/18 on evaluation today patient fortunately did receive her brace and she states she could tell this definitely makes her walk better. With that being said she's been having issues with her toe where she noticed yesterday there was a lot of tissue that was loosing off this appears to be much larger than what it was previous. She also states that her leg has been read putting much across the top of her foot just about the ankle although this seems to be receiving somewhat. The total area is still red and appears to be someone infected as best I can tell. She is previously taken Bactrim and that may be a good option for her today as well. We are gonna see what I wound culture shows as well and I think that this is definitely appropriate. With that being said outside of the culture I still need to initiate something in the interim and that's what I'm gonna go ahead and select Bactrim is a good option for her. 12/14/18 on evaluation today patient appears to be doing better in regard to her left great toe ulcer as compared to  last week's evaluation. There's still some erythema although this is significantly improved which is excellent news. Overall I do believe that she is making good progress is still gonna take some time before she is where I would like her to be from the standpoint of being able to place her back into the total contact cast. Hopefully we will be where we need to be by next week. 12/21/18 on evaluation today patient actually appears to be doing poorly in regard to her toe ulcer. She's been tolerating the dressing changes without complication. Fortunately there's no signs of systemic infection although she does have a lot of drainage from the toe ulcer and this does seem to be causing some issues at this point. She does have erythema on the distal portion of her toe that appears to be likely cellulitis. 12/28/18 on evaluation today patient actually appears to be doing a little better in my pinion in regard to her toe ulcer. With that being said she still does have some evidence of infection at this time and for her culture she had both E. coli as well as enterococcus as organisms noted on evaluation. For that reason I think that though the Keflex likely has treated the E. coli rather well this has really done nothing for the enterococcus. We are going to have to initiate treatment for this specifically. SAMAYRA, MATTS A. ([id number]) 01/04/19 on evaluation today patient's toe actually appears to be doing better from the standpoint of infection. She currently  would like to see about putting the cash back on I think that this is appropriate as long as she takes care of it and keeps it from getting wet. She is gonna have some drainage we can definitely pass this up with Drawtex and alginate to try to prevent as much drainage as possible from causing the problems. With that being said I do want to at least try her with the cast between now and Tuesday. If there any issues we can't continue to use it then I will  discontinue the use of the cast at that point. 01/08/19 on evaluation today patient actually appears to be doing very well as far as her foot ulcer specifically the great toe on the left is concerned. She did have an area of rubbing on the medial aspect of her left ankle which again is from the cast. Fortunately there's no signs of infection at this point in this appears to be a very slight skin breakdown. The patient tells me she felt it rubbing but didn't think it was that bad. Fortunately there is no signs of active infection at this time which is good news. No fevers, chills, nausea, or vomiting noted at this time. 01/15/19 on evaluation today patient actually appears to be doing well in regard to her toe ulcer. Again as previous she seems to do well and she has the cast on which indicates to me that during the time she doesn't have a cast on she's putting way too much pressure on this region. Obviously I think that's gonna be an issue as with the current national emergency concerning the Covid-19 Virus it has been recommended that we discontinue the use of total contact casting by the chief medical officer of our company, Dr. Simona Huh. The reasoning is that if a patient becomes sick and cannot come into have the cast removed they could not just leave this on for an additional two weeks. Obviously the hospitals also do not want to receive patient's who are sick into the emergency department to potentially contaminate the region and spread the Covid-19 Virus among other sick individuals within the hospital system. Therefore at this point we are suspending the use of total contact cast until the current emergency subsides. This was all discussed with the patient today as well. 01/22/19 on evaluation today patient's wound on her left great toe appears to be doing slightly worse than previously noted last week. She tells me that she has been on this quite a bit in fact she tells me she's been awake for 38  straight hours. This is due to the fact that she's having to care for grandparents because nobody else will. She has been taking care of them for five the last seven days since I've seen her they both have dementia his is from a stroke and her grandmother's was progressive. Nonetheless she states even her mom who knows her condition and situation has only help two of those days to take care of them she's been taking care of the rest. Fortunately there does not appear to be any signs of active infection in regard to her toe at this point although obviously it doesn't look as good as it did previous. I think this is directly related to her not taking off the pressure and friction by way of taking things easy. Though I completely understand what's going on. 01/29/19 on evaluation today patient's tools are actually appears to be showing some signs of improvement today compared to last  week's evaluation as far as not necessarily the overall size of the wound but the fact that she has some new skin growth in between the two ends of the wound opening. Overall I feel like she has done well she states that she had a family member give her what sounds to be a CAM walker boot which has been helpful as well. 02/05/19 on evaluation today patient's wound bed actually appears to be doing significantly better in regard to her overall appearance of the size of the wound. With that being said she is still having an issue with offloading efficiently enough to get this to close. Apparently there is some signs of infection at this point as well unfortunately. Previously she's done well of Augmentin I really do not see anything that needs to be culture currently but there theme and cellulitis of the foot that I'm seeing I'm gonna go ahead and place her on an antibiotic today to try to help clear this up. 02/12/2019 on evaluation today patient actually appears to be doing poorly in regard to her overall wound status. She tells  me she has been using her offloading shoe but actually comes in today wearing her tennis shoe with the AFO brace. Again as I previously discussed with her this is really not sufficient to allow the area to heal appropriately. Nonetheless she continues to be somewhat noncompliant and I do wonder based on what she has told my nurse in the past as to whether or not a good portion of this noncompliance may be recreational drug and alcohol related. She has had a history of heroin overdose and this was fairly recently in the past couple of months that have been seeing her. Nonetheless overall I feel like her wound looks significantly worse today compared to what it was previous. She still has significant erythema despite the Augmentin I am not sure that this is an appropriate medication for her infection I am also concerned that the infection may have gone down into her bone. 02/19/19 on evaluation today patient actually appears to be doing about the same in regard to her toe ulcer. Unfortunately she continues to show signs of bone exposure and infection at this point. There does not appear to be any evidence of worsening of the infection but I'm also not really sure that it's getting significantly better. She is on the Augmentin which should be sufficient for the Staphylococcus aureus infection that she has at this point. With that being said she may need IV antibiotics to more appropriately treat this. We did have a discussion today about hyperbaric option therapy. 02/28/19 on evaluation today patient actually appears to be doing much worse in regard to the wound on her left great toe as compared to even my previous evaluation last week. Unfortunately this seems to be training in a pretty poor direction. Her toe was actually now starting to angle laterally and I can actually see the entire joint area of the proximal portion of the digit where is the distal portion of the digit again is no longer even in  contact with the joint line. Unfortunately there's a lot more necrotic tissue around the edge and the toe appears to be showing signs of becoming gangrenous in my pinion. I'm very concerned about were things stand at this point. She did see infectious disease and they are planning to send in a prescription for Sivextro for her and apparently this has been approved. With that being said I don't think she  should avoid taking this but at the same time I'm not sure that it's gonna be sufficient to save her toe at this point. 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 03/05/19 on evaluation today patient actually appears to be doing quite well in regard to her toe all things considering. She still has bone exposed but there appears to be much less your thing on overall the appearance of the wound and the toe itself is dramatically improved. She still does have some issues currently obviously with infection she did see vascular as well and there concerned that her blood flow to the toad. For that reason they are setting up for an angiogram next week. 03/14/19 on evaluation today patient appears to be doing very poor in regard to her toe and specifically in regard to the ulceration and the fact that she's starting to notice the toe was leaning even more towards the lateral aspect and the complete joint is visible on the proximal aspect of the joint. Nonetheless she's also noted a significant odor and the tip of the toe is turning more dark and necrotic appearing. Overall I think she is getting worse not better as far as this is concerned. For that reason I am recommending at this point that she likely needs to be seen for likely amputation. READMISSION 03/19/2021 This is a patient that we cared for in this  clinic for a prolonged period of time in 2019 and 2020 with a left foot and left first toe wound. 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 ([id number]) 04/09/20 by Dr. Luana Shu. In December 2021 she had an ulcer on her right great toe as well as the fourth and fifth toes. 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 She also had an angiogram at that time and underwent angioplasty of the right anterior tibial artery. In any case she claims that the wound on the right foot is closed I did not look at this today which was probably an oversight although I think that should be done next week. After her surgery she developed a dehiscence but I do not see any follow-up of this. According to Dr. Deborra Medina last review that she was out of the area being cared for by another physician but recently came back to his attention. 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 A culture of this area showed E. coli apparently before she came back to see Dr. Luana Shu she was supposed to be receiving antibiotics but she did not really take them. Nor is she offloading this area at all. Finally her last hemoglobin A1c listed in epic was in March 2022 at 14.1 she says things are a lot better since then although I am not sure. 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 She had this wound at the time. During this admission she had E. coli sepsis a TEE was negative. Past medical history is extensive and includes type 2 diabetes with peripheral neuropathy cardiomyopathy with an ejection fraction of 33%, hypertension, hyperlipidemia chronic renal failure stage III history of substance  abuse with cocaine although she claims to be clean now verified by her mother. She is still a heavy  cigarette smoker. She has a history of bipolar disorder seizure disorder ABI in our clinic was 1.05 6/1; left midfoot in the setting of a TMA done previously. Round circular wound with a "knuckle" of protruding tissue. The problem is that the knuckle was not attached to any of the surrounding granulation and this probed proximally widely I removed a large portion of this tissue. This wound goes with considerable undermining laterally. I do not feel any bone there was no purulence but this is a deep wound. 6/8; in spite of the debridement I did last week. She arrives with a wound looking exactly the same. A protruding "knuckle" of tissue nonadherent to most of the surrounding tissue. There is considerable depth around this from 6-12 o'clock at 2.7 cm and undermining of 1 cm. This does not look overtly infected and the x-ray I did last week was negative for any osseous abnormalities. We have been using silver collagen 6/15; deep tissue culture I did last week showed moderate staph aureus and moderate Pseudomonas. This will definitely require prolonged antibiotic therapy. The pathology on the protuberant area was negative for malignancy fungus etc. 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 We have been using silver collagen. I am going to be prescribing Levaquin for 2 weeks. Her CT scan of the foot is down for 7/5 6/22; CT scan of the foot on 7 5. She says she has hardware in the left leg from her previous fracture. She is on the Levaquin for the deep tissue culture I did that showed methicillin sensitive staph aureus and Pseudomonas. I gave her a 2-week supply and she will have another week. She arrives in clinic today with the same protuberant tissue however this is nonadherent to the tissue surrounding it. I am really at a loss to explain this unless there is underlying deep tissue infection 6/29; patient presents for 1 week follow-up. She  has been using collagen to the wound bed. She reports taking her antibiotics as prescribed.She has no complaints or issues today. She denies signs of infection. Electronic Signature(s) Signed: 04/21/2021 1:46:11 PM By: Kalman Shan DO Entered By: Kalman Shan on 04/21/2021 13:39:09 [name] ([id number]) -------------------------------------------------------------------------------- Physical Exam Details Patient Name: Heather Dear A. Date of Service: 04/21/2021 12:45 PM Medical Record Number: [id number] Patient Account Number: [id number] Date of Birth/Sex: [birth year] (45 y.o. F) Treating RN: Cornell Barman Primary Care Provider: Raelene Bott Other Clinician: Referring Provider: Raelene Bott Treating Provider/Extender: Yaakov Guthrie in Treatment: 4 Constitutional . Cardiovascular . Psychiatric . Notes Left foot: Wound to the plantar aspect that that has some granulation tissue and nonviable tissue present.. Most of the tissue is pale tissue and overall does not look very healthy. There is significant callus circumferentially and maceration noted. No surrounding signs of infection Electronic Signature(s) Signed: 04/21/2021 1:46:11 PM By: Kalman Shan DO Entered By: Kalman Shan on 04/21/2021 13:44:23 [name] ([id number]) -------------------------------------------------------------------------------- Physician Orders Details Patient Name: Heather Dear A. Date of Service: 04/21/2021 12:45 PM Medical Record Number: [id number] Patient Account Number: [id number] Date of Birth/Sex: Aug 23, 1976 (45 y.o. F) Treating RN: Cornell Barman Primary Care Provider: Raelene Bott Other Clinician: Referring Provider: Raelene Bott Treating Provider/Extender: Yaakov Guthrie in Treatment: 4 Verbal / Phone Orders: No Diagnosis Coding Follow-up Appointments o Return Appointment in 1 week. Bathing/ Shower/ Hygiene o Clean wound with  Normal Saline  or wound cleanser. Off-Loading Left Lower Extremity o Open toe surgical shoe with peg assist. Medications-Please add to medication list. o P.O. Antibiotics - Levaquin Wound Treatment Wound #7 - Foot Wound Laterality: Plantar, Left, Midline Cleanser: Soap and Water 3 x Per Week/30 Days Discharge Instructions: Gently cleanse wound with antibacterial soap, rinse and pat dry prior to dressing wounds Peri-Wound Care: Desitin Maximum Strength Ointment 4 (oz) 3 x Per Week/30 Days Primary Dressing: Prisma 4.34 (in) 3 x Per Week/30 Days Discharge Instructions: moisten with saline-tuck in undermining area Secondary Dressing: Gauze 3 x Per Week/30 Days Discharge Instructions: As directed: dry, moistened with saline or moistened with Dakins Solution Secured With: [employer] Sterile or Non-Sterile 6-ply 4.5x4 (yd/yd) 3 x Per Week/30 Days Discharge Instructions: Apply Kerlix as directed Electronic Signature(s) Signed: 04/21/2021 1:46:11 PM By: Kalman Shan DO Signed: 05/10/2021 1:36:15 PM By: Gretta Cool, BSN, RN, CWS, [name] RN, BSN Entered By: Gretta Cool, BSN, RN, CWS, [name] on 04/21/2021 13:40:54 [name] ([id number]) -------------------------------------------------------------------------------- Problem List Details Patient Name: Heather Dear A. Date of Service: 04/21/2021 12:45 PM Medical Record Number: [id number] Patient Account Number: [id number] Date of Birth/Sex: Mar 07, 1976 (45 y.o. F) Treating RN: Cornell Barman Primary Care Provider: Raelene Bott Other Clinician: Referring Provider: Raelene Bott Treating Provider/Extender: Yaakov Guthrie in Treatment: 4 Active Problems ICD-10 Encounter Code Description Active Date MDM Diagnosis E11.621 Type 2 diabetes mellitus with foot ulcer 03/19/2021 No Yes 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 03/19/2021 No Yes severity E11.42 Type 2 diabetes mellitus with diabetic polyneuropathy 03/19/2021 No  Yes Inactive Problems Resolved Problems Electronic Signature(s) Signed: 04/21/2021 1:46:11 PM By: Kalman Shan DO Entered By: Kalman Shan on 04/21/2021 13:36:03 Fallert, Manjot A. ([id number]) -------------------------------------------------------------------------------- Progress Note Details Patient Name: Heather Dear A. Date of Service: 04/21/2021 12:45 PM Medical Record Number: [id number] Patient Account Number: [id number] Date of Birth/Sex: [birth year] (45 y.o. F) Treating RN: Cornell Barman Primary Care Provider: Raelene Bott Other Clinician: Referring Provider: Raelene Bott Treating Provider/Extender: Yaakov Guthrie in Treatment: 4 Subjective Chief Complaint Information obtained from Patient Left great toe ulcer 03/19/2021; patient referred by Dr. Luana Shu who has been looking after her left foot for quite a period of time for review of a nonhealing area in the left midfoot History of Present Illness (HPI) 01/18/18-She is here for initial evaluation of the left great toe ulcer. She is a poor historian in regards to timeframe in detail. She states approximately 4 weeks ago she lacerated her toe on something in the house. She followed up with her primary care who placed her on Bactrim and ultimately a second dose of Bactrim prior to coming to wound clinic. She states she has been treating the toe with peroxide, Betadine and a Band-Aid. She did not check her blood sugar this morning but checked it yesterday morning it was 327; she is unaware of a recent A1c and there are no current records. She saw Dr. she would've orthopedics last week for an old injury to the left ankle, she states he did not see her toe, nor did she bring it to his attention. She smokes approximately 1 pack cigarettes a day. Her social situation is concerning, she arrives this morning with her mother who appears extremely intoxicated/under the influence; her mother was asked to leave the room and be  monitored by the patient's grandmother. The patient's aunt then accompanied the patient and the room throughout the rest of the appointment. We had a  lengthy discussion regarding the deleterious effects of uncontrolled hyperglycemia and smoking as it relates to wound healing and overall health. She was strongly encouraged to decrease her smoking and get her diabetes under better control. She states she is currently on a diet and has cut down her [facility] consumption. The left toe is erythematous, macerated and slightly edematous with malodor present. The edema in her left foot is below her baseline, there is no erythema streaking. We will treat her with Santyl, doxycycline; we have ordered and xray, culture and provided a Peg assist surgical shoe and cultured the wound. 01/25/18-She is here in follow-up evaluation for a left great toe ulcer and presents with an abscess to her suprapubic area. She states her blood sugars remain elevated, feeling "sick" and if levels are below 250, but she is trying. She has made no attempt to decrease her smoking stating that we "can't take away her food in her cigarettes". She has been compliant with offloading using the PEG assist you. She is using Santyl daily. the culture obtained last week grew staph aureus and Enterococcus faecalis; continues on the doxycycline and Augmentin was added on Monday. The suprapubic area has erythema, no femoral variation, purple discoloration, minimal induration, was accessed with a cotton tip applicator with sanguinopurulent drainage, this was cultured, I suspect the current antibiotic treatment will cover and we will not add anything to her current treatment plan. She was advised to go to urgent care or ER with any change in redness, induration or fever. 02/01/18-She is here in follow-up evaluation for left great toe ulcers and a new abdominal abscess from last week. She was able to use packing until earlier this week, where she  "forgot it was there". She states she was feeling ill with GI symptoms last week and was not taking her antibiotic. She states her glucose levels have been predominantly less than 200, with occasional levels between 200-250. She thinks this was contributing to her GI symptoms as they have resolved without intervention. There continues to be significant laceration to left toe, otherwise it clinically looks stable/improved. There is now less superficial opening to the lateral aspect of the great toe that was residual blister. We will transition to [facility] to all wounds, she will continue her Augmentin. If there is no change or deterioration next week for reculture. 02/08/18-She is here in follow-up evaluation for left great toe ulcer and abdominal ulcer. There is an improvement in both wounds. She has been wrapping her left toe with coban, not by our direction, which has created an area of discoloration to the medial aspect; she has been advised to NOT use coban secondary to her neuropathy. She states her glucose levels have been high over this last week ranging from 200-350, she continues to smoke. She admits to being less compliant with her offloading shoe. We will continue with same treatment plan and she will follow-up next week. 02/15/18-She is here in follow-up evaluation for left great toe ulcer and abdominal ulcer. The abdominal ulcer is epithelialized. The left great toe ulcer is improved and all injury from last week using the Coban wrap is resolved, the lateral ulcer is healed. She admits to noncompliance with wearing offloading shoe and admits to glucose levels being greater than 300 most of the week. She continues to smoke and expresses no desire to quit. There is one area medially that probes deeper than it has historically, erythema to the toe and dorsal foot has consistently waxed and waned. There  is no overt signs of cellulitis or infection but we will culture the wound for any  occult infection given the new area of depth and erythema. We will hold off on sensitivities for initiation of antibiotic therapy. 02/22/18-She is here in follow up evaluation for left great toe ulcer. There is overall significant improvement in both wound appearance, erythema and edema with changes made last week. She was not initiated on antibiotic therapy. Culture obtained last week showed oxacillin sensitive staph aureus, sensitive to clindamycin. Clindamycin has been called into the pharmacy but she has been instructed to hold off on initiation secondary to overall clinical improvement and her history of antibiotic intolerance. She has been instructed to contact the clinic with any noted changes/deterioration and the wound, erythema, edema and/or pain. She will follow-up next week. She continues to smoke and her glucose levels remain elevated >250; she admits to compliance with offloading shoe 03/01/18 on evaluation today patient appears to be doing fairly well in regard to her left first toe ulcer. She has been tolerating the dressing changes with the [facility] Dressing without complication and overall this has definitely showed signs of improvement according to records as well is what the patient tells me today. I'm very pleased in that regard. She is having no pain today 03/08/18 She is here for follow up evaluation of a left great toe ulcer. She remains non-compliant with glucose control and smoking cessation; glucose levels consistently >200. She states that she got new shoe inserts/peg assist. She admits to compliance with offloading. Since my last evaluation there is significant improvement. We will switch to prisma at this time and she will follow up next week. She is noted to be tachycardic at this appointment, heart rate 120s; she has a history of heart rate 70-130 according to our records. She admits to extreme agitation r/t personal issues; she was advised to monitor her heartrate and  contact her physician if it does not return to a more normal range (<100). She takes cardizem twice daily. 03/15/18-She is here in follow-up evaluation for left great toe ulcer. She remains noncompliant with glucose control and smoking cessation. She admits to compliance with wearing offloading shoe. The ulcer is improved/stable and we will continue with the same treatment plan and she will follow-up next week 03/22/18-She is here for evaluation for left great toe ulcer. There continues to be significant improvement despite recurrent hyperglycemia (over 500 yesterday) and she continues to smoke. She has been compliant with offloading and we will continue with same treatment plan and she will follow-up next week. Morissette, Giana A. ([id number]) 03/29/18-She is here for evaluation for left great toe ulcer. Despite continuing to smoke and uncontrolled diabetes she continues to improve. She is compliant with offloading shoe. We will continue with the same treatment plan and she will follow-up next week 04/05/18- She is here in follow up evaluation for a left great toe ulcer; she presents with small pustule to left fifth toe (resembles ant bite). She admits to compliance with wearing offloading shoe; continues to smoke or have uncontrolled blood glucose control. There is more callus than usual with evidence of bleeding; she denies known trauma. 04/12/18-She is here for evaluation of left great toe ulcer. Despite noncompliance with glycemic control and smoking she continues to make improvement. She continues to wear offloading shoe. The pustule, that was identified last week, to the left fifth toe is resolved. She will follow-up in 2 weeks 05/03/18-she is seen in follow-up evaluation for a left  great toe ulcer. She is compliant with offloading, otherwise noncompliant with glycemic control and smoking. She has plateaued and there is minimal improvement noted. We will transition to [facility], replaced the  insert to her surgical shoe and she will follow-up in one week 05/10/18- She is here in follow up evaluation for a left great toe ulcer. It appears stable despite measurement change. We will continue with same treatment plan and follow up next week. 05/24/18-She is seen in follow-up evaluation for a left great toe ulcer. She remains compliant with offloading, has made significant improvement in her diet, decreasing the amount of sugar/soda. She said her recent A1c was 10.9 which is lower than. She did see a diabetic nutritionist/educator yesterday. She continues to smoke. We will continue with the same treatment plan and she'll follow-up next week. 05/31/18- She is seen in follow-up evaluation for left great toe ulcer. She continues to remain compliant with offloading, continues to make improvement in her diet, increasing her water and decreasing the amount of sugar/soda. She does continue to smoke with no desire to quit. We will apply Prisma to the depth and Hydrofera Blue over. We have not received insurance authorization for oasis. She will follow up next week. 06/07/18-She is seen in follow-up evaluation for left great toe ulcer. It has stalled according to today's measurements although base appears stable. She says she saw a diabetic educator yesterday; her average blood sugars are less than 300 which is an improvement for her. She continues to smoke and states "that's my next step" She continues with water over soda. We will order for xray, culture and reinstate ace wrap compression prior to placing apligraf for next week. She is voicing no complaints or concerns. Her dressing will change to iodoflex over the next week in preparation for apligraf. 06/14/18-She is seen in follow-up evaluation for left great toe ulcer. Plain film x-ray performed last week was negative for osteomyelitis. Wound culture obtained last week grew strep B and OSSA; she is initiated on keflex and cefdinir today; there is  erythema to the toe which could be from ace wrap compression, she has a history of wrapping too tight and has has been encouraged to maintain ace wraps that we place today. We will hold off on application of apligraf today, will apply next week after antibiotic therapy has been initiated. She admits today that she has resumed taking a shower with her foot/toe submerged in water, she has been reminded to keep foot/toe out of the bath water. She will be seen in follow up next week 06/21/18-she is seen in follow-up evaluation for left great toe ulcer. She is tolerating antibiotic therapy with no GI disturbance. The wound is stable. Apligraf was applied today. She has been decreasing her smoking, only had 4 cigarettes yesterday and 1 today. She continues being more compliant in diabetic diet. She will follow-up next week for evaluation of site, if stable will remove at 2 weeks. 06/28/18- She is here in follow up evalution. Apligraf was placed last week, she states the dressing fell off on Tuesday and she was dressing with hydrofera blue. She is healed and will be discharged from the clinic today. She has been instructed to continue with smoking cessation, continue monitoring glucose levels, offloading for an additional 4 weeks and continue with hydrofera blue for additional two weeks for any possible microscopic opening. Readmission: 08/07/18 on evaluation today patient presents for reevaluation concerning the ulcer of her right great toe. She was previously discharged on  06/28/18 healed. Nonetheless she states that this began to show signs of drainage she subsequently went to her primary care provider. Subsequently an x-ray was performed on 08/01/18 which was negative. The patient was also placed on antibiotics at that time. Fortunately they should have been effective for the infection. Nonetheless she's been experiencing some improvement but still has a lot of drainage coming from the wound  itself. 08/14/18 on evaluation today patient's wound actually does show signs of improvement in regard to the erythema at this point. She has completed the antibiotics. With that being said we did discuss the possibility of placing her in a total contact cast as of today although I think that I may want to give this just a little bit more time to ensure nothing recurrence as far as her infection is concerned. I do not want to put in the cast and risk infection at that time if things are not completely resolved. With that being said she is gonna require some debridement today. 08/21/18 on evaluation today patient actually appears to be doing okay in regard to her toe ulcer. She's been tolerating the dressing changes without complication. With that being said it does appear that she is ready and in fact I think it's appropriate for [place] to go ahead and initiate the total contact cast today. Nonetheless she will require some sharp debridement to prepare the wound for application. Overall I feel like things have been progressing well but we do need to do something to get this to close more readily. 08/24/18 patient seen today for reevaluation after having had the total contact cast applied on Tuesday. She seems to have done very well the wound appears to be doing great and overall I'm pleased with the progress that she's made. There were no abnormal areas of rubbing from the cast on her lower extremity. 08/30/18 on evaluation today patient actually appears to be completely healed in regard to her plantar toe ulcer. She tells me at this point she's been having a lot of issues with the cast. She almost fell a couple of times the state shall the step of her dog a couple times as well. This is been a very frustrating process for her other nonetheless she has completely healed the wound which is excellent news. Overall there does not appear to be the evidence of infection at this time which is great  news. 09/11/18 evaluation today patient presents for follow-up concerning her great toe ulcer on the left which has unfortunately reopened since I last saw her which was only a couple of weeks ago. Unfortunately she was not able to get in to get the shoe and potentially the AFO that's gonna be necessary due to her left foot drop. She continues with offloading shoe but this is not enough to prevent her from reopening it appears. When we last had her in the total contact cast she did well from a healing standpoint but unfortunately the wound reopened as soon as she came out of the cast within just a couple of weeks. 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 This is also part of the reason why she falls on a regular basis. I just do not believe that is gonna be safe for [place] to reinitiate the total  contact cast as last time we had this on she fell 3 times one week which is definitely not normal for her. 09/18/18 upon evaluation today the patient actually appears to be doing about the same in regard to her toe ulcer. She did not contact [employer] as I asked her to even though I had given her the prescription. In fact she actually states that she has no idea where the prescription is. She did apparently call [employer] and they told her that all she needed to do was bring the prescription in order to be able to be seen and work on getting the AFO for her left foot. With all that being said she still does not have an appointment and I'm not sure were things stand that regard. I will give her a new prescription today in order to contact them to get this set up. SHAILYN, HAYENGA A. ([id number]) 09/25/18 on evaluation today patient actually appears to be doing about the same in regard to her toes ulcer. She does have a small  areas which seems to have a lot of callous buildup around the edge of the wound which is going to need sharp debridement today. She still is waiting to be scheduled for evaluation with [facility] for possibility of an AFO. She states there supposed to call her tomorrow to get this set up. Unfortunately it does appear that her foot specifically the toe area is showing signs of erythema. There does not appear to be any systemic infection which is in these good news. 10/02/18 on evaluation today patient actually appears to be doing about the same in regard to her toe ulcer. This really has not done too well although it's not significantly larger it's also not significantly smaller. She has been tolerating the dressing changes without complication. She actually has her appointment with [facility] and [place] tomorrow to hopefully be measured for obtaining and AFO splint. I think this would be helpful preventing this from reoccurring. We had contemplated starting the cast this week although to be honest I am reluctant to do that as she's been having nausea, vomiting, and seizure activity over the past three days. She has a history of seizures and have been told is nothing that can be done for these. With that being said I do believe that along with the seizures have the nausea vomiting which upon further questioning doesn't seem to be the normal for her and makes me concerned for the possibility of infection or something else going on. I discussed this with the patient and her mother during the office visit today. I do not feel the wound is effective but maybe something else. The responses this was "this just happens to her at times and we don't know why". They did not seem to be interested in going to the hospital to have this checked out further. 10/09/18 on evaluation today patient presents for follow-up concerning her ongoing toe ulcer. She has been tolerating the dressing changes without complication.  Fortunately there does not appear to be any evidence of infection which is great news however I do think that the patient would benefit from going ahead for with the total contact cast. She's actually in a wheelchair today she tells me that she will use her walker if we initiate the cast. I was very specific about the fact that if we were gonna do the cast I wanted to make sure that she was using the walker in order to prevent any falls. She tells me  she does not have stairs that she has to traverse on a regular basis at her home. She has not had any seizures since last week again that something that happens to her often she tells me she did talk to [employer] and they said that it may take up to three weeks to get the brace approved for her. Hopefully that will not take that long but nonetheless in the meantime I do think the cast could be of benefit. 10/12/18 on evaluation today patient appears to be doing rather well in regard to her toe ulcer. It's just been a few days and already this is significantly improved both as far as overall appearance and size. Fortunately there's no sign of infection. She is here for her first obligatory cast change. 10/19/18 Seen today for follow up and management of left great toe ulcer. Wound continues to show improvement. Noted small open area with seroussang drainage with palpation. Denies any increased pain or recent fevers during visit. She will continue calcium alginate with offloading shoe. Denies any questions or concerns during visit. 10/26/18 on evaluation today patient appears to be doing about the same as when I last saw her in regard to her wound bed. Fortunately there does not appear to be any signs of infection. Unfortunately she continues to have a breakdown in regard to the toe region any time that she is not in the cast. It takes almost no time at all for this to happen. Nonetheless she still has not heard anything from the brace being made by [employer]  as to when exactly this will be available to her. Fortunately there is no signs of infection at this time. 10/30/18 on evaluation today patient presents for application of the total contact cast as we just received him this morning. Fortunately we are gonna be able to apply this to her today which is great news. She continues to have no significant pain which is good news. Overall I do feel like things have been improving while she was the cast is when she doesn't have a cast that things get worse. She still has not really heard anything from [name] regarding her brace. 11/02/18 upon evaluation today patient's wound already appears to be doing significantly better which is good news. Fortunately there does not appear to be any signs of infection also good news. Overall I do think the total contact cast as before is helping to heal this area unfortunately it's just not gonna likely keep the area closed and healed without her getting her brace at least. Again the foot drop is a significant issue for her. 11/09/18 on evaluation today patient appears to be doing excellent in regard to her toe ulcer which in fact is completely healed. Fortunately we finally got the situation squared away with the paperwork which was needed to proceed with getting her brace approved by Medicaid. I have filled that out unfortunately that information has been sent to the orthopedic office that I worked at 2 1/2 years ago and not tired Current wound care measures. Fortunately she seems to be doing very well at this time. 11/23/18 on evaluation today patient appears to be doing More Poorly Compared to Last Time I Saw Her. At [facility] She Had Completely Healed. Currently she is continuing to have issues with reopening. She states that she just found out that the brace was approved through Medicaid now she just has to go get measured in order to have this fitted for her and then made.  Subsequently she does not have an  appointment for this yet that is going to complicate things we obviously cannot put her back in the cast if we do not have everything measured because they're not gonna be able to measure her foot while she is in the cast. 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 Obviously this is unfortunate and does have me somewhat worried as well. 11/30/18 on evaluation today patient's toe ulcer actually appears to be doing fairly well. The good news is she will be getting her brace in the shoes next week on Wednesday. Hopefully we will be able to get this to heal without having to go back in the cast however she may need the cast in order to get the wound completely heal and then go from there. Fortunately there's no signs of infection at this time. 12/07/18 on evaluation today patient fortunately did receive her brace and she states she could tell this definitely makes her walk better. With that being said she's been having issues with her toe where she noticed yesterday there was a lot of tissue that was loosing off this appears to be much larger than what it was previous. She also states that her leg has been read putting much across the top of her foot just about the ankle although this seems to be receiving somewhat. The total area is still red and appears to be someone infected as best I can tell. She is previously taken Bactrim and that may be a good option for her today as well. We are gonna see what I wound culture shows as well and I think that this is definitely appropriate. With that being said outside of the culture I still need to initiate something in the interim and that's what I'm gonna go ahead and select Bactrim is a good option for her. 12/14/18 on evaluation today patient appears to be doing better in regard to her left great toe ulcer as compared to last week's evaluation. There's still some  erythema although this is significantly improved which is excellent news. Overall I do believe that she is making good progress is still gonna take some time before she is where I would like her to be from the standpoint of being able to place her back into the total contact cast. Hopefully we will be where we need to be by next week. 12/21/18 on evaluation today patient actually appears to be doing poorly in regard to her toe ulcer. She's been tolerating the dressing changes without complication. Fortunately there's no signs of systemic infection although she does have a lot of drainage from the toe ulcer and this does Vitug, Fatuma A. (GA:[id number]) seem to be causing some issues at this point. She does have erythema on the distal portion of her toe that appears to be likely cellulitis. 12/28/18 on evaluation today patient actually appears to be doing a little better in my pinion in regard to her toe ulcer. With that being said she still does have some evidence of infection at this time and for her culture she had both E. coli as well as enterococcus as organisms noted on evaluation. For that reason I think that though the Keflex likely has treated the E. coli rather well this has really done nothing for the enterococcus. We are going to have to initiate treatment for this specifically. 01/04/19 on evaluation today  patient's toe actually appears to be doing better from the standpoint of infection. She currently would like to see about putting the cash back on I think that this is appropriate as long as she takes care of it and keeps it from getting wet. She is gonna have some drainage we can definitely pass this up with Drawtex and alginate to try to prevent as much drainage as possible from causing the problems. With that being said I do want to at least try her with the cast between now and Tuesday. If there any issues we can't continue to use it then I will discontinue the use of the cast at that  point. 01/08/19 on evaluation today patient actually appears to be doing very well as far as her foot ulcer specifically the great toe on the left is concerned. She did have an area of rubbing on the medial aspect of her left ankle which again is from the cast. Fortunately there's no signs of infection at this point in this appears to be a very slight skin breakdown. The patient tells me she felt it rubbing but didn't think it was that bad. Fortunately there is no signs of active infection at this time which is good news. No fevers, chills, nausea, or vomiting noted at this time. 01/15/19 on evaluation today patient actually appears to be doing well in regard to her toe ulcer. Again as previous she seems to do well and she has the cast on which indicates to me that during the time she doesn't have a cast on she's putting way too much pressure on this region. Obviously I think that's gonna be an issue as with the current national emergency concerning the Covid-19 Virus it has been recommended that we discontinue the use of total contact casting by the chief medical officer of our company, Dr. Simona Huh. The reasoning is that if a patient becomes sick and cannot come into have the cast removed they could not just leave this on for an additional two weeks. Obviously the hospitals also do not want to receive patient's who are sick into the emergency department to potentially contaminate the region and spread the Covid-19 Virus among other sick individuals within the hospital system. Therefore at this point we are suspending the use of total contact cast until the current emergency subsides. This was all discussed with the patient today as well. 01/22/19 on evaluation today patient's wound on her left great toe appears to be doing slightly worse than previously noted last week. She tells me that she has been on this quite a bit in fact she tells me she's been awake for 38 straight hours. This is due to the fact  that she's having to care for grandparents because nobody else will. She has been taking care of them for five the last seven days since I've seen her they both have dementia his is from a stroke and her grandmother's was progressive. Nonetheless she states even her mom who knows her condition and situation has only help two of those days to take care of them she's been taking care of the rest. Fortunately there does not appear to be any signs of active infection in regard to her toe at this point although obviously it doesn't look as good as it did previous. I think this is directly related to her not taking off the pressure and friction by way of taking things easy. Though I completely understand what's going on. 01/29/19 on evaluation today patient's  tools are actually appears to be showing some signs of improvement today compared to last week's evaluation as far as not necessarily the overall size of the wound but the fact that she has some new skin growth in between the two ends of the wound opening. Overall I feel like she has done well she states that she had a family member give her what sounds to be a CAM walker boot which has been helpful as well. 02/05/19 on evaluation today patient's wound bed actually appears to be doing significantly better in regard to her overall appearance of the size of the wound. With that being said she is still having an issue with offloading efficiently enough to get this to close. Apparently there is some signs of infection at this point as well unfortunately. Previously she's done well of Augmentin I really do not see anything that needs to be culture currently but there theme and cellulitis of the foot that I'm seeing I'm gonna go ahead and place her on an antibiotic today to try to help clear this up. 02/12/2019 on evaluation today patient actually appears to be doing poorly in regard to her overall wound status. She tells me she has been using her offloading  shoe but actually comes in today wearing her tennis shoe with the AFO brace. Again as I previously discussed with her this is really not sufficient to allow the area to heal appropriately. Nonetheless she continues to be somewhat noncompliant and I do wonder based on what she has told my nurse in the past as to whether or not a good portion of this noncompliance may be recreational drug and alcohol related. She has had a history of heroin overdose and this was fairly recently in the past couple of months that have been seeing her. Nonetheless overall I feel like her wound looks significantly worse today compared to what it was previous. She still has significant erythema despite the Augmentin I am not sure that this is an appropriate medication for her infection I am also concerned that the infection may have gone down into her bone. 02/19/19 on evaluation today patient actually appears to be doing about the same in regard to her toe ulcer. Unfortunately she continues to show signs of bone exposure and infection at this point. There does not appear to be any evidence of worsening of the infection but I'm also not really sure that it's getting significantly better. She is on the Augmentin which should be sufficient for the Staphylococcus aureus infection that she has at this point. With that being said she may need IV antibiotics to more appropriately treat this. We did have a discussion today about hyperbaric option therapy. 02/28/19 on evaluation today patient actually appears to be doing much worse in regard to the wound on her left great toe as compared to even my previous evaluation last week. Unfortunately this seems to be training in a pretty poor direction. Her toe was actually now starting to angle laterally and I can actually see the entire joint area of the proximal portion of the digit where is the distal portion of the digit again is no longer even in contact with the joint line. Unfortunately  there's a lot more necrotic tissue around the edge and the toe appears to be showing signs of becoming gangrenous in my pinion. I'm very concerned about were things stand at this point. She did see infectious disease and they are planning to send in a prescription for Sivextro for  her and apparently this has been approved. With that being said I don't think she should avoid taking this but at the same time I'm not sure that it's gonna be sufficient to save her toe at this point. 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 03/05/19 on evaluation today patient actually appears to be doing quite well in regard to her toe all things considering. She still has bone exposed but there appears to be much less your thing on overall the appearance of the wound and the toe itself is dramatically improved. She still does have some issues currently obviously with infection she did see vascular as well and there concerned that her blood flow to the toad. For that reason they are setting up for an angiogram next week. 03/14/19 on evaluation today patient appears to be doing very poor in regard to her toe and specifically in regard to the ulceration and the fact that she's starting to notice the toe was leaning even more towards the lateral aspect and the complete joint is visible on the proximal aspect of the joint. Nonetheless she's also noted a significant odor and the tip of the toe is turning more dark and necrotic appearing. Overall I think she is getting worse not better as far as this is concerned. For that reason I am recommending at this point that she likely needs to be seen for likely amputation. OHEMAA, COONER ([id number]) READMISSION 03/19/2021 This is a patient that we cared for in this clinic for a  prolonged period of time in 2019 and 2020 with a left foot and left first toe wound. 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 Luana Shu. In December 2021 she had an ulcer on her right great toe as well as the fourth and fifth toes. 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 She also had an angiogram at that time and underwent angioplasty of the right anterior tibial artery. In any case she claims that the wound on the right foot is closed I did not look at this today which was probably an oversight although I think that should be done next week. After her surgery she developed a dehiscence but I do not see any follow-up of this. According to Dr. Deborra Medina last review that she was out of the area being cared for by another physician but recently came back to his attention. 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 A culture of this area showed E. coli apparently before she came back to see Dr. Luana Shu she was supposed to be receiving antibiotics but she did not really take them. Nor is she offloading this area at all. Finally her last hemoglobin A1c listed in epic was in March 2022 at 14.1 she says things are a lot better since then although I am not sure. 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 She had this wound at the time. During this admission she had E. coli sepsis a TEE was negative. Past medical history is extensive and includes type 2 diabetes with peripheral neuropathy cardiomyopathy with  an ejection fraction of 33%, hypertension, hyperlipidemia chronic renal failure stage III history of substance abuse with cocaine although she claims to be clean now verified by her mother. She is still a heavy cigarette smoker. She has a history of bipolar  disorder seizure disorder ABI in our clinic was 1.05 6/1; left midfoot in the setting of a TMA done previously. Round circular wound with a "knuckle" of protruding tissue. The problem is that the knuckle was not attached to any of the surrounding granulation and this probed proximally widely I removed a large portion of this tissue. This wound goes with considerable undermining laterally. I do not feel any bone there was no purulence but this is a deep wound. 6/8; in spite of the debridement I did last week. She arrives with a wound looking exactly the same. A protruding "knuckle" of tissue nonadherent to most of the surrounding tissue. There is considerable depth around this from 6-12 o'clock at 2.7 cm and undermining of 1 cm. This does not look overtly infected and the x-ray I did last week was negative for any osseous abnormalities. We have been using silver collagen 6/15; deep tissue culture I did last week showed moderate staph aureus and moderate Pseudomonas. This will definitely require prolonged antibiotic therapy. The pathology on the protuberant area was negative for malignancy fungus etc. 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 We have been using silver collagen. I am going to be prescribing Levaquin for 2 weeks. Her CT scan of the foot is down for 7/5 6/22; CT scan of the foot on 7 5. She says she has hardware in the left leg from her previous fracture. She is on the Levaquin for the deep tissue culture I did that showed methicillin sensitive staph aureus and Pseudomonas. I gave her a 2-week supply and she will have another week. She arrives in clinic today with the same protuberant tissue however this is nonadherent to the tissue surrounding it. I am really at a loss to explain this unless there is underlying deep tissue infection 6/29; patient presents for 1 week follow-up. She has been using collagen to the wound bed. She  reports taking her antibiotics as prescribed.She has no complaints or issues today. She denies signs of infection. Patient History Information obtained from Patient. Social History Current every day smoker, Marital Status - Single, Alcohol Use - Never, Drug Use - Prior History, Caffeine Use - Daily. Medical History Ear/Nose/Mouth/Throat Patient has history of Chronic sinus problems/congestion, Middle ear problems Hematologic/Lymphatic Patient has history of Anemia Respiratory Patient has history of Chronic Obstructive Pulmonary Disease (COPD) Cardiovascular Patient has history of Congestive Heart Failure - EF 33% Endocrine Patient has history of Type II Diabetes Genitourinary Patient has history of End Stage Renal Disease Integumentary (Skin) Patient has history of History of pressure wounds Neurologic Patient has history of Neuropathy - feel and legs Objective Tolen, Lonni A. ([id number]) Constitutional Vitals Time Taken: 12:50 PM, Height: 69 in, Weight: 185 lbs, BMI: 27.3, Temperature: 98.9 F, Pulse: 77 bpm, Respiratory Rate: 16 breaths/min, Blood Pressure: 98/64 mmHg. General Notes: Left foot: Wound to the plantar aspect that that has some granulation tissue and nonviable tissue present.. Most of the tissue is pale tissue and overall does not look very healthy. There is significant callus circumferentially and maceration noted. No surrounding signs of infection Integumentary (Hair, Skin) Wound #7 status is Open. Original cause of wound was Gradually Appeared. The date acquired was: 08/07/2020. The  wound has been in treatment 4 weeks. The wound is located on the [place]. The wound measures 2cm length x 1.9cm width x 1.2cm depth; 2.985cm^2 area and 3.581cm^3 volume. There is Fat Layer (Subcutaneous Tissue) exposed. Tunneling has been noted at 9:00 with a maximum distance of 0.9cm. Undermining begins at 12:00 and ends at 12:00 with a maximum distance of 0.3cm.  There is a large amount of serosanguineous drainage noted. The wound margin is epibole. There is medium (34-66%) pink granulation within the wound bed. There is a medium (34-66%) amount of necrotic tissue within the wound bed including Adherent Slough. General Notes: Skin macerated around wound Assessment Active Problems ICD-10 Type 2 diabetes mellitus with foot ulcer 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ity Type 2 diabetes mellitus with diabetic polyneuropathy Patient's wound is overall stable. She is still taking Levaquin. No obvious signs of infection. Because of maceration noted I recommended using zinc oxide around the periwound. Also I recommended an absorbent pad however patient states she cannot afford this. We can continue collagen to this and I may switch to an alginate next visit. She is scheduled for her CT scan on 7/5. We will see her back in a week Procedures Wound #7 Pre-procedure diagnosis of Wound #7 is a Pressure Ulcer located on the Left,Midline,Plantar Foot . There was a Excisional Skin/Subcutaneous Tissue Debridement with a total area of 3.8 sq cm performed by Kalman Shan, MD. With the following instrument(s): Curette to remove Viable and Non-Viable tissue/material. Material removed includes Callus and Subcutaneous Tissue and. No specimens were taken. A time out was conducted at 13:30, prior to the start of the procedure. A Minimum amount of bleeding was controlled with Pressure. The procedure was tolerated well. Post Debridement Measurements: 2cm length x 1.9cm width x 1.2cm depth; 3.581cm^3 volume. Post debridement Stage noted as Category/Stage II. Character of Wound/Ulcer Post Debridement is stable. Post procedure Diagnosis Wound #7: Same as Pre-Procedure Plan Follow-up Appointments: Return Appointment in 1 week. Bathing/ Shower/ Hygiene: Clean wound with Normal Saline or wound cleanser. Off-Loading: Open toe  surgical shoe with peg assist. Medications-Please add to medication list.: P.O. Antibiotics - Levaquin WOUND #7: - Foot Wound Laterality: Plantar, Left, Midline Cleanser: Soap and Water 3 x Per Week/30 Days Discharge Instructions: Gently cleanse wound with antibacterial soap, rinse and pat dry prior to dressing wounds Peri-Wound Care: Desitin Maximum Strength Ointment 4 (oz) 3 x Per Week/30 Days Primary Dressing: Prisma 4.34 (in) 3 x Per Week/30 Days Discharge Instructions: moisten with saline-tuck in undermining area Mirsky, Kaziah A. ([id number]) Secondary Dressing: Gauze 3 x Per Week/30 Days Discharge Instructions: As directed: dry, moistened with saline or moistened with Dakins Solution Secured With: [employer] Sterile or Non-Sterile 6-ply 4.5x4 (yd/yd) 3 x Per Week/30 Days Discharge Instructions: Apply Kerlix as directed 1. Collagen 2. Zinc oxide 3. Follow-up in 1 week Electronic Signature(s) Signed: 04/21/2021 1:46:11 PM By: Kalman Shan DO Entered By: Kalman Shan on 04/21/2021 13:44:37 [name] ([id number]) -------------------------------------------------------------------------------- ROS/PFSH Details Patient Name: Heather Dear A. Date of Service: 04/21/2021 12:45 PM Medical Record Number: [id number] Patient Account Number: [id number] Date of Birth/Sex: 11-24-1975 (45 y.o. F) Treating RN: Cornell Barman Primary Care Provider: Raelene Bott Other Clinician: Referring Provider: Raelene Bott Treating Provider/Extender: Yaakov Guthrie in Treatment: 4 Information Obtained From Patient Ear/Nose/Mouth/Throat Medical History: Positive for: Chronic sinus problems/congestion; Middle ear problems Hematologic/Lymphatic Medical History: Positive for: Anemia Respiratory Medical History: Positive for: Chronic Obstructive Pulmonary Disease (COPD)  Cardiovascular Medical History: Positive for: Congestive Heart Failure - EF 33% Endocrine Medical  History: Positive for: Type II Diabetes Time with diabetes: 14 years Treated with: Insulin, Oral agents Blood sugar tested every day: No Blood sugar testing results: Bedtime: 176 Genitourinary Medical History: Positive for: End Stage Renal Disease Integumentary (Skin) Medical History: Positive for: History of pressure wounds Neurologic Medical History: Positive for: Neuropathy - feel and legs HBO Extended History Items Ear/Nose/Mouth/Throat: Ear/Nose/Mouth/Throat: Chronic sinus problems/congestion Middle ear problems Immunizations Pneumococcal Vaccine: Received Pneumococcal Vaccination: No Gladwell, Kemesha A. ([id number]) Implantable Devices No devices added Family and Social History Current every day smoker; Marital Status - Single; Alcohol Use: Never; Drug Use: Prior History; Caffeine Use: Daily Electronic Signature(s) Signed: 04/21/2021 1:46:11 PM By: Kalman Shan DO Signed: 05/10/2021 1:36:15 PM By: Gretta Cool, BSN, RN, CWS, [name] RN, BSN Entered By: Kalman Shan on 04/21/2021 13:39:25 [name] ([id number]) -------------------------------------------------------------------------------- [place] Details Patient Name: Heather Dear A. Date of Service: 04/21/2021 Medical Record Number: [id number] Patient Account Number: [id number] Date of Birth/Sex: January 10, 1976 (45 y.o. F) Treating RN: Cornell Barman Primary Care Provider: Raelene Bott Other Clinician: Referring Provider: Raelene Bott Treating Provider/Extender: Yaakov Guthrie in Treatment: 4 Diagnosis Coding ICD-10 Codes Code Description E11.621 Type 2 diabetes mellitus with foot ulcer 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ity E11.42 Type 2 diabetes mellitus with diabetic polyneuropathy Facility Procedures CPT4 Code: [id number] Description: [id number] - DEB SUBQ TISSUE 20 SQ CM/< Modifier: Quantity: 1 CPT4 Code: Description: ICD-10 Diagnosis Description  E11.621 Type 2 diabetes mellitus with foot ulcer 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 E11.42 Type 2 diabetes mellitus with diabetic polyneuropathy Modifier: severity Quantity: Physician Procedures CPT4 Code: [id number] Description: 11042 - WC PHYS SUBQ TISS 20 SQ CM Modifier: Quantity: 1 CPT4 Code: Description: ICD-10 Diagnosis Description E11.621 Type 2 diabetes mellitus with foot ulcer 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 E11.42 Type 2 diabetes mellitus with diabetic polyneuropathy Modifier: severity Quantity: Electronic Signature(s) Signed: 04/21/2021 2:35:33 PM By: Kalman Shan DO Previous Signature: 04/21/2021 1:46:11 PM Version By: Kalman Shan DO Entered By: Kalman Shan on 04/21/2021 14:35:04

## 2021-05-10 NOTE — Progress Notes (Signed)
[name], [name] ([id number]) Visit Report for 04/21/2021 Arrival Information Details Patient Name: [name], [name] Date of Service: 04/21/2021 12:45 PM Medical Record Number: [id number] Patient Account Number: [id number] Date of Birth/Sex: 07-25-1976 (45 y.o. F) Treating RN: [name] Primary Care [name]: [name] Other Clinician: Referring [name]: [name] Treating [name]: [name] in Treatment: 4 Visit Information History Since Last Visit Added or deleted any medications: No Patient Arrived: Wheel Chair Had a fall or experienced change in No Arrival Time: 12:47 activities of daily living that may affect Accompanied By: mother risk of falls: Transfer Assistance: None Hospitalized since last visit: No Patient Identification Verified: Yes Has Dressing in Place as Prescribed: Yes Secondary Verification Process Completed: Yes Pain Present Now: Yes Patient Requires Transmission-Based No Precautions: Patient Has Alerts: Yes Patient Alerts: Patient on Blood Thinner '81mg'$  apirin Diabetic Type II Electronic Signature(s) Signed: 04/21/2021 3:28:20 PM By: [name] Entered By: [name] on 04/21/2021 12:50:22 [name] ([id number]) -------------------------------------------------------------------------------- Encounter Discharge Information Details Patient Name: Heather Dear A. Date of Service: 04/21/2021 12:45 PM Medical Record Number: [id number] Patient Account Number: [id number] Date of Birth/Sex: [birth year] (45 y.o. F) Treating RN: [name] Primary Care Rashunda Passon: [name] Other Clinician: Referring [name]: [name] Treating [name]: [name] in Treatment: 4 Encounter Discharge Information Items Post Procedure Vitals Discharge Condition: Stable Temperature (F): 98.9 Ambulatory Status: Wheelchair Pulse (bpm): 77 Discharge Destination: Home Respiratory Rate (breaths/min):  16 Transportation: Private Auto Blood Pressure (mmHg): 98/64 Accompanied By: self Schedule Follow-up Appointment: Yes Clinical Summary of Care: Patient Declined Electronic Signature(s) Signed: 04/22/2021 6:25:03 PM By: Heather Coria RN Entered By: [name] on 04/21/2021 13:47:36 [name], [name] ([id number]) -------------------------------------------------------------------------------- Lower Extremity Assessment Details Patient Name: Heather Dear A. Date of Service: 04/21/2021 12:45 PM Medical Record Number: [id number] Patient Account Number: [id number] Date of Birth/Sex: May 21, 1976 (45 y.o. F) Treating RN: [name] Primary Care Kacie Huxtable: [name] Other Clinician: Referring [name]: [name] Treating [name]: [name] in Treatment: 4 Edema Assessment Assessed: [Left: Yes] [Right: No] Edema: [Left: N] [Right: o] Electronic Signature(s) Signed: 04/21/2021 3:28:20 PM By: [name] Entered By: [name] on 04/21/2021 13:00:39 [name], [name] ([id number]) -------------------------------------------------------------------------------- Multi Wound Chart Details Patient Name: Heather Dear A. Date of Service: 04/21/2021 12:45 PM Medical Record Number: [id number] Patient Account Number: [id number] Date of Birth/Sex: 11/11/1975 (45 y.o. F) Treating RN: [name] Primary Care [name]: [name] Other Clinician: Referring Jalei Shibley: [name] Treating [name]: [name] in Treatment: 4 Vital Signs Height(in): 69 Pulse(bpm): 63 Weight(lbs): 185 Blood Pressure(mmHg): 98/64 Body Mass Index(BMI): 27 Temperature(F): 98.9 Respiratory Rate(breaths/min): 16 Photos: [N/A:N/A] Wound Location: Left, Midline, Plantar Foot N/A N/A Wounding Event: Gradually Appeared N/A N/A Primary Etiology: Pressure Ulcer N/A N/A Comorbid History: Chronic sinus problems/congestion, N/A N/A Middle ear problems,  Anemia, Chronic Obstructive Pulmonary Disease (COPD), Congestive Heart Failure, Type II Diabetes, End Stage Renal Disease, History of pressure wounds, Neuropathy Date Acquired: 08/07/2020 N/A N/A Weeks of Treatment: 4 N/A N/A Wound Status: Open N/A N/A Measurements L x W x D (cm) 2x1.9x1.2 N/A N/A Area (cm) : 2.985 N/A N/A Volume (cm) : 3.581 N/A N/A % Reduction in Area: -11.80% N/A N/A % Reduction in Volume: -168.20% N/A N/A Position 1 (o'clock): 9 Maximum Distance 1 (cm): 0.9 Starting Position 1 (o'clock): 12 Ending Position 1 (o'clock): 12 Maximum Distance 1 (cm): 0.3 Tunneling: Yes N/A N/A Undermining: Yes N/A N/A Classification: Category/Stage II N/A N/A Exudate Amount: Large N/A N/A Exudate Type:  Serosanguineous N/A N/A Exudate Color: red, brown N/A N/A Wound Margin: Epibole N/A N/A Granulation Amount: Medium (34-66%) N/A N/A Granulation Quality: Pink N/A N/A Necrotic Amount: Medium (34-66%) N/A N/A Exposed Structures: Fat Layer (Subcutaneous Tissue): N/A N/A Yes Fascia: No Tendon: No Muscle: No Joint: No Bone: No Epithelialization: None N/A N/A Debridement: Debridement - Excisional N/A N/A 13:30 N/A N/A [name] ([id number]) Pre-procedure Verification/Time Out Taken: Tissue Debrided: Callus, Subcutaneous N/A N/A Level: Skin/Subcutaneous Tissue N/A N/A Debridement Area (sq cm): 3.8 N/A N/A Instrument: Curette N/A N/A Bleeding: Minimum N/A N/A Hemostasis Achieved: Pressure N/A N/A Debridement Treatment Procedure was tolerated well N/A N/A Response: Post Debridement 2x1.9x1.2 N/A N/A Measurements L x W x D (cm) Post Debridement Volume: 3.581 N/A N/A (cm) Post Debridement Stage: Category/Stage II N/A N/A Assessment Notes: Skin macerated around wound N/A N/A Procedures Performed: Debridement N/A N/A Treatment Notes Electronic Signature(s) Signed: 04/21/2021 1:46:11 PM By: Heather Shan DO Entered By: [name] on 04/21/2021  13:36:16 [name] ([id number]) -------------------------------------------------------------------------------- [place] Details Patient Name: Heather Dear A. Date of Service: 04/21/2021 12:45 PM Medical Record Number: [id number] Patient Account Number: [id number] Date of Birth/Sex: September 17, 1976 (45 y.o. F) Treating RN: [name] Primary Care Montell Leopard: [name] Other Clinician: Referring Kayna Suppa: Raelene [name] Treating Daxton Nydam/Extender: [name] in Treatment: 4 Active Inactive Abuse / Safety / Falls / Self Care Management Nursing Diagnoses: Potential for injury related to falls Goals: Patient/caregiver will verbalize understanding of skin care regimen Date Initiated: 03/19/2021 Target Resolution Date: 04/19/2021 Goal Status: Active Patient/caregiver will verbalize/demonstrate measures taken to prevent injury and/or falls Date Initiated: 03/19/2021 Target Resolution Date: 04/19/2021 Goal Status: Active Interventions: Assess Activities of Daily Living upon admission and as needed Assess fall risk on admission and as needed Assess: immobility, friction, shearing, incontinence upon admission and as needed Assess impairment of mobility on admission and as needed per policy Assess personal safety and home safety (as indicated) on admission and as needed Assess self care needs on admission and as needed Notes: Necrotic Tissue Nursing Diagnoses: Impaired tissue integrity related to necrotic/devitalized tissue Knowledge deficit related to management of necrotic/devitalized tissue Goals: Necrotic/devitalized tissue will be minimized in the wound bed Date Initiated: 04/07/2021 Target Resolution Date: 04/07/2021 Goal Status: Active Patient/caregiver will verbalize understanding of reason and process for debridement of necrotic tissue Date Initiated: 04/07/2021 Target Resolution Date: 04/07/2021 Goal Status: Active Interventions: Assess  patient pain level pre-, during and post procedure and prior to discharge Provide education on necrotic tissue and debridement process Treatment Activities: Excisional debridement : 04/07/2021 Notes: Wound/Skin Impairment Nursing Diagnoses: Knowledge deficit related to ulceration/compromised skin integrity Goals: Patient/caregiver will verbalize understanding of skin care regimen LANAY, BATTIE ([id number]) Date Initiated: 03/19/2021 Target Resolution Date: 04/19/2021 Goal Status: Active Ulcer/skin breakdown will have a volume reduction of 30% by week 4 Date Initiated: 03/19/2021 Target Resolution Date: 04/19/2021 Goal Status: Active Ulcer/skin breakdown will have a volume reduction of 50% by week 8 Date Initiated: 03/19/2021 Target Resolution Date: 05/19/2021 Goal Status: Active Ulcer/skin breakdown will have a volume reduction of 80% by week 12 Date Initiated: 03/19/2021 Target Resolution Date: 06/19/2021 Goal Status: Active Ulcer/skin breakdown will heal within 14 weeks Date Initiated: 03/19/2021 Target Resolution Date: 07/20/2021 Goal Status: Active Interventions: Assess patient/caregiver ability to obtain necessary supplies Assess patient/caregiver ability to perform ulcer/skin care regimen upon admission and as needed Assess ulceration(s) every visit Notes: Electronic Signature(s) Signed: 05/10/2021 1:36:15 PM By: Gretta Cool, BSN, RN, CWS, [name] RN, BSN Entered By:  Gretta Cool, BSN, RN, CWS, [name] on 04/21/2021 13:29:56 [name], [name] Kitchen ([id number]) -------------------------------------------------------------------------------- Pain Assessment Details Patient Name: [name], [name] Date of Service: 04/21/2021 12:45 PM Medical Record Number: [id number] Patient Account Number: [id number] Date of Birth/Sex: 1976/08/02 (45 y.o. F) Treating RN: [name] Primary Care Laura Radilla: [name] Other Clinician: Referring Tyann Niehaus: [name] Treating Jerian Morais/Extender: [name] in Treatment: 4 Active Problems Location of Pain Severity and Description of Pain Patient Has Paino Yes Site Locations Pain Location: Generalized Pain Rate the pain. Current Pain Level: 8 Pain Management and Medication Current Pain Management: Electronic Signature(s) Signed: 04/21/2021 3:28:20 PM By: [name] Entered By: [name] on 04/21/2021 12:54:51 [name] ([id number]) -------------------------------------------------------------------------------- Patient/Caregiver Education Details Patient Name: Heather Dear A. Date of Service: 04/21/2021 12:45 PM Medical Record Number: [id number] Patient Account Number: [id number] Date of Birth/Gender: August 15, 1976 (45 y.o. F) Treating RN: [name] Primary Care Physician: [name] Other Clinician: Referring Physician: Raelene [name] Treating Physician/Extender: [name] in Treatment: 4 Education Assessment Education Provided To: Patient Education Topics Provided Wound Debridement: Handouts: Wound Debridement Methods: Demonstration, Explain/Verbal Responses: State content correctly Electronic Signature(s) Signed: 05/10/2021 1:36:15 PM By: Gretta Cool, BSN, RN, CWS, [name] RN, BSN Entered By: Gretta Cool, BSN, RN, CWS, [name] on 04/21/2021 13:36:25 [name] ([id number]) -------------------------------------------------------------------------------- Wound Assessment Details Patient Name: Heather Dear A. Date of Service: 04/21/2021 12:45 PM Medical Record Number: [id number] Patient Account Number: [id number] Date of Birth/Sex: [birth year] (45 y.o. F) Treating RN: [name] Primary Care Penelopi Mikrut: [name] Other Clinician: Referring Login Muckleroy: [name] Treating Melvena Vink/Extender: [name] in Treatment: 4 Wound Status Wound Number: 7 Primary Pressure Ulcer Etiology: Wound Location: Left, Midline, Plantar Foot Wound Open Wounding Event: Gradually  Appeared Status: Date Acquired: 08/07/2020 Comorbid Chronic sinus problems/congestion, Middle ear problems, Weeks Of Treatment: 4 History: Anemia, Chronic Obstructive Pulmonary Disease (COPD), Clustered Wound: No Congestive Heart Failure, Type II Diabetes, End Stage Renal Disease, History of pressure wounds, Neuropathy Photos Wound Measurements Length: (cm) 2 % Width: (cm) 1.9 % Depth: (cm) 1.2 E Area: (cm) 2.985 Volume: (cm) 3.581 Reduction in Area: -11.8% Reduction in Volume: -168.2% pithelialization: None Tunneling: Yes Position (o'clock): 9 Maximum Distance: (cm) 0.9 Undermining: Yes Starting Position (o'clock): 12 Ending Position (o'clock): 12 Maximum Distance: (cm) 0.3 Wound Description Classification: Category/Stage II F Wound Margin: Epibole S Exudate Amount: Large Exudate Type: Serosanguineous Exudate Color: red, brown oul Odor After Cleansing: No lough/Fibrino Yes Wound Bed Granulation Amount: Medium (34-66%) Exposed Structure Granulation Quality: Pink Fascia Exposed: No Necrotic Amount: Medium (34-66%) Fat Layer (Subcutaneous Tissue) Exposed: Yes Necrotic Quality: Adherent Slough Tendon Exposed: No Muscle Exposed: No Joint Exposed: No Bone Exposed: No [name], [name] ([id number]) Assessment Notes Skin macerated around wound Electronic Signature(s) Signed: 04/21/2021 3:28:20 PM By: [name] Signed: 05/10/2021 1:36:15 PM By: Gretta Cool, BSN, RN, CWS, [name] RN, BSN Entered By: Gretta Cool, BSN, RN, CWS, [name] on 04/21/2021 13:33:56 [name] ([id number]) -------------------------------------------------------------------------------- [place] Details Patient Name: Heather Dear A. Date of Service: 04/21/2021 12:45 PM Medical Record Number: [id number] Patient Account Number: [id number] Date of Birth/Sex: Jul 16, 1976 (45 y.o. F) Treating RN: [name] Primary Care Threasa Kinch: [name] Other Clinician: Referring Adilenne Ashworth: [name] Treating  Chinmayi Rumer/Extender: [name] in Treatment: 4 Vital Signs Time Taken: 12:50 Temperature (F): 98.9 Height (in): 69 Pulse (bpm): 77 Weight (lbs): 185 Respiratory Rate (breaths/min): 16 Body Mass Index (BMI): 27.3 Blood Pressure (mmHg): 98/64 Reference Range: 80 - 120 mg / dl Electronic Signature(s) Signed: 04/21/2021 3:28:20 PM  By: [name] Entered ByDonnamarie [name] on 04/21/2021 12:54:19

## 2021-05-12 ENCOUNTER — Other Ambulatory Visit: Payer: Self-pay

## 2021-05-12 ENCOUNTER — Encounter (HOSPITAL_BASED_OUTPATIENT_CLINIC_OR_DEPARTMENT_OTHER): Payer: Medicaid Other | Admitting: Internal Medicine

## 2021-05-12 DIAGNOSIS — L97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ity: Secondary | ICD-10-CM

## 2021-05-12 DIAGNOSIS — E11621 Type 2 diabetes mellitus with foot ulcer: Secondary | ICD-10-CM | POA: Diagnosis not present

## 2021-05-13 NOTE — Progress Notes (Signed)
JAIME, VANWIEREN ([id number]) Visit Report for 05/12/2021 Chief Complaint Document Details Patient Name: [name], [name] Date of Service: 05/12/2021 1:00 PM Medical Record Number: [id number] Patient Account Number: [id number] Date of Birth/Sex: 09/20/1976 (45 y.o. F) Treating RN: Cornell Barman Primary Care Provider: Raelene Bott Other Clinician: Referring Provider: Raelene Bott Treating Provider/Extender: Yaakov Guthrie in Treatment: 7 Information Obtained from: Patient Chief Complaint Left great toe ulcer 03/19/2021; patient referred by Dr. Luana Shu who has been looking after her left foot for quite a period of time for review of a nonhealing area in the left midfoot Electronic Signature(s) Signed: 05/12/2021 2:25:26 PM By: Kalman Shan DO Entered By: Kalman Shan on 05/12/2021 14:05:27 [name] ([id number]) -------------------------------------------------------------------------------- Debridement Details Patient Name: Heather Dear A. Date of Service: 05/12/2021 1:00 PM Medical Record Number: [id number] Patient Account Number: [id number] Date of Birth/Sex: [birth year] (45 y.o. F) Treating RN: Dolan Amen Primary Care Provider: Raelene Bott Other Clinician: Referring Provider: Raelene Bott Treating Provider/Extender: Yaakov Guthrie in Treatment: 7 Debridement Performed for Wound #7 Left,Midline,Plantar Foot Assessment: Performed By: Physician Kalman Shan, MD Debridement Type: Debridement Level of Consciousness (Pre- Awake and Alert procedure): Pre-procedure Verification/Time Out Yes - 16:40 Taken: Start Time: 16:40 Total Area Debrided (L x W): 1.8 (cm) x 1.8 (cm) = 3.24 (cm) Tissue and other material Viable, Non-Viable, Slough, Subcutaneous, Slough debrided: Level: Skin/Subcutaneous Tissue Debridement Description: Excisional Instrument: Curette Bleeding: Minimum Hemostasis Achieved: Pressure Response to Treatment: Procedure  was tolerated well Level of Consciousness (Post- Awake and Alert procedure): Post Debridement Measurements of Total Wound Length: (cm) 1.8 Stage: Category/Stage II Width: (cm) 1.8 Depth: (cm) 1.1 Volume: (cm) 2.799 Character of Wound/Ulcer Post Debridement: Stable Post Procedure Diagnosis Same as Pre-procedure Electronic Signature(s) Signed: 05/12/2021 2:25:26 PM By: Kalman Shan DO Signed: 05/12/2021 4:29:14 PM By: Dolan Amen RN Entered By: Dolan Amen on 05/12/2021 13:48:44 [name], [name] ([id number]) -------------------------------------------------------------------------------- HPI Details Patient Name: Heather Dear A. Date of Service: 05/12/2021 1:00 PM Medical Record Number: [id number] Patient Account Number: [id number] Date of Birth/Sex: 1976/01/12 (45 y.o. F) Treating RN: Cornell Barman Primary Care Provider: Raelene Bott Other Clinician: Referring Provider: Raelene Bott Treating Provider/Extender: Yaakov Guthrie in Treatment: 7 History of Present Illness HPI Description: 01/18/18-She is here for initial evaluation of the left great toe ulcer. She is a poor historian in regards to timeframe in detail. She states approximately 4 weeks ago she lacerated her toe on something in the house. She followed up with her primary care who placed her on Bactrim and ultimately a second dose of Bactrim prior to coming to wound clinic. She states she has been treating the toe with peroxide, Betadine and a Band-Aid. She did not check her blood sugar this morning but checked it yesterday morning it was 327; she is unaware of a recent A1c and there are no current records. She saw Dr. she would've orthopedics last week for an old injury to the left ankle, she states he did not see her toe, nor did she bring it to his attention. She smokes approximately 1 pack cigarettes a day. Her social situation is concerning, she arrives this morning with her mother who appears  extremely intoxicated/under the influence; her mother was asked to leave the room and be monitored by the patient's grandmother. The patient's aunt then accompanied the patient and the room throughout the rest of the appointment. We had a lengthy discussion regarding the deleterious effects of uncontrolled hyperglycemia and smoking as it relates to wound  healing and overall health. She was strongly encouraged to decrease her smoking and get her diabetes under better control. She states she is currently on a diet and has cut down her [facility] consumption. The left toe is erythematous, macerated and slightly edematous with malodor present. The edema in her left foot is below her baseline, there is no erythema streaking. We will treat her with Santyl, doxycycline; we have ordered and xray, culture and provided a Peg assist surgical shoe and cultured the wound. 01/25/18-She is here in follow-up evaluation for a left great toe ulcer and presents with an abscess to her suprapubic area. She states her blood sugars remain elevated, feeling "sick" and if levels are below 250, but she is trying. She has made no attempt to decrease her smoking stating that we "can't take away her food in her cigarettes". She has been compliant with offloading using the PEG assist you. She is using Santyl daily. the culture obtained last week grew staph aureus and Enterococcus faecalis; continues on the doxycycline and Augmentin was added on Monday. The suprapubic area has erythema, no femoral variation, purple discoloration, minimal induration, was accessed with a cotton tip applicator with sanguinopurulent drainage, this was cultured, I suspect the current antibiotic treatment will cover and we will not add anything to her current treatment plan. She was advised to go to urgent care or ER with any change in redness, induration or fever. 02/01/18-She is here in follow-up evaluation for left great toe ulcers and a new abdominal  abscess from last week. She was able to use packing until earlier this week, where she "forgot it was there". She states she was feeling ill with GI symptoms last week and was not taking her antibiotic. She states her glucose levels have been predominantly less than 200, with occasional levels between 200-250. She thinks this was contributing to her GI symptoms as they have resolved without intervention. There continues to be significant laceration to left toe, otherwise it clinically looks stable/improved. There is now less superficial opening to the lateral aspect of the great toe that was residual blister. We will transition to [facility] to all wounds, she will continue her Augmentin. If there is no change or deterioration next week for reculture. 02/08/18-She is here in follow-up evaluation for left great toe ulcer and abdominal ulcer. There is an improvement in both wounds. She has been wrapping her left toe with coban, not by our direction, which has created an area of discoloration to the medial aspect; she has been advised to NOT use coban secondary to her neuropathy. She states her glucose levels have been high over this last week ranging from 200-350, she continues to smoke. She admits to being less compliant with her offloading shoe. We will continue with same treatment plan and she will follow-up next week. 02/15/18-She is here in follow-up evaluation for left great toe ulcer and abdominal ulcer. The abdominal ulcer is epithelialized. The left great toe ulcer is improved and all injury from last week using the Coban wrap is resolved, the lateral ulcer is healed. She admits to noncompliance with wearing offloading shoe and admits to glucose levels being greater than 300 most of the week. She continues to smoke and expresses no desire to quit. There is one area medially that probes deeper than it has historically, erythema to the toe and dorsal foot has consistently waxed and waned. There  is no overt signs of cellulitis or infection but we will culture the wound for  any occult infection given the new area of depth and erythema. We will hold off on sensitivities for initiation of antibiotic therapy. 02/22/18-She is here in follow up evaluation for left great toe ulcer. There is overall significant improvement in both wound appearance, erythema and edema with changes made last week. She was not initiated on antibiotic therapy. Culture obtained last week showed oxacillin sensitive staph aureus, sensitive to clindamycin. Clindamycin has been called into the pharmacy but she has been instructed to hold off on initiation secondary to overall clinical improvement and her history of antibiotic intolerance. She has been instructed to contact the clinic with any noted changes/deterioration and the wound, erythema, edema and/or pain. She will follow-up next week. She continues to smoke and her glucose levels remain elevated >250; she admits to compliance with offloading shoe 03/01/18 on evaluation today patient appears to be doing fairly well in regard to her left first toe ulcer. She has been tolerating the dressing changes with the [facility] Dressing without complication and overall this has definitely showed signs of improvement according to records as well is what the patient tells me today. I'm very pleased in that regard. She is having no pain today 03/08/18 She is here for follow up evaluation of a left great toe ulcer. She remains non-compliant with glucose control and smoking cessation; glucose levels consistently >200. She states that she got new shoe inserts/peg assist. She admits to compliance with offloading. Since my last evaluation there is significant improvement. We will switch to prisma at this time and she will follow up next week. She is noted to be tachycardic at this appointment, heart rate 120s; she has a history of heart rate 70-130 according to our records. She admits to  extreme agitation r/t personal issues; she was advised to monitor her heartrate and contact her physician if it does not return to a more normal range (<100). She takes cardizem twice daily. 03/15/18-She is here in follow-up evaluation for left great toe ulcer. She remains noncompliant with glucose control and smoking cessation. She admits to compliance with wearing offloading shoe. The ulcer is improved/stable and we will continue with the same treatment plan and she will follow-up next week 03/22/18-She is here for evaluation for left great toe ulcer. There continues to be significant improvement despite recurrent hyperglycemia (over 500 yesterday) and she continues to smoke. She has been compliant with offloading and we will continue with same treatment plan and she will follow-up next week. 03/29/18-She is here for evaluation for left great toe ulcer. Despite continuing to smoke and uncontrolled diabetes she continues to improve. She is compliant with offloading shoe. We will continue with the same treatment plan and she will follow-up next week 04/05/18- She is here in follow up evaluation for a left great toe ulcer; she presents with small pustule to left fifth toe (resembles ant bite). She admits to compliance with wearing offloading shoe; continues to smoke or have uncontrolled blood glucose control. There is more callus than usual with evidence of bleeding; she denies known trauma. 04/12/18-She is here for evaluation of left great toe ulcer. Despite noncompliance with glycemic control and smoking she continues to make Ragle, Danyia A. ([id number]) improvement. She continues to wear offloading shoe. The pustule, that was identified last week, to the left fifth toe is resolved. She will follow-up in 2 weeks 05/03/18-she is seen in follow-up evaluation for a left great toe ulcer. She is compliant with offloading, otherwise noncompliant with glycemic control and smoking. She  has plateaued and there  is minimal improvement noted. We will transition to [facility], replaced the insert to her surgical shoe and she will follow-up in one week 05/10/18- She is here in follow up evaluation for a left great toe ulcer. It appears stable despite measurement change. We will continue with same treatment plan and follow up next week. 05/24/18-She is seen in follow-up evaluation for a left great toe ulcer. She remains compliant with offloading, has made significant improvement in her diet, decreasing the amount of sugar/soda. She said her recent A1c was 10.9 which is lower than. She did see a diabetic nutritionist/educator yesterday. She continues to smoke. We will continue with the same treatment plan and she'll follow-up next week. 05/31/18- She is seen in follow-up evaluation for left great toe ulcer. She continues to remain compliant with offloading, continues to make improvement in her diet, increasing her water and decreasing the amount of sugar/soda. She does continue to smoke with no desire to quit. We will apply Prisma to the depth and Hydrofera Blue over. We have not received insurance authorization for oasis. She will follow up next week. 06/07/18-She is seen in follow-up evaluation for left great toe ulcer. It has stalled according to today's measurements although base appears stable. She says she saw a diabetic educator yesterday; her average blood sugars are less than 300 which is an improvement for her. She continues to smoke and states "that's my next step" She continues with water over soda. We will order for xray, culture and reinstate ace wrap compression prior to placing apligraf for next week. She is voicing no complaints or concerns. Her dressing will change to iodoflex over the next week in preparation for apligraf. 06/14/18-She is seen in follow-up evaluation for left great toe ulcer. Plain film x-ray performed last week was negative for osteomyelitis. Wound culture obtained last week grew  strep B and OSSA; she is initiated on keflex and cefdinir today; there is erythema to the toe which could be from ace wrap compression, she has a history of wrapping too tight and has has been encouraged to maintain ace wraps that we place today. We will hold off on application of apligraf today, will apply next week after antibiotic therapy has been initiated. She admits today that she has resumed taking a shower with her foot/toe submerged in water, she has been reminded to keep foot/toe out of the bath water. She will be seen in follow up next week 06/21/18-she is seen in follow-up evaluation for left great toe ulcer. She is tolerating antibiotic therapy with no GI disturbance. The wound is stable. Apligraf was applied today. She has been decreasing her smoking, only had 4 cigarettes yesterday and 1 today. She continues being more compliant in diabetic diet. She will follow-up next week for evaluation of site, if stable will remove at 2 weeks. 06/28/18- She is here in follow up evalution. Apligraf was placed last week, she states the dressing fell off on Tuesday and she was dressing with hydrofera blue. She is healed and will be discharged from the clinic today. She has been instructed to continue with smoking cessation, continue monitoring glucose levels, offloading for an additional 4 weeks and continue with hydrofera blue for additional two weeks for any possible microscopic opening. Readmission: 08/07/18 on evaluation today patient presents for reevaluation concerning the ulcer of her right great toe. She was previously discharged on 06/28/18 healed. Nonetheless she states that this began to show signs of drainage she subsequently went  to her primary care provider. Subsequently an x-ray was performed on 08/01/18 which was negative. The patient was also placed on antibiotics at that time. Fortunately they should have been effective for the infection. Nonetheless she's been experiencing some  improvement but still has a lot of drainage coming from the wound itself. 08/14/18 on evaluation today patient's wound actually does show signs of improvement in regard to the erythema at this point. She has completed the antibiotics. With that being said we did discuss the possibility of placing her in a total contact cast as of today although I think that I may want to give this just a little bit more time to ensure nothing recurrence as far as her infection is concerned. I do not want to put in the cast and risk infection at that time if things are not completely resolved. With that being said she is gonna require some debridement today. 08/21/18 on evaluation today patient actually appears to be doing okay in regard to her toe ulcer. She's been tolerating the dressing changes without complication. With that being said it does appear that she is ready and in fact I think it's appropriate for [place] to go ahead and initiate the total contact cast today. Nonetheless she will require some sharp debridement to prepare the wound for application. Overall I feel like things have been progressing well but we do need to do something to get this to close more readily. 08/24/18 patient seen today for reevaluation after having had the total contact cast applied on Tuesday. She seems to have done very well the wound appears to be doing great and overall I'm pleased with the progress that she's made. There were no abnormal areas of rubbing from the cast on her lower extremity. 08/30/18 on evaluation today patient actually appears to be completely healed in regard to her plantar toe ulcer. She tells me at this point she's been having a lot of issues with the cast. She almost fell a couple of times the state shall the step of her dog a couple times as well. This is been a very frustrating process for her other nonetheless she has completely healed the wound which is excellent news. Overall there does not appear to be  the evidence of infection at this time which is great news. 09/11/18 evaluation today patient presents for follow-up concerning her great toe ulcer on the left which has unfortunately reopened since I last saw her which was only a couple of weeks ago. Unfortunately she was not able to get in to get the shoe and potentially the AFO that's gonna be necessary due to her left foot drop. She continues with offloading shoe but this is not enough to prevent her from reopening it appears. When we last had her in the total contact cast she did well from a healing standpoint but unfortunately the wound reopened as soon as she came out of the cast within just a couple of weeks. 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 This is also part of the reason why she falls on a regular basis. I just do not believe that is gonna be safe for [place] to reinitiate the total contact cast as last time we had this on she fell 3 times one week which  is definitely not normal for her. 09/18/18 upon evaluation today the patient actually appears to be doing about the same in regard to her toe ulcer. She did not contact [employer] as I asked her to even though I had given her the prescription. In fact she actually states that she has no idea where the prescription is. She did apparently call [employer] and they told her that all she needed to do was bring the prescription in order to be able to be seen and work on getting the AFO for her left foot. With all that being said she still does not have an appointment and I'm not sure were things stand that regard. I will give her a new prescription today in order to contact them to get this set up. 09/25/18 on evaluation today patient actually appears to be doing about the same in regard to her toes  ulcer. She does have a small areas which seems to have a lot of callous buildup around the edge of the wound which is going to need sharp debridement today. She still is waiting to be scheduled for evaluation with [employer] for possibility of an AFO. She states there supposed to call her tomorrow to get this set up. Unfortunately it does appear that her foot specifically the toe area is showing signs of erythema. There does not appear to be any systemic infection which is in these good news. TIEA, CHEUVRONT A. ([id number]) 10/02/18 on evaluation today patient actually appears to be doing about the same in regard to her toe ulcer. This really has not done too well although it's not significantly larger it's also not significantly smaller. She has been tolerating the dressing changes without complication. She actually has her appointment with [facility] and [place] tomorrow to hopefully be measured for obtaining and AFO splint. I think this would be helpful preventing this from reoccurring. We had contemplated starting the cast this week although to be honest I am reluctant to do that as she's been having nausea, vomiting, and seizure activity over the past three days. She has a history of seizures and have been told is nothing that can be done for these. With that being said I do believe that along with the seizures have the nausea vomiting which upon further questioning doesn't seem to be the normal for her and makes me concerned for the possibility of infection or something else going on. I discussed this with the patient and her mother during the office visit today. I do not feel the wound is effective but maybe something else. The responses this was "this just happens to her at times and we don't know why". They did not seem to be interested in going to the hospital to have this checked out further. 10/09/18 on evaluation today patient presents for follow-up concerning her ongoing toe ulcer. She has  been tolerating the dressing changes without complication. Fortunately there does not appear to be any evidence of infection which is great news however I do think that the patient would benefit from going ahead for with the total contact cast. She's actually in a wheelchair today she tells me that she will use her walker if we initiate the cast. I was very specific about the fact that if we were gonna do the cast I wanted to make sure that she was using the walker in order to prevent any falls. She tells me she does not have stairs that she has to traverse on a regular basis at her  home. She has not had any seizures since last week again that something that happens to her often she tells me she did talk to [employer] and they said that it may take up to three weeks to get the brace approved for her. Hopefully that will not take that long but nonetheless in the meantime I do think the cast could be of benefit. 10/12/18 on evaluation today patient appears to be doing rather well in regard to her toe ulcer. It's just been a few days and already this is significantly improved both as far as overall appearance and size. Fortunately there's no sign of infection. She is here for her first obligatory cast change. 10/19/18 Seen today for follow up and management of left great toe ulcer. Wound continues to show improvement. Noted small open area with seroussang drainage with palpation. Denies any increased pain or recent fevers during visit. She will continue calcium alginate with offloading shoe. Denies any questions or concerns during visit. 10/26/18 on evaluation today patient appears to be doing about the same as when I last saw her in regard to her wound bed. Fortunately there does not appear to be any signs of infection. Unfortunately she continues to have a breakdown in regard to the toe region any time that she is not in the cast. It takes almost no time at all for this to happen. Nonetheless she still has  not heard anything from the brace being made by [employer] as to when exactly this will be available to her. Fortunately there is no signs of infection at this time. 10/30/18 on evaluation today patient presents for application of the total contact cast as we just received him this morning. Fortunately we are gonna be able to apply this to her today which is great news. She continues to have no significant pain which is good news. Overall I do feel like things have been improving while she was the cast is when she doesn't have a cast that things get worse. She still has not really heard anything from [employer] regarding her brace. 11/02/18 upon evaluation today patient's wound already appears to be doing significantly better which is good news. Fortunately there does not appear to be any signs of infection also good news. Overall I do think the total contact cast as before is helping to heal this area unfortunately it's just not gonna likely keep the area closed and healed without her getting her brace at least. Again the foot drop is a significant issue for her. 11/09/18 on evaluation today patient appears to be doing excellent in regard to her toe ulcer which in fact is completely healed. Fortunately we finally got the situation squared away with the paperwork which was needed to proceed with getting her brace approved by Medicaid. I have filled that out unfortunately that information has been sent to the orthopedic office that I worked at 2 1/2 years ago and not tired Current wound care measures. Fortunately she seems to be doing very well at this time. 11/23/18 on evaluation today patient appears to be doing More Poorly Compared to Last Time I Saw Her. At [facility] She Had Completely Healed. Currently she is continuing to have issues with reopening. She states that she just found out that the brace was approved through Medicaid now she just has to go get measured in order to have this fitted for her and  then made. Subsequently she does not have an appointment for this yet that is going to complicate  things we obviously cannot put her back in the cast if we do not have everything measured because they're not gonna be able to measure her foot while she is in the cast. 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 Obviously this is unfortunate and does have me somewhat worried as well. 11/30/18 on evaluation today patient's toe ulcer actually appears to be doing fairly well. The good news is she will be getting her brace in the shoes next week on Wednesday. Hopefully we will be able to get this to heal without having to go back in the cast however she may need the cast in order to get the wound completely heal and then go from there. Fortunately there's no signs of infection at this time. 12/07/18 on evaluation today patient fortunately did receive her brace and she states she could tell this definitely makes her walk better. With that being said she's been having issues with her toe where she noticed yesterday there was a lot of tissue that was loosing off this appears to be much larger than what it was previous. She also states that her leg has been read putting much across the top of her foot just about the ankle although this seems to be receiving somewhat. The total area is still red and appears to be someone infected as best I can tell. She is previously taken Bactrim and that may be a good option for her today as well. We are gonna see what I wound culture shows as well and I think that this is definitely appropriate. With that being said outside of the culture I still need to initiate something in the interim and that's what I'm gonna go ahead and select Bactrim is a good option for her. 12/14/18 on evaluation today patient appears to be doing better in regard to her left great toe ulcer as compared to  last week's evaluation. There's still some erythema although this is significantly improved which is excellent news. Overall I do believe that she is making good progress is still gonna take some time before she is where I would like her to be from the standpoint of being able to place her back into the total contact cast. Hopefully we will be where we need to be by next week. 12/21/18 on evaluation today patient actually appears to be doing poorly in regard to her toe ulcer. She's been tolerating the dressing changes without complication. Fortunately there's no signs of systemic infection although she does have a lot of drainage from the toe ulcer and this does seem to be causing some issues at this point. She does have erythema on the distal portion of her toe that appears to be likely cellulitis. 12/28/18 on evaluation today patient actually appears to be doing a little better in my pinion in regard to her toe ulcer. With that being said she still does have some evidence of infection at this time and for her culture she had both E. coli as well as enterococcus as organisms noted on evaluation. For that reason I think that though the Keflex likely has treated the E. coli rather well this has really done nothing for the enterococcus. We are going to have to initiate treatment for this specifically. [name], [name] ([id number]) 01/04/19 on evaluation today patient's toe actually appears to be doing better from the standpoint of infection. She currently would  like to see about putting the cash back on I think that this is appropriate as long as she takes care of it and keeps it from getting wet. She is gonna have some drainage we can definitely pass this up with Drawtex and alginate to try to prevent as much drainage as possible from causing the problems. With that being said I do want to at least try her with the cast between now and Tuesday. If there any issues we can't continue to use it then I will  discontinue the use of the cast at that point. 01/08/19 on evaluation today patient actually appears to be doing very well as far as her foot ulcer specifically the great toe on the left is concerned. She did have an area of rubbing on the medial aspect of her left ankle which again is from the cast. Fortunately there's no signs of infection at this point in this appears to be a very slight skin breakdown. The patient tells me she felt it rubbing but didn't think it was that bad. Fortunately there is no signs of active infection at this time which is good news. No fevers, chills, nausea, or vomiting noted at this time. 01/15/19 on evaluation today patient actually appears to be doing well in regard to her toe ulcer. Again as previous she seems to do well and she has the cast on which indicates to me that during the time she doesn't have a cast on she's putting way too much pressure on this region. Obviously I think that's gonna be an issue as with the current national emergency concerning the Covid-19 Virus it has been recommended that we discontinue the use of total contact casting by the chief medical officer of our company, Dr. Simona Huh. The reasoning is that if a patient becomes sick and cannot come into have the cast removed they could not just leave this on for an additional two weeks. Obviously the hospitals also do not want to receive patient's who are sick into the emergency department to potentially contaminate the region and spread the Covid-19 Virus among other sick individuals within the hospital system. Therefore at this point we are suspending the use of total contact cast until the current emergency subsides. This was all discussed with the patient today as well. 01/22/19 on evaluation today patient's wound on her left great toe appears to be doing slightly worse than previously noted last week. She tells me that she has been on this quite a bit in fact she tells me she's been awake for 38  straight hours. This is due to the fact that she's having to care for grandparents because nobody else will. She has been taking care of them for five the last seven days since I've seen her they both have dementia his is from a stroke and her grandmother's was progressive. Nonetheless she states even her mom who knows her condition and situation has only help two of those days to take care of them she's been taking care of the rest. Fortunately there does not appear to be any signs of active infection in regard to her toe at this point although obviously it doesn't look as good as it did previous. I think this is directly related to her not taking off the pressure and friction by way of taking things easy. Though I completely understand what's going on. 01/29/19 on evaluation today patient's tools are actually appears to be showing some signs of improvement today compared to last week's  evaluation as far as not necessarily the overall size of the wound but the fact that she has some new skin growth in between the two ends of the wound opening. Overall I feel like she has done well she states that she had a family member give her what sounds to be a CAM walker boot which has been helpful as well. 02/05/19 on evaluation today patient's wound bed actually appears to be doing significantly better in regard to her overall appearance of the size of the wound. With that being said she is still having an issue with offloading efficiently enough to get this to close. Apparently there is some signs of infection at this point as well unfortunately. Previously she's done well of Augmentin I really do not see anything that needs to be culture currently but there theme and cellulitis of the foot that I'm seeing I'm gonna go ahead and place her on an antibiotic today to try to help clear this up. 02/12/2019 on evaluation today patient actually appears to be doing poorly in regard to her overall wound status. She tells  me she has been using her offloading shoe but actually comes in today wearing her tennis shoe with the AFO brace. Again as I previously discussed with her this is really not sufficient to allow the area to heal appropriately. Nonetheless she continues to be somewhat noncompliant and I do wonder based on what she has told my nurse in the past as to whether or not a good portion of this noncompliance may be recreational drug and alcohol related. She has had a history of heroin overdose and this was fairly recently in the past couple of months that have been seeing her. Nonetheless overall I feel like her wound looks significantly worse today compared to what it was previous. She still has significant erythema despite the Augmentin I am not sure that this is an appropriate medication for her infection I am also concerned that the infection may have gone down into her bone. 02/19/19 on evaluation today patient actually appears to be doing about the same in regard to her toe ulcer. Unfortunately she continues to show signs of bone exposure and infection at this point. There does not appear to be any evidence of worsening of the infection but I'm also not really sure that it's getting significantly better. She is on the Augmentin which should be sufficient for the Staphylococcus aureus infection that she has at this point. With that being said she may need IV antibiotics to more appropriately treat this. We did have a discussion today about hyperbaric option therapy. 02/28/19 on evaluation today patient actually appears to be doing much worse in regard to the wound on her left great toe as compared to even my previous evaluation last week. Unfortunately this seems to be training in a pretty poor direction. Her toe was actually now starting to angle laterally and I can actually see the entire joint area of the proximal portion of the digit where is the distal portion of the digit again is no longer even in  contact with the joint line. Unfortunately there's a lot more necrotic tissue around the edge and the toe appears to be showing signs of becoming gangrenous in my pinion. I'm very concerned about were things stand at this point. She did see infectious disease and they are planning to send in a prescription for Sivextro for her and apparently this has been approved. With that being said I don't think she should  avoid taking this but at the same time I'm not sure that it's gonna be sufficient to save her toe at this point. 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 03/05/19 on evaluation today patient actually appears to be doing quite well in regard to her toe all things considering. She still has bone exposed but there appears to be much less your thing on overall the appearance of the wound and the toe itself is dramatically improved. She still does have some issues currently obviously with infection she did see vascular as well and there concerned that her blood flow to the toad. For that reason they are setting up for an angiogram next week. 03/14/19 on evaluation today patient appears to be doing very poor in regard to her toe and specifically in regard to the ulceration and the fact that she's starting to notice the toe was leaning even more towards the lateral aspect and the complete joint is visible on the proximal aspect of the joint. Nonetheless she's also noted a significant odor and the tip of the toe is turning more dark and necrotic appearing. Overall I think she is getting worse not better as far as this is concerned. For that reason I am recommending at this point that she likely needs to be seen for likely amputation. READMISSION 03/19/2021 This is a patient that we cared for in this  clinic for a prolonged period of time in 2019 and 2020 with a left foot and left first toe wound. 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 ([id number]) 04/09/20 by Dr. Luana Shu. In December 2021 she had an ulcer on her right great toe as well as the fourth and fifth toes. 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 She also had an angiogram at that time and underwent angioplasty of the right anterior tibial artery. In any case she claims that the wound on the right foot is closed I did not look at this today which was probably an oversight although I think that should be done next week. After her surgery she developed a dehiscence but I do not see any follow-up of this. According to Dr. Deborra Medina last review that she was out of the area being cared for by another physician but recently came back to his attention. 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 A culture of this area showed E. coli apparently before she came back to see Dr. Luana Shu she was supposed to be receiving antibiotics but she did not really take them. Nor is she offloading this area at all. Finally her last hemoglobin A1c listed in epic was in March 2022 at 14.1 she says things are a lot better since then although I am not sure. 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 She had this wound at the time. During this admission she had E. coli sepsis a TEE was negative. Past medical history is extensive and includes type 2 diabetes with peripheral neuropathy cardiomyopathy with an ejection fraction of 33%, hypertension, hyperlipidemia chronic renal failure stage III history of substance abuse  with cocaine although she claims to be clean now verified by her mother. She is still a heavy  cigarette smoker. She has a history of bipolar disorder seizure disorder ABI in our clinic was 1.05 6/1; left midfoot in the setting of a TMA done previously. Round circular wound with a "knuckle" of protruding tissue. The problem is that the knuckle was not attached to any of the surrounding granulation and this probed proximally widely I removed a large portion of this tissue. This wound goes with considerable undermining laterally. I do not feel any bone there was no purulence but this is a deep wound. 6/8; in spite of the debridement I did last week. She arrives with a wound looking exactly the same. A protruding "knuckle" of tissue nonadherent to most of the surrounding tissue. There is considerable depth around this from 6-12 o'clock at 2.7 cm and undermining of 1 cm. This does not look overtly infected and the x-ray I did last week was negative for any osseous abnormalities. We have been using silver collagen 6/15; deep tissue culture I did last week showed moderate staph aureus and moderate Pseudomonas. This will definitely require prolonged antibiotic therapy. The pathology on the protuberant area was negative for malignancy fungus etc. 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 We have been using silver collagen. I am going to be prescribing Levaquin for 2 weeks. Her CT scan of the foot is down for 7/5 6/22; CT scan of the foot on 7 5. She says she has hardware in the left leg from her previous fracture. She is on the Levaquin for the deep tissue culture I did that showed methicillin sensitive staph aureus and Pseudomonas. I gave her a 2-week supply and she will have another week. She arrives in clinic today with the same protuberant tissue however this is nonadherent to the tissue surrounding it. I am really at a loss to explain this unless there is underlying deep tissue infection 6/29; patient presents for 1 week follow-up. She  has been using collagen to the wound bed. She reports taking her antibiotics as prescribed.She has no complaints or issues today. She denies signs of infection. 7/6; patient presents for one week followup. She has been using collagen to the wound bed. She states she is taking Levaquin however at times she is not able to keep it down. She denies signs of infection. 7/13; patient presents for 1 week follow-up. She has been using silver alginate to the wound bed. She still has nausea when taking her antibiotics. She denies signs of infection. 7/20; patient presents for 1 week follow-up. She has been using silver alginate with gentamicin cream to the wound bed. She denies any issues and has no complaints today. She denies signs of infection. Electronic Signature(s) Signed: 05/12/2021 2:25:26 PM By: Kalman Shan DO Entered By: Kalman Shan on 05/12/2021 14:06:15 [name] ([id number]) -------------------------------------------------------------------------------- Physical Exam Details Patient Name: Heather Dear A. Date of Service: 05/12/2021 1:00 PM Medical Record Number: [id number] Patient Account Number: [id number] Date of Birth/Sex: 08/04/1976 (45 y.o. F) Treating RN: Cornell Barman Primary Care Provider: Raelene Bott Other Clinician: Referring Provider: Raelene Bott Treating Provider/Extender: Yaakov Guthrie in Treatment: 7 Constitutional . Psychiatric . Notes Left foot: Wound to the plantar aspect that that has some granulation tissue and nonviable tissue present. There is significant callus circumferentially. Slight erythema to the surrounding skin Electronic Signature(s) Signed: 05/12/2021 2:25:26 PM By: Kalman Shan DO Entered By: Heber Monroe,  Dezire Turk on 05/12/2021 14:07:13 [name], [name] ([id number]) -------------------------------------------------------------------------------- Physician Orders Details Patient Name: [name], [name] Date of Service:  05/12/2021 1:00 PM Medical Record Number: [id number] Patient Account Number: [id number] Date of Birth/Sex: April 26, 1976 (45 y.o. F) Treating RN: Dolan Amen Primary Care Provider: Raelene Bott Other Clinician: Referring Provider: Raelene Bott Treating Provider/Extender: Yaakov Guthrie in Treatment: 7 Verbal / Phone Orders: No Diagnosis Coding ICD-10 Coding Code Description E11.621 Type 2 diabetes mellitus with foot ulcer 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ity E11.42 Type 2 diabetes mellitus with diabetic polyneuropathy Follow-up Appointments o Return Appointment in 1 week. Bathing/ Shower/ Hygiene o Clean wound with Normal Saline or wound cleanser. Off-Loading Left Lower Extremity o Open toe surgical shoe with peg assist. o Turn and reposition every 2 hours Medications-Please add to medication list. o P.O. Antibiotics Wound Treatment Wound #7 - Foot Wound Laterality: Plantar, Left, Midline Cleanser: Soap and Water 3 x Per Week/30 Days Discharge Instructions: Gently cleanse wound with antibacterial soap, rinse and pat dry prior to dressing wounds Peri-Wound Care: Desitin Maximum Strength Ointment 4 (oz) 3 x Per Week/30 Days Discharge Instructions: peri wound Topical: Gentamicin 3 x Per Week/30 Days Discharge Instructions: Apply to wound bed and tunnel area under the dressing Primary Dressing: Silvercel Small 2x2 (in/in) 3 x Per Week/30 Days Discharge Instructions: Apply to wound bed Secondary Dressing: ABD Pad 5x9 (in/in) 3 x Per Week/30 Days Discharge Instructions: Cover with ABD pad on top of dressing Secondary Dressing: Gauze 3 x Per Week/30 Days Discharge Instructions: To hold dressing in place Secured With: Kerlix Roll Sterile or Non-Sterile 6-ply 4.5x4 (yd/yd) 3 x Per Week/30 Days Discharge Instructions: Apply Kerlix as directed Patient Medications Allergies: erythromycin base Notifications  Medication Indication Start End ciprofloxacin HCl 05/12/2021 DOSE 1 - oral 500 mg tablet - 1 tablet twice daily for 7 days Klees, [name] ([id number]) Electronic Signature(s) Signed: 05/12/2021 2:24:56 PM By: Kalman Shan DO Entered By: Kalman Shan on 05/12/2021 14:24:56 [name], [name] ([id number]) -------------------------------------------------------------------------------- Problem List Details Patient Name: Heather Dear A. Date of Service: 05/12/2021 1:00 PM Medical Record Number: [id number] Patient Account Number: [id number] Date of Birth/Sex: 11/29/1975 (45 y.o. F) Treating RN: Cornell Barman Primary Care Provider: Raelene Bott Other Clinician: Referring Provider: Raelene Bott Treating Provider/Extender: Yaakov Guthrie in Treatment: 7 Active Problems ICD-10 Encounter Code Description Active Date MDM Diagnosis E11.621 Type 2 diabetes mellitus with foot ulcer 03/19/2021 No Yes 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 03/19/2021 No Yes severity E11.42 Type 2 diabetes mellitus with diabetic polyneuropathy 03/19/2021 No Yes Inactive Problems Resolved Problems Electronic Signature(s) Signed: 05/12/2021 2:25:26 PM By: Kalman Shan DO Entered By: Kalman Shan on 05/12/2021 14:04:58 Byrnes, Anita A. ([id number]) -------------------------------------------------------------------------------- Progress Note Details Patient Name: Heather Dear A. Date of Service: 05/12/2021 1:00 PM Medical Record Number: [id number] Patient Account Number: [id number] Date of Birth/Sex: 08-02-1976 (45 y.o. F) Treating RN: Cornell Barman Primary Care Provider: Raelene Bott Other Clinician: Referring Provider: Raelene Bott Treating Provider/Extender: Yaakov Guthrie in Treatment: 7 Subjective Chief Complaint Information obtained from Patient Left great toe ulcer 03/19/2021; patient referred by Dr. Luana Shu who has been looking  after her left foot for quite a period of time for review of a nonhealing area in the left midfoot History of Present Illness (HPI) 01/18/18-She is here for initial evaluation of the left great toe ulcer. She is a poor historian in regards to timeframe in detail.  She states approximately 4 weeks ago she lacerated her toe on something in the house. She followed up with her primary care who placed her on Bactrim and ultimately a second dose of Bactrim prior to coming to wound clinic. She states she has been treating the toe with peroxide, Betadine and a Band-Aid. She did not check her blood sugar this morning but checked it yesterday morning it was 327; she is unaware of a recent A1c and there are no current records. She saw Dr. she would've orthopedics last week for an old injury to the left ankle, she states he did not see her toe, nor did she bring it to his attention. She smokes approximately 1 pack cigarettes a day. Her social situation is concerning, she arrives this morning with her mother who appears extremely intoxicated/under the influence; her mother was asked to leave the room and be monitored by the patient's grandmother. The patient's aunt then accompanied the patient and the room throughout the rest of the appointment. We had a lengthy discussion regarding the deleterious effects of uncontrolled hyperglycemia and smoking as it relates to wound healing and overall health. She was strongly encouraged to decrease her smoking and get her diabetes under better control. She states she is currently on a diet and has cut down her [facility] consumption. The left toe is erythematous, macerated and slightly edematous with malodor present. The edema in her left foot is below her baseline, there is no erythema streaking. We will treat her with Santyl, doxycycline; we have ordered and xray, culture and provided a Peg assist surgical shoe and cultured the wound. 01/25/18-She is here in follow-up  evaluation for a left great toe ulcer and presents with an abscess to her suprapubic area. She states her blood sugars remain elevated, feeling "sick" and if levels are below 250, but she is trying. She has made no attempt to decrease her smoking stating that we "can't take away her food in her cigarettes". She has been compliant with offloading using the PEG assist you. She is using Santyl daily. the culture obtained last week grew staph aureus and Enterococcus faecalis; continues on the doxycycline and Augmentin was added on Monday. The suprapubic area has erythema, no femoral variation, purple discoloration, minimal induration, was accessed with a cotton tip applicator with sanguinopurulent drainage, this was cultured, I suspect the current antibiotic treatment will cover and we will not add anything to her current treatment plan. She was advised to go to urgent care or ER with any change in redness, induration or fever. 02/01/18-She is here in follow-up evaluation for left great toe ulcers and a new abdominal abscess from last week. She was able to use packing until earlier this week, where she "forgot it was there". She states she was feeling ill with GI symptoms last week and was not taking her antibiotic. She states her glucose levels have been predominantly less than 200, with occasional levels between 200-250. She thinks this was contributing to her GI symptoms as they have resolved without intervention. There continues to be significant laceration to left toe, otherwise it clinically looks stable/improved. There is now less superficial opening to the lateral aspect of the great toe that was residual blister. We will transition to [facility] to all wounds, she will continue her Augmentin. If there is no change or deterioration next week for reculture. 02/08/18-She is here in follow-up evaluation for left great toe ulcer and abdominal ulcer. There is an improvement in both wounds. She  has  been wrapping her left toe with coban, not by our direction, which has created an area of discoloration to the medial aspect; she has been advised to NOT use coban secondary to her neuropathy. She states her glucose levels have been high over this last week ranging from 200-350, she continues to smoke. She admits to being less compliant with her offloading shoe. We will continue with same treatment plan and she will follow-up next week. 02/15/18-She is here in follow-up evaluation for left great toe ulcer and abdominal ulcer. The abdominal ulcer is epithelialized. The left great toe ulcer is improved and all injury from last week using the Coban wrap is resolved, the lateral ulcer is healed. She admits to noncompliance with wearing offloading shoe and admits to glucose levels being greater than 300 most of the week. She continues to smoke and expresses no desire to quit. There is one area medially that probes deeper than it has historically, erythema to the toe and dorsal foot has consistently waxed and waned. There is no overt signs of cellulitis or infection but we will culture the wound for any occult infection given the new area of depth and erythema. We will hold off on sensitivities for initiation of antibiotic therapy. 02/22/18-She is here in follow up evaluation for left great toe ulcer. There is overall significant improvement in both wound appearance, erythema and edema with changes made last week. She was not initiated on antibiotic therapy. Culture obtained last week showed oxacillin sensitive staph aureus, sensitive to clindamycin. Clindamycin has been called into the pharmacy but she has been instructed to hold off on initiation secondary to overall clinical improvement and her history of antibiotic intolerance. She has been instructed to contact the clinic with any noted changes/deterioration and the wound, erythema, edema and/or pain. She will follow-up next week. She continues to smoke  and her glucose levels remain elevated >250; she admits to compliance with offloading shoe 03/01/18 on evaluation today patient appears to be doing fairly well in regard to her left first toe ulcer. She has been tolerating the dressing changes with the [name] Dressing without complication and overall this has definitely showed signs of improvement according to records as well is what the patient tells me today. I'm very pleased in that regard. She is having no pain today 03/08/18 She is here for follow up evaluation of a left great toe ulcer. She remains non-compliant with glucose control and smoking cessation; glucose levels consistently >200. She states that she got new shoe inserts/peg assist. She admits to compliance with offloading. Since my last evaluation there is significant improvement. We will switch to prisma at this time and she will follow up next week. She is noted to be tachycardic at this appointment, heart rate 120s; she has a history of heart rate 70-130 according to our records. She admits to extreme agitation r/t personal issues; she was advised to monitor her heartrate and contact her physician if it does not return to a more normal range (<100). She takes cardizem twice daily. 03/15/18-She is here in follow-up evaluation for left great toe ulcer. She remains noncompliant with glucose control and smoking cessation. She admits to compliance with wearing offloading shoe. The ulcer is improved/stable and we will continue with the same treatment plan and she will follow-up next week 03/22/18-She is here for evaluation for left great toe ulcer. There continues to be significant improvement despite recurrent hyperglycemia (over 500 yesterday) and she continues to smoke. She has been  compliant with offloading and we will continue with same treatment plan and she will follow-up next week. Corlett, Aryani A. ([id number]) 03/29/18-She is here for evaluation for left great toe ulcer.  Despite continuing to smoke and uncontrolled diabetes she continues to improve. She is compliant with offloading shoe. We will continue with the same treatment plan and she will follow-up next week 04/05/18- She is here in follow up evaluation for a left great toe ulcer; she presents with small pustule to left fifth toe (resembles ant bite). She admits to compliance with wearing offloading shoe; continues to smoke or have uncontrolled blood glucose control. There is more callus than usual with evidence of bleeding; she denies known trauma. 04/12/18-She is here for evaluation of left great toe ulcer. Despite noncompliance with glycemic control and smoking she continues to make improvement. She continues to wear offloading shoe. The pustule, that was identified last week, to the left fifth toe is resolved. She will follow-up in 2 weeks 05/03/18-she is seen in follow-up evaluation for a left great toe ulcer. She is compliant with offloading, otherwise noncompliant with glycemic control and smoking. She has plateaued and there is minimal improvement noted. We will transition to [facility], replaced the insert to her surgical shoe and she will follow-up in one week 05/10/18- She is here in follow up evaluation for a left great toe ulcer. It appears stable despite measurement change. We will continue with same treatment plan and follow up next week. 05/24/18-She is seen in follow-up evaluation for a left great toe ulcer. She remains compliant with offloading, has made significant improvement in her diet, decreasing the amount of sugar/soda. She said her recent A1c was 10.9 which is lower than. She did see a diabetic nutritionist/educator yesterday. She continues to smoke. We will continue with the same treatment plan and she'll follow-up next week. 05/31/18- She is seen in follow-up evaluation for left great toe ulcer. She continues to remain compliant with offloading, continues to make improvement in her  diet, increasing her water and decreasing the amount of sugar/soda. She does continue to smoke with no desire to quit. We will apply Prisma to the depth and Hydrofera Blue over. We have not received insurance authorization for oasis. She will follow up next week. 06/07/18-She is seen in follow-up evaluation for left great toe ulcer. It has stalled according to today's measurements although base appears stable. She says she saw a diabetic educator yesterday; her average blood sugars are less than 300 which is an improvement for her. She continues to smoke and states "that's my next step" She continues with water over soda. We will order for xray, culture and reinstate ace wrap compression prior to placing apligraf for next week. She is voicing no complaints or concerns. Her dressing will change to iodoflex over the next week in preparation for apligraf. 06/14/18-She is seen in follow-up evaluation for left great toe ulcer. Plain film x-ray performed last week was negative for osteomyelitis. Wound culture obtained last week grew strep B and OSSA; she is initiated on keflex and cefdinir today; there is erythema to the toe which could be from ace wrap compression, she has a history of wrapping too tight and has has been encouraged to maintain ace wraps that we place today. We will hold off on application of apligraf today, will apply next week after antibiotic therapy has been initiated. She admits today that she has resumed taking a shower with her foot/toe submerged in water, she has been reminded to  keep foot/toe out of the bath water. She will be seen in follow up next week 06/21/18-she is seen in follow-up evaluation for left great toe ulcer. She is tolerating antibiotic therapy with no GI disturbance. The wound is stable. Apligraf was applied today. She has been decreasing her smoking, only had 4 cigarettes yesterday and 1 today. She continues being more compliant in diabetic diet. She will follow-up  next week for evaluation of site, if stable will remove at 2 weeks. 06/28/18- She is here in follow up evalution. Apligraf was placed last week, she states the dressing fell off on Tuesday and she was dressing with hydrofera blue. She is healed and will be discharged from the clinic today. She has been instructed to continue with smoking cessation, continue monitoring glucose levels, offloading for an additional 4 weeks and continue with hydrofera blue for additional two weeks for any possible microscopic opening. Readmission: 08/07/18 on evaluation today patient presents for reevaluation concerning the ulcer of her right great toe. She was previously discharged on 06/28/18 healed. Nonetheless she states that this began to show signs of drainage she subsequently went to her primary care provider. Subsequently an x-ray was performed on 08/01/18 which was negative. The patient was also placed on antibiotics at that time. Fortunately they should have been effective for the infection. Nonetheless she's been experiencing some improvement but still has a lot of drainage coming from the wound itself. 08/14/18 on evaluation today patient's wound actually does show signs of improvement in regard to the erythema at this point. She has completed the antibiotics. With that being said we did discuss the possibility of placing her in a total contact cast as of today although I think that I may want to give this just a little bit more time to ensure nothing recurrence as far as her infection is concerned. I do not want to put in the cast and risk infection at that time if things are not completely resolved. With that being said she is gonna require some debridement today. 08/21/18 on evaluation today patient actually appears to be doing okay in regard to her toe ulcer. She's been tolerating the dressing changes without complication. With that being said it does appear that she is ready and in fact I think it's  appropriate for [place] to go ahead and initiate the total contact cast today. Nonetheless she will require some sharp debridement to prepare the wound for application. Overall I feel like things have been progressing well but we do need to do something to get this to close more readily. 08/24/18 patient seen today for reevaluation after having had the total contact cast applied on Tuesday. She seems to have done very well the wound appears to be doing great and overall I'm pleased with the progress that she's made. There were no abnormal areas of rubbing from the cast on her lower extremity. 08/30/18 on evaluation today patient actually appears to be completely healed in regard to her plantar toe ulcer. She tells me at this point she's been having a lot of issues with the cast. She almost fell a couple of times the state shall the step of her dog a couple times as well. This is been a very frustrating process for her other nonetheless she has completely healed the wound which is excellent news. Overall there does not appear to be the evidence of infection at this time which is great news. 09/11/18 evaluation today patient presents for follow-up concerning her great toe ulcer  on the left which has unfortunately reopened since I last saw her which was only a couple of weeks ago. Unfortunately she was not able to get in to get the shoe and potentially the AFO that's gonna be necessary due to her left foot drop. She continues with offloading shoe but this is not enough to prevent her from reopening it appears. When we last had her in the total contact cast she did well from a healing standpoint but unfortunately the wound reopened as soon as she came out of the cast within just a couple of weeks. Right now the biggest concern is that I do believe the foot drop is leading to the issue and this is gonna continue to be an issue unfortunately until we get things under control as far as the walking anomaly is  concerned with the foot drop. This is also part of the reason why she falls on a regular basis. I just do not believe that is gonna be safe for [place] to reinitiate the total contact cast as last time we had this on she fell 3 times one week which is definitely not normal for her. 09/18/18 upon evaluation today the patient actually appears to be doing about the same in regard to her toe ulcer. She did not contact [facility] as I asked her to even though I had given her the prescription. In fact she actually states that she has no idea where the prescription is. She did apparently call [employer] and they told her that all she needed to do was bring the prescription in order to be able to be seen and work on getting the AFO for her left foot. With all that being said she still does not have an appointment and I'm not sure were things stand that regard. I will give her a new prescription today in order to contact them to get this set up. [name], [name] ([id number]) 09/25/18 on evaluation today patient actually appears to be doing about the same in regard to her toes ulcer. She does have a small areas which seems to have a lot of callous buildup around the edge of the wound which is going to need sharp debridement today. She still is waiting to be scheduled for evaluation with [facility] for possibility of an AFO. She states there supposed to call her tomorrow to get this set up. Unfortunately it does appear that her foot specifically the toe area is showing signs of erythema. There does not appear to be any systemic infection which is in these good news. 10/02/18 on evaluation today patient actually appears to be doing about the same in regard to her toe ulcer. This really has not done too well although it's not significantly larger it's also not significantly smaller. She has been tolerating the dressing changes without complication. She actually has her appointment with [facility] and [place] tomorrow to hopefully  be measured for obtaining and AFO splint. I think this would be helpful preventing this from reoccurring. We had contemplated starting the cast this week although to be honest I am reluctant to do that as she's been having nausea, vomiting, and seizure activity over the past three days. She has a history of seizures and have been told is nothing that can be done for these. With that being said I do believe that along with the seizures have the nausea vomiting which upon further questioning doesn't seem to be the normal for her and makes me concerned for the possibility of  infection or something else going on. I discussed this with the patient and her mother during the office visit today. I do not feel the wound is effective but maybe something else. The responses this was "this just happens to her at times and we don't know why". They did not seem to be interested in going to the hospital to have this checked out further. 10/09/18 on evaluation today patient presents for follow-up concerning her ongoing toe ulcer. She has been tolerating the dressing changes without complication. Fortunately there does not appear to be any evidence of infection which is great news however I do think that the patient would benefit from going ahead for with the total contact cast. She's actually in a wheelchair today she tells me that she will use her walker if we initiate the cast. I was very specific about the fact that if we were gonna do the cast I wanted to make sure that she was using the walker in order to prevent any falls. She tells me she does not have stairs that she has to traverse on a regular basis at her home. She has not had any seizures since last week again that something that happens to her often she tells me she did talk to [employer] and they said that it may take up to three weeks to get the brace approved for her. Hopefully that will not take that long but nonetheless in the meantime I do think the  cast could be of benefit. 10/12/18 on evaluation today patient appears to be doing rather well in regard to her toe ulcer. It's just been a few days and already this is significantly improved both as far as overall appearance and size. Fortunately there's no sign of infection. She is here for her first obligatory cast change. 10/19/18 Seen today for follow up and management of left great toe ulcer. Wound continues to show improvement. Noted small open area with seroussang drainage with palpation. Denies any increased pain or recent fevers during visit. She will continue calcium alginate with offloading shoe. Denies any questions or concerns during visit. 10/26/18 on evaluation today patient appears to be doing about the same as when I last saw her in regard to her wound bed. Fortunately there does not appear to be any signs of infection. Unfortunately she continues to have a breakdown in regard to the toe region any time that she is not in the cast. It takes almost no time at all for this to happen. Nonetheless she still has not heard anything from the brace being made by [employer] as to when exactly this will be available to her. Fortunately there is no signs of infection at this time. 10/30/18 on evaluation today patient presents for application of the total contact cast as we just received him this morning. Fortunately we are gonna be able to apply this to her today which is great news. She continues to have no significant pain which is good news. Overall I do feel like things have been improving while she was the cast is when she doesn't have a cast that things get worse. She still has not really heard anything from [facility] regarding her brace. 11/02/18 upon evaluation today patient's wound already appears to be doing significantly better which is good news. Fortunately there does not appear to be any signs of infection also good news. Overall I do think the total contact cast as before is helping to  heal this area unfortunately it's just not gonna  likely keep the area closed and healed without her getting her brace at least. Again the foot drop is a significant issue for her. 11/09/18 on evaluation today patient appears to be doing excellent in regard to her toe ulcer which in fact is completely healed. Fortunately we finally got the situation squared away with the paperwork which was needed to proceed with getting her brace approved by Medicaid. I have filled that out unfortunately that information has been sent to the orthopedic office that I worked at 2 1/2 years ago and not tired Current wound care measures. Fortunately she seems to be doing very well at this time. 11/23/18 on evaluation today patient appears to be doing More Poorly Compared to Last Time I Saw Her. At [facility] She Had Completely Healed. Currently she is continuing to have issues with reopening. She states that she just found out that the brace was approved through Medicaid now she just has to go get measured in order to have this fitted for her and then made. Subsequently she does not have an appointment for this yet that is going to complicate things we obviously cannot put her back in the cast if we do not have everything measured because they're not gonna be able to measure her foot while she is in the cast. 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 Obviously this is unfortunate and does have me somewhat worried as well. 11/30/18 on evaluation today patient's toe ulcer actually appears to be doing fairly well. The good news is she will be getting her brace in the shoes next week on Wednesday. Hopefully we will be able to get this to heal without having to go back in the cast however she may need the cast in order to get the wound completely heal and then go from there. Fortunately there's no signs of infection at this  time. 12/07/18 on evaluation today patient fortunately did receive her brace and she states she could tell this definitely makes her walk better. With that being said she's been having issues with her toe where she noticed yesterday there was a lot of tissue that was loosing off this appears to be much larger than what it was previous. She also states that her leg has been read putting much across the top of her foot just about the ankle although this seems to be receiving somewhat. The total area is still red and appears to be someone infected as best I can tell. She is previously taken Bactrim and that may be a good option for her today as well. We are gonna see what I wound culture shows as well and I think that this is definitely appropriate. With that being said outside of the culture I still need to initiate something in the interim and that's what I'm gonna go ahead and select Bactrim is a good option for her. 12/14/18 on evaluation today patient appears to be doing better in regard to her left great toe ulcer as compared to last week's evaluation. There's still some erythema although this is significantly improved which is excellent news. Overall I do believe that she is making good progress is still gonna take some time before she is where I would like her to be from the standpoint of being able to place her back into the total contact cast. Hopefully we will be where we need to  be by next week. 12/21/18 on evaluation today patient actually appears to be doing poorly in regard to her toe ulcer. She's been tolerating the dressing changes without complication. Fortunately there's no signs of systemic infection although she does have a lot of drainage from the toe ulcer and this does [name], [name] ([id number]) seem to be causing some issues at this point. She does have erythema on the distal portion of her toe that appears to be likely cellulitis. 12/28/18 on evaluation today patient actually  appears to be doing a little better in my pinion in regard to her toe ulcer. With that being said she still does have some evidence of infection at this time and for her culture she had both E. coli as well as enterococcus as organisms noted on evaluation. For that reason I think that though the Keflex likely has treated the E. coli rather well this has really done nothing for the enterococcus. We are going to have to initiate treatment for this specifically. 01/04/19 on evaluation today patient's toe actually appears to be doing better from the standpoint of infection. She currently would like to see about putting the cash back on I think that this is appropriate as long as she takes care of it and keeps it from getting wet. She is gonna have some drainage we can definitely pass this up with Drawtex and alginate to try to prevent as much drainage as possible from causing the problems. With that being said I do want to at least try her with the cast between now and Tuesday. If there any issues we can't continue to use it then I will discontinue the use of the cast at that point. 01/08/19 on evaluation today patient actually appears to be doing very well as far as her foot ulcer specifically the great toe on the left is concerned. She did have an area of rubbing on the medial aspect of her left ankle which again is from the cast. Fortunately there's no signs of infection at this point in this appears to be a very slight skin breakdown. The patient tells me she felt it rubbing but didn't think it was that bad. Fortunately there is no signs of active infection at this time which is good news. No fevers, chills, nausea, or vomiting noted at this time. 01/15/19 on evaluation today patient actually appears to be doing well in regard to her toe ulcer. Again as previous she seems to do well and she has the cast on which indicates to me that during the time she doesn't have a cast on she's putting way too much  pressure on this region. Obviously I think that's gonna be an issue as with the current national emergency concerning the Covid-19 Virus it has been recommended that we discontinue the use of total contact casting by the chief medical officer of our company, Dr. Simona Huh. The reasoning is that if a patient becomes sick and cannot come into have the cast removed they could not just leave this on for an additional two weeks. Obviously the hospitals also do not want to receive patient's who are sick into the emergency department to potentially contaminate the region and spread the Covid-19 Virus among other sick individuals within the hospital system. Therefore at this point we are suspending the use of total contact cast until the current emergency subsides. This was all discussed with the patient today as well. 01/22/19 on evaluation today patient's wound on her left great toe appears to  be doing slightly worse than previously noted last week. She tells me that she has been on this quite a bit in fact she tells me she's been awake for 38 straight hours. This is due to the fact that she's having to care for grandparents because nobody else will. She has been taking care of them for five the last seven days since I've seen her they both have dementia his is from a stroke and her grandmother's was progressive. Nonetheless she states even her mom who knows her condition and situation has only help two of those days to take care of them she's been taking care of the rest. Fortunately there does not appear to be any signs of active infection in regard to her toe at this point although obviously it doesn't look as good as it did previous. I think this is directly related to her not taking off the pressure and friction by way of taking things easy. Though I completely understand what's going on. 01/29/19 on evaluation today patient's tools are actually appears to be showing some signs of improvement today compared  to last week's evaluation as far as not necessarily the overall size of the wound but the fact that she has some new skin growth in between the two ends of the wound opening. Overall I feel like she has done well she states that she had a family member give her what sounds to be a CAM walker boot which has been helpful as well. 02/05/19 on evaluation today patient's wound bed actually appears to be doing significantly better in regard to her overall appearance of the size of the wound. With that being said she is still having an issue with offloading efficiently enough to get this to close. Apparently there is some signs of infection at this point as well unfortunately. Previously she's done well of Augmentin I really do not see anything that needs to be culture currently but there theme and cellulitis of the foot that I'm seeing I'm gonna go ahead and place her on an antibiotic today to try to help clear this up. 02/12/2019 on evaluation today patient actually appears to be doing poorly in regard to her overall wound status. She tells me she has been using her offloading shoe but actually comes in today wearing her tennis shoe with the AFO brace. Again as I previously discussed with her this is really not sufficient to allow the area to heal appropriately. Nonetheless she continues to be somewhat noncompliant and I do wonder based on what she has told my nurse in the past as to whether or not a good portion of this noncompliance may be recreational drug and alcohol related. She has had a history of heroin overdose and this was fairly recently in the past couple of months that have been seeing her. Nonetheless overall I feel like her wound looks significantly worse today compared to what it was previous. She still has significant erythema despite the Augmentin I am not sure that this is an appropriate medication for her infection I am also concerned that the infection may have gone down into her  bone. 02/19/19 on evaluation today patient actually appears to be doing about the same in regard to her toe ulcer. Unfortunately she continues to show signs of bone exposure and infection at this point. There does not appear to be any evidence of worsening of the infection but I'm also not really sure that it's getting significantly better. She is on the Augmentin  which should be sufficient for the Staphylococcus aureus infection that she has at this point. With that being said she may need IV antibiotics to more appropriately treat this. We did have a discussion today about hyperbaric option therapy. 02/28/19 on evaluation today patient actually appears to be doing much worse in regard to the wound on her left great toe as compared to even my previous evaluation last week. Unfortunately this seems to be training in a pretty poor direction. Her toe was actually now starting to angle laterally and I can actually see the entire joint area of the proximal portion of the digit where is the distal portion of the digit again is no longer even in contact with the joint line. Unfortunately there's a lot more necrotic tissue around the edge and the toe appears to be showing signs of becoming gangrenous in my pinion. I'm very concerned about were things stand at this point. She did see infectious disease and they are planning to send in a prescription for Sivextro for her and apparently this has been approved. With that being said I don't think she should avoid taking this but at the same time I'm not sure that it's gonna be sufficient to save her toe at this point. 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 03/05/19 on evaluation today patient actually appears to be doing quite well in regard to her toe all  things considering. She still has bone exposed but there appears to be much less your thing on overall the appearance of the wound and the toe itself is dramatically improved. She still does have some issues currently obviously with infection she did see vascular as well and there concerned that her blood flow to the toad. For that reason they are setting up for an angiogram next week. 03/14/19 on evaluation today patient appears to be doing very poor in regard to her toe and specifically in regard to the ulceration and the fact that she's starting to notice the toe was leaning even more towards the lateral aspect and the complete joint is visible on the proximal aspect of the joint. Nonetheless she's also noted a significant odor and the tip of the toe is turning more dark and necrotic appearing. Overall I think she is getting worse not better as far as this is concerned. For that reason I am recommending at this point that she likely needs to be seen for likely amputation. ESME, NEGRO ([id number]) READMISSION 03/19/2021 This is a patient that we cared for in this clinic for a prolonged period of time in 2019 and 2020 with a left foot and left first toe wound. 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 Luana Shu. In December 2021 she had an ulcer on her right great toe as well as the fourth and fifth toes. 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 She also had an angiogram at that time and underwent angioplasty of the right anterior tibial artery. In any case she claims that the wound on the right foot is closed I did not look at this today which was probably an oversight although I think  that should be done next week. After her surgery she developed a dehiscence but I do not see any follow-up of this. According to Dr. Deborra Medina last review that  she was out of the area being cared for by another physician but recently came back to his attention. 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 A culture of this area showed E. coli apparently before she came back to see Dr. Luana Shu she was supposed to be receiving antibiotics but she did not really take them. Nor is she offloading this area at all. Finally her last hemoglobin A1c listed in epic was in March 2022 at 14.1 she says things are a lot better since then although I am not sure. 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 She had this wound at the time. During this admission she had E. coli sepsis a TEE was negative. 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 She is still a heavy cigarette smoker. She has a history of bipolar disorder seizure disorder ABI in our clinic was 1.05 6/1; left midfoot in the setting of a TMA done previously. Round circular wound with a "knuckle" of protruding tissue. The problem is that the knuckle was not attached to any of the surrounding granulation and this probed proximally widely I removed a large portion of this tissue. This wound goes with considerable undermining laterally. I do not feel any bone there was no purulence but this is a deep wound. 6/8; in spite of the debridement I did last week. She arrives with a wound looking exactly the same. A protruding "knuckle" of tissue nonadherent to most of the surrounding tissue. There is considerable depth around this from 6-12 o'clock at 2.7 cm and undermining of 1 cm. This does not look overtly infected and the x-ray I did last week was negative for any osseous  abnormalities. We have been using silver collagen 6/15; deep tissue culture I did last week showed moderate staph aureus and moderate Pseudomonas. This will definitely require prolonged antibiotic therapy. The pathology on the protuberant area was negative for malignancy fungus etc. 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 We have been using silver collagen. I am going to be prescribing Levaquin for 2 weeks. Her CT scan of the foot is down for 7/5 6/22; CT scan of the foot on 7 5. She says she has hardware in the left leg from her previous fracture. She is on the Levaquin for the deep tissue culture I did that showed methicillin sensitive staph aureus and Pseudomonas. I gave her a 2-week supply and she will have another week. She arrives in clinic today with the same protuberant tissue however this is nonadherent to the tissue surrounding it. I am really at a loss to explain this unless there is underlying deep tissue infection 6/29; patient presents for 1 week follow-up. She has been using collagen to the wound bed. She reports taking her antibiotics as prescribed.She has no complaints or issues today. She denies signs of infection. 7/6; patient presents for one week followup. She has been using collagen to the wound bed. She states she is taking Levaquin however at times she is not able to  keep it down. She denies signs of infection. 7/13; patient presents for 1 week follow-up. She has been using silver alginate to the wound bed. She still has nausea when taking her antibiotics. She denies signs of infection. 7/20; patient presents for 1 week follow-up. She has been using silver alginate with gentamicin cream to the wound bed. She denies any issues and has no complaints today. She denies signs of infection. Patient History Information obtained from Patient. Social History Current every day smoker, Marital Status - Single, Alcohol Use  - Never, Drug Use - Prior History, Caffeine Use - Daily. Medical History Ear/Nose/Mouth/Throat Patient has history of Chronic sinus problems/congestion, Middle ear problems Hematologic/Lymphatic Patient has history of Anemia Respiratory Patient has history of Chronic Obstructive Pulmonary Disease (COPD) Cardiovascular Patient has history of Congestive Heart Failure - EF 33% Endocrine Patient has history of Type II Diabetes Genitourinary Patient has history of End Stage Renal Disease Integumentary (Skin) Patient has history of History of pressure wounds Neurologic Patient has history of Neuropathy - feel and legs [name], [name] ([id number]) Objective Constitutional Vitals Time Taken: 1:17 PM, Height: 69 in, Weight: 185 lbs, BMI: 27.3, Temperature: 98.8 F, Pulse: 72 bpm, Respiratory Rate: 18 breaths/min, Blood Pressure: 110/77 mmHg. General Notes: Left foot: Wound to the plantar aspect that that has some granulation tissue and nonviable tissue present. There is significant callus circumferentially. Slight erythema to the surrounding skin Integumentary (Hair, Skin) Wound #7 status is Open. Original cause of wound was Gradually Appeared. The date acquired was: 08/07/2020. The wound has been in treatment 7 weeks. The wound is located on the [place]. The wound measures 1.8cm length x 1.8cm width x 1cm depth; 2.545cm^2 area and 2.545cm^3 volume. There is Fat Layer (Subcutaneous Tissue) exposed. There is no tunneling noted, however, there is undermining starting at 8:00 and ending at 4:00 with a maximum distance of 0.5cm. There is a medium amount of serosanguineous drainage noted. The wound margin is thickened. There is medium (34-66%) pink granulation within the wound bed. There is a medium (34-66%) amount of necrotic tissue within the wound bed including Adherent Slough. Assessment Active Problems ICD-10 Type 2 diabetes mellitus with foot ulcer Non-pressure chronic  ulcer of other part of left foot with other specified severity Type 2 diabetes mellitus with diabetic polyneuropathy Patient's wound has shown signs of improvement in terms of appearance. The size is stable. I debrided nonviable tissue. She had slight erythema on exam and some pain during palpation. She was started on antibiotics one month ago for previous wound culture that showed staph aureus and Pseudomonas. It is questionable whether patient completed course. I placed gentamicin cream on the wound last clinic visit and this seemed to improve the appearance. I will call in ciprofloxacin which will cover staph and Pseudomonas Which may not have been completely treated previously. I recommended continuing silver alginate. Follow-up in 1 week Procedures Wound #7 Pre-procedure diagnosis of Wound #7 is a Pressure Ulcer located on the Left,Midline,Plantar Foot . There was a Excisional Skin/Subcutaneous Tissue Debridement with a total area of 3.24 sq cm performed by Kalman Shan, MD. With the following instrument(s): Curette to remove Viable and Non-Viable tissue/material. Material removed includes Subcutaneous Tissue and Slough and. A time out was conducted at 16:40, prior to the start of the procedure. A Minimum amount of bleeding was controlled with Pressure. The procedure was tolerated well. Post Debridement Measurements: 1.8cm length x 1.8cm width x 1.1cm depth; 2.799cm^3 volume. Post debridement Stage noted as Category/Stage II.  Character of Wound/Ulcer Post Debridement is stable. Post procedure Diagnosis Wound #7: Same as Pre-Procedure Plan Follow-up Appointments: Return Appointment in 1 week. Bathing/ Shower/ Hygiene: Clean wound with Normal Saline or wound cleanser. Off-Loading: Open toe surgical shoe with peg assist. Turn and reposition every 2 hours Medications-Please add to medication list.: P.O. Antibiotics WOUND #7: - Foot Wound Laterality: Plantar, Left, Midline Stivers,  Milley A. ([id number]) Cleanser: Soap and Water 3 x Per Week/30 Days Discharge Instructions: Gently cleanse wound with antibacterial soap, rinse and pat dry prior to dressing wounds Peri-Wound Care: Desitin Maximum Strength Ointment 4 (oz) 3 x Per Week/30 Days Discharge Instructions: peri wound Topical: Gentamicin 3 x Per Week/30 Days Discharge Instructions: Apply to wound bed and tunnel area under the dressing Primary Dressing: Silvercel Small 2x2 (in/in) 3 x Per Week/30 Days Discharge Instructions: Apply to wound bed Secondary Dressing: ABD Pad 5x9 (in/in) 3 x Per Week/30 Days Discharge Instructions: Cover with ABD pad on top of dressing Secondary Dressing: Gauze 3 x Per Week/30 Days Discharge Instructions: To hold dressing in place Secured With: Kerlix Roll Sterile or Non-Sterile 6-ply 4.5x4 (yd/yd) 3 x Per Week/30 Days Discharge Instructions: Apply Kerlix as directed 1. In office sharp debridement 2. Gentamicin cream with silver alginate 3. Ciprofloxacin 4. Follow-up in 1 week Electronic Signature(s) Signed: 05/12/2021 2:25:26 PM By: Kalman Shan DO Entered By: Kalman Shan on 05/12/2021 14:11:10 [name] ([id number]) -------------------------------------------------------------------------------- ROS/PFSH Details Patient Name: Heather Dear A. Date of Service: 05/12/2021 1:00 PM Medical Record Number: [id number] Patient Account Number: [id number] Date of Birth/Sex: 26-May-1976 (45 y.o. F) Treating RN: Cornell Barman Primary Care Provider: Raelene Bott Other Clinician: Referring Provider: Raelene Bott Treating Provider/Extender: Yaakov Guthrie in Treatment: 7 Information Obtained From Patient Ear/Nose/Mouth/Throat Medical History: Positive for: Chronic sinus problems/congestion; Middle ear problems Hematologic/Lymphatic Medical History: Positive for: Anemia Respiratory Medical History: Positive for: Chronic Obstructive Pulmonary Disease  (COPD) Cardiovascular Medical History: Positive for: Congestive Heart Failure - EF 33% Endocrine Medical History: Positive for: Type II Diabetes Time with diabetes: 14 years Treated with: Insulin, Oral agents Blood sugar tested every day: No Blood sugar testing results: Bedtime: 176 Genitourinary Medical History: Positive for: End Stage Renal Disease Integumentary (Skin) Medical History: Positive for: History of pressure wounds Neurologic Medical History: Positive for: Neuropathy - feel and legs HBO Extended History Items Ear/Nose/Mouth/Throat: Ear/Nose/Mouth/Throat: Chronic sinus problems/congestion Middle ear problems Immunizations Pneumococcal Vaccine: Received Pneumococcal Vaccination: No Haden, Special A. ([id number]) Implantable Devices No devices added Family and Social History Current every day smoker; Marital Status - Single; Alcohol Use: Never; Drug Use: Prior History; Caffeine Use: Daily Electronic Signature(s) Signed: 05/12/2021 2:25:26 PM By: Kalman Shan DO Signed: 05/13/2021 11:45:43 AM By: Gretta Cool, BSN, RN, CWS, [name] RN, BSN Entered By: Kalman Shan on 05/12/2021 14:06:22 [name] ([id number]) -------------------------------------------------------------------------------- [facility] Details Patient Name: Heather Dear A. Date of Service: 05/12/2021 Medical Record Number: [id number] Patient Account Number: [id number] Date of Birth/Sex: [birth year] (45 y.o. F) Treating RN: Dolan Amen Primary Care Provider: Raelene Bott Other Clinician: Referring Provider: Raelene Bott Treating Provider/Extender: Yaakov Guthrie in Treatment: 7 Diagnosis Coding ICD-10 Codes Code Description E11.621 Type 2 diabetes mellitus with foot ulcer 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ity E11.42 Type 2 diabetes mellitus with diabetic polyneuropathy Facility Procedures CPT4 Code: [id number] Description:  [id number] - DEB SUBQ TISSUE 20 SQ CM/< Modifier: Quantity: 1 CPT4 Code: Description: ICD-10 Diagnosis Description L97.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 of other part of left foot  with other specified Modifier: severity Quantity: Physician Procedures CPT4 Code: [id number] Description: [id number] - WC PHYS SUBQ TISS 20 SQ CM Modifier: Quantity: 1 CPT4 Code: Description: ICD-10 Diagnosis Description 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 Modifier: severity Quantity: Electronic Signature(s) Signed: 05/12/2021 2:25:26 PM By: Kalman Shan DO Signed: 05/12/2021 4:29:14 PM By: Dolan Amen RN Entered By: Dolan Amen on 05/12/2021 13:49:10

## 2021-05-19 ENCOUNTER — Other Ambulatory Visit: Payer: Self-pay

## 2021-05-19 ENCOUNTER — Encounter (HOSPITAL_BASED_OUTPATIENT_CLINIC_OR_DEPARTMENT_OTHER): Payer: Medicaid Other | Admitting: Internal Medicine

## 2021-05-19 DIAGNOSIS — L97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ity: Secondary | ICD-10-CM | POA: Diagnosis not present

## 2021-05-19 DIAGNOSIS — E11621 Type 2 diabetes mellitus with foot ulcer: Secondary | ICD-10-CM | POA: Diagnosis not present

## 2021-05-19 NOTE — Progress Notes (Signed)
CHEY, RACHELS ([id number]) Visit Report for 05/19/2021 Arrival Information Details Patient Name: [name], [name] Date of Service: 05/19/2021 1:00 PM Medical Record Number: [id number] Patient Account Number: [id number] Date of Birth/Sex: Sep 02, 1976 (45 y.o. F) Treating RN: Dolan Amen Primary Care Madeline Pho: Raelene Bott Other Clinician: Referring Kelcy Baeten: Raelene Bott Treating Sabria Florido/Extender: Yaakov Guthrie in Treatment: 8 Visit Information History Since Last Visit Has Dressing in Place as Prescribed: Yes Patient Arrived: Wheel Chair Pain Present Now: Yes Arrival Time: 13:00 Accompanied By: mother Transfer Assistance: None Patient Identification Verified: Yes Secondary Verification Process Completed: Yes Patient Requires Transmission-Based No Precautions: Patient Has Alerts: Yes Patient Alerts: Patient on Blood Thinner 38m apirin Diabetic Type II Electronic Signature(s) Signed: 05/19/2021 4:59:39 PM By: SDolan AmenRN Entered By: SDolan Amenon 05/19/2021 13:00:52 SLonzo Candy([id number] -------------------------------------------------------------------------------- Clinic Level of Care Assessment Details Patient Name: [name] Date of Service: 05/19/2021 1:00 PM Medical Record Number: [id number] Account Number: [id number] of Birth/Sex: [birth year](45y.o. F) Treating RN: SDolan AmenPrimary Care Shahla Betsill: HRaelene BottOther Clinician: Referring Ifeanyi Mickelson: HRaelene BottTreating Deleah Tison/Extender: HYaakov Guthriein Treatment: 8 Clinic Level of Care Assessment Items TOOL 1 Quantity Score []  - Use when EandM and Procedure is performed on INITIAL visit 0 ASSESSMENTS - Nursing Assessment / Reassessment []  - General Physical Exam (combine w/ comprehensive assessment (listed just below) when performed on new 0 pt. evals) []  - 0 Comprehensive Assessment (HX, ROS, Risk Assessments, Wounds Hx,  etc.) ASSESSMENTS - Wound and Skin Assessment / Reassessment []  - Dermatologic / Skin Assessment (not related to wound area) 0 ASSESSMENTS - Ostomy and/or Continence Assessment and Care []  - Incontinence Assessment and Management 0 []  - 0 Ostomy Care Assessment and Management (repouching, etc.) PROCESS - Coordination of Care []  - Simple Patient / Family Education for ongoing care 0 []  - 0 Complex (extensive) Patient / Family Education for ongoing care []  - 0 Staff obtains CProgrammer, systems Records, Test Results / Process Orders []  - 0 Staff telephones HHA, Nursing Homes / Clarify orders / etc []  - 0 Routine Transfer to another Facility (non-emergent condition) []  - 0 Routine Hospital Admission (non-emergent condition) []  - 0 New Admissions / IBiomedical engineer/ Ordering NPWT, Apligraf, etc. []  - 0 Emergency Hospital Admission (emergent condition) PROCESS - Special Needs []  - Pediatric / Minor Patient Management 0 []  - 0 Isolation Patient Management []  - 0 Hearing / Language / Visual special needs []  - 0 Assessment of Community assistance (transportation, D/C planning, etc.) []  - 0 Additional assistance / Altered mentation []  - 0 Support Surface(s) Assessment (bed, cushion, seat, etc.) INTERVENTIONS - Miscellaneous []  - External ear exam 0 []  - 0 Patient Transfer (multiple staff / HCivil Service fast streamer/ Similar devices) []  - 0 Simple Staple / Suture removal (25 or less) []  - 0 Complex Staple / Suture removal (26 or more) []  - 0 Hypo/Hyperglycemic Management (do not check if billed separately) []  - 0 Ankle / Brachial Index (ABI) - do not check if billed separately Has the patient been seen at the hospital within the last three years: Yes Total Score: 0 Level Of Care: ____ SLonzo Candy([id number] Electronic Signature(s) Signed: 05/19/2021 4:59:39 PM By: SDolan AmenRN Entered By: SDolan Amenon 05/19/2021 13:29:56 SLonzo Candy ([id number] -------------------------------------------------------------------------------- Encounter Discharge Information Details Patient Name: [name] Date of Service: 05/19/2021 1:00 PM Medical Record Number: [id number] Account Number: [id number] of Birth/Sex: 912-Dec-1977(  45 y.o. F) Treating RN: Dolan Amen Primary Care Emrah Ariola: Raelene Bott Other Clinician: Referring Sonnia Strong: Raelene Bott Treating Liviah Cake/Extender: Yaakov Guthrie in Treatment: 8 Encounter Discharge Information Items Post Procedure Vitals Discharge Condition: Stable Temperature (F): 98.6 Ambulatory Status: Wheelchair Pulse (bpm): 78 Discharge Destination: Home Respiratory Rate (breaths/min): 18 Transportation: Private Auto Blood Pressure (mmHg): 92/59 Accompanied By: mother Schedule Follow-up Appointment: Yes Clinical Summary of Care: Electronic Signature(s) Signed: 05/19/2021 4:59:39 PM By: Dolan Amen RN Entered By: Dolan Amen on 05/19/2021 13:30:56 [name] ([id number]) -------------------------------------------------------------------------------- Lower Extremity Assessment Details Patient Name: Heather Dear A. Date of Service: 05/19/2021 1:00 PM Medical Record Number: [id number] Patient Account Number: [id number] Date of Birth/Sex: Feb 08, 1976 (45 y.o. F) Treating RN: Dolan Amen Primary Care Wretha Laris: Raelene Bott Other Clinician: Referring Butch Otterson: Raelene Bott Treating Lutricia Widjaja/Extender: Yaakov Guthrie in Treatment: 8 Edema Assessment Assessed: [Left: Yes] [Right: No] Edema: [Left: N] [Right: o] Vascular Assessment Pulses: Dorsalis Pedis Palpable: [Left:Yes] Electronic Signature(s) Signed: 05/19/2021 4:59:39 PM By: Dolan Amen RN Entered By: Dolan Amen on 05/19/2021 13:10:23 Bertsch, [name] ([id number]) -------------------------------------------------------------------------------- Multi Wound Chart  Details Patient Name: Heather Dear A. Date of Service: 05/19/2021 1:00 PM Medical Record Number: [id number] Patient Account Number: [id number] Date of Birth/Sex: 1976-02-10 (45 y.o. F) Treating RN: Dolan Amen Primary Care Daden Mahany: Raelene Bott Other Clinician: Referring Traves Majchrzak: Raelene Bott Treating Arline Ketter/Extender: Yaakov Guthrie in Treatment: 8 Vital Signs Height(in): 21 Pulse(bpm): 48 Weight(lbs): 185 Blood Pressure(mmHg): 92/59 Body Mass Index(BMI): 27 Temperature(F): 98.6 Respiratory Rate(breaths/min): 18 Photos: [N/A:N/A] Wound Location: Left, Midline, Plantar Foot N/A N/A Wounding Event: Gradually Appeared N/A N/A Primary Etiology: Pressure Ulcer N/A N/A Comorbid History: Chronic sinus problems/congestion, N/A N/A Middle ear problems, Anemia, Chronic Obstructive Pulmonary Disease (COPD), Congestive Heart Failure, Type II Diabetes, End Stage Renal Disease, History of pressure wounds, Neuropathy Date Acquired: 08/07/2020 N/A N/A Weeks of Treatment: 8 N/A N/A Wound Status: Open N/A N/A Measurements L x W x D (cm) 1.7x1.9x0.7 N/A N/A Area (cm) : 2.537 N/A N/A Volume (cm) : 1.776 N/A N/A % Reduction in Area: 5.00% N/A N/A % Reduction in Volume: -33.00% N/A N/A Starting Position 1 (o'clock): 3 Ending Position 1 (o'clock): 7 Maximum Distance 1 (cm): 0.4 Undermining: Yes N/A N/A Classification: Category/Stage II N/A N/A Exudate Amount: Medium N/A N/A Exudate Type: Serosanguineous N/A N/A Exudate Color: red, brown N/A N/A Wound Margin: Thickened N/A N/A Granulation Amount: Large (67-100%) N/A N/A Granulation Quality: Pink N/A N/A Necrotic Amount: Small (1-33%) N/A N/A Exposed Structures: Fat Layer (Subcutaneous Tissue): N/A N/A Yes Fascia: No Tendon: No Muscle: No Joint: No Bone: No Epithelialization: None N/A N/A Debridement: Debridement - Excisional N/A N/A Pre-procedure Verification/Time 13:19 N/A N/A Out Taken: Tissue Debrided:  Subcutaneous N/A N/A Level: Skin/Subcutaneous Tissue N/A N/A Older, [name] ([id number]) Debridement Area (sq cm): 3.23 N/A N/A Instrument: Curette N/A N/A Bleeding: Minimum N/A N/A Hemostasis Achieved: Pressure N/A N/A Debridement Treatment Procedure was tolerated well N/A N/A Response: Post Debridement 1.7x1.9x0.7 N/A N/A Measurements L x W x D (cm) Post Debridement Volume: 1.776 N/A N/A (cm) Post Debridement Stage: Category/Stage III N/A N/A Procedures Performed: Debridement N/A N/A Treatment Notes Wound #7 (Foot) Wound Laterality: Plantar, Left, Midline Cleanser Soap and Water Discharge Instruction: Gently cleanse wound with antibacterial soap, rinse and pat dry prior to dressing wounds Peri-Wound Care Desitin Maximum Strength Ointment 4 (oz) Discharge Instruction: peri wound Topical Gentamicin Discharge Instruction: Apply to wound bed and tunnel area under the dressing Primary Dressing  Silvercel Small 2x2 (in/in) Discharge Instruction: Apply to wound bed Secondary Dressing ABD Pad 5x9 (in/in) Discharge Instruction: Cover with ABD pad on top of dressing Gauze Discharge Instruction: To hold dressing in place Secured With Kerlix Roll Sterile or Non-Sterile 6-ply 4.5x4 (yd/yd) Discharge Instruction: Apply Kerlix as directed Compression Wrap Compression Stockings Add-Ons Electronic Signature(s) Signed: 05/19/2021 1:38:21 PM By: Kalman Shan DO Entered By: Kalman Shan on 05/19/2021 13:31:40 [name] ([id number]) -------------------------------------------------------------------------------- [place] Details Patient Name: Heather Dear A. Date of Service: 05/19/2021 1:00 PM Medical Record Number: [id number] Patient Account Number: [id number] Date of Birth/Sex: 1975-12-14 (45 y.o. F) Treating RN: Dolan Amen Primary Care Kaiden Pech: Raelene Bott Other Clinician: Referring Mitra Duling: Raelene Bott Treating Lundon Rosier/Extender:  Yaakov Guthrie in Treatment: 8 Active Inactive Wound/Skin Impairment Nursing Diagnoses: Knowledge deficit related to ulceration/compromised skin integrity Goals: Patient/caregiver will verbalize understanding of skin care regimen Date Initiated: 03/19/2021 Date Inactivated: 04/28/2021 Target Resolution Date: 04/19/2021 Goal Status: Met Ulcer/skin breakdown will have a volume reduction of 30% by week 4 Date Initiated: 03/19/2021 Date Inactivated: 04/28/2021 Target Resolution Date: 04/19/2021 Goal Status: Unmet Unmet Reason: con't tx Ulcer/skin breakdown will have a volume reduction of 50% by week 8 Date Initiated: 03/19/2021 Target Resolution Date: 05/19/2021 Goal Status: Active Ulcer/skin breakdown will have a volume reduction of 80% by week 12 Date Initiated: 03/19/2021 Target Resolution Date: 06/19/2021 Goal Status: Active Ulcer/skin breakdown will heal within 14 weeks Date Initiated: 03/19/2021 Target Resolution Date: 07/20/2021 Goal Status: Active Interventions: Assess patient/caregiver ability to obtain necessary supplies Assess patient/caregiver ability to perform ulcer/skin care regimen upon admission and as needed Assess ulceration(s) every visit Notes: Electronic Signature(s) Signed: 05/19/2021 4:59:39 PM By: Dolan Amen RN Entered By: Dolan Amen on 05/19/2021 13:16:42 Cupps, [name] ([id number]) -------------------------------------------------------------------------------- Pain Assessment Details Patient Name: Heather Dear A. Date of Service: 05/19/2021 1:00 PM Medical Record Number: [id number] Patient Account Number: [id number] Date of Birth/Sex: 12/29/1975 (46 y.o. F) Treating RN: Dolan Amen Primary Care Trevyn Lumpkin: Raelene Bott Other Clinician: Referring Rashod Gougeon: Raelene Bott Treating Tran Randle/Extender: Yaakov Guthrie in Treatment: 8 Active Problems Location of Pain Severity and Description of Pain Patient Has Paino Yes Site  Locations Pain Location: Pain in Ulcers Rate the pain. Current Pain Level: 8 Pain Management and Medication Current Pain Management: Electronic Signature(s) Signed: 05/19/2021 4:59:39 PM By: Dolan Amen RN Entered By: Dolan Amen on 05/19/2021 13:01:55 [name] ([id number]) -------------------------------------------------------------------------------- Patient/Caregiver Education Details Patient Name: Heather Dear A. Date of Service: 05/19/2021 1:00 PM Medical Record Number: [id number] Patient Account Number: [id number] Date of Birth/Gender: 07-Oct-1976 (45 y.o. F) Treating RN: Dolan Amen Primary Care Physician: Raelene Bott Other Clinician: Referring Physician: Raelene Bott Treating Physician/Extender: Yaakov Guthrie in Treatment: 8 Education Assessment Education Provided To: Patient Education Topics Provided Wound/Skin Impairment: Methods: Explain/Verbal Responses: State content correctly Electronic Signature(s) Signed: 05/19/2021 4:59:39 PM By: Dolan Amen RN Entered By: Dolan Amen on 05/19/2021 13:30:12 [name] ([id number]) -------------------------------------------------------------------------------- Wound Assessment Details Patient Name: Heather Dear A. Date of Service: 05/19/2021 1:00 PM Medical Record Number: [id number] Patient Account Number: [id number] Date of Birth/Sex: 28-Jun-1976 (45 y.o. F) Treating RN: Dolan Amen Primary Care Shylyn Younce: Raelene Bott Other Clinician: Referring Jarreau Callanan: Raelene Bott Treating Aarion Kittrell/Extender: Yaakov Guthrie in Treatment: 8 Wound Status Wound Number: 7 Primary Pressure Ulcer Etiology: Wound Location: Left, Midline, Plantar Foot Wound Open Wounding Event: Gradually Appeared Status: Date Acquired: 08/07/2020 Comorbid Chronic sinus problems/congestion, Middle ear problems, Weeks Of Treatment: 8 History:  Anemia, Chronic Obstructive Pulmonary Disease  (COPD), Clustered Wound: No Congestive Heart Failure, Type II Diabetes, End Stage Renal Disease, History of pressure wounds, Neuropathy Photos Wound Measurements Length: (cm) 1.7 % Width: (cm) 1.9 % Depth: (cm) 0.7 Ep Area: (cm) 2.537 T Volume: (cm) 1.776 U Reduction in Area: 5% Reduction in Volume: -33% ithelialization: None unneling: No ndermining: Yes Starting Position (o'clock): 3 Ending Position (o'clock): 7 Maximum Distance: (cm) 0.4 Wound Description Classification: Category/Stage II F Wound Margin: Thickened S Exudate Amount: Medium Exudate Type: Serosanguineous Exudate Color: red, brown oul Odor After Cleansing: No lough/Fibrino Yes Wound Bed Granulation Amount: Large (67-100%) Exposed Structure Granulation Quality: Pink Fascia Exposed: No Necrotic Amount: Small (1-33%) Fat Layer (Subcutaneous Tissue) Exposed: Yes Necrotic Quality: Adherent Slough Tendon Exposed: No Muscle Exposed: No Joint Exposed: No Bone Exposed: No Treatment Notes Wound #7 (Foot) Wound Laterality: Plantar, Left, Midline Mikula, [name] ([id number]) Cleanser Soap and Water Discharge Instruction: Gently cleanse wound with antibacterial soap, rinse and pat dry prior to dressing wounds Peri-Wound Care Desitin Maximum Strength Ointment 4 (oz) Discharge Instruction: peri wound Topical Gentamicin Discharge Instruction: Apply to wound bed and tunnel area under the dressing Primary Dressing Silvercel Small 2x2 (in/in) Discharge Instruction: Apply to wound bed Secondary Dressing ABD Pad 5x9 (in/in) Discharge Instruction: Cover with ABD pad on top of dressing Gauze Discharge Instruction: To hold dressing in place Secured With Kerlix Roll Sterile or Non-Sterile 6-ply 4.5x4 (yd/yd) Discharge Instruction: Apply Kerlix as directed Compression Wrap Compression Stockings Add-Ons Electronic Signature(s) Signed: 05/19/2021 4:59:39 PM By: Dolan Amen RN Entered By: Dolan Amen on  05/19/2021 13:08:27 [name] ([id number]) -------------------------------------------------------------------------------- Vitals Details Patient Name: Heather Dear A. Date of Service: 05/19/2021 1:00 PM Medical Record Number: [id number] Patient Account Number: [id number] Date of Birth/Sex: 02-07-1976 (45 y.o. F) Treating RN: Dolan Amen Primary Care Jeri Jeanbaptiste: Raelene Bott Other Clinician: Referring Tenae Graziosi: Raelene Bott Treating Massie Mees/Extender: Yaakov Guthrie in Treatment: 8 Vital Signs Time Taken: 13:01 Temperature (F): 98.6 Height (in): 69 Pulse (bpm): 78 Weight (lbs): 185 Respiratory Rate (breaths/min): 18 Body Mass Index (BMI): 27.3 Blood Pressure (mmHg): 92/59 Reference Range: 80 - 120 mg / dl Electronic Signature(s) Signed: 05/19/2021 4:59:39 PM By: Dolan Amen RN Entered By: Dolan Amen on 05/19/2021 13:01:39

## 2021-05-20 NOTE — Progress Notes (Signed)
[name], [name] ([id number]) Visit Report for 05/19/2021 Chief Complaint Document Details Patient Name: [name], [name] Date of Service: 05/19/2021 1:00 PM Medical Record Number: [id number] Patient Account Number: [id number] Date of Birth/Sex: 11/27/1975 (45 y.o. F) Treating RN: Cornell Barman Primary Care Provider: Raelene Bott Other Clinician: Referring Provider: Raelene Bott Treating Provider/Extender: Yaakov Guthrie in Treatment: 8 Information Obtained from: Patient Chief Complaint Left great toe ulcer 03/19/2021; patient referred by Dr. Luana Shu who has been looking after her left foot for quite a period of time for review of a nonhealing area in the left midfoot Electronic Signature(s) Signed: 05/19/2021 1:38:21 PM By: Kalman Shan DO Entered By: Kalman Shan on 05/19/2021 13:31:57 Luhn, [name] ([id number]) -------------------------------------------------------------------------------- Debridement Details Patient Name: Heather Dear A. Date of Service: 05/19/2021 1:00 PM Medical Record Number: [id number] Patient Account Number: [id number] Date of Birth/Sex: 1975/11/19 (45 y.o. F) Treating RN: Dolan Amen Primary Care Provider: Raelene Bott Other Clinician: Referring Provider: Raelene Bott Treating Provider/Extender: Yaakov Guthrie in Treatment: 8 Debridement Performed for Wound #7 Left,Midline,Plantar Foot Assessment: Performed By: Physician Kalman Shan, MD Debridement Type: Debridement Level of Consciousness (Pre- Awake and Alert procedure): Pre-procedure Verification/Time Out Yes - 13:19 Taken: Start Time: 13:19 Total Area Debrided (L x W): 1.7 (cm) x 1.9 (cm) = 3.23 (cm) Tissue and other material Viable, Subcutaneous debrided: Level: Skin/Subcutaneous Tissue Debridement Description: Excisional Instrument: Curette Bleeding: Minimum Hemostasis Achieved: Pressure Response to Treatment: Procedure was tolerated well Level of  Consciousness (Post- Awake and Alert procedure): Post Debridement Measurements of Total Wound Length: (cm) 1.7 Stage: Category/Stage III Width: (cm) 1.9 Depth: (cm) 0.7 Volume: (cm) 1.776 Character of Wound/Ulcer Post Debridement: Stable Post Procedure Diagnosis Same as Pre-procedure Electronic Signature(s) Signed: 05/19/2021 1:38:21 PM By: Kalman Shan DO Signed: 05/19/2021 4:59:39 PM By: Dolan Amen RN Entered By: Dolan Amen on 05/19/2021 13:20:44 [name], [name] ([id number]) -------------------------------------------------------------------------------- HPI Details Patient Name: Heather Dear A. Date of Service: 05/19/2021 1:00 PM Medical Record Number: [id number] Patient Account Number: [id number] Date of Birth/Sex: [birth year] (45 y.o. F) Treating RN: Cornell Barman Primary Care Provider: Raelene Bott Other Clinician: Referring Provider: Raelene Bott Treating Provider/Extender: Yaakov Guthrie in Treatment: 8 History of Present Illness HPI Description: 01/18/18-She is here for initial evaluation of the left great toe ulcer. She is a poor historian in regards to timeframe in detail. She states approximately 4 weeks ago she lacerated her toe on something in the house. She followed up with her primary care who placed her on Bactrim and ultimately a second dose of Bactrim prior to coming to wound clinic. She states she has been treating the toe with peroxide, Betadine and a Band-Aid. She did not check her blood sugar this morning but checked it yesterday morning it was 327; she is unaware of a recent A1c and there are no current records. She saw Dr. she would've orthopedics last week for an old injury to the left ankle, she states he did not see her toe, nor did she bring it to his attention. She smokes approximately 1 pack cigarettes a day. Her social situation is concerning, she arrives this morning with her mother who appears extremely intoxicated/under the  influence; her mother was asked to leave the room and be monitored by the patient's grandmother. The patient's aunt then accompanied the patient and the room throughout the rest of the appointment. We had a lengthy discussion regarding the deleterious effects of uncontrolled hyperglycemia and smoking as it relates to wound healing and overall  health. She was strongly encouraged to decrease her smoking and get her diabetes under better control. She states she is currently on a diet and has cut down her [facility] consumption. The left toe is erythematous, macerated and slightly edematous with malodor present. The edema in her left foot is below her baseline, there is no erythema streaking. We will treat her with Santyl, doxycycline; we have ordered and xray, culture and provided a Peg assist surgical shoe and cultured the wound. 01/25/18-She is here in follow-up evaluation for a left great toe ulcer and presents with an abscess to her suprapubic area. She states her blood sugars remain elevated, feeling "sick" and if levels are below 250, but she is trying. She has made no attempt to decrease her smoking stating that we "can't take away her food in her cigarettes". She has been compliant with offloading using the PEG assist you. She is using Santyl daily. the culture obtained last week grew staph aureus and Enterococcus faecalis; continues on the doxycycline and Augmentin was added on Monday. The suprapubic area has erythema, no femoral variation, purple discoloration, minimal induration, was accessed with a cotton tip applicator with sanguinopurulent drainage, this was cultured, I suspect the current antibiotic treatment will cover and we will not add anything to her current treatment plan. She was advised to go to urgent care or ER with any change in redness, induration or fever. 02/01/18-She is here in follow-up evaluation for left great toe ulcers and a new abdominal abscess from last week. She was  able to use packing until earlier this week, where she "forgot it was there". She states she was feeling ill with GI symptoms last week and was not taking her antibiotic. She states her glucose levels have been predominantly less than 200, with occasional levels between 200-250. She thinks this was contributing to her GI symptoms as they have resolved without intervention. There continues to be significant laceration to left toe, otherwise it clinically looks stable/improved. There is now less superficial opening to the lateral aspect of the great toe that was residual blister. We will transition to [facility] to all wounds, she will continue her Augmentin. If there is no change or deterioration next week for reculture. 02/08/18-She is here in follow-up evaluation for left great toe ulcer and abdominal ulcer. There is an improvement in both wounds. She has been wrapping her left toe with coban, not by our direction, which has created an area of discoloration to the medial aspect; she has been advised to NOT use coban secondary to her neuropathy. She states her glucose levels have been high over this last week ranging from 200-350, she continues to smoke. She admits to being less compliant with her offloading shoe. We will continue with same treatment plan and she will follow-up next week. 02/15/18-She is here in follow-up evaluation for left great toe ulcer and abdominal ulcer. The abdominal ulcer is epithelialized. The left great toe ulcer is improved and all injury from last week using the Coban wrap is resolved, the lateral ulcer is healed. She admits to noncompliance with wearing offloading shoe and admits to glucose levels being greater than 300 most of the week. She continues to smoke and expresses no desire to quit. There is one area medially that probes deeper than it has historically, erythema to the toe and dorsal foot has consistently waxed and waned. There is no overt signs of cellulitis  or infection but we will culture the wound for any occult infection  given the new area of depth and erythema. We will hold off on sensitivities for initiation of antibiotic therapy. 02/22/18-She is here in follow up evaluation for left great toe ulcer. There is overall significant improvement in both wound appearance, erythema and edema with changes made last week. She was not initiated on antibiotic therapy. Culture obtained last week showed oxacillin sensitive staph aureus, sensitive to clindamycin. Clindamycin has been called into the pharmacy but she has been instructed to hold off on initiation secondary to overall clinical improvement and her history of antibiotic intolerance. She has been instructed to contact the clinic with any noted changes/deterioration and the wound, erythema, edema and/or pain. She will follow-up next week. She continues to smoke and her glucose levels remain elevated >250; she admits to compliance with offloading shoe 03/01/18 on evaluation today patient appears to be doing fairly well in regard to her left first toe ulcer. She has been tolerating the dressing changes with the [facility] without complication and overall this has definitely showed signs of improvement according to records as well is what the patient tells me today. I'm very pleased in that regard. She is having no pain today 03/08/18 She is here for follow up evaluation of a left great toe ulcer. She remains non-compliant with glucose control and smoking cessation; glucose levels consistently >200. She states that she got new shoe inserts/peg assist. She admits to compliance with offloading. Since my last evaluation there is significant improvement. We will switch to prisma at this time and she will follow up next week. She is noted to be tachycardic at this appointment, heart rate 120s; she has a history of heart rate 70-130 according to our records. She admits to extreme agitation r/t personal  issues; she was advised to monitor her heartrate and contact her physician if it does not return to a more normal range (<100). She takes cardizem twice daily. 03/15/18-She is here in follow-up evaluation for left great toe ulcer. She remains noncompliant with glucose control and smoking cessation. She admits to compliance with wearing offloading shoe. The ulcer is improved/stable and we will continue with the same treatment plan and she will follow-up next week 03/22/18-She is here for evaluation for left great toe ulcer. There continues to be significant improvement despite recurrent hyperglycemia (over 500 yesterday) and she continues to smoke. She has been compliant with offloading and we will continue with same treatment plan and she will follow-up next week. 03/29/18-She is here for evaluation for left great toe ulcer. Despite continuing to smoke and uncontrolled diabetes she continues to improve. She is compliant with offloading shoe. We will continue with the same treatment plan and she will follow-up next week 04/05/18- She is here in follow up evaluation for a left great toe ulcer; she presents with small pustule to left fifth toe (resembles ant bite). She admits to compliance with wearing offloading shoe; continues to smoke or have uncontrolled blood glucose control. There is more callus than usual with evidence of bleeding; she denies known trauma. 04/12/18-She is here for evaluation of left great toe ulcer. Despite noncompliance with glycemic control and smoking she continues to make [name], [name] ([id number]) improvement. She continues to wear offloading shoe. The pustule, that was identified last week, to the left fifth toe is resolved. She will follow-up in 2 weeks 05/03/18-she is seen in follow-up evaluation for a left great toe ulcer. She is compliant with offloading, otherwise noncompliant with glycemic control and smoking. She has plateaued and  there is minimal improvement noted. We  will transition to [facility], replaced the insert to her surgical shoe and she will follow-up in one week 05/10/18- She is here in follow up evaluation for a left great toe ulcer. It appears stable despite measurement change. We will continue with same treatment plan and follow up next week. 05/24/18-She is seen in follow-up evaluation for a left great toe ulcer. She remains compliant with offloading, has made significant improvement in her diet, decreasing the amount of sugar/soda. She said her recent A1c was 10.9 which is lower than. She did see a diabetic nutritionist/educator yesterday. She continues to smoke. We will continue with the same treatment plan and she'll follow-up next week. 05/31/18- She is seen in follow-up evaluation for left great toe ulcer. She continues to remain compliant with offloading, continues to make improvement in her diet, increasing her water and decreasing the amount of sugar/soda. She does continue to smoke with no desire to quit. We will apply Prisma to the depth and Hydrofera Blue over. We have not received insurance authorization for oasis. She will follow up next week. 06/07/18-She is seen in follow-up evaluation for left great toe ulcer. It has stalled according to today's measurements although base appears stable. She says she saw a diabetic educator yesterday; her average blood sugars are less than 300 which is an improvement for her. She continues to smoke and states "that's my next step" She continues with water over soda. We will order for xray, culture and reinstate ace wrap compression prior to placing apligraf for next week. She is voicing no complaints or concerns. Her dressing will change to iodoflex over the next week in preparation for apligraf. 06/14/18-She is seen in follow-up evaluation for left great toe ulcer. Plain film x-ray performed last week was negative for osteomyelitis. Wound culture obtained last week grew strep B and OSSA; she is  initiated on keflex and cefdinir today; there is erythema to the toe which could be from ace wrap compression, she has a history of wrapping too tight and has has been encouraged to maintain ace wraps that we place today. We will hold off on application of apligraf today, will apply next week after antibiotic therapy has been initiated. She admits today that she has resumed taking a shower with her foot/toe submerged in water, she has been reminded to keep foot/toe out of the bath water. She will be seen in follow up next week 06/21/18-she is seen in follow-up evaluation for left great toe ulcer. She is tolerating antibiotic therapy with no GI disturbance. The wound is stable. Apligraf was applied today. She has been decreasing her smoking, only had 4 cigarettes yesterday and 1 today. She continues being more compliant in diabetic diet. She will follow-up next week for evaluation of site, if stable will remove at 2 weeks. 06/28/18- She is here in follow up evalution. Apligraf was placed last week, she states the dressing fell off on Tuesday and she was dressing with hydrofera blue. She is healed and will be discharged from the clinic today. She has been instructed to continue with smoking cessation, continue monitoring glucose levels, offloading for an additional 4 weeks and continue with hydrofera blue for additional two weeks for any possible microscopic opening. Readmission: 08/07/18 on evaluation today patient presents for reevaluation concerning the ulcer of her right great toe. She was previously discharged on 06/28/18 healed. Nonetheless she states that this began to show signs of drainage she subsequently went to her primary  care provider. Subsequently an x-ray was performed on 08/01/18 which was negative. The patient was also placed on antibiotics at that time. Fortunately they should have been effective for the infection. Nonetheless she's been experiencing some improvement but still has a lot of  drainage coming from the wound itself. 08/14/18 on evaluation today patient's wound actually does show signs of improvement in regard to the erythema at this point. She has completed the antibiotics. With that being said we did discuss the possibility of placing her in a total contact cast as of today although I think that I may want to give this just a little bit more time to ensure nothing recurrence as far as her infection is concerned. I do not want to put in the cast and risk infection at that time if things are not completely resolved. With that being said she is gonna require some debridement today. 08/21/18 on evaluation today patient actually appears to be doing okay in regard to her toe ulcer. She's been tolerating the dressing changes without complication. With that being said it does appear that she is ready and in fact I think it's appropriate for [place] to go ahead and initiate the total contact cast today. Nonetheless she will require some sharp debridement to prepare the wound for application. Overall I feel like things have been progressing well but we do need to do something to get this to close more readily. 08/24/18 patient seen today for reevaluation after having had the total contact cast applied on Tuesday. She seems to have done very well the wound appears to be doing great and overall I'm pleased with the progress that she's made. There were no abnormal areas of rubbing from the cast on her lower extremity. 08/30/18 on evaluation today patient actually appears to be completely healed in regard to her plantar toe ulcer. She tells me at this point she's been having a lot of issues with the cast. She almost fell a couple of times the state shall the step of her dog a couple times as well. This is been a very frustrating process for her other nonetheless she has completely healed the wound which is excellent news. Overall there does not appear to be the evidence of infection at this  time which is great news. 09/11/18 evaluation today patient presents for follow-up concerning her great toe ulcer on the left which has unfortunately reopened since I last saw her which was only a couple of weeks ago. Unfortunately she was not able to get in to get the shoe and potentially the AFO that's gonna be necessary due to her left foot drop. She continues with offloading shoe but this is not enough to prevent her from reopening it appears. When we last had her in the total contact cast she did well from a healing standpoint but unfortunately the wound reopened as soon as she came out of the cast within just a couple of weeks. 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 This is also part of the reason why she falls on a regular basis. I just do not believe that is gonna be safe for [place] to reinitiate the total contact cast as last time we had this on she fell 3 times one week which is definitely not  normal for her. 09/18/18 upon evaluation today the patient actually appears to be doing about the same in regard to her toe ulcer. She did not contact [employer] as I asked her to even though I had given her the prescription. In fact she actually states that she has no idea where the prescription is. She did apparently call [employer] and they told her that all she needed to do was bring the prescription in order to be able to be seen and work on getting the AFO for her left foot. With all that being said she still does not have an appointment and I'm not sure were things stand that regard. I will give her a new prescription today in order to contact them to get this set up. 09/25/18 on evaluation today patient actually appears to be doing about the same in regard to her toes ulcer. She does have a small areas  which seems to have a lot of callous buildup around the edge of the wound which is going to need sharp debridement today. She still is waiting to be scheduled for evaluation with [employer] for possibility of an AFO. She states there supposed to call her tomorrow to get this set up. Unfortunately it does appear that her foot specifically the toe area is showing signs of erythema. There does not appear to be any systemic infection which is in these good news. [name], [name] ([id number]) 10/02/18 on evaluation today patient actually appears to be doing about the same in regard to her toe ulcer. This really has not done too well although it's not significantly larger it's also not significantly smaller. She has been tolerating the dressing changes without complication. She actually has her appointment with [facility] and [place] tomorrow to hopefully be measured for obtaining and AFO splint. I think this would be helpful preventing this from reoccurring. We had contemplated starting the cast this week although to be honest I am reluctant to do that as she's been having nausea, vomiting, and seizure activity over the past three days. She has a history of seizures and have been told is nothing that can be done for these. With that being said I do believe that along with the seizures have the nausea vomiting which upon further questioning doesn't seem to be the normal for her and makes me concerned for the possibility of infection or something else going on. I discussed this with the patient and her mother during the office visit today. I do not feel the wound is effective but maybe something else. The responses this was "this just happens to her at times and we don't know why". They did not seem to be interested in going to the hospital to have this checked out further. 10/09/18 on evaluation today patient presents for follow-up concerning her ongoing toe ulcer. She has been tolerating the dressing  changes without complication. Fortunately there does not appear to be any evidence of infection which is great news however I do think that the patient would benefit from going ahead for with the total contact cast. She's actually in a wheelchair today she tells me that she will use her walker if we initiate the cast. I was very specific about the fact that if we were gonna do the cast I wanted to make sure that she was using the walker in order to prevent any falls. She tells me she does not have stairs that she has to traverse on a regular basis at her home. She has  not had any seizures since last week again that something that happens to her often she tells me she did talk to [employer] and they said that it may take up to three weeks to get the brace approved for her. Hopefully that will not take that long but nonetheless in the meantime I do think the cast could be of benefit. 10/12/18 on evaluation today patient appears to be doing rather well in regard to her toe ulcer. It's just been a few days and already this is significantly improved both as far as overall appearance and size. Fortunately there's no sign of infection. She is here for her first obligatory cast change. 10/19/18 Seen today for follow up and management of left great toe ulcer. Wound continues to show improvement. Noted small open area with seroussang drainage with palpation. Denies any increased pain or recent fevers during visit. She will continue calcium alginate with offloading shoe. Denies any questions or concerns during visit. 10/26/18 on evaluation today patient appears to be doing about the same as when I last saw her in regard to her wound bed. Fortunately there does not appear to be any signs of infection. Unfortunately she continues to have a breakdown in regard to the toe region any time that she is not in the cast. It takes almost no time at all for this to happen. Nonetheless she still has not heard anything from the  brace being made by [employer] as to when exactly this will be available to her. Fortunately there is no signs of infection at this time. 10/30/18 on evaluation today patient presents for application of the total contact cast as we just received him this morning. Fortunately we are gonna be able to apply this to her today which is great news. She continues to have no significant pain which is good news. Overall I do feel like things have been improving while she was the cast is when she doesn't have a cast that things get worse. She still has not really heard anything from [place] regarding her brace. 11/02/18 upon evaluation today patient's wound already appears to be doing significantly better which is good news. Fortunately there does not appear to be any signs of infection also good news. Overall I do think the total contact cast as before is helping to heal this area unfortunately it's just not gonna likely keep the area closed and healed without her getting her brace at least. Again the foot drop is a significant issue for her. 11/09/18 on evaluation today patient appears to be doing excellent in regard to her toe ulcer which in fact is completely healed. Fortunately we finally got the situation squared away with the paperwork which was needed to proceed with getting her brace approved by Medicaid. I have filled that out unfortunately that information has been sent to the orthopedic office that I worked at 2 1/2 years ago and not tired Current wound care measures. Fortunately she seems to be doing very well at this time. 11/23/18 on evaluation today patient appears to be doing More Poorly Compared to Last Time I Saw Her. At [facility] She Had Completely Healed. Currently she is continuing to have issues with reopening. She states that she just found out that the brace was approved through Medicaid now she just has to go get measured in order to have this fitted for her and then made. Subsequently she  does not have an appointment for this yet that is going to complicate things we obviously  cannot put her back in the cast if we do not have everything measured because they're not gonna be able to measure her foot while she is in the cast. 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 Obviously this is unfortunate and does have me somewhat worried as well. 11/30/18 on evaluation today patient's toe ulcer actually appears to be doing fairly well. The good news is she will be getting her brace in the shoes next week on Wednesday. Hopefully we will be able to get this to heal without having to go back in the cast however she may need the cast in order to get the wound completely heal and then go from there. Fortunately there's no signs of infection at this time. 12/07/18 on evaluation today patient fortunately did receive her brace and she states she could tell this definitely makes her walk better. With that being said she's been having issues with her toe where she noticed yesterday there was a lot of tissue that was loosing off this appears to be much larger than what it was previous. She also states that her leg has been read putting much across the top of her foot just about the ankle although this seems to be receiving somewhat. The total area is still red and appears to be someone infected as best I can tell. She is previously taken Bactrim and that may be a good option for her today as well. We are gonna see what I wound culture shows as well and I think that this is definitely appropriate. With that being said outside of the culture I still need to initiate something in the interim and that's what I'm gonna go ahead and select Bactrim is a good option for her. 12/14/18 on evaluation today patient appears to be doing better in regard to her left great toe ulcer as compared to last week's evaluation.  There's still some erythema although this is significantly improved which is excellent news. Overall I do believe that she is making good progress is still gonna take some time before she is where I would like her to be from the standpoint of being able to place her back into the total contact cast. Hopefully we will be where we need to be by next week. 12/21/18 on evaluation today patient actually appears to be doing poorly in regard to her toe ulcer. She's been tolerating the dressing changes without complication. Fortunately there's no signs of systemic infection although she does have a lot of drainage from the toe ulcer and this does seem to be causing some issues at this point. She does have erythema on the distal portion of her toe that appears to be likely cellulitis. 12/28/18 on evaluation today patient actually appears to be doing a little better in my pinion in regard to her toe ulcer. With that being said she still does have some evidence of infection at this time and for her culture she had both E. coli as well as enterococcus as organisms noted on evaluation. For that reason I think that though the Keflex likely has treated the E. coli rather well this has really done nothing for the enterococcus. We are going to have to initiate treatment for this specifically. [name], [name] ([id number]) 01/04/19 on evaluation today patient's toe actually appears to be doing better from the standpoint of infection. She currently would like to see  about putting the cash back on I think that this is appropriate as long as she takes care of it and keeps it from getting wet. She is gonna have some drainage we can definitely pass this up with Drawtex and alginate to try to prevent as much drainage as possible from causing the problems. With that being said I do want to at least try her with the cast between now and Tuesday. If there any issues we can't continue to use it then I will discontinue the use of  the cast at that point. 01/08/19 on evaluation today patient actually appears to be doing very well as far as her foot ulcer specifically the great toe on the left is concerned. She did have an area of rubbing on the medial aspect of her left ankle which again is from the cast. Fortunately there's no signs of infection at this point in this appears to be a very slight skin breakdown. The patient tells me she felt it rubbing but didn't think it was that bad. Fortunately there is no signs of active infection at this time which is good news. No fevers, chills, nausea, or vomiting noted at this time. 01/15/19 on evaluation today patient actually appears to be doing well in regard to her toe ulcer. Again as previous she seems to do well and she has the cast on which indicates to me that during the time she doesn't have a cast on she's putting way too much pressure on this region. Obviously I think that's gonna be an issue as with the current national emergency concerning the Covid-19 Virus it has been recommended that we discontinue the use of total contact casting by the chief medical officer of our company, Dr. Simona Huh. The reasoning is that if a patient becomes sick and cannot come into have the cast removed they could not just leave this on for an additional two weeks. Obviously the hospitals also do not want to receive patient's who are sick into the emergency department to potentially contaminate the region and spread the Covid-19 Virus among other sick individuals within the hospital system. Therefore at this point we are suspending the use of total contact cast until the current emergency subsides. This was all discussed with the patient today as well. 01/22/19 on evaluation today patient's wound on her left great toe appears to be doing slightly worse than previously noted last week. She tells me that she has been on this quite a bit in fact she tells me she's been awake for 38 straight hours. This is  due to the fact that she's having to care for grandparents because nobody else will. She has been taking care of them for five the last seven days since I've seen her they both have dementia his is from a stroke and her grandmother's was progressive. Nonetheless she states even her mom who knows her condition and situation has only help two of those days to take care of them she's been taking care of the rest. Fortunately there does not appear to be any signs of active infection in regard to her toe at this point although obviously it doesn't look as good as it did previous. I think this is directly related to her not taking off the pressure and friction by way of taking things easy. Though I completely understand what's going on. 01/29/19 on evaluation today patient's tools are actually appears to be showing some signs of improvement today compared to last week's evaluation as far  as not necessarily the overall size of the wound but the fact that she has some new skin growth in between the two ends of the wound opening. Overall I feel like she has done well she states that she had a family member give her what sounds to be a CAM walker boot which has been helpful as well. 02/05/19 on evaluation today patient's wound bed actually appears to be doing significantly better in regard to her overall appearance of the size of the wound. With that being said she is still having an issue with offloading efficiently enough to get this to close. Apparently there is some signs of infection at this point as well unfortunately. Previously she's done well of Augmentin I really do not see anything that needs to be culture currently but there theme and cellulitis of the foot that I'm seeing I'm gonna go ahead and place her on an antibiotic today to try to help clear this up. 02/12/2019 on evaluation today patient actually appears to be doing poorly in regard to her overall wound status. She tells me she has been  using her offloading shoe but actually comes in today wearing her tennis shoe with the AFO brace. Again as I previously discussed with her this is really not sufficient to allow the area to heal appropriately. Nonetheless she continues to be somewhat noncompliant and I do wonder based on what she has told my nurse in the past as to whether or not a good portion of this noncompliance may be recreational drug and alcohol related. She has had a history of heroin overdose and this was fairly recently in the past couple of months that have been seeing her. Nonetheless overall I feel like her wound looks significantly worse today compared to what it was previous. She still has significant erythema despite the Augmentin I am not sure that this is an appropriate medication for her infection I am also concerned that the infection may have gone down into her bone. 02/19/19 on evaluation today patient actually appears to be doing about the same in regard to her toe ulcer. Unfortunately she continues to show signs of bone exposure and infection at this point. There does not appear to be any evidence of worsening of the infection but I'm also not really sure that it's getting significantly better. She is on the Augmentin which should be sufficient for the Staphylococcus aureus infection that she has at this point. With that being said she may need IV antibiotics to more appropriately treat this. We did have a discussion today about hyperbaric option therapy. 02/28/19 on evaluation today patient actually appears to be doing much worse in regard to the wound on her left great toe as compared to even my previous evaluation last week. Unfortunately this seems to be training in a pretty poor direction. Her toe was actually now starting to angle laterally and I can actually see the entire joint area of the proximal portion of the digit where is the distal portion of the digit again is no longer even in contact with the  joint line. Unfortunately there's a lot more necrotic tissue around the edge and the toe appears to be showing signs of becoming gangrenous in my pinion. I'm very concerned about were things stand at this point. She did see infectious disease and they are planning to send in a prescription for Sivextro for her and apparently this has been approved. With that being said I don't think she should avoid taking this  but at the same time I'm not sure that it's gonna be sufficient to save her toe at this point. 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 03/05/19 on evaluation today patient actually appears to be doing quite well in regard to her toe all things considering. She still has bone exposed but there appears to be much less your thing on overall the appearance of the wound and the toe itself is dramatically improved. She still does have some issues currently obviously with infection she did see vascular as well and there concerned that her blood flow to the toad. For that reason they are setting up for an angiogram next week. 03/14/19 on evaluation today patient appears to be doing very poor in regard to her toe and specifically in regard to the ulceration and the fact that she's starting to notice the toe was leaning even more towards the lateral aspect and the complete joint is visible on the proximal aspect of the joint. Nonetheless she's also noted a significant odor and the tip of the toe is turning more dark and necrotic appearing. Overall I think she is getting worse not better as far as this is concerned. For that reason I am recommending at this point that she likely needs to be seen for likely amputation. READMISSION 03/19/2021 This is a patient that we cared for in this clinic for a  prolonged period of time in 2019 and 2020 with a left foot and left first toe wound. 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 ([id number]) 04/09/20 by Dr. Luana Shu. In December 2021 she had an ulcer on her right great toe as well as the fourth and fifth toes. 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 She also had an angiogram at that time and underwent angioplasty of the right anterior tibial artery. In any case she claims that the wound on the right foot is closed I did not look at this today which was probably an oversight although I think that should be done next week. After her surgery she developed a dehiscence but I do not see any follow-up of this. According to Dr. Deborra Medina last review that she was out of the area being cared for by another physician but recently came back to his attention. 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 A culture of this area showed E. coli apparently before she came back to see Dr. Luana Shu she was supposed to be receiving antibiotics but she did not really take them. Nor is she offloading this area at all. Finally her last hemoglobin A1c listed in epic was in March 2022 at 14.1 she says things are a lot better since then although I am not sure. 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 She had this wound at the time. During this admission she had E. coli sepsis a TEE was negative. Past medical history is extensive and includes type 2 diabetes with peripheral neuropathy cardiomyopathy with an ejection fraction of 33%, hypertension, hyperlipidemia chronic renal failure stage III history of substance abuse with cocaine although  she claims to be clean now verified by her mother. She is still a heavy cigarette smoker.  She has a history of bipolar disorder seizure disorder ABI in our clinic was 1.05 6/1; left midfoot in the setting of a TMA done previously. Round circular wound with a "knuckle" of protruding tissue. The problem is that the knuckle was not attached to any of the surrounding granulation and this probed proximally widely I removed a large portion of this tissue. This wound goes with considerable undermining laterally. I do not feel any bone there was no purulence but this is a deep wound. 6/8; in spite of the debridement I did last week. She arrives with a wound looking exactly the same. A protruding "knuckle" of tissue nonadherent to most of the surrounding tissue. There is considerable depth around this from 6-12 o'clock at 2.7 cm and undermining of 1 cm. This does not look overtly infected and the x-ray I did last week was negative for any osseous abnormalities. We have been using silver collagen 6/15; deep tissue culture I did last week showed moderate staph aureus and moderate Pseudomonas. This will definitely require prolonged antibiotic therapy. The pathology on the protuberant area was negative for malignancy fungus etc. 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 We have been using silver collagen. I am going to be prescribing Levaquin for 2 weeks. Her CT scan of the foot is down for 7/5 6/22; CT scan of the foot on 7 5. She says she has hardware in the left leg from her previous fracture. She is on the Levaquin for the deep tissue culture I did that showed methicillin sensitive staph aureus and Pseudomonas. I gave her a 2-week supply and she will have another week. She arrives in clinic today with the same protuberant tissue however this is nonadherent to the tissue surrounding it. I am really at a loss to explain this unless there is underlying deep tissue infection 6/29; patient presents for 1 week follow-up. She has been using  collagen to the wound bed. She reports taking her antibiotics as prescribed.She has no complaints or issues today. She denies signs of infection. 7/6; patient presents for one week followup. She has been using collagen to the wound bed. She states she is taking Levaquin however at times she is not able to keep it down. She denies signs of infection. 7/13; patient presents for 1 week follow-up. She has been using silver alginate to the wound bed. She still has nausea when taking her antibiotics. She denies signs of infection. 7/20; patient presents for 1 week follow-up. She has been using silver alginate with gentamicin cream to the wound bed. She denies any issues and has no complaints today. She denies signs of infection. 7/27; patient presents for 1 week follow-up. She continues to use silver alginate with gentamicin cream to the wound bed. She reports starting her antibiotics. She has no issues or complaints. Overall she reports stability to the wound. Electronic Signature(s) Signed: 05/19/2021 1:38:21 PM By: Kalman Shan DO Entered By: Kalman Shan on 05/19/2021 13:34:21 [name] ([id number]) -------------------------------------------------------------------------------- Physical Exam Details Patient Name: Heather Dear A. Date of Service: 05/19/2021 1:00 PM Medical Record Number: [id number] Patient Account Number: [id number] Date of Birth/Sex: Nov 04, 1975 (45 y.o. F) Treating RN: Cornell Barman Primary Care Provider: Raelene Bott Other Clinician: Referring Provider: Raelene Bott Treating Provider/Extender: Yaakov Guthrie in Treatment: 8 Constitutional . Psychiatric . Notes Left foot: Wound to the plantar  aspect that that has granulation tissue and nonviable tissue present. There is significant callus circumferentially. Electronic Signature(s) Signed: 05/19/2021 1:38:21 PM By: Kalman Shan DO Entered By: Kalman Shan on 05/19/2021 13:35:02 [name] ([id number]) -------------------------------------------------------------------------------- Physician Orders Details Patient Name: Heather Dear A. Date of Service: 05/19/2021 1:00 PM Medical Record Number: [id number] Patient Account Number: [id number] Date of Birth/Sex: [birth year] (45 y.o. F) Treating RN: Dolan Amen Primary Care Provider: Raelene Bott Other Clinician: Referring Provider: Raelene Bott Treating Provider/Extender: Yaakov Guthrie in Treatment: 8 Verbal / Phone Orders: No Diagnosis Coding Follow-up Appointments o Return Appointment in 1 week. Bathing/ Shower/ Hygiene o Clean wound with Normal Saline or wound cleanser. Off-Loading Left Lower Extremity o Open toe surgical shoe with peg assist. o Turn and reposition every 2 hours Medications-Please add to medication list. o P.O. Antibiotics - continue cipro Wound Treatment Wound #7 - Foot Wound Laterality: Plantar, Left, Midline Cleanser: Soap and Water 3 x Per Week/30 Days Discharge Instructions: Gently cleanse wound with antibacterial soap, rinse and pat dry prior to dressing wounds Peri-Wound Care: Desitin Maximum Strength Ointment 4 (oz) 3 x Per Week/30 Days Discharge Instructions: peri wound Topical: Gentamicin 3 x Per Week/30 Days Discharge Instructions: Apply to wound bed and tunnel area under the dressing Primary Dressing: Silvercel Small 2x2 (in/in) 3 x Per Week/30 Days Discharge Instructions: Apply to wound bed Secondary Dressing: ABD Pad 5x9 (in/in) 3 x Per Week/30 Days Discharge Instructions: Cover with ABD pad on top of dressing Secondary Dressing: Gauze 3 x Per Week/30 Days Discharge Instructions: To hold dressing in place Secured With: Kerlix Roll Sterile or Non-Sterile 6-ply 4.5x4 (yd/yd) 3 x Per Week/30 Days Discharge Instructions: Apply Kerlix as directed Electronic Signature(s) Signed: 05/19/2021 1:38:21 PM By: Kalman Shan DO Signed: 05/19/2021 4:59:39 PM By:  Dolan Amen RN Entered By: Dolan Amen on 05/19/2021 13:17:58 [name], [name] ([id number]) -------------------------------------------------------------------------------- Problem List Details Patient Name: Heather Dear A. Date of Service: 05/19/2021 1:00 PM Medical Record Number: [id number] Patient Account Number: [id number] Date of Birth/Sex: September 30, 1976 (45 y.o. F) Treating RN: Cornell Barman Primary Care Provider: Raelene Bott Other Clinician: Referring Provider: Raelene Bott Treating Provider/Extender: Yaakov Guthrie in Treatment: 8 Active Problems ICD-10 Encounter Code Description Active Date MDM Diagnosis E11.621 Type 2 diabetes mellitus with foot ulcer 03/19/2021 No Yes 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 03/19/2021 No Yes severity E11.42 Type 2 diabetes mellitus with diabetic polyneuropathy 03/19/2021 No Yes Inactive Problems Resolved Problems Electronic Signature(s) Signed: 05/19/2021 1:38:21 PM By: Kalman Shan DO Entered By: Kalman Shan on 05/19/2021 13:31:13 Dupriest, Daishia A. ([id number]) -------------------------------------------------------------------------------- Progress Note Details Patient Name: Heather Dear A. Date of Service: 05/19/2021 1:00 PM Medical Record Number: [id number] Patient Account Number: [id number] Date of Birth/Sex: Jan 02, 1976 (45 y.o. F) Treating RN: Cornell Barman Primary Care Provider: Raelene Bott Other Clinician: Referring Provider: Raelene Bott Treating Provider/Extender: Yaakov Guthrie in Treatment: 8 Subjective Chief Complaint Information obtained from Patient Left great toe ulcer 03/19/2021; patient referred by Dr. Luana Shu who has been looking after her left foot for quite a period of time for review of a nonhealing area in the left midfoot History of Present Illness (HPI) 01/18/18-She is here for initial evaluation of the left great toe ulcer. She is a  poor historian in regards to timeframe in detail. She states approximately 4 weeks ago she lacerated her toe on something in the house. She followed up with her primary care who placed her  on Bactrim and ultimately a second dose of Bactrim prior to coming to wound clinic. She states she has been treating the toe with peroxide, Betadine and a Band-Aid. She did not check her blood sugar this morning but checked it yesterday morning it was 327; she is unaware of a recent A1c and there are no current records. She saw Dr. she would've orthopedics last week for an old injury to the left ankle, she states he did not see her toe, nor did she bring it to his attention. She smokes approximately 1 pack cigarettes a day. Her social situation is concerning, she arrives this morning with her mother who appears extremely intoxicated/under the influence; her mother was asked to leave the room and be monitored by the patient's grandmother. The patient's aunt then accompanied the patient and the room throughout the rest of the appointment. We had a lengthy discussion regarding the deleterious effects of uncontrolled hyperglycemia and smoking as it relates to wound healing and overall health. She was strongly encouraged to decrease her smoking and get her diabetes under better control. She states she is currently on a diet and has cut down her [facility] consumption. The left toe is erythematous, macerated and slightly edematous with malodor present. The edema in her left foot is below her baseline, there is no erythema streaking. We will treat her with Santyl, doxycycline; we have ordered and xray, culture and provided a Peg assist surgical shoe and cultured the wound. 01/25/18-She is here in follow-up evaluation for a left great toe ulcer and presents with an abscess to her suprapubic area. She states her blood sugars remain elevated, feeling "sick" and if levels are below 250, but she is trying. She has made no  attempt to decrease her smoking stating that we "can't take away her food in her cigarettes". She has been compliant with offloading using the PEG assist you. She is using Santyl daily. the culture obtained last week grew staph aureus and Enterococcus faecalis; continues on the doxycycline and Augmentin was added on Monday. The suprapubic area has erythema, no femoral variation, purple discoloration, minimal induration, was accessed with a cotton tip applicator with sanguinopurulent drainage, this was cultured, I suspect the current antibiotic treatment will cover and we will not add anything to her current treatment plan. She was advised to go to urgent care or ER with any change in redness, induration or fever. 02/01/18-She is here in follow-up evaluation for left great toe ulcers and a new abdominal abscess from last week. She was able to use packing until earlier this week, where she "forgot it was there". She states she was feeling ill with GI symptoms last week and was not taking her antibiotic. She states her glucose levels have been predominantly less than 200, with occasional levels between 200-250. She thinks this was contributing to her GI symptoms as they have resolved without intervention. There continues to be significant laceration to left toe, otherwise it clinically looks stable/improved. There is now less superficial opening to the lateral aspect of the great toe that was residual blister. We will transition to [facility] to all wounds, she will continue her Augmentin. If there is no change or deterioration next week for reculture. 02/08/18-She is here in follow-up evaluation for left great toe ulcer and abdominal ulcer. There is an improvement in both wounds. She has been wrapping her left toe with coban, not by our direction, which has created an area of discoloration to the medial aspect; she has  been advised to NOT use coban secondary to her neuropathy. She states her glucose  levels have been high over this last week ranging from 200-350, she continues to smoke. She admits to being less compliant with her offloading shoe. We will continue with same treatment plan and she will follow-up next week. 02/15/18-She is here in follow-up evaluation for left great toe ulcer and abdominal ulcer. The abdominal ulcer is epithelialized. The left great toe ulcer is improved and all injury from last week using the Coban wrap is resolved, the lateral ulcer is healed. She admits to noncompliance with wearing offloading shoe and admits to glucose levels being greater than 300 most of the week. She continues to smoke and expresses no desire to quit. There is one area medially that probes deeper than it has historically, erythema to the toe and dorsal foot has consistently waxed and waned. There is no overt signs of cellulitis or infection but we will culture the wound for any occult infection given the new area of depth and erythema. We will hold off on sensitivities for initiation of antibiotic therapy. 02/22/18-She is here in follow up evaluation for left great toe ulcer. There is overall significant improvement in both wound appearance, erythema and edema with changes made last week. She was not initiated on antibiotic therapy. Culture obtained last week showed oxacillin sensitive staph aureus, sensitive to clindamycin. Clindamycin has been called into the pharmacy but she has been instructed to hold off on initiation secondary to overall clinical improvement and her history of antibiotic intolerance. She has been instructed to contact the clinic with any noted changes/deterioration and the wound, erythema, edema and/or pain. She will follow-up next week. She continues to smoke and her glucose levels remain elevated >250; she admits to compliance with offloading shoe 03/01/18 on evaluation today patient appears to be doing fairly well in regard to her left first toe ulcer. She has been  tolerating the dressing changes with the [facility] Dressing without complication and overall this has definitely showed signs of improvement according to records as well is what the patient tells me today. I'm very pleased in that regard. She is having no pain today 03/08/18 She is here for follow up evaluation of a left great toe ulcer. She remains non-compliant with glucose control and smoking cessation; glucose levels consistently >200. She states that she got new shoe inserts/peg assist. She admits to compliance with offloading. Since my last evaluation there is significant improvement. We will switch to prisma at this time and she will follow up next week. She is noted to be tachycardic at this appointment, heart rate 120s; she has a history of heart rate 70-130 according to our records. She admits to extreme agitation r/t personal issues; she was advised to monitor her heartrate and contact her physician if it does not return to a more normal range (<100). She takes cardizem twice daily. 03/15/18-She is here in follow-up evaluation for left great toe ulcer. She remains noncompliant with glucose control and smoking cessation. She admits to compliance with wearing offloading shoe. The ulcer is improved/stable and we will continue with the same treatment plan and she will follow-up next week 03/22/18-She is here for evaluation for left great toe ulcer. There continues to be significant improvement despite recurrent hyperglycemia (over 500 yesterday) and she continues to smoke. She has been compliant with offloading and we will continue with same treatment plan and she will follow-up next week. ILEIGH, DOWSE A. ([id number]) 03/29/18-She is here for  evaluation for left great toe ulcer. Despite continuing to smoke and uncontrolled diabetes she continues to improve. She is compliant with offloading shoe. We will continue with the same treatment plan and she will follow-up next week 04/05/18- She is  here in follow up evaluation for a left great toe ulcer; she presents with small pustule to left fifth toe (resembles ant bite). She admits to compliance with wearing offloading shoe; continues to smoke or have uncontrolled blood glucose control. There is more callus than usual with evidence of bleeding; she denies known trauma. 04/12/18-She is here for evaluation of left great toe ulcer. Despite noncompliance with glycemic control and smoking she continues to make improvement. She continues to wear offloading shoe. The pustule, that was identified last week, to the left fifth toe is resolved. She will follow-up in 2 weeks 05/03/18-she is seen in follow-up evaluation for a left great toe ulcer. She is compliant with offloading, otherwise noncompliant with glycemic control and smoking. She has plateaued and there is minimal improvement noted. We will transition to [facility], replaced the insert to her surgical shoe and she will follow-up in one week 05/10/18- She is here in follow up evaluation for a left great toe ulcer. It appears stable despite measurement change. We will continue with same treatment plan and follow up next week. 05/24/18-She is seen in follow-up evaluation for a left great toe ulcer. She remains compliant with offloading, has made significant improvement in her diet, decreasing the amount of sugar/soda. She said her recent A1c was 10.9 which is lower than. She did see a diabetic nutritionist/educator yesterday. She continues to smoke. We will continue with the same treatment plan and she'll follow-up next week. 05/31/18- She is seen in follow-up evaluation for left great toe ulcer. She continues to remain compliant with offloading, continues to make improvement in her diet, increasing her water and decreasing the amount of sugar/soda. She does continue to smoke with no desire to quit. We will apply Prisma to the depth and Hydrofera Blue over. We have not received insurance  authorization for [facility]. She will follow up next week. 06/07/18-She is seen in follow-up evaluation for left great toe ulcer. It has stalled according to today's measurements although base appears stable. She says she saw a diabetic educator yesterday; her average blood sugars are less than 300 which is an improvement for her. She continues to smoke and states "that's my next step" She continues with water over soda. We will order for xray, culture and reinstate ace wrap compression prior to placing apligraf for next week. She is voicing no complaints or concerns. Her dressing will change to iodoflex over the next week in preparation for apligraf. 06/14/18-She is seen in follow-up evaluation for left great toe ulcer. Plain film x-ray performed last week was negative for osteomyelitis. Wound culture obtained last week grew strep B and OSSA; she is initiated on keflex and cefdinir today; there is erythema to the toe which could be from ace wrap compression, she has a history of wrapping too tight and has has been encouraged to maintain ace wraps that we place today. We will hold off on application of apligraf today, will apply next week after antibiotic therapy has been initiated. She admits today that she has resumed taking a shower with her foot/toe submerged in water, she has been reminded to keep foot/toe out of the bath water. She will be seen in follow up next week 06/21/18-she is seen in follow-up evaluation for left great  toe ulcer. She is tolerating antibiotic therapy with no GI disturbance. The wound is stable. Apligraf was applied today. She has been decreasing her smoking, only had 4 cigarettes yesterday and 1 today. She continues being more compliant in diabetic diet. She will follow-up next week for evaluation of site, if stable will remove at 2 weeks. 06/28/18- She is here in follow up evalution. Apligraf was placed last week, she states the dressing fell off on Tuesday and she was dressing  with hydrofera blue. She is healed and will be discharged from the clinic today. She has been instructed to continue with smoking cessation, continue monitoring glucose levels, offloading for an additional 4 weeks and continue with hydrofera blue for additional two weeks for any possible microscopic opening. Readmission: 08/07/18 on evaluation today patient presents for reevaluation concerning the ulcer of her right great toe. She was previously discharged on 06/28/18 healed. Nonetheless she states that this began to show signs of drainage she subsequently went to her primary care provider. Subsequently an x-ray was performed on 08/01/18 which was negative. The patient was also placed on antibiotics at that time. Fortunately they should have been effective for the infection. Nonetheless she's been experiencing some improvement but still has a lot of drainage coming from the wound itself. 08/14/18 on evaluation today patient's wound actually does show signs of improvement in regard to the erythema at this point. She has completed the antibiotics. With that being said we did discuss the possibility of placing her in a total contact cast as of today although I think that I may want to give this just a little bit more time to ensure nothing recurrence as far as her infection is concerned. I do not want to put in the cast and risk infection at that time if things are not completely resolved. With that being said she is gonna require some debridement today. 08/21/18 on evaluation today patient actually appears to be doing okay in regard to her toe ulcer. She's been tolerating the dressing changes without complication. With that being said it does appear that she is ready and in fact I think it's appropriate for [place] to go ahead and initiate the total contact cast today. Nonetheless she will require some sharp debridement to prepare the wound for application. Overall I feel like things have been progressing  well but we do need to do something to get this to close more readily. 08/24/18 patient seen today for reevaluation after having had the total contact cast applied on Tuesday. She seems to have done very well the wound appears to be doing great and overall I'm pleased with the progress that she's made. There were no abnormal areas of rubbing from the cast on her lower extremity. 08/30/18 on evaluation today patient actually appears to be completely healed in regard to her plantar toe ulcer. She tells me at this point she's been having a lot of issues with the cast. She almost fell a couple of times the state shall the step of her dog a couple times as well. This is been a very frustrating process for her other nonetheless she has completely healed the wound which is excellent news. Overall there does not appear to be the evidence of infection at this time which is great news. 09/11/18 evaluation today patient presents for follow-up concerning her great toe ulcer on the left which has unfortunately reopened since I last saw her which was only a couple of weeks ago. Unfortunately she was not able  to get in to get the shoe and potentially the AFO that's gonna be necessary due to her left foot drop. She continues with offloading shoe but this is not enough to prevent her from reopening it appears. When we last had her in the total contact cast she did well from a healing standpoint but unfortunately the wound reopened as soon as she came out of the cast within just a couple of weeks. 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 This is also part of the reason why she falls on a regular basis. I just do not believe that is gonna be safe for [place] to reinitiate the total contact cast as last time we had this on she  fell 3 times one week which is definitely not normal for her. 09/18/18 upon evaluation today the patient actually appears to be doing about the same in regard to her toe ulcer. She did not contact [facility] as I asked her to even though I had given her the prescription. In fact she actually states that she has no idea where the prescription is. She did apparently call [facility] and they told her that all she needed to do was bring the prescription in order to be able to be seen and work on getting the AFO for her left foot. With all that being said she still does not have an appointment and I'm not sure were things stand that regard. I will give her a new prescription today in order to contact them to get this set up. [name], [name] ([id number]) 09/25/18 on evaluation today patient actually appears to be doing about the same in regard to her toes ulcer. She does have a small areas which seems to have a lot of callous buildup around the edge of the wound which is going to need sharp debridement today. She still is waiting to be scheduled for evaluation with [facility] for possibility of an AFO. She states there supposed to call her tomorrow to get this set up. Unfortunately it does appear that her foot specifically the toe area is showing signs of erythema. There does not appear to be any systemic infection which is in these good news. 10/02/18 on evaluation today patient actually appears to be doing about the same in regard to her toe ulcer. This really has not done too well although it's not significantly larger it's also not significantly smaller. She has been tolerating the dressing changes without complication. She actually has her appointment with [facility] and [place] tomorrow to hopefully be measured for obtaining and AFO splint. I think this would be helpful preventing this from reoccurring. We had contemplated starting the cast this week although to be honest I am reluctant to do that as she's been  having nausea, vomiting, and seizure activity over the past three days. She has a history of seizures and have been told is nothing that can be done for these. With that being said I do believe that along with the seizures have the nausea vomiting which upon further questioning doesn't seem to be the normal for her and makes me concerned for the possibility of infection or something else going on. I discussed this with the patient and her mother during the office visit today. I do not feel the  wound is effective but maybe something else. The responses this was "this just happens to her at times and we don't know why". They did not seem to be interested in going to the hospital to have this checked out further. 10/09/18 on evaluation today patient presents for follow-up concerning her ongoing toe ulcer. She has been tolerating the dressing changes without complication. Fortunately there does not appear to be any evidence of infection which is great news however I do think that the patient would benefit from going ahead for with the total contact cast. She's actually in a wheelchair today she tells me that she will use her walker if we initiate the cast. I was very specific about the fact that if we were gonna do the cast I wanted to make sure that she was using the walker in order to prevent any falls. She tells me she does not have stairs that she has to traverse on a regular basis at her home. She has not had any seizures since last week again that something that happens to her often she tells me she did talk to [employer] and they said that it may take up to three weeks to get the brace approved for her. Hopefully that will not take that long but nonetheless in the meantime I do think the cast could be of benefit. 10/12/18 on evaluation today patient appears to be doing rather well in regard to her toe ulcer. It's just been a few days and already this is significantly improved both as far as overall  appearance and size. Fortunately there's no sign of infection. She is here for her first obligatory cast change. 10/19/18 Seen today for follow up and management of left great toe ulcer. Wound continues to show improvement. Noted small open area with seroussang drainage with palpation. Denies any increased pain or recent fevers during visit. She will continue calcium alginate with offloading shoe. Denies any questions or concerns during visit. 10/26/18 on evaluation today patient appears to be doing about the same as when I last saw her in regard to her wound bed. Fortunately there does not appear to be any signs of infection. Unfortunately she continues to have a breakdown in regard to the toe region any time that she is not in the cast. It takes almost no time at all for this to happen. Nonetheless she still has not heard anything from the brace being made by [employer] as to when exactly this will be available to her. Fortunately there is no signs of infection at this time. 10/30/18 on evaluation today patient presents for application of the total contact cast as we just received him this morning. Fortunately we are gonna be able to apply this to her today which is great news. She continues to have no significant pain which is good news. Overall I do feel like things have been improving while she was the cast is when she doesn't have a cast that things get worse. She still has not really heard anything from [place] regarding her brace. 11/02/18 upon evaluation today patient's wound already appears to be doing significantly better which is good news. Fortunately there does not appear to be any signs of infection also good news. Overall I do think the total contact cast as before is helping to heal this area unfortunately it's just not gonna likely keep the area closed and healed without her getting her brace at least. Again the foot drop is a significant issue for her. 11/09/18 on  evaluation today patient  appears to be doing excellent in regard to her toe ulcer which in fact is completely healed. Fortunately we finally got the situation squared away with the paperwork which was needed to proceed with getting her brace approved by Medicaid. I have filled that out unfortunately that information has been sent to the orthopedic office that I worked at 2 1/2 years ago and not tired Current wound care measures. Fortunately she seems to be doing very well at this time. 11/23/18 on evaluation today patient appears to be doing More Poorly Compared to Last Time I Saw Her. At [facility] She Had Completely Healed. Currently she is continuing to have issues with reopening. She states that she just found out that the brace was approved through Medicaid now she just has to go get measured in order to have this fitted for her and then made. Subsequently she does not have an appointment for this yet that is going to complicate things we obviously cannot put her back in the cast if we do not have everything measured because they're not gonna be able to measure her foot while she is in the cast. 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 Obviously this is unfortunate and does have me somewhat worried as well. 11/30/18 on evaluation today patient's toe ulcer actually appears to be doing fairly well. The good news is she will be getting her brace in the shoes next week on Wednesday. Hopefully we will be able to get this to heal without having to go back in the cast however she may need the cast in order to get the wound completely heal and then go from there. Fortunately there's no signs of infection at this time. 12/07/18 on evaluation today patient fortunately did receive her brace and she states she could tell this definitely makes her walk better. With that being said she's been having issues with her toe where she noticed  yesterday there was a lot of tissue that was loosing off this appears to be much larger than what it was previous. She also states that her leg has been read putting much across the top of her foot just about the ankle although this seems to be receiving somewhat. The total area is still red and appears to be someone infected as best I can tell. She is previously taken Bactrim and that may be a good option for her today as well. We are gonna see what I wound culture shows as well and I think that this is definitely appropriate. With that being said outside of the culture I still need to initiate something in the interim and that's what I'm gonna go ahead and select Bactrim is a good option for her. 12/14/18 on evaluation today patient appears to be doing better in regard to her left great toe ulcer as compared to last week's evaluation. There's still some erythema although this is significantly improved which is excellent news. Overall I do believe that she is making good progress is still gonna take some time before she is where I would like her to be from the standpoint of being able to place her back into the total contact cast. Hopefully we will be where we need to be by next week. 12/21/18 on evaluation today patient actually appears to be doing poorly in regard to her toe ulcer. She's been tolerating the  dressing changes without complication. Fortunately there's no signs of systemic infection although she does have a lot of drainage from the toe ulcer and this does [name], [name] ([id number]) seem to be causing some issues at this point. She does have erythema on the distal portion of her toe that appears to be likely cellulitis. 12/28/18 on evaluation today patient actually appears to be doing a little better in my pinion in regard to her toe ulcer. With that being said she still does have some evidence of infection at this time and for her culture she had both E. coli as well as enterococcus as  organisms noted on evaluation. For that reason I think that though the Keflex likely has treated the E. coli rather well this has really done nothing for the enterococcus. We are going to have to initiate treatment for this specifically. 01/04/19 on evaluation today patient's toe actually appears to be doing better from the standpoint of infection. She currently would like to see about putting the cash back on I think that this is appropriate as long as she takes care of it and keeps it from getting wet. She is gonna have some drainage we can definitely pass this up with Drawtex and alginate to try to prevent as much drainage as possible from causing the problems. With that being said I do want to at least try her with the cast between now and Tuesday. If there any issues we can't continue to use it then I will discontinue the use of the cast at that point. 01/08/19 on evaluation today patient actually appears to be doing very well as far as her foot ulcer specifically the great toe on the left is concerned. She did have an area of rubbing on the medial aspect of her left ankle which again is from the cast. Fortunately there's no signs of infection at this point in this appears to be a very slight skin breakdown. The patient tells me she felt it rubbing but didn't think it was that bad. Fortunately there is no signs of active infection at this time which is good news. No fevers, chills, nausea, or vomiting noted at this time. 01/15/19 on evaluation today patient actually appears to be doing well in regard to her toe ulcer. Again as previous she seems to do well and she has the cast on which indicates to me that during the time she doesn't have a cast on she's putting way too much pressure on this region. Obviously I think that's gonna be an issue as with the current national emergency concerning the Covid-19 Virus it has been recommended that we discontinue the use of total contact casting by the chief  medical officer of our company, Dr. Simona Huh. The reasoning is that if a patient becomes sick and cannot come into have the cast removed they could not just leave this on for an additional two weeks. Obviously the hospitals also do not want to receive patient's who are sick into the emergency department to potentially contaminate the region and spread the Covid-19 Virus among other sick individuals within the hospital system. Therefore at this point we are suspending the use of total contact cast until the current emergency subsides. This was all discussed with the patient today as well. 01/22/19 on evaluation today patient's wound on her left great toe appears to be doing slightly worse than previously noted last week. She tells me that she has been on this quite a bit in fact she tells  me she's been awake for 38 straight hours. This is due to the fact that she's having to care for grandparents because nobody else will. She has been taking care of them for five the last seven days since I've seen her they both have dementia his is from a stroke and her grandmother's was progressive. Nonetheless she states even her mom who knows her condition and situation has only help two of those days to take care of them she's been taking care of the rest. Fortunately there does not appear to be any signs of active infection in regard to her toe at this point although obviously it doesn't look as good as it did previous. I think this is directly related to her not taking off the pressure and friction by way of taking things easy. Though I completely understand what's going on. 01/29/19 on evaluation today patient's tools are actually appears to be showing some signs of improvement today compared to last week's evaluation as far as not necessarily the overall size of the wound but the fact that she has some new skin growth in between the two ends of the wound opening. Overall I feel like she has done well she states that  she had a family member give her what sounds to be a CAM walker boot which has been helpful as well. 02/05/19 on evaluation today patient's wound bed actually appears to be doing significantly better in regard to her overall appearance of the size of the wound. With that being said she is still having an issue with offloading efficiently enough to get this to close. Apparently there is some signs of infection at this point as well unfortunately. Previously she's done well of Augmentin I really do not see anything that needs to be culture currently but there theme and cellulitis of the foot that I'm seeing I'm gonna go ahead and place her on an antibiotic today to try to help clear this up. 02/12/2019 on evaluation today patient actually appears to be doing poorly in regard to her overall wound status. She tells me she has been using her offloading shoe but actually comes in today wearing her tennis shoe with the AFO brace. Again as I previously discussed with her this is really not sufficient to allow the area to heal appropriately. Nonetheless she continues to be somewhat noncompliant and I do wonder based on what she has told my nurse in the past as to whether or not a good portion of this noncompliance may be recreational drug and alcohol related. She has had a history of heroin overdose and this was fairly recently in the past couple of months that have been seeing her. Nonetheless overall I feel like her wound looks significantly worse today compared to what it was previous. She still has significant erythema despite the Augmentin I am not sure that this is an appropriate medication for her infection I am also concerned that the infection may have gone down into her bone. 02/19/19 on evaluation today patient actually appears to be doing about the same in regard to her toe ulcer. Unfortunately she continues to show signs of bone exposure and infection at this point. There does not appear to be any  evidence of worsening of the infection but I'm also not really sure that it's getting significantly better. She is on the Augmentin which should be sufficient for the Staphylococcus aureus infection that she has at this point. With that being said she may need IV antibiotics to  more appropriately treat this. We did have a discussion today about hyperbaric option therapy. 02/28/19 on evaluation today patient actually appears to be doing much worse in regard to the wound on her left great toe as compared to even my previous evaluation last week. Unfortunately this seems to be training in a pretty poor direction. Her toe was actually now starting to angle laterally and I can actually see the entire joint area of the proximal portion of the digit where is the distal portion of the digit again is no longer even in contact with the joint line. Unfortunately there's a lot more necrotic tissue around the edge and the toe appears to be showing signs of becoming gangrenous in my pinion. I'm very concerned about were things stand at this point. She did see infectious disease and they are planning to send in a prescription for Sivextro for her and apparently this has been approved. With that being said I don't think she should avoid taking this but at the same time I'm not sure that it's gonna be sufficient to save her toe at this point. 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 03/05/19 on evaluation today patient actually appears to be doing quite well in regard to her toe all things considering. She still has bone exposed but there appears to be much less your thing on overall the appearance of the wound and the toe itself is dramatically improved. She still does have some issues currently obviously with  infection she did see vascular as well and there concerned that her blood flow to the toad. For that reason they are setting up for an angiogram next week. 03/14/19 on evaluation today patient appears to be doing very poor in regard to her toe and specifically in regard to the ulceration and the fact that she's starting to notice the toe was leaning even more towards the lateral aspect and the complete joint is visible on the proximal aspect of the joint. Nonetheless she's also noted a significant odor and the tip of the toe is turning more dark and necrotic appearing. Overall I think she is getting worse not better as far as this is concerned. For that reason I am recommending at this point that she likely needs to be seen for likely amputation. [name], [name] ([id number]) READMISSION 03/19/2021 This is a patient that we cared for in this clinic for a prolonged period of time in 2019 and 2020 with a left foot and left first toe wound. 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 Luana Shu. In December 2021 she had an ulcer on her right great toe as well as the fourth and fifth toes. 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 She also had an angiogram at that time and underwent angioplasty of the right anterior tibial artery. In any case she claims that the wound on the right foot is closed I did not look at this today which was probably an oversight although I think that should be done next week. After her surgery she developed a dehiscence but I do not see any follow-up of this. According to Dr.  Baker's last review that she was out of the area being cared for by another physician but recently came back to his attention. 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 A culture of this area showed E. coli apparently before she came back to see  Dr. Luana Shu she was supposed to be receiving antibiotics but she did not really take them. Nor is she offloading this area at all. Finally her last hemoglobin A1c listed in epic was in March 2022 at 14.1 she says things are a lot better since then although I am not sure. 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 She had this wound at the time. During this admission she had E. coli sepsis a TEE was negative. 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 She is still a heavy cigarette smoker. She has a history of bipolar disorder seizure disorder ABI in our clinic was 1.05 6/1; left midfoot in the setting of a TMA done previously. Round circular wound with a "knuckle" of protruding tissue. The problem is that the knuckle was not attached to any of the surrounding granulation and this probed proximally widely I removed a large portion of this tissue. This wound goes with considerable undermining laterally. I do not feel any bone there was no purulence but this is a deep wound. 6/8; in spite of the debridement I did last week. She arrives with a wound looking exactly the same. A protruding "knuckle" of tissue nonadherent to most of the surrounding tissue. There is considerable depth around this from 6-12 o'clock at 2.7 cm and undermining of 1 cm. This does not look overtly infected and the x-ray I did last week was negative for any osseous abnormalities. We have been using silver collagen 6/15; deep tissue culture I did last week showed moderate staph aureus and moderate Pseudomonas. This will definitely require prolonged antibiotic therapy. The pathology on the  protuberant area was negative for malignancy fungus etc. 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 We have been using silver collagen. I am going to be prescribing Levaquin for 2 weeks. Her CT scan of the foot is down for 7/5 6/22; CT scan of the foot on 7 5. She says she has hardware in the left leg from her previous fracture. She is on the Levaquin for the deep tissue culture I did that showed methicillin sensitive staph aureus and Pseudomonas. I gave her a 2-week supply and she will have another week. She arrives in clinic today with the same protuberant tissue however this is nonadherent to the tissue surrounding it. I am really at a loss to explain this unless there is underlying deep tissue infection 6/29; patient presents for 1 week follow-up. She has been using collagen to the wound bed. She reports taking her antibiotics as prescribed.She has no complaints or issues today. She denies signs of infection. 7/6; patient presents for one week followup. She has been using collagen to the wound bed. She states she is taking Levaquin however at times she is not able to keep it down. She denies signs of infection. 7/13; patient presents for 1 week follow-up. She has been using silver alginate to the wound bed.  She still has nausea when taking her antibiotics. She denies signs of infection. 7/20; patient presents for 1 week follow-up. She has been using silver alginate with gentamicin cream to the wound bed. She denies any issues and has no complaints today. She denies signs of infection. 7/27; patient presents for 1 week follow-up. She continues to use silver alginate with gentamicin cream to the wound bed. She reports starting her antibiotics. She has no issues or complaints. Overall she reports stability to the wound. Patient History Information obtained from Patient. Social History Current every day smoker, Marital Status - Single,  Alcohol Use - Never, Drug Use - Prior History, Caffeine Use - Daily. Medical History Ear/Nose/Mouth/Throat Patient has history of Chronic sinus problems/congestion, Middle ear problems Hematologic/Lymphatic Patient has history of Anemia Respiratory Patient has history of Chronic Obstructive Pulmonary Disease (COPD) Cardiovascular Patient has history of Congestive Heart Failure - EF 33% Endocrine Patient has history of Type II Diabetes Genitourinary Patient has history of End Stage Renal Disease Integumentary (Skin) Patient has history of History of pressure wounds Neurologic Patient has history of Neuropathy - feel and legs Curb, Marria A. ([id number]) Objective Constitutional Vitals Time Taken: 1:01 PM, Height: 69 in, Weight: 185 lbs, BMI: 27.3, Temperature: 98.6 F, Pulse: 78 bpm, Respiratory Rate: 18 breaths/min, Blood Pressure: 92/59 mmHg. General Notes: Left foot: Wound to the plantar aspect that that has granulation tissue and nonviable tissue present. There is significant callus circumferentially. Integumentary (Hair, Skin) Wound #7 status is Open. Original cause of wound was Gradually Appeared. The date acquired was: 08/07/2020. The wound has been in treatment 8 weeks. The wound is located on the [place]. The wound measures 1.7cm length x 1.9cm width x 0.7cm depth; 2.537cm^2 area and 1.776cm^3 volume. There is Fat Layer (Subcutaneous Tissue) exposed. There is no tunneling noted, however, there is undermining starting at 3:00 and ending at 7:00 with a maximum distance of 0.4cm. There is a medium amount of serosanguineous drainage noted. The wound margin is thickened. There is large (67-100%) pink granulation within the wound bed. There is a small (1-33%) amount of necrotic tissue within the wound bed including Adherent Slough. Assessment Active Problems ICD-10 Type 2 diabetes mellitus with foot ulcer Non-pressure chronic ulcer of other part of left foot  with other specified severity Type 2 diabetes mellitus with diabetic polyneuropathy Patient's wound bed appears slightly improved from last clinic visit. There are no obvious signs of infection. I debrided nonviable tissue. I recommended continue with silver alginate and gentamicin ointment as this has done well. She reports starting her oral antibiotics and tolerating this well. It turns out that she is approved 100% for Apligraf and I would like to start this after she finishes her course of antibiotics. I will reassess at next clinic visit if we will order this. Procedures Wound #7 Pre-procedure diagnosis of Wound #7 is a Pressure Ulcer located on the Left,Midline,Plantar Foot . There was a Excisional Skin/Subcutaneous Tissue Debridement with a total area of 3.23 sq cm performed by Kalman Shan, MD. With the following instrument(s): Curette to remove Viable tissue/material. Material removed includes Subcutaneous Tissue. A time out was conducted at 13:19, prior to the start of the procedure. A Minimum amount of bleeding was controlled with Pressure. The procedure was tolerated well. Post Debridement Measurements: 1.7cm length x 1.9cm width x 0.7cm depth; 1.776cm^3 volume. Post debridement Stage noted as Category/Stage III. Character of Wound/Ulcer Post Debridement is stable. Post procedure Diagnosis Wound #7: Same as Pre-Procedure Plan Follow-up Appointments:  Return Appointment in 1 week. Bathing/ Shower/ Hygiene: Clean wound with Normal Saline or wound cleanser. Off-Loading: Open toe surgical shoe with peg assist. Turn and reposition every 2 hours Medications-Please add to medication list.: Selvey, Donte A. ([id number]) P.O. Antibiotics - continue cipro WOUND #7: - Foot Wound Laterality: Plantar, Left, Midline Cleanser: Soap and Water 3 x Per Week/30 Days Discharge Instructions: Gently cleanse wound with antibacterial soap, rinse and pat dry prior to dressing wounds Peri-Wound  Care: Desitin Maximum Strength Ointment 4 (oz) 3 x Per Week/30 Days Discharge Instructions: peri wound Topical: Gentamicin 3 x Per Week/30 Days Discharge Instructions: Apply to wound bed and tunnel area under the dressing Primary Dressing: Silvercel Small 2x2 (in/in) 3 x Per Week/30 Days Discharge Instructions: Apply to wound bed Secondary Dressing: ABD Pad 5x9 (in/in) 3 x Per Week/30 Days Discharge Instructions: Cover with ABD pad on top of dressing Secondary Dressing: Gauze 3 x Per Week/30 Days Discharge Instructions: To hold dressing in place Secured With: Kerlix Roll Sterile or Non-Sterile 6-ply 4.5x4 (yd/yd) 3 x Per Week/30 Days Discharge Instructions: Apply Kerlix as directed 1. In office sharp debridement 2. Gentamicin ointment with silver alginate 3. Follow-up in 1 week 4. Continue oral antibiotics Electronic Signature(s) Signed: 05/19/2021 1:38:21 PM By: Kalman Shan DO Entered By: Kalman Shan on 05/19/2021 13:37:54 [name] ([id number]) -------------------------------------------------------------------------------- ROS/PFSH Details Patient Name: Heather Dear A. Date of Service: 05/19/2021 1:00 PM Medical Record Number: [id number] Patient Account Number: [id number] Date of Birth/Sex: September 19, 1976 (45 y.o. F) Treating RN: Cornell Barman Primary Care Provider: Raelene Bott Other Clinician: Referring Provider: Raelene Bott Treating Provider/Extender: Yaakov Guthrie in Treatment: 8 Information Obtained From Patient Ear/Nose/Mouth/Throat Medical History: Positive for: Chronic sinus problems/congestion; Middle ear problems Hematologic/Lymphatic Medical History: Positive for: Anemia Respiratory Medical History: Positive for: Chronic Obstructive Pulmonary Disease (COPD) Cardiovascular Medical History: Positive for: Congestive Heart Failure - EF 33% Endocrine Medical History: Positive for: Type II Diabetes Time with diabetes: 14 years Treated  with: Insulin, Oral agents Blood sugar tested every day: No Blood sugar testing results: Bedtime: 176 Genitourinary Medical History: Positive for: End Stage Renal Disease Integumentary (Skin) Medical History: Positive for: History of pressure wounds Neurologic Medical History: Positive for: Neuropathy - feel and legs HBO Extended History Items Ear/Nose/Mouth/Throat: Ear/Nose/Mouth/Throat: Chronic sinus problems/congestion Middle ear problems Immunizations Pneumococcal Vaccine: Received Pneumococcal Vaccination: No [name], Zoiee A. ([id number]) Implantable Devices No devices added Family and Social History Current every day smoker; Marital Status - Single; Alcohol Use: Never; Drug Use: Prior History; Caffeine Use: Daily Electronic Signature(s) Signed: 05/19/2021 1:38:21 PM By: Kalman Shan DO Signed: 05/19/2021 5:51:34 PM By: Gretta Cool, BSN, RN, CWS, [name] RN, BSN Entered By: Kalman Shan on 05/19/2021 13:34:31 [name] ([id number]) -------------------------------------------------------------------------------- SuperBill Details Patient Name: Heather Dear A. Date of Service: 05/19/2021 Medical Record Number: [id number] Patient Account Number: [id number] Date of Birth/Sex: February 13, 1976 (45 y.o. F) Treating RN: Dolan Amen Primary Care Provider: Raelene Bott Other Clinician: Referring Provider: Raelene Bott Treating Provider/Extender: Yaakov Guthrie in Treatment: 8 Diagnosis Coding ICD-10 Codes Code Description E11.621 Type 2 diabetes mellitus with foot ulcer 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ity E11.42 Type 2 diabetes mellitus with diabetic polyneuropathy Facility Procedures CPT4 Code: [id number] Description: [id number] - DEB SUBQ TISSUE 20 SQ CM/< Modifier: Quantity: 1 CPT4 Code: Description: ICD-10 Diagnosis Description L97.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 of other part of left foot with other  specified Modifier: severity Quantity: Physician Procedures CPT4 [id number] Description: [id number] -  WC PHYS SUBQ TISS 20 SQ CM Modifier: Quantity: 1 CPT4 Code: Description: ICD-10 Diagnosis Description 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 Modifier: severity Quantity: Electronic Signature(s) Signed: 05/19/2021 1:38:21 PM By: Kalman Shan DO Entered By: Kalman Shan on 05/19/2021 13:38:00

## 2021-05-26 ENCOUNTER — Encounter: Payer: Medicaid Other | Attending: Internal Medicine | Admitting: Internal Medicine

## 2021-05-26 ENCOUNTER — Other Ambulatory Visit: Payer: Self-pay

## 2021-05-26 DIAGNOSIS — M21372 Foot drop, left foot: Secondary | ICD-10-CM | POA: Insufficient documentation

## 2021-05-26 DIAGNOSIS — N186 End stage renal disease: Secondary | ICD-10-CM | POA: Diagnosis not present

## 2021-05-26 DIAGNOSIS — E11621 Type 2 diabetes mellitus with foot ulcer: Secondary | ICD-10-CM | POA: Diagnosis not present

## 2021-05-26 DIAGNOSIS — G40909 Epilepsy, unspecified, not intractable, without status epilepticus: Secondary | ICD-10-CM | POA: Insufficient documentation

## 2021-05-26 DIAGNOSIS — E1122 Type 2 diabetes mellitus with diabetic chronic kidney disease: Secondary | ICD-10-CM | POA: Diagnosis not present

## 2021-05-26 DIAGNOSIS — I509 Heart failure, unspecified: Secondary | ICD-10-CM | POA: Insufficient documentation

## 2021-05-26 DIAGNOSIS — I132 Hypertensive heart and chronic kidney disease with heart failure and with stage 5 chronic kidney disease, or end stage renal disease: Secondary | ICD-10-CM | POA: Diagnosis not present

## 2021-05-26 DIAGNOSIS — F319 Bipolar disorder, unspecified: Secondary | ICD-10-CM | POA: Diagnosis not present

## 2021-05-26 DIAGNOSIS — Z79899 Other long term (current) drug therapy: Secondary | ICD-10-CM | POA: Diagnosis not present

## 2021-05-26 DIAGNOSIS — L97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ity: Secondary | ICD-10-CM

## 2021-05-26 NOTE — Progress Notes (Signed)
ALHELI, ALVA ([id number]) Visit Report for 05/26/2021 Chief Complaint Document Details Patient Name: [name], [name] Date of Service: 05/26/2021 2:00 PM Medical Record Number: [id number] Patient Account Number: [id number] Date of Birth/Sex: 1975-11-03 (45 y.o. F) Treating RN: Dolan Amen Primary Care Provider: Raelene Bott Other Clinician: Referring Provider: Raelene Bott Treating Provider/Extender: Yaakov Guthrie in Treatment: 9 Information Obtained from: Patient Chief Complaint Left great toe ulcer 03/19/2021; patient referred by Dr. Luana Shu who has been looking after [name] left foot for quite [name] period of time for review of [name] nonhealing area in the left midfoot Electronic Signature(s) Signed: 05/26/2021 3:31:08 PM By: Kalman Shan DO Entered By: Kalman Shan on 05/26/2021 15:07:27 [name] ([id number]) -------------------------------------------------------------------------------- Debridement Details Patient Name: [name] Date of Service: 05/26/2021 2:00 PM Medical Record Number: [id number] Patient Account Number: [id number] Date of Birth/Sex: [birth year] (45 y.o. F) Treating RN: Dolan Amen Primary Care Provider: Raelene Bott Other Clinician: Referring Provider: Raelene Bott Treating Provider/Extender: Yaakov Guthrie in Treatment: 9 Debridement Performed for Wound #7 Left,Midline,Plantar Foot Assessment: Performed By: Physician Kalman Shan, MD Debridement Type: Debridement Level of Consciousness (Pre- Awake and Alert procedure): Pre-procedure Verification/Time Out Yes - 15:00 Taken: Start Time: 15:00 Total Area Debrided (L x W): 2 (cm) x 2 (cm) = 4 (cm) Tissue and other material Viable, Non-Viable, Callus, Subcutaneous debrided: Level: Skin/Subcutaneous Tissue Debridement Description: Excisional Instrument: Curette Bleeding: Minimum Hemostasis Achieved: Pressure Response to Treatment: Procedure was tolerated  well Level of Consciousness (Post- Awake and Alert procedure): Post Debridement Measurements of Total Wound Length: (cm) 2 Stage: Category/Stage II Width: (cm) 3.3 Depth: (cm) 1.1 Volume: (cm) 5.702 Character of Wound/Ulcer Post Debridement: Stable Post Procedure Diagnosis Same as Pre-procedure Electronic Signature(s) Signed: 05/26/2021 3:31:08 PM By: Kalman Shan DO Signed: 05/26/2021 4:40:35 PM By: Dolan Amen RN Entered By: Dolan Amen on 05/26/2021 15:04:09 [place], [place] ([id number]) -------------------------------------------------------------------------------- HPI Details Patient Name: [name] Date of Service: 05/26/2021 2:00 PM Medical Record Number: [id number] Patient Account Number: [id number] Date of Birth/Sex: [birth year] (45 y.o. F) Treating RN: Dolan Amen Primary Care Provider: Raelene Bott Other Clinician: Referring Provider: Raelene Bott Treating Provider/Extender: Yaakov Guthrie in Treatment: 9 History of Present Illness HPI Description: 01/18/18-She is here for initial evaluation of the left great toe ulcer. She is [name] poor historian in regards to timeframe in detail. She states approximately 4 weeks ago she lacerated [name] toe on something in the house. She followed up with [name] primary care who placed [name] on Bactrim and ultimately [name] second dose of Bactrim prior to coming to wound clinic. She states she has been treating the toe with peroxide, Betadine and [name] Band-Aid. She did not check [name] blood sugar this morning but checked it yesterday morning it was 327; she is unaware of [name] recent A1c and there are no current records. She saw Dr. she would've orthopedics last week for an old injury to the left ankle, she states he did not see [name] toe, nor did she bring it to his attention. She smokes approximately 1 pack cigarettes [name] day. [name] social situation is concerning, she arrives this morning with [name] mother who appears extremely  intoxicated/under the influence; [name] mother was asked to leave the room and be monitored by the patient's grandmother. The patient's aunt then accompanied the patient and the room throughout the rest of the appointment. We had [name] lengthy discussion regarding the deleterious effects of uncontrolled hyperglycemia and smoking as it relates to wound healing  and overall health. She was strongly encouraged to decrease [name] smoking and get [name] diabetes under better control. She states she is currently on [name] diet and has cut down [name] [facility] consumption. The left toe is erythematous, macerated and slightly edematous with malodor present. The edema in [name] left foot is below [name] baseline, there is no erythema streaking. We will treat [name] with Santyl, doxycycline; we have ordered and xray, culture and provided [name] Peg assist surgical shoe and cultured the wound. 01/25/18-She is here in follow-up evaluation for [name] left great toe ulcer and presents with an abscess to [name] suprapubic area. She states [name] blood sugars remain elevated, feeling "sick" and if levels are below 250, but she is trying. She has made no attempt to decrease [name] smoking stating that we "can't take away [name] food in [name] cigarettes". She has been compliant with offloading using the PEG assist you. She is using Santyl daily. the culture obtained last week grew staph aureus and Enterococcus faecalis; continues on the doxycycline and Augmentin was added on Monday. The suprapubic area has erythema, no femoral variation, purple discoloration, minimal induration, was accessed with [name] cotton tip applicator with sanguinopurulent drainage, this was cultured, I suspect the current antibiotic treatment will cover and we will not add anything to [name] current treatment plan. She was advised to go to urgent care or ER with any change in redness, induration or fever. 02/01/18-She is here in follow-up evaluation for left great toe ulcers and [name] new abdominal abscess  from last week. She was able to use packing until earlier this week, where she "forgot it was there". She states she was feeling ill with GI symptoms last week and was not taking [name] antibiotic. She states [name] glucose levels have been predominantly less than 200, with occasional levels between 200-250. She thinks this was contributing to [name] GI symptoms as they have resolved without intervention. There continues to be significant laceration to left toe, otherwise it clinically looks stable/improved. There is now less superficial opening to the lateral aspect of the great toe that was residual blister. We will transition to [facility] to all wounds, she will continue [name] Augmentin. If there is no change or deterioration next week for reculture. 02/08/18-She is here in follow-up evaluation for left great toe ulcer and abdominal ulcer. There is an improvement in both wounds. She has been wrapping [name] left toe with coban, not by our direction, which has created an area of discoloration to the medial aspect; she has been advised to NOT use coban secondary to [name] neuropathy. She states [name] glucose levels have been high over this last week ranging from 200-350, she continues to smoke. She admits to being less compliant with [name] offloading shoe. We will continue with same treatment plan and she will follow-up next week. 02/15/18-She is here in follow-up evaluation for left great toe ulcer and abdominal ulcer. The abdominal ulcer is epithelialized. The left great toe ulcer is improved and all injury from last week using the Coban wrap is resolved, the lateral ulcer is healed. She admits to noncompliance with wearing offloading shoe and admits to glucose levels being greater than 300 most of the week. She continues to smoke and expresses no desire to quit. There is one area medially that probes deeper than it has historically, erythema to the toe and dorsal foot has consistently waxed and waned. There is no  overt signs of cellulitis or infection but we will culture the wound for any  occult infection given the new area of depth and erythema. We will hold off on sensitivities for initiation of antibiotic therapy. 02/22/18-She is here in follow up evaluation for left great toe ulcer. There is overall significant improvement in both wound appearance, erythema and edema with changes made last week. She was not initiated on antibiotic therapy. Culture obtained last week showed oxacillin sensitive staph aureus, sensitive to clindamycin. Clindamycin has been called into the pharmacy but she has been instructed to hold off on initiation secondary to overall clinical improvement and [name] history of antibiotic intolerance. She has been instructed to contact the clinic with any noted changes/deterioration and the wound, erythema, edema and/or pain. She will follow-up next week. She continues to smoke and [name] glucose levels remain elevated >250; she admits to compliance with offloading shoe 03/01/18 on evaluation today patient appears to be doing fairly well in regard to [name] left first toe ulcer. She has been tolerating the dressing changes with the [facility] Dressing without complication and overall this has definitely showed signs of improvement according to records as well is what the patient tells me today. I'm very pleased in that regard. She is having no pain today 03/08/18 She is here for follow up evaluation of [name] left great toe ulcer. She remains non-compliant with glucose control and smoking cessation; glucose levels consistently >200. She states that she got new shoe inserts/peg assist. She admits to compliance with offloading. Since my last evaluation there is significant improvement. We will switch to prisma at this time and she will follow up next week. She is noted to be tachycardic at this appointment, heart rate 120s; she has [name] history of heart rate 70-130 according to our records. She admits to extreme  agitation r/t personal issues; she was advised to monitor [name] heartrate and contact [name] physician if it does not return to [name] more normal range (<100). She takes cardizem twice daily. 03/15/18-She is here in follow-up evaluation for left great toe ulcer. She remains noncompliant with glucose control and smoking cessation. She admits to compliance with wearing offloading shoe. The ulcer is improved/stable and we will continue with the same treatment plan and she will follow-up next week 03/22/18-She is here for evaluation for left great toe ulcer. There continues to be significant improvement despite recurrent hyperglycemia (over 500 yesterday) and she continues to smoke. She has been compliant with offloading and we will continue with same treatment plan and she will follow-up next week. 03/29/18-She is here for evaluation for left great toe ulcer. Despite continuing to smoke and uncontrolled diabetes she continues to improve. She is compliant with offloading shoe. We will continue with the same treatment plan and she will follow-up next week 04/05/18- She is here in follow up evaluation for [name] left great toe ulcer; she presents with small pustule to left fifth toe (resembles ant bite). She admits to compliance with wearing offloading shoe; continues to smoke or have uncontrolled blood glucose control. There is more callus than usual with evidence of bleeding; she denies known trauma. 04/12/18-She is here for evaluation of left great toe ulcer. Despite noncompliance with glycemic control and smoking she continues to make Lohr, Payal [name]. ([id number]) improvement. She continues to wear offloading shoe. The pustule, that was identified last week, to the left fifth toe is resolved. She will follow-up in 2 weeks 05/03/18-she is seen in follow-up evaluation for [name] left great toe ulcer. She is compliant with offloading, otherwise noncompliant with glycemic control and smoking. She has  plateaued and there is  minimal improvement noted. We will transition to [facility] replaced the insert to [name] surgical shoe and she will follow-up in one week 05/10/18- She is here in follow up evaluation for [name] left great toe ulcer. It appears stable despite measurement change. We will continue with same treatment plan and follow up next week. 05/24/18-She is seen in follow-up evaluation for [name] left great toe ulcer. She remains compliant with offloading, has made significant improvement in [name] diet, decreasing the amount of sugar/soda. She said [name] recent A1c was 10.9 which is lower than. She did see [name] diabetic nutritionist/educator yesterday. She continues to smoke. We will continue with the same treatment plan and she'll follow-up next week. 05/31/18- She is seen in follow-up evaluation for left great toe ulcer. She continues to remain compliant with offloading, continues to make improvement in [name] diet, increasing [name] water and decreasing the amount of sugar/soda. She does continue to smoke with no desire to quit. We will apply Prisma to the depth and Hydrofera Blue over. We have not received insurance authorization for oasis. She will follow up next week. 06/07/18-She is seen in follow-up evaluation for left great toe ulcer. It has stalled according to today's measurements although base appears stable. She says she saw [name] diabetic educator yesterday; [name] average blood sugars are less than 300 which is an improvement for [name] She continues to smoke and states "that's my next step" She continues with water over soda. We will order for xray, culture and reinstate ace wrap compression prior to placing apligraf for next week. She is voicing no complaints or concerns. [name] dressing will change to iodoflex over the next week in preparation for apligraf. 06/14/18-She is seen in follow-up evaluation for left great toe ulcer. Plain film x-ray performed last week was negative for osteomyelitis. Wound culture obtained last week grew  strep B and OSSA; she is initiated on keflex and cefdinir today; there is erythema to the toe which could be from ace wrap compression, she has [name] history of wrapping too tight and has has been encouraged to maintain ace wraps that we place today. We will hold off on application of apligraf today, will apply next week after antibiotic therapy has been initiated. She admits today that she has resumed taking [name] shower with [name] foot/toe submerged in water, she has been reminded to keep foot/toe out of the bath water. She will be seen in follow up next week 06/21/18-she is seen in follow-up evaluation for left great toe ulcer. She is tolerating antibiotic therapy with no GI disturbance. The wound is stable. Apligraf was applied today. She has been decreasing [name] smoking, only had 4 cigarettes yesterday and 1 today. She continues being more compliant in diabetic diet. She will follow-up next week for evaluation of site, if stable will remove at 2 weeks. 06/28/18- She is here in follow up evalution. Apligraf was placed last week, she states the dressing fell off on Tuesday and she was dressing with hydrofera blue. She is healed and will be discharged from the clinic today. She has been instructed to continue with smoking cessation, continue monitoring glucose levels, offloading for an additional 4 weeks and continue with hydrofera blue for additional two weeks for any possible microscopic opening. Readmission: 08/07/18 on evaluation today patient presents for reevaluation concerning the ulcer of [name] right great toe. She was previously discharged on 06/28/18 healed. Nonetheless she states that this began to show signs of drainage she subsequently went to  [name] primary care provider. Subsequently an x-ray was performed on 08/01/18 which was negative. The patient was also placed on antibiotics at that time. Fortunately they should have been effective for the infection. Nonetheless she's been experiencing some  improvement but still has [name] lot of drainage coming from the wound itself. 08/14/18 on evaluation today patient's wound actually does show signs of improvement in regard to the erythema at this point. She has completed the antibiotics. With that being said we did discuss the possibility of placing [name] in [name] total contact cast as of today although I think that I may want to give this just [name] little bit more time to ensure nothing recurrence as far as [name] infection is concerned. I do not want to put in the cast and risk infection at that time if things are not completely resolved. With that being said she is gonna require some debridement today. 08/21/18 on evaluation today patient actually appears to be doing okay in regard to [name] toe ulcer. She's been tolerating the dressing changes without complication. With that being said it does appear that she is ready and in fact I think it's appropriate for [place] to go ahead and initiate the total contact cast today. Nonetheless she will require some sharp debridement to prepare the wound for application. Overall I feel like things have been progressing well but we do need to do something to get this to close more readily. 08/24/18 patient seen today for reevaluation after having had the total contact cast applied on Tuesday. She seems to have done very well the wound appears to be doing great and overall I'm pleased with the progress that she's made. There were no abnormal areas of rubbing from the cast on [name] lower extremity. 08/30/18 on evaluation today patient actually appears to be completely healed in regard to [name] plantar toe ulcer. She tells me at this point she's been having [name] lot of issues with the cast. She almost fell [name] couple of times the state shall the step of [name] dog [name] couple times as well. This is been [name] very frustrating process for [name] other nonetheless she has completely healed the wound which is excellent news. Overall there does not appear to be  the evidence of infection at this time which is great news. 09/11/18 evaluation today patient presents for follow-up concerning [name] great toe ulcer on the left which has unfortunately reopened since I last saw [name] which was only [name] couple of weeks ago. Unfortunately she was not able to get in to get the shoe and potentially the AFO that's gonna be necessary due to [name] left foot drop. She continues with offloading shoe but this is not enough to prevent [name] from reopening it appears. When we last had [name] in the total contact cast she did well from [name] healing standpoint but unfortunately the wound reopened as soon as she came out of the cast within just [name] couple of weeks. 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 This is also part of the reason why she falls on [name] regular basis. I just do not believe that is gonna be safe for [place] to reinitiate the total contact cast as last time we had this on she fell 3 times one week which is  definitely not normal for [name] 09/18/18 upon evaluation today the patient actually appears to be doing about the same in regard to [name] toe ulcer. She did not contact [employer] as I asked [name] to even though I had given [name] the prescription. In fact she actually states that she has no idea where the prescription is. She did apparently call [employer] and they told [name] that all she needed to do was bring the prescription in order to be able to be seen and work on getting the AFO for [name] left foot. With all that being said she still does not have an appointment and I'm not sure were things stand that regard. I will give [name] [name] new prescription today in order to contact them to get this set up. 09/25/18 on evaluation today patient actually appears to be doing about the same in regard to [name] toes  ulcer. She does have [name] small areas which seems to have [name] lot of callous buildup around the edge of the wound which is going to need sharp debridement today. She still is waiting to be scheduled for evaluation with [employer] for possibility of an AFO. She states there supposed to call [name] tomorrow to get this set up. Unfortunately it does appear that [name] foot specifically the toe area is showing signs of erythema. There does not appear to be any systemic infection which is in these good news. STAMATIA, WISSE [name]. ([id number]) 10/02/18 on evaluation today patient actually appears to be doing about the same in regard to [name] toe ulcer. This really has not done too well although it's not significantly larger it's also not significantly smaller. She has been tolerating the dressing changes without complication. She actually has [name] appointment with [employer] and [facility] tomorrow to hopefully be measured for obtaining and AFO splint. I think this would be helpful preventing this from reoccurring. We had contemplated starting the cast this week although to be honest I am reluctant to do that as she's been having nausea, vomiting, and seizure activity over the past three days. She has [name] history of seizures and have been told is nothing that can be done for these. With that being said I do believe that along with the seizures have the nausea vomiting which upon further questioning doesn't seem to be the normal for [name] and makes me concerned for the possibility of infection or something else going on. I discussed this with the patient and [name] mother during the office visit today. I do not feel the wound is effective but maybe something else. The responses this was "this just happens to [name] at times and we don't know why". They did not seem to be interested in going to the hospital to have this checked out further. 10/09/18 on evaluation today patient presents for follow-up concerning [name] ongoing toe ulcer. She has  been tolerating the dressing changes without complication. Fortunately there does not appear to be any evidence of infection which is great news however I do think that the patient would benefit from going ahead for with the total contact cast. She's actually in [name] wheelchair today she tells me that she will use [name] walker if we initiate the cast. I was very specific about the fact that if we were gonna do the cast I wanted to make sure that she was using the walker in order to prevent any falls. She tells me she does not have stairs that she has to traverse on [name] regular basis at [name] home.  She has not had any seizures since last week again that something that happens to [name] often she tells me she did talk to [employer] and they said that it may take up to three weeks to get the brace approved for [name] Hopefully that will not take that long but nonetheless in the meantime I do think the cast could be of benefit. 10/12/18 on evaluation today patient appears to be doing rather well in regard to [name] toe ulcer. It's just been [name] few days and already this is significantly improved both as far as overall appearance and size. Fortunately there's no sign of infection. She is here for [name] first obligatory cast change. 10/19/18 Seen today for follow up and management of left great toe ulcer. Wound continues to show improvement. Noted small open area with seroussang drainage with palpation. Denies any increased pain or recent fevers during visit. She will continue calcium alginate with offloading shoe. Denies any questions or concerns during visit. 10/26/18 on evaluation today patient appears to be doing about the same as when I last saw [name] in regard to [name] wound bed. Fortunately there does not appear to be any signs of infection. Unfortunately she continues to have [name] breakdown in regard to the toe region any time that she is not in the cast. It takes almost no time at all for this to happen. Nonetheless she still has  not heard anything from the brace being made by [employer] as to when exactly this will be available to [name] Fortunately there is no signs of infection at this time. 10/30/18 on evaluation today patient presents for application of the total contact cast as we just received him this morning. Fortunately we are gonna be able to apply this to [name] today which is great news. She continues to have no significant pain which is good news. Overall I do feel like things have been improving while she was the cast is when she doesn't have [name] cast that things get worse. She still has not really heard anything from [facility] regarding [name] brace. 11/02/18 upon evaluation today patient's wound already appears to be doing significantly better which is good news. Fortunately there does not appear to be any signs of infection also good news. Overall I do think the total contact cast as before is helping to heal this area unfortunately it's just not gonna likely keep the area closed and healed without [name] getting [name] brace at least. Again the foot drop is [name] significant issue for [name] 11/09/18 on evaluation today patient appears to be doing excellent in regard to [name] toe ulcer which in fact is completely healed. Fortunately we finally got the situation squared away with the paperwork which was needed to proceed with getting [name] brace approved by Medicaid. I have filled that out unfortunately that information has been sent to the orthopedic office that I worked at 2 1/2 years ago and not tired Current wound care measures. Fortunately she seems to be doing very well at this time. 11/23/18 on evaluation today patient appears to be doing More Poorly Compared to Last Time I Saw [name] At [facility] She Had Completely Healed. Currently she is continuing to have issues with reopening. She states that she just found out that the brace was approved through Medicaid now she just has to go get measured in order to have this fitted for [name] and  then made. Subsequently she does not have an appointment for this yet that is going to complicate things  we obviously cannot put [name] back in the cast if we do not have everything measured because they're not gonna be able to measure [name] foot while she is in the cast. 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 Obviously this is unfortunate and does have me somewhat worried as well. 11/30/18 on evaluation today patient's toe ulcer actually appears to be doing fairly well. The good news is she will be getting [name] brace in the shoes next week on Wednesday. Hopefully we will be able to get this to heal without having to go back in the cast however she may need the cast in order to get the wound completely heal and then go from there. Fortunately there's no signs of infection at this time. 12/07/18 on evaluation today patient fortunately did receive [name] brace and she states she could tell this definitely makes [name] walk better. With that being said she's been having issues with [name] toe where she noticed yesterday there was [name] lot of tissue that was loosing off this appears to be much larger than what it was previous. She also states that [name] leg has been read putting much across the top of [name] foot just about the ankle although this seems to be receiving somewhat. The total area is still red and appears to be someone infected as best I can tell. She is previously taken Bactrim and that may be [name] good option for [name] today as well. We are gonna see what I wound culture shows as well and I think that this is definitely appropriate. With that being said outside of the culture I still need to initiate something in the interim and that's what I'm gonna go ahead and select Bactrim is [name] good option for [name] 12/14/18 on evaluation today patient appears to be doing better in regard to [name] left great toe ulcer as compared to  last week's evaluation. There's still some erythema although this is significantly improved which is excellent news. Overall I do believe that she is making good progress is still gonna take some time before she is where I would like [name] to be from the standpoint of being able to place [name] back into the total contact cast. Hopefully we will be where we need to be by next week. 12/21/18 on evaluation today patient actually appears to be doing poorly in regard to [name] toe ulcer. She's been tolerating the dressing changes without complication. Fortunately there's no signs of systemic infection although she does have [name] lot of drainage from the toe ulcer and this does seem to be causing some issues at this point. She does have erythema on the distal portion of [name] toe that appears to be likely cellulitis. 12/28/18 on evaluation today patient actually appears to be doing [name] little better in my pinion in regard to [name] toe ulcer. With that being said she still does have some evidence of infection at this time and for [name] culture she had both E. coli as well as enterococcus as organisms noted on evaluation. For that reason I think that though the Keflex likely has treated the E. coli rather well this has really done nothing for the enterococcus. We are going to have to initiate treatment for this specifically. [name], [name] ([id number]) 01/04/19 on evaluation today patient's toe actually appears to be doing better from the standpoint of infection. She currently would like  to see about putting the cash back on I think that this is appropriate as long as she takes care of it and keeps it from getting wet. She is gonna have some drainage we can definitely pass this up with Drawtex and alginate to try to prevent as much drainage as possible from causing the problems. With that being said I do want to at least try [name] with the cast between now and Tuesday. If there any issues we can't continue to use it then I will  discontinue the use of the cast at that point. 01/08/19 on evaluation today patient actually appears to be doing very well as far as [name] foot ulcer specifically the great toe on the left is concerned. She did have an area of rubbing on the medial aspect of [name] left ankle which again is from the cast. Fortunately there's no signs of infection at this point in this appears to be [name] very slight skin breakdown. The patient tells me she felt it rubbing but didn't think it was that bad. Fortunately there is no signs of active infection at this time which is good news. No fevers, chills, nausea, or vomiting noted at this time. 01/15/19 on evaluation today patient actually appears to be doing well in regard to [name] toe ulcer. Again as previous she seems to do well and she has the cast on which indicates to me that during the time she doesn't have [name] cast on she's putting way too much pressure on this region. Obviously I think that's gonna be an issue as with the current national emergency concerning the Covid-19 Virus it has been recommended that we discontinue the use of total contact casting by the chief medical officer of our company, Dr. Simona Huh. The reasoning is that if [name] patient becomes sick and cannot come into have the cast removed they could not just leave this on for an additional two weeks. Obviously the hospitals also do not want to receive patient's who are sick into the emergency department to potentially contaminate the region and spread the Covid-19 Virus among other sick individuals within the hospital system. Therefore at this point we are suspending the use of total contact cast until the current emergency subsides. This was all discussed with the patient today as well. 01/22/19 on evaluation today patient's wound on [name] left great toe appears to be doing slightly worse than previously noted last week. She tells me that she has been on this quite [name] bit in fact she tells me she's been awake for 38  straight hours. This is due to the fact that she's having to care for grandparents because nobody else will. She has been taking care of them for five the last seven days since I've seen [name] they both have dementia his is from [name] stroke and [name] grandmother's was progressive. Nonetheless she states even [name] mom who knows [name] condition and situation has only help two of those days to take care of them she's been taking care of the rest. Fortunately there does not appear to be any signs of active infection in regard to [name] toe at this point although obviously it doesn't look as good as it did previous. I think this is directly related to [name] not taking off the pressure and friction by way of taking things easy. Though I completely understand what's going on. 01/29/19 on evaluation today patient's tools are actually appears to be showing some signs of improvement today compared to last week's evaluation  as far as not necessarily the overall size of the wound but the fact that she has some new skin growth in between the two ends of the wound opening. Overall I feel like she has done well she states that she had [name] family member give [name] what sounds to be [name] CAM walker boot which has been helpful as well. 02/05/19 on evaluation today patient's wound bed actually appears to be doing significantly better in regard to [name] overall appearance of the size of the wound. With that being said she is still having an issue with offloading efficiently enough to get this to close. Apparently there is some signs of infection at this point as well unfortunately. Previously she's done well of Augmentin I really do not see anything that needs to be culture currently but there theme and cellulitis of the foot that I'm seeing I'm gonna go ahead and place [name] on an antibiotic today to try to help clear this up. 02/12/2019 on evaluation today patient actually appears to be doing poorly in regard to [name] overall wound status. She tells  me she has been using [name] offloading shoe but actually comes in today wearing [name] tennis shoe with the AFO brace. Again as I previously discussed with [name] this is really not sufficient to allow the area to heal appropriately. Nonetheless she continues to be somewhat noncompliant and I do wonder based on what she has told my nurse in the past as to whether or not [name] good portion of this noncompliance may be recreational drug and alcohol related. She has had [name] history of heroin overdose and this was fairly recently in the past couple of months that have been seeing [name] Nonetheless overall I feel like [name] wound looks significantly worse today compared to what it was previous. She still has significant erythema despite the Augmentin I am not sure that this is an appropriate medication for [name] infection I am also concerned that the infection may have gone down into [name] bone. 02/19/19 on evaluation today patient actually appears to be doing about the same in regard to [name] toe ulcer. Unfortunately she continues to show signs of bone exposure and infection at this point. There does not appear to be any evidence of worsening of the infection but I'm also not really sure that it's getting significantly better. She is on the Augmentin which should be sufficient for the Staphylococcus aureus infection that she has at this point. With that being said she may need IV antibiotics to more appropriately treat this. We did have [name] discussion today about hyperbaric option therapy. 02/28/19 on evaluation today patient actually appears to be doing much worse in regard to the wound on [name] left great toe as compared to even my previous evaluation last week. Unfortunately this seems to be training in [name] pretty poor direction. [name] toe was actually now starting to angle laterally and I can actually see the entire joint area of the proximal portion of the digit where is the distal portion of the digit again is no longer even in  contact with the joint line. Unfortunately there's [name] lot more necrotic tissue around the edge and the toe appears to be showing signs of becoming gangrenous in my pinion. I'm very concerned about were things stand at this point. She did see infectious disease and they are planning to send in [name] prescription for Sivextro for [name] and apparently this has been approved. With that being said I don't think she should avoid  taking this but at the same time I'm not sure that it's gonna be sufficient to save [name] toe at this point. She tells me that she still having to care for grandparents which I think is putting quite [name] bit of strain on [name] foot and specifically the total area and has caused this to break down even to [name] greater degree than would've otherwise been expected. 03/05/19 on evaluation today patient actually appears to be doing quite well in regard to [name] toe all things considering. She still has bone exposed but there appears to be much less your thing on overall the appearance of the wound and the toe itself is dramatically improved. She still does have some issues currently obviously with infection she did see vascular as well and there concerned that [name] blood flow to the toad. For that reason they are setting up for an angiogram next week. 03/14/19 on evaluation today patient appears to be doing very poor in regard to [name] toe and specifically in regard to the ulceration and the fact that she's starting to notice the toe was leaning even more towards the lateral aspect and the complete joint is visible on the proximal aspect of the joint. Nonetheless she's also noted [name] significant odor and the tip of the toe is turning more dark and necrotic appearing. Overall I think she is getting worse not better as far as this is concerned. For that reason I am recommending at this point that she likely needs to be seen for likely amputation. READMISSION 03/19/2021 This is [name] patient that we cared for in this  clinic for [name] prolonged period of time in 2019 and 2020 with [name] left foot and left first toe wound. 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 ([id number]) 04/09/20 by Dr. Luana Shu. In December 2021 she had an ulcer on [name] right great toe as well as the fourth and fifth toes. 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 She also had an angiogram at that time and underwent angioplasty of the right anterior tibial artery. In any case she claims that the wound on the right foot is closed I did not look at this today which was probably an oversight although I think that should be done next week. After [name] surgery she developed [name] dehiscence but I do not see any follow-up of this. According to Dr. Deborra Medina last review that she was out of the area being cared for by another physician but recently came back to his attention. 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 [name] culture of this area showed E. coli apparently before she came back to see Dr. Luana Shu she was supposed to be receiving antibiotics but she did not really take them. Nor is she offloading this area at all. Finally [name] last hemoglobin A1c listed in epic was in March 2022 at 14.1 she says things are [name] lot better since then although I am not sure. 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 She had this wound at the time. During this admission she had E. coli sepsis [name] TEE was negative. 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 She is still [name] heavy  cigarette smoker. She has [name] history of bipolar disorder seizure disorder ABI in our clinic was 1.05 6/1; left midfoot in the setting of [name] TMA done previously. Round circular wound with [name] "knuckle" of protruding tissue. The problem is that the knuckle was not attached to any of the surrounding granulation and this probed proximally widely I removed [name] large portion of this tissue. This wound goes with considerable undermining laterally. I do not feel any bone there was no purulence but this is [name] deep wound. 6/8; in spite of the debridement I did last week. She arrives with [name] wound looking exactly the same. [name] protruding "knuckle" of tissue nonadherent to most of the surrounding tissue. There is considerable depth around this from 6-12 o'clock at 2.7 cm and undermining of 1 cm. This does not look overtly infected and the x-ray I did last week was negative for any osseous abnormalities. We have been using silver collagen 6/15; deep tissue culture I did last week showed moderate staph aureus and moderate Pseudomonas. This will definitely require prolonged antibiotic therapy. The pathology on the protuberant area was negative for malignancy fungus etc. 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 We have been using silver collagen. I am going to be prescribing Levaquin for 2 weeks. [name] CT scan of the foot is down for 7/5 6/22; CT scan of the foot on 7 5. She says she has hardware in the left leg from [name] previous fracture. She is on the Levaquin for the deep tissue culture I did that showed methicillin sensitive staph aureus and Pseudomonas. I gave [name] [name] 2-week supply and she will have another week. She arrives in clinic today with the same protuberant tissue however this is nonadherent to the tissue surrounding it. I am really at [name] loss to explain this unless there is underlying deep tissue infection 6/29; patient presents for 1 week follow-up. She  has been using collagen to the wound bed. She reports taking [name] antibiotics as prescribed.She has no complaints or issues today. She denies signs of infection. 7/6; patient presents for one week followup. She has been using collagen to the wound bed. She states she is taking Levaquin however at times she is not able to keep it down. She denies signs of infection. 7/13; patient presents for 1 week follow-up. She has been using silver alginate to the wound bed. She still has nausea when taking [name] antibiotics. She denies signs of infection. 7/20; patient presents for 1 week follow-up. She has been using silver alginate with gentamicin cream to the wound bed. She denies any issues and has no complaints today. She denies signs of infection. 7/27; patient presents for 1 week follow-up. She continues to use silver alginate with gentamicin cream to the wound bed. She reports starting [name] antibiotics. She has no issues or complaints. Overall she reports stability to the wound. 8/3; patient presents for 1 week follow-up. She has been using silver alginate with gentamicin cream to the wound bed. She reports completing all antibiotics. She has no issues or complaints today. She denies signs of infection. Electronic Signature(s) Signed: 05/26/2021 3:31:08 PM By: Kalman Shan DO Entered By: Kalman Shan on 05/26/2021 15:08:00 [name] ([id number]) -------------------------------------------------------------------------------- Physical Exam Details Patient Name: [name] Date of Service: 05/26/2021 2:00 PM Medical Record Number: [id number] Patient Account Number: [id number] Date of  Birth/Sex: Jul 29, 1976 (45 y.o. F) Treating RN: Dolan Amen Primary Care Provider: Raelene Bott Other Clinician: Referring Provider: Raelene Bott Treating Provider/Extender: Yaakov Guthrie in Treatment: 9 Constitutional . Psychiatric . Notes Left foot: Wound to the plantar aspect that  that has granulation tissue and nonviable tissue present. There is significant callus circumferentially. Electronic Signature(s) Signed: 05/26/2021 3:31:08 PM By: Kalman Shan DO Entered By: Kalman Shan on 05/26/2021 15:09:30 [name] ([id number]) -------------------------------------------------------------------------------- Physician Orders Details Patient Name: [name] Date of Service: 05/26/2021 2:00 PM Medical Record Number: [id number] Patient Account Number: [id number] Date of Birth/Sex: 01/08/1976 (45 y.o. F) Treating RN: Dolan Amen Primary Care Provider: Raelene Bott Other Clinician: Referring Provider: Raelene Bott Treating Provider/Extender: Yaakov Guthrie in Treatment: 9 Verbal / Phone Orders: No Diagnosis Coding Follow-up Appointments o Return Appointment in 2 weeks. Bathing/ Shower/ Hygiene o Clean wound with Normal Saline or wound cleanser. Off-Loading Left Lower Extremity o Open toe surgical shoe with peg assist. o Turn and reposition every 2 hours Wound Treatment Wound #7 - Foot Wound Laterality: Plantar, Left, Midline Cleanser: Soap and Water 3 x Per Week/30 Days Discharge Instructions: Gently cleanse wound with antibacterial soap, rinse and pat dry prior to dressing wounds Peri-Wound Care: Desitin Maximum Strength Ointment 4 (oz) 3 x Per Week/30 Days Discharge Instructions: peri wound Topical: Gentamicin 3 x Per Week/30 Days Discharge Instructions: Apply to wound bed and tunnel area under the dressing Primary Dressing: Silvercel Small 2x2 (in/in) 3 x Per Week/30 Days Discharge Instructions: Apply to wound bed Secondary Dressing: ABD Pad 5x9 (in/in) 3 x Per Week/30 Days Discharge Instructions: Cover with ABD pad on top of dressing Secondary Dressing: Gauze 3 x Per Week/30 Days Discharge Instructions: To hold dressing in place Secured With: Kerlix Roll Sterile or Non-Sterile 6-ply 4.5x4 (yd/yd) 3 x Per Week/30  Days Discharge Instructions: Apply Kerlix as directed Radiology o X-ray, foot - Left foot Electronic Signature(s) Signed: 05/26/2021 3:31:08 PM By: Kalman Shan DO Signed: 05/26/2021 4:40:35 PM By: Dolan Amen RN Entered By: Dolan Amen on 05/26/2021 15:23:08 [name] ([id number]) -------------------------------------------------------------------------------- Problem List Details Patient Name: [name] Date of Service: 05/26/2021 2:00 PM Medical Record Number: [id number] Patient Account Number: [id number] Date of Birth/Sex: 02/23/1976 (45 y.o. F) Treating RN: Dolan Amen Primary Care Provider: Raelene Bott Other Clinician: Referring Provider: Raelene Bott Treating Provider/Extender: Yaakov Guthrie in Treatment: 9 Active Problems ICD-10 Encounter Code Description Active Date MDM Diagnosis E11.621 Type 2 diabetes mellitus with foot ulcer 03/19/2021 No Yes 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 03/19/2021 No Yes severity E11.42 Type 2 diabetes mellitus with diabetic polyneuropathy 03/19/2021 No Yes Inactive Problems Resolved Problems Electronic Signature(s) Signed: 05/26/2021 3:31:08 PM By: Kalman Shan DO Entered By: Kalman Shan on 05/26/2021 [place], Zaidee [name]. ([id number]) -------------------------------------------------------------------------------- Progress Note Details Patient Name: [name] Date of Service: 05/26/2021 2:00 PM Medical Record Number: [id number] Patient Account Number: [id number] Date of Birth/Sex: [birth year] (45 y.o. F) Treating RN: Dolan Amen Primary Care Provider: Raelene Bott Other Clinician: Referring Provider: Raelene Bott Treating Provider/Extender: Yaakov Guthrie in Treatment: 9 Subjective Chief Complaint Information obtained from Patient Left great toe ulcer 03/19/2021; patient referred by Dr. Luana Shu who has been looking after  [name] left foot for quite [name] period of time for review of [name] nonhealing area in the left midfoot History of Present Illness (HPI) 01/18/18-She is here for initial evaluation of the left great toe ulcer. She  is [name] poor historian in regards to timeframe in detail. She states approximately 4 weeks ago she lacerated [name] toe on something in the house. She followed up with [name] primary care who placed [name] on Bactrim and ultimately [name] second dose of Bactrim prior to coming to wound clinic. She states she has been treating the toe with peroxide, Betadine and [name] Band-Aid. She did not check [name] blood sugar this morning but checked it yesterday morning it was 327; she is unaware of [name] recent A1c and there are no current records. She saw Dr. she would've orthopedics last week for an old injury to the left ankle, she states he did not see [name] toe, nor did she bring it to his attention. She smokes approximately 1 pack cigarettes [name] day. [name] social situation is concerning, she arrives this morning with [name] mother who appears extremely intoxicated/under the influence; [name] mother was asked to leave the room and be monitored by the patient's grandmother. The patient's aunt then accompanied the patient and the room throughout the rest of the appointment. We had [name] lengthy discussion regarding the deleterious effects of uncontrolled hyperglycemia and smoking as it relates to wound healing and overall health. She was strongly encouraged to decrease [name] smoking and get [name] diabetes under better control. She states she is currently on [name] diet and has cut down [name] [facility] consumption. The left toe is erythematous, macerated and slightly edematous with malodor present. The edema in [name] left foot is below [name] baseline, there is no erythema streaking. We will treat [name] with Santyl, doxycycline; we have ordered and xray, culture and provided [name] Peg assist surgical shoe and cultured the wound. 01/25/18-She is here in follow-up evaluation  for [name] left great toe ulcer and presents with an abscess to [name] suprapubic area. She states [name] blood sugars remain elevated, feeling "sick" and if levels are below 250, but she is trying. She has made no attempt to decrease [name] smoking stating that we "can't take away [name] food in [name] cigarettes". She has been compliant with offloading using the PEG assist you. She is using Santyl daily. the culture obtained last week grew staph aureus and Enterococcus faecalis; continues on the doxycycline and Augmentin was added on Monday. The suprapubic area has erythema, no femoral variation, purple discoloration, minimal induration, was accessed with [name] cotton tip applicator with sanguinopurulent drainage, this was cultured, I suspect the current antibiotic treatment will cover and we will not add anything to [name] current treatment plan. She was advised to go to urgent care or ER with any change in redness, induration or fever. 02/01/18-She is here in follow-up evaluation for left great toe ulcers and [name] new abdominal abscess from last week. She was able to use packing until earlier this week, where she "forgot it was there". She states she was feeling ill with GI symptoms last week and was not taking [name] antibiotic. She states [name] glucose levels have been predominantly less than 200, with occasional levels between 200-250. She thinks this was contributing to [name] GI symptoms as they have resolved without intervention. There continues to be significant laceration to left toe, otherwise it clinically looks stable/improved. There is now less superficial opening to the lateral aspect of the great toe that was residual blister. We will transition to [facility] to all wounds, she will continue [name] Augmentin. If there is no change or deterioration next week for reculture. 02/08/18-She is here in follow-up evaluation for left great toe ulcer and  abdominal ulcer. There is an improvement in both wounds. 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 She states [name] glucose levels have been high over this last week ranging from 200-350, she continues to smoke. She admits to being less compliant with [name] offloading shoe. We will continue with same treatment plan and she will follow-up next week. 02/15/18-She is here in follow-up evaluation for left great toe ulcer and abdominal ulcer. The abdominal ulcer is epithelialized. The left great toe ulcer is improved and all injury from last week using the Coban wrap is resolved, the lateral ulcer is healed. She admits to noncompliance with wearing offloading shoe and admits to glucose levels being greater than 300 most of the week. She continues to smoke and expresses no desire to quit. There is one area medially that probes deeper than it has historically, erythema to the toe and dorsal foot has consistently waxed and waned. There is no overt signs of cellulitis or infection but we will culture the wound for any occult infection given the new area of depth and erythema. We will hold off on sensitivities for initiation of antibiotic therapy. 02/22/18-She is here in follow up evaluation for left great toe ulcer. There is overall significant improvement in both wound appearance, erythema and edema with changes made last week. She was not initiated on antibiotic therapy. Culture obtained last week showed oxacillin sensitive staph aureus, sensitive to clindamycin. Clindamycin has been called into the pharmacy but she has been instructed to hold off on initiation secondary to overall clinical improvement and [name] history of antibiotic intolerance. She has been instructed to contact the clinic with any noted changes/deterioration and the wound, erythema, edema and/or pain. She will follow-up next week. She continues to smoke and [name] glucose  levels remain elevated >250; she admits to compliance with offloading shoe 03/01/18 on evaluation today patient appears to be doing fairly well in regard to [name] left first toe ulcer. She has been tolerating the dressing changes with the [facility] Dressing without complication and overall this has definitely showed signs of improvement according to records as well is what the patient tells me today. I'm very pleased in that regard. She is having no pain today 03/08/18 She is here for follow up evaluation of [name] left great toe ulcer. She remains non-compliant with glucose control and smoking cessation; glucose levels consistently >200. She states that she got new shoe inserts/peg assist. She admits to compliance with offloading. Since my last evaluation there is significant improvement. We will switch to prisma at this time and she will follow up next week. She is noted to be tachycardic at this appointment, heart rate 120s; she has [name] history of heart rate 70-130 according to our records. She admits to extreme agitation r/t personal issues; she was advised to monitor [name] heartrate and contact [name] physician if it does not return to [name] more normal range (<100). She takes cardizem twice daily. 03/15/18-She is here in follow-up evaluation for left great toe ulcer. She remains noncompliant with glucose control and smoking cessation. She admits to compliance with wearing offloading shoe. The ulcer is improved/stable and we will continue with the same treatment plan and she will follow-up next week 03/22/18-She is here for evaluation for left great toe ulcer. There continues to be significant improvement despite recurrent hyperglycemia (over  500 yesterday) and she continues to smoke. She has been compliant with offloading and we will continue with same treatment plan and she will follow-up next week. Ahlberg, Lakenya [name]. ([id number]) 03/29/18-She is here for evaluation for left great toe ulcer. Despite continuing to  smoke and uncontrolled diabetes she continues to improve. She is compliant with offloading shoe. We will continue with the same treatment plan and she will follow-up next week 04/05/18- She is here in follow up evaluation for [name] left great toe ulcer; she presents with small pustule to left fifth toe (resembles ant bite). She admits to compliance with wearing offloading shoe; continues to smoke or have uncontrolled blood glucose control. There is more callus than usual with evidence of bleeding; she denies known trauma. 04/12/18-She is here for evaluation of left great toe ulcer. Despite noncompliance with glycemic control and smoking she continues to make improvement. She continues to wear offloading shoe. The pustule, that was identified last week, to the left fifth toe is resolved. She will follow-up in 2 weeks 05/03/18-she is seen in follow-up evaluation for [name] left great toe ulcer. She is compliant with offloading, otherwise noncompliant with glycemic control and smoking. She has plateaued and there is minimal improvement noted. We will transition to [facility], replaced the insert to [name] surgical shoe and she will follow-up in one week 05/10/18- She is here in follow up evaluation for [name] left great toe ulcer. It appears stable despite measurement change. We will continue with same treatment plan and follow up next week. 05/24/18-She is seen in follow-up evaluation for [name] left great toe ulcer. She remains compliant with offloading, has made significant improvement in [name] diet, decreasing the amount of sugar/soda. She said [name] recent A1c was 10.9 which is lower than. She did see [name] diabetic nutritionist/educator yesterday. She continues to smoke. We will continue with the same treatment plan and she'll follow-up next week. 05/31/18- She is seen in follow-up evaluation for left great toe ulcer. She continues to remain compliant with offloading, continues to make improvement in [name] diet, increasing [name]  water and decreasing the amount of sugar/soda. She does continue to smoke with no desire to quit. We will apply Prisma to the depth and Hydrofera Blue over. We have not received insurance authorization for oasis. She will follow up next week. 06/07/18-She is seen in follow-up evaluation for left great toe ulcer. It has stalled according to today's measurements although base appears stable. She says she saw [name] diabetic educator yesterday; [name] average blood sugars are less than 300 which is an improvement for [name] She continues to smoke and states "that's my next step" She continues with water over soda. We will order for xray, culture and reinstate ace wrap compression prior to placing apligraf for next week. She is voicing no complaints or concerns. [name] dressing will change to iodoflex over the next week in preparation for apligraf. 06/14/18-She is seen in follow-up evaluation for left great toe ulcer. Plain film x-ray performed last week was negative for osteomyelitis. Wound culture obtained last week grew strep B and OSSA; she is initiated on keflex and cefdinir today; there is erythema to the toe which could be from ace wrap compression, she has [name] history of wrapping too tight and has has been encouraged to maintain ace wraps that we place today. We will hold off on application of apligraf today, will apply next week after antibiotic therapy has been initiated. She admits today that she has resumed taking [name] shower with  [name] foot/toe submerged in water, she has been reminded to keep foot/toe out of the bath water. She will be seen in follow up next week 06/21/18-she is seen in follow-up evaluation for left great toe ulcer. She is tolerating antibiotic therapy with no GI disturbance. The wound is stable. Apligraf was applied today. She has been decreasing [name] smoking, only had 4 cigarettes yesterday and 1 today. She continues being more compliant in diabetic diet. She will follow-up next week for  evaluation of site, if stable will remove at 2 weeks. 06/28/18- She is here in follow up evalution. Apligraf was placed last week, she states the dressing fell off on Tuesday and she was dressing with hydrofera blue. She is healed and will be discharged from the clinic today. She has been instructed to continue with smoking cessation, continue monitoring glucose levels, offloading for an additional 4 weeks and continue with hydrofera blue for additional two weeks for any possible microscopic opening. Readmission: 08/07/18 on evaluation today patient presents for reevaluation concerning the ulcer of [name] right great toe. She was previously discharged on 06/28/18 healed. Nonetheless she states that this began to show signs of drainage she subsequently went to [name] primary care provider. Subsequently an x-ray was performed on 08/01/18 which was negative. The patient was also placed on antibiotics at that time. Fortunately they should have been effective for the infection. Nonetheless she's been experiencing some improvement but still has [name] lot of drainage coming from the wound itself. 08/14/18 on evaluation today patient's wound actually does show signs of improvement in regard to the erythema at this point. She has completed the antibiotics. With that being said we did discuss the possibility of placing [name] in [name] total contact cast as of today although I think that I may want to give this just [name] little bit more time to ensure nothing recurrence as far as [name] infection is concerned. I do not want to put in the cast and risk infection at that time if things are not completely resolved. With that being said she is gonna require some debridement today. 08/21/18 on evaluation today patient actually appears to be doing okay in regard to [name] toe ulcer. She's been tolerating the dressing changes without complication. With that being said it does appear that she is ready and in fact I think it's appropriate for [place] to  go ahead and initiate the total contact cast today. Nonetheless she will require some sharp debridement to prepare the wound for application. Overall I feel like things have been progressing well but we do need to do something to get this to close more readily. 08/24/18 patient seen today for reevaluation after having had the total contact cast applied on Tuesday. She seems to have done very well the wound appears to be doing great and overall I'm pleased with the progress that she's made. There were no abnormal areas of rubbing from the cast on [name] lower extremity. 08/30/18 on evaluation today patient actually appears to be completely healed in regard to [name] plantar toe ulcer. She tells me at this point she's been having [name] lot of issues with the cast. She almost fell [name] couple of times the state shall the step of [name] dog [name] couple times as well. This is been [name] very frustrating process for [name] other nonetheless she has completely healed the wound which is excellent news. Overall there does not appear to be the evidence of infection at this time which is great news. 09/11/18 evaluation  today patient presents for follow-up concerning [name] great toe ulcer on the left which has unfortunately reopened since I last saw [name] which was only [name] couple of weeks ago. Unfortunately she was not able to get in to get the shoe and potentially the AFO that's gonna be necessary due to [name] left foot drop. She continues with offloading shoe but this is not enough to prevent [name] from reopening it appears. When we last had [name] in the total contact cast she did well from [name] healing standpoint but unfortunately the wound reopened as soon as she came out of the cast within just [name] couple of weeks. 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 This is also part of the reason why she falls on [name] regular basis. I just do not believe that is gonna be safe for [place] to reinitiate the total contact cast as last time we had this on she fell 3 times one week which is definitely not normal for [name] 09/18/18 upon evaluation today the patient actually appears to be doing about the same in regard to [name] toe ulcer. She did not contact [employer] as I asked [name] to even though I had given [name] the prescription. In fact she actually states that she has no idea where the prescription is. She did apparently call [employer] and they told [name] that all she needed to do was bring the prescription in order to be able to be seen and work on getting the AFO for [name] left foot. With all that being said she still does not have an appointment and I'm not sure were things stand that regard. I will give [name] [name] new prescription today in order to contact them to get this set up. [name], [name] ([id number]) 09/25/18 on evaluation today patient actually appears to be doing about the same in regard to [name] toes ulcer. She does have [name] small areas which seems to have [name] lot of callous buildup around the edge of the wound which is going to need sharp debridement today. She still is waiting to be scheduled for evaluation with [employer] for possibility of an AFO. She states there supposed to call [name] tomorrow to get this set up. Unfortunately it does appear that [name] foot specifically the toe area is showing signs of erythema. There does not appear to be any systemic infection which is in these good news. 10/02/18 on evaluation today patient actually appears to be doing about the same in regard to [name] toe ulcer. This really has not done too well although it's not significantly larger it's also not significantly smaller. She has been tolerating the dressing changes without complication. She actually has [name] appointment with [employer] and [place] tomorrow to hopefully be measured for  obtaining and AFO splint. I think this would be helpful preventing this from reoccurring. We had contemplated starting the cast this week although to be honest I am reluctant to do that as she's been having nausea, vomiting, and seizure activity over the past three days. She has [name] history of seizures and have been told is nothing that can be done for these. With that being said I do believe that along with the seizures have the nausea vomiting which upon further questioning doesn't seem to be the normal  for [name] and makes me concerned for the possibility of infection or something else going on. I discussed this with the patient and [name] mother during the office visit today. I do not feel the wound is effective but maybe something else. The responses this was "this just happens to [name] at times and we don't know why". They did not seem to be interested in going to the hospital to have this checked out further. 10/09/18 on evaluation today patient presents for follow-up concerning [name] ongoing toe ulcer. She has been tolerating the dressing changes without complication. Fortunately there does not appear to be any evidence of infection which is great news however I do think that the patient would benefit from going ahead for with the total contact cast. She's actually in [name] wheelchair today she tells me that she will use [name] walker if we initiate the cast. I was very specific about the fact that if we were gonna do the cast I wanted to make sure that she was using the walker in order to prevent any falls. She tells me she does not have stairs that she has to traverse on [name] regular basis at [name] home. She has not had any seizures since last week again that something that happens to [name] often she tells me she did talk to [employer] and they said that it may take up to three weeks to get the brace approved for [name] Hopefully that will not take that long but nonetheless in the meantime I do think the cast could be  of benefit. 10/12/18 on evaluation today patient appears to be doing rather well in regard to [name] toe ulcer. It's just been [name] few days and already this is significantly improved both as far as overall appearance and size. Fortunately there's no sign of infection. She is here for [name] first obligatory cast change. 10/19/18 Seen today for follow up and management of left great toe ulcer. Wound continues to show improvement. Noted small open area with seroussang drainage with palpation. Denies any increased pain or recent fevers during visit. She will continue calcium alginate with offloading shoe. Denies any questions or concerns during visit. 10/26/18 on evaluation today patient appears to be doing about the same as when I last saw [name] in regard to [name] wound bed. Fortunately there does not appear to be any signs of infection. Unfortunately she continues to have [name] breakdown in regard to the toe region any time that she is not in the cast. It takes almost no time at all for this to happen. Nonetheless she still has not heard anything from the brace being made by [employer] as to when exactly this will be available to [name] Fortunately there is no signs of infection at this time. 10/30/18 on evaluation today patient presents for application of the total contact cast as we just received him this morning. Fortunately we are gonna be able to apply this to [name] today which is great news. She continues to have no significant pain which is good news. Overall I do feel like things have been improving while she was the cast is when she doesn't have [name] cast that things get worse. She still has not really heard anything from [place] regarding [name] brace. 11/02/18 upon evaluation today patient's wound already appears to be doing significantly better which is good news. Fortunately there does not appear to be any signs of infection also good news. Overall I do think the total contact cast as before is helping  to heal this area  unfortunately it's just not gonna likely keep the area closed and healed without [name] getting [name] brace at least. Again the foot drop is [name] significant issue for [name] 11/09/18 on evaluation today patient appears to be doing excellent in regard to [name] toe ulcer which in fact is completely healed. Fortunately we finally got the situation squared away with the paperwork which was needed to proceed with getting [name] brace approved by Medicaid. I have filled that out unfortunately that information has been sent to the orthopedic office that I worked at 2 1/2 years ago and not tired Current wound care measures. Fortunately she seems to be doing very well at this time. 11/23/18 on evaluation today patient appears to be doing More Poorly Compared to Last Time I Saw [name] At [facility] She Had Completely Healed. Currently she is continuing to have issues with reopening. She states that she just found out that the brace was approved through Medicaid now she just has to go get measured in order to have this fitted for [name] and then made. Subsequently she does not have an appointment for this yet that is going to complicate things we obviously cannot put [name] back in the cast if we do not have everything measured because they're not gonna be able to measure [name] foot while she is in the cast. 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 Obviously this is unfortunate and does have me somewhat worried as well. 11/30/18 on evaluation today patient's toe ulcer actually appears to be doing fairly well. The good news is she will be getting [name] brace in the shoes next week on Wednesday. Hopefully we will be able to get this to heal without having to go back in the cast however she may need the cast in order to get the wound completely heal and then go from there. Fortunately there's no signs of infection at this time. 12/07/18 on  evaluation today patient fortunately did receive [name] brace and she states she could tell this definitely makes [name] walk better. With that being said she's been having issues with [name] toe where she noticed yesterday there was [name] lot of tissue that was loosing off this appears to be much larger than what it was previous. She also states that [name] leg has been read putting much across the top of [name] foot just about the ankle although this seems to be receiving somewhat. The total area is still red and appears to be someone infected as best I can tell. She is previously taken Bactrim and that may be [name] good option for [name] today as well. We are gonna see what I wound culture shows as well and I think that this is definitely appropriate. With that being said outside of the culture I still need to initiate something in the interim and that's what I'm gonna go ahead and select Bactrim is [name] good option for [name] 12/14/18 on evaluation today patient appears to be doing better in regard to [name] left great toe ulcer as compared to last week's evaluation. There's still some erythema although this is significantly improved which is excellent news. Overall I do believe that she is making good progress is still gonna take some time before she is where I would like [name] to be from the standpoint of being able to place [name] back into the total  contact cast. Hopefully we will be where we need to be by next week. 12/21/18 on evaluation today patient actually appears to be doing poorly in regard to [name] toe ulcer. She's been tolerating the dressing changes without complication. Fortunately there's no signs of systemic infection although she does have [name] lot of drainage from the toe ulcer and this does Rickenbach, [name] ([id number]) seem to be causing some issues at this point. She does have erythema on the distal portion of [name] toe that appears to be likely cellulitis. 12/28/18 on evaluation today patient actually appears to be doing [name]  little better in my pinion in regard to [name] toe ulcer. With that being said she still does have some evidence of infection at this time and for [name] culture she had both E. coli as well as enterococcus as organisms noted on evaluation. For that reason I think that though the Keflex likely has treated the E. coli rather well this has really done nothing for the enterococcus. We are going to have to initiate treatment for this specifically. 01/04/19 on evaluation today patient's toe actually appears to be doing better from the standpoint of infection. She currently would like to see about putting the cash back on I think that this is appropriate as long as she takes care of it and keeps it from getting wet. She is gonna have some drainage we can definitely pass this up with Drawtex and alginate to try to prevent as much drainage as possible from causing the problems. With that being said I do want to at least try [name] with the cast between now and Tuesday. If there any issues we can't continue to use it then I will discontinue the use of the cast at that point. 01/08/19 on evaluation today patient actually appears to be doing very well as far as [name] foot ulcer specifically the great toe on the left is concerned. She did have an area of rubbing on the medial aspect of [name] left ankle which again is from the cast. Fortunately there's no signs of infection at this point in this appears to be [name] very slight skin breakdown. The patient tells me she felt it rubbing but didn't think it was that bad. Fortunately there is no signs of active infection at this time which is good news. No fevers, chills, nausea, or vomiting noted at this time. 01/15/19 on evaluation today patient actually appears to be doing well in regard to [name] toe ulcer. Again as previous she seems to do well and she has the cast on which indicates to me that during the time she doesn't have [name] cast on she's putting way too much pressure on this  region. Obviously I think that's gonna be an issue as with the current national emergency concerning the Covid-19 Virus it has been recommended that we discontinue the use of total contact casting by the chief medical officer of our company, Dr. Simona Huh. The reasoning is that if [name] patient becomes sick and cannot come into have the cast removed they could not just leave this on for an additional two weeks. Obviously the hospitals also do not want to receive patient's who are sick into the emergency department to potentially contaminate the region and spread the Covid-19 Virus among other sick individuals within the hospital system. Therefore at this point we are suspending the use of total contact cast until the current emergency subsides. This was all discussed with the patient today as well. 01/22/19 on evaluation  today patient's wound on [name] left great toe appears to be doing slightly worse than previously noted last week. She tells me that she has been on this quite [name] bit in fact she tells me she's been awake for 38 straight hours. This is due to the fact that she's having to care for grandparents because nobody else will. She has been taking care of them for five the last seven days since I've seen [name] they both have dementia his is from [name] stroke and [name] grandmother's was progressive. Nonetheless she states even [name] mom who knows [name] condition and situation has only help two of those days to take care of them she's been taking care of the rest. Fortunately there does not appear to be any signs of active infection in regard to [name] toe at this point although obviously it doesn't look as good as it did previous. I think this is directly related to [name] not taking off the pressure and friction by way of taking things easy. Though I completely understand what's going on. 01/29/19 on evaluation today patient's tools are actually appears to be showing some signs of improvement today compared to last  week's evaluation as far as not necessarily the overall size of the wound but the fact that she has some new skin growth in between the two ends of the wound opening. Overall I feel like she has done well she states that she had [name] family member give [name] what sounds to be [name] CAM walker boot which has been helpful as well. 02/05/19 on evaluation today patient's wound bed actually appears to be doing significantly better in regard to [name] overall appearance of the size of the wound. With that being said she is still having an issue with offloading efficiently enough to get this to close. Apparently there is some signs of infection at this point as well unfortunately. Previously she's done well of Augmentin I really do not see anything that needs to be culture currently but there theme and cellulitis of the foot that I'm seeing I'm gonna go ahead and place [name] on an antibiotic today to try to help clear this up. 02/12/2019 on evaluation today patient actually appears to be doing poorly in regard to [name] overall wound status. She tells me she has been using [name] offloading shoe but actually comes in today wearing [name] tennis shoe with the AFO brace. Again as I previously discussed with [name] this is really not sufficient to allow the area to heal appropriately. Nonetheless she continues to be somewhat noncompliant and I do wonder based on what she has told my nurse in the past as to whether or not [name] good portion of this noncompliance may be recreational drug and alcohol related. She has had [name] history of heroin overdose and this was fairly recently in the past couple of months that have been seeing [name] Nonetheless overall I feel like [name] wound looks significantly worse today compared to what it was previous. She still has significant erythema despite the Augmentin I am not sure that this is an appropriate medication for [name] infection I am also concerned that the infection may have gone down into [name] bone. 02/19/19  on evaluation today patient actually appears to be doing about the same in regard to [name] toe ulcer. Unfortunately she continues to show signs of bone exposure and infection at this point. There does not appear to be any evidence of worsening of the infection but I'm also not really sure  that it's getting significantly better. She is on the Augmentin which should be sufficient for the Staphylococcus aureus infection that she has at this point. With that being said she may need IV antibiotics to more appropriately treat this. We did have [name] discussion today about hyperbaric option therapy. 02/28/19 on evaluation today patient actually appears to be doing much worse in regard to the wound on [name] left great toe as compared to even my previous evaluation last week. Unfortunately this seems to be training in [name] pretty poor direction. [name] toe was actually now starting to angle laterally and I can actually see the entire joint area of the proximal portion of the digit where is the distal portion of the digit again is no longer even in contact with the joint line. Unfortunately there's [name] lot more necrotic tissue around the edge and the toe appears to be showing signs of becoming gangrenous in my pinion. I'm very concerned about were things stand at this point. She did see infectious disease and they are planning to send in [name] prescription for Sivextro for [name] and apparently this has been approved. With that being said I don't think she should avoid taking this but at the same time I'm not sure that it's gonna be sufficient to save [name] toe at this point. She tells me that she still having to care for grandparents which I think is putting quite [name] bit of strain on [name] foot and specifically the total area and has caused this to break down even to [name] greater degree than would've otherwise been expected. 03/05/19 on evaluation today patient actually appears to be doing quite well in regard to [name] toe all things considering.  She still has bone exposed but there appears to be much less your thing on overall the appearance of the wound and the toe itself is dramatically improved. She still does have some issues currently obviously with infection she did see vascular as well and there concerned that [name] blood flow to the toad. For that reason they are setting up for an angiogram next week. 03/14/19 on evaluation today patient appears to be doing very poor in regard to [name] toe and specifically in regard to the ulceration and the fact that she's starting to notice the toe was leaning even more towards the lateral aspect and the complete joint is visible on the proximal aspect of the joint. Nonetheless she's also noted [name] significant odor and the tip of the toe is turning more dark and necrotic appearing. Overall I think she is getting worse not better as far as this is concerned. For that reason I am recommending at this point that she likely needs to be seen for likely amputation. [name], [name] ([id number]) READMISSION 03/19/2021 This is [name] patient that we cared for in this clinic for [name] prolonged period of time in 2019 and 2020 with [name] left foot and left first toe wound. 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 Luana Shu. In December 2021 she had an ulcer on [name] right great toe as well as the fourth and fifth toes. 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 She also had an angiogram at that time and underwent angioplasty of the right anterior tibial artery. In any case she claims that the wound on the right foot is closed I did not look at  this today which was probably an oversight although I think that should be done next week. After [name] surgery she developed [name] dehiscence but I do not see any follow-up of this. According to Dr. Deborra Medina last review that she was out of the  area being cared for by another physician but recently came back to his attention. 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 [name] culture of this area showed E. coli apparently before she came back to see Dr. Luana Shu she was supposed to be receiving antibiotics but she did not really take them. Nor is she offloading this area at all. Finally [name] last hemoglobin A1c listed in epic was in March 2022 at 14.1 she says things are [name] lot better since then although I am not sure. 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 She had this wound at the time. During this admission she had E. coli sepsis [name] TEE was negative. 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 She is still [name] heavy cigarette smoker. She has [name] history of bipolar disorder seizure disorder ABI in our clinic was 1.05 6/1; left midfoot in the setting of [name] TMA done previously. Round circular wound with [name] "knuckle" of protruding tissue. The problem is that the knuckle was not attached to any of the surrounding granulation and this probed proximally widely I removed [name] large portion of this tissue. This wound goes with considerable undermining laterally. I do not feel any bone there was no purulence but this is [name] deep wound. 6/8; in spite of the debridement I did last week. She arrives with [name] wound looking exactly the same. [name] protruding "knuckle" of tissue nonadherent to most of the surrounding tissue. There is considerable depth around this from 6-12 o'clock at 2.7 cm and undermining of 1 cm. This does not look overtly infected and the x-ray I did last week was negative for any osseous abnormalities. We  have been using silver collagen 6/15; deep tissue culture I did last week showed moderate staph aureus and moderate Pseudomonas. This will definitely require prolonged antibiotic therapy. The pathology on the protuberant area was negative for malignancy fungus etc. 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 We have been using silver collagen. I am going to be prescribing Levaquin for 2 weeks. [name] CT scan of the foot is down for 7/5 6/22; CT scan of the foot on 7 5. She says she has hardware in the left leg from [name] previous fracture. She is on the Levaquin for the deep tissue culture I did that showed methicillin sensitive staph aureus and Pseudomonas. I gave [name] [name] 2-week supply and she will have another week. She arrives in clinic today with the same protuberant tissue however this is nonadherent to the tissue surrounding it. I am really at [name] loss to explain this unless there is underlying deep tissue infection 6/29; patient presents for 1 week follow-up. She has been using collagen to the wound bed. She reports taking [name] antibiotics as prescribed.She has no complaints or issues today. She denies signs of infection. 7/6; patient presents for one week followup. She has been using collagen to the wound bed. She states she is  taking Levaquin however at times she is not able to keep it down. She denies signs of infection. 7/13; patient presents for 1 week follow-up. She has been using silver alginate to the wound bed. She still has nausea when taking [name] antibiotics. She denies signs of infection. 7/20; patient presents for 1 week follow-up. She has been using silver alginate with gentamicin cream to the wound bed. She denies any issues and has no complaints today. She denies signs of infection. 7/27; patient presents for 1 week follow-up. She continues to use silver alginate with gentamicin cream to the wound bed. She reports starting [name]  antibiotics. She has no issues or complaints. Overall she reports stability to the wound. 8/3; patient presents for 1 week follow-up. She has been using silver alginate with gentamicin cream to the wound bed. She reports completing all antibiotics. She has no issues or complaints today. She denies signs of infection. Patient History Information obtained from Patient. Social History Current every day smoker, Marital Status - Single, Alcohol Use - Never, Drug Use - Prior History, Caffeine Use - Daily. Medical History Ear/Nose/Mouth/Throat Patient has history of Chronic sinus problems/congestion, Middle ear problems Hematologic/Lymphatic Patient has history of Anemia Respiratory Patient has history of Chronic Obstructive Pulmonary Disease (COPD) Cardiovascular Patient has history of Congestive Heart Failure - EF 33% Endocrine Patient has history of Type II Diabetes Genitourinary Patient has history of End Stage Renal Disease Integumentary (Skin) Patient has history of History of pressure wounds Neurologic Patient has history of Neuropathy - feel and legs [name], Lunabelle [name]. ([id number]) Objective Constitutional Vitals Time Taken: 2:41 PM, Height: 69 in, Weight: 185 lbs, BMI: 27.3, Temperature: 98.9 F, Pulse: 88 bpm, Respiratory Rate: 18 breaths/min, Blood Pressure: 107/80 mmHg. General Notes: Left foot: Wound to the plantar aspect that that has granulation tissue and nonviable tissue present. There is significant callus circumferentially. Integumentary (Hair, Skin) Wound #7 status is Open. Original cause of wound was Gradually Appeared. The date acquired was: 08/07/2020. The wound has been in treatment 9 weeks. The wound is located on the [place]. The wound measures 2cm length x 2.2cm width x 1.1cm depth; 3.456cm^2 area and 3.801cm^3 volume. There is Fat Layer (Subcutaneous Tissue) exposed. There is no tunneling or undermining noted. There is [name] large amount of  serosanguineous drainage noted. The wound margin is thickened. There is medium (34-66%) pink granulation within the wound bed. There is [name] medium (34-66%) amount of necrotic tissue within the wound bed including Adherent Slough. Assessment Active Problems ICD-10 Type 2 diabetes mellitus with foot ulcer 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ity Type 2 diabetes mellitus with diabetic polyneuropathy Patient's wound is stable. There was significant callus that was macerated that I debrided today. So the wound will be measuring larger at next clinic visit. I also debrided nonviable tissue in the wound bed. No obvious signs of infection. I recommended continuing silver alginate. Since we have not seen much progress I would like to repeat [name] left foot x-ray. I would like to reassess for osteo. Procedures Wound #7 Pre-procedure diagnosis of Wound #7 is [name] Pressure Ulcer located on the Left,Midline,Plantar Foot . There was [name] Excisional Skin/Subcutaneous Tissue Debridement with [name] total area of 4 sq cm performed by Kalman Shan, MD. With the following instrument(s): Curette to remove Viable and Non-Viable tissue/material. Material removed includes Callus and Subcutaneous Tissue and. [name] time out was conducted at 15:00, prior to the start of the procedure. [name] Minimum amount  of bleeding was controlled with Pressure. The procedure was tolerated well. Post Debridement Measurements: 2cm length x 3.3cm width x 1.1cm depth; 5.702cm^3 volume. Post debridement Stage noted as Category/Stage II. Character of Wound/Ulcer Post Debridement is stable. Post procedure Diagnosis Wound #7: Same as Pre-Procedure Plan Follow-up Appointments: Return Appointment in 2 weeks. Bathing/ Shower/ Hygiene: Clean wound with Normal Saline or wound cleanser. Off-Loading: Open toe surgical shoe with peg assist. Turn and reposition every 2 hours Radiology ordered were: X-ray, foot - Left  foot WOUND #7: - Foot Wound Laterality: Plantar, Left, Midline Detamore, Amaree [name]. ([id number]) Cleanser: Soap and Water 3 x Per Week/30 Days Discharge Instructions: Gently cleanse wound with antibacterial soap, rinse and pat dry prior to dressing wounds Peri-Wound Care: Desitin Maximum Strength Ointment 4 (oz) 3 x Per Week/30 Days Discharge Instructions: peri wound Topical: Gentamicin 3 x Per Week/30 Days Discharge Instructions: Apply to wound bed and tunnel area under the dressing Primary Dressing: Silvercel Small 2x2 (in/in) 3 x Per Week/30 Days Discharge Instructions: Apply to wound bed Secondary Dressing: ABD Pad 5x9 (in/in) 3 x Per Week/30 Days Discharge Instructions: Cover with ABD pad on top of dressing Secondary Dressing: Gauze 3 x Per Week/30 Days Discharge Instructions: To hold dressing in place Secured With: Kerlix Roll Sterile or Non-Sterile 6-ply 4.5x4 (yd/yd) 3 x Per Week/30 Days Discharge Instructions: Apply Kerlix as directed 1. In office sharp debridement 2. Silver alginate with gentamicin 3. Left foot x-ray 4. Follow-up in 2 weeks Electronic Signature(s) Signed: 05/26/2021 3:31:08 PM By: Kalman Shan DO Entered By: Kalman Shan on 05/26/2021 15:30:29 [name] ([id number]) -------------------------------------------------------------------------------- ROS/PFSH Details Patient Name: [name] Date of Service: 05/26/2021 2:00 PM Medical Record Number: [id number] Patient Account Number: [id number] Date of Birth/Sex: [birth year] (45 y.o. F) Treating RN: Dolan Amen Primary Care Provider: Raelene Bott Other Clinician: Referring Provider: Raelene Bott Treating Provider/Extender: Yaakov Guthrie in Treatment: 9 Information Obtained From Patient Ear/Nose/Mouth/Throat Medical History: Positive for: Chronic sinus problems/congestion; Middle ear problems Hematologic/Lymphatic Medical History: Positive for: Anemia Respiratory Medical  History: Positive for: Chronic Obstructive Pulmonary Disease (COPD) Cardiovascular Medical History: Positive for: Congestive Heart Failure - EF 33% Endocrine Medical History: Positive for: Type II Diabetes Time with diabetes: 14 years Treated with: Insulin, Oral agents Blood sugar tested every day: No Blood sugar testing results: Bedtime: 176 Genitourinary Medical History: Positive for: End Stage Renal Disease Integumentary (Skin) Medical History: Positive for: History of pressure wounds Neurologic Medical History: Positive for: Neuropathy - feel and legs HBO Extended History Items Ear/Nose/Mouth/Throat: Ear/Nose/Mouth/Throat: Chronic sinus problems/congestion Middle ear problems Immunizations Pneumococcal Vaccine: Received Pneumococcal Vaccination: No Doerr, Tamlyn [name]. ([id number]) Implantable Devices No devices added Family and Social History Current every day smoker; Marital Status - Single; Alcohol Use: Never; Drug Use: Prior History; Caffeine Use: Daily Electronic Signature(s) Signed: 05/26/2021 3:31:08 PM By: Kalman Shan DO Signed: 05/26/2021 4:40:35 PM By: Dolan Amen RN Entered By: Kalman Shan on 05/26/2021 15:09:06 [name] ([id number]) -------------------------------------------------------------------------------- [place] Details Patient Name: [name] Date of Service: 05/26/2021 Medical Record Number: [id number] Patient Account Number: [id number] Date of Birth/Sex: [birth year] (45 y.o. 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 Facility  Procedures CPT4  Code: [id number] Description: [id number] - DEB SUBQ TISSUE 20 SQ CM/< Modifier: Quantity: 1 CPT4 Code: Description: ICD-10 Diagnosis Description 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 Modifier: severity Quantity: Physician Procedures CPT4 Code: [id number] Description: [id number] - WC PHYS SUBQ TISS 20 SQ CM Modifier: Quantity: 1 CPT4 Code: Description: ICD-10 Diagnosis Description 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 Modifier: severity Quantity: Electronic Signature(s) Signed: 05/26/2021 3:31:08 PM By: Kalman Shan DO Entered By: Kalman Shan on 05/26/2021 15:30:45

## 2021-05-26 NOTE — Progress Notes (Signed)
ERVINA, IRIBE ([id number]) Visit Report for 05/26/2021 Arrival Information Details Patient Name: [name], [name] Date of Service: 05/26/2021 2:00 PM Medical Record Number: [id number] Patient Account Number: [id number] Date of Birth/Sex: 05/22/1976 (45 y.o. F) Treating RN: Dolan Amen Primary Care Deldrick Linch: Raelene Bott Other Clinician: Referring Lucindia Lemley: Raelene Bott Treating Kazuo Durnil/Extender: Yaakov Guthrie in Treatment: 9 Visit Information History Since Last Visit Pain Present Now: Yes Patient Arrived: Wheel Chair Arrival Time: 14:40 Accompanied By: mother Transfer Assistance: Manual Patient Identification Verified: Yes Secondary Verification Process Completed: Yes Patient Requires Transmission-Based No Precautions: Patient Has Alerts: Yes Patient Alerts: Patient on Blood Thinner '81mg'$  apirin Diabetic Type II Electronic Signature(s) Signed: 05/26/2021 4:40:35 PM By: Dolan Amen RN Entered By: Dolan Amen on 05/26/2021 14:41:17 Dunnigan, Doloras A. ([id number]) -------------------------------------------------------------------------------- Clinic Level of Care Assessment Details Patient Name: Heather Dear A. Date of Service: 05/26/2021 2:00 PM Medical Record Number: [id number] Patient Account Number: [id number] Date of Birth/Sex: September 01, 1976 (45 y.o. F) Treating RN: Dolan Amen Primary Care Eviana Sibilia: Raelene Bott Other Clinician: Referring Floride Hutmacher: Raelene Bott Treating Monetta Lick/Extender: Yaakov Guthrie in Treatment: 9 Clinic Level of Care Assessment Items TOOL 1 Quantity Score '[]'$  - Use when EandM and Procedure is performed on INITIAL visit 0 ASSESSMENTS - Nursing Assessment / Reassessment '[]'$  - General Physical Exam (combine w/ comprehensive assessment (listed just below) when performed on new 0 pt. evals) '[]'$  - 0 Comprehensive Assessment (HX, ROS, Risk Assessments, Wounds Hx, etc.) ASSESSMENTS - Wound and Skin Assessment /  Reassessment '[]'$  - Dermatologic / Skin Assessment (not related to wound area) 0 ASSESSMENTS - Ostomy and/or Continence Assessment and Care '[]'$  - Incontinence Assessment and Management 0 '[]'$  - 0 Ostomy Care Assessment and Management (repouching, etc.) PROCESS - Coordination of Care '[]'$  - Simple Patient / Family Education for ongoing care 0 '[]'$  - 0 Complex (extensive) Patient / Family Education for ongoing care '[]'$  - 0 Staff obtains Programmer, systems, Records, Test Results / Process Orders '[]'$  - 0 Staff telephones HHA, Nursing Homes / Clarify orders / etc '[]'$  - 0 Routine Transfer to another Facility (non-emergent condition) '[]'$  - 0 Routine Hospital Admission (non-emergent condition) '[]'$  - 0 New Admissions / Biomedical engineer / Ordering NPWT, Apligraf, etc. '[]'$  - 0 Emergency Hospital Admission (emergent condition) PROCESS - Special Needs '[]'$  - Pediatric / Minor Patient Management 0 '[]'$  - 0 Isolation Patient Management '[]'$  - 0 Hearing / Language / Visual special needs '[]'$  - 0 Assessment of Community assistance (transportation, D/C planning, etc.) '[]'$  - 0 Additional assistance / Altered mentation '[]'$  - 0 Support Surface(s) Assessment (bed, cushion, seat, etc.) INTERVENTIONS - Miscellaneous '[]'$  - External ear exam 0 '[]'$  - 0 Patient Transfer (multiple staff / Civil Service fast streamer / Similar devices) '[]'$  - 0 Simple Staple / Suture removal (25 or less) '[]'$  - 0 Complex Staple / Suture removal (26 or more) '[]'$  - 0 Hypo/Hyperglycemic Management (do not check if billed separately) '[]'$  - 0 Ankle / Brachial Index (ABI) - do not check if billed separately Has the patient been seen at the hospital within the last three years: Yes Total Score: 0 Level Of Care: ____ [name] ([id number]) Electronic Signature(s) Signed: 05/26/2021 4:40:35 PM By: Dolan Amen RN Entered By: Dolan Amen on 05/26/2021 15:02:17 [name]  ([id number]) -------------------------------------------------------------------------------- Encounter Discharge Information Details Patient Name: Heather Dear A. Date of Service: 05/26/2021 2:00 PM Medical Record Number: [id number] Patient Account Number: [id number] Date of Birth/Sex: [birth year] (45 y.o. F) Treating RN: Dolan Amen  Primary Care Margart Zemanek: Raelene Bott Other Clinician: Referring Lynnett Langlinais: Raelene Bott Treating Elliette Seabolt/Extender: Yaakov Guthrie in Treatment: 9 Encounter Discharge Information Items Post Procedure Vitals Discharge Condition: Stable Temperature (F): 98.9 Ambulatory Status: Wheelchair Pulse (bpm): 88 Discharge Destination: Home Respiratory Rate (breaths/min): 18 Transportation: Private Auto Blood Pressure (mmHg): 107/80 Accompanied By: mother Schedule Follow-up Appointment: Yes Clinical Summary of Care: Electronic Signature(s) Signed: 05/26/2021 4:39:10 PM By: Dolan Amen RN Entered By: Dolan Amen on 05/26/2021 16:39:10 [name] ([id number]) -------------------------------------------------------------------------------- Lower Extremity Assessment Details Patient Name: Heather Dear A. Date of Service: 05/26/2021 2:00 PM Medical Record Number: [id number] Patient Account Number: [id number] Date of Birth/Sex: [birth year] (45 y.o. F) Treating RN: Dolan Amen Primary Care Cauy Melody: Raelene Bott Other Clinician: Referring Aamira Bischoff: Raelene Bott Treating Kaydance Bowie/Extender: Yaakov Guthrie in Treatment: 9 Edema Assessment Assessed: [Left: Yes] [Right: No] Edema: [Left: N] [Right: o] Vascular Assessment Pulses: Dorsalis Pedis Palpable: [Left:Yes] Electronic Signature(s) Signed: 05/26/2021 4:40:35 PM By: Dolan Amen RN Entered By: Dolan Amen on 05/26/2021 14:50:49 Barkalow, Kaylaann A. ([id number]) -------------------------------------------------------------------------------- Multi Wound Chart  Details Patient Name: Heather Dear A. Date of Service: 05/26/2021 2:00 PM Medical Record Number: [id number] Patient Account Number: [id number] Date of Birth/Sex: 1976/08/21 (45 y.o. F) Treating RN: Dolan Amen Primary Care Letisha Yera: Raelene Bott Other Clinician: Referring Imelda Dandridge: Raelene Bott Treating Daizha Anand/Extender: Yaakov Guthrie in Treatment: 9 Vital Signs Height(in): 44 Pulse(bpm): 60 Weight(lbs): 185 Blood Pressure(mmHg): 107/80 Body Mass Index(BMI): 27 Temperature(F): 98.9 Respiratory Rate(breaths/min): 18 Photos: [N/A:N/A] Wound Location: Left, Midline, Plantar Foot N/A N/A Wounding Event: Gradually Appeared N/A N/A Primary Etiology: Pressure Ulcer N/A N/A Comorbid History: Chronic sinus problems/congestion, N/A N/A Middle ear problems, Anemia, Chronic Obstructive Pulmonary Disease (COPD), Congestive Heart Failure, Type II Diabetes, End Stage Renal Disease, History of pressure wounds, Neuropathy Date Acquired: 08/07/2020 N/A N/A Weeks of Treatment: 9 N/A N/A Wound Status: Open N/A N/A Measurements L x W x D (cm) 2x2.2x1.1 N/A N/A Area (cm) : 3.456 N/A N/A Volume (cm) : 3.801 N/A N/A % Reduction in Area: -29.40% N/A N/A % Reduction in Volume: -184.70% N/A N/A Classification: Category/Stage II N/A N/A Exudate Amount: Large N/A N/A Exudate Type: Serosanguineous N/A N/A Exudate Color: red, brown N/A N/A Wound Margin: Thickened N/A N/A Granulation Amount: Medium (34-66%) N/A N/A Granulation Quality: Pink N/A N/A Necrotic Amount: Medium (34-66%) N/A N/A Exposed Structures: Fat Layer (Subcutaneous Tissue): N/A N/A Yes Fascia: No Tendon: No Muscle: No Joint: No Bone: No Epithelialization: None N/A N/A Debridement: Debridement - Excisional N/A N/A Pre-procedure Verification/Time 15:00 N/A N/A Out Taken: Tissue Debrided: Callus, Subcutaneous N/A N/A Level: Skin/Subcutaneous Tissue N/A N/A Debridement Area (sq cm): 4 N/A N/A Instrument:  Curette N/A N/A Bleeding: Minimum N/A N/A Hemostasis Achieved: Pressure N/A N/A Pain, Allissa A. ([id number]) Debridement Treatment Procedure was tolerated well N/A N/A Response: Post Debridement 2x3.3x1.1 N/A N/A Measurements L x W x D (cm) Post Debridement Volume: 5.702 N/A N/A (cm) Post Debridement Stage: Category/Stage II N/A N/A Procedures Performed: Debridement N/A N/A Treatment Notes Electronic Signature(s) Signed: 05/26/2021 3:31:08 PM By: Kalman Shan DO Entered By: Kalman Shan on 05/26/2021 15:07:08 Iseman, Edmund A. ([id number]) -------------------------------------------------------------------------------- Pain Assessment Details Patient Name: Heather Dear A. Date of Service: 05/26/2021 2:00 PM Medical Record Number: [id number] Patient Account Number: [id number] Date of Birth/Sex: 12/28/1975 (45 y.o. F) Treating RN: Dolan Amen Primary Care Brody Bonneau: Raelene Bott Other Clinician: Referring Tyannah Sane: Raelene Bott Treating Asad Keeven/Extender: Yaakov Guthrie in Treatment: 9 Active Problems Location of Pain Severity and  Description of Pain Patient Has Paino Yes Site Locations Rate the pain. Current Pain Level: 9 Pain Management and Medication Current Pain Management: Electronic Signature(s) Signed: 05/26/2021 4:40:35 PM By: Dolan Amen RN Entered By: Dolan Amen on 05/26/2021 14:44:14 [name] ([id number]) -------------------------------------------------------------------------------- Patient/Caregiver Education Details Patient Name: Heather Dear A. Date of Service: 05/26/2021 2:00 PM Medical Record Number: [id number] Patient Account Number: [id number] Date of Birth/Gender: January 19, 1976 (45 y.o. F) Treating RN: Dolan Amen Primary Care Physician: Raelene Bott Other Clinician: Referring Physician: Raelene Bott Treating Physician/Extender: Yaakov Guthrie in Treatment: 9 Education Assessment Education Provided  To: Patient Education Topics Provided Wound/Skin Impairment: Methods: Explain/Verbal Responses: State content correctly Electronic Signature(s) Signed: 05/26/2021 4:40:35 PM By: Dolan Amen RN Entered By: Dolan Amen on 05/26/2021 15:02:49 [name] ([id number]) -------------------------------------------------------------------------------- Wound Assessment Details Patient Name: Heather Dear A. Date of Service: 05/26/2021 2:00 PM Medical Record Number: [id number] Patient Account Number: [id number] Date of Birth/Sex: [birth year] (45 y.o. F) Treating RN: Dolan Amen Primary Care Leveon Pelzer: Raelene Bott Other Clinician: Referring Ellington Greenslade: Raelene Bott Treating Alsha Meland/Extender: Yaakov Guthrie in Treatment: 9 Wound Status Wound Number: 7 Primary Pressure Ulcer Etiology: Wound Location: Left, Midline, Plantar Foot Wound Open Wounding Event: Gradually Appeared Status: Date Acquired: 08/07/2020 Comorbid Chronic sinus problems/congestion, Middle ear problems, Weeks Of Treatment: 9 History: Anemia, Chronic Obstructive Pulmonary Disease (COPD), Clustered Wound: No Congestive Heart Failure, Type II Diabetes, End Stage Renal Disease, History of pressure wounds, Neuropathy Photos Wound Measurements Length: (cm) 2 Width: (cm) 2.2 Depth: (cm) 1.1 Area: (cm) 3.456 Volume: (cm) 3.801 % Reduction in Area: -29.4% % Reduction in Volume: -184.7% Epithelialization: None Tunneling: No Undermining: No Wound Description Classification: Category/Stage II Wound Margin: Thickened Exudate Amount: Large Exudate Type: Serosanguineous Exudate Color: red, brown Foul Odor After Cleansing: No Slough/Fibrino Yes Wound Bed Granulation Amount: Medium (34-66%) Exposed Structure Granulation Quality: Pink Fascia Exposed: No Necrotic Amount: Medium (34-66%) Fat Layer (Subcutaneous Tissue) Exposed: Yes Necrotic Quality: Adherent Slough Tendon Exposed: No Muscle  Exposed: No Joint Exposed: No Bone Exposed: No Treatment Notes Wound #7 (Foot) Wound Laterality: Plantar, Left, Midline Cleanser Soap and Water Discharge Instruction: Gently cleanse wound with antibacterial soap, rinse and pat dry prior to dressing wounds Hochstein, Medora A. ([id number]) Peri-Wound Care Desitin Maximum Strength Ointment 4 (oz) Discharge Instruction: peri wound Topical Gentamicin Discharge Instruction: Apply to wound bed and tunnel area under the dressing Primary Dressing Silvercel Small 2x2 (in/in) Discharge Instruction: Apply to wound bed Secondary Dressing ABD Pad 5x9 (in/in) Discharge Instruction: Cover with ABD pad on top of dressing Gauze Discharge Instruction: To hold dressing in place Secured With Kerlix Roll Sterile or Non-Sterile 6-ply 4.5x4 (yd/yd) Discharge Instruction: Apply Kerlix as directed Compression Wrap Compression Stockings Add-Ons Electronic Signature(s) Signed: 05/26/2021 4:40:35 PM By: Dolan Amen RN Entered By: Dolan Amen on 05/26/2021 14:49:39 Kirsch, Anysha A. ([id number]) -------------------------------------------------------------------------------- Vitals Details Patient Name: Heather Dear A. Date of Service: 05/26/2021 2:00 PM Medical Record Number: [id number] Patient Account Number: [id number] Date of Birth/Sex: 04-01-1976 (45 y.o. F) Treating RN: Dolan Amen Primary Care Sharonica Kraszewski: Raelene Bott Other Clinician: Referring Tamecca Artiga: Raelene Bott Treating Haleema Vanderheyden/Extender: Yaakov Guthrie in Treatment: 9 Vital Signs Time Taken: 14:41 Temperature (F): 98.9 Height (in): 69 Pulse (bpm): 88 Weight (lbs): 185 Respiratory Rate (breaths/min): 18 Body Mass Index (BMI): 27.3 Blood Pressure (mmHg): 107/80 Reference Range: 80 - 120 mg / dl Electronic Signature(s) Signed: 05/26/2021 4:40:35 PM By: Dolan Amen RN Entered By: Dolan Amen  on 05/26/2021 14:43:56

## 2021-05-27 ENCOUNTER — Other Ambulatory Visit: Payer: Self-pay | Admitting: Internal Medicine

## 2021-05-27 DIAGNOSIS — S81802A Unspecified open wound, left lower leg, initial encounter: Secondary | ICD-10-CM

## 2021-06-09 ENCOUNTER — Other Ambulatory Visit: Payer: Self-pay

## 2021-06-09 ENCOUNTER — Encounter (HOSPITAL_BASED_OUTPATIENT_CLINIC_OR_DEPARTMENT_OTHER): Payer: Medicaid Other | Admitting: Internal Medicine

## 2021-06-09 DIAGNOSIS — L97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ity: Secondary | ICD-10-CM

## 2021-06-09 DIAGNOSIS — E11621 Type 2 diabetes mellitus with foot ulcer: Secondary | ICD-10-CM | POA: Diagnosis not present

## 2021-06-09 NOTE — Progress Notes (Signed)
BLUMA, TOBE ([id number]) Visit Report for 06/09/2021 Chief Complaint Document Details Patient Name: [name], [name] Date of Service: 06/09/2021 10:45 AM Medical Record Number: [id number] Patient Account Number: [id number] Date of Birth/Sex: 08-Sep-1976 (45 y.o. F) Treating RN: Dolan Amen Primary Care Provider: Raelene Bott Other Clinician: Referring Provider: Raelene Bott Treating Provider/Extender: Yaakov Guthrie in Treatment: 11 Information Obtained from: Patient Chief Complaint Left great toe ulcer 03/19/2021; patient referred by Dr. Luana Shu who has been looking after her left foot for quite a period of time for review of a nonhealing area in the left midfoot Electronic Signature(s) Signed: 06/09/2021 11:45:37 AM By: Kalman Shan DO Entered By: Kalman Shan on 06/09/2021 11:40:54 Heigl, Ambreen A. ([id number]) -------------------------------------------------------------------------------- Debridement Details Patient Name: Heather Dear A. Date of Service: 06/09/2021 10:45 AM Medical Record Number: [id number] Patient Account Number: [id number] Date of Birth/Sex: 1976-05-21 (45 y.o. F) Treating RN: Dolan Amen Primary Care Provider: Raelene Bott Other Clinician: Referring Provider: Raelene Bott Treating Provider/Extender: Yaakov Guthrie in Treatment: 11 Debridement Performed for Wound #7 Left,Midline,Plantar Foot Assessment: Performed By: Physician Kalman Shan, MD Debridement Type: Debridement Level of Consciousness (Pre- Awake and Alert procedure): Pre-procedure Verification/Time Out Yes - 11:31 Taken: Start Time: 11:31 Total Area Debrided (L x W): 2 (cm) x 3 (cm) = 6 (cm) Tissue and other material Viable, Non-Viable, Callus, Slough, Subcutaneous, Slough debrided: Level: Skin/Subcutaneous Tissue Debridement Description: Excisional Instrument: Curette Bleeding: Minimum Hemostasis Achieved: Pressure Response to Treatment:  Procedure was tolerated well Level of Consciousness (Post- Awake and Alert procedure): Post Debridement Measurements of Total Wound Length: (cm) 1.7 Stage: Category/Stage III Width: (cm) 3 Depth: (cm) 1 Volume: (cm) 4.006 Character of Wound/Ulcer Post Debridement: Stable Post Procedure Diagnosis Same as Pre-procedure Electronic Signature(s) Signed: 06/09/2021 11:45:37 AM By: Kalman Shan DO Signed: 06/09/2021 4:33:59 PM By: Dolan Amen RN Entered By: Dolan Amen on 06/09/2021 11:33:59 Herskowitz, Deboraha A. ([id number]) -------------------------------------------------------------------------------- HPI Details Patient Name: Heather Dear A. Date of Service: 06/09/2021 10:45 AM Medical Record Number: [id number] Patient Account Number: [id number] Date of Birth/Sex: 03/17/1976 (45 y.o. F) Treating RN: Dolan Amen Primary Care Provider: Raelene Bott Other Clinician: Referring Provider: Raelene Bott Treating Provider/Extender: Yaakov Guthrie in Treatment: 11 History of Present Illness HPI Description: 01/18/18-She is here for initial evaluation of the left great toe ulcer. She is a poor historian in regards to timeframe in detail. She states approximately 4 weeks ago she lacerated her toe on something in the house. She followed up with her primary care who placed her on Bactrim and ultimately a second dose of Bactrim prior to coming to wound clinic. She states she has been treating the toe with peroxide, Betadine and a Band-Aid. She did not check her blood sugar this morning but checked it yesterday morning it was 327; she is unaware of a recent A1c and there are no current records. She saw Dr. she would've orthopedics last week for an old injury to the left ankle, she states he did not see her toe, nor did she bring it to his attention. She smokes approximately 1 pack cigarettes a day. Her social situation is concerning, she arrives this morning with her mother who  appears extremely intoxicated/under the influence; her mother was asked to leave the room and be monitored by the patient's grandmother. The patient's aunt then accompanied the patient and the room throughout the rest of the appointment. We had a lengthy discussion regarding the deleterious effects of uncontrolled hyperglycemia and smoking as it relates to  wound healing and overall health. She was strongly encouraged to decrease her smoking and get her diabetes under better control. She states she is currently on a diet and has cut down her [facility] Rhonda W consumption. The left toe is erythematous, macerated and slightly edematous with malodor present. The edema in her left foot is below her baseline, there is no erythema streaking. We will treat her with Santyl, doxycycline; we have ordered and xray, culture and provided a Peg assist surgical shoe and cultured the wound. 01/25/18-She is here in follow-up evaluation for a left great toe ulcer and presents with an abscess to her suprapubic area. She states her blood sugars remain elevated, feeling "sick" and if levels are below 250, but she is trying. She has made no attempt to decrease her smoking stating that we "can't take away her food in her cigarettes". She has been compliant with offloading using the PEG assist you. She is using Santyl daily. the culture obtained last week grew staph aureus and Enterococcus faecalis; continues on the doxycycline and Augmentin was added on Monday. The suprapubic area has erythema, no femoral variation, purple discoloration, minimal induration, was accessed with a cotton tip applicator with sanguinopurulent drainage, this was cultured, I suspect the current antibiotic treatment will cover and we will not add anything to her current treatment plan. She was advised to go to urgent care or ER with any change in redness, induration or fever. 02/01/18-She is here in follow-up evaluation for left great toe ulcers and a new  abdominal abscess from last week. She was able to use packing until earlier this week, where she "forgot it was there". She states she was feeling ill with GI symptoms last week and was not taking her antibiotic. She states her glucose levels have been predominantly less than 200, with occasional levels between 200-250. She thinks this was contributing to her GI symptoms as they have resolved without intervention. There continues to be significant laceration to left toe, otherwise it clinically looks stable/improved. There is now less superficial opening to the lateral aspect of the great toe that was residual blister. We will transition to [facility] to all wounds, she will continue her Augmentin. If there is no change or deterioration next week for reculture. 02/08/18-She is here in follow-up evaluation for left great toe ulcer and abdominal ulcer. There is an improvement in both wounds. She has been wrapping her left toe with coban, not by our direction, which has created an area of discoloration to the medial aspect; she has been advised to NOT use coban secondary to her neuropathy. She states her glucose levels have been high over this last week ranging from 200-350, she continues to smoke. She admits to being less compliant with her offloading shoe. We will continue with same treatment plan and she will follow-up next week. 02/15/18-She is here in follow-up evaluation for left great toe ulcer and abdominal ulcer. The abdominal ulcer is epithelialized. The left great toe ulcer is improved and all injury from last week using the Coban wrap is resolved, the lateral ulcer is healed. She admits to noncompliance with wearing offloading shoe and admits to glucose levels being greater than 300 most of the week. She continues to smoke and expresses no desire to quit. There is one area medially that probes deeper than it has historically, erythema to the toe and dorsal foot has consistently waxed  and waned. There is no overt signs of cellulitis or infection but we will culture the wound  for any occult infection given the new area of depth and erythema. We will hold off on sensitivities for initiation of antibiotic therapy. 02/22/18-She is here in follow up evaluation for left great toe ulcer. There is overall significant improvement in both wound appearance, erythema and edema with changes made last week. She was not initiated on antibiotic therapy. Culture obtained last week showed oxacillin sensitive staph aureus, sensitive to clindamycin. Clindamycin has been called into the pharmacy but she has been instructed to hold off on initiation secondary to overall clinical improvement and her history of antibiotic intolerance. She has been instructed to contact the clinic with any noted changes/deterioration and the wound, erythema, edema and/or pain. She will follow-up next week. She continues to smoke and her glucose levels remain elevated >250; she admits to compliance with offloading shoe 03/01/18 on evaluation today patient appears to be doing fairly well in regard to her left first toe ulcer. She has been tolerating the dressing changes with the [facility] Dressing without complication and overall this has definitely showed signs of improvement according to records as well is what the patient tells me today. I'm very pleased in that regard. She is having no pain today 03/08/18 She is here for follow up evaluation of a left great toe ulcer. She remains non-compliant with glucose control and smoking cessation; glucose levels consistently >200. She states that she got new shoe inserts/peg assist. She admits to compliance with offloading. Since my last evaluation there is significant improvement. We will switch to prisma at this time and she will follow up next week. She is noted to be tachycardic at this appointment, heart rate 120s; she has a history of heart rate 70-130 according to our  records. She admits to extreme agitation r/t personal issues; she was advised to monitor her heartrate and contact her physician if it does not return to a more normal range (<100). She takes cardizem twice daily. 03/15/18-She is here in follow-up evaluation for left great toe ulcer. She remains noncompliant with glucose control and smoking cessation. She admits to compliance with wearing offloading shoe. The ulcer is improved/stable and we will continue with the same treatment plan and she will follow-up next week 03/22/18-She is here for evaluation for left great toe ulcer. There continues to be significant improvement despite recurrent hyperglycemia (over 500 yesterday) and she continues to smoke. She has been compliant with offloading and we will continue with same treatment plan and she will follow-up next week. 03/29/18-She is here for evaluation for left great toe ulcer. Despite continuing to smoke and uncontrolled diabetes she continues to improve. She is compliant with offloading shoe. We will continue with the same treatment plan and she will follow-up next week 04/05/18- She is here in follow up evaluation for a left great toe ulcer; she presents with small pustule to left fifth toe (resembles ant bite). She admits to compliance with wearing offloading shoe; continues to smoke or have uncontrolled blood glucose control. There is more callus than usual with evidence of bleeding; she denies known trauma. 04/12/18-She is here for evaluation of left great toe ulcer. Despite noncompliance with glycemic control and smoking she continues to make Haith, Pamla A. ([id number]) improvement. She continues to wear offloading shoe. The pustule, that was identified last week, to the left fifth toe is resolved. She will follow-up in 2 weeks 05/03/18-she is seen in follow-up evaluation for a left great toe ulcer. She is compliant with offloading, otherwise noncompliant with glycemic control and smoking.  She  has plateaued and there is minimal improvement noted. We will transition to [facility], replaced the insert to her surgical shoe and she will follow-up in one week 05/10/18- She is here in follow up evaluation for a left great toe ulcer. It appears stable despite measurement change. We will continue with same treatment plan and follow up next week. 05/24/18-She is seen in follow-up evaluation for a left great toe ulcer. She remains compliant with offloading, has made significant improvement in her diet, decreasing the amount of sugar/soda. She said her recent A1c was 10.9 which is lower than. She did see a diabetic nutritionist/educator yesterday. She continues to smoke. We will continue with the same treatment plan and she'll follow-up next week. 05/31/18- She is seen in follow-up evaluation for left great toe ulcer. She continues to remain compliant with offloading, continues to make improvement in her diet, increasing her water and decreasing the amount of sugar/soda. She does continue to smoke with no desire to quit. We will apply Prisma to the depth and Hydrofera Blue over. We have not received insurance authorization for oasis. She will follow up next week. 06/07/18-She is seen in follow-up evaluation for left great toe ulcer. It has stalled according to today's measurements although base appears stable. She says she saw a diabetic educator yesterday; her average blood sugars are less than 300 which is an improvement for her. She continues to smoke and states "that's my next step" She continues with water over soda. We will order for xray, culture and reinstate ace wrap compression prior to placing apligraf for next week. She is voicing no complaints or concerns. Her dressing will change to iodoflex over the next week in preparation for apligraf. 06/14/18-She is seen in follow-up evaluation for left great toe ulcer. Plain film x-ray performed last week was negative for osteomyelitis. Wound culture  obtained last week grew strep B and OSSA; she is initiated on keflex and cefdinir today; there is erythema to the toe which could be from ace wrap compression, she has a history of wrapping too tight and has has been encouraged to maintain ace wraps that we place today. We will hold off on application of apligraf today, will apply next week after antibiotic therapy has been initiated. She admits today that she has resumed taking a shower with her foot/toe submerged in water, she has been reminded to keep foot/toe out of the bath water. She will be seen in follow up next week 06/21/18-she is seen in follow-up evaluation for left great toe ulcer. She is tolerating antibiotic therapy with no GI disturbance. The wound is stable. Apligraf was applied today. She has been decreasing her smoking, only had 4 cigarettes yesterday and 1 today. She continues being more compliant in diabetic diet. She will follow-up next week for evaluation of site, if stable will remove at 2 weeks. 06/28/18- She is here in follow up evalution. Apligraf was placed last week, she states the dressing fell off on Tuesday and she was dressing with hydrofera blue. She is healed and will be discharged from the clinic today. She has been instructed to continue with smoking cessation, continue monitoring glucose levels, offloading for an additional 4 weeks and continue with hydrofera blue for additional two weeks for any possible microscopic opening. Readmission: 08/07/18 on evaluation today patient presents for reevaluation concerning the ulcer of her right great toe. She was previously discharged on 06/28/18 healed. Nonetheless she states that this began to show signs of drainage she subsequently  went to her primary care provider. Subsequently an x-ray was performed on 08/01/18 which was negative. The patient was also placed on antibiotics at that time. Fortunately they should have been effective for the infection. Nonetheless she's been  experiencing some improvement but still has a lot of drainage coming from the wound itself. 08/14/18 on evaluation today patient's wound actually does show signs of improvement in regard to the erythema at this point. She has completed the antibiotics. With that being said we did discuss the possibility of placing her in a total contact cast as of today although I think that I may want to give this just a little bit more time to ensure nothing recurrence as far as her infection is concerned. I do not want to put in the cast and risk infection at that time if things are not completely resolved. With that being said she is gonna require some debridement today. 08/21/18 on evaluation today patient actually appears to be doing okay in regard to her toe ulcer. She's been tolerating the dressing changes without complication. With that being said it does appear that she is ready and in fact I think it's appropriate for [place] to go ahead and initiate the total contact cast today. Nonetheless she will require some sharp debridement to prepare the wound for application. Overall I feel like things have been progressing well but we do need to do something to get this to close more readily. 08/24/18 patient seen today for reevaluation after having had the total contact cast applied on Tuesday. She seems to have done very well the wound appears to be doing great and overall I'm pleased with the progress that she's made. There were no abnormal areas of rubbing from the cast on her lower extremity. 08/30/18 on evaluation today patient actually appears to be completely healed in regard to her plantar toe ulcer. She tells me at this point she's been having a lot of issues with the cast. She almost fell a couple of times the state shall the step of her dog a couple times as well. This is been a very frustrating process for her other nonetheless she has completely healed the wound which is excellent news. Overall there does  not appear to be the evidence of infection at this time which is great news. 09/11/18 evaluation today patient presents for follow-up concerning her great toe ulcer on the left which has unfortunately reopened since I last saw her which was only a couple of weeks ago. Unfortunately she was not able to get in to get the shoe and potentially the AFO that's gonna be necessary due to her left foot drop. She continues with offloading shoe but this is not enough to prevent her from reopening it appears. When we last had her in the total contact cast she did well from a healing standpoint but unfortunately the wound reopened as soon as she came out of the cast within just a couple of weeks. 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 This is also part of the reason why she falls on a regular basis. I just do not believe that is gonna be safe for [place] to reinitiate the total contact cast as last time we had this on she fell 3 times one week  which is definitely not normal for her. 09/18/18 upon evaluation today the patient actually appears to be doing about the same in regard to her toe ulcer. She did not contact [employer] as I asked her to even though I had given her the prescription. In fact she actually states that she has no idea where the prescription is. She did apparently call [employer] and they told her that all she needed to do was bring the prescription in order to be able to be seen and work on getting the AFO for her left foot. With all that being said she still does not have an appointment and I'm not sure were things stand that regard. I will give her a new prescription today in order to contact them to get this set up. 09/25/18 on evaluation today patient actually appears to be doing about the same in regard  to her toes ulcer. She does have a small areas which seems to have a lot of callous buildup around the edge of the wound which is going to need sharp debridement today. She still is waiting to be scheduled for evaluation with [employer] for possibility of an AFO. She states there supposed to call her tomorrow to get this set up. Unfortunately it does appear that her foot specifically the toe area is showing signs of erythema. There does not appear to be any systemic infection which is in these good news. ALTAMAE, LIPKA A. ([id number]) 10/02/18 on evaluation today patient actually appears to be doing about the same in regard to her toe ulcer. This really has not done too well although it's not significantly larger it's also not significantly smaller. She has been tolerating the dressing changes without complication. She actually has her appointment with [employer] and [place] tomorrow to hopefully be measured for obtaining and AFO splint. I think this would be helpful preventing this from reoccurring. We had contemplated starting the cast this week although to be honest I am reluctant to do that as she's been having nausea, vomiting, and seizure activity over the past three days. She has a history of seizures and have been told is nothing that can be done for these. With that being said I do believe that along with the seizures have the nausea vomiting which upon further questioning doesn't seem to be the normal for her and makes me concerned for the possibility of infection or something else going on. I discussed this with the patient and her mother during the office visit today. I do not feel the wound is effective but maybe something else. The responses this was "this just happens to her at times and we don't know why". They did not seem to be interested in going to the hospital to have this checked out further. 10/09/18 on evaluation today patient presents for follow-up concerning her ongoing toe ulcer.  She has been tolerating the dressing changes without complication. Fortunately there does not appear to be any evidence of infection which is great news however I do think that the patient would benefit from going ahead for with the total contact cast. She's actually in a wheelchair today she tells me that she will use her walker if we initiate the cast. I was very specific about the fact that if we were gonna do the cast I wanted to make sure that she was using the walker in order to prevent any falls. She tells me she does not have stairs that she has to traverse on a regular basis at  her home. She has not had any seizures since last week again that something that happens to her often she tells me she did talk to [employer] and they said that it may take up to three weeks to get the brace approved for her. Hopefully that will not take that long but nonetheless in the meantime I do think the cast could be of benefit. 10/12/18 on evaluation today patient appears to be doing rather well in regard to her toe ulcer. It's just been a few days and already this is significantly improved both as far as overall appearance and size. Fortunately there's no sign of infection. She is here for her first obligatory cast change. 10/19/18 Seen today for follow up and management of left great toe ulcer. Wound continues to show improvement. Noted small open area with seroussang drainage with palpation. Denies any increased pain or recent fevers during visit. She will continue calcium alginate with offloading shoe. Denies any questions or concerns during visit. 10/26/18 on evaluation today patient appears to be doing about the same as when I last saw her in regard to her wound bed. Fortunately there does not appear to be any signs of infection. Unfortunately she continues to have a breakdown in regard to the toe region any time that she is not in the cast. It takes almost no time at all for this to happen. Nonetheless she  still has not heard anything from the brace being made by [employer] as to when exactly this will be available to her. Fortunately there is no signs of infection at this time. 10/30/18 on evaluation today patient presents for application of the total contact cast as we just received him this morning. Fortunately we are gonna be able to apply this to her today which is great news. She continues to have no significant pain which is good news. Overall I do feel like things have been improving while she was the cast is when she doesn't have a cast that things get worse. She still has not really heard anything from [place] regarding her brace. 11/02/18 upon evaluation today patient's wound already appears to be doing significantly better which is good news. Fortunately there does not appear to be any signs of infection also good news. Overall I do think the total contact cast as before is helping to heal this area unfortunately it's just not gonna likely keep the area closed and healed without her getting her brace at least. Again the foot drop is a significant issue for her. 11/09/18 on evaluation today patient appears to be doing excellent in regard to her toe ulcer which in fact is completely healed. Fortunately we finally got the situation squared away with the paperwork which was needed to proceed with getting her brace approved by Medicaid. I have filled that out unfortunately that information has been sent to the orthopedic office that I worked at 2 1/2 years ago and not tired Current wound care measures. Fortunately she seems to be doing very well at this time. 11/23/18 on evaluation today patient appears to be doing More Poorly Compared to Last Time I Saw Her. At [facility] She Had Completely Healed. Currently she is continuing to have issues with reopening. She states that she just found out that the brace was approved through Medicaid now she just has to go get measured in order to have this fitted for  her and then made. Subsequently she does not have an appointment for this yet that is going to  complicate things we obviously cannot put her back in the cast if we do not have everything measured because they're not gonna be able to measure her foot while she is in the cast. 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 Obviously this is unfortunate and does have me somewhat worried as well. 11/30/18 on evaluation today patient's toe ulcer actually appears to be doing fairly well. The good news is she will be getting her brace in the shoes next week on Wednesday. Hopefully we will be able to get this to heal without having to go back in the cast however she may need the cast in order to get the wound completely heal and then go from there. Fortunately there's no signs of infection at this time. 12/07/18 on evaluation today patient fortunately did receive her brace and she states she could tell this definitely makes her walk better. With that being said she's been having issues with her toe where she noticed yesterday there was a lot of tissue that was loosing off this appears to be much larger than what it was previous. She also states that her leg has been read putting much across the top of her foot just about the ankle although this seems to be receiving somewhat. The total area is still red and appears to be someone infected as best I can tell. She is previously taken Bactrim and that may be a good option for her today as well. We are gonna see what I wound culture shows as well and I think that this is definitely appropriate. With that being said outside of the culture I still need to initiate something in the interim and that's what I'm gonna go ahead and select Bactrim is a good option for her. 12/14/18 on evaluation today patient appears to be doing better in regard to her left great toe ulcer as  compared to last week's evaluation. There's still some erythema although this is significantly improved which is excellent news. Overall I do believe that she is making good progress is still gonna take some time before she is where I would like her to be from the standpoint of being able to place her back into the total contact cast. Hopefully we will be where we need to be by next week. 12/21/18 on evaluation today patient actually appears to be doing poorly in regard to her toe ulcer. She's been tolerating the dressing changes without complication. Fortunately there's no signs of systemic infection although she does have a lot of drainage from the toe ulcer and this does seem to be causing some issues at this point. She does have erythema on the distal portion of her toe that appears to be likely cellulitis. 12/28/18 on evaluation today patient actually appears to be doing a little better in my pinion in regard to her toe ulcer. With that being said she still does have some evidence of infection at this time and for her culture she had both E. coli as well as enterococcus as organisms noted on evaluation. For that reason I think that though the Keflex likely has treated the E. coli rather well this has really done nothing for the enterococcus. We are going to have to initiate treatment for this specifically. RASHELE, NIBARGER A. ([id number]) 01/04/19 on evaluation today patient's toe actually appears to be doing better from the standpoint of infection. She currently  would like to see about putting the cash back on I think that this is appropriate as long as she takes care of it and keeps it from getting wet. She is gonna have some drainage we can definitely pass this up with Drawtex and alginate to try to prevent as much drainage as possible from causing the problems. With that being said I do want to at least try her with the cast between now and Tuesday. If there any issues we can't continue to use  it then I will discontinue the use of the cast at that point. 01/08/19 on evaluation today patient actually appears to be doing very well as far as her foot ulcer specifically the great toe on the left is concerned. She did have an area of rubbing on the medial aspect of her left ankle which again is from the cast. Fortunately there's no signs of infection at this point in this appears to be a very slight skin breakdown. The patient tells me she felt it rubbing but didn't think it was that bad. Fortunately there is no signs of active infection at this time which is good news. No fevers, chills, nausea, or vomiting noted at this time. 01/15/19 on evaluation today patient actually appears to be doing well in regard to her toe ulcer. Again as previous she seems to do well and she has the cast on which indicates to me that during the time she doesn't have a cast on she's putting way too much pressure on this region. Obviously I think that's gonna be an issue as with the current national emergency concerning the Covid-19 Virus it has been recommended that we discontinue the use of total contact casting by the chief medical officer of our company, Dr. Simona Huh. The reasoning is that if a patient becomes sick and cannot come into have the cast removed they could not just leave this on for an additional two weeks. Obviously the hospitals also do not want to receive patient's who are sick into the emergency department to potentially contaminate the region and spread the Covid-19 Virus among other sick individuals within the hospital system. Therefore at this point we are suspending the use of total contact cast until the current emergency subsides. This was all discussed with the patient today as well. 01/22/19 on evaluation today patient's wound on her left great toe appears to be doing slightly worse than previously noted last week. She tells me that she has been on this quite a bit in fact she tells me she's  been awake for 38 straight hours. This is due to the fact that she's having to care for grandparents because nobody else will. She has been taking care of them for five the last seven days since I've seen her they both have dementia his is from a stroke and her grandmother's was progressive. Nonetheless she states even her mom who knows her condition and situation has only help two of those days to take care of them she's been taking care of the rest. Fortunately there does not appear to be any signs of active infection in regard to her toe at this point although obviously it doesn't look as good as it did previous. I think this is directly related to her not taking off the pressure and friction by way of taking things easy. Though I completely understand what's going on. 01/29/19 on evaluation today patient's tools are actually appears to be showing some signs of improvement today compared to last  week's evaluation as far as not necessarily the overall size of the wound but the fact that she has some new skin growth in between the two ends of the wound opening. Overall I feel like she has done well she states that she had a family member give her what sounds to be a CAM walker boot which has been helpful as well. 02/05/19 on evaluation today patient's wound bed actually appears to be doing significantly better in regard to her overall appearance of the size of the wound. With that being said she is still having an issue with offloading efficiently enough to get this to close. Apparently there is some signs of infection at this point as well unfortunately. Previously she's done well of Augmentin I really do not see anything that needs to be culture currently but there theme and cellulitis of the foot that I'm seeing I'm gonna go ahead and place her on an antibiotic today to try to help clear this up. 02/12/2019 on evaluation today patient actually appears to be doing poorly in regard to her overall wound  status. She tells me she has been using her offloading shoe but actually comes in today wearing her tennis shoe with the AFO brace. Again as I previously discussed with her this is really not sufficient to allow the area to heal appropriately. Nonetheless she continues to be somewhat noncompliant and I do wonder based on what she has told my nurse in the past as to whether or not a good portion of this noncompliance may be recreational drug and alcohol related. She has had a history of heroin overdose and this was fairly recently in the past couple of months that have been seeing her. Nonetheless overall I feel like her wound looks significantly worse today compared to what it was previous. She still has significant erythema despite the Augmentin I am not sure that this is an appropriate medication for her infection I am also concerned that the infection may have gone down into her bone. 02/19/19 on evaluation today patient actually appears to be doing about the same in regard to her toe ulcer. Unfortunately she continues to show signs of bone exposure and infection at this point. There does not appear to be any evidence of worsening of the infection but I'm also not really sure that it's getting significantly better. She is on the Augmentin which should be sufficient for the Staphylococcus aureus infection that she has at this point. With that being said she may need IV antibiotics to more appropriately treat this. We did have a discussion today about hyperbaric option therapy. 02/28/19 on evaluation today patient actually appears to be doing much worse in regard to the wound on her left great toe as compared to even my previous evaluation last week. Unfortunately this seems to be training in a pretty poor direction. Her toe was actually now starting to angle laterally and I can actually see the entire joint area of the proximal portion of the digit where is the distal portion of the digit again is no  longer even in contact with the joint line. Unfortunately there's a lot more necrotic tissue around the edge and the toe appears to be showing signs of becoming gangrenous in my pinion. I'm very concerned about were things stand at this point. She did see infectious disease and they are planning to send in a prescription for Sivextro for her and apparently this has been approved. With that being said I don't think she  should avoid taking this but at the same time I'm not sure that it's gonna be sufficient to save her toe at this point. 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 03/05/19 on evaluation today patient actually appears to be doing quite well in regard to her toe all things considering. She still has bone exposed but there appears to be much less your thing on overall the appearance of the wound and the toe itself is dramatically improved. She still does have some issues currently obviously with infection she did see vascular as well and there concerned that her blood flow to the toad. For that reason they are setting up for an angiogram next week. 03/14/19 on evaluation today patient appears to be doing very poor in regard to her toe and specifically in regard to the ulceration and the fact that she's starting to notice the toe was leaning even more towards the lateral aspect and the complete joint is visible on the proximal aspect of the joint. Nonetheless she's also noted a significant odor and the tip of the toe is turning more dark and necrotic appearing. Overall I think she is getting worse not better as far as this is concerned. For that reason I am recommending at this point that she likely needs to be seen for likely amputation. READMISSION 03/19/2021 This is a patient that we  cared for in this clinic for a prolonged period of time in 2019 and 2020 with a left foot and left first toe wound. 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 ([id number]) 04/09/20 by Dr. Luana Shu. In December 2021 she had an ulcer on her right great toe as well as the fourth and fifth toes. 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 She also had an angiogram at that time and underwent angioplasty of the right anterior tibial artery. In any case she claims that the wound on the right foot is closed I did not look at this today which was probably an oversight although I think that should be done next week. After her surgery she developed a dehiscence but I do not see any follow-up of this. According to Dr. Deborra Medina last review that she was out of the area being cared for by another physician but recently came back to his attention. 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 A culture of this area showed E. coli apparently before she came back to see Dr. Luana Shu she was supposed to be receiving antibiotics but she did not really take them. Nor is she offloading this area at all. Finally her last hemoglobin A1c listed in epic was in March 2022 at 14.1 she says things are a lot better since then although I am not sure. 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 She had this wound at the time. During this admission she had E. coli sepsis a TEE was negative. Past medical history is extensive and includes type 2 diabetes with peripheral neuropathy cardiomyopathy with an ejection fraction of 33%, hypertension, hyperlipidemia chronic renal failure stage III history of substance  abuse with cocaine although she claims to be clean now verified by her mother. She is  still a heavy cigarette smoker. She has a history of bipolar disorder seizure disorder ABI in our clinic was 1.05 6/1; left midfoot in the setting of a TMA done previously. Round circular wound with a "knuckle" of protruding tissue. The problem is that the knuckle was not attached to any of the surrounding granulation and this probed proximally widely I removed a large portion of this tissue. This wound goes with considerable undermining laterally. I do not feel any bone there was no purulence but this is a deep wound. 6/8; in spite of the debridement I did last week. She arrives with a wound looking exactly the same. A protruding "knuckle" of tissue nonadherent to most of the surrounding tissue. There is considerable depth around this from 6-12 o'clock at 2.7 cm and undermining of 1 cm. This does not look overtly infected and the x-ray I did last week was negative for any osseous abnormalities. We have been using silver collagen 6/15; deep tissue culture I did last week showed moderate staph aureus and moderate Pseudomonas. This will definitely require prolonged antibiotic therapy. The pathology on the protuberant area was negative for malignancy fungus etc. 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 We have been using silver collagen. I am going to be prescribing Levaquin for 2 weeks. Her CT scan of the foot is down for 7/5 6/22; CT scan of the foot on 7 5. She says she has hardware in the left leg from her previous fracture. She is on the Levaquin for the deep tissue culture I did that showed methicillin sensitive staph aureus and Pseudomonas. I gave her a 2-week supply and she will have another week. She arrives in clinic today with the same protuberant tissue however this is nonadherent to the tissue surrounding it. I am really at a loss to explain this unless there is underlying deep tissue infection 6/29; patient presents for 1 week  follow-up. She has been using collagen to the wound bed. She reports taking her antibiotics as prescribed.She has no complaints or issues today. She denies signs of infection. 7/6; patient presents for one week followup. She has been using collagen to the wound bed. She states she is taking Levaquin however at times she is not able to keep it down. She denies signs of infection. 7/13; patient presents for 1 week follow-up. She has been using silver alginate to the wound bed. She still has nausea when taking her antibiotics. She denies signs of infection. 7/20; patient presents for 1 week follow-up. She has been using silver alginate with gentamicin cream to the wound bed. She denies any issues and has no complaints today. She denies signs of infection. 7/27; patient presents for 1 week follow-up. She continues to use silver alginate with gentamicin cream to the wound bed. She reports starting her antibiotics. She has no issues or complaints. Overall she reports stability to the wound. 8/3; patient presents for 1 week follow-up. She has been using silver alginate with gentamicin cream to the wound bed. She reports completing all antibiotics. She has no issues or complaints today. She denies signs of infection. Continue8/17; patient presents for 2-week follow-up. He is to use silver alginate to the wound bed. She has no issues or complaints today. She denies signs of infection. She reports her pain has improved in her foot since last clinic visit Electronic Signature(s) Signed:  06/09/2021 11:45:37 AM By: Kalman Shan DO Entered By: Kalman Shan on 06/09/2021 11:41:40 [name] ([id number]) -------------------------------------------------------------------------------- Physical Exam Details Patient Name: Heather Dear A. Date of Service: 06/09/2021 10:45 AM Medical Record Number: [id number] Patient Account Number: [id number] Date of Birth/Sex: 08/27/1976 (45 y.o. F) Treating RN:  Dolan Amen Primary Care Provider: Raelene Bott Other Clinician: Referring Provider: Raelene Bott Treating Provider/Extender: Yaakov Guthrie in Treatment: 11 Constitutional . Cardiovascular . Psychiatric . Notes Left foot (Transmetatarsal amputation): Wound to the plantar aspect that that has granulation tissue and nonviable tissue present. There is significant callus circumferentially. Electronic Signature(s) Signed: 06/09/2021 11:45:37 AM By: Kalman Shan DO Entered By: Kalman Shan on 06/09/2021 11:43:13 [name] ([id number]) -------------------------------------------------------------------------------- Physician Orders Details Patient Name: Heather Dear A. Date of Service: 06/09/2021 10:45 AM Medical Record Number: [id number] Patient Account Number: [id number] Date of Birth/Sex: Feb 09, 1976 (45 y.o. F) Treating RN: Dolan Amen Primary Care Provider: Raelene Bott Other Clinician: Referring Provider: Raelene Bott Treating Provider/Extender: Yaakov Guthrie in Treatment: 60 Verbal / Phone Orders: No Diagnosis Coding Follow-up Appointments o Return Appointment in 1 week. Bathing/ Shower/ Hygiene o Clean wound with Normal Saline or wound cleanser. Off-Loading Left Lower Extremity o Open toe surgical shoe with peg assist. o Turn and reposition every 2 hours Wound Treatment Wound #7 - Foot Wound Laterality: Plantar, Left, Midline Cleanser: Soap and Water 3 x Per Week/30 Days Discharge Instructions: Gently cleanse wound with antibacterial soap, rinse and pat dry prior to dressing wounds Peri-Wound Care: Desitin Maximum Strength Ointment 4 (oz) 3 x Per Week/30 Days Discharge Instructions: peri wound Topical: Gentamicin 3 x Per Week/30 Days Discharge Instructions: Apply to wound bed and tunnel area under the dressing Topical: Santyl Collagenase Ointment, 30 (gm), tube 3 x Per Week/30 Days Discharge Instructions: Apply  nickel thick to wound bed IN OFFICE ONLY Primary Dressing: Silvercel Small 2x2 (in/in) 3 x Per Week/30 Days Discharge Instructions: Apply to wound bed Secondary Dressing: ABD Pad 5x9 (in/in) 3 x Per Week/30 Days Discharge Instructions: Cover with ABD pad on top of dressing Secondary Dressing: Gauze 3 x Per Week/30 Days Discharge Instructions: To hold dressing in place Secured With: Kerlix Roll Sterile or Non-Sterile 6-ply 4.5x4 (yd/yd) 3 x Per Week/30 Days Discharge Instructions: Apply Kerlix as directed Electronic Signature(s) Signed: 06/09/2021 11:45:37 AM By: Kalman Shan DO Signed: 06/09/2021 4:33:59 PM By: Dolan Amen RN Entered By: Dolan Amen on 06/09/2021 11:35:26 Churilla, Annye A. ([id number]) -------------------------------------------------------------------------------- Problem List Details Patient Name: Heather Dear A. Date of Service: 06/09/2021 10:45 AM Medical Record Number: [id number] Patient Account Number: [id number] Date of Birth/Sex: [birth year] (45 y.o. F) Treating RN: Dolan Amen Primary Care Provider: Raelene Bott Other Clinician: Referring Provider: Raelene Bott Treating Provider/Extender: Yaakov Guthrie in Treatment: 11 Active Problems ICD-10 Encounter Code Description Active Date MDM Diagnosis E11.621 Type 2 diabetes mellitus with foot ulcer 03/19/2021 No Yes 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 03/19/2021 No Yes severity E11.42 Type 2 diabetes mellitus with diabetic polyneuropathy 03/19/2021 No Yes Inactive Problems Resolved Problems Electronic Signature(s) Signed: 06/09/2021 11:45:37 AM By: Kalman Shan DO Entered By: Kalman Shan on 06/09/2021 11:40:32 [place]bury, Aleece A. ([id number]) -------------------------------------------------------------------------------- Progress Note Details Patient Name: Heather Dear A. Date of Service: 06/09/2021 10:45 AM Medical Record Number:  [id number] Patient Account Number: [id number] Date of Birth/Sex: 03-29-1976 (45 y.o. F) Treating RN: Dolan Amen Primary Care Provider: Raelene Bott Other Clinician: Referring Provider: Raelene Bott Treating Provider/Extender: Kalman Shan  Weeks in Treatment: 11 Subjective Chief Complaint Information obtained from Patient Left great toe ulcer 03/19/2021; patient referred by Dr. Luana Shu who has been looking after her left foot for quite a period of time for review of a nonhealing area in the left midfoot History of Present Illness (HPI) 01/18/18-She is here for initial evaluation of the left great toe ulcer. She is a poor historian in regards to timeframe in detail. She states approximately 4 weeks ago she lacerated her toe on something in the house. She followed up with her primary care who placed her on Bactrim and ultimately a second dose of Bactrim prior to coming to wound clinic. She states she has been treating the toe with peroxide, Betadine and a Band-Aid. She did not check her blood sugar this morning but checked it yesterday morning it was 327; she is unaware of a recent A1c and there are no current records. She saw Dr. she would've orthopedics last week for an old injury to the left ankle, she states he did not see her toe, nor did she bring it to his attention. She smokes approximately 1 pack cigarettes a day. Her social situation is concerning, she arrives this morning with her mother who appears extremely intoxicated/under the influence; her mother was asked to leave the room and be monitored by the patient's grandmother. The patient's aunt then accompanied the patient and the room throughout the rest of the appointment. We had a lengthy discussion regarding the deleterious effects of uncontrolled hyperglycemia and smoking as it relates to wound healing and overall health. She was strongly encouraged to decrease her smoking and get her diabetes under better control. She states  she is currently on a diet and has cut down her [facility] consumption. The left toe is erythematous, macerated and slightly edematous with malodor present. The edema in her left foot is below her baseline, there is no erythema streaking. We will treat her with Santyl, doxycycline; we have ordered and xray, culture and provided a Peg assist surgical shoe and cultured the wound. 01/25/18-She is here in follow-up evaluation for a left great toe ulcer and presents with an abscess to her suprapubic area. She states her blood sugars remain elevated, feeling "sick" and if levels are below 250, but she is trying. She has made no attempt to decrease her smoking stating that we "can't take away her food in her cigarettes". She has been compliant with offloading using the PEG assist you. She is using Santyl daily. the culture obtained last week grew staph aureus and Enterococcus faecalis; continues on the doxycycline and Augmentin was added on Monday. The suprapubic area has erythema, no femoral variation, purple discoloration, minimal induration, was accessed with a cotton tip applicator with sanguinopurulent drainage, this was cultured, I suspect the current antibiotic treatment will cover and we will not add anything to her current treatment plan. She was advised to go to urgent care or ER with any change in redness, induration or fever. 02/01/18-She is here in follow-up evaluation for left great toe ulcers and a new abdominal abscess from last week. She was able to use packing until earlier this week, where she "forgot it was there". She states she was feeling ill with GI symptoms last week and was not taking her antibiotic. She states her glucose levels have been predominantly less than 200, with occasional levels between 200-250. She thinks this was contributing to her GI symptoms as they have resolved without intervention. There continues to be significant laceration  to left toe, otherwise it clinically  looks stable/improved. There is now less superficial opening to the lateral aspect of the great toe that was residual blister. We will transition to [facility] to all wounds, she will continue her Augmentin. If there is no change or deterioration next week for reculture. 02/08/18-She is here in follow-up evaluation for left great toe ulcer and abdominal ulcer. There is an improvement in both wounds. She has been wrapping her left toe with coban, not by our direction, which has created an area of discoloration to the medial aspect; she has been advised to NOT use coban secondary to her neuropathy. She states her glucose levels have been high over this last week ranging from 200-350, she continues to smoke. She admits to being less compliant with her offloading shoe. We will continue with same treatment plan and she will follow-up next week. 02/15/18-She is here in follow-up evaluation for left great toe ulcer and abdominal ulcer. The abdominal ulcer is epithelialized. The left great toe ulcer is improved and all injury from last week using the Coban wrap is resolved, the lateral ulcer is healed. She admits to noncompliance with wearing offloading shoe and admits to glucose levels being greater than 300 most of the week. She continues to smoke and expresses no desire to quit. There is one area medially that probes deeper than it has historically, erythema to the toe and dorsal foot has consistently waxed and waned. There is no overt signs of cellulitis or infection but we will culture the wound for any occult infection given the new area of depth and erythema. We will hold off on sensitivities for initiation of antibiotic therapy. 02/22/18-She is here in follow up evaluation for left great toe ulcer. There is overall significant improvement in both wound appearance, erythema and edema with changes made last week. She was not initiated on antibiotic therapy. Culture obtained last week showed oxacillin  sensitive staph aureus, sensitive to clindamycin. Clindamycin has been called into the pharmacy but she has been instructed to hold off on initiation secondary to overall clinical improvement and her history of antibiotic intolerance. She has been instructed to contact the clinic with any noted changes/deterioration and the wound, erythema, edema and/or pain. She will follow-up next week. She continues to smoke and her glucose levels remain elevated >250; she admits to compliance with offloading shoe 03/01/18 on evaluation today patient appears to be doing fairly well in regard to her left first toe ulcer. She has been tolerating the dressing changes with the [facility] without complication and overall this has definitely showed signs of improvement according to records as well is what the patient tells me today. I'm very pleased in that regard. She is having no pain today 03/08/18 She is here for follow up evaluation of a left great toe ulcer. She remains non-compliant with glucose control and smoking cessation; glucose levels consistently >200. She states that she got new shoe inserts/peg assist. She admits to compliance with offloading. Since my last evaluation there is significant improvement. We will switch to prisma at this time and she will follow up next week. She is noted to be tachycardic at this appointment, heart rate 120s; she has a history of heart rate 70-130 according to our records. She admits to extreme agitation r/t personal issues; she was advised to monitor her heartrate and contact her physician if it does not return to a more normal range (<100). She takes cardizem twice daily. 03/15/18-She is here in  follow-up evaluation for left great toe ulcer. She remains noncompliant with glucose control and smoking cessation. She admits to compliance with wearing offloading shoe. The ulcer is improved/stable and we will continue with the same treatment plan and she will follow-up  next week 03/22/18-She is here for evaluation for left great toe ulcer. There continues to be significant improvement despite recurrent hyperglycemia (over 500 yesterday) and she continues to smoke. She has been compliant with offloading and we will continue with same treatment plan and she will follow-up next week. [name], [name] ([id number]) 03/29/18-She is here for evaluation for left great toe ulcer. Despite continuing to smoke and uncontrolled diabetes she continues to improve. She is compliant with offloading shoe. We will continue with the same treatment plan and she will follow-up next week 04/05/18- She is here in follow up evaluation for a left great toe ulcer; she presents with small pustule to left fifth toe (resembles ant bite). She admits to compliance with wearing offloading shoe; continues to smoke or have uncontrolled blood glucose control. There is more callus than usual with evidence of bleeding; she denies known trauma. 04/12/18-She is here for evaluation of left great toe ulcer. Despite noncompliance with glycemic control and smoking she continues to make improvement. She continues to wear offloading shoe. The pustule, that was identified last week, to the left fifth toe is resolved. She will follow-up in 2 weeks 05/03/18-she is seen in follow-up evaluation for a left great toe ulcer. She is compliant with offloading, otherwise noncompliant with glycemic control and smoking. She has plateaued and there is minimal improvement noted. We will transition to [facility], replaced the insert to her surgical shoe and she will follow-up in one week 05/10/18- She is here in follow up evaluation for a left great toe ulcer. It appears stable despite measurement change. We will continue with same treatment plan and follow up next week. 05/24/18-She is seen in follow-up evaluation for a left great toe ulcer. She remains compliant with offloading, has made significant improvement in her diet,  decreasing the amount of sugar/soda. She said her recent A1c was 10.9 which is lower than. She did see a diabetic nutritionist/educator yesterday. She continues to smoke. We will continue with the same treatment plan and she'll follow-up next week. 05/31/18- She is seen in follow-up evaluation for left great toe ulcer. She continues to remain compliant with offloading, continues to make improvement in her diet, increasing her water and decreasing the amount of sugar/soda. She does continue to smoke with no desire to quit. We will apply Prisma to the depth and Hydrofera Blue over. We have not received insurance authorization for oasis. She will follow up next week. 06/07/18-She is seen in follow-up evaluation for left great toe ulcer. It has stalled according to today's measurements although base appears stable. She says she saw a diabetic educator yesterday; her average blood sugars are less than 300 which is an improvement for her. She continues to smoke and states "that's my next step" She continues with water over soda. We will order for xray, culture and reinstate ace wrap compression prior to placing apligraf for next week. She is voicing no complaints or concerns. Her dressing will change to iodoflex over the next week in preparation for apligraf. 06/14/18-She is seen in follow-up evaluation for left great toe ulcer. Plain film x-ray performed last week was negative for osteomyelitis. Wound culture obtained last week grew strep B and OSSA; she is initiated on keflex and cefdinir today; there  is erythema to the toe which could be from ace wrap compression, she has a history of wrapping too tight and has has been encouraged to maintain ace wraps that we place today. We will hold off on application of apligraf today, will apply next week after antibiotic therapy has been initiated. She admits today that she has resumed taking a shower with her foot/toe submerged in water, she has been reminded to keep  foot/toe out of the bath water. She will be seen in follow up next week 06/21/18-she is seen in follow-up evaluation for left great toe ulcer. She is tolerating antibiotic therapy with no GI disturbance. The wound is stable. Apligraf was applied today. She has been decreasing her smoking, only had 4 cigarettes yesterday and 1 today. She continues being more compliant in diabetic diet. She will follow-up next week for evaluation of site, if stable will remove at 2 weeks. 06/28/18- She is here in follow up evalution. Apligraf was placed last week, she states the dressing fell off on Tuesday and she was dressing with hydrofera blue. She is healed and will be discharged from the clinic today. She has been instructed to continue with smoking cessation, continue monitoring glucose levels, offloading for an additional 4 weeks and continue with hydrofera blue for additional two weeks for any possible microscopic opening. Readmission: 08/07/18 on evaluation today patient presents for reevaluation concerning the ulcer of her right great toe. She was previously discharged on 06/28/18 healed. Nonetheless she states that this began to show signs of drainage she subsequently went to her primary care provider. Subsequently an x-ray was performed on 08/01/18 which was negative. The patient was also placed on antibiotics at that time. Fortunately they should have been effective for the infection. Nonetheless she's been experiencing some improvement but still has a lot of drainage coming from the wound itself. 08/14/18 on evaluation today patient's wound actually does show signs of improvement in regard to the erythema at this point. She has completed the antibiotics. With that being said we did discuss the possibility of placing her in a total contact cast as of today although I think that I may want to give this just a little bit more time to ensure nothing recurrence as far as her infection is concerned. I do not want  to put in the cast and risk infection at that time if things are not completely resolved. With that being said she is gonna require some debridement today. 08/21/18 on evaluation today patient actually appears to be doing okay in regard to her toe ulcer. She's been tolerating the dressing changes without complication. With that being said it does appear that she is ready and in fact I think it's appropriate for [place] to go ahead and initiate the total contact cast today. Nonetheless she will require some sharp debridement to prepare the wound for application. Overall I feel like things have been progressing well but we do need to do something to get this to close more readily. 08/24/18 patient seen today for reevaluation after having had the total contact cast applied on Tuesday. She seems to have done very well the wound appears to be doing great and overall I'm pleased with the progress that she's made. There were no abnormal areas of rubbing from the cast on her lower extremity. 08/30/18 on evaluation today patient actually appears to be completely healed in regard to her plantar toe ulcer. She tells me at this point she's been having a lot of issues with  the cast. She almost fell a couple of times the state shall the step of her dog a couple times as well. This is been a very frustrating process for her other nonetheless she has completely healed the wound which is excellent news. Overall there does not appear to be the evidence of infection at this time which is great news. 09/11/18 evaluation today patient presents for follow-up concerning her great toe ulcer on the left which has unfortunately reopened since I last saw her which was only a couple of weeks ago. Unfortunately she was not able to get in to get the shoe and potentially the AFO that's gonna be necessary due to her left foot drop. She continues with offloading shoe but this is not enough to prevent her from reopening it appears. When  we last had her in the total contact cast she did well from a healing standpoint but unfortunately the wound reopened as soon as she came out of the cast within just a couple of weeks. 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 This is also part of the reason why she falls on a regular basis. I just do not believe that is gonna be safe for [place] to reinitiate the total contact cast as last time we had this on she fell 3 times one week which is definitely not normal for her. 09/18/18 upon evaluation today the patient actually appears to be doing about the same in regard to her toe ulcer. She did not contact [employer] as I asked her to even though I had given her the prescription. In fact she actually states that she has no idea where the prescription is. She did apparently call [employer] and they told her that all she needed to do was bring the prescription in order to be able to be seen and work on getting the AFO for her left foot. With all that being said she still does not have an appointment and I'm not sure were things stand that regard. I will give her a new prescription today in order to contact them to get this set up. MALAYZIA, [name] ([id number]) 09/25/18 on evaluation today patient actually appears to be doing about the same in regard to her toes ulcer. She does have a small areas which seems to have a lot of callous buildup around the edge of the wound which is going to need sharp debridement today. She still is waiting to be scheduled for evaluation with [employer] for possibility of an AFO. She states there supposed to call her tomorrow to get this set up. Unfortunately it does appear that her foot specifically the toe area is showing signs of erythema. There does not appear to be any systemic infection  which is in these good news. 10/02/18 on evaluation today patient actually appears to be doing about the same in regard to her toe ulcer. This really has not done too well although it's not significantly larger it's also not significantly smaller. She has been tolerating the dressing changes without complication. She actually has her appointment with [facility] and [place] tomorrow to hopefully be measured for obtaining and AFO splint. I think this would be helpful preventing this from reoccurring. We had contemplated starting the cast this week although to be honest I  am reluctant to do that as she's been having nausea, vomiting, and seizure activity over the past three days. She has a history of seizures and have been told is nothing that can be done for these. With that being said I do believe that along with the seizures have the nausea vomiting which upon further questioning doesn't seem to be the normal for her and makes me concerned for the possibility of infection or something else going on. I discussed this with the patient and her mother during the office visit today. I do not feel the wound is effective but maybe something else. The responses this was "this just happens to her at times and we don't know why". They did not seem to be interested in going to the hospital to have this checked out further. 10/09/18 on evaluation today patient presents for follow-up concerning her ongoing toe ulcer. She has been tolerating the dressing changes without complication. Fortunately there does not appear to be any evidence of infection which is great news however I do think that the patient would benefit from going ahead for with the total contact cast. She's actually in a wheelchair today she tells me that she will use her walker if we initiate the cast. I was very specific about the fact that if we were gonna do the cast I wanted to make sure that she was using the walker in order to prevent any  falls. She tells me she does not have stairs that she has to traverse on a regular basis at her home. She has not had any seizures since last week again that something that happens to her often she tells me she did talk to [employer] and they said that it may take up to three weeks to get the brace approved for her. Hopefully that will not take that long but nonetheless in the meantime I do think the cast could be of benefit. 10/12/18 on evaluation today patient appears to be doing rather well in regard to her toe ulcer. It's just been a few days and already this is significantly improved both as far as overall appearance and size. Fortunately there's no sign of infection. She is here for her first obligatory cast change. 10/19/18 Seen today for follow up and management of left great toe ulcer. Wound continues to show improvement. Noted small open area with seroussang drainage with palpation. Denies any increased pain or recent fevers during visit. She will continue calcium alginate with offloading shoe. Denies any questions or concerns during visit. 10/26/18 on evaluation today patient appears to be doing about the same as when I last saw her in regard to her wound bed. Fortunately there does not appear to be any signs of infection. Unfortunately she continues to have a breakdown in regard to the toe region any time that she is not in the cast. It takes almost no time at all for this to happen. Nonetheless she still has not heard anything from the brace being made by [employer] as to when exactly this will be available to her. Fortunately there is no signs of infection at this time. 10/30/18 on evaluation today patient presents for application of the total contact cast as we just received him this morning. Fortunately we are gonna be able to apply this to her today which is great news. She continues to have no significant pain which is good news. Overall I do feel like things have been improving while she was  the cast is when  she doesn't have a cast that things get worse. She still has not really heard anything from [place] regarding her brace. 11/02/18 upon evaluation today patient's wound already appears to be doing significantly better which is good news. Fortunately there does not appear to be any signs of infection also good news. Overall I do think the total contact cast as before is helping to heal this area unfortunately it's just not gonna likely keep the area closed and healed without her getting her brace at least. Again the foot drop is a significant issue for her. 11/09/18 on evaluation today patient appears to be doing excellent in regard to her toe ulcer which in fact is completely healed. Fortunately we finally got the situation squared away with the paperwork which was needed to proceed with getting her brace approved by Medicaid. I have filled that out unfortunately that information has been sent to the orthopedic office that I worked at 2 1/2 years ago and not tired Current wound care measures. Fortunately she seems to be doing very well at this time. 11/23/18 on evaluation today patient appears to be doing More Poorly Compared to Last Time I Saw Her. At [facility] She Had Completely Healed. Currently she is continuing to have issues with reopening. She states that she just found out that the brace was approved through Medicaid now she just has to go get measured in order to have this fitted for her and then made. Subsequently she does not have an appointment for this yet that is going to complicate things we obviously cannot put her back in the cast if we do not have everything measured because they're not gonna be able to measure her foot while she is in the cast. 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 Obviously this is unfortunate and does have me somewhat worried as well. 11/30/18 on  evaluation today patient's toe ulcer actually appears to be doing fairly well. The good news is she will be getting her brace in the shoes next week on Wednesday. Hopefully we will be able to get this to heal without having to go back in the cast however she may need the cast in order to get the wound completely heal and then go from there. Fortunately there's no signs of infection at this time. 12/07/18 on evaluation today patient fortunately did receive her brace and she states she could tell this definitely makes her walk better. With that being said she's been having issues with her toe where she noticed yesterday there was a lot of tissue that was loosing off this appears to be much larger than what it was previous. She also states that her leg has been read putting much across the top of her foot just about the ankle although this seems to be receiving somewhat. The total area is still red and appears to be someone infected as best I can tell. She is previously taken Bactrim and that may be a good option for her today as well. We are gonna see what I wound culture shows as well and I think that this is definitely appropriate. With that being said outside of the culture I still need to initiate something in the interim and that's what I'm gonna go ahead and select Bactrim is a good option for her. 12/14/18 on evaluation today patient appears to be doing better in regard to  her left great toe ulcer as compared to last week's evaluation. There's still some erythema although this is significantly improved which is excellent news. Overall I do believe that she is making good progress is still gonna take some time before she is where I would like her to be from the standpoint of being able to place her back into the total contact cast. Hopefully we will be where we need to be by next week. 12/21/18 on evaluation today patient actually appears to be doing poorly in regard to her toe ulcer. She's been  tolerating the dressing changes without complication. Fortunately there's no signs of systemic infection although she does have a lot of drainage from the toe ulcer and this does Mullaly, Marni A. ([id number]) seem to be causing some issues at this point. She does have erythema on the distal portion of her toe that appears to be likely cellulitis. 12/28/18 on evaluation today patient actually appears to be doing a little better in my pinion in regard to her toe ulcer. With that being said she still does have some evidence of infection at this time and for her culture she had both E. coli as well as enterococcus as organisms noted on evaluation. For that reason I think that though the Keflex likely has treated the E. coli rather well this has really done nothing for the enterococcus. We are going to have to initiate treatment for this specifically. 01/04/19 on evaluation today patient's toe actually appears to be doing better from the standpoint of infection. She currently would like to see about putting the cash back on I think that this is appropriate as long as she takes care of it and keeps it from getting wet. She is gonna have some drainage we can definitely pass this up with Drawtex and alginate to try to prevent as much drainage as possible from causing the problems. With that being said I do want to at least try her with the cast between now and Tuesday. If there any issues we can't continue to use it then I will discontinue the use of the cast at that point. 01/08/19 on evaluation today patient actually appears to be doing very well as far as her foot ulcer specifically the great toe on the left is concerned. She did have an area of rubbing on the medial aspect of her left ankle which again is from the cast. Fortunately there's no signs of infection at this point in this appears to be a very slight skin breakdown. The patient tells me she felt it rubbing but didn't think it was that  bad. Fortunately there is no signs of active infection at this time which is good news. No fevers, chills, nausea, or vomiting noted at this time. 01/15/19 on evaluation today patient actually appears to be doing well in regard to her toe ulcer. Again as previous she seems to do well and she has the cast on which indicates to me that during the time she doesn't have a cast on she's putting way too much pressure on this region. Obviously I think that's gonna be an issue as with the current national emergency concerning the Covid-19 Virus it has been recommended that we discontinue the use of total contact casting by the chief medical officer of our company, Dr. Simona Huh. The reasoning is that if a patient becomes sick and cannot come into have the cast removed they could not just leave this on for an additional two weeks. Obviously the hospitals  also do not want to receive patient's who are sick into the emergency department to potentially contaminate the region and spread the Covid-19 Virus among other sick individuals within the hospital system. Therefore at this point we are suspending the use of total contact cast until the current emergency subsides. This was all discussed with the patient today as well. 01/22/19 on evaluation today patient's wound on her left great toe appears to be doing slightly worse than previously noted last week. She tells me that she has been on this quite a bit in fact she tells me she's been awake for 38 straight hours. This is due to the fact that she's having to care for grandparents because nobody else will. She has been taking care of them for five the last seven days since I've seen her they both have dementia his is from a stroke and her grandmother's was progressive. Nonetheless she states even her mom who knows her condition and situation has only help two of those days to take care of them she's been taking care of the rest. Fortunately there does not appear to be  any signs of active infection in regard to her toe at this point although obviously it doesn't look as good as it did previous. I think this is directly related to her not taking off the pressure and friction by way of taking things easy. Though I completely understand what's going on. 01/29/19 on evaluation today patient's tools are actually appears to be showing some signs of improvement today compared to last week's evaluation as far as not necessarily the overall size of the wound but the fact that she has some new skin growth in between the two ends of the wound opening. Overall I feel like she has done well she states that she had a family member give her what sounds to be a CAM walker boot which has been helpful as well. 02/05/19 on evaluation today patient's wound bed actually appears to be doing significantly better in regard to her overall appearance of the size of the wound. With that being said she is still having an issue with offloading efficiently enough to get this to close. Apparently there is some signs of infection at this point as well unfortunately. Previously she's done well of Augmentin I really do not see anything that needs to be culture currently but there theme and cellulitis of the foot that I'm seeing I'm gonna go ahead and place her on an antibiotic today to try to help clear this up. 02/12/2019 on evaluation today patient actually appears to be doing poorly in regard to her overall wound status. She tells me she has been using her offloading shoe but actually comes in today wearing her tennis shoe with the AFO brace. Again as I previously discussed with her this is really not sufficient to allow the area to heal appropriately. Nonetheless she continues to be somewhat noncompliant and I do wonder based on what she has told my nurse in the past as to whether or not a good portion of this noncompliance may be recreational drug and alcohol related. She has had a history of  heroin overdose and this was fairly recently in the past couple of months that have been seeing her. Nonetheless overall I feel like her wound looks significantly worse today compared to what it was previous. She still has significant erythema despite the Augmentin I am not sure that this is an appropriate medication for her infection I am also  concerned that the infection may have gone down into her bone. 02/19/19 on evaluation today patient actually appears to be doing about the same in regard to her toe ulcer. Unfortunately she continues to show signs of bone exposure and infection at this point. There does not appear to be any evidence of worsening of the infection but I'm also not really sure that it's getting significantly better. She is on the Augmentin which should be sufficient for the Staphylococcus aureus infection that she has at this point. With that being said she may need IV antibiotics to more appropriately treat this. We did have a discussion today about hyperbaric option therapy. 02/28/19 on evaluation today patient actually appears to be doing much worse in regard to the wound on her left great toe as compared to even my previous evaluation last week. Unfortunately this seems to be training in a pretty poor direction. Her toe was actually now starting to angle laterally and I can actually see the entire joint area of the proximal portion of the digit where is the distal portion of the digit again is no longer even in contact with the joint line. Unfortunately there's a lot more necrotic tissue around the edge and the toe appears to be showing signs of becoming gangrenous in my pinion. I'm very concerned about were things stand at this point. She did see infectious disease and they are planning to send in a prescription for Sivextro for her and apparently this has been approved. With that being said I don't think she should avoid taking this but at the same time I'm not sure that it's  gonna be sufficient to save her toe at this point. 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 03/05/19 on evaluation today patient actually appears to be doing quite well in regard to her toe all things considering. She still has bone exposed but there appears to be much less your thing on overall the appearance of the wound and the toe itself is dramatically improved. She still does have some issues currently obviously with infection she did see vascular as well and there concerned that her blood flow to the toad. For that reason they are setting up for an angiogram next week. 03/14/19 on evaluation today patient appears to be doing very poor in regard to her toe and specifically in regard to the ulceration and the fact that she's starting to notice the toe was leaning even more towards the lateral aspect and the complete joint is visible on the proximal aspect of the joint. Nonetheless she's also noted a significant odor and the tip of the toe is turning more dark and necrotic appearing. Overall I think she is getting worse not better as far as this is concerned. For that reason I am recommending at this point that she likely needs to be seen for likely amputation. CADEE, [name] ([id number]) READMISSION 03/19/2021 This is a patient that we cared for in this clinic for a prolonged period of time in 2019 and 2020 with a left foot and left first toe wound. 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 Luana Shu. In December 2021 she had an  ulcer on her right great toe as well as the fourth and fifth toes. 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 She  also had an angiogram at that time and underwent angioplasty of the right anterior tibial artery. In any case she claims that the wound on the right foot is closed I did not look at this today which was probably an oversight although I think that should be done next week. After her surgery she developed a dehiscence but I do not see any follow-up of this. According to Dr. Deborra Medina last review that she was out of the area being cared for by another physician but recently came back to his attention. 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 A culture of this area showed E. coli apparently before she came back to see Dr. Luana Shu she was supposed to be receiving antibiotics but she did not really take them. Nor is she offloading this area at all. Finally her last hemoglobin A1c listed in epic was in March 2022 at 14.1 she says things are a lot better since then although I am not sure. 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 She had this wound at the time. During this admission she had E. coli sepsis a TEE was negative. 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 She is still a heavy cigarette smoker. She has a history of bipolar disorder seizure disorder ABI in our clinic was 1.05 6/1; left midfoot in the setting of a TMA done previously. Round circular wound with a "knuckle" of protruding tissue. The problem is that the knuckle was not attached to any of the surrounding granulation and this probed proximally widely I removed a large portion of this tissue. This wound goes with considerable undermining laterally. I do not feel any bone there was no  purulence but this is a deep wound. 6/8; in spite of the debridement I did last week. She arrives with a wound looking exactly the same. A protruding "knuckle" of tissue nonadherent to most of the surrounding tissue. There is considerable depth around this from 6-12 o'clock at 2.7 cm and undermining of 1 cm. This does not look overtly infected and the x-ray I did last week was negative for any osseous abnormalities. We have been using silver collagen 6/15; deep tissue culture I did last week showed moderate staph aureus and moderate Pseudomonas. This will definitely require prolonged antibiotic therapy. The pathology on the protuberant area was negative for malignancy fungus etc. 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 We have been using silver collagen. I am going to be prescribing Levaquin for 2 weeks. Her CT scan of the foot is down for 7/5 6/22; CT scan of the foot on 7 5. She says she has hardware in the left leg from her previous fracture. She is on the Levaquin for the deep tissue culture I did that showed methicillin sensitive staph aureus and Pseudomonas. I gave her a 2-week supply and she will have another week. She arrives in clinic today with the same protuberant tissue however this is nonadherent to the tissue surrounding it. I am really at a loss to  explain this unless there is underlying deep tissue infection 6/29; patient presents for 1 week follow-up. She has been using collagen to the wound bed. She reports taking her antibiotics as prescribed.She has no complaints or issues today. She denies signs of infection. 7/6; patient presents for one week followup. She has been using collagen to the wound bed. She states she is taking Levaquin however at times she is not able to keep it down. She denies signs of infection. 7/13; patient presents for 1 week follow-up. She has been using silver alginate to the wound bed. She still  has nausea when taking her antibiotics. She denies signs of infection. 7/20; patient presents for 1 week follow-up. She has been using silver alginate with gentamicin cream to the wound bed. She denies any issues and has no complaints today. She denies signs of infection. 7/27; patient presents for 1 week follow-up. She continues to use silver alginate with gentamicin cream to the wound bed. She reports starting her antibiotics. She has no issues or complaints. Overall she reports stability to the wound. 8/3; patient presents for 1 week follow-up. She has been using silver alginate with gentamicin cream to the wound bed. She reports completing all antibiotics. She has no issues or complaints today. She denies signs of infection. Continue8/17; patient presents for 2-week follow-up. He is to use silver alginate to the wound bed. She has no issues or complaints today. She denies signs of infection. She reports her pain has improved in her foot since last clinic visit Patient History Information obtained from Patient. Social History Current every day smoker, Marital Status - Single, Alcohol Use - Never, Drug Use - Prior History, Caffeine Use - Daily. Medical History Ear/Nose/Mouth/Throat Patient has history of Chronic sinus problems/congestion, Middle ear problems Hematologic/Lymphatic Patient has history of Anemia Respiratory Patient has history of Chronic Obstructive Pulmonary Disease (COPD) Cardiovascular Patient has history of Congestive Heart Failure - EF 33% Endocrine Patient has history of Type II Diabetes Genitourinary Patient has history of End Stage Renal Disease Integumentary (Skin) Patient has history of History of pressure wounds Neurologic Patient has history of Neuropathy - feel and legs [name], [name] ([id number]) Objective Constitutional Vitals Time Taken: 11:11 AM, Height: 69 in, Weight: 185 lbs, BMI: 27.3, Temperature: 98.6 F, Pulse: 92 bpm, Respiratory Rate: 18  breaths/min, Blood Pressure: 109/68 mmHg. General Notes: Left foot (Transmetatarsal amputation): Wound to the plantar aspect that that has granulation tissue and nonviable tissue present. There is significant callus circumferentially. Integumentary (Hair, Skin) Wound #7 status is Open. Original cause of wound was Gradually Appeared. The date acquired was: 08/07/2020. The wound has been in treatment 11 weeks. The wound is located on the [place]. The wound measures 1.7cm length x 3cm width x 1cm depth; 4.006cm^2 area and 4.006cm^3 volume. There is Fat Layer (Subcutaneous Tissue) exposed. There is no tunneling noted, however, there is undermining starting at 12:00 and ending at 5:00 with a maximum distance of 0.7cm. There is a large amount of serosanguineous drainage noted. The wound margin is thickened. There is large (67-100%) red, pink, hyper - granulation within the wound bed. There is a small (1-33%) amount of necrotic tissue within the wound bed including Adherent Slough. Assessment Active Problems ICD-10 Type 2 diabetes mellitus with foot ulcer 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ity Type 2 diabetes mellitus with diabetic polyneuropathy Patient presents today with slight improvement to wound appearance. 1 area has actually filled in with granulation tissue.  I debrided nonviable tissue. I recommended continuing silver alginate with gentamicin cream. There were no obvious signs of infection on exam. I think at this time she has a surface compatible for skin substitute. She has already been approved and we will order this to be used at next clinic visit. Procedures Wound #7 Pre-procedure diagnosis of Wound #7 is a Pressure Ulcer located on the Left,Midline,Plantar Foot . There was a Excisional Skin/Subcutaneous Tissue Debridement with a total area of 6 sq cm performed by Kalman Shan, MD. With the following instrument(s):  Curette to remove Viable and Non-Viable tissue/material. Material removed includes Callus, Subcutaneous Tissue, and Slough. A time out was conducted at 11:31, prior to the start of the procedure. A Minimum amount of bleeding was controlled with Pressure. The procedure was tolerated well. Post Debridement Measurements: 1.7cm length x 3cm width x 1cm depth; 4.006cm^3 volume. Post debridement Stage noted as Category/Stage III. Character of Wound/Ulcer Post Debridement is stable. Post procedure Diagnosis Wound #7: Same as Pre-Procedure Plan Follow-up Appointments: Return Appointment in 1 week. Bathing/ Shower/ Hygiene: Clean wound with Normal Saline or wound cleanser. Off-Loading: Open toe surgical shoe with peg assist. Mcray, Debria A. ([id number]) Turn and reposition every 2 hours WOUND #7: - Foot Wound Laterality: Plantar, Left, Midline Cleanser: Soap and Water 3 x Per Week/30 Days Discharge Instructions: Gently cleanse wound with antibacterial soap, rinse and pat dry prior to dressing wounds Peri-Wound Care: Desitin Maximum Strength Ointment 4 (oz) 3 x Per Week/30 Days Discharge Instructions: peri wound Topical: Gentamicin 3 x Per Week/30 Days Discharge Instructions: Apply to wound bed and tunnel area under the dressing Topical: Santyl Collagenase Ointment, 30 (gm), tube 3 x Per Week/30 Days Discharge Instructions: Apply nickel thick to wound bed IN OFFICE ONLY Primary Dressing: Silvercel Small 2x2 (in/in) 3 x Per Week/30 Days Discharge Instructions: Apply to wound bed Secondary Dressing: ABD Pad 5x9 (in/in) 3 x Per Week/30 Days Discharge Instructions: Cover with ABD pad on top of dressing Secondary Dressing: Gauze 3 x Per Week/30 Days Discharge Instructions: To hold dressing in place Secured With: Kerlix Roll Sterile or Non-Sterile 6-ply 4.5x4 (yd/yd) 3 x Per Week/30 Days Discharge Instructions: Apply Kerlix as directed 1. In office sharp debridement 2. Continues silver alginate  and gentamicin cream with dressing changes 3. Follow-up in 1 week Electronic Signature(s) Signed: 06/09/2021 11:45:37 AM By: Kalman Shan DO Entered By: Kalman Shan on 06/09/2021 11:45:06 [name] ([id number]) -------------------------------------------------------------------------------- ROS/PFSH Details Patient Name: Heather Dear A. Date of Service: 06/09/2021 10:45 AM Medical Record Number: [id number] Patient Account Number: [id number] Date of Birth/Sex: [birth year] (45 y.o. F) Treating RN: Dolan Amen Primary Care Provider: Raelene Bott Other Clinician: Referring Provider: Raelene Bott Treating Provider/Extender: Yaakov Guthrie in Treatment: 11 Information Obtained From Patient Ear/Nose/Mouth/Throat Medical History: Positive for: Chronic sinus problems/congestion; Middle ear problems Hematologic/Lymphatic Medical History: Positive for: Anemia Respiratory Medical History: Positive for: Chronic Obstructive Pulmonary Disease (COPD) Cardiovascular Medical History: Positive for: Congestive Heart Failure - EF 33% Endocrine Medical History: Positive for: Type II Diabetes Time with diabetes: 14 years Treated with: Insulin, Oral agents Blood sugar tested every day: No Blood sugar testing results: Bedtime: 176 Genitourinary Medical History: Positive for: End Stage Renal Disease Integumentary (Skin) Medical History: Positive for: History of pressure wounds Neurologic Medical History: Positive for: Neuropathy - feel and legs HBO Extended History Items Ear/Nose/Mouth/Throat: Ear/Nose/Mouth/Throat: Chronic sinus problems/congestion Middle ear problems Immunizations Pneumococcal Vaccine: Received Pneumococcal Vaccination: No [name], [name] ([id number]) Implantable Devices No devices added Family  and Social History Current every day smoker; Marital Status - Single; Alcohol Use: Never; Drug Use: Prior History; Caffeine Use:  Daily Electronic Signature(s) Signed: 06/09/2021 11:45:37 AM By: Kalman Shan DO Signed: 06/09/2021 4:33:59 PM By: Dolan Amen RN Entered By: Kalman Shan on 06/09/2021 11:41:48 [name] ([id number]) -------------------------------------------------------------------------------- [place] Details Patient Name: Heather Dear A. Date of Service: 06/09/2021 Medical Record Number: [id number] Patient Account Number: [id number] Date of Birth/Sex: 1976-07-02 (45 y.o. F) Treating RN: Dolan Amen Primary Care Provider: Raelene Bott Other Clinician: Referring Provider: Raelene Bott Treating Provider/Extender: Yaakov Guthrie in Treatment: 11 Diagnosis Coding ICD-10 Codes Code Description E11.621 Type 2 diabetes mellitus with foot ulcer 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ity E11.42 Type 2 diabetes mellitus with diabetic polyneuropathy Facility Procedures CPT4 Code: [id number] Description: [id number] - DEB SUBQ TISSUE 20 SQ CM/< Modifier: Quantity: 1 CPT4 Code: Description: ICD-10 Diagnosis Description 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 Modifier: severity Quantity: Physician Procedures CPT4 Code: [id number] Description: 11042 - WC PHYS SUBQ TISS 20 SQ CM Modifier: Quantity: 1 CPT4 Code: Description: ICD-10 Diagnosis Description 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 Modifier: severity Quantity: Electronic Signature(s) Signed: 06/09/2021 11:45:37 AM By: Kalman Shan DO Entered By: Kalman Shan on 06/09/2021 11:45:12

## 2021-06-09 NOTE — Progress Notes (Signed)
BEYA, TIPPS ([id number]) Visit Report for 06/09/2021 Arrival Information Details Patient Name: [name], [name] Date of Service: 06/09/2021 10:45 AM Medical Record Number: [id number] Patient Account Number: [id number] Date of Birth/Sex: [birth year] (45 y.o. F) Treating RN: [name] Primary Care [name]: [name] Other Clinician: Referring [name]: [name] Treating [name]/Extender: [name] in Treatment: 11 Visit Information History Since Last Visit Pain Present Now: Yes Patient Arrived: Wheel Chair Arrival Time: 11:11 Accompanied By: mother Transfer Assistance: None Patient Identification Verified: Yes Secondary Verification Process Completed: Yes Patient Requires Transmission-Based No Precautions: Patient Has Alerts: Yes Patient Alerts: Patient on Blood Thinner 57m apirin Diabetic Type II Electronic Signature(s) Signed: 06/09/2021 4:33:59 PM By: SDolan [name] Entered By: SDolan Amenon 06/09/2021 11:11:23 SLonzo [name]([id number] -------------------------------------------------------------------------------- Clinic Level of Care Assessment Details Patient Name: Heather DearA. Date of Service: 06/09/2021 10:45 AM Medical Record Number: [id number] Account Number: [id number] of Birth/Sex: [birth year](45y.o. F) Treating RN: SDolan AmenPrimary Care [name]: HRaelene BottOther Clinician: Referring [name]: HRaelene BottTreating [name]/Extender: HYaakov Guthriein Treatment: 11 Clinic Level of Care Assessment Items TOOL 1 Quantity Score []  - Use when EandM and Procedure is performed on INITIAL visit 0 ASSESSMENTS - Nursing Assessment / Reassessment []  - General Physical Exam (combine w/ comprehensive assessment (listed just below) when performed on new 0 pt. evals) []  - 0 Comprehensive Assessment (HX, ROS, Risk Assessments, Wounds Hx, etc.) ASSESSMENTS - Wound and Skin Assessment /  Reassessment []  - Dermatologic / Skin Assessment (not related to wound area) 0 ASSESSMENTS - Ostomy and/or Continence Assessment and Care []  - Incontinence Assessment and Management 0 []  - 0 Ostomy Care Assessment and Management (repouching, etc.) PROCESS - Coordination of Care []  - Simple Patient / Family Education for ongoing care 0 []  - 0 Complex (extensive) Patient / Family Education for ongoing care []  - 0 Staff obtains CProgrammer, systems Records, Test Results / Process Orders []  - 0 Staff telephones HHA, Nursing Homes / Clarify orders / etc []  - 0 Routine Transfer to another Facility (non-emergent condition) []  - 0 Routine Hospital Admission (non-emergent condition) []  - 0 New Admissions / IBiomedical engineer/ Ordering NPWT, Apligraf, etc. []  - 0 Emergency Hospital Admission (emergent condition) PROCESS - Special Needs []  - Pediatric / Minor Patient Management 0 []  - 0 Isolation Patient Management []  - 0 Hearing / Language / Visual special needs []  - 0 Assessment of Community assistance (transportation, D/C planning, etc.) []  - 0 Additional assistance / Altered mentation []  - 0 Support Surface(s) Assessment (bed, cushion, seat, etc.) INTERVENTIONS - Miscellaneous []  - External ear exam 0 []  - 0 Patient Transfer (multiple staff / HCivil Service fast streamer/ Similar devices) []  - 0 Simple Staple / Suture removal (25 or less) []  - 0 Complex Staple / Suture removal (26 or more) []  - 0 Hypo/Hyperglycemic Management (do not check if billed separately) []  - 0 Ankle / Brachial Index (ABI) - do not check if billed separately Has the patient been seen at the hospital within the last three years: Yes Total Score: 0 Level Of Care: ____ SLonzo [name]([id number] Electronic Signature(s) Signed: 06/09/2021 4:33:59 PM By: SDolan [name] Entered By: SDolan Amenon 06/09/2021 11:36:04 SLonzo [name] ([id number] -------------------------------------------------------------------------------- Lower Extremity Assessment Details Patient Name: Heather DearA. Date of Service: 06/09/2021 10:45 AM Medical Record Number: [id number] Account Number: [id number] of Birth/Sex: [birth year](45y.o. F) Treating RN: SDolan [name]  Primary Care Nery Kalisz: [name] Other Clinician: Referring [name]: [name] Treating [name]/Extender: [name] in Treatment: 11 Edema Assessment Assessed: [Left: Yes] [Right: No] Edema: [Left: N] [Right: o] Vascular Assessment Pulses: Dorsalis Pedis Palpable: [Left:Yes] Electronic Signature(s) Signed: 06/09/2021 4:33:59 PM By: Heather Amen RN Entered By: [name] on 06/09/2021 11:20:57 [name], [name] ([id number]) -------------------------------------------------------------------------------- Multi Wound Chart Details Patient Name: Heather Dear A. Date of Service: 06/09/2021 10:45 AM Medical Record Number: [id number] Patient Account Number: [id number] Date of Birth/Sex: Jan 13, 1976 (45 y.o. F) Treating RN: [name] Primary Care Ziere Docken: [name] Other Clinician: Referring Reyhan Moronta: [name] Treating Danielys Madry/Extender: [name] in Treatment: 11 Vital Signs Height(in): 107 Pulse(bpm): 72 Weight(lbs): 185 Blood Pressure(mmHg): 109/68 Body Mass Index(BMI): 27 Temperature(F): 98.6 Respiratory Rate(breaths/min): 18 Photos: [N/A:N/A] Wound Location: Left, Midline, Plantar Foot N/A N/A Wounding Event: Gradually Appeared N/A N/A Primary Etiology: Pressure Ulcer N/A N/A Comorbid History: Chronic sinus problems/congestion, N/A N/A Middle ear problems, Anemia, Chronic Obstructive Pulmonary Disease (COPD), Congestive Heart Failure, Type II Diabetes, End Stage Renal Disease, History of pressure wounds, Neuropathy Date Acquired: 08/07/2020 N/A N/A Weeks of Treatment: 11 N/A  N/A Wound Status: Open N/A N/A Measurements L x W x D (cm) 1.7x3x1 N/A N/A Area (cm) : 4.006 N/A N/A Volume (cm) : 4.006 N/A N/A % Reduction in Area: -50.00% N/A N/A % Reduction in Volume: -200.10% N/A N/A Starting Position 1 (o'clock): 12 Ending Position 1 (o'clock): 5 Maximum Distance 1 (cm): 0.7 Undermining: Yes N/A N/A Classification: Category/Stage II N/A N/A Exudate Amount: Large N/A N/A Exudate Type: Serosanguineous N/A N/A Exudate Color: red, brown N/A N/A Wound Margin: Thickened N/A N/A Granulation Amount: Large (67-100%) N/A N/A Granulation Quality: Red, Pink, Hyper-granulation N/A N/A Necrotic Amount: Small (1-33%) N/A N/A Exposed Structures: Fat Layer (Subcutaneous Tissue): N/A N/A Yes Fascia: No Tendon: No Muscle: No Joint: No Bone: No Epithelialization: None N/A N/A Debridement: Debridement - Excisional N/A N/A Pre-procedure Verification/Time 11:31 N/A N/A Out Taken: Tissue Debrided: Callus, Subcutaneous, Slough N/A N/A Level: Skin/Subcutaneous Tissue N/A N/A [name], [name] ([id number]) Debridement Area (sq cm): 6 N/A N/A Instrument: Curette N/A N/A Bleeding: Minimum N/A N/A Hemostasis Achieved: Pressure N/A N/A Debridement Treatment Procedure was tolerated well N/A N/A Response: Post Debridement 1.7x3x1 N/A N/A Measurements L x W x D (cm) Post Debridement Volume: 4.006 N/A N/A (cm) Post Debridement Stage: Category/Stage III N/A N/A Procedures Performed: Debridement N/A N/A Treatment Notes Electronic Signature(s) Signed: 06/09/2021 11:45:37 AM By: Kalman Shan DO Entered By: Kalman Shan on 06/09/2021 11:40:39 [name] -------------------------------------------------------------------------------- Multi-Disciplinary Care Plan Details Patient Name: Heather Dear A. Date of Service: 06/09/2021 10:45 AM Medical Record Number: [id number] Patient Account Number: [id number] Date of Birth/Sex: 03-30-1976 (45 y.o.  F) Treating RN: [name] Primary Care Marcie Shearon: [name] Other Clinician: Referring Kaelon Weekes: [name] Treating Dillard Pascal/Extender: [name] in Treatment: 11 Active Inactive Wound/Skin Impairment Nursing Diagnoses: Knowledge deficit related to ulceration/compromised skin integrity Goals: Patient/caregiver will verbalize understanding of skin care regimen Date Initiated: 03/19/2021 Date Inactivated: 04/28/2021 Target Resolution Date: 04/19/2021 Goal Status: Met Ulcer/skin breakdown will have a volume reduction of 30% by week 4 Date Initiated: 03/19/2021 Date Inactivated: 04/28/2021 Target Resolution Date: 04/19/2021 Goal Status: Unmet Unmet Reason: con't tx Ulcer/skin breakdown will have a volume reduction of 50% by week 8 Date Initiated: 03/19/2021 Target Resolution Date: 05/19/2021 Goal Status: Active Ulcer/skin breakdown will have a volume reduction of 80% by week 12 Date Initiated: 03/19/2021 Target Resolution Date: 06/19/2021  Goal Status: Active Ulcer/skin breakdown will heal within 14 weeks Date Initiated: 03/19/2021 Target Resolution Date: 07/20/2021 Goal Status: Active Interventions: Assess patient/caregiver ability to obtain necessary supplies Assess patient/caregiver ability to perform ulcer/skin care regimen upon admission and as needed Assess ulceration(s) every visit Notes: Electronic Signature(s) Signed: 06/09/2021 4:33:59 PM By: Heather Amen RN Entered By: [name] on 06/09/2021 11:30:24 Heather Dear A. ([id number]) -------------------------------------------------------------------------------- Pain Assessment Details Patient Name: Heather Dear A. Date of Service: 06/09/2021 10:45 AM Medical Record Number: [id number] Patient Account Number: [id number] Date of Birth/Sex: Sep 06, 1976 (45 y.o. F) Treating RN: [name] Primary Care Jameal Razzano: [name] Other Clinician: Referring Aurel Nguyen: [name] Treating  Vineta Carone/Extender: [name] in Treatment: 11 Active Problems Location of Pain Severity and Description of Pain Patient Has Paino Yes Site Locations Pain Location: Pain in Ulcers Rate the pain. Current Pain Level: 10 Pain Management and Medication Current Pain Management: Electronic Signature(s) Signed: 06/09/2021 4:33:59 PM By: Heather Amen RN Entered By: [name] on 06/09/2021 11:13:07 [name] ([id number]) -------------------------------------------------------------------------------- Patient/Caregiver Education Details Patient Name: Heather Dear A. Date of Service: 06/09/2021 10:45 AM Medical Record Number: [id number] Patient Account Number: [id number] Date of Birth/Gender: 05/04/1976 (45 y.o. F) Treating RN: [name] Primary Care Physician: [name] Other Clinician: Referring Physician: Raelene [name] Treating Physician/Extender: [name] in Treatment: 11 Education Assessment Education Provided To: Patient Education Topics Provided Wound/Skin Impairment: Methods: Explain/Verbal Responses: State content correctly Electronic Signature(s) Signed: 06/09/2021 4:33:59 PM By: Heather Amen RN Entered By: [name] on 06/09/2021 11:36:24 [name] ([id number]) -------------------------------------------------------------------------------- Wound Assessment Details Patient Name: Heather Dear A. Date of Service: 06/09/2021 10:45 AM Medical Record Number: [id number] Patient Account Number: [id number] Date of Birth/Sex: 1976/04/21 (45 y.o. F) Treating RN: [name] Primary Care Coda Filler: [name] Other Clinician: Referring Clorissa Gruenberg: [name] Treating Saburo Luger/Extender: [name] in Treatment: 11 Wound Status Wound Number: 7 Primary Pressure Ulcer Etiology: Wound Location: Left, Midline, Plantar Foot Wound Open Wounding Event: Gradually Appeared Status: Date Acquired:  08/07/2020 Comorbid Chronic sinus problems/congestion, Middle ear problems, Weeks Of Treatment: 11 History: Anemia, Chronic Obstructive Pulmonary Disease (COPD), Clustered Wound: No Congestive Heart Failure, Type II Diabetes, End Stage Renal Disease, History of pressure wounds, Neuropathy Photos Wound Measurements Length: (cm) 1.7 Width: (cm) 3 Depth: (cm) 1 Area: (cm) 4.006 Volume: (cm) 4.006 % Reduction in Area: -50% % Reduction in Volume: -200.1% Epithelialization: None Tunneling: No Undermining: Yes Starting Position (o'clock): 12 Ending Position (o'clock): 5 Maximum Distance: (cm) 0.7 Wound Description Classification: Category/Stage II Wound Margin: Thickened Exudate Amount: Large Exudate Type: Serosanguineous Exudate Color: red, brown Foul Odor After Cleansing: No Slough/Fibrino Yes Wound Bed Granulation Amount: Large (67-100%) Exposed Structure Granulation Quality: Red, Pink, Hyper-granulation Fascia Exposed: No Necrotic Amount: Small (1-33%) Fat Layer (Subcutaneous Tissue) Exposed: Yes Necrotic Quality: Adherent Slough Tendon Exposed: No Muscle Exposed: No Joint Exposed: No Bone Exposed: No Electronic Signature(s) Signed: 06/09/2021 4:33:59 PM By: Heather Amen RN Entered By: [name] on 06/09/2021 11:19:14 [name] ([id number]) [name], [name] Kitchen ([id number]) -------------------------------------------------------------------------------- Vitals Details Patient Name: Heather Dear A. Date of Service: 06/09/2021 10:45 AM Medical Record Number: [id number] Patient Account Number: [id number] Date of Birth/Sex: Feb 05, 1976 (45 y.o. F) Treating RN: [name] Primary Care Nayanna Seaborn: [name] Other Clinician: Referring Lizzette Carbonell: [name] Treating Charene Mccallister/Extender: [name] in Treatment: 11 Vital Signs Time Taken: 11:11 Temperature (F): 98.6 Height (in): 69 Pulse (bpm): 92 Weight (lbs): 185 Respiratory Rate  (  breaths/min): 18 Body Mass Index (BMI): 27.3 Blood Pressure (mmHg): 109/68 Reference Range: 80 - 120 mg / dl Electronic Signature(s) Signed: 06/09/2021 4:33:59 PM By: Heather Amen RN Entered By: [name] on 06/09/2021 11:12:41

## 2021-06-16 ENCOUNTER — Encounter (HOSPITAL_BASED_OUTPATIENT_CLINIC_OR_DEPARTMENT_OTHER): Payer: Medicaid Other | Admitting: Internal Medicine

## 2021-06-16 ENCOUNTER — Other Ambulatory Visit: Payer: Self-pay

## 2021-06-16 DIAGNOSIS — L97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ity: Secondary | ICD-10-CM | POA: Diagnosis not present

## 2021-06-16 DIAGNOSIS — E1142 Type 2 diabetes mellitus with diabetic polyneuropathy: Secondary | ICD-10-CM

## 2021-06-16 DIAGNOSIS — E11621 Type 2 diabetes mellitus with foot ulcer: Secondary | ICD-10-CM | POA: Diagnosis not present

## 2021-06-16 NOTE — Progress Notes (Signed)
Heather, [name] ([id number]) Visit Report for 06/16/2021 Arrival Information Details Patient Name: [name], [name] Date of Service: 06/16/2021 10:45 AM Medical Record Number: [id number] Patient Account Number: [id number] Date of Birth/Sex: Apr 13, 1976 (45 y.o. F) Treating RN: Dolan Amen Primary Care [name]: [name] Other Clinician: Referring [name]: [name] Treating Mckenzey Parcell/Extender: [name] in Treatment: 12 Visit Information History Since Last Visit Pain Present Now: Yes Patient Arrived: Wheel Chair Arrival Time: 10:45 Accompanied By: mother Transfer Assistance: None Patient Identification Verified: Yes Secondary Verification Process Completed: Yes Patient Requires Transmission-Based No Precautions: Patient Has Alerts: Yes Patient Alerts: Patient on Blood Thinner 4m apirin Diabetic Type II Electronic Signature(s) Signed: 06/16/2021 1:41:52 PM By: SDolan AmenRN Entered By: SDolan Amenon 06/16/2021 10:46:19 Amaker, Harry A. ([id number] -------------------------------------------------------------------------------- Lower Extremity Assessment Details Patient Name: Heather DearA. Date of Service: 06/16/2021 10:45 AM Medical Record Number: [id number] Account Number: [id number] of Birth/Sex: 9February 02, 1977(45y.o. F) Treating RN: SDolan AmenPrimary Care Rabia Argote: HRaelene BottOther Clinician: Referring Talan Gildner: HRaelene BottTreating Taraji Mungo/Extender: HYaakov Guthriein Treatment: 12 Edema Assessment Assessed: [Left: Yes] [Right: No] Edema: [Left: N] [Right: o] Vascular Assessment Pulses: Posterior Tibial Palpable: [Left:Yes] Electronic Signature(s) Signed: 06/16/2021 1:41:52 PM By: SDolan AmenRN Entered By: SDolan Amenon 06/16/2021 10:53:14 Hockenberry, Ayianna A. ([id number] -------------------------------------------------------------------------------- Multi Wound Chart Details Patient  Name: Heather DearA. Date of Service: 06/16/2021 10:45 AM Medical Record Number: [id number] Account Number: [id number] of Birth/Sex: [birth year](45y.o. F) Treating RN: SDolan AmenPrimary Care [name]: HRaelene BottOther Clinician: Referring [name]: HRaelene BottTreating Radford Pease/Extender: HYaakov Guthriein Treatment: 12 Vital Signs Height(in): 630Pulse(bpm): 954Weight(lbs): 185 Blood Pressure(mmHg): 109/74 Body Mass Index(BMI): 27 Temperature(F): 98.7 Respiratory Rate(breaths/min): 18 Photos: [N/A:N/A] Wound Location: Left, Midline, Plantar Foot N/A N/A Wounding Event: Gradually Appeared N/A N/A Primary Etiology: Pressure Ulcer N/A N/A Comorbid History: Chronic sinus problems/congestion, N/A N/A Middle ear problems, Anemia, Chronic Obstructive Pulmonary Disease (COPD), Congestive Heart Failure, Type II Diabetes, End Stage Renal Disease, History of pressure wounds, Neuropathy Date Acquired: 08/07/2020 N/A N/A Weeks of Treatment: 12 N/A N/A Wound Status: Open N/A N/A Measurements L x W x D (cm) 2x3x1 N/A N/A Area (cm) : 4.712 N/A N/A Volume (cm) : 4.712 N/A N/A % Reduction in Area: -76.50% N/A N/A % Reduction in Volume: -253.00% N/A N/A Classification: Category/Stage II N/A N/A Exudate Amount: Medium N/A N/A Exudate Type: Serosanguineous N/A N/A Exudate Color: red, brown N/A N/A Wound Margin: Thickened N/A N/A Granulation Amount: Medium (34-66%) N/A N/A Granulation Quality: Red, Pink, Hyper-granulation N/A N/A Necrotic Amount: Medium (34-66%) N/A N/A Exposed Structures: Fat Layer (Subcutaneous Tissue): N/A N/A Yes Fascia: No Tendon: No Muscle: No Joint: No Bone: No Epithelialization: None N/A N/A Debridement: Debridement - Excisional N/A N/A Pre-procedure Verification/Time 11:06 N/A N/A Out Taken: Tissue Debrided: Callus, Subcutaneous N/A N/A Level: Skin/Subcutaneous Tissue N/A N/A Debridement Area (sq cm): 6 N/A  N/A Instrument: Curette N/A N/A Bleeding: Minimum N/A N/A Hemostasis Achieved: Pressure N/A N/A [name], [name] ([id number] Debridement Treatment Procedure was tolerated well N/A N/A Response: Post Debridement 2x3x1 N/A N/A Measurements L x W x D (cm) Post Debridement Volume: 4.712 N/A N/A (cm) Post Debridement Stage: Category/Stage II N/A N/A Procedures Performed: Debridement N/A N/A Treatment Notes Electronic Signature(s) Signed: 06/16/2021 11:20:57 AM By: HKalman ShanDO Entered By: HKalman Shanon 06/16/2021 11:16:12 [name] Heather([id number] -------------------------------------------------------------------------------- MWatchtowerDetails Patient Name: Heather Dear  A. Date of Service: 06/16/2021 10:45 AM Medical Record Number: [id number] Patient Account Number: [id number] Date of Birth/Sex: 06/17/1976 (45 y.o. F) Treating RN: Dolan Amen Primary Care [name]: [name] Other Clinician: Referring [name]: [name] Treating Kwadwo Taras/Extender: [name] in Treatment: 12 Active Inactive Wound/Skin Impairment Nursing Diagnoses: Knowledge deficit related to ulceration/compromised skin integrity Goals: Patient/caregiver will verbalize understanding of skin care regimen Date Initiated: 03/19/2021 Date Inactivated: 04/28/2021 Target Resolution Date: 04/19/2021 Goal Status: Met Ulcer/skin breakdown will have a volume reduction of 30% by week 4 Date Initiated: 03/19/2021 Date Inactivated: 04/28/2021 Target Resolution Date: 04/19/2021 Goal Status: Unmet Unmet Reason: con't tx Ulcer/skin breakdown will have a volume reduction of 50% by week 8 Date Initiated: 03/19/2021 Date Inactivated: 06/16/2021 Target Resolution Date: 05/19/2021 Goal Status: Unmet Unmet Reason: comorbidities Ulcer/skin breakdown will have a volume reduction of 80% by week 12 Date Initiated: 03/19/2021 Date Inactivated: 06/16/2021 Target Resolution Date:  06/19/2021 Goal Status: Unmet Unmet Reason: comorbidities Ulcer/skin breakdown will heal within 14 weeks Date Initiated: 03/19/2021 Target Resolution Date: 07/20/2021 Goal Status: Active Interventions: Assess patient/caregiver ability to obtain necessary supplies Assess patient/caregiver ability to perform ulcer/skin care regimen upon admission and as needed Assess ulceration(s) every visit Notes: Electronic Signature(s) Signed: 06/16/2021 1:41:52 PM By: Dolan Amen RN Entered By: Dolan Amen on 06/16/2021 10:55:08 [name] ([id number]) -------------------------------------------------------------------------------- Pain Assessment Details Patient Name: [name] Date of Service: 06/16/2021 10:45 AM Medical Record Number: [id number] Patient Account Number: [id number] Date of Birth/Sex: Oct 06, 1976 (45 y.o. F) Treating RN: Dolan Amen Primary Care Kameah Rawl: [name] Other Clinician: Referring Dovey Fatzinger: [name] Treating Miel Wisener/Extender: [name] in Treatment: 12 Active Problems Location of Pain Severity and Description of Pain Patient Has Paino Yes Site Locations Pain Location: Pain in Ulcers Rate the pain. Current Pain Level: 10 Pain Management and Medication Current Pain Management: Electronic Signature(s) Signed: 06/16/2021 1:41:52 PM By: Dolan Amen RN Entered By: Dolan Amen on 06/16/2021 10:47:03 Majano, Tamiya A. ([id number]) -------------------------------------------------------------------------------- Wound Assessment Details Patient Name: [name] Date of Service: 06/16/2021 10:45 AM Medical Record Number: [id number] Patient Account Number: [id number] Date of Birth/Sex: 09-06-1976 (45 y.o. F) Treating RN: Dolan Amen Primary Care Dekker Verga: [name] Other Clinician: Referring Lisa Milian: [name] Treating Sheresa Cullop/Extender: [name] in Treatment: 12 Wound Status Wound  Number: 7 Primary Pressure Ulcer Etiology: Wound Location: Left, Midline, Plantar Foot Wound Open Wounding Event: Gradually Appeared Status: Date Acquired: 08/07/2020 Comorbid Chronic sinus problems/congestion, Middle ear problems, Weeks Of Treatment: 12 History: Anemia, Chronic Obstructive Pulmonary Disease (COPD), Clustered Wound: No Congestive Heart Failure, Type II Diabetes, End Stage Renal Disease, History of pressure wounds, Neuropathy Photos Wound Measurements Length: (cm) 2 Width: (cm) 3 Depth: (cm) 1 Area: (cm) 4.712 Volume: (cm) 4.712 % Reduction in Area: -76.5% % Reduction in Volume: -253% Epithelialization: None Tunneling: No Undermining: No Wound Description Classification: Category/Stage II Wound Margin: Thickened Exudate Amount: Medium Exudate Type: Serosanguineous Exudate Color: red, brown Foul Odor After Cleansing: No Slough/Fibrino Yes Wound Bed Granulation Amount: Medium (34-66%) Exposed Structure Granulation Quality: Red, Pink, Hyper-granulation Fascia Exposed: No Necrotic Amount: Medium (34-66%) Fat Layer (Subcutaneous Tissue) Exposed: Yes Necrotic Quality: Adherent Slough Tendon Exposed: No Muscle Exposed: No Joint Exposed: No Bone Exposed: No Electronic Signature(s) Signed: 06/16/2021 1:41:52 PM By: Dolan Amen RN Entered By: Dolan Amen on 06/16/2021 10:52:52 Lonzo Heather ([id number]) -------------------------------------------------------------------------------- Vitals Details Patient Name: [name] Date of Service: 06/16/2021 10:45 AM Medical Record Number: [id number]  Patient Account Number: [id number] Date of Birth/Sex: 01-31-1976 (45 y.o. F) Treating RN: Dolan Amen Primary Care Staisha Winiarski: [name] Other Clinician: Referring Ioan Landini: [name] Treating Jiovanna Frei/Extender: [name] in Treatment: 12 Vital Signs Time Taken: 10:46 Temperature (F): 98.7 Height (in): 69 Pulse (bpm):  90 Weight (lbs): 185 Respiratory Rate (breaths/min): 18 Body Mass Index (BMI): 27.3 Blood Pressure (mmHg): 109/74 Reference Range: 80 - 120 mg / dl Electronic Signature(s) Signed: 06/16/2021 1:41:52 PM By: Dolan Amen RN Entered By: Dolan Amen on 06/16/2021 10:46:40

## 2021-06-16 NOTE — Progress Notes (Signed)
RENNAE, WALDNER ([id number]) Visit Report for 06/16/2021 Chief Complaint Document Details Patient Name: [name], [name] Date of Service: 06/16/2021 10:45 AM Medical Record Number: [id number] Patient Account Number: [id number] Date of Birth/Sex: 1976/03/11 (45 y.o. F) Treating RN: Dolan Amen Primary Care Provider: Raelene Bott Other Clinician: Referring Provider: Raelene Bott Treating Provider/Extender: Yaakov Guthrie in Treatment: 12 Information Obtained from: Patient Chief Complaint Left great toe ulcer 03/19/2021; patient referred by Dr. Luana Shu who has been looking after her left foot for quite a period of time for review of a nonhealing area in the left midfoot Electronic Signature(s) Signed: 06/16/2021 11:20:57 AM By: Kalman Shan DO Entered By: Kalman Shan on 06/16/2021 11:16:31 Sluder, Nguyen A. ([id number]) -------------------------------------------------------------------------------- Debridement Details Patient Name: Heather Dear A. Date of Service: 06/16/2021 10:45 AM Medical Record Number: [id number] Patient Account Number: [id number] Date of Birth/Sex: December 08, 1975 (45 y.o. F) Treating RN: Dolan Amen Primary Care Provider: Raelene Bott Other Clinician: Referring Provider: Raelene Bott Treating Provider/Extender: Yaakov Guthrie in Treatment: 12 Debridement Performed for Wound #7 Left,Midline,Plantar Foot Assessment: Performed By: Physician Kalman Shan, MD Debridement Type: Debridement Level of Consciousness (Pre- Awake and Alert procedure): Pre-procedure Verification/Time Out Yes - 11:06 Taken: Start Time: 11:06 Total Area Debrided (L x W): 2 (cm) x 3 (cm) = 6 (cm) Tissue and other material Viable, Non-Viable, Callus, Subcutaneous debrided: Level: Skin/Subcutaneous Tissue Debridement Description: Excisional Instrument: Curette Bleeding: Minimum Hemostasis Achieved: Pressure Response to Treatment: Procedure was  tolerated well Level of Consciousness (Post- Awake and Alert procedure): Post Debridement Measurements of Total Wound Length: (cm) 2 Stage: Category/Stage II Width: (cm) 3 Depth: (cm) 1 Volume: (cm) 4.712 Character of Wound/Ulcer Post Debridement: Stable Post Procedure Diagnosis Same as Pre-procedure Electronic Signature(s) Signed: 06/16/2021 11:20:57 AM By: Kalman Shan DO Signed: 06/16/2021 1:41:52 PM By: Dolan Amen RN Entered By: Dolan Amen on 06/16/2021 11:09:30 [name], [name] ([id number]) -------------------------------------------------------------------------------- HPI Details Patient Name: Heather Dear A. Date of Service: 06/16/2021 10:45 AM Medical Record Number: [id number] Patient Account Number: [id number] Date of Birth/Sex: [birth year] (45 y.o. F) Treating RN: Dolan Amen Primary Care Provider: Raelene Bott Other Clinician: Referring Provider: Raelene Bott Treating Provider/Extender: Yaakov Guthrie in Treatment: 12 History of Present Illness HPI Description: 01/18/18-She is here for initial evaluation of the left great toe ulcer. She is a poor historian in regards to timeframe in detail. She states approximately 4 weeks ago she lacerated her toe on something in the house. She followed up with her primary care who placed her on Bactrim and ultimately a second dose of Bactrim prior to coming to wound clinic. She states she has been treating the toe with peroxide, Betadine and a Band-Aid. She did not check her blood sugar this morning but checked it yesterday morning it was 327; she is unaware of a recent A1c and there are no current records. She saw Dr. she would've orthopedics last week for an old injury to the left ankle, she states he did not see her toe, nor did she bring it to his attention. She smokes approximately 1 pack cigarettes a day. Her social situation is concerning, she arrives this morning with her mother who appears extremely  intoxicated/under the influence; her mother was asked to leave the room and be monitored by the patient's grandmother. The patient's aunt then accompanied the patient and the room throughout the rest of the appointment. We had a lengthy discussion regarding the deleterious effects of uncontrolled hyperglycemia and smoking as it relates to wound healing  and overall health. She was strongly encouraged to decrease her smoking and get her diabetes under better control. She states she is currently on a diet and has cut down her [facility] consumption. The left toe is erythematous, macerated and slightly edematous with malodor present. The edema in her left foot is below her baseline, there is no erythema streaking. We will treat her with Santyl, doxycycline; we have ordered and xray, culture and provided a Peg assist surgical shoe and cultured the wound. 01/25/18-She is here in follow-up evaluation for a left great toe ulcer and presents with an abscess to her suprapubic area. She states her blood sugars remain elevated, feeling "sick" and if levels are below 250, but she is trying. She has made no attempt to decrease her smoking stating that we "can't take away her food in her cigarettes". She has been compliant with offloading using the PEG assist you. She is using Santyl daily. the culture obtained last week grew staph aureus and Enterococcus faecalis; continues on the doxycycline and Augmentin was added on Monday. The suprapubic area has erythema, no femoral variation, purple discoloration, minimal induration, was accessed with a cotton tip applicator with sanguinopurulent drainage, this was cultured, I suspect the current antibiotic treatment will cover and we will not add anything to her current treatment plan. She was advised to go to urgent care or ER with any change in redness, induration or fever. 02/01/18-She is here in follow-up evaluation for left great toe ulcers and a new abdominal abscess  from last week. She was able to use packing until earlier this week, where she "forgot it was there". She states she was feeling ill with GI symptoms last week and was not taking her antibiotic. She states her glucose levels have been predominantly less than 200, with occasional levels between 200-250. She thinks this was contributing to her GI symptoms as they have resolved without intervention. There continues to be significant laceration to left toe, otherwise it clinically looks stable/improved. There is now less superficial opening to the lateral aspect of the great toe that was residual blister. We will transition to [facility] to all wounds, she will continue her Augmentin. If there is no change or deterioration next week for reculture. 02/08/18-She is here in follow-up evaluation for left great toe ulcer and abdominal ulcer. There is an improvement in both wounds. She has been wrapping her left toe with coban, not by our direction, which has created an area of discoloration to the medial aspect; she has been advised to NOT use coban secondary to her neuropathy. She states her glucose levels have been high over this last week ranging from 200-350, she continues to smoke. She admits to being less compliant with her offloading shoe. We will continue with same treatment plan and she will follow-up next week. 02/15/18-She is here in follow-up evaluation for left great toe ulcer and abdominal ulcer. The abdominal ulcer is epithelialized. The left great toe ulcer is improved and all injury from last week using the Coban wrap is resolved, the lateral ulcer is healed. She admits to noncompliance with wearing offloading shoe and admits to glucose levels being greater than 300 most of the week. She continues to smoke and expresses no desire to quit. There is one area medially that probes deeper than it has historically, erythema to the toe and dorsal foot has consistently waxed and waned. There is no  overt signs of cellulitis or infection but we will culture the wound for any  occult infection given the new area of depth and erythema. We will hold off on sensitivities for initiation of antibiotic therapy. 02/22/18-She is here in follow up evaluation for left great toe ulcer. There is overall significant improvement in both wound appearance, erythema and edema with changes made last week. She was not initiated on antibiotic therapy. Culture obtained last week showed oxacillin sensitive staph aureus, sensitive to clindamycin. Clindamycin has been called into the pharmacy but she has been instructed to hold off on initiation secondary to overall clinical improvement and her history of antibiotic intolerance. She has been instructed to contact the clinic with any noted changes/deterioration and the wound, erythema, edema and/or pain. She will follow-up next week. She continues to smoke and her glucose levels remain elevated >250; she admits to compliance with offloading shoe 03/01/18 on evaluation today patient appears to be doing fairly well in regard to her left first toe ulcer. She has been tolerating the dressing changes with the [facility] Dressing without complication and overall this has definitely showed signs of improvement according to records as well is what the patient tells me today. I'm very pleased in that regard. She is having no pain today 03/08/18 She is here for follow up evaluation of a left great toe ulcer. She remains non-compliant with glucose control and smoking cessation; glucose levels consistently >200. She states that she got new shoe inserts/peg assist. She admits to compliance with offloading. Since my last evaluation there is significant improvement. We will switch to prisma at this time and she will follow up next week. She is noted to be tachycardic at this appointment, heart rate 120s; she has a history of heart rate 70-130 according to our records. She admits to extreme  agitation r/t personal issues; she was advised to monitor her heartrate and contact her physician if it does not return to a more normal range (<100). She takes cardizem twice daily. 03/15/18-She is here in follow-up evaluation for left great toe ulcer. She remains noncompliant with glucose control and smoking cessation. She admits to compliance with wearing offloading shoe. The ulcer is improved/stable and we will continue with the same treatment plan and she will follow-up next week 03/22/18-She is here for evaluation for left great toe ulcer. There continues to be significant improvement despite recurrent hyperglycemia (over 500 yesterday) and she continues to smoke. She has been compliant with offloading and we will continue with same treatment plan and she will follow-up next week. 03/29/18-She is here for evaluation for left great toe ulcer. Despite continuing to smoke and uncontrolled diabetes she continues to improve. She is compliant with offloading shoe. We will continue with the same treatment plan and she will follow-up next week 04/05/18- She is here in follow up evaluation for a left great toe ulcer; she presents with small pustule to left fifth toe (resembles ant bite). She admits to compliance with wearing offloading shoe; continues to smoke or have uncontrolled blood glucose control. There is more callus than usual with evidence of bleeding; she denies known trauma. 04/12/18-She is here for evaluation of left great toe ulcer. Despite noncompliance with glycemic control and smoking she continues to make [place], Amatullah A. ([id number]) improvement. She continues to wear offloading shoe. The pustule, that was identified last week, to the left fifth toe is resolved. She will follow-up in 2 weeks 05/03/18-she is seen in follow-up evaluation for a left great toe ulcer. She is compliant with offloading, otherwise noncompliant with glycemic control and smoking. She has  plateaued and there is  minimal improvement noted. We will transition to [facility], replaced the insert to her surgical shoe and she will follow-up in one week 05/10/18- She is here in follow up evaluation for a left great toe ulcer. It appears stable despite measurement change. We will continue with same treatment plan and follow up next week. 05/24/18-She is seen in follow-up evaluation for a left great toe ulcer. She remains compliant with offloading, has made significant improvement in her diet, decreasing the amount of sugar/soda. She said her recent A1c was 10.9 which is lower than. She did see a diabetic nutritionist/educator yesterday. She continues to smoke. We will continue with the same treatment plan and she'll follow-up next week. 05/31/18- She is seen in follow-up evaluation for left great toe ulcer. She continues to remain compliant with offloading, continues to make improvement in her diet, increasing her water and decreasing the amount of sugar/soda. She does continue to smoke with no desire to quit. We will apply Prisma to the depth and Hydrofera Blue over. We have not received insurance authorization for oasis. She will follow up next week. 06/07/18-She is seen in follow-up evaluation for left great toe ulcer. It has stalled according to today's measurements although base appears stable. She says she saw a diabetic educator yesterday; her average blood sugars are less than 300 which is an improvement for her. She continues to smoke and states "that's my next step" She continues with water over soda. We will order for xray, culture and reinstate ace wrap compression prior to placing apligraf for next week. She is voicing no complaints or concerns. Her dressing will change to iodoflex over the next week in preparation for apligraf. 06/14/18-She is seen in follow-up evaluation for left great toe ulcer. Plain film x-ray performed last week was negative for osteomyelitis. Wound culture obtained last week grew  strep B and OSSA; she is initiated on keflex and cefdinir today; there is erythema to the toe which could be from ace wrap compression, she has a history of wrapping too tight and has has been encouraged to maintain ace wraps that we place today. We will hold off on application of apligraf today, will apply next week after antibiotic therapy has been initiated. She admits today that she has resumed taking a shower with her foot/toe submerged in water, she has been reminded to keep foot/toe out of the bath water. She will be seen in follow up next week 06/21/18-she is seen in follow-up evaluation for left great toe ulcer. She is tolerating antibiotic therapy with no GI disturbance. The wound is stable. Apligraf was applied today. She has been decreasing her smoking, only had 4 cigarettes yesterday and 1 today. She continues being more compliant in diabetic diet. She will follow-up next week for evaluation of site, if stable will remove at 2 weeks. 06/28/18- She is here in follow up evalution. Apligraf was placed last week, she states the dressing fell off on Tuesday and she was dressing with hydrofera blue. She is healed and will be discharged from the clinic today. She has been instructed to continue with smoking cessation, continue monitoring glucose levels, offloading for an additional 4 weeks and continue with hydrofera blue for additional two weeks for any possible microscopic opening. Readmission: 08/07/18 on evaluation today patient presents for reevaluation concerning the ulcer of her right great toe. She was previously discharged on 06/28/18 healed. Nonetheless she states that this began to show signs of drainage she subsequently went to  her primary care provider. Subsequently an x-ray was performed on 08/01/18 which was negative. The patient was also placed on antibiotics at that time. Fortunately they should have been effective for the infection. Nonetheless she's been experiencing some  improvement but still has a lot of drainage coming from the wound itself. 08/14/18 on evaluation today patient's wound actually does show signs of improvement in regard to the erythema at this point. She has completed the antibiotics. With that being said we did discuss the possibility of placing her in a total contact cast as of today although I think that I may want to give this just a little bit more time to ensure nothing recurrence as far as her infection is concerned. I do not want to put in the cast and risk infection at that time if things are not completely resolved. With that being said she is gonna require some debridement today. 08/21/18 on evaluation today patient actually appears to be doing okay in regard to her toe ulcer. She's been tolerating the dressing changes without complication. With that being said it does appear that she is ready and in fact I think it's appropriate for [place] to go ahead and initiate the total contact cast today. Nonetheless she will require some sharp debridement to prepare the wound for application. Overall I feel like things have been progressing well but we do need to do something to get this to close more readily. 08/24/18 patient seen today for reevaluation after having had the total contact cast applied on Tuesday. She seems to have done very well the wound appears to be doing great and overall I'm pleased with the progress that she's made. There were no abnormal areas of rubbing from the cast on her lower extremity. 08/30/18 on evaluation today patient actually appears to be completely healed in regard to her plantar toe ulcer. She tells me at this point she's been having a lot of issues with the cast. She almost fell a couple of times the state shall the step of her dog a couple times as well. This is been a very frustrating process for her other nonetheless she has completely healed the wound which is excellent news. Overall there does not appear to be  the evidence of infection at this time which is great news. 09/11/18 evaluation today patient presents for follow-up concerning her great toe ulcer on the left which has unfortunately reopened since I last saw her which was only a couple of weeks ago. Unfortunately she was not able to get in to get the shoe and potentially the AFO that's gonna be necessary due to her left foot drop. She continues with offloading shoe but this is not enough to prevent her from reopening it appears. When we last had her in the total contact cast she did well from a healing standpoint but unfortunately the wound reopened as soon as she came out of the cast within just a couple of weeks. 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 This is also part of the reason why she falls on a regular basis. I just do not believe that is gonna be safe for [place] to reinitiate the total contact cast as last time we had this on she fell 3 times one week which is  definitely not normal for her. 09/18/18 upon evaluation today the patient actually appears to be doing about the same in regard to her toe ulcer. She did not contact [employer] as I asked her to even though I had given her the prescription. In fact she actually states that she has no idea where the prescription is. She did apparently call [employer] and they told her that all she needed to do was bring the prescription in order to be able to be seen and work on getting the AFO for her left foot. With all that being said she still does not have an appointment and I'm not sure were things stand that regard. I will give her a new prescription today in order to contact them to get this set up. 09/25/18 on evaluation today patient actually appears to be doing about the same in regard to her toes  ulcer. She does have a small areas which seems to have a lot of callous buildup around the edge of the wound which is going to need sharp debridement today. She still is waiting to be scheduled for evaluation with [employer] for possibility of an AFO. She states there supposed to call her tomorrow to get this set up. Unfortunately it does appear that her foot specifically the toe area is showing signs of erythema. There does not appear to be any systemic infection which is in these good news. [name], [name] ([id number]) 10/02/18 on evaluation today patient actually appears to be doing about the same in regard to her toe ulcer. This really has not done too well although it's not significantly larger it's also not significantly smaller. She has been tolerating the dressing changes without complication. She actually has her appointment with [employer] and [place] tomorrow to hopefully be measured for obtaining and AFO splint. I think this would be helpful preventing this from reoccurring. We had contemplated starting the cast this week although to be honest I am reluctant to do that as she's been having nausea, vomiting, and seizure activity over the past three days. She has a history of seizures and have been told is nothing that can be done for these. With that being said I do believe that along with the seizures have the nausea vomiting which upon further questioning doesn't seem to be the normal for her and makes me concerned for the possibility of infection or something else going on. I discussed this with the patient and her mother during the office visit today. I do not feel the wound is effective but maybe something else. The responses this was "this just happens to her at times and we don't know why". They did not seem to be interested in going to the hospital to have this checked out further. 10/09/18 on evaluation today patient presents for follow-up concerning her ongoing toe ulcer. She has  been tolerating the dressing changes without complication. Fortunately there does not appear to be any evidence of infection which is great news however I do think that the patient would benefit from going ahead for with the total contact cast. She's actually in a wheelchair today she tells me that she will use her walker if we initiate the cast. I was very specific about the fact that if we were gonna do the cast I wanted to make sure that she was using the walker in order to prevent any falls. She tells me she does not have stairs that she has to traverse on a regular basis at her home.  She has not had any seizures since last week again that something that happens to her often she tells me she did talk to [employer] and they said that it may take up to three weeks to get the brace approved for her. Hopefully that will not take that long but nonetheless in the meantime I do think the cast could be of benefit. 10/12/18 on evaluation today patient appears to be doing rather well in regard to her toe ulcer. It's just been a few days and already this is significantly improved both as far as overall appearance and size. Fortunately there's no sign of infection. She is here for her first obligatory cast change. 10/19/18 Seen today for follow up and management of left great toe ulcer. Wound continues to show improvement. Noted small open area with seroussang drainage with palpation. Denies any increased pain or recent fevers during visit. She will continue calcium alginate with offloading shoe. Denies any questions or concerns during visit. 10/26/18 on evaluation today patient appears to be doing about the same as when I last saw her in regard to her wound bed. Fortunately there does not appear to be any signs of infection. Unfortunately she continues to have a breakdown in regard to the toe region any time that she is not in the cast. It takes almost no time at all for this to happen. Nonetheless she still has  not heard anything from the brace being made by [employer] as to when exactly this will be available to her. Fortunately there is no signs of infection at this time. 10/30/18 on evaluation today patient presents for application of the total contact cast as we just received him this morning. Fortunately we are gonna be able to apply this to her today which is great news. She continues to have no significant pain which is good news. Overall I do feel like things have been improving while she was the cast is when she doesn't have a cast that things get worse. She still has not really heard anything from [place] regarding her brace. 11/02/18 upon evaluation today patient's wound already appears to be doing significantly better which is good news. Fortunately there does not appear to be any signs of infection also good news. Overall I do think the total contact cast as before is helping to heal this area unfortunately it's just not gonna likely keep the area closed and healed without her getting her brace at least. Again the foot drop is a significant issue for her. 11/09/18 on evaluation today patient appears to be doing excellent in regard to her toe ulcer which in fact is completely healed. Fortunately we finally got the situation squared away with the paperwork which was needed to proceed with getting her brace approved by Medicaid. I have filled that out unfortunately that information has been sent to the orthopedic office that I worked at 2 1/2 years ago and not tired Current wound care measures. Fortunately she seems to be doing very well at this time. 11/23/18 on evaluation today patient appears to be doing More Poorly Compared to Last Time I Saw Her. At [facility] She Had Completely Healed. Currently she is continuing to have issues with reopening. She states that she just found out that the brace was approved through Medicaid now she just has to go get measured in order to have this fitted for her and  then made. Subsequently she does not have an appointment for this yet that is going to complicate things  we obviously cannot put her back in the cast if we do not have everything measured because they're not gonna be able to measure her foot while she is in the cast. 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 Obviously this is unfortunate and does have me somewhat worried as well. 11/30/18 on evaluation today patient's toe ulcer actually appears to be doing fairly well. The good news is she will be getting her brace in the shoes next week on Wednesday. Hopefully we will be able to get this to heal without having to go back in the cast however she may need the cast in order to get the wound completely heal and then go from there. Fortunately there's no signs of infection at this time. 12/07/18 on evaluation today patient fortunately did receive her brace and she states she could tell this definitely makes her walk better. With that being said she's been having issues with her toe where she noticed yesterday there was a lot of tissue that was loosing off this appears to be much larger than what it was previous. She also states that her leg has been read putting much across the top of her foot just about the ankle although this seems to be receiving somewhat. The total area is still red and appears to be someone infected as best I can tell. She is previously taken Bactrim and that may be a good option for her today as well. We are gonna see what I wound culture shows as well and I think that this is definitely appropriate. With that being said outside of the culture I still need to initiate something in the interim and that's what I'm gonna go ahead and select Bactrim is a good option for her. 12/14/18 on evaluation today patient appears to be doing better in regard to her left great toe ulcer as compared to  last week's evaluation. There's still some erythema although this is significantly improved which is excellent news. Overall I do believe that she is making good progress is still gonna take some time before she is where I would like her to be from the standpoint of being able to place her back into the total contact cast. Hopefully we will be where we need to be by next week. 12/21/18 on evaluation today patient actually appears to be doing poorly in regard to her toe ulcer. She's been tolerating the dressing changes without complication. Fortunately there's no signs of systemic infection although she does have a lot of drainage from the toe ulcer and this does seem to be causing some issues at this point. She does have erythema on the distal portion of her toe that appears to be likely cellulitis. 12/28/18 on evaluation today patient actually appears to be doing a little better in my pinion in regard to her toe ulcer. With that being said she still does have some evidence of infection at this time and for her culture she had both E. coli as well as enterococcus as organisms noted on evaluation. For that reason I think that though the Keflex likely has treated the E. coli rather well this has really done nothing for the enterococcus. We are going to have to initiate treatment for this specifically. ZAVANNAH, KEMMERER A. ([id number]) 01/04/19 on evaluation today patient's toe actually appears to be doing better from the standpoint of infection. She currently would like  to see about putting the cash back on I think that this is appropriate as long as she takes care of it and keeps it from getting wet. She is gonna have some drainage we can definitely pass this up with Drawtex and alginate to try to prevent as much drainage as possible from causing the problems. With that being said I do want to at least try her with the cast between now and Tuesday. If there any issues we can't continue to use it then I will  discontinue the use of the cast at that point. 01/08/19 on evaluation today patient actually appears to be doing very well as far as her foot ulcer specifically the great toe on the left is concerned. She did have an area of rubbing on the medial aspect of her left ankle which again is from the cast. Fortunately there's no signs of infection at this point in this appears to be a very slight skin breakdown. The patient tells me she felt it rubbing but didn't think it was that bad. Fortunately there is no signs of active infection at this time which is good news. No fevers, chills, nausea, or vomiting noted at this time. 01/15/19 on evaluation today patient actually appears to be doing well in regard to her toe ulcer. Again as previous she seems to do well and she has the cast on which indicates to me that during the time she doesn't have a cast on she's putting way too much pressure on this region. Obviously I think that's gonna be an issue as with the current national emergency concerning the Covid-19 Virus it has been recommended that we discontinue the use of total contact casting by the chief medical officer of our company, Dr. Simona Huh. The reasoning is that if a patient becomes sick and cannot come into have the cast removed they could not just leave this on for an additional two weeks. Obviously the hospitals also do not want to receive patient's who are sick into the emergency department to potentially contaminate the region and spread the Covid-19 Virus among other sick individuals within the hospital system. Therefore at this point we are suspending the use of total contact cast until the current emergency subsides. This was all discussed with the patient today as well. 01/22/19 on evaluation today patient's wound on her left great toe appears to be doing slightly worse than previously noted last week. She tells me that she has been on this quite a bit in fact she tells me she's been awake for 38  straight hours. This is due to the fact that she's having to care for grandparents because nobody else will. She has been taking care of them for five the last seven days since I've seen her they both have dementia his is from a stroke and her grandmother's was progressive. Nonetheless she states even her mom who knows her condition and situation has only help two of those days to take care of them she's been taking care of the rest. Fortunately there does not appear to be any signs of active infection in regard to her toe at this point although obviously it doesn't look as good as it did previous. I think this is directly related to her not taking off the pressure and friction by way of taking things easy. Though I completely understand what's going on. 01/29/19 on evaluation today patient's tools are actually appears to be showing some signs of improvement today compared to last week's evaluation  as far as not necessarily the overall size of the wound but the fact that she has some new skin growth in between the two ends of the wound opening. Overall I feel like she has done well she states that she had a family member give her what sounds to be a CAM walker boot which has been helpful as well. 02/05/19 on evaluation today patient's wound bed actually appears to be doing significantly better in regard to her overall appearance of the size of the wound. With that being said she is still having an issue with offloading efficiently enough to get this to close. Apparently there is some signs of infection at this point as well unfortunately. Previously she's done well of Augmentin I really do not see anything that needs to be culture currently but there theme and cellulitis of the foot that I'm seeing I'm gonna go ahead and place her on an antibiotic today to try to help clear this up. 02/12/2019 on evaluation today patient actually appears to be doing poorly in regard to her overall wound status. She tells  me she has been using her offloading shoe but actually comes in today wearing her tennis shoe with the AFO brace. Again as I previously discussed with her this is really not sufficient to allow the area to heal appropriately. Nonetheless she continues to be somewhat noncompliant and I do wonder based on what she has told my nurse in the past as to whether or not a good portion of this noncompliance may be recreational drug and alcohol related. She has had a history of heroin overdose and this was fairly recently in the past couple of months that have been seeing her. Nonetheless overall I feel like her wound looks significantly worse today compared to what it was previous. She still has significant erythema despite the Augmentin I am not sure that this is an appropriate medication for her infection I am also concerned that the infection may have gone down into her bone. 02/19/19 on evaluation today patient actually appears to be doing about the same in regard to her toe ulcer. Unfortunately she continues to show signs of bone exposure and infection at this point. There does not appear to be any evidence of worsening of the infection but I'm also not really sure that it's getting significantly better. She is on the Augmentin which should be sufficient for the Staphylococcus aureus infection that she has at this point. With that being said she may need IV antibiotics to more appropriately treat this. We did have a discussion today about hyperbaric option therapy. 02/28/19 on evaluation today patient actually appears to be doing much worse in regard to the wound on her left great toe as compared to even my previous evaluation last week. Unfortunately this seems to be training in a pretty poor direction. Her toe was actually now starting to angle laterally and I can actually see the entire joint area of the proximal portion of the digit where is the distal portion of the digit again is no longer even in  contact with the joint line. Unfortunately there's a lot more necrotic tissue around the edge and the toe appears to be showing signs of becoming gangrenous in my pinion. I'm very concerned about were things stand at this point. She did see infectious disease and they are planning to send in a prescription for Sivextro for her and apparently this has been approved. With that being said I don't think she should avoid  taking this but at the same time I'm not sure that it's gonna be sufficient to save her toe at this point. 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 03/05/19 on evaluation today patient actually appears to be doing quite well in regard to her toe all things considering. She still has bone exposed but there appears to be much less your thing on overall the appearance of the wound and the toe itself is dramatically improved. She still does have some issues currently obviously with infection she did see vascular as well and there concerned that her blood flow to the toad. For that reason they are setting up for an angiogram next week. 03/14/19 on evaluation today patient appears to be doing very poor in regard to her toe and specifically in regard to the ulceration and the fact that she's starting to notice the toe was leaning even more towards the lateral aspect and the complete joint is visible on the proximal aspect of the joint. Nonetheless she's also noted a significant odor and the tip of the toe is turning more dark and necrotic appearing. Overall I think she is getting worse not better as far as this is concerned. For that reason I am recommending at this point that she likely needs to be seen for likely amputation. READMISSION 03/19/2021 This is a patient that we cared for in this  clinic for a prolonged period of time in 2019 and 2020 with a left foot and left first toe wound. 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 ([id number]) 04/09/20 by Dr. Luana Shu. In December 2021 she had an ulcer on her right great toe as well as the fourth and fifth toes. 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 She also had an angiogram at that time and underwent angioplasty of the right anterior tibial artery. In any case she claims that the wound on the right foot is closed I did not look at this today which was probably an oversight although I think that should be done next week. After her surgery she developed a dehiscence but I do not see any follow-up of this. According to Dr. Deborra Medina last review that she was out of the area being cared for by another physician but recently came back to his attention. 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 A culture of this area showed E. coli apparently before she came back to see Dr. Luana Shu she was supposed to be receiving antibiotics but she did not really take them. Nor is she offloading this area at all. Finally her last hemoglobin A1c listed in epic was in March 2022 at 14.1 she says things are a lot better since then although I am not sure. 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 She had this wound at the time. During this admission she had E. coli sepsis a TEE was negative. Past medical history is extensive and includes type 2 diabetes with peripheral neuropathy cardiomyopathy with an ejection fraction of 33%, hypertension, hyperlipidemia chronic renal failure stage III history of substance abuse with  cocaine although she claims to be clean now verified by her mother. She is still a heavy  cigarette smoker. She has a history of bipolar disorder seizure disorder ABI in our clinic was 1.05 6/1; left midfoot in the setting of a TMA done previously. Round circular wound with a "knuckle" of protruding tissue. The problem is that the knuckle was not attached to any of the surrounding granulation and this probed proximally widely I removed a large portion of this tissue. This wound goes with considerable undermining laterally. I do not feel any bone there was no purulence but this is a deep wound. 6/8; in spite of the debridement I did last week. She arrives with a wound looking exactly the same. A protruding "knuckle" of tissue nonadherent to most of the surrounding tissue. There is considerable depth around this from 6-12 o'clock at 2.7 cm and undermining of 1 cm. This does not look overtly infected and the x-ray I did last week was negative for any osseous abnormalities. We have been using silver collagen 6/15; deep tissue culture I did last week showed moderate staph aureus and moderate Pseudomonas. This will definitely require prolonged antibiotic therapy. The pathology on the protuberant area was negative for malignancy fungus etc. 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 We have been using silver collagen. I am going to be prescribing Levaquin for 2 weeks. Her CT scan of the foot is down for 7/5 6/22; CT scan of the foot on 7 5. She says she has hardware in the left leg from her previous fracture. She is on the Levaquin for the deep tissue culture I did that showed methicillin sensitive staph aureus and Pseudomonas. I gave her a 2-week supply and she will have another week. She arrives in clinic today with the same protuberant tissue however this is nonadherent to the tissue surrounding it. I am really at a loss to explain this unless there is underlying deep tissue infection 6/29; patient presents for 1 week follow-up. She  has been using collagen to the wound bed. She reports taking her antibiotics as prescribed.She has no complaints or issues today. She denies signs of infection. 7/6; patient presents for one week followup. She has been using collagen to the wound bed. She states she is taking Levaquin however at times she is not able to keep it down. She denies signs of infection. 7/13; patient presents for 1 week follow-up. She has been using silver alginate to the wound bed. She still has nausea when taking her antibiotics. She denies signs of infection. 7/20; patient presents for 1 week follow-up. She has been using silver alginate with gentamicin cream to the wound bed. She denies any issues and has no complaints today. She denies signs of infection. 7/27; patient presents for 1 week follow-up. She continues to use silver alginate with gentamicin cream to the wound bed. She reports starting her antibiotics. She has no issues or complaints. Overall she reports stability to the wound. 8/3; patient presents for 1 week follow-up. She has been using silver alginate with gentamicin cream to the wound bed. She reports completing all antibiotics. She has no issues or complaints today. She denies signs of infection. 8/17; patient presents for 2-week follow-up. He is to use silver alginate to the wound bed. She has no issues or complaints today. She denies signs of infection. She reports her pain has improved in her foot since last clinic visit 8/24; patient presents for 1  week follow-up. She continues to use silver alginate to the wound bed. She has no issues or complaints. She denies signs of infection. Pain is stable. Electronic Signature(s) Signed: 06/16/2021 11:20:57 AM By: Kalman Shan DO Entered By: Kalman Shan on 06/16/2021 11:17:33 [name] ([id number]) -------------------------------------------------------------------------------- Physical Exam Details Patient Name: Heather Dear A. Date  of Service: 06/16/2021 10:45 AM Medical Record Number: [id number] Patient Account Number: [id number] Date of Birth/Sex: 1976-06-10 (45 y.o. F) Treating RN: Dolan Amen Primary Care Provider: Raelene Bott Other Clinician: Referring Provider: Raelene Bott Treating Provider/Extender: Yaakov Guthrie in Treatment: 12 Constitutional . Psychiatric . Notes Left foot (Transmetatarsal amputation): Wound to the plantar aspect that that has granulation tissue and nonviable tissue present. There is significant callus circumferentially. Electronic Signature(s) Signed: 06/16/2021 11:20:57 AM By: Kalman Shan DO Entered By: Kalman Shan on 06/16/2021 11:18:16 [name] ([id number]) -------------------------------------------------------------------------------- Physician Orders Details Patient Name: Heather Dear A. Date of Service: 06/16/2021 10:45 AM Medical Record Number: [id number] Patient Account Number: [id number] Date of Birth/Sex: 1975-10-29 (45 y.o. F) Treating RN: Dolan Amen Primary Care Provider: Raelene Bott Other Clinician: Referring Provider: Raelene Bott Treating Provider/Extender: Yaakov Guthrie in Treatment: 12 Verbal / Phone Orders: No Diagnosis Coding Follow-up Appointments o Return Appointment in 1 week. Bathing/ Shower/ Hygiene o Clean wound with Normal Saline or wound cleanser. Off-Loading Left Lower Extremity o Open toe surgical shoe with peg assist. o Turn and reposition every 2 hours Wound Treatment Wound #7 - Foot Wound Laterality: Plantar, Left, Midline Cleanser: Soap and Water 3 x Per Week/30 Days Discharge Instructions: Gently cleanse wound with antibacterial soap, rinse and pat dry prior to dressing wounds Peri-Wound Care: Desitin Maximum Strength Ointment 4 (oz) 3 x Per Week/30 Days Discharge Instructions: Peri wound Topical: Gentamicin 3 x Per Week/30 Days Discharge Instructions: Apply to wound bed and  tunnel area under the dressing Primary Dressing: Silvercel Small 2x2 (in/in) 3 x Per Week/30 Days Discharge Instructions: Apply to wound bed Secondary Dressing: ABD Pad 5x9 (in/in) 3 x Per Week/30 Days Discharge Instructions: Cover with ABD pad on top of dressing Secondary Dressing: Gauze 3 x Per Week/30 Days Discharge Instructions: To hold dressing in place Secured With: Kerlix Roll Sterile or Non-Sterile 6-ply 4.5x4 (yd/yd) 3 x Per Week/30 Days Discharge Instructions: Apply Kerlix as directed Electronic Signature(s) Signed: 06/16/2021 11:20:57 AM By: Kalman Shan DO Signed: 06/16/2021 1:41:52 PM By: Dolan Amen RN Entered By: Dolan Amen on 06/16/2021 11:12:34 [name], [name] ([id number]) -------------------------------------------------------------------------------- Problem List Details Patient Name: Heather Dear A. Date of Service: 06/16/2021 10:45 AM Medical Record Number: [id number] Patient Account Number: [id number] Date of Birth/Sex: 1975-12-15 (45 y.o. 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 ([id number]) -------------------------------------------------------------------------------- Progress Note Details Patient Name: Heather Dear A. Date of Service: 06/16/2021 10:45 AM Medical Record Number:  [id number] Patient Account Number: [id number] Date of Birth/Sex: Sep 12, 1976 (45 y.o. F) Treating RN: Dolan Amen Primary Care Provider: Raelene Bott Other Clinician: Referring Provider: Raelene Bott  Treating Provider/Extender: Yaakov Guthrie in Treatment: 12 Subjective Chief Complaint Information obtained from Patient Left great toe ulcer 03/19/2021; patient referred by Dr. Luana Shu who has been looking after her left foot for quite a period of time for review of a nonhealing area in the left midfoot History of Present Illness (HPI) 01/18/18-She is here for initial evaluation of the left great toe ulcer. She is a poor historian in regards to timeframe in detail. She states approximately 4 weeks ago she lacerated her toe on something in the house. She followed up with her primary care who placed her on Bactrim and ultimately a second dose of Bactrim prior to coming to wound clinic. She states she has been treating the toe with peroxide, Betadine and a Band-Aid. She did not check her blood sugar this morning but checked it yesterday morning it was 327; she is unaware of a recent A1c and there are no current records. She saw Dr. she would've orthopedics last week for an old injury to the left ankle, she states he did not see her toe, nor did she bring it to his attention. She smokes approximately 1 pack cigarettes a day. Her social situation is concerning, she arrives this morning with her mother who appears extremely intoxicated/under the influence; her mother was asked to leave the room and be monitored by the patient's grandmother. The patient's aunt then accompanied the patient and the room throughout the rest of the appointment. We had a lengthy discussion regarding the deleterious effects of uncontrolled hyperglycemia and smoking as it relates to wound healing and overall health. She was strongly encouraged to decrease her smoking and get her diabetes under better control. She states  she is currently on a diet and has cut down her [facility] consumption. The left toe is erythematous, macerated and slightly edematous with malodor present. The edema in her left foot is below her baseline, there is no erythema streaking. We will treat her with Santyl, doxycycline; we have ordered and xray, culture and provided a Peg assist surgical shoe and cultured the wound. 01/25/18-She is here in follow-up evaluation for a left great toe ulcer and presents with an abscess to her suprapubic area. She states her blood sugars remain elevated, feeling "sick" and if levels are below 250, but she is trying. She has made no attempt to decrease her smoking stating that we "can't take away her food in her cigarettes". She has been compliant with offloading using the PEG assist you. She is using Santyl daily. the culture obtained last week grew staph aureus and Enterococcus faecalis; continues on the doxycycline and Augmentin was added on Monday. The suprapubic area has erythema, no femoral variation, purple discoloration, minimal induration, was accessed with a cotton tip applicator with sanguinopurulent drainage, this was cultured, I suspect the current antibiotic treatment will cover and we will not add anything to her current treatment plan. She was advised to go to urgent care or ER with any change in redness, induration or fever. 02/01/18-She is here in follow-up evaluation for left great toe ulcers and a new abdominal abscess from last week. She was able to use packing until earlier this week, where she "forgot it was there". She states she was feeling ill with GI symptoms last week and was not taking her antibiotic. She states her glucose levels have been predominantly less than 200, with occasional levels between 200-250. She thinks this was contributing to her GI symptoms as they have resolved without intervention. There continues  to be significant laceration to left toe, otherwise it clinically  looks stable/improved. There is now less superficial opening to the lateral aspect of the great toe that was residual blister. We will transition to [facility] to all wounds, she will continue her Augmentin. If there is no change or deterioration next week for reculture. 02/08/18-She is here in follow-up evaluation for left great toe ulcer and abdominal ulcer. There is an improvement in both wounds. She has been wrapping her left toe with coban, not by our direction, which has created an area of discoloration to the medial aspect; she has been advised to NOT use coban secondary to her neuropathy. She states her glucose levels have been high over this last week ranging from 200-350, she continues to smoke. She admits to being less compliant with her offloading shoe. We will continue with same treatment plan and she will follow-up next week. 02/15/18-She is here in follow-up evaluation for left great toe ulcer and abdominal ulcer. The abdominal ulcer is epithelialized. The left great toe ulcer is improved and all injury from last week using the Coban wrap is resolved, the lateral ulcer is healed. She admits to noncompliance with wearing offloading shoe and admits to glucose levels being greater than 300 most of the week. She continues to smoke and expresses no desire to quit. There is one area medially that probes deeper than it has historically, erythema to the toe and dorsal foot has consistently waxed and waned. There is no overt signs of cellulitis or infection but we will culture the wound for any occult infection given the new area of depth and erythema. We will hold off on sensitivities for initiation of antibiotic therapy. 02/22/18-She is here in follow up evaluation for left great toe ulcer. There is overall significant improvement in both wound appearance, erythema and edema with changes made last week. She was not initiated on antibiotic therapy. Culture obtained last week showed oxacillin  sensitive staph aureus, sensitive to clindamycin. Clindamycin has been called into the pharmacy but she has been instructed to hold off on initiation secondary to overall clinical improvement and her history of antibiotic intolerance. She has been instructed to contact the clinic with any noted changes/deterioration and the wound, erythema, edema and/or pain. She will follow-up next week. She continues to smoke and her glucose levels remain elevated >250; she admits to compliance with offloading shoe 03/01/18 on evaluation today patient appears to be doing fairly well in regard to her left first toe ulcer. She has been tolerating the dressing changes with the [facility] Dressing without complication and overall this has definitely showed signs of improvement according to records as well is what the patient tells me today. I'm very pleased in that regard. She is having no pain today 03/08/18 She is here for follow up evaluation of a left great toe ulcer. She remains non-compliant with glucose control and smoking cessation; glucose levels consistently >200. She states that she got new shoe inserts/peg assist. She admits to compliance with offloading. Since my last evaluation there is significant improvement. We will switch to prisma at this time and she will follow up next week. She is noted to be tachycardic at this appointment, heart rate 120s; she has a history of heart rate 70-130 according to our records. She admits to extreme agitation r/t personal issues; she was advised to monitor her heartrate and contact her physician if it does not return to a more normal range (<100). She takes cardizem twice daily.  03/15/18-She is here in follow-up evaluation for left great toe ulcer. She remains noncompliant with glucose control and smoking cessation. She admits to compliance with wearing offloading shoe. The ulcer is improved/stable and we will continue with the same treatment plan and she will follow-up  next week 03/22/18-She is here for evaluation for left great toe ulcer. There continues to be significant improvement despite recurrent hyperglycemia (over 500 yesterday) and she continues to smoke. She has been compliant with offloading and we will continue with same treatment plan and she will follow-up next week. [name], [name] ([id number]) 03/29/18-She is here for evaluation for left great toe ulcer. Despite continuing to smoke and uncontrolled diabetes she continues to improve. She is compliant with offloading shoe. We will continue with the same treatment plan and she will follow-up next week 04/05/18- She is here in follow up evaluation for a left great toe ulcer; she presents with small pustule to left fifth toe (resembles ant bite). She admits to compliance with wearing offloading shoe; continues to smoke or have uncontrolled blood glucose control. There is more callus than usual with evidence of bleeding; she denies known trauma. 04/12/18-She is here for evaluation of left great toe ulcer. Despite noncompliance with glycemic control and smoking she continues to make improvement. She continues to wear offloading shoe. The pustule, that was identified last week, to the left fifth toe is resolved. She will follow-up in 2 weeks 05/03/18-she is seen in follow-up evaluation for a left great toe ulcer. She is compliant with offloading, otherwise noncompliant with glycemic control and smoking. She has plateaued and there is minimal improvement noted. We will transition to [facility], replaced the insert to her surgical shoe and she will follow-up in one week 05/10/18- She is here in follow up evaluation for a left great toe ulcer. It appears stable despite measurement change. We will continue with same treatment plan and follow up next week. 05/24/18-She is seen in follow-up evaluation for a left great toe ulcer. She remains compliant with offloading, has made significant improvement in her diet,  decreasing the amount of sugar/soda. She said her recent A1c was 10.9 which is lower than. She did see a diabetic nutritionist/educator yesterday. She continues to smoke. We will continue with the same treatment plan and she'll follow-up next week. 05/31/18- She is seen in follow-up evaluation for left great toe ulcer. She continues to remain compliant with offloading, continues to make improvement in her diet, increasing her water and decreasing the amount of sugar/soda. She does continue to smoke with no desire to quit. We will apply Prisma to the depth and Hydrofera Blue over. We have not received insurance authorization for oasis. She will follow up next week. 06/07/18-She is seen in follow-up evaluation for left great toe ulcer. It has stalled according to today's measurements although base appears stable. She says she saw a diabetic educator yesterday; her average blood sugars are less than 300 which is an improvement for her. She continues to smoke and states "that's my next step" She continues with water over soda. We will order for xray, culture and reinstate ace wrap compression prior to placing apligraf for next week. She is voicing no complaints or concerns. Her dressing will change to iodoflex over the next week in preparation for apligraf. 06/14/18-She is seen in follow-up evaluation for left great toe ulcer. Plain film x-ray performed last week was negative for osteomyelitis. Wound culture obtained last week grew strep B and OSSA; she is initiated on keflex  and cefdinir today; there is erythema to the toe which could be from ace wrap compression, she has a history of wrapping too tight and has has been encouraged to maintain ace wraps that we place today. We will hold off on application of apligraf today, will apply next week after antibiotic therapy has been initiated. She admits today that she has resumed taking a shower with her foot/toe submerged in water, she has been reminded to keep  foot/toe out of the bath water. She will be seen in follow up next week 06/21/18-she is seen in follow-up evaluation for left great toe ulcer. She is tolerating antibiotic therapy with no GI disturbance. The wound is stable. Apligraf was applied today. She has been decreasing her smoking, only had 4 cigarettes yesterday and 1 today. She continues being more compliant in diabetic diet. She will follow-up next week for evaluation of site, if stable will remove at 2 weeks. 06/28/18- She is here in follow up evalution. Apligraf was placed last week, she states the dressing fell off on Tuesday and she was dressing with hydrofera blue. She is healed and will be discharged from the clinic today. She has been instructed to continue with smoking cessation, continue monitoring glucose levels, offloading for an additional 4 weeks and continue with hydrofera blue for additional two weeks for any possible microscopic opening. Readmission: 08/07/18 on evaluation today patient presents for reevaluation concerning the ulcer of her right great toe. She was previously discharged on 06/28/18 healed. Nonetheless she states that this began to show signs of drainage she subsequently went to her primary care provider. Subsequently an x-ray was performed on 08/01/18 which was negative. The patient was also placed on antibiotics at that time. Fortunately they should have been effective for the infection. Nonetheless she's been experiencing some improvement but still has a lot of drainage coming from the wound itself. 08/14/18 on evaluation today patient's wound actually does show signs of improvement in regard to the erythema at this point. She has completed the antibiotics. With that being said we did discuss the possibility of placing her in a total contact cast as of today although I think that I may want to give this just a little bit more time to ensure nothing recurrence as far as her infection is concerned. I do not want  to put in the cast and risk infection at that time if things are not completely resolved. With that being said she is gonna require some debridement today. 08/21/18 on evaluation today patient actually appears to be doing okay in regard to her toe ulcer. She's been tolerating the dressing changes without complication. With that being said it does appear that she is ready and in fact I think it's appropriate for [place] to go ahead and initiate the total contact cast today. Nonetheless she will require some sharp debridement to prepare the wound for application. Overall I feel like things have been progressing well but we do need to do something to get this to close more readily. 08/24/18 patient seen today for reevaluation after having had the total contact cast applied on Tuesday. She seems to have done very well the wound appears to be doing great and overall I'm pleased with the progress that she's made. There were no abnormal areas of rubbing from the cast on her lower extremity. 08/30/18 on evaluation today patient actually appears to be completely healed in regard to her plantar toe ulcer. She tells me at this point she's been having a  lot of issues with the cast. She almost fell a couple of times the state shall the step of her dog a couple times as well. This is been a very frustrating process for her other nonetheless she has completely healed the wound which is excellent news. Overall there does not appear to be the evidence of infection at this time which is great news. 09/11/18 evaluation today patient presents for follow-up concerning her great toe ulcer on the left which has unfortunately reopened since I last saw her which was only a couple of weeks ago. Unfortunately she was not able to get in to get the shoe and potentially the AFO that's gonna be necessary due to her left foot drop. She continues with offloading shoe but this is not enough to prevent her from reopening it appears. When  we last had her in the total contact cast she did well from a healing standpoint but unfortunately the wound reopened as soon as she came out of the cast within just a couple of weeks. 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 This is also part of the reason why she falls on a regular basis. I just do not believe that is gonna be safe for [place] to reinitiate the total contact cast as last time we had this on she fell 3 times one week which is definitely not normal for her. 09/18/18 upon evaluation today the patient actually appears to be doing about the same in regard to her toe ulcer. She did not contact [employer] as I asked her to even though I had given her the prescription. In fact she actually states that she has no idea where the prescription is. She did apparently call [employer] and they told her that all she needed to do was bring the prescription in order to be able to be seen and work on getting the AFO for her left foot. With all that being said she still does not have an appointment and I'm not sure were things stand that regard. I will give her a new prescription today in order to contact them to get this set up. [name], [name] ([id number]) 09/25/18 on evaluation today patient actually appears to be doing about the same in regard to her toes ulcer. She does have a small areas which seems to have a lot of callous buildup around the edge of the wound which is going to need sharp debridement today. She still is waiting to be scheduled for evaluation with [employer] for possibility of an AFO. She states there supposed to call her tomorrow to get this set up. Unfortunately it does appear that her foot specifically the toe area is showing signs of erythema. There does not appear to be any systemic infection  which is in these good news. 10/02/18 on evaluation today patient actually appears to be doing about the same in regard to her toe ulcer. This really has not done too well although it's not significantly larger it's also not significantly smaller. She has been tolerating the dressing changes without complication. She actually has her appointment with [facility] and [facility] tomorrow to hopefully be measured for obtaining and AFO splint. I think this would be helpful preventing this from reoccurring. We had contemplated starting the cast this week although  to be honest I am reluctant to do that as she's been having nausea, vomiting, and seizure activity over the past three days. She has a history of seizures and have been told is nothing that can be done for these. With that being said I do believe that along with the seizures have the nausea vomiting which upon further questioning doesn't seem to be the normal for her and makes me concerned for the possibility of infection or something else going on. I discussed this with the patient and her mother during the office visit today. I do not feel the wound is effective but maybe something else. The responses this was "this just happens to her at times and we don't know why". They did not seem to be interested in going to the hospital to have this checked out further. 10/09/18 on evaluation today patient presents for follow-up concerning her ongoing toe ulcer. She has been tolerating the dressing changes without complication. Fortunately there does not appear to be any evidence of infection which is great news however I do think that the patient would benefit from going ahead for with the total contact cast. She's actually in a wheelchair today she tells me that she will use her walker if we initiate the cast. I was very specific about the fact that if we were gonna do the cast I wanted to make sure that she was using the walker in order to prevent any  falls. She tells me she does not have stairs that she has to traverse on a regular basis at her home. She has not had any seizures since last week again that something that happens to her often she tells me she did talk to [employer] and they said that it may take up to three weeks to get the brace approved for her. Hopefully that will not take that long but nonetheless in the meantime I do think the cast could be of benefit. 10/12/18 on evaluation today patient appears to be doing rather well in regard to her toe ulcer. It's just been a few days and already this is significantly improved both as far as overall appearance and size. Fortunately there's no sign of infection. She is here for her first obligatory cast change. 10/19/18 Seen today for follow up and management of left great toe ulcer. Wound continues to show improvement. Noted small open area with seroussang drainage with palpation. Denies any increased pain or recent fevers during visit. She will continue calcium alginate with offloading shoe. Denies any questions or concerns during visit. 10/26/18 on evaluation today patient appears to be doing about the same as when I last saw her in regard to her wound bed. Fortunately there does not appear to be any signs of infection. Unfortunately she continues to have a breakdown in regard to the toe region any time that she is not in the cast. It takes almost no time at all for this to happen. Nonetheless she still has not heard anything from the brace being made by [employer] as to when exactly this will be available to her. Fortunately there is no signs of infection at this time. 10/30/18 on evaluation today patient presents for application of the total contact cast as we just received him this morning. Fortunately we are gonna be able to apply this to her today which is great news. She continues to have no significant pain which is good news. Overall I do feel like things have been improving while she was  the cast is when she doesn't have a cast that things get worse. She still has not really heard anything from [facility] regarding her brace. 11/02/18 upon evaluation today patient's wound already appears to be doing significantly better which is good news. Fortunately there does not appear to be any signs of infection also good news. Overall I do think the total contact cast as before is helping to heal this area unfortunately it's just not gonna likely keep the area closed and healed without her getting her brace at least. Again the foot drop is a significant issue for her. 11/09/18 on evaluation today patient appears to be doing excellent in regard to her toe ulcer which in fact is completely healed. Fortunately we finally got the situation squared away with the paperwork which was needed to proceed with getting her brace approved by Medicaid. I have filled that out unfortunately that information has been sent to the orthopedic office that I worked at 2 1/2 years ago and not tired Current wound care measures. Fortunately she seems to be doing very well at this time. 11/23/18 on evaluation today patient appears to be doing More Poorly Compared to Last Time I Saw Her. At [facility] She Had Completely Healed. Currently she is continuing to have issues with reopening. She states that she just found out that the brace was approved through Medicaid now she just has to go get measured in order to have this fitted for her and then made. Subsequently she does not have an appointment for this yet that is going to complicate things we obviously cannot put her back in the cast if we do not have everything measured because they're not gonna be able to measure her foot while she is in the cast. 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 Obviously this is unfortunate and does have me somewhat worried as well. 11/30/18 on  evaluation today patient's toe ulcer actually appears to be doing fairly well. The good news is she will be getting her brace in the shoes next week on Wednesday. Hopefully we will be able to get this to heal without having to go back in the cast however she may need the cast in order to get the wound completely heal and then go from there. Fortunately there's no signs of infection at this time. 12/07/18 on evaluation today patient fortunately did receive her brace and she states she could tell this definitely makes her walk better. With that being said she's been having issues with her toe where she noticed yesterday there was a lot of tissue that was loosing off this appears to be much larger than what it was previous. She also states that her leg has been read putting much across the top of her foot just about the ankle although this seems to be receiving somewhat. The total area is still red and appears to be someone infected as best I can tell. She is previously taken Bactrim and that may be a good option for her today as well. We are gonna see what I wound culture shows as well and I think that this is definitely appropriate. With that being said outside of the culture I still need to initiate something in the interim and that's what I'm gonna go ahead and select Bactrim is a good option for her. 12/14/18 on evaluation today patient appears to be doing  better in regard to her left great toe ulcer as compared to last week's evaluation. There's still some erythema although this is significantly improved which is excellent news. Overall I do believe that she is making good progress is still gonna take some time before she is where I would like her to be from the standpoint of being able to place her back into the total contact cast. Hopefully we will be where we need to be by next week. 12/21/18 on evaluation today patient actually appears to be doing poorly in regard to her toe ulcer. She's been  tolerating the dressing changes without complication. Fortunately there's no signs of systemic infection although she does have a lot of drainage from the toe ulcer and this does [name], [name] ([id number]) seem to be causing some issues at this point. She does have erythema on the distal portion of her toe that appears to be likely cellulitis. 12/28/18 on evaluation today patient actually appears to be doing a little better in my pinion in regard to her toe ulcer. With that being said she still does have some evidence of infection at this time and for her culture she had both E. coli as well as enterococcus as organisms noted on evaluation. For that reason I think that though the Keflex likely has treated the E. coli rather well this has really done nothing for the enterococcus. We are going to have to initiate treatment for this specifically. 01/04/19 on evaluation today patient's toe actually appears to be doing better from the standpoint of infection. She currently would like to see about putting the cash back on I think that this is appropriate as long as she takes care of it and keeps it from getting wet. She is gonna have some drainage we can definitely pass this up with Drawtex and alginate to try to prevent as much drainage as possible from causing the problems. With that being said I do want to at least try her with the cast between now and Tuesday. If there any issues we can't continue to use it then I will discontinue the use of the cast at that point. 01/08/19 on evaluation today patient actually appears to be doing very well as far as her foot ulcer specifically the great toe on the left is concerned. She did have an area of rubbing on the medial aspect of her left ankle which again is from the cast. Fortunately there's no signs of infection at this point in this appears to be a very slight skin breakdown. The patient tells me she felt it rubbing but didn't think it was that  bad. Fortunately there is no signs of active infection at this time which is good news. No fevers, chills, nausea, or vomiting noted at this time. 01/15/19 on evaluation today patient actually appears to be doing well in regard to her toe ulcer. Again as previous she seems to do well and she has the cast on which indicates to me that during the time she doesn't have a cast on she's putting way too much pressure on this region. Obviously I think that's gonna be an issue as with the current national emergency concerning the Covid-19 Virus it has been recommended that we discontinue the use of total contact casting by the chief medical officer of our company, Dr. Simona Huh. The reasoning is that if a patient becomes sick and cannot come into have the cast removed they could not just leave this on for an additional two  weeks. Obviously the hospitals also do not want to receive patient's who are sick into the emergency department to potentially contaminate the region and spread the Covid-19 Virus among other sick individuals within the hospital system. Therefore at this point we are suspending the use of total contact cast until the current emergency subsides. This was all discussed with the patient today as well. 01/22/19 on evaluation today patient's wound on her left great toe appears to be doing slightly worse than previously noted last week. She tells me that she has been on this quite a bit in fact she tells me she's been awake for 38 straight hours. This is due to the fact that she's having to care for grandparents because nobody else will. She has been taking care of them for five the last seven days since I've seen her they both have dementia his is from a stroke and her grandmother's was progressive. Nonetheless she states even her mom who knows her condition and situation has only help two of those days to take care of them she's been taking care of the rest. Fortunately there does not appear to be  any signs of active infection in regard to her toe at this point although obviously it doesn't look as good as it did previous. I think this is directly related to her not taking off the pressure and friction by way of taking things easy. Though I completely understand what's going on. 01/29/19 on evaluation today patient's tools are actually appears to be showing some signs of improvement today compared to last week's evaluation as far as not necessarily the overall size of the wound but the fact that she has some new skin growth in between the two ends of the wound opening. Overall I feel like she has done well she states that she had a family member give her what sounds to be a CAM walker boot which has been helpful as well. 02/05/19 on evaluation today patient's wound bed actually appears to be doing significantly better in regard to her overall appearance of the size of the wound. With that being said she is still having an issue with offloading efficiently enough to get this to close. Apparently there is some signs of infection at this point as well unfortunately. Previously she's done well of Augmentin I really do not see anything that needs to be culture currently but there theme and cellulitis of the foot that I'm seeing I'm gonna go ahead and place her on an antibiotic today to try to help clear this up. 02/12/2019 on evaluation today patient actually appears to be doing poorly in regard to her overall wound status. She tells me she has been using her offloading shoe but actually comes in today wearing her tennis shoe with the AFO brace. Again as I previously discussed with her this is really not sufficient to allow the area to heal appropriately. Nonetheless she continues to be somewhat noncompliant and I do wonder based on what she has told my nurse in the past as to whether or not a good portion of this noncompliance may be recreational drug and alcohol related. She has had a history of  heroin overdose and this was fairly recently in the past couple of months that have been seeing her. Nonetheless overall I feel like her wound looks significantly worse today compared to what it was previous. She still has significant erythema despite the Augmentin I am not sure that this is an appropriate medication for her  infection I am also concerned that the infection may have gone down into her bone. 02/19/19 on evaluation today patient actually appears to be doing about the same in regard to her toe ulcer. Unfortunately she continues to show signs of bone exposure and infection at this point. There does not appear to be any evidence of worsening of the infection but I'm also not really sure that it's getting significantly better. She is on the Augmentin which should be sufficient for the Staphylococcus aureus infection that she has at this point. With that being said she may need IV antibiotics to more appropriately treat this. We did have a discussion today about hyperbaric option therapy. 02/28/19 on evaluation today patient actually appears to be doing much worse in regard to the wound on her left great toe as compared to even my previous evaluation last week. Unfortunately this seems to be training in a pretty poor direction. Her toe was actually now starting to angle laterally and I can actually see the entire joint area of the proximal portion of the digit where is the distal portion of the digit again is no longer even in contact with the joint line. Unfortunately there's a lot more necrotic tissue around the edge and the toe appears to be showing signs of becoming gangrenous in my pinion. I'm very concerned about were things stand at this point. She did see infectious disease and they are planning to send in a prescription for Sivextro for her and apparently this has been approved. With that being said I don't think she should avoid taking this but at the same time I'm not sure that it's  gonna be sufficient to save her toe at this point. 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 03/05/19 on evaluation today patient actually appears to be doing quite well in regard to her toe all things considering. She still has bone exposed but there appears to be much less your thing on overall the appearance of the wound and the toe itself is dramatically improved. She still does have some issues currently obviously with infection she did see vascular as well and there concerned that her blood flow to the toad. For that reason they are setting up for an angiogram next week. 03/14/19 on evaluation today patient appears to be doing very poor in regard to her toe and specifically in regard to the ulceration and the fact that she's starting to notice the toe was leaning even more towards the lateral aspect and the complete joint is visible on the proximal aspect of the joint. Nonetheless she's also noted a significant odor and the tip of the toe is turning more dark and necrotic appearing. Overall I think she is getting worse not better as far as this is concerned. For that reason I am recommending at this point that she likely needs to be seen for likely amputation. [name], [name] ([id number]) READMISSION 03/19/2021 This is a patient that we cared for in this clinic for a prolonged period of time in 2019 and 2020 with a left foot and left first toe wound. 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 Luana Shu. In December  2021 she had an ulcer on her right great toe as well as the fourth and fifth toes. 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 She  also had an angiogram at that time and underwent angioplasty of the right anterior tibial artery. In any case she claims that the wound on the right foot is closed I did not look at this today which was probably an oversight although I think that should be done next week. After her surgery she developed a dehiscence but I do not see any follow-up of this. According to Dr. Deborra Medina last review that she was out of the area being cared for by another physician but recently came back to his attention. 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 A culture of this area showed E. coli apparently before she came back to see Dr. Luana Shu she was supposed to be receiving antibiotics but she did not really take them. Nor is she offloading this area at all. Finally her last hemoglobin A1c listed in epic was in March 2022 at 14.1 she says things are a lot better since then although I am not sure. 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 She had this wound at the time. During this admission she had E. coli sepsis a TEE was negative. 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 She is still a heavy cigarette smoker. She has a history of bipolar disorder seizure disorder ABI in our clinic was 1.05 6/1; left midfoot in the setting of a TMA done previously. Round circular wound with a "knuckle" of protruding tissue. The problem is that the knuckle was not attached to any of the surrounding granulation and this probed proximally widely I removed a large portion of this tissue. This wound goes with considerable undermining laterally. I do not feel any bone there was no  purulence but this is a deep wound. 6/8; in spite of the debridement I did last week. She arrives with a wound looking exactly the same. A protruding "knuckle" of tissue nonadherent to most of the surrounding tissue. There is considerable depth around this from 6-12 o'clock at 2.7 cm and undermining of 1 cm. This does not look overtly infected and the x-ray I did last week was negative for any osseous abnormalities. We have been using silver collagen 6/15; deep tissue culture I did last week showed moderate staph aureus and moderate Pseudomonas. This will definitely require prolonged antibiotic therapy. The pathology on the protuberant area was negative for malignancy fungus etc. 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 We have been using silver collagen. I am going to be prescribing Levaquin for 2 weeks. Her CT scan of the foot is down for 7/5 6/22; CT scan of the foot on 7 5. She says she has hardware in the left leg from her previous fracture. She is on the Levaquin for the deep tissue culture I did that showed methicillin sensitive staph aureus and Pseudomonas. I gave her a 2-week supply and she will have another week. She arrives in clinic today with the same protuberant tissue however this is nonadherent to the tissue surrounding it. I am really  at a loss to explain this unless there is underlying deep tissue infection 6/29; patient presents for 1 week follow-up. She has been using collagen to the wound bed. She reports taking her antibiotics as prescribed.She has no complaints or issues today. She denies signs of infection. 7/6; patient presents for one week followup. She has been using collagen to the wound bed. She states she is taking Levaquin however at times she is not able to keep it down. She denies signs of infection. 7/13; patient presents for 1 week follow-up. She has been using silver alginate to the wound bed. She still  has nausea when taking her antibiotics. She denies signs of infection. 7/20; patient presents for 1 week follow-up. She has been using silver alginate with gentamicin cream to the wound bed. She denies any issues and has no complaints today. She denies signs of infection. 7/27; patient presents for 1 week follow-up. She continues to use silver alginate with gentamicin cream to the wound bed. She reports starting her antibiotics. She has no issues or complaints. Overall she reports stability to the wound. 8/3; patient presents for 1 week follow-up. She has been using silver alginate with gentamicin cream to the wound bed. She reports completing all antibiotics. She has no issues or complaints today. She denies signs of infection. 8/17; patient presents for 2-week follow-up. He is to use silver alginate to the wound bed. She has no issues or complaints today. She denies signs of infection. She reports her pain has improved in her foot since last clinic visit 8/24; patient presents for 1 week follow-up. She continues to use silver alginate to the wound bed. She has no issues or complaints. She denies signs of infection. Pain is stable. Patient History Information obtained from Patient. Social History Current every day smoker, Marital Status - Single, Alcohol Use - Never, Drug Use - Prior History, Caffeine Use - Daily. Medical History Ear/Nose/Mouth/Throat Patient has history of Chronic sinus problems/congestion, Middle ear problems Hematologic/Lymphatic Patient has history of Anemia Respiratory Patient has history of Chronic Obstructive Pulmonary Disease (COPD) Cardiovascular Patient has history of Congestive Heart Failure - EF 33% Endocrine Patient has history of Type II Diabetes Genitourinary Patient has history of End Stage Renal Disease Integumentary (Skin) Patient has history of History of pressure wounds Neurologic Patient has history of Neuropathy - feel and legs Sterne, Willard  A. ([id number]) Objective Constitutional Vitals Time Taken: 10:46 AM, Height: 69 in, Weight: 185 lbs, BMI: 27.3, Temperature: 98.7 F, Pulse: 90 bpm, Respiratory Rate: 18 breaths/min, Blood Pressure: 109/74 mmHg. General Notes: Left foot (Transmetatarsal amputation): Wound to the plantar aspect that that has granulation tissue and nonviable tissue present. There is significant callus circumferentially. Integumentary (Hair, Skin) Wound #7 status is Open. Original cause of wound was Gradually Appeared. The date acquired was: 08/07/2020. The wound has been in treatment 12 weeks. The wound is located on the [place]. The wound measures 2cm length x 3cm width x 1cm depth; 4.712cm^2 area and 4.712cm^3 volume. There is Fat Layer (Subcutaneous Tissue) exposed. There is no tunneling or undermining noted. There is a medium amount of serosanguineous drainage noted. The wound margin is thickened. There is medium (34-66%) red, pink, hyper - granulation within the wound bed. There is a medium (34-66%) amount of necrotic tissue within the wound bed including Adherent Slough. Assessment Active Problems ICD-10 Type 2 diabetes mellitus with foot ulcer 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ity Type 2 diabetes mellitus with  diabetic polyneuropathy Patient's wound is stable. I debrided nonviable tissue. Patient's wound has shown improvement in the past month albeit very slowly. No obvious signs of infection today. Shipment for Apligraf has been delayed. It is not available today. We will continue with silver alginate and gentamicin ointment. Follow-up in 1 week Procedures Wound #7 Pre-procedure diagnosis of Wound #7 is a Pressure Ulcer located on the Left,Midline,Plantar Foot . There was a Excisional Skin/Subcutaneous Tissue Debridement with a total area of 6 sq cm performed by Kalman Shan, MD. With the following instrument(s): Curette to remove  Viable and Non-Viable tissue/material. Material removed includes Callus and Subcutaneous Tissue and. A time out was conducted at 11:06, prior to the start of the procedure. A Minimum amount of bleeding was controlled with Pressure. The procedure was tolerated well. Post Debridement Measurements: 2cm length x 3cm width x 1cm depth; 4.712cm^3 volume. Post debridement Stage noted as Category/Stage II. Character of Wound/Ulcer Post Debridement is stable. Post procedure Diagnosis Wound #7: Same as Pre-Procedure Plan Follow-up Appointments: Return Appointment in 1 week. Bathing/ Shower/ Hygiene: Clean wound with Normal Saline or wound cleanser. Off-Loading: Gertz, Axelle A. ([id number]) Open toe surgical shoe with peg assist. Turn and reposition every 2 hours WOUND #7: - Foot Wound Laterality: Plantar, Left, Midline Cleanser: Soap and Water 3 x Per Week/30 Days Discharge Instructions: Gently cleanse wound with antibacterial soap, rinse and pat dry prior to dressing wounds Peri-Wound Care: Desitin Maximum Strength Ointment 4 (oz) 3 x Per Week/30 Days Discharge Instructions: Peri wound Topical: Gentamicin 3 x Per Week/30 Days Discharge Instructions: Apply to wound bed and tunnel area under the dressing Primary Dressing: Silvercel Small 2x2 (in/in) 3 x Per Week/30 Days Discharge Instructions: Apply to wound bed Secondary Dressing: ABD Pad 5x9 (in/in) 3 x Per Week/30 Days Discharge Instructions: Cover with ABD pad on top of dressing Secondary Dressing: Gauze 3 x Per Week/30 Days Discharge Instructions: To hold dressing in place Secured With: Kerlix Roll Sterile or Non-Sterile 6-ply 4.5x4 (yd/yd) 3 x Per Week/30 Days Discharge Instructions: Apply Kerlix as directed 1. In office sharp debridement 2. Continue silver alginate and gentamicin 3. Follow-up in 1 week Electronic Signature(s) Signed: 06/16/2021 11:20:57 AM By: Kalman Shan DO Entered By: Kalman Shan on 06/16/2021  11:20:16 [name] ([id number]) -------------------------------------------------------------------------------- ROS/PFSH Details Patient Name: Heather Dear A. Date of Service: 06/16/2021 10:45 AM Medical Record Number: [id number] Patient Account Number: [id number] Date of Birth/Sex: 09-06-1976 (45 y.o. F) Treating RN: Dolan Amen Primary Care Provider: Raelene Bott Other Clinician: Referring Provider: Raelene Bott Treating Provider/Extender: Yaakov Guthrie in Treatment: 12 Information Obtained From Patient Ear/Nose/Mouth/Throat Medical History: Positive for: Chronic sinus problems/congestion; Middle ear problems Hematologic/Lymphatic Medical History: Positive for: Anemia Respiratory Medical History: Positive for: Chronic Obstructive Pulmonary Disease (COPD) Cardiovascular Medical History: Positive for: Congestive Heart Failure - EF 33% Endocrine Medical History: Positive for: Type II Diabetes Time with diabetes: 14 years Treated with: Insulin, Oral agents Blood sugar tested every day: No Blood sugar testing results: Bedtime: 176 Genitourinary Medical History: Positive for: End Stage Renal Disease Integumentary (Skin) Medical History: Positive for: History of pressure wounds Neurologic Medical History: Positive for: Neuropathy - feel and legs HBO Extended History Items Ear/Nose/Mouth/Throat: Ear/Nose/Mouth/Throat: Chronic sinus problems/congestion Middle ear problems Immunizations Pneumococcal Vaccine: Received Pneumococcal Vaccination: No Bodiford, Lulani A. ([id number]) Implantable Devices No devices added Family and Social History Current every day smoker; Marital Status - Single; Alcohol Use: Never; Drug Use: Prior History; Caffeine Use: Daily Electronic Signature(s) Signed: 06/16/2021 11:20:57  AM By: Kalman Shan DO Signed: 06/16/2021 1:41:52 PM By: Dolan Amen RN Entered By: Kalman Shan on 06/16/2021 11:17:54 [name] ([id number]) -------------------------------------------------------------------------------- [facility] Details Patient Name: Heather Dear A. Date of Service: 06/16/2021 Medical Record Number: [id number] Patient Account Number: [id number] Date of Birth/Sex: [birth year] (45 y.o. F) Treating RN: Dolan Amen Primary Care Provider: Raelene Bott Other Clinician: Referring Provider: Raelene Bott Treating Provider/Extender: Yaakov Guthrie in Treatment: 12 Diagnosis Coding ICD-10 Codes Code Description E11.621 Type 2 diabetes mellitus with foot ulcer 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ity E11.42 Type 2 diabetes mellitus with diabetic polyneuropathy Facility Procedures CPT4 Code: [id number] Description: [id number] - DEB SUBQ TISSUE 20 SQ CM/< Modifier: Quantity: 1 CPT4 Code: Description: ICD-10 Diagnosis Description E11.621 Type 2 diabetes mellitus with foot ulcer 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 E11.42 Type 2 diabetes mellitus with diabetic polyneuropathy Modifier: severity Quantity: Physician Procedures CPT4 Code: [id number] Description: 11042 - WC PHYS SUBQ TISS 20 SQ CM Modifier: Quantity: 1 CPT4 Code: Description: ICD-10 Diagnosis Description E11.621 Type 2 diabetes mellitus with foot ulcer 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 E11.42 Type 2 diabetes mellitus with diabetic polyneuropathy Modifier: severity Quantity: Electronic Signature(s) Signed: 06/16/2021 11:20:57 AM By: Kalman Shan DO Entered By: Kalman Shan on 06/16/2021 11:20:30

## 2021-06-23 ENCOUNTER — Encounter: Payer: Medicaid Other | Admitting: Internal Medicine

## 2021-06-30 ENCOUNTER — Other Ambulatory Visit: Payer: Self-pay | Admitting: Internal Medicine

## 2021-06-30 ENCOUNTER — Other Ambulatory Visit: Payer: Self-pay

## 2021-06-30 ENCOUNTER — Encounter: Payer: Medicaid Other | Attending: Internal Medicine | Admitting: Internal Medicine

## 2021-06-30 DIAGNOSIS — E1122 Type 2 diabetes mellitus with diabetic chronic kidney disease: Secondary | ICD-10-CM | POA: Insufficient documentation

## 2021-06-30 DIAGNOSIS — I509 Heart failure, unspecified: Secondary | ICD-10-CM | POA: Diagnosis not present

## 2021-06-30 DIAGNOSIS — F319 Bipolar disorder, unspecified: Secondary | ICD-10-CM | POA: Insufficient documentation

## 2021-06-30 DIAGNOSIS — G40909 Epilepsy, unspecified, not intractable, without status epilepticus: Secondary | ICD-10-CM | POA: Diagnosis not present

## 2021-06-30 DIAGNOSIS — N186 End stage renal disease: Secondary | ICD-10-CM | POA: Insufficient documentation

## 2021-06-30 DIAGNOSIS — M21372 Foot drop, left foot: Secondary | ICD-10-CM | POA: Diagnosis not present

## 2021-06-30 DIAGNOSIS — E1142 Type 2 diabetes mellitus with diabetic polyneuropathy: Secondary | ICD-10-CM | POA: Diagnosis not present

## 2021-06-30 DIAGNOSIS — L97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ity: Secondary | ICD-10-CM

## 2021-06-30 DIAGNOSIS — Z79899 Other long term (current) drug therapy: Secondary | ICD-10-CM | POA: Insufficient documentation

## 2021-06-30 DIAGNOSIS — E11621 Type 2 diabetes mellitus with foot ulcer: Secondary | ICD-10-CM

## 2021-06-30 DIAGNOSIS — I132 Hypertensive heart and chronic kidney disease with heart failure and with stage 5 chronic kidney disease, or end stage renal disease: Secondary | ICD-10-CM | POA: Insufficient documentation

## 2021-06-30 DIAGNOSIS — S81801A Unspecified open wound, right lower leg, initial encounter: Secondary | ICD-10-CM | POA: Diagnosis not present

## 2021-06-30 DIAGNOSIS — L97509 Non-pressure chronic ulcer of other part of unspecified foot with unspecified severity: Secondary | ICD-10-CM

## 2021-06-30 NOTE — Progress Notes (Signed)
SELENNA, HOMMEL ([id number]) Visit Report for 06/30/2021 Chief Complaint Document Details Patient Name: [name], [name] Date of Service: 06/30/2021 10:45 AM Medical Record Number: [id number] Patient Account Number: [id number] Date of Birth/Sex: 1975/11/17 (45 y.o. F) Treating RN: Dolan Amen Primary Care Provider: Raelene Bott Other Clinician: Referring Provider: Raelene Bott Treating Provider/Extender: Yaakov Guthrie in Treatment: 14 Information Obtained from: Patient Chief Complaint Left great toe ulcer 03/19/2021; patient referred by Dr. Luana Shu who has been looking after her left foot for quite a period of time for review of a nonhealing area in the left midfoot Electronic Signature(s) Signed: 06/30/2021 12:06:06 PM By: Kalman Shan DO Entered By: Kalman Shan on 06/30/2021 11:51:58 Claiborne, [name] ([id number]) -------------------------------------------------------------------------------- Debridement Details Patient Name: Heather Dear A. Date of Service: 06/30/2021 10:45 AM Medical Record Number: [id number] Patient Account Number: [id number] Date of Birth/Sex: [birth year] (45 y.o. F) Treating RN: Dolan Amen Primary Care Provider: Raelene Bott Other Clinician: Referring Provider: Raelene Bott Treating Provider/Extender: Yaakov Guthrie in Treatment: 14 Debridement Performed for Wound #7 Left,Midline,Plantar Foot Assessment: Performed By: Physician Kalman Shan, MD Debridement Type: Debridement Level of Consciousness (Pre- Awake and Alert procedure): Pre-procedure Verification/Time Out Yes - 11:32 Taken: Start Time: 11:32 Total Area Debrided (L x W): 3 (cm) x 3 (cm) = 9 (cm) Tissue and other material Non-Viable, Callus debrided: Level: Non-Viable Tissue Debridement Description: Selective/Open Wound Instrument: Blade, Curette Bleeding: None Response to Treatment: Procedure was tolerated well Level of Consciousness (Post- Awake  and Alert procedure): Post Debridement Measurements of Total Wound Length: (cm) 2.3 Stage: Category/Stage II Width: (cm) 3 Depth: (cm) 1 Volume: (cm) 5.419 Character of Wound/Ulcer Post Debridement: Stable Post Procedure Diagnosis Same as Pre-procedure Electronic Signature(s) Signed: 06/30/2021 12:06:06 PM By: Kalman Shan DO Signed: 06/30/2021 4:14:25 PM By: Dolan Amen RN Entered By: Dolan Amen on 06/30/2021 11:35:20 Appelt, [name] ([id number]) -------------------------------------------------------------------------------- HPI Details Patient Name: Heather Dear A. Date of Service: 06/30/2021 10:45 AM Medical Record Number: [id number] Patient Account Number: [id number] Date of Birth/Sex: 07/31/1976 (45 y.o. F) Treating RN: Dolan Amen Primary Care Provider: Raelene Bott Other Clinician: Referring Provider: Raelene Bott Treating Provider/Extender: Yaakov Guthrie in Treatment: 14 History of Present Illness HPI Description: 01/18/18-She is here for initial evaluation of the left great toe ulcer. She is a poor historian in regards to timeframe in detail. She states approximately 4 weeks ago she lacerated her toe on something in the house. She followed up with her primary care who placed her on Bactrim and ultimately a second dose of Bactrim prior to coming to wound clinic. She states she has been treating the toe with peroxide, Betadine and a Band-Aid. She did not check her blood sugar this morning but checked it yesterday morning it was 327; she is unaware of a recent A1c and there are no current records. She saw Dr. she would've orthopedics last week for an old injury to the left ankle, she states he did not see her toe, nor did she bring it to his attention. She smokes approximately 1 pack cigarettes a day. Her social situation is concerning, she arrives this morning with her mother who appears extremely intoxicated/under the influence; her mother was asked  to leave the room and be monitored by the patient's grandmother. The patient's aunt then accompanied the patient and the room throughout the rest of the appointment. We had a lengthy discussion regarding the deleterious effects of uncontrolled hyperglycemia and smoking as it relates to wound healing and overall health.  She was strongly encouraged to decrease her smoking and get her diabetes under better control. She states she is currently on a diet and has cut down her [facility] consumption. The left toe is erythematous, macerated and slightly edematous with malodor present. The edema in her left foot is below her baseline, there is no erythema streaking. We will treat her with Santyl, doxycycline; we have ordered and xray, culture and provided a Peg assist surgical shoe and cultured the wound. 01/25/18-She is here in follow-up evaluation for a left great toe ulcer and presents with an abscess to her suprapubic area. She states her blood sugars remain elevated, feeling "sick" and if levels are below 250, but she is trying. She has made no attempt to decrease her smoking stating that we "can't take away her food in her cigarettes". She has been compliant with offloading using the PEG assist you. She is using Santyl daily. the culture obtained last week grew staph aureus and Enterococcus faecalis; continues on the doxycycline and Augmentin was added on Monday. The suprapubic area has erythema, no femoral variation, purple discoloration, minimal induration, was accessed with a cotton tip applicator with sanguinopurulent drainage, this was cultured, I suspect the current antibiotic treatment will cover and we will not add anything to her current treatment plan. She was advised to go to urgent care or ER with any change in redness, induration or fever. 02/01/18-She is here in follow-up evaluation for left great toe ulcers and a new abdominal abscess from last week. She was able to use packing until  earlier this week, where she "forgot it was there". She states she was feeling ill with GI symptoms last week and was not taking her antibiotic. She states her glucose levels have been predominantly less than 200, with occasional levels between 200-250. She thinks this was contributing to her GI symptoms as they have resolved without intervention. There continues to be significant laceration to left toe, otherwise it clinically looks stable/improved. There is now less superficial opening to the lateral aspect of the great toe that was residual blister. We will transition to [facility] to all wounds, she will continue her Augmentin. If there is no change or deterioration next week for reculture. 02/08/18-She is here in follow-up evaluation for left great toe ulcer and abdominal ulcer. There is an improvement in both wounds. She has been wrapping her left toe with coban, not by our direction, which has created an area of discoloration to the medial aspect; she has been advised to NOT use coban secondary to her neuropathy. She states her glucose levels have been high over this last week ranging from 200-350, she continues to smoke. She admits to being less compliant with her offloading shoe. We will continue with same treatment plan and she will follow-up next week. 02/15/18-She is here in follow-up evaluation for left great toe ulcer and abdominal ulcer. The abdominal ulcer is epithelialized. The left great toe ulcer is improved and all injury from last week using the Coban wrap is resolved, the lateral ulcer is healed. She admits to noncompliance with wearing offloading shoe and admits to glucose levels being greater than 300 most of the week. She continues to smoke and expresses no desire to quit. There is one area medially that probes deeper than it has historically, erythema to the toe and dorsal foot has consistently waxed and waned. There is no overt signs of cellulitis or infection but we will  culture the wound for any occult infection given  the new area of depth and erythema. We will hold off on sensitivities for initiation of antibiotic therapy. 02/22/18-She is here in follow up evaluation for left great toe ulcer. There is overall significant improvement in both wound appearance, erythema and edema with changes made last week. She was not initiated on antibiotic therapy. Culture obtained last week showed oxacillin sensitive staph aureus, sensitive to clindamycin. Clindamycin has been called into the pharmacy but she has been instructed to hold off on initiation secondary to overall clinical improvement and her history of antibiotic intolerance. She has been instructed to contact the clinic with any noted changes/deterioration and the wound, erythema, edema and/or pain. She will follow-up next week. She continues to smoke and her glucose levels remain elevated >250; she admits to compliance with offloading shoe 03/01/18 on evaluation today patient appears to be doing fairly well in regard to her left first toe ulcer. She has been tolerating the dressing changes with the [facility] Dressing without complication and overall this has definitely showed signs of improvement according to records as well is what the patient tells me today. I'm very pleased in that regard. She is having no pain today 03/08/18 She is here for follow up evaluation of a left great toe ulcer. She remains non-compliant with glucose control and smoking cessation; glucose levels consistently >200. She states that she got new shoe inserts/peg assist. She admits to compliance with offloading. Since my last evaluation there is significant improvement. We will switch to prisma at this time and she will follow up next week. She is noted to be tachycardic at this appointment, heart rate 120s; she has a history of heart rate 70-130 according to our records. She admits to extreme agitation r/t personal issues; she was advised to  monitor her heartrate and contact her physician if it does not return to a more normal range (<100). She takes cardizem twice daily. 03/15/18-She is here in follow-up evaluation for left great toe ulcer. She remains noncompliant with glucose control and smoking cessation. She admits to compliance with wearing offloading shoe. The ulcer is improved/stable and we will continue with the same treatment plan and she will follow-up next week 03/22/18-She is here for evaluation for left great toe ulcer. There continues to be significant improvement despite recurrent hyperglycemia (over 500 yesterday) and she continues to smoke. She has been compliant with offloading and we will continue with same treatment plan and she will follow-up next week. 03/29/18-She is here for evaluation for left great toe ulcer. Despite continuing to smoke and uncontrolled diabetes she continues to improve. She is compliant with offloading shoe. We will continue with the same treatment plan and she will follow-up next week 04/05/18- She is here in follow up evaluation for a left great toe ulcer; she presents with small pustule to left fifth toe (resembles ant bite). She admits to compliance with wearing offloading shoe; continues to smoke or have uncontrolled blood glucose control. There is more callus than usual with evidence of bleeding; she denies known trauma. 04/12/18-She is here for evaluation of left great toe ulcer. Despite noncompliance with glycemic control and smoking she continues to make Locy, Latrish A. ([id number]) improvement. She continues to wear offloading shoe. The pustule, that was identified last week, to the left fifth toe is resolved. She will follow-up in 2 weeks 05/03/18-she is seen in follow-up evaluation for a left great toe ulcer. She is compliant with offloading, otherwise noncompliant with glycemic control and smoking. She has plateaued and there  is minimal improvement noted. We will transition to  [facility], replaced the insert to her surgical shoe and she will follow-up in one week 05/10/18- She is here in follow up evaluation for a left great toe ulcer. It appears stable despite measurement change. We will continue with same treatment plan and follow up next week. 05/24/18-She is seen in follow-up evaluation for a left great toe ulcer. She remains compliant with offloading, has made significant improvement in her diet, decreasing the amount of sugar/soda. She said her recent A1c was 10.9 which is lower than. She did see a diabetic nutritionist/educator yesterday. She continues to smoke. We will continue with the same treatment plan and she'll follow-up next week. 05/31/18- She is seen in follow-up evaluation for left great toe ulcer. She continues to remain compliant with offloading, continues to make improvement in her diet, increasing her water and decreasing the amount of sugar/soda. She does continue to smoke with no desire to quit. We will apply Prisma to the depth and Hydrofera Blue over. We have not received insurance authorization for oasis. She will follow up next week. 06/07/18-She is seen in follow-up evaluation for left great toe ulcer. It has stalled according to today's measurements although base appears stable. She says she saw a diabetic educator yesterday; her average blood sugars are less than 300 which is an improvement for her. She continues to smoke and states "that's my next step" She continues with water over soda. We will order for xray, culture and reinstate ace wrap compression prior to placing apligraf for next week. She is voicing no complaints or concerns. Her dressing will change to iodoflex over the next week in preparation for apligraf. 06/14/18-She is seen in follow-up evaluation for left great toe ulcer. Plain film x-ray performed last week was negative for osteomyelitis. Wound culture obtained last week grew strep B and OSSA; she is initiated on keflex and  cefdinir today; there is erythema to the toe which could be from ace wrap compression, she has a history of wrapping too tight and has has been encouraged to maintain ace wraps that we place today. We will hold off on application of apligraf today, will apply next week after antibiotic therapy has been initiated. She admits today that she has resumed taking a shower with her foot/toe submerged in water, she has been reminded to keep foot/toe out of the bath water. She will be seen in follow up next week 06/21/18-she is seen in follow-up evaluation for left great toe ulcer. She is tolerating antibiotic therapy with no GI disturbance. The wound is stable. Apligraf was applied today. She has been decreasing her smoking, only had 4 cigarettes yesterday and 1 today. She continues being more compliant in diabetic diet. She will follow-up next week for evaluation of site, if stable will remove at 2 weeks. 06/28/18- She is here in follow up evalution. Apligraf was placed last week, she states the dressing fell off on Tuesday and she was dressing with hydrofera blue. She is healed and will be discharged from the clinic today. She has been instructed to continue with smoking cessation, continue monitoring glucose levels, offloading for an additional 4 weeks and continue with hydrofera blue for additional two weeks for any possible microscopic opening. Readmission: 08/07/18 on evaluation today patient presents for reevaluation concerning the ulcer of her right great toe. She was previously discharged on 06/28/18 healed. Nonetheless she states that this began to show signs of drainage she subsequently went to her primary care  provider. Subsequently an x-ray was performed on 08/01/18 which was negative. The patient was also placed on antibiotics at that time. Fortunately they should have been effective for the infection. Nonetheless she's been experiencing some improvement but still has a lot of drainage coming  from the wound itself. 08/14/18 on evaluation today patient's wound actually does show signs of improvement in regard to the erythema at this point. She has completed the antibiotics. With that being said we did discuss the possibility of placing her in a total contact cast as of today although I think that I may want to give this just a little bit more time to ensure nothing recurrence as far as her infection is concerned. I do not want to put in the cast and risk infection at that time if things are not completely resolved. With that being said she is gonna require some debridement today. 08/21/18 on evaluation today patient actually appears to be doing okay in regard to her toe ulcer. She's been tolerating the dressing changes without complication. With that being said it does appear that she is ready and in fact I think it's appropriate for [place] to go ahead and initiate the total contact cast today. Nonetheless she will require some sharp debridement to prepare the wound for application. Overall I feel like things have been progressing well but we do need to do something to get this to close more readily. 08/24/18 patient seen today for reevaluation after having had the total contact cast applied on Tuesday. She seems to have done very well the wound appears to be doing great and overall I'm pleased with the progress that she's made. There were no abnormal areas of rubbing from the cast on her lower extremity. 08/30/18 on evaluation today patient actually appears to be completely healed in regard to her plantar toe ulcer. She tells me at this point she's been having a lot of issues with the cast. She almost fell a couple of times the state shall the step of her dog a couple times as well. This is been a very frustrating process for her other nonetheless she has completely healed the wound which is excellent news. Overall there does not appear to be the evidence of infection at this time which is  great news. 09/11/18 evaluation today patient presents for follow-up concerning her great toe ulcer on the left which has unfortunately reopened since I last saw her which was only a couple of weeks ago. Unfortunately she was not able to get in to get the shoe and potentially the AFO that's gonna be necessary due to her left foot drop. She continues with offloading shoe but this is not enough to prevent her from reopening it appears. When we last had her in the total contact cast she did well from a healing standpoint but unfortunately the wound reopened as soon as she came out of the cast within just a couple of weeks. 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 This is also part of the reason why she falls on a regular basis. I just do not believe that is gonna be safe for [place] to reinitiate the total contact cast as last time we had this on she fell 3 times one week which is definitely not normal  for her. 09/18/18 upon evaluation today the patient actually appears to be doing about the same in regard to her toe ulcer. She did not contact [employer] as I asked her to even though I had given her the prescription. In fact she actually states that she has no idea where the prescription is. She did apparently call [employer] and they told her that all she needed to do was bring the prescription in order to be able to be seen and work on getting the AFO for her left foot. With all that being said she still does not have an appointment and I'm not sure were things stand that regard. I will give her a new prescription today in order to contact them to get this set up. 09/25/18 on evaluation today patient actually appears to be doing about the same in regard to her toes ulcer. She does have a small areas which seems to  have a lot of callous buildup around the edge of the wound which is going to need sharp debridement today. She still is waiting to be scheduled for evaluation with [employer] for possibility of an AFO. She states there supposed to call her tomorrow to get this set up. Unfortunately it does appear that her foot specifically the toe area is showing signs of erythema. There does not appear to be any systemic infection which is in these good news. [name], [name] ([id number]) 10/02/18 on evaluation today patient actually appears to be doing about the same in regard to her toe ulcer. This really has not done too well although it's not significantly larger it's also not significantly smaller. She has been tolerating the dressing changes without complication. She actually has her appointment with [employer] and [place] tomorrow to hopefully be measured for obtaining and AFO splint. I think this would be helpful preventing this from reoccurring. We had contemplated starting the cast this week although to be honest I am reluctant to do that as she's been having nausea, vomiting, and seizure activity over the past three days. She has a history of seizures and have been told is nothing that can be done for these. With that being said I do believe that along with the seizures have the nausea vomiting which upon further questioning doesn't seem to be the normal for her and makes me concerned for the possibility of infection or something else going on. I discussed this with the patient and her mother during the office visit today. I do not feel the wound is effective but maybe something else. The responses this was "this just happens to her at times and we don't know why". They did not seem to be interested in going to the hospital to have this checked out further. 10/09/18 on evaluation today patient presents for follow-up concerning her ongoing toe ulcer. She has been tolerating the dressing changes without  complication. Fortunately there does not appear to be any evidence of infection which is great news however I do think that the patient would benefit from going ahead for with the total contact cast. She's actually in a wheelchair today she tells me that she will use her walker if we initiate the cast. I was very specific about the fact that if we were gonna do the cast I wanted to make sure that she was using the walker in order to prevent any falls. She tells me she does not have stairs that she has to traverse on a regular basis at her home. She has not  had any seizures since last week again that something that happens to her often she tells me she did talk to [employer] and they said that it may take up to three weeks to get the brace approved for her. Hopefully that will not take that long but nonetheless in the meantime I do think the cast could be of benefit. 10/12/18 on evaluation today patient appears to be doing rather well in regard to her toe ulcer. It's just been a few days and already this is significantly improved both as far as overall appearance and size. Fortunately there's no sign of infection. She is here for her first obligatory cast change. 10/19/18 Seen today for follow up and management of left great toe ulcer. Wound continues to show improvement. Noted small open area with seroussang drainage with palpation. Denies any increased pain or recent fevers during visit. She will continue calcium alginate with offloading shoe. Denies any questions or concerns during visit. 10/26/18 on evaluation today patient appears to be doing about the same as when I last saw her in regard to her wound bed. Fortunately there does not appear to be any signs of infection. Unfortunately she continues to have a breakdown in regard to the toe region any time that she is not in the cast. It takes almost no time at all for this to happen. Nonetheless she still has not heard anything from the brace being made  by [employer] as to when exactly this will be available to her. Fortunately there is no signs of infection at this time. 10/30/18 on evaluation today patient presents for application of the total contact cast as we just received him this morning. Fortunately we are gonna be able to apply this to her today which is great news. She continues to have no significant pain which is good news. Overall I do feel like things have been improving while she was the cast is when she doesn't have a cast that things get worse. She still has not really heard anything from [facility] regarding her brace. 11/02/18 upon evaluation today patient's wound already appears to be doing significantly better which is good news. Fortunately there does not appear to be any signs of infection also good news. Overall I do think the total contact cast as before is helping to heal this area unfortunately it's just not gonna likely keep the area closed and healed without her getting her brace at least. Again the foot drop is a significant issue for her. 11/09/18 on evaluation today patient appears to be doing excellent in regard to her toe ulcer which in fact is completely healed. Fortunately we finally got the situation squared away with the paperwork which was needed to proceed with getting her brace approved by Medicaid. I have filled that out unfortunately that information has been sent to the orthopedic office that I worked at 2 1/2 years ago and not tired Current wound care measures. Fortunately she seems to be doing very well at this time. 11/23/18 on evaluation today patient appears to be doing More Poorly Compared to Last Time I Saw Her. At [facility] She Had Completely Healed. Currently she is continuing to have issues with reopening. She states that she just found out that the brace was approved through Medicaid now she just has to go get measured in order to have this fitted for her and then made. Subsequently she does not have an  appointment for this yet that is going to complicate things we obviously cannot  put her back in the cast if we do not have everything measured because they're not gonna be able to measure her foot while she is in the cast. 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 Obviously this is unfortunate and does have me somewhat worried as well. 11/30/18 on evaluation today patient's toe ulcer actually appears to be doing fairly well. The good news is she will be getting her brace in the shoes next week on Wednesday. Hopefully we will be able to get this to heal without having to go back in the cast however she may need the cast in order to get the wound completely heal and then go from there. Fortunately there's no signs of infection at this time. 12/07/18 on evaluation today patient fortunately did receive her brace and she states she could tell this definitely makes her walk better. With that being said she's been having issues with her toe where she noticed yesterday there was a lot of tissue that was loosing off this appears to be much larger than what it was previous. She also states that her leg has been read putting much across the top of her foot just about the ankle although this seems to be receiving somewhat. The total area is still red and appears to be someone infected as best I can tell. She is previously taken Bactrim and that may be a good option for her today as well. We are gonna see what I wound culture shows as well and I think that this is definitely appropriate. With that being said outside of the culture I still need to initiate something in the interim and that's what I'm gonna go ahead and select Bactrim is a good option for her. 12/14/18 on evaluation today patient appears to be doing better in regard to her left great toe ulcer as compared to last week's evaluation. There's still some  erythema although this is significantly improved which is excellent news. Overall I do believe that she is making good progress is still gonna take some time before she is where I would like her to be from the standpoint of being able to place her back into the total contact cast. Hopefully we will be where we need to be by next week. 12/21/18 on evaluation today patient actually appears to be doing poorly in regard to her toe ulcer. She's been tolerating the dressing changes without complication. Fortunately there's no signs of systemic infection although she does have a lot of drainage from the toe ulcer and this does seem to be causing some issues at this point. She does have erythema on the distal portion of her toe that appears to be likely cellulitis. 12/28/18 on evaluation today patient actually appears to be doing a little better in my pinion in regard to her toe ulcer. With that being said she still does have some evidence of infection at this time and for her culture she had both E. coli as well as enterococcus as organisms noted on evaluation. For that reason I think that though the Keflex likely has treated the E. coli rather well this has really done nothing for the enterococcus. We are going to have to initiate treatment for this specifically. [name], [name] ([id number]) 01/04/19 on evaluation today patient's toe actually appears to be doing better from the standpoint of infection. She currently would like to see about  putting the cash back on I think that this is appropriate as long as she takes care of it and keeps it from getting wet. She is gonna have some drainage we can definitely pass this up with Drawtex and alginate to try to prevent as much drainage as possible from causing the problems. With that being said I do want to at least try her with the cast between now and Tuesday. If there any issues we can't continue to use it then I will discontinue the use of the cast at that  point. 01/08/19 on evaluation today patient actually appears to be doing very well as far as her foot ulcer specifically the great toe on the left is concerned. She did have an area of rubbing on the medial aspect of her left ankle which again is from the cast. Fortunately there's no signs of infection at this point in this appears to be a very slight skin breakdown. The patient tells me she felt it rubbing but didn't think it was that bad. Fortunately there is no signs of active infection at this time which is good news. No fevers, chills, nausea, or vomiting noted at this time. 01/15/19 on evaluation today patient actually appears to be doing well in regard to her toe ulcer. Again as previous she seems to do well and she has the cast on which indicates to me that during the time she doesn't have a cast on she's putting way too much pressure on this region. Obviously I think that's gonna be an issue as with the current national emergency concerning the Covid-19 Virus it has been recommended that we discontinue the use of total contact casting by the chief medical officer of our company, Dr. Simona Huh. The reasoning is that if a patient becomes sick and cannot come into have the cast removed they could not just leave this on for an additional two weeks. Obviously the hospitals also do not want to receive patient's who are sick into the emergency department to potentially contaminate the region and spread the Covid-19 Virus among other sick individuals within the hospital system. Therefore at this point we are suspending the use of total contact cast until the current emergency subsides. This was all discussed with the patient today as well. 01/22/19 on evaluation today patient's wound on her left great toe appears to be doing slightly worse than previously noted last week. She tells me that she has been on this quite a bit in fact she tells me she's been awake for 38 straight hours. This is due to the fact  that she's having to care for grandparents because nobody else will. She has been taking care of them for five the last seven days since I've seen her they both have dementia his is from a stroke and her grandmother's was progressive. Nonetheless she states even her mom who knows her condition and situation has only help two of those days to take care of them she's been taking care of the rest. Fortunately there does not appear to be any signs of active infection in regard to her toe at this point although obviously it doesn't look as good as it did previous. I think this is directly related to her not taking off the pressure and friction by way of taking things easy. Though I completely understand what's going on. 01/29/19 on evaluation today patient's tools are actually appears to be showing some signs of improvement today compared to last week's evaluation as far as  not necessarily the overall size of the wound but the fact that she has some new skin growth in between the two ends of the wound opening. Overall I feel like she has done well she states that she had a family member give her what sounds to be a CAM walker boot which has been helpful as well. 02/05/19 on evaluation today patient's wound bed actually appears to be doing significantly better in regard to her overall appearance of the size of the wound. With that being said she is still having an issue with offloading efficiently enough to get this to close. Apparently there is some signs of infection at this point as well unfortunately. Previously she's done well of Augmentin I really do not see anything that needs to be culture currently but there theme and cellulitis of the foot that I'm seeing I'm gonna go ahead and place her on an antibiotic today to try to help clear this up. 02/12/2019 on evaluation today patient actually appears to be doing poorly in regard to her overall wound status. She tells me she has been using her offloading  shoe but actually comes in today wearing her tennis shoe with the AFO brace. Again as I previously discussed with her this is really not sufficient to allow the area to heal appropriately. Nonetheless she continues to be somewhat noncompliant and I do wonder based on what she has told my nurse in the past as to whether or not a good portion of this noncompliance may be recreational drug and alcohol related. She has had a history of heroin overdose and this was fairly recently in the past couple of months that have been seeing her. Nonetheless overall I feel like her wound looks significantly worse today compared to what it was previous. She still has significant erythema despite the Augmentin I am not sure that this is an appropriate medication for her infection I am also concerned that the infection may have gone down into her bone. 02/19/19 on evaluation today patient actually appears to be doing about the same in regard to her toe ulcer. Unfortunately she continues to show signs of bone exposure and infection at this point. There does not appear to be any evidence of worsening of the infection but I'm also not really sure that it's getting significantly better. She is on the Augmentin which should be sufficient for the Staphylococcus aureus infection that she has at this point. With that being said she may need IV antibiotics to more appropriately treat this. We did have a discussion today about hyperbaric option therapy. 02/28/19 on evaluation today patient actually appears to be doing much worse in regard to the wound on her left great toe as compared to even my previous evaluation last week. Unfortunately this seems to be training in a pretty poor direction. Her toe was actually now starting to angle laterally and I can actually see the entire joint area of the proximal portion of the digit where is the distal portion of the digit again is no longer even in contact with the joint line. Unfortunately  there's a lot more necrotic tissue around the edge and the toe appears to be showing signs of becoming gangrenous in my pinion. I'm very concerned about were things stand at this point. She did see infectious disease and they are planning to send in a prescription for Sivextro for her and apparently this has been approved. With that being said I don't think she should avoid taking this but  at the same time I'm not sure that it's gonna be sufficient to save her toe at this point. 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 03/05/19 on evaluation today patient actually appears to be doing quite well in regard to her toe all things considering. She still has bone exposed but there appears to be much less your thing on overall the appearance of the wound and the toe itself is dramatically improved. She still does have some issues currently obviously with infection she did see vascular as well and there concerned that her blood flow to the toad. For that reason they are setting up for an angiogram next week. 03/14/19 on evaluation today patient appears to be doing very poor in regard to her toe and specifically in regard to the ulceration and the fact that she's starting to notice the toe was leaning even more towards the lateral aspect and the complete joint is visible on the proximal aspect of the joint. Nonetheless she's also noted a significant odor and the tip of the toe is turning more dark and necrotic appearing. Overall I think she is getting worse not better as far as this is concerned. For that reason I am recommending at this point that she likely needs to be seen for likely amputation. READMISSION 03/19/2021 This is a patient that we cared for in this clinic for a prolonged period of time in  2019 and 2020 with a left foot and left first toe wound. 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 ([id number]) 04/09/20 by Dr. Luana Shu. In December 2021 she had an ulcer on her right great toe as well as the fourth and fifth toes. 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 She also had an angiogram at that time and underwent angioplasty of the right anterior tibial artery. In any case she claims that the wound on the right foot is closed I did not look at this today which was probably an oversight although I think that should be done next week. After her surgery she developed a dehiscence but I do not see any follow-up of this. According to Dr. Deborra Medina last review that she was out of the area being cared for by another physician but recently came back to his attention. 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 A culture of this area showed E. coli apparently before she came back to see Dr. Luana Shu she was supposed to be receiving antibiotics but she did not really take them. Nor is she offloading this area at all. Finally her last hemoglobin A1c listed in epic was in March 2022 at 14.1 she says things are a lot better since then although I am not sure. 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 She had this wound at the time. During this admission she had E. coli sepsis a TEE was negative. Past medical history is extensive and includes type 2 diabetes with peripheral neuropathy cardiomyopathy with an ejection fraction of 33%, hypertension, hyperlipidemia chronic renal failure stage III history of substance abuse with cocaine although she  claims to be clean now verified by her mother. She is still a heavy cigarette smoker. She has a history of bipolar  disorder seizure disorder ABI in our clinic was 1.05 6/1; left midfoot in the setting of a TMA done previously. Round circular wound with a "knuckle" of protruding tissue. The problem is that the knuckle was not attached to any of the surrounding granulation and this probed proximally widely I removed a large portion of this tissue. This wound goes with considerable undermining laterally. I do not feel any bone there was no purulence but this is a deep wound. 6/8; in spite of the debridement I did last week. She arrives with a wound looking exactly the same. A protruding "knuckle" of tissue nonadherent to most of the surrounding tissue. There is considerable depth around this from 6-12 o'clock at 2.7 cm and undermining of 1 cm. This does not look overtly infected and the x-ray I did last week was negative for any osseous abnormalities. We have been using silver collagen 6/15; deep tissue culture I did last week showed moderate staph aureus and moderate Pseudomonas. This will definitely require prolonged antibiotic therapy. The pathology on the protuberant area was negative for malignancy fungus etc. 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 We have been using silver collagen. I am going to be prescribing Levaquin for 2 weeks. Her CT scan of the foot is down for 7/5 6/22; CT scan of the foot on 7 5. She says she has hardware in the left leg from her previous fracture. She is on the Levaquin for the deep tissue culture I did that showed methicillin sensitive staph aureus and Pseudomonas. I gave her a 2-week supply and she will have another week. She arrives in clinic today with the same protuberant tissue however this is nonadherent to the tissue surrounding it. I am really at a loss to explain this unless there is underlying deep tissue infection 6/29; patient presents for 1 week follow-up. She has been using collagen to the wound bed. She  reports taking her antibiotics as prescribed.She has no complaints or issues today. She denies signs of infection. 7/6; patient presents for one week followup. She has been using collagen to the wound bed. She states she is taking Levaquin however at times she is not able to keep it down. She denies signs of infection. 7/13; patient presents for 1 week follow-up. She has been using silver alginate to the wound bed. She still has nausea when taking her antibiotics. She denies signs of infection. 7/20; patient presents for 1 week follow-up. She has been using silver alginate with gentamicin cream to the wound bed. She denies any issues and has no complaints today. She denies signs of infection. 7/27; patient presents for 1 week follow-up. She continues to use silver alginate with gentamicin cream to the wound bed. She reports starting her antibiotics. She has no issues or complaints. Overall she reports stability to the wound. 8/3; patient presents for 1 week follow-up. She has been using silver alginate with gentamicin cream to the wound bed. She reports completing all antibiotics. She has no issues or complaints today. She denies signs of infection. 8/17; patient presents for 2-week follow-up. He is to use silver alginate to the wound bed. She has no issues or complaints today. She denies signs of infection. She reports her pain has improved in her foot since last clinic visit 8/24; patient presents for 1 week follow-up. She  continues to use silver alginate to the wound bed. She has no issues or complaints. She denies signs of infection. Pain is stable. 9/7; patient presents for follow-up. She missed her last week appointment due to feeling ill. She continues to use silver alginate. She has a new wound to the right lower extremity that is covered in eschar. She states It occurred over the past week and has no idea how it started. She currently denies signs of infection. Electronic  Signature(s) Signed: 06/30/2021 12:06:06 PM By: Kalman Shan DO Entered By: Kalman Shan on 06/30/2021 11:55:30 [name] ([id number]) -------------------------------------------------------------------------------- Physical Exam Details Patient Name: Heather Dear A. Date of Service: 06/30/2021 10:45 AM Medical Record Number: [id number] Patient Account Number: [id number] Date of Birth/Sex: 27-Apr-1976 (45 y.o. F) Treating RN: Dolan Amen Primary Care Provider: Raelene Bott Other Clinician: Referring Provider: Raelene Bott Treating Provider/Extender: Yaakov Guthrie in Treatment: 14 Constitutional . Psychiatric . Notes Left foot (Transmetatarsal amputation): Wound to the plantar aspect that that has granulation tissue and nonviable tissue present. There is significant callus circumferentially. Right lower extremity: To the anterior aspect there is an eschar present. No signs of infection. Electronic Signature(s) Signed: 06/30/2021 12:06:06 PM By: Kalman Shan DO Entered By: Kalman Shan on 06/30/2021 11:56:28 [name] ([id number]) -------------------------------------------------------------------------------- Physician Orders Details Patient Name: Heather Dear A. Date of Service: 06/30/2021 10:45 AM Medical Record Number: [id number] Patient Account Number: [id number] Date of Birth/Sex: Apr 28, 1976 (45 y.o. F) Treating RN: Dolan Amen Primary Care Provider: Raelene Bott Other Clinician: Referring Provider: Raelene Bott Treating Provider/Extender: Yaakov Guthrie in Treatment: 8 Verbal / Phone Orders: No Diagnosis Coding Follow-up Appointments o Return Appointment in 1 week. Bathing/ Shower/ Hygiene o Clean wound with Normal Saline or wound cleanser. Off-Loading Left Lower Extremity o Open toe surgical shoe with peg assist. o Turn and reposition every 2 hours Wound Treatment Wound #7 - Foot Wound Laterality:  Plantar, Left, Midline Cleanser: Soap and Water 3 x Per Week/30 Days Discharge Instructions: Gently cleanse wound with antibacterial soap, rinse and pat dry prior to dressing wounds Peri-Wound Care: Desitin Maximum Strength Ointment 4 (oz) 3 x Per Week/30 Days Discharge Instructions: Peri wound Topical: Gentamicin 3 x Per Week/30 Days Discharge Instructions: Apply to wound bed and tunnel area under the dressing Primary Dressing: Silvercel Small 2x2 (in/in) 3 x Per Week/30 Days Discharge Instructions: Apply to wound bed Secondary Dressing: ABD Pad 5x9 (in/in) 3 x Per Week/30 Days Discharge Instructions: Cover with ABD pad on top of dressing Secondary Dressing: Gauze 3 x Per Week/30 Days Discharge Instructions: To hold dressing in place Secured With: Kerlix Roll Sterile or Non-Sterile 6-ply 4.5x4 (yd/yd) 3 x Per Week/30 Days Discharge Instructions: Apply Kerlix as directed Wound #8 - Lower Leg Wound Laterality: Right, Anterior Cleanser: Normal Saline 1 x Per Day/30 Days Discharge Instructions: Wash your hands with soap and water. Remove old dressing, discard into plastic bag and place into trash. Cleanse the wound with Normal Saline prior to applying a clean dressing using gauze sponges, not tissues or cotton balls. Do not scrub or use excessive force. Pat dry using gauze sponges, not tissue or cotton balls. Topical: Santyl Collagenase Ointment, 30 (gm), tube 1 x Per Day/30 Days Discharge Instructions: apply nickel thick to wound bed only Secondary Dressing: [place] Dressing, 4x4 (in/in) 1 x Per Day/30 Days Discharge Instructions: Apply over dressing to secure in place. Radiology o Computed Tomography (CT) Scan - w/o contrast of left foot Patient Medications Allergies: erythromycin base  [name], [name] ([id number]) Notifications Medication Indication Start End Santyl 06/30/2021 DOSE topical 250 unit/gram ointment - Apply nickel thick daily to wound bed only Electronic  Signature(s) Signed: 06/30/2021 12:06:06 PM By: Kalman Shan DO Signed: 06/30/2021 4:14:25 PM By: Dolan Amen RN Entered By: Dolan Amen on 06/30/2021 11:38:25 [name], [name] ([id number]) -------------------------------------------------------------------------------- Prescription 06/30/2021 Patient Name: Heather Dear A. Provider: Kalman Shan MD Date of Birth: Oct 19, 1976 NPI#: [id number] Sex: F DEA#: Phone #: [phone] License #: [id number] Patient Address: [facility] [place], [name] [address] [address] [place],  [phone] Allergies erythromycin base Medication Medication: Route: Strength: Form: Santyl topical 250 unit/gram ointment Class: TOPICAL/MUCOUS MEMBR./SUBCUT. ENZYMES Dose: Frequency / Time: Indication: Apply nickel thick daily to wound bed only Number of Refills: Number of Units: 1 Thirty (30) Gram(s) Generic Substitution: Start Date: End Date: Administered at Facility: Do Not Substitute 06/30/2021 No Note to Pharmacy: wound size 3.2cmx2.5cmx0.1cm Hand Signature: Date(s): Electronic Signature(s) Signed: 06/30/2021 12:06:06 PM By: Kalman Shan DO Signed: 06/30/2021 4:14:25 PM By: Dolan Amen RN Entered By: Dolan Amen on 06/30/2021 11:38:25 Heather Dear A. ([id number]) --------------------------------------------------------------------------------  Problem List Details Patient Name: Heather Dear A. Date of Service: 06/30/2021 10:45 AM Medical Record Number: [id number] Patient Account Number: [id number] Date of Birth/Sex: [birth year] (45 y.o. F) Treating RN: Dolan Amen Primary Care Provider: Raelene Bott Other Clinician: Referring Provider: Raelene Bott Treating Provider/Extender: Yaakov Guthrie in Treatment: 14 Active Problems ICD-10 Encounter Code Description Active Date  MDM Diagnosis 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 03/19/2021 No Yes severity S81.801A Unspecified open wound, right lower leg, initial encounter 06/30/2021 No Yes E11.621 Type 2 diabetes mellitus with foot ulcer 03/19/2021 No Yes E11.42 Type 2 diabetes mellitus with diabetic polyneuropathy 03/19/2021 No Yes Inactive Problems Resolved Problems Electronic Signature(s) Signed: 06/30/2021 12:06:06 PM By: Kalman Shan DO Entered By: Kalman Shan on 06/30/2021 12:04:45 [name], [name] ([id number]) -------------------------------------------------------------------------------- Progress Note Details Patient Name: Heather Dear A. Date of Service: 06/30/2021 10:45 AM Medical Record Number: [id number] Patient Account Number: [id number] Date of Birth/Sex: Aug 16, 1976 (45 y.o. F) Treating RN: Dolan Amen Primary Care Provider: Raelene Bott Other Clinician: Referring Provider: Raelene Bott Treating Provider/Extender: Yaakov Guthrie in Treatment: 14 Subjective Chief Complaint Information obtained from Patient Left great toe ulcer 03/19/2021; patient referred by Dr. Luana Shu who has been looking after her left foot for quite a period of time for review of a nonhealing area in the left midfoot History of Present Illness (HPI) 01/18/18-She is here for initial evaluation of the left great toe ulcer. She is a poor historian in regards to timeframe in detail. She states approximately 4 weeks ago she lacerated her toe on something in the house. She followed up with her primary care who placed her on Bactrim and ultimately a second dose of Bactrim prior to coming to wound clinic. She states she has been treating the toe with peroxide, Betadine and a Band-Aid. She did not check her blood sugar this morning but checked it yesterday morning it was 327; she is unaware of a recent A1c and there are no current records. She saw Dr. she would've orthopedics  last week for an old injury to the left ankle, she states he did not see her toe, nor did she bring it to his attention. She smokes approximately 1 pack cigarettes a day. Her social situation is concerning,  she arrives this morning with her mother who appears extremely intoxicated/under the influence; her mother was asked to leave the room and be monitored by the patient's grandmother. The patient's aunt then accompanied the patient and the room throughout the rest of the appointment. We had a lengthy discussion regarding the deleterious effects of uncontrolled hyperglycemia and smoking as it relates to wound healing and overall health. She was strongly encouraged to decrease her smoking and get her diabetes under better control. She states she is currently on a diet and has cut down her [facility] consumption. The left toe is erythematous, macerated and slightly edematous with malodor present. The edema in her left foot is below her baseline, there is no erythema streaking. We will treat her with Santyl, doxycycline; we have ordered and xray, culture and provided a Peg assist surgical shoe and cultured the wound. 01/25/18-She is here in follow-up evaluation for a left great toe ulcer and presents with an abscess to her suprapubic area. She states her blood sugars remain elevated, feeling "sick" and if levels are below 250, but she is trying. She has made no attempt to decrease her smoking stating that we "can't take away her food in her cigarettes". She has been compliant with offloading using the PEG assist you. She is using Santyl daily. the culture obtained last week grew staph aureus and Enterococcus faecalis; continues on the doxycycline and Augmentin was added on Monday. The suprapubic area has erythema, no femoral variation, purple discoloration, minimal induration, was accessed with a cotton tip applicator with sanguinopurulent drainage, this was cultured, I suspect the current antibiotic  treatment will cover and we will not add anything to her current treatment plan. She was advised to go to urgent care or ER with any change in redness, induration or fever. 02/01/18-She is here in follow-up evaluation for left great toe ulcers and a new abdominal abscess from last week. She was able to use packing until earlier this week, where she "forgot it was there". She states she was feeling ill with GI symptoms last week and was not taking her antibiotic. She states her glucose levels have been predominantly less than 200, with occasional levels between 200-250. She thinks this was contributing to her GI symptoms as they have resolved without intervention. There continues to be significant laceration to left toe, otherwise it clinically looks stable/improved. There is now less superficial opening to the lateral aspect of the great toe that was residual blister. We will transition to [facility] to all wounds, she will continue her Augmentin. If there is no change or deterioration next week for reculture. 02/08/18-She is here in follow-up evaluation for left great toe ulcer and abdominal ulcer. There is an improvement in both wounds. She has been wrapping her left toe with coban, not by our direction, which has created an area of discoloration to the medial aspect; she has been advised to NOT use coban secondary to her neuropathy. She states her glucose levels have been high over this last week ranging from 200-350, she continues to smoke. She admits to being less compliant with her offloading shoe. We will continue with same treatment plan and she will follow-up next week. 02/15/18-She is here in follow-up evaluation for left great toe ulcer and abdominal ulcer. The abdominal ulcer is epithelialized. The left great toe ulcer is improved and all injury from last week using the Coban wrap is resolved, the lateral ulcer is healed. She admits to noncompliance with wearing offloading shoe and  admits  to glucose levels being greater than 300 most of the week. She continues to smoke and expresses no desire to quit. There is one area medially that probes deeper than it has historically, erythema to the toe and dorsal foot has consistently waxed and waned. There is no overt signs of cellulitis or infection but we will culture the wound for any occult infection given the new area of depth and erythema. We will hold off on sensitivities for initiation of antibiotic therapy. 02/22/18-She is here in follow up evaluation for left great toe ulcer. There is overall significant improvement in both wound appearance, erythema and edema with changes made last week. She was not initiated on antibiotic therapy. Culture obtained last week showed oxacillin sensitive staph aureus, sensitive to clindamycin. Clindamycin has been called into the pharmacy but she has been instructed to hold off on initiation secondary to overall clinical improvement and her history of antibiotic intolerance. She has been instructed to contact the clinic with any noted changes/deterioration and the wound, erythema, edema and/or pain. She will follow-up next week. She continues to smoke and her glucose levels remain elevated >250; she admits to compliance with offloading shoe 03/01/18 on evaluation today patient appears to be doing fairly well in regard to her left first toe ulcer. She has been tolerating the dressing changes with the [facility] Dressing without complication and overall this has definitely showed signs of improvement according to records as well is what the patient tells me today. I'm very pleased in that regard. She is having no pain today 03/08/18 She is here for follow up evaluation of a left great toe ulcer. She remains non-compliant with glucose control and smoking cessation; glucose levels consistently >200. She states that she got new shoe inserts/peg assist. She admits to compliance with offloading. Since my  last evaluation there is significant improvement. We will switch to prisma at this time and she will follow up next week. She is noted to be tachycardic at this appointment, heart rate 120s; she has a history of heart rate 70-130 according to our records. She admits to extreme agitation r/t personal issues; she was advised to monitor her heartrate and contact her physician if it does not return to a more normal range (<100). She takes cardizem twice daily. 03/15/18-She is here in follow-up evaluation for left great toe ulcer. She remains noncompliant with glucose control and smoking cessation. She admits to compliance with wearing offloading shoe. The ulcer is improved/stable and we will continue with the same treatment plan and she will follow-up next week 03/22/18-She is here for evaluation for left great toe ulcer. There continues to be significant improvement despite recurrent hyperglycemia (over 500 yesterday) and she continues to smoke. She has been compliant with offloading and we will continue with same treatment plan and she will follow-up next week. [name], [name] ([id number]) 03/29/18-She is here for evaluation for left great toe ulcer. Despite continuing to smoke and uncontrolled diabetes she continues to improve. She is compliant with offloading shoe. We will continue with the same treatment plan and she will follow-up next week 04/05/18- She is here in follow up evaluation for a left great toe ulcer; she presents with small pustule to left fifth toe (resembles ant bite). She admits to compliance with wearing offloading shoe; continues to smoke or have uncontrolled blood glucose control. There is more callus than usual with evidence of bleeding; she denies known trauma. 04/12/18-She is here for evaluation of left great toe ulcer.  Despite noncompliance with glycemic control and smoking she continues to make improvement. She continues to wear offloading shoe. The pustule, that was identified  last week, to the left fifth toe is resolved. She will follow-up in 2 weeks 05/03/18-she is seen in follow-up evaluation for a left great toe ulcer. She is compliant with offloading, otherwise noncompliant with glycemic control and smoking. She has plateaued and there is minimal improvement noted. We will transition to [facility], replaced the insert to her surgical shoe and she will follow-up in one week 05/10/18- She is here in follow up evaluation for a left great toe ulcer. It appears stable despite measurement change. We will continue with same treatment plan and follow up next week. 05/24/18-She is seen in follow-up evaluation for a left great toe ulcer. She remains compliant with offloading, has made significant improvement in her diet, decreasing the amount of sugar/soda. She said her recent A1c was 10.9 which is lower than. She did see a diabetic nutritionist/educator yesterday. She continues to smoke. We will continue with the same treatment plan and she'll follow-up next week. 05/31/18- She is seen in follow-up evaluation for left great toe ulcer. She continues to remain compliant with offloading, continues to make improvement in her diet, increasing her water and decreasing the amount of sugar/soda. She does continue to smoke with no desire to quit. We will apply Prisma to the depth and Hydrofera Blue over. We have not received insurance authorization for oasis. She will follow up next week. 06/07/18-She is seen in follow-up evaluation for left great toe ulcer. It has stalled according to today's measurements although base appears stable. She says she saw a diabetic educator yesterday; her average blood sugars are less than 300 which is an improvement for her. She continues to smoke and states "that's my next step" She continues with water over soda. We will order for xray, culture and reinstate ace wrap compression prior to placing apligraf for next week. She is voicing no complaints or  concerns. Her dressing will change to iodoflex over the next week in preparation for apligraf. 06/14/18-She is seen in follow-up evaluation for left great toe ulcer. Plain film x-ray performed last week was negative for osteomyelitis. Wound culture obtained last week grew strep B and OSSA; she is initiated on keflex and cefdinir today; there is erythema to the toe which could be from ace wrap compression, she has a history of wrapping too tight and has has been encouraged to maintain ace wraps that we place today. We will hold off on application of apligraf today, will apply next week after antibiotic therapy has been initiated. She admits today that she has resumed taking a shower with her foot/toe submerged in water, she has been reminded to keep foot/toe out of the bath water. She will be seen in follow up next week 06/21/18-she is seen in follow-up evaluation for left great toe ulcer. She is tolerating antibiotic therapy with no GI disturbance. The wound is stable. Apligraf was applied today. She has been decreasing her smoking, only had 4 cigarettes yesterday and 1 today. She continues being more compliant in diabetic diet. She will follow-up next week for evaluation of site, if stable will remove at 2 weeks. 06/28/18- She is here in follow up evalution. Apligraf was placed last week, she states the dressing fell off on Tuesday and she was dressing with hydrofera blue. She is healed and will be discharged from the clinic today. She has been instructed to continue with smoking  cessation, continue monitoring glucose levels, offloading for an additional 4 weeks and continue with hydrofera blue for additional two weeks for any possible microscopic opening. Readmission: 08/07/18 on evaluation today patient presents for reevaluation concerning the ulcer of her right great toe. She was previously discharged on 06/28/18 healed. Nonetheless she states that this began to show signs of drainage she subsequently  went to her primary care provider. Subsequently an x-ray was performed on 08/01/18 which was negative. The patient was also placed on antibiotics at that time. Fortunately they should have been effective for the infection. Nonetheless she's been experiencing some improvement but still has a lot of drainage coming from the wound itself. 08/14/18 on evaluation today patient's wound actually does show signs of improvement in regard to the erythema at this point. She has completed the antibiotics. With that being said we did discuss the possibility of placing her in a total contact cast as of today although I think that I may want to give this just a little bit more time to ensure nothing recurrence as far as her infection is concerned. I do not want to put in the cast and risk infection at that time if things are not completely resolved. With that being said she is gonna require some debridement today. 08/21/18 on evaluation today patient actually appears to be doing okay in regard to her toe ulcer. She's been tolerating the dressing changes without complication. With that being said it does appear that she is ready and in fact I think it's appropriate for [place] to go ahead and initiate the total contact cast today. Nonetheless she will require some sharp debridement to prepare the wound for application. Overall I feel like things have been progressing well but we do need to do something to get this to close more readily. 08/24/18 patient seen today for reevaluation after having had the total contact cast applied on Tuesday. She seems to have done very well the wound appears to be doing great and overall I'm pleased with the progress that she's made. There were no abnormal areas of rubbing from the cast on her lower extremity. 08/30/18 on evaluation today patient actually appears to be completely healed in regard to her plantar toe ulcer. She tells me at this point she's been having a lot of issues with the  cast. She almost fell a couple of times the state shall the step of her dog a couple times as well. This is been a very frustrating process for her other nonetheless she has completely healed the wound which is excellent news. Overall there does not appear to be the evidence of infection at this time which is great news. 09/11/18 evaluation today patient presents for follow-up concerning her great toe ulcer on the left which has unfortunately reopened since I last saw her which was only a couple of weeks ago. Unfortunately she was not able to get in to get the shoe and potentially the AFO that's gonna be necessary due to her left foot drop. She continues with offloading shoe but this is not enough to prevent her from reopening it appears. When we last had her in the total contact cast she did well from a healing standpoint but unfortunately the wound reopened as soon as she came out of the cast within just a couple of weeks. Right now the biggest concern is that I do believe the foot drop is leading to the issue and this is gonna continue to be an issue unfortunately until  we get things under control as far as the walking anomaly is concerned with the foot drop. This is also part of the reason why she falls on a regular basis. I just do not believe that is gonna be safe for [place] to reinitiate the total contact cast as last time we had this on she fell 3 times one week which is definitely not normal for her. 09/18/18 upon evaluation today the patient actually appears to be doing about the same in regard to her toe ulcer. She did not contact [facility] as I asked her to even though I had given her the prescription. In fact she actually states that she has no idea where the prescription is. She did apparently call [facility] and they told her that all she needed to do was bring the prescription in order to be able to be seen and work on getting the AFO for her left foot. With all that being said she still does  not have an appointment and I'm not sure were things stand that regard. I will give her a new prescription today in order to contact them to get this set up. AERIE, DELLACAMERA A. ([id number]) 09/25/18 on evaluation today patient actually appears to be doing about the same in regard to her toes ulcer. She does have a small areas which seems to have a lot of callous buildup around the edge of the wound which is going to need sharp debridement today. She still is waiting to be scheduled for evaluation with [facility] for possibility of an AFO. She states there supposed to call her tomorrow to get this set up. Unfortunately it does appear that her foot specifically the toe area is showing signs of erythema. There does not appear to be any systemic infection which is in these good news. 10/02/18 on evaluation today patient actually appears to be doing about the same in regard to her toe ulcer. This really has not done too well although it's not significantly larger it's also not significantly smaller. She has been tolerating the dressing changes without complication. She actually has her appointment with [facility] and [place] tomorrow to hopefully be measured for obtaining and AFO splint. I think this would be helpful preventing this from reoccurring. We had contemplated starting the cast this week although to be honest I am reluctant to do that as she's been having nausea, vomiting, and seizure activity over the past three days. She has a history of seizures and have been told is nothing that can be done for these. With that being said I do believe that along with the seizures have the nausea vomiting which upon further questioning doesn't seem to be the normal for her and makes me concerned for the possibility of infection or something else going on. I discussed this with the patient and her mother during the office visit today. I do not feel the wound is effective but maybe something else. The responses this was  "this just happens to her at times and we don't know why". They did not seem to be interested in going to the hospital to have this checked out further. 10/09/18 on evaluation today patient presents for follow-up concerning her ongoing toe ulcer. She has been tolerating the dressing changes without complication. Fortunately there does not appear to be any evidence of infection which is great news however I do think that the patient would benefit from going ahead for with the total contact cast. She's actually in a wheelchair today she tells  me that she will use her walker if we initiate the cast. I was very specific about the fact that if we were gonna do the cast I wanted to make sure that she was using the walker in order to prevent any falls. She tells me she does not have stairs that she has to traverse on a regular basis at her home. She has not had any seizures since last week again that something that happens to her often she tells me she did talk to [employer] and they said that it may take up to three weeks to get the brace approved for her. Hopefully that will not take that long but nonetheless in the meantime I do think the cast could be of benefit. 10/12/18 on evaluation today patient appears to be doing rather well in regard to her toe ulcer. It's just been a few days and already this is significantly improved both as far as overall appearance and size. Fortunately there's no sign of infection. She is here for her first obligatory cast change. 10/19/18 Seen today for follow up and management of left great toe ulcer. Wound continues to show improvement. Noted small open area with seroussang drainage with palpation. Denies any increased pain or recent fevers during visit. She will continue calcium alginate with offloading shoe. Denies any questions or concerns during visit. 10/26/18 on evaluation today patient appears to be doing about the same as when I last saw her in regard to her wound  bed. Fortunately there does not appear to be any signs of infection. Unfortunately she continues to have a breakdown in regard to the toe region any time that she is not in the cast. It takes almost no time at all for this to happen. Nonetheless she still has not heard anything from the brace being made by [employer] as to when exactly this will be available to her. Fortunately there is no signs of infection at this time. 10/30/18 on evaluation today patient presents for application of the total contact cast as we just received him this morning. Fortunately we are gonna be able to apply this to her today which is great news. She continues to have no significant pain which is good news. Overall I do feel like things have been improving while she was the cast is when she doesn't have a cast that things get worse. She still has not really heard anything from [place] regarding her brace. 11/02/18 upon evaluation today patient's wound already appears to be doing significantly better which is good news. Fortunately there does not appear to be any signs of infection also good news. Overall I do think the total contact cast as before is helping to heal this area unfortunately it's just not gonna likely keep the area closed and healed without her getting her brace at least. Again the foot drop is a significant issue for her. 11/09/18 on evaluation today patient appears to be doing excellent in regard to her toe ulcer which in fact is completely healed. Fortunately we finally got the situation squared away with the paperwork which was needed to proceed with getting her brace approved by Medicaid. I have filled that out unfortunately that information has been sent to the orthopedic office that I worked at 2 1/2 years ago and not tired Current wound care measures. Fortunately she seems to be doing very well at this time. 11/23/18 on evaluation today patient appears to be doing More Poorly Compared to Last Time I Saw Her.  At  That Point She Had Completely Healed. Currently she is continuing to have issues with reopening. She states that she just found out that the brace was approved through Medicaid now she just has to go get measured in order to have this fitted for her and then made. Subsequently she does not have an appointment for this yet that is going to complicate things we obviously cannot put her back in the cast if we do not have everything measured because they're not gonna be able to measure her foot while she is in the cast. 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 Obviously this is unfortunate and does have me somewhat worried as well. 11/30/18 on evaluation today patient's toe ulcer actually appears to be doing fairly well. The good news is she will be getting her brace in the shoes next week on Wednesday. Hopefully we will be able to get this to heal without having to go back in the cast however she may need the cast in order to get the wound completely heal and then go from there. Fortunately there's no signs of infection at this time. 12/07/18 on evaluation today patient fortunately did receive her brace and she states she could tell this definitely makes her walk better. With that being said she's been having issues with her toe where she noticed yesterday there was a lot of tissue that was loosing off this appears to be much larger than what it was previous. She also states that her leg has been read putting much across the top of her foot just about the ankle although this seems to be receiving somewhat. The total area is still red and appears to be someone infected as best I can tell. She is previously taken Bactrim and that may be a good option for her today as well. We are gonna see what I wound culture shows as well and I think that this is definitely appropriate. With that being said outside of the  culture I still need to initiate something in the interim and that's what I'm gonna go ahead and select Bactrim is a good option for her. 12/14/18 on evaluation today patient appears to be doing better in regard to her left great toe ulcer as compared to last week's evaluation. There's still some erythema although this is significantly improved which is excellent news. Overall I do believe that she is making good progress is still gonna take some time before she is where I would like her to be from the standpoint of being able to place her back into the total contact cast. Hopefully we will be where we need to be by next week. 12/21/18 on evaluation today patient actually appears to be doing poorly in regard to her toe ulcer. She's been tolerating the dressing changes without complication. Fortunately there's no signs of systemic infection although she does have a lot of drainage from the toe ulcer and this does [name], [name] ([id number]) seem to be causing some issues at this point. She does have erythema on the distal portion of her toe that appears to be likely cellulitis. 12/28/18 on evaluation today patient actually appears to be doing a little better in my pinion in regard to her toe ulcer. With that being said she still does have some evidence of infection at this time and for her culture she had both E. coli as well  as enterococcus as organisms noted on evaluation. For that reason I think that though the Keflex likely has treated the E. coli rather well this has really done nothing for the enterococcus. We are going to have to initiate treatment for this specifically. 01/04/19 on evaluation today patient's toe actually appears to be doing better from the standpoint of infection. She currently would like to see about putting the cash back on I think that this is appropriate as long as she takes care of it and keeps it from getting wet. She is gonna have some drainage we can definitely pass this  up with Drawtex and alginate to try to prevent as much drainage as possible from causing the problems. With that being said I do want to at least try her with the cast between now and Tuesday. If there any issues we can't continue to use it then I will discontinue the use of the cast at that point. 01/08/19 on evaluation today patient actually appears to be doing very well as far as her foot ulcer specifically the great toe on the left is concerned. She did have an area of rubbing on the medial aspect of her left ankle which again is from the cast. Fortunately there's no signs of infection at this point in this appears to be a very slight skin breakdown. The patient tells me she felt it rubbing but didn't think it was that bad. Fortunately there is no signs of active infection at this time which is good news. No fevers, chills, nausea, or vomiting noted at this time. 01/15/19 on evaluation today patient actually appears to be doing well in regard to her toe ulcer. Again as previous she seems to do well and she has the cast on which indicates to me that during the time she doesn't have a cast on she's putting way too much pressure on this region. Obviously I think that's gonna be an issue as with the current national emergency concerning the Covid-19 Virus it has been recommended that we discontinue the use of total contact casting by the chief medical officer of our company, Dr. Simona Huh. The reasoning is that if a patient becomes sick and cannot come into have the cast removed they could not just leave this on for an additional two weeks. Obviously the hospitals also do not want to receive patient's who are sick into the emergency department to potentially contaminate the region and spread the Covid-19 Virus among other sick individuals within the hospital system. Therefore at this point we are suspending the use of total contact cast until the current emergency subsides. This was all discussed with the  patient today as well. 01/22/19 on evaluation today patient's wound on her left great toe appears to be doing slightly worse than previously noted last week. She tells me that she has been on this quite a bit in fact she tells me she's been awake for 38 straight hours. This is due to the fact that she's having to care for grandparents because nobody else will. She has been taking care of them for five the last seven days since I've seen her they both have dementia his is from a stroke and her grandmother's was progressive. Nonetheless she states even her mom who knows her condition and situation has only help two of those days to take care of them she's been taking care of the rest. Fortunately there does not appear to be any signs of active infection in regard to her toe  at this point although obviously it doesn't look as good as it did previous. I think this is directly related to her not taking off the pressure and friction by way of taking things easy. Though I completely understand what's going on. 01/29/19 on evaluation today patient's tools are actually appears to be showing some signs of improvement today compared to last week's evaluation as far as not necessarily the overall size of the wound but the fact that she has some new skin growth in between the two ends of the wound opening. Overall I feel like she has done well she states that she had a family member give her what sounds to be a CAM walker boot which has been helpful as well. 02/05/19 on evaluation today patient's wound bed actually appears to be doing significantly better in regard to her overall appearance of the size of the wound. With that being said she is still having an issue with offloading efficiently enough to get this to close. Apparently there is some signs of infection at this point as well unfortunately. Previously she's done well of Augmentin I really do not see anything that needs to be culture currently but there theme  and cellulitis of the foot that I'm seeing I'm gonna go ahead and place her on an antibiotic today to try to help clear this up. 02/12/2019 on evaluation today patient actually appears to be doing poorly in regard to her overall wound status. She tells me she has been using her offloading shoe but actually comes in today wearing her tennis shoe with the AFO brace. Again as I previously discussed with her this is really not sufficient to allow the area to heal appropriately. Nonetheless she continues to be somewhat noncompliant and I do wonder based on what she has told my nurse in the past as to whether or not a good portion of this noncompliance may be recreational drug and alcohol related. She has had a history of heroin overdose and this was fairly recently in the past couple of months that have been seeing her. Nonetheless overall I feel like her wound looks significantly worse today compared to what it was previous. She still has significant erythema despite the Augmentin I am not sure that this is an appropriate medication for her infection I am also concerned that the infection may have gone down into her bone. 02/19/19 on evaluation today patient actually appears to be doing about the same in regard to her toe ulcer. Unfortunately she continues to show signs of bone exposure and infection at this point. There does not appear to be any evidence of worsening of the infection but I'm also not really sure that it's getting significantly better. She is on the Augmentin which should be sufficient for the Staphylococcus aureus infection that she has at this point. With that being said she may need IV antibiotics to more appropriately treat this. We did have a discussion today about hyperbaric option therapy. 02/28/19 on evaluation today patient actually appears to be doing much worse in regard to the wound on her left great toe as compared to even my previous evaluation last week. Unfortunately this  seems to be training in a pretty poor direction. Her toe was actually now starting to angle laterally and I can actually see the entire joint area of the proximal portion of the digit where is the distal portion of the digit again is no longer even in contact with the joint line. Unfortunately there's a lot  more necrotic tissue around the edge and the toe appears to be showing signs of becoming gangrenous in my pinion. I'm very concerned about were things stand at this point. She did see infectious disease and they are planning to send in a prescription for Sivextro for her and apparently this has been approved. With that being said I don't think she should avoid taking this but at the same time I'm not sure that it's gonna be sufficient to save her toe at this point. 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 03/05/19 on evaluation today patient actually appears to be doing quite well in regard to her toe all things considering. She still has bone exposed but there appears to be much less your thing on overall the appearance of the wound and the toe itself is dramatically improved. She still does have some issues currently obviously with infection she did see vascular as well and there concerned that her blood flow to the toad. For that reason they are setting up for an angiogram next week. 03/14/19 on evaluation today patient appears to be doing very poor in regard to her toe and specifically in regard to the ulceration and the fact that she's starting to notice the toe was leaning even more towards the lateral aspect and the complete joint is visible on the proximal aspect of the joint. Nonetheless she's also noted a significant odor and the tip of the toe is turning more dark and necrotic  appearing. Overall I think she is getting worse not better as far as this is concerned. For that reason I am recommending at this point that she likely needs to be seen for likely amputation. [name], [name] ([id number]) READMISSION 03/19/2021 This is a patient that we cared for in this clinic for a prolonged period of time in 2019 and 2020 with a left foot and left first toe wound. 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 Luana Shu. In December 2021 she had an ulcer on her right great toe as well as the fourth and fifth toes. 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 She also had an angiogram at that time and underwent angioplasty of the right anterior tibial artery. In any case she claims that the wound on the right foot is closed I did not look at this today which was probably an oversight although I think that should be done next week. After her surgery she developed a dehiscence but I do not see any follow-up of this. According to Dr. Deborra Medina last review that she was out of the area being cared for by another physician but recently came back to his attention. 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 A culture of this area showed E. coli apparently before she came back to see Dr. Luana Shu she was supposed to be receiving antibiotics but she did not really take them. Nor is she offloading this area at all. Finally her last hemoglobin A1c listed in epic was in March 2022 at 14.1 she says things are a lot better since then although I am not sure. She was hospitalized in  March with metabolic multifactorial encephalopathy. 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 She had this wound at the time. During this admission she had E. coli sepsis a TEE was negative. Past medical history is extensive and includes type 2 diabetes  with peripheral neuropathy cardiomyopathy with an ejection fraction of 33%, hypertension, hyperlipidemia chronic renal failure stage III history of substance abuse with cocaine although she claims to be clean now verified by her mother. She is still a heavy cigarette smoker. She has a history of bipolar disorder seizure disorder ABI in our clinic was 1.05 6/1; left midfoot in the setting of a TMA done previously. Round circular wound with a "knuckle" of protruding tissue. The problem is that the knuckle was not attached to any of the surrounding granulation and this probed proximally widely I removed a large portion of this tissue. This wound goes with considerable undermining laterally. I do not feel any bone there was no purulence but this is a deep wound. 6/8; in spite of the debridement I did last week. She arrives with a wound looking exactly the same. A protruding "knuckle" of tissue nonadherent to most of the surrounding tissue. There is considerable depth around this from 6-12 o'clock at 2.7 cm and undermining of 1 cm. This does not look overtly infected and the x-ray I did last week was negative for any osseous abnormalities. We have been using silver collagen 6/15; deep tissue culture I did last week showed moderate staph aureus and moderate Pseudomonas. This will definitely require prolonged antibiotic therapy. The pathology on the protuberant area was negative for malignancy fungus etc. 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 We have been using silver collagen. I am going to be prescribing Levaquin for 2 weeks. Her CT scan of the foot is down for 7/5 6/22; CT scan of the foot on 7 5. She says she has hardware in the left leg from her previous fracture. She is on the Levaquin for the deep tissue culture I did that showed methicillin sensitive staph aureus and Pseudomonas. I gave her a 2-week supply and she will have another week.  She arrives in clinic today with the same protuberant tissue however this is nonadherent to the tissue surrounding it. I am really at a loss to explain this unless there is underlying deep tissue infection 6/29; patient presents for 1 week follow-up. She has been using collagen to the wound bed. She reports taking her antibiotics as prescribed.She has no complaints or issues today. She denies signs of infection. 7/6; patient presents for one week followup. She has been using collagen to the wound bed. She states she is taking Levaquin however at times she is not able to keep it down. She denies signs of infection. 7/13; patient presents for 1 week follow-up. She has been using silver alginate to the wound bed. She still has nausea when taking her antibiotics. She denies signs of infection. 7/20; patient presents for 1 week follow-up. She has been using silver alginate with gentamicin cream to the wound bed. She denies any issues and has no complaints today. She denies signs of infection. 7/27; patient presents for 1 week follow-up. She continues to use silver alginate with gentamicin cream to the wound bed. She reports starting her antibiotics. She has no issues or complaints. Overall she reports stability to the wound. 8/3; patient presents for 1 week follow-up. She has been using silver alginate with gentamicin cream to the wound  bed. She reports completing all antibiotics. She has no issues or complaints today. She denies signs of infection. 8/17; patient presents for 2-week follow-up. He is to use silver alginate to the wound bed. She has no issues or complaints today. She denies signs of infection. She reports her pain has improved in her foot since last clinic visit 8/24; patient presents for 1 week follow-up. She continues to use silver alginate to the wound bed. She has no issues or complaints. She denies signs of infection. Pain is stable. 9/7; patient presents for follow-up. She missed  her last week appointment due to feeling ill. She continues to use silver alginate. She has a new wound to the right lower extremity that is covered in eschar. She states It occurred over the past week and has no idea how it started. She currently denies signs of infection. Patient History Information obtained from Patient. Social History Current every day smoker, Marital Status - Single, Alcohol Use - Never, Drug Use - Prior History, Caffeine Use - Daily. Medical History Ear/Nose/Mouth/Throat Patient has history of Chronic sinus problems/congestion, Middle ear problems Hematologic/Lymphatic Patient has history of Anemia Respiratory Patient has history of Chronic Obstructive Pulmonary Disease (COPD) Cardiovascular Patient has history of Congestive Heart Failure - EF 33% Endocrine Patient has history of Type II Diabetes Genitourinary Patient has history of End Stage Renal Disease Integumentary (Skin) Bonds, Shahad A. ([id number]) Patient has history of History of pressure wounds Neurologic Patient has history of Neuropathy - feel and legs Objective Constitutional Vitals Time Taken: 11:11 AM, Height: 69 in, Weight: 185 lbs, BMI: 27.3, Temperature: 98.6 F, Pulse: 70 bpm, Respiratory Rate: 18 breaths/min, Blood Pressure: 92/69 mmHg. General Notes: Left foot (Transmetatarsal amputation): Wound to the plantar aspect that that has granulation tissue and nonviable tissue present. There is significant callus circumferentially. Right lower extremity: To the anterior aspect there is an eschar present. No signs of infection. Integumentary (Hair, Skin) Wound #7 status is Open. Original cause of wound was Gradually Appeared. The date acquired was: 08/07/2020. The wound has been in treatment 14 weeks. The wound is located on the [place]. The wound measures 2.3cm length x 3cm width x 1cm depth; 5.419cm^2 area and 5.419cm^3 volume. There is Fat Layer (Subcutaneous Tissue)  exposed. There is no tunneling or undermining noted. There is a medium amount of serosanguineous drainage noted. Foul odor after cleansing was noted. The wound margin is thickened. There is medium (34-66%) red, pink, hyper - granulation within the wound bed. There is a medium (34-66%) amount of necrotic tissue within the wound bed including Adherent Slough. Wound #8 status is Open. Original cause of wound was Gradually Appeared. The date acquired was: 06/30/2021. The wound is located on the Right,Anterior Lower Leg. The wound measures 3.2cm length x 2.5cm width x 0.1cm depth; 6.283cm^2 area and 0.628cm^3 volume. There is Fat Layer (Subcutaneous Tissue) exposed. There is no tunneling or undermining noted. There is a none present amount of drainage noted. There is no granulation within the wound bed. There is a large (67-100%) amount of necrotic tissue within the wound bed including Eschar. Assessment Active Problems ICD-10 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ity Unspecified open wound, right lower leg, initial encounter Type 2 diabetes mellitus with foot ulcer Type 2 diabetes mellitus with diabetic polyneuropathy Patient's left foot wound has shown decline in appearance and size since last clinic visit. Unfortunately she missed her appointment last week. It is unclear if patient is able  to offload this area. She has more callus today. I debrided some nonviable tissue. I recommended aggressive offloading and continuing with gentamicin ointment and silver alginate. At this time I would like to get a repeat image. She had a CT scan in July that did not show signs of osteomyelitis. Since patient's wound has shown no improvement since her scan and continues to report chronic pain I would like to repeat the CT. Patient now has a new wound to her right lower extremity. This is completely eschared and unable to be removed in office. I crosshatched the area and I  recommended Santyl daily. The etiology of this wound is unknown. No obvious signs of infection on exam. Procedures Wound #7 Pre-procedure diagnosis of Wound #7 is a Pressure Ulcer located on the Left,Midline,Plantar Foot . There was a Selective/Open Wound Non-Viable Tissue Debridement with a total area of 9 sq cm performed by Kalman Shan, MD. With the following instrument(s): Blade, and Curette to remove Non-Viable tissue/material. Material removed includes Callus. A time out was conducted at 11:32, prior to the start of the procedure. There was no bleeding. The procedure was tolerated well. Post Debridement Measurements: 2.3cm length x 3cm width x 1cm depth; 5.419cm^3 volume. Post debridement Stage noted as Category/Stage II. Character of Wound/Ulcer Post Debridement is stable. Post procedure Diagnosis Wound #7: Same as Pre-Procedure Stai, [place] ([id number]) Plan Follow-up Appointments: Return Appointment in 1 week. Bathing/ Shower/ Hygiene: Clean wound with Normal Saline or wound cleanser. Off-Loading: Open toe surgical shoe with peg assist. Turn and reposition every 2 hours Radiology ordered were: Computed Tomography (CT) Scan - w/o contrast of left foot The following medication(s) was prescribed: Santyl topical 250 unit/gram ointment Apply nickel thick daily to wound bed only starting 06/30/2021 WOUND #7: - Foot Wound Laterality: Plantar, Left, Midline Cleanser: Soap and Water 3 x Per Week/30 Days Discharge Instructions: Gently cleanse wound with antibacterial soap, rinse and pat dry prior to dressing wounds Peri-Wound Care: Desitin Maximum Strength Ointment 4 (oz) 3 x Per Week/30 Days Discharge Instructions: Peri wound Topical: Gentamicin 3 x Per Week/30 Days Discharge Instructions: Apply to wound bed and tunnel area under the dressing Primary Dressing: Silvercel Small 2x2 (in/in) 3 x Per Week/30 Days Discharge Instructions: Apply to wound bed Secondary Dressing: ABD Pad  5x9 (in/in) 3 x Per Week/30 Days Discharge Instructions: Cover with ABD pad on top of dressing Secondary Dressing: Gauze 3 x Per Week/30 Days Discharge Instructions: To hold dressing in place Secured With: Kerlix Roll Sterile or Non-Sterile 6-ply 4.5x4 (yd/yd) 3 x Per Week/30 Days Discharge Instructions: Apply Kerlix as directed WOUND #8: - Lower Leg Wound Laterality: Right, Anterior Cleanser: Normal Saline 1 x Per Day/30 Days Discharge Instructions: Wash your hands with soap and water. Remove old dressing, discard into plastic bag and place into trash. Cleanse the wound with Normal Saline prior to applying a clean dressing using gauze sponges, not tissues or cotton balls. Do not scrub or use excessive force. Pat dry using gauze sponges, not tissue or cotton balls. Topical: Santyl Collagenase Ointment, 30 (gm), tube 1 x Per Day/30 Days Discharge Instructions: apply nickel thick to wound bed only Secondary Dressing: [place] Dressing, 4x4 (in/in) 1 x Per Day/30 Days Discharge Instructions: Apply over dressing to secure in place. 1. Left foot wound silver alginate and gentamicin ointment 2. CT scan of left foot 3. Santyl to the right lower extremity wound 4. Follow-up in 1 week Electronic Signature(s) Signed: 06/30/2021 12:06:06 PM By: Heber Addison,  Janett Billow DO Entered By: Kalman Shan on 06/30/2021 12:05:04 [name] ([id number]) -------------------------------------------------------------------------------- ROS/PFSH Details Patient Name: Heather Dear A. Date of Service: 06/30/2021 10:45 AM Medical Record Number: [id number] Patient Account Number: [id number] Date of Birth/Sex: May 10, 1976 (45 y.o. F) Treating RN: Dolan Amen Primary Care Provider: Raelene Bott Other Clinician: Referring Provider: Raelene Bott Treating Provider/Extender: Yaakov Guthrie in Treatment: 14 Information Obtained From Patient Ear/Nose/Mouth/Throat Medical History: Positive  for: Chronic sinus problems/congestion; Middle ear problems Hematologic/Lymphatic Medical History: Positive for: Anemia Respiratory Medical History: Positive for: Chronic Obstructive Pulmonary Disease (COPD) Cardiovascular Medical History: Positive for: Congestive Heart Failure - EF 33% Endocrine Medical History: Positive for: Type II Diabetes Time with diabetes: 14 years Treated with: Insulin, Oral agents Blood sugar tested every day: No Blood sugar testing results: Bedtime: 176 Genitourinary Medical History: Positive for: End Stage Renal Disease Integumentary (Skin) Medical History: Positive for: History of pressure wounds Neurologic Medical History: Positive for: Neuropathy - feel and legs HBO Extended History Items Ear/Nose/Mouth/Throat: Ear/Nose/Mouth/Throat: Chronic sinus problems/congestion Middle ear problems Immunizations Pneumococcal Vaccine: Received Pneumococcal Vaccination: No Wheat, Kynlei A. ([id number]) Implantable Devices No devices added Family and Social History Current every day smoker; Marital Status - Single; Alcohol Use: Never; Drug Use: Prior History; Caffeine Use: Daily Electronic Signature(s) Signed: 06/30/2021 12:06:06 PM By: Kalman Shan DO Signed: 06/30/2021 4:14:25 PM By: Dolan Amen RN Entered By: Kalman Shan on 06/30/2021 11:55:38 [name] ([id number]) -------------------------------------------------------------------------------- SuperBill Details Patient Name: Heather Dear A. Date of Service: 06/30/2021 Medical Record Number: [id number] Patient Account Number: [id number] Date of Birth/Sex: 01-27-1976 (45 y.o. F) Treating RN: Dolan Amen Primary Care Provider: Raelene Bott Other Clinician: Referring Provider: Raelene Bott Treating Provider/Extender: Yaakov Guthrie in Treatment: 14 Diagnosis Coding ICD-10 Codes Code Description [phone] Non-pressure chronic ulcer of other part of left foot with  other specified severity S81.801A Unspecified open wound, right lower leg, initial encounter E11.621 Type 2 diabetes mellitus with foot ulcer E11.42 Type 2 diabetes mellitus with diabetic polyneuropathy Facility Procedures CPT4 Code: [id number] Description: [phone] - DEBRIDE WOUND 1ST 20 SQ CM OR < Modifier: Quantity: 1 CPT4 Code: Description: ICD-10 Diagnosis Description 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 Modifier: severity Quantity: Physician Procedures CPT4 Code: [id number] Description: [id number] - WC PHYS LEVEL 3 - EST PT Modifier: Quantity: 1 CPT4 Code: Description: ICD-10 Diagnosis Description S81.801A Unspecified open wound, right lower leg, initial encounter 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 E11.621 Type 2 diabetes mellitus with foot ulcer E11.42 Type  2 diabetes mellitus with diabetic polyneuropathy Modifier: severity Quantity: CPT4 Code: [id number] Description: [id number] - WC PHYS DEBR WO ANESTH 20 SQ CM Modifier: Quantity: 1 CPT4 Code: Description: ICD-10 Diagnosis Description 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 Modifier: severity Quantity: Electronic Signature(s) Signed: 06/30/2021 12:06:06 PM By: Kalman Shan DO Previous Signature: 06/30/2021 12:03:31 PM Version By: Dolan Amen RN Entered By: Kalman Shan on 06/30/2021 12:05:25

## 2021-06-30 NOTE — Progress Notes (Signed)
[name], [name] ([id number]) Visit Report for 06/30/2021 Arrival Information Details Patient Name: [name], [name] Date of Service: 06/30/2021 10:45 AM Medical Record Number: [id number] Patient Account Number: [id number] Date of Birth/Sex: 1976-01-18 (45 y.o. F) Treating RN: Dolan Amen Primary Care Kahlil Cowans: Raelene Bott Other Clinician: Referring Tatsuo Musial: Raelene Bott Treating Cutler Sunday/Extender: Yaakov Guthrie in Treatment: 14 Visit Information History Since Last Visit Pain Present Now: Yes Patient Arrived: Wheel Chair Arrival Time: 11:11 Accompanied By: mother Transfer Assistance: None Patient Identification Verified: Yes Secondary Verification Process Completed: Yes Patient Requires Transmission-Based No Precautions: Patient Has Alerts: Yes Patient Alerts: Patient on Blood Thinner 57m apirin Diabetic Type II Electronic Signature(s) Signed: 06/30/2021 4:14:25 PM By: SDolan AmenRN Entered By: SDolan Amenon 06/30/2021 11:12:26 SLonzo Candy([id number] -------------------------------------------------------------------------------- Clinic Level of Care Assessment Details Patient Name: Heather DearA. Date of Service: 06/30/2021 10:45 AM Medical Record Number: [id number] Account Number: [id number] of Birth/Sex: [birth year](45y.o. F) Treating RN: SDolan AmenPrimary Care Alondra Vandeven: HRaelene BottOther Clinician: Referring Beanca Kiester: HRaelene BottTreating [place] Hair/Extender: HYaakov Guthriein Treatment: 14 Clinic Level of Care Assessment Items TOOL 1 Quantity Score []  - Use when EandM and Procedure is performed on INITIAL visit 0 ASSESSMENTS - Nursing Assessment / Reassessment []  - General Physical Exam (combine w/ comprehensive assessment (listed just below) when performed on new 0 pt. evals) []  - 0 Comprehensive Assessment (HX, ROS, Risk Assessments, Wounds Hx, etc.) ASSESSMENTS - Wound and Skin Assessment /  Reassessment []  - Dermatologic / Skin Assessment (not related to wound area) 0 ASSESSMENTS - Ostomy and/or Continence Assessment and Care []  - Incontinence Assessment and Management 0 []  - 0 Ostomy Care Assessment and Management (repouching, etc.) PROCESS - Coordination of Care []  - Simple Patient / Family Education for ongoing care 0 []  - 0 Complex (extensive) Patient / Family Education for ongoing care []  - 0 Staff obtains CProgrammer, systems Records, Test Results / Process Orders []  - 0 Staff telephones HHA, Nursing Homes / Clarify orders / etc []  - 0 Routine Transfer to another Facility (non-emergent condition) []  - 0 Routine Hospital Admission (non-emergent condition) []  - 0 New Admissions / IBiomedical engineer/ Ordering NPWT, Apligraf, etc. []  - 0 Emergency Hospital Admission (emergent condition) PROCESS - Special Needs []  - Pediatric / Minor Patient Management 0 []  - 0 Isolation Patient Management []  - 0 Hearing / Language / Visual special needs []  - 0 Assessment of Community assistance (transportation, D/C planning, etc.) []  - 0 Additional assistance / Altered mentation []  - 0 Support Surface(s) Assessment (bed, cushion, seat, etc.) INTERVENTIONS - Miscellaneous []  - External ear exam 0 []  - 0 Patient Transfer (multiple staff / HCivil Service fast streamer/ Similar devices) []  - 0 Simple Staple / Suture removal (25 or less) []  - 0 Complex Staple / Suture removal (26 or more) []  - 0 Hypo/Hyperglycemic Management (do not check if billed separately) []  - 0 Ankle / Brachial Index (ABI) - do not check if billed separately Has the patient been seen at the hospital within the last three years: Yes Total Score: 0 Level Of Care: ____ SLonzo Candy([id number] Electronic Signature(s) Signed: 06/30/2021 4:14:25 PM By: SDolan AmenRN Entered By: SDolan Amenon 06/30/2021 12:03:24 Heather DearA.  ([id number] -------------------------------------------------------------------------------- Complex / Palliative Patient Assessment Details Patient Name: Heather DearA. Date of Service: 06/30/2021 10:45 AM Medical Record Number: [id number] Account Number: [id number] of Birth/Sex: 9Feb 28, 1977(46y.o. F) Treating RN:  Dolan Amen Primary Care Braxley Balandran: Raelene Bott Other Clinician: Referring Stori Royse: Raelene Bott Treating Sherril Shipman/Extender: Yaakov Guthrie in Treatment: 14 Palliative Management Criteria Complex Wound Management Criteria Patient has remarkable or complex co-morbidities requiring medications or treatments that extend wound healing times. Examples: o Diabetes mellitus with chronic renal failure or end stage renal disease requiring dialysis o Advanced or poorly controlled rheumatoid arthritis o Diabetes mellitus and end stage chronic obstructive pulmonary disease o Active cancer with current chemo- or radiation therapy Poorly controlled diabetes Care Approach Wound Care Plan: Complex Wound Management Electronic Signature(s) Signed: 06/30/2021 12:02:20 PM By: Dolan Amen RN Signed: 06/30/2021 12:06:06 PM By: Kalman Shan DO Entered By: Dolan Amen on 06/30/2021 12:02:20 [name] ([id number]) -------------------------------------------------------------------------------- Lower Extremity Assessment Details Patient Name: Heather Dear A. Date of Service: 06/30/2021 10:45 AM Medical Record Number: [id number] Patient Account Number: [id number] Date of Birth/Sex: 10-05-1976 (45 y.o. F) Treating RN: Dolan Amen Primary Care Kailoni Vahle: Raelene Bott Other Clinician: Referring Aidyn Sportsman: Raelene Bott Treating Artrell Lawless/Extender: Yaakov Guthrie in Treatment: 14 Edema Assessment Assessed: [name]: Yes] [Right: No] Edema: [Left: Ye] [Right: s] Vascular Assessment Pulses: Dorsalis Pedis Palpable: [Left:Yes] Electronic  Signature(s) Signed: 06/30/2021 4:14:25 PM By: Dolan Amen RN Entered By: Dolan Amen on 06/30/2021 11:23:35 Saner, Brittni A. ([id number]) -------------------------------------------------------------------------------- Multi Wound Chart Details Patient Name: Heather Dear A. Date of Service: 06/30/2021 10:45 AM Medical Record Number: [id number] Patient Account Number: [id number] Date of Birth/Sex: 1976/09/12 (45 y.o. 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 Comorbid History: Chronic sinus problems/congestion, Chronic sinus problems/congestion, N/A Middle ear problems, Anemia, Middle ear problems, Anemia, Chronic Obstructive Pulmonary Chronic Obstructive Pulmonary Disease (COPD), Congestive Heart Disease (COPD), Congestive Heart Failure, Type II Diabetes, End Stage Failure, Type II Diabetes, End Stage Renal Disease, History of pressure Renal Disease, History of pressure wounds, Neuropathy wounds, Neuropathy Date Acquired: 08/07/2020 06/30/2021 N/A Weeks of Treatment: 14 0 N/A Wound Status: Open Open N/A Measurements L x W x D (cm) 2.3x3x1 3.2x2.5x0.1 N/A Area (cm) : 5.419 6.283 N/A Volume (cm) : 5.419 0.628 N/A % Reduction in Area: -103.00% 0.00% N/A % Reduction in Volume: -305.90% 0.00% N/A Classification: Category/Stage II Unable to visualize wound bed N/A Exudate Amount: Medium None Present N/A Exudate Type: Serosanguineous N/A  N/A Exudate Color: red, brown N/A N/A Foul Odor After Cleansing: Yes No N/A Odor Anticipated Due to Product No N/A N/A Use: Wound Margin: Thickened N/A N/A Granulation Amount: Medium (34-66%) None Present (0%) N/A Granulation Quality: Red, Pink, Hyper-granulation N/A N/A Necrotic Amount: Medium (34-66%) Large (67-100%) N/A Necrotic Tissue: Adherent Slough Eschar N/A Exposed Structures: Fat Layer (Subcutaneous Tissue): Fat Layer (Subcutaneous Tissue): N/A Yes Yes Fascia: No Fascia: No Tendon: No Tendon: No Muscle: No Muscle: No Joint: No Joint: No Bone: No Bone: No Epithelialization: None None N/A Debridement: Debridement - Selective/Open N/A N/A Wound Pre-procedure Verification/Time 11:32 N/A N/A Out Taken: [name], [name] ([id number]) Tissue Debrided: Callus N/A N/A Level: Non-Viable Tissue N/A N/A Debridement Area (sq cm): 9 N/A N/A Instrument: Blade, Curette N/A N/A Bleeding: None N/A N/A Debridement Treatment Procedure was tolerated well  N/A N/A Response: Post Debridement 2.3x3x1 N/A N/A Measurements L x W x D (cm) Post Debridement Volume: 5.419 N/A N/A (cm) Post Debridement Stage: Category/Stage II N/A N/A Procedures Performed: Debridement N/A N/A Treatment Notes Electronic Signature(s) Signed: 06/30/2021 12:06:06 PM By: Kalman Shan DO Entered By: Kalman Shan on 06/30/2021 11:51:39 [name] ([id number]) -------------------------------------------------------------------------------- Purdy Details Patient Name: Heather Dear A. Date of Service: 06/30/2021 10:45 AM Medical Record Number: [id number] Patient Account Number: [id number] Date of Birth/Sex: [birth year] (45 y.o. F) Treating RN: Dolan Amen Primary Care Ekin Pilar: Raelene Bott Other Clinician: Referring Gicela Schwarting: Raelene Bott Treating Amala Petion/Extender: Yaakov Guthrie in Treatment: 14 Active Inactive Wound/Skin Impairment Nursing  Diagnoses: Knowledge deficit related to ulceration/compromised skin integrity Goals: Patient/caregiver will verbalize understanding of skin care regimen Date Initiated: 03/19/2021 Date Inactivated: 04/28/2021 Target Resolution Date: 04/19/2021 Goal Status: Met Ulcer/skin breakdown will have a volume reduction of 30% by week 4 Date Initiated: 03/19/2021 Date Inactivated: 04/28/2021 Target Resolution Date: 04/19/2021 Goal Status: Unmet Unmet Reason: con't tx Ulcer/skin breakdown will have a volume reduction of 50% by week 8 Date Initiated: 03/19/2021 Date Inactivated: 06/16/2021 Target Resolution Date: 05/19/2021 Goal Status: Unmet Unmet Reason: comorbidities Ulcer/skin breakdown will have a volume reduction of 80% by week 12 Date Initiated: 03/19/2021 Date Inactivated: 06/16/2021 Target Resolution Date: 06/19/2021 Goal Status: Unmet Unmet Reason: comorbidities Ulcer/skin breakdown will heal within 14 weeks Date Initiated: 03/19/2021 Target Resolution Date: 07/20/2021 Goal Status: Active Interventions: Assess patient/caregiver ability to obtain necessary supplies Assess patient/caregiver ability to perform ulcer/skin care regimen upon admission and as needed Assess ulceration(s) every visit Notes: Electronic Signature(s) Signed: 06/30/2021 4:14:25 PM By: Dolan Amen RN Entered By: Dolan Amen on 06/30/2021 11:32:08 Heather Dear A. ([id number]) -------------------------------------------------------------------------------- Pain Assessment Details Patient Name: Heather Dear A. Date of Service: 06/30/2021 10:45 AM Medical Record Number: [id number] Patient Account Number: [id number] Date of Birth/Sex: 07/30/1976 (45 y.o. F) Treating RN: Dolan Amen Primary Care Mackynzie Woolford: Raelene Bott Other Clinician: Referring Vollie Aaron: Raelene Bott Treating Kiva Norland/Extender: Yaakov Guthrie in Treatment: 14 Active Problems Location of Pain Severity and Description of Pain Patient  Has Paino Yes Site Locations Pain Location: Pain in Ulcers Rate the pain. Current Pain Level: 9 Pain Management and Medication Current Pain Management: Electronic Signature(s) Signed: 06/30/2021 4:14:25 PM By: Dolan Amen RN Entered By: Dolan Amen on 06/30/2021 11:12:19 [name] ([id number]) -------------------------------------------------------------------------------- Patient/Caregiver Education Details Patient Name: Heather Dear A. Date of Service: 06/30/2021 10:45 AM Medical Record Number: [id number] Patient Account Number: [id number] Date of Birth/Gender: Aug 23, 1976 (45 y.o. F) Treating RN: Dolan Amen Primary Care Physician: Raelene Bott Other Clinician: Referring Physician: Raelene Bott Treating Physician/Extender: Yaakov Guthrie in Treatment: 14 Education Assessment Education Provided To: Patient Education Topics Provided Wound/Skin Impairment: Methods: Explain/Verbal Responses: State content correctly Electronic Signature(s) Signed: 06/30/2021 4:14:25 PM By: Dolan Amen RN Entered By: Dolan Amen on 06/30/2021 12:03:40 [name] ([id number]) -------------------------------------------------------------------------------- Wound Assessment Details Patient Name: Heather Dear A. Date of Service: 06/30/2021 10:45 AM Medical Record Number: [id number] Patient Account Number: [id number] Date of Birth/Sex: April 11, 1976 (45 y.o. F) Treating RN: Dolan Amen Primary Care Dail Lerew: Raelene Bott Other Clinician: Referring Gabriella Guile: Raelene Bott Treating Dulcey Riederer/Extender: Yaakov Guthrie in Treatment: 14 Wound Status Wound Number: 7 Primary Pressure Ulcer Etiology: Wound Location: Left, Midline, Plantar Foot Wound Open Wounding Event: Gradually Appeared Status: Date Acquired: 08/07/2020 Comorbid Chronic sinus problems/congestion, Middle ear problems, Weeks Of Treatment: 14 History: Anemia, Chronic Obstructive  Pulmonary Disease (COPD),  Clustered Wound: No Congestive Heart Failure, Type II Diabetes, End Stage Renal Disease, History of pressure wounds, Neuropathy Photos Wound Measurements Length: (cm) 2.3 Width: (cm) 3 Depth: (cm) 1 Area: (cm) 5.419 Volume: (cm) 5.419 % Reduction in Area: -103% % Reduction in Volume: -305.9% Epithelialization: None Tunneling: No Undermining: No Wound Description Classification: Category/Stage II Wound Margin: Thickened Exudate Amount: Medium Exudate Type: Serosanguineous Exudate Color: red, brown Foul Odor After Cleansing: Yes Due to Product Use: No Slough/Fibrino Yes Wound Bed Granulation Amount: Medium (34-66%) Exposed Structure Granulation Quality: Red, Pink, Hyper-granulation Fascia Exposed: No Necrotic Amount: Medium (34-66%) Fat Layer (Subcutaneous Tissue) Exposed: Yes Necrotic Quality: Adherent Slough Tendon Exposed: No Muscle Exposed: No Joint Exposed: No Bone Exposed: No Electronic Signature(s) Signed: 06/30/2021 4:14:25 PM By: Dolan Amen RN Entered By: Dolan Amen on 06/30/2021 11:21:39 Heather Dear A. ([id number]) -------------------------------------------------------------------------------- Wound Assessment Details Patient Name: Heather Dear A. Date of Service: 06/30/2021 10:45 AM Medical Record Number: [id number] Patient Account Number: [id number] Date of Birth/Sex: 06/26/1976 (45 y.o. F) Treating RN: Dolan Amen Primary Care Inna Tisdell: Raelene Bott Other Clinician: Referring Vincent Ehrler: Raelene Bott Treating Kasee Hantz/Extender: Yaakov Guthrie in Treatment: 14 Wound Status Wound Number: 8 Primary Diabetic Wound/Ulcer of the Lower Extremity Etiology: Wound Location: Right, Anterior Lower Leg Wound Open Wounding Event: Gradually Appeared Status: Date Acquired: 06/30/2021 Comorbid Chronic sinus problems/congestion, Middle ear problems, Weeks Of Treatment: 0 History: Anemia, Chronic Obstructive  Pulmonary Disease (COPD), Clustered Wound: No Congestive Heart Failure, Type II Diabetes, End Stage Renal Disease, History of pressure wounds, Neuropathy Photos Wound Measurements Length: (cm) 3.2 Width: (cm) 2.5 Depth: (cm) 0.1 Area: (cm) 6.283 Volume: (cm) 0.628 % Reduction in Area: 0% % Reduction in Volume: 0% Epithelialization: None Tunneling: No Undermining: No Wound Description Classification: Unable to visualize wound bed Exudate Amount: None Present Foul Odor After Cleansing: No Slough/Fibrino No Wound Bed Granulation Amount: None Present (0%) Exposed Structure Necrotic Amount: Large (67-100%) Fascia Exposed: No Necrotic Quality: Eschar Fat Layer (Subcutaneous Tissue) Exposed: Yes Tendon Exposed: No Muscle Exposed: No Joint Exposed: No Bone Exposed: No Electronic Signature(s) Signed: 06/30/2021 4:14:25 PM By: Dolan Amen RN Entered By: Dolan Amen on 06/30/2021 11:22:12 [name] ([id number]) -------------------------------------------------------------------------------- Vitals Details Patient Name: Heather Dear A. Date of Service: 06/30/2021 10:45 AM Medical Record Number: [id number] Patient Account Number: [id number] Date of Birth/Sex: May 31, 1976 (45 y.o. F) Treating RN: Dolan Amen Primary Care Melisssa Donner: Raelene Bott Other Clinician: Referring Oryn Casanova: Raelene Bott Treating Lachandra Dettmann/Extender: Yaakov Guthrie in Treatment: 14 Vital Signs Time Taken: 11:11 Temperature (F): 98.6 Height (in): 69 Pulse (bpm): 70 Weight (lbs): 185 Respiratory Rate (breaths/min): 18 Body Mass Index (BMI): 27.3 Blood Pressure (mmHg): 92/69 Reference Range: 80 - 120 mg / dl Electronic Signature(s) Signed: 06/30/2021 4:14:25 PM By: Dolan Amen RN Entered By: Dolan Amen on 06/30/2021 11:11:58

## 2021-07-07 ENCOUNTER — Encounter (HOSPITAL_BASED_OUTPATIENT_CLINIC_OR_DEPARTMENT_OTHER): Payer: Medicaid Other | Admitting: Internal Medicine

## 2021-07-07 ENCOUNTER — Other Ambulatory Visit: Payer: Self-pay

## 2021-07-07 DIAGNOSIS — E11621 Type 2 diabetes mellitus with foot ulcer: Secondary | ICD-10-CM | POA: Diagnosis not present

## 2021-07-07 DIAGNOSIS — S81801D Unspecified open wound, right lower leg, subsequent encounter: Secondary | ICD-10-CM | POA: Diagnosis not present

## 2021-07-07 NOTE — Progress Notes (Signed)
SASHA, ROGEL ([id number]) Visit Report for 07/07/2021 Arrival Information Details Patient Name: [name], [name] Date of Service: 07/07/2021 10:45 AM Medical Record Number: [id number] Patient Account Number: [id number] Date of Birth/Sex: 09-04-1976 (45 y.o. F) Treating RN: Dolan Amen Primary Care Karizma Cheek: Raelene Bott Other Clinician: Referring Kharee Lesesne: Raelene Bott Treating Muhamed Luecke/Extender: Yaakov Guthrie in Treatment: 15 Visit Information History Since Last Visit Pain Present Now: Yes Patient Arrived: Wheel Chair Arrival Time: 11:20 Accompanied By: mother Transfer Assistance: Manual Patient Identification Verified: Yes Secondary Verification Process Completed: Yes Patient Requires Transmission-Based No Precautions: Patient Has Alerts: Yes Patient Alerts: Patient on Blood Thinner 66m apirin Diabetic Type II Electronic Signature(s) Signed: 07/07/2021 4:36:53 PM By: SDolan AmenRN Entered By: SDolan Amenon 07/07/2021 11:21:25 SLonzo Candy([id number] -------------------------------------------------------------------------------- Clinic Level of Care Assessment Details Patient Name: Heather DearA. Date of Service: 07/07/2021 10:45 AM Medical Record Number: [id number] Account Number: [id number] of Birth/Sex: [birth year](45y.o. F) Treating RN: SDolan AmenPrimary Care Raheim Beutler: HRaelene BottOther Clinician: Referring Jaiyah Beining: HRaelene BottTreating Latanja Lehenbauer/Extender: HYaakov Guthriein Treatment: 15 Clinic Level of Care Assessment Items TOOL 1 Quantity Score []  - Use when EandM and Procedure is performed on INITIAL visit 0 ASSESSMENTS - Nursing Assessment / Reassessment []  - General Physical Exam (combine w/ comprehensive assessment (listed just below) when performed on new 0 pt. evals) []  - 0 Comprehensive Assessment (HX, ROS, Risk Assessments, Wounds Hx, etc.) ASSESSMENTS - Wound and Skin Assessment /  Reassessment []  - Dermatologic / Skin Assessment (not related to wound area) 0 ASSESSMENTS - Ostomy and/or Continence Assessment and Care []  - Incontinence Assessment and Management 0 []  - 0 Ostomy Care Assessment and Management (repouching, etc.) PROCESS - Coordination of Care []  - Simple Patient / Family Education for ongoing care 0 []  - 0 Complex (extensive) Patient / Family Education for ongoing care []  - 0 Staff obtains CProgrammer, systems Records, Test Results / Process Orders []  - 0 Staff telephones HHA, Nursing Homes / Clarify orders / etc []  - 0 Routine Transfer to another Facility (non-emergent condition) []  - 0 Routine Hospital Admission (non-emergent condition) []  - 0 New Admissions / IBiomedical engineer/ Ordering NPWT, Apligraf, etc. []  - 0 Emergency Hospital Admission (emergent condition) PROCESS - Special Needs []  - Pediatric / Minor Patient Management 0 []  - 0 Isolation Patient Management []  - 0 Hearing / Language / Visual special needs []  - 0 Assessment of Community assistance (transportation, D/C planning, etc.) []  - 0 Additional assistance / Altered mentation []  - 0 Support Surface(s) Assessment (bed, cushion, seat, etc.) INTERVENTIONS - Miscellaneous []  - External ear exam 0 []  - 0 Patient Transfer (multiple staff / HCivil Service fast streamer/ Similar devices) []  - 0 Simple Staple / Suture removal (25 or less) []  - 0 Complex Staple / Suture removal (26 or more) []  - 0 Hypo/Hyperglycemic Management (do not check if billed separately) []  - 0 Ankle / Brachial Index (ABI) - do not check if billed separately Has the patient been seen at the hospital within the last three years: Yes Total Score: 0 Level Of Care: ____ SLonzo Candy([id number] Electronic Signature(s) Signed: 07/07/2021 4:36:53 PM By: SDolan AmenRN Entered By: SDolan Amenon 07/07/2021 12:04:06 SLonzo Candy ([id number] -------------------------------------------------------------------------------- Encounter Discharge Information Details Patient Name: Heather DearA. Date of Service: 07/07/2021 10:45 AM Medical Record Number: [id number] Account Number: [id number] of Birth/Sex: [birth year](45y.o. F) Treating RN: SDolan Amen  Primary Care Eliana Lueth: Raelene Bott Other Clinician: Referring Saladin Petrelli: Raelene Bott Treating Yesenia Fontenette/Extender: Yaakov Guthrie in Treatment: 15 Encounter Discharge Information Items Post Procedure Vitals Discharge Condition: Stable Temperature (F): 99.4 Ambulatory Status: Wheelchair Pulse (bpm): 106 Discharge Destination: Home Respiratory Rate (breaths/min): 18 Transportation: Private Auto Blood Pressure (mmHg): 106/75 Accompanied By: mother Schedule Follow-up Appointment: Yes Clinical Summary of Care: Electronic Signature(s) Signed: 07/07/2021 4:32:04 PM By: Dolan Amen RN Entered By: Dolan Amen on 07/07/2021 16:32:04 [name] ([id number]) -------------------------------------------------------------------------------- Lower Extremity Assessment Details Patient Name: Heather Dear A. Date of Service: 07/07/2021 10:45 AM Medical Record Number: [id number] Patient Account Number: [id number] Date of Birth/Sex: 01/01/1976 (45 y.o. F) Treating RN: Dolan Amen Primary Care Gwenn Teodoro: Raelene Bott Other Clinician: Referring Mak Bonny: Raelene Bott Treating Malarie Tappen/Extender: Yaakov Guthrie in Treatment: 15 Edema Assessment Assessed: [Left: Yes] [Right: No] Edema: [Left: N] [Right: o] Vascular Assessment Pulses: Dorsalis Pedis Palpable: [Left:Yes] Electronic Signature(s) Signed: 07/07/2021 4:36:53 PM By: Dolan Amen RN Entered By: Dolan Amen on 07/07/2021 11:33:14 Keeran, Zaelyn A. ([id number]) -------------------------------------------------------------------------------- Multi Wound Chart  Details Patient Name: Heather Dear A. Date of Service: 07/07/2021 10:45 AM Medical Record Number: [id number] Patient Account Number: [id number] Date of Birth/Sex: Oct 02, 1976 (45 y.o. 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 Comorbid History: Chronic sinus problems/congestion, Chronic sinus problems/congestion, N/A Middle ear problems, Anemia, Middle ear problems, Anemia, Chronic Obstructive Pulmonary Chronic Obstructive Pulmonary Disease (COPD), Congestive Heart Disease (COPD), Congestive Heart Failure, Type II Diabetes, End Stage Failure, Type II Diabetes, End Stage Renal Disease, History of pressure Renal Disease, History of pressure wounds, Neuropathy wounds, Neuropathy Date Acquired: 08/07/2020 06/30/2021 N/A Weeks of Treatment: 15 1 N/A Wound Status: Open Open N/A Measurements L x W x D (cm) 2.5x3.5x1.4 4.2x2.8x0.1 N/A Area (cm) : 6.872 9.236 N/A Volume (cm) : 9.621 0.924 N/A % Reduction in Area: -157.40% -47.00% N/A % Reduction in Volume: -620.70% -47.10% N/A Classification: Category/Stage II Unable to visualize wound bed N/A Exudate Amount: Medium Medium N/A Exudate Type: Serosanguineous Serosanguineous N/A Exudate Color: red, brown red, brown N/A Wound Margin: Thickened N/A N/A Granulation Amount: Medium (34-66%) Small (1-33%) N/A Granulation Quality: Red, Pink, Hyper-granulation Pink N/A Necrotic Amount: Medium (34-66%)  Large (67-100%) N/A Necrotic Tissue: Adherent Goldsmith N/A Exposed Structures: Fat Layer (Subcutaneous Tissue): Fat Layer (Subcutaneous Tissue): N/A Yes Yes Fascia: No Fascia: No Tendon: No Tendon: No Muscle: No Muscle: No Joint: No Joint: No Bone: No Bone: No Epithelialization: None None N/A Debridement: Debridement - Excisional Debridement - Excisional N/A Pre-procedure Verification/Time 11:42 11:42 N/A Out Taken: Tissue Debrided: Callus, Subcutaneous, Slough Necrotic/Eschar, Subcutaneous, N/A Slough Level: Skin/Subcutaneous Tissue Skin/Subcutaneous Tissue N/A Debridement Area (sq cm): 8.75 11.76 N/A Ergle, Leahanna A. ([id number]) Instrument: Blade, Curette, Forceps, Scissors Blade, Curette, Forceps, Scissors N/A Bleeding: Minimum Moderate N/A Hemostasis Achieved: Pressure Silver Nitrate N/A Debridement Treatment Procedure was tolerated well Procedure was tolerated well N/A Response: Post Debridement 2.5x3.5x1.4 4.2x2.8x0.2 N/A Measurements L x W x D (cm) Post Debridement Volume: 9.621 1.847 N/A (cm) Post Debridement Stage: Category/Stage II N/A N/A Procedures Performed: Debridement Debridement N/A Treatment  Notes Electronic Signature(s) Signed: 07/07/2021 12:11:09 PM By: Kalman Shan DO Entered By: Kalman Shan on 07/07/2021 12:02:09 [name] ([id number]) -------------------------------------------------------------------------------- [place] Details Patient Name: Heather Dear A. Date of Service: 07/07/2021 10:45 AM Medical Record Number: [id number] Patient Account Number: [id number] Date of Birth/Sex: Jun 21, 1976 (45 y.o. F) Treating RN: Dolan Amen Primary Care Markia Kyer: Raelene Bott Other Clinician: Referring Santia Labate: Raelene Bott Treating Daniyla Pfahler/Extender: Yaakov Guthrie in Treatment: 15 Active Inactive Wound/Skin Impairment Nursing Diagnoses: Knowledge deficit related to  ulceration/compromised skin integrity Goals: Patient/caregiver will verbalize understanding of skin care regimen Date Initiated: 03/19/2021 Date Inactivated: 04/28/2021 Target Resolution Date: 04/19/2021 Goal Status: Met Ulcer/skin breakdown will have a volume reduction of 30% by week 4 Date Initiated: 03/19/2021 Date Inactivated: 04/28/2021 Target Resolution Date: 04/19/2021 Goal Status: Unmet Unmet Reason: con't tx Ulcer/skin breakdown will have a volume reduction of 50% by week 8 Date Initiated: 03/19/2021 Date Inactivated: 06/16/2021 Target Resolution Date: 05/19/2021 Goal Status: Unmet Unmet Reason: comorbidities Ulcer/skin breakdown will have a volume reduction of 80% by week 12 Date Initiated: 03/19/2021 Date Inactivated: 06/16/2021 Target Resolution Date: 06/19/2021 Goal Status: Unmet Unmet Reason: comorbidities Ulcer/skin breakdown will heal within 14 weeks Date Initiated: 03/19/2021 Target Resolution Date: 07/20/2021 Goal Status: Active Interventions: Assess patient/caregiver ability to obtain necessary supplies Assess patient/caregiver ability to perform ulcer/skin care regimen upon admission and as needed Assess ulceration(s) every visit Notes: Electronic Signature(s) Signed: 07/07/2021 4:36:53 PM By: Dolan Amen RN Entered By: Dolan Amen on 07/07/2021 11:42:07 Heather Dear A. ([id number]) -------------------------------------------------------------------------------- Pain Assessment Details Patient Name: Heather Dear A. Date of Service: 07/07/2021 10:45 AM Medical Record Number: [id number] Patient Account Number: [id number] Date of Birth/Sex: [birth year] (45 y.o. F) Treating RN: Dolan Amen Primary Care Emmery Seiler: Raelene Bott Other Clinician: Referring Deshane Cotroneo: Raelene Bott Treating Millan Legan/Extender: Yaakov Guthrie in Treatment: 15 Active Problems Location of Pain Severity and Description of Pain Patient Has Paino Yes Site Locations Rate the  pain. Current Pain Level: 8 Pain Management and Medication Current Pain Management: Electronic Signature(s) Signed: 07/07/2021 4:36:53 PM By: Dolan Amen RN Entered By: Dolan Amen on 07/07/2021 11:25:04 [name] ([id number]) -------------------------------------------------------------------------------- Patient/Caregiver Education Details Patient Name: Heather Dear A. Date of Service: 07/07/2021 10:45 AM Medical Record Number: [id number] Patient Account Number: [id number] Date of Birth/Gender: May 31, 1976 (45 y.o. F) Treating RN: Dolan Amen Primary Care Physician: Raelene Bott Other Clinician: Referring Physician: Raelene Bott Treating Physician/Extender: Yaakov Guthrie in Treatment: 15 Education Assessment Education Provided To: Patient Education Topics Provided Wound/Skin Impairment: Methods: Explain/Verbal Responses: State content correctly Electronic Signature(s) Signed: 07/07/2021 4:36:53 PM By: Dolan Amen RN Entered By: Dolan Amen on 07/07/2021 12:04:18 [name] ([id number]) -------------------------------------------------------------------------------- Wound Assessment Details Patient Name: Heather Dear A. Date of Service: 07/07/2021 10:45 AM Medical Record Number: [id number] Patient Account Number: [id number] Date of Birth/Sex: [birth year] (45 y.o. F) Treating RN: Dolan Amen Primary Care Branden Vine: Raelene Bott Other Clinician: Referring Adisyn Ruscitti: Raelene Bott Treating Jahrell Hamor/Extender: Yaakov Guthrie in Treatment: 15 Wound Status Wound Number: 7 Primary Pressure Ulcer Etiology: Wound Location: Left, Midline, Plantar Foot Wound Open Wounding Event: Gradually Appeared Status: Date Acquired: 08/07/2020 Comorbid Chronic sinus problems/congestion, Middle ear problems, Weeks Of Treatment: 15 History: Anemia, Chronic Obstructive Pulmonary Disease (COPD), Clustered Wound: No Congestive Heart Failure,  Type II Diabetes, End Stage Renal Disease, History of pressure wounds, Neuropathy Photos Wound Measurements Length: (cm) 2.5 Width: (cm) 3.5 Depth: (cm) 1.4 Area: (cm) 6.872 Volume: (cm) 9.621 % Reduction in Area: -  157.4% % Reduction in Volume: -620.7% Epithelialization: None Tunneling: No Undermining: No Wound Description Classification: Category/Stage II Wound Margin: Thickened Exudate Amount: Medium Exudate Type: Serosanguineous Exudate Color: red, brown Foul Odor After Cleansing: No Slough/Fibrino Yes Wound Bed Granulation Amount: Medium (34-66%) Exposed Structure Granulation Quality: Red, Pink, Hyper-granulation Fascia Exposed: No Necrotic Amount: Medium (34-66%) Fat Layer (Subcutaneous Tissue) Exposed: Yes Necrotic Quality: Adherent Slough Tendon Exposed: No Muscle Exposed: No Joint Exposed: No Bone Exposed: No Treatment Notes Wound #7 (Foot) Wound Laterality: Plantar, Left, Midline Cleanser Soap and Water Discharge Instruction: Gently cleanse wound with antibacterial soap, rinse and pat dry prior to dressing wounds Kobel, Lella A. ([id number]) Peri-Wound Care Desitin Maximum Strength Ointment 4 (oz) Discharge Instruction: Peri wound Topical Gentamicin Discharge Instruction: Apply to wound bed and tunnel area under the dressing Primary Dressing Silvercel Small 2x2 (in/in) Discharge Instruction: Apply to wound bed Secondary Dressing ABD Pad 5x9 (in/in) Discharge Instruction: Cover with ABD pad on top of dressing Gauze Discharge Instruction: To hold dressing in place Secured With Kerlix Roll Sterile or Non-Sterile 6-ply 4.5x4 (yd/yd) Discharge Instruction: Apply Kerlix as directed Compression Wrap Compression Stockings Add-Ons Electronic Signature(s) Signed: 07/07/2021 4:36:53 PM By: Dolan Amen RN Entered By: Dolan Amen on 07/07/2021 11:31:37 Boster, Britny A.  ([id number]) -------------------------------------------------------------------------------- Wound Assessment Details Patient Name: Heather Dear A. Date of Service: 07/07/2021 10:45 AM Medical Record Number: [id number] Patient Account Number: [id number] Date of Birth/Sex: [birth year] (45 y.o. F) Treating RN: Dolan Amen Primary Care Linzy Darling: Raelene Bott Other Clinician: Referring Nakeia Calvi: Raelene Bott Treating Neeraj Housand/Extender: Yaakov Guthrie in Treatment: 15 Wound Status Wound Number: 8 Primary Diabetic Wound/Ulcer of the Lower Extremity Etiology: Wound Location: Right, Anterior Lower Leg Wound Open Wounding Event: Gradually Appeared Status: Date Acquired: 06/30/2021 Comorbid Chronic sinus problems/congestion, Middle ear problems, Weeks Of Treatment: 1 History: Anemia, Chronic Obstructive Pulmonary Disease (COPD), Clustered Wound: No Congestive Heart Failure, Type II Diabetes, End Stage Renal Disease, History of pressure wounds, Neuropathy Photos Wound Measurements Length: (cm) 4.2 Width: (cm) 2.8 Depth: (cm) 0.1 Area: (cm) 9.236 Volume: (cm) 0.924 % Reduction in Area: -47% % Reduction in Volume: -47.1% Epithelialization: None Tunneling: No Undermining: No Wound Description Classification: Unable to visualize wound bed Exudate Amount: Medium Exudate Type: Serosanguineous Exudate Color: red, brown Foul Odor After Cleansing: No Slough/Fibrino No Wound Bed Granulation Amount: Small (1-33%) Exposed Structure Granulation Quality: Pink Fascia Exposed: No Necrotic Amount: Large (67-100%) Fat Layer (Subcutaneous Tissue) Exposed: Yes Necrotic Quality: Eschar, Adherent Slough Tendon Exposed: No Muscle Exposed: No Joint Exposed: No Bone Exposed: No Treatment Notes Wound #8 (Lower Leg) Wound Laterality: Right, Anterior Cleanser Normal Saline Discharge Instruction: Wash your hands with soap and water. Remove old dressing, discard into plastic bag and  place into trash. Cleanse the wound with Normal Saline prior to applying a clean dressing using gauze sponges, not tissues or cotton balls. Do not Jankowski, Lennyx A. ([id number]) scrub or use excessive force. Pat dry using gauze sponges, not tissue or cotton balls. Peri-Wound Care Topical Santyl Collagenase Ointment, 30 (gm), tube Discharge Instruction: apply nickel thick to wound bed only Primary Dressing Secondary [place], 4x4 (in/in) Discharge Instruction: Apply over dressing to secure in place. Secured With Compression Wrap Compression Stockings Add-Ons Electronic Signature(s) Signed: 07/07/2021 4:36:53 PM By: Dolan Amen RN Entered By: Dolan Amen on 07/07/2021 11:32:11 [name] ([id number]) -------------------------------------------------------------------------------- Vitals Details Patient Name: Heather Dear A. Date of Service: 07/07/2021 10:45 AM Medical Record Number: [id number] Patient Account  Number: [id number] Date of Birth/Sex: 1975/11/28 (45 y.o. F) Treating RN: Dolan Amen Primary Care Giannis Corpuz: Raelene Bott Other Clinician: Referring Arek Spadafore: Raelene Bott Treating Demiya Magno/Extender: Yaakov Guthrie in Treatment: 15 Vital Signs Time Taken: 11:21 Temperature (F): 99.4 Height (in): 69 Pulse (bpm): 106 Weight (lbs): 185 Respiratory Rate (breaths/min): 18 Body Mass Index (BMI): 27.3 Blood Pressure (mmHg): 106/75 Reference Range: 80 - 120 mg / dl Electronic Signature(s) Signed: 07/07/2021 4:36:53 PM By: Dolan Amen RN Entered By: Dolan Amen on 07/07/2021 11:24:49

## 2021-07-07 NOTE — Progress Notes (Signed)
[name] ([id number]) Visit Report for 07/07/2021 Chief Complaint Document Details Patient Name: [name] A. Date of Service: 07/07/2021 10:45 AM Medical Record Number: [id number] Patient Account Number: [id number] Date of Birth/Sex: 10-16-1976 (45 y.o. F) Treating RN: Dolan Amen Primary Care Provider: Raelene Bott Other Clinician: Referring Provider: Raelene Bott Treating Provider/Extender: Yaakov Guthrie in Treatment: 15 Information Obtained from: Patient Chief Complaint Left great toe ulcer 03/19/2021; patient referred by Dr. Luana Shu who has been looking after her left foot for quite a period of time for review of a nonhealing area in the left midfoot Electronic Signature(s) Signed: 07/07/2021 12:11:09 PM By: Kalman Shan DO Entered By: Kalman Shan on 07/07/2021 12:02:25 [name] ([id number]) -------------------------------------------------------------------------------- Debridement Details Patient Name: Heather Dear A. Date of Service: 07/07/2021 10:45 AM Medical Record Number: [id number] Patient Account Number: [id number] Date of Birth/Sex: 08/24/1976 (45 y.o. F) Treating RN: Dolan Amen Primary Care Provider: Raelene Bott Other Clinician: Referring Provider: Raelene Bott Treating Provider/Extender: Yaakov Guthrie in Treatment: 15 Debridement Performed for Wound #7 Left,Midline,Plantar Foot Assessment: Performed By: Physician Kalman Shan, MD Debridement Type: Debridement Level of Consciousness (Pre- Awake and Alert procedure): Pre-procedure Verification/Time Out Yes - 11:42 Taken: Start Time: 11:42 Total Area Debrided (L x W): 2.5 (cm) x 3.5 (cm) = 8.75 (cm) Tissue and other material Viable, Non-Viable, Callus, Slough, Subcutaneous, Slough debrided: Level: Skin/Subcutaneous Tissue Debridement Description: Excisional Instrument: Blade, Curette, Forceps, Scissors Bleeding: Minimum Hemostasis Achieved:  Pressure Response to Treatment: Procedure was tolerated well Level of Consciousness (Post- Awake and Alert procedure): Post Debridement Measurements of Total Wound Length: (cm) 2.5 Stage: Category/Stage II Width: (cm) 3.5 Depth: (cm) 1.4 Volume: (cm) 9.621 Character of Wound/Ulcer Post Debridement: Stable Post Procedure Diagnosis Same as Pre-procedure Electronic Signature(s) Signed: 07/07/2021 12:11:09 PM By: Kalman Shan DO Signed: 07/07/2021 4:36:53 PM By: Dolan Amen RN Entered By: Dolan Amen on 07/07/2021 11:43:41 [name], [name] ([id number]) -------------------------------------------------------------------------------- Debridement Details Patient Name: Heather Dear A. Date of Service: 07/07/2021 10:45 AM Medical Record Number: [id number] Patient Account Number: [id number] Date of Birth/Sex: May 17, 1976 (45 y.o. F) Treating RN: Dolan Amen Primary Care Provider: Raelene Bott Other Clinician: Referring Provider: Raelene Bott Treating Provider/Extender: Yaakov Guthrie in Treatment: 15 Debridement Performed for Wound #8 Right,Anterior Lower Leg Assessment: Performed By: Physician Kalman Shan, MD Debridement Type: Debridement Severity of Tissue Pre Debridement: Fat layer exposed Level of Consciousness (Pre- Awake and Alert procedure): Pre-procedure Verification/Time Out Yes - 11:42 Taken: Start Time: 11:42 Total Area Debrided (L x W): 4.2 (cm) x 2.8 (cm) = 11.76 (cm) Tissue and other material Viable, Non-Viable, Eschar, Slough, Subcutaneous, Slough debrided: Level: Skin/Subcutaneous Tissue Debridement Description: Excisional Instrument: Blade, Curette, Forceps, Scissors Bleeding: Moderate Hemostasis Achieved: Silver Nitrate Response to Treatment: Procedure was tolerated well Level of Consciousness (Post- Awake and Alert procedure): Post Debridement Measurements of Total Wound Length: (cm) 4.2 Width: (cm) 2.8 Depth: (cm)  0.2 Volume: (cm) 1.847 Character of Wound/Ulcer Post Debridement: Stable Severity of Tissue Post Debridement: Fat layer exposed Post Procedure Diagnosis Same as Pre-procedure Notes 1 silver nitrate stick used Electronic Signature(s) Signed: 07/07/2021 12:11:09 PM By: Kalman Shan DO Signed: 07/07/2021 4:36:53 PM By: Dolan Amen RN Entered By: Dolan Amen on 07/07/2021 11:56:15 [name], [name] ([id number]) -------------------------------------------------------------------------------- HPI Details Patient Name: Heather Dear A. Date of Service: 07/07/2021 10:45 AM Medical Record Number: [id number] Patient Account Number: [id number] Date of Birth/Sex: 1975/11/13 (45 y.o. F) Treating RN: Dolan Amen Primary Care Provider: Raelene Bott Other Clinician: Referring  Provider: Raelene Bott Treating Provider/Extender: Yaakov Guthrie in Treatment: 15 History of Present Illness HPI Description: 01/18/18-She is here for initial evaluation of the left great toe ulcer. She is a poor historian in regards to timeframe in detail. She states approximately 4 weeks ago she lacerated her toe on something in the house. She followed up with her primary care who placed her on Bactrim and ultimately a second dose of Bactrim prior to coming to wound clinic. She states she has been treating the toe with peroxide, Betadine and a Band-Aid. She did not check her blood sugar this morning but checked it yesterday morning it was 327; she is unaware of a recent A1c and there are no current records. She saw Dr. she would've orthopedics last week for an old injury to the left ankle, she states he did not see her toe, nor did she bring it to his attention. She smokes approximately 1 pack cigarettes a day. Her social situation is concerning, she arrives this morning with her mother who appears extremely intoxicated/under the influence; her mother was asked to leave the room and be monitored by the  patient's grandmother. The patient's aunt then accompanied the patient and the room throughout the rest of the appointment. We had a lengthy discussion regarding the deleterious effects of uncontrolled hyperglycemia and smoking as it relates to wound healing and overall health. She was strongly encouraged to decrease her smoking and get her diabetes under better control. She states she is currently on a diet and has cut down her [facility] consumption. The left toe is erythematous, macerated and slightly edematous with malodor present. The edema in her left foot is below her baseline, there is no erythema streaking. We will treat her with Santyl, doxycycline; we have ordered and xray, culture and provided a Peg assist surgical shoe and cultured the wound. 01/25/18-She is here in follow-up evaluation for a left great toe ulcer and presents with an abscess to her suprapubic area. She states her blood sugars remain elevated, feeling "sick" and if levels are below 250, but she is trying. She has made no attempt to decrease her smoking stating that we "can't take away her food in her cigarettes". She has been compliant with offloading using the PEG assist you. She is using Santyl daily. the culture obtained last week grew staph aureus and Enterococcus faecalis; continues on the doxycycline and Augmentin was added on Monday. The suprapubic area has erythema, no femoral variation, purple discoloration, minimal induration, was accessed with a cotton tip applicator with sanguinopurulent drainage, this was cultured, I suspect the current antibiotic treatment will cover and we will not add anything to her current treatment plan. She was advised to go to urgent care or ER with any change in redness, induration or fever. 02/01/18-She is here in follow-up evaluation for left great toe ulcers and a new abdominal abscess from last week. She was able to use packing until earlier this week, where she "forgot it was  there". She states she was feeling ill with GI symptoms last week and was not taking her antibiotic. She states her glucose levels have been predominantly less than 200, with occasional levels between 200-250. She thinks this was contributing to her GI symptoms as they have resolved without intervention. There continues to be significant laceration to left toe, otherwise it clinically looks stable/improved. There is now less superficial opening to the lateral aspect of the great toe that was residual blister. We will transition to [facility]  to all wounds, she will continue her Augmentin. If there is no change or deterioration next week for reculture. 02/08/18-She is here in follow-up evaluation for left great toe ulcer and abdominal ulcer. There is an improvement in both wounds. She has been wrapping her left toe with coban, not by our direction, which has created an area of discoloration to the medial aspect; she has been advised to NOT use coban secondary to her neuropathy. She states her glucose levels have been high over this last week ranging from 200-350, she continues to smoke. She admits to being less compliant with her offloading shoe. We will continue with same treatment plan and she will follow-up next week. 02/15/18-She is here in follow-up evaluation for left great toe ulcer and abdominal ulcer. The abdominal ulcer is epithelialized. The left great toe ulcer is improved and all injury from last week using the Coban wrap is resolved, the lateral ulcer is healed. She admits to noncompliance with wearing offloading shoe and admits to glucose levels being greater than 300 most of the week. She continues to smoke and expresses no desire to quit. There is one area medially that probes deeper than it has historically, erythema to the toe and dorsal foot has consistently waxed and waned. There is no overt signs of cellulitis or infection but we will culture the wound for any occult infection  given the new area of depth and erythema. We will hold off on sensitivities for initiation of antibiotic therapy. 02/22/18-She is here in follow up evaluation for left great toe ulcer. There is overall significant improvement in both wound appearance, erythema and edema with changes made last week. She was not initiated on antibiotic therapy. Culture obtained last week showed oxacillin sensitive staph aureus, sensitive to clindamycin. Clindamycin has been called into the pharmacy but she has been instructed to hold off on initiation secondary to overall clinical improvement and her history of antibiotic intolerance. She has been instructed to contact the clinic with any noted changes/deterioration and the wound, erythema, edema and/or pain. She will follow-up next week. She continues to smoke and her glucose levels remain elevated >250; she admits to compliance with offloading shoe 03/01/18 on evaluation today patient appears to be doing fairly well in regard to her left first toe ulcer. She has been tolerating the dressing changes with the [facility] Dressing without complication and overall this has definitely showed signs of improvement according to records as well is what the patient tells me today. I'm very pleased in that regard. She is having no pain today 03/08/18 She is here for follow up evaluation of a left great toe ulcer. She remains non-compliant with glucose control and smoking cessation; glucose levels consistently >200. She states that she got new shoe inserts/peg assist. She admits to compliance with offloading. Since my last evaluation there is significant improvement. We will switch to prisma at this time and she will follow up next week. She is noted to be tachycardic at this appointment, heart rate 120s; she has a history of heart rate 70-130 according to our records. She admits to extreme agitation r/t personal issues; she was advised to monitor her heartrate and contact her  physician if it does not return to a more normal range (<100). She takes cardizem twice daily. 03/15/18-She is here in follow-up evaluation for left great toe ulcer. She remains noncompliant with glucose control and smoking cessation. She admits to compliance with wearing offloading shoe. The ulcer is improved/stable and we will continue  with the same treatment plan and she will follow-up next week 03/22/18-She is here for evaluation for left great toe ulcer. There continues to be significant improvement despite recurrent hyperglycemia (over 500 yesterday) and she continues to smoke. She has been compliant with offloading and we will continue with same treatment plan and she will follow-up next week. 03/29/18-She is here for evaluation for left great toe ulcer. Despite continuing to smoke and uncontrolled diabetes she continues to improve. She is compliant with offloading shoe. We will continue with the same treatment plan and she will follow-up next week 04/05/18- She is here in follow up evaluation for a left great toe ulcer; she presents with small pustule to left fifth toe (resembles ant bite). She admits to compliance with wearing offloading shoe; continues to smoke or have uncontrolled blood glucose control. There is more callus than usual with evidence of bleeding; she denies known trauma. 04/12/18-She is here for evaluation of left great toe ulcer. Despite noncompliance with glycemic control and smoking she continues to make Yingling, Lindamarie A. ([id number]) improvement. She continues to wear offloading shoe. The pustule, that was identified last week, to the left fifth toe is resolved. She will follow-up in 2 weeks 05/03/18-she is seen in follow-up evaluation for a left great toe ulcer. She is compliant with offloading, otherwise noncompliant with glycemic control and smoking. She has plateaued and there is minimal improvement noted. We will transition to [facility], replaced the insert to  her surgical shoe and she will follow-up in one week 05/10/18- She is here in follow up evaluation for a left great toe ulcer. It appears stable despite measurement change. We will continue with same treatment plan and follow up next week. 05/24/18-She is seen in follow-up evaluation for a left great toe ulcer. She remains compliant with offloading, has made significant improvement in her diet, decreasing the amount of sugar/soda. She said her recent A1c was 10.9 which is lower than. She did see a diabetic nutritionist/educator yesterday. She continues to smoke. We will continue with the same treatment plan and she'll follow-up next week. 05/31/18- She is seen in follow-up evaluation for left great toe ulcer. She continues to remain compliant with offloading, continues to make improvement in her diet, increasing her water and decreasing the amount of sugar/soda. She does continue to smoke with no desire to quit. We will apply Prisma to the depth and Hydrofera Blue over. We have not received insurance authorization for oasis. She will follow up next week. 06/07/18-She is seen in follow-up evaluation for left great toe ulcer. It has stalled according to today's measurements although base appears stable. She says she saw a diabetic educator yesterday; her average blood sugars are less than 300 which is an improvement for her. She continues to smoke and states "that's my next step" She continues with water over soda. We will order for xray, culture and reinstate ace wrap compression prior to placing apligraf for next week. She is voicing no complaints or concerns. Her dressing will change to iodoflex over the next week in preparation for apligraf. 06/14/18-She is seen in follow-up evaluation for left great toe ulcer. Plain film x-ray performed last week was negative for osteomyelitis. Wound culture obtained last week grew strep B and OSSA; she is initiated on keflex and cefdinir today; there is erythema to the  toe which could be from ace wrap compression, she has a history of wrapping too tight and has has been encouraged to maintain ace wraps that we place  today. We will hold off on application of apligraf today, will apply next week after antibiotic therapy has been initiated. She admits today that she has resumed taking a shower with her foot/toe submerged in water, she has been reminded to keep foot/toe out of the bath water. She will be seen in follow up next week 06/21/18-she is seen in follow-up evaluation for left great toe ulcer. She is tolerating antibiotic therapy with no GI disturbance. The wound is stable. Apligraf was applied today. She has been decreasing her smoking, only had 4 cigarettes yesterday and 1 today. She continues being more compliant in diabetic diet. She will follow-up next week for evaluation of site, if stable will remove at 2 weeks. 06/28/18- She is here in follow up evalution. Apligraf was placed last week, she states the dressing fell off on Tuesday and she was dressing with hydrofera blue. She is healed and will be discharged from the clinic today. She has been instructed to continue with smoking cessation, continue monitoring glucose levels, offloading for an additional 4 weeks and continue with hydrofera blue for additional two weeks for any possible microscopic opening. Readmission: 08/07/18 on evaluation today patient presents for reevaluation concerning the ulcer of her right great toe. She was previously discharged on 06/28/18 healed. Nonetheless she states that this began to show signs of drainage she subsequently went to her primary care provider. Subsequently an x-ray was performed on 08/01/18 which was negative. The patient was also placed on antibiotics at that time. Fortunately they should have been effective for the infection. Nonetheless she's been experiencing some improvement but still has a lot of drainage coming from the wound itself. 08/14/18 on evaluation  today patient's wound actually does show signs of improvement in regard to the erythema at this point. She has completed the antibiotics. With that being said we did discuss the possibility of placing her in a total contact cast as of today although I think that I may want to give this just a little bit more time to ensure nothing recurrence as far as her infection is concerned. I do not want to put in the cast and risk infection at that time if things are not completely resolved. With that being said she is gonna require some debridement today. 08/21/18 on evaluation today patient actually appears to be doing okay in regard to her toe ulcer. She's been tolerating the dressing changes without complication. With that being said it does appear that she is ready and in fact I think it's appropriate for [place] to go ahead and initiate the total contact cast today. Nonetheless she will require some sharp debridement to prepare the wound for application. Overall I feel like things have been progressing well but we do need to do something to get this to close more readily. 08/24/18 patient seen today for reevaluation after having had the total contact cast applied on Tuesday. She seems to have done very well the wound appears to be doing great and overall I'm pleased with the progress that she's made. There were no abnormal areas of rubbing from the cast on her lower extremity. 08/30/18 on evaluation today patient actually appears to be completely healed in regard to her plantar toe ulcer. She tells me at this point she's been having a lot of issues with the cast. She almost fell a couple of times the state shall the step of her dog a couple times as well. This is been a very frustrating process for her other nonetheless  she has completely healed the wound which is excellent news. Overall there does not appear to be the evidence of infection at this time which is great news. 09/11/18 evaluation today patient  presents for follow-up concerning her great toe ulcer on the left which has unfortunately reopened since I last saw her which was only a couple of weeks ago. Unfortunately she was not able to get in to get the shoe and potentially the AFO that's gonna be necessary due to her left foot drop. She continues with offloading shoe but this is not enough to prevent her from reopening it appears. When we last had her in the total contact cast she did well from a healing standpoint but unfortunately the wound reopened as soon as she came out of the cast within just a couple of weeks. 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 This is also part of the reason why she falls on a regular basis. I just do not believe that is gonna be safe for [place] to reinitiate the total contact cast as last time we had this on she fell 3 times one week which is definitely not normal for her. 09/18/18 upon evaluation today the patient actually appears to be doing about the same in regard to her toe ulcer. She did not contact [employer] as I asked her to even though I had given her the prescription. In fact she actually states that she has no idea where the prescription is. She did apparently call [employer] and they told her that all she needed to do was bring the prescription in order to be able to be seen and work on getting the AFO for her left foot. With all that being said she still does not have an appointment and I'm not sure were things stand that regard. I will give her a new prescription today in order to contact them to get this set up. 09/25/18 on evaluation today patient actually appears to be doing about the same in regard to her toes ulcer. She does have a small areas which seems to have a lot of callous buildup around the edge of  the wound which is going to need sharp debridement today. She still is waiting to be scheduled for evaluation with [employer] for possibility of an AFO. She states there supposed to call her tomorrow to get this set up. Unfortunately it does appear that her foot specifically the toe area is showing signs of erythema. There does not appear to be any systemic infection which is in these good news. [name], [name] ([id number]) 10/02/18 on evaluation today patient actually appears to be doing about the same in regard to her toe ulcer. This really has not done too well although it's not significantly larger it's also not significantly smaller. She has been tolerating the dressing changes without complication. She actually has her appointment with [employer] and [place] tomorrow to hopefully be measured for obtaining and AFO splint. I think this would be helpful preventing this from reoccurring. We had contemplated starting the cast this week although to be honest I am reluctant to do that as she's been having nausea, vomiting, and seizure activity over the past three days. She has a history of seizures and have been told is nothing that can  be done for these. With that being said I do believe that along with the seizures have the nausea vomiting which upon further questioning doesn't seem to be the normal for her and makes me concerned for the possibility of infection or something else going on. I discussed this with the patient and her mother during the office visit today. I do not feel the wound is effective but maybe something else. The responses this was "this just happens to her at times and we don't know why". They did not seem to be interested in going to the hospital to have this checked out further. 10/09/18 on evaluation today patient presents for follow-up concerning her ongoing toe ulcer. She has been tolerating the dressing changes without complication. Fortunately there does not appear to be any  evidence of infection which is great news however I do think that the patient would benefit from going ahead for with the total contact cast. She's actually in a wheelchair today she tells me that she will use her walker if we initiate the cast. I was very specific about the fact that if we were gonna do the cast I wanted to make sure that she was using the walker in order to prevent any falls. She tells me she does not have stairs that she has to traverse on a regular basis at her home. She has not had any seizures since last week again that something that happens to her often she tells me she did talk to [employer] and they said that it may take up to three weeks to get the brace approved for her. Hopefully that will not take that long but nonetheless in the meantime I do think the cast could be of benefit. 10/12/18 on evaluation today patient appears to be doing rather well in regard to her toe ulcer. It's just been a few days and already this is significantly improved both as far as overall appearance and size. Fortunately there's no sign of infection. She is here for her first obligatory cast change. 10/19/18 Seen today for follow up and management of left great toe ulcer. Wound continues to show improvement. Noted small open area with seroussang drainage with palpation. Denies any increased pain or recent fevers during visit. She will continue calcium alginate with offloading shoe. Denies any questions or concerns during visit. 10/26/18 on evaluation today patient appears to be doing about the same as when I last saw her in regard to her wound bed. Fortunately there does not appear to be any signs of infection. Unfortunately she continues to have a breakdown in regard to the toe region any time that she is not in the cast. It takes almost no time at all for this to happen. Nonetheless she still has not heard anything from the brace being made by [employer] as to when exactly this will be available to  her. Fortunately there is no signs of infection at this time. 10/30/18 on evaluation today patient presents for application of the total contact cast as we just received him this morning. Fortunately we are gonna be able to apply this to her today which is great news. She continues to have no significant pain which is good news. Overall I do feel like things have been improving while she was the cast is when she doesn't have a cast that things get worse. She still has not really heard anything from [place] regarding her brace. 11/02/18 upon evaluation today patient's wound already appears to be doing significantly  better which is good news. Fortunately there does not appear to be any signs of infection also good news. Overall I do think the total contact cast as before is helping to heal this area unfortunately it's just not gonna likely keep the area closed and healed without her getting her brace at least. Again the foot drop is a significant issue for her. 11/09/18 on evaluation today patient appears to be doing excellent in regard to her toe ulcer which in fact is completely healed. Fortunately we finally got the situation squared away with the paperwork which was needed to proceed with getting her brace approved by Medicaid. I have filled that out unfortunately that information has been sent to the orthopedic office that I worked at 2 1/2 years ago and not tired Current wound care measures. Fortunately she seems to be doing very well at this time. 11/23/18 on evaluation today patient appears to be doing More Poorly Compared to Last Time I Saw Her. At [facility] She Had Completely Healed. Currently she is continuing to have issues with reopening. She states that she just found out that the brace was approved through Medicaid now she just has to go get measured in order to have this fitted for her and then made. Subsequently she does not have an appointment for this yet that is going to complicate  things we obviously cannot put her back in the cast if we do not have everything measured because they're not gonna be able to measure her foot while she is in the cast. 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 Obviously this is unfortunate and does have me somewhat worried as well. 11/30/18 on evaluation today patient's toe ulcer actually appears to be doing fairly well. The good news is she will be getting her brace in the shoes next week on Wednesday. Hopefully we will be able to get this to heal without having to go back in the cast however she may need the cast in order to get the wound completely heal and then go from there. Fortunately there's no signs of infection at this time. 12/07/18 on evaluation today patient fortunately did receive her brace and she states she could tell this definitely makes her walk better. With that being said she's been having issues with her toe where she noticed yesterday there was a lot of tissue that was loosing off this appears to be much larger than what it was previous. She also states that her leg has been read putting much across the top of her foot just about the ankle although this seems to be receiving somewhat. The total area is still red and appears to be someone infected as best I can tell. She is previously taken Bactrim and that may be a good option for her today as well. We are gonna see what I wound culture shows as well and I think that this is definitely appropriate. With that being said outside of the culture I still need to initiate something in the interim and that's what I'm gonna go ahead and select Bactrim is a good option for her. 12/14/18 on evaluation today patient appears to be doing better in regard to her left great toe ulcer as compared to last week's evaluation. There's still some erythema although this is significantly improved which is  excellent news. Overall I do believe that she is making  good progress is still gonna take some time before she is where I would like her to be from the standpoint of being able to place her back into the total contact cast. Hopefully we will be where we need to be by next week. 12/21/18 on evaluation today patient actually appears to be doing poorly in regard to her toe ulcer. She's been tolerating the dressing changes without complication. Fortunately there's no signs of systemic infection although she does have a lot of drainage from the toe ulcer and this does seem to be causing some issues at this point. She does have erythema on the distal portion of her toe that appears to be likely cellulitis. 12/28/18 on evaluation today patient actually appears to be doing a little better in my pinion in regard to her toe ulcer. With that being said she still does have some evidence of infection at this time and for her culture she had both E. coli as well as enterococcus as organisms noted on evaluation. For that reason I think that though the Keflex likely has treated the E. coli rather well this has really done nothing for the enterococcus. We are going to have to initiate treatment for this specifically. [name], [name] ([id number]) 01/04/19 on evaluation today patient's toe actually appears to be doing better from the standpoint of infection. She currently would like to see about putting the cash back on I think that this is appropriate as long as she takes care of it and keeps it from getting wet. She is gonna have some drainage we can definitely pass this up with Drawtex and alginate to try to prevent as much drainage as possible from causing the problems. With that being said I do want to at least try her with the cast between now and Tuesday. If there any issues we can't continue to use it then I will discontinue the use of the cast at that point. 01/08/19 on evaluation today patient actually appears to  be doing very well as far as her foot ulcer specifically the great toe on the left is concerned. She did have an area of rubbing on the medial aspect of her left ankle which again is from the cast. Fortunately there's no signs of infection at this point in this appears to be a very slight skin breakdown. The patient tells me she felt it rubbing but didn't think it was that bad. Fortunately there is no signs of active infection at this time which is good news. No fevers, chills, nausea, or vomiting noted at this time. 01/15/19 on evaluation today patient actually appears to be doing well in regard to her toe ulcer. Again as previous she seems to do well and she has the cast on which indicates to me that during the time she doesn't have a cast on she's putting way too much pressure on this region. Obviously I think that's gonna be an issue as with the current national emergency concerning the Covid-19 Virus it has been recommended that we discontinue the use of total contact casting by the chief medical officer of our company, Dr. Simona Huh. The reasoning is that if a patient becomes sick and cannot come into have the cast removed they could not just leave this on for an additional two weeks. Obviously the hospitals also do not want to receive patient's who are sick into the emergency department to potentially contaminate the region and spread the Covid-19 Virus among other sick individuals within the hospital system. Therefore  at this point we are suspending the use of total contact cast until the current emergency subsides. This was all discussed with the patient today as well. 01/22/19 on evaluation today patient's wound on her left great toe appears to be doing slightly worse than previously noted last week. She tells me that she has been on this quite a bit in fact she tells me she's been awake for 38 straight hours. This is due to the fact that she's having to care for grandparents because nobody else  will. She has been taking care of them for five the last seven days since I've seen her they both have dementia his is from a stroke and her grandmother's was progressive. Nonetheless she states even her mom who knows her condition and situation has only help two of those days to take care of them she's been taking care of the rest. Fortunately there does not appear to be any signs of active infection in regard to her toe at this point although obviously it doesn't look as good as it did previous. I think this is directly related to her not taking off the pressure and friction by way of taking things easy. Though I completely understand what's going on. 01/29/19 on evaluation today patient's tools are actually appears to be showing some signs of improvement today compared to last week's evaluation as far as not necessarily the overall size of the wound but the fact that she has some new skin growth in between the two ends of the wound opening. Overall I feel like she has done well she states that she had a family member give her what sounds to be a CAM walker boot which has been helpful as well. 02/05/19 on evaluation today patient's wound bed actually appears to be doing significantly better in regard to her overall appearance of the size of the wound. With that being said she is still having an issue with offloading efficiently enough to get this to close. Apparently there is some signs of infection at this point as well unfortunately. Previously she's done well of Augmentin I really do not see anything that needs to be culture currently but there theme and cellulitis of the foot that I'm seeing I'm gonna go ahead and place her on an antibiotic today to try to help clear this up. 02/12/2019 on evaluation today patient actually appears to be doing poorly in regard to her overall wound status. She tells me she has been using her offloading shoe but actually comes in today wearing her tennis shoe with the  AFO brace. Again as I previously discussed with her this is really not sufficient to allow the area to heal appropriately. Nonetheless she continues to be somewhat noncompliant and I do wonder based on what she has told my nurse in the past as to whether or not a good portion of this noncompliance may be recreational drug and alcohol related. She has had a history of heroin overdose and this was fairly recently in the past couple of months that have been seeing her. Nonetheless overall I feel like her wound looks significantly worse today compared to what it was previous. She still has significant erythema despite the Augmentin I am not sure that this is an appropriate medication for her infection I am also concerned that the infection may have gone down into her bone. 02/19/19 on evaluation today patient actually appears to be doing about the same in regard to her toe ulcer. Unfortunately she continues  to show signs of bone exposure and infection at this point. There does not appear to be any evidence of worsening of the infection but I'm also not really sure that it's getting significantly better. She is on the Augmentin which should be sufficient for the Staphylococcus aureus infection that she has at this point. With that being said she may need IV antibiotics to more appropriately treat this. We did have a discussion today about hyperbaric option therapy. 02/28/19 on evaluation today patient actually appears to be doing much worse in regard to the wound on her left great toe as compared to even my previous evaluation last week. Unfortunately this seems to be training in a pretty poor direction. Her toe was actually now starting to angle laterally and I can actually see the entire joint area of the proximal portion of the digit where is the distal portion of the digit again is no longer even in contact with the joint line. Unfortunately there's a lot more necrotic tissue around the edge and the toe  appears to be showing signs of becoming gangrenous in my pinion. I'm very concerned about were things stand at this point. She did see infectious disease and they are planning to send in a prescription for Sivextro for her and apparently this has been approved. With that being said I don't think she should avoid taking this but at the same time I'm not sure that it's gonna be sufficient to save her toe at this point. 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 03/05/19 on evaluation today patient actually appears to be doing quite well in regard to her toe all things considering. She still has bone exposed but there appears to be much less your thing on overall the appearance of the wound and the toe itself is dramatically improved. She still does have some issues currently obviously with infection she did see vascular as well and there concerned that her blood flow to the toad. For that reason they are setting up for an angiogram next week. 03/14/19 on evaluation today patient appears to be doing very poor in regard to her toe and specifically in regard to the ulceration and the fact that she's starting to notice the toe was leaning even more towards the lateral aspect and the complete joint is visible on the proximal aspect of the joint. Nonetheless she's also noted a significant odor and the tip of the toe is turning more dark and necrotic appearing. Overall I think she is getting worse not better as far as this is concerned. For that reason I am recommending at this point that she likely needs to be seen for likely amputation. READMISSION 03/19/2021 This is a patient that we cared for in this clinic for a prolonged period of time in 2019 and 2020 with a left foot and left first toe wound. I  believe she ultimately became infected and underwent a left first toe amputation. 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 ([id number]) 04/09/20 by Dr. Luana Shu. In December 2021 she had an ulcer on her right great toe as well as the fourth and fifth toes. 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 She also had an angiogram  at that time and underwent angioplasty of the right anterior tibial artery. In any case she claims that the wound on the right foot is closed I did not look at this today which was probably an oversight although I think that should be done next week. After her surgery she developed a dehiscence but I do not see any follow-up of this. According to Dr. Deborra Medina last review that she was out of the area being cared for by another physician but recently came back to his attention. 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 A culture of this area showed E. coli apparently before she came back to see Dr. Luana Shu she was supposed to be receiving antibiotics but she did not really take them. Nor is she offloading this area at all. Finally her last hemoglobin A1c listed in epic was in March 2022 at 14.1 she says things are a lot better since then although I am not sure. 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 She had this wound at the time. During this admission she had E. coli sepsis a TEE was negative. 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 She is still a heavy cigarette smoker. She has a history of bipolar disorder seizure disorder ABI in our clinic was 1.05 6/1;  left midfoot in the setting of a TMA done previously. Round circular wound with a "knuckle" of protruding tissue. The problem is that the knuckle was not attached to any of the surrounding granulation and this probed proximally widely I removed a large portion of this tissue. This wound goes with considerable undermining laterally. I do not feel any bone there was no purulence but this is a deep wound. 6/8; in spite of the debridement I did last week. She arrives with a wound looking exactly the same. A protruding "knuckle" of tissue nonadherent to most of the surrounding tissue. There is considerable depth around this from 6-12 o'clock at 2.7 cm and undermining of 1 cm. This does not look overtly infected and the x-ray I did last week was negative for any osseous abnormalities. We have been using silver collagen 6/15; deep tissue culture I did last week showed moderate staph aureus and moderate Pseudomonas. This will definitely require prolonged antibiotic therapy. The pathology on the protuberant area was negative for malignancy fungus etc. 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 We have been using silver collagen. I am going to be prescribing Levaquin for 2 weeks. Her CT scan of the foot is down for 7/5 6/22; CT scan of the foot on 7 5. She says she has hardware in the left leg from her previous fracture. She is on the Levaquin for the deep tissue culture I did that showed methicillin sensitive staph aureus and Pseudomonas. I gave her a 2-week supply and she will have another week. She arrives in clinic today with the same protuberant tissue however this is nonadherent to the tissue surrounding it. I am really at a loss to explain this unless there is underlying deep tissue infection 6/29; patient presents for 1 week follow-up. She has been using collagen to the wound bed. She reports taking her antibiotics as prescribed.She has  no  complaints or issues today. She denies signs of infection. 7/6; patient presents for one week followup. She has been using collagen to the wound bed. She states she is taking Levaquin however at times she is not able to keep it down. She denies signs of infection. 7/13; patient presents for 1 week follow-up. She has been using silver alginate to the wound bed. She still has nausea when taking her antibiotics. She denies signs of infection. 7/20; patient presents for 1 week follow-up. She has been using silver alginate with gentamicin cream to the wound bed. She denies any issues and has no complaints today. She denies signs of infection. 7/27; patient presents for 1 week follow-up. She continues to use silver alginate with gentamicin cream to the wound bed. She reports starting her antibiotics. She has no issues or complaints. Overall she reports stability to the wound. 8/3; patient presents for 1 week follow-up. She has been using silver alginate with gentamicin cream to the wound bed. She reports completing all antibiotics. She has no issues or complaints today. She denies signs of infection. 8/17; patient presents for 2-week follow-up. He is to use silver alginate to the wound bed. She has no issues or complaints today. She denies signs of infection. She reports her pain has improved in her foot since last clinic visit 8/24; patient presents for 1 week follow-up. She continues to use silver alginate to the wound bed. She has no issues or complaints. She denies signs of infection. Pain is stable. 9/7; patient presents for follow-up. She missed her last week appointment due to feeling ill. She continues to use silver alginate. She has a new wound to the right lower extremity that is covered in eschar. She states It occurred over the past week and has no idea how it started. She currently denies signs of infection. 9/14; patient presents for follow-up. To the left foot wound she has been using  gentamicin cream and silver alginate. To the right lower extremity wound she has been keeping this covered and has not obtain Santyl. Electronic Signature(s) Signed: 07/07/2021 12:11:09 PM By: Kalman Shan DO Entered By: Kalman Shan on 07/07/2021 12:03:13 [name] ([id number]) -------------------------------------------------------------------------------- Physical Exam Details Patient Name: Heather Dear A. Date of Service: 07/07/2021 10:45 AM Medical Record Number: [id number] Patient Account Number: [id number] Date of Birth/Sex: 1976/05/14 (45 y.o. F) Treating RN: Dolan Amen Primary Care Provider: Raelene Bott Other Clinician: Referring Provider: Raelene Bott Treating Provider/Extender: Yaakov Guthrie in Treatment: 15 Constitutional . Psychiatric . Notes Left foot (Transmetatarsal amputation): Wound to the plantar aspect that that has granulation tissue and nonviable tissue present. There is significant callus circumferentially. There is increased redness and warmth to part of the periwound. No purulent drainage. Right lower extremity: To the anterior aspect there is an eschar present. Post debridement there is still significant nonviable debris tightly adhered. Electronic Signature(s) Signed: 07/07/2021 12:11:09 PM By: Kalman Shan DO Entered By: Kalman Shan on 07/07/2021 12:04:54 [name] ([id number]) -------------------------------------------------------------------------------- Physician Orders Details Patient Name: Heather Dear A. Date of Service: 07/07/2021 10:45 AM Medical Record Number: [id number] Patient Account Number: [id number] Date of Birth/Sex: Mar 07, 1976 (45 y.o. F) Treating RN: Dolan Amen Primary Care Provider: Raelene Bott Other Clinician: Referring Provider: Raelene Bott Treating Provider/Extender: Yaakov Guthrie in Treatment: 15 Verbal / Phone Orders: No Diagnosis Coding Follow-up  Appointments o Return Appointment in 1 week. Bathing/ Shower/ Hygiene o Clean wound with Normal Saline or wound cleanser. Off-Loading Left Lower Extremity o Open  toe surgical shoe with peg assist. o Turn and reposition every 2 hours Wound Treatment Wound #7 - Foot Wound Laterality: Plantar, Left, Midline Cleanser: Soap and Water 3 x Per Week/30 Days Discharge Instructions: Gently cleanse wound with antibacterial soap, rinse and pat dry prior to dressing wounds Peri-Wound Care: Desitin Maximum Strength Ointment 4 (oz) 3 x Per Week/30 Days Discharge Instructions: Peri wound Topical: Gentamicin 3 x Per Week/30 Days Discharge Instructions: Apply to wound bed and tunnel area under the dressing Primary Dressing: Silvercel Small 2x2 (in/in) 3 x Per Week/30 Days Discharge Instructions: Apply to wound bed Secondary Dressing: ABD Pad 5x9 (in/in) 3 x Per Week/30 Days Discharge Instructions: Cover with ABD pad on top of dressing Secondary Dressing: Gauze 3 x Per Week/30 Days Discharge Instructions: To hold dressing in place Secured With: Kerlix Roll Sterile or Non-Sterile 6-ply 4.5x4 (yd/yd) 3 x Per Week/30 Days Discharge Instructions: Apply Kerlix as directed Wound #8 - Lower Leg Wound Laterality: Right, Anterior Cleanser: Normal Saline 1 x Per Day/30 Days Discharge Instructions: Wash your hands with soap and water. Remove old dressing, discard into plastic bag and place into trash. Cleanse the wound with Normal Saline prior to applying a clean dressing using gauze sponges, not tissues or cotton balls. Do not scrub or use excessive force. Pat dry using gauze sponges, not tissue or cotton balls. Topical: Santyl Collagenase Ointment, 30 (gm), tube 1 x Per Day/30 Days Discharge Instructions: apply nickel thick to wound bed only Secondary Dressing: [place] Dressing, 4x4 (in/in) 1 x Per Day/30 Days Discharge Instructions: Apply over dressing to secure in place. Patient  Medications Allergies: erythromycin base Notifications Medication Indication Start End clindamycin HCl 07/07/2021 DOSE 1 - oral 300 mg capsule - 1 capsule oral QID for 10 days [name], [name] ([id number]) Electronic Signature(s) Signed: 07/07/2021 12:10:22 PM By: Kalman Shan DO Entered By: Kalman Shan on 07/07/2021 12:10:22 [name] ([id number]) -------------------------------------------------------------------------------- Problem List Details Patient Name: Heather Dear A. Date of Service: 07/07/2021 10:45 AM Medical Record Number: [id number] Patient Account Number: [id number] Date of Birth/Sex: Dec 19, 1975 (45 y.o. 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 ([id number]) -------------------------------------------------------------------------------- Progress Note Details Patient Name: Heather Dear A. Date of Service: 07/07/2021 10:45 AM Medical Record Number: [id number] Patient Account Number: [id number] Date of Birth/Sex: April 28, 1976 (45 y.o. F) Treating RN: Dolan Amen Primary Care  Provider: Raelene Bott Other Clinician: Referring Provider: Raelene Bott Treating Provider/Extender: Yaakov Guthrie in Treatment: 15 Subjective Chief Complaint Information obtained from Patient Left great toe ulcer 03/19/2021; patient referred by Dr. Luana Shu who has been looking after her left foot for quite a period of time for review of a nonhealing area in the left midfoot History of Present Illness (HPI) 01/18/18-She is here for initial evaluation of the left great toe ulcer. She  is a poor historian in regards to timeframe in detail. She states approximately 4 weeks ago she lacerated her toe on something in the house. She followed up with her primary care who placed her on Bactrim and ultimately a second dose of Bactrim prior to coming to wound clinic. She states she has been treating the toe with peroxide, Betadine and a Band-Aid. She did not check her blood sugar this morning but checked it yesterday morning it was 327; she is unaware of a recent A1c and there are no current records. She saw Dr. she would've orthopedics last week for an old injury to the left ankle, she states he did not see her toe, nor did she bring it to his attention. She smokes approximately 1 pack cigarettes a day. Her social situation is concerning, she arrives this morning with her mother who appears extremely intoxicated/under the influence; her mother was asked to leave the room and be monitored by the patient's grandmother. The patient's aunt then accompanied the patient and the room throughout the rest of the appointment. We had a lengthy discussion regarding the deleterious effects of uncontrolled hyperglycemia and smoking as it relates to wound healing and overall health. She was strongly encouraged to decrease her smoking and get her diabetes under better control. She states she is currently on a diet and has cut down her [facility] consumption. The left toe is erythematous, macerated and slightly  edematous with malodor present. The edema in her left foot is below her baseline, there is no erythema streaking. We will treat her with Santyl, doxycycline; we have ordered and xray, culture and provided a Peg assist surgical shoe and cultured the wound. 01/25/18-She is here in follow-up evaluation for a left great toe ulcer and presents with an abscess to her suprapubic area. She states her blood sugars remain elevated, feeling "sick" and if levels are below 250, but she is trying. She has made no attempt to decrease her smoking stating that we "can't take away her food in her cigarettes". She has been compliant with offloading using the PEG assist you. She is using Santyl daily. the culture obtained last week grew staph aureus and Enterococcus faecalis; continues on the doxycycline and Augmentin was added on Monday. The suprapubic area has erythema, no femoral variation, purple discoloration, minimal induration, was accessed with a cotton tip applicator with sanguinopurulent drainage, this was cultured, I suspect the current antibiotic treatment will cover and we will not add anything to her current treatment plan. She was advised to go to urgent care or ER with any change in redness, induration or fever. 02/01/18-She is here in follow-up evaluation for left great toe ulcers and a new abdominal abscess from last week. She was able to use packing until earlier this week, where she "forgot it was there". She states she was feeling ill with GI symptoms last week and was not taking her antibiotic. She states her glucose levels have been predominantly less than 200, with occasional levels between 200-250. She thinks this was contributing to her GI symptoms as they have resolved without intervention. There continues to be significant laceration to left toe, otherwise it clinically looks stable/improved. There is now less superficial opening to the lateral aspect of the great toe that was residual blister. We  will transition to [facility] to all wounds, she will continue her Augmentin. If there is no change or deterioration next week for reculture. 02/08/18-She is here in follow-up evaluation for left great toe ulcer  and abdominal ulcer. There is an improvement in both wounds. She has been wrapping her left toe with coban, not by our direction, which has created an area of discoloration to the medial aspect; she has been advised to NOT use coban secondary to her neuropathy. She states her glucose levels have been high over this last week ranging from 200-350, she continues to smoke. She admits to being less compliant with her offloading shoe. We will continue with same treatment plan and she will follow-up next week. 02/15/18-She is here in follow-up evaluation for left great toe ulcer and abdominal ulcer. The abdominal ulcer is epithelialized. The left great toe ulcer is improved and all injury from last week using the Coban wrap is resolved, the lateral ulcer is healed. She admits to noncompliance with wearing offloading shoe and admits to glucose levels being greater than 300 most of the week. She continues to smoke and expresses no desire to quit. There is one area medially that probes deeper than it has historically, erythema to the toe and dorsal foot has consistently waxed and waned. There is no overt signs of cellulitis or infection but we will culture the wound for any occult infection given the new area of depth and erythema. We will hold off on sensitivities for initiation of antibiotic therapy. 02/22/18-She is here in follow up evaluation for left great toe ulcer. There is overall significant improvement in both wound appearance, erythema and edema with changes made last week. She was not initiated on antibiotic therapy. Culture obtained last week showed oxacillin sensitive staph aureus, sensitive to clindamycin. Clindamycin has been called into the pharmacy but she has been instructed to hold  off on initiation secondary to overall clinical improvement and her history of antibiotic intolerance. She has been instructed to contact the clinic with any noted changes/deterioration and the wound, erythema, edema and/or pain. She will follow-up next week. She continues to smoke and her glucose levels remain elevated >250; she admits to compliance with offloading shoe 03/01/18 on evaluation today patient appears to be doing fairly well in regard to her left first toe ulcer. She has been tolerating the dressing changes with the [facility] Dressing without complication and overall this has definitely showed signs of improvement according to records as well is what the patient tells me today. I'm very pleased in that regard. She is having no pain today 03/08/18 She is here for follow up evaluation of a left great toe ulcer. She remains non-compliant with glucose control and smoking cessation; glucose levels consistently >200. She states that she got new shoe inserts/peg assist. She admits to compliance with offloading. Since my last evaluation there is significant improvement. We will switch to prisma at this time and she will follow up next week. She is noted to be tachycardic at this appointment, heart rate 120s; she has a history of heart rate 70-130 according to our records. She admits to extreme agitation r/t personal issues; she was advised to monitor her heartrate and contact her physician if it does not return to a more normal range (<100). She takes cardizem twice daily. 03/15/18-She is here in follow-up evaluation for left great toe ulcer. She remains noncompliant with glucose control and smoking cessation. She admits to compliance with wearing offloading shoe. The ulcer is improved/stable and we will continue with the same treatment plan and she will follow-up next week 03/22/18-She is here for evaluation for left great toe ulcer. There continues to be significant improvement despite  recurrent hyperglycemia (  over 500 yesterday) and she continues to smoke. She has been compliant with offloading and we will continue with same treatment plan and she will follow-up next week. [name], [name] ([id number]) 03/29/18-She is here for evaluation for left great toe ulcer. Despite continuing to smoke and uncontrolled diabetes she continues to improve. She is compliant with offloading shoe. We will continue with the same treatment plan and she will follow-up next week 04/05/18- She is here in follow up evaluation for a left great toe ulcer; she presents with small pustule to left fifth toe (resembles ant bite). She admits to compliance with wearing offloading shoe; continues to smoke or have uncontrolled blood glucose control. There is more callus than usual with evidence of bleeding; she denies known trauma. 04/12/18-She is here for evaluation of left great toe ulcer. Despite noncompliance with glycemic control and smoking she continues to make improvement. She continues to wear offloading shoe. The pustule, that was identified last week, to the left fifth toe is resolved. She will follow-up in 2 weeks 05/03/18-she is seen in follow-up evaluation for a left great toe ulcer. She is compliant with offloading, otherwise noncompliant with glycemic control and smoking. She has plateaued and there is minimal improvement noted. We will transition to [facility], replaced the insert to her surgical shoe and she will follow-up in one week 05/10/18- She is here in follow up evaluation for a left great toe ulcer. It appears stable despite measurement change. We will continue with same treatment plan and follow up next week. 05/24/18-She is seen in follow-up evaluation for a left great toe ulcer. She remains compliant with offloading, has made significant improvement in her diet, decreasing the amount of sugar/soda. She said her recent A1c was 10.9 which is lower than. She did see a diabetic  nutritionist/educator yesterday. She continues to smoke. We will continue with the same treatment plan and she'll follow-up next week. 05/31/18- She is seen in follow-up evaluation for left great toe ulcer. She continues to remain compliant with offloading, continues to make improvement in her diet, increasing her water and decreasing the amount of sugar/soda. She does continue to smoke with no desire to quit. We will apply Prisma to the depth and Hydrofera Blue over. We have not received insurance authorization for oasis. She will follow up next week. 06/07/18-She is seen in follow-up evaluation for left great toe ulcer. It has stalled according to today's measurements although base appears stable. She says she saw a diabetic educator yesterday; her average blood sugars are less than 300 which is an improvement for her. She continues to smoke and states "that's my next step" She continues with water over soda. We will order for xray, culture and reinstate ace wrap compression prior to placing apligraf for next week. She is voicing no complaints or concerns. Her dressing will change to iodoflex over the next week in preparation for apligraf. 06/14/18-She is seen in follow-up evaluation for left great toe ulcer. Plain film x-ray performed last week was negative for osteomyelitis. Wound culture obtained last week grew strep B and OSSA; she is initiated on keflex and cefdinir today; there is erythema to the toe which could be from ace wrap compression, she has a history of wrapping too tight and has has been encouraged to maintain ace wraps that we place today. We will hold off on application of apligraf today, will apply next week after antibiotic therapy has been initiated. She admits today that she has resumed taking a shower with  her foot/toe submerged in water, she has been reminded to keep foot/toe out of the bath water. She will be seen in follow up next week 06/21/18-she is seen in follow-up  evaluation for left great toe ulcer. She is tolerating antibiotic therapy with no GI disturbance. The wound is stable. Apligraf was applied today. She has been decreasing her smoking, only had 4 cigarettes yesterday and 1 today. She continues being more compliant in diabetic diet. She will follow-up next week for evaluation of site, if stable will remove at 2 weeks. 06/28/18- She is here in follow up evalution. Apligraf was placed last week, she states the dressing fell off on Tuesday and she was dressing with hydrofera blue. She is healed and will be discharged from the clinic today. She has been instructed to continue with smoking cessation, continue monitoring glucose levels, offloading for an additional 4 weeks and continue with hydrofera blue for additional two weeks for any possible microscopic opening. Readmission: 08/07/18 on evaluation today patient presents for reevaluation concerning the ulcer of her right great toe. She was previously discharged on 06/28/18 healed. Nonetheless she states that this began to show signs of drainage she subsequently went to her primary care provider. Subsequently an x-ray was performed on 08/01/18 which was negative. The patient was also placed on antibiotics at that time. Fortunately they should have been effective for the infection. Nonetheless she's been experiencing some improvement but still has a lot of drainage coming from the wound itself. 08/14/18 on evaluation today patient's wound actually does show signs of improvement in regard to the erythema at this point. She has completed the antibiotics. With that being said we did discuss the possibility of placing her in a total contact cast as of today although I think that I may want to give this just a little bit more time to ensure nothing recurrence as far as her infection is concerned. I do not want to put in the cast and risk infection at that time if things are not completely resolved. With that being  said she is gonna require some debridement today. 08/21/18 on evaluation today patient actually appears to be doing okay in regard to her toe ulcer. She's been tolerating the dressing changes without complication. With that being said it does appear that she is ready and in fact I think it's appropriate for [place] to go ahead and initiate the total contact cast today. Nonetheless she will require some sharp debridement to prepare the wound for application. Overall I feel like things have been progressing well but we do need to do something to get this to close more readily. 08/24/18 patient seen today for reevaluation after having had the total contact cast applied on Tuesday. She seems to have done very well the wound appears to be doing great and overall I'm pleased with the progress that she's made. There were no abnormal areas of rubbing from the cast on her lower extremity. 08/30/18 on evaluation today patient actually appears to be completely healed in regard to her plantar toe ulcer. She tells me at this point she's been having a lot of issues with the cast. She almost fell a couple of times the state shall the step of her dog a couple times as well. This is been a very frustrating process for her other nonetheless she has completely healed the wound which is excellent news. Overall there does not appear to be the evidence of infection at this time which is great news. 09/11/18 evaluation  today patient presents for follow-up concerning her great toe ulcer on the left which has unfortunately reopened since I last saw her which was only a couple of weeks ago. Unfortunately she was not able to get in to get the shoe and potentially the AFO that's gonna be necessary due to her left foot drop. She continues with offloading shoe but this is not enough to prevent her from reopening it appears. When we last had her in the total contact cast she did well from a healing standpoint but unfortunately the wound  reopened as soon as she came out of the cast within just a couple of weeks. 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 This is also part of the reason why she falls on a regular basis. I just do not believe that is gonna be safe for [place] to reinitiate the total contact cast as last time we had this on she fell 3 times one week which is definitely not normal for her. 09/18/18 upon evaluation today the patient actually appears to be doing about the same in regard to her toe ulcer. She did not contact [facility] as I asked her to even though I had given her the prescription. In fact she actually states that she has no idea where the prescription is. She did apparently call [facility] and they told her that all she needed to do was bring the prescription in order to be able to be seen and work on getting the AFO for her left foot. With all that being said she still does not have an appointment and I'm not sure were things stand that regard. I will give her a new prescription today in order to contact them to get this set up. [name], MARTUCCI A. ([id number]) 09/25/18 on evaluation today patient actually appears to be doing about the same in regard to her toes ulcer. She does have a small areas which seems to have a lot of callous buildup around the edge of the wound which is going to need sharp debridement today. She still is waiting to be scheduled for evaluation with [facility] for possibility of an AFO. She states there supposed to call her tomorrow to get this set up. Unfortunately it does appear that her foot specifically the toe area is showing signs of erythema. There does not appear to be any systemic infection which is in these good news. 10/02/18 on evaluation today patient actually appears to be doing about the  same in regard to her toe ulcer. This really has not done too well although it's not significantly larger it's also not significantly smaller. She has been tolerating the dressing changes without complication. She actually has her appointment with [facility] and [place] tomorrow to hopefully be measured for obtaining and AFO splint. I think this would be helpful preventing this from reoccurring. We had contemplated starting the cast this week although to be honest I am reluctant to do that as she's been having nausea, vomiting, and seizure activity over the past three days. She has a history of seizures and have been told is nothing that can be done for these. With that being said I do believe that along with the seizures have the nausea vomiting which upon further questioning doesn't seem to be the normal  for her and makes me concerned for the possibility of infection or something else going on. I discussed this with the patient and her mother during the office visit today. I do not feel the wound is effective but maybe something else. The responses this was "this just happens to her at times and we don't know why". They did not seem to be interested in going to the hospital to have this checked out further. 10/09/18 on evaluation today patient presents for follow-up concerning her ongoing toe ulcer. She has been tolerating the dressing changes without complication. Fortunately there does not appear to be any evidence of infection which is great news however I do think that the patient would benefit from going ahead for with the total contact cast. She's actually in a wheelchair today she tells me that she will use her walker if we initiate the cast. I was very specific about the fact that if we were gonna do the cast I wanted to make sure that she was using the walker in order to prevent any falls. She tells me she does not have stairs that she has to traverse on a regular basis at her home. She has  not had any seizures since last week again that something that happens to her often she tells me she did talk to [employer] and they said that it may take up to three weeks to get the brace approved for her. Hopefully that will not take that long but nonetheless in the meantime I do think the cast could be of benefit. 10/12/18 on evaluation today patient appears to be doing rather well in regard to her toe ulcer. It's just been a few days and already this is significantly improved both as far as overall appearance and size. Fortunately there's no sign of infection. She is here for her first obligatory cast change. 10/19/18 Seen today for follow up and management of left great toe ulcer. Wound continues to show improvement. Noted small open area with seroussang drainage with palpation. Denies any increased pain or recent fevers during visit. She will continue calcium alginate with offloading shoe. Denies any questions or concerns during visit. 10/26/18 on evaluation today patient appears to be doing about the same as when I last saw her in regard to her wound bed. Fortunately there does not appear to be any signs of infection. Unfortunately she continues to have a breakdown in regard to the toe region any time that she is not in the cast. It takes almost no time at all for this to happen. Nonetheless she still has not heard anything from the brace being made by [employer] as to when exactly this will be available to her. Fortunately there is no signs of infection at this time. 10/30/18 on evaluation today patient presents for application of the total contact cast as we just received him this morning. Fortunately we are gonna be able to apply this to her today which is great news. She continues to have no significant pain which is good news. Overall I do feel like things have been improving while she was the cast is when she doesn't have a cast that things get worse. She still has not really heard anything from  [facility] regarding her brace. 11/02/18 upon evaluation today patient's wound already appears to be doing significantly better which is good news. Fortunately there does not appear to be any signs of infection also good news. Overall I do think the total contact cast as before is  helping to heal this area unfortunately it's just not gonna likely keep the area closed and healed without her getting her brace at least. Again the foot drop is a significant issue for her. 11/09/18 on evaluation today patient appears to be doing excellent in regard to her toe ulcer which in fact is completely healed. Fortunately we finally got the situation squared away with the paperwork which was needed to proceed with getting her brace approved by Medicaid. I have filled that out unfortunately that information has been sent to the orthopedic office that I worked at 2 1/2 years ago and not tired Current wound care measures. Fortunately she seems to be doing very well at this time. 11/23/18 on evaluation today patient appears to be doing More Poorly Compared to Last Time I Saw Her. At [facility] She Had Completely Healed. Currently she is continuing to have issues with reopening. She states that she just found out that the brace was approved through Medicaid now she just has to go get measured in order to have this fitted for her and then made. Subsequently she does not have an appointment for this yet that is going to complicate things we obviously cannot put her back in the cast if we do not have everything measured because they're not gonna be able to measure her foot while she is in the cast. 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 Obviously this is unfortunate and does have me somewhat worried as well. 11/30/18 on evaluation today patient's toe ulcer actually appears to be doing fairly well. The good news is she will be  getting her brace in the shoes next week on Wednesday. Hopefully we will be able to get this to heal without having to go back in the cast however she may need the cast in order to get the wound completely heal and then go from there. Fortunately there's no signs of infection at this time. 12/07/18 on evaluation today patient fortunately did receive her brace and she states she could tell this definitely makes her walk better. With that being said she's been having issues with her toe where she noticed yesterday there was a lot of tissue that was loosing off this appears to be much larger than what it was previous. She also states that her leg has been read putting much across the top of her foot just about the ankle although this seems to be receiving somewhat. The total area is still red and appears to be someone infected as best I can tell. She is previously taken Bactrim and that may be a good option for her today as well. We are gonna see what I wound culture shows as well and I think that this is definitely appropriate. With that being said outside of the culture I still need to initiate something in the interim and that's what I'm gonna go ahead and select Bactrim is a good option for her. 12/14/18 on evaluation today patient appears to be doing better in regard to her left great toe ulcer as compared to last week's evaluation. There's still some erythema although this is significantly improved which is excellent news. Overall I do believe that she is making good progress is still gonna take some time before she is where I would like her to be from the standpoint of being able to place her back into the total  contact cast. Hopefully we will be where we need to be by next week. 12/21/18 on evaluation today patient actually appears to be doing poorly in regard to her toe ulcer. She's been tolerating the dressing changes without complication. Fortunately there's no signs of systemic infection  although she does have a lot of drainage from the toe ulcer and this does [name], [name] ([id number]) seem to be causing some issues at this point. She does have erythema on the distal portion of her toe that appears to be likely cellulitis. 12/28/18 on evaluation today patient actually appears to be doing a little better in my pinion in regard to her toe ulcer. With that being said she still does have some evidence of infection at this time and for her culture she had both E. coli as well as enterococcus as organisms noted on evaluation. For that reason I think that though the Keflex likely has treated the E. coli rather well this has really done nothing for the enterococcus. We are going to have to initiate treatment for this specifically. 01/04/19 on evaluation today patient's toe actually appears to be doing better from the standpoint of infection. She currently would like to see about putting the cash back on I think that this is appropriate as long as she takes care of it and keeps it from getting wet. She is gonna have some drainage we can definitely pass this up with Drawtex and alginate to try to prevent as much drainage as possible from causing the problems. With that being said I do want to at least try her with the cast between now and Tuesday. If there any issues we can't continue to use it then I will discontinue the use of the cast at that point. 01/08/19 on evaluation today patient actually appears to be doing very well as far as her foot ulcer specifically the great toe on the left is concerned. She did have an area of rubbing on the medial aspect of her left ankle which again is from the cast. Fortunately there's no signs of infection at this point in this appears to be a very slight skin breakdown. The patient tells me she felt it rubbing but didn't think it was that bad. Fortunately there is no signs of active infection at this time which is good news. No fevers, chills, nausea, or  vomiting noted at this time. 01/15/19 on evaluation today patient actually appears to be doing well in regard to her toe ulcer. Again as previous she seems to do well and she has the cast on which indicates to me that during the time she doesn't have a cast on she's putting way too much pressure on this region. Obviously I think that's gonna be an issue as with the current national emergency concerning the Covid-19 Virus it has been recommended that we discontinue the use of total contact casting by the chief medical officer of our company, Dr. Simona Huh. The reasoning is that if a patient becomes sick and cannot come into have the cast removed they could not just leave this on for an additional two weeks. Obviously the hospitals also do not want to receive patient's who are sick into the emergency department to potentially contaminate the region and spread the Covid-19 Virus among other sick individuals within the hospital system. Therefore at this point we are suspending the use of total contact cast until the current emergency subsides. This was all discussed with the patient today as well. 01/22/19 on evaluation  today patient's wound on her left great toe appears to be doing slightly worse than previously noted last week. She tells me that she has been on this quite a bit in fact she tells me she's been awake for 38 straight hours. This is due to the fact that she's having to care for grandparents because nobody else will. She has been taking care of them for five the last seven days since I've seen her they both have dementia his is from a stroke and her grandmother's was progressive. Nonetheless she states even her mom who knows her condition and situation has only help two of those days to take care of them she's been taking care of the rest. Fortunately there does not appear to be any signs of active infection in regard to her toe at this point although obviously it doesn't look as good as it did  previous. I think this is directly related to her not taking off the pressure and friction by way of taking things easy. Though I completely understand what's going on. 01/29/19 on evaluation today patient's tools are actually appears to be showing some signs of improvement today compared to last week's evaluation as far as not necessarily the overall size of the wound but the fact that she has some new skin growth in between the two ends of the wound opening. Overall I feel like she has done well she states that she had a family member give her what sounds to be a CAM walker boot which has been helpful as well. 02/05/19 on evaluation today patient's wound bed actually appears to be doing significantly better in regard to her overall appearance of the size of the wound. With that being said she is still having an issue with offloading efficiently enough to get this to close. Apparently there is some signs of infection at this point as well unfortunately. Previously she's done well of Augmentin I really do not see anything that needs to be culture currently but there theme and cellulitis of the foot that I'm seeing I'm gonna go ahead and place her on an antibiotic today to try to help clear this up. 02/12/2019 on evaluation today patient actually appears to be doing poorly in regard to her overall wound status. She tells me she has been using her offloading shoe but actually comes in today wearing her tennis shoe with the AFO brace. Again as I previously discussed with her this is really not sufficient to allow the area to heal appropriately. Nonetheless she continues to be somewhat noncompliant and I do wonder based on what she has told my nurse in the past as to whether or not a good portion of this noncompliance may be recreational drug and alcohol related. She has had a history of heroin overdose and this was fairly recently in the past couple of months that have been seeing her. Nonetheless overall I  feel like her wound looks significantly worse today compared to what it was previous. She still has significant erythema despite the Augmentin I am not sure that this is an appropriate medication for her infection I am also concerned that the infection may have gone down into her bone. 02/19/19 on evaluation today patient actually appears to be doing about the same in regard to her toe ulcer. Unfortunately she continues to show signs of bone exposure and infection at this point. There does not appear to be any evidence of worsening of the infection but I'm also not really sure  that it's getting significantly better. She is on the Augmentin which should be sufficient for the Staphylococcus aureus infection that she has at this point. With that being said she may need IV antibiotics to more appropriately treat this. We did have a discussion today about hyperbaric option therapy. 02/28/19 on evaluation today patient actually appears to be doing much worse in regard to the wound on her left great toe as compared to even my previous evaluation last week. Unfortunately this seems to be training in a pretty poor direction. Her toe was actually now starting to angle laterally and I can actually see the entire joint area of the proximal portion of the digit where is the distal portion of the digit again is no longer even in contact with the joint line. Unfortunately there's a lot more necrotic tissue around the edge and the toe appears to be showing signs of becoming gangrenous in my pinion. I'm very concerned about were things stand at this point. She did see infectious disease and they are planning to send in a prescription for Sivextro for her and apparently this has been approved. With that being said I don't think she should avoid taking this but at the same time I'm not sure that it's gonna be sufficient to save her toe at this point. She tells me that she still having to care for grandparents which I think  is putting quite a bit of strain on her foot and specifically the total area and has caused this to break down even to a greater degree than would've otherwise been expected. 03/05/19 on evaluation today patient actually appears to be doing quite well in regard to her toe all things considering. She still has bone exposed but there appears to be much less your thing on overall the appearance of the wound and the toe itself is dramatically improved. She still does have some issues currently obviously with infection she did see vascular as well and there concerned that her blood flow to the toad. For that reason they are setting up for an angiogram next week. 03/14/19 on evaluation today patient appears to be doing very poor in regard to her toe and specifically in regard to the ulceration and the fact that she's starting to notice the toe was leaning even more towards the lateral aspect and the complete joint is visible on the proximal aspect of the joint. Nonetheless she's also noted a significant odor and the tip of the toe is turning more dark and necrotic appearing. Overall I think she is getting worse not better as far as this is concerned. For that reason I am recommending at this point that she likely needs to be seen for likely amputation. KIAUNDRA, KAWAMURA ([id number]) READMISSION 03/19/2021 This is a patient that we cared for in this clinic for a prolonged period of time in 2019 and 2020 with a left foot and left first toe wound. 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 Luana Shu. In December 2021 she had an ulcer on her right great toe as well as the fourth and fifth toes. 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 She also had an angiogram at that time and underwent angioplasty of the right anterior tibial artery. In any case she claims that  the wound on the right foot is closed I did not look  at this today which was probably an oversight although I think that should be done next week. After her surgery she developed a dehiscence but I do not see any follow-up of this. According to Dr. Deborra Medina last review that she was out of the area being cared for by another physician but recently came back to his attention. 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 A culture of this area showed E. coli apparently before she came back to see Dr. Luana Shu she was supposed to be receiving antibiotics but she did not really take them. Nor is she offloading this area at all. Finally her last hemoglobin A1c listed in epic was in March 2022 at 14.1 she says things are a lot better since then although I am not sure. 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 She had this wound at the time. During this admission she had E. coli sepsis a TEE was negative. 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 She is still a heavy cigarette smoker. She has a history of bipolar disorder seizure disorder ABI in our clinic was 1.05 6/1; left midfoot in the setting of a TMA done previously. Round circular wound with a "knuckle" of protruding tissue. The problem is that the knuckle was not attached to any of the surrounding granulation and this probed proximally widely I removed a large portion of this tissue. This wound goes with considerable undermining laterally. I do not feel any bone there was no purulence but this is a deep wound. 6/8; in spite of the debridement I did last week. She arrives with a wound looking exactly the  same. A protruding "knuckle" of tissue nonadherent to most of the surrounding tissue. There is considerable depth around this from 6-12 o'clock at 2.7 cm and undermining of 1 cm. This does not look overtly infected and the x-ray I did last week was negative for any osseous abnormalities. We have been using silver collagen 6/15; deep tissue culture I did last week showed moderate staph aureus and moderate Pseudomonas. This will definitely require prolonged antibiotic therapy. The pathology on the protuberant area was negative for malignancy fungus etc. 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 We have been using silver collagen. I am going to be prescribing Levaquin for 2 weeks[address] Her [address] scan of the foot is down for 7/5 6/22; CT scan of the foot on 7 5. She says she has hardware in the left leg from her previous fracture. She is on the Levaquin for the deep tissue culture I did that showed methicillin sensitive staph aureus and Pseudomonas. I gave her a 2-week supply and she will have another week. She arrives in clinic today with the same protuberant tissue however this is nonadherent to the tissue surrounding it. I am really at a loss to explain this unless there is underlying deep tissue infection 6/29; patient presents for 1 week follow-up. She has been using collagen to the wound bed. She reports taking her antibiotics as prescribed.She has no complaints or issues today. She denies signs of infection. 7/6; patient presents for one week followup. She has been using collagen to the wound bed. She states she is  taking Levaquin however at times she is not able to keep it down. She denies signs of infection. 7/13; patient presents for 1 week follow-up. She has been using silver alginate to the wound bed. She still has nausea when taking her antibiotics. She denies signs of infection. 7/20; patient presents for 1 week follow-up. She has been  using silver alginate with gentamicin cream to the wound bed. She denies any issues and has no complaints today. She denies signs of infection. 7/27; patient presents for 1 week follow-up. She continues to use silver alginate with gentamicin cream to the wound bed. She reports starting her antibiotics. She has no issues or complaints. Overall she reports stability to the wound. 8/3; patient presents for 1 week follow-up. She has been using silver alginate with gentamicin cream to the wound bed. She reports completing all antibiotics. She has no issues or complaints today. She denies signs of infection. 8/17; patient presents for 2-week follow-up. He is to use silver alginate to the wound bed. She has no issues or complaints today. She denies signs of infection. She reports her pain has improved in her foot since last clinic visit 8/24; patient presents for 1 week follow-up. She continues to use silver alginate to the wound bed. She has no issues or complaints. She denies signs of infection. Pain is stable. 9/7; patient presents for follow-up. She missed her last week appointment due to feeling ill. She continues to use silver alginate. She has a new wound to the right lower extremity that is covered in eschar. She states It occurred over the past week and has no idea how it started. She currently denies signs of infection. 9/14; patient presents for follow-up. To the left foot wound she has been using gentamicin cream and silver alginate. To the right lower extremity wound she has been keeping this covered and has not obtain Santyl. Patient History Information obtained from Patient. Social History Current every day smoker, Marital Status - Single, Alcohol Use - Never, Drug Use - Prior History, Caffeine Use - Daily. Medical History Ear/Nose/Mouth/Throat Patient has history of Chronic sinus problems/congestion, Middle ear problems Hematologic/Lymphatic Patient has history of  Anemia Respiratory Patient has history of Chronic Obstructive Pulmonary Disease (COPD) Cardiovascular Patient has history of Congestive Heart Failure - EF 33% Endocrine Patient has history of Type II Diabetes Genitourinary [name], [name] ([id number]) Patient has history of End Stage Renal Disease Integumentary (Skin) Patient has history of History of pressure wounds Neurologic Patient has history of Neuropathy - feel and legs Objective Constitutional Vitals Time Taken: 11:21 AM, Height: 69 in, Weight: 185 lbs, BMI: 27.3, Temperature: 99.4 F, Pulse: 106 bpm, Respiratory Rate: 18 breaths/min, Blood Pressure: 106/75 mmHg. General Notes: Left foot (Transmetatarsal amputation): Wound to the plantar aspect that that has granulation tissue and nonviable tissue present. There is significant callus circumferentially. There is increased redness and warmth to part of the periwound. No purulent drainage. Right lower extremity: To the anterior aspect there is an eschar present. Post debridement there is still significant nonviable debris tightly adhered. Integumentary (Hair, Skin) Wound #7 status is Open. Original cause of wound was Gradually Appeared. The date acquired was: 08/07/2020. The wound has been in treatment 15 weeks. The wound is located on the [place]. The wound measures 2.5cm length x 3.5cm width x 1.4cm depth; 6.872cm^2 area and 9.621cm^3 volume. There is Fat Layer (Subcutaneous Tissue) exposed. There is no tunneling or undermining noted. There is a medium amount of serosanguineous drainage noted. The  wound margin is thickened. There is medium (34-66%) red, pink, hyper - granulation within the wound bed. There is a medium (34-66%) amount of necrotic tissue within the wound bed including Adherent Slough. Wound #8 status is Open. Original cause of wound was Gradually Appeared. The date acquired was: 06/30/2021. The wound has been in treatment 1 weeks. The wound is  located on the Right,Anterior Lower Leg. The wound measures 4.2cm length x 2.8cm width x 0.1cm depth; 9.236cm^2 area and 0.924cm^3 volume. There is Fat Layer (Subcutaneous Tissue) exposed. There is no tunneling or undermining noted. There is a medium amount of serosanguineous drainage noted. There is small (1-33%) pink granulation within the wound bed. There is a large (67-100%) amount of necrotic tissue within the wound bed including Eschar and Adherent Slough. Assessment Active Problems ICD-10 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ity Unspecified open wound, right lower leg, subsequent encounter Type 2 diabetes mellitus with foot ulcer Type 2 diabetes mellitus with diabetic polyneuropathy Patient's left foot wound has slight erythema and increased warmth to part of the periwound concerning for infection. I debrided nonviable tissue. I recommended a course of antibiotics. Also recommended continuing gentamicin ointment and silver alginate to this wound bed. To the right lower extremity there is still an eschar and slough present. I debrided nonviable tissue and recommended picking up Santyl from the pharmacy. If she is unable to obtain this for the next visit she would likely benefit from [place], [facility] under compression wrap if she is not able to dress this daily. Procedures Wound #7 Pre-procedure diagnosis of Wound #7 is a Pressure Ulcer located on the Left,Midline,Plantar Foot . There was a Excisional Skin/Subcutaneous Tissue Debridement with a total area of 8.75 sq cm performed by Kalman Shan, MD. With the following instrument(s): Blade, Curette, Forceps, and Scissors to remove Viable and Non-Viable tissue/material. Material removed includes Callus, Subcutaneous Tissue, and Slough. A time out was conducted at 11:42, prior to the start of the procedure. A Minimum amount of bleeding was controlled with Pressure. The procedure was  tolerated well. Post Debridement Measurements: 2.5cm length x 3.5cm width x 1.4cm depth; 9.621cm^3 volume. Post debridement Stage noted as Category/Stage II. Character of Wound/Ulcer Post Debridement is stable. FEIGE, KRUTZ A. ([id number]) Post procedure Diagnosis Wound #7: Same as Pre-Procedure Wound #8 Pre-procedure diagnosis of Wound #8 is a Diabetic Wound/Ulcer of the Lower Extremity located on the Right,Anterior Lower Leg .Severity of Tissue Pre Debridement is: Fat layer exposed. There was a Excisional Skin/Subcutaneous Tissue Debridement with a total area of 11.76 sq cm performed by Kalman Shan, MD. With the following instrument(s): Blade, Curette, Forceps, and Scissors to remove Viable and Non-Viable tissue/material. Material removed includes Eschar, Subcutaneous Tissue, and Slough. A time out was conducted at 11:42, prior to the start of the procedure. A Moderate amount of bleeding was controlled with Silver Nitrate. The procedure was tolerated well. Post Debridement Measurements: 4.2cm length x 2.8cm width x 0.2cm depth; 1.847cm^3 volume. Character of Wound/Ulcer Post Debridement is stable. Severity of Tissue Post Debridement is: Fat layer exposed. Post procedure Diagnosis Wound #8: Same as Pre-Procedure General Notes: 1 silver nitrate stick used. Plan Follow-up Appointments: Return Appointment in 1 week. Bathing/ Shower/ Hygiene: Clean wound with Normal Saline or wound cleanser. Off-Loading: Open toe surgical shoe with peg assist. Turn and reposition every 2 hours The following medication(s) was prescribed: clindamycin HCl oral 300 mg capsule 1 1 capsule oral QID for 10 days starting 07/07/2021 WOUND #  7: - Foot Wound Laterality: Plantar, Left, Midline Cleanser: Soap and Water 3 x Per Week/30 Days Discharge Instructions: Gently cleanse wound with antibacterial soap, rinse and pat dry prior to dressing wounds Peri-Wound Care: Desitin Maximum Strength Ointment 4 (oz) 3 x Per  Week/30 Days Discharge Instructions: Peri wound Topical: Gentamicin 3 x Per Week/30 Days Discharge Instructions: Apply to wound bed and tunnel area under the dressing Primary Dressing: Silvercel Small 2x2 (in/in) 3 x Per Week/30 Days Discharge Instructions: Apply to wound bed Secondary Dressing: ABD Pad 5x9 (in/in) 3 x Per Week/30 Days Discharge Instructions: Cover with ABD pad on top of dressing Secondary Dressing: Gauze 3 x Per Week/30 Days Discharge Instructions: To hold dressing in place Secured With: Kerlix Roll Sterile or Non-Sterile 6-ply 4.5x4 (yd/yd) 3 x Per Week/30 Days Discharge Instructions: Apply Kerlix as directed WOUND #8: - Lower Leg Wound Laterality: Right, Anterior Cleanser: Normal Saline 1 x Per Day/30 Days Discharge Instructions: Wash your hands with soap and water. Remove old dressing, discard into plastic bag and place into trash. Cleanse the wound with Normal Saline prior to applying a clean dressing using gauze sponges, not tissues or cotton balls. Do not scrub or use excessive force. Pat dry using gauze sponges, not tissue or cotton balls. Topical: Santyl Collagenase Ointment, 30 (gm), tube 1 x Per Day/30 Days Discharge Instructions: apply nickel thick to wound bed only Secondary Dressing: Wright Dressing, 4x4 (in/in) 1 x Per Day/30 Days Discharge Instructions: Apply over dressing to secure in place. 1. In office sharp debridement 2. Gentamicin ointment, silver alginate to the left plantar foot wound 3. Santyl to the right lower extremity wound 4. Follow-up in 1 week 5. Clindamycin Electronic Signature(s) Signed: 07/07/2021 12:11:09 PM By: Kalman Shan DO Entered By: Kalman Shan on 07/07/2021 12:10:53 [name] ([id number]) -------------------------------------------------------------------------------- ROS/PFSH Details Patient Name: Heather Dear A. Date of Service: 07/07/2021 10:45 AM Medical Record Number: [id number] Patient  Account Number: [id number] Date of Birth/Sex: 09/25/1976 (45 y.o. F) Treating RN: Dolan Amen Primary Care Provider: Raelene Bott Other Clinician: Referring Provider: Raelene Bott Treating Provider/Extender: Yaakov Guthrie in Treatment: 15 Information Obtained From Patient Ear/Nose/Mouth/Throat Medical History: Positive for: Chronic sinus problems/congestion; Middle ear problems Hematologic/Lymphatic Medical History: Positive for: Anemia Respiratory Medical History: Positive for: Chronic Obstructive Pulmonary Disease (COPD) Cardiovascular Medical History: Positive for: Congestive Heart Failure - EF 33% Endocrine Medical History: Positive for: Type II Diabetes Time with diabetes: 14 years Treated with: Insulin, Oral agents Blood sugar tested every day: No Blood sugar testing results: Bedtime: 176 Genitourinary Medical History: Positive for: End Stage Renal Disease Integumentary (Skin) Medical History: Positive for: History of pressure wounds Neurologic Medical History: Positive for: Neuropathy - feel and legs HBO Extended History Items Ear/Nose/Mouth/Throat: Ear/Nose/Mouth/Throat: Chronic sinus problems/congestion Middle ear problems Immunizations Pneumococcal Vaccine: Received Pneumococcal Vaccination: No Wiegert, Reeda A. ([id number]) Implantable Devices No devices added Family and Social History Current every day smoker; Marital Status - Single; Alcohol Use: Never; Drug Use: Prior History; Caffeine Use: Daily Electronic Signature(s) Signed: 07/07/2021 12:11:09 PM By: Kalman Shan DO Signed: 07/07/2021 4:36:53 PM By: Dolan Amen RN Entered By: Kalman Shan on 07/07/2021 12:03:21 [name] ([id number]) -------------------------------------------------------------------------------- [name] Details Patient Name: Heather Dear A. Date of Service: 07/07/2021 Medical Record Number: [id number] Patient Account Number: [id number] Date  of Birth/Sex: 1976-07-07 (45 y.o. F) Treating RN: Dolan Amen Primary Care Provider: Raelene Bott Other Clinician: Referring Provider: Raelene Bott Treating Provider/Extender: Yaakov Guthrie in Treatment: 15 Diagnosis Coding  ICD-10 Codes Code Description 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ity S81.801D Unspecified open wound, right lower leg, subsequent encounter E11.621 Type 2 diabetes mellitus with foot ulcer E11.42 Type 2 diabetes mellitus with diabetic polyneuropathy Facility Procedures CPT4 Code: [id number] Description: [id number] - DEB SUBQ TISSUE 20 SQ CM/< Modifier: Quantity: 1 CPT4 Code: Description: ICD-10 Diagnosis Description S81.801D Unspecified open wound, right lower leg, subsequent encounter Modifier: Quantity: CPT4 Code: [id number] Description: [id number] - DEB SUBQ TISS EA ADDL 20CM Modifier: Quantity: 1 CPT4 Code: Description: ICD-10 Diagnosis Description E11.621 Type 2 diabetes mellitus with foot ulcer Modifier: Quantity: Physician Procedures CPT4 [id number] Description: [id number] - WC PHYS LEVEL 4 - EST PT Modifier: Quantity: 1 CPT4 Code: Description: ICD-10 Diagnosis Description 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 S81.801D Unspecified open wound, right lower leg, subsequent encounter E11.621 Type 2 diabetes mellitus with foot ulcer E11.42  Type 2 diabetes mellitus with diabetic polyneuropathy Modifier: severity Quantity: CPT4 Code: [id number] Description: 11042 - WC PHYS SUBQ TISS 20 SQ CM Modifier: Quantity: 1 CPT4 Code: Description: ICD-10 Diagnosis Description S81.801D Unspecified open wound, right lower leg, subsequent encounter Modifier: Quantity: CPT4 Code: [id number] Description: [id number] - WC PHYS SUBQ TISS EA ADDL 20 CM Modifier: Quantity: 1 CPT4 Code: Description: ICD-10 Diagnosis Description E11.621 Type 2 diabetes mellitus with foot  ulcer Modifier: Quantity: Electronic Signature(s) Signed: 07/07/2021 12:11:09 PM By: Kalman Shan DO Entered By: Kalman Shan on 07/07/2021 12:07:23

## 2021-07-14 ENCOUNTER — Other Ambulatory Visit: Payer: Self-pay

## 2021-07-14 ENCOUNTER — Encounter (HOSPITAL_BASED_OUTPATIENT_CLINIC_OR_DEPARTMENT_OTHER): Payer: Medicaid Other | Admitting: Internal Medicine

## 2021-07-14 DIAGNOSIS — S81801D Unspecified open wound, right lower leg, subsequent encounter: Secondary | ICD-10-CM

## 2021-07-14 DIAGNOSIS — E1142 Type 2 diabetes mellitus with diabetic polyneuropathy: Secondary | ICD-10-CM

## 2021-07-14 DIAGNOSIS — E11621 Type 2 diabetes mellitus with foot ulcer: Secondary | ICD-10-CM

## 2021-07-14 DIAGNOSIS — L97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ity: Secondary | ICD-10-CM | POA: Diagnosis not present

## 2021-07-14 NOTE — Progress Notes (Addendum)
[name] ([id number]) Visit Report for 07/14/2021 Arrival Information Details Patient Name: [name], [name] Date of Service: 07/14/2021 10:45 AM Medical Record Number: [id number] Patient Account Number: [id number] Date of Birth/Sex: Dec 30, 1975 (45 y.o. F) Treating RN: Dolan Amen Primary Care Marquetta Weiskopf: Raelene Bott Other Clinician: Referring Johnae Friley: Raelene Bott Treating Josely Moffat/Extender: Yaakov Guthrie in Treatment: 16 Visit Information History Since Last Visit Has Dressing in Place as Prescribed: No Patient Arrived: Wheel Chair Pain Present Now: Yes Arrival Time: 11:09 Accompanied By: mother Transfer Assistance: None Patient Identification Verified: Yes Secondary Verification Process Completed: Yes Patient Requires Transmission-Based No Precautions: Patient Has Alerts: Yes Patient Alerts: Patient on Blood Thinner 76m apirin Diabetic Type II Electronic Signature(s) Signed: 07/14/2021 4:25:23 PM By: SDolan AmenRN Entered By: SDolan Amenon 07/14/2021 11:13:27 SLonzo Candy([id number] -------------------------------------------------------------------------------- Clinic Level of Care Assessment Details Patient Name: Heather DearA. Date of Service: 07/14/2021 10:45 AM Medical Record Number: [id number] Account Number: [id number] of Birth/Sex: [birth year](45y.o. F) Treating RN: SDolan AmenPrimary Care Cristel Rail: HRaelene BottOther Clinician: Referring Ashmi Blas: HRaelene BottTreating Tarahji Ramthun/Extender: HYaakov Guthriein Treatment: 16 Clinic Level of Care Assessment Items TOOL 1 Quantity Score []  - Use when EandM and Procedure is performed on INITIAL visit 0 ASSESSMENTS - Nursing Assessment / Reassessment []  - General Physical Exam (combine w/ comprehensive assessment (listed just below) when performed on new 0 pt. evals) []  - 0 Comprehensive Assessment (HX, ROS, Risk Assessments, Wounds Hx,  etc.) ASSESSMENTS - Wound and Skin Assessment / Reassessment []  - Dermatologic / Skin Assessment (not related to wound area) 0 ASSESSMENTS - Ostomy and/or Continence Assessment and Care []  - Incontinence Assessment and Management 0 []  - 0 Ostomy Care Assessment and Management (repouching, etc.) PROCESS - Coordination of Care []  - Simple Patient / Family Education for ongoing care 0 []  - 0 Complex (extensive) Patient / Family Education for ongoing care []  - 0 Staff obtains CProgrammer, systems Records, Test Results / Process Orders []  - 0 Staff telephones HHA, Nursing Homes / Clarify orders / etc []  - 0 Routine Transfer to another Facility (non-emergent condition) []  - 0 Routine Hospital Admission (non-emergent condition) []  - 0 New Admissions / IBiomedical engineer/ Ordering NPWT, Apligraf, etc. []  - 0 Emergency Hospital Admission (emergent condition) PROCESS - Special Needs []  - Pediatric / Minor Patient Management 0 []  - 0 Isolation Patient Management []  - 0 Hearing / Language / Visual special needs []  - 0 Assessment of Community assistance (transportation, D/C planning, etc.) []  - 0 Additional assistance / Altered mentation []  - 0 Support Surface(s) Assessment (bed, cushion, seat, etc.) INTERVENTIONS - Miscellaneous []  - External ear exam 0 []  - 0 Patient Transfer (multiple staff / HCivil Service fast streamer/ Similar devices) []  - 0 Simple Staple / Suture removal (25 or less) []  - 0 Complex Staple / Suture removal (26 or more) []  - 0 Hypo/Hyperglycemic Management (do not check if billed separately) []  - 0 Ankle / Brachial Index (ABI) - do not check if billed separately Has the patient been seen at the hospital within the last three years: Yes Total Score: 0 Level Of Care: ____ SLonzo Candy([id number] Electronic Signature(s) Signed: 07/14/2021 4:25:23 PM By: SDolan AmenRN Entered By: SDolan Amenon 07/14/2021 11:59:59 Mondor, Kairee A.  ([id number] -------------------------------------------------------------------------------- Lower Extremity Assessment Details Patient Name: Heather DearA. Date of Service: 07/14/2021 10:45 AM Medical Record Number: [id number] Account Number: [id number] of Birth/Sex: 910-Jul-1977( 45 y.o. F) Treating RN: Dolan Amen Primary Care Shania Bjelland: Raelene Bott Other Clinician: Referring Roshawn Lacina: Raelene Bott Treating Horris Speros/Extender: Yaakov Guthrie in Treatment: 16 Edema Assessment Assessed: [name]: Yes] [name]: Yes] [Left: Edema] [Right: :] Vascular Assessment Pulses: Dorsalis Pedis Palpable: [Left:Yes] [Right:Yes] Doppler Audible: [Right:Yes] Posterior Tibial Palpable: [Right:Yes] Doppler Audible: [Right:Yes] Blood Pressure: Brachial: [ZOXWR:604] Dorsalis Pedis: 124 Ankle: Posterior Tibial: 128 Ankle Brachial Index: [Right:1.09] Electronic Signature(s) Signed: 07/14/2021 4:25:23 PM By: Dolan Amen RN Entered By: Dolan Amen on 07/14/2021 11:58:08 Foulk, Ryliee A. ([id number]) -------------------------------------------------------------------------------- Multi Wound Chart Details Patient Name: Heather Dear A. Date of Service: 07/14/2021 10:45 AM Medical Record Number: [id number] Patient Account Number: [id number] Date of Birth/Sex: 08-05-1976 (45 y.o. F) Treating RN: Dolan Amen Primary Care Celisa Schoenberg: Raelene Bott Other Clinician: Referring Sherian Valenza: Raelene Bott Treating Lakisa Lotz/Extender: Yaakov Guthrie in Treatment: 16 Vital Signs Height(in): 9 Pulse(bpm): 102 Weight(lbs): 185 Blood Pressure(mmHg): 117/83 Body Mass Index(BMI): 27 Temperature(F): 98.6 Respiratory Rate(breaths/min): 18 Photos: [N/A:N/A] Wound Location: Left, Midline, Plantar Foot Right, Anterior Lower Leg N/A Wounding Event: Gradually Appeared Gradually Appeared N/A Primary Etiology: Pressure Ulcer Diabetic Wound/Ulcer of the Lower  N/A Extremity Comorbid History: Chronic sinus problems/congestion, Chronic sinus problems/congestion, N/A Middle ear problems, Anemia, Middle ear problems, Anemia, Chronic Obstructive Pulmonary Chronic Obstructive Pulmonary Disease (COPD), Congestive Heart Disease (COPD), Congestive Heart Failure, Type II Diabetes, End Stage Failure, Type II Diabetes, End Stage Renal Disease, History of pressure Renal Disease, History of pressure wounds, Neuropathy wounds, Neuropathy Date Acquired: 08/07/2020 06/30/2021 N/A Weeks of Treatment: 16 2 N/A Wound Status: Open Open N/A Measurements L x W x D (cm) 2.2x2.5x0.3 4x2.8x0.2 N/A Area (cm) : 4.32 8.796 N/A Volume (cm) : 1.296 1.759 N/A % Reduction in Area: -61.80% -40.00% N/A % Reduction in Volume: 2.90% -180.10% N/A Classification: Category/Stage II Grade 1 N/A Exudate Amount: Medium Medium N/A Exudate Type: Serosanguineous Serosanguineous N/A Exudate Color: red, brown red, brown N/A Wound Margin: Thickened N/A N/A Granulation Amount: Large (67-100%) Small (1-33%) N/A Granulation Quality: Red, Pink, Hyper-granulation Red N/A Necrotic Amount: Small (1-33%) Large (67-100%) N/A Exposed Structures: Fat Layer (Subcutaneous Tissue): Fat Layer (Subcutaneous Tissue): N/A Yes Yes Fascia: No Fascia: No Tendon: No Tendon: No Muscle: No Muscle: No Joint: No Joint: No Bone: No Bone: No Epithelialization: None None N/A Treatment Notes Electronic Signature(s) Signed: 07/14/2021 4:25:23 PM By: Dolan Amen RN [name], [place] ([id number]) Entered By: Dolan Amen on 07/14/2021 11:41:57 [name] ([id number]) -------------------------------------------------------------------------------- [place] Details Patient Name: Heather Dear A. Date of Service: 07/14/2021 10:45 AM Medical Record Number: [id number] Patient Account Number: [id number] Date of Birth/Sex: October 01, 1976 (45 y.o. F) Treating RN: Dolan Amen Primary Care Randale Carvalho: Raelene Bott Other Clinician: Referring Leavy Heatherly: Raelene Bott Treating Rocio Roam/Extender: Yaakov Guthrie in Treatment: 16 Active Inactive Wound/Skin Impairment Nursing Diagnoses: Knowledge deficit related to ulceration/compromised skin integrity Goals: Patient/caregiver will verbalize understanding of skin care regimen Date Initiated: 03/19/2021 Date Inactivated: 04/28/2021 Target Resolution Date: 04/19/2021 Goal Status: Met Ulcer/skin breakdown will have a volume reduction of 30% by week 4 Date Initiated: 03/19/2021 Date Inactivated: 04/28/2021 Target Resolution Date: 04/19/2021 Goal Status: Unmet Unmet Reason: con't tx Ulcer/skin breakdown will have a volume reduction of 50% by week 8 Date Initiated: 03/19/2021 Date Inactivated: 06/16/2021 Target Resolution Date: 05/19/2021 Goal Status: Unmet Unmet Reason: comorbidities Ulcer/skin breakdown will have a volume reduction of 80% by week 12 Date Initiated: 03/19/2021 Date Inactivated: 06/16/2021 Target Resolution Date: 06/19/2021 Goal Status: Unmet Unmet Reason: comorbidities Ulcer/skin breakdown  will heal within 14 weeks Date Initiated: 03/19/2021 Target Resolution Date: 07/20/2021 Goal Status: Active Interventions: Assess patient/caregiver ability to obtain necessary supplies Assess patient/caregiver ability to perform ulcer/skin care regimen upon admission and as needed Assess ulceration(s) every visit Notes: Electronic Signature(s) Signed: 07/14/2021 4:25:23 PM By: Dolan Amen RN Entered By: Dolan Amen on 07/14/2021 11:41:37 Yim, Arminda A. ([id number]) -------------------------------------------------------------------------------- Pain Assessment Details Patient Name: Heather Dear A. Date of Service: 07/14/2021 10:45 AM Medical Record Number: [id number] Patient Account Number: [id number] Date of Birth/Sex: 1976/06/26 (45 y.o. F) Treating RN: Dolan Amen Primary Care  Deshondra Worst: Raelene Bott Other Clinician: Referring Lesha Jager: Raelene Bott Treating Deshanna Kama/Extender: Yaakov Guthrie in Treatment: 16 Active Problems Location of Pain Severity and Description of Pain Patient Has Paino Yes Site Locations Pain Location: Pain in Ulcers Rate the pain. Current Pain Level: 8 Pain Management and Medication Current Pain Management: Electronic Signature(s) Signed: 07/14/2021 4:25:23 PM By: Dolan Amen RN Entered By: Dolan Amen on 07/14/2021 11:14:07 [name] ([id number]) -------------------------------------------------------------------------------- Patient/Caregiver Education Details Patient Name: Heather Dear A. Date of Service: 07/14/2021 10:45 AM Medical Record Number: [id number] Patient Account Number: [id number] Date of Birth/Gender: Sep 04, 1976 (45 y.o. F) Treating RN: Dolan Amen Primary Care Physician: Raelene Bott Other Clinician: Referring Physician: Raelene Bott Treating Physician/Extender: Yaakov Guthrie in Treatment: 16 Education Assessment Education Provided To: Patient Education Topics Provided Wound/Skin Impairment: Methods: Explain/Verbal Responses: State content correctly Electronic Signature(s) Signed: 07/14/2021 4:25:23 PM By: Dolan Amen RN Entered By: Dolan Amen on 07/14/2021 12:05:06 [name] ([id number]) -------------------------------------------------------------------------------- Wound Assessment Details Patient Name: Heather Dear A. Date of Service: 07/14/2021 10:45 AM Medical Record Number: [id number] Patient Account Number: [id number] Date of Birth/Sex: August 25, 1976 (45 y.o. F) Treating RN: Dolan Amen Primary Care Shavana Calder: Raelene Bott Other Clinician: Referring Arliss Hepburn: Raelene Bott Treating Pattrick Bady/Extender: Yaakov Guthrie in Treatment: 16 Wound Status Wound Number: 7 Primary Pressure Ulcer Etiology: Wound Location: Left, Midline,  Plantar Foot Wound Open Wounding Event: Gradually Appeared Status: Date Acquired: 08/07/2020 Comorbid Chronic sinus problems/congestion, Middle ear problems, Weeks Of Treatment: 16 History: Anemia, Chronic Obstructive Pulmonary Disease (COPD), Clustered Wound: No Congestive Heart Failure, Type II Diabetes, End Stage Renal Disease, History of pressure wounds, Neuropathy Photos Wound Measurements Length: (cm) 2.2 Width: (cm) 2.5 Depth: (cm) 0.3 Area: (cm) 4.32 Volume: (cm) 1.296 % Reduction in Area: -61.8% % Reduction in Volume: 2.9% Epithelialization: None Tunneling: No Undermining: No Wound Description Classification: Category/Stage II Wound Margin: Thickened Exudate Amount: Medium Exudate Type: Serosanguineous Exudate Color: red, brown Foul Odor After Cleansing: No Slough/Fibrino Yes Wound Bed Granulation Amount: Large (67-100%) Exposed Structure Granulation Quality: Red, Pink, Hyper-granulation Fascia Exposed: No Necrotic Amount: Small (1-33%) Fat Layer (Subcutaneous Tissue) Exposed: Yes Necrotic Quality: Adherent Slough Tendon Exposed: No Muscle Exposed: No Joint Exposed: No Bone Exposed: No Electronic Signature(s) Signed: 07/14/2021 4:25:23 PM By: Dolan Amen RN Entered By: Dolan Amen on 07/14/2021 11:19:13 [place], [place] ([id number]) -------------------------------------------------------------------------------- Wound Assessment Details Patient Name: Heather Dear A. Date of Service: 07/14/2021 10:45 AM Medical Record Number: [id number] Patient Account Number: [id number] Date of Birth/Sex: 10/22/1976 (45 y.o. F) Treating RN: Dolan Amen Primary Care Ardel Jagger: Raelene Bott Other Clinician: Referring Steve Youngberg: Raelene Bott Treating Jenipher Havel/Extender: Yaakov Guthrie in Treatment: 16 Wound Status Wound Number: 8 Primary Diabetic Wound/Ulcer of the Lower Extremity Etiology: Wound Location: Right, Anterior Lower Leg Wound  Open Wounding Event: Gradually Appeared Status: Date Acquired: 06/30/2021 Comorbid Chronic sinus problems/congestion, Middle ear problems, Weeks Of  Treatment: 2 History: Anemia, Chronic Obstructive Pulmonary Disease (COPD), Clustered Wound: No Congestive Heart Failure, Type II Diabetes, End Stage Renal Disease, History of pressure wounds, Neuropathy Photos Wound Measurements Length: (cm) 4 Width: (cm) 2.8 Depth: (cm) 0.2 Area: (cm) 8.796 Volume: (cm) 1.759 % Reduction in Area: -40% % Reduction in Volume: -180.1% Epithelialization: None Tunneling: No Undermining: No Wound Description Classification: Grade 1 Exudate Amount: Medium Exudate Type: Serosanguineous Exudate Color: red, brown Foul Odor After Cleansing: No Slough/Fibrino No Wound Bed Granulation Amount: Small (1-33%) Exposed Structure Granulation Quality: Red Fascia Exposed: No Necrotic Amount: Large (67-100%) Fat Layer (Subcutaneous Tissue) Exposed: Yes Necrotic Quality: Adherent Slough Tendon Exposed: No Muscle Exposed: No Joint Exposed: No Bone Exposed: No Electronic Signature(s) Signed: 07/14/2021 4:25:23 PM By: Dolan Amen RN Entered By: Dolan Amen on 07/14/2021 11:19:54 [name] ([id number]) -------------------------------------------------------------------------------- [place] Details Patient Name: Heather Dear A. Date of Service: 07/14/2021 10:45 AM Medical Record Number: [id number] Patient Account Number: [id number] Date of Birth/Sex: 06-29-1976 (45 y.o. F) Treating RN: Dolan Amen Primary Care Haidy Kackley: Raelene Bott Other Clinician: Referring [place]: Raelene Bott Treating Hardie Veltre/Extender: Yaakov Guthrie in Treatment: 16 Vital Signs Time Taken: 11:13 Temperature (F): 98.6 Height (in): 69 Pulse (bpm): 102 Weight (lbs): 185 Respiratory Rate (breaths/min): 18 Body Mass Index (BMI): 27.3 Blood Pressure (mmHg): 117/83 Reference Range: 80 - 120 mg /  dl Electronic Signature(s) Signed: 07/14/2021 4:25:23 PM By: Dolan Amen RN Entered By: Dolan Amen on 07/14/2021 11:13:48

## 2021-07-15 NOTE — Progress Notes (Signed)
[name] ([id number]) Visit Report for 07/14/2021 Chief Complaint Document Details Patient Name: [name], [name] Date of Service: 07/14/2021 10:45 AM Medical Record Number: [id number] Patient Account Number: [id number] Date of Birth/Sex: Dec 21, 1975 (45 y.o. F) Treating RN: Dolan Amen Primary Care Provider: Raelene Bott Other Clinician: Referring Provider: Raelene Bott Treating Provider/Extender: Yaakov Guthrie in Treatment: 16 Information Obtained from: Patient Chief Complaint 03/19/2021; patient referred by Dr. Luana Shu who has been looking after her left foot for quite a period of time for review of a nonhealing area in the left midfoot Electronic Signature(s) Signed: 07/15/2021 9:23:39 AM By: Kalman Shan DO Entered By: Kalman Shan on 07/15/2021 09:18:58 [name] ([id number]) -------------------------------------------------------------------------------- Debridement Details Patient Name: Heather Dear A. Date of Service: 07/14/2021 10:45 AM Medical Record Number: [id number] Patient Account Number: [id number] Date of Birth/Sex: 10/20/1976 (45 y.o. F) Treating RN: Dolan Amen Primary Care Provider: Raelene Bott Other Clinician: Referring Provider: Raelene Bott Treating Provider/Extender: Yaakov Guthrie in Treatment: 16 Debridement Performed for Wound #8 Right,Anterior Lower Leg Assessment: Performed By: Physician Kalman Shan, MD Debridement Type: Debridement Severity of Tissue Pre Debridement: Fat layer exposed Level of Consciousness (Pre- Awake and Alert procedure): Pre-procedure Verification/Time Out Yes - 11:42 Taken: Start Time: 11:42 Total Area Debrided (L x W): 4 (cm) x 2.8 (cm) = 11.2 (cm) Tissue and other material Non-Viable, Slough, Slough debrided: Level: Non-Viable Tissue Debridement Description: Selective/Open Wound Instrument: Curette Bleeding: Minimum Hemostasis Achieved: Pressure Response to  Treatment: Procedure was tolerated well Level of Consciousness (Post- Awake and Alert procedure): Post Debridement Measurements of Total Wound Length: (cm) 4 Width: (cm) 2.8 Depth: (cm) 0.2 Volume: (cm) 1.759 Character of Wound/Ulcer Post Debridement: Stable Severity of Tissue Post Debridement: Fat layer exposed Post Procedure Diagnosis Same as Pre-procedure Electronic Signature(s) Signed: 07/14/2021 4:25:23 PM By: Dolan Amen RN Signed: 07/15/2021 9:23:39 AM By: Kalman Shan DO Entered By: Dolan Amen on 07/14/2021 11:42:38 [name], [name] ([id number]) -------------------------------------------------------------------------------- HPI Details Patient Name: Heather Dear A. Date of Service: 07/14/2021 10:45 AM Medical Record Number: [id number] Patient Account Number: [id number] Date of Birth/Sex: 1975/11/07 (45 y.o. F) Treating RN: Dolan Amen Primary Care Provider: Raelene Bott Other Clinician: Referring Provider: Raelene Bott Treating Provider/Extender: Yaakov Guthrie in Treatment: 16 History of Present Illness HPI Description: 01/18/18-She is here for initial evaluation of the left great toe ulcer. She is a poor historian in regards to timeframe in detail. She states approximately 4 weeks ago she lacerated her toe on something in the house. She followed up with her primary care who placed her on Bactrim and ultimately a second dose of Bactrim prior to coming to wound clinic. She states she has been treating the toe with peroxide, Betadine and a Band-Aid. She did not check her blood sugar this morning but checked it yesterday morning it was 327; she is unaware of a recent A1c and there are no current records. She saw Dr. she would've orthopedics last week for an old injury to the left ankle, she states he did not see her toe, nor did she bring it to his attention. She smokes approximately 1 pack cigarettes a day. Her social situation is concerning, she  arrives this morning with her mother who appears extremely intoxicated/under the influence; her mother was asked to leave the room and be monitored by the patient's grandmother. The patient's aunt then accompanied the patient and the room throughout the rest of the appointment. We had a lengthy discussion regarding the deleterious effects of  uncontrolled hyperglycemia and smoking as it relates to wound healing and overall health. She was strongly encouraged to decrease her smoking and get her diabetes under better control. She states she is currently on a diet and has cut down her [facility] consumption. The left toe is erythematous, macerated and slightly edematous with malodor present. The edema in her left foot is below her baseline, there is no erythema streaking. We will treat her with Santyl, doxycycline; we have ordered and xray, culture and provided a Peg assist surgical shoe and cultured the wound. 01/25/18-She is here in follow-up evaluation for a left great toe ulcer and presents with an abscess to her suprapubic area. She states her blood sugars remain elevated, feeling "sick" and if levels are below 250, but she is trying. She has made no attempt to decrease her smoking stating that we "can't take away her food in her cigarettes". She has been compliant with offloading using the PEG assist you. She is using Santyl daily. the culture obtained last week grew staph aureus and Enterococcus faecalis; continues on the doxycycline and Augmentin was added on Monday. The suprapubic area has erythema, no femoral variation, purple discoloration, minimal induration, was accessed with a cotton tip applicator with sanguinopurulent drainage, this was cultured, I suspect the current antibiotic treatment will cover and we will not add anything to her current treatment plan. She was advised to go to urgent care or ER with any change in redness, induration or fever. 02/01/18-She is here in follow-up  evaluation for left great toe ulcers and a new abdominal abscess from last week. She was able to use packing until earlier this week, where she "forgot it was there". She states she was feeling ill with GI symptoms last week and was not taking her antibiotic. She states her glucose levels have been predominantly less than 200, with occasional levels between 200-250. She thinks this was contributing to her GI symptoms as they have resolved without intervention. There continues to be significant laceration to left toe, otherwise it clinically looks stable/improved. There is now less superficial opening to the lateral aspect of the great toe that was residual blister. We will transition to [facility] to all wounds, she will continue her Augmentin. If there is no change or deterioration next week for reculture. 02/08/18-She is here in follow-up evaluation for left great toe ulcer and abdominal ulcer. There is an improvement in both wounds. She has been wrapping her left toe with coban, not by our direction, which has created an area of discoloration to the medial aspect; she has been advised to NOT use coban secondary to her neuropathy. She states her glucose levels have been high over this last week ranging from 200-350, she continues to smoke. She admits to being less compliant with her offloading shoe. We will continue with same treatment plan and she will follow-up next week. 02/15/18-She is here in follow-up evaluation for left great toe ulcer and abdominal ulcer. The abdominal ulcer is epithelialized. The left great toe ulcer is improved and all injury from last week using the Coban wrap is resolved, the lateral ulcer is healed. She admits to noncompliance with wearing offloading shoe and admits to glucose levels being greater than 300 most of the week. She continues to smoke and expresses no desire to quit. There is one area medially that probes deeper than it has historically, erythema to the toe  and dorsal foot has consistently waxed and waned. There is no overt signs of cellulitis  or infection but we will culture the wound for any occult infection given the new area of depth and erythema. We will hold off on sensitivities for initiation of antibiotic therapy. 02/22/18-She is here in follow up evaluation for left great toe ulcer. There is overall significant improvement in both wound appearance, erythema and edema with changes made last week. She was not initiated on antibiotic therapy. Culture obtained last week showed oxacillin sensitive staph aureus, sensitive to clindamycin. Clindamycin has been called into the pharmacy but she has been instructed to hold off on initiation secondary to overall clinical improvement and her history of antibiotic intolerance. She has been instructed to contact the clinic with any noted changes/deterioration and the wound, erythema, edema and/or pain. She will follow-up next week. She continues to smoke and her glucose levels remain elevated >250; she admits to compliance with offloading shoe 03/01/18 on evaluation today patient appears to be doing fairly well in regard to her left first toe ulcer. She has been tolerating the dressing changes with the [facility] Dressing without complication and overall this has definitely showed signs of improvement according to records as well is what the patient tells me today. I'm very pleased in that regard. She is having no pain today 03/08/18 She is here for follow up evaluation of a left great toe ulcer. She remains non-compliant with glucose control and smoking cessation; glucose levels consistently >200. She states that she got new shoe inserts/peg assist. She admits to compliance with offloading. Since my last evaluation there is significant improvement. We will switch to prisma at this time and she will follow up next week. She is noted to be tachycardic at this appointment, heart rate 120s; she has a history of  heart rate 70-130 according to our records. She admits to extreme agitation r/t personal issues; she was advised to monitor her heartrate and contact her physician if it does not return to a more normal range (<100). She takes cardizem twice daily. 03/15/18-She is here in follow-up evaluation for left great toe ulcer. She remains noncompliant with glucose control and smoking cessation. She admits to compliance with wearing offloading shoe. The ulcer is improved/stable and we will continue with the same treatment plan and she will follow-up next week 03/22/18-She is here for evaluation for left great toe ulcer. There continues to be significant improvement despite recurrent hyperglycemia (over 500 yesterday) and she continues to smoke. She has been compliant with offloading and we will continue with same treatment plan and she will follow-up next week. 03/29/18-She is here for evaluation for left great toe ulcer. Despite continuing to smoke and uncontrolled diabetes she continues to improve. She is compliant with offloading shoe. We will continue with the same treatment plan and she will follow-up next week 04/05/18- She is here in follow up evaluation for a left great toe ulcer; she presents with small pustule to left fifth toe (resembles ant bite). She admits to compliance with wearing offloading shoe; continues to smoke or have uncontrolled blood glucose control. There is more callus than usual with evidence of bleeding; she denies known trauma. 04/12/18-She is here for evaluation of left great toe ulcer. Despite noncompliance with glycemic control and smoking she continues to make [name], [name] ([id number]) improvement. She continues to wear offloading shoe. The pustule, that was identified last week, to the left fifth toe is resolved. She will follow-up in 2 weeks 05/03/18-she is seen in follow-up evaluation for a left great toe ulcer. She is compliant with  offloading, otherwise noncompliant with  glycemic control and smoking. She has plateaued and there is minimal improvement noted. We will transition to [facility], replaced the insert to her surgical shoe and she will follow-up in one week 05/10/18- She is here in follow up evaluation for a left great toe ulcer. It appears stable despite measurement change. We will continue with same treatment plan and follow up next week. 05/24/18-She is seen in follow-up evaluation for a left great toe ulcer. She remains compliant with offloading, has made significant improvement in her diet, decreasing the amount of sugar/soda. She said her recent A1c was 10.9 which is lower than. She did see a diabetic nutritionist/educator yesterday. She continues to smoke. We will continue with the same treatment plan and she'll follow-up next week. 05/31/18- She is seen in follow-up evaluation for left great toe ulcer. She continues to remain compliant with offloading, continues to make improvement in her diet, increasing her water and decreasing the amount of sugar/soda. She does continue to smoke with no desire to quit. We will apply Prisma to the depth and Hydrofera Blue over. We have not received insurance authorization for oasis. She will follow up next week. 06/07/18-She is seen in follow-up evaluation for left great toe ulcer. It has stalled according to today's measurements although base appears stable. She says she saw a diabetic educator yesterday; her average blood sugars are less than 300 which is an improvement for her. She continues to smoke and states "that's my next step" She continues with water over soda. We will order for xray, culture and reinstate ace wrap compression prior to placing apligraf for next week. She is voicing no complaints or concerns. Her dressing will change to iodoflex over the next week in preparation for apligraf. 06/14/18-She is seen in follow-up evaluation for left great toe ulcer. Plain film x-ray performed last week was  negative for osteomyelitis. Wound culture obtained last week grew strep B and OSSA; she is initiated on keflex and cefdinir today; there is erythema to the toe which could be from ace wrap compression, she has a history of wrapping too tight and has has been encouraged to maintain ace wraps that we place today. We will hold off on application of apligraf today, will apply next week after antibiotic therapy has been initiated. She admits today that she has resumed taking a shower with her foot/toe submerged in water, she has been reminded to keep foot/toe out of the bath water. She will be seen in follow up next week 06/21/18-she is seen in follow-up evaluation for left great toe ulcer. She is tolerating antibiotic therapy with no GI disturbance. The wound is stable. Apligraf was applied today. She has been decreasing her smoking, only had 4 cigarettes yesterday and 1 today. She continues being more compliant in diabetic diet. She will follow-up next week for evaluation of site, if stable will remove at 2 weeks. 06/28/18- She is here in follow up evalution. Apligraf was placed last week, she states the dressing fell off on Tuesday and she was dressing with hydrofera blue. She is healed and will be discharged from the clinic today. She has been instructed to continue with smoking cessation, continue monitoring glucose levels, offloading for an additional 4 weeks and continue with hydrofera blue for additional two weeks for any possible microscopic opening. Readmission: 08/07/18 on evaluation today patient presents for reevaluation concerning the ulcer of her right great toe. She was previously discharged on 06/28/18 healed. Nonetheless she states that this  began to show signs of drainage she subsequently went to her primary care provider. Subsequently an x-ray was performed on 08/01/18 which was negative. The patient was also placed on antibiotics at that time. Fortunately they should have been effective  for the infection. Nonetheless she's been experiencing some improvement but still has a lot of drainage coming from the wound itself. 08/14/18 on evaluation today patient's wound actually does show signs of improvement in regard to the erythema at this point. She has completed the antibiotics. With that being said we did discuss the possibility of placing her in a total contact cast as of today although I think that I may want to give this just a little bit more time to ensure nothing recurrence as far as her infection is concerned. I do not want to put in the cast and risk infection at that time if things are not completely resolved. With that being said she is gonna require some debridement today. 08/21/18 on evaluation today patient actually appears to be doing okay in regard to her toe ulcer. She's been tolerating the dressing changes without complication. With that being said it does appear that she is ready and in fact I think it's appropriate for [place] to go ahead and initiate the total contact cast today. Nonetheless she will require some sharp debridement to prepare the wound for application. Overall I feel like things have been progressing well but we do need to do something to get this to close more readily. 08/24/18 patient seen today for reevaluation after having had the total contact cast applied on Tuesday. She seems to have done very well the wound appears to be doing great and overall I'm pleased with the progress that she's made. There were no abnormal areas of rubbing from the cast on her lower extremity. 08/30/18 on evaluation today patient actually appears to be completely healed in regard to her plantar toe ulcer. She tells me at this point she's been having a lot of issues with the cast. She almost fell a couple of times the state shall the step of her dog a couple times as well. This is been a very frustrating process for her other nonetheless she has completely healed the wound  which is excellent news. Overall there does not appear to be the evidence of infection at this time which is great news. 09/11/18 evaluation today patient presents for follow-up concerning her great toe ulcer on the left which has unfortunately reopened since I last saw her which was only a couple of weeks ago. Unfortunately she was not able to get in to get the shoe and potentially the AFO that's gonna be necessary due to her left foot drop. She continues with offloading shoe but this is not enough to prevent her from reopening it appears. When we last had her in the total contact cast she did well from a healing standpoint but unfortunately the wound reopened as soon as she came out of the cast within just a couple of weeks. 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 This is also part of the reason why she falls on a regular basis. I just do not believe that is gonna be safe for [place] to reinitiate the total contact cast as last time we had  this on she fell 3 times one week which is definitely not normal for her. 09/18/18 upon evaluation today the patient actually appears to be doing about the same in regard to her toe ulcer. She did not contact [employer] as I asked her to even though I had given her the prescription. In fact she actually states that she has no idea where the prescription is. She did apparently call [employer] and they told her that all she needed to do was bring the prescription in order to be able to be seen and work on getting the AFO for her left foot. With all that being said she still does not have an appointment and I'm not sure were things stand that regard. I will give her a new prescription today in order to contact them to get this set up. 09/25/18 on evaluation today patient actually  appears to be doing about the same in regard to her toes ulcer. She does have a small areas which seems to have a lot of callous buildup around the edge of the wound which is going to need sharp debridement today. She still is waiting to be scheduled for evaluation with [employer] for possibility of an AFO. She states there supposed to call her tomorrow to get this set up. Unfortunately it does appear that her foot specifically the toe area is showing signs of erythema. There does not appear to be any systemic infection which is in these good news. ZOI, [name] ([id number]) 10/02/18 on evaluation today patient actually appears to be doing about the same in regard to her toe ulcer. This really has not done too well although it's not significantly larger it's also not significantly smaller. She has been tolerating the dressing changes without complication. She actually has her appointment with [employer] and [place] tomorrow to hopefully be measured for obtaining and AFO splint. I think this would be helpful preventing this from reoccurring. We had contemplated starting the cast this week although to be honest I am reluctant to do that as she's been having nausea, vomiting, and seizure activity over the past three days. She has a history of seizures and have been told is nothing that can be done for these. With that being said I do believe that along with the seizures have the nausea vomiting which upon further questioning doesn't seem to be the normal for her and makes me concerned for the possibility of infection or something else going on. I discussed this with the patient and her mother during the office visit today. I do not feel the wound is effective but maybe something else. The responses this was "this just happens to her at times and we don't know why". They did not seem to be interested in going to the hospital to have this checked out further. 10/09/18 on evaluation today patient presents  for follow-up concerning her ongoing toe ulcer. She has been tolerating the dressing changes without complication. Fortunately there does not appear to be any evidence of infection which is great news however I do think that the patient would benefit from going ahead for with the total contact cast. She's actually in a wheelchair today she tells me that she will use her walker if we initiate the cast. I was very specific about the fact that if we were gonna do the cast I wanted to make sure that she was using the walker in order to prevent any falls. She tells me she does not have stairs that she  has to traverse on a regular basis at her home. She has not had any seizures since last week again that something that happens to her often she tells me she did talk to [employer] and they said that it may take up to three weeks to get the brace approved for her. Hopefully that will not take that long but nonetheless in the meantime I do think the cast could be of benefit. 10/12/18 on evaluation today patient appears to be doing rather well in regard to her toe ulcer. It's just been a few days and already this is significantly improved both as far as overall appearance and size. Fortunately there's no sign of infection. She is here for her first obligatory cast change. 10/19/18 Seen today for follow up and management of left great toe ulcer. Wound continues to show improvement. Noted small open area with seroussang drainage with palpation. Denies any increased pain or recent fevers during visit. She will continue calcium alginate with offloading shoe. Denies any questions or concerns during visit. 10/26/18 on evaluation today patient appears to be doing about the same as when I last saw her in regard to her wound bed. Fortunately there does not appear to be any signs of infection. Unfortunately she continues to have a breakdown in regard to the toe region any time that she is not in the cast. It takes almost no  time at all for this to happen. Nonetheless she still has not heard anything from the brace being made by [employer] as to when exactly this will be available to her. Fortunately there is no signs of infection at this time. 10/30/18 on evaluation today patient presents for application of the total contact cast as we just received him this morning. Fortunately we are gonna be able to apply this to her today which is great news. She continues to have no significant pain which is good news. Overall I do feel like things have been improving while she was the cast is when she doesn't have a cast that things get worse. She still has not really heard anything from [place] regarding her brace. 11/02/18 upon evaluation today patient's wound already appears to be doing significantly better which is good news. Fortunately there does not appear to be any signs of infection also good news. Overall I do think the total contact cast as before is helping to heal this area unfortunately it's just not gonna likely keep the area closed and healed without her getting her brace at least. Again the foot drop is a significant issue for her. 11/09/18 on evaluation today patient appears to be doing excellent in regard to her toe ulcer which in fact is completely healed. Fortunately we finally got the situation squared away with the paperwork which was needed to proceed with getting her brace approved by Medicaid. I have filled that out unfortunately that information has been sent to the orthopedic office that I worked at 2 1/2 years ago and not tired Current wound care measures. Fortunately she seems to be doing very well at this time. 11/23/18 on evaluation today patient appears to be doing More Poorly Compared to Last Time I Saw Her. At [facility] She Had Completely Healed. Currently she is continuing to have issues with reopening. She states that she just found out that the brace was approved through Medicaid now she just has to  go get measured in order to have this fitted for her and then made. Subsequently she does not have an  appointment for this yet that is going to complicate things we obviously cannot put her back in the cast if we do not have everything measured because they're not gonna be able to measure her foot while she is in the cast. 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 Obviously this is unfortunate and does have me somewhat worried as well. 11/30/18 on evaluation today patient's toe ulcer actually appears to be doing fairly well. The good news is she will be getting her brace in the shoes next week on Wednesday. Hopefully we will be able to get this to heal without having to go back in the cast however she may need the cast in order to get the wound completely heal and then go from there. Fortunately there's no signs of infection at this time. 12/07/18 on evaluation today patient fortunately did receive her brace and she states she could tell this definitely makes her walk better. With that being said she's been having issues with her toe where she noticed yesterday there was a lot of tissue that was loosing off this appears to be much larger than what it was previous. She also states that her leg has been read putting much across the top of her foot just about the ankle although this seems to be receiving somewhat. The total area is still red and appears to be someone infected as best I can tell. She is previously taken Bactrim and that may be a good option for her today as well. We are gonna see what I wound culture shows as well and I think that this is definitely appropriate. With that being said outside of the culture I still need to initiate something in the interim and that's what I'm gonna go ahead and select Bactrim is a good option for her. 12/14/18 on evaluation today patient appears to be doing  better in regard to her left great toe ulcer as compared to last week's evaluation. There's still some erythema although this is significantly improved which is excellent news. Overall I do believe that she is making good progress is still gonna take some time before she is where I would like her to be from the standpoint of being able to place her back into the total contact cast. Hopefully we will be where we need to be by next week. 12/21/18 on evaluation today patient actually appears to be doing poorly in regard to her toe ulcer. She's been tolerating the dressing changes without complication. Fortunately there's no signs of systemic infection although she does have a lot of drainage from the toe ulcer and this does seem to be causing some issues at this point. She does have erythema on the distal portion of her toe that appears to be likely cellulitis. 12/28/18 on evaluation today patient actually appears to be doing a little better in my pinion in regard to her toe ulcer. With that being said she still does have some evidence of infection at this time and for her culture she had both E. coli as well as enterococcus as organisms noted on evaluation. For that reason I think that though the Keflex likely has treated the E. coli rather well this has really done nothing for the enterococcus. We are going to have to initiate treatment for this specifically. [name], [name] ([id number]) 01/04/19 on evaluation today patient's toe actually appears to be doing  better from the standpoint of infection. She currently would like to see about putting the cash back on I think that this is appropriate as long as she takes care of it and keeps it from getting wet. She is gonna have some drainage we can definitely pass this up with Drawtex and alginate to try to prevent as much drainage as possible from causing the problems. With that being said I do want to at least try her with the cast between now and Tuesday. If  there any issues we can't continue to use it then I will discontinue the use of the cast at that point. 01/08/19 on evaluation today patient actually appears to be doing very well as far as her foot ulcer specifically the great toe on the left is concerned. She did have an area of rubbing on the medial aspect of her left ankle which again is from the cast. Fortunately there's no signs of infection at this point in this appears to be a very slight skin breakdown. The patient tells me she felt it rubbing but didn't think it was that bad. Fortunately there is no signs of active infection at this time which is good news. No fevers, chills, nausea, or vomiting noted at this time. 01/15/19 on evaluation today patient actually appears to be doing well in regard to her toe ulcer. Again as previous she seems to do well and she has the cast on which indicates to me that during the time she doesn't have a cast on she's putting way too much pressure on this region. Obviously I think that's gonna be an issue as with the current national emergency concerning the Covid-19 Virus it has been recommended that we discontinue the use of total contact casting by the chief medical officer of our company, Dr. Simona Huh. The reasoning is that if a patient becomes sick and cannot come into have the cast removed they could not just leave this on for an additional two weeks. Obviously the hospitals also do not want to receive patient's who are sick into the emergency department to potentially contaminate the region and spread the Covid-19 Virus among other sick individuals within the hospital system. Therefore at this point we are suspending the use of total contact cast until the current emergency subsides. This was all discussed with the patient today as well. 01/22/19 on evaluation today patient's wound on her left great toe appears to be doing slightly worse than previously noted last week. She tells me that she has been on this  quite a bit in fact she tells me she's been awake for 38 straight hours. This is due to the fact that she's having to care for grandparents because nobody else will. She has been taking care of them for five the last seven days since I've seen her they both have dementia his is from a stroke and her grandmother's was progressive. Nonetheless she states even her mom who knows her condition and situation has only help two of those days to take care of them she's been taking care of the rest. Fortunately there does not appear to be any signs of active infection in regard to her toe at this point although obviously it doesn't look as good as it did previous. I think this is directly related to her not taking off the pressure and friction by way of taking things easy. Though I completely understand what's going on. 01/29/19 on evaluation today patient's tools are actually appears to be showing  some signs of improvement today compared to last week's evaluation as far as not necessarily the overall size of the wound but the fact that she has some new skin growth in between the two ends of the wound opening. Overall I feel like she has done well she states that she had a family member give her what sounds to be a CAM walker boot which has been helpful as well. 02/05/19 on evaluation today patient's wound bed actually appears to be doing significantly better in regard to her overall appearance of the size of the wound. With that being said she is still having an issue with offloading efficiently enough to get this to close. Apparently there is some signs of infection at this point as well unfortunately. Previously she's done well of Augmentin I really do not see anything that needs to be culture currently but there theme and cellulitis of the foot that I'm seeing I'm gonna go ahead and place her on an antibiotic today to try to help clear this up. 02/12/2019 on evaluation today patient actually appears to be doing  poorly in regard to her overall wound status. She tells me she has been using her offloading shoe but actually comes in today wearing her tennis shoe with the AFO brace. Again as I previously discussed with her this is really not sufficient to allow the area to heal appropriately. Nonetheless she continues to be somewhat noncompliant and I do wonder based on what she has told my nurse in the past as to whether or not a good portion of this noncompliance may be recreational drug and alcohol related. She has had a history of heroin overdose and this was fairly recently in the past couple of months that have been seeing her. Nonetheless overall I feel like her wound looks significantly worse today compared to what it was previous. She still has significant erythema despite the Augmentin I am not sure that this is an appropriate medication for her infection I am also concerned that the infection may have gone down into her bone. 02/19/19 on evaluation today patient actually appears to be doing about the same in regard to her toe ulcer. Unfortunately she continues to show signs of bone exposure and infection at this point. There does not appear to be any evidence of worsening of the infection but I'm also not really sure that it's getting significantly better. She is on the Augmentin which should be sufficient for the Staphylococcus aureus infection that she has at this point. With that being said she may need IV antibiotics to more appropriately treat this. We did have a discussion today about hyperbaric option therapy. 02/28/19 on evaluation today patient actually appears to be doing much worse in regard to the wound on her left great toe as compared to even my previous evaluation last week. Unfortunately this seems to be training in a pretty poor direction. Her toe was actually now starting to angle laterally and I can actually see the entire joint area of the proximal portion of the digit where is the  distal portion of the digit again is no longer even in contact with the joint line. Unfortunately there's a lot more necrotic tissue around the edge and the toe appears to be showing signs of becoming gangrenous in my pinion. I'm very concerned about were things stand at this point. She did see infectious disease and they are planning to send in a prescription for Sivextro for her and apparently this has been approved.  With that being said I don't think she should avoid taking this but at the same time I'm not sure that it's gonna be sufficient to save her toe at this point. 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 03/05/19 on evaluation today patient actually appears to be doing quite well in regard to her toe all things considering. She still has bone exposed but there appears to be much less your thing on overall the appearance of the wound and the toe itself is dramatically improved. She still does have some issues currently obviously with infection she did see vascular as well and there concerned that her blood flow to the toad. For that reason they are setting up for an angiogram next week. 03/14/19 on evaluation today patient appears to be doing very poor in regard to her toe and specifically in regard to the ulceration and the fact that she's starting to notice the toe was leaning even more towards the lateral aspect and the complete joint is visible on the proximal aspect of the joint. Nonetheless she's also noted a significant odor and the tip of the toe is turning more dark and necrotic appearing. Overall I think she is getting worse not better as far as this is concerned. For that reason I am recommending at this point that she likely needs to be seen for  likely amputation. READMISSION 03/19/2021 This is a patient that we cared for in this clinic for a prolonged period of time in 2019 and 2020 with a left foot and left first toe wound. 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 ([id number]) 04/09/20 by Dr. Luana Shu. In December 2021 she had an ulcer on her right great toe as well as the fourth and fifth toes. 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 She also had an angiogram at that time and underwent angioplasty of the right anterior tibial artery. In any case she claims that the wound on the right foot is closed I did not look at this today which was probably an oversight although I think that should be done next week. After her surgery she developed a dehiscence but I do not see any follow-up of this. According to Dr. Deborra Medina last review that she was out of the area being cared for by another physician but recently came back to his attention. 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 A culture of this area showed E. coli apparently before she came back to see Dr. Luana Shu she was supposed to be receiving antibiotics but she did not really take them. Nor is she offloading this area at all. Finally her last hemoglobin A1c listed in epic was in March 2022 at 14.1 she says things are a lot better since then although I am not sure. 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 She had this wound at the time. During this admission she had E. coli sepsis a TEE was negative. Past medical history is extensive and includes type 2 diabetes with peripheral neuropathy cardiomyopathy with an ejection fraction of 33%, hypertension, hyperlipidemia  chronic renal failure stage III history of substance abuse with cocaine  although she claims to be clean now verified by her mother. She is still a heavy cigarette smoker. She has a history of bipolar disorder seizure disorder ABI in our clinic was 1.05 6/1; left midfoot in the setting of a TMA done previously. Round circular wound with a "knuckle" of protruding tissue. The problem is that the knuckle was not attached to any of the surrounding granulation and this probed proximally widely I removed a large portion of this tissue. This wound goes with considerable undermining laterally. I do not feel any bone there was no purulence but this is a deep wound. 6/8; in spite of the debridement I did last week. She arrives with a wound looking exactly the same. A protruding "knuckle" of tissue nonadherent to most of the surrounding tissue. There is considerable depth around this from 6-12 o'clock at 2.7 cm and undermining of 1 cm. This does not look overtly infected and the x-ray I did last week was negative for any osseous abnormalities. We have been using silver collagen 6/15; deep tissue culture I did last week showed moderate staph aureus and moderate Pseudomonas. This will definitely require prolonged antibiotic therapy. The pathology on the protuberant area was negative for malignancy fungus etc. 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 We have been using silver collagen. I am going to be prescribing Levaquin for 2 weeks. Her CT scan of the foot is down for 7/5 6/22; CT scan of the foot on 7 5. She says she has hardware in the left leg from her previous fracture. She is on the Levaquin for the deep tissue culture I did that showed methicillin sensitive staph aureus and Pseudomonas. I gave her a 2-week supply and she will have another week. She arrives in clinic today with the same protuberant tissue however this is nonadherent to the tissue surrounding it. I am really at a loss to explain this unless there is  underlying deep tissue infection 6/29; patient presents for 1 week follow-up. She has been using collagen to the wound bed. She reports taking her antibiotics as prescribed.She has no complaints or issues today. She denies signs of infection. 7/6; patient presents for one week followup. She has been using collagen to the wound bed. She states she is taking Levaquin however at times she is not able to keep it down. She denies signs of infection. 7/13; patient presents for 1 week follow-up. She has been using silver alginate to the wound bed. She still has nausea when taking her antibiotics. She denies signs of infection. 7/20; patient presents for 1 week follow-up. She has been using silver alginate with gentamicin cream to the wound bed. She denies any issues and has no complaints today. She denies signs of infection. 7/27; patient presents for 1 week follow-up. She continues to use silver alginate with gentamicin cream to the wound bed. She reports starting her antibiotics. She has no issues or complaints. Overall she reports stability to the wound. 8/3; patient presents for 1 week follow-up. She has been using silver alginate with gentamicin cream to the wound bed. She reports completing all antibiotics. She has no issues or complaints today. She denies signs of infection. 8/17; patient presents for 2-week follow-up. He is to use silver alginate to the wound bed. She has no issues or complaints today. She denies signs of infection. She reports her pain has improved in her  foot since last clinic visit 8/24; patient presents for 1 week follow-up. She continues to use silver alginate to the wound bed. She has no issues or complaints. She denies signs of infection. Pain is stable. 9/7; patient presents for follow-up. She missed her last week appointment due to feeling ill. She continues to use silver alginate. She has a new wound to the right lower extremity that is covered in eschar. She states It  occurred over the past week and has no idea how it started. She currently denies signs of infection. 9/14; patient presents for follow-up. To the left foot wound she has been using gentamicin cream and silver alginate. To the right lower extremity wound she has been keeping this covered and has not obtain Santyl. 9/21; patient presents for follow-up. She reports using gentamicin cream and silver alginate to the left foot and Santyl to the right lower extremity wound. She has no issues or complaints today. She denies signs of infection. Electronic Signature(s) Signed: 07/15/2021 9:23:39 AM By: Kalman Shan DO Entered By: Kalman Shan on 07/15/2021 09:19:32 [name] ([id number]) -------------------------------------------------------------------------------- Physical Exam Details Patient Name: Heather Dear A. Date of Service: 07/14/2021 10:45 AM Medical Record Number: [id number] Patient Account Number: [id number] Date of Birth/Sex: [birth year] (45 y.o. F) Treating RN: Dolan Amen Primary Care Provider: Raelene Bott Other Clinician: Referring Provider: Raelene Bott Treating Provider/Extender: Yaakov Guthrie in Treatment: 16 Constitutional . Psychiatric . Notes Left foot (Transmetatarsal amputation): Wound to the plantar aspect that that has granulation tissue and nonviable tissue present. Right lower extremity: To the anterior aspect there is an open wound with slough and nonviable tissue throughout. Post debridement there was more granulation tissue present. Electronic Signature(s) Signed: 07/15/2021 9:23:39 AM By: Kalman Shan DO Entered By: Kalman Shan on 07/15/2021 09:21:01 [name] ([id number]) -------------------------------------------------------------------------------- Physician Orders Details Patient Name: Heather Dear A. Date of Service: 07/14/2021 10:45 AM Medical Record Number: [id number] Patient Account Number:  [id number] Date of Birth/Sex: 05/01/1976 (45 y.o. F) Treating RN: Dolan Amen Primary Care Provider: Raelene Bott Other Clinician: Referring Provider: Raelene Bott Treating Provider/Extender: Yaakov Guthrie in Treatment: 55 Verbal / Phone Orders: No Diagnosis Coding Follow-up Appointments o Return Appointment in 1 week. Bathing/ Shower/ Hygiene o Clean wound with Normal Saline or wound cleanser. Off-Loading Left Lower Extremity o Open toe surgical shoe with peg assist. o Turn and reposition every 2 hours Wound Treatment Wound #7 - Foot Wound Laterality: Plantar, Left, Midline Cleanser: Soap and Water 3 x Per Week/30 Days Discharge Instructions: Gently cleanse wound with antibacterial soap, rinse and pat dry prior to dressing wounds Peri-Wound Care: Desitin Maximum Strength Ointment 4 (oz) 3 x Per Week/30 Days Discharge Instructions: Peri wound Topical: Gentamicin 3 x Per Week/30 Days Discharge Instructions: Apply to wound bed and tunnel area under the dressing Topical: Santyl Collagenase Ointment, 30 (gm), tube 3 x Per Week/30 Days Discharge Instructions: Apply nickel thick to wound bed IN OFFICE ONLY Primary Dressing: Silvercel Small 2x2 (in/in) 3 x Per Week/30 Days Discharge Instructions: Apply to wound bed Secondary Dressing: ABD Pad 5x9 (in/in) 3 x Per Week/30 Days Discharge Instructions: Cover with ABD pad on top of dressing Secondary Dressing: Gauze 3 x Per Week/30 Days Discharge Instructions: To hold dressing in place Secured With: Kerlix Roll Sterile or Non-Sterile 6-ply 4.5x4 (yd/yd) 3 x Per Week/30 Days Discharge Instructions: Apply Kerlix as directed Wound #8 - Lower Leg Wound Laterality: Right, Anterior Cleanser: Normal Saline 1 x Per Day/30 Days Discharge Instructions: [facility]  your hands with soap and water. Remove old dressing, discard into plastic bag and place into trash. Cleanse the wound with Normal Saline prior to applying a clean dressing  using gauze sponges, not tissues or cotton balls. Do not scrub or use excessive force. Pat dry using gauze sponges, not tissue or cotton balls. Topical: Santyl Collagenase Ointment, 30 (gm), tube 1 x Per Day/30 Days Discharge Instructions: apply nickel thick to wound bed only Secondary Dressing: [place] Dressing, 4x4 (in/in) 1 x Per Day/30 Days Discharge Instructions: Apply over dressing to secure in place. Electronic Signature(s) Signed: 07/14/2021 4:25:23 PM By: Dolan Amen RN Signed: 07/15/2021 9:23:39 AM By: Owens Shark, Cordie AMarland Kitchen ([id number]) Entered By: Dolan Amen on 07/14/2021 11:45:12 [name] ([id number]) -------------------------------------------------------------------------------- Problem List Details Patient Name: Heather Dear A. Date of Service: 07/14/2021 10:45 AM Medical Record Number: [id number] Patient Account Number: [id number] Date of Birth/Sex: 05/25/1976 (45 y.o. F) Treating RN: Dolan Amen Primary Care Provider: Raelene Bott Other Clinician: Referring Provider: Raelene Bott Treating Provider/Extender: Yaakov Guthrie in Treatment: 16 Active Problems ICD-10 Encounter Code Description Active Date MDM Diagnosis 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 03/19/2021 No Yes severity S81.801D Unspecified open wound, right lower leg, subsequent encounter 07/07/2021 No Yes E11.621 Type 2 diabetes mellitus with foot ulcer 03/19/2021 No Yes E11.42 Type 2 diabetes mellitus with diabetic polyneuropathy 03/19/2021 No Yes Inactive Problems ICD-10 Code Description Active Date Inactive Date S81.801A Unspecified open wound, right lower leg, initial encounter 06/30/2021 06/30/2021 Resolved Problems Electronic Signature(s) Signed: 07/15/2021 9:23:39 AM By: Kalman Shan DO Entered By: Kalman Shan on 07/15/2021 [place], Majel A.  ([id number]) -------------------------------------------------------------------------------- Progress Note Details Patient Name: Heather Dear A. Date of Service: 07/14/2021 10:45 AM Medical Record Number: [id number] Patient Account Number: [id number] Date of Birth/Sex: Mar 19, 1976 (45 y.o. F) Treating RN: Dolan Amen Primary Care Provider: Raelene Bott Other Clinician: Referring Provider: Raelene Bott Treating Provider/Extender: Yaakov Guthrie in Treatment: 16 Subjective Chief Complaint Information obtained from Patient 03/19/2021; patient referred by Dr. Luana Shu who has been looking after her left foot for quite a period of time for review of a nonhealing area in the left midfoot History of Present Illness (HPI) 01/18/18-She is here for initial evaluation of the left great toe ulcer. She is a poor historian in regards to timeframe in detail. She states approximately 4 weeks ago she lacerated her toe on something in the house. She followed up with her primary care who placed her on Bactrim and ultimately a second dose of Bactrim prior to coming to wound clinic. She states she has been treating the toe with peroxide, Betadine and a Band-Aid. She did not check her blood sugar this morning but checked it yesterday morning it was 327; she is unaware of a recent A1c and there are no current records. She saw Dr. she would've orthopedics last week for an old injury to the left ankle, she states he did not see her toe, nor did she bring it to his attention. She smokes approximately 1 pack cigarettes a day. Her social situation is concerning, she arrives this morning with her mother who appears extremely intoxicated/under the influence; her mother was asked to leave the room and be monitored by the patient's grandmother. The patient's aunt then accompanied the patient and the room throughout the rest of the appointment. We had a lengthy discussion regarding the deleterious effects of  uncontrolled hyperglycemia and smoking as it relates  to wound healing and overall health. She was strongly encouraged to decrease her smoking and get her diabetes under better control. She states she is currently on a diet and has cut down her [facility] consumption. The left toe is erythematous, macerated and slightly edematous with malodor present. The edema in her left foot is below her baseline, there is no erythema streaking. We will treat her with Santyl, doxycycline; we have ordered and xray, culture and provided a Peg assist surgical shoe and cultured the wound. 01/25/18-She is here in follow-up evaluation for a left great toe ulcer and presents with an abscess to her suprapubic area. She states her blood sugars remain elevated, feeling "sick" and if levels are below 250, but she is trying. She has made no attempt to decrease her smoking stating that we "can't take away her food in her cigarettes". She has been compliant with offloading using the PEG assist you. She is using Santyl daily. the culture obtained last week grew staph aureus and Enterococcus faecalis; continues on the doxycycline and Augmentin was added on Monday. The suprapubic area has erythema, no femoral variation, purple discoloration, minimal induration, was accessed with a cotton tip applicator with sanguinopurulent drainage, this was cultured, I suspect the current antibiotic treatment will cover and we will not add anything to her current treatment plan. She was advised to go to urgent care or ER with any change in redness, induration or fever. 02/01/18-She is here in follow-up evaluation for left great toe ulcers and a new abdominal abscess from last week. She was able to use packing until earlier this week, where she "forgot it was there". She states she was feeling ill with GI symptoms last week and was not taking her antibiotic. She states her glucose levels have been predominantly less than 200, with occasional levels  between 200-250. She thinks this was contributing to her GI symptoms as they have resolved without intervention. There continues to be significant laceration to left toe, otherwise it clinically looks stable/improved. There is now less superficial opening to the lateral aspect of the great toe that was residual blister. We will transition to [facility] to all wounds, she will continue her Augmentin. If there is no change or deterioration next week for reculture. 02/08/18-She is here in follow-up evaluation for left great toe ulcer and abdominal ulcer. There is an improvement in both wounds. She has been wrapping her left toe with coban, not by our direction, which has created an area of discoloration to the medial aspect; she has been advised to NOT use coban secondary to her neuropathy. She states her glucose levels have been high over this last week ranging from 200-350, she continues to smoke. She admits to being less compliant with her offloading shoe. We will continue with same treatment plan and she will follow-up next week. 02/15/18-She is here in follow-up evaluation for left great toe ulcer and abdominal ulcer. The abdominal ulcer is epithelialized. The left great toe ulcer is improved and all injury from last week using the Coban wrap is resolved, the lateral ulcer is healed. She admits to noncompliance with wearing offloading shoe and admits to glucose levels being greater than 300 most of the week. She continues to smoke and expresses no desire to quit. There is one area medially that probes deeper than it has historically, erythema to the toe and dorsal foot has consistently waxed and waned. There is no overt signs of cellulitis or infection but we will culture the wound  for any occult infection given the new area of depth and erythema. We will hold off on sensitivities for initiation of antibiotic therapy. 02/22/18-She is here in follow up evaluation for left great toe ulcer. There is  overall significant improvement in both wound appearance, erythema and edema with changes made last week. She was not initiated on antibiotic therapy. Culture obtained last week showed oxacillin sensitive staph aureus, sensitive to clindamycin. Clindamycin has been called into the pharmacy but she has been instructed to hold off on initiation secondary to overall clinical improvement and her history of antibiotic intolerance. She has been instructed to contact the clinic with any noted changes/deterioration and the wound, erythema, edema and/or pain. She will follow-up next week. She continues to smoke and her glucose levels remain elevated >250; she admits to compliance with offloading shoe 03/01/18 on evaluation today patient appears to be doing fairly well in regard to her left first toe ulcer. She has been tolerating the dressing changes with the [facility] Dressing without complication and overall this has definitely showed signs of improvement according to records as well is what the patient tells me today. I'm very pleased in that regard. She is having no pain today 03/08/18 She is here for follow up evaluation of a left great toe ulcer. She remains non-compliant with glucose control and smoking cessation; glucose levels consistently >200. She states that she got new shoe inserts/peg assist. She admits to compliance with offloading. Since my last evaluation there is significant improvement. We will switch to prisma at this time and she will follow up next week. She is noted to be tachycardic at this appointment, heart rate 120s; she has a history of heart rate 70-130 according to our records. She admits to extreme agitation r/t personal issues; she was advised to monitor her heartrate and contact her physician if it does not return to a more normal range (<100). She takes cardizem twice daily. 03/15/18-She is here in follow-up evaluation for left great toe ulcer. She remains noncompliant with  glucose control and smoking cessation. She admits to compliance with wearing offloading shoe. The ulcer is improved/stable and we will continue with the same treatment plan and she will follow-up next week 03/22/18-She is here for evaluation for left great toe ulcer. There continues to be significant improvement despite recurrent hyperglycemia (over 500 yesterday) and she continues to smoke. She has been compliant with offloading and we will continue with same treatment plan and she will follow-up next week. [name], [name] ([id number]) 03/29/18-She is here for evaluation for left great toe ulcer. Despite continuing to smoke and uncontrolled diabetes she continues to improve. She is compliant with offloading shoe. We will continue with the same treatment plan and she will follow-up next week 04/05/18- She is here in follow up evaluation for a left great toe ulcer; she presents with small pustule to left fifth toe (resembles ant bite). She admits to compliance with wearing offloading shoe; continues to smoke or have uncontrolled blood glucose control. There is more callus than usual with evidence of bleeding; she denies known trauma. 04/12/18-She is here for evaluation of left great toe ulcer. Despite noncompliance with glycemic control and smoking she continues to make improvement. She continues to wear offloading shoe. The pustule, that was identified last week, to the left fifth toe is resolved. She will follow-up in 2 weeks 05/03/18-she is seen in follow-up evaluation for a left great toe ulcer. She is compliant with offloading, otherwise noncompliant with glycemic control and  smoking. She has plateaued and there is minimal improvement noted. We will transition to [facility], replaced the insert to her surgical shoe and she will follow-up in one week 05/10/18- She is here in follow up evaluation for a left great toe ulcer. It appears stable despite measurement change. We will continue with  same treatment plan and follow up next week. 05/24/18-She is seen in follow-up evaluation for a left great toe ulcer. She remains compliant with offloading, has made significant improvement in her diet, decreasing the amount of sugar/soda. She said her recent A1c was 10.9 which is lower than. She did see a diabetic nutritionist/educator yesterday. She continues to smoke. We will continue with the same treatment plan and she'll follow-up next week. 05/31/18- She is seen in follow-up evaluation for left great toe ulcer. She continues to remain compliant with offloading, continues to make improvement in her diet, increasing her water and decreasing the amount of sugar/soda. She does continue to smoke with no desire to quit. We will apply Prisma to the depth and Hydrofera Blue over. We have not received insurance authorization for oasis. She will follow up next week. 06/07/18-She is seen in follow-up evaluation for left great toe ulcer. It has stalled according to today's measurements although base appears stable. She says she saw a diabetic educator yesterday; her average blood sugars are less than 300 which is an improvement for her. She continues to smoke and states "that's my next step" She continues with water over soda. We will order for xray, culture and reinstate ace wrap compression prior to placing apligraf for next week. She is voicing no complaints or concerns. Her dressing will change to iodoflex over the next week in preparation for apligraf. 06/14/18-She is seen in follow-up evaluation for left great toe ulcer. Plain film x-ray performed last week was negative for osteomyelitis. Wound culture obtained last week grew strep B and OSSA; she is initiated on keflex and cefdinir today; there is erythema to the toe which could be from ace wrap compression, she has a history of wrapping too tight and has has been encouraged to maintain ace wraps that we place today. We will hold off on application of  apligraf today, will apply next week after antibiotic therapy has been initiated. She admits today that she has resumed taking a shower with her foot/toe submerged in water, she has been reminded to keep foot/toe out of the bath water. She will be seen in follow up next week 06/21/18-she is seen in follow-up evaluation for left great toe ulcer. She is tolerating antibiotic therapy with no GI disturbance. The wound is stable. Apligraf was applied today. She has been decreasing her smoking, only had 4 cigarettes yesterday and 1 today. She continues being more compliant in diabetic diet. She will follow-up next week for evaluation of site, if stable will remove at 2 weeks. 06/28/18- She is here in follow up evalution. Apligraf was placed last week, she states the dressing fell off on Tuesday and she was dressing with hydrofera blue. She is healed and will be discharged from the clinic today. She has been instructed to continue with smoking cessation, continue monitoring glucose levels, offloading for an additional 4 weeks and continue with hydrofera blue for additional two weeks for any possible microscopic opening. Readmission: 08/07/18 on evaluation today patient presents for reevaluation concerning the ulcer of her right great toe. She was previously discharged on 06/28/18 healed. Nonetheless she states that this began to show signs of drainage she  subsequently went to her primary care provider. Subsequently an x-ray was performed on 08/01/18 which was negative. The patient was also placed on antibiotics at that time. Fortunately they should have been effective for the infection. Nonetheless she's been experiencing some improvement but still has a lot of drainage coming from the wound itself. 08/14/18 on evaluation today patient's wound actually does show signs of improvement in regard to the erythema at this point. She has completed the antibiotics. With that being said we did discuss the possibility of  placing her in a total contact cast as of today although I think that I may want to give this just a little bit more time to ensure nothing recurrence as far as her infection is concerned. I do not want to put in the cast and risk infection at that time if things are not completely resolved. With that being said she is gonna require some debridement today. 08/21/18 on evaluation today patient actually appears to be doing okay in regard to her toe ulcer. She's been tolerating the dressing changes without complication. With that being said it does appear that she is ready and in fact I think it's appropriate for [place] to go ahead and initiate the total contact cast today. Nonetheless she will require some sharp debridement to prepare the wound for application. Overall I feel like things have been progressing well but we do need to do something to get this to close more readily. 08/24/18 patient seen today for reevaluation after having had the total contact cast applied on Tuesday. She seems to have done very well the wound appears to be doing great and overall I'm pleased with the progress that she's made. There were no abnormal areas of rubbing from the cast on her lower extremity. 08/30/18 on evaluation today patient actually appears to be completely healed in regard to her plantar toe ulcer. She tells me at this point she's been having a lot of issues with the cast. She almost fell a couple of times the state shall the step of her dog a couple times as well. This is been a very frustrating process for her other nonetheless she has completely healed the wound which is excellent news. Overall there does not appear to be the evidence of infection at this time which is great news. 09/11/18 evaluation today patient presents for follow-up concerning her great toe ulcer on the left which has unfortunately reopened since I last saw her which was only a couple of weeks ago. Unfortunately she was not able to get in  to get the shoe and potentially the AFO that's gonna be necessary due to her left foot drop. She continues with offloading shoe but this is not enough to prevent her from reopening it appears. When we last had her in the total contact cast she did well from a healing standpoint but unfortunately the wound reopened as soon as she came out of the cast within just a couple of weeks. 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 This is also part of the reason why she falls on a regular basis. I just do not believe that is gonna be safe for [place] to reinitiate the total contact cast as last time we had this on she fell 3 times one  week which is definitely not normal for her. 09/18/18 upon evaluation today the patient actually appears to be doing about the same in regard to her toe ulcer. She did not contact [employer] as I asked her to even though I had given her the prescription. In fact she actually states that she has no idea where the prescription is. She did apparently call [facility] and they told her that all she needed to do was bring the prescription in order to be able to be seen and work on getting the AFO for her left foot. With all that being said she still does not have an appointment and I'm not sure were things stand that regard. I will give her a new prescription today in order to contact them to get this set up. [name], [name] ([id number]) 09/25/18 on evaluation today patient actually appears to be doing about the same in regard to her toes ulcer. She does have a small areas which seems to have a lot of callous buildup around the edge of the wound which is going to need sharp debridement today. She still is waiting to be scheduled for evaluation with [facility] for possibility of an AFO. She states there  supposed to call her tomorrow to get this set up. Unfortunately it does appear that her foot specifically the toe area is showing signs of erythema. There does not appear to be any systemic infection which is in these good news. 10/02/18 on evaluation today patient actually appears to be doing about the same in regard to her toe ulcer. This really has not done too well although it's not significantly larger it's also not significantly smaller. She has been tolerating the dressing changes without complication. She actually has her appointment with [facility] and [facility] tomorrow to hopefully be measured for obtaining and AFO splint. I think this would be helpful preventing this from reoccurring. We had contemplated starting the cast this week although to be honest I am reluctant to do that as she's been having nausea, vomiting, and seizure activity over the past three days. She has a history of seizures and have been told is nothing that can be done for these. With that being said I do believe that along with the seizures have the nausea vomiting which upon further questioning doesn't seem to be the normal for her and makes me concerned for the possibility of infection or something else going on. I discussed this with the patient and her mother during the office visit today. I do not feel the wound is effective but maybe something else. The responses this was "this just happens to her at times and we don't know why". They did not seem to be interested in going to the hospital to have this checked out further. 10/09/18 on evaluation today patient presents for follow-up concerning her ongoing toe ulcer. She has been tolerating the dressing changes without complication. Fortunately there does not appear to be any evidence of infection which is great news however I do think that the patient would benefit from going ahead for with the total contact cast. She's actually in a wheelchair today she tells me that  she will use her walker if we initiate the cast. I was very specific about the fact that if we were gonna do the cast I wanted to make sure that she was using the walker in order to prevent any falls. She tells me she does not have stairs that she has to traverse on a regular basis  at her home. She has not had any seizures since last week again that something that happens to her often she tells me she did talk to [employer] and they said that it may take up to three weeks to get the brace approved for her. Hopefully that will not take that long but nonetheless in the meantime I do think the cast could be of benefit. 10/12/18 on evaluation today patient appears to be doing rather well in regard to her toe ulcer. It's just been a few days and already this is significantly improved both as far as overall appearance and size. Fortunately there's no sign of infection. She is here for her first obligatory cast change. 10/19/18 Seen today for follow up and management of left great toe ulcer. Wound continues to show improvement. Noted small open area with seroussang drainage with palpation. Denies any increased pain or recent fevers during visit. She will continue calcium alginate with offloading shoe. Denies any questions or concerns during visit. 10/26/18 on evaluation today patient appears to be doing about the same as when I last saw her in regard to her wound bed. Fortunately there does not appear to be any signs of infection. Unfortunately she continues to have a breakdown in regard to the toe region any time that she is not in the cast. It takes almost no time at all for this to happen. Nonetheless she still has not heard anything from the brace being made by [employer] as to when exactly this will be available to her. Fortunately there is no signs of infection at this time. 10/30/18 on evaluation today patient presents for application of the total contact cast as we just received him this morning. Fortunately  we are gonna be able to apply this to her today which is great news. She continues to have no significant pain which is good news. Overall I do feel like things have been improving while she was the cast is when she doesn't have a cast that things get worse. She still has not really heard anything from [place] regarding her brace. 11/02/18 upon evaluation today patient's wound already appears to be doing significantly better which is good news. Fortunately there does not appear to be any signs of infection also good news. Overall I do think the total contact cast as before is helping to heal this area unfortunately it's just not gonna likely keep the area closed and healed without her getting her brace at least. Again the foot drop is a significant issue for her. 11/09/18 on evaluation today patient appears to be doing excellent in regard to her toe ulcer which in fact is completely healed. Fortunately we finally got the situation squared away with the paperwork which was needed to proceed with getting her brace approved by Medicaid. I have filled that out unfortunately that information has been sent to the orthopedic office that I worked at 2 1/2 years ago and not tired Current wound care measures. Fortunately she seems to be doing very well at this time. 11/23/18 on evaluation today patient appears to be doing More Poorly Compared to Last Time I Saw Her. At [facility] She Had Completely Healed. Currently she is continuing to have issues with reopening. She states that she just found out that the brace was approved through Medicaid now she just has to go get measured in order to have this fitted for her and then made. Subsequently she does not have an appointment for this yet that is going to  complicate things we obviously cannot put her back in the cast if we do not have everything measured because they're not gonna be able to measure her foot while she is in the cast. Unfortunately the other thing  that I found out today as well is that she was in the hospital over the weekend due to having a heroin overdose. Obviously this is unfortunate and does have me somewhat worried as well. 11/30/18 on evaluation today patient's toe ulcer actually appears to be doing fairly well. The good news is she will be getting her brace in the shoes next week on Wednesday. Hopefully we will be able to get this to heal without having to go back in the cast however she may need the cast in order to get the wound completely heal and then go from there. Fortunately there's no signs of infection at this time. 12/07/18 on evaluation today patient fortunately did receive her brace and she states she could tell this definitely makes her walk better. With that being said she's been having issues with her toe where she noticed yesterday there was a lot of tissue that was loosing off this appears to be much larger than what it was previous. She also states that her leg has been read putting much across the top of her foot just about the ankle although this seems to be receiving somewhat. The total area is still red and appears to be someone infected as best I can tell. She is previously taken Bactrim and that may be a good option for her today as well. We are gonna see what I wound culture shows as well and I think that this is definitely appropriate. With that being said outside of the culture I still need to initiate something in the interim and that's what I'm gonna go ahead and select Bactrim is a good option for her. 12/14/18 on evaluation today patient appears to be doing better in regard to her left great toe ulcer as compared to last week's evaluation. There's still some erythema although this is significantly improved which is excellent news. Overall I do believe that she is making good progress is still gonna take some time before she is where I would like her to be from the standpoint of being able to place her back into  the total contact cast. Hopefully we will be where we need to be by next week. 12/21/18 on evaluation today patient actually appears to be doing poorly in regard to her toe ulcer. She's been tolerating the dressing changes without complication. Fortunately there's no signs of systemic infection although she does have a lot of drainage from the toe ulcer and this does [name], [name] ([id number]) seem to be causing some issues at this point. She does have erythema on the distal portion of her toe that appears to be likely cellulitis. 12/28/18 on evaluation today patient actually appears to be doing a little better in my pinion in regard to her toe ulcer. With that being said she still does have some evidence of infection at this time and for her culture she had both E. coli as well as enterococcus as organisms noted on evaluation. For that reason I think that though the Keflex likely has treated the E. coli rather well this has really done nothing for the enterococcus. We are going to have to initiate treatment for this specifically. 01/04/19 on evaluation today patient's toe actually appears to be doing better from the standpoint of infection. She  currently would like to see about putting the cash back on I think that this is appropriate as long as she takes care of it and keeps it from getting wet. She is gonna have some drainage we can definitely pass this up with Drawtex and alginate to try to prevent as much drainage as possible from causing the problems. With that being said I do want to at least try her with the cast between now and Tuesday. If there any issues we can't continue to use it then I will discontinue the use of the cast at that point. 01/08/19 on evaluation today patient actually appears to be doing very well as far as her foot ulcer specifically the great toe on the left is concerned. She did have an area of rubbing on the medial aspect of her left ankle which again is from the cast.  Fortunately there's no signs of infection at this point in this appears to be a very slight skin breakdown. The patient tells me she felt it rubbing but didn't think it was that bad. Fortunately there is no signs of active infection at this time which is good news. No fevers, chills, nausea, or vomiting noted at this time. 01/15/19 on evaluation today patient actually appears to be doing well in regard to her toe ulcer. Again as previous she seems to do well and she has the cast on which indicates to me that during the time she doesn't have a cast on she's putting way too much pressure on this region. Obviously I think that's gonna be an issue as with the current national emergency concerning the Covid-19 Virus it has been recommended that we discontinue the use of total contact casting by the chief medical officer of our company, Dr. Simona Huh. The reasoning is that if a patient becomes sick and cannot come into have the cast removed they could not just leave this on for an additional two weeks. Obviously the hospitals also do not want to receive patient's who are sick into the emergency department to potentially contaminate the region and spread the Covid-19 Virus among other sick individuals within the hospital system. Therefore at this point we are suspending the use of total contact cast until the current emergency subsides. This was all discussed with the patient today as well. 01/22/19 on evaluation today patient's wound on her left great toe appears to be doing slightly worse than previously noted last week. She tells me that she has been on this quite a bit in fact she tells me she's been awake for 38 straight hours. This is due to the fact that she's having to care for grandparents because nobody else will. She has been taking care of them for five the last seven days since I've seen her they both have dementia his is from a stroke and her grandmother's was progressive. Nonetheless she states even  her mom who knows her condition and situation has only help two of those days to take care of them she's been taking care of the rest. Fortunately there does not appear to be any signs of active infection in regard to her toe at this point although obviously it doesn't look as good as it did previous. I think this is directly related to her not taking off the pressure and friction by way of taking things easy. Though I completely understand what's going on. 01/29/19 on evaluation today patient's tools are actually appears to be showing some signs of improvement today compared to  last week's evaluation as far as not necessarily the overall size of the wound but the fact that she has some new skin growth in between the two ends of the wound opening. Overall I feel like she has done well she states that she had a family member give her what sounds to be a CAM walker boot which has been helpful as well. 02/05/19 on evaluation today patient's wound bed actually appears to be doing significantly better in regard to her overall appearance of the size of the wound. With that being said she is still having an issue with offloading efficiently enough to get this to close. Apparently there is some signs of infection at this point as well unfortunately. Previously she's done well of Augmentin I really do not see anything that needs to be culture currently but there theme and cellulitis of the foot that I'm seeing I'm gonna go ahead and place her on an antibiotic today to try to help clear this up. 02/12/2019 on evaluation today patient actually appears to be doing poorly in regard to her overall wound status. She tells me she has been using her offloading shoe but actually comes in today wearing her tennis shoe with the AFO brace. Again as I previously discussed with her this is really not sufficient to allow the area to heal appropriately. Nonetheless she continues to be somewhat noncompliant and I do wonder based  on what she has told my nurse in the past as to whether or not a good portion of this noncompliance may be recreational drug and alcohol related. She has had a history of heroin overdose and this was fairly recently in the past couple of months that have been seeing her. Nonetheless overall I feel like her wound looks significantly worse today compared to what it was previous. She still has significant erythema despite the Augmentin I am not sure that this is an appropriate medication for her infection I am also concerned that the infection may have gone down into her bone. 02/19/19 on evaluation today patient actually appears to be doing about the same in regard to her toe ulcer. Unfortunately she continues to show signs of bone exposure and infection at this point. There does not appear to be any evidence of worsening of the infection but I'm also not really sure that it's getting significantly better. She is on the Augmentin which should be sufficient for the Staphylococcus aureus infection that she has at this point. With that being said she may need IV antibiotics to more appropriately treat this. We did have a discussion today about hyperbaric option therapy. 02/28/19 on evaluation today patient actually appears to be doing much worse in regard to the wound on her left great toe as compared to even my previous evaluation last week. Unfortunately this seems to be training in a pretty poor direction. Her toe was actually now starting to angle laterally and I can actually see the entire joint area of the proximal portion of the digit where is the distal portion of the digit again is no longer even in contact with the joint line. Unfortunately there's a lot more necrotic tissue around the edge and the toe appears to be showing signs of becoming gangrenous in my pinion. I'm very concerned about were things stand at this point. She did see infectious disease and they are planning to send in a  prescription for Sivextro for her and apparently this has been approved. With that being said I don't think  she should avoid taking this but at the same time I'm not sure that it's gonna be sufficient to save her toe at this point. 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 03/05/19 on evaluation today patient actually appears to be doing quite well in regard to her toe all things considering. She still has bone exposed but there appears to be much less your thing on overall the appearance of the wound and the toe itself is dramatically improved. She still does have some issues currently obviously with infection she did see vascular as well and there concerned that her blood flow to the toad. For that reason they are setting up for an angiogram next week. 03/14/19 on evaluation today patient appears to be doing very poor in regard to her toe and specifically in regard to the ulceration and the fact that she's starting to notice the toe was leaning even more towards the lateral aspect and the complete joint is visible on the proximal aspect of the joint. Nonetheless she's also noted a significant odor and the tip of the toe is turning more dark and necrotic appearing. Overall I think she is getting worse not better as far as this is concerned. For that reason I am recommending at this point that she likely needs to be seen for likely amputation. [name], [name] ([id number]) READMISSION 03/19/2021 This is a patient that we cared for in this clinic for a prolonged period of time in 2019 and 2020 with a left foot and left first toe wound. 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  Luana Shu. In December 2021 she had an ulcer on her right great toe as well as the fourth and fifth toes. 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 She also had an angiogram at that time and underwent angioplasty of the right anterior tibial artery. In any case she claims that the wound on the right foot is closed I did not look at this today which was probably an oversight although I think that should be done next week. After her surgery she developed a dehiscence but I do not see any follow-up of this. According to Dr. Deborra Medina last review that she was out of the area being cared for by another physician but recently came back to his attention. 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 A culture of this area showed E. coli apparently before she came back to see Dr. Luana Shu she was supposed to be receiving antibiotics but she did not really take them. Nor is she offloading this area at all. Finally her last hemoglobin A1c listed in epic was in March 2022 at 14.1 she says things are a lot better since then although I am not sure. 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 She had this wound at the time. During this admission she had E. coli sepsis a TEE was negative. Past medical history is extensive and includes type 2 diabetes with peripheral neuropathy cardiomyopathy with an ejection fraction of 33%, hypertension, hyperlipidemia chronic renal failure stage III history of  substance abuse with cocaine although she claims to be clean now verified by her mother. She is still a heavy cigarette smoker. She has a history of bipolar disorder seizure disorder ABI in our clinic was 1.05 6/1; left midfoot in the setting of a TMA done previously. Round circular wound with a "knuckle" of protruding tissue. The problem is that the knuckle was not attached to any of the surrounding  granulation and this probed proximally widely I removed a large portion of this tissue. This wound goes with considerable undermining laterally. I do not feel any bone there was no purulence but this is a deep wound. 6/8; in spite of the debridement I did last week. She arrives with a wound looking exactly the same. A protruding "knuckle" of tissue nonadherent to most of the surrounding tissue. There is considerable depth around this from 6-12 o'clock at 2.7 cm and undermining of 1 cm. This does not look overtly infected and the x-ray I did last week was negative for any osseous abnormalities. We have been using silver collagen 6/15; deep tissue culture I did last week showed moderate staph aureus and moderate Pseudomonas. This will definitely require prolonged antibiotic therapy. The pathology on the protuberant area was negative for malignancy fungus etc. 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 We have been using silver collagen. I am going to be prescribing Levaquin for 2 weeks. Her CT scan of the foot is down for 7/5 6/22; CT scan of the foot on 7 5. She says she has hardware in the left leg from her previous fracture. She is on the Levaquin for the deep tissue culture I did that showed methicillin sensitive staph aureus and Pseudomonas. I gave her a 2-week supply and she will have another week. She arrives in clinic today with the same protuberant tissue however this is nonadherent to the tissue surrounding it. I am really at a loss to explain this unless there is underlying deep tissue infection 6/29; patient presents for 1 week follow-up. She has been using collagen to the wound bed. She reports taking her antibiotics as prescribed.She has no complaints or issues today. She denies signs of infection. 7/6; patient presents for one week followup. She has been using collagen to the wound bed. She states she is taking Levaquin however at  times she is not able to keep it down. She denies signs of infection. 7/13; patient presents for 1 week follow-up. She has been using silver alginate to the wound bed. She still has nausea when taking her antibiotics. She denies signs of infection. 7/20; patient presents for 1 week follow-up. She has been using silver alginate with gentamicin cream to the wound bed. She denies any issues and has no complaints today. She denies signs of infection. 7/27; patient presents for 1 week follow-up. She continues to use silver alginate with gentamicin cream to the wound bed. She reports starting her antibiotics. She has no issues or complaints. Overall she reports stability to the wound. 8/3; patient presents for 1 week follow-up. She has been using silver alginate with gentamicin cream to the wound bed. She reports completing all antibiotics. She has no issues or complaints today. She denies signs of infection. 8/17; patient presents for 2-week follow-up. He is to use silver alginate to the wound bed. She has no issues or complaints today. She denies signs of infection. She reports her pain has improved in her foot since last clinic visit 8/24; patient  presents for 1 week follow-up. She continues to use silver alginate to the wound bed. She has no issues or complaints. She denies signs of infection. Pain is stable. 9/7; patient presents for follow-up. She missed her last week appointment due to feeling ill. She continues to use silver alginate. She has a new wound to the right lower extremity that is covered in eschar. She states It occurred over the past week and has no idea how it started. She currently denies signs of infection. 9/14; patient presents for follow-up. To the left foot wound she has been using gentamicin cream and silver alginate. To the right lower extremity wound she has been keeping this covered and has not obtain Santyl. 9/21; patient presents for follow-up. She reports using  gentamicin cream and silver alginate to the left foot and Santyl to the right lower extremity wound. She has no issues or complaints today. She denies signs of infection. Patient History Information obtained from Patient. Social History Current every day smoker, Marital Status - Single, Alcohol Use - Never, Drug Use - Prior History, Caffeine Use - Daily. Medical History Ear/Nose/Mouth/Throat Patient has history of Chronic sinus problems/congestion, Middle ear problems Hematologic/Lymphatic Patient has history of Anemia Respiratory Patient has history of Chronic Obstructive Pulmonary Disease (COPD) Cardiovascular Patient has history of Congestive Heart Failure - EF 33% Endocrine [name], [name] ([id number]) Patient has history of Type II Diabetes Genitourinary Patient has history of End Stage Renal Disease Integumentary (Skin) Patient has history of History of pressure wounds Neurologic Patient has history of Neuropathy - feel and legs Objective Constitutional Vitals Time Taken: 11:13 AM, Height: 69 in, Weight: 185 lbs, BMI: 27.3, Temperature: 98.6 F, Pulse: 102 bpm, Respiratory Rate: 18 breaths/min, Blood Pressure: 117/83 mmHg. General Notes: Left foot (Transmetatarsal amputation): Wound to the plantar aspect that that has granulation tissue and nonviable tissue present. Right lower extremity: To the anterior aspect there is an open wound with slough and nonviable tissue throughout. Post debridement there was more granulation tissue present. Integumentary (Hair, Skin) Wound #7 status is Open. Original cause of wound was Gradually Appeared. The date acquired was: 08/07/2020. The wound has been in treatment 16 weeks. The wound is located on the [place]. The wound measures 2.2cm length x 2.5cm width x 0.3cm depth; 4.32cm^2 area and 1.296cm^3 volume. There is Fat Layer (Subcutaneous Tissue) exposed. There is no tunneling or undermining noted. There is a medium  amount of serosanguineous drainage noted. The wound margin is thickened. There is large (67-100%) red, pink, hyper - granulation within the wound bed. There is a small (1-33%) amount of necrotic tissue within the wound bed including Adherent Slough. Wound #8 status is Open. Original cause of wound was Gradually Appeared. The date acquired was: 06/30/2021. The wound has been in treatment 2 weeks. The wound is located on the Right,Anterior Lower Leg. The wound measures 4cm length x 2.8cm width x 0.2cm depth; 8.796cm^2 area and 1.759cm^3 volume. There is Fat Layer (Subcutaneous Tissue) exposed. There is no tunneling or undermining noted. There is a medium amount of serosanguineous drainage noted. There is small (1-33%) red granulation within the wound bed. There is a large (67-100%) amount of necrotic tissue within the wound bed including Adherent Slough. Assessment Active Problems ICD-10 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ity Unspecified open wound, right lower leg, subsequent encounter Type 2 diabetes mellitus with foot ulcer Type 2 diabetes mellitus with diabetic polyneuropathy Patient's left plantar foot wound has shown  improvement in appearance with the use of gentamicin cream, silver alginate and clindamycin. I think this is the best surface I will be able to get to start a skin substitute. She has been approved for Apligraf and we will place this in office next visit. As for the right lower extremity wound she was able to obtain Santyl and I recommended continuing this. I debrided nonviable tissue. No signs of infection on exam. Procedures Wound #8 Pre-procedure diagnosis of Wound #8 is a Diabetic Wound/Ulcer of the Lower Extremity located on the Right,Anterior Lower Leg .Severity of Tissue Pre Debridement is: Fat layer exposed. There was a Selective/Open Wound Non-Viable Tissue Debridement with a total area of 11.2 sq cm performed by Kalman Shan, MD. With the following instrument(s): Curette to remove Non-Viable tissue/material. Material removed includes Valinda. A time out was conducted at 11:42, prior to the start of the procedure. A Minimum amount of bleeding was controlled with Pressure. The procedure was tolerated well. Post Debridement Measurements: 4cm length x 2.8cm width x 0.2cm depth; 1.759cm^3 volume. [name], [name] ([id number]) Character of Wound/Ulcer Post Debridement is stable. Severity of Tissue Post Debridement is: Fat layer exposed. Post procedure Diagnosis Wound #8: Same as Pre-Procedure Plan Follow-up Appointments: Return Appointment in 1 week. Bathing/ Shower/ Hygiene: Clean wound with Normal Saline or wound cleanser. Off-Loading: Open toe surgical shoe with peg assist. Turn and reposition every 2 hours WOUND #7: - Foot Wound Laterality: Plantar, Left, Midline Cleanser: Soap and Water 3 x Per Week/30 Days Discharge Instructions: Gently cleanse wound with antibacterial soap, rinse and pat dry prior to dressing wounds Peri-Wound Care: Desitin Maximum Strength Ointment 4 (oz) 3 x Per Week/30 Days Discharge Instructions: Peri wound Topical: Gentamicin 3 x Per Week/30 Days Discharge Instructions: Apply to wound bed and tunnel area under the dressing Topical: Santyl Collagenase Ointment, 30 (gm), tube 3 x Per Week/30 Days Discharge Instructions: Apply nickel thick to wound bed IN OFFICE ONLY Primary Dressing: [place] Small 2x2 (in/in) 3 x Per Week/30 Days Discharge Instructions: Apply to wound bed Secondary Dressing: ABD Pad 5x9 (in/in) 3 x Per Week/30 Days Discharge Instructions: Cover with ABD pad on top of dressing Secondary Dressing: Gauze 3 x Per Week/30 Days Discharge Instructions: To hold dressing in place Secured With: Kerlix Roll Sterile or Non-Sterile 6-ply 4.5x4 (yd/yd) 3 x Per Week/30 Days Discharge Instructions: Apply Kerlix as directed WOUND #8: - Lower Leg Wound Laterality: Right,  Anterior Cleanser: Normal Saline 1 x Per Day/30 Days Discharge Instructions: Wash your hands with soap and water. Remove old dressing, discard into plastic bag and place into trash. Cleanse the wound with Normal Saline prior to applying a clean dressing using gauze sponges, not tissues or cotton balls. Do not scrub or use excessive force. Pat dry using gauze sponges, not tissue or cotton balls. Topical: Santyl Collagenase Ointment, 30 (gm), tube 1 x Per Day/30 Days Discharge Instructions: apply nickel thick to wound bed only Secondary Dressing: [place] Dressing, 4x4 (in/in) 1 x Per Day/30 Days Discharge Instructions: Apply over dressing to secure in place. 1. Santyl daily to the right lower extremity wound 2. Gentamicin and silver alginate to the left plantar foot wound 3. Apligraf to be placed at next clinic visit 4. Follow-up in 1 week Electronic Signature(s) Signed: 07/15/2021 9:23:39 AM By: Kalman Shan DO Entered By: Kalman Shan on 07/15/2021 09:22:48 [name] ([id number]) -------------------------------------------------------------------------------- ROS/PFSH Details Patient Name: Heather Dear A. Date of Service: 07/14/2021 10:45 AM Medical Record Number: [id number]  Patient Account Number: [id number] Date of Birth/Sex: 1976-05-08 (45 y.o. F) Treating RN: Dolan Amen Primary Care Provider: Raelene Bott Other Clinician: Referring Provider: Raelene Bott Treating Provider/Extender: Yaakov Guthrie in Treatment: 16 Information Obtained From Patient Ear/Nose/Mouth/Throat Medical History: Positive for: Chronic sinus problems/congestion; Middle ear problems Hematologic/Lymphatic Medical History: Positive for: Anemia Respiratory Medical History: Positive for: Chronic Obstructive Pulmonary Disease (COPD) Cardiovascular Medical History: Positive for: Congestive Heart Failure - EF 33% Endocrine Medical History: Positive for: Type II  Diabetes Time with diabetes: 14 years Treated with: Insulin, Oral agents Blood sugar tested every day: No Blood sugar testing results: Bedtime: 176 Genitourinary Medical History: Positive for: End Stage Renal Disease Integumentary (Skin) Medical History: Positive for: History of pressure wounds Neurologic Medical History: Positive for: Neuropathy - feel and legs HBO Extended History Items Ear/Nose/Mouth/Throat: Ear/Nose/Mouth/Throat: Chronic sinus problems/congestion Middle ear problems Immunizations Pneumococcal Vaccine: Received Pneumococcal Vaccination: No [name], [name] ([id number]) Implantable Devices No devices added Family and Social History Current every day smoker; Marital Status - Single; Alcohol Use: Never; Drug Use: Prior History; Caffeine Use: Daily Electronic Signature(s) Signed: 07/15/2021 9:23:39 AM By: Kalman Shan DO Signed: 07/15/2021 3:00:07 PM By: Dolan Amen RN Entered By: Kalman Shan on 07/15/2021 09:19:50 [name] ([id number]) -------------------------------------------------------------------------------- SuperBill Details Patient Name: Heather Dear A. Date of Service: 07/14/2021 Medical Record Number: [id number] Patient Account Number: [id number] Date of Birth/Sex: 10/22/1976 (45 y.o. F) Treating RN: Dolan Amen Primary Care Provider: Raelene Bott Other Clinician: Referring Provider: Raelene Bott Treating Provider/Extender: Yaakov Guthrie in Treatment: 16 Diagnosis Coding ICD-10 Codes Code Description [id number] 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ity S81.801D Unspecified open wound, right lower leg, subsequent encounter E11.621 Type 2 diabetes mellitus with foot ulcer E11.42 Type 2 diabetes mellitus with diabetic polyneuropathy Facility Procedures CPT4 Code: [id number] Description: [id number] - DEBRIDE WOUND 1ST 20 SQ CM OR < Modifier: Quantity: 1 CPT4  Code: Description: ICD-10 Diagnosis Description S81.801D Unspecified open wound, right lower leg, subsequent encounter Modifier: Quantity: Physician Procedures CPT4 [id number] Description: [id number] - WC PHYS LEVEL 3 - EST PT Modifier: Quantity: 1 CPT4 Code: Description: ICD-10 Diagnosis Description 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 S81.801D Unspecified open wound, right lower leg, subsequent encounter E11.621 Type 2 diabetes mellitus with foot ulcer E11.42  Type 2 diabetes mellitus with diabetic polyneuropathy Modifier: severity Quantity: CPT4 Code: [id number] Description: [id number] - WC PHYS DEBR WO ANESTH 20 SQ CM Modifier: Quantity: 1 CPT4 Code: Description: ICD-10 Diagnosis Description S81.801D Unspecified open wound, right lower leg, subsequent encounter Modifier: Quantity: Electronic Signature(s) Signed: 07/15/2021 9:23:39 AM By: Kalman Shan DO Previous Signature: 07/14/2021 4:25:23 PM Version By: Dolan Amen RN Entered By: Kalman Shan on 07/15/2021 09:23:10

## 2021-07-21 ENCOUNTER — Other Ambulatory Visit: Payer: Self-pay

## 2021-07-21 ENCOUNTER — Encounter (HOSPITAL_BASED_OUTPATIENT_CLINIC_OR_DEPARTMENT_OTHER): Payer: Medicaid Other | Admitting: Internal Medicine

## 2021-07-21 DIAGNOSIS — L97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ity: Secondary | ICD-10-CM | POA: Diagnosis not present

## 2021-07-21 DIAGNOSIS — S81801D Unspecified open wound, right lower leg, subsequent encounter: Secondary | ICD-10-CM

## 2021-07-21 DIAGNOSIS — S91301A Unspecified open wound, right foot, initial encounter: Secondary | ICD-10-CM

## 2021-07-21 DIAGNOSIS — E11621 Type 2 diabetes mellitus with foot ulcer: Secondary | ICD-10-CM | POA: Diagnosis not present

## 2021-07-21 NOTE — Progress Notes (Signed)
SHAWNELLE, BOUKNIGHT ([id number]) Visit Report for 07/21/2021 Arrival Information Details Patient Name: [name], [name] Date of Service: 07/21/2021 10:45 AM Medical Record Number: [id number] Patient Account Number: [id number] Date of Birth/Sex: [birth year] (45 y.o. F) Treating RN: Dolan Amen Primary Care Anjel Pardo: Raelene Bott Other Clinician: Referring Londen Bok: Raelene Bott Treating Maron Stanzione/Extender: Yaakov Guthrie in Treatment: 57 Visit Information History Since Last Visit Pain Present Now: No Patient Arrived: Wheel Chair Arrival Time: 11:04 Accompanied By: mother Transfer Assistance: None Patient Identification Verified: Yes Secondary Verification Process Completed: Yes Patient Requires Transmission-Based No Precautions: Patient Has Alerts: Yes Patient Alerts: Patient on Blood Thinner '81mg'$  apirin Diabetic Type II Electronic Signature(s) Signed: 07/21/2021 4:20:49 PM By: Dolan Amen RN Entered By: Dolan Amen on 07/21/2021 11:04:50 [name] ([id number]) -------------------------------------------------------------------------------- Encounter Discharge Information Details Patient Name: Heather Dear A. Date of Service: 07/21/2021 10:45 AM Medical Record Number: [id number] Patient Account Number: [id number] Date of Birth/Sex: 1976/08/19 (45 y.o. F) Treating RN: Dolan Amen Primary Care Yariah Selvey: Raelene Bott Other Clinician: Referring Calyn Rubi: Raelene Bott Treating Kamauri Kathol/Extender: Yaakov Guthrie in Treatment: 17 Encounter Discharge Information Items Post Procedure Vitals Discharge Condition: Stable Temperature (F): 98.9 Ambulatory Status: Wheelchair Pulse (bpm): 93 Discharge Destination: Home Respiratory Rate (breaths/min): 18 Transportation: Private Auto Blood Pressure (mmHg): 130/93 Accompanied By: mother Schedule Follow-up Appointment: Yes Clinical Summary of Care: Electronic Signature(s) Signed: 07/21/2021 4:20:49  PM By: Dolan Amen RN Entered By: Dolan Amen on 07/21/2021 11:53:15 Eddington, Bryanne A. ([id number]) -------------------------------------------------------------------------------- Lower Extremity Assessment Details Patient Name: Heather Dear A. Date of Service: 07/21/2021 10:45 AM Medical Record Number: [id number] Patient Account Number: [id number] Date of Birth/Sex: [birth year] (45 y.o. F) Treating RN: Dolan Amen Primary Care Valoria Tamburri: Raelene Bott Other Clinician: Referring Fendi Meinhardt: Raelene Bott Treating Gloris Shiroma/Extender: Yaakov Guthrie in Treatment: 17 Edema Assessment Assessed: [name]: Yes] [name]: No] [Left: Edema] [Right: :] Vascular Assessment Pulses: Dorsalis Pedis Palpable: [Left:Yes] Electronic Signature(s) Signed: 07/21/2021 4:20:49 PM By: Dolan Amen RN Entered By: Dolan Amen on 07/21/2021 [name] ([id number]) -------------------------------------------------------------------------------- Multi Wound Chart Details Patient Name: Heather Dear A. Date of Service: 07/21/2021 10:45 AM Medical Record Number: [id number] Patient Account Number: [id number] Date of Birth/Sex: Sep 04, 1976 (45 y.o. F) Treating RN: Dolan Amen Primary Care Tylin Force: Raelene Bott Other Clinician: Referring Dotty Gonzalo: Raelene Bott Treating Keaira Whitehurst/Extender: Yaakov Guthrie in Treatment: 17 Vital Signs Height(in): 53 Pulse(bpm): 39 Weight(lbs): 185 Blood Pressure(mmHg): 130/93 Body Mass Index(BMI): 27 Temperature(F): 98.9 Respiratory Rate(breaths/min): 18 Photos: Wound Location: Left, Midline, Plantar Foot Right, Anterior Lower Leg Right Calcaneus Wounding Event: Gradually Appeared Gradually Appeared Gradually Appeared Primary Etiology: Pressure Ulcer Diabetic Wound/Ulcer of the Lower Diabetic Wound/Ulcer of the Lower Extremity Extremity Comorbid History: Chronic sinus problems/congestion, Chronic sinus problems/congestion,  Chronic sinus problems/congestion, Middle ear problems, Anemia, Middle ear problems, Anemia, Middle ear problems, Anemia, Chronic Obstructive Pulmonary Chronic Obstructive Pulmonary Chronic Obstructive Pulmonary Disease (COPD), Congestive Heart Disease (COPD), Congestive Heart Disease (COPD), Congestive Heart Failure, Type II Diabetes, End Stage Failure, Type II Diabetes, End Stage Failure, Type II Diabetes, End Stage Renal Disease, History of pressure Renal Disease, History of pressure Renal Disease, History of pressure wounds, Neuropathy wounds, Neuropathy wounds, Neuropathy Date Acquired: 08/07/2020 06/30/2021 07/18/2021 Weeks of Treatment: 17 3 0 Wound Status: Open Open Open Measurements L x W x D (cm) 2.5x4x0.8 4x2.5x0.2 0.6x0.7x0.7 Area (cm) : 7.854 7.854 0.33 Volume (cm) : 6.283 1.571 0.231 % Reduction in Area: -194.20% -25.00% N/A % Reduction in Volume: -370.60% -150.20% N/A Starting  Position 1 (o'clock): 12 Ending Position 1 (o'clock): 12 Maximum Distance 1 (cm): 0.6 Undermining: No No Yes Classification: Category/Stage II Grade 1 Grade 1 Exudate Amount: Medium Medium Medium Exudate Type: Purulent Serosanguineous Serous Exudate Color: yellow, brown, green red, brown amber Wound Margin: Thickened N/A Thickened Granulation Amount: Medium (34-66%) Medium (34-66%) Large (67-100%) Granulation Quality: Red, Pink, Hyper-granulation Red Pink Necrotic Amount: Medium (34-66%) Medium (34-66%) Small (1-33%) Exposed Structures: Fat Layer (Subcutaneous Tissue): Fat Layer (Subcutaneous Tissue): Fat Layer (Subcutaneous Tissue): Yes Yes Yes Fascia: No Fascia: No Fascia: No Tendon: No Tendon: No Tendon: No Muscle: No Muscle: No Muscle: No Joint: No Joint: No Joint: No Bone: No Bone: No Bone: No Epithelialization: None None None Debridement: N/A Chemical/Enzymatic/Mechanical N/A Pre-procedure Verification/Time N/A 11:34 N/A Out Taken: Instrument: N/A Other(cotton tipped  applicator) N/A Sidman, Mackenze A. ([id number]) Bleeding: N/A None N/A Debridement Treatment N/A Procedure was tolerated well N/A Response: Post Debridement N/A 4x2.5x0.2 N/A Measurements L x W x D (cm) Post Debridement Volume: N/A 1.571 N/A (cm) Procedures Performed: N/A Debridement N/A Treatment Notes Electronic Signature(s) Signed: 07/21/2021 12:31:15 PM By: Kalman Shan DO Entered By: Kalman Shan on 07/21/2021 11:51:58 [name] ([id number]) -------------------------------------------------------------------------------- [place] Plan Details Patient Name: Heather Dear A. Date of Service: 07/21/2021 10:45 AM Medical Record Number: [id number] Patient Account Number: [id number] Date of Birth/Sex: [birth year] (45 y.o. F) Treating RN: Dolan Amen Primary Care Deerica Waszak: Raelene Bott Other Clinician: Referring Delshon Blanchfield: Raelene Bott Treating Zetta Stoneman/Extender: Yaakov Guthrie in Treatment: 17 Active Inactive Electronic Signature(s) Signed: 07/21/2021 4:20:49 PM By: Dolan Amen RN Entered By: Dolan Amen on 07/21/2021 11:31:17 Kitch, Fiza A. ([id number]) -------------------------------------------------------------------------------- Non-Wound Condition Assessment Details Patient Name: Heather Dear A. Date of Service: 07/21/2021 10:45 AM Medical Record Number: [id number] Patient Account Number: [id number] Date of Birth/Gender: 11-18-1975 (45 y.o. F) Treating RN: Dolan Amen Primary Care Physician: Raelene Bott Other Clinician: Referring Physician: Raelene Bott Treating Physician/Extender: Yaakov Guthrie in Treatment: 17 Non-Wound Condition: Condition: Suspected Deep Tissue Injury Location: Foot Side: Left Notes: lateral aspect of foot Photos Electronic Signature(s) Signed: 07/21/2021 4:20:49 PM By: Dolan Amen RN Entered By: Dolan Amen on 07/21/2021 11:23:24 Heather Dear A.  ([id number]) -------------------------------------------------------------------------------- Pain Assessment Details Patient Name: Heather Dear A. Date of Service: 07/21/2021 10:45 AM Medical Record Number: [id number] Patient Account Number: [id number] Date of Birth/Sex: Oct 16, 1976 (45 y.o. F) Treating RN: Dolan Amen Primary Care Kathaleen Dudziak: Raelene Bott Other Clinician: Referring Oryan Winterton: Raelene Bott Treating Brigitt Mcclish/Extender: Yaakov Guthrie in Treatment: 17 Active Problems Location of Pain Severity and Description of Pain Patient Has Paino Yes Site Locations Pain Location: Pain in Ulcers Rate the pain. Current Pain Level: 6 Pain Management and Medication Current Pain Management: Electronic Signature(s) Signed: 07/21/2021 4:20:49 PM By: Dolan Amen RN Entered By: Dolan Amen on 07/21/2021 11:05:15 [name] ([id number]) -------------------------------------------------------------------------------- Patient/Caregiver Education Details Patient Name: Heather Dear A. Date of Service: 07/21/2021 10:45 AM Medical Record Number: [id number] Patient Account Number: [id number] Date of Birth/Gender: 27-Jul-1976 (45 y.o. F) Treating RN: Dolan Amen Primary Care Physician: Raelene Bott Other Clinician: Referring Physician: Raelene Bott Treating Physician/Extender: Yaakov Guthrie in Treatment: 17 Education Assessment Education Provided To: Patient Education Topics Provided Wound/Skin Impairment: Methods: Explain/Verbal Responses: State content correctly Electronic Signature(s) Signed: 07/21/2021 4:20:49 PM By: Dolan Amen RN Entered By: Dolan Amen on 07/21/2021 11:51:59 Woodham, Sukhman A. ([id number]) -------------------------------------------------------------------------------- Wound Assessment Details Patient Name: Heather Dear A. Date of Service: 07/21/2021 10:45 AM Medical Record Number:  [id number] Patient Account Number:  [id number] Date of Birth/Sex: [birth year] (45 y.o. F) Treating RN: Dolan Amen Primary Care Christophe Rising: Raelene Bott Other Clinician: Referring Kristyn Obyrne: Raelene Bott Treating Chayanne Filippi/Extender: Yaakov Guthrie in Treatment: 17 Wound Status Wound Number: 7 Primary Pressure Ulcer Etiology: Wound Location: Left, Midline, Plantar Foot Wound Open Wounding Event: Gradually Appeared Status: Date Acquired: 08/07/2020 Comorbid Chronic sinus problems/congestion, Middle ear problems, Weeks Of Treatment: 17 History: Anemia, Chronic Obstructive Pulmonary Disease (COPD), Clustered Wound: No Congestive Heart Failure, Type II Diabetes, End Stage Renal Disease, History of pressure wounds, Neuropathy Photos Wound Measurements Length: (cm) 2.5 Width: (cm) 4 Depth: (cm) 0.8 Area: (cm) 7.854 Volume: (cm) 6.283 % Reduction in Area: -194.2% % Reduction in Volume: -370.6% Epithelialization: None Tunneling: No Undermining: No Wound Description Classification: Category/Stage II Wound Margin: Thickened Exudate Amount: Medium Exudate Type: Purulent Exudate Color: yellow, brown, green Foul Odor After Cleansing: No Slough/Fibrino Yes Wound Bed Granulation Amount: Medium (34-66%) Exposed Structure Granulation Quality: Red, Pink, Hyper-granulation Fascia Exposed: No Necrotic Amount: Medium (34-66%) Fat Layer (Subcutaneous Tissue) Exposed: Yes Necrotic Quality: Adherent Slough Tendon Exposed: No Muscle Exposed: No Joint Exposed: No Bone Exposed: No Treatment Notes Wound #7 (Foot) Wound Laterality: Plantar, Left, Midline Cleanser Soap and Water Discharge Instruction: Gently cleanse wound with antibacterial soap, rinse and pat dry prior to dressing wounds Mccowan, Daejah A. ([id number]) Peri-Wound Care Topical Gentamicin Discharge Instruction: Apply to wound bed and tunnel area under the dressing Santyl Collagenase Ointment, 30 (gm), tube Discharge Instruction: Apply nickel  thick to wound bed IN OFFICE ONLY Primary Dressing Silvercel Small 2x2 (in/in) Discharge Instruction: Apply to wound bed Secondary Dressing ABD Pad 5x9 (in/in) Discharge Instruction: Cover with ABD pad on top of dressing Gauze Discharge Instruction: To hold dressing in place Secured With Kerlix Roll Sterile or Non-Sterile 6-ply 4.5x4 (yd/yd) Discharge Instruction: Apply Kerlix as directed Compression Wrap Compression Stockings Add-Ons Electronic Signature(s) Signed: 07/21/2021 4:20:49 PM By: Dolan Amen RN Entered By: Dolan Amen on 07/21/2021 11:15:13 Remlinger, Sherrian A. ([id number]) -------------------------------------------------------------------------------- Wound Assessment Details Patient Name: Heather Dear A. Date of Service: 07/21/2021 10:45 AM Medical Record Number: [id number] Patient Account Number: [id number] Date of Birth/Sex: 1975-12-03 (45 y.o. F) Treating RN: Dolan Amen Primary Care Sharyon Peitz: Raelene Bott Other Clinician: Referring Earlene Bjelland: Raelene Bott Treating Yvette Loveless/Extender: Yaakov Guthrie in Treatment: 17 Wound Status Wound Number: 8 Primary Diabetic Wound/Ulcer of the Lower Extremity Etiology: Wound Location: Right, Anterior Lower Leg Wound Open Wounding Event: Gradually Appeared Status: Date Acquired: 06/30/2021 Comorbid Chronic sinus problems/congestion, Middle ear problems, Weeks Of Treatment: 3 History: Anemia, Chronic Obstructive Pulmonary Disease (COPD), Clustered Wound: No Congestive Heart Failure, Type II Diabetes, End Stage Renal Disease, History of pressure wounds, Neuropathy Photos Wound Measurements Length: (cm) 4 Width: (cm) 2.5 Depth: (cm) 0.2 Area: (cm) 7.854 Volume: (cm) 1.571 % Reduction in Area: -25% % Reduction in Volume: -150.2% Epithelialization: None Tunneling: No Undermining: No Wound Description Classification: Grade 1 Exudate Amount: Medium Exudate Type: Serosanguineous Exudate Color:  red, brown Foul Odor After Cleansing: No Slough/Fibrino No Wound Bed Granulation Amount: Medium (34-66%) Exposed Structure Granulation Quality: Red Fascia Exposed: No Necrotic Amount: Medium (34-66%) Fat Layer (Subcutaneous Tissue) Exposed: Yes Necrotic Quality: Adherent Slough Tendon Exposed: No Muscle Exposed: No Joint Exposed: No Bone Exposed: No Treatment Notes Wound #8 (Lower Leg) Wound Laterality: Right, Anterior Cleanser Normal Saline Discharge Instruction: Wash your hands with soap and water. Remove old dressing, discard into plastic bag and place into trash. Cleanse the wound with Normal  Saline prior to applying a clean dressing using gauze sponges, not tissues or cotton balls. Do not Lukach, Adelaide A. ([id number]) scrub or use excessive force. Pat dry using gauze sponges, not tissue or cotton balls. Peri-Wound Care Topical Santyl Collagenase Ointment, 30 (gm), tube Discharge Instruction: apply nickel thick to wound bed only Primary Dressing Secondary [place], 4x4 (in/in) Discharge Instruction: Apply over dressing to secure in place. Secured With Compression Wrap Compression Stockings Add-Ons Electronic Signature(s) Signed: 07/21/2021 4:20:49 PM By: Dolan Amen RN Entered By: Dolan Amen on 07/21/2021 11:15:51 [name] ([id number]) -------------------------------------------------------------------------------- Wound Assessment Details Patient Name: Heather Dear A. Date of Service: 07/21/2021 10:45 AM Medical Record Number: [id number] Patient Account Number: [id number] Date of Birth/Sex: [birth year] (45 y.o. F) Treating RN: Dolan Amen Primary Care Dameisha Tschida: Raelene Bott Other Clinician: Referring Mardee Clune: Raelene Bott Treating Latrell Reitan/Extender: Yaakov Guthrie in Treatment: 17 Wound Status Wound Number: 9 Primary Diabetic Wound/Ulcer of the Lower Extremity Etiology: Wound Location: Right  Calcaneus Wound Open Wounding Event: Gradually Appeared Status: Date Acquired: 07/18/2021 Comorbid Chronic sinus problems/congestion, Middle ear problems, Weeks Of Treatment: 0 History: Anemia, Chronic Obstructive Pulmonary Disease (COPD), Clustered Wound: No Congestive Heart Failure, Type II Diabetes, End Stage Renal Disease, History of pressure wounds, Neuropathy Photos Wound Measurements Length: (cm) 0.6 % Red Width: (cm) 0.7 % Red Depth: (cm) 0.7 Epith Area: (cm) 0.33 Tunn Volume: (cm) 0.231 [address] En Ma uction in Area: uction in Volume: elialization: None eling: No rmining: Yes arting Position (o'clock): 12 ding Position (o'clock): 12 ximum Distance: (cm) 0.6 Wound Description Classification: Grade 1 Foul Wound Margin: Thickened Sloug Exudate Amount: Medium Exudate Type: Serous Exudate Color: amber Odor After Cleansing: No h/Fibrino No Wound Bed Granulation Amount: Large (67-100%) Exposed Structure Granulation Quality: Pink Fascia Exposed: No Necrotic Amount: Small (1-33%) Fat Layer (Subcutaneous Tissue) Exposed: Yes Necrotic Quality: Adherent Slough Tendon Exposed: No Muscle Exposed: No Joint Exposed: No Bone Exposed: No Treatment Notes Wound #9 (Calcaneus) Wound Laterality: Right Monks, Treanna A. ([id number]) Cleanser Soap and Water Discharge Instruction: Gently cleanse wound with antibacterial soap, rinse and pat dry prior to dressing wounds Peri-Wound Care Topical Primary Dressing Silvercel Small 2x2 (in/in) Discharge Instruction: Apply to wound bed Secondary Dressing ABD Pad 5x9 (in/in) Discharge Instruction: Cover with ABD pad on top of dressing Gauze Discharge Instruction: To hold dressing in place Secured With Kerlix Roll Sterile or Non-Sterile 6-ply 4.5x4 (yd/yd) Discharge Instruction: Apply Kerlix as directed Compression Wrap Compression Stockings Add-Ons Electronic Signature(s) Signed: 07/21/2021 4:20:49 PM By: Dolan Amen  RN Entered By: Dolan Amen on 07/21/2021 11:22:00 [name] ([id number]) -------------------------------------------------------------------------------- [place] Details Patient Name: Heather Dear A. Date of Service: 07/21/2021 10:45 AM Medical Record Number: [id number] Patient Account Number: [id number] Date of Birth/Sex: Aug 01, 1976 (45 y.o. F) Treating RN: Dolan Amen Primary Care Jalei Shibley: Raelene Bott Other Clinician: Referring Jakaylah Schlafer: Raelene Bott Treating Zaray Gatchel/Extender: Yaakov Guthrie in Treatment: 17 Vital Signs Time Taken: 11:04 Temperature (F): 98.9 Height (in): 69 Pulse (bpm): 93 Weight (lbs): 185 Respiratory Rate (breaths/min): 18 Body Mass Index (BMI): 27.3 Blood Pressure (mmHg): 130/93 Reference Range: 80 - 120 mg / dl Electronic Signature(s) Signed: 07/21/2021 4:20:49 PM By: Dolan Amen RN Entered By: Dolan Amen on 07/21/2021 11:05:05

## 2021-07-21 NOTE — Progress Notes (Signed)
TALIANNA, KUBIAK ([id number]) Visit Report for 07/21/2021 Chief Complaint Document Details Patient Name: [name], [name] Date of Service: 07/21/2021 10:45 AM Medical Record Number: [id number] Patient Account Number: [id number] Date of Birth/Sex: [birth year] (45 y.o. F) Treating RN: Dolan Amen Primary Care Provider: Raelene Bott Other Clinician: Referring Provider: Raelene Bott Treating Provider/Extender: Yaakov Guthrie in Treatment: 17 Information Obtained from: Patient Chief Complaint 03/19/2021; patient referred by Dr. Luana Shu who has been looking after her left foot for quite a period of time for review of a nonhealing area in the left midfoot Electronic Signature(s) Signed: 07/21/2021 12:31:15 PM By: Kalman Shan DO Entered By: Kalman Shan on 07/21/2021 11:52:20 Cress, Anaya A. ([id number]) -------------------------------------------------------------------------------- Debridement Details Patient Name: Heather Dear A. Date of Service: 07/21/2021 10:45 AM Medical Record Number: [id number] Patient Account Number: [id number] Date of Birth/Sex: 22-Dec-1975 (45 y.o. F) Treating RN: Dolan Amen Primary Care Provider: Raelene Bott Other Clinician: Referring Provider: Raelene Bott Treating Provider/Extender: Yaakov Guthrie in Treatment: 17 Debridement Performed for Wound #8 Right,Anterior Lower Leg Assessment: Performed By: Physician Kalman Shan, MD Debridement Type: Chemical/Enzymatic/Mechanical Agent Used: Santyl Severity of Tissue Pre Debridement: Fat layer exposed Level of Consciousness (Pre- Awake and Alert procedure): Pre-procedure Verification/Time Out Yes - 11:34 Taken: Start Time: 11:34 Instrument: Other : cotton tipped applicator Bleeding: None Response to Treatment: Procedure was tolerated well Level of Consciousness (Post- Awake and Alert procedure): Post Debridement Measurements of Total Wound Length: (cm) 4 Width:  (cm) 2.5 Depth: (cm) 0.2 Volume: (cm) 1.571 Character of Wound/Ulcer Post Debridement: Stable Severity of Tissue Post Debridement: Fat layer exposed Post Procedure Diagnosis Same as Pre-procedure Electronic Signature(s) Signed: 07/21/2021 12:31:15 PM By: Kalman Shan DO Signed: 07/21/2021 4:20:49 PM By: Dolan Amen RN Entered By: Dolan Amen on 07/21/2021 11:34:47 Foushee, Shannia A. ([id number]) -------------------------------------------------------------------------------- HPI Details Patient Name: Heather Dear A. Date of Service: 07/21/2021 10:45 AM Medical Record Number: [id number] Patient Account Number: [id number] Date of Birth/Sex: 01/23/1976 (45 y.o. F) Treating RN: Dolan Amen Primary Care Provider: Raelene Bott Other Clinician: Referring Provider: Raelene Bott Treating Provider/Extender: Yaakov Guthrie in Treatment: 17 History of Present Illness HPI Description: 01/18/18-She is here for initial evaluation of the left great toe ulcer. She is a poor historian in regards to timeframe in detail. She states approximately 4 weeks ago she lacerated her toe on something in the house. She followed up with her primary care who placed her on Bactrim and ultimately a second dose of Bactrim prior to coming to wound clinic. She states she has been treating the toe with peroxide, Betadine and a Band-Aid. She did not check her blood sugar this morning but checked it yesterday morning it was 327; she is unaware of a recent A1c and there are no current records. She saw Dr. she would've orthopedics last week for an old injury to the left ankle, she states he did not see her toe, nor did she bring it to his attention. She smokes approximately 1 pack cigarettes a day. Her social situation is concerning, she arrives this morning with her mother who appears extremely intoxicated/under the influence; her mother was asked to leave the room and be monitored by the patient's  grandmother. The patient's aunt then accompanied the patient and the room throughout the rest of the appointment. We had a lengthy discussion regarding the deleterious effects of uncontrolled hyperglycemia and smoking as it relates to wound healing and overall health. She was strongly encouraged to decrease her smoking and get her diabetes  under better control. She states she is currently on a diet and has cut down her [facility] consumption. The left toe is erythematous, macerated and slightly edematous with malodor present. The edema in her left foot is below her baseline, there is no erythema streaking. We will treat her with Santyl, doxycycline; we have ordered and xray, culture and provided a Peg assist surgical shoe and cultured the wound. 01/25/18-She is here in follow-up evaluation for a left great toe ulcer and presents with an abscess to her suprapubic area. She states her blood sugars remain elevated, feeling "sick" and if levels are below 250, but she is trying. She has made no attempt to decrease her smoking stating that we "can't take away her food in her cigarettes". She has been compliant with offloading using the PEG assist you. She is using Santyl daily. the culture obtained last week grew staph aureus and Enterococcus faecalis; continues on the doxycycline and Augmentin was added on Monday. The suprapubic area has erythema, no femoral variation, purple discoloration, minimal induration, was accessed with a cotton tip applicator with sanguinopurulent drainage, this was cultured, I suspect the current antibiotic treatment will cover and we will not add anything to her current treatment plan. She was advised to go to urgent care or ER with any change in redness, induration or fever. 02/01/18-She is here in follow-up evaluation for left great toe ulcers and a new abdominal abscess from last week. She was able to use packing until earlier this week, where she "forgot it was there". She  states she was feeling ill with GI symptoms last week and was not taking her antibiotic. She states her glucose levels have been predominantly less than 200, with occasional levels between 200-250. She thinks this was contributing to her GI symptoms as they have resolved without intervention. There continues to be significant laceration to left toe, otherwise it clinically looks stable/improved. There is now less superficial opening to the lateral aspect of the great toe that was residual blister. We will transition to [facility] to all wounds, she will continue her Augmentin. If there is no change or deterioration next week for reculture. 02/08/18-She is here in follow-up evaluation for left great toe ulcer and abdominal ulcer. There is an improvement in both wounds. She has been wrapping her left toe with coban, not by our direction, which has created an area of discoloration to the medial aspect; she has been advised to NOT use coban secondary to her neuropathy. She states her glucose levels have been high over this last week ranging from 200-350, she continues to smoke. She admits to being less compliant with her offloading shoe. We will continue with same treatment plan and she will follow-up next week. 02/15/18-She is here in follow-up evaluation for left great toe ulcer and abdominal ulcer. The abdominal ulcer is epithelialized. The left great toe ulcer is improved and all injury from last week using the Coban wrap is resolved, the lateral ulcer is healed. She admits to noncompliance with wearing offloading shoe and admits to glucose levels being greater than 300 most of the week. She continues to smoke and expresses no desire to quit. There is one area medially that probes deeper than it has historically, erythema to the toe and dorsal foot has consistently waxed and waned. There is no overt signs of cellulitis or infection but we will culture the wound for any occult infection given the new  area of depth and erythema. We will hold off on  sensitivities for initiation of antibiotic therapy. 02/22/18-She is here in follow up evaluation for left great toe ulcer. There is overall significant improvement in both wound appearance, erythema and edema with changes made last week. She was not initiated on antibiotic therapy. Culture obtained last week showed oxacillin sensitive staph aureus, sensitive to clindamycin. Clindamycin has been called into the pharmacy but she has been instructed to hold off on initiation secondary to overall clinical improvement and her history of antibiotic intolerance. She has been instructed to contact the clinic with any noted changes/deterioration and the wound, erythema, edema and/or pain. She will follow-up next week. She continues to smoke and her glucose levels remain elevated >250; she admits to compliance with offloading shoe 03/01/18 on evaluation today patient appears to be doing fairly well in regard to her left first toe ulcer. She has been tolerating the dressing changes with the [facility] Dressing without complication and overall this has definitely showed signs of improvement according to records as well is what the patient tells me today. I'm very pleased in that regard. She is having no pain today 03/08/18 She is here for follow up evaluation of a left great toe ulcer. She remains non-compliant with glucose control and smoking cessation; glucose levels consistently >200. She states that she got new shoe inserts/peg assist. She admits to compliance with offloading. Since my last evaluation there is significant improvement. We will switch to prisma at this time and she will follow up next week. She is noted to be tachycardic at this appointment, heart rate 120s; she has a history of heart rate 70-130 according to our records. She admits to extreme agitation r/t personal issues; she was advised to monitor her heartrate and contact her physician if it  does not return to a more normal range (<100). She takes cardizem twice daily. 03/15/18-She is here in follow-up evaluation for left great toe ulcer. She remains noncompliant with glucose control and smoking cessation. She admits to compliance with wearing offloading shoe. The ulcer is improved/stable and we will continue with the same treatment plan and she will follow-up next week 03/22/18-She is here for evaluation for left great toe ulcer. There continues to be significant improvement despite recurrent hyperglycemia (over 500 yesterday) and she continues to smoke. She has been compliant with offloading and we will continue with same treatment plan and she will follow-up next week. 03/29/18-She is here for evaluation for left great toe ulcer. Despite continuing to smoke and uncontrolled diabetes she continues to improve. She is compliant with offloading shoe. We will continue with the same treatment plan and she will follow-up next week 04/05/18- She is here in follow up evaluation for a left great toe ulcer; she presents with small pustule to left fifth toe (resembles ant bite). She admits to compliance with wearing offloading shoe; continues to smoke or have uncontrolled blood glucose control. There is more callus than usual with evidence of bleeding; she denies known trauma. 04/12/18-She is here for evaluation of left great toe ulcer. Despite noncompliance with glycemic control and smoking she continues to make Priola, Shanae A. ([id number]) improvement. She continues to wear offloading shoe. The pustule, that was identified last week, to the left fifth toe is resolved. She will follow-up in 2 weeks 05/03/18-she is seen in follow-up evaluation for a left great toe ulcer. She is compliant with offloading, otherwise noncompliant with glycemic control and smoking. She has plateaued and there is minimal improvement noted. We will transition to [facility], replaced the  insert to her surgical shoe and  she will follow-up in one week 05/10/18- She is here in follow up evaluation for a left great toe ulcer. It appears stable despite measurement change. We will continue with same treatment plan and follow up next week. 05/24/18-She is seen in follow-up evaluation for a left great toe ulcer. She remains compliant with offloading, has made significant improvement in her diet, decreasing the amount of sugar/soda. She said her recent A1c was 10.9 which is lower than. She did see a diabetic nutritionist/educator yesterday. She continues to smoke. We will continue with the same treatment plan and she'll follow-up next week. 05/31/18- She is seen in follow-up evaluation for left great toe ulcer. She continues to remain compliant with offloading, continues to make improvement in her diet, increasing her water and decreasing the amount of sugar/soda. She does continue to smoke with no desire to quit. We will apply Prisma to the depth and Hydrofera Blue over. We have not received insurance authorization for oasis. She will follow up next week. 06/07/18-She is seen in follow-up evaluation for left great toe ulcer. It has stalled according to today's measurements although base appears stable. She says she saw a diabetic educator yesterday; her average blood sugars are less than 300 which is an improvement for her. She continues to smoke and states "that's my next step" She continues with water over soda. We will order for xray, culture and reinstate ace wrap compression prior to placing apligraf for next week. She is voicing no complaints or concerns. Her dressing will change to iodoflex over the next week in preparation for apligraf. 06/14/18-She is seen in follow-up evaluation for left great toe ulcer. Plain film x-ray performed last week was negative for osteomyelitis. Wound culture obtained last week grew strep B and OSSA; she is initiated on keflex and cefdinir today; there is erythema to the toe which could be  from ace wrap compression, she has a history of wrapping too tight and has has been encouraged to maintain ace wraps that we place today. We will hold off on application of apligraf today, will apply next week after antibiotic therapy has been initiated. She admits today that she has resumed taking a shower with her foot/toe submerged in water, she has been reminded to keep foot/toe out of the bath water. She will be seen in follow up next week 06/21/18-she is seen in follow-up evaluation for left great toe ulcer. She is tolerating antibiotic therapy with no GI disturbance. The wound is stable. Apligraf was applied today. She has been decreasing her smoking, only had 4 cigarettes yesterday and 1 today. She continues being more compliant in diabetic diet. She will follow-up next week for evaluation of site, if stable will remove at 2 weeks. 06/28/18- She is here in follow up evalution. Apligraf was placed last week, she states the dressing fell off on Tuesday and she was dressing with hydrofera blue. She is healed and will be discharged from the clinic today. She has been instructed to continue with smoking cessation, continue monitoring glucose levels, offloading for an additional 4 weeks and continue with hydrofera blue for additional two weeks for any possible microscopic opening. Readmission: 08/07/18 on evaluation today patient presents for reevaluation concerning the ulcer of her right great toe. She was previously discharged on 06/28/18 healed. Nonetheless she states that this began to show signs of drainage she subsequently went to her primary care provider. Subsequently an x-ray was performed on 08/01/18 which was negative. The  patient was also placed on antibiotics at that time. Fortunately they should have been effective for the infection. Nonetheless she's been experiencing some improvement but still has a lot of drainage coming from the wound itself. 08/14/18 on evaluation today patient's  wound actually does show signs of improvement in regard to the erythema at this point. She has completed the antibiotics. With that being said we did discuss the possibility of placing her in a total contact cast as of today although I think that I may want to give this just a little bit more time to ensure nothing recurrence as far as her infection is concerned. I do not want to put in the cast and risk infection at that time if things are not completely resolved. With that being said she is gonna require some debridement today. 08/21/18 on evaluation today patient actually appears to be doing okay in regard to her toe ulcer. She's been tolerating the dressing changes without complication. With that being said it does appear that she is ready and in fact I think it's appropriate for [place] to go ahead and initiate the total contact cast today. Nonetheless she will require some sharp debridement to prepare the wound for application. Overall I feel like things have been progressing well but we do need to do something to get this to close more readily. 08/24/18 patient seen today for reevaluation after having had the total contact cast applied on Tuesday. She seems to have done very well the wound appears to be doing great and overall I'm pleased with the progress that she's made. There were no abnormal areas of rubbing from the cast on her lower extremity. 08/30/18 on evaluation today patient actually appears to be completely healed in regard to her plantar toe ulcer. She tells me at this point she's been having a lot of issues with the cast. She almost fell a couple of times the state shall the step of her dog a couple times as well. This is been a very frustrating process for her other nonetheless she has completely healed the wound which is excellent news. Overall there does not appear to be the evidence of infection at this time which is great news. 09/11/18 evaluation today patient presents for  follow-up concerning her great toe ulcer on the left which has unfortunately reopened since I last saw her which was only a couple of weeks ago. Unfortunately she was not able to get in to get the shoe and potentially the AFO that's gonna be necessary due to her left foot drop. She continues with offloading shoe but this is not enough to prevent her from reopening it appears. When we last had her in the total contact cast she did well from a healing standpoint but unfortunately the wound reopened as soon as she came out of the cast within just a couple of weeks. 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 This is also part of the reason why she falls on a regular basis. I just do not believe that is gonna be safe for [place] to reinitiate the total contact cast as last time we had this on she fell 3 times one week which is definitely not normal for her. 09/18/18 upon evaluation today the patient actually appears to be  doing about the same in regard to her toe ulcer. She did not contact [facility] as I asked her to even though I had given her the prescription. In fact she actually states that she has no idea where the prescription is. She did apparently call [facility] and they told her that all she needed to do was bring the prescription in order to be able to be seen and work on getting the AFO for her left foot. With all that being said she still does not have an appointment and I'm not sure were things stand that regard. I will give her a new prescription today in order to contact them to get this set up. 09/25/18 on evaluation today patient actually appears to be doing about the same in regard to her toes ulcer. She does have a small areas which seems to have a lot of callous buildup around the edge of the wound  which is going to need sharp debridement today. She still is waiting to be scheduled for evaluation with [facility] for possibility of an AFO. She states there supposed to call her tomorrow to get this set up. Unfortunately it does appear that her foot specifically the toe area is showing signs of erythema. There does not appear to be any systemic infection which is in these good news. [name], [name] ([id number]) 10/02/18 on evaluation today patient actually appears to be doing about the same in regard to her toe ulcer. This really has not done too well although it's not significantly larger it's also not significantly smaller. She has been tolerating the dressing changes without complication. She actually has her appointment with [facility] and [place] tomorrow to hopefully be measured for obtaining and AFO splint. I think this would be helpful preventing this from reoccurring. We had contemplated starting the cast this week although to be honest I am reluctant to do that as she's been having nausea, vomiting, and seizure activity over the past three days. She has a history of seizures and have been told is nothing that can be done for these. With that being said I do believe that along with the seizures have the nausea vomiting which upon further questioning doesn't seem to be the normal for her and makes me concerned for the possibility of infection or something else going on. I discussed this with the patient and her mother during the office visit today. I do not feel the wound is effective but maybe something else. The responses this was "this just happens to her at times and we don't know why". They did not seem to be interested in going to the hospital to have this checked out further. 10/09/18 on evaluation today patient presents for follow-up concerning her ongoing toe ulcer. She has been tolerating the dressing changes without complication. Fortunately there does not appear to be any evidence  of infection which is great news however I do think that the patient would benefit from going ahead for with the total contact cast. She's actually in a wheelchair today she tells me that she will use her walker if we initiate the cast. I was very specific about the fact that if we were gonna do the cast I wanted to make sure that she was using the walker in order to prevent any falls. She tells me she does not have stairs that she has to traverse on a regular basis at her home. She has not had any seizures since last week again that something that happens to  her often she tells me she did talk to [employer] and they said that it may take up to three weeks to get the brace approved for her. Hopefully that will not take that long but nonetheless in the meantime I do think the cast could be of benefit. 10/12/18 on evaluation today patient appears to be doing rather well in regard to her toe ulcer. It's just been a few days and already this is significantly improved both as far as overall appearance and size. Fortunately there's no sign of infection. She is here for her first obligatory cast change. 10/19/18 Seen today for follow up and management of left great toe ulcer. Wound continues to show improvement. Noted small open area with seroussang drainage with palpation. Denies any increased pain or recent fevers during visit. She will continue calcium alginate with offloading shoe. Denies any questions or concerns during visit. 10/26/18 on evaluation today patient appears to be doing about the same as when I last saw her in regard to her wound bed. Fortunately there does not appear to be any signs of infection. Unfortunately she continues to have a breakdown in regard to the toe region any time that she is not in the cast. It takes almost no time at all for this to happen. Nonetheless she still has not heard anything from the brace being made by [employer] as to when exactly this will be available to her.  Fortunately there is no signs of infection at this time. 10/30/18 on evaluation today patient presents for application of the total contact cast as we just received him this morning. Fortunately we are gonna be able to apply this to her today which is great news. She continues to have no significant pain which is good news. Overall I do feel like things have been improving while she was the cast is when she doesn't have a cast that things get worse. She still has not really heard anything from [facility] regarding her brace. 11/02/18 upon evaluation today patient's wound already appears to be doing significantly better which is good news. Fortunately there does not appear to be any signs of infection also good news. Overall I do think the total contact cast as before is helping to heal this area unfortunately it's just not gonna likely keep the area closed and healed without her getting her brace at least. Again the foot drop is a significant issue for her. 11/09/18 on evaluation today patient appears to be doing excellent in regard to her toe ulcer which in fact is completely healed. Fortunately we finally got the situation squared away with the paperwork which was needed to proceed with getting her brace approved by Medicaid. I have filled that out unfortunately that information has been sent to the orthopedic office that I worked at 2 1/2 years ago and not tired Current wound care measures. Fortunately she seems to be doing very well at this time. 11/23/18 on evaluation today patient appears to be doing More Poorly Compared to Last Time I Saw Her. At [facility] She Had Completely Healed. Currently she is continuing to have issues with reopening. She states that she just found out that the brace was approved through Medicaid now she just has to go get measured in order to have this fitted for her and then made. Subsequently she does not have an appointment for this yet that is going to complicate things we  obviously cannot put her back in the cast if we do not have everything  measured because they're not gonna be able to measure her foot while she is in the cast. 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 Obviously this is unfortunate and does have me somewhat worried as well. 11/30/18 on evaluation today patient's toe ulcer actually appears to be doing fairly well. The good news is she will be getting her brace in the shoes next week on Wednesday. Hopefully we will be able to get this to heal without having to go back in the cast however she may need the cast in order to get the wound completely heal and then go from there. Fortunately there's no signs of infection at this time. 12/07/18 on evaluation today patient fortunately did receive her brace and she states she could tell this definitely makes her walk better. With that being said she's been having issues with her toe where she noticed yesterday there was a lot of tissue that was loosing off this appears to be much larger than what it was previous. She also states that her leg has been read putting much across the top of her foot just about the ankle although this seems to be receiving somewhat. The total area is still red and appears to be someone infected as best I can tell. She is previously taken Bactrim and that may be a good option for her today as well. We are gonna see what I wound culture shows as well and I think that this is definitely appropriate. With that being said outside of the culture I still need to initiate something in the interim and that's what I'm gonna go ahead and select Bactrim is a good option for her. 12/14/18 on evaluation today patient appears to be doing better in regard to her left great toe ulcer as compared to last week's evaluation. There's still some erythema although this is significantly improved which is excellent  news. Overall I do believe that she is making good progress is still gonna take some time before she is where I would like her to be from the standpoint of being able to place her back into the total contact cast. Hopefully we will be where we need to be by next week. 12/21/18 on evaluation today patient actually appears to be doing poorly in regard to her toe ulcer. She's been tolerating the dressing changes without complication. Fortunately there's no signs of systemic infection although she does have a lot of drainage from the toe ulcer and this does seem to be causing some issues at this point. She does have erythema on the distal portion of her toe that appears to be likely cellulitis. 12/28/18 on evaluation today patient actually appears to be doing a little better in my pinion in regard to her toe ulcer. With that being said she still does have some evidence of infection at this time and for her culture she had both E. coli as well as enterococcus as organisms noted on evaluation. For that reason I think that though the Keflex likely has treated the E. coli rather well this has really done nothing for the enterococcus. We are going to have to initiate treatment for this specifically. [name], [name] ([id number]) 01/04/19 on evaluation today patient's toe actually appears to be doing better from the standpoint of infection. She currently would like to see about putting the cash back on I think that this is appropriate as  long as she takes care of it and keeps it from getting wet. She is gonna have some drainage we can definitely pass this up with Drawtex and alginate to try to prevent as much drainage as possible from causing the problems. With that being said I do want to at least try her with the cast between now and Tuesday. If there any issues we can't continue to use it then I will discontinue the use of the cast at that point. 01/08/19 on evaluation today patient actually appears to be doing  very well as far as her foot ulcer specifically the great toe on the left is concerned. She did have an area of rubbing on the medial aspect of her left ankle which again is from the cast. Fortunately there's no signs of infection at this point in this appears to be a very slight skin breakdown. The patient tells me she felt it rubbing but didn't think it was that bad. Fortunately there is no signs of active infection at this time which is good news. No fevers, chills, nausea, or vomiting noted at this time. 01/15/19 on evaluation today patient actually appears to be doing well in regard to her toe ulcer. Again as previous she seems to do well and she has the cast on which indicates to me that during the time she doesn't have a cast on she's putting way too much pressure on this region. Obviously I think that's gonna be an issue as with the current national emergency concerning the Covid-19 Virus it has been recommended that we discontinue the use of total contact casting by the chief medical officer of our company, Dr. Simona Huh. The reasoning is that if a patient becomes sick and cannot come into have the cast removed they could not just leave this on for an additional two weeks. Obviously the hospitals also do not want to receive patient's who are sick into the emergency department to potentially contaminate the region and spread the Covid-19 Virus among other sick individuals within the hospital system. Therefore at this point we are suspending the use of total contact cast until the current emergency subsides. This was all discussed with the patient today as well. 01/22/19 on evaluation today patient's wound on her left great toe appears to be doing slightly worse than previously noted last week. She tells me that she has been on this quite a bit in fact she tells me she's been awake for 38 straight hours. This is due to the fact that she's having to care for grandparents because nobody else will. She  has been taking care of them for five the last seven days since I've seen her they both have dementia his is from a stroke and her grandmother's was progressive. Nonetheless she states even her mom who knows her condition and situation has only help two of those days to take care of them she's been taking care of the rest. Fortunately there does not appear to be any signs of active infection in regard to her toe at this point although obviously it doesn't look as good as it did previous. I think this is directly related to her not taking off the pressure and friction by way of taking things easy. Though I completely understand what's going on. 01/29/19 on evaluation today patient's tools are actually appears to be showing some signs of improvement today compared to last week's evaluation as far as not necessarily the overall size of the wound but the fact that  she has some new skin growth in between the two ends of the wound opening. Overall I feel like she has done well she states that she had a family member give her what sounds to be a CAM walker boot which has been helpful as well. 02/05/19 on evaluation today patient's wound bed actually appears to be doing significantly better in regard to her overall appearance of the size of the wound. With that being said she is still having an issue with offloading efficiently enough to get this to close. Apparently there is some signs of infection at this point as well unfortunately. Previously she's done well of Augmentin I really do not see anything that needs to be culture currently but there theme and cellulitis of the foot that I'm seeing I'm gonna go ahead and place her on an antibiotic today to try to help clear this up. 02/12/2019 on evaluation today patient actually appears to be doing poorly in regard to her overall wound status. She tells me she has been using her offloading shoe but actually comes in today wearing her tennis shoe with the AFO brace.  Again as I previously discussed with her this is really not sufficient to allow the area to heal appropriately. Nonetheless she continues to be somewhat noncompliant and I do wonder based on what she has told my nurse in the past as to whether or not a good portion of this noncompliance may be recreational drug and alcohol related. She has had a history of heroin overdose and this was fairly recently in the past couple of months that have been seeing her. Nonetheless overall I feel like her wound looks significantly worse today compared to what it was previous. She still has significant erythema despite the Augmentin I am not sure that this is an appropriate medication for her infection I am also concerned that the infection may have gone down into her bone. 02/19/19 on evaluation today patient actually appears to be doing about the same in regard to her toe ulcer. Unfortunately she continues to show signs of bone exposure and infection at this point. There does not appear to be any evidence of worsening of the infection but I'm also not really sure that it's getting significantly better. She is on the Augmentin which should be sufficient for the Staphylococcus aureus infection that she has at this point. With that being said she may need IV antibiotics to more appropriately treat this. We did have a discussion today about hyperbaric option therapy. 02/28/19 on evaluation today patient actually appears to be doing much worse in regard to the wound on her left great toe as compared to even my previous evaluation last week. Unfortunately this seems to be training in a pretty poor direction. Her toe was actually now starting to angle laterally and I can actually see the entire joint area of the proximal portion of the digit where is the distal portion of the digit again is no longer even in contact with the joint line. Unfortunately there's a lot more necrotic tissue around the edge and the toe appears to be  showing signs of becoming gangrenous in my pinion. I'm very concerned about were things stand at this point. She did see infectious disease and they are planning to send in a prescription for Sivextro for her and apparently this has been approved. With that being said I don't think she should avoid taking this but at the same time I'm not sure that it's gonna be sufficient  to save her toe at this point. 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 03/05/19 on evaluation today patient actually appears to be doing quite well in regard to her toe all things considering. She still has bone exposed but there appears to be much less your thing on overall the appearance of the wound and the toe itself is dramatically improved. She still does have some issues currently obviously with infection she did see vascular as well and there concerned that her blood flow to the toad. For that reason they are setting up for an angiogram next week. 03/14/19 on evaluation today patient appears to be doing very poor in regard to her toe and specifically in regard to the ulceration and the fact that she's starting to notice the toe was leaning even more towards the lateral aspect and the complete joint is visible on the proximal aspect of the joint. Nonetheless she's also noted a significant odor and the tip of the toe is turning more dark and necrotic appearing. Overall I think she is getting worse not better as far as this is concerned. For that reason I am recommending at this point that she likely needs to be seen for likely amputation. READMISSION 03/19/2021 This is a patient that we cared for in this clinic for a prolonged period of time in 2019 and 2020 with a left foot and left first toe wound. I believe she  ultimately became infected and underwent a left first toe amputation. 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 ([id number]) 04/09/20 by Dr. Luana Shu. In December 2021 she had an ulcer on her right great toe as well as the fourth and fifth toes. 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 She also had an angiogram at that time and underwent angioplasty of the right anterior tibial artery. In any case she claims that the wound on the right foot is closed I did not look at this today which was probably an oversight although I think that should be done next week. After her surgery she developed a dehiscence but I do not see any follow-up of this. According to Dr. Deborra Medina last review that she was out of the area being cared for by another physician but recently came back to his attention. 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 A culture of this area showed E. coli apparently before she came back to see Dr. Luana Shu she was supposed to be receiving antibiotics but she did not really take them. Nor is she offloading this area at all. Finally her last hemoglobin A1c listed in epic was in March 2022 at 14.1 she says things are a lot better since then although I am not sure. 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 She had this wound at the time. During this admission she had E. coli sepsis a TEE was negative. 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 She is still  a heavy cigarette smoker. She has a history of bipolar disorder seizure disorder ABI in our clinic was 1.05 6/1; left midfoot  in the setting of a TMA done previously. Round circular wound with a "knuckle" of protruding tissue. The problem is that the knuckle was not attached to any of the surrounding granulation and this probed proximally widely I removed a large portion of this tissue. This wound goes with considerable undermining laterally. I do not feel any bone there was no purulence but this is a deep wound. 6/8; in spite of the debridement I did last week. She arrives with a wound looking exactly the same. A protruding "knuckle" of tissue nonadherent to most of the surrounding tissue. There is considerable depth around this from 6-12 o'clock at 2.7 cm and undermining of 1 cm. This does not look overtly infected and the x-ray I did last week was negative for any osseous abnormalities. We have been using silver collagen 6/15; deep tissue culture I did last week showed moderate staph aureus and moderate Pseudomonas. This will definitely require prolonged antibiotic therapy. The pathology on the protuberant area was negative for malignancy fungus etc. 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 We have been using silver collagen. I am going to be prescribing Levaquin for 2 weeks. Her CT scan of the foot is down for 7/5 6/22; CT scan of the foot on 7 5. She says she has hardware in the left leg from her previous fracture. She is on the Levaquin for the deep tissue culture I did that showed methicillin sensitive staph aureus and Pseudomonas. I gave her a 2-week supply and she will have another week. She arrives in clinic today with the same protuberant tissue however this is nonadherent to the tissue surrounding it. I am really at a loss to explain this unless there is underlying deep tissue infection 6/29; patient presents for 1 week follow-up. She has been using collagen to the wound bed. She reports taking her antibiotics as prescribed.She has no complaints or  issues today. She denies signs of infection. 7/6; patient presents for one week followup. She has been using collagen to the wound bed. She states she is taking Levaquin however at times she is not able to keep it down. She denies signs of infection. 7/13; patient presents for 1 week follow-up. She has been using silver alginate to the wound bed. She still has nausea when taking her antibiotics. She denies signs of infection. 7/20; patient presents for 1 week follow-up. She has been using silver alginate with gentamicin cream to the wound bed. She denies any issues and has no complaints today. She denies signs of infection. 7/27; patient presents for 1 week follow-up. She continues to use silver alginate with gentamicin cream to the wound bed. She reports starting her antibiotics. She has no issues or complaints. Overall she reports stability to the wound. 8/3; patient presents for 1 week follow-up. She has been using silver alginate with gentamicin cream to the wound bed. She reports completing all antibiotics. She has no issues or complaints today. She denies signs of infection. 8/17; patient presents for 2-week follow-up. He is to use silver alginate to the wound bed. She has no issues or complaints today. She denies signs of infection. She reports her pain has improved in her foot since last clinic visit 8/24; patient presents for 1 week follow-up. She continues to use silver alginate to the wound bed. She has no  issues or complaints. She denies signs of infection. Pain is stable. 9/7; patient presents for follow-up. She missed her last week appointment due to feeling ill. She continues to use silver alginate. She has a new wound to the right lower extremity that is covered in eschar. She states It occurred over the past week and has no idea how it started. She currently denies signs of infection. 9/14; patient presents for follow-up. To the left foot wound she has been using gentamicin cream  and silver alginate. To the right lower extremity wound she has been keeping this covered and has not obtain Santyl. 9/21; patient presents for follow-up. She reports using gentamicin cream and silver alginate to the left foot and Santyl to the right lower extremity wound. She has no issues or complaints today. She denies signs of infection. 9/28; patient presents for follow-up. She reports a new wound to her right heel. She states this occurred a few days ago and is progressively gotten worse. She has been trying to clean the area with a Q-tip and Santyl. She reports stability in the other 2 wounds. She has been using gentamicin cream and silver alginate to the left foot and Santyl to the right lower extremity wound. Electronic Signature(s) Signed: 07/21/2021 12:31:15 PM By: Kalman Shan DO Entered By: Kalman Shan on 07/21/2021 11:53:57 [name] ([id number]) -------------------------------------------------------------------------------- Physical Exam Details Patient Name: Heather Dear A. Date of Service: 07/21/2021 10:45 AM Medical Record Number: [id number] Patient Account Number: [id number] Date of Birth/Sex: [birth year] (45 y.o. F) Treating RN: Dolan Amen Primary Care Provider: Raelene Bott Other Clinician: Referring Provider: Raelene Bott Treating Provider/Extender: Yaakov Guthrie in Treatment: 17 Constitutional . Psychiatric . Notes Left foot (Transmetatarsal amputation): Wound to the plantar aspect that that has granulation tissue and nonviable tissue present. Right lower extremity: To the anterior aspect there is an open wound with slough and nonviable tissue throughout. Right foot: To the heel there is an open wound with nonviable tissue and circumferential undermining. Slight erythema and increased warmth to this periwound. Electronic Signature(s) Signed: 07/21/2021 12:31:15 PM By: Kalman Shan DO Entered By: Kalman Shan on 07/21/2021  11:55:05 [name] ([id number]) -------------------------------------------------------------------------------- Physician Orders Details Patient Name: Heather Dear A. Date of Service: 07/21/2021 10:45 AM Medical Record Number: [id number] Patient Account Number: [id number] Date of Birth/Sex: January 23, 1976 (45 y.o. F) Treating RN: Dolan Amen Primary Care Provider: Raelene Bott Other Clinician: Referring Provider: Raelene Bott Treating Provider/Extender: Yaakov Guthrie in Treatment: 55 Verbal / Phone Orders: No Diagnosis Coding Follow-up Appointments o Return Appointment in 1 week. Bathing/ Shower/ Hygiene o Clean wound with Normal Saline or wound cleanser. Off-Loading Left Lower Extremity o Open toe surgical shoe with peg assist. o Turn and reposition every 2 hours Medications-Please add to medication list. o P.O. Antibiotics Wound Treatment Wound #7 - Foot Wound Laterality: Plantar, Left, Midline Cleanser: Soap and Water 1 x Per Day/30 Days Discharge Instructions: Gently cleanse wound with antibacterial soap, rinse and pat dry prior to dressing wounds Topical: Gentamicin 1 x Per Day/30 Days Discharge Instructions: Apply to wound bed and tunnel area under the dressing Topical: Santyl Collagenase Ointment, 30 (gm), tube 1 x Per Day/30 Days Discharge Instructions: Apply nickel thick to wound bed IN OFFICE ONLY Primary Dressing: Silvercel Small 2x2 (in/in) 1 x Per Day/30 Days Discharge Instructions: Apply to wound bed Secondary Dressing: ABD Pad 5x9 (in/in) 1 x Per Day/30 Days Discharge Instructions: Cover with ABD pad on top of dressing Secondary Dressing: Gauze 1 x  Per Day/30 Days Discharge Instructions: To hold dressing in place Secured With: Kerlix Roll Sterile or Non-Sterile 6-ply 4.5x4 (yd/yd) 1 x Per Day/30 Days Discharge Instructions: Apply Kerlix as directed Wound #8 - Lower Leg Wound Laterality: Right, Anterior Cleanser: Normal Saline 1 x  Per Day/30 Days Discharge Instructions: Wash your hands with soap and water. Remove old dressing, discard into plastic bag and place into trash. Cleanse the wound with Normal Saline prior to applying a clean dressing using gauze sponges, not tissues or cotton balls. Do not scrub or use excessive force. Pat dry using gauze sponges, not tissue or cotton balls. Topical: Santyl Collagenase Ointment, 30 (gm), tube 1 x Per Day/30 Days Discharge Instructions: apply nickel thick to wound bed only Secondary Dressing: [place] Dressing, 4x4 (in/in) 1 x Per Day/30 Days Discharge Instructions: Apply over dressing to secure in place. Wound #9 - Calcaneus Wound Laterality: Right Cleanser: Soap and Water 1 x Per Day/30 Days Discharge Instructions: Gently cleanse wound with antibacterial soap, rinse and pat dry prior to dressing wounds Primary Dressing: Silvercel Small 2x2 (in/in) 1 x Per Day/30 Days Lorenzi, Zayanna A. ([id number]) Discharge Instructions: Apply to wound bed Secondary Dressing: ABD Pad 5x9 (in/in) 1 x Per Day/30 Days Discharge Instructions: Cover with ABD pad on top of dressing Secondary Dressing: Gauze 1 x Per Day/30 Days Discharge Instructions: To hold dressing in place Secured With: Kerlix Roll Sterile or Non-Sterile 6-ply 4.5x4 (yd/yd) 1 x Per Day/30 Days Discharge Instructions: Apply Kerlix as directed Radiology o X-ray, foot - Right foot Patient Medications Allergies: erythromycin base Notifications Medication Indication Start End clindamycin HCl 07/21/2021 DOSE 1 - oral 300 mg capsule - 1 capsule oral QID x 7 days Electronic Signature(s) Signed: 07/21/2021 12:08:17 PM By: Kalman Shan DO Entered By: Kalman Shan on 07/21/2021 12:08:17 [name] ([id number]) -------------------------------------------------------------------------------- Problem List Details Patient Name: Heather Dear A. Date of Service: 07/21/2021 10:45 AM Medical Record Number:  [id number] Patient Account Number: [id number] Date of Birth/Sex: November 26, 1975 (45 y.o. 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 03/19/2021 No Yes severity S81.801D Unspecified open wound, right lower leg, subsequent encounter 07/07/2021 No Yes E11.621 Type 2 diabetes mellitus with foot ulcer 03/19/2021 No Yes E11.42 Type 2 diabetes mellitus with diabetic polyneuropathy 03/19/2021 No Yes S91.301A Unspecified open wound, right foot, initial encounter 07/21/2021 No Yes Inactive Problems ICD-10 Code Description Active Date Inactive Date S81.801A Unspecified open wound, right lower leg, initial encounter 06/30/2021 06/30/2021 Resolved Problems Electronic Signature(s) Signed: 07/21/2021 12:31:15 PM By: Kalman Shan DO Entered By: Kalman Shan on 07/21/2021 11:51:48 [name], [name] ([id number]) -------------------------------------------------------------------------------- Progress Note Details Patient Name: Heather Dear A. Date of Service: 07/21/2021 10:45 AM Medical Record Number: [id number] Patient Account Number: [id number] Date of Birth/Sex: 29-Oct-1975 (45 y.o. F) Treating RN: Dolan Amen Primary Care Provider: Raelene Bott Other Clinician: Referring Provider: Raelene Bott Treating Provider/Extender: Yaakov Guthrie in Treatment: 17 Subjective Chief Complaint Information obtained from Patient 03/19/2021; patient referred by Dr. Luana Shu who has been looking after her left foot for quite a period of time for review of a nonhealing area in the left midfoot History of Present Illness (HPI) 01/18/18-She is here for initial evaluation of the left great toe ulcer. She is a  poor historian in regards to  timeframe in detail. She states approximately 4 weeks ago she lacerated her toe on something in the house. She followed up with her primary care who placed her on Bactrim and ultimately a second dose of Bactrim prior to coming to wound clinic. She states she has been treating the toe with peroxide, Betadine and a Band-Aid. She did not check her blood sugar this morning but checked it yesterday morning it was 327; she is unaware of a recent A1c and there are no current records. She saw Dr. she would've orthopedics last week for an old injury to the left ankle, she states he did not see her toe, nor did she bring it to his attention. She smokes approximately 1 pack cigarettes a day. Her social situation is concerning, she arrives this morning with her mother who appears extremely intoxicated/under the influence; her mother was asked to leave the room and be monitored by the patient's grandmother. The patient's aunt then accompanied the patient and the room throughout the rest of the appointment. We had a lengthy discussion regarding the deleterious effects of uncontrolled hyperglycemia and smoking as it relates to wound healing and overall health. She was strongly encouraged to decrease her smoking and get her diabetes under better control. She states she is currently on a diet and has cut down her [facility] consumption. The left toe is erythematous, macerated and slightly edematous with malodor present. The edema in her left foot is below her baseline, there is no erythema streaking. We will treat her with Santyl, doxycycline; we have ordered and xray, culture and provided a Peg assist surgical shoe and cultured the wound. 01/25/18-She is here in follow-up evaluation for a left great toe ulcer and presents with an abscess to her suprapubic area. She states her blood sugars remain elevated, feeling "sick" and if levels are below 250, but she is trying. She has made no  attempt to decrease her smoking stating that we "can't take away her food in her cigarettes". She has been compliant with offloading using the PEG assist you. She is using Santyl daily. the culture obtained last week grew staph aureus and Enterococcus faecalis; continues on the doxycycline and Augmentin was added on Monday. The suprapubic area has erythema, no femoral variation, purple discoloration, minimal induration, was accessed with a cotton tip applicator with sanguinopurulent drainage, this was cultured, I suspect the current antibiotic treatment will cover and we will not add anything to her current treatment plan. She was advised to go to urgent care or ER with any change in redness, induration or fever. 02/01/18-She is here in follow-up evaluation for left great toe ulcers and a new abdominal abscess from last week. She was able to use packing until earlier this week, where she "forgot it was there". She states she was feeling ill with GI symptoms last week and was not taking her antibiotic. She states her glucose levels have been predominantly less than 200, with occasional levels between 200-250. She thinks this was contributing to her GI symptoms as they have resolved without intervention. There continues to be significant laceration to left toe, otherwise it clinically looks stable/improved. There is now less superficial opening to the lateral aspect of the great toe that was residual blister. We will transition to [facility] to all wounds, she will continue her Augmentin. If there is no change or deterioration next week for reculture. 02/08/18-She is here in follow-up evaluation for left great toe ulcer and abdominal ulcer. There is an improvement in  both wounds. She has been wrapping her left toe with coban, not by our direction, which has created an area of discoloration to the medial aspect; she has been advised to NOT use coban secondary to her neuropathy. She states her glucose  levels have been high over this last week ranging from 200-350, she continues to smoke. She admits to being less compliant with her offloading shoe. We will continue with same treatment plan and she will follow-up next week. 02/15/18-She is here in follow-up evaluation for left great toe ulcer and abdominal ulcer. The abdominal ulcer is epithelialized. The left great toe ulcer is improved and all injury from last week using the Coban wrap is resolved, the lateral ulcer is healed. She admits to noncompliance with wearing offloading shoe and admits to glucose levels being greater than 300 most of the week. She continues to smoke and expresses no desire to quit. There is one area medially that probes deeper than it has historically, erythema to the toe and dorsal foot has consistently waxed and waned. There is no overt signs of cellulitis or infection but we will culture the wound for any occult infection given the new area of depth and erythema. We will hold off on sensitivities for initiation of antibiotic therapy. 02/22/18-She is here in follow up evaluation for left great toe ulcer. There is overall significant improvement in both wound appearance, erythema and edema with changes made last week. She was not initiated on antibiotic therapy. Culture obtained last week showed oxacillin sensitive staph aureus, sensitive to clindamycin. Clindamycin has been called into the pharmacy but she has been instructed to hold off on initiation secondary to overall clinical improvement and her history of antibiotic intolerance. She has been instructed to contact the clinic with any noted changes/deterioration and the wound, erythema, edema and/or pain. She will follow-up next week. She continues to smoke and her glucose levels remain elevated >250; she admits to compliance with offloading shoe 03/01/18 on evaluation today patient appears to be doing fairly well in regard to her left first toe ulcer. She has been  tolerating the dressing changes with the [facility] Dressing without complication and overall this has definitely showed signs of improvement according to records as well is what the patient tells me today. I'm very pleased in that regard. She is having no pain today 03/08/18 She is here for follow up evaluation of a left great toe ulcer. She remains non-compliant with glucose control and smoking cessation; glucose levels consistently >200. She states that she got new shoe inserts/peg assist. She admits to compliance with offloading. Since my last evaluation there is significant improvement. We will switch to prisma at this time and she will follow up next week. She is noted to be tachycardic at this appointment, heart rate 120s; she has a history of heart rate 70-130 according to our records. She admits to extreme agitation r/t personal issues; she was advised to monitor her heartrate and contact her physician if it does not return to a more normal range (<100). She takes cardizem twice daily. 03/15/18-She is here in follow-up evaluation for left great toe ulcer. She remains noncompliant with glucose control and smoking cessation. She admits to compliance with wearing offloading shoe. The ulcer is improved/stable and we will continue with the same treatment plan and she will follow-up next week 03/22/18-She is here for evaluation for left great toe ulcer. There continues to be significant improvement despite recurrent hyperglycemia (over 500 yesterday) and she continues to smoke.  She has been compliant with offloading and we will continue with same treatment plan and she will follow-up next week. [place], Saira A. ([id number]) 03/29/18-She is here for evaluation for left great toe ulcer. Despite continuing to smoke and uncontrolled diabetes she continues to improve. She is compliant with offloading shoe. We will continue with the same treatment plan and she will follow-up next week 04/05/18- She is  here in follow up evaluation for a left great toe ulcer; she presents with small pustule to left fifth toe (resembles ant bite). She admits to compliance with wearing offloading shoe; continues to smoke or have uncontrolled blood glucose control. There is more callus than usual with evidence of bleeding; she denies known trauma. 04/12/18-She is here for evaluation of left great toe ulcer. Despite noncompliance with glycemic control and smoking she continues to make improvement. She continues to wear offloading shoe. The pustule, that was identified last week, to the left fifth toe is resolved. She will follow-up in 2 weeks 05/03/18-she is seen in follow-up evaluation for a left great toe ulcer. She is compliant with offloading, otherwise noncompliant with glycemic control and smoking. She has plateaued and there is minimal improvement noted. We will transition to [facility], replaced the insert to her surgical shoe and she will follow-up in one week 05/10/18- She is here in follow up evaluation for a left great toe ulcer. It appears stable despite measurement change. We will continue with same treatment plan and follow up next week. 05/24/18-She is seen in follow-up evaluation for a left great toe ulcer. She remains compliant with offloading, has made significant improvement in her diet, decreasing the amount of sugar/soda. She said her recent A1c was 10.9 which is lower than. She did see a diabetic nutritionist/educator yesterday. She continues to smoke. We will continue with the same treatment plan and she'll follow-up next week. 05/31/18- She is seen in follow-up evaluation for left great toe ulcer. She continues to remain compliant with offloading, continues to make improvement in her diet, increasing her water and decreasing the amount of sugar/soda. She does continue to smoke with no desire to quit. We will apply Prisma to the depth and Hydrofera Blue over. We have not received insurance  authorization for [facility]. She will follow up next week. 06/07/18-She is seen in follow-up evaluation for left great toe ulcer. It has stalled according to today's measurements although base appears stable. She says she saw a diabetic educator yesterday; her average blood sugars are less than 300 which is an improvement for her. She continues to smoke and states "that's my next step" She continues with water over soda. We will order for xray, culture and reinstate ace wrap compression prior to placing apligraf for next week. She is voicing no complaints or concerns. Her dressing will change to iodoflex over the next week in preparation for apligraf. 06/14/18-She is seen in follow-up evaluation for left great toe ulcer. Plain film x-ray performed last week was negative for osteomyelitis. Wound culture obtained last week grew strep B and OSSA; she is initiated on keflex and cefdinir today; there is erythema to the toe which could be from ace wrap compression, she has a history of wrapping too tight and has has been encouraged to maintain ace wraps that we place today. We will hold off on application of apligraf today, will apply next week after antibiotic therapy has been initiated. She admits today that she has resumed taking a shower with her foot/toe submerged in water, she has  been reminded to keep foot/toe out of the bath water. She will be seen in follow up next week 06/21/18-she is seen in follow-up evaluation for left great toe ulcer. She is tolerating antibiotic therapy with no GI disturbance. The wound is stable. Apligraf was applied today. She has been decreasing her smoking, only had 4 cigarettes yesterday and 1 today. She continues being more compliant in diabetic diet. She will follow-up next week for evaluation of site, if stable will remove at 2 weeks. 06/28/18- She is here in follow up evalution. Apligraf was placed last week, she states the dressing fell off on Tuesday and she was dressing  with hydrofera blue. She is healed and will be discharged from the clinic today. She has been instructed to continue with smoking cessation, continue monitoring glucose levels, offloading for an additional 4 weeks and continue with hydrofera blue for additional two weeks for any possible microscopic opening. Readmission: 08/07/18 on evaluation today patient presents for reevaluation concerning the ulcer of her right great toe. She was previously discharged on 06/28/18 healed. Nonetheless she states that this began to show signs of drainage she subsequently went to her primary care provider. Subsequently an x-ray was performed on 08/01/18 which was negative. The patient was also placed on antibiotics at that time. Fortunately they should have been effective for the infection. Nonetheless she's been experiencing some improvement but still has a lot of drainage coming from the wound itself. 08/14/18 on evaluation today patient's wound actually does show signs of improvement in regard to the erythema at this point. She has completed the antibiotics. With that being said we did discuss the possibility of placing her in a total contact cast as of today although I think that I may want to give this just a little bit more time to ensure nothing recurrence as far as her infection is concerned. I do not want to put in the cast and risk infection at that time if things are not completely resolved. With that being said she is gonna require some debridement today. 08/21/18 on evaluation today patient actually appears to be doing okay in regard to her toe ulcer. She's been tolerating the dressing changes without complication. With that being said it does appear that she is ready and in fact I think it's appropriate for [place] to go ahead and initiate the total contact cast today. Nonetheless she will require some sharp debridement to prepare the wound for application. Overall I feel like things have been progressing  well but we do need to do something to get this to close more readily. 08/24/18 patient seen today for reevaluation after having had the total contact cast applied on Tuesday. She seems to have done very well the wound appears to be doing great and overall I'm pleased with the progress that she's made. There were no abnormal areas of rubbing from the cast on her lower extremity. 08/30/18 on evaluation today patient actually appears to be completely healed in regard to her plantar toe ulcer. She tells me at this point she's been having a lot of issues with the cast. She almost fell a couple of times the state shall the step of her dog a couple times as well. This is been a very frustrating process for her other nonetheless she has completely healed the wound which is excellent news. Overall there does not appear to be the evidence of infection at this time which is great news. 09/11/18 evaluation today patient presents for follow-up concerning her  great toe ulcer on the left which has unfortunately reopened since I last saw her which was only a couple of weeks ago. Unfortunately she was not able to get in to get the shoe and potentially the AFO that's gonna be necessary due to her left foot drop. She continues with offloading shoe but this is not enough to prevent her from reopening it appears. When we last had her in the total contact cast she did well from a healing standpoint but unfortunately the wound reopened as soon as she came out of the cast within just a couple of weeks. 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 This is also part of the reason why she falls on a regular basis. I just do not believe that is gonna be safe for [place] to reinitiate the total contact cast as last time we had this on she  fell 3 times one week which is definitely not normal for her. 09/18/18 upon evaluation today the patient actually appears to be doing about the same in regard to her toe ulcer. She did not contact [facility] as I asked her to even though I had given her the prescription. In fact she actually states that she has no idea where the prescription is. She did apparently call [facility] and they told her that all she needed to do was bring the prescription in order to be able to be seen and work on getting the AFO for her left foot. With all that being said she still does not have an appointment and I'm not sure were things stand that regard. I will give her a new prescription today in order to contact them to get this set up. [name], [name] ([id number]) 09/25/18 on evaluation today patient actually appears to be doing about the same in regard to her toes ulcer. She does have a small areas which seems to have a lot of callous buildup around the edge of the wound which is going to need sharp debridement today. She still is waiting to be scheduled for evaluation with [facility] for possibility of an AFO. She states there supposed to call her tomorrow to get this set up. Unfortunately it does appear that her foot specifically the toe area is showing signs of erythema. There does not appear to be any systemic infection which is in these good news. 10/02/18 on evaluation today patient actually appears to be doing about the same in regard to her toe ulcer. This really has not done too well although it's not significantly larger it's also not significantly smaller. She has been tolerating the dressing changes without complication. She actually has her appointment with [facility] and [facility] tomorrow to hopefully be measured for obtaining and AFO splint. I think this would be helpful preventing this from reoccurring. We had contemplated starting the cast this week although to be honest I am reluctant to do that as she's been  having nausea, vomiting, and seizure activity over the past three days. She has a history of seizures and have been told is nothing that can be done for these. With that being said I do believe that along with the seizures have the nausea vomiting which upon further questioning doesn't seem to be the normal for her and makes me concerned for  the possibility of infection or something else going on. I discussed this with the patient and her mother during the office visit today. I do not feel the wound is effective but maybe something else. The responses this was "this just happens to her at times and we don't know why". They did not seem to be interested in going to the hospital to have this checked out further. 10/09/18 on evaluation today patient presents for follow-up concerning her ongoing toe ulcer. She has been tolerating the dressing changes without complication. Fortunately there does not appear to be any evidence of infection which is great news however I do think that the patient would benefit from going ahead for with the total contact cast. She's actually in a wheelchair today she tells me that she will use her walker if we initiate the cast. I was very specific about the fact that if we were gonna do the cast I wanted to make sure that she was using the walker in order to prevent any falls. She tells me she does not have stairs that she has to traverse on a regular basis at her home. She has not had any seizures since last week again that something that happens to her often she tells me she did talk to [employer] and they said that it may take up to three weeks to get the brace approved for her. Hopefully that will not take that long but nonetheless in the meantime I do think the cast could be of benefit. 10/12/18 on evaluation today patient appears to be doing rather well in regard to her toe ulcer. It's just been a few days and already this is significantly improved both as far as overall  appearance and size. Fortunately there's no sign of infection. She is here for her first obligatory cast change. 10/19/18 Seen today for follow up and management of left great toe ulcer. Wound continues to show improvement. Noted small open area with seroussang drainage with palpation. Denies any increased pain or recent fevers during visit. She will continue calcium alginate with offloading shoe. Denies any questions or concerns during visit. 10/26/18 on evaluation today patient appears to be doing about the same as when I last saw her in regard to her wound bed. Fortunately there does not appear to be any signs of infection. Unfortunately she continues to have a breakdown in regard to the toe region any time that she is not in the cast. It takes almost no time at all for this to happen. Nonetheless she still has not heard anything from the brace being made by [employer] as to when exactly this will be available to her. Fortunately there is no signs of infection at this time. 10/30/18 on evaluation today patient presents for application of the total contact cast as we just received him this morning. Fortunately we are gonna be able to apply this to her today which is great news. She continues to have no significant pain which is good news. Overall I do feel like things have been improving while she was the cast is when she doesn't have a cast that things get worse. She still has not really heard anything from [place] regarding her brace. 11/02/18 upon evaluation today patient's wound already appears to be doing significantly better which is good news. Fortunately there does not appear to be any signs of infection also good news. Overall I do think the total contact cast as before is helping to heal this area unfortunately it's just  not gonna likely keep the area closed and healed without her getting her brace at least. Again the foot drop is a significant issue for her. 11/09/18 on evaluation today patient  appears to be doing excellent in regard to her toe ulcer which in fact is completely healed. Fortunately we finally got the situation squared away with the paperwork which was needed to proceed with getting her brace approved by Medicaid. I have filled that out unfortunately that information has been sent to the orthopedic office that I worked at 2 1/2 years ago and not tired Current wound care measures. Fortunately she seems to be doing very well at this time. 11/23/18 on evaluation today patient appears to be doing More Poorly Compared to Last Time I Saw Her. At [facility] She Had Completely Healed. Currently she is continuing to have issues with reopening. She states that she just found out that the brace was approved through Medicaid now she just has to go get measured in order to have this fitted for her and then made. Subsequently she does not have an appointment for this yet that is going to complicate things we obviously cannot put her back in the cast if we do not have everything measured because they're not gonna be able to measure her foot while she is in the cast. 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 Obviously this is unfortunate and does have me somewhat worried as well. 11/30/18 on evaluation today patient's toe ulcer actually appears to be doing fairly well. The good news is she will be getting her brace in the shoes next week on Wednesday. Hopefully we will be able to get this to heal without having to go back in the cast however she may need the cast in order to get the wound completely heal and then go from there. Fortunately there's no signs of infection at this time. 12/07/18 on evaluation today patient fortunately did receive her brace and she states she could tell this definitely makes her walk better. With that being said she's been having issues with her toe where she noticed  yesterday there was a lot of tissue that was loosing off this appears to be much larger than what it was previous. She also states that her leg has been read putting much across the top of her foot just about the ankle although this seems to be receiving somewhat. The total area is still red and appears to be someone infected as best I can tell. She is previously taken Bactrim and that may be a good option for her today as well. We are gonna see what I wound culture shows as well and I think that this is definitely appropriate. With that being said outside of the culture I still need to initiate something in the interim and that's what I'm gonna go ahead and select Bactrim is a good option for her. 12/14/18 on evaluation today patient appears to be doing better in regard to her left great toe ulcer as compared to last week's evaluation. There's still some erythema although this is significantly improved which is excellent news. Overall I do believe that she is making good progress is still gonna take some time before she is where I would like her to be from the standpoint of being able to place her back into the total contact cast. Hopefully we will be where  we need to be by next week. 12/21/18 on evaluation today patient actually appears to be doing poorly in regard to her toe ulcer. She's been tolerating the dressing changes without complication. Fortunately there's no signs of systemic infection although she does have a lot of drainage from the toe ulcer and this does [name], [name] ([id number]) seem to be causing some issues at this point. She does have erythema on the distal portion of her toe that appears to be likely cellulitis. 12/28/18 on evaluation today patient actually appears to be doing a little better in my pinion in regard to her toe ulcer. With that being said she still does have some evidence of infection at this time and for her culture she had both E. coli as well as enterococcus as  organisms noted on evaluation. For that reason I think that though the Keflex likely has treated the E. coli rather well this has really done nothing for the enterococcus. We are going to have to initiate treatment for this specifically. 01/04/19 on evaluation today patient's toe actually appears to be doing better from the standpoint of infection. She currently would like to see about putting the cash back on I think that this is appropriate as long as she takes care of it and keeps it from getting wet. She is gonna have some drainage we can definitely pass this up with Drawtex and alginate to try to prevent as much drainage as possible from causing the problems. With that being said I do want to at least try her with the cast between now and Tuesday. If there any issues we can't continue to use it then I will discontinue the use of the cast at that point. 01/08/19 on evaluation today patient actually appears to be doing very well as far as her foot ulcer specifically the great toe on the left is concerned. She did have an area of rubbing on the medial aspect of her left ankle which again is from the cast. Fortunately there's no signs of infection at this point in this appears to be a very slight skin breakdown. The patient tells me she felt it rubbing but didn't think it was that bad. Fortunately there is no signs of active infection at this time which is good news. No fevers, chills, nausea, or vomiting noted at this time. 01/15/19 on evaluation today patient actually appears to be doing well in regard to her toe ulcer. Again as previous she seems to do well and she has the cast on which indicates to me that during the time she doesn't have a cast on she's putting way too much pressure on this region. Obviously I think that's gonna be an issue as with the current national emergency concerning the Covid-19 Virus it has been recommended that we discontinue the use of total contact casting by the chief  medical officer of our company, Dr. Simona Huh. The reasoning is that if a patient becomes sick and cannot come into have the cast removed they could not just leave this on for an additional two weeks. Obviously the hospitals also do not want to receive patient's who are sick into the emergency department to potentially contaminate the region and spread the Covid-19 Virus among other sick individuals within the hospital system. Therefore at this point we are suspending the use of total contact cast until the current emergency subsides. This was all discussed with the patient today as well. 01/22/19 on evaluation today patient's wound on her left great  toe appears to be doing slightly worse than previously noted last week. She tells me that she has been on this quite a bit in fact she tells me she's been awake for 38 straight hours. This is due to the fact that she's having to care for grandparents because nobody else will. She has been taking care of them for five the last seven days since I've seen her they both have dementia his is from a stroke and her grandmother's was progressive. Nonetheless she states even her mom who knows her condition and situation has only help two of those days to take care of them she's been taking care of the rest. Fortunately there does not appear to be any signs of active infection in regard to her toe at this point although obviously it doesn't look as good as it did previous. I think this is directly related to her not taking off the pressure and friction by way of taking things easy. Though I completely understand what's going on. 01/29/19 on evaluation today patient's tools are actually appears to be showing some signs of improvement today compared to last week's evaluation as far as not necessarily the overall size of the wound but the fact that she has some new skin growth in between the two ends of the wound opening. Overall I feel like she has done well she states that  she had a family member give her what sounds to be a CAM walker boot which has been helpful as well. 02/05/19 on evaluation today patient's wound bed actually appears to be doing significantly better in regard to her overall appearance of the size of the wound. With that being said she is still having an issue with offloading efficiently enough to get this to close. Apparently there is some signs of infection at this point as well unfortunately. Previously she's done well of Augmentin I really do not see anything that needs to be culture currently but there theme and cellulitis of the foot that I'm seeing I'm gonna go ahead and place her on an antibiotic today to try to help clear this up. 02/12/2019 on evaluation today patient actually appears to be doing poorly in regard to her overall wound status. She tells me she has been using her offloading shoe but actually comes in today wearing her tennis shoe with the AFO brace. Again as I previously discussed with her this is really not sufficient to allow the area to heal appropriately. Nonetheless she continues to be somewhat noncompliant and I do wonder based on what she has told my nurse in the past as to whether or not a good portion of this noncompliance may be recreational drug and alcohol related. She has had a history of heroin overdose and this was fairly recently in the past couple of months that have been seeing her. Nonetheless overall I feel like her wound looks significantly worse today compared to what it was previous. She still has significant erythema despite the Augmentin I am not sure that this is an appropriate medication for her infection I am also concerned that the infection may have gone down into her bone. 02/19/19 on evaluation today patient actually appears to be doing about the same in regard to her toe ulcer. Unfortunately she continues to show signs of bone exposure and infection at this point. There does not appear to be any  evidence of worsening of the infection but I'm also not really sure that it's getting significantly better. She is  on the Augmentin which should be sufficient for the Staphylococcus aureus infection that she has at this point. With that being said she may need IV antibiotics to more appropriately treat this. We did have a discussion today about hyperbaric option therapy. 02/28/19 on evaluation today patient actually appears to be doing much worse in regard to the wound on her left great toe as compared to even my previous evaluation last week. Unfortunately this seems to be training in a pretty poor direction. Her toe was actually now starting to angle laterally and I can actually see the entire joint area of the proximal portion of the digit where is the distal portion of the digit again is no longer even in contact with the joint line. Unfortunately there's a lot more necrotic tissue around the edge and the toe appears to be showing signs of becoming gangrenous in my pinion. I'm very concerned about were things stand at this point. She did see infectious disease and they are planning to send in a prescription for Sivextro for her and apparently this has been approved. With that being said I don't think she should avoid taking this but at the same time I'm not sure that it's gonna be sufficient to save her toe at this point. 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 03/05/19 on evaluation today patient actually appears to be doing quite well in regard to her toe all things considering. She still has bone exposed but there appears to be much less your thing on overall the appearance of the wound and the toe itself is dramatically improved. She still does have some issues currently obviously with  infection she did see vascular as well and there concerned that her blood flow to the toad. For that reason they are setting up for an angiogram next week. 03/14/19 on evaluation today patient appears to be doing very poor in regard to her toe and specifically in regard to the ulceration and the fact that she's starting to notice the toe was leaning even more towards the lateral aspect and the complete joint is visible on the proximal aspect of the joint. Nonetheless she's also noted a significant odor and the tip of the toe is turning more dark and necrotic appearing. Overall I think she is getting worse not better as far as this is concerned. For that reason I am recommending at this point that she likely needs to be seen for likely amputation. EMYA, PHAIR ([id number]) READMISSION 03/19/2021 This is a patient that we cared for in this clinic for a prolonged period of time in 2019 and 2020 with a left foot and left first toe wound. 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 Luana Shu. In December 2021 she had an ulcer on her right great toe as well as the fourth and fifth toes. 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 She also had an angiogram at that time and underwent angioplasty of the right anterior tibial artery. In any case she claims that the wound on the right foot is closed I did not look at this today which was probably an oversight  although I think that should be done next week. After her surgery she developed a dehiscence but I do not see any follow-up of this. According to Dr. Deborra Medina last review that she was out of the area being cared for by another physician but recently came back to his attention. 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 A culture of this area showed E. coli apparently before she came back to see  Dr. Luana Shu she was supposed to be receiving antibiotics but she did not really take them. Nor is she offloading this area at all. Finally her last hemoglobin A1c listed in epic was in March 2022 at 14.1 she says things are a lot better since then although I am not sure. 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 She had this wound at the time. During this admission she had E. coli sepsis a TEE was negative. 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 She is still a heavy cigarette smoker. She has a history of bipolar disorder seizure disorder ABI in our clinic was 1.05 6/1; left midfoot in the setting of a TMA done previously. Round circular wound with a "knuckle" of protruding tissue. The problem is that the knuckle was not attached to any of the surrounding granulation and this probed proximally widely I removed a large portion of this tissue. This wound goes with considerable undermining laterally. I do not feel any bone there was no purulence but this is a deep wound. 6/8; in spite of the debridement I did last week. She arrives with a wound looking exactly the same. A protruding "knuckle" of tissue nonadherent to most of the surrounding tissue. There is considerable depth around this from 6-12 o'clock at 2.7 cm and undermining of 1 cm. This does not look overtly infected and the x-ray I did last week was negative for any osseous abnormalities. We have been using silver collagen 6/15; deep tissue culture I did last week showed moderate staph aureus and moderate Pseudomonas. This will definitely require prolonged antibiotic therapy. The pathology on the  protuberant area was negative for malignancy fungus etc. 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 We have been using silver collagen. I am going to be prescribing Levaquin for 2 weeks. Her CT scan of the foot is down for 7/5 6/22; CT scan of the foot on 7 5. She says she has hardware in the left leg from her previous fracture. She is on the Levaquin for the deep tissue culture I did that showed methicillin sensitive staph aureus and Pseudomonas. I gave her a 2-week supply and she will have another week. She arrives in clinic today with the same protuberant tissue however this is nonadherent to the tissue surrounding it. I am really at a loss to explain this unless there is underlying deep tissue infection 6/29; patient presents for 1 week follow-up. She has been using collagen to the wound bed. She reports taking her antibiotics as prescribed.She has no complaints or issues today. She denies signs of infection. 7/6; patient presents for one week followup. She has been using collagen to the wound bed. She states she is taking Levaquin however at times she is  not able to keep it down. She denies signs of infection. 7/13; patient presents for 1 week follow-up. She has been using silver alginate to the wound bed. She still has nausea when taking her antibiotics. She denies signs of infection. 7/20; patient presents for 1 week follow-up. She has been using silver alginate with gentamicin cream to the wound bed. She denies any issues and has no complaints today. She denies signs of infection. 7/27; patient presents for 1 week follow-up. She continues to use silver alginate with gentamicin cream to the wound bed. She reports starting her antibiotics. She has no issues or complaints. Overall she reports stability to the wound. 8/3; patient presents for 1 week follow-up. She has been using silver alginate with gentamicin cream to the wound bed. She  reports completing all antibiotics. She has no issues or complaints today. She denies signs of infection. 8/17; patient presents for 2-week follow-up. He is to use silver alginate to the wound bed. She has no issues or complaints today. She denies signs of infection. She reports her pain has improved in her foot since last clinic visit 8/24; patient presents for 1 week follow-up. She continues to use silver alginate to the wound bed. She has no issues or complaints. She denies signs of infection. Pain is stable. 9/7; patient presents for follow-up. She missed her last week appointment due to feeling ill. She continues to use silver alginate. She has a new wound to the right lower extremity that is covered in eschar. She states It occurred over the past week and has no idea how it started. She currently denies signs of infection. 9/14; patient presents for follow-up. To the left foot wound she has been using gentamicin cream and silver alginate. To the right lower extremity wound she has been keeping this covered and has not obtain Santyl. 9/21; patient presents for follow-up. She reports using gentamicin cream and silver alginate to the left foot and Santyl to the right lower extremity wound. She has no issues or complaints today. She denies signs of infection. 9/28; patient presents for follow-up. She reports a new wound to her right heel. She states this occurred a few days ago and is progressively gotten worse. She has been trying to clean the area with a Q-tip and Santyl. She reports stability in the other 2 wounds. She has been using gentamicin cream and silver alginate to the left foot and Santyl to the right lower extremity wound. Patient History Information obtained from Patient. Social History Current every day smoker, Marital Status - Single, Alcohol Use - Never, Drug Use - Prior History, Caffeine Use - Daily. Medical History Ear/Nose/Mouth/Throat Patient has history of Chronic sinus  problems/congestion, Middle ear problems Hematologic/Lymphatic Patient has history of Anemia Respiratory Patient has history of Chronic Obstructive Pulmonary Disease (COPD) Sanders, Geoffrey A. ([id number]) Cardiovascular Patient has history of Congestive Heart Failure - EF 33% Endocrine Patient has history of Type II Diabetes Genitourinary Patient has history of End Stage Renal Disease Integumentary (Skin) Patient has history of History of pressure wounds Neurologic Patient has history of Neuropathy - feel and legs Objective Constitutional Vitals Time Taken: 11:04 AM, Height: 69 in, Weight: 185 lbs, BMI: 27.3, Temperature: 98.9 F, Pulse: 93 bpm, Respiratory Rate: 18 breaths/min, Blood Pressure: 130/93 mmHg. General Notes: Left foot (Transmetatarsal amputation): Wound to the plantar aspect that that has granulation tissue and nonviable tissue present. Right lower extremity: To the anterior aspect there is an open wound with slough and nonviable tissue throughout.  Right foot: To the heel there is an open wound with nonviable tissue and circumferential undermining. Slight erythema and increased warmth to this periwound. Integumentary (Hair, Skin) Wound #7 status is Open. Original cause of wound was Gradually Appeared. The date acquired was: 08/07/2020. The wound has been in treatment 17 weeks. The wound is located on the [place]. The wound measures 2.5cm length x 4cm width x 0.8cm depth; 7.854cm^2 area and 6.283cm^3 volume. There is Fat Layer (Subcutaneous Tissue) exposed. There is no tunneling or undermining noted. There is a medium amount of purulent drainage noted. The wound margin is thickened. There is medium (34-66%) red, pink, hyper - granulation within the wound bed. There is a medium (34-66%) amount of necrotic tissue within the wound bed including Adherent Slough. Wound #8 status is Open. Original cause of wound was Gradually Appeared. The date acquired was:  06/30/2021. The wound has been in treatment 3 weeks. The wound is located on the Right,Anterior Lower Leg. The wound measures 4cm length x 2.5cm width x 0.2cm depth; 7.854cm^2 area and 1.571cm^3 volume. There is Fat Layer (Subcutaneous Tissue) exposed. There is no tunneling or undermining noted. There is a medium amount of serosanguineous drainage noted. There is medium (34-66%) red granulation within the wound bed. There is a medium (34-66%) amount of necrotic tissue within the wound bed including Adherent Slough. Wound #9 status is Open. Original cause of wound was Gradually Appeared. The date acquired was: 07/18/2021. The wound is located on the Right Calcaneus. The wound measures 0.6cm length x 0.7cm width x 0.7cm depth; 0.33cm^2 area and 0.231cm^3 volume. There is Fat Layer (Subcutaneous Tissue) exposed. There is no tunneling noted, however, there is undermining starting at 12:00 and ending at 12:00 with a maximum distance of 0.6cm. There is a medium amount of serous drainage noted. The wound margin is thickened. There is large (67-100%) pink granulation within the wound bed. There is a small (1-33%) amount of necrotic tissue within the wound bed including Adherent Slough. Assessment Active Problems ICD-10 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ity Unspecified open wound, right lower leg, subsequent encounter Type 2 diabetes mellitus with foot ulcer Type 2 diabetes mellitus with diabetic polyneuropathy Unspecified open wound, right foot, initial encounter Patient's right lower extremity wound and left foot wound are stable. She declined debridement to the right lower extremity wound. I recommended continuing with Santyl to this area and silver alginate to the left foot wound. She has now developed a right heel wound that has progressed in the past several days. There is circumferential undermining with nonviable tissue present. We discussed today that  she is at high risk for amputation of both feet. Unfortunately patient is not able to offload. I will go ahead and prescribe clindamycin as the right heel wound appears infected. She will obtain her CT scan tomorrow of her left foot and I recommended getting a right foot x-ray as well. [name], [name] ([id number]) Procedures Wound #8 Pre-procedure diagnosis of Wound #8 is a Diabetic Wound/Ulcer of the Lower Extremity located on the Right,Anterior Lower Leg .Severity of Tissue Pre Debridement is: Fat layer exposed. There was a Chemical/Enzymatic/Mechanical debridement performed by Kalman Shan, MD. With the following instrument(s): cotton tipped applicator. Agent used was [employer]. A time out was conducted at 11:34, prior to the start of the procedure. There was no bleeding. The procedure was tolerated well. Post Debridement Measurements: 4cm length x 2.5cm width x 0.2cm depth; 1.571cm^3 volume. Character of  Wound/Ulcer Post Debridement is stable. Severity of Tissue Post Debridement is: Fat layer exposed. Post procedure Diagnosis Wound #8: Same as Pre-Procedure Plan Follow-up Appointments: Return Appointment in 1 week. Bathing/ Shower/ Hygiene: Clean wound with Normal Saline or wound cleanser. Off-Loading: Open toe surgical shoe with peg assist. Turn and reposition every 2 hours Medications-Please add to medication list.: P.O. Antibiotics Radiology ordered were: X-ray, foot - Right foot The following medication(s) was prescribed: clindamycin HCl oral 300 mg capsule 1 1 capsule oral QID x 7 days starting 07/21/2021 WOUND #7: - Foot Wound Laterality: Plantar, Left, Midline Cleanser: Soap and Water 1 x Per Day/30 Days Discharge Instructions: Gently cleanse wound with antibacterial soap, rinse and pat dry prior to dressing wounds Topical: Gentamicin 1 x Per Day/30 Days Discharge Instructions: Apply to wound bed and tunnel area under the dressing Topical: Santyl Collagenase Ointment, 30  (gm), tube 1 x Per Day/30 Days Discharge Instructions: Apply nickel thick to wound bed IN OFFICE ONLY Primary Dressing: Silvercel Small 2x2 (in/in) 1 x Per Day/30 Days Discharge Instructions: Apply to wound bed Secondary Dressing: ABD Pad 5x9 (in/in) 1 x Per Day/30 Days Discharge Instructions: Cover with ABD pad on top of dressing Secondary Dressing: Gauze 1 x Per Day/30 Days Discharge Instructions: To hold dressing in place Secured With: Kerlix Roll Sterile or Non-Sterile 6-ply 4.5x4 (yd/yd) 1 x Per Day/30 Days Discharge Instructions: Apply Kerlix as directed WOUND #8: - Lower Leg Wound Laterality: Right, Anterior Cleanser: Normal Saline 1 x Per Day/30 Days Discharge Instructions: Wash your hands with soap and water. Remove old dressing, discard into plastic bag and place into trash. Cleanse the wound with Normal Saline prior to applying a clean dressing using gauze sponges, not tissues or cotton balls. Do not scrub or use excessive force. Pat dry using gauze sponges, not tissue or cotton balls. Topical: Santyl Collagenase Ointment, 30 (gm), tube 1 x Per Day/30 Days Discharge Instructions: apply nickel thick to wound bed only Secondary Dressing: [place] Dressing, 4x4 (in/in) 1 x Per Day/30 Days Discharge Instructions: Apply over dressing to secure in place. WOUND #9: - Calcaneus Wound Laterality: Right Cleanser: Soap and Water 1 x Per Day/30 Days Discharge Instructions: Gently cleanse wound with antibacterial soap, rinse and pat dry prior to dressing wounds Primary Dressing: Silvercel Small 2x2 (in/in) 1 x Per Day/30 Days Discharge Instructions: Apply to wound bed Secondary Dressing: ABD Pad 5x9 (in/in) 1 x Per Day/30 Days Discharge Instructions: Cover with ABD pad on top of dressing Secondary Dressing: Gauze 1 x Per Day/30 Days Discharge Instructions: To hold dressing in place Secured With: Kerlix Roll Sterile or Non-Sterile 6-ply 4.5x4 (yd/yd) 1 x Per Day/30  Days Discharge Instructions: Apply Kerlix as directed 1. Santyl, gentamicin, silver alginate 2. Aggressive offloading 3. CT scan of the left foot and x-ray of the right foot 4. Clindamycin [name], [name] ([id number]) Electronic Signature(s) Signed: 07/21/2021 12:31:15 PM By: Kalman Shan DO Entered By: Kalman Shan on 07/21/2021 12:09:47 [name] ([id number]) -------------------------------------------------------------------------------- ROS/PFSH Details Patient Name: Heather Dear A. Date of Service: 07/21/2021 10:45 AM Medical Record Number: [id number] Patient Account Number: [id number] Date of Birth/Sex: May 22, 1976 (45 y.o. F) Treating RN: Dolan Amen Primary Care Provider: Raelene Bott Other Clinician: Referring Provider: Raelene Bott Treating Provider/Extender: Yaakov Guthrie in Treatment: 17 Information Obtained From Patient Ear/Nose/Mouth/Throat Medical History: Positive for: Chronic sinus problems/congestion; Middle ear problems Hematologic/Lymphatic Medical History: Positive for: Anemia Respiratory Medical History: Positive for: Chronic Obstructive Pulmonary Disease (COPD) Cardiovascular Medical History:  Positive for: Congestive Heart Failure - EF 33% Endocrine Medical History: Positive for: Type II Diabetes Time with diabetes: 14 years Treated with: Insulin, Oral agents Blood sugar tested every day: No Blood sugar testing results: Bedtime: 176 Genitourinary Medical History: Positive for: End Stage Renal Disease Integumentary (Skin) Medical History: Positive for: History of pressure wounds Neurologic Medical History: Positive for: Neuropathy - feel and legs HBO Extended History Items Ear/Nose/Mouth/Throat: Ear/Nose/Mouth/Throat: Chronic sinus problems/congestion Middle ear problems Immunizations Pneumococcal Vaccine: Received Pneumococcal Vaccination: No Ilic, Patsie A. ([id number]) Implantable Devices No devices  added Family and Social History Current every day smoker; Marital Status - Single; Alcohol Use: Never; Drug Use: Prior History; Caffeine Use: Daily Electronic Signature(s) Signed: 07/21/2021 12:31:15 PM By: Kalman Shan DO Signed: 07/21/2021 4:20:49 PM By: Dolan Amen RN Entered By: Kalman Shan on 07/21/2021 11:54:07 Riendeau, Lura AMarland Kitchen ([id number]) -------------------------------------------------------------------------------- [place] Details Patient Name: Heather Dear A. Date of Service: 07/21/2021 Medical Record Number: [id number] Patient Account Number: [id number] Date of Birth/Sex: 07-09-1976 (45 y.o. F) Treating RN: Dolan Amen Primary Care Provider: Raelene Bott Other Clinician: Referring Provider: Raelene Bott Treating Provider/Extender: Yaakov Guthrie in Treatment: 17 Diagnosis Coding ICD-10 Codes Code Description [phone] 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ity S81.801D Unspecified open wound, right lower leg, subsequent encounter E11.621 Type 2 diabetes mellitus with foot ulcer E11.42 Type 2 diabetes mellitus with diabetic polyneuropathy S91.301A Unspecified open wound, right foot, initial encounter Facility Procedures CPT4 Code: [id number] Description: [phone] - [place] W/O ANES NON SELECT Modifier: Quantity: 1 Physician Procedures CPT4 Code: [id number] Description: [id number] - WC PHYS LEVEL 4 - EST PT Modifier: Quantity: 1 CPT4 Code: Description: ICD-10 Diagnosis Description L97.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 of other part of left foot with other specifie S91.301A Unspecified open wound, right foot, initial encounter E11.621 Type 2 diabetes mellitus with foot ulcer S81.801D  Unspecified open wound, right lower leg, subsequent encounter Modifier: d severity Quantity: Electronic Signature(s) Signed: 07/21/2021 12:31:15 PM By: Kalman Shan DO Entered By: Kalman Shan on 07/21/2021 12:10:05

## 2021-07-22 ENCOUNTER — Ambulatory Visit
Admission: RE | Admit: 2021-07-22 | Discharge: 2021-07-22 | Disposition: A | Discharge status: Home / Self Care | Payer: Medicaid Other | Source: Ambulatory Visit | Attending: Internal Medicine | Admitting: Internal Medicine

## 2021-07-22 ENCOUNTER — Other Ambulatory Visit: Payer: Self-pay | Admitting: Internal Medicine

## 2021-07-22 ENCOUNTER — Ambulatory Visit
Admission: RE | Admit: 2021-07-22 | Discharge: 2021-07-22 | Disposition: A | Discharge status: Home / Self Care | Payer: Medicaid Other | Source: Home / Self Care | Attending: Internal Medicine | Admitting: Internal Medicine

## 2021-07-22 DIAGNOSIS — E11621 Type 2 diabetes mellitus with foot ulcer: Secondary | ICD-10-CM

## 2021-07-22 DIAGNOSIS — L97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ity: Secondary | ICD-10-CM | POA: Diagnosis present

## 2021-07-22 DIAGNOSIS — S81801A Unspecified open wound, right lower leg, initial encounter: Secondary | ICD-10-CM | POA: Insufficient documentation

## 2021-07-22 DIAGNOSIS — L97509 Non-pressure chronic ulcer of other part of unspecified foot with unspecified severity: Secondary | ICD-10-CM | POA: Insufficient documentation

## 2021-07-28 ENCOUNTER — Ambulatory Visit: Payer: Medicaid Other | Admitting: Internal Medicine

## 2021-08-02 ENCOUNTER — Ambulatory Visit (INDEPENDENT_AMBULATORY_CARE_PROVIDER_SITE_OTHER): Payer: Medicaid Other | Admitting: Vascular Surgery

## 2021-08-02 ENCOUNTER — Ambulatory Visit (INDEPENDENT_AMBULATORY_CARE_PROVIDER_SITE_OTHER): Payer: Medicaid Other

## 2021-08-02 ENCOUNTER — Other Ambulatory Visit: Payer: Self-pay

## 2021-08-02 VITALS — BP 141/88 | HR 123 | Ht 69.0 in | Wt 185.0 lb

## 2021-08-02 DIAGNOSIS — Z9862 Peripheral vascular angioplasty status: Secondary | ICD-10-CM | POA: Diagnosis not present

## 2021-08-02 DIAGNOSIS — E1169 Type 2 diabetes mellitus with other specified complication: Secondary | ICD-10-CM

## 2021-08-02 DIAGNOSIS — I10 Essential (primary) hypertension: Secondary | ICD-10-CM

## 2021-08-02 DIAGNOSIS — E785 Hyperlipidemia, unspecified: Secondary | ICD-10-CM

## 2021-08-02 DIAGNOSIS — I70261 Atherosclerosis of native arteries of extremities with gangrene, right leg: Secondary | ICD-10-CM

## 2021-08-02 DIAGNOSIS — I7025 Atherosclerosis of native arteries of other extremities with ulceration: Secondary | ICD-10-CM

## 2021-08-02 DIAGNOSIS — E1100 Type 2 diabetes mellitus with hyperosmolarity without nonketotic hyperglycemic-hyperosmolar coma (NKHHC): Secondary | ICD-10-CM

## 2021-08-02 DIAGNOSIS — I5022 Chronic systolic (congestive) heart failure: Secondary | ICD-10-CM

## 2021-08-02 NOTE — Progress Notes (Signed)
MRN : [id number]  [name] is a 45 y.o. ([birth year]) female who presents with chief complaint of check up.  History of Present Illness:   The patient returns to the office for followup and review status post angiogram with intervention ON 10/06/2020.  Procedure:  Percutaneous transluminal angioplasty to 3 mm right anterior tibial              The patient notes improvement in the lower extremity symptoms. No interval shortening of the patient's claudication distance or rest pain symptoms. Previous wounds have now healed.  No new ulcers or wounds have occurred since the last visit.  There have been no significant changes to the patient's overall health care.  The patient denies amaurosis fugax or recent TIA symptoms. There are no recent neurological changes noted. The patient denies history of DVT, PE or superficial thrombophlebitis. The patient denies recent episodes of angina or shortness of breath.   ABI's Rt=1.01 and Lt=1.01     Current Meds  Medication Sig   acetaminophen (TYLENOL) 325 MG tablet Take 2 tablets (650 mg total) by mouth every 6 (six) hours as needed for mild pain (or Fever >/= 101).   amitriptyline (ELAVIL) 150 MG tablet Take 0.5 tablets (75 mg total) by mouth at bedtime.   aspirin 325 MG tablet Take 1 tablet (325 mg total) by mouth daily.   aspirin EC 81 MG EC tablet Take 1 tablet (81 mg total) by mouth daily. Swallow whole.   atorvastatin (LIPITOR) 40 MG tablet Take 40 mg by mouth daily.   bethanechol (URECHOLINE) 25 MG tablet Take 25 mg by mouth 2 (two) times daily.    gabapentin (NEURONTIN) 100 MG capsule Take 1 capsule (100 mg total) by mouth 3 (three) times daily.   insulin aspart (NOVOLOG) 100 UNIT/ML injection Inject into the skin 4 (four) times daily -  before meals and at bedtime. Per Sliding Scale   insulin glargine (LANTUS) 100 UNIT/ML injection Inject 0.1 mLs (10 Units total) into the skin 2 (two) times daily.   magnesium oxide (MAG-OX) 400  (241.3 Mg) MG tablet Take 1 tablet (400 mg total) by mouth daily.   metoprolol tartrate (LOPRESSOR) 25 MG tablet Take 12.5 mg by mouth 2 (two) times daily.   pantoprazole (PROTONIX) 20 MG tablet Take 20 mg by mouth 2 (two) times daily.   promethazine (PHENERGAN) 25 MG tablet Take 25 mg by mouth every 6 (six) hours as needed for nausea or vomiting.   QUEtiapine (SEROQUEL) 200 MG tablet Take 1 tablet (200 mg total) by mouth at bedtime.   senna-docusate (SENOKOT-S) 8.6-50 MG tablet Take 1 tablet by mouth at bedtime as needed for mild constipation.   spironolactone (ALDACTONE) 25 MG tablet Take 25 mg by mouth daily.   tamsulosin (FLOMAX) 0.4 MG CAPS capsule Take 0.4 mg by mouth at bedtime.    venlafaxine XR (EFFEXOR-XR) 75 MG 24 hr capsule Take 1 capsule (75 mg total) by mouth at bedtime.    Past Medical History:  Diagnosis Date   Bipolar disorder ([place])    Chronic combined systolic and diastolic CHF (congestive heart failure) ([place])    a. ECHO 04/23/14 LV EF: 45% - 50%,  mild LVH, diffuse hypokinesis. LV diastolic dysfunction, high ventricular filling pressure. Mild LA dilation.   Cocaine abuse (HCC)    COPD (chronic obstructive pulmonary disease) (HCC)    Diabetes mellitus without complication (HCC)    Dyspnea    GERD (gastroesophageal reflux disease)  Heroin overdose ([place]) 10/2018   Hypertension    MRSA infection    arm wound   Nicotine dependence with current use    Seizures ([place])    last seizure March 2021; usual 1-2 per month    Past Surgical History:  Procedure Laterality Date   ABDOMINAL HYSTERECTOMY     ACHILLES TENDON SURGERY Left 03/31/2020   Procedure: ACHILLES LENGTHENING;  Surgeon: Caroline More, DPM;  Location: [facility];  Service: Podiatry;  Laterality: Left;   AMPUTATION Right 10/04/2020   Procedure: PARTIAL AMPUTATION RAY 4th and 5th;  Surgeon: Caroline More, DPM;  Location: [facility];  Service: Podiatry;  Laterality: Right;   AMPUTATION TOE Left 03/23/2019   Procedure:  AMPUTATION TOE GREAT LEFT TOE;  Surgeon: [name] Patricia, DPM;  Location: [facility];  Service: Podiatry;  Laterality: Left;   AMPUTATION TOE Left 08/16/2019   Procedure: AMPUTATION TOE 5th;  Surgeon: Caroline More, DPM;  Location: [facility];  Service: Podiatry;  Laterality: Left;   AMPUTATION TOE Left 02/05/2020   Procedure: AMPUTATION MPJ LEFT 4TH;  Surgeon: Caroline More, DPM;  Location: [facility];  Service: Podiatry;  Laterality: Left;   ANKLE FRACTURE SURGERY Left    pins/screws in place   BUBBLE STUDY  01/08/2021   Procedure: BUBBLE STUDY;  Surgeon: Lelon Perla, MD;  Location: [facility];  Service: Cardiovascular;;   CHOLECYSTECTOMY  2005   FRACTURE SURGERY     IRRIGATION AND DEBRIDEMENT FOOT Left 10/04/2020   Procedure: IRRIGATION AND DEBRIDEMENT FOOT;  Surgeon: Caroline More, DPM;  Location: [facility];  Service: Podiatry;  Laterality: Left;   LOWER EXTREMITY ANGIOGRAPHY Left 03/12/2019   Procedure: LOWER EXTREMITY ANGIOGRAPHY;  Surgeon: Katha Cabal, MD;  Location: [facility];  Service: Cardiovascular;  Laterality: Left;   LOWER EXTREMITY ANGIOGRAPHY Right 10/06/2020   Procedure: Lower Extremity Angiography;  Surgeon: Katha Cabal, MD;  Location: [facility];  Service: Cardiovascular;  Laterality: Right;   TEE WITHOUT CARDIOVERSION N/A 03/26/2019   Procedure: TRANSESOPHAGEAL ECHOCARDIOGRAM (TEE);  Surgeon: Nelva Bush, MD;  Location: [facility];  Service: Cardiovascular;  Laterality: N/A;   TEE WITHOUT CARDIOVERSION N/A 01/08/2021   Procedure: TRANSESOPHAGEAL ECHOCARDIOGRAM (TEE);  Surgeon: Lelon Perla, MD;  Location: [facility];  Service: Cardiovascular;  Laterality: N/A;   TRANSMETATARSAL AMPUTATION Left 03/31/2020   Procedure: TRANSMETATARSAL AMPUTATION;  Surgeon: Caroline More, DPM;  Location: [facility];  Service: Podiatry;  Laterality: Left;   TUBAL LIGATION      Social History Social History   Tobacco Use   Smoking status: Every Day     Packs/day: 1.00    Years: 28.00    Pack years: 28.00    Types: Cigarettes   Smokeless tobacco: Never  Vaping Use   Vaping Use: Never used  Substance Use Topics   Alcohol use: Not Currently    Alcohol/week: 0.0 standard drinks    Comment: Quit drinking 11/16 per pt   Drug use: Not Currently    Comment: last use 5 years ago 2016    Family History Family History  Problem Relation Age of Onset   Hypertension Other    Drug abuse Mother    Seizures Neg Hx     Allergies  Allergen Reactions   Albumin (Human)     unknown   Erythromycin Rash   Midecamycin Rash     REVIEW OF SYSTEMS (Negative unless checked)  Constitutional: '[]'$ Weight loss  '[]'$ Fever  '[]'$ Chills Cardiac: '[]'$ Chest pain   '[]'$ Chest pressure   '[]'$   Palpitations   '[]'$ Shortness of breath when laying flat   '[]'$ Shortness of breath with exertion. Vascular:  '[x]'$ Pain in legs with walking   '[x]'$ Pain in legs at rest  '[]'$ History of DVT   '[]'$ Phlebitis   '[]'$ Swelling in legs   '[]'$ Varicose veins   '[x]'$ Non-healing ulcers Pulmonary:   '[]'$ Uses home oxygen   '[]'$ Productive cough   '[]'$ Hemoptysis   '[]'$ Wheeze  '[]'$ COPD   '[]'$ Asthma Neurologic:  '[]'$ Dizziness   '[]'$ Seizures   '[]'$ History of stroke   '[]'$ History of TIA  '[]'$ Aphasia   '[]'$ Vissual changes   '[]'$ Weakness or numbness in arm   '[]'$ Weakness or numbness in leg Musculoskeletal:   '[]'$ Joint swelling   '[]'$ Joint pain   '[]'$ Low back pain Hematologic:  '[]'$ Easy bruising  '[]'$ Easy bleeding   '[]'$ Hypercoagulable state   '[]'$ Anemic Gastrointestinal:  '[]'$ Diarrhea   '[]'$ Vomiting  '[]'$ Gastroesophageal reflux/heartburn   '[]'$ Difficulty swallowing. Genitourinary:  '[]'$ Chronic kidney disease   '[]'$ Difficult urination  '[]'$ Frequent urination   '[]'$ Blood in urine Skin:  '[]'$ Rashes   '[x]'$ Ulcers  Psychological:  '[]'$ History of anxiety   '[]'$  History of major depression.  Physical Examination  Vitals:   08/02/21 1405  BP: (!) 141/88  Pulse: (!) 123  Weight: 185 lb (83.9 kg)  Height: '5\' 9"'$  (1.753 m)   Body mass index is 27.32 kg/m. Gen: WD/WN, NAD Head: Superior/AT, No  temporalis wasting.  Ear/Nose/Throat: Hearing grossly intact, nares w/o erythema or drainage Eyes: PER, EOMI, sclera nonicteric.  Neck: Supple, no masses.  No bruit or JVD.  Pulmonary:  Good air movement, no audible wheezing, no use of accessory muscles.  Cardiac: RRR, normal S1, S2, no Murmurs. Vascular:  multiple ulcers bilaterally  Vessel Right Left  Radial Palpable Palpable  PT Not Palpable Not Palpable  DP Not Palpable Not Palpable  Gastrointestinal: soft, non-distended. No guarding/no peritoneal signs.  Musculoskeletal: M/S 5/5 throughout.  No visible deformity.  Neurologic: CN 2-12 intact. Pain and light touch intact in extremities.  Symmetrical.  Speech is fluent. Motor exam as listed above. Psychiatric: Judgment intact, Mood & affect appropriate for pt's clinical situation. Dermatologic: No rashes or ulcers noted.  No changes consistent with cellulitis.   CBC Lab Results  Component Value Date   WBC 9.5 03/24/2021   HGB 11.1 (L) 03/24/2021   HCT 33.6 (L) 03/24/2021   MCV 84.0 03/24/2021   PLT 382 03/24/2021    BMET    Component Value Date/Time   NA 133 (L) 03/24/2021 1455   K 4.8 03/24/2021 1455   CL 97 (L) 03/24/2021 1455   CO2 24 03/24/2021 1455   GLUCOSE 270 (H) 03/24/2021 1455   BUN 38 (H) 03/24/2021 1455   CREATININE 2.61 (H) 03/24/2021 1455   CREATININE 1.54 (H) 02/26/2019 1139   CALCIUM 9.5 03/24/2021 1455   GFRNONAA 22 (L) 03/24/2021 1455   GFRAA 52 (L) 04/02/2020 0457   CrCl cannot be calculated (Patient's most recent lab result is older than the maximum 21 days allowed.).  COAG Lab Results  Component Value Date   INR 1.2 10/06/2020   INR 1.1 03/31/2020   INR 1.1 03/30/2020    Radiology CT FOOT LEFT WO CONTRAST  Result Date: 07/23/2021 CLINICAL DATA:  Chronic diabetic foot ulcer. EXAM: CT OF THE LEFT FOOT WITHOUT CONTRAST TECHNIQUE: Multidetector CT imaging of the left foot was performed according to the standard protocol. Multiplanar CT image  reconstructions were also generated. COMPARISON:  CT left foot dated April 27, 2021. FINDINGS: Bones/Joint/Cartilage Prior transmetatarsal amputation. Bony amputation margins are sharp. No bone destruction  or periosteal reaction. No fracture or dislocation. Prior bimalleolar ORIF. Unchanged mild tibiotalar osteoarthritis. No joint effusion. Osteopenia. Ligaments Ligaments are suboptimally evaluated by CT. Muscles and Tendons Chronic atrophy of the lower leg and foot musculature. Soft tissue Plantar ulceration at the level the medial naviculocuneiform joint again noted, increased in depth of ulceration compared to the prior study. Soft tissue swelling of the foot. No fluid collection or hematoma. No soft tissue mass. IMPRESSION: 1. Plantar ulceration at the level the medial naviculocuneiform joint, increased in depth of ulceration compared to the prior study. No CT evidence of osteomyelitis. No abscess. 2. Prior transmetatarsal amputation and bimalleolar ORIF. Electronically Signed   By: Titus Dubin M.D.   On: 07/23/2021 21:47   DG Foot Complete Right  Result Date: 07/23/2021 CLINICAL DATA:  Nonhealing wound. EXAM: RIGHT FOOT COMPLETE - 3+ VIEW COMPARISON:  Right foot x-rays dated October 04, 2020. FINDINGS: Subacute fracture of the mid third metatarsal shaft with one bone shaft width lateral displacement and 8 mm of overriding. Early surrounding callus formation. Prior fourth and fifth ray amputations. New erosive changes along the lateral aspect of the first TMT joint. New mild fragmentation along the dorsal aspect of the first and second metatarsal bases. Unchanged mild first MTP joint osteoarthritis. New superficial plantar heel ulceration. IMPRESSION: 1. Subacute displaced fracture of the mid third metatarsal shaft. 2. New erosive changes along the lateral aspect of the first TMT joint with fragmentation along the dorsal aspect of the first and second metatarsal bases. In the absence of nearby ulceration,  this may reflect neuropathic arthropathy. 3. New superficial plantar heel ulceration. Electronically Signed   By: Titus Dubin M.D.   On: 07/23/2021 21:55     Assessment/Plan 1. Atherosclerosis of native arteries of the extremities with ulceration ([place]) Recommend:  Patient should undergo arterial duplex of the lower extremity ASAP because there has been a significant deterioration in the patient's lower extremity symptoms.  The patient states they are having increased pain and a marked decrease in the distance that they can walk.  The risks and benefits as well as the alternatives were discussed in detail with the patient.  All questions were answered.  Patient agrees to proceed and understands this could be a prelude to angiography and intervention.  The patient will follow up with me in the office to review the studies.   - VAS [place] LOWER EXTREMITY ARTERIAL DUPLEX; Future  2. Essential hypertension Continue antihypertensive medications as already ordered, these medications have been reviewed and there are no changes at this time.   3. Chronic systolic CHF (congestive heart failure) (HCC) Continue cardiac and antihypertensive medications as already ordered and reviewed, no changes at this time.  Continue statin as ordered and reviewed, no changes at this time  Nitrates PRN for chest pain   4. Type 2 diabetes mellitus with hyperosmolarity without coma, unspecified whether long term insulin use (HCC) Continue hypoglycemic medications as already ordered, these medications have been reviewed and there are no changes at this time.  Hgb A1C to be monitored as already arranged by primary service   5. Hyperlipidemia associated with type 2 diabetes mellitus ([place]) Continue statin as ordered and reviewed, no changes at this time     [name], MD  08/02/2021 2:18 PM

## 2021-08-04 ENCOUNTER — Encounter: Payer: Medicaid Other | Attending: Internal Medicine | Admitting: Internal Medicine

## 2021-08-04 ENCOUNTER — Other Ambulatory Visit: Payer: Self-pay

## 2021-08-04 DIAGNOSIS — S81801D Unspecified open wound, right lower leg, subsequent encounter: Secondary | ICD-10-CM | POA: Insufficient documentation

## 2021-08-04 DIAGNOSIS — E11621 Type 2 diabetes mellitus with foot ulcer: Secondary | ICD-10-CM | POA: Insufficient documentation

## 2021-08-04 DIAGNOSIS — I509 Heart failure, unspecified: Secondary | ICD-10-CM | POA: Insufficient documentation

## 2021-08-04 DIAGNOSIS — E1142 Type 2 diabetes mellitus with diabetic polyneuropathy: Secondary | ICD-10-CM | POA: Diagnosis not present

## 2021-08-04 DIAGNOSIS — S91301A Unspecified open wound, right foot, initial encounter: Secondary | ICD-10-CM | POA: Diagnosis not present

## 2021-08-04 DIAGNOSIS — N186 End stage renal disease: Secondary | ICD-10-CM | POA: Diagnosis not present

## 2021-08-04 DIAGNOSIS — J449 Chronic obstructive pulmonary disease, unspecified: Secondary | ICD-10-CM | POA: Insufficient documentation

## 2021-08-04 DIAGNOSIS — X58XXXA Exposure to other specified factors, initial encounter: Secondary | ICD-10-CM | POA: Insufficient documentation

## 2021-08-04 DIAGNOSIS — L97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ity: Secondary | ICD-10-CM | POA: Diagnosis not present

## 2021-08-04 DIAGNOSIS — X58XXXD Exposure to other specified factors, subsequent encounter: Secondary | ICD-10-CM | POA: Diagnosis not present

## 2021-08-04 DIAGNOSIS — F17210 Nicotine dependence, cigarettes, uncomplicated: Secondary | ICD-10-CM | POA: Insufficient documentation

## 2021-08-04 DIAGNOSIS — E1122 Type 2 diabetes mellitus with diabetic chronic kidney disease: Secondary | ICD-10-CM | POA: Diagnosis not present

## 2021-08-04 NOTE — Progress Notes (Signed)
[name], [name] ([id number]) Visit Report for 08/04/2021 Arrival Information Details Patient Name: [name], [name] A. Date of Service: 08/04/2021 10:45 AM Medical Record Number: [id number] Patient Account Number: [id number] Date of Birth/Sex: [birth year] (45 y.o. F) Treating RN: [name] Primary Care [name]: [name] Other Clinician: Referring [name]: [name] Treating [name]/Extender: [name] in Treatment: 19 Visit Information History Since Last Visit Pain Present Now: Yes Patient Arrived: Wheel Chair Arrival Time: 11:30 Accompanied By: mother's husband Transfer Assistance: None Patient Identification Verified: Yes Secondary Verification Process Completed: Yes Patient Requires Transmission-Based No Precautions: Patient Has Alerts: Yes Patient Alerts: Patient on Blood Thinner '81mg'$  apirin Diabetic Type II Electronic Signature(s) Signed: 08/04/2021 4:18:42 PM By: Heather Amen RN Entered By: [name] on 08/04/2021 11:31:03 [name] ([id number]) -------------------------------------------------------------------------------- Clinic Level of Care Assessment Details Patient Name: Heather Dear A. Date of Service: 08/04/2021 10:45 AM Medical Record Number: [id number] Patient Account Number: [id number] Date of Birth/Sex: September 19, 1976 (45 y.o. F) Treating RN: [name] Primary Care [name]: [name] Other Clinician: Referring [name]: [name] Treating [name]/Extender: [name] in Treatment: 19 Clinic Level of Care Assessment Items TOOL 4 Quantity Score X - Use when only an EandM is performed on FOLLOW-UP visit 1 0 ASSESSMENTS - Nursing Assessment / Reassessment X - Reassessment of Co-morbidities (includes updates in patient status) 1 10 X- 1 5 Reassessment of Adherence to Treatment Plan ASSESSMENTS - Wound and Skin Assessment / Reassessment '[]'$  - Simple Wound Assessment / Reassessment - one  wound 0 X- 3 5 Complex Wound Assessment / Reassessment - multiple wounds '[]'$  - 0 Dermatologic / Skin Assessment (not related to wound area) ASSESSMENTS - Focused Assessment '[]'$  - Circumferential Edema Measurements - multi extremities 0 '[]'$  - 0 Nutritional Assessment / Counseling / Intervention '[]'$  - 0 Lower Extremity Assessment (monofilament, tuning fork, pulses) '[]'$  - 0 Peripheral Arterial Disease Assessment (using hand held doppler) ASSESSMENTS - Ostomy and/or Continence Assessment and Care '[]'$  - Incontinence Assessment and Management 0 '[]'$  - 0 Ostomy Care Assessment and Management (repouching, etc.) PROCESS - Coordination of Care X - Simple Patient / Family Education for ongoing care 1 15 '[]'$  - 0 Complex (extensive) Patient / Family Education for ongoing care '[]'$  - 0 Staff obtains Programmer, systems, Records, Test Results / Process Orders '[]'$  - 0 Staff telephones HHA, Nursing Homes / Clarify orders / etc '[]'$  - 0 Routine Transfer to another Facility (non-emergent condition) '[]'$  - 0 Routine Hospital Admission (non-emergent condition) '[]'$  - 0 New Admissions / Biomedical engineer / Ordering NPWT, Apligraf, etc. '[]'$  - 0 Emergency Hospital Admission (emergent condition) X- 1 10 Simple Discharge Coordination '[]'$  - 0 Complex (extensive) Discharge Coordination PROCESS - Special Needs '[]'$  - Pediatric / Minor Patient Management 0 '[]'$  - 0 Isolation Patient Management '[]'$  - 0 Hearing / Language / Visual special needs '[]'$  - 0 Assessment of Community assistance (transportation, D/C planning, etc.) '[]'$  - 0 Additional assistance / Altered mentation '[]'$  - 0 Support Surface(s) Assessment (bed, cushion, seat, etc.) INTERVENTIONS - Wound Cleansing / Measurement [name], [name] ([id number]) '[]'$  - 0 Simple Wound Cleansing - one wound X- 3 5 Complex Wound Cleansing - multiple wounds X- 1 5 Wound Imaging (photographs - any number of wounds) '[]'$  - 0 Wound Tracing (instead of photographs) '[]'$  - 0 Simple  Wound Measurement - one wound X- 3 5 Complex Wound Measurement - multiple wounds INTERVENTIONS - Wound Dressings '[]'$  - Small Wound Dressing one or multiple wounds 0 X- 1 15 Medium Wound  Dressing one or multiple wounds '[]'$  - 0 Large Wound Dressing one or multiple wounds '[]'$  - 0 Application of Medications - topical '[]'$  - 0 Application of Medications - injection INTERVENTIONS - Miscellaneous '[]'$  - External ear exam 0 '[]'$  - 0 Specimen Collection (cultures, biopsies, blood, body fluids, etc.) '[]'$  - 0 Specimen(s) / Culture(s) sent or taken to Lab for analysis '[]'$  - 0 Patient Transfer (multiple staff / Civil Service fast streamer / Similar devices) '[]'$  - 0 Simple Staple / Suture removal (25 or less) '[]'$  - 0 Complex Staple / Suture removal (26 or more) '[]'$  - 0 Hypo / Hyperglycemic Management (close monitor of Blood Glucose) '[]'$  - 0 Ankle / Brachial Index (ABI) - do not check if billed separately X- 1 5 Vital Signs Has the patient been seen at the hospital within the last three years: Yes Total Score: 110 Level Of Care: New/Established - Level 3 Electronic Signature(s) Signed: 08/04/2021 4:18:42 PM By: Heather Amen RN Entered By: [name] on 08/04/2021 14:20:36 [name] ([id number]) -------------------------------------------------------------------------------- Encounter Discharge Information Details Patient Name: Heather Dear A. Date of Service: 08/04/2021 10:45 AM Medical Record Number: [id number] Patient Account Number: [id number] Date of Birth/Sex: 1976-03-17 (45 y.o. F) Treating RN: [name] Primary Care Tyrah Broers: [name] Other Clinician: Referring Noah Lembke: [name] Treating Corneilus Heggie/Extender: [name] in Treatment: 83 Encounter Discharge Information Items Discharge Condition: Stable Ambulatory Status: Wheelchair Discharge Destination: Home Transportation: Private Auto Accompanied By: mother's husband Schedule Follow-up Appointment:  Yes Clinical Summary of Care: Electronic Signature(s) Signed: 08/04/2021 2:22:09 PM By: Heather Amen RN Entered By: [name] on 08/04/2021 14:22:09 [name] ([id number]) -------------------------------------------------------------------------------- Lower Extremity Assessment Details Patient Name: Heather Dear A. Date of Service: 08/04/2021 10:45 AM Medical Record Number: [id number] Patient Account Number: [id number] Date of Birth/Sex: 1976-01-02 (45 y.o. F) Treating RN: [name] Primary Care Traye Bates: [name] Other Clinician: Referring Charlie Char: [name] Treating Azariah Latendresse/Extender: [name] in Treatment: 19 Edema Assessment Assessed: [name]: Yes] [name]: Yes] [Left: Edema] [Right: :] Vascular Assessment Pulses: Dorsalis Pedis Palpable: [Left:Yes] [Right:Yes] Electronic Signature(s) Signed: 08/04/2021 4:18:42 PM By: Heather Amen RN Entered By: [name] on 08/04/2021 11:57:51 [name], [name] ([id number]) -------------------------------------------------------------------------------- Multi Wound Chart Details Patient Name: Heather Dear A. Date of Service: 08/04/2021 10:45 AM Medical Record Number: [id number] Patient Account Number: [id number] Date of Birth/Sex: 1976-06-30 (45 y.o. 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/Ulcer of the  Lower Extremity Extremity Extremity Comorbid History: Chronic sinus problems/congestion, Chronic sinus problems/congestion, Chronic sinus problems/congestion, Middle ear problems, Anemia, Middle ear problems, Anemia, Middle ear problems, Anemia, Chronic Obstructive Pulmonary Chronic Obstructive Pulmonary Chronic Obstructive Pulmonary Disease (COPD), Congestive Heart Disease (COPD), Congestive Heart Disease (COPD), Congestive Heart Failure, Type II Diabetes, End Stage Failure, Type II Diabetes, End Stage Failure, Type II Diabetes, End Stage Renal Disease, History of pressure Renal Disease, History of pressure Renal Disease, History of pressure wounds, Neuropathy wounds, Neuropathy wounds, Neuropathy Date Acquired: 08/07/2020 06/30/2021 07/18/2021 Weeks of Treatment: '19 5 2 '$ Wound Status: Open Open Open Measurements L x W x D (cm) 1.6x3.5x0.8 4x1.8x0.2 0.5x1.2x0.2 Area (  cm) : 4.398 5.655 0.471 Volume (cm) : 3.519 1.131 0.094 % Reduction in Area: -64.70% 10.00% -42.70% % Reduction in Volume: -163.60% -80.10% 59.30% Classification: Grade 2 Grade 1 Grade 1 Exudate Amount: Medium Medium Medium Exudate Type: Purulent Serosanguineous Serous Exudate Color: yellow, brown, green red, brown amber Wound Margin: Thickened N/A Flat and Intact Granulation Amount: Large (67-100%) Medium (34-66%) Large (67-100%) Granulation Quality: Red, Pink, Hyper-granulation Red, Pink Pink Necrotic Amount: Small (1-33%) Medium (34-66%) None Present (0%) Exposed Structures: Fat Layer (Subcutaneous Tissue): Fat Layer (Subcutaneous Tissue): Fat Layer (Subcutaneous Tissue): Yes Yes Yes Fascia: No Fascia: No Fascia: No Tendon: No Tendon: No Tendon: No Muscle: No Muscle: No Muscle: No Joint: No Joint: No Joint: No Bone: No Bone: No Bone: No Epithelialization: None None Medium (34-66%) Treatment Notes Electronic Signature(s) Signed: 08/04/2021 12:23:03 PM By: Owens Shark, Tiani AMarland Kitchen  ([id number]) Entered By: Kalman Shan on 08/04/2021 12:14:34 [name] ([id number]) -------------------------------------------------------------------------------- Multi-Disciplinary Care Plan Details Patient Name: Heather Dear A. Date of Service: 08/04/2021 10:45 AM Medical Record Number: [id number] Patient Account Number: [id number] Date of Birth/Sex: [birth year] (45 y.o. F) Treating RN: [name] Primary Care Avanell Banwart: [name] Other Clinician: Referring Mai Longnecker: [name] Treating Jamorris Ndiaye/Extender: [name] in Treatment: 29 Active Inactive Electronic Signature(s) Signed: 08/04/2021 4:18:42 PM By: Heather Amen RN Entered By: [name] on 08/04/2021 11:59:25 [name] ([id number]) -------------------------------------------------------------------------------- Pain Assessment Details Patient Name: Heather Dear A. Date of Service: 08/04/2021 10:45 AM Medical Record Number: [id number] Patient Account Number: [id number] Date of Birth/Sex: [birth year] (45 y.o. F) Treating RN: [name] Primary Care Stevi Hollinshead: [name] Other Clinician: Referring Felissa Blouch: [name] Treating Jc Veron/Extender: [name] in Treatment: 107 Active Problems Location of Pain Severity and Description of Pain Patient Has Paino Yes Site Locations Pain Location: Pain in Ulcers Rate the pain. Current Pain Level: 8 Pain Management and Medication Current Pain Management: Electronic Signature(s) Signed: 08/04/2021 4:18:42 PM By: Heather Amen RN Entered By: [name] on 08/04/2021 11:32:48 [name] ([id number]) -------------------------------------------------------------------------------- Patient/Caregiver Education Details Patient Name: Heather Dear A. Date of Service: 08/04/2021 10:45 AM Medical Record Number: [id number] Patient Account Number: [id number] Date of Birth/Gender: [birth year] (45 y.o.  F) Treating RN: [name] Primary Care Physician: [name] Other Clinician: Referring Physician: Raelene [name] Treating Physician/Extender: [name] in Treatment: 8 Education Assessment Education Provided To: Patient Education Topics Provided Wound/Skin Impairment: Methods: Explain/Verbal Responses: State content correctly Electronic Signature(s) Signed: 08/04/2021 4:18:42 PM By: Heather Amen RN Entered By: [name] on 08/04/2021 14:21:04 [name] ([id number]) -------------------------------------------------------------------------------- Wound Assessment Details Patient Name: Heather Dear A. Date of Service: 08/04/2021 10:45 AM Medical Record Number: [id number] Patient Account Number: [id number] Date of Birth/Sex: 16-Jul-1976 (45 y.o. F) Treating RN: [name] Primary Care Oaklyn Jakubek: [name] Other Clinician: Referring Lizzy Hamre: [name] Treating Craig Ionescu/Extender: [name] in Treatment: 19 Wound Status Wound Number: 7 Primary Diabetic Wound/Ulcer of the Lower Extremity Etiology: Wound Location: Left, Midline, Plantar Foot Wound Open Wounding Event: Gradually Appeared Status: Date Acquired: 08/07/2020 Comorbid Chronic sinus problems/congestion, Middle ear problems, Weeks Of Treatment: 19 History: Anemia, Chronic Obstructive Pulmonary Disease (COPD), Clustered Wound: No Congestive Heart Failure, Type II Diabetes, End Stage Renal Disease, History of pressure wounds, Neuropathy Photos Wound Measurements Length: (cm) 1.6 Width: (cm) 3.5 Depth: (cm) 0.8 Area: (cm) 4.398 Volume: (cm) 3.519 % Reduction in Area: -64.7% % Reduction in Volume: -163.6% Epithelialization: None Tunneling: No Undermining: No Wound Description Classification: Grade 2 Wound Margin: Thickened  Exudate Amount: Medium Exudate Type: Purulent Exudate Color: yellow, brown, green Foul Odor After Cleansing:  No Slough/Fibrino Yes Wound Bed Granulation Amount: Large (67-100%) Exposed Structure Granulation Quality: Red, Pink, Hyper-granulation Fascia Exposed: No Necrotic Amount: Small (1-33%) Fat Layer (Subcutaneous Tissue) Exposed: Yes Necrotic Quality: Adherent Slough Tendon Exposed: No Muscle Exposed: No Joint Exposed: No Bone Exposed: No Treatment Notes Wound #7 (Foot) Wound Laterality: Plantar, Left, Midline Cleanser Soap and Water Discharge Instruction: Gently cleanse wound with antibacterial soap, rinse and pat dry prior to dressing wounds Follette, Amiyah A. ([id number]) Peri-Wound Care Topical Gentamicin Discharge Instruction: Apply to wound bed and tunnel area under the dressing Santyl Collagenase Ointment, 30 (gm), tube Discharge Instruction: Apply nickel thick to wound bed IN OFFICE ONLY Primary Dressing Silvercel Small 2x2 (in/in) Discharge Instruction: Apply to wound bed Secondary Dressing ABD Pad 5x9 (in/in) Discharge Instruction: Cover with ABD pad on top of dressing Gauze Discharge Instruction: To hold dressing in place Secured With Kerlix Roll Sterile or Non-Sterile 6-ply 4.5x4 (yd/yd) Discharge Instruction: Apply Kerlix as directed Compression Wrap Compression Stockings Add-Ons Electronic Signature(s) Signed: 08/04/2021 4:18:42 PM By: Heather Amen RN Entered By: [name] on 08/04/2021 11:42:45 [name], [name] ([id number]) -------------------------------------------------------------------------------- Wound Assessment Details Patient Name: Heather Dear A. Date of Service: 08/04/2021 10:45 AM Medical Record Number: [id number] Patient Account Number: [id number] Date of Birth/Sex: 09/19/1976 (45 y.o. F) Treating RN: [name] Primary Care Yuridiana Formanek: [name] Other Clinician: Referring Cort Dragoo: [name] Treating Jaquane Boughner/Extender: [name] in Treatment: 19 Wound Status Wound Number: 8 Primary Diabetic Wound/Ulcer of  the Lower Extremity Etiology: Wound Location: Right, Anterior Lower Leg Wound Open Wounding Event: Gradually Appeared Status: Date Acquired: 06/30/2021 Comorbid Chronic sinus problems/congestion, Middle ear problems, Weeks Of Treatment: 5 History: Anemia, Chronic Obstructive Pulmonary Disease (COPD), Clustered Wound: No Congestive Heart Failure, Type II Diabetes, End Stage Renal Disease, History of pressure wounds, Neuropathy Photos Wound Measurements Length: (cm) 4 Width: (cm) 1.8 Depth: (cm) 0.2 Area: (cm) 5.655 Volume: (cm) 1.131 % Reduction in Area: 10% % Reduction in Volume: -80.1% Epithelialization: None Tunneling: No Undermining: No Wound Description Classification: Grade 1 Exudate Amount: Medium Exudate Type: Serosanguineous Exudate Color: red, brown Foul Odor After Cleansing: No Slough/Fibrino No Wound Bed Granulation Amount: Medium (34-66%) Exposed Structure Granulation Quality: Red, Pink Fascia Exposed: No Necrotic Amount: Medium (34-66%) Fat Layer (Subcutaneous Tissue) Exposed: Yes Necrotic Quality: Adherent Slough Tendon Exposed: No Muscle Exposed: No Joint Exposed: No Bone Exposed: No Treatment Notes Wound #8 (Lower Leg) Wound Laterality: Right, Anterior Cleanser Normal Saline Discharge Instruction: Wash your hands with soap and water. Remove old dressing, discard into plastic bag and place into trash. Cleanse the wound with Normal Saline prior to applying a clean dressing using gauze sponges, not tissues or cotton balls. Do not [name], [name] ([id number]) scrub or use excessive force. Pat dry using gauze sponges, not tissue or cotton balls. Peri-Wound Care Topical Santyl Collagenase Ointment, 30 (gm), tube Discharge Instruction: apply nickel thick to wound bed only Primary Dressing Secondary [place], 4x4 (in/in) Discharge Instruction: Apply over dressing to secure in place. Secured With Compression  Wrap Compression Stockings Add-Ons Electronic Signature(s) Signed: 08/04/2021 4:18:42 PM By: Heather Amen RN Entered By: [name] on 08/04/2021 11:43:20 [name], [name] ([id number]) -------------------------------------------------------------------------------- Wound Assessment Details Patient Name: Heather Dear A. Date of Service: 08/04/2021 10:45 AM Medical Record Number: [id number] Patient Account Number: [id number] Date of Birth/Sex: December 26, 1975 (45 y.o. F) Treating RN: [name] Primary Care Mariano Doshi: [place] Other Clinician: Referring Quame Spratlin: [name] Treating Jadis Mika/Extender: [name] in Treatment: 19 Wound Status Wound Number: 9 Primary Diabetic Wound/Ulcer of the Lower Extremity Etiology: Wound Location: Right Calcaneus Wound Open Wounding Event: Gradually Appeared Status: Date Acquired: 07/18/2021 Comorbid Chronic sinus problems/congestion, Middle ear problems, Weeks Of Treatment: 2 History: Anemia, Chronic Obstructive Pulmonary Disease (COPD), Clustered Wound: No Congestive Heart Failure, Type II Diabetes, End Stage Renal Disease, History of pressure wounds, Neuropathy Photos Wound Measurements Length: (cm) 0.5 Width: (cm) 1.2 Depth: (cm) 0.2 Area: (cm) 0.471 Volume: (cm) 0.094 % Reduction in Area: -42.7% % Reduction in Volume: 59.3% Epithelialization: Medium (34-66%) Tunneling: No Undermining: No Wound Description Classification: Grade 1 Wound Margin: Flat and Intact Exudate Amount: Medium Exudate Type: Serous Exudate Color: amber Foul Odor After Cleansing: No Slough/Fibrino No Wound Bed Granulation Amount: Large (67-100%) Exposed Structure Granulation Quality: Pink Fascia Exposed: No Necrotic Amount: None Present (0%) Fat Layer (Subcutaneous Tissue) Exposed: Yes Tendon Exposed: No Muscle Exposed: No Joint Exposed: No Bone Exposed: No Treatment Notes Wound #9 (Calcaneus) Wound Laterality:  Right Cleanser Soap and Water Discharge Instruction: Gently cleanse wound with antibacterial soap, rinse and pat dry prior to dressing wounds [name], [name] ([id number]) Peri-Wound Care Topical Primary Dressing Silvercel Small 2x2 (in/in) Discharge Instruction: Apply to wound bed Secondary Dressing ABD Pad 5x9 (in/in) Discharge Instruction: Cover with ABD pad on top of dressing Gauze Discharge Instruction: To hold dressing in place Secured With Kerlix Roll Sterile or Non-Sterile 6-ply 4.5x4 (yd/yd) Discharge Instruction: Apply Kerlix as directed Compression Wrap Compression Stockings Add-Ons Electronic Signature(s) Signed: 08/04/2021 4:18:42 PM By: Heather Amen RN Entered By: [name] on 08/04/2021 11:43:49 [name], [name] ([id number]) -------------------------------------------------------------------------------- [place] Details Patient Name: Heather Dear A. Date of Service: 08/04/2021 10:45 AM Medical Record Number: [id number] Patient Account Number: [id number] Date of Birth/Sex: Mar 19, 1976 (45 y.o. F) Treating RN: [name] Primary Care Delesia Martinek: [name] Other Clinician: Referring Alan Riles: [name] Treating Marylene Masek/Extender: [name] in Treatment: 19 Vital Signs Time Taken: 11:31 Temperature (F): 98.2 Height (in): 69 Pulse (bpm): 123 Weight (lbs): 185 Respiratory Rate (breaths/min): 18 Body Mass Index (BMI): 27.3 Blood Pressure (mmHg): 119/81 Reference Range: 80 - 120 mg / dl Electronic Signature(s) Signed: 08/04/2021 4:18:42 PM By: Heather Amen RN Entered By: [name] on 08/04/2021 11:32:30

## 2021-08-04 NOTE — Progress Notes (Signed)
JANNICE, FAIVRE ([id number]) Visit Report for 08/04/2021 Chief Complaint Document Details Patient Name: BERKELEY, CARDIEL A. Date of Service: 08/04/2021 10:45 AM Medical Record Number: [id number] Patient Account Number: [id number] Date of Birth/Sex: [birth year] (45 y.o. F) Treating RN: Dolan Amen Primary Care Provider: Raelene Bott Other Clinician: Referring Provider: Raelene Bott Treating Provider/Extender: Yaakov Guthrie in Treatment: 50 Information Obtained from: Patient Chief Complaint 03/19/2021; patient referred by Dr. Luana Shu who has been looking after her left foot for quite a period of time for review of a nonhealing area in the left midfoot Electronic Signature(s) Signed: 08/04/2021 12:23:03 PM By: Kalman Shan DO Entered By: Kalman Shan on 08/04/2021 12:14:49 Ludemann, Kendi A. ([id number]) -------------------------------------------------------------------------------- HPI Details Patient Name: [name] Date of Service: 08/04/2021 10:45 AM Medical Record Number: [id number] Patient Account Number: [id number] Date of Birth/Sex: 1975-11-08 (45 y.o. F) Treating RN: Dolan Amen Primary Care Provider: Raelene Bott Other Clinician: Referring Provider: Raelene Bott Treating Provider/Extender: Yaakov Guthrie in Treatment: 61 History of Present Illness HPI Description: 01/18/18-She is here for initial evaluation of the left great toe ulcer. She is a poor historian in regards to timeframe in detail. She states approximately 4 weeks ago she lacerated her toe on something in the house. She followed up with her primary care who placed her on Bactrim and ultimately a second dose of Bactrim prior to coming to wound clinic. She states she has been treating the toe with peroxide, Betadine and a Band-Aid. She did not check her blood sugar this morning but checked it yesterday morning it was 327; she is unaware of a recent A1c and there are no current  records. She saw Dr. she would've orthopedics last week for an old injury to the left ankle, she states he did not see her toe, nor did she bring it to his attention. She smokes approximately 1 pack cigarettes a day. Her social situation is concerning, she arrives this morning with her mother who appears extremely intoxicated/under the influence; her mother was asked to leave the room and be monitored by the patient's grandmother. The patient's aunt then accompanied the patient and the room throughout the rest of the appointment. We had a lengthy discussion regarding the deleterious effects of uncontrolled hyperglycemia and smoking as it relates to wound healing and overall health. She was strongly encouraged to decrease her smoking and get her diabetes under better control. She states she is currently on a diet and has cut down her [facility] consumption. The left toe is erythematous, macerated and slightly edematous with malodor present. The edema in her left foot is below her baseline, there is no erythema streaking. We will treat her with Santyl, doxycycline; we have ordered and xray, culture and provided a Peg assist surgical shoe and cultured the wound. 01/25/18-She is here in follow-up evaluation for a left great toe ulcer and presents with an abscess to her suprapubic area. She states her blood sugars remain elevated, feeling "sick" and if levels are below 250, but she is trying. She has made no attempt to decrease her smoking stating that we "can't take away her food in her cigarettes". She has been compliant with offloading using the PEG assist you. She is using Santyl daily. the culture obtained last week grew staph aureus and Enterococcus faecalis; continues on the doxycycline and Augmentin was added on Monday. The suprapubic area has erythema, no femoral variation, purple discoloration, minimal induration, was accessed with a cotton tip applicator with sanguinopurulent drainage, this was  cultured, I suspect the current antibiotic treatment will cover and we will not add anything to her current treatment plan. She was advised to go to urgent care or ER with any change in redness, induration or fever. 02/01/18-She is here in follow-up evaluation for left great toe ulcers and a new abdominal abscess from last week. She was able to use packing until earlier this week, where she "forgot it was there". She states she was feeling ill with GI symptoms last week and was not taking her antibiotic. She states her glucose levels have been predominantly less than 200, with occasional levels between 200-250. She thinks this was contributing to her GI symptoms as they have resolved without intervention. There continues to be significant laceration to left toe, otherwise it clinically looks stable/improved. There is now less superficial opening to the lateral aspect of the great toe that was residual blister. We will transition to [facility] to all wounds, she will continue her Augmentin. If there is no change or deterioration next week for reculture. 02/08/18-She is here in follow-up evaluation for left great toe ulcer and abdominal ulcer. There is an improvement in both wounds. She has been wrapping her left toe with coban, not by our direction, which has created an area of discoloration to the medial aspect; she has been advised to NOT use coban secondary to her neuropathy. She states her glucose levels have been high over this last week ranging from 200-350, she continues to smoke. She admits to being less compliant with her offloading shoe. We will continue with same treatment plan and she will follow-up next week. 02/15/18-She is here in follow-up evaluation for left great toe ulcer and abdominal ulcer. The abdominal ulcer is epithelialized. The left great toe ulcer is improved and all injury from last week using the Coban wrap is resolved, the lateral ulcer is healed. She admits to  noncompliance with wearing offloading shoe and admits to glucose levels being greater than 300 most of the week. She continues to smoke and expresses no desire to quit. There is one area medially that probes deeper than it has historically, erythema to the toe and dorsal foot has consistently waxed and waned. There is no overt signs of cellulitis or infection but we will culture the wound for any occult infection given the new area of depth and erythema. We will hold off on sensitivities for initiation of antibiotic therapy. 02/22/18-She is here in follow up evaluation for left great toe ulcer. There is overall significant improvement in both wound appearance, erythema and edema with changes made last week. She was not initiated on antibiotic therapy. Culture obtained last week showed oxacillin sensitive staph aureus, sensitive to clindamycin. Clindamycin has been called into the pharmacy but she has been instructed to hold off on initiation secondary to overall clinical improvement and her history of antibiotic intolerance. She has been instructed to contact the clinic with any noted changes/deterioration and the wound, erythema, edema and/or pain. She will follow-up next week. She continues to smoke and her glucose levels remain elevated >250; she admits to compliance with offloading shoe 03/01/18 on evaluation today patient appears to be doing fairly well in regard to her left first toe ulcer. She has been tolerating the dressing changes with the [facility] Dressing without complication and overall this has definitely showed signs of improvement according to records as well is what the patient tells me today. I'm very pleased in that regard. She is having no pain today 03/08/18 She  is here for follow up evaluation of a left great toe ulcer. She remains non-compliant with glucose control and smoking cessation; glucose levels consistently >200. She states that she got new shoe inserts/peg assist. She  admits to compliance with offloading. Since my last evaluation there is significant improvement. We will switch to prisma at this time and she will follow up next week. She is noted to be tachycardic at this appointment, heart rate 120s; she has a history of heart rate 70-130 according to our records. She admits to extreme agitation r/t personal issues; she was advised to monitor her heartrate and contact her physician if it does not return to a more normal range (<100). She takes cardizem twice daily. 03/15/18-She is here in follow-up evaluation for left great toe ulcer. She remains noncompliant with glucose control and smoking cessation. She admits to compliance with wearing offloading shoe. The ulcer is improved/stable and we will continue with the same treatment plan and she will follow-up next week 03/22/18-She is here for evaluation for left great toe ulcer. There continues to be significant improvement despite recurrent hyperglycemia (over 500 yesterday) and she continues to smoke. She has been compliant with offloading and we will continue with same treatment plan and she will follow-up next week. 03/29/18-She is here for evaluation for left great toe ulcer. Despite continuing to smoke and uncontrolled diabetes she continues to improve. She is compliant with offloading shoe. We will continue with the same treatment plan and she will follow-up next week 04/05/18- She is here in follow up evaluation for a left great toe ulcer; she presents with small pustule to left fifth toe (resembles ant bite). She admits to compliance with wearing offloading shoe; continues to smoke or have uncontrolled blood glucose control. There is more callus than usual with evidence of bleeding; she denies known trauma. 04/12/18-She is here for evaluation of left great toe ulcer. Despite noncompliance with glycemic control and smoking she continues to make Toppin, Lizzeth A. ([id number]) improvement. She continues to wear  offloading shoe. The pustule, that was identified last week, to the left fifth toe is resolved. She will follow-up in 2 weeks 05/03/18-she is seen in follow-up evaluation for a left great toe ulcer. She is compliant with offloading, otherwise noncompliant with glycemic control and smoking. She has plateaued and there is minimal improvement noted. We will transition to [facility], replaced the insert to her surgical shoe and she will follow-up in one week 05/10/18- She is here in follow up evaluation for a left great toe ulcer. It appears stable despite measurement change. We will continue with same treatment plan and follow up next week. 05/24/18-She is seen in follow-up evaluation for a left great toe ulcer. She remains compliant with offloading, has made significant improvement in her diet, decreasing the amount of sugar/soda. She said her recent A1c was 10.9 which is lower than. She did see a diabetic nutritionist/educator yesterday. She continues to smoke. We will continue with the same treatment plan and she'll follow-up next week. 05/31/18- She is seen in follow-up evaluation for left great toe ulcer. She continues to remain compliant with offloading, continues to make improvement in her diet, increasing her water and decreasing the amount of sugar/soda. She does continue to smoke with no desire to quit. We will apply Prisma to the depth and Hydrofera Blue over. We have not received insurance authorization for oasis. She will follow up next week. 06/07/18-She is seen in follow-up evaluation for left great toe ulcer. It has  stalled according to today's measurements although base appears stable. She says she saw a diabetic educator yesterday; her average blood sugars are less than 300 which is an improvement for her. She continues to smoke and states "that's my next step" She continues with water over soda. We will order for xray, culture and reinstate ace wrap compression prior to placing apligraf  for next week. She is voicing no complaints or concerns. Her dressing will change to iodoflex over the next week in preparation for apligraf. 06/14/18-She is seen in follow-up evaluation for left great toe ulcer. Plain film x-ray performed last week was negative for osteomyelitis. Wound culture obtained last week grew strep B and OSSA; she is initiated on keflex and cefdinir today; there is erythema to the toe which could be from ace wrap compression, she has a history of wrapping too tight and has has been encouraged to maintain ace wraps that we place today. We will hold off on application of apligraf today, will apply next week after antibiotic therapy has been initiated. She admits today that she has resumed taking a shower with her foot/toe submerged in water, she has been reminded to keep foot/toe out of the bath water. She will be seen in follow up next week 06/21/18-she is seen in follow-up evaluation for left great toe ulcer. She is tolerating antibiotic therapy with no GI disturbance. The wound is stable. Apligraf was applied today. She has been decreasing her smoking, only had 4 cigarettes yesterday and 1 today. She continues being more compliant in diabetic diet. She will follow-up next week for evaluation of site, if stable will remove at 2 weeks. 06/28/18- She is here in follow up evalution. Apligraf was placed last week, she states the dressing fell off on Tuesday and she was dressing with hydrofera blue. She is healed and will be discharged from the clinic today. She has been instructed to continue with smoking cessation, continue monitoring glucose levels, offloading for an additional 4 weeks and continue with hydrofera blue for additional two weeks for any possible microscopic opening. Readmission: 08/07/18 on evaluation today patient presents for reevaluation concerning the ulcer of her right great toe. She was previously discharged on 06/28/18 healed. Nonetheless she states that this  began to show signs of drainage she subsequently went to her primary care provider. Subsequently an x-ray was performed on 08/01/18 which was negative. The patient was also placed on antibiotics at that time. Fortunately they should have been effective for the infection. Nonetheless she's been experiencing some improvement but still has a lot of drainage coming from the wound itself. 08/14/18 on evaluation today patient's wound actually does show signs of improvement in regard to the erythema at this point. She has completed the antibiotics. With that being said we did discuss the possibility of placing her in a total contact cast as of today although I think that I may want to give this just a little bit more time to ensure nothing recurrence as far as her infection is concerned. I do not want to put in the cast and risk infection at that time if things are not completely resolved. With that being said she is gonna require some debridement today. 08/21/18 on evaluation today patient actually appears to be doing okay in regard to her toe ulcer. She's been tolerating the dressing changes without complication. With that being said it does appear that she is ready and in fact I think it's appropriate for [place] to go ahead and initiate the  total contact cast today. Nonetheless she will require some sharp debridement to prepare the wound for application. Overall I feel like things have been progressing well but we do need to do something to get this to close more readily. 08/24/18 patient seen today for reevaluation after having had the total contact cast applied on Tuesday. She seems to have done very well the wound appears to be doing great and overall I'm pleased with the progress that she's made. There were no abnormal areas of rubbing from the cast on her lower extremity. 08/30/18 on evaluation today patient actually appears to be completely healed in regard to her plantar toe ulcer. She tells me at this  point she's been having a lot of issues with the cast. She almost fell a couple of times the state shall the step of her dog a couple times as well. This is been a very frustrating process for her other nonetheless she has completely healed the wound which is excellent news. Overall there does not appear to be the evidence of infection at this time which is great news. 09/11/18 evaluation today patient presents for follow-up concerning her great toe ulcer on the left which has unfortunately reopened since I last saw her which was only a couple of weeks ago. Unfortunately she was not able to get in to get the shoe and potentially the AFO that's gonna be necessary due to her left foot drop. She continues with offloading shoe but this is not enough to prevent her from reopening it appears. When we last had her in the total contact cast she did well from a healing standpoint but unfortunately the wound reopened as soon as she came out of the cast within just a couple of weeks. 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 This is also part of the reason why she falls on a regular basis. I just do not believe that is gonna be safe for [place] to reinitiate the total contact cast as last time we had this on she fell 3 times one week which is definitely not normal for her. 09/18/18 upon evaluation today the patient actually appears to be doing about the same in regard to her toe ulcer. She did not contact [employer] as I asked her to even though I had given her the prescription. In fact she actually states that she has no idea where the prescription is. She did apparently call [facility] and they told her that all she needed to do was bring the prescription in order to be able to be seen and work on getting the AFO for her  left foot. With all that being said she still does not have an appointment and I'm not sure were things stand that regard. I will give her a new prescription today in order to contact them to get this set up. 09/25/18 on evaluation today patient actually appears to be doing about the same in regard to her toes ulcer. She does have a small areas which seems to have a lot of callous buildup around the edge of the wound which is going to need sharp debridement today. She still is waiting to be scheduled for evaluation with [facility] for possibility of an AFO. She states there supposed to call her tomorrow to  get this set up. Unfortunately it does appear that her foot specifically the toe area is showing signs of erythema. There does not appear to be any systemic infection which is in these good news. SHADEA, INGERMAN A. ([id number]) 10/02/18 on evaluation today patient actually appears to be doing about the same in regard to her toe ulcer. This really has not done too well although it's not significantly larger it's also not significantly smaller. She has been tolerating the dressing changes without complication. She actually has her appointment with [facility] and [place] tomorrow to hopefully be measured for obtaining and AFO splint. I think this would be helpful preventing this from reoccurring. We had contemplated starting the cast this week although to be honest I am reluctant to do that as she's been having nausea, vomiting, and seizure activity over the past three days. She has a history of seizures and have been told is nothing that can be done for these. With that being said I do believe that along with the seizures have the nausea vomiting which upon further questioning doesn't seem to be the normal for her and makes me concerned for the possibility of infection or something else going on. I discussed this with the patient and her mother during the office visit today. I do not feel the wound is  effective but maybe something else. The responses this was "this just happens to her at times and we don't know why". They did not seem to be interested in going to the hospital to have this checked out further. 10/09/18 on evaluation today patient presents for follow-up concerning her ongoing toe ulcer. She has been tolerating the dressing changes without complication. Fortunately there does not appear to be any evidence of infection which is great news however I do think that the patient would benefit from going ahead for with the total contact cast. She's actually in a wheelchair today she tells me that she will use her walker if we initiate the cast. I was very specific about the fact that if we were gonna do the cast I wanted to make sure that she was using the walker in order to prevent any falls. She tells me she does not have stairs that she has to traverse on a regular basis at her home. She has not had any seizures since last week again that something that happens to her often she tells me she did talk to [employer] and they said that it may take up to three weeks to get the brace approved for her. Hopefully that will not take that long but nonetheless in the meantime I do think the cast could be of benefit. 10/12/18 on evaluation today patient appears to be doing rather well in regard to her toe ulcer. It's just been a few days and already this is significantly improved both as far as overall appearance and size. Fortunately there's no sign of infection. She is here for her first obligatory cast change. 10/19/18 Seen today for follow up and management of left great toe ulcer. Wound continues to show improvement. Noted small open area with seroussang drainage with palpation. Denies any increased pain or recent fevers during visit. She will continue calcium alginate with offloading shoe. Denies any questions or concerns during visit. 10/26/18 on evaluation today patient appears to be doing about  the same as when I last saw her in regard to her wound bed. Fortunately there does not appear to be any signs of infection. Unfortunately she continues to  have a breakdown in regard to the toe region any time that she is not in the cast. It takes almost no time at all for this to happen. Nonetheless she still has not heard anything from the brace being made by [employer] as to when exactly this will be available to her. Fortunately there is no signs of infection at this time. 10/30/18 on evaluation today patient presents for application of the total contact cast as we just received him this morning. Fortunately we are gonna be able to apply this to her today which is great news. She continues to have no significant pain which is good news. Overall I do feel like things have been improving while she was the cast is when she doesn't have a cast that things get worse. She still has not really heard anything from [place] regarding her brace. 11/02/18 upon evaluation today patient's wound already appears to be doing significantly better which is good news. Fortunately there does not appear to be any signs of infection also good news. Overall I do think the total contact cast as before is helping to heal this area unfortunately it's just not gonna likely keep the area closed and healed without her getting her brace at least. Again the foot drop is a significant issue for her. 11/09/18 on evaluation today patient appears to be doing excellent in regard to her toe ulcer which in fact is completely healed. Fortunately we finally got the situation squared away with the paperwork which was needed to proceed with getting her brace approved by Medicaid. I have filled that out unfortunately that information has been sent to the orthopedic office that I worked at 2 1/2 years ago and not tired Current wound care measures. Fortunately she seems to be doing very well at this time. 11/23/18 on evaluation today patient appears  to be doing More Poorly Compared to Last Time I Saw Her. At [facility] She Had Completely Healed. Currently she is continuing to have issues with reopening. She states that she just found out that the brace was approved through Medicaid now she just has to go get measured in order to have this fitted for her and then made. Subsequently she does not have an appointment for this yet that is going to complicate things we obviously cannot put her back in the cast if we do not have everything measured because they're not gonna be able to measure her foot while she is in the cast. 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 Obviously this is unfortunate and does have me somewhat worried as well. 11/30/18 on evaluation today patient's toe ulcer actually appears to be doing fairly well. The good news is she will be getting her brace in the shoes next week on Wednesday. Hopefully we will be able to get this to heal without having to go back in the cast however she may need the cast in order to get the wound completely heal and then go from there. Fortunately there's no signs of infection at this time. 12/07/18 on evaluation today patient fortunately did receive her brace and she states she could tell this definitely makes her walk better. With that being said she's been having issues with her toe where she noticed yesterday there was a lot of tissue that was loosing off this appears to be much larger than what it was previous. She  also states that her leg has been read putting much across the top of her foot just about the ankle although this seems to be receiving somewhat. The total area is still red and appears to be someone infected as best I can tell. She is previously taken Bactrim and that may be a good option for her today as well. We are gonna see what I wound culture shows as well and I think that this  is definitely appropriate. With that being said outside of the culture I still need to initiate something in the interim and that's what I'm gonna go ahead and select Bactrim is a good option for her. 12/14/18 on evaluation today patient appears to be doing better in regard to her left great toe ulcer as compared to last week's evaluation. There's still some erythema although this is significantly improved which is excellent news. Overall I do believe that she is making good progress is still gonna take some time before she is where I would like her to be from the standpoint of being able to place her back into the total contact cast. Hopefully we will be where we need to be by next week. 12/21/18 on evaluation today patient actually appears to be doing poorly in regard to her toe ulcer. She's been tolerating the dressing changes without complication. Fortunately there's no signs of systemic infection although she does have a lot of drainage from the toe ulcer and this does seem to be causing some issues at this point. She does have erythema on the distal portion of her toe that appears to be likely cellulitis. 12/28/18 on evaluation today patient actually appears to be doing a little better in my pinion in regard to her toe ulcer. With that being said she still does have some evidence of infection at this time and for her culture she had both E. coli as well as enterococcus as organisms noted on evaluation. For that reason I think that though the Keflex likely has treated the E. coli rather well this has really done nothing for the enterococcus. We are going to have to initiate treatment for this specifically. LOETA, EICHEL A. ([id number]) 01/04/19 on evaluation today patient's toe actually appears to be doing better from the standpoint of infection. She currently would like to see about putting the cash back on I think that this is appropriate as long as she takes care of it and keeps it from getting  wet. She is gonna have some drainage we can definitely pass this up with Drawtex and alginate to try to prevent as much drainage as possible from causing the problems. With that being said I do want to at least try her with the cast between now and Tuesday. If there any issues we can't continue to use it then I will discontinue the use of the cast at that point. 01/08/19 on evaluation today patient actually appears to be doing very well as far as her foot ulcer specifically the great toe on the left is concerned. She did have an area of rubbing on the medial aspect of her left ankle which again is from the cast. Fortunately there's no signs of infection at this point in this appears to be a very slight skin breakdown. The patient tells me she felt it rubbing but didn't think it was that bad. Fortunately there is no signs of active infection at this time which is good news. No fevers, chills, nausea, or vomiting noted at this time. 01/15/19  on evaluation today patient actually appears to be doing well in regard to her toe ulcer. Again as previous she seems to do well and she has the cast on which indicates to me that during the time she doesn't have a cast on she's putting way too much pressure on this region. Obviously I think that's gonna be an issue as with the current national emergency concerning the Covid-19 Virus it has been recommended that we discontinue the use of total contact casting by the chief medical officer of our company, Dr. Simona Huh. The reasoning is that if a patient becomes sick and cannot come into have the cast removed they could not just leave this on for an additional two weeks. Obviously the hospitals also do not want to receive patient's who are sick into the emergency department to potentially contaminate the region and spread the Covid-19 Virus among other sick individuals within the hospital system. Therefore at this point we are suspending the use of total contact cast until  the current emergency subsides. This was all discussed with the patient today as well. 01/22/19 on evaluation today patient's wound on her left great toe appears to be doing slightly worse than previously noted last week. She tells me that she has been on this quite a bit in fact she tells me she's been awake for 38 straight hours. This is due to the fact that she's having to care for grandparents because nobody else will. She has been taking care of them for five the last seven days since I've seen her they both have dementia his is from a stroke and her grandmother's was progressive. Nonetheless she states even her mom who knows her condition and situation has only help two of those days to take care of them she's been taking care of the rest. Fortunately there does not appear to be any signs of active infection in regard to her toe at this point although obviously it doesn't look as good as it did previous. I think this is directly related to her not taking off the pressure and friction by way of taking things easy. Though I completely understand what's going on. 01/29/19 on evaluation today patient's tools are actually appears to be showing some signs of improvement today compared to last week's evaluation as far as not necessarily the overall size of the wound but the fact that she has some new skin growth in between the two ends of the wound opening. Overall I feel like she has done well she states that she had a family member give her what sounds to be a CAM walker boot which has been helpful as well. 02/05/19 on evaluation today patient's wound bed actually appears to be doing significantly better in regard to her overall appearance of the size of the wound. With that being said she is still having an issue with offloading efficiently enough to get this to close. Apparently there is some signs of infection at this point as well unfortunately. Previously she's done well of Augmentin I really do not  see anything that needs to be culture currently but there theme and cellulitis of the foot that I'm seeing I'm gonna go ahead and place her on an antibiotic today to try to help clear this up. 02/12/2019 on evaluation today patient actually appears to be doing poorly in regard to her overall wound status. She tells me she has been using her offloading shoe but actually comes in today wearing her tennis shoe with the  AFO brace. Again as I previously discussed with her this is really not sufficient to allow the area to heal appropriately. Nonetheless she continues to be somewhat noncompliant and I do wonder based on what she has told my nurse in the past as to whether or not a good portion of this noncompliance may be recreational drug and alcohol related. She has had a history of heroin overdose and this was fairly recently in the past couple of months that have been seeing her. Nonetheless overall I feel like her wound looks significantly worse today compared to what it was previous. She still has significant erythema despite the Augmentin I am not sure that this is an appropriate medication for her infection I am also concerned that the infection may have gone down into her bone. 02/19/19 on evaluation today patient actually appears to be doing about the same in regard to her toe ulcer. Unfortunately she continues to show signs of bone exposure and infection at this point. There does not appear to be any evidence of worsening of the infection but I'm also not really sure that it's getting significantly better. She is on the Augmentin which should be sufficient for the Staphylococcus aureus infection that she has at this point. With that being said she may need IV antibiotics to more appropriately treat this. We did have a discussion today about hyperbaric option therapy. 02/28/19 on evaluation today patient actually appears to be doing much worse in regard to the wound on her left great toe as compared  to even my previous evaluation last week. Unfortunately this seems to be training in a pretty poor direction. Her toe was actually now starting to angle laterally and I can actually see the entire joint area of the proximal portion of the digit where is the distal portion of the digit again is no longer even in contact with the joint line. Unfortunately there's a lot more necrotic tissue around the edge and the toe appears to be showing signs of becoming gangrenous in my pinion. I'm very concerned about were things stand at this point. She did see infectious disease and they are planning to send in a prescription for Sivextro for her and apparently this has been approved. With that being said I don't think she should avoid taking this but at the same time I'm not sure that it's gonna be sufficient to save her toe at this point. 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 03/05/19 on evaluation today patient actually appears to be doing quite well in regard to her toe all things considering. She still has bone exposed but there appears to be much less your thing on overall the appearance of the wound and the toe itself is dramatically improved. She still does have some issues currently obviously with infection she did see vascular as well and there concerned that her blood flow to the toad. For that reason they are setting up for an angiogram next week. 03/14/19 on evaluation today patient appears to be doing very poor in regard to her toe and specifically in regard to the ulceration and the fact that she's starting to notice the toe was leaning even more towards the lateral aspect and the complete joint is visible on the proximal aspect of the  joint. Nonetheless she's also noted a significant odor and  the tip of the toe is turning more dark and necrotic appearing. Overall I think she is getting worse not better as far as this is concerned. For that reason I am recommending at this point that she likely needs to be seen for likely amputation. READMISSION 03/19/2021 This is a patient that we cared for in this clinic for a prolonged period of time in 2019 and 2020 with a left foot and left first toe wound. 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 ([id number]) 04/09/20 by Dr. Luana Shu. In December 2021 she had an ulcer on her right great toe as well as the fourth and fifth toes. 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 She also had an angiogram at that time and underwent angioplasty of the right anterior tibial artery. In any case she claims that the wound on the right foot is closed I did not look at this today which was probably an oversight although I think that should be done next week. After her surgery she developed a dehiscence but I do not see any follow-up of this. According to Dr. Deborra Medina last review that she was out of the area being cared for by another physician but recently came back to his attention. 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 A culture of this area showed E. coli apparently before she came back to see Dr. Luana Shu she was supposed to be receiving antibiotics but she did not really take them. Nor is she offloading this area at all. Finally her last hemoglobin A1c listed in epic was in March 2022 at 14.1 she says things are a lot better since then although I am not sure. 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 She had this wound at the time. During this admission she had E. coli sepsis a TEE was negative. Past medical  history is extensive and includes type 2 diabetes with peripheral neuropathy cardiomyopathy with an ejection fraction of 33%, hypertension, hyperlipidemia chronic renal failure stage III history of substance abuse with cocaine although she claims to be clean now verified by her mother. She is still a heavy cigarette smoker. She has a history of bipolar disorder seizure disorder ABI in our clinic was 1.05 6/1; left midfoot in the setting of a TMA done previously. Round circular wound with a "knuckle" of protruding tissue. The problem is that the knuckle was not attached to any of the surrounding granulation and this probed proximally widely I removed a large portion of this tissue. This wound goes with considerable undermining laterally. I do not feel any bone there was no purulence but this is a deep wound. 6/8; in spite of the debridement I did last week. She arrives with a wound looking exactly the same. A protruding "knuckle" of tissue nonadherent to most of the surrounding tissue. There is considerable depth around this from 6-12 o'clock at 2.7 cm and undermining of 1 cm. This does not look overtly infected and the x-ray I did last week was negative for any osseous abnormalities. We have been using silver collagen 6/15; deep tissue culture I did last week showed moderate staph aureus and moderate Pseudomonas. This will definitely require prolonged antibiotic therapy. The pathology on the protuberant area  was negative for malignancy fungus etc. 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 We have been using silver collagen. I am going to be prescribing Levaquin for 2 weeks. Her CT scan of the foot is down for 7/5 6/22; CT scan of the foot on 7 5. She says she has hardware in the left leg from her previous fracture. She is on the Levaquin for the deep tissue culture I did that showed methicillin sensitive staph aureus and Pseudomonas. I gave  her a 2-week supply and she will have another week. She arrives in clinic today with the same protuberant tissue however this is nonadherent to the tissue surrounding it. I am really at a loss to explain this unless there is underlying deep tissue infection 6/29; patient presents for 1 week follow-up. She has been using collagen to the wound bed. She reports taking her antibiotics as prescribed.She has no complaints or issues today. She denies signs of infection. 7/6; patient presents for one week followup. She has been using collagen to the wound bed. She states she is taking Levaquin however at times she is not able to keep it down. She denies signs of infection. 7/13; patient presents for 1 week follow-up. She has been using silver alginate to the wound bed. She still has nausea when taking her antibiotics. She denies signs of infection. 7/20; patient presents for 1 week follow-up. She has been using silver alginate with gentamicin cream to the wound bed. She denies any issues and has no complaints today. She denies signs of infection. 7/27; patient presents for 1 week follow-up. She continues to use silver alginate with gentamicin cream to the wound bed. She reports starting her antibiotics. She has no issues or complaints. Overall she reports stability to the wound. 8/3; patient presents for 1 week follow-up. She has been using silver alginate with gentamicin cream to the wound bed. She reports completing all antibiotics. She has no issues or complaints today. She denies signs of infection. 8/17; patient presents for 2-week follow-up. He is to use silver alginate to the wound bed. She has no issues or complaints today. She denies signs of infection. She reports her pain has improved in her foot since last clinic visit 8/24; patient presents for 1 week follow-up. She continues to use silver alginate to the wound bed. She has no issues or complaints. She denies signs of infection. Pain is  stable. 9/7; patient presents for follow-up. She missed her last week appointment due to feeling ill. She continues to use silver alginate. She has a new wound to the right lower extremity that is covered in eschar. She states It occurred over the past week and has no idea how it started. She currently denies signs of infection. 9/14; patient presents for follow-up. To the left foot wound she has been using gentamicin cream and silver alginate. To the right lower extremity wound she has been keeping this covered and has not obtain Santyl. 9/21; patient presents for follow-up. She reports using gentamicin cream and silver alginate to the left foot and Santyl to the right lower extremity wound. She has no issues or complaints today. She denies signs of infection. 9/28; patient presents for follow-up. She reports a new wound to her right heel. She states this occurred a few days ago and is progressively gotten worse. She has been trying to clean the area with a Q-tip and Santyl. She reports stability in the other 2 wounds. She has  been using gentamicin cream and silver alginate to the left foot and Santyl to the right lower extremity wound. 10/12; patient presents for follow-up. She reports improvement to the wound beds. She is seeing vein and vascular to discuss the potential of a left BKA. She states they are going to do an arteriogram. She continues to use silver alginate with dressing changes to her wounds. Electronic Signature(s) Signed: 08/04/2021 12:23:03 PM By: Kalman Shan DO Entered By: Kalman Shan on 08/04/2021 12:15:47 [name] ([id number]) -------------------------------------------------------------------------------- Physical Exam Details Patient Name: [name] Date of Service: 08/04/2021 10:45 AM Medical Record Number: [id number] Patient Account Number: [id number] Date of Birth/Sex: 23-Feb-1976 (45 y.o. F) Treating RN: Dolan Amen Primary Care Provider:  Raelene Bott Other Clinician: Referring Provider: Raelene Bott Treating Provider/Extender: Yaakov Guthrie in Treatment: 90 Constitutional . Cardiovascular . Psychiatric . Notes Left foot (Transmetatarsal amputation): Wound to the plantar aspect that has granulation tissue and nonviable tissue present. Right lower extremity: To the anterior aspect there is an open wound with Granulation tissue and scant nonviable tissue present. Right foot: To the heel there is an open wound with Granulation tissue present. No undermining noted. Electronic Signature(s) Signed: 08/04/2021 12:23:03 PM By: Kalman Shan DO Entered By: Kalman Shan on 08/04/2021 12:17:16 [name] ([id number]) -------------------------------------------------------------------------------- Physician Orders Details Patient Name: [name] Date of Service: 08/04/2021 10:45 AM Medical Record Number: [id number] Patient Account Number: [id number] Date of Birth/Sex: [birth year] (45 y.o. F) Treating RN: Dolan Amen Primary Care Provider: Raelene Bott Other Clinician: Referring Provider: Raelene Bott Treating Provider/Extender: Yaakov Guthrie in Treatment: 85 Verbal / Phone Orders: No Diagnosis Coding ICD-10 Coding Code Description 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ity S81.801D Unspecified open wound, right lower leg, subsequent encounter E11.621 Type 2 diabetes mellitus with foot ulcer E11.42 Type 2 diabetes mellitus with diabetic polyneuropathy S91.301A Unspecified open wound, right foot, initial encounter Follow-up Appointments o Return Appointment in 1 week. Bathing/ Shower/ Hygiene o Clean wound with Normal Saline or wound cleanser. Off-Loading Left Lower Extremity o Open toe surgical shoe with peg assist. o Turn and reposition every 2 hours Medications-Please add to medication list. o P.O.  Antibiotics Wound Treatment Wound #7 - Foot Wound Laterality: Plantar, Left, Midline Cleanser: Soap and Water 1 x Per Day/30 Days Discharge Instructions: Gently cleanse wound with antibacterial soap, rinse and pat dry prior to dressing wounds Topical: Gentamicin 1 x Per Day/30 Days Discharge Instructions: Apply to wound bed and tunnel area under the dressing Topical: Santyl Collagenase Ointment, 30 (gm), tube 1 x Per Day/30 Days Discharge Instructions: Apply nickel thick to wound bed IN OFFICE ONLY Primary Dressing: Silvercel Small 2x2 (in/in) 1 x Per Day/30 Days Discharge Instructions: Apply to wound bed Secondary Dressing: ABD Pad 5x9 (in/in) 1 x Per Day/30 Days Discharge Instructions: Cover with ABD pad on top of dressing Secondary Dressing: Gauze 1 x Per Day/30 Days Discharge Instructions: To hold dressing in place Secured With: Kerlix Roll Sterile or Non-Sterile 6-ply 4.5x4 (yd/yd) 1 x Per Day/30 Days Discharge Instructions: Apply Kerlix as directed Wound #8 - Lower Leg Wound Laterality: Right, Anterior Cleanser: Normal Saline 1 x Per Day/30 Days Discharge Instructions: Wash your hands with soap and water. Remove old dressing, discard into plastic bag and place into trash. Cleanse the wound with Normal Saline prior to applying a clean dressing using gauze sponges, not tissues or cotton balls. Do not scrub or use excessive force. Pat dry  using gauze sponges, not tissue or cotton balls. Topical: Santyl Collagenase Ointment, 30 (gm), tube 1 x Per Day/30 Days Discharge Instructions: apply nickel thick to wound bed only Ruderman, Analyce A. ([id number]) Secondary Dressing: Cowden Dressing, 4x4 (in/in) 1 x Per Day/30 Days Discharge Instructions: Apply over dressing to secure in place. Wound #9 - Calcaneus Wound Laterality: Right Cleanser: Soap and Water 1 x Per Day/30 Days Discharge Instructions: Gently cleanse wound with antibacterial soap, rinse and pat dry prior to dressing  wounds Primary Dressing: Silvercel Small 2x2 (in/in) 1 x Per Day/30 Days Discharge Instructions: Apply to wound bed Secondary Dressing: ABD Pad 5x9 (in/in) 1 x Per Day/30 Days Discharge Instructions: Cover with ABD pad on top of dressing Secondary Dressing: Gauze 1 x Per Day/30 Days Discharge Instructions: To hold dressing in place Secured With: Kerlix Roll Sterile or Non-Sterile 6-ply 4.5x4 (yd/yd) 1 x Per Day/30 Days Discharge Instructions: Apply Kerlix as directed Electronic Signature(s) Signed: 08/04/2021 2:18:07 PM By: Dolan Amen RN Signed: 08/04/2021 2:51:01 PM By: Kalman Shan DO Entered By: Dolan Amen on 08/04/2021 14:18:06 Mears, Kanasia A. ([id number]) -------------------------------------------------------------------------------- Problem List Details Patient Name: [name] Date of Service: 08/04/2021 10:45 AM Medical Record Number: [id number] Patient Account Number: [id number] Date of Birth/Sex: 1976/03/04 (45 y.o. F) Treating RN: Dolan Amen Primary Care Provider: Raelene Bott Other Clinician: Referring Provider: Raelene Bott Treating Provider/Extender: Yaakov Guthrie in Treatment: 58 Active Problems ICD-10 Encounter Code Description Active Date MDM Diagnosis 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 03/19/2021 No Yes severity S81.801D Unspecified open wound, right lower leg, subsequent encounter 07/07/2021 No Yes E11.621 Type 2 diabetes mellitus with foot ulcer 03/19/2021 No Yes E11.42 Type 2 diabetes mellitus with diabetic polyneuropathy 03/19/2021 No Yes S91.301A Unspecified open wound, right foot, initial encounter 07/21/2021 No Yes Inactive Problems ICD-10 Code Description Active Date Inactive Date S81.801A Unspecified open wound, right lower leg, initial encounter 06/30/2021 06/30/2021 Resolved Problems Electronic Signature(s) Signed: 08/04/2021 12:23:03 PM By: Kalman Shan DO Entered  By: Kalman Shan on 08/04/2021 12:14:28 [name] ([id number]) -------------------------------------------------------------------------------- Progress Note Details Patient Name: [name] Date of Service: 08/04/2021 10:45 AM Medical Record Number: [id number] Patient Account Number: [id number] Date of Birth/Sex: April 05, 1976 (45 y.o. F) Treating RN: Dolan Amen Primary Care Provider: Raelene Bott Other Clinician: Referring Provider: Raelene Bott Treating Provider/Extender: Yaakov Guthrie in Treatment: 57 Subjective Chief Complaint Information obtained from Patient 03/19/2021; patient referred by Dr. Luana Shu who has been looking after her left foot for quite a period of time for review of a nonhealing area in the left midfoot History of Present Illness (HPI) 01/18/18-She is here for initial evaluation of the left great toe ulcer. She is a poor historian in regards to timeframe in detail. She states approximately 4 weeks ago she lacerated her toe on something in the house. She followed up with her primary care who placed her on Bactrim and ultimately a second dose of Bactrim prior to coming to wound clinic. She states she has been treating the toe with peroxide, Betadine and a Band-Aid. She did not check her blood sugar this morning but checked it yesterday morning it was 327; she is unaware of a recent A1c and there are no current records. She saw Dr. she would've orthopedics last week for an old injury to the left ankle, she states he did not see her toe, nor did she bring it to his attention. She smokes approximately 1 pack cigarettes  a day. Her social situation is concerning, she arrives this morning with her mother who appears extremely intoxicated/under the influence; her mother was asked to leave the room and be monitored by the patient's grandmother. The patient's aunt then accompanied the patient and the room throughout the rest of the appointment. We had a  lengthy discussion regarding the deleterious effects of uncontrolled hyperglycemia and smoking as it relates to wound healing and overall health. She was strongly encouraged to decrease her smoking and get her diabetes under better control. She states she is currently on a diet and has cut down her [facility] consumption. The left toe is erythematous, macerated and slightly edematous with malodor present. The edema in her left foot is below her baseline, there is no erythema streaking. We will treat her with Santyl, doxycycline; we have ordered and xray, culture and provided a Peg assist surgical shoe and cultured the wound. 01/25/18-She is here in follow-up evaluation for a left great toe ulcer and presents with an abscess to her suprapubic area. She states her blood sugars remain elevated, feeling "sick" and if levels are below 250, but she is trying. She has made no attempt to decrease her smoking stating that we "can't take away her food in her cigarettes". She has been compliant with offloading using the PEG assist you. She is using Santyl daily. the culture obtained last week grew staph aureus and Enterococcus faecalis; continues on the doxycycline and Augmentin was added on Monday. The suprapubic area has erythema, no femoral variation, purple discoloration, minimal induration, was accessed with a cotton tip applicator with sanguinopurulent drainage, this was cultured, I suspect the current antibiotic treatment will cover and we will not add anything to her current treatment plan. She was advised to go to urgent care or ER with any change in redness, induration or fever. 02/01/18-She is here in follow-up evaluation for left great toe ulcers and a new abdominal abscess from last week. She was able to use packing until earlier this week, where she "forgot it was there". She states she was feeling ill with GI symptoms last week and was not taking her antibiotic. She states her glucose levels have  been predominantly less than 200, with occasional levels between 200-250. She thinks this was contributing to her GI symptoms as they have resolved without intervention. There continues to be significant laceration to left toe, otherwise it clinically looks stable/improved. There is now less superficial opening to the lateral aspect of the great toe that was residual blister. We will transition to [facility] to all wounds, she will continue her Augmentin. If there is no change or deterioration next week for reculture. 02/08/18-She is here in follow-up evaluation for left great toe ulcer and abdominal ulcer. There is an improvement in both wounds. She has been wrapping her left toe with coban, not by our direction, which has created an area of discoloration to the medial aspect; she has been advised to NOT use coban secondary to her neuropathy. She states her glucose levels have been high over this last week ranging from 200-350, she continues to smoke. She admits to being less compliant with her offloading shoe. We will continue with same treatment plan and she will follow-up next week. 02/15/18-She is here in follow-up evaluation for left great toe ulcer and abdominal ulcer. The abdominal ulcer is epithelialized. The left great toe ulcer is improved and all injury from last week using the Coban wrap is resolved, the lateral ulcer is healed. She  admits to noncompliance with wearing offloading shoe and admits to glucose levels being greater than 300 most of the week. She continues to smoke and expresses no desire to quit. There is one area medially that probes deeper than it has historically, erythema to the toe and dorsal foot has consistently waxed and waned. There is no overt signs of cellulitis or infection but we will culture the wound for any occult infection given the new area of depth and erythema. We will hold off on sensitivities for initiation of antibiotic therapy. 02/22/18-She is here in  follow up evaluation for left great toe ulcer. There is overall significant improvement in both wound appearance, erythema and edema with changes made last week. She was not initiated on antibiotic therapy. Culture obtained last week showed oxacillin sensitive staph aureus, sensitive to clindamycin. Clindamycin has been called into the pharmacy but she has been instructed to hold off on initiation secondary to overall clinical improvement and her history of antibiotic intolerance. She has been instructed to contact the clinic with any noted changes/deterioration and the wound, erythema, edema and/or pain. She will follow-up next week. She continues to smoke and her glucose levels remain elevated >250; she admits to compliance with offloading shoe 03/01/18 on evaluation today patient appears to be doing fairly well in regard to her left first toe ulcer. She has been tolerating the dressing changes with the [facility] Dressing without complication and overall this has definitely showed signs of improvement according to records as well is what the patient tells me today. I'm very pleased in that regard. She is having no pain today 03/08/18 She is here for follow up evaluation of a left great toe ulcer. She remains non-compliant with glucose control and smoking cessation; glucose levels consistently >200. She states that she got new shoe inserts/peg assist. She admits to compliance with offloading. Since my last evaluation there is significant improvement. We will switch to prisma at this time and she will follow up next week. She is noted to be tachycardic at this appointment, heart rate 120s; she has a history of heart rate 70-130 according to our records. She admits to extreme agitation r/t personal issues; she was advised to monitor her heartrate and contact her physician if it does not return to a more normal range (<100). She takes cardizem twice daily. 03/15/18-She is here in follow-up evaluation  for left great toe ulcer. She remains noncompliant with glucose control and smoking cessation. She admits to compliance with wearing offloading shoe. The ulcer is improved/stable and we will continue with the same treatment plan and she will follow-up next week 03/22/18-She is here for evaluation for left great toe ulcer. There continues to be significant improvement despite recurrent hyperglycemia (over 500 yesterday) and she continues to smoke. She has been compliant with offloading and we will continue with same treatment plan and she will follow-up next week. Fritzler, Cayden A. ([id number]) 03/29/18-She is here for evaluation for left great toe ulcer. Despite continuing to smoke and uncontrolled diabetes she continues to improve. She is compliant with offloading shoe. We will continue with the same treatment plan and she will follow-up next week 04/05/18- She is here in follow up evaluation for a left great toe ulcer; she presents with small pustule to left fifth toe (resembles ant bite). She admits to compliance with wearing offloading shoe; continues to smoke or have uncontrolled blood glucose control. There is more callus than usual with evidence of bleeding; she denies known trauma. 04/12/18-She is  here for evaluation of left great toe ulcer. Despite noncompliance with glycemic control and smoking she continues to make improvement. She continues to wear offloading shoe. The pustule, that was identified last week, to the left fifth toe is resolved. She will follow-up in 2 weeks 05/03/18-she is seen in follow-up evaluation for a left great toe ulcer. She is compliant with offloading, otherwise noncompliant with glycemic control and smoking. She has plateaued and there is minimal improvement noted. We will transition to [facility], replaced the insert to her surgical shoe and she will follow-up in one week 05/10/18- She is here in follow up evaluation for a left great toe ulcer. It appears stable  despite measurement change. We will continue with same treatment plan and follow up next week. 05/24/18-She is seen in follow-up evaluation for a left great toe ulcer. She remains compliant with offloading, has made significant improvement in her diet, decreasing the amount of sugar/soda. She said her recent A1c was 10.9 which is lower than. She did see a diabetic nutritionist/educator yesterday. She continues to smoke. We will continue with the same treatment plan and she'll follow-up next week. 05/31/18- She is seen in follow-up evaluation for left great toe ulcer. She continues to remain compliant with offloading, continues to make improvement in her diet, increasing her water and decreasing the amount of sugar/soda. She does continue to smoke with no desire to quit. We will apply Prisma to the depth and Hydrofera Blue over. We have not received insurance authorization for oasis. She will follow up next week. 06/07/18-She is seen in follow-up evaluation for left great toe ulcer. It has stalled according to today's measurements although base appears stable. She says she saw a diabetic educator yesterday; her average blood sugars are less than 300 which is an improvement for her. She continues to smoke and states "that's my next step" She continues with water over soda. We will order for xray, culture and reinstate ace wrap compression prior to placing apligraf for next week. She is voicing no complaints or concerns. Her dressing will change to iodoflex over the next week in preparation for apligraf. 06/14/18-She is seen in follow-up evaluation for left great toe ulcer. Plain film x-ray performed last week was negative for osteomyelitis. Wound culture obtained last week grew strep B and OSSA; she is initiated on keflex and cefdinir today; there is erythema to the toe which could be from ace wrap compression, she has a history of wrapping too tight and has has been encouraged to maintain ace wraps that we  place today. We will hold off on application of apligraf today, will apply next week after antibiotic therapy has been initiated. She admits today that she has resumed taking a shower with her foot/toe submerged in water, she has been reminded to keep foot/toe out of the bath water. She will be seen in follow up next week 06/21/18-she is seen in follow-up evaluation for left great toe ulcer. She is tolerating antibiotic therapy with no GI disturbance. The wound is stable. Apligraf was applied today. She has been decreasing her smoking, only had 4 cigarettes yesterday and 1 today. She continues being more compliant in diabetic diet. She will follow-up next week for evaluation of site, if stable will remove at 2 weeks. 06/28/18- She is here in follow up evalution. Apligraf was placed last week, she states the dressing fell off on Tuesday and she was dressing with hydrofera blue. She is healed and will be discharged from the clinic today.  She has been instructed to continue with smoking cessation, continue monitoring glucose levels, offloading for an additional 4 weeks and continue with hydrofera blue for additional two weeks for any possible microscopic opening. Readmission: 08/07/18 on evaluation today patient presents for reevaluation concerning the ulcer of her right great toe. She was previously discharged on 06/28/18 healed. Nonetheless she states that this began to show signs of drainage she subsequently went to her primary care provider. Subsequently an x-ray was performed on 08/01/18 which was negative. The patient was also placed on antibiotics at that time. Fortunately they should have been effective for the infection. Nonetheless she's been experiencing some improvement but still has a lot of drainage coming from the wound itself. 08/14/18 on evaluation today patient's wound actually does show signs of improvement in regard to the erythema at this point. She has completed the antibiotics. With  that being said we did discuss the possibility of placing her in a total contact cast as of today although I think that I may want to give this just a little bit more time to ensure nothing recurrence as far as her infection is concerned. I do not want to put in the cast and risk infection at that time if things are not completely resolved. With that being said she is gonna require some debridement today. 08/21/18 on evaluation today patient actually appears to be doing okay in regard to her toe ulcer. She's been tolerating the dressing changes without complication. With that being said it does appear that she is ready and in fact I think it's appropriate for [place] to go ahead and initiate the total contact cast today. Nonetheless she will require some sharp debridement to prepare the wound for application. Overall I feel like things have been progressing well but we do need to do something to get this to close more readily. 08/24/18 patient seen today for reevaluation after having had the total contact cast applied on Tuesday. She seems to have done very well the wound appears to be doing great and overall I'm pleased with the progress that she's made. There were no abnormal areas of rubbing from the cast on her lower extremity. 08/30/18 on evaluation today patient actually appears to be completely healed in regard to her plantar toe ulcer. She tells me at this point she's been having a lot of issues with the cast. She almost fell a couple of times the state shall the step of her dog a couple times as well. This is been a very frustrating process for her other nonetheless she has completely healed the wound which is excellent news. Overall there does not appear to be the evidence of infection at this time which is great news. 09/11/18 evaluation today patient presents for follow-up concerning her great toe ulcer on the left which has unfortunately reopened since I last saw her which was only a couple of  weeks ago. Unfortunately she was not able to get in to get the shoe and potentially the AFO that's gonna be necessary due to her left foot drop. She continues with offloading shoe but this is not enough to prevent her from reopening it appears. When we last had her in the total contact cast she did well from a healing standpoint but unfortunately the wound reopened as soon as she came out of the cast within just a couple of weeks. 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 This is also part of the reason why she falls on a regular basis. I just do not believe that is gonna be safe for [place] to reinitiate the total contact cast as last time we had this on she fell 3 times one week which is definitely not normal for her. 09/18/18 upon evaluation today the patient actually appears to be doing about the same in regard to her toe ulcer. She did not contact [employer] as I asked her to even though I had given her the prescription. In fact she actually states that she has no idea where the prescription is. She did apparently call [employer] and they told her that all she needed to do was bring the prescription in order to be able to be seen and work on getting the AFO for her left foot. With all that being said she still does not have an appointment and I'm not sure were things stand that regard. I will give her a new prescription today in order to contact them to get this set up. KHAMORA, ZASKE A. ([id number]) 09/25/18 on evaluation today patient actually appears to be doing about the same in regard to her toes ulcer. She does have a small areas which seems to have a lot of callous buildup around the edge of the wound which is going to need sharp debridement today. She still is waiting to be scheduled for evaluation with  [employer] for possibility of an AFO. She states there supposed to call her tomorrow to get this set up. Unfortunately it does appear that her foot specifically the toe area is showing signs of erythema. There does not appear to be any systemic infection which is in these good news. 10/02/18 on evaluation today patient actually appears to be doing about the same in regard to her toe ulcer. This really has not done too well although it's not significantly larger it's also not significantly smaller. She has been tolerating the dressing changes without complication. She actually has her appointment with [facility] and [facility] tomorrow to hopefully be measured for obtaining and AFO splint. I think this would be helpful preventing this from reoccurring. We had contemplated starting the cast this week although to be honest I am reluctant to do that as she's been having nausea, vomiting, and seizure activity over the past three days. She has a history of seizures and have been told is nothing that can be done for these. With that being said I do believe that along with the seizures have the nausea vomiting which upon further questioning doesn't seem to be the normal for her and makes me concerned for the possibility of infection or something else going on. I discussed this with the patient and her mother during the office visit today. I do not feel the wound is effective but maybe something else. The responses this was "this just happens to her at times and we don't know why". They did not seem to be interested in going to the hospital to have this checked out further. 10/09/18 on evaluation today patient presents for follow-up concerning her ongoing toe ulcer. She has been tolerating the dressing changes without complication. Fortunately there does not appear to be any evidence of infection which is great news however I do think that the patient would benefit from going ahead for with the total contact cast.  She's actually in a wheelchair today she tells me that she will use her walker if we initiate the cast. I was very specific about the fact that if we were gonna do the cast I wanted to make sure that she was using the walker in order to prevent any falls. She tells me she does not have stairs that she has to traverse on a regular basis at her home. She has not had any seizures since last week again that something that happens to her often she tells me she did talk to [employer] and they said that it may take up to three weeks to get the brace approved for her. Hopefully that will not take that long but nonetheless in the meantime I do think the cast could be of benefit. 10/12/18 on evaluation today patient appears to be doing rather well in regard to her toe ulcer. It's just been a few days and already this is significantly improved both as far as overall appearance and size. Fortunately there's no sign of infection. She is here for her first obligatory cast change. 10/19/18 Seen today for follow up and management of left great toe ulcer. Wound continues to show improvement. Noted small open area with seroussang drainage with palpation. Denies any increased pain or recent fevers during visit. She will continue calcium alginate with offloading shoe. Denies any questions or concerns during visit. 10/26/18 on evaluation today patient appears to be doing about the same as when I last saw her in regard to her wound bed. Fortunately there does not appear to be any signs of infection. Unfortunately she continues to have a breakdown in regard to the toe region any time that she is not in the cast. It takes almost no time at all for this to happen. Nonetheless she still has not heard anything from the brace being made by [employer] as to when exactly this will be available to her. Fortunately there is no signs of infection at this time. 10/30/18 on evaluation today patient presents for application of the total contact  cast as we just received him this morning. Fortunately we are gonna be able to apply this to her today which is great news. She continues to have no significant pain which is good news. Overall I do feel like things have been improving while she was the cast is when she doesn't have a cast that things get worse. She still has not really heard anything from [facility] regarding her brace. 11/02/18 upon evaluation today patient's wound already appears to be doing significantly better which is good news. Fortunately there does not appear to be any signs of infection also good news. Overall I do think the total contact cast as before is helping to heal this area unfortunately it's just not gonna likely keep the area closed and healed without her getting her brace at least. Again the foot drop is a significant issue for her. 11/09/18 on evaluation today patient appears to be doing excellent in regard to her toe ulcer which in fact is completely healed. Fortunately we finally got the situation squared away with the paperwork which was needed to proceed with getting her brace approved by Medicaid. I have filled that out unfortunately that information has been sent to the orthopedic office that I worked at 2 1/2 years ago and not tired Current wound care measures. Fortunately she seems to be doing very well at this time. 11/23/18 on evaluation today patient appears to be doing More Poorly Compared  to Last Time I Saw Her. At [facility] She Had Completely Healed. Currently she is continuing to have issues with reopening. She states that she just found out that the brace was approved through Medicaid now she just has to go get measured in order to have this fitted for her and then made. Subsequently she does not have an appointment for this yet that is going to complicate things we obviously cannot put her back in the cast if we do not have everything measured because they're not gonna be able to measure her foot  while she is in the cast. 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 Obviously this is unfortunate and does have me somewhat worried as well. 11/30/18 on evaluation today patient's toe ulcer actually appears to be doing fairly well. The good news is she will be getting her brace in the shoes next week on Wednesday. Hopefully we will be able to get this to heal without having to go back in the cast however she may need the cast in order to get the wound completely heal and then go from there. Fortunately there's no signs of infection at this time. 12/07/18 on evaluation today patient fortunately did receive her brace and she states she could tell this definitely makes her walk better. With that being said she's been having issues with her toe where she noticed yesterday there was a lot of tissue that was loosing off this appears to be much larger than what it was previous. She also states that her leg has been read putting much across the top of her foot just about the ankle although this seems to be receiving somewhat. The total area is still red and appears to be someone infected as best I can tell. She is previously taken Bactrim and that may be a good option for her today as well. We are gonna see what I wound culture shows as well and I think that this is definitely appropriate. With that being said outside of the culture I still need to initiate something in the interim and that's what I'm gonna go ahead and select Bactrim is a good option for her. 12/14/18 on evaluation today patient appears to be doing better in regard to her left great toe ulcer as compared to last week's evaluation. There's still some erythema although this is significantly improved which is excellent news. Overall I do believe that she is making good progress is still gonna take some time before she is where I would like her to be  from the standpoint of being able to place her back into the total contact cast. Hopefully we will be where we need to be by next week. 12/21/18 on evaluation today patient actually appears to be doing poorly in regard to her toe ulcer. She's been tolerating the dressing changes without complication. Fortunately there's no signs of systemic infection although she does have a lot of drainage from the toe ulcer and this does Creamer, Jasslyn A. ([id number]) seem to be causing some issues at this point. She does have erythema on the distal portion of her toe that appears to be likely cellulitis. 12/28/18 on evaluation today patient actually appears to be doing a little better in my pinion in regard to her toe ulcer. With that being said she still does have some evidence of infection at this time and for her  culture she had both E. coli as well as enterococcus as organisms noted on evaluation. For that reason I think that though the Keflex likely has treated the E. coli rather well this has really done nothing for the enterococcus. We are going to have to initiate treatment for this specifically. 01/04/19 on evaluation today patient's toe actually appears to be doing better from the standpoint of infection. She currently would like to see about putting the cash back on I think that this is appropriate as long as she takes care of it and keeps it from getting wet. She is gonna have some drainage we can definitely pass this up with Drawtex and alginate to try to prevent as much drainage as possible from causing the problems. With that being said I do want to at least try her with the cast between now and Tuesday. If there any issues we can't continue to use it then I will discontinue the use of the cast at that point. 01/08/19 on evaluation today patient actually appears to be doing very well as far as her foot ulcer specifically the great toe on the left is concerned. She did have an area of rubbing on the medial  aspect of her left ankle which again is from the cast. Fortunately there's no signs of infection at this point in this appears to be a very slight skin breakdown. The patient tells me she felt it rubbing but didn't think it was that bad. Fortunately there is no signs of active infection at this time which is good news. No fevers, chills, nausea, or vomiting noted at this time. 01/15/19 on evaluation today patient actually appears to be doing well in regard to her toe ulcer. Again as previous she seems to do well and she has the cast on which indicates to me that during the time she doesn't have a cast on she's putting way too much pressure on this region. Obviously I think that's gonna be an issue as with the current national emergency concerning the Covid-19 Virus it has been recommended that we discontinue the use of total contact casting by the chief medical officer of our company, Dr. Simona Huh. The reasoning is that if a patient becomes sick and cannot come into have the cast removed they could not just leave this on for an additional two weeks. Obviously the hospitals also do not want to receive patient's who are sick into the emergency department to potentially contaminate the region and spread the Covid-19 Virus among other sick individuals within the hospital system. Therefore at this point we are suspending the use of total contact cast until the current emergency subsides. This was all discussed with the patient today as well. 01/22/19 on evaluation today patient's wound on her left great toe appears to be doing slightly worse than previously noted last week. She tells me that she has been on this quite a bit in fact she tells me she's been awake for 38 straight hours. This is due to the fact that she's having to care for grandparents because nobody else will. She has been taking care of them for five the last seven days since I've seen her they both have dementia his is from a stroke and her  grandmother's was progressive. Nonetheless she states even her mom who knows her condition and situation has only help two of those days to take care of them she's been taking care of the rest. Fortunately there does not appear to be any signs  of active infection in regard to her toe at this point although obviously it doesn't look as good as it did previous. I think this is directly related to her not taking off the pressure and friction by way of taking things easy. Though I completely understand what's going on. 01/29/19 on evaluation today patient's tools are actually appears to be showing some signs of improvement today compared to last week's evaluation as far as not necessarily the overall size of the wound but the fact that she has some new skin growth in between the two ends of the wound opening. Overall I feel like she has done well she states that she had a family member give her what sounds to be a CAM walker boot which has been helpful as well. 02/05/19 on evaluation today patient's wound bed actually appears to be doing significantly better in regard to her overall appearance of the size of the wound. With that being said she is still having an issue with offloading efficiently enough to get this to close. Apparently there is some signs of infection at this point as well unfortunately. Previously she's done well of Augmentin I really do not see anything that needs to be culture currently but there theme and cellulitis of the foot that I'm seeing I'm gonna go ahead and place her on an antibiotic today to try to help clear this up. 02/12/2019 on evaluation today patient actually appears to be doing poorly in regard to her overall wound status. She tells me she has been using her offloading shoe but actually comes in today wearing her tennis shoe with the AFO brace. Again as I previously discussed with her this is really not sufficient to allow the area to heal appropriately. Nonetheless she  continues to be somewhat noncompliant and I do wonder based on what she has told my nurse in the past as to whether or not a good portion of this noncompliance may be recreational drug and alcohol related. She has had a history of heroin overdose and this was fairly recently in the past couple of months that have been seeing her. Nonetheless overall I feel like her wound looks significantly worse today compared to what it was previous. She still has significant erythema despite the Augmentin I am not sure that this is an appropriate medication for her infection I am also concerned that the infection may have gone down into her bone. 02/19/19 on evaluation today patient actually appears to be doing about the same in regard to her toe ulcer. Unfortunately she continues to show signs of bone exposure and infection at this point. There does not appear to be any evidence of worsening of the infection but I'm also not really sure that it's getting significantly better. She is on the Augmentin which should be sufficient for the Staphylococcus aureus infection that she has at this point. With that being said she may need IV antibiotics to more appropriately treat this. We did have a discussion today about hyperbaric option therapy. 02/28/19 on evaluation today patient actually appears to be doing much worse in regard to the wound on her left great toe as compared to even my previous evaluation last week. Unfortunately this seems to be training in a pretty poor direction. Her toe was actually now starting to angle laterally and I can actually see the entire joint area of the proximal portion of the digit where is the distal portion of the digit again is no longer even in contact with  the joint line. Unfortunately there's a lot more necrotic tissue around the edge and the toe appears to be showing signs of becoming gangrenous in my pinion. I'm very concerned about were things stand at this point. She did see  infectious disease and they are planning to send in a prescription for Sivextro for her and apparently this has been approved. With that being said I don't think she should avoid taking this but at the same time I'm not sure that it's gonna be sufficient to save her toe at this point. 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 03/05/19 on evaluation today patient actually appears to be doing quite well in regard to her toe all things considering. She still has bone exposed but there appears to be much less your thing on overall the appearance of the wound and the toe itself is dramatically improved. She still does have some issues currently obviously with infection she did see vascular as well and there concerned that her blood flow to the toad. For that reason they are setting up for an angiogram next week. 03/14/19 on evaluation today patient appears to be doing very poor in regard to her toe and specifically in regard to the ulceration and the fact that she's starting to notice the toe was leaning even more towards the lateral aspect and the complete joint is visible on the proximal aspect of the joint. Nonetheless she's also noted a significant odor and the tip of the toe is turning more dark and necrotic appearing. Overall I think she is getting worse not better as far as this is concerned. For that reason I am recommending at this point that she likely needs to be seen for likely amputation. LASHAYA, NOVINGER ([id number]) READMISSION 03/19/2021 This is a patient that we cared for in this clinic for a prolonged period of time in 2019 and 2020 with a left foot and left first toe wound. 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 Luana Shu. In December 2021 she had an ulcer on her right great toe as well as the fourth and fifth toes. 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 She also had an angiogram at that time and underwent angioplasty of the right anterior tibial artery. In any case she claims that the wound on the right foot is closed I did not look at this today which was probably an oversight although I think that should be done next week. After her surgery she developed a dehiscence but I do not see any follow-up of this. According to Dr. Deborra Medina last review that she was out of the area being cared for by another physician but recently came back to his attention. 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 A culture of this area showed E. coli apparently before she came back to see Dr. Luana Shu she was supposed to be receiving antibiotics but she did not really take them. Nor is she offloading this area at all. Finally her last hemoglobin A1c listed in epic was in March 2022 at 14.1 she says things are a lot better since then although  I am not sure. 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 She had this wound at the time. During this admission she had E. coli sepsis a TEE was negative. 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 She is still a heavy cigarette smoker. She has a history of bipolar disorder seizure disorder ABI in our clinic was 1.05 6/1; left midfoot in the setting of a TMA done previously. Round circular wound with a "knuckle" of protruding tissue. The problem is that  the knuckle was not attached to any of the surrounding granulation and this probed proximally widely I removed a large portion of this tissue. This wound goes with considerable undermining laterally. I do not feel any bone there was no purulence but this is a deep wound. 6/8; in spite of the debridement I did last week. She arrives with a wound looking exactly the same. A protruding "knuckle" of tissue nonadherent to most of the surrounding tissue. There is considerable depth around this from 6-12 o'clock at 2.7 cm and undermining of 1 cm. This does not look overtly infected and the x-ray I did last week was negative for any osseous abnormalities. We have been using silver collagen 6/15; deep tissue culture I did last week showed moderate staph aureus and moderate Pseudomonas. This will definitely require prolonged antibiotic therapy. The pathology on the protuberant area was negative for malignancy fungus etc. 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 We have been using silver collagen. I am going to be prescribing Levaquin for 2 weeks. Her CT scan of the foot is down for 7/5 6/22; CT scan of the foot on 7 5. She says she has hardware in the left leg from her previous fracture. She is on the Levaquin for the deep tissue culture I did that showed methicillin sensitive staph aureus and Pseudomonas. I gave her a 2-week supply and she will have another week. She arrives in clinic today with the same protuberant tissue however this is nonadherent to the tissue surrounding it. I am really at a loss to explain this unless there is underlying deep tissue infection 6/29; patient presents for 1 week follow-up. She has been using collagen to the wound bed. She reports taking her antibiotics as prescribed.She has no complaints or issues today. She denies signs of infection. 7/6; patient presents for one week followup. She has been using collagen to the  wound bed. She states she is taking Levaquin however at times she is not able to keep it down. She denies signs of infection. 7/13; patient presents for 1 week follow-up. She has been using silver alginate to the wound bed. She still has nausea when taking her antibiotics. She denies signs of infection. 7/20; patient presents for 1 week follow-up. She has been using silver alginate with gentamicin cream to the wound bed. She denies any issues and has no complaints today. She denies signs of infection. 7/27; patient presents for 1 week follow-up. She continues to use silver alginate with gentamicin cream to the wound bed. She reports starting her antibiotics. She has no issues or complaints. Overall she reports stability to the wound. 8/3; patient presents for 1 week follow-up. She has been using  silver alginate with gentamicin cream to the wound bed. She reports completing all antibiotics. She has no issues or complaints today. She denies signs of infection. 8/17; patient presents for 2-week follow-up. He is to use silver alginate to the wound bed. She has no issues or complaints today. She denies signs of infection. She reports her pain has improved in her foot since last clinic visit 8/24; patient presents for 1 week follow-up. She continues to use silver alginate to the wound bed. She has no issues or complaints. She denies signs of infection. Pain is stable. 9/7; patient presents for follow-up. She missed her last week appointment due to feeling ill. She continues to use silver alginate. She has a new wound to the right lower extremity that is covered in eschar. She states It occurred over the past week and has no idea how it started. She currently denies signs of infection. 9/14; patient presents for follow-up. To the left foot wound she has been using gentamicin cream and silver alginate. To the right lower extremity wound she has been keeping this covered and has not obtain Santyl. 9/21;  patient presents for follow-up. She reports using gentamicin cream and silver alginate to the left foot and Santyl to the right lower extremity wound. She has no issues or complaints today. She denies signs of infection. 9/28; patient presents for follow-up. She reports a new wound to her right heel. She states this occurred a few days ago and is progressively gotten worse. She has been trying to clean the area with a Q-tip and Santyl. She reports stability in the other 2 wounds. She has been using gentamicin cream and silver alginate to the left foot and Santyl to the right lower extremity wound. 10/12; patient presents for follow-up. She reports improvement to the wound beds. She is seeing vein and vascular to discuss the potential of a left BKA. She states they are going to do an arteriogram. She continues to use silver alginate with dressing changes to her wounds. Patient History Information obtained from Patient. Social History Current every day smoker, Marital Status - Single, Alcohol Use - Never, Drug Use - Prior History, Caffeine Use - Daily. Medical History Ear/Nose/Mouth/Throat Patient has history of Chronic sinus problems/congestion, Middle ear problems Hematologic/Lymphatic MAKYNLEY, RAYNOLDS A. ([id number]) Patient has history of Anemia Respiratory Patient has history of Chronic Obstructive Pulmonary Disease (COPD) Cardiovascular Patient has history of Congestive Heart Failure - EF 33% Endocrine Patient has history of Type II Diabetes Genitourinary Patient has history of End Stage Renal Disease Integumentary (Skin) Patient has history of History of pressure wounds Neurologic Patient has history of Neuropathy - feel and legs Objective Constitutional Vitals Time Taken: 11:31 AM, Height: 69 in, Weight: 185 lbs, BMI: 27.3, Temperature: 98.2 F, Pulse: 123 bpm, Respiratory Rate: 18 breaths/min, Blood Pressure: 119/81 mmHg. General Notes: Left foot (Transmetatarsal amputation):  Wound to the plantar aspect that has granulation tissue and nonviable tissue present. Right lower extremity: To the anterior aspect there is an open wound with Granulation tissue and scant nonviable tissue present. Right foot: To the heel there is an open wound with Granulation tissue present. No undermining noted. Integumentary (Hair, Skin) Wound #7 status is Open. Original cause of wound was Gradually Appeared. The date acquired was: 08/07/2020. The wound has been in treatment 19 weeks. The wound is located on the [place]. The wound measures 1.6cm length x 3.5cm width x 0.8cm depth; 4.398cm^2 area and 3.519cm^3 volume. There is Fat Layer (Subcutaneous Tissue) exposed.  There is no tunneling or undermining noted. There is a medium amount of purulent drainage noted. The wound margin is thickened. There is large (67-100%) red, pink, hyper - granulation within the wound bed. There is a small (1-33%) amount of necrotic tissue within the wound bed including Adherent Slough. Wound #8 status is Open. Original cause of wound was Gradually Appeared. The date acquired was: 06/30/2021. The wound has been in treatment 5 weeks. The wound is located on the Right,Anterior Lower Leg. The wound measures 4cm length x 1.8cm width x 0.2cm depth; 5.655cm^2 area and 1.131cm^3 volume. There is Fat Layer (Subcutaneous Tissue) exposed. There is no tunneling or undermining noted. There is a medium amount of serosanguineous drainage noted. There is medium (34-66%) red, pink granulation within the wound bed. There is a medium (34-66%) amount of necrotic tissue within the wound bed including Adherent Slough. Wound #9 status is Open. Original cause of wound was Gradually Appeared. The date acquired was: 07/18/2021. The wound has been in treatment 2 weeks. The wound is located on the Right Calcaneus. The wound measures 0.5cm length x 1.2cm width x 0.2cm depth; 0.471cm^2 area and 0.094cm^3 volume. There is Fat Layer  (Subcutaneous Tissue) exposed. There is no tunneling or undermining noted. There is a medium amount of serous drainage noted. The wound margin is flat and intact. There is large (67-100%) pink granulation within the wound bed. There is no necrotic tissue within the wound bed. Assessment Active Problems ICD-10 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ity Unspecified open wound, right lower leg, subsequent encounter Type 2 diabetes mellitus with foot ulcer Type 2 diabetes mellitus with diabetic polyneuropathy Unspecified open wound, right foot, initial encounter Patient has 3 wounds. Her left plantar foot wound is stable. No obvious signs of infection. I recommended continuing gentamicin and Santyl to this. Her right plantar foot wound looks significantly better. I recommended continuing silver alginate to this and aggressive offloading. Her anterior leg wound also appears better with more granulation tissue present. I recommended continuing Santyl to this. No signs of infection on Dahmer, Gwenevere A. ([id number]) exam. 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 Unfortunately this is a chronic nonhealing wound that will likely not heal since patient cannot stay off of it. CT of the foot showed no clear evidence of osteomyelitis. Will continue to provide wound care. Follow-up in 1 week Plan 1. Santyl, silver alginate, gentamicin 2. Aggressive offloading 3. Follow-up in 1 week Electronic Signature(s) Signed: 08/04/2021 12:23:03 PM By: Kalman Shan DO Entered By: Kalman Shan on 08/04/2021 12:22:21 [name] ([id number]) -------------------------------------------------------------------------------- ROS/PFSH Details Patient Name: [name] Date of Service: 08/04/2021 10:45 AM Medical Record Number: [id number] Patient Account Number: [id number] Date of Birth/Sex: 1976-09-10 (45 y.o. F) Treating RN: Dolan Amen Primary Care Provider: Raelene Bott Other Clinician: Referring Provider: Raelene Bott Treating Provider/Extender: Yaakov Guthrie in Treatment: 63 Information Obtained From Patient Ear/Nose/Mouth/Throat Medical History: Positive for: Chronic sinus problems/congestion; Middle ear problems Hematologic/Lymphatic Medical History: Positive for: Anemia Respiratory Medical History: Positive for: Chronic Obstructive Pulmonary Disease (COPD) Cardiovascular Medical History: Positive for: Congestive Heart Failure - EF 33% Endocrine Medical History: Positive for: Type II Diabetes Time with diabetes: 14 years Treated with: Insulin, Oral agents Blood sugar tested every day: No Blood sugar testing results: Bedtime: 176 Genitourinary Medical History: Positive for: End Stage Renal Disease Integumentary (Skin) Medical History: Positive for: History of pressure wounds Neurologic Medical History: Positive for: Neuropathy -  feel and legs HBO Extended History Items Ear/Nose/Mouth/Throat: Ear/Nose/Mouth/Throat: Chronic sinus problems/congestion Middle ear problems Immunizations Pneumococcal Vaccine: Received Pneumococcal Vaccination: No Larmon, Stevey A. ([id number]) Implantable Devices No devices added Family and Social History Current every day smoker; Marital Status - Single; Alcohol Use: Never; Drug Use: Prior History; Caffeine Use: Daily Electronic Signature(s) Signed: 08/04/2021 12:23:03 PM By: Kalman Shan DO Signed: 08/04/2021 4:18:42 PM By: Dolan Amen RN Entered By: Kalman Shan on 08/04/2021 12:15:54 [name] ([id number]) -------------------------------------------------------------------------------- [place] Details Patient Name: [name] Date of Service: 08/04/2021 Medical Record Number: [id number] Patient Account Number: [id number] Date of Birth/Sex: 1975/11/11 (45 y.o. F) Treating RN: Dolan Amen Primary Care Provider:  Raelene Bott Other Clinician: Referring Provider: Raelene Bott Treating Provider/Extender: Yaakov Guthrie in Treatment: 19 Diagnosis Coding ICD-10 Codes Code Description [phone] 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ity S81.801D Unspecified open wound, right lower leg, subsequent encounter E11.621 Type 2 diabetes mellitus with foot ulcer E11.42 Type 2 diabetes mellitus with diabetic polyneuropathy S91.301A Unspecified open wound, right foot, initial encounter Facility Procedures CPT4 Code: [id number] Description: 99213 - WOUND CARE VISIT-LEV 3 EST PT Modifier: Quantity: 1 Physician Procedures CPT4 Code: [id number] Description: [id number] - WC PHYS LEVEL 3 - EST PT Modifier: Quantity: 1 CPT4 Code: Description: ICD-10 Diagnosis Description L97.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 of other part of left foot with other specifie S81.801D Unspecified open wound, right lower leg, subsequent encounter E11.621 Type 2 diabetes mellitus with foot ulcer S91.301A  Unspecified open wound, right foot, initial encounter Modifier: d severity Quantity: Electronic Signature(s) Signed: 08/04/2021 2:20:54 PM By: Dolan Amen RN Signed: 08/04/2021 2:51:01 PM By: Kalman Shan DO Previous Signature: 08/04/2021 12:23:03 PM Version By: Kalman Shan DO Entered By: Dolan Amen on 08/04/2021 14:20:53

## 2021-08-10 ENCOUNTER — Encounter (INDEPENDENT_AMBULATORY_CARE_PROVIDER_SITE_OTHER): Payer: Self-pay | Admitting: Vascular Surgery

## 2021-08-11 ENCOUNTER — Encounter: Payer: Medicaid Other | Admitting: Internal Medicine

## 2021-08-13 ENCOUNTER — Encounter (INDEPENDENT_AMBULATORY_CARE_PROVIDER_SITE_OTHER): Payer: Medicaid Other

## 2021-08-13 ENCOUNTER — Ambulatory Visit (INDEPENDENT_AMBULATORY_CARE_PROVIDER_SITE_OTHER): Payer: Medicaid Other | Admitting: Nurse Practitioner

## 2021-08-16 ENCOUNTER — Encounter (INDEPENDENT_AMBULATORY_CARE_PROVIDER_SITE_OTHER): Payer: Medicaid Other

## 2021-08-16 ENCOUNTER — Ambulatory Visit (INDEPENDENT_AMBULATORY_CARE_PROVIDER_SITE_OTHER): Payer: Medicaid Other | Admitting: Vascular Surgery

## 2021-08-18 ENCOUNTER — Encounter: Payer: Medicaid Other | Admitting: Internal Medicine

## 2021-08-25 ENCOUNTER — Encounter: Payer: Medicaid Other | Attending: Internal Medicine | Admitting: Internal Medicine

## 2021-08-25 ENCOUNTER — Other Ambulatory Visit: Payer: Self-pay

## 2021-08-25 DIAGNOSIS — M21372 Foot drop, left foot: Secondary | ICD-10-CM | POA: Diagnosis not present

## 2021-08-25 DIAGNOSIS — Z89421 Acquired absence of other right toe(s): Secondary | ICD-10-CM | POA: Diagnosis not present

## 2021-08-25 DIAGNOSIS — N183 Chronic kidney disease, stage 3 unspecified: Secondary | ICD-10-CM | POA: Insufficient documentation

## 2021-08-25 DIAGNOSIS — L97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ity: Secondary | ICD-10-CM | POA: Diagnosis not present

## 2021-08-25 DIAGNOSIS — E11621 Type 2 diabetes mellitus with foot ulcer: Secondary | ICD-10-CM | POA: Insufficient documentation

## 2021-08-25 DIAGNOSIS — I429 Cardiomyopathy, unspecified: Secondary | ICD-10-CM | POA: Diagnosis not present

## 2021-08-25 DIAGNOSIS — I509 Heart failure, unspecified: Secondary | ICD-10-CM | POA: Insufficient documentation

## 2021-08-25 DIAGNOSIS — F319 Bipolar disorder, unspecified: Secondary | ICD-10-CM | POA: Insufficient documentation

## 2021-08-25 DIAGNOSIS — L97812 Non-pressure chronic ulcer of other part of right lower leg with fat layer exposed: Secondary | ICD-10-CM | POA: Insufficient documentation

## 2021-08-25 DIAGNOSIS — I130 Hypertensive heart and chronic kidney disease with heart failure and stage 1 through stage 4 chronic kidney disease, or unspecified chronic kidney disease: Secondary | ICD-10-CM | POA: Diagnosis not present

## 2021-08-25 DIAGNOSIS — F17210 Nicotine dependence, cigarettes, uncomplicated: Secondary | ICD-10-CM | POA: Diagnosis not present

## 2021-08-25 DIAGNOSIS — X58XXXA Exposure to other specified factors, initial encounter: Secondary | ICD-10-CM | POA: Insufficient documentation

## 2021-08-25 DIAGNOSIS — S81801A Unspecified open wound, right lower leg, initial encounter: Secondary | ICD-10-CM | POA: Diagnosis not present

## 2021-08-25 DIAGNOSIS — G8929 Other chronic pain: Secondary | ICD-10-CM | POA: Diagnosis not present

## 2021-08-25 DIAGNOSIS — E1122 Type 2 diabetes mellitus with diabetic chronic kidney disease: Secondary | ICD-10-CM | POA: Diagnosis not present

## 2021-08-25 DIAGNOSIS — G40909 Epilepsy, unspecified, not intractable, without status epilepticus: Secondary | ICD-10-CM | POA: Diagnosis not present

## 2021-08-25 DIAGNOSIS — S81801D Unspecified open wound, right lower leg, subsequent encounter: Secondary | ICD-10-CM | POA: Diagnosis not present

## 2021-08-25 DIAGNOSIS — S91301A Unspecified open wound, right foot, initial encounter: Secondary | ICD-10-CM | POA: Insufficient documentation

## 2021-08-25 DIAGNOSIS — E1142 Type 2 diabetes mellitus with diabetic polyneuropathy: Secondary | ICD-10-CM | POA: Diagnosis not present

## 2021-08-25 DIAGNOSIS — E785 Hyperlipidemia, unspecified: Secondary | ICD-10-CM | POA: Insufficient documentation

## 2021-08-25 DIAGNOSIS — L97512 Non-pressure chronic ulcer of other part of right foot with fat layer exposed: Secondary | ICD-10-CM | POA: Diagnosis not present

## 2021-08-27 NOTE — Progress Notes (Signed)
MADYLIN, [facility] ([id number]) Visit Report for 08/25/2021 Chief Complaint Document Details Patient Name: [name] Date of Service: 08/25/2021 11:00 AM Medical Record Number: [id number] Patient Account Number: [id number] Date of Birth/Sex: 07-23-1976 (45 y.o. F) Treating RN: Carlene Coria Primary Care Provider: Raelene Bott Other Clinician: Referring Provider: Raelene Bott Treating Provider/Extender: Yaakov Guthrie in Treatment: 22 Information Obtained from: Patient Chief Complaint 03/19/2021; patient referred by Dr. Luana Shu who has been looking after her left foot for quite [name] period of time for review of [name] nonhealing area in the left midfoot Electronic Signature(s) Signed: 08/25/2021 1:46:27 PM By: Heather Shan DO Entered By: [name] on 08/25/2021 12:38:41 [name] ([id number]) -------------------------------------------------------------------------------- Debridement Details Patient Name: [name] Date of Service: 08/25/2021 11:00 AM Medical Record Number: [id number] Patient Account Number: [id number] Date of Birth/Sex: 04-26-1976 (45 y.o. F) Treating RN: Carlene Coria Primary Care Provider: Raelene Bott Other Clinician: Referring Provider: Raelene Bott Treating Provider/Extender: Yaakov Guthrie in Treatment: 22 Debridement Performed for Wound #8 Right,Anterior Lower Leg Assessment: Performed By: Physician Heather Shan, MD Debridement Type: Debridement Severity of Tissue Pre Debridement: Fat layer exposed Level of Consciousness (Pre- Awake and Alert procedure): Pre-procedure Verification/Time Out Yes - 11:40 Taken: Start Time: 11:40 Pain Control: Lidocaine 4% Topical Solution Total Area Debrided (L x W): 4 (cm) x 1.7 (cm) = 6.8 (cm) Tissue and other material Viable, Non-Viable, Slough, Subcutaneous, Skin: Dermis , Skin: Epidermis, Slough debrided: Level: Skin/Subcutaneous Tissue Debridement Description:  Excisional Instrument: Curette Bleeding: Minimum Hemostasis Achieved: Pressure End Time: 11:48 Procedural Pain: 0 Post Procedural Pain: 0 Response to Treatment: Procedure was tolerated well Level of Consciousness (Post- Awake and Alert procedure): Post Debridement Measurements of Total Wound Length: (cm) 4 Width: (cm) 1.7 Depth: (cm) 0.2 Volume: (cm) 1.068 Character of Wound/Ulcer Post Debridement: Improved Severity of Tissue Post Debridement: Fat layer exposed Post Procedure Diagnosis Same as Pre-procedure Electronic Signature(s) Signed: 08/25/2021 1:46:27 PM By: Heather Shan DO Signed: 08/26/2021 5:07:02 PM By: Carlene Coria RN Entered By: Carlene Coria on 08/25/2021 11:49:45 [place], [place] ([id number]) -------------------------------------------------------------------------------- HPI Details Patient Name: [name] Date of Service: 08/25/2021 11:00 AM Medical Record Number: [id number] Patient Account Number: [id number] Date of Birth/Sex: 02/15/1976 (45 y.o. F) Treating RN: Carlene Coria Primary Care Provider: Raelene Bott Other Clinician: Referring Provider: Raelene Bott Treating Provider/Extender: Yaakov Guthrie in Treatment: 22 History of Present Illness HPI Description: 01/18/18-She is here for initial evaluation of the left great toe ulcer. She is [name] poor historian in regards to timeframe in detail. She states approximately 4 weeks ago she lacerated her toe on something in the house. She followed up with her primary care who placed her on Bactrim and ultimately [name] second dose of Bactrim prior to coming to wound clinic. She states she has been treating the toe with peroxide, Betadine and [name] Band-Aid. She did not check her blood sugar this morning but checked it yesterday morning it was 327; she is unaware of [name] recent A1c and there are no current records. She saw Dr. she would've orthopedics last week for an old injury to the left ankle, she states he  did not see her toe, nor did she bring it to his attention. She smokes approximately 1 pack cigarettes [name] day. Her social situation is concerning, she arrives this morning with her mother who appears extremely intoxicated/under the influence; her mother was asked to leave the room and be monitored by the patient's grandmother. The patient's aunt then accompanied the  patient and the room throughout the rest of the appointment. We had [name] lengthy discussion regarding the deleterious effects of uncontrolled hyperglycemia and smoking as it relates to wound healing and overall health. She was strongly encouraged to decrease her smoking and get her diabetes under better control. She states she is currently on [name] diet and has cut down her [facility] consumption. The left toe is erythematous, macerated and slightly edematous with malodor present. The edema in her left foot is below her baseline, there is no erythema streaking. We will treat her with Santyl, doxycycline; we have ordered and xray, culture and provided [name] Peg assist surgical shoe and cultured the wound. 01/25/18-She is here in follow-up evaluation for [name] left great toe ulcer and presents with an abscess to her suprapubic area. She states her blood sugars remain elevated, feeling "sick" and if levels are below 250, but she is trying. She has made no attempt to decrease her smoking stating that we "can't take away her food in her cigarettes". She has been compliant with offloading using the PEG assist you. She is using Santyl daily. the culture obtained last week grew staph aureus and Enterococcus faecalis; continues on the doxycycline and Augmentin was added on Monday. The suprapubic area has erythema, no femoral variation, purple discoloration, minimal induration, was accessed with [name] cotton tip applicator with sanguinopurulent drainage, this was cultured, I suspect the current antibiotic treatment will cover and we will not add anything to her  current treatment plan. She was advised to go to urgent care or ER with any change in redness, induration or fever. 02/01/18-She is here in follow-up evaluation for left great toe ulcers and [name] new abdominal abscess from last week. She was able to use packing until earlier this week, where she "forgot it was there". She states she was feeling ill with GI symptoms last week and was not taking her antibiotic. She states her glucose levels have been predominantly less than 200, with occasional levels between 200-250. She thinks this was contributing to her GI symptoms as they have resolved without intervention. There continues to be significant laceration to left toe, otherwise it clinically looks stable/improved. There is now less superficial opening to the lateral aspect of the great toe that was residual blister. We will transition to [facility] to all wounds, she will continue her Augmentin. If there is no change or deterioration next week for reculture. 02/08/18-She is here in follow-up evaluation for left great toe ulcer and abdominal ulcer. There is an improvement in both wounds. She has been wrapping her left toe with coban, not by our direction, which has created an area of discoloration to the medial aspect; she has been advised to NOT use coban secondary to her neuropathy. She states her glucose levels have been high over this last week ranging from 200-350, she continues to smoke. She admits to being less compliant with her offloading shoe. We will continue with same treatment plan and she will follow-up next week. 02/15/18-She is here in follow-up evaluation for left great toe ulcer and abdominal ulcer. The abdominal ulcer is epithelialized. The left great toe ulcer is improved and all injury from last week using the Coban wrap is resolved, the lateral ulcer is healed. She admits to noncompliance with wearing offloading shoe and admits to glucose levels being greater than 300 most of the  week. She continues to smoke and expresses no desire to quit. There is one area medially that probes deeper than it has  historically, erythema to the toe and dorsal foot has consistently waxed and waned. There is no overt signs of cellulitis or infection but we will culture the wound for any occult infection given the new area of depth and erythema. We will hold off on sensitivities for initiation of antibiotic therapy. 02/22/18-She is here in follow up evaluation for left great toe ulcer. There is overall significant improvement in both wound appearance, erythema and edema with changes made last week. She was not initiated on antibiotic therapy. Culture obtained last week showed oxacillin sensitive staph aureus, sensitive to clindamycin. Clindamycin has been called into the pharmacy but she has been instructed to hold off on initiation secondary to overall clinical improvement and her history of antibiotic intolerance. She has been instructed to contact the clinic with any noted changes/deterioration and the wound, erythema, edema and/or pain. She will follow-up next week. She continues to smoke and her glucose levels remain elevated >250; she admits to compliance with offloading shoe 03/01/18 on evaluation today patient appears to be doing fairly well in regard to her left first toe ulcer. She has been tolerating the dressing changes with the [facility] Dressing without complication and overall this has definitely showed signs of improvement according to records as well is what the patient tells me today. I'm very pleased in that regard. She is having no pain today 03/08/18 She is here for follow up evaluation of [name] left great toe ulcer. She remains non-compliant with glucose control and smoking cessation; glucose levels consistently >200. She states that she got new shoe inserts/peg assist. She admits to compliance with offloading. Since my last evaluation there is significant improvement. We will  switch to prisma at this time and she will follow up next week. She is noted to be tachycardic at this appointment, heart rate 120s; she has [name] history of heart rate 70-130 according to our records. She admits to extreme agitation r/t personal issues; she was advised to monitor her heartrate and contact her physician if it does not return to [name] more normal range (<100). She takes cardizem twice daily. 03/15/18-She is here in follow-up evaluation for left great toe ulcer. She remains noncompliant with glucose control and smoking cessation. She admits to compliance with wearing offloading shoe. The ulcer is improved/stable and we will continue with the same treatment plan and she will follow-up next week 03/22/18-She is here for evaluation for left great toe ulcer. There continues to be significant improvement despite recurrent hyperglycemia (over 500 yesterday) and she continues to smoke. She has been compliant with offloading and we will continue with same treatment plan and she will follow-up next week. 03/29/18-She is here for evaluation for left great toe ulcer. Despite continuing to smoke and uncontrolled diabetes she continues to improve. She is compliant with offloading shoe. We will continue with the same treatment plan and she will follow-up next week 04/05/18- She is here in follow up evaluation for [name] left great toe ulcer; she presents with small pustule to left fifth toe (resembles ant bite). She admits to compliance with wearing offloading shoe; continues to smoke or have uncontrolled blood glucose control. There is more callus than usual with evidence of bleeding; she denies known trauma. 04/12/18-She is here for evaluation of left great toe ulcer. Despite noncompliance with glycemic control and smoking she continues to make Ludemann, Eilleen [name]. ([id number]) improvement. She continues to wear offloading shoe. The pustule, that was identified last week, to the left fifth toe is resolved. She will  follow-up in 2 weeks 05/03/18-she is seen in follow-up evaluation for [name] left great toe ulcer. She is compliant with offloading, otherwise noncompliant with glycemic control and smoking. She has plateaued and there is minimal improvement noted. We will transition to [facility], replaced the insert to her surgical shoe and she will follow-up in one week 05/10/18- She is here in follow up evaluation for [name] left great toe ulcer. It appears stable despite measurement change. We will continue with same treatment plan and follow up next week. 05/24/18-She is seen in follow-up evaluation for [name] left great toe ulcer. She remains compliant with offloading, has made significant improvement in her diet, decreasing the amount of sugar/soda. She said her recent A1c was 10.9 which is lower than. She did see [name] diabetic nutritionist/educator yesterday. She continues to smoke. We will continue with the same treatment plan and she'll follow-up next week. 05/31/18- She is seen in follow-up evaluation for left great toe ulcer. She continues to remain compliant with offloading, continues to make improvement in her diet, increasing her water and decreasing the amount of sugar/soda. She does continue to smoke with no desire to quit. We will apply Prisma to the depth and Hydrofera Blue over. We have not received insurance authorization for oasis. She will follow up next week. 06/07/18-She is seen in follow-up evaluation for left great toe ulcer. It has stalled according to today's measurements although base appears stable. She says she saw [name] diabetic educator yesterday; her average blood sugars are less than 300 which is an improvement for her. She continues to smoke and states "that's my next step" She continues with water over soda. We will order for xray, culture and reinstate ace wrap compression prior to placing apligraf for next week. She is voicing no complaints or concerns. Her dressing will change to iodoflex over the  next week in preparation for apligraf. 06/14/18-She is seen in follow-up evaluation for left great toe ulcer. Plain film x-ray performed last week was negative for osteomyelitis. Wound culture obtained last week grew strep B and OSSA; she is initiated on keflex and cefdinir today; there is erythema to the toe which could be from ace wrap compression, she has [name] history of wrapping too tight and has has been encouraged to maintain ace wraps that we place today. We will hold off on application of apligraf today, will apply next week after antibiotic therapy has been initiated. She admits today that she has resumed taking [name] shower with her foot/toe submerged in water, she has been reminded to keep foot/toe out of the bath water. She will be seen in follow up next week 06/21/18-she is seen in follow-up evaluation for left great toe ulcer. She is tolerating antibiotic therapy with no GI disturbance. The wound is stable. Apligraf was applied today. She has been decreasing her smoking, only had 4 cigarettes yesterday and 1 today. She continues being more compliant in diabetic diet. She will follow-up next week for evaluation of site, if stable will remove at 2 weeks. 06/28/18- She is here in follow up evalution. Apligraf was placed last week, she states the dressing fell off on Tuesday and she was dressing with hydrofera blue. She is healed and will be discharged from the clinic today. She has been instructed to continue with smoking cessation, continue monitoring glucose levels, offloading for an additional 4 weeks and continue with hydrofera blue for additional two weeks for any possible microscopic opening. Readmission: 08/07/18 on evaluation today patient presents for reevaluation concerning  the ulcer of her right great toe. She was previously discharged on 06/28/18 healed. Nonetheless she states that this began to show signs of drainage she subsequently went to her primary care provider. Subsequently an  x-ray was performed on 08/01/18 which was negative. The patient was also placed on antibiotics at that time. Fortunately they should have been effective for the infection. Nonetheless she's been experiencing some improvement but still has [name] lot of drainage coming from the wound itself. 08/14/18 on evaluation today patient's wound actually does show signs of improvement in regard to the erythema at this point. She has completed the antibiotics. With that being said we did discuss the possibility of placing her in [name] total contact cast as of today although I think that I may want to give this just [name] little bit more time to ensure nothing recurrence as far as her infection is concerned. I do not want to put in the cast and risk infection at that time if things are not completely resolved. With that being said she is gonna require some debridement today. 08/21/18 on evaluation today patient actually appears to be doing okay in regard to her toe ulcer. She's been tolerating the dressing changes without complication. With that being said it does appear that she is ready and in fact I think it's appropriate for [place] to go ahead and initiate the total contact cast today. Nonetheless she will require some sharp debridement to prepare the wound for application. Overall I feel like things have been progressing well but we do need to do something to get this to close more readily. 08/24/18 patient seen today for reevaluation after having had the total contact cast applied on Tuesday. She seems to have done very well the wound appears to be doing great and overall I'm pleased with the progress that she's made. There were no abnormal areas of rubbing from the cast on her lower extremity. 08/30/18 on evaluation today patient actually appears to be completely healed in regard to her plantar toe ulcer. She tells me at this point she's been having [name] lot of issues with the cast. She almost fell [name] couple of times the state  shall the step of her dog [name] couple times as well. This is been [name] very frustrating process for her other nonetheless she has completely healed the wound which is excellent news. Overall there does not appear to be the evidence of infection at this time which is great news. 09/11/18 evaluation today patient presents for follow-up concerning her great toe ulcer on the left which has unfortunately reopened since I last saw her which was only [name] couple of weeks ago. Unfortunately she was not able to get in to get the shoe and potentially the AFO that's gonna be necessary due to her left foot drop. She continues with offloading shoe but this is not enough to prevent her from reopening it appears. When we last had her in the total contact cast she did well from [name] healing standpoint but unfortunately the wound reopened as soon as she came out of the cast within just [name] couple of weeks. 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 This is also part of the reason why she falls on [name] regular basis. I just do not  believe that is gonna be safe for [place] to reinitiate the total contact cast as last time we had this on she fell 3 times one week which is definitely not normal for her. 09/18/18 upon evaluation today the patient actually appears to be doing about the same in regard to her toe ulcer. She did not contact [facility] as I asked her to even though I had given her the prescription. In fact she actually states that she has no idea where the prescription is. She did apparently call [facility] and they told her that all she needed to do was bring the prescription in order to be able to be seen and work on getting the AFO for her left foot. With all that being said she still does not have an appointment and I'm not sure were things  stand that regard. I will give her [name] new prescription today in order to contact them to get this set up. 09/25/18 on evaluation today patient actually appears to be doing about the same in regard to her toes ulcer. She does have [name] small areas which seems to have [name] lot of callous buildup around the edge of the wound which is going to need sharp debridement today. She still is waiting to be scheduled for evaluation with [facility] for possibility of an AFO. She states there supposed to call her tomorrow to get this set up. Unfortunately it does appear that her foot specifically the toe area is showing signs of erythema. There does not appear to be any systemic infection which is in these good news. SERI, KIMMER [name]. ([id number]) 10/02/18 on evaluation today patient actually appears to be doing about the same in regard to her toe ulcer. This really has not done too well although it's not significantly larger it's also not significantly smaller. She has been tolerating the dressing changes without complication. She actually has her appointment with [facility] and [place] tomorrow to hopefully be measured for obtaining and AFO splint. I think this would be helpful preventing this from reoccurring. We had contemplated starting the cast this week although to be honest I am reluctant to do that as she's been having nausea, vomiting, and seizure activity over the past three days. She has [name] history of seizures and have been told is nothing that can be done for these. With that being said I do believe that along with the seizures have the nausea vomiting which upon further questioning doesn't seem to be the normal for her and makes me concerned for the possibility of infection or something else going on. I discussed this with the patient and her mother during the office visit today. I do not feel the wound is effective but maybe something else. The responses this was "this just happens to her at times and we don't  know why". They did not seem to be interested in going to the hospital to have this checked out further. 10/09/18 on evaluation today patient presents for follow-up concerning her ongoing toe ulcer. She has been tolerating the dressing changes without complication. Fortunately there does not appear to be any evidence of infection which is great news however I do think that the patient would benefit from going ahead for with the total contact cast. She's actually in [name] wheelchair today she tells me that she will use her walker if we initiate the cast. I was very specific about the fact that if we were gonna do the cast I wanted to make sure that she was  using the walker in order to prevent any falls. She tells me she does not have stairs that she has to traverse on [name] regular basis at her home. She has not had any seizures since last week again that something that happens to her often she tells me she did talk to [employer] and they said that it may take up to three weeks to get the brace approved for her. Hopefully that will not take that long but nonetheless in the meantime I do think the cast could be of benefit. 10/12/18 on evaluation today patient appears to be doing rather well in regard to her toe ulcer. It's just been [name] few days and already this is significantly improved both as far as overall appearance and size. Fortunately there's no sign of infection. She is here for her first obligatory cast change. 10/19/18 Seen today for follow up and management of left great toe ulcer. Wound continues to show improvement. Noted small open area with seroussang drainage with palpation. Denies any increased pain or recent fevers during visit. She will continue calcium alginate with offloading shoe. Denies any questions or concerns during visit. 10/26/18 on evaluation today patient appears to be doing about the same as when I last saw her in regard to her wound bed. Fortunately there does not appear to be any  signs of infection. Unfortunately she continues to have [name] breakdown in regard to the toe region any time that she is not in the cast. It takes almost no time at all for this to happen. Nonetheless she still has not heard anything from the brace being made by [employer] as to when exactly this will be available to her. Fortunately there is no signs of infection at this time. 10/30/18 on evaluation today patient presents for application of the total contact cast as we just received him this morning. Fortunately we are gonna be able to apply this to her today which is great news. She continues to have no significant pain which is good news. Overall I do feel like things have been improving while she was the cast is when she doesn't have [name] cast that things get worse. She still has not really heard anything from [place] regarding her brace. 11/02/18 upon evaluation today patient's wound already appears to be doing significantly better which is good news. Fortunately there does not appear to be any signs of infection also good news. Overall I do think the total contact cast as before is helping to heal this area unfortunately it's just not gonna likely keep the area closed and healed without her getting her brace at least. Again the foot drop is [name] significant issue for her. 11/09/18 on evaluation today patient appears to be doing excellent in regard to her toe ulcer which in fact is completely healed. Fortunately we finally got the situation squared away with the paperwork which was needed to proceed with getting her brace approved by Medicaid. I have filled that out unfortunately that information has been sent to the orthopedic office that I worked at 2 1/2 years ago and not tired Current wound care measures. Fortunately she seems to be doing very well at this time. 11/23/18 on evaluation today patient appears to be doing More Poorly Compared to Last Time I Saw Her. At [facility] She Had Completely Healed.  Currently she is continuing to have issues with reopening. She states that she just found out that the brace was approved through [place] now she just has to go  get measured in order to have this fitted for her and then made. Subsequently she does not have an appointment for this yet that is going to complicate things we obviously cannot put her back in the cast if we do not have everything measured because they're not gonna be able to measure her foot while she is in the cast. 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 Obviously this is unfortunate and does have me somewhat worried as well. 11/30/18 on evaluation today patient's toe ulcer actually appears to be doing fairly well. The good news is she will be getting her brace in the shoes next week on Wednesday. Hopefully we will be able to get this to heal without having to go back in the cast however she may need the cast in order to get the wound completely heal and then go from there. Fortunately there's no signs of infection at this time. 12/07/18 on evaluation today patient fortunately did receive her brace and she states she could tell this definitely makes her walk better. With that being said she's been having issues with her toe where she noticed yesterday there was [name] lot of tissue that was loosing off this appears to be much larger than what it was previous. She also states that her leg has been read putting much across the top of her foot just about the ankle although this seems to be receiving somewhat. The total area is still red and appears to be someone infected as best I can tell. She is previously taken Bactrim and that may be [name] good option for her today as well. We are gonna see what I wound culture shows as well and I think that this is definitely appropriate. With that being said outside of the culture I still need to initiate  something in the interim and that's what I'm gonna go ahead and select Bactrim is [name] good option for her. 12/14/18 on evaluation today patient appears to be doing better in regard to her left great toe ulcer as compared to last week's evaluation. There's still some erythema although this is significantly improved which is excellent news. Overall I do believe that she is making good progress is still gonna take some time before she is where I would like her to be from the standpoint of being able to place her back into the total contact cast. Hopefully we will be where we need to be by next week. 12/21/18 on evaluation today patient actually appears to be doing poorly in regard to her toe ulcer. She's been tolerating the dressing changes without complication. Fortunately there's no signs of systemic infection although she does have [name] lot of drainage from the toe ulcer and this does seem to be causing some issues at this point. She does have erythema on the distal portion of her toe that appears to be likely cellulitis. 12/28/18 on evaluation today patient actually appears to be doing [name] little better in my pinion in regard to her toe ulcer. With that being said she still does have some evidence of infection at this time and for her culture she had both E. coli as well as enterococcus as organisms noted on evaluation. For that reason I think that though the Keflex likely has treated the E. coli rather well this has really done nothing for the enterococcus. We are going to have to initiate  treatment for this specifically. AASHA, DINA [name]. ([id number]) 01/04/19 on evaluation today patient's toe actually appears to be doing better from the standpoint of infection. She currently would like to see about putting the cash back on I think that this is appropriate as long as she takes care of it and keeps it from getting wet. She is gonna have some drainage we can definitely pass this up with Drawtex and alginate to  try to prevent as much drainage as possible from causing the problems. With that being said I do want to at least try her with the cast between now and Tuesday. If there any issues we can't continue to use it then I will discontinue the use of the cast at that point. 01/08/19 on evaluation today patient actually appears to be doing very well as far as her foot ulcer specifically the great toe on the left is concerned. She did have an area of rubbing on the medial aspect of her left ankle which again is from the cast. Fortunately there's no signs of infection at this point in this appears to be [name] very slight skin breakdown. The patient tells me she felt it rubbing but didn't think it was that bad. Fortunately there is no signs of active infection at this time which is good news. No fevers, chills, nausea, or vomiting noted at this time. 01/15/19 on evaluation today patient actually appears to be doing well in regard to her toe ulcer. Again as previous she seems to do well and she has the cast on which indicates to me that during the time she doesn't have [name] cast on she's putting way too much pressure on this region. Obviously I think that's gonna be an issue as with the current national emergency concerning the Covid-19 Virus it has been recommended that we discontinue the use of total contact casting by the chief medical officer of our company, Dr. Simona Huh. The reasoning is that if [name] patient becomes sick and cannot come into have the cast removed they could not just leave this on for an additional two weeks. Obviously the hospitals also do not want to receive patient's who are sick into the emergency department to potentially contaminate the region and spread the Covid-19 Virus among other sick individuals within the hospital system. Therefore at this point we are suspending the use of total contact cast until the current emergency subsides. This was all discussed with the patient today as well. 01/22/19  on evaluation today patient's wound on her left great toe appears to be doing slightly worse than previously noted last week. She tells me that she has been on this quite [name] bit in fact she tells me she's been awake for 38 straight hours. This is due to the fact that she's having to care for grandparents because nobody else will. She has been taking care of them for five the last seven days since I've seen her they both have dementia his is from [name] stroke and her grandmother's was progressive. Nonetheless she states even her mom who knows her condition and situation has only help two of those days to take care of them she's been taking care of the rest. Fortunately there does not appear to be any signs of active infection in regard to her toe at this point although obviously it doesn't look as good as it did previous. I think this is directly related to her not taking off the pressure and friction by way of taking things easy.  Though I completely understand what's going on. 01/29/19 on evaluation today patient's tools are actually appears to be showing some signs of improvement today compared to last week's evaluation as far as not necessarily the overall size of the wound but the fact that she has some new skin growth in between the two ends of the wound opening. Overall I feel like she has done well she states that she had [name] family member give her what sounds to be [name] CAM walker boot which has been helpful as well. 02/05/19 on evaluation today patient's wound bed actually appears to be doing significantly better in regard to her overall appearance of the size of the wound. With that being said she is still having an issue with offloading efficiently enough to get this to close. Apparently there is some signs of infection at this point as well unfortunately. Previously she's done well of Augmentin I really do not see anything that needs to be culture currently but there theme and cellulitis of the foot that  I'm seeing I'm gonna go ahead and place her on an antibiotic today to try to help clear this up. 02/12/2019 on evaluation today patient actually appears to be doing poorly in regard to her overall wound status. She tells me she has been using her offloading shoe but actually comes in today wearing her tennis shoe with the AFO brace. Again as I previously discussed with her this is really not sufficient to allow the area to heal appropriately. Nonetheless she continues to be somewhat noncompliant and I do wonder based on what she has told my nurse in the past as to whether or not [name] good portion of this noncompliance may be recreational drug and alcohol related. She has had [name] history of heroin overdose and this was fairly recently in the past couple of months that have been seeing her. Nonetheless overall I feel like her wound looks significantly worse today compared to what it was previous. She still has significant erythema despite the Augmentin I am not sure that this is an appropriate medication for her infection I am also concerned that the infection may have gone down into her bone. 02/19/19 on evaluation today patient actually appears to be doing about the same in regard to her toe ulcer. Unfortunately she continues to show signs of bone exposure and infection at this point. There does not appear to be any evidence of worsening of the infection but I'm also not really sure that it's getting significantly better. She is on the Augmentin which should be sufficient for the Staphylococcus aureus infection that she has at this point. With that being said she may need IV antibiotics to more appropriately treat this. We did have [name] discussion today about hyperbaric option therapy. 02/28/19 on evaluation today patient actually appears to be doing much worse in regard to the wound on her left great toe as compared to even my previous evaluation last week. Unfortunately this seems to be training in [name] pretty  poor direction. Her toe was actually now starting to angle laterally and I can actually see the entire joint area of the proximal portion of the digit where is the distal portion of the digit again is no longer even in contact with the joint line. Unfortunately there's [name] lot more necrotic tissue around the edge and the toe appears to be showing signs of becoming gangrenous in my pinion. I'm very concerned about were things stand at this point. She did see infectious disease  and they are planning to send in [name] prescription for Sivextro for her and apparently this has been approved. With that being said I don't think she should avoid taking this but at the same time I'm not sure that it's gonna be sufficient to save her toe at this point. She tells me that she still having to care for grandparents which I think is putting quite [name] bit of strain on her foot and specifically the total area and has caused this to break down even to [name] greater degree than would've otherwise been expected. 03/05/19 on evaluation today patient actually appears to be doing quite well in regard to her toe all things considering. She still has bone exposed but there appears to be much less your thing on overall the appearance of the wound and the toe itself is dramatically improved. She still does have some issues currently obviously with infection she did see vascular as well and there concerned that her blood flow to the toad. For that reason they are setting up for an angiogram next week. 03/14/19 on evaluation today patient appears to be doing very poor in regard to her toe and specifically in regard to the ulceration and the fact that she's starting to notice the toe was leaning even more towards the lateral aspect and the complete joint is visible on the proximal aspect of the joint. Nonetheless she's also noted [name] significant odor and the tip of the toe is turning more dark and necrotic appearing. Overall I think she is getting  worse not better as far as this is concerned. For that reason I am recommending at this point that she likely needs to be seen for likely amputation. READMISSION 03/19/2021 This is [name] patient that we cared for in this clinic for [name] prolonged period of time in 2019 and 2020 with [name] left foot and left first toe wound. 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 ([id number]) 04/09/20 by Dr. Luana Shu. In December 2021 she had an ulcer on her right great toe as well as the fourth and fifth toes. 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 She also had an angiogram at that time and underwent angioplasty of the right anterior tibial artery. In any case she claims that the wound on the right foot is closed I did not look at this today which was probably an oversight although I think that should be done next week. 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 According to Dr. Deborra Medina last review that she was out of the area being cared for by another physician but recently came back to his attention. 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 [name] culture of this area showed E. coli apparently before she came back to see Dr. Luana Shu she was supposed to be receiving antibiotics but she did not really take them. Nor is she offloading this area at all. Finally her last hemoglobin A1c listed in epic was in March 2022 at 14.1 she says things are [name] lot better since then although I am not sure. 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 She had this wound at the time. During this admission she had E. coli sepsis [name] TEE was negative. Past medical history  is extensive and includes type 2 diabetes with peripheral neuropathy cardiomyopathy with  an ejection fraction of 33%, hypertension, hyperlipidemia chronic renal failure stage III history of substance abuse with cocaine although she claims to be clean now verified by her mother. She is still [name] heavy cigarette smoker. She has [name] history of bipolar disorder seizure disorder ABI in our clinic was 1.05 6/1; left midfoot in the setting of [name] TMA done previously. Round circular wound with [name] "knuckle" of protruding tissue. The problem is that the knuckle was not attached to any of the surrounding granulation and this probed proximally widely I removed [name] large portion of this tissue. This wound goes with considerable undermining laterally. I do not feel any bone there was no purulence but this is [name] deep wound. 6/8; in spite of the debridement I did last week. She arrives with [name] wound looking exactly the same. [name] protruding "knuckle" of tissue nonadherent to most of the surrounding tissue. There is considerable depth around this from 6-12 o'clock at 2.7 cm and undermining of 1 cm. This does not look overtly infected and the x-ray I did last week was negative for any osseous abnormalities. We have been using silver collagen 6/15; deep tissue culture I did last week showed moderate staph aureus and moderate Pseudomonas. This will definitely require prolonged antibiotic therapy. The pathology on the protuberant area was negative for malignancy fungus etc. 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 We have been using silver collagen. I am going to be prescribing Levaquin for 2 weeks. Her CT scan of the foot is down for 7/5 6/22; CT scan of the foot on 7 5. She says she has hardware in the left leg from her previous fracture. She is on the Levaquin for the deep tissue culture I did that showed methicillin sensitive staph aureus and Pseudomonas. I gave her [name] 2-week supply and she will have another week. She arrives in clinic today with the same  protuberant tissue however this is nonadherent to the tissue surrounding it. I am really at [name] loss to explain this unless there is underlying deep tissue infection 6/29; patient presents for 1 week follow-up. She has been using collagen to the wound bed. She reports taking her antibiotics as prescribed.She has no complaints or issues today. She denies signs of infection. 7/6; patient presents for one week followup. She has been using collagen to the wound bed. She states she is taking Levaquin however at times she is not able to keep it down. She denies signs of infection. 7/13; patient presents for 1 week follow-up. She has been using silver alginate to the wound bed. She still has nausea when taking her antibiotics. She denies signs of infection. 7/20; patient presents for 1 week follow-up. She has been using silver alginate with gentamicin cream to the wound bed. She denies any issues and has no complaints today. She denies signs of infection. 7/27; patient presents for 1 week follow-up. She continues to use silver alginate with gentamicin cream to the wound bed. She reports starting her antibiotics. She has no issues or complaints. Overall she reports stability to the wound. 8/3; patient presents for 1 week follow-up. She has been using silver alginate with gentamicin cream to the wound bed. She reports completing all antibiotics. She has no issues or complaints today. She denies signs of infection. 8/17; patient presents for 2-week follow-up. He is to use silver alginate to the wound bed. She  has no issues or complaints today. She denies signs of infection. She reports her pain has improved in her foot since last clinic visit 8/24; patient presents for 1 week follow-up. She continues to use silver alginate to the wound bed. She has no issues or complaints. She denies signs of infection. Pain is stable. 9/7; patient presents for follow-up. She missed her last week appointment due to feeling  ill. She continues to use silver alginate. She has [name] new wound to the right lower extremity that is covered in eschar. She states It occurred over the past week and has no idea how it started. She currently denies signs of infection. 9/14; patient presents for follow-up. To the left foot wound she has been using gentamicin cream and silver alginate. To the right lower extremity wound she has been keeping this covered and has not obtain Santyl. 9/21; patient presents for follow-up. She reports using gentamicin cream and silver alginate to the left foot and Santyl to the right lower extremity wound. She has no issues or complaints today. She denies signs of infection. 9/28; patient presents for follow-up. She reports [name] new wound to her right heel. She states this occurred [name] few days ago and is progressively gotten worse. She has been trying to clean the area with [name] Q-tip and Santyl. She reports stability in the other 2 wounds. She has been using gentamicin cream and silver alginate to the left foot and Santyl to the right lower extremity wound. 10/12; patient presents for follow-up. She reports improvement to the wound beds. She is seeing vein and vascular to discuss the potential of [name] left BKA. She states they are going to do an arteriogram. She continues to use silver alginate with dressing changes to her wounds. 11/2; patient presents for follow-up. She states she has not been doing dressing changes to the wound beds. She states she is not able to offload the areas. She reports chronic pain to her left foot wound. Electronic Signature(s) Signed: 08/25/2021 1:46:27 PM By: Heather Shan DO Entered By: [name] on 08/25/2021 12:43:22 [name] ([id number]) -------------------------------------------------------------------------------- Physical Exam Details Patient Name: [name] Date of Service: 08/25/2021 11:00 AM Medical Record Number: [id number] Patient Account  Number: [id number] Date of Birth/Sex: 1976/01/06 (45 y.o. F) Treating RN: Carlene Coria Primary Care Provider: Raelene Bott Other Clinician: Referring Provider: Raelene Bott Treating Provider/Extender: Yaakov Guthrie in Treatment: 22 Constitutional . Cardiovascular . Psychiatric . Notes Left foot (Transmetatarsal amputation): Wound to the plantar aspect that has granulation tissue and nonviable tissue present. Increased redness and warmth to the dorsal aspect of the foot. Right lower extremity: To the anterior aspect there is an open wound with Granulation tissue and nonviable tissue present. Right foot: To the heel there is an open wound with Granulation tissue present. Circumferential callus. No undermining noted. Electronic Signature(s) Signed: 08/25/2021 1:46:27 PM By: Heather Shan DO Entered By: [name] on 08/25/2021 12:46:18 [name] ([id number]) -------------------------------------------------------------------------------- Physician Orders Details Patient Name: [name] Date of Service: 08/25/2021 11:00 AM Medical Record Number: [id number] Patient Account Number: [id number] Date of Birth/Sex: [birth year] (44 y.o. F) Treating RN: Carlene Coria Primary Care Provider: Raelene Bott Other Clinician: Referring Provider: Raelene Bott Treating Provider/Extender: Yaakov Guthrie in Treatment: 19 Verbal / Phone Orders: No Diagnosis Coding Follow-up Appointments o Return Appointment in 1 week. Bathing/ Shower/ Hygiene o Clean wound with Normal Saline or wound cleanser. Off-Loading Left Lower Extremity o Open toe surgical shoe with peg assist.   o Turn and reposition every 2 hours o Other: - peg shoe right Medications-Please add to medication list. o P.O. Antibiotics Wound Treatment Wound #7 - Foot Wound Laterality: Plantar, Left, Midline Cleanser: Soap and Water 1 x Per Day/30 Days Discharge Instructions: Gently  cleanse wound with antibacterial soap, rinse and pat dry prior to dressing wounds Primary Dressing: Silvercel Small 2x2 (in/in) 1 x Per Day/30 Days Discharge Instructions: Apply to wound bed Secondary Dressing: ABD Pad 5x9 (in/in) 1 x Per Day/30 Days Discharge Instructions: Cover with ABD pad on top of dressing Secondary Dressing: Gauze 1 x Per Day/30 Days Discharge Instructions: To hold dressing in place Secured With: Kerlix Roll Sterile or Non-Sterile 6-ply 4.5x4 (yd/yd) 1 x Per Day/30 Days Discharge Instructions: Apply Kerlix as directed Wound #8 - Lower Leg Wound Laterality: Right, Anterior Cleanser: Normal Saline 1 x Per Day/30 Days Discharge Instructions: Wash your hands with soap and water. Remove old dressing, discard into plastic bag and place into trash. Cleanse the wound with Normal Saline prior to applying [name] clean dressing using gauze sponges, not tissues or cotton balls. Do not scrub or use excessive force. Pat dry using gauze sponges, not tissue or cotton balls. Topical: Santyl Collagenase Ointment, 30 (gm), tube 1 x Per Day/30 Days Discharge Instructions: apply nickel thick to wound bed only Secondary Dressing: [place] Dressing, 4x4 (in/in) 1 x Per Day/30 Days Discharge Instructions: Apply over dressing to secure in place. Wound #9 - Calcaneus Wound Laterality: Right Cleanser: Soap and Water 1 x Per Day/30 Days Discharge Instructions: Gently cleanse wound with antibacterial soap, rinse and pat dry prior to dressing wounds Primary Dressing: [place] Small 2x2 (in/in) 1 x Per Day/30 Days Discharge Instructions: Apply to wound bed Secondary Dressing: ABD Pad 5x9 (in/in) 1 x Per Day/30 Days Discharge Instructions: Cover with ABD pad on top of dressing Altemus, Jailyne [name]. ([id number]) Secondary Dressing: Gauze 1 x Per Day/30 Days Discharge Instructions: To hold dressing in place Secured With: Kerlix Roll Sterile or Non-Sterile 6-ply 4.5x4 (yd/yd) 1 x Per Day/30  Days Discharge Instructions: Apply Kerlix as directed Patient Medications Allergies: erythromycin base Notifications Medication Indication Start End ciprofloxacin HCl 08/25/2021 DOSE 1 - oral 750 mg tablet - 1 tablet oral BID x 7 days Electronic Signature(s) Signed: 08/25/2021 1:04:14 PM By: Heather Shan DO Entered By: [name] on 08/25/2021 13:04:12 Rodriguez, Suarez ([id number]) -------------------------------------------------------------------------------- Problem List Details Patient Name: [name] Date of Service: 08/25/2021 11:00 AM Medical Record Number: [id number] Patient Account Number: [id number] Date of Birth/Sex: Aug 14, 1976 (45 y.o. 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 03/19/2021 No Yes severity S81.801D Unspecified open wound, right lower leg, subsequent encounter 07/07/2021 No Yes E11.621 Type 2 diabetes mellitus with foot ulcer 03/19/2021 No Yes E11.42 Type 2 diabetes mellitus with diabetic polyneuropathy 03/19/2021 No Yes S91.301A Unspecified open wound, right foot, initial encounter 07/21/2021 No Yes Inactive Problems ICD-10 Code Description Active Date Inactive Date S81.801A Unspecified open wound, right lower leg, initial encounter 06/30/2021 06/30/2021 Resolved Problems Electronic Signature(s) Signed: 08/25/2021 1:46:27 PM By: Heather Shan DO Entered By: [name] on 08/25/2021 12:37:51 Santillana, Weronika [name]. ([id number]) -------------------------------------------------------------------------------- Progress Note Details Patient Name: [name] Date of Service: 08/25/2021 11:00 AM Medical Record Number: [id number] Patient  Account Number:  [id number] Date of Birth/Sex: 10/04/1976 (45 y.o. F) Treating RN: Carlene Coria Primary Care Provider: Raelene Bott Other Clinician: Referring Provider: Raelene Bott Treating Provider/Extender: Yaakov Guthrie in Treatment: 22 Subjective Chief Complaint Information obtained from Patient 03/19/2021; patient referred by Dr. Luana Shu who has been looking after her left foot for quite [name] period of time for review of [name] nonhealing area in the left midfoot History of Present Illness (HPI) 01/18/18-She is here for initial evaluation of the left great toe ulcer. She is [name] poor historian in regards to timeframe in detail. She states approximately 4 weeks ago she lacerated her toe on something in the house. She followed up with her primary care who placed her on Bactrim and ultimately [name] second dose of Bactrim prior to coming to wound clinic. She states she has been treating the toe with peroxide, Betadine and [name] Band-Aid. She did not check her blood sugar this morning but checked it yesterday morning it was 327; she is unaware of [name] recent A1c and there are no current records. She saw Dr. she would've orthopedics last week for an old injury to the left ankle, she states he did not see her toe, nor did she bring it to his attention. She smokes approximately 1 pack cigarettes [name] day. Her social situation is concerning, she arrives this morning with her mother who appears extremely intoxicated/under the influence; her mother was asked to leave the room and be monitored by the patient's grandmother. The patient's aunt then accompanied the patient and the room throughout the rest of the appointment. We had [name] lengthy discussion regarding the deleterious effects of uncontrolled hyperglycemia and smoking as it relates to wound healing and overall health. She was strongly encouraged to decrease her smoking and get her diabetes under better control. She states she is currently on [name] diet and has cut  down her [facility] consumption. The left toe is erythematous, macerated and slightly edematous with malodor present. The edema in her left foot is below her baseline, there is no erythema streaking. We will treat her with Santyl, doxycycline; we have ordered and xray, culture and provided [name] Peg assist surgical shoe and cultured the wound. 01/25/18-She is here in follow-up evaluation for [name] left great toe ulcer and presents with an abscess to her suprapubic area. She states her blood sugars remain elevated, feeling "sick" and if levels are below 250, but she is trying. She has made no attempt to decrease her smoking stating that we "can't take away her food in her cigarettes". She has been compliant with offloading using the PEG assist you. She is using Santyl daily. the culture obtained last week grew staph aureus and Enterococcus faecalis; continues on the doxycycline and Augmentin was added on Monday. The suprapubic area has erythema, no femoral variation, purple discoloration, minimal induration, was accessed with [name] cotton tip applicator with sanguinopurulent drainage, this was cultured, I suspect the current antibiotic treatment will cover and we will not add anything to her current treatment plan. She was advised to go to urgent care or ER with any change in redness, induration or fever. 02/01/18-She is here in follow-up evaluation for left great toe ulcers and [name] new abdominal abscess from last week. She was able to use packing until earlier this week, where she "forgot it was there". She states she was feeling ill with GI symptoms last week and was not taking her antibiotic. She states her glucose levels have been predominantly less than 200, with occasional levels  between 200-250. She thinks this was contributing to her GI symptoms as they have resolved without intervention. There continues to be significant laceration to left toe, otherwise it clinically looks stable/improved. There is now less  superficial opening to the lateral aspect of the great toe that was residual blister. We will transition to [facility] to all wounds, she will continue her Augmentin. If there is no change or deterioration next week for reculture. 02/08/18-She is here in follow-up evaluation for left great toe ulcer and abdominal ulcer. There is an improvement in both wounds. She has been wrapping her left toe with coban, not by our direction, which has created an area of discoloration to the medial aspect; she has been advised to NOT use coban secondary to her neuropathy. She states her glucose levels have been high over this last week ranging from 200-350, she continues to smoke. She admits to being less compliant with her offloading shoe. We will continue with same treatment plan and she will follow-up next week. 02/15/18-She is here in follow-up evaluation for left great toe ulcer and abdominal ulcer. The abdominal ulcer is epithelialized. The left great toe ulcer is improved and all injury from last week using the Coban wrap is resolved, the lateral ulcer is healed. She admits to noncompliance with wearing offloading shoe and admits to glucose levels being greater than 300 most of the week. She continues to smoke and expresses no desire to quit. There is one area medially that probes deeper than it has historically, erythema to the toe and dorsal foot has consistently waxed and waned. There is no overt signs of cellulitis or infection but we will culture the wound for any occult infection given the new area of depth and erythema. We will hold off on sensitivities for initiation of antibiotic therapy. 02/22/18-She is here in follow up evaluation for left great toe ulcer. There is overall significant improvement in both wound appearance, erythema and edema with changes made last week. She was not initiated on antibiotic therapy. Culture obtained last week showed oxacillin sensitive staph aureus, sensitive to  clindamycin. Clindamycin has been called into the pharmacy but she has been instructed to hold off on initiation secondary to overall clinical improvement and her history of antibiotic intolerance. She has been instructed to contact the clinic with any noted changes/deterioration and the wound, erythema, edema and/or pain. She will follow-up next week. She continues to smoke and her glucose levels remain elevated >250; she admits to compliance with offloading shoe 03/01/18 on evaluation today patient appears to be doing fairly well in regard to her left first toe ulcer. She has been tolerating the dressing changes with the [facility] Dressing without complication and overall this has definitely showed signs of improvement according to records as well is what the patient tells me today. I'm very pleased in that regard. She is having no pain today 03/08/18 She is here for follow up evaluation of [name] left great toe ulcer. She remains non-compliant with glucose control and smoking cessation; glucose levels consistently >200. She states that she got new shoe inserts/peg assist. She admits to compliance with offloading. Since my last evaluation there is significant improvement. We will switch to prisma at this time and she will follow up next week. She is noted to be tachycardic at this appointment, heart rate 120s; she has [name] history of heart rate 70-130 according to our records. She admits to extreme agitation r/t personal issues; she was advised to monitor her heartrate and  contact her physician if it does not return to [name] more normal range (<100). She takes cardizem twice daily. 03/15/18-She is here in follow-up evaluation for left great toe ulcer. She remains noncompliant with glucose control and smoking cessation. She admits to compliance with wearing offloading shoe. The ulcer is improved/stable and we will continue with the same treatment plan and she will follow-up next week 03/22/18-She is here for  evaluation for left great toe ulcer. There continues to be significant improvement despite recurrent hyperglycemia (over 500 yesterday) and she continues to smoke. She has been compliant with offloading and we will continue with same treatment plan and she will follow-up next week. Lobello, Makenzi [name]. ([id number]) 03/29/18-She is here for evaluation for left great toe ulcer. Despite continuing to smoke and uncontrolled diabetes she continues to improve. She is compliant with offloading shoe. We will continue with the same treatment plan and she will follow-up next week 04/05/18- She is here in follow up evaluation for [name] left great toe ulcer; she presents with small pustule to left fifth toe (resembles ant bite). She admits to compliance with wearing offloading shoe; continues to smoke or have uncontrolled blood glucose control. There is more callus than usual with evidence of bleeding; she denies known trauma. 04/12/18-She is here for evaluation of left great toe ulcer. Despite noncompliance with glycemic control and smoking she continues to make improvement. She continues to wear offloading shoe. The pustule, that was identified last week, to the left fifth toe is resolved. She will follow-up in 2 weeks 05/03/18-she is seen in follow-up evaluation for [name] left great toe ulcer. She is compliant with offloading, otherwise noncompliant with glycemic control and smoking. She has plateaued and there is minimal improvement noted. We will transition to [facility], replaced the insert to her surgical shoe and she will follow-up in one week 05/10/18- She is here in follow up evaluation for [name] left great toe ulcer. It appears stable despite measurement change. We will continue with same treatment plan and follow up next week. 05/24/18-She is seen in follow-up evaluation for [name] left great toe ulcer. She remains compliant with offloading, has made significant improvement in her diet, decreasing the amount of sugar/soda.  She said her recent A1c was 10.9 which is lower than. She did see [name] diabetic nutritionist/educator yesterday. She continues to smoke. We will continue with the same treatment plan and she'll follow-up next week. 05/31/18- She is seen in follow-up evaluation for left great toe ulcer. She continues to remain compliant with offloading, continues to make improvement in her diet, increasing her water and decreasing the amount of sugar/soda. She does continue to smoke with no desire to quit. We will apply Prisma to the depth and Hydrofera Blue over. We have not received insurance authorization for oasis. She will follow up next week. 06/07/18-She is seen in follow-up evaluation for left great toe ulcer. It has stalled according to today's measurements although base appears stable. She says she saw [name] diabetic educator yesterday; her average blood sugars are less than 300 which is an improvement for her. She continues to smoke and states "that's my next step" She continues with water over soda. We will order for xray, culture and reinstate ace wrap compression prior to placing apligraf for next week. She is voicing no complaints or concerns. Her dressing will change to iodoflex over the next week in preparation for apligraf. 06/14/18-She is seen in follow-up evaluation for left great toe ulcer. Plain film x-ray performed last week  was negative for osteomyelitis. Wound culture obtained last week grew strep B and OSSA; she is initiated on keflex and cefdinir today; there is erythema to the toe which could be from ace wrap compression, she has [name] history of wrapping too tight and has has been encouraged to maintain ace wraps that we place today. We will hold off on application of apligraf today, will apply next week after antibiotic therapy has been initiated. She admits today that she has resumed taking [name] shower with her foot/toe submerged in water, she has been reminded to keep foot/toe out of the bath water. She  will be seen in follow up next week 06/21/18-she is seen in follow-up evaluation for left great toe ulcer. She is tolerating antibiotic therapy with no GI disturbance. The wound is stable. Apligraf was applied today. She has been decreasing her smoking, only had 4 cigarettes yesterday and 1 today. She continues being more compliant in diabetic diet. She will follow-up next week for evaluation of site, if stable will remove at 2 weeks. 06/28/18- She is here in follow up evalution. Apligraf was placed last week, she states the dressing fell off on Tuesday and she was dressing with hydrofera blue. She is healed and will be discharged from the clinic today. She has been instructed to continue with smoking cessation, continue monitoring glucose levels, offloading for an additional 4 weeks and continue with hydrofera blue for additional two weeks for any possible microscopic opening. Readmission: 08/07/18 on evaluation today patient presents for reevaluation concerning the ulcer of her right great toe. She was previously discharged on 06/28/18 healed. Nonetheless she states that this began to show signs of drainage she subsequently went to her primary care provider. Subsequently an x-ray was performed on 08/01/18 which was negative. The patient was also placed on antibiotics at that time. Fortunately they should have been effective for the infection. Nonetheless she's been experiencing some improvement but still has [name] lot of drainage coming from the wound itself. 08/14/18 on evaluation today patient's wound actually does show signs of improvement in regard to the erythema at this point. She has completed the antibiotics. With that being said we did discuss the possibility of placing her in [name] total contact cast as of today although I think that I may want to give this just [name] little bit more time to ensure nothing recurrence as far as her infection is concerned. I do not want to put in the cast and risk  infection at that time if things are not completely resolved. With that being said she is gonna require some debridement today. 08/21/18 on evaluation today patient actually appears to be doing okay in regard to her toe ulcer. She's been tolerating the dressing changes without complication. With that being said it does appear that she is ready and in fact I think it's appropriate for [place] to go ahead and initiate the total contact cast today. Nonetheless she will require some sharp debridement to prepare the wound for application. Overall I feel like things have been progressing well but we do need to do something to get this to close more readily. 08/24/18 patient seen today for reevaluation after having had the total contact cast applied on Tuesday. She seems to have done very well the wound appears to be doing great and overall I'm pleased with the progress that she's made. There were no abnormal areas of rubbing from the cast on her lower extremity. 08/30/18 on evaluation today patient actually appears to be  completely healed in regard to her plantar toe ulcer. She tells me at this point she's been having [name] lot of issues with the cast. She almost fell [name] couple of times the state shall the step of her dog [name] couple times as well. This is been [name] very frustrating process for her other nonetheless she has completely healed the wound which is excellent news. Overall there does not appear to be the evidence of infection at this time which is great news. 09/11/18 evaluation today patient presents for follow-up concerning her great toe ulcer on the left which has unfortunately reopened since I last saw her which was only [name] couple of weeks ago. Unfortunately she was not able to get in to get the shoe and potentially the AFO that's gonna be necessary due to her left foot drop. She continues with offloading shoe but this is not enough to prevent her from reopening it appears. When we last had her in the total  contact cast she did well from [name] healing standpoint but unfortunately the wound reopened as soon as she came out of the cast within just [name] couple of weeks. 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 This is also part of the reason why she falls on [name] regular basis. I just do not believe that is gonna be safe for [place] to reinitiate the total contact cast as last time we had this on she fell 3 times one week which is definitely not normal for her. 09/18/18 upon evaluation today the patient actually appears to be doing about the same in regard to her toe ulcer. She did not contact [facility] as I asked her to even though I had given her the prescription. In fact she actually states that she has no idea where the prescription is. She did apparently call [facility] and they told her that all she needed to do was bring the prescription in order to be able to be seen and work on getting the AFO for her left foot. With all that being said she still does not have an appointment and I'm not sure were things stand that regard. I will give her [name] new prescription today in order to contact them to get this set up. KERRI, KOVACIK [name]. ([id number]) 09/25/18 on evaluation today patient actually appears to be doing about the same in regard to her toes ulcer. She does have [name] small areas which seems to have [name] lot of callous buildup around the edge of the wound which is going to need sharp debridement today. She still is waiting to be scheduled for evaluation with [facility] for possibility of an AFO. She states there supposed to call her tomorrow to get this set up. Unfortunately it does appear that her foot specifically the toe area is showing signs of erythema. There does not appear to be any systemic infection which is in these good  news. 10/02/18 on evaluation today patient actually appears to be doing about the same in regard to her toe ulcer. This really has not done too well although it's not significantly larger it's also not significantly smaller. She has been tolerating the dressing changes without complication. She actually has her appointment with [facility] and [facility] tomorrow to hopefully be measured for obtaining and AFO splint.  I think this would be helpful preventing this from reoccurring. We had contemplated starting the cast this week although to be honest I am reluctant to do that as she's been having nausea, vomiting, and seizure activity over the past three days. She has [name] history of seizures and have been told is nothing that can be done for these. With that being said I do believe that along with the seizures have the nausea vomiting which upon further questioning doesn't seem to be the normal for her and makes me concerned for the possibility of infection or something else going on. I discussed this with the patient and her mother during the office visit today. I do not feel the wound is effective but maybe something else. The responses this was "this just happens to her at times and we don't know why". They did not seem to be interested in going to the hospital to have this checked out further. 10/09/18 on evaluation today patient presents for follow-up concerning her ongoing toe ulcer. She has been tolerating the dressing changes without complication. Fortunately there does not appear to be any evidence of infection which is great news however I do think that the patient would benefit from going ahead for with the total contact cast. She's actually in [name] wheelchair today she tells me that she will use her walker if we initiate the cast. I was very specific about the fact that if we were gonna do the cast I wanted to make sure that she was using the walker in order to prevent any falls. She tells me she does  not have stairs that she has to traverse on [name] regular basis at her home. She has not had any seizures since last week again that something that happens to her often she tells me she did talk to [employer] and they said that it may take up to three weeks to get the brace approved for her. Hopefully that will not take that long but nonetheless in the meantime I do think the cast could be of benefit. 10/12/18 on evaluation today patient appears to be doing rather well in regard to her toe ulcer. It's just been [name] few days and already this is significantly improved both as far as overall appearance and size. Fortunately there's no sign of infection. She is here for her first obligatory cast change. 10/19/18 Seen today for follow up and management of left great toe ulcer. Wound continues to show improvement. Noted small open area with seroussang drainage with palpation. Denies any increased pain or recent fevers during visit. She will continue calcium alginate with offloading shoe. Denies any questions or concerns during visit. 10/26/18 on evaluation today patient appears to be doing about the same as when I last saw her in regard to her wound bed. Fortunately there does not appear to be any signs of infection. Unfortunately she continues to have [name] breakdown in regard to the toe region any time that she is not in the cast. It takes almost no time at all for this to happen. Nonetheless she still has not heard anything from the brace being made by [employer] as to when exactly this will be available to her. Fortunately there is no signs of infection at this time. 10/30/18 on evaluation today patient presents for application of the total contact cast as we just received him this morning. Fortunately we are gonna be able to apply this to her today which is great news. She continues to have no  significant pain which is good news. Overall I do feel like things have been improving while she was the cast is when she doesn't  have [name] cast that things get worse. She still has not really heard anything from [place] regarding her brace. 11/02/18 upon evaluation today patient's wound already appears to be doing significantly better which is good news. Fortunately there does not appear to be any signs of infection also good news. Overall I do think the total contact cast as before is helping to heal this area unfortunately it's just not gonna likely keep the area closed and healed without her getting her brace at least. Again the foot drop is [name] significant issue for her. 11/09/18 on evaluation today patient appears to be doing excellent in regard to her toe ulcer which in fact is completely healed. Fortunately we finally got the situation squared away with the paperwork which was needed to proceed with getting her brace approved by Medicaid. I have filled that out unfortunately that information has been sent to the orthopedic office that I worked at 2 1/2 years ago and not tired Current wound care measures. Fortunately she seems to be doing very well at this time. 11/23/18 on evaluation today patient appears to be doing More Poorly Compared to Last Time I Saw Her. At [facility] She Had Completely Healed. Currently she is continuing to have issues with reopening. She states that she just found out that the brace was approved through Medicaid now she just has to go get measured in order to have this fitted for her and then made. Subsequently she does not have an appointment for this yet that is going to complicate things we obviously cannot put her back in the cast if we do not have everything measured because they're not gonna be able to measure her foot while she is in the cast. 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 Obviously this is unfortunate and does have me somewhat worried as well. 11/30/18 on evaluation today patient's toe  ulcer actually appears to be doing fairly well. The good news is she will be getting her brace in the shoes next week on Wednesday. Hopefully we will be able to get this to heal without having to go back in the cast however she may need the cast in order to get the wound completely heal and then go from there. Fortunately there's no signs of infection at this time. 12/07/18 on evaluation today patient fortunately did receive her brace and she states she could tell this definitely makes her walk better. With that being said she's been having issues with her toe where she noticed yesterday there was [name] lot of tissue that was loosing off this appears to be much larger than what it was previous. She also states that her leg has been read putting much across the top of her foot just about the ankle although this seems to be receiving somewhat. The total area is still red and appears to be someone infected as best I can tell. She is previously taken Bactrim and that may be [name] good option for her today as well. We are gonna see what I wound culture shows as well and I think that this is definitely appropriate. With that being said outside of the culture I still need to initiate something in the interim and that's what I'm gonna go  ahead and select Bactrim is [name] good option for her. 12/14/18 on evaluation today patient appears to be doing better in regard to her left great toe ulcer as compared to last week's evaluation. There's still some erythema although this is significantly improved which is excellent news. Overall I do believe that she is making good progress is still gonna take some time before she is where I would like her to be from the standpoint of being able to place her back into the total contact cast. Hopefully we will be where we need to be by next week. 12/21/18 on evaluation today patient actually appears to be doing poorly in regard to her toe ulcer. She's been tolerating the dressing  changes without complication. Fortunately there's no signs of systemic infection although she does have [name] lot of drainage from the toe ulcer and this does Pieczynski, [name] ([id number]) seem to be causing some issues at this point. She does have erythema on the distal portion of her toe that appears to be likely cellulitis. 12/28/18 on evaluation today patient actually appears to be doing [name] little better in my pinion in regard to her toe ulcer. With that being said she still does have some evidence of infection at this time and for her culture she had both E. coli as well as enterococcus as organisms noted on evaluation. For that reason I think that though the Keflex likely has treated the E. coli rather well this has really done nothing for the enterococcus. We are going to have to initiate treatment for this specifically. 01/04/19 on evaluation today patient's toe actually appears to be doing better from the standpoint of infection. She currently would like to see about putting the cash back on I think that this is appropriate as long as she takes care of it and keeps it from getting wet. She is gonna have some drainage we can definitely pass this up with Drawtex and alginate to try to prevent as much drainage as possible from causing the problems. With that being said I do want to at least try her with the cast between now and Tuesday. If there any issues we can't continue to use it then I will discontinue the use of the cast at that point. 01/08/19 on evaluation today patient actually appears to be doing very well as far as her foot ulcer specifically the great toe on the left is concerned. She did have an area of rubbing on the medial aspect of her left ankle which again is from the cast. Fortunately there's no signs of infection at this point in this appears to be [name] very slight skin breakdown. The patient tells me she felt it rubbing but didn't think it was that bad. Fortunately there is no signs of  active infection at this time which is good news. No fevers, chills, nausea, or vomiting noted at this time. 01/15/19 on evaluation today patient actually appears to be doing well in regard to her toe ulcer. Again as previous she seems to do well and she has the cast on which indicates to me that during the time she doesn't have [name] cast on she's putting way too much pressure on this region. Obviously I think that's gonna be an issue as with the current national emergency concerning the Covid-19 Virus it has been recommended that we discontinue the use of total contact casting by the chief medical officer of our company, Dr. Simona Huh. The reasoning is that if [name] patient becomes sick  and cannot come into have the cast removed they could not just leave this on for an additional two weeks. Obviously the hospitals also do not want to receive patient's who are sick into the emergency department to potentially contaminate the region and spread the Covid-19 Virus among other sick individuals within the hospital system. Therefore at this point we are suspending the use of total contact cast until the current emergency subsides. This was all discussed with the patient today as well. 01/22/19 on evaluation today patient's wound on her left great toe appears to be doing slightly worse than previously noted last week. She tells me that she has been on this quite [name] bit in fact she tells me she's been awake for 38 straight hours. This is due to the fact that she's having to care for grandparents because nobody else will. She has been taking care of them for five the last seven days since I've seen her they both have dementia his is from [name] stroke and her grandmother's was progressive. Nonetheless she states even her mom who knows her condition and situation has only help two of those days to take care of them she's been taking care of the rest. Fortunately there does not appear to be any signs of active infection in regard  to her toe at this point although obviously it doesn't look as good as it did previous. I think this is directly related to her not taking off the pressure and friction by way of taking things easy. Though I completely understand what's going on. 01/29/19 on evaluation today patient's tools are actually appears to be showing some signs of improvement today compared to last week's evaluation as far as not necessarily the overall size of the wound but the fact that she has some new skin growth in between the two ends of the wound opening. Overall I feel like she has done well she states that she had [name] family member give her what sounds to be [name] CAM walker boot which has been helpful as well. 02/05/19 on evaluation today patient's wound bed actually appears to be doing significantly better in regard to her overall appearance of the size of the wound. With that being said she is still having an issue with offloading efficiently enough to get this to close. Apparently there is some signs of infection at this point as well unfortunately. Previously she's done well of Augmentin I really do not see anything that needs to be culture currently but there theme and cellulitis of the foot that I'm seeing I'm gonna go ahead and place her on an antibiotic today to try to help clear this up. 02/12/2019 on evaluation today patient actually appears to be doing poorly in regard to her overall wound status. She tells me she has been using her offloading shoe but actually comes in today wearing her tennis shoe with the AFO brace. Again as I previously discussed with her this is really not sufficient to allow the area to heal appropriately. Nonetheless she continues to be somewhat noncompliant and I do wonder based on what she has told my nurse in the past as to whether or not [name] good portion of this noncompliance may be recreational drug and alcohol related. She has had [name] history of heroin overdose and this was fairly recently  in the past couple of months that have been seeing her. Nonetheless overall I feel like her wound looks significantly worse today compared to what it was previous. She  still has significant erythema despite the Augmentin I am not sure that this is an appropriate medication for her infection I am also concerned that the infection may have gone down into her bone. 02/19/19 on evaluation today patient actually appears to be doing about the same in regard to her toe ulcer. Unfortunately she continues to show signs of bone exposure and infection at this point. There does not appear to be any evidence of worsening of the infection but I'm also not really sure that it's getting significantly better. She is on the Augmentin which should be sufficient for the Staphylococcus aureus infection that she has at this point. With that being said she may need IV antibiotics to more appropriately treat this. We did have [name] discussion today about hyperbaric option therapy. 02/28/19 on evaluation today patient actually appears to be doing much worse in regard to the wound on her left great toe as compared to even my previous evaluation last week. Unfortunately this seems to be training in [name] pretty poor direction. Her toe was actually now starting to angle laterally and I can actually see the entire joint area of the proximal portion of the digit where is the distal portion of the digit again is no longer even in contact with the joint line. Unfortunately there's [name] lot more necrotic tissue around the edge and the toe appears to be showing signs of becoming gangrenous in my pinion. I'm very concerned about were things stand at this point. She did see infectious disease and they are planning to send in [name] prescription for Sivextro for her and apparently this has been approved. With that being said I don't think she should avoid taking this but at the same time I'm not sure that it's gonna be sufficient to save her toe at this  point. She tells me that she still having to care for grandparents which I think is putting quite [name] bit of strain on her foot and specifically the total area and has caused this to break down even to [name] greater degree than would've otherwise been expected. 03/05/19 on evaluation today patient actually appears to be doing quite well in regard to her toe all things considering. She still has bone exposed but there appears to be much less your thing on overall the appearance of the wound and the toe itself is dramatically improved. She still does have some issues currently obviously with infection she did see vascular as well and there concerned that her blood flow to the toad. For that reason they are setting up for an angiogram next week. 03/14/19 on evaluation today patient appears to be doing very poor in regard to her toe and specifically in regard to the ulceration and the fact that she's starting to notice the toe was leaning even more towards the lateral aspect and the complete joint is visible on the proximal aspect of the joint. Nonetheless she's also noted [name] significant odor and the tip of the toe is turning more dark and necrotic appearing. Overall I think she is getting worse not better as far as this is concerned. For that reason I am recommending at this point that she likely needs to be seen for likely amputation. MARIKAY, ROADS ([id number]) READMISSION 03/19/2021 This is [name] patient that we cared for in this clinic for [name] prolonged period of time in 2019 and 2020 with [name] left foot and left first toe wound. I believe she ultimately became infected and underwent [name] left first toe  amputation. 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 Luana Shu. In December 2021 she had an ulcer on her right great toe as well as the fourth and fifth toes. 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 She also had an angiogram at that time and  underwent angioplasty of the right anterior tibial artery. In any case she claims that the wound on the right foot is closed I did not look at this today which was probably an oversight although I think that should be done next week. 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 According to Dr. Deborra Medina last review that she was out of the area being cared for by another physician but recently came back to his attention. 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 [name] culture of this area showed E. coli apparently before she came back to see Dr. Luana Shu she was supposed to be receiving antibiotics but she did not really take them. Nor is she offloading this area at all. Finally her last hemoglobin A1c listed in epic was in March 2022 at 14.1 she says things are [name] lot better since then although I am not sure. 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 She had this wound at the time. During this admission she had E. coli sepsis [name] TEE was negative. 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 She is still [name] heavy cigarette smoker. She has [name] history of bipolar disorder seizure disorder ABI in our clinic was 1.05 6/1; left midfoot in the setting of [name] TMA done previously. Round circular wound with [name] "knuckle" of protruding tissue. The problem is that the knuckle was not attached to any of the surrounding granulation and this probed proximally widely I removed [name] large portion of this tissue. This wound goes with considerable undermining laterally. I do not feel any bone there was no purulence but this is [name] deep wound. 6/8; in  spite of the debridement I did last week. She arrives with [name] wound looking exactly the same. [name] protruding "knuckle" of tissue nonadherent to most of the surrounding tissue. There is considerable depth around this from 6-12 o'clock at 2.7 cm and undermining of 1 cm. This does not look overtly infected and the x-ray I did last week was negative for any osseous abnormalities. We have been using silver collagen 6/15; deep tissue culture I did last week showed moderate staph aureus and moderate Pseudomonas. This will definitely require prolonged antibiotic therapy. The pathology on the protuberant area was negative for malignancy fungus etc. 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 We have been using silver collagen. I am going to be prescribing Levaquin for 2 weeks. Her CT scan of the foot is down for 7/5 6/22; CT scan of the foot on 7 5. She says she has hardware in the left leg from her previous fracture. She is on the Levaquin for the deep tissue culture I did that showed methicillin sensitive staph aureus and Pseudomonas. I gave her [name] 2-week supply and she will have another week. She arrives in  clinic today with the same protuberant tissue however this is nonadherent to the tissue surrounding it. I am really at [name] loss to explain this unless there is underlying deep tissue infection 6/29; patient presents for 1 week follow-up. She has been using collagen to the wound bed. She reports taking her antibiotics as prescribed.She has no complaints or issues today. She denies signs of infection. 7/6; patient presents for one week followup. She has been using collagen to the wound bed. She states she is taking Levaquin however at times she is not able to keep it down. She denies signs of infection. 7/13; patient presents for 1 week follow-up. She has been using silver alginate to the wound bed. She still has nausea when taking her antibiotics. She  denies signs of infection. 7/20; patient presents for 1 week follow-up. She has been using silver alginate with gentamicin cream to the wound bed. She denies any issues and has no complaints today. She denies signs of infection. 7/27; patient presents for 1 week follow-up. She continues to use silver alginate with gentamicin cream to the wound bed. She reports starting her antibiotics. She has no issues or complaints. Overall she reports stability to the wound. 8/3; patient presents for 1 week follow-up. She has been using silver alginate with gentamicin cream to the wound bed. She reports completing all antibiotics. She has no issues or complaints today. She denies signs of infection. 8/17; patient presents for 2-week follow-up. He is to use silver alginate to the wound bed. She has no issues or complaints today. She denies signs of infection. She reports her pain has improved in her foot since last clinic visit 8/24; patient presents for 1 week follow-up. She continues to use silver alginate to the wound bed. She has no issues or complaints. She denies signs of infection. Pain is stable. 9/7; patient presents for follow-up. She missed her last week appointment due to feeling ill. She continues to use silver alginate. She has [name] new wound to the right lower extremity that is covered in eschar. She states It occurred over the past week and has no idea how it started. She currently denies signs of infection. 9/14; patient presents for follow-up. To the left foot wound she has been using gentamicin cream and silver alginate. To the right lower extremity wound she has been keeping this covered and has not obtain Santyl. 9/21; patient presents for follow-up. She reports using gentamicin cream and silver alginate to the left foot and Santyl to the right lower extremity wound. She has no issues or complaints today. She denies signs of infection. 9/28; patient presents for follow-up. She reports [name] new  wound to her right heel. She states this occurred [name] few days ago and is progressively gotten worse. She has been trying to clean the area with [name] Q-tip and Santyl. She reports stability in the other 2 wounds. She has been using gentamicin cream and silver alginate to the left foot and Santyl to the right lower extremity wound. 10/12; patient presents for follow-up. She reports improvement to the wound beds. She is seeing vein and vascular to discuss the potential of [name] left BKA. She states they are going to do an arteriogram. She continues to use silver alginate with dressing changes to her wounds. 11/2; patient presents for follow-up. She states she has not been doing dressing changes to the wound beds. She states she is not able to offload the areas. She reports chronic pain to her left foot wound.  Patient History Information obtained from Patient. Social History Current every day smoker, Marital Status - Single, Alcohol Use - Never, Drug Use - Prior History, Caffeine Use - Daily. Medical History CHYLA, SCHLENDER ([id number]) Ear/Nose/Mouth/Throat Patient has history of Chronic sinus problems/congestion, Middle ear problems Hematologic/Lymphatic Patient has history of Anemia Respiratory Patient has history of Chronic Obstructive Pulmonary Disease (COPD) Cardiovascular Patient has history of Congestive Heart Failure - EF 33% Endocrine Patient has history of Type II Diabetes Genitourinary Patient has history of End Stage Renal Disease Integumentary (Skin) Patient has history of History of pressure wounds Neurologic Patient has history of Neuropathy - feel and legs Objective Constitutional Vitals Time Taken: 11:20 AM, Height: 69 in, Weight: 185 lbs, BMI: 27.3, Temperature: 98.2 F, Pulse: 117 bpm, Respiratory Rate: 20 breaths/min, Blood Pressure: 105/62 mmHg. General Notes: Left foot (Transmetatarsal amputation): Wound to the plantar aspect that has granulation tissue and nonviable  tissue present. Increased redness and warmth to the dorsal aspect of the foot. Right lower extremity: To the anterior aspect there is an open wound with Granulation tissue and nonviable tissue present. Right foot: To the heel there is an open wound with Granulation tissue present. Circumferential callus. No undermining noted. Integumentary (Hair, Skin) Wound #7 status is Open. Original cause of wound was Gradually Appeared. The date acquired was: 08/07/2020. The wound has been in treatment 22 weeks. The wound is located on the [place]. The wound measures 2.8cm length x 4.2cm width x 0.8cm depth; 9.236cm^2 area and 7.389cm^3 volume. There is Fat Layer (Subcutaneous Tissue) exposed. There is no tunneling or undermining noted. There is [name] medium amount of serosanguineous drainage noted. The wound margin is thickened. There is large (67-100%) red, pink, hyper - granulation within the wound bed. There is [name] small (1-33%) amount of necrotic tissue within the wound bed including Adherent Slough. Wound #8 status is Open. Original cause of wound was Gradually Appeared. The date acquired was: 06/30/2021. The wound has been in treatment 8 weeks. The wound is located on the Right,Anterior Lower Leg. The wound measures 4cm length x 1.7cm width x 0.2cm depth; 5.341cm^2 area and 1.068cm^3 volume. There is Fat Layer (Subcutaneous Tissue) exposed. There is no tunneling or undermining noted. There is [name] medium amount of serosanguineous drainage noted. There is medium (34-66%) red, pink granulation within the wound bed. There is [name] medium (34-66%) amount of necrotic tissue within the wound bed including Adherent Slough. Wound #9 status is Open. Original cause of wound was Gradually Appeared. The date acquired was: 07/18/2021. The wound has been in treatment 5 weeks. The wound is located on the Right Calcaneus. The wound measures 0.5cm length x 0.6cm width x 0.2cm depth; 0.236cm^2 area and 0.047cm^3 volume.  There is Fat Layer (Subcutaneous Tissue) exposed. There is no tunneling or undermining noted. There is [name] medium amount of serosanguineous drainage noted. The wound margin is flat and intact. There is medium (34-66%) granulation within the wound bed. There is [name] medium (34-66%) amount of necrotic tissue within the wound bed including Adherent Slough. 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 ([id number]) Unfortunately patient has not been able to take care of her wounds and aggressively offload the areas. Its not clear if she has ever truly been compliant with  recommendations given. At this time I debrided nonviable tissue. I recommended [name] Pegasys insert for the right foot to help with offloading. There is increased warmth and erythema to the left foot. There is also blue-green drainage suspicious for Pseudomonas on her dressing and wound. I will treat with ciprofloxacin. I did advise her to go to the ED if she has worsening redness, warmth or pain. She knows she is at high risk for amputation of her left foot. I did advise that if she is not able to take care of her right foot she is also at high risk for amputation to this area as well. She can continue Silver alginate to the plantar foot wounds and Santyl to the anterior right lower extremity wound. Follow-up in 1 week Procedures Wound #8 Pre-procedure diagnosis of Wound #8 is [name] Diabetic Wound/Ulcer of the Lower Extremity located on the Right,Anterior Lower Leg .Severity of Tissue Pre Debridement is: Fat layer exposed. There was [name] Excisional Skin/Subcutaneous Tissue Debridement with [name] total area of 6.8 sq cm performed by Heather Shan, MD.  With the following instrument(s): Curette to remove Viable and Non-Viable tissue/material. Material removed includes Subcutaneous Tissue, Slough, Skin: Dermis, and Skin: Epidermis after achieving pain control using Lidocaine 4% Topical Solution. No specimens were taken. [name] time out was conducted at 11:40, prior to the start of the procedure. [name] Minimum amount of bleeding was controlled with Pressure. The procedure was tolerated well with [name] pain level of 0 throughout and [name] pain level of 0 following the procedure. Post Debridement Measurements: 4cm length x 1.7cm width x 0.2cm depth; 1.068cm^3 volume. Character of Wound/Ulcer Post Debridement is improved. Severity of Tissue Post Debridement is: Fat layer exposed. Post procedure Diagnosis Wound #8: Same as Pre-Procedure Plan Follow-up Appointments: Return Appointment in 1 week. Bathing/ Shower/ Hygiene: Clean wound with Normal Saline or wound cleanser. Off-Loading: Open toe surgical shoe with peg assist. Turn and reposition every 2 hours Other: - peg shoe right Medications-Please add to medication list.: P.O. Antibiotics WOUND #7: - Foot Wound Laterality: Plantar, Left, Midline Cleanser: Soap and Water 1 x Per Day/30 Days Discharge Instructions: Gently cleanse wound with antibacterial soap, rinse and pat dry prior to dressing wounds Primary Dressing: Silvercel Small 2x2 (in/in) 1 x Per Day/30 Days Discharge Instructions: Apply to wound bed Secondary Dressing: ABD Pad 5x9 (in/in) 1 x Per Day/30 Days Discharge Instructions: Cover with ABD pad on top of dressing Secondary Dressing: Gauze 1 x Per Day/30 Days Discharge Instructions: To hold dressing in place Secured With: Kerlix Roll Sterile or Non-Sterile 6-ply 4.5x4 (yd/yd) 1 x Per Day/30 Days Discharge Instructions: Apply Kerlix as directed WOUND #8: - Lower Leg Wound Laterality: Right, Anterior Cleanser: Normal Saline 1 x Per Day/30 Days Discharge Instructions: Wash your hands with soap  and water. Remove old dressing, discard into plastic bag and place into trash. Cleanse the wound with Normal Saline prior to applying [name] clean dressing using gauze sponges, not tissues or cotton balls. Do not scrub or use excessive force. Pat dry using gauze sponges, not tissue or cotton balls. Topical: Santyl Collagenase Ointment, 30 (gm), tube 1 x Per Day/30 Days Discharge Instructions: apply nickel thick to wound bed only Secondary Dressing: [place] Dressing, 4x4 (in/in) 1 x Per Day/30 Days Discharge Instructions: Apply over dressing to secure in place. WOUND #9: - Calcaneus Wound Laterality: Right Cleanser: Soap and Water 1 x Per Day/30 Days Discharge Instructions: Gently cleanse wound with antibacterial soap, rinse and pat dry prior  to dressing wounds Primary Dressing: Silvercel Small 2x2 (in/in) 1 x Per Day/30 Days Discharge Instructions: Apply to wound bed Secondary Dressing: ABD Pad 5x9 (in/in) 1 x Per Day/30 Days Discharge Instructions: Cover with ABD pad on top of dressing Secondary Dressing: Gauze 1 x Per Day/30 Days Discharge Instructions: To hold dressing in place Secured With: Kerlix Roll Sterile or Non-Sterile 6-ply 4.5x4 (yd/yd) 1 x Per Day/30 Days Discharge Instructions: Apply Kerlix as directed 1. In office sharp debridement 2. Silver alginate to the bilateral feet wounds 3. Santyl to the anterior right lower extremity wound Trentman, Tahari [name]. ([id number]) 4. Pegasys order 5. Aggressive offloading 6. Follow-up in 1 week Electronic Signature(s) Signed: 08/25/2021 1:46:27 PM By: Heather Shan DO Entered By: [name] on 08/25/2021 12:55:36 [name] ([id number]) -------------------------------------------------------------------------------- ROS/PFSH Details Patient Name: [name] Date of Service: 08/25/2021 11:00 AM Medical Record Number: [id number] Patient Account Number: [id number] Date of Birth/Sex: 1976/08/06 (45 y.o.  F) Treating RN: Carlene Coria Primary Care Provider: Raelene Bott Other Clinician: Referring Provider: Raelene Bott Treating Provider/Extender: Yaakov Guthrie in Treatment: 22 Information Obtained From Patient Ear/Nose/Mouth/Throat Medical History: Positive for: Chronic sinus problems/congestion; Middle ear problems Hematologic/Lymphatic Medical History: Positive for: Anemia Respiratory Medical History: Positive for: Chronic Obstructive Pulmonary Disease (COPD) Cardiovascular Medical History: Positive for: Congestive Heart Failure - EF 33% Endocrine Medical History: Positive for: Type II Diabetes Time with diabetes: 14 years Treated with: Insulin, Oral agents Blood sugar tested every day: No Blood sugar testing results: Bedtime: 176 Genitourinary Medical History: Positive for: End Stage Renal Disease Integumentary (Skin) Medical History: Positive for: History of pressure wounds Neurologic Medical History: Positive for: Neuropathy - feel and legs HBO Extended History Items Ear/Nose/Mouth/Throat: Ear/Nose/Mouth/Throat: Chronic sinus problems/congestion Middle ear problems Immunizations Pneumococcal Vaccine: Received Pneumococcal Vaccination: No Bye, [name] ([id number]) Implantable Devices No devices added Family and Social History Current every day smoker; Marital Status - Single; Alcohol Use: Never; Drug Use: Prior History; Caffeine Use: Daily Electronic Signature(s) Signed: 08/25/2021 1:46:27 PM By: Heather Shan DO Signed: 08/26/2021 5:07:02 PM By: Carlene Coria RN Entered By: [name] on 08/25/2021 12:43:35 [name] ([id number]) -------------------------------------------------------------------------------- SuperBill Details Patient Name: [name] Date of Service: 08/25/2021 Medical Record Number: [id number] Patient Account Number: [id number] Date of Birth/Sex: Mar 19, 1976 (45 y.o. F) Treating RN: Carlene Coria Primary  Care Provider: Raelene Bott Other Clinician: Referring Provider: Raelene Bott Treating Provider/Extender: Yaakov Guthrie in Treatment: 22 Diagnosis Coding ICD-10 Codes Code Description [phone] 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ity S81.801D Unspecified open wound, right lower leg, subsequent encounter E11.621 Type 2 diabetes mellitus with foot ulcer E11.42 Type 2 diabetes mellitus with diabetic polyneuropathy S91.301A Unspecified open wound, right foot, initial encounter L03.116 Cellulitis of left lower limb Facility Procedures CPT4 Code: [id number] Description: 85631 - DEB SUBQ TISSUE 20 SQ CM/< Modifier: Quantity: 1 CPT4 Code: Description: ICD-10 Diagnosis Description 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 S81.801D Unspecified open wound, right lower leg, subsequent encounter Modifier: severity Quantity: Physician Procedures CPT4 Code: 4970263 Description: 99214 - WC PHYS LEVEL 4 - EST PT Modifier: Quantity: 1 CPT4 Code: Description: ICD-10 Diagnosis Description L03.116 Cellulitis of left lower limb Modifier: Quantity: CPT4 Code: 7858850 Description: 11042 - WC PHYS SUBQ TISS 20 SQ CM Modifier: Quantity: 1 CPT4 Code: Description: ICD-10 Diagnosis Description 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 S81.801D Unspecified open wound, right lower leg,  subsequent encounter Modifier: severity Quantity: Electronic Signature(s) Signed: 08/25/2021 1:46:27 PM By: Heather Shan DO Entered By: [name] on 08/25/2021 13:04:26

## 2021-08-27 NOTE — Progress Notes (Signed)
NOVIS, LEAGUE ([id number]) Visit Report for 08/25/2021 Arrival Information Details Patient Name: [name], [name] Date of Service: 08/25/2021 11:00 AM Medical Record Number: [id number] Patient Account Number: [id number] Date of Birth/Sex: December 22, 1975 (45 y.o. F) Treating RN: Carlene Coria Primary Care Sakura Denis: Raelene Bott Other Clinician: Referring Orry Sigl: Raelene Bott Treating Francina Beery/Extender: Yaakov Guthrie in Treatment: 22 Visit Information History Since Last Visit All ordered tests and consults were completed: No Patient Arrived: Wheel Chair Added or deleted any medications: No Arrival Time: 11:14 Any new allergies or adverse reactions: No Accompanied By: self Had a fall or experienced change in No Transfer Assistance: None activities of daily living that may affect Patient Identification Verified: Yes risk of falls: Secondary Verification Process Completed: Yes Signs or symptoms of abuse/neglect since last visito No Patient Requires Transmission-Based No Hospitalized since last visit: No Precautions: Implantable device outside of the clinic excluding No Patient Has Alerts: Yes cellular tissue based products placed in the center Patient Alerts: Patient on Blood since last visit: Thinner Has Dressing in Place as Prescribed: Yes 81mg  apirin Pain Present Now: No Diabetic Type II Electronic Signature(s) Signed: 08/26/2021 5:07:02 PM By: Carlene Coria RN Entered By: Carlene Coria on 08/25/2021 11:20:21 [name] ([id number]) -------------------------------------------------------------------------------- Clinic Level of Care Assessment Details Patient Name: Heather Dear A. Date of Service: 08/25/2021 11:00 AM Medical Record Number: [id number] Patient Account Number: [id number] Date of Birth/Sex: 09-28-1976 (45 y.o. F) Treating RN: Carlene Coria Primary Care Kie Calvin: Raelene Bott Other Clinician: Referring Alegria Dominique: Raelene Bott Treating  Gailya Tauer/Extender: Yaakov Guthrie in Treatment: 22 Clinic Level of Care Assessment Items TOOL 1 Quantity Score []  - Use when EandM and Procedure is performed on INITIAL visit 0 ASSESSMENTS - Nursing Assessment / Reassessment []  - General Physical Exam (combine w/ comprehensive assessment (listed just below) when performed on new 0 pt. evals) []  - 0 Comprehensive Assessment (HX, ROS, Risk Assessments, Wounds Hx, etc.) ASSESSMENTS - Wound and Skin Assessment / Reassessment []  - Dermatologic / Skin Assessment (not related to wound area) 0 ASSESSMENTS - Ostomy and/or Continence Assessment and Care []  - Incontinence Assessment and Management 0 []  - 0 Ostomy Care Assessment and Management (repouching, etc.) PROCESS - Coordination of Care []  - Simple Patient / Family Education for ongoing care 0 []  - 0 Complex (extensive) Patient / Family Education for ongoing care []  - 0 Staff obtains Programmer, systems, Records, Test Results / Process Orders []  - 0 Staff telephones HHA, Nursing Homes / Clarify orders / etc []  - 0 Routine Transfer to another Facility (non-emergent condition) []  - 0 Routine Hospital Admission (non-emergent condition) []  - 0 New Admissions / Biomedical engineer / Ordering NPWT, Apligraf, etc. []  - 0 Emergency Hospital Admission (emergent condition) PROCESS - Special Needs []  - Pediatric / Minor Patient Management 0 []  - 0 Isolation Patient Management []  - 0 Hearing / Language / Visual special needs []  - 0 Assessment of Community assistance (transportation, D/C planning, etc.) []  - 0 Additional assistance / Altered mentation []  - 0 Support Surface(s) Assessment (bed, cushion, seat, etc.) INTERVENTIONS - Miscellaneous []  - External ear exam 0 []  - 0 Patient Transfer (multiple staff / Civil Service fast streamer / Similar devices) []  - 0 Simple Staple / Suture removal (25 or less) []  - 0 Complex Staple / Suture removal (26 or more) []  - 0 Hypo/Hyperglycemic Management  (do not check if billed separately) []  - 0 Ankle / Brachial Index (ABI) - do not check if billed separately Has the patient been seen at  the hospital within the last three years: Yes Total Score: 0 Level Of Care: ____ [name] ([id number]) Electronic Signature(s) Signed: 08/26/2021 5:07:02 PM By: Carlene Coria RN Entered By: Carlene Coria on 08/25/2021 11:50:24 [name], [name] Kitchen ([id number]) -------------------------------------------------------------------------------- Lower Extremity Assessment Details Patient Name: Heather Dear A. Date of Service: 08/25/2021 11:00 AM Medical Record Number: [id number] Patient Account Number: [id number] Date of Birth/Sex: June 30, 1976 (45 y.o. F) Treating RN: Carlene Coria Primary Care Sarya Linenberger: Raelene Bott Other Clinician: Referring Londynn Sonoda: Raelene Bott Treating Tion Tse/Extender: Yaakov Guthrie in Treatment: 22 Vascular Assessment Pulses: Dorsalis Pedis Palpable: [Left:Yes] [Right:Yes] Electronic Signature(s) Signed: 08/26/2021 5:07:02 PM By: Carlene Coria RN Entered By: Carlene Coria on 08/25/2021 11:28:43 [name], [name] Kitchen ([id number]) -------------------------------------------------------------------------------- Multi Wound Chart Details Patient Name: Heather Dear A. Date of Service: 08/25/2021 11:00 AM Medical Record Number: [id number] Patient Account Number: [id number] Date of Birth/Sex: 07-17-1976 (45 y.o. 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 Extremity Extremity Comorbid History: Chronic sinus problems/congestion, Chronic sinus problems/congestion, Chronic sinus problems/congestion, Middle ear problems, Anemia, Middle ear problems, Anemia, Middle ear problems, Anemia, Chronic Obstructive Pulmonary Chronic Obstructive Pulmonary Chronic Obstructive Pulmonary Disease (COPD), Congestive Heart Disease (COPD), Congestive Heart Disease (COPD), Congestive Heart Failure, Type II Diabetes, End Stage Failure, Type II Diabetes, End Stage Failure, Type II Diabetes, End Stage Renal Disease, History of pressure Renal Disease, History of pressure Renal Disease, History of pressure wounds, Neuropathy wounds, Neuropathy wounds, Neuropathy Date Acquired: 08/07/2020 06/30/2021 07/18/2021 Weeks of Treatment: 22 8 5  Wound Status: Open Open Open Measurements L x W x D (cm) 2.8x4.2x0.8 4x1.7x0.2 0.5x0.6x0.2 Area (cm) : 9.236 5.341 0.236 Volume (cm) : 7.389 1.068 0.047 % Reduction in Area: -245.90% 15.00% 28.50% % Reduction in Volume: -453.50% -70.10% 79.70% Classification: Grade 2 Grade 1 Grade 1 Exudate Amount: Medium Medium Medium Exudate Type: Serosanguineous Serosanguineous Serosanguineous Exudate Color: red, brown red, brown red, brown Wound Margin: Thickened N/A Flat and Intact Granulation Amount: Large (67-100%) Medium (34-66%) Medium (34-66%) Granulation Quality: Red, Pink, Hyper-granulation Red, Pink N/A Necrotic Amount: Small (1-33%) Medium (34-66%) Medium (34-66%) Exposed Structures: Fat Layer (Subcutaneous Tissue): Fat Layer (Subcutaneous Tissue): Fat Layer (Subcutaneous Tissue): Yes Yes Yes Fascia: No Fascia: No Fascia: No Tendon: No Tendon: No Tendon: No Muscle: No Muscle: No Muscle: No Joint: No Joint: No Joint: No Bone: No Bone: No Bone: No Epithelialization: None None  Large (67-100%) Debridement: N/A Debridement - Excisional N/A Pre-procedure Verification/Time N/A 11:40 N/A Out Taken: Pain Control: N/A Lidocaine 4% Topical Solution N/A Tissue Debrided: N/A Subcutaneous, Slough N/A Level: N/A Skin/Subcutaneous Tissue N/A Debridement Area (sq cm): N/A 6.8 N/A Instrument: N/A Curette N/A Harshbarger, Branden A. ([id number]) Bleeding: N/A Minimum N/A Hemostasis Achieved: N/A Pressure N/A Procedural Pain: N/A 0 N/A Post Procedural Pain: N/A 0 N/A Debridement Treatment N/A Procedure was tolerated well N/A Response: Post Debridement N/A 4x1.7x0.2 N/A Measurements L x W x D (cm) Post Debridement Volume: N/A 1.068 N/A (cm) Procedures Performed: N/A Debridement N/A Treatment Notes Electronic Signature(s) Signed: 08/25/2021 1:46:27 PM By: Kalman Shan DO Entered By: Kalman Shan on  08/25/2021 12:38:05 [name], [name] ([id number]) -------------------------------------------------------------------------------- [place] Details Patient Name: [name], [name] Date of Service: 08/25/2021 11:00 AM Medical Record Number: [id number] Patient Account Number: [id number] Date of Birth/Sex: Oct 15, 1976 (45 y.o. F) Treating RN: Carlene Coria Primary Care Ehsan Corvin: Raelene Bott Other Clinician: Referring Doyl Bitting: Raelene Bott Treating Aylyn Wenzler/Extender: Yaakov Guthrie in Treatment: 22 Active Inactive Electronic Signature(s) Signed: 08/26/2021 5:07:02 PM By: Carlene Coria RN Entered By: Carlene Coria on 08/25/2021 11:36:00 Gonsoulin, Lashonda [name] Kitchen ([id number]) -------------------------------------------------------------------------------- Pain Assessment Details Patient Name: Heather Dear A. Date of Service: 08/25/2021 11:00 AM Medical Record Number: [id number] Patient Account Number: [id number] Date of Birth/Sex: 11-29-1975 (45 y.o. F) Treating RN: Carlene Coria Primary Care Teghan Philbin: Raelene Bott Other Clinician: Referring Demarie Hyneman:  Raelene Bott Treating Alailah Safley/Extender: Yaakov Guthrie in Treatment: 22 Active Problems Location of Pain Severity and Description of Pain Patient Has Paino No Site Locations Pain Management and Medication Current Pain Management: Electronic Signature(s) Signed: 08/26/2021 5:07:02 PM By: Carlene Coria RN Entered By: Carlene Coria on 08/25/2021 11:21:05 [name] ([id number]) -------------------------------------------------------------------------------- Patient/Caregiver Education Details Patient Name: Heather Dear A. Date of Service: 08/25/2021 11:00 AM Medical Record Number: [id number] Patient Account Number: [id number] Date of Birth/Gender: [birth year] (45 y.o. F) Treating RN: Carlene Coria Primary Care Physician: Raelene Bott Other Clinician: Referring Physician: Raelene Bott Treating Physician/Extender: Yaakov Guthrie in Treatment: 22 Education Assessment Education Provided To: Patient Education Topics Provided Wound/Skin Impairment: Methods: Explain/Verbal Responses: State content correctly Electronic Signature(s) Signed: 08/26/2021 5:07:02 PM By: Carlene Coria RN Entered By: Carlene Coria on 08/25/2021 11:50:48 [name], [name] ([id number]) -------------------------------------------------------------------------------- Wound Assessment Details Patient Name: Heather Dear A. Date of Service: 08/25/2021 11:00 AM Medical Record Number: [id number] Patient Account Number: [id number] Date of Birth/Sex: 1975-11-04 (45 y.o. F) Treating RN: Carlene Coria Primary Care Dilraj Killgore: Raelene Bott Other Clinician: Referring Jamarrion Budai: Raelene Bott Treating Lennyn Bellanca/Extender: Yaakov Guthrie in Treatment: 22 Wound Status Wound Number: 7 Primary Diabetic Wound/Ulcer of the Lower Extremity Etiology: Wound Location: Left, Midline, Plantar Foot Wound Open Wounding Event: Gradually Appeared Status: Date Acquired: 08/07/2020 Comorbid Chronic sinus  problems/congestion, Middle ear problems, Weeks Of Treatment: 22 History: Anemia, Chronic Obstructive Pulmonary Disease (COPD), Clustered Wound: No Congestive Heart Failure, Type II Diabetes, End Stage Renal Disease, History of pressure wounds, Neuropathy Photos Wound Measurements Length: (cm) 2.8 Width: (cm) 4.2 Depth: (cm) 0.8 Area: (cm) 9.236 Volume: (cm) 7.389 % Reduction in Area: -245.9% % Reduction in Volume: -453.5% Epithelialization: None Tunneling: No Undermining: No Wound Description Classification: Grade 2 Wound Margin: Thickened Exudate Amount: Medium Exudate Type: Serosanguineous Exudate Color: red, brown Foul Odor After Cleansing: No Slough/Fibrino Yes Wound Bed Granulation Amount: Large (67-100%) Exposed Structure Granulation Quality: Red, Pink, Hyper-granulation Fascia Exposed: No Necrotic Amount: Small (1-33%) Fat Layer (Subcutaneous Tissue) Exposed: Yes Necrotic Quality: Adherent Slough Tendon Exposed: No Muscle Exposed: No Joint Exposed: No Bone Exposed: No Electronic Signature(s) Signed: 08/26/2021 5:07:02 PM By: Carlene Coria RN Entered By: Carlene Coria on 08/25/2021 11:26:39 [name], [name] ([id number]) -------------------------------------------------------------------------------- Wound Assessment Details Patient Name: Heather Dear A. Date of Service: 08/25/2021 11:00 AM Medical Record Number: [id number] Patient Account Number: [id number] Date of Birth/Sex: 1976/05/22 (45 y.o. F) Treating RN: Carlene Coria Primary Care Rollan Roger: Raelene Bott Other Clinician: Referring Jacorie Ernsberger: Raelene Bott Treating Albert Devaul/Extender: Yaakov Guthrie in Treatment: 22 Wound Status Wound Number: 8 Primary Diabetic Wound/Ulcer of the Lower Extremity Etiology: Wound Location: Right, Anterior Lower Leg Wound Open Wounding Event: Gradually Appeared Status: Date Acquired: 06/30/2021 Comorbid Chronic sinus problems/congestion, Middle  ear  problems, Weeks Of Treatment: 8 History: Anemia, Chronic Obstructive Pulmonary Disease (COPD), Clustered Wound: No Congestive Heart Failure, Type II Diabetes, End Stage Renal Disease, History of pressure wounds, Neuropathy Photos Wound Measurements Length: (cm) 4 Width: (cm) 1.7 Depth: (cm) 0.2 Area: (cm) 5.341 Volume: (cm) 1.068 % Reduction in Area: 15% % Reduction in Volume: -70.1% Epithelialization: None Tunneling: No Undermining: No Wound Description Classification: Grade 1 Exudate Amount: Medium Exudate Type: Serosanguineous Exudate Color: red, brown Foul Odor After Cleansing: No Slough/Fibrino No Wound Bed Granulation Amount: Medium (34-66%) Exposed Structure Granulation Quality: Red, Pink Fascia Exposed: No Necrotic Amount: Medium (34-66%) Fat Layer (Subcutaneous Tissue) Exposed: Yes Necrotic Quality: Adherent Slough Tendon Exposed: No Muscle Exposed: No Joint Exposed: No Bone Exposed: No Electronic Signature(s) Signed: 08/26/2021 5:07:02 PM By: Carlene Coria RN Entered By: Carlene Coria on 08/25/2021 11:27:26 [name], [name] ([id number]) -------------------------------------------------------------------------------- Wound Assessment Details Patient Name: Heather Dear A. Date of Service: 08/25/2021 11:00 AM Medical Record Number: [id number] Patient Account Number: [id number] Date of Birth/Sex: 08/11/1976 (45 y.o. F) Treating RN: Carlene Coria Primary Care Mozes Sagar: Raelene Bott Other Clinician: Referring Mylei Brackeen: Raelene Bott Treating Laurice Iglesia/Extender: Yaakov Guthrie in Treatment: 22 Wound Status Wound Number: 9 Primary Diabetic Wound/Ulcer of the Lower Extremity Etiology: Wound Location: Right Calcaneus Wound Open Wounding Event: Gradually Appeared Status: Date Acquired: 07/18/2021 Comorbid Chronic sinus problems/congestion, Middle ear problems, Weeks Of Treatment: 5 History: Anemia, Chronic Obstructive Pulmonary Disease  (COPD), Clustered Wound: No Congestive Heart Failure, Type II Diabetes, End Stage Renal Disease, History of pressure wounds, Neuropathy Photos Wound Measurements Length: (cm) 0.5 Width: (cm) 0.6 Depth: (cm) 0.2 Area: (cm) 0.236 Volume: (cm) 0.047 % Reduction in Area: 28.5% % Reduction in Volume: 79.7% Epithelialization: Large (67-100%) Tunneling: No Undermining: No Wound Description Classification: Grade 1 Wound Margin: Flat and Intact Exudate Amount: Medium Exudate Type: Serosanguineous Exudate Color: red, brown Foul Odor After Cleansing: No Slough/Fibrino Yes Wound Bed Granulation Amount: Medium (34-66%) Exposed Structure Necrotic Amount: Medium (34-66%) Fascia Exposed: No Necrotic Quality: Adherent Slough Fat Layer (Subcutaneous Tissue) Exposed: Yes Tendon Exposed: No Muscle Exposed: No Joint Exposed: No Bone Exposed: No Electronic Signature(s) Signed: 08/26/2021 5:07:02 PM By: Carlene Coria RN Entered By: Carlene Coria on 08/25/2021 11:40:02 [name], [name] ([id number]) -------------------------------------------------------------------------------- Vitals Details Patient Name: Heather Dear A. Date of Service: 08/25/2021 11:00 AM Medical Record Number: [id number] Patient Account Number: [id number] Date of Birth/Sex: 01/09/1976 (45 y.o. F) Treating RN: Carlene Coria Primary Care Alisea Matte: Raelene Bott Other Clinician: Referring Malaquias Lenker: Raelene Bott Treating Adeli Frost/Extender: Yaakov Guthrie in Treatment: 22 Vital Signs Time Taken: 11:20 Temperature (F): 98.2 Height (in): 69 Pulse (bpm): 117 Weight (lbs): 185 Respiratory Rate (breaths/min): 20 Body Mass Index (BMI): 27.3 Blood Pressure (mmHg): 105/62 Reference Range: 80 - 120 mg / dl Electronic Signature(s) Signed: 08/26/2021 5:07:02 PM By: Carlene Coria RN Entered By: Carlene Coria on 08/25/2021 11:20:54

## 2021-09-01 ENCOUNTER — Encounter (HOSPITAL_BASED_OUTPATIENT_CLINIC_OR_DEPARTMENT_OTHER): Payer: Medicaid Other | Admitting: Internal Medicine

## 2021-09-01 ENCOUNTER — Other Ambulatory Visit: Payer: Self-pay

## 2021-09-01 DIAGNOSIS — E11621 Type 2 diabetes mellitus with foot ulcer: Secondary | ICD-10-CM | POA: Diagnosis not present

## 2021-09-01 DIAGNOSIS — L97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ity: Secondary | ICD-10-CM

## 2021-09-02 NOTE — Progress Notes (Signed)
[name], [name] ([id number]) Visit Report for 09/01/2021 Chief Complaint Document Details Patient Name: SHEFALI, NG A. Date of Service: 09/01/2021 10:45 AM Medical Record Number: [id number] Patient Account Number: [id number] Date of Birth/Sex: 25-May-1976 (45 y.o. F) Treating RN: Cornell Barman Primary Care Provider: Raelene Bott Other Clinician: Referring Provider: Raelene Bott Treating Provider/Extender: Yaakov Guthrie in Treatment: 23 Information Obtained from: Patient Chief Complaint 03/19/2021; patient referred by Dr. Luana Shu who has been looking after her left foot for quite a period of time for review of a nonhealing area in the left midfoot Electronic Signature(s) Signed: 09/01/2021 12:57:11 PM By: Kalman Shan DO Entered By: Kalman Shan on 09/01/2021 12:45:00 [name] ([id number]) -------------------------------------------------------------------------------- Debridement Details Patient Name: Heather Dear A. Date of Service: 09/01/2021 10:45 AM Medical Record Number: [id number] Patient Account Number: [id number] Date of Birth/Sex: February 21, 1976 (45 y.o. F) Treating RN: Cornell Barman Primary Care Provider: Raelene Bott Other Clinician: Referring Provider: Raelene Bott Treating Provider/Extender: Yaakov Guthrie in Treatment: 23 Debridement Performed for Wound #7 Left,Midline,Plantar Foot Assessment: Performed By: Physician Kalman Shan, MD Debridement Type: Debridement Severity of Tissue Pre Debridement: Fat layer exposed Level of Consciousness (Pre- Awake and Alert procedure): Pre-procedure Verification/Time Out Yes - 11:57 Taken: Total Area Debrided (L x W): 6 (cm) x 3.5 (cm) = 21 (cm) Tissue and other material Viable, Non-Viable, Eschar, Slough, Subcutaneous, Skin: Dermis , Skin: Epidermis, Slough debrided: Level: Skin/Subcutaneous Tissue Debridement Description: Excisional Instrument: Curette Bleeding: Minimum Hemostasis  Achieved: Pressure Response to Treatment: Procedure was tolerated well Level of Consciousness (Post- Awake and Alert procedure): Post Debridement Measurements of Total Wound Length: (cm) 6 Width: (cm) 3.5 Depth: (cm) 0.9 Volume: (cm) 14.844 Character of Wound/Ulcer Post Debridement: Stable Severity of Tissue Post Debridement: Fat layer exposed Post Procedure Diagnosis Same as Pre-procedure Electronic Signature(s) Signed: 09/01/2021 12:57:11 PM By: Kalman Shan DO Signed: 09/01/2021 5:42:47 PM By: Gretta Cool, BSN, RN, CWS, [name] RN, BSN Entered By: Gretta Cool, BSN, RN, CWS, [name] on 09/01/2021 11:58:20 [name], [name] ([id number]) -------------------------------------------------------------------------------- HPI Details Patient Name: Heather Dear A. Date of Service: 09/01/2021 10:45 AM Medical Record Number: [id number] Patient Account Number: [id number] Date of Birth/Sex: 1976-03-08 (45 y.o. F) Treating RN: Cornell Barman Primary Care Provider: Raelene Bott Other Clinician: Referring Provider: Raelene Bott Treating Provider/Extender: Yaakov Guthrie in Treatment: 23 History of Present Illness HPI Description: 01/18/18-She is here for initial evaluation of the left great toe ulcer. She is a poor historian in regards to timeframe in detail. She states approximately 4 weeks ago she lacerated her toe on something in the house. She followed up with her primary care who placed her on Bactrim and ultimately a second dose of Bactrim prior to coming to wound clinic. She states she has been treating the toe with peroxide, Betadine and a Band-Aid. She did not check her blood sugar this morning but checked it yesterday morning it was 327; she is unaware of a recent A1c and there are no current records. She saw Dr. she would've orthopedics last week for an old injury to the left ankle, she states he did not see her toe, nor did she bring it to his attention. She smokes approximately 1 pack  cigarettes a day. Her social situation is concerning, she arrives this morning with her mother who appears extremely intoxicated/under the influence; her mother was asked to leave the room and be monitored by the patient's grandmother. The patient's aunt then accompanied the patient and the room throughout the rest of the appointment. We had  a lengthy discussion regarding the deleterious effects of uncontrolled hyperglycemia and smoking as it relates to wound healing and overall health. She was strongly encouraged to decrease her smoking and get her diabetes under better control. She states she is currently on a diet and has cut down her [facility] consumption. The left toe is erythematous, macerated and slightly edematous with malodor present. The edema in her left foot is below her baseline, there is no erythema streaking. We will treat her with Santyl, doxycycline; we have ordered and xray, culture and provided a Peg assist surgical shoe and cultured the wound. 01/25/18-She is here in follow-up evaluation for a left great toe ulcer and presents with an abscess to her suprapubic area. She states her blood sugars remain elevated, feeling "sick" and if levels are below 250, but she is trying. She has made no attempt to decrease her smoking stating that we "can't take away her food in her cigarettes". She has been compliant with offloading using the PEG assist you. She is using Santyl daily. the culture obtained last week grew staph aureus and Enterococcus faecalis; continues on the doxycycline and Augmentin was added on Monday. The suprapubic area has erythema, no femoral variation, purple discoloration, minimal induration, was accessed with a cotton tip applicator with sanguinopurulent drainage, this was cultured, I suspect the current antibiotic treatment will cover and we will not add anything to her current treatment plan. She was advised to go to urgent care or ER with any change in redness,  induration or fever. 02/01/18-She is here in follow-up evaluation for left great toe ulcers and a new abdominal abscess from last week. She was able to use packing until earlier this week, where she "forgot it was there". She states she was feeling ill with GI symptoms last week and was not taking her antibiotic. She states her glucose levels have been predominantly less than 200, with occasional levels between 200-250. She thinks this was contributing to her GI symptoms as they have resolved without intervention. There continues to be significant laceration to left toe, otherwise it clinically looks stable/improved. There is now less superficial opening to the lateral aspect of the great toe that was residual blister. We will transition to [facility] to all wounds, she will continue her Augmentin. If there is no change or deterioration next week for reculture. 02/08/18-She is here in follow-up evaluation for left great toe ulcer and abdominal ulcer. There is an improvement in both wounds. She has been wrapping her left toe with coban, not by our direction, which has created an area of discoloration to the medial aspect; she has been advised to NOT use coban secondary to her neuropathy. She states her glucose levels have been high over this last week ranging from 200-350, she continues to smoke. She admits to being less compliant with her offloading shoe. We will continue with same treatment plan and she will follow-up next week. 02/15/18-She is here in follow-up evaluation for left great toe ulcer and abdominal ulcer. The abdominal ulcer is epithelialized. The left great toe ulcer is improved and all injury from last week using the Coban wrap is resolved, the lateral ulcer is healed. She admits to noncompliance with wearing offloading shoe and admits to glucose levels being greater than 300 most of the week. She continues to smoke and expresses no desire to quit. There is one area medially that  probes deeper than it has historically, erythema to the toe and dorsal foot has consistently waxed and  waned. There is no overt signs of cellulitis or infection but we will culture the wound for any occult infection given the new area of depth and erythema. We will hold off on sensitivities for initiation of antibiotic therapy. 02/22/18-She is here in follow up evaluation for left great toe ulcer. There is overall significant improvement in both wound appearance, erythema and edema with changes made last week. She was not initiated on antibiotic therapy. Culture obtained last week showed oxacillin sensitive staph aureus, sensitive to clindamycin. Clindamycin has been called into the pharmacy but she has been instructed to hold off on initiation secondary to overall clinical improvement and her history of antibiotic intolerance. She has been instructed to contact the clinic with any noted changes/deterioration and the wound, erythema, edema and/or pain. She will follow-up next week. She continues to smoke and her glucose levels remain elevated >250; she admits to compliance with offloading shoe 03/01/18 on evaluation today patient appears to be doing fairly well in regard to her left first toe ulcer. She has been tolerating the dressing changes with the [facility] Dressing without complication and overall this has definitely showed signs of improvement according to records as well is what the patient tells me today. I'm very pleased in that regard. She is having no pain today 03/08/18 She is here for follow up evaluation of a left great toe ulcer. She remains non-compliant with glucose control and smoking cessation; glucose levels consistently >200. She states that she got new shoe inserts/peg assist. She admits to compliance with offloading. Since my last evaluation there is significant improvement. We will switch to prisma at this time and she will follow up next week. She is noted to be  tachycardic at this appointment, heart rate 120s; she has a history of heart rate 70-130 according to our records. She admits to extreme agitation r/t personal issues; she was advised to monitor her heartrate and contact her physician if it does not return to a more normal range (<100). She takes cardizem twice daily. 03/15/18-She is here in follow-up evaluation for left great toe ulcer. She remains noncompliant with glucose control and smoking cessation. She admits to compliance with wearing offloading shoe. The ulcer is improved/stable and we will continue with the same treatment plan and she will follow-up next week 03/22/18-She is here for evaluation for left great toe ulcer. There continues to be significant improvement despite recurrent hyperglycemia (over 500 yesterday) and she continues to smoke. She has been compliant with offloading and we will continue with same treatment plan and she will follow-up next week. 03/29/18-She is here for evaluation for left great toe ulcer. Despite continuing to smoke and uncontrolled diabetes she continues to improve. She is compliant with offloading shoe. We will continue with the same treatment plan and she will follow-up next week 04/05/18- She is here in follow up evaluation for a left great toe ulcer; she presents with small pustule to left fifth toe (resembles ant bite). She admits to compliance with wearing offloading shoe; continues to smoke or have uncontrolled blood glucose control. There is more callus than usual with evidence of bleeding; she denies known trauma. 04/12/18-She is here for evaluation of left great toe ulcer. Despite noncompliance with glycemic control and smoking she continues to make [place], Evlyn A. ([id number]) improvement. She continues to wear offloading shoe. The pustule, that was identified last week, to the left fifth toe is resolved. She will follow-up in 2 weeks 05/03/18-she is seen in follow-up evaluation for a  left great  toe ulcer. She is compliant with offloading, otherwise noncompliant with glycemic control and smoking. She has plateaued and there is minimal improvement noted. We will transition to [facility], replaced the insert to her surgical shoe and she will follow-up in one week 05/10/18- She is here in follow up evaluation for a left great toe ulcer. It appears stable despite measurement change. We will continue with same treatment plan and follow up next week. 05/24/18-She is seen in follow-up evaluation for a left great toe ulcer. She remains compliant with offloading, has made significant improvement in her diet, decreasing the amount of sugar/soda. She said her recent A1c was 10.9 which is lower than. She did see a diabetic nutritionist/educator yesterday. She continues to smoke. We will continue with the same treatment plan and she'll follow-up next week. 05/31/18- She is seen in follow-up evaluation for left great toe ulcer. She continues to remain compliant with offloading, continues to make improvement in her diet, increasing her water and decreasing the amount of sugar/soda. She does continue to smoke with no desire to quit. We will apply Prisma to the depth and Hydrofera Blue over. We have not received insurance authorization for oasis. She will follow up next week. 06/07/18-She is seen in follow-up evaluation for left great toe ulcer. It has stalled according to today's measurements although base appears stable. She says she saw a diabetic educator yesterday; her average blood sugars are less than 300 which is an improvement for her. She continues to smoke and states "that's my next step" She continues with water over soda. We will order for xray, culture and reinstate ace wrap compression prior to placing apligraf for next week. She is voicing no complaints or concerns. Her dressing will change to iodoflex over the next week in preparation for apligraf. 06/14/18-She is seen in follow-up evaluation  for left great toe ulcer. Plain film x-ray performed last week was negative for osteomyelitis. Wound culture obtained last week grew strep B and OSSA; she is initiated on keflex and cefdinir today; there is erythema to the toe which could be from ace wrap compression, she has a history of wrapping too tight and has has been encouraged to maintain ace wraps that we place today. We will hold off on application of apligraf today, will apply next week after antibiotic therapy has been initiated. She admits today that she has resumed taking a shower with her foot/toe submerged in water, she has been reminded to keep foot/toe out of the bath water. She will be seen in follow up next week 06/21/18-she is seen in follow-up evaluation for left great toe ulcer. She is tolerating antibiotic therapy with no GI disturbance. The wound is stable. Apligraf was applied today. She has been decreasing her smoking, only had 4 cigarettes yesterday and 1 today. She continues being more compliant in diabetic diet. She will follow-up next week for evaluation of site, if stable will remove at 2 weeks. 06/28/18- She is here in follow up evalution. Apligraf was placed last week, she states the dressing fell off on Tuesday and she was dressing with hydrofera blue. She is healed and will be discharged from the clinic today. She has been instructed to continue with smoking cessation, continue monitoring glucose levels, offloading for an additional 4 weeks and continue with hydrofera blue for additional two weeks for any possible microscopic opening. Readmission: 08/07/18 on evaluation today patient presents for reevaluation concerning the ulcer of her right great toe. She was previously discharged  on 06/28/18 healed. Nonetheless she states that this began to show signs of drainage she subsequently went to her primary care provider. Subsequently an x-ray was performed on 08/01/18 which was negative. The patient was also placed on  antibiotics at that time. Fortunately they should have been effective for the infection. Nonetheless she's been experiencing some improvement but still has a lot of drainage coming from the wound itself. 08/14/18 on evaluation today patient's wound actually does show signs of improvement in regard to the erythema at this point. She has completed the antibiotics. With that being said we did discuss the possibility of placing her in a total contact cast as of today although I think that I may want to give this just a little bit more time to ensure nothing recurrence as far as her infection is concerned. I do not want to put in the cast and risk infection at that time if things are not completely resolved. With that being said she is gonna require some debridement today. 08/21/18 on evaluation today patient actually appears to be doing okay in regard to her toe ulcer. She's been tolerating the dressing changes without complication. With that being said it does appear that she is ready and in fact I think it's appropriate for [place] to go ahead and initiate the total contact cast today. Nonetheless she will require some sharp debridement to prepare the wound for application. Overall I feel like things have been progressing well but we do need to do something to get this to close more readily. 08/24/18 patient seen today for reevaluation after having had the total contact cast applied on Tuesday. She seems to have done very well the wound appears to be doing great and overall I'm pleased with the progress that she's made. There were no abnormal areas of rubbing from the cast on her lower extremity. 08/30/18 on evaluation today patient actually appears to be completely healed in regard to her plantar toe ulcer. She tells me at this point she's been having a lot of issues with the cast. She almost fell a couple of times the state shall the step of her dog a couple times as well. This is been a very frustrating  process for her other nonetheless she has completely healed the wound which is excellent news. Overall there does not appear to be the evidence of infection at this time which is great news. 09/11/18 evaluation today patient presents for follow-up concerning her great toe ulcer on the left which has unfortunately reopened since I last saw her which was only a couple of weeks ago. Unfortunately she was not able to get in to get the shoe and potentially the AFO that's gonna be necessary due to her left foot drop. She continues with offloading shoe but this is not enough to prevent her from reopening it appears. When we last had her in the total contact cast she did well from a healing standpoint but unfortunately the wound reopened as soon as she came out of the cast within just a couple of weeks. 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 This is also part of the reason why she falls on a regular basis. I just do not believe that is gonna be safe for [place] to reinitiate the  total contact cast as last time we had this on she fell 3 times one week which is definitely not normal for her. 09/18/18 upon evaluation today the patient actually appears to be doing about the same in regard to her toe ulcer. She did not contact [employer] as I asked her to even though I had given her the prescription. In fact she actually states that she has no idea where the prescription is. She did apparently call [employer] and they told her that all she needed to do was bring the prescription in order to be able to be seen and work on getting the AFO for her left foot. With all that being said she still does not have an appointment and I'm not sure were things stand that regard. I will give her a new prescription today in order to contact  them to get this set up. 09/25/18 on evaluation today patient actually appears to be doing about the same in regard to her toes ulcer. She does have a small areas which seems to have a lot of callous buildup around the edge of the wound which is going to need sharp debridement today. She still is waiting to be scheduled for evaluation with [employer] for possibility of an AFO. She states there supposed to call her tomorrow to get this set up. Unfortunately it does appear that her foot specifically the toe area is showing signs of erythema. There does not appear to be any systemic infection which is in these good news. [name], [name] ([id number]) 10/02/18 on evaluation today patient actually appears to be doing about the same in regard to her toe ulcer. This really has not done too well although it's not significantly larger it's also not significantly smaller. She has been tolerating the dressing changes without complication. She actually has her appointment with [employer] and [place] tomorrow to hopefully be measured for obtaining and AFO splint. I think this would be helpful preventing this from reoccurring. We had contemplated starting the cast this week although to be honest I am reluctant to do that as she's been having nausea, vomiting, and seizure activity over the past three days. She has a history of seizures and have been told is nothing that can be done for these. With that being said I do believe that along with the seizures have the nausea vomiting which upon further questioning doesn't seem to be the normal for her and makes me concerned for the possibility of infection or something else going on. I discussed this with the patient and her mother during the office visit today. I do not feel the wound is effective but maybe something else. The responses this was "this just happens to her at times and we don't know why". They did not seem to be interested in going to the hospital to have this  checked out further. 10/09/18 on evaluation today patient presents for follow-up concerning her ongoing toe ulcer. She has been tolerating the dressing changes without complication. Fortunately there does not appear to be any evidence of infection which is great news however I do think that the patient would benefit from going ahead for with the total contact cast. She's actually in a wheelchair today she tells me that she will use her walker if we initiate the cast. I was very specific about the fact that if we were gonna do the cast I wanted to make sure that she was using the walker in order to prevent any falls. She tells  me she does not have stairs that she has to traverse on a regular basis at her home. She has not had any seizures since last week again that something that happens to her often she tells me she did talk to [employer] and they said that it may take up to three weeks to get the brace approved for her. Hopefully that will not take that long but nonetheless in the meantime I do think the cast could be of benefit. 10/12/18 on evaluation today patient appears to be doing rather well in regard to her toe ulcer. It's just been a few days and already this is significantly improved both as far as overall appearance and size. Fortunately there's no sign of infection. She is here for her first obligatory cast change. 10/19/18 Seen today for follow up and management of left great toe ulcer. Wound continues to show improvement. Noted small open area with seroussang drainage with palpation. Denies any increased pain or recent fevers during visit. She will continue calcium alginate with offloading shoe. Denies any questions or concerns during visit. 10/26/18 on evaluation today patient appears to be doing about the same as when I last saw her in regard to her wound bed. Fortunately there does not appear to be any signs of infection. Unfortunately she continues to have a breakdown in regard to the  toe region any time that she is not in the cast. It takes almost no time at all for this to happen. Nonetheless she still has not heard anything from the brace being made by [employer] as to when exactly this will be available to her. Fortunately there is no signs of infection at this time. 10/30/18 on evaluation today patient presents for application of the total contact cast as we just received him this morning. Fortunately we are gonna be able to apply this to her today which is great news. She continues to have no significant pain which is good news. Overall I do feel like things have been improving while she was the cast is when she doesn't have a cast that things get worse. She still has not really heard anything from [place]elope regarding her brace. 11/02/18 upon evaluation today patient's wound already appears to be doing significantly better which is good news. Fortunately there does not appear to be any signs of infection also good news. Overall I do think the total contact cast as before is helping to heal this area unfortunately it's just not gonna likely keep the area closed and healed without her getting her brace at least. Again the foot drop is a significant issue for her. 11/09/18 on evaluation today patient appears to be doing excellent in regard to her toe ulcer which in fact is completely healed. Fortunately we finally got the situation squared away with the paperwork which was needed to proceed with getting her brace approved by Medicaid. I have filled that out unfortunately that information has been sent to the orthopedic office that I worked at 2 1/2 years ago and not tired Current wound care measures. Fortunately she seems to be doing very well at this time. 11/23/18 on evaluation today patient appears to be doing More Poorly Compared to Last Time I Saw Her. At [facility] She Had Completely Healed. Currently she is continuing to have issues with reopening. She states that she just found  out that the brace was approved through Medicaid now she just has to go get measured in order to have this fitted for her and  then made. Subsequently she does not have an appointment for this yet that is going to complicate things we obviously cannot put her back in the cast if we do not have everything measured because they're not gonna be able to measure her foot while she is in the cast. 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 Obviously this is unfortunate and does have me somewhat worried as well. 11/30/18 on evaluation today patient's toe ulcer actually appears to be doing fairly well. The good news is she will be getting her brace in the shoes next week on Wednesday. Hopefully we will be able to get this to heal without having to go back in the cast however she may need the cast in order to get the wound completely heal and then go from there. Fortunately there's no signs of infection at this time. 12/07/18 on evaluation today patient fortunately did receive her brace and she states she could tell this definitely makes her walk better. With that being said she's been having issues with her toe where she noticed yesterday there was a lot of tissue that was loosing off this appears to be much larger than what it was previous. She also states that her leg has been read putting much across the top of her foot just about the ankle although this seems to be receiving somewhat. The total area is still red and appears to be someone infected as best I can tell. She is previously taken Bactrim and that may be a good option for her today as well. We are gonna see what I wound culture shows as well and I think that this is definitely appropriate. With that being said outside of the culture I still need to initiate something in the interim and that's what I'm gonna go ahead and select Bactrim is a good option  for her. 12/14/18 on evaluation today patient appears to be doing better in regard to her left great toe ulcer as compared to last week's evaluation. There's still some erythema although this is significantly improved which is excellent news. Overall I do believe that she is making good progress is still gonna take some time before she is where I would like her to be from the standpoint of being able to place her back into the total contact cast. Hopefully we will be where we need to be by next week. 12/21/18 on evaluation today patient actually appears to be doing poorly in regard to her toe ulcer. She's been tolerating the dressing changes without complication. Fortunately there's no signs of systemic infection although she does have a lot of drainage from the toe ulcer and this does seem to be causing some issues at this point. She does have erythema on the distal portion of her toe that appears to be likely cellulitis. 12/28/18 on evaluation today patient actually appears to be doing a little better in my pinion in regard to her toe ulcer. With that being said she still does have some evidence of infection at this time and for her culture she had both E. coli as well as enterococcus as organisms noted on evaluation. For that reason I think that though the Keflex likely has treated the E. coli rather well this has really done nothing for the enterococcus. We are going to have to initiate treatment for this specifically. [name], [name] ([id number]) 01/04/19 on evaluation  today patient's toe actually appears to be doing better from the standpoint of infection. She currently would like to see about putting the cash back on I think that this is appropriate as long as she takes care of it and keeps it from getting wet. She is gonna have some drainage we can definitely pass this up with Drawtex and alginate to try to prevent as much drainage as possible from causing the problems. With that being said I do  want to at least try her with the cast between now and Tuesday. If there any issues we can't continue to use it then I will discontinue the use of the cast at that point. 01/08/19 on evaluation today patient actually appears to be doing very well as far as her foot ulcer specifically the great toe on the left is concerned. She did have an area of rubbing on the medial aspect of her left ankle which again is from the cast. Fortunately there's no signs of infection at this point in this appears to be a very slight skin breakdown. The patient tells me she felt it rubbing but didn't think it was that bad. Fortunately there is no signs of active infection at this time which is good news. No fevers, chills, nausea, or vomiting noted at this time. 01/15/19 on evaluation today patient actually appears to be doing well in regard to her toe ulcer. Again as previous she seems to do well and she has the cast on which indicates to me that during the time she doesn't have a cast on she's putting way too much pressure on this region. Obviously I think that's gonna be an issue as with the current national emergency concerning the Covid-19 Virus it has been recommended that we discontinue the use of total contact casting by the chief medical officer of our company, Dr. Simona Huh. The reasoning is that if a patient becomes sick and cannot come into have the cast removed they could not just leave this on for an additional two weeks. Obviously the hospitals also do not want to receive patient's who are sick into the emergency department to potentially contaminate the region and spread the Covid-19 Virus among other sick individuals within the hospital system. Therefore at this point we are suspending the use of total contact cast until the current emergency subsides. This was all discussed with the patient today as well. 01/22/19 on evaluation today patient's wound on her left great toe appears to be doing slightly worse than  previously noted last week. She tells me that she has been on this quite a bit in fact she tells me she's been awake for 38 straight hours. This is due to the fact that she's having to care for grandparents because nobody else will. She has been taking care of them for five the last seven days since I've seen her they both have dementia his is from a stroke and her grandmother's was progressive. Nonetheless she states even her mom who knows her condition and situation has only help two of those days to take care of them she's been taking care of the rest. Fortunately there does not appear to be any signs of active infection in regard to her toe at this point although obviously it doesn't look as good as it did previous. I think this is directly related to her not taking off the pressure and friction by way of taking things easy. Though I completely understand what's going on. 01/29/19 on evaluation today  patient's tools are actually appears to be showing some signs of improvement today compared to last week's evaluation as far as not necessarily the overall size of the wound but the fact that she has some new skin growth in between the two ends of the wound opening. Overall I feel like she has done well she states that she had a family member give her what sounds to be a CAM walker boot which has been helpful as well. 02/05/19 on evaluation today patient's wound bed actually appears to be doing significantly better in regard to her overall appearance of the size of the wound. With that being said she is still having an issue with offloading efficiently enough to get this to close. Apparently there is some signs of infection at this point as well unfortunately. Previously she's done well of Augmentin I really do not see anything that needs to be culture currently but there theme and cellulitis of the foot that I'm seeing I'm gonna go ahead and place her on an antibiotic today to try to help clear  this up. 02/12/2019 on evaluation today patient actually appears to be doing poorly in regard to her overall wound status. She tells me she has been using her offloading shoe but actually comes in today wearing her tennis shoe with the AFO brace. Again as I previously discussed with her this is really not sufficient to allow the area to heal appropriately. Nonetheless she continues to be somewhat noncompliant and I do wonder based on what she has told my nurse in the past as to whether or not a good portion of this noncompliance may be recreational drug and alcohol related. She has had a history of heroin overdose and this was fairly recently in the past couple of months that have been seeing her. Nonetheless overall I feel like her wound looks significantly worse today compared to what it was previous. She still has significant erythema despite the Augmentin I am not sure that this is an appropriate medication for her infection I am also concerned that the infection may have gone down into her bone. 02/19/19 on evaluation today patient actually appears to be doing about the same in regard to her toe ulcer. Unfortunately she continues to show signs of bone exposure and infection at this point. There does not appear to be any evidence of worsening of the infection but I'm also not really sure that it's getting significantly better. She is on the Augmentin which should be sufficient for the Staphylococcus aureus infection that she has at this point. With that being said she may need IV antibiotics to more appropriately treat this. We did have a discussion today about hyperbaric option therapy. 02/28/19 on evaluation today patient actually appears to be doing much worse in regard to the wound on her left great toe as compared to even my previous evaluation last week. Unfortunately this seems to be training in a pretty poor direction. Her toe was actually now starting to angle laterally and I can actually  see the entire joint area of the proximal portion of the digit where is the distal portion of the digit again is no longer even in contact with the joint line. Unfortunately there's a lot more necrotic tissue around the edge and the toe appears to be showing signs of becoming gangrenous in my pinion. I'm very concerned about were things stand at this point. She did see infectious disease and they are planning to send in a prescription for Sivextro  for her and apparently this has been approved. With that being said I don't think she should avoid taking this but at the same time I'm not sure that it's gonna be sufficient to save her toe at this point. 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 03/05/19 on evaluation today patient actually appears to be doing quite well in regard to her toe all things considering. She still has bone exposed but there appears to be much less your thing on overall the appearance of the wound and the toe itself is dramatically improved. She still does have some issues currently obviously with infection she did see vascular as well and there concerned that her blood flow to the toad. For that reason they are setting up for an angiogram next week. 03/14/19 on evaluation today patient appears to be doing very poor in regard to her toe and specifically in regard to the ulceration and the fact that she's starting to notice the toe was leaning even more towards the lateral aspect and the complete joint is visible on the proximal aspect of the joint. Nonetheless she's also noted a significant odor and the tip of the toe is turning more dark and necrotic appearing. Overall I think she is getting worse not better as far as this is concerned. For that reason I am recommending at this  point that she likely needs to be seen for likely amputation. READMISSION 03/19/2021 This is a patient that we cared for in this clinic for a prolonged period of time in 2019 and 2020 with a left foot and left first toe wound. 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 ([id number]) 04/09/20 by Dr. Luana Shu. In December 2021 she had an ulcer on her right great toe as well as the fourth and fifth toes. 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 She also had an angiogram at that time and underwent angioplasty of the right anterior tibial artery. In any case she claims that the wound on the right foot is closed I did not look at this today which was probably an oversight although I think that should be done next week. After her surgery she developed a dehiscence but I do not see any follow-up of this. According to Dr. Deborra Medina last review that she was out of the area being cared for by another physician but recently came back to his attention. 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 A culture of this area showed E. coli apparently before she came back to see Dr. Luana Shu she was supposed to be receiving antibiotics but she did not really take them. Nor is she offloading this area at all. Finally her last hemoglobin A1c listed in epic was in March 2022 at 14.1 she says things are a lot better since then although I am not sure. 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 She had this wound at the time. During this admission she had E. coli sepsis a TEE was negative. Past medical history is extensive and includes type 2 diabetes with peripheral neuropathy cardiomyopathy  with an ejection fraction of 33%, hypertension, hyperlipidemia chronic renal failure stage  III history of substance abuse with cocaine although she claims to be clean now verified by her mother. She is still a heavy cigarette smoker. She has a history of bipolar disorder seizure disorder ABI in our clinic was 1.05 6/1; left midfoot in the setting of a TMA done previously. Round circular wound with a "knuckle" of protruding tissue. The problem is that the knuckle was not attached to any of the surrounding granulation and this probed proximally widely I removed a large portion of this tissue. This wound goes with considerable undermining laterally. I do not feel any bone there was no purulence but this is a deep wound. 6/8; in spite of the debridement I did last week. She arrives with a wound looking exactly the same. A protruding "knuckle" of tissue nonadherent to most of the surrounding tissue. There is considerable depth around this from 6-12 o'clock at 2.7 cm and undermining of 1 cm. This does not look overtly infected and the x-ray I did last week was negative for any osseous abnormalities. We have been using silver collagen 6/15; deep tissue culture I did last week showed moderate staph aureus and moderate Pseudomonas. This will definitely require prolonged antibiotic therapy. The pathology on the protuberant area was negative for malignancy fungus etc. 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 We have been using silver collagen. I am going to be prescribing Levaquin for 2 weeks. Her CT scan of the foot is down for 7/5 6/22; CT scan of the foot on 7 5. She says she has hardware in the left leg from her previous fracture. She is on the Levaquin for the deep tissue culture I did that showed methicillin sensitive staph aureus and Pseudomonas. I gave her a 2-week supply and she will have another week. She arrives in clinic today with the same protuberant tissue however this is nonadherent to the tissue surrounding it. I am really  at a loss to explain this unless there is underlying deep tissue infection 6/29; patient presents for 1 week follow-up. She has been using collagen to the wound bed. She reports taking her antibiotics as prescribed.She has no complaints or issues today. She denies signs of infection. 7/6; patient presents for one week followup. She has been using collagen to the wound bed. She states she is taking Levaquin however at times she is not able to keep it down. She denies signs of infection. 7/13; patient presents for 1 week follow-up. She has been using silver alginate to the wound bed. She still has nausea when taking her antibiotics. She denies signs of infection. 7/20; patient presents for 1 week follow-up. She has been using silver alginate with gentamicin cream to the wound bed. She denies any issues and has no complaints today. She denies signs of infection. 7/27; patient presents for 1 week follow-up. She continues to use silver alginate with gentamicin cream to the wound bed. She reports starting her antibiotics. She has no issues or complaints. Overall she reports stability to the wound. 8/3; patient presents for 1 week follow-up. She has been using silver alginate with gentamicin cream to the wound bed. She reports completing all antibiotics. She has no issues or complaints today. She denies signs of infection. 8/17; patient presents for 2-week follow-up. He is to use silver alginate to the wound bed. She has no issues or complaints today. She denies signs of infection.  She reports her pain has improved in her foot since last clinic visit 8/24; patient presents for 1 week follow-up. She continues to use silver alginate to the wound bed. She has no issues or complaints. She denies signs of infection. Pain is stable. 9/7; patient presents for follow-up. She missed her last week appointment due to feeling ill. She continues to use silver alginate. She has a new wound to the right lower extremity  that is covered in eschar. She states It occurred over the past week and has no idea how it started. She currently denies signs of infection. 9/14; patient presents for follow-up. To the left foot wound she has been using gentamicin cream and silver alginate. To the right lower extremity wound she has been keeping this covered and has not obtain Santyl. 9/21; patient presents for follow-up. She reports using gentamicin cream and silver alginate to the left foot and Santyl to the right lower extremity wound. She has no issues or complaints today. She denies signs of infection. 9/28; patient presents for follow-up. She reports a new wound to her right heel. She states this occurred a few days ago and is progressively gotten worse. She has been trying to clean the area with a Q-tip and Santyl. She reports stability in the other 2 wounds. She has been using gentamicin cream and silver alginate to the left foot and Santyl to the right lower extremity wound. 10/12; patient presents for follow-up. She reports improvement to the wound beds. She is seeing vein and vascular to discuss the potential of a left BKA. She states they are going to do an arteriogram. She continues to use silver alginate with dressing changes to her wounds. 11/2; patient presents for follow-up. She states she has not been doing dressing changes to the wound beds. She states she is not able to offload the areas. She reports chronic pain to her left foot wound. 11/9; patient presents for follow-up. She came in with only socks on. She states she forgot to put on shoes. It is unclear if she is doing any dressing changes. She currently denies systemic signs of infection. Electronic Signature(s) Signed: 09/01/2021 12:57:11 PM By: Kalman Shan DO Entered By: Kalman Shan on 09/01/2021 12:45:39 [name] ([id number]) -------------------------------------------------------------------------------- Physical Exam  Details Patient Name: Heather Dear A. Date of Service: 09/01/2021 10:45 AM Medical Record Number: [id number] Patient Account Number: [id number] Date of Birth/Sex: [birth year] (45 y.o. F) Treating RN: Cornell Barman Primary Care Provider: Raelene Bott Other Clinician: Referring Provider: Raelene Bott Treating Provider/Extender: Yaakov Guthrie in Treatment: 23 Constitutional . Cardiovascular . Psychiatric . Notes Left foot (Transmetatarsal amputation): Wound to the plantar aspect that has granulation tissue (Although tissue does not appear healthy) and nonviable tissue present. Increased redness and warmth to the dorsal aspect of the foot. Right lower extremity: To the anterior aspect there is an open wound with Granulation tissue and scant nonviable tissue present. Right foot: To the heel there is an open wound with Granulation tissue present. Circumferential callus. Circumferential undermining noted. Electronic Signature(s) Signed: 09/01/2021 12:57:11 PM By: Kalman Shan DO Entered By: Kalman Shan on 09/01/2021 12:47:24 [name] ([id number]) -------------------------------------------------------------------------------- Physician Orders Details Patient Name: Heather Dear A. Date of Service: 09/01/2021 10:45 AM Medical Record Number: [id number] Patient Account Number: [id number] Date of Birth/Sex: Mar 13, 1976 (45 y.o. F) Treating RN: Cornell Barman Primary Care Provider: Raelene Bott Other Clinician: Referring Provider: Raelene Bott Treating Provider/Extender: Yaakov Guthrie in Treatment: 33 Verbal / Phone Orders: No Diagnosis  Coding Follow-up Appointments o Return Appointment in 1 week. Bathing/ Shower/ Hygiene o Clean wound with Normal Saline or wound cleanser. Off-Loading Left Lower Extremity o Open toe surgical shoe with peg assist. o Turn and reposition every 2 hours o Other: - peg shoe right Medications-Please add to  medication list. o P.O. Antibiotics Wound Treatment Wound #7 - Foot Wound Laterality: Plantar, Left, Midline Cleanser: Soap and Water 1 x Per Day/30 Days Discharge Instructions: Gently cleanse wound with antibacterial soap, rinse and pat dry prior to dressing wounds Primary Dressing: Hydrofera Blue Ready Transfer Foam, 4x5 (in/in) (Generic) 1 x Per Day/30 Days Discharge Instructions: Apply Hydrofera Blue Ready to wound bed as directed Secured With: Kerlix Roll Sterile or Non-Sterile 6-ply 4.5x4 (yd/yd) 1 x Per Day/30 Days Discharge Instructions: Apply Kerlix as directed Wound #8 - Lower Leg Wound Laterality: Right, Anterior Cleanser: Soap and Water 1 x Per Day/30 Days Discharge Instructions: Gently cleanse wound with antibacterial soap, rinse and pat dry prior to dressing wounds Primary Dressing: Hydrofera Blue Ready Transfer Foam, 4x5 (in/in) (Generic) 1 x Per Day/30 Days Discharge Instructions: Apply Hydrofera Blue Ready to wound bed as directed Secured With: Kerlix Roll Sterile or Non-Sterile 6-ply 4.5x4 (yd/yd) 1 x Per Day/30 Days Discharge Instructions: Apply Kerlix as directed Wound #9 - Calcaneus Wound Laterality: Right Cleanser: Soap and Water 1 x Per Day/30 Days Discharge Instructions: Gently cleanse wound with antibacterial soap, rinse and pat dry prior to dressing wounds Primary Dressing: Hydrofera Blue Ready Transfer Foam, 4x5 (in/in) (Generic) 1 x Per Day/30 Days Discharge Instructions: Apply Hydrofera Blue Ready to wound bed as directed Secured With: Kerlix Roll Sterile or Non-Sterile 6-ply 4.5x4 (yd/yd) 1 x Per Day/30 Days Discharge Instructions: Apply Kerlix as directed Patient Medications Allergies: erythromycin base Notifications Medication Indication Start End clindamycin HCl 09/01/2021 DOSE 1 - oral 300 mg capsule - 1 capsule oral q6h x 10 days [name], [name] ([id number]) Notifications Medication Indication Start End Electronic Signature(s) Signed: 09/01/2021  12:55:05 PM By: Kalman Shan DO Entered By: Kalman Shan on 09/01/2021 12:55:04 [name], [name] ([id number]) -------------------------------------------------------------------------------- Problem List Details Patient Name: Heather Dear A. Date of Service: 09/01/2021 10:45 AM Medical Record Number: [id number] Patient Account Number: [id number] Date of Birth/Sex: 1975/10/30 (45 y.o. 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 03/19/2021 No Yes severity S81.801D Unspecified open wound, right lower leg, subsequent encounter 07/07/2021 No Yes E11.621 Type 2 diabetes mellitus with foot ulcer 03/19/2021 No Yes E11.42 Type 2 diabetes mellitus with diabetic polyneuropathy 03/19/2021 No Yes S91.301A Unspecified open wound, right foot, initial encounter 07/21/2021 No Yes Inactive Problems ICD-10 Code Description Active Date Inactive Date S81.801A Unspecified open wound, right lower leg, initial encounter 06/30/2021 06/30/2021 Resolved Problems Electronic Signature(s) Signed: 09/01/2021 12:57:11 PM By: Kalman Shan DO Entered By: Kalman Shan on 09/01/2021 12:44:16 [name], [name] ([id number]) -------------------------------------------------------------------------------- Progress Note Details Patient Name: Heather Dear A. Date of Service: 09/01/2021 10:45 AM Medical Record Number: [id number] Patient Account Number: [id number] Date of Birth/Sex: 04-04-1976 (45 y.o. F) Treating RN: Cornell Barman Primary Care Provider: Raelene Bott Other Clinician: Referring Provider: Raelene Bott Treating Provider/Extender: Yaakov Guthrie in Treatment: 42 Subjective Chief  Complaint Information obtained from Patient 03/19/2021; patient referred by Dr. Luana Shu who has been looking after her left foot  for quite a period of time for review of a nonhealing area in the left midfoot History of Present Illness (HPI) 01/18/18-She is here for initial evaluation of the left great toe ulcer. She is a poor historian in regards to timeframe in detail. She states approximately 4 weeks ago she lacerated her toe on something in the house. She followed up with her primary care who placed her on Bactrim and ultimately a second dose of Bactrim prior to coming to wound clinic. She states she has been treating the toe with peroxide, Betadine and a Band-Aid. She did not check her blood sugar this morning but checked it yesterday morning it was 327; she is unaware of a recent A1c and there are no current records. She saw Dr. she would've orthopedics last week for an old injury to the left ankle, she states he did not see her toe, nor did she bring it to his attention. She smokes approximately 1 pack cigarettes a day. Her social situation is concerning, she arrives this morning with her mother who appears extremely intoxicated/under the influence; her mother was asked to leave the room and be monitored by the patient's grandmother. The patient's aunt then accompanied the patient and the room throughout the rest of the appointment. We had a lengthy discussion regarding the deleterious effects of uncontrolled hyperglycemia and smoking as it relates to wound healing and overall health. She was strongly encouraged to decrease her smoking and get her diabetes under better control. She states she is currently on a diet and has cut down her [facility] consumption. The left toe is erythematous, macerated and slightly edematous with malodor present. The edema in her left foot is below her baseline, there is no erythema streaking. We will treat her with Santyl, doxycycline; we have ordered and xray,  culture and provided a Peg assist surgical shoe and cultured the wound. 01/25/18-She is here in follow-up evaluation for a left great toe ulcer and presents with an abscess to her suprapubic area. She states her blood sugars remain elevated, feeling "sick" and if levels are below 250, but she is trying. She has made no attempt to decrease her smoking stating that we "can't take away her food in her cigarettes". She has been compliant with offloading using the PEG assist you. She is using Santyl daily. the culture obtained last week grew staph aureus and Enterococcus faecalis; continues on the doxycycline and Augmentin was added on Monday. The suprapubic area has erythema, no femoral variation, purple discoloration, minimal induration, was accessed with a cotton tip applicator with sanguinopurulent drainage, this was cultured, I suspect the current antibiotic treatment will cover and we will not add anything to her current treatment plan. She was advised to go to urgent care or ER with any change in redness, induration or fever. 02/01/18-She is here in follow-up evaluation for left great toe ulcers and a new abdominal abscess from last week. She was able to use packing until earlier this week, where she "forgot it was there". She states she was feeling ill with GI symptoms last week and was not taking her antibiotic. She states her glucose levels have been predominantly less than 200, with occasional levels between 200-250. She thinks this was contributing to her GI symptoms as they have resolved without intervention. There continues to be significant laceration to left toe, otherwise it clinically looks stable/improved. There is now less superficial opening to the lateral aspect of the great toe that was residual blister. We will transition  to [facility] Blue to all wounds, she will continue her Augmentin. If there is no change or deterioration next week for reculture. 02/08/18-She is here in follow-up  evaluation for left great toe ulcer and abdominal ulcer. There is an improvement in both wounds. She has been wrapping her left toe with coban, not by our direction, which has created an area of discoloration to the medial aspect; she has been advised to NOT use coban secondary to her neuropathy. She states her glucose levels have been high over this last week ranging from 200-350, she continues to smoke. She admits to being less compliant with her offloading shoe. We will continue with same treatment plan and she will follow-up next week. 02/15/18-She is here in follow-up evaluation for left great toe ulcer and abdominal ulcer. The abdominal ulcer is epithelialized. The left great toe ulcer is improved and all injury from last week using the Coban wrap is resolved, the lateral ulcer is healed. She admits to noncompliance with wearing offloading shoe and admits to glucose levels being greater than 300 most of the week. She continues to smoke and expresses no desire to quit. There is one area medially that probes deeper than it has historically, erythema to the toe and dorsal foot has consistently waxed and waned. There is no overt signs of cellulitis or infection but we will culture the wound for any occult infection given the new area of depth and erythema. We will hold off on sensitivities for initiation of antibiotic therapy. 02/22/18-She is here in follow up evaluation for left great toe ulcer. There is overall significant improvement in both wound appearance, erythema and edema with changes made last week. She was not initiated on antibiotic therapy. Culture obtained last week showed oxacillin sensitive staph aureus, sensitive to clindamycin. Clindamycin has been called into the pharmacy but she has been instructed to hold off on initiation secondary to overall clinical improvement and her history of antibiotic intolerance. She has been instructed to contact the clinic with any  noted changes/deterioration and the wound, erythema, edema and/or pain. She will follow-up next week. She continues to smoke and her glucose levels remain elevated >250; she admits to compliance with offloading shoe 03/01/18 on evaluation today patient appears to be doing fairly well in regard to her left first toe ulcer. She has been tolerating the dressing changes with the [facility] Dressing without complication and overall this has definitely showed signs of improvement according to records as well is what the patient tells me today. I'm very pleased in that regard. She is having no pain today 03/08/18 She is here for follow up evaluation of a left great toe ulcer. She remains non-compliant with glucose control and smoking cessation; glucose levels consistently >200. She states that she got new shoe inserts/peg assist. She admits to compliance with offloading. Since my last evaluation there is significant improvement. We will switch to prisma at this time and she will follow up next week. She is noted to be tachycardic at this appointment, heart rate 120s; she has a history of heart rate 70-130 according to our records. She admits to extreme agitation r/t personal issues; she was advised to monitor her heartrate and contact her physician if it does not return to a more normal range (<100). She takes cardizem twice daily. 03/15/18-She is here in follow-up evaluation for left great toe ulcer. She remains noncompliant with glucose control and smoking cessation. She admits to compliance with wearing offloading shoe. The ulcer is improved/stable and  we will continue with the same treatment plan and she will follow-up next week 03/22/18-She is here for evaluation for left great toe ulcer. There continues to be significant improvement despite recurrent hyperglycemia (over 500 yesterday) and she continues to smoke. She has been compliant with offloading and we will continue with same treatment plan and  she will follow-up next week. Compean, Shir A. ([id number]) 03/29/18-She is here for evaluation for left great toe ulcer. Despite continuing to smoke and uncontrolled diabetes she continues to improve. She is compliant with offloading shoe. We will continue with the same treatment plan and she will follow-up next week 04/05/18- She is here in follow up evaluation for a left great toe ulcer; she presents with small pustule to left fifth toe (resembles ant bite). She admits to compliance with wearing offloading shoe; continues to smoke or have uncontrolled blood glucose control. There is more callus than usual with evidence of bleeding; she denies known trauma. 04/12/18-She is here for evaluation of left great toe ulcer. Despite noncompliance with glycemic control and smoking she continues to make improvement. She continues to wear offloading shoe. The pustule, that was identified last week, to the left fifth toe is resolved. She will follow-up in 2 weeks 05/03/18-she is seen in follow-up evaluation for a left great toe ulcer. She is compliant with offloading, otherwise noncompliant with glycemic control and smoking. She has plateaued and there is minimal improvement noted. We will transition to [facility], replaced the insert to her surgical shoe and she will follow-up in one week 05/10/18- She is here in follow up evaluation for a left great toe ulcer. It appears stable despite measurement change. We will continue with same treatment plan and follow up next week. 05/24/18-She is seen in follow-up evaluation for a left great toe ulcer. She remains compliant with offloading, has made significant improvement in her diet, decreasing the amount of sugar/soda. She said her recent A1c was 10.9 which is lower than. She did see a diabetic nutritionist/educator yesterday. She continues to smoke. We will continue with the same treatment plan and she'll follow-up next week. 05/31/18- She is seen in follow-up  evaluation for left great toe ulcer. She continues to remain compliant with offloading, continues to make improvement in her diet, increasing her water and decreasing the amount of sugar/soda. She does continue to smoke with no desire to quit. We will apply Prisma to the depth and Hydrofera Blue over. We have not received insurance authorization for oasis. She will follow up next week. 06/07/18-She is seen in follow-up evaluation for left great toe ulcer. It has stalled according to today's measurements although base appears stable. She says she saw a diabetic educator yesterday; her average blood sugars are less than 300 which is an improvement for her. She continues to smoke and states "that's my next step" She continues with water over soda. We will order for xray, culture and reinstate ace wrap compression prior to placing apligraf for next week. She is voicing no complaints or concerns. Her dressing will change to iodoflex over the next week in preparation for apligraf. 06/14/18-She is seen in follow-up evaluation for left great toe ulcer. Plain film x-ray performed last week was negative for osteomyelitis. Wound culture obtained last week grew strep B and OSSA; she is initiated on keflex and cefdinir today; there is erythema to the toe which could be from ace wrap compression, she has a history of wrapping too tight and has has been encouraged to maintain ace wraps  that we place today. We will hold off on application of apligraf today, will apply next week after antibiotic therapy has been initiated. She admits today that she has resumed taking a shower with her foot/toe submerged in water, she has been reminded to keep foot/toe out of the bath water. She will be seen in follow up next week 06/21/18-she is seen in follow-up evaluation for left great toe ulcer. She is tolerating antibiotic therapy with no GI disturbance. The wound is stable. Apligraf was applied today. She has been decreasing her  smoking, only had 4 cigarettes yesterday and 1 today. She continues being more compliant in diabetic diet. She will follow-up next week for evaluation of site, if stable will remove at 2 weeks. 06/28/18- She is here in follow up evalution. Apligraf was placed last week, she states the dressing fell off on Tuesday and she was dressing with hydrofera blue. She is healed and will be discharged from the clinic today. She has been instructed to continue with smoking cessation, continue monitoring glucose levels, offloading for an additional 4 weeks and continue with hydrofera blue for additional two weeks for any possible microscopic opening. Readmission: 08/07/18 on evaluation today patient presents for reevaluation concerning the ulcer of her right great toe. She was previously discharged on 06/28/18 healed. Nonetheless she states that this began to show signs of drainage she subsequently went to her primary care provider. Subsequently an x-ray was performed on 08/01/18 which was negative. The patient was also placed on antibiotics at that time. Fortunately they should have been effective for the infection. Nonetheless she's been experiencing some improvement but still has a lot of drainage coming from the wound itself. 08/14/18 on evaluation today patient's wound actually does show signs of improvement in regard to the erythema at this point. She has completed the antibiotics. With that being said we did discuss the possibility of placing her in a total contact cast as of today although I think that I may want to give this just a little bit more time to ensure nothing recurrence as far as her infection is concerned. I do not want to put in the cast and risk infection at that time if things are not completely resolved. With that being said she is gonna require some debridement today. 08/21/18 on evaluation today patient actually appears to be doing okay in regard to her toe ulcer. She's been tolerating the  dressing changes without complication. With that being said it does appear that she is ready and in fact I think it's appropriate for [place] to go ahead and initiate the total contact cast today. Nonetheless she will require some sharp debridement to prepare the wound for application. Overall I feel like things have been progressing well but we do need to do something to get this to close more readily. 08/24/18 patient seen today for reevaluation after having had the total contact cast applied on Tuesday. She seems to have done very well the wound appears to be doing great and overall I'm pleased with the progress that she's made. There were no abnormal areas of rubbing from the cast on her lower extremity. 08/30/18 on evaluation today patient actually appears to be completely healed in regard to her plantar toe ulcer. She tells me at this point she's been having a lot of issues with the cast. She almost fell a couple of times the state shall the step of her dog a couple times as well. This is been a very frustrating process  for her other nonetheless she has completely healed the wound which is excellent news. Overall there does not appear to be the evidence of infection at this time which is great news. 09/11/18 evaluation today patient presents for follow-up concerning her great toe ulcer on the left which has unfortunately reopened since I last saw her which was only a couple of weeks ago. Unfortunately she was not able to get in to get the shoe and potentially the AFO that's gonna be necessary due to her left foot drop. She continues with offloading shoe but this is not enough to prevent her from reopening it appears. When we last had her in the total contact cast she did well from a healing standpoint but unfortunately the wound reopened as soon as she came out of the cast within just a couple of weeks. 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 This is also part of the reason why she falls on a regular basis. I just do not believe that is gonna be safe for [place] to reinitiate the total contact cast as last time we had this on she fell 3 times one week which is definitely not normal for her. 09/18/18 upon evaluation today the patient actually appears to be doing about the same in regard to her toe ulcer. She did not contact [facility] as I asked her to even though I had given her the prescription. In fact she actually states that she has no idea where the prescription is. She did apparently call [facility] and they told her that all she needed to do was bring the prescription in order to be able to be seen and work on getting the AFO for her left foot. With all that being said she still does not have an appointment and I'm not sure were things stand that regard. I will give her a new prescription today in order to contact them to get this set up. [name], GALATI A. ([id number]) 09/25/18 on evaluation today patient actually appears to be doing about the same in regard to her toes ulcer. She does have a small areas which seems to have a lot of callous buildup around the edge of the wound which is going to need sharp debridement today. She still is waiting to be scheduled for evaluation with [facility] for possibility of an AFO. She states there supposed to call her tomorrow to get this set up. Unfortunately it does appear that her foot specifically the toe area is showing signs of erythema. There does not appear to be any systemic infection which is in these good news. 10/02/18 on evaluation today patient actually appears to be doing about the same in regard to her toe ulcer. This really has not done too well although it's not significantly larger it's also not significantly smaller. She has been tolerating the dressing  changes without complication. She actually has her appointment with [facility] and [facility] tomorrow to hopefully be measured for obtaining and AFO splint. I think this would be helpful preventing this from reoccurring. We had contemplated starting the cast this week although to be honest I am reluctant to do that as she's been having nausea, vomiting, and seizure activity over the past three days. She has a history of seizures and have been told  is nothing that can be done for these. With that being said I do believe that along with the seizures have the nausea vomiting which upon further questioning doesn't seem to be the normal for her and makes me concerned for the possibility of infection or something else going on. I discussed this with the patient and her mother during the office visit today. I do not feel the wound is effective but maybe something else. The responses this was "this just happens to her at times and we don't know why". They did not seem to be interested in going to the hospital to have this checked out further. 10/09/18 on evaluation today patient presents for follow-up concerning her ongoing toe ulcer. She has been tolerating the dressing changes without complication. Fortunately there does not appear to be any evidence of infection which is great news however I do think that the patient would benefit from going ahead for with the total contact cast. She's actually in a wheelchair today she tells me that she will use her walker if we initiate the cast. I was very specific about the fact that if we were gonna do the cast I wanted to make sure that she was using the walker in order to prevent any falls. She tells me she does not have stairs that she has to traverse on a regular basis at her home. She has not had any seizures since last week again that something that happens to her often she tells me she did talk to [employer] and they said that it may take up to three weeks to get the  brace approved for her. Hopefully that will not take that long but nonetheless in the meantime I do think the cast could be of benefit. 10/12/18 on evaluation today patient appears to be doing rather well in regard to her toe ulcer. It's just been a few days and already this is significantly improved both as far as overall appearance and size. Fortunately there's no sign of infection. She is here for her first obligatory cast change. 10/19/18 Seen today for follow up and management of left great toe ulcer. Wound continues to show improvement. Noted small open area with seroussang drainage with palpation. Denies any increased pain or recent fevers during visit. She will continue calcium alginate with offloading shoe. Denies any questions or concerns during visit. 10/26/18 on evaluation today patient appears to be doing about the same as when I last saw her in regard to her wound bed. Fortunately there does not appear to be any signs of infection. Unfortunately she continues to have a breakdown in regard to the toe region any time that she is not in the cast. It takes almost no time at all for this to happen. Nonetheless she still has not heard anything from the brace being made by [employer] as to when exactly this will be available to her. Fortunately there is no signs of infection at this time. 10/30/18 on evaluation today patient presents for application of the total contact cast as we just received him this morning. Fortunately we are gonna be able to apply this to her today which is great news. She continues to have no significant pain which is good news. Overall I do feel like things have been improving while she was the cast is when she doesn't have a cast that things get worse. She still has not really heard anything from [place] regarding her brace. 11/02/18 upon evaluation today patient's wound already appears to  be doing significantly better which is good news. Fortunately there does not appear to  be any signs of infection also good news. Overall I do think the total contact cast as before is helping to heal this area unfortunately it's just not gonna likely keep the area closed and healed without her getting her brace at least. Again the foot drop is a significant issue for her. 11/09/18 on evaluation today patient appears to be doing excellent in regard to her toe ulcer which in fact is completely healed. Fortunately we finally got the situation squared away with the paperwork which was needed to proceed with getting her brace approved by Medicaid. I have filled that out unfortunately that information has been sent to the orthopedic office that I worked at 2 1/2 years ago and not tired Current wound care measures. Fortunately she seems to be doing very well at this time. 11/23/18 on evaluation today patient appears to be doing More Poorly Compared to Last Time I Saw Her. At [facility] She Had Completely Healed. Currently she is continuing to have issues with reopening. She states that she just found out that the brace was approved through Medicaid now she just has to go get measured in order to have this fitted for her and then made. Subsequently she does not have an appointment for this yet that is going to complicate things we obviously cannot put her back in the cast if we do not have everything measured because they're not gonna be able to measure her foot while she is in the cast. 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 Obviously this is unfortunate and does have me somewhat worried as well. 11/30/18 on evaluation today patient's toe ulcer actually appears to be doing fairly well. The good news is she will be getting her brace in the shoes next week on Wednesday. Hopefully we will be able to get this to heal without having to go back in the cast however she may need the cast in order to get  the wound completely heal and then go from there. Fortunately there's no signs of infection at this time. 12/07/18 on evaluation today patient fortunately did receive her brace and she states she could tell this definitely makes her walk better. With that being said she's been having issues with her toe where she noticed yesterday there was a lot of tissue that was loosing off this appears to be much larger than what it was previous. She also states that her leg has been read putting much across the top of her foot just about the ankle although this seems to be receiving somewhat. The total area is still red and appears to be someone infected as best I can tell. She is previously taken Bactrim and that may be a good option for her today as well. We are gonna see what I wound culture shows as well and I think that this is definitely appropriate. With that being said outside of the culture I still need to initiate something in the interim and that's what I'm gonna go ahead and select Bactrim is a good option for her. 12/14/18 on evaluation today patient appears to be doing better in regard to her left great toe ulcer as compared to last week's evaluation. There's still some erythema although this is significantly improved which is excellent news. Overall I do believe that  she is making good progress is still gonna take some time before she is where I would like her to be from the standpoint of being able to place her back into the total contact cast. Hopefully we will be where we need to be by next week. 12/21/18 on evaluation today patient actually appears to be doing poorly in regard to her toe ulcer. She's been tolerating the dressing changes without complication. Fortunately there's no signs of systemic infection although she does have a lot of drainage from the toe ulcer and this does Alonso, Ahmiyah A. ([id number]) seem to be causing some issues at this point. She does have erythema on the distal  portion of her toe that appears to be likely cellulitis. 12/28/18 on evaluation today patient actually appears to be doing a little better in my pinion in regard to her toe ulcer. With that being said she still does have some evidence of infection at this time and for her culture she had both E. coli as well as enterococcus as organisms noted on evaluation. For that reason I think that though the Keflex likely has treated the E. coli rather well this has really done nothing for the enterococcus. We are going to have to initiate treatment for this specifically. 01/04/19 on evaluation today patient's toe actually appears to be doing better from the standpoint of infection. She currently would like to see about putting the cash back on I think that this is appropriate as long as she takes care of it and keeps it from getting wet. She is gonna have some drainage we can definitely pass this up with Drawtex and alginate to try to prevent as much drainage as possible from causing the problems. With that being said I do want to at least try her with the cast between now and Tuesday. If there any issues we can't continue to use it then I will discontinue the use of the cast at that point. 01/08/19 on evaluation today patient actually appears to be doing very well as far as her foot ulcer specifically the great toe on the left is concerned. She did have an area of rubbing on the medial aspect of her left ankle which again is from the cast. Fortunately there's no signs of infection at this point in this appears to be a very slight skin breakdown. The patient tells me she felt it rubbing but didn't think it was that bad. Fortunately there is no signs of active infection at this time which is good news. No fevers, chills, nausea, or vomiting noted at this time. 01/15/19 on evaluation today patient actually appears to be doing well in regard to her toe ulcer. Again as previous she seems to do well and she has the cast  on which indicates to me that during the time she doesn't have a cast on she's putting way too much pressure on this region. Obviously I think that's gonna be an issue as with the current national emergency concerning the Covid-19 Virus it has been recommended that we discontinue the use of total contact casting by the chief medical officer of our company, Dr. Simona Huh. The reasoning is that if a patient becomes sick and cannot come into have the cast removed they could not just leave this on for an additional two weeks. Obviously the hospitals also do not want to receive patient's who are sick into the emergency department to potentially contaminate the region and spread the Covid-19 Virus among other sick individuals within  the hospital system. Therefore at this point we are suspending the use of total contact cast until the current emergency subsides. This was all discussed with the patient today as well. 01/22/19 on evaluation today patient's wound on her left great toe appears to be doing slightly worse than previously noted last week. She tells me that she has been on this quite a bit in fact she tells me she's been awake for 38 straight hours. This is due to the fact that she's having to care for grandparents because nobody else will. She has been taking care of them for five the last seven days since I've seen her they both have dementia his is from a stroke and her grandmother's was progressive. Nonetheless she states even her mom who knows her condition and situation has only help two of those days to take care of them she's been taking care of the rest. Fortunately there does not appear to be any signs of active infection in regard to her toe at this point although obviously it doesn't look as good as it did previous. I think this is directly related to her not taking off the pressure and friction by way of taking things easy. Though I completely understand what's going on. 01/29/19 on evaluation  today patient's tools are actually appears to be showing some signs of improvement today compared to last week's evaluation as far as not necessarily the overall size of the wound but the fact that she has some new skin growth in between the two ends of the wound opening. Overall I feel like she has done well she states that she had a family member give her what sounds to be a CAM walker boot which has been helpful as well. 02/05/19 on evaluation today patient's wound bed actually appears to be doing significantly better in regard to her overall appearance of the size of the wound. With that being said she is still having an issue with offloading efficiently enough to get this to close. Apparently there is some signs of infection at this point as well unfortunately. Previously she's done well of Augmentin I really do not see anything that needs to be culture currently but there theme and cellulitis of the foot that I'm seeing I'm gonna go ahead and place her on an antibiotic today to try to help clear this up. 02/12/2019 on evaluation today patient actually appears to be doing poorly in regard to her overall wound status. She tells me she has been using her offloading shoe but actually comes in today wearing her tennis shoe with the AFO brace. Again as I previously discussed with her this is really not sufficient to allow the area to heal appropriately. Nonetheless she continues to be somewhat noncompliant and I do wonder based on what she has told my nurse in the past as to whether or not a good portion of this noncompliance may be recreational drug and alcohol related. She has had a history of heroin overdose and this was fairly recently in the past couple of months that have been seeing her. Nonetheless overall I feel like her wound looks significantly worse today compared to what it was previous. She still has significant erythema despite the Augmentin I am not sure that this is an appropriate  medication for her infection I am also concerned that the infection may have gone down into her bone. 02/19/19 on evaluation today patient actually appears to be doing about the same in regard to her toe  ulcer. Unfortunately she continues to show signs of bone exposure and infection at this point. There does not appear to be any evidence of worsening of the infection but I'm also not really sure that it's getting significantly better. She is on the Augmentin which should be sufficient for the Staphylococcus aureus infection that she has at this point. With that being said she may need IV antibiotics to more appropriately treat this. We did have a discussion today about hyperbaric option therapy. 02/28/19 on evaluation today patient actually appears to be doing much worse in regard to the wound on her left great toe as compared to even my previous evaluation last week. Unfortunately this seems to be training in a pretty poor direction. Her toe was actually now starting to angle laterally and I can actually see the entire joint area of the proximal portion of the digit where is the distal portion of the digit again is no longer even in contact with the joint line. Unfortunately there's a lot more necrotic tissue around the edge and the toe appears to be showing signs of becoming gangrenous in my pinion. I'm very concerned about were things stand at this point. She did see infectious disease and they are planning to send in a prescription for Sivextro for her and apparently this has been approved. With that being said I don't think she should avoid taking this but at the same time I'm not sure that it's gonna be sufficient to save her toe at this point. 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 03/05/19  on evaluation today patient actually appears to be doing quite well in regard to her toe all things considering. She still has bone exposed but there appears to be much less your thing on overall the appearance of the wound and the toe itself is dramatically improved. She still does have some issues currently obviously with infection she did see vascular as well and there concerned that her blood flow to the toad. For that reason they are setting up for an angiogram next week. 03/14/19 on evaluation today patient appears to be doing very poor in regard to her toe and specifically in regard to the ulceration and the fact that she's starting to notice the toe was leaning even more towards the lateral aspect and the complete joint is visible on the proximal aspect of the joint. Nonetheless she's also noted a significant odor and the tip of the toe is turning more dark and necrotic appearing. Overall I think she is getting worse not better as far as this is concerned. For that reason I am recommending at this point that she likely needs to be seen for likely amputation. [name], [name] ([id number]) READMISSION 03/19/2021 This is a patient that we cared for in this clinic for a prolonged period of time in 2019 and 2020 with a left foot and left first toe wound. 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 Luana Shu. In December 2021 she had an ulcer on her right great toe as well as the fourth and fifth toes. 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 She also  had an angiogram at that time and underwent angioplasty of the right anterior tibial artery. In any case she claims that the wound on the right foot is closed I did not look at this today which was probably an oversight although I think that should be done next week. After her surgery she developed a  dehiscence but I do not see any follow-up of this. According to Dr. Deborra Medina last review that she was out of the area being cared for by another physician but recently came back to his attention. 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 A culture of this area showed E. coli apparently before she came back to see Dr. Luana Shu she was supposed to be receiving antibiotics but she did not really take them. Nor is she offloading this area at all. Finally her last hemoglobin A1c listed in epic was in March 2022 at 14.1 she says things are a lot better since then although I am not sure. 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 She had this wound at the time. During this admission she had E. coli sepsis a TEE was negative. 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 She is still a heavy cigarette smoker. She has a history of bipolar disorder seizure disorder ABI in our clinic was 1.05 6/1; left midfoot in the setting of a TMA done previously. Round circular wound with a "knuckle" of protruding tissue. The problem is that the knuckle was not attached to any of the surrounding granulation and this probed proximally widely I removed a large portion of this tissue. This wound goes with considerable undermining laterally. I do not feel any bone there was no purulence but this is a deep wound. 6/8; in spite of the debridement I did last week. She arrives with a wound looking exactly the same. A protruding "knuckle" of tissue nonadherent to most of the surrounding tissue. There is considerable depth around this from 6-12 o'clock at 2.7 cm and undermining of 1 cm.  This does not look overtly infected and the x-ray I did last week was negative for any osseous abnormalities. We have been using silver collagen 6/15; deep tissue culture I did last week showed moderate staph aureus and moderate Pseudomonas. This will definitely require prolonged antibiotic therapy. The pathology on the protuberant area was negative for malignancy fungus etc. 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 We have been using silver collagen. I am going to be prescribing Levaquin for 2 weeks. Her CT scan of the foot is down for 7/5 6/22; CT scan of the foot on 7 5. She says she has hardware in the left leg from her previous fracture. She is on the Levaquin for the deep tissue culture I did that showed methicillin sensitive staph aureus and Pseudomonas. I gave her a 2-week supply and she will have another week. She arrives in clinic today with the same protuberant tissue however this is nonadherent to the tissue surrounding it. I am really at a loss to explain this unless there is underlying deep tissue infection 6/29; patient presents for 1 week follow-up. She has been using collagen to the wound bed. She reports taking her  antibiotics as prescribed.She has no complaints or issues today. She denies signs of infection. 7/6; patient presents for one week followup. She has been using collagen to the wound bed. She states she is taking Levaquin however at times she is not able to keep it down. She denies signs of infection. 7/13; patient presents for 1 week follow-up. She has been using silver alginate to the wound bed. She still has nausea when taking her antibiotics. She denies signs of infection. 7/20; patient presents for 1 week follow-up. She has been using silver alginate with gentamicin cream to the wound bed. She denies any issues and has no complaints today. She denies signs of infection. 7/27; patient presents for 1 week  follow-up. She continues to use silver alginate with gentamicin cream to the wound bed. She reports starting her antibiotics. She has no issues or complaints. Overall she reports stability to the wound. 8/3; patient presents for 1 week follow-up. She has been using silver alginate with gentamicin cream to the wound bed. She reports completing all antibiotics. She has no issues or complaints today. She denies signs of infection. 8/17; patient presents for 2-week follow-up. He is to use silver alginate to the wound bed. She has no issues or complaints today. She denies signs of infection. She reports her pain has improved in her foot since last clinic visit 8/24; patient presents for 1 week follow-up. She continues to use silver alginate to the wound bed. She has no issues or complaints. She denies signs of infection. Pain is stable. 9/7; patient presents for follow-up. She missed her last week appointment due to feeling ill. She continues to use silver alginate. She has a new wound to the right lower extremity that is covered in eschar. She states It occurred over the past week and has no idea how it started. She currently denies signs of infection. 9/14; patient presents for follow-up. To the left foot wound she has been using gentamicin cream and silver alginate. To the right lower extremity wound she has been keeping this covered and has not obtain Santyl. 9/21; patient presents for follow-up. She reports using gentamicin cream and silver alginate to the left foot and Santyl to the right lower extremity wound. She has no issues or complaints today. She denies signs of infection. 9/28; patient presents for follow-up. She reports a new wound to her right heel. She states this occurred a few days ago and is progressively gotten worse. She has been trying to clean the area with a Q-tip and Santyl. She reports stability in the other 2 wounds. She has been using gentamicin cream and silver alginate to  the left foot and Santyl to the right lower extremity wound. 10/12; patient presents for follow-up. She reports improvement to the wound beds. She is seeing vein and vascular to discuss the potential of a left BKA. She states they are going to do an arteriogram. She continues to use silver alginate with dressing changes to her wounds. 11/2; patient presents for follow-up. She states she has not been doing dressing changes to the wound beds. She states she is not able to offload the areas. She reports chronic pain to her left foot wound. 11/9; patient presents for follow-up. She came in with only socks on. She states she forgot to put on shoes. It is unclear if she is doing any dressing changes. She currently denies systemic signs of infection. Patient History Information obtained from Patient. Social History [name], [name] ([id number]) Current every day  smoker, Marital Status - Single, Alcohol Use - Never, Drug Use - Prior History, Caffeine Use - Daily. Medical History Ear/Nose/Mouth/Throat Patient has history of Chronic sinus problems/congestion, Middle ear problems Hematologic/Lymphatic Patient has history of Anemia Respiratory Patient has history of Chronic Obstructive Pulmonary Disease (COPD) Cardiovascular Patient has history of Congestive Heart Failure - EF 33% Endocrine Patient has history of Type II Diabetes Genitourinary Patient has history of End Stage Renal Disease Integumentary (Skin) Patient has history of History of pressure wounds Neurologic Patient has history of Neuropathy - feel and legs Objective Constitutional Vitals Time Taken: 11:16 AM, Height: 69 in, Weight: 185 lbs, BMI: 27.3, Temperature: 98.5 F, Pulse: 83 bpm, Respiratory Rate: 18 breaths/min, Blood Pressure: 108/73 mmHg. General Notes: Left foot (Transmetatarsal amputation): Wound to the plantar aspect that has granulation tissue (Although tissue does not appear healthy) and nonviable tissue present.  Increased redness and warmth to the dorsal aspect of the foot. Right lower extremity: To the anterior aspect there is an open wound with Granulation tissue and scant nonviable tissue present. Right foot: To the heel there is an open wound with Granulation tissue present. Circumferential callus. Circumferential undermining noted. Integumentary (Hair, Skin) Wound #7 status is Open. Original cause of wound was Gradually Appeared. The date acquired was: 08/07/2020. The wound has been in treatment 23 weeks. The wound is located on the [place]. The wound measures 6cm length x 3.5cm width x 0.9cm depth; 16.493cm^2 area and 14.844cm^3 volume. There is Fat Layer (Subcutaneous Tissue) exposed. There is no tunneling or undermining noted. There is a medium amount of serosanguineous drainage noted. The wound margin is thickened. There is small (1-33%) red, pink, hyper - granulation within the wound bed. There is a large (67-100%) amount of necrotic tissue within the wound bed including Eschar and Adherent Slough. Wound #8 status is Open. Original cause of wound was Gradually Appeared. The date acquired was: 06/30/2021. The wound has been in treatment 9 weeks. The wound is located on the Right,Anterior Lower Leg. The wound measures 2.3cm length x 2cm width x 0.2cm depth; 3.613cm^2 area and 0.723cm^3 volume. There is Fat Layer (Subcutaneous Tissue) exposed. There is no tunneling noted. There is a medium amount of serosanguineous drainage noted. The wound margin is flat and intact. There is medium (34-66%) red, pink granulation within the wound bed. There is a medium (34-66%) amount of necrotic tissue within the wound bed including Adherent Slough. Wound #9 status is Open. Original cause of wound was Gradually Appeared. The date acquired was: 07/18/2021. The wound has been in treatment 6 weeks. The wound is located on the Right Calcaneus. The wound measures 0.5cm length x 0.5cm width x 0.4cm depth;  0.196cm^2 area and 0.079cm^3 volume. There is Fat Layer (Subcutaneous Tissue) exposed. There is undermining starting at 12:00 and ending at 12:00 with a maximum distance of 0.5cm. There is a medium amount of serosanguineous drainage noted. The wound margin is flat and intact. There is medium (34-66%) granulation within the wound bed. There is a medium (34-66%) amount of necrotic tissue within the wound bed including Adherent Slough. Assessment Active Problems ICD-10 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ity Unspecified open wound, right lower leg, subsequent encounter Type 2 diabetes mellitus with foot ulcer Type 2 diabetes mellitus with diabetic polyneuropathy Unspecified open wound, right foot, initial encounter Genson, John A. ([id number]) Patient's left foot wound has worsened. She states she took ciprofloxacin prescribed at last clinic visit. I will give her  clindamycin Since she has still having increased warmth and redness to the foot. I debrided nonviable tissue. The wound has enlarged and patient is not able to relieve the pressure. Unfortunately patient will likely need an amputation of her left foot in the near future. She knows to go to the ED if she has worsening symptoms including but not limited to fever/chills, increased warmth or erythema to the foot spreading to the leg and Purulent drainage. She was given a surgical shoe and Pegasys insert at last clinic visit. She came in with no shoes today. I advised she wear her surgical shoes. The right plantar foot wound is stable. No obvious signs of infection. Her right lower extremity wound is stable as well. At this time I will change the dressing to [facility] to all wound sites. I recommended aggressive offloading. Follow-up in 1 week Procedures Wound #7 Pre-procedure diagnosis of Wound #7 is a Diabetic Wound/Ulcer of the Lower Extremity located on the [place]  .Severity of Tissue Pre Debridement is: Fat layer exposed. There was a Excisional Skin/Subcutaneous Tissue Debridement with a total area of 21 sq cm performed by Kalman Shan, MD. With the following instrument(s): Curette to remove Viable and Non-Viable tissue/material. Material removed includes Eschar, Subcutaneous Tissue, Slough, Skin: Dermis, and Skin: Epidermis. No specimens were taken. A time out was conducted at 11:57, prior to the start of the procedure. A Minimum amount of bleeding was controlled with Pressure. The procedure was tolerated well. Post Debridement Measurements: 6cm length x 3.5cm width x 0.9cm depth; 14.844cm^3 volume. Character of Wound/Ulcer Post Debridement is stable. Severity of Tissue Post Debridement is: Fat layer exposed. Post procedure Diagnosis Wound #7: Same as Pre-Procedure Plan Follow-up Appointments: Return Appointment in 1 week. Bathing/ Shower/ Hygiene: Clean wound with Normal Saline or wound cleanser. Off-Loading: Open toe surgical shoe with peg assist. Turn and reposition every 2 hours Other: - peg shoe right Medications-Please add to medication list.: P.O. Antibiotics The following medication(s) was prescribed: clindamycin HCl oral 300 mg capsule 1 1 capsule oral q6h x 10 days starting 09/01/2021 WOUND #7: - Foot Wound Laterality: Plantar, Left, Midline Cleanser: Soap and Water 1 x Per Day/30 Days Discharge Instructions: Gently cleanse wound with antibacterial soap, rinse and pat dry prior to dressing wounds Primary Dressing: Hydrofera Blue Ready Transfer Foam, 4x5 (in/in) (Generic) 1 x Per Day/30 Days Discharge Instructions: Apply Hydrofera Blue Ready to wound bed as directed Secured With: Kerlix Roll Sterile or Non-Sterile 6-ply 4.5x4 (yd/yd) 1 x Per Day/30 Days Discharge Instructions: Apply Kerlix as directed WOUND #8: - Lower Leg Wound Laterality: Right, Anterior Cleanser: Soap and Water 1 x Per Day/30 Days Discharge Instructions: Gently  cleanse wound with antibacterial soap, rinse and pat dry prior to dressing wounds Primary Dressing: Hydrofera Blue Ready Transfer Foam, 4x5 (in/in) (Generic) 1 x Per Day/30 Days Discharge Instructions: Apply Hydrofera Blue Ready to wound bed as directed Secured With: Kerlix Roll Sterile or Non-Sterile 6-ply 4.5x4 (yd/yd) 1 x Per Day/30 Days Discharge Instructions: Apply Kerlix as directed WOUND #9: - Calcaneus Wound Laterality: Right Cleanser: Soap and Water 1 x Per Day/30 Days Discharge Instructions: Gently cleanse wound with antibacterial soap, rinse and pat dry prior to dressing wounds Primary Dressing: Hydrofera Blue Ready Transfer Foam, 4x5 (in/in) (Generic) 1 x Per Day/30 Days Discharge Instructions: Apply Hydrofera Blue Ready to wound bed as directed Secured With: Kerlix Roll Sterile or Non-Sterile 6-ply 4.5x4 (yd/yd) 1 x Per Day/30 Days Discharge Instructions: Apply Kerlix as directed 1.  In office sharp debridement 2. Clindamycin 3. Aggressive offloading 4. Hydrofera Blue, Santyl 5. Follow-up in 1 week [name], [name] ([id number]) Electronic Signature(s) Signed: 09/01/2021 12:57:11 PM By: Kalman Shan DO Entered By: Kalman Shan on 09/01/2021 12:56:05 [name] ([id number]) -------------------------------------------------------------------------------- ROS/PFSH Details Patient Name: Heather Dear A. Date of Service: 09/01/2021 10:45 AM Medical Record Number: [id number] Patient Account Number: [id number] Date of Birth/Sex: [birth year] (45 y.o. F) Treating RN: Cornell Barman Primary Care Provider: Raelene Bott Other Clinician: Referring Provider: Raelene Bott Treating Provider/Extender: Yaakov Guthrie in Treatment: 23 Information Obtained From Patient Ear/Nose/Mouth/Throat Medical History: Positive for: Chronic sinus problems/congestion; Middle ear problems Hematologic/Lymphatic Medical History: Positive for: Anemia Respiratory Medical  History: Positive for: Chronic Obstructive Pulmonary Disease (COPD) Cardiovascular Medical History: Positive for: Congestive Heart Failure - EF 33% Endocrine Medical History: Positive for: Type II Diabetes Time with diabetes: 14 years Treated with: Insulin, Oral agents Blood sugar tested every day: No Blood sugar testing results: Bedtime: 176 Genitourinary Medical History: Positive for: End Stage Renal Disease Integumentary (Skin) Medical History: Positive for: History of pressure wounds Neurologic Medical History: Positive for: Neuropathy - feel and legs HBO Extended History Items Ear/Nose/Mouth/Throat: Ear/Nose/Mouth/Throat: Chronic sinus problems/congestion Middle ear problems Immunizations Pneumococcal Vaccine: Received Pneumococcal Vaccination: No Hypes, Anira A. ([id number]) Implantable Devices No devices added Family and Social History Current every day smoker; Marital Status - Single; Alcohol Use: Never; Drug Use: Prior History; Caffeine Use: Daily Electronic Signature(s) Signed: 09/01/2021 12:57:11 PM By: Kalman Shan DO Signed: 09/01/2021 5:42:47 PM By: Gretta Cool, BSN, RN, CWS, [name] RN, BSN Entered By: Kalman Shan on 09/01/2021 12:45:49 [name] ([id number]) -------------------------------------------------------------------------------- [facility] Details Patient Name: Heather Dear A. Date of Service: 09/01/2021 Medical Record Number: [id number] Patient Account Number: [id number] Date of Birth/Sex: Sep 16, 1976 (45 y.o. F) Treating RN: Cornell Barman Primary Care Provider: Raelene Bott Other Clinician: Referring Provider: Raelene Bott Treating Provider/Extender: Yaakov Guthrie in Treatment: 23 Diagnosis Coding ICD-10 Codes Code Description [id number] 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ity S81.801D Unspecified open wound, right lower leg, subsequent encounter E11.621 Type 2 diabetes mellitus with  foot ulcer E11.42 Type 2 diabetes mellitus with diabetic polyneuropathy S91.301A Unspecified open wound, right foot, initial encounter Facility Procedures CPT4 Code: [id number] Description: [place] - DEB SUBQ TISSUE 20 SQ CM/< Modifier: Quantity: 1 CPT4 Code: Description: ICD-10 Diagnosis Description 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 E11.621 Type 2 diabetes mellitus with foot ulcer Modifier: severity Quantity: CPT4 Code: [id number] Description: 50932 - DEB SUBQ TISS EA ADDL 20CM Modifier: Quantity: 1 CPT4 Code: Description: ICD-10 Diagnosis Description 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 E11.621 Type 2 diabetes mellitus with foot ulcer Modifier: severity Quantity: Physician Procedures CPT4 Code: 6712458 Description: 11042 - WC PHYS SUBQ TISS 20 SQ CM Modifier: Quantity: 1 CPT4 Code: Description: ICD-10 Diagnosis Description 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 E11.621 Type 2 diabetes mellitus with foot ulcer Modifier: severity Quantity: CPT4 Code: 0998338 Description: 25053 - WC PHYS SUBQ TISS EA ADDL 20 CM Modifier: Quantity: 1 CPT4 Code: Description: ICD-10 Diagnosis Description 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 E11.621 Type 2 diabetes mellitus with foot ulcer Modifier: severity Quantity: Electronic Signature(s) Signed: 09/01/2021 12:57:11 PM By: Kalman Shan DO Entered By: Kalman Shan on 09/01/2021 12:56:15

## 2021-09-02 NOTE — Progress Notes (Signed)
SEHAJ, KOLDEN ([id number]) Visit Report for 09/01/2021 Arrival Information Details Patient Name: [name], [name] A. Date of Service: 09/01/2021 10:45 AM Medical Record Number: [id number] Patient Account Number: [id number] Date of Birth/Sex: Jan 30, 1976 (45 y.o. F) Treating RN: Cornell Barman Primary Care Sunnie Odden: Raelene Bott Other Clinician: Referring Margrete Delude: Raelene Bott Treating Braxtyn Dorff/Extender: Yaakov Guthrie in Treatment: 23 Visit Information History Since Last Visit Added or deleted any medications: No Patient Arrived: Wheel Chair Has Dressing in Place as Prescribed: No Arrival Time: 11:15 Pain Present Now: Yes Accompanied By: mom Transfer Assistance: None Patient Identification Verified: Yes Secondary Verification Process Completed: Yes Patient Requires Transmission-Based No Precautions: Patient Has Alerts: Yes Patient Alerts: Patient on Blood Thinner 81mg  apirin Diabetic Type II Electronic Signature(s) Signed: 09/01/2021 5:42:47 PM By: Gretta Cool, BSN, RN, CWS, [name] RN, BSN Entered By: Gretta Cool, BSN, RN, CWS, [name] on 09/01/2021 11:15:31 [name] ([id number]) -------------------------------------------------------------------------------- Encounter Discharge Information Details Patient Name: Heather Dear A. Date of Service: 09/01/2021 10:45 AM Medical Record Number: [id number] Patient Account Number: [id number] Date of Birth/Sex: November 26, 1975 (45 y.o. F) Treating RN: Cornell Barman Primary Care Kaitlyn Skowron: Raelene Bott Other Clinician: Referring Kelee Cunningham: Raelene Bott Treating Amedeo Detweiler/Extender: Yaakov Guthrie in Treatment: 46 Encounter Discharge Information Items Post Procedure Vitals Discharge Condition: Stable Temperature (F): 98.5 Ambulatory Status: Wheelchair Pulse (bpm): 83 Discharge Destination: Home Respiratory Rate (breaths/min): 16 Transportation: Private Auto Blood Pressure (mmHg): 108/73 Accompanied By: mom Schedule Follow-up  Appointment: Yes Clinical Summary of Care: Electronic Signature(s) Signed: 09/01/2021 5:42:47 PM By: Gretta Cool, BSN, RN, CWS, [name] RN, BSN Entered By: Gretta Cool, BSN, RN, CWS, [name] on 09/01/2021 12:17:34 [name] ([id number]) -------------------------------------------------------------------------------- Lower Extremity Assessment Details Patient Name: Heather Dear A. Date of Service: 09/01/2021 10:45 AM Medical Record Number: [id number] Patient Account Number: [id number] Date of Birth/Sex: 05-Sep-1976 (45 y.o. F) Treating RN: Cornell Barman Primary Care [place] Kielbasa: Raelene Bott Other Clinician: Referring Kenora Spayd: Raelene Bott Treating Katrina Brosh/Extender: Yaakov Guthrie in Treatment: 23 Vascular Assessment Pulses: Dorsalis Pedis Palpable: [Left:Yes] [Right:Yes] Electronic Signature(s) Signed: 09/01/2021 5:42:47 PM By: Gretta Cool, BSN, RN, CWS, [name] RN, BSN Entered By: Gretta Cool, BSN, RN, CWS, [name] on 09/01/2021 11:30:55 [name] ([id number]) -------------------------------------------------------------------------------- Multi Wound Chart Details Patient Name: Heather Dear A. Date of Service: 09/01/2021 10:45 AM Medical Record Number: [id number] Patient Account Number: [id number] Date of Birth/Sex: Nov 28, 1975 (45 y.o. 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 Extremity  Extremity Comorbid History: Chronic sinus problems/congestion, Chronic sinus problems/congestion, Chronic sinus problems/congestion, Middle ear problems, Anemia, Middle ear problems, Anemia, Middle ear problems, Anemia, Chronic Obstructive Pulmonary Chronic Obstructive Pulmonary Chronic Obstructive Pulmonary Disease (COPD), Congestive Heart Disease (COPD), Congestive Heart Disease (COPD), Congestive Heart Failure, Type II Diabetes, End Stage Failure, Type II Diabetes, End Stage Failure, Type II Diabetes, End Stage Renal Disease, History of pressure Renal Disease, History of pressure Renal Disease, History of pressure wounds, Neuropathy wounds, Neuropathy wounds, Neuropathy Date Acquired: 08/07/2020 06/30/2021 07/18/2021 Weeks of Treatment: 23 9 6  Wound Status: Open Open Open Measurements L x W x D (cm) 6x3.5x0.9 2.3x2x0.2 0.5x0.5x0.4 Area (cm) : 16.493 3.613  0.196 Volume (cm) : 14.844 0.723 0.079 % Reduction in Area: -517.70% 42.50% 40.60% % Reduction in Volume: -1011.90% -15.10% 65.80% Starting Position 1 (o'clock): 12 Ending Position 1 (o'clock): 12 Maximum Distance 1 (cm): 0.5 Undermining: No N/A Yes Classification: Grade 2 Grade 1 Grade 1 Exudate Amount: Medium Medium Medium Exudate Type: Serosanguineous Serosanguineous Serosanguineous Exudate Color: red, brown red, brown red, brown Wound Margin: Thickened Flat and Intact Flat and Intact Granulation Amount: Small (1-33%) Medium (34-66%) Medium (34-66%) Granulation Quality: Red, Pink, Hyper-granulation Red, Pink N/A Necrotic Amount: Large (67-100%) Medium (34-66%) Medium (34-66%) Necrotic Tissue: Eschar, Adherent [name] Exposed Structures: Fat Layer (Subcutaneous Tissue): Fat Layer (Subcutaneous Tissue): Fat Layer (Subcutaneous Tissue): Yes Yes Yes Fascia: No Fascia: No Fascia: No Tendon: No Tendon: No Tendon: No Muscle: No Muscle: No Muscle: No Joint: No Joint: No Joint: No Bone:  No Bone: No Bone: No Epithelialization: None None Large (67-100%) Debridement: Debridement - Excisional N/A N/A Pre-procedure Verification/Time 11:57 N/A N/A Out Taken: [name], [name] ([id number]) Tissue Debrided: Necrotic/Eschar, Subcutaneous, N/A N/A Slough Level: Skin/Subcutaneous Tissue N/A N/A Debridement Area (sq cm): 21 N/A N/A Instrument: Curette N/A N/A Bleeding: Minimum N/A N/A Hemostasis Achieved: Pressure N/A N/A Debridement Treatment Procedure was tolerated well N/A N/A Response: Post Debridement 6x3.5x0.9 N/A N/A Measurements L x W x D (cm) Post Debridement Volume: 14.844 N/A N/A (cm) Procedures Performed: Debridement N/A N/A Treatment Notes Wound #7 (Foot) Wound Laterality: Plantar, Left, Midline Cleanser Soap and Water Discharge Instruction: Gently cleanse wound with antibacterial soap, rinse and pat dry prior to dressing wounds Peri-Wound Care Topical Primary Dressing Hydrofera Blue Ready Transfer Foam, 4x5 (in/in) Discharge Instruction: Apply Hydrofera Blue Ready to wound bed as directed Secondary Dressing Secured With Kerlix Roll Sterile or Non-Sterile 6-ply 4.5x4 (yd/yd) Discharge Instruction: Apply Kerlix as directed Compression Wrap Compression Stockings Add-Ons Wound #8 (Lower Leg) Wound Laterality: Right, Anterior Cleanser Soap and Water Discharge Instruction: Gently cleanse wound with antibacterial soap, rinse and pat dry prior to dressing wounds Peri-Wound Care Topical Primary Dressing Hydrofera Blue Ready Transfer Foam, 4x5 (in/in) Discharge Instruction: Apply Hydrofera Blue Ready to wound bed as directed Secondary Dressing Secured With Kerlix Roll Sterile or Non-Sterile 6-ply 4.5x4 (yd/yd) Discharge Instruction: Apply Kerlix as directed Compression Wrap Compression Stockings Add-Ons [name], [name] ([id number]) Wound #9 (Calcaneus) Wound Laterality: Right Cleanser Soap and Water Discharge Instruction: Gently cleanse wound with  antibacterial soap, rinse and pat dry prior to dressing wounds Peri-Wound Care Topical Primary Dressing Hydrofera Blue Ready Transfer Foam, 4x5 (in/in) Discharge Instruction: Apply Hydrofera Blue Ready to wound bed as directed Secondary Dressing Secured With Kerlix Roll Sterile or Non-Sterile 6-ply 4.5x4 (yd/yd) Discharge Instruction: Apply Kerlix as directed Compression Wrap Compression Stockings Add-Ons Electronic Signature(s) Signed: 09/01/2021 12:57:11 PM By: Kalman Shan DO Entered By: Kalman Shan on 09/01/2021 12:44:36 [name] ([id number]) -------------------------------------------------------------------------------- [place] Details Patient Name: Heather Dear A. Date of Service: 09/01/2021 10:45 AM Medical Record Number: [id number] Patient Account Number: [id number] Date of Birth/Sex: 22-Dec-1975 (45 y.o. F) Treating RN: Cornell Barman Primary Care Tawnya Pujol: Raelene Bott Other Clinician: Referring Athalene Kolle: Raelene Bott Treating Waymon Laser/Extender: Yaakov Guthrie in Treatment: 23 Active Inactive Necrotic Tissue Nursing Diagnoses: Impaired tissue integrity related to necrotic/devitalized tissue Knowledge deficit related to management of necrotic/devitalized tissue Goals: Necrotic/devitalized tissue will be minimized in the wound bed Date Initiated: 04/07/2021 Target Resolution Date: 09/08/2021 Goal Status: Active Patient/caregiver will verbalize understanding of reason and process for debridement of necrotic tissue Date Initiated:  04/07/2021 Target Resolution Date: 09/08/2021 Goal Status: Active Interventions: Assess patient pain level pre-, during and post procedure and prior to discharge Provide education on necrotic tissue and debridement process Treatment Activities: Excisional debridement : 04/07/2021 Notes: Electronic Signature(s) Signed: 09/01/2021 5:42:47 PM By: Gretta Cool, BSN, RN, CWS, [name] RN, BSN Entered By: Gretta Cool,  BSN, RN, CWS, [name] on 09/01/2021 11:56:15 [name] ([id number]) -------------------------------------------------------------------------------- Pain Assessment Details Patient Name: Heather Dear A. Date of Service: 09/01/2021 10:45 AM Medical Record Number: [id number] Patient Account Number: [id number] Date of Birth/Sex: 1976/03/22 (45 y.o. F) Treating RN: Cornell Barman Primary Care Kaileena Obi: Raelene Bott Other Clinician: Referring Shenetta Schnackenberg: Raelene Bott Treating Samara Stankowski/Extender: Yaakov Guthrie in Treatment: 23 Active Problems Location of Pain Severity and Description of Pain Patient Has Paino Yes Site Locations Pain Location: Pain in Ulcers Rate the pain. Current Pain Level: 7 Pain Management and Medication Current Pain Management: Electronic Signature(s) Signed: 09/01/2021 5:42:47 PM By: Gretta Cool, BSN, RN, CWS, [name] RN, BSN Entered By: Gretta Cool, BSN, RN, CWS, [name] on 09/01/2021 11:18:42 [name] ([id number]) -------------------------------------------------------------------------------- Patient/Caregiver Education Details Patient Name: Heather Dear A. Date of Service: 09/01/2021 10:45 AM Medical Record Number: [id number] Patient Account Number: [id number] Date of Birth/Gender: Jul 15, 1976 (45 y.o. F) Treating RN: Cornell Barman Primary Care Physician: Raelene Bott Other Clinician: Referring Physician: Raelene Bott Treating Physician/Extender: Yaakov Guthrie in Treatment: 80 Education Assessment Education Provided To: Patient Education Topics Provided Offloading: Handouts: What is Offloadingo, Other: wear offloading shoes all day every day! Wound Debridement: Handouts: Wound Debridement Methods: Demonstration, Explain/Verbal Responses: State content correctly Wound/Skin Impairment: Handouts: Caring for Your Ulcer, Other: wound care as prescribed. Hydrofera blue given to patient for dressing changes. Methods: Demonstration,  Explain/Verbal Responses: State content correctly Electronic Signature(s) Signed: 09/01/2021 5:42:47 PM By: Gretta Cool, BSN, RN, CWS, [name] RN, BSN Entered By: Gretta Cool, BSN, RN, CWS, [name] on 09/01/2021 12:16:29 [name] ([id number]) -------------------------------------------------------------------------------- Wound Assessment Details Patient Name: Heather Dear A. Date of Service: 09/01/2021 10:45 AM Medical Record Number: [id number] Patient Account Number: [id number] Date of Birth/Sex: 1976-03-29 (45 y.o. F) Treating RN: Cornell Barman Primary Care Ulah Olmo: Raelene Bott Other Clinician: Referring Nehal Witting: Raelene Bott Treating Tytus Strahle/Extender: Yaakov Guthrie in Treatment: 23 Wound Status Wound Number: 7 Primary Diabetic Wound/Ulcer of the Lower Extremity Etiology: Wound Location: Left, Midline, Plantar Foot Wound Open Wounding Event: Gradually Appeared Status: Date Acquired: 08/07/2020 Comorbid Chronic sinus problems/congestion, Middle ear problems, Weeks Of Treatment: 23 History: Anemia, Chronic Obstructive Pulmonary Disease (COPD), Clustered Wound: No Congestive Heart Failure, Type II Diabetes, End Stage Renal Disease, History of pressure wounds, Neuropathy Photos Wound Measurements Length: (cm) 6 Width: (cm) 3.5 Depth: (cm) 0.9 Area: (cm) 16.493 Volume: (cm) 14.844 % Reduction in Area: -517.7% % Reduction in Volume: -1011.9% Epithelialization: None Tunneling: No Undermining: No Wound Description Classification: Grade 2 Wound Margin: Thickened Exudate Amount: Medium Exudate Type: Serosanguineous Exudate Color: red, brown Foul Odor After Cleansing: No Slough/Fibrino Yes Wound Bed Granulation Amount: Small (1-33%) Exposed Structure Granulation Quality: Red, Pink, Hyper-granulation Fascia Exposed: No Necrotic Amount: Large (67-100%) Fat Layer (Subcutaneous Tissue) Exposed: Yes Necrotic Quality: Eschar, Adherent Slough Tendon Exposed: No Muscle  Exposed: No Joint Exposed: No Bone Exposed: No Treatment Notes Wound #7 (Foot) Wound Laterality: Plantar, Left, Midline Cleanser Soap and Water Discharge Instruction: Gently cleanse wound with antibacterial soap, rinse and pat dry prior to dressing wounds Spaugh, [name] ([id number]) Peri-Wound Care Topical Primary Dressing Hydrofera Blue Ready Transfer Foam, 4x5 (in/in) Discharge Instruction: Apply Hydrofera Blue Ready to wound  bed as directed Secondary Dressing Secured With Kerlix Roll Sterile or Non-Sterile 6-ply 4.5x4 (yd/yd) Discharge Instruction: Apply Kerlix as directed Compression Wrap Compression Stockings Add-Ons Electronic Signature(s) Signed: 09/01/2021 5:42:47 PM By: Gretta Cool, BSN, RN, CWS, [name] RN, BSN Entered By: Gretta Cool, BSN, RN, CWS, [name] on 09/01/2021 11:26:50 [name] ([id number]) -------------------------------------------------------------------------------- Wound Assessment Details Patient Name: Heather Dear A. Date of Service: 09/01/2021 10:45 AM Medical Record Number: [id number] Patient Account Number: [id number] Date of Birth/Sex: [birth year] (45 y.o. F) Treating RN: Cornell Barman Primary Care Militza Devery: Raelene Bott Other Clinician: Referring Ulas Zuercher: Raelene Bott Treating Loucinda Croy/Extender: Yaakov Guthrie in Treatment: 23 Wound Status Wound Number: 8 Primary Diabetic Wound/Ulcer of the Lower Extremity Etiology: Wound Location: Right, Anterior Lower Leg Wound Open Wounding Event: Gradually Appeared Status: Date Acquired: 06/30/2021 Comorbid Chronic sinus problems/congestion, Middle ear problems, Weeks Of Treatment: 9 History: Anemia, Chronic Obstructive Pulmonary Disease (COPD), Clustered Wound: No Congestive Heart Failure, Type II Diabetes, End Stage Renal Disease, History of pressure wounds, Neuropathy Photos Wound Measurements Length: (cm) 2.3 Width: (cm) 2 Depth: (cm) 0.2 Area: (cm) 3.613 Volume: (cm) 0.723 % Reduction in  Area: 42.5% % Reduction in Volume: -15.1% Epithelialization: None Tunneling: No Wound Description Classification: Grade 1 Wound Margin: Flat and Intact Exudate Amount: Medium Exudate Type: Serosanguineous Exudate Color: red, brown Foul Odor After Cleansing: No Slough/Fibrino Yes Wound Bed Granulation Amount: Medium (34-66%) Exposed Structure Granulation Quality: Red, Pink Fascia Exposed: No Necrotic Amount: Medium (34-66%) Fat Layer (Subcutaneous Tissue) Exposed: Yes Necrotic Quality: Adherent Slough Tendon Exposed: No Muscle Exposed: No Joint Exposed: No Bone Exposed: No Treatment Notes Wound #8 (Lower Leg) Wound Laterality: Right, Anterior Cleanser Soap and Water Discharge Instruction: Gently cleanse wound with antibacterial soap, rinse and pat dry prior to dressing wounds [name], [name] ([id number]) Peri-Wound Care Topical Primary Dressing Hydrofera Blue Ready Transfer Foam, 4x5 (in/in) Discharge Instruction: Apply Hydrofera Blue Ready to wound bed as directed Secondary Dressing Secured With Kerlix Roll Sterile or Non-Sterile 6-ply 4.5x4 (yd/yd) Discharge Instruction: Apply Kerlix as directed Compression Wrap Compression Stockings Add-Ons Electronic Signature(s) Signed: 09/01/2021 5:42:47 PM By: Gretta Cool, BSN, RN, CWS, [name] RN, BSN Entered By: Gretta Cool, BSN, RN, CWS, [name] on 09/01/2021 11:28:03 [name] ([id number]) -------------------------------------------------------------------------------- Wound Assessment Details Patient Name: Heather Dear A. Date of Service: 09/01/2021 10:45 AM Medical Record Number: [id number] Patient Account Number: [id number] Date of Birth/Sex: November 20, 1975 (45 y.o. F) Treating RN: Cornell Barman Primary Care Hetal Proano: Raelene Bott Other Clinician: Referring Latrish Mogel: Raelene Bott Treating Manju Kulkarni/Extender: Yaakov Guthrie in Treatment: 23 Wound Status Wound Number: 9 Primary Diabetic Wound/Ulcer of the Lower  Extremity Etiology: Wound Location: Right Calcaneus Wound Open Wounding Event: Gradually Appeared Status: Date Acquired: 07/18/2021 Comorbid Chronic sinus problems/congestion, Middle ear problems, Weeks Of Treatment: 6 History: Anemia, Chronic Obstructive Pulmonary Disease (COPD), Clustered Wound: No Congestive Heart Failure, Type II Diabetes, End Stage Renal Disease, History of pressure wounds, Neuropathy Photos Wound Measurements Length: (cm) 0.5 % Reduc Width: (cm) 0.5 % Reduc Depth: (cm) 0.4 Epithel Area: (cm) 0.196 Underm Volume: (cm) 0.079 Sta Endi Maxi tion in Area: 40.6% tion in Volume: 65.8% ialization: Large (67-100%) ining: Yes rting Position (o'clock): 12 ng Position (o'clock): 12 mum Distance: (cm) 0.5 Wound Description Classification: Grade 1 Foul Od Wound Margin: Flat and Intact Slough/ Exudate Amount: Medium Exudate Type: Serosanguineous Exudate Color: red, brown or After Cleansing: No Fibrino Yes Wound Bed Granulation Amount: Medium (34-66%) Exposed Structure Necrotic Amount: Medium (34-66%) Fascia Exposed: No Necrotic Quality: Adherent Slough Fat Layer (  Subcutaneous Tissue) Exposed: Yes Tendon Exposed: No Muscle Exposed: No Joint Exposed: No Bone Exposed: No Treatment Notes Wound #9 (Calcaneus) Wound Laterality: Right Cleanser [name], [name] ([id number]) Soap and Water Discharge Instruction: Gently cleanse wound with antibacterial soap, rinse and pat dry prior to dressing wounds Peri-Wound Care Topical Primary Dressing Hydrofera Blue Ready Transfer Foam, 4x5 (in/in) Discharge Instruction: Apply Hydrofera Blue Ready to wound bed as directed Secondary Dressing Secured With Kerlix Roll Sterile or Non-Sterile 6-ply 4.5x4 (yd/yd) Discharge Instruction: Apply Kerlix as directed Compression Wrap Compression Stockings Add-Ons Electronic Signature(s) Signed: 09/01/2021 5:42:47 PM By: Gretta Cool, BSN, RN, CWS, [name] RN, BSN Entered By: Gretta Cool, BSN,  RN, CWS, [name] on 09/01/2021 11:29:40 [name] ([id number]) -------------------------------------------------------------------------------- Vit[place] Details Patient Name: Heather Dear A. Date of Service: 09/01/2021 10:45 AM Medical Record Number: [id number] Patient Account Number: [id number] Date of Birth/Sex: 10/29/1975 (45 y.o. F) Treating RN: Cornell Barman Primary Care Afsheen Antony: Raelene Bott Other Clinician: Referring Jazzmine Kleiman: Raelene Bott Treating Canio Winokur/Extender: Yaakov Guthrie in Treatment: 23 Vital Signs Time Taken: 11:16 Temperature (F): 98.5 Height (in): 69 Pulse (bpm): 83 Weight (lbs): 185 Respiratory Rate (breaths/min): 18 Body Mass Index (BMI): 27.3 Blood Pressure (mmHg): 108/73 Reference Range: 80 - 120 mg / dl Electronic Signature(s) Signed: 09/01/2021 5:42:47 PM By: Gretta Cool, BSN, RN, CWS, [name] RN, BSN Entered By: Gretta Cool, BSN, RN, CWS, [name] on 09/01/2021 11:18:12

## 2021-09-08 ENCOUNTER — Encounter (HOSPITAL_BASED_OUTPATIENT_CLINIC_OR_DEPARTMENT_OTHER): Payer: Medicaid Other | Admitting: Internal Medicine

## 2021-09-08 ENCOUNTER — Other Ambulatory Visit: Payer: Self-pay

## 2021-09-08 DIAGNOSIS — L97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ity: Secondary | ICD-10-CM

## 2021-09-08 DIAGNOSIS — E11621 Type 2 diabetes mellitus with foot ulcer: Secondary | ICD-10-CM | POA: Diagnosis not present

## 2021-09-08 NOTE — Progress Notes (Signed)
YMANI, PORCHER ([id number]) Visit Report for 09/08/2021 Chief Complaint Document Details Patient Name: [name], [name] Date of Service: 09/08/2021 3:30 PM Medical Record Number: [id number] Patient Account Number: [id number] Date of Birth/Sex: 25-Mar-1976 (45 y.o. F) Treating RN: Cornell Barman Primary Care Provider: Raelene Bott Other Clinician: Referring Provider: Raelene Bott Treating Provider/Extender: Yaakov Guthrie in Treatment: 24 Information Obtained from: Patient Chief Complaint 03/19/2021; patient referred by Dr. Luana Shu who has been looking after her left foot for quite a period of time for review of a nonhealing area in the left midfoot Electronic Signature(s) Signed: 09/08/2021 5:00:18 PM By: Kalman Shan DO Entered By: Kalman Shan on 09/08/2021 [employer], [name] ([id number]) -------------------------------------------------------------------------------- Debridement Details Patient Name: Heather Dear A. Date of Service: 09/08/2021 3:30 PM Medical Record Number: [id number] Patient Account Number: [id number] Date of Birth/Sex: 01-Feb-1976 (45 y.o. F) Treating RN: Cornell Barman Primary Care Provider: Raelene Bott Other Clinician: Referring Provider: Raelene Bott Treating Provider/Extender: Yaakov Guthrie in Treatment: 24 Debridement Performed for Wound #7 Left,Midline,Plantar Foot Assessment: Performed By: Physician Kalman Shan, MD Debridement Type: Debridement Severity of Tissue Pre Debridement: Fat layer exposed Level of Consciousness (Pre- Awake and Alert procedure): Pre-procedure Verification/Time Out Yes - 16:26 Taken: Total Area Debrided (L x W): 4 (cm) x 3 (cm) = 12 (cm) Tissue and other material Viable, Non-Viable, Slough, Subcutaneous, Skin: Dermis , Skin: Epidermis, Slough debrided: Level: Skin/Subcutaneous Tissue Debridement Description: Excisional Instrument: Curette Bleeding: Minimum Hemostasis Achieved:  Pressure Response to Treatment: Procedure was tolerated well Level of Consciousness (Post- Awake and Alert procedure): Post Debridement Measurements of Total Wound Length: (cm) 6.4 Width: (cm) 4 Depth: (cm) 0.5 Volume: (cm) 10.053 Character of Wound/Ulcer Post Debridement: Stable Severity of Tissue Post Debridement: Fat layer exposed Post Procedure Diagnosis Same as Pre-procedure Electronic Signature(s) Signed: 09/08/2021 4:46:05 PM By: Kalman Shan DO Signed: 09/08/2021 4:58:16 PM By: Gretta Cool, BSN, RN, CWS, [name] RN, BSN Entered By: Gretta Cool, BSN, RN, CWS, [name] on 09/08/2021 16:27:52 [name], [name] ([id number]) -------------------------------------------------------------------------------- HPI Details Patient Name: Heather Dear A. Date of Service: 09/08/2021 3:30 PM Medical Record Number: [id number] Patient Account Number: [id number] Date of Birth/Sex: Dec 11, 1975 (45 y.o. F) Treating RN: Cornell Barman Primary Care Provider: Raelene Bott Other Clinician: Referring Provider: Raelene Bott Treating Provider/Extender: Yaakov Guthrie in Treatment: 24 History of Present Illness HPI Description: 01/18/18-She is here for initial evaluation of the left great toe ulcer. She is a poor historian in regards to timeframe in detail. She states approximately 4 weeks ago she lacerated her toe on something in the house. She followed up with her primary care who placed her on Bactrim and ultimately a second dose of Bactrim prior to coming to wound clinic. She states she has been treating the toe with peroxide, Betadine and a Band-Aid. She did not check her blood sugar this morning but checked it yesterday morning it was 327; she is unaware of a recent A1c and there are no current records. She saw Dr. she would've orthopedics last week for an old injury to the left ankle, she states he did not see her toe, nor did she bring it to his attention. She smokes approximately 1 pack cigarettes a  day. Her social situation is concerning, she arrives this morning with her mother who appears extremely intoxicated/under the influence; her mother was asked to leave the room and be monitored by the patient's grandmother. The patient's aunt then accompanied the patient and the room throughout the rest of the appointment. We had a  lengthy discussion regarding the deleterious effects of uncontrolled hyperglycemia and smoking as it relates to wound healing and overall health. She was strongly encouraged to decrease her smoking and get her diabetes under better control. She states she is currently on a diet and has cut down her [facility] consumption. The left toe is erythematous, macerated and slightly edematous with malodor present. The edema in her left foot is below her baseline, there is no erythema streaking. We will treat her with Santyl, doxycycline; we have ordered and xray, culture and provided a Peg assist surgical shoe and cultured the wound. 01/25/18-She is here in follow-up evaluation for a left great toe ulcer and presents with an abscess to her suprapubic area. She states her blood sugars remain elevated, feeling "sick" and if levels are below 250, but she is trying. She has made no attempt to decrease her smoking stating that we "can't take away her food in her cigarettes". She has been compliant with offloading using the PEG assist you. She is using Santyl daily. the culture obtained last week grew staph aureus and Enterococcus faecalis; continues on the doxycycline and Augmentin was added on Monday. The suprapubic area has erythema, no femoral variation, purple discoloration, minimal induration, was accessed with a cotton tip applicator with sanguinopurulent drainage, this was cultured, I suspect the current antibiotic treatment will cover and we will not add anything to her current treatment plan. She was advised to go to urgent care or ER with any change in redness, induration or  fever. 02/01/18-She is here in follow-up evaluation for left great toe ulcers and a new abdominal abscess from last week. She was able to use packing until earlier this week, where she "forgot it was there". She states she was feeling ill with GI symptoms last week and was not taking her antibiotic. She states her glucose levels have been predominantly less than 200, with occasional levels between 200-250. She thinks this was contributing to her GI symptoms as they have resolved without intervention. There continues to be significant laceration to left toe, otherwise it clinically looks stable/improved. There is now less superficial opening to the lateral aspect of the great toe that was residual blister. We will transition to [facility] to all wounds, she will continue her Augmentin. If there is no change or deterioration next week for reculture. 02/08/18-She is here in follow-up evaluation for left great toe ulcer and abdominal ulcer. There is an improvement in both wounds. She has been wrapping her left toe with coban, not by our direction, which has created an area of discoloration to the medial aspect; she has been advised to NOT use coban secondary to her neuropathy. She states her glucose levels have been high over this last week ranging from 200-350, she continues to smoke. She admits to being less compliant with her offloading shoe. We will continue with same treatment plan and she will follow-up next week. 02/15/18-She is here in follow-up evaluation for left great toe ulcer and abdominal ulcer. The abdominal ulcer is epithelialized. The left great toe ulcer is improved and all injury from last week using the Coban wrap is resolved, the lateral ulcer is healed. She admits to noncompliance with wearing offloading shoe and admits to glucose levels being greater than 300 most of the week. She continues to smoke and expresses no desire to quit. There is one area medially that probes deeper than  it has historically, erythema to the toe and dorsal foot has consistently waxed and waned.  There is no overt signs of cellulitis or infection but we will culture the wound for any occult infection given the new area of depth and erythema. We will hold off on sensitivities for initiation of antibiotic therapy. 02/22/18-She is here in follow up evaluation for left great toe ulcer. There is overall significant improvement in both wound appearance, erythema and edema with changes made last week. She was not initiated on antibiotic therapy. Culture obtained last week showed oxacillin sensitive staph aureus, sensitive to clindamycin. Clindamycin has been called into the pharmacy but she has been instructed to hold off on initiation secondary to overall clinical improvement and her history of antibiotic intolerance. She has been instructed to contact the clinic with any noted changes/deterioration and the wound, erythema, edema and/or pain. She will follow-up next week. She continues to smoke and her glucose levels remain elevated >250; she admits to compliance with offloading shoe 03/01/18 on evaluation today patient appears to be doing fairly well in regard to her left first toe ulcer. She has been tolerating the dressing changes with the [facility] Dressing without complication and overall this has definitely showed signs of improvement according to records as well is what the patient tells me today. I'm very pleased in that regard. She is having no pain today 03/08/18 She is here for follow up evaluation of a left great toe ulcer. She remains non-compliant with glucose control and smoking cessation; glucose levels consistently >200. She states that she got new shoe inserts/peg assist. She admits to compliance with offloading. Since my last evaluation there is significant improvement. We will switch to prisma at this time and she will follow up next week. She is noted to be tachycardic at this appointment,  heart rate 120s; she has a history of heart rate 70-130 according to our records. She admits to extreme agitation r/t personal issues; she was advised to monitor her heartrate and contact her physician if it does not return to a more normal range (<100). She takes cardizem twice daily. 03/15/18-She is here in follow-up evaluation for left great toe ulcer. She remains noncompliant with glucose control and smoking cessation. She admits to compliance with wearing offloading shoe. The ulcer is improved/stable and we will continue with the same treatment plan and she will follow-up next week 03/22/18-She is here for evaluation for left great toe ulcer. There continues to be significant improvement despite recurrent hyperglycemia (over 500 yesterday) and she continues to smoke. She has been compliant with offloading and we will continue with same treatment plan and she will follow-up next week. 03/29/18-She is here for evaluation for left great toe ulcer. Despite continuing to smoke and uncontrolled diabetes she continues to improve. She is compliant with offloading shoe. We will continue with the same treatment plan and she will follow-up next week 04/05/18- She is here in follow up evaluation for a left great toe ulcer; she presents with small pustule to left fifth toe (resembles ant bite). She admits to compliance with wearing offloading shoe; continues to smoke or have uncontrolled blood glucose control. There is more callus than usual with evidence of bleeding; she denies known trauma. 04/12/18-She is here for evaluation of left great toe ulcer. Despite noncompliance with glycemic control and smoking she continues to make [name], [name] ([id number]) improvement. She continues to wear offloading shoe. The pustule, that was identified last week, to the left fifth toe is resolved. She will follow-up in 2 weeks 05/03/18-she is seen in follow-up evaluation for a left  great toe ulcer. She is compliant with  offloading, otherwise noncompliant with glycemic control and smoking. She has plateaued and there is minimal improvement noted. We will transition to [facility], replaced the insert to her surgical shoe and she will follow-up in one week 05/10/18- She is here in follow up evaluation for a left great toe ulcer. It appears stable despite measurement change. We will continue with same treatment plan and follow up next week. 05/24/18-She is seen in follow-up evaluation for a left great toe ulcer. She remains compliant with offloading, has made significant improvement in her diet, decreasing the amount of sugar/soda. She said her recent A1c was 10.9 which is lower than. She did see a diabetic nutritionist/educator yesterday. She continues to smoke. We will continue with the same treatment plan and she'll follow-up next week. 05/31/18- She is seen in follow-up evaluation for left great toe ulcer. She continues to remain compliant with offloading, continues to make improvement in her diet, increasing her water and decreasing the amount of sugar/soda. She does continue to smoke with no desire to quit. We will apply Prisma to the depth and Hydrofera Blue over. We have not received insurance authorization for oasis. She will follow up next week. 06/07/18-She is seen in follow-up evaluation for left great toe ulcer. It has stalled according to today's measurements although base appears stable. She says she saw a diabetic educator yesterday; her average blood sugars are less than 300 which is an improvement for her. She continues to smoke and states "that's my next step" She continues with water over soda. We will order for xray, culture and reinstate ace wrap compression prior to placing apligraf for next week. She is voicing no complaints or concerns. Her dressing will change to iodoflex over the next week in preparation for apligraf. 06/14/18-She is seen in follow-up evaluation for left great toe ulcer. Plain  film x-ray performed last week was negative for osteomyelitis. Wound culture obtained last week grew strep B and OSSA; she is initiated on keflex and cefdinir today; there is erythema to the toe which could be from ace wrap compression, she has a history of wrapping too tight and has has been encouraged to maintain ace wraps that we place today. We will hold off on application of apligraf today, will apply next week after antibiotic therapy has been initiated. She admits today that she has resumed taking a shower with her foot/toe submerged in water, she has been reminded to keep foot/toe out of the bath water. She will be seen in follow up next week 06/21/18-she is seen in follow-up evaluation for left great toe ulcer. She is tolerating antibiotic therapy with no GI disturbance. The wound is stable. Apligraf was applied today. She has been decreasing her smoking, only had 4 cigarettes yesterday and 1 today. She continues being more compliant in diabetic diet. She will follow-up next week for evaluation of site, if stable will remove at 2 weeks. 06/28/18- She is here in follow up evalution. Apligraf was placed last week, she states the dressing fell off on Tuesday and she was dressing with hydrofera blue. She is healed and will be discharged from the clinic today. She has been instructed to continue with smoking cessation, continue monitoring glucose levels, offloading for an additional 4 weeks and continue with hydrofera blue for additional two weeks for any possible microscopic opening. Readmission: 08/07/18 on evaluation today patient presents for reevaluation concerning the ulcer of her right great toe. She was previously discharged on  06/28/18 healed. Nonetheless she states that this began to show signs of drainage she subsequently went to her primary care provider. Subsequently an x-ray was performed on 08/01/18 which was negative. The patient was also placed on antibiotics at that time. Fortunately  they should have been effective for the infection. Nonetheless she's been experiencing some improvement but still has a lot of drainage coming from the wound itself. 08/14/18 on evaluation today patient's wound actually does show signs of improvement in regard to the erythema at this point. She has completed the antibiotics. With that being said we did discuss the possibility of placing her in a total contact cast as of today although I think that I may want to give this just a little bit more time to ensure nothing recurrence as far as her infection is concerned. I do not want to put in the cast and risk infection at that time if things are not completely resolved. With that being said she is gonna require some debridement today. 08/21/18 on evaluation today patient actually appears to be doing okay in regard to her toe ulcer. She's been tolerating the dressing changes without complication. With that being said it does appear that she is ready and in fact I think it's appropriate for [place] to go ahead and initiate the total contact cast today. Nonetheless she will require some sharp debridement to prepare the wound for application. Overall I feel like things have been progressing well but we do need to do something to get this to close more readily. 08/24/18 patient seen today for reevaluation after having had the total contact cast applied on Tuesday. She seems to have done very well the wound appears to be doing great and overall I'm pleased with the progress that she's made. There were no abnormal areas of rubbing from the cast on her lower extremity. 08/30/18 on evaluation today patient actually appears to be completely healed in regard to her plantar toe ulcer. She tells me at this point she's been having a lot of issues with the cast. She almost fell a couple of times the state shall the step of her dog a couple times as well. This is been a very frustrating process for her other nonetheless she  has completely healed the wound which is excellent news. Overall there does not appear to be the evidence of infection at this time which is great news. 09/11/18 evaluation today patient presents for follow-up concerning her great toe ulcer on the left which has unfortunately reopened since I last saw her which was only a couple of weeks ago. Unfortunately she was not able to get in to get the shoe and potentially the AFO that's gonna be necessary due to her left foot drop. She continues with offloading shoe but this is not enough to prevent her from reopening it appears. When we last had her in the total contact cast she did well from a healing standpoint but unfortunately the wound reopened as soon as she came out of the cast within just a couple of weeks. 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 This is also part of the reason why she falls on a regular basis. I just do not believe that is gonna be safe for [place] to reinitiate the total  contact cast as last time we had this on she fell 3 times one week which is definitely not normal for her. 09/18/18 upon evaluation today the patient actually appears to be doing about the same in regard to her toe ulcer. She did not contact [facility] as I asked her to even though I had given her the prescription. In fact she actually states that she has no idea where the prescription is. She did apparently call [facility] and they told her that all she needed to do was bring the prescription in order to be able to be seen and work on getting the AFO for her left foot. With all that being said she still does not have an appointment and I'm not sure were things stand that regard. I will give her a new prescription today in order to contact them to get this set up. 09/25/18 on  evaluation today patient actually appears to be doing about the same in regard to her toes ulcer. She does have a small areas which seems to have a lot of callous buildup around the edge of the wound which is going to need sharp debridement today. She still is waiting to be scheduled for evaluation with [facility] for possibility of an AFO. She states there supposed to call her tomorrow to get this set up. Unfortunately it does appear that her foot specifically the toe area is showing signs of erythema. There does not appear to be any systemic infection which is in these good news. VI, BIDDINGER A. ([id number]) 10/02/18 on evaluation today patient actually appears to be doing about the same in regard to her toe ulcer. This really has not done too well although it's not significantly larger it's also not significantly smaller. She has been tolerating the dressing changes without complication. She actually has her appointment with [facility] and [facility] tomorrow to hopefully be measured for obtaining and AFO splint. I think this would be helpful preventing this from reoccurring. We had contemplated starting the cast this week although to be honest I am reluctant to do that as she's been having nausea, vomiting, and seizure activity over the past three days. She has a history of seizures and have been told is nothing that can be done for these. With that being said I do believe that along with the seizures have the nausea vomiting which upon further questioning doesn't seem to be the normal for her and makes me concerned for the possibility of infection or something else going on. I discussed this with the patient and her mother during the office visit today. I do not feel the wound is effective but maybe something else. The responses this was "this just happens to her at times and we don't know why". They did not seem to be interested in going to the hospital to have this checked out further. 10/09/18 on  evaluation today patient presents for follow-up concerning her ongoing toe ulcer. She has been tolerating the dressing changes without complication. Fortunately there does not appear to be any evidence of infection which is great news however I do think that the patient would benefit from going ahead for with the total contact cast. She's actually in a wheelchair today she tells me that she will use her walker if we initiate the cast. I was very specific about the fact that if we were gonna do the cast I wanted to make sure that she was using the walker in order to prevent any falls. She tells me  she does not have stairs that she has to traverse on a regular basis at her home. She has not had any seizures since last week again that something that happens to her often she tells me she did talk to [employer] and they said that it may take up to three weeks to get the brace approved for her. Hopefully that will not take that long but nonetheless in the meantime I do think the cast could be of benefit. 10/12/18 on evaluation today patient appears to be doing rather well in regard to her toe ulcer. It's just been a few days and already this is significantly improved both as far as overall appearance and size. Fortunately there's no sign of infection. She is here for her first obligatory cast change. 10/19/18 Seen today for follow up and management of left great toe ulcer. Wound continues to show improvement. Noted small open area with seroussang drainage with palpation. Denies any increased pain or recent fevers during visit. She will continue calcium alginate with offloading shoe. Denies any questions or concerns during visit. 10/26/18 on evaluation today patient appears to be doing about the same as when I last saw her in regard to her wound bed. Fortunately there does not appear to be any signs of infection. Unfortunately she continues to have a breakdown in regard to the toe region any time that she is not  in the cast. It takes almost no time at all for this to happen. Nonetheless she still has not heard anything from the brace being made by [employer] as to when exactly this will be available to her. Fortunately there is no signs of infection at this time. 10/30/18 on evaluation today patient presents for application of the total contact cast as we just received him this morning. Fortunately we are gonna be able to apply this to her today which is great news. She continues to have no significant pain which is good news. Overall I do feel like things have been improving while she was the cast is when she doesn't have a cast that things get worse. She still has not really heard anything from [place] regarding her brace. 11/02/18 upon evaluation today patient's wound already appears to be doing significantly better which is good news. Fortunately there does not appear to be any signs of infection also good news. Overall I do think the total contact cast as before is helping to heal this area unfortunately it's just not gonna likely keep the area closed and healed without her getting her brace at least. Again the foot drop is a significant issue for her. 11/09/18 on evaluation today patient appears to be doing excellent in regard to her toe ulcer which in fact is completely healed. Fortunately we finally got the situation squared away with the paperwork which was needed to proceed with getting her brace approved by Medicaid. I have filled that out unfortunately that information has been sent to the orthopedic office that I worked at 2 1/2 years ago and not tired Current wound care measures. Fortunately she seems to be doing very well at this time. 11/23/18 on evaluation today patient appears to be doing More Poorly Compared to Last Time I Saw Her. At [facility] She Had Completely Healed. Currently she is continuing to have issues with reopening. She states that she just found out that the brace was approved  through Medicaid now she just has to go get measured in order to have this fitted for her and then  made. Subsequently she does not have an appointment for this yet that is going to complicate things we obviously cannot put her back in the cast if we do not have everything measured because they're not gonna be able to measure her foot while she is in the cast. 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 Obviously this is unfortunate and does have me somewhat worried as well. 11/30/18 on evaluation today patient's toe ulcer actually appears to be doing fairly well. The good news is she will be getting her brace in the shoes next week on Wednesday. Hopefully we will be able to get this to heal without having to go back in the cast however she may need the cast in order to get the wound completely heal and then go from there. Fortunately there's no signs of infection at this time. 12/07/18 on evaluation today patient fortunately did receive her brace and she states she could tell this definitely makes her walk better. With that being said she's been having issues with her toe where she noticed yesterday there was a lot of tissue that was loosing off this appears to be much larger than what it was previous. She also states that her leg has been read putting much across the top of her foot just about the ankle although this seems to be receiving somewhat. The total area is still red and appears to be someone infected as best I can tell. She is previously taken Bactrim and that may be a good option for her today as well. We are gonna see what I wound culture shows as well and I think that this is definitely appropriate. With that being said outside of the culture I still need to initiate something in the interim and that's what I'm gonna go ahead and select Bactrim is a good option for her. 12/14/18 on evaluation  today patient appears to be doing better in regard to her left great toe ulcer as compared to last week's evaluation. There's still some erythema although this is significantly improved which is excellent news. Overall I do believe that she is making good progress is still gonna take some time before she is where I would like her to be from the standpoint of being able to place her back into the total contact cast. Hopefully we will be where we need to be by next week. 12/21/18 on evaluation today patient actually appears to be doing poorly in regard to her toe ulcer. She's been tolerating the dressing changes without complication. Fortunately there's no signs of systemic infection although she does have a lot of drainage from the toe ulcer and this does seem to be causing some issues at this point. She does have erythema on the distal portion of her toe that appears to be likely cellulitis. 12/28/18 on evaluation today patient actually appears to be doing a little better in my pinion in regard to her toe ulcer. With that being said she still does have some evidence of infection at this time and for her culture she had both E. coli as well as enterococcus as organisms noted on evaluation. For that reason I think that though the Keflex likely has treated the E. coli rather well this has really done nothing for the enterococcus. We are going to have to initiate treatment for this specifically. [name], [name] ([id number]) 01/04/19 on evaluation today  patient's toe actually appears to be doing better from the standpoint of infection. She currently would like to see about putting the cash back on I think that this is appropriate as long as she takes care of it and keeps it from getting wet. She is gonna have some drainage we can definitely pass this up with Drawtex and alginate to try to prevent as much drainage as possible from causing the problems. With that being said I do want to at least try her with  the cast between now and Tuesday. If there any issues we can't continue to use it then I will discontinue the use of the cast at that point. 01/08/19 on evaluation today patient actually appears to be doing very well as far as her foot ulcer specifically the great toe on the left is concerned. She did have an area of rubbing on the medial aspect of her left ankle which again is from the cast. Fortunately there's no signs of infection at this point in this appears to be a very slight skin breakdown. The patient tells me she felt it rubbing but didn't think it was that bad. Fortunately there is no signs of active infection at this time which is good news. No fevers, chills, nausea, or vomiting noted at this time. 01/15/19 on evaluation today patient actually appears to be doing well in regard to her toe ulcer. Again as previous she seems to do well and she has the cast on which indicates to me that during the time she doesn't have a cast on she's putting way too much pressure on this region. Obviously I think that's gonna be an issue as with the current national emergency concerning the Covid-19 Virus it has been recommended that we discontinue the use of total contact casting by the chief medical officer of our company, Dr. Simona Huh. The reasoning is that if a patient becomes sick and cannot come into have the cast removed they could not just leave this on for an additional two weeks. Obviously the hospitals also do not want to receive patient's who are sick into the emergency department to potentially contaminate the region and spread the Covid-19 Virus among other sick individuals within the hospital system. Therefore at this point we are suspending the use of total contact cast until the current emergency subsides. This was all discussed with the patient today as well. 01/22/19 on evaluation today patient's wound on her left great toe appears to be doing slightly worse than previously noted last week. She  tells me that she has been on this quite a bit in fact she tells me she's been awake for 38 straight hours. This is due to the fact that she's having to care for grandparents because nobody else will. She has been taking care of them for five the last seven days since I've seen her they both have dementia his is from a stroke and her grandmother's was progressive. Nonetheless she states even her mom who knows her condition and situation has only help two of those days to take care of them she's been taking care of the rest. Fortunately there does not appear to be any signs of active infection in regard to her toe at this point although obviously it doesn't look as good as it did previous. I think this is directly related to her not taking off the pressure and friction by way of taking things easy. Though I completely understand what's going on. 01/29/19 on evaluation today patient's  tools are actually appears to be showing some signs of improvement today compared to last week's evaluation as far as not necessarily the overall size of the wound but the fact that she has some new skin growth in between the two ends of the wound opening. Overall I feel like she has done well she states that she had a family member give her what sounds to be a CAM walker boot which has been helpful as well. 02/05/19 on evaluation today patient's wound bed actually appears to be doing significantly better in regard to her overall appearance of the size of the wound. With that being said she is still having an issue with offloading efficiently enough to get this to close. Apparently there is some signs of infection at this point as well unfortunately. Previously she's done well of Augmentin I really do not see anything that needs to be culture currently but there theme and cellulitis of the foot that I'm seeing I'm gonna go ahead and place her on an antibiotic today to try to help clear this up. 02/12/2019 on evaluation today  patient actually appears to be doing poorly in regard to her overall wound status. She tells me she has been using her offloading shoe but actually comes in today wearing her tennis shoe with the AFO brace. Again as I previously discussed with her this is really not sufficient to allow the area to heal appropriately. Nonetheless she continues to be somewhat noncompliant and I do wonder based on what she has told my nurse in the past as to whether or not a good portion of this noncompliance may be recreational drug and alcohol related. She has had a history of heroin overdose and this was fairly recently in the past couple of months that have been seeing her. Nonetheless overall I feel like her wound looks significantly worse today compared to what it was previous. She still has significant erythema despite the Augmentin I am not sure that this is an appropriate medication for her infection I am also concerned that the infection may have gone down into her bone. 02/19/19 on evaluation today patient actually appears to be doing about the same in regard to her toe ulcer. Unfortunately she continues to show signs of bone exposure and infection at this point. There does not appear to be any evidence of worsening of the infection but I'm also not really sure that it's getting significantly better. She is on the Augmentin which should be sufficient for the Staphylococcus aureus infection that she has at this point. With that being said she may need IV antibiotics to more appropriately treat this. We did have a discussion today about hyperbaric option therapy. 02/28/19 on evaluation today patient actually appears to be doing much worse in regard to the wound on her left great toe as compared to even my previous evaluation last week. Unfortunately this seems to be training in a pretty poor direction. Her toe was actually now starting to angle laterally and I can actually see the entire joint area of the proximal  portion of the digit where is the distal portion of the digit again is no longer even in contact with the joint line. Unfortunately there's a lot more necrotic tissue around the edge and the toe appears to be showing signs of becoming gangrenous in my pinion. I'm very concerned about were things stand at this point. She did see infectious disease and they are planning to send in a prescription for Sivextro for  her and apparently this has been approved. With that being said I don't think she should avoid taking this but at the same time I'm not sure that it's gonna be sufficient to save her toe at this point. 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 03/05/19 on evaluation today patient actually appears to be doing quite well in regard to her toe all things considering. She still has bone exposed but there appears to be much less your thing on overall the appearance of the wound and the toe itself is dramatically improved. She still does have some issues currently obviously with infection she did see vascular as well and there concerned that her blood flow to the toad. For that reason they are setting up for an angiogram next week. 03/14/19 on evaluation today patient appears to be doing very poor in regard to her toe and specifically in regard to the ulceration and the fact that she's starting to notice the toe was leaning even more towards the lateral aspect and the complete joint is visible on the proximal aspect of the joint. Nonetheless she's also noted a significant odor and the tip of the toe is turning more dark and necrotic appearing. Overall I think she is getting worse not better as far as this is concerned. For that reason I am recommending at this point that she likely needs to be seen for  likely amputation. READMISSION 03/19/2021 This is a patient that we cared for in this clinic for a prolonged period of time in 2019 and 2020 with a left foot and left first toe wound. 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 ([id number]) 04/09/20 by Dr. Luana Shu. In December 2021 she had an ulcer on her right great toe as well as the fourth and fifth toes. 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 She also had an angiogram at that time and underwent angioplasty of the right anterior tibial artery. In any case she claims that the wound on the right foot is closed I did not look at this today which was probably an oversight although I think that should be done next week. After her surgery she developed a dehiscence but I do not see any follow-up of this. According to Dr. Deborra Medina last review that she was out of the area being cared for by another physician but recently came back to his attention. 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 A culture of this area showed E. coli apparently before she came back to see Dr. Luana Shu she was supposed to be receiving antibiotics but she did not really take them. Nor is she offloading this area at all. Finally her last hemoglobin A1c listed in epic was in March 2022 at 14.1 she says things are a lot better since then although I am not sure. 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 She had this wound at the time. During this admission she had E. coli sepsis a TEE was negative. Past medical history is extensive and includes type 2 diabetes with peripheral neuropathy cardiomyopathy with  an ejection fraction of 33%, hypertension, hyperlipidemia chronic renal failure stage III history of substance abuse with cocaine  although she claims to be clean now verified by her mother. She is still a heavy cigarette smoker. She has a history of bipolar disorder seizure disorder ABI in our clinic was 1.05 6/1; left midfoot in the setting of a TMA done previously. Round circular wound with a "knuckle" of protruding tissue. The problem is that the knuckle was not attached to any of the surrounding granulation and this probed proximally widely I removed a large portion of this tissue. This wound goes with considerable undermining laterally. I do not feel any bone there was no purulence but this is a deep wound. 6/8; in spite of the debridement I did last week. She arrives with a wound looking exactly the same. A protruding "knuckle" of tissue nonadherent to most of the surrounding tissue. There is considerable depth around this from 6-12 o'clock at 2.7 cm and undermining of 1 cm. This does not look overtly infected and the x-ray I did last week was negative for any osseous abnormalities. We have been using silver collagen 6/15; deep tissue culture I did last week showed moderate staph aureus and moderate Pseudomonas. This will definitely require prolonged antibiotic therapy. The pathology on the protuberant area was negative for malignancy fungus etc. 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 We have been using silver collagen. I am going to be prescribing Levaquin for 2 weeks. Her CT scan of the foot is down for 7/5 6/22; CT scan of the foot on 7 5. She says she has hardware in the left leg from her previous fracture. She is on the Levaquin for the deep tissue culture I did that showed methicillin sensitive staph aureus and Pseudomonas. I gave her a 2-week supply and she will have another week. She arrives in clinic today with the same protuberant tissue however this is nonadherent to the tissue surrounding it. I am really at a loss to explain this unless there is  underlying deep tissue infection 6/29; patient presents for 1 week follow-up. She has been using collagen to the wound bed. She reports taking her antibiotics as prescribed.She has no complaints or issues today. She denies signs of infection. 7/6; patient presents for one week followup. She has been using collagen to the wound bed. She states she is taking Levaquin however at times she is not able to keep it down. She denies signs of infection. 7/13; patient presents for 1 week follow-up. She has been using silver alginate to the wound bed. She still has nausea when taking her antibiotics. She denies signs of infection. 7/20; patient presents for 1 week follow-up. She has been using silver alginate with gentamicin cream to the wound bed. She denies any issues and has no complaints today. She denies signs of infection. 7/27; patient presents for 1 week follow-up. She continues to use silver alginate with gentamicin cream to the wound bed. She reports starting her antibiotics. She has no issues or complaints. Overall she reports stability to the wound. 8/3; patient presents for 1 week follow-up. She has been using silver alginate with gentamicin cream to the wound bed. She reports completing all antibiotics. She has no issues or complaints today. She denies signs of infection. 8/17; patient presents for 2-week follow-up. He is to use silver alginate to the wound bed. She has no issues or complaints today. She denies signs of infection. She  reports her pain has improved in her foot since last clinic visit 8/24; patient presents for 1 week follow-up. She continues to use silver alginate to the wound bed. She has no issues or complaints. She denies signs of infection. Pain is stable. 9/7; patient presents for follow-up. She missed her last week appointment due to feeling ill. She continues to use silver alginate. She has a new wound to the right lower extremity that is covered in eschar. She states It  occurred over the past week and has no idea how it started. She currently denies signs of infection. 9/14; patient presents for follow-up. To the left foot wound she has been using gentamicin cream and silver alginate. To the right lower extremity wound she has been keeping this covered and has not obtain Santyl. 9/21; patient presents for follow-up. She reports using gentamicin cream and silver alginate to the left foot and Santyl to the right lower extremity wound. She has no issues or complaints today. She denies signs of infection. 9/28; patient presents for follow-up. She reports a new wound to her right heel. She states this occurred a few days ago and is progressively gotten worse. She has been trying to clean the area with a Q-tip and Santyl. She reports stability in the other 2 wounds. She has been using gentamicin cream and silver alginate to the left foot and Santyl to the right lower extremity wound. 10/12; patient presents for follow-up. She reports improvement to the wound beds. She is seeing vein and vascular to discuss the potential of a left BKA. She states they are going to do an arteriogram. She continues to use silver alginate with dressing changes to her wounds. 11/2; patient presents for follow-up. She states she has not been doing dressing changes to the wound beds. She states she is not able to offload the areas. She reports chronic pain to her left foot wound. 11/9; patient presents for follow-up. She came in with only socks on. She states she forgot to put on shoes. It is unclear if she is doing any dressing changes. She currently denies systemic signs of infection. 11/16; patient presents for follow-up. She came again only with socks on. She states she does not wear shoes ever. It is unclear if she does dressing changes. She currently denies systemic signs of infection. Electronic Signature(s) Signed: 09/08/2021 5:00:18 PM By: Kalman Shan DO Entered By: Kalman Shan on 09/08/2021 16:49:32 [name] ([id number]) -------------------------------------------------------------------------------- Physical Exam Details Patient Name: Heather Dear A. Date of Service: 09/08/2021 3:30 PM Medical Record Number: [id number] Patient Account Number: [id number] Date of Birth/Sex: April 06, 1976 (45 y.o. F) Treating RN: Cornell Barman Primary Care Provider: Raelene Bott Other Clinician: Referring Provider: Raelene Bott Treating Provider/Extender: Yaakov Guthrie in Treatment: 24 Constitutional . Cardiovascular . Psychiatric . Notes Left foot (Transmetatarsal amputation): Wound to the plantar aspect that has granulation tissue (Although tissue does not appear healthy) and nonviable tissue present. Green blue drainage to the periwound. Right lower extremity: To the anterior aspect there is an open wound with Nonviable tissue throughout that easily came off with debridement. Granulation tissue present. Right foot: To the heel there is an open wound with Granulation tissue present. Circumferential callus. Circumferential undermining noted. Electronic Signature(s) Signed: 09/08/2021 5:00:18 PM By: Kalman Shan DO Entered By: Kalman Shan on 09/08/2021 16:50:57 [name] ([id number]) -------------------------------------------------------------------------------- Physician Orders Details Patient Name: Heather Dear A. Date of Service: 09/08/2021 3:30 PM Medical Record Number: [id number] Patient Account Number: [id number] Date of  Birth/Sex: 04-18-1976 (45 y.o. F) Treating RN: Cornell Barman Primary Care Provider: Raelene Bott Other Clinician: Referring Provider: Raelene Bott Treating Provider/Extender: Yaakov Guthrie in Treatment: 74 Verbal / Phone Orders: No Diagnosis Coding Follow-up Appointments o Return Appointment in 1 week. Bathing/ Shower/ Hygiene o Wash wounds with antibacterial soap and  water. Off-Loading Left Lower Extremity o Open toe surgical shoe with peg assist. o Turn and reposition every 2 hours o Other: - peg shoe right Medications-Please add to medication list. o P.O. Antibiotics Wound Treatment Wound #7 - Foot Wound Laterality: Plantar, Left, Midline Cleanser: Dakin 16 (oz) 0.25 1 x Per Day/30 Days Discharge Instructions: Use as directed. Primary Dressing: Gauze 1 x Per Day/30 Days Discharge Instructions: As directed: moistened with Dakins Solution Secured With: Kerlix Roll Sterile or Non-Sterile 6-ply 4.5x4 (yd/yd) 1 x Per Day/30 Days Discharge Instructions: Apply Kerlix as directed Wound #8 - Lower Leg Wound Laterality: Right, Anterior Cleanser: Soap and Water 1 x Per Day/30 Days Discharge Instructions: Gently cleanse wound with antibacterial soap, rinse and pat dry prior to dressing wounds Topical: Bacitracin Ointment, 1 (oz) tube 1 x Per Day/30 Days Secured With: Kerlix Roll Sterile or Non-Sterile 6-ply 4.5x4 (yd/yd) 1 x Per Day/30 Days Discharge Instructions: Apply Kerlix as directed Wound #9 - Calcaneus Wound Laterality: Right Cleanser: Soap and Water 1 x Per Day/30 Days Discharge Instructions: Gently cleanse wound with antibacterial soap, rinse and pat dry prior to dressing wounds Primary Dressing: Hydrofera Blue Ready Transfer Foam, 4x5 (in/in) (Generic) 1 x Per Day/30 Days Discharge Instructions: Apply Hydrofera Blue Ready to wound bed as directed Secured With: coverlet 1 x Per Day/30 Days Patient Medications Allergies: erythromycin base Notifications Medication Indication Start End ciprofloxacin HCl 09/08/2021 DOSE 1 - oral 750 mg tablet - 1 tablet oral BID x 7 days Dakin's Solution 09/08/2021 Erb, Tarri A. ([id number]) Notifications Medication Indication Start End DOSE 1 - miscellaneous 0.125 % solution - wet to dry dressings twice daily Electronic Signature(s) Signed: 09/08/2021 4:59:23 PM By: Kalman Shan DO Previous  Signature: 09/08/2021 4:56:50 PM Version By: Kalman Shan DO Entered By: Kalman Shan on 09/08/2021 16:59:22 [name], [name] ([id number]) -------------------------------------------------------------------------------- Problem List Details Patient Name: Heather Dear A. Date of Service: 09/08/2021 3:30 PM Medical Record Number: [id number] Patient Account Number: [id number] Date of Birth/Sex: 1976/07/25 (45 y.o. 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 03/19/2021 No Yes severity S81.801D Unspecified open wound, right lower leg, subsequent encounter 07/07/2021 No Yes E11.621 Type 2 diabetes mellitus with foot ulcer 03/19/2021 No Yes E11.42 Type 2 diabetes mellitus with diabetic polyneuropathy 03/19/2021 No Yes S91.301A Unspecified open wound, right foot, initial encounter 07/21/2021 No Yes Inactive Problems ICD-10 Code Description Active Date Inactive Date S81.801A Unspecified open wound, right lower leg, initial encounter 06/30/2021 06/30/2021 Resolved Problems Electronic Signature(s) Signed: 09/08/2021 5:00:18 PM By: Kalman Shan DO Entered By: Kalman Shan on 09/08/2021 16:48:23 [name], Gaynel A. ([id number]) -------------------------------------------------------------------------------- Progress Note Details Patient Name: Heather Dear A. Date of Service: 09/08/2021 3:30 PM Medical Record Number: [id number] Patient Account Number: [id number] Date of Birth/Sex: 1976/04/12 (46 y.o. F) Treating RN: Cornell Barman Primary Care Provider: Raelene Bott Other Clinician: Referring Provider: Raelene Bott Treating Provider/Extender: Yaakov Guthrie in Treatment:  24 Subjective Chief Complaint Information obtained from Patient 03/19/2021; patient  referred by Dr. Luana Shu who has been looking after her left foot for quite a period of time for review of a nonhealing area in the left midfoot History of Present Illness (HPI) 01/18/18-She is here for initial evaluation of the left great toe ulcer. She is a poor historian in regards to timeframe in detail. She states approximately 4 weeks ago she lacerated her toe on something in the house. She followed up with her primary care who placed her on Bactrim and ultimately a second dose of Bactrim prior to coming to wound clinic. She states she has been treating the toe with peroxide, Betadine and a Band-Aid. She did not check her blood sugar this morning but checked it yesterday morning it was 327; she is unaware of a recent A1c and there are no current records. She saw Dr. she would've orthopedics last week for an old injury to the left ankle, she states he did not see her toe, nor did she bring it to his attention. She smokes approximately 1 pack cigarettes a day. Her social situation is concerning, she arrives this morning with her mother who appears extremely intoxicated/under the influence; her mother was asked to leave the room and be monitored by the patient's grandmother. The patient's aunt then accompanied the patient and the room throughout the rest of the appointment. We had a lengthy discussion regarding the deleterious effects of uncontrolled hyperglycemia and smoking as it relates to wound healing and overall health. She was strongly encouraged to decrease her smoking and get her diabetes under better control. She states she is currently on a diet and has cut down her [facility] consumption. The left toe is erythematous, macerated and slightly edematous with malodor present. The edema in her left foot is below her baseline, there is no erythema streaking. We will treat her with Santyl, doxycycline; we have  ordered and xray, culture and provided a Peg assist surgical shoe and cultured the wound. 01/25/18-She is here in follow-up evaluation for a left great toe ulcer and presents with an abscess to her suprapubic area. She states her blood sugars remain elevated, feeling "sick" and if levels are below 250, but she is trying. She has made no attempt to decrease her smoking stating that we "can't take away her food in her cigarettes". She has been compliant with offloading using the PEG assist you. She is using Santyl daily. the culture obtained last week grew staph aureus and Enterococcus faecalis; continues on the doxycycline and Augmentin was added on Monday. The suprapubic area has erythema, no femoral variation, purple discoloration, minimal induration, was accessed with a cotton tip applicator with sanguinopurulent drainage, this was cultured, I suspect the current antibiotic treatment will cover and we will not add anything to her current treatment plan. She was advised to go to urgent care or ER with any change in redness, induration or fever. 02/01/18-She is here in follow-up evaluation for left great toe ulcers and a new abdominal abscess from last week. She was able to use packing until earlier this week, where she "forgot it was there". She states she was feeling ill with GI symptoms last week and was not taking her antibiotic. She states her glucose levels have been predominantly less than 200, with occasional levels between 200-250. She thinks this was contributing to her GI symptoms as they have resolved without intervention. There continues to be significant laceration to left toe, otherwise it clinically looks stable/improved. There is now less superficial opening to the lateral  aspect of the great toe that was residual blister. We will transition to [facility] to all wounds, she will continue her Augmentin. If there is no change or deterioration next week for reculture. 02/08/18-She is  here in follow-up evaluation for left great toe ulcer and abdominal ulcer. There is an improvement in both wounds. She has been wrapping her left toe with coban, not by our direction, which has created an area of discoloration to the medial aspect; she has been advised to NOT use coban secondary to her neuropathy. She states her glucose levels have been high over this last week ranging from 200-350, she continues to smoke. She admits to being less compliant with her offloading shoe. We will continue with same treatment plan and she will follow-up next week. 02/15/18-She is here in follow-up evaluation for left great toe ulcer and abdominal ulcer. The abdominal ulcer is epithelialized. The left great toe ulcer is improved and all injury from last week using the Coban wrap is resolved, the lateral ulcer is healed. She admits to noncompliance with wearing offloading shoe and admits to glucose levels being greater than 300 most of the week. She continues to smoke and expresses no desire to quit. There is one area medially that probes deeper than it has historically, erythema to the toe and dorsal foot has consistently waxed and waned. There is no overt signs of cellulitis or infection but we will culture the wound for any occult infection given the new area of depth and erythema. We will hold off on sensitivities for initiation of antibiotic therapy. 02/22/18-She is here in follow up evaluation for left great toe ulcer. There is overall significant improvement in both wound appearance, erythema and edema with changes made last week. She was not initiated on antibiotic therapy. Culture obtained last week showed oxacillin sensitive staph aureus, sensitive to clindamycin. Clindamycin has been called into the pharmacy but she has been instructed to hold off on initiation secondary to overall clinical improvement and her history of antibiotic intolerance. She has been instructed to contact the clinic with any  noted changes/deterioration and the wound, erythema, edema and/or pain. She will follow-up next week. She continues to smoke and her glucose levels remain elevated >250; she admits to compliance with offloading shoe 03/01/18 on evaluation today patient appears to be doing fairly well in regard to her left first toe ulcer. She has been tolerating the dressing changes with the [facility] Dressing without complication and overall this has definitely showed signs of improvement according to records as well is what the patient tells me today. I'm very pleased in that regard. She is having no pain today 03/08/18 She is here for follow up evaluation of a left great toe ulcer. She remains non-compliant with glucose control and smoking cessation; glucose levels consistently >200. She states that she got new shoe inserts/peg assist. She admits to compliance with offloading. Since my last evaluation there is significant improvement. We will switch to prisma at this time and she will follow up next week. She is noted to be tachycardic at this appointment, heart rate 120s; she has a history of heart rate 70-130 according to our records. She admits to extreme agitation r/t personal issues; she was advised to monitor her heartrate and contact her physician if it does not return to a more normal range (<100). She takes cardizem twice daily. 03/15/18-She is here in follow-up evaluation for left great toe ulcer. She remains noncompliant with glucose control and smoking cessation. She  admits to compliance with wearing offloading shoe. The ulcer is improved/stable and we will continue with the same treatment plan and she will follow-up next week 03/22/18-She is here for evaluation for left great toe ulcer. There continues to be significant improvement despite recurrent hyperglycemia (over 500 yesterday) and she continues to smoke. She has been compliant with offloading and we will continue with same treatment plan and  she will follow-up next week. [name], [name] ([id number]) 03/29/18-She is here for evaluation for left great toe ulcer. Despite continuing to smoke and uncontrolled diabetes she continues to improve. She is compliant with offloading shoe. We will continue with the same treatment plan and she will follow-up next week 04/05/18- She is here in follow up evaluation for a left great toe ulcer; she presents with small pustule to left fifth toe (resembles ant bite). She admits to compliance with wearing offloading shoe; continues to smoke or have uncontrolled blood glucose control. There is more callus than usual with evidence of bleeding; she denies known trauma. 04/12/18-She is here for evaluation of left great toe ulcer. Despite noncompliance with glycemic control and smoking she continues to make improvement. She continues to wear offloading shoe. The pustule, that was identified last week, to the left fifth toe is resolved. She will follow-up in 2 weeks 05/03/18-she is seen in follow-up evaluation for a left great toe ulcer. She is compliant with offloading, otherwise noncompliant with glycemic control and smoking. She has plateaued and there is minimal improvement noted. We will transition to [facility], replaced the insert to her surgical shoe and she will follow-up in one week 05/10/18- She is here in follow up evaluation for a left great toe ulcer. It appears stable despite measurement change. We will continue with same treatment plan and follow up next week. 05/24/18-She is seen in follow-up evaluation for a left great toe ulcer. She remains compliant with offloading, has made significant improvement in her diet, decreasing the amount of sugar/soda. She said her recent A1c was 10.9 which is lower than. She did see a diabetic nutritionist/educator yesterday. She continues to smoke. We will continue with the same treatment plan and she'll follow-up next week. 05/31/18- She is seen in follow-up  evaluation for left great toe ulcer. She continues to remain compliant with offloading, continues to make improvement in her diet, increasing her water and decreasing the amount of sugar/soda. She does continue to smoke with no desire to quit. We will apply Prisma to the depth and Hydrofera Blue over. We have not received insurance authorization for oasis. She will follow up next week. 06/07/18-She is seen in follow-up evaluation for left great toe ulcer. It has stalled according to today's measurements although base appears stable. She says she saw a diabetic educator yesterday; her average blood sugars are less than 300 which is an improvement for her. She continues to smoke and states "that's my next step" She continues with water over soda. We will order for xray, culture and reinstate ace wrap compression prior to placing apligraf for next week. She is voicing no complaints or concerns. Her dressing will change to iodoflex over the next week in preparation for apligraf. 06/14/18-She is seen in follow-up evaluation for left great toe ulcer. Plain film x-ray performed last week was negative for osteomyelitis. Wound culture obtained last week grew strep B and OSSA; she is initiated on keflex and cefdinir today; there is erythema to the toe which could be from ace wrap compression, she has a history of  wrapping too tight and has has been encouraged to maintain ace wraps that we place today. We will hold off on application of apligraf today, will apply next week after antibiotic therapy has been initiated. She admits today that she has resumed taking a shower with her foot/toe submerged in water, she has been reminded to keep foot/toe out of the bath water. She will be seen in follow up next week 06/21/18-she is seen in follow-up evaluation for left great toe ulcer. She is tolerating antibiotic therapy with no GI disturbance. The wound is stable. Apligraf was applied today. She has been decreasing her  smoking, only had 4 cigarettes yesterday and 1 today. She continues being more compliant in diabetic diet. She will follow-up next week for evaluation of site, if stable will remove at 2 weeks. 06/28/18- She is here in follow up evalution. Apligraf was placed last week, she states the dressing fell off on Tuesday and she was dressing with hydrofera blue. She is healed and will be discharged from the clinic today. She has been instructed to continue with smoking cessation, continue monitoring glucose levels, offloading for an additional 4 weeks and continue with hydrofera blue for additional two weeks for any possible microscopic opening. Readmission: 08/07/18 on evaluation today patient presents for reevaluation concerning the ulcer of her right great toe. She was previously discharged on 06/28/18 healed. Nonetheless she states that this began to show signs of drainage she subsequently went to her primary care provider. Subsequently an x-ray was performed on 08/01/18 which was negative. The patient was also placed on antibiotics at that time. Fortunately they should have been effective for the infection. Nonetheless she's been experiencing some improvement but still has a lot of drainage coming from the wound itself. 08/14/18 on evaluation today patient's wound actually does show signs of improvement in regard to the erythema at this point. She has completed the antibiotics. With that being said we did discuss the possibility of placing her in a total contact cast as of today although I think that I may want to give this just a little bit more time to ensure nothing recurrence as far as her infection is concerned. I do not want to put in the cast and risk infection at that time if things are not completely resolved. With that being said she is gonna require some debridement today. 08/21/18 on evaluation today patient actually appears to be doing okay in regard to her toe ulcer. She's been tolerating the  dressing changes without complication. With that being said it does appear that she is ready and in fact I think it's appropriate for [place] to go ahead and initiate the total contact cast today. Nonetheless she will require some sharp debridement to prepare the wound for application. Overall I feel like things have been progressing well but we do need to do something to get this to close more readily. 08/24/18 patient seen today for reevaluation after having had the total contact cast applied on Tuesday. She seems to have done very well the wound appears to be doing great and overall I'm pleased with the progress that she's made. There were no abnormal areas of rubbing from the cast on her lower extremity. 08/30/18 on evaluation today patient actually appears to be completely healed in regard to her plantar toe ulcer. She tells me at this point she's been having a lot of issues with the cast. She almost fell a couple of times the state shall the step of her dog  a couple times as well. This is been a very frustrating process for her other nonetheless she has completely healed the wound which is excellent news. Overall there does not appear to be the evidence of infection at this time which is great news. 09/11/18 evaluation today patient presents for follow-up concerning her great toe ulcer on the left which has unfortunately reopened since I last saw her which was only a couple of weeks ago. Unfortunately she was not able to get in to get the shoe and potentially the AFO that's gonna be necessary due to her left foot drop. She continues with offloading shoe but this is not enough to prevent her from reopening it appears. When we last had her in the total contact cast she did well from a healing standpoint but unfortunately the wound reopened as soon as she came out of the cast within just a couple of weeks. 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 This is also part of the reason why she falls on a regular basis. I just do not believe that is gonna be safe for [place] to reinitiate the total contact cast as last time we had this on she fell 3 times one week which is definitely not normal for her. 09/18/18 upon evaluation today the patient actually appears to be doing about the same in regard to her toe ulcer. She did not contact [facility] as I asked her to even though I had given her the prescription. In fact she actually states that she has no idea where the prescription is. She did apparently call [facility] and they told her that all she needed to do was bring the prescription in order to be able to be seen and work on getting the AFO for her left foot. With all that being said she still does not have an appointment and I'm not sure were things stand that regard. I will give her a new prescription today in order to contact them to get this set up. [name], [name] ([id number]) 09/25/18 on evaluation today patient actually appears to be doing about the same in regard to her toes ulcer. She does have a small areas which seems to have a lot of callous buildup around the edge of the wound which is going to need sharp debridement today. She still is waiting to be scheduled for evaluation with [facility] for possibility of an AFO. She states there supposed to call her tomorrow to get this set up. Unfortunately it does appear that her foot specifically the toe area is showing signs of erythema. There does not appear to be any systemic infection which is in these good news. 10/02/18 on evaluation today patient actually appears to be doing about the same in regard to her toe ulcer. This really has not done too well although it's not significantly larger it's also not significantly smaller. She has been tolerating the dressing  changes without complication. She actually has her appointment with [facility] and [place] tomorrow to hopefully be measured for obtaining and AFO splint. I think this would be helpful preventing this from reoccurring. We had contemplated starting the cast this week although to be honest I am reluctant to do that as she's been having nausea, vomiting, and seizure activity over the past  three days. She has a history of seizures and have been told is nothing that can be done for these. With that being said I do believe that along with the seizures have the nausea vomiting which upon further questioning doesn't seem to be the normal for her and makes me concerned for the possibility of infection or something else going on. I discussed this with the patient and her mother during the office visit today. I do not feel the wound is effective but maybe something else. The responses this was "this just happens to her at times and we don't know why". They did not seem to be interested in going to the hospital to have this checked out further. 10/09/18 on evaluation today patient presents for follow-up concerning her ongoing toe ulcer. She has been tolerating the dressing changes without complication. Fortunately there does not appear to be any evidence of infection which is great news however I do think that the patient would benefit from going ahead for with the total contact cast. She's actually in a wheelchair today she tells me that she will use her walker if we initiate the cast. I was very specific about the fact that if we were gonna do the cast I wanted to make sure that she was using the walker in order to prevent any falls. She tells me she does not have stairs that she has to traverse on a regular basis at her home. She has not had any seizures since last week again that something that happens to her often she tells me she did talk to [employer] and they said that it may take up to three weeks to get the  brace approved for her. Hopefully that will not take that long but nonetheless in the meantime I do think the cast could be of benefit. 10/12/18 on evaluation today patient appears to be doing rather well in regard to her toe ulcer. It's just been a few days and already this is significantly improved both as far as overall appearance and size. Fortunately there's no sign of infection. She is here for her first obligatory cast change. 10/19/18 Seen today for follow up and management of left great toe ulcer. Wound continues to show improvement. Noted small open area with seroussang drainage with palpation. Denies any increased pain or recent fevers during visit. She will continue calcium alginate with offloading shoe. Denies any questions or concerns during visit. 10/26/18 on evaluation today patient appears to be doing about the same as when I last saw her in regard to her wound bed. Fortunately there does not appear to be any signs of infection. Unfortunately she continues to have a breakdown in regard to the toe region any time that she is not in the cast. It takes almost no time at all for this to happen. Nonetheless she still has not heard anything from the brace being made by [employer] as to when exactly this will be available to her. Fortunately there is no signs of infection at this time. 10/30/18 on evaluation today patient presents for application of the total contact cast as we just received him this morning. Fortunately we are gonna be able to apply this to her today which is great news. She continues to have no significant pain which is good news. Overall I do feel like things have been improving while she was the cast is when she doesn't have a cast that things get worse. She still has not really heard anything from [employer]  regarding her brace. 11/02/18 upon evaluation today patient's wound already appears to be doing significantly better which is good news. Fortunately there does not appear to  be any signs of infection also good news. Overall I do think the total contact cast as before is helping to heal this area unfortunately it's just not gonna likely keep the area closed and healed without her getting her brace at least. Again the foot drop is a significant issue for her. 11/09/18 on evaluation today patient appears to be doing excellent in regard to her toe ulcer which in fact is completely healed. Fortunately we finally got the situation squared away with the paperwork which was needed to proceed with getting her brace approved by Medicaid. I have filled that out unfortunately that information has been sent to the orthopedic office that I worked at 2 1/2 years ago and not tired Current wound care measures. Fortunately she seems to be doing very well at this time. 11/23/18 on evaluation today patient appears to be doing More Poorly Compared to Last Time I Saw Her. At [facility] She Had Completely Healed. Currently she is continuing to have issues with reopening. She states that she just found out that the brace was approved through Medicaid now she just has to go get measured in order to have this fitted for her and then made. Subsequently she does not have an appointment for this yet that is going to complicate things we obviously cannot put her back in the cast if we do not have everything measured because they're not gonna be able to measure her foot while she is in the cast. 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 Obviously this is unfortunate and does have me somewhat worried as well. 11/30/18 on evaluation today patient's toe ulcer actually appears to be doing fairly well. The good news is she will be getting her brace in the shoes next week on Wednesday. Hopefully we will be able to get this to heal without having to go back in the cast however she may need the cast in order to get  the wound completely heal and then go from there. Fortunately there's no signs of infection at this time. 12/07/18 on evaluation today patient fortunately did receive her brace and she states she could tell this definitely makes her walk better. With that being said she's been having issues with her toe where she noticed yesterday there was a lot of tissue that was loosing off this appears to be much larger than what it was previous. She also states that her leg has been read putting much across the top of her foot just about the ankle although this seems to be receiving somewhat. The total area is still red and appears to be someone infected as best I can tell. She is previously taken Bactrim and that may be a good option for her today as well. We are gonna see what I wound culture shows as well and I think that this is definitely appropriate. With that being said outside of the culture I still need to initiate something in the interim and that's what I'm gonna go ahead and select Bactrim is a good option for her. 12/14/18 on evaluation today patient appears to be doing better in regard to her left great toe ulcer as compared to last week's evaluation. There's still some erythema although this  is significantly improved which is excellent news. Overall I do believe that she is making good progress is still gonna take some time before she is where I would like her to be from the standpoint of being able to place her back into the total contact cast. Hopefully we will be where we need to be by next week. 12/21/18 on evaluation today patient actually appears to be doing poorly in regard to her toe ulcer. She's been tolerating the dressing changes without complication. Fortunately there's no signs of systemic infection although she does have a lot of drainage from the toe ulcer and this does [name], [name] ([id number]) seem to be causing some issues at this point. She does have erythema on the distal  portion of her toe that appears to be likely cellulitis. 12/28/18 on evaluation today patient actually appears to be doing a little better in my pinion in regard to her toe ulcer. With that being said she still does have some evidence of infection at this time and for her culture she had both E. coli as well as enterococcus as organisms noted on evaluation. For that reason I think that though the Keflex likely has treated the E. coli rather well this has really done nothing for the enterococcus. We are going to have to initiate treatment for this specifically. 01/04/19 on evaluation today patient's toe actually appears to be doing better from the standpoint of infection. She currently would like to see about putting the cash back on I think that this is appropriate as long as she takes care of it and keeps it from getting wet. She is gonna have some drainage we can definitely pass this up with Drawtex and alginate to try to prevent as much drainage as possible from causing the problems. With that being said I do want to at least try her with the cast between now and Tuesday. If there any issues we can't continue to use it then I will discontinue the use of the cast at that point. 01/08/19 on evaluation today patient actually appears to be doing very well as far as her foot ulcer specifically the great toe on the left is concerned. She did have an area of rubbing on the medial aspect of her left ankle which again is from the cast. Fortunately there's no signs of infection at this point in this appears to be a very slight skin breakdown. The patient tells me she felt it rubbing but didn't think it was that bad. Fortunately there is no signs of active infection at this time which is good news. No fevers, chills, nausea, or vomiting noted at this time. 01/15/19 on evaluation today patient actually appears to be doing well in regard to her toe ulcer. Again as previous she seems to do well and she has the cast  on which indicates to me that during the time she doesn't have a cast on she's putting way too much pressure on this region. Obviously I think that's gonna be an issue as with the current national emergency concerning the Covid-19 Virus it has been recommended that we discontinue the use of total contact casting by the chief medical officer of our company, Dr. Simona Huh. The reasoning is that if a patient becomes sick and cannot come into have the cast removed they could not just leave this on for an additional two weeks. Obviously the hospitals also do not want to receive patient's who are sick into the emergency department to potentially contaminate  the region and spread the Covid-19 Virus among other sick individuals within the hospital system. Therefore at this point we are suspending the use of total contact cast until the current emergency subsides. This was all discussed with the patient today as well. 01/22/19 on evaluation today patient's wound on her left great toe appears to be doing slightly worse than previously noted last week. She tells me that she has been on this quite a bit in fact she tells me she's been awake for 38 straight hours. This is due to the fact that she's having to care for grandparents because nobody else will. She has been taking care of them for five the last seven days since I've seen her they both have dementia his is from a stroke and her grandmother's was progressive. Nonetheless she states even her mom who knows her condition and situation has only help two of those days to take care of them she's been taking care of the rest. Fortunately there does not appear to be any signs of active infection in regard to her toe at this point although obviously it doesn't look as good as it did previous. I think this is directly related to her not taking off the pressure and friction by way of taking things easy. Though I completely understand what's going on. 01/29/19 on evaluation  today patient's tools are actually appears to be showing some signs of improvement today compared to last week's evaluation as far as not necessarily the overall size of the wound but the fact that she has some new skin growth in between the two ends of the wound opening. Overall I feel like she has done well she states that she had a family member give her what sounds to be a CAM walker boot which has been helpful as well. 02/05/19 on evaluation today patient's wound bed actually appears to be doing significantly better in regard to her overall appearance of the size of the wound. With that being said she is still having an issue with offloading efficiently enough to get this to close. Apparently there is some signs of infection at this point as well unfortunately. Previously she's done well of Augmentin I really do not see anything that needs to be culture currently but there theme and cellulitis of the foot that I'm seeing I'm gonna go ahead and place her on an antibiotic today to try to help clear this up. 02/12/2019 on evaluation today patient actually appears to be doing poorly in regard to her overall wound status. She tells me she has been using her offloading shoe but actually comes in today wearing her tennis shoe with the AFO brace. Again as I previously discussed with her this is really not sufficient to allow the area to heal appropriately. Nonetheless she continues to be somewhat noncompliant and I do wonder based on what she has told my nurse in the past as to whether or not a good portion of this noncompliance may be recreational drug and alcohol related. She has had a history of heroin overdose and this was fairly recently in the past couple of months that have been seeing her. Nonetheless overall I feel like her wound looks significantly worse today compared to what it was previous. She still has significant erythema despite the Augmentin I am not sure that this is an appropriate  medication for her infection I am also concerned that the infection may have gone down into her bone. 02/19/19 on evaluation today patient actually  appears to be doing about the same in regard to her toe ulcer. Unfortunately she continues to show signs of bone exposure and infection at this point. There does not appear to be any evidence of worsening of the infection but I'm also not really sure that it's getting significantly better. She is on the Augmentin which should be sufficient for the Staphylococcus aureus infection that she has at this point. With that being said she may need IV antibiotics to more appropriately treat this. We did have a discussion today about hyperbaric option therapy. 02/28/19 on evaluation today patient actually appears to be doing much worse in regard to the wound on her left great toe as compared to even my previous evaluation last week. Unfortunately this seems to be training in a pretty poor direction. Her toe was actually now starting to angle laterally and I can actually see the entire joint area of the proximal portion of the digit where is the distal portion of the digit again is no longer even in contact with the joint line. Unfortunately there's a lot more necrotic tissue around the edge and the toe appears to be showing signs of becoming gangrenous in my pinion. I'm very concerned about were things stand at this point. She did see infectious disease and they are planning to send in a prescription for Sivextro for her and apparently this has been approved. With that being said I don't think she should avoid taking this but at the same time I'm not sure that it's gonna be sufficient to save her toe at this point. 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 03/05/19  on evaluation today patient actually appears to be doing quite well in regard to her toe all things considering. She still has bone exposed but there appears to be much less your thing on overall the appearance of the wound and the toe itself is dramatically improved. She still does have some issues currently obviously with infection she did see vascular as well and there concerned that her blood flow to the toad. For that reason they are setting up for an angiogram next week. 03/14/19 on evaluation today patient appears to be doing very poor in regard to her toe and specifically in regard to the ulceration and the fact that she's starting to notice the toe was leaning even more towards the lateral aspect and the complete joint is visible on the proximal aspect of the joint. Nonetheless she's also noted a significant odor and the tip of the toe is turning more dark and necrotic appearing. Overall I think she is getting worse not better as far as this is concerned. For that reason I am recommending at this point that she likely needs to be seen for likely amputation. [name], [name] ([id number]) READMISSION 03/19/2021 This is a patient that we cared for in this clinic for a prolonged period of time in 2019 and 2020 with a left foot and left first toe wound. 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 Luana Shu. In December 2021 she had an ulcer on her right great toe as well as the fourth and fifth toes. She underwent a  partial ray amputation of the right fourth and fifth toes. She also had an angiogram at that time and underwent angioplasty of the right anterior tibial artery. In any case she claims that the wound on the right foot is closed I did not look at this today which was probably an oversight although I think that should be done next week. After her surgery she developed a  dehiscence but I do not see any follow-up of this. According to Dr. Deborra Medina last review that she was out of the area being cared for by another physician but recently came back to his attention. 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 A culture of this area showed E. coli apparently before she came back to see Dr. Luana Shu she was supposed to be receiving antibiotics but she did not really take them. Nor is she offloading this area at all. Finally her last hemoglobin A1c listed in epic was in March 2022 at 14.1 she says things are a lot better since then although I am not sure. 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 She had this wound at the time. During this admission she had E. coli sepsis a TEE was negative. 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 She is still a heavy cigarette smoker. She has a history of bipolar disorder seizure disorder ABI in our clinic was 1.05 6/1; left midfoot in the setting of a TMA done previously. Round circular wound with a "knuckle" of protruding tissue. The problem is that the knuckle was not attached to any of the surrounding granulation and this probed proximally widely I removed a large portion of this tissue. This wound goes with considerable undermining laterally. I do not feel any bone there was no purulence but this is a deep wound. 6/8; in spite of the debridement I did last week. She arrives with a wound looking exactly the same. A protruding "knuckle" of tissue nonadherent to most of the surrounding tissue. There is considerable depth around this from 6-12 o'clock at 2.7 cm and undermining of 1 cm.  This does not look overtly infected and the x-ray I did last week was negative for any osseous abnormalities. We have been using silver collagen 6/15; deep tissue culture I did last week showed moderate staph aureus and moderate Pseudomonas. This will definitely require prolonged antibiotic therapy. The pathology on the protuberant area was negative for malignancy fungus etc. 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 We have been using silver collagen. I am going to be prescribing Levaquin for 2 weeks. Her CT scan of the foot is down for 7/5 6/22; CT scan of the foot on 7 5. She says she has hardware in the left leg from her previous fracture. She is on the Levaquin for the deep tissue culture I did that showed methicillin sensitive staph aureus and Pseudomonas. I gave her a 2-week supply and she will have another week. She arrives in clinic today with the same protuberant tissue however this is nonadherent to the tissue surrounding it. I am really at a loss to explain this unless there is underlying deep tissue infection 6/29; patient presents for 1 week follow-up. She  has been using collagen to the wound bed. She reports taking her antibiotics as prescribed.She has no complaints or issues today. She denies signs of infection. 7/6; patient presents for one week followup. She has been using collagen to the wound bed. She states she is taking Levaquin however at times she is not able to keep it down. She denies signs of infection. 7/13; patient presents for 1 week follow-up. She has been using silver alginate to the wound bed. She still has nausea when taking her antibiotics. She denies signs of infection. 7/20; patient presents for 1 week follow-up. She has been using silver alginate with gentamicin cream to the wound bed. She denies any issues and has no complaints today. She denies signs of infection. 7/27; patient presents for 1 week  follow-up. She continues to use silver alginate with gentamicin cream to the wound bed. She reports starting her antibiotics. She has no issues or complaints. Overall she reports stability to the wound. 8/3; patient presents for 1 week follow-up. She has been using silver alginate with gentamicin cream to the wound bed. She reports completing all antibiotics. She has no issues or complaints today. She denies signs of infection. 8/17; patient presents for 2-week follow-up. He is to use silver alginate to the wound bed. She has no issues or complaints today. She denies signs of infection. She reports her pain has improved in her foot since last clinic visit 8/24; patient presents for 1 week follow-up. She continues to use silver alginate to the wound bed. She has no issues or complaints. She denies signs of infection. Pain is stable. 9/7; patient presents for follow-up. She missed her last week appointment due to feeling ill. She continues to use silver alginate. She has a new wound to the right lower extremity that is covered in eschar. She states It occurred over the past week and has no idea how it started. She currently denies signs of infection. 9/14; patient presents for follow-up. To the left foot wound she has been using gentamicin cream and silver alginate. To the right lower extremity wound she has been keeping this covered and has not obtain Santyl. 9/21; patient presents for follow-up. She reports using gentamicin cream and silver alginate to the left foot and Santyl to the right lower extremity wound. She has no issues or complaints today. She denies signs of infection. 9/28; patient presents for follow-up. She reports a new wound to her right heel. She states this occurred a few days ago and is progressively gotten worse. She has been trying to clean the area with a Q-tip and Santyl. She reports stability in the other 2 wounds. She has been using gentamicin cream and silver alginate to  the left foot and Santyl to the right lower extremity wound. 10/12; patient presents for follow-up. She reports improvement to the wound beds. She is seeing vein and vascular to discuss the potential of a left BKA. She states they are going to do an arteriogram. She continues to use silver alginate with dressing changes to her wounds. 11/2; patient presents for follow-up. She states she has not been doing dressing changes to the wound beds. She states she is not able to offload the areas. She reports chronic pain to her left foot wound. 11/9; patient presents for follow-up. She came in with only socks on. She states she forgot to put on shoes. It is unclear if she is doing any dressing changes. She currently denies systemic signs of infection. 11/16; patient presents  for follow-up. She came again only with socks on. She states she does not wear shoes ever. It is unclear if she does dressing changes. She currently denies systemic signs of infection. Patient History [name], [name] ([id number]) Information obtained from Patient. Social History Current every day smoker, Marital Status - Single, Alcohol Use - Never, Drug Use - Prior History, Caffeine Use - Daily. Medical History Ear/Nose/Mouth/Throat Patient has history of Chronic sinus problems/congestion, Middle ear problems Hematologic/Lymphatic Patient has history of Anemia Respiratory Patient has history of Chronic Obstructive Pulmonary Disease (COPD) Cardiovascular Patient has history of Congestive Heart Failure - EF 33% Endocrine Patient has history of Type II Diabetes Genitourinary Patient has history of End Stage Renal Disease Integumentary (Skin) Patient has history of History of pressure wounds Neurologic Patient has history of Neuropathy - feel and legs Objective Constitutional Vitals Time Taken: 3:46 PM, Height: 69 in, Weight: 185 lbs, BMI: 27.3, Temperature: 99.2 F, Pulse: 134 bpm, Respiratory Rate: 18  breaths/min, Blood Pressure: 100/70 mmHg. General Notes: Left foot (Transmetatarsal amputation): Wound to the plantar aspect that has granulation tissue (Although tissue does not appear healthy) and nonviable tissue present. Green blue drainage to the periwound. Right lower extremity: To the anterior aspect there is an open wound with Nonviable tissue throughout that easily came off with debridement. Granulation tissue present. Right foot: To the heel there is an open wound with Granulation tissue present. Circumferential callus. Circumferential undermining noted. Integumentary (Hair, Skin) Wound #7 status is Open. Original cause of wound was Gradually Appeared. The date acquired was: 08/07/2020. The wound has been in treatment 24 weeks. The wound is located on the [place]. The wound measures 6.4cm length x 4cm width x 0.5cm depth; 20.106cm^2 area and 10.053cm^3 volume. There is Fat Layer (Subcutaneous Tissue) exposed. There is no tunneling or undermining noted. There is a medium amount of serosanguineous drainage noted. The wound margin is thickened. There is small (1-33%) red, pink, hyper - granulation within the wound bed. There is a large (67-100%) amount of necrotic tissue within the wound bed including Eschar and Adherent Slough. General Notes: Dog hair all through-out the wound. Wound #8 status is Open. Original cause of wound was Gradually Appeared. The date acquired was: 06/30/2021. The wound has been in treatment 10 weeks. The wound is located on the Right,Anterior Lower Leg. The wound measures 2.5cm length x 1.5cm width x 0.1cm depth; 2.945cm^2 area and 0.295cm^3 volume. There is Fat Layer (Subcutaneous Tissue) exposed. There is no tunneling or undermining noted. There is a none present amount of drainage noted. The wound margin is flat and intact. There is no granulation within the wound bed. There is a large (67-100%) amount of necrotic tissue within the wound bed  including Eschar. General Notes: Scab covered, no dressing on wound. Wound #9 status is Open. Original cause of wound was Gradually Appeared. The date acquired was: 07/18/2021. The wound has been in treatment 7 weeks. The wound is located on the Right Calcaneus. The wound measures 0.5cm length x 0.3cm width x 0.4cm depth; 0.118cm^2 area and 0.047cm^3 volume. There is Fat Layer (Subcutaneous Tissue) exposed. There is a medium amount of serosanguineous drainage noted. The wound margin is flat and intact. There is medium (34-66%) granulation within the wound bed. There is a medium (34-66%) amount of necrotic tissue within the wound bed including Adherent Slough. Assessment Active Problems ICD-10 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ity Unspecified open wound, right lower leg, subsequent encounter  NORMAJEAN, NASH A. ([id number]) Type 2 diabetes mellitus with foot ulcer Type 2 diabetes mellitus with diabetic polyneuropathy Unspecified open wound, right foot, initial encounter Patient's right lower extremity wounds are stable. I recommended silver alginate to the heel and antibiotic ointment to the right anterior shin wound. Her left foot wound looks larger. She had dog hair in the wound bed. She does not wear shoes. Patient does not actively participate in her care. She has been given Pegasys Inserts with surgical shoes to help with offloading but does not wear them. I debrided nonviable tissue. She has blue-green drainage consistent with Pseudomonas and I will start ciprofloxacin. Also recommended Dakin's wet-to-dry dressings to her left foot. No signs of systemic infection. Procedures Wound #7 Pre-procedure diagnosis of Wound #7 is a Diabetic Wound/Ulcer of the Lower Extremity located on the [place] .Severity of Tissue Pre Debridement is: Fat layer exposed. There was a Excisional Skin/Subcutaneous Tissue Debridement with a total area of  12 sq cm performed by Kalman Shan, MD. With the following instrument(s): Curette to remove Viable and Non-Viable tissue/material. Material removed includes Subcutaneous Tissue, Slough, Skin: Dermis, and Skin: Epidermis. No specimens were taken. A time out was conducted at 16:26, prior to the start of the procedure. A Minimum amount of bleeding was controlled with Pressure. The procedure was tolerated well. Post Debridement Measurements: 6.4cm length x 4cm width x 0.5cm depth; 10.053cm^3 volume. Character of Wound/Ulcer Post Debridement is stable. Severity of Tissue Post Debridement is: Fat layer exposed. Post procedure Diagnosis Wound #7: Same as Pre-Procedure Plan Follow-up Appointments: Return Appointment in 1 week. Bathing/ Shower/ Hygiene: Wash wounds with antibacterial soap and water. Off-Loading: Open toe surgical shoe with peg assist. Turn and reposition every 2 hours Other: - peg shoe right Medications-Please add to medication list.: P.O. Antibiotics The following medication(s) was prescribed: ciprofloxacin HCl oral 750 mg tablet 1 1 tablet oral BID x 7 days starting 09/08/2021 Dakin's Solution miscellaneous 0.125 % solution 1 wet to dry dressings twice daily starting 09/08/2021 WOUND #7: - Foot Wound Laterality: Plantar, Left, Midline Cleanser: Dakin 16 (oz) 0.25 1 x Per Day/30 Days Discharge Instructions: Use as directed. Primary Dressing: Gauze 1 x Per Day/30 Days Discharge Instructions: As directed: moistened with Dakins Solution Secured With: Kerlix Roll Sterile or Non-Sterile 6-ply 4.5x4 (yd/yd) 1 x Per Day/30 Days Discharge Instructions: Apply Kerlix as directed WOUND #8: - Lower Leg Wound Laterality: Right, Anterior Cleanser: Soap and Water 1 x Per Day/30 Days Discharge Instructions: Gently cleanse wound with antibacterial soap, rinse and pat dry prior to dressing wounds Topical: Bacitracin Ointment, 1 (oz) tube 1 x Per Day/30 Days Secured With: Kerlix Roll  Sterile or Non-Sterile 6-ply 4.5x4 (yd/yd) 1 x Per Day/30 Days Discharge Instructions: Apply Kerlix as directed WOUND #9: - Calcaneus Wound Laterality: Right Cleanser: Soap and Water 1 x Per Day/30 Days Discharge Instructions: Gently cleanse wound with antibacterial soap, rinse and pat dry prior to dressing wounds Primary Dressing: Hydrofera Blue Ready Transfer Foam, 4x5 (in/in) (Generic) 1 x Per Day/30 Days Discharge Instructions: Apply Hydrofera Blue Ready to wound bed as directed Secured With: coverlet 1 x Per Day/30 Days 1. In office sharp debridement 2. Silver alginate, antibiotic ointment and Dakin's wet-to-dry dressings 3. Follow-up in 1 week 4. Aggressive offloading Skillin, Shyniece A. ([id number]) Electronic Signature(s) Signed: 09/08/2021 5:00:18 PM By: Kalman Shan DO Entered By: Kalman Shan on 09/08/2021 16:59:55 [name] ([id number]) -------------------------------------------------------------------------------- ROS/PFSH Details Patient Name: Heather Dear A. Date of Service: 09/08/2021 3:30 PM  Medical Record Number: [id number] Patient Account Number: [id number] Date of Birth/Sex: 30-May-1976 (45 y.o. F) Treating RN: Cornell Barman Primary Care Provider: Raelene Bott Other Clinician: Referring Provider: Raelene Bott Treating Provider/Extender: Yaakov Guthrie in Treatment: 24 Information Obtained From Patient Ear/Nose/Mouth/Throat Medical History: Positive for: Chronic sinus problems/congestion; Middle ear problems Hematologic/Lymphatic Medical History: Positive for: Anemia Respiratory Medical History: Positive for: Chronic Obstructive Pulmonary Disease (COPD) Cardiovascular Medical History: Positive for: Congestive Heart Failure - EF 33% Endocrine Medical History: Positive for: Type II Diabetes Time with diabetes: 14 years Treated with: Insulin, Oral agents Blood sugar tested every day: No Blood sugar testing results: Bedtime:  176 Genitourinary Medical History: Positive for: End Stage Renal Disease Integumentary (Skin) Medical History: Positive for: History of pressure wounds Neurologic Medical History: Positive for: Neuropathy - feel and legs HBO Extended History Items Ear/Nose/Mouth/Throat: Ear/Nose/Mouth/Throat: Chronic sinus problems/congestion Middle ear problems Immunizations Pneumococcal Vaccine: Received Pneumococcal Vaccination: No Frankl, Sharmaine A. ([id number]) Implantable Devices No devices added Family and Social History Current every day smoker; Marital Status - Single; Alcohol Use: Never; Drug Use: Prior History; Caffeine Use: Daily Electronic Signature(s) Signed: 09/08/2021 4:58:16 PM By: Gretta Cool, BSN, RN, CWS, [name] RN, BSN Signed: 09/08/2021 5:00:18 PM By: Kalman Shan DO Entered By: Kalman Shan on 09/08/2021 16:49:40 Milanes, Meghin AMarland Kitchen ([id number]) -------------------------------------------------------------------------------- SuperBill Details Patient Name: Heather Dear A. Date of Service: 09/08/2021 Medical Record Number: [id number] Patient Account Number: [id number] Date of Birth/Sex: [birth year] (45 y.o. F) Treating RN: Cornell Barman Primary Care Provider: Raelene Bott Other Clinician: Referring Provider: Raelene Bott Treating Provider/Extender: Yaakov Guthrie in Treatment: 24 Diagnosis Coding ICD-10 Codes Code Description [phone] 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ity S81.801D Unspecified open wound, right lower leg, subsequent encounter E11.621 Type 2 diabetes mellitus with foot ulcer E11.42 Type 2 diabetes mellitus with diabetic polyneuropathy S91.301A Unspecified open wound, right foot, initial encounter Facility Procedures CPT4 Code: [id number] Description: [facility] - DEB SUBQ TISSUE 20 SQ CM/< Modifier: Quantity: 1 CPT4 Code: Description: ICD-10 Diagnosis Description L97.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 of other  part of left foot with other specified E11.621 Type 2 diabetes mellitus with foot ulcer Modifier: severity Quantity: Physician Procedures CPT4 Code: [id number] Description: 50277 - WC PHYS LEVEL 3 - EST PT Modifier: Quantity: 1 CPT4 Code: Description: ICD-10 Diagnosis Description 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 E11.621 Type 2 diabetes mellitus with foot ulcer Modifier: severity Quantity: CPT4 Code: [id number] Description: 11042 - WC PHYS SUBQ TISS 20 SQ CM Modifier: Quantity: 1 CPT4 Code: Description: ICD-10 Diagnosis Description 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 E11.621 Type 2 diabetes mellitus with foot ulcer Modifier: severity Quantity: Electronic Signature(s) Signed: 09/08/2021 5:00:18 PM By: Kalman Shan DO Previous Signature: 09/08/2021 4:46:05 PM Version By: Kalman Shan DO Entered By: Kalman Shan on 09/08/2021 17:00:02

## 2021-09-08 NOTE — Progress Notes (Signed)
[name], [name] ([id number]) Visit Report for 09/08/2021 Arrival Information Details Patient Name: [name], [name] A. Date of Service: 09/08/2021 3:30 PM Medical Record Number: [id number] Patient Account Number: [id number] Date of Birth/Sex: August 18, 1976 (45 y.o. F) Treating RN: Cornell Barman Primary Care Zaynah Chawla: Raelene Bott Other Clinician: Referring Nohely Whitehorn: Raelene Bott Treating Lareen Mullings/Extender: Yaakov Guthrie in Treatment: 24 Visit Information History Since Last Visit Has Footwear/Offloading in Place as Prescribed: No Patient Arrived: Wheel Chair Left: Surgical Shoe with Pressure Relief Arrival Time: 15:45 Insole Accompanied By: mommas husband Right: Surgical Shoe with Pressure Relief Transfer Assistance: None Insole Patient Identification Verified: Yes Pain Present Now: Yes Secondary Verification Process Completed: Yes Patient Requires Transmission-Based No Precautions: Patient Has Alerts: Yes Patient Alerts: Patient on Blood Thinner 81mg  apirin Diabetic Type II Electronic Signature(s) Signed: 09/08/2021 4:58:16 PM By: Gretta Cool, BSN, RN, CWS, [name] RN, BSN Entered By: Gretta Cool, BSN, RN, CWS, [name] on 09/08/2021 15:46:47 [name] ([id number]) -------------------------------------------------------------------------------- Encounter Discharge Information Details Patient Name: Heather Dear A. Date of Service: 09/08/2021 3:30 PM Medical Record Number: [id number] Patient Account Number: [id number] Date of Birth/Sex: Oct 27, 1975 (45 y.o. F) Treating RN: Cornell Barman Primary Care Zalika Tieszen: Raelene Bott Other Clinician: Referring Cassundra Mckeever: Raelene Bott Treating Harrie Cazarez/Extender: Yaakov Guthrie in Treatment: 24 Encounter Discharge Information Items Post Procedure Vitals Discharge Condition: Stable Temperature (F): 99.5 Ambulatory Status: Wheelchair Pulse (bpm): 134 Discharge Destination: Home Respiratory Rate (breaths/min): 18 Transportation:  Private Auto Blood Pressure (mmHg): 134/88 Accompanied By: mom's husband Schedule Follow-up Appointment: Yes Clinical Summary of Care: Electronic Signature(s) Signed: 09/08/2021 4:58:16 PM By: Gretta Cool, BSN, RN, CWS, [name] RN, BSN Entered By: Gretta Cool, BSN, RN, CWS, [name] on 09/08/2021 16:57:57 [name] ([id number]) -------------------------------------------------------------------------------- Lower Extremity Assessment Details Patient Name: Heather Dear A. Date of Service: 09/08/2021 3:30 PM Medical Record Number: [id number] Patient Account Number: [id number] Date of Birth/Sex: 15-Aug-1976 (45 y.o. F) Treating RN: Cornell Barman Primary Care Elzora Cullins: Raelene Bott Other Clinician: Referring Shaheem Pichon: Raelene Bott Treating Ana Liaw/Extender: Yaakov Guthrie in Treatment: 24 Edema Assessment Assessed: [Left: Yes] [name]: Yes] Edema: [Left: No] [Right: No] Vascular Assessment Pulses: Dorsalis Pedis Palpable: [Left:Yes] [Right:Yes] Electronic Signature(s) Signed: 09/08/2021 4:58:16 PM By: Gretta Cool, BSN, RN, CWS, [name] RN, BSN Entered By: Gretta Cool, BSN, RN, CWS, [name] on 09/08/2021 16:01:00 [name] ([id number]) -------------------------------------------------------------------------------- Multi Wound Chart Details Patient Name: Heather Dear A. Date of Service: 09/08/2021 3:30 PM Medical Record Number: [id number] Patient Account Number: [id number] Date of Birth/Sex: 12-21-1975 (45 y.o. 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 Extremity Extremity Comorbid History: Chronic sinus problems/congestion, Chronic sinus problems/congestion, Chronic sinus problems/congestion, Middle ear problems, Anemia, Middle ear problems, Anemia, Middle ear problems, Anemia, Chronic Obstructive Pulmonary Chronic Obstructive Pulmonary Chronic Obstructive Pulmonary Disease (COPD), Congestive Heart Disease (COPD), Congestive Heart Disease (COPD), Congestive Heart Failure, Type II Diabetes, End Stage Failure, Type II Diabetes, End Stage Failure, Type II Diabetes, End Stage Renal Disease, History of pressure Renal Disease, History of pressure Renal Disease, History of pressure wounds, Neuropathy wounds, Neuropathy wounds, Neuropathy Date Acquired: 08/07/2020 06/30/2021 07/18/2021 Weeks of Treatment: 24  10 7 Wound Status: Open Open Open Measurements L x W x D (cm) 6.4x4x0.5 2.5x1.5x0.1 0.5x0.3x0.4 Area (cm) : 20.106 2.945 0.118 Volume (cm) : 10.053 0.295 0.047 % Reduction in Area: -653.00% 53.10% 64.20% % Reduction in Volume: -653.00% 53.00% 79.70% Classification: Grade 2 Grade 1 Grade 1 Exudate Amount: Medium None Present Medium Exudate Type: Serosanguineous N/A Serosanguineous Exudate Color: red, brown N/A red, brown Wound Margin: Thickened Flat and Intact Flat and Intact Granulation Amount: Small (1-33%) None Present (0%) Medium (34-66%) Granulation Quality: Red, Pink, Hyper-granulation N/A N/A Necrotic Amount: Large (67-100%) Large (67-100%) Medium (34-66%) Necrotic Tissue: Eschar, Adherent [place] Exposed Structures: Fat Layer (Subcutaneous Tissue): Fat Layer (Subcutaneous Tissue): Fat Layer (Subcutaneous Tissue): Yes Yes Yes Fascia: No Fascia: No Fascia: No Tendon: No Tendon: No Tendon: No Muscle: No Muscle: No Muscle: No Joint:  No Joint: No Joint: No Bone: No Bone: No Bone: No Epithelialization: None None Large (67-100%) Debridement: Debridement - Excisional N/A N/A Pre-procedure Verification/Time 16:26 N/A N/A Out Taken: Tissue Debrided: Subcutaneous, Slough N/A N/A Level: Skin/Subcutaneous Tissue N/A N/A Debridement Area (sq cm): 12 N/A N/A Instrument: Curette N/A N/A [name], [name] A. ([id number]) Bleeding: Minimum N/A N/A Hemostasis Achieved: Pressure N/A N/A Debridement Treatment Procedure was tolerated well N/A N/A Response: Post Debridement 6.4x4x0.5 N/A N/A Measurements L x W x D (cm) Post Debridement Volume: 10.053 N/A N/A (cm) Assessment Notes: Dog hair all through-out the wound. Scab covered, no dressing on N/A wound. Procedures Performed: Debridement N/A N/A Treatment Notes Electronic Signature(s) Signed: 09/08/2021 5:00:18 PM By: Kalman Shan DO Entered By: Kalman Shan on 09/08/2021 16:48:30 [name] ([id number]) -------------------------------------------------------------------------------- [place] Plan Details Patient Name: Heather Dear A. Date of Service: 09/08/2021 3:30 PM Medical Record Number: [id number] Patient Account Number: [id number] Date of Birth/Sex: 21-Feb-1976 (45 y.o. F) Treating RN: Cornell Barman Primary Care Linet Brash: Raelene Bott Other Clinician: Referring Bryella Diviney: Raelene Bott Treating Cristle Jared/Extender: Yaakov Guthrie in Treatment: 24 Active Inactive Necrotic Tissue Nursing Diagnoses: Impaired tissue integrity related to necrotic/devitalized tissue Knowledge deficit related to management of necrotic/devitalized tissue Goals: Necrotic/devitalized tissue will be minimized in the wound bed Date Initiated: 04/07/2021 Target Resolution Date: 09/08/2021 Goal Status: Active Patient/caregiver will verbalize understanding of reason and process for debridement of necrotic tissue Date Initiated: 04/07/2021 Target Resolution  Date: 09/08/2021 Goal Status: Active Interventions: Assess patient pain level pre-, during and post procedure and prior to discharge Provide education on necrotic tissue and debridement process Treatment Activities: Excisional debridement : 04/07/2021 Notes: Electronic Signature(s) Signed: 09/08/2021 4:58:16 PM By: Gretta Cool, BSN, RN, CWS, [name] RN, BSN Entered By: Gretta Cool, BSN, RN, CWS, [name] on 09/08/2021 16:23:44 [name] ([id number]) -------------------------------------------------------------------------------- Pain Assessment Details Patient Name: Heather Dear A. Date of Service: 09/08/2021 3:30 PM Medical Record Number: [id number] Patient Account Number: [id number] Date of Birth/Sex: [birth year] (45 y.o. F) Treating RN: Cornell Barman Primary Care Rafi Kenneth: Raelene Bott Other Clinician: Referring Alina Gilkey: Raelene Bott Treating Hally Colella/Extender: Yaakov Guthrie in Treatment: 24 Active Problems Location of Pain Severity and Description of Pain Patient Has Paino Yes Site Locations Pain Location: Pain in Ulcers Rate the pain. Current Pain Level: 7 Pain Management and Medication Current Pain Management: Notes pain in left foot Electronic Signature(s) Signed: 09/08/2021 4:58:16 PM By: Gretta Cool, BSN, RN, CWS, [name] RN, BSN Entered By: Gretta Cool, BSN, RN, CWS, [name] on 09/08/2021 15:47:38 [name] ([id number]) -------------------------------------------------------------------------------- Patient/Caregiver Education Details Patient Name: Heather Dear A. Date of Service: 09/08/2021 3:30 PM Medical Record Number: [id number] Patient Account Number:  [id number] Date of Birth/Gender: 10-13-1976 (45 y.o. F) Treating RN: Cornell Barman Primary Care Physician: Raelene Bott Other Clinician: Referring Physician: Raelene Bott Treating Physician/Extender: Yaakov Guthrie in Treatment: 24 Education Assessment Education Provided To: Patient Education Topics  Provided Infection: Handouts: Infection Prevention and Management, Other: take antibiotics as prescribed Methods: Explain/Verbal Responses: State content correctly Pressure: Handouts: Pressure Ulcers: Care and Offloading, Other: Wear offloading shoes every day! Methods: Explain/Verbal Responses: State content correctly Safety: Wound Debridement: Handouts: Wound Debridement Methods: Demonstration, Explain/Verbal Responses: State content correctly Wound/Skin Impairment: Handouts: Other: Keep dressings on all wounds. Do not leave open to air. Electronic Signature(s) Signed: 09/08/2021 4:58:16 PM By: Gretta Cool, BSN, RN, CWS, [name] RN, BSN Entered By: Gretta Cool, BSN, RN, CWS, [name] on 09/08/2021 16:56:35 [name] ([id number]) -------------------------------------------------------------------------------- Wound Assessment Details Patient Name: Heather Dear A. Date of Service: 09/08/2021 3:30 PM Medical Record Number: [id number] Patient Account Number: [id number] Date of Birth/Sex: 09-04-1976 (45 y.o. F) Treating RN: Cornell Barman Primary Care Eura Mccauslin: Raelene Bott Other Clinician: Referring Jakori Burkett: Raelene Bott Treating Myrella Fahs/Extender: Yaakov Guthrie in Treatment: 24 Wound Status Wound Number: 7 Primary Diabetic Wound/Ulcer of the Lower Extremity Etiology: Wound Location: Left, Midline, Plantar Foot Wound Open Wounding Event: Gradually Appeared Status: Date Acquired: 08/07/2020 Comorbid Chronic sinus problems/congestion, Middle ear problems, Weeks Of Treatment: 24 History: Anemia, Chronic Obstructive Pulmonary Disease (COPD), Clustered Wound: No Congestive Heart Failure, Type II Diabetes, End Stage Renal Disease, History of pressure wounds, Neuropathy Photos Wound Measurements Length: (cm) 6.4 Width: (cm) 4 Depth: (cm) 0.5 Area: (cm) 20.106 Volume: (cm) 10.053 % Reduction in Area: -653% % Reduction in Volume: -653% Epithelialization: None Tunneling:  No Undermining: No Wound Description Classification: Grade 2 Fo Wound Margin: Thickened Sl Exudate Amount: Medium Exudate Type: Serosanguineous Exudate Color: red, brown ul Odor After Cleansing: No ough/Fibrino Yes Wound Bed Granulation Amount: Small (1-33%) Exposed Structure Granulation Quality: Red, Pink, Hyper-granulation Fascia Exposed: No Necrotic Amount: Large (67-100%) Fat Layer (Subcutaneous Tissue) Exposed: Yes Necrotic Quality: Eschar, Adherent Slough Tendon Exposed: No Muscle Exposed: No Joint Exposed: No Bone Exposed: No Assessment Notes Dog hair all through-out the wound. Treatment Notes Wound #7 (Foot) Wound Laterality: Plantar, Left, Midline Cleanser [name], [name] ([id number]) Dakin 16 (oz) 0.25 Discharge Instruction: Use as directed. Peri-Wound Care Topical Primary Dressing Gauze Discharge Instruction: As directed: moistened with Dakins Solution Secondary Dressing Secured With Kerlix Roll Sterile or Non-Sterile 6-ply 4.5x4 (yd/yd) Discharge Instruction: Apply Kerlix as directed Compression Wrap Compression Stockings Add-Ons Electronic Signature(s) Signed: 09/08/2021 4:58:16 PM By: Gretta Cool, BSN, RN, CWS, [name] RN, BSN Entered By: Gretta Cool, BSN, RN, CWS, [name] on 09/08/2021 15:58:25 [name] ([id number]) -------------------------------------------------------------------------------- Wound Assessment Details Patient Name: Heather Dear A. Date of Service: 09/08/2021 3:30 PM Medical Record Number: [id number] Patient Account Number: [id number] Date of Birth/Sex: 1976/03/09 (45 y.o. F) Treating RN: Cornell Barman Primary Care Markiyah Gahm: Raelene Bott Other Clinician: Referring Arshad Oberholzer: Raelene Bott Treating Lamyia Cdebaca/Extender: Yaakov Guthrie in Treatment: 24 Wound Status Wound Number: 8 Primary Diabetic Wound/Ulcer of the Lower Extremity Etiology: Wound Location: Right, Anterior Lower Leg Wound Open Wounding Event: Gradually  Appeared Status: Date Acquired: 06/30/2021 Comorbid Chronic sinus problems/congestion, Middle ear problems, Weeks Of Treatment: 10 History: Anemia, Chronic Obstructive Pulmonary Disease (COPD), Clustered Wound: No Congestive Heart Failure, Type II Diabetes, End Stage Renal Disease, History of pressure wounds, Neuropathy Photos Wound Measurements Length: (cm) 2.5 % Re Width: (cm) 1.5 % Re Depth: (cm) 0.1 Epit Area: (cm) 2.945 Tun Volume: (cm) 0.295 Und duction in Area:  53.1% duction in Volume: 53% helialization: None neling: No ermining: No Wound Description Classification: Grade 1 Foul Wound Margin: Flat and Intact Slou Exudate Amount: None Present Odor After Cleansing: No gh/Fibrino No Wound Bed Granulation Amount: None Present (0%) Exposed Structure Necrotic Amount: Large (67-100%) Fascia Exposed: No Necrotic Quality: Eschar Fat Layer (Subcutaneous Tissue) Exposed: Yes Tendon Exposed: No Muscle Exposed: No Joint Exposed: No Bone Exposed: No Assessment Notes Scab covered, no dressing on wound. Treatment Notes Wound #8 (Lower Leg) Wound Laterality: Right, Anterior Cleanser Soap and Water [name], [name] ([id number]) Discharge Instruction: Gently cleanse wound with antibacterial soap, rinse and pat dry prior to dressing wounds Peri-Wound Care Topical Bacitracin Ointment, 1 (oz) tube Primary Dressing Secondary Dressing Secured With Compression Wrap Compression Stockings Add-Ons Curity Flexible Adhesive Bandage, Rachelle Hora, 2x3.25 (in/in) Discharge Instruction: 2x3.25 bandage Electronic Signature(s) Signed: 09/08/2021 4:58:16 PM By: Gretta Cool, BSN, RN, CWS, [name] RN, BSN Entered By: Gretta Cool, BSN, RN, CWS, [name] on 09/08/2021 15:59:23 [name] ([id number]) -------------------------------------------------------------------------------- Wound Assessment Details Patient Name: Heather Dear A. Date of Service: 09/08/2021 3:30 PM Medical Record Number:  [id number] Patient Account Number: [id number] Date of Birth/Sex: [birth year] (45 y.o. F) Treating RN: Cornell Barman Primary Care Trevin Gartrell: Raelene Bott Other Clinician: Referring Takisha Pelle: Raelene Bott Treating Lailie Smead/Extender: Yaakov Guthrie in Treatment: 24 Wound Status Wound Number: 9 Primary Diabetic Wound/Ulcer of the Lower Extremity Etiology: Wound Location: Right Calcaneus Wound Open Wounding Event: Gradually Appeared Status: Date Acquired: 07/18/2021 Comorbid Chronic sinus problems/congestion, Middle ear problems, Weeks Of Treatment: 7 History: Anemia, Chronic Obstructive Pulmonary Disease (COPD), Clustered Wound: No Congestive Heart Failure, Type II Diabetes, End Stage Renal Disease, History of pressure wounds, Neuropathy Photos Wound Measurements Length: (cm) 0.5 Width: (cm) 0.3 Depth: (cm) 0.4 Area: (cm) 0.118 Volume: (cm) 0.047 % Reduction in Area: 64.2% % Reduction in Volume: 79.7% Epithelialization: Large (67-100%) Wound Description Classification: Grade 1 Wound Margin: Flat and Intact Exudate Amount: Medium Exudate Type: Serosanguineous Exudate Color: red, brown Foul Odor After Cleansing: No Slough/Fibrino Yes Wound Bed Granulation Amount: Medium (34-66%) Exposed Structure Necrotic Amount: Medium (34-66%) Fascia Exposed: No Necrotic Quality: Adherent Slough Fat Layer (Subcutaneous Tissue) Exposed: Yes Tendon Exposed: No Muscle Exposed: No Joint Exposed: No Bone Exposed: No Treatment Notes Wound #9 (Calcaneus) Wound Laterality: Right Cleanser Soap and Water Discharge Instruction: Gently cleanse wound with antibacterial soap, rinse and pat dry prior to dressing wounds [name], [name] ([id number]) Peri-Wound Care Topical Primary Dressing Hydrofera Blue Ready Transfer Foam, 4x5 (in/in) Discharge Instruction: Apply Hydrofera Blue Ready to wound bed as directed Secondary Dressing Secured With Compression Wrap Compression  Stockings Add-Ons Curity Flexible Adhesive Bandage, Xlarge, 2x3.25 (in/in) Discharge Instruction: 2x3.25 bandage Electronic Signature(s) Signed: 09/08/2021 4:58:16 PM By: Gretta Cool, BSN, RN, CWS, [name] RN, BSN Entered By: Gretta Cool, BSN, RN, CWS, [name] on 09/08/2021 16:00:18 [name] ([id number]) -------------------------------------------------------------------------------- [place] Details Patient Name: Heather Dear A. Date of Service: 09/08/2021 3:30 PM Medical Record Number: [id number] Patient Account Number: [id number] Date of Birth/Sex: 04-01-1976 (45 y.o. F) Treating RN: Cornell Barman Primary Care Trinette Vera: Raelene Bott Other Clinician: Referring Maahir Horst: Raelene Bott Treating Kimbley Sprague/Extender: Yaakov Guthrie in Treatment: 24 Vital Signs Time Taken: 15:46 Temperature (F): 99.2 Height (in): 69 Pulse (bpm): 134 Weight (lbs): 185 Respiratory Rate (breaths/min): 18 Body Mass Index (BMI): 27.3 Blood Pressure (mmHg): 100/70 Reference Range: 80 - 120 mg / dl Electronic Signature(s) Signed: 09/08/2021 4:58:16 PM By: Gretta Cool, BSN, RN, CWS, [name] RN, BSN Entered By: Gretta Cool, BSN, RN, CWS, [name] on 09/08/2021 15:47:18

## 2021-09-15 ENCOUNTER — Encounter (HOSPITAL_BASED_OUTPATIENT_CLINIC_OR_DEPARTMENT_OTHER): Payer: Medicaid Other | Admitting: Internal Medicine

## 2021-09-15 ENCOUNTER — Other Ambulatory Visit: Payer: Self-pay

## 2021-09-15 DIAGNOSIS — L97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ity: Secondary | ICD-10-CM | POA: Diagnosis not present

## 2021-09-15 DIAGNOSIS — L97512 Non-pressure chronic ulcer of other part of right foot with fat layer exposed: Secondary | ICD-10-CM

## 2021-09-15 DIAGNOSIS — E11621 Type 2 diabetes mellitus with foot ulcer: Secondary | ICD-10-CM

## 2021-09-15 NOTE — Progress Notes (Signed)
[name], [name] ([id number]) Visit Report for 09/15/2021 Chief Complaint Document Details Patient Name: [name], [name] Date of Service: 09/15/2021 8:45 AM Medical Record Number: [id number] Patient Account Number: [id number] Date of Birth/Sex: 11/04/1975 (45 y.o. F) Treating RN: Cornell Barman Primary Care Provider: Raelene Bott Other Clinician: Referring Provider: Raelene Bott Treating Provider/Extender: Yaakov Guthrie in Treatment: 25 Information Obtained from: Patient Chief Complaint 03/19/2021; patient referred by Dr. Luana Shu who has been looking after her left foot for quite a period of time for review of a nonhealing area in the left midfoot Electronic Signature(s) Signed: 09/15/2021 9:49:29 AM By: Kalman Shan DO Entered By: Kalman Shan on 09/15/2021 09:43:30 Mccomas, Novie A. ([id number]) -------------------------------------------------------------------------------- Debridement Details Patient Name: Heather Dear A. Date of Service: 09/15/2021 8:45 AM Medical Record Number: [id number] Patient Account Number: [id number] Date of Birth/Sex: May 30, 1976 (45 y.o. F) Treating RN: Cornell Barman Primary Care Provider: Raelene Bott Other Clinician: Referring Provider: Raelene Bott Treating Provider/Extender: Yaakov Guthrie in Treatment: 25 Debridement Performed for Wound #7 Left,Midline,Plantar Foot Assessment: Performed By: Physician Kalman Shan, MD Debridement Type: Debridement Severity of Tissue Pre Debridement: Fat layer exposed Level of Consciousness (Pre- Awake and Alert procedure): Pre-procedure Verification/Time Out Yes - 09:31 Taken: Total Area Debrided (L x W): 4 (cm) x 3 (cm) = 12 (cm) Tissue and other material Viable, Non-Viable, Slough, Subcutaneous, Biofilm, Slough debrided: Level: Skin/Subcutaneous Tissue Debridement Description: Excisional Instrument: Curette Bleeding: Moderate Hemostasis Achieved: Pressure Response to  Treatment: Procedure was tolerated well Level of Consciousness (Post- Awake and Alert procedure): Post Debridement Measurements of Total Wound Length: (cm) 5.5 Width: (cm) 3.5 Depth: (cm) 0.6 Volume: (cm) 9.071 Character of Wound/Ulcer Post Debridement: Stable Severity of Tissue Post Debridement: Fat layer exposed Post Procedure Diagnosis Same as Pre-procedure Electronic Signature(s) Signed: 09/15/2021 9:49:29 AM By: Kalman Shan DO Signed: 09/15/2021 3:24:52 PM By: Gretta Cool, BSN, RN, CWS, [name] RN, BSN Entered By: Gretta Cool, BSN, RN, CWS, [name] on 09/15/2021 09:33:31 [name], [name] ([id number]) -------------------------------------------------------------------------------- Debridement Details Patient Name: Heather Dear A. Date of Service: 09/15/2021 8:45 AM Medical Record Number: [id number] Patient Account Number: [id number] Date of Birth/Sex: October 24, 1976 (45 y.o. F) Treating RN: Cornell Barman Primary Care Provider: Raelene Bott Other Clinician: Referring Provider: Raelene Bott Treating Provider/Extender: Yaakov Guthrie in Treatment: 25 Debridement Performed for Wound #9 Right Calcaneus Assessment: Performed By: Physician Kalman Shan, MD Debridement Type: Debridement Severity of Tissue Pre Debridement: Fat layer exposed Level of Consciousness (Pre- Awake and Alert procedure): Pre-procedure Verification/Time Out Yes - 09:31 Taken: Total Area Debrided (L x W): 0.4 (cm) x 0.3 (cm) = 0.12 (cm) Tissue and other material Viable, Non-Viable, Callus, Subcutaneous debrided: Level: Skin/Subcutaneous Tissue Debridement Description: Excisional Instrument: Curette Bleeding: Moderate Hemostasis Achieved: Pressure Response to Treatment: Procedure was tolerated well Level of Consciousness (Post- Awake and Alert procedure): Post Debridement Measurements of Total Wound Length: (cm) 0.5 Width: (cm) 0.4 Depth: (cm) 0.5 Volume: (cm) 0.079 Character of Wound/Ulcer  Post Debridement: Stable Severity of Tissue Post Debridement: Fat layer exposed Post Procedure Diagnosis Same as Pre-procedure Electronic Signature(s) Signed: 09/15/2021 9:49:29 AM By: Kalman Shan DO Signed: 09/15/2021 3:24:52 PM By: Gretta Cool, BSN, RN, CWS, [name] RN, BSN Entered By: Gretta Cool, BSN, RN, CWS, [name] on 09/15/2021 09:36:16 [name], [name] ([id number]) -------------------------------------------------------------------------------- HPI Details Patient Name: Heather Dear A. Date of Service: 09/15/2021 8:45 AM Medical Record Number: [id number] Patient Account Number: [id number] Date of Birth/Sex: September 02, 1976 (45 y.o. F) Treating RN: Cornell Barman Primary Care Provider: Raelene Bott Other Clinician: Referring Provider: Raelene Bott  Treating Provider/Extender: Yaakov Guthrie in Treatment: 25 History of Present Illness HPI Description: 01/18/18-She is here for initial evaluation of the left great toe ulcer. She is a poor historian in regards to timeframe in detail. She states approximately 4 weeks ago she lacerated her toe on something in the house. She followed up with her primary care who placed her on Bactrim and ultimately a second dose of Bactrim prior to coming to wound clinic. She states she has been treating the toe with peroxide, Betadine and a Band-Aid. She did not check her blood sugar this morning but checked it yesterday morning it was 327; she is unaware of a recent A1c and there are no current records. She saw Dr. she would've orthopedics last week for an old injury to the left ankle, she states he did not see her toe, nor did she bring it to his attention. She smokes approximately 1 pack cigarettes a day. Her social situation is concerning, she arrives this morning with her mother who appears extremely intoxicated/under the influence; her mother was asked to leave the room and be monitored by the patient's grandmother. The patient's aunt then accompanied the  patient and the room throughout the rest of the appointment. We had a lengthy discussion regarding the deleterious effects of uncontrolled hyperglycemia and smoking as it relates to wound healing and overall health. She was strongly encouraged to decrease her smoking and get her diabetes under better control. She states she is currently on a diet and has cut down her [facility] consumption. The left toe is erythematous, macerated and slightly edematous with malodor present. The edema in her left foot is below her baseline, there is no erythema streaking. We will treat her with Santyl, doxycycline; we have ordered and xray, culture and provided a Peg assist surgical shoe and cultured the wound. 01/25/18-She is here in follow-up evaluation for a left great toe ulcer and presents with an abscess to her suprapubic area. She states her blood sugars remain elevated, feeling "sick" and if levels are below 250, but she is trying. She has made no attempt to decrease her smoking stating that we "can't take away her food in her cigarettes". She has been compliant with offloading using the PEG assist you. She is using Santyl daily. the culture obtained last week grew staph aureus and Enterococcus faecalis; continues on the doxycycline and Augmentin was added on Monday. The suprapubic area has erythema, no femoral variation, purple discoloration, minimal induration, was accessed with a cotton tip applicator with sanguinopurulent drainage, this was cultured, I suspect the current antibiotic treatment will cover and we will not add anything to her current treatment plan. She was advised to go to urgent care or ER with any change in redness, induration or fever. 02/01/18-She is here in follow-up evaluation for left great toe ulcers and a new abdominal abscess from last week. She was able to use packing until earlier this week, where she "forgot it was there". She states she was feeling ill with GI symptoms last week  and was not taking her antibiotic. She states her glucose levels have been predominantly less than 200, with occasional levels between 200-250. She thinks this was contributing to her GI symptoms as they have resolved without intervention. There continues to be significant laceration to left toe, otherwise it clinically looks stable/improved. There is now less superficial opening to the lateral aspect of the great toe that was residual blister. We will transition to [facility] to all wounds,  she will continue her Augmentin. If there is no change or deterioration next week for reculture. 02/08/18-She is here in follow-up evaluation for left great toe ulcer and abdominal ulcer. There is an improvement in both wounds. She has been wrapping her left toe with coban, not by our direction, which has created an area of discoloration to the medial aspect; she has been advised to NOT use coban secondary to her neuropathy. She states her glucose levels have been high over this last week ranging from 200-350, she continues to smoke. She admits to being less compliant with her offloading shoe. We will continue with same treatment plan and she will follow-up next week. 02/15/18-She is here in follow-up evaluation for left great toe ulcer and abdominal ulcer. The abdominal ulcer is epithelialized. The left great toe ulcer is improved and all injury from last week using the Coban wrap is resolved, the lateral ulcer is healed. She admits to noncompliance with wearing offloading shoe and admits to glucose levels being greater than 300 most of the week. She continues to smoke and expresses no desire to quit. There is one area medially that probes deeper than it has historically, erythema to the toe and dorsal foot has consistently waxed and waned. There is no overt signs of cellulitis or infection but we will culture the wound for any occult infection given the new area of depth and erythema. We will hold off on  sensitivities for initiation of antibiotic therapy. 02/22/18-She is here in follow up evaluation for left great toe ulcer. There is overall significant improvement in both wound appearance, erythema and edema with changes made last week. She was not initiated on antibiotic therapy. Culture obtained last week showed oxacillin sensitive staph aureus, sensitive to clindamycin. Clindamycin has been called into the pharmacy but she has been instructed to hold off on initiation secondary to overall clinical improvement and her history of antibiotic intolerance. She has been instructed to contact the clinic with any noted changes/deterioration and the wound, erythema, edema and/or pain. She will follow-up next week. She continues to smoke and her glucose levels remain elevated >250; she admits to compliance with offloading shoe 03/01/18 on evaluation today patient appears to be doing fairly well in regard to her left first toe ulcer. She has been tolerating the dressing changes with the [facility] Dressing without complication and overall this has definitely showed signs of improvement according to records as well is what the patient tells me today. I'm very pleased in that regard. She is having no pain today 03/08/18 She is here for follow up evaluation of a left great toe ulcer. She remains non-compliant with glucose control and smoking cessation; glucose levels consistently >200. She states that she got new shoe inserts/peg assist. She admits to compliance with offloading. Since my last evaluation there is significant improvement. We will switch to prisma at this time and she will follow up next week. She is noted to be tachycardic at this appointment, heart rate 120s; she has a history of heart rate 70-130 according to our records. She admits to extreme agitation r/t personal issues; she was advised to monitor her heartrate and contact her physician if it does not return to a more normal range (<100).  She takes cardizem twice daily. 03/15/18-She is here in follow-up evaluation for left great toe ulcer. She remains noncompliant with glucose control and smoking cessation. She admits to compliance with wearing offloading shoe. The ulcer is improved/stable and we will continue with the same  treatment plan and she will follow-up next week 03/22/18-She is here for evaluation for left great toe ulcer. There continues to be significant improvement despite recurrent hyperglycemia (over 500 yesterday) and she continues to smoke. She has been compliant with offloading and we will continue with same treatment plan and she will follow-up next week. 03/29/18-She is here for evaluation for left great toe ulcer. Despite continuing to smoke and uncontrolled diabetes she continues to improve. She is compliant with offloading shoe. We will continue with the same treatment plan and she will follow-up next week 04/05/18- She is here in follow up evaluation for a left great toe ulcer; she presents with small pustule to left fifth toe (resembles ant bite). She admits to compliance with wearing offloading shoe; continues to smoke or have uncontrolled blood glucose control. There is more callus than usual with evidence of bleeding; she denies known trauma. 04/12/18-She is here for evaluation of left great toe ulcer. Despite noncompliance with glycemic control and smoking she continues to make Ketcham, Armida A. ([id number]) improvement. She continues to wear offloading shoe. The pustule, that was identified last week, to the left fifth toe is resolved. She will follow-up in 2 weeks 05/03/18-she is seen in follow-up evaluation for a left great toe ulcer. She is compliant with offloading, otherwise noncompliant with glycemic control and smoking. She has plateaued and there is minimal improvement noted. We will transition to [facility], replaced the insert to her surgical shoe and she will follow-up in one week 05/10/18- She  is here in follow up evaluation for a left great toe ulcer. It appears stable despite measurement change. We will continue with same treatment plan and follow up next week. 05/24/18-She is seen in follow-up evaluation for a left great toe ulcer. She remains compliant with offloading, has made significant improvement in her diet, decreasing the amount of sugar/soda. She said her recent A1c was 10.9 which is lower than. She did see a diabetic nutritionist/educator yesterday. She continues to smoke. We will continue with the same treatment plan and she'll follow-up next week. 05/31/18- She is seen in follow-up evaluation for left great toe ulcer. She continues to remain compliant with offloading, continues to make improvement in her diet, increasing her water and decreasing the amount of sugar/soda. She does continue to smoke with no desire to quit. We will apply Prisma to the depth and Hydrofera Blue over. We have not received insurance authorization for oasis. She will follow up next week. 06/07/18-She is seen in follow-up evaluation for left great toe ulcer. It has stalled according to today's measurements although base appears stable. She says she saw a diabetic educator yesterday; her average blood sugars are less than 300 which is an improvement for her. She continues to smoke and states "that's my next step" She continues with water over soda. We will order for xray, culture and reinstate ace wrap compression prior to placing apligraf for next week. She is voicing no complaints or concerns. Her dressing will change to iodoflex over the next week in preparation for apligraf. 06/14/18-She is seen in follow-up evaluation for left great toe ulcer. Plain film x-ray performed last week was negative for osteomyelitis. Wound culture obtained last week grew strep B and OSSA; she is initiated on keflex and cefdinir today; there is erythema to the toe which could be from ace wrap compression, she has a history of  wrapping too tight and has has been encouraged to maintain ace wraps that we place today. We will  hold off on application of apligraf today, will apply next week after antibiotic therapy has been initiated. She admits today that she has resumed taking a shower with her foot/toe submerged in water, she has been reminded to keep foot/toe out of the bath water. She will be seen in follow up next week 06/21/18-she is seen in follow-up evaluation for left great toe ulcer. She is tolerating antibiotic therapy with no GI disturbance. The wound is stable. Apligraf was applied today. She has been decreasing her smoking, only had 4 cigarettes yesterday and 1 today. She continues being more compliant in diabetic diet. She will follow-up next week for evaluation of site, if stable will remove at 2 weeks. 06/28/18- She is here in follow up evalution. Apligraf was placed last week, she states the dressing fell off on Tuesday and she was dressing with hydrofera blue. She is healed and will be discharged from the clinic today. She has been instructed to continue with smoking cessation, continue monitoring glucose levels, offloading for an additional 4 weeks and continue with hydrofera blue for additional two weeks for any possible microscopic opening. Readmission: 08/07/18 on evaluation today patient presents for reevaluation concerning the ulcer of her right great toe. She was previously discharged on 06/28/18 healed. Nonetheless she states that this began to show signs of drainage she subsequently went to her primary care provider. Subsequently an x-ray was performed on 08/01/18 which was negative. The patient was also placed on antibiotics at that time. Fortunately they should have been effective for the infection. Nonetheless she's been experiencing some improvement but still has a lot of drainage coming from the wound itself. 08/14/18 on evaluation today patient's wound actually does show signs of improvement in  regard to the erythema at this point. She has completed the antibiotics. With that being said we did discuss the possibility of placing her in a total contact cast as of today although I think that I may want to give this just a little bit more time to ensure nothing recurrence as far as her infection is concerned. I do not want to put in the cast and risk infection at that time if things are not completely resolved. With that being said she is gonna require some debridement today. 08/21/18 on evaluation today patient actually appears to be doing okay in regard to her toe ulcer. She's been tolerating the dressing changes without complication. With that being said it does appear that she is ready and in fact I think it's appropriate for [place] to go ahead and initiate the total contact cast today. Nonetheless she will require some sharp debridement to prepare the wound for application. Overall I feel like things have been progressing well but we do need to do something to get this to close more readily. 08/24/18 patient seen today for reevaluation after having had the total contact cast applied on Tuesday. She seems to have done very well the wound appears to be doing great and overall I'm pleased with the progress that she's made. There were no abnormal areas of rubbing from the cast on her lower extremity. 08/30/18 on evaluation today patient actually appears to be completely healed in regard to her plantar toe ulcer. She tells me at this point she's been having a lot of issues with the cast. She almost fell a couple of times the state shall the step of her dog a couple times as well. This is been a very frustrating process for her other nonetheless she has completely  healed the wound which is excellent news. Overall there does not appear to be the evidence of infection at this time which is great news. 09/11/18 evaluation today patient presents for follow-up concerning her great toe ulcer on the left  which has unfortunately reopened since I last saw her which was only a couple of weeks ago. Unfortunately she was not able to get in to get the shoe and potentially the AFO that's gonna be necessary due to her left foot drop. She continues with offloading shoe but this is not enough to prevent her from reopening it appears. When we last had her in the total contact cast she did well from a healing standpoint but unfortunately the wound reopened as soon as she came out of the cast within just a couple of weeks. 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 This is also part of the reason why she falls on a regular basis. I just do not believe that is gonna be safe for [place] to reinitiate the total contact cast as last time we had this on she fell 3 times one week which is definitely not normal for her. 09/18/18 upon evaluation today the patient actually appears to be doing about the same in regard to her toe ulcer. She did not contact [employer] as I asked her to even though I had given her the prescription. In fact she actually states that she has no idea where the prescription is. She did apparently call [employer] and they told her that all she needed to do was bring the prescription in order to be able to be seen and work on getting the AFO for her left foot. With all that being said she still does not have an appointment and I'm not sure were things stand that regard. I will give her a new prescription today in order to contact them to get this set up. 09/25/18 on evaluation today patient actually appears to be doing about the same in regard to her toes ulcer. She does have a small areas which seems to have a lot of callous buildup around the edge of the wound which is going to need sharp debridement today. She  still is waiting to be scheduled for evaluation with [employer] for possibility of an AFO. She states there supposed to call her tomorrow to get this set up. Unfortunately it does appear that her foot specifically the toe area is showing signs of erythema. There does not appear to be any systemic infection which is in these good news. [name], [name] ([id number]) 10/02/18 on evaluation today patient actually appears to be doing about the same in regard to her toe ulcer. This really has not done too well although it's not significantly larger it's also not significantly smaller. She has been tolerating the dressing changes without complication. She actually has her appointment with [facility] and [place] tomorrow to hopefully be measured for obtaining and AFO splint. I think this would be helpful preventing this from reoccurring. We had contemplated starting the cast this week although to be honest I am reluctant to do that as she's been having nausea, vomiting, and seizure activity over the past three days. She has a history of seizures and have been told is nothing that can be done for  these. With that being said I do believe that along with the seizures have the nausea vomiting which upon further questioning doesn't seem to be the normal for her and makes me concerned for the possibility of infection or something else going on. I discussed this with the patient and her mother during the office visit today. I do not feel the wound is effective but maybe something else. The responses this was "this just happens to her at times and we don't know why". They did not seem to be interested in going to the hospital to have this checked out further. 10/09/18 on evaluation today patient presents for follow-up concerning her ongoing toe ulcer. She has been tolerating the dressing changes without complication. Fortunately there does not appear to be any evidence of infection which is great news however I do think  that the patient would benefit from going ahead for with the total contact cast. She's actually in a wheelchair today she tells me that she will use her walker if we initiate the cast. I was very specific about the fact that if we were gonna do the cast I wanted to make sure that she was using the walker in order to prevent any falls. She tells me she does not have stairs that she has to traverse on a regular basis at her home. She has not had any seizures since last week again that something that happens to her often she tells me she did talk to [employer] and they said that it may take up to three weeks to get the brace approved for her. Hopefully that will not take that long but nonetheless in the meantime I do think the cast could be of benefit. 10/12/18 on evaluation today patient appears to be doing rather well in regard to her toe ulcer. It's just been a few days and already this is significantly improved both as far as overall appearance and size. Fortunately there's no sign of infection. She is here for her first obligatory cast change. 10/19/18 Seen today for follow up and management of left great toe ulcer. Wound continues to show improvement. Noted small open area with seroussang drainage with palpation. Denies any increased pain or recent fevers during visit. She will continue calcium alginate with offloading shoe. Denies any questions or concerns during visit. 10/26/18 on evaluation today patient appears to be doing about the same as when I last saw her in regard to her wound bed. Fortunately there does not appear to be any signs of infection. Unfortunately she continues to have a breakdown in regard to the toe region any time that she is not in the cast. It takes almost no time at all for this to happen. Nonetheless she still has not heard anything from the brace being made by [employer] as to when exactly this will be available to her. Fortunately there is no signs of infection at this  time. 10/30/18 on evaluation today patient presents for application of the total contact cast as we just received him this morning. Fortunately we are gonna be able to apply this to her today which is great news. She continues to have no significant pain which is good news. Overall I do feel like things have been improving while she was the cast is when she doesn't have a cast that things get worse. She still has not really heard anything from [place] regarding her brace. 11/02/18 upon evaluation today patient's wound already appears to be doing significantly better which is  good news. Fortunately there does not appear to be any signs of infection also good news. Overall I do think the total contact cast as before is helping to heal this area unfortunately it's just not gonna likely keep the area closed and healed without her getting her brace at least. Again the foot drop is a significant issue for her. 11/09/18 on evaluation today patient appears to be doing excellent in regard to her toe ulcer which in fact is completely healed. Fortunately we finally got the situation squared away with the paperwork which was needed to proceed with getting her brace approved by Medicaid. I have filled that out unfortunately that information has been sent to the orthopedic office that I worked at 2 1/2 years ago and not tired Current wound care measures. Fortunately she seems to be doing very well at this time. 11/23/18 on evaluation today patient appears to be doing More Poorly Compared to Last Time I Saw Her. At [facility] She Had Completely Healed. Currently she is continuing to have issues with reopening. She states that she just found out that the brace was approved through Medicaid now she just has to go get measured in order to have this fitted for her and then made. Subsequently she does not have an appointment for this yet that is going to complicate things we obviously cannot put her back in the cast if we do  not have everything measured because they're not gonna be able to measure her foot while she is in the cast. 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 Obviously this is unfortunate and does have me somewhat worried as well. 11/30/18 on evaluation today patient's toe ulcer actually appears to be doing fairly well. The good news is she will be getting her brace in the shoes next week on Wednesday. Hopefully we will be able to get this to heal without having to go back in the cast however she may need the cast in order to get the wound completely heal and then go from there. Fortunately there's no signs of infection at this time. 12/07/18 on evaluation today patient fortunately did receive her brace and she states she could tell this definitely makes her walk better. With that being said she's been having issues with her toe where she noticed yesterday there was a lot of tissue that was loosing off this appears to be much larger than what it was previous. She also states that her leg has been read putting much across the top of her foot just about the ankle although this seems to be receiving somewhat. The total area is still red and appears to be someone infected as best I can tell. She is previously taken Bactrim and that may be a good option for her today as well. We are gonna see what I wound culture shows as well and I think that this is definitely appropriate. With that being said outside of the culture I still need to initiate something in the interim and that's what I'm gonna go ahead and select Bactrim is a good option for her. 12/14/18 on evaluation today patient appears to be doing better in regard to her left great toe ulcer as compared to last week's evaluation. There's still some erythema although this is significantly improved which is excellent news. Overall I do believe that she is making good  progress is  still gonna take some time before she is where I would like her to be from the standpoint of being able to place her back into the total contact cast. Hopefully we will be where we need to be by next week. 12/21/18 on evaluation today patient actually appears to be doing poorly in regard to her toe ulcer. She's been tolerating the dressing changes without complication. Fortunately there's no signs of systemic infection although she does have a lot of drainage from the toe ulcer and this does seem to be causing some issues at this point. She does have erythema on the distal portion of her toe that appears to be likely cellulitis. 12/28/18 on evaluation today patient actually appears to be doing a little better in my pinion in regard to her toe ulcer. With that being said she still does have some evidence of infection at this time and for her culture she had both E. coli as well as enterococcus as organisms noted on evaluation. For that reason I think that though the Keflex likely has treated the E. coli rather well this has really done nothing for the enterococcus. We are going to have to initiate treatment for this specifically. [name], [name] ([id number]) 01/04/19 on evaluation today patient's toe actually appears to be doing better from the standpoint of infection. She currently would like to see about putting the cash back on I think that this is appropriate as long as she takes care of it and keeps it from getting wet. She is gonna have some drainage we can definitely pass this up with Drawtex and alginate to try to prevent as much drainage as possible from causing the problems. With that being said I do want to at least try her with the cast between now and Tuesday. If there any issues we can't continue to use it then I will discontinue the use of the cast at that point. 01/08/19 on evaluation today patient actually appears to be doing very well as far as her foot ulcer specifically the  great toe on the left is concerned. She did have an area of rubbing on the medial aspect of her left ankle which again is from the cast. Fortunately there's no signs of infection at this point in this appears to be a very slight skin breakdown. The patient tells me she felt it rubbing but didn't think it was that bad. Fortunately there is no signs of active infection at this time which is good news. No fevers, chills, nausea, or vomiting noted at this time. 01/15/19 on evaluation today patient actually appears to be doing well in regard to her toe ulcer. Again as previous she seems to do well and she has the cast on which indicates to me that during the time she doesn't have a cast on she's putting way too much pressure on this region. Obviously I think that's gonna be an issue as with the current national emergency concerning the Covid-19 Virus it has been recommended that we discontinue the use of total contact casting by the chief medical officer of our company, Dr. Simona Huh. The reasoning is that if a patient becomes sick and cannot come into have the cast removed they could not just leave this on for an additional two weeks. Obviously the hospitals also do not want to receive patient's who are sick into the emergency department to potentially contaminate the region and spread the Covid-19 Virus among other sick individuals within the hospital system. Therefore at this point  we are suspending the use of total contact cast until the current emergency subsides. This was all discussed with the patient today as well. 01/22/19 on evaluation today patient's wound on her left great toe appears to be doing slightly worse than previously noted last week. She tells me that she has been on this quite a bit in fact she tells me she's been awake for 38 straight hours. This is due to the fact that she's having to care for grandparents because nobody else will. She has been taking care of them for five the last seven  days since I've seen her they both have dementia his is from a stroke and her grandmother's was progressive. Nonetheless she states even her mom who knows her condition and situation has only help two of those days to take care of them she's been taking care of the rest. Fortunately there does not appear to be any signs of active infection in regard to her toe at this point although obviously it doesn't look as good as it did previous. I think this is directly related to her not taking off the pressure and friction by way of taking things easy. Though I completely understand what's going on. 01/29/19 on evaluation today patient's tools are actually appears to be showing some signs of improvement today compared to last week's evaluation as far as not necessarily the overall size of the wound but the fact that she has some new skin growth in between the two ends of the wound opening. Overall I feel like she has done well she states that she had a family member give her what sounds to be a CAM walker boot which has been helpful as well. 02/05/19 on evaluation today patient's wound bed actually appears to be doing significantly better in regard to her overall appearance of the size of the wound. With that being said she is still having an issue with offloading efficiently enough to get this to close. Apparently there is some signs of infection at this point as well unfortunately. Previously she's done well of Augmentin I really do not see anything that needs to be culture currently but there theme and cellulitis of the foot that I'm seeing I'm gonna go ahead and place her on an antibiotic today to try to help clear this up. 02/12/2019 on evaluation today patient actually appears to be doing poorly in regard to her overall wound status. She tells me she has been using her offloading shoe but actually comes in today wearing her tennis shoe with the AFO brace. Again as I previously discussed with her this is  really not sufficient to allow the area to heal appropriately. Nonetheless she continues to be somewhat noncompliant and I do wonder based on what she has told my nurse in the past as to whether or not a good portion of this noncompliance may be recreational drug and alcohol related. She has had a history of heroin overdose and this was fairly recently in the past couple of months that have been seeing her. Nonetheless overall I feel like her wound looks significantly worse today compared to what it was previous. She still has significant erythema despite the Augmentin I am not sure that this is an appropriate medication for her infection I am also concerned that the infection may have gone down into her bone. 02/19/19 on evaluation today patient actually appears to be doing about the same in regard to her toe ulcer. Unfortunately she continues to show signs  of bone exposure and infection at this point. There does not appear to be any evidence of worsening of the infection but I'm also not really sure that it's getting significantly better. She is on the Augmentin which should be sufficient for the Staphylococcus aureus infection that she has at this point. With that being said she may need IV antibiotics to more appropriately treat this. We did have a discussion today about hyperbaric option therapy. 02/28/19 on evaluation today patient actually appears to be doing much worse in regard to the wound on her left great toe as compared to even my previous evaluation last week. Unfortunately this seems to be training in a pretty poor direction. Her toe was actually now starting to angle laterally and I can actually see the entire joint area of the proximal portion of the digit where is the distal portion of the digit again is no longer even in contact with the joint line. Unfortunately there's a lot more necrotic tissue around the edge and the toe appears to be showing signs of becoming gangrenous in my  pinion. I'm very concerned about were things stand at this point. She did see infectious disease and they are planning to send in a prescription for Sivextro for her and apparently this has been approved. With that being said I don't think she should avoid taking this but at the same time I'm not sure that it's gonna be sufficient to save her toe at this point. 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 03/05/19 on evaluation today patient actually appears to be doing quite well in regard to her toe all things considering. She still has bone exposed but there appears to be much less your thing on overall the appearance of the wound and the toe itself is dramatically improved. She still does have some issues currently obviously with infection she did see vascular as well and there concerned that her blood flow to the toad. For that reason they are setting up for an angiogram next week. 03/14/19 on evaluation today patient appears to be doing very poor in regard to her toe and specifically in regard to the ulceration and the fact that she's starting to notice the toe was leaning even more towards the lateral aspect and the complete joint is visible on the proximal aspect of the joint. Nonetheless she's also noted a significant odor and the tip of the toe is turning more dark and necrotic appearing. Overall I think she is getting worse not better as far as this is concerned. For that reason I am recommending at this point that she likely needs to be seen for likely amputation. READMISSION 03/19/2021 This is a patient that we cared for in this clinic for a prolonged period of time in 2019 and 2020 with a left foot and left first toe wound. I believe she ultimately became infected and underwent a left  first toe amputation. 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 ([id number]) 04/09/20 by Dr. Luana Shu. In December 2021 she had an ulcer on her right great toe as well as the fourth and fifth toes. 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 She also had an angiogram at that time  and underwent angioplasty of the right anterior tibial artery. In any case she claims that the wound on the right foot is closed I did not look at this today which was probably an oversight although I think that should be done next week. After her surgery she developed a dehiscence but I do not see any follow-up of this. According to Dr. Deborra Medina last review that she was out of the area being cared for by another physician but recently came back to his attention. 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 A culture of this area showed E. coli apparently before she came back to see Dr. Luana Shu she was supposed to be receiving antibiotics but she did not really take them. Nor is she offloading this area at all. Finally her last hemoglobin A1c listed in epic was in March 2022 at 14.1 she says things are a lot better since then although I am not sure. 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 She had this wound at the time. During this admission she had E. coli sepsis a TEE was negative. 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 She is still a heavy cigarette smoker. She has a history of bipolar disorder seizure disorder ABI in our clinic was 1.05 6/1; left midfoot in the setting of a TMA done previously. Round  circular wound with a "knuckle" of protruding tissue. The problem is that the knuckle was not attached to any of the surrounding granulation and this probed proximally widely I removed a large portion of this tissue. This wound goes with considerable undermining laterally. I do not feel any bone there was no purulence but this is a deep wound. 6/8; in spite of the debridement I did last week. She arrives with a wound looking exactly the same. A protruding "knuckle" of tissue nonadherent to most of the surrounding tissue. There is considerable depth around this from 6-12 o'clock at 2.7 cm and undermining of 1 cm. This does not look overtly infected and the x-ray I did last week was negative for any osseous abnormalities. We have been using silver collagen 6/15; deep tissue culture I did last week showed moderate staph aureus and moderate Pseudomonas. This will definitely require prolonged antibiotic therapy. The pathology on the protuberant area was negative for malignancy fungus etc. 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 We have been using silver collagen. I am going to be prescribing Levaquin for 2 weeks. Her CT scan of the foot is down for 7/5 6/22; CT scan of the foot on 7 5. She says she has hardware in the left leg from her previous fracture. She is on the Levaquin for the deep tissue culture I did that showed methicillin sensitive staph aureus and Pseudomonas. I gave her a 2-week supply and she will have another week. She arrives in clinic today with the same protuberant tissue however this is nonadherent to the tissue surrounding it. I am really at a loss to explain this unless there is underlying deep tissue infection 6/29; patient presents for 1 week follow-up. She has been using collagen to the wound bed. She reports taking her antibiotics as prescribed.She has no complaints or  issues today. She denies signs of infection. 7/6;  patient presents for one week followup. She has been using collagen to the wound bed. She states she is taking Levaquin however at times she is not able to keep it down. She denies signs of infection. 7/13; patient presents for 1 week follow-up. She has been using silver alginate to the wound bed. She still has nausea when taking her antibiotics. She denies signs of infection. 7/20; patient presents for 1 week follow-up. She has been using silver alginate with gentamicin cream to the wound bed. She denies any issues and has no complaints today. She denies signs of infection. 7/27; patient presents for 1 week follow-up. She continues to use silver alginate with gentamicin cream to the wound bed. She reports starting her antibiotics. She has no issues or complaints. Overall she reports stability to the wound. 8/3; patient presents for 1 week follow-up. She has been using silver alginate with gentamicin cream to the wound bed. She reports completing all antibiotics. She has no issues or complaints today. She denies signs of infection. 8/17; patient presents for 2-week follow-up. He is to use silver alginate to the wound bed. She has no issues or complaints today. She denies signs of infection. She reports her pain has improved in her foot since last clinic visit 8/24; patient presents for 1 week follow-up. She continues to use silver alginate to the wound bed. She has no issues or complaints. She denies signs of infection. Pain is stable. 9/7; patient presents for follow-up. She missed her last week appointment due to feeling ill. She continues to use silver alginate. She has a new wound to the right lower extremity that is covered in eschar. She states It occurred over the past week and has no idea how it started. She currently denies signs of infection. 9/14; patient presents for follow-up. To the left foot wound she has been using gentamicin cream and silver alginate. To the right lower  extremity wound she has been keeping this covered and has not obtain Santyl. 9/21; patient presents for follow-up. She reports using gentamicin cream and silver alginate to the left foot and Santyl to the right lower extremity wound. She has no issues or complaints today. She denies signs of infection. 9/28; patient presents for follow-up. She reports a new wound to her right heel. She states this occurred a few days ago and is progressively gotten worse. She has been trying to clean the area with a Q-tip and Santyl. She reports stability in the other 2 wounds. She has been using gentamicin cream and silver alginate to the left foot and Santyl to the right lower extremity wound. 10/12; patient presents for follow-up. She reports improvement to the wound beds. She is seeing vein and vascular to discuss the potential of a left BKA. She states they are going to do an arteriogram. She continues to use silver alginate with dressing changes to her wounds. 11/2; patient presents for follow-up. She states she has not been doing dressing changes to the wound beds. She states she is not able to offload the areas. She reports chronic pain to her left foot wound. 11/9; patient presents for follow-up. She came in with only socks on. She states she forgot to put on shoes. It is unclear if she is doing any dressing changes. She currently denies systemic signs of infection. 11/16; patient presents for follow-up. She came again only with socks on. She states she does not wear shoes ever. It is  unclear if she does dressing changes. She currently denies systemic signs of infection. 11/23; patient presents for follow-up. She wore her shoes today. It still unclear exactly what dressing she is using for each wound but she did states she obtained Dakin's solution and has been using this to the left foot wound. She currently denies signs of infection. Electronic Signature(s) Signed: 09/15/2021 9:49:29 AM By: Kalman Shan DO Entered By: Kalman Shan on 09/15/2021 09:44:24 [name] ([name], [name] ([id number]) -------------------------------------------------------------------------------- Physical Exam Details Patient Name: Heather Dear A. Date of Service: 09/15/2021 8:45 AM Medical Record Number: [id number] Patient Account Number: [id number] Date of Birth/Sex: [birth year] (45 y.o. F) Treating RN: Cornell Barman Primary Care Provider: Raelene Bott Other Clinician: Referring Provider: Raelene Bott Treating Provider/Extender: Yaakov Guthrie in Treatment: 25 Constitutional . Cardiovascular . Psychiatric . Notes Left foot (Transmetatarsal amputation): Wound to the plantar aspect that has granulation tissue (Although tissue does not appear healthy) and nonviable tissue present. No more Green blue drainage to the periwound. Right lower extremity: To the anterior aspect there is an open wound with granulation tissue. Right foot: To the heel there is an open wound with Granulation tissue and non viable tissue present. Circumferential callus. No obvious signs of infection to any wound. Electronic Signature(s) Signed: 09/15/2021 9:49:29 AM By: Kalman Shan DO Entered By: Kalman Shan on 09/15/2021 09:46:10 [name] ([id number]) -------------------------------------------------------------------------------- Physician Orders Details Patient Name: Heather Dear A. Date of Service: 09/15/2021 8:45 AM Medical Record Number: [id number] Patient Account Number: [id number] Date of Birth/Sex: [birth year] (45 y.o. F) Treating RN: Cornell Barman Primary Care Provider: Raelene Bott Other Clinician: Referring Provider: Raelene Bott Treating Provider/Extender: Yaakov Guthrie in Treatment: 16 Verbal / Phone Orders: No Diagnosis Coding Follow-up Appointments o Return Appointment in 1 week. Bathing/ Shower/ Hygiene o Wash wounds with antibacterial  soap and water. Off-Loading Left Lower Extremity o Open toe surgical shoe with peg assist. o Turn and reposition every 2 hours o Other: - peg shoe right, off-loader left Medications-Please add to medication list. o P.O. Antibiotics Wound Treatment Wound #7 - Foot Wound Laterality: Plantar, Left, Midline Cleanser: Dakin 16 (oz) 0.25 1 x Per Day/30 Days Discharge Instructions: Use as directed. Primary Dressing: Gauze 1 x Per Day/30 Days Discharge Instructions: As directed: moistened with Dakins Solution Secured With: Kerlix Roll Sterile or Non-Sterile 6-ply 4.5x4 (yd/yd) 1 x Per Day/30 Days Discharge Instructions: Apply Kerlix as directed Wound #8 - Lower Leg Wound Laterality: Right, Anterior Cleanser: Soap and Water 1 x Per Day/30 Days Discharge Instructions: Gently cleanse wound with antibacterial soap, rinse and pat dry prior to dressing wounds Topical: Bacitracin Ointment, 1 (oz) tube 1 x Per Day/30 Days Secured With: Kerlix Roll Sterile or Non-Sterile 6-ply 4.5x4 (yd/yd) 1 x Per Day/30 Days Discharge Instructions: Apply Kerlix as directed Wound #9 - Calcaneus Wound Laterality: Right Cleanser: Soap and Water 1 x Per Day/30 Days Discharge Instructions: Gently cleanse wound with antibacterial soap, rinse and pat dry prior to dressing wounds Topical: Santyl Collagenase Ointment, 30 (gm), tube 1 x Per Day/30 Days Discharge Instructions: apply nickel thick to wound bed only Primary Dressing: Hydrofera Blue Ready Transfer Foam, 2.5x2.5 (in/in) 1 x Per Day/30 Days Discharge Instructions: Apply Hydrofera Blue Ready to wound bed as directed Secured With: coverlet 1 x Per Day/30 Days Electronic Signature(s) Signed: 09/15/2021 9:49:29 AM By: Kalman Shan DO Signed: 09/15/2021 3:24:52 PM By: Gretta Cool, BSN, RN, CWS, [name] RN, BSN Entered By: Gretta Cool, BSN, RN, CWS, [name] on  09/15/2021 09:38:42 [name], [name] ([name], [name] Kitchen  ([id number]) -------------------------------------------------------------------------------- Problem List Details Patient Name: ZILLMER, Vondell A. Date of Service: 09/15/2021 8:45 AM Medical Record Number: [id number] Patient Account Number: [id number] Date of Birth/Sex: [birth year] (45 y.o. 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 03/19/2021 No Yes severity L97.512 Non-pressure chronic ulcer of other part of right foot with fat layer 09/15/2021 No Yes exposed L97.812 Non-pressure chronic ulcer of other part of right lower leg with fat layer 09/15/2021 No Yes exposed E11.621 Type 2 diabetes mellitus with foot ulcer 03/19/2021 No Yes E11.42 Type 2 diabetes mellitus with diabetic polyneuropathy 03/19/2021 No Yes Inactive Problems ICD-10 Code Description Active Date Inactive Date S81.801A Unspecified open wound, right lower leg, initial encounter 06/30/2021 06/30/2021 Resolved Problems Electronic Signature(s) Signed: 09/15/2021 9:49:29 AM By: Kalman Shan DO Entered By: Kalman Shan on 09/15/2021 09:42:39 Cothran, Kahla A. ([id number]) -------------------------------------------------------------------------------- Progress Note Details Patient Name: Heather Dear A. Date of Service: 09/15/2021 8:45 AM Medical Record Number: [id number] Patient Account Number: [id number] Date of Birth/Sex: Jan 03, 1976 (45 y.o. F) Treating RN: Cornell Barman Primary Care Provider: Raelene Bott Other Clinician: Referring Provider: Raelene Bott Treating Provider/Extender: Yaakov Guthrie in Treatment: 25 Subjective Chief Complaint Information obtained from Patient 03/19/2021; patient referred by  Dr. Luana Shu who has been looking after her left foot for quite a period of time for review of a nonhealing area in the left midfoot History of Present Illness (HPI) 01/18/18-She is here for initial evaluation of the left great toe ulcer. She is a poor historian in regards to timeframe in detail. She states approximately 4 weeks ago she lacerated her toe on something in the house. She followed up with her primary care who placed her on Bactrim and ultimately a second dose of Bactrim prior to coming to wound clinic. She states she has been treating the toe with peroxide, Betadine and a Band-Aid. She did not check her blood sugar this morning but checked it yesterday morning it was 327; she is unaware of a recent A1c and there are no current records. She saw Dr. she would've orthopedics last week for an old injury to the left ankle, she states he did not see her toe, nor did she bring it to his attention. She smokes approximately 1 pack cigarettes a day. Her social situation is concerning, she arrives this morning with her mother who appears extremely intoxicated/under the influence; her mother was asked to leave the room and be monitored by the patient's grandmother. The patient's aunt then accompanied the patient and the room throughout the rest of the appointment. We had a lengthy discussion regarding the deleterious effects of uncontrolled hyperglycemia and smoking as it relates to wound healing and overall health. She was strongly encouraged to decrease her smoking and get her diabetes under better control. She states she is currently on a diet and has cut down her [facility] consumption. The left toe is erythematous, macerated and slightly edematous with malodor present. The edema in her left foot is below her baseline, there is no erythema streaking. We will treat her with Santyl, doxycycline; we have ordered and xray, culture and provided a Peg assist surgical shoe and cultured the  wound. 01/25/18-She is here in follow-up evaluation for a left great toe  ulcer and presents with an abscess to her suprapubic area. She states her blood sugars remain elevated, feeling "sick" and if levels are below 250, but she is trying. She has made no attempt to decrease her smoking stating that we "can't take away her food in her cigarettes". She has been compliant with offloading using the PEG assist you. She is using Santyl daily. the culture obtained last week grew staph aureus and Enterococcus faecalis; continues on the doxycycline and Augmentin was added on Monday. The suprapubic area has erythema, no femoral variation, purple discoloration, minimal induration, was accessed with a cotton tip applicator with sanguinopurulent drainage, this was cultured, I suspect the current antibiotic treatment will cover and we will not add anything to her current treatment plan. She was advised to go to urgent care or ER with any change in redness, induration or fever. 02/01/18-She is here in follow-up evaluation for left great toe ulcers and a new abdominal abscess from last week. She was able to use packing until earlier this week, where she "forgot it was there". She states she was feeling ill with GI symptoms last week and was not taking her antibiotic. She states her glucose levels have been predominantly less than 200, with occasional levels between 200-250. She thinks this was contributing to her GI symptoms as they have resolved without intervention. There continues to be significant laceration to left toe, otherwise it clinically looks stable/improved. There is now less superficial opening to the lateral aspect of the great toe that was residual blister. We will transition to [facility] to all wounds, she will continue her Augmentin. If there is no change or deterioration next week for reculture. 02/08/18-She is here in follow-up evaluation for left great toe ulcer and abdominal ulcer. There is an  improvement in both wounds. She has been wrapping her left toe with coban, not by our direction, which has created an area of discoloration to the medial aspect; she has been advised to NOT use coban secondary to her neuropathy. She states her glucose levels have been high over this last week ranging from 200-350, she continues to smoke. She admits to being less compliant with her offloading shoe. We will continue with same treatment plan and she will follow-up next week. 02/15/18-She is here in follow-up evaluation for left great toe ulcer and abdominal ulcer. The abdominal ulcer is epithelialized. The left great toe ulcer is improved and all injury from last week using the Coban wrap is resolved, the lateral ulcer is healed. She admits to noncompliance with wearing offloading shoe and admits to glucose levels being greater than 300 most of the week. She continues to smoke and expresses no desire to quit. There is one area medially that probes deeper than it has historically, erythema to the toe and dorsal foot has consistently waxed and waned. There is no overt signs of cellulitis or infection but we will culture the wound for any occult infection given the new area of depth and erythema. We will hold off on sensitivities for initiation of antibiotic therapy. 02/22/18-She is here in follow up evaluation for left great toe ulcer. There is overall significant improvement in both wound appearance, erythema and edema with changes made last week. She was not initiated on antibiotic therapy. Culture obtained last week showed oxacillin sensitive staph aureus, sensitive to clindamycin. Clindamycin has been called into the pharmacy but she has been instructed to hold off on initiation secondary to overall clinical improvement and her history of antibiotic intolerance.  She has been instructed to contact the clinic with any noted changes/deterioration and the wound, erythema, edema and/or pain. She will follow-up  next week. She continues to smoke and her glucose levels remain elevated >250; she admits to compliance with offloading shoe 03/01/18 on evaluation today patient appears to be doing fairly well in regard to her left first toe ulcer. She has been tolerating the dressing changes with the [facility] Dressing without complication and overall this has definitely showed signs of improvement according to records as well is what the patient tells me today. I'm very pleased in that regard. She is having no pain today 03/08/18 She is here for follow up evaluation of a left great toe ulcer. She remains non-compliant with glucose control and smoking cessation; glucose levels consistently >200. She states that she got new shoe inserts/peg assist. She admits to compliance with offloading. Since my last evaluation there is significant improvement. We will switch to prisma at this time and she will follow up next week. She is noted to be tachycardic at this appointment, heart rate 120s; she has a history of heart rate 70-130 according to our records. She admits to extreme agitation r/t personal issues; she was advised to monitor her heartrate and contact her physician if it does not return to a more normal range (<100). She takes cardizem twice daily. 03/15/18-She is here in follow-up evaluation for left great toe ulcer. She remains noncompliant with glucose control and smoking cessation. She admits to compliance with wearing offloading shoe. The ulcer is improved/stable and we will continue with the same treatment plan and she will follow-up next week 03/22/18-She is here for evaluation for left great toe ulcer. There continues to be significant improvement despite recurrent hyperglycemia (over 500 yesterday) and she continues to smoke. She has been compliant with offloading and we will continue with same treatment plan and she will follow-up next week. Vane, Nalaysia A. ([id number]) 03/29/18-She is here for  evaluation for left great toe ulcer. Despite continuing to smoke and uncontrolled diabetes she continues to improve. She is compliant with offloading shoe. We will continue with the same treatment plan and she will follow-up next week 04/05/18- She is here in follow up evaluation for a left great toe ulcer; she presents with small pustule to left fifth toe (resembles ant bite). She admits to compliance with wearing offloading shoe; continues to smoke or have uncontrolled blood glucose control. There is more callus than usual with evidence of bleeding; she denies known trauma. 04/12/18-She is here for evaluation of left great toe ulcer. Despite noncompliance with glycemic control and smoking she continues to make improvement. She continues to wear offloading shoe. The pustule, that was identified last week, to the left fifth toe is resolved. She will follow-up in 2 weeks 05/03/18-she is seen in follow-up evaluation for a left great toe ulcer. She is compliant with offloading, otherwise noncompliant with glycemic control and smoking. She has plateaued and there is minimal improvement noted. We will transition to [facility], replaced the insert to her surgical shoe and she will follow-up in one week 05/10/18- She is here in follow up evaluation for a left great toe ulcer. It appears stable despite measurement change. We will continue with same treatment plan and follow up next week. 05/24/18-She is seen in follow-up evaluation for a left great toe ulcer. She remains compliant with offloading, has made significant improvement in her diet, decreasing the amount of sugar/soda. She said her recent A1c was 10.9 which  is lower than. She did see a diabetic nutritionist/educator yesterday. She continues to smoke. We will continue with the same treatment plan and she'll follow-up next week. 05/31/18- She is seen in follow-up evaluation for left great toe ulcer. She continues to remain compliant with offloading,  continues to make improvement in her diet, increasing her water and decreasing the amount of sugar/soda. She does continue to smoke with no desire to quit. We will apply Prisma to the depth and Hydrofera Blue over. We have not received insurance authorization for oasis. She will follow up next week. 06/07/18-She is seen in follow-up evaluation for left great toe ulcer. It has stalled according to today's measurements although base appears stable. She says she saw a diabetic educator yesterday; her average blood sugars are less than 300 which is an improvement for her. She continues to smoke and states "that's my next step" She continues with water over soda. We will order for xray, culture and reinstate ace wrap compression prior to placing apligraf for next week. She is voicing no complaints or concerns. Her dressing will change to iodoflex over the next week in preparation for apligraf. 06/14/18-She is seen in follow-up evaluation for left great toe ulcer. Plain film x-ray performed last week was negative for osteomyelitis. Wound culture obtained last week grew strep B and OSSA; she is initiated on keflex and cefdinir today; there is erythema to the toe which could be from ace wrap compression, she has a history of wrapping too tight and has has been encouraged to maintain ace wraps that we place today. We will hold off on application of apligraf today, will apply next week after antibiotic therapy has been initiated. She admits today that she has resumed taking a shower with her foot/toe submerged in water, she has been reminded to keep foot/toe out of the bath water. She will be seen in follow up next week 06/21/18-she is seen in follow-up evaluation for left great toe ulcer. She is tolerating antibiotic therapy with no GI disturbance. The wound is stable. Apligraf was applied today. She has been decreasing her smoking, only had 4 cigarettes yesterday and 1 today. She continues being more compliant  in diabetic diet. She will follow-up next week for evaluation of site, if stable will remove at 2 weeks. 06/28/18- She is here in follow up evalution. Apligraf was placed last week, she states the dressing fell off on Tuesday and she was dressing with hydrofera blue. She is healed and will be discharged from the clinic today. She has been instructed to continue with smoking cessation, continue monitoring glucose levels, offloading for an additional 4 weeks and continue with hydrofera blue for additional two weeks for any possible microscopic opening. Readmission: 08/07/18 on evaluation today patient presents for reevaluation concerning the ulcer of her right great toe. She was previously discharged on 06/28/18 healed. Nonetheless she states that this began to show signs of drainage she subsequently went to her primary care provider. Subsequently an x-ray was performed on 08/01/18 which was negative. The patient was also placed on antibiotics at that time. Fortunately they should have been effective for the infection. Nonetheless she's been experiencing some improvement but still has a lot of drainage coming from the wound itself. 08/14/18 on evaluation today patient's wound actually does show signs of improvement in regard to the erythema at this point. She has completed the antibiotics. With that being said we did discuss the possibility of placing her in a total contact cast as of today  although I think that I may want to give this just a little bit more time to ensure nothing recurrence as far as her infection is concerned. I do not want to put in the cast and risk infection at that time if things are not completely resolved. With that being said she is gonna require some debridement today. 08/21/18 on evaluation today patient actually appears to be doing okay in regard to her toe ulcer. She's been tolerating the dressing changes without complication. With that being said it does appear that she is  ready and in fact I think it's appropriate for [place] to go ahead and initiate the total contact cast today. Nonetheless she will require some sharp debridement to prepare the wound for application. Overall I feel like things have been progressing well but we do need to do something to get this to close more readily. 08/24/18 patient seen today for reevaluation after having had the total contact cast applied on Tuesday. She seems to have done very well the wound appears to be doing great and overall I'm pleased with the progress that she's made. There were no abnormal areas of rubbing from the cast on her lower extremity. 08/30/18 on evaluation today patient actually appears to be completely healed in regard to her plantar toe ulcer. She tells me at this point she's been having a lot of issues with the cast. She almost fell a couple of times the state shall the step of her dog a couple times as well. This is been a very frustrating process for her other nonetheless she has completely healed the wound which is excellent news. Overall there does not appear to be the evidence of infection at this time which is great news. 09/11/18 evaluation today patient presents for follow-up concerning her great toe ulcer on the left which has unfortunately reopened since I last saw her which was only a couple of weeks ago. Unfortunately she was not able to get in to get the shoe and potentially the AFO that's gonna be necessary due to her left foot drop. She continues with offloading shoe but this is not enough to prevent her from reopening it appears. When we last had her in the total contact cast she did well from a healing standpoint but unfortunately the wound reopened as soon as she came out of the cast within just a couple of weeks. 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 This is also part of the reason why she falls on a regular basis. I just do not believe that is gonna be safe for [place] to reinitiate the total contact cast as last time we had this on she fell 3 times one week which is definitely not normal for her. 09/18/18 upon evaluation today the patient actually appears to be doing about the same in regard to her toe ulcer. She did not contact [facility] as I asked her to even though I had given her the prescription. In fact she actually states that she has no idea where the prescription is. She did apparently call [employer] and they told her that all she needed to do was bring the prescription in order to be able to be seen and work on getting the AFO for her left foot. With all  that being said she still does not have an appointment and I'm not sure were things stand that regard. I will give her a new prescription today in order to contact them to get this set up. [name], [name] ([id number]) 09/25/18 on evaluation today patient actually appears to be doing about the same in regard to her toes ulcer. She does have a small areas which seems to have a lot of callous buildup around the edge of the wound which is going to need sharp debridement today. She still is waiting to be scheduled for evaluation with [facility] for possibility of an AFO. She states there supposed to call her tomorrow to get this set up. Unfortunately it does appear that her foot specifically the toe area is showing signs of erythema. There does not appear to be any systemic infection which is in these good news. 10/02/18 on evaluation today patient actually appears to be doing about the same in regard to her toe ulcer. This really has not done too well although it's not significantly larger it's also not significantly smaller. She has been tolerating the dressing changes without complication. She actually has her appointment with [facility] and  [facility] tomorrow to hopefully be measured for obtaining and AFO splint. I think this would be helpful preventing this from reoccurring. We had contemplated starting the cast this week although to be honest I am reluctant to do that as she's been having nausea, vomiting, and seizure activity over the past three days. She has a history of seizures and have been told is nothing that can be done for these. With that being said I do believe that along with the seizures have the nausea vomiting which upon further questioning doesn't seem to be the normal for her and makes me concerned for the possibility of infection or something else going on. I discussed this with the patient and her mother during the office visit today. I do not feel the wound is effective but maybe something else. The responses this was "this just happens to her at times and we don't know why". They did not seem to be interested in going to the hospital to have this checked out further. 10/09/18 on evaluation today patient presents for follow-up concerning her ongoing toe ulcer. She has been tolerating the dressing changes without complication. Fortunately there does not appear to be any evidence of infection which is great news however I do think that the patient would benefit from going ahead for with the total contact cast. She's actually in a wheelchair today she tells me that she will use her walker if we initiate the cast. I was very specific about the fact that if we were gonna do the cast I wanted to make sure that she was using the walker in order to prevent any falls. She tells me she does not have stairs that she has to traverse on a regular basis at her home. She has not had any seizures since last week again that something that happens to her often she tells me she did talk to [employer] and they said that it may take up to three weeks to get the brace approved for her. Hopefully that will not take that long but nonetheless in  the meantime I do think the cast could be of benefit. 10/12/18 on evaluation today patient appears to be doing rather well in regard to her toe ulcer. It's just been a few days and already this is significantly improved both as far  as overall appearance and size. Fortunately there's no sign of infection. She is here for her first obligatory cast change. 10/19/18 Seen today for follow up and management of left great toe ulcer. Wound continues to show improvement. Noted small open area with seroussang drainage with palpation. Denies any increased pain or recent fevers during visit. She will continue calcium alginate with offloading shoe. Denies any questions or concerns during visit. 10/26/18 on evaluation today patient appears to be doing about the same as when I last saw her in regard to her wound bed. Fortunately there does not appear to be any signs of infection. Unfortunately she continues to have a breakdown in regard to the toe region any time that she is not in the cast. It takes almost no time at all for this to happen. Nonetheless she still has not heard anything from the brace being made by [employer] as to when exactly this will be available to her. Fortunately there is no signs of infection at this time. 10/30/18 on evaluation today patient presents for application of the total contact cast as we just received him this morning. Fortunately we are gonna be able to apply this to her today which is great news. She continues to have no significant pain which is good news. Overall I do feel like things have been improving while she was the cast is when she doesn't have a cast that things get worse. She still has not really heard anything from [place] regarding her brace. 11/02/18 upon evaluation today patient's wound already appears to be doing significantly better which is good news. Fortunately there does not appear to be any signs of infection also good news. Overall I do think the total contact  cast as before is helping to heal this area unfortunately it's just not gonna likely keep the area closed and healed without her getting her brace at least. Again the foot drop is a significant issue for her. 11/09/18 on evaluation today patient appears to be doing excellent in regard to her toe ulcer which in fact is completely healed. Fortunately we finally got the situation squared away with the paperwork which was needed to proceed with getting her brace approved by Medicaid. I have filled that out unfortunately that information has been sent to the orthopedic office that I worked at 2 1/2 years ago and not tired Current wound care measures. Fortunately she seems to be doing very well at this time. 11/23/18 on evaluation today patient appears to be doing More Poorly Compared to Last Time I Saw Her. At [facility] She Had Completely Healed. Currently she is continuing to have issues with reopening. She states that she just found out that the brace was approved through Medicaid now she just has to go get measured in order to have this fitted for her and then made. Subsequently she does not have an appointment for this yet that is going to complicate things we obviously cannot put her back in the cast if we do not have everything measured because they're not gonna be able to measure her foot while she is in the cast. 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 Obviously this is unfortunate and does have me somewhat worried as well. 11/30/18 on evaluation today patient's toe ulcer actually appears to be doing fairly well. The good news is she will be getting her brace in the  shoes next week on Wednesday. Hopefully we will be able to get this to heal without having to go back in the cast however she may need the cast in order to get the wound completely heal and then go from there. Fortunately there's no signs  of infection at this time. 12/07/18 on evaluation today patient fortunately did receive her brace and she states she could tell this definitely makes her walk better. With that being said she's been having issues with her toe where she noticed yesterday there was a lot of tissue that was loosing off this appears to be much larger than what it was previous. She also states that her leg has been read putting much across the top of her foot just about the ankle although this seems to be receiving somewhat. The total area is still red and appears to be someone infected as best I can tell. She is previously taken Bactrim and that may be a good option for her today as well. We are gonna see what I wound culture shows as well and I think that this is definitely appropriate. With that being said outside of the culture I still need to initiate something in the interim and that's what I'm gonna go ahead and select Bactrim is a good option for her. 12/14/18 on evaluation today patient appears to be doing better in regard to her left great toe ulcer as compared to last week's evaluation. There's still some erythema although this is significantly improved which is excellent news. Overall I do believe that she is making good progress is still gonna take some time before she is where I would like her to be from the standpoint of being able to place her back into the total contact cast. Hopefully we will be where we need to be by next week. 12/21/18 on evaluation today patient actually appears to be doing poorly in regard to her toe ulcer. She's been tolerating the dressing changes without complication. Fortunately there's no signs of systemic infection although she does have a lot of drainage from the toe ulcer and this does Hessler, Lovell A. ([id number]) seem to be causing some issues at this point. She does have erythema on the distal portion of her toe that appears to be likely cellulitis. 12/28/18 on evaluation today  patient actually appears to be doing a little better in my pinion in regard to her toe ulcer. With that being said she still does have some evidence of infection at this time and for her culture she had both E. coli as well as enterococcus as organisms noted on evaluation. For that reason I think that though the Keflex likely has treated the E. coli rather well this has really done nothing for the enterococcus. We are going to have to initiate treatment for this specifically. 01/04/19 on evaluation today patient's toe actually appears to be doing better from the standpoint of infection. She currently would like to see about putting the cash back on I think that this is appropriate as long as she takes care of it and keeps it from getting wet. She is gonna have some drainage we can definitely pass this up with Drawtex and alginate to try to prevent as much drainage as possible from causing the problems. With that being said I do want to at least try her with the cast between now and Tuesday. If there any issues we can't continue to use it then I will discontinue the use of the cast at  that point. 01/08/19 on evaluation today patient actually appears to be doing very well as far as her foot ulcer specifically the great toe on the left is concerned. She did have an area of rubbing on the medial aspect of her left ankle which again is from the cast. Fortunately there's no signs of infection at this point in this appears to be a very slight skin breakdown. The patient tells me she felt it rubbing but didn't think it was that bad. Fortunately there is no signs of active infection at this time which is good news. No fevers, chills, nausea, or vomiting noted at this time. 01/15/19 on evaluation today patient actually appears to be doing well in regard to her toe ulcer. Again as previous she seems to do well and she has the cast on which indicates to me that during the time she doesn't have a cast on she's putting  way too much pressure on this region. Obviously I think that's gonna be an issue as with the current national emergency concerning the Covid-19 Virus it has been recommended that we discontinue the use of total contact casting by the chief medical officer of our company, Dr. Simona Huh. The reasoning is that if a patient becomes sick and cannot come into have the cast removed they could not just leave this on for an additional two weeks. Obviously the hospitals also do not want to receive patient's who are sick into the emergency department to potentially contaminate the region and spread the Covid-19 Virus among other sick individuals within the hospital system. Therefore at this point we are suspending the use of total contact cast until the current emergency subsides. This was all discussed with the patient today as well. 01/22/19 on evaluation today patient's wound on her left great toe appears to be doing slightly worse than previously noted last week. She tells me that she has been on this quite a bit in fact she tells me she's been awake for 38 straight hours. This is due to the fact that she's having to care for grandparents because nobody else will. She has been taking care of them for five the last seven days since I've seen her they both have dementia his is from a stroke and her grandmother's was progressive. Nonetheless she states even her mom who knows her condition and situation has only help two of those days to take care of them she's been taking care of the rest. Fortunately there does not appear to be any signs of active infection in regard to her toe at this point although obviously it doesn't look as good as it did previous. I think this is directly related to her not taking off the pressure and friction by way of taking things easy. Though I completely understand what's going on. 01/29/19 on evaluation today patient's tools are actually appears to be showing some signs of improvement  today compared to last week's evaluation as far as not necessarily the overall size of the wound but the fact that she has some new skin growth in between the two ends of the wound opening. Overall I feel like she has done well she states that she had a family member give her what sounds to be a CAM walker boot which has been helpful as well. 02/05/19 on evaluation today patient's wound bed actually appears to be doing significantly better in regard to her overall appearance of the size of the wound. With that being said she is still having  an issue with offloading efficiently enough to get this to close. Apparently there is some signs of infection at this point as well unfortunately. Previously she's done well of Augmentin I really do not see anything that needs to be culture currently but there theme and cellulitis of the foot that I'm seeing I'm gonna go ahead and place her on an antibiotic today to try to help clear this up. 02/12/2019 on evaluation today patient actually appears to be doing poorly in regard to her overall wound status. She tells me she has been using her offloading shoe but actually comes in today wearing her tennis shoe with the AFO brace. Again as I previously discussed with her this is really not sufficient to allow the area to heal appropriately. Nonetheless she continues to be somewhat noncompliant and I do wonder based on what she has told my nurse in the past as to whether or not a good portion of this noncompliance may be recreational drug and alcohol related. She has had a history of heroin overdose and this was fairly recently in the past couple of months that have been seeing her. Nonetheless overall I feel like her wound looks significantly worse today compared to what it was previous. She still has significant erythema despite the Augmentin I am not sure that this is an appropriate medication for her infection I am also concerned that the infection may have gone down  into her bone. 02/19/19 on evaluation today patient actually appears to be doing about the same in regard to her toe ulcer. Unfortunately she continues to show signs of bone exposure and infection at this point. There does not appear to be any evidence of worsening of the infection but I'm also not really sure that it's getting significantly better. She is on the Augmentin which should be sufficient for the Staphylococcus aureus infection that she has at this point. With that being said she may need IV antibiotics to more appropriately treat this. We did have a discussion today about hyperbaric option therapy. 02/28/19 on evaluation today patient actually appears to be doing much worse in regard to the wound on her left great toe as compared to even my previous evaluation last week. Unfortunately this seems to be training in a pretty poor direction. Her toe was actually now starting to angle laterally and I can actually see the entire joint area of the proximal portion of the digit where is the distal portion of the digit again is no longer even in contact with the joint line. Unfortunately there's a lot more necrotic tissue around the edge and the toe appears to be showing signs of becoming gangrenous in my pinion. I'm very concerned about were things stand at this point. She did see infectious disease and they are planning to send in a prescription for Sivextro for her and apparently this has been approved. With that being said I don't think she should avoid taking this but at the same time I'm not sure that it's gonna be sufficient to save her toe at this point. 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 03/05/19 on evaluation today patient actually appears to be doing quite well in regard to her toe  all things considering. She still has bone exposed but there appears  to be much less your thing on overall the appearance of the wound and the toe itself is dramatically improved. She still does have some issues currently obviously with infection she did see vascular as well and there concerned that her blood flow to the toad. For that reason they are setting up for an angiogram next week. 03/14/19 on evaluation today patient appears to be doing very poor in regard to her toe and specifically in regard to the ulceration and the fact that she's starting to notice the toe was leaning even more towards the lateral aspect and the complete joint is visible on the proximal aspect of the joint. Nonetheless she's also noted a significant odor and the tip of the toe is turning more dark and necrotic appearing. Overall I think she is getting worse not better as far as this is concerned. For that reason I am recommending at this point that she likely needs to be seen for likely amputation. SHANAI, LARTIGUE ([id number]) READMISSION 03/19/2021 This is a patient that we cared for in this clinic for a prolonged period of time in 2019 and 2020 with a left foot and left first toe wound. 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 Luana Shu. In December 2021 she had an ulcer on her right great toe as well as the fourth and fifth toes. 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 She also had an angiogram at that time and underwent angioplasty of the right anterior tibial artery. In any case she claims that the wound on the right foot is closed I did not look at this today which was probably an oversight although I think that should be done next week. After her surgery she developed a dehiscence but I do not see any follow-up of this. According to Dr. Deborra Medina last review  that she was out of the area being cared for by another physician but recently came back to his attention. 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 A culture of this area showed E. coli apparently before she came back to see Dr. Luana Shu she was supposed to be receiving antibiotics but she did not really take them. Nor is she offloading this area at all. Finally her last hemoglobin A1c listed in epic was in March 2022 at 14.1 she says things are a lot better since then although I am not sure. 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 She had this wound at the time. During this admission she had E. coli sepsis a TEE was negative. 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 She is still a heavy cigarette smoker. She has a history of bipolar disorder seizure disorder ABI in our clinic was 1.05 6/1; left midfoot in the setting of a TMA done previously. Round circular wound with a "knuckle" of protruding tissue. The problem is that the knuckle was not attached to any of the surrounding granulation and this probed proximally widely I removed a large portion of this tissue. This wound goes with considerable undermining laterally. I do not feel any bone there  was no purulence but this is a deep wound. 6/8; in spite of the debridement I did last week. She arrives with a wound looking exactly the same. A protruding "knuckle" of tissue nonadherent to most of the surrounding tissue. There is considerable depth around this from 6-12 o'clock at 2.7 cm and undermining of 1 cm. This does not look overtly infected and the x-ray I did last week was negative for any  osseous abnormalities. We have been using silver collagen 6/15; deep tissue culture I did last week showed moderate staph aureus and moderate Pseudomonas. This will definitely require prolonged antibiotic therapy. The pathology on the protuberant area was negative for malignancy fungus etc. 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 We have been using silver collagen. I am going to be prescribing Levaquin for 2 weeks. Her CT scan of the foot is down for 7/5 6/22; CT scan of the foot on 7 5. She says she has hardware in the left leg from her previous fracture. She is on the Levaquin for the deep tissue culture I did that showed methicillin sensitive staph aureus and Pseudomonas. I gave her a 2-week supply and she will have another week. She arrives in clinic today with the same protuberant tissue however this is nonadherent to the tissue surrounding it. I am really at a loss to explain this unless there is underlying deep tissue infection 6/29; patient presents for 1 week follow-up. She has been using collagen to the wound bed. She reports taking her antibiotics as prescribed.She has no complaints or issues today. She denies signs of infection. 7/6; patient presents for one week followup. She has been using collagen to the wound bed. She states she is taking Levaquin however at times she is not able to keep it down. She denies signs of infection. 7/13; patient presents for 1 week follow-up. She has been using silver alginate to the wound bed. She still has nausea when taking her antibiotics. She denies signs of infection. 7/20; patient presents for 1 week follow-up. She has been using silver alginate with gentamicin cream to the wound bed. She denies any issues and has no complaints today. She denies signs of infection. 7/27; patient presents for 1 week follow-up. She continues to use silver alginate with gentamicin cream to the wound bed.  She reports starting her antibiotics. She has no issues or complaints. Overall she reports stability to the wound. 8/3; patient presents for 1 week follow-up. She has been using silver alginate with gentamicin cream to the wound bed. She reports completing all antibiotics. She has no issues or complaints today. She denies signs of infection. 8/17; patient presents for 2-week follow-up. He is to use silver alginate to the wound bed. She has no issues or complaints today. She denies signs of infection. She reports her pain has improved in her foot since last clinic visit 8/24; patient presents for 1 week follow-up. She continues to use silver alginate to the wound bed. She has no issues or complaints. She denies signs of infection. Pain is stable. 9/7; patient presents for follow-up. She missed her last week appointment due to feeling ill. She continues to use silver alginate. She has a new wound to the right lower extremity that is covered in eschar. She states It occurred over the past week and has no idea how it started. She currently denies signs of infection. 9/14; patient presents for follow-up. To the left foot wound she has been  using gentamicin cream and silver alginate. To the right lower extremity wound she has been keeping this covered and has not obtain Santyl. 9/21; patient presents for follow-up. She reports using gentamicin cream and silver alginate to the left foot and Santyl to the right lower extremity wound. She has no issues or complaints today. She denies signs of infection. 9/28; patient presents for follow-up. She reports a new wound to her right heel. She states this occurred a few days ago and is progressively gotten worse. She has been trying to clean the area with a Q-tip and Santyl. She reports stability in the other 2 wounds. She has been using gentamicin cream and silver alginate to the left foot and Santyl to the right lower extremity wound. 10/12; patient presents for  follow-up. She reports improvement to the wound beds. She is seeing vein and vascular to discuss the potential of a left BKA. She states they are going to do an arteriogram. She continues to use silver alginate with dressing changes to her wounds. 11/2; patient presents for follow-up. She states she has not been doing dressing changes to the wound beds. She states she is not able to offload the areas. She reports chronic pain to her left foot wound. 11/9; patient presents for follow-up. She came in with only socks on. She states she forgot to put on shoes. It is unclear if she is doing any dressing changes. She currently denies systemic signs of infection. 11/16; patient presents for follow-up. She came again only with socks on. She states she does not wear shoes ever. It is unclear if she does dressing changes. She currently denies systemic signs of infection. 11/23; patient presents for follow-up. She wore her shoes today. It still unclear exactly what dressing she is using for each wound but she did states she obtained Dakin's solution and has been using this to the left foot wound. She currently denies signs of infection. [name], [name] ([id number]) Patient History Information obtained from Patient. Allergies erythromycin base, Neosporin (neo-bac-polym) Social History Current every day smoker, Marital Status - Single, Alcohol Use - Never, Drug Use - Prior History, Caffeine Use - Daily. Medical History Ear/Nose/Mouth/Throat Patient has history of Chronic sinus problems/congestion, Middle ear problems Hematologic/Lymphatic Patient has history of Anemia Respiratory Patient has history of Chronic Obstructive Pulmonary Disease (COPD) Cardiovascular Patient has history of Congestive Heart Failure - EF 33% Endocrine Patient has history of Type II Diabetes Genitourinary Patient has history of End Stage Renal Disease Integumentary (Skin) Patient has history of History of pressure  wounds Neurologic Patient has history of Neuropathy - feel and legs Objective Constitutional Vitals Time Taken: 9:05 AM, Height: 69 in, Weight: 185 lbs, BMI: 27.3, Temperature: 98.5 F, Pulse: 118 bpm, Respiratory Rate: 18 breaths/min, Blood Pressure: 162/92 mmHg. General Notes: Left foot (Transmetatarsal amputation): Wound to the plantar aspect that has granulation tissue (Although tissue does not appear healthy) and nonviable tissue present. No more Green blue drainage to the periwound. Right lower extremity: To the anterior aspect there is an open wound with granulation tissue. Right foot: To the heel there is an open wound with Granulation tissue and non viable tissue present. Circumferential callus. No obvious signs of infection to any wound. Integumentary (Hair, Skin) Wound #7 status is Open. Original cause of wound was Gradually Appeared. The date acquired was: 08/07/2020. The wound has been in treatment 25 weeks. The wound is located on the [place]. The wound measures 5.5cm length x 3.5cm width x 0.6cm depth;  15.119cm^2 area and 9.071cm^3 volume. There is a medium amount of serosanguineous drainage noted. Wound #8 status is Open. Original cause of wound was Gradually Appeared. The date acquired was: 06/30/2021. The wound has been in treatment 11 weeks. The wound is located on the Right,Anterior Lower Leg. The wound measures 1.5cm length x 0.9cm width x 0.1cm depth; 1.06cm^2 area and 0.106cm^3 volume. There is a none present amount of drainage noted. Wound #9 status is Open. Original cause of wound was Gradually Appeared. The date acquired was: 07/18/2021. The wound has been in treatment 8 weeks. The wound is located on the Right Calcaneus. The wound measures 0.4cm length x 0.2cm width x 0.5cm depth; 0.063cm^2 area and 0.031cm^3 volume. There is a medium amount of serosanguineous drainage noted. 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 There were no obvious signs of infection on exam. I debrided nonviable tissue. I recommended continuing Dakin's solution to the left foot wound. I recommended antibiotic ointment to the right anterior leg and Hydrofera Blue to the right heel wound. Procedures Wound #7 Pre-procedure diagnosis of Wound #7 is a Diabetic Wound/Ulcer of the Lower Extremity located on the [place] .Severity of Tissue Pre Debridement is: Fat layer exposed. There was a Excisional Skin/Subcutaneous Tissue Debridement with a total area of 12 sq cm performed by Kalman Shan, MD. With the following instrument(s): Curette to remove Viable and Non-Viable tissue/material. Material removed includes Subcutaneous Tissue, Slough, and Biofilm. No specimens were taken. A time out was conducted at 09:31, prior to the start of the procedure. A Moderate amount of bleeding was controlled with Pressure. The procedure was tolerated well. Post Debridement Measurements: 5.5cm length x 3.5cm width x 0.6cm depth; 9.071cm^3 volume. Character of Wound/Ulcer Post Debridement is stable. Severity of Tissue Post Debridement is: Fat layer exposed. Post procedure Diagnosis Wound #7: Same as Pre-Procedure Wound #9 Pre-procedure diagnosis of Wound #9 is a Diabetic Wound/Ulcer of the Lower Extremity located on the Right Calcaneus .Severity of Tissue Pre Debridement is: Fat layer exposed. There was a Excisional Skin/Subcutaneous Tissue Debridement with a total area of 0.12 sq cm performed by Kalman Shan, MD. With the following  instrument(s): Curette to remove Viable and Non-Viable tissue/material. Material removed includes Callus and Subcutaneous Tissue and. No specimens were taken. A time out was conducted at 09:31, prior to the start of the procedure. A Moderate amount of bleeding was controlled with Pressure. The procedure was tolerated well. Post Debridement Measurements: 0.5cm length x 0.4cm width x 0.5cm depth; 0.079cm^3 volume. Character of Wound/Ulcer Post Debridement is stable. Severity of Tissue Post Debridement is: Fat layer exposed. Post procedure Diagnosis Wound #9: Same as Pre-Procedure Plan Follow-up Appointments: Return Appointment in 1 week. Bathing/ Shower/ Hygiene: Wash wounds with antibacterial soap and water. Off-Loading: Open toe surgical shoe with peg assist. Turn and reposition every 2 hours Other: - peg shoe right, off-loader left Medications-Please add to medication list.: P.O. Antibiotics WOUND #7: - Foot Wound Laterality: Plantar, Left, Midline Cleanser: Dakin 16 (oz) 0.25 1 x Per Day/30 Days Discharge Instructions: Use as directed. Primary Dressing: Gauze 1 x Per Day/30 Days  Discharge Instructions: As directed: moistened with Dakins Solution Secured With: Kerlix Roll Sterile or Non-Sterile 6-ply 4.5x4 (yd/yd) 1 x Per Day/30 Days Discharge Instructions: Apply Kerlix as directed WOUND #8: - Lower Leg Wound Laterality: Right, Anterior Cleanser: Soap and Water 1 x Per Day/30 Days Discharge Instructions: Gently cleanse wound with antibacterial soap, rinse and pat dry prior to dressing wounds Topical: Bacitracin Ointment, 1 (oz) tube 1 x Per Day/30 Days Secured With: [employer] Sterile or Non-Sterile 6-ply 4.5x4 (yd/yd) 1 x Per Day/30 Days Discharge Instructions: Apply Kerlix as directed WOUND #9: - Calcaneus Wound Laterality: Right Cleanser: Soap and Water 1 x Per Day/30 Days Discharge Instructions: Gently cleanse wound with antibacterial soap, rinse and pat dry prior to dressing  wounds Topical: Santyl Collagenase Ointment, 30 (gm), tube 1 x Per Day/30 Days Discharge Instructions: apply nickel thick to wound bed only Primary Dressing: Hydrofera Blue Ready Transfer Foam, 2.5x2.5 (in/in) 1 x Per Day/30 Days Discharge Instructions: Apply Hydrofera Blue Ready to wound bed as directed Secured With: coverlet 1 x Per Day/30 Days Reece, Lakeesha A. ([id number]) 1. In office sharp debridement 2. Dakin's solution 3. Santyl, Hydrofera Blue and antibiotic ointment 4. Follow-up in 1 week Electronic Signature(s) Signed: 09/15/2021 9:49:29 AM By: Kalman Shan DO Entered By: Kalman Shan on 09/15/2021 09:48:51 [name] ([id number]) -------------------------------------------------------------------------------- ROS/PFSH Details Patient Name: Heather Dear A. Date of Service: 09/15/2021 8:45 AM Medical Record Number: [id number] Patient Account Number: [id number] Date of Birth/Sex: 03/04/1976 (45 y.o. F) Treating RN: Cornell Barman Primary Care Provider: Raelene Bott Other Clinician: Referring Provider: Raelene Bott Treating Provider/Extender: Yaakov Guthrie in Treatment: 25 Information Obtained From Patient Ear/Nose/Mouth/Throat Medical History: Positive for: Chronic sinus problems/congestion; Middle ear problems Hematologic/Lymphatic Medical History: Positive for: Anemia Respiratory Medical History: Positive for: Chronic Obstructive Pulmonary Disease (COPD) Cardiovascular Medical History: Positive for: Congestive Heart Failure - EF 33% Endocrine Medical History: Positive for: Type II Diabetes Time with diabetes: 14 years Treated with: Insulin, Oral agents Blood sugar tested every day: No Blood sugar testing results: Bedtime: 176 Genitourinary Medical History: Positive for: End Stage Renal Disease Integumentary (Skin) Medical History: Positive for: History of pressure wounds Neurologic Medical History: Positive for: Neuropathy -  feel and legs HBO Extended History Items Ear/Nose/Mouth/Throat: Ear/Nose/Mouth/Throat: Chronic sinus problems/congestion Middle ear problems Immunizations Pneumococcal Vaccine: Received Pneumococcal Vaccination: No Henigan, Shevonne A. ([id number]) Implantable Devices No devices added Family and Social History Current every day smoker; Marital Status - Single; Alcohol Use: Never; Drug Use: Prior History; Caffeine Use: Daily Electronic Signature(s) Signed: 09/15/2021 9:49:29 AM By: Kalman Shan DO Signed: 09/15/2021 3:24:52 PM By: Gretta Cool, BSN, RN, CWS, [name] RN, BSN Entered By: Kalman Shan on 09/15/2021 09:44:33 [name] ([id number]) -------------------------------------------------------------------------------- SuperBill Details Patient Name: Heather Dear A. Date of Service: 09/15/2021 Medical Record Number: [id number] Patient Account Number: [id number] Date of Birth/Sex: [birth year] (45 y.o. F) Treating RN: Cornell Barman Primary Care Provider: Raelene Bott Other Clinician: Referring Provider: Raelene Bott Treating Provider/Extender: Yaakov Guthrie in Treatment: 25 Diagnosis Coding ICD-10 Codes Code Description [phone] 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ity L97.512 Non-pressure chronic ulcer of other part of right foot with fat layer exposed L97.812 Non-pressure chronic ulcer of other part of right lower leg with fat layer exposed E11.621 Type 2 diabetes mellitus with foot ulcer E11.42 Type 2 diabetes mellitus with diabetic polyneuropathy Facility Procedures CPT4 Code: [id number] Description: 11042 - DEB SUBQ TISSUE 20 SQ CM/< Modifier: Quantity: 1 CPT4 Code: Description: ICD-10  Diagnosis Description 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 L97.512 Non-pressure chronic ulcer of other part of right foot with fat layer expo E11.621 Type 2 diabetes mellitus with foot   ulcer Modifier: severity sed Quantity: Physician Procedures CPT4 Code: 1464314 Description: 27670 - WC PHYS SUBQ TISS 20 SQ CM Modifier: Quantity: 1 CPT4 Code: Description: ICD-10 Diagnosis Description 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 L97.512 Non-pressure chronic ulcer of other part of right foot with fat layer expo E11.621 Type 2 diabetes mellitus with foot  ulcer Modifier: severity sed Quantity: Electronic Signature(s) Signed: 09/15/2021 9:49:29 AM By: Kalman Shan DO Entered By: Kalman Shan on 09/15/2021 09:49:13

## 2021-09-15 NOTE — Progress Notes (Signed)
SUZANNE, KHO ([id number]) Visit Report for 09/15/2021 Allergy List Details Patient Name: AMARIYA, LISKEY A. Date of Service: 09/15/2021 8:45 AM Medical Record Number: [id number] Patient Account Number: [id number] Date of Birth/Sex: Sep 16, 1976 (45 y.o. F) Treating RN: Cornell Barman Primary Care Cailan General: Raelene Bott Other Clinician: Referring Lilyonna Steidle: Raelene Bott Treating Shacola Schussler/Extender: Yaakov Guthrie in Treatment: 25 Allergies Active Allergies erythromycin base Neosporin (neo-bac-polym) Allergy Notes Electronic Signature(s) Signed: 09/15/2021 3:24:52 PM By: Gretta Cool, BSN, RN, CWS, [name] RN, BSN Entered By: Gretta Cool, BSN, RN, CWS, [name] on 09/15/2021 09:39:19 [name] ([id number]) -------------------------------------------------------------------------------- Arrival Information Details Patient Name: [name] Date of Service: 09/15/2021 8:45 AM Medical Record Number: [id number] Patient Account Number: [id number] Date of Birth/Sex: April 04, 1976 (45 y.o. 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 Date of Service: 09/15/2021 8:45 AM Medical Record Number: [id number] Patient Account Number: [id number] Date of Birth/Sex: 1976-02-12 (45 y.o. F) Treating RN: Cornell Barman Primary Care Awad Gladd: Raelene Bott Other Clinician: Referring Albirtha Grinage: Raelene Bott Treating Karo Rog/Extender: Yaakov Guthrie in Treatment: 25 Palliative Management Criteria Complex Wound Management Criteria Patient has remarkable or complex co-morbidities requiring medications or treatments that extend wound healing times. Examples: o Diabetes mellitus with chronic renal failure or end stage renal disease requiring dialysis o Advanced or poorly controlled rheumatoid arthritis o Diabetes mellitus and end stage chronic obstructive pulmonary disease o Active cancer with current chemo- or radiation therapy Care Approach Wound Care Plan: Complex Wound Management Electronic Signature(s) Signed: 09/15/2021 9:49:29 AM By: Kalman Shan DO Signed: 09/15/2021 3:24:52 PM By: Gretta Cool, BSN, RN, CWS, [name] RN, BSN Entered By: Gretta Cool, BSN, RN, CWS, [name] on 09/15/2021 09:16:09 [name] ([id number]) -------------------------------------------------------------------------------- Encounter Discharge Information Details Patient Name: [name] Date of Service: 09/15/2021 8:45 AM Medical Record Number: [id number] Patient Account Number: [id number] Date of Birth/Sex: 04-Mar-1976 (45 y.o. F) Treating RN: Cornell Barman Primary Care Harles Evetts: Raelene Bott Other Clinician: Referring Kristina Mcnorton: Raelene Bott Treating Kean Gautreau/Extender: Yaakov Guthrie in Treatment: 25 Encounter Discharge Information Items Post Procedure Vitals Discharge Condition: Stable Temperature (F):  98.7 Ambulatory Status: Wheelchair Pulse (bpm): 118 Discharge Destination: Home Respiratory Rate (breaths/min): 16 Transportation: Private Auto Blood Pressure (mmHg): 162/92 Accompanied By: mom Schedule Follow-up Appointment: Yes Clinical Summary of Care: Electronic Signature(s) Signed: 09/15/2021 3:24:52 PM By: Gretta Cool, BSN, RN, CWS, [name] RN, BSN Entered By: Gretta Cool, BSN, RN, CWS, [name] on 09/15/2021 09:54:30 [name] ([id number]) -------------------------------------------------------------------------------- Lower Extremity Assessment Details Patient Name: [name] Date of Service: 09/15/2021 8:45 AM Medical Record Number: [id number] Patient Account Number: [id number] Date of Birth/Sex: September 09, 1976 (45 y.o. F) Treating RN: Cornell Barman Primary Care Charlyne Robertshaw: Raelene Bott Other Clinician: Referring Bowie Doiron: Raelene Bott Treating Steven Basso/Extender: Heber [place],  Loni Muse in Treatment: 25 Edema Assessment Assessed: [Left: Yes] [Right: Yes] Edema: [Left: No] [Right: No] Vascular Assessment Pulses: Dorsalis Pedis Palpable: [Left:Yes] [Right:Yes] Electronic Signature(s) Signed: 09/15/2021 3:24:52 PM By: Gretta Cool, BSN, RN, CWS, [name] RN, BSN Entered By: Gretta Cool, BSN, RN, CWS, [name] on 09/15/2021 09:16:27 [name] ([id number]) -------------------------------------------------------------------------------- Multi Wound Chart Details Patient Name: [name] Date of Service: 09/15/2021 8:45 AM Medical Record Number: [id number] Patient Account Number: [id number] Date of Birth/Sex: [birth year] (45 y.o. 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 Extremity Extremity Date Acquired: 08/07/2020 06/30/2021 07/18/2021 Weeks of Treatment: 25 11 8  Wound Status: Open Open Open Measurements L x W x D (cm) 5.5x3.5x0.6 1.5x0.9x0.1 0.4x0.2x0.5 Area (cm) : 15.119 1.06 0.063 Volume (cm) : 9.071 0.106 0.031 % Reduction in Area: -466.30% 83.10% 80.90% % Reduction in Volume: -579.50% 83.10% 86.60% Classification: Grade 2 Grade 1 Grade 1 Exudate Amount: Medium None Present Medium Exudate Type: Serosanguineous N/A Serosanguineous Exudate Color: red, brown N/A red, brown Debridement: Debridement - Excisional N/A Debridement - Excisional Pre-procedure Verification/Time 09:31 N/A 09:31 Out Taken: Tissue Debrided: Subcutaneous, Slough N/A Callus, Subcutaneous Level: Skin/Subcutaneous Tissue N/A Skin/Subcutaneous Tissue Debridement Area (sq cm): 12 N/A 0.12 Instrument: Curette N/A Curette Bleeding: Moderate N/A Moderate Hemostasis Achieved: Pressure N/A Pressure Debridement Treatment Procedure was tolerated well N/A Procedure was tolerated well Response: Post Debridement 5.5x3.5x0.6 N/A 0.5x0.4x0.5 Measurements L x W x D (cm) Post Debridement Volume: 9.071 N/A 0.079 (cm) Procedures Performed: Debridement N/A Debridement Treatment Notes Electronic Signature(s) Signed: 09/15/2021 9:49:29 AM By: Kalman Shan DO Entered By: Kalman Shan on 09/15/2021 [place], [place] ([id number]) [name], Alonzo AMarland Kitchen ([id number]) -------------------------------------------------------------------------------- [place] Details Patient Name: [name] Date of Service: 09/15/2021 8:45 AM Medical Record Number:  [id number] Patient Account Number: [id number] Date of Birth/Sex: 1976/08/27 (45 y.o. F) Treating RN: Cornell Barman Primary Care Makailee Nudelman: Raelene Bott Other Clinician: Referring Maylon Sailors: Raelene Bott Treating Tonimarie Gritz/Extender: Yaakov Guthrie in Treatment: 25 Active Inactive Necrotic Tissue Nursing Diagnoses: Impaired tissue integrity related to necrotic/devitalized tissue Knowledge deficit related to management of necrotic/devitalized tissue Goals: Necrotic/devitalized tissue will be minimized in the wound bed Date Initiated: 04/07/2021 Target Resolution Date: 09/08/2021 Goal Status: Active Patient/caregiver will verbalize understanding of reason and process for debridement of necrotic tissue Date Initiated: 04/07/2021 Target Resolution Date: 09/08/2021 Goal Status: Active Interventions: Assess patient pain level pre-, during and post procedure and prior to discharge Provide education on necrotic tissue and debridement process Treatment Activities: Excisional debridement : 04/07/2021 Notes: Electronic Signature(s) Signed: 09/15/2021 3:24:52 PM By: Gretta Cool, BSN, RN, CWS, [name] RN, BSN Entered By: Gretta Cool, BSN, RN, CWS, [name] on 09/15/2021 09:28:26 [name] ([id number]) -------------------------------------------------------------------------------- Pain Assessment Details Patient Name: [name] Date of Service: 09/15/2021 8:45 AM Medical Record Number: [id number] Patient Account Number: [id number] Date of Birth/Sex: 12-25-1975 (45 y.o. F) Treating RN: Cornell Barman Primary Care Loyalty Arentz: Raelene Bott Other Clinician: Referring Raelie Lohr: Raelene Bott Treating Sahiti Joswick/Extender: Yaakov Guthrie in Treatment: 25 Active Problems Location of Pain Severity and Description of Pain Patient Has Paino No  Site Locations Pain Management and Medication Current Pain Management: Notes Patient denies pain at this time. Electronic Signature(s) Signed: 09/15/2021  3:24:52 PM By: Gretta Cool, BSN, RN, CWS, [name] RN, BSN Entered By: Gretta Cool, BSN, RN, CWS, [name] on 09/15/2021 09:06:17 [name] ([id number]) -------------------------------------------------------------------------------- Patient/Caregiver Education Details Patient Name: [name] Date of Service: 09/15/2021 8:45 AM Medical Record Number: [id number] Patient Account Number: [id number] Date of Birth/Gender: [birth year] (45 y.o. F) Treating RN: Cornell Barman Primary Care Physician: Raelene Bott Other Clinician: Referring Physician: Raelene Bott Treating Physician/Extender: Yaakov Guthrie in Treatment: 25 Education Assessment Education Provided To: Patient Education Topics Provided Wound Debridement: Handouts: Wound Debridement Methods: Demonstration, Explain/Verbal Responses: State content correctly Wound/Skin Impairment: Handouts: Caring for Your Ulcer Methods: Demonstration, Explain/Verbal Responses: State content correctly Electronic Signature(s) Signed: 09/15/2021 3:24:52 PM By: Gretta Cool, BSN, RN, CWS, [name] RN, BSN Entered By: Gretta Cool, BSN, RN, CWS, [name] on 09/15/2021 09:50:30 [name] ([id number]) -------------------------------------------------------------------------------- Wound Assessment Details Patient Name: [name] Date of Service: 09/15/2021 8:45 AM Medical Record Number: [id number] Patient Account Number: [id number] Date of Birth/Sex: August 29, 1976 (45 y.o. F) Treating RN: Cornell Barman Primary Care Ekam Besson: Raelene Bott Other Clinician: Referring Leianna Barga: Raelene Bott Treating Telecia Larocque/Extender: Yaakov Guthrie in Treatment: 25 Wound Status Wound Number: 7 Primary Etiology: Diabetic Wound/Ulcer of the Lower Extremity Wound Location: Left, Midline, Plantar Foot Wound Status: Open Wounding Event: Gradually Appeared Date Acquired: 08/07/2020 Weeks Of Treatment: 25 Clustered Wound: No Photos Photo Uploaded By: Gretta Cool, BSN, RN, CWS,  [name] on 09/15/2021 09:17:16 Wound Measurements Length: (cm) 5.5 Width: (cm) 3.5 Depth: (cm) 0.6 Area: (cm) 15.119 Volume: (cm) 9.071 % Reduction in Area: -466.3% % Reduction in Volume: -579.5% Wound Description Classification: Grade 2 Exudate Amount: Medium Exudate Type: Serosanguineous Exudate Color: red, brown Treatment Notes Wound #7 (Foot) Wound Laterality: Plantar, Left, Midline Cleanser Dakin 16 (oz) 0.25 Discharge Instruction: Use as directed. Peri-Wound Care Topical Primary Dressing Gauze Discharge Instruction: As directed: moistened with Dakins Solution Secondary Dressing Secured With VERNIE, VINCIGUERRA ([id number]) Kerlix Roll Sterile or Non-Sterile 6-ply 4.5x4 (yd/yd) Discharge Instruction: Apply Kerlix as directed Compression Wrap Compression Stockings Add-Ons Electronic Signature(s) Signed: 09/15/2021 3:24:52 PM By: Gretta Cool, BSN, RN, CWS, [name] RN, BSN Entered By: Gretta Cool, BSN, RN, CWS, [name] on 09/15/2021 09:11:20 [name] ([id number]) -------------------------------------------------------------------------------- Wound Assessment Details Patient Name: [name] Date of Service: 09/15/2021 8:45 AM Medical Record Number: [id number] Patient Account Number: [id number] Date of Birth/Sex: 02/02/1976 (45 y.o. F) Treating RN: Cornell Barman Primary Care Burtis Imhoff: Raelene Bott Other Clinician: Referring Solomon Skowronek: Raelene Bott Treating Ares Cardozo/Extender: Yaakov Guthrie in Treatment: 25 Wound Status Wound Number: 8 Primary Etiology: Diabetic Wound/Ulcer of the Lower Extremity Wound Location: Right, Anterior Lower Leg Wound Status: Open Wounding Event: Gradually Appeared Date Acquired: 06/30/2021 Weeks Of Treatment: 11 Clustered Wound: No Photos Photo Uploaded By: Gretta Cool, BSN, RN, CWS, [name] on 09/15/2021 09:17:17 Wound Measurements Length: (cm) 1.5 Width: (cm) 0.9 Depth: (cm) 0.1 Area: (cm) 1.06 Volume: (cm) 0.106 % Reduction in Area:  83.1% % Reduction in Volume: 83.1% Wound Description Classification: Grade 1 Exudate Amount: None Present Treatment Notes Wound #8 (Lower Leg) Wound Laterality: Right, Anterior Cleanser Soap and Water Discharge Instruction: Gently cleanse wound with antibacterial soap, rinse and pat dry prior to dressing wounds Peri-Wound Care Topical Bacitracin Ointment, 1 (oz) tube Primary Dressing Secondary Dressing Secured With Kerlix Roll Sterile or Non-Sterile 6-ply 4.5x4 (yd/yd) Discharge Instruction: Apply Kerlix as directed Compression Wrap [place], Preston A. ([id number]) Compression Stockings Add-Ons Electronic  Signature(s) Signed: 09/15/2021 3:24:52 PM By: Gretta Cool, BSN, RN, CWS, [name] RN, BSN Entered By: Gretta Cool, BSN, RN, CWS, [name] on 09/15/2021 09:13:42 [name] ([id number]) -------------------------------------------------------------------------------- Wound Assessment Details Patient Name: [name] Date of Service: 09/15/2021 8:45 AM Medical Record Number: [id number] Patient Account Number: [id number] Date of Birth/Sex: 1976-05-17 (45 y.o. F) Treating RN: Cornell Barman Primary Care Rya Rausch: Raelene Bott Other Clinician: Referring Sanaa Zilberman: Raelene Bott Treating Keola Heninger/Extender: Yaakov Guthrie in Treatment: 25 Wound Status Wound Number: 9 Primary Etiology: Diabetic Wound/Ulcer of the Lower Extremity Wound Location: Right Calcaneus Wound Status: Open Wounding Event: Gradually Appeared Date Acquired: 07/18/2021 Weeks Of Treatment: 8 Clustered Wound: No Photos Photo Uploaded By: Gretta Cool, BSN, RN, CWS, [name] on 09/15/2021 09:17:34 Wound Measurements Length: (cm) 0.4 Width: (cm) 0.2 Depth: (cm) 0.5 Area: (cm) 0.063 Volume: (cm) 0.031 % Reduction in Area: 80.9% % Reduction in Volume: 86.6% Wound Description Classification: Grade 1 Exudate Amount: Medium Exudate Type: Serosanguineous Exudate Color: red, brown Treatment Notes Wound #9 (Calcaneus)  Wound Laterality: Right Cleanser Soap and Water Discharge Instruction: Gently cleanse wound with antibacterial soap, rinse and pat dry prior to dressing wounds Peri-Wound Care Topical Santyl Collagenase Ointment, 30 (gm), tube Discharge Instruction: apply nickel thick to wound bed only Primary Dressing Hydrofera Blue Ready Transfer Foam, 2.5x2.5 (in/in) Discharge Instruction: Apply Hydrofera Blue Ready to wound bed as directed CHARLOTTIE, PERAGINE A. ([id number]) Secondary Dressing Secured With coverlet Compression Wrap Compression Stockings Add-Ons Electronic Signature(s) Signed: 09/15/2021 3:24:52 PM By: Gretta Cool, BSN, RN, CWS, [name] RN, BSN Entered By: Gretta Cool, BSN, RN, CWS, [name] on 09/15/2021 09:13:42 [name] ([id number]) -------------------------------------------------------------------------------- [place] Details Patient Name: [name] Date of Service: 09/15/2021 8:45 AM Medical Record Number: [id number] Patient Account Number: [id number] Date of Birth/Sex: 1975/12/21 (45 y.o. F) Treating RN: Cornell Barman Primary Care Jaylan Duggar: Raelene Bott Other Clinician: Referring Ransom Nickson: Raelene Bott Treating Tabitha Riggins/Extender: Yaakov Guthrie in Treatment: 25 Vital Signs Time Taken: 09:05 Temperature (F): 98.5 Height (in): 69 Pulse (bpm): 118 Weight (lbs): 185 Respiratory Rate (breaths/min): 18 Body Mass Index (BMI): 27.3 Blood Pressure (mmHg): 162/92 Reference Range: 80 - 120 mg / dl Electronic Signature(s) Signed: 09/15/2021 3:24:52 PM By: Gretta Cool, BSN, RN, CWS, [name] RN, BSN Entered By: Gretta Cool, BSN, RN, CWS, [name] on 09/15/2021 09:05:45

## 2021-09-22 ENCOUNTER — Other Ambulatory Visit: Payer: Self-pay

## 2021-09-22 ENCOUNTER — Encounter (HOSPITAL_BASED_OUTPATIENT_CLINIC_OR_DEPARTMENT_OTHER): Payer: Medicaid Other | Admitting: Internal Medicine

## 2021-09-22 DIAGNOSIS — L97512 Non-pressure chronic ulcer of other part of right foot with fat layer exposed: Secondary | ICD-10-CM

## 2021-09-22 DIAGNOSIS — E11621 Type 2 diabetes mellitus with foot ulcer: Secondary | ICD-10-CM | POA: Diagnosis not present

## 2021-09-22 NOTE — Progress Notes (Signed)
JOZEE, HAMMER ([id number]) Visit Report for 09/22/2021 Chief Complaint Document Details Patient Name: [name], [name] Date of Service: 09/22/2021 9:00 AM Medical Record Number: [id number] Patient Account Number: [id number] Date of Birth/Sex: May 23, 1976 (45 y.o. F) Treating RN: Carlene Coria Primary Care Provider: Raelene Bott Other Clinician: Referring Provider: Raelene Bott Treating Provider/Extender: Yaakov Guthrie in Treatment: 26 Information Obtained from: Patient Chief Complaint 03/19/2021; patient referred by Dr. Luana Shu who has been looking after her left foot for quite a period of time for review of a nonhealing area in the left midfoot Electronic Signature(s) Signed: 09/22/2021 9:49:55 AM By: Kalman Shan DO Entered By: Kalman Shan on 09/22/2021 09:45:34 Rudzinski, [name] ([id number]) -------------------------------------------------------------------------------- Debridement Details Patient Name: Heather Dear A. Date of Service: 09/22/2021 9:00 AM Medical Record Number: [id number] Patient Account Number: [id number] Date of Birth/Sex: 1975-11-18 (45 y.o. F) Treating RN: Carlene Coria Primary Care Provider: Raelene Bott Other Clinician: Referring Provider: Raelene Bott Treating Provider/Extender: Yaakov Guthrie in Treatment: 26 Debridement Performed for Wound #9 Right Calcaneus Assessment: Performed By: Physician Kalman Shan, MD Debridement Type: Debridement Severity of Tissue Pre Debridement: Fat layer exposed Level of Consciousness (Pre- Awake and Alert procedure): Pre-procedure Verification/Time Out Yes - 09:25 Taken: Start Time: 09:25 Total Area Debrided (L x W): 0.3 (cm) x 0.3 (cm) = 0.09 (cm) Tissue and other material Viable, Non-Viable, Callus, Slough, Subcutaneous, Skin: Dermis , Skin: Epidermis, Slough debrided: Level: Skin/Subcutaneous Tissue Debridement Description: Excisional Instrument: Curette Bleeding:  Minimum Hemostasis Achieved: Pressure End Time: 09:27 Procedural Pain: 0 Post Procedural Pain: 0 Response to Treatment: Procedure was tolerated well Level of Consciousness (Post- Awake and Alert procedure): Post Debridement Measurements of Total Wound Length: (cm) 0.3 Width: (cm) 0.3 Depth: (cm) 0.5 Volume: (cm) 0.035 Character of Wound/Ulcer Post Debridement: Improved Severity of Tissue Post Debridement: Fat layer exposed Post Procedure Diagnosis Same as Pre-procedure Electronic Signature(s) Signed: 09/22/2021 9:49:55 AM By: Kalman Shan DO Signed: 09/22/2021 2:51:09 PM By: Carlene Coria RN Entered By: Carlene Coria on 09/22/2021 09:27:27 [name], [name] ([id number]) -------------------------------------------------------------------------------- HPI Details Patient Name: Heather Dear A. Date of Service: 09/22/2021 9:00 AM Medical Record Number: [id number] Patient Account Number: [id number] Date of Birth/Sex: 31-May-1976 (45 y.o. F) Treating RN: Carlene Coria Primary Care Provider: Raelene Bott Other Clinician: Referring Provider: Raelene Bott Treating Provider/Extender: Yaakov Guthrie in Treatment: 26 History of Present Illness HPI Description: 01/18/18-She is here for initial evaluation of the left great toe ulcer. She is a poor historian in regards to timeframe in detail. She states approximately 4 weeks ago she lacerated her toe on something in the house. She followed up with her primary care who placed her on Bactrim and ultimately a second dose of Bactrim prior to coming to wound clinic. She states she has been treating the toe with peroxide, Betadine and a Band-Aid. She did not check her blood sugar this morning but checked it yesterday morning it was 327; she is unaware of a recent A1c and there are no current records. She saw Dr. she would've orthopedics last week for an old injury to the left ankle, she states he did not see her toe, nor did she bring  it to his attention. She smokes approximately 1 pack cigarettes a day. Her social situation is concerning, she arrives this morning with her mother who appears extremely intoxicated/under the influence; her mother was asked to leave the room and be monitored by the patient's grandmother. The patient's aunt then accompanied the patient and the room throughout the  rest of the appointment. We had a lengthy discussion regarding the deleterious effects of uncontrolled hyperglycemia and smoking as it relates to wound healing and overall health. She was strongly encouraged to decrease her smoking and get her diabetes under better control. She states she is currently on a diet and has cut down her [facility] consumption. The left toe is erythematous, macerated and slightly edematous with malodor present. The edema in her left foot is below her baseline, there is no erythema streaking. We will treat her with Santyl, doxycycline; we have ordered and xray, culture and provided a Peg assist surgical shoe and cultured the wound. 01/25/18-She is here in follow-up evaluation for a left great toe ulcer and presents with an abscess to her suprapubic area. She states her blood sugars remain elevated, feeling "sick" and if levels are below 250, but she is trying. She has made no attempt to decrease her smoking stating that we "can't take away her food in her cigarettes". She has been compliant with offloading using the PEG assist you. She is using Santyl daily. the culture obtained last week grew staph aureus and Enterococcus faecalis; continues on the doxycycline and Augmentin was added on Monday. The suprapubic area has erythema, no femoral variation, purple discoloration, minimal induration, was accessed with a cotton tip applicator with sanguinopurulent drainage, this was cultured, I suspect the current antibiotic treatment will cover and we will not add anything to her current treatment plan. She was advised to go  to urgent care or ER with any change in redness, induration or fever. 02/01/18-She is here in follow-up evaluation for left great toe ulcers and a new abdominal abscess from last week. She was able to use packing until earlier this week, where she "forgot it was there". She states she was feeling ill with GI symptoms last week and was not taking her antibiotic. She states her glucose levels have been predominantly less than 200, with occasional levels between 200-250. She thinks this was contributing to her GI symptoms as they have resolved without intervention. There continues to be significant laceration to left toe, otherwise it clinically looks stable/improved. There is now less superficial opening to the lateral aspect of the great toe that was residual blister. We will transition to [facility] to all wounds, she will continue her Augmentin. If there is no change or deterioration next week for reculture. 02/08/18-She is here in follow-up evaluation for left great toe ulcer and abdominal ulcer. There is an improvement in both wounds. She has been wrapping her left toe with coban, not by our direction, which has created an area of discoloration to the medial aspect; she has been advised to NOT use coban secondary to her neuropathy. She states her glucose levels have been high over this last week ranging from 200-350, she continues to smoke. She admits to being less compliant with her offloading shoe. We will continue with same treatment plan and she will follow-up next week. 02/15/18-She is here in follow-up evaluation for left great toe ulcer and abdominal ulcer. The abdominal ulcer is epithelialized. The left great toe ulcer is improved and all injury from last week using the Coban wrap is resolved, the lateral ulcer is healed. She admits to noncompliance with wearing offloading shoe and admits to glucose levels being greater than 300 most of the week. She continues to smoke and expresses no  desire to quit. There is one area medially that probes deeper than it has historically, erythema to the toe and  dorsal foot has consistently waxed and waned. There is no overt signs of cellulitis or infection but we will culture the wound for any occult infection given the new area of depth and erythema. We will hold off on sensitivities for initiation of antibiotic therapy. 02/22/18-She is here in follow up evaluation for left great toe ulcer. There is overall significant improvement in both wound appearance, erythema and edema with changes made last week. She was not initiated on antibiotic therapy. Culture obtained last week showed oxacillin sensitive staph aureus, sensitive to clindamycin. Clindamycin has been called into the pharmacy but she has been instructed to hold off on initiation secondary to overall clinical improvement and her history of antibiotic intolerance. She has been instructed to contact the clinic with any noted changes/deterioration and the wound, erythema, edema and/or pain. She will follow-up next week. She continues to smoke and her glucose levels remain elevated >250; she admits to compliance with offloading shoe 03/01/18 on evaluation today patient appears to be doing fairly well in regard to her left first toe ulcer. She has been tolerating the dressing changes with the [facility] Dressing without complication and overall this has definitely showed signs of improvement according to records as well is what the patient tells me today. I'm very pleased in that regard. She is having no pain today 03/08/18 She is here for follow up evaluation of a left great toe ulcer. She remains non-compliant with glucose control and smoking cessation; glucose levels consistently >200. She states that she got new shoe inserts/peg assist. She admits to compliance with offloading. Since my last evaluation there is significant improvement. We will switch to prisma at this time and she will  follow up next week. She is noted to be tachycardic at this appointment, heart rate 120s; she has a history of heart rate 70-130 according to our records. She admits to extreme agitation r/t personal issues; she was advised to monitor her heartrate and contact her physician if it does not return to a more normal range (<100). She takes cardizem twice daily. 03/15/18-She is here in follow-up evaluation for left great toe ulcer. She remains noncompliant with glucose control and smoking cessation. She admits to compliance with wearing offloading shoe. The ulcer is improved/stable and we will continue with the same treatment plan and she will follow-up next week 03/22/18-She is here for evaluation for left great toe ulcer. There continues to be significant improvement despite recurrent hyperglycemia (over 500 yesterday) and she continues to smoke. She has been compliant with offloading and we will continue with same treatment plan and she will follow-up next week. 03/29/18-She is here for evaluation for left great toe ulcer. Despite continuing to smoke and uncontrolled diabetes she continues to improve. She is compliant with offloading shoe. We will continue with the same treatment plan and she will follow-up next week 04/05/18- She is here in follow up evaluation for a left great toe ulcer; she presents with small pustule to left fifth toe (resembles ant bite). She admits to compliance with wearing offloading shoe; continues to smoke or have uncontrolled blood glucose control. There is more callus than usual with evidence of bleeding; she denies known trauma. 04/12/18-She is here for evaluation of left great toe ulcer. Despite noncompliance with glycemic control and smoking she continues to make Cowdrey, Emaley A. ([id number]) improvement. She continues to wear offloading shoe. The pustule, that was identified last week, to the left fifth toe is resolved. She will follow-up in 2 weeks 05/03/18-she is  seen in  follow-up evaluation for a left great toe ulcer. She is compliant with offloading, otherwise noncompliant with glycemic control and smoking. She has plateaued and there is minimal improvement noted. We will transition to [facility], replaced the insert to her surgical shoe and she will follow-up in one week 05/10/18- She is here in follow up evaluation for a left great toe ulcer. It appears stable despite measurement change. We will continue with same treatment plan and follow up next week. 05/24/18-She is seen in follow-up evaluation for a left great toe ulcer. She remains compliant with offloading, has made significant improvement in her diet, decreasing the amount of sugar/soda. She said her recent A1c was 10.9 which is lower than. She did see a diabetic nutritionist/educator yesterday. She continues to smoke. We will continue with the same treatment plan and she'll follow-up next week. 05/31/18- She is seen in follow-up evaluation for left great toe ulcer. She continues to remain compliant with offloading, continues to make improvement in her diet, increasing her water and decreasing the amount of sugar/soda. She does continue to smoke with no desire to quit. We will apply Prisma to the depth and Hydrofera Blue over. We have not received insurance authorization for oasis. She will follow up next week. 06/07/18-She is seen in follow-up evaluation for left great toe ulcer. It has stalled according to today's measurements although base appears stable. She says she saw a diabetic educator yesterday; her average blood sugars are less than 300 which is an improvement for her. She continues to smoke and states "that's my next step" She continues with water over soda. We will order for xray, culture and reinstate ace wrap compression prior to placing apligraf for next week. She is voicing no complaints or concerns. Her dressing will change to iodoflex over the next week in preparation for  apligraf. 06/14/18-She is seen in follow-up evaluation for left great toe ulcer. Plain film x-ray performed last week was negative for osteomyelitis. Wound culture obtained last week grew strep B and OSSA; she is initiated on keflex and cefdinir today; there is erythema to the toe which could be from ace wrap compression, she has a history of wrapping too tight and has has been encouraged to maintain ace wraps that we place today. We will hold off on application of apligraf today, will apply next week after antibiotic therapy has been initiated. She admits today that she has resumed taking a shower with her foot/toe submerged in water, she has been reminded to keep foot/toe out of the bath water. She will be seen in follow up next week 06/21/18-she is seen in follow-up evaluation for left great toe ulcer. She is tolerating antibiotic therapy with no GI disturbance. The wound is stable. Apligraf was applied today. She has been decreasing her smoking, only had 4 cigarettes yesterday and 1 today. She continues being more compliant in diabetic diet. She will follow-up next week for evaluation of site, if stable will remove at 2 weeks. 06/28/18- She is here in follow up evalution. Apligraf was placed last week, she states the dressing fell off on Tuesday and she was dressing with hydrofera blue. She is healed and will be discharged from the clinic today. She has been instructed to continue with smoking cessation, continue monitoring glucose levels, offloading for an additional 4 weeks and continue with hydrofera blue for additional two weeks for any possible microscopic opening. Readmission: 08/07/18 on evaluation today patient presents for reevaluation concerning the ulcer of her right  great toe. She was previously discharged on 06/28/18 healed. Nonetheless she states that this began to show signs of drainage she subsequently went to her primary care provider. Subsequently an x-ray was performed on 08/01/18  which was negative. The patient was also placed on antibiotics at that time. Fortunately they should have been effective for the infection. Nonetheless she's been experiencing some improvement but still has a lot of drainage coming from the wound itself. 08/14/18 on evaluation today patient's wound actually does show signs of improvement in regard to the erythema at this point. She has completed the antibiotics. With that being said we did discuss the possibility of placing her in a total contact cast as of today although I think that I may want to give this just a little bit more time to ensure nothing recurrence as far as her infection is concerned. I do not want to put in the cast and risk infection at that time if things are not completely resolved. With that being said she is gonna require some debridement today. 08/21/18 on evaluation today patient actually appears to be doing okay in regard to her toe ulcer. She's been tolerating the dressing changes without complication. With that being said it does appear that she is ready and in fact I think it's appropriate for [place] to go ahead and initiate the total contact cast today. Nonetheless she will require some sharp debridement to prepare the wound for application. Overall I feel like things have been progressing well but we do need to do something to get this to close more readily. 08/24/18 patient seen today for reevaluation after having had the total contact cast applied on Tuesday. She seems to have done very well the wound appears to be doing great and overall I'm pleased with the progress that she's made. There were no abnormal areas of rubbing from the cast on her lower extremity. 08/30/18 on evaluation today patient actually appears to be completely healed in regard to her plantar toe ulcer. She tells me at this point she's been having a lot of issues with the cast. She almost fell a couple of times the state shall the step of her dog a couple  times as well. This is been a very frustrating process for her other nonetheless she has completely healed the wound which is excellent news. Overall there does not appear to be the evidence of infection at this time which is great news. 09/11/18 evaluation today patient presents for follow-up concerning her great toe ulcer on the left which has unfortunately reopened since I last saw her which was only a couple of weeks ago. Unfortunately she was not able to get in to get the shoe and potentially the AFO that's gonna be necessary due to her left foot drop. She continues with offloading shoe but this is not enough to prevent her from reopening it appears. When we last had her in the total contact cast she did well from a healing standpoint but unfortunately the wound reopened as soon as she came out of the cast within just a couple of weeks. 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 This is also part of the reason why she falls on a regular basis. I just do not believe that is gonna be  safe for [place] to reinitiate the total contact cast as last time we had this on she fell 3 times one week which is definitely not normal for her. 09/18/18 upon evaluation today the patient actually appears to be doing about the same in regard to her toe ulcer. She did not contact [employer] as I asked her to even though I had given her the prescription. In fact she actually states that she has no idea where the prescription is. She did apparently call [employer] and they told her that all she needed to do was bring the prescription in order to be able to be seen and work on getting the AFO for her left foot. With all that being said she still does not have an appointment and I'm not sure were things stand that regard. I will  give her a new prescription today in order to contact them to get this set up. 09/25/18 on evaluation today patient actually appears to be doing about the same in regard to her toes ulcer. She does have a small areas which seems to have a lot of callous buildup around the edge of the wound which is going to need sharp debridement today. She still is waiting to be scheduled for evaluation with [employer] for possibility of an AFO. She states there supposed to call her tomorrow to get this set up. Unfortunately it does appear that her foot specifically the toe area is showing signs of erythema. There does not appear to be any systemic infection which is in these good news. [name], [name] ([id number]) 10/02/18 on evaluation today patient actually appears to be doing about the same in regard to her toe ulcer. This really has not done too well although it's not significantly larger it's also not significantly smaller. She has been tolerating the dressing changes without complication. She actually has her appointment with [employer] and [place] tomorrow to hopefully be measured for obtaining and AFO splint. I think this would be helpful preventing this from reoccurring. We had contemplated starting the cast this week although to be honest I am reluctant to do that as she's been having nausea, vomiting, and seizure activity over the past three days. She has a history of seizures and have been told is nothing that can be done for these. With that being said I do believe that along with the seizures have the nausea vomiting which upon further questioning doesn't seem to be the normal for her and makes me concerned for the possibility of infection or something else going on. I discussed this with the patient and her mother during the office visit today. I do not feel the wound is effective but maybe something else. The responses this was "this just happens to her at times and we don't know why". They did not seem  to be interested in going to the hospital to have this checked out further. 10/09/18 on evaluation today patient presents for follow-up concerning her ongoing toe ulcer. She has been tolerating the dressing changes without complication. Fortunately there does not appear to be any evidence of infection which is great news however I do think that the patient would benefit from going ahead for with the total contact cast. She's actually in a wheelchair today she tells me that she will use her walker if we initiate the cast. I was very specific about the fact that if we were gonna do the cast I wanted to make sure that she was using the walker in order  to prevent any falls. She tells me she does not have stairs that she has to traverse on a regular basis at her home. She has not had any seizures since last week again that something that happens to her often she tells me she did talk to [employer] and they said that it may take up to three weeks to get the brace approved for her. Hopefully that will not take that long but nonetheless in the meantime I do think the cast could be of benefit. 10/12/18 on evaluation today patient appears to be doing rather well in regard to her toe ulcer. It's just been a few days and already this is significantly improved both as far as overall appearance and size. Fortunately there's no sign of infection. She is here for her first obligatory cast change. 10/19/18 Seen today for follow up and management of left great toe ulcer. Wound continues to show improvement. Noted small open area with seroussang drainage with palpation. Denies any increased pain or recent fevers during visit. She will continue calcium alginate with offloading shoe. Denies any questions or concerns during visit. 10/26/18 on evaluation today patient appears to be doing about the same as when I last saw her in regard to her wound bed. Fortunately there does not appear to be any signs of infection.  Unfortunately she continues to have a breakdown in regard to the toe region any time that she is not in the cast. It takes almost no time at all for this to happen. Nonetheless she still has not heard anything from the brace being made by [employer] as to when exactly this will be available to her. Fortunately there is no signs of infection at this time. 10/30/18 on evaluation today patient presents for application of the total contact cast as we just received him this morning. Fortunately we are gonna be able to apply this to her today which is great news. She continues to have no significant pain which is good news. Overall I do feel like things have been improving while she was the cast is when she doesn't have a cast that things get worse. She still has not really heard anything from [facility] regarding her brace. 11/02/18 upon evaluation today patient's wound already appears to be doing significantly better which is good news. Fortunately there does not appear to be any signs of infection also good news. Overall I do think the total contact cast as before is helping to heal this area unfortunately it's just not gonna likely keep the area closed and healed without her getting her brace at least. Again the foot drop is a significant issue for her. 11/09/18 on evaluation today patient appears to be doing excellent in regard to her toe ulcer which in fact is completely healed. Fortunately we finally got the situation squared away with the paperwork which was needed to proceed with getting her brace approved by Medicaid. I have filled that out unfortunately that information has been sent to the orthopedic office that I worked at 2 1/2 years ago and not tired Current wound care measures. Fortunately she seems to be doing very well at this time. 11/23/18 on evaluation today patient appears to be doing More Poorly Compared to Last Time I Saw Her. At [facility] She Had Completely Healed. Currently she is continuing  to have issues with reopening. She states that she just found out that the brace was approved through Medicaid now she just has to go get measured in order to  have this fitted for her and then made. Subsequently she does not have an appointment for this yet that is going to complicate things we obviously cannot put her back in the cast if we do not have everything measured because they're not gonna be able to measure her foot while she is in the cast. 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 Obviously this is unfortunate and does have me somewhat worried as well. 11/30/18 on evaluation today patient's toe ulcer actually appears to be doing fairly well. The good news is she will be getting her brace in the shoes next week on Wednesday. Hopefully we will be able to get this to heal without having to go back in the cast however she may need the cast in order to get the wound completely heal and then go from there. Fortunately there's no signs of infection at this time. 12/07/18 on evaluation today patient fortunately did receive her brace and she states she could tell this definitely makes her walk better. With that being said she's been having issues with her toe where she noticed yesterday there was a lot of tissue that was loosing off this appears to be much larger than what it was previous. She also states that her leg has been read putting much across the top of her foot just about the ankle although this seems to be receiving somewhat. The total area is still red and appears to be someone infected as best I can tell. She is previously taken Bactrim and that may be a good option for her today as well. We are gonna see what I wound culture shows as well and I think that this is definitely appropriate. With that being said outside of the culture I still need to initiate something in the interim and that's  what I'm gonna go ahead and select Bactrim is a good option for her. 12/14/18 on evaluation today patient appears to be doing better in regard to her left great toe ulcer as compared to last week's evaluation. There's still some erythema although this is significantly improved which is excellent news. Overall I do believe that she is making good progress is still gonna take some time before she is where I would like her to be from the standpoint of being able to place her back into the total contact cast. Hopefully we will be where we need to be by next week. 12/21/18 on evaluation today patient actually appears to be doing poorly in regard to her toe ulcer. She's been tolerating the dressing changes without complication. Fortunately there's no signs of systemic infection although she does have a lot of drainage from the toe ulcer and this does seem to be causing some issues at this point. She does have erythema on the distal portion of her toe that appears to be likely cellulitis. 12/28/18 on evaluation today patient actually appears to be doing a little better in my pinion in regard to her toe ulcer. With that being said she still does have some evidence of infection at this time and for her culture she had both E. coli as well as enterococcus as organisms noted on evaluation. For that reason I think that though the Keflex likely has treated the E. coli rather well this has really done nothing for the enterococcus. We are going to have to initiate treatment for this specifically. Dost,  Minie A. ([id number]) 01/04/19 on evaluation today patient's toe actually appears to be doing better from the standpoint of infection. She currently would like to see about putting the cash back on I think that this is appropriate as long as she takes care of it and keeps it from getting wet. She is gonna have some drainage we can definitely pass this up with Drawtex and alginate to try to prevent as much drainage as  possible from causing the problems. With that being said I do want to at least try her with the cast between now and Tuesday. If there any issues we can't continue to use it then I will discontinue the use of the cast at that point. 01/08/19 on evaluation today patient actually appears to be doing very well as far as her foot ulcer specifically the great toe on the left is concerned. She did have an area of rubbing on the medial aspect of her left ankle which again is from the cast. Fortunately there's no signs of infection at this point in this appears to be a very slight skin breakdown. The patient tells me she felt it rubbing but didn't think it was that bad. Fortunately there is no signs of active infection at this time which is good news. No fevers, chills, nausea, or vomiting noted at this time. 01/15/19 on evaluation today patient actually appears to be doing well in regard to her toe ulcer. Again as previous she seems to do well and she has the cast on which indicates to me that during the time she doesn't have a cast on she's putting way too much pressure on this region. Obviously I think that's gonna be an issue as with the current national emergency concerning the Covid-19 Virus it has been recommended that we discontinue the use of total contact casting by the chief medical officer of our company, Dr. Simona Huh. The reasoning is that if a patient becomes sick and cannot come into have the cast removed they could not just leave this on for an additional two weeks. Obviously the hospitals also do not want to receive patient's who are sick into the emergency department to potentially contaminate the region and spread the Covid-19 Virus among other sick individuals within the hospital system. Therefore at this point we are suspending the use of total contact cast until the current emergency subsides. This was all discussed with the patient today as well. 01/22/19 on evaluation today patient's wound  on her left great toe appears to be doing slightly worse than previously noted last week. She tells me that she has been on this quite a bit in fact she tells me she's been awake for 38 straight hours. This is due to the fact that she's having to care for grandparents because nobody else will. She has been taking care of them for five the last seven days since I've seen her they both have dementia his is from a stroke and her grandmother's was progressive. Nonetheless she states even her mom who knows her condition and situation has only help two of those days to take care of them she's been taking care of the rest. Fortunately there does not appear to be any signs of active infection in regard to her toe at this point although obviously it doesn't look as good as it did previous. I think this is directly related to her not taking off the pressure and friction by way of taking things easy. Though I completely understand what's  going on. 01/29/19 on evaluation today patient's tools are actually appears to be showing some signs of improvement today compared to last week's evaluation as far as not necessarily the overall size of the wound but the fact that she has some new skin growth in between the two ends of the wound opening. Overall I feel like she has done well she states that she had a family member give her what sounds to be a CAM walker boot which has been helpful as well. 02/05/19 on evaluation today patient's wound bed actually appears to be doing significantly better in regard to her overall appearance of the size of the wound. With that being said she is still having an issue with offloading efficiently enough to get this to close. Apparently there is some signs of infection at this point as well unfortunately. Previously she's done well of Augmentin I really do not see anything that needs to be culture currently but there theme and cellulitis of the foot that I'm seeing I'm gonna go ahead and  place her on an antibiotic today to try to help clear this up. 02/12/2019 on evaluation today patient actually appears to be doing poorly in regard to her overall wound status. She tells me she has been using her offloading shoe but actually comes in today wearing her tennis shoe with the AFO brace. Again as I previously discussed with her this is really not sufficient to allow the area to heal appropriately. Nonetheless she continues to be somewhat noncompliant and I do wonder based on what she has told my nurse in the past as to whether or not a good portion of this noncompliance may be recreational drug and alcohol related. She has had a history of heroin overdose and this was fairly recently in the past couple of months that have been seeing her. Nonetheless overall I feel like her wound looks significantly worse today compared to what it was previous. She still has significant erythema despite the Augmentin I am not sure that this is an appropriate medication for her infection I am also concerned that the infection may have gone down into her bone. 02/19/19 on evaluation today patient actually appears to be doing about the same in regard to her toe ulcer. Unfortunately she continues to show signs of bone exposure and infection at this point. There does not appear to be any evidence of worsening of the infection but I'm also not really sure that it's getting significantly better. She is on the Augmentin which should be sufficient for the Staphylococcus aureus infection that she has at this point. With that being said she may need IV antibiotics to more appropriately treat this. We did have a discussion today about hyperbaric option therapy. 02/28/19 on evaluation today patient actually appears to be doing much worse in regard to the wound on her left great toe as compared to even my previous evaluation last week. Unfortunately this seems to be training in a pretty poor direction. Her toe was actually  now starting to angle laterally and I can actually see the entire joint area of the proximal portion of the digit where is the distal portion of the digit again is no longer even in contact with the joint line. Unfortunately there's a lot more necrotic tissue around the edge and the toe appears to be showing signs of becoming gangrenous in my pinion. I'm very concerned about were things stand at this point. She did see infectious disease and they are planning to  send in a prescription for Sivextro for her and apparently this has been approved. With that being said I don't think she should avoid taking this but at the same time I'm not sure that it's gonna be sufficient to save her toe at this point. 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 03/05/19 on evaluation today patient actually appears to be doing quite well in regard to her toe all things considering. She still has bone exposed but there appears to be much less your thing on overall the appearance of the wound and the toe itself is dramatically improved. She still does have some issues currently obviously with infection she did see vascular as well and there concerned that her blood flow to the toad. For that reason they are setting up for an angiogram next week. 03/14/19 on evaluation today patient appears to be doing very poor in regard to her toe and specifically in regard to the ulceration and the fact that she's starting to notice the toe was leaning even more towards the lateral aspect and the complete joint is visible on the proximal aspect of the joint. Nonetheless she's also noted a significant odor and the tip of the toe is turning more dark and necrotic appearing. Overall I think she is getting worse not better as far as this is  concerned. For that reason I am recommending at this point that she likely needs to be seen for likely amputation. READMISSION 03/19/2021 This is a patient that we cared for in this clinic for a prolonged period of time in 2019 and 2020 with a left foot and left first toe wound. 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 ([id number]) 04/09/20 by Dr. Luana Shu. In December 2021 she had an ulcer on her right great toe as well as the fourth and fifth toes. 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 She also had an angiogram at that time and underwent angioplasty of the right anterior tibial artery. In any case she claims that the wound on the right foot is closed I did not look at this today which was probably an oversight although I think that should be done next week. After her surgery she developed a dehiscence but I do not see any follow-up of this. According to Dr. Deborra Medina last review that she was out of the area being cared for by another physician but recently came back to his attention. 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 A culture of this area showed E. coli apparently before she came back to see Dr. Luana Shu she was supposed to be receiving antibiotics but she did not really take them. Nor is she offloading this area at all. Finally her last hemoglobin A1c listed in epic was in March 2022 at 14.1 she says things are a lot better since then although I am not sure. 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 She had this wound at the time. During this admission she had E. coli sepsis a TEE was negative. Past medical history is extensive and includes type  2 diabetes with peripheral neuropathy cardiomyopathy with an ejection fraction of  33%, hypertension, hyperlipidemia chronic renal failure stage III history of substance abuse with cocaine although she claims to be clean now verified by her mother. She is still a heavy cigarette smoker. She has a history of bipolar disorder seizure disorder ABI in our clinic was 1.05 6/1; left midfoot in the setting of a TMA done previously. Round circular wound with a "knuckle" of protruding tissue. The problem is that the knuckle was not attached to any of the surrounding granulation and this probed proximally widely I removed a large portion of this tissue. This wound goes with considerable undermining laterally. I do not feel any bone there was no purulence but this is a deep wound. 6/8; in spite of the debridement I did last week. She arrives with a wound looking exactly the same. A protruding "knuckle" of tissue nonadherent to most of the surrounding tissue. There is considerable depth around this from 6-12 o'clock at 2.7 cm and undermining of 1 cm. This does not look overtly infected and the x-ray I did last week was negative for any osseous abnormalities. We have been using silver collagen 6/15; deep tissue culture I did last week showed moderate staph aureus and moderate Pseudomonas. This will definitely require prolonged antibiotic therapy. The pathology on the protuberant area was negative for malignancy fungus etc. 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 We have been using silver collagen. I am going to be prescribing Levaquin for 2 weeks. Her CT scan of the foot is down for 7/5 6/22; CT scan of the foot on 7 5. She says she has hardware in the left leg from her previous fracture. She is on the Levaquin for the deep tissue culture I did that showed methicillin sensitive staph aureus and Pseudomonas. I gave her a 2-week supply and she will have another week. She arrives in clinic today with the same protuberant tissue however  this is nonadherent to the tissue surrounding it. I am really at a loss to explain this unless there is underlying deep tissue infection 6/29; patient presents for 1 week follow-up. She has been using collagen to the wound bed. She reports taking her antibiotics as prescribed.She has no complaints or issues today. She denies signs of infection. 7/6; patient presents for one week followup. She has been using collagen to the wound bed. She states she is taking Levaquin however at times she is not able to keep it down. She denies signs of infection. 7/13; patient presents for 1 week follow-up. She has been using silver alginate to the wound bed. She still has nausea when taking her antibiotics. She denies signs of infection. 7/20; patient presents for 1 week follow-up. She has been using silver alginate with gentamicin cream to the wound bed. She denies any issues and has no complaints today. She denies signs of infection. 7/27; patient presents for 1 week follow-up. She continues to use silver alginate with gentamicin cream to the wound bed. She reports starting her antibiotics. She has no issues or complaints. Overall she reports stability to the wound. 8/3; patient presents for 1 week follow-up. She has been using silver alginate with gentamicin cream to the wound bed. She reports completing all antibiotics. She has no issues or complaints today. She denies signs of infection. 8/17; patient presents for 2-week follow-up. He is to use silver alginate to the wound bed. She has no issues or complaints  today. She denies signs of infection. She reports her pain has improved in her foot since last clinic visit 8/24; patient presents for 1 week follow-up. She continues to use silver alginate to the wound bed. She has no issues or complaints. She denies signs of infection. Pain is stable. 9/7; patient presents for follow-up. She missed her last week appointment due to feeling ill. She continues to use  silver alginate. She has a new wound to the right lower extremity that is covered in eschar. She states It occurred over the past week and has no idea how it started. She currently denies signs of infection. 9/14; patient presents for follow-up. To the left foot wound she has been using gentamicin cream and silver alginate. To the right lower extremity wound she has been keeping this covered and has not obtain Santyl. 9/21; patient presents for follow-up. She reports using gentamicin cream and silver alginate to the left foot and Santyl to the right lower extremity wound. She has no issues or complaints today. She denies signs of infection. 9/28; patient presents for follow-up. She reports a new wound to her right heel. She states this occurred a few days ago and is progressively gotten worse. She has been trying to clean the area with a Q-tip and Santyl. She reports stability in the other 2 wounds. She has been using gentamicin cream and silver alginate to the left foot and Santyl to the right lower extremity wound. 10/12; patient presents for follow-up. She reports improvement to the wound beds. She is seeing vein and vascular to discuss the potential of a left BKA. She states they are going to do an arteriogram. She continues to use silver alginate with dressing changes to her wounds. 11/2; patient presents for follow-up. She states she has not been doing dressing changes to the wound beds. She states she is not able to offload the areas. She reports chronic pain to her left foot wound. 11/9; patient presents for follow-up. She came in with only socks on. She states she forgot to put on shoes. It is unclear if she is doing any dressing changes. She currently denies systemic signs of infection. 11/16; patient presents for follow-up. She came again only with socks on. She states she does not wear shoes ever. It is unclear if she does dressing changes. She currently denies systemic signs of  infection. 11/23; patient presents for follow-up. She wore her shoes today. It still unclear exactly what dressing she is using for each wound but she did states she obtained Dakin's solution and has been using this to the left foot wound. She currently denies signs of infection. 11/30; patient presents for follow-up. She has no issues or complaints today. She currently denies signs of infection. Electronic Signature(s) [name], [name] ([id number]) Signed: 09/22/2021 9:49:55 AM By: Kalman Shan DO Entered By: Kalman Shan on 09/22/2021 09:46:08 [name] ([id number]) -------------------------------------------------------------------------------- Physical Exam Details Patient Name: Heather Dear A. Date of Service: 09/22/2021 9:00 AM Medical Record Number: [id number] Patient Account Number: [id number] Date of Birth/Sex: 1976/06/16 (45 y.o. F) Treating RN: Carlene Coria Primary Care Provider: Raelene Bott Other Clinician: Referring Provider: Raelene Bott Treating Provider/Extender: Yaakov Guthrie in Treatment: 26 Constitutional . Cardiovascular . Psychiatric . Notes Left foot (Transmetatarsal amputation): Wound to the plantar aspect that has granulation tissue With scant nonviable tissue and increased pockets of depth. Right lower extremity: To the anterior aspect there is an open wound with granulation tissue. Right foot: To the heel there is  an open wound with Granulation tissue and non viable tissue present. Circumferential callus. No obvious signs of infection to any wound. Electronic Signature(s) Signed: 09/22/2021 9:49:55 AM By: Kalman Shan DO Entered By: Kalman Shan on 09/22/2021 09:47:49 [name] ([id number]) -------------------------------------------------------------------------------- Physician Orders Details Patient Name: Heather Dear A. Date of Service: 09/22/2021 9:00 AM Medical Record Number: [id number] Patient Account  Number: [id number] Date of Birth/Sex: Apr 23, 1976 (44 y.o. F) Treating RN: Carlene Coria Primary Care Provider: Raelene Bott Other Clinician: Referring Provider: Raelene Bott Treating Provider/Extender: Yaakov Guthrie in Treatment: 47 Verbal / Phone Orders: No Diagnosis Coding Follow-up Appointments o Return Appointment in 1 week. Bathing/ Shower/ Hygiene o Wash wounds with antibacterial soap and water. Off-Loading Left Lower Extremity o Open toe surgical shoe with peg assist. o Turn and reposition every 2 hours o Other: - peg shoe right, off-loader left Medications-Please add to medication list. o P.O. Antibiotics Wound Treatment Wound #7 - Foot Wound Laterality: Plantar, Left, Midline Cleanser: Dakin 16 (oz) 0.25 1 x Per Day/30 Days Discharge Instructions: Use as directed. Primary Dressing: Gauze 1 x Per Day/30 Days Discharge Instructions: As directed: moistened with Dakins Solution Secured With: Kerlix Roll Sterile or Non-Sterile 6-ply 4.5x4 (yd/yd) 1 x Per Day/30 Days Discharge Instructions: Apply Kerlix as directed Wound #8 - Lower Leg Wound Laterality: Right, Anterior Cleanser: Soap and Water 1 x Per Day/30 Days Discharge Instructions: Gently cleanse wound with antibacterial soap, rinse and pat dry prior to dressing wounds Topical: Bacitracin Ointment, 1 (oz) tube 1 x Per Day/30 Days Secured With: Kerlix Roll Sterile or Non-Sterile 6-ply 4.5x4 (yd/yd) 1 x Per Day/30 Days Discharge Instructions: Apply Kerlix as directed Wound #9 - Calcaneus Wound Laterality: Right Cleanser: Soap and Water 1 x Per Day/30 Days Discharge Instructions: Gently cleanse wound with antibacterial soap, rinse and pat dry prior to dressing wounds Primary Dressing: Hydrofera Blue Ready Transfer Foam, 2.5x2.5 (in/in) 1 x Per Day/30 Days Discharge Instructions: Apply Hydrofera Blue Ready to wound bed as directed Secondary Dressing: Bordered Gauze Sterile-HBD 4x4 (in/in) 1 x Per  Day/30 Days Discharge Instructions: Cover wound with Bordered Guaze Sterile as directed Secured With: coverlet 1 x Per Day/30 Days Electronic Signature(s) Signed: 09/22/2021 9:49:55 AM By: Kalman Shan DO Signed: 09/22/2021 2:51:09 PM By: Carlene Coria RN Entered By: Carlene Coria on 09/22/2021 09:29:23 [name] ([id number]) [name], Starleen A. ([id number]) -------------------------------------------------------------------------------- Problem List Details Patient Name: Heather Dear A. Date of Service: 09/22/2021 9:00 AM Medical Record Number: [id number] Patient Account Number: [id number] Date of Birth/Sex: Mar 04, 1976 (45 y.o. 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 03/19/2021 No Yes severity L97.512 Non-pressure chronic ulcer of other part of right foot with fat layer 09/15/2021 No Yes exposed L97.812 Non-pressure chronic ulcer of other part of right lower leg with fat layer 09/15/2021 No Yes exposed E11.621 Type 2 diabetes mellitus with foot ulcer 03/19/2021 No Yes E11.42 Type 2 diabetes mellitus with diabetic polyneuropathy 03/19/2021 No Yes Inactive Problems ICD-10 Code Description Active Date Inactive Date S81.801A Unspecified open wound, right lower leg, initial encounter 06/30/2021 06/30/2021 Resolved Problems Electronic Signature(s) Signed: 09/22/2021 9:49:55 AM By: Kalman Shan DO Entered By: Kalman Shan on 09/22/2021 09:44:11 [name], [name] ([id number]) -------------------------------------------------------------------------------- Progress Note Details Patient Name: Heather Dear A. Date of Service: 09/22/2021  9:00 AM Medical Record Number:  [id number] Patient Account Number: [id number] Date of Birth/Sex: 12-Apr-1976 (45 y.o. F) Treating RN: Carlene Coria Primary Care Provider: Raelene Bott Other Clinician: Referring Provider: Raelene Bott Treating Provider/Extender: Yaakov Guthrie in Treatment: 26 Subjective Chief Complaint Information obtained from Patient 03/19/2021; patient referred by Dr. Luana Shu who has been looking after her left foot for quite a period of time for review of a nonhealing area in the left midfoot History of Present Illness (HPI) 01/18/18-She is here for initial evaluation of the left great toe ulcer. She is a poor historian in regards to timeframe in detail. She states approximately 4 weeks ago she lacerated her toe on something in the house. She followed up with her primary care who placed her on Bactrim and ultimately a second dose of Bactrim prior to coming to wound clinic. She states she has been treating the toe with peroxide, Betadine and a Band-Aid. She did not check her blood sugar this morning but checked it yesterday morning it was 327; she is unaware of a recent A1c and there are no current records. She saw Dr. she would've orthopedics last week for an old injury to the left ankle, she states he did not see her toe, nor did she bring it to his attention. She smokes approximately 1 pack cigarettes a day. Her social situation is concerning, she arrives this morning with her mother who appears extremely intoxicated/under the influence; her mother was asked to leave the room and be monitored by the patient's grandmother. The patient's aunt then accompanied the patient and the room throughout the rest of the appointment. We had a lengthy discussion regarding the deleterious effects of uncontrolled hyperglycemia and smoking as it relates to wound healing and overall health. She was strongly encouraged to decrease her smoking and get her diabetes under better control. She states she is currently on a  diet and has cut down her [facility] consumption. The left toe is erythematous, macerated and slightly edematous with malodor present. The edema in her left foot is below her baseline, there is no erythema streaking. We will treat her with Santyl, doxycycline; we have ordered and xray, culture and provided a Peg assist surgical shoe and cultured the wound. 01/25/18-She is here in follow-up evaluation for a left great toe ulcer and presents with an abscess to her suprapubic area. She states her blood sugars remain elevated, feeling "sick" and if levels are below 250, but she is trying. She has made no attempt to decrease her smoking stating that we "can't take away her food in her cigarettes". She has been compliant with offloading using the PEG assist you. She is using Santyl daily. the culture obtained last week grew staph aureus and Enterococcus faecalis; continues on the doxycycline and Augmentin was added on Monday. The suprapubic area has erythema, no femoral variation, purple discoloration, minimal induration, was accessed with a cotton tip applicator with sanguinopurulent drainage, this was cultured, I suspect the current antibiotic treatment will cover and we will not add anything to her current treatment plan. She was advised to go to urgent care or ER with any change in redness, induration or fever. 02/01/18-She is here in follow-up evaluation for left great toe ulcers and a new abdominal abscess from last week. She was able to use packing until earlier this week, where she "forgot it was there". She states she was feeling ill with GI symptoms last week and was not taking her antibiotic. She states her glucose  levels have been predominantly less than 200, with occasional levels between 200-250. She thinks this was contributing to her GI symptoms as they have resolved without intervention. There continues to be significant laceration to left toe, otherwise it clinically looks stable/improved.  There is now less superficial opening to the lateral aspect of the great toe that was residual blister. We will transition to [facility] to all wounds, she will continue her Augmentin. If there is no change or deterioration next week for reculture. 02/08/18-She is here in follow-up evaluation for left great toe ulcer and abdominal ulcer. There is an improvement in both wounds. She has been wrapping her left toe with coban, not by our direction, which has created an area of discoloration to the medial aspect; she has been advised to NOT use coban secondary to her neuropathy. She states her glucose levels have been high over this last week ranging from 200-350, she continues to smoke. She admits to being less compliant with her offloading shoe. We will continue with same treatment plan and she will follow-up next week. 02/15/18-She is here in follow-up evaluation for left great toe ulcer and abdominal ulcer. The abdominal ulcer is epithelialized. The left great toe ulcer is improved and all injury from last week using the Coban wrap is resolved, the lateral ulcer is healed. She admits to noncompliance with wearing offloading shoe and admits to glucose levels being greater than 300 most of the week. She continues to smoke and expresses no desire to quit. There is one area medially that probes deeper than it has historically, erythema to the toe and dorsal foot has consistently waxed and waned. There is no overt signs of cellulitis or infection but we will culture the wound for any occult infection given the new area of depth and erythema. We will hold off on sensitivities for initiation of antibiotic therapy. 02/22/18-She is here in follow up evaluation for left great toe ulcer. There is overall significant improvement in both wound appearance, erythema and edema with changes made last week. She was not initiated on antibiotic therapy. Culture obtained last week showed oxacillin sensitive staph aureus,  sensitive to clindamycin. Clindamycin has been called into the pharmacy but she has been instructed to hold off on initiation secondary to overall clinical improvement and her history of antibiotic intolerance. She has been instructed to contact the clinic with any noted changes/deterioration and the wound, erythema, edema and/or pain. She will follow-up next week. She continues to smoke and her glucose levels remain elevated >250; she admits to compliance with offloading shoe 03/01/18 on evaluation today patient appears to be doing fairly well in regard to her left first toe ulcer. She has been tolerating the dressing changes with the [facility] Dressing without complication and overall this has definitely showed signs of improvement according to records as well is what the patient tells me today. I'm very pleased in that regard. She is having no pain today 03/08/18 She is here for follow up evaluation of a left great toe ulcer. She remains non-compliant with glucose control and smoking cessation; glucose levels consistently >200. She states that she got new shoe inserts/peg assist. She admits to compliance with offloading. Since my last evaluation there is significant improvement. We will switch to prisma at this time and she will follow up next week. She is noted to be tachycardic at this appointment, heart rate 120s; she has a history of heart rate 70-130 according to our records. She admits to extreme agitation r/t  personal issues; she was advised to monitor her heartrate and contact her physician if it does not return to a more normal range (<100). She takes cardizem twice daily. 03/15/18-She is here in follow-up evaluation for left great toe ulcer. She remains noncompliant with glucose control and smoking cessation. She admits to compliance with wearing offloading shoe. The ulcer is improved/stable and we will continue with the same treatment plan and she will follow-up next week 03/22/18-She  is here for evaluation for left great toe ulcer. There continues to be significant improvement despite recurrent hyperglycemia (over 500 yesterday) and she continues to smoke. She has been compliant with offloading and we will continue with same treatment plan and she will follow-up next week. Falkner, Natalyah A. ([id number]) 03/29/18-She is here for evaluation for left great toe ulcer. Despite continuing to smoke and uncontrolled diabetes she continues to improve. She is compliant with offloading shoe. We will continue with the same treatment plan and she will follow-up next week 04/05/18- She is here in follow up evaluation for a left great toe ulcer; she presents with small pustule to left fifth toe (resembles ant bite). She admits to compliance with wearing offloading shoe; continues to smoke or have uncontrolled blood glucose control. There is more callus than usual with evidence of bleeding; she denies known trauma. 04/12/18-She is here for evaluation of left great toe ulcer. Despite noncompliance with glycemic control and smoking she continues to make improvement. She continues to wear offloading shoe. The pustule, that was identified last week, to the left fifth toe is resolved. She will follow-up in 2 weeks 05/03/18-she is seen in follow-up evaluation for a left great toe ulcer. She is compliant with offloading, otherwise noncompliant with glycemic control and smoking. She has plateaued and there is minimal improvement noted. We will transition to [facility], replaced the insert to her surgical shoe and she will follow-up in one week 05/10/18- She is here in follow up evaluation for a left great toe ulcer. It appears stable despite measurement change. We will continue with same treatment plan and follow up next week. 05/24/18-She is seen in follow-up evaluation for a left great toe ulcer. She remains compliant with offloading, has made significant improvement in her diet, decreasing the amount of  sugar/soda. She said her recent A1c was 10.9 which is lower than. She did see a diabetic nutritionist/educator yesterday. She continues to smoke. We will continue with the same treatment plan and she'll follow-up next week. 05/31/18- She is seen in follow-up evaluation for left great toe ulcer. She continues to remain compliant with offloading, continues to make improvement in her diet, increasing her water and decreasing the amount of sugar/soda. She does continue to smoke with no desire to quit. We will apply Prisma to the depth and Hydrofera Blue over. We have not received insurance authorization for oasis. She will follow up next week. 06/07/18-She is seen in follow-up evaluation for left great toe ulcer. It has stalled according to today's measurements although base appears stable. She says she saw a diabetic educator yesterday; her average blood sugars are less than 300 which is an improvement for her. She continues to smoke and states "that's my next step" She continues with water over soda. We will order for xray, culture and reinstate ace wrap compression prior to placing apligraf for next week. She is voicing no complaints or concerns. Her dressing will change to iodoflex over the next week in preparation for apligraf. 06/14/18-She is seen in follow-up evaluation for  left great toe ulcer. Plain film x-ray performed last week was negative for osteomyelitis. Wound culture obtained last week grew strep B and OSSA; she is initiated on keflex and cefdinir today; there is erythema to the toe which could be from ace wrap compression, she has a history of wrapping too tight and has has been encouraged to maintain ace wraps that we place today. We will hold off on application of apligraf today, will apply next week after antibiotic therapy has been initiated. She admits today that she has resumed taking a shower with her foot/toe submerged in water, she has been reminded to keep foot/toe out of the bath  water. She will be seen in follow up next week 06/21/18-she is seen in follow-up evaluation for left great toe ulcer. She is tolerating antibiotic therapy with no GI disturbance. The wound is stable. Apligraf was applied today. She has been decreasing her smoking, only had 4 cigarettes yesterday and 1 today. She continues being more compliant in diabetic diet. She will follow-up next week for evaluation of site, if stable will remove at 2 weeks. 06/28/18- She is here in follow up evalution. Apligraf was placed last week, she states the dressing fell off on Tuesday and she was dressing with hydrofera blue. She is healed and will be discharged from the clinic today. She has been instructed to continue with smoking cessation, continue monitoring glucose levels, offloading for an additional 4 weeks and continue with hydrofera blue for additional two weeks for any possible microscopic opening. Readmission: 08/07/18 on evaluation today patient presents for reevaluation concerning the ulcer of her right great toe. She was previously discharged on 06/28/18 healed. Nonetheless she states that this began to show signs of drainage she subsequently went to her primary care provider. Subsequently an x-ray was performed on 08/01/18 which was negative. The patient was also placed on antibiotics at that time. Fortunately they should have been effective for the infection. Nonetheless she's been experiencing some improvement but still has a lot of drainage coming from the wound itself. 08/14/18 on evaluation today patient's wound actually does show signs of improvement in regard to the erythema at this point. She has completed the antibiotics. With that being said we did discuss the possibility of placing her in a total contact cast as of today although I think that I may want to give this just a little bit more time to ensure nothing recurrence as far as her infection is concerned. I do not want to put in the cast and  risk infection at that time if things are not completely resolved. With that being said she is gonna require some debridement today. 08/21/18 on evaluation today patient actually appears to be doing okay in regard to her toe ulcer. She's been tolerating the dressing changes without complication. With that being said it does appear that she is ready and in fact I think it's appropriate for [place] to go ahead and initiate the total contact cast today. Nonetheless she will require some sharp debridement to prepare the wound for application. Overall I feel like things have been progressing well but we do need to do something to get this to close more readily. 08/24/18 patient seen today for reevaluation after having had the total contact cast applied on Tuesday. She seems to have done very well the wound appears to be doing great and overall I'm pleased with the progress that she's made. There were no abnormal areas of rubbing from the cast on her lower  extremity. 08/30/18 on evaluation today patient actually appears to be completely healed in regard to her plantar toe ulcer. She tells me at this point she's been having a lot of issues with the cast. She almost fell a couple of times the state shall the step of her dog a couple times as well. This is been a very frustrating process for her other nonetheless she has completely healed the wound which is excellent news. Overall there does not appear to be the evidence of infection at this time which is great news. 09/11/18 evaluation today patient presents for follow-up concerning her great toe ulcer on the left which has unfortunately reopened since I last saw her which was only a couple of weeks ago. Unfortunately she was not able to get in to get the shoe and potentially the AFO that's gonna be necessary due to her left foot drop. She continues with offloading shoe but this is not enough to prevent her from reopening it appears. When we last had her in the  total contact cast she did well from a healing standpoint but unfortunately the wound reopened as soon as she came out of the cast within just a couple of weeks. 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 This is also part of the reason why she falls on a regular basis. I just do not believe that is gonna be safe for [place] to reinitiate the total contact cast as last time we had this on she fell 3 times one week which is definitely not normal for her. 09/18/18 upon evaluation today the patient actually appears to be doing about the same in regard to her toe ulcer. She did not contact [employer] as I asked her to even though I had given her the prescription. In fact she actually states that she has no idea where the prescription is. She did apparently call [employer] and they told her that all she needed to do was bring the prescription in order to be able to be seen and work on getting the AFO for her left foot. With all that being said she still does not have an appointment and I'm not sure were things stand that regard. I will give her a new prescription today in order to contact them to get this set up. [name], [name] ([id number]) 09/25/18 on evaluation today patient actually appears to be doing about the same in regard to her toes ulcer. She does have a small areas which seems to have a lot of callous buildup around the edge of the wound which is going to need sharp debridement today. She still is waiting to be scheduled for evaluation with [employer] for possibility of an AFO. She states there supposed to call her tomorrow to get this set up. Unfortunately it does appear that her foot specifically the toe area is showing signs of erythema. There does not appear to be any systemic infection which is in these good  news. 10/02/18 on evaluation today patient actually appears to be doing about the same in regard to her toe ulcer. This really has not done too well although it's not significantly larger it's also not significantly smaller. She has been tolerating the dressing changes without complication. She actually has her appointment with [employer] and [place]  tomorrow to hopefully be measured for obtaining and AFO splint. I think this would be helpful preventing this from reoccurring. We had contemplated starting the cast this week although to be honest I am reluctant to do that as she's been having nausea, vomiting, and seizure activity over the past three days. She has a history of seizures and have been told is nothing that can be done for these. With that being said I do believe that along with the seizures have the nausea vomiting which upon further questioning doesn't seem to be the normal for her and makes me concerned for the possibility of infection or something else going on. I discussed this with the patient and her mother during the office visit today. I do not feel the wound is effective but maybe something else. The responses this was "this just happens to her at times and we don't know why". They did not seem to be interested in going to the hospital to have this checked out further. 10/09/18 on evaluation today patient presents for follow-up concerning her ongoing toe ulcer. She has been tolerating the dressing changes without complication. Fortunately there does not appear to be any evidence of infection which is great news however I do think that the patient would benefit from going ahead for with the total contact cast. She's actually in a wheelchair today she tells me that she will use her walker if we initiate the cast. I was very specific about the fact that if we were gonna do the cast I wanted to make sure that she was using the walker in order to prevent any falls. She tells me she does  not have stairs that she has to traverse on a regular basis at her home. She has not had any seizures since last week again that something that happens to her often she tells me she did talk to [employer] and they said that it may take up to three weeks to get the brace approved for her. Hopefully that will not take that long but nonetheless in the meantime I do think the cast could be of benefit. 10/12/18 on evaluation today patient appears to be doing rather well in regard to her toe ulcer. It's just been a few days and already this is significantly improved both as far as overall appearance and size. Fortunately there's no sign of infection. She is here for her first obligatory cast change. 10/19/18 Seen today for follow up and management of left great toe ulcer. Wound continues to show improvement. Noted small open area with seroussang drainage with palpation. Denies any increased pain or recent fevers during visit. She will continue calcium alginate with offloading shoe. Denies any questions or concerns during visit. 10/26/18 on evaluation today patient appears to be doing about the same as when I last saw her in regard to her wound bed. Fortunately there does not appear to be any signs of infection. Unfortunately she continues to have a breakdown in regard to the toe region any time that she is not in the cast. It takes almost no time at all for this to happen. Nonetheless she still has not heard anything from the brace being made by [employer] as to when exactly this will be available to her. Fortunately there is no signs of infection at this time. 10/30/18 on evaluation today patient presents for application of the total contact cast as we just received him this morning. Fortunately we are gonna be able to apply this to her  today which is great news. She continues to have no significant pain which is good news. Overall I do feel like things have been improving while she was the cast is when she doesn't  have a cast that things get worse. She still has not really heard anything from [place] regarding her brace. 11/02/18 upon evaluation today patient's wound already appears to be doing significantly better which is good news. Fortunately there does not appear to be any signs of infection also good news. Overall I do think the total contact cast as before is helping to heal this area unfortunately it's just not gonna likely keep the area closed and healed without her getting her brace at least. Again the foot drop is a significant issue for her. 11/09/18 on evaluation today patient appears to be doing excellent in regard to her toe ulcer which in fact is completely healed. Fortunately we finally got the situation squared away with the paperwork which was needed to proceed with getting her brace approved by Medicaid. I have filled that out unfortunately that information has been sent to the orthopedic office that I worked at 2 1/2 years ago and not tired Current wound care measures. Fortunately she seems to be doing very well at this time. 11/23/18 on evaluation today patient appears to be doing More Poorly Compared to Last Time I Saw Her. At [facility] She Had Completely Healed. Currently she is continuing to have issues with reopening. She states that she just found out that the brace was approved through Medicaid now she just has to go get measured in order to have this fitted for her and then made. Subsequently she does not have an appointment for this yet that is going to complicate things we obviously cannot put her back in the cast if we do not have everything measured because they're not gonna be able to measure her foot while she is in the cast. 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 Obviously this is unfortunate and does have me somewhat worried as well. 11/30/18 on evaluation today patient's toe  ulcer actually appears to be doing fairly well. The good news is she will be getting her brace in the shoes next week on Wednesday. Hopefully we will be able to get this to heal without having to go back in the cast however she may need the cast in order to get the wound completely heal and then go from there. Fortunately there's no signs of infection at this time. 12/07/18 on evaluation today patient fortunately did receive her brace and she states she could tell this definitely makes her walk better. With that being said she's been having issues with her toe where she noticed yesterday there was a lot of tissue that was loosing off this appears to be much larger than what it was previous. She also states that her leg has been read putting much across the top of her foot just about the ankle although this seems to be receiving somewhat. The total area is still red and appears to be someone infected as best I can tell. She is previously taken Bactrim and that may be a good option for her today as well. We are gonna see what I wound culture shows as well and I think that this is definitely appropriate. With that being said outside of the culture I still need to initiate  something in the interim and that's what I'm gonna go ahead and select Bactrim is a good option for her. 12/14/18 on evaluation today patient appears to be doing better in regard to her left great toe ulcer as compared to last week's evaluation. There's still some erythema although this is significantly improved which is excellent news. Overall I do believe that she is making good progress is still gonna take some time before she is where I would like her to be from the standpoint of being able to place her back into the total contact cast. Hopefully we will be where we need to be by next week. 12/21/18 on evaluation today patient actually appears to be doing poorly in regard to her toe ulcer. She's been tolerating the dressing  changes without complication. Fortunately there's no signs of systemic infection although she does have a lot of drainage from the toe ulcer and this does Dickman, Kahla A. ([id number]) seem to be causing some issues at this point. She does have erythema on the distal portion of her toe that appears to be likely cellulitis. 12/28/18 on evaluation today patient actually appears to be doing a little better in my pinion in regard to her toe ulcer. With that being said she still does have some evidence of infection at this time and for her culture she had both E. coli as well as enterococcus as organisms noted on evaluation. For that reason I think that though the Keflex likely has treated the E. coli rather well this has really done nothing for the enterococcus. We are going to have to initiate treatment for this specifically. 01/04/19 on evaluation today patient's toe actually appears to be doing better from the standpoint of infection. She currently would like to see about putting the cash back on I think that this is appropriate as long as she takes care of it and keeps it from getting wet. She is gonna have some drainage we can definitely pass this up with Drawtex and alginate to try to prevent as much drainage as possible from causing the problems. With that being said I do want to at least try her with the cast between now and Tuesday. If there any issues we can't continue to use it then I will discontinue the use of the cast at that point. 01/08/19 on evaluation today patient actually appears to be doing very well as far as her foot ulcer specifically the great toe on the left is concerned. She did have an area of rubbing on the medial aspect of her left ankle which again is from the cast. Fortunately there's no signs of infection at this point in this appears to be a very slight skin breakdown. The patient tells me she felt it rubbing but didn't think it was that bad. Fortunately there is no signs of  active infection at this time which is good news. No fevers, chills, nausea, or vomiting noted at this time. 01/15/19 on evaluation today patient actually appears to be doing well in regard to her toe ulcer. Again as previous she seems to do well and she has the cast on which indicates to me that during the time she doesn't have a cast on she's putting way too much pressure on this region. Obviously I think that's gonna be an issue as with the current national emergency concerning the Covid-19 Virus it has been recommended that we discontinue the use of total contact casting by the chief medical officer of our company, Dr.  Simona Huh. The reasoning is that if a patient becomes sick and cannot come into have the cast removed they could not just leave this on for an additional two weeks. Obviously the hospitals also do not want to receive patient's who are sick into the emergency department to potentially contaminate the region and spread the Covid-19 Virus among other sick individuals within the hospital system. Therefore at this point we are suspending the use of total contact cast until the current emergency subsides. This was all discussed with the patient today as well. 01/22/19 on evaluation today patient's wound on her left great toe appears to be doing slightly worse than previously noted last week. She tells me that she has been on this quite a bit in fact she tells me she's been awake for 38 straight hours. This is due to the fact that she's having to care for grandparents because nobody else will. She has been taking care of them for five the last seven days since I've seen her they both have dementia his is from a stroke and her grandmother's was progressive. Nonetheless she states even her mom who knows her condition and situation has only help two of those days to take care of them she's been taking care of the rest. Fortunately there does not appear to be any signs of active infection in regard  to her toe at this point although obviously it doesn't look as good as it did previous. I think this is directly related to her not taking off the pressure and friction by way of taking things easy. Though I completely understand what's going on. 01/29/19 on evaluation today patient's tools are actually appears to be showing some signs of improvement today compared to last week's evaluation as far as not necessarily the overall size of the wound but the fact that she has some new skin growth in between the two ends of the wound opening. Overall I feel like she has done well she states that she had a family member give her what sounds to be a CAM walker boot which has been helpful as well. 02/05/19 on evaluation today patient's wound bed actually appears to be doing significantly better in regard to her overall appearance of the size of the wound. With that being said she is still having an issue with offloading efficiently enough to get this to close. Apparently there is some signs of infection at this point as well unfortunately. Previously she's done well of Augmentin I really do not see anything that needs to be culture currently but there theme and cellulitis of the foot that I'm seeing I'm gonna go ahead and place her on an antibiotic today to try to help clear this up. 02/12/2019 on evaluation today patient actually appears to be doing poorly in regard to her overall wound status. She tells me she has been using her offloading shoe but actually comes in today wearing her tennis shoe with the AFO brace. Again as I previously discussed with her this is really not sufficient to allow the area to heal appropriately. Nonetheless she continues to be somewhat noncompliant and I do wonder based on what she has told my nurse in the past as to whether or not a good portion of this noncompliance may be recreational drug and alcohol related. She has had a history of heroin overdose and this was fairly recently  in the past couple of months that have been seeing her. Nonetheless overall I feel like her wound looks  significantly worse today compared to what it was previous. She still has significant erythema despite the Augmentin I am not sure that this is an appropriate medication for her infection I am also concerned that the infection may have gone down into her bone. 02/19/19 on evaluation today patient actually appears to be doing about the same in regard to her toe ulcer. Unfortunately she continues to show signs of bone exposure and infection at this point. There does not appear to be any evidence of worsening of the infection but I'm also not really sure that it's getting significantly better. She is on the Augmentin which should be sufficient for the Staphylococcus aureus infection that she has at this point. With that being said she may need IV antibiotics to more appropriately treat this. We did have a discussion today about hyperbaric option therapy. 02/28/19 on evaluation today patient actually appears to be doing much worse in regard to the wound on her left great toe as compared to even my previous evaluation last week. Unfortunately this seems to be training in a pretty poor direction. Her toe was actually now starting to angle laterally and I can actually see the entire joint area of the proximal portion of the digit where is the distal portion of the digit again is no longer even in contact with the joint line. Unfortunately there's a lot more necrotic tissue around the edge and the toe appears to be showing signs of becoming gangrenous in my pinion. I'm very concerned about were things stand at this point. She did see infectious disease and they are planning to send in a prescription for Sivextro for her and apparently this has been approved. With that being said I don't think she should avoid taking this but at the same time I'm not sure that it's gonna be sufficient to save her toe at this  point. 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 03/05/19 on evaluation today patient actually appears to be doing quite well in regard to her toe all things considering. She still has bone exposed but there appears to be much less your thing on overall the appearance of the wound and the toe itself is dramatically improved. She still does have some issues currently obviously with infection she did see vascular as well and there concerned that her blood flow to the toad. For that reason they are setting up for an angiogram next week. 03/14/19 on evaluation today patient appears to be doing very poor in regard to her toe and specifically in regard to the ulceration and the fact that she's starting to notice the toe was leaning even more towards the lateral aspect and the complete joint is visible on the proximal aspect of the joint. Nonetheless she's also noted a significant odor and the tip of the toe is turning more dark and necrotic appearing. Overall I think she is getting worse not better as far as this is concerned. For that reason I am recommending at this point that she likely needs to be seen for likely amputation. [name], [name] ([id number]) READMISSION 03/19/2021 This is a patient that we cared for in this clinic for a prolonged period of time in 2019 and 2020 with a left foot and left first toe wound. I believe  she ultimately became infected and underwent a left first toe amputation. 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 Luana Shu. In December 2021 she had an ulcer on her right great toe as well as the fourth and fifth toes. 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 She also had an angiogram at that time and  underwent angioplasty of the right anterior tibial artery. In any case she claims that the wound on the right foot is closed I did not look at this today which was probably an oversight although I think that should be done next week. After her surgery she developed a dehiscence but I do not see any follow-up of this. According to Dr. Deborra Medina last review that she was out of the area being cared for by another physician but recently came back to his attention. 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 A culture of this area showed E. coli apparently before she came back to see Dr. Luana Shu she was supposed to be receiving antibiotics but she did not really take them. Nor is she offloading this area at all. Finally her last hemoglobin A1c listed in epic was in March 2022 at 14.1 she says things are a lot better since then although I am not sure. 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 She had this wound at the time. During this admission she had E. coli sepsis a TEE was negative. 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 She is still a heavy cigarette smoker. She has a history of bipolar disorder seizure disorder ABI in our clinic was 1.05 6/1; left midfoot in the setting of a TMA done previously. Round circular wound with a "knuckle" of protruding tissue. The problem is that the knuckle was not attached to any of the surrounding granulation and this probed proximally widely I removed a large portion of this tissue. This wound goes with considerable undermining laterally. I do not feel any bone there was no purulence but this is a deep wound. 6/8; in  spite of the debridement I did last week. She arrives with a wound looking exactly the same. A protruding "knuckle" of tissue nonadherent to most of the surrounding tissue. There is considerable depth around this from 6-12 o'clock at 2.7 cm and undermining of 1 cm. This does not look overtly infected and the x-ray I did last week was negative for any osseous abnormalities. We have been using silver collagen 6/15; deep tissue culture I did last week showed moderate staph aureus and moderate Pseudomonas. This will definitely require prolonged antibiotic therapy. The pathology on the protuberant area was negative for malignancy fungus etc. 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 We have been using silver collagen. I am going to be prescribing Levaquin for 2 weeks. Her CT scan of the foot is down for 7/5 6/22; CT scan of the foot on 7 5. She says she has hardware in the left leg from her previous fracture. She is on the Levaquin for the deep tissue culture I did that showed methicillin sensitive staph aureus and Pseudomonas. I gave her a 2-week  supply and she will have another week. She arrives in clinic today with the same protuberant tissue however this is nonadherent to the tissue surrounding it. I am really at a loss to explain this unless there is underlying deep tissue infection 6/29; patient presents for 1 week follow-up. She has been using collagen to the wound bed. She reports taking her antibiotics as prescribed.She has no complaints or issues today. She denies signs of infection. 7/6; patient presents for one week followup. She has been using collagen to the wound bed. She states she is taking Levaquin however at times she is not able to keep it down. She denies signs of infection. 7/13; patient presents for 1 week follow-up. She has been using silver alginate to the wound bed. She still has nausea when taking her antibiotics. She  denies signs of infection. 7/20; patient presents for 1 week follow-up. She has been using silver alginate with gentamicin cream to the wound bed. She denies any issues and has no complaints today. She denies signs of infection. 7/27; patient presents for 1 week follow-up. She continues to use silver alginate with gentamicin cream to the wound bed. She reports starting her antibiotics. She has no issues or complaints. Overall she reports stability to the wound. 8/3; patient presents for 1 week follow-up. She has been using silver alginate with gentamicin cream to the wound bed. She reports completing all antibiotics. She has no issues or complaints today. She denies signs of infection. 8/17; patient presents for 2-week follow-up. He is to use silver alginate to the wound bed. She has no issues or complaints today. She denies signs of infection. She reports her pain has improved in her foot since last clinic visit 8/24; patient presents for 1 week follow-up. She continues to use silver alginate to the wound bed. She has no issues or complaints. She denies signs of infection. Pain is stable. 9/7; patient presents for follow-up. She missed her last week appointment due to feeling ill. She continues to use silver alginate. She has a new wound to the right lower extremity that is covered in eschar. She states It occurred over the past week and has no idea how it started. She currently denies signs of infection. 9/14; patient presents for follow-up. To the left foot wound she has been using gentamicin cream and silver alginate. To the right lower extremity wound she has been keeping this covered and has not obtain Santyl. 9/21; patient presents for follow-up. She reports using gentamicin cream and silver alginate to the left foot and Santyl to the right lower extremity wound. She has no issues or complaints today. She denies signs of infection. 9/28; patient presents for follow-up. She reports a new  wound to her right heel. She states this occurred a few days ago and is progressively gotten worse. She has been trying to clean the area with a Q-tip and Santyl. She reports stability in the other 2 wounds. She has been using gentamicin cream and silver alginate to the left foot and Santyl to the right lower extremity wound. 10/12; patient presents for follow-up. She reports improvement to the wound beds. She is seeing vein and vascular to discuss the potential of a left BKA. She states they are going to do an arteriogram. She continues to use silver alginate with dressing changes to her wounds. 11/2; patient presents for follow-up. She states she has not been doing dressing changes to the wound beds. She states she is not able to offload the  areas. She reports chronic pain to her left foot wound. 11/9; patient presents for follow-up. She came in with only socks on. She states she forgot to put on shoes. It is unclear if she is doing any dressing changes. She currently denies systemic signs of infection. 11/16; patient presents for follow-up. She came again only with socks on. She states she does not wear shoes ever. It is unclear if she does dressing changes. She currently denies systemic signs of infection. 11/23; patient presents for follow-up. She wore her shoes today. It still unclear exactly what dressing she is using for each wound but she did states she obtained Dakin's solution and has been using this to the left foot wound. She currently denies signs of infection. GLORIAJEAN, OKUN ([id number]) 11/30; patient presents for follow-up. She has no issues or complaints today. She currently denies signs of infection. Patient History Information obtained from Patient. Social History Current every day smoker, Marital Status - Single, Alcohol Use - Never, Drug Use - Prior History, Caffeine Use - Daily. Medical History Ear/Nose/Mouth/Throat Patient has history of Chronic sinus  problems/congestion, Middle ear problems Hematologic/Lymphatic Patient has history of Anemia Respiratory Patient has history of Chronic Obstructive Pulmonary Disease (COPD) Cardiovascular Patient has history of Congestive Heart Failure - EF 33% Endocrine Patient has history of Type II Diabetes Genitourinary Patient has history of End Stage Renal Disease Integumentary (Skin) Patient has history of History of pressure wounds Neurologic Patient has history of Neuropathy - feel and legs Objective Constitutional Vitals Time Taken: 8:59 AM, Height: 69 in, Weight: 185 lbs, BMI: 27.3, Temperature: 97.8 F, Pulse: 109 bpm, Respiratory Rate: 16 breaths/min, Blood Pressure: 110/73 mmHg. General Notes: Left foot (Transmetatarsal amputation): Wound to the plantar aspect that has granulation tissue With scant nonviable tissue and increased pockets of depth. Right lower extremity: To the anterior aspect there is an open wound with granulation tissue. Right foot: To the heel there is an open wound with Granulation tissue and non viable tissue present. Circumferential callus. No obvious signs of infection to any wound. Integumentary (Hair, Skin) Wound #7 status is Open. Original cause of wound was Gradually Appeared. The date acquired was: 08/07/2020. The wound has been in treatment 26 weeks. The wound is located on the Cade. The wound measures 7.2cm length x 4.1cm width x 0.9cm depth; 23.185cm^2 area and 20.866cm^3 volume. There is Fat Layer (Subcutaneous Tissue) exposed. There is no tunneling or undermining noted. There is a medium amount of serosanguineous drainage noted. There is medium (34-66%) red, pink granulation within the wound bed. There is a medium (34-66%) amount of necrotic tissue within the wound bed including Adherent Slough. Wound #8 status is Open. Original cause of wound was Gradually Appeared. The date acquired was: 06/30/2021. The wound has been in treatment 12  weeks. The wound is located on the Right,Anterior Lower Leg. The wound measures 1.5cm length x 0.7cm width x 0.1cm depth; 0.825cm^2 area and 0.082cm^3 volume. There is no tunneling or undermining noted. There is a none present amount of drainage noted. There is no granulation within the wound bed. There is a large (67-100%) amount of necrotic tissue within the wound bed including Eschar. Wound #9 status is Open. Original cause of wound was Gradually Appeared. The date acquired was: 07/18/2021. The wound has been in treatment 9 weeks. The wound is located on the Right Calcaneus. The wound measures 0.3cm length x 0.3cm width x 0.5cm depth; 0.071cm^2 area and 0.035cm^3 volume. There is Fat Layer (Subcutaneous Tissue)  exposed. There is no tunneling noted, however, there is undermining starting at 12:00 and ending at 12:00 with a maximum distance of 0.5cm. There is a medium amount of serosanguineous drainage noted. There is small (1-33%) pink granulation within the wound bed. There is a large (67-100%) amount of necrotic tissue within the wound bed including [facility]. Assessment Active Problems ICD-10 Schoening, [name] 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 I recommended continuing with Dakin's wet-to-dry dressings to the left foot, Hydrofera Blue to the right heel and antibiotic ointment to the right anterior leg wound. No obvious signs of infection on exam. I debrided nonviable tissue. I recommended continuing aggressive offloading. Follow-up in 1 week Procedures Wound #9 Pre-procedure diagnosis of Wound #9 is a  Diabetic Wound/Ulcer of the Lower Extremity located on the Right Calcaneus .Severity of Tissue Pre Debridement is: Fat layer exposed. There was a Excisional Skin/Subcutaneous Tissue Debridement with a total area of 0.09 sq cm performed by Kalman Shan, MD. With the following instrument(s): Curette to remove Viable and Non-Viable tissue/material. Material removed includes Callus, Subcutaneous Tissue, Slough, Skin: Dermis, and Skin: Epidermis. No specimens were taken. A time out was conducted at 09:25, prior to the start of the procedure. A Minimum amount of bleeding was controlled with Pressure. The procedure was tolerated well with a pain level of 0 throughout and a pain level of 0 following the procedure. Post Debridement Measurements: 0.3cm length x 0.3cm width x 0.5cm depth; 0.035cm^3 volume. Character of Wound/Ulcer Post Debridement is improved. Severity of Tissue Post Debridement is: Fat layer exposed. Post procedure Diagnosis Wound #9: Same as Pre-Procedure Plan Follow-up Appointments: Return Appointment in 1 week. Bathing/ Shower/ Hygiene: Wash wounds with antibacterial soap and water. Off-Loading: Open toe surgical shoe with peg assist. Turn and reposition every 2 hours Other: - peg shoe right, off-loader left Medications-Please add to medication list.: P.O. Antibiotics WOUND #7: - Foot Wound Laterality: Plantar, Left, Midline Cleanser: Dakin 16 (oz) 0.25 1 x Per Day/30 Days Discharge Instructions: Use as directed. Primary Dressing: Gauze 1 x Per Day/30 Days Discharge Instructions: As directed: moistened with Dakins Solution Secured With: Kerlix Roll Sterile or Non-Sterile 6-ply 4.5x4 (yd/yd) 1 x Per Day/30 Days Discharge Instructions: Apply Kerlix as directed WOUND #8: - Lower Leg Wound Laterality: Right, Anterior Cleanser: Soap and Water 1 x Per Day/30 Days Discharge Instructions: Gently cleanse wound with antibacterial soap, rinse and pat dry prior to dressing  wounds Topical: Bacitracin Ointment, 1 (oz) tube 1 x Per Day/30 Days Secured With: Kerlix Roll Sterile or Non-Sterile 6-ply 4.5x4 (yd/yd) 1 x Per Day/30 Days Discharge Instructions: Apply Kerlix as directed WOUND #9: - Calcaneus Wound Laterality: Right Cleanser: Soap and Water 1 x Per Day/30 Days Discharge Instructions: Gently cleanse wound with antibacterial soap, rinse and pat dry prior to dressing wounds Primary Dressing: Hydrofera Blue Ready Transfer Foam, 2.5x2.5 (in/in) 1 x Per Day/30 Days Discharge Instructions: Apply Hydrofera Blue Ready to wound bed as directed Secondary Dressing: Bordered Gauze Sterile-HBD 4x4 (in/in) 1 x Per Day/30 Days Discharge Instructions: Cover wound with Bordered Guaze Sterile as directed Secured With: coverlet 1 x Per Day/30 Days 1. In office  sharp debridement 2. Dakin's, Hydrofera Blue, bacitracin 3. Aggressive offloading 4. Follow-up in 1 week [name], [name] ([id number]) Electronic Signature(s) Signed: 09/22/2021 9:49:55 AM By: Kalman Shan DO Entered By: Kalman Shan on 09/22/2021 09:49:01 [name] ([id number]) -------------------------------------------------------------------------------- ROS/PFSH Details Patient Name: Heather Dear A. Date of Service: 09/22/2021 9:00 AM Medical Record Number: [id number] Patient Account Number: [id number] Date of Birth/Sex: Mar 12, 1976 (45 y.o. F) Treating RN: Carlene Coria Primary Care Provider: Raelene Bott Other Clinician: Referring Provider: Raelene Bott Treating Provider/Extender: Yaakov Guthrie in Treatment: 26 Information Obtained From Patient Ear/Nose/Mouth/Throat Medical History: Positive for: Chronic sinus problems/congestion; Middle ear problems Hematologic/Lymphatic Medical History: Positive for: Anemia Respiratory Medical History: Positive for: Chronic Obstructive Pulmonary Disease (COPD) Cardiovascular Medical History: Positive for: Congestive Heart Failure  - EF 33% Endocrine Medical History: Positive for: Type II Diabetes Time with diabetes: 14 years Treated with: Insulin, Oral agents Blood sugar tested every day: No Blood sugar testing results: Bedtime: 176 Genitourinary Medical History: Positive for: End Stage Renal Disease Integumentary (Skin) Medical History: Positive for: History of pressure wounds Neurologic Medical History: Positive for: Neuropathy - feel and legs HBO Extended History Items Ear/Nose/Mouth/Throat: Ear/Nose/Mouth/Throat: Chronic sinus problems/congestion Middle ear problems Immunizations Pneumococcal Vaccine: Received Pneumococcal Vaccination: No Duchene, Renesmay A. ([id number]) Implantable Devices No devices added Family and Social History Current every day smoker; Marital Status - Single; Alcohol Use: Never; Drug Use: Prior History; Caffeine Use: Daily Electronic Signature(s) Signed: 09/22/2021 9:49:55 AM By: Kalman Shan DO Signed: 09/22/2021 2:51:09 PM By: Carlene Coria RN Entered By: Kalman Shan on 09/22/2021 09:46:21 Gignac, Arielis AMarland Kitchen ([id number]) -------------------------------------------------------------------------------- SuperBill Details Patient Name: Heather Dear A. Date of Service: 09/22/2021 Medical Record Number: [id number] Patient Account Number: [id number] Date of Birth/Sex: 1976-07-03 (45 y.o. F) Treating RN: Carlene Coria Primary Care Provider: Raelene Bott Other Clinician: Referring Provider: Raelene Bott Treating Provider/Extender: Yaakov Guthrie in Treatment: 26 Diagnosis Coding ICD-10 Codes Code Description [phone] 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ity L97.512 Non-pressure chronic ulcer of other part of right foot with fat layer exposed L97.812 Non-pressure chronic ulcer of other part of right lower leg with fat layer exposed E11.621 Type 2 diabetes mellitus with foot ulcer E11.42 Type 2 diabetes mellitus with  diabetic polyneuropathy Facility Procedures CPT4 Code: 28638177 Description: 11657 - DEB SUBQ TISSUE 20 SQ CM/< Modifier: Quantity: 1 CPT4 Code: Description: ICD-10 Diagnosis Description L97.512 Non-pressure chronic ulcer of other part of right foot with fat layer ex Modifier: posed Quantity: Physician Procedures CPT4 Code: 9038333 Description: 11042 - WC PHYS SUBQ TISS 20 SQ CM Modifier: Quantity: 1 CPT4 Code: Description: ICD-10 Diagnosis Description L97.512 Non-pressure chronic ulcer of other part of right foot with fat layer ex Modifier: posed Quantity: Electronic Signature(s) Signed: 09/22/2021 9:49:55 AM By: Kalman Shan DO Entered By: Kalman Shan on 09/22/2021 09:49:29

## 2021-09-22 NOTE — Progress Notes (Signed)
BRYAH, OCHELTREE ([id number]) Visit Report for 09/22/2021 Arrival Information Details Patient Name: [name], [name] Date of Service: 09/22/2021 9:00 AM Medical Record Number: [id number] Patient Account Number: [id number] Date of Birth/Sex: [birth year] (45 y.o. F) Treating RN: Carlene Coria Primary Care Authur Cubit: Raelene Bott Other Clinician: Referring Ryden Wainer: Raelene Bott Treating Marva Hendryx/Extender: Yaakov Guthrie in Treatment: 26 Visit Information History Since Last Visit All ordered tests and consults were completed: No Patient Arrived: Wheel Chair Added or deleted any medications: No Arrival Time: 08:54 Any new allergies or adverse reactions: No Accompanied By: mother Had [name] fall or experienced change in No Transfer Assistance: None activities of daily living that may affect Patient Identification Verified: Yes risk of falls: Secondary Verification Process Completed: Yes Signs or symptoms of abuse/neglect since last visito No Patient Requires Transmission-Based No Hospitalized since last visit: No Precautions: Implantable device outside of the clinic excluding No Patient Has Alerts: Yes cellular tissue based products placed in the center Patient Alerts: Patient on Blood since last visit: Thinner Has Dressing in Place as Prescribed: Yes 36m apirin Pain Present Now: Yes Diabetic Type II Electronic Signature(s) Signed: 09/22/2021 2:51:09 PM By: ECarlene CoriaRN Entered By: ECarlene Coriaon 09/22/2021 08:59:32 [place], [name] Kitchen([id number] -------------------------------------------------------------------------------- Clinic Level of Care Assessment Details Patient Name: [name] Date of Service: 09/22/2021 9:00 AM Medical Record Number: [id number] Account Number: [id number] of Birth/Sex: 908-Oct-1977(45y.o. F) Treating RN: ECarlene CoriaPrimary Care Momin Misko: HRaelene BottOther Clinician: Referring Ashleigh Arya: HRaelene BottTreating  Irish Piech/Extender: HYaakov Guthriein Treatment: 26 Clinic Level of Care Assessment Items TOOL 1 Quantity Score _0  - Use when EandM and Procedure is performed on INITIAL visit 0 ASSESSMENTS - Nursing Assessment / Reassessment _1  - General Physical Exam (combine w/ comprehensive assessment (listed just below) when performed on new 0 pt. evals) _2  - 0 Comprehensive Assessment (HX, ROS, Risk Assessments, Wounds Hx, etc.) ASSESSMENTS - Wound and Skin Assessment / Reassessment _3  - Dermatologic / Skin Assessment (not related to wound area) 0 ASSESSMENTS - Ostomy and/or Continence Assessment and Care _4  - Incontinence Assessment and Management 0 _5  - 0 Ostomy Care Assessment and Management (repouching, etc.) PROCESS - Coordination of Care _6  - Simple Patient / Family Education for ongoing care 0 _7  - 0 Complex (extensive) Patient / Family Education for ongoing care _8  - 0 Staff obtains CProgrammer, systems Records, Test Results / Process Orders _9  - 0 Staff telephones HHA, Nursing Homes / Clarify orders / etc _10  - 0 Routine Transfer to another Facility (non-emergent condition) _11  - 0 Routine Hospital Admission (non-emergent condition) _12  - 0 New Admissions / IBiomedical engineer/ Ordering NPWT, Apligraf, etc. _13  - 0 Emergency Hospital Admission (emergent condition) PROCESS - Special Needs _14  - Pediatric / Minor Patient Management 0 _15  - 0 Isolation Patient Management _16  - 0 Hearing / Language / Visual special needs _17  - 0 Assessment of Community assistance (transportation, D/C planning, etc.) _18  - 0 Additional assistance / Altered mentation _19  - 0 Support Surface(s) Assessment (bed, cushion, seat, etc.) INTERVENTIONS - Miscellaneous _20  - External ear exam 0 _21  - 0 Patient Transfer (multiple staff / HCivil Service fast streamer/ Similar devices) _22  - 0 Simple Staple / Suture removal (25 or less) _23  - 0 Complex Staple / Suture removal (26 or more) _24  - 0 Hypo/Hyperglycemic Management  (do not check if billed separately) _25  - 0 Ankle / Brachial Index (ABI) - do not check if billed separately Has the patient been seen at  the hospital within the last three years: Yes Total Score: 0 Level Of Care: ____ [name] ([id number]) Electronic Signature(s) Signed: 09/22/2021 2:51:09 PM By: Carlene Coria RN Entered By: Carlene Coria on 09/22/2021 09:29:36 [name] ([id number]) -------------------------------------------------------------------------------- Encounter Discharge Information Details Patient Name: Heather Dear [name]. Date of Service: 09/22/2021 9:00 AM Medical Record Number: [id number] Patient Account Number: [id number] Date of Birth/Sex: 1976/06/23 (45 y.o. F) Treating RN: Carlene Coria Primary Care Kacee Sukhu: Raelene Bott Other Clinician: Referring Tredarius Cobern: Raelene Bott Treating Jacora Hopkins/Extender: Yaakov Guthrie in Treatment: 75 Encounter Discharge Information Items Post Procedure Vitals Discharge Condition: Stable Temperature (F): 97.8 Ambulatory Status: Wheelchair Pulse (bpm): 109 Discharge Destination: Home Respiratory Rate (breaths/min): 16 Transportation: Private Auto Blood Pressure (mmHg): 110/73 Accompanied By: mother Schedule Follow-up Appointment: Yes Clinical Summary of Care: Patient Declined Electronic Signature(s) Signed: 09/22/2021 2:51:09 PM By: Carlene Coria RN Entered By: Carlene Coria on 09/22/2021 09:40:21 Dorough, Deasha [name]. ([id number]) -------------------------------------------------------------------------------- Lower Extremity Assessment Details Patient Name: Heather Dear [name]. Date of Service: 09/22/2021 9:00 AM Medical Record Number: [id number] Patient Account Number: [id number] Date of Birth/Sex: Jun 20, 1976 (45 y.o. F) Treating RN: Carlene Coria Primary Care Leen Tworek: Raelene Bott Other Clinician: Referring Deng Kemler: Raelene Bott Treating Hampton Cost/Extender: Yaakov Guthrie in Treatment:  26 Vascular Assessment Pulses: Dorsalis Pedis Palpable: [Left:Yes] [Right:Yes] Electronic Signature(s) Signed: 09/22/2021 2:51:09 PM By: Carlene Coria RN Entered By: Carlene Coria on 09/22/2021 09:14:18 [name], [name] Kitchen ([id number]) -------------------------------------------------------------------------------- Multi Wound Chart Details Patient Name: Heather Dear [name]. Date of Service: 09/22/2021 9:00 AM Medical Record Number: [id number] Patient Account Number: [id number] Date of Birth/Sex: May 20, 1976 (45 y.o. 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 Extremity Extremity Comorbid History: Chronic sinus problems/congestion, Chronic sinus problems/congestion, Chronic sinus problems/congestion, Middle ear problems, Anemia, Middle ear problems, Anemia, Middle ear problems, Anemia, Chronic Obstructive Pulmonary Chronic Obstructive Pulmonary Chronic Obstructive Pulmonary Disease (COPD), Congestive Heart Disease (COPD), Congestive Heart Disease (COPD), Congestive Heart Failure, Type II Diabetes, End Stage Failure, Type II Diabetes, End Stage Failure, Type II Diabetes, End Stage Renal Disease, History of pressure Renal Disease, History of pressure Renal Disease, History of pressure wounds, Neuropathy wounds, Neuropathy wounds, Neuropathy Date Acquired: 08/07/2020 06/30/2021  07/18/2021 Weeks of Treatment: _0 Wound Status: Open Open Open Measurements L x W x D (cm) 7.2x4.1x0.9 1.5x0.7x0.1 0.3x0.3x0.5 Area (cm) : 23.185 0.825 0.071 Volume (cm) : 20.866 0.082 0.035 % Reduction in Area: -768.40% 86.90% 78.50% % Reduction in Volume: -1463.00% 86.90% 84.80% Starting Position 1 (o'clock): 12 Ending Position 1 (o'clock): 12 Maximum Distance 1 (cm): 0.5 Undermining: No No Yes Classification: Grade 2 Grade 1 Grade 1 Exudate Amount: Medium None Present Medium Exudate Type: Serosanguineous N/[name] Serosanguineous Exudate Color: red, brown N/[name] red, brown Granulation Amount: Medium (34-66%) None Present (0%) Small (1-33%) Granulation Quality: Red, Pink N/[name] Pink Necrotic Amount: Medium (34-66%) Large (67-100%) Large (67-100%) Necrotic Tissue: Adherent [place] Exposed Structures: Fat Layer (Subcutaneous Tissue): Fascia: No Fat Layer (Subcutaneous Tissue): Yes Fat Layer (Subcutaneous Tissue): Yes Fascia: No No Fascia: No Tendon: No Tendon: No Tendon: No Muscle:  No Muscle: No Muscle: No Joint: No Joint: No Joint: No Bone: No Bone: No Bone: No Epithelialization: None None None Debridement: N/[name] N/[name] Debridement - Excisional Pre-procedure Verification/Time N/[name] N/[name] 09:25 Out Taken: Tissue Debrided: N/[name] N/[name] Callus, Subcutaneous, Slough [name], [name] ([id number]) Level: N/[name] N/[name] Skin/Subcutaneous Tissue Debridement Area (sq cm): N/[name] N/[name] 0.09 Instrument: N/[name] N/[name] Curette Bleeding: N/[name] N/[name] Minimum Hemostasis Achieved: N/[name] N/[name] Pressure Procedural Pain: N/[name] N/[name] 0 Post Procedural Pain: N/[name] N/[name] 0 Debridement Treatment N/[name] N/[name] Procedure was tolerated well Response: Post Debridement N/[name] N/[name] 0.3x0.3x0.5 Measurements L x W x D (cm) Post Debridement Volume: N/[name] N/[name] 0.035 (cm) Procedures Performed: N/[name] N/[name] Debridement Treatment Notes Wound #7 (Foot) Wound Laterality: Plantar, Left, Midline Cleanser Dakin 16 (oz) 0.25 Discharge  Instruction: Use as directed. Peri-Wound Care Topical Primary Dressing Gauze Discharge Instruction: As directed: moistened with Dakins Solution Secondary Dressing Secured With Kerlix Roll Sterile or Non-Sterile 6-ply 4.5x4 (yd/yd) Discharge Instruction: Apply Kerlix as directed Compression Wrap Compression Stockings Add-Ons Wound #8 (Lower Leg) Wound Laterality: Right, Anterior Cleanser Soap and Water Discharge Instruction: Gently cleanse wound with antibacterial soap, rinse and pat dry prior to dressing wounds Peri-Wound Care Topical Bacitracin Ointment, 1 (oz) tube Primary Dressing Secondary Dressing Secured With Kerlix Roll Sterile or Non-Sterile 6-ply 4.5x4 (yd/yd) Discharge Instruction: Apply Kerlix as directed Compression Wrap Compression Stockings Add-Ons Wound #9 (Calcaneus) Wound Laterality: Right [name], [name] ([id number]) Cleanser Soap and Water Discharge Instruction: Gently cleanse wound with antibacterial soap, rinse and pat dry prior to dressing wounds Peri-Wound Care Topical Primary Dressing Hydrofera Blue Ready Transfer Foam, 2.5x2.5 (in/in) Discharge Instruction: Apply Hydrofera Blue Ready to wound bed as directed Secondary Dressing Bordered Gauze Sterile-HBD 4x4 (in/in) Discharge Instruction: Cover wound with Bordered Guaze Sterile as directed Secured With coverlet Compression Wrap Compression Stockings Add-Ons Electronic Signature(s) Signed: 09/22/2021 9:49:55 AM By: Kalman Shan DO Entered By: Kalman Shan on 09/22/2021 09:45:10 [name] ([id number]) -------------------------------------------------------------------------------- [place] Details Patient Name: Heather Dear [name]. Date of Service: 09/22/2021 9:00 AM Medical Record Number: [id number] Patient Account Number: [id number] Date of Birth/Sex: 03-May-1976 (45 y.o. F) Treating RN: Carlene Coria Primary Care Susen Haskew: Raelene Bott Other  Clinician: Referring Zulma Court: Raelene Bott Treating Fran Mcree/Extender: Yaakov Guthrie in Treatment: 26 Active Inactive Necrotic Tissue Nursing Diagnoses: Impaired tissue integrity related to necrotic/devitalized tissue Knowledge deficit related to management of necrotic/devitalized tissue Goals: Necrotic/devitalized tissue will be minimized in the wound bed Date Initiated: 04/07/2021 Target Resolution Date: 10/08/2021 Goal Status: Active Patient/caregiver will verbalize understanding of reason and process for debridement of necrotic tissue Date Initiated: 04/07/2021 Date Inactivated: 09/22/2021 Target Resolution Date: 09/08/2021 Goal Status: Met Interventions: Assess patient pain level pre-, during and post procedure and prior to discharge Provide education on necrotic tissue and debridement process Treatment Activities: Excisional debridement : 04/07/2021 Notes: Electronic Signature(s) Signed: 09/22/2021 2:51:09 PM By: Carlene Coria RN Entered By: Carlene Coria on 09/22/2021 09:21:35 Pasternak, Chloie [name]. ([id number]) -------------------------------------------------------------------------------- Pain Assessment Details Patient Name: Heather Dear [name]. Date of Service: 09/22/2021 9:00 AM Medical Record Number: [id number] Patient Account Number: [id number] Date of Birth/Sex: [birth year] (45 y.o. F) Treating RN: Carlene Coria Primary Care Travia Onstad: Raelene Bott Other Clinician: Referring Izack Hoogland: Raelene Bott Treating Tysin Salada/Extender: Yaakov Guthrie in Treatment: 26 Active Problems Location of Pain Severity and Description of Pain Patient Has Paino Yes Site Locations With Dressing Change: Yes Duration of the Pain. Constant / Intermittento Constant Rate the pain. Current Pain Level: 7 Worst Pain Level: 10 Least Pain Level: 5  Tolerable Pain Level: 5 Character of Pain Describe the Pain: Aching, Burning, Throbbing Pain Management and Medication Current  Pain Management: Medication: No Cold Application: No Rest: Yes Massage: No Activity: No T.E.N.S.: No Heat Application: No Leg drop or elevation: No Is the Current Pain Management Adequate: Inadequate How does your wound impact your activities of daily livingo Sleep: Yes Bathing: No Appetite: No Relationship With Others: No Bladder Continence: No Emotions: No Bowel Continence: No Work: No Toileting: No Drive: No Dressing: No Hobbies: No Electronic Signature(s) Signed: 09/22/2021 2:51:09 PM By: Carlene Coria RN Entered By: Carlene Coria on 09/22/2021 09:00:46 [name] ([id number]) -------------------------------------------------------------------------------- Patient/Caregiver Education Details Patient Name: Heather Dear [name]. Date of Service: 09/22/2021 9:00 AM Medical Record Number: [id number] Patient Account Number: [id number] Date of Birth/Gender: Feb 27, 1976 (45 y.o. F) Treating RN: Carlene Coria Primary Care Physician: Raelene Bott Other Clinician: Referring Physician: Raelene Bott Treating Physician/Extender: Yaakov Guthrie in Treatment: 85 Education Assessment Education Provided To: Patient Education Topics Provided Wound Debridement: Methods: Explain/Verbal Responses: State content correctly Electronic Signature(s) Signed: 09/22/2021 2:51:09 PM By: Carlene Coria RN Entered By: Carlene Coria on 09/22/2021 09:30:03 [name] ([id number]) -------------------------------------------------------------------------------- Wound Assessment Details Patient Name: Heather Dear [name]. Date of Service: 09/22/2021 9:00 AM Medical Record Number: [id number] Patient Account Number: [id number] Date of Birth/Sex: 09-04-1976 (45 y.o. F) Treating RN: Carlene Coria Primary Care Cherry Turlington: Raelene Bott Other Clinician: Referring Maryuri Warnke: Raelene Bott Treating Shyia Fillingim/Extender: Yaakov Guthrie in Treatment: 26 Wound Status Wound Number: 7 Primary  Diabetic Wound/Ulcer of the Lower Extremity Etiology: Wound Location: Left, Midline, Plantar Foot Wound Open Wounding Event: Gradually Appeared Status: Date Acquired: 08/07/2020 Comorbid Chronic sinus problems/congestion, Middle ear problems, Weeks Of Treatment: 26 History: Anemia, Chronic Obstructive Pulmonary Disease (COPD), Clustered Wound: No Congestive Heart Failure, Type II Diabetes, End Stage Renal Disease, History of pressure wounds, Neuropathy Photos Wound Measurements Length: (cm) 7.2 % Red Width: (cm) 4.1 % Red Depth: (cm) 0.9 Epith Area: (cm) 23.185 Tunn Volume: (cm) 20.866 Unde uction in Area: -768.4% uction in Volume: -1463% elialization: None eling: No rmining: No Wound Description Classification: Grade 2 Foul Exudate Amount: Medium Sloug Exudate Type: Serosanguineous Exudate Color: red, brown Odor After Cleansing: No h/Fibrino Yes Wound Bed Granulation Amount: Medium (34-66%) Exposed Structure Granulation Quality: Red, Pink Fascia Exposed: No Necrotic Amount: Medium (34-66%) Fat Layer (Subcutaneous Tissue) Exposed: Yes Necrotic Quality: Adherent Slough Tendon Exposed: No Muscle Exposed: No Joint Exposed: No Bone Exposed: No Treatment Notes Wound #7 (Foot) Wound Laterality: Plantar, Left, Midline Cleanser Dakin 16 (oz) 0.25 Discharge Instruction: Use as directed. Peri-Wound Care ETOLA, MULL [name]. ([id number]) Topical Primary Dressing Gauze Discharge Instruction: As directed: moistened with Dakins Solution Secondary Dressing Secured With Kerlix Roll Sterile or Non-Sterile 6-ply 4.5x4 (yd/yd) Discharge Instruction: Apply Kerlix as directed Compression Wrap Compression Stockings Add-Ons Electronic Signature(s) Signed: 09/22/2021 2:51:09 PM By: Carlene Coria RN Entered By: Carlene Coria on 09/22/2021 09:11:12 [name], [name] ([id number]) -------------------------------------------------------------------------------- Wound Assessment  Details Patient Name: Heather Dear [name]. Date of Service: 09/22/2021 9:00 AM Medical Record Number: [id number] Patient Account Number: [id number] Date of Birth/Sex: 31-Dec-1975 (45 y.o. F) Treating RN: Carlene Coria Primary Care Saquoia Sianez: Raelene Bott Other Clinician: Referring Tarvares Lant: Raelene Bott Treating Gean Larose/Extender: Yaakov Guthrie in Treatment: 26 Wound Status Wound Number: 8 Primary Diabetic Wound/Ulcer of the Lower Extremity Etiology: Wound Location: Right, Anterior Lower Leg Wound Open Wounding Event: Gradually Appeared Status: Date Acquired: 06/30/2021 Comorbid Chronic sinus problems/congestion, Middle ear problems, Weeks Of Treatment: 12 History: Anemia, Chronic  Obstructive Pulmonary Disease (COPD), Clustered Wound: No Congestive Heart Failure, Type II Diabetes, End Stage Renal Disease, History of pressure wounds, Neuropathy Photos Wound Measurements Length: (cm) 1.5 Width: (cm) 0.7 Depth: (cm) 0.1 Area: (cm) 0.825 Volume: (cm) 0.082 % Reduction in Area: 86.9% % Reduction in Volume: 86.9% Epithelialization: None Tunneling: No Undermining: No Wound Description Classification: Grade 1 Exudate Amount: None Present Foul Odor After Cleansing: No Slough/Fibrino Yes Wound Bed Granulation Amount: None Present (0%) Exposed Structure Necrotic Amount: Large (67-100%) Fascia Exposed: No Necrotic Quality: Eschar Fat Layer (Subcutaneous Tissue) Exposed: No Tendon Exposed: No Muscle Exposed: No Joint Exposed: No Bone Exposed: No Treatment Notes Wound #8 (Lower Leg) Wound Laterality: Right, Anterior Cleanser Soap and Water Discharge Instruction: Gently cleanse wound with antibacterial soap, rinse and pat dry prior to dressing wounds Peri-Wound Care [name], [name] ([id number]) Topical Bacitracin Ointment, 1 (oz) tube Primary Dressing Secondary Dressing Secured With Kerlix Roll Sterile or Non-Sterile 6-ply 4.5x4 (yd/yd) Discharge Instruction:  Apply Kerlix as directed Compression Wrap Compression Stockings Add-Ons Electronic Signature(s) Signed: 09/22/2021 2:51:09 PM By: Carlene Coria RN Entered By: Carlene Coria on 09/22/2021 09:11:53 [name], [name] ([id number]) -------------------------------------------------------------------------------- Wound Assessment Details Patient Name: Heather Dear [name]. Date of Service: 09/22/2021 9:00 AM Medical Record Number: [id number] Patient Account Number: [id number] Date of Birth/Sex: 1976/02/22 (45 y.o. F) Treating RN: Carlene Coria Primary Care Kunio Cummiskey: Raelene Bott Other Clinician: Referring Reatha Sur: Raelene Bott Treating Dannie Woolen/Extender: Yaakov Guthrie in Treatment: 26 Wound Status Wound Number: 9 Primary Diabetic Wound/Ulcer of the Lower Extremity Etiology: Wound Location: Right Calcaneus Wound Open Wounding Event: Gradually Appeared Status: Date Acquired: 07/18/2021 Comorbid Chronic sinus problems/congestion, Middle ear problems, Weeks Of Treatment: 9 History: Anemia, Chronic Obstructive Pulmonary Disease (COPD), Clustered Wound: No Congestive Heart Failure, Type II Diabetes, End Stage Renal Disease, History of pressure wounds, Neuropathy Photos Wound Measurements Length: (cm) 0.3 % Reduc Width: (cm) 0.3 % Reduc Depth: (cm) 0.5 Epithel Area: (cm) 0.071 Tunnel Volume: (cm) 0.035 Underm Star Endi Maxi tion in Area: 78.5% tion in Volume: 84.8% ialization: None ing: No ining: Yes ting Position (o'clock): 12 ng Position (o'clock): 12 mum Distance: (cm) 0.5 Wound Description Classification: Grade 1 Foul Od Exudate Amount: Medium Slough/ Exudate Type: Serosanguineous Exudate Color: red, brown or After Cleansing: No Fibrino Yes Wound Bed Granulation Amount: Small (1-33%) Exposed Structure Granulation Quality: Pink Fascia Exposed: No Necrotic Amount: Large (67-100%) Fat Layer (Subcutaneous Tissue) Exposed: Yes Necrotic Quality: Adherent  Slough Tendon Exposed: No Muscle Exposed: No Joint Exposed: No Bone Exposed: No Treatment Notes Wound #9 (Calcaneus) Wound Laterality: Right Cleanser [name], [name] ([id number]) Soap and Water Discharge Instruction: Gently cleanse wound with antibacterial soap, rinse and pat dry prior to dressing wounds Peri-Wound Care Topical Primary Dressing Hydrofera Blue Ready Transfer Foam, 2.5x2.5 (in/in) Discharge Instruction: Apply Hydrofera Blue Ready to wound bed as directed Secondary Dressing Bordered Gauze Sterile-HBD 4x4 (in/in) Discharge Instruction: Cover wound with Bordered Guaze Sterile as directed Secured With coverlet Compression Wrap Compression Stockings Add-Ons Electronic Signature(s) Signed: 09/22/2021 2:51:09 PM By: Carlene Coria RN Entered By: Carlene Coria on 09/22/2021 09:13:36 [name], [name] ([id number]) -------------------------------------------------------------------------------- Vitals Details Patient Name: Heather Dear [name]. Date of Service: 09/22/2021 9:00 AM Medical Record Number: [id number] Patient Account Number: [id number] Date of Birth/Sex: [birth year] (45 y.o. F) Treating RN: Carlene Coria Primary Care Gary Bultman: Raelene Bott Other Clinician: Referring Kenyia Wambolt: Raelene Bott Treating Jae Skeet/Extender: Yaakov Guthrie in Treatment: 26 Vital Signs Time Taken: 08:59 Temperature (F): 97.8 Height (in): 69 Pulse (bpm): 109  Weight (lbs): 185 Respiratory Rate (breaths/min): 16 Body Mass Index (BMI): 27.3 Blood Pressure (mmHg): 110/73 Reference Range: 80 - 120 mg / dl Electronic Signature(s) Signed: 09/22/2021 2:51:09 PM By: Carlene Coria RN Entered By: Carlene Coria on 09/22/2021 08:59:51

## 2021-09-29 ENCOUNTER — Encounter: Payer: Medicaid Other | Attending: Internal Medicine | Admitting: Internal Medicine

## 2021-09-29 ENCOUNTER — Other Ambulatory Visit: Payer: Self-pay

## 2021-09-29 DIAGNOSIS — E11621 Type 2 diabetes mellitus with foot ulcer: Secondary | ICD-10-CM | POA: Insufficient documentation

## 2021-09-29 DIAGNOSIS — L97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ity: Secondary | ICD-10-CM | POA: Diagnosis not present

## 2021-09-29 DIAGNOSIS — E11622 Type 2 diabetes mellitus with other skin ulcer: Secondary | ICD-10-CM | POA: Insufficient documentation

## 2021-09-29 DIAGNOSIS — L97512 Non-pressure chronic ulcer of other part of right foot with fat layer exposed: Secondary | ICD-10-CM | POA: Insufficient documentation

## 2021-09-29 DIAGNOSIS — F17210 Nicotine dependence, cigarettes, uncomplicated: Secondary | ICD-10-CM | POA: Diagnosis not present

## 2021-09-29 DIAGNOSIS — E1142 Type 2 diabetes mellitus with diabetic polyneuropathy: Secondary | ICD-10-CM | POA: Insufficient documentation

## 2021-09-29 DIAGNOSIS — L97812 Non-pressure chronic ulcer of other part of right lower leg with fat layer exposed: Secondary | ICD-10-CM | POA: Diagnosis not present

## 2021-09-29 NOTE — Progress Notes (Signed)
[name], [name] ([id number]) Visit Report for 09/29/2021 Arrival Information Details Patient Name: Heather, HALBERG [name]. Date of Service: 09/29/2021 9:45 AM Medical Record Number: [id number] Patient Account Number: [id number] Date of Birth/Sex: Jun 13, 1976 (45 y.o. F) Treating RN: [name] Primary Care [name]: [name] Other Clinician: Cornell [name] Referring [name]: [name] Treating [name]/Extender: [name] in Treatment: 27 Visit Information History Since Last Visit Added or deleted any medications: No Patient Arrived: Wheel Chair Any new allergies or adverse reactions: No Arrival Time: 09:58 Had [name] fall or experienced change in No Accompanied By: mother activities of daily living that may affect Transfer Assistance: None risk of falls: Patient Identification Verified: Yes Signs or symptoms of abuse/neglect since last visito No Secondary Verification Process Completed: Yes Hospitalized since last visit: No Patient Requires Transmission-Based No Implantable device outside of the clinic excluding No Precautions: cellular tissue based products placed in the center Patient Has Alerts: Yes since last visit: Patient Alerts: Patient on Blood Has Dressing in Place as Prescribed: No Thinner Pain Present Now: Yes 84m apirin Diabetic Type II Electronic Signature(s) Signed: 09/29/2021 5:07:29 PM By: WGretta Cool BSN, RN, CWS, [name] RN, BSN Entered By: WGretta Cool BSN, RN, CWS, [name] on 09/29/2021 10:05:39 [name]([id number] -------------------------------------------------------------------------------- Encounter Discharge Information Details Patient Name: Heather DearA. Date of Service: 09/29/2021 9:45 AM Medical Record Number: [id number] Account Number: [id number] of Birth/Sex: [birth year](45y.o. F) Treating RN: WCornell BarmanPrimary Care Casara Perrier: HRaelene BottOther Clinician: WCornell BarmanReferring Jennica Tagliaferri: HRaelene BottTreating  Iley Breeden/Extender: HYaakov Guthriein Treatment: 27 Encounter Discharge Information Items Post Procedure Vitals Discharge Condition: Stable Temperature (F): 98.5 Ambulatory Status: Walker Pulse (bpm): 108 Discharge Destination: Home Respiratory Rate (breaths/min): 18 Transportation: Private Auto Blood Pressure (mmHg): 129/78 Accompanied By: mother Schedule Follow-up Appointment: Yes Clinical Summary of Care: Electronic Signature(s) Signed: 09/29/2021 5:07:29 PM By: WGretta Cool BSN, RN, CWS, [name] RN, BSN Entered By: WGretta Cool BSN, RN, CWS, [name] on 09/29/2021 10:35:43 [name]([id number] -------------------------------------------------------------------------------- Lower Extremity Assessment Details Patient Name: Heather DearA. Date of Service: 09/29/2021 9:45 AM Medical Record Number: [id number] Account Number: [id number] of Birth/Sex: 9March 09, 1977(45y.o. F) Treating RN: WCornell BarmanPrimary Care Fionnuala Hemmerich: HRaelene BottOther Clinician: WCornell BarmanReferring Reyli Schroth: HRaelene BottTreating Lourdez Mcgahan/Extender: HYaakov Guthriein Treatment: 27 Edema Assessment Assessed: [Left: No] [Right: No] Edema: [Left: Yes] [Right: No] Vascular Assessment Pulses: Dorsalis Pedis Palpable: [Left:Yes] [Right:Yes] Electronic Signature(s) Signed: 09/29/2021 5:07:29 PM By: WGretta Cool BSN, RN, CWS, [name] RN, BSN Entered By: WGretta Cool BSN, RN, CWS, [name] on 09/29/2021 10:15:01 [name]([id number] -------------------------------------------------------------------------------- Multi Wound Chart Details Patient Name: Heather DearA. Date of Service: 09/29/2021 9:45 AM Medical Record Number: [id number] Account Number: [id number] of Birth/Sex: [birth year](45y.o. 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 Extremity Extremity Comorbid History: Chronic sinus problems/congestion, Chronic sinus problems/congestion, Chronic sinus problems/congestion, Middle ear problems, Anemia, Middle ear problems, Anemia, Middle ear problems, Anemia, Chronic Obstructive Pulmonary Chronic Obstructive Pulmonary Chronic Obstructive Pulmonary Disease (COPD), Congestive Heart  Disease (COPD), Congestive Heart Disease (COPD), Congestive Heart Failure, Type II Diabetes, End Stage Failure, Type II Diabetes, End Stage Failure, Type II Diabetes, End Stage Renal Disease, History of pressure Renal Disease, History of pressure Renal Disease, History of pressure wounds, Neuropathy wounds, Neuropathy wounds, Neuropathy Date Acquired: 08/07/2020 06/30/2021 07/18/2021 Weeks of Treatment: 27 13 10  Wound Status: Open Open Open Measurements L x W x D (cm) 4.5x3.2x3.5 0x0x0 0.7x0.9x0.3 Area (cm) : 11.31 0 0.495 Volume (cm) : 39.584 0 0.148 % Reduction in Area: -323.60% 100.00% -50.00% % Reduction in Volume: -2865.10% 100.00% 35.90% Classification: Grade 2 Grade 1 Grade 1 Exudate Amount: Medium None Present Medium Exudate Type: Serosanguineous N/[name] Serosanguineous Exudate Color: red, brown N/[name] red, brown Wound Margin: Thickened N/[name] N/[name] Granulation Amount: Large (67-100%) None Present (0%) Small (1-33%) Granulation Quality: Pink, Hyper-granulation N/[name] Pink Necrotic Amount: Small (1-33%) None Present (0%) Large (67-100%) Exposed Structures: Fat Layer (Subcutaneous  Tissue): Fascia: No Fat Layer (Subcutaneous Tissue): Yes Fat Layer (Subcutaneous Tissue): Yes Fascia: No No Fascia: No Tendon: No Tendon: No Tendon: No Muscle: No Muscle: No Muscle: No Joint: No Joint: No Joint: No Bone: No Bone: No Bone: No Epithelialization: None Large (67-100%) None Assessment Notes: callus surrounding wound N/[name] N/[name] Treatment Notes Electronic Signature(s) [name], [name] ([id number]) Signed: 09/29/2021 5:07:29 PM By: Gretta Cool, BSN, RN, CWS, [name] RN, BSN Entered By: Gretta Cool, BSN, RN, CWS, [name] on 09/29/2021 10:21:01 [name] ([id number]) -------------------------------------------------------------------------------- Multi-Disciplinary Care Plan Details Patient Name: Heather Dear [name]. Date of Service: 09/29/2021 9:45 AM Medical Record Number: [id number] Patient Account Number: [id number] Date of Birth/Sex: 1976/09/25 (45 y.o. F) Treating RN: [name] Primary Care Sakiya Stepka: [name] Other Clinician: Cornell [name] Referring Pradyun Ishman: [name] Treating Akshaj Besancon/Extender: [name] in Treatment: 27 Active Inactive Necrotic Tissue Nursing Diagnoses: Impaired tissue integrity related to necrotic/devitalized tissue Knowledge deficit related to management of necrotic/devitalized tissue Goals: Necrotic/devitalized tissue will be minimized in the wound bed Date Initiated: 04/07/2021 Target Resolution Date: 10/08/2021 Goal Status: Active Patient/caregiver will verbalize understanding of reason and process for debridement of necrotic tissue Date Initiated: 04/07/2021 Date Inactivated: 09/22/2021 Target Resolution Date: 09/08/2021 Goal Status: Met Interventions: Assess patient pain level pre-, during and post procedure and prior to discharge Provide education on necrotic tissue and debridement process Treatment Activities: Excisional debridement : 04/07/2021 Notes: Electronic Signature(s) Signed: 09/29/2021 5:07:29 PM By: Gretta Cool, BSN,  RN, CWS, [name] RN, BSN Entered By: Gretta Cool, BSN, RN, CWS, [name] on 09/29/2021 10:20:42 [name] ([id number]) -------------------------------------------------------------------------------- Pain Assessment Details Patient Name: Heather Dear [name]. Date of Service: 09/29/2021 9:45 AM Medical Record Number: [id number] Patient Account Number: [id number] Date of Birth/Sex: 02-07-1976 (45 y.o. F) Treating RN: [name] Primary Care Azarion Hove: [name] Other Clinician: Cornell [name] Referring Joei Frangos: [name] Treating Kona Yusuf/Extender: [name] in Treatment: 27 Active Problems Location of Pain Severity and Description of Pain Patient Has Paino Yes Site Locations Pain Location: Pain in Ulcers Rate the pain. Current Pain Level: 7 Character of Pain Describe the Pain: Burning Pain Management and Medication Current Pain Management: Electronic Signature(s) Signed: 09/29/2021 5:07:29 PM By: Gretta Cool, BSN, RN, CWS, [name] RN, BSN Entered By: Gretta Cool, BSN, RN, CWS, [name] on 09/29/2021 10:06:15 [name] ([id number]) -------------------------------------------------------------------------------- Patient/Caregiver Education Details Patient Name: Heather Dear [name]. Date of Service: 09/29/2021 9:45 AM Medical Record Number: [id number] Patient Account Number: [id number] Date of Birth/Gender: [birth year] (45 y.o. F) Treating RN: [name] Primary Care Physician: [name] Other Clinician: Cornell [name] Referring Physician: Raelene [name]  Treating Physician/Extender: [name] in Treatment: 27 Education Assessment Education Provided To: Patient Education Topics Provided Wound Debridement: Handouts: Wound Debridement Methods: Explain/Verbal Responses: State content correctly Wound/Skin Impairment: Handouts: Caring for Your Ulcer Methods: Explain/Verbal Responses: State content correctly Electronic Signature(s) Signed: 09/29/2021 5:07:29 PM By: Gretta Cool,  BSN, RN, CWS, [name] RN, BSN Entered By: Gretta Cool, BSN, RN, CWS, [name] on 09/29/2021 10:33:19 [name] ([id number]) -------------------------------------------------------------------------------- Wound Assessment Details Patient Name: Heather Dear [name]. Date of Service: 09/29/2021 9:45 AM Medical Record Number: [id number] Patient Account Number: [id number] Date of Birth/Sex: 12-10-1975 (45 y.o. F) Treating RN: [name] Primary Care Shirell Struthers: [name] Other Clinician: Cornell [name] Referring Nikky Duba: [name] Treating Anjelique Makar/Extender: [name] in Treatment: 27 Wound Status Wound Number: 7 Primary Diabetic Wound/Ulcer of the Lower Extremity Etiology: Wound Location: Left, Midline, Plantar Foot Wound Open Wounding Event: Gradually Appeared Status: Date Acquired: 08/07/2020 Comorbid Chronic sinus problems/congestion, Middle ear problems, Weeks Of Treatment: 27 History: Anemia, Chronic Obstructive Pulmonary Disease (COPD), Clustered Wound: No Congestive Heart Failure, Type II Diabetes, End Stage Renal Disease, History of pressure wounds, Neuropathy Photos Wound Measurements Length: (cm) 4.5 Width: (cm) 3.2 Depth: (cm) 0.5 Area: (cm) 11.3 Volume: (cm) 5.65 % Reduction in Area: -323.6% % Reduction in Volume: -323.6% Epithelialization: None 1 5 Wound Description Classification: Grade 2 Wound Margin: Thickened Exudate Amount: Medium Exudate Type: Serosanguineous Exudate Color: red, brown Foul Odor After Cleansing: No Slough/Fibrino Yes Wound Bed Granulation Amount: Large (67-100%) Exposed Structure Granulation Quality: Pink, Hyper-granulation Fascia Exposed: No Necrotic Amount: Small (1-33%) Fat Layer (Subcutaneous Tissue) Exposed: Yes Tendon Exposed: No Muscle Exposed: No Joint Exposed: No Bone Exposed: No Assessment Notes callus surrounding wound Treatment Notes Wound #7 (Foot) Wound Laterality: Plantar, Left,  Midline Cleanser [place], Shadee [name]. ([id number]) Dakin 16 (oz) 0.25 Discharge Instruction: Use as directed. Peri-Wound Care Topical Primary Dressing Gauze Discharge Instruction: As directed: moistened with Dakins Solution Secondary Dressing Secured With Kerlix Roll Sterile or Non-Sterile 6-ply 4.5x4 (yd/yd) Discharge Instruction: Apply Kerlix as directed Compression Wrap Compression Stockings Add-Ons Electronic Signature(s) Signed: 09/29/2021 5:07:29 PM By: Gretta Cool, BSN, RN, CWS, [name] RN, BSN Entered By: Gretta Cool, BSN, RN, CWS, [name] on 09/29/2021 10:27:04 [name] ([id number]) -------------------------------------------------------------------------------- Wound Assessment Details Patient Name: Heather Dear [name]. Date of Service: 09/29/2021 9:45 AM Medical Record Number: [id number] Patient Account Number: [id number] Date of Birth/Sex: 1976-06-08 (45 y.o. F) Treating RN: [name] Primary Care Voris Tigert: [name] Other Clinician: Cornell [name] Referring Deniz Hannan: [name] Treating Arin Vanosdol/Extender: [name] in Treatment: 27 Wound Status Wound Number: 8 Primary Diabetic Wound/Ulcer of the Lower Extremity Etiology: Wound Location: Right, Anterior Lower Leg Wound Open Wounding Event: Gradually Appeared Status: Date Acquired: 06/30/2021 Comorbid Chronic sinus problems/congestion, Middle ear problems, Weeks Of Treatment: 13 History: Anemia, Chronic Obstructive Pulmonary Disease (COPD), Clustered Wound: No Congestive Heart Failure, Type II Diabetes, End Stage Renal Disease, History of pressure wounds, Neuropathy Photos Wound Measurements Length: (cm) 0 Width: (cm) 0 Depth: (cm) 0 Area: (cm) 0 Volume: (cm) 0 % Reduction in Area: 100% % Reduction in Volume: 100% Epithelialization: Large (67-100%) Wound Description Classification: Grade 1 Exudate Amount: None Present Foul Odor After Cleansing: No Slough/Fibrino No Wound Bed Granulation Amount:  None Present (0%) Exposed Structure Necrotic Amount: None Present (0%) Fascia Exposed: No Fat Layer (Subcutaneous Tissue) Exposed: No Tendon Exposed: No Muscle Exposed: No Joint Exposed: No Bone Exposed: No Electronic Signature(s) Signed: 09/29/2021 5:07:29 PM By: Gretta Cool, BSN, RN, CWS, [name] RN, BSN Entered By: Gretta Cool, BSN,  RN, CWS, [name] on 09/29/2021 10:13:25 [name], [name] Kitchen ([id number]) -------------------------------------------------------------------------------- Wound Assessment Details Patient Name: [name], [name] Date of Service: 09/29/2021 9:45 AM Medical Record Number: [id number] Patient Account Number: [id number] Date of Birth/Sex: [birth year] (45 y.o. F) Treating RN: [name] Primary Care Arionna Hoggard: [name] Other Clinician: Cornell [name] Referring Jalee Saine: [name] Treating Erasmo Vertz/Extender: [name] in Treatment: 27 Wound Status Wound Number: 9 Primary Diabetic Wound/Ulcer of the Lower Extremity Etiology: Wound Location: Right Calcaneus Wound Open Wounding Event: Gradually Appeared Status: Date Acquired: 07/18/2021 Comorbid Chronic sinus problems/congestion, Middle ear problems, Weeks Of Treatment: 10 History: Anemia, Chronic Obstructive Pulmonary Disease (COPD), Clustered Wound: No Congestive Heart Failure, Type II Diabetes, End Stage Renal Disease, History of pressure wounds, Neuropathy Photos Wound Measurements Length: (cm) 0.7 Width: (cm) 0.9 Depth: (cm) 0.3 Area: (cm) 0.495 Volume: (cm) 0.148 % Reduction in Area: -50% % Reduction in Volume: 35.9% Epithelialization: None Wound Description Classification: Grade 1 Exudate Amount: Medium Exudate Type: Serosanguineous Exudate Color: red, brown Foul Odor After Cleansing: No Slough/Fibrino Yes Wound Bed Granulation Amount: Small (1-33%) Exposed Structure Granulation Quality: Pink Fascia Exposed: No Necrotic Amount: Large (67-100%) Fat Layer (Subcutaneous Tissue) Exposed:  Yes Necrotic Quality: Adherent Slough Tendon Exposed: No Muscle Exposed: No Joint Exposed: No Bone Exposed: No Treatment Notes Wound #9 (Calcaneus) Wound Laterality: Right Cleanser Soap and Water Discharge Instruction: Gently cleanse wound with antibacterial soap, rinse and pat dry prior to dressing wounds Peri-Wound Care [name], [name] ([id number]) Topical Primary Dressing Hydrofera Blue Ready Transfer Foam, 2.5x2.5 (in/in) Discharge Instruction: Apply Hydrofera Blue Ready to wound bed as directed Secondary Dressing Bordered Gauze Sterile-HBD 4x4 (in/in) Discharge Instruction: Cover wound with Bordered Guaze Sterile as directed Secured With coverlet Compression Wrap Compression Stockings Environmental education officer) Signed: 09/29/2021 5:07:29 PM By: Gretta Cool, BSN, RN, CWS, [name] RN, BSN Entered By: Gretta Cool, BSN, RN, CWS, [name] on 09/29/2021 10:14:17 [name] ([id number]) -------------------------------------------------------------------------------- [place] Details Patient Name: Heather Dear [name]. Date of Service: 09/29/2021 9:45 AM Medical Record Number: [id number] Patient Account Number: [id number] Date of Birth/Sex: [birth year] (45 y.o. F) Treating RN: [name] Primary Care Breann Losano: [name] Other Clinician: Cornell [name] Referring Dionta Larke: [name] Treating Cierria Height/Extender: [name] in Treatment: 27 Vital Signs Time Taken: 10:00 Temperature (F): 98.5 Height (in): 69 Pulse (bpm): 108 Weight (lbs): 185 Respiratory Rate (breaths/min): 16 Body Mass Index (BMI): 27.3 Blood Pressure (mmHg): 129/78 Reference Range: 80 - 120 mg / dl Electronic Signature(s) Signed: 09/29/2021 5:07:29 PM By: Gretta Cool, BSN, RN, CWS, [name] RN, BSN Entered By: Gretta Cool, BSN, RN, CWS, [name] on 09/29/2021 10:05:46

## 2021-10-04 NOTE — Progress Notes (Signed)
AITHANA, KUSHNER ([id number]) Visit Report for 09/29/2021 Chief Complaint Document Details Patient Name: CHASTY, [name] Date of Service: 09/29/2021 9:45 AM Medical Record Number: [id number] Patient Account Number: [id number] Date of Birth/Sex: Jul 29, 1976 (45 y.o. F) Treating RN: Levora Dredge Primary Care Provider: Raelene Bott Other Clinician: Cornell Barman Referring Provider: Raelene Bott Treating Provider/Extender: Yaakov Guthrie in Treatment: 27 Information Obtained from: Patient Chief Complaint 03/19/2021; patient referred by Dr. Luana Shu who has been looking after her left foot for quite a period of time for review of a nonhealing area in the left midfoot Electronic Signature(s) Signed: 09/29/2021 10:43:47 AM By: Kalman Shan DO Entered By: Kalman Shan on 09/29/2021 10:38:32 [name] ([id number]) -------------------------------------------------------------------------------- Debridement Details Patient Name: Heather Dear A. Date of Service: 09/29/2021 9:45 AM Medical Record Number: [id number] Patient Account Number: [id number] Date of Birth/Sex: 1976-06-21 (45 y.o. F) Treating RN: Cornell Barman Primary Care Provider: Raelene Bott Other Clinician: Cornell Barman Referring Provider: Raelene Bott Treating Provider/Extender: Yaakov Guthrie in Treatment: 27 Debridement Performed for Wound #7 Left,Midline,Plantar Foot Assessment: Performed By: Physician Kalman Shan, MD Debridement Type: Debridement Severity of Tissue Pre Debridement: Fat layer exposed Level of Consciousness (Pre- Awake and Alert procedure): Pre-procedure Verification/Time Out Yes - 10:20 Taken: Total Area Debrided (L x W): 4.5 (cm) x 3.2 (cm) = 14.4 (cm) Tissue and other material Viable, Non-Viable, Callus, Subcutaneous, Hyper-granulation debrided: Level: Skin/Subcutaneous Tissue Debridement Description: Excisional Instrument: Curette Bleeding: Moderate Hemostasis  Achieved: Pressure Response to Treatment: Procedure was tolerated well Level of Consciousness (Post- Awake and Alert procedure): Post Debridement Measurements of Total Wound Length: (cm) 4.5 Width: (cm) 3.2 Depth: (cm) 0.5 Volume: (cm) 5.655 Character of Wound/Ulcer Post Debridement: Stable Severity of Tissue Post Debridement: Fat layer exposed Post Procedure Diagnosis Same as Pre-procedure Notes silver nitrate to hyper granulation Electronic Signature(s) Signed: 09/29/2021 10:43:47 AM By: Kalman Shan DO Signed: 09/29/2021 5:07:29 PM By: Gretta Cool, BSN, RN, CWS, [name] RN, BSN Entered By: Gretta Cool, BSN, RN, CWS, [name] on 09/29/2021 10:26:29 [name] ([id number]) -------------------------------------------------------------------------------- Debridement Details Patient Name: Heather Dear A. Date of Service: 09/29/2021 9:45 AM Medical Record Number: [id number] Patient Account Number: [id number] Date of Birth/Sex: [birth year] (45 y.o. F) Treating RN: Cornell Barman Primary Care Provider: Raelene Bott Other Clinician: Cornell Barman Referring Provider: Raelene Bott Treating Provider/Extender: Yaakov Guthrie in Treatment: 27 Debridement Performed for Wound #9 Right Calcaneus Assessment: Performed By: Physician Kalman Shan, MD Debridement Type: Debridement Severity of Tissue Pre Debridement: Fat layer exposed Level of Consciousness (Pre- Awake and Alert procedure): Pre-procedure Verification/Time Out Yes - 10:25 Taken: Total Area Debrided (L x W): 0.7 (cm) x 0.9 (cm) = 0.63 (cm) Tissue and other material Viable, Non-Viable, Callus, Skin: Dermis , Skin: Epidermis debrided: Level: Skin/Epidermis Debridement Description: Selective/Open Wound Instrument: Curette Bleeding: Minimum Hemostasis Achieved: Pressure Response to Treatment: Procedure was tolerated well Level of Consciousness (Post- Awake and Alert procedure): Post Debridement Measurements of Total  Wound Length: (cm) 0.7 Width: (cm) 0.9 Depth: (cm) 0.3 Volume: (cm) 0.148 Character of Wound/Ulcer Post Debridement: Stable Severity of Tissue Post Debridement: Fat layer exposed Post Procedure Diagnosis Same as Pre-procedure Electronic Signature(s) Signed: 09/29/2021 10:43:47 AM By: Kalman Shan DO Signed: 09/29/2021 5:07:29 PM By: Gretta Cool, BSN, RN, CWS, [name] RN, BSN Entered By: Gretta Cool, BSN, RN, CWS, [name] on 09/29/2021 10:29:55 [name] ([id number]) -------------------------------------------------------------------------------- HPI Details Patient Name: Heather Dear A. Date of Service: 09/29/2021 9:45 AM Medical Record Number: [id number] Patient Account Number: [id number] Date of Birth/Sex: 12-20-1975 (45 y.o. F)  Treating RN: Levora Dredge Primary Care Provider: Raelene Bott Other Clinician: Cornell Barman Referring Provider: Raelene Bott Treating Provider/Extender: Yaakov Guthrie in Treatment: 27 History of Present Illness HPI Description: 01/18/18-She is here for initial evaluation of the left great toe ulcer. She is a poor historian in regards to timeframe in detail. She states approximately 4 weeks ago she lacerated her toe on something in the house. She followed up with her primary care who placed her on Bactrim and ultimately a second dose of Bactrim prior to coming to wound clinic. She states she has been treating the toe with peroxide, Betadine and a Band-Aid. She did not check her blood sugar this morning but checked it yesterday morning it was 327; she is unaware of a recent A1c and there are no current records. She saw Dr. she would've orthopedics last week for an old injury to the left ankle, she states he did not see her toe, nor did she bring it to his attention. She smokes approximately 1 pack cigarettes a day. Her social situation is concerning, she arrives this morning with her mother who appears extremely intoxicated/under the influence; her mother  was asked to leave the room and be monitored by the patient's grandmother. The patient's aunt then accompanied the patient and the room throughout the rest of the appointment. We had a lengthy discussion regarding the deleterious effects of uncontrolled hyperglycemia and smoking as it relates to wound healing and overall health. She was strongly encouraged to decrease her smoking and get her diabetes under better control. She states she is currently on a diet and has cut down her [facility] consumption. The left toe is erythematous, macerated and slightly edematous with malodor present. The edema in her left foot is below her baseline, there is no erythema streaking. We will treat her with Santyl, doxycycline; we have ordered and xray, culture and provided a Peg assist surgical shoe and cultured the wound. 01/25/18-She is here in follow-up evaluation for a left great toe ulcer and presents with an abscess to her suprapubic area. She states her blood sugars remain elevated, feeling "sick" and if levels are below 250, but she is trying. She has made no attempt to decrease her smoking stating that we "can't take away her food in her cigarettes". She has been compliant with offloading using the PEG assist you. She is using Santyl daily. the culture obtained last week grew staph aureus and Enterococcus faecalis; continues on the doxycycline and Augmentin was added on Monday. The suprapubic area has erythema, no femoral variation, purple discoloration, minimal induration, was accessed with a cotton tip applicator with sanguinopurulent drainage, this was cultured, I suspect the current antibiotic treatment will cover and we will not add anything to her current treatment plan. She was advised to go to urgent care or ER with any change in redness, induration or fever. 02/01/18-She is here in follow-up evaluation for left great toe ulcers and a new abdominal abscess from last week. She was able to use  packing until earlier this week, where she "forgot it was there". She states she was feeling ill with GI symptoms last week and was not taking her antibiotic. She states her glucose levels have been predominantly less than 200, with occasional levels between 200-250. She thinks this was contributing to her GI symptoms as they have resolved without intervention. There continues to be significant laceration to left toe, otherwise it clinically looks stable/improved. There is now less superficial opening to the lateral aspect  of the great toe that was residual blister. We will transition to [facility] to all wounds, she will continue her Augmentin. If there is no change or deterioration next week for reculture. 02/08/18-She is here in follow-up evaluation for left great toe ulcer and abdominal ulcer. There is an improvement in both wounds. She has been wrapping her left toe with coban, not by our direction, which has created an area of discoloration to the medial aspect; she has been advised to NOT use coban secondary to her neuropathy. She states her glucose levels have been high over this last week ranging from 200-350, she continues to smoke. She admits to being less compliant with her offloading shoe. We will continue with same treatment plan and she will follow-up next week. 02/15/18-She is here in follow-up evaluation for left great toe ulcer and abdominal ulcer. The abdominal ulcer is epithelialized. The left great toe ulcer is improved and all injury from last week using the Coban wrap is resolved, the lateral ulcer is healed. She admits to noncompliance with wearing offloading shoe and admits to glucose levels being greater than 300 most of the week. She continues to smoke and expresses no desire to quit. There is one area medially that probes deeper than it has historically, erythema to the toe and dorsal foot has consistently waxed and waned. There is no overt signs of cellulitis or  infection but we will culture the wound for any occult infection given the new area of depth and erythema. We will hold off on sensitivities for initiation of antibiotic therapy. 02/22/18-She is here in follow up evaluation for left great toe ulcer. There is overall significant improvement in both wound appearance, erythema and edema with changes made last week. She was not initiated on antibiotic therapy. Culture obtained last week showed oxacillin sensitive staph aureus, sensitive to clindamycin. Clindamycin has been called into the pharmacy but she has been instructed to hold off on initiation secondary to overall clinical improvement and her history of antibiotic intolerance. She has been instructed to contact the clinic with any noted changes/deterioration and the wound, erythema, edema and/or pain. She will follow-up next week. She continues to smoke and her glucose levels remain elevated >250; she admits to compliance with offloading shoe 03/01/18 on evaluation today patient appears to be doing fairly well in regard to her left first toe ulcer. She has been tolerating the dressing changes with the [facility] Dressing without complication and overall this has definitely showed signs of improvement according to records as well is what the patient tells me today. I'm very pleased in that regard. She is having no pain today 03/08/18 She is here for follow up evaluation of a left great toe ulcer. She remains non-compliant with glucose control and smoking cessation; glucose levels consistently >200. She states that she got new shoe inserts/peg assist. She admits to compliance with offloading. Since my last evaluation there is significant improvement. We will switch to prisma at this time and she will follow up next week. She is noted to be tachycardic at this appointment, heart rate 120s; she has a history of heart rate 70-130 according to our records. She admits to extreme agitation r/t personal  issues; she was advised to monitor her heartrate and contact her physician if it does not return to a more normal range (<100). She takes cardizem twice daily. 03/15/18-She is here in follow-up evaluation for left great toe ulcer. She remains noncompliant with glucose control and smoking cessation. She admits  to compliance with wearing offloading shoe. The ulcer is improved/stable and we will continue with the same treatment plan and she will follow-up next week 03/22/18-She is here for evaluation for left great toe ulcer. There continues to be significant improvement despite recurrent hyperglycemia (over 500 yesterday) and she continues to smoke. She has been compliant with offloading and we will continue with same treatment plan and she will follow-up next week. 03/29/18-She is here for evaluation for left great toe ulcer. Despite continuing to smoke and uncontrolled diabetes she continues to improve. She is compliant with offloading shoe. We will continue with the same treatment plan and she will follow-up next week 04/05/18- She is here in follow up evaluation for a left great toe ulcer; she presents with small pustule to left fifth toe (resembles ant bite). She admits to compliance with wearing offloading shoe; continues to smoke or have uncontrolled blood glucose control. There is more callus than usual with evidence of bleeding; she denies known trauma. 04/12/18-She is here for evaluation of left great toe ulcer. Despite noncompliance with glycemic control and smoking she continues to make Erdmann, Robertine A. ([id number]) improvement. She continues to wear offloading shoe. The pustule, that was identified last week, to the left fifth toe is resolved. She will follow-up in 2 weeks 05/03/18-she is seen in follow-up evaluation for a left great toe ulcer. She is compliant with offloading, otherwise noncompliant with glycemic control and smoking. She has plateaued and there is minimal improvement noted. We  will transition to [facility], replaced the insert to her surgical shoe and she will follow-up in one week 05/10/18- She is here in follow up evaluation for a left great toe ulcer. It appears stable despite measurement change. We will continue with same treatment plan and follow up next week. 05/24/18-She is seen in follow-up evaluation for a left great toe ulcer. She remains compliant with offloading, has made significant improvement in her diet, decreasing the amount of sugar/soda. She said her recent A1c was 10.9 which is lower than. She did see a diabetic nutritionist/educator yesterday. She continues to smoke. We will continue with the same treatment plan and she'll follow-up next week. 05/31/18- She is seen in follow-up evaluation for left great toe ulcer. She continues to remain compliant with offloading, continues to make improvement in her diet, increasing her water and decreasing the amount of sugar/soda. She does continue to smoke with no desire to quit. We will apply Prisma to the depth and Hydrofera Blue over. We have not received insurance authorization for oasis. She will follow up next week. 06/07/18-She is seen in follow-up evaluation for left great toe ulcer. It has stalled according to today's measurements although base appears stable. She says she saw a diabetic educator yesterday; her average blood sugars are less than 300 which is an improvement for her. She continues to smoke and states "that's my next step" She continues with water over soda. We will order for xray, culture and reinstate ace wrap compression prior to placing apligraf for next week. She is voicing no complaints or concerns. Her dressing will change to iodoflex over the next week in preparation for apligraf. 06/14/18-She is seen in follow-up evaluation for left great toe ulcer. Plain film x-ray performed last week was negative for osteomyelitis. Wound culture obtained last week grew strep B and OSSA; she is  initiated on keflex and cefdinir today; there is erythema to the toe which could be from ace wrap compression, she has a history of wrapping  too tight and has has been encouraged to maintain ace wraps that we place today. We will hold off on application of apligraf today, will apply next week after antibiotic therapy has been initiated. She admits today that she has resumed taking a shower with her foot/toe submerged in water, she has been reminded to keep foot/toe out of the bath water. She will be seen in follow up next week 06/21/18-she is seen in follow-up evaluation for left great toe ulcer. She is tolerating antibiotic therapy with no GI disturbance. The wound is stable. Apligraf was applied today. She has been decreasing her smoking, only had 4 cigarettes yesterday and 1 today. She continues being more compliant in diabetic diet. She will follow-up next week for evaluation of site, if stable will remove at 2 weeks. 06/28/18- She is here in follow up evalution. Apligraf was placed last week, she states the dressing fell off on Tuesday and she was dressing with hydrofera blue. She is healed and will be discharged from the clinic today. She has been instructed to continue with smoking cessation, continue monitoring glucose levels, offloading for an additional 4 weeks and continue with hydrofera blue for additional two weeks for any possible microscopic opening. Readmission: 08/07/18 on evaluation today patient presents for reevaluation concerning the ulcer of her right great toe. She was previously discharged on 06/28/18 healed. Nonetheless she states that this began to show signs of drainage she subsequently went to her primary care provider. Subsequently an x-ray was performed on 08/01/18 which was negative. The patient was also placed on antibiotics at that time. Fortunately they should have been effective for the infection. Nonetheless she's been experiencing some improvement but still has a lot of  drainage coming from the wound itself. 08/14/18 on evaluation today patient's wound actually does show signs of improvement in regard to the erythema at this point. She has completed the antibiotics. With that being said we did discuss the possibility of placing her in a total contact cast as of today although I think that I may want to give this just a little bit more time to ensure nothing recurrence as far as her infection is concerned. I do not want to put in the cast and risk infection at that time if things are not completely resolved. With that being said she is gonna require some debridement today. 08/21/18 on evaluation today patient actually appears to be doing okay in regard to her toe ulcer. She's been tolerating the dressing changes without complication. With that being said it does appear that she is ready and in fact I think it's appropriate for [place] to go ahead and initiate the total contact cast today. Nonetheless she will require some sharp debridement to prepare the wound for application. Overall I feel like things have been progressing well but we do need to do something to get this to close more readily. 08/24/18 patient seen today for reevaluation after having had the total contact cast applied on Tuesday. She seems to have done very well the wound appears to be doing great and overall I'm pleased with the progress that she's made. There were no abnormal areas of rubbing from the cast on her lower extremity. 08/30/18 on evaluation today patient actually appears to be completely healed in regard to her plantar toe ulcer. She tells me at this point she's been having a lot of issues with the cast. She almost fell a couple of times the state shall the step of her dog a couple  times as well. This is been a very frustrating process for her other nonetheless she has completely healed the wound which is excellent news. Overall there does not appear to be the evidence of infection at this  time which is great news. 09/11/18 evaluation today patient presents for follow-up concerning her great toe ulcer on the left which has unfortunately reopened since I last saw her which was only a couple of weeks ago. Unfortunately she was not able to get in to get the shoe and potentially the AFO that's gonna be necessary due to her left foot drop. She continues with offloading shoe but this is not enough to prevent her from reopening it appears. When we last had her in the total contact cast she did well from a healing standpoint but unfortunately the wound reopened as soon as she came out of the cast within just a couple of weeks. 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 This is also part of the reason why she falls on a regular basis. I just do not believe that is gonna be safe for [place] to reinitiate the total contact cast as last time we had this on she fell 3 times one week which is definitely not normal for her. 09/18/18 upon evaluation today the patient actually appears to be doing about the same in regard to her toe ulcer. She did not contact [employer] as I asked her to even though I had given her the prescription. In fact she actually states that she has no idea where the prescription is. She did apparently call [employer] and they told her that all she needed to do was bring the prescription in order to be able to be seen and work on getting the AFO for her left foot. With all that being said she still does not have an appointment and I'm not sure were things stand that regard. I will give her a new prescription today in order to contact them to get this set up. 09/25/18 on evaluation today patient actually appears to be doing about the same in regard to her toes ulcer. She does have a small areas  which seems to have a lot of callous buildup around the edge of the wound which is going to need sharp debridement today. She still is waiting to be scheduled for evaluation with [employer] for possibility of an AFO. She states there supposed to call her tomorrow to get this set up. Unfortunately it does appear that her foot specifically the toe area is showing signs of erythema. There does not appear to be any systemic infection which is in these good news. CHRIS, NARASIMHAN A. ([id number]) 10/02/18 on evaluation today patient actually appears to be doing about the same in regard to her toe ulcer. This really has not done too well although it's not significantly larger it's also not significantly smaller. She has been tolerating the dressing changes without complication. She actually has her appointment with [facility] and [place] tomorrow to hopefully be measured for obtaining and AFO splint. I think this would be helpful preventing this from reoccurring. We had contemplated starting the cast this week although to be honest I am reluctant to do that as she's been having nausea, vomiting, and seizure activity over the past three days.  She has a history of seizures and have been told is nothing that can be done for these. With that being said I do believe that along with the seizures have the nausea vomiting which upon further questioning doesn't seem to be the normal for her and makes me concerned for the possibility of infection or something else going on. I discussed this with the patient and her mother during the office visit today. I do not feel the wound is effective but maybe something else. The responses this was "this just happens to her at times and we don't know why". They did not seem to be interested in going to the hospital to have this checked out further. 10/09/18 on evaluation today patient presents for follow-up concerning her ongoing toe ulcer. She has been tolerating the dressing  changes without complication. Fortunately there does not appear to be any evidence of infection which is great news however I do think that the patient would benefit from going ahead for with the total contact cast. She's actually in a wheelchair today she tells me that she will use her walker if we initiate the cast. I was very specific about the fact that if we were gonna do the cast I wanted to make sure that she was using the walker in order to prevent any falls. She tells me she does not have stairs that she has to traverse on a regular basis at her home. She has not had any seizures since last week again that something that happens to her often she tells me she did talk to [employer] and they said that it may take up to three weeks to get the brace approved for her. Hopefully that will not take that long but nonetheless in the meantime I do think the cast could be of benefit. 10/12/18 on evaluation today patient appears to be doing rather well in regard to her toe ulcer. It's just been a few days and already this is significantly improved both as far as overall appearance and size. Fortunately there's no sign of infection. She is here for her first obligatory cast change. 10/19/18 Seen today for follow up and management of left great toe ulcer. Wound continues to show improvement. Noted small open area with seroussang drainage with palpation. Denies any increased pain or recent fevers during visit. She will continue calcium alginate with offloading shoe. Denies any questions or concerns during visit. 10/26/18 on evaluation today patient appears to be doing about the same as when I last saw her in regard to her wound bed. Fortunately there does not appear to be any signs of infection. Unfortunately she continues to have a breakdown in regard to the toe region any time that she is not in the cast. It takes almost no time at all for this to happen. Nonetheless she still has not heard anything from the  brace being made by [employer] as to when exactly this will be available to her. Fortunately there is no signs of infection at this time. 10/30/18 on evaluation today patient presents for application of the total contact cast as we just received him this morning. Fortunately we are gonna be able to apply this to her today which is great news. She continues to have no significant pain which is good news. Overall I do feel like things have been improving while she was the cast is when she doesn't have a cast that things get worse. She still has not really heard anything from [employer] regarding  her brace. 11/02/18 upon evaluation today patient's wound already appears to be doing significantly better which is good news. Fortunately there does not appear to be any signs of infection also good news. Overall I do think the total contact cast as before is helping to heal this area unfortunately it's just not gonna likely keep the area closed and healed without her getting her brace at least. Again the foot drop is a significant issue for her. 11/09/18 on evaluation today patient appears to be doing excellent in regard to her toe ulcer which in fact is completely healed. Fortunately we finally got the situation squared away with the paperwork which was needed to proceed with getting her brace approved by Medicaid. I have filled that out unfortunately that information has been sent to the orthopedic office that I worked at 2 1/2 years ago and not tired Current wound care measures. Fortunately she seems to be doing very well at this time. 11/23/18 on evaluation today patient appears to be doing More Poorly Compared to Last Time I Saw Her. At [facility] She Had Completely Healed. Currently she is continuing to have issues with reopening. She states that she just found out that the brace was approved through Medicaid now she just has to go get measured in order to have this fitted for her and then made. Subsequently she  does not have an appointment for this yet that is going to complicate things we obviously cannot put her back in the cast if we do not have everything measured because they're not gonna be able to measure her foot while she is in the cast. 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 Obviously this is unfortunate and does have me somewhat worried as well. 11/30/18 on evaluation today patient's toe ulcer actually appears to be doing fairly well. The good news is she will be getting her brace in the shoes next week on Wednesday. Hopefully we will be able to get this to heal without having to go back in the cast however she may need the cast in order to get the wound completely heal and then go from there. Fortunately there's no signs of infection at this time. 12/07/18 on evaluation today patient fortunately did receive her brace and she states she could tell this definitely makes her walk better. With that being said she's been having issues with her toe where she noticed yesterday there was a lot of tissue that was loosing off this appears to be much larger than what it was previous. She also states that her leg has been read putting much across the top of her foot just about the ankle although this seems to be receiving somewhat. The total area is still red and appears to be someone infected as best I can tell. She is previously taken Bactrim and that may be a good option for her today as well. We are gonna see what I wound culture shows as well and I think that this is definitely appropriate. With that being said outside of the culture I still need to initiate something in the interim and that's what I'm gonna go ahead and select Bactrim is a good option for her. 12/14/18 on evaluation today patient appears to be doing better in regard to her left great toe ulcer as compared to last week's evaluation.  There's still some erythema although this is  significantly improved which is excellent news. Overall I do believe that she is making good progress is still gonna take some time before she is where I would like her to be from the standpoint of being able to place her back into the total contact cast. Hopefully we will be where we need to be by next week. 12/21/18 on evaluation today patient actually appears to be doing poorly in regard to her toe ulcer. She's been tolerating the dressing changes without complication. Fortunately there's no signs of systemic infection although she does have a lot of drainage from the toe ulcer and this does seem to be causing some issues at this point. She does have erythema on the distal portion of her toe that appears to be likely cellulitis. 12/28/18 on evaluation today patient actually appears to be doing a little better in my pinion in regard to her toe ulcer. With that being said she still does have some evidence of infection at this time and for her culture she had both E. coli as well as enterococcus as organisms noted on evaluation. For that reason I think that though the Keflex likely has treated the E. coli rather well this has really done nothing for the enterococcus. We are going to have to initiate treatment for this specifically. EMELY, FAHY A. ([id number]) 01/04/19 on evaluation today patient's toe actually appears to be doing better from the standpoint of infection. She currently would like to see about putting the cash back on I think that this is appropriate as long as she takes care of it and keeps it from getting wet. She is gonna have some drainage we can definitely pass this up with Drawtex and alginate to try to prevent as much drainage as possible from causing the problems. With that being said I do want to at least try her with the cast between now and Tuesday. If there any issues we can't continue to use it then I will discontinue the use of  the cast at that point. 01/08/19 on evaluation today patient actually appears to be doing very well as far as her foot ulcer specifically the great toe on the left is concerned. She did have an area of rubbing on the medial aspect of her left ankle which again is from the cast. Fortunately there's no signs of infection at this point in this appears to be a very slight skin breakdown. The patient tells me she felt it rubbing but didn't think it was that bad. Fortunately there is no signs of active infection at this time which is good news. No fevers, chills, nausea, or vomiting noted at this time. 01/15/19 on evaluation today patient actually appears to be doing well in regard to her toe ulcer. Again as previous she seems to do well and she has the cast on which indicates to me that during the time she doesn't have a cast on she's putting way too much pressure on this region. Obviously I think that's gonna be an issue as with the current national emergency concerning the Covid-19 Virus it has been recommended that we discontinue the use of total contact casting by the chief medical officer of our company, Dr. Simona Huh. The reasoning is that if a patient becomes sick and cannot come into have the cast removed they could not just leave this on for an additional two weeks. Obviously the hospitals also do not want to receive patient's who are sick into the emergency department to potentially contaminate the region  and spread the Covid-19 Virus among other sick individuals within the hospital system. Therefore at this point we are suspending the use of total contact cast until the current emergency subsides. This was all discussed with the patient today as well. 01/22/19 on evaluation today patient's wound on her left great toe appears to be doing slightly worse than previously noted last week. She tells me that she has been on this quite a bit in fact she tells me she's been awake for 38 straight hours. This is  due to the fact that she's having to care for grandparents because nobody else will. She has been taking care of them for five the last seven days since I've seen her they both have dementia his is from a stroke and her grandmother's was progressive. Nonetheless she states even her mom who knows her condition and situation has only help two of those days to take care of them she's been taking care of the rest. Fortunately there does not appear to be any signs of active infection in regard to her toe at this point although obviously it doesn't look as good as it did previous. I think this is directly related to her not taking off the pressure and friction by way of taking things easy. Though I completely understand what's going on. 01/29/19 on evaluation today patient's tools are actually appears to be showing some signs of improvement today compared to last week's evaluation as far as not necessarily the overall size of the wound but the fact that she has some new skin growth in between the two ends of the wound opening. Overall I feel like she has done well she states that she had a family member give her what sounds to be a CAM walker boot which has been helpful as well. 02/05/19 on evaluation today patient's wound bed actually appears to be doing significantly better in regard to her overall appearance of the size of the wound. With that being said she is still having an issue with offloading efficiently enough to get this to close. Apparently there is some signs of infection at this point as well unfortunately. Previously she's done well of Augmentin I really do not see anything that needs to be culture currently but there theme and cellulitis of the foot that I'm seeing I'm gonna go ahead and place her on an antibiotic today to try to help clear this up. 02/12/2019 on evaluation today patient actually appears to be doing poorly in regard to her overall wound status. She tells me she has been  using her offloading shoe but actually comes in today wearing her tennis shoe with the AFO brace. Again as I previously discussed with her this is really not sufficient to allow the area to heal appropriately. Nonetheless she continues to be somewhat noncompliant and I do wonder based on what she has told my nurse in the past as to whether or not a good portion of this noncompliance may be recreational drug and alcohol related. She has had a history of heroin overdose and this was fairly recently in the past couple of months that have been seeing her. Nonetheless overall I feel like her wound looks significantly worse today compared to what it was previous. She still has significant erythema despite the Augmentin I am not sure that this is an appropriate medication for her infection I am also concerned that the infection may have gone down into her bone. 02/19/19 on evaluation today patient actually appears to  be doing about the same in regard to her toe ulcer. Unfortunately she continues to show signs of bone exposure and infection at this point. There does not appear to be any evidence of worsening of the infection but I'm also not really sure that it's getting significantly better. She is on the Augmentin which should be sufficient for the Staphylococcus aureus infection that she has at this point. With that being said she may need IV antibiotics to more appropriately treat this. We did have a discussion today about hyperbaric option therapy. 02/28/19 on evaluation today patient actually appears to be doing much worse in regard to the wound on her left great toe as compared to even my previous evaluation last week. Unfortunately this seems to be training in a pretty poor direction. Her toe was actually now starting to angle laterally and I can actually see the entire joint area of the proximal portion of the digit where is the distal portion of the digit again is no longer even in contact with the  joint line. Unfortunately there's a lot more necrotic tissue around the edge and the toe appears to be showing signs of becoming gangrenous in my pinion. I'm very concerned about were things stand at this point. She did see infectious disease and they are planning to send in a prescription for Sivextro for her and apparently this has been approved. With that being said I don't think she should avoid taking this but at the same time I'm not sure that it's gonna be sufficient to save her toe at this point. 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 03/05/19 on evaluation today patient actually appears to be doing quite well in regard to her toe all things considering. She still has bone exposed but there appears to be much less your thing on overall the appearance of the wound and the toe itself is dramatically improved. She still does have some issues currently obviously with infection she did see vascular as well and there concerned that her blood flow to the toad. For that reason they are setting up for an angiogram next week. 03/14/19 on evaluation today patient appears to be doing very poor in regard to her toe and specifically in regard to the ulceration and the fact that she's starting to notice the toe was leaning even more towards the lateral aspect and the complete joint is visible on the proximal aspect of the joint. Nonetheless she's also noted a significant odor and the tip of the toe is turning more dark and necrotic appearing. Overall I think she is getting worse not better as far as this is concerned. For that reason I am recommending at this point that she likely needs to be seen for likely amputation. READMISSION 03/19/2021 This is a patient that we cared for in this clinic for a  prolonged period of time in 2019 and 2020 with a left foot and left first toe wound. 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 ([id number]) 04/09/20 by Dr. Luana Shu. In December 2021 she had an ulcer on her right great toe as well as the fourth and fifth toes. She underwent a partial  ray amputation of the right fourth and fifth toes. She also had an angiogram at that time and underwent angioplasty of the right anterior tibial artery. In any case she claims that the wound on the right foot is closed I did not look at this today which was probably an oversight although I think that should be done next week. After her surgery she developed a dehiscence but I do not see any follow-up of this. According to Dr. Deborra Medina last review that she was out of the area being cared for by another physician but recently came back to his attention. 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 A culture of this area showed E. coli apparently before she came back to see Dr. Luana Shu she was supposed to be receiving antibiotics but she did not really take them. Nor is she offloading this area at all. Finally her last hemoglobin A1c listed in epic was in March 2022 at 14.1 she says things are a lot better since then although I am not sure. 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 She had this wound at the time. During this admission she had E. coli sepsis a TEE was negative. 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 She is still a heavy cigarette smoker.  She has a history of bipolar disorder seizure disorder ABI in our clinic was 1.05 6/1; left midfoot in the setting of a TMA done previously. Round circular wound with a "knuckle" of protruding tissue. The problem is that the knuckle was not attached to any of the surrounding granulation and this probed proximally widely I removed a large portion of this tissue. This wound goes with considerable undermining laterally. I do not feel any bone there was no purulence but this is a deep wound. 6/8; in spite of the debridement I did last week. She arrives with a wound looking exactly the same. A protruding "knuckle" of tissue nonadherent to most of the surrounding tissue. There is considerable depth around this from 6-12 o'clock at 2.7 cm and undermining of 1 cm. This does not look overtly infected and the x-ray I did last week was negative for any osseous abnormalities. We have been using silver collagen 6/15; deep tissue culture I did last week showed moderate staph aureus and moderate Pseudomonas. This will definitely require prolonged antibiotic therapy. The pathology on the protuberant area was negative for malignancy fungus etc. 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 We have been using silver collagen. I am going to be prescribing Levaquin for 2 weeks. Her CT scan of the foot is down for 7/5 6/22; CT scan of the foot on 7 5. She says she has hardware in the left leg from her previous fracture. She is on the Levaquin for the deep tissue culture I did that showed methicillin sensitive staph aureus and Pseudomonas. I gave her a 2-week supply and she will have another week. She arrives in clinic today with the same protuberant tissue however this is nonadherent to the tissue surrounding it. I am really at a loss to explain this unless there is underlying deep tissue infection 6/29; patient presents for 1 week follow-up. She has been  using  collagen to the wound bed. She reports taking her antibiotics as prescribed.She has no complaints or issues today. She denies signs of infection. 7/6; patient presents for one week followup. She has been using collagen to the wound bed. She states she is taking Levaquin however at times she is not able to keep it down. She denies signs of infection. 7/13; patient presents for 1 week follow-up. She has been using silver alginate to the wound bed. She still has nausea when taking her antibiotics. She denies signs of infection. 7/20; patient presents for 1 week follow-up. She has been using silver alginate with gentamicin cream to the wound bed. She denies any issues and has no complaints today. She denies signs of infection. 7/27; patient presents for 1 week follow-up. She continues to use silver alginate with gentamicin cream to the wound bed. She reports starting her antibiotics. She has no issues or complaints. Overall she reports stability to the wound. 8/3; patient presents for 1 week follow-up. She has been using silver alginate with gentamicin cream to the wound bed. She reports completing all antibiotics. She has no issues or complaints today. She denies signs of infection. 8/17; patient presents for 2-week follow-up. He is to use silver alginate to the wound bed. She has no issues or complaints today. She denies signs of infection. She reports her pain has improved in her foot since last clinic visit 8/24; patient presents for 1 week follow-up. She continues to use silver alginate to the wound bed. She has no issues or complaints. She denies signs of infection. Pain is stable. 9/7; patient presents for follow-up. She missed her last week appointment due to feeling ill. She continues to use silver alginate. She has a new wound to the right lower extremity that is covered in eschar. She states It occurred over the past week and has no idea how it started. She currently denies signs of  infection. 9/14; patient presents for follow-up. To the left foot wound she has been using gentamicin cream and silver alginate. To the right lower extremity wound she has been keeping this covered and has not obtain Santyl. 9/21; patient presents for follow-up. She reports using gentamicin cream and silver alginate to the left foot and Santyl to the right lower extremity wound. She has no issues or complaints today. She denies signs of infection. 9/28; patient presents for follow-up. She reports a new wound to her right heel. She states this occurred a few days ago and is progressively gotten worse. She has been trying to clean the area with a Q-tip and Santyl. She reports stability in the other 2 wounds. She has been using gentamicin cream and silver alginate to the left foot and Santyl to the right lower extremity wound. 10/12; patient presents for follow-up. She reports improvement to the wound beds. She is seeing vein and vascular to discuss the potential of a left BKA. She states they are going to do an arteriogram. She continues to use silver alginate with dressing changes to her wounds. 11/2; patient presents for follow-up. She states she has not been doing dressing changes to the wound beds. She states she is not able to offload the areas. She reports chronic pain to her left foot wound. 11/9; patient presents for follow-up. She came in with only socks on. She states she forgot to put on shoes. It is unclear if she is doing any dressing changes. She currently denies systemic signs of infection. 11/16; patient presents for follow-up.  She came again only with socks on. She states she does not wear shoes ever. It is unclear if she does dressing changes. She currently denies systemic signs of infection. 11/23; patient presents for follow-up. She wore her shoes today. It still unclear exactly what dressing she is using for each wound but she did states she obtained Dakin's solution and has been  using this to the left foot wound. She currently denies signs of infection. 11/30; patient presents for follow-up. She has no issues or complaints today. She currently denies signs of infection. 12/7; patient presents for follow-up. She has no issues or complaints today. She has been using Hydrofera Blue to the right heel wound and Dakin solution to the left foot wound. Her right anterior leg wound is healed. She currently denies signs of infection. NACOLE, FLUHR ([id number]) Electronic Signature(s) Signed: 09/29/2021 10:43:47 AM By: Kalman Shan DO Entered By: Kalman Shan on 09/29/2021 10:39:11 [name] ([id number]) -------------------------------------------------------------------------------- Physical Exam Details Patient Name: Heather Dear A. Date of Service: 09/29/2021 9:45 AM Medical Record Number: [id number] Patient Account Number: [id number] Date of Birth/Sex: 1976/01/15 (45 y.o. F) Treating RN: Levora Dredge Primary Care Provider: Raelene Bott Other Clinician: Cornell Barman Referring Provider: Raelene Bott Treating Provider/Extender: Yaakov Guthrie in Treatment: 27 Constitutional . Psychiatric . Notes Left foot (Transmetatarsal amputation): Wound to the plantar aspect that has granulation tissue With scant nonviable tissue and increased pockets of depth. There is circumferential callus. Right lower extremity: To the anterior aspect there is Epithelialization to the previous wound site. Right foot: To the heel there is an open wound with Granulation tissue and non viable tissue present. Circumferential callus. No obvious signs of infection to any wound. Electronic Signature(s) Signed: 09/29/2021 10:43:47 AM By: Kalman Shan DO Entered By: Kalman Shan on 09/29/2021 10:40:28 [name] ([id number]) -------------------------------------------------------------------------------- Physician Orders Details Patient Name: Heather Dear  A. Date of Service: 09/29/2021 9:45 AM Medical Record Number: [id number] Patient Account Number: [id number] Date of Birth/Sex: 12/01/1975 (45 y.o. F) Treating RN: Cornell Barman Primary Care Provider: Raelene Bott Other Clinician: Cornell Barman Referring Provider: Raelene Bott Treating Provider/Extender: Yaakov Guthrie in Treatment: 72 Verbal / Phone Orders: No Diagnosis Coding Follow-up Appointments o Return Appointment in 1 week. Bathing/ Shower/ Hygiene o Wash wounds with antibacterial soap and water. Off-Loading Left Lower Extremity o Open toe surgical shoe with peg assist. o Turn and reposition every 2 hours o Other: - peg shoe right, off-loader left Medications-Please add to medication list. o P.O. Antibiotics Wound Treatment Wound #7 - Foot Wound Laterality: Plantar, Left, Midline Cleanser: Dakin 16 (oz) 0.25 1 x Per Day/30 Days Discharge Instructions: Use as directed. Primary Dressing: Gauze 1 x Per Day/30 Days Discharge Instructions: As directed: moistened with Dakins Solution Secured With: Kerlix Roll Sterile or Non-Sterile 6-ply 4.5x4 (yd/yd) 1 x Per Day/30 Days Discharge Instructions: Apply Kerlix as directed Wound #9 - Calcaneus Wound Laterality: Right Cleanser: Soap and Water 1 x Per Day/30 Days Discharge Instructions: Gently cleanse wound with antibacterial soap, rinse and pat dry prior to dressing wounds Primary Dressing: Hydrofera Blue Ready Transfer Foam, 2.5x2.5 (in/in) 1 x Per Day/30 Days Discharge Instructions: Apply Hydrofera Blue Ready to wound bed as directed Secondary Dressing: Bordered Gauze Sterile-HBD 4x4 (in/in) 1 x Per Day/30 Days Discharge Instructions: Cover wound with Bordered Guaze Sterile as directed Secured With: coverlet 1 x Per Day/30 Days Electronic Signature(s) Signed: 09/29/2021 10:43:47 AM By: Kalman Shan DO Entered By: Kalman Shan on 09/29/2021 10:42:37 Mcghee, Aisling  A.  ([id number]) -------------------------------------------------------------------------------- Problem List Details Patient Name: MCGAUGHEY, Velia A. Date of Service: 09/29/2021 9:45 AM Medical Record Number: [id number] Patient Account Number: [id number] Date of Birth/Sex: Aug 11, 1976 (45 y.o. 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 03/19/2021 No Yes severity L97.512 Non-pressure chronic ulcer of other part of right foot with fat layer 09/15/2021 No Yes exposed L97.812 Non-pressure chronic ulcer of other part of right lower leg with fat layer 09/15/2021 No Yes exposed E11.621 Type 2 diabetes mellitus with foot ulcer 03/19/2021 No Yes E11.42 Type 2 diabetes mellitus with diabetic polyneuropathy 03/19/2021 No Yes Inactive Problems ICD-10 Code Description Active Date Inactive Date S81.801A Unspecified open wound, right lower leg, initial encounter 06/30/2021 06/30/2021 Resolved Problems Electronic Signature(s) Signed: 09/29/2021 10:43:47 AM By: Kalman Shan DO Entered By: Kalman Shan on 09/29/2021 10:38:20 [name] ([id number]) -------------------------------------------------------------------------------- Progress Note Details Patient Name: Heather Dear A. Date of Service: 09/29/2021 9:45 AM Medical Record Number: [id number] Patient Account Number: [id number] Date of Birth/Sex: May 13, 1976 (45 y.o. F) Treating RN: Levora Dredge Primary Care Provider: Raelene Bott Other Clinician: Cornell Barman Referring Provider: Raelene Bott Treating Provider/Extender: Yaakov Guthrie in Treatment: 27 Subjective Chief Complaint Information obtained from  Patient 03/19/2021; patient referred by Dr. Luana Shu who has been looking after her left foot for quite a period of time for review of a nonhealing area in the left midfoot History of Present Illness (HPI) 01/18/18-She is here for initial evaluation of the left great toe ulcer. She is a poor historian in regards to timeframe in detail. She states approximately 4 weeks ago she lacerated her toe on something in the house. She followed up with her primary care who placed her on Bactrim and ultimately a second dose of Bactrim prior to coming to wound clinic. She states she has been treating the toe with peroxide, Betadine and a Band-Aid. She did not check her blood sugar this morning but checked it yesterday morning it was 327; she is unaware of a recent A1c and there are no current records. She saw Dr. she would've orthopedics last week for an old injury to the left ankle, she states he did not see her toe, nor did she bring it to his attention. She smokes approximately 1 pack cigarettes a day. Her social situation is concerning, she arrives this morning with her mother who appears extremely intoxicated/under the influence; her mother was asked to leave the room and be monitored by the patient's grandmother. The patient's aunt then accompanied the patient and the room throughout the rest of the appointment. We had a lengthy discussion regarding the deleterious effects of uncontrolled hyperglycemia and smoking as it relates to wound healing and overall health. She was strongly encouraged to decrease her smoking and get her diabetes under better control. She states she is currently on a diet and has cut down her [facility] consumption. The left toe is erythematous, macerated and slightly edematous with malodor present. The edema in her left foot is below her baseline, there is no erythema streaking. We will treat her with Santyl, doxycycline; we have ordered and xray, culture and provided a Peg assist  surgical shoe and cultured the wound. 01/25/18-She is here in follow-up evaluation for a left great toe ulcer and presents with  an abscess to her suprapubic area. She states her blood sugars remain elevated, feeling "sick" and if levels are below 250, but she is trying. She has made no attempt to decrease her smoking stating that we "can't take away her food in her cigarettes". She has been compliant with offloading using the PEG assist you. She is using Santyl daily. the culture obtained last week grew staph aureus and Enterococcus faecalis; continues on the doxycycline and Augmentin was added on Monday. The suprapubic area has erythema, no femoral variation, purple discoloration, minimal induration, was accessed with a cotton tip applicator with sanguinopurulent drainage, this was cultured, I suspect the current antibiotic treatment will cover and we will not add anything to her current treatment plan. She was advised to go to urgent care or ER with any change in redness, induration or fever. 02/01/18-She is here in follow-up evaluation for left great toe ulcers and a new abdominal abscess from last week. She was able to use packing until earlier this week, where she "forgot it was there". She states she was feeling ill with GI symptoms last week and was not taking her antibiotic. She states her glucose levels have been predominantly less than 200, with occasional levels between 200-250. She thinks this was contributing to her GI symptoms as they have resolved without intervention. There continues to be significant laceration to left toe, otherwise it clinically looks stable/improved. There is now less superficial opening to the lateral aspect of the great toe that was residual blister. We will transition to [facility] to all wounds, she will continue her Augmentin. If there is no change or deterioration next week for reculture. 02/08/18-She is here in follow-up evaluation for left great toe ulcer  and abdominal ulcer. There is an improvement in both wounds. She has been wrapping her left toe with coban, not by our direction, which has created an area of discoloration to the medial aspect; she has been advised to NOT use coban secondary to her neuropathy. She states her glucose levels have been high over this last week ranging from 200-350, she continues to smoke. She admits to being less compliant with her offloading shoe. We will continue with same treatment plan and she will follow-up next week. 02/15/18-She is here in follow-up evaluation for left great toe ulcer and abdominal ulcer. The abdominal ulcer is epithelialized. The left great toe ulcer is improved and all injury from last week using the Coban wrap is resolved, the lateral ulcer is healed. She admits to noncompliance with wearing offloading shoe and admits to glucose levels being greater than 300 most of the week. She continues to smoke and expresses no desire to quit. There is one area medially that probes deeper than it has historically, erythema to the toe and dorsal foot has consistently waxed and waned. There is no overt signs of cellulitis or infection but we will culture the wound for any occult infection given the new area of depth and erythema. We will hold off on sensitivities for initiation of antibiotic therapy. 02/22/18-She is here in follow up evaluation for left great toe ulcer. There is overall significant improvement in both wound appearance, erythema and edema with changes made last week. She was not initiated on antibiotic therapy. Culture obtained last week showed oxacillin sensitive staph aureus, sensitive to clindamycin. Clindamycin has been called into the pharmacy but she has been instructed to hold off on initiation secondary to overall clinical improvement and her history of antibiotic intolerance. She has been instructed  to contact the clinic with any noted changes/deterioration and the wound, erythema,  edema and/or pain. She will follow-up next week. She continues to smoke and her glucose levels remain elevated >250; she admits to compliance with offloading shoe 03/01/18 on evaluation today patient appears to be doing fairly well in regard to her left first toe ulcer. She has been tolerating the dressing changes with the [facility] Dressing without complication and overall this has definitely showed signs of improvement according to records as well is what the patient tells me today. I'm very pleased in that regard. She is having no pain today 03/08/18 She is here for follow up evaluation of a left great toe ulcer. She remains non-compliant with glucose control and smoking cessation; glucose levels consistently >200. She states that she got new shoe inserts/peg assist. She admits to compliance with offloading. Since my last evaluation there is significant improvement. We will switch to prisma at this time and she will follow up next week. She is noted to be tachycardic at this appointment, heart rate 120s; she has a history of heart rate 70-130 according to our records. She admits to extreme agitation r/t personal issues; she was advised to monitor her heartrate and contact her physician if it does not return to a more normal range (<100). She takes cardizem twice daily. 03/15/18-She is here in follow-up evaluation for left great toe ulcer. She remains noncompliant with glucose control and smoking cessation. She admits to compliance with wearing offloading shoe. The ulcer is improved/stable and we will continue with the same treatment plan and she will follow-up next week 03/22/18-She is here for evaluation for left great toe ulcer. There continues to be significant improvement despite recurrent hyperglycemia (over 500 yesterday) and she continues to smoke. She has been compliant with offloading and we will continue with same treatment plan and she will follow-up next week. Defrank, Brenee A.  ([id number]) 03/29/18-She is here for evaluation for left great toe ulcer. Despite continuing to smoke and uncontrolled diabetes she continues to improve. She is compliant with offloading shoe. We will continue with the same treatment plan and she will follow-up next week 04/05/18- She is here in follow up evaluation for a left great toe ulcer; she presents with small pustule to left fifth toe (resembles ant bite). She admits to compliance with wearing offloading shoe; continues to smoke or have uncontrolled blood glucose control. There is more callus than usual with evidence of bleeding; she denies known trauma. 04/12/18-She is here for evaluation of left great toe ulcer. Despite noncompliance with glycemic control and smoking she continues to make improvement. She continues to wear offloading shoe. The pustule, that was identified last week, to the left fifth toe is resolved. She will follow-up in 2 weeks 05/03/18-she is seen in follow-up evaluation for a left great toe ulcer. She is compliant with offloading, otherwise noncompliant with glycemic control and smoking. She has plateaued and there is minimal improvement noted. We will transition to [facility], replaced the insert to her surgical shoe and she will follow-up in one week 05/10/18- She is here in follow up evaluation for a left great toe ulcer. It appears stable despite measurement change. We will continue with same treatment plan and follow up next week. 05/24/18-She is seen in follow-up evaluation for a left great toe ulcer. She remains compliant with offloading, has made significant improvement in her diet, decreasing the amount of sugar/soda. She said her recent A1c was 10.9 which is lower than. She  did see a diabetic nutritionist/educator yesterday. She continues to smoke. We will continue with the same treatment plan and she'll follow-up next week. 05/31/18- She is seen in follow-up evaluation for left great toe ulcer. She continues to  remain compliant with offloading, continues to make improvement in her diet, increasing her water and decreasing the amount of sugar/soda. She does continue to smoke with no desire to quit. We will apply Prisma to the depth and Hydrofera Blue over. We have not received insurance authorization for oasis. She will follow up next week. 06/07/18-She is seen in follow-up evaluation for left great toe ulcer. It has stalled according to today's measurements although base appears stable. She says she saw a diabetic educator yesterday; her average blood sugars are less than 300 which is an improvement for her. She continues to smoke and states "that's my next step" She continues with water over soda. We will order for xray, culture and reinstate ace wrap compression prior to placing apligraf for next week. She is voicing no complaints or concerns. Her dressing will change to iodoflex over the next week in preparation for apligraf. 06/14/18-She is seen in follow-up evaluation for left great toe ulcer. Plain film x-ray performed last week was negative for osteomyelitis. Wound culture obtained last week grew strep B and OSSA; she is initiated on keflex and cefdinir today; there is erythema to the toe which could be from ace wrap compression, she has a history of wrapping too tight and has has been encouraged to maintain ace wraps that we place today. We will hold off on application of apligraf today, will apply next week after antibiotic therapy has been initiated. She admits today that she has resumed taking a shower with her foot/toe submerged in water, she has been reminded to keep foot/toe out of the bath water. She will be seen in follow up next week 06/21/18-she is seen in follow-up evaluation for left great toe ulcer. She is tolerating antibiotic therapy with no GI disturbance. The wound is stable. Apligraf was applied today. She has been decreasing her smoking, only had 4 cigarettes yesterday and 1 today.  She continues being more compliant in diabetic diet. She will follow-up next week for evaluation of site, if stable will remove at 2 weeks. 06/28/18- She is here in follow up evalution. Apligraf was placed last week, she states the dressing fell off on Tuesday and she was dressing with hydrofera blue. She is healed and will be discharged from the clinic today. She has been instructed to continue with smoking cessation, continue monitoring glucose levels, offloading for an additional 4 weeks and continue with hydrofera blue for additional two weeks for any possible microscopic opening. Readmission: 08/07/18 on evaluation today patient presents for reevaluation concerning the ulcer of her right great toe. She was previously discharged on 06/28/18 healed. Nonetheless she states that this began to show signs of drainage she subsequently went to her primary care provider. Subsequently an x-ray was performed on 08/01/18 which was negative. The patient was also placed on antibiotics at that time. Fortunately they should have been effective for the infection. Nonetheless she's been experiencing some improvement but still has a lot of drainage coming from the wound itself. 08/14/18 on evaluation today patient's wound actually does show signs of improvement in regard to the erythema at this point. She has completed the antibiotics. With that being said we did discuss the possibility of placing her in a total contact cast as of today although I think that  I may want to give this just a little bit more time to ensure nothing recurrence as far as her infection is concerned. I do not want to put in the cast and risk infection at that time if things are not completely resolved. With that being said she is gonna require some debridement today. 08/21/18 on evaluation today patient actually appears to be doing okay in regard to her toe ulcer. She's been tolerating the dressing changes without complication. With that being  said it does appear that she is ready and in fact I think it's appropriate for [place] to go ahead and initiate the total contact cast today. Nonetheless she will require some sharp debridement to prepare the wound for application. Overall I feel like things have been progressing well but we do need to do something to get this to close more readily. 08/24/18 patient seen today for reevaluation after having had the total contact cast applied on Tuesday. She seems to have done very well the wound appears to be doing great and overall I'm pleased with the progress that she's made. There were no abnormal areas of rubbing from the cast on her lower extremity. 08/30/18 on evaluation today patient actually appears to be completely healed in regard to her plantar toe ulcer. She tells me at this point she's been having a lot of issues with the cast. She almost fell a couple of times the state shall the step of her dog a couple times as well. This is been a very frustrating process for her other nonetheless she has completely healed the wound which is excellent news. Overall there does not appear to be the evidence of infection at this time which is great news. 09/11/18 evaluation today patient presents for follow-up concerning her great toe ulcer on the left which has unfortunately reopened since I last saw her which was only a couple of weeks ago. Unfortunately she was not able to get in to get the shoe and potentially the AFO that's gonna be necessary due to her left foot drop. She continues with offloading shoe but this is not enough to prevent her from reopening it appears. When we last had her in the total contact cast she did well from a healing standpoint but unfortunately the wound reopened as soon as she came out of the cast within just a couple of weeks. 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 This is also part of the reason why she falls on a regular basis. I just do not believe that is gonna be safe for [place] to reinitiate the total contact cast as last time we had this on she fell 3 times one week which is definitely not normal for her. 09/18/18 upon evaluation today the patient actually appears to be doing about the same in regard to her toe ulcer. She did not contact [facility] as I asked her to even though I had given her the prescription. In fact she actually states that she has no idea where the prescription is. She did apparently call [facility] and they told her that all she needed to do was bring the prescription in order to be able to be seen and work on getting the AFO for her left foot. With all that being said she  still does not have an appointment and I'm not sure were things stand that regard. I will give her a new prescription today in order to contact them to get this set up. MAEVEN, MCDOUGALL A. ([id number]) 09/25/18 on evaluation today patient actually appears to be doing about the same in regard to her toes ulcer. She does have a small areas which seems to have a lot of callous buildup around the edge of the wound which is going to need sharp debridement today. She still is waiting to be scheduled for evaluation with [facility] for possibility of an AFO. She states there supposed to call her tomorrow to get this set up. Unfortunately it does appear that her foot specifically the toe area is showing signs of erythema. There does not appear to be any systemic infection which is in these good news. 10/02/18 on evaluation today patient actually appears to be doing about the same in regard to her toe ulcer. This really has not done too well although it's not significantly larger it's also not significantly smaller. She has been tolerating the dressing changes without complication. She actually has her  appointment with [facility] and [place] tomorrow to hopefully be measured for obtaining and AFO splint. I think this would be helpful preventing this from reoccurring. We had contemplated starting the cast this week although to be honest I am reluctant to do that as she's been having nausea, vomiting, and seizure activity over the past three days. She has a history of seizures and have been told is nothing that can be done for these. With that being said I do believe that along with the seizures have the nausea vomiting which upon further questioning doesn't seem to be the normal for her and makes me concerned for the possibility of infection or something else going on. I discussed this with the patient and her mother during the office visit today. I do not feel the wound is effective but maybe something else. The responses this was "this just happens to her at times and we don't know why". They did not seem to be interested in going to the hospital to have this checked out further. 10/09/18 on evaluation today patient presents for follow-up concerning her ongoing toe ulcer. She has been tolerating the dressing changes without complication. Fortunately there does not appear to be any evidence of infection which is great news however I do think that the patient would benefit from going ahead for with the total contact cast. She's actually in a wheelchair today she tells me that she will use her walker if we initiate the cast. I was very specific about the fact that if we were gonna do the cast I wanted to make sure that she was using the walker in order to prevent any falls. She tells me she does not have stairs that she has to traverse on a regular basis at her home. She has not had any seizures since last week again that something that happens to her often she tells me she did talk to [employer] and they said that it may take up to three weeks to get the brace approved for her. Hopefully that will not take  that long but nonetheless in the meantime I do think the cast could be of benefit. 10/12/18 on evaluation today patient appears to be doing rather well in regard to her toe ulcer. It's just been a few days and already this is significantly improved both as far as overall appearance and  size. Fortunately there's no sign of infection. She is here for her first obligatory cast change. 10/19/18 Seen today for follow up and management of left great toe ulcer. Wound continues to show improvement. Noted small open area with seroussang drainage with palpation. Denies any increased pain or recent fevers during visit. She will continue calcium alginate with offloading shoe. Denies any questions or concerns during visit. 10/26/18 on evaluation today patient appears to be doing about the same as when I last saw her in regard to her wound bed. Fortunately there does not appear to be any signs of infection. Unfortunately she continues to have a breakdown in regard to the toe region any time that she is not in the cast. It takes almost no time at all for this to happen. Nonetheless she still has not heard anything from the brace being made by [employer] as to when exactly this will be available to her. Fortunately there is no signs of infection at this time. 10/30/18 on evaluation today patient presents for application of the total contact cast as we just received him this morning. Fortunately we are gonna be able to apply this to her today which is great news. She continues to have no significant pain which is good news. Overall I do feel like things have been improving while she was the cast is when she doesn't have a cast that things get worse. She still has not really heard anything from [place] regarding her brace. 11/02/18 upon evaluation today patient's wound already appears to be doing significantly better which is good news. Fortunately there does not appear to be any signs of infection also good news. Overall I  do think the total contact cast as before is helping to heal this area unfortunately it's just not gonna likely keep the area closed and healed without her getting her brace at least. Again the foot drop is a significant issue for her. 11/09/18 on evaluation today patient appears to be doing excellent in regard to her toe ulcer which in fact is completely healed. Fortunately we finally got the situation squared away with the paperwork which was needed to proceed with getting her brace approved by Medicaid. I have filled that out unfortunately that information has been sent to the orthopedic office that I worked at 2 1/2 years ago and not tired Current wound care measures. Fortunately she seems to be doing very well at this time. 11/23/18 on evaluation today patient appears to be doing More Poorly Compared to Last Time I Saw Her. At [facility] She Had Completely Healed. Currently she is continuing to have issues with reopening. She states that she just found out that the brace was approved through Medicaid now she just has to go get measured in order to have this fitted for her and then made. Subsequently she does not have an appointment for this yet that is going to complicate things we obviously cannot put her back in the cast if we do not have everything measured because they're not gonna be able to measure her foot while she is in the cast. 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 Obviously this is unfortunate and does have me somewhat worried as well. 11/30/18 on evaluation today patient's toe ulcer actually appears to be doing fairly well. The good news is she will be getting her brace in the shoes next week on  Wednesday. Hopefully we will be able to get this to heal without having to go back in the cast however she may need the cast in order to get the wound completely heal and then go from there.  Fortunately there's no signs of infection at this time. 12/07/18 on evaluation today patient fortunately did receive her brace and she states she could tell this definitely makes her walk better. With that being said she's been having issues with her toe where she noticed yesterday there was a lot of tissue that was loosing off this appears to be much larger than what it was previous. She also states that her leg has been read putting much across the top of her foot just about the ankle although this seems to be receiving somewhat. The total area is still red and appears to be someone infected as best I can tell. She is previously taken Bactrim and that may be a good option for her today as well. We are gonna see what I wound culture shows as well and I think that this is definitely appropriate. With that being said outside of the culture I still need to initiate something in the interim and that's what I'm gonna go ahead and select Bactrim is a good option for her. 12/14/18 on evaluation today patient appears to be doing better in regard to her left great toe ulcer as compared to last week's evaluation. There's still some erythema although this is significantly improved which is excellent news. Overall I do believe that she is making good progress is still gonna take some time before she is where I would like her to be from the standpoint of being able to place her back into the total contact cast. Hopefully we will be where we need to be by next week. 12/21/18 on evaluation today patient actually appears to be doing poorly in regard to her toe ulcer. She's been tolerating the dressing changes without complication. Fortunately there's no signs of systemic infection although she does have a lot of drainage from the toe ulcer and this does Wojnarowski, Paolina A. ([id number]) seem to be causing some issues at this point. She does have erythema on the distal portion of her toe that appears to be likely  cellulitis. 12/28/18 on evaluation today patient actually appears to be doing a little better in my pinion in regard to her toe ulcer. With that being said she still does have some evidence of infection at this time and for her culture she had both E. coli as well as enterococcus as organisms noted on evaluation. For that reason I think that though the Keflex likely has treated the E. coli rather well this has really done nothing for the enterococcus. We are going to have to initiate treatment for this specifically. 01/04/19 on evaluation today patient's toe actually appears to be doing better from the standpoint of infection. She currently would like to see about putting the cash back on I think that this is appropriate as long as she takes care of it and keeps it from getting wet. She is gonna have some drainage we can definitely pass this up with Drawtex and alginate to try to prevent as much drainage as possible from causing the problems. With that being said I do want to at least try her with the cast between now and Tuesday. If there any issues we can't continue to use it then I will discontinue the use of the cast at that point. 01/08/19 on  evaluation today patient actually appears to be doing very well as far as her foot ulcer specifically the great toe on the left is concerned. She did have an area of rubbing on the medial aspect of her left ankle which again is from the cast. Fortunately there's no signs of infection at this point in this appears to be a very slight skin breakdown. The patient tells me she felt it rubbing but didn't think it was that bad. Fortunately there is no signs of active infection at this time which is good news. No fevers, chills, nausea, or vomiting noted at this time. 01/15/19 on evaluation today patient actually appears to be doing well in regard to her toe ulcer. Again as previous she seems to do well and she has the cast on which indicates to me that during the time  she doesn't have a cast on she's putting way too much pressure on this region. Obviously I think that's gonna be an issue as with the current national emergency concerning the Covid-19 Virus it has been recommended that we discontinue the use of total contact casting by the chief medical officer of our company, Dr. Simona Huh. The reasoning is that if a patient becomes sick and cannot come into have the cast removed they could not just leave this on for an additional two weeks. Obviously the hospitals also do not want to receive patient's who are sick into the emergency department to potentially contaminate the region and spread the Covid-19 Virus among other sick individuals within the hospital system. Therefore at this point we are suspending the use of total contact cast until the current emergency subsides. This was all discussed with the patient today as well. 01/22/19 on evaluation today patient's wound on her left great toe appears to be doing slightly worse than previously noted last week. She tells me that she has been on this quite a bit in fact she tells me she's been awake for 38 straight hours. This is due to the fact that she's having to care for grandparents because nobody else will. She has been taking care of them for five the last seven days since I've seen her they both have dementia his is from a stroke and her grandmother's was progressive. Nonetheless she states even her mom who knows her condition and situation has only help two of those days to take care of them she's been taking care of the rest. Fortunately there does not appear to be any signs of active infection in regard to her toe at this point although obviously it doesn't look as good as it did previous. I think this is directly related to her not taking off the pressure and friction by way of taking things easy. Though I completely understand what's going on. 01/29/19 on evaluation today patient's tools are actually appears to  be showing some signs of improvement today compared to last week's evaluation as far as not necessarily the overall size of the wound but the fact that she has some new skin growth in between the two ends of the wound opening. Overall I feel like she has done well she states that she had a family member give her what sounds to be a CAM walker boot which has been helpful as well. 02/05/19 on evaluation today patient's wound bed actually appears to be doing significantly better in regard to her overall appearance of the size of the wound. With that being said she is still having an issue with offloading  efficiently enough to get this to close. Apparently there is some signs of infection at this point as well unfortunately. Previously she's done well of Augmentin I really do not see anything that needs to be culture currently but there theme and cellulitis of the foot that I'm seeing I'm gonna go ahead and place her on an antibiotic today to try to help clear this up. 02/12/2019 on evaluation today patient actually appears to be doing poorly in regard to her overall wound status. She tells me she has been using her offloading shoe but actually comes in today wearing her tennis shoe with the AFO brace. Again as I previously discussed with her this is really not sufficient to allow the area to heal appropriately. Nonetheless she continues to be somewhat noncompliant and I do wonder based on what she has told my nurse in the past as to whether or not a good portion of this noncompliance may be recreational drug and alcohol related. She has had a history of heroin overdose and this was fairly recently in the past couple of months that have been seeing her. Nonetheless overall I feel like her wound looks significantly worse today compared to what it was previous. She still has significant erythema despite the Augmentin I am not sure that this is an appropriate medication for her infection I am also concerned  that the infection may have gone down into her bone. 02/19/19 on evaluation today patient actually appears to be doing about the same in regard to her toe ulcer. Unfortunately she continues to show signs of bone exposure and infection at this point. There does not appear to be any evidence of worsening of the infection but I'm also not really sure that it's getting significantly better. She is on the Augmentin which should be sufficient for the Staphylococcus aureus infection that she has at this point. With that being said she may need IV antibiotics to more appropriately treat this. We did have a discussion today about hyperbaric option therapy. 02/28/19 on evaluation today patient actually appears to be doing much worse in regard to the wound on her left great toe as compared to even my previous evaluation last week. Unfortunately this seems to be training in a pretty poor direction. Her toe was actually now starting to angle laterally and I can actually see the entire joint area of the proximal portion of the digit where is the distal portion of the digit again is no longer even in contact with the joint line. Unfortunately there's a lot more necrotic tissue around the edge and the toe appears to be showing signs of becoming gangrenous in my pinion. I'm very concerned about were things stand at this point. She did see infectious disease and they are planning to send in a prescription for Sivextro for her and apparently this has been approved. With that being said I don't think she should avoid taking this but at the same time I'm not sure that it's gonna be sufficient to save her toe at this point. 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 03/05/19 on evaluation today patient actually appears to be  doing quite well in regard to her toe all things considering. She still has bone exposed but there appears to be much less  your thing on overall the appearance of the wound and the toe itself is dramatically improved. She still does have some issues currently obviously with infection she did see vascular as well and there concerned that her blood flow to the toad. For that reason they are setting up for an angiogram next week. 03/14/19 on evaluation today patient appears to be doing very poor in regard to her toe and specifically in regard to the ulceration and the fact that she's starting to notice the toe was leaning even more towards the lateral aspect and the complete joint is visible on the proximal aspect of the joint. Nonetheless she's also noted a significant odor and the tip of the toe is turning more dark and necrotic appearing. Overall I think she is getting worse not better as far as this is concerned. For that reason I am recommending at this point that she likely needs to be seen for likely amputation. SHAWNELLE, SPOERL ([id number]) READMISSION 03/19/2021 This is a patient that we cared for in this clinic for a prolonged period of time in 2019 and 2020 with a left foot and left first toe wound. 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 Luana Shu. In December 2021 she had an ulcer on her right great toe as well as the fourth and fifth toes. 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 She also had an angiogram at that time and underwent angioplasty of the right anterior tibial artery. In any case she claims that the wound on the right foot is closed I did not look at this today which was probably an oversight although I think that should be done next week. After her surgery she developed a dehiscence but I do not see any follow-up of this.  According to Dr. Deborra Medina last review that she was out of the area being cared for by another physician but recently came back to his attention. 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 A culture of this area showed E. coli apparently before she came back to see Dr. Luana Shu she was supposed to be receiving antibiotics but she did not really take them. Nor is she offloading this area at all. Finally her last hemoglobin A1c listed in epic was in March 2022 at 14.1 she says things are a lot better since then although I am not sure. 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 She had this wound at the time. During this admission she had E. coli sepsis a TEE was negative. 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 She is still a heavy cigarette smoker. She has a history of bipolar disorder seizure disorder ABI in our clinic was 1.05 6/1; left midfoot in the setting of a TMA done previously. Round circular wound with a "knuckle" of protruding tissue. The problem is that the knuckle was not attached to any of the surrounding granulation and this probed proximally widely I removed a large portion of this tissue. This wound goes with considerable undermining laterally. I do not feel any bone there was no purulence but  this is a deep wound. 6/8; in spite of the debridement I did last week. She arrives with a wound looking exactly the same. A protruding "knuckle" of tissue nonadherent to most of the surrounding tissue. There is considerable depth around this from 6-12 o'clock at 2.7 cm and undermining of 1 cm. This does not look overtly infected and the x-ray I  did last week was negative for any osseous abnormalities. We have been using silver collagen 6/15; deep tissue culture I did last week showed moderate staph aureus and moderate Pseudomonas. This will definitely require prolonged antibiotic therapy. The pathology on the protuberant area was negative for malignancy fungus etc. 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 We have been using silver collagen. I am going to be prescribing Levaquin for 2 weeks. Her CT scan of the foot is down for 7/5 6/22; CT scan of the foot on 7 5. She says she has hardware in the left leg from her previous fracture. She is on the Levaquin for the deep tissue culture I did that showed methicillin sensitive staph aureus and Pseudomonas. I gave her a 2-week supply and she will have another week. She arrives in clinic today with the same protuberant tissue however this is nonadherent to the tissue surrounding it. I am really at a loss to explain this unless there is underlying deep tissue infection 6/29; patient presents for 1 week follow-up. She has been using collagen to the wound bed. She reports taking her antibiotics as prescribed.She has no complaints or issues today. She denies signs of infection. 7/6; patient presents for one week followup. She has been using collagen to the wound bed. She states she is taking Levaquin however at times she is not able to keep it down. She denies signs of infection. 7/13; patient presents for 1 week follow-up. She has been using silver alginate to the wound bed. She still has nausea when taking her antibiotics. She denies signs of infection. 7/20; patient presents for 1 week follow-up. She has been using silver alginate with gentamicin cream to the wound bed. She denies any issues and has no complaints today. She denies signs of infection. 7/27; patient presents for 1 week follow-up. She continues to use silver alginate with  gentamicin cream to the wound bed. She reports starting her antibiotics. She has no issues or complaints. Overall she reports stability to the wound. 8/3; patient presents for 1 week follow-up. She has been using silver alginate with gentamicin cream to the wound bed. She reports completing all antibiotics. She has no issues or complaints today. She denies signs of infection. 8/17; patient presents for 2-week follow-up. He is to use silver alginate to the wound bed. She has no issues or complaints today. She denies signs of infection. She reports her pain has improved in her foot since last clinic visit 8/24; patient presents for 1 week follow-up. She continues to use silver alginate to the wound bed. She has no issues or complaints. She denies signs of infection. Pain is stable. 9/7; patient presents for follow-up. She missed her last week appointment due to feeling ill. She continues to use silver alginate. She has a new wound to the right lower extremity that is covered in eschar. She states It occurred over the past week and has no idea how it started. She currently denies signs of infection. 9/14; patient presents for follow-up. To the left foot wound she has been using gentamicin cream and  silver alginate. To the right lower extremity wound she has been keeping this covered and has not obtain Santyl. 9/21; patient presents for follow-up. She reports using gentamicin cream and silver alginate to the left foot and Santyl to the right lower extremity wound. She has no issues or complaints today. She denies signs of infection. 9/28; patient presents for follow-up. She reports a new wound to her right heel. She states this occurred a few days ago and is progressively gotten worse. She has been trying to clean the area with a Q-tip and Santyl. She reports stability in the other 2 wounds. She has been using gentamicin cream and silver alginate to the left foot and Santyl to the right lower extremity  wound. 10/12; patient presents for follow-up. She reports improvement to the wound beds. She is seeing vein and vascular to discuss the potential of a left BKA. She states they are going to do an arteriogram. She continues to use silver alginate with dressing changes to her wounds. 11/2; patient presents for follow-up. She states she has not been doing dressing changes to the wound beds. She states she is not able to offload the areas. She reports chronic pain to her left foot wound. 11/9; patient presents for follow-up. She came in with only socks on. She states she forgot to put on shoes. It is unclear if she is doing any dressing changes. She currently denies systemic signs of infection. 11/16; patient presents for follow-up. She came again only with socks on. She states she does not wear shoes ever. It is unclear if she does dressing changes. She currently denies systemic signs of infection. 11/23; patient presents for follow-up. She wore her shoes today. It still unclear exactly what dressing she is using for each wound but she did states she obtained Dakin's solution and has been using this to the left foot wound. She currently denies signs of infection. KIA, VARNADORE ([id number]) 11/30; patient presents for follow-up. She has no issues or complaints today. She currently denies signs of infection. 12/7; patient presents for follow-up. She has no issues or complaints today. She has been using Hydrofera Blue to the right heel wound and Dakin solution to the left foot wound. Her right anterior leg wound is healed. She currently denies signs of infection. Objective Constitutional Vitals Time Taken: 10:00 AM, Height: 69 in, Weight: 185 lbs, BMI: 27.3, Temperature: 98.5 F, Pulse: 108 bpm, Respiratory Rate: 16 breaths/min, Blood Pressure: 129/78 mmHg. General Notes: Left foot (Transmetatarsal amputation): Wound to the plantar aspect that has granulation tissue With scant nonviable tissue  and increased pockets of depth. There is circumferential callus. Right lower extremity: To the anterior aspect there is Epithelialization to the previous wound site. Right foot: To the heel there is an open wound with Granulation tissue and non viable tissue present. Circumferential callus. No obvious signs of infection to any wound. Integumentary (Hair, Skin) Wound #7 status is Open. Original cause of wound was Gradually Appeared. The date acquired was: 08/07/2020. The wound has been in treatment 27 weeks. The wound is located on the [place]. The wound measures 4.5cm length x 3.2cm width x 0.5cm depth; 11.31cm^2 area and 5.655cm^3 volume. There is Fat Layer (Subcutaneous Tissue) exposed. There is a medium amount of serosanguineous drainage noted. The wound margin is thickened. There is large (67-100%) pink, hyper - granulation within the wound bed. There is a small (1-33%) amount of necrotic tissue within the wound bed. General Notes: callus surrounding wound Wound #8  status is Open. Original cause of wound was Gradually Appeared. The date acquired was: 06/30/2021. The wound has been in treatment 13 weeks. The wound is located on the Right,Anterior Lower Leg. The wound measures 0cm length x 0cm width x 0cm depth; 0cm^2 area and 0cm^3 volume. There is a none present amount of drainage noted. There is no granulation within the wound bed. There is no necrotic tissue within the wound bed. Wound #9 status is Open. Original cause of wound was Gradually Appeared. The date acquired was: 07/18/2021. The wound has been in treatment 10 weeks. The wound is located on the Right Calcaneus. The wound measures 0.7cm length x 0.9cm width x 0.3cm depth; 0.495cm^2 area and 0.148cm^3 volume. There is Fat Layer (Subcutaneous Tissue) exposed. There is a medium amount of serosanguineous drainage noted. There is small (1-33%) pink granulation within the wound bed. There is a large (67-100%) amount of  necrotic tissue within the wound bed including Adherent Slough. 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 No signs of infection on exam. I debrided nonviable tissue. A silver nitrate to the hyper granulated areas. I recommended continuing Hydrofera Blue to the right heel wound and Dakin's to the left foot wound. Continue aggressive offloading and follow-up in 1 week. Procedures Wound #7 Pre-procedure diagnosis of Wound #7 is a Diabetic Wound/Ulcer of the Lower Extremity located on the [place] .Severity of Tissue Pre Debridement is: Fat layer exposed. There was a Excisional Skin/Subcutaneous Tissue Debridement with a total area of 14.4 sq cm performed Littlejohn, Tyriana A. ([id number]) by Kalman Shan, MD. With the following instrument(s): Curette to remove Viable and Non-Viable tissue/material. Material removed includes Callus, Subcutaneous Tissue, and Hyper-granulation. A time out was conducted at 10:20, prior to the start of the procedure. A Moderate amount of bleeding was controlled with Pressure. The procedure was tolerated well. Post Debridement Measurements: 4.5cm length x 3.2cm width x 0.5cm depth; 5.655cm^3 volume. Character of Wound/Ulcer Post Debridement is stable. Severity of Tissue Post Debridement is: Fat layer exposed. Post procedure Diagnosis Wound #7: Same as Pre-Procedure General Notes: silver nitrate to hyper granulation. Wound #9 Pre-procedure diagnosis of Wound #9 is a Diabetic Wound/Ulcer of the Lower Extremity located on the Right  Calcaneus .Severity of Tissue Pre Debridement is: Fat layer exposed. There was a Selective/Open Wound Skin/Epidermis Debridement with a total area of 0.63 sq cm performed by Kalman Shan, MD. With the following instrument(s): Curette to remove Viable and Non-Viable tissue/material. Material removed includes Callus, Skin: Dermis, and Skin: Epidermis. No specimens were taken. A time out was conducted at 10:25, prior to the start of the procedure. A Minimum amount of bleeding was controlled with Pressure. The procedure was tolerated well. Post Debridement Measurements: 0.7cm length x 0.9cm width x 0.3cm depth; 0.148cm^3 volume. Character of Wound/Ulcer Post Debridement is stable. Severity of Tissue Post Debridement is: Fat layer exposed. Post procedure Diagnosis Wound #9: Same as Pre-Procedure Plan Follow-up Appointments: Return Appointment in 1 week. Bathing/ Shower/ Hygiene: Wash wounds with antibacterial soap and water. Off-Loading: Open toe surgical shoe with peg assist. Turn and reposition every 2 hours Other: - peg  shoe right, off-loader left Medications-Please add to medication list.: P.O. Antibiotics WOUND #7: - Foot Wound Laterality: Plantar, Left, Midline Cleanser: Dakin 16 (oz) 0.25 1 x Per Day/30 Days Discharge Instructions: Use as directed. Primary Dressing: Gauze 1 x Per Day/30 Days Discharge Instructions: As directed: moistened with Dakins Solution Secured With: Kerlix Roll Sterile or Non-Sterile 6-ply 4.5x4 (yd/yd) 1 x Per Day/30 Days Discharge Instructions: Apply Kerlix as directed WOUND #9: - Calcaneus Wound Laterality: Right Cleanser: Soap and Water 1 x Per Day/30 Days Discharge Instructions: Gently cleanse wound with antibacterial soap, rinse and pat dry prior to dressing wounds Primary Dressing: Hydrofera Blue Ready Transfer Foam, 2.5x2.5 (in/in) 1 x Per Day/30 Days Discharge Instructions: Apply Hydrofera Blue Ready to wound bed as directed Secondary Dressing:  Bordered Gauze Sterile-HBD 4x4 (in/in) 1 x Per Day/30 Days Discharge Instructions: Cover wound with Bordered Guaze Sterile as directed Secured With: coverlet 1 x Per Day/30 Days 1. Hydrofera Blue 2. Dakin's solution 3. Aggressive offloading 4. In office sharp debridement and silver nitrate Electronic Signature(s) Signed: 09/29/2021 10:43:47 AM By: Kalman Shan DO Entered By: Kalman Shan on 09/29/2021 10:41:40 [name] ([id number]) -------------------------------------------------------------------------------- ROS/PFSH Details Patient Name: Heather Dear A. Date of Service: 09/29/2021 9:45 AM Medical Record Number: [id number] Patient Account Number: [id number] Date of Birth/Sex: [birth year] (45 y.o. F) Treating RN: Levora Dredge Primary Care Provider: Raelene Bott Other Clinician: Cornell Barman Referring Provider: Raelene Bott Treating Provider/Extender: Yaakov Guthrie in Treatment: 27 Information Obtained From Patient Ear/Nose/Mouth/Throat Medical History: Positive for: Chronic sinus problems/congestion; Middle ear problems Hematologic/Lymphatic Medical History: Positive for: Anemia Respiratory Medical History: Positive for: Chronic Obstructive Pulmonary Disease (COPD) Cardiovascular Medical History: Positive for: Congestive Heart Failure - EF 33% Endocrine Medical History: Positive for: Type II Diabetes Time with diabetes: 14 years Treated with: Insulin, Oral agents Blood sugar tested every day: No Blood sugar testing results: Bedtime: 176 Genitourinary Medical History: Positive for: End Stage Renal Disease Integumentary (Skin) Medical History: Positive for: History of pressure wounds Neurologic Medical History: Positive for: Neuropathy - feel and legs HBO Extended History Items Ear/Nose/Mouth/Throat: Ear/Nose/Mouth/Throat: Chronic sinus problems/congestion Middle ear problems Immunizations Pneumococcal Vaccine: Received  Pneumococcal Vaccination: No Couser, Camelia A. ([id number]) Implantable Devices No devices added Family and Social History Current every day smoker; Marital Status - Single; Alcohol Use: Never; Drug Use: Prior History; Caffeine Use: Daily Electronic Signature(s) Signed: 09/29/2021 10:43:47 AM By: Kalman Shan DO Signed: 10/04/2021 11:01:31 AM By: Levora Dredge Entered By: Kalman Shan on 09/29/2021 10:43:00 [name] ([id number]) -------------------------------------------------------------------------------- SuperBill Details Patient Name: Heather Dear A. Date of Service: 09/29/2021 Medical Record Number: [id number] Patient Account Number: [id number] Date of Birth/Sex: 1976-03-29 (45 y.o. F) Treating RN: Levora Dredge Primary Care Provider: Raelene Bott Other Clinician: Cornell Barman Referring Provider: Raelene Bott Treating Provider/Extender: Yaakov Guthrie in Treatment: 27 Diagnosis Coding ICD-10 Codes Code Description [phone] 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ity L97.512 Non-pressure chronic ulcer of other part of right foot with fat layer exposed L97.812 Non-pressure chronic ulcer of other part of right lower leg with fat layer exposed E11.621 Type 2 diabetes mellitus with foot ulcer E11.42 Type 2 diabetes mellitus with diabetic polyneuropathy Facility Procedures CPT4 Code: 40973532 Description: 11042 - DEB SUBQ TISSUE 20 SQ CM/< Modifier: Quantity: 1 CPT4 Code: Description: ICD-10 Diagnosis Description 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 Modifier: severity Quantity: CPT4 Code: [id number] Description: 41962 - DEBRIDE WOUND 1ST 20 SQ CM  OR < Modifier: Quantity: 1 CPT4 Code: Description: ICD-10 Diagnosis Description L97.512 Non-pressure chronic ulcer of other part of right foot with fat layer expos Modifier: ed Quantity: Physician Procedures CPT4  Code: [id number] Description: 11042 - WC PHYS SUBQ TISS 20 SQ CM Modifier: Quantity: 1 CPT4 Code: Description: ICD-10 Diagnosis Description 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 Modifier: severity Quantity: CPT4 Code: [id number] Description: 83014 - WC PHYS DEBR WO ANESTH 20 SQ CM Modifier: Quantity: 1 CPT4 Code: Description: ICD-10 Diagnosis Description L97.512 Non-pressure chronic ulcer of other part of right foot with fat layer expos Modifier: ed Quantity: Electronic Signature(s) Signed: 09/29/2021 10:43:47 AM By: Kalman Shan DO Entered By: Kalman Shan on 09/29/2021 10:42:09

## 2021-10-06 ENCOUNTER — Other Ambulatory Visit: Payer: Self-pay

## 2021-10-06 ENCOUNTER — Encounter (HOSPITAL_BASED_OUTPATIENT_CLINIC_OR_DEPARTMENT_OTHER): Payer: Medicaid Other | Admitting: Internal Medicine

## 2021-10-06 DIAGNOSIS — L97512 Non-pressure chronic ulcer of other part of right foot with fat layer exposed: Secondary | ICD-10-CM | POA: Diagnosis not present

## 2021-10-06 DIAGNOSIS — L97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ity: Secondary | ICD-10-CM | POA: Diagnosis not present

## 2021-10-06 DIAGNOSIS — E1142 Type 2 diabetes mellitus with diabetic polyneuropathy: Secondary | ICD-10-CM

## 2021-10-06 DIAGNOSIS — E11621 Type 2 diabetes mellitus with foot ulcer: Secondary | ICD-10-CM | POA: Diagnosis not present

## 2021-10-06 NOTE — Progress Notes (Signed)
[name], [name] ([id number]) Visit Report for 10/06/2021 Arrival Information Details Patient Name: [name], [name] Date of Service: 10/06/2021 10:30 AM Medical Record Number: [id number] Patient Account Number: [id number] Date of Birth/Sex: 11-02-1975 (45 y.o. F) Treating RN: Levora Dredge Primary Care Santosha Jividen: Raelene Bott Other Clinician: Referring Avis Mcmahill: Raelene Bott Treating Yazmine Sorey/Extender: Yaakov Guthrie in Treatment: 28 Visit Information History Since Last Visit Added or deleted any medications: No Patient Arrived: Wheel Chair Any new allergies or adverse reactions: No Arrival Time: 11:08 Had a fall or experienced change in No Accompanied By: mother activities of daily living that may affect Transfer Assistance: EasyPivot Patient Lift risk of falls: Patient Identification Verified: Yes Hospitalized since last visit: No Secondary Verification Process Completed: Yes Has Dressing in Place as Prescribed: Yes Patient Requires Transmission-Based No Pain Present Now: Yes Precautions: Patient Has Alerts: Yes Patient Alerts: Patient on Blood Thinner 86m apirin Diabetic Type II Electronic Signature(s) Signed: 10/06/2021 4:05:40 PM By: GLevora DredgeEntered By: GLevora Dredgeon 10/06/2021 11:08:29 SLonzo [name]([id number] -------------------------------------------------------------------------------- Clinic Level of Care Assessment Details Patient Name: [name] Date of Service: 10/06/2021 10:30 AM Medical Record Number: [id number] Account Number: [id number] of Birth/Sex: [birth year](45y.o. F) Treating RN: GLevora DredgePrimary Care Osha Errico: HRaelene BottOther Clinician: Referring Jamirah Zelaya: HRaelene BottTreating Olimpia Tinch/Extender: HYaakov Guthriein Treatment: 28 Clinic Level of Care Assessment Items TOOL 4 Quantity Score X - Use when only an EandM is performed on FOLLOW-UP visit 1 0 ASSESSMENTS -  Nursing Assessment / Reassessment []  - Reassessment of Co-morbidities (includes updates in patient status) 0 []  - 0 Reassessment of Adherence to Treatment Plan ASSESSMENTS - Wound and Skin Assessment / Reassessment []  - Simple Wound Assessment / Reassessment - one wound 0 X- 2 5 Complex Wound Assessment / Reassessment - multiple wounds []  - 0 Dermatologic / Skin Assessment (not related to wound area) ASSESSMENTS - Focused Assessment []  - Circumferential Edema Measurements - multi extremities 0 []  - 0 Nutritional Assessment / Counseling / Intervention []  - 0 Lower Extremity Assessment (monofilament, tuning fork, pulses) []  - 0 Peripheral Arterial Disease Assessment (using hand held doppler) ASSESSMENTS - Ostomy and/or Continence Assessment and Care []  - Incontinence Assessment and Management 0 []  - 0 Ostomy Care Assessment and Management (repouching, etc.) PROCESS - Coordination of Care X - Simple Patient / Family Education for ongoing care 1 15 []  - 0 Complex (extensive) Patient / Family Education for ongoing care []  - 0 Staff obtains CProgrammer, systems Records, Test Results / Process Orders []  - 0 Staff telephones HHA, Nursing Homes / Clarify orders / etc []  - 0 Routine Transfer to another Facility (non-emergent condition) []  - 0 Routine Hospital Admission (non-emergent condition) []  - 0 New Admissions / IBiomedical engineer/ Ordering NPWT, Apligraf, etc. []  - 0 Emergency Hospital Admission (emergent condition) X- 1 10 Simple Discharge Coordination []  - 0 Complex (extensive) Discharge Coordination PROCESS - Special Needs []  - Pediatric / Minor Patient Management 0 []  - 0 Isolation Patient Management []  - 0 Hearing / Language / Visual special needs []  - 0 Assessment of Community assistance (transportation, D/C planning, etc.) []  - 0 Additional assistance / Altered mentation []  - 0 Support Surface(s) Assessment (bed, cushion, seat, etc.) INTERVENTIONS - Wound  Cleansing / Measurement Novosel, Dona A. ([id number] []  - 0 Simple Wound Cleansing - one wound X- 2 5 Complex Wound Cleansing - multiple wounds X- 1 5 Wound Imaging (photographs - any number of wounds) []  - 0  Wound Tracing (instead of photographs) []  - 0 Simple Wound Measurement - one wound X- 2 5 Complex Wound Measurement - multiple wounds INTERVENTIONS - Wound Dressings []  - Small Wound Dressing one or multiple wounds 0 X- 2 15 Medium Wound Dressing one or multiple wounds []  - 0 Large Wound Dressing one or multiple wounds []  - 0 Application of Medications - topical []  - 0 Application of Medications - injection INTERVENTIONS - Miscellaneous []  - External ear exam 0 []  - 0 Specimen Collection (cultures, biopsies, blood, body fluids, etc.) []  - 0 Specimen(s) / Culture(s) sent or taken to Lab for analysis []  - 0 Patient Transfer (multiple staff / Civil Service fast streamer / Similar devices) []  - 0 Simple Staple / Suture removal (25 or less) []  - 0 Complex Staple / Suture removal (26 or more) []  - 0 Hypo / Hyperglycemic Management (close monitor of Blood Glucose) []  - 0 Ankle / Brachial Index (ABI) - do not check if billed separately X- 1 5 Vital Signs Has the patient been seen at the hospital within the last three years: Yes Total Score: 95 Level Of Care: New/Established - Level 3 Electronic Signature(s) Signed: 10/06/2021 4:05:40 PM By: Levora Dredge Entered By: Levora Dredge on 10/06/2021 11:46:24 [name] ([id number]) -------------------------------------------------------------------------------- Encounter Discharge Information Details Patient Name: Heather Dear A. Date of Service: 10/06/2021 10:30 AM Medical Record Number: [id number] Patient Account Number: [id number] Date of Birth/Sex: 04/19/1976 (45 y.o. F) Treating RN: Levora Dredge Primary Care Tacy Chavis: Raelene Bott Other Clinician: Referring Nyjah Denio: Raelene Bott Treating Axel Meas/Extender:  Yaakov Guthrie in Treatment: 45 Encounter Discharge Information Items Discharge Condition: Stable Ambulatory Status: Wheelchair Discharge Destination: Home Transportation: Private Auto Accompanied By: mother Schedule Follow-up Appointment: Yes Clinical Summary of Care: Patient Declined Electronic Signature(s) Signed: 10/06/2021 4:05:40 PM By: Levora Dredge Entered By: Levora Dredge on 10/06/2021 11:47:42 [name], [name] ([id number]) -------------------------------------------------------------------------------- Lower Extremity Assessment Details Patient Name: Heather Dear A. Date of Service: 10/06/2021 10:30 AM Medical Record Number: [id number] Patient Account Number: [id number] Date of Birth/Sex: 01-Jan-1976 (45 y.o. F) Treating RN: Levora Dredge Primary Care Marcel Gary: Raelene Bott Other Clinician: Referring Mishika Flippen: Raelene Bott Treating Agnes Brightbill/Extender: Yaakov Guthrie in Treatment: 28 Edema Assessment Assessed: [Left: No] [Right: No] Edema: [Left: No] [Right: No] Vascular Assessment Pulses: Dorsalis Pedis Palpable: [Left:Yes] [Right:Yes] Electronic Signature(s) Signed: 10/06/2021 4:05:40 PM By: Levora Dredge Entered By: Levora Dredge on 10/06/2021 11:25:42 [name], [name] ([id number]) -------------------------------------------------------------------------------- Multi Wound Chart Details Patient Name: Heather Dear A. Date of Service: 10/06/2021 10:30 AM Medical Record Number: [id number] Patient Account Number: [id number] Date of Birth/Sex: January 07, 1976 (45 y.o. 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 Extremity Comorbid History: Chronic sinus problems/congestion, Chronic sinus problems/congestion, N/A Middle ear problems, Anemia, Middle ear problems, Anemia, Chronic Obstructive Pulmonary Chronic Obstructive Pulmonary Disease (COPD), Congestive Heart Disease (COPD), Congestive Heart Failure, Type II Diabetes, End Stage Failure, Type II Diabetes, End Stage Renal Disease, History of pressure Renal Disease, History of pressure wounds, Neuropathy wounds, Neuropathy Date Acquired: 08/07/2020 07/18/2021 N/A Weeks of Treatment: 28 11 N/A Wound Status: Open Open N/A Measurements L x W  x D (cm) 4.3x3x0.4 0.6x0.9x0.3 N/A Area (cm) : 10.132 0.424 N/A Volume (cm) : 4.053 0.127 N/A % Reduction in Area: -279.50% -28.50% N/A % Reduction in Volume: -203.60% 45.00% N/A Classification: Grade 2 Grade 1 N/A Exudate Amount: Large Medium N/A Exudate Type: Serosanguineous Serosanguineous N/A Exudate Color: red, brown red, brown N/A Wound Margin: Thickened N/A N/A Granulation Amount: Large (67-100%) Small (1-33%) N/A Granulation Quality: Pink, Hyper-granulation Pink N/A Necrotic Amount: Small (1-33%) Large (67-100%) N/A Exposed Structures: Fat Layer (Subcutaneous Tissue): Fat Layer (Subcutaneous Tissue): N/A Yes Yes Fascia: No Fascia: No Tendon: No Tendon: No Muscle: No Muscle: No Joint: No Joint: No Bone: No Bone: No Epithelialization: None None N/A Treatment Notes Electronic Signature(s) Signed: 10/06/2021 11:54:50 AM By: Owens Shark, Falon A. ([id number]) Entered By: Kalman Shan on 10/06/2021 11:37:23 [name] ([id number]) -------------------------------------------------------------------------------- [place]  Details Patient Name: Heather Dear A. Date of Service: 10/06/2021 10:30 AM Medical Record Number: [id number] Patient Account Number: [id number] Date of Birth/Sex: May 15, 1976 (45 y.o. F) Treating RN: Levora Dredge Primary Care Kyo Cocuzza: Raelene Bott Other Clinician: Referring Sorayah Schrodt: Raelene Bott Treating Jovonni Borquez/Extender: Yaakov Guthrie in Treatment: 28 Active Inactive Necrotic Tissue Nursing Diagnoses: Impaired tissue integrity related to necrotic/devitalized tissue Knowledge deficit related to management of necrotic/devitalized tissue Goals: Necrotic/devitalized tissue will be minimized in the wound bed Date Initiated: 04/07/2021 Target Resolution Date: 10/08/2021 Goal Status: Active Patient/caregiver will verbalize understanding of reason and process for debridement of necrotic tissue Date Initiated: 04/07/2021 Date Inactivated: 09/22/2021 Target Resolution Date: 09/08/2021 Goal Status: Met Interventions: Assess patient pain level pre-, during and post procedure and prior to discharge Provide education on necrotic tissue and debridement process Treatment Activities: Excisional debridement : 04/07/2021 Notes: Electronic Signature(s) Signed: 10/06/2021 4:05:40 PM By: Levora Dredge Entered By: Levora Dredge on 10/06/2021 11:31:00 Heather Dear A. ([id number]) -------------------------------------------------------------------------------- Pain Assessment Details Patient Name: Heather Dear A. Date of Service: 10/06/2021 10:30 AM Medical Record Number: [id number] Patient Account Number: [id number] Date of Birth/Sex: May 09, 1976 (45 y.o. F) Treating RN: Levora Dredge Primary Care Avah Bashor: Raelene Bott Other Clinician: Referring Jayleah Garbers: Raelene Bott Treating Demontrae Gilbert/Extender: Yaakov Guthrie in Treatment: 28 Active Problems Location of Pain Severity and Description of Pain Patient Has Paino Yes Site Locations Rate the pain. Current Pain  Level: 8 Pain Management and Medication Current Pain Management: Notes pt states pain in bilateral feet Electronic Signature(s) Signed: 10/06/2021 4:05:40 PM By: Levora Dredge Entered By: Levora Dredge on 10/06/2021 11:11:20 [name] ([id number]) -------------------------------------------------------------------------------- Patient/Caregiver Education Details Patient Name: Heather Dear A. Date of Service: 10/06/2021 10:30 AM Medical Record Number: [id number] Patient Account Number: [id number] Date of Birth/Gender: 10-16-1976 (45 y.o. F) Treating RN: Levora Dredge Primary Care Physician: Raelene Bott Other Clinician: Referring Physician: Raelene Bott Treating Physician/Extender: Yaakov Guthrie in Treatment: 46 Education Assessment Education Provided To: Patient and Caregiver Education Topics Provided Wound/Skin Impairment: Handouts: Caring for Your Ulcer Methods: Explain/Verbal Responses: State content correctly Electronic Signature(s) Signed: 10/06/2021 4:05:40 PM By: Levora Dredge Entered By: Levora Dredge on 10/06/2021 11:46:53 [name], [name] ([id number]) -------------------------------------------------------------------------------- Wound Assessment Details Patient Name: Heather Dear A. Date of Service: 10/06/2021 10:30 AM Medical Record Number: [id number] Patient Account Number: [id number] Date of Birth/Sex: [birth year] (45 y.o. F) Treating RN: Levora Dredge Primary Care Adilee Lemme: Raelene Bott Other Clinician: Referring Juliany Daughety: Raelene Bott Treating Kattaleya Alia/Extender: Yaakov Guthrie in Treatment: 28 Wound Status Wound Number: 7 Primary Diabetic Wound/Ulcer of the Lower Extremity Etiology: Wound Location: Left, Midline, Plantar Foot Wound Open Wounding Event: Gradually  Appeared Status: Date Acquired: 08/07/2020 Comorbid Chronic sinus problems/congestion, Middle ear problems, Weeks Of Treatment: 28 History:  Anemia, Chronic Obstructive Pulmonary Disease (COPD), Clustered Wound: No Congestive Heart Failure, Type II Diabetes, End Stage Renal Disease, History of pressure wounds, Neuropathy Photos Wound Measurements Length: (cm) 4.3 Width: (cm) 3 Depth: (cm) 0.4 Area: (cm) 10.132 Volume: (cm) 4.053 % Reduction in Area: -279.5% % Reduction in Volume: -203.6% Epithelialization: None Tunneling: No Undermining: No Wound Description Classification: Grade 2 Fo Wound Margin: Thickened Sl Exudate Amount: Large Exudate Type: Serosanguineous Exudate Color: red, brown ul Odor After Cleansing: No ough/Fibrino Yes Wound Bed Granulation Amount: Large (67-100%) Exposed Structure Granulation Quality: Pink, Hyper-granulation Fascia Exposed: No Necrotic Amount: Small (1-33%) Fat Layer (Subcutaneous Tissue) Exposed: Yes Necrotic Quality: Adherent Slough Tendon Exposed: No Muscle Exposed: No Joint Exposed: No Bone Exposed: No Treatment Notes Wound #7 (Foot) Wound Laterality: Plantar, Left, Midline Cleanser Dakin 16 (oz) 0.25 Discharge Instruction: Use as directed. [name], [name] ([id number]) Peri-Wound Care Topical Primary Dressing Gauze Discharge Instruction: As directed: moistened with Dakins Solution Secondary Dressing Secured With Kerlix Roll Sterile or Non-Sterile 6-ply 4.5x4 (yd/yd) Discharge Instruction: Apply Kerlix as directed Compression Wrap Compression Stockings Add-Ons Electronic Signature(s) Signed: 10/06/2021 4:05:40 PM By: Levora Dredge Entered By: Levora Dredge on 10/06/2021 11:23:08 Heather Dear A. ([id number]) -------------------------------------------------------------------------------- Wound Assessment Details Patient Name: Heather Dear A. Date of Service: 10/06/2021 10:30 AM Medical Record Number: [id number] Patient Account Number: [id number] Date of Birth/Sex: [birth year] (45 y.o. F) Treating RN: Levora Dredge Primary Care Harless Molinari: Raelene Bott Other Clinician: Referring Suleyman Ehrman: Raelene Bott Treating Trease Bremner/Extender: Yaakov Guthrie in Treatment: 28 Wound Status Wound Number: 9 Primary Diabetic Wound/Ulcer of the Lower Extremity Etiology: Wound Location: Right Calcaneus Wound Open Wounding Event: Gradually Appeared Status: Date Acquired: 07/18/2021 Comorbid Chronic sinus problems/congestion, Middle ear problems, Weeks Of Treatment: 11 History: Anemia, Chronic Obstructive Pulmonary Disease (COPD), Clustered Wound: No Congestive Heart Failure, Type II Diabetes, End Stage Renal Disease, History of pressure wounds, Neuropathy Photos Wound Measurements Length: (cm) 0.6 Width: (cm) 0.9 Depth: (cm) 0.3 Area: (cm) 0.424 Volume: (cm) 0.127 % Reduction in Area: -28.5% % Reduction in Volume: 45% Epithelialization: None Wound Description Classification: Grade 1 Exudate Amount: Medium Exudate Type: Serosanguineous Exudate Color: red, brown Foul Odor After Cleansing: No Slough/Fibrino Yes Wound Bed Granulation Amount: Small (1-33%) Exposed Structure Granulation Quality: Pink Fascia Exposed: No Necrotic Amount: Large (67-100%) Fat Layer (Subcutaneous Tissue) Exposed: Yes Necrotic Quality: Adherent Slough Tendon Exposed: No Muscle Exposed: No Joint Exposed: No Bone Exposed: No Treatment Notes Wound #9 (Calcaneus) Wound Laterality: Right Cleanser Soap and Water Discharge Instruction: Gently cleanse wound with antibacterial soap, rinse and pat dry prior to dressing wounds Peri-Wound Care Brozek, Eshani A. ([id number]) Topical Primary Dressing Hydrofera Blue Ready Transfer Foam, 2.5x2.5 (in/in) Discharge Instruction: Apply Hydrofera Blue Ready to wound bed as directed Secondary Dressing Bordered Gauze Sterile-HBD 4x4 (in/in) Discharge Instruction: Cover wound with Bordered Guaze Sterile as directed Secured With coverlet Compression Wrap Compression Stockings Add-Ons Electronic  Signature(s) Signed: 10/06/2021 4:05:40 PM By: Levora Dredge Entered By: Levora Dredge on 10/06/2021 11:24:43 Masters, Lucinda A. ([id number]) -------------------------------------------------------------------------------- Vitals Details Patient Name: Heather Dear A. Date of Service: 10/06/2021 10:30 AM Medical Record Number: [id number] Patient Account Number: [id number] Date of Birth/Sex: 05-22-1976 (45 y.o. F) Treating RN: Levora Dredge Primary Care Jhostin Epps: Raelene Bott Other Clinician: Referring Flora Ratz: Raelene Bott Treating Lamon Rotundo/Extender: Yaakov Guthrie in Treatment: 28 Vital Signs Time Taken: 11:08 Temperature (F): 98.2 Height (in): 69 Pulse (bpm):  76 Weight (lbs): 185 Respiratory Rate (breaths/min): 18 Body Mass Index (BMI): 27.3 Blood Pressure (mmHg): 108/75 Reference Range: 80 - 120 mg / dl Electronic Signature(s) Signed: 10/06/2021 4:05:40 PM By: Levora Dredge Entered By: Levora Dredge on 10/06/2021 11:10:57

## 2021-10-06 NOTE — Progress Notes (Signed)
ELAIJAH, MUNOZ ([id number]) Visit Report for 10/06/2021 Chief Complaint Document Details Patient Name: [name], OPENSHAW A. Date of Service: 10/06/2021 10:30 AM Medical Record Number: [id number] Patient Account Number: [id number] Date of Birth/Sex: 1976-07-16 (45 y.o. F) Treating RN: Levora Dredge Primary Care Provider: Raelene Bott Other Clinician: Referring Provider: Raelene Bott Treating Provider/Extender: Yaakov Guthrie in Treatment: 28 Information Obtained from: Patient Chief Complaint 03/19/2021; patient referred by Dr. Luana Shu who has been looking after her left foot for quite a period of time for review of a nonhealing area in the left midfoot Electronic Signature(s) Signed: 10/06/2021 11:54:50 AM By: Kalman Shan DO Entered By: Kalman Shan on 10/06/2021 11:34:42 Holton, Ynez A. ([id number]) -------------------------------------------------------------------------------- HPI Details Patient Name: [name] A. Date of Service: 10/06/2021 10:30 AM Medical Record Number: [id number] Patient Account Number: [id number] Date of Birth/Sex: 1976/02/13 (45 y.o. F) Treating RN: Levora Dredge Primary Care Provider: Raelene Bott Other Clinician: Referring Provider: Raelene Bott Treating Provider/Extender: Yaakov Guthrie in Treatment: 28 History of Present Illness HPI Description: 01/18/18-She is here for initial evaluation of the left great toe ulcer. She is a poor historian in regards to timeframe in detail. She states approximately 4 weeks ago she lacerated her toe on something in the house. She followed up with her primary care who placed her on Bactrim and ultimately a second dose of Bactrim prior to coming to wound clinic. She states she has been treating the toe with peroxide, Betadine and a Band-Aid. She did not check her blood sugar this morning but checked it yesterday morning it was 327; she is unaware of a recent A1c and there are no current  records. She saw Dr. she would've orthopedics last week for an old injury to the left ankle, she states he did not see her toe, nor did she bring it to his attention. She smokes approximately 1 pack cigarettes a day. Her social situation is concerning, she arrives this morning with her mother who appears extremely intoxicated/under the influence; her mother was asked to leave the room and be monitored by the patient's grandmother. The patient's aunt then accompanied the patient and the room throughout the rest of the appointment. We had a lengthy discussion regarding the deleterious effects of uncontrolled hyperglycemia and smoking as it relates to wound healing and overall health. She was strongly encouraged to decrease her smoking and get her diabetes under better control. She states she is currently on a diet and has cut down her [facility] consumption. The left toe is erythematous, macerated and slightly edematous with malodor present. The edema in her left foot is below her baseline, there is no erythema streaking. We will treat her with Santyl, doxycycline; we have ordered and xray, culture and provided a Peg assist surgical shoe and cultured the wound. 01/25/18-She is here in follow-up evaluation for a left great toe ulcer and presents with an abscess to her suprapubic area. She states her blood sugars remain elevated, feeling "sick" and if levels are below 250, but she is trying. She has made no attempt to decrease her smoking stating that we "can't take away her food in her cigarettes". She has been compliant with offloading using the PEG assist you. She is using Santyl daily. the culture obtained last week grew staph aureus and Enterococcus faecalis; continues on the doxycycline and Augmentin was added on Monday. The suprapubic area has erythema, no femoral variation, purple discoloration, minimal induration, was accessed with a cotton tip applicator with sanguinopurulent drainage, this was  cultured, I suspect the current antibiotic treatment will cover and we will not add anything to her current treatment plan. She was advised to go to urgent care or ER with any change in redness, induration or fever. 02/01/18-She is here in follow-up evaluation for left great toe ulcers and a new abdominal abscess from last week. She was able to use packing until earlier this week, where she "forgot it was there". She states she was feeling ill with GI symptoms last week and was not taking her antibiotic. She states her glucose levels have been predominantly less than 200, with occasional levels between 200-250. She thinks this was contributing to her GI symptoms as they have resolved without intervention. There continues to be significant laceration to left toe, otherwise it clinically looks stable/improved. There is now less superficial opening to the lateral aspect of the great toe that was residual blister. We will transition to [facility] to all wounds, she will continue her Augmentin. If there is no change or deterioration next week for reculture. 02/08/18-She is here in follow-up evaluation for left great toe ulcer and abdominal ulcer. There is an improvement in both wounds. She has been wrapping her left toe with coban, not by our direction, which has created an area of discoloration to the medial aspect; she has been advised to NOT use coban secondary to her neuropathy. She states her glucose levels have been high over this last week ranging from 200-350, she continues to smoke. She admits to being less compliant with her offloading shoe. We will continue with same treatment plan and she will follow-up next week. 02/15/18-She is here in follow-up evaluation for left great toe ulcer and abdominal ulcer. The abdominal ulcer is epithelialized. The left great toe ulcer is improved and all injury from last week using the Coban wrap is resolved, the lateral ulcer is healed. She admits to  noncompliance with wearing offloading shoe and admits to glucose levels being greater than 300 most of the week. She continues to smoke and expresses no desire to quit. There is one area medially that probes deeper than it has historically, erythema to the toe and dorsal foot has consistently waxed and waned. There is no overt signs of cellulitis or infection but we will culture the wound for any occult infection given the new area of depth and erythema. We will hold off on sensitivities for initiation of antibiotic therapy. 02/22/18-She is here in follow up evaluation for left great toe ulcer. There is overall significant improvement in both wound appearance, erythema and edema with changes made last week. She was not initiated on antibiotic therapy. Culture obtained last week showed oxacillin sensitive staph aureus, sensitive to clindamycin. Clindamycin has been called into the pharmacy but she has been instructed to hold off on initiation secondary to overall clinical improvement and her history of antibiotic intolerance. She has been instructed to contact the clinic with any noted changes/deterioration and the wound, erythema, edema and/or pain. She will follow-up next week. She continues to smoke and her glucose levels remain elevated >250; she admits to compliance with offloading shoe 03/01/18 on evaluation today patient appears to be doing fairly well in regard to her left first toe ulcer. She has been tolerating the dressing changes with the [facility] Dressing without complication and overall this has definitely showed signs of improvement according to records as well is what the patient tells me today. I'm very pleased in that regard. She is having no pain today 03/08/18 She  is here for follow up evaluation of a left great toe ulcer. She remains non-compliant with glucose control and smoking cessation; glucose levels consistently >200. She states that she got new shoe inserts/peg assist. She  admits to compliance with offloading. Since my last evaluation there is significant improvement. We will switch to prisma at this time and she will follow up next week. She is noted to be tachycardic at this appointment, heart rate 120s; she has a history of heart rate 70-130 according to our records. She admits to extreme agitation r/t personal issues; she was advised to monitor her heartrate and contact her physician if it does not return to a more normal range (<100). She takes cardizem twice daily. 03/15/18-She is here in follow-up evaluation for left great toe ulcer. She remains noncompliant with glucose control and smoking cessation. She admits to compliance with wearing offloading shoe. The ulcer is improved/stable and we will continue with the same treatment plan and she will follow-up next week 03/22/18-She is here for evaluation for left great toe ulcer. There continues to be significant improvement despite recurrent hyperglycemia (over 500 yesterday) and she continues to smoke. She has been compliant with offloading and we will continue with same treatment plan and she will follow-up next week. 03/29/18-She is here for evaluation for left great toe ulcer. Despite continuing to smoke and uncontrolled diabetes she continues to improve. She is compliant with offloading shoe. We will continue with the same treatment plan and she will follow-up next week 04/05/18- She is here in follow up evaluation for a left great toe ulcer; she presents with small pustule to left fifth toe (resembles ant bite). She admits to compliance with wearing offloading shoe; continues to smoke or have uncontrolled blood glucose control. There is more callus than usual with evidence of bleeding; she denies known trauma. 04/12/18-She is here for evaluation of left great toe ulcer. Despite noncompliance with glycemic control and smoking she continues to make Trentman, Evonda A. ([id number]) improvement. She continues to wear  offloading shoe. The pustule, that was identified last week, to the left fifth toe is resolved. She will follow-up in 2 weeks 05/03/18-she is seen in follow-up evaluation for a left great toe ulcer. She is compliant with offloading, otherwise noncompliant with glycemic control and smoking. She has plateaued and there is minimal improvement noted. We will transition to [facility], replaced the insert to her surgical shoe and she will follow-up in one week 05/10/18- She is here in follow up evaluation for a left great toe ulcer. It appears stable despite measurement change. We will continue with same treatment plan and follow up next week. 05/24/18-She is seen in follow-up evaluation for a left great toe ulcer. She remains compliant with offloading, has made significant improvement in her diet, decreasing the amount of sugar/soda. She said her recent A1c was 10.9 which is lower than. She did see a diabetic nutritionist/educator yesterday. She continues to smoke. We will continue with the same treatment plan and she'll follow-up next week. 05/31/18- She is seen in follow-up evaluation for left great toe ulcer. She continues to remain compliant with offloading, continues to make improvement in her diet, increasing her water and decreasing the amount of sugar/soda. She does continue to smoke with no desire to quit. We will apply Prisma to the depth and Hydrofera Blue over. We have not received insurance authorization for oasis. She will follow up next week. 06/07/18-She is seen in follow-up evaluation for left great toe ulcer. It has  stalled according to today's measurements although base appears stable. She says she saw a diabetic educator yesterday; her average blood sugars are less than 300 which is an improvement for her. She continues to smoke and states "that's my next step" She continues with water over soda. We will order for xray, culture and reinstate ace wrap compression prior to placing apligraf  for next week. She is voicing no complaints or concerns. Her dressing will change to iodoflex over the next week in preparation for apligraf. 06/14/18-She is seen in follow-up evaluation for left great toe ulcer. Plain film x-ray performed last week was negative for osteomyelitis. Wound culture obtained last week grew strep B and OSSA; she is initiated on keflex and cefdinir today; there is erythema to the toe which could be from ace wrap compression, she has a history of wrapping too tight and has has been encouraged to maintain ace wraps that we place today. We will hold off on application of apligraf today, will apply next week after antibiotic therapy has been initiated. She admits today that she has resumed taking a shower with her foot/toe submerged in water, she has been reminded to keep foot/toe out of the bath water. She will be seen in follow up next week 06/21/18-she is seen in follow-up evaluation for left great toe ulcer. She is tolerating antibiotic therapy with no GI disturbance. The wound is stable. Apligraf was applied today. She has been decreasing her smoking, only had 4 cigarettes yesterday and 1 today. She continues being more compliant in diabetic diet. She will follow-up next week for evaluation of site, if stable will remove at 2 weeks. 06/28/18- She is here in follow up evalution. Apligraf was placed last week, she states the dressing fell off on Tuesday and she was dressing with hydrofera blue. She is healed and will be discharged from the clinic today. She has been instructed to continue with smoking cessation, continue monitoring glucose levels, offloading for an additional 4 weeks and continue with hydrofera blue for additional two weeks for any possible microscopic opening. Readmission: 08/07/18 on evaluation today patient presents for reevaluation concerning the ulcer of her right great toe. She was previously discharged on 06/28/18 healed. Nonetheless she states that this  began to show signs of drainage she subsequently went to her primary care provider. Subsequently an x-ray was performed on 08/01/18 which was negative. The patient was also placed on antibiotics at that time. Fortunately they should have been effective for the infection. Nonetheless she's been experiencing some improvement but still has a lot of drainage coming from the wound itself. 08/14/18 on evaluation today patient's wound actually does show signs of improvement in regard to the erythema at this point. She has completed the antibiotics. With that being said we did discuss the possibility of placing her in a total contact cast as of today although I think that I may want to give this just a little bit more time to ensure nothing recurrence as far as her infection is concerned. I do not want to put in the cast and risk infection at that time if things are not completely resolved. With that being said she is gonna require some debridement today. 08/21/18 on evaluation today patient actually appears to be doing okay in regard to her toe ulcer. She's been tolerating the dressing changes without complication. With that being said it does appear that she is ready and in fact I think it's appropriate for [place] to go ahead and initiate the  total contact cast today. Nonetheless she will require some sharp debridement to prepare the wound for application. Overall I feel like things have been progressing well but we do need to do something to get this to close more readily. 08/24/18 patient seen today for reevaluation after having had the total contact cast applied on Tuesday. She seems to have done very well the wound appears to be doing great and overall I'm pleased with the progress that she's made. There were no abnormal areas of rubbing from the cast on her lower extremity. 08/30/18 on evaluation today patient actually appears to be completely healed in regard to her plantar toe ulcer. She tells me at this  point she's been having a lot of issues with the cast. She almost fell a couple of times the state shall the step of her dog a couple times as well. This is been a very frustrating process for her other nonetheless she has completely healed the wound which is excellent news. Overall there does not appear to be the evidence of infection at this time which is great news. 09/11/18 evaluation today patient presents for follow-up concerning her great toe ulcer on the left which has unfortunately reopened since I last saw her which was only a couple of weeks ago. Unfortunately she was not able to get in to get the shoe and potentially the AFO that's gonna be necessary due to her left foot drop. She continues with offloading shoe but this is not enough to prevent her from reopening it appears. When we last had her in the total contact cast she did well from a healing standpoint but unfortunately the wound reopened as soon as she came out of the cast within just a couple of weeks. 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 This is also part of the reason why she falls on a regular basis. I just do not believe that is gonna be safe for [place] to reinitiate the total contact cast as last time we had this on she fell 3 times one week which is definitely not normal for her. 09/18/18 upon evaluation today the patient actually appears to be doing about the same in regard to her toe ulcer. She did not contact [employer] as I asked her to even though I had given her the prescription. In fact she actually states that she has no idea where the prescription is. She did apparently call [employer] and they told her that all she needed to do was bring the prescription in order to be able to be seen and work on getting the AFO for her  left foot. With all that being said she still does not have an appointment and I'm not sure were things stand that regard. I will give her a new prescription today in order to contact them to get this set up. 09/25/18 on evaluation today patient actually appears to be doing about the same in regard to her toes ulcer. She does have a small areas which seems to have a lot of callous buildup around the edge of the wound which is going to need sharp debridement today. She still is waiting to be scheduled for evaluation with [employer] for possibility of an AFO. She states there supposed to call her tomorrow to  get this set up. Unfortunately it does appear that her foot specifically the toe area is showing signs of erythema. There does not appear to be any systemic infection which is in these good news. DELCENIA, INMAN A. ([id number]) 10/02/18 on evaluation today patient actually appears to be doing about the same in regard to her toe ulcer. This really has not done too well although it's not significantly larger it's also not significantly smaller. She has been tolerating the dressing changes without complication. She actually has her appointment with [employer] and [facility] tomorrow to hopefully be measured for obtaining and AFO splint. I think this would be helpful preventing this from reoccurring. We had contemplated starting the cast this week although to be honest I am reluctant to do that as she's been having nausea, vomiting, and seizure activity over the past three days. She has a history of seizures and have been told is nothing that can be done for these. With that being said I do believe that along with the seizures have the nausea vomiting which upon further questioning doesn't seem to be the normal for her and makes me concerned for the possibility of infection or something else going on. I discussed this with the patient and her mother during the office visit today. I do not feel the wound is  effective but maybe something else. The responses this was "this just happens to her at times and we don't know why". They did not seem to be interested in going to the hospital to have this checked out further. 10/09/18 on evaluation today patient presents for follow-up concerning her ongoing toe ulcer. She has been tolerating the dressing changes without complication. Fortunately there does not appear to be any evidence of infection which is great news however I do think that the patient would benefit from going ahead for with the total contact cast. She's actually in a wheelchair today she tells me that she will use her walker if we initiate the cast. I was very specific about the fact that if we were gonna do the cast I wanted to make sure that she was using the walker in order to prevent any falls. She tells me she does not have stairs that she has to traverse on a regular basis at her home. She has not had any seizures since last week again that something that happens to her often she tells me she did talk to [employer] and they said that it may take up to three weeks to get the brace approved for her. Hopefully that will not take that long but nonetheless in the meantime I do think the cast could be of benefit. 10/12/18 on evaluation today patient appears to be doing rather well in regard to her toe ulcer. It's just been a few days and already this is significantly improved both as far as overall appearance and size. Fortunately there's no sign of infection. She is here for her first obligatory cast change. 10/19/18 Seen today for follow up and management of left great toe ulcer. Wound continues to show improvement. Noted small open area with seroussang drainage with palpation. Denies any increased pain or recent fevers during visit. She will continue calcium alginate with offloading shoe. Denies any questions or concerns during visit. 10/26/18 on evaluation today patient appears to be doing about  the same as when I last saw her in regard to her wound bed. Fortunately there does not appear to be any signs of infection. Unfortunately she continues to  have a breakdown in regard to the toe region any time that she is not in the cast. It takes almost no time at all for this to happen. Nonetheless she still has not heard anything from the brace being made by [employer] as to when exactly this will be available to her. Fortunately there is no signs of infection at this time. 10/30/18 on evaluation today patient presents for application of the total contact cast as we just received him this morning. Fortunately we are gonna be able to apply this to her today which is great news. She continues to have no significant pain which is good news. Overall I do feel like things have been improving while she was the cast is when she doesn't have a cast that things get worse. She still has not really heard anything from [place] regarding her brace. 11/02/18 upon evaluation today patient's wound already appears to be doing significantly better which is good news. Fortunately there does not appear to be any signs of infection also good news. Overall I do think the total contact cast as before is helping to heal this area unfortunately it's just not gonna likely keep the area closed and healed without her getting her brace at least. Again the foot drop is a significant issue for her. 11/09/18 on evaluation today patient appears to be doing excellent in regard to her toe ulcer which in fact is completely healed. Fortunately we finally got the situation squared away with the paperwork which was needed to proceed with getting her brace approved by Medicaid. I have filled that out unfortunately that information has been sent to the orthopedic office that I worked at 2 1/2 years ago and not tired Current wound care measures. Fortunately she seems to be doing very well at this time. 11/23/18 on evaluation today patient appears  to be doing More Poorly Compared to Last Time I Saw Her. At [facility] She Had Completely Healed. Currently she is continuing to have issues with reopening. She states that she just found out that the brace was approved through Medicaid now she just has to go get measured in order to have this fitted for her and then made. Subsequently she does not have an appointment for this yet that is going to complicate things we obviously cannot put her back in the cast if we do not have everything measured because they're not gonna be able to measure her foot while she is in the cast. 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 Obviously this is unfortunate and does have me somewhat worried as well. 11/30/18 on evaluation today patient's toe ulcer actually appears to be doing fairly well. The good news is she will be getting her brace in the shoes next week on Wednesday. Hopefully we will be able to get this to heal without having to go back in the cast however she may need the cast in order to get the wound completely heal and then go from there. Fortunately there's no signs of infection at this time. 12/07/18 on evaluation today patient fortunately did receive her brace and she states she could tell this definitely makes her walk better. With that being said she's been having issues with her toe where she noticed yesterday there was a lot of tissue that was loosing off this appears to be much larger than what it was previous. She  also states that her leg has been read putting much across the top of her foot just about the ankle although this seems to be receiving somewhat. The total area is still red and appears to be someone infected as best I can tell. She is previously taken Bactrim and that may be a good option for her today as well. We are gonna see what I wound culture shows as well and I think that this  is definitely appropriate. With that being said outside of the culture I still need to initiate something in the interim and that's what I'm gonna go ahead and select Bactrim is a good option for her. 12/14/18 on evaluation today patient appears to be doing better in regard to her left great toe ulcer as compared to last week's evaluation. There's still some erythema although this is significantly improved which is excellent news. Overall I do believe that she is making good progress is still gonna take some time before she is where I would like her to be from the standpoint of being able to place her back into the total contact cast. Hopefully we will be where we need to be by next week. 12/21/18 on evaluation today patient actually appears to be doing poorly in regard to her toe ulcer. She's been tolerating the dressing changes without complication. Fortunately there's no signs of systemic infection although she does have a lot of drainage from the toe ulcer and this does seem to be causing some issues at this point. She does have erythema on the distal portion of her toe that appears to be likely cellulitis. 12/28/18 on evaluation today patient actually appears to be doing a little better in my pinion in regard to her toe ulcer. With that being said she still does have some evidence of infection at this time and for her culture she had both E. coli as well as enterococcus as organisms noted on evaluation. For that reason I think that though the Keflex likely has treated the E. coli rather well this has really done nothing for the enterococcus. We are going to have to initiate treatment for this specifically. KENLEY, RETTINGER A. ([id number]) 01/04/19 on evaluation today patient's toe actually appears to be doing better from the standpoint of infection. She currently would like to see about putting the cash back on I think that this is appropriate as long as she takes care of it and keeps it from getting  wet. She is gonna have some drainage we can definitely pass this up with Drawtex and alginate to try to prevent as much drainage as possible from causing the problems. With that being said I do want to at least try her with the cast between now and Tuesday. If there any issues we can't continue to use it then I will discontinue the use of the cast at that point. 01/08/19 on evaluation today patient actually appears to be doing very well as far as her foot ulcer specifically the great toe on the left is concerned. She did have an area of rubbing on the medial aspect of her left ankle which again is from the cast. Fortunately there's no signs of infection at this point in this appears to be a very slight skin breakdown. The patient tells me she felt it rubbing but didn't think it was that bad. Fortunately there is no signs of active infection at this time which is good news. No fevers, chills, nausea, or vomiting noted at this time. 01/15/19  on evaluation today patient actually appears to be doing well in regard to her toe ulcer. Again as previous she seems to do well and she has the cast on which indicates to me that during the time she doesn't have a cast on she's putting way too much pressure on this region. Obviously I think that's gonna be an issue as with the current national emergency concerning the Covid-19 Virus it has been recommended that we discontinue the use of total contact casting by the chief medical officer of our company, Dr. Simona Huh. The reasoning is that if a patient becomes sick and cannot come into have the cast removed they could not just leave this on for an additional two weeks. Obviously the hospitals also do not want to receive patient's who are sick into the emergency department to potentially contaminate the region and spread the Covid-19 Virus among other sick individuals within the hospital system. Therefore at this point we are suspending the use of total contact cast until  the current emergency subsides. This was all discussed with the patient today as well. 01/22/19 on evaluation today patient's wound on her left great toe appears to be doing slightly worse than previously noted last week. She tells me that she has been on this quite a bit in fact she tells me she's been awake for 38 straight hours. This is due to the fact that she's having to care for grandparents because nobody else will. She has been taking care of them for five the last seven days since I've seen her they both have dementia his is from a stroke and her grandmother's was progressive. Nonetheless she states even her mom who knows her condition and situation has only help two of those days to take care of them she's been taking care of the rest. Fortunately there does not appear to be any signs of active infection in regard to her toe at this point although obviously it doesn't look as good as it did previous. I think this is directly related to her not taking off the pressure and friction by way of taking things easy. Though I completely understand what's going on. 01/29/19 on evaluation today patient's tools are actually appears to be showing some signs of improvement today compared to last week's evaluation as far as not necessarily the overall size of the wound but the fact that she has some new skin growth in between the two ends of the wound opening. Overall I feel like she has done well she states that she had a family member give her what sounds to be a CAM walker boot which has been helpful as well. 02/05/19 on evaluation today patient's wound bed actually appears to be doing significantly better in regard to her overall appearance of the size of the wound. With that being said she is still having an issue with offloading efficiently enough to get this to close. Apparently there is some signs of infection at this point as well unfortunately. Previously she's done well of Augmentin I really do not  see anything that needs to be culture currently but there theme and cellulitis of the foot that I'm seeing I'm gonna go ahead and place her on an antibiotic today to try to help clear this up. 02/12/2019 on evaluation today patient actually appears to be doing poorly in regard to her overall wound status. She tells me she has been using her offloading shoe but actually comes in today wearing her tennis shoe with the  AFO brace. Again as I previously discussed with her this is really not sufficient to allow the area to heal appropriately. Nonetheless she continues to be somewhat noncompliant and I do wonder based on what she has told my nurse in the past as to whether or not a good portion of this noncompliance may be recreational drug and alcohol related. She has had a history of heroin overdose and this was fairly recently in the past couple of months that have been seeing her. Nonetheless overall I feel like her wound looks significantly worse today compared to what it was previous. She still has significant erythema despite the Augmentin I am not sure that this is an appropriate medication for her infection I am also concerned that the infection may have gone down into her bone. 02/19/19 on evaluation today patient actually appears to be doing about the same in regard to her toe ulcer. Unfortunately she continues to show signs of bone exposure and infection at this point. There does not appear to be any evidence of worsening of the infection but I'm also not really sure that it's getting significantly better. She is on the Augmentin which should be sufficient for the Staphylococcus aureus infection that she has at this point. With that being said she may need IV antibiotics to more appropriately treat this. We did have a discussion today about hyperbaric option therapy. 02/28/19 on evaluation today patient actually appears to be doing much worse in regard to the wound on her left great toe as compared  to even my previous evaluation last week. Unfortunately this seems to be training in a pretty poor direction. Her toe was actually now starting to angle laterally and I can actually see the entire joint area of the proximal portion of the digit where is the distal portion of the digit again is no longer even in contact with the joint line. Unfortunately there's a lot more necrotic tissue around the edge and the toe appears to be showing signs of becoming gangrenous in my pinion. I'm very concerned about were things stand at this point. She did see infectious disease and they are planning to send in a prescription for Sivextro for her and apparently this has been approved. With that being said I don't think she should avoid taking this but at the same time I'm not sure that it's gonna be sufficient to save her toe at this point. 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 03/05/19 on evaluation today patient actually appears to be doing quite well in regard to her toe all things considering. She still has bone exposed but there appears to be much less your thing on overall the appearance of the wound and the toe itself is dramatically improved. She still does have some issues currently obviously with infection she did see vascular as well and there concerned that her blood flow to the toad. For that reason they are setting up for an angiogram next week. 03/14/19 on evaluation today patient appears to be doing very poor in regard to her toe and specifically in regard to the ulceration and the fact that she's starting to notice the toe was leaning even more towards the lateral aspect and the complete joint is visible on the proximal aspect of the  joint. Nonetheless she's also noted a significant odor and  the tip of the toe is turning more dark and necrotic appearing. Overall I think she is getting worse not better as far as this is concerned. For that reason I am recommending at this point that she likely needs to be seen for likely amputation. READMISSION 03/19/2021 This is a patient that we cared for in this clinic for a prolonged period of time in 2019 and 2020 with a left foot and left first toe wound. 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 ([id number]) 04/09/20 by Dr. Luana Shu. In December 2021 she had an ulcer on her right great toe as well as the fourth and fifth toes. 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 She also had an angiogram at that time and underwent angioplasty of the right anterior tibial artery. In any case she claims that the wound on the right foot is closed I did not look at this today which was probably an oversight although I think that should be done next week. After her surgery she developed a dehiscence but I do not see any follow-up of this. According to Dr. Deborra Medina last review that she was out of the area being cared for by another physician but recently came back to his attention. 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 A culture of this area showed E. coli apparently before she came back to see Dr. Luana Shu she was supposed to be receiving antibiotics but she did not really take them. Nor is she offloading this area at all. Finally her last hemoglobin A1c listed in epic was in March 2022 at 14.1 she says things are a lot better since then although I am not sure. 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 She had this wound at the time. During this admission she had E. coli sepsis a TEE was negative. Past medical  history is extensive and includes type 2 diabetes with peripheral neuropathy cardiomyopathy with an ejection fraction of 33%, hypertension, hyperlipidemia chronic renal failure stage III history of substance abuse with cocaine although she claims to be clean now verified by her mother. She is still a heavy cigarette smoker. She has a history of bipolar disorder seizure disorder ABI in our clinic was 1.05 6/1; left midfoot in the setting of a TMA done previously. Round circular wound with a "knuckle" of protruding tissue. The problem is that the knuckle was not attached to any of the surrounding granulation and this probed proximally widely I removed a large portion of this tissue. This wound goes with considerable undermining laterally. I do not feel any bone there was no purulence but this is a deep wound. 6/8; in spite of the debridement I did last week. She arrives with a wound looking exactly the same. A protruding "knuckle" of tissue nonadherent to most of the surrounding tissue. There is considerable depth around this from 6-12 o'clock at 2.7 cm and undermining of 1 cm. This does not look overtly infected and the x-ray I did last week was negative for any osseous abnormalities. We have been using silver collagen 6/15; deep tissue culture I did last week showed moderate staph aureus and moderate Pseudomonas. This will definitely require prolonged antibiotic therapy. The pathology on the protuberant area  was negative for malignancy fungus etc. 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 We have been using silver collagen. I am going to be prescribing Levaquin for 2 weeks. Her CT scan of the foot is down for 7/5 6/22; CT scan of the foot on 7 5. She says she has hardware in the left leg from her previous fracture. She is on the Levaquin for the deep tissue culture I did that showed methicillin sensitive staph aureus and Pseudomonas. I gave  her a 2-week supply and she will have another week. She arrives in clinic today with the same protuberant tissue however this is nonadherent to the tissue surrounding it. I am really at a loss to explain this unless there is underlying deep tissue infection 6/29; patient presents for 1 week follow-up. She has been using collagen to the wound bed. She reports taking her antibiotics as prescribed.She has no complaints or issues today. She denies signs of infection. 7/6; patient presents for one week followup. She has been using collagen to the wound bed. She states she is taking Levaquin however at times she is not able to keep it down. She denies signs of infection. 7/13; patient presents for 1 week follow-up. She has been using silver alginate to the wound bed. She still has nausea when taking her antibiotics. She denies signs of infection. 7/20; patient presents for 1 week follow-up. She has been using silver alginate with gentamicin cream to the wound bed. She denies any issues and has no complaints today. She denies signs of infection. 7/27; patient presents for 1 week follow-up. She continues to use silver alginate with gentamicin cream to the wound bed. She reports starting her antibiotics. She has no issues or complaints. Overall she reports stability to the wound. 8/3; patient presents for 1 week follow-up. She has been using silver alginate with gentamicin cream to the wound bed. She reports completing all antibiotics. She has no issues or complaints today. She denies signs of infection. 8/17; patient presents for 2-week follow-up. He is to use silver alginate to the wound bed. She has no issues or complaints today. She denies signs of infection. She reports her pain has improved in her foot since last clinic visit 8/24; patient presents for 1 week follow-up. She continues to use silver alginate to the wound bed. She has no issues or complaints. She denies signs of infection. Pain is  stable. 9/7; patient presents for follow-up. She missed her last week appointment due to feeling ill. She continues to use silver alginate. She has a new wound to the right lower extremity that is covered in eschar. She states It occurred over the past week and has no idea how it started. She currently denies signs of infection. 9/14; patient presents for follow-up. To the left foot wound she has been using gentamicin cream and silver alginate. To the right lower extremity wound she has been keeping this covered and has not obtain Santyl. 9/21; patient presents for follow-up. She reports using gentamicin cream and silver alginate to the left foot and Santyl to the right lower extremity wound. She has no issues or complaints today. She denies signs of infection. 9/28; patient presents for follow-up. She reports a new wound to her right heel. She states this occurred a few days ago and is progressively gotten worse. She has been trying to clean the area with a Q-tip and Santyl. She reports stability in the other 2 wounds. She has  been using gentamicin cream and silver alginate to the left foot and Santyl to the right lower extremity wound. 10/12; patient presents for follow-up. She reports improvement to the wound beds. She is seeing vein and vascular to discuss the potential of a left BKA. She states they are going to do an arteriogram. She continues to use silver alginate with dressing changes to her wounds. 11/2; patient presents for follow-up. She states she has not been doing dressing changes to the wound beds. She states she is not able to offload the areas. She reports chronic pain to her left foot wound. 11/9; patient presents for follow-up. She came in with only socks on. She states she forgot to put on shoes. It is unclear if she is doing any dressing changes. She currently denies systemic signs of infection. 11/16; patient presents for follow-up. She came again only with socks on. She  states she does not wear shoes ever. It is unclear if she does dressing changes. She currently denies systemic signs of infection. 11/23; patient presents for follow-up. She wore her shoes today. It still unclear exactly what dressing she is using for each wound but she did states she obtained Dakin's solution and has been using this to the left foot wound. She currently denies signs of infection. 11/30; patient presents for follow-up. She has no issues or complaints today. She currently denies signs of infection. 12/7; patient presents for follow-up. She has no issues or complaints today. She has been using Hydrofera Blue to the right heel wound and Dakin solution to the left foot wound. Her right anterior leg wound is healed. She currently denies signs of infection. ASHLE, STIEF A. ([id number]) 12/14; patient presents for follow-up. She has been using Hydrofera Blue to the right heel and Dakin's to the left foot wounds. She has no issues or complaints today. She denies signs of infection. Electronic Signature(s) Signed: 10/06/2021 11:54:50 AM By: Kalman Shan DO Entered By: Kalman Shan on 10/06/2021 11:35:09 [name] ([id number]) -------------------------------------------------------------------------------- Physical Exam Details Patient Name: [name] A. Date of Service: 10/06/2021 10:30 AM Medical Record Number: [id number] Patient Account Number: [id number] Date of Birth/Sex: 1976-09-08 (45 y.o. F) Treating RN: Levora Dredge Primary Care Provider: Raelene Bott Other Clinician: Referring Provider: Raelene Bott Treating Provider/Extender: Yaakov Guthrie in Treatment: 28 Constitutional . Cardiovascular . Psychiatric . Notes Left foot (Transmetatarsal amputation): Wound to the plantar aspect that has granulation tissue With scant nonviable tissue and increased pockets of depth. There is circumferential callus. Right foot: To the heel there is an  open wound with Granulation tissue and Circumferential callus. No obvious signs of infection to any wound. Electronic Signature(s) Signed: 10/06/2021 11:54:50 AM By: Kalman Shan DO Entered By: Kalman Shan on 10/06/2021 11:36:15 [name] ([id number]) -------------------------------------------------------------------------------- Physician Orders Details Patient Name: [name] A. Date of Service: 10/06/2021 10:30 AM Medical Record Number: [id number] Patient Account Number: [id number] Date of Birth/Sex: 26-Dec-1975 (45 y.o. F) Treating RN: Levora Dredge Primary Care Provider: Raelene Bott Other Clinician: Referring Provider: Raelene Bott Treating Provider/Extender: Yaakov Guthrie in Treatment: 7 Verbal / Phone Orders: No Diagnosis Coding ICD-10 Coding Code Description 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ity L97.512 Non-pressure chronic ulcer of other part of right foot with fat layer exposed L97.812 Non-pressure chronic ulcer of other part of right lower leg with fat layer exposed E11.621 Type 2 diabetes mellitus with foot ulcer E11.42 Type 2 diabetes mellitus with diabetic polyneuropathy Follow-up Appointments   o Return Appointment in 1 week. Bathing/ Shower/ Hygiene o Wash wounds with antibacterial soap and water. Off-Loading Left Lower Extremity o Open toe surgical shoe with peg assist. o Turn and reposition every 2 hours o Other: - peg shoe right, off-loader left Medications-Please add to medication list. o P.O. Antibiotics Wound Treatment Wound #7 - Foot Wound Laterality: Plantar, Left, Midline Cleanser: Dakin 16 (oz) 0.25 1 x Per Day/30 Days Discharge Instructions: Use as directed. Primary Dressing: Gauze 1 x Per Day/30 Days Discharge Instructions: As directed: moistened with Dakins Solution Secured With: Kerlix Roll Sterile or Non-Sterile 6-ply 4.5x4 (yd/yd) 1 x Per  Day/30 Days Discharge Instructions: Apply Kerlix as directed Wound #9 - Calcaneus Wound Laterality: Right Cleanser: Soap and Water 1 x Per Day/30 Days Discharge Instructions: Gently cleanse wound with antibacterial soap, rinse and pat dry prior to dressing wounds Primary Dressing: Hydrofera Blue Ready Transfer Foam, 2.5x2.5 (in/in) 1 x Per Day/30 Days Discharge Instructions: Apply Hydrofera Blue Ready to wound bed as directed Secondary Dressing: Bordered Gauze Sterile-HBD 4x4 (in/in) 1 x Per Day/30 Days Discharge Instructions: Cover wound with Bordered Guaze Sterile as directed Secured With: coverlet 1 x Per Day/30 Days Electronic Signature(s) Signed: 10/06/2021 11:54:50 AM By: Kalman Shan DO Entered By: Kalman Shan on 10/06/2021 11:53:58 Sewell, Fumiko A. ([id number]) [name], Veronica A. ([id number]) -------------------------------------------------------------------------------- Problem List Details Patient Name: [name] A. Date of Service: 10/06/2021 10:30 AM Medical Record Number: [id number] Patient Account Number: [id number] Date of Birth/Sex: 1976/10/07 (45 y.o. F) Treating RN: Levora Dredge Primary Care Provider: Raelene Bott Other Clinician: Referring Provider: Raelene Bott Treating Provider/Extender: Yaakov Guthrie in Treatment: 28 Active Problems ICD-10 Encounter Code Description Active Date MDM Diagnosis 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 03/19/2021 No Yes severity L97.512 Non-pressure chronic ulcer of other part of right foot with fat layer 09/15/2021 No Yes exposed L97.812 Non-pressure chronic ulcer of other part of right lower leg with fat layer 09/15/2021 No Yes exposed E11.621 Type 2 diabetes mellitus with foot ulcer 03/19/2021 No Yes E11.42 Type 2 diabetes mellitus with diabetic polyneuropathy 03/19/2021 No Yes Inactive Problems ICD-10 Code Description Active Date Inactive Date S81.801A  Unspecified open wound, right lower leg, initial encounter 06/30/2021 06/30/2021 Resolved Problems Electronic Signature(s) Signed: 10/06/2021 11:54:50 AM By: Kalman Shan DO Entered By: Kalman Shan on 10/06/2021 11:34:36 Pettway, Ruthetta A. ([id number]) -------------------------------------------------------------------------------- Progress Note Details Patient Name: [name] A. Date of Service: 10/06/2021 10:30 AM Medical Record Number: [id number] Patient Account Number: [id number] Date of Birth/Sex: 1976/03/12 (45 y.o. F) Treating RN: Levora Dredge Primary Care Provider: Raelene Bott Other Clinician: Referring Provider: Raelene Bott Treating Provider/Extender: Yaakov Guthrie in Treatment: 28 Subjective Chief Complaint Information obtained from Patient 03/19/2021; patient referred by Dr. Luana Shu who has been looking after her left foot for quite a period of time for review of a nonhealing area in the left midfoot History of Present Illness (HPI) 01/18/18-She is here for initial evaluation of the left great toe ulcer. She is a poor historian in regards to timeframe in detail. She states approximately 4 weeks ago she lacerated her toe on something in the house. She followed up with her primary care who placed her on Bactrim and ultimately a second dose of Bactrim prior to coming to wound clinic. She states she has been treating the toe with peroxide, Betadine and a Band-Aid. She did not check her blood sugar this morning but checked it yesterday morning it was 327; she is  unaware of a recent A1c and there are no current records. She saw Dr. she would've orthopedics last week for an old injury to the left ankle, she states he did not see her toe, nor did she bring it to his attention. She smokes approximately 1 pack cigarettes a day. Her social situation is concerning, she arrives this morning with her mother who appears extremely intoxicated/under the influence; her  mother was asked to leave the room and be monitored by the patient's grandmother. The patient's aunt then accompanied the patient and the room throughout the rest of the appointment. We had a lengthy discussion regarding the deleterious effects of uncontrolled hyperglycemia and smoking as it relates to wound healing and overall health. She was strongly encouraged to decrease her smoking and get her diabetes under better control. She states she is currently on a diet and has cut down her [facility] consumption. The left toe is erythematous, macerated and slightly edematous with malodor present. The edema in her left foot is below her baseline, there is no erythema streaking. We will treat her with Santyl, doxycycline; we have ordered and xray, culture and provided a Peg assist surgical shoe and cultured the wound. 01/25/18-She is here in follow-up evaluation for a left great toe ulcer and presents with an abscess to her suprapubic area. She states her blood sugars remain elevated, feeling "sick" and if levels are below 250, but she is trying. She has made no attempt to decrease her smoking stating that we "can't take away her food in her cigarettes". She has been compliant with offloading using the PEG assist you. She is using Santyl daily. the culture obtained last week grew staph aureus and Enterococcus faecalis; continues on the doxycycline and Augmentin was added on Monday. The suprapubic area has erythema, no femoral variation, purple discoloration, minimal induration, was accessed with a cotton tip applicator with sanguinopurulent drainage, this was cultured, I suspect the current antibiotic treatment will cover and we will not add anything to her current treatment plan. She was advised to go to urgent care or ER with any change in redness, induration or fever. 02/01/18-She is here in follow-up evaluation for left great toe ulcers and a new abdominal abscess from last week. She was able to use  packing until earlier this week, where she "forgot it was there". She states she was feeling ill with GI symptoms last week and was not taking her antibiotic. She states her glucose levels have been predominantly less than 200, with occasional levels between 200-250. She thinks this was contributing to her GI symptoms as they have resolved without intervention. There continues to be significant laceration to left toe, otherwise it clinically looks stable/improved. There is now less superficial opening to the lateral aspect of the great toe that was residual blister. We will transition to [facility] to all wounds, she will continue her Augmentin. If there is no change or deterioration next week for reculture. 02/08/18-She is here in follow-up evaluation for left great toe ulcer and abdominal ulcer. There is an improvement in both wounds. She has been wrapping her left toe with coban, not by our direction, which has created an area of discoloration to the medial aspect; she has been advised to NOT use coban secondary to her neuropathy. She states her glucose levels have been high over this last week ranging from 200-350, she continues to smoke. She admits to being less compliant with her offloading shoe. We will continue with same treatment plan and  she will follow-up next week. 02/15/18-She is here in follow-up evaluation for left great toe ulcer and abdominal ulcer. The abdominal ulcer is epithelialized. The left great toe ulcer is improved and all injury from last week using the Coban wrap is resolved, the lateral ulcer is healed. She admits to noncompliance with wearing offloading shoe and admits to glucose levels being greater than 300 most of the week. She continues to smoke and expresses no desire to quit. There is one area medially that probes deeper than it has historically, erythema to the toe and dorsal foot has consistently waxed and waned. There is no overt signs of cellulitis or  infection but we will culture the wound for any occult infection given the new area of depth and erythema. We will hold off on sensitivities for initiation of antibiotic therapy. 02/22/18-She is here in follow up evaluation for left great toe ulcer. There is overall significant improvement in both wound appearance, erythema and edema with changes made last week. She was not initiated on antibiotic therapy. Culture obtained last week showed oxacillin sensitive staph aureus, sensitive to clindamycin. Clindamycin has been called into the pharmacy but she has been instructed to hold off on initiation secondary to overall clinical improvement and her history of antibiotic intolerance. She has been instructed to contact the clinic with any noted changes/deterioration and the wound, erythema, edema and/or pain. She will follow-up next week. She continues to smoke and her glucose levels remain elevated >250; she admits to compliance with offloading shoe 03/01/18 on evaluation today patient appears to be doing fairly well in regard to her left first toe ulcer. She has been tolerating the dressing changes with the [facility] without complication and overall this has definitely showed signs of improvement according to records as well is what the patient tells me today. I'm very pleased in that regard. She is having no pain today 03/08/18 She is here for follow up evaluation of a left great toe ulcer. She remains non-compliant with glucose control and smoking cessation; glucose levels consistently >200. She states that she got new shoe inserts/peg assist. She admits to compliance with offloading. Since my last evaluation there is significant improvement. We will switch to prisma at this time and she will follow up next week. She is noted to be tachycardic at this appointment, heart rate 120s; she has a history of heart rate 70-130 according to our records. She admits to extreme agitation r/t personal  issues; she was advised to monitor her heartrate and contact her physician if it does not return to a more normal range (<100). She takes cardizem twice daily. 03/15/18-She is here in follow-up evaluation for left great toe ulcer. She remains noncompliant with glucose control and smoking cessation. She admits to compliance with wearing offloading shoe. The ulcer is improved/stable and we will continue with the same treatment plan and she will follow-up next week 03/22/18-She is here for evaluation for left great toe ulcer. There continues to be significant improvement despite recurrent hyperglycemia (over 500 yesterday) and she continues to smoke. She has been compliant with offloading and we will continue with same treatment plan and she will follow-up next week. Cawley, Herta A. ([id number]) 03/29/18-She is here for evaluation for left great toe ulcer. Despite continuing to smoke and uncontrolled diabetes she continues to improve. She is compliant with offloading shoe. We will continue with the same treatment plan and she will follow-up next week 04/05/18- She is here in follow up evaluation for a  left great toe ulcer; she presents with small pustule to left fifth toe (resembles ant bite). She admits to compliance with wearing offloading shoe; continues to smoke or have uncontrolled blood glucose control. There is more callus than usual with evidence of bleeding; she denies known trauma. 04/12/18-She is here for evaluation of left great toe ulcer. Despite noncompliance with glycemic control and smoking she continues to make improvement. She continues to wear offloading shoe. The pustule, that was identified last week, to the left fifth toe is resolved. She will follow-up in 2 weeks 05/03/18-she is seen in follow-up evaluation for a left great toe ulcer. She is compliant with offloading, otherwise noncompliant with glycemic control and smoking. She has plateaued and there is minimal improvement noted. We  will transition to [facility], replaced the insert to her surgical shoe and she will follow-up in one week 05/10/18- She is here in follow up evaluation for a left great toe ulcer. It appears stable despite measurement change. We will continue with same treatment plan and follow up next week. 05/24/18-She is seen in follow-up evaluation for a left great toe ulcer. She remains compliant with offloading, has made significant improvement in her diet, decreasing the amount of sugar/soda. She said her recent A1c was 10.9 which is lower than. She did see a diabetic nutritionist/educator yesterday. She continues to smoke. We will continue with the same treatment plan and she'll follow-up next week. 05/31/18- She is seen in follow-up evaluation for left great toe ulcer. She continues to remain compliant with offloading, continues to make improvement in her diet, increasing her water and decreasing the amount of sugar/soda. She does continue to smoke with no desire to quit. We will apply Prisma to the depth and Hydrofera Blue over. We have not received insurance authorization for oasis. She will follow up next week. 06/07/18-She is seen in follow-up evaluation for left great toe ulcer. It has stalled according to today's measurements although base appears stable. She says she saw a diabetic educator yesterday; her average blood sugars are less than 300 which is an improvement for her. She continues to smoke and states "that's my next step" She continues with water over soda. We will order for xray, culture and reinstate ace wrap compression prior to placing apligraf for next week. She is voicing no complaints or concerns. Her dressing will change to iodoflex over the next week in preparation for apligraf. 06/14/18-She is seen in follow-up evaluation for left great toe ulcer. Plain film x-ray performed last week was negative for osteomyelitis. Wound culture obtained last week grew strep B and OSSA; she is  initiated on keflex and cefdinir today; there is erythema to the toe which could be from ace wrap compression, she has a history of wrapping too tight and has has been encouraged to maintain ace wraps that we place today. We will hold off on application of apligraf today, will apply next week after antibiotic therapy has been initiated. She admits today that she has resumed taking a shower with her foot/toe submerged in water, she has been reminded to keep foot/toe out of the bath water. She will be seen in follow up next week 06/21/18-she is seen in follow-up evaluation for left great toe ulcer. She is tolerating antibiotic therapy with no GI disturbance. The wound is stable. Apligraf was applied today. She has been decreasing her smoking, only had 4 cigarettes yesterday and 1 today. She continues being more compliant in diabetic diet. She will follow-up next week for  evaluation of site, if stable will remove at 2 weeks. 06/28/18- She is here in follow up evalution. Apligraf was placed last week, she states the dressing fell off on Tuesday and she was dressing with hydrofera blue. She is healed and will be discharged from the clinic today. She has been instructed to continue with smoking cessation, continue monitoring glucose levels, offloading for an additional 4 weeks and continue with hydrofera blue for additional two weeks for any possible microscopic opening. Readmission: 08/07/18 on evaluation today patient presents for reevaluation concerning the ulcer of her right great toe. She was previously discharged on 06/28/18 healed. Nonetheless she states that this began to show signs of drainage she subsequently went to her primary care provider. Subsequently an x-ray was performed on 08/01/18 which was negative. The patient was also placed on antibiotics at that time. Fortunately they should have been effective for the infection. Nonetheless she's been experiencing some improvement but still has a lot of  drainage coming from the wound itself. 08/14/18 on evaluation today patient's wound actually does show signs of improvement in regard to the erythema at this point. She has completed the antibiotics. With that being said we did discuss the possibility of placing her in a total contact cast as of today although I think that I may want to give this just a little bit more time to ensure nothing recurrence as far as her infection is concerned. I do not want to put in the cast and risk infection at that time if things are not completely resolved. With that being said she is gonna require some debridement today. 08/21/18 on evaluation today patient actually appears to be doing okay in regard to her toe ulcer. She's been tolerating the dressing changes without complication. With that being said it does appear that she is ready and in fact I think it's appropriate for [place] to go ahead and initiate the total contact cast today. Nonetheless she will require some sharp debridement to prepare the wound for application. Overall I feel like things have been progressing well but we do need to do something to get this to close more readily. 08/24/18 patient seen today for reevaluation after having had the total contact cast applied on Tuesday. She seems to have done very well the wound appears to be doing great and overall I'm pleased with the progress that she's made. There were no abnormal areas of rubbing from the cast on her lower extremity. 08/30/18 on evaluation today patient actually appears to be completely healed in regard to her plantar toe ulcer. She tells me at this point she's been having a lot of issues with the cast. She almost fell a couple of times the state shall the step of her dog a couple times as well. This is been a very frustrating process for her other nonetheless she has completely healed the wound which is excellent news. Overall there does not appear to be the evidence of infection at this  time which is great news. 09/11/18 evaluation today patient presents for follow-up concerning her great toe ulcer on the left which has unfortunately reopened since I last saw her which was only a couple of weeks ago. Unfortunately she was not able to get in to get the shoe and potentially the AFO that's gonna be necessary due to her left foot drop. She continues with offloading shoe but this is not enough to prevent her from reopening it appears. When we last had her in the total contact  cast she did well from a healing standpoint but unfortunately the wound reopened as soon as she came out of the cast within just a couple of weeks. 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 This is also part of the reason why she falls on a regular basis. I just do not believe that is gonna be safe for [place] to reinitiate the total contact cast as last time we had this on she fell 3 times one week which is definitely not normal for her. 09/18/18 upon evaluation today the patient actually appears to be doing about the same in regard to her toe ulcer. She did not contact [employer] as I asked her to even though I had given her the prescription. In fact she actually states that she has no idea where the prescription is. She did apparently call [employer] and they told her that all she needed to do was bring the prescription in order to be able to be seen and work on getting the AFO for her left foot. With all that being said she still does not have an appointment and I'm not sure were things stand that regard. I will give her a new prescription today in order to contact them to get this set up. MALYNN, LUCY A. ([id number]) 09/25/18 on evaluation today patient actually appears to be doing about the same in regard to her toes ulcer.  She does have a small areas which seems to have a lot of callous buildup around the edge of the wound which is going to need sharp debridement today. She still is waiting to be scheduled for evaluation with [employer] for possibility of an AFO. She states there supposed to call her tomorrow to get this set up. Unfortunately it does appear that her foot specifically the toe area is showing signs of erythema. There does not appear to be any systemic infection which is in these good news. 10/02/18 on evaluation today patient actually appears to be doing about the same in regard to her toe ulcer. This really has not done too well although it's not significantly larger it's also not significantly smaller. She has been tolerating the dressing changes without complication. She actually has her appointment with [employer] and [place] tomorrow to hopefully be measured for obtaining and AFO splint. I think this would be helpful preventing this from reoccurring. We had contemplated starting the cast this week although to be honest I am reluctant to do that as she's been having nausea, vomiting, and seizure activity over the past three days. She has a history of seizures and have been told is nothing that can be done for these. With that being said I do believe that along with the seizures have the nausea vomiting which upon further questioning doesn't seem to be the normal for her and makes me concerned for the possibility of infection or something else going on. I discussed this with the patient and her mother during the office visit today. I do not feel the wound is effective but maybe something else. The responses this was "this just happens to her at times and we don't know why". They did not seem to be interested in going to the hospital to have this checked out further. 10/09/18 on evaluation today patient presents for  follow-up concerning her ongoing toe ulcer. She has been tolerating the dressing  changes without complication. Fortunately there does not appear to be any evidence of infection which is great news however I do think that the patient would benefit from going ahead for with the total contact cast. She's actually in a wheelchair today she tells me that she will use her walker if we initiate the cast. I was very specific about the fact that if we were gonna do the cast I wanted to make sure that she was using the walker in order to prevent any falls. She tells me she does not have stairs that she has to traverse on a regular basis at her home. She has not had any seizures since last week again that something that happens to her often she tells me she did talk to [employer] and they said that it may take up to three weeks to get the brace approved for her. Hopefully that will not take that long but nonetheless in the meantime I do think the cast could be of benefit. 10/12/18 on evaluation today patient appears to be doing rather well in regard to her toe ulcer. It's just been a few days and already this is significantly improved both as far as overall appearance and size. Fortunately there's no sign of infection. She is here for her first obligatory cast change. 10/19/18 Seen today for follow up and management of left great toe ulcer. Wound continues to show improvement. Noted small open area with seroussang drainage with palpation. Denies any increased pain or recent fevers during visit. She will continue calcium alginate with offloading shoe. Denies any questions or concerns during visit. 10/26/18 on evaluation today patient appears to be doing about the same as when I last saw her in regard to her wound bed. Fortunately there does not appear to be any signs of infection. Unfortunately she continues to have a breakdown in regard to the toe region any time that she is not in the cast. It takes almost no time at all for this to happen. Nonetheless she still has not heard anything from the  brace being made by [employer] as to when exactly this will be available to her. Fortunately there is no signs of infection at this time. 10/30/18 on evaluation today patient presents for application of the total contact cast as we just received him this morning. Fortunately we are gonna be able to apply this to her today which is great news. She continues to have no significant pain which is good news. Overall I do feel like things have been improving while she was the cast is when she doesn't have a cast that things get worse. She still has not really heard anything from [place] regarding her brace. 11/02/18 upon evaluation today patient's wound already appears to be doing significantly better which is good news. Fortunately there does not appear to be any signs of infection also good news. Overall I do think the total contact cast as before is helping to heal this area unfortunately it's just not gonna likely keep the area closed and healed without her getting her brace at least. Again the foot drop is a significant issue for her. 11/09/18 on evaluation today patient appears to be doing excellent in regard to her toe ulcer which in fact is completely healed. Fortunately we finally got the situation squared away with the paperwork which was needed to proceed with getting her brace approved by Medicaid. I have filled that  out unfortunately that information has been sent to the orthopedic office that I worked at 2 1/2 years ago and not tired Current wound care measures. Fortunately she seems to be doing very well at this time. 11/23/18 on evaluation today patient appears to be doing More Poorly Compared to Last Time I Saw Her. At [facility] She Had Completely Healed. Currently she is continuing to have issues with reopening. She states that she just found out that the brace was approved through Medicaid now she just has to go get measured in order to have this fitted for her and then made. Subsequently she  does not have an appointment for this yet that is going to complicate things we obviously cannot put her back in the cast if we do not have everything measured because they're not gonna be able to measure her foot while she is in the cast. 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 Obviously this is unfortunate and does have me somewhat worried as well. 11/30/18 on evaluation today patient's toe ulcer actually appears to be doing fairly well. The good news is she will be getting her brace in the shoes next week on Wednesday. Hopefully we will be able to get this to heal without having to go back in the cast however she may need the cast in order to get the wound completely heal and then go from there. Fortunately there's no signs of infection at this time. 12/07/18 on evaluation today patient fortunately did receive her brace and she states she could tell this definitely makes her walk better. With that being said she's been having issues with her toe where she noticed yesterday there was a lot of tissue that was loosing off this appears to be much larger than what it was previous. She also states that her leg has been read putting much across the top of her foot just about the ankle although this seems to be receiving somewhat. The total area is still red and appears to be someone infected as best I can tell. She is previously taken Bactrim and that may be a good option for her today as well. We are gonna see what I wound culture shows as well and I think that this is definitely appropriate. With that being said outside of the culture I still need to initiate something in the interim and that's what I'm gonna go ahead and select Bactrim is a good option for her. 12/14/18 on evaluation today patient appears to be doing better in regard to her left great toe ulcer as compared to last week's evaluation.  There's still some erythema although this is significantly improved which is excellent news. Overall I do believe that she is making good progress is still gonna take some time before she is where I would like her to be from the standpoint of being able to place her back into the total contact cast. Hopefully we will be where we need to be by next week. 12/21/18 on evaluation today patient actually appears to be doing poorly in regard to her toe ulcer. She's been tolerating the dressing changes without complication. Fortunately there's no signs of systemic infection although she does have a lot of drainage from the toe ulcer and this does Teicher, Adessa A. ([id number]) seem to be causing some issues at this point. She does have erythema on the distal portion  of her toe that appears to be likely cellulitis. 12/28/18 on evaluation today patient actually appears to be doing a little better in my pinion in regard to her toe ulcer. With that being said she still does have some evidence of infection at this time and for her culture she had both E. coli as well as enterococcus as organisms noted on evaluation. For that reason I think that though the Keflex likely has treated the E. coli rather well this has really done nothing for the enterococcus. We are going to have to initiate treatment for this specifically. 01/04/19 on evaluation today patient's toe actually appears to be doing better from the standpoint of infection. She currently would like to see about putting the cash back on I think that this is appropriate as long as she takes care of it and keeps it from getting wet. She is gonna have some drainage we can definitely pass this up with Drawtex and alginate to try to prevent as much drainage as possible from causing the problems. With that being said I do want to at least try her with the cast between now and Tuesday. If there any issues we can't continue to use it then I will discontinue the use of  the cast at that point. 01/08/19 on evaluation today patient actually appears to be doing very well as far as her foot ulcer specifically the great toe on the left is concerned. She did have an area of rubbing on the medial aspect of her left ankle which again is from the cast. Fortunately there's no signs of infection at this point in this appears to be a very slight skin breakdown. The patient tells me she felt it rubbing but didn't think it was that bad. Fortunately there is no signs of active infection at this time which is good news. No fevers, chills, nausea, or vomiting noted at this time. 01/15/19 on evaluation today patient actually appears to be doing well in regard to her toe ulcer. Again as previous she seems to do well and she has the cast on which indicates to me that during the time she doesn't have a cast on she's putting way too much pressure on this region. Obviously I think that's gonna be an issue as with the current national emergency concerning the Covid-19 Virus it has been recommended that we discontinue the use of total contact casting by the chief medical officer of our company, Dr. Simona Huh. The reasoning is that if a patient becomes sick and cannot come into have the cast removed they could not just leave this on for an additional two weeks. Obviously the hospitals also do not want to receive patient's who are sick into the emergency department to potentially contaminate the region and spread the Covid-19 Virus among other sick individuals within the hospital system. Therefore at this point we are suspending the use of total contact cast until the current emergency subsides. This was all discussed with the patient today as well. 01/22/19 on evaluation today patient's wound on her left great toe appears to be doing slightly worse than previously noted last week. She tells me that she has been on this quite a bit in fact she tells me she's been awake for 38 straight hours. This is  due to the fact that she's having to care for grandparents because nobody else will. She has been taking care of them for five the last seven days since I've seen her they both have dementia his  is from a stroke and her grandmother's was progressive. Nonetheless she states even her mom who knows her condition and situation has only help two of those days to take care of them she's been taking care of the rest. Fortunately there does not appear to be any signs of active infection in regard to her toe at this point although obviously it doesn't look as good as it did previous. I think this is directly related to her not taking off the pressure and friction by way of taking things easy. Though I completely understand what's going on. 01/29/19 on evaluation today patient's tools are actually appears to be showing some signs of improvement today compared to last week's evaluation as far as not necessarily the overall size of the wound but the fact that she has some new skin growth in between the two ends of the wound opening. Overall I feel like she has done well she states that she had a family member give her what sounds to be a CAM walker boot which has been helpful as well. 02/05/19 on evaluation today patient's wound bed actually appears to be doing significantly better in regard to her overall appearance of the size of the wound. With that being said she is still having an issue with offloading efficiently enough to get this to close. Apparently there is some signs of infection at this point as well unfortunately. Previously she's done well of Augmentin I really do not see anything that needs to be culture currently but there theme and cellulitis of the foot that I'm seeing I'm gonna go ahead and place her on an antibiotic today to try to help clear this up. 02/12/2019 on evaluation today patient actually appears to be doing poorly in regard to her overall wound status. She tells me she has been  using her offloading shoe but actually comes in today wearing her tennis shoe with the AFO brace. Again as I previously discussed with her this is really not sufficient to allow the area to heal appropriately. Nonetheless she continues to be somewhat noncompliant and I do wonder based on what she has told my nurse in the past as to whether or not a good portion of this noncompliance may be recreational drug and alcohol related. She has had a history of heroin overdose and this was fairly recently in the past couple of months that have been seeing her. Nonetheless overall I feel like her wound looks significantly worse today compared to what it was previous. She still has significant erythema despite the Augmentin I am not sure that this is an appropriate medication for her infection I am also concerned that the infection may have gone down into her bone. 02/19/19 on evaluation today patient actually appears to be doing about the same in regard to her toe ulcer. Unfortunately she continues to show signs of bone exposure and infection at this point. There does not appear to be any evidence of worsening of the infection but I'm also not really sure that it's getting significantly better. She is on the Augmentin which should be sufficient for the Staphylococcus aureus infection that she has at this point. With that being said she may need IV antibiotics to more appropriately treat this. We did have a discussion today about hyperbaric option therapy. 02/28/19 on evaluation today patient actually appears to be doing much worse in regard to the wound on her left great toe as compared to even my previous evaluation last week. Unfortunately this seems  to be training in a pretty poor direction. Her toe was actually now starting to angle laterally and I can actually see the entire joint area of the proximal portion of the digit where is the distal portion of the digit again is no longer even in contact with the  joint line. Unfortunately there's a lot more necrotic tissue around the edge and the toe appears to be showing signs of becoming gangrenous in my pinion. I'm very concerned about were things stand at this point. She did see infectious disease and they are planning to send in a prescription for Sivextro for her and apparently this has been approved. With that being said I don't think she should avoid taking this but at the same time I'm not sure that it's gonna be sufficient to save her toe at this point. 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 03/05/19 on evaluation today patient actually appears to be doing quite well in regard to her toe all things considering. She still has bone exposed but there appears to be much less your thing on overall the appearance of the wound and the toe itself is dramatically improved. She still does have some issues currently obviously with infection she did see vascular as well and there concerned that her blood flow to the toad. For that reason they are setting up for an angiogram next week. 03/14/19 on evaluation today patient appears to be doing very poor in regard to her toe and specifically in regard to the ulceration and the fact that she's starting to notice the toe was leaning even more towards the lateral aspect and the complete joint is visible on the proximal aspect of the joint. Nonetheless she's also noted a significant odor and the tip of the toe is turning more dark and necrotic appearing. Overall I think she is getting worse not better as far as this is concerned. For that reason I am recommending at this point that she likely needs to be seen for likely amputation. SKYLAN, GIFT ([id number]) READMISSION 03/19/2021 This is a patient that we  cared for in this clinic for a prolonged period of time in 2019 and 2020 with a left foot and left first toe wound. 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 Luana Shu. In December 2021 she had an ulcer on her right great toe as well as the fourth and fifth toes. 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 She also had an angiogram at that time and underwent angioplasty of the right anterior tibial artery. In any case she claims that the wound on the right foot is closed I did not look at this today which was probably an oversight although I think that should be done next week. After her surgery she developed a dehiscence but I do not see any follow-up of this. According to Dr. Deborra Medina last review that she was out of the area being cared for by another physician but recently came back to his attention. 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 A culture of this area showed E. coli apparently before she came back to  see Dr. Luana Shu she was supposed to be receiving antibiotics but she did not really take them. Nor is she offloading this area at all. Finally her last hemoglobin A1c listed in epic was in March 2022 at 14.1 she says things are a lot better since then although I am not sure. 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 She had this wound at the time. During this admission she had E. coli sepsis a TEE was negative. 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 She is still a heavy cigarette smoker.  She has a history of bipolar disorder seizure disorder ABI in our clinic was 1.05 6/1; left midfoot in the setting of a TMA done previously. Round circular wound with a "knuckle" of protruding tissue. The problem is that the knuckle was not attached to any of the surrounding granulation and this probed proximally widely I removed a large portion of this tissue. This wound goes with considerable undermining laterally. I do not feel any bone there was no purulence but this is a deep wound. 6/8; in spite of the debridement I did last week. She arrives with a wound looking exactly the same. A protruding "knuckle" of tissue nonadherent to most of the surrounding tissue. There is considerable depth around this from 6-12 o'clock at 2.7 cm and undermining of 1 cm. This does not look overtly infected and the x-ray I did last week was negative for any osseous abnormalities. We have been using silver collagen 6/15; deep tissue culture I did last week showed moderate staph aureus and moderate Pseudomonas. This will definitely require prolonged antibiotic therapy. The pathology on the protuberant area was negative for malignancy fungus etc. 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 We have been using silver collagen. I am going to be prescribing Levaquin for 2 weeks. Her CT scan of the foot is down for 7/5 6/22; CT scan of the foot on 7 5. She says she has hardware in the left leg from her previous fracture. She is on the Levaquin for the deep tissue culture I did that showed methicillin sensitive staph aureus and Pseudomonas. I gave her a 2-week supply and she will have another week. She arrives in clinic today with the same protuberant tissue however this is nonadherent to the tissue surrounding it. I am really at a loss to explain this unless there is underlying deep tissue infection 6/29; patient presents for 1 week follow-up. She has been using  collagen to the wound bed. She reports taking her antibiotics as prescribed.She has no complaints or issues today. She denies signs of infection. 7/6; patient presents for one week followup. She has been using collagen to the wound bed. She states she is taking Levaquin however at times she is not able to keep it down. She denies signs of infection. 7/13; patient presents for 1 week follow-up. She has been using silver alginate to the wound bed. She still has nausea when taking her antibiotics. She denies signs of infection. 7/20; patient presents for 1 week follow-up. She has been using silver alginate with gentamicin cream to the wound bed. She denies any issues and has no complaints today. She denies signs of infection.  7/27; patient presents for 1 week follow-up. She continues to use silver alginate with gentamicin cream to the wound bed. She reports starting her antibiotics. She has no issues or complaints. Overall she reports stability to the wound. 8/3; patient presents for 1 week follow-up. She has been using silver alginate with gentamicin cream to the wound bed. She reports completing all antibiotics. She has no issues or complaints today. She denies signs of infection. 8/17; patient presents for 2-week follow-up. He is to use silver alginate to the wound bed. She has no issues or complaints today. She denies signs of infection. She reports her pain has improved in her foot since last clinic visit 8/24; patient presents for 1 week follow-up. She continues to use silver alginate to the wound bed. She has no issues or complaints. She denies signs of infection. Pain is stable. 9/7; patient presents for follow-up. She missed her last week appointment due to feeling ill. She continues to use silver alginate. She has a new wound to the right lower extremity that is covered in eschar. She states It occurred over the past week and has no idea how it started. She currently denies signs of  infection. 9/14; patient presents for follow-up. To the left foot wound she has been using gentamicin cream and silver alginate. To the right lower extremity wound she has been keeping this covered and has not obtain Santyl. 9/21; patient presents for follow-up. She reports using gentamicin cream and silver alginate to the left foot and Santyl to the right lower extremity wound. She has no issues or complaints today. She denies signs of infection. 9/28; patient presents for follow-up. She reports a new wound to her right heel. She states this occurred a few days ago and is progressively gotten worse. She has been trying to clean the area with a Q-tip and Santyl. She reports stability in the other 2 wounds. She has been using gentamicin cream and silver alginate to the left foot and Santyl to the right lower extremity wound. 10/12; patient presents for follow-up. She reports improvement to the wound beds. She is seeing vein and vascular to discuss the potential of a left BKA. She states they are going to do an arteriogram. She continues to use silver alginate with dressing changes to her wounds. 11/2; patient presents for follow-up. She states she has not been doing dressing changes to the wound beds. She states she is not able to offload the areas. She reports chronic pain to her left foot wound. 11/9; patient presents for follow-up. She came in with only socks on. She states she forgot to put on shoes. It is unclear if she is doing any dressing changes. She currently denies systemic signs of infection. 11/16; patient presents for follow-up. She came again only with socks on. She states she does not wear shoes ever. It is unclear if she does dressing changes. She currently denies systemic signs of infection. 11/23; patient presents for follow-up. She wore her shoes today. It still unclear exactly what dressing she is using for each wound but she did states she obtained Dakin's solution and has been  using this to the left foot wound. She currently denies signs of infection. KASSIAH, MCCRORY ([id number]) 11/30; patient presents for follow-up. She has no issues or complaints today. She currently denies signs of infection. 12/7; patient presents for follow-up. She has no issues or complaints today. She has been using Hydrofera Blue to the right heel wound and Dakin solution to the  left foot wound. Her right anterior leg wound is healed. She currently denies signs of infection. 12/14; patient presents for follow-up. She has been using Hydrofera Blue to the right heel and Dakin's to the left foot wounds. She has no issues or complaints today. She denies signs of infection. Patient History Information obtained from Patient. Social History Current every day smoker, Marital Status - Single, Alcohol Use - Never, Drug Use - Prior History, Caffeine Use - Daily. Medical History Ear/Nose/Mouth/Throat Patient has history of Chronic sinus problems/congestion, Middle ear problems Hematologic/Lymphatic Patient has history of Anemia Respiratory Patient has history of Chronic Obstructive Pulmonary Disease (COPD) Cardiovascular Patient has history of Congestive Heart Failure - EF 33% Endocrine Patient has history of Type II Diabetes Genitourinary Patient has history of End Stage Renal Disease Integumentary (Skin) Patient has history of History of pressure wounds Neurologic Patient has history of Neuropathy - feel and legs Objective Constitutional Vitals Time Taken: 11:08 AM, Height: 69 in, Weight: 185 lbs, BMI: 27.3, Temperature: 98.2 F, Pulse: 76 bpm, Respiratory Rate: 18 breaths/min, Blood Pressure: 108/75 mmHg. General Notes: Left foot (Transmetatarsal amputation): Wound to the plantar aspect that has granulation tissue With scant nonviable tissue and increased pockets of depth. There is circumferential callus. Right foot: To the heel there is an open wound with Granulation tissue  and Circumferential callus. No obvious signs of infection to any wound. Integumentary (Hair, Skin) Wound #7 status is Open. Original cause of wound was Gradually Appeared. The date acquired was: 08/07/2020. The wound has been in treatment 28 weeks. The wound is located on the [place]. The wound measures 4.3cm length x 3cm width x 0.4cm depth; 10.132cm^2 area and 4.053cm^3 volume. There is Fat Layer (Subcutaneous Tissue) exposed. There is no tunneling or undermining noted. There is a large amount of serosanguineous drainage noted. The wound margin is thickened. There is large (67-100%) pink, hyper - granulation within the wound bed. There is a small (1-33%) amount of necrotic tissue within the wound bed including Adherent Slough. Wound #9 status is Open. Original cause of wound was Gradually Appeared. The date acquired was: 07/18/2021. The wound has been in treatment 11 weeks. The wound is located on the Right Calcaneus. The wound measures 0.6cm length x 0.9cm width x 0.3cm depth; 0.424cm^2 area and 0.127cm^3 volume. There is Fat Layer (Subcutaneous Tissue) exposed. There is a medium amount of serosanguineous drainage noted. There is small (1-33%) pink granulation within the wound bed. There is a large (67-100%) amount of necrotic tissue within the wound bed including Adherent Slough. Assessment Active Problems ICD-10 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ity Borg, [name] 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 No signs of infection on exam. I recommended continuing Dakin's wet-to-dry dressings to this area. I recommended she do this daily. The right plantar foot wound is also  stable. I recommended continuing with Hydrofera Blue to this area. No signs of infection. I recommend continued aggressive offloading to the wound sites. Follow-up in 1 week Plan Follow-up Appointments: Return Appointment in 1 week. Bathing/ Shower/ Hygiene: Wash wounds with antibacterial soap and water. Off-Loading: Open toe surgical shoe with peg assist. Turn and reposition every 2 hours Other: - peg shoe right, off-loader left  Medications-Please add to medication list.: P.O. Antibiotics WOUND #7: - Foot Wound Laterality: Plantar, Left, Midline Cleanser: Dakin 16 (oz) 0.25 1 x Per Day/30 Days Discharge Instructions: Use as directed. Primary Dressing: Gauze 1 x Per Day/30 Days Discharge Instructions: As directed: moistened with Dakins Solution Secured With: Kerlix Roll Sterile or Non-Sterile 6-ply 4.5x4 (yd/yd) 1 x Per Day/30 Days Discharge Instructions: Apply Kerlix as directed WOUND #9: - Calcaneus Wound Laterality: Right Cleanser: Soap and Water 1 x Per Day/30 Days Discharge Instructions: Gently cleanse wound with antibacterial soap, rinse and pat dry prior to dressing wounds Primary Dressing: Hydrofera Blue Ready Transfer Foam, 2.5x2.5 (in/in) 1 x Per Day/30 Days Discharge Instructions: Apply Hydrofera Blue Ready to wound bed as directed Secondary Dressing: Bordered Gauze Sterile-HBD 4x4 (in/in) 1 x Per Day/30 Days Discharge Instructions: Cover wound with Bordered Guaze Sterile as directed Secured With: coverlet 1 x Per Day/30 Days 1. Dakin's wet-to-dry to the left foot 2. Hydrofera Blue to the right foot 3. Follow-up in 1 week Electronic Signature(s) Signed: 10/06/2021 11:54:50 AM By: Kalman Shan DO Entered By: Kalman Shan on 10/06/2021 11:54:26 [name] ([id number]) -------------------------------------------------------------------------------- ROS/PFSH Details Patient Name: [name] A. Date of Service: 10/06/2021 10:30 AM Medical Record  Number: [id number] Patient Account Number: [id number] Date of Birth/Sex: 1975-12-12 (45 y.o. F) Treating RN: Levora Dredge Primary Care Provider: Raelene Bott Other Clinician: Referring Provider: Raelene Bott Treating Provider/Extender: Yaakov Guthrie in Treatment: 28 Information Obtained From Patient Ear/Nose/Mouth/Throat Medical History: Positive for: Chronic sinus problems/congestion; Middle ear problems Hematologic/Lymphatic Medical History: Positive for: Anemia Respiratory Medical History: Positive for: Chronic Obstructive Pulmonary Disease (COPD) Cardiovascular Medical History: Positive for: Congestive Heart Failure - EF 33% Endocrine Medical History: Positive for: Type II Diabetes Time with diabetes: 14 years Treated with: Insulin, Oral agents Blood sugar tested every day: No Blood sugar testing results: Bedtime: 176 Genitourinary Medical History: Positive for: End Stage Renal Disease Integumentary (Skin) Medical History: Positive for: History of pressure wounds Neurologic Medical History: Positive for: Neuropathy - feel and legs HBO Extended History Items Ear/Nose/Mouth/Throat: Ear/Nose/Mouth/Throat: Chronic sinus problems/congestion Middle ear problems Immunizations Pneumococcal Vaccine: Received Pneumococcal Vaccination: No Colocho, [name] ([id number]) Implantable Devices No devices added Family and Social History Current every day smoker; Marital Status - Single; Alcohol Use: Never; Drug Use: Prior History; Caffeine Use: Daily Electronic Signature(s) Signed: 10/06/2021 11:54:50 AM By: Kalman Shan DO Signed: 10/06/2021 4:05:40 PM By: Levora Dredge Entered By: Kalman Shan on 10/06/2021 11:37:13 [name], [name] [name] [name] ([id number]) -------------------------------------------------------------------------------- SuperBill Details Patient Name: [name] A. Date of Service: 10/06/2021 Medical Record Number: [id number] Patient  Account Number: [id number] Date of Birth/Sex: 21-Jul-1976 (45 y.o. 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.812 Non-pressure chronic ulcer of other part of right lower leg with fat layer exposed E11.621 Type 2 diabetes mellitus with foot ulcer E11.42 Type 2 diabetes mellitus with diabetic polyneuropathy Facility Procedures CPT4 Code: [id number] Description: 99213 - WOUND CARE VISIT-LEV 3 EST PT Modifier: Quantity: 1 Physician Procedures CPT4 Code: 8182993 Description: 99213 - WC PHYS LEVEL 3 - EST PT Modifier: Quantity: 1 CPT4 Code: Description: ICD-10 Diagnosis Description L97.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 of other part of left foot with other specifie L97.512 Non-pressure chronic  ulcer of other part of right foot with fat layer exp E11.621 Type 2 diabetes mellitus with foot ulcer  E11.42 Type 2 diabetes mellitus with diabetic polyneuropathy Modifier: d severity osed Quantity: Electronic Signature(s) Signed: 10/06/2021 4:03:47 PM By: Levora Dredge Previous Signature: 10/06/2021 11:54:50 AM Version By: Kalman Shan DO Entered By: Levora Dredge on 10/06/2021 16:03:46

## 2021-10-13 ENCOUNTER — Other Ambulatory Visit: Payer: Self-pay

## 2021-10-13 ENCOUNTER — Encounter (HOSPITAL_BASED_OUTPATIENT_CLINIC_OR_DEPARTMENT_OTHER): Payer: Medicaid Other | Admitting: Internal Medicine

## 2021-10-13 DIAGNOSIS — E11621 Type 2 diabetes mellitus with foot ulcer: Secondary | ICD-10-CM

## 2021-10-13 DIAGNOSIS — L97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ity: Secondary | ICD-10-CM | POA: Diagnosis not present

## 2021-10-13 NOTE — Progress Notes (Signed)
CASTELLA, LERNER ([id number]) Visit Report for 10/13/2021 Chief Complaint Document Details Patient Name: [name], [name] Date of Service: 10/13/2021 10:30 AM Medical Record Number: [id number] Patient Account Number: [id number] Date of Birth/Sex: 11-27-1975 (45 y.o. F) Treating RN: Cornell Barman Primary Care Provider: Raelene Bott Other Clinician: Referring Provider: Raelene Bott Treating Provider/Extender: Yaakov Guthrie in Treatment: 29 Information Obtained from: Patient Chief Complaint 03/19/2021; patient referred by Dr. Luana Shu who has been looking after her left foot for quite a period of time for review of a nonhealing area in the left midfoot Electronic Signature(s) Signed: 10/13/2021 11:56:40 AM By: Kalman Shan DO Entered By: Kalman Shan on 10/13/2021 11:50:22 Plog, Lourene A. ([id number]) -------------------------------------------------------------------------------- Debridement Details Patient Name: Heather Dear A. Date of Service: 10/13/2021 10:30 AM Medical Record Number: [id number] Patient Account Number: [id number] Date of Birth/Sex: October 13, 1976 (45 y.o. F) Treating RN: Levora Dredge Primary Care Provider: Raelene Bott Other Clinician: Referring Provider: Raelene Bott Treating Provider/Extender: Yaakov Guthrie in Treatment: 29 Debridement Performed for Wound #9 Right Calcaneus Assessment: Performed By: Physician Kalman Shan, MD Debridement Type: Debridement Severity of Tissue Pre Debridement: Fat layer exposed Level of Consciousness (Pre- Awake and Alert procedure): Pre-procedure Verification/Time Out Yes - 11:22 Taken: Pain Control: Lidocaine 4% Topical Solution Total Area Debrided (L x W): 0.3 (cm) x 0.4 (cm) = 0.12 (cm) Tissue and other material Non-Viable, Callus, Slough, Subcutaneous, Slough debrided: Level: Skin/Subcutaneous Tissue Debridement Description: Excisional Instrument: Curette Bleeding:  Minimum Hemostasis Achieved: Pressure Response to Treatment: Procedure was tolerated well Level of Consciousness (Post- Awake and Alert procedure): Post Debridement Measurements of Total Wound Length: (cm) 0.8 Width: (cm) 1 Depth: (cm) 0.5 Volume: (cm) 0.314 Character of Wound/Ulcer Post Debridement: Stable Severity of Tissue Post Debridement: Fat layer exposed Post Procedure Diagnosis Same as Pre-procedure Electronic Signature(s) Signed: 10/13/2021 11:56:40 AM By: Kalman Shan DO Signed: 10/13/2021 12:56:33 PM By: Levora Dredge Entered By: Levora Dredge on 10/13/2021 11:23:52 [name], [name] ([id number]) -------------------------------------------------------------------------------- Debridement Details Patient Name: Heather Dear A. Date of Service: 10/13/2021 10:30 AM Medical Record Number: [id number] Patient Account Number: [id number] Date of Birth/Sex: Jul 05, 1976 (45 y.o. F) Treating RN: Levora Dredge Primary Care Provider: Raelene Bott Other Clinician: Referring Provider: Raelene Bott Treating Provider/Extender: Yaakov Guthrie in Treatment: 29 Debridement Performed for Wound #7 Left,Midline,Plantar Foot Assessment: Performed By: Physician Kalman Shan, MD Debridement Type: Debridement Severity of Tissue Pre Debridement: Fat layer exposed Level of Consciousness (Pre- Awake and Alert procedure): Pre-procedure Verification/Time Out Yes - 11:24 Taken: Pain Control: Lidocaine 4% Topical Solution Total Area Debrided (L x W): 4.6 (cm) x 2.7 (cm) = 12.42 (cm) Tissue and other material Viable, Non-Viable, Subcutaneous debrided: Level: Skin/Subcutaneous Tissue Debridement Description: Excisional Instrument: Curette Bleeding: Minimum Hemostasis Achieved: Silver Nitrate Response to Treatment: Procedure was tolerated well Level of Consciousness (Post- Awake and Alert procedure): Post Debridement Measurements of Total Wound Length: (cm)  4.6 Width: (cm) 2.7 Depth: (cm) 0.4 Volume: (cm) 3.902 Character of Wound/Ulcer Post Debridement: Stable Severity of Tissue Post Debridement: Fat layer exposed Post Procedure Diagnosis Same as Pre-procedure Electronic Signature(s) Signed: 10/13/2021 11:56:40 AM By: Kalman Shan DO Signed: 10/13/2021 12:56:33 PM By: Levora Dredge Entered By: Levora Dredge on 10/13/2021 11:27:12 [name], [name] ([id number]) -------------------------------------------------------------------------------- HPI Details Patient Name: Heather Dear A. Date of Service: 10/13/2021 10:30 AM Medical Record Number: [id number] Patient Account Number: [id number] Date of Birth/Sex: 1976/01/07 (45 y.o. F) Treating RN: Cornell Barman Primary Care Provider: Raelene Bott Other Clinician: Referring Provider: Raelene Bott Treating Provider/Extender: Yaakov Guthrie  in Treatment: 29 History of Present Illness HPI Description: 01/18/18-She is here for initial evaluation of the left great toe ulcer. She is a poor historian in regards to timeframe in detail. She states approximately 4 weeks ago she lacerated her toe on something in the house. She followed up with her primary care who placed her on Bactrim and ultimately a second dose of Bactrim prior to coming to wound clinic. She states she has been treating the toe with peroxide, Betadine and a Band-Aid. She did not check her blood sugar this morning but checked it yesterday morning it was 327; she is unaware of a recent A1c and there are no current records. She saw Dr. she would've orthopedics last week for an old injury to the left ankle, she states he did not see her toe, nor did she bring it to his attention. She smokes approximately 1 pack cigarettes a day. Her social situation is concerning, she arrives this morning with her mother who appears extremely intoxicated/under the influence; her mother was asked to leave the room and be monitored by the  patient's grandmother. The patient's aunt then accompanied the patient and the room throughout the rest of the appointment. We had a lengthy discussion regarding the deleterious effects of uncontrolled hyperglycemia and smoking as it relates to wound healing and overall health. She was strongly encouraged to decrease her smoking and get her diabetes under better control. She states she is currently on a diet and has cut down her [facility] consumption. The left toe is erythematous, macerated and slightly edematous with malodor present. The edema in her left foot is below her baseline, there is no erythema streaking. We will treat her with Santyl, doxycycline; we have ordered and xray, culture and provided a Peg assist surgical shoe and cultured the wound. 01/25/18-She is here in follow-up evaluation for a left great toe ulcer and presents with an abscess to her suprapubic area. She states her blood sugars remain elevated, feeling "sick" and if levels are below 250, but she is trying. She has made no attempt to decrease her smoking stating that we "can't take away her food in her cigarettes". She has been compliant with offloading using the PEG assist you. She is using Santyl daily. the culture obtained last week grew staph aureus and Enterococcus faecalis; continues on the doxycycline and Augmentin was added on Monday. The suprapubic area has erythema, no femoral variation, purple discoloration, minimal induration, was accessed with a cotton tip applicator with sanguinopurulent drainage, this was cultured, I suspect the current antibiotic treatment will cover and we will not add anything to her current treatment plan. She was advised to go to urgent care or ER with any change in redness, induration or fever. 02/01/18-She is here in follow-up evaluation for left great toe ulcers and a new abdominal abscess from last week. She was able to use packing until earlier this week, where she "forgot it was  there". She states she was feeling ill with GI symptoms last week and was not taking her antibiotic. She states her glucose levels have been predominantly less than 200, with occasional levels between 200-250. She thinks this was contributing to her GI symptoms as they have resolved without intervention. There continues to be significant laceration to left toe, otherwise it clinically looks stable/improved. There is now less superficial opening to the lateral aspect of the great toe that was residual blister. We will transition to [facility] to all wounds, she will continue her Augmentin.  If there is no change or deterioration next week for reculture. 02/08/18-She is here in follow-up evaluation for left great toe ulcer and abdominal ulcer. There is an improvement in both wounds. She has been wrapping her left toe with coban, not by our direction, which has created an area of discoloration to the medial aspect; she has been advised to NOT use coban secondary to her neuropathy. She states her glucose levels have been high over this last week ranging from 200-350, she continues to smoke. She admits to being less compliant with her offloading shoe. We will continue with same treatment plan and she will follow-up next week. 02/15/18-She is here in follow-up evaluation for left great toe ulcer and abdominal ulcer. The abdominal ulcer is epithelialized. The left great toe ulcer is improved and all injury from last week using the Coban wrap is resolved, the lateral ulcer is healed. She admits to noncompliance with wearing offloading shoe and admits to glucose levels being greater than 300 most of the week. She continues to smoke and expresses no desire to quit. There is one area medially that probes deeper than it has historically, erythema to the toe and dorsal foot has consistently waxed and waned. There is no overt signs of cellulitis or infection but we will culture the wound for any occult infection  given the new area of depth and erythema. We will hold off on sensitivities for initiation of antibiotic therapy. 02/22/18-She is here in follow up evaluation for left great toe ulcer. There is overall significant improvement in both wound appearance, erythema and edema with changes made last week. She was not initiated on antibiotic therapy. Culture obtained last week showed oxacillin sensitive staph aureus, sensitive to clindamycin. Clindamycin has been called into the pharmacy but she has been instructed to hold off on initiation secondary to overall clinical improvement and her history of antibiotic intolerance. She has been instructed to contact the clinic with any noted changes/deterioration and the wound, erythema, edema and/or pain. She will follow-up next week. She continues to smoke and her glucose levels remain elevated >250; she admits to compliance with offloading shoe 03/01/18 on evaluation today patient appears to be doing fairly well in regard to her left first toe ulcer. She has been tolerating the dressing changes with the [facility] Dressing without complication and overall this has definitely showed signs of improvement according to records as well is what the patient tells me today. I'm very pleased in that regard. She is having no pain today 03/08/18 She is here for follow up evaluation of a left great toe ulcer. She remains non-compliant with glucose control and smoking cessation; glucose levels consistently >200. She states that she got new shoe inserts/peg assist. She admits to compliance with offloading. Since my last evaluation there is significant improvement. We will switch to prisma at this time and she will follow up next week. She is noted to be tachycardic at this appointment, heart rate 120s; she has a history of heart rate 70-130 according to our records. She admits to extreme agitation r/t personal issues; she was advised to monitor her heartrate and contact her  physician if it does not return to a more normal range (<100). She takes cardizem twice daily. 03/15/18-She is here in follow-up evaluation for left great toe ulcer. She remains noncompliant with glucose control and smoking cessation. She admits to compliance with wearing offloading shoe. The ulcer is improved/stable and we will continue with the same treatment plan and she will  follow-up next week 03/22/18-She is here for evaluation for left great toe ulcer. There continues to be significant improvement despite recurrent hyperglycemia (over 500 yesterday) and she continues to smoke. She has been compliant with offloading and we will continue with same treatment plan and she will follow-up next week. 03/29/18-She is here for evaluation for left great toe ulcer. Despite continuing to smoke and uncontrolled diabetes she continues to improve. She is compliant with offloading shoe. We will continue with the same treatment plan and she will follow-up next week 04/05/18- She is here in follow up evaluation for a left great toe ulcer; she presents with small pustule to left fifth toe (resembles ant bite). She admits to compliance with wearing offloading shoe; continues to smoke or have uncontrolled blood glucose control. There is more callus than usual with evidence of bleeding; she denies known trauma. 04/12/18-She is here for evaluation of left great toe ulcer. Despite noncompliance with glycemic control and smoking she continues to make Suit, Opie A. ([id number]) improvement. She continues to wear offloading shoe. The pustule, that was identified last week, to the left fifth toe is resolved. She will follow-up in 2 weeks 05/03/18-she is seen in follow-up evaluation for a left great toe ulcer. She is compliant with offloading, otherwise noncompliant with glycemic control and smoking. She has plateaued and there is minimal improvement noted. We will transition to [facility], replaced the insert to  her surgical shoe and she will follow-up in one week 05/10/18- She is here in follow up evaluation for a left great toe ulcer. It appears stable despite measurement change. We will continue with same treatment plan and follow up next week. 05/24/18-She is seen in follow-up evaluation for a left great toe ulcer. She remains compliant with offloading, has made significant improvement in her diet, decreasing the amount of sugar/soda. She said her recent A1c was 10.9 which is lower than. She did see a diabetic nutritionist/educator yesterday. She continues to smoke. We will continue with the same treatment plan and she'll follow-up next week. 05/31/18- She is seen in follow-up evaluation for left great toe ulcer. She continues to remain compliant with offloading, continues to make improvement in her diet, increasing her water and decreasing the amount of sugar/soda. She does continue to smoke with no desire to quit. We will apply Prisma to the depth and Hydrofera Blue over. We have not received insurance authorization for oasis. She will follow up next week. 06/07/18-She is seen in follow-up evaluation for left great toe ulcer. It has stalled according to today's measurements although base appears stable. She says she saw a diabetic educator yesterday; her average blood sugars are less than 300 which is an improvement for her. She continues to smoke and states "that's my next step" She continues with water over soda. We will order for xray, culture and reinstate ace wrap compression prior to placing apligraf for next week. She is voicing no complaints or concerns. Her dressing will change to iodoflex over the next week in preparation for apligraf. 06/14/18-She is seen in follow-up evaluation for left great toe ulcer. Plain film x-ray performed last week was negative for osteomyelitis. Wound culture obtained last week grew strep B and OSSA; she is initiated on keflex and cefdinir today; there is erythema to the  toe which could be from ace wrap compression, she has a history of wrapping too tight and has has been encouraged to maintain ace wraps that we place today. We will hold off on application of  apligraf today, will apply next week after antibiotic therapy has been initiated. She admits today that she has resumed taking a shower with her foot/toe submerged in water, she has been reminded to keep foot/toe out of the bath water. She will be seen in follow up next week 06/21/18-she is seen in follow-up evaluation for left great toe ulcer. She is tolerating antibiotic therapy with no GI disturbance. The wound is stable. Apligraf was applied today. She has been decreasing her smoking, only had 4 cigarettes yesterday and 1 today. She continues being more compliant in diabetic diet. She will follow-up next week for evaluation of site, if stable will remove at 2 weeks. 06/28/18- She is here in follow up evalution. Apligraf was placed last week, she states the dressing fell off on Tuesday and she was dressing with hydrofera blue. She is healed and will be discharged from the clinic today. She has been instructed to continue with smoking cessation, continue monitoring glucose levels, offloading for an additional 4 weeks and continue with hydrofera blue for additional two weeks for any possible microscopic opening. Readmission: 08/07/18 on evaluation today patient presents for reevaluation concerning the ulcer of her right great toe. She was previously discharged on 06/28/18 healed. Nonetheless she states that this began to show signs of drainage she subsequently went to her primary care provider. Subsequently an x-ray was performed on 08/01/18 which was negative. The patient was also placed on antibiotics at that time. Fortunately they should have been effective for the infection. Nonetheless she's been experiencing some improvement but still has a lot of drainage coming from the wound itself. 08/14/18 on evaluation  today patient's wound actually does show signs of improvement in regard to the erythema at this point. She has completed the antibiotics. With that being said we did discuss the possibility of placing her in a total contact cast as of today although I think that I may want to give this just a little bit more time to ensure nothing recurrence as far as her infection is concerned. I do not want to put in the cast and risk infection at that time if things are not completely resolved. With that being said she is gonna require some debridement today. 08/21/18 on evaluation today patient actually appears to be doing okay in regard to her toe ulcer. She's been tolerating the dressing changes without complication. With that being said it does appear that she is ready and in fact I think it's appropriate for [place] to go ahead and initiate the total contact cast today. Nonetheless she will require some sharp debridement to prepare the wound for application. Overall I feel like things have been progressing well but we do need to do something to get this to close more readily. 08/24/18 patient seen today for reevaluation after having had the total contact cast applied on Tuesday. She seems to have done very well the wound appears to be doing great and overall I'm pleased with the progress that she's made. There were no abnormal areas of rubbing from the cast on her lower extremity. 08/30/18 on evaluation today patient actually appears to be completely healed in regard to her plantar toe ulcer. She tells me at this point she's been having a lot of issues with the cast. She almost fell a couple of times the state shall the step of her dog a couple times as well. This is been a very frustrating process for her other nonetheless she has completely healed the wound which is  excellent news. Overall there does not appear to be the evidence of infection at this time which is great news. 09/11/18 evaluation today patient  presents for follow-up concerning her great toe ulcer on the left which has unfortunately reopened since I last saw her which was only a couple of weeks ago. Unfortunately she was not able to get in to get the shoe and potentially the AFO that's gonna be necessary due to her left foot drop. She continues with offloading shoe but this is not enough to prevent her from reopening it appears. When we last had her in the total contact cast she did well from a healing standpoint but unfortunately the wound reopened as soon as she came out of the cast within just a couple of weeks. 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 This is also part of the reason why she falls on a regular basis. I just do not believe that is gonna be safe for [place] to reinitiate the total contact cast as last time we had this on she fell 3 times one week which is definitely not normal for her. 09/18/18 upon evaluation today the patient actually appears to be doing about the same in regard to her toe ulcer. She did not contact [employer] as I asked her to even though I had given her the prescription. In fact she actually states that she has no idea where the prescription is. She did apparently call [employer] and they told her that all she needed to do was bring the prescription in order to be able to be seen and work on getting the AFO for her left foot. With all that being said she still does not have an appointment and I'm not sure were things stand that regard. I will give her a new prescription today in order to contact them to get this set up. 09/25/18 on evaluation today patient actually appears to be doing about the same in regard to her toes ulcer. She does have a small areas which seems to have a lot of callous buildup around the edge of  the wound which is going to need sharp debridement today. She still is waiting to be scheduled for evaluation with [employer] for possibility of an AFO. She states there supposed to call her tomorrow to get this set up. Unfortunately it does appear that her foot specifically the toe area is showing signs of erythema. There does not appear to be any systemic infection which is in these good news. [name], [name] ([id number]) 10/02/18 on evaluation today patient actually appears to be doing about the same in regard to her toe ulcer. This really has not done too well although it's not significantly larger it's also not significantly smaller. She has been tolerating the dressing changes without complication. She actually has her appointment with [facility] and [place] tomorrow to hopefully be measured for obtaining and AFO splint. I think this would be helpful preventing this from reoccurring. We had contemplated starting the cast this week although to be honest I am reluctant to do that as she's been having nausea, vomiting, and seizure activity over the past three days. She has a history of seizures and have been told is nothing that can be done for these. With that being said  I do believe that along with the seizures have the nausea vomiting which upon further questioning doesn't seem to be the normal for her and makes me concerned for the possibility of infection or something else going on. I discussed this with the patient and her mother during the office visit today. I do not feel the wound is effective but maybe something else. The responses this was "this just happens to her at times and we don't know why". They did not seem to be interested in going to the hospital to have this checked out further. 10/09/18 on evaluation today patient presents for follow-up concerning her ongoing toe ulcer. She has been tolerating the dressing changes without complication. Fortunately there does not appear to be any  evidence of infection which is great news however I do think that the patient would benefit from going ahead for with the total contact cast. She's actually in a wheelchair today she tells me that she will use her walker if we initiate the cast. I was very specific about the fact that if we were gonna do the cast I wanted to make sure that she was using the walker in order to prevent any falls. She tells me she does not have stairs that she has to traverse on a regular basis at her home. She has not had any seizures since last week again that something that happens to her often she tells me she did talk to [employer] and they said that it may take up to three weeks to get the brace approved for her. Hopefully that will not take that long but nonetheless in the meantime I do think the cast could be of benefit. 10/12/18 on evaluation today patient appears to be doing rather well in regard to her toe ulcer. It's just been a few days and already this is significantly improved both as far as overall appearance and size. Fortunately there's no sign of infection. She is here for her first obligatory cast change. 10/19/18 Seen today for follow up and management of left great toe ulcer. Wound continues to show improvement. Noted small open area with seroussang drainage with palpation. Denies any increased pain or recent fevers during visit. She will continue calcium alginate with offloading shoe. Denies any questions or concerns during visit. 10/26/18 on evaluation today patient appears to be doing about the same as when I last saw her in regard to her wound bed. Fortunately there does not appear to be any signs of infection. Unfortunately she continues to have a breakdown in regard to the toe region any time that she is not in the cast. It takes almost no time at all for this to happen. Nonetheless she still has not heard anything from the brace being made by [employer] as to when exactly this will be available to  her. Fortunately there is no signs of infection at this time. 10/30/18 on evaluation today patient presents for application of the total contact cast as we just received him this morning. Fortunately we are gonna be able to apply this to her today which is great news. She continues to have no significant pain which is good news. Overall I do feel like things have been improving while she was the cast is when she doesn't have a cast that things get worse. She still has not really heard anything from [facility] regarding her brace. 11/02/18 upon evaluation today patient's wound already appears to be doing significantly better which is good news. Fortunately there does  not appear to be any signs of infection also good news. Overall I do think the total contact cast as before is helping to heal this area unfortunately it's just not gonna likely keep the area closed and healed without her getting her brace at least. Again the foot drop is a significant issue for her. 11/09/18 on evaluation today patient appears to be doing excellent in regard to her toe ulcer which in fact is completely healed. Fortunately we finally got the situation squared away with the paperwork which was needed to proceed with getting her brace approved by Medicaid. I have filled that out unfortunately that information has been sent to the orthopedic office that I worked at 2 1/2 years ago and not tired Current wound care measures. Fortunately she seems to be doing very well at this time. 11/23/18 on evaluation today patient appears to be doing More Poorly Compared to Last Time I Saw Her. At [facility] She Had Completely Healed. Currently she is continuing to have issues with reopening. She states that she just found out that the brace was approved through Medicaid now she just has to go get measured in order to have this fitted for her and then made. Subsequently she does not have an appointment for this yet that is going to complicate  things we obviously cannot put her back in the cast if we do not have everything measured because they're not gonna be able to measure her foot while she is in the cast. 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 Obviously this is unfortunate and does have me somewhat worried as well. 11/30/18 on evaluation today patient's toe ulcer actually appears to be doing fairly well. The good news is she will be getting her brace in the shoes next week on Wednesday. Hopefully we will be able to get this to heal without having to go back in the cast however she may need the cast in order to get the wound completely heal and then go from there. Fortunately there's no signs of infection at this time. 12/07/18 on evaluation today patient fortunately did receive her brace and she states she could tell this definitely makes her walk better. With that being said she's been having issues with her toe where she noticed yesterday there was a lot of tissue that was loosing off this appears to be much larger than what it was previous. She also states that her leg has been read putting much across the top of her foot just about the ankle although this seems to be receiving somewhat. The total area is still red and appears to be someone infected as best I can tell. She is previously taken Bactrim and that may be a good option for her today as well. We are gonna see what I wound culture shows as well and I think that this is definitely appropriate. With that being said outside of the culture I still need to initiate something in the interim and that's what I'm gonna go ahead and select Bactrim is a good option for her. 12/14/18 on evaluation today patient appears to be doing better in regard to her left great toe ulcer as compared to last week's evaluation. There's still some erythema although this is significantly improved which is  excellent news. Overall I do believe that she is making good progress is still gonna take some time  before she is where I would like her to be from the standpoint of being able to place her back into the total contact cast. Hopefully we will be where we need to be by next week. 12/21/18 on evaluation today patient actually appears to be doing poorly in regard to her toe ulcer. She's been tolerating the dressing changes without complication. Fortunately there's no signs of systemic infection although she does have a lot of drainage from the toe ulcer and this does seem to be causing some issues at this point. She does have erythema on the distal portion of her toe that appears to be likely cellulitis. 12/28/18 on evaluation today patient actually appears to be doing a little better in my pinion in regard to her toe ulcer. With that being said she still does have some evidence of infection at this time and for her culture she had both E. coli as well as enterococcus as organisms noted on evaluation. For that reason I think that though the Keflex likely has treated the E. coli rather well this has really done nothing for the enterococcus. We are going to have to initiate treatment for this specifically. [name], [name] ([id number]) 01/04/19 on evaluation today patient's toe actually appears to be doing better from the standpoint of infection. She currently would like to see about putting the cash back on I think that this is appropriate as long as she takes care of it and keeps it from getting wet. She is gonna have some drainage we can definitely pass this up with Drawtex and alginate to try to prevent as much drainage as possible from causing the problems. With that being said I do want to at least try her with the cast between now and Tuesday. If there any issues we can't continue to use it then I will discontinue the use of the cast at that point. 01/08/19 on evaluation today patient actually appears to  be doing very well as far as her foot ulcer specifically the great toe on the left is concerned. She did have an area of rubbing on the medial aspect of her left ankle which again is from the cast. Fortunately there's no signs of infection at this point in this appears to be a very slight skin breakdown. The patient tells me she felt it rubbing but didn't think it was that bad. Fortunately there is no signs of active infection at this time which is good news. No fevers, chills, nausea, or vomiting noted at this time. 01/15/19 on evaluation today patient actually appears to be doing well in regard to her toe ulcer. Again as previous she seems to do well and she has the cast on which indicates to me that during the time she doesn't have a cast on she's putting way too much pressure on this region. Obviously I think that's gonna be an issue as with the current national emergency concerning the Covid-19 Virus it has been recommended that we discontinue the use of total contact casting by the chief medical officer of our company, Dr. Simona Huh. The reasoning is that if a patient becomes sick and cannot come into have the cast removed they could not just leave this on for an additional two weeks. Obviously the hospitals also do not want to receive patient's who are sick into the emergency department to potentially contaminate the region and spread the Covid-19 Virus among other sick individuals within the hospital system. Therefore at this point we are suspending the use  of total contact cast until the current emergency subsides. This was all discussed with the patient today as well. 01/22/19 on evaluation today patient's wound on her left great toe appears to be doing slightly worse than previously noted last week. She tells me that she has been on this quite a bit in fact she tells me she's been awake for 38 straight hours. This is due to the fact that she's having to care for grandparents because nobody else  will. She has been taking care of them for five the last seven days since I've seen her they both have dementia his is from a stroke and her grandmother's was progressive. Nonetheless she states even her mom who knows her condition and situation has only help two of those days to take care of them she's been taking care of the rest. Fortunately there does not appear to be any signs of active infection in regard to her toe at this point although obviously it doesn't look as good as it did previous. I think this is directly related to her not taking off the pressure and friction by way of taking things easy. Though I completely understand what's going on. 01/29/19 on evaluation today patient's tools are actually appears to be showing some signs of improvement today compared to last week's evaluation as far as not necessarily the overall size of the wound but the fact that she has some new skin growth in between the two ends of the wound opening. Overall I feel like she has done well she states that she had a family member give her what sounds to be a CAM walker boot which has been helpful as well. 02/05/19 on evaluation today patient's wound bed actually appears to be doing significantly better in regard to her overall appearance of the size of the wound. With that being said she is still having an issue with offloading efficiently enough to get this to close. Apparently there is some signs of infection at this point as well unfortunately. Previously she's done well of Augmentin I really do not see anything that needs to be culture currently but there theme and cellulitis of the foot that I'm seeing I'm gonna go ahead and place her on an antibiotic today to try to help clear this up. 02/12/2019 on evaluation today patient actually appears to be doing poorly in regard to her overall wound status. She tells me she has been using her offloading shoe but actually comes in today wearing her tennis shoe with the  AFO brace. Again as I previously discussed with her this is really not sufficient to allow the area to heal appropriately. Nonetheless she continues to be somewhat noncompliant and I do wonder based on what she has told my nurse in the past as to whether or not a good portion of this noncompliance may be recreational drug and alcohol related. She has had a history of heroin overdose and this was fairly recently in the past couple of months that have been seeing her. Nonetheless overall I feel like her wound looks significantly worse today compared to what it was previous. She still has significant erythema despite the Augmentin I am not sure that this is an appropriate medication for her infection I am also concerned that the infection may have gone down into her bone. 02/19/19 on evaluation today patient actually appears to be doing about the same in regard to her toe ulcer. Unfortunately she continues to show signs of bone exposure and infection  at this point. There does not appear to be any evidence of worsening of the infection but I'm also not really sure that it's getting significantly better. She is on the Augmentin which should be sufficient for the Staphylococcus aureus infection that she has at this point. With that being said she may need IV antibiotics to more appropriately treat this. We did have a discussion today about hyperbaric option therapy. 02/28/19 on evaluation today patient actually appears to be doing much worse in regard to the wound on her left great toe as compared to even my previous evaluation last week. Unfortunately this seems to be training in a pretty poor direction. Her toe was actually now starting to angle laterally and I can actually see the entire joint area of the proximal portion of the digit where is the distal portion of the digit again is no longer even in contact with the joint line. Unfortunately there's a lot more necrotic tissue around the edge and the toe  appears to be showing signs of becoming gangrenous in my pinion. I'm very concerned about were things stand at this point. She did see infectious disease and they are planning to send in a prescription for Sivextro for her and apparently this has been approved. With that being said I don't think she should avoid taking this but at the same time I'm not sure that it's gonna be sufficient to save her toe at this point. 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 03/05/19 on evaluation today patient actually appears to be doing quite well in regard to her toe all things considering. She still has bone exposed but there appears to be much less your thing on overall the appearance of the wound and the toe itself is dramatically improved. She still does have some issues currently obviously with infection she did see vascular as well and there concerned that her blood flow to the toad. For that reason they are setting up for an angiogram next week. 03/14/19 on evaluation today patient appears to be doing very poor in regard to her toe and specifically in regard to the ulceration and the fact that she's starting to notice the toe was leaning even more towards the lateral aspect and the complete joint is visible on the proximal aspect of the joint. Nonetheless she's also noted a significant odor and the tip of the toe is turning more dark and necrotic appearing. Overall I think she is getting worse not better as far as this is concerned. For that reason I am recommending at this point that she likely needs to be seen for likely amputation. READMISSION 03/19/2021 This is a patient that we cared for in this clinic for a prolonged period of time in 2019 and 2020 with a left foot and left first toe wound. I  believe she ultimately became infected and underwent a left first toe amputation. 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 ([id number]) 04/09/20 by Dr. Luana Shu. In December 2021 she had an ulcer on her right great toe as well as the fourth and fifth toes. 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 She also had an angiogram at that time and underwent angioplasty of the  right anterior tibial artery. In any case she claims that the wound on the right foot is closed I did not look at this today which was probably an oversight although I think that should be done next week. After her surgery she developed a dehiscence but I do not see any follow-up of this. According to Dr. Deborra Medina last review that she was out of the area being cared for by another physician but recently came back to his attention. 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 A culture of this area showed E. coli apparently before she came back to see Dr. Luana Shu she was supposed to be receiving antibiotics but she did not really take them. Nor is she offloading this area at all. Finally her last hemoglobin A1c listed in epic was in March 2022 at 14.1 she says things are a lot better since then although I am not sure. 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 She had this wound at the time. During this admission she had E. coli sepsis a TEE was negative. 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 She is still a heavy cigarette smoker. She has a history of bipolar disorder seizure disorder ABI in our clinic was 1.05 6/1;  left midfoot in the setting of a TMA done previously. Round circular wound with a "knuckle" of protruding tissue. The problem is that the knuckle was not attached to any of the surrounding granulation and this probed proximally widely I removed a large portion of this tissue. This wound goes with considerable undermining laterally. I do not feel any bone there was no purulence but this is a deep wound. 6/8; in spite of the debridement I did last week. She arrives with a wound looking exactly the same. A protruding "knuckle" of tissue nonadherent to most of the surrounding tissue. There is considerable depth around this from 6-12 o'clock at 2.7 cm and undermining of 1 cm. This does not look overtly infected and the x-ray I did last week was negative for any osseous abnormalities. We have been using silver collagen 6/15; deep tissue culture I did last week showed moderate staph aureus and moderate Pseudomonas. This will definitely require prolonged antibiotic therapy. The pathology on the protuberant area was negative for malignancy fungus etc. 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 We have been using silver collagen. I am going to be prescribing Levaquin for 2 weeks. Her CT scan of the foot is down for 7/5 6/22; CT scan of the foot on 7 5. She says she has hardware in the left leg from her previous fracture. She is on the Levaquin for the deep tissue culture I did that showed methicillin sensitive staph aureus and Pseudomonas. I gave her a 2-week supply and she will have another week. She arrives in clinic today with the same protuberant tissue however this is nonadherent to the tissue surrounding it. I am really at a loss to explain this unless there is underlying deep tissue infection 6/29; patient presents for 1 week follow-up. She has been using collagen to the wound bed. She reports taking her antibiotics as prescribed.She has no  complaints or issues today. She denies  signs of infection. 7/6; patient presents for one week followup. She has been using collagen to the wound bed. She states she is taking Levaquin however at times she is not able to keep it down. She denies signs of infection. 7/13; patient presents for 1 week follow-up. She has been using silver alginate to the wound bed. She still has nausea when taking her antibiotics. She denies signs of infection. 7/20; patient presents for 1 week follow-up. She has been using silver alginate with gentamicin cream to the wound bed. She denies any issues and has no complaints today. She denies signs of infection. 7/27; patient presents for 1 week follow-up. She continues to use silver alginate with gentamicin cream to the wound bed. She reports starting her antibiotics. She has no issues or complaints. Overall she reports stability to the wound. 8/3; patient presents for 1 week follow-up. She has been using silver alginate with gentamicin cream to the wound bed. She reports completing all antibiotics. She has no issues or complaints today. She denies signs of infection. 8/17; patient presents for 2-week follow-up. He is to use silver alginate to the wound bed. She has no issues or complaints today. She denies signs of infection. She reports her pain has improved in her foot since last clinic visit 8/24; patient presents for 1 week follow-up. She continues to use silver alginate to the wound bed. She has no issues or complaints. She denies signs of infection. Pain is stable. 9/7; patient presents for follow-up. She missed her last week appointment due to feeling ill. She continues to use silver alginate. She has a new wound to the right lower extremity that is covered in eschar. She states It occurred over the past week and has no idea how it started. She currently denies signs of infection. 9/14; patient presents for follow-up. To the left foot wound she has been using  gentamicin cream and silver alginate. To the right lower extremity wound she has been keeping this covered and has not obtain Santyl. 9/21; patient presents for follow-up. She reports using gentamicin cream and silver alginate to the left foot and Santyl to the right lower extremity wound. She has no issues or complaints today. She denies signs of infection. 9/28; patient presents for follow-up. She reports a new wound to her right heel. She states this occurred a few days ago and is progressively gotten worse. She has been trying to clean the area with a Q-tip and Santyl. She reports stability in the other 2 wounds. She has been using gentamicin cream and silver alginate to the left foot and Santyl to the right lower extremity wound. 10/12; patient presents for follow-up. She reports improvement to the wound beds. She is seeing vein and vascular to discuss the potential of a left BKA. She states they are going to do an arteriogram. She continues to use silver alginate with dressing changes to her wounds. 11/2; patient presents for follow-up. She states she has not been doing dressing changes to the wound beds. She states she is not able to offload the areas. She reports chronic pain to her left foot wound. 11/9; patient presents for follow-up. She came in with only socks on. She states she forgot to put on shoes. It is unclear if she is doing any dressing changes. She currently denies systemic signs of infection. 11/16; patient presents for follow-up. She came again only with socks on. She states she does not wear shoes ever. It is unclear if she does dressing  changes. She currently denies systemic signs of infection. 11/23; patient presents for follow-up. She wore her shoes today. It still unclear exactly what dressing she is using for each wound but she did states she obtained Dakin's solution and has been using this to the left foot wound. She currently denies signs of infection. 11/30; patient  presents for follow-up. She has no issues or complaints today. She currently denies signs of infection. 12/7; patient presents for follow-up. She has no issues or complaints today. She has been using Hydrofera Blue to the right heel wound and Dakin solution to the left foot wound. Her right anterior leg wound is healed. She currently denies signs of infection. [name], [name] ([id number]) 12/14; patient presents for follow-up. She has been using Hydrofera Blue to the right heel and Dakin's to the left foot wounds. She has no issues or complaints today. She denies signs of infection. 12/21; patient presents for follow-up. She reports using Hydrofera Blue to the right heel and Dakin's to the left foot wound. She denies signs of infection. Electronic Signature(s) Signed: 10/13/2021 11:56:40 AM By: Kalman Shan DO Entered By: Kalman Shan on 10/13/2021 11:51:11 [name] ([id number]) -------------------------------------------------------------------------------- Physical Exam Details Patient Name: Heather Dear A. Date of Service: 10/13/2021 10:30 AM Medical Record Number: [id number] Patient Account Number: [id number] Date of Birth/Sex: [birth year] (45 y.o. F) Treating RN: Cornell Barman Primary Care Provider: Raelene Bott Other Clinician: Referring Provider: Raelene Bott Treating Provider/Extender: Yaakov Guthrie in Treatment: 41 Constitutional . Cardiovascular . Psychiatric . Notes Left foot (Transmetatarsal amputation): Wound to the plantar aspect that has granulation tissue With scant nonviable tissue and increased pockets of depth. Areas of hypergranulation. There is circumferential callus. Right foot: To the heel there is an open wound with Granulation tissue , Nonviable tissue andCircumferential callus. No obvious signs of infection to any wound. Electronic Signature(s) Signed: 10/13/2021 11:56:40 AM By: Kalman Shan DO Entered By: Kalman Shan on  10/13/2021 11:52:04 [name] ([id number]) -------------------------------------------------------------------------------- Physician Orders Details Patient Name: Heather Dear A. Date of Service: 10/13/2021 10:30 AM Medical Record Number: [id number] Patient Account Number: [id number] Date of Birth/Sex: 1976/01/16 (45 y.o. F) Treating RN: Levora Dredge Primary Care Provider: Raelene Bott Other Clinician: Referring Provider: Raelene Bott Treating Provider/Extender: Yaakov Guthrie in Treatment: 75 Verbal / Phone Orders: No Diagnosis Coding Follow-up Appointments o Return Appointment in 1 week. Bathing/ Shower/ Hygiene o Wash wounds with antibacterial soap and water. Off-Loading Left Lower Extremity o Open toe surgical shoe with peg assist. o Turn and reposition every 2 hours o Other: - peg shoe right, off-loader left Medications-Please add to medication list. o P.O. Antibiotics Wound Treatment Wound #7 - Foot Wound Laterality: Plantar, Left, Midline Cleanser: Dakin 16 (oz) 0.25 1 x Per Day/30 Days Discharge Instructions: Use as directed. Primary Dressing: Gauze 1 x Per Day/30 Days Discharge Instructions: As directed: moistened with Dakins Solution Secured With: Kerlix Roll Sterile or Non-Sterile 6-ply 4.5x4 (yd/yd) 1 x Per Day/30 Days Discharge Instructions: Apply Kerlix as directed Wound #9 - Calcaneus Wound Laterality: Right Cleanser: Soap and Water 1 x Per Day/30 Days Discharge Instructions: Gently cleanse wound with antibacterial soap, rinse and pat dry prior to dressing wounds Primary Dressing: Hydrofera Blue Ready Transfer Foam, 2.5x2.5 (in/in) 1 x Per Day/30 Days Discharge Instructions: Apply Hydrofera Blue Ready to wound bed as directed Secondary Dressing: Bordered Gauze Sterile-HBD 4x4 (in/in) 1 x Per Day/30 Days Discharge Instructions: Cover wound with Bordered Guaze Sterile as directed Secured With: coverlet 1 x  Per Day/30  Days Electronic Signature(s) Signed: 10/13/2021 11:56:40 AM By: Kalman Shan DO Entered By: Kalman Shan on 10/13/2021 11:55:04 [name] ([id number]) -------------------------------------------------------------------------------- Problem List Details Patient Name: Heather Dear A. Date of Service: 10/13/2021 10:30 AM Medical Record Number: [id number] Patient Account Number: [id number] Date of Birth/Sex: 07/16/1976 (45 y.o. 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 03/19/2021 No Yes severity L97.512 Non-pressure chronic ulcer of other part of right foot with fat layer 09/15/2021 No Yes exposed L97.812 Non-pressure chronic ulcer of other part of right lower leg with fat layer 09/15/2021 No Yes exposed E11.621 Type 2 diabetes mellitus with foot ulcer 03/19/2021 No Yes E11.42 Type 2 diabetes mellitus with diabetic polyneuropathy 03/19/2021 No Yes Inactive Problems ICD-10 Code Description Active Date Inactive Date S81.801A Unspecified open wound, right lower leg, initial encounter 06/30/2021 06/30/2021 Resolved Problems Electronic Signature(s) Signed: 10/13/2021 11:56:40 AM By: Kalman Shan DO Entered By: Kalman Shan on 10/13/2021 11:50:13 [name], [name] ([id number]) -------------------------------------------------------------------------------- Progress Note Details Patient Name: Heather Dear A. Date of Service: 10/13/2021 10:30 AM Medical Record Number: [id number] Patient Account Number: [id number] Date of Birth/Sex: 1976/03/04 (45 y.o. F) Treating RN: Cornell Barman Primary Care Provider: Raelene Bott Other Clinician: Referring Provider: Raelene Bott Treating Provider/Extender: Yaakov Guthrie in Treatment: 60 Subjective Chief Complaint Information obtained from Patient 03/19/2021; patient referred by Dr. Luana Shu who has been looking after her left foot for quite a period of time for review of a nonhealing area in the left midfoot History of Present Illness (HPI) 01/18/18-She is here for initial evaluation of the left great toe ulcer. She is a poor historian in regards to timeframe in detail. She states approximately 4 weeks ago she lacerated her toe on something in the house. She followed up with her primary care who placed her on Bactrim and ultimately a second dose of Bactrim prior to coming to wound clinic. She states she has been treating the toe with peroxide, Betadine and a Band-Aid. She did not check her blood sugar this morning but checked it yesterday morning it was 327; she is unaware of a recent A1c and there are no current records. She saw Dr. she would've orthopedics last week for an old injury to the left ankle, she states he did not see her toe, nor did she bring it to his attention. She smokes approximately 1 pack cigarettes a day. Her social situation is concerning, she arrives this morning with her mother who appears extremely intoxicated/under the influence; her mother was asked to leave the room and be monitored by the patient's grandmother. The patient's aunt then accompanied the patient and the room throughout the rest of the appointment. We had a lengthy discussion regarding the deleterious effects of uncontrolled hyperglycemia and smoking as it relates to wound healing and overall health. She was strongly encouraged to decrease her smoking and get her diabetes under better control. She states she is currently on a diet and has cut down her [facility] consumption. The left toe is erythematous, macerated and slightly edematous with malodor present. The edema in her left foot is below her baseline, there is no  erythema streaking. We will treat her with Santyl, doxycycline; we have ordered and xray, culture and provided a Peg assist surgical shoe and  cultured the wound. 01/25/18-She is here in follow-up evaluation for a left great toe ulcer and presents with an abscess to her suprapubic area. She states her blood sugars remain elevated, feeling "sick" and if levels are below 250, but she is trying. She has made no attempt to decrease her smoking stating that we "can't take away her food in her cigarettes". She has been compliant with offloading using the PEG assist you. She is using Santyl daily. the culture obtained last week grew staph aureus and Enterococcus faecalis; continues on the doxycycline and Augmentin was added on Monday. The suprapubic area has erythema, no femoral variation, purple discoloration, minimal induration, was accessed with a cotton tip applicator with sanguinopurulent drainage, this was cultured, I suspect the current antibiotic treatment will cover and we will not add anything to her current treatment plan. She was advised to go to urgent care or ER with any change in redness, induration or fever. 02/01/18-She is here in follow-up evaluation for left great toe ulcers and a new abdominal abscess from last week. She was able to use packing until earlier this week, where she "forgot it was there". She states she was feeling ill with GI symptoms last week and was not taking her antibiotic. She states her glucose levels have been predominantly less than 200, with occasional levels between 200-250. She thinks this was contributing to her GI symptoms as they have resolved without intervention. There continues to be significant laceration to left toe, otherwise it clinically looks stable/improved. There is now less superficial opening to the lateral aspect of the great toe that was residual blister. We will transition to [facility] to all wounds, she will continue her Augmentin. If there is  no change or deterioration next week for reculture. 02/08/18-She is here in follow-up evaluation for left great toe ulcer and abdominal ulcer. There is an improvement in both wounds. She has been wrapping her left toe with coban, not by our direction, which has created an area of discoloration to the medial aspect; she has been advised to NOT use coban secondary to her neuropathy. She states her glucose levels have been high over this last week ranging from 200-350, she continues to smoke. She admits to being less compliant with her offloading shoe. We will continue with same treatment plan and she will follow-up next week. 02/15/18-She is here in follow-up evaluation for left great toe ulcer and abdominal ulcer. The abdominal ulcer is epithelialized. The left great toe ulcer is improved and all injury from last week using the Coban wrap is resolved, the lateral ulcer is healed. She admits to noncompliance with wearing offloading shoe and admits to glucose levels being greater than 300 most of the week. She continues to smoke and expresses no desire to quit. There is one area medially that probes deeper than it has historically, erythema to the toe and dorsal foot has consistently waxed and waned. There is no overt signs of cellulitis or infection but we will culture the wound for any occult infection given the new area of depth and erythema. We will hold off on sensitivities for initiation of antibiotic therapy. 02/22/18-She is here in follow up evaluation for left great toe ulcer. There is overall significant improvement in both wound appearance, erythema and edema with changes made last week. She was not initiated on antibiotic therapy. Culture obtained last week showed oxacillin sensitive staph aureus, sensitive to clindamycin. Clindamycin has been called into the pharmacy but she has been instructed to hold  off on initiation secondary to overall clinical improvement and her history of antibiotic  intolerance. She has been instructed to contact the clinic with any noted changes/deterioration and the wound, erythema, edema and/or pain. She will follow-up next week. She continues to smoke and her glucose levels remain elevated >250; she admits to compliance with offloading shoe 03/01/18 on evaluation today patient appears to be doing fairly well in regard to her left first toe ulcer. She has been tolerating the dressing changes with the [facility] Dressing without complication and overall this has definitely showed signs of improvement according to records as well is what the patient tells me today. I'm very pleased in that regard. She is having no pain today 03/08/18 She is here for follow up evaluation of a left great toe ulcer. She remains non-compliant with glucose control and smoking cessation; glucose levels consistently >200. She states that she got new shoe inserts/peg assist. She admits to compliance with offloading. Since my last evaluation there is significant improvement. We will switch to prisma at this time and she will follow up next week. She is noted to be tachycardic at this appointment, heart rate 120s; she has a history of heart rate 70-130 according to our records. She admits to extreme agitation r/t personal issues; she was advised to monitor her heartrate and contact her physician if it does not return to a more normal range (<100). She takes cardizem twice daily. 03/15/18-She is here in follow-up evaluation for left great toe ulcer. She remains noncompliant with glucose control and smoking cessation. She admits to compliance with wearing offloading shoe. The ulcer is improved/stable and we will continue with the same treatment plan and she will follow-up next week 03/22/18-She is here for evaluation for left great toe ulcer. There continues to be significant improvement despite recurrent hyperglycemia (over 500 yesterday) and she continues to smoke. She has been compliant  with offloading and we will continue with same treatment plan and she will follow-up next week. [name], [name] ([id number]) 03/29/18-She is here for evaluation for left great toe ulcer. Despite continuing to smoke and uncontrolled diabetes she continues to improve. She is compliant with offloading shoe. We will continue with the same treatment plan and she will follow-up next week 04/05/18- She is here in follow up evaluation for a left great toe ulcer; she presents with small pustule to left fifth toe (resembles ant bite). She admits to compliance with wearing offloading shoe; continues to smoke or have uncontrolled blood glucose control. There is more callus than usual with evidence of bleeding; she denies known trauma. 04/12/18-She is here for evaluation of left great toe ulcer. Despite noncompliance with glycemic control and smoking she continues to make improvement. She continues to wear offloading shoe. The pustule, that was identified last week, to the left fifth toe is resolved. She will follow-up in 2 weeks 05/03/18-she is seen in follow-up evaluation for a left great toe ulcer. She is compliant with offloading, otherwise noncompliant with glycemic control and smoking. She has plateaued and there is minimal improvement noted. We will transition to [facility], replaced the insert to her surgical shoe and she will follow-up in one week 05/10/18- She is here in follow up evaluation for a left great toe ulcer. It appears stable despite measurement change. We will continue with same treatment plan and follow up next week. 05/24/18-She is seen in follow-up evaluation for a left great toe ulcer. She remains compliant with offloading, has made significant improvement in her  diet, decreasing the amount of sugar/soda. She said her recent A1c was 10.9 which is lower than. She did see a diabetic nutritionist/educator yesterday. She continues to smoke. We will continue with the same treatment plan and  she'll follow-up next week. 05/31/18- She is seen in follow-up evaluation for left great toe ulcer. She continues to remain compliant with offloading, continues to make improvement in her diet, increasing her water and decreasing the amount of sugar/soda. She does continue to smoke with no desire to quit. We will apply Prisma to the depth and Hydrofera Blue over. We have not received insurance authorization for oasis. She will follow up next week. 06/07/18-She is seen in follow-up evaluation for left great toe ulcer. It has stalled according to today's measurements although base appears stable. She says she saw a diabetic educator yesterday; her average blood sugars are less than 300 which is an improvement for her. She continues to smoke and states "that's my next step" She continues with water over soda. We will order for xray, culture and reinstate ace wrap compression prior to placing apligraf for next week. She is voicing no complaints or concerns. Her dressing will change to iodoflex over the next week in preparation for apligraf. 06/14/18-She is seen in follow-up evaluation for left great toe ulcer. Plain film x-ray performed last week was negative for osteomyelitis. Wound culture obtained last week grew strep B and OSSA; she is initiated on keflex and cefdinir today; there is erythema to the toe which could be from ace wrap compression, she has a history of wrapping too tight and has has been encouraged to maintain ace wraps that we place today. We will hold off on application of apligraf today, will apply next week after antibiotic therapy has been initiated. She admits today that she has resumed taking a shower with her foot/toe submerged in water, she has been reminded to keep foot/toe out of the bath water. She will be seen in follow up next week 06/21/18-she is seen in follow-up evaluation for left great toe ulcer. She is tolerating antibiotic therapy with no GI disturbance. The wound  is stable. Apligraf was applied today. She has been decreasing her smoking, only had 4 cigarettes yesterday and 1 today. She continues being more compliant in diabetic diet. She will follow-up next week for evaluation of site, if stable will remove at 2 weeks. 06/28/18- She is here in follow up evalution. Apligraf was placed last week, she states the dressing fell off on Tuesday and she was dressing with hydrofera blue. She is healed and will be discharged from the clinic today. She has been instructed to continue with smoking cessation, continue monitoring glucose levels, offloading for an additional 4 weeks and continue with hydrofera blue for additional two weeks for any possible microscopic opening. Readmission: 08/07/18 on evaluation today patient presents for reevaluation concerning the ulcer of her right great toe. She was previously discharged on 06/28/18 healed. Nonetheless she states that this began to show signs of drainage she subsequently went to her primary care provider. Subsequently an x-ray was performed on 08/01/18 which was negative. The patient was also placed on antibiotics at that time. Fortunately they should have been effective for the infection. Nonetheless she's been experiencing some improvement but still has a lot of drainage coming from the wound itself. 08/14/18 on evaluation today patient's wound actually does show signs of improvement in regard to the erythema at this point. She has completed the antibiotics. With that being said we did  discuss the possibility of placing her in a total contact cast as of today although I think that I may want to give this just a little bit more time to ensure nothing recurrence as far as her infection is concerned. I do not want to put in the cast and risk infection at that time if things are not completely resolved. With that being said she is gonna require some debridement today. 08/21/18 on evaluation today patient actually appears to  be doing okay in regard to her toe ulcer. She's been tolerating the dressing changes without complication. With that being said it does appear that she is ready and in fact I think it's appropriate for [place] to go ahead and initiate the total contact cast today. Nonetheless she will require some sharp debridement to prepare the wound for application. Overall I feel like things have been progressing well but we do need to do something to get this to close more readily. 08/24/18 patient seen today for reevaluation after having had the total contact cast applied on Tuesday. She seems to have done very well the wound appears to be doing great and overall I'm pleased with the progress that she's made. There were no abnormal areas of rubbing from the cast on her lower extremity. 08/30/18 on evaluation today patient actually appears to be completely healed in regard to her plantar toe ulcer. She tells me at this point she's been having a lot of issues with the cast. She almost fell a couple of times the state shall the step of her dog a couple times as well. This is been a very frustrating process for her other nonetheless she has completely healed the wound which is excellent news. Overall there does not appear to be the evidence of infection at this time which is great news. 09/11/18 evaluation today patient presents for follow-up concerning her great toe ulcer on the left which has unfortunately reopened since I last saw her which was only a couple of weeks ago. Unfortunately she was not able to get in to get the shoe and potentially the AFO that's gonna be necessary due to her left foot drop. She continues with offloading shoe but this is not enough to prevent her from reopening it appears. When we last had her in the total contact cast she did well from a healing standpoint but unfortunately the wound reopened as soon as she came out of the cast within just a couple of weeks. 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 This is also part of the reason why she falls on a regular basis. I just do not believe that is gonna be safe for [place] to reinitiate the total contact cast as last time we had this on she fell 3 times one week which is definitely not normal for her. 09/18/18 upon evaluation today the patient actually appears to be doing about the same in regard to her toe ulcer. She did not contact [facility] as I asked her to even though I had given her the prescription. In fact she actually states that she has no idea where the prescription is. She did apparently call [employer] and they told her that all she needed to do was bring the prescription in order to be able to  be seen and work on getting the AFO for her left foot. With all that being said she still does not have an appointment and I'm not sure were things stand that regard. I will give her a new prescription today in order to contact them to get this set up. [name], [name] ([id number]) 09/25/18 on evaluation today patient actually appears to be doing about the same in regard to her toes ulcer. She does have a small areas which seems to have a lot of callous buildup around the edge of the wound which is going to need sharp debridement today. She still is waiting to be scheduled for evaluation with [employer] for possibility of an AFO. She states there supposed to call her tomorrow to get this set up. Unfortunately it does appear that her foot specifically the toe area is showing signs of erythema. There does not appear to be any systemic infection which is in these good news. 10/02/18 on evaluation today patient actually appears to be doing about the same in regard to her toe ulcer. This really has not done too well although it's not significantly larger it's  also not significantly smaller. She has been tolerating the dressing changes without complication. She actually has her appointment with [facility] and [facility] tomorrow to hopefully be measured for obtaining and AFO splint. I think this would be helpful preventing this from reoccurring. We had contemplated starting the cast this week although to be honest I am reluctant to do that as she's been having nausea, vomiting, and seizure activity over the past three days. She has a history of seizures and have been told is nothing that can be done for these. With that being said I do believe that along with the seizures have the nausea vomiting which upon further questioning doesn't seem to be the normal for her and makes me concerned for the possibility of infection or something else going on. I discussed this with the patient and her mother during the office visit today. I do not feel the wound is effective but maybe something else. The responses this was "this just happens to her at times and we don't know why". They did not seem to be interested in going to the hospital to have this checked out further. 10/09/18 on evaluation today patient presents for follow-up concerning her ongoing toe ulcer. She has been tolerating the dressing changes without complication. Fortunately there does not appear to be any evidence of infection which is great news however I do think that the patient would benefit from going ahead for with the total contact cast. She's actually in a wheelchair today she tells me that she will use her walker if we initiate the cast. I was very specific about the fact that if we were gonna do the cast I wanted to make sure that she was using the walker in order to prevent any falls. She tells me she does not have stairs that she has to traverse on a regular basis at her home. She has not had any seizures since last week again that something that happens to her often she tells me she did talk to  [employer] and they said that it may take up to three weeks to get the brace approved for her. Hopefully that will not take that long but nonetheless in the meantime I do think the cast could be of benefit. 10/12/18 on evaluation today patient appears to be doing rather well in regard to her toe ulcer. It's  just been a few days and already this is significantly improved both as far as overall appearance and size. Fortunately there's no sign of infection. She is here for her first obligatory cast change. 10/19/18 Seen today for follow up and management of left great toe ulcer. Wound continues to show improvement. Noted small open area with seroussang drainage with palpation. Denies any increased pain or recent fevers during visit. She will continue calcium alginate with offloading shoe. Denies any questions or concerns during visit. 10/26/18 on evaluation today patient appears to be doing about the same as when I last saw her in regard to her wound bed. Fortunately there does not appear to be any signs of infection. Unfortunately she continues to have a breakdown in regard to the toe region any time that she is not in the cast. It takes almost no time at all for this to happen. Nonetheless she still has not heard anything from the brace being made by [employer] as to when exactly this will be available to her. Fortunately there is no signs of infection at this time. 10/30/18 on evaluation today patient presents for application of the total contact cast as we just received him this morning. Fortunately we are gonna be able to apply this to her today which is great news. She continues to have no significant pain which is good news. Overall I do feel like things have been improving while she was the cast is when she doesn't have a cast that things get worse. She still has not really heard anything from [facility] regarding her brace. 11/02/18 upon evaluation today patient's wound already appears to be doing  significantly better which is good news. Fortunately there does not appear to be any signs of infection also good news. Overall I do think the total contact cast as before is helping to heal this area unfortunately it's just not gonna likely keep the area closed and healed without her getting her brace at least. Again the foot drop is a significant issue for her. 11/09/18 on evaluation today patient appears to be doing excellent in regard to her toe ulcer which in fact is completely healed. Fortunately we finally got the situation squared away with the paperwork which was needed to proceed with getting her brace approved by Medicaid. I have filled that out unfortunately that information has been sent to the orthopedic office that I worked at 2 1/2 years ago and not tired Current wound care measures. Fortunately she seems to be doing very well at this time. 11/23/18 on evaluation today patient appears to be doing More Poorly Compared to Last Time I Saw Her. At [facility] She Had Completely Healed. Currently she is continuing to have issues with reopening. She states that she just found out that the brace was approved through Medicaid now she just has to go get measured in order to have this fitted for her and then made. Subsequently she does not have an appointment for this yet that is going to complicate things we obviously cannot put her back in the cast if we do not have everything measured because they're not gonna be able to measure her foot while she is in the cast. 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 Obviously this is unfortunate and does have me somewhat worried as well. 11/30/18 on evaluation today patient's toe ulcer actually appears to be doing  fairly well. The good news is she will be getting her brace in the shoes next week on Wednesday. Hopefully we will be able to get this to heal  without having to go back in the cast however she may need the cast in order to get the wound completely heal and then go from there. Fortunately there's no signs of infection at this time. 12/07/18 on evaluation today patient fortunately did receive her brace and she states she could tell this definitely makes her walk better. With that being said she's been having issues with her toe where she noticed yesterday there was a lot of tissue that was loosing off this appears to be much larger than what it was previous. She also states that her leg has been read putting much across the top of her foot just about the ankle although this seems to be receiving somewhat. The total area is still red and appears to be someone infected as best I can tell. She is previously taken Bactrim and that may be a good option for her today as well. We are gonna see what I wound culture shows as well and I think that this is definitely appropriate. With that being said outside of the culture I still need to initiate something in the interim and that's what I'm gonna go ahead and select Bactrim is a good option for her. 12/14/18 on evaluation today patient appears to be doing better in regard to her left great toe ulcer as compared to last week's evaluation. There's still some erythema although this is significantly improved which is excellent news. Overall I do believe that she is making good progress is still gonna take some time before she is where I would like her to be from the standpoint of being able to place her back into the total contact cast. Hopefully we will be where we need to be by next week. 12/21/18 on evaluation today patient actually appears to be doing poorly in regard to her toe ulcer. She's been tolerating the dressing changes without complication. Fortunately there's no signs of systemic infection although she does have a lot of drainage from the toe ulcer and this does [name], [name]  ([id number]) seem to be causing some issues at this point. She does have erythema on the distal portion of her toe that appears to be likely cellulitis. 12/28/18 on evaluation today patient actually appears to be doing a little better in my pinion in regard to her toe ulcer. With that being said she still does have some evidence of infection at this time and for her culture she had both E. coli as well as enterococcus as organisms noted on evaluation. For that reason I think that though the Keflex likely has treated the E. coli rather well this has really done nothing for the enterococcus. We are going to have to initiate treatment for this specifically. 01/04/19 on evaluation today patient's toe actually appears to be doing better from the standpoint of infection. She currently would like to see about putting the cash back on I think that this is appropriate as long as she takes care of it and keeps it from getting wet. She is gonna have some drainage we can definitely pass this up with Drawtex and alginate to try to prevent as much drainage as possible from causing the problems. With that being said I do want to at least try her with the cast between now and Tuesday. If there any issues we can't  continue to use it then I will discontinue the use of the cast at that point. 01/08/19 on evaluation today patient actually appears to be doing very well as far as her foot ulcer specifically the great toe on the left is concerned. She did have an area of rubbing on the medial aspect of her left ankle which again is from the cast. Fortunately there's no signs of infection at this point in this appears to be a very slight skin breakdown. The patient tells me she felt it rubbing but didn't think it was that bad. Fortunately there is no signs of active infection at this time which is good news. No fevers, chills, nausea, or vomiting noted at this time. 01/15/19 on evaluation today patient actually appears to be doing  well in regard to her toe ulcer. Again as previous she seems to do well and she has the cast on which indicates to me that during the time she doesn't have a cast on she's putting way too much pressure on this region. Obviously I think that's gonna be an issue as with the current national emergency concerning the Covid-19 Virus it has been recommended that we discontinue the use of total contact casting by the chief medical officer of our company, Dr. Simona Huh. The reasoning is that if a patient becomes sick and cannot come into have the cast removed they could not just leave this on for an additional two weeks. Obviously the hospitals also do not want to receive patient's who are sick into the emergency department to potentially contaminate the region and spread the Covid-19 Virus among other sick individuals within the hospital system. Therefore at this point we are suspending the use of total contact cast until the current emergency subsides. This was all discussed with the patient today as well. 01/22/19 on evaluation today patient's wound on her left great toe appears to be doing slightly worse than previously noted last week. She tells me that she has been on this quite a bit in fact she tells me she's been awake for 38 straight hours. This is due to the fact that she's having to care for grandparents because nobody else will. She has been taking care of them for five the last seven days since I've seen her they both have dementia his is from a stroke and her grandmother's was progressive. Nonetheless she states even her mom who knows her condition and situation has only help two of those days to take care of them she's been taking care of the rest. Fortunately there does not appear to be any signs of active infection in regard to her toe at this point although obviously it doesn't look as good as it did previous. I think this is directly related to her not taking off the pressure and friction by  way of taking things easy. Though I completely understand what's going on. 01/29/19 on evaluation today patient's tools are actually appears to be showing some signs of improvement today compared to last week's evaluation as far as not necessarily the overall size of the wound but the fact that she has some new skin growth in between the two ends of the wound opening. Overall I feel like she has done well she states that she had a family member give her what sounds to be a CAM walker boot which has been helpful as well. 02/05/19 on evaluation today patient's wound bed actually appears to be doing significantly better in regard to her overall appearance  of the size of the wound. With that being said she is still having an issue with offloading efficiently enough to get this to close. Apparently there is some signs of infection at this point as well unfortunately. Previously she's done well of Augmentin I really do not see anything that needs to be culture currently but there theme and cellulitis of the foot that I'm seeing I'm gonna go ahead and place her on an antibiotic today to try to help clear this up. 02/12/2019 on evaluation today patient actually appears to be doing poorly in regard to her overall wound status. She tells me she has been using her offloading shoe but actually comes in today wearing her tennis shoe with the AFO brace. Again as I previously discussed with her this is really not sufficient to allow the area to heal appropriately. Nonetheless she continues to be somewhat noncompliant and I do wonder based on what she has told my nurse in the past as to whether or not a good portion of this noncompliance may be recreational drug and alcohol related. She has had a history of heroin overdose and this was fairly recently in the past couple of months that have been seeing her. Nonetheless overall I feel like her wound looks significantly worse today compared to what it was previous. She  still has significant erythema despite the Augmentin I am not sure that this is an appropriate medication for her infection I am also concerned that the infection may have gone down into her bone. 02/19/19 on evaluation today patient actually appears to be doing about the same in regard to her toe ulcer. Unfortunately she continues to show signs of bone exposure and infection at this point. There does not appear to be any evidence of worsening of the infection but I'm also not really sure that it's getting significantly better. She is on the Augmentin which should be sufficient for the Staphylococcus aureus infection that she has at this point. With that being said she may need IV antibiotics to more appropriately treat this. We did have a discussion today about hyperbaric option therapy. 02/28/19 on evaluation today patient actually appears to be doing much worse in regard to the wound on her left great toe as compared to even my previous evaluation last week. Unfortunately this seems to be training in a pretty poor direction. Her toe was actually now starting to angle laterally and I can actually see the entire joint area of the proximal portion of the digit where is the distal portion of the digit again is no longer even in contact with the joint line. Unfortunately there's a lot more necrotic tissue around the edge and the toe appears to be showing signs of becoming gangrenous in my pinion. I'm very concerned about were things stand at this point. She did see infectious disease and they are planning to send in a prescription for Sivextro for her and apparently this has been approved. With that being said I don't think she should avoid taking this but at the same time I'm not sure that it's gonna be sufficient to save her toe at this point. She tells me that she still having to care for grandparents which I think is putting quite a bit of strain on her foot and specifically the total area and has  caused this to break down even to a greater degree than would've otherwise been expected. 03/05/19 on evaluation today patient actually appears to be doing quite well in regard  to her toe all things considering. She still has bone exposed but there appears to be much less your thing on overall the appearance of the wound and the toe itself is dramatically improved. She still does have some issues currently obviously with infection she did see vascular as well and there concerned that her blood flow to the toad. For that reason they are setting up for an angiogram next week. 03/14/19 on evaluation today patient appears to be doing very poor in regard to her toe and specifically in regard to the ulceration and the fact that she's starting to notice the toe was leaning even more towards the lateral aspect and the complete joint is visible on the proximal aspect of the joint. Nonetheless she's also noted a significant odor and the tip of the toe is turning more dark and necrotic appearing. Overall I think she is getting worse not better as far as this is concerned. For that reason I am recommending at this point that she likely needs to be seen for likely amputation. [name], [name] ([id number]) READMISSION 03/19/2021 This is a patient that we cared for in this clinic for a prolonged period of time in 2019 and 2020 with a left foot and left first toe wound. 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 Luana Shu. In December 2021 she had an ulcer on her right great toe as well as the fourth and fifth toes. 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 She also had an angiogram at that time and underwent angioplasty of the right anterior tibial artery. In any case she claims that the wound on the right foot is closed I did not look at this today which was probably  an oversight although I think that should be done next week. After her surgery she developed a dehiscence but I do not see any follow-up of this. According to Dr. Deborra Medina last review that she was out of the area being cared for by another physician but recently came back to his attention. 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 A culture of this area showed E. coli apparently before she came back to see Dr. Luana Shu she was supposed to be receiving antibiotics but she did not really take them. Nor is she offloading this area at all. Finally her last hemoglobin A1c listed in epic was in March 2022 at 14.1 she says things are a lot better since then although I am not sure. 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 She had this wound at the time. During this admission she had E. coli sepsis a TEE was negative. 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 She is still a heavy cigarette smoker. She has a history of bipolar disorder seizure disorder ABI in our clinic was 1.05 6/1; left midfoot in the setting of a TMA done previously. Round circular wound with a "knuckle" of protruding tissue. The problem is that the knuckle was not attached to any of the surrounding granulation and this probed proximally widely I removed a large portion of this tissue.  This wound goes with considerable undermining laterally. I do not feel any bone there was no purulence but this is a deep wound. 6/8; in spite of the debridement I did last week. She arrives with a wound looking exactly the same. A protruding "knuckle" of tissue nonadherent to most of the surrounding tissue.  There is considerable depth around this from 6-12 o'clock at 2.7 cm and undermining of 1 cm. This does not look overtly infected and the x-ray I did last week was negative for any osseous abnormalities. We have been using silver collagen 6/15; deep tissue culture I did last week showed moderate staph aureus and moderate Pseudomonas. This will definitely require prolonged antibiotic therapy. The pathology on the protuberant area was negative for malignancy fungus etc. 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 We have been using silver collagen. I am going to be prescribing Levaquin for 2 weeks. Her CT scan of the foot is down for 7/5 6/22; CT scan of the foot on 7 5. She says she has hardware in the left leg from her previous fracture. She is on the Levaquin for the deep tissue culture I did that showed methicillin sensitive staph aureus and Pseudomonas. I gave her a 2-week supply and she will have another week. She arrives in clinic today with the same protuberant tissue however this is nonadherent to the tissue surrounding it. I am really at a loss to explain this unless there is underlying deep tissue infection 6/29; patient presents for 1 week follow-up. She has been using collagen to the wound bed. She reports taking her antibiotics as prescribed.She has no complaints or issues today. She denies signs of infection. 7/6; patient presents for one week followup. She has been using collagen to the wound bed. She states she is taking Levaquin however at times she is not able to keep it down. She denies signs of infection. 7/13; patient presents for 1 week follow-up. She has been using silver alginate to the wound bed. She still has nausea when taking her antibiotics. She denies signs of infection. 7/20; patient presents for 1 week follow-up. She has been using silver alginate with gentamicin cream to the wound bed. She denies any issues and  has no complaints today. She denies signs of infection. 7/27; patient presents for 1 week follow-up. She continues to use silver alginate with gentamicin cream to the wound bed. She reports starting her antibiotics. She has no issues or complaints. Overall she reports stability to the wound. 8/3; patient presents for 1 week follow-up. She has been using silver alginate with gentamicin cream to the wound bed. She reports completing all antibiotics. She has no issues or complaints today. She denies signs of infection. 8/17; patient presents for 2-week follow-up. He is to use silver alginate to the wound bed. She has no issues or complaints today. She denies signs of infection. She reports her pain has improved in her foot since last clinic visit 8/24; patient presents for 1 week follow-up. She continues to use silver alginate to the wound bed. She has no issues or complaints. She denies signs of infection. Pain is stable. 9/7; patient presents for follow-up. She missed her last week appointment due to feeling ill. She continues to use silver alginate. She has a new wound to the right lower extremity that is covered in eschar. She states It occurred over the past week and has no idea how it started. She currently denies signs of  infection. 9/14; patient presents for follow-up. To the left foot wound she has been using gentamicin cream and silver alginate. To the right lower extremity wound she has been keeping this covered and has not obtain Santyl. 9/21; patient presents for follow-up. She reports using gentamicin cream and silver alginate to the left foot and Santyl to the right lower extremity wound. She has no issues or complaints today. She denies signs of infection. 9/28; patient presents for follow-up. She reports a new wound to her right heel. She states this occurred a few days ago and is progressively gotten worse. She has been trying to clean the area with a Q-tip and Santyl. She reports  stability in the other 2 wounds. She has been using gentamicin cream and silver alginate to the left foot and Santyl to the right lower extremity wound. 10/12; patient presents for follow-up. She reports improvement to the wound beds. She is seeing vein and vascular to discuss the potential of a left BKA. She states they are going to do an arteriogram. She continues to use silver alginate with dressing changes to her wounds. 11/2; patient presents for follow-up. She states she has not been doing dressing changes to the wound beds. She states she is not able to offload the areas. She reports chronic pain to her left foot wound. 11/9; patient presents for follow-up. She came in with only socks on. She states she forgot to put on shoes. It is unclear if she is doing any dressing changes. She currently denies systemic signs of infection. 11/16; patient presents for follow-up. She came again only with socks on. She states she does not wear shoes ever. It is unclear if she does dressing changes. She currently denies systemic signs of infection. 11/23; patient presents for follow-up. She wore her shoes today. It still unclear exactly what dressing she is using for each wound but she did states she obtained Dakin's solution and has been using this to the left foot wound. She currently denies signs of infection. [name], ASQUITH ([id number]) 11/30; patient presents for follow-up. She has no issues or complaints today. She currently denies signs of infection. 12/7; patient presents for follow-up. She has no issues or complaints today. She has been using Hydrofera Blue to the right heel wound and Dakin solution to the left foot wound. Her right anterior leg wound is healed. She currently denies signs of infection. 12/14; patient presents for follow-up. She has been using Hydrofera Blue to the right heel and Dakin's to the left foot wounds. She has no issues or complaints today. She denies signs of  infection. 12/21; patient presents for follow-up. She reports using Hydrofera Blue to the right heel and Dakin's to the left foot wound. She denies signs of infection. Objective Constitutional Vitals Time Taken: 10:52 AM, Height: 69 in, Weight: 185 lbs, BMI: 27.3, Temperature: 98.1 F, Pulse: 76 bpm, Respiratory Rate: 18 breaths/min, Blood Pressure: 94/66 mmHg. General Notes: pt states asymptomatic with lower BP General Notes: Left foot (Transmetatarsal amputation): Wound to the plantar aspect that has granulation tissue With scant nonviable tissue and increased pockets of depth. Areas of hypergranulation. There is circumferential callus. Right foot: To the heel there is an open wound with Granulation tissue , Nonviable tissue andCircumferential callus. No obvious signs of infection to any wound. Integumentary (Hair, Skin) Wound #7 status is Open. Original cause of wound was Gradually Appeared. The date acquired was: 08/07/2020. The wound has been in treatment 29 weeks. The wound is located on  the [place]. The wound measures 4.6cm length x 2.7cm width x 0.4cm depth; 9.755cm^2 area and 3.902cm^3 volume. There is Fat Layer (Subcutaneous Tissue) exposed. There is no tunneling or undermining noted. There is a large amount of serosanguineous drainage noted. The wound margin is thickened. There is large (67-100%) pink, hyper - granulation within the wound bed. There is a small (1-33%) amount of necrotic tissue within the wound bed including Adherent Slough. Wound #9 status is Open. Original cause of wound was Gradually Appeared. The date acquired was: 07/18/2021. The wound has been in treatment 12 weeks. The wound is located on the Right Calcaneus. The wound measures 0.3cm length x 0.4cm width x 0.2cm depth; 0.094cm^2 area and 0.019cm^3 volume. There is Fat Layer (Subcutaneous Tissue) exposed. There is a medium amount of serosanguineous drainage noted. There is small (1-33%) pale  granulation within the wound bed. There is a large (67-100%) amount of necrotic tissue within the wound bed including Eschar. 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 I debrided nonviable tissue. No signs of infection on exam. I used silver nitrate to the hyper granulated areas. I recommended continuing Dakin's wet-to-dry dressings to the left foot wound and Hydrofera Blue to the right heel wound. I recommended aggressive offloading. Follow-up in 1 week Procedures Wound #7 Pre-procedure diagnosis of Wound #7 is a Diabetic Wound/Ulcer of the Lower Extremity located on the [place] .Severity of Tissue Pre Debridement is: Fat layer exposed. There was a Excisional Skin/Subcutaneous Tissue Debridement with a total area of 12.42 sq cm performed Koestner, Deshauna A. ([id number]) by Kalman Shan, MD. With the following instrument(s): Curette to remove Viable and Non-Viable tissue/material. Material removed includes Subcutaneous Tissue after achieving pain control using Lidocaine 4% Topical Solution. No specimens were taken. A time out was conducted at 11:24, prior to the start of the procedure. A Minimum amount of bleeding was controlled with Silver Nitrate. The procedure was tolerated well. Post Debridement Measurements: 4.6cm length x 2.7cm width x 0.4cm depth; 3.902cm^3 volume. Character of Wound/Ulcer Post Debridement is stable. Severity of Tissue Post Debridement is: Fat layer exposed. Post procedure Diagnosis Wound #7: Same as Pre-Procedure Wound #9 Pre-procedure diagnosis of Wound #9 is a Diabetic Wound/Ulcer of the  Lower Extremity located on the Right Calcaneus .Severity of Tissue Pre Debridement is: Fat layer exposed. There was a Excisional Skin/Subcutaneous Tissue Debridement with a total area of 0.12 sq cm performed by Kalman Shan, MD. With the following instrument(s): Curette to remove Non-Viable tissue/material. Material removed includes Callus, Subcutaneous Tissue, and Slough after achieving pain control using Lidocaine 4% Topical Solution. No specimens were taken. A time out was conducted at 11:22, prior to the start of the procedure. A Minimum amount of bleeding was controlled with Pressure. The procedure was tolerated well. Post Debridement Measurements: 0.8cm length x 1cm width x 0.5cm depth; 0.314cm^3 volume. Character of Wound/Ulcer Post Debridement is stable. Severity of Tissue Post Debridement is: Fat layer exposed. Post procedure Diagnosis Wound #9: Same as Pre-Procedure Plan Follow-up Appointments: Return Appointment in 1 week. Bathing/ Shower/ Hygiene: Wash wounds with antibacterial soap and water. Off-Loading: Open toe surgical shoe with peg assist. Turn and reposition every 2 hours  Other: - peg shoe right, off-loader left Medications-Please add to medication list.: P.O. Antibiotics WOUND #7: - Foot Wound Laterality: Plantar, Left, Midline Cleanser: Dakin 16 (oz) 0.25 1 x Per Day/30 Days Discharge Instructions: Use as directed. Primary Dressing: Gauze 1 x Per Day/30 Days Discharge Instructions: As directed: moistened with Dakins Solution Secured With: Kerlix Roll Sterile or Non-Sterile 6-ply 4.5x4 (yd/yd) 1 x Per Day/30 Days Discharge Instructions: Apply Kerlix as directed WOUND #9: - Calcaneus Wound Laterality: Right Cleanser: Soap and Water 1 x Per Day/30 Days Discharge Instructions: Gently cleanse wound with antibacterial soap, rinse and pat dry prior to dressing wounds Primary Dressing: Hydrofera Blue Ready Transfer Foam, 2.5x2.5 (in/in) 1 x Per Day/30 Days Discharge  Instructions: Apply Hydrofera Blue Ready to wound bed as directed Secondary Dressing: Bordered Gauze Sterile-HBD 4x4 (in/in) 1 x Per Day/30 Days Discharge Instructions: Cover wound with Bordered Guaze Sterile as directed Secured With: coverlet 1 x Per Day/30 Days 1. In office sharp debridement 2. Dakin's wet-to-dry dressings to the left foot wound 3. Hydrofera Blue to the right heel wound 4. Follow-up in 1 week Electronic Signature(s) Signed: 10/13/2021 11:56:40 AM By: Kalman Shan DO Entered By: Kalman Shan on 10/13/2021 11:53:42 Zion, Khristian A. ([id number]) -------------------------------------------------------------------------------- SuperBill Details Patient Name: Heather Dear A. Date of Service: 10/13/2021 Medical Record Number: [id number] Patient Account Number: [id number] Date of Birth/Sex: 1976-08-11 (45 y.o. F) Treating RN: Levora Dredge Primary Care Provider: Raelene Bott Other Clinician: Referring Provider: Raelene Bott Treating Provider/Extender: Yaakov Guthrie in Treatment: 29 Diagnosis Coding ICD-10 Codes Code Description [phone] 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ity L97.512 Non-pressure chronic ulcer of other part of right foot with fat layer exposed L97.812 Non-pressure chronic ulcer of other part of right lower leg with fat layer exposed E11.621 Type 2 diabetes mellitus with foot ulcer E11.42 Type 2 diabetes mellitus with diabetic polyneuropathy Facility Procedures CPT4 Code: 24497530 Description: 05110 - DEB SUBQ TISSUE 20 SQ CM/< Modifier: Quantity: 1 CPT4 Code: Description: ICD-10 Diagnosis Description 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 E11.621 Type 2 diabetes mellitus with foot ulcer Modifier: severity Quantity: Physician Procedures CPT4 Code: 2111735 Description: 11042 - WC PHYS SUBQ TISS 20 SQ CM Modifier: Quantity: 1 CPT4  Code: Description: ICD-10 Diagnosis Description 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 E11.621 Type 2 diabetes mellitus with foot ulcer Modifier: severity Quantity: Electronic Signature(s) Signed: 10/13/2021 11:56:40 AM By: Kalman Shan DO Entered By: Kalman Shan on 10/13/2021 11:54:42

## 2021-10-13 NOTE — Progress Notes (Signed)
[name] ([id number]) Visit Report for 10/13/2021 Arrival Information Details Patient Name: [name], [name] Date of Service: 10/13/2021 10:30 AM Medical Record Number: [id number] Patient Account Number: [id number] Date of Birth/Sex: 08-15-1976 (45 y.o. F) Treating RN: [name] Primary Care [name]: [name] Other Clinician: Referring [name]: [name] Treating [name]/Extender: [name] in Treatment: 29 Visit Information History Since Last Visit Added or deleted any medications: No Patient Arrived: Wheel Chair Any new allergies or adverse reactions: No Arrival Time: 10:47 Had a fall or experienced change in No Accompanied [name]: mother activities of daily living that may affect Transfer Assistance: EasyPivot Patient Lift risk of falls: Patient Identification Verified: Yes Hospitalized since last visit: No Secondary Verification Process Completed: Yes Has Dressing in Place as Prescribed: Yes Patient Requires Transmission-Based No Has Footwear/Offloading in Place as Prescribed: Yes Precautions: Pain Present Now: Yes Patient Has Alerts: Yes Patient Alerts: Patient on Blood Thinner 40m apirin Diabetic Type II Electronic Signature(s) Signed: 10/13/2021 12:56:33 PM [name]: [name]: Heather Dredgeon 10/13/2021 10:52:17 [name]([id number] -------------------------------------------------------------------------------- Clinic Level of Care Assessment Details Patient Name: Heather DearA. Date of Service: 10/13/2021 10:30 AM Medical Record Number: [id number] Account Number: [id number] of Birth/Sex: 908-31-1977(45y.o. F) Treating RN: Heather DredgePrimary Care [name]: HRaelene BottOther Clinician: Referring [name]: HRaelene BottTreating [name]/Extender: HYaakov Guthriein Treatment: 29 Clinic Level of Care Assessment Items TOOL 1 Quantity Score []  - Use when EandM and  Procedure is performed on INITIAL visit 0 ASSESSMENTS - Nursing Assessment / Reassessment []  - General Physical Exam (combine w/ comprehensive assessment (listed just below) when performed on new 0 pt. evals) []  - 0 Comprehensive Assessment (HX, ROS, Risk Assessments, Wounds Hx, etc.) ASSESSMENTS - Wound and Skin Assessment / Reassessment []  - Dermatologic / Skin Assessment (not related to wound area) 0 ASSESSMENTS - Ostomy and/or Continence Assessment and Care []  - Incontinence Assessment and Management 0 []  - 0 Ostomy Care Assessment and Management (repouching, etc.) PROCESS - Coordination of Care []  - Simple Patient / Family Education for ongoing care 0 []  - 0 Complex (extensive) Patient / Family Education for ongoing care []  - 0 Staff obtains CProgrammer, systems Records, Test Results / Process Orders []  - 0 Staff telephones HHA, Nursing Homes / Clarify orders / etc []  - 0 Routine Transfer to another Facility (non-emergent condition) []  - 0 Routine Hospital Admission (non-emergent condition) []  - 0 New Admissions / IBiomedical engineer/ Ordering NPWT, Apligraf, etc. []  - 0 Emergency Hospital Admission (emergent condition) PROCESS - Special Needs []  - Pediatric / Minor Patient Management 0 []  - 0 Isolation Patient Management []  - 0 Hearing / Language / Visual special needs []  - 0 Assessment of Community assistance (transportation, D/C planning, etc.) []  - 0 Additional assistance / Altered mentation []  - 0 Support Surface(s) Assessment (bed, cushion, seat, etc.) INTERVENTIONS - Miscellaneous []  - External ear exam 0 []  - 0 Patient Transfer (multiple staff / HCivil Service fast streamer/ Similar devices) []  - 0 Simple Staple / Suture removal (25 or less) []  - 0 Complex Staple / Suture removal (26 or more) []  - 0 Hypo/Hyperglycemic Management (do not check if billed separately) []  - 0 Ankle / Brachial Index (ABI) - do not check if billed separately Has the patient been seen at the  hospital within the last three years: Yes Total Score: 0 Level Of Care: ____ [name]([id number] Electronic Signature(s) Signed: 10/13/2021 12:56:33 PM [name]: [name]  Entered [name]: [name] on 10/13/2021 11:40:55 [name] ([id number]) -------------------------------------------------------------------------------- Encounter Discharge Information Details Patient Name: [name], [name] Date of Service: 10/13/2021 10:30 AM Medical Record Number: [id number] Patient Account Number: [id number] Date of Birth/Sex: 22-Jun-1976 (45 y.o. F) Treating RN: [name] Primary Care [name]: [name] Other Clinician: Referring [name]: [name] Treating [name]/Extender: [name] in Treatment: 29 Encounter Discharge Information Items Post Procedure Vitals Discharge Condition: Stable Temperature (F): 98.1 Ambulatory Status: Wheelchair Pulse (bpm): 76 Discharge Destination: Home Respiratory Rate (breaths/min): 18 Transportation: Private Auto Blood Pressure (mmHg): 94/66 Accompanied [name]: mother Schedule Follow-up Appointment: Yes Clinical Summary of Care: Electronic Signature(s) Signed: 10/13/2021 12:56:33 PM [name]: [name] Entered [name]: [name] on 10/13/2021 11:42:53 [name], [name] ([id number]) -------------------------------------------------------------------------------- Lower Extremity Assessment Details Patient Name: Heather Dear A. Date of Service: 10/13/2021 10:30 AM Medical Record Number: [id number] Patient Account Number: [id number] Date of Birth/Sex: 1976/08/20 (45 y.o. F) Treating RN: [name] Primary Care Maxime Beckner: [name] Other Clinician: Referring Leobardo Granlund: [name] Treating Eleni Frank/Extender: [name] in Treatment: 29 Edema Assessment Assessed: [Left: No] [Right: No] Edema: [Left: No] [Right: No] Vascular Assessment Pulses: Dorsalis Pedis Palpable: [Left:Yes]  [Right:Yes] Electronic Signature(s) Signed: 10/13/2021 12:56:33 PM [name]: [name] Entered [name]: [name] on 10/13/2021 11:10:03 [name], [name] ([id number]) -------------------------------------------------------------------------------- Multi Wound Chart Details Patient Name: Heather Dear A. Date of Service: 10/13/2021 10:30 AM Medical Record Number: [id number] Patient Account Number: [id number] Date of Birth/Sex: 14-Nov-1975 (45 y.o. F) Treating RN: [name] Primary Care Shamond Skelton: [name] Other Clinician: Referring Zamyia Gowell: [name] Treating Ellicia Alix/Extender: [name] in Treatment: 29 Vital Signs Height(in): 27 Pulse(bpm): 21 Weight(lbs): 185 Blood Pressure(mmHg): 94/66 Body Mass Index(BMI): 27 Temperature(F): 98.1 Respiratory Rate(breaths/min): 18 Photos: [N/A:N/A] Wound Location: Left, Midline, Plantar Foot Right Calcaneus N/A Wounding Event: Gradually Appeared Gradually Appeared N/A Primary Etiology: Diabetic Wound/Ulcer of the Lower Diabetic Wound/Ulcer of the Lower N/A Extremity Extremity Comorbid History: Chronic sinus problems/congestion, Chronic sinus problems/congestion, N/A Middle ear problems, Anemia, Middle ear problems, Anemia, Chronic Obstructive Pulmonary Chronic Obstructive Pulmonary Disease (COPD), Congestive Heart Disease (COPD), Congestive Heart Failure, Type II Diabetes, End Stage Failure, Type II Diabetes, End Stage Renal Disease, History of pressure Renal Disease, History of pressure wounds, Neuropathy wounds, Neuropathy Date Acquired: 08/07/2020 07/18/2021 N/A Weeks of Treatment: 29 12 N/A Wound Status: Open Open N/A Measurements L x W x D (cm) 4.6x2.7x0.4 0.3x0.4x0.2 N/A Area (cm) : 9.755 0.094 N/A Volume (cm) : 3.902 0.019 N/A % Reduction in Area: -265.40% 71.50% N/A % Reduction in Volume: -192.30% 91.80% N/A Classification: Grade 2 Grade 1 N/A Exudate Amount: Large Medium N/A Exudate Type:  Serosanguineous Serosanguineous N/A Exudate Color: red, brown red, brown N/A Wound Margin: Thickened N/A N/A Granulation Amount: Large (67-100%) Small (1-33%) N/A Granulation Quality: Pink, Hyper-granulation Pale N/A Necrotic Amount: Small (1-33%) Large (67-100%) N/A Necrotic Tissue: Adherent Slough Eschar N/A Exposed Structures: Fat Layer (Subcutaneous Tissue): Fat Layer (Subcutaneous Tissue): N/A Yes Yes Fascia: No Fascia: No Tendon: No Tendon: No Muscle: No Muscle: No Joint: No Joint: No Bone: No Bone: No Epithelialization: None None N/A Treatment Notes Electronic Signature(s) KEMIYA, BATDORF ([id number]) Signed: 10/13/2021 12:56:33 PM [name]: [name] Entered [name]: [name] on 10/13/2021 11:21:07 [name] ([id number]) -------------------------------------------------------------------------------- [facility] Details Patient Name: Heather Dear A. Date of Service: 10/13/2021 10:30 AM Medical Record Number: [id number] Patient Account Number: [id number] Date of Birth/Sex: 1976-04-21 (45 y.o. F) Treating RN: [name] Primary Care Yovan Leeman: [name] Other Clinician:  Referring Yoav Okane: [name] Treating Unknown Schleyer/Extender: [name] in Treatment: 29 Active Inactive Necrotic Tissue Nursing Diagnoses: Impaired tissue integrity related to necrotic/devitalized tissue Knowledge deficit related to management of necrotic/devitalized tissue Goals: Necrotic/devitalized tissue will be minimized in the wound bed Date Initiated: 04/07/2021 Target Resolution Date: 10/08/2021 Goal Status: Active Patient/caregiver will verbalize understanding of reason and process for debridement of necrotic tissue Date Initiated: 04/07/2021 Date Inactivated: 09/22/2021 Target Resolution Date: 09/08/2021 Goal Status: Met Interventions: Assess patient pain level pre-, during and post procedure and prior to discharge Provide education  on necrotic tissue and debridement process Treatment Activities: Excisional debridement : 04/07/2021 Notes: Electronic Signature(s) Signed: 10/13/2021 12:56:33 PM [name]: [name] Entered [name]: [name] on 10/13/2021 11:20:49 Mccarey, Edeline A. ([id number]) -------------------------------------------------------------------------------- Pain Assessment Details Patient Name: Heather Dear A. Date of Service: 10/13/2021 10:30 AM Medical Record Number: [id number] Patient Account Number: [id number] Date of Birth/Sex: 1976/03/03 (45 y.o. F) Treating RN: [name] Primary Care Jaydeen Odor: [name] Other Clinician: Referring Jowel Waltner: [name] Treating Lazara Grieser/Extender: [name] in Treatment: 29 Active Problems Location of Pain Severity and Description of Pain Patient Has Paino Yes Site Locations Rate the pain. Current Pain Level: 7 Pain Management and Medication Current Pain Management: Electronic Signature(s) Signed: 10/13/2021 12:56:33 PM [name]: [name] Entered [name]: [name] on 10/13/2021 10:56:16 [name] ([id number]) -------------------------------------------------------------------------------- Patient/Caregiver Education Details Patient Name: Heather Dear A. Date of Service: 10/13/2021 10:30 AM Medical Record Number: [id number] Patient Account Number: [id number] Date of Birth/Gender: [birth year] (45 y.o. F) Treating RN: [name] Primary Care Physician: [name] Other Clinician: Referring Physician: Raelene [name] Treating Physician/Extender: [name] in Treatment: 8 Education Assessment Education Provided To: Patient and Caregiver Education Topics Provided Wound Debridement: Handouts: Wound Debridement Methods: Explain/Verbal Responses: State content correctly Wound/Skin Impairment: Handouts: Caring for Your Ulcer Methods: Explain/Verbal Responses: State content correctly Electronic  Signature(s) Signed: 10/13/2021 12:56:33 PM [name]: [name] Entered [name]: [name] on 10/13/2021 11:41:47 Dhillon, Etoy A. ([id number]) -------------------------------------------------------------------------------- Wound Assessment Details Patient Name: Heather Dear A. Date of Service: 10/13/2021 10:30 AM Medical Record Number: [id number] Patient Account Number: [id number] Date of Birth/Sex: 1975-12-23 (45 y.o. F) Treating RN: [name] Primary Care Lillian Tigges: [name] Other Clinician: Referring Nevada Mullett: [name] Treating Mayci Haning/Extender: [name] in Treatment: 29 Wound Status Wound Number: 7 Primary Diabetic Wound/Ulcer of the Lower Extremity Etiology: Wound Location: Left, Midline, Plantar Foot Wound Open Wounding Event: Gradually Appeared Status: Date Acquired: 08/07/2020 Comorbid Chronic sinus problems/congestion, Middle ear problems, Weeks Of Treatment: 29 History: Anemia, Chronic Obstructive Pulmonary Disease (COPD), Clustered Wound: No Congestive Heart Failure, Type II Diabetes, End Stage Renal Disease, History of pressure wounds, Neuropathy Photos Wound Measurements Length: (cm) 4.6 Width: (cm) 2.7 Depth: (cm) 0.4 Area: (cm) 9.755 Volume: (cm) 3.902 % Reduction in Area: -265.4% % Reduction in Volume: -192.3% Epithelialization: None Tunneling: No Undermining: No Wound Description Classification: Grade 2 Wound Margin: Thickened Exudate Amount: Large Exudate Type: Serosanguineous Exudate Color: red, brown Foul Odor After Cleansing: No Slough/Fibrino Yes Wound Bed Granulation Amount: Large (67-100%) Exposed Structure Granulation Quality: Pink, Hyper-granulation Fascia Exposed: No Necrotic Amount: Small (1-33%) Fat Layer (Subcutaneous Tissue) Exposed: Yes Necrotic Quality: Adherent Slough Tendon Exposed: No Muscle Exposed: No Joint Exposed: No Bone Exposed: No Treatment Notes Wound #7 (Foot) Wound  Laterality: Plantar, Left, Midline Cleanser Dakin 16 (oz) 0.25 Discharge Instruction: Use as directed. DAJIAH, KOOI A. ([id number]) Peri-Wound Care Topical Primary Dressing Gauze Discharge Instruction: As directed: moistened with Dakins Solution Secondary Dressing  Secured With [employer] or Non-Sterile 6-ply 4.5x4 (yd/yd) Discharge Instruction: Apply Kerlix as directed Compression Wrap Compression Stockings Add-Ons Electronic Signature(s) Signed: 10/13/2021 12:56:33 PM [name]: [name] Entered [name]: [name] on 10/13/2021 11:07:19 Gura, Cherise A. ([id number]) -------------------------------------------------------------------------------- Wound Assessment Details Patient Name: Heather Dear A. Date of Service: 10/13/2021 10:30 AM Medical Record Number: [id number] Patient Account Number: [id number] Date of Birth/Sex: 10-24-1976 (45 y.o. F) Treating RN: [name] Primary Care Arleta Ostrum: [name] Other Clinician: Referring Briant Angelillo: [name] Treating Theresea Trautmann/Extender: [name] in Treatment: 29 Wound Status Wound Number: 9 Primary Diabetic Wound/Ulcer of the Lower Extremity Etiology: Wound Location: Right Calcaneus Wound Open Wounding Event: Gradually Appeared Status: Date Acquired: 07/18/2021 Comorbid Chronic sinus problems/congestion, Middle ear problems, Weeks Of Treatment: 12 History: Anemia, Chronic Obstructive Pulmonary Disease (COPD), Clustered Wound: No Congestive Heart Failure, Type II Diabetes, End Stage Renal Disease, History of pressure wounds, Neuropathy Photos Wound Measurements Length: (cm) 0.3 Width: (cm) 0.4 Depth: (cm) 0.2 Area: (cm) 0.094 Volume: (cm) 0.019 % Reduction in Area: 71.5% % Reduction in Volume: 91.8% Epithelialization: None Wound Description Classification: Grade 1 Exudate Amount: Medium Exudate Type: Serosanguineous Exudate Color: red, brown Foul Odor After Cleansing:  No Slough/Fibrino Yes Wound Bed Granulation Amount: Small (1-33%) Exposed Structure Granulation Quality: Pale Fascia Exposed: No Necrotic Amount: Large (67-100%) Fat Layer (Subcutaneous Tissue) Exposed: Yes Necrotic Quality: Eschar Tendon Exposed: No Muscle Exposed: No Joint Exposed: No Bone Exposed: No Treatment Notes Wound #9 (Calcaneus) Wound Laterality: Right Cleanser Soap and Water Discharge Instruction: Gently cleanse wound with antibacterial soap, rinse and pat dry prior to dressing wounds Peri-Wound Care Somes, Tyara A. ([id number]) Topical Primary Dressing Hydrofera Blue Ready Transfer Foam, 2.5x2.5 (in/in) Discharge Instruction: Apply Hydrofera Blue Ready to wound bed as directed Secondary Dressing Bordered Gauze Sterile-HBD 4x4 (in/in) Discharge Instruction: Cover wound with Bordered Guaze Sterile as directed Secured With coverlet Compression Wrap Compression Stockings Add-Ons Electronic Signature(s) Signed: 10/13/2021 12:56:33 PM [name]: [name] Entered [name]: [name] on 10/13/2021 11:08:43 [name] ([id number]) -------------------------------------------------------------------------------- Vitals Details Patient Name: Heather Dear A. Date of Service: 10/13/2021 10:30 AM Medical Record Number: [id number] Patient Account Number: [id number] Date of Birth/Sex: 11/28/1975 (45 y.o. F) Treating RN: [name] Primary Care Deserai Cansler: [name] Other Clinician: Referring Pheonix Clinkscale: [name] Treating Angeles Paolucci/Extender: [name] in Treatment: 29 Vital Signs Time Taken: 10:52 Temperature (F): 98.1 Height (in): 69 Pulse (bpm): 76 Weight (lbs): 185 Respiratory Rate (breaths/min): 18 Body Mass Index (BMI): 27.3 Blood Pressure (mmHg): 94/66 Reference Range: 80 - 120 mg / dl Notes pt states asymptomatic with lower BP Electronic Signature(s) Signed: 10/13/2021 12:56:33 PM [name]: [name] Entered [name]: [name] on 10/13/2021 10:56:43

## 2021-10-20 ENCOUNTER — Other Ambulatory Visit: Payer: Self-pay

## 2021-10-20 ENCOUNTER — Encounter (HOSPITAL_BASED_OUTPATIENT_CLINIC_OR_DEPARTMENT_OTHER): Payer: Medicaid Other | Admitting: Internal Medicine

## 2021-10-20 DIAGNOSIS — L97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ity: Secondary | ICD-10-CM

## 2021-10-20 DIAGNOSIS — E11621 Type 2 diabetes mellitus with foot ulcer: Secondary | ICD-10-CM | POA: Diagnosis not present

## 2021-10-20 NOTE — Progress Notes (Signed)
[name] ([id number]) Visit Report for 10/20/2021 Chief Complaint Document Details Patient Name: [name], [name] Date of Service: 10/20/2021 10:30 AM Medical Record Number: [id number] Patient Account Number: [id number] Date of Birth/Sex: 09-04-1976 (45 y.o. F) Treating RN: Levora Dredge Primary Care Provider: Raelene Bott Other Clinician: Referring Provider: Raelene Bott Treating Provider/Extender: Yaakov Guthrie in Treatment: 30 Information Obtained from: Patient Chief Complaint 03/19/2021; patient referred by Dr. Luana Shu who has been looking after her left foot for quite a period of time for review of a nonhealing area in the left midfoot Electronic Signature(s) Signed: 10/20/2021 11:28:43 AM By: Kalman Shan DO Entered By: Kalman Shan on 10/20/2021 11:21:55 Scalzo, Jeilani A. ([id number]) -------------------------------------------------------------------------------- HPI Details Patient Name: [name] A. Date of Service: 10/20/2021 10:30 AM Medical Record Number: [id number] Patient Account Number: [id number] Date of Birth/Sex: [birth year] (45 y.o. F) Treating RN: Levora Dredge Primary Care Provider: Raelene Bott Other Clinician: Referring Provider: Raelene Bott Treating Provider/Extender: Yaakov Guthrie in Treatment: 30 History of Present Illness HPI Description: 01/18/18-She is here for initial evaluation of the left great toe ulcer. She is a poor historian in regards to timeframe in detail. She states approximately 4 weeks ago she lacerated her toe on something in the house. She followed up with her primary care who placed her on Bactrim and ultimately a second dose of Bactrim prior to coming to wound clinic. She states she has been treating the toe with peroxide, Betadine and a Band-Aid. She did not check her blood sugar this morning but checked it yesterday morning it was 327; she is unaware of a recent A1c and there are no current  records. She saw Dr. she would've orthopedics last week for an old injury to the left ankle, she states he did not see her toe, nor did she bring it to his attention. She smokes approximately 1 pack cigarettes a day. Her social situation is concerning, she arrives this morning with her mother who appears extremely intoxicated/under the influence; her mother was asked to leave the room and be monitored by the patient's grandmother. The patient's aunt then accompanied the patient and the room throughout the rest of the appointment. We had a lengthy discussion regarding the deleterious effects of uncontrolled hyperglycemia and smoking as it relates to wound healing and overall health. She was strongly encouraged to decrease her smoking and get her diabetes under better control. She states she is currently on a diet and has cut down her [facility] consumption. The left toe is erythematous, macerated and slightly edematous with malodor present. The edema in her left foot is below her baseline, there is no erythema streaking. We will treat her with Santyl, doxycycline; we have ordered and xray, culture and provided a Peg assist surgical shoe and cultured the wound. 01/25/18-She is here in follow-up evaluation for a left great toe ulcer and presents with an abscess to her suprapubic area. She states her blood sugars remain elevated, feeling "sick" and if levels are below 250, but she is trying. She has made no attempt to decrease her smoking stating that we "can't take away her food in her cigarettes". She has been compliant with offloading using the PEG assist you. She is using Santyl daily. the culture obtained last week grew staph aureus and Enterococcus faecalis; continues on the doxycycline and Augmentin was added on Monday. The suprapubic area has erythema, no femoral variation, purple discoloration, minimal induration, was accessed with a cotton tip applicator with sanguinopurulent drainage, this was  cultured, I suspect the current antibiotic treatment will cover and we will not add anything to her current treatment plan. She was advised to go to urgent care or ER with any change in redness, induration or fever. 02/01/18-She is here in follow-up evaluation for left great toe ulcers and a new abdominal abscess from last week. She was able to use packing until earlier this week, where she "forgot it was there". She states she was feeling ill with GI symptoms last week and was not taking her antibiotic. She states her glucose levels have been predominantly less than 200, with occasional levels between 200-250. She thinks this was contributing to her GI symptoms as they have resolved without intervention. There continues to be significant laceration to left toe, otherwise it clinically looks stable/improved. There is now less superficial opening to the lateral aspect of the great toe that was residual blister. We will transition to [facility] to all wounds, she will continue her Augmentin. If there is no change or deterioration next week for reculture. 02/08/18-She is here in follow-up evaluation for left great toe ulcer and abdominal ulcer. There is an improvement in both wounds. She has been wrapping her left toe with coban, not by our direction, which has created an area of discoloration to the medial aspect; she has been advised to NOT use coban secondary to her neuropathy. She states her glucose levels have been high over this last week ranging from 200-350, she continues to smoke. She admits to being less compliant with her offloading shoe. We will continue with same treatment plan and she will follow-up next week. 02/15/18-She is here in follow-up evaluation for left great toe ulcer and abdominal ulcer. The abdominal ulcer is epithelialized. The left great toe ulcer is improved and all injury from last week using the Coban wrap is resolved, the lateral ulcer is healed. She admits to  noncompliance with wearing offloading shoe and admits to glucose levels being greater than 300 most of the week. She continues to smoke and expresses no desire to quit. There is one area medially that probes deeper than it has historically, erythema to the toe and dorsal foot has consistently waxed and waned. There is no overt signs of cellulitis or infection but we will culture the wound for any occult infection given the new area of depth and erythema. We will hold off on sensitivities for initiation of antibiotic therapy. 02/22/18-She is here in follow up evaluation for left great toe ulcer. There is overall significant improvement in both wound appearance, erythema and edema with changes made last week. She was not initiated on antibiotic therapy. Culture obtained last week showed oxacillin sensitive staph aureus, sensitive to clindamycin. Clindamycin has been called into the pharmacy but she has been instructed to hold off on initiation secondary to overall clinical improvement and her history of antibiotic intolerance. She has been instructed to contact the clinic with any noted changes/deterioration and the wound, erythema, edema and/or pain. She will follow-up next week. She continues to smoke and her glucose levels remain elevated >250; she admits to compliance with offloading shoe 03/01/18 on evaluation today patient appears to be doing fairly well in regard to her left first toe ulcer. She has been tolerating the dressing changes with the [facility]ressing without complication and overall this has definitely showed signs of improvement according to records as well is what the patient tells me today. I'm very pleased in that regard. She is having no pain today 03/08/18 She  is here for follow up evaluation of a left great toe ulcer. She remains non-compliant with glucose control and smoking cessation; glucose levels consistently >200. She states that she got new shoe inserts/peg assist. She  admits to compliance with offloading. Since my last evaluation there is significant improvement. We will switch to prisma at this time and she will follow up next week. She is noted to be tachycardic at this appointment, heart rate 120s; she has a history of heart rate 70-130 according to our records. She admits to extreme agitation r/t personal issues; she was advised to monitor her heartrate and contact her physician if it does not return to a more normal range (<100). She takes cardizem twice daily. 03/15/18-She is here in follow-up evaluation for left great toe ulcer. She remains noncompliant with glucose control and smoking cessation. She admits to compliance with wearing offloading shoe. The ulcer is improved/stable and we will continue with the same treatment plan and she will follow-up next week 03/22/18-She is here for evaluation for left great toe ulcer. There continues to be significant improvement despite recurrent hyperglycemia (over 500 yesterday) and she continues to smoke. She has been compliant with offloading and we will continue with same treatment plan and she will follow-up next week. 03/29/18-She is here for evaluation for left great toe ulcer. Despite continuing to smoke and uncontrolled diabetes she continues to improve. She is compliant with offloading shoe. We will continue with the same treatment plan and she will follow-up next week 04/05/18- She is here in follow up evaluation for a left great toe ulcer; she presents with small pustule to left fifth toe (resembles ant bite). She admits to compliance with wearing offloading shoe; continues to smoke or have uncontrolled blood glucose control. There is more callus than usual with evidence of bleeding; she denies known trauma. 04/12/18-She is here for evaluation of left great toe ulcer. Despite noncompliance with glycemic control and smoking she continues to make [name], [name] ([id number]) improvement. She continues to wear  offloading shoe. The pustule, that was identified last week, to the left fifth toe is resolved. She will follow-up in 2 weeks 05/03/18-she is seen in follow-up evaluation for a left great toe ulcer. She is compliant with offloading, otherwise noncompliant with glycemic control and smoking. She has plateaued and there is minimal improvement noted. We will transition to [facility], replaced the insert to her surgical shoe and she will follow-up in one week 05/10/18- She is here in follow up evaluation for a left great toe ulcer. It appears stable despite measurement change. We will continue with same treatment plan and follow up next week. 05/24/18-She is seen in follow-up evaluation for a left great toe ulcer. She remains compliant with offloading, has made significant improvement in her diet, decreasing the amount of sugar/soda. She said her recent A1c was 10.9 which is lower than. She did see a diabetic nutritionist/educator yesterday. She continues to smoke. We will continue with the same treatment plan and she'll follow-up next week. 05/31/18- She is seen in follow-up evaluation for left great toe ulcer. She continues to remain compliant with offloading, continues to make improvement in her diet, increasing her water and decreasing the amount of sugar/soda. She does continue to smoke with no desire to quit. We will apply Prisma to the depth and Hydrofera Blue over. We have not received insurance authorization for oasis. She will follow up next week. 06/07/18-She is seen in follow-up evaluation for left great toe ulcer. It has  stalled according to today's measurements although base appears stable. She says she saw a diabetic educator yesterday; her average blood sugars are less than 300 which is an improvement for her. She continues to smoke and states "that's my next step" She continues with water over soda. We will order for xray, culture and reinstate ace wrap compression prior to placing apligraf  for next week. She is voicing no complaints or concerns. Her dressing will change to iodoflex over the next week in preparation for apligraf. 06/14/18-She is seen in follow-up evaluation for left great toe ulcer. Plain film x-ray performed last week was negative for osteomyelitis. Wound culture obtained last week grew strep B and OSSA; she is initiated on keflex and cefdinir today; there is erythema to the toe which could be from ace wrap compression, she has a history of wrapping too tight and has has been encouraged to maintain ace wraps that we place today. We will hold off on application of apligraf today, will apply next week after antibiotic therapy has been initiated. She admits today that she has resumed taking a shower with her foot/toe submerged in water, she has been reminded to keep foot/toe out of the bath water. She will be seen in follow up next week 06/21/18-she is seen in follow-up evaluation for left great toe ulcer. She is tolerating antibiotic therapy with no GI disturbance. The wound is stable. Apligraf was applied today. She has been decreasing her smoking, only had 4 cigarettes yesterday and 1 today. She continues being more compliant in diabetic diet. She will follow-up next week for evaluation of site, if stable will remove at 2 weeks. 06/28/18- She is here in follow up evalution. Apligraf was placed last week, she states the dressing fell off on Tuesday and she was dressing with hydrofera blue. She is healed and will be discharged from the clinic today. She has been instructed to continue with smoking cessation, continue monitoring glucose levels, offloading for an additional 4 weeks and continue with hydrofera blue for additional two weeks for any possible microscopic opening. Readmission: 08/07/18 on evaluation today patient presents for reevaluation concerning the ulcer of her right great toe. She was previously discharged on 06/28/18 healed. Nonetheless she states that this  began to show signs of drainage she subsequently went to her primary care provider. Subsequently an x-ray was performed on 08/01/18 which was negative. The patient was also placed on antibiotics at that time. Fortunately they should have been effective for the infection. Nonetheless she's been experiencing some improvement but still has a lot of drainage coming from the wound itself. 08/14/18 on evaluation today patient's wound actually does show signs of improvement in regard to the erythema at this point. She has completed the antibiotics. With that being said we did discuss the possibility of placing her in a total contact cast as of today although I think that I may want to give this just a little bit more time to ensure nothing recurrence as far as her infection is concerned. I do not want to put in the cast and risk infection at that time if things are not completely resolved. With that being said she is gonna require some debridement today. 08/21/18 on evaluation today patient actually appears to be doing okay in regard to her toe ulcer. She's been tolerating the dressing changes without complication. With that being said it does appear that she is ready and in fact I think it's appropriate for [place] to go ahead and initiate the  total contact cast today. Nonetheless she will require some sharp debridement to prepare the wound for application. Overall I feel like things have been progressing well but we do need to do something to get this to close more readily. 08/24/18 patient seen today for reevaluation after having had the total contact cast applied on Tuesday. She seems to have done very well the wound appears to be doing great and overall I'm pleased with the progress that she's made. There were no abnormal areas of rubbing from the cast on her lower extremity. 08/30/18 on evaluation today patient actually appears to be completely healed in regard to her plantar toe ulcer. She tells me at this  point she's been having a lot of issues with the cast. She almost fell a couple of times the state shall the step of her dog a couple times as well. This is been a very frustrating process for her other nonetheless she has completely healed the wound which is excellent news. Overall there does not appear to be the evidence of infection at this time which is great news. 09/11/18 evaluation today patient presents for follow-up concerning her great toe ulcer on the left which has unfortunately reopened since I last saw her which was only a couple of weeks ago. Unfortunately she was not able to get in to get the shoe and potentially the AFO that's gonna be necessary due to her left foot drop. She continues with offloading shoe but this is not enough to prevent her from reopening it appears. When we last had her in the total contact cast she did well from a healing standpoint but unfortunately the wound reopened as soon as she came out of the cast within just a couple of weeks. 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 This is also part of the reason why she falls on a regular basis. I just do not believe that is gonna be safe for [place] to reinitiate the total contact cast as last time we had this on she fell 3 times one week which is definitely not normal for her. 09/18/18 upon evaluation today the patient actually appears to be doing about the same in regard to her toe ulcer. She did not contact [employer] as I asked her to even though I had given her the prescription. In fact she actually states that she has no idea where the prescription is. She did apparently call [employer] and they told her that all she needed to do was bring the prescription in order to be able to be seen and work on getting the AFO for her  left foot. With all that being said she still does not have an appointment and I'm not sure were things stand that regard. I will give her a new prescription today in order to contact them to get this set up. 09/25/18 on evaluation today patient actually appears to be doing about the same in regard to her toes ulcer. She does have a small areas which seems to have a lot of callous buildup around the edge of the wound which is going to need sharp debridement today. She still is waiting to be scheduled for evaluation with [employer] for possibility of an AFO. She states there supposed to call her tomorrow to  get this set up. Unfortunately it does appear that her foot specifically the toe area is showing signs of erythema. There does not appear to be any systemic infection which is in these good news. [name], [name] ([id number]) 10/02/18 on evaluation today patient actually appears to be doing about the same in regard to her toe ulcer. This really has not done too well although it's not significantly larger it's also not significantly smaller. She has been tolerating the dressing changes without complication. She actually has her appointment with [employer] and [facility] tomorrow to hopefully be measured for obtaining and AFO splint. I think this would be helpful preventing this from reoccurring. We had contemplated starting the cast this week although to be honest I am reluctant to do that as she's been having nausea, vomiting, and seizure activity over the past three days. She has a history of seizures and have been told is nothing that can be done for these. With that being said I do believe that along with the seizures have the nausea vomiting which upon further questioning doesn't seem to be the normal for her and makes me concerned for the possibility of infection or something else going on. I discussed this with the patient and her mother during the office visit today. I do not feel the wound is  effective but maybe something else. The responses this was "this just happens to her at times and we don't know why". They did not seem to be interested in going to the hospital to have this checked out further. 10/09/18 on evaluation today patient presents for follow-up concerning her ongoing toe ulcer. She has been tolerating the dressing changes without complication. Fortunately there does not appear to be any evidence of infection which is great news however I do think that the patient would benefit from going ahead for with the total contact cast. She's actually in a wheelchair today she tells me that she will use her walker if we initiate the cast. I was very specific about the fact that if we were gonna do the cast I wanted to make sure that she was using the walker in order to prevent any falls. She tells me she does not have stairs that she has to traverse on a regular basis at her home. She has not had any seizures since last week again that something that happens to her often she tells me she did talk to [employer] and they said that it may take up to three weeks to get the brace approved for her. Hopefully that will not take that long but nonetheless in the meantime I do think the cast could be of benefit. 10/12/18 on evaluation today patient appears to be doing rather well in regard to her toe ulcer. It's just been a few days and already this is significantly improved both as far as overall appearance and size. Fortunately there's no sign of infection. She is here for her first obligatory cast change. 10/19/18 Seen today for follow up and management of left great toe ulcer. Wound continues to show improvement. Noted small open area with seroussang drainage with palpation. Denies any increased pain or recent fevers during visit. She will continue calcium alginate with offloading shoe. Denies any questions or concerns during visit. 10/26/18 on evaluation today patient appears to be doing about  the same as when I last saw her in regard to her wound bed. Fortunately there does not appear to be any signs of infection. Unfortunately she continues to  have a breakdown in regard to the toe region any time that she is not in the cast. It takes almost no time at all for this to happen. Nonetheless she still has not heard anything from the brace being made by [employer] as to when exactly this will be available to her. Fortunately there is no signs of infection at this time. 10/30/18 on evaluation today patient presents for application of the total contact cast as we just received him this morning. Fortunately we are gonna be able to apply this to her today which is great news. She continues to have no significant pain which is good news. Overall I do feel like things have been improving while she was the cast is when she doesn't have a cast that things get worse. She still has not really heard anything from [facility] regarding her brace. 11/02/18 upon evaluation today patient's wound already appears to be doing significantly better which is good news. Fortunately there does not appear to be any signs of infection also good news. Overall I do think the total contact cast as before is helping to heal this area unfortunately it's just not gonna likely keep the area closed and healed without her getting her brace at least. Again the foot drop is a significant issue for her. 11/09/18 on evaluation today patient appears to be doing excellent in regard to her toe ulcer which in fact is completely healed. Fortunately we finally got the situation squared away with the paperwork which was needed to proceed with getting her brace approved by Medicaid. I have filled that out unfortunately that information has been sent to the orthopedic office that I worked at 2 1/2 years ago and not tired Current wound care measures. Fortunately she seems to be doing very well at this time. 11/23/18 on evaluation today patient appears  to be doing More Poorly Compared to Last Time I Saw Her. At [facility] She Had Completely Healed. Currently she is continuing to have issues with reopening. She states that she just found out that the brace was approved through Medicaid now she just has to go get measured in order to have this fitted for her and then made. Subsequently she does not have an appointment for this yet that is going to complicate things we obviously cannot put her back in the cast if we do not have everything measured because they're not gonna be able to measure her foot while she is in the cast. 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 Obviously this is unfortunate and does have me somewhat worried as well. 11/30/18 on evaluation today patient's toe ulcer actually appears to be doing fairly well. The good news is she will be getting her brace in the shoes next week on Wednesday. Hopefully we will be able to get this to heal without having to go back in the cast however she may need the cast in order to get the wound completely heal and then go from there. Fortunately there's no signs of infection at this time. 12/07/18 on evaluation today patient fortunately did receive her brace and she states she could tell this definitely makes her walk better. With that being said she's been having issues with her toe where she noticed yesterday there was a lot of tissue that was loosing off this appears to be much larger than what it was previous. She  also states that her leg has been read putting much across the top of her foot just about the ankle although this seems to be receiving somewhat. The total area is still red and appears to be someone infected as best I can tell. She is previously taken Bactrim and that may be a good option for her today as well. We are gonna see what I wound culture shows as well and I think that this  is definitely appropriate. With that being said outside of the culture I still need to initiate something in the interim and that's what I'm gonna go ahead and select Bactrim is a good option for her. 12/14/18 on evaluation today patient appears to be doing better in regard to her left great toe ulcer as compared to last week's evaluation. There's still some erythema although this is significantly improved which is excellent news. Overall I do believe that she is making good progress is still gonna take some time before she is where I would like her to be from the standpoint of being able to place her back into the total contact cast. Hopefully we will be where we need to be by next week. 12/21/18 on evaluation today patient actually appears to be doing poorly in regard to her toe ulcer. She's been tolerating the dressing changes without complication. Fortunately there's no signs of systemic infection although she does have a lot of drainage from the toe ulcer and this does seem to be causing some issues at this point. She does have erythema on the distal portion of her toe that appears to be likely cellulitis. 12/28/18 on evaluation today patient actually appears to be doing a little better in my pinion in regard to her toe ulcer. With that being said she still does have some evidence of infection at this time and for her culture she had both E. coli as well as enterococcus as organisms noted on evaluation. For that reason I think that though the Keflex likely has treated the E. coli rather well this has really done nothing for the enterococcus. We are going to have to initiate treatment for this specifically. [name], [name] ([id number]) 01/04/19 on evaluation today patient's toe actually appears to be doing better from the standpoint of infection. She currently would like to see about putting the cash back on I think that this is appropriate as long as she takes care of it and keeps it from getting  wet. She is gonna have some drainage we can definitely pass this up with Drawtex and alginate to try to prevent as much drainage as possible from causing the problems. With that being said I do want to at least try her with the cast between now and Tuesday. If there any issues we can't continue to use it then I will discontinue the use of the cast at that point. 01/08/19 on evaluation today patient actually appears to be doing very well as far as her foot ulcer specifically the great toe on the left is concerned. She did have an area of rubbing on the medial aspect of her left ankle which again is from the cast. Fortunately there's no signs of infection at this point in this appears to be a very slight skin breakdown. The patient tells me she felt it rubbing but didn't think it was that bad. Fortunately there is no signs of active infection at this time which is good news. No fevers, chills, nausea, or vomiting noted at this time. 01/15/19  on evaluation today patient actually appears to be doing well in regard to her toe ulcer. Again as previous she seems to do well and she has the cast on which indicates to me that during the time she doesn't have a cast on she's putting way too much pressure on this region. Obviously I think that's gonna be an issue as with the current national emergency concerning the Covid-19 Virus it has been recommended that we discontinue the use of total contact casting by the chief medical officer of our company, Dr. Simona Huh. The reasoning is that if a patient becomes sick and cannot come into have the cast removed they could not just leave this on for an additional two weeks. Obviously the hospitals also do not want to receive patient's who are sick into the emergency department to potentially contaminate the region and spread the Covid-19 Virus among other sick individuals within the hospital system. Therefore at this point we are suspending the use of total contact cast until  the current emergency subsides. This was all discussed with the patient today as well. 01/22/19 on evaluation today patient's wound on her left great toe appears to be doing slightly worse than previously noted last week. She tells me that she has been on this quite a bit in fact she tells me she's been awake for 38 straight hours. This is due to the fact that she's having to care for grandparents because nobody else will. She has been taking care of them for five the last seven days since I've seen her they both have dementia his is from a stroke and her grandmother's was progressive. Nonetheless she states even her mom who knows her condition and situation has only help two of those days to take care of them she's been taking care of the rest. Fortunately there does not appear to be any signs of active infection in regard to her toe at this point although obviously it doesn't look as good as it did previous. I think this is directly related to her not taking off the pressure and friction by way of taking things easy. Though I completely understand what's going on. 01/29/19 on evaluation today patient's tools are actually appears to be showing some signs of improvement today compared to last week's evaluation as far as not necessarily the overall size of the wound but the fact that she has some new skin growth in between the two ends of the wound opening. Overall I feel like she has done well she states that she had a family member give her what sounds to be a CAM walker boot which has been helpful as well. 02/05/19 on evaluation today patient's wound bed actually appears to be doing significantly better in regard to her overall appearance of the size of the wound. With that being said she is still having an issue with offloading efficiently enough to get this to close. Apparently there is some signs of infection at this point as well unfortunately. Previously she's done well of Augmentin I really do not  see anything that needs to be culture currently but there theme and cellulitis of the foot that I'm seeing I'm gonna go ahead and place her on an antibiotic today to try to help clear this up. 02/12/2019 on evaluation today patient actually appears to be doing poorly in regard to her overall wound status. She tells me she has been using her offloading shoe but actually comes in today wearing her tennis shoe with the  AFO brace. Again as I previously discussed with her this is really not sufficient to allow the area to heal appropriately. Nonetheless she continues to be somewhat noncompliant and I do wonder based on what she has told my nurse in the past as to whether or not a good portion of this noncompliance may be recreational drug and alcohol related. She has had a history of heroin overdose and this was fairly recently in the past couple of months that have been seeing her. Nonetheless overall I feel like her wound looks significantly worse today compared to what it was previous. She still has significant erythema despite the Augmentin I am not sure that this is an appropriate medication for her infection I am also concerned that the infection may have gone down into her bone. 02/19/19 on evaluation today patient actually appears to be doing about the same in regard to her toe ulcer. Unfortunately she continues to show signs of bone exposure and infection at this point. There does not appear to be any evidence of worsening of the infection but I'm also not really sure that it's getting significantly better. She is on the Augmentin which should be sufficient for the Staphylococcus aureus infection that she has at this point. With that being said she may need IV antibiotics to more appropriately treat this. We did have a discussion today about hyperbaric option therapy. 02/28/19 on evaluation today patient actually appears to be doing much worse in regard to the wound on her left great toe as compared  to even my previous evaluation last week. Unfortunately this seems to be training in a pretty poor direction. Her toe was actually now starting to angle laterally and I can actually see the entire joint area of the proximal portion of the digit where is the distal portion of the digit again is no longer even in contact with the joint line. Unfortunately there's a lot more necrotic tissue around the edge and the toe appears to be showing signs of becoming gangrenous in my pinion. I'm very concerned about were things stand at this point. She did see infectious disease and they are planning to send in a prescription for Sivextro for her and apparently this has been approved. With that being said I don't think she should avoid taking this but at the same time I'm not sure that it's gonna be sufficient to save her toe at this point. 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 03/05/19 on evaluation today patient actually appears to be doing quite well in regard to her toe all things considering. She still has bone exposed but there appears to be much less your thing on overall the appearance of the wound and the toe itself is dramatically improved. She still does have some issues currently obviously with infection she did see vascular as well and there concerned that her blood flow to the toad. For that reason they are setting up for an angiogram next week. 03/14/19 on evaluation today patient appears to be doing very poor in regard to her toe and specifically in regard to the ulceration and the fact that she's starting to notice the toe was leaning even more towards the lateral aspect and the complete joint is visible on the proximal aspect of the  joint. Nonetheless she's also noted a significant odor and  the tip of the toe is turning more dark and necrotic appearing. Overall I think she is getting worse not better as far as this is concerned. For that reason I am recommending at this point that she likely needs to be seen for likely amputation. READMISSION 03/19/2021 This is a patient that we cared for in this clinic for a prolonged period of time in 2019 and 2020 with a left foot and left first toe wound. 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 ([id number]) 04/09/20 by Dr. Luana Shu. In December 2021 she had an ulcer on her right great toe as well as the fourth and fifth toes. 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 She also had an angiogram at that time and underwent angioplasty of the right anterior tibial artery. In any case she claims that the wound on the right foot is closed I did not look at this today which was probably an oversight although I think that should be done next week. After her surgery she developed a dehiscence but I do not see any follow-up of this. According to Dr. Deborra Medina last review that she was out of the area being cared for by another physician but recently came back to his attention. 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 A culture of this area showed E. coli apparently before she came back to see Dr. Luana Shu she was supposed to be receiving antibiotics but she did not really take them. Nor is she offloading this area at all. Finally her last hemoglobin A1c listed in epic was in March 2022 at 14.1 she says things are a lot better since then although I am not sure. 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 She had this wound at the time. During this admission she had E. coli sepsis a TEE was negative. Past medical  history is extensive and includes type 2 diabetes with peripheral neuropathy cardiomyopathy with an ejection fraction of 33%, hypertension, hyperlipidemia chronic renal failure stage III history of substance abuse with cocaine although she claims to be clean now verified by her mother. She is still a heavy cigarette smoker. She has a history of bipolar disorder seizure disorder ABI in our clinic was 1.05 6/1; left midfoot in the setting of a TMA done previously. Round circular wound with a "knuckle" of protruding tissue. The problem is that the knuckle was not attached to any of the surrounding granulation and this probed proximally widely I removed a large portion of this tissue. This wound goes with considerable undermining laterally. I do not feel any bone there was no purulence but this is a deep wound. 6/8; in spite of the debridement I did last week. She arrives with a wound looking exactly the same. A protruding "knuckle" of tissue nonadherent to most of the surrounding tissue. There is considerable depth around this from 6-12 o'clock at 2.7 cm and undermining of 1 cm. This does not look overtly infected and the x-ray I did last week was negative for any osseous abnormalities. We have been using silver collagen 6/15; deep tissue culture I did last week showed moderate staph aureus and moderate Pseudomonas. This will definitely require prolonged antibiotic therapy. The pathology on the protuberant area  was negative for malignancy fungus etc. 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 We have been using silver collagen. I am going to be prescribing Levaquin for 2 weeks. Her CT scan of the foot is down for 7/5 6/22; CT scan of the foot on 7 5. She says she has hardware in the left leg from her previous fracture. She is on the Levaquin for the deep tissue culture I did that showed methicillin sensitive staph aureus and Pseudomonas. I gave  her a 2-week supply and she will have another week. She arrives in clinic today with the same protuberant tissue however this is nonadherent to the tissue surrounding it. I am really at a loss to explain this unless there is underlying deep tissue infection 6/29; patient presents for 1 week follow-up. She has been using collagen to the wound bed. She reports taking her antibiotics as prescribed.She has no complaints or issues today. She denies signs of infection. 7/6; patient presents for one week followup. She has been using collagen to the wound bed. She states she is taking Levaquin however at times she is not able to keep it down. She denies signs of infection. 7/13; patient presents for 1 week follow-up. She has been using silver alginate to the wound bed. She still has nausea when taking her antibiotics. She denies signs of infection. 7/20; patient presents for 1 week follow-up. She has been using silver alginate with gentamicin cream to the wound bed. She denies any issues and has no complaints today. She denies signs of infection. 7/27; patient presents for 1 week follow-up. She continues to use silver alginate with gentamicin cream to the wound bed. She reports starting her antibiotics. She has no issues or complaints. Overall she reports stability to the wound. 8/3; patient presents for 1 week follow-up. She has been using silver alginate with gentamicin cream to the wound bed. She reports completing all antibiotics. She has no issues or complaints today. She denies signs of infection. 8/17; patient presents for 2-week follow-up. He is to use silver alginate to the wound bed. She has no issues or complaints today. She denies signs of infection. She reports her pain has improved in her foot since last clinic visit 8/24; patient presents for 1 week follow-up. She continues to use silver alginate to the wound bed. She has no issues or complaints. She denies signs of infection. Pain is  stable. 9/7; patient presents for follow-up. She missed her last week appointment due to feeling ill. She continues to use silver alginate. She has a new wound to the right lower extremity that is covered in eschar. She states It occurred over the past week and has no idea how it started. She currently denies signs of infection. 9/14; patient presents for follow-up. To the left foot wound she has been using gentamicin cream and silver alginate. To the right lower extremity wound she has been keeping this covered and has not obtain Santyl. 9/21; patient presents for follow-up. She reports using gentamicin cream and silver alginate to the left foot and Santyl to the right lower extremity wound. She has no issues or complaints today. She denies signs of infection. 9/28; patient presents for follow-up. She reports a new wound to her right heel. She states this occurred a few days ago and is progressively gotten worse. She has been trying to clean the area with a Q-tip and Santyl. She reports stability in the other 2 wounds. She has  been using gentamicin cream and silver alginate to the left foot and Santyl to the right lower extremity wound. 10/12; patient presents for follow-up. She reports improvement to the wound beds. She is seeing vein and vascular to discuss the potential of a left BKA. She states they are going to do an arteriogram. She continues to use silver alginate with dressing changes to her wounds. 11/2; patient presents for follow-up. She states she has not been doing dressing changes to the wound beds. She states she is not able to offload the areas. She reports chronic pain to her left foot wound. 11/9; patient presents for follow-up. She came in with only socks on. She states she forgot to put on shoes. It is unclear if she is doing any dressing changes. She currently denies systemic signs of infection. 11/16; patient presents for follow-up. She came again only with socks on. She  states she does not wear shoes ever. It is unclear if she does dressing changes. She currently denies systemic signs of infection. 11/23; patient presents for follow-up. She wore her shoes today. It still unclear exactly what dressing she is using for each wound but she did states she obtained Dakin's solution and has been using this to the left foot wound. She currently denies signs of infection. 11/30; patient presents for follow-up. She has no issues or complaints today. She currently denies signs of infection. 12/7; patient presents for follow-up. She has no issues or complaints today. She has been using Hydrofera Blue to the right heel wound and Dakin solution to the left foot wound. Her right anterior leg wound is healed. She currently denies signs of infection. ZYLIAH, SCHIER A. ([id number]) 12/14; patient presents for follow-up. She has been using Hydrofera Blue to the right heel and Dakin's to the left foot wounds. She has no issues or complaints today. She denies signs of infection. 12/21; patient presents for follow-up. She reports using Hydrofera Blue to the right heel and Dakin's to the left foot wound. She denies signs of infection. 12/28; patient presents for follow-up. She continues to use Dakin's to the left foot wound and Hydrofera Blue to the right heel wound. She denies signs of infection. Electronic Signature(s) Signed: 10/20/2021 11:28:43 AM By: Kalman Shan DO Entered By: Kalman Shan on 10/20/2021 11:22:52 [name] ([id number]) -------------------------------------------------------------------------------- Callus Pairing Details Patient Name: [name] A. Date of Service: 10/20/2021 10:30 AM Medical Record Number: [id number] Patient Account Number: [id number] Date of Birth/Sex: [birth year] (45 y.o. F) Treating RN: Levora Dredge Primary Care Provider: Raelene Bott Other Clinician: Referring Provider: Raelene Bott Treating Provider/Extender:  Yaakov Guthrie in Treatment: 30 Procedure Performed for: Wound #7 Left,Midline,Plantar Foot Performed By: Physician Kalman Shan, MD Post Procedure Diagnosis Same as Pre-procedure Notes to left foot wound, pt tolerated well Electronic Signature(s) Signed: 10/20/2021 4:21:58 PM By: Levora Dredge Entered By: Levora Dredge on 10/20/2021 10:56:18 [name] ([id number]) -------------------------------------------------------------------------------- Physical Exam Details Patient Name: [name] A. Date of Service: 10/20/2021 10:30 AM Medical Record Number: [id number] Patient Account Number: [id number] Date of Birth/Sex: [birth year] (45 y.o. F) Treating RN: Levora Dredge Primary Care Provider: Raelene Bott Other Clinician: Referring Provider: Raelene Bott Treating Provider/Extender: Yaakov Guthrie in Treatment: 80 Constitutional . Cardiovascular . Psychiatric . Notes Left foot (Transmetatarsal amputation): Wound to the plantar aspect that has granulation tissue with scant nonviable tissue and increased pockets of depth. There is circumferential callus. Right foot: To the heel there is an open wound with Granulation tissue , Nonviable tissue  and Circumferential callus. No obvious signs of infection to any wound. Electronic Signature(s) Signed: 10/20/2021 11:28:43 AM By: Kalman Shan DO Entered By: Kalman Shan on 10/20/2021 11:23:50 [name] ([id number]) -------------------------------------------------------------------------------- Physician Orders Details Patient Name: [name] A. Date of Service: 10/20/2021 10:30 AM Medical Record Number: [id number] Patient Account Number: [id number] Date of Birth/Sex: Mar 04, 1976 (45 y.o. F) Treating RN: Levora Dredge Primary Care Provider: Raelene Bott Other Clinician: Referring Provider: Raelene Bott Treating Provider/Extender: Yaakov Guthrie in Treatment:  30 Verbal / Phone Orders: No Diagnosis Coding Follow-up Appointments o Return Appointment in 1 week. Bathing/ Shower/ Hygiene o Wash wounds with antibacterial soap and water. Off-Loading Left Lower Extremity o Open toe surgical shoe with peg assist. o Turn and reposition every 2 hours o Other: - peg shoe right, off-loader left Medications-Please add to medication list. o P.O. Antibiotics Wound Treatment Wound #7 - Foot Wound Laterality: Plantar, Left, Midline Cleanser: Dakin 16 (oz) 0.25 1 x Per Day/30 Days Discharge Instructions: Use as directed. Primary Dressing: Gauze 1 x Per Day/30 Days Discharge Instructions: As directed: moistened with Dakins Solution Secured With: Kerlix Roll Sterile or Non-Sterile 6-ply 4.5x4 (yd/yd) 1 x Per Day/30 Days Discharge Instructions: Apply Kerlix as directed Wound #9 - Calcaneus Wound Laterality: Right Cleanser: Soap and Water 1 x Per Day/30 Days Discharge Instructions: Gently cleanse wound with antibacterial soap, rinse and pat dry prior to dressing wounds Primary Dressing: Hydrofera Blue Ready Transfer Foam, 2.5x2.5 (in/in) 1 x Per Day/30 Days Discharge Instructions: Apply Hydrofera Blue Ready to wound bed as directed Secondary Dressing: Bordered Gauze Sterile-HBD 4x4 (in/in) 1 x Per Day/30 Days Discharge Instructions: Cover wound with Bordered Guaze Sterile as directed Secured With: coverlet 1 x Per Day/30 Days Electronic Signature(s) Signed: 10/20/2021 11:28:43 AM By: Kalman Shan DO Entered By: Kalman Shan on 10/20/2021 11:28:22 Boschert, Remonia AMarland Kitchen ([id number]) -------------------------------------------------------------------------------- Problem List Details Patient Name: [name] A. Date of Service: 10/20/2021 10:30 AM Medical Record Number: [id number] Patient Account Number: [id number] Date of Birth/Sex: 04-Jan-1976 (45 y.o. 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 03/19/2021 No Yes severity L97.512 Non-pressure chronic ulcer of other part of right foot with fat layer 09/15/2021 No Yes exposed L97.812 Non-pressure chronic ulcer of other part of right lower leg with fat layer 09/15/2021 No Yes exposed E11.621 Type 2 diabetes mellitus with foot ulcer 03/19/2021 No Yes E11.42 Type 2 diabetes mellitus with diabetic polyneuropathy 03/19/2021 No Yes Inactive Problems ICD-10 Code Description Active Date Inactive Date S81.801A Unspecified open wound, right lower leg, initial encounter 06/30/2021 06/30/2021 Resolved Problems Electronic Signature(s) Signed: 10/20/2021 11:28:43 AM By: Kalman Shan DO Entered By: Kalman Shan on 10/20/2021 11:21:45 Laurie, Edona A. ([id number]) -------------------------------------------------------------------------------- Progress Note Details Patient Name: [name] A. Date of Service: 10/20/2021 10:30 AM Medical Record Number: [id number] Patient Account Number: [id number] Date of Birth/Sex: September 09, 1976 (45 y.o. F) Treating RN: Levora Dredge Primary Care Provider: Raelene Bott Other Clinician: Referring Provider: Raelene Bott Treating Provider/Extender: Yaakov Guthrie in Treatment: 30 Subjective Chief Complaint Information obtained from Patient 03/19/2021; patient referred by Dr. Luana Shu who has been looking after her left foot for quite a period of time for review of a nonhealing area in the left midfoot History of Present Illness (HPI) 01/18/18-She is here for initial evaluation  of the left great toe ulcer. She is a poor historian in regards to timeframe in detail. She states approximately 4 weeks ago she lacerated her toe on  something in the house. She followed up with her primary care who placed her on Bactrim and ultimately a second dose of Bactrim prior to coming to wound clinic. She states she has been treating the toe with peroxide, Betadine and a Band-Aid. She did not check her blood sugar this morning but checked it yesterday morning it was 327; she is unaware of a recent A1c and there are no current records. She saw Dr. she would've orthopedics last week for an old injury to the left ankle, she states he did not see her toe, nor did she bring it to his attention. She smokes approximately 1 pack cigarettes a day. Her social situation is concerning, she arrives this morning with her mother who appears extremely intoxicated/under the influence; her mother was asked to leave the room and be monitored by the patient's grandmother. The patient's aunt then accompanied the patient and the room throughout the rest of the appointment. We had a lengthy discussion regarding the deleterious effects of uncontrolled hyperglycemia and smoking as it relates to wound healing and overall health. She was strongly encouraged to decrease her smoking and get her diabetes under better control. She states she is currently on a diet and has cut down her [facility] consumption. The left toe is erythematous, macerated and slightly edematous with malodor present. The edema in her left foot is below her baseline, there is no erythema streaking. We will treat her with Santyl, doxycycline; we have ordered and xray, culture and provided a Peg assist surgical shoe and cultured the wound. 01/25/18-She is here in follow-up evaluation for a left great toe ulcer and presents with an abscess to her suprapubic area. She states her blood sugars remain elevated, feeling "sick" and if levels are below 250, but she is trying. She has made no attempt to decrease her smoking stating that we "can't take away her food in her cigarettes". She has been compliant  with offloading using the PEG assist you. She is using Santyl daily. the culture obtained last week grew staph aureus and Enterococcus faecalis; continues on the doxycycline and Augmentin was added on Monday. The suprapubic area has erythema, no femoral variation, purple discoloration, minimal induration, was accessed with a cotton tip applicator with sanguinopurulent drainage, this was cultured, I suspect the current antibiotic treatment will cover and we will not add anything to her current treatment plan. She was advised to go to urgent care or ER with any change in redness, induration or fever. 02/01/18-She is here in follow-up evaluation for left great toe ulcers and a new abdominal abscess from last week. She was able to use packing until earlier this week, where she "forgot it was there". She states she was feeling ill with GI symptoms last week and was not taking her antibiotic. She states her glucose levels have been predominantly less than 200, with occasional levels between 200-250. She thinks this was contributing to her GI symptoms as they have resolved without intervention. There continues to be significant laceration to left toe, otherwise it clinically looks stable/improved. There is now less superficial opening to the lateral aspect of the great toe that was residual blister. We will transition to [facility] to all wounds, she will continue her Augmentin. If there is no change or deterioration next week for reculture. 02/08/18-She is here in  follow-up evaluation for left great toe ulcer and abdominal ulcer. There is an improvement in both wounds. She has been wrapping her left toe with coban, not by our direction, which has created an area of discoloration to the medial aspect; she has been advised to NOT use coban secondary to her neuropathy. She states her glucose levels have been high over this last week ranging from 200-350, she continues to smoke. She admits to being less  compliant with her offloading shoe. We will continue with same treatment plan and she will follow-up next week. 02/15/18-She is here in follow-up evaluation for left great toe ulcer and abdominal ulcer. The abdominal ulcer is epithelialized. The left great toe ulcer is improved and all injury from last week using the Coban wrap is resolved, the lateral ulcer is healed. She admits to noncompliance with wearing offloading shoe and admits to glucose levels being greater than 300 most of the week. She continues to smoke and expresses no desire to quit. There is one area medially that probes deeper than it has historically, erythema to the toe and dorsal foot has consistently waxed and waned. There is no overt signs of cellulitis or infection but we will culture the wound for any occult infection given the new area of depth and erythema. We will hold off on sensitivities for initiation of antibiotic therapy. 02/22/18-She is here in follow up evaluation for left great toe ulcer. There is overall significant improvement in both wound appearance, erythema and edema with changes made last week. She was not initiated on antibiotic therapy. Culture obtained last week showed oxacillin sensitive staph aureus, sensitive to clindamycin. Clindamycin has been called into the pharmacy but she has been instructed to hold off on initiation secondary to overall clinical improvement and her history of antibiotic intolerance. She has been instructed to contact the clinic with any noted changes/deterioration and the wound, erythema, edema and/or pain. She will follow-up next week. She continues to smoke and her glucose levels remain elevated >250; she admits to compliance with offloading shoe 03/01/18 on evaluation today patient appears to be doing fairly well in regard to her left first toe ulcer. She has been tolerating the dressing changes with the [facility] Dressing without complication and overall this has definitely  showed signs of improvement according to records as well is what the patient tells me today. I'm very pleased in that regard. She is having no pain today 03/08/18 She is here for follow up evaluation of a left great toe ulcer. She remains non-compliant with glucose control and smoking cessation; glucose levels consistently >200. She states that she got new shoe inserts/peg assist. She admits to compliance with offloading. Since my last evaluation there is significant improvement. We will switch to prisma at this time and she will follow up next week. She is noted to be tachycardic at this appointment, heart rate 120s; she has a history of heart rate 70-130 according to our records. She admits to extreme agitation r/t personal issues; she was advised to monitor her heartrate and contact her physician if it does not return to a more normal range (<100). She takes cardizem twice daily. 03/15/18-She is here in follow-up evaluation for left great toe ulcer. She remains noncompliant with glucose control and smoking cessation. She admits to compliance with wearing offloading shoe. The ulcer is improved/stable and we will continue with the same treatment plan and she will follow-up next week 03/22/18-She is here for evaluation for left great toe ulcer. There continues  to be significant improvement despite recurrent hyperglycemia (over 500 yesterday) and she continues to smoke. She has been compliant with offloading and we will continue with same treatment plan and she will follow-up next week. Kady, Kynzi A. ([id number]) 03/29/18-She is here for evaluation for left great toe ulcer. Despite continuing to smoke and uncontrolled diabetes she continues to improve. She is compliant with offloading shoe. We will continue with the same treatment plan and she will follow-up next week 04/05/18- She is here in follow up evaluation for a left great toe ulcer; she presents with small pustule to left fifth toe (resembles  ant bite). She admits to compliance with wearing offloading shoe; continues to smoke or have uncontrolled blood glucose control. There is more callus than usual with evidence of bleeding; she denies known trauma. 04/12/18-She is here for evaluation of left great toe ulcer. Despite noncompliance with glycemic control and smoking she continues to make improvement. She continues to wear offloading shoe. The pustule, that was identified last week, to the left fifth toe is resolved. She will follow-up in 2 weeks 05/03/18-she is seen in follow-up evaluation for a left great toe ulcer. She is compliant with offloading, otherwise noncompliant with glycemic control and smoking. She has plateaued and there is minimal improvement noted. We will transition to [facility], replaced the insert to her surgical shoe and she will follow-up in one week 05/10/18- She is here in follow up evaluation for a left great toe ulcer. It appears stable despite measurement change. We will continue with same treatment plan and follow up next week. 05/24/18-She is seen in follow-up evaluation for a left great toe ulcer. She remains compliant with offloading, has made significant improvement in her diet, decreasing the amount of sugar/soda. She said her recent A1c was 10.9 which is lower than. She did see a diabetic nutritionist/educator yesterday. She continues to smoke. We will continue with the same treatment plan and she'll follow-up next week. 05/31/18- She is seen in follow-up evaluation for left great toe ulcer. She continues to remain compliant with offloading, continues to make improvement in her diet, increasing her water and decreasing the amount of sugar/soda. She does continue to smoke with no desire to quit. We will apply Prisma to the depth and Hydrofera Blue over. We have not received insurance authorization for oasis. She will follow up next week. 06/07/18-She is seen in follow-up evaluation for left great toe ulcer.  It has stalled according to today's measurements although base appears stable. She says she saw a diabetic educator yesterday; her average blood sugars are less than 300 which is an improvement for her. She continues to smoke and states "that's my next step" She continues with water over soda. We will order for xray, culture and reinstate ace wrap compression prior to placing apligraf for next week. She is voicing no complaints or concerns. Her dressing will change to iodoflex over the next week in preparation for apligraf. 06/14/18-She is seen in follow-up evaluation for left great toe ulcer. Plain film x-ray performed last week was negative for osteomyelitis. Wound culture obtained last week grew strep B and OSSA; she is initiated on keflex and cefdinir today; there is erythema to the toe which could be from ace wrap compression, she has a history of wrapping too tight and has has been encouraged to maintain ace wraps that we place today. We will hold off on application of apligraf today, will apply next week after antibiotic therapy has been initiated. She admits today  that she has resumed taking a shower with her foot/toe submerged in water, she has been reminded to keep foot/toe out of the bath water. She will be seen in follow up next week 06/21/18-she is seen in follow-up evaluation for left great toe ulcer. She is tolerating antibiotic therapy with no GI disturbance. The wound is stable. Apligraf was applied today. She has been decreasing her smoking, only had 4 cigarettes yesterday and 1 today. She continues being more compliant in diabetic diet. She will follow-up next week for evaluation of site, if stable will remove at 2 weeks. 06/28/18- She is here in follow up evalution. Apligraf was placed last week, she states the dressing fell off on Tuesday and she was dressing with hydrofera blue. She is healed and will be discharged from the clinic today. She has been instructed to continue with  smoking cessation, continue monitoring glucose levels, offloading for an additional 4 weeks and continue with hydrofera blue for additional two weeks for any possible microscopic opening. Readmission: 08/07/18 on evaluation today patient presents for reevaluation concerning the ulcer of her right great toe. She was previously discharged on 06/28/18 healed. Nonetheless she states that this began to show signs of drainage she subsequently went to her primary care provider. Subsequently an x-ray was performed on 08/01/18 which was negative. The patient was also placed on antibiotics at that time. Fortunately they should have been effective for the infection. Nonetheless she's been experiencing some improvement but still has a lot of drainage coming from the wound itself. 08/14/18 on evaluation today patient's wound actually does show signs of improvement in regard to the erythema at this point. She has completed the antibiotics. With that being said we did discuss the possibility of placing her in a total contact cast as of today although I think that I may want to give this just a little bit more time to ensure nothing recurrence as far as her infection is concerned. I do not want to put in the cast and risk infection at that time if things are not completely resolved. With that being said she is gonna require some debridement today. 08/21/18 on evaluation today patient actually appears to be doing okay in regard to her toe ulcer. She's been tolerating the dressing changes without complication. With that being said it does appear that she is ready and in fact I think it's appropriate for [place] to go ahead and initiate the total contact cast today. Nonetheless she will require some sharp debridement to prepare the wound for application. Overall I feel like things have been progressing well but we do need to do something to get this to close more readily. 08/24/18 patient seen today for reevaluation after  having had the total contact cast applied on Tuesday. She seems to have done very well the wound appears to be doing great and overall I'm pleased with the progress that she's made. There were no abnormal areas of rubbing from the cast on her lower extremity. 08/30/18 on evaluation today patient actually appears to be completely healed in regard to her plantar toe ulcer. She tells me at this point she's been having a lot of issues with the cast. She almost fell a couple of times the state shall the step of her dog a couple times as well. This is been a very frustrating process for her other nonetheless she has completely healed the wound which is excellent news. Overall there does not appear to be the evidence of infection at  this time which is great news. 09/11/18 evaluation today patient presents for follow-up concerning her great toe ulcer on the left which has unfortunately reopened since I last saw her which was only a couple of weeks ago. Unfortunately she was not able to get in to get the shoe and potentially the AFO that's gonna be necessary due to her left foot drop. She continues with offloading shoe but this is not enough to prevent her from reopening it appears. When we last had her in the total contact cast she did well from a healing standpoint but unfortunately the wound reopened as soon as she came out of the cast within just a couple of weeks. 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 This is also part of the reason why she falls on a regular basis. I just do not believe that is gonna be safe for [place] to reinitiate the total contact cast as last time we had this on she fell 3 times one week which is definitely not normal for her. 09/18/18 upon evaluation today the patient actually appears to  be doing about the same in regard to her toe ulcer. She did not contact [facility] as I asked her to even though I had given her the prescription. In fact she actually states that she has no idea where the prescription is. She did apparently call [employer] and they told her that all she needed to do was bring the prescription in order to be able to be seen and work on getting the AFO for her left foot. With all that being said she still does not have an appointment and I'm not sure were things stand that regard. I will give her a new prescription today in order to contact them to get this set up. [name], [name] ([id number]) 09/25/18 on evaluation today patient actually appears to be doing about the same in regard to her toes ulcer. She does have a small areas which seems to have a lot of callous buildup around the edge of the wound which is going to need sharp debridement today. She still is waiting to be scheduled for evaluation with [facility] for possibility of an AFO. She states there supposed to call her tomorrow to get this set up. Unfortunately it does appear that her foot specifically the toe area is showing signs of erythema. There does not appear to be any systemic infection which is in these good news. 10/02/18 on evaluation today patient actually appears to be doing about the same in regard to her toe ulcer. This really has not done too well although it's not significantly larger it's also not significantly smaller. She has been tolerating the dressing changes without complication. She actually has her appointment with [facility] and [facility] tomorrow to hopefully be measured for obtaining and AFO splint. I think this would be helpful preventing this from reoccurring. We had contemplated starting the cast this week although to be honest I am reluctant to do that as she's been having nausea, vomiting, and seizure activity over the past three days. She has a history of seizures and have been told is  nothing that can be done for these. With that being said I do believe that along with the seizures have the nausea vomiting which upon  further questioning doesn't seem to be the normal for her and makes me concerned for the possibility of infection or something else going on. I discussed this with the patient and her mother during the office visit today. I do not feel the wound is effective but maybe something else. The responses this was "this just happens to her at times and we don't know why". They did not seem to be interested in going to the hospital to have this checked out further. 10/09/18 on evaluation today patient presents for follow-up concerning her ongoing toe ulcer. She has been tolerating the dressing changes without complication. Fortunately there does not appear to be any evidence of infection which is great news however I do think that the patient would benefit from going ahead for with the total contact cast. She's actually in a wheelchair today she tells me that she will use her walker if we initiate the cast. I was very specific about the fact that if we were gonna do the cast I wanted to make sure that she was using the walker in order to prevent any falls. She tells me she does not have stairs that she has to traverse on a regular basis at her home. She has not had any seizures since last week again that something that happens to her often she tells me she did talk to [employer] and they said that it may take up to three weeks to get the brace approved for her. Hopefully that will not take that long but nonetheless in the meantime I do think the cast could be of benefit. 10/12/18 on evaluation today patient appears to be doing rather well in regard to her toe ulcer. It's just been a few days and already this is significantly improved both as far as overall appearance and size. Fortunately there's no sign of infection. She is here for her first obligatory cast change. 10/19/18  Seen today for follow up and management of left great toe ulcer. Wound continues to show improvement. Noted small open area with seroussang drainage with palpation. Denies any increased pain or recent fevers during visit. She will continue calcium alginate with offloading shoe. Denies any questions or concerns during visit. 10/26/18 on evaluation today patient appears to be doing about the same as when I last saw her in regard to her wound bed. Fortunately there does not appear to be any signs of infection. Unfortunately she continues to have a breakdown in regard to the toe region any time that she is not in the cast. It takes almost no time at all for this to happen. Nonetheless she still has not heard anything from the brace being made by [employer] as to when exactly this will be available to her. Fortunately there is no signs of infection at this time. 10/30/18 on evaluation today patient presents for application of the total contact cast as we just received him this morning. Fortunately we are gonna be able to apply this to her today which is great news. She continues to have no significant pain which is good news. Overall I do feel like things have been improving while she was the cast is when she doesn't have a cast that things get worse. She still has not really heard anything from [facility] regarding her brace. 11/02/18 upon evaluation today patient's wound already appears to be doing significantly better which is good news. Fortunately there does not appear to be any signs of infection also good news. Overall I do think  the total contact cast as before is helping to heal this area unfortunately it's just not gonna likely keep the area closed and healed without her getting her brace at least. Again the foot drop is a significant issue for her. 11/09/18 on evaluation today patient appears to be doing excellent in regard to her toe ulcer which in fact is completely healed. Fortunately we finally got the  situation squared away with the paperwork which was needed to proceed with getting her brace approved by Medicaid. I have filled that out unfortunately that information has been sent to the orthopedic office that I worked at 2 1/2 years ago and not tired Current wound care measures. Fortunately she seems to be doing very well at this time. 11/23/18 on evaluation today patient appears to be doing More Poorly Compared to Last Time I Saw Her. At [facility] She Had Completely Healed. Currently she is continuing to have issues with reopening. She states that she just found out that the brace was approved through Medicaid now she just has to go get measured in order to have this fitted for her and then made. Subsequently she does not have an appointment for this yet that is going to complicate things we obviously cannot put her back in the cast if we do not have everything measured because they're not gonna be able to measure her foot while she is in the cast. 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 Obviously this is unfortunate and does have me somewhat worried as well. 11/30/18 on evaluation today patient's toe ulcer actually appears to be doing fairly well. The good news is she will be getting her brace in the shoes next week on Wednesday. Hopefully we will be able to get this to heal without having to go back in the cast however she may need the cast in order to get the wound completely heal and then go from there. Fortunately there's no signs of infection at this time. 12/07/18 on evaluation today patient fortunately did receive her brace and she states she could tell this definitely makes her walk better. With that being said she's been having issues with her toe where she noticed yesterday there was a lot of tissue that was loosing off this appears to be much larger than what it was previous. She also  states that her leg has been read putting much across the top of her foot just about the ankle although this seems to be receiving somewhat. The total area is still red and appears to be someone infected as best I can tell. She is previously taken Bactrim and that may be a good option for her today as well. We are gonna see what I wound culture shows as well and I think that this is definitely appropriate. With that being said outside of the culture I still need to initiate something in the interim and that's what I'm gonna go ahead and select Bactrim is a good option for her. 12/14/18 on evaluation today patient appears to be doing better in regard to her left great toe ulcer as compared to last week's evaluation. There's still some erythema although this is significantly improved which is excellent news. Overall I do believe that she is making good progress is still gonna take some time before she is where I would like her to be from the standpoint of being  able to place her back into the total contact cast. Hopefully we will be where we need to be by next week. 12/21/18 on evaluation today patient actually appears to be doing poorly in regard to her toe ulcer. She's been tolerating the dressing changes without complication. Fortunately there's no signs of systemic infection although she does have a lot of drainage from the toe ulcer and this does [name], [name] ([id number]) seem to be causing some issues at this point. She does have erythema on the distal portion of her toe that appears to be likely cellulitis. 12/28/18 on evaluation today patient actually appears to be doing a little better in my pinion in regard to her toe ulcer. With that being said she still does have some evidence of infection at this time and for her culture she had both E. coli as well as enterococcus as organisms noted on evaluation. For that reason I think that though the Keflex likely has treated the E. coli rather well  this has really done nothing for the enterococcus. We are going to have to initiate treatment for this specifically. 01/04/19 on evaluation today patient's toe actually appears to be doing better from the standpoint of infection. She currently would like to see about putting the cash back on I think that this is appropriate as long as she takes care of it and keeps it from getting wet. She is gonna have some drainage we can definitely pass this up with Drawtex and alginate to try to prevent as much drainage as possible from causing the problems. With that being said I do want to at least try her with the cast between now and Tuesday. If there any issues we can't continue to use it then I will discontinue the use of the cast at that point. 01/08/19 on evaluation today patient actually appears to be doing very well as far as her foot ulcer specifically the great toe on the left is concerned. She did have an area of rubbing on the medial aspect of her left ankle which again is from the cast. Fortunately there's no signs of infection at this point in this appears to be a very slight skin breakdown. The patient tells me she felt it rubbing but didn't think it was that bad. Fortunately there is no signs of active infection at this time which is good news. No fevers, chills, nausea, or vomiting noted at this time. 01/15/19 on evaluation today patient actually appears to be doing well in regard to her toe ulcer. Again as previous she seems to do well and she has the cast on which indicates to me that during the time she doesn't have a cast on she's putting way too much pressure on this region. Obviously I think that's gonna be an issue as with the current national emergency concerning the Covid-19 Virus it has been recommended that we discontinue the use of total contact casting by the chief medical officer of our company, Dr. Simona Huh. The reasoning is that if a patient becomes sick and cannot come into have the  cast removed they could not just leave this on for an additional two weeks. Obviously the hospitals also do not want to receive patient's who are sick into the emergency department to potentially contaminate the region and spread the Covid-19 Virus among other sick individuals within the hospital system. Therefore at this point we are suspending the use of total contact cast until the current emergency subsides. This was all discussed with  the patient today as well. 01/22/19 on evaluation today patient's wound on her left great toe appears to be doing slightly worse than previously noted last week. She tells me that she has been on this quite a bit in fact she tells me she's been awake for 38 straight hours. This is due to the fact that she's having to care for grandparents because nobody else will. She has been taking care of them for five the last seven days since I've seen her they both have dementia his is from a stroke and her grandmother's was progressive. Nonetheless she states even her mom who knows her condition and situation has only help two of those days to take care of them she's been taking care of the rest. Fortunately there does not appear to be any signs of active infection in regard to her toe at this point although obviously it doesn't look as good as it did previous. I think this is directly related to her not taking off the pressure and friction by way of taking things easy. Though I completely understand what's going on. 01/29/19 on evaluation today patient's tools are actually appears to be showing some signs of improvement today compared to last week's evaluation as far as not necessarily the overall size of the wound but the fact that she has some new skin growth in between the two ends of the wound opening. Overall I feel like she has done well she states that she had a family member give her what sounds to be a CAM walker boot which has been helpful as well. 02/05/19 on  evaluation today patient's wound bed actually appears to be doing significantly better in regard to her overall appearance of the size of the wound. With that being said she is still having an issue with offloading efficiently enough to get this to close. Apparently there is some signs of infection at this point as well unfortunately. Previously she's done well of Augmentin I really do not see anything that needs to be culture currently but there theme and cellulitis of the foot that I'm seeing I'm gonna go ahead and place her on an antibiotic today to try to help clear this up. 02/12/2019 on evaluation today patient actually appears to be doing poorly in regard to her overall wound status. She tells me she has been using her offloading shoe but actually comes in today wearing her tennis shoe with the AFO brace. Again as I previously discussed with her this is really not sufficient to allow the area to heal appropriately. Nonetheless she continues to be somewhat noncompliant and I do wonder based on what she has told my nurse in the past as to whether or not a good portion of this noncompliance may be recreational drug and alcohol related. She has had a history of heroin overdose and this was fairly recently in the past couple of months that have been seeing her. Nonetheless overall I feel like her wound looks significantly worse today compared to what it was previous. She still has significant erythema despite the Augmentin I am not sure that this is an appropriate medication for her infection I am also concerned that the infection may have gone down into her bone. 02/19/19 on evaluation today patient actually appears to be doing about the same in regard to her toe ulcer. Unfortunately she continues to show signs of bone exposure and infection at this point. There does not appear to be any evidence of worsening of the  infection but I'm also not really sure that it's getting significantly better. She is  on the Augmentin which should be sufficient for the Staphylococcus aureus infection that she has at this point. With that being said she may need IV antibiotics to more appropriately treat this. We did have a discussion today about hyperbaric option therapy. 02/28/19 on evaluation today patient actually appears to be doing much worse in regard to the wound on her left great toe as compared to even my previous evaluation last week. Unfortunately this seems to be training in a pretty poor direction. Her toe was actually now starting to angle laterally and I can actually see the entire joint area of the proximal portion of the digit where is the distal portion of the digit again is no longer even in contact with the joint line. Unfortunately there's a lot more necrotic tissue around the edge and the toe appears to be showing signs of becoming gangrenous in my pinion. I'm very concerned about were things stand at this point. She did see infectious disease and they are planning to send in a prescription for Sivextro for her and apparently this has been approved. With that being said I don't think she should avoid taking this but at the same time I'm not sure that it's gonna be sufficient to save her toe at this point. 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 03/05/19 on evaluation today patient actually appears to be doing quite well in regard to her toe all things considering. She still has bone exposed but there appears to be much less your thing on overall the appearance of the wound and the toe itself is dramatically improved. She still does have some issues currently obviously with infection she did see vascular as well and there concerned that her blood flow to the toad. For that reason they are  setting up for an angiogram next week. 03/14/19 on evaluation today patient appears to be doing very poor in regard to her toe and specifically in regard to the ulceration and the fact that she's starting to notice the toe was leaning even more towards the lateral aspect and the complete joint is visible on the proximal aspect of the joint. Nonetheless she's also noted a significant odor and the tip of the toe is turning more dark and necrotic appearing. Overall I think she is getting worse not better as far as this is concerned. For that reason I am recommending at this point that she likely needs to be seen for likely amputation. [name], [name] ([id number]) READMISSION 03/19/2021 This is a patient that we cared for in this clinic for a prolonged period of time in 2019 and 2020 with a left foot and left first toe wound. 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 Luana Shu. In December 2021 she had an ulcer on her right great toe as well as the fourth and fifth toes. 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 She also had an angiogram at that time and underwent angioplasty of the right anterior tibial artery. In any case she claims that the wound on the right  foot is closed I did not look at this today which was probably an oversight although I think that should be done next week. After her surgery she developed a dehiscence but I do not see any follow-up of this. According to Dr. Deborra Medina last review that she was out of the area being cared for by another physician but recently came back to his attention. 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 A culture of this area showed E. coli apparently before she came back to see Dr. Luana Shu she was supposed to be receiving antibiotics but she did not really take them. Nor is she offloading this  area at all. Finally her last hemoglobin A1c listed in epic was in March 2022 at 14.1 she says things are a lot better since then although I am not sure. 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 She had this wound at the time. During this admission she had E. coli sepsis a TEE was negative. 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 She is still a heavy cigarette smoker. She has a history of bipolar disorder seizure disorder ABI in our clinic was 1.05 6/1; left midfoot in the setting of a TMA done previously. Round circular wound with a "knuckle" of protruding tissue. The problem is that the knuckle was not attached to any of the surrounding granulation and this probed proximally widely I removed a large portion of this tissue. This wound goes with considerable undermining laterally. I do not feel any bone there was no purulence but this is a deep wound. 6/8; in spite of the debridement I did last week. She arrives with a wound looking exactly the same. A protruding "knuckle" of tissue nonadherent to most of the surrounding tissue. There is considerable depth around this from 6-12 o'clock at 2.7 cm and undermining of 1 cm. This does not look overtly infected and the x-ray I did last week was negative for any osseous abnormalities. We have been using silver collagen 6/15; deep tissue culture I did last week showed moderate staph aureus and moderate Pseudomonas. This will definitely require prolonged antibiotic therapy. The pathology on the protuberant area was negative for malignancy fungus etc. the comment was chronic ulceration with exuberant fibrin necrotic  debris and negative for malignancy. We have been using silver collagen. I am going to be prescribing Levaquin for 2 weeks. Her CT scan of the foot is down for 7/5 6/22; CT scan of the foot on 7 5. She says she has hardware in the left leg from her previous fracture. She is on the Levaquin for the deep tissue culture I did that showed methicillin sensitive staph aureus and Pseudomonas. I gave her a 2-week supply and she will have another week. She arrives in clinic today with the same protuberant tissue however this is nonadherent to the tissue surrounding it. I am really at a loss to explain this unless there is underlying deep tissue infection 6/29; patient presents for 1 week follow-up. She has been using collagen to the wound bed. She reports taking her antibiotics as prescribed.She has no complaints or issues today. She denies signs of infection. 7/6; patient presents for one week followup. She has been using collagen  to the wound bed. She states she is taking Levaquin however at times she is not able to keep it down. She denies signs of infection. 7/13; patient presents for 1 week follow-up. She has been using silver alginate to the wound bed. She still has nausea when taking her antibiotics. She denies signs of infection. 7/20; patient presents for 1 week follow-up. She has been using silver alginate with gentamicin cream to the wound bed. She denies any issues and has no complaints today. She denies signs of infection. 7/27; patient presents for 1 week follow-up. She continues to use silver alginate with gentamicin cream to the wound bed. She reports starting her antibiotics. She has no issues or complaints. Overall she reports stability to the wound. 8/3; patient presents for 1 week follow-up. She has been using silver alginate with gentamicin cream to the wound bed. She reports completing all antibiotics. She has no issues or complaints today. She denies signs of infection. 8/17; patient  presents for 2-week follow-up. He is to use silver alginate to the wound bed. She has no issues or complaints today. She denies signs of infection. She reports her pain has improved in her foot since last clinic visit 8/24; patient presents for 1 week follow-up. She continues to use silver alginate to the wound bed. She has no issues or complaints. She denies signs of infection. Pain is stable. 9/7; patient presents for follow-up. She missed her last week appointment due to feeling ill. She continues to use silver alginate. She has a new wound to the right lower extremity that is covered in eschar. She states It occurred over the past week and has no idea how it started. She currently denies signs of infection. 9/14; patient presents for follow-up. To the left foot wound she has been using gentamicin cream and silver alginate. To the right lower extremity wound she has been keeping this covered and has not obtain Santyl. 9/21; patient presents for follow-up. She reports using gentamicin cream and silver alginate to the left foot and Santyl to the right lower extremity wound. She has no issues or complaints today. She denies signs of infection. 9/28; patient presents for follow-up. She reports a new wound to her right heel. She states this occurred a few days ago and is progressively gotten worse. She has been trying to clean the area with a Q-tip and Santyl. She reports stability in the other 2 wounds. She has been using gentamicin cream and silver alginate to the left foot and Santyl to the right lower extremity wound. 10/12; patient presents for follow-up. She reports improvement to the wound beds. She is seeing vein and vascular to discuss the potential of a left BKA. She states they are going to do an arteriogram. She continues to use silver alginate with dressing changes to her wounds. 11/2; patient presents for follow-up. She states she has not been doing dressing changes to the wound beds. She  states she is not able to offload the areas. She reports chronic pain to her left foot wound. 11/9; patient presents for follow-up. She came in with only socks on. She states she forgot to put on shoes. It is unclear if she is doing any dressing changes. She currently denies systemic signs of infection. 11/16; patient presents for follow-up. She came again only with socks on. She states she does not wear shoes ever. It is unclear if she does dressing changes. She currently denies systemic signs of infection. 11/23; patient presents for follow-up. She  wore her shoes today. It still unclear exactly what dressing she is using for each wound but she did states she obtained Dakin's solution and has been using this to the left foot wound. She currently denies signs of infection. [name], [name] ([id number]) 11/30; patient presents for follow-up. She has no issues or complaints today. She currently denies signs of infection. 12/7; patient presents for follow-up. She has no issues or complaints today. She has been using Hydrofera Blue to the right heel wound and Dakin solution to the left foot wound. Her right anterior leg wound is healed. She currently denies signs of infection. 12/14; patient presents for follow-up. She has been using Hydrofera Blue to the right heel and Dakin's to the left foot wounds. She has no issues or complaints today. She denies signs of infection. 12/21; patient presents for follow-up. She reports using Hydrofera Blue to the right heel and Dakin's to the left foot wound. She denies signs of infection. 12/28; patient presents for follow-up. She continues to use Dakin's to the left foot wound and Hydrofera Blue to the right heel wound. She denies signs of infection. Objective Constitutional Vitals Time Taken: 10:28 AM, Height: 69 in, Weight: 185 lbs, BMI: 27.3, Temperature: 98.2 F, Pulse: 77 bpm, Respiratory Rate: 18 breaths/min, Blood Pressure: 106/78 mmHg. General Notes:  Left foot (Transmetatarsal amputation): Wound to the plantar aspect that has granulation tissue with scant nonviable tissue and increased pockets of depth. There is circumferential callus. Right foot: To the heel there is an open wound with Granulation tissue , Nonviable tissue and Circumferential callus. No obvious signs of infection to any wound. Integumentary (Hair, Skin) Wound #7 status is Open. Original cause of wound was Gradually Appeared. The date acquired was: 08/07/2020. The wound has been in treatment 30 weeks. The wound is located on the [place]. The wound measures 5.5cm length x 2cm width x 0.4cm depth; 8.639cm^2 area and 3.456cm^3 volume. There is Fat Layer (Subcutaneous Tissue) exposed. There is no tunneling or undermining noted. There is a large amount of serosanguineous drainage noted. The wound margin is thickened. There is large (67-100%) pink, hyper - granulation within the wound bed. There is a small (1-33%) amount of necrotic tissue within the wound bed including Adherent Slough. Wound #9 status is Open. Original cause of wound was Gradually Appeared. The date acquired was: 07/18/2021. The wound has been in treatment 13 weeks. The wound is located on the Right Calcaneus. The wound measures 0.4cm length x 0.4cm width x 0.2cm depth; 0.126cm^2 area and 0.025cm^3 volume. There is Fat Layer (Subcutaneous Tissue) exposed. There is no tunneling noted, however, there is undermining starting at 4:00 and ending at 8:00 with a maximum distance of 0.2cm. There is a medium amount of serosanguineous drainage noted. The wound margin is well defined and not attached to the wound base. There is small (1-33%) pale granulation within the wound bed. There is a large (67-100%) amount of necrotic tissue within the wound bed including Eschar and Adherent Slough. 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 No signs of infection on exam. I debrided callus to the left foot wound. I recommended continued aggressive offloading and Dakin's solution  to the left foot wound and Hydrofera Blue to the right heel wound. I am not sure if she is able to offload these areas very well. Procedures Wound #7 Welty, Idell A. ([id number]) Pre-procedure diagnosis of Wound #7 is a Diabetic Wound/Ulcer of the Lower Extremity located on the [place] . An Callus Pairing procedure was performed by Kalman Shan, MD. Post procedure Diagnosis Wound #7: Same as Pre-Procedure Notes: to left foot wound, pt tolerated well Plan Follow-up Appointments: Return Appointment in 1 week. Bathing/ Shower/ Hygiene: Wash wounds with antibacterial soap and water. Off-Loading: Open toe surgical shoe with peg assist. Turn and reposition every 2 hours Other: - peg shoe right, off-loader left Medications-Please add to medication list.: P.O. Antibiotics WOUND #7: - Foot Wound Laterality: Plantar, Left, Midline Cleanser: Dakin 16 (oz) 0.25 1 x Per Day/30 Days Discharge Instructions: Use as directed. Primary Dressing: Gauze 1 x Per Day/30 Days Discharge Instructions: As directed: moistened with Dakins Solution Secured With: Kerlix Roll Sterile or Non-Sterile 6-ply 4.5x4 (yd/yd) 1 x Per Day/30 Days Discharge Instructions: Apply Kerlix as directed WOUND #9: - Calcaneus Wound Laterality: Right Cleanser: Soap and Water 1 x Per Day/30 Days Discharge Instructions: Gently cleanse wound with antibacterial soap, rinse and pat dry prior to dressing wounds Primary Dressing: Hydrofera Blue Ready Transfer Foam, 2.5x2.5 (in/in) 1 x Per  Day/30 Days Discharge Instructions: Apply Hydrofera Blue Ready to wound bed as directed Secondary Dressing: Bordered Gauze Sterile-HBD 4x4 (in/in) 1 x Per Day/30 Days Discharge Instructions: Cover wound with Bordered Guaze Sterile as directed Secured With: coverlet 1 x Per Day/30 Days 1. In office sharp debridement 2. Aggressive offloading 3. Dakin's wet-to-dry to the left foot wound and Hydrofera Blue to the right foot wound Electronic Signature(s) Signed: 10/20/2021 11:28:43 AM By: Kalman Shan DO Entered By: Kalman Shan on 10/20/2021 11:26:27 [name] ([id number]) -------------------------------------------------------------------------------- SuperBill Details Patient Name: [name] A. Date of Service: 10/20/2021 Medical Record Number: [id number] Patient Account Number: [id number] Date of Birth/Sex: 08-18-1976 (45 y.o. 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 ulcer of other part of right lower leg with fat layer exposed E11.621 Type 2 diabetes mellitus with foot ulcer E11.42 Type 2 diabetes mellitus with diabetic polyneuropathy Facility Procedures CPT4 Code: 63149702 Description: 63785 - PARE BENIGN LES; SGL Modifier: Quantity: 1 CPT4 Code: Description: ICD-10 Diagnosis Description L97.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 of other part of left foot with other specifi Modifier: ed severity Quantity: Physician Procedures CPT4 Code: 8850277 Description: 41287 - WC PHYS PARE BENIGN LES; SGL Modifier: Quantity: 1 CPT4 Code: Description: ICD-10 Diagnosis  Description 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 Modifier: severity Quantity: Electronic Signature(s) Signed: 10/20/2021 11:28:43 AM By: Kalman Shan DO Entered By: Kalman Shan on 10/20/2021 11:26:41

## 2021-10-20 NOTE — Progress Notes (Signed)
[name], [name] ([id number]) Visit Report for 10/20/2021 Arrival Information Details Patient Name: [name], [name] Date of Service: 10/20/2021 10:30 AM Medical Record Number: [id number] Patient Account Number: [id number] Date of Birth/Sex: 1976-08-27 (45 y.o. F) Treating RN: Levora Dredge Primary Care Leroy Trim: Raelene Bott Other Clinician: Referring Emaya Preston: Raelene Bott Treating Tracer Gutridge/Extender: Yaakov Guthrie in Treatment: 70 Visit Information History Since Last Visit Added or deleted any medications: No Patient Arrived: Wheel Chair Any new allergies or adverse reactions: No Arrival Time: 10:23 Had a fall or experienced change in Yes Accompanied By: dad activities of daily living that may affect Transfer Assistance: EasyPivot Patient Lift risk of falls: Patient Identification Verified: Yes Hospitalized since last visit: No Secondary Verification Process Completed: Yes Has Dressing in Place as Prescribed: Yes Patient Requires Transmission-Based No Has Footwear/Offloading in Place as Prescribed: Yes Precautions: Left: Surgical Shoe with Pressure Relief Patient Has Alerts: Yes Insole Patient Alerts: Patient on Blood Pain Present Now: Yes Thinner 13m apirin Diabetic Type II Electronic Signature(s) Signed: 10/20/2021 4:21:58 PM By: GLevora DredgeEntered By: GLevora Dredgeon 10/20/2021 10:28:15 SLonzo Candy([id number] -------------------------------------------------------------------------------- Clinic Level of Care Assessment Details Patient Name: [name] Date of Service: 10/20/2021 10:30 AM Medical Record Number: [id number] Account Number: [id number] of Birth/Sex: 9August 08, 1977(45y.o. F) Treating RN: GLevora DredgePrimary Care Katherine Syme: HRaelene BottOther Clinician: Referring Kjersten Ormiston: HRaelene BottTreating Rumaysa Sabatino/Extender: HYaakov Guthriein Treatment: 30 Clinic Level of Care Assessment Items TOOL 1  Quantity Score []  - Use when EandM and Procedure is performed on INITIAL visit 0 ASSESSMENTS - Nursing Assessment / Reassessment []  - General Physical Exam (combine w/ comprehensive assessment (listed just below) when performed on new 0 pt. evals) []  - 0 Comprehensive Assessment (HX, ROS, Risk Assessments, Wounds Hx, etc.) ASSESSMENTS - Wound and Skin Assessment / Reassessment []  - Dermatologic / Skin Assessment (not related to wound area) 0 ASSESSMENTS - Ostomy and/or Continence Assessment and Care []  - Incontinence Assessment and Management 0 []  - 0 Ostomy Care Assessment and Management (repouching, etc.) PROCESS - Coordination of Care []  - Simple Patient / Family Education for ongoing care 0 []  - 0 Complex (extensive) Patient / Family Education for ongoing care []  - 0 Staff obtains CProgrammer, systems Records, Test Results / Process Orders []  - 0 Staff telephones HHA, Nursing Homes / Clarify orders / etc []  - 0 Routine Transfer to another Facility (non-emergent condition) []  - 0 Routine Hospital Admission (non-emergent condition) []  - 0 New Admissions / IBiomedical engineer/ Ordering NPWT, Apligraf, etc. []  - 0 Emergency Hospital Admission (emergent condition) PROCESS - Special Needs []  - Pediatric / Minor Patient Management 0 []  - 0 Isolation Patient Management []  - 0 Hearing / Language / Visual special needs []  - 0 Assessment of Community assistance (transportation, D/C planning, etc.) []  - 0 Additional assistance / Altered mentation []  - 0 Support Surface(s) Assessment (bed, cushion, seat, etc.) INTERVENTIONS - Miscellaneous []  - External ear exam 0 []  - 0 Patient Transfer (multiple staff / HCivil Service fast streamer/ Similar devices) []  - 0 Simple Staple / Suture removal (25 or less) []  - 0 Complex Staple / Suture removal (26 or more) []  - 0 Hypo/Hyperglycemic Management (do not check if billed separately) []  - 0 Ankle / Brachial Index (ABI) - do not check if billed  separately Has the patient been seen at the hospital within the last three years: Yes Total Score: 0 Level Of Care: ____ SLonzo Candy([id number] Electronic Signature(s)  Signed: 10/20/2021 4:21:58 PM By: Levora Dredge Entered By: Levora Dredge on 10/20/2021 11:10:33 [name] ([id number]) -------------------------------------------------------------------------------- Encounter Discharge Information Details Patient Name: Heather Dear A. Date of Service: 10/20/2021 10:30 AM Medical Record Number: [id number] Patient Account Number: [id number] Date of Birth/Sex: 08-24-1976 (45 y.o. F) Treating RN: Levora Dredge Primary Care Yuritzy Zehring: Raelene Bott Other Clinician: Referring Laren Whaling: Raelene Bott Treating Olanda Downie/Extender: Yaakov Guthrie in Treatment: 30 Encounter Discharge Information Items Discharge Condition: Stable Ambulatory Status: Wheelchair Discharge Destination: Home Transportation: Private Auto Accompanied By: step dad Schedule Follow-up Appointment: Yes Clinical Summary of Care: Electronic Signature(s) Signed: 10/20/2021 4:21:58 PM By: Levora Dredge Entered By: Levora Dredge on 10/20/2021 11:13:50 [name] ([id number]) -------------------------------------------------------------------------------- Lower Extremity Assessment Details Patient Name: Heather Dear A. Date of Service: 10/20/2021 10:30 AM Medical Record Number: [id number] Patient Account Number: [id number] Date of Birth/Sex: 1976/05/06 (45 y.o. F) Treating RN: Levora Dredge Primary Care Telesha Deguzman: Raelene Bott Other Clinician: Referring Merrell Borsuk: Raelene Bott Treating Krishawn Vanderweele/Extender: Yaakov Guthrie in Treatment: 30 Edema Assessment Assessed: [Left: No] [Right: No] Edema: [Left: Ye] [Right: s] Vascular Assessment Pulses: Dorsalis Pedis Palpable: [Left:Yes] [Right:Yes] Electronic Signature(s) Signed: 10/20/2021 4:21:58 PM By: Levora Dredge Entered By: Levora Dredge on 10/20/2021 10:45:31 Stopka, Deniqua A. ([id number]) -------------------------------------------------------------------------------- Multi Wound Chart Details Patient Name: Heather Dear A. Date of Service: 10/20/2021 10:30 AM Medical Record Number: [id number] Patient Account Number: [id number] Date of Birth/Sex: 1976/03/04 (45 y.o. F) Treating RN: Levora Dredge Primary Care Quaneisha Hanisch: Raelene Bott Other Clinician: Referring Keyundra Fant: Raelene Bott Treating Anyela Napierkowski/Extender: Yaakov Guthrie in Treatment: 30 Vital Signs Height(in): 71 Pulse(bpm): 76 Weight(lbs): 185 Blood Pressure(mmHg): 106/78 Body Mass Index(BMI): 27 Temperature(F): 98.2 Respiratory Rate(breaths/min): 18 Photos: [N/A:N/A] Wound Location: Left, Midline, Plantar Foot Right Calcaneus N/A Wounding Event: Gradually Appeared Gradually Appeared N/A Primary Etiology: Diabetic Wound/Ulcer of the Lower Diabetic Wound/Ulcer of the Lower N/A Extremity Extremity Comorbid History: Chronic sinus problems/congestion, Chronic sinus problems/congestion, N/A Middle ear problems, Anemia, Middle ear problems, Anemia, Chronic Obstructive Pulmonary Chronic Obstructive Pulmonary Disease (COPD), Congestive Heart Disease (COPD), Congestive Heart Failure, Type II Diabetes, End Stage Failure, Type II Diabetes, End Stage Renal Disease, History of pressure Renal Disease, History of pressure wounds, Neuropathy wounds, Neuropathy Date Acquired: 08/07/2020 07/18/2021 N/A Weeks of Treatment: 30 13 N/A Wound Status: Open Open N/A Measurements L x W x D (cm) 5.5x2x0.4 0.4x0.4x0.2 N/A Area (cm) : 8.639 0.126 N/A Volume (cm) : 3.456 0.025 N/A % Reduction in Area: -223.60% 61.80% N/A % Reduction in Volume: -158.90% 89.20% N/A Starting Position 1 (o'clock): 4 Ending Position 1 (o'clock): 8 Maximum Distance 1 (cm): 0.2 Undermining: No Yes N/A Classification: Grade 2 Grade 1 N/A Exudate  Amount: Large Medium N/A Exudate Type: Serosanguineous Serosanguineous N/A Exudate Color: red, brown red, brown N/A Wound Margin: Thickened Well defined, not attached N/A Granulation Amount: Large (67-100%) Small (1-33%) N/A Granulation Quality: Pink, Hyper-granulation Pale N/A Necrotic Amount: Small (1-33%) Large (67-100%) N/A Necrotic Tissue: Adherent [place] N/A Exposed Structures: Fat Layer (Subcutaneous Tissue): Fat Layer (Subcutaneous Tissue): N/A Yes Yes Fascia: No Fascia: No Tendon: No Tendon: No Muscle: No Muscle: No Joint: No Joint: No Bone: No Bone: No Epithelialization: None None N/A Treatment Notes [name], [name] ([id number]) Electronic Signature(s) Signed: 10/20/2021 4:21:58 PM By: Levora Dredge Entered By: Levora Dredge on 10/20/2021 10:53:35 [name] ([id number]) -------------------------------------------------------------------------------- Clare Details Patient Name: Heather Dear A. Date of Service: 10/20/2021 10:30 AM Medical Record Number: [id number] Patient Account Number: [id number] Date of Birth/Sex:  Aug 19, 1976 (45 y.o. F) Treating RN: Levora Dredge Primary Care Mariesha Venturella: Raelene Bott Other Clinician: Referring Gamaliel Charney: Raelene Bott Treating Kirkland Figg/Extender: Yaakov Guthrie in Treatment: 30 Active Inactive Necrotic Tissue Nursing Diagnoses: Impaired tissue integrity related to necrotic/devitalized tissue Knowledge deficit related to management of necrotic/devitalized tissue Goals: Necrotic/devitalized tissue will be minimized in the wound bed Date Initiated: 04/07/2021 Target Resolution Date: 10/08/2021 Goal Status: Active Patient/caregiver will verbalize understanding of reason and process for debridement of necrotic tissue Date Initiated: 04/07/2021 Date Inactivated: 09/22/2021 Target Resolution Date: 09/08/2021 Goal Status: Met Interventions: Assess patient pain  level pre-, during and post procedure and prior to discharge Provide education on necrotic tissue and debridement process Treatment Activities: Excisional debridement : 04/07/2021 Notes: Electronic Signature(s) Signed: 10/20/2021 4:21:58 PM By: Levora Dredge Entered By: Levora Dredge on 10/20/2021 10:53:03 [name], [name] ([id number]) -------------------------------------------------------------------------------- Pain Assessment Details Patient Name: Heather Dear A. Date of Service: 10/20/2021 10:30 AM Medical Record Number: [id number] Patient Account Number: [id number] Date of Birth/Sex: 05/01/1976 (45 y.o. F) Treating RN: Levora Dredge Primary Care Shaine Mount: Raelene Bott Other Clinician: Referring Anthon Harpole: Raelene Bott Treating Sarp Vernier/Extender: Yaakov Guthrie in Treatment: 30 Active Problems Location of Pain Severity and Description of Pain Patient Has Paino Yes Site Locations Rate the pain. Current Pain Level: 7 Pain Management and Medication Current Pain Management: Notes pt states pain normally at a 7/10 Electronic Signature(s) Signed: 10/20/2021 4:21:58 PM By: Levora Dredge Entered By: Levora Dredge on 10/20/2021 10:32:38 [name] ([id number]) -------------------------------------------------------------------------------- Patient/Caregiver Education Details Patient Name: Heather Dear A. Date of Service: 10/20/2021 10:30 AM Medical Record Number: [id number] Patient Account Number: [id number] Date of Birth/Gender: [birth year] (45 y.o. F) Treating RN: Levora Dredge Primary Care Physician: Raelene Bott Other Clinician: Referring Physician: Raelene Bott Treating Physician/Extender: Yaakov Guthrie in Treatment: 30 Education Assessment Education Provided To: Patient Education Topics Provided Wound Debridement: Handouts: Other: callus pairing Methods: Explain/Verbal Responses: State content correctly Electronic  Signature(s) Signed: 10/20/2021 4:21:58 PM By: Levora Dredge Entered By: Levora Dredge on 10/20/2021 11:12:48 [name], [name] ([id number]) -------------------------------------------------------------------------------- Wound Assessment Details Patient Name: Heather Dear A. Date of Service: 10/20/2021 10:30 AM Medical Record Number: [id number] Patient Account Number: [id number] Date of Birth/Sex: 03/24/1976 (45 y.o. F) Treating RN: Levora Dredge Primary Care Nolah Krenzer: Raelene Bott Other Clinician: Referring Darrian Goodwill: Raelene Bott Treating Ebin Palazzi/Extender: Yaakov Guthrie in Treatment: 30 Wound Status Wound Number: 7 Primary Diabetic Wound/Ulcer of the Lower Extremity Etiology: Wound Location: Left, Midline, Plantar Foot Wound Open Wounding Event: Gradually Appeared Status: Date Acquired: 08/07/2020 Comorbid Chronic sinus problems/congestion, Middle ear problems, Weeks Of Treatment: 30 History: Anemia, Chronic Obstructive Pulmonary Disease (COPD), Clustered Wound: No Congestive Heart Failure, Type II Diabetes, End Stage Renal Disease, History of pressure wounds, Neuropathy Photos Wound Measurements Length: (cm) 5.5 Width: (cm) 2 Depth: (cm) 0.4 Area: (cm) 8.639 Volume: (cm) 3.456 % Reduction in Area: -223.6% % Reduction in Volume: -158.9% Epithelialization: None Tunneling: No Undermining: No Wound Description Classification: Grade 2 Wound Margin: Thickened Exudate Amount: Large Exudate Type: Serosanguineous Exudate Color: red, brown Foul Odor After Cleansing: No Slough/Fibrino Yes Wound Bed Granulation Amount: Large (67-100%) Exposed Structure Granulation Quality: Pink, Hyper-granulation Fascia Exposed: No Necrotic Amount: Small (1-33%) Fat Layer (Subcutaneous Tissue) Exposed: Yes Necrotic Quality: Adherent Slough Tendon Exposed: No Muscle Exposed: No Joint Exposed: No Bone Exposed: No Treatment Notes Wound #7 (Foot) Wound Laterality:  Plantar, Left, Midline Cleanser Dakin 16 (oz) 0.25 Discharge Instruction: Use as directed. [name], [name] ([id number]) Peri-Wound Care Topical Primary Dressing Gauze Discharge  Instruction: As directed: moistened with Dakins Solution Secondary Dressing Secured With Kerlix Roll Sterile or Non-Sterile 6-ply 4.5x4 (yd/yd) Discharge Instruction: Apply Kerlix as directed Compression Wrap Compression Stockings Add-Ons Electronic Signature(s) Signed: 10/20/2021 4:21:58 PM By: Levora Dredge Entered By: Levora Dredge on 10/20/2021 10:44:52 [name], [name] ([id number]) -------------------------------------------------------------------------------- Wound Assessment Details Patient Name: Heather Dear A. Date of Service: 10/20/2021 10:30 AM Medical Record Number: [id number] Patient Account Number: [id number] Date of Birth/Sex: 1975-11-30 (45 y.o. F) Treating RN: Levora Dredge Primary Care Tamme Mozingo: Raelene Bott Other Clinician: Referring Munir Victorian: Raelene Bott Treating Shahidah Nesbitt/Extender: Yaakov Guthrie in Treatment: 30 Wound Status Wound Number: 9 Primary Diabetic Wound/Ulcer of the Lower Extremity Etiology: Wound Location: Right Calcaneus Wound Open Wounding Event: Gradually Appeared Status: Date Acquired: 07/18/2021 Comorbid Chronic sinus problems/congestion, Middle ear problems, Weeks Of Treatment: 13 History: Anemia, Chronic Obstructive Pulmonary Disease (COPD), Clustered Wound: No Congestive Heart Failure, Type II Diabetes, End Stage Renal Disease, History of pressure wounds, Neuropathy Photos Wound Measurements Length: (cm) 0.4 % Re Width: (cm) 0.4 % Re Depth: (cm) 0.2 Epit Area: (cm) 0.126 Tun Volume: (cm) 0.025 Und S E M duction in Area: 61.8% duction in Volume: 89.2% helialization: None neling: No ermining: Yes tarting Position (o'clock): 4 nding Position (o'clock): 8 aximum Distance: (cm) 0.2 Wound Description Classification: Grade 1  Foul Wound Margin: Well defined, not attached Slou Exudate Amount: Medium Exudate Type: Serosanguineous Exudate Color: red, brown Odor After Cleansing: No gh/Fibrino Yes Wound Bed Granulation Amount: Small (1-33%) Exposed Structure Granulation Quality: Pale Fascia Exposed: No Necrotic Amount: Large (67-100%) Fat Layer (Subcutaneous Tissue) Exposed: Yes Necrotic Quality: Eschar, Adherent Slough Tendon Exposed: No Muscle Exposed: No Joint Exposed: No Bone Exposed: No Treatment Notes Wound #9 (Calcaneus) Wound Laterality: Right Ilyas, Shanita A. ([id number]) Cleanser Soap and Water Discharge Instruction: Gently cleanse wound with antibacterial soap, rinse and pat dry prior to dressing wounds Peri-Wound Care Topical Primary Dressing Hydrofera Blue Ready Transfer Foam, 2.5x2.5 (in/in) Discharge Instruction: Apply Hydrofera Blue Ready to wound bed as directed Secondary Dressing Bordered Gauze Sterile-HBD 4x4 (in/in) Discharge Instruction: Cover wound with Bordered Guaze Sterile as directed Secured With coverlet Compression Wrap Compression Stockings Add-Ons Electronic Signature(s) Signed: 10/20/2021 4:21:58 PM By: Levora Dredge Entered By: Levora Dredge on 10/20/2021 10:43:32 [name], [name] ([id number]) -------------------------------------------------------------------------------- Vitals Details Patient Name: Heather Dear A. Date of Service: 10/20/2021 10:30 AM Medical Record Number: [id number] Patient Account Number: [id number] Date of Birth/Sex: Mar 04, 1976 (45 y.o. F) Treating RN: Levora Dredge Primary Care Rhyann Berton: Raelene Bott Other Clinician: Referring Keliah Harned: Raelene Bott Treating Inetta Dicke/Extender: Yaakov Guthrie in Treatment: 30 Vital Signs Time Taken: 10:28 Temperature (F): 98.2 Height (in): 69 Pulse (bpm): 77 Weight (lbs): 185 Respiratory Rate (breaths/min): 18 Body Mass Index (BMI): 27.3 Blood Pressure (mmHg):  106/78 Reference Range: 80 - 120 mg / dl Electronic Signature(s) Signed: 10/20/2021 4:21:58 PM By: Levora Dredge Entered By: Levora Dredge on 10/20/2021 10:32:20

## 2021-10-27 ENCOUNTER — Other Ambulatory Visit: Payer: Self-pay

## 2021-10-27 ENCOUNTER — Encounter: Payer: Medicaid Other | Attending: Internal Medicine | Admitting: Internal Medicine

## 2021-10-27 DIAGNOSIS — L97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ity: Secondary | ICD-10-CM | POA: Insufficient documentation

## 2021-10-27 DIAGNOSIS — E1142 Type 2 diabetes mellitus with diabetic polyneuropathy: Secondary | ICD-10-CM | POA: Insufficient documentation

## 2021-10-27 DIAGNOSIS — E11621 Type 2 diabetes mellitus with foot ulcer: Secondary | ICD-10-CM | POA: Insufficient documentation

## 2021-10-27 DIAGNOSIS — E11622 Type 2 diabetes mellitus with other skin ulcer: Secondary | ICD-10-CM | POA: Diagnosis not present

## 2021-10-27 DIAGNOSIS — L97812 Non-pressure chronic ulcer of other part of right lower leg with fat layer exposed: Secondary | ICD-10-CM | POA: Diagnosis not present

## 2021-10-27 DIAGNOSIS — L97512 Non-pressure chronic ulcer of other part of right foot with fat layer exposed: Secondary | ICD-10-CM | POA: Diagnosis not present

## 2021-10-27 DIAGNOSIS — L97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 of skin: Secondary | ICD-10-CM | POA: Insufficient documentation

## 2021-10-27 DIAGNOSIS — F17210 Nicotine dependence, cigarettes, uncomplicated: Secondary | ICD-10-CM | POA: Diagnosis not present

## 2021-10-27 NOTE — Progress Notes (Signed)
ANTHA, [name] ([id number]) Visit Report for 10/27/2021 Chief Complaint Document Details Patient Name: [name], [name] Date of Service: 10/27/2021 10:30 AM Medical Record Number: [id number] Patient Account Number: [id number] Date of Birth/Sex: November 15, 1975 (46 y.o. F) Treating RN: Levora Dredge Primary Care Provider: Raelene Bott Other Clinician: Referring Provider: Raelene Bott Treating Provider/Extender: Yaakov Guthrie in Treatment: 31 Information Obtained from: Patient Chief Complaint 03/19/2021; patient referred by Dr. Luana Shu who has been looking after her left foot for quite a period of time for review of a nonhealing area in the left midfoot Electronic Signature(s) Signed: 10/27/2021 12:20:20 PM By: Kalman Shan DO Entered By: Kalman Shan on 10/27/2021 12:12:02 [name] ([id number]) -------------------------------------------------------------------------------- Debridement Details Patient Name: Heather Dear A. Date of Service: 10/27/2021 10:30 AM Medical Record Number: [id number] Patient Account Number: [id number] Date of Birth/Sex: 1976-06-03 (46 y.o. F) Treating RN: Levora Dredge Primary Care Provider: Raelene Bott Other Clinician: Referring Provider: Raelene Bott Treating Provider/Extender: Yaakov Guthrie in Treatment: 31 Debridement Performed for Wound #9 Right Calcaneus Assessment: Performed By: Physician Kalman Shan, MD Debridement Type: Debridement Severity of Tissue Pre Debridement: Fat layer exposed Level of Consciousness (Pre- Awake and Alert procedure): Pre-procedure Verification/Time Out Yes - 11:15 Taken: Pain Control: Lidocaine 4% Topical Solution Total Area Debrided (L x W): 0.4 (cm) x 0.4 (cm) = 0.16 (cm) Tissue and other material Viable, Non-Viable, Callus, Eschar, Subcutaneous debrided: Level: Skin/Subcutaneous Tissue Debridement Description: Excisional Instrument: Curette Bleeding:  Minimum Hemostasis Achieved: Pressure Response to Treatment: Procedure was tolerated well Level of Consciousness (Post- Awake and Alert procedure): Post Debridement Measurements of Total Wound Length: (cm) 1 Width: (cm) 0.7 Depth: (cm) 0.4 Volume: (cm) 0.22 Character of Wound/Ulcer Post Debridement: Stable Severity of Tissue Post Debridement: Fat layer exposed Post Procedure Diagnosis Same as Pre-procedure Electronic Signature(s) Signed: 10/27/2021 12:20:20 PM By: Kalman Shan DO Signed: 10/27/2021 4:31:39 PM By: Levora Dredge Entered By: Levora Dredge on 10/27/2021 11:20:15 Heather Dear A. ([id number]) -------------------------------------------------------------------------------- Debridement Details Patient Name: Heather Dear A. Date of Service: 10/27/2021 10:30 AM Medical Record Number: [id number] Patient Account Number: [id number] Date of Birth/Sex: 06-21-1976 (46 y.o. F) Treating RN: Levora Dredge Primary Care Provider: Raelene Bott Other Clinician: Referring Provider: Raelene Bott Treating Provider/Extender: Yaakov Guthrie in Treatment: 31 Debridement Performed for Wound #7 Left,Midline,Plantar Foot Assessment: Performed By: Physician Kalman Shan, MD Debridement Type: Debridement Severity of Tissue Pre Debridement: Fat layer exposed Level of Consciousness (Pre- Awake and Alert procedure): Pre-procedure Verification/Time Out Yes - 11:21 Taken: Pain Control: Lidocaine 4% Topical Solution Total Area Debrided (L x W): 4.6 (cm) x 2.1 (cm) = 9.66 (cm) Tissue and other material Viable, Non-Viable, Callus, Slough, Subcutaneous, Slough debrided: Level: Skin/Subcutaneous Tissue Debridement Description: Excisional Instrument: Curette Bleeding: Minimum Hemostasis Achieved: Pressure Response to Treatment: Procedure was tolerated well Level of Consciousness (Post- Awake and Alert procedure): Post Debridement Measurements of Total  Wound Length: (cm) 4.6 Width: (cm) 2.1 Depth: (cm) 0.4 Volume: (cm) 3.035 Character of Wound/Ulcer Post Debridement: Stable Severity of Tissue Post Debridement: Fat layer exposed Post Procedure Diagnosis Same as Pre-procedure Electronic Signature(s) Signed: 10/27/2021 12:20:20 PM By: Kalman Shan DO Signed: 10/27/2021 4:31:39 PM By: Levora Dredge Entered By: Levora Dredge on 10/27/2021 11:25:02 Heather Dear A. ([id number]) -------------------------------------------------------------------------------- HPI Details Patient Name: Heather Dear A. Date of Service: 10/27/2021 10:30 AM Medical Record Number: [id number] Patient Account Number: [id number] Date of Birth/Sex: 1976-06-17 (46 y.o. F) Treating RN: Levora Dredge Primary Care Provider: Raelene Bott Other Clinician: Referring Provider: Raelene Bott Treating Provider/Extender: Heber Goodell,  Loni Muse in Treatment: 31 History of Present Illness HPI Description: 01/18/18-She is here for initial evaluation of the left great toe ulcer. She is a poor historian in regards to timeframe in detail. She states approximately 4 weeks ago she lacerated her toe on something in the house. She followed up with her primary care who placed her on Bactrim and ultimately a second dose of Bactrim prior to coming to wound clinic. She states she has been treating the toe with peroxide, Betadine and a Band-Aid. She did not check her blood sugar this morning but checked it yesterday morning it was 327; she is unaware of a recent A1c and there are no current records. She saw Dr. she would've orthopedics last week for an old injury to the left ankle, she states he did not see her toe, nor did she bring it to his attention. She smokes approximately 1 pack cigarettes a day. Her social situation is concerning, she arrives this morning with her mother who appears extremely intoxicated/under the influence; her mother was asked to leave the room and be  monitored by the patient's grandmother. The patient's aunt then accompanied the patient and the room throughout the rest of the appointment. We had a lengthy discussion regarding the deleterious effects of uncontrolled hyperglycemia and smoking as it relates to wound healing and overall health. She was strongly encouraged to decrease her smoking and get her diabetes under better control. She states she is currently on a diet and has cut down her [facility] consumption. The left toe is erythematous, macerated and slightly edematous with malodor present. The edema in her left foot is below her baseline, there is no erythema streaking. We will treat her with Santyl, doxycycline; we have ordered and xray, culture and provided a Peg assist surgical shoe and cultured the wound. 01/25/18-She is here in follow-up evaluation for a left great toe ulcer and presents with an abscess to her suprapubic area. She states her blood sugars remain elevated, feeling "sick" and if levels are below 250, but she is trying. She has made no attempt to decrease her smoking stating that we "can't take away her food in her cigarettes". She has been compliant with offloading using the PEG assist you. She is using Santyl daily. the culture obtained last week grew staph aureus and Enterococcus faecalis; continues on the doxycycline and Augmentin was added on Monday. The suprapubic area has erythema, no femoral variation, purple discoloration, minimal induration, was accessed with a cotton tip applicator with sanguinopurulent drainage, this was cultured, I suspect the current antibiotic treatment will cover and we will not add anything to her current treatment plan. She was advised to go to urgent care or ER with any change in redness, induration or fever. 02/01/18-She is here in follow-up evaluation for left great toe ulcers and a new abdominal abscess from last week. She was able to use packing until earlier this week, where she  "forgot it was there". She states she was feeling ill with GI symptoms last week and was not taking her antibiotic. She states her glucose levels have been predominantly less than 200, with occasional levels between 200-250. She thinks this was contributing to her GI symptoms as they have resolved without intervention. There continues to be significant laceration to left toe, otherwise it clinically looks stable/improved. There is now less superficial opening to the lateral aspect of the great toe that was residual blister. We will transition to [facility] to all wounds, she will continue  her Augmentin. If there is no change or deterioration next week for reculture. 02/08/18-She is here in follow-up evaluation for left great toe ulcer and abdominal ulcer. There is an improvement in both wounds. She has been wrapping her left toe with coban, not by our direction, which has created an area of discoloration to the medial aspect; she has been advised to NOT use coban secondary to her neuropathy. She states her glucose levels have been high over this last week ranging from 200-350, she continues to smoke. She admits to being less compliant with her offloading shoe. We will continue with same treatment plan and she will follow-up next week. 02/15/18-She is here in follow-up evaluation for left great toe ulcer and abdominal ulcer. The abdominal ulcer is epithelialized. The left great toe ulcer is improved and all injury from last week using the Coban wrap is resolved, the lateral ulcer is healed. She admits to noncompliance with wearing offloading shoe and admits to glucose levels being greater than 300 most of the week. She continues to smoke and expresses no desire to quit. There is one area medially that probes deeper than it has historically, erythema to the toe and dorsal foot has consistently waxed and waned. There is no overt signs of cellulitis or infection but we will culture the wound for any  occult infection given the new area of depth and erythema. We will hold off on sensitivities for initiation of antibiotic therapy. 02/22/18-She is here in follow up evaluation for left great toe ulcer. There is overall significant improvement in both wound appearance, erythema and edema with changes made last week. She was not initiated on antibiotic therapy. Culture obtained last week showed oxacillin sensitive staph aureus, sensitive to clindamycin. Clindamycin has been called into the pharmacy but she has been instructed to hold off on initiation secondary to overall clinical improvement and her history of antibiotic intolerance. She has been instructed to contact the clinic with any noted changes/deterioration and the wound, erythema, edema and/or pain. She will follow-up next week. She continues to smoke and her glucose levels remain elevated >250; she admits to compliance with offloading shoe 03/01/18 on evaluation today patient appears to be doing fairly well in regard to her left first toe ulcer. She has been tolerating the dressing changes with the [facility] Dressing without complication and overall this has definitely showed signs of improvement according to records as well is what the patient tells me today. I'm very pleased in that regard. She is having no pain today 03/08/18 She is here for follow up evaluation of a left great toe ulcer. She remains non-compliant with glucose control and smoking cessation; glucose levels consistently >200. She states that she got new shoe inserts/peg assist. She admits to compliance with offloading. Since my last evaluation there is significant improvement. We will switch to prisma at this time and she will follow up next week. She is noted to be tachycardic at this appointment, heart rate 120s; she has a history of heart rate 70-130 according to our records. She admits to extreme agitation r/t personal issues; she was advised to monitor her heartrate and  contact her physician if it does not return to a more normal range (<100). She takes cardizem twice daily. 03/15/18-She is here in follow-up evaluation for left great toe ulcer. She remains noncompliant with glucose control and smoking cessation. She admits to compliance with wearing offloading shoe. The ulcer is improved/stable and we will continue with the same treatment plan and  she will follow-up next week 03/22/18-She is here for evaluation for left great toe ulcer. There continues to be significant improvement despite recurrent hyperglycemia (over 500 yesterday) and she continues to smoke. She has been compliant with offloading and we will continue with same treatment plan and she will follow-up next week. 03/29/18-She is here for evaluation for left great toe ulcer. Despite continuing to smoke and uncontrolled diabetes she continues to improve. She is compliant with offloading shoe. We will continue with the same treatment plan and she will follow-up next week 04/05/18- She is here in follow up evaluation for a left great toe ulcer; she presents with small pustule to left fifth toe (resembles ant bite). She admits to compliance with wearing offloading shoe; continues to smoke or have uncontrolled blood glucose control. There is more callus than usual with evidence of bleeding; she denies known trauma. 04/12/18-She is here for evaluation of left great toe ulcer. Despite noncompliance with glycemic control and smoking she continues to make Dueitt, Tameaka A. ([id number]) improvement. She continues to wear offloading shoe. The pustule, that was identified last week, to the left fifth toe is resolved. She will follow-up in 2 weeks 05/03/18-she is seen in follow-up evaluation for a left great toe ulcer. She is compliant with offloading, otherwise noncompliant with glycemic control and smoking. She has plateaued and there is minimal improvement noted. We will transition to [facility], replaced the  insert to her surgical shoe and she will follow-up in one week 05/10/18- She is here in follow up evaluation for a left great toe ulcer. It appears stable despite measurement change. We will continue with same treatment plan and follow up next week. 05/24/18-She is seen in follow-up evaluation for a left great toe ulcer. She remains compliant with offloading, has made significant improvement in her diet, decreasing the amount of sugar/soda. She said her recent A1c was 10.9 which is lower than. She did see a diabetic nutritionist/educator yesterday. She continues to smoke. We will continue with the same treatment plan and she'll follow-up next week. 05/31/18- She is seen in follow-up evaluation for left great toe ulcer. She continues to remain compliant with offloading, continues to make improvement in her diet, increasing her water and decreasing the amount of sugar/soda. She does continue to smoke with no desire to quit. We will apply Prisma to the depth and Hydrofera Blue over. We have not received insurance authorization for oasis. She will follow up next week. 06/07/18-She is seen in follow-up evaluation for left great toe ulcer. It has stalled according to today's measurements although base appears stable. She says she saw a diabetic educator yesterday; her average blood sugars are less than 300 which is an improvement for her. She continues to smoke and states "that's my next step" She continues with water over soda. We will order for xray, culture and reinstate ace wrap compression prior to placing apligraf for next week. She is voicing no complaints or concerns. Her dressing will change to iodoflex over the next week in preparation for apligraf. 06/14/18-She is seen in follow-up evaluation for left great toe ulcer. Plain film x-ray performed last week was negative for osteomyelitis. Wound culture obtained last week grew strep B and OSSA; she is initiated on keflex and cefdinir today; there is  erythema to the toe which could be from ace wrap compression, she has a history of wrapping too tight and has has been encouraged to maintain ace wraps that we place today. We will hold off on  application of apligraf today, will apply next week after antibiotic therapy has been initiated. She admits today that she has resumed taking a shower with her foot/toe submerged in water, she has been reminded to keep foot/toe out of the bath water. She will be seen in follow up next week 06/21/18-she is seen in follow-up evaluation for left great toe ulcer. She is tolerating antibiotic therapy with no GI disturbance. The wound is stable. Apligraf was applied today. She has been decreasing her smoking, only had 4 cigarettes yesterday and 1 today. She continues being more compliant in diabetic diet. She will follow-up next week for evaluation of site, if stable will remove at 2 weeks. 06/28/18- She is here in follow up evalution. Apligraf was placed last week, she states the dressing fell off on Tuesday and she was dressing with hydrofera blue. She is healed and will be discharged from the clinic today. She has been instructed to continue with smoking cessation, continue monitoring glucose levels, offloading for an additional 4 weeks and continue with hydrofera blue for additional two weeks for any possible microscopic opening. Readmission: 08/07/18 on evaluation today patient presents for reevaluation concerning the ulcer of her right great toe. She was previously discharged on 06/28/18 healed. Nonetheless she states that this began to show signs of drainage she subsequently went to her primary care provider. Subsequently an x-ray was performed on 08/01/18 which was negative. The patient was also placed on antibiotics at that time. Fortunately they should have been effective for the infection. Nonetheless she's been experiencing some improvement but still has a lot of drainage coming from the wound  itself. 08/14/18 on evaluation today patient's wound actually does show signs of improvement in regard to the erythema at this point. She has completed the antibiotics. With that being said we did discuss the possibility of placing her in a total contact cast as of today although I think that I may want to give this just a little bit more time to ensure nothing recurrence as far as her infection is concerned. I do not want to put in the cast and risk infection at that time if things are not completely resolved. With that being said she is gonna require some debridement today. 08/21/18 on evaluation today patient actually appears to be doing okay in regard to her toe ulcer. She's been tolerating the dressing changes without complication. With that being said it does appear that she is ready and in fact I think it's appropriate for [place] to go ahead and initiate the total contact cast today. Nonetheless she will require some sharp debridement to prepare the wound for application. Overall I feel like things have been progressing well but we do need to do something to get this to close more readily. 08/24/18 patient seen today for reevaluation after having had the total contact cast applied on Tuesday. She seems to have done very well the wound appears to be doing great and overall I'm pleased with the progress that she's made. There were no abnormal areas of rubbing from the cast on her lower extremity. 08/30/18 on evaluation today patient actually appears to be completely healed in regard to her plantar toe ulcer. She tells me at this point she's been having a lot of issues with the cast. She almost fell a couple of times the state shall the step of her dog a couple times as well. This is been a very frustrating process for her other nonetheless she has completely healed the wound  which is excellent news. Overall there does not appear to be the evidence of infection at this time which is great  news. 09/11/18 evaluation today patient presents for follow-up concerning her great toe ulcer on the left which has unfortunately reopened since I last saw her which was only a couple of weeks ago. Unfortunately she was not able to get in to get the shoe and potentially the AFO that's gonna be necessary due to her left foot drop. She continues with offloading shoe but this is not enough to prevent her from reopening it appears. When we last had her in the total contact cast she did well from a healing standpoint but unfortunately the wound reopened as soon as she came out of the cast within just a couple of weeks. 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 This is also part of the reason why she falls on a regular basis. I just do not believe that is gonna be safe for [place] to reinitiate the total contact cast as last time we had this on she fell 3 times one week which is definitely not normal for her. 09/18/18 upon evaluation today the patient actually appears to be doing about the same in regard to her toe ulcer. She did not contact [employer] as I asked her to even though I had given her the prescription. In fact she actually states that she has no idea where the prescription is. She did apparently call [employer] and they told her that all she needed to do was bring the prescription in order to be able to be seen and work on getting the AFO for her left foot. With all that being said she still does not have an appointment and I'm not sure were things stand that regard. I will give her a new prescription today in order to contact them to get this set up. 09/25/18 on evaluation today patient actually appears to be doing about the same in regard to her toes ulcer. She does have a small areas which seems to have a  lot of callous buildup around the edge of the wound which is going to need sharp debridement today. She still is waiting to be scheduled for evaluation with [employer] for possibility of an AFO. She states there supposed to call her tomorrow to get this set up. Unfortunately it does appear that her foot specifically the toe area is showing signs of erythema. There does not appear to be any systemic infection which is in these good news. LILIBETH, OPIE A. ([id number]) 10/02/18 on evaluation today patient actually appears to be doing about the same in regard to her toe ulcer. This really has not done too well although it's not significantly larger it's also not significantly smaller. She has been tolerating the dressing changes without complication. She actually has her appointment with [facility] and [place] tomorrow to hopefully be measured for obtaining and AFO splint. I think this would be helpful preventing this from reoccurring. We had contemplated starting the cast this week although to be honest I am reluctant to do that as she's been having nausea, vomiting, and seizure activity over the past three days. She has a history of seizures and have been told is nothing that can be done for these. With that  being said I do believe that along with the seizures have the nausea vomiting which upon further questioning doesn't seem to be the normal for her and makes me concerned for the possibility of infection or something else going on. I discussed this with the patient and her mother during the office visit today. I do not feel the wound is effective but maybe something else. The responses this was "this just happens to her at times and we don't know why". They did not seem to be interested in going to the hospital to have this checked out further. 10/09/18 on evaluation today patient presents for follow-up concerning her ongoing toe ulcer. She has been tolerating the dressing changes without complication.  Fortunately there does not appear to be any evidence of infection which is great news however I do think that the patient would benefit from going ahead for with the total contact cast. She's actually in a wheelchair today she tells me that she will use her walker if we initiate the cast. I was very specific about the fact that if we were gonna do the cast I wanted to make sure that she was using the walker in order to prevent any falls. She tells me she does not have stairs that she has to traverse on a regular basis at her home. She has not had any seizures since last week again that something that happens to her often she tells me she did talk to [employer] and they said that it may take up to three weeks to get the brace approved for her. Hopefully that will not take that long but nonetheless in the meantime I do think the cast could be of benefit. 10/12/18 on evaluation today patient appears to be doing rather well in regard to her toe ulcer. It's just been a few days and already this is significantly improved both as far as overall appearance and size. Fortunately there's no sign of infection. She is here for her first obligatory cast change. 10/19/18 Seen today for follow up and management of left great toe ulcer. Wound continues to show improvement. Noted small open area with seroussang drainage with palpation. Denies any increased pain or recent fevers during visit. She will continue calcium alginate with offloading shoe. Denies any questions or concerns during visit. 10/26/18 on evaluation today patient appears to be doing about the same as when I last saw her in regard to her wound bed. Fortunately there does not appear to be any signs of infection. Unfortunately she continues to have a breakdown in regard to the toe region any time that she is not in the cast. It takes almost no time at all for this to happen. Nonetheless she still has not heard anything from the brace being made by [employer]  as to when exactly this will be available to her. Fortunately there is no signs of infection at this time. 10/30/18 on evaluation today patient presents for application of the total contact cast as we just received him this morning. Fortunately we are gonna be able to apply this to her today which is great news. She continues to have no significant pain which is good news. Overall I do feel like things have been improving while she was the cast is when she doesn't have a cast that things get worse. She still has not really heard anything from [facility] regarding her brace. 11/02/18 upon evaluation today patient's wound already appears to be doing significantly better which is good news. Fortunately  there does not appear to be any signs of infection also good news. Overall I do think the total contact cast as before is helping to heal this area unfortunately it's just not gonna likely keep the area closed and healed without her getting her brace at least. Again the foot drop is a significant issue for her. 11/09/18 on evaluation today patient appears to be doing excellent in regard to her toe ulcer which in fact is completely healed. Fortunately we finally got the situation squared away with the paperwork which was needed to proceed with getting her brace approved by Medicaid. I have filled that out unfortunately that information has been sent to the orthopedic office that I worked at 2 1/2 years ago and not tired Current wound care measures. Fortunately she seems to be doing very well at this time. 11/23/18 on evaluation today patient appears to be doing More Poorly Compared to Last Time I Saw Her. At [facility] She Had Completely Healed. Currently she is continuing to have issues with reopening. She states that she just found out that the brace was approved through Medicaid now she just has to go get measured in order to have this fitted for her and then made. Subsequently she does not have an  appointment for this yet that is going to complicate things we obviously cannot put her back in the cast if we do not have everything measured because they're not gonna be able to measure her foot while she is in the cast. 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 Obviously this is unfortunate and does have me somewhat worried as well. 11/30/18 on evaluation today patient's toe ulcer actually appears to be doing fairly well. The good news is she will be getting her brace in the shoes next week on Wednesday. Hopefully we will be able to get this to heal without having to go back in the cast however she may need the cast in order to get the wound completely heal and then go from there. Fortunately there's no signs of infection at this time. 12/07/18 on evaluation today patient fortunately did receive her brace and she states she could tell this definitely makes her walk better. With that being said she's been having issues with her toe where she noticed yesterday there was a lot of tissue that was loosing off this appears to be much larger than what it was previous. She also states that her leg has been read putting much across the top of her foot just about the ankle although this seems to be receiving somewhat. The total area is still red and appears to be someone infected as best I can tell. She is previously taken Bactrim and that may be a good option for her today as well. We are gonna see what I wound culture shows as well and I think that this is definitely appropriate. With that being said outside of the culture I still need to initiate something in the interim and that's what I'm gonna go ahead and select Bactrim is a good option for her. 12/14/18 on evaluation today patient appears to be doing better in regard to her left great toe ulcer as compared to last week's evaluation. There's still some  erythema although this is significantly improved which is excellent news. Overall I do believe that she is making good progress is still gonna take  some time before she is where I would like her to be from the standpoint of being able to place her back into the total contact cast. Hopefully we will be where we need to be by next week. 12/21/18 on evaluation today patient actually appears to be doing poorly in regard to her toe ulcer. She's been tolerating the dressing changes without complication. Fortunately there's no signs of systemic infection although she does have a lot of drainage from the toe ulcer and this does seem to be causing some issues at this point. She does have erythema on the distal portion of her toe that appears to be likely cellulitis. 12/28/18 on evaluation today patient actually appears to be doing a little better in my pinion in regard to her toe ulcer. With that being said she still does have some evidence of infection at this time and for her culture she had both E. coli as well as enterococcus as organisms noted on evaluation. For that reason I think that though the Keflex likely has treated the E. coli rather well this has really done nothing for the enterococcus. We are going to have to initiate treatment for this specifically. OBELIA, BONELLO A. ([id number]) 01/04/19 on evaluation today patient's toe actually appears to be doing better from the standpoint of infection. She currently would like to see about putting the cash back on I think that this is appropriate as long as she takes care of it and keeps it from getting wet. She is gonna have some drainage we can definitely pass this up with Drawtex and alginate to try to prevent as much drainage as possible from causing the problems. With that being said I do want to at least try her with the cast between now and Tuesday. If there any issues we can't continue to use it then I will discontinue the use of the cast at that  point. 01/08/19 on evaluation today patient actually appears to be doing very well as far as her foot ulcer specifically the great toe on the left is concerned. She did have an area of rubbing on the medial aspect of her left ankle which again is from the cast. Fortunately there's no signs of infection at this point in this appears to be a very slight skin breakdown. The patient tells me she felt it rubbing but didn't think it was that bad. Fortunately there is no signs of active infection at this time which is good news. No fevers, chills, nausea, or vomiting noted at this time. 01/15/19 on evaluation today patient actually appears to be doing well in regard to her toe ulcer. Again as previous she seems to do well and she has the cast on which indicates to me that during the time she doesn't have a cast on she's putting way too much pressure on this region. Obviously I think that's gonna be an issue as with the current national emergency concerning the Covid-19 Virus it has been recommended that we discontinue the use of total contact casting by the chief medical officer of our company, Dr. Simona Huh. The reasoning is that if a patient becomes sick and cannot come into have the cast removed they could not just leave this on for an additional two weeks. Obviously the hospitals also do not want to receive patient's who are sick into the emergency department to potentially contaminate the region and spread the Covid-19 Virus among other sick individuals within the hospital system. Therefore at this point we are suspending  the use of total contact cast until the current emergency subsides. This was all discussed with the patient today as well. 01/22/19 on evaluation today patient's wound on her left great toe appears to be doing slightly worse than previously noted last week. She tells me that she has been on this quite a bit in fact she tells me she's been awake for 38 straight hours. This is due to the fact  that she's having to care for grandparents because nobody else will. She has been taking care of them for five the last seven days since I've seen her they both have dementia his is from a stroke and her grandmother's was progressive. Nonetheless she states even her mom who knows her condition and situation has only help two of those days to take care of them she's been taking care of the rest. Fortunately there does not appear to be any signs of active infection in regard to her toe at this point although obviously it doesn't look as good as it did previous. I think this is directly related to her not taking off the pressure and friction by way of taking things easy. Though I completely understand what's going on. 01/29/19 on evaluation today patient's tools are actually appears to be showing some signs of improvement today compared to last week's evaluation as far as not necessarily the overall size of the wound but the fact that she has some new skin growth in between the two ends of the wound opening. Overall I feel like she has done well she states that she had a family member give her what sounds to be a CAM walker boot which has been helpful as well. 02/05/19 on evaluation today patient's wound bed actually appears to be doing significantly better in regard to her overall appearance of the size of the wound. With that being said she is still having an issue with offloading efficiently enough to get this to close. Apparently there is some signs of infection at this point as well unfortunately. Previously she's done well of Augmentin I really do not see anything that needs to be culture currently but there theme and cellulitis of the foot that I'm seeing I'm gonna go ahead and place her on an antibiotic today to try to help clear this up. 02/12/2019 on evaluation today patient actually appears to be doing poorly in regard to her overall wound status. She tells me she has been using her offloading  shoe but actually comes in today wearing her tennis shoe with the AFO brace. Again as I previously discussed with her this is really not sufficient to allow the area to heal appropriately. Nonetheless she continues to be somewhat noncompliant and I do wonder based on what she has told my nurse in the past as to whether or not a good portion of this noncompliance may be recreational drug and alcohol related. She has had a history of heroin overdose and this was fairly recently in the past couple of months that have been seeing her. Nonetheless overall I feel like her wound looks significantly worse today compared to what it was previous. She still has significant erythema despite the Augmentin I am not sure that this is an appropriate medication for her infection I am also concerned that the infection may have gone down into her bone. 02/19/19 on evaluation today patient actually appears to be doing about the same in regard to her toe ulcer. Unfortunately she continues to show signs of bone exposure  and infection at this point. There does not appear to be any evidence of worsening of the infection but I'm also not really sure that it's getting significantly better. She is on the Augmentin which should be sufficient for the Staphylococcus aureus infection that she has at this point. With that being said she may need IV antibiotics to more appropriately treat this. We did have a discussion today about hyperbaric option therapy. 02/28/19 on evaluation today patient actually appears to be doing much worse in regard to the wound on her left great toe as compared to even my previous evaluation last week. Unfortunately this seems to be training in a pretty poor direction. Her toe was actually now starting to angle laterally and I can actually see the entire joint area of the proximal portion of the digit where is the distal portion of the digit again is no longer even in contact with the joint line. Unfortunately  there's a lot more necrotic tissue around the edge and the toe appears to be showing signs of becoming gangrenous in my pinion. I'm very concerned about were things stand at this point. She did see infectious disease and they are planning to send in a prescription for Sivextro for her and apparently this has been approved. With that being said I don't think she should avoid taking this but at the same time I'm not sure that it's gonna be sufficient to save her toe at this point. 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 03/05/19 on evaluation today patient actually appears to be doing quite well in regard to her toe all things considering. She still has bone exposed but there appears to be much less your thing on overall the appearance of the wound and the toe itself is dramatically improved. She still does have some issues currently obviously with infection she did see vascular as well and there concerned that her blood flow to the toad. For that reason they are setting up for an angiogram next week. 03/14/19 on evaluation today patient appears to be doing very poor in regard to her toe and specifically in regard to the ulceration and the fact that she's starting to notice the toe was leaning even more towards the lateral aspect and the complete joint is visible on the proximal aspect of the joint. Nonetheless she's also noted a significant odor and the tip of the toe is turning more dark and necrotic appearing. Overall I think she is getting worse not better as far as this is concerned. For that reason I am recommending at this point that she likely needs to be seen for likely amputation. READMISSION 03/19/2021 This is a patient that we cared for in this clinic for a prolonged period of time in  2019 and 2020 with a left foot and left first toe wound. 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 ([id number]) 04/09/20 by Dr. Luana Shu. In December 2021 she had an ulcer on her right great toe as well as the fourth and fifth toes. 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 She also had an angiogram at that time and underwent angioplasty  of the right anterior tibial artery. In any case she claims that the wound on the right foot is closed I did not look at this today which was probably an oversight although I think that should be done next week. After her surgery she developed a dehiscence but I do not see any follow-up of this. According to Dr. Deborra Medina last review that she was out of the area being cared for by another physician but recently came back to his attention. 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 A culture of this area showed E. coli apparently before she came back to see Dr. Luana Shu she was supposed to be receiving antibiotics but she did not really take them. Nor is she offloading this area at all. Finally her last hemoglobin A1c listed in epic was in March 2022 at 14.1 she says things are a lot better since then although I am not sure. 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 She had this wound at the time. During this admission she had E. coli sepsis a TEE was negative. 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 She is still a heavy cigarette smoker. She has a history of bipolar  disorder seizure disorder ABI in our clinic was 1.05 6/1; left midfoot in the setting of a TMA done previously. Round circular wound with a "knuckle" of protruding tissue. The problem is that the knuckle was not attached to any of the surrounding granulation and this probed proximally widely I removed a large portion of this tissue. This wound goes with considerable undermining laterally. I do not feel any bone there was no purulence but this is a deep wound. 6/8; in spite of the debridement I did last week. She arrives with a wound looking exactly the same. A protruding "knuckle" of tissue nonadherent to most of the surrounding tissue. There is considerable depth around this from 6-12 o'clock at 2.7 cm and undermining of 1 cm. This does not look overtly infected and the x-ray I did last week was negative for any osseous abnormalities. We have been using silver collagen 6/15; deep tissue culture I did last week showed moderate staph aureus and moderate Pseudomonas. This will definitely require prolonged antibiotic therapy. The pathology on the protuberant area was negative for malignancy fungus etc. 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 We have been using silver collagen. I am going to be prescribing Levaquin for 2 weeks. Her CT scan of the foot is down for 7/5 6/22; CT scan of the foot on 7 5. She says she has hardware in the left leg from her previous fracture. She is on the Levaquin for the deep tissue culture I did that showed methicillin sensitive staph aureus and Pseudomonas. I gave her a 2-week supply and she will have another week. She arrives in clinic today with the same protuberant tissue however this is nonadherent to the tissue surrounding it. I am really at a loss to explain this unless there is underlying deep tissue infection 6/29; patient presents for 1 week follow-up. She has been using collagen to the wound bed. She  reports taking her antibiotics as prescribed.She has no complaints or issues today.  She denies signs of infection. 7/6; patient presents for one week followup. She has been using collagen to the wound bed. She states she is taking Levaquin however at times she is not able to keep it down. She denies signs of infection. 7/13; patient presents for 1 week follow-up. She has been using silver alginate to the wound bed. She still has nausea when taking her antibiotics. She denies signs of infection. 7/20; patient presents for 1 week follow-up. She has been using silver alginate with gentamicin cream to the wound bed. She denies any issues and has no complaints today. She denies signs of infection. 7/27; patient presents for 1 week follow-up. She continues to use silver alginate with gentamicin cream to the wound bed. She reports starting her antibiotics. She has no issues or complaints. Overall she reports stability to the wound. 8/3; patient presents for 1 week follow-up. She has been using silver alginate with gentamicin cream to the wound bed. She reports completing all antibiotics. She has no issues or complaints today. She denies signs of infection. 8/17; patient presents for 2-week follow-up. He is to use silver alginate to the wound bed. She has no issues or complaints today. She denies signs of infection. She reports her pain has improved in her foot since last clinic visit 8/24; patient presents for 1 week follow-up. She continues to use silver alginate to the wound bed. She has no issues or complaints. She denies signs of infection. Pain is stable. 9/7; patient presents for follow-up. She missed her last week appointment due to feeling ill. She continues to use silver alginate. She has a new wound to the right lower extremity that is covered in eschar. She states It occurred over the past week and has no idea how it started. She currently denies signs of infection. 9/14; patient presents for  follow-up. To the left foot wound she has been using gentamicin cream and silver alginate. To the right lower extremity wound she has been keeping this covered and has not obtain Santyl. 9/21; patient presents for follow-up. She reports using gentamicin cream and silver alginate to the left foot and Santyl to the right lower extremity wound. She has no issues or complaints today. She denies signs of infection. 9/28; patient presents for follow-up. She reports a new wound to her right heel. She states this occurred a few days ago and is progressively gotten worse. She has been trying to clean the area with a Q-tip and Santyl. She reports stability in the other 2 wounds. She has been using gentamicin cream and silver alginate to the left foot and Santyl to the right lower extremity wound. 10/12; patient presents for follow-up. She reports improvement to the wound beds. She is seeing vein and vascular to discuss the potential of a left BKA. She states they are going to do an arteriogram. She continues to use silver alginate with dressing changes to her wounds. 11/2; patient presents for follow-up. She states she has not been doing dressing changes to the wound beds. She states she is not able to offload the areas. She reports chronic pain to her left foot wound. 11/9; patient presents for follow-up. She came in with only socks on. She states she forgot to put on shoes. It is unclear if she is doing any dressing changes. She currently denies systemic signs of infection. 11/16; patient presents for follow-up. She came again only with socks on. She states she does not wear shoes ever. It is unclear if she  does dressing changes. She currently denies systemic signs of infection. 11/23; patient presents for follow-up. She wore her shoes today. It still unclear exactly what dressing she is using for each wound but she did states she obtained Dakin's solution and has been using this to the left foot wound. She  currently denies signs of infection. 11/30; patient presents for follow-up. She has no issues or complaints today. She currently denies signs of infection. 12/7; patient presents for follow-up. She has no issues or complaints today. She has been using Hydrofera Blue to the right heel wound and Dakin solution to the left foot wound. Her right anterior leg wound is healed. She currently denies signs of infection. [name], [name] ([id number]) 12/14; patient presents for follow-up. She has been using Hydrofera Blue to the right heel and Dakin's to the left foot wounds. She has no issues or complaints today. She denies signs of infection. 12/21; patient presents for follow-up. She reports using Hydrofera Blue to the right heel and Dakin's to the left foot wound. She denies signs of infection. 12/28; patient presents for follow-up. She continues to use Dakin's to the left foot wound and Hydrofera Blue to the right heel wound. She denies signs of infection. 1/4; patient presents for follow-up. She has no issues or complaints today. She denies signs of infection. Electronic Signature(s) Signed: 10/27/2021 12:20:20 PM By: Kalman Shan DO Entered By: Kalman Shan on 10/27/2021 12:15:24 [name] ([id number]) -------------------------------------------------------------------------------- Physical Exam Details Patient Name: Heather Dear A. Date of Service: 10/27/2021 10:30 AM Medical Record Number: [id number] Patient Account Number: [id number] Date of Birth/Sex: August 13, 1976 (46 y.o. F) Treating RN: Levora Dredge Primary Care Provider: Raelene Bott Other Clinician: Referring Provider: Raelene Bott Treating Provider/Extender: Yaakov Guthrie in Treatment: 31 Constitutional . Cardiovascular . Psychiatric . Notes Left foot (Transmetatarsal amputation): Wound to the plantar aspect that has granulation tissue with scant nonviable tissue and increased pockets of depth. There  is circumferential callus. Right foot: To the heel there is an open wound with Granulation tissue , Nonviable tissue and Circumferential callus. No obvious signs of infection to any wound. Electronic Signature(s) Signed: 10/27/2021 12:20:20 PM By: Kalman Shan DO Entered By: Kalman Shan on 10/27/2021 12:16:00 [name] ([id number]) -------------------------------------------------------------------------------- Physician Orders Details Patient Name: Heather Dear A. Date of Service: 10/27/2021 10:30 AM Medical Record Number: [id number] Patient Account Number: [id number] Date of Birth/Sex: 01/13/1976 (46 y.o. F) Treating RN: Levora Dredge Primary Care Provider: Raelene Bott Other Clinician: Referring Provider: Raelene Bott Treating Provider/Extender: Yaakov Guthrie in Treatment: 64 Verbal / Phone Orders: No Diagnosis Coding Follow-up Appointments o Return Appointment in 1 week. Bathing/ Shower/ Hygiene o Wash wounds with antibacterial soap and water. Off-Loading Left Lower Extremity o Open toe surgical shoe with peg assist. o Turn and reposition every 2 hours o Other: - peg shoe right, off-loader left Medications-Please add to medication list. o P.O. Antibiotics Wound Treatment Wound #7 - Foot Wound Laterality: Plantar, Left, Midline Cleanser: Dakin 16 (oz) 0.25 1 x Per Day/30 Days Discharge Instructions: Use as directed. Primary Dressing: Gauze 1 x Per Day/30 Days Discharge Instructions: As directed: moistened with Dakins Solution Secured With: Kerlix Roll Sterile or Non-Sterile 6-ply 4.5x4 (yd/yd) 1 x Per Day/30 Days Discharge Instructions: Apply Kerlix as directed Wound #9 - Calcaneus Wound Laterality: Right Cleanser: Soap and Water 1 x Per Day/30 Days Discharge Instructions: Gently cleanse wound with antibacterial soap, rinse and pat dry prior to dressing wounds Primary Dressing: Hydrofera Blue Ready Transfer Foam,  2.5x2.5 (in/in) 1 x  Per Day/30 Days Discharge Instructions: Apply Hydrofera Blue Ready to wound bed as directed Secondary Dressing: Bordered Gauze Sterile-HBD 4x4 (in/in) 1 x Per Day/30 Days Discharge Instructions: Cover wound with Bordered Guaze Sterile as directed Secured With: coverlet 1 x Per Day/30 Days Electronic Signature(s) Signed: 10/27/2021 12:20:20 PM By: Kalman Shan DO Entered By: Kalman Shan on 10/27/2021 12:19:28 [name] ([id number]) -------------------------------------------------------------------------------- Problem List Details Patient Name: Heather Dear A. Date of Service: 10/27/2021 10:30 AM Medical Record Number: [id number] Patient Account Number: [id number] Date of Birth/Sex: May 21, 1976 (46 y.o. 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 03/19/2021 No Yes severity L97.512 Non-pressure chronic ulcer of other part of right foot with fat layer 09/15/2021 No Yes exposed L97.812 Non-pressure chronic ulcer of other part of right lower leg with fat layer 09/15/2021 No Yes exposed E11.621 Type 2 diabetes mellitus with foot ulcer 03/19/2021 No Yes E11.42 Type 2 diabetes mellitus with diabetic polyneuropathy 03/19/2021 No Yes Inactive Problems ICD-10 Code Description Active Date Inactive Date S81.801A Unspecified open wound, right lower leg, initial encounter 06/30/2021 06/30/2021 Resolved Problems Electronic Signature(s) Signed: 10/27/2021 12:20:20 PM By: Kalman Shan DO Entered By: Kalman Shan on 10/27/2021 12:11:52 Dirr, Raphaella A. ([id number]) -------------------------------------------------------------------------------- Progress Note Details Patient Name:  Heather Dear A. Date of Service: 10/27/2021 10:30 AM Medical Record Number: [id number] Patient Account Number: [id number] Date of Birth/Sex: 10-Sep-1976 (46 y.o. F) Treating RN: Levora Dredge Primary Care Provider: Raelene Bott Other Clinician: Referring Provider: Raelene Bott Treating Provider/Extender: Yaakov Guthrie in Treatment: 31 Subjective Chief Complaint Information obtained from Patient 03/19/2021; patient referred by Dr. Luana Shu who has been looking after her left foot for quite a period of time for review of a nonhealing area in the left midfoot History of Present Illness (HPI) 01/18/18-She is here for initial evaluation of the left great toe ulcer. She is a poor historian in regards to timeframe in detail. She states approximately 4 weeks ago she lacerated her toe on something in the house. She followed up with her primary care who placed her on Bactrim and ultimately a second dose of Bactrim prior to coming to wound clinic. She states she has been treating the toe with peroxide, Betadine and a Band-Aid. She did not check her blood sugar this morning but checked it yesterday morning it was 327; she is unaware of a recent A1c and there are no current records. She saw Dr. she would've orthopedics last week for an old injury to the left ankle, she states he did not see her toe, nor did she bring it to his attention. She smokes approximately 1 pack cigarettes a day. Her social situation is concerning, she arrives this morning with her mother who appears extremely intoxicated/under the influence; her mother was asked to leave the room and be monitored by the patient's grandmother. The patient's aunt then accompanied the patient and the room throughout the rest of the appointment. We had a lengthy discussion regarding the deleterious effects of uncontrolled hyperglycemia and smoking as it relates to wound healing and overall health. She was strongly encouraged to decrease her  smoking and get her diabetes under better control. She states she is currently on a diet and has cut down her [facility] consumption. The left toe  is erythematous, macerated and slightly edematous with malodor present. The edema in her left foot is below her baseline, there is no erythema streaking. We will treat her with Santyl, doxycycline; we have ordered and xray, culture and provided a Peg assist surgical shoe and cultured the wound. 01/25/18-She is here in follow-up evaluation for a left great toe ulcer and presents with an abscess to her suprapubic area. She states her blood sugars remain elevated, feeling "sick" and if levels are below 250, but she is trying. She has made no attempt to decrease her smoking stating that we "can't take away her food in her cigarettes". She has been compliant with offloading using the PEG assist you. She is using Santyl daily. the culture obtained last week grew staph aureus and Enterococcus faecalis; continues on the doxycycline and Augmentin was added on Monday. The suprapubic area has erythema, no femoral variation, purple discoloration, minimal induration, was accessed with a cotton tip applicator with sanguinopurulent drainage, this was cultured, I suspect the current antibiotic treatment will cover and we will not add anything to her current treatment plan. She was advised to go to urgent care or ER with any change in redness, induration or fever. 02/01/18-She is here in follow-up evaluation for left great toe ulcers and a new abdominal abscess from last week. She was able to use packing until earlier this week, where she "forgot it was there". She states she was feeling ill with GI symptoms last week and was not taking her antibiotic. She states her glucose levels have been predominantly less than 200, with occasional levels between 200-250. She thinks this was contributing to her GI symptoms as they have resolved without intervention. There continues to be  significant laceration to left toe, otherwise it clinically looks stable/improved. There is now less superficial opening to the lateral aspect of the great toe that was residual blister. We will transition to [facility] to all wounds, she will continue her Augmentin. If there is no change or deterioration next week for reculture. 02/08/18-She is here in follow-up evaluation for left great toe ulcer and abdominal ulcer. There is an improvement in both wounds. She has been wrapping her left toe with coban, not by our direction, which has created an area of discoloration to the medial aspect; she has been advised to NOT use coban secondary to her neuropathy. She states her glucose levels have been high over this last week ranging from 200-350, she continues to smoke. She admits to being less compliant with her offloading shoe. We will continue with same treatment plan and she will follow-up next week. 02/15/18-She is here in follow-up evaluation for left great toe ulcer and abdominal ulcer. The abdominal ulcer is epithelialized. The left great toe ulcer is improved and all injury from last week using the Coban wrap is resolved, the lateral ulcer is healed. She admits to noncompliance with wearing offloading shoe and admits to glucose levels being greater than 300 most of the week. She continues to smoke and expresses no desire to quit. There is one area medially that probes deeper than it has historically, erythema to the toe and dorsal foot has consistently waxed and waned. There is no overt signs of cellulitis or infection but we will culture the wound for any occult infection given the new area of depth and erythema. We will hold off on sensitivities for initiation of antibiotic therapy. 02/22/18-She is here in follow up evaluation for left great toe ulcer. There is overall significant improvement  in both wound appearance, erythema and edema with changes made last week. She was not initiated on  antibiotic therapy. Culture obtained last week showed oxacillin sensitive staph aureus, sensitive to clindamycin. Clindamycin has been called into the pharmacy but she has been instructed to hold off on initiation secondary to overall clinical improvement and her history of antibiotic intolerance. She has been instructed to contact the clinic with any noted changes/deterioration and the wound, erythema, edema and/or pain. She will follow-up next week. She continues to smoke and her glucose levels remain elevated >250; she admits to compliance with offloading shoe 03/01/18 on evaluation today patient appears to be doing fairly well in regard to her left first toe ulcer. She has been tolerating the dressing changes with the [facility] Dressing without complication and overall this has definitely showed signs of improvement according to records as well is what the patient tells me today. I'm very pleased in that regard. She is having no pain today 03/08/18 She is here for follow up evaluation of a left great toe ulcer. She remains non-compliant with glucose control and smoking cessation; glucose levels consistently >200. She states that she got new shoe inserts/peg assist. She admits to compliance with offloading. Since my last evaluation there is significant improvement. We will switch to prisma at this time and she will follow up next week. She is noted to be tachycardic at this appointment, heart rate 120s; she has a history of heart rate 70-130 according to our records. She admits to extreme agitation r/t personal issues; she was advised to monitor her heartrate and contact her physician if it does not return to a more normal range (<100). She takes cardizem twice daily. 03/15/18-She is here in follow-up evaluation for left great toe ulcer. She remains noncompliant with glucose control and smoking cessation. She admits to compliance with wearing offloading shoe. The ulcer is improved/stable and we  will continue with the same treatment plan and she will follow-up next week 03/22/18-She is here for evaluation for left great toe ulcer. There continues to be significant improvement despite recurrent hyperglycemia (over 500 yesterday) and she continues to smoke. She has been compliant with offloading and we will continue with same treatment plan and she will follow-up next week. Batson, Myracle A. ([id number]) 03/29/18-She is here for evaluation for left great toe ulcer. Despite continuing to smoke and uncontrolled diabetes she continues to improve. She is compliant with offloading shoe. We will continue with the same treatment plan and she will follow-up next week 04/05/18- She is here in follow up evaluation for a left great toe ulcer; she presents with small pustule to left fifth toe (resembles ant bite). She admits to compliance with wearing offloading shoe; continues to smoke or have uncontrolled blood glucose control. There is more callus than usual with evidence of bleeding; she denies known trauma. 04/12/18-She is here for evaluation of left great toe ulcer. Despite noncompliance with glycemic control and smoking she continues to make improvement. She continues to wear offloading shoe. The pustule, that was identified last week, to the left fifth toe is resolved. She will follow-up in 2 weeks 05/03/18-she is seen in follow-up evaluation for a left great toe ulcer. She is compliant with offloading, otherwise noncompliant with glycemic control and smoking. She has plateaued and there is minimal improvement noted. We will transition to [facility], replaced the insert to her surgical shoe and she will follow-up in one week 05/10/18- She is here in follow up evaluation for a  left great toe ulcer. It appears stable despite measurement change. We will continue with same treatment plan and follow up next week. 05/24/18-She is seen in follow-up evaluation for a left great toe ulcer. She remains  compliant with offloading, has made significant improvement in her diet, decreasing the amount of sugar/soda. She said her recent A1c was 10.9 which is lower than. She did see a diabetic nutritionist/educator yesterday. She continues to smoke. We will continue with the same treatment plan and she'll follow-up next week. 05/31/18- She is seen in follow-up evaluation for left great toe ulcer. She continues to remain compliant with offloading, continues to make improvement in her diet, increasing her water and decreasing the amount of sugar/soda. She does continue to smoke with no desire to quit. We will apply Prisma to the depth and Hydrofera Blue over. We have not received insurance authorization for oasis. She will follow up next week. 06/07/18-She is seen in follow-up evaluation for left great toe ulcer. It has stalled according to today's measurements although base appears stable. She says she saw a diabetic educator yesterday; her average blood sugars are less than 300 which is an improvement for her. She continues to smoke and states "that's my next step" She continues with water over soda. We will order for xray, culture and reinstate ace wrap compression prior to placing apligraf for next week. She is voicing no complaints or concerns. Her dressing will change to iodoflex over the next week in preparation for apligraf. 06/14/18-She is seen in follow-up evaluation for left great toe ulcer. Plain film x-ray performed last week was negative for osteomyelitis. Wound culture obtained last week grew strep B and OSSA; she is initiated on keflex and cefdinir today; there is erythema to the toe which could be from ace wrap compression, she has a history of wrapping too tight and has has been encouraged to maintain ace wraps that we place today. We will hold off on application of apligraf today, will apply next week after antibiotic therapy has been initiated. She admits today that she has resumed taking a  shower with her foot/toe submerged in water, she has been reminded to keep foot/toe out of the bath water. She will be seen in follow up next week 06/21/18-she is seen in follow-up evaluation for left great toe ulcer. She is tolerating antibiotic therapy with no GI disturbance. The wound is stable. Apligraf was applied today. She has been decreasing her smoking, only had 4 cigarettes yesterday and 1 today. She continues being more compliant in diabetic diet. She will follow-up next week for evaluation of site, if stable will remove at 2 weeks. 06/28/18- She is here in follow up evalution. Apligraf was placed last week, she states the dressing fell off on Tuesday and she was dressing with hydrofera blue. She is healed and will be discharged from the clinic today. She has been instructed to continue with smoking cessation, continue monitoring glucose levels, offloading for an additional 4 weeks and continue with hydrofera blue for additional two weeks for any possible microscopic opening. Readmission: 08/07/18 on evaluation today patient presents for reevaluation concerning the ulcer of her right great toe. She was previously discharged on 06/28/18 healed. Nonetheless she states that this began to show signs of drainage she subsequently went to her primary care provider. Subsequently an x-ray was performed on 08/01/18 which was negative. The patient was also placed on antibiotics at that time. Fortunately they should have been effective for the infection. Nonetheless she's been experiencing  some improvement but still has a lot of drainage coming from the wound itself. 08/14/18 on evaluation today patient's wound actually does show signs of improvement in regard to the erythema at this point. She has completed the antibiotics. With that being said we did discuss the possibility of placing her in a total contact cast as of today although I think that I may want to give this just a little bit more time to  ensure nothing recurrence as far as her infection is concerned. I do not want to put in the cast and risk infection at that time if things are not completely resolved. With that being said she is gonna require some debridement today. 08/21/18 on evaluation today patient actually appears to be doing okay in regard to her toe ulcer. She's been tolerating the dressing changes without complication. With that being said it does appear that she is ready and in fact I think it's appropriate for [place] to go ahead and initiate the total contact cast today. Nonetheless she will require some sharp debridement to prepare the wound for application. Overall I feel like things have been progressing well but we do need to do something to get this to close more readily. 08/24/18 patient seen today for reevaluation after having had the total contact cast applied on Tuesday. She seems to have done very well the wound appears to be doing great and overall I'm pleased with the progress that she's made. There were no abnormal areas of rubbing from the cast on her lower extremity. 08/30/18 on evaluation today patient actually appears to be completely healed in regard to her plantar toe ulcer. She tells me at this point she's been having a lot of issues with the cast. She almost fell a couple of times the state shall the step of her dog a couple times as well. This is been a very frustrating process for her other nonetheless she has completely healed the wound which is excellent news. Overall there does not appear to be the evidence of infection at this time which is great news. 09/11/18 evaluation today patient presents for follow-up concerning her great toe ulcer on the left which has unfortunately reopened since I last saw her which was only a couple of weeks ago. Unfortunately she was not able to get in to get the shoe and potentially the AFO that's gonna be necessary due to her left foot drop. She continues with offloading  shoe but this is not enough to prevent her from reopening it appears. When we last had her in the total contact cast she did well from a healing standpoint but unfortunately the wound reopened as soon as she came out of the cast within just a couple of weeks. 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 This is also part of the reason why she falls on a regular basis. I just do not believe that is gonna be safe for [place] to reinitiate the total contact cast as last time we had this on she fell 3 times one week which is definitely not normal for her. 09/18/18 upon evaluation today the patient actually appears to be doing about the same in regard to her toe ulcer. She did not contact [employer] as I asked her to even though  I had given her the prescription. In fact she actually states that she has no idea where the prescription is. She did apparently call [employer] and they told her that all she needed to do was bring the prescription in order to be able to be seen and work on getting the AFO for her left foot. With all that being said she still does not have an appointment and I'm not sure were things stand that regard. I will give her a new prescription today in order to contact them to get this set up. [name], [name] ([id number]) 09/25/18 on evaluation today patient actually appears to be doing about the same in regard to her toes ulcer. She does have a small areas which seems to have a lot of callous buildup around the edge of the wound which is going to need sharp debridement today. She still is waiting to be scheduled for evaluation with [employer] for possibility of an AFO. She states there supposed to call her tomorrow to get this set up. Unfortunately it does appear that her foot specifically the toe area is  showing signs of erythema. There does not appear to be any systemic infection which is in these good news. 10/02/18 on evaluation today patient actually appears to be doing about the same in regard to her toe ulcer. This really has not done too well although it's not significantly larger it's also not significantly smaller. She has been tolerating the dressing changes without complication. She actually has her appointment with [facility] and [place] tomorrow to hopefully be measured for obtaining and AFO splint. I think this would be helpful preventing this from reoccurring. We had contemplated starting the cast this week although to be honest I am reluctant to do that as she's been having nausea, vomiting, and seizure activity over the past three days. She has a history of seizures and have been told is nothing that can be done for these. With that being said I do believe that along with the seizures have the nausea vomiting which upon further questioning doesn't seem to be the normal for her and makes me concerned for the possibility of infection or something else going on. I discussed this with the patient and her mother during the office visit today. I do not feel the wound is effective but maybe something else. The responses this was "this just happens to her at times and we don't know why". They did not seem to be interested in going to the hospital to have this checked out further. 10/09/18 on evaluation today patient presents for follow-up concerning her ongoing toe ulcer. She has been tolerating the dressing changes without complication. Fortunately there does not appear to be any evidence of infection which is great news however I do think that the patient would benefit from going ahead for with the total contact cast. She's actually in a wheelchair today she tells me that she will use her walker if we initiate the cast. I was very specific about the fact that if we were gonna do the cast I  wanted to make sure that she was using the walker in order to prevent any falls. She tells me she does not have stairs that she has to traverse on a regular basis at her home. She has not had any seizures since last week again that something that happens to her often she tells me she did talk to [employer] and they said that it may take up to three weeks to  get the brace approved for her. Hopefully that will not take that long but nonetheless in the meantime I do think the cast could be of benefit. 10/12/18 on evaluation today patient appears to be doing rather well in regard to her toe ulcer. It's just been a few days and already this is significantly improved both as far as overall appearance and size. Fortunately there's no sign of infection. She is here for her first obligatory cast change. 10/19/18 Seen today for follow up and management of left great toe ulcer. Wound continues to show improvement. Noted small open area with seroussang drainage with palpation. Denies any increased pain or recent fevers during visit. She will continue calcium alginate with offloading shoe. Denies any questions or concerns during visit. 10/26/18 on evaluation today patient appears to be doing about the same as when I last saw her in regard to her wound bed. Fortunately there does not appear to be any signs of infection. Unfortunately she continues to have a breakdown in regard to the toe region any time that she is not in the cast. It takes almost no time at all for this to happen. Nonetheless she still has not heard anything from the brace being made by [employer] as to when exactly this will be available to her. Fortunately there is no signs of infection at this time. 10/30/18 on evaluation today patient presents for application of the total contact cast as we just received him this morning. Fortunately we are gonna be able to apply this to her today which is great news. She continues to have no significant pain which is  good news. Overall I do feel like things have been improving while she was the cast is when she doesn't have a cast that things get worse. She still has not really heard anything from [place] regarding her brace. 11/02/18 upon evaluation today patient's wound already appears to be doing significantly better which is good news. Fortunately there does not appear to be any signs of infection also good news. Overall I do think the total contact cast as before is helping to heal this area unfortunately it's just not gonna likely keep the area closed and healed without her getting her brace at least. Again the foot drop is a significant issue for her. 11/09/18 on evaluation today patient appears to be doing excellent in regard to her toe ulcer which in fact is completely healed. Fortunately we finally got the situation squared away with the paperwork which was needed to proceed with getting her brace approved by Medicaid. I have filled that out unfortunately that information has been sent to the orthopedic office that I worked at 2 1/2 years ago and not tired Current wound care measures. Fortunately she seems to be doing very well at this time. 11/23/18 on evaluation today patient appears to be doing More Poorly Compared to Last Time I Saw Her. At [facility] She Had Completely Healed. Currently she is continuing to have issues with reopening. She states that she just found out that the brace was approved through Medicaid now she just has to go get measured in order to have this fitted for her and then made. Subsequently she does not have an appointment for this yet that is going to complicate things we obviously cannot put her back in the cast if we do not have everything measured because they're not gonna be able to measure her foot while she is in the cast. Unfortunately the other thing that I  found out today as well is that she was in the hospital over the weekend due to having a heroin overdose. Obviously  this is unfortunate and does have me somewhat worried as well. 11/30/18 on evaluation today patient's toe ulcer actually appears to be doing fairly well. The good news is she will be getting her brace in the shoes next week on Wednesday. Hopefully we will be able to get this to heal without having to go back in the cast however she may need the cast in order to get the wound completely heal and then go from there. Fortunately there's no signs of infection at this time. 12/07/18 on evaluation today patient fortunately did receive her brace and she states she could tell this definitely makes her walk better. With that being said she's been having issues with her toe where she noticed yesterday there was a lot of tissue that was loosing off this appears to be much larger than what it was previous. She also states that her leg has been read putting much across the top of her foot just about the ankle although this seems to be receiving somewhat. The total area is still red and appears to be someone infected as best I can tell. She is previously taken Bactrim and that may be a good option for her today as well. We are gonna see what I wound culture shows as well and I think that this is definitely appropriate. With that being said outside of the culture I still need to initiate something in the interim and that's what I'm gonna go ahead and select Bactrim is a good option for her. 12/14/18 on evaluation today patient appears to be doing better in regard to her left great toe ulcer as compared to last week's evaluation. There's still some erythema although this is significantly improved which is excellent news. Overall I do believe that she is making good progress is still gonna take some time before she is where I would like her to be from the standpoint of being able to place her back into the total contact cast. Hopefully we will be where we need to be by next week. 12/21/18 on evaluation today patient  actually appears to be doing poorly in regard to her toe ulcer. She's been tolerating the dressing changes without complication. Fortunately there's no signs of systemic infection although she does have a lot of drainage from the toe ulcer and this does [name], [name] ([id number]) seem to be causing some issues at this point. She does have erythema on the distal portion of her toe that appears to be likely cellulitis. 12/28/18 on evaluation today patient actually appears to be doing a little better in my pinion in regard to her toe ulcer. With that being said she still does have some evidence of infection at this time and for her culture she had both E. coli as well as enterococcus as organisms noted on evaluation. For that reason I think that though the Keflex likely has treated the E. coli rather well this has really done nothing for the enterococcus. We are going to have to initiate treatment for this specifically. 01/04/19 on evaluation today patient's toe actually appears to be doing better from the standpoint of infection. She currently would like to see about putting the cash back on I think that this is appropriate as long as she takes care of it and keeps it from getting wet. She is gonna have some drainage we can definitely  pass this up with Drawtex and alginate to try to prevent as much drainage as possible from causing the problems. With that being said I do want to at least try her with the cast between now and Tuesday. If there any issues we can't continue to use it then I will discontinue the use of the cast at that point. 01/08/19 on evaluation today patient actually appears to be doing very well as far as her foot ulcer specifically the great toe on the left is concerned. She did have an area of rubbing on the medial aspect of her left ankle which again is from the cast. Fortunately there's no signs of infection at this point in this appears to be a very slight skin breakdown. The  patient tells me she felt it rubbing but didn't think it was that bad. Fortunately there is no signs of active infection at this time which is good news. No fevers, chills, nausea, or vomiting noted at this time. 01/15/19 on evaluation today patient actually appears to be doing well in regard to her toe ulcer. Again as previous she seems to do well and she has the cast on which indicates to me that during the time she doesn't have a cast on she's putting way too much pressure on this region. Obviously I think that's gonna be an issue as with the current national emergency concerning the Covid-19 Virus it has been recommended that we discontinue the use of total contact casting by the chief medical officer of our company, Dr. Simona Huh. The reasoning is that if a patient becomes sick and cannot come into have the cast removed they could not just leave this on for an additional two weeks. Obviously the hospitals also do not want to receive patient's who are sick into the emergency department to potentially contaminate the region and spread the Covid-19 Virus among other sick individuals within the hospital system. Therefore at this point we are suspending the use of total contact cast until the current emergency subsides. This was all discussed with the patient today as well. 01/22/19 on evaluation today patient's wound on her left great toe appears to be doing slightly worse than previously noted last week. She tells me that she has been on this quite a bit in fact she tells me she's been awake for 38 straight hours. This is due to the fact that she's having to care for grandparents because nobody else will. She has been taking care of them for five the last seven days since I've seen her they both have dementia his is from a stroke and her grandmother's was progressive. Nonetheless she states even her mom who knows her condition and situation has only help two of those days to take care of them she's been  taking care of the rest. Fortunately there does not appear to be any signs of active infection in regard to her toe at this point although obviously it doesn't look as good as it did previous. I think this is directly related to her not taking off the pressure and friction by way of taking things easy. Though I completely understand what's going on. 01/29/19 on evaluation today patient's tools are actually appears to be showing some signs of improvement today compared to last week's evaluation as far as not necessarily the overall size of the wound but the fact that she has some new skin growth in between the two ends of the wound opening. Overall I feel like she has done  well she states that she had a family member give her what sounds to be a CAM walker boot which has been helpful as well. 02/05/19 on evaluation today patient's wound bed actually appears to be doing significantly better in regard to her overall appearance of the size of the wound. With that being said she is still having an issue with offloading efficiently enough to get this to close. Apparently there is some signs of infection at this point as well unfortunately. Previously she's done well of Augmentin I really do not see anything that needs to be culture currently but there theme and cellulitis of the foot that I'm seeing I'm gonna go ahead and place her on an antibiotic today to try to help clear this up. 02/12/2019 on evaluation today patient actually appears to be doing poorly in regard to her overall wound status. She tells me she has been using her offloading shoe but actually comes in today wearing her tennis shoe with the AFO brace. Again as I previously discussed with her this is really not sufficient to allow the area to heal appropriately. Nonetheless she continues to be somewhat noncompliant and I do wonder based on what she has told my nurse in the past as to whether or not a good portion of this noncompliance may be  recreational drug and alcohol related. She has had a history of heroin overdose and this was fairly recently in the past couple of months that have been seeing her. Nonetheless overall I feel like her wound looks significantly worse today compared to what it was previous. She still has significant erythema despite the Augmentin I am not sure that this is an appropriate medication for her infection I am also concerned that the infection may have gone down into her bone. 02/19/19 on evaluation today patient actually appears to be doing about the same in regard to her toe ulcer. Unfortunately she continues to show signs of bone exposure and infection at this point. There does not appear to be any evidence of worsening of the infection but I'm also not really sure that it's getting significantly better. She is on the Augmentin which should be sufficient for the Staphylococcus aureus infection that she has at this point. With that being said she may need IV antibiotics to more appropriately treat this. We did have a discussion today about hyperbaric option therapy. 02/28/19 on evaluation today patient actually appears to be doing much worse in regard to the wound on her left great toe as compared to even my previous evaluation last week. Unfortunately this seems to be training in a pretty poor direction. Her toe was actually now starting to angle laterally and I can actually see the entire joint area of the proximal portion of the digit where is the distal portion of the digit again is no longer even in contact with the joint line. Unfortunately there's a lot more necrotic tissue around the edge and the toe appears to be showing signs of becoming gangrenous in my pinion. I'm very concerned about were things stand at this point. She did see infectious disease and they are planning to send in a prescription for Sivextro for her and apparently this has been approved. With that being said I don't think she should  avoid taking this but at the same time I'm not sure that it's gonna be sufficient to save her toe at this point. She tells me that she still having to care for grandparents which I think is  putting quite a bit of strain on her foot and specifically the total area and has caused this to break down even to a greater degree than would've otherwise been expected. 03/05/19 on evaluation today patient actually appears to be doing quite well in regard to her toe all things considering. She still has bone exposed but there appears to be much less your thing on overall the appearance of the wound and the toe itself is dramatically improved. She still does have some issues currently obviously with infection she did see vascular as well and there concerned that her blood flow to the toad. For that reason they are setting up for an angiogram next week. 03/14/19 on evaluation today patient appears to be doing very poor in regard to her toe and specifically in regard to the ulceration and the fact that she's starting to notice the toe was leaning even more towards the lateral aspect and the complete joint is visible on the proximal aspect of the joint. Nonetheless she's also noted a significant odor and the tip of the toe is turning more dark and necrotic appearing. Overall I think she is getting worse not better as far as this is concerned. For that reason I am recommending at this point that she likely needs to be seen for likely amputation. [name], [name] ([id number]) READMISSION 03/19/2021 This is a patient that we cared for in this clinic for a prolonged period of time in 2019 and 2020 with a left foot and left first toe wound. 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 Luana Shu. In December 2021 she had an ulcer on her right great toe as well as the fourth and fifth toes. She underwent a  partial ray amputation of the right fourth and fifth toes. She also had an angiogram at that time and underwent angioplasty of the right anterior tibial artery. In any case she claims that the wound on the right foot is closed I did not look at this today which was probably an oversight although I think that should be done next week. After her surgery she developed a dehiscence but I do not see any follow-up of this. According to Dr. Deborra Medina last review that she was out of the area being cared for by another physician but recently came back to his attention. 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 A culture of this area showed E. coli apparently before she came back to see Dr. Luana Shu she was supposed to be receiving antibiotics but she did not really take them. Nor is she offloading this area at all. Finally her last hemoglobin A1c listed in epic was in March 2022 at 14.1 she says things are a lot better since then although I am not sure. 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 She had this wound at the time. During this admission she had E. coli sepsis a TEE was negative. 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 She is still a heavy cigarette smoker. She has a history of bipolar disorder seizure disorder ABI in our clinic was 1.05 6/1; left midfoot  in the setting of a TMA done previously. Round circular wound with a "knuckle" of protruding tissue. The problem is that the knuckle was not attached to any of the surrounding granulation and this probed proximally widely I removed a large portion of this tissue. This wound goes with considerable  undermining laterally. I do not feel any bone there was no purulence but this is a deep wound. 6/8; in spite of the debridement I did last week. She arrives with a wound looking exactly the same. A protruding "knuckle" of tissue nonadherent to most of the surrounding tissue. There is considerable depth around this from 6-12 o'clock at 2.7 cm and undermining of 1 cm. This does not look overtly infected and the x-ray I did last week was negative for any osseous abnormalities. We have been using silver collagen 6/15; deep tissue culture I did last week showed moderate staph aureus and moderate Pseudomonas. This will definitely require prolonged antibiotic therapy. The pathology on the protuberant area was negative for malignancy fungus etc. 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 We have been using silver collagen. I am going to be prescribing Levaquin for 2 weeks. Her CT scan of the foot is down for 7/5 6/22; CT scan of the foot on 7 5. She says she has hardware in the left leg from her previous fracture. She is on the Levaquin for the deep tissue culture I did that showed methicillin sensitive staph aureus and Pseudomonas. I gave her a 2-week supply and she will have another week. She arrives in clinic today with the same protuberant tissue however this is nonadherent to the tissue surrounding it. I am really at a loss to explain this unless there is underlying deep tissue infection 6/29; patient presents for 1 week follow-up. She has been using collagen to the wound bed. She reports taking her antibiotics as prescribed.She has no complaints or issues today. She denies signs of infection. 7/6; patient presents for one week followup. She has been using collagen to the wound bed. She states she is taking Levaquin however at times she is not able to keep it down. She denies signs of infection. 7/13; patient presents for 1 week follow-up. She  has been using silver alginate to the wound bed. She still has nausea when taking her antibiotics. She denies signs of infection. 7/20; patient presents for 1 week follow-up. She has been using silver alginate with gentamicin cream to the wound bed. She denies any issues and has no complaints today. She denies signs of infection. 7/27; patient presents for 1 week follow-up. She continues to use silver alginate with gentamicin cream to the wound bed. She reports starting her antibiotics. She has no issues or complaints. Overall she reports stability to the wound. 8/3; patient presents for 1 week follow-up. She has been using silver alginate with gentamicin cream to the wound bed. She reports completing all antibiotics. She has no issues or complaints today. She denies signs of infection. 8/17; patient presents for 2-week follow-up. He is to use silver alginate to the wound bed. She has no issues or complaints today. She denies signs of infection. She reports her pain has improved in her foot since last clinic visit 8/24; patient presents for 1 week follow-up. She continues to use silver alginate to the wound bed. She has no issues or complaints. She denies signs of infection. Pain is stable. 9/7; patient presents for follow-up. She missed her last week appointment  due to feeling ill. She continues to use silver alginate. She has a new wound to the right lower extremity that is covered in eschar. She states It occurred over the past week and has no idea how it started. She currently denies signs of infection. 9/14; patient presents for follow-up. To the left foot wound she has been using gentamicin cream and silver alginate. To the right lower extremity wound she has been keeping this covered and has not obtain Santyl. 9/21; patient presents for follow-up. She reports using gentamicin cream and silver alginate to the left foot and Santyl to the right lower extremity wound. She has no issues or  complaints today. She denies signs of infection. 9/28; patient presents for follow-up. She reports a new wound to her right heel. She states this occurred a few days ago and is progressively gotten worse. She has been trying to clean the area with a Q-tip and Santyl. She reports stability in the other 2 wounds. She has been using gentamicin cream and silver alginate to the left foot and Santyl to the right lower extremity wound. 10/12; patient presents for follow-up. She reports improvement to the wound beds. She is seeing vein and vascular to discuss the potential of a left BKA. She states they are going to do an arteriogram. She continues to use silver alginate with dressing changes to her wounds. 11/2; patient presents for follow-up. She states she has not been doing dressing changes to the wound beds. She states she is not able to offload the areas. She reports chronic pain to her left foot wound. 11/9; patient presents for follow-up. She came in with only socks on. She states she forgot to put on shoes. It is unclear if she is doing any dressing changes. She currently denies systemic signs of infection. 11/16; patient presents for follow-up. She came again only with socks on. She states she does not wear shoes ever. It is unclear if she does dressing changes. She currently denies systemic signs of infection. 11/23; patient presents for follow-up. She wore her shoes today. It still unclear exactly what dressing she is using for each wound but she did states she obtained Dakin's solution and has been using this to the left foot wound. She currently denies signs of infection. [name], [name] ([id number]) 11/30; patient presents for follow-up. She has no issues or complaints today. She currently denies signs of infection. 12/7; patient presents for follow-up. She has no issues or complaints today. She has been using Hydrofera Blue to the right heel wound and Dakin solution to the left foot wound.  Her right anterior leg wound is healed. She currently denies signs of infection. 12/14; patient presents for follow-up. She has been using Hydrofera Blue to the right heel and Dakin's to the left foot wounds. She has no issues or complaints today. She denies signs of infection. 12/21; patient presents for follow-up. She reports using Hydrofera Blue to the right heel and Dakin's to the left foot wound. She denies signs of infection. 12/28; patient presents for follow-up. She continues to use Dakin's to the left foot wound and Hydrofera Blue to the right heel wound. She denies signs of infection. 1/4; patient presents for follow-up. She has no issues or complaints today. She denies signs of infection. Objective Constitutional Vitals Time Taken: 10:45 AM, Height: 69 in, Weight: 185 lbs, BMI: 27.3, Temperature: 98.3 F, Pulse: 77 bpm, Respiratory Rate: 18 breaths/min, Blood Pressure: 103/75 mmHg. General Notes: Left foot (Transmetatarsal amputation): Wound to  the plantar aspect that has granulation tissue with scant nonviable tissue and increased pockets of depth. There is circumferential callus. Right foot: To the heel there is an open wound with Granulation tissue , Nonviable tissue and Circumferential callus. No obvious signs of infection to any wound. Integumentary (Hair, Skin) Wound #7 status is Open. Original cause of wound was Gradually Appeared. The date acquired was: 08/07/2020. The wound has been in treatment 31 weeks. The wound is located on the [place]. The wound measures 4.6cm length x 2.1cm width x 0.4cm depth; 7.587cm^2 area and 3.035cm^3 volume. There is Fat Layer (Subcutaneous Tissue) exposed. There is no tunneling or undermining noted. There is a large amount of serosanguineous drainage noted. The wound margin is thickened. There is large (67-100%) pink, hyper - granulation within the wound bed. There is a small (1-33%) amount of necrotic tissue within the wound  bed including Adherent Slough. Wound #9 status is Open. Original cause of wound was Gradually Appeared. The date acquired was: 07/18/2021. The wound has been in treatment 14 weeks. The wound is located on the Right Calcaneus. The wound measures 0.4cm length x 0.4cm width x 0.3cm depth; 0.126cm^2 area and 0.038cm^3 volume. There is no tunneling noted, however, there is undermining starting at 9:00 and ending at 3:00 with a maximum distance of 0.3cm. There is a medium amount of serosanguineous drainage noted. The wound margin is well defined and not attached to the wound base. There is no granulation within the wound bed. There is a large (67-100%) amount of necrotic tissue within the wound bed including Eschar. 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 The right heel wound has a pressure injury to the surrounding wound bed. I debrided nonviable tissue. No signs of infection on exam. I recommended continuing Dakin's wet-to-dry dressings to the left foot wound and Hydrofera Blue to the right foot wound. Procedures Lawlor, Blevins ([id number]) Wound #7 Pre-procedure diagnosis of Wound #7 is a Diabetic Wound/Ulcer of the Lower Extremity located on the [place] .Severity of Tissue Pre Debridement is: Fat layer exposed. There was a Excisional Skin/Subcutaneous Tissue Debridement with a total area of 9.66 sq cm performed by Kalman Shan, MD. With the following instrument(s): Curette to remove Viable and Non-Viable tissue/material. Material removed includes Callus, Subcutaneous Tissue, and Slough after achieving pain control  using Lidocaine 4% Topical Solution. No specimens were taken. A time out was conducted at 11:21, prior to the start of the procedure. A Minimum amount of bleeding was controlled with Pressure. The procedure was tolerated well. Post Debridement Measurements: 4.6cm length x 2.1cm width x 0.4cm depth; 3.035cm^3 volume. Character of Wound/Ulcer Post Debridement is stable. Severity of Tissue Post Debridement is: Fat layer exposed. Post procedure Diagnosis Wound #7: Same as Pre-Procedure Wound #9 Pre-procedure diagnosis of Wound #9 is a Diabetic Wound/Ulcer of the Lower Extremity located on the Right Calcaneus .Severity of Tissue Pre Debridement is: Fat layer exposed. There was a Excisional Skin/Subcutaneous Tissue Debridement with a total area of 0.16 sq cm performed by Kalman Shan, MD. With the following instrument(s): Curette to remove Viable and Non-Viable tissue/material. Material removed includes Eschar, Callus, and Subcutaneous Tissue after achieving pain control  using Lidocaine 4% Topical Solution. No specimens were taken. A time out was conducted at 11:15, prior to the start of the procedure. A Minimum amount of bleeding was controlled with Pressure. The procedure was tolerated well. Post Debridement Measurements: 1cm length x 0.7cm width x 0.4cm depth; 0.22cm^3 volume. Character of Wound/Ulcer Post Debridement is stable. Severity of Tissue Post Debridement is: Fat layer exposed. Post procedure Diagnosis Wound #9: Same as Pre-Procedure Plan Follow-up Appointments: Return Appointment in 1 week. Bathing/ Shower/ Hygiene: Wash wounds with antibacterial soap and water. Off-Loading: Open toe surgical shoe with peg assist. Turn and reposition every 2 hours Other: - peg shoe right, off-loader left Medications-Please add to medication list.: P.O. Antibiotics WOUND #7: - Foot Wound Laterality: Plantar, Left, Midline Cleanser: Dakin 16 (oz) 0.25 1 x Per Day/30 Days Discharge Instructions:  Use as directed. Primary Dressing: Gauze 1 x Per Day/30 Days Discharge Instructions: As directed: moistened with Dakins Solution Secured With: Kerlix Roll Sterile or Non-Sterile 6-ply 4.5x4 (yd/yd) 1 x Per Day/30 Days Discharge Instructions: Apply Kerlix as directed WOUND #9: - Calcaneus Wound Laterality: Right Cleanser: Soap and Water 1 x Per Day/30 Days Discharge Instructions: Gently cleanse wound with antibacterial soap, rinse and pat dry prior to dressing wounds Primary Dressing: Hydrofera Blue Ready Transfer Foam, 2.5x2.5 (in/in) 1 x Per Day/30 Days Discharge Instructions: Apply Hydrofera Blue Ready to wound bed as directed Secondary Dressing: Bordered Gauze Sterile-HBD 4x4 (in/in) 1 x Per Day/30 Days Discharge Instructions: Cover wound with Bordered Guaze Sterile as directed Secured With: coverlet 1 x Per Day/30 Days 1. In office sharp debridement 2. Hydrofera Blue and Dakin's 3. Follow-up in 1 week Electronic Signature(s) Signed: 10/27/2021 12:20:20 PM By: Kalman Shan DO Entered By: Kalman Shan on 10/27/2021 12:18:44 [name] ([id number]) -------------------------------------------------------------------------------- [place] Details Patient Name: Heather Dear A. Date of Service: 10/27/2021 Medical Record Number: [id number] Patient Account Number: [id number] Date of Birth/Sex: 12/28/1975 (46 y.o. F) Treating RN: Levora Dredge Primary Care Provider: Raelene Bott Other Clinician: Referring Provider: Raelene Bott Treating Provider/Extender: Yaakov Guthrie in Treatment: 31 Diagnosis Coding ICD-10 Codes Code Description [phone] 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ity L97.512 Non-pressure chronic ulcer of other part of right foot with fat layer exposed L97.812 Non-pressure chronic ulcer of other part of right lower leg with fat layer exposed E11.621 Type 2 diabetes mellitus with foot ulcer E11.42 Type 2  diabetes mellitus with diabetic polyneuropathy Facility Procedures CPT4 Code: [id number] Description: 18841 - DEB SUBQ TISSUE 20 SQ CM/< Modifier: Quantity: 1 CPT4 Code: Description: ICD-10 Diagnosis Description 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 L97.512 Non-pressure chronic ulcer of other part of right foot with fat layer expo E11.621 Type 2 diabetes mellitus with foot  ulcer Modifier: severity sed Quantity: Physician Procedures CPT4 Code: 6606301 Description: 11042 - WC PHYS SUBQ TISS 20 SQ CM Modifier: Quantity: 1 CPT4 Code: Description: ICD-10 Diagnosis Description 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 L97.512 Non-pressure chronic ulcer of other part of right foot with fat layer expo E11.621 Type 2 diabetes mellitus with foot  ulcer Modifier: severity sed Quantity: Electronic Signature(s) Signed: 10/27/2021 12:20:20 PM By: Kalman Shan DO Entered By: Kalman Shan on 10/27/2021 12:19:05

## 2021-10-27 NOTE — Progress Notes (Signed)
[name], [name] ([id number]) Visit Report for 10/27/2021 Arrival Information Details Patient Name: [name], [name] [name] [name]. Date of Service: 10/27/2021 10:30 AM Medical Record Number: [id number] Patient Account Number: [id number] Date of Birth/Sex: 03/31/1976 (46 y.o. F) Treating RN: Levora Dredge Primary Care [name] [name]: [name] [name] Other Clinician: Referring [name] [name]: [name] [name] Treating [name] [name]/Extender: [name] [name] in Treatment: 2 Visit Information History Since Last Visit Added or deleted any medications: No Patient Arrived: Wheel Chair Any new allergies or adverse reactions: No Arrival Time: 10:39 Had [name] fall or experienced change in No Accompanied By: mom activities of daily living that may affect Transfer Assistance: EasyPivot Patient Lift risk of falls: Patient Identification Verified: Yes Hospitalized since last visit: No Secondary Verification Process Completed: Yes Has Dressing in Place as Prescribed: Yes Patient Requires Transmission-Based No Has Footwear/Offloading in Place as Prescribed: Yes Precautions: Left: Surgical Shoe with Pressure Relief Patient Has Alerts: Yes Insole Patient Alerts: Patient on Blood Right: Surgical Shoe with Pressure Relief Thinner Insole 32m apirin Pain Present Now: No Diabetic Type II Electronic Signature(s) Signed: 10/27/2021 4:31:39 PM By: GLevora DredgeEntered By: GLevora Dredgeon 10/27/2021 10:45:12 SLonzo Candy([id number] -------------------------------------------------------------------------------- Encounter Discharge Information Details Patient Name: [name] [name]. Date of Service: 10/27/2021 10:30 AM Medical Record Number: [id number] Account Number: [id number] of Birth/Sex: [birth year](46y.o. F) Treating RN: GLevora DredgePrimary Care Heather Stoermer: HRaelene BottOther Clinician: Referring [name] [name]: HRaelene BottTreating [name] [name]/Extender: HYaakov Guthriein Treatment:  321Encounter Discharge Information Items Post Procedure Vitals Discharge Condition: Stable Temperature (F): 98.3 Ambulatory Status: Wheelchair Pulse (bpm): 77 Discharge Destination: Home Respiratory Rate (breaths/min): 18 Transportation: Private Auto Blood Pressure (mmHg): 103/75 Accompanied By: mother Schedule Follow-up Appointment: Yes Clinical Summary of Care: Electronic Signature(s) Signed: 10/27/2021 12:41:04 PM By: GLevora DredgeEntered By: GLevora Dredgeon 10/27/2021 12:41:03 SLonzo Candy([id number] -------------------------------------------------------------------------------- Lower Extremity Assessment Details Patient Name: [name] [name]. Date of Service: 10/27/2021 10:30 AM Medical Record Number: [id number] Account Number: [id number] of Birth/Sex: 920-Jan-1977(46y.o. F) Treating RN: GLevora DredgePrimary Care [name] [name]: HRaelene BottOther Clinician: Referring Marvie Brevik: HRaelene BottTreating [name] [name]/Extender: HYaakov Guthriein Treatment: 31 Vascular Assessment Pulses: Dorsalis Pedis Palpable: [Left:Yes] [Right:Yes] Electronic Signature(s) Signed: 10/27/2021 4:31:39 PM By: GLevora DredgeEntered By: GLevora Dredgeon 10/27/2021 11:01:07 SLonzo Candy([id number] -------------------------------------------------------------------------------- Multi Wound Chart Details Patient Name: [name] [name]. Date of Service: 10/27/2021 10:30 AM Medical Record Number: [id number] Account Number: [id number] of Birth/Sex: [birth year](46y.o. F) Treating RN: GLevora DredgePrimary Care Jerran Tappan: HRaelene BottOther Clinician: Referring [name] [name]: HRaelene BottTreating Landen [name]/Extender: HYaakov Guthriein Treatment: 31 Vital Signs Height(in): 641Pulse(bpm): 736Weight(lbs): 185 Blood Pressure(mmHg): 103/75 Body Mass Index(BMI): 27 Temperature(F): 98.3 Respiratory Rate(breaths/min): 18 Photos: [N/[name]:N/[name]] Wound  Location: Left, Midline, Plantar Foot Right Calcaneus N/[name] Wounding Event: Gradually Appeared Gradually Appeared N/[name] Primary Etiology: Diabetic Wound/Ulcer of the Lower Diabetic Wound/Ulcer of the Lower N/[name] Extremity Extremity Comorbid History: Chronic sinus problems/congestion, Chronic sinus problems/congestion, N/[name] Middle ear problems, Anemia, Middle ear problems, Anemia, Chronic Obstructive Pulmonary Chronic Obstructive Pulmonary Disease (COPD), Congestive Heart Disease (COPD), Congestive Heart Failure, Type II Diabetes, End Stage Failure, Type II Diabetes, End Stage Renal Disease, History of pressure Renal Disease, History of pressure wounds, Neuropathy wounds, Neuropathy Date Acquired: 08/07/2020 07/18/2021 N/[name] Weeks of Treatment: 31 14 N/[name] Wound Status: Open Open N/[name] Measurements L x W x D (cm) 4.6x2.1x0.4 0.4x0.4x0.3 N/[name] Area (cm) : 7.587 0.126 N/[name] Volume (cm) : 3.035  0.038 N/[name] % Reduction in Area: -184.20% 61.80% N/[name] % Reduction in Volume: -127.30% 83.50% N/[name] Starting Position 1 (o'clock): 9 Ending Position 1 (o'clock): 3 Maximum Distance 1 (cm): 0.3 Undermining: No Yes N/[name] Classification: Grade 2 Grade 1 N/[name] Exudate Amount: Large Medium N/[name] Exudate Type: Serosanguineous Serosanguineous N/[name] Exudate Color: red, brown red, brown N/[name] Wound Margin: Thickened Well defined, not attached N/[name] Granulation Amount: Large (67-100%) None Present (0%) N/[name] Granulation Quality: Pink, Hyper-granulation N/[name] N/[name] Necrotic Amount: Small (1-33%) Large (67-100%) N/[name] Necrotic Tissue: Adherent Slough Eschar N/[name] Exposed Structures: Fat Layer (Subcutaneous Tissue): Fascia: No N/[name] Yes Fat Layer (Subcutaneous Tissue): Fascia: No No Tendon: No Tendon: No Muscle: No Muscle: No Joint: No Joint: No Bone: No Bone: No Epithelialization: None None N/[name] Treatment Notes Heather, [name] ([id number]) Electronic Signature(s) Signed: 10/27/2021 4:31:39 PM By: Levora Dredge Entered By: Levora Dredge  on 10/27/2021 11:15:01 [name] ([id number]) -------------------------------------------------------------------------------- [place] Details Patient Name: [name] Dear [name]. Date of Service: 10/27/2021 10:30 AM Medical Record Number: [id number] Patient Account Number: [id number] Date of Birth/Sex: 07-Mar-1976 (46 y.o. F) Treating RN: Levora Dredge Primary Care Azaryah Oleksy: [name] [name] Other Clinician: Referring Mahari Strahm: [name] [name] Treating Patrycja Mumpower/Extender: [name] [name] in Treatment: 31 Active Inactive Necrotic Tissue Nursing Diagnoses: Impaired tissue integrity related to necrotic/devitalized tissue Knowledge deficit related to management of necrotic/devitalized tissue Goals: Necrotic/devitalized tissue will be minimized in the wound bed Date Initiated: 04/07/2021 Target Resolution Date: 10/08/2021 Goal Status: Active Patient/caregiver will verbalize understanding of reason and process for debridement of necrotic tissue Date Initiated: 04/07/2021 Date Inactivated: 09/22/2021 Target Resolution Date: 09/08/2021 Goal Status: Met Interventions: Assess patient pain level pre-, during and post procedure and prior to discharge Provide education on necrotic tissue and debridement process Treatment Activities: Excisional debridement : 04/07/2021 Notes: Electronic Signature(s) Signed: 10/27/2021 4:31:39 PM By: Levora Dredge Entered By: Levora Dredge on 10/27/2021 11:13:52 [name], [name] [name]. ([id number]) -------------------------------------------------------------------------------- Pain Assessment Details Patient Name: [name] Dear [name]. Date of Service: 10/27/2021 10:30 AM Medical Record Number: [id number] Patient Account Number: [id number] Date of Birth/Sex: Mar 16, 1976 (46 y.o. F) Treating RN: Levora Dredge Primary Care Mariadelcarmen Corella: [name] [name] Other Clinician: Referring Semir Brill: [name] [name] Treating Brieana Shimmin/Extender: [name] [name] in Treatment: 31 Active Problems Location of Pain Severity and Description of Pain Patient Has Paino No Site Locations Rate the pain. Current Pain Level: 0 Pain Management and Medication Current Pain Management: Electronic Signature(s) Signed: 10/27/2021 4:31:39 PM By: Levora Dredge Entered By: Levora Dredge on 10/27/2021 10:48:43 [name] ([id number]) -------------------------------------------------------------------------------- Patient/Caregiver Education Details Patient Name: [name] Dear [name]. Date of Service: 10/27/2021 10:30 AM Medical Record Number: [id number] Patient Account Number: [id number] Date of Birth/Gender: Oct 05, 1976 (46 y.o. F) Treating RN: Levora Dredge Primary Care Physician: [name] [name] Other Clinician: Referring Physician: Raelene [name] Treating Physician/Extender: [name] [name] in Treatment: 26 Education Assessment Education Provided To: Patient Education Topics Provided Wound Debridement: Handouts: Wound Debridement Methods: Explain/Verbal Responses: State content correctly Wound/Skin Impairment: Handouts: Caring for Your Ulcer Methods: Explain/Verbal Responses: State content correctly Electronic Signature(s) Signed: 10/27/2021 4:31:39 PM By: Levora Dredge Entered By: Levora Dredge on 10/27/2021 12:36:36 [name] ([id number]) -------------------------------------------------------------------------------- Wound Assessment Details Patient Name: [name] Dear [name]. Date of Service: 10/27/2021 10:30 AM Medical Record Number: [id number] Patient Account Number: [id number] Date of Birth/Sex: 08-Oct-1976 (46 y.o. F) Treating RN: Levora Dredge Primary Care Kinslei Labine: [name] [name] Other Clinician: Referring Gerhardt Gleed: [name] [name] Treating Yurem Viner/Extender: [name] [name] in Treatment: 31 Wound Status Wound Number: 7 Primary Diabetic Wound/Ulcer of the  Lower Extremity Etiology: Wound Location:  Left, Midline, Plantar Foot Wound Open Wounding Event: Gradually Appeared Status: Date Acquired: 08/07/2020 Comorbid Chronic sinus problems/congestion, Middle ear problems, Weeks Of Treatment: 31 History: Anemia, Chronic Obstructive Pulmonary Disease (COPD), Clustered Wound: No Congestive Heart Failure, Type II Diabetes, End Stage Renal Disease, History of pressure wounds, Neuropathy Photos Wound Measurements Length: (cm) 4.6 % Width: (cm) 2.1 % Depth: (cm) 0.4 E Area: (cm) 7.587 Volume: (cm) 3.035 Reduction in Area: -184.2% Reduction in Volume: -127.3% pithelialization: None Tunneling: No Undermining: No Wound Description Classification: Grade 2 Wound Margin: Thickened Exudate Amount: Large Exudate Type: Serosanguineous Exudate Color: red, brown Foul Odor After Cleansing: No Slough/Fibrino Yes Wound Bed Granulation Amount: Large (67-100%) Exposed Structure Granulation Quality: Pink, Hyper-granulation Fascia Exposed: No Necrotic Amount: Small (1-33%) Fat Layer (Subcutaneous Tissue) Exposed: Yes Necrotic Quality: Adherent Slough Tendon Exposed: No Muscle Exposed: No Joint Exposed: No Bone Exposed: No Treatment Notes Wound #7 (Foot) Wound Laterality: Plantar, Left, Midline Cleanser Dakin 16 (oz) 0.25 Discharge Instruction: Use as directed. [name], ALBARRACIN [name]. ([id number]) Peri-Wound Care Topical Primary Dressing Gauze Discharge Instruction: As directed: moistened with Dakins Solution Secondary Dressing Secured With Kerlix Roll Sterile or Non-Sterile 6-ply 4.5x4 (yd/yd) Discharge Instruction: Apply Kerlix as directed Compression Wrap Compression Stockings Add-Ons Electronic Signature(s) Signed: 10/27/2021 4:31:39 PM By: Levora Dredge Entered By: Levora Dredge on 10/27/2021 10:59:58 [name], [name] [name]. ([id number]) -------------------------------------------------------------------------------- Wound Assessment Details Patient Name: [name] Dear [name]. Date  of Service: 10/27/2021 10:30 AM Medical Record Number: [id number] Patient Account Number: [id number] Date of Birth/Sex: [birth year] (46 y.o. F) Treating RN: Levora Dredge Primary Care Claretha Townshend: [name] [name] Other Clinician: Referring Laurynn Mccorvey: [name] [name] Treating Daris Harkins/Extender: [name] [name] in Treatment: 31 Wound Status Wound Number: 9 Primary Diabetic Wound/Ulcer of the Lower Extremity Etiology: Wound Location: Right Calcaneus Wound Open Wounding Event: Gradually Appeared Status: Date Acquired: 07/18/2021 Comorbid Chronic sinus problems/congestion, Middle ear problems, Weeks Of Treatment: 14 History: Anemia, Chronic Obstructive Pulmonary Disease (COPD), Clustered Wound: No Congestive Heart Failure, Type II Diabetes, End Stage Renal Disease, History of pressure wounds, Neuropathy Photos Wound Measurements Length: (cm) 0.4 % Reduc Width: (cm) 0.4 % Reduc Depth: (cm) 0.3 Epithel Area: (cm) 0.126 Tunnel Volume: (cm) 0.038 Underm Star Endi Maxi tion in Area: 61.8% tion in Volume: 83.5% ialization: None ing: No ining: Yes ting Position (o'clock): 9 ng Position (o'clock): 3 mum Distance: (cm) 0.3 Wound Description Classification: Grade 1 Foul O Wound Margin: Well defined, not attached Slough Exudate Amount: Medium Exudate Type: Serosanguineous Exudate Color: red, brown dor After Cleansing: No /Fibrino Yes Wound Bed Granulation Amount: None Present (0%) Exposed Structure Necrotic Amount: Large (67-100%) Fascia Exposed: No Necrotic Quality: Eschar Fat Layer (Subcutaneous Tissue) Exposed: No Tendon Exposed: No Muscle Exposed: No Joint Exposed: No Bone Exposed: No Treatment Notes Wound #9 (Calcaneus) Wound Laterality: Right [name] [name], [name] [name]. ([id number]) Cleanser Soap and Water Discharge Instruction: Gently cleanse wound with antibacterial soap, rinse and pat dry prior to dressing wounds Peri-Wound Care Topical Primary Dressing Hydrofera Blue  Ready Transfer Foam, 2.5x2.5 (in/in) Discharge Instruction: Apply Hydrofera Blue Ready to wound bed as directed Secondary Dressing Bordered Gauze Sterile-HBD 4x4 (in/in) Discharge Instruction: Cover wound with Bordered Guaze Sterile as directed Secured With coverlet Compression Wrap Compression Stockings Add-Ons Electronic Signature(s) Signed: 10/27/2021 4:31:39 PM By: Levora Dredge Entered By: Levora Dredge on 10/27/2021 10:58:42 [name] ([id number]) -------------------------------------------------------------------------------- Vitals Details Patient Name: [name] Dear [name]. Date of Service: 10/27/2021 10:30 AM Medical Record Number: [id number] Patient Account Number: [id number] Date  of Birth/Sex: 17-Nov-1975 (46 y.o. F) Treating RN: Levora Dredge Primary Care Gabrian Hoque: [name] [name] Other Clinician: Referring Hemi Chacko: [name] [name] Treating Daniah Zaldivar/Extender: [name] [name] in Treatment: 31 Vital Signs Time Taken: 10:45 Temperature (F): 98.3 Height (in): 69 Pulse (bpm): 77 Weight (lbs): 185 Respiratory Rate (breaths/min): 18 Body Mass Index (BMI): 27.3 Blood Pressure (mmHg): 103/75 Reference Range: 80 - 120 mg / dl Electronic Signature(s) Signed: 10/27/2021 4:31:39 PM By: Levora Dredge Entered By: Levora Dredge on 10/27/2021 10:48:30

## 2021-11-03 ENCOUNTER — Other Ambulatory Visit: Payer: Self-pay

## 2021-11-03 ENCOUNTER — Encounter (HOSPITAL_BASED_OUTPATIENT_CLINIC_OR_DEPARTMENT_OTHER): Payer: Medicaid Other | Admitting: Internal Medicine

## 2021-11-03 DIAGNOSIS — E11621 Type 2 diabetes mellitus with foot ulcer: Secondary | ICD-10-CM

## 2021-11-03 DIAGNOSIS — L97512 Non-pressure chronic ulcer of other part of right foot with fat layer exposed: Secondary | ICD-10-CM | POA: Diagnosis not present

## 2021-11-03 DIAGNOSIS — L97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ity: Secondary | ICD-10-CM | POA: Diagnosis not present

## 2021-11-04 NOTE — Progress Notes (Signed)
[name] ([id number]) Visit Report for 11/03/2021 Chief Complaint Document Details Patient Name: [name], [name] Date of Service: 11/03/2021 10:30 AM Medical Record Number: [id number] Patient Account Number: [id number] Date of Birth/Sex: 1976/06/27 (46 y.o. F) Treating RN: [name] Primary Care Provider: Raelene [name] Other Clinician: Referring Provider: Raelene [name] Treating Provider/Extender: [name] in Treatment: 87 Information Obtained from: Patient Chief Complaint 03/19/2021; patient referred by [name] who has been looking after her left foot for quite a period of time for review of a nonhealing area in the left midfoot Electronic Signature(s) Signed: 11/03/2021 11:36:33 AM By: Heather Shan DO Entered By: [name] on 11/03/2021 11:32:00 [name] ([id number]) -------------------------------------------------------------------------------- Debridement Details Patient Name: Heather Dear A. Date of Service: 11/03/2021 10:30 AM Medical Record Number: [id number] Patient Account Number: [id number] Date of Birth/Sex: 08-12-1976 (46 y.o. F) Treating RN: [name] Primary Care Provider: Raelene [name] Other Clinician: Referring Provider: Raelene [name] Treating Provider/Extender: [name] in Treatment: 32 Debridement Performed for Wound #7 Left,Midline,Plantar Foot Assessment: Performed By: Physician Heather Shan, MD Debridement Type: Debridement Severity of Tissue Pre Debridement: Fat layer exposed Level of Consciousness (Pre- Awake and Alert procedure): Pre-procedure Verification/Time Out Yes - 11:00 Taken: Pain Control: Lidocaine 4% Topical Solution Total Area Debrided (L x W): 4.7 (cm) x 2 (cm) = 9.4 (cm) Tissue and other material Viable, Non-Viable, Slough, Subcutaneous, Slough debrided: Level: Skin/Subcutaneous Tissue Debridement Description: Excisional Instrument: Curette Bleeding:  Minimum Hemostasis Achieved: Pressure Response to Treatment: Procedure was tolerated well Level of Consciousness (Post- Awake and Alert procedure): Post Debridement Measurements of Total Wound Length: (cm) 4.7 Width: (cm) 2 Depth: (cm) 0.3 Volume: (cm) 2.215 Character of Wound/Ulcer Post Debridement: Stable Severity of Tissue Post Debridement: Fat layer exposed Post Procedure Diagnosis Same as Pre-procedure Electronic Signature(s) Signed: 11/03/2021 11:36:33 AM By: Heather Shan DO Signed: 11/04/2021 12:48:18 PM By: [name] Entered By: [name] on 11/03/2021 11:01:46 [name] ([id number]) -------------------------------------------------------------------------------- Debridement Details Patient Name: Heather Dear A. Date of Service: 11/03/2021 10:30 AM Medical Record Number: [id number] Patient Account Number: [id number] Date of Birth/Sex: 14-Nov-1975 (46 y.o. F) Treating RN: [name] Primary Care Provider: Raelene [name] Other Clinician: Referring Provider: Raelene [name] Treating Provider/Extender: [name] in Treatment: 32 Debridement Performed for Wound #9 Right Calcaneus Assessment: Performed By: Physician Heather Shan, MD Debridement Type: Debridement Severity of Tissue Pre Debridement: Fat layer exposed Level of Consciousness (Pre- Awake and Alert procedure): Pre-procedure Verification/Time Out Yes - 11:03 Taken: Total Area Debrided (L x W): 0.3 (cm) x 0.5 (cm) = 0.15 (cm) Tissue and other material Non-Viable, Callus, Slough, Subcutaneous, Slough debrided: Level: Skin/Subcutaneous Tissue Debridement Description: Excisional Instrument: Curette Bleeding: Minimum Hemostasis Achieved: Pressure Response to Treatment: Procedure was tolerated well Level of Consciousness (Post- Awake and Alert procedure): Post Debridement Measurements of Total Wound Length: (cm) 1 Width: (cm) 1.5 Depth: (cm) 0.3 Volume: (cm)  0.353 Character of Wound/Ulcer Post Debridement: Stable Severity of Tissue Post Debridement: Fat layer exposed Post Procedure Diagnosis Same as Pre-procedure Electronic Signature(s) Signed: 11/03/2021 11:36:33 AM By: Heather Shan DO Signed: 11/04/2021 12:48:18 PM By: [name] Entered By: [name] on 11/03/2021 11:05:16 [name], [name] ([id number]) -------------------------------------------------------------------------------- HPI Details Patient Name: Heather Dear A. Date of Service: 11/03/2021 10:30 AM Medical Record Number: [id number] Patient Account Number: [id number] Date of Birth/Sex: 01/18/1976 (46 y.o. F) Treating RN: [name] Primary Care Provider: Raelene [name] Other Clinician: Referring Provider: Raelene [name] Treating Provider/Extender: [name] in Treatment: 32 History of  Present Illness HPI Description: 01/18/18-She is here for initial evaluation of the left great toe ulcer. She is a poor historian in regards to timeframe in detail. She states approximately 4 weeks ago she lacerated her toe on something in the house. She followed up with her primary care who placed her on Bactrim and ultimately a second dose of Bactrim prior to coming to wound clinic. She states she has been treating the toe with peroxide, Betadine and a Band-Aid. She did not check her blood sugar this morning but checked it yesterday morning it was 327; she is unaware of a recent A1c and there are no current records. She saw Dr. she would've orthopedics last week for an old injury to the left ankle, she states he did not see her toe, nor did she bring it to his attention. She smokes approximately 1 pack cigarettes a day. Her social situation is concerning, she arrives this morning with her mother who appears extremely intoxicated/under the influence; her mother was asked to leave the room and be monitored by the patient's grandmother. The patient's aunt then  accompanied the patient and the room throughout the rest of the appointment. We had a lengthy discussion regarding the deleterious effects of uncontrolled hyperglycemia and smoking as it relates to wound healing and overall health. She was strongly encouraged to decrease her smoking and get her diabetes under better control. She states she is currently on a diet and has cut down her [facility] consumption. The left toe is erythematous, macerated and slightly edematous with malodor present. The edema in her left foot is below her baseline, there is no erythema streaking. We will treat her with Santyl, doxycycline; we have ordered and xray, culture and provided a Peg assist surgical shoe and cultured the wound. 01/25/18-She is here in follow-up evaluation for a left great toe ulcer and presents with an abscess to her suprapubic area. She states her blood sugars remain elevated, feeling "sick" and if levels are below 250, but she is trying. She has made no attempt to decrease her smoking stating that we "can't take away her food in her cigarettes". She has been compliant with offloading using the PEG assist you. She is using Santyl daily. the culture obtained last week grew staph aureus and Enterococcus faecalis; continues on the doxycycline and Augmentin was added on Monday. The suprapubic area has erythema, no femoral variation, purple discoloration, minimal induration, was accessed with a cotton tip applicator with sanguinopurulent drainage, this was cultured, I suspect the current antibiotic treatment will cover and we will not add anything to her current treatment plan. She was advised to go to urgent care or ER with any change in redness, induration or fever. 02/01/18-She is here in follow-up evaluation for left great toe ulcers and a new abdominal abscess from last week. She was able to use packing until earlier this week, where she "forgot it was there". She states she was feeling ill with GI  symptoms last week and was not taking her antibiotic. She states her glucose levels have been predominantly less than 200, with occasional levels between 200-250. She thinks this was contributing to her GI symptoms as they have resolved without intervention. There continues to be significant laceration to left toe, otherwise it clinically looks stable/improved. There is now less superficial opening to the lateral aspect of the great toe that was residual blister. We will transition to [facility] to all wounds, she will continue her Augmentin. If there is no change  or deterioration next week for reculture. 02/08/18-She is here in follow-up evaluation for left great toe ulcer and abdominal ulcer. There is an improvement in both wounds. She has been wrapping her left toe with coban, not by our direction, which has created an area of discoloration to the medial aspect; she has been advised to NOT use coban secondary to her neuropathy. She states her glucose levels have been high over this last week ranging from 200-350, she continues to smoke. She admits to being less compliant with her offloading shoe. We will continue with same treatment plan and she will follow-up next week. 02/15/18-She is here in follow-up evaluation for left great toe ulcer and abdominal ulcer. The abdominal ulcer is epithelialized. The left great toe ulcer is improved and all injury from last week using the Coban wrap is resolved, the lateral ulcer is healed. She admits to noncompliance with wearing offloading shoe and admits to glucose levels being greater than 300 most of the week. She continues to smoke and expresses no desire to quit. There is one area medially that probes deeper than it has historically, erythema to the toe and dorsal foot has consistently waxed and waned. There is no overt signs of cellulitis or infection but we will culture the wound for any occult infection given the new area of depth and erythema. We  will hold off on sensitivities for initiation of antibiotic therapy. 02/22/18-She is here in follow up evaluation for left great toe ulcer. There is overall significant improvement in both wound appearance, erythema and edema with changes made last week. She was not initiated on antibiotic therapy. Culture obtained last week showed oxacillin sensitive staph aureus, sensitive to clindamycin. Clindamycin has been called into the pharmacy but she has been instructed to hold off on initiation secondary to overall clinical improvement and her history of antibiotic intolerance. She has been instructed to contact the clinic with any noted changes/deterioration and the wound, erythema, edema and/or pain. She will follow-up next week. She continues to smoke and her glucose levels remain elevated >250; she admits to compliance with offloading shoe 03/01/18 on evaluation today patient appears to be doing fairly well in regard to her left first toe ulcer. She has been tolerating the dressing changes with the [facility] Dressing without complication and overall this has definitely showed signs of improvement according to records as well is what the patient tells me today. I'm very pleased in that regard. She is having no pain today 03/08/18 She is here for follow up evaluation of a left great toe ulcer. She remains non-compliant with glucose control and smoking cessation; glucose levels consistently >200. She states that she got new shoe inserts/peg assist. She admits to compliance with offloading. Since my last evaluation there is significant improvement. We will switch to prisma at this time and she will follow up next week. She is noted to be tachycardic at this appointment, heart rate 120s; she has a history of heart rate 70-130 according to our records. She admits to extreme agitation r/t personal issues; she was advised to monitor her heartrate and contact her physician if it does not return to a more normal  range (<100). She takes cardizem twice daily. 03/15/18-She is here in follow-up evaluation for left great toe ulcer. She remains noncompliant with glucose control and smoking cessation. She admits to compliance with wearing offloading shoe. The ulcer is improved/stable and we will continue with the same treatment plan and she will follow-up next week 03/22/18-She is  here for evaluation for left great toe ulcer. There continues to be significant improvement despite recurrent hyperglycemia (over 500 yesterday) and she continues to smoke. She has been compliant with offloading and we will continue with same treatment plan and she will follow-up next week. 03/29/18-She is here for evaluation for left great toe ulcer. Despite continuing to smoke and uncontrolled diabetes she continues to improve. She is compliant with offloading shoe. We will continue with the same treatment plan and she will follow-up next week 04/05/18- She is here in follow up evaluation for a left great toe ulcer; she presents with small pustule to left fifth toe (resembles ant bite). She admits to compliance with wearing offloading shoe; continues to smoke or have uncontrolled blood glucose control. There is more callus than usual with evidence of bleeding; she denies known trauma. 04/12/18-She is here for evaluation of left great toe ulcer. Despite noncompliance with glycemic control and smoking she continues to make [name], [name] ([id number]) improvement. She continues to wear offloading shoe. The pustule, that was identified last week, to the left fifth toe is resolved. She will follow-up in 2 weeks 05/03/18-she is seen in follow-up evaluation for a left great toe ulcer. She is compliant with offloading, otherwise noncompliant with glycemic control and smoking. She has plateaued and there is minimal improvement noted. We will transition to [facility], replaced the insert to her surgical shoe and she will follow-up in one  week 05/10/18- She is here in follow up evaluation for a left great toe ulcer. It appears stable despite measurement change. We will continue with same treatment plan and follow up next week. 05/24/18-She is seen in follow-up evaluation for a left great toe ulcer. She remains compliant with offloading, has made significant improvement in her diet, decreasing the amount of sugar/soda. She said her recent A1c was 10.9 which is lower than. She did see a diabetic nutritionist/educator yesterday. She continues to smoke. We will continue with the same treatment plan and she'll follow-up next week. 05/31/18- She is seen in follow-up evaluation for left great toe ulcer. She continues to remain compliant with offloading, continues to make improvement in her diet, increasing her water and decreasing the amount of sugar/soda. She does continue to smoke with no desire to quit. We will apply Prisma to the depth and Hydrofera Blue over. We have not received insurance authorization for oasis. She will follow up next week. 06/07/18-She is seen in follow-up evaluation for left great toe ulcer. It has stalled according to today's measurements although base appears stable. She says she saw a diabetic educator yesterday; her average blood sugars are less than 300 which is an improvement for her. She continues to smoke and states "that's my next step" She continues with water over soda. We will order for xray, culture and reinstate ace wrap compression prior to placing apligraf for next week. She is voicing no complaints or concerns. Her dressing will change to iodoflex over the next week in preparation for apligraf. 06/14/18-She is seen in follow-up evaluation for left great toe ulcer. Plain film x-ray performed last week was negative for osteomyelitis. Wound culture obtained last week grew strep B and OSSA; she is initiated on keflex and cefdinir today; there is erythema to the toe which could be from ace wrap compression,  she has a history of wrapping too tight and has has been encouraged to maintain ace wraps that we place today. We will hold off on application of apligraf today, will apply next  week after antibiotic therapy has been initiated. She admits today that she has resumed taking a shower with her foot/toe submerged in water, she has been reminded to keep foot/toe out of the bath water. She will be seen in follow up next week 06/21/18-she is seen in follow-up evaluation for left great toe ulcer. She is tolerating antibiotic therapy with no GI disturbance. The wound is stable. Apligraf was applied today. She has been decreasing her smoking, only had 4 cigarettes yesterday and 1 today. She continues being more compliant in diabetic diet. She will follow-up next week for evaluation of site, if stable will remove at 2 weeks. 06/28/18- She is here in follow up evalution. Apligraf was placed last week, she states the dressing fell off on Tuesday and she was dressing with hydrofera blue. She is healed and will be discharged from the clinic today. She has been instructed to continue with smoking cessation, continue monitoring glucose levels, offloading for an additional 4 weeks and continue with hydrofera blue for additional two weeks for any possible microscopic opening. Readmission: 08/07/18 on evaluation today patient presents for reevaluation concerning the ulcer of her right great toe. She was previously discharged on 06/28/18 healed. Nonetheless she states that this began to show signs of drainage she subsequently went to her primary care provider. Subsequently an x-ray was performed on 08/01/18 which was negative. The patient was also placed on antibiotics at that time. Fortunately they should have been effective for the infection. Nonetheless she's been experiencing some improvement but still has a lot of drainage coming from the wound itself. 08/14/18 on evaluation today patient's wound actually does show signs  of improvement in regard to the erythema at this point. She has completed the antibiotics. With that being said we did discuss the possibility of placing her in a total contact cast as of today although I think that I may want to give this just a little bit more time to ensure nothing recurrence as far as her infection is concerned. I do not want to put in the cast and risk infection at that time if things are not completely resolved. With that being said she is gonna require some debridement today. 08/21/18 on evaluation today patient actually appears to be doing okay in regard to her toe ulcer. She's been tolerating the dressing changes without complication. With that being said it does appear that she is ready and in fact I think it's appropriate for [place] to go ahead and initiate the total contact cast today. Nonetheless she will require some sharp debridement to prepare the wound for application. Overall I feel like things have been progressing well but we do need to do something to get this to close more readily. 08/24/18 patient seen today for reevaluation after having had the total contact cast applied on Tuesday. She seems to have done very well the wound appears to be doing great and overall I'm pleased with the progress that she's made. There were no abnormal areas of rubbing from the cast on her lower extremity. 08/30/18 on evaluation today patient actually appears to be completely healed in regard to her plantar toe ulcer. She tells me at this point she's been having a lot of issues with the cast. She almost fell a couple of times the state shall the step of her dog a couple times as well. This is been a very frustrating process for her other nonetheless she has completely healed the wound which is excellent news. Overall there does  not appear to be the evidence of infection at this time which is great news. 09/11/18 evaluation today patient presents for follow-up concerning her great toe  ulcer on the left which has unfortunately reopened since I last saw her which was only a couple of weeks ago. Unfortunately she was not able to get in to get the shoe and potentially the AFO that's gonna be necessary due to her left foot drop. She continues with offloading shoe but this is not enough to prevent her from reopening it appears. When we last had her in the total contact cast she did well from a healing standpoint but unfortunately the wound reopened as soon as she came out of the cast within just a couple of weeks. 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 This is also part of the reason why she falls on a regular basis. I just do not believe that is gonna be safe for [place] to reinitiate the total contact cast as last time we had this on she fell 3 times one week which is definitely not normal for her. 09/18/18 upon evaluation today the patient actually appears to be doing about the same in regard to her toe ulcer. She did not contact [facility] as I asked her to even though I had given her the prescription. In fact she actually states that she has no idea where the prescription is. She did apparently call [employer] and they told her that all she needed to do was bring the prescription in order to be able to be seen and work on getting the AFO for her left foot. With all that being said she still does not have an appointment and I'm not sure were things stand that regard. I will give her a new prescription today in order to contact them to get this set up. 09/25/18 on evaluation today patient actually appears to be doing about the same in regard to her toes ulcer. She does have a small areas which seems to have a lot of callous buildup around the edge of the wound which is going to need sharp  debridement today. She still is waiting to be scheduled for evaluation with [employer] for possibility of an AFO. She states there supposed to call her tomorrow to get this set up. Unfortunately it does appear that her foot specifically the toe area is showing signs of erythema. There does not appear to be any systemic infection which is in these good news. [name], [name] ([id number]) 10/02/18 on evaluation today patient actually appears to be doing about the same in regard to her toe ulcer. This really has not done too well although it's not significantly larger it's also not significantly smaller. She has been tolerating the dressing changes without complication. She actually has her appointment with [facility] and [place] tomorrow to hopefully be measured for obtaining and AFO splint. I think this would be helpful preventing this from reoccurring. We had contemplated starting the cast this week although to be honest I am reluctant to do that as she's been having nausea, vomiting, and seizure activity over the past three days. She has a history of seizures and have been told is nothing that can be done for these. With that being said I do believe that along  with the seizures have the nausea vomiting which upon further questioning doesn't seem to be the normal for her and makes me concerned for the possibility of infection or something else going on. I discussed this with the patient and her mother during the office visit today. I do not feel the wound is effective but maybe something else. The responses this was "this just happens to her at times and we don't know why". They did not seem to be interested in going to the hospital to have this checked out further. 10/09/18 on evaluation today patient presents for follow-up concerning her ongoing toe ulcer. She has been tolerating the dressing changes without complication. Fortunately there does not appear to be any evidence of infection which is great  news however I do think that the patient would benefit from going ahead for with the total contact cast. She's actually in a wheelchair today she tells me that she will use her walker if we initiate the cast. I was very specific about the fact that if we were gonna do the cast I wanted to make sure that she was using the walker in order to prevent any falls. She tells me she does not have stairs that she has to traverse on a regular basis at her home. She has not had any seizures since last week again that something that happens to her often she tells me she did talk to [employer] and they said that it may take up to three weeks to get the brace approved for her. Hopefully that will not take that long but nonetheless in the meantime I do think the cast could be of benefit. 10/12/18 on evaluation today patient appears to be doing rather well in regard to her toe ulcer. It's just been a few days and already this is significantly improved both as far as overall appearance and size. Fortunately there's no sign of infection. She is here for her first obligatory cast change. 10/19/18 Seen today for follow up and management of left great toe ulcer. Wound continues to show improvement. Noted small open area with seroussang drainage with palpation. Denies any increased pain or recent fevers during visit. She will continue calcium alginate with offloading shoe. Denies any questions or concerns during visit. 10/26/18 on evaluation today patient appears to be doing about the same as when I last saw her in regard to her wound bed. Fortunately there does not appear to be any signs of infection. Unfortunately she continues to have a breakdown in regard to the toe region any time that she is not in the cast. It takes almost no time at all for this to happen. Nonetheless she still has not heard anything from the brace being made by [employer] as to when exactly this will be available to her. Fortunately there is no signs of  infection at this time. 10/30/18 on evaluation today patient presents for application of the total contact cast as we just received him this morning. Fortunately we are gonna be able to apply this to her today which is great news. She continues to have no significant pain which is good news. Overall I do feel like things have been improving while she was the cast is when she doesn't have a cast that things get worse. She still has not really heard anything from [place] regarding her brace. 11/02/18 upon evaluation today patient's wound already appears to be doing significantly better which is good news. Fortunately there does not appear to be any  signs of infection also good news. Overall I do think the total contact cast as before is helping to heal this area unfortunately it's just not gonna likely keep the area closed and healed without her getting her brace at least. Again the foot drop is a significant issue for her. 11/09/18 on evaluation today patient appears to be doing excellent in regard to her toe ulcer which in fact is completely healed. Fortunately we finally got the situation squared away with the paperwork which was needed to proceed with getting her brace approved by Medicaid. I have filled that out unfortunately that information has been sent to the orthopedic office that I worked at 2 1/2 years ago and not tired Current wound care measures. Fortunately she seems to be doing very well at this time. 11/23/18 on evaluation today patient appears to be doing More Poorly Compared to Last Time I Saw Her. At [facility] She Had Completely Healed. Currently she is continuing to have issues with reopening. She states that she just found out that the brace was approved through Medicaid now she just has to go get measured in order to have this fitted for her and then made. Subsequently she does not have an appointment for this yet that is going to complicate things we obviously cannot put her back in  the cast if we do not have everything measured because they're not gonna be able to measure her foot while she is in the cast. 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 Obviously this is unfortunate and does have me somewhat worried as well. 11/30/18 on evaluation today patient's toe ulcer actually appears to be doing fairly well. The good news is she will be getting her brace in the shoes next week on Wednesday. Hopefully we will be able to get this to heal without having to go back in the cast however she may need the cast in order to get the wound completely heal and then go from there. Fortunately there's no signs of infection at this time. 12/07/18 on evaluation today patient fortunately did receive her brace and she states she could tell this definitely makes her walk better. With that being said she's been having issues with her toe where she noticed yesterday there was a lot of tissue that was loosing off this appears to be much larger than what it was previous. She also states that her leg has been read putting much across the top of her foot just about the ankle although this seems to be receiving somewhat. The total area is still red and appears to be someone infected as best I can tell. She is previously taken Bactrim and that may be a good option for her today as well. We are gonna see what I wound culture shows as well and I think that this is definitely appropriate. With that being said outside of the culture I still need to initiate something in the interim and that's what I'm gonna go ahead and select Bactrim is a good option for her. 12/14/18 on evaluation today patient appears to be doing better in regard to her left great toe ulcer as compared to last week's evaluation. There's still some erythema although this is significantly improved which is excellent news. Overall I do believe that  she is making good progress is still gonna take some time before she is where I  would like her to be from the standpoint of being able to place her back into the total contact cast. Hopefully we will be where we need to be by next week. 12/21/18 on evaluation today patient actually appears to be doing poorly in regard to her toe ulcer. She's been tolerating the dressing changes without complication. Fortunately there's no signs of systemic infection although she does have a lot of drainage from the toe ulcer and this does seem to be causing some issues at this point. She does have erythema on the distal portion of her toe that appears to be likely cellulitis. 12/28/18 on evaluation today patient actually appears to be doing a little better in my pinion in regard to her toe ulcer. With that being said she still does have some evidence of infection at this time and for her culture she had both E. coli as well as enterococcus as organisms noted on evaluation. For that reason I think that though the Keflex likely has treated the E. coli rather well this has really done nothing for the enterococcus. We are going to have to initiate treatment for this specifically. JANESSA, MICKLE A. ([id number]) 01/04/19 on evaluation today patient's toe actually appears to be doing better from the standpoint of infection. She currently would like to see about putting the cash back on I think that this is appropriate as long as she takes care of it and keeps it from getting wet. She is gonna have some drainage we can definitely pass this up with Drawtex and alginate to try to prevent as much drainage as possible from causing the problems. With that being said I do want to at least try her with the cast between now and Tuesday. If there any issues we can't continue to use it then I will discontinue the use of the cast at that point. 01/08/19 on evaluation today patient actually appears to be doing very well as far as her foot  ulcer specifically the great toe on the left is concerned. She did have an area of rubbing on the medial aspect of her left ankle which again is from the cast. Fortunately there's no signs of infection at this point in this appears to be a very slight skin breakdown. The patient tells me she felt it rubbing but didn't think it was that bad. Fortunately there is no signs of active infection at this time which is good news. No fevers, chills, nausea, or vomiting noted at this time. 01/15/19 on evaluation today patient actually appears to be doing well in regard to her toe ulcer. Again as previous she seems to do well and she has the cast on which indicates to me that during the time she doesn't have a cast on she's putting way too much pressure on this region. Obviously I think that's gonna be an issue as with the current national emergency concerning the Covid-19 Virus it has been recommended that we discontinue the use of total contact casting by the chief medical officer of our company, Dr. Simona Huh. The reasoning is that if a patient becomes sick and cannot come into have the cast removed they could not just leave this on for an additional two weeks. Obviously the hospitals also do not want to receive patient's who are sick into the emergency department to potentially contaminate the region and spread the Covid-19 Virus among other sick individuals within the hospital system. Therefore at this point we are suspending the use of total contact cast until  the current emergency subsides. This was all discussed with the patient today as well. 01/22/19 on evaluation today patient's wound on her left great toe appears to be doing slightly worse than previously noted last week. She tells me that she has been on this quite a bit in fact she tells me she's been awake for 38 straight hours. This is due to the fact that she's having to care for grandparents because nobody else will. She has been taking care of them  for five the last seven days since I've seen her they both have dementia his is from a stroke and her grandmother's was progressive. Nonetheless she states even her mom who knows her condition and situation has only help two of those days to take care of them she's been taking care of the rest. Fortunately there does not appear to be any signs of active infection in regard to her toe at this point although obviously it doesn't look as good as it did previous. I think this is directly related to her not taking off the pressure and friction by way of taking things easy. Though I completely understand what's going on. 01/29/19 on evaluation today patient's tools are actually appears to be showing some signs of improvement today compared to last week's evaluation as far as not necessarily the overall size of the wound but the fact that she has some new skin growth in between the two ends of the wound opening. Overall I feel like she has done well she states that she had a family member give her what sounds to be a CAM walker boot which has been helpful as well. 02/05/19 on evaluation today patient's wound bed actually appears to be doing significantly better in regard to her overall appearance of the size of the wound. With that being said she is still having an issue with offloading efficiently enough to get this to close. Apparently there is some signs of infection at this point as well unfortunately. Previously she's done well of Augmentin I really do not see anything that needs to be culture currently but there theme and cellulitis of the foot that I'm seeing I'm gonna go ahead and place her on an antibiotic today to try to help clear this up. 02/12/2019 on evaluation today patient actually appears to be doing poorly in regard to her overall wound status. She tells me she has been using her offloading shoe but actually comes in today wearing her tennis shoe with the AFO brace. Again as I previously  discussed with her this is really not sufficient to allow the area to heal appropriately. Nonetheless she continues to be somewhat noncompliant and I do wonder based on what she has told my nurse in the past as to whether or not a good portion of this noncompliance may be recreational drug and alcohol related. She has had a history of heroin overdose and this was fairly recently in the past couple of months that have been seeing her. Nonetheless overall I feel like her wound looks significantly worse today compared to what it was previous. She still has significant erythema despite the Augmentin I am not sure that this is an appropriate medication for her infection I am also concerned that the infection may have gone down into her bone. 02/19/19 on evaluation today patient actually appears to be doing about the same in regard to her toe ulcer. Unfortunately she continues to show signs of bone exposure and infection at this point. There does  not appear to be any evidence of worsening of the infection but I'm also not really sure that it's getting significantly better. She is on the Augmentin which should be sufficient for the Staphylococcus aureus infection that she has at this point. With that being said she may need IV antibiotics to more appropriately treat this. We did have a discussion today about hyperbaric option therapy. 02/28/19 on evaluation today patient actually appears to be doing much worse in regard to the wound on her left great toe as compared to even my previous evaluation last week. Unfortunately this seems to be training in a pretty poor direction. Her toe was actually now starting to angle laterally and I can actually see the entire joint area of the proximal portion of the digit where is the distal portion of the digit again is no longer even in contact with the joint line. Unfortunately there's a lot more necrotic tissue around the edge and the toe appears to be showing signs  of becoming gangrenous in my pinion. I'm very concerned about were things stand at this point. She did see infectious disease and they are planning to send in a prescription for Sivextro for her and apparently this has been approved. With that being said I don't think she should avoid taking this but at the same time I'm not sure that it's gonna be sufficient to save her toe at this point. 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 03/05/19 on evaluation today patient actually appears to be doing quite well in regard to her toe all things considering. She still has bone exposed but there appears to be much less your thing on overall the appearance of the wound and the toe itself is dramatically improved. She still does have some issues currently obviously with infection she did see vascular as well and there concerned that her blood flow to the toad. For that reason they are setting up for an angiogram next week. 03/14/19 on evaluation today patient appears to be doing very poor in regard to her toe and specifically in regard to the ulceration and the fact that she's starting to notice the toe was leaning even more towards the lateral aspect and the complete joint is visible on the proximal aspect of the joint. Nonetheless she's also noted a significant odor and the tip of the toe is turning more dark and necrotic appearing. Overall I think she is getting worse not better as far as this is concerned. For that reason I am recommending at this point that she likely needs to be seen for likely amputation. READMISSION 03/19/2021 This is a patient that we cared for in this clinic for a prolonged period of time in 2019 and 2020 with a left foot and left first toe wound. I believe she ultimately became  infected and underwent a left first toe amputation. 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 ([id number]) 04/09/20 by [name] In December 2021 she had an ulcer on her right great toe as well as the fourth and fifth toes. 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 She also had an angiogram at that time and underwent angioplasty of the right anterior tibial artery. In  any case she claims that the wound on the right foot is closed I did not look at this today which was probably an oversight although I think that should be done next week. After her surgery she developed a dehiscence but I do not see any follow-up of this. According to Dr. Deborra Medina last review that she was out of the area being cared for by another physician but recently came back to his attention. 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 A culture of this area showed E. coli apparently before she came back to see [name] she was supposed to be receiving antibiotics but she did not really take them. Nor is she offloading this area at all. Finally her last hemoglobin A1c listed in epic was in March 2022 at 14.1 she says things are a lot better since then although I am not sure. 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 She had this wound at the time. During this admission she had E. coli sepsis a TEE was negative. 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 She is still a heavy cigarette smoker. She has a history of bipolar disorder seizure disorder ABI in our clinic was 1.05 6/1; left midfoot in the setting of  a TMA done previously. Round circular wound with a "knuckle" of protruding tissue. The problem is that the knuckle was not attached to any of the surrounding granulation and this probed proximally widely I removed a large portion of this tissue. This wound goes with considerable undermining laterally. I do not feel any bone there was no purulence but this is a deep wound. 6/8; in spite of the debridement I did last week. She arrives with a wound looking exactly the same. A protruding "knuckle" of tissue nonadherent to most of the surrounding tissue. There is considerable depth around this from 6-12 o'clock at 2.7 cm and undermining of 1 cm. This does not look overtly infected and the x-ray I did last week was negative for any osseous abnormalities. We have been using silver collagen 6/15; deep tissue culture I did last week showed moderate staph aureus and moderate Pseudomonas. This will definitely require prolonged antibiotic therapy. The pathology on the protuberant area was negative for malignancy fungus etc. 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 We have been using silver collagen. I am going to be prescribing Levaquin for [address] scan of the foot is down for 7/5 6/22; CT scan of the foot on 7 5. She says she has hardware in the left leg from her previous fracture. She is on the Levaquin for the deep tissue culture I did that showed methicillin sensitive staph aureus and Pseudomonas. I gave her a 2-week supply and she will have another week. She arrives in clinic today with the same protuberant tissue however this is nonadherent to the tissue surrounding it. I am really at a loss to explain this unless there is underlying deep tissue infection 6/29; patient presents for 1 week follow-up. She has been using collagen to the wound bed. She reports taking her antibiotics as prescribed.She has no complaints or issues today. She  denies signs of infection. 7/6; patient  presents for one week followup. She has been using collagen to the wound bed. She states she is taking Levaquin however at times she is not able to keep it down. She denies signs of infection. 7/13; patient presents for 1 week follow-up. She has been using silver alginate to the wound bed. She still has nausea when taking her antibiotics. She denies signs of infection. 7/20; patient presents for 1 week follow-up. She has been using silver alginate with gentamicin cream to the wound bed. She denies any issues and has no complaints today. She denies signs of infection. 7/27; patient presents for 1 week follow-up. She continues to use silver alginate with gentamicin cream to the wound bed. She reports starting her antibiotics. She has no issues or complaints. Overall she reports stability to the wound. 8/3; patient presents for 1 week follow-up. She has been using silver alginate with gentamicin cream to the wound bed. She reports completing all antibiotics. She has no issues or complaints today. She denies signs of infection. 8/17; patient presents for 2-week follow-up. He is to use silver alginate to the wound bed. She has no issues or complaints today. She denies signs of infection. She reports her pain has improved in her foot since last clinic visit 8/24; patient presents for 1 week follow-up. She continues to use silver alginate to the wound bed. She has no issues or complaints. She denies signs of infection. Pain is stable. 9/7; patient presents for follow-up. She missed her last week appointment due to feeling ill. She continues to use silver alginate. She has a new wound to the right lower extremity that is covered in eschar. She states It occurred over the past week and has no idea how it started. She currently denies signs of infection. 9/14; patient presents for follow-up. To the left foot wound she has been using gentamicin cream and silver  alginate. To the right lower extremity wound she has been keeping this covered and has not obtain Santyl. 9/21; patient presents for follow-up. She reports using gentamicin cream and silver alginate to the left foot and Santyl to the right lower extremity wound. She has no issues or complaints today. She denies signs of infection. 9/28; patient presents for follow-up. She reports a new wound to her right heel. She states this occurred a few days ago and is progressively gotten worse. She has been trying to clean the area with a Q-tip and Santyl. She reports stability in the other 2 wounds. She has been using gentamicin cream and silver alginate to the left foot and Santyl to the right lower extremity wound. 10/12; patient presents for follow-up. She reports improvement to the wound beds. She is seeing vein and vascular to discuss the potential of a left BKA. She states they are going to do an arteriogram. She continues to use silver alginate with dressing changes to her wounds. 11/2; patient presents for follow-up. She states she has not been doing dressing changes to the wound beds. She states she is not able to offload the areas. She reports chronic pain to her left foot wound. 11/9; patient presents for follow-up. She came in with only socks on. She states she forgot to put on shoes. It is unclear if she is doing any dressing changes. She currently denies systemic signs of infection. 11/16; patient presents for follow-up. She came again only with socks on. She states she does not wear shoes ever. It is unclear if she does dressing changes. She currently denies systemic  signs of infection. 11/23; patient presents for follow-up. She wore her shoes today. It still unclear exactly what dressing she is using for each wound but she did states she obtained Dakin's solution and has been using this to the left foot wound. She currently denies signs of infection. 11/30; patient presents for follow-up. She  has no issues or complaints today. She currently denies signs of infection. 12/7; patient presents for follow-up. She has no issues or complaints today. She has been using Hydrofera Blue to the right heel wound and Dakin solution to the left foot wound. Her right anterior leg wound is healed. She currently denies signs of infection. [name], [name] ([id number]) 12/14; patient presents for follow-up. She has been using Hydrofera Blue to the right heel and Dakin's to the left foot wounds. She has no issues or complaints today. She denies signs of infection. 12/21; patient presents for follow-up. She reports using Hydrofera Blue to the right heel and Dakin's to the left foot wound. She denies signs of infection. 12/28; patient presents for follow-up. She continues to use Dakin's to the left foot wound and Hydrofera Blue to the right heel wound. She denies signs of infection. 1/4; patient presents for follow-up. She has no issues or complaints today. She denies signs of infection. 1/11; patient presents for follow-up. It is unclear if she has been dressing these wounds over the past week. She currently denies signs of infection. Electronic Signature(s) Signed: 11/03/2021 11:36:33 AM By: Heather Shan DO Entered By: [name] on 11/03/2021 11:32:54 [name] ([id number]) -------------------------------------------------------------------------------- Physical Exam Details Patient Name: Heather Dear A. Date of Service: 11/03/2021 10:30 AM Medical Record Number: [id number] Patient Account Number: [id number] Date of Birth/Sex: 1976-01-24 (47 y.o. F) Treating RN: [name] Primary Care Provider: Raelene [name] Other Clinician: Referring Provider: Raelene [name] Treating Provider/Extender: [name] in Treatment: 30 Constitutional . Cardiovascular . Psychiatric . Notes Left foot (Transmetatarsal amputation): Wound to the plantar aspect that has granulation  tissue with nonviable tissue and increased pockets of depth. There is circumferential callus. Right foot: To the heel there is an open wound with Granulation tissue , Nonviable tissue and Circumferential callus. No obvious signs of infection to any wound. Electronic Signature(s) Signed: 11/03/2021 11:36:33 AM By: Heather Shan DO Entered By: [name] on 11/03/2021 11:33:41 [name] ([id number]) -------------------------------------------------------------------------------- Physician Orders Details Patient Name: Heather Dear A. Date of Service: 11/03/2021 10:30 AM Medical Record Number: [id number] Patient Account Number: [id number] Date of Birth/Sex: 07/09/1976 (46 y.o. F) Treating RN: [name] Primary Care Provider: Raelene [name] Other Clinician: Referring Provider: Raelene [name] Treating Provider/Extender: [name] in Treatment: 61 Verbal / Phone Orders: No Diagnosis Coding Follow-up Appointments o Return Appointment in 1 week. Bathing/ Shower/ Hygiene o Wash wounds with antibacterial soap and water. Off-Loading Left Lower Extremity o Open toe surgical shoe with peg assist. o Turn and reposition every 2 hours o Other: - peg shoe right, off-loader left Medications-Please add to medication list. o P.O. Antibiotics Wound Treatment Wound #7 - Foot Wound Laterality: Plantar, Left, Midline Cleanser: Dakin 16 (oz) 0.25 1 x Per Day/30 Days Discharge Instructions: Use as directed. Topical: Gentamicin 1 x Per Day/30 Days Discharge Instructions: Apply as directed by provider. IN OFFICE ONLY Primary Dressing: Gauze 1 x Per Day/30 Days Discharge Instructions: As directed: moistened with Dakins Solution Secondary Dressing: Kerlix 4.5 x 4.1 (in/yd) 1 x Per Day/30 Days Discharge Instructions: Apply Kerlix 4.5 x 4.1 (in/yd) as instructed Secured With: [employer]  Sterile or Non-Sterile 6-ply 4.5x4 (yd/yd) 1 x Per Day/30 Days Discharge  Instructions: Apply Kerlix as directed Wound #9 - Calcaneus Wound Laterality: Right Cleanser: Soap and Water 1 x Per Day/30 Days Discharge Instructions: Gently cleanse wound with antibacterial soap, rinse and pat dry prior to dressing wounds Topical: Gentamicin 1 x Per Day/30 Days Discharge Instructions: Apply as directed by provider. IN OFFICE ONLY Primary Dressing: Hydrofera Blue Ready Transfer Foam, 2.5x2.5 (in/in) 1 x Per Day/30 Days Discharge Instructions: Apply Hydrofera Blue Ready to wound bed as directed Secondary Dressing: Zetuvit Plus Silicone Border Dressing 4x4 (in/in) 1 x Per Day/30 Days Secured With: coverlet 1 x Per Day/30 Days Patient Medications Allergies: erythromycin base, Neosporin (neo-bac-polym) Notifications Medication Indication Start End Dakin's Solution 11/03/2021 DOSE 1 - miscellaneous 0.125 % solution - use for wet to dry dressings [name], [name] ([id number]) Electronic Signature(s) Signed: 11/03/2021 11:36:33 AM By: Heather Shan DO Previous Signature: 11/03/2021 11:31:03 AM Version By: Heather Shan DO Entered By: [name] on 11/03/2021 11:36:09 [name], [name] ([id number]) -------------------------------------------------------------------------------- Problem List Details Patient Name: Heather Dear A. Date of Service: 11/03/2021 10:30 AM Medical Record Number: [id number] Patient Account Number: [id number] Date of Birth/Sex: January 30, 1976 (46 y.o. 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 03/19/2021 No Yes severity L97.512 Non-pressure chronic ulcer of other part of right foot with fat layer 09/15/2021 No Yes exposed L97.812  Non-pressure chronic ulcer of other part of right lower leg with fat layer 09/15/2021 No Yes exposed E11.621 Type 2 diabetes mellitus with foot ulcer 03/19/2021 No Yes E11.42 Type 2 diabetes mellitus with diabetic polyneuropathy 03/19/2021 No Yes Inactive Problems ICD-10 Code Description Active Date Inactive Date S81.801A Unspecified open wound, right lower leg, initial encounter 06/30/2021 06/30/2021 Resolved Problems Electronic Signature(s) Signed: 11/03/2021 11:36:33 AM By: Heather Shan DO Entered By: [name] on 11/03/2021 11:31:51 [name], [name] ([id number]) -------------------------------------------------------------------------------- Progress Note Details Patient Name: Heather Dear A. Date of Service: 11/03/2021 10:30 AM Medical Record Number: [id number] Patient Account Number: [id number] Date of Birth/Sex: [birth year] (46 y.o. F) Treating RN: [name] Primary Care Provider: Raelene [name] Other Clinician: Referring Provider: Raelene [name] Treating Provider/Extender: [name] in Treatment: 35 Subjective Chief Complaint Information obtained from Patient 03/19/2021; patient referred by [name] who has been looking after her left foot for quite a period of time for review of a nonhealing area in the left midfoot History of Present Illness (HPI) 01/18/18-She is here for initial evaluation of the left great toe ulcer. She is a poor historian in regards to timeframe in detail. She states approximately 4 weeks ago she lacerated her toe on something in the house. She followed up with her primary care who placed her on Bactrim and ultimately a second dose of Bactrim prior to coming to wound clinic. She states she has been treating the toe with peroxide, Betadine and a Band-Aid. She did not check her blood sugar this morning but checked it yesterday morning it was 327; she is unaware of a recent A1c and there are no current records. She saw Dr. she would've  orthopedics last week for an old injury to the left ankle, she states he did not see her toe, nor did she bring it to his attention. She smokes approximately 1 pack cigarettes  a day. Her social situation is concerning, she arrives this morning with her mother who appears extremely intoxicated/under the influence; her mother was asked to leave the room and be monitored by the patient's grandmother. The patient's aunt then accompanied the patient and the room throughout the rest of the appointment. We had a lengthy discussion regarding the deleterious effects of uncontrolled hyperglycemia and smoking as it relates to wound healing and overall health. She was strongly encouraged to decrease her smoking and get her diabetes under better control. She states she is currently on a diet and has cut down her [facility] consumption. The left toe is erythematous, macerated and slightly edematous with malodor present. The edema in her left foot is below her baseline, there is no erythema streaking. We will treat her with Santyl, doxycycline; we have ordered and xray, culture and provided a Peg assist surgical shoe and cultured the wound. 01/25/18-She is here in follow-up evaluation for a left great toe ulcer and presents with an abscess to her suprapubic area. She states her blood sugars remain elevated, feeling "sick" and if levels are below 250, but she is trying. She has made no attempt to decrease her smoking stating that we "can't take away her food in her cigarettes". She has been compliant with offloading using the PEG assist you. She is using Santyl daily. the culture obtained last week grew staph aureus and Enterococcus faecalis; continues on the doxycycline and Augmentin was added on Monday. The suprapubic area has erythema, no femoral variation, purple discoloration, minimal induration, was accessed with a cotton tip applicator with sanguinopurulent drainage, this was cultured, I suspect the current  antibiotic treatment will cover and we will not add anything to her current treatment plan. She was advised to go to urgent care or ER with any change in redness, induration or fever. 02/01/18-She is here in follow-up evaluation for left great toe ulcers and a new abdominal abscess from last week. She was able to use packing until earlier this week, where she "forgot it was there". She states she was feeling ill with GI symptoms last week and was not taking her antibiotic. She states her glucose levels have been predominantly less than 200, with occasional levels between 200-250. She thinks this was contributing to her GI symptoms as they have resolved without intervention. There continues to be significant laceration to left toe, otherwise it clinically looks stable/improved. There is now less superficial opening to the lateral aspect of the great toe that was residual blister. We will transition to [facility] to all wounds, she will continue her Augmentin. If there is no change or deterioration next week for reculture. 02/08/18-She is here in follow-up evaluation for left great toe ulcer and abdominal ulcer. There is an improvement in both wounds. She has been wrapping her left toe with coban, not by our direction, which has created an area of discoloration to the medial aspect; she has been advised to NOT use coban secondary to her neuropathy. She states her glucose levels have been high over this last week ranging from 200-350, she continues to smoke. She admits to being less compliant with her offloading shoe. We will continue with same treatment plan and she will follow-up next week. 02/15/18-She is here in follow-up evaluation for left great toe ulcer and abdominal ulcer. The abdominal ulcer is epithelialized. The left great toe ulcer is improved and all injury from last week using the Coban wrap is resolved, the lateral ulcer is healed. She admits  to noncompliance with wearing offloading shoe  and admits to glucose levels being greater than 300 most of the week. She continues to smoke and expresses no desire to quit. There is one area medially that probes deeper than it has historically, erythema to the toe and dorsal foot has consistently waxed and waned. There is no overt signs of cellulitis or infection but we will culture the wound for any occult infection given the new area of depth and erythema. We will hold off on sensitivities for initiation of antibiotic therapy. 02/22/18-She is here in follow up evaluation for left great toe ulcer. There is overall significant improvement in both wound appearance, erythema and edema with changes made last week. She was not initiated on antibiotic therapy. Culture obtained last week showed oxacillin sensitive staph aureus, sensitive to clindamycin. Clindamycin has been called into the pharmacy but she has been instructed to hold off on initiation secondary to overall clinical improvement and her history of antibiotic intolerance. She has been instructed to contact the clinic with any noted changes/deterioration and the wound, erythema, edema and/or pain. She will follow-up next week. She continues to smoke and her glucose levels remain elevated >250; she admits to compliance with offloading shoe 03/01/18 on evaluation today patient appears to be doing fairly well in regard to her left first toe ulcer. She has been tolerating the dressing changes with the [facility] Dressing without complication and overall this has definitely showed signs of improvement according to records as well is what the patient tells me today. I'm very pleased in that regard. She is having no pain today 03/08/18 She is here for follow up evaluation of a left great toe ulcer. She remains non-compliant with glucose control and smoking cessation; glucose levels consistently >200. She states that she got new shoe inserts/peg assist. She admits to compliance with offloading. Since  my last evaluation there is significant improvement. We will switch to prisma at this time and she will follow up next week. She is noted to be tachycardic at this appointment, heart rate 120s; she has a history of heart rate 70-130 according to our records. She admits to extreme agitation r/t personal issues; she was advised to monitor her heartrate and contact her physician if it does not return to a more normal range (<100). She takes cardizem twice daily. 03/15/18-She is here in follow-up evaluation for left great toe ulcer. She remains noncompliant with glucose control and smoking cessation. She admits to compliance with wearing offloading shoe. The ulcer is improved/stable and we will continue with the same treatment plan and she will follow-up next week 03/22/18-She is here for evaluation for left great toe ulcer. There continues to be significant improvement despite recurrent hyperglycemia (over 500 yesterday) and she continues to smoke. She has been compliant with offloading and we will continue with same treatment plan and she will follow-up next week. [name], [name] ([id number]) 03/29/18-She is here for evaluation for left great toe ulcer. Despite continuing to smoke and uncontrolled diabetes she continues to improve. She is compliant with offloading shoe. We will continue with the same treatment plan and she will follow-up next week 04/05/18- She is here in follow up evaluation for a left great toe ulcer; she presents with small pustule to left fifth toe (resembles ant bite). She admits to compliance with wearing offloading shoe; continues to smoke or have uncontrolled blood glucose control. There is more callus than usual with evidence of bleeding; she denies known trauma. 04/12/18-She is here  for evaluation of left great toe ulcer. Despite noncompliance with glycemic control and smoking she continues to make improvement. She continues to wear offloading shoe. The pustule, that was  identified last week, to the left fifth toe is resolved. She will follow-up in 2 weeks 05/03/18-she is seen in follow-up evaluation for a left great toe ulcer. She is compliant with offloading, otherwise noncompliant with glycemic control and smoking. She has plateaued and there is minimal improvement noted. We will transition to [facility], replaced the insert to her surgical shoe and she will follow-up in one week 05/10/18- She is here in follow up evaluation for a left great toe ulcer. It appears stable despite measurement change. We will continue with same treatment plan and follow up next week. 05/24/18-She is seen in follow-up evaluation for a left great toe ulcer. She remains compliant with offloading, has made significant improvement in her diet, decreasing the amount of sugar/soda. She said her recent A1c was 10.9 which is lower than. She did see a diabetic nutritionist/educator yesterday. She continues to smoke. We will continue with the same treatment plan and she'll follow-up next week. 05/31/18- She is seen in follow-up evaluation for left great toe ulcer. She continues to remain compliant with offloading, continues to make improvement in her diet, increasing her water and decreasing the amount of sugar/soda. She does continue to smoke with no desire to quit. We will apply Prisma to the depth and Hydrofera Blue over. We have not received insurance authorization for oasis. She will follow up next week. 06/07/18-She is seen in follow-up evaluation for left great toe ulcer. It has stalled according to today's measurements although base appears stable. She says she saw a diabetic educator yesterday; her average blood sugars are less than 300 which is an improvement for her. She continues to smoke and states "that's my next step" She continues with water over soda. We will order for xray, culture and reinstate ace wrap compression prior to placing apligraf for next week. She is voicing no  complaints or concerns. Her dressing will change to iodoflex over the next week in preparation for apligraf. 06/14/18-She is seen in follow-up evaluation for left great toe ulcer. Plain film x-ray performed last week was negative for osteomyelitis. Wound culture obtained last week grew strep B and OSSA; she is initiated on keflex and cefdinir today; there is erythema to the toe which could be from ace wrap compression, she has a history of wrapping too tight and has has been encouraged to maintain ace wraps that we place today. We will hold off on application of apligraf today, will apply next week after antibiotic therapy has been initiated. She admits today that she has resumed taking a shower with her foot/toe submerged in water, she has been reminded to keep foot/toe out of the bath water. She will be seen in follow up next week 06/21/18-she is seen in follow-up evaluation for left great toe ulcer. She is tolerating antibiotic therapy with no GI disturbance. The wound is stable. Apligraf was applied today. She has been decreasing her smoking, only had 4 cigarettes yesterday and 1 today. She continues being more compliant in diabetic diet. She will follow-up next week for evaluation of site, if stable will remove at 2 weeks. 06/28/18- She is here in follow up evalution. Apligraf was placed last week, she states the dressing fell off on Tuesday and she was dressing with hydrofera blue. She is healed and will be discharged from the clinic today. She  has been instructed to continue with smoking cessation, continue monitoring glucose levels, offloading for an additional 4 weeks and continue with hydrofera blue for additional two weeks for any possible microscopic opening. Readmission: 08/07/18 on evaluation today patient presents for reevaluation concerning the ulcer of her right great toe. She was previously discharged on 06/28/18 healed. Nonetheless she states that this began to show signs of drainage  she subsequently went to her primary care provider. Subsequently an x-ray was performed on 08/01/18 which was negative. The patient was also placed on antibiotics at that time. Fortunately they should have been effective for the infection. Nonetheless she's been experiencing some improvement but still has a lot of drainage coming from the wound itself. 08/14/18 on evaluation today patient's wound actually does show signs of improvement in regard to the erythema at this point. She has completed the antibiotics. With that being said we did discuss the possibility of placing her in a total contact cast as of today although I think that I may want to give this just a little bit more time to ensure nothing recurrence as far as her infection is concerned. I do not want to put in the cast and risk infection at that time if things are not completely resolved. With that being said she is gonna require some debridement today. 08/21/18 on evaluation today patient actually appears to be doing okay in regard to her toe ulcer. She's been tolerating the dressing changes without complication. With that being said it does appear that she is ready and in fact I think it's appropriate for [place] to go ahead and initiate the total contact cast today. Nonetheless she will require some sharp debridement to prepare the wound for application. Overall I feel like things have been progressing well but we do need to do something to get this to close more readily. 08/24/18 patient seen today for reevaluation after having had the total contact cast applied on Tuesday. She seems to have done very well the wound appears to be doing great and overall I'm pleased with the progress that she's made. There were no abnormal areas of rubbing from the cast on her lower extremity. 08/30/18 on evaluation today patient actually appears to be completely healed in regard to her plantar toe ulcer. She tells me at this point she's been having a lot of  issues with the cast. She almost fell a couple of times the state shall the step of her dog a couple times as well. This is been a very frustrating process for her other nonetheless she has completely healed the wound which is excellent news. Overall there does not appear to be the evidence of infection at this time which is great news. 09/11/18 evaluation today patient presents for follow-up concerning her great toe ulcer on the left which has unfortunately reopened since I last saw her which was only a couple of weeks ago. Unfortunately she was not able to get in to get the shoe and potentially the AFO that's gonna be necessary due to her left foot drop. She continues with offloading shoe but this is not enough to prevent her from reopening it appears. When we last had her in the total contact cast she did well from a healing standpoint but unfortunately the wound reopened as soon as she came out of the cast within just a couple of weeks. 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 This is also part of the reason why she falls on a regular basis. I just do not believe that is gonna be safe for [place] to reinitiate the total contact cast as last time we had this on she fell 3 times one week which is definitely not normal for her. 09/18/18 upon evaluation today the patient actually appears to be doing about the same in regard to her toe ulcer. She did not contact [facility] as I asked her to even though I had given her the prescription. In fact she actually states that she has no idea where the prescription is. She did apparently call [facility] and they told her that all she needed to do was bring the prescription in order to be able to be seen and work on getting the AFO for her left foot. With all that being said  she still does not have an appointment and I'm not sure were things stand that regard. I will give her a new prescription today in order to contact them to get this set up. KHALIAH, BARNICK A. ([id number]) 09/25/18 on evaluation today patient actually appears to be doing about the same in regard to her toes ulcer. She does have a small areas which seems to have a lot of callous buildup around the edge of the wound which is going to need sharp debridement today. She still is waiting to be scheduled for evaluation with [facility] for possibility of an AFO. She states there supposed to call her tomorrow to get this set up. Unfortunately it does appear that her foot specifically the toe area is showing signs of erythema. There does not appear to be any systemic infection which is in these good news. 10/02/18 on evaluation today patient actually appears to be doing about the same in regard to her toe ulcer. This really has not done too well although it's not significantly larger it's also not significantly smaller. She has been tolerating the dressing changes without complication. She actually has her appointment with [facility] and [facility] tomorrow to hopefully be measured for obtaining and AFO splint. I think this would be helpful preventing this from reoccurring. We had contemplated starting the cast this week although to be honest I am reluctant to do that as she's been having nausea, vomiting, and seizure activity over the past three days. She has a history of seizures and have been told is nothing that can be done for these. With that being said I do believe that along with the seizures have the nausea vomiting which upon further questioning doesn't seem to be the normal for her and makes me concerned for the possibility of infection or something else going on. I discussed this with the patient and her mother during the office visit today. I do not feel the wound is effective but maybe something else. The  responses this was "this just happens to her at times and we don't know why". They did not seem to be interested in going to the hospital to have this checked out further. 10/09/18 on evaluation today patient presents for follow-up concerning her ongoing toe ulcer. She has been tolerating the dressing changes without complication. Fortunately there does not appear to be any evidence of infection which is great news however I do think that the patient would benefit from going ahead for with the total contact cast. She's  actually in a wheelchair today she tells me that she will use her walker if we initiate the cast. I was very specific about the fact that if we were gonna do the cast I wanted to make sure that she was using the walker in order to prevent any falls. She tells me she does not have stairs that she has to traverse on a regular basis at her home. She has not had any seizures since last week again that something that happens to her often she tells me she did talk to [employer] and they said that it may take up to three weeks to get the brace approved for her. Hopefully that will not take that long but nonetheless in the meantime I do think the cast could be of benefit. 10/12/18 on evaluation today patient appears to be doing rather well in regard to her toe ulcer. It's just been a few days and already this is significantly improved both as far as overall appearance and size. Fortunately there's no sign of infection. She is here for her first obligatory cast change. 10/19/18 Seen today for follow up and management of left great toe ulcer. Wound continues to show improvement. Noted small open area with seroussang drainage with palpation. Denies any increased pain or recent fevers during visit. She will continue calcium alginate with offloading shoe. Denies any questions or concerns during visit. 10/26/18 on evaluation today patient appears to be doing about the same as when I last saw her in  regard to her wound bed. Fortunately there does not appear to be any signs of infection. Unfortunately she continues to have a breakdown in regard to the toe region any time that she is not in the cast. It takes almost no time at all for this to happen. Nonetheless she still has not heard anything from the brace being made by [employer] as to when exactly this will be available to her. Fortunately there is no signs of infection at this time. 10/30/18 on evaluation today patient presents for application of the total contact cast as we just received him this morning. Fortunately we are gonna be able to apply this to her today which is great news. She continues to have no significant pain which is good news. Overall I do feel like things have been improving while she was the cast is when she doesn't have a cast that things get worse. She still has not really heard anything from [place] regarding her brace. 11/02/18 upon evaluation today patient's wound already appears to be doing significantly better which is good news. Fortunately there does not appear to be any signs of infection also good news. Overall I do think the total contact cast as before is helping to heal this area unfortunately it's just not gonna likely keep the area closed and healed without her getting her brace at least. Again the foot drop is a significant issue for her. 11/09/18 on evaluation today patient appears to be doing excellent in regard to her toe ulcer which in fact is completely healed. Fortunately we finally got the situation squared away with the paperwork which was needed to proceed with getting her brace approved by Medicaid. I have filled that out unfortunately that information has been sent to the orthopedic office that I worked at 2 1/2 years ago and not tired Current wound care measures. Fortunately she seems to be doing very well at this time. 11/23/18 on evaluation today patient appears to be doing More Poorly Compared to  Last Time I Saw Her. At [facility] She Had Completely Healed. Currently she is continuing to have issues with reopening. She states that she just found out that the brace was approved through Medicaid now she just has to go get measured in order to have this fitted for her and then made. Subsequently she does not have an appointment for this yet that is going to complicate things we obviously cannot put her back in the cast if we do not have everything measured because they're not gonna be able to measure her foot while she is in the cast. 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 Obviously this is unfortunate and does have me somewhat worried as well. 11/30/18 on evaluation today patient's toe ulcer actually appears to be doing fairly well. The good news is she will be getting her brace in the shoes next week on Wednesday. Hopefully we will be able to get this to heal without having to go back in the cast however she may need the cast in order to get the wound completely heal and then go from there. Fortunately there's no signs of infection at this time. 12/07/18 on evaluation today patient fortunately did receive her brace and she states she could tell this definitely makes her walk better. With that being said she's been having issues with her toe where she noticed yesterday there was a lot of tissue that was loosing off this appears to be much larger than what it was previous. She also states that her leg has been read putting much across the top of her foot just about the ankle although this seems to be receiving somewhat. The total area is still red and appears to be someone infected as best I can tell. She is previously taken Bactrim and that may be a good option for her today as well. We are gonna see what I wound culture shows as well and I think that this is definitely appropriate. With that  being said outside of the culture I still need to initiate something in the interim and that's what I'm gonna go ahead and select Bactrim is a good option for her. 12/14/18 on evaluation today patient appears to be doing better in regard to her left great toe ulcer as compared to last week's evaluation. There's still some erythema although this is significantly improved which is excellent news. Overall I do believe that she is making good progress is still gonna take some time before she is where I would like her to be from the standpoint of being able to place her back into the total contact cast. Hopefully we will be where we need to be by next week. 12/21/18 on evaluation today patient actually appears to be doing poorly in regard to her toe ulcer. She's been tolerating the dressing changes without complication. Fortunately there's no signs of systemic infection although she does have a lot of drainage from the toe ulcer and this does [name], [name] ([id number]) seem to be causing some issues at this point. She does have erythema on the distal portion of her toe that appears to be likely cellulitis. 12/28/18 on evaluation today patient actually appears to be doing a little better in my pinion in regard to her toe ulcer. With that being said she still does have some evidence of infection at this time and for her culture she  had both E. coli as well as enterococcus as organisms noted on evaluation. For that reason I think that though the Keflex likely has treated the E. coli rather well this has really done nothing for the enterococcus. We are going to have to initiate treatment for this specifically. 01/04/19 on evaluation today patient's toe actually appears to be doing better from the standpoint of infection. She currently would like to see about putting the cash back on I think that this is appropriate as long as she takes care of it and keeps it from getting wet. She is gonna have some drainage we  can definitely pass this up with Drawtex and alginate to try to prevent as much drainage as possible from causing the problems. With that being said I do want to at least try her with the cast between now and Tuesday. If there any issues we can't continue to use it then I will discontinue the use of the cast at that point. 01/08/19 on evaluation today patient actually appears to be doing very well as far as her foot ulcer specifically the great toe on the left is concerned. She did have an area of rubbing on the medial aspect of her left ankle which again is from the cast. Fortunately there's no signs of infection at this point in this appears to be a very slight skin breakdown. The patient tells me she felt it rubbing but didn't think it was that bad. Fortunately there is no signs of active infection at this time which is good news. No fevers, chills, nausea, or vomiting noted at this time. 01/15/19 on evaluation today patient actually appears to be doing well in regard to her toe ulcer. Again as previous she seems to do well and she has the cast on which indicates to me that during the time she doesn't have a cast on she's putting way too much pressure on this region. Obviously I think that's gonna be an issue as with the current national emergency concerning the Covid-19 Virus it has been recommended that we discontinue the use of total contact casting by the chief medical officer of our company, Dr. Simona Huh. The reasoning is that if a patient becomes sick and cannot come into have the cast removed they could not just leave this on for an additional two weeks. Obviously the hospitals also do not want to receive patient's who are sick into the emergency department to potentially contaminate the region and spread the Covid-19 Virus among other sick individuals within the hospital system. Therefore at this point we are suspending the use of total contact cast until the current emergency subsides. This was  all discussed with the patient today as well. 01/22/19 on evaluation today patient's wound on her left great toe appears to be doing slightly worse than previously noted last week. She tells me that she has been on this quite a bit in fact she tells me she's been awake for 38 straight hours. This is due to the fact that she's having to care for grandparents because nobody else will. She has been taking care of them for five the last seven days since I've seen her they both have dementia his is from a stroke and her grandmother's was progressive. Nonetheless she states even her mom who knows her condition and situation has only help two of those days to take care of them she's been taking care of the rest. Fortunately there does not appear to be any signs of active  infection in regard to her toe at this point although obviously it doesn't look as good as it did previous. I think this is directly related to her not taking off the pressure and friction by way of taking things easy. Though I completely understand what's going on. 01/29/19 on evaluation today patient's tools are actually appears to be showing some signs of improvement today compared to last week's evaluation as far as not necessarily the overall size of the wound but the fact that she has some new skin growth in between the two ends of the wound opening. Overall I feel like she has done well she states that she had a family member give her what sounds to be a CAM walker boot which has been helpful as well. 02/05/19 on evaluation today patient's wound bed actually appears to be doing significantly better in regard to her overall appearance of the size of the wound. With that being said she is still having an issue with offloading efficiently enough to get this to close. Apparently there is some signs of infection at this point as well unfortunately. Previously she's done well of Augmentin I really do not see anything that needs to be  culture currently but there theme and cellulitis of the foot that I'm seeing I'm gonna go ahead and place her on an antibiotic today to try to help clear this up. 02/12/2019 on evaluation today patient actually appears to be doing poorly in regard to her overall wound status. She tells me she has been using her offloading shoe but actually comes in today wearing her tennis shoe with the AFO brace. Again as I previously discussed with her this is really not sufficient to allow the area to heal appropriately. Nonetheless she continues to be somewhat noncompliant and I do wonder based on what she has told my nurse in the past as to whether or not a good portion of this noncompliance may be recreational drug and alcohol related. She has had a history of heroin overdose and this was fairly recently in the past couple of months that have been seeing her. Nonetheless overall I feel like her wound looks significantly worse today compared to what it was previous. She still has significant erythema despite the Augmentin I am not sure that this is an appropriate medication for her infection I am also concerned that the infection may have gone down into her bone. 02/19/19 on evaluation today patient actually appears to be doing about the same in regard to her toe ulcer. Unfortunately she continues to show signs of bone exposure and infection at this point. There does not appear to be any evidence of worsening of the infection but I'm also not really sure that it's getting significantly better. She is on the Augmentin which should be sufficient for the Staphylococcus aureus infection that she has at this point. With that being said she may need IV antibiotics to more appropriately treat this. We did have a discussion today about hyperbaric option therapy. 02/28/19 on evaluation today patient actually appears to be doing much worse in regard to the wound on her left great toe as compared to even my previous evaluation  last week. Unfortunately this seems to be training in a pretty poor direction. Her toe was actually now starting to angle laterally and I can actually see the entire joint area of the proximal portion of the digit where is the distal portion of the digit again is no longer even in contact with the  joint line. Unfortunately there's a lot more necrotic tissue around the edge and the toe appears to be showing signs of becoming gangrenous in my pinion. I'm very concerned about were things stand at this point. She did see infectious disease and they are planning to send in a prescription for Sivextro for her and apparently this has been approved. With that being said I don't think she should avoid taking this but at the same time I'm not sure that it's gonna be sufficient to save her toe at this point. 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 03/05/19 on evaluation today patient actually appears to be doing quite well in regard to her toe all things considering. She still has bone exposed but there appears to be much less your thing on overall the appearance of the wound and the toe itself is dramatically improved. She still does have some issues currently obviously with infection she did see vascular as well and there concerned that her blood flow to the toad. For that reason they are setting up for an angiogram next week. 03/14/19 on evaluation today patient appears to be doing very poor in regard to her toe and specifically in regard to the ulceration and the fact that she's starting to notice the toe was leaning even more towards the lateral aspect and the complete joint is visible on the proximal aspect of the joint. Nonetheless she's also noted a significant odor and the tip of the toe is turning  more dark and necrotic appearing. Overall I think she is getting worse not better as far as this is concerned. For that reason I am recommending at this point that she likely needs to be seen for likely amputation. [name], [name] ([id number]) READMISSION 03/19/2021 This is a patient that we cared for in this clinic for a prolonged period of time in 2019 and 2020 with a left foot and left first toe wound. 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 In December 2021 she had an ulcer on her right great toe as well as the fourth and fifth toes. 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 She also had an angiogram at that time and underwent angioplasty of the right anterior tibial artery. In any case she claims that the wound on the right foot is closed I did not look at this today which was probably an oversight although I think that should be done next week. After her surgery she developed a dehiscence but I do not see any follow-up of this. According to Dr. Deborra Medina last review that she was out of the area being cared for by another physician but recently came back to his attention. 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 A culture of this area showed E. coli apparently before she came back to see [name] she was supposed to be receiving antibiotics but she did not really take them. Nor is she offloading this area at all. Finally her last hemoglobin A1c listed in epic was in March 2022 at 14.1 she says things are a lot better since then although I am  not sure. 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 She had this wound at the time. During this admission she had E. coli sepsis a TEE was negative. 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 She is still a heavy cigarette smoker. She has a history of bipolar disorder seizure disorder ABI in our clinic was 1.05 6/1; left midfoot in the setting of a TMA done previously. Round circular wound with a "knuckle" of protruding tissue. The problem is that the knuckle was not attached to any of the surrounding granulation and this probed proximally widely I removed a large portion of this tissue. This wound goes with considerable undermining laterally. I do not feel any bone there was no purulence but this is a deep wound. 6/8; in spite of the debridement I did last week. She arrives with a wound looking exactly the same. A protruding "knuckle" of tissue nonadherent to most of the surrounding tissue. There is considerable depth around this from 6-12 o'clock at 2.7 cm and undermining of 1 cm. This does not look overtly infected and the x-ray I did last week was negative for any osseous abnormalities. We have been using silver collagen 6/15; deep tissue culture I did last week showed moderate staph aureus and moderate Pseudomonas. This will definitely require prolonged antibiotic therapy. The pathology on the protuberant area was negative for malignancy fungus etc. 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 We have been using silver collagen. I am going to be prescribing Levaquin for 2 weeks. Her CT scan of the foot is down for 7/5 6/22; CT scan of the foot on 7 5. She says she has hardware in the left leg from her previous fracture. She is on the Levaquin for the deep tissue culture I did that showed methicillin sensitive staph aureus and Pseudomonas. I gave her a 2-week supply and  she will have another week. She arrives in clinic today with the same protuberant tissue however this is nonadherent to the tissue surrounding it. I am really at a loss to explain this unless there is underlying deep tissue infection 6/29; patient presents for 1 week follow-up. She has been using collagen to the wound bed. She reports taking her antibiotics as prescribed.She has no complaints or issues today. She denies signs of infection. 7/6; patient presents for one week followup. She has been using collagen to the wound bed. She states she is taking Levaquin however at times she is not able to keep it down. She denies signs of infection. 7/13; patient presents for 1 week follow-up. She has been using silver alginate to the wound bed. She still has nausea when taking her antibiotics. She denies signs of infection. 7/20; patient presents for 1 week follow-up. She has been using silver alginate with gentamicin cream to the wound bed. She denies any issues and has no complaints today. She denies signs of infection. 7/27; patient presents for 1 week follow-up. She continues to use silver alginate with gentamicin cream to the wound bed. She reports starting her antibiotics. She has no issues or complaints. Overall she reports stability to the wound. 8/3; patient presents for 1 week follow-up. She has been using silver alginate  with gentamicin cream to the wound bed. She reports completing all antibiotics. She has no issues or complaints today. She denies signs of infection. 8/17; patient presents for 2-week follow-up. He is to use silver alginate to the wound bed. She has no issues or complaints today. She denies signs of infection. She reports her pain has improved in her foot since last clinic visit 8/24; patient presents for 1 week follow-up. She continues to use silver alginate to the wound bed. She has no issues or complaints. She denies signs of infection. Pain is stable. 9/7; patient presents  for follow-up. She missed her last week appointment due to feeling ill. She continues to use silver alginate. She has a new wound to the right lower extremity that is covered in eschar. She states It occurred over the past week and has no idea how it started. She currently denies signs of infection. 9/14; patient presents for follow-up. To the left foot wound she has been using gentamicin cream and silver alginate. To the right lower extremity wound she has been keeping this covered and has not obtain Santyl. 9/21; patient presents for follow-up. She reports using gentamicin cream and silver alginate to the left foot and Santyl to the right lower extremity wound. She has no issues or complaints today. She denies signs of infection. 9/28; patient presents for follow-up. She reports a new wound to her right heel. She states this occurred a few days ago and is progressively gotten worse. She has been trying to clean the area with a Q-tip and Santyl. She reports stability in the other 2 wounds. She has been using gentamicin cream and silver alginate to the left foot and Santyl to the right lower extremity wound. 10/12; patient presents for follow-up. She reports improvement to the wound beds. She is seeing vein and vascular to discuss the potential of a left BKA. She states they are going to do an arteriogram. She continues to use silver alginate with dressing changes to her wounds. 11/2; patient presents for follow-up. She states she has not been doing dressing changes to the wound beds. She states she is not able to offload the areas. She reports chronic pain to her left foot wound. 11/9; patient presents for follow-up. She came in with only socks on. She states she forgot to put on shoes. It is unclear if she is doing any dressing changes. She currently denies systemic signs of infection. 11/16; patient presents for follow-up. She came again only with socks on. She states she does not wear shoes ever.  It is unclear if she does dressing changes. She currently denies systemic signs of infection. 11/23; patient presents for follow-up. She wore her shoes today. It still unclear exactly what dressing she is using for each wound but she did states she obtained Dakin's solution and has been using this to the left foot wound. She currently denies signs of infection. AYSHIA, GRAMLICH ([id number]) 11/30; patient presents for follow-up. She has no issues or complaints today. She currently denies signs of infection. 12/7; patient presents for follow-up. She has no issues or complaints today. She has been using Hydrofera Blue to the right heel wound and Dakin solution to the left foot wound. Her right anterior leg wound is healed. She currently denies signs of infection. 12/14; patient presents for follow-up. She has been using Hydrofera Blue to the right heel and Dakin's to the left foot wounds. She has no issues or complaints today. She denies signs of infection. 12/21;  patient presents for follow-up. She reports using Hydrofera Blue to the right heel and Dakin's to the left foot wound. She denies signs of infection. 12/28; patient presents for follow-up. She continues to use Dakin's to the left foot wound and Hydrofera Blue to the right heel wound. She denies signs of infection. 1/4; patient presents for follow-up. She has no issues or complaints today. She denies signs of infection. 1/11; patient presents for follow-up. It is unclear if she has been dressing these wounds over the past week. She currently denies signs of infection. Objective Constitutional Vitals Time Taken: 10:42 AM, Height: 69 in, Weight: 185 lbs, BMI: 27.3, Temperature: 98.2 F, Pulse: 85 bpm, Respiratory Rate: 18 breaths/min, Blood Pressure: 137/95 mmHg. General Notes: Left foot (Transmetatarsal amputation): Wound to the plantar aspect that has granulation tissue with nonviable tissue and increased pockets of depth. There is  circumferential callus. Right foot: To the heel there is an open wound with Granulation tissue , Nonviable tissue and Circumferential callus. No obvious signs of infection to any wound. Integumentary (Hair, Skin) Wound #7 status is Open. Original cause of wound was Gradually Appeared. The date acquired was: 08/07/2020. The wound has been in treatment 32 weeks. The wound is located on the [place]. The wound measures 4.7cm length x 2cm width x 0.3cm depth; 7.383cm^2 area and 2.215cm^3 volume. There is Fat Layer (Subcutaneous Tissue) exposed. There is no tunneling or undermining noted. There is a large amount of serosanguineous drainage noted. The wound margin is thickened. There is large (67-100%) pink, hyper - granulation within the wound bed. There is a small (1-33%) amount of necrotic tissue within the wound bed including Adherent Slough. General Notes: small darker area noted in inside of wound Wound #9 status is Open. Original cause of wound was Gradually Appeared. The date acquired was: 07/18/2021. The wound has been in treatment 15 weeks. The wound is located on the Right Calcaneus. The wound measures 0.3cm length x 0.5cm width x 0.2cm depth; 0.118cm^2 area and 0.024cm^3 volume. There is no tunneling or undermining noted. There is a medium amount of serosanguineous drainage noted. The wound margin is well defined and not attached to the wound base. There is no granulation within the wound bed. There is a large (67-100%) amount of necrotic tissue within the wound bed including Eschar. 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 No signs of infection on exam. I debrided nonviable tissue. I highly recommended using Dakin's wet-to-dry dressing to the left foot wound and Hydrofera Blue to the right foot wound. Also recommended continuing to aggressively offload the wound beds. Follow- up in 1 week. TAMERA, PINGLEY A. ([id number]) Procedures Wound #7 Pre-procedure diagnosis of Wound #7 is a Diabetic Wound/Ulcer of the Lower Extremity located on the [place] .Severity of Tissue Pre Debridement is: Fat layer exposed. There was a Excisional Skin/Subcutaneous Tissue Debridement with a total area of 9.4 sq cm performed by Heather Shan, MD. With the following instrument(s): Curette to remove Viable and Non-Viable tissue/material. Material removed includes Subcutaneous Tissue and Slough and after achieving pain control using Lidocaine 4% Topical Solution. No specimens were taken. A time out was conducted at 11:00, prior to  the start of the procedure. A Minimum amount of bleeding was controlled with Pressure. The procedure was tolerated well. Post Debridement Measurements: 4.7cm length x 2cm width x 0.3cm depth; 2.215cm^3 volume. Character of Wound/Ulcer Post Debridement is stable. Severity of Tissue Post Debridement is: Fat layer exposed. Post procedure Diagnosis Wound #7: Same as Pre-Procedure Wound #9 Pre-procedure diagnosis of Wound #9 is a Diabetic Wound/Ulcer of the Lower Extremity located on the Right Calcaneus .Severity of Tissue Pre Debridement is: Fat layer exposed. There was a Excisional Skin/Subcutaneous Tissue Debridement with a total area of 0.15 sq cm performed by Heather Shan, MD. With the following instrument(s): Curette to remove Non-Viable tissue/material. Material removed includes Callus, Subcutaneous Tissue, and Slough. No specimens were taken. A time out was conducted at 11:03, prior to the start of the procedure. A Minimum amount of bleeding was controlled with Pressure. The  procedure was tolerated well. Post Debridement Measurements: 1cm length x 1.5cm width x 0.3cm depth; 0.353cm^3 volume. Character of Wound/Ulcer Post Debridement is stable. Severity of Tissue Post Debridement is: Fat layer exposed. Post procedure Diagnosis Wound #9: Same as Pre-Procedure Plan Follow-up Appointments: Return Appointment in 1 week. Bathing/ Shower/ Hygiene: Wash wounds with antibacterial soap and water. Off-Loading: Open toe surgical shoe with peg assist. Turn and reposition every 2 hours Other: - peg shoe right, off-loader left Medications-Please add to medication list.: P.O. Antibiotics The following medication(s) was prescribed: Dakin's Solution miscellaneous 0.125 % solution 1 use for wet to dry dressings starting 11/03/2021 WOUND #7: - Foot Wound Laterality: Plantar, Left, Midline Cleanser: Dakin 16 (oz) 0.25 1 x Per Day/30 Days Discharge Instructions: Use as directed. Topical: Gentamicin 1 x Per Day/30 Days Discharge Instructions: Apply as directed by provider. IN OFFICE ONLY Primary Dressing: Gauze 1 x Per Day/30 Days Discharge Instructions: As directed: moistened with Dakins Solution Secondary Dressing: Kerlix 4.5 x 4.1 (in/yd) 1 x Per Day/30 Days Discharge Instructions: Apply Kerlix 4.5 x 4.1 (in/yd) as instructed Secured With: Kerlix Roll Sterile or Non-Sterile 6-ply 4.5x4 (yd/yd) 1 x Per Day/30 Days Discharge Instructions: Apply Kerlix as directed WOUND #9: - Calcaneus Wound Laterality: Right Cleanser: Soap and Water 1 x Per Day/30 Days Discharge Instructions: Gently cleanse wound with antibacterial soap, rinse and pat dry prior to dressing wounds Topical: Gentamicin 1 x Per Day/30 Days Discharge Instructions: Apply as directed by provider. IN OFFICE ONLY Primary Dressing: Hydrofera Blue Ready Transfer Foam, 2.5x2.5 (in/in) 1 x Per Day/30 Days Discharge Instructions: Apply Hydrofera Blue Ready to wound bed as directed Secondary Dressing: Zetuvit Plus  Silicone Border Dressing 4x4 (in/in) 1 x Per Day/30 Days Secured With: coverlet 1 x Per Day/30 Days 1. In office sharp debridement 2. Dakin's 3. Hydrofera Blue 4. Aggressive offloading 5. Follow-up in 1 week Electronic Signature(s) ALEXANDREA, WESTERGARD ([id number]) Signed: 11/03/2021 11:36:33 AM By: Heather Shan DO Entered By: [name] on 11/03/2021 11:35:29 [name] ([id number]) -------------------------------------------------------------------------------- SuperBill Details Patient Name: Heather Dear A. Date of Service: 11/03/2021 Medical Record Number: [id number] Patient Account Number: [id number] Date of Birth/Sex: [birth year] (46 y.o. F) Treating RN: [name] Primary Care Provider: Raelene [name] Other Clinician: Referring Provider: Raelene [name] Treating Provider/Extender: [name] in Treatment: 32 Diagnosis Coding ICD-10 Codes Code Description [phone] 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ity L97.512 Non-pressure chronic ulcer of other part of right foot with fat layer exposed L97.812 Non-pressure chronic ulcer of other part of right lower leg with fat layer exposed E11.621 Type 2  diabetes mellitus with foot ulcer E11.42 Type 2 diabetes mellitus with diabetic polyneuropathy Facility Procedures CPT4 Code: 53010404 Description: 59136 - DEB SUBQ TISSUE 20 SQ CM/< Modifier: Quantity: 1 CPT4 Code: Description: ICD-10 Diagnosis Description 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 L97.512 Non-pressure chronic ulcer of other part of right foot with fat layer expo E11.621 Type 2 diabetes mellitus with foot  ulcer Modifier: severity sed Quantity: Physician Procedures CPT4 Code: 8599234 Description: 11042 - WC PHYS SUBQ TISS 20 SQ CM Modifier: Quantity: 1 CPT4 Code: Description: ICD-10 Diagnosis Description L97.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 of other part  of left foot with other specified L97.512 Non-pressure chronic ulcer of other part of right foot with fat layer expo E11.621 Type 2 diabetes mellitus with foot  ulcer Modifier: severity sed Quantity: Electronic Signature(s) Signed: 11/03/2021 11:36:33 AM By: Heather Shan DO Entered By: [name] on 11/03/2021 11:35:47

## 2021-11-04 NOTE — Progress Notes (Signed)
[name], [name] ([id number]) Visit Report for 11/03/2021 Arrival Information Details Patient Name: [name], [name] Date of Service: 11/03/2021 10:30 AM Medical Record Number: [id number] Patient Account Number: [id number] Date of Birth/Sex: 1976/04/27 (46 y.o. F) Treating RN: Levora Dredge Primary Care Amneet Cendejas: Raelene Bott Other Clinician: Referring Natalin Bible: Raelene Bott Treating Nikhita Mentzel/Extender: Yaakov Guthrie in Treatment: 29 Visit Information History Since Last Visit Added or deleted any medications: No Patient Arrived: Wheel Chair Any new allergies or adverse reactions: No Arrival Time: 10:36 Had a fall or experienced change in No Accompanied By: mother activities of daily living that may affect Transfer Assistance: EasyPivot Patient Lift risk of falls: Patient Requires Transmission-Based No Hospitalized since last visit: No Precautions: Has Dressing in Place as Prescribed: Yes Patient Has Alerts: Yes Has Footwear/Offloading in Place as Prescribed: Yes Patient Alerts: Patient on Blood Left: Surgical Shoe with Pressure Relief Thinner Insole 57m apirin Diabetic Type II Right: Surgical Shoe with Pressure Relief Insole Pain Present Now: Yes Electronic Signature(s) Signed: 11/04/2021 12:48:18 PM By: GLevora DredgeEntered By: GLevora Dredgeon 11/03/2021 10:40:21 SLonzo [name]([id number] -------------------------------------------------------------------------------- Encounter Discharge Information Details Patient Name: [name] Date of Service: 11/03/2021 10:30 AM Medical Record Number: [id number] Account Number: [id number] of Birth/Sex: 9July 28, 1977(46y.o. F) Treating RN: GLevora DredgePrimary Care Kahlil Cowans: HRaelene BottOther Clinician: Referring Per Beagley: HRaelene BottTreating Shiloh Swopes/Extender: HYaakov Guthriein Treatment: 338Encounter Discharge Information Items Post Procedure Vitals Discharge Condition:  Stable Temperature (F): 98.2 Ambulatory Status: Wheelchair Pulse (bpm): 85 Discharge Destination: Home Respiratory Rate (breaths/min): 18 Transportation: Private Auto Blood Pressure (mmHg): 137/95 Accompanied By: mom Schedule Follow-up Appointment: Yes Clinical Summary of Care: Electronic Signature(s) Signed: 11/03/2021 1:11:48 PM By: GLevora DredgeEntered By: GLevora Dredgeon 11/03/2021 13:11:47 [name], [name] ([id number] -------------------------------------------------------------------------------- Lower Extremity Assessment Details Patient Name: [name] Date of Service: 11/03/2021 10:30 AM Medical Record Number: [id number] Account Number: [id number] of Birth/Sex: [birth year](46y.o. F) Treating RN: GLevora DredgePrimary Care Alysse Rathe: HRaelene BottOther Clinician: Referring Deon Duer: HRaelene BottTreating Aidaly Cordner/Extender: HYaakov Guthriein Treatment: 32 Edema Assessment Assessed: [Left: No] [Right: No] Edema: [Left: Yes] [Right: Yes] Vascular Assessment Pulses: Dorsalis Pedis Palpable: [Left:Yes] [Right:Yes] Posterior Tibial Palpable: [Left:Yes] [Right:Yes] Electronic Signature(s) Signed: 11/04/2021 12:48:18 PM By: GLevora DredgeEntered By: GLevora Dredgeon 11/03/2021 10:54:30 [name], [name] ([id number] -------------------------------------------------------------------------------- Multi Wound Chart Details Patient Name: [name] Date of Service: 11/03/2021 10:30 AM Medical Record Number: [id number] Account Number: [id number] of Birth/Sex: 9Feb 18, 1977(46y.o. F) Treating RN: GLevora DredgePrimary Care Avri Paiva: HRaelene BottOther Clinician: Referring Sriya Kroeze: HRaelene BottTreating Milta Croson/Extender: HYaakov Guthriein Treatment: 32 Vital Signs Height(in): 651Pulse(bpm): 837Weight(lbs): 185 Blood Pressure(mmHg): 137/95 Body Mass Index(BMI): 27 Temperature(F):  98.2 Respiratory Rate(breaths/min): 18 Photos: [N/A:N/A] Wound Location: Left, Midline, Plantar Foot Right Calcaneus N/A Wounding Event: Gradually Appeared Gradually Appeared N/A Primary Etiology: Diabetic Wound/Ulcer of the Lower Diabetic Wound/Ulcer of the Lower N/A Extremity Extremity Comorbid History: Chronic sinus problems/congestion, Chronic sinus problems/congestion, N/A Middle ear problems, Anemia, Middle ear problems, Anemia, Chronic Obstructive Pulmonary Chronic Obstructive Pulmonary Disease (COPD), Congestive Heart Disease (COPD), Congestive Heart Failure, Type II Diabetes, End Stage Failure, Type II Diabetes, End Stage Renal Disease, History of pressure Renal Disease, History of pressure wounds, Neuropathy wounds, Neuropathy Date Acquired: 08/07/2020 07/18/2021 N/A Weeks of Treatment: 32 15 N/A Wound Status: Open Open N/A Measurements L x W x D (cm) 4.7x2x0.3 0.3x0.5x0.2 N/A Area (cm) :  7.383 0.118 N/A Volume (cm) : 2.215 0.024 N/A % Reduction in Area: -176.50% 64.20% N/A % Reduction in Volume: -65.90% 89.60% N/A Classification: Grade 2 Grade 1 N/A Exudate Amount: Large Medium N/A Exudate Type: Serosanguineous Serosanguineous N/A Exudate Color: red, brown red, brown N/A Wound Margin: Thickened Well defined, not attached N/A Granulation Amount: Large (67-100%) None Present (0%) N/A Granulation Quality: Pink, Hyper-granulation N/A N/A Necrotic Amount: Small (1-33%) Large (67-100%) N/A Necrotic Tissue: Adherent Slough Eschar N/A Exposed Structures: Fat Layer (Subcutaneous Tissue): Fascia: No N/A Yes Fat Layer (Subcutaneous Tissue): Fascia: No No Tendon: No Tendon: No Muscle: No Muscle: No Joint: No Joint: No Bone: No Bone: No Epithelialization: None None N/A Assessment Notes: small darker area noted in inside of N/A N/A wound Treatment Notes EMANI, TAUSSIG ([id number]) Electronic Signature(s) Signed: 11/04/2021 12:48:18 PM By: Levora Dredge Entered By:  Levora Dredge on 11/03/2021 10:59:23 [name] ([id number]) -------------------------------------------------------------------------------- [place] Details Patient Name: Heather Dear A. Date of Service: 11/03/2021 10:30 AM Medical Record Number: [id number] Patient Account Number: [id number] Date of Birth/Sex: 05-Sep-1976 (46 y.o. F) Treating RN: Levora Dredge Primary Care Mysha Peeler: Raelene Bott Other Clinician: Referring Jerimey Burridge: Raelene Bott Treating Mellanie Bejarano/Extender: Yaakov Guthrie in Treatment: 49 Active Inactive Necrotic Tissue Nursing Diagnoses: Impaired tissue integrity related to necrotic/devitalized tissue Knowledge deficit related to management of necrotic/devitalized tissue Goals: Necrotic/devitalized tissue will be minimized in the wound bed Date Initiated: 04/07/2021 Target Resolution Date: 10/08/2021 Goal Status: Active Patient/caregiver will verbalize understanding of reason and process for debridement of necrotic tissue Date Initiated: 04/07/2021 Date Inactivated: 09/22/2021 Target Resolution Date: 09/08/2021 Goal Status: Met Interventions: Assess patient pain level pre-, during and post procedure and prior to discharge Provide education on necrotic tissue and debridement process Treatment Activities: Excisional debridement : 04/07/2021 Notes: Electronic Signature(s) Signed: 11/04/2021 12:48:18 PM By: Levora Dredge Entered By: Levora Dredge on 11/03/2021 10:59:10 Heather Dear A. ([id number]) -------------------------------------------------------------------------------- Pain Assessment Details Patient Name: Heather Dear A. Date of Service: 11/03/2021 10:30 AM Medical Record Number: [id number] Patient Account Number: [id number] Date of Birth/Sex: September 24, 1976 (46 y.o. F) Treating RN: Levora Dredge Primary Care Leyton Magoon: Raelene Bott Other Clinician: Referring Janie Strothman: Raelene Bott Treating  Eryc Bodey/Extender: Yaakov Guthrie in Treatment: 7 Active Problems Location of Pain Severity and Description of Pain Patient Has Paino Yes Site Locations Rate the pain. Current Pain Level: 7 Pain Management and Medication Current Pain Management: Notes patient states pain in left foot Electronic Signature(s) Signed: 11/04/2021 12:48:18 PM By: Levora Dredge Entered By: Levora Dredge on 11/03/2021 10:42:44 [name] ([id number]) -------------------------------------------------------------------------------- Patient/Caregiver Education Details Patient Name: Heather Dear A. Date of Service: 11/03/2021 10:30 AM Medical Record Number: [id number] Patient Account Number: [id number] Date of Birth/Gender: Oct 30, 1975 (46 y.o. F) Treating RN: Levora Dredge Primary Care Physician: Raelene Bott Other Clinician: Referring Physician: Raelene Bott Treating Physician/Extender: Yaakov Guthrie in Treatment: 66 Education Assessment Education Provided To: Patient and Caregiver Education Topics Provided Wound Debridement: Handouts: Wound Debridement Methods: Explain/Verbal Responses: State content correctly Wound/Skin Impairment: Handouts: Caring for Your Ulcer Methods: Explain/Verbal Responses: State content correctly Electronic Signature(s) Signed: 11/04/2021 12:48:18 PM By: Levora Dredge Entered By: Levora Dredge on 11/03/2021 13:10:12 [name], [name] ([id number]) -------------------------------------------------------------------------------- Wound Assessment Details Patient Name: Heather Dear A. Date of Service: 11/03/2021 10:30 AM Medical Record Number: [id number] Patient Account Number: [id number] Date of Birth/Sex: 01-01-1976 (46 y.o. F) Treating RN: Levora Dredge Primary Care Thurmon Mizell: Raelene Bott Other Clinician: Referring Carlye Panameno: Raelene Bott Treating Otie Headlee/Extender: Yaakov Guthrie in Treatment: 32 Wound  Status Wound  Number: 7 Primary Diabetic Wound/Ulcer of the Lower Extremity Etiology: Wound Location: Left, Midline, Plantar Foot Wound Open Wounding Event: Gradually Appeared Status: Date Acquired: 08/07/2020 Comorbid Chronic sinus problems/congestion, Middle ear problems, Weeks Of Treatment: 32 History: Anemia, Chronic Obstructive Pulmonary Disease (COPD), Clustered Wound: No Congestive Heart Failure, Type II Diabetes, End Stage Renal Disease, History of pressure wounds, Neuropathy Photos Wound Measurements Length: (cm) 4.7 Width: (cm) 2 Depth: (cm) 0.3 Area: (cm) 7.383 Volume: (cm) 2.215 % Reduction in Area: -176.5% % Reduction in Volume: -65.9% Epithelialization: None Tunneling: No Undermining: No Wound Description Classification: Grade 2 Wound Margin: Thickened Exudate Amount: Large Exudate Type: Serosanguineous Exudate Color: red, brown Foul Odor After Cleansing: No Slough/Fibrino Yes Wound Bed Granulation Amount: Large (67-100%) Exposed Structure Granulation Quality: Pink, Hyper-granulation Fascia Exposed: No Necrotic Amount: Small (1-33%) Fat Layer (Subcutaneous Tissue) Exposed: Yes Necrotic Quality: Adherent Slough Tendon Exposed: No Muscle Exposed: No Joint Exposed: No Bone Exposed: No Assessment Notes small darker area noted in inside of wound Treatment Notes Wound #7 (Foot) Wound Laterality: Plantar, Left, Midline Cleanser Tarman, [name] ([id number]) Dakin 16 (oz) 0.25 Discharge Instruction: Use as directed. Peri-Wound Care Topical Gentamicin Discharge Instruction: Apply as directed by Heather Lattanzio. IN OFFICE ONLY Primary Dressing Gauze Discharge Instruction: As directed: moistened with Dakins Solution Secondary Dressing Kerlix 4.5 x 4.1 (in/yd) Discharge Instruction: Apply Kerlix 4.5 x 4.1 (in/yd) as instructed Secured With [employer] Sterile or Non-Sterile 6-ply 4.5x4 (yd/yd) Discharge Instruction: Apply Kerlix as directed Compression  Wrap Compression Stockings Add-Ons Electronic Signature(s) Signed: 11/04/2021 12:48:18 PM By: Levora Dredge Entered By: Levora Dredge on 11/03/2021 10:52:48 [name] ([id number]) -------------------------------------------------------------------------------- Wound Assessment Details Patient Name: Heather Dear A. Date of Service: 11/03/2021 10:30 AM Medical Record Number: [id number] Patient Account Number: [id number] Date of Birth/Sex: [birth year] (46 y.o. F) Treating RN: Levora Dredge Primary Care Garo Heidelberg: Raelene Bott Other Clinician: Referring Veniamin Kincaid: Raelene Bott Treating Tekelia Kareem/Extender: Yaakov Guthrie in Treatment: 32 Wound Status Wound Number: 9 Primary Diabetic Wound/Ulcer of the Lower Extremity Etiology: Wound Location: Right Calcaneus Wound Open Wounding Event: Gradually Appeared Status: Date Acquired: 07/18/2021 Comorbid Chronic sinus problems/congestion, Middle ear problems, Weeks Of Treatment: 15 History: Anemia, Chronic Obstructive Pulmonary Disease (COPD), Clustered Wound: No Congestive Heart Failure, Type II Diabetes, End Stage Renal Disease, History of pressure wounds, Neuropathy Photos Wound Measurements Length: (cm) 0.3 Width: (cm) 0.5 Depth: (cm) 0.2 Area: (cm) 0.118 Volume: (cm) 0.024 % Reduction in Area: 64.2% % Reduction in Volume: 89.6% Epithelialization: None Tunneling: No Undermining: No Wound Description Classification: Grade 1 Wound Margin: Well defined, not attached Exudate Amount: Medium Exudate Type: Serosanguineous Exudate Color: red, brown Foul Odor After Cleansing: No Slough/Fibrino Yes Wound Bed Granulation Amount: None Present (0%) Exposed Structure Necrotic Amount: Large (67-100%) Fascia Exposed: No Necrotic Quality: Eschar Fat Layer (Subcutaneous Tissue) Exposed: No Tendon Exposed: No Muscle Exposed: No Joint Exposed: No Bone Exposed: No Treatment Notes Wound #9 (Calcaneus) Wound  Laterality: Right Cleanser Soap and Water Discharge Instruction: Gently cleanse wound with antibacterial soap, rinse and pat dry prior to dressing wounds [name], [name] ([id number]) Peri-Wound Care Topical Gentamicin Discharge Instruction: Apply as directed by Heather Oleksy. IN OFFICE ONLY Primary Dressing Hydrofera Blue Ready Transfer Foam, 2.5x2.5 (in/in) Discharge Instruction: Apply Hydrofera Blue Ready to wound bed as directed Secondary Dressing Zetuvit Plus Silicone Border Dressing 4x4 (in/in) Secured With coverlet Compression Wrap Compression Stockings Add-Ons Electronic Signature(s) Signed: 11/04/2021 12:48:18 PM By: Levora Dredge Entered By: Levora Dredge on 11/03/2021 10:53:41 [name], Heather  A. ([id number]) -------------------------------------------------------------------------------- Vitals Details Patient Name: [name], [name] Date of Service: 11/03/2021 10:30 AM Medical Record Number: [id number] Patient Account Number: [id number] Date of Birth/Sex: 1976-06-12 (46 y.o. F) Treating RN: Levora Dredge Primary Care Hiram Mciver: Raelene Bott Other Clinician: Referring Lakshya Mcgillicuddy: Raelene Bott Treating Maite Burlison/Extender: Yaakov Guthrie in Treatment: 32 Vital Signs Time Taken: 10:42 Temperature (F): 98.2 Height (in): 69 Pulse (bpm): 85 Weight (lbs): 185 Respiratory Rate (breaths/min): 18 Body Mass Index (BMI): 27.3 Blood Pressure (mmHg): 137/95 Reference Range: 80 - 120 mg / dl Electronic Signature(s) Signed: 11/04/2021 12:48:18 PM By: Levora Dredge Entered By: Levora Dredge on 11/03/2021 10:42:20

## 2021-11-10 ENCOUNTER — Encounter (HOSPITAL_BASED_OUTPATIENT_CLINIC_OR_DEPARTMENT_OTHER): Payer: Medicaid Other | Admitting: Internal Medicine

## 2021-11-10 ENCOUNTER — Other Ambulatory Visit: Payer: Self-pay

## 2021-11-10 DIAGNOSIS — L97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ity: Secondary | ICD-10-CM

## 2021-11-10 DIAGNOSIS — E11621 Type 2 diabetes mellitus with foot ulcer: Secondary | ICD-10-CM

## 2021-11-10 NOTE — Progress Notes (Signed)
[name], [name] ([id number]) Visit Report for 11/10/2021 Arrival Information Details Patient Name: [name], [name] Date of Service: 11/10/2021 10:30 AM Medical Record Number: [id number] Patient Account Number: [id number] Date of Birth/Sex: [birth year] (46 y.o. F) Treating RN: Levora Dredge Primary Care Jordie Skalsky: Raelene Bott Other Clinician: Referring Arnie Maiolo: Raelene Bott Treating Darnell Jeschke/Extender: Yaakov Guthrie in Treatment: 26 Visit Information History Since Last Visit Added or deleted any medications: No Patient Arrived: Wheel Chair Any new allergies or adverse reactions: No Arrival Time: 10:43 Had a fall or experienced change in No Accompanied By: mother activities of daily living that may affect Transfer Assistance: EasyPivot Patient Lift risk of falls: Patient Identification Verified: Yes Hospitalized since last visit: No Secondary Verification Process Completed: Yes Has Dressing in Place as Prescribed: Yes Patient Requires Transmission-Based No Has Footwear/Offloading in Place as Prescribed: No Precautions: Pain Present Now: Yes Patient Has Alerts: Yes Patient Alerts: Patient on Blood Thinner 45m apirin Diabetic Type II Electronic Signature(s) Signed: 11/10/2021 4:56:07 PM By: GLevora DredgeEntered By: GLevora Dredgeon 11/10/2021 10:48:01 SLonzo Candy([id number] -------------------------------------------------------------------------------- Encounter Discharge Information Details Patient Name: Heather DearA. Date of Service: 11/10/2021 10:30 AM Medical Record Number: [id number] Account Number: [id number] of Birth/Sex: [birth year](46y.o. F) Treating RN: GLevora DredgePrimary Care Shadara Lopez: HRaelene BottOther Clinician: Referring Mickayla Trouten: HRaelene BottTreating Tejas Seawood/Extender: HYaakov Guthriein Treatment: 349Encounter Discharge Information Items Post Procedure Vitals Discharge Condition:  Stable Temperature (F): 98.1 Ambulatory Status: Wheelchair Pulse (bpm): 66 Discharge Destination: Home Respiratory Rate (breaths/min): 18 Transportation: Private Auto Blood Pressure (mmHg): 130/85 Accompanied By: mother Schedule Follow-up Appointment: Yes Clinical Summary of Care: Electronic Signature(s) Signed: 11/10/2021 12:51:22 PM By: GLevora DredgeEntered By: GLevora Dredgeon 11/10/2021 12:51:22 SLonzo Candy([id number] -------------------------------------------------------------------------------- Lower Extremity Assessment Details Patient Name: Heather DearA. Date of Service: 11/10/2021 10:30 AM Medical Record Number: [id number] Account Number: [id number] of Birth/Sex: 912/24/1977(46y.o. F) Treating RN: GLevora DredgePrimary Care Jaamal Farooqui: HRaelene BottOther Clinician: Referring Eoin Willden: HRaelene BottTreating Kamoni Gentles/Extender: HYaakov Guthriein Treatment: 33 Edema Assessment Assessed: [Left: No] [Right: No] [Left: Edema] [Right: :] Calf Left: Right: Point of Measurement: 30 cm From Medial Instep 30 cm 30.4 cm Ankle Left: Right: Point of Measurement: 10 cm From Medial Instep 18.2 cm 17.4 cm Vascular Assessment Pulses: Dorsalis Pedis Palpable: [Left:Yes] [Right:Yes] Posterior Tibial Palpable: [Left:Yes] [Right:Yes] Notes swelling noted to bilat LE but not in feet Electronic Signature(s) Signed: 11/10/2021 4:56:07 PM By: GLevora DredgeEntered By: GLevora Dredgeon 11/10/2021 11:05:40 [name], [name] ([id number] -------------------------------------------------------------------------------- Multi Wound Chart Details Patient Name: Heather DearA. Date of Service: 11/10/2021 10:30 AM Medical Record Number: [id number] Account Number: [id number] of Birth/Sex: [birth year](46y.o. 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 Lower N/A Extremity Extremity Comorbid History: Chronic sinus problems/congestion, Chronic sinus problems/congestion, N/A Middle ear problems, Anemia, Middle ear problems, Anemia, Chronic Obstructive Pulmonary Chronic Obstructive Pulmonary Disease (COPD), Congestive Heart Disease (COPD), Congestive Heart Failure, Type II Diabetes, End Stage Failure, Type II Diabetes, End Stage Renal Disease, History of pressure Renal Disease, History  of pressure wounds, Neuropathy wounds, Neuropathy Date Acquired: 08/07/2020 07/18/2021 N/A Weeks of Treatment: 33 16 N/A Wound Status: Open Open N/A Measurements L x W x D (cm) 3.9x1.6x0.3 0.4x0.6x0.2 N/A Area (cm) : 4.901 0.188 N/A Volume (cm) : 1.47 0.038 N/A % Reduction in Area: -83.60% 43.00% N/A % Reduction in Volume: -10.10% 83.50% N/A Classification: Grade 2 Grade 1 N/A Exudate Amount: Large Medium N/A Exudate Type: Serosanguineous Serosanguineous N/A Exudate Color: red, brown red, brown N/A Wound Margin: Thickened Well defined, not attached N/A Granulation Amount: Large (67-100%) Small (1-33%) N/A Granulation Quality: Pink, Hyper-granulation Pink N/A Necrotic Amount: Small (1-33%) Large (67-100%) N/A Necrotic Tissue: Adherent Shepherd N/A Exposed Structures: Fat Layer (Subcutaneous Tissue): Fascia: No N/A Yes Fat Layer (Subcutaneous Tissue): Fascia: No No Tendon: No Tendon: No Muscle: No Muscle: No Joint: No Joint: No Bone: No Bone:  No Epithelialization: None None N/A Treatment Notes Electronic Signature(s) [name], [name] ([id number]) Signed: 11/10/2021 4:56:07 PM By: Levora Dredge Entered By: Levora Dredge on 11/10/2021 11:32:25 [name] ([id number]) -------------------------------------------------------------------------------- [place] Details Patient Name: Heather Dear A. Date of Service: 11/10/2021 10:30 AM Medical Record Number: [id number] Patient Account Number: [id number] Date of Birth/Sex: 08/01/1976 (46 y.o. F) Treating RN: Levora Dredge Primary Care [place] Weekly: Raelene Bott Other Clinician: Referring Lillis Nuttle: Raelene Bott Treating Winifred Balogh/Extender: Yaakov Guthrie in Treatment: 38 Active Inactive Necrotic Tissue Nursing Diagnoses: Impaired tissue integrity related to necrotic/devitalized tissue Knowledge deficit related to management of necrotic/devitalized tissue Goals: Necrotic/devitalized tissue will be minimized in the wound bed Date Initiated: 04/07/2021 Target Resolution Date: 10/08/2021 Goal Status: Active Patient/caregiver will verbalize understanding of reason and process for debridement of necrotic tissue Date Initiated: 04/07/2021 Date Inactivated: 09/22/2021 Target Resolution Date: 09/08/2021 Goal Status: Met Interventions: Assess patient pain level pre-, during and post procedure and prior to discharge Provide education on necrotic tissue and debridement process Treatment Activities: Excisional debridement : 04/07/2021 Notes: Electronic Signature(s) Signed: 11/10/2021 4:56:07 PM By: Levora Dredge Entered By: Levora Dredge on 11/10/2021 11:32:09 Heather Dear A. ([id number]) -------------------------------------------------------------------------------- Pain Assessment Details Patient Name: Heather Dear A. Date of Service: 11/10/2021 10:30 AM Medical Record Number: [id number] Patient Account Number: [id number] Date of  Birth/Sex: 1976/08/20 (47 y.o. F) Treating RN: Levora Dredge Primary Care Walton Digilio: Raelene Bott Other Clinician: Referring Larena Ohnemus: Raelene Bott Treating Lorenna Lurry/Extender: Yaakov Guthrie in Treatment: 1 Active Problems Location of Pain Severity and Description of Pain Patient Has Paino Yes Site Locations Rate the pain. Current Pain Level: 10 Pain Management and Medication Current Pain Management: Notes patient states in bilateral feet Electronic Signature(s) Signed: 11/10/2021 4:56:07 PM By: Levora Dredge Entered By: Levora Dredge on 11/10/2021 10:51:14 [name] ([id number]) -------------------------------------------------------------------------------- Patient/Caregiver Education Details Patient Name: Heather Dear A. Date of Service: 11/10/2021 10:30 AM Medical Record Number: [id number] Patient Account Number: [id number] Date of Birth/Gender: [birth year] (46 y.o. F) Treating RN: Levora Dredge Primary Care Physician: Raelene Bott Other Clinician: Referring Physician: Raelene Bott Treating Physician/Extender: Yaakov Guthrie in Treatment: 36 Education Assessment Education Provided To: Patient Education Topics Provided Wound Debridement: Handouts: Wound Debridement Methods: Explain/Verbal Responses: State content correctly Wound/Skin Impairment: Handouts: Caring for Your Ulcer Methods: Explain/Verbal Responses: State content correctly Electronic Signature(s) Signed: 11/10/2021 4:56:07 PM By: Levora Dredge Entered By: Levora Dredge on 11/10/2021 12:48:47 [name], [name] ([id number]) -------------------------------------------------------------------------------- Wound Assessment Details Patient Name: Heather Dear A. Date of Service: 11/10/2021 10:30 AM Medical Record Number: [id number] Patient Account Number: [id number] Date of Birth/Sex: [birth year] (46 y.o. F) Treating RN: Araceli Bouche,  [place] Primary Care Cope Marte: Raelene Bott Other Clinician: Referring Moriah Shawley: Raelene Bott Treating Kapono Luhn/Extender: Yaakov Guthrie in Treatment: 33 Wound Status Wound Number: 10 Primary Diabetic Wound/Ulcer of the Lower Extremity Etiology: Wound Location: Right, Midline Lower Leg Wound Open Wounding Event: Gradually Appeared Status: Date Acquired: 11/04/2021 Comorbid Chronic sinus problems/congestion, Middle ear problems, Weeks Of Treatment: 0 History: Anemia, Chronic Obstructive Pulmonary Disease (COPD), Clustered Wound: No Congestive Heart Failure, Type II Diabetes, End Stage Renal Disease, History of pressure wounds, Neuropathy Photos Wound Measurements Length: (cm) 1.1 Width: (cm) 1.2 Depth: (cm) 0.1 Area: (cm) 1.037 Volume: (cm) 0.104 % Reduction in Area: % Reduction in Volume: Epithelialization: Small (1-33%) Tunneling: No Undermining: No Wound Description Classification: Grade 1 Exudate Amount: Medium Exudate Type: Serosanguineous Exudate Color: red, brown Foul Odor After Cleansing: No Slough/Fibrino Yes Wound Bed Granulation Amount: None Present (0%) Necrotic Amount: Large (67-100%) Necrotic Quality: Eschar Treatment Notes Wound #10 (Lower Leg) Wound Laterality: Right, Midline Cleanser Peri-Wound Care Topical Santyl Collagenase Ointment, 30 (gm), tube Discharge Instruction: apply nickel thick to wound bed only Primary Dressing [name], [name] ([id number]) Zetuvit Plus Silicone Border Dressing 4x4 (in/in) Secondary Dressing Secured With Compression Wrap Compression Stockings Add-Ons Electronic Signature(s) Signed: 11/10/2021 4:56:07 PM By: Levora Dredge Entered By: Levora Dredge on 11/10/2021 11:37:32 [name], [name] ([id number]) -------------------------------------------------------------------------------- Wound Assessment Details Patient Name: Heather Dear A. Date of Service: 11/10/2021 10:30 AM Medical Record Number: [id number] Patient Account Number:  [id number] Date of Birth/Sex: 1976-02-23 (46 y.o. F) Treating RN: Levora Dredge Primary Care Jonanthony Nahar: Raelene Bott Other Clinician: Referring Lavon Horn: Raelene Bott Treating Desirae Mancusi/Extender: Yaakov Guthrie in Treatment: 33 Wound Status Wound Number: 7 Primary Diabetic Wound/Ulcer of the Lower Extremity Etiology: Wound Location: Left, Midline, Plantar Foot Wound Open Wounding Event: Gradually Appeared Status: Date Acquired: 08/07/2020 Comorbid Chronic sinus problems/congestion, Middle ear problems, Weeks Of Treatment: 33 History: Anemia, Chronic Obstructive Pulmonary Disease (COPD), Clustered Wound: No Congestive Heart Failure, Type II Diabetes, End Stage Renal Disease, History of pressure wounds, Neuropathy Photos Wound Measurements Length: (cm) 3.9 Width: (cm) 1.6 Depth: (cm) 0.3 Area: (cm) 4.901 Volume: (cm) 1.47 % Reduction in Area: -83.6% % Reduction in Volume: -10.1% Epithelialization: None Tunneling: No Undermining: No Wound Description Classification: Grade 2 Wound Margin: Thickened Exudate Amount: Large Exudate Type: Serosanguineous Exudate Color: red, brown Foul Odor After Cleansing: No Slough/Fibrino Yes Wound Bed Granulation Amount: Large (67-100%) Exposed Structure Granulation Quality: Pink, Hyper-granulation Fascia Exposed: No Necrotic Amount: Small (1-33%) Fat Layer (Subcutaneous Tissue) Exposed: Yes Necrotic Quality: Adherent Slough Tendon Exposed: No Muscle Exposed: No Joint Exposed: No Bone Exposed: No Treatment Notes Wound #7 (Foot) Wound Laterality: Plantar, Left, Midline Cleanser Dakin 16 (oz) 0.25 Discharge Instruction: Use as directed. [name], [name] ([id number]) Peri-Wound Care Topical Primary Dressing Gauze Discharge Instruction: As directed: moistened with Dakins Solution Secondary Dressing Kerlix 4.5 x 4.1 (in/yd) Discharge Instruction: Apply Kerlix 4.5 x 4.1 (in/yd) as instructed Secured With [employer]  Sterile or Non-Sterile 6-ply 4.5x4 (yd/yd) Discharge Instruction: Apply Kerlix as directed Compression Wrap Compression Stockings Add-Ons Electronic Signature(s) Signed: 11/10/2021 4:56:07 PM By: Levora Dredge Entered By: Levora Dredge on 11/10/2021 11:02:37 Heather Dear A. ([id number]) -------------------------------------------------------------------------------- Wound Assessment Details Patient Name: Heather Dear A. Date of Service: 11/10/2021 10:30 AM Medical Record Number: [id number] Patient Account Number: [id number] Date of Birth/Sex: 23-Oct-1976 (46 y.o. F) Treating RN: Levora Dredge Primary Care Mallorey Odonell: Raelene Bott Other Clinician: Referring Hatcher Froning: Raelene Bott Treating Ertha Nabor/Extender: Yaakov Guthrie in Treatment: 52 Wound Status Wound Number: 5  Primary Diabetic Wound/Ulcer of the Lower Extremity Etiology: Wound Location: Right Calcaneus Wound Open Wounding Event: Gradually Appeared Status: Date Acquired: 07/18/2021 Comorbid Chronic sinus problems/congestion, Middle ear problems, Weeks Of Treatment: 16 History: Anemia, Chronic Obstructive Pulmonary Disease (COPD), Clustered Wound: No Congestive Heart Failure, Type II Diabetes, End Stage Renal Disease, History of pressure wounds, Neuropathy Photos Wound Measurements Length: (cm) 0.4 Width: (cm) 0.6 Depth: (cm) 0.2 Area: (cm) 0.188 Volume: (cm) 0.038 % Reduction in Area: 43% % Reduction in Volume: 83.5% Epithelialization: None Tunneling: No Undermining: No Wound Description Classification: Grade 1 Wound Margin: Well defined, not attached Exudate Amount: Medium Exudate Type: Serosanguineous Exudate Color: red, brown Foul Odor After Cleansing: No Slough/Fibrino Yes Wound Bed Granulation Amount: Small (1-33%) Exposed Structure Granulation Quality: Pink Fascia Exposed: No Necrotic Amount: Large (67-100%) Fat Layer (Subcutaneous Tissue) Exposed: No Necrotic Quality: Eschar,  Adherent Slough Tendon Exposed: No Muscle Exposed: No Joint Exposed: No Bone Exposed: No Treatment Notes Wound #9 (Calcaneus) Wound Laterality: Right Cleanser Soap and Water Discharge Instruction: Gently cleanse wound with antibacterial soap, rinse and pat dry prior to dressing wounds [name], [name] ([id number]) Peri-Wound Care Topical Primary Dressing Hydrofera Blue Ready Transfer Foam, 2.5x2.5 (in/in) Discharge Instruction: Apply Hydrofera Blue Ready to wound bed as directed Secondary Dressing Zetuvit Plus Silicone Border Dressing 4x4 (in/in) Secured With coverlet Compression Wrap Compression Stockings Add-Ons Electronic Signature(s) Signed: 11/10/2021 4:56:07 PM By: Levora Dredge Entered By: Levora Dredge on 11/10/2021 11:03:33 [name] ([id number]) -------------------------------------------------------------------------------- Vitals Details Patient Name: Heather Dear A. Date of Service: 11/10/2021 10:30 AM Medical Record Number: [id number] Patient Account Number: [id number] Date of Birth/Sex: May 27, 1976 (46 y.o. F) Treating RN: Levora Dredge Primary Care Iley Deignan: Raelene Bott Other Clinician: Referring Gurnoor Ursua: Raelene Bott Treating Yousra Ivens/Extender: Yaakov Guthrie in Treatment: 64 Vital Signs Time Taken: 10:48 Temperature (F): 98.1 Height (in): 69 Pulse (bpm): 66 Weight (lbs): 185 Respiratory Rate (breaths/min): 18 Body Mass Index (BMI): 27.3 Blood Pressure (mmHg): 130/85 Reference Range: 80 - 120 mg / dl Electronic Signature(s) Signed: 11/10/2021 4:56:07 PM By: Levora Dredge Entered By: Levora Dredge on 11/10/2021 10:50:51

## 2021-11-10 NOTE — Progress Notes (Signed)
ROSELYNN, WHITACRE ([id number]) Visit Report for 11/10/2021 Chief Complaint Document Details Patient Name: [name], [name] Date of Service: 11/10/2021 10:30 AM Medical Record Number: [id number] Patient Account Number: [id number] Date of Birth/Sex: December 23, 1975 (46 y.o. F) Treating RN: Levora Dredge Primary Care Provider: Raelene Bott Other Clinician: Referring Provider: Raelene Bott Treating Provider/Extender: Yaakov Guthrie in Treatment: 65 Information Obtained from: Patient Chief Complaint 03/19/2021; patient referred by Dr. Luana Shu who has been looking after her left foot for quite [name] period of time for review of [name] nonhealing area in the left midfoot Electronic Signature(s) Signed: 11/10/2021 12:08:14 PM By: Kalman Shan DO Entered By: Kalman Shan on 11/10/2021 12:01:18 [name] ([id number]) -------------------------------------------------------------------------------- Debridement Details Patient Name: Heather Dear [name]. Date of Service: 11/10/2021 10:30 AM Medical Record Number: [id number] Patient Account Number: [id number] Date of Birth/Sex: May 29, 1976 (46 y.o. F) Treating RN: Levora Dredge Primary Care Provider: Raelene Bott Other Clinician: Referring Provider: Raelene Bott Treating Provider/Extender: Yaakov Guthrie in Treatment: 33 Debridement Performed for Wound #7 Left,Midline,Plantar Foot Assessment: Performed By: Physician Kalman Shan, MD Debridement Type: Debridement Severity of Tissue Pre Debridement: Fat layer exposed Level of Consciousness (Pre- Awake and Alert procedure): Pre-procedure Verification/Time Out Yes - 11:33 Taken: Total Area Debrided (L x W): 3.9 (cm) x 1.6 (cm) = 6.24 (cm) Tissue and other material Viable, Non-Viable, Callus, Slough, Subcutaneous, Slough debrided: Level: Skin/Subcutaneous Tissue Debridement Description: Excisional Instrument: Curette Bleeding: Minimum Hemostasis Achieved:  Pressure Response to Treatment: Procedure was tolerated well Level of Consciousness (Post- Awake and Alert procedure): Post Debridement Measurements of Total Wound Length: (cm) 3.9 Width: (cm) 1.6 Depth: (cm) 0.3 Volume: (cm) 1.47 Character of Wound/Ulcer Post Debridement: Stable Severity of Tissue Post Debridement: Fat layer exposed Post Procedure Diagnosis Same as Pre-procedure Electronic Signature(s) Signed: 11/10/2021 12:08:14 PM By: Kalman Shan DO Signed: 11/10/2021 4:56:07 PM By: Levora Dredge Entered By: Levora Dredge on 11/10/2021 11:34:36 [name], [name] ([id number]) -------------------------------------------------------------------------------- HPI Details Patient Name: Heather Dear [name]. Date of Service: 11/10/2021 10:30 AM Medical Record Number: [id number] Patient Account Number: [id number] Date of Birth/Sex: 1976-08-08 (46 y.o. F) Treating RN: Levora Dredge Primary Care Provider: Raelene Bott Other Clinician: Referring Provider: Raelene Bott Treating Provider/Extender: Yaakov Guthrie in Treatment: 16 History of Present Illness HPI Description: 01/18/18-She is here for initial evaluation of the left great toe ulcer. She is [name] poor historian in regards to timeframe in detail. She states approximately 4 weeks ago she lacerated her toe on something in the house. She followed up with her primary care who placed her on Bactrim and ultimately [name] second dose of Bactrim prior to coming to wound clinic. She states she has been treating the toe with peroxide, Betadine and [name] Band-Aid. She did not check her blood sugar this morning but checked it yesterday morning it was 327; she is unaware of [name] recent A1c and there are no current records. She saw Dr. she would've orthopedics last week for an old injury to the left ankle, she states he did not see her toe, nor did she bring it to his attention. She smokes approximately 1 pack cigarettes [name] day. Her social situation  is concerning, she arrives this morning with her mother who appears extremely intoxicated/under the influence; her mother was asked to leave the room and be monitored by the patient's grandmother. The patient's aunt then accompanied the patient and the room throughout the rest of the appointment. We had [name] lengthy discussion regarding the deleterious effects of uncontrolled hyperglycemia and smoking as  it relates to wound healing and overall health. She was strongly encouraged to decrease her smoking and get her diabetes under better control. She states she is currently on [name] diet and has cut down her [facility] consumption. The left toe is erythematous, macerated and slightly edematous with malodor present. The edema in her left foot is below her baseline, there is no erythema streaking. We will treat her with Santyl, doxycycline; we have ordered and xray, culture and provided [name] Peg assist surgical shoe and cultured the wound. 01/25/18-She is here in follow-up evaluation for [name] left great toe ulcer and presents with an abscess to her suprapubic area. She states her blood sugars remain elevated, feeling "sick" and if levels are below 250, but she is trying. She has made no attempt to decrease her smoking stating that we "can't take away her food in her cigarettes". She has been compliant with offloading using the PEG assist you. She is using Santyl daily. the culture obtained last week grew staph aureus and Enterococcus faecalis; continues on the doxycycline and Augmentin was added on Monday. The suprapubic area has erythema, no femoral variation, purple discoloration, minimal induration, was accessed with [name] cotton tip applicator with sanguinopurulent drainage, this was cultured, I suspect the current antibiotic treatment will cover and we will not add anything to her current treatment plan. She was advised to go to urgent care or ER with any change in redness, induration or fever. 02/01/18-She is here in  follow-up evaluation for left great toe ulcers and [name] new abdominal abscess from last week. She was able to use packing until earlier this week, where she "forgot it was there". She states she was feeling ill with GI symptoms last week and was not taking her antibiotic. She states her glucose levels have been predominantly less than 200, with occasional levels between 200-250. She thinks this was contributing to her GI symptoms as they have resolved without intervention. There continues to be significant laceration to left toe, otherwise it clinically looks stable/improved. There is now less superficial opening to the lateral aspect of the great toe that was residual blister. We will transition to [facility] to all wounds, she will continue her Augmentin. If there is no change or deterioration next week for reculture. 02/08/18-She is here in follow-up evaluation for left great toe ulcer and abdominal ulcer. There is an improvement in both wounds. She has been wrapping her left toe with coban, not by our direction, which has created an area of discoloration to the medial aspect; she has been advised to NOT use coban secondary to her neuropathy. She states her glucose levels have been high over this last week ranging from 200-350, she continues to smoke. She admits to being less compliant with her offloading shoe. We will continue with same treatment plan and she will follow-up next week. 02/15/18-She is here in follow-up evaluation for left great toe ulcer and abdominal ulcer. The abdominal ulcer is epithelialized. The left great toe ulcer is improved and all injury from last week using the Coban wrap is resolved, the lateral ulcer is healed. She admits to noncompliance with wearing offloading shoe and admits to glucose levels being greater than 300 most of the week. She continues to smoke and expresses no desire to quit. There is one area medially that probes deeper than it has historically, erythema  to the toe and dorsal foot has consistently waxed and waned. There is no overt signs of cellulitis or infection but we will  culture the wound for any occult infection given the new area of depth and erythema. We will hold off on sensitivities for initiation of antibiotic therapy. 02/22/18-She is here in follow up evaluation for left great toe ulcer. There is overall significant improvement in both wound appearance, erythema and edema with changes made last week. She was not initiated on antibiotic therapy. Culture obtained last week showed oxacillin sensitive staph aureus, sensitive to clindamycin. Clindamycin has been called into the pharmacy but she has been instructed to hold off on initiation secondary to overall clinical improvement and her history of antibiotic intolerance. She has been instructed to contact the clinic with any noted changes/deterioration and the wound, erythema, edema and/or pain. She will follow-up next week. She continues to smoke and her glucose levels remain elevated >250; she admits to compliance with offloading shoe 03/01/18 on evaluation today patient appears to be doing fairly well in regard to her left first toe ulcer. She has been tolerating the dressing changes with the [facility] Dressing without complication and overall this has definitely showed signs of improvement according to records as well is what the patient tells me today. I'm very pleased in that regard. She is having no pain today 03/08/18 She is here for follow up evaluation of [name] left great toe ulcer. She remains non-compliant with glucose control and smoking cessation; glucose levels consistently >200. She states that she got new shoe inserts/peg assist. She admits to compliance with offloading. Since my last evaluation there is significant improvement. We will switch to prisma at this time and she will follow up next week. She is noted to be tachycardic at this appointment, heart rate 120s; she has [name]  history of heart rate 70-130 according to our records. She admits to extreme agitation r/t personal issues; she was advised to monitor her heartrate and contact her physician if it does not return to [name] more normal range (<100). She takes cardizem twice daily. 03/15/18-She is here in follow-up evaluation for left great toe ulcer. She remains noncompliant with glucose control and smoking cessation. She admits to compliance with wearing offloading shoe. The ulcer is improved/stable and we will continue with the same treatment plan and she will follow-up next week 03/22/18-She is here for evaluation for left great toe ulcer. There continues to be significant improvement despite recurrent hyperglycemia (over 500 yesterday) and she continues to smoke. She has been compliant with offloading and we will continue with same treatment plan and she will follow-up next week. 03/29/18-She is here for evaluation for left great toe ulcer. Despite continuing to smoke and uncontrolled diabetes she continues to improve. She is compliant with offloading shoe. We will continue with the same treatment plan and she will follow-up next week 04/05/18- She is here in follow up evaluation for [name] left great toe ulcer; she presents with small pustule to left fifth toe (resembles ant bite). She admits to compliance with wearing offloading shoe; continues to smoke or have uncontrolled blood glucose control. There is more callus than usual with evidence of bleeding; she denies known trauma. 04/12/18-She is here for evaluation of left great toe ulcer. Despite noncompliance with glycemic control and smoking she continues to make Gatley, Shaqueena [name]. ([id number]) improvement. She continues to wear offloading shoe. The pustule, that was identified last week, to the left fifth toe is resolved. She will follow-up in 2 weeks 05/03/18-she is seen in follow-up evaluation for [name] left great toe ulcer. She is compliant with offloading, otherwise  noncompliant with  glycemic control and smoking. She has plateaued and there is minimal improvement noted. We will transition to [facility], replaced the insert to her surgical shoe and she will follow-up in one week 05/10/18- She is here in follow up evaluation for [name] left great toe ulcer. It appears stable despite measurement change. We will continue with same treatment plan and follow up next week. 05/24/18-She is seen in follow-up evaluation for [name] left great toe ulcer. She remains compliant with offloading, has made significant improvement in her diet, decreasing the amount of sugar/soda. She said her recent A1c was 10.9 which is lower than. She did see [name] diabetic nutritionist/educator yesterday. She continues to smoke. We will continue with the same treatment plan and she'll follow-up next week. 05/31/18- She is seen in follow-up evaluation for left great toe ulcer. She continues to remain compliant with offloading, continues to make improvement in her diet, increasing her water and decreasing the amount of sugar/soda. She does continue to smoke with no desire to quit. We will apply Prisma to the depth and Hydrofera Blue over. We have not received insurance authorization for oasis. She will follow up next week. 06/07/18-She is seen in follow-up evaluation for left great toe ulcer. It has stalled according to today's measurements although base appears stable. She says she saw [name] diabetic educator yesterday; her average blood sugars are less than 300 which is an improvement for her. She continues to smoke and states "that's my next step" She continues with water over soda. We will order for xray, culture and reinstate ace wrap compression prior to placing apligraf for next week. She is voicing no complaints or concerns. Her dressing will change to iodoflex over the next week in preparation for apligraf. 06/14/18-She is seen in follow-up evaluation for left great toe ulcer. Plain film x-ray performed  last week was negative for osteomyelitis. Wound culture obtained last week grew strep B and OSSA; she is initiated on keflex and cefdinir today; there is erythema to the toe which could be from ace wrap compression, she has [name] history of wrapping too tight and has has been encouraged to maintain ace wraps that we place today. We will hold off on application of apligraf today, will apply next week after antibiotic therapy has been initiated. She admits today that she has resumed taking [name] shower with her foot/toe submerged in water, she has been reminded to keep foot/toe out of the bath water. She will be seen in follow up next week 06/21/18-she is seen in follow-up evaluation for left great toe ulcer. She is tolerating antibiotic therapy with no GI disturbance. The wound is stable. Apligraf was applied today. She has been decreasing her smoking, only had 4 cigarettes yesterday and 1 today. She continues being more compliant in diabetic diet. She will follow-up next week for evaluation of site, if stable will remove at 2 weeks. 06/28/18- She is here in follow up evalution. Apligraf was placed last week, she states the dressing fell off on Tuesday and she was dressing with hydrofera blue. She is healed and will be discharged from the clinic today. She has been instructed to continue with smoking cessation, continue monitoring glucose levels, offloading for an additional 4 weeks and continue with hydrofera blue for additional two weeks for any possible microscopic opening. Readmission: 08/07/18 on evaluation today patient presents for reevaluation concerning the ulcer of her right great toe. She was previously discharged on 06/28/18 healed. Nonetheless she states that this began to show signs of  drainage she subsequently went to her primary care provider. Subsequently an x-ray was performed on 08/01/18 which was negative. The patient was also placed on antibiotics at that time. Fortunately they should have  been effective for the infection. Nonetheless she's been experiencing some improvement but still has [name] lot of drainage coming from the wound itself. 08/14/18 on evaluation today patient's wound actually does show signs of improvement in regard to the erythema at this point. She has completed the antibiotics. With that being said we did discuss the possibility of placing her in [name] total contact cast as of today although I think that I may want to give this just [name] little bit more time to ensure nothing recurrence as far as her infection is concerned. I do not want to put in the cast and risk infection at that time if things are not completely resolved. With that being said she is gonna require some debridement today. 08/21/18 on evaluation today patient actually appears to be doing okay in regard to her toe ulcer. She's been tolerating the dressing changes without complication. With that being said it does appear that she is ready and in fact I think it's appropriate for [place] to go ahead and initiate the total contact cast today. Nonetheless she will require some sharp debridement to prepare the wound for application. Overall I feel like things have been progressing well but we do need to do something to get this to close more readily. 08/24/18 patient seen today for reevaluation after having had the total contact cast applied on Tuesday. She seems to have done very well the wound appears to be doing great and overall I'm pleased with the progress that she's made. There were no abnormal areas of rubbing from the cast on her lower extremity. 08/30/18 on evaluation today patient actually appears to be completely healed in regard to her plantar toe ulcer. She tells me at this point she's been having [name] lot of issues with the cast. She almost fell [name] couple of times the state shall the step of her dog [name] couple times as well. This is been [name] very frustrating process for her other nonetheless she has completely  healed the wound which is excellent news. Overall there does not appear to be the evidence of infection at this time which is great news. 09/11/18 evaluation today patient presents for follow-up concerning her great toe ulcer on the left which has unfortunately reopened since I last saw her which was only [name] couple of weeks ago. Unfortunately she was not able to get in to get the shoe and potentially the AFO that's gonna be necessary due to her left foot drop. She continues with offloading shoe but this is not enough to prevent her from reopening it appears. When we last had her in the total contact cast she did well from [name] healing standpoint but unfortunately the wound reopened as soon as she came out of the cast within just [name] couple of weeks. 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 This is also part of the reason why she falls on [name] regular basis. I just do not believe that is gonna be safe for [place] to reinitiate the total contact cast as last time we had this on she fell 3  times one week which is definitely not normal for her. 09/18/18 upon evaluation today the patient actually appears to be doing about the same in regard to her toe ulcer. She did not contact [facility] as I asked her to even though I had given her the prescription. In fact she actually states that she has no idea where the prescription is. She did apparently call [employer] and they told her that all she needed to do was bring the prescription in order to be able to be seen and work on getting the AFO for her left foot. With all that being said she still does not have an appointment and I'm not sure were things stand that regard. I will give her [name] new prescription today in order to contact them to get this set up. 09/25/18 on evaluation today  patient actually appears to be doing about the same in regard to her toes ulcer. She does have [name] small areas which seems to have [name] lot of callous buildup around the edge of the wound which is going to need sharp debridement today. She still is waiting to be scheduled for evaluation with [employer] for possibility of an AFO. She states there supposed to call her tomorrow to get this set up. Unfortunately it does appear that her foot specifically the toe area is showing signs of erythema. There does not appear to be any systemic infection which is in these good news. [name], [name] ([id number]) 10/02/18 on evaluation today patient actually appears to be doing about the same in regard to her toe ulcer. This really has not done too well although it's not significantly larger it's also not significantly smaller. She has been tolerating the dressing changes without complication. She actually has her appointment with [facility] and [place] tomorrow to hopefully be measured for obtaining and AFO splint. I think this would be helpful preventing this from reoccurring. We had contemplated starting the cast this week although to be honest I am reluctant to do that as she's been having nausea, vomiting, and seizure activity over the past three days. She has [name] history of seizures and have been told is nothing that can be done for these. With that being said I do believe that along with the seizures have the nausea vomiting which upon further questioning doesn't seem to be the normal for her and makes me concerned for the possibility of infection or something else going on. I discussed this with the patient and her mother during the office visit today. I do not feel the wound is effective but maybe something else. The responses this was "this just happens to her at times and we don't know why". They did not seem to be interested in going to the hospital to have this checked out further. 10/09/18 on evaluation today  patient presents for follow-up concerning her ongoing toe ulcer. She has been tolerating the dressing changes without complication. Fortunately there does not appear to be any evidence of infection which is great news however I do think that the patient would benefit from going ahead for with the total contact cast. She's actually in [name] wheelchair today she tells me that she will use her walker if we initiate the cast. I was very specific about the fact that if we were gonna do the cast I wanted to make sure that she was using the walker in order to prevent any falls. She tells me she does not have stairs that she has to traverse on [name]  regular basis at her home. She has not had any seizures since last week again that something that happens to her often she tells me she did talk to [employer] and they said that it may take up to three weeks to get the brace approved for her. Hopefully that will not take that long but nonetheless in the meantime I do think the cast could be of benefit. 10/12/18 on evaluation today patient appears to be doing rather well in regard to her toe ulcer. It's just been [name] few days and already this is significantly improved both as far as overall appearance and size. Fortunately there's no sign of infection. She is here for her first obligatory cast change. 10/19/18 Seen today for follow up and management of left great toe ulcer. Wound continues to show improvement. Noted small open area with seroussang drainage with palpation. Denies any increased pain or recent fevers during visit. She will continue calcium alginate with offloading shoe. Denies any questions or concerns during visit. 10/26/18 on evaluation today patient appears to be doing about the same as when I last saw her in regard to her wound bed. Fortunately there does not appear to be any signs of infection. Unfortunately she continues to have [name] breakdown in regard to the toe region any time that she is not in the cast. It  takes almost no time at all for this to happen. Nonetheless she still has not heard anything from the brace being made by [employer] as to when exactly this will be available to her. Fortunately there is no signs of infection at this time. 10/30/18 on evaluation today patient presents for application of the total contact cast as we just received him this morning. Fortunately we are gonna be able to apply this to her today which is great news. She continues to have no significant pain which is good news. Overall I do feel like things have been improving while she was the cast is when she doesn't have [name] cast that things get worse. She still has not really heard anything from [facility] regarding her brace. 11/02/18 upon evaluation today patient's wound already appears to be doing significantly better which is good news. Fortunately there does not appear to be any signs of infection also good news. Overall I do think the total contact cast as before is helping to heal this area unfortunately it's just not gonna likely keep the area closed and healed without her getting her brace at least. Again the foot drop is [name] significant issue for her. 11/09/18 on evaluation today patient appears to be doing excellent in regard to her toe ulcer which in fact is completely healed. Fortunately we finally got the situation squared away with the paperwork which was needed to proceed with getting her brace approved by Medicaid. I have filled that out unfortunately that information has been sent to the orthopedic office that I worked at 2 1/2 years ago and not tired Current wound care measures. Fortunately she seems to be doing very well at this time. 11/23/18 on evaluation today patient appears to be doing More Poorly Compared to Last Time I Saw Her. At [facility] She Had Completely Healed. Currently she is continuing to have issues with reopening. She states that she just found out that the brace was approved through Medicaid now  she just has to go get measured in order to have this fitted for her and then made. Subsequently she does not have an appointment for this yet that  is going to complicate things we obviously cannot put her back in the cast if we do not have everything measured because they're not gonna be able to measure her foot while she is in the cast. 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 Obviously this is unfortunate and does have me somewhat worried as well. 11/30/18 on evaluation today patient's toe ulcer actually appears to be doing fairly well. The good news is she will be getting her brace in the shoes next week on Wednesday. Hopefully we will be able to get this to heal without having to go back in the cast however she may need the cast in order to get the wound completely heal and then go from there. Fortunately there's no signs of infection at this time. 12/07/18 on evaluation today patient fortunately did receive her brace and she states she could tell this definitely makes her walk better. With that being said she's been having issues with her toe where she noticed yesterday there was [name] lot of tissue that was loosing off this appears to be much larger than what it was previous. She also states that her leg has been read putting much across the top of her foot just about the ankle although this seems to be receiving somewhat. The total area is still red and appears to be someone infected as best I can tell. She is previously taken Bactrim and that may be [name] good option for her today as well. We are gonna see what I wound culture shows as well and I think that this is definitely appropriate. With that being said outside of the culture I still need to initiate something in the interim and that's what I'm gonna go ahead and select Bactrim is [name] good option for her. 12/14/18 on evaluation today patient appears  to be doing better in regard to her left great toe ulcer as compared to last week's evaluation. There's still some erythema although this is significantly improved which is excellent news. Overall I do believe that she is making good progress is still gonna take some time before she is where I would like her to be from the standpoint of being able to place her back into the total contact cast. Hopefully we will be where we need to be by next week. 12/21/18 on evaluation today patient actually appears to be doing poorly in regard to her toe ulcer. She's been tolerating the dressing changes without complication. Fortunately there's no signs of systemic infection although she does have [name] lot of drainage from the toe ulcer and this does seem to be causing some issues at this point. She does have erythema on the distal portion of her toe that appears to be likely cellulitis. 12/28/18 on evaluation today patient actually appears to be doing [name] little better in my pinion in regard to her toe ulcer. With that being said she still does have some evidence of infection at this time and for her culture she had both E. coli as well as enterococcus as organisms noted on evaluation. For that reason I think that though the Keflex likely has treated the E. coli rather well this has really done nothing for the enterococcus. We are going to have to initiate treatment for this specifically. [name], [name] ([id number]) 01/04/19 on evaluation today patient's toe actually appears to be doing better from the standpoint of  infection. She currently would like to see about putting the cash back on I think that this is appropriate as long as she takes care of it and keeps it from getting wet. She is gonna have some drainage we can definitely pass this up with Drawtex and alginate to try to prevent as much drainage as possible from causing the problems. With that being said I do want to at least try her with the cast between now and  Tuesday. If there any issues we can't continue to use it then I will discontinue the use of the cast at that point. 01/08/19 on evaluation today patient actually appears to be doing very well as far as her foot ulcer specifically the great toe on the left is concerned. She did have an area of rubbing on the medial aspect of her left ankle which again is from the cast. Fortunately there's no signs of infection at this point in this appears to be [name] very slight skin breakdown. The patient tells me she felt it rubbing but didn't think it was that bad. Fortunately there is no signs of active infection at this time which is good news. No fevers, chills, nausea, or vomiting noted at this time. 01/15/19 on evaluation today patient actually appears to be doing well in regard to her toe ulcer. Again as previous she seems to do well and she has the cast on which indicates to me that during the time she doesn't have [name] cast on she's putting way too much pressure on this region. Obviously I think that's gonna be an issue as with the current national emergency concerning the Covid-19 Virus it has been recommended that we discontinue the use of total contact casting by the chief medical officer of our company, Dr. Simona Huh. The reasoning is that if [name] patient becomes sick and cannot come into have the cast removed they could not just leave this on for an additional two weeks. Obviously the hospitals also do not want to receive patient's who are sick into the emergency department to potentially contaminate the region and spread the Covid-19 Virus among other sick individuals within the hospital system. Therefore at this point we are suspending the use of total contact cast until the current emergency subsides. This was all discussed with the patient today as well. 01/22/19 on evaluation today patient's wound on her left great toe appears to be doing slightly worse than previously noted last week. She tells me that she has  been on this quite [name] bit in fact she tells me she's been awake for 38 straight hours. This is due to the fact that she's having to care for grandparents because nobody else will. She has been taking care of them for five the last seven days since I've seen her they both have dementia his is from [name] stroke and her grandmother's was progressive. Nonetheless she states even her mom who knows her condition and situation has only help two of those days to take care of them she's been taking care of the rest. Fortunately there does not appear to be any signs of active infection in regard to her toe at this point although obviously it doesn't look as good as it did previous. I think this is directly related to her not taking off the pressure and friction by way of taking things easy. Though I completely understand what's going on. 01/29/19 on evaluation today patient's tools are actually appears to be showing some signs of improvement today  compared to last week's evaluation as far as not necessarily the overall size of the wound but the fact that she has some new skin growth in between the two ends of the wound opening. Overall I feel like she has done well she states that she had [name] family member give her what sounds to be [name] CAM walker boot which has been helpful as well. 02/05/19 on evaluation today patient's wound bed actually appears to be doing significantly better in regard to her overall appearance of the size of the wound. With that being said she is still having an issue with offloading efficiently enough to get this to close. Apparently there is some signs of infection at this point as well unfortunately. Previously she's done well of Augmentin I really do not see anything that needs to be culture currently but there theme and cellulitis of the foot that I'm seeing I'm gonna go ahead and place her on an antibiotic today to try to help clear this up. 02/12/2019 on evaluation today patient actually  appears to be doing poorly in regard to her overall wound status. She tells me she has been using her offloading shoe but actually comes in today wearing her tennis shoe with the AFO brace. Again as I previously discussed with her this is really not sufficient to allow the area to heal appropriately. Nonetheless she continues to be somewhat noncompliant and I do wonder based on what she has told my nurse in the past as to whether or not [name] good portion of this noncompliance may be recreational drug and alcohol related. She has had [name] history of heroin overdose and this was fairly recently in the past couple of months that have been seeing her. Nonetheless overall I feel like her wound looks significantly worse today compared to what it was previous. She still has significant erythema despite the Augmentin I am not sure that this is an appropriate medication for her infection I am also concerned that the infection may have gone down into her bone. 02/19/19 on evaluation today patient actually appears to be doing about the same in regard to her toe ulcer. Unfortunately she continues to show signs of bone exposure and infection at this point. There does not appear to be any evidence of worsening of the infection but I'm also not really sure that it's getting significantly better. She is on the Augmentin which should be sufficient for the Staphylococcus aureus infection that she has at this point. With that being said she may need IV antibiotics to more appropriately treat this. We did have [name] discussion today about hyperbaric option therapy. 02/28/19 on evaluation today patient actually appears to be doing much worse in regard to the wound on her left great toe as compared to even my previous evaluation last week. Unfortunately this seems to be training in [name] pretty poor direction. Her toe was actually now starting to angle laterally and I can actually see the entire joint area of the proximal portion of the  digit where is the distal portion of the digit again is no longer even in contact with the joint line. Unfortunately there's [name] lot more necrotic tissue around the edge and the toe appears to be showing signs of becoming gangrenous in my pinion. I'm very concerned about were things stand at this point. She did see infectious disease and they are planning to send in [name] prescription for Sivextro for her and apparently this has been approved. With that being said I  don't think she should avoid taking this but at the same time I'm not sure that it's gonna be sufficient to save her toe at this point. She tells me that she still having to care for grandparents which I think is putting quite [name] bit of strain on her foot and specifically the total area and has caused this to break down even to [name] greater degree than would've otherwise been expected. 03/05/19 on evaluation today patient actually appears to be doing quite well in regard to her toe all things considering. She still has bone exposed but there appears to be much less your thing on overall the appearance of the wound and the toe itself is dramatically improved. She still does have some issues currently obviously with infection she did see vascular as well and there concerned that her blood flow to the toad. For that reason they are setting up for an angiogram next week. 03/14/19 on evaluation today patient appears to be doing very poor in regard to her toe and specifically in regard to the ulceration and the fact that she's starting to notice the toe was leaning even more towards the lateral aspect and the complete joint is visible on the proximal aspect of the joint. Nonetheless she's also noted [name] significant odor and the tip of the toe is turning more dark and necrotic appearing. Overall I think she is getting worse not better as far as this is concerned. For that reason I am recommending at this point that she likely needs to be seen for  likely amputation. READMISSION 03/19/2021 This is [name] patient that we cared for in this clinic for [name] prolonged period of time in 2019 and 2020 with [name] left foot and left first toe wound. 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 ([id number]) 04/09/20 by Dr. Luana Shu. In December 2021 she had an ulcer on her right great toe as well as the fourth and fifth toes. 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 She also had an angiogram at that time and underwent angioplasty of the right anterior tibial artery. In any case she claims that the wound on the right foot is closed I did not look at this today which was probably an oversight although I think that should be done next week. 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 According to Dr. Deborra Medina last review that she was out of the area being cared for by another physician but recently came back to his attention. 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 [name] culture of this area showed E. coli apparently before she came back to see Dr. Luana Shu she was supposed to be receiving antibiotics but she did not really take them. Nor is she offloading this area at all. Finally her last hemoglobin A1c listed in epic was in March 2022 at 14.1 she says things are [name] lot better since then although I am not sure. 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 She had this wound at the time. During this admission she had E. coli sepsis [name] TEE was negative. 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 She is still [name] heavy cigarette smoker. She has [name] history of bipolar disorder seizure disorder ABI in our clinic was 1.05 6/1; left midfoot in the setting of [name] TMA done previously. Round circular wound with [name] "knuckle" of protruding tissue. The problem is that the knuckle was not attached to any of the surrounding granulation and this probed proximally widely I removed [name] large portion of this tissue. This wound goes with considerable undermining laterally. I do not feel any bone there was no purulence but this is [name] deep wound. 6/8; in spite of the debridement I did last week. She arrives with [name] wound looking exactly the same. [name] protruding "knuckle" of tissue nonadherent to most of the surrounding tissue. There is considerable depth around this from 6-12 o'clock at 2.7 cm and undermining of 1 cm. This does not look overtly infected and the x-ray I did last week was negative for any osseous abnormalities. We have been using silver collagen 6/15; deep tissue culture I did last week showed moderate staph aureus and moderate Pseudomonas. This will definitely require prolonged antibiotic therapy. The pathology on the protuberant area was negative for malignancy fungus etc. 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 We have been using silver collagen. I am going to be prescribing Levaquin for 2 weeks. Her CT scan of the foot is down for 7/5 6/22; CT scan of the foot on 7 5. She says she has hardware in the left leg from her previous fracture. She is on the Levaquin for the deep tissue culture I did that showed methicillin sensitive staph aureus and Pseudomonas. I gave her [name] 2-week supply and she will have another week. She arrives in clinic today with the same protuberant tissue however this is nonadherent to the tissue surrounding it. I am really at [name] loss to explain this unless there is  underlying deep tissue infection 6/29; patient presents for 1 week follow-up. She has been using collagen to the wound bed. She reports taking her antibiotics as prescribed.She has no complaints or issues today. She denies signs of infection. 7/6; patient presents for one week followup. She has been using collagen to the wound bed. She states she is taking Levaquin however at times she is not able to keep it down. She denies signs of infection. 7/13; patient presents for 1 week follow-up. She has been using silver alginate to the wound bed. She still has nausea when taking her antibiotics. She denies signs of infection. 7/20; patient presents for 1 week follow-up. She has been using silver alginate with gentamicin cream to the wound bed. She denies any issues and has no complaints today. She denies signs of infection. 7/27; patient presents for 1 week follow-up. She continues to use silver alginate with gentamicin cream to the wound bed. She reports starting her antibiotics. She has no issues or complaints. Overall she reports stability to the wound. 8/3; patient presents for 1 week follow-up. She has been using silver alginate with gentamicin cream to the wound bed. She reports completing all antibiotics. She has no issues or complaints today. She denies signs of infection. 8/17; patient presents for 2-week follow-up. He is to use silver alginate to the wound bed. She has no issues or complaints today. She denies signs of infection. She reports her pain has improved in her foot since last clinic visit  8/24; patient presents for 1 week follow-up. She continues to use silver alginate to the wound bed. She has no issues or complaints. She denies signs of infection. Pain is stable. 9/7; patient presents for follow-up. She missed her last week appointment due to feeling ill. She continues to use silver alginate. She has [name] new wound to the right lower extremity that is covered in eschar. She states It  occurred over the past week and has no idea how it started. She currently denies signs of infection. 9/14; patient presents for follow-up. To the left foot wound she has been using gentamicin cream and silver alginate. To the right lower extremity wound she has been keeping this covered and has not obtain Santyl. 9/21; patient presents for follow-up. She reports using gentamicin cream and silver alginate to the left foot and Santyl to the right lower extremity wound. She has no issues or complaints today. She denies signs of infection. 9/28; patient presents for follow-up. She reports [name] new wound to her right heel. She states this occurred [name] few days ago and is progressively gotten worse. She has been trying to clean the area with [name] Q-tip and Santyl. She reports stability in the other 2 wounds. She has been using gentamicin cream and silver alginate to the left foot and Santyl to the right lower extremity wound. 10/12; patient presents for follow-up. She reports improvement to the wound beds. She is seeing vein and vascular to discuss the potential of [name] left BKA. She states they are going to do an arteriogram. She continues to use silver alginate with dressing changes to her wounds. 11/2; patient presents for follow-up. She states she has not been doing dressing changes to the wound beds. She states she is not able to offload the areas. She reports chronic pain to her left foot wound. 11/9; patient presents for follow-up. She came in with only socks on. She states she forgot to put on shoes. It is unclear if she is doing any dressing changes. She currently denies systemic signs of infection. 11/16; patient presents for follow-up. She came again only with socks on. She states she does not wear shoes ever. It is unclear if she does dressing changes. She currently denies systemic signs of infection. 11/23; patient presents for follow-up. She wore her shoes today. It still unclear exactly what dressing  she is using for each wound but she did states she obtained Dakin's solution and has been using this to the left foot wound. She currently denies signs of infection. 11/30; patient presents for follow-up. She has no issues or complaints today. She currently denies signs of infection. 12/7; patient presents for follow-up. She has no issues or complaints today. She has been using Hydrofera Blue to the right heel wound and Dakin solution to the left foot wound. Her right anterior leg wound is healed. She currently denies signs of infection. [name], [name] ([id number]) 12/14; patient presents for follow-up. She has been using Hydrofera Blue to the right heel and Dakin's to the left foot wounds. She has no issues or complaints today. She denies signs of infection. 12/21; patient presents for follow-up. She reports using Hydrofera Blue to the right heel and Dakin's to the left foot wound. She denies signs of infection. 12/28; patient presents for follow-up. She continues to use Dakin's to the left foot wound and Hydrofera Blue to the right heel wound. She denies signs of infection. 1/4; patient presents for follow-up. She has no issues or complaints today.  She denies signs of infection. 1/11; patient presents for follow-up. It is unclear if she has been dressing these wounds over the past week. She currently denies signs of infection. 1/18; patient presents for follow-up. She states she has been using Dakin's wet-to-dry dressings to the left foot. She has been using Hydrofera Blue to the right foot foot wound. She states that the anterior right leg wound has reopened and draining serous fluid. She denies signs of infection. Electronic Signature(s) Signed: 11/10/2021 12:08:14 PM By: Kalman Shan DO Entered By: Kalman Shan on 11/10/2021 12:01:57 [name] ([id number]) -------------------------------------------------------------------------------- Physical Exam Details Patient Name:  Heather Dear [name]. Date of Service: 11/10/2021 10:30 AM Medical Record Number: [id number] Patient Account Number: [id number] Date of Birth/Sex: [birth year] (46 y.o. F) Treating RN: Levora Dredge Primary Care Provider: Raelene Bott Other Clinician: Referring Provider: Raelene Bott Treating Provider/Extender: Yaakov Guthrie in Treatment: 52 Constitutional . Cardiovascular . Psychiatric . Notes Left foot (Transmetatarsal amputation): Wound to the plantar aspect that has granulation tissue with nonviable tissue and increased pockets of depth. There is circumferential callus. Right foot: To the heel there is an open wound with Granulation tissue , And scant nonviable tissue withCircumferential callus. No obvious signs of infection to any wound. Right lower extremity: To the anterior aspect there is an open wound with dried nonviable tissue tightly adhered Electronic Signature(s) Signed: 11/10/2021 12:08:14 PM By: Kalman Shan DO Entered By: Kalman Shan on 11/10/2021 12:02:57 [name] ([id number]) -------------------------------------------------------------------------------- Physician Orders Details Patient Name: Heather Dear [name]. Date of Service: 11/10/2021 10:30 AM Medical Record Number: [id number] Patient Account Number: [id number] Date of Birth/Sex: January 15, 1976 (46 y.o. F) Treating RN: Levora Dredge Primary Care Provider: Raelene Bott Other Clinician: Referring Provider: Raelene Bott Treating Provider/Extender: Yaakov Guthrie in Treatment: 80 Verbal / Phone Orders: No Diagnosis Coding Follow-up Appointments o Return Appointment in 1 week. Bathing/ Shower/ Hygiene o Wash wounds with antibacterial soap and water. Off-Loading Left Lower Extremity o Open toe surgical shoe with peg assist. o Turn and reposition every 2 hours o Other: - peg shoe right, off-loader left Medications-Please add to medication list. o P.O.  Antibiotics Wound Treatment Wound #10 - Lower Leg Wound Laterality: Right, Midline Topical: Santyl Collagenase Ointment, 30 (gm), tube 1 x Per Day/30 Days Discharge Instructions: apply nickel thick to wound bed only Primary Dressing: Zetuvit Plus Silicone Border Dressing 4x4 (in/in) 1 x Per Day/30 Days Wound #7 - Foot Wound Laterality: Plantar, Left, Midline Cleanser: Dakin 16 (oz) 0.25 1 x Per Day/30 Days Discharge Instructions: Use as directed. Primary Dressing: Gauze 1 x Per Day/30 Days Discharge Instructions: As directed: moistened with Dakins Solution Secondary Dressing: Kerlix 4.5 x 4.1 (in/yd) 1 x Per Day/30 Days Discharge Instructions: Apply Kerlix 4.5 x 4.1 (in/yd) as instructed Secured With: Kerlix Roll Sterile or Non-Sterile 6-ply 4.5x4 (yd/yd) 1 x Per Day/30 Days Discharge Instructions: Apply Kerlix as directed Wound #9 - Calcaneus Wound Laterality: Right Cleanser: Soap and Water 1 x Per Day/30 Days Discharge Instructions: Gently cleanse wound with antibacterial soap, rinse and pat dry prior to dressing wounds Primary Dressing: Hydrofera Blue Ready Transfer Foam, 2.5x2.5 (in/in) 1 x Per Day/30 Days Discharge Instructions: Apply Hydrofera Blue Ready to wound bed as directed Secondary Dressing: Zetuvit Plus Silicone Border Dressing 4x4 (in/in) 1 x Per Day/30 Days Secured With: coverlet 1 x Per Day/30 Days Electronic Signature(s) Signed: 11/10/2021 2:13:21 PM By: Kalman Shan DO Signed: 11/10/2021 4:56:07 PM By: Levora Dredge Previous Signature: 11/10/2021 12:08:14 PM Version By:  Kalman Shan DO Entered By: Levora Dredge on 11/10/2021 12:50:00 [name] ([id number] [name], [name] Kitchen ([id number]) -------------------------------------------------------------------------------- Problem List Details Patient Name: OTILA, STARN [name]. Date of Service: 11/10/2021 10:30 AM Medical Record Number: [id number] Patient Account Number: [id number] Date of Birth/Sex: [birth year]  (46 y.o. 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 03/19/2021 No Yes severity L97.512 Non-pressure chronic ulcer of other part of right foot with fat layer 09/15/2021 No Yes exposed L97.812 Non-pressure chronic ulcer of other part of right lower leg with fat layer 09/15/2021 No Yes exposed E11.621 Type 2 diabetes mellitus with foot ulcer 03/19/2021 No Yes E11.42 Type 2 diabetes mellitus with diabetic polyneuropathy 03/19/2021 No Yes L97.811 Non-pressure chronic ulcer of other part of right lower leg limited to 11/10/2021 No Yes breakdown of skin Inactive Problems ICD-10 Code Description Active Date Inactive Date S81.801A Unspecified open wound, right lower leg, initial encounter 06/30/2021 06/30/2021 Resolved Problems Electronic Signature(s) Signed: 11/10/2021 12:08:14 PM By: Kalman Shan DO Entered By: Kalman Shan on 11/10/2021 12:01:12 [name] ([id number]) -------------------------------------------------------------------------------- Progress Note Details Patient Name: Heather Dear [name]. Date of Service: 11/10/2021 10:30 AM Medical Record Number: [id number] Patient Account Number: [id number] Date of Birth/Sex: 10-13-1976 (46 y.o. F) Treating RN: Levora Dredge Primary Care Provider: Raelene Bott Other Clinician: Referring Provider: Raelene Bott Treating Provider/Extender: Yaakov Guthrie in Treatment: 84 Subjective Chief Complaint Information obtained from Patient 03/19/2021; patient referred by Dr. Luana Shu who has been looking after her left foot for quite [name] period of time for review of [name] nonhealing area in the left midfoot History of Present Illness  (HPI) 01/18/18-She is here for initial evaluation of the left great toe ulcer. She is [name] poor historian in regards to timeframe in detail. She states approximately 4 weeks ago she lacerated her toe on something in the house. She followed up with her primary care who placed her on Bactrim and ultimately [name] second dose of Bactrim prior to coming to wound clinic. She states she has been treating the toe with peroxide, Betadine and [name] Band-Aid. She did not check her blood sugar this morning but checked it yesterday morning it was 327; she is unaware of [name] recent A1c and there are no current records. She saw Dr. she would've orthopedics last week for an old injury to the left ankle, she states he did not see her toe, nor did she bring it to his attention. She smokes approximately 1 pack cigarettes [name] day. Her social situation is concerning, she arrives this morning with her mother who appears extremely intoxicated/under the influence; her mother was asked to leave the room and be monitored by the patient's grandmother. The patient's aunt then accompanied the patient and the room throughout the rest of the appointment. We had [name] lengthy discussion regarding the deleterious effects of uncontrolled hyperglycemia and smoking as it relates to wound healing and overall health. She was strongly encouraged to decrease her smoking and get her diabetes under better control. She states she is currently on [name] diet and has cut down her [facility] consumption. The left toe is erythematous, macerated and slightly edematous with malodor present. The edema in her left foot is below her baseline, there is no erythema streaking. We will treat her with Santyl, doxycycline; we have  ordered and xray, culture and provided [name] Peg assist surgical shoe and cultured the wound. 01/25/18-She is here in follow-up evaluation for [name] left great toe ulcer and presents with an abscess to her suprapubic area. She states her blood sugars remain  elevated, feeling "sick" and if levels are below 250, but she is trying. She has made no attempt to decrease her smoking stating that we "can't take away her food in her cigarettes". She has been compliant with offloading using the PEG assist you. She is using Santyl daily. the culture obtained last week grew staph aureus and Enterococcus faecalis; continues on the doxycycline and Augmentin was added on Monday. The suprapubic area has erythema, no femoral variation, purple discoloration, minimal induration, was accessed with [name] cotton tip applicator with sanguinopurulent drainage, this was cultured, I suspect the current antibiotic treatment will cover and we will not add anything to her current treatment plan. She was advised to go to urgent care or ER with any change in redness, induration or fever. 02/01/18-She is here in follow-up evaluation for left great toe ulcers and [name] new abdominal abscess from last week. She was able to use packing until earlier this week, where she "forgot it was there". She states she was feeling ill with GI symptoms last week and was not taking her antibiotic. She states her glucose levels have been predominantly less than 200, with occasional levels between 200-250. She thinks this was contributing to her GI symptoms as they have resolved without intervention. There continues to be significant laceration to left toe, otherwise it clinically looks stable/improved. There is now less superficial opening to the lateral aspect of the great toe that was residual blister. We will transition to [facility] to all wounds, she will continue her Augmentin. If there is no change or deterioration next week for reculture. 02/08/18-She is here in follow-up evaluation for left great toe ulcer and abdominal ulcer. There is an improvement in both wounds. She has been wrapping her left toe with coban, not by our direction, which has created an area of discoloration to the medial aspect; she  has been advised to NOT use coban secondary to her neuropathy. She states her glucose levels have been high over this last week ranging from 200-350, she continues to smoke. She admits to being less compliant with her offloading shoe. We will continue with same treatment plan and she will follow-up next week. 02/15/18-She is here in follow-up evaluation for left great toe ulcer and abdominal ulcer. The abdominal ulcer is epithelialized. The left great toe ulcer is improved and all injury from last week using the Coban wrap is resolved, the lateral ulcer is healed. She admits to noncompliance with wearing offloading shoe and admits to glucose levels being greater than 300 most of the week. She continues to smoke and expresses no desire to quit. There is one area medially that probes deeper than it has historically, erythema to the toe and dorsal foot has consistently waxed and waned. There is no overt signs of cellulitis or infection but we will culture the wound for any occult infection given the new area of depth and erythema. We will hold off on sensitivities for initiation of antibiotic therapy. 02/22/18-She is here in follow up evaluation for left great toe ulcer. There is overall significant improvement in both wound appearance, erythema and edema with changes made last week. She was not initiated on antibiotic therapy. Culture obtained last week showed oxacillin sensitive staph aureus, sensitive to clindamycin. Clindamycin  has been called into the pharmacy but she has been instructed to hold off on initiation secondary to overall clinical improvement and her history of antibiotic intolerance. She has been instructed to contact the clinic with any noted changes/deterioration and the wound, erythema, edema and/or pain. She will follow-up next week. She continues to smoke and her glucose levels remain elevated >250; she admits to compliance with offloading shoe 03/01/18 on evaluation today patient  appears to be doing fairly well in regard to her left first toe ulcer. She has been tolerating the dressing changes with the [facility] Dressing without complication and overall this has definitely showed signs of improvement according to records as well is what the patient tells me today. I'm very pleased in that regard. She is having no pain today 03/08/18 She is here for follow up evaluation of [name] left great toe ulcer. She remains non-compliant with glucose control and smoking cessation; glucose levels consistently >200. She states that she got new shoe inserts/peg assist. She admits to compliance with offloading. Since my last evaluation there is significant improvement. We will switch to prisma at this time and she will follow up next week. She is noted to be tachycardic at this appointment, heart rate 120s; she has [name] history of heart rate 70-130 according to our records. She admits to extreme agitation r/t personal issues; she was advised to monitor her heartrate and contact her physician if it does not return to [name] more normal range (<100). She takes cardizem twice daily. 03/15/18-She is here in follow-up evaluation for left great toe ulcer. She remains noncompliant with glucose control and smoking cessation. She admits to compliance with wearing offloading shoe. The ulcer is improved/stable and we will continue with the same treatment plan and she will follow-up next week 03/22/18-She is here for evaluation for left great toe ulcer. There continues to be significant improvement despite recurrent hyperglycemia (over 500 yesterday) and she continues to smoke. She has been compliant with offloading and we will continue with same treatment plan and she will follow-up next week. Prochnow, Erendida [name]. ([id number]) 03/29/18-She is here for evaluation for left great toe ulcer. Despite continuing to smoke and uncontrolled diabetes she continues to improve. She is compliant with offloading shoe. We will  continue with the same treatment plan and she will follow-up next week 04/05/18- She is here in follow up evaluation for [name] left great toe ulcer; she presents with small pustule to left fifth toe (resembles ant bite). She admits to compliance with wearing offloading shoe; continues to smoke or have uncontrolled blood glucose control. There is more callus than usual with evidence of bleeding; she denies known trauma. 04/12/18-She is here for evaluation of left great toe ulcer. Despite noncompliance with glycemic control and smoking she continues to make improvement. She continues to wear offloading shoe. The pustule, that was identified last week, to the left fifth toe is resolved. She will follow-up in 2 weeks 05/03/18-she is seen in follow-up evaluation for [name] left great toe ulcer. She is compliant with offloading, otherwise noncompliant with glycemic control and smoking. She has plateaued and there is minimal improvement noted. We will transition to [facility], replaced the insert to her surgical shoe and she will follow-up in one week 05/10/18- She is here in follow up evaluation for [name] left great toe ulcer. It appears stable despite measurement change. We will continue with same treatment plan and follow up next week. 05/24/18-She is seen in follow-up evaluation for [name] left great  toe ulcer. She remains compliant with offloading, has made significant improvement in her diet, decreasing the amount of sugar/soda. She said her recent A1c was 10.9 which is lower than. She did see [name] diabetic nutritionist/educator yesterday. She continues to smoke. We will continue with the same treatment plan and she'll follow-up next week. 05/31/18- She is seen in follow-up evaluation for left great toe ulcer. She continues to remain compliant with offloading, continues to make improvement in her diet, increasing her water and decreasing the amount of sugar/soda. She does continue to smoke with no desire to quit. We will  apply Prisma to the depth and Hydrofera Blue over. We have not received insurance authorization for oasis. She will follow up next week. 06/07/18-She is seen in follow-up evaluation for left great toe ulcer. It has stalled according to today's measurements although base appears stable. She says she saw [name] diabetic educator yesterday; her average blood sugars are less than 300 which is an improvement for her. She continues to smoke and states "that's my next step" She continues with water over soda. We will order for xray, culture and reinstate ace wrap compression prior to placing apligraf for next week. She is voicing no complaints or concerns. Her dressing will change to iodoflex over the next week in preparation for apligraf. 06/14/18-She is seen in follow-up evaluation for left great toe ulcer. Plain film x-ray performed last week was negative for osteomyelitis. Wound culture obtained last week grew strep B and OSSA; she is initiated on keflex and cefdinir today; there is erythema to the toe which could be from ace wrap compression, she has [name] history of wrapping too tight and has has been encouraged to maintain ace wraps that we place today. We will hold off on application of apligraf today, will apply next week after antibiotic therapy has been initiated. She admits today that she has resumed taking [name] shower with her foot/toe submerged in water, she has been reminded to keep foot/toe out of the bath water. She will be seen in follow up next week 06/21/18-she is seen in follow-up evaluation for left great toe ulcer. She is tolerating antibiotic therapy with no GI disturbance. The wound is stable. Apligraf was applied today. She has been decreasing her smoking, only had 4 cigarettes yesterday and 1 today. She continues being more compliant in diabetic diet. She will follow-up next week for evaluation of site, if stable will remove at 2 weeks. 06/28/18- She is here in follow up evalution. Apligraf was  placed last week, she states the dressing fell off on Tuesday and she was dressing with hydrofera blue. She is healed and will be discharged from the clinic today. She has been instructed to continue with smoking cessation, continue monitoring glucose levels, offloading for an additional 4 weeks and continue with hydrofera blue for additional two weeks for any possible microscopic opening. Readmission: 08/07/18 on evaluation today patient presents for reevaluation concerning the ulcer of her right great toe. She was previously discharged on 06/28/18 healed. Nonetheless she states that this began to show signs of drainage she subsequently went to her primary care provider. Subsequently an x-ray was performed on 08/01/18 which was negative. The patient was also placed on antibiotics at that time. Fortunately they should have been effective for the infection. Nonetheless she's been experiencing some improvement but still has [name] lot of drainage coming from the wound itself. 08/14/18 on evaluation today patient's wound actually does show signs of improvement in regard to the erythema at  this point. She has completed the antibiotics. With that being said we did discuss the possibility of placing her in [name] total contact cast as of today although I think that I may want to give this just [name] little bit more time to ensure nothing recurrence as far as her infection is concerned. I do not want to put in the cast and risk infection at that time if things are not completely resolved. With that being said she is gonna require some debridement today. 08/21/18 on evaluation today patient actually appears to be doing okay in regard to her toe ulcer. She's been tolerating the dressing changes without complication. With that being said it does appear that she is ready and in fact I think it's appropriate for [place] to go ahead and initiate the total contact cast today. Nonetheless she will require some sharp debridement to  prepare the wound for application. Overall I feel like things have been progressing well but we do need to do something to get this to close more readily. 08/24/18 patient seen today for reevaluation after having had the total contact cast applied on Tuesday. She seems to have done very well the wound appears to be doing great and overall I'm pleased with the progress that she's made. There were no abnormal areas of rubbing from the cast on her lower extremity. 08/30/18 on evaluation today patient actually appears to be completely healed in regard to her plantar toe ulcer. She tells me at this point she's been having [name] lot of issues with the cast. She almost fell [name] couple of times the state shall the step of her dog [name] couple times as well. This is been [name] very frustrating process for her other nonetheless she has completely healed the wound which is excellent news. Overall there does not appear to be the evidence of infection at this time which is great news. 09/11/18 evaluation today patient presents for follow-up concerning her great toe ulcer on the left which has unfortunately reopened since I last saw her which was only [name] couple of weeks ago. Unfortunately she was not able to get in to get the shoe and potentially the AFO that's gonna be necessary due to her left foot drop. She continues with offloading shoe but this is not enough to prevent her from reopening it appears. When we last had her in the total contact cast she did well from [name] healing standpoint but unfortunately the wound reopened as soon as she came out of the cast within just [name] couple of weeks. 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 This is also part of the reason why she falls on [name] regular basis. I just do not believe that is gonna be  safe for [place] to reinitiate the total contact cast as last time we had this on she fell 3 times one week which is definitely not normal for her. 09/18/18 upon evaluation today the patient actually appears to be doing about the same in regard to her toe ulcer. She did not contact [facility] as I asked her to even though I had given her the prescription. In fact she actually states that she has no idea where the prescription is. She did apparently call [facility] and they told her that all she  needed to do was bring the prescription in order to be able to be seen and work on getting the AFO for her left foot. With all that being said she still does not have an appointment and I'm not sure were things stand that regard. I will give her [name] new prescription today in order to contact them to get this set up. [name], [name] ([id number]) 09/25/18 on evaluation today patient actually appears to be doing about the same in regard to her toes ulcer. She does have [name] small areas which seems to have [name] lot of callous buildup around the edge of the wound which is going to need sharp debridement today. She still is waiting to be scheduled for evaluation with [facility] for possibility of an AFO. She states there supposed to call her tomorrow to get this set up. Unfortunately it does appear that her foot specifically the toe area is showing signs of erythema. There does not appear to be any systemic infection which is in these good news. 10/02/18 on evaluation today patient actually appears to be doing about the same in regard to her toe ulcer. This really has not done too well although it's not significantly larger it's also not significantly smaller. She has been tolerating the dressing changes without complication. She actually has her appointment with [facility] and [place] tomorrow to hopefully be measured for obtaining and AFO splint. I think this would be helpful preventing this from reoccurring. We had contemplated starting  the cast this week although to be honest I am reluctant to do that as she's been having nausea, vomiting, and seizure activity over the past three days. She has [name] history of seizures and have been told is nothing that can be done for these. With that being said I do believe that along with the seizures have the nausea vomiting which upon further questioning doesn't seem to be the normal for her and makes me concerned for the possibility of infection or something else going on. I discussed this with the patient and her mother during the office visit today. I do not feel the wound is effective but maybe something else. The responses this was "this just happens to her at times and we don't know why". They did not seem to be interested in going to the hospital to have this checked out further. 10/09/18 on evaluation today patient presents for follow-up concerning her ongoing toe ulcer. She has been tolerating the dressing changes without complication. Fortunately there does not appear to be any evidence of infection which is great news however I do think that the patient would benefit from going ahead for with the total contact cast. She's actually in [name] wheelchair today she tells me that she will use her walker if we initiate the cast. I was very specific about the fact that if we were gonna do the cast I wanted to make sure that she was using the walker in order to prevent any falls. She tells me she does not have stairs that she has to traverse on [name] regular basis at her home. She has not had any seizures since last week again that something that happens to her often she tells me she did talk to [employer] and they said that it may take up to three weeks to get the brace approved for her. Hopefully that will not take that long but nonetheless in the meantime I do think the cast could be of benefit. 10/12/18 on evaluation today patient appears  to be doing rather well in regard to her toe ulcer. It's just  been [name] few days and already this is significantly improved both as far as overall appearance and size. Fortunately there's no sign of infection. She is here for her first obligatory cast change. 10/19/18 Seen today for follow up and management of left great toe ulcer. Wound continues to show improvement. Noted small open area with seroussang drainage with palpation. Denies any increased pain or recent fevers during visit. She will continue calcium alginate with offloading shoe. Denies any questions or concerns during visit. 10/26/18 on evaluation today patient appears to be doing about the same as when I last saw her in regard to her wound bed. Fortunately there does not appear to be any signs of infection. Unfortunately she continues to have [name] breakdown in regard to the toe region any time that she is not in the cast. It takes almost no time at all for this to happen. Nonetheless she still has not heard anything from the brace being made by [employer] as to when exactly this will be available to her. Fortunately there is no signs of infection at this time. 10/30/18 on evaluation today patient presents for application of the total contact cast as we just received him this morning. Fortunately we are gonna be able to apply this to her today which is great news. She continues to have no significant pain which is good news. Overall I do feel like things have been improving while she was the cast is when she doesn't have [name] cast that things get worse. She still has not really heard anything from [place] regarding her brace. 11/02/18 upon evaluation today patient's wound already appears to be doing significantly better which is good news. Fortunately there does not appear to be any signs of infection also good news. Overall I do think the total contact cast as before is helping to heal this area unfortunately it's just not gonna likely keep the area closed and healed without her getting her brace at least. Again  the foot drop is [name] significant issue for her. 11/09/18 on evaluation today patient appears to be doing excellent in regard to her toe ulcer which in fact is completely healed. Fortunately we finally got the situation squared away with the paperwork which was needed to proceed with getting her brace approved by Medicaid. I have filled that out unfortunately that information has been sent to the orthopedic office that I worked at 2 1/2 years ago and not tired Current wound care measures. Fortunately she seems to be doing very well at this time. 11/23/18 on evaluation today patient appears to be doing More Poorly Compared to Last Time I Saw Her. At [facility] She Had Completely Healed. Currently she is continuing to have issues with reopening. She states that she just found out that the brace was approved through Medicaid now she just has to go get measured in order to have this fitted for her and then made. Subsequently she does not have an appointment for this yet that is going to complicate things we obviously cannot put her back in the cast if we do not have everything measured because they're not gonna be able to measure her foot while she is in the cast. 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 Obviously this is unfortunate and does have me somewhat worried as  well. 11/30/18 on evaluation today patient's toe ulcer actually appears to be doing fairly well. The good news is she will be getting her brace in the shoes next week on Wednesday. Hopefully we will be able to get this to heal without having to go back in the cast however she may need the cast in order to get the wound completely heal and then go from there. Fortunately there's no signs of infection at this time. 12/07/18 on evaluation today patient fortunately did receive her brace and she states she could tell this definitely makes her walk  better. With that being said she's been having issues with her toe where she noticed yesterday there was [name] lot of tissue that was loosing off this appears to be much larger than what it was previous. She also states that her leg has been read putting much across the top of her foot just about the ankle although this seems to be receiving somewhat. The total area is still red and appears to be someone infected as best I can tell. She is previously taken Bactrim and that may be [name] good option for her today as well. We are gonna see what I wound culture shows as well and I think that this is definitely appropriate. With that being said outside of the culture I still need to initiate something in the interim and that's what I'm gonna go ahead and select Bactrim is [name] good option for her. 12/14/18 on evaluation today patient appears to be doing better in regard to her left great toe ulcer as compared to last week's evaluation. There's still some erythema although this is significantly improved which is excellent news. Overall I do believe that she is making good progress is still gonna take some time before she is where I would like her to be from the standpoint of being able to place her back into the total contact cast. Hopefully we will be where we need to be by next week. 12/21/18 on evaluation today patient actually appears to be doing poorly in regard to her toe ulcer. She's been tolerating the dressing changes without complication. Fortunately there's no signs of systemic infection although she does have [name] lot of drainage from the toe ulcer and this does [name], [name] ([id number]) seem to be causing some issues at this point. She does have erythema on the distal portion of her toe that appears to be likely cellulitis. 12/28/18 on evaluation today patient actually appears to be doing [name] little better in my pinion in regard to her toe ulcer. With that being said she still does have some evidence of  infection at this time and for her culture she had both E. coli as well as enterococcus as organisms noted on evaluation. For that reason I think that though the Keflex likely has treated the E. coli rather well this has really done nothing for the enterococcus. We are going to have to initiate treatment for this specifically. 01/04/19 on evaluation today patient's toe actually appears to be doing better from the standpoint of infection. She currently would like to see about putting the cash back on I think that this is appropriate as long as she takes care of it and keeps it from getting wet. She is gonna have some drainage we can definitely pass this up with Drawtex and alginate to try to prevent as much drainage as possible from causing the problems. With that being said I do want to at least try her  with the cast between now and Tuesday. If there any issues we can't continue to use it then I will discontinue the use of the cast at that point. 01/08/19 on evaluation today patient actually appears to be doing very well as far as her foot ulcer specifically the great toe on the left is concerned. She did have an area of rubbing on the medial aspect of her left ankle which again is from the cast. Fortunately there's no signs of infection at this point in this appears to be [name] very slight skin breakdown. The patient tells me she felt it rubbing but didn't think it was that bad. Fortunately there is no signs of active infection at this time which is good news. No fevers, chills, nausea, or vomiting noted at this time. 01/15/19 on evaluation today patient actually appears to be doing well in regard to her toe ulcer. Again as previous she seems to do well and she has the cast on which indicates to me that during the time she doesn't have [name] cast on she's putting way too much pressure on this region. Obviously I think that's gonna be an issue as with the current national emergency concerning the Covid-19 Virus  it has been recommended that we discontinue the use of total contact casting by the chief medical officer of our company, Dr. Simona Huh. The reasoning is that if [name] patient becomes sick and cannot come into have the cast removed they could not just leave this on for an additional two weeks. Obviously the hospitals also do not want to receive patient's who are sick into the emergency department to potentially contaminate the region and spread the Covid-19 Virus among other sick individuals within the hospital system. Therefore at this point we are suspending the use of total contact cast until the current emergency subsides. This was all discussed with the patient today as well. 01/22/19 on evaluation today patient's wound on her left great toe appears to be doing slightly worse than previously noted last week. She tells me that she has been on this quite [name] bit in fact she tells me she's been awake for 38 straight hours. This is due to the fact that she's having to care for grandparents because nobody else will. She has been taking care of them for five the last seven days since I've seen her they both have dementia his is from [name] stroke and her grandmother's was progressive. Nonetheless she states even her mom who knows her condition and situation has only help two of those days to take care of them she's been taking care of the rest. Fortunately there does not appear to be any signs of active infection in regard to her toe at this point although obviously it doesn't look as good as it did previous. I think this is directly related to her not taking off the pressure and friction by way of taking things easy. Though I completely understand what's going on. 01/29/19 on evaluation today patient's tools are actually appears to be showing some signs of improvement today compared to last week's evaluation as far as not necessarily the overall size of the wound but the fact that she has some new skin growth in  between the two ends of the wound opening. Overall I feel like she has done well she states that she had [name] family member give her what sounds to be [name] CAM walker boot which has been helpful as well. 02/05/19 on evaluation today patient's wound bed  actually appears to be doing significantly better in regard to her overall appearance of the size of the wound. With that being said she is still having an issue with offloading efficiently enough to get this to close. Apparently there is some signs of infection at this point as well unfortunately. Previously she's done well of Augmentin I really do not see anything that needs to be culture currently but there theme and cellulitis of the foot that I'm seeing I'm gonna go ahead and place her on an antibiotic today to try to help clear this up. 02/12/2019 on evaluation today patient actually appears to be doing poorly in regard to her overall wound status. She tells me she has been using her offloading shoe but actually comes in today wearing her tennis shoe with the AFO brace. Again as I previously discussed with her this is really not sufficient to allow the area to heal appropriately. Nonetheless she continues to be somewhat noncompliant and I do wonder based on what she has told my nurse in the past as to whether or not [name] good portion of this noncompliance may be recreational drug and alcohol related. She has had [name] history of heroin overdose and this was fairly recently in the past couple of months that have been seeing her. Nonetheless overall I feel like her wound looks significantly worse today compared to what it was previous. She still has significant erythema despite the Augmentin I am not sure that this is an appropriate medication for her infection I am also concerned that the infection may have gone down into her bone. 02/19/19 on evaluation today patient actually appears to be doing about the same in regard to her toe ulcer. Unfortunately she  continues to show signs of bone exposure and infection at this point. There does not appear to be any evidence of worsening of the infection but I'm also not really sure that it's getting significantly better. She is on the Augmentin which should be sufficient for the Staphylococcus aureus infection that she has at this point. With that being said she may need IV antibiotics to more appropriately treat this. We did have [name] discussion today about hyperbaric option therapy. 02/28/19 on evaluation today patient actually appears to be doing much worse in regard to the wound on her left great toe as compared to even my previous evaluation last week. Unfortunately this seems to be training in [name] pretty poor direction. Her toe was actually now starting to angle laterally and I can actually see the entire joint area of the proximal portion of the digit where is the distal portion of the digit again is no longer even in contact with the joint line. Unfortunately there's [name] lot more necrotic tissue around the edge and the toe appears to be showing signs of becoming gangrenous in my pinion. I'm very concerned about were things stand at this point. She did see infectious disease and they are planning to send in [name] prescription for Sivextro for her and apparently this has been approved. With that being said I don't think she should avoid taking this but at the same time I'm not sure that it's gonna be sufficient to save her toe at this point. She tells me that she still having to care for grandparents which I think is putting quite [name] bit of strain on her foot and specifically the total area and has caused this to break down even to [name] greater degree than would've otherwise been expected. 03/05/19 on  evaluation today patient actually appears to be doing quite well in regard to her toe all things considering. She still has bone exposed but there appears to be much less your thing on overall the appearance of the wound and  the toe itself is dramatically improved. She still does have some issues currently obviously with infection she did see vascular as well and there concerned that her blood flow to the toad. For that reason they are setting up for an angiogram next week. 03/14/19 on evaluation today patient appears to be doing very poor in regard to her toe and specifically in regard to the ulceration and the fact that she's starting to notice the toe was leaning even more towards the lateral aspect and the complete joint is visible on the proximal aspect of the joint. Nonetheless she's also noted [name] significant odor and the tip of the toe is turning more dark and necrotic appearing. Overall I think she is getting worse not better as far as this is concerned. For that reason I am recommending at this point that she likely needs to be seen for likely amputation. [name], [name] ([id number]) READMISSION 03/19/2021 This is [name] patient that we cared for in this clinic for [name] prolonged period of time in 2019 and 2020 with [name] left foot and left first toe wound. 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 Luana Shu. In December 2021 she had an ulcer on her right great toe as well as the fourth and fifth toes. 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 She also had an angiogram at that time and underwent angioplasty of the right anterior tibial artery. In any case she claims that the wound on the right foot is closed I did not look at this today which was probably an oversight although I think that should be done next week. 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 According to Dr. Deborra Medina last review that she was out of the area being cared for by another physician but recently came back to his attention. 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 [name] culture of this area showed E. coli apparently before she came back to see Dr. Luana Shu she was supposed to be receiving antibiotics but she did not really take them. Nor is she offloading this area at all. Finally her last hemoglobin A1c listed in epic was in March 2022 at 14.1 she says things are [name] lot better since then although I am not sure. 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 She had this wound at the time. During this admission she had E. coli sepsis [name] TEE was negative. 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 She is still [name] heavy cigarette smoker. She has [name] history of bipolar disorder seizure disorder ABI in our clinic was 1.05 6/1; left midfoot in the setting of [name] TMA done previously. Round circular wound with [name] "knuckle" of protruding tissue. The problem is that the knuckle was not attached to any of the surrounding granulation  and this probed proximally widely I removed [name] large portion of this tissue. This wound goes with considerable undermining laterally. I do not feel any bone there was no purulence but this is [name] deep wound. 6/8; in spite of the debridement I did last week. She arrives with [name] wound looking exactly the same. [name] protruding "knuckle" of tissue nonadherent to most of the surrounding tissue. There is considerable depth around this from 6-12 o'clock at 2.7 cm and undermining of 1 cm. This does not look overtly infected and the x-ray I did last week was negative for any osseous abnormalities. We have been using silver collagen 6/15; deep tissue culture I did last week showed moderate staph aureus and moderate  Pseudomonas. This will definitely require prolonged antibiotic therapy. The pathology on the protuberant area was negative for malignancy fungus etc. 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 We have been using silver collagen. I am going to be prescribing Levaquin for 2 weeks. Her CT scan of the foot is down for 7/5 6/22; CT scan of the foot on 7 5. She says she has hardware in the left leg from her previous fracture. She is on the Levaquin for the deep tissue culture I did that showed methicillin sensitive staph aureus and Pseudomonas. I gave her [name] 2-week supply and she will have another week. She arrives in clinic today with the same protuberant tissue however this is nonadherent to the tissue surrounding it. I am really at [name] loss to explain this unless there is underlying deep tissue infection 6/29; patient presents for 1 week follow-up. She has been using collagen to the wound bed. She reports taking her antibiotics as prescribed.She has no complaints or issues today. She denies signs of infection. 7/6; patient presents for one week followup. She has been using collagen to the wound bed. She states she is taking Levaquin however at times she is not able to keep it down. She denies signs of infection. 7/13; patient presents for 1 week follow-up. She has been using silver alginate to the wound bed. She still has nausea when taking her antibiotics. She denies signs of infection. 7/20; patient presents for 1 week follow-up. She has been using silver alginate with gentamicin cream to the wound bed. She denies any issues and has no complaints today. She denies signs of infection. 7/27; patient presents for 1 week follow-up. She continues to use silver alginate with gentamicin cream to the wound bed. She reports starting her antibiotics. She has no issues or complaints. Overall she reports stability to the wound. 8/3; patient presents for 1  week follow-up. She has been using silver alginate with gentamicin cream to the wound bed. She reports completing all antibiotics. She has no issues or complaints today. She denies signs of infection. 8/17; patient presents for 2-week follow-up. He is to use silver alginate to the wound bed. She has no issues or complaints today. She denies signs of infection. She reports her pain has improved in her foot since last clinic visit 8/24; patient presents for 1 week follow-up. She continues to use silver alginate to the wound bed. She has no issues or complaints. She denies signs of infection. Pain is stable. 9/7; patient presents for follow-up. She missed her last week appointment due to feeling ill. She continues to use silver alginate. She has [name] new wound to the right lower extremity that is covered in eschar. She states It occurred over the past  week and has no idea how it started. She currently denies signs of infection. 9/14; patient presents for follow-up. To the left foot wound she has been using gentamicin cream and silver alginate. To the right lower extremity wound she has been keeping this covered and has not obtain Santyl. 9/21; patient presents for follow-up. She reports using gentamicin cream and silver alginate to the left foot and Santyl to the right lower extremity wound. She has no issues or complaints today. She denies signs of infection. 9/28; patient presents for follow-up. She reports [name] new wound to her right heel. She states this occurred [name] few days ago and is progressively gotten worse. She has been trying to clean the area with [name] Q-tip and Santyl. She reports stability in the other 2 wounds. She has been using gentamicin cream and silver alginate to the left foot and Santyl to the right lower extremity wound. 10/12; patient presents for follow-up. She reports improvement to the wound beds. She is seeing vein and vascular to discuss the potential of [name] left BKA. She states they  are going to do an arteriogram. She continues to use silver alginate with dressing changes to her wounds. 11/2; patient presents for follow-up. She states she has not been doing dressing changes to the wound beds. She states she is not able to offload the areas. She reports chronic pain to her left foot wound. 11/9; patient presents for follow-up. She came in with only socks on. She states she forgot to put on shoes. It is unclear if she is doing any dressing changes. She currently denies systemic signs of infection. 11/16; patient presents for follow-up. She came again only with socks on. She states she does not wear shoes ever. It is unclear if she does dressing changes. She currently denies systemic signs of infection. 11/23; patient presents for follow-up. She wore her shoes today. It still unclear exactly what dressing she is using for each wound but she did states she obtained Dakin's solution and has been using this to the left foot wound. She currently denies signs of infection. [name], [name] ([id number]) 11/30; patient presents for follow-up. She has no issues or complaints today. She currently denies signs of infection. 12/7; patient presents for follow-up. She has no issues or complaints today. She has been using Hydrofera Blue to the right heel wound and Dakin solution to the left foot wound. Her right anterior leg wound is healed. She currently denies signs of infection. 12/14; patient presents for follow-up. She has been using Hydrofera Blue to the right heel and Dakin's to the left foot wounds. She has no issues or complaints today. She denies signs of infection. 12/21; patient presents for follow-up. She reports using Hydrofera Blue to the right heel and Dakin's to the left foot wound. She denies signs of infection. 12/28; patient presents for follow-up. She continues to use Dakin's to the left foot wound and Hydrofera Blue to the right heel wound. She denies signs of  infection. 1/4; patient presents for follow-up. She has no issues or complaints today. She denies signs of infection. 1/11; patient presents for follow-up. It is unclear if she has been dressing these wounds over the past week. She currently denies signs of infection. 1/18; patient presents for follow-up. She states she has been using Dakin's wet-to-dry dressings to the left foot. She has been using Hydrofera Blue to the right foot foot wound. She states that the anterior right leg wound has reopened and draining serous fluid.  She denies signs of infection. Objective Constitutional Vitals Time Taken: 10:48 AM, Height: 69 in, Weight: 185 lbs, BMI: 27.3, Temperature: 98.1 F, Pulse: 66 bpm, Respiratory Rate: 18 breaths/min, Blood Pressure: 130/85 mmHg. General Notes: Left foot (Transmetatarsal amputation): Wound to the plantar aspect that has granulation tissue with nonviable tissue and increased pockets of depth. There is circumferential callus. Right foot: To the heel there is an open wound with Granulation tissue , And scant nonviable tissue withCircumferential callus. No obvious signs of infection to any wound. Right lower extremity: To the anterior aspect there is an open wound with dried nonviable tissue tightly adhered Integumentary (Hair, Skin) Wound #10 status is Open. Original cause of wound was Gradually Appeared. The date acquired was: 11/04/2021. The wound is located on the Right,Midline Lower Leg. The wound measures 1.1cm length x 1.2cm width x 0.1cm depth; 1.037cm^2 area and 0.104cm^3 volume. There is no tunneling or undermining noted. There is [name] medium amount of serosanguineous drainage noted. There is no granulation within the wound bed. There is [name] large (67-100%) amount of necrotic tissue within the wound bed including Eschar. Wound #7 status is Open. Original cause of wound was Gradually Appeared. The date acquired was: 08/07/2020. The wound has been in treatment 33 weeks. The  wound is located on the [place]. The wound measures 3.9cm length x 1.6cm width x 0.3cm depth; 4.901cm^2 area and 1.47cm^3 volume. There is Fat Layer (Subcutaneous Tissue) exposed. There is no tunneling or undermining noted. There is [name] large amount of serosanguineous drainage noted. The wound margin is thickened. There is large (67-100%) pink, hyper - granulation within the wound bed. There is [name] small (1-33%) amount of necrotic tissue within the wound bed including Adherent Slough. Wound #9 status is Open. Original cause of wound was Gradually Appeared. The date acquired was: 07/18/2021. The wound has been in treatment 16 weeks. The wound is located on the Right Calcaneus. The wound measures 0.4cm length x 0.6cm width x 0.2cm depth; 0.188cm^2 area and 0.038cm^3 volume. There is no tunneling or undermining noted. There is [name] medium amount of serosanguineous drainage noted. The wound margin is well defined and not attached to the wound base. There is small (1-33%) pink granulation within the wound bed. There is [name] large (67- 100%) amount of necrotic tissue within the wound bed including Eschar and Adherent Slough. 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 ([id number]) Patient's bilateral feet wounds have shown improvement in size and appearance since last clinic visit. I debrided nonviable tissue to the left foot wound. I recommended continuing Dakin's wet-to-dry to this area and Hydrofera Blue to the right foot wound. Unfortunately she has had  some drainage from the right anterior leg. This is occurring to the previous wound site that healed previously. I recommended using Santyl and keeping this area covered. Procedures Wound #7 Pre-procedure diagnosis of Wound #7 is [name] Diabetic Wound/Ulcer of the Lower Extremity located on the [place] .Severity of Tissue Pre Debridement is: Fat layer exposed. There was [name] Excisional Skin/Subcutaneous Tissue Debridement with [name] total area of 6.24 sq cm performed by  Kalman Shan, MD. With the following instrument(s): Curette to remove Viable and Non-Viable tissue/material. Material removed includes Callus, Subcutaneous Tissue, and Slough. No specimens were taken. [name] time out was conducted at 11:33, prior to the start of the procedure. [name] Minimum amount of bleeding was controlled with Pressure. The procedure was tolerated well. Post Debridement Measurements: 3.9cm length x 1.6cm width x 0.3cm depth; 1.47cm^3 volume. Character of Wound/Ulcer Post Debridement is stable. Severity of Tissue Post Debridement is: Fat layer exposed. Post procedure Diagnosis Wound #7: Same as Pre-Procedure Plan Follow-up Appointments: Return Appointment in 1 week. Bathing/ Shower/ Hygiene: Wash wounds with antibacterial soap and water. Off-Loading: Open toe surgical shoe with peg assist. Turn and reposition every 2 hours Other: - peg shoe right, off-loader left Medications-Please add to medication list.: P.O. Antibiotics WOUND #10: - Lower Leg Wound Laterality: Right, Midline Topical: Santyl Collagenase Ointment, 30 (gm), tube 1 x Per Day/30 Days Discharge Instructions: apply nickel thick to wound bed only Primary Dressing: Zetuvit Plus Silicone Border Dressing 4x4 (in/in) 1 x Per Day/30 Days WOUND #7: - Foot Wound Laterality: Plantar, Left, Midline Cleanser: Dakin 16 (oz) 0.25 1 x Per Day/30 Days Discharge Instructions: Use as directed. Topical: Gentamicin 1 x Per Day/30 Days Discharge  Instructions: Apply as directed by provider. IN OFFICE ONLY Primary Dressing: Gauze 1 x Per Day/30 Days Discharge Instructions: As directed: moistened with Dakins Solution Secondary Dressing: Kerlix 4.5 x 4.1 (in/yd) 1 x Per Day/30 Days Discharge Instructions: Apply Kerlix 4.5 x 4.1 (in/yd) as instructed Secured With: Kerlix Roll Sterile or Non-Sterile 6-ply 4.5x4 (yd/yd) 1 x Per Day/30 Days Discharge Instructions: Apply Kerlix as directed WOUND #9: - Calcaneus Wound Laterality: Right Cleanser: Soap and Water 1 x Per Day/30 Days Discharge Instructions: Gently cleanse wound with antibacterial soap, rinse and pat dry prior to dressing wounds Topical: Gentamicin 1 x Per Day/30 Days Discharge Instructions: Apply as directed by provider. IN OFFICE ONLY Primary Dressing: Hydrofera Blue Ready Transfer Foam, 2.5x2.5 (in/in) 1 x Per Day/30 Days Discharge Instructions: Apply Hydrofera Blue Ready to wound bed as directed Secondary Dressing: Zetuvit Plus Silicone Border Dressing 4x4 (in/in) 1 x Per Day/30 Days Secured With: coverlet 1 x Per Day/30 Days 1. In office sharp debridement 2. Dakin's wet-to-dry 3. Hydrofera Blue 4. Santyl 5. Follow-up in 1 week 6. Aggressive offloading Fouty, Jalexis [name]. ([id number]) Electronic Signature(s) Signed: 11/10/2021 12:08:14 PM By: Kalman Shan DO Entered By: Kalman Shan on 11/10/2021 12:07:13 [name] ([id number]) -------------------------------------------------------------------------------- SuperBill Details Patient Name: Heather Dear [name]. Date of Service: 11/10/2021 Medical Record Number: [id number] Patient Account Number: [id number] Date of Birth/Sex: 11-26-1975 (46 y.o. F) Treating RN: Levora Dredge Primary Care Provider: Raelene Bott Other Clinician: Referring Provider: Raelene Bott Treating Provider/Extender: Yaakov Guthrie in Treatment: 33 Diagnosis Coding ICD-10 Codes Code Description [phone] Non-pressure chronic  ulcer of other part of left foot with other specified severity L97.512 Non-pressure chronic ulcer of other part of right foot with fat layer exposed L97.812 Non-pressure chronic ulcer of other part of right lower leg with fat layer exposed E11.621 Type 2 diabetes mellitus with foot ulcer E11.42 Type 2 diabetes mellitus with diabetic polyneuropathy L97.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 of skin Facility Procedures CPT4 Code: 20100712 Description: 11042 - DEB SUBQ TISSUE 20 SQ CM/< Modifier: Quantity: 1 CPT4 Code: Description: ICD-10 Diagnosis Description 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 E11.621 Type 2 diabetes mellitus with foot ulcer  Modifier: severity Quantity: Physician Procedures CPT4 Code: 2585277 Description: 82423 - WC PHYS SUBQ TISS 20 SQ CM Modifier: Quantity: 1 CPT4 Code: Description: ICD-10 Diagnosis Description 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 E11.621 Type 2 diabetes mellitus with foot ulcer Modifier: severity Quantity: Electronic Signature(s) Signed: 11/10/2021 12:08:14 PM By: Kalman Shan DO Entered By: Kalman Shan on 11/10/2021 12:07:26

## 2021-11-17 ENCOUNTER — Other Ambulatory Visit: Payer: Self-pay

## 2021-11-17 ENCOUNTER — Encounter (HOSPITAL_BASED_OUTPATIENT_CLINIC_OR_DEPARTMENT_OTHER): Payer: Medicaid Other | Admitting: Internal Medicine

## 2021-11-17 DIAGNOSIS — L97512 Non-pressure chronic ulcer of other part of right foot with fat layer exposed: Secondary | ICD-10-CM | POA: Diagnosis not present

## 2021-11-17 DIAGNOSIS — E11621 Type 2 diabetes mellitus with foot ulcer: Secondary | ICD-10-CM | POA: Diagnosis not present

## 2021-11-17 DIAGNOSIS — L97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ity: Secondary | ICD-10-CM | POA: Diagnosis not present

## 2021-11-17 NOTE — Progress Notes (Signed)
DAYLA, GASCA ([id number]) Visit Report for 11/17/2021 Arrival Information Details Patient Name: [name], [name] Date of Service: 11/17/2021 10:30 AM Medical Record Number: [id number] Patient Account Number: [id number] Date of Birth/Sex: July 18, 1976 (46 y.o. F) Treating RN: Levora Dredge Primary Care [name]: [name] Other Clinician: Referring [name]: [name] Treating Nylene Inlow/Extender: Yaakov Guthrie in Treatment: 27 Visit Information History Since Last Visit Added or deleted any medications: No Patient Arrived: Wheel Chair Any new allergies or adverse reactions: No Arrival Time: 10:19 Had a fall or experienced change in No Accompanied By: mother activities of daily living that may affect Transfer Assistance: EasyPivot Patient Lift risk of falls: Patient Identification Verified: Yes Hospitalized since last visit: No Secondary Verification Process Completed: Yes Has Dressing in Place as Prescribed: Yes Patient Requires Transmission-Based No Has Footwear/Offloading in Place as Prescribed: Yes Precautions: Left: Surgical Shoe with Pressure Relief Patient Has Alerts: Yes Insole Patient Alerts: Patient on Blood Right: Surgical Shoe with Pressure Relief Thinner Insole 39m apirin Pain Present Now: No Diabetic Type II Electronic Signature(s) Signed: 11/17/2021 4:52:49 PM By: GLevora DredgeEntered By: GLevora Dredgeon 11/17/2021 10:19:45 SLonzo [name]([id number] -------------------------------------------------------------------------------- Clinic Level of Care Assessment Details Patient Name: [name] Date of Service: 11/17/2021 10:30 AM Medical Record Number: [id number] Account Number: [id number] of Birth/Sex: 905-06-1976(46y.o. F) Treating RN: GLevora DredgePrimary Care [name]: HRaelene BottOther Clinician: Referring [name]: HRaelene BottTreating Carlette Palmatier/Extender: HYaakov Guthriein Treatment:  335Clinic Level of Care Assessment Items TOOL 1 Quantity Score [] - Use when EandM and Procedure is performed on INITIAL visit 0 ASSESSMENTS - Nursing Assessment / Reassessment [] - General Physical Exam (combine w/ comprehensive assessment (listed just below) when performed on new 0 pt. evals) [] - 0 Comprehensive Assessment (HX, ROS, Risk Assessments, Wounds Hx, etc.) ASSESSMENTS - Wound and Skin Assessment / Reassessment [] - Dermatologic / Skin Assessment (not related to wound area) 0 ASSESSMENTS - Ostomy and/or Continence Assessment and Care [] - Incontinence Assessment and Management 0 [] - 0 Ostomy Care Assessment and Management (repouching, etc.) PROCESS - Coordination of Care [] - Simple Patient / Family Education for ongoing care 0 [] - 0 Complex (extensive) Patient / Family Education for ongoing care [] - 0 Staff obtains CProgrammer, systems Records, Test Results / Process Orders [] - 0 Staff telephones HHA, Nursing Homes / Clarify orders / etc [] - 0 Routine Transfer to another Facility (non-emergent condition) [] - 0 Routine Hospital Admission (non-emergent condition) [] - 0 New Admissions / IBiomedical engineer/ Ordering NPWT, Apligraf, etc. [] - 0 Emergency Hospital Admission (emergent condition) PROCESS - Special Needs [] - Pediatric / Minor Patient Management 0 [] - 0 Isolation Patient Management [] - 0 Hearing / Language / Visual special needs [] - 0 Assessment of Community assistance (transportation, D/C planning, etc.) [] - 0 Additional assistance / Altered mentation [] - 0 Support Surface(s) Assessment (bed, cushion, seat, etc.) INTERVENTIONS - Miscellaneous [] - External ear exam 0 [] - 0 Patient Transfer (multiple staff / HCivil Service fast streamer/ Similar devices) [] - 0 Simple Staple / Suture removal (25 or less) [] - 0 Complex Staple / Suture removal (26 or more) [] - 0 Hypo/Hyperglycemic Management (do not check if billed separately) [] - 0 Ankle /  Brachial Index (ABI) - do not check if billed separately Has the patient been seen at the hospital within the last three years: Yes Total Score: 0 Level Of Care:  ____ [name] ([id number]) Electronic Signature(s) Signed: 11/17/2021 4:52:49 PM By: Levora Dredge Entered By: Levora Dredge on 11/17/2021 11:39:06 [name] ([id number]) -------------------------------------------------------------------------------- Encounter Discharge Information Details Patient Name: Heather Dear A. Date of Service: 11/17/2021 10:30 AM Medical Record Number: [id number] Patient Account Number: [id number] Date of Birth/Sex: 11/20/1975 (46 y.o. F) Treating RN: Levora Dredge Primary Care [name]: [name] Other Clinician: Referring [name]: [name] Treating Sahas Sluka/Extender: Yaakov Guthrie in Treatment: 66 Encounter Discharge Information Items Post Procedure Vitals Discharge Condition: Stable Temperature (F): 97.7 Ambulatory Status: Wheelchair Pulse (bpm): 102 Discharge Destination: Home Respiratory Rate (breaths/min): 18 Transportation: Private Auto Blood Pressure (mmHg): 161/107 Accompanied By: mother Schedule Follow-up Appointment: Yes Clinical Summary of Care: Electronic Signature(s) Signed: 11/17/2021 4:52:49 PM By: Levora Dredge Entered By: Levora Dredge on 11/17/2021 11:41:38 [name], [name] ([id number]) -------------------------------------------------------------------------------- Lower Extremity Assessment Details Patient Name: Heather Dear A. Date of Service: 11/17/2021 10:30 AM Medical Record Number: [id number] Patient Account Number: [id number] Date of Birth/Sex: [birth year] (46 y.o. F) Treating RN: Levora Dredge Primary Care Mahamud Metts: [name] Other Clinician: Referring Chariah Bailey: [name] Treating Aziel Morgan/Extender: Yaakov Guthrie in Treatment: 34 Edema Assessment Assessed: [Left: No] [Right: No] Edema: [Left:  No] [Right: No] Calf Left: Right: Point of Measurement: 30 cm From Medial Instep 27.2 cm 29.1 cm Ankle Left: Right: Point of Measurement: 10 cm From Medial Instep 18.2 cm 18.2 cm Vascular Assessment Pulses: Dorsalis Pedis Palpable: [Left:Yes] [Right:Yes] Posterior Tibial Palpable: [Left:Yes] [Right:Yes] Electronic Signature(s) Signed: 11/17/2021 4:52:49 PM By: Levora Dredge Entered By: Levora Dredge on 11/17/2021 10:37:20 [name] ([id number]) -------------------------------------------------------------------------------- Multi Wound Chart Details Patient Name: Heather Dear A. Date of Service: 11/17/2021 10:30 AM Medical Record Number: [id number] Patient Account Number: [id number] Date of Birth/Sex: Apr 13, 1976 (46 y.o. 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 Extremity Extremity Comorbid History: Chronic sinus problems/congestion, Chronic sinus problems/congestion, Chronic sinus problems/congestion, Middle ear problems, Anemia, Middle ear problems, Anemia, Middle ear problems, Anemia, Chronic Obstructive Pulmonary Chronic Obstructive Pulmonary Chronic Obstructive Pulmonary Disease (COPD), Congestive Heart Disease (COPD), Congestive Heart Disease (COPD), Congestive Heart Failure, Type II Diabetes, End Stage Failure, Type II Diabetes, End Stage Failure,  Type II Diabetes, End Stage Renal Disease, History of pressure Renal Disease, History of pressure Renal Disease, History of pressure wounds, Neuropathy wounds, Neuropathy wounds, Neuropathy Date Acquired: 11/04/2021 08/07/2020 07/18/2021 Weeks of Treatment: 1 34 17 Wound Status: Open Open Open Wound Recurrence: No No No Measurements L x W x D (cm) 1.2x0.8x0.1 4.3x1.5x0.3 0.4x0.4x0.2 Area (cm) : 0.754 5.066 0.126 Volume (cm) : 0.075 1.52 0.025 % Reduction in Area: 27.30% -89.70% 61.80% % Reduction in Volume: 27.90% -13.90% 89.20% Classification: Grade 1 Grade 2 Grade 1 Exudate Amount: Medium Large Medium Exudate Type: Serosanguineous Serosanguineous Serosanguineous Exudate Color: red, brown red, brown red, brown Wound Margin: N/A Thickened Well defined, not attached Granulation Amount: Small (1-33%) Large (67-100%) Small (1-33%) Granulation Quality: Pink Pink, Hyper-granulation Pink Necrotic Amount: Large (67-100%) Small (1-33%) Large (67-100%) Necrotic Tissue: Eschar Adherent [place] Exposed Structures: Fat Layer (Subcutaneous Tissue): Fat Layer (  Subcutaneous Tissue): Fat Layer (Subcutaneous Tissue): °Yes Yes Yes °Fascia: No °Fascia: No °Tendon: No °Tendon: No °Muscle: No °Muscle: No °Joint: No °Joint: No °Bone: No °Bone: No °Epithelialization: Small (1-33%) None None °Treatment Notes °Kaleta, Demi A. ([id number]) °Electronic Signature(s) °Signed: 11/17/2021 4:52:49 PM By: Gordon, Caitlin °Entered By: Gordon, Caitlin on 11/17/2021 11:14:44 °[name], [name] ([id number]) °-------------------------------------------------------------------------------- °Multi-Disciplinary Care Plan Details °Patient Name: Nishiyama, Elin A. °Date of Service: 11/17/2021 10:30 AM °Medical Record Number: [id number] °Patient Account Number: [id number] °Date of Birth/Sex: 01/25/1976 (45 y.o. F) °Treating RN: Gordon, Caitlin °Primary Care : Hoffman, [name] Other Clinician: °Referring :  Hoffman, [name] °Treating /Extender: Hoffman, Jessica °Weeks in Treatment: 34 °Active Inactive °Necrotic Tissue °Nursing Diagnoses: °Impaired tissue integrity related to necrotic/devitalized tissue °Knowledge deficit related to management of necrotic/devitalized tissue °Goals: °Necrotic/devitalized tissue will be minimized in the wound bed °Date Initiated: 04/07/2021 °Target Resolution Date: 10/08/2021 °Goal Status: Active °Patient/caregiver will verbalize understanding of reason and process for debridement of necrotic tissue °Date Initiated: 04/07/2021 °Date Inactivated: 09/22/2021 °Target Resolution Date: 09/08/2021 °Goal Status: Met °Interventions: °Assess patient pain level pre-, during and post procedure and prior to discharge °Provide education on necrotic tissue and debridement process °Treatment Activities: °Excisional debridement : 04/07/2021 °Notes: °Electronic Signature(s) °Signed: 11/17/2021 4:52:49 PM By: Gordon, Caitlin °Entered By: Gordon, Caitlin on 11/17/2021 11:13:57 °[name], [name] ([id number]) °-------------------------------------------------------------------------------- °Pain Assessment Details °Patient Name: Bolding, Azani A. °Date of Service: 11/17/2021 10:30 AM °Medical Record Number: [id number] °Patient Account Number: [id number] °Date of Birth/Sex: 12/08/1975 (45 y.o. F) °Treating RN: Gordon, Caitlin °Primary Care : Hoffman, [name] Other Clinician: °Referring : Hoffman, [name] °Treating /Extender: Hoffman, Jessica °Weeks in Treatment: 34 °Active Problems °Location of Pain Severity and Description of Pain °Patient Has Paino No °Site Locations °Rate the pain. °Current Pain Level: 0 °Pain Management and Medication °Current Pain Management: °Electronic Signature(s) °Signed: 11/17/2021 4:52:49 PM By: Gordon, Caitlin °Entered By: Gordon, Caitlin on 11/17/2021 10:24:00 °[name], [name]  ([id number]) °-------------------------------------------------------------------------------- °Patient/Caregiver Education Details °Patient Name: [name], [name] °Date of Service: 11/17/2021 10:30 AM °Medical Record Number: [id number] °Patient Account Number: [id number] °Date of Birth/Gender: 01/13/1976 (45 y.o. F) °Treating RN: Gordon, Caitlin °Primary Care Physician: Hoffman, [name] Other Clinician: °Referring Physician: Hoffman, [name] °Treating Physician/Extender: Hoffman, Jessica °Weeks in Treatment: 34 °Education Assessment °Education Provided To: °Patient °Education Topics Provided °Wound Debridement: °Handouts: Wound Debridement °Methods: Explain/Verbal °Responses: State content correctly °Wound/Skin Impairment: °Handouts: Caring for Your Ulcer °Methods: Explain/Verbal °Responses: State content correctly °Electronic Signature(s) °Signed: 11/17/2021 4:52:49 PM By: Gordon, Caitlin °Entered By: Gordon, Caitlin on 11/17/2021 11:39:37 °[name], [name] ([id number]) °-------------------------------------------------------------------------------- °Wound Assessment Details °Patient Name: [name], [name] °Date of Service: 11/17/2021 10:30 AM °Medical Record Number: [id number] °Patient Account Number: [id number] °Date of Birth/Sex: 06/28/1976 (45 y.o. F) °Treating RN: Gordon, Caitlin °Primary Care : Hoffman, [name] Other Clinician: °Referring : Hoffman, [name] °Treating /Extender: Hoffman, Jessica °Weeks in Treatment: 34 °Wound Status °Wound Number: 10 Primary Diabetic Wound/Ulcer of the Lower Extremity °Etiology: °Wound Location: Right, Midline Lower Leg °Wound Open °Wounding Event: Gradually Appeared °Status: °Date Acquired: 11/04/2021 °Comorbid Chronic sinus problems/congestion, Middle ear problems, °Weeks Of Treatment: 1 °History: Anemia, Chronic Obstructive Pulmonary Disease (COPD), °Clustered Wound: No °Congestive Heart Failure, Type II Diabetes, End Stage Renal °Disease, History of pressure  wounds, Neuropathy °Photos °Wound Measurements °Length: (cm) 1.2 °Width: (cm) 0.8 °Depth: (cm) 0.1 °Area: (cm²) 0.754 °Volume: (cm³) 0.075 °% Reduction in Area: 27.3% °% Reduction in Volume: 27.9% °Epithelialization: Small (1-33%) °Tunneling: No °Undermining: No °Wound Description °Classification: Grade 1 °  Exudate Amount: Medium °Exudate Type: Serosanguineous °Exudate Color: red, brown °Foul Odor After Cleansing: No °Slough/Fibrino Yes °Wound Bed °Granulation Amount: Small (1-33%) Exposed Structure °Granulation Quality: Pink °Fat Layer (Subcutaneous Tissue) Exposed: Yes °Necrotic Amount: Large (67-100%) °Necrotic Quality: Eschar °Treatment Notes °Wound #10 (Lower Leg) Wound Laterality: Right, Midline °Cleanser °Peri-Wound Care °Topical °Santyl Collagenase Ointment, 30 (gm), tube °Discharge Instruction: apply nickel thick to wound bed only °[name], [name] ([id number]) °Primary Dressing °Zetuvit Plus Silicone Border Dressing 4x4 (in/in) °Secondary Dressing °Secured With °Compression Wrap °Compression Stockings °Add-Ons °Electronic Signature(s) °Signed: 11/17/2021 4:52:49 PM By: Gordon, Caitlin °Entered By: Gordon, Caitlin on 11/17/2021 10:33:28 °[name], [name] ([id number]) °-------------------------------------------------------------------------------- °Wound Assessment Details °Patient Name: [name], [name] °Date of Service: 11/17/2021 10:30 AM °Medical Record Number: [id number] °Patient Account Number: [id number] °Date of Birth/Sex: 01/29/1976 (45 y.o. F) °Treating RN: Gordon, Caitlin °Primary Care : Hoffman, [name] Other Clinician: °Referring : Hoffman, [name] °Treating /Extender: Hoffman, Jessica °Weeks in Treatment: 34 °Wound Status °Wound Number: 7 Primary Diabetic Wound/Ulcer of the Lower Extremity °Etiology: °Wound Location: Left, Midline, Plantar Foot °Wound Open °Wounding Event: Gradually Appeared °Status: °Date Acquired: 08/07/2020 °Comorbid Chronic sinus problems/congestion, Middle  ear problems, °Weeks Of Treatment: 34 °History: Anemia, Chronic Obstructive Pulmonary Disease (COPD), °Clustered Wound: No °Congestive Heart Failure, Type II Diabetes, End Stage Renal °Disease, History of pressure wounds, Neuropathy °Photos °Wound Measurements °Length: (cm) 4.3 °Width: (cm) 1.5 °Depth: (cm) 0.3 °Area: (cm²) 5.066 °Volume: (cm³) 1.52 °% Reduction in Area: -89.7% °% Reduction in Volume: -13.9% °Epithelialization: None °Tunneling: No °Undermining: No °Wound Description °Classification: Grade 2 °Wound Margin: Thickened °Exudate Amount: Large °Exudate Type: Serosanguineous °Exudate Color: red, brown °Foul Odor After Cleansing: No °Slough/Fibrino Yes °Wound Bed °Granulation Amount: Large (67-100%) Exposed Structure °Granulation Quality: Pink, Hyper-granulation °Fascia Exposed: No °Necrotic Amount: Small (1-33%) °Fat Layer (Subcutaneous Tissue) Exposed: Yes °Necrotic Quality: Adherent Slough °Tendon Exposed: No °Muscle Exposed: No °Joint Exposed: No °Bone Exposed: No °Treatment Notes °Wound #7 (Foot) Wound Laterality: Plantar, Left, Midline °Cleanser °Dakin 16 (oz) 0.25 °Discharge Instruction: Use as directed. °Mcgaughey, Cerina A. ([id number]) °Peri-Wound Care °Topical °Primary Dressing °Gauze °Discharge Instruction: As directed: moistened with Dakins Solution °Secondary Dressing °Kerlix 4.5 x 4.1 (in/yd) °Discharge Instruction: Apply Kerlix 4.5 x 4.1 (in/yd) as instructed °Secured With °Kerlix Roll Sterile or Non-Sterile 6-ply 4.5x4 (yd/yd) °Discharge Instruction: Apply Kerlix as directed °Compression Wrap °Compression Stockings °Add-Ons °Electronic Signature(s) °Signed: 11/17/2021 4:52:49 PM By: Gordon, Caitlin °Entered By: Gordon, Caitlin on 11/17/2021 10:34:45 °[name], Malayiah A. ([id number]) °-------------------------------------------------------------------------------- °Wound Assessment Details °Patient Name: Polcyn, Maddilynn A. °Date of Service: 11/17/2021 10:30 AM °Medical Record Number:  [id number] °Patient Account Number: [id number] °Date of Birth/Sex: 05/18/1976 (45 y.o. F) °Treating RN: Gordon, Caitlin °Primary Care : Hoffman, [name] Other Clinician: °Referring : Hoffman, [name] °Treating /Extender: Hoffman, Jessica °Weeks in Treatment: 34 °Wound Status °Wound Number: 9 Primary Diabetic Wound/Ulcer of the Lower Extremity °Etiology: °Wound Location: Right Calcaneus °Wound Open °Wounding Event: Gradually Appeared °Status: °Date Acquired: 07/18/2021 °Comorbid Chronic sinus problems/congestion, Middle ear problems, °Weeks Of Treatment: 17 °History: Anemia, Chronic Obstructive Pulmonary Disease (COPD), °Clustered Wound: No °Congestive Heart Failure, Type II Diabetes, End Stage Renal °Disease, History of pressure wounds, Neuropathy °Photos °Wound Measurements °Length: (cm) 0.4 °Width: (cm) 0.4 °Depth: (cm) 0.2 °Area: (cm²) 0.126 °Volume: (cm³) 0.025 °% Reduction in Area: 61.8% °% Reduction in Volume: 89.2% °Epithelialization: None °Tunneling: No °Undermining: No °Wound Description °Classification: Grade 1 °Wound Margin: Well defined, not attached °Exudate Amount: Medium °Exudate Type: Serosanguineous °Exudate Color: red, brown °Foul   Odor After Cleansing: No °Slough/Fibrino Yes °Wound Bed °Granulation Amount: Small (1-33%) Exposed Structure °Granulation Quality: Pink °Fascia Exposed: No °Necrotic Amount: Large (67-100%) °Fat Layer (Subcutaneous Tissue) Exposed: Yes °Necrotic Quality: Eschar, Adherent Slough °Tendon Exposed: No °Muscle Exposed: No °Joint Exposed: No °Bone Exposed: No °Treatment Notes °Wound #9 (Calcaneus) Wound Laterality: Right °Cleanser °Soap and Water °Discharge Instruction: Gently cleanse wound with antibacterial soap, rinse and pat dry prior to dressing wounds °Newbern, Allisson A. ([id number]) °Peri-Wound Care °Topical °Primary Dressing °Hydrofera Blue Ready Transfer Foam, 2.5x2.5 (in/in) °Discharge Instruction: Apply Hydrofera Blue Ready to wound bed as  directed °Secondary Dressing °Zetuvit Plus Silicone Border Dressing 4x4 (in/in) °Secured With °coverlet °Compression Wrap °Compression Stockings °Add-Ons °Electronic Signature(s) °Signed: 11/17/2021 4:52:49 PM By: Gordon, Caitlin °Entered By: Gordon, Caitlin on 11/17/2021 10:35:39 °Crewe, Yeraldy A. ([id number]) °-------------------------------------------------------------------------------- °Vitals Details °Patient Name: Bienkowski, Sanjana A. °Date of Service: 11/17/2021 10:30 AM °Medical Record Number: [id number] °Patient Account Number: [id number] °Date of Birth/Sex: 12/02/1975 (45 y.o. F) °Treating RN: Gordon, Caitlin °Primary Care : Hoffman, [name] Other Clinician: °Referring : Hoffman, [name] °Treating /Extender: Hoffman, Jessica °Weeks in Treatment: 34 °Vital Signs °Time Taken: 10:19 °Temperature (Â°F): 97.7 °Height (in): 69 °Pulse (bpm): 102 °Weight (lbs): 185 °Respiratory Rate (breaths/min): 18 °Body Mass Index (BMI): 27.3 °Blood Pressure (mmHg): 161/107 °Reference Range: 80 - 120 mg / dl °Notes °pt states asymptomatic, pt states takes BP meds at night, MD Hoffman made aware °Electronic Signature(s) °Signed: 11/17/2021 4:52:49 PM By: Gordon, Caitlin °Entered By: Gordon, Caitlin on 11/17/2021 10:23:49 °

## 2021-11-17 NOTE — Progress Notes (Signed)
JAIDIN, UGARTE ([id number]) Visit Report for 11/17/2021 Chief Complaint Document Details Patient Name: [name], [name] Date of Service: 11/17/2021 10:30 AM Medical Record Number: [id number] Patient Account Number: [id number] Date of Birth/Sex: 1976-09-22 (46 y.o. F) Treating RN: Levora Dredge Primary Care Provider: Raelene Bott Other Clinician: Referring Provider: Raelene Bott Treating Provider/Extender: Yaakov Guthrie in Treatment: 107 Information Obtained from: Patient Chief Complaint 03/19/2021; patient referred by Dr. Luana Shu who has been looking after her left foot for quite [name] period of time for review of [name] nonhealing area in the left midfoot Electronic Signature(s) Signed: 11/17/2021 11:44:49 AM By: Kalman Shan DO Entered By: Kalman Shan on 11/17/2021 11:37:28 [name] ([id number]) -------------------------------------------------------------------------------- Debridement Details Patient Name: [name] Date of Service: 11/17/2021 10:30 AM Medical Record Number: [id number] Patient Account Number: [id number] Date of Birth/Sex: 1975/12/07 (46 y.o. F) Treating RN: Levora Dredge Primary Care Provider: Raelene Bott Other Clinician: Referring Provider: Raelene Bott Treating Provider/Extender: Yaakov Guthrie in Treatment: 34 Debridement Performed for Wound #9 Right Calcaneus Assessment: Performed By: Physician Kalman Shan, MD Debridement Type: Debridement Severity of Tissue Pre Debridement: Fat layer exposed Level of Consciousness (Pre- Awake and Alert procedure): Pre-procedure Verification/Time Out Yes - 11:15 Taken: Pain Control: Lidocaine 4% Topical Solution Total Area Debrided (L x W): 0.4 (cm) x 0.4 (cm) = 0.16 (cm) Tissue and other material Viable, Non-Viable, Subcutaneous debrided: Level: Skin/Subcutaneous Tissue Debridement Description: Excisional Instrument: Curette Bleeding: Minimum Hemostasis Achieved:  Pressure Response to Treatment: Procedure was tolerated well Level of Consciousness (Post- Awake and Alert procedure): Post Debridement Measurements of Total Wound Length: (cm) 0.4 Width: (cm) 0.4 Depth: (cm) 0.4 Volume: (cm) 0.05 Character of Wound/Ulcer Post Debridement: Stable Severity of Tissue Post Debridement: Fat layer exposed Post Procedure Diagnosis Same as Pre-procedure Electronic Signature(s) Signed: 11/17/2021 11:44:49 AM By: Kalman Shan DO Signed: 11/17/2021 4:52:49 PM By: Levora Dredge Entered By: Levora Dredge on 11/17/2021 11:16:52 [name] ([id number]) -------------------------------------------------------------------------------- Debridement Details Patient Name: [name] Date of Service: 11/17/2021 10:30 AM Medical Record Number: [id number] Patient Account Number: [id number] Date of Birth/Sex: 07-Dec-1975 (46 y.o. F) Treating RN: Levora Dredge Primary Care Provider: Raelene Bott Other Clinician: Referring Provider: Raelene Bott Treating Provider/Extender: Yaakov Guthrie in Treatment: 34 Debridement Performed for Wound #7 Left,Midline,Plantar Foot Assessment: Performed By: Physician Kalman Shan, MD Debridement Type: Debridement Severity of Tissue Pre Debridement: Fat layer exposed Level of Consciousness (Pre- Awake and Alert procedure): Pre-procedure Verification/Time Out Yes - 11:17 Taken: Pain Control: Lidocaine 4% Topical Solution Total Area Debrided (L x W): 4.3 (cm) x 1.5 (cm) = 6.45 (cm) Tissue and other material Viable, Non-Viable, Slough, Subcutaneous, Slough debrided: Level: Skin/Subcutaneous Tissue Debridement Description: Excisional Instrument: Curette Bleeding: Minimum Hemostasis Achieved: Pressure Response to Treatment: Procedure was tolerated well Level of Consciousness (Post- Awake and Alert procedure): Post Debridement Measurements of Total Wound Length: (cm) 4.3 Width: (cm)  1.5 Depth: (cm) 0.3 Volume: (cm) 1.52 Character of Wound/Ulcer Post Debridement: Stable Severity of Tissue Post Debridement: Fat layer exposed Post Procedure Diagnosis Same as Pre-procedure Electronic Signature(s) Signed: 11/17/2021 11:44:49 AM By: Kalman Shan DO Signed: 11/17/2021 4:52:49 PM By: Levora Dredge Entered By: Levora Dredge on 11/17/2021 11:22:17 [name] ([id number]) -------------------------------------------------------------------------------- HPI Details Patient Name: [name] Date of Service: 11/17/2021 10:30 AM Medical Record Number: [id number] Patient Account Number: [id number] Date of Birth/Sex: [birth year] (46 y.o. F) Treating RN: Levora Dredge Primary Care Provider: Raelene Bott Other Clinician: Referring Provider: Raelene Bott Treating Provider/Extender: Yaakov Guthrie in  Treatment: 34 History of Present Illness HPI Description: 01/18/18-She is here for initial evaluation of the left great toe ulcer. She is [name] poor historian in regards to timeframe in detail. She states approximately 4 weeks ago she lacerated her toe on something in the house. She followed up with her primary care who placed her on Bactrim and ultimately [name] second dose of Bactrim prior to coming to wound clinic. She states she has been treating the toe with peroxide, Betadine and [name] Band-Aid. She did not check her blood sugar this morning but checked it yesterday morning it was 327; she is unaware of [name] recent A1c and there are no current records. She saw Dr. she would've orthopedics last week for an old injury to the left ankle, she states he did not see her toe, nor did she bring it to his attention. She smokes approximately 1 pack cigarettes [name] day. Her social situation is concerning, she arrives this morning with her mother who appears extremely intoxicated/under the influence; her mother was asked to leave the room and be monitored by the patient's grandmother.  The patient's aunt then accompanied the patient and the room throughout the rest of the appointment. We had [name] lengthy discussion regarding the deleterious effects of uncontrolled hyperglycemia and smoking as it relates to wound healing and overall health. She was strongly encouraged to decrease her smoking and get her diabetes under better control. She states she is currently on [name] diet and has cut down her [facility] consumption. The left toe is erythematous, macerated and slightly edematous with malodor present. The edema in her left foot is below her baseline, there is no erythema streaking. We will treat her with Santyl, doxycycline; we have ordered and xray, culture and provided [name] Peg assist surgical shoe and cultured the wound. 01/25/18-She is here in follow-up evaluation for [name] left great toe ulcer and presents with an abscess to her suprapubic area. She states her blood sugars remain elevated, feeling "sick" and if levels are below 250, but she is trying. She has made no attempt to decrease her smoking stating that we "can't take away her food in her cigarettes". She has been compliant with offloading using the PEG assist you. She is using Santyl daily. the culture obtained last week grew staph aureus and Enterococcus faecalis; continues on the doxycycline and Augmentin was added on Monday. The suprapubic area has erythema, no femoral variation, purple discoloration, minimal induration, was accessed with [name] cotton tip applicator with sanguinopurulent drainage, this was cultured, I suspect the current antibiotic treatment will cover and we will not add anything to her current treatment plan. She was advised to go to urgent care or ER with any change in redness, induration or fever. 02/01/18-She is here in follow-up evaluation for left great toe ulcers and [name] new abdominal abscess from last week. She was able to use packing until earlier this week, where she "forgot it was there". She states she was  feeling ill with GI symptoms last week and was not taking her antibiotic. She states her glucose levels have been predominantly less than 200, with occasional levels between 200-250. She thinks this was contributing to her GI symptoms as they have resolved without intervention. There continues to be significant laceration to left toe, otherwise it clinically looks stable/improved. There is now less superficial opening to the lateral aspect of the great toe that was residual blister. We will transition to [facility] to all wounds, she will continue her Augmentin. If  there is no change or deterioration next week for reculture. 02/08/18-She is here in follow-up evaluation for left great toe ulcer and abdominal ulcer. There is an improvement in both wounds. She has been wrapping her left toe with coban, not by our direction, which has created an area of discoloration to the medial aspect; she has been advised to NOT use coban secondary to her neuropathy. She states her glucose levels have been high over this last week ranging from 200-350, she continues to smoke. She admits to being less compliant with her offloading shoe. We will continue with same treatment plan and she will follow-up next week. 02/15/18-She is here in follow-up evaluation for left great toe ulcer and abdominal ulcer. The abdominal ulcer is epithelialized. The left great toe ulcer is improved and all injury from last week using the Coban wrap is resolved, the lateral ulcer is healed. She admits to noncompliance with wearing offloading shoe and admits to glucose levels being greater than 300 most of the week. She continues to smoke and expresses no desire to quit. There is one area medially that probes deeper than it has historically, erythema to the toe and dorsal foot has consistently waxed and waned. There is no overt signs of cellulitis or infection but we will culture the wound for any occult infection given the new area of depth  and erythema. We will hold off on sensitivities for initiation of antibiotic therapy. 02/22/18-She is here in follow up evaluation for left great toe ulcer. There is overall significant improvement in both wound appearance, erythema and edema with changes made last week. She was not initiated on antibiotic therapy. Culture obtained last week showed oxacillin sensitive staph aureus, sensitive to clindamycin. Clindamycin has been called into the pharmacy but she has been instructed to hold off on initiation secondary to overall clinical improvement and her history of antibiotic intolerance. She has been instructed to contact the clinic with any noted changes/deterioration and the wound, erythema, edema and/or pain. She will follow-up next week. She continues to smoke and her glucose levels remain elevated >250; she admits to compliance with offloading shoe 03/01/18 on evaluation today patient appears to be doing fairly well in regard to her left first toe ulcer. She has been tolerating the dressing changes with the [facility] Dressing without complication and overall this has definitely showed signs of improvement according to records as well is what the patient tells me today. I'm very pleased in that regard. She is having no pain today 03/08/18 She is here for follow up evaluation of [name] left great toe ulcer. She remains non-compliant with glucose control and smoking cessation; glucose levels consistently >200. She states that she got new shoe inserts/peg assist. She admits to compliance with offloading. Since my last evaluation there is significant improvement. We will switch to prisma at this time and she will follow up next week. She is noted to be tachycardic at this appointment, heart rate 120s; she has [name] history of heart rate 70-130 according to our records. She admits to extreme agitation r/t personal issues; she was advised to monitor her heartrate and contact her physician if it does not return  to [name] more normal range (<100). She takes cardizem twice daily. 03/15/18-She is here in follow-up evaluation for left great toe ulcer. She remains noncompliant with glucose control and smoking cessation. She admits to compliance with wearing offloading shoe. The ulcer is improved/stable and we will continue with the same treatment plan and she will follow-up  next week 03/22/18-She is here for evaluation for left great toe ulcer. There continues to be significant improvement despite recurrent hyperglycemia (over 500 yesterday) and she continues to smoke. She has been compliant with offloading and we will continue with same treatment plan and she will follow-up next week. 03/29/18-She is here for evaluation for left great toe ulcer. Despite continuing to smoke and uncontrolled diabetes she continues to improve. She is compliant with offloading shoe. We will continue with the same treatment plan and she will follow-up next week 04/05/18- She is here in follow up evaluation for [name] left great toe ulcer; she presents with small pustule to left fifth toe (resembles ant bite). She admits to compliance with wearing offloading shoe; continues to smoke or have uncontrolled blood glucose control. There is more callus than usual with evidence of bleeding; she denies known trauma. 04/12/18-She is here for evaluation of left great toe ulcer. Despite noncompliance with glycemic control and smoking she continues to make Fero, Joanann [name]. ([id number]) improvement. She continues to wear offloading shoe. The pustule, that was identified last week, to the left fifth toe is resolved. She will follow-up in 2 weeks 05/03/18-she is seen in follow-up evaluation for [name] left great toe ulcer. She is compliant with offloading, otherwise noncompliant with glycemic control and smoking. She has plateaued and there is minimal improvement noted. We will transition to [facility], replaced the insert to her surgical shoe and she will  follow-up in one week 05/10/18- She is here in follow up evaluation for [name] left great toe ulcer. It appears stable despite measurement change. We will continue with same treatment plan and follow up next week. 05/24/18-She is seen in follow-up evaluation for [name] left great toe ulcer. She remains compliant with offloading, has made significant improvement in her diet, decreasing the amount of sugar/soda. She said her recent A1c was 10.9 which is lower than. She did see [name] diabetic nutritionist/educator yesterday. She continues to smoke. We will continue with the same treatment plan and she'll follow-up next week. 05/31/18- She is seen in follow-up evaluation for left great toe ulcer. She continues to remain compliant with offloading, continues to make improvement in her diet, increasing her water and decreasing the amount of sugar/soda. She does continue to smoke with no desire to quit. We will apply Prisma to the depth and Hydrofera Blue over. We have not received insurance authorization for oasis. She will follow up next week. 06/07/18-She is seen in follow-up evaluation for left great toe ulcer. It has stalled according to today's measurements although base appears stable. She says she saw [name] diabetic educator yesterday; her average blood sugars are less than 300 which is an improvement for her. She continues to smoke and states "that's my next step" She continues with water over soda. We will order for xray, culture and reinstate ace wrap compression prior to placing apligraf for next week. She is voicing no complaints or concerns. Her dressing will change to iodoflex over the next week in preparation for apligraf. 06/14/18-She is seen in follow-up evaluation for left great toe ulcer. Plain film x-ray performed last week was negative for osteomyelitis. Wound culture obtained last week grew strep B and OSSA; she is initiated on keflex and cefdinir today; there is erythema to the toe which could be from ace  wrap compression, she has [name] history of wrapping too tight and has has been encouraged to maintain ace wraps that we place today. We will hold off on application of apligraf  today, will apply next week after antibiotic therapy has been initiated. She admits today that she has resumed taking [name] shower with her foot/toe submerged in water, she has been reminded to keep foot/toe out of the bath water. She will be seen in follow up next week 06/21/18-she is seen in follow-up evaluation for left great toe ulcer. She is tolerating antibiotic therapy with no GI disturbance. The wound is stable. Apligraf was applied today. She has been decreasing her smoking, only had 4 cigarettes yesterday and 1 today. She continues being more compliant in diabetic diet. She will follow-up next week for evaluation of site, if stable will remove at 2 weeks. 06/28/18- She is here in follow up evalution. Apligraf was placed last week, she states the dressing fell off on Tuesday and she was dressing with hydrofera blue. She is healed and will be discharged from the clinic today. She has been instructed to continue with smoking cessation, continue monitoring glucose levels, offloading for an additional 4 weeks and continue with hydrofera blue for additional two weeks for any possible microscopic opening. Readmission: 08/07/18 on evaluation today patient presents for reevaluation concerning the ulcer of her right great toe. She was previously discharged on 06/28/18 healed. Nonetheless she states that this began to show signs of drainage she subsequently went to her primary care provider. Subsequently an x-ray was performed on 08/01/18 which was negative. The patient was also placed on antibiotics at that time. Fortunately they should have been effective for the infection. Nonetheless she's been experiencing some improvement but still has [name] lot of drainage coming from the wound itself. 08/14/18 on evaluation today patient's wound  actually does show signs of improvement in regard to the erythema at this point. She has completed the antibiotics. With that being said we did discuss the possibility of placing her in [name] total contact cast as of today although I think that I may want to give this just [name] little bit more time to ensure nothing recurrence as far as her infection is concerned. I do not want to put in the cast and risk infection at that time if things are not completely resolved. With that being said she is gonna require some debridement today. 08/21/18 on evaluation today patient actually appears to be doing okay in regard to her toe ulcer. She's been tolerating the dressing changes without complication. With that being said it does appear that she is ready and in fact I think it's appropriate for [place] to go ahead and initiate the total contact cast today. Nonetheless she will require some sharp debridement to prepare the wound for application. Overall I feel like things have been progressing well but we do need to do something to get this to close more readily. 08/24/18 patient seen today for reevaluation after having had the total contact cast applied on Tuesday. She seems to have done very well the wound appears to be doing great and overall I'm pleased with the progress that she's made. There were no abnormal areas of rubbing from the cast on her lower extremity. 08/30/18 on evaluation today patient actually appears to be completely healed in regard to her plantar toe ulcer. She tells me at this point she's been having [name] lot of issues with the cast. She almost fell [name] couple of times the state shall the step of her dog [name] couple times as well. This is been [name] very frustrating process for her other nonetheless she has completely healed the wound which is excellent  news. Overall there does not appear to be the evidence of infection at this time which is great news. 09/11/18 evaluation today patient presents for follow-up  concerning her great toe ulcer on the left which has unfortunately reopened since I last saw her which was only [name] couple of weeks ago. Unfortunately she was not able to get in to get the shoe and potentially the AFO that's gonna be necessary due to her left foot drop. She continues with offloading shoe but this is not enough to prevent her from reopening it appears. When we last had her in the total contact cast she did well from [name] healing standpoint but unfortunately the wound reopened as soon as she came out of the cast within just [name] couple of weeks. 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 This is also part of the reason why she falls on [name] regular basis. I just do not believe that is gonna be safe for [place] to reinitiate the total contact cast as last time we had this on she fell 3 times one week which is definitely not normal for her. 09/18/18 upon evaluation today the patient actually appears to be doing about the same in regard to her toe ulcer. She did not contact [facility] as I asked her to even though I had given her the prescription. In fact she actually states that she has no idea where the prescription is. She did apparently call [facility] and they told her that all she needed to do was bring the prescription in order to be able to be seen and work on getting the AFO for her left foot. With all that being said she still does not have an appointment and I'm not sure were things stand that regard. I will give her [name] new prescription today in order to contact them to get this set up. 09/25/18 on evaluation today patient actually appears to be doing about the same in regard to her toes ulcer. She does have [name] small areas which seems to have [name] lot of callous buildup around the edge of the wound which is  going to need sharp debridement today. She still is waiting to be scheduled for evaluation with [facility] for possibility of an AFO. She states there supposed to call her tomorrow to get this set up. Unfortunately it does appear that her foot specifically the toe area is showing signs of erythema. There does not appear to be any systemic infection which is in these good news. CAYDENCE, KOENIG [name]. ([id number]) 10/02/18 on evaluation today patient actually appears to be doing about the same in regard to her toe ulcer. This really has not done too well although it's not significantly larger it's also not significantly smaller. She has been tolerating the dressing changes without complication. She actually has her appointment with [facility] and [place] tomorrow to hopefully be measured for obtaining and AFO splint. I think this would be helpful preventing this from reoccurring. We had contemplated starting the cast this week although to be honest I am reluctant to do that as she's been having nausea, vomiting, and seizure activity over the past three days. She has [name] history of seizures and have been told is nothing that can be done for these. With that being said I  do believe that along with the seizures have the nausea vomiting which upon further questioning doesn't seem to be the normal for her and makes me concerned for the possibility of infection or something else going on. I discussed this with the patient and her mother during the office visit today. I do not feel the wound is effective but maybe something else. The responses this was "this just happens to her at times and we don't know why". They did not seem to be interested in going to the hospital to have this checked out further. 10/09/18 on evaluation today patient presents for follow-up concerning her ongoing toe ulcer. She has been tolerating the dressing changes without complication. Fortunately there does not appear to be any evidence of  infection which is great news however I do think that the patient would benefit from going ahead for with the total contact cast. She's actually in [name] wheelchair today she tells me that she will use her walker if we initiate the cast. I was very specific about the fact that if we were gonna do the cast I wanted to make sure that she was using the walker in order to prevent any falls. She tells me she does not have stairs that she has to traverse on [name] regular basis at her home. She has not had any seizures since last week again that something that happens to her often she tells me she did talk to [employer] and they said that it may take up to three weeks to get the brace approved for her. Hopefully that will not take that long but nonetheless in the meantime I do think the cast could be of benefit. 10/12/18 on evaluation today patient appears to be doing rather well in regard to her toe ulcer. It's just been [name] few days and already this is significantly improved both as far as overall appearance and size. Fortunately there's no sign of infection. She is here for her first obligatory cast change. 10/19/18 Seen today for follow up and management of left great toe ulcer. Wound continues to show improvement. Noted small open area with seroussang drainage with palpation. Denies any increased pain or recent fevers during visit. She will continue calcium alginate with offloading shoe. Denies any questions or concerns during visit. 10/26/18 on evaluation today patient appears to be doing about the same as when I last saw her in regard to her wound bed. Fortunately there does not appear to be any signs of infection. Unfortunately she continues to have [name] breakdown in regard to the toe region any time that she is not in the cast. It takes almost no time at all for this to happen. Nonetheless she still has not heard anything from the brace being made by [employer] as to when exactly this will be available to her.  Fortunately there is no signs of infection at this time. 10/30/18 on evaluation today patient presents for application of the total contact cast as we just received him this morning. Fortunately we are gonna be able to apply this to her today which is great news. She continues to have no significant pain which is good news. Overall I do feel like things have been improving while she was the cast is when she doesn't have [name] cast that things get worse. She still has not really heard anything from [facility] regarding her brace. 11/02/18 upon evaluation today patient's wound already appears to be doing significantly better which is good news. Fortunately there does not  appear to be any signs of infection also good news. Overall I do think the total contact cast as before is helping to heal this area unfortunately it's just not gonna likely keep the area closed and healed without her getting her brace at least. Again the foot drop is [name] significant issue for her. 11/09/18 on evaluation today patient appears to be doing excellent in regard to her toe ulcer which in fact is completely healed. Fortunately we finally got the situation squared away with the paperwork which was needed to proceed with getting her brace approved by Medicaid. I have filled that out unfortunately that information has been sent to the orthopedic office that I worked at 2 1/2 years ago and not tired Current wound care measures. Fortunately she seems to be doing very well at this time. 11/23/18 on evaluation today patient appears to be doing More Poorly Compared to Last Time I Saw Her. At [facility] She Had Completely Healed. Currently she is continuing to have issues with reopening. She states that she just found out that the brace was approved through Medicaid now she just has to go get measured in order to have this fitted for her and then made. Subsequently she does not have an appointment for this yet that is going to complicate things we  obviously cannot put her back in the cast if we do not have everything measured because they're not gonna be able to measure her foot while she is in the cast. 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 Obviously this is unfortunate and does have me somewhat worried as well. 11/30/18 on evaluation today patient's toe ulcer actually appears to be doing fairly well. The good news is she will be getting her brace in the shoes next week on Wednesday. Hopefully we will be able to get this to heal without having to go back in the cast however she may need the cast in order to get the wound completely heal and then go from there. Fortunately there's no signs of infection at this time. 12/07/18 on evaluation today patient fortunately did receive her brace and she states she could tell this definitely makes her walk better. With that being said she's been having issues with her toe where she noticed yesterday there was [name] lot of tissue that was loosing off this appears to be much larger than what it was previous. She also states that her leg has been read putting much across the top of her foot just about the ankle although this seems to be receiving somewhat. The total area is still red and appears to be someone infected as best I can tell. She is previously taken Bactrim and that may be [name] good option for her today as well. We are gonna see what I wound culture shows as well and I think that this is definitely appropriate. With that being said outside of the culture I still need to initiate something in the interim and that's what I'm gonna go ahead and select Bactrim is [name] good option for her. 12/14/18 on evaluation today patient appears to be doing better in regard to her left great toe ulcer as compared to last week's evaluation. There's still some erythema although this is significantly improved which is excellent  news. Overall I do believe that she is making good progress is still gonna take some time before  she is where I would like her to be from the standpoint of being able to place her back into the total contact cast. Hopefully we will be where we need to be by next week. 12/21/18 on evaluation today patient actually appears to be doing poorly in regard to her toe ulcer. She's been tolerating the dressing changes without complication. Fortunately there's no signs of systemic infection although she does have [name] lot of drainage from the toe ulcer and this does seem to be causing some issues at this point. She does have erythema on the distal portion of her toe that appears to be likely cellulitis. 12/28/18 on evaluation today patient actually appears to be doing [name] little better in my pinion in regard to her toe ulcer. With that being said she still does have some evidence of infection at this time and for her culture she had both E. coli as well as enterococcus as organisms noted on evaluation. For that reason I think that though the Keflex likely has treated the E. coli rather well this has really done nothing for the enterococcus. We are going to have to initiate treatment for this specifically. VONDA, HARTH [name]. ([id number]) 01/04/19 on evaluation today patient's toe actually appears to be doing better from the standpoint of infection. She currently would like to see about putting the cash back on I think that this is appropriate as long as she takes care of it and keeps it from getting wet. She is gonna have some drainage we can definitely pass this up with Drawtex and alginate to try to prevent as much drainage as possible from causing the problems. With that being said I do want to at least try her with the cast between now and Tuesday. If there any issues we can't continue to use it then I will discontinue the use of the cast at that point. 01/08/19 on evaluation today patient actually appears to be doing  very well as far as her foot ulcer specifically the great toe on the left is concerned. She did have an area of rubbing on the medial aspect of her left ankle which again is from the cast. Fortunately there's no signs of infection at this point in this appears to be [name] very slight skin breakdown. The patient tells me she felt it rubbing but didn't think it was that bad. Fortunately there is no signs of active infection at this time which is good news. No fevers, chills, nausea, or vomiting noted at this time. 01/15/19 on evaluation today patient actually appears to be doing well in regard to her toe ulcer. Again as previous she seems to do well and she has the cast on which indicates to me that during the time she doesn't have [name] cast on she's putting way too much pressure on this region. Obviously I think that's gonna be an issue as with the current national emergency concerning the Covid-19 Virus it has been recommended that we discontinue the use of total contact casting by the chief medical officer of our company, Dr. Simona Huh. The reasoning is that if [name] patient becomes sick and cannot come into have the cast removed they could not just leave this on for an additional two weeks. Obviously the hospitals also do not want to receive patient's who are sick into the emergency department to potentially contaminate the region and spread the Covid-19 Virus among other sick individuals within the hospital system. Therefore at this point we are suspending the use of  total contact cast until the current emergency subsides. This was all discussed with the patient today as well. 01/22/19 on evaluation today patient's wound on her left great toe appears to be doing slightly worse than previously noted last week. She tells me that she has been on this quite [name] bit in fact she tells me she's been awake for 38 straight hours. This is due to the fact that she's having to care for grandparents because nobody else will. She  has been taking care of them for five the last seven days since I've seen her they both have dementia his is from [name] stroke and her grandmother's was progressive. Nonetheless she states even her mom who knows her condition and situation has only help two of those days to take care of them she's been taking care of the rest. Fortunately there does not appear to be any signs of active infection in regard to her toe at this point although obviously it doesn't look as good as it did previous. I think this is directly related to her not taking off the pressure and friction by way of taking things easy. Though I completely understand what's going on. 01/29/19 on evaluation today patient's tools are actually appears to be showing some signs of improvement today compared to last week's evaluation as far as not necessarily the overall size of the wound but the fact that she has some new skin growth in between the two ends of the wound opening. Overall I feel like she has done well she states that she had [name] family member give her what sounds to be [name] CAM walker boot which has been helpful as well. 02/05/19 on evaluation today patient's wound bed actually appears to be doing significantly better in regard to her overall appearance of the size of the wound. With that being said she is still having an issue with offloading efficiently enough to get this to close. Apparently there is some signs of infection at this point as well unfortunately. Previously she's done well of Augmentin I really do not see anything that needs to be culture currently but there theme and cellulitis of the foot that I'm seeing I'm gonna go ahead and place her on an antibiotic today to try to help clear this up. 02/12/2019 on evaluation today patient actually appears to be doing poorly in regard to her overall wound status. She tells me she has been using her offloading shoe but actually comes in today wearing her tennis shoe with the AFO brace.  Again as I previously discussed with her this is really not sufficient to allow the area to heal appropriately. Nonetheless she continues to be somewhat noncompliant and I do wonder based on what she has told my nurse in the past as to whether or not [name] good portion of this noncompliance may be recreational drug and alcohol related. She has had [name] history of heroin overdose and this was fairly recently in the past couple of months that have been seeing her. Nonetheless overall I feel like her wound looks significantly worse today compared to what it was previous. She still has significant erythema despite the Augmentin I am not sure that this is an appropriate medication for her infection I am also concerned that the infection may have gone down into her bone. 02/19/19 on evaluation today patient actually appears to be doing about the same in regard to her toe ulcer. Unfortunately she continues to show signs of bone exposure and infection at  this point. There does not appear to be any evidence of worsening of the infection but I'm also not really sure that it's getting significantly better. She is on the Augmentin which should be sufficient for the Staphylococcus aureus infection that she has at this point. With that being said she may need IV antibiotics to more appropriately treat this. We did have [name] discussion today about hyperbaric option therapy. 02/28/19 on evaluation today patient actually appears to be doing much worse in regard to the wound on her left great toe as compared to even my previous evaluation last week. Unfortunately this seems to be training in [name] pretty poor direction. Her toe was actually now starting to angle laterally and I can actually see the entire joint area of the proximal portion of the digit where is the distal portion of the digit again is no longer even in contact with the joint line. Unfortunately there's [name] lot more necrotic tissue around the edge and the toe appears to be  showing signs of becoming gangrenous in my pinion. I'm very concerned about were things stand at this point. She did see infectious disease and they are planning to send in [name] prescription for Sivextro for her and apparently this has been approved. With that being said I don't think she should avoid taking this but at the same time I'm not sure that it's gonna be sufficient to save her toe at this point. She tells me that she still having to care for grandparents which I think is putting quite [name] bit of strain on her foot and specifically the total area and has caused this to break down even to [name] greater degree than would've otherwise been expected. 03/05/19 on evaluation today patient actually appears to be doing quite well in regard to her toe all things considering. She still has bone exposed but there appears to be much less your thing on overall the appearance of the wound and the toe itself is dramatically improved. She still does have some issues currently obviously with infection she did see vascular as well and there concerned that her blood flow to the toad. For that reason they are setting up for an angiogram next week. 03/14/19 on evaluation today patient appears to be doing very poor in regard to her toe and specifically in regard to the ulceration and the fact that she's starting to notice the toe was leaning even more towards the lateral aspect and the complete joint is visible on the proximal aspect of the joint. Nonetheless she's also noted [name] significant odor and the tip of the toe is turning more dark and necrotic appearing. Overall I think she is getting worse not better as far as this is concerned. For that reason I am recommending at this point that she likely needs to be seen for likely amputation. READMISSION 03/19/2021 This is [name] patient that we cared for in this clinic for [name] prolonged period of time in 2019 and 2020 with [name] left foot and left first toe wound. I believe she  ultimately became infected and underwent [name] left first toe amputation. 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 ([id number]) 04/09/20 by Dr. Luana Shu. In December 2021 she had an ulcer on her right great toe as well as the fourth and fifth toes. 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 She also had an angiogram at that time and underwent angioplasty of the right  anterior tibial artery. In any case she claims that the wound on the right foot is closed I did not look at this today which was probably an oversight although I think that should be done next week. 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 According to Dr. Deborra Medina last review that she was out of the area being cared for by another physician but recently came back to his attention. 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 [name] culture of this area showed E. coli apparently before she came back to see Dr. Luana Shu she was supposed to be receiving antibiotics but she did not really take them. Nor is she offloading this area at all. Finally her last hemoglobin A1c listed in epic was in March 2022 at 14.1 she says things are [name] lot better since then although I am not sure. 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 She had this wound at the time. During this admission she had E. coli sepsis [name] TEE was negative. 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 She is still [name] heavy cigarette smoker. She has [name] history of bipolar disorder seizure disorder ABI in our clinic was 1.05 6/1; left midfoot  in the setting of [name] TMA done previously. Round circular wound with [name] "knuckle" of protruding tissue. The problem is that the knuckle was not attached to any of the surrounding granulation and this probed proximally widely I removed [name] large portion of this tissue. This wound goes with considerable undermining laterally. I do not feel any bone there was no purulence but this is [name] deep wound. 6/8; in spite of the debridement I did last week. She arrives with [name] wound looking exactly the same. [name] protruding "knuckle" of tissue nonadherent to most of the surrounding tissue. There is considerable depth around this from 6-12 o'clock at 2.7 cm and undermining of 1 cm. This does not look overtly infected and the x-ray I did last week was negative for any osseous abnormalities. We have been using silver collagen 6/15; deep tissue culture I did last week showed moderate staph aureus and moderate Pseudomonas. This will definitely require prolonged antibiotic therapy. The pathology on the protuberant area was negative for malignancy fungus etc. 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 We have been using silver collagen. I am going to be prescribing Levaquin for 2 weeks. Her CT scan of the foot is down for 7/5 6/22; CT scan of the foot on 7 5. She says she has hardware in the left leg from her previous fracture. She is on the Levaquin for the deep tissue culture I did that showed methicillin sensitive staph aureus and Pseudomonas. I gave her [name] 2-week supply and she will have another week. She arrives in clinic today with the same protuberant tissue however this is nonadherent to the tissue surrounding it. I am really at [name] loss to explain this unless there is underlying deep tissue infection 6/29; patient presents for 1 week follow-up. She has been using collagen to the wound bed. She reports taking her antibiotics as prescribed.She has no complaints or  issues today. She denies signs  of infection. 7/6; patient presents for one week followup. She has been using collagen to the wound bed. She states she is taking Levaquin however at times she is not able to keep it down. She denies signs of infection. 7/13; patient presents for 1 week follow-up. She has been using silver alginate to the wound bed. She still has nausea when taking her antibiotics. She denies signs of infection. 7/20; patient presents for 1 week follow-up. She has been using silver alginate with gentamicin cream to the wound bed. She denies any issues and has no complaints today. She denies signs of infection. 7/27; patient presents for 1 week follow-up. She continues to use silver alginate with gentamicin cream to the wound bed. She reports starting her antibiotics. She has no issues or complaints. Overall she reports stability to the wound. 8/3; patient presents for 1 week follow-up. She has been using silver alginate with gentamicin cream to the wound bed. She reports completing all antibiotics. She has no issues or complaints today. She denies signs of infection. 8/17; patient presents for 2-week follow-up. He is to use silver alginate to the wound bed. She has no issues or complaints today. She denies signs of infection. She reports her pain has improved in her foot since last clinic visit 8/24; patient presents for 1 week follow-up. She continues to use silver alginate to the wound bed. She has no issues or complaints. She denies signs of infection. Pain is stable. 9/7; patient presents for follow-up. She missed her last week appointment due to feeling ill. She continues to use silver alginate. She has [name] new wound to the right lower extremity that is covered in eschar. She states It occurred over the past week and has no idea how it started. She currently denies signs of infection. 9/14; patient presents for follow-up. To the left foot wound she has been using gentamicin cream  and silver alginate. To the right lower extremity wound she has been keeping this covered and has not obtain Santyl. 9/21; patient presents for follow-up. She reports using gentamicin cream and silver alginate to the left foot and Santyl to the right lower extremity wound. She has no issues or complaints today. She denies signs of infection. 9/28; patient presents for follow-up. She reports [name] new wound to her right heel. She states this occurred [name] few days ago and is progressively gotten worse. She has been trying to clean the area with [name] Q-tip and Santyl. She reports stability in the other 2 wounds. She has been using gentamicin cream and silver alginate to the left foot and Santyl to the right lower extremity wound. 10/12; patient presents for follow-up. She reports improvement to the wound beds. She is seeing vein and vascular to discuss the potential of [name] left BKA. She states they are going to do an arteriogram. She continues to use silver alginate with dressing changes to her wounds. 11/2; patient presents for follow-up. She states she has not been doing dressing changes to the wound beds. She states she is not able to offload the areas. She reports chronic pain to her left foot wound. 11/9; patient presents for follow-up. She came in with only socks on. She states she forgot to put on shoes. It is unclear if she is doing any dressing changes. She currently denies systemic signs of infection. 11/16; patient presents for follow-up. She came again only with socks on. She states she does not wear shoes ever. It is unclear if she does dressing changes.  She currently denies systemic signs of infection. 11/23; patient presents for follow-up. She wore her shoes today. It still unclear exactly what dressing she is using for each wound but she did states she obtained Dakin's solution and has been using this to the left foot wound. She currently denies signs of infection. 11/30; patient presents for  follow-up. She has no issues or complaints today. She currently denies signs of infection. 12/7; patient presents for follow-up. She has no issues or complaints today. She has been using Hydrofera Blue to the right heel wound and Dakin solution to the left foot wound. Her right anterior leg wound is healed. She currently denies signs of infection. BREELYN, ICARD [name]. ([id number]) 12/14; patient presents for follow-up. She has been using Hydrofera Blue to the right heel and Dakin's to the left foot wounds. She has no issues or complaints today. She denies signs of infection. 12/21; patient presents for follow-up. She reports using Hydrofera Blue to the right heel and Dakin's to the left foot wound. She denies signs of infection. 12/28; patient presents for follow-up. She continues to use Dakin's to the left foot wound and Hydrofera Blue to the right heel wound. She denies signs of infection. 1/4; patient presents for follow-up. She has no issues or complaints today. She denies signs of infection. 1/11; patient presents for follow-up. It is unclear if she has been dressing these wounds over the past week. She currently denies signs of infection. 1/18; patient presents for follow-up. She states she has been using Dakin's wet-to-dry dressings to the left foot. She has been using Hydrofera Blue to the right foot foot wound. She states that the anterior right leg wound has reopened and draining serous fluid. She denies signs of infection. 1/25; patient presents for follow-up. She has no issues or complaints today. Electronic Signature(s) Signed: 11/17/2021 11:44:49 AM By: Kalman Shan DO Entered By: Kalman Shan on 11/17/2021 11:37:50 [name] ([id number]) -------------------------------------------------------------------------------- Physical Exam Details Patient Name: [name] Date of Service: 11/17/2021 10:30 AM Medical Record Number: [id number] Patient Account Number:  [id number] Date of Birth/Sex: May 31, 1976 (46 y.o. F) Treating RN: Levora Dredge Primary Care Provider: Raelene Bott Other Clinician: Referring Provider: Raelene Bott Treating Provider/Extender: Yaakov Guthrie in Treatment: 59 Constitutional . Cardiovascular . Psychiatric . Notes Left foot (Transmetatarsal amputation): Wound to the plantar aspect that has granulation tissue with nonviable tissue and increased pockets of depth. There is circumferential callus. Right foot: To the heel there is an open wound with Granulation tissue , And scant nonviable tissue with Circumferential callus. No obvious signs of infection to any wound. Right lower extremity: To the anterior aspect there is an open wound with dried nonviable tissue tightly adhered Electronic Signature(s) Signed: 11/17/2021 11:44:49 AM By: Kalman Shan DO Entered By: Kalman Shan on 11/17/2021 11:42:08 [name] ([id number]) -------------------------------------------------------------------------------- Physician Orders Details Patient Name: [name] Date of Service: 11/17/2021 10:30 AM Medical Record Number: [id number] Patient Account Number: [id number] Date of Birth/Sex: Feb 11, 1976 (46 y.o. F) Treating RN: Levora Dredge Primary Care Provider: Raelene Bott Other Clinician: Referring Provider: Raelene Bott Treating Provider/Extender: Yaakov Guthrie in Treatment: 80 Verbal / Phone Orders: No Diagnosis Coding Follow-up Appointments o Return Appointment in 1 week. Bathing/ Shower/ Hygiene o Wash wounds with antibacterial soap and water. Off-Loading Left Lower Extremity o Open toe surgical shoe with peg assist. o Turn and reposition every 2 hours o Other: - peg shoe right, off-loader left Medications-Please add to medication list. o P.O. Antibiotics   o Other: - per MD [place] ok to leave right lower leg dressing on for 3-4 days and then continue daily Wound  Treatment Wound #10 - Lower Leg Wound Laterality: Right, Midline Topical: Santyl Collagenase Ointment, 30 (gm), tube 1 x Per Day/30 Days Discharge Instructions: apply nickel thick to wound bed only Primary Dressing: Zetuvit Plus Silicone Border Dressing 4x4 (in/in) 1 x Per Day/30 Days Wound #7 - Foot Wound Laterality: Plantar, Left, Midline Cleanser: Dakin 16 (oz) 0.25 1 x Per Day/30 Days Discharge Instructions: Use as directed. Primary Dressing: Gauze 1 x Per Day/30 Days Discharge Instructions: As directed: moistened with Dakins Solution Secondary Dressing: Kerlix 4.5 x 4.1 (in/yd) 1 x Per Day/30 Days Discharge Instructions: Apply Kerlix 4.5 x 4.1 (in/yd) as instructed Secured With: Kerlix Roll Sterile or Non-Sterile 6-ply 4.5x4 (yd/yd) 1 x Per Day/30 Days Discharge Instructions: Apply Kerlix as directed Wound #9 - Calcaneus Wound Laterality: Right Cleanser: Soap and Water 1 x Per Day/30 Days Discharge Instructions: Gently cleanse wound with antibacterial soap, rinse and pat dry prior to dressing wounds Primary Dressing: Hydrofera Blue Ready Transfer Foam, 2.5x2.5 (in/in) 1 x Per Day/30 Days Discharge Instructions: Apply Hydrofera Blue Ready to wound bed as directed Secondary Dressing: Zetuvit Plus Silicone Border Dressing 4x4 (in/in) 1 x Per Day/30 Days Secured With: coverlet 1 x Per Day/30 Days Electronic Signature(s) Signed: 11/17/2021 11:44:49 AM By: Kalman Shan DO Entered By: Kalman Shan on 11/17/2021 11:44:03 Bluitt, Murphy [name]. ([id number]) [name], Illyria [name]. ([id number]) -------------------------------------------------------------------------------- Problem List Details Patient Name: [name] Date of Service: 11/17/2021 10:30 AM Medical Record Number: [id number] Patient Account Number: [id number] Date of Birth/Sex: Jun 25, 1976 (46 y.o. 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 03/19/2021 No Yes severity L97.512 Non-pressure chronic ulcer of other part of right foot with fat layer 09/15/2021 No Yes exposed L97.812 Non-pressure chronic ulcer of other part of right lower leg with fat layer 09/15/2021 No Yes exposed E11.621 Type 2 diabetes mellitus with foot ulcer 03/19/2021 No Yes E11.42 Type 2 diabetes mellitus with diabetic polyneuropathy 03/19/2021 No Yes L97.811 Non-pressure chronic ulcer of other part of right lower leg limited to 11/10/2021 No Yes breakdown of skin Inactive Problems ICD-10 Code Description Active Date Inactive Date S81.801A Unspecified open wound, right lower leg, initial encounter 06/30/2021 06/30/2021 Resolved Problems Electronic Signature(s) Signed: 11/17/2021 11:44:49 AM By: Kalman Shan DO Entered By: Kalman Shan on 11/17/2021 11:37:22 Krummel, Delrose [name]. ([id number]) -------------------------------------------------------------------------------- Progress Note Details Patient Name: [name] Date of Service: 11/17/2021 10:30 AM Medical Record Number: [id number] Patient Account Number: [id number] Date of Birth/Sex: Mar 18, 1976 (46 y.o. F) Treating RN: Levora Dredge Primary Care Provider: Raelene Bott Other Clinician: Referring Provider: Raelene Bott Treating Provider/Extender: Yaakov Guthrie in Treatment: 27 Subjective Chief Complaint Information obtained from Patient 03/19/2021; patient referred by Dr. Luana Shu who has been looking after her left foot for quite [name] period of time for review of [name] nonhealing area in the left midfoot History of Present Illness (HPI) 01/18/18-She is here for initial evaluation of the left great toe ulcer. She is [name] poor historian in regards to timeframe  in detail. She states approximately 4 weeks ago she lacerated her toe on something in the house. She followed up with her primary care who placed her on  Bactrim and ultimately [name] second dose of Bactrim prior to coming to wound clinic. She states she has been treating the toe with peroxide, Betadine and [name] Band-Aid. She did not check her blood sugar this morning but checked it yesterday morning it was 327; she is unaware of [name] recent A1c and there are no current records. She saw Dr. she would've orthopedics last week for an old injury to the left ankle, she states he did not see her toe, nor did she bring it to his attention. She smokes approximately 1 pack cigarettes [name] day. Her social situation is concerning, she arrives this morning with her mother who appears extremely intoxicated/under the influence; her mother was asked to leave the room and be monitored by the patient's grandmother. The patient's aunt then accompanied the patient and the room throughout the rest of the appointment. We had [name] lengthy discussion regarding the deleterious effects of uncontrolled hyperglycemia and smoking as it relates to wound healing and overall health. She was strongly encouraged to decrease her smoking and get her diabetes under better control. She states she is currently on [name] diet and has cut down her [facility] consumption. The left toe is erythematous, macerated and slightly edematous with malodor present. The edema in her left foot is below her baseline, there is no erythema streaking. We will treat her with Santyl, doxycycline; we have ordered and xray, culture and provided [name] Peg assist surgical shoe and cultured the wound. 01/25/18-She is here in follow-up evaluation for [name] left great toe ulcer and presents with an abscess to her suprapubic area. She states her blood sugars remain elevated, feeling "sick" and if levels are below 250, but she is trying. She has made no attempt to decrease her smoking stating that  we "can't take away her food in her cigarettes". She has been compliant with offloading using the PEG assist you. She is using Santyl daily. the culture obtained last week grew staph aureus and Enterococcus faecalis; continues on the doxycycline and Augmentin was added on Monday. The suprapubic area has erythema, no femoral variation, purple discoloration, minimal induration, was accessed with [name] cotton tip applicator with sanguinopurulent drainage, this was cultured, I suspect the current antibiotic treatment will cover and we will not add anything to her current treatment plan. She was advised to go to urgent care or ER with any change in redness, induration or fever. 02/01/18-She is here in follow-up evaluation for left great toe ulcers and [name] new abdominal abscess from last week. She was able to use packing until earlier this week, where she "forgot it was there". She states she was feeling ill with GI symptoms last week and was not taking her antibiotic. She states her glucose levels have been predominantly less than 200, with occasional levels between 200-250. She thinks this was contributing to her GI symptoms as they have resolved without intervention. There continues to be significant laceration to left toe, otherwise it clinically looks stable/improved. There is now less superficial opening to the lateral aspect of the great toe that was residual blister. We will transition to [facility] to all wounds, she will continue her Augmentin. If there is no change or deterioration next week for reculture. 02/08/18-She is here in follow-up evaluation for left great toe ulcer and abdominal ulcer. There is an improvement in both wounds. She has been wrapping her left toe with coban, not by our direction, which has created an area of discoloration to the medial aspect; she has been  advised to NOT use coban secondary to her neuropathy. She states her glucose levels have been high over this last week  ranging from 200-350, she continues to smoke. She admits to being less compliant with her offloading shoe. We will continue with same treatment plan and she will follow-up next week. 02/15/18-She is here in follow-up evaluation for left great toe ulcer and abdominal ulcer. The abdominal ulcer is epithelialized. The left great toe ulcer is improved and all injury from last week using the Coban wrap is resolved, the lateral ulcer is healed. She admits to noncompliance with wearing offloading shoe and admits to glucose levels being greater than 300 most of the week. She continues to smoke and expresses no desire to quit. There is one area medially that probes deeper than it has historically, erythema to the toe and dorsal foot has consistently waxed and waned. There is no overt signs of cellulitis or infection but we will culture the wound for any occult infection given the new area of depth and erythema. We will hold off on sensitivities for initiation of antibiotic therapy. 02/22/18-She is here in follow up evaluation for left great toe ulcer. There is overall significant improvement in both wound appearance, erythema and edema with changes made last week. She was not initiated on antibiotic therapy. Culture obtained last week showed oxacillin sensitive staph aureus, sensitive to clindamycin. Clindamycin has been called into the pharmacy but she has been instructed to hold off on initiation secondary to overall clinical improvement and her history of antibiotic intolerance. She has been instructed to contact the clinic with any noted changes/deterioration and the wound, erythema, edema and/or pain. She will follow-up next week. She continues to smoke and her glucose levels remain elevated >250; she admits to compliance with offloading shoe 03/01/18 on evaluation today patient appears to be doing fairly well in regard to her left first toe ulcer. She has been tolerating the dressing changes with the  [facility] Dressing without complication and overall this has definitely showed signs of improvement according to records as well is what the patient tells me today. I'm very pleased in that regard. She is having no pain today 03/08/18 She is here for follow up evaluation of [name] left great toe ulcer. She remains non-compliant with glucose control and smoking cessation; glucose levels consistently >200. She states that she got new shoe inserts/peg assist. She admits to compliance with offloading. Since my last evaluation there is significant improvement. We will switch to prisma at this time and she will follow up next week. She is noted to be tachycardic at this appointment, heart rate 120s; she has [name] history of heart rate 70-130 according to our records. She admits to extreme agitation r/t personal issues; she was advised to monitor her heartrate and contact her physician if it does not return to [name] more normal range (<100). She takes cardizem twice daily. 03/15/18-She is here in follow-up evaluation for left great toe ulcer. She remains noncompliant with glucose control and smoking cessation. She admits to compliance with wearing offloading shoe. The ulcer is improved/stable and we will continue with the same treatment plan and she will follow-up next week 03/22/18-She is here for evaluation for left great toe ulcer. There continues to be significant improvement despite recurrent hyperglycemia (over 500 yesterday) and she continues to smoke. She has been compliant with offloading and we will continue with same treatment plan and she will follow-up next week. JASHAWNA, REEVER ([id number]) 03/29/18-She is here for evaluation  for left great toe ulcer. Despite continuing to smoke and uncontrolled diabetes she continues to improve. She is compliant with offloading shoe. We will continue with the same treatment plan and she will follow-up next week 04/05/18- She is here in follow up evaluation for [name] left  great toe ulcer; she presents with small pustule to left fifth toe (resembles ant bite). She admits to compliance with wearing offloading shoe; continues to smoke or have uncontrolled blood glucose control. There is more callus than usual with evidence of bleeding; she denies known trauma. 04/12/18-She is here for evaluation of left great toe ulcer. Despite noncompliance with glycemic control and smoking she continues to make improvement. She continues to wear offloading shoe. The pustule, that was identified last week, to the left fifth toe is resolved. She will follow-up in 2 weeks 05/03/18-she is seen in follow-up evaluation for [name] left great toe ulcer. She is compliant with offloading, otherwise noncompliant with glycemic control and smoking. She has plateaued and there is minimal improvement noted. We will transition to [facility], replaced the insert to her surgical shoe and she will follow-up in one week 05/10/18- She is here in follow up evaluation for [name] left great toe ulcer. It appears stable despite measurement change. We will continue with same treatment plan and follow up next week. 05/24/18-She is seen in follow-up evaluation for [name] left great toe ulcer. She remains compliant with offloading, has made significant improvement in her diet, decreasing the amount of sugar/soda. She said her recent A1c was 10.9 which is lower than. She did see [name] diabetic nutritionist/educator yesterday. She continues to smoke. We will continue with the same treatment plan and she'll follow-up next week. 05/31/18- She is seen in follow-up evaluation for left great toe ulcer. She continues to remain compliant with offloading, continues to make improvement in her diet, increasing her water and decreasing the amount of sugar/soda. She does continue to smoke with no desire to quit. We will apply Prisma to the depth and Hydrofera Blue over. We have not received insurance authorization for oasis. She will follow up next  week. 06/07/18-She is seen in follow-up evaluation for left great toe ulcer. It has stalled according to today's measurements although base appears stable. She says she saw [name] diabetic educator yesterday; her average blood sugars are less than 300 which is an improvement for her. She continues to smoke and states "that's my next step" She continues with water over soda. We will order for xray, culture and reinstate ace wrap compression prior to placing apligraf for next week. She is voicing no complaints or concerns. Her dressing will change to iodoflex over the next week in preparation for apligraf. 06/14/18-She is seen in follow-up evaluation for left great toe ulcer. Plain film x-ray performed last week was negative for osteomyelitis. Wound culture obtained last week grew strep B and OSSA; she is initiated on keflex and cefdinir today; there is erythema to the toe which could be from ace wrap compression, she has [name] history of wrapping too tight and has has been encouraged to maintain ace wraps that we place today. We will hold off on application of apligraf today, will apply next week after antibiotic therapy has been initiated. She admits today that she has resumed taking [name] shower with her foot/toe submerged in water, she has been reminded to keep foot/toe out of the bath water. She will be seen in follow up next week 06/21/18-she is seen in follow-up evaluation for left great toe  ulcer. She is tolerating antibiotic therapy with no GI disturbance. The wound is stable. Apligraf was applied today. She has been decreasing her smoking, only had 4 cigarettes yesterday and 1 today. She continues being more compliant in diabetic diet. She will follow-up next week for evaluation of site, if stable will remove at 2 weeks. 06/28/18- She is here in follow up evalution. Apligraf was placed last week, she states the dressing fell off on Tuesday and she was dressing with hydrofera blue. She is healed and will be  discharged from the clinic today. She has been instructed to continue with smoking cessation, continue monitoring glucose levels, offloading for an additional 4 weeks and continue with hydrofera blue for additional two weeks for any possible microscopic opening. Readmission: 08/07/18 on evaluation today patient presents for reevaluation concerning the ulcer of her right great toe. She was previously discharged on 06/28/18 healed. Nonetheless she states that this began to show signs of drainage she subsequently went to her primary care provider. Subsequently an x-ray was performed on 08/01/18 which was negative. The patient was also placed on antibiotics at that time. Fortunately they should have been effective for the infection. Nonetheless she's been experiencing some improvement but still has [name] lot of drainage coming from the wound itself. 08/14/18 on evaluation today patient's wound actually does show signs of improvement in regard to the erythema at this point. She has completed the antibiotics. With that being said we did discuss the possibility of placing her in [name] total contact cast as of today although I think that I may want to give this just [name] little bit more time to ensure nothing recurrence as far as her infection is concerned. I do not want to put in the cast and risk infection at that time if things are not completely resolved. With that being said she is gonna require some debridement today. 08/21/18 on evaluation today patient actually appears to be doing okay in regard to her toe ulcer. She's been tolerating the dressing changes without complication. With that being said it does appear that she is ready and in fact I think it's appropriate for [place] to go ahead and initiate the total contact cast today. Nonetheless she will require some sharp debridement to prepare the wound for application. Overall I feel like things have been progressing well but we do need to do something to get this to  close more readily. 08/24/18 patient seen today for reevaluation after having had the total contact cast applied on Tuesday. She seems to have done very well the wound appears to be doing great and overall I'm pleased with the progress that she's made. There were no abnormal areas of rubbing from the cast on her lower extremity. 08/30/18 on evaluation today patient actually appears to be completely healed in regard to her plantar toe ulcer. She tells me at this point she's been having [name] lot of issues with the cast. She almost fell [name] couple of times the state shall the step of her dog [name] couple times as well. This is been [name] very frustrating process for her other nonetheless she has completely healed the wound which is excellent news. Overall there does not appear to be the evidence of infection at this time which is great news. 09/11/18 evaluation today patient presents for follow-up concerning her great toe ulcer on the left which has unfortunately reopened since I last saw her which was only [name] couple of weeks ago. Unfortunately she was not able to  get in to get the shoe and potentially the AFO that's gonna be necessary due to her left foot drop. She continues with offloading shoe but this is not enough to prevent her from reopening it appears. When we last had her in the total contact cast she did well from [name] healing standpoint but unfortunately the wound reopened as soon as she came out of the cast within just [name] couple of weeks. 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 This is also part of the reason why she falls on [name] regular basis. I just do not believe that is gonna be safe for [place] to reinitiate the total contact cast as last time we had this on she fell 3 times one week which is definitely not normal  for her. 09/18/18 upon evaluation today the patient actually appears to be doing about the same in regard to her toe ulcer. She did not contact [facility] as I asked her to even though I had given her the prescription. In fact she actually states that she has no idea where the prescription is. She did apparently call [facility] and they told her that all she needed to do was bring the prescription in order to be able to be seen and work on getting the AFO for her left foot. With all that being said she still does not have an appointment and I'm not sure were things stand that regard. I will give her [name] new prescription today in order to contact them to get this set up. CHERI, AYOTTE [name]. ([id number]) 09/25/18 on evaluation today patient actually appears to be doing about the same in regard to her toes ulcer. She does have [name] small areas which seems to have [name] lot of callous buildup around the edge of the wound which is going to need sharp debridement today. She still is waiting to be scheduled for evaluation with [facility] for possibility of an AFO. She states there supposed to call her tomorrow to get this set up. Unfortunately it does appear that her foot specifically the toe area is showing signs of erythema. There does not appear to be any systemic infection which is in these good news. 10/02/18 on evaluation today patient actually appears to be doing about the same in regard to her toe ulcer. This really has not done too well although it's not significantly larger it's also not significantly smaller. She has been tolerating the dressing changes without complication. She actually has her appointment with [facility] and [place] tomorrow to hopefully be measured for obtaining and AFO splint. I think this would be helpful preventing this from reoccurring. We had contemplated starting the cast this week although to be honest I am reluctant to do that as she's been having nausea, vomiting, and seizure activity over  the past three days. She has [name] history of seizures and have been told is nothing that can be done for these. With that being said I do believe that along with the seizures have the nausea vomiting which upon further questioning doesn't seem to be the normal for her and makes me concerned for the possibility of infection or something else going on. I discussed this with the patient and her mother during the office visit today. I do not feel the wound  is effective but maybe something else. The responses this was "this just happens to her at times and we don't know why". They did not seem to be interested in going to the hospital to have this checked out further. 10/09/18 on evaluation today patient presents for follow-up concerning her ongoing toe ulcer. She has been tolerating the dressing changes without complication. Fortunately there does not appear to be any evidence of infection which is great news however I do think that the patient would benefit from going ahead for with the total contact cast. She's actually in [name] wheelchair today she tells me that she will use her walker if we initiate the cast. I was very specific about the fact that if we were gonna do the cast I wanted to make sure that she was using the walker in order to prevent any falls. She tells me she does not have stairs that she has to traverse on [name] regular basis at her home. She has not had any seizures since last week again that something that happens to her often she tells me she did talk to [employer] and they said that it may take up to three weeks to get the brace approved for her. Hopefully that will not take that long but nonetheless in the meantime I do think the cast could be of benefit. 10/12/18 on evaluation today patient appears to be doing rather well in regard to her toe ulcer. It's just been [name] few days and already this is significantly improved both as far as overall appearance and size. Fortunately there's no sign of  infection. She is here for her first obligatory cast change. 10/19/18 Seen today for follow up and management of left great toe ulcer. Wound continues to show improvement. Noted small open area with seroussang drainage with palpation. Denies any increased pain or recent fevers during visit. She will continue calcium alginate with offloading shoe. Denies any questions or concerns during visit. 10/26/18 on evaluation today patient appears to be doing about the same as when I last saw her in regard to her wound bed. Fortunately there does not appear to be any signs of infection. Unfortunately she continues to have [name] breakdown in regard to the toe region any time that she is not in the cast. It takes almost no time at all for this to happen. Nonetheless she still has not heard anything from the brace being made by [employer] as to when exactly this will be available to her. Fortunately there is no signs of infection at this time. 10/30/18 on evaluation today patient presents for application of the total contact cast as we just received him this morning. Fortunately we are gonna be able to apply this to her today which is great news. She continues to have no significant pain which is good news. Overall I do feel like things have been improving while she was the cast is when she doesn't have [name] cast that things get worse. She still has not really heard anything from [place] regarding her brace. 11/02/18 upon evaluation today patient's wound already appears to be doing significantly better which is good news. Fortunately there does not appear to be any signs of infection also good news. Overall I do think the total contact cast as before is helping to heal this area unfortunately it's just not gonna likely keep the area closed and healed without her getting her brace at least. Again the foot drop is [name] significant issue for her. 11/09/18 on evaluation  today patient appears to be doing excellent in regard to her toe  ulcer which in fact is completely healed. Fortunately we finally got the situation squared away with the paperwork which was needed to proceed with getting her brace approved by Medicaid. I have filled that out unfortunately that information has been sent to the orthopedic office that I worked at 2 1/2 years ago and not tired Current wound care measures. Fortunately she seems to be doing very well at this time. 11/23/18 on evaluation today patient appears to be doing More Poorly Compared to Last Time I Saw Her. At [facility] She Had Completely Healed. Currently she is continuing to have issues with reopening. She states that she just found out that the brace was approved through Medicaid now she just has to go get measured in order to have this fitted for her and then made. Subsequently she does not have an appointment for this yet that is going to complicate things we obviously cannot put her back in the cast if we do not have everything measured because they're not gonna be able to measure her foot while she is in the cast. 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 Obviously this is unfortunate and does have me somewhat worried as well. 11/30/18 on evaluation today patient's toe ulcer actually appears to be doing fairly well. The good news is she will be getting her brace in the shoes next week on Wednesday. Hopefully we will be able to get this to heal without having to go back in the cast however she may need the cast in order to get the wound completely heal and then go from there. Fortunately there's no signs of infection at this time. 12/07/18 on evaluation today patient fortunately did receive her brace and she states she could tell this definitely makes her walk better. With that being said she's been having issues with her toe where she noticed yesterday there was [name] lot of tissue that was loosing  off this appears to be much larger than what it was previous. She also states that her leg has been read putting much across the top of her foot just about the ankle although this seems to be receiving somewhat. The total area is still red and appears to be someone infected as best I can tell. She is previously taken Bactrim and that may be [name] good option for her today as well. We are gonna see what I wound culture shows as well and I think that this is definitely appropriate. With that being said outside of the culture I still need to initiate something in the interim and that's what I'm gonna go ahead and select Bactrim is [name] good option for her. 12/14/18 on evaluation today patient appears to be doing better in regard to her left great toe ulcer as compared to last week's evaluation. There's still some erythema although this is significantly improved which is excellent news. Overall I do believe that she is making good progress is still gonna take some time before she is where I would like her to be from the standpoint of being able to place her back into the total contact cast. Hopefully we will be where we need to be by next week. 12/21/18 on evaluation today patient actually appears to be doing poorly in regard to her toe ulcer. She's been tolerating the dressing  changes without complication. Fortunately there's no signs of systemic infection although she does have [name] lot of drainage from the toe ulcer and this does Twardowski, Ting [name]. ([id number]) seem to be causing some issues at this point. She does have erythema on the distal portion of her toe that appears to be likely cellulitis. 12/28/18 on evaluation today patient actually appears to be doing [name] little better in my pinion in regard to her toe ulcer. With that being said she still does have some evidence of infection at this time and for her culture she had both E. coli as well as enterococcus as organisms noted on evaluation. For that reason I  think that though the Keflex likely has treated the E. coli rather well this has really done nothing for the enterococcus. We are going to have to initiate treatment for this specifically. 01/04/19 on evaluation today patient's toe actually appears to be doing better from the standpoint of infection. She currently would like to see about putting the cash back on I think that this is appropriate as long as she takes care of it and keeps it from getting wet. She is gonna have some drainage we can definitely pass this up with Drawtex and alginate to try to prevent as much drainage as possible from causing the problems. With that being said I do want to at least try her with the cast between now and Tuesday. If there any issues we can't continue to use it then I will discontinue the use of the cast at that point. 01/08/19 on evaluation today patient actually appears to be doing very well as far as her foot ulcer specifically the great toe on the left is concerned. She did have an area of rubbing on the medial aspect of her left ankle which again is from the cast. Fortunately there's no signs of infection at this point in this appears to be [name] very slight skin breakdown. The patient tells me she felt it rubbing but didn't think it was that bad. Fortunately there is no signs of active infection at this time which is good news. No fevers, chills, nausea, or vomiting noted at this time. 01/15/19 on evaluation today patient actually appears to be doing well in regard to her toe ulcer. Again as previous she seems to do well and she has the cast on which indicates to me that during the time she doesn't have [name] cast on she's putting way too much pressure on this region. Obviously I think that's gonna be an issue as with the current national emergency concerning the Covid-19 Virus it has been recommended that we discontinue the use of total contact casting by the chief medical officer of our company, Dr. Simona Huh. The  reasoning is that if [name] patient becomes sick and cannot come into have the cast removed they could not just leave this on for an additional two weeks. Obviously the hospitals also do not want to receive patient's who are sick into the emergency department to potentially contaminate the region and spread the Covid-19 Virus among other sick individuals within the hospital system. Therefore at this point we are suspending the use of total contact cast until the current emergency subsides. This was all discussed with the patient today as well. 01/22/19 on evaluation today patient's wound on her left great toe appears to be doing slightly worse than previously noted last week. She tells me that she has been on this quite [name] bit in fact she tells me  she's been awake for 38 straight hours. This is due to the fact that she's having to care for grandparents because nobody else will. She has been taking care of them for five the last seven days since I've seen her they both have dementia his is from [name] stroke and her grandmother's was progressive. Nonetheless she states even her mom who knows her condition and situation has only help two of those days to take care of them she's been taking care of the rest. Fortunately there does not appear to be any signs of active infection in regard to her toe at this point although obviously it doesn't look as good as it did previous. I think this is directly related to her not taking off the pressure and friction by way of taking things easy. Though I completely understand what's going on. 01/29/19 on evaluation today patient's tools are actually appears to be showing some signs of improvement today compared to last week's evaluation as far as not necessarily the overall size of the wound but the fact that she has some new skin growth in between the two ends of the wound opening. Overall I feel like she has done well she states that she had [name] family member give her what sounds to  be [name] CAM walker boot which has been helpful as well. 02/05/19 on evaluation today patient's wound bed actually appears to be doing significantly better in regard to her overall appearance of the size of the wound. With that being said she is still having an issue with offloading efficiently enough to get this to close. Apparently there is some signs of infection at this point as well unfortunately. Previously she's done well of Augmentin I really do not see anything that needs to be culture currently but there theme and cellulitis of the foot that I'm seeing I'm gonna go ahead and place her on an antibiotic today to try to help clear this up. 02/12/2019 on evaluation today patient actually appears to be doing poorly in regard to her overall wound status. She tells me she has been using her offloading shoe but actually comes in today wearing her tennis shoe with the AFO brace. Again as I previously discussed with her this is really not sufficient to allow the area to heal appropriately. Nonetheless she continues to be somewhat noncompliant and I do wonder based on what she has told my nurse in the past as to whether or not [name] good portion of this noncompliance may be recreational drug and alcohol related. She has had [name] history of heroin overdose and this was fairly recently in the past couple of months that have been seeing her. Nonetheless overall I feel like her wound looks significantly worse today compared to what it was previous. She still has significant erythema despite the Augmentin I am not sure that this is an appropriate medication for her infection I am also concerned that the infection may have gone down into her bone. 02/19/19 on evaluation today patient actually appears to be doing about the same in regard to her toe ulcer. Unfortunately she continues to show signs of bone exposure and infection at this point. There does not appear to be any evidence of worsening of the infection but I'm  also not really sure that it's getting significantly better. She is on the Augmentin which should be sufficient for the Staphylococcus aureus infection that she has at this point. With that being said she may need IV antibiotics to more  appropriately treat this. We did have [name] discussion today about hyperbaric option therapy. 02/28/19 on evaluation today patient actually appears to be doing much worse in regard to the wound on her left great toe as compared to even my previous evaluation last week. Unfortunately this seems to be training in [name] pretty poor direction. Her toe was actually now starting to angle laterally and I can actually see the entire joint area of the proximal portion of the digit where is the distal portion of the digit again is no longer even in contact with the joint line. Unfortunately there's [name] lot more necrotic tissue around the edge and the toe appears to be showing signs of becoming gangrenous in my pinion. I'm very concerned about were things stand at this point. She did see infectious disease and they are planning to send in [name] prescription for Sivextro for her and apparently this has been approved. With that being said I don't think she should avoid taking this but at the same time I'm not sure that it's gonna be sufficient to save her toe at this point. She tells me that she still having to care for grandparents which I think is putting quite [name] bit of strain on her foot and specifically the total area and has caused this to break down even to [name] greater degree than would've otherwise been expected. 03/05/19 on evaluation today patient actually appears to be doing quite well in regard to her toe all things considering. She still has bone exposed but there appears to be much less your thing on overall the appearance of the wound and the toe itself is dramatically improved. She still does have some issues currently obviously with infection she did see vascular as well and there  concerned that her blood flow to the toad. For that reason they are setting up for an angiogram next week. 03/14/19 on evaluation today patient appears to be doing very poor in regard to her toe and specifically in regard to the ulceration and the fact that she's starting to notice the toe was leaning even more towards the lateral aspect and the complete joint is visible on the proximal aspect of the joint. Nonetheless she's also noted [name] significant odor and the tip of the toe is turning more dark and necrotic appearing. Overall I think she is getting worse not better as far as this is concerned. For that reason I am recommending at this point that she likely needs to be seen for likely amputation. NYALA, KIRCHNER ([id number]) READMISSION 03/19/2021 This is [name] patient that we cared for in this clinic for [name] prolonged period of time in 2019 and 2020 with [name] left foot and left first toe wound. 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 Luana Shu. In December 2021 she had an ulcer on her right great toe as well as the fourth and fifth toes. 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 She also had an angiogram at that time and underwent angioplasty of the right anterior tibial artery. In any case she claims that the wound on the right foot is closed I did not look at this today which was probably an oversight although I think that should be done next week. 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 According to Dr. Deborra Medina  last review that she was out of the area being cared for by another physician but recently came back to his attention. 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 [name] culture of this area showed E. coli apparently before she came back to see Dr. Luana Shu she was supposed to be  receiving antibiotics but she did not really take them. Nor is she offloading this area at all. Finally her last hemoglobin A1c listed in epic was in March 2022 at 14.1 she says things are [name] lot better since then although I am not sure. 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 She had this wound at the time. During this admission she had E. coli sepsis [name] TEE was negative. 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 She is still [name] heavy cigarette smoker. She has [name] history of bipolar disorder seizure disorder ABI in our clinic was 1.05 6/1; left midfoot in the setting of [name] TMA done previously. Round circular wound with [name] "knuckle" of protruding tissue. The problem is that the knuckle was not attached to any of the surrounding granulation and this probed proximally widely I removed [name] large portion of this tissue. This wound goes with considerable undermining laterally. I do not feel any bone there was no purulence but this is [name] deep wound. 6/8; in spite of the debridement I did last week. She arrives with [name] wound looking exactly the same. [name] protruding "knuckle" of tissue nonadherent to most of the surrounding tissue. There is considerable depth around this from 6-12 o'clock at 2.7 cm and undermining of 1 cm. This does not look overtly infected and the x-ray I did last week was negative for any osseous abnormalities. We have been using silver collagen 6/15; deep tissue culture I did last week showed moderate staph aureus and moderate Pseudomonas. This will definitely require prolonged antibiotic therapy. The pathology on the protuberant area was negative for  malignancy fungus etc. 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 We have been using silver collagen. I am going to be prescribing Levaquin for 2 weeks. Her CT scan of the foot is down for 7/5 6/22; CT scan of the foot on 7 5. She says she has hardware in the left leg from her previous fracture. She is on the Levaquin for the deep tissue culture I did that showed methicillin sensitive staph aureus and Pseudomonas. I gave her [name] 2-week supply and she will have another week. She arrives in clinic today with the same protuberant tissue however this is nonadherent to the tissue surrounding it. I am really at [name] loss to explain this unless there is underlying deep tissue infection 6/29; patient presents for 1 week follow-up. She has been using collagen to the wound bed. She reports taking her antibiotics as prescribed.She has no complaints or issues today. She denies signs of infection. 7/6; patient presents for one week followup. She has been using collagen to the wound bed. She states she is taking Levaquin however at times she is not able to keep it down. She denies signs of infection. 7/13; patient presents for 1 week follow-up. She has been using silver alginate to the wound bed. She  still has nausea when taking her antibiotics. She denies signs of infection. 7/20; patient presents for 1 week follow-up. She has been using silver alginate with gentamicin cream to the wound bed. She denies any issues and has no complaints today. She denies signs of infection. 7/27; patient presents for 1 week follow-up. She continues to use silver alginate with gentamicin cream to the wound bed. She reports starting her antibiotics. She has no issues or complaints. Overall she reports stability to the wound. 8/3; patient presents for 1 week follow-up. She has been using silver alginate with gentamicin cream to the wound bed. She reports completing all  antibiotics. She has no issues or complaints today. She denies signs of infection. 8/17; patient presents for 2-week follow-up. He is to use silver alginate to the wound bed. She has no issues or complaints today. She denies signs of infection. She reports her pain has improved in her foot since last clinic visit 8/24; patient presents for 1 week follow-up. She continues to use silver alginate to the wound bed. She has no issues or complaints. She denies signs of infection. Pain is stable. 9/7; patient presents for follow-up. She missed her last week appointment due to feeling ill. She continues to use silver alginate. She has [name] new wound to the right lower extremity that is covered in eschar. She states It occurred over the past week and has no idea how it started. She currently denies signs of infection. 9/14; patient presents for follow-up. To the left foot wound she has been using gentamicin cream and silver alginate. To the right lower extremity wound she has been keeping this covered and has not obtain Santyl. 9/21; patient presents for follow-up. She reports using gentamicin cream and silver alginate to the left foot and Santyl to the right lower extremity wound. She has no issues or complaints today. She denies signs of infection. 9/28; patient presents for follow-up. She reports [name] new wound to her right heel. She states this occurred [name] few days ago and is progressively gotten worse. She has been trying to clean the area with [name] Q-tip and Santyl. She reports stability in the other 2 wounds. She has been using gentamicin cream and silver alginate to the left foot and Santyl to the right lower extremity wound. 10/12; patient presents for follow-up. She reports improvement to the wound beds. She is seeing vein and vascular to discuss the potential of [name] left BKA. She states they are going to do an arteriogram. She continues to use silver alginate with dressing changes to her wounds. 11/2; patient  presents for follow-up. She states she has not been doing dressing changes to the wound beds. She states she is not able to offload the areas. She reports chronic pain to her left foot wound. 11/9; patient presents for follow-up. She came in with only socks on. She states she forgot to put on shoes. It is unclear if she is doing any dressing changes. She currently denies systemic signs of infection. 11/16; patient presents for follow-up. She came again only with socks on. She states she does not wear shoes ever. It is unclear if she does dressing changes. She currently denies systemic signs of infection. 11/23; patient presents for follow-up. She wore her shoes today. It still unclear exactly what dressing she is using for each wound but she did states she obtained Dakin's solution and has been using this to the left foot wound. She currently denies signs of infection. LYVIA, MONDESIR [name]. ([id number])  11/30; patient presents for follow-up. She has no issues or complaints today. She currently denies signs of infection. 12/7; patient presents for follow-up. She has no issues or complaints today. She has been using Hydrofera Blue to the right heel wound and Dakin solution to the left foot wound. Her right anterior leg wound is healed. She currently denies signs of infection. 12/14; patient presents for follow-up. She has been using Hydrofera Blue to the right heel and Dakin's to the left foot wounds. She has no issues or complaints today. She denies signs of infection. 12/21; patient presents for follow-up. She reports using Hydrofera Blue to the right heel and Dakin's to the left foot wound. She denies signs of infection. 12/28; patient presents for follow-up. She continues to use Dakin's to the left foot wound and Hydrofera Blue to the right heel wound. She denies signs of infection. 1/4; patient presents for follow-up. She has no issues or complaints today. She denies signs of infection. 1/11;  patient presents for follow-up. It is unclear if she has been dressing these wounds over the past week. She currently denies signs of infection. 1/18; patient presents for follow-up. She states she has been using Dakin's wet-to-dry dressings to the left foot. She has been using Hydrofera Blue to the right foot foot wound. She states that the anterior right leg wound has reopened and draining serous fluid. She denies signs of infection. 1/25; patient presents for follow-up. She has no issues or complaints today. Objective Constitutional Vitals Time Taken: 10:19 AM, Height: 69 in, Weight: 185 lbs, BMI: 27.3, Temperature: 97.7 F, Pulse: 102 bpm, Respiratory Rate: 18 breaths/min, Blood Pressure: 161/107 mmHg. General Notes: pt states asymptomatic, pt states takes BP meds at night, MD Norman Piacentini made aware General Notes: Left foot (Transmetatarsal amputation): Wound to the plantar aspect that has granulation tissue with nonviable tissue and increased pockets of depth. There is circumferential callus. Right foot: To the heel there is an open wound with Granulation tissue , And scant nonviable tissue with Circumferential callus. No obvious signs of infection to any wound. Right lower extremity: To the anterior aspect there is an open wound with dried nonviable tissue tightly adhered Integumentary (Hair, Skin) Wound #10 status is Open. Original cause of wound was Gradually Appeared. The date acquired was: 11/04/2021. The wound has been in treatment 1 weeks. The wound is located on the Right,Midline Lower Leg. The wound measures 1.2cm length x 0.8cm width x 0.1cm depth; 0.754cm^2 area and 0.075cm^3 volume. There is Fat Layer (Subcutaneous Tissue) exposed. There is no tunneling or undermining noted. There is [name] medium amount of serosanguineous drainage noted. There is small (1-33%) pink granulation within the wound bed. There is [name] large (67- 100%) amount of necrotic tissue within the wound bed including  Eschar. Wound #7 status is Open. Original cause of wound was Gradually Appeared. The date acquired was: 08/07/2020. The wound has been in treatment 34 weeks. The wound is located on the [place]. The wound measures 4.3cm length x 1.5cm width x 0.3cm depth; 5.066cm^2 area and 1.52cm^3 volume. There is Fat Layer (Subcutaneous Tissue) exposed. There is no tunneling or undermining noted. There is [name] large amount of serosanguineous drainage noted. The wound margin is thickened. There is large (67-100%) pink, hyper - granulation within the wound bed. There is [name] small (1-33%) amount of necrotic tissue within the wound bed including Adherent Slough. Wound #9 status is Open. Original cause of wound was Gradually Appeared. The date acquired was: 07/18/2021. The wound  has been in treatment 17 weeks. The wound is located on the Right Calcaneus. The wound measures 0.4cm length x 0.4cm width x 0.2cm depth; 0.126cm^2 area and 0.025cm^3 volume. There is Fat Layer (Subcutaneous Tissue) exposed. There is no tunneling or undermining noted. There is [name] medium amount of serosanguineous drainage noted. The wound margin is well defined and not attached to the wound base. There is small (1-33%) pink granulation within the wound bed. There is [name] large (67-100%) amount of necrotic tissue within the wound bed including Eschar and Adherent Slough. Assessment Active Problems ICD-10 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ity Zimmermann, Dariah [name]. 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 I debrided nonviable tissue. The right foot wound is stable. I also debrided nonviable tissue here. The anterior right leg wound is unchanged. Patient has not dressing this. I recommended continue with Dakin's to the left foot, Hydrofera Blue to the right heel and Santyl to the right anterior leg wound. There were no signs of surrounding infection. Procedures Wound #7 Pre-procedure diagnosis of Wound #7 is [name] Diabetic Wound/Ulcer of the Lower Extremity located on the [place] .Severity of Tissue Pre Debridement is: Fat layer exposed. There was [name] Excisional Skin/Subcutaneous Tissue Debridement with [name] total area of 6.45 sq cm performed by Kalman Shan, MD. With the following instrument(s): Curette to remove Viable and Non-Viable tissue/material. Material removed includes Subcutaneous Tissue and Slough and after achieving pain control using Lidocaine 4% Topical Solution. No specimens were taken. [name] time out was conducted at 11:17, prior to the start of the procedure. [name] Minimum amount of bleeding was controlled with Pressure. The procedure was tolerated well. Post Debridement Measurements: 4.3cm length x 1.5cm width x 0.3cm depth; 1.52cm^3 volume. Character of Wound/Ulcer Post Debridement is stable. Severity of Tissue Post Debridement is: Fat layer exposed. Post procedure Diagnosis Wound #7: Same as Pre-Procedure Wound #9 Pre-procedure diagnosis of Wound #9 is [name] Diabetic Wound/Ulcer of the Lower Extremity located on the Right Calcaneus .Severity of Tissue Pre Debridement is: Fat layer exposed. There was [name] Excisional Skin/Subcutaneous Tissue Debridement with [name] total area of 0.16 sq cm performed by Kalman Shan, MD. With the following instrument(s): Curette to remove Viable and Non-Viable tissue/material. Material removed includes Subcutaneous Tissue after achieving pain control using Lidocaine 4% Topical Solution. No specimens were taken. [name] time out was  conducted at 11:15, prior to the start of the procedure. [name] Minimum amount of bleeding was controlled with Pressure. The procedure was tolerated well. Post Debridement Measurements: 0.4cm length x 0.4cm width x 0.4cm depth; 0.05cm^3 volume. Character of Wound/Ulcer Post Debridement is stable. Severity of Tissue Post Debridement is: Fat layer exposed. Post procedure Diagnosis Wound #9: Same as Pre-Procedure Plan Follow-up Appointments: Return Appointment in 1 week. Bathing/ Shower/ Hygiene: Wash wounds with antibacterial soap and water. Off-Loading: Open toe surgical shoe with peg assist. Turn and reposition every 2 hours Other: - peg shoe right, off-loader left Medications-Please add to medication list.: P.O. Antibiotics Other: - per MD Heber [place] ok to leave right lower leg dressing on for 3-4 days and  then continue daily WOUND #10: - Lower Leg Wound Laterality: Right, Midline Topical: Santyl Collagenase Ointment, 30 (gm), tube 1 x Per Day/30 Days Discharge Instructions: apply nickel thick to wound bed only Primary Dressing: Zetuvit Plus Silicone Border Dressing 4x4 (in/in) 1 x Per Day/30 Days WOUND #7: - Foot Wound Laterality: Plantar, Left, Midline Cleanser: Dakin 16 (oz) 0.25 1 x Per Day/30 Days Discharge Instructions: Use as directed. Primary Dressing: Gauze 1 x Per Day/30 Days Discharge Instructions: As directed: moistened with Dakins Solution Secondary Dressing: Kerlix 4.5 x 4.1 (in/yd) 1 x Per Day/30 Days Discharge Instructions: Apply Kerlix 4.5 x 4.1 (in/yd) as instructed Secured With: Kerlix Roll Sterile or Non-Sterile 6-ply 4.5x4 (yd/yd) 1 x Per Day/30 Days Discharge Instructions: Apply Kerlix as directed WOUND #9: - Calcaneus Wound Laterality: Right Cleanser: Soap and Water 1 x Per Day/30 Days Discharge Instructions: Gently cleanse wound with antibacterial soap, rinse and pat dry prior to dressing wounds Primary Dressing: Hydrofera Blue Ready Transfer Foam, 2.5x2.5 (in/in)  1 x Per Day/30 Days Discharge Instructions: Apply Hydrofera Blue Ready to wound bed as directed Daloia, Dachelle [name]. ([id number]) Secondary Dressing: Zetuvit Plus Silicone Border Dressing 4x4 (in/in) 1 x Per Day/30 Days Secured With: coverlet 1 x Per Day/30 Days 1. In office sharp debridement 2. Hydrofera Blue, Santyl, Dakin's wet-to-dry dressings 3. Follow-up in 1 week Electronic Signature(s) Signed: 11/17/2021 11:44:49 AM By: Kalman Shan DO Entered By: Kalman Shan on 11/17/2021 11:43:18 Autry, Philis AMarland Kitchen ([id number]) -------------------------------------------------------------------------------- SuperBill Details Patient Name: [name] Date of Service: 11/17/2021 Medical Record Number: [id number] Patient Account Number: [id number] Date of Birth/Sex: [birth year] (46 y.o. F) Treating RN: Levora Dredge Primary Care Provider: Raelene Bott Other Clinician: Referring Provider: Raelene Bott Treating Provider/Extender: Yaakov Guthrie in Treatment: 34 Diagnosis Coding ICD-10 Codes Code Description [phone] 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ity L97.512 Non-pressure chronic ulcer of other part of right foot with fat layer exposed L97.812 Non-pressure chronic ulcer of other part of right lower leg with fat layer exposed E11.621 Type 2 diabetes mellitus with foot ulcer E11.42 Type 2 diabetes mellitus with diabetic polyneuropathy L97.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 of skin Facility Procedures CPT4 Code: [id number] Description: 11042 - DEB SUBQ TISSUE 20 SQ CM/< Modifier: Quantity: 1 CPT4 Code: Description: ICD-10 Diagnosis Description 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 L97.512 Non-pressure chronic ulcer of other part of right foot with fat layer expo Modifier: severity sed Quantity: Physician Procedures CPT4 Code:  [id number] Description: 11042 - WC PHYS SUBQ TISS 20 SQ CM Modifier: Quantity: 1 CPT4 Code: Description: ICD-10 Diagnosis Description 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 L97.512 Non-pressure chronic ulcer of other part of right foot with fat layer expo Modifier: severity sed Quantity: Electronic Signature(s) Signed: 11/17/2021 11:44:49 AM By: Kalman Shan DO Entered By: Kalman Shan on 11/17/2021 11:43:38

## 2021-11-24 ENCOUNTER — Other Ambulatory Visit: Payer: Self-pay

## 2021-11-24 ENCOUNTER — Encounter: Payer: Medicaid Other | Attending: Internal Medicine | Admitting: Internal Medicine

## 2021-11-24 DIAGNOSIS — L97512 Non-pressure chronic ulcer of other part of right foot with fat layer exposed: Secondary | ICD-10-CM | POA: Insufficient documentation

## 2021-11-24 DIAGNOSIS — E11621 Type 2 diabetes mellitus with foot ulcer: Secondary | ICD-10-CM | POA: Diagnosis present

## 2021-11-24 DIAGNOSIS — L97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ity: Secondary | ICD-10-CM | POA: Diagnosis not present

## 2021-11-24 DIAGNOSIS — E11622 Type 2 diabetes mellitus with other skin ulcer: Secondary | ICD-10-CM | POA: Diagnosis not present

## 2021-11-24 DIAGNOSIS — L97812 Non-pressure chronic ulcer of other part of right lower leg with fat layer exposed: Secondary | ICD-10-CM | POA: Insufficient documentation

## 2021-11-24 DIAGNOSIS — L97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 of skin: Secondary | ICD-10-CM | POA: Insufficient documentation

## 2021-11-24 DIAGNOSIS — F17210 Nicotine dependence, cigarettes, uncomplicated: Secondary | ICD-10-CM | POA: Insufficient documentation

## 2021-11-24 DIAGNOSIS — E1142 Type 2 diabetes mellitus with diabetic polyneuropathy: Secondary | ICD-10-CM | POA: Insufficient documentation

## 2021-11-24 NOTE — Progress Notes (Addendum)
[name], [name] ([id number]) Visit Report for 11/24/2021 Arrival Information Details Patient Name: [name], [name] Date of Service: 11/24/2021 11:15 AM Medical Record Number: [id number] Patient Account Number: [id number] Date of Birth/Sex: 11/03/1975 (46 y.o. F) Treating RN: Levora Dredge Primary Care Valor Quaintance: Raelene Bott Other Clinician: Referring Keisy Strickler: Raelene Bott Treating Kayani Rapaport/Extender: Yaakov Guthrie in Treatment: 63 Visit Information History Since Last Visit Added or deleted any medications: Yes Patient Arrived: Wheel Chair Any new allergies or adverse reactions: No Arrival Time: 11:40 Had [name] fall or experienced change in No Accompanied By: mother activities of daily living that may affect Transfer Assistance: EasyPivot Patient Lift risk of falls: Patient Identification Verified: Yes Hospitalized since last visit: No Secondary Verification Process Completed: Yes Has Dressing in Place as Prescribed: Yes Patient Requires Transmission-Based No Has Footwear/Offloading in Place as Prescribed: Yes Precautions: Left: Surgical Shoe with Pressure Relief Patient Has Alerts: Yes Insole Patient Alerts: Patient on Blood Right: Surgical Shoe with Pressure Relief Thinner Insole 53m apirin Pain Present Now: No Diabetic Type II Electronic Signature(s) Signed: 11/24/2021 1:53:04 PM By: GLevora DredgeEntered By: GLevora Dredgeon 11/24/2021 11:42:29 SLonzo Candy([id number] -------------------------------------------------------------------------------- Clinic Level of Care Assessment Details Patient Name: [name] Date of Service: 11/24/2021 11:15 AM Medical Record Number: [id number] Account Number: [id number] of Birth/Sex: [birth year](46y.o. F) Treating RN: GLevora DredgePrimary Care Kaprice Kage: HRaelene BottOther Clinician: Referring Jocilyn Trego: HRaelene BottTreating Kamree Wiens/Extender: HYaakov Guthriein Treatment:  35 Clinic Level of Care Assessment Items TOOL 1 Quantity Score []  - Use when EandM and Procedure is performed on INITIAL visit 0 ASSESSMENTS - Nursing Assessment / Reassessment []  - General Physical Exam (combine w/ comprehensive assessment (listed just below) when performed on new 0 pt. evals) []  - 0 Comprehensive Assessment (HX, ROS, Risk Assessments, Wounds Hx, etc.) ASSESSMENTS - Wound and Skin Assessment / Reassessment []  - Dermatologic / Skin Assessment (not related to wound area) 0 ASSESSMENTS - Ostomy and/or Continence Assessment and Care []  - Incontinence Assessment and Management 0 []  - 0 Ostomy Care Assessment and Management (repouching, etc.) PROCESS - Coordination of Care []  - Simple Patient / Family Education for ongoing care 0 []  - 0 Complex (extensive) Patient / Family Education for ongoing care []  - 0 Staff obtains CProgrammer, systems Records, Test Results / Process Orders []  - 0 Staff telephones HHA, Nursing Homes / Clarify orders / etc []  - 0 Routine Transfer to another Facility (non-emergent condition) []  - 0 Routine Hospital Admission (non-emergent condition) []  - 0 New Admissions / IBiomedical engineer/ Ordering NPWT, Apligraf, etc. []  - 0 Emergency Hospital Admission (emergent condition) PROCESS - Special Needs []  - Pediatric / Minor Patient Management 0 []  - 0 Isolation Patient Management []  - 0 Hearing / Language / Visual special needs []  - 0 Assessment of Community assistance (transportation, D/C planning, etc.) []  - 0 Additional assistance / Altered mentation []  - 0 Support Surface(s) Assessment (bed, cushion, seat, etc.) INTERVENTIONS - Miscellaneous []  - External ear exam 0 []  - 0 Patient Transfer (multiple staff / HCivil Service fast streamer/ Similar devices) []  - 0 Simple Staple / Suture removal (25 or less) []  - 0 Complex Staple / Suture removal (26 or more) []  - 0 Hypo/Hyperglycemic Management (do not check if billed separately) []  - 0 Ankle /  Brachial Index (ABI) - do not check if billed separately Has the patient been seen at the hospital within the last three years: Yes Total Score: 0 Level Of Care:  ____ [name] ([id number]) Electronic Signature(s) Signed: 11/24/2021 1:53:04 PM By: Levora Dredge Entered By: Levora Dredge on 11/24/2021 12:22:33 [name] ([id number]) -------------------------------------------------------------------------------- Encounter Discharge Information Details Patient Name: Heather Dear [name]. Date of Service: 11/24/2021 11:15 AM Medical Record Number: [id number] Patient Account Number: [id number] Date of Birth/Sex: 1976-10-04 (46 y.o. F) Treating RN: Levora Dredge Primary Care Rudell Marlowe: Raelene Bott Other Clinician: Referring Ahmani Daoud: Raelene Bott Treating Thalia Turkington/Extender: Yaakov Guthrie in Treatment: 30 Encounter Discharge Information Items Post Procedure Vitals Discharge Condition: Stable Temperature (F): 98.2 Ambulatory Status: Wheelchair Pulse (bpm): 77 Discharge Destination: Home Respiratory Rate (breaths/min): 18 Transportation: Private Auto Blood Pressure (mmHg): 124/79 Accompanied By: mother Schedule Follow-up Appointment: Yes Clinical Summary of Care: Electronic Signature(s) Signed: 11/24/2021 1:53:04 PM By: Levora Dredge Entered By: Levora Dredge on 11/24/2021 12:24:15 [name] ([id number]) -------------------------------------------------------------------------------- Lower Extremity Assessment Details Patient Name: Heather Dear [name]. Date of Service: 11/24/2021 11:15 AM Medical Record Number: [id number] Patient Account Number: [id number] Date of Birth/Sex: December 26, 1975 (46 y.o. F) Treating RN: Levora Dredge Primary Care Sihaam Chrobak: Raelene Bott Other Clinician: Referring Tilda Samudio: Raelene Bott Treating Wendie Diskin/Extender: Yaakov Guthrie in Treatment: 35 Edema Assessment Assessed: [Left: No] [Right: No] [Left: Edema]  [Right: :] Calf Left: Right: Point of Measurement: 30 cm From Medial Instep 27.9 cm 29.5 cm Ankle Left: Right: Point of Measurement: 10 cm From Medial Instep 18.3 cm 17.6 cm Vascular Assessment Pulses: Dorsalis Pedis Palpable: [Left:Yes] [Right:Yes] Posterior Tibial Palpable: [Left:Yes] [Right:Yes] Electronic Signature(s) Signed: 11/24/2021 1:53:04 PM By: Levora Dredge Entered By: Levora Dredge on 11/24/2021 11:59:41 [name], [name] ([id number]) -------------------------------------------------------------------------------- Multi Wound Chart Details Patient Name: Heather Dear [name]. Date of Service: 11/24/2021 11:15 AM Medical Record Number: [id number] Patient Account Number: [id number] Date of Birth/Sex: 1976/02/21 (46 y.o. 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 Extremity Extremity Comorbid History: Chronic sinus problems/congestion, Chronic sinus problems/congestion, Chronic sinus problems/congestion, Middle ear problems, Anemia, Middle ear problems, Anemia, Middle ear problems, Anemia, Chronic Obstructive Pulmonary Chronic Obstructive Pulmonary Chronic Obstructive Pulmonary Disease (COPD), Congestive Heart Disease (COPD), Congestive Heart Disease (COPD), Congestive Heart Failure, Type II Diabetes, End Stage Failure, Type II Diabetes, End Stage Failure, Type II  Diabetes, End Stage Renal Disease, History of pressure Renal Disease, History of pressure Renal Disease, History of pressure wounds, Neuropathy wounds, Neuropathy wounds, Neuropathy Date Acquired: 11/04/2021 08/07/2020 07/18/2021 Weeks of Treatment: 2 35 18 Wound Status: Open Open Open Wound Recurrence: No No No Measurements L x W x D (cm) 0.9x0.6x0.1 4x1.5x0.3 0.6x0.7x0.2 Area (cm) : 0.424 4.712 0.33 Volume (cm) : 0.042 1.414 0.066 % Reduction in Area: 59.10% -76.50% 0.00% % Reduction in Volume: 59.60% -5.90% 71.40% Classification: Grade 1 Grade 2 Grade 1 Exudate Amount: None Present Large Medium Exudate Type: N/[name] Serosanguineous Serosanguineous Exudate Color: N/[name] red, brown red, brown Wound Margin: N/[name] Thickened Well defined, not attached Granulation Amount: None Present (0%) Large (67-100%) Small (1-33%) Granulation Quality: N/[name] Red, Pink, Hyper-granulation Pink Necrotic Amount: Large (67-100%) Small (1-33%) Large (67-100%) Necrotic Tissue: Eschar Adherent [place], Adherent Slough Exposed Structures: Fat Layer (Subcutaneous Tissue): Fat  Layer (Subcutaneous Tissue): Fat Layer (Subcutaneous Tissue): Yes Yes Yes Fascia: No Fascia: No Tendon: No Tendon: No Muscle: No Muscle: No Joint: No Joint: No Bone: No Bone: No Epithelialization: Small (1-33%) None None Treatment Notes [name], [name] ([id number]) Electronic Signature(s) Signed: 11/24/2021 1:53:04 PM By: Levora Dredge Entered By: Levora Dredge on 11/24/2021 12:07:07 [name] ([id number]) -------------------------------------------------------------------------------- [name] Details Patient Name: Heather Dear [name]. Date of Service: 11/24/2021 11:15 AM Medical Record Number: [id number] Patient Account Number: [id number] Date of Birth/Sex: 23-Aug-1976 (46 y.o. F) Treating RN: Levora Dredge Primary Care Maisy Newport: Raelene Bott Other Clinician: Referring Aniela Caniglia: Raelene Bott Treating Sebert Stollings/Extender: Yaakov Guthrie in Treatment: 32 Active Inactive Necrotic Tissue Nursing Diagnoses: Impaired tissue integrity related to necrotic/devitalized tissue Knowledge deficit related to management of necrotic/devitalized tissue Goals: Necrotic/devitalized tissue will be minimized in the wound bed Date Initiated: 04/07/2021 Target Resolution Date: 10/08/2021 Goal Status: Active Patient/caregiver will verbalize understanding of reason and process for debridement of necrotic tissue Date Initiated: 04/07/2021 Date Inactivated: 09/22/2021 Target Resolution Date: 09/08/2021 Goal Status: Met Interventions: Assess patient pain level pre-, during and post procedure and prior to discharge Provide education on necrotic tissue and debridement process Treatment Activities: Excisional debridement : 04/07/2021 Notes: Electronic Signature(s) Signed: 11/24/2021 1:53:04 PM By: Levora Dredge Entered By: Levora Dredge on 11/24/2021 12:06:12 [name] ([id number]) -------------------------------------------------------------------------------- Pain Assessment Details Patient Name: Heather Dear [name]. Date of Service: 11/24/2021 11:15 AM Medical Record Number: [id number] Patient Account Number: [id number] Date of Birth/Sex: Mar 26, 1976 (46 y.o. F) Treating RN: Levora Dredge Primary Care Jakeob Tullis: Raelene Bott Other Clinician: Referring Aneesah Hernan: Raelene Bott Treating Jamiyah Dingley/Extender: Yaakov Guthrie in Treatment: 62 Active Problems Location of Pain Severity and Description of Pain Patient Has Paino No Site Locations Rate the pain. Current Pain Level: 0 Pain Management and Medication Current Pain Management: Electronic Signature(s) Signed: 11/24/2021 1:53:04 PM By: Levora Dredge Entered By: Levora Dredge on 11/24/2021 11:45:49 [name]  ([id number]) -------------------------------------------------------------------------------- Patient/Caregiver Education Details Patient Name: Heather Dear [name]. Date of Service: 11/24/2021 11:15 AM Medical Record Number: [id number] Patient Account Number: [id number] Date of Birth/Gender: [birth year] (46 y.o. F) Treating RN: Levora Dredge Primary Care Physician: Raelene Bott Other Clinician: Referring Physician: Raelene Bott Treating Physician/Extender: Yaakov Guthrie in Treatment: 54 Education Assessment Education Provided To: Patient and Caregiver Education Topics Provided Wound Debridement: Handouts: Wound Debridement Methods: Explain/Verbal Responses: State content correctly Wound/Skin Impairment: Handouts: Caring for Your Ulcer Methods: Explain/Verbal Responses: State content correctly Electronic Signature(s) Signed: 11/24/2021 1:53:04 PM By: Levora Dredge Entered By: Levora Dredge on 11/24/2021 12:23:17 [name], [name] ([id number]) -------------------------------------------------------------------------------- Wound Assessment Details Patient Name: Heather Dear [name]. Date of Service: 11/24/2021 11:15 AM Medical Record Number: [id number] Patient Account Number: [id number] Date of Birth/Sex: 09-18-1976 (46 y.o. F) Treating RN: Levora Dredge Primary Care Zandria Woldt: Raelene Bott Other Clinician: Referring Tradarius Reinwald: Raelene Bott Treating Myan Locatelli/Extender: Yaakov Guthrie in Treatment: 35 Wound Status Wound Number: 10 Primary Diabetic Wound/Ulcer of the Lower Extremity Etiology: Wound Location: Right, Midline Lower Leg Wound Open Wounding Event: Gradually Appeared Status: Date Acquired: 11/04/2021 Comorbid Chronic sinus problems/congestion, Middle ear problems, Weeks Of Treatment: 2 History: Anemia, Chronic Obstructive Pulmonary Disease (COPD), Clustered Wound: No Congestive Heart Failure, Type II Diabetes, End Stage Renal Disease, History of  pressure wounds, Neuropathy Photos Wound Measurements Length: (cm) 0.9 Width: (cm) 0.6 Depth: (cm) 0.1 Area: (cm) 0.424 Volume: (cm) 0.042 % Reduction in Area: 59.1% % Reduction in Volume: 59.6% Epithelialization: Small (1-33%) Tunneling: No Undermining: No Wound Description  Classification: Grade 1 Exudate Amount: None Present Foul Odor After Cleansing: No Slough/Fibrino Yes Wound Bed Granulation Amount: None Present (0%) Exposed Structure Necrotic Amount: Large (67-100%) Fat Layer (Subcutaneous Tissue) Exposed: Yes Necrotic Quality: Eschar Treatment Notes Wound #10 (Lower Leg) Wound Laterality: Right, Midline Cleanser Peri-Wound Care Topical Santyl Collagenase Ointment, 30 (gm), tube Discharge Instruction: apply nickel thick to wound bed only Primary Dressing Zetuvit Plus Silicone Border Dressing 4x4 (in/in) [name], [name] ([id number]) Secondary Dressing Secured With Compression Wrap Compression Stockings Add-Ons Electronic Signature(s) Signed: 11/24/2021 1:53:04 PM By: Levora Dredge Entered By: Levora Dredge on 11/24/2021 11:53:10 [name], [name] ([id number]) -------------------------------------------------------------------------------- Wound Assessment Details Patient Name: Heather Dear [name]. Date of Service: 11/24/2021 11:15 AM Medical Record Number: [id number] Patient Account Number: [id number] Date of Birth/Sex: 05/20/1976 (46 y.o. F) Treating RN: Levora Dredge Primary Care Chawn Spraggins: Raelene Bott Other Clinician: Referring Tenzin Edelman: Raelene Bott Treating Annika Selke/Extender: Yaakov Guthrie in Treatment: 35 Wound Status Wound Number: 7 Primary Diabetic Wound/Ulcer of the Lower Extremity Etiology: Wound Location: Left, Midline, Plantar Foot Wound Open Wounding Event: Gradually Appeared Status: Date Acquired: 08/07/2020 Comorbid Chronic sinus problems/congestion, Middle ear problems, Weeks Of Treatment: 35 History: Anemia, Chronic  Obstructive Pulmonary Disease (COPD), Clustered Wound: No Congestive Heart Failure, Type II Diabetes, End Stage Renal Disease, History of pressure wounds, Neuropathy Photos Wound Measurements Length: (cm) 4 % Width: (cm) 1.5 % Depth: (cm) 0.3 E Area: (cm) 4.712 Volume: (cm) 1.414 Reduction in Area: -76.5% Reduction in Volume: -5.9% pithelialization: None Tunneling: No Undermining: No Wound Description Classification: Grade 2 Wound Margin: Thickened Exudate Amount: Large Exudate Type: Serosanguineous Exudate Color: red, brown Foul Odor After Cleansing: No Slough/Fibrino Yes Wound Bed Granulation Amount: Large (67-100%) Exposed Structure Granulation Quality: Red, Pink, Hyper-granulation Fascia Exposed: No Necrotic Amount: Small (1-33%) Fat Layer (Subcutaneous Tissue) Exposed: Yes Necrotic Quality: Adherent Slough Tendon Exposed: No Muscle Exposed: No Joint Exposed: No Bone Exposed: No Treatment Notes Wound #7 (Foot) Wound Laterality: Plantar, Left, Midline Cleanser Dakin 16 (oz) 0.25 Discharge Instruction: Use as directed. [name], [name] ([id number]) Peri-Wound Care Topical Primary Dressing Gauze Discharge Instruction: As directed: moistened with Dakins Solution Secondary Dressing Kerlix 4.5 x 4.1 (in/yd) Discharge Instruction: Apply Kerlix 4.5 x 4.1 (in/yd) as instructed Secured With [employer] Sterile or Non-Sterile 6-ply 4.5x4 (yd/yd) Discharge Instruction: Apply Kerlix as directed Compression Wrap Compression Stockings Add-Ons Electronic Signature(s) Signed: 11/24/2021 1:53:04 PM By: Levora Dredge Entered By: Levora Dredge on 11/24/2021 11:56:27 [name], [name] ([id number]) -------------------------------------------------------------------------------- Wound Assessment Details Patient Name: Heather Dear [name]. Date of Service: 11/24/2021 11:15 AM Medical Record Number: [id number] Patient Account Number: [id number] Date of Birth/Sex: Nov 03, 1975 (46  y.o. F) Treating RN: Levora Dredge Primary Care Deidrick Rainey: Raelene Bott Other Clinician: Referring Aronda Burford: Raelene Bott Treating Gaila Engebretsen/Extender: Yaakov Guthrie in Treatment: 35 Wound Status Wound Number: 9 Primary Diabetic Wound/Ulcer of the Lower Extremity Etiology: Wound Location: Right Calcaneus Wound Open Wounding Event: Gradually Appeared Status: Date Acquired: 07/18/2021 Comorbid Chronic sinus problems/congestion, Middle ear problems, Weeks Of Treatment: 18 History: Anemia, Chronic Obstructive Pulmonary Disease (COPD), Clustered Wound: No Congestive Heart Failure, Type II Diabetes, End Stage Renal Disease, History of pressure wounds, Neuropathy Photos Wound Measurements Length: (cm) 0.6 Width: (cm) 0.7 Depth: (cm) 0.2 Area: (cm) 0.33 Volume: (cm) 0.066 % Reduction in Area: 0% % Reduction in Volume: 71.4% Epithelialization: None Tunneling: No Undermining: No Wound Description Classification: Grade 1 Wound Margin: Well defined, not attached Exudate Amount: Medium Exudate Type: Serosanguineous Exudate Color: red, brown Foul Odor After Cleansing:  No Slough/Fibrino Yes Wound Bed Granulation Amount: Small (1-33%) Exposed Structure Granulation Quality: Pink Fascia Exposed: No Necrotic Amount: Large (67-100%) Fat Layer (Subcutaneous Tissue) Exposed: Yes Necrotic Quality: Eschar, Adherent Slough Tendon Exposed: No Muscle Exposed: No Joint Exposed: No Bone Exposed: No Treatment Notes Wound #9 (Calcaneus) Wound Laterality: Right Cleanser Soap and Water Discharge Instruction: Gently cleanse wound with antibacterial soap, rinse and pat dry prior to dressing wounds [name], [name] ([id number]) Peri-Wound Care Topical Primary Dressing Hydrofera Blue Ready Transfer Foam, 2.5x2.5 (in/in) Discharge Instruction: Apply Hydrofera Blue Ready to wound bed as directed Secondary Dressing Zetuvit Plus Silicone Border Dressing 4x4 (in/in) Secured  With coverlet Compression Wrap Compression Stockings Add-Ons Electronic Signature(s) Signed: 11/24/2021 1:53:04 PM By: Levora Dredge Entered By: Levora Dredge on 11/24/2021 11:57:26 [name], [name] ([id number]) -------------------------------------------------------------------------------- Vitals Details Patient Name: Heather Dear [name]. Date of Service: 11/24/2021 11:15 AM Medical Record Number: [id number] Patient Account Number: [id number] Date of Birth/Sex: 1976-03-25 (47 y.o. F) Treating RN: Levora Dredge Primary Care Nicholos Aloisi: Raelene Bott Other Clinician: Referring Blanche Gallien: Raelene Bott Treating Rikita Grabert/Extender: Yaakov Guthrie in Treatment: 35 Vital Signs Time Taken: 11:42 Temperature (F): 98.2 Height (in): 69 Pulse (bpm): 77 Weight (lbs): 185 Respiratory Rate (breaths/min): 18 Body Mass Index (BMI): 27.3 Blood Pressure (mmHg): 124/79 Reference Range: 80 - 120 mg / dl Electronic Signature(s) Signed: 11/24/2021 1:53:04 PM By: Levora Dredge Entered By: Levora Dredge on 11/24/2021 11:45:39

## 2021-11-29 NOTE — Progress Notes (Signed)
[name], [name] ([id number]) Visit Report for 11/24/2021 Chief Complaint Document Details Patient Name: [name], [name] Date of Service: 11/24/2021 11:15 AM Medical Record Number: [id number] Patient Account Number: [id number] Date of Birth/Sex: 1975-11-25 (46 y.o. F) Treating RN: Levora Dredge Primary Care Provider: Raelene Bott Other Clinician: Referring Provider: Raelene Bott Treating Provider/Extender: Yaakov Guthrie in Treatment: 35 Information Obtained from: Patient Chief Complaint 03/19/2021; patient referred by Dr. Luana Shu who has been looking after her left foot for quite a period of time for review of a nonhealing area in the left midfoot Electronic Signature(s) Signed: 11/25/2021 5:04:16 PM By: Kalman Shan DO Entered By: Kalman Shan on 11/25/2021 17:00:00 [name] ([id number]) -------------------------------------------------------------------------------- Debridement Details Patient Name: Heather Dear A. Date of Service: 11/24/2021 11:15 AM Medical Record Number: [id number] Patient Account Number: [id number] Date of Birth/Sex: [birth year] (46 y.o. F) Treating RN: Levora Dredge Primary Care Provider: Raelene Bott Other Clinician: Referring Provider: Raelene Bott Treating Provider/Extender: Yaakov Guthrie in Treatment: 35 Debridement Performed for Wound #9 Right Calcaneus Assessment: Performed By: Physician Kalman Shan, MD Debridement Type: Debridement Severity of Tissue Pre Debridement: Fat layer exposed Level of Consciousness (Pre- Awake and Alert procedure): Pre-procedure Verification/Time Out Yes - 12:06 Taken: Pain Control: Lidocaine 4% Topical Solution Total Area Debrided (L x W): 0.6 (cm) x 0.7 (cm) = 0.42 (cm) Tissue and other material Slough, Subcutaneous, Slough debrided: Level: Skin/Subcutaneous Tissue Debridement Description: Excisional Instrument: Curette Bleeding: Minimum Hemostasis Achieved:  Pressure Response to Treatment: Procedure was tolerated well Level of Consciousness (Post- Awake and Alert procedure): Post Debridement Measurements of Total Wound Length: (cm) 0.6 Width: (cm) 0.7 Depth: (cm) 0.2 Volume: (cm) 0.066 Character of Wound/Ulcer Post Debridement: Stable Severity of Tissue Post Debridement: Fat layer exposed Post Procedure Diagnosis Same as Pre-procedure Electronic Signature(s) Signed: 11/24/2021 1:53:04 PM By: Levora Dredge Signed: 11/25/2021 5:04:16 PM By: Kalman Shan DO Entered By: Levora Dredge on 11/24/2021 12:09:22 [name] ([id number]) -------------------------------------------------------------------------------- HPI Details Patient Name: Heather Dear A. Date of Service: 11/24/2021 11:15 AM Medical Record Number: [id number] Patient Account Number: [id number] Date of Birth/Sex: [birth year] (46 y.o. F) Treating RN: Levora Dredge Primary Care Provider: Raelene Bott Other Clinician: Referring Provider: Raelene Bott Treating Provider/Extender: Yaakov Guthrie in Treatment: 35 History of Present Illness HPI Description: 01/18/18-She is here for initial evaluation of the left great toe ulcer. She is a poor historian in regards to timeframe in detail. She states approximately 4 weeks ago she lacerated her toe on something in the house. She followed up with her primary care who placed her on Bactrim and ultimately a second dose of Bactrim prior to coming to wound clinic. She states she has been treating the toe with peroxide, Betadine and a Band-Aid. She did not check her blood sugar this morning but checked it yesterday morning it was 327; she is unaware of a recent A1c and there are no current records. She saw Dr. she would've orthopedics last week for an old injury to the left ankle, she states he did not see her toe, nor did she bring it to his attention. She smokes approximately 1 pack cigarettes a day. Her social situation is  concerning, she arrives this morning with her mother who appears extremely intoxicated/under the influence; her mother was asked to leave the room and be monitored by the patient's grandmother. The patient's aunt then accompanied the patient and the room throughout the rest of the appointment. We had a lengthy discussion regarding the deleterious effects of uncontrolled hyperglycemia  and smoking as it relates to wound healing and overall health. She was strongly encouraged to decrease her smoking and get her diabetes under better control. She states she is currently on a diet and has cut down her [facility] consumption. The left toe is erythematous, macerated and slightly edematous with malodor present. The edema in her left foot is below her baseline, there is no erythema streaking. We will treat her with Santyl, doxycycline; we have ordered and xray, culture and provided a Peg assist surgical shoe and cultured the wound. 01/25/18-She is here in follow-up evaluation for a left great toe ulcer and presents with an abscess to her suprapubic area. She states her blood sugars remain elevated, feeling "sick" and if levels are below 250, but she is trying. She has made no attempt to decrease her smoking stating that we "can't take away her food in her cigarettes". She has been compliant with offloading using the PEG assist you. She is using Santyl daily. the culture obtained last week grew staph aureus and Enterococcus faecalis; continues on the doxycycline and Augmentin was added on Monday. The suprapubic area has erythema, no femoral variation, purple discoloration, minimal induration, was accessed with a cotton tip applicator with sanguinopurulent drainage, this was cultured, I suspect the current antibiotic treatment will cover and we will not add anything to her current treatment plan. She was advised to go to urgent care or ER with any change in redness, induration or fever. 02/01/18-She is here in  follow-up evaluation for left great toe ulcers and a new abdominal abscess from last week. She was able to use packing until earlier this week, where she "forgot it was there". She states she was feeling ill with GI symptoms last week and was not taking her antibiotic. She states her glucose levels have been predominantly less than 200, with occasional levels between 200-250. She thinks this was contributing to her GI symptoms as they have resolved without intervention. There continues to be significant laceration to left toe, otherwise it clinically looks stable/improved. There is now less superficial opening to the lateral aspect of the great toe that was residual blister. We will transition to [facility] to all wounds, she will continue her Augmentin. If there is no change or deterioration next week for reculture. 02/08/18-She is here in follow-up evaluation for left great toe ulcer and abdominal ulcer. There is an improvement in both wounds. She has been wrapping her left toe with coban, not by our direction, which has created an area of discoloration to the medial aspect; she has been advised to NOT use coban secondary to her neuropathy. She states her glucose levels have been high over this last week ranging from 200-350, she continues to smoke. She admits to being less compliant with her offloading shoe. We will continue with same treatment plan and she will follow-up next week. 02/15/18-She is here in follow-up evaluation for left great toe ulcer and abdominal ulcer. The abdominal ulcer is epithelialized. The left great toe ulcer is improved and all injury from last week using the Coban wrap is resolved, the lateral ulcer is healed. She admits to noncompliance with wearing offloading shoe and admits to glucose levels being greater than 300 most of the week. She continues to smoke and expresses no desire to quit. There is one area medially that probes deeper than it has historically, erythema  to the toe and dorsal foot has consistently waxed and waned. There is no overt signs of cellulitis or infection  but we will culture the wound for any occult infection given the new area of depth and erythema. We will hold off on sensitivities for initiation of antibiotic therapy. 02/22/18-She is here in follow up evaluation for left great toe ulcer. There is overall significant improvement in both wound appearance, erythema and edema with changes made last week. She was not initiated on antibiotic therapy. Culture obtained last week showed oxacillin sensitive staph aureus, sensitive to clindamycin. Clindamycin has been called into the pharmacy but she has been instructed to hold off on initiation secondary to overall clinical improvement and her history of antibiotic intolerance. She has been instructed to contact the clinic with any noted changes/deterioration and the wound, erythema, edema and/or pain. She will follow-up next week. She continues to smoke and her glucose levels remain elevated >250; she admits to compliance with offloading shoe 03/01/18 on evaluation today patient appears to be doing fairly well in regard to her left first toe ulcer. She has been tolerating the dressing changes with the [facility] Dressing without complication and overall this has definitely showed signs of improvement according to records as well is what the patient tells me today. I'm very pleased in that regard. She is having no pain today 03/08/18 She is here for follow up evaluation of a left great toe ulcer. She remains non-compliant with glucose control and smoking cessation; glucose levels consistently >200. She states that she got new shoe inserts/peg assist. She admits to compliance with offloading. Since my last evaluation there is significant improvement. We will switch to prisma at this time and she will follow up next week. She is noted to be tachycardic at this appointment, heart rate 120s; she has a  history of heart rate 70-130 according to our records. She admits to extreme agitation r/t personal issues; she was advised to monitor her heartrate and contact her physician if it does not return to a more normal range (<100). She takes cardizem twice daily. 03/15/18-She is here in follow-up evaluation for left great toe ulcer. She remains noncompliant with glucose control and smoking cessation. She admits to compliance with wearing offloading shoe. The ulcer is improved/stable and we will continue with the same treatment plan and she will follow-up next week 03/22/18-She is here for evaluation for left great toe ulcer. There continues to be significant improvement despite recurrent hyperglycemia (over 500 yesterday) and she continues to smoke. She has been compliant with offloading and we will continue with same treatment plan and she will follow-up next week. 03/29/18-She is here for evaluation for left great toe ulcer. Despite continuing to smoke and uncontrolled diabetes she continues to improve. She is compliant with offloading shoe. We will continue with the same treatment plan and she will follow-up next week 04/05/18- She is here in follow up evaluation for a left great toe ulcer; she presents with small pustule to left fifth toe (resembles ant bite). She admits to compliance with wearing offloading shoe; continues to smoke or have uncontrolled blood glucose control. There is more callus than usual with evidence of bleeding; she denies known trauma. 04/12/18-She is here for evaluation of left great toe ulcer. Despite noncompliance with glycemic control and smoking she continues to make Monje, Ranika A. ([id number]) improvement. She continues to wear offloading shoe. The pustule, that was identified last week, to the left fifth toe is resolved. She will follow-up in 2 weeks 05/03/18-she is seen in follow-up evaluation for a left great toe ulcer. She is compliant with offloading, otherwise  noncompliant with glycemic control and smoking. She has plateaued and there is minimal improvement noted. We will transition to [facility], replaced the insert to her surgical shoe and she will follow-up in one week 05/10/18- She is here in follow up evaluation for a left great toe ulcer. It appears stable despite measurement change. We will continue with same treatment plan and follow up next week. 05/24/18-She is seen in follow-up evaluation for a left great toe ulcer. She remains compliant with offloading, has made significant improvement in her diet, decreasing the amount of sugar/soda. She said her recent A1c was 10.9 which is lower than. She did see a diabetic nutritionist/educator yesterday. She continues to smoke. We will continue with the same treatment plan and she'll follow-up next week. 05/31/18- She is seen in follow-up evaluation for left great toe ulcer. She continues to remain compliant with offloading, continues to make improvement in her diet, increasing her water and decreasing the amount of sugar/soda. She does continue to smoke with no desire to quit. We will apply Prisma to the depth and Hydrofera Blue over. We have not received insurance authorization for oasis. She will follow up next week. 06/07/18-She is seen in follow-up evaluation for left great toe ulcer. It has stalled according to today's measurements although base appears stable. She says she saw a diabetic educator yesterday; her average blood sugars are less than 300 which is an improvement for her. She continues to smoke and states "that's my next step" She continues with water over soda. We will order for xray, culture and reinstate ace wrap compression prior to placing apligraf for next week. She is voicing no complaints or concerns. Her dressing will change to iodoflex over the next week in preparation for apligraf. 06/14/18-She is seen in follow-up evaluation for left great toe ulcer. Plain film x-ray performed  last week was negative for osteomyelitis. Wound culture obtained last week grew strep B and OSSA; she is initiated on keflex and cefdinir today; there is erythema to the toe which could be from ace wrap compression, she has a history of wrapping too tight and has has been encouraged to maintain ace wraps that we place today. We will hold off on application of apligraf today, will apply next week after antibiotic therapy has been initiated. She admits today that she has resumed taking a shower with her foot/toe submerged in water, she has been reminded to keep foot/toe out of the bath water. She will be seen in follow up next week 06/21/18-she is seen in follow-up evaluation for left great toe ulcer. She is tolerating antibiotic therapy with no GI disturbance. The wound is stable. Apligraf was applied today. She has been decreasing her smoking, only had 4 cigarettes yesterday and 1 today. She continues being more compliant in diabetic diet. She will follow-up next week for evaluation of site, if stable will remove at 2 weeks. 06/28/18- She is here in follow up evalution. Apligraf was placed last week, she states the dressing fell off on Tuesday and she was dressing with hydrofera blue. She is healed and will be discharged from the clinic today. She has been instructed to continue with smoking cessation, continue monitoring glucose levels, offloading for an additional 4 weeks and continue with hydrofera blue for additional two weeks for any possible microscopic opening. Readmission: 08/07/18 on evaluation today patient presents for reevaluation concerning the ulcer of her right great toe. She was previously discharged on 06/28/18 healed. Nonetheless she states that this began to show  signs of drainage she subsequently went to her primary care provider. Subsequently an x-ray was performed on 08/01/18 which was negative. The patient was also placed on antibiotics at that time. Fortunately they should have  been effective for the infection. Nonetheless she's been experiencing some improvement but still has a lot of drainage coming from the wound itself. 08/14/18 on evaluation today patient's wound actually does show signs of improvement in regard to the erythema at this point. She has completed the antibiotics. With that being said we did discuss the possibility of placing her in a total contact cast as of today although I think that I may want to give this just a little bit more time to ensure nothing recurrence as far as her infection is concerned. I do not want to put in the cast and risk infection at that time if things are not completely resolved. With that being said she is gonna require some debridement today. 08/21/18 on evaluation today patient actually appears to be doing okay in regard to her toe ulcer. She's been tolerating the dressing changes without complication. With that being said it does appear that she is ready and in fact I think it's appropriate for [place] to go ahead and initiate the total contact cast today. Nonetheless she will require some sharp debridement to prepare the wound for application. Overall I feel like things have been progressing well but we do need to do something to get this to close more readily. 08/24/18 patient seen today for reevaluation after having had the total contact cast applied on Tuesday. She seems to have done very well the wound appears to be doing great and overall I'm pleased with the progress that she's made. There were no abnormal areas of rubbing from the cast on her lower extremity. 08/30/18 on evaluation today patient actually appears to be completely healed in regard to her plantar toe ulcer. She tells me at this point she's been having a lot of issues with the cast. She almost fell a couple of times the state shall the step of her dog a couple times as well. This is been a very frustrating process for her other nonetheless she has completely  healed the wound which is excellent news. Overall there does not appear to be the evidence of infection at this time which is great news. 09/11/18 evaluation today patient presents for follow-up concerning her great toe ulcer on the left which has unfortunately reopened since I last saw her which was only a couple of weeks ago. Unfortunately she was not able to get in to get the shoe and potentially the AFO that's gonna be necessary due to her left foot drop. She continues with offloading shoe but this is not enough to prevent her from reopening it appears. When we last had her in the total contact cast she did well from a healing standpoint but unfortunately the wound reopened as soon as she came out of the cast within just a couple of weeks. 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 This is also part of the reason why she falls on a regular basis. I just do not believe that is gonna be safe for [place] to reinitiate the total contact cast as last time we had this on she  fell 3 times one week which is definitely not normal for her. 09/18/18 upon evaluation today the patient actually appears to be doing about the same in regard to her toe ulcer. She did not contact [employer] as I asked her to even though I had given her the prescription. In fact she actually states that she has no idea where the prescription is. She did apparently call [employer] and they told her that all she needed to do was bring the prescription in order to be able to be seen and work on getting the AFO for her left foot. With all that being said she still does not have an appointment and I'm not sure were things stand that regard. I will give her a new prescription today in order to contact them to get this set up. 09/25/18 on evaluation today  patient actually appears to be doing about the same in regard to her toes ulcer. She does have a small areas which seems to have a lot of callous buildup around the edge of the wound which is going to need sharp debridement today. She still is waiting to be scheduled for evaluation with [employer] for possibility of an AFO. She states there supposed to call her tomorrow to get this set up. Unfortunately it does appear that her foot specifically the toe area is showing signs of erythema. There does not appear to be any systemic infection which is in these good news. [name], [name] ([id number]) 10/02/18 on evaluation today patient actually appears to be doing about the same in regard to her toe ulcer. This really has not done too well although it's not significantly larger it's also not significantly smaller. She has been tolerating the dressing changes without complication. She actually has her appointment with [facility] and [place] tomorrow to hopefully be measured for obtaining and AFO splint. I think this would be helpful preventing this from reoccurring. We had contemplated starting the cast this week although to be honest I am reluctant to do that as she's been having nausea, vomiting, and seizure activity over the past three days. She has a history of seizures and have been told is nothing that can be done for these. With that being said I do believe that along with the seizures have the nausea vomiting which upon further questioning doesn't seem to be the normal for her and makes me concerned for the possibility of infection or something else going on. I discussed this with the patient and her mother during the office visit today. I do not feel the wound is effective but maybe something else. The responses this was "this just happens to her at times and we don't know why". They did not seem to be interested in going to the hospital to have this checked out further. 10/09/18 on evaluation today  patient presents for follow-up concerning her ongoing toe ulcer. She has been tolerating the dressing changes without complication. Fortunately there does not appear to be any evidence of infection which is great news however I do think that the patient would benefit from going ahead for with the total contact cast. She's actually in a wheelchair today she tells me that she will use her walker if we initiate the cast. I was very specific about the fact that if we were gonna do the cast I wanted to make sure that she was using the walker in order to prevent any falls. She tells me she does not have stairs that she has to traverse  on a regular basis at her home. She has not had any seizures since last week again that something that happens to her often she tells me she did talk to [employer] and they said that it may take up to three weeks to get the brace approved for her. Hopefully that will not take that long but nonetheless in the meantime I do think the cast could be of benefit. 10/12/18 on evaluation today patient appears to be doing rather well in regard to her toe ulcer. It's just been a few days and already this is significantly improved both as far as overall appearance and size. Fortunately there's no sign of infection. She is here for her first obligatory cast change. 10/19/18 Seen today for follow up and management of left great toe ulcer. Wound continues to show improvement. Noted small open area with seroussang drainage with palpation. Denies any increased pain or recent fevers during visit. She will continue calcium alginate with offloading shoe. Denies any questions or concerns during visit. 10/26/18 on evaluation today patient appears to be doing about the same as when I last saw her in regard to her wound bed. Fortunately there does not appear to be any signs of infection. Unfortunately she continues to have a breakdown in regard to the toe region any time that she is not in the cast. It  takes almost no time at all for this to happen. Nonetheless she still has not heard anything from the brace being made by [employer] as to when exactly this will be available to her. Fortunately there is no signs of infection at this time. 10/30/18 on evaluation today patient presents for application of the total contact cast as we just received him this morning. Fortunately we are gonna be able to apply this to her today which is great news. She continues to have no significant pain which is good news. Overall I do feel like things have been improving while she was the cast is when she doesn't have a cast that things get worse. She still has not really heard anything from [place] regarding her brace. 11/02/18 upon evaluation today patient's wound already appears to be doing significantly better which is good news. Fortunately there does not appear to be any signs of infection also good news. Overall I do think the total contact cast as before is helping to heal this area unfortunately it's just not gonna likely keep the area closed and healed without her getting her brace at least. Again the foot drop is a significant issue for her. 11/09/18 on evaluation today patient appears to be doing excellent in regard to her toe ulcer which in fact is completely healed. Fortunately we finally got the situation squared away with the paperwork which was needed to proceed with getting her brace approved by Medicaid. I have filled that out unfortunately that information has been sent to the orthopedic office that I worked at 2 1/2 years ago and not tired Current wound care measures. Fortunately she seems to be doing very well at this time. 11/23/18 on evaluation today patient appears to be doing More Poorly Compared to Last Time I Saw Her. At [facility] She Had Completely Healed. Currently she is continuing to have issues with reopening. She states that she just found out that the brace was approved through Medicaid now  she just has to go get measured in order to have this fitted for her and then made. Subsequently she does not have an appointment for this  yet that is going to complicate things we obviously cannot put her back in the cast if we do not have everything measured because they're not gonna be able to measure her foot while she is in the cast. 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 Obviously this is unfortunate and does have me somewhat worried as well. 11/30/18 on evaluation today patient's toe ulcer actually appears to be doing fairly well. The good news is she will be getting her brace in the shoes next week on Wednesday. Hopefully we will be able to get this to heal without having to go back in the cast however she may need the cast in order to get the wound completely heal and then go from there. Fortunately there's no signs of infection at this time. 12/07/18 on evaluation today patient fortunately did receive her brace and she states she could tell this definitely makes her walk better. With that being said she's been having issues with her toe where she noticed yesterday there was a lot of tissue that was loosing off this appears to be much larger than what it was previous. She also states that her leg has been read putting much across the top of her foot just about the ankle although this seems to be receiving somewhat. The total area is still red and appears to be someone infected as best I can tell. She is previously taken Bactrim and that may be a good option for her today as well. We are gonna see what I wound culture shows as well and I think that this is definitely appropriate. With that being said outside of the culture I still need to initiate something in the interim and that's what I'm gonna go ahead and select Bactrim is a good option for her. 12/14/18 on evaluation today patient appears  to be doing better in regard to her left great toe ulcer as compared to last week's evaluation. There's still some erythema although this is significantly improved which is excellent news. Overall I do believe that she is making good progress is still gonna take some time before she is where I would like her to be from the standpoint of being able to place her back into the total contact cast. Hopefully we will be where we need to be by next week. 12/21/18 on evaluation today patient actually appears to be doing poorly in regard to her toe ulcer. She's been tolerating the dressing changes without complication. Fortunately there's no signs of systemic infection although she does have a lot of drainage from the toe ulcer and this does seem to be causing some issues at this point. She does have erythema on the distal portion of her toe that appears to be likely cellulitis. 12/28/18 on evaluation today patient actually appears to be doing a little better in my pinion in regard to her toe ulcer. With that being said she still does have some evidence of infection at this time and for her culture she had both E. coli as well as enterococcus as organisms noted on evaluation. For that reason I think that though the Keflex likely has treated the E. coli rather well this has really done nothing for the enterococcus. We are going to have to initiate treatment for this specifically. [name], [name] ([id number]) 01/04/19 on evaluation today patient's toe actually appears to be doing better from the  standpoint of infection. She currently would like to see about putting the cash back on I think that this is appropriate as long as she takes care of it and keeps it from getting wet. She is gonna have some drainage we can definitely pass this up with Drawtex and alginate to try to prevent as much drainage as possible from causing the problems. With that being said I do want to at least try her with the cast between now and  Tuesday. If there any issues we can't continue to use it then I will discontinue the use of the cast at that point. 01/08/19 on evaluation today patient actually appears to be doing very well as far as her foot ulcer specifically the great toe on the left is concerned. She did have an area of rubbing on the medial aspect of her left ankle which again is from the cast. Fortunately there's no signs of infection at this point in this appears to be a very slight skin breakdown. The patient tells me she felt it rubbing but didn't think it was that bad. Fortunately there is no signs of active infection at this time which is good news. No fevers, chills, nausea, or vomiting noted at this time. 01/15/19 on evaluation today patient actually appears to be doing well in regard to her toe ulcer. Again as previous she seems to do well and she has the cast on which indicates to me that during the time she doesn't have a cast on she's putting way too much pressure on this region. Obviously I think that's gonna be an issue as with the current national emergency concerning the Covid-19 Virus it has been recommended that we discontinue the use of total contact casting by the chief medical officer of our company, Dr. Simona Huh. The reasoning is that if a patient becomes sick and cannot come into have the cast removed they could not just leave this on for an additional two weeks. Obviously the hospitals also do not want to receive patient's who are sick into the emergency department to potentially contaminate the region and spread the Covid-19 Virus among other sick individuals within the hospital system. Therefore at this point we are suspending the use of total contact cast until the current emergency subsides. This was all discussed with the patient today as well. 01/22/19 on evaluation today patient's wound on her left great toe appears to be doing slightly worse than previously noted last week. She tells me that she has  been on this quite a bit in fact she tells me she's been awake for 38 straight hours. This is due to the fact that she's having to care for grandparents because nobody else will. She has been taking care of them for five the last seven days since I've seen her they both have dementia his is from a stroke and her grandmother's was progressive. Nonetheless she states even her mom who knows her condition and situation has only help two of those days to take care of them she's been taking care of the rest. Fortunately there does not appear to be any signs of active infection in regard to her toe at this point although obviously it doesn't look as good as it did previous. I think this is directly related to her not taking off the pressure and friction by way of taking things easy. Though I completely understand what's going on. 01/29/19 on evaluation today patient's tools are actually appears to be showing some signs of  improvement today compared to last week's evaluation as far as not necessarily the overall size of the wound but the fact that she has some new skin growth in between the two ends of the wound opening. Overall I feel like she has done well she states that she had a family member give her what sounds to be a CAM walker boot which has been helpful as well. 02/05/19 on evaluation today patient's wound bed actually appears to be doing significantly better in regard to her overall appearance of the size of the wound. With that being said she is still having an issue with offloading efficiently enough to get this to close. Apparently there is some signs of infection at this point as well unfortunately. Previously she's done well of Augmentin I really do not see anything that needs to be culture currently but there theme and cellulitis of the foot that I'm seeing I'm gonna go ahead and place her on an antibiotic today to try to help clear this up. 02/12/2019 on evaluation today patient actually  appears to be doing poorly in regard to her overall wound status. She tells me she has been using her offloading shoe but actually comes in today wearing her tennis shoe with the AFO brace. Again as I previously discussed with her this is really not sufficient to allow the area to heal appropriately. Nonetheless she continues to be somewhat noncompliant and I do wonder based on what she has told my nurse in the past as to whether or not a good portion of this noncompliance may be recreational drug and alcohol related. She has had a history of heroin overdose and this was fairly recently in the past couple of months that have been seeing her. Nonetheless overall I feel like her wound looks significantly worse today compared to what it was previous. She still has significant erythema despite the Augmentin I am not sure that this is an appropriate medication for her infection I am also concerned that the infection may have gone down into her bone. 02/19/19 on evaluation today patient actually appears to be doing about the same in regard to her toe ulcer. Unfortunately she continues to show signs of bone exposure and infection at this point. There does not appear to be any evidence of worsening of the infection but I'm also not really sure that it's getting significantly better. She is on the Augmentin which should be sufficient for the Staphylococcus aureus infection that she has at this point. With that being said she may need IV antibiotics to more appropriately treat this. We did have a discussion today about hyperbaric option therapy. 02/28/19 on evaluation today patient actually appears to be doing much worse in regard to the wound on her left great toe as compared to even my previous evaluation last week. Unfortunately this seems to be training in a pretty poor direction. Her toe was actually now starting to angle laterally and I can actually see the entire joint area of the proximal portion of the  digit where is the distal portion of the digit again is no longer even in contact with the joint line. Unfortunately there's a lot more necrotic tissue around the edge and the toe appears to be showing signs of becoming gangrenous in my pinion. I'm very concerned about were things stand at this point. She did see infectious disease and they are planning to send in a prescription for Sivextro for her and apparently this has been approved. With that being  said I don't think she should avoid taking this but at the same time I'm not sure that it's gonna be sufficient to save her toe at this point. 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 03/05/19 on evaluation today patient actually appears to be doing quite well in regard to her toe all things considering. She still has bone exposed but there appears to be much less your thing on overall the appearance of the wound and the toe itself is dramatically improved. She still does have some issues currently obviously with infection she did see vascular as well and there concerned that her blood flow to the toad. For that reason they are setting up for an angiogram next week. 03/14/19 on evaluation today patient appears to be doing very poor in regard to her toe and specifically in regard to the ulceration and the fact that she's starting to notice the toe was leaning even more towards the lateral aspect and the complete joint is visible on the proximal aspect of the joint. Nonetheless she's also noted a significant odor and the tip of the toe is turning more dark and necrotic appearing. Overall I think she is getting worse not better as far as this is concerned. For that reason I am recommending at this point that she likely needs to be seen for  likely amputation. READMISSION 03/19/2021 This is a patient that we cared for in this clinic for a prolonged period of time in 2019 and 2020 with a left foot and left first toe wound. 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 ([id number]) 04/09/20 by Dr. Luana Shu. In December 2021 she had an ulcer on her right great toe as well as the fourth and fifth toes. 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 She also had an angiogram at that time and underwent angioplasty of the right anterior tibial artery. In any case she claims that the wound on the right foot is closed I did not look at this today which was probably an oversight although I think that should be done next week. After her surgery she developed a dehiscence but I do not see any follow-up of this. According to Dr. Deborra Medina last review that she was out of the area being cared for by another physician but recently came back to his attention. 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 A culture of this area showed E. coli apparently before she came back to see Dr. Luana Shu she was supposed to be receiving antibiotics but she did not really take them. Nor is she offloading this area at all. Finally her last hemoglobin A1c listed in epic was in March 2022 at 14.1 she says things are a lot better since then although I am not sure. 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 She had this wound at the time. During this admission she had E. coli sepsis a TEE was negative. Past medical history is extensive and includes type 2 diabetes with peripheral neuropathy cardiomyopathy with an ejection fraction of 33%, hypertension, hyperlipidemia chronic renal failure  stage III history of substance abuse with cocaine  although she claims to be clean now verified by her mother. She is still a heavy cigarette smoker. She has a history of bipolar disorder seizure disorder ABI in our clinic was 1.05 6/1; left midfoot in the setting of a TMA done previously. Round circular wound with a "knuckle" of protruding tissue. The problem is that the knuckle was not attached to any of the surrounding granulation and this probed proximally widely I removed a large portion of this tissue. This wound goes with considerable undermining laterally. I do not feel any bone there was no purulence but this is a deep wound. 6/8; in spite of the debridement I did last week. She arrives with a wound looking exactly the same. A protruding "knuckle" of tissue nonadherent to most of the surrounding tissue. There is considerable depth around this from 6-12 o'clock at 2.7 cm and undermining of 1 cm. This does not look overtly infected and the x-ray I did last week was negative for any osseous abnormalities. We have been using silver collagen 6/15; deep tissue culture I did last week showed moderate staph aureus and moderate Pseudomonas. This will definitely require prolonged antibiotic therapy. The pathology on the protuberant area was negative for malignancy fungus etc. 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 We have been using silver collagen. I am going to be prescribing Levaquin for 2 weeks. Her CT scan of the foot is down for 7/5 6/22; CT scan of the foot on 7 5. She says she has hardware in the left leg from her previous fracture. She is on the Levaquin for the deep tissue culture I did that showed methicillin sensitive staph aureus and Pseudomonas. I gave her a 2-week supply and she will have another week. She arrives in clinic today with the same protuberant tissue however this is nonadherent to the tissue surrounding it. I am really at a loss to explain this unless there is  underlying deep tissue infection 6/29; patient presents for 1 week follow-up. She has been using collagen to the wound bed. She reports taking her antibiotics as prescribed.She has no complaints or issues today. She denies signs of infection. 7/6; patient presents for one week followup. She has been using collagen to the wound bed. She states she is taking Levaquin however at times she is not able to keep it down. She denies signs of infection. 7/13; patient presents for 1 week follow-up. She has been using silver alginate to the wound bed. She still has nausea when taking her antibiotics. She denies signs of infection. 7/20; patient presents for 1 week follow-up. She has been using silver alginate with gentamicin cream to the wound bed. She denies any issues and has no complaints today. She denies signs of infection. 7/27; patient presents for 1 week follow-up. She continues to use silver alginate with gentamicin cream to the wound bed. She reports starting her antibiotics. She has no issues or complaints. Overall she reports stability to the wound. 8/3; patient presents for 1 week follow-up. She has been using silver alginate with gentamicin cream to the wound bed. She reports completing all antibiotics. She has no issues or complaints today. She denies signs of infection. 8/17; patient presents for 2-week follow-up. He is to use silver alginate to the wound bed. She has no issues or complaints today. She denies signs of infection. She reports her pain has improved in her foot since last  clinic visit 8/24; patient presents for 1 week follow-up. She continues to use silver alginate to the wound bed. She has no issues or complaints. She denies signs of infection. Pain is stable. 9/7; patient presents for follow-up. She missed her last week appointment due to feeling ill. She continues to use silver alginate. She has a new wound to the right lower extremity that is covered in eschar. She states It  occurred over the past week and has no idea how it started. She currently denies signs of infection. 9/14; patient presents for follow-up. To the left foot wound she has been using gentamicin cream and silver alginate. To the right lower extremity wound she has been keeping this covered and has not obtain Santyl. 9/21; patient presents for follow-up. She reports using gentamicin cream and silver alginate to the left foot and Santyl to the right lower extremity wound. She has no issues or complaints today. She denies signs of infection. 9/28; patient presents for follow-up. She reports a new wound to her right heel. She states this occurred a few days ago and is progressively gotten worse. She has been trying to clean the area with a Q-tip and Santyl. She reports stability in the other 2 wounds. She has been using gentamicin cream and silver alginate to the left foot and Santyl to the right lower extremity wound. 10/12; patient presents for follow-up. She reports improvement to the wound beds. She is seeing vein and vascular to discuss the potential of a left BKA. She states they are going to do an arteriogram. She continues to use silver alginate with dressing changes to her wounds. 11/2; patient presents for follow-up. She states she has not been doing dressing changes to the wound beds. She states she is not able to offload the areas. She reports chronic pain to her left foot wound. 11/9; patient presents for follow-up. She came in with only socks on. She states she forgot to put on shoes. It is unclear if she is doing any dressing changes. She currently denies systemic signs of infection. 11/16; patient presents for follow-up. She came again only with socks on. She states she does not wear shoes ever. It is unclear if she does dressing changes. She currently denies systemic signs of infection. 11/23; patient presents for follow-up. She wore her shoes today. It still unclear exactly what dressing  she is using for each wound but she did states she obtained Dakin's solution and has been using this to the left foot wound. She currently denies signs of infection. 11/30; patient presents for follow-up. She has no issues or complaints today. She currently denies signs of infection. 12/7; patient presents for follow-up. She has no issues or complaints today. She has been using Hydrofera Blue to the right heel wound and Dakin solution to the left foot wound. Her right anterior leg wound is healed. She currently denies signs of infection. MEEKAH, MATH A. ([id number]) 12/14; patient presents for follow-up. She has been using Hydrofera Blue to the right heel and Dakin's to the left foot wounds. She has no issues or complaints today. She denies signs of infection. 12/21; patient presents for follow-up. She reports using Hydrofera Blue to the right heel and Dakin's to the left foot wound. She denies signs of infection. 12/28; patient presents for follow-up. She continues to use Dakin's to the left foot wound and Hydrofera Blue to the right heel wound. She denies signs of infection. 1/4; patient presents for follow-up. She has no issues or  complaints today. She denies signs of infection. 1/11; patient presents for follow-up. It is unclear if she has been dressing these wounds over the past week. She currently denies signs of infection. 1/18; patient presents for follow-up. She states she has been using Dakin's wet-to-dry dressings to the left foot. She has been using Hydrofera Blue to the right foot foot wound. She states that the anterior right leg wound has reopened and draining serous fluid. She denies signs of infection. 1/25; patient presents for follow-up. She has no issues or complaints today. 2/1; patient presents for follow-up. She has no issues or complaints today. She denies signs of infection. Electronic Signature(s) Signed: 11/25/2021 5:04:16 PM By: Kalman Shan DO Entered By:  Kalman Shan on 11/25/2021 17:00:33 [name] ([id number]) -------------------------------------------------------------------------------- Physical Exam Details Patient Name: Heather Dear A. Date of Service: 11/24/2021 11:15 AM Medical Record Number: [id number] Patient Account Number: [id number] Date of Birth/Sex: December 02, 1975 (46 y.o. F) Treating RN: Levora Dredge Primary Care Provider: Raelene Bott Other Clinician: Referring Provider: Raelene Bott Treating Provider/Extender: Yaakov Guthrie in Treatment: 63 Constitutional . Cardiovascular . Psychiatric . Notes Left foot (Transmetatarsal amputation): Wound to the plantar aspect that has granulation tissue and scant nonviable tissue and increased pockets of depth. There is circumferential callus. Right foot: To the heel there is an open wound with Granulation tissue , And scant nonviable tissue with Circumferential callus. No obvious signs of infection to any wound. Right lower extremity: To the anterior aspect there is an open wound with dried nonviable tissue tightly adhered Electronic Signature(s) Signed: 11/25/2021 5:04:16 PM By: Kalman Shan DO Entered By: Kalman Shan on 11/25/2021 17:01:18 [name] ([id number]) -------------------------------------------------------------------------------- Physician Orders Details Patient Name: Heather Dear A. Date of Service: 11/24/2021 11:15 AM Medical Record Number: [id number] Patient Account Number: [id number] Date of Birth/Sex: 02-26-1976 (46 y.o. F) Treating RN: Levora Dredge Primary Care Provider: Raelene Bott Other Clinician: Referring Provider: Raelene Bott Treating Provider/Extender: Yaakov Guthrie in Treatment: 43 Verbal / Phone Orders: No Diagnosis Coding Follow-up Appointments o Return Appointment in 1 week. Bathing/ Shower/ Hygiene o Wash wounds with antibacterial soap and water. Off-Loading Left Lower  Extremity o Open toe surgical shoe with peg assist. o Turn and reposition every 2 hours o Other: - peg shoe right, off-loader left Medications-Please add to medication list. o P.O. Antibiotics o Other: - per MD [facility] ok to leave right lower leg dressing on for 3-4 days and then continue daily Wound Treatment Wound #10 - Lower Leg Wound Laterality: Right, Midline Topical: Santyl Collagenase Ointment, 30 (gm), tube 1 x Per Day/30 Days Discharge Instructions: apply nickel thick to wound bed only Primary Dressing: Zetuvit Plus Silicone Border Dressing 4x4 (in/in) 1 x Per Day/30 Days Wound #7 - Foot Wound Laterality: Plantar, Left, Midline Cleanser: Dakin 16 (oz) 0.25 1 x Per Day/30 Days Discharge Instructions: Use as directed. Primary Dressing: Gauze 1 x Per Day/30 Days Discharge Instructions: As directed: moistened with Dakins Solution Secondary Dressing: Kerlix 4.5 x 4.1 (in/yd) 1 x Per Day/30 Days Discharge Instructions: Apply Kerlix 4.5 x 4.1 (in/yd) as instructed Secured With: Kerlix Roll Sterile or Non-Sterile 6-ply 4.5x4 (yd/yd) 1 x Per Day/30 Days Discharge Instructions: Apply Kerlix as directed Wound #9 - Calcaneus Wound Laterality: Right Cleanser: Soap and Water 1 x Per Day/30 Days Discharge Instructions: Gently cleanse wound with antibacterial soap, rinse and pat dry prior to dressing wounds Primary Dressing: Hydrofera Blue Ready Transfer Foam, 2.5x2.5 (in/in) 1 x Per Day/30 Days Discharge Instructions: Apply  Hydrofera Blue Ready to wound bed as directed Secondary Dressing: Zetuvit Plus Silicone Border Dressing 4x4 (in/in) 1 x Per Day/30 Days Secured With: coverlet 1 x Per Day/30 Days Electronic Signature(s) Signed: 11/25/2021 5:04:16 PM By: Kalman Shan DO Previous Signature: 11/24/2021 1:53:04 PM Version By: Levora Dredge Entered By: Kalman Shan on 11/25/2021 17:03:05 [name] ([id number]) [name], [name]  ([id number]) -------------------------------------------------------------------------------- Problem List Details Patient Name: Heather Dear A. Date of Service: 11/24/2021 11:15 AM Medical Record Number: [id number] Patient Account Number: [id number] Date of Birth/Sex: 1976/02/14 (46 y.o. 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 03/19/2021 No Yes severity L97.512 Non-pressure chronic ulcer of other part of right foot with fat layer 09/15/2021 No Yes exposed L97.812 Non-pressure chronic ulcer of other part of right lower leg with fat layer 09/15/2021 No Yes exposed E11.621 Type 2 diabetes mellitus with foot ulcer 03/19/2021 No Yes E11.42 Type 2 diabetes mellitus with diabetic polyneuropathy 03/19/2021 No Yes L97.811 Non-pressure chronic ulcer of other part of right lower leg limited to 11/10/2021 No Yes breakdown of skin Inactive Problems ICD-10 Code Description Active Date Inactive Date S81.801A Unspecified open wound, right lower leg, initial encounter 06/30/2021 06/30/2021 Resolved Problems Electronic Signature(s) Signed: 11/25/2021 5:04:16 PM By: Kalman Shan DO Entered By: Kalman Shan on 11/25/2021 16:59:50 [name], [name] ([id number]) -------------------------------------------------------------------------------- Progress Note Details Patient Name: Heather Dear A. Date of Service: 11/24/2021 11:15 AM Medical Record Number: [id number] Patient Account Number: [id number] Date of Birth/Sex: January 03, 1976 (46 y.o. F) Treating RN: Levora Dredge Primary Care Provider: Raelene Bott Other Clinician: Referring Provider: Raelene Bott Treating Provider/Extender: Yaakov Guthrie in Treatment: 73 Subjective Chief Complaint Information obtained from Patient 03/19/2021; patient referred by Dr. Luana Shu who has been looking after her left foot for quite a period of time for review of a nonhealing area in the left midfoot History of Present Illness (HPI) 01/18/18-She is here for initial evaluation of the left great toe ulcer. She is a poor historian in regards to timeframe in detail. She states approximately 4 weeks ago she lacerated her toe on something in the house. She followed up with her primary care who placed her on Bactrim and ultimately a second dose of Bactrim prior to coming to wound clinic. She states she has been treating the toe with peroxide, Betadine and a Band-Aid. She did not check her blood sugar this morning but checked it yesterday morning it was 327; she is unaware of a recent A1c and there are no current records. She saw Dr. she would've orthopedics last week for an old injury to the left ankle, she states he did not see her toe, nor did she bring it to his attention. She smokes approximately 1 pack cigarettes a day. Her social situation is concerning, she arrives this morning with her mother who appears extremely intoxicated/under the influence; her mother was asked to leave the room and be monitored by the patient's grandmother. The patient's aunt then accompanied the patient and the room throughout the rest of the appointment. We had a lengthy discussion regarding the deleterious effects of uncontrolled hyperglycemia and smoking as it relates to wound healing and overall health. She was strongly encouraged to decrease her smoking and get her diabetes under better control. She states she is currently on  a diet and has cut down her [employer] consumption. The left toe is erythematous, macerated and slightly edematous with malodor present. The edema in her left foot is below her baseline, there is no erythema streaking. We will treat her with  Santyl, doxycycline; we have ordered and xray, culture and provided a Peg assist surgical shoe and cultured the wound. 01/25/18-She is here in follow-up evaluation for a left great toe ulcer and presents with an abscess to her suprapubic area. She states her blood sugars remain elevated, feeling "sick" and if levels are below 250, but she is trying. She has made no attempt to decrease her smoking stating that we "can't take away her food in her cigarettes". She has been compliant with offloading using the PEG assist you. She is using Santyl daily. the culture obtained last week grew staph aureus and Enterococcus faecalis; continues on the doxycycline and Augmentin was added on Monday. The suprapubic area has erythema, no femoral variation, purple discoloration, minimal induration, was accessed with a cotton tip applicator with sanguinopurulent drainage, this was cultured, I suspect the current antibiotic treatment will cover and we will not add anything to her current treatment plan. She was advised to go to urgent care or ER with any change in redness, induration or fever. 02/01/18-She is here in follow-up evaluation for left great toe ulcers and a new abdominal abscess from last week. She was able to use packing until earlier this week, where she "forgot it was there". She states she was feeling ill with GI symptoms last week and was not taking her antibiotic. She states her glucose levels have been predominantly less than 200, with occasional levels between 200-250. She thinks this was contributing to her GI symptoms as they have resolved without intervention. There continues to be significant laceration to left toe, otherwise it clinically looks stable/improved. There is now less superficial opening to the lateral aspect of the great toe that was residual blister. We will transition to [facility] to all wounds, she will continue her Augmentin. If there is no change or deterioration next week for  reculture. 02/08/18-She is here in follow-up evaluation for left great toe ulcer and abdominal ulcer. There is an improvement in both wounds. She has been wrapping her left toe with coban, not by our direction, which has created an area of discoloration to the medial aspect; she has been advised to NOT use coban secondary to her neuropathy. She states her glucose levels have been high over this last week ranging from 200-350, she continues to smoke. She admits to being less compliant with her offloading shoe. We will continue with same treatment plan and she will follow-up next week. 02/15/18-She is here in follow-up evaluation for left great toe ulcer and abdominal ulcer. The abdominal ulcer is epithelialized. The left great toe ulcer is improved and all injury from last week using the Coban wrap is resolved, the lateral ulcer is healed. She admits to noncompliance with wearing offloading shoe and admits to glucose levels being greater than 300 most of the week. She continues to smoke and expresses no desire to quit. There is one area medially that probes deeper than it has historically, erythema to the toe and dorsal foot has consistently waxed and waned. There is no overt signs of cellulitis or infection but we will culture the wound for any occult infection given the new area of depth and erythema. We will hold off on sensitivities for initiation of antibiotic therapy. 02/22/18-She is here  in follow up evaluation for left great toe ulcer. There is overall significant improvement in both wound appearance, erythema and edema with changes made last week. She was not initiated on antibiotic therapy. Culture obtained last week showed oxacillin sensitive staph aureus, sensitive to clindamycin. Clindamycin has been called into the pharmacy but she has been instructed to hold off on initiation secondary to overall clinical improvement and her history of antibiotic intolerance. She has been instructed to  contact the clinic with any noted changes/deterioration and the wound, erythema, edema and/or pain. She will follow-up next week. She continues to smoke and her glucose levels remain elevated >250; she admits to compliance with offloading shoe 03/01/18 on evaluation today patient appears to be doing fairly well in regard to her left first toe ulcer. She has been tolerating the dressing changes with the [facility] Dressing without complication and overall this has definitely showed signs of improvement according to records as well is what the patient tells me today. I'm very pleased in that regard. She is having no pain today 03/08/18 She is here for follow up evaluation of a left great toe ulcer. She remains non-compliant with glucose control and smoking cessation; glucose levels consistently >200. She states that she got new shoe inserts/peg assist. She admits to compliance with offloading. Since my last evaluation there is significant improvement. We will switch to prisma at this time and she will follow up next week. She is noted to be tachycardic at this appointment, heart rate 120s; she has a history of heart rate 70-130 according to our records. She admits to extreme agitation r/t personal issues; she was advised to monitor her heartrate and contact her physician if it does not return to a more normal range (<100). She takes cardizem twice daily. 03/15/18-She is here in follow-up evaluation for left great toe ulcer. She remains noncompliant with glucose control and smoking cessation. She admits to compliance with wearing offloading shoe. The ulcer is improved/stable and we will continue with the same treatment plan and she will follow-up next week 03/22/18-She is here for evaluation for left great toe ulcer. There continues to be significant improvement despite recurrent hyperglycemia (over 500 yesterday) and she continues to smoke. She has been compliant with offloading and we will continue  with same treatment plan and she will follow-up next week. [name], [name] ([id number]) 03/29/18-She is here for evaluation for left great toe ulcer. Despite continuing to smoke and uncontrolled diabetes she continues to improve. She is compliant with offloading shoe. We will continue with the same treatment plan and she will follow-up next week 04/05/18- She is here in follow up evaluation for a left great toe ulcer; she presents with small pustule to left fifth toe (resembles ant bite). She admits to compliance with wearing offloading shoe; continues to smoke or have uncontrolled blood glucose control. There is more callus than usual with evidence of bleeding; she denies known trauma. 04/12/18-She is here for evaluation of left great toe ulcer. Despite noncompliance with glycemic control and smoking she continues to make improvement. She continues to wear offloading shoe. The pustule, that was identified last week, to the left fifth toe is resolved. She will follow-up in 2 weeks 05/03/18-she is seen in follow-up evaluation for a left great toe ulcer. She is compliant with offloading, otherwise noncompliant with glycemic control and smoking. She has plateaued and there is minimal improvement noted. We will transition to [facility], replaced the insert to her surgical shoe and she will  follow-up in one week 05/10/18- She is here in follow up evaluation for a left great toe ulcer. It appears stable despite measurement change. We will continue with same treatment plan and follow up next week. 05/24/18-She is seen in follow-up evaluation for a left great toe ulcer. She remains compliant with offloading, has made significant improvement in her diet, decreasing the amount of sugar/soda. She said her recent A1c was 10.9 which is lower than. She did see a diabetic nutritionist/educator yesterday. She continues to smoke. We will continue with the same treatment plan and she'll follow-up next week. 05/31/18-  She is seen in follow-up evaluation for left great toe ulcer. She continues to remain compliant with offloading, continues to make improvement in her diet, increasing her water and decreasing the amount of sugar/soda. She does continue to smoke with no desire to quit. We will apply Prisma to the depth and Hydrofera Blue over. We have not received insurance authorization for oasis. She will follow up next week. 06/07/18-She is seen in follow-up evaluation for left great toe ulcer. It has stalled according to today's measurements although base appears stable. She says she saw a diabetic educator yesterday; her average blood sugars are less than 300 which is an improvement for her. She continues to smoke and states "that's my next step" She continues with water over soda. We will order for xray, culture and reinstate ace wrap compression prior to placing apligraf for next week. She is voicing no complaints or concerns. Her dressing will change to iodoflex over the next week in preparation for apligraf. 06/14/18-She is seen in follow-up evaluation for left great toe ulcer. Plain film x-ray performed last week was negative for osteomyelitis. Wound culture obtained last week grew strep B and OSSA; she is initiated on keflex and cefdinir today; there is erythema to the toe which could be from ace wrap compression, she has a history of wrapping too tight and has has been encouraged to maintain ace wraps that we place today. We will hold off on application of apligraf today, will apply next week after antibiotic therapy has been initiated. She admits today that she has resumed taking a shower with her foot/toe submerged in water, she has been reminded to keep foot/toe out of the bath water. She will be seen in follow up next week 06/21/18-she is seen in follow-up evaluation for left great toe ulcer. She is tolerating antibiotic therapy with no GI disturbance. The wound is stable. Apligraf was applied today. She  has been decreasing her smoking, only had 4 cigarettes yesterday and 1 today. She continues being more compliant in diabetic diet. She will follow-up next week for evaluation of site, if stable will remove at 2 weeks. 06/28/18- She is here in follow up evalution. Apligraf was placed last week, she states the dressing fell off on Tuesday and she was dressing with hydrofera blue. She is healed and will be discharged from the clinic today. She has been instructed to continue with smoking cessation, continue monitoring glucose levels, offloading for an additional 4 weeks and continue with hydrofera blue for additional two weeks for any possible microscopic opening. Readmission: 08/07/18 on evaluation today patient presents for reevaluation concerning the ulcer of her right great toe. She was previously discharged on 06/28/18 healed. Nonetheless she states that this began to show signs of drainage she subsequently went to her primary care provider. Subsequently an x-ray was performed on 08/01/18 which was negative. The patient was also placed on antibiotics at that  time. Fortunately they should have been effective for the infection. Nonetheless she's been experiencing some improvement but still has a lot of drainage coming from the wound itself. 08/14/18 on evaluation today patient's wound actually does show signs of improvement in regard to the erythema at this point. She has completed the antibiotics. With that being said we did discuss the possibility of placing her in a total contact cast as of today although I think that I may want to give this just a little bit more time to ensure nothing recurrence as far as her infection is concerned. I do not want to put in the cast and risk infection at that time if things are not completely resolved. With that being said she is gonna require some debridement today. 08/21/18 on evaluation today patient actually appears to be doing okay in regard to her toe ulcer.  She's been tolerating the dressing changes without complication. With that being said it does appear that she is ready and in fact I think it's appropriate for [place] to go ahead and initiate the total contact cast today. Nonetheless she will require some sharp debridement to prepare the wound for application. Overall I feel like things have been progressing well but we do need to do something to get this to close more readily. 08/24/18 patient seen today for reevaluation after having had the total contact cast applied on Tuesday. She seems to have done very well the wound appears to be doing great and overall I'm pleased with the progress that she's made. There were no abnormal areas of rubbing from the cast on her lower extremity. 08/30/18 on evaluation today patient actually appears to be completely healed in regard to her plantar toe ulcer. She tells me at this point she's been having a lot of issues with the cast. She almost fell a couple of times the state shall the step of her dog a couple times as well. This is been a very frustrating process for her other nonetheless she has completely healed the wound which is excellent news. Overall there does not appear to be the evidence of infection at this time which is great news. 09/11/18 evaluation today patient presents for follow-up concerning her great toe ulcer on the left which has unfortunately reopened since I last saw her which was only a couple of weeks ago. Unfortunately she was not able to get in to get the shoe and potentially the AFO that's gonna be necessary due to her left foot drop. She continues with offloading shoe but this is not enough to prevent her from reopening it appears. When we last had her in the total contact cast she did well from a healing standpoint but unfortunately the wound reopened as soon as she came out of the cast within just a couple of weeks. 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 This is also part of the reason why she falls on a regular basis. I just do not believe that is gonna be safe for [place] to reinitiate the total contact cast as last time we had this on she fell 3 times one week which is definitely not normal for her. 09/18/18 upon evaluation today the patient actually appears to be doing about the same in regard to her  toe ulcer. She did not contact [employer] as I asked her to even though I had given her the prescription. In fact she actually states that she has no idea where the prescription is. She did apparently call [employer] and they told her that all she needed to do was bring the prescription in order to be able to be seen and work on getting the AFO for her left foot. With all that being said she still does not have an appointment and I'm not sure were things stand that regard. I will give her a new prescription today in order to contact them to get this set up. [name], [name] ([id number]) 09/25/18 on evaluation today patient actually appears to be doing about the same in regard to her toes ulcer. She does have a small areas which seems to have a lot of callous buildup around the edge of the wound which is going to need sharp debridement today. She still is waiting to be scheduled for evaluation with [employer] for possibility of an AFO. She states there supposed to call her tomorrow to get this set up. Unfortunately it does appear that her foot specifically the toe area is showing signs of erythema. There does not appear to be any systemic infection which is in these good news. 10/02/18 on evaluation today patient actually appears to be doing about the same in regard to her toe ulcer. This really has not done too well although it's not significantly larger it's also not significantly smaller. She  has been tolerating the dressing changes without complication. She actually has her appointment with [employer] and [facility] tomorrow to hopefully be measured for obtaining and AFO splint. I think this would be helpful preventing this from reoccurring. We had contemplated starting the cast this week although to be honest I am reluctant to do that as she's been having nausea, vomiting, and seizure activity over the past three days. She has a history of seizures and have been told is nothing that can be done for these. With that being said I do believe that along with the seizures have the nausea vomiting which upon further questioning doesn't seem to be the normal for her and makes me concerned for the possibility of infection or something else going on. I discussed this with the patient and her mother during the office visit today. I do not feel the wound is effective but maybe something else. The responses this was "this just happens to her at times and we don't know why". They did not seem to be interested in going to the hospital to have this checked out further. 10/09/18 on evaluation today patient presents for follow-up concerning her ongoing toe ulcer. She has been tolerating the dressing changes without complication. Fortunately there does not appear to be any evidence of infection which is great news however I do think that the patient would benefit from going ahead for with the total contact cast. She's actually in a wheelchair today she tells me that she will use her walker if we initiate the cast. I was very specific about the fact that if we were gonna do the cast I wanted to make sure that she was using the walker in order to prevent any falls. She tells me she does not have stairs that she has to traverse on a regular basis at her home. She has not had any seizures since last week again that something that happens to her often she tells me she did talk to  [employer] and they said that it may  take up to three weeks to get the brace approved for her. Hopefully that will not take that long but nonetheless in the meantime I do think the cast could be of benefit. 10/12/18 on evaluation today patient appears to be doing rather well in regard to her toe ulcer. It's just been a few days and already this is significantly improved both as far as overall appearance and size. Fortunately there's no sign of infection. She is here for her first obligatory cast change. 10/19/18 Seen today for follow up and management of left great toe ulcer. Wound continues to show improvement. Noted small open area with seroussang drainage with palpation. Denies any increased pain or recent fevers during visit. She will continue calcium alginate with offloading shoe. Denies any questions or concerns during visit. 10/26/18 on evaluation today patient appears to be doing about the same as when I last saw her in regard to her wound bed. Fortunately there does not appear to be any signs of infection. Unfortunately she continues to have a breakdown in regard to the toe region any time that she is not in the cast. It takes almost no time at all for this to happen. Nonetheless she still has not heard anything from the brace being made by [employer] as to when exactly this will be available to her. Fortunately there is no signs of infection at this time. 10/30/18 on evaluation today patient presents for application of the total contact cast as we just received him this morning. Fortunately we are gonna be able to apply this to her today which is great news. She continues to have no significant pain which is good news. Overall I do feel like things have been improving while she was the cast is when she doesn't have a cast that things get worse. She still has not really heard anything from [facility] regarding her brace. 11/02/18 upon evaluation today patient's wound already appears to be doing significantly better which is good news.  Fortunately there does not appear to be any signs of infection also good news. Overall I do think the total contact cast as before is helping to heal this area unfortunately it's just not gonna likely keep the area closed and healed without her getting her brace at least. Again the foot drop is a significant issue for her. 11/09/18 on evaluation today patient appears to be doing excellent in regard to her toe ulcer which in fact is completely healed. Fortunately we finally got the situation squared away with the paperwork which was needed to proceed with getting her brace approved by Medicaid. I have filled that out unfortunately that information has been sent to the orthopedic office that I worked at 2 1/2 years ago and not tired Current wound care measures. Fortunately she seems to be doing very well at this time. 11/23/18 on evaluation today patient appears to be doing More Poorly Compared to Last Time I Saw Her. At [facility] She Had Completely Healed. Currently she is continuing to have issues with reopening. She states that she just found out that the brace was approved through Medicaid now she just has to go get measured in order to have this fitted for her and then made. Subsequently she does not have an appointment for this yet that is going to complicate things we obviously cannot put her back in the cast if we do not have everything measured because they're not gonna be able to measure  her foot while she is in the cast. 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 Obviously this is unfortunate and does have me somewhat worried as well. 11/30/18 on evaluation today patient's toe ulcer actually appears to be doing fairly well. The good news is she will be getting her brace in the shoes next week on Wednesday. Hopefully we will be able to get this to heal without having to go back in the cast however  she may need the cast in order to get the wound completely heal and then go from there. Fortunately there's no signs of infection at this time. 12/07/18 on evaluation today patient fortunately did receive her brace and she states she could tell this definitely makes her walk better. With that being said she's been having issues with her toe where she noticed yesterday there was a lot of tissue that was loosing off this appears to be much larger than what it was previous. She also states that her leg has been read putting much across the top of her foot just about the ankle although this seems to be receiving somewhat. The total area is still red and appears to be someone infected as best I can tell. She is previously taken Bactrim and that may be a good option for her today as well. We are gonna see what I wound culture shows as well and I think that this is definitely appropriate. With that being said outside of the culture I still need to initiate something in the interim and that's what I'm gonna go ahead and select Bactrim is a good option for her. 12/14/18 on evaluation today patient appears to be doing better in regard to her left great toe ulcer as compared to last week's evaluation. There's still some erythema although this is significantly improved which is excellent news. Overall I do believe that she is making good progress is still gonna take some time before she is where I would like her to be from the standpoint of being able to place her back into the total contact cast. Hopefully we will be where we need to be by next week. 12/21/18 on evaluation today patient actually appears to be doing poorly in regard to her toe ulcer. She's been tolerating the dressing changes without complication. Fortunately there's no signs of systemic infection although she does have a lot of drainage from the toe ulcer and this does [name], [name] ([id number]) seem to be causing some issues at this point. She  does have erythema on the distal portion of her toe that appears to be likely cellulitis. 12/28/18 on evaluation today patient actually appears to be doing a little better in my pinion in regard to her toe ulcer. With that being said she still does have some evidence of infection at this time and for her culture she had both E. coli as well as enterococcus as organisms noted on evaluation. For that reason I think that though the Keflex likely has treated the E. coli rather well this has really done nothing for the enterococcus. We are going to have to initiate treatment for this specifically. 01/04/19 on evaluation today patient's toe actually appears to be doing better from the standpoint of infection. She currently would like to see about putting the cash back on I think that this is appropriate as long as she takes care of it and  keeps it from getting wet. She is gonna have some drainage we can definitely pass this up with Drawtex and alginate to try to prevent as much drainage as possible from causing the problems. With that being said I do want to at least try her with the cast between now and Tuesday. If there any issues we can't continue to use it then I will discontinue the use of the cast at that point. 01/08/19 on evaluation today patient actually appears to be doing very well as far as her foot ulcer specifically the great toe on the left is concerned. She did have an area of rubbing on the medial aspect of her left ankle which again is from the cast. Fortunately there's no signs of infection at this point in this appears to be a very slight skin breakdown. The patient tells me she felt it rubbing but didn't think it was that bad. Fortunately there is no signs of active infection at this time which is good news. No fevers, chills, nausea, or vomiting noted at this time. 01/15/19 on evaluation today patient actually appears to be doing well in regard to her toe ulcer. Again as previous she seems  to do well and she has the cast on which indicates to me that during the time she doesn't have a cast on she's putting way too much pressure on this region. Obviously I think that's gonna be an issue as with the current national emergency concerning the Covid-19 Virus it has been recommended that we discontinue the use of total contact casting by the chief medical officer of our company, Dr. Simona Huh. The reasoning is that if a patient becomes sick and cannot come into have the cast removed they could not just leave this on for an additional two weeks. Obviously the hospitals also do not want to receive patient's who are sick into the emergency department to potentially contaminate the region and spread the Covid-19 Virus among other sick individuals within the hospital system. Therefore at this point we are suspending the use of total contact cast until the current emergency subsides. This was all discussed with the patient today as well. 01/22/19 on evaluation today patient's wound on her left great toe appears to be doing slightly worse than previously noted last week. She tells me that she has been on this quite a bit in fact she tells me she's been awake for 38 straight hours. This is due to the fact that she's having to care for grandparents because nobody else will. She has been taking care of them for five the last seven days since I've seen her they both have dementia his is from a stroke and her grandmother's was progressive. Nonetheless she states even her mom who knows her condition and situation has only help two of those days to take care of them she's been taking care of the rest. Fortunately there does not appear to be any signs of active infection in regard to her toe at this point although obviously it doesn't look as good as it did previous. I think this is directly related to her not taking off the pressure and friction by way of taking things easy. Though I completely understand  what's going on. 01/29/19 on evaluation today patient's tools are actually appears to be showing some signs of improvement today compared to last week's evaluation as far as not necessarily the overall size of the wound but the fact that she has some new skin growth in between  the two ends of the wound opening. Overall I feel like she has done well she states that she had a family member give her what sounds to be a CAM walker boot which has been helpful as well. 02/05/19 on evaluation today patient's wound bed actually appears to be doing significantly better in regard to her overall appearance of the size of the wound. With that being said she is still having an issue with offloading efficiently enough to get this to close. Apparently there is some signs of infection at this point as well unfortunately. Previously she's done well of Augmentin I really do not see anything that needs to be culture currently but there theme and cellulitis of the foot that I'm seeing I'm gonna go ahead and place her on an antibiotic today to try to help clear this up. 02/12/2019 on evaluation today patient actually appears to be doing poorly in regard to her overall wound status. She tells me she has been using her offloading shoe but actually comes in today wearing her tennis shoe with the AFO brace. Again as I previously discussed with her this is really not sufficient to allow the area to heal appropriately. Nonetheless she continues to be somewhat noncompliant and I do wonder based on what she has told my nurse in the past as to whether or not a good portion of this noncompliance may be recreational drug and alcohol related. She has had a history of heroin overdose and this was fairly recently in the past couple of months that have been seeing her. Nonetheless overall I feel like her wound looks significantly worse today compared to what it was previous. She still has significant erythema despite the Augmentin I am not  sure that this is an appropriate medication for her infection I am also concerned that the infection may have gone down into her bone. 02/19/19 on evaluation today patient actually appears to be doing about the same in regard to her toe ulcer. Unfortunately she continues to show signs of bone exposure and infection at this point. There does not appear to be any evidence of worsening of the infection but I'm also not really sure that it's getting significantly better. She is on the Augmentin which should be sufficient for the Staphylococcus aureus infection that she has at this point. With that being said she may need IV antibiotics to more appropriately treat this. We did have a discussion today about hyperbaric option therapy. 02/28/19 on evaluation today patient actually appears to be doing much worse in regard to the wound on her left great toe as compared to even my previous evaluation last week. Unfortunately this seems to be training in a pretty poor direction. Her toe was actually now starting to angle laterally and I can actually see the entire joint area of the proximal portion of the digit where is the distal portion of the digit again is no longer even in contact with the joint line. Unfortunately there's a lot more necrotic tissue around the edge and the toe appears to be showing signs of becoming gangrenous in my pinion. I'm very concerned about were things stand at this point. She did see infectious disease and they are planning to send in a prescription for Sivextro for her and apparently this has been approved. With that being said I don't think she should avoid taking this but at the same time I'm not sure that it's gonna be sufficient to save her toe at this point. She tells  me that she still having to care for grandparents which I think is putting quite a bit of strain on her foot and specifically the total area and has caused this to break down even to a greater degree than would've  otherwise been expected. 03/05/19 on evaluation today patient actually appears to be doing quite well in regard to her toe all things considering. She still has bone exposed but there appears to be much less your thing on overall the appearance of the wound and the toe itself is dramatically improved. She still does have some issues currently obviously with infection she did see vascular as well and there concerned that her blood flow to the toad. For that reason they are setting up for an angiogram next week. 03/14/19 on evaluation today patient appears to be doing very poor in regard to her toe and specifically in regard to the ulceration and the fact that she's starting to notice the toe was leaning even more towards the lateral aspect and the complete joint is visible on the proximal aspect of the joint. Nonetheless she's also noted a significant odor and the tip of the toe is turning more dark and necrotic appearing. Overall I think she is getting worse not better as far as this is concerned. For that reason I am recommending at this point that she likely needs to be seen for likely amputation. [name], [name] ([id number]) READMISSION 03/19/2021 This is a patient that we cared for in this clinic for a prolonged period of time in 2019 and 2020 with a left foot and left first toe wound. 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 Luana Shu. In December 2021 she had an ulcer on her right great toe as well as the fourth and fifth toes. 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 She also had an angiogram at that time and underwent angioplasty of the right anterior tibial artery. In any case she claims that the wound on the right foot is closed I did not look at this today which was probably an oversight although I think that should be done next week.  After her surgery she developed a dehiscence but I do not see any follow-up of this. According to Dr. Deborra Medina last review that she was out of the area being cared for by another physician but recently came back to his attention. 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 A culture of this area showed E. coli apparently before she came back to see Dr. Luana Shu she was supposed to be receiving antibiotics but she did not really take them. Nor is she offloading this area at all. Finally her last hemoglobin A1c listed in epic was in March 2022 at 14.1 she says things are a lot better since then although I am not sure. 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 She had this wound at the time. During this admission she had E. coli sepsis a TEE was negative. 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 She is still a heavy cigarette smoker. She has a history  of bipolar disorder seizure disorder ABI in our clinic was 1.05 6/1; left midfoot in the setting of a TMA done previously. Round circular wound with a "knuckle" of protruding tissue. The problem is that the knuckle was not attached to any of the surrounding granulation and this probed proximally widely I removed a large portion of this tissue. This wound goes with considerable undermining laterally. I do not feel any bone there was no purulence but this is a deep wound. 6/8; in spite of the debridement I did last week. She arrives with a wound looking exactly the same. A protruding "knuckle" of tissue nonadherent to most of the surrounding tissue. There is considerable depth around this from 6-12 o'clock at  2.7 cm and undermining of 1 cm. This does not look overtly infected and the x-ray I did last week was negative for any osseous abnormalities. We have been using silver collagen 6/15; deep tissue culture I did last week showed moderate staph aureus and moderate Pseudomonas. This will definitely require prolonged antibiotic therapy. The pathology on the protuberant area was negative for malignancy fungus etc. 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 We have been using silver collagen. I am going to be prescribing Levaquin for 2 weeks. Her CT scan of the foot is down for 7/5 6/22; CT scan of the foot on 7 5. She says she has hardware in the left leg from her previous fracture. She is on the Levaquin for the deep tissue culture I did that showed methicillin sensitive staph aureus and Pseudomonas. I gave her a 2-week supply and she will have another week. She arrives in clinic today with the same protuberant tissue however this is nonadherent to the tissue surrounding it. I am really at a loss to explain this unless there is underlying deep tissue infection 6/29; patient presents for 1 week follow-up. She has been using collagen to the wound bed. She reports taking her antibiotics as prescribed.She has no complaints or issues today. She denies signs of infection. 7/6; patient presents for one week followup. She has been using collagen to the wound bed. She states she is taking Levaquin however at times she is not able to keep it down. She denies signs of infection. 7/13; patient presents for 1 week follow-up. She has been using silver alginate to the wound bed. She still has nausea when taking her antibiotics. She denies signs of infection. 7/20; patient presents for 1 week follow-up. She has been using silver alginate with gentamicin cream to the wound bed. She denies any issues and has no complaints today. She denies signs of  infection. 7/27; patient presents for 1 week follow-up. She continues to use silver alginate with gentamicin cream to the wound bed. She reports starting her antibiotics. She has no issues or complaints. Overall she reports stability to the wound. 8/3; patient presents for 1 week follow-up. She has been using silver alginate with gentamicin cream to the wound bed. She reports completing all antibiotics. She has no issues or complaints today. She denies signs of infection. 8/17; patient presents for 2-week follow-up. He is to use silver alginate to the wound bed. She has no issues or complaints today. She denies signs of infection. She reports her pain has improved in her foot since last clinic visit 8/24; patient presents for 1 week follow-up. She continues to use silver alginate to the wound bed. She has no issues or complaints. She denies signs of infection.  Pain is stable. 9/7; patient presents for follow-up. She missed her last week appointment due to feeling ill. She continues to use silver alginate. She has a new wound to the right lower extremity that is covered in eschar. She states It occurred over the past week and has no idea how it started. She currently denies signs of infection. 9/14; patient presents for follow-up. To the left foot wound she has been using gentamicin cream and silver alginate. To the right lower extremity wound she has been keeping this covered and has not obtain Santyl. 9/21; patient presents for follow-up. She reports using gentamicin cream and silver alginate to the left foot and Santyl to the right lower extremity wound. She has no issues or complaints today. She denies signs of infection. 9/28; patient presents for follow-up. She reports a new wound to her right heel. She states this occurred a few days ago and is progressively gotten worse. She has been trying to clean the area with a Q-tip and Santyl. She reports stability in the other 2 wounds. She has been  using gentamicin cream and silver alginate to the left foot and Santyl to the right lower extremity wound. 10/12; patient presents for follow-up. She reports improvement to the wound beds. She is seeing vein and vascular to discuss the potential of a left BKA. She states they are going to do an arteriogram. She continues to use silver alginate with dressing changes to her wounds. 11/2; patient presents for follow-up. She states she has not been doing dressing changes to the wound beds. She states she is not able to offload the areas. She reports chronic pain to her left foot wound. 11/9; patient presents for follow-up. She came in with only socks on. She states she forgot to put on shoes. It is unclear if she is doing any dressing changes. She currently denies systemic signs of infection. 11/16; patient presents for follow-up. She came again only with socks on. She states she does not wear shoes ever. It is unclear if she does dressing changes. She currently denies systemic signs of infection. 11/23; patient presents for follow-up. She wore her shoes today. It still unclear exactly what dressing she is using for each wound but she did states she obtained Dakin's solution and has been using this to the left foot wound. She currently denies signs of infection. [name], [name] ([id number]) 11/30; patient presents for follow-up. She has no issues or complaints today. She currently denies signs of infection. 12/7; patient presents for follow-up. She has no issues or complaints today. She has been using Hydrofera Blue to the right heel wound and Dakin solution to the left foot wound. Her right anterior leg wound is healed. She currently denies signs of infection. 12/14; patient presents for follow-up. She has been using Hydrofera Blue to the right heel and Dakin's to the left foot wounds. She has no issues or complaints today. She denies signs of infection. 12/21; patient presents for follow-up. She  reports using Hydrofera Blue to the right heel and Dakin's to the left foot wound. She denies signs of infection. 12/28; patient presents for follow-up. She continues to use Dakin's to the left foot wound and Hydrofera Blue to the right heel wound. She denies signs of infection. 1/4; patient presents for follow-up. She has no issues or complaints today. She denies signs of infection. 1/11; patient presents for follow-up. It is unclear if she has been dressing these wounds over the past week. She currently denies signs  of infection. 1/18; patient presents for follow-up. She states she has been using Dakin's wet-to-dry dressings to the left foot. She has been using Hydrofera Blue to the right foot foot wound. She states that the anterior right leg wound has reopened and draining serous fluid. She denies signs of infection. 1/25; patient presents for follow-up. She has no issues or complaints today. 2/1; patient presents for follow-up. She has no issues or complaints today. She denies signs of infection. Objective Constitutional Vitals Time Taken: 11:42 AM, Height: 69 in, Weight: 185 lbs, BMI: 27.3, Temperature: 98.2 F, Pulse: 77 bpm, Respiratory Rate: 18 breaths/min, Blood Pressure: 124/79 mmHg. General Notes: Left foot (Transmetatarsal amputation): Wound to the plantar aspect that has granulation tissue and scant nonviable tissue and increased pockets of depth. There is circumferential callus. Right foot: To the heel there is an open wound with Granulation tissue , And scant nonviable tissue with Circumferential callus. No obvious signs of infection to any wound. Right lower extremity: To the anterior aspect there is an open wound with dried nonviable tissue tightly adhered Integumentary (Hair, Skin) Wound #10 status is Open. Original cause of wound was Gradually Appeared. The date acquired was: 11/04/2021. The wound has been in treatment 2 weeks. The wound is located on the Right,Midline Lower  Leg. The wound measures 0.9cm length x 0.6cm width x 0.1cm depth; 0.424cm^2 area and 0.042cm^3 volume. There is Fat Layer (Subcutaneous Tissue) exposed. There is no tunneling or undermining noted. There is a none present amount of drainage noted. There is no granulation within the wound bed. There is a large (67-100%) amount of necrotic tissue within the wound bed including Eschar. Wound #7 status is Open. Original cause of wound was Gradually Appeared. The date acquired was: 08/07/2020. The wound has been in treatment 35 weeks. The wound is located on the [place]. The wound measures 4cm length x 1.5cm width x 0.3cm depth; 4.712cm^2 area and 1.414cm^3 volume. There is Fat Layer (Subcutaneous Tissue) exposed. There is no tunneling or undermining noted. There is a large amount of serosanguineous drainage noted. The wound margin is thickened. There is large (67-100%) red, pink, hyper - granulation within the wound bed. There is a small (1-33%) amount of necrotic tissue within the wound bed including Adherent Slough. Wound #9 status is Open. Original cause of wound was Gradually Appeared. The date acquired was: 07/18/2021. The wound has been in treatment 18 weeks. The wound is located on the Right Calcaneus. The wound measures 0.6cm length x 0.7cm width x 0.2cm depth; 0.33cm^2 area and 0.066cm^3 volume. There is Fat Layer (Subcutaneous Tissue) exposed. There is no tunneling or undermining noted. There is a medium amount of serosanguineous drainage noted. The wound margin is well defined and not attached to the wound base. There is small (1-33%) pink granulation within the wound bed. There is a large (67-100%) amount of necrotic tissue within the wound bed including Eschar and Adherent Slough. Assessment Active Problems ICD-10 Ivancic, Rolene A. 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 I debrided nonviable tissue to the right heel wound. No signs of infection on exam.  I recommended continuing with previous treatment of Hydrofera Blue to the right heel, Dakin's wet-to-dry dressings to the left foot and Santyl to the right anterior leg wound. Procedures Wound #9 Pre-procedure diagnosis of Wound #9 is a Diabetic Wound/Ulcer of the Lower Extremity located on the Right Calcaneus .Severity of Tissue Pre Debridement is: Fat layer exposed. There was a Excisional Skin/Subcutaneous Tissue Debridement with a total area of 0.42 sq cm performed by Kalman Shan, MD. With the following instrument(s): Curette Material removed includes Subcutaneous Tissue and Slough and after achieving pain control using Lidocaine 4% Topical Solution. No specimens were taken. A time out was conducted at 12:06, prior to the start of the procedure. A Minimum amount of bleeding was controlled with Pressure. The procedure was tolerated well. Post Debridement Measurements: 0.6cm length x 0.7cm width x 0.2cm depth; 0.066cm^3 volume. Character of Wound/Ulcer Post Debridement is stable. Severity of Tissue Post Debridement is: Fat layer exposed. Post procedure Diagnosis Wound #9: Same as Pre-Procedure Plan Follow-up Appointments: Return Appointment in 1 week. Bathing/ Shower/ Hygiene: Wash wounds with antibacterial soap and water. Off-Loading: Open toe surgical shoe with peg assist. Turn and reposition every 2 hours Other: - peg shoe right, off-loader left Medications-Please add to medication list.: P.O. Antibiotics Other: - per MD [place] ok to leave right lower leg dressing on for  3-4 days and then continue daily WOUND #10: - Lower Leg Wound Laterality: Right, Midline Topical: Santyl Collagenase Ointment, 30 (gm), tube 1 x Per Day/30 Days Discharge Instructions: apply nickel thick to wound bed only Primary Dressing: Zetuvit Plus Silicone Border Dressing 4x4 (in/in) 1 x Per Day/30 Days WOUND #7: - Foot Wound Laterality: Plantar, Left, Midline Cleanser: Dakin 16 (oz) 0.25 1 x Per Day/30 Days Discharge Instructions: Use as directed. Primary Dressing: Gauze 1 x Per Day/30 Days Discharge Instructions: As directed: moistened with Dakins Solution Secondary Dressing: Kerlix 4.5 x 4.1 (in/yd) 1 x Per Day/30 Days Discharge Instructions: Apply Kerlix 4.5 x 4.1 (in/yd) as instructed Secured With: Kerlix Roll Sterile or Non-Sterile 6-ply 4.5x4 (yd/yd) 1 x Per Day/30 Days Discharge Instructions: Apply Kerlix as directed WOUND #9: - Calcaneus Wound Laterality: Right Cleanser: Soap and Water 1 x Per Day/30 Days Discharge Instructions: Gently cleanse wound with antibacterial soap, rinse and pat dry prior to dressing wounds Primary Dressing: Hydrofera Blue Ready Transfer Foam, 2.5x2.5 (in/in) 1 x Per Day/30 Days Discharge Instructions: Apply Hydrofera Blue Ready to wound bed as directed Secondary Dressing: Zetuvit Plus Silicone Border Dressing 4x4 (in/in) 1 x Per Day/30 Days Secured With: coverlet 1 x Per Day/30 Days 1. In office sharp debridement 2. Hydrofera Blue 3. Santyl 4. Dakin's wet-to-dry 5. Aggressive offloading 6. Follow-up in 1 week Messamore, Irem A. ([id number]) Electronic Signature(s) Signed: 11/25/2021 5:04:16 PM By: Kalman Shan DO Entered By: Kalman Shan on 11/25/2021 17:02:20 [name] ([id number]) -------------------------------------------------------------------------------- ROS/PFSH Details Patient Name: Heather Dear A. Date of Service: 11/24/2021 11:15 AM Medical Record Number: [id number] Patient Account Number: [id number] Date of  Birth/Sex: 09-26-1976 (46 y.o. F) Treating RN: Levora Dredge Primary Care Provider: Raelene Bott Other Clinician: Referring Provider: Raelene Bott Treating Provider/Extender: Yaakov Guthrie in Treatment: 62 Information Obtained From Patient Ear/Nose/Mouth/Throat Medical History: Positive for: Chronic sinus problems/congestion; Middle ear problems Hematologic/Lymphatic Medical History: Positive for: Anemia Respiratory Medical History: Positive for: Chronic Obstructive Pulmonary Disease (COPD) Cardiovascular Medical History: Positive for: Congestive Heart Failure - EF 33% Endocrine Medical History: Positive for: Type II Diabetes Time with diabetes: 14 years Treated with: Insulin, Oral  agents Blood sugar tested every day: No Blood sugar testing results: Bedtime: 176 Genitourinary Medical History: Positive for: End Stage Renal Disease Integumentary (Skin) Medical History: Positive for: History of pressure wounds Neurologic Medical History: Positive for: Neuropathy - feel and legs HBO Extended History Items Ear/Nose/Mouth/Throat: Ear/Nose/Mouth/Throat: Chronic sinus problems/congestion Middle ear problems Immunizations Pneumococcal Vaccine: Received Pneumococcal Vaccination: No Santmyer, Tammee A. ([id number]) Implantable Devices No devices added Family and Social History Current every day smoker; Marital Status - Single; Alcohol Use: Never; Drug Use: Prior History; Caffeine Use: Daily Electronic Signature(s) Signed: 11/25/2021 5:04:16 PM By: Kalman Shan DO Signed: 11/29/2021 5:12:29 PM By: Levora Dredge Entered By: Kalman Shan on 11/25/2021 17:03:59 [name] ([id number]) -------------------------------------------------------------------------------- [place] Details Patient Name: Heather Dear A. Date of Service: 11/24/2021 Medical Record Number: [id number] Patient Account Number: [id number] Date of Birth/Sex: 1976-02-27 (46 y.o.  F) Treating RN: Levora Dredge Primary Care Provider: Raelene Bott Other Clinician: Referring Provider: Raelene Bott Treating Provider/Extender: Yaakov Guthrie in Treatment: 35 Diagnosis Coding ICD-10 Codes Code Description [phone] 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ity L97.512 Non-pressure chronic ulcer of other part of right foot with fat layer exposed L97.812 Non-pressure chronic ulcer of other part of right lower leg with fat layer exposed E11.621 Type 2 diabetes mellitus with foot ulcer E11.42 Type 2 diabetes mellitus with diabetic polyneuropathy L97.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 of skin Facility Procedures CPT4 Code: 15176160 Description: 11042 - DEB SUBQ TISSUE 20 SQ CM/< Modifier: Quantity: 1 CPT4 Code: Description: ICD-10 Diagnosis Description L97.512 Non-pressure chronic ulcer of other part of right foot with fat layer ex Modifier: posed Quantity: Physician Procedures CPT4 Code: 7371062 Description: 11042 - WC PHYS SUBQ TISS 20 SQ CM Modifier: Quantity: 1 CPT4 Code: Description: ICD-10 Diagnosis Description L97.512 Non-pressure chronic ulcer of other part of right foot with fat layer ex Modifier: posed Quantity: Electronic Signature(s) Signed: 11/25/2021 5:04:16 PM By: Kalman Shan DO Previous Signature: 11/24/2021 1:53:04 PM Version By: Levora Dredge Entered By: Kalman Shan on 11/25/2021 17:02:31

## 2021-12-01 ENCOUNTER — Other Ambulatory Visit: Payer: Self-pay

## 2021-12-01 ENCOUNTER — Encounter (HOSPITAL_BASED_OUTPATIENT_CLINIC_OR_DEPARTMENT_OTHER): Payer: Medicaid Other | Admitting: Internal Medicine

## 2021-12-01 DIAGNOSIS — E11621 Type 2 diabetes mellitus with foot ulcer: Secondary | ICD-10-CM | POA: Diagnosis not present

## 2021-12-01 DIAGNOSIS — L97512 Non-pressure chronic ulcer of other part of right foot with fat layer exposed: Secondary | ICD-10-CM | POA: Diagnosis not present

## 2021-12-01 DIAGNOSIS — L97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ity: Secondary | ICD-10-CM | POA: Diagnosis not present

## 2021-12-01 NOTE — Progress Notes (Signed)
AUTUMM, HATTERY ([id number]) Visit Report for 12/01/2021 Chief Complaint Document Details Patient Name: Heather, PEACHEY A. Date of Service: 12/01/2021 10:30 AM Medical Record Number: [id number] Patient Account Number: [id number] Date of Birth/Sex: Oct 09, 1976 (46 [name].o. F) Treating RN: Levora Dredge Primary Care Provider: Raelene Bott Other Clinician: Referring Provider: Raelene Bott Treating Provider/Extender: Yaakov Guthrie in Treatment: 7 Information Obtained from: Patient Chief Complaint 03/19/2021; patient referred by Dr. Luana Shu who has been looking after her left foot for quite a period of time for review of a nonhealing area in the left midfoot Electronic Signature(s) Signed: 12/01/2021 1:15:53 PM By: Kalman Shan DO Entered By: Kalman Shan on 12/01/2021 13:09:44 [name], [name] ([id number]) -------------------------------------------------------------------------------- Debridement Details Patient Name: Heather Dear A. Date of Service: 12/01/2021 10:30 AM Medical Record Number: [id number] Patient Account Number: [id number] Date of Birth/Sex: 1976-05-22 (18 [name].o. F) Treating RN: Levora Dredge Primary Care Provider: Raelene Bott Other Clinician: Referring Provider: Raelene Bott Treating Provider/Extender: Yaakov Guthrie in Treatment: 36 Debridement Performed for Wound #7 Left,Midline,Plantar Foot Assessment: Performed By: Physician Kalman Shan, MD Debridement Type: Debridement Severity of Tissue Pre Debridement: Fat layer exposed Level of Consciousness (Pre- Awake and Alert procedure): Pre-procedure Verification/Time Out Yes - 11:34 Taken: Pain Control: Lidocaine 4% Topical Solution Total Area Debrided (L x W): 4.9 (cm) x 1.5 (cm) = 7.35 (cm) Tissue and other material Viable, Non-Viable, Callus, Subcutaneous debrided: Level: Skin/Subcutaneous Tissue Debridement Description: Excisional Instrument: Curette, Other : silver nitrate for  hypergranulation Bleeding: Minimum Hemostasis Achieved: Pressure Response to Treatment: Procedure was tolerated well Level of Consciousness (Post- Awake and Alert procedure): Post Debridement Measurements of Total Wound Length: (cm) 4.9 Width: (cm) 1.5 Depth: (cm) 0.3 Volume: (cm) 1.732 Character of Wound/Ulcer Post Debridement: Stable Severity of Tissue Post Debridement: Fat layer exposed Post Procedure Diagnosis Same as Pre-procedure Electronic Signature(s) Signed: 12/01/2021 1:15:53 PM By: Kalman Shan DO Signed: 12/01/2021 4:45:40 PM By: Levora Dredge Entered By: Levora Dredge on 12/01/2021 11:37:11 [name], Heather ([id number]) -------------------------------------------------------------------------------- Debridement Details Patient Name: Heather Dear A. Date of Service: 12/01/2021 10:30 AM Medical Record Number: [id number] Patient Account Number: [id number] Date of Birth/Sex: 09/03/1976 (29 [name].o. F) Treating RN: Levora Dredge Primary Care Provider: Raelene Bott Other Clinician: Referring Provider: Raelene Bott Treating Provider/Extender: Yaakov Guthrie in Treatment: 36 Debridement Performed for Wound #9 Right Calcaneus Assessment: Performed By: Physician Kalman Shan, MD Debridement Type: Debridement Severity of Tissue Pre Debridement: Fat layer exposed Level of Consciousness (Pre- Awake and Alert procedure): Pre-procedure Verification/Time Out Yes - 11:36 Taken: Total Area Debrided (L x W): 0.5 (cm) x 0.8 (cm) = 0.4 (cm) Tissue and other material Viable, Non-Viable, Slough, Subcutaneous, Slough debrided: Level: Skin/Subcutaneous Tissue Debridement Description: Excisional Instrument: Curette Bleeding: Minimum Hemostasis Achieved: Pressure Response to Treatment: Procedure was tolerated well Level of Consciousness (Post- Awake and Alert procedure): Post Debridement Measurements of Total Wound Length: (cm) 0.5 Width: (cm)  0.8 Depth: (cm) 0.4 Volume: (cm) 0.126 Character of Wound/Ulcer Post Debridement: Stable Severity of Tissue Post Debridement: Fat layer exposed Post Procedure Diagnosis Same as Pre-procedure Electronic Signature(s) Signed: 12/01/2021 1:15:53 PM By: Kalman Shan DO Signed: 12/01/2021 4:45:40 PM By: Levora Dredge Entered By: Levora Dredge on 12/01/2021 11:40:05 [name], [name] ([id number]) -------------------------------------------------------------------------------- HPI Details Patient Name: Heather Dear A. Date of Service: 12/01/2021 10:30 AM Medical Record Number: [id number] Patient Account Number: [id number] Date of Birth/Sex: [birth year] (27 [name].o. F) Treating RN: Levora Dredge Primary Care Provider: Raelene Bott Other Clinician: Referring Provider: Raelene Bott Treating Provider/Extender: Yaakov Guthrie  in Treatment: 36 History of Present Illness HPI Description: 01/18/18-She is here for initial evaluation of the left great toe ulcer. She is a poor historian in regards to timeframe in detail. She states approximately 4 weeks ago she lacerated her toe on something in the house. She followed up with her primary care who placed her on Bactrim and ultimately a second dose of Bactrim prior to coming to wound clinic. She states she has been treating the toe with peroxide, Betadine and a Band-Aid. She did not check her blood sugar this morning but checked it yesterday morning it was 327; she is unaware of a recent A1c and there are no current records. She saw Dr. she would've orthopedics last week for an old injury to the left ankle, she states he did not see her toe, nor did she bring it to his attention. She smokes approximately 1 pack cigarettes a day. Her social situation is concerning, she arrives this morning with her mother who appears extremely intoxicated/under the influence; her mother was asked to leave the room and be monitored by the patient's grandmother. The  patient's aunt then accompanied the patient and the room throughout the rest of the appointment. We had a lengthy discussion regarding the deleterious effects of uncontrolled hyperglycemia and smoking as it relates to wound healing and overall health. She was strongly encouraged to decrease her smoking and get her diabetes under better control. She states she is currently on a diet and has cut down her [facility] consumption. The left toe is erythematous, macerated and slightly edematous with malodor present. The edema in her left foot is below her baseline, there is no erythema streaking. We will treat her with Santyl, doxycycline; we have ordered and xray, culture and provided a Peg assist surgical shoe and cultured the wound. 01/25/18-She is here in follow-up evaluation for a left great toe ulcer and presents with an abscess to her suprapubic area. She states her blood sugars remain elevated, feeling "sick" and if levels are below 250, but she is trying. She has made no attempt to decrease her smoking stating that we "can't take away her food in her cigarettes". She has been compliant with offloading using the PEG assist you. She is using Santyl daily. the culture obtained last week grew staph aureus and Enterococcus faecalis; continues on the doxycycline and Augmentin was added on Monday. The suprapubic area has erythema, no femoral variation, purple discoloration, minimal induration, was accessed with a cotton tip applicator with sanguinopurulent drainage, this was cultured, I suspect the current antibiotic treatment will cover and we will not add anything to her current treatment plan. She was advised to go to urgent care or ER with any change in redness, induration or fever. 02/01/18-She is here in follow-up evaluation for left great toe ulcers and a new abdominal abscess from last week. She was able to use packing until earlier this week, where she "forgot it was there". She states she was  feeling ill with GI symptoms last week and was not taking her antibiotic. She states her glucose levels have been predominantly less than 200, with occasional levels between 200-250. She thinks this was contributing to her GI symptoms as they have resolved without intervention. There continues to be significant laceration to left toe, otherwise it clinically looks stable/improved. There is now less superficial opening to the lateral aspect of the great toe that was residual blister. We will transition to [facility] to all wounds, she will continue her Augmentin.  If there is no change or deterioration next week for reculture. 02/08/18-She is here in follow-up evaluation for left great toe ulcer and abdominal ulcer. There is an improvement in both wounds. She has been wrapping her left toe with coban, not by our direction, which has created an area of discoloration to the medial aspect; she has been advised to NOT use coban secondary to her neuropathy. She states her glucose levels have been high over this last week ranging from 200-350, she continues to smoke. She admits to being less compliant with her offloading shoe. We will continue with same treatment plan and she will follow-up next week. 02/15/18-She is here in follow-up evaluation for left great toe ulcer and abdominal ulcer. The abdominal ulcer is epithelialized. The left great toe ulcer is improved and all injury from last week using the Coban wrap is resolved, the lateral ulcer is healed. She admits to noncompliance with wearing offloading shoe and admits to glucose levels being greater than 300 most of the week. She continues to smoke and expresses no desire to quit. There is one area medially that probes deeper than it has historically, erythema to the toe and dorsal foot has consistently waxed and waned. There is no overt signs of cellulitis or infection but we will culture the wound for any occult infection given the new area of depth  and erythema. We will hold off on sensitivities for initiation of antibiotic therapy. 02/22/18-She is here in follow up evaluation for left great toe ulcer. There is overall significant improvement in both wound appearance, erythema and edema with changes made last week. She was not initiated on antibiotic therapy. Culture obtained last week showed oxacillin sensitive staph aureus, sensitive to clindamycin. Clindamycin has been called into the pharmacy but she has been instructed to hold off on initiation secondary to overall clinical improvement and her history of antibiotic intolerance. She has been instructed to contact the clinic with any noted changes/deterioration and the wound, erythema, edema and/or pain. She will follow-up next week. She continues to smoke and her glucose levels remain elevated >250; she admits to compliance with offloading shoe 03/01/18 on evaluation today patient appears to be doing fairly well in regard to her left first toe ulcer. She has been tolerating the dressing changes with the [facility] Dressing without complication and overall this has definitely showed signs of improvement according to records as well is what the patient tells me today. I'm very pleased in that regard. She is having no pain today 03/08/18 She is here for follow up evaluation of a left great toe ulcer. She remains non-compliant with glucose control and smoking cessation; glucose levels consistently >200. She states that she got new shoe inserts/peg assist. She admits to compliance with offloading. Since my last evaluation there is significant improvement. We will switch to prisma at this time and she will follow up next week. She is noted to be tachycardic at this appointment, heart rate 120s; she has a history of heart rate 70-130 according to our records. She admits to extreme agitation r/t personal issues; she was advised to monitor her heartrate and contact her physician if it does not return  to a more normal range (<100). She takes cardizem twice daily. 03/15/18-She is here in follow-up evaluation for left great toe ulcer. She remains noncompliant with glucose control and smoking cessation. She admits to compliance with wearing offloading shoe. The ulcer is improved/stable and we will continue with the same treatment plan and she will  follow-up next week 03/22/18-She is here for evaluation for left great toe ulcer. There continues to be significant improvement despite recurrent hyperglycemia (over 500 yesterday) and she continues to smoke. She has been compliant with offloading and we will continue with same treatment plan and she will follow-up next week. 03/29/18-She is here for evaluation for left great toe ulcer. Despite continuing to smoke and uncontrolled diabetes she continues to improve. She is compliant with offloading shoe. We will continue with the same treatment plan and she will follow-up next week 04/05/18- She is here in follow up evaluation for a left great toe ulcer; she presents with small pustule to left fifth toe (resembles ant bite). She admits to compliance with wearing offloading shoe; continues to smoke or have uncontrolled blood glucose control. There is more callus than usual with evidence of bleeding; she denies known trauma. 04/12/18-She is here for evaluation of left great toe ulcer. Despite noncompliance with glycemic control and smoking she continues to make Stucke, Anyeli A. ([id number]) improvement. She continues to wear offloading shoe. The pustule, that was identified last week, to the left fifth toe is resolved. She will follow-up in 2 weeks 05/03/18-she is seen in follow-up evaluation for a left great toe ulcer. She is compliant with offloading, otherwise noncompliant with glycemic control and smoking. She has plateaued and there is minimal improvement noted. We will transition to [facility], replaced the insert to her surgical shoe and she will  follow-up in one week 05/10/18- She is here in follow up evaluation for a left great toe ulcer. It appears stable despite measurement change. We will continue with same treatment plan and follow up next week. 05/24/18-She is seen in follow-up evaluation for a left great toe ulcer. She remains compliant with offloading, has made significant improvement in her diet, decreasing the amount of sugar/soda. She said her recent A1c was 10.9 which is lower than. She did see a diabetic nutritionist/educator yesterday. She continues to smoke. We will continue with the same treatment plan and she'll follow-up next week. 05/31/18- She is seen in follow-up evaluation for left great toe ulcer. She continues to remain compliant with offloading, continues to make improvement in her diet, increasing her water and decreasing the amount of sugar/soda. She does continue to smoke with no desire to quit. We will apply Prisma to the depth and Hydrofera Blue over. We have not received insurance authorization for oasis. She will follow up next week. 06/07/18-She is seen in follow-up evaluation for left great toe ulcer. It has stalled according to today's measurements although base appears stable. She says she saw a diabetic educator yesterday; her average blood sugars are less than 300 which is an improvement for her. She continues to smoke and states "that's my next step" She continues with water over soda. We will order for xray, culture and reinstate ace wrap compression prior to placing apligraf for next week. She is voicing no complaints or concerns. Her dressing will change to iodoflex over the next week in preparation for apligraf. 06/14/18-She is seen in follow-up evaluation for left great toe ulcer. Plain film x-ray performed last week was negative for osteomyelitis. Wound culture obtained last week grew strep B and OSSA; she is initiated on keflex and cefdinir today; there is erythema to the toe which could be from ace  wrap compression, she has a history of wrapping too tight and has has been encouraged to maintain ace wraps that we place today. We will hold off on application of  apligraf today, will apply next week after antibiotic therapy has been initiated. She admits today that she has resumed taking a shower with her foot/toe submerged in water, she has been reminded to keep foot/toe out of the bath water. She will be seen in follow up next week 06/21/18-she is seen in follow-up evaluation for left great toe ulcer. She is tolerating antibiotic therapy with no GI disturbance. The wound is stable. Apligraf was applied today. She has been decreasing her smoking, only had 4 cigarettes yesterday and 1 today. She continues being more compliant in diabetic diet. She will follow-up next week for evaluation of site, if stable will remove at 2 weeks. 06/28/18- She is here in follow up evalution. Apligraf was placed last week, she states the dressing fell off on Tuesday and she was dressing with hydrofera blue. She is healed and will be discharged from the clinic today. She has been instructed to continue with smoking cessation, continue monitoring glucose levels, offloading for an additional 4 weeks and continue with hydrofera blue for additional two weeks for any possible microscopic opening. Readmission: 08/07/18 on evaluation today patient presents for reevaluation concerning the ulcer of her right great toe. She was previously discharged on 06/28/18 healed. Nonetheless she states that this began to show signs of drainage she subsequently went to her primary care provider. Subsequently an x-ray was performed on 08/01/18 which was negative. The patient was also placed on antibiotics at that time. Fortunately they should have been effective for the infection. Nonetheless she's been experiencing some improvement but still has a lot of drainage coming from the wound itself. 08/14/18 on evaluation today patient's wound  actually does show signs of improvement in regard to the erythema at this point. She has completed the antibiotics. With that being said we did discuss the possibility of placing her in a total contact cast as of today although I think that I may want to give this just a little bit more time to ensure nothing recurrence as far as her infection is concerned. I do not want to put in the cast and risk infection at that time if things are not completely resolved. With that being said she is gonna require some debridement today. 08/21/18 on evaluation today patient actually appears to be doing okay in regard to her toe ulcer. She's been tolerating the dressing changes without complication. With that being said it does appear that she is ready and in fact I think it's appropriate for [place] to go ahead and initiate the total contact cast today. Nonetheless she will require some sharp debridement to prepare the wound for application. Overall I feel like things have been progressing well but we do need to do something to get this to close more readily. 08/24/18 patient seen today for reevaluation after having had the total contact cast applied on Tuesday. She seems to have done very well the wound appears to be doing great and overall I'm pleased with the progress that she's made. There were no abnormal areas of rubbing from the cast on her lower extremity. 08/30/18 on evaluation today patient actually appears to be completely healed in regard to her plantar toe ulcer. She tells me at this point she's been having a lot of issues with the cast. She almost fell a couple of times the state shall the step of her dog a couple times as well. This is been a very frustrating process for her other nonetheless she has completely healed the wound which is  excellent news. Overall there does not appear to be the evidence of infection at this time which is great news. 09/11/18 evaluation today patient presents for follow-up  concerning her great toe ulcer on the left which has unfortunately reopened since I last saw her which was only a couple of weeks ago. Unfortunately she was not able to get in to get the shoe and potentially the AFO that's gonna be necessary due to her left foot drop. She continues with offloading shoe but this is not enough to prevent her from reopening it appears. When we last had her in the total contact cast she did well from a healing standpoint but unfortunately the wound reopened as soon as she came out of the cast within just a couple of weeks. 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 This is also part of the reason why she falls on a regular basis. I just do not believe that is gonna be safe for [place] to reinitiate the total contact cast as last time we had this on she fell 3 times one week which is definitely not normal for her. 09/18/18 upon evaluation today the patient actually appears to be doing about the same in regard to her toe ulcer. She did not contact [employer] as I asked her to even though I had given her the prescription. In fact she actually states that she has no idea where the prescription is. She did apparently call [employer] and they told her that all she needed to do was bring the prescription in order to be able to be seen and work on getting the AFO for her left foot. With all that being said she still does not have an appointment and I'm not sure were things stand that regard. I will give her a new prescription today in order to contact them to get this set up. 09/25/18 on evaluation today patient actually appears to be doing about the same in regard to her toes ulcer. She does have a small areas which seems to have a lot of callous buildup around the edge of the wound which is  going to need sharp debridement today. She still is waiting to be scheduled for evaluation with [employer] for possibility of an AFO. She states there supposed to call her tomorrow to get this set up. Unfortunately it does appear that her foot specifically the toe area is showing signs of erythema. There does not appear to be any systemic infection which is in these good news. SHAQUISHA, WYNN A. ([id number]) 10/02/18 on evaluation today patient actually appears to be doing about the same in regard to her toe ulcer. This really has not done too well although it's not significantly larger it's also not significantly smaller. She has been tolerating the dressing changes without complication. She actually has her appointment with [facility] and [place] tomorrow to hopefully be measured for obtaining and AFO splint. I think this would be helpful preventing this from reoccurring. We had contemplated starting the cast this week although to be honest I am reluctant to do that as she's been having nausea, vomiting, and seizure activity over the past three days. She has a history of seizures and have been told is nothing that can be done for these. With that being said  I do believe that along with the seizures have the nausea vomiting which upon further questioning doesn't seem to be the normal for her and makes me concerned for the possibility of infection or something else going on. I discussed this with the patient and her mother during the office visit today. I do not feel the wound is effective but maybe something else. The responses this was "this just happens to her at times and we don't know why". They did not seem to be interested in going to the hospital to have this checked out further. 10/09/18 on evaluation today patient presents for follow-up concerning her ongoing toe ulcer. She has been tolerating the dressing changes without complication. Fortunately there does not appear to be any evidence of  infection which is great news however I do think that the patient would benefit from going ahead for with the total contact cast. She's actually in a wheelchair today she tells me that she will use her walker if we initiate the cast. I was very specific about the fact that if we were gonna do the cast I wanted to make sure that she was using the walker in order to prevent any falls. She tells me she does not have stairs that she has to traverse on a regular basis at her home. She has not had any seizures since last week again that something that happens to her often she tells me she did talk to [employer] and they said that it may take up to three weeks to get the brace approved for her. Hopefully that will not take that long but nonetheless in the meantime I do think the cast could be of benefit. 10/12/18 on evaluation today patient appears to be doing rather well in regard to her toe ulcer. It's just been a few days and already this is significantly improved both as far as overall appearance and size. Fortunately there's no sign of infection. She is here for her first obligatory cast change. 10/19/18 Seen today for follow up and management of left great toe ulcer. Wound continues to show improvement. Noted small open area with seroussang drainage with palpation. Denies any increased pain or recent fevers during visit. She will continue calcium alginate with offloading shoe. Denies any questions or concerns during visit. 10/26/18 on evaluation today patient appears to be doing about the same as when I last saw her in regard to her wound bed. Fortunately there does not appear to be any signs of infection. Unfortunately she continues to have a breakdown in regard to the toe region any time that she is not in the cast. It takes almost no time at all for this to happen. Nonetheless she still has not heard anything from the brace being made by [employer] as to when exactly this will be available to her.  Fortunately there is no signs of infection at this time. 10/30/18 on evaluation today patient presents for application of the total contact cast as we just received him this morning. Fortunately we are gonna be able to apply this to her today which is great news. She continues to have no significant pain which is good news. Overall I do feel like things have been improving while she was the cast is when she doesn't have a cast that things get worse. She still has not really heard anything from [facility] regarding her brace. 11/02/18 upon evaluation today patient's wound already appears to be doing significantly better which is good news. Fortunately there does  not appear to be any signs of infection also good news. Overall I do think the total contact cast as before is helping to heal this area unfortunately it's just not gonna likely keep the area closed and healed without her getting her brace at least. Again the foot drop is a significant issue for her. 11/09/18 on evaluation today patient appears to be doing excellent in regard to her toe ulcer which in fact is completely healed. Fortunately we finally got the situation squared away with the paperwork which was needed to proceed with getting her brace approved by Medicaid. I have filled that out unfortunately that information has been sent to the orthopedic office that I worked at 2 1/2 years ago and not tired Current wound care measures. Fortunately she seems to be doing very well at this time. 11/23/18 on evaluation today patient appears to be doing More Poorly Compared to Last Time I Saw Her. At [facility] She Had Completely Healed. Currently she is continuing to have issues with reopening. She states that she just found out that the brace was approved through Medicaid now she just has to go get measured in order to have this fitted for her and then made. Subsequently she does not have an appointment for this yet that is going to complicate things we  obviously cannot put her back in the cast if we do not have everything measured because they're not gonna be able to measure her foot while she is in the cast. 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 Obviously this is unfortunate and does have me somewhat worried as well. 11/30/18 on evaluation today patient's toe ulcer actually appears to be doing fairly well. The good news is she will be getting her brace in the shoes next week on Wednesday. Hopefully we will be able to get this to heal without having to go back in the cast however she may need the cast in order to get the wound completely heal and then go from there. Fortunately there's no signs of infection at this time. 12/07/18 on evaluation today patient fortunately did receive her brace and she states she could tell this definitely makes her walk better. With that being said she's been having issues with her toe where she noticed yesterday there was a lot of tissue that was loosing off this appears to be much larger than what it was previous. She also states that her leg has been read putting much across the top of her foot just about the ankle although this seems to be receiving somewhat. The total area is still red and appears to be someone infected as best I can tell. She is previously taken Bactrim and that may be a good option for her today as well. We are gonna see what I wound culture shows as well and I think that this is definitely appropriate. With that being said outside of the culture I still need to initiate something in the interim and that's what I'm gonna go ahead and select Bactrim is a good option for her. 12/14/18 on evaluation today patient appears to be doing better in regard to her left great toe ulcer as compared to last week's evaluation. There's still some erythema although this is significantly improved which is excellent  news. Overall I do believe that she is making good progress is still gonna take some time  before she is where I would like her to be from the standpoint of being able to place her back into the total contact cast. Hopefully we will be where we need to be by next week. 12/21/18 on evaluation today patient actually appears to be doing poorly in regard to her toe ulcer. She's been tolerating the dressing changes without complication. Fortunately there's no signs of systemic infection although she does have a lot of drainage from the toe ulcer and this does seem to be causing some issues at this point. She does have erythema on the distal portion of her toe that appears to be likely cellulitis. 12/28/18 on evaluation today patient actually appears to be doing a little better in my pinion in regard to her toe ulcer. With that being said she still does have some evidence of infection at this time and for her culture she had both E. coli as well as enterococcus as organisms noted on evaluation. For that reason I think that though the Keflex likely has treated the E. coli rather well this has really done nothing for the enterococcus. We are going to have to initiate treatment for this specifically. [name], CALLOWAY A. ([id number]) 01/04/19 on evaluation today patient's toe actually appears to be doing better from the standpoint of infection. She currently would like to see about putting the cash back on I think that this is appropriate as long as she takes care of it and keeps it from getting wet. She is gonna have some drainage we can definitely pass this up with Drawtex and alginate to try to prevent as much drainage as possible from causing the problems. With that being said I do want to at least try her with the cast between now and Tuesday. If there any issues we can't continue to use it then I will discontinue the use of the cast at that point. 01/08/19 on evaluation today patient actually appears to be doing  very well as far as her foot ulcer specifically the great toe on the left is concerned. She did have an area of rubbing on the medial aspect of her left ankle which again is from the cast. Fortunately there's no signs of infection at this point in this appears to be a very slight skin breakdown. The patient tells me she felt it rubbing but didn't think it was that bad. Fortunately there is no signs of active infection at this time which is good news. No fevers, chills, nausea, or vomiting noted at this time. 01/15/19 on evaluation today patient actually appears to be doing well in regard to her toe ulcer. Again as previous she seems to do well and she has the cast on which indicates to me that during the time she doesn't have a cast on she's putting way too much pressure on this region. Obviously I think that's gonna be an issue as with the current national emergency concerning the Covid-19 Virus it has been recommended that we discontinue the use of total contact casting by the chief medical officer of our company, Dr. Simona Huh. The reasoning is that if a patient becomes sick and cannot come into have the cast removed they could not just leave this on for an additional two weeks. Obviously the hospitals also do not want to receive patient's who are sick into the emergency department to potentially contaminate the region and spread the Covid-19 Virus among other sick individuals within the hospital system. Therefore at this point we are suspending the use  of total contact cast until the current emergency subsides. This was all discussed with the patient today as well. 01/22/19 on evaluation today patient's wound on her left great toe appears to be doing slightly worse than previously noted last week. She tells me that she has been on this quite a bit in fact she tells me she's been awake for 38 straight hours. This is due to the fact that she's having to care for grandparents because nobody else will. She  has been taking care of them for five the last seven days since I've seen her they both have dementia his is from a stroke and her grandmother's was progressive. Nonetheless she states even her mom who knows her condition and situation has only help two of those days to take care of them she's been taking care of the rest. Fortunately there does not appear to be any signs of active infection in regard to her toe at this point although obviously it doesn't look as good as it did previous. I think this is directly related to her not taking off the pressure and friction by way of taking things easy. Though I completely understand what's going on. 01/29/19 on evaluation today patient's tools are actually appears to be showing some signs of improvement today compared to last week's evaluation as far as not necessarily the overall size of the wound but the fact that she has some new skin growth in between the two ends of the wound opening. Overall I feel like she has done well she states that she had a family member give her what sounds to be a CAM walker boot which has been helpful as well. 02/05/19 on evaluation today patient's wound bed actually appears to be doing significantly better in regard to her overall appearance of the size of the wound. With that being said she is still having an issue with offloading efficiently enough to get this to close. Apparently there is some signs of infection at this point as well unfortunately. Previously she's done well of Augmentin I really do not see anything that needs to be culture currently but there theme and cellulitis of the foot that I'm seeing I'm gonna go ahead and place her on an antibiotic today to try to help clear this up. 02/12/2019 on evaluation today patient actually appears to be doing poorly in regard to her overall wound status. She tells me she has been using her offloading shoe but actually comes in today wearing her tennis shoe with the AFO brace.  Again as I previously discussed with her this is really not sufficient to allow the area to heal appropriately. Nonetheless she continues to be somewhat noncompliant and I do wonder based on what she has told my nurse in the past as to whether or not a good portion of this noncompliance may be recreational drug and alcohol related. She has had a history of heroin overdose and this was fairly recently in the past couple of months that have been seeing her. Nonetheless overall I feel like her wound looks significantly worse today compared to what it was previous. She still has significant erythema despite the Augmentin I am not sure that this is an appropriate medication for her infection I am also concerned that the infection may have gone down into her bone. 02/19/19 on evaluation today patient actually appears to be doing about the same in regard to her toe ulcer. Unfortunately she continues to show signs of bone exposure and infection  at this point. There does not appear to be any evidence of worsening of the infection but I'm also not really sure that it's getting significantly better. She is on the Augmentin which should be sufficient for the Staphylococcus aureus infection that she has at this point. With that being said she may need IV antibiotics to more appropriately treat this. We did have a discussion today about hyperbaric option therapy. 02/28/19 on evaluation today patient actually appears to be doing much worse in regard to the wound on her left great toe as compared to even my previous evaluation last week. Unfortunately this seems to be training in a pretty poor direction. Her toe was actually now starting to Heather laterally and I can actually see the entire joint area of the proximal portion of the digit where is the distal portion of the digit again is no longer even in contact with the joint line. Unfortunately there's a lot more necrotic tissue around the edge and the toe appears to be  showing signs of becoming gangrenous in my pinion. I'm very concerned about were things stand at this point. She did see infectious disease and they are planning to send in a prescription for Sivextro for her and apparently this has been approved. With that being said I don't think she should avoid taking this but at the same time I'm not sure that it's gonna be sufficient to save her toe at this point. 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 03/05/19 on evaluation today patient actually appears to be doing quite well in regard to her toe all things considering. She still has bone exposed but there appears to be much less your thing on overall the appearance of the wound and the toe itself is dramatically improved. She still does have some issues currently obviously with infection she did see vascular as well and there concerned that her blood flow to the toad. For that reason they are setting up for an angiogram next week. 03/14/19 on evaluation today patient appears to be doing very poor in regard to her toe and specifically in regard to the ulceration and the fact that she's starting to notice the toe was leaning even more towards the lateral aspect and the complete joint is visible on the proximal aspect of the joint. Nonetheless she's also noted a significant odor and the tip of the toe is turning more dark and necrotic appearing. Overall I think she is getting worse not better as far as this is concerned. For that reason I am recommending at this point that she likely needs to be seen for likely amputation. READMISSION 03/19/2021 This is a patient that we cared for in this clinic for a prolonged period of time in 2019 and 2020 with a left foot and left first toe wound. I believe she  ultimately became infected and underwent a left first toe amputation. 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 ([id number]) 04/09/20 by Dr. Luana Shu. In December 2021 she had an ulcer on her right great toe as well as the fourth and fifth toes. 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 She also had an angiogram at that time and underwent angioplasty of the  right anterior tibial artery. In any case she claims that the wound on the right foot is closed I did not look at this today which was probably an oversight although I think that should be done next week. After her surgery she developed a dehiscence but I do not see any follow-up of this. According to Dr. Deborra Medina last review that she was out of the area being cared for by another physician but recently came back to his attention. 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 A culture of this area showed E. coli apparently before she came back to see Dr. Luana Shu she was supposed to be receiving antibiotics but she did not really take them. Nor is she offloading this area at all. Finally her last hemoglobin A1c listed in epic was in March 2022 at 14.1 she says things are a lot better since then although I am not sure. 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 She had this wound at the time. During this admission she had E. coli sepsis a TEE was negative. 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 She is still a heavy cigarette smoker. She has a history of bipolar disorder seizure disorder ABI in our clinic was 1.05 6/1; left midfoot  in the setting of a TMA done previously. Round circular wound with a "knuckle" of protruding tissue. The problem is that the knuckle was not attached to any of the surrounding granulation and this probed proximally widely I removed a large portion of this tissue. This wound goes with considerable undermining laterally. I do not feel any bone there was no purulence but this is a deep wound. 6/8; in spite of the debridement I did last week. She arrives with a wound looking exactly the same. A protruding "knuckle" of tissue nonadherent to most of the surrounding tissue. There is considerable depth around this from 6-12 o'clock at 2.7 cm and undermining of 1 cm. This does not look overtly infected and the x-ray I did last week was negative for any osseous abnormalities. We have been using silver collagen 6/15; deep tissue culture I did last week showed moderate staph aureus and moderate Pseudomonas. This will definitely require prolonged antibiotic therapy. The pathology on the protuberant area was negative for malignancy fungus etc. 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 We have been using silver collagen. I am going to be prescribing Levaquin for 2 weeks[address] Her CT scan of the foot is down for 7/5 6/22; CT scan of the foot on 7 5. She says she has hardware in the left leg from her previous fracture. She is on the Levaquin for the deep tissue culture I did that showed methicillin sensitive staph aureus and Pseudomonas. I gave her a 2-week supply and she will have another week. She arrives in clinic today with the same protuberant tissue however this is nonadherent to the tissue surrounding it. I am really at a loss to explain this unless there is underlying deep tissue infection 6/29; patient presents for 1 week follow-up. She has been using collagen to the wound bed. She reports taking her antibiotics as prescribed.She has no complaints or  issues today. She denies  signs of infection. 7/6; patient presents for one week followup. She has been using collagen to the wound bed. She states she is taking Levaquin however at times she is not able to keep it down. She denies signs of infection. 7/13; patient presents for 1 week follow-up. She has been using silver alginate to the wound bed. She still has nausea when taking her antibiotics. She denies signs of infection. 7/20; patient presents for 1 week follow-up. She has been using silver alginate with gentamicin cream to the wound bed. She denies any issues and has no complaints today. She denies signs of infection. 7/27; patient presents for 1 week follow-up. She continues to use silver alginate with gentamicin cream to the wound bed. She reports starting her antibiotics. She has no issues or complaints. Overall she reports stability to the wound. 8/3; patient presents for 1 week follow-up. She has been using silver alginate with gentamicin cream to the wound bed. She reports completing all antibiotics. She has no issues or complaints today. She denies signs of infection. 8/17; patient presents for 2-week follow-up. He is to use silver alginate to the wound bed. She has no issues or complaints today. She denies signs of infection. She reports her pain has improved in her foot since last clinic visit 8/24; patient presents for 1 week follow-up. She continues to use silver alginate to the wound bed. She has no issues or complaints. She denies signs of infection. Pain is stable. 9/7; patient presents for follow-up. She missed her last week appointment due to feeling ill. She continues to use silver alginate. She has a new wound to the right lower extremity that is covered in eschar. She states It occurred over the past week and has no idea how it started. She currently denies signs of infection. 9/14; patient presents for follow-up. To the left foot wound she has been using gentamicin cream  and silver alginate. To the right lower extremity wound she has been keeping this covered and has not obtain Santyl. 9/21; patient presents for follow-up. She reports using gentamicin cream and silver alginate to the left foot and Santyl to the right lower extremity wound. She has no issues or complaints today. She denies signs of infection. 9/28; patient presents for follow-up. She reports a new wound to her right heel. She states this occurred a few days ago and is progressively gotten worse. She has been trying to clean the area with a Q-tip and Santyl. She reports stability in the other 2 wounds. She has been using gentamicin cream and silver alginate to the left foot and Santyl to the right lower extremity wound. 10/12; patient presents for follow-up. She reports improvement to the wound beds. She is seeing vein and vascular to discuss the potential of a left BKA. She states they are going to do an arteriogram. She continues to use silver alginate with dressing changes to her wounds. 11/2; patient presents for follow-up. She states she has not been doing dressing changes to the wound beds. She states she is not able to offload the areas. She reports chronic pain to her left foot wound. 11/9; patient presents for follow-up. She came in with only socks on. She states she forgot to put on shoes. It is unclear if she is doing any dressing changes. She currently denies systemic signs of infection. 11/16; patient presents for follow-up. She came again only with socks on. She states she does not wear shoes ever. It is unclear if she does dressing  changes. She currently denies systemic signs of infection. 11/23; patient presents for follow-up. She wore her shoes today. It still unclear exactly what dressing she is using for each wound but she did states she obtained Dakin's solution and has been using this to the left foot wound. She currently denies signs of infection. 11/30; patient presents for  follow-up. She has no issues or complaints today. She currently denies signs of infection. 12/7; patient presents for follow-up. She has no issues or complaints today. She has been using Hydrofera Blue to the right heel wound and Dakin solution to the left foot wound. Her right anterior leg wound is healed. She currently denies signs of infection. ISZABELLA, HEBENSTREIT A. ([id number]) 12/14; patient presents for follow-up. She has been using Hydrofera Blue to the right heel and Dakin's to the left foot wounds. She has no issues or complaints today. She denies signs of infection. 12/21; patient presents for follow-up. She reports using Hydrofera Blue to the right heel and Dakin's to the left foot wound. She denies signs of infection. 12/28; patient presents for follow-up. She continues to use Dakin's to the left foot wound and Hydrofera Blue to the right heel wound. She denies signs of infection. 1/4; patient presents for follow-up. She has no issues or complaints today. She denies signs of infection. 1/11; patient presents for follow-up. It is unclear if she has been dressing these wounds over the past week. She currently denies signs of infection. 1/18; patient presents for follow-up. She states she has been using Dakin's wet-to-dry dressings to the left foot. She has been using Hydrofera Blue to the right foot foot wound. She states that the anterior right leg wound has reopened and draining serous fluid. She denies signs of infection. 1/25; patient presents for follow-up. She has no issues or complaints today. 2/1; patient presents for follow-up. She has no issues or complaints today. She denies signs of infection. 2/8; patient presents for follow-up. She has lost her surgical shoes. She did not have a dressing to the right heel wound. She currently denies signs of infection. Electronic Signature(s) Signed: 12/01/2021 1:15:53 PM By: Kalman Shan DO Entered By: Kalman Shan on 12/01/2021  13:10:27 [name] ([id number]) -------------------------------------------------------------------------------- Physical Exam Details Patient Name: Heather Dear A. Date of Service: 12/01/2021 10:30 AM Medical Record Number: [id number] Patient Account Number: [id number] Date of Birth/Sex: 1975/12/10 (25 [name].o. F) Treating RN: Levora Dredge Primary Care Provider: Raelene Bott Other Clinician: Referring Provider: Raelene Bott Treating Provider/Extender: Yaakov Guthrie in Treatment: 47 Constitutional . Cardiovascular . Psychiatric . Notes Left foot (Transmetatarsal amputation): Wound to the plantar aspect that has granulation tissue and scant nonviable tissue And circumferential Callus. There is hyper granulated tissue present as well. Right foot: To the heel there is an open wound with Granulation tissue , And nonviable tissue with Circumferential callus. No obvious signs of infection to any wound. Right lower extremity: To the anterior aspect there is A pinpoint open wound. Electronic Signature(s) Signed: 12/01/2021 1:15:53 PM By: Kalman Shan DO Entered By: Kalman Shan on 12/01/2021 13:12:26 [name] ([id number]) -------------------------------------------------------------------------------- Physician Orders Details Patient Name: Heather Dear A. Date of Service: 12/01/2021 10:30 AM Medical Record Number: [id number] Patient Account Number: [id number] Date of Birth/Sex: 03-12-1976 (6 [name].o. F) Treating RN: Levora Dredge Primary Care Provider: Raelene Bott Other Clinician: Referring Provider: Raelene Bott Treating Provider/Extender: Yaakov Guthrie in Treatment: 85 Verbal / Phone Orders: No Diagnosis Coding Follow-up Appointments o Return Appointment in 1 week. Bathing/ Engineer, mining  o Wash wounds with antibacterial soap and water. Off-Loading Left Lower Extremity o Open toe surgical shoe with peg assist. o Turn and  reposition every 2 hours o Other: - peg shoe right, off-loader left Medications-Please add to medication list. o P.O. Antibiotics o Other: - per MD [place] ok to leave right lower leg dressing on for 3-4 days and then continue daily Wound Treatment Wound #10 - Lower Leg Wound Laterality: Right, Midline Primary Dressing: Zetuvit Plus Silicone Border Dressing 4x4 (in/in) 1 x Per Day/30 Days Wound #7 - Foot Wound Laterality: Plantar, Left, Midline Cleanser: Dakin 16 (oz) 0.25 1 x Per Day/30 Days Discharge Instructions: Use as directed. Primary Dressing: Gauze 1 x Per Day/30 Days Discharge Instructions: As directed: moistened with Dakins Solution Secondary Dressing: Kerlix 4.5 x 4.1 (in/yd) 1 x Per Day/30 Days Discharge Instructions: Apply Kerlix 4.5 x 4.1 (in/yd) as instructed Secured With: Kerlix Roll Sterile or Non-Sterile 6-ply 4.5x4 (yd/yd) 1 x Per Day/30 Days Discharge Instructions: Apply Kerlix as directed Wound #9 - Calcaneus Wound Laterality: Right Cleanser: Soap and Water 1 x Per Day/30 Days Discharge Instructions: Gently cleanse wound with antibacterial soap, rinse and pat dry prior to dressing wounds Primary Dressing: Hydrofera Blue Ready Transfer Foam, 2.5x2.5 (in/in) 1 x Per Day/30 Days Discharge Instructions: Apply Hydrofera Blue Ready to wound bed as directed Secondary Dressing: Zetuvit Plus Silicone Border Dressing 4x4 (in/in) 1 x Per Day/30 Days Secured With: coverlet 1 x Per Day/30 Days Electronic Signature(s) Signed: 12/01/2021 1:15:53 PM By: Kalman Shan DO Entered By: Kalman Shan on 12/01/2021 13:15:19 [name], [name] ([id number]) -------------------------------------------------------------------------------- Problem List Details Patient Name: Heather Dear A. Date of Service: 12/01/2021 10:30 AM Medical Record Number: [id number] Patient Account Number: [id number] Date of Birth/Sex: [birth year] (69 [name].o. 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 03/19/2021 No Yes severity L97.512 Non-pressure chronic ulcer of other part of right foot with fat layer 09/15/2021 No Yes exposed L97.812 Non-pressure chronic ulcer of other part of right lower leg with fat layer 09/15/2021 No Yes exposed E11.621 Type 2 diabetes mellitus with foot ulcer 03/19/2021 No Yes E11.42 Type 2 diabetes mellitus with diabetic polyneuropathy 03/19/2021 No Yes L97.811 Non-pressure chronic ulcer of other part of right lower leg limited to 11/10/2021 No Yes breakdown of skin Inactive Problems ICD-10 Code Description Active Date Inactive Date S81.801A Unspecified open wound, right lower leg, initial encounter 06/30/2021 06/30/2021 Resolved Problems Electronic Signature(s) Signed: 12/01/2021 1:15:53 PM By: Kalman Shan DO Entered By: Kalman Shan on 12/01/2021 13:09:36 [name], [name] ([id number]) -------------------------------------------------------------------------------- Progress Note Details Patient Name: Heather Dear A. Date of Service: 12/01/2021 10:30 AM Medical Record Number: [id number] Patient Account Number: [id number] Date of Birth/Sex: [birth year] (75 [name].o. F) Treating RN: Levora Dredge Primary Care Provider: Raelene Bott Other Clinician: Referring Provider: Raelene Bott Treating Provider/Extender: Yaakov Guthrie in Treatment: 48 Subjective Chief Complaint Information obtained from Patient 03/19/2021; patient referred by Dr. Luana Shu who has been looking after her left foot for quite a period of time for review of a nonhealing area in the left midfoot History of Present Illness (HPI) 01/18/18-She is here for initial evaluation of the  left great toe ulcer. She is a poor historian in regards to timeframe in detail. She states approximately 4 weeks ago she lacerated  her toe on something in the house. She followed up with her primary care who placed her on Bactrim and ultimately a second dose of Bactrim prior to coming to wound clinic. She states she has been treating the toe with peroxide, Betadine and a Band-Aid. She did not check her blood sugar this morning but checked it yesterday morning it was 327; she is unaware of a recent A1c and there are no current records. She saw Dr. she would've orthopedics last week for an old injury to the left ankle, she states he did not see her toe, nor did she bring it to his attention. She smokes approximately 1 pack cigarettes a day. Her social situation is concerning, she arrives this morning with her mother who appears extremely intoxicated/under the influence; her mother was asked to leave the room and be monitored by the patient's grandmother. The patient's aunt then accompanied the patient and the room throughout the rest of the appointment. We had a lengthy discussion regarding the deleterious effects of uncontrolled hyperglycemia and smoking as it relates to wound healing and overall health. She was strongly encouraged to decrease her smoking and get her diabetes under better control. She states she is currently on a diet and has cut down her [facility] consumption. The left toe is erythematous, macerated and slightly edematous with malodor present. The edema in her left foot is below her baseline, there is no erythema streaking. We will treat her with Santyl, doxycycline; we have ordered and xray, culture and provided a Peg assist surgical shoe and cultured the wound. 01/25/18-She is here in follow-up evaluation for a left great toe ulcer and presents with an abscess to her suprapubic area. She states her blood sugars remain elevated, feeling "sick" and if levels are below 250, but she is  trying. She has made no attempt to decrease her smoking stating that we "can't take away her food in her cigarettes". She has been compliant with offloading using the PEG assist you. She is using Santyl daily. the culture obtained last week grew staph aureus and Enterococcus faecalis; continues on the doxycycline and Augmentin was added on Monday. The suprapubic area has erythema, no femoral variation, purple discoloration, minimal induration, was accessed with a cotton tip applicator with sanguinopurulent drainage, this was cultured, I suspect the current antibiotic treatment will cover and we will not add anything to her current treatment plan. She was advised to go to urgent care or ER with any change in redness, induration or fever. 02/01/18-She is here in follow-up evaluation for left great toe ulcers and a new abdominal abscess from last week. She was able to use packing until earlier this week, where she "forgot it was there". She states she was feeling ill with GI symptoms last week and was not taking her antibiotic. She states her glucose levels have been predominantly less than 200, with occasional levels between 200-250. She thinks this was contributing to her GI symptoms as they have resolved without intervention. There continues to be significant laceration to left toe, otherwise it clinically looks stable/improved. There is now less superficial opening to the lateral aspect of the great toe that was residual blister. We will transition to [facility] to all wounds, she will continue her Augmentin. If there is no change or deterioration next week for reculture. 02/08/18-She is here in follow-up evaluation for left great toe ulcer and abdominal ulcer. There is an improvement in both wounds. She has been wrapping her left toe with coban,  not by our direction, which has created an area of discoloration to the medial aspect; she has been advised to NOT use coban secondary to her neuropathy.  She states her glucose levels have been high over this last week ranging from 200-350, she continues to smoke. She admits to being less compliant with her offloading shoe. We will continue with same treatment plan and she will follow-up next week. 02/15/18-She is here in follow-up evaluation for left great toe ulcer and abdominal ulcer. The abdominal ulcer is epithelialized. The left great toe ulcer is improved and all injury from last week using the Coban wrap is resolved, the lateral ulcer is healed. She admits to noncompliance with wearing offloading shoe and admits to glucose levels being greater than 300 most of the week. She continues to smoke and expresses no desire to quit. There is one area medially that probes deeper than it has historically, erythema to the toe and dorsal foot has consistently waxed and waned. There is no overt signs of cellulitis or infection but we will culture the wound for any occult infection given the new area of depth and erythema. We will hold off on sensitivities for initiation of antibiotic therapy. 02/22/18-She is here in follow up evaluation for left great toe ulcer. There is overall significant improvement in both wound appearance, erythema and edema with changes made last week. She was not initiated on antibiotic therapy. Culture obtained last week showed oxacillin sensitive staph aureus, sensitive to clindamycin. Clindamycin has been called into the pharmacy but she has been instructed to hold off on initiation secondary to overall clinical improvement and her history of antibiotic intolerance. She has been instructed to contact the clinic with any noted changes/deterioration and the wound, erythema, edema and/or pain. She will follow-up next week. She continues to smoke and her glucose levels remain elevated >250; she admits to compliance with offloading shoe 03/01/18 on evaluation today patient appears to be doing fairly well in regard to her left first toe  ulcer. She has been tolerating the dressing changes with the [facility] Dressing without complication and overall this has definitely showed signs of improvement according to records as well is what the patient tells me today. I'm very pleased in that regard. She is having no pain today 03/08/18 She is here for follow up evaluation of a left great toe ulcer. She remains non-compliant with glucose control and smoking cessation; glucose levels consistently >200. She states that she got new shoe inserts/peg assist. She admits to compliance with offloading. Since my last evaluation there is significant improvement. We will switch to prisma at this time and she will follow up next week. She is noted to be tachycardic at this appointment, heart rate 120s; she has a history of heart rate 70-130 according to our records. She admits to extreme agitation r/t personal issues; she was advised to monitor her heartrate and contact her physician if it does not return to a more normal range (<100). She takes cardizem twice daily. 03/15/18-She is here in follow-up evaluation for left great toe ulcer. She remains noncompliant with glucose control and smoking cessation. She admits to compliance with wearing offloading shoe. The ulcer is improved/stable and we will continue with the same treatment plan and she will follow-up next week 03/22/18-She is here for evaluation for left great toe ulcer. There continues to be significant improvement despite recurrent hyperglycemia (over 500 yesterday) and she continues to smoke. She has been compliant with offloading and we will continue with  same treatment plan and she will follow-up next week. [name], [name] ([id number]) 03/29/18-She is here for evaluation for left great toe ulcer. Despite continuing to smoke and uncontrolled diabetes she continues to improve. She is compliant with offloading shoe. We will continue with the same treatment plan and she will follow-up next  week 04/05/18- She is here in follow up evaluation for a left great toe ulcer; she presents with small pustule to left fifth toe (resembles ant bite). She admits to compliance with wearing offloading shoe; continues to smoke or have uncontrolled blood glucose control. There is more callus than usual with evidence of bleeding; she denies known trauma. 04/12/18-She is here for evaluation of left great toe ulcer. Despite noncompliance with glycemic control and smoking she continues to make improvement. She continues to wear offloading shoe. The pustule, that was identified last week, to the left fifth toe is resolved. She will follow-up in 2 weeks 05/03/18-she is seen in follow-up evaluation for a left great toe ulcer. She is compliant with offloading, otherwise noncompliant with glycemic control and smoking. She has plateaued and there is minimal improvement noted. We will transition to [facility], replaced the insert to her surgical shoe and she will follow-up in one week 05/10/18- She is here in follow up evaluation for a left great toe ulcer. It appears stable despite measurement change. We will continue with same treatment plan and follow up next week. 05/24/18-She is seen in follow-up evaluation for a left great toe ulcer. She remains compliant with offloading, has made significant improvement in her diet, decreasing the amount of sugar/soda. She said her recent A1c was 10.9 which is lower than. She did see a diabetic nutritionist/educator yesterday. She continues to smoke. We will continue with the same treatment plan and she'll follow-up next week. 05/31/18- She is seen in follow-up evaluation for left great toe ulcer. She continues to remain compliant with offloading, continues to make improvement in her diet, increasing her water and decreasing the amount of sugar/soda. She does continue to smoke with no desire to quit. We will apply Prisma to the depth and Hydrofera Blue over. We have not  received insurance authorization for oasis. She will follow up next week. 06/07/18-She is seen in follow-up evaluation for left great toe ulcer. It has stalled according to today's measurements although base appears stable. She says she saw a diabetic educator yesterday; her average blood sugars are less than 300 which is an improvement for her. She continues to smoke and states "that's my next step" She continues with water over soda. We will order for xray, culture and reinstate ace wrap compression prior to placing apligraf for next week. She is voicing no complaints or concerns. Her dressing will change to iodoflex over the next week in preparation for apligraf. 06/14/18-She is seen in follow-up evaluation for left great toe ulcer. Plain film x-ray performed last week was negative for osteomyelitis. Wound culture obtained last week grew strep B and OSSA; she is initiated on keflex and cefdinir today; there is erythema to the toe which could be from ace wrap compression, she has a history of wrapping too tight and has has been encouraged to maintain ace wraps that we place today. We will hold off on application of apligraf today, will apply next week after antibiotic therapy has been initiated. She admits today that she has resumed taking a shower with her foot/toe submerged in water, she has been reminded to keep foot/toe out of the bath water. She  will be seen in follow up next week 06/21/18-she is seen in follow-up evaluation for left great toe ulcer. She is tolerating antibiotic therapy with no GI disturbance. The wound is stable. Apligraf was applied today. She has been decreasing her smoking, only had 4 cigarettes yesterday and 1 today. She continues being more compliant in diabetic diet. She will follow-up next week for evaluation of site, if stable will remove at 2 weeks. 06/28/18- She is here in follow up evalution. Apligraf was placed last week, she states the dressing fell off on Tuesday and  she was dressing with hydrofera blue. She is healed and will be discharged from the clinic today. She has been instructed to continue with smoking cessation, continue monitoring glucose levels, offloading for an additional 4 weeks and continue with hydrofera blue for additional two weeks for any possible microscopic opening. Readmission: 08/07/18 on evaluation today patient presents for reevaluation concerning the ulcer of her right great toe. She was previously discharged on 06/28/18 healed. Nonetheless she states that this began to show signs of drainage she subsequently went to her primary care provider. Subsequently an x-ray was performed on 08/01/18 which was negative. The patient was also placed on antibiotics at that time. Fortunately they should have been effective for the infection. Nonetheless she's been experiencing some improvement but still has a lot of drainage coming from the wound itself. 08/14/18 on evaluation today patient's wound actually does show signs of improvement in regard to the erythema at this point. She has completed the antibiotics. With that being said we did discuss the possibility of placing her in a total contact cast as of today although I think that I may want to give this just a little bit more time to ensure nothing recurrence as far as her infection is concerned. I do not want to put in the cast and risk infection at that time if things are not completely resolved. With that being said she is gonna require some debridement today. 08/21/18 on evaluation today patient actually appears to be doing okay in regard to her toe ulcer. She's been tolerating the dressing changes without complication. With that being said it does appear that she is ready and in fact I think it's appropriate for [place] to go ahead and initiate the total contact cast today. Nonetheless she will require some sharp debridement to prepare the wound for application. Overall I feel like things  have been progressing well but we do need to do something to get this to close more readily. 08/24/18 patient seen today for reevaluation after having had the total contact cast applied on Tuesday. She seems to have done very well the wound appears to be doing great and overall I'm pleased with the progress that she's made. There were no abnormal areas of rubbing from the cast on her lower extremity. 08/30/18 on evaluation today patient actually appears to be completely healed in regard to her plantar toe ulcer. She tells me at this point she's been having a lot of issues with the cast. She almost fell a couple of times the state shall the step of her dog a couple times as well. This is been a very frustrating process for her other nonetheless she has completely healed the wound which is excellent news. Overall there does not appear to be the evidence of infection at this time which is great news. 09/11/18 evaluation today patient presents for follow-up concerning her great toe ulcer on the left which has unfortunately reopened since  I last saw her which was only a couple of weeks ago. Unfortunately she was not able to get in to get the shoe and potentially the AFO that's gonna be necessary due to her left foot drop. She continues with offloading shoe but this is not enough to prevent her from reopening it appears. When we last had her in the total contact cast she did well from a healing standpoint but unfortunately the wound reopened as soon as she came out of the cast within just a couple of weeks. 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 This is also part of the reason why she falls on a regular basis. I just do not believe that is gonna be safe for [place] to reinitiate the total contact cast as last  time we had this on she fell 3 times one week which is definitely not normal for her. 09/18/18 upon evaluation today the patient actually appears to be doing about the same in regard to her toe ulcer. She did not contact [facility] as I asked her to even though I had given her the prescription. In fact she actually states that she has no idea where the prescription is. She did apparently call [employer] and they told her that all she needed to do was bring the prescription in order to be able to be seen and work on getting the AFO for her left foot. With all that being said she still does not have an appointment and I'm not sure were things stand that regard. I will give her a new prescription today in order to contact them to get this set up. [name], [name] ([id number]) 09/25/18 on evaluation today patient actually appears to be doing about the same in regard to her toes ulcer. She does have a small areas which seems to have a lot of callous buildup around the edge of the wound which is going to need sharp debridement today. She still is waiting to be scheduled for evaluation with [facility] for possibility of an AFO. She states there supposed to call her tomorrow to get this set up. Unfortunately it does appear that her foot specifically the toe area is showing signs of erythema. There does not appear to be any systemic infection which is in these good news. 10/02/18 on evaluation today patient actually appears to be doing about the same in regard to her toe ulcer. This really has not done too well although it's not significantly larger it's also not significantly smaller. She has been tolerating the dressing changes without complication. She actually has her appointment with [facility] and [place] tomorrow to hopefully be measured for obtaining and AFO splint. I think this would be helpful preventing this from reoccurring. We had contemplated starting the cast this week although to be honest I am reluctant  to do that as she's been having nausea, vomiting, and seizure activity over the past three days. She has a history of seizures and have been told is nothing that can be done for these. With that being said I do believe that along with the seizures have the nausea vomiting which upon further questioning doesn't seem to be the normal for her and makes me concerned for the possibility of infection or something else going on. I discussed  this with the patient and her mother during the office visit today. I do not feel the wound is effective but maybe something else. The responses this was "this just happens to her at times and we don't know why". They did not seem to be interested in going to the hospital to have this checked out further. 10/09/18 on evaluation today patient presents for follow-up concerning her ongoing toe ulcer. She has been tolerating the dressing changes without complication. Fortunately there does not appear to be any evidence of infection which is great news however I do think that the patient would benefit from going ahead for with the total contact cast. She's actually in a wheelchair today she tells me that she will use her walker if we initiate the cast. I was very specific about the fact that if we were gonna do the cast I wanted to make sure that she was using the walker in order to prevent any falls. She tells me she does not have stairs that she has to traverse on a regular basis at her home. She has not had any seizures since last week again that something that happens to her often she tells me she did talk to [employer] and they said that it may take up to three weeks to get the brace approved for her. Hopefully that will not take that long but nonetheless in the meantime I do think the cast could be of benefit. 10/12/18 on evaluation today patient appears to be doing rather well in regard to her toe ulcer. It's just been a few days and already this is significantly improved  both as far as overall appearance and size. Fortunately there's no sign of infection. She is here for her first obligatory cast change. 10/19/18 Seen today for follow up and management of left great toe ulcer. Wound continues to show improvement. Noted small open area with seroussang drainage with palpation. Denies any increased pain or recent fevers during visit. She will continue calcium alginate with offloading shoe. Denies any questions or concerns during visit. 10/26/18 on evaluation today patient appears to be doing about the same as when I last saw her in regard to her wound bed. Fortunately there does not appear to be any signs of infection. Unfortunately she continues to have a breakdown in regard to the toe region any time that she is not in the cast. It takes almost no time at all for this to happen. Nonetheless she still has not heard anything from the brace being made by [employer] as to when exactly this will be available to her. Fortunately there is no signs of infection at this time. 10/30/18 on evaluation today patient presents for application of the total contact cast as we just received him this morning. Fortunately we are gonna be able to apply this to her today which is great news. She continues to have no significant pain which is good news. Overall I do feel like things have been improving while she was the cast is when she doesn't have a cast that things get worse. She still has not really heard anything from [place] regarding her brace. 11/02/18 upon evaluation today patient's wound already appears to be doing significantly better which is good news. Fortunately there does not appear to be any signs of infection also good news. Overall I do think the total contact cast as before is helping to heal this area unfortunately it's just not gonna likely keep the area closed and healed without her  getting her brace at least. Again the foot drop is a significant issue for her. 11/09/18 on  evaluation today patient appears to be doing excellent in regard to her toe ulcer which in fact is completely healed. Fortunately we finally got the situation squared away with the paperwork which was needed to proceed with getting her brace approved by Medicaid. I have filled that out unfortunately that information has been sent to the orthopedic office that I worked at 2 1/2 years ago and not tired Current wound care measures. Fortunately she seems to be doing very well at this time. 11/23/18 on evaluation today patient appears to be doing More Poorly Compared to Last Time I Saw Her. At [facility] She Had Completely Healed. Currently she is continuing to have issues with reopening. She states that she just found out that the brace was approved through Medicaid now she just has to go get measured in order to have this fitted for her and then made. Subsequently she does not have an appointment for this yet that is going to complicate things we obviously cannot put her back in the cast if we do not have everything measured because they're not gonna be able to measure her foot while she is in the cast. 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 Obviously this is unfortunate and does have me somewhat worried as well. 11/30/18 on evaluation today patient's toe ulcer actually appears to be doing fairly well. The good news is she will be getting her brace in the shoes next week on Wednesday. Hopefully we will be able to get this to heal without having to go back in the cast however she may need the cast in order to get the wound completely heal and then go from there. Fortunately there's no signs of infection at this time. 12/07/18 on evaluation today patient fortunately did receive her brace and she states she could tell this definitely makes her walk better. With that being said she's been having issues with her  toe where she noticed yesterday there was a lot of tissue that was loosing off this appears to be much larger than what it was previous. She also states that her leg has been read putting much across the top of her foot just about the ankle although this seems to be receiving somewhat. The total area is still red and appears to be someone infected as best I can tell. She is previously taken Bactrim and that may be a good option for her today as well. We are gonna see what I wound culture shows as well and I think that this is definitely appropriate. With that being said outside of the culture I still need to initiate something in the interim and that's what I'm gonna go ahead and select Bactrim is a good option for her. 12/14/18 on evaluation today patient appears to be doing better in regard to her left great toe ulcer as compared to last week's evaluation. There's still some erythema although this is significantly improved which is excellent news. Overall I do believe that she is making good progress is still gonna take some time before she is where I would like her to be from the standpoint of being able to place her back into the total contact cast. Hopefully we will be where we need to be by next week. 12/21/18 on evaluation today  patient actually appears to be doing poorly in regard to her toe ulcer. She's been tolerating the dressing changes without complication. Fortunately there's no signs of systemic infection although she does have a lot of drainage from the toe ulcer and this does [name], [name] ([id number]) seem to be causing some issues at this point. She does have erythema on the distal portion of her toe that appears to be likely cellulitis. 12/28/18 on evaluation today patient actually appears to be doing a little better in my pinion in regard to her toe ulcer. With that being said she still does have some evidence of infection at this time and for her culture she had both E. coli as  well as enterococcus as organisms noted on evaluation. For that reason I think that though the Keflex likely has treated the E. coli rather well this has really done nothing for the enterococcus. We are going to have to initiate treatment for this specifically. 01/04/19 on evaluation today patient's toe actually appears to be doing better from the standpoint of infection. She currently would like to see about putting the cash back on I think that this is appropriate as long as she takes care of it and keeps it from getting wet. She is gonna have some drainage we can definitely pass this up with Drawtex and alginate to try to prevent as much drainage as possible from causing the problems. With that being said I do want to at least try her with the cast between now and Tuesday. If there any issues we can't continue to use it then I will discontinue the use of the cast at that point. 01/08/19 on evaluation today patient actually appears to be doing very well as far as her foot ulcer specifically the great toe on the left is concerned. She did have an area of rubbing on the medial aspect of her left ankle which again is from the cast. Fortunately there's no signs of infection at this point in this appears to be a very slight skin breakdown. The patient tells me she felt it rubbing but didn't think it was that bad. Fortunately there is no signs of active infection at this time which is good news. No fevers, chills, nausea, or vomiting noted at this time. 01/15/19 on evaluation today patient actually appears to be doing well in regard to her toe ulcer. Again as previous she seems to do well and she has the cast on which indicates to me that during the time she doesn't have a cast on she's putting way too much pressure on this region. Obviously I think that's gonna be an issue as with the current national emergency concerning the Covid-19 Virus it has been recommended that we discontinue the use of total  contact casting by the chief medical officer of our company, Dr. Simona Huh. The reasoning is that if a patient becomes sick and cannot come into have the cast removed they could not just leave this on for an additional two weeks. Obviously the hospitals also do not want to receive patient's who are sick into the emergency department to potentially contaminate the region and spread the Covid-19 Virus among other sick individuals within the hospital system. Therefore at this point we are suspending the use of total contact cast until the current emergency subsides. This was all discussed with the patient today as well. 01/22/19 on evaluation today patient's wound on her left great toe appears to be doing slightly worse than previously noted last  week. She tells me that she has been on this quite a bit in fact she tells me she's been awake for 38 straight hours. This is due to the fact that she's having to care for grandparents because nobody else will. She has been taking care of them for five the last seven days since I've seen her they both have dementia his is from a stroke and her grandmother's was progressive. Nonetheless she states even her mom who knows her condition and situation has only help two of those days to take care of them she's been taking care of the rest. Fortunately there does not appear to be any signs of active infection in regard to her toe at this point although obviously it doesn't look as good as it did previous. I think this is directly related to her not taking off the pressure and friction by way of taking things easy. Though I completely understand what's going on. 01/29/19 on evaluation today patient's tools are actually appears to be showing some signs of improvement today compared to last week's evaluation as far as not necessarily the overall size of the wound but the fact that she has some new skin growth in between the two ends of the wound opening. Overall I feel like she  has done well she states that she had a family member give her what sounds to be a CAM walker boot which has been helpful as well. 02/05/19 on evaluation today patient's wound bed actually appears to be doing significantly better in regard to her overall appearance of the size of the wound. With that being said she is still having an issue with offloading efficiently enough to get this to close. Apparently there is some signs of infection at this point as well unfortunately. Previously she's done well of Augmentin I really do not see anything that needs to be culture currently but there theme and cellulitis of the foot that I'm seeing I'm gonna go ahead and place her on an antibiotic today to try to help clear this up. 02/12/2019 on evaluation today patient actually appears to be doing poorly in regard to her overall wound status. She tells me she has been using her offloading shoe but actually comes in today wearing her tennis shoe with the AFO brace. Again as I previously discussed with her this is really not sufficient to allow the area to heal appropriately. Nonetheless she continues to be somewhat noncompliant and I do wonder based on what she has told my nurse in the past as to whether or not a good portion of this noncompliance may be recreational drug and alcohol related. She has had a history of heroin overdose and this was fairly recently in the past couple of months that have been seeing her. Nonetheless overall I feel like her wound looks significantly worse today compared to what it was previous. She still has significant erythema despite the Augmentin I am not sure that this is an appropriate medication for her infection I am also concerned that the infection may have gone down into her bone. 02/19/19 on evaluation today patient actually appears to be doing about the same in regard to her toe ulcer. Unfortunately she continues to show signs of bone exposure and infection at this point.  There does not appear to be any evidence of worsening of the infection but I'm also not really sure that it's getting significantly better. She is on the Augmentin which should be sufficient for the Staphylococcus aureus  infection that she has at this point. With that being said she may need IV antibiotics to more appropriately treat this. We did have a discussion today about hyperbaric option therapy. 02/28/19 on evaluation today patient actually appears to be doing much worse in regard to the wound on her left great toe as compared to even my previous evaluation last week. Unfortunately this seems to be training in a pretty poor direction. Her toe was actually now starting to Heather laterally and I can actually see the entire joint area of the proximal portion of the digit where is the distal portion of the digit again is no longer even in contact with the joint line. Unfortunately there's a lot more necrotic tissue around the edge and the toe appears to be showing signs of becoming gangrenous in my pinion. I'm very concerned about were things stand at this point. She did see infectious disease and they are planning to send in a prescription for Sivextro for her and apparently this has been approved. With that being said I don't think she should avoid taking this but at the same time I'm not sure that it's gonna be sufficient to save her toe at this point. 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 03/05/19 on evaluation today patient actually appears to be doing quite well in regard to her toe all things considering. She still has bone exposed but there appears to be much less your thing on overall the appearance of the wound and the toe itself is dramatically improved. She still does have some  issues currently obviously with infection she did see vascular as well and there concerned that her blood flow to the toad. For that reason they are setting up for an angiogram next week. 03/14/19 on evaluation today patient appears to be doing very poor in regard to her toe and specifically in regard to the ulceration and the fact that she's starting to notice the toe was leaning even more towards the lateral aspect and the complete joint is visible on the proximal aspect of the joint. Nonetheless she's also noted a significant odor and the tip of the toe is turning more dark and necrotic appearing. Overall I think she is getting worse not better as far as this is concerned. For that reason I am recommending at this point that she likely needs to be seen for likely amputation. [name], [name] ([id number]) READMISSION 03/19/2021 This is a patient that we cared for in this clinic for a prolonged period of time in 2019 and 2020 with a left foot and left first toe wound. 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 Luana Shu. In December 2021 she had an ulcer on her right great toe as well as the fourth and fifth toes. 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 She also had an angiogram at that time and underwent angioplasty of the right anterior tibial artery. In any case she claims that the wound on the right foot is closed I did not look at this today which was probably an oversight although I think that should be done next week. After her  surgery she developed a dehiscence but I do not see any follow-up of this. According to Dr. Deborra Medina last review that she was out of the area being cared for by another physician but recently came back to his attention. 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 A culture of this area showed E. coli  apparently before she came back to see Dr. Luana Shu she was supposed to be receiving antibiotics but she did not really take them. Nor is she offloading this area at all. Finally her last hemoglobin A1c listed in epic was in March 2022 at 14.1 she says things are a lot better since then although I am not sure. 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 She had this wound at the time. During this admission she had E. coli sepsis a TEE was negative. 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 She is still a heavy cigarette smoker. She has a history of bipolar disorder seizure disorder ABI in our clinic was 1.05 6/1; left midfoot in the setting of a TMA done previously. Round circular wound with a "knuckle" of protruding tissue. The problem is that the knuckle was not attached to any of the surrounding granulation and this probed proximally widely I removed a large portion of this tissue. This wound goes with considerable undermining laterally. I do not feel any bone there was no purulence but this is a deep wound. 6/8; in spite of the debridement I did last week. She arrives with a wound looking exactly the same. A protruding "knuckle" of tissue nonadherent to most of the surrounding tissue. There is considerable depth around this from 6-12 o'clock at 2.7 cm and undermining of 1 cm. This does not look overtly infected and the x-ray I did last week was negative for any osseous abnormalities. We have been using silver collagen 6/15; deep tissue culture I did last week showed moderate staph aureus and moderate Pseudomonas. This will definitely require  prolonged antibiotic therapy. The pathology on the protuberant area was negative for malignancy fungus etc. 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 We have been using silver collagen. I am going to be prescribing Levaquin for 2 weeks. Her CT scan of the foot is down for 7/5 6/22; CT scan of the foot on 7 5. She says she has hardware in the left leg from her previous fracture. She is on the Levaquin for the deep tissue culture I did that showed methicillin sensitive staph aureus and Pseudomonas. I gave her a 2-week supply and she will have another week. She arrives in clinic today with the same protuberant tissue however this is nonadherent to the tissue surrounding it. I am really at a loss to explain this unless there is underlying deep tissue infection 6/29; patient presents for 1 week follow-up. She has been using collagen to the wound bed. She reports taking her antibiotics as prescribed.She has no complaints or issues today. She denies signs of infection. 7/6; patient presents for one week followup. She has been using collagen to the wound bed. She states she is taking Levaquin however at times she is not able to keep it down. She denies signs of infection.  7/13; patient presents for 1 week follow-up. She has been using silver alginate to the wound bed. She still has nausea when taking her antibiotics. She denies signs of infection. 7/20; patient presents for 1 week follow-up. She has been using silver alginate with gentamicin cream to the wound bed. She denies any issues and has no complaints today. She denies signs of infection. 7/27; patient presents for 1 week follow-up. She continues to use silver alginate with gentamicin cream to the wound bed. She reports starting her antibiotics. She has no issues or complaints. Overall she reports stability to the wound. 8/3; patient presents for 1 week follow-up. She has been using silver  alginate with gentamicin cream to the wound bed. She reports completing all antibiotics. She has no issues or complaints today. She denies signs of infection. 8/17; patient presents for 2-week follow-up. He is to use silver alginate to the wound bed. She has no issues or complaints today. She denies signs of infection. She reports her pain has improved in her foot since last clinic visit 8/24; patient presents for 1 week follow-up. She continues to use silver alginate to the wound bed. She has no issues or complaints. She denies signs of infection. Pain is stable. 9/7; patient presents for follow-up. She missed her last week appointment due to feeling ill. She continues to use silver alginate. She has a new wound to the right lower extremity that is covered in eschar. She states It occurred over the past week and has no idea how it started. She currently denies signs of infection. 9/14; patient presents for follow-up. To the left foot wound she has been using gentamicin cream and silver alginate. To the right lower extremity wound she has been keeping this covered and has not obtain Santyl. 9/21; patient presents for follow-up. She reports using gentamicin cream and silver alginate to the left foot and Santyl to the right lower extremity wound. She has no issues or complaints today. She denies signs of infection. 9/28; patient presents for follow-up. She reports a new wound to her right heel. She states this occurred a few days ago and is progressively gotten worse. She has been trying to clean the area with a Q-tip and Santyl. She reports stability in the other 2 wounds. She has been using gentamicin cream and silver alginate to the left foot and Santyl to the right lower extremity wound. 10/12; patient presents for follow-up. She reports improvement to the wound beds. She is seeing vein and vascular to discuss the potential of a left BKA. She states they are going to do an arteriogram. She  continues to use silver alginate with dressing changes to her wounds. 11/2; patient presents for follow-up. She states she has not been doing dressing changes to the wound beds. She states she is not able to offload the areas. She reports chronic pain to her left foot wound. 11/9; patient presents for follow-up. She came in with only socks on. She states she forgot to put on shoes. It is unclear if she is doing any dressing changes. She currently denies systemic signs of infection. 11/16; patient presents for follow-up. She came again only with socks on. She states she does not wear shoes ever. It is unclear if she does dressing changes. She currently denies systemic signs of infection. 11/23; patient presents for follow-up. She wore her shoes today. It still unclear exactly what dressing she is using for each wound but she did states she obtained Dakin's solution and has  been using this to the left foot wound. She currently denies signs of infection. CHYAN, CARNERO ([id number]) 11/30; patient presents for follow-up. She has no issues or complaints today. She currently denies signs of infection. 12/7; patient presents for follow-up. She has no issues or complaints today. She has been using Hydrofera Blue to the right heel wound and Dakin solution to the left foot wound. Her right anterior leg wound is healed. She currently denies signs of infection. 12/14; patient presents for follow-up. She has been using Hydrofera Blue to the right heel and Dakin's to the left foot wounds. She has no issues or complaints today. She denies signs of infection. 12/21; patient presents for follow-up. She reports using Hydrofera Blue to the right heel and Dakin's to the left foot wound. She denies signs of infection. 12/28; patient presents for follow-up. She continues to use Dakin's to the left foot wound and Hydrofera Blue to the right heel wound. She denies signs of infection. 1/4; patient presents for  follow-up. She has no issues or complaints today. She denies signs of infection. 1/11; patient presents for follow-up. It is unclear if she has been dressing these wounds over the past week. She currently denies signs of infection. 1/18; patient presents for follow-up. She states she has been using Dakin's wet-to-dry dressings to the left foot. She has been using Hydrofera Blue to the right foot foot wound. She states that the anterior right leg wound has reopened and draining serous fluid. She denies signs of infection. 1/25; patient presents for follow-up. She has no issues or complaints today. 2/1; patient presents for follow-up. She has no issues or complaints today. She denies signs of infection. 2/8; patient presents for follow-up. She has lost her surgical shoes. She did not have a dressing to the right heel wound. She currently denies signs of infection. Objective Constitutional Vitals Time Taken: 11:04 AM, Height: 69 in, Weight: 185 lbs, BMI: 27.3, Temperature: 98 F, Pulse: 104 bpm, Respiratory Rate: 18 breaths/min, Blood Pressure: 141/100 mmHg. General Notes: pt states headache. MD Heber [place] made aware General Notes: Left foot (Transmetatarsal amputation): Wound to the plantar aspect that has granulation tissue and scant nonviable tissue And circumferential Callus. There is hyper granulated tissue present as well. Right foot: To the heel there is an open wound with Granulation tissue , And nonviable tissue with Circumferential callus. No obvious signs of infection to any wound. Right lower extremity: To the anterior aspect there is A pinpoint open wound. Integumentary (Hair, Skin) Wound #10 status is Open. Original cause of wound was Gradually Appeared. The date acquired was: 11/04/2021. The wound has been in treatment 3 weeks. The wound is located on the Right,Midline Lower Leg. The wound measures 0.2cm length x 0.2cm width x 0.1cm depth; 0.031cm^2 area and 0.003cm^3 volume. There is  Fat Layer (Subcutaneous Tissue) exposed. There is no tunneling or undermining noted. There is a none present amount of drainage noted. There is large (67-100%) pink, pale granulation within the wound bed. There is a small (1-33%) amount of necrotic tissue within the wound bed including Adherent Slough. Wound #7 status is Open. Original cause of wound was Gradually Appeared. The date acquired was: 08/07/2020. The wound has been in treatment 36 weeks. The wound is located on the [place]. The wound measures 4.9cm length x 1.5cm width x 0.3cm depth; 5.773cm^2 area and 1.732cm^3 volume. There is Fat Layer (Subcutaneous Tissue) exposed. There is no tunneling or undermining noted. There is a large amount of serosanguineous  drainage noted. The wound margin is thickened. There is large (67-100%) red, pink, hyper - granulation within the wound bed. There is a small (1-33%) amount of necrotic tissue within the wound bed including Adherent Slough. Wound #9 status is Open. Original cause of wound was Gradually Appeared. The date acquired was: 07/18/2021. The wound has been in treatment 19 weeks. The wound is located on the Right Calcaneus. The wound measures 0.5cm length x 0.8cm width x 0.4cm depth; 0.314cm^2 area and 0.126cm^3 volume. There is Fat Layer (Subcutaneous Tissue) exposed. There is no tunneling or undermining noted. There is a medium amount of serosanguineous drainage noted. The wound margin is well defined and not attached to the wound base. There is small (1-33%) pink granulation within the wound bed. There is a large (67-100%) amount of necrotic tissue within the wound bed including Eschar and Adherent Slough. SHELVY, [name] A. 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 I debrided nonviable tissue. I used silver nitrate to the hyper granulated areas. No signs of surrounding infection. I recommended continuing Dakin's wet-to-dry dressing to the left foot and Hydrofera Blue to the right foot wound. The anterior right leg wound is almost healed. There is a pinpoint open wound and I recommended a foam border dressing to this. She states she lost her surgical shoes. We will give her an offloading shoe for the right heel and a surgical shoe to the left foot. Follow-up in 1 week. Procedures Wound #7 Pre-procedure diagnosis of Wound #7 is a Diabetic Wound/Ulcer of the Lower Extremity located on the [place] .Severity of Tissue Pre Debridement is: Fat layer exposed. There was a Excisional Skin/Subcutaneous Tissue Debridement with a total area of 7.35 sq cm performed by Kalman Shan, MD. With the following instrument(s): Curette, silver nitrate for hypergranulation to remove Viable and Non-Viable tissue/material. Material removed includes Callus and Subcutaneous Tissue and after achieving pain control using Lidocaine 4% Topical Solution. No specimens were taken. A time out was conducted at 11:34, prior to the start of the procedure. A Minimum amount of bleeding was controlled with Pressure. The procedure was tolerated well. Post Debridement Measurements: 4.9cm length x 1.5cm width x 0.3cm depth; 1.732cm^3 volume. Character of Wound/Ulcer Post Debridement is stable. Severity of Tissue Post Debridement is: Fat layer exposed. Post procedure Diagnosis Wound #7: Same as Pre-Procedure Wound #9 Pre-procedure diagnosis of Wound #9 is a Diabetic Wound/Ulcer of the Lower Extremity located on the Right Calcaneus  .Severity of Tissue Pre Debridement is: Fat layer exposed. There was a Excisional Skin/Subcutaneous Tissue Debridement with a total area of 0.4 sq cm performed by Kalman Shan, MD. With the following instrument(s): Curette to remove Viable and Non-Viable tissue/material. Material removed includes Subcutaneous Tissue and Slough and. No specimens were taken. A time out was conducted at 11:36, prior to the start of the procedure. A Minimum amount of bleeding was controlled with Pressure. The procedure was tolerated well. Post Debridement Measurements: 0.5cm length x 0.8cm width x 0.4cm depth; 0.126cm^3 volume. Character of Wound/Ulcer Post Debridement is stable. Severity of Tissue Post Debridement is: Fat layer  exposed. Post procedure Diagnosis Wound #9: Same as Pre-Procedure Plan Follow-up Appointments: Return Appointment in 1 week. Bathing/ Shower/ Hygiene: Wash wounds with antibacterial soap and water. Off-Loading: Open toe surgical shoe with peg assist. Turn and reposition every 2 hours Other: - peg shoe right, off-loader left Medications-Please add to medication list.: P.O. Antibiotics Other: - per MD He[facility] [place] ok to leave right lower leg dressing on for 3-4 days and then continue daily WOUND #10: - Lower Leg Wound Laterality: Right, Midline Primary Dressing: Zetuvit Plus Silicone Border Dressing 4x4 (in/in) 1 x Per Day/30 Days WOUND #7: - Foot Wound Laterality: Plantar, Left, Midline Cleanser: Dakin 16 (oz) 0.25 1 x Per Day/30 Days Discharge Instructions: Use as directed. Primary Dressing: Gauze 1 x Per Day/30 Days Discharge Instructions: As directed: moistened with Dakins Solution Secondary Dressing: Kerlix 4.5 x 4.1 (in/yd) 1 x Per Day/30 Days Discharge Instructions: Apply Kerlix 4.5 x 4.1 (in/yd) as instructed Secured With: Kerlix Roll Sterile or Non-Sterile 6-ply 4.5x4 (yd/yd) 1 x Per Day/30 Days Preyer, Maribelle A. ([id number]) Discharge Instructions: Apply Kerlix as  directed WOUND #9: - Calcaneus Wound Laterality: Right Cleanser: Soap and Water 1 x Per Day/30 Days Discharge Instructions: Gently cleanse wound with antibacterial soap, rinse and pat dry prior to dressing wounds Primary Dressing: Hydrofera Blue Ready Transfer Foam, 2.5x2.5 (in/in) 1 x Per Day/30 Days Discharge Instructions: Apply Hydrofera Blue Ready to wound bed as directed Secondary Dressing: Zetuvit Plus Silicone Border Dressing 4x4 (in/in) 1 x Per Day/30 Days Secured With: coverlet 1 x Per Day/30 Days 1. In office sharp debridement 2. Aggressive offloading surgical shoe and offloading shoe 3. Hydrofera Blue 4. Dakin's wet-to-dry 5. Follow-up in 1 week Electronic Signature(s) Signed: 12/01/2021 1:15:53 PM By: Kalman Shan DO Entered By: Kalman Shan on 12/01/2021 13:14:24 [name] ([id number]) -------------------------------------------------------------------------------- SuperBill Details Patient Name: Heather Dear A. Date of Service: 12/01/2021 Medical Record Number: [id number] Patient Account Number: [id number] Date of Birth/Sex: 07/16/1976 (32 [name].o. F) Treating RN: Levora Dredge Primary Care Provider: Raelene Bott Other Clinician: Referring Provider: Raelene Bott Treating Provider/Extender: Yaakov Guthrie in Treatment: 36 Diagnosis Coding ICD-10 Codes Code Description [phone] 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ity L97.512 Non-pressure chronic ulcer of other part of right foot with fat layer exposed L97.812 Non-pressure chronic ulcer of other part of right lower leg with fat layer exposed E11.621 Type 2 diabetes mellitus with foot ulcer E11.42 Type 2 diabetes mellitus with diabetic polyneuropathy L97.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 of skin Facility Procedures CPT4 Code: [id number] Description: 11042 - DEB SUBQ TISSUE 20 SQ CM/< Modifier: Quantity:  1 CPT4 Code: Description: ICD-10 Diagnosis Description 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 L97.512 Non-pressure chronic ulcer of other part of right foot with fat layer expo E11.621 Type 2 diabetes mellitus with foot  ulcer Modifier: severity sed Quantity: Physician Procedures CPT4 Code: [id number] Description: 11042 - WC PHYS SUBQ TISS 20 SQ CM Modifier: Quantity: 1 CPT4 Code: Description: ICD-10 Diagnosis Description 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 L97.512 Non-pressure chronic ulcer of other part of right foot with fat layer expo E11.621 Type 2 diabetes mellitus with foot  ulcer Modifier: severity sed Quantity: Electronic Signature(s) Signed: 12/01/2021 1:15:53 PM By: Kalman Shan DO Entered By: Kalman Shan on 12/01/2021 13:14:49

## 2021-12-01 NOTE — Progress Notes (Signed)
BIRGITTA, [name] ([id number]) Visit Report for 12/01/2021 Arrival Information Details Patient Name: [name], [name] Date of Service: 12/01/2021 10:30 AM Medical Record Number: [id number] Patient Account Number: [id number] Date of Birth/Sex: [birth year] (46 y.o. F) Treating RN: Levora Dredge Primary Care Sydni Elizarraraz: Raelene Bott Other Clinician: Referring Daylah Sayavong: Raelene Bott Treating Vinie Charity/Extender: Yaakov Guthrie in Treatment: 44 Visit Information History Since Last Visit Added or deleted any medications: No Patient Arrived: Wheel Chair Any new allergies or adverse reactions: No Arrival Time: 11:01 Had a fall or experienced change in No Accompanied By: mother activities of daily living that may affect Transfer Assistance: EasyPivot Patient Lift risk of falls: Patient Identification Verified: Yes Hospitalized since last visit: No Secondary Verification Process Completed: Yes Has Dressing in Place as Prescribed: Yes Patient Requires Transmission-Based No Has Footwear/Offloading in Place as Prescribed: No Precautions: Pain Present Now: No Patient Has Alerts: Yes Patient Alerts: Patient on Blood Thinner 41m apirin Diabetic Type II Electronic Signature(s) Signed: 12/01/2021 4:45:40 PM By: GLevora DredgeEntered By: GLevora Dredgeon 12/01/2021 11:04:09 SLonzo Candy([id number] -------------------------------------------------------------------------------- Clinic Level of Care Assessment Details Patient Name: [name] Date of Service: 12/01/2021 10:30 AM Medical Record Number: [id number] Account Number: [id number] of Birth/Sex: 922-Sep-1977(46y.o. F) Treating RN: GLevora DredgePrimary Care Eula Jaster: HRaelene BottOther Clinician: Referring Zyara Riling: HRaelene BottTreating Kandie Keiper/Extender: HYaakov Guthriein Treatment: 367Clinic Level of Care Assessment Items TOOL 1 Quantity Score []  - Use when EandM and Procedure is  performed on INITIAL visit 0 ASSESSMENTS - Nursing Assessment / Reassessment []  - General Physical Exam (combine w/ comprehensive assessment (listed just below) when performed on new 0 pt. evals) []  - 0 Comprehensive Assessment (HX, ROS, Risk Assessments, Wounds Hx, etc.) ASSESSMENTS - Wound and Skin Assessment / Reassessment []  - Dermatologic / Skin Assessment (not related to wound area) 0 ASSESSMENTS - Ostomy and/or Continence Assessment and Care []  - Incontinence Assessment and Management 0 []  - 0 Ostomy Care Assessment and Management (repouching, etc.) PROCESS - Coordination of Care []  - Simple Patient / Family Education for ongoing care 0 []  - 0 Complex (extensive) Patient / Family Education for ongoing care []  - 0 Staff obtains CProgrammer, systems Records, Test Results / Process Orders []  - 0 Staff telephones HHA, Nursing Homes / Clarify orders / etc []  - 0 Routine Transfer to another Facility (non-emergent condition) []  - 0 Routine Hospital Admission (non-emergent condition) []  - 0 New Admissions / IBiomedical engineer/ Ordering NPWT, Apligraf, etc. []  - 0 Emergency Hospital Admission (emergent condition) PROCESS - Special Needs []  - Pediatric / Minor Patient Management 0 []  - 0 Isolation Patient Management []  - 0 Hearing / Language / Visual special needs []  - 0 Assessment of Community assistance (transportation, D/C planning, etc.) []  - 0 Additional assistance / Altered mentation []  - 0 Support Surface(s) Assessment (bed, cushion, seat, etc.) INTERVENTIONS - Miscellaneous []  - External ear exam 0 []  - 0 Patient Transfer (multiple staff / HCivil Service fast streamer/ Similar devices) []  - 0 Simple Staple / Suture removal (25 or less) []  - 0 Complex Staple / Suture removal (26 or more) []  - 0 Hypo/Hyperglycemic Management (do not check if billed separately) []  - 0 Ankle / Brachial Index (ABI) - do not check if billed separately Has the patient been seen at the hospital within  the last three years: Yes Total Score: 0 Level Of Care: ____ SLonzo Candy([id number] Electronic Signature(s) Signed: 12/01/2021 4:45:40 PM By: GLevora Dredge  Entered By: Levora Dredge on 12/01/2021 12:00:11 [name] ([id number]) -------------------------------------------------------------------------------- Encounter Discharge Information Details Patient Name: Heather, [name] Date of Service: 12/01/2021 10:30 AM Medical Record Number: [id number] Patient Account Number: [id number] Date of Birth/Sex: 1976/07/10 (46 y.o. F) Treating RN: Levora Dredge Primary Care Janace Decker: Raelene Bott Other Clinician: Referring Lacreshia Bondarenko: Raelene Bott Treating Taseen Marasigan/Extender: Yaakov Guthrie in Treatment: 57 Encounter Discharge Information Items Post Procedure Vitals Discharge Condition: Stable Temperature (F): 98 Ambulatory Status: Wheelchair Pulse (bpm): 104 Discharge Destination: Home Respiratory Rate (breaths/min): 18 Transportation: Private Auto Blood Pressure (mmHg): 141/100 Accompanied By: mother Schedule Follow-up Appointment: Yes Clinical Summary of Care: Electronic Signature(s) Signed: 12/01/2021 4:45:40 PM By: Levora Dredge Entered By: Levora Dredge on 12/01/2021 12:03:12 [name] ([id number]) -------------------------------------------------------------------------------- Lower Extremity Assessment Details Patient Name: Heather Dear A. Date of Service: 12/01/2021 10:30 AM Medical Record Number: [id number] Patient Account Number: [id number] Date of Birth/Sex: February 02, 1976 (46 y.o. F) Treating RN: Levora Dredge Primary Care Chyan Carnero: Raelene Bott Other Clinician: Referring Zela Sobieski: Raelene Bott Treating Arsen Mangione/Extender: Yaakov Guthrie in Treatment: 36 Edema Assessment Assessed: [Left: No] [Right: No] [Left: Edema] [Right: :] Calf Left: Right: Point of Measurement: 30 cm From Medial Instep 26.9 cm 28.8 cm Ankle Left:  Right: Point of Measurement: 10 cm From Medial Instep 17.7 cm 17.5 cm Vascular Assessment Pulses: Dorsalis Pedis Palpable: [Left:Yes] [Right:Yes] Posterior Tibial Palpable: [Left:Yes] [Right:Yes] Electronic Signature(s) Signed: 12/01/2021 4:45:40 PM By: Levora Dredge Entered By: Levora Dredge on 12/01/2021 11:09:03 Cornia, Hayward ([id number]) -------------------------------------------------------------------------------- Multi Wound Chart Details Patient Name: Heather Dear A. Date of Service: 12/01/2021 10:30 AM Medical Record Number: [id number] Patient Account Number: [id number] Date of Birth/Sex: May 28, 1976 (46 y.o. 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 Extremity Extremity Comorbid History: Chronic sinus problems/congestion, Chronic sinus problems/congestion, Chronic sinus problems/congestion, Middle ear problems, Anemia, Middle ear problems, Anemia, Middle ear problems, Anemia, Chronic Obstructive Pulmonary Chronic Obstructive Pulmonary Chronic Obstructive Pulmonary Disease (COPD), Congestive Heart Disease (COPD), Congestive Heart Disease (COPD), Congestive Heart Failure, Type II Diabetes, End Stage Failure, Type II Diabetes, End Stage Failure, Type II Diabetes, End Stage Renal Disease, History of pressure Renal Disease, History of pressure Renal Disease,  History of pressure wounds, Neuropathy wounds, Neuropathy wounds, Neuropathy Date Acquired: 11/04/2021 08/07/2020 07/18/2021 Weeks of Treatment: 3 36 19 Wound Status: Open Open Open Wound Recurrence: No No No Measurements L x W x D (cm) 0.2x0.2x0.1 4.9x1.5x0.3 0.5x0.8x0.4 Area (cm) : 0.031 5.773 0.314 Volume (cm) : 0.003 1.732 0.126 % Reduction in Area: 97.00% -116.20% 4.80% % Reduction in Volume: 97.10% -29.70% 45.50% Classification: Grade 1 Grade 2 Grade 1 Exudate Amount: None Present Large Medium Exudate Type: N/A Serosanguineous Serosanguineous Exudate Color: N/A red, brown red, brown Wound Margin: N/A Thickened Well defined, not attached Granulation Amount: Large (67-100%) Large (67-100%) Small (1-33%) Granulation Quality: Pink, Pale Red, Pink, Hyper-granulation Pink Necrotic Amount: Small (1-33%) Small (1-33%) Large (67-100%) Necrotic Tissue: Adherent [place] Exposed Structures: Fat Layer (Subcutaneous Tissue): Fat Layer (Subcutaneous Tissue): Fat Layer (Subcutaneous Tissue): Yes Yes Yes Fascia: No Fascia:  No Tendon: No Tendon: No Muscle: No Muscle: No Joint: No Joint: No Bone: No Bone: No Epithelialization: Small (1-33%) Small (1-33%) None Treatment Notes LAURAL, EILAND A. ([id number]) Electronic Signature(s) Signed: 12/01/2021 4:45:40 PM By: Levora Dredge Entered By: Levora Dredge on 12/01/2021 11:34:12 [name] ([id number]) -------------------------------------------------------------------------------- [place] Details Patient Name: Heather Dear A. Date of Service: 12/01/2021 10:30 AM Medical Record Number: [id number] Patient Account Number: [id number] Date of Birth/Sex: [birth year] (46 y.o. F) Treating RN: Levora Dredge Primary Care Neils [place]ac[place]: Raelene Bott Other Clinician: Referring Aprille Sawhney: Raelene Bott Treating Salvador Bigbee/Extender: Yaakov Guthrie in Treatment: 36 Active  Inactive Necrotic Tissue Nursing Diagnoses: Impaired tissue integrity related to necrotic/devitalized tissue Knowledge deficit related to management of necrotic/devitalized tissue Goals: Necrotic/devitalized tissue will be minimized in the wound bed Date Initiated: 04/07/2021 Target Resolution Date: 10/08/2021 Goal Status: Active Patient/caregiver will verbalize understanding of reason and process for debridement of necrotic tissue Date Initiated: 04/07/2021 Date Inactivated: 09/22/2021 Target Resolution Date: 09/08/2021 Goal Status: Met Interventions: Assess patient pain level pre-, during and post procedure and prior to discharge Provide education on necrotic tissue and debridement process Treatment Activities: Excisional debridement : 04/07/2021 Notes: Electronic Signature(s) Signed: 12/01/2021 4:45:40 PM By: Levora Dredge Entered By: Levora Dredge on 12/01/2021 11:33:21 Heather Dear A. ([id number]) -------------------------------------------------------------------------------- Pain Assessment Details Patient Name: Heather Dear A. Date of Service: 12/01/2021 10:30 AM Medical Record Number: [id number] Patient Account Number: [id number] Date of Birth/Sex: 1976/09/10 (46 y.o. F) Treating RN: Levora Dredge Primary Care Tawonda Legaspi: Raelene Bott Other Clinician: Referring Calypso Hagarty: Raelene Bott Treating Dorrell Mitcheltree/Extender: Yaakov Guthrie in Treatment: 32 Active Problems Location of Pain Severity and Description of Pain Patient Has Paino No Site Locations Rate the pain. Current Pain Level: 0 Pain Management and Medication Current Pain Management: Electronic Signature(s) Signed: 12/01/2021 4:45:40 PM By: Levora Dredge Entered By: Levora Dredge on 12/01/2021 11:05:41 [name] ([id number]) -------------------------------------------------------------------------------- Patient/Caregiver Education Details Patient Name: Heather Dear A. Date of Service:  12/01/2021 10:30 AM Medical Record Number: [id number] Patient Account Number: [id number] Date of Birth/Gender: 03-27-1976 (46 y.o. F) Treating RN: Levora Dredge Primary Care Physician: Raelene Bott Other Clinician: Referring Physician: Raelene Bott Treating Physician/Extender: Yaakov Guthrie in Treatment: 51 Education Assessment Education Provided To: Patient and Caregiver Education Topics Provided Wound Debridement: Handouts: Wound Debridement Methods: Explain/Verbal Responses: State content correctly Wound/Skin Impairment: Handouts: Caring for Your Ulcer Methods: Explain/Verbal Responses: State content correctly Electronic Signature(s) Signed: 12/01/2021 4:45:40 PM By: Levora Dredge Entered By: Levora Dredge on 12/01/2021 12:00:45 Heather Dear A. ([id number]) -------------------------------------------------------------------------------- Wound Assessment Details Patient Name: Heather Dear A. Date of Service: 12/01/2021 10:30 AM Medical Record Number: [id number] Patient Account Number: [id number] Date of Birth/Sex: 31-Jan-1976 (46 y.o. F) Treating RN: Levora Dredge Primary Care [name] Mello: Raelene Bott Other Clinician: Referring Nina Mondor: Raelene Bott Treating Annaclaire Walsworth/Extender: Yaakov Guthrie in Treatment: 36 Wound Status Wound Number: 10 Primary Diabetic Wound/Ulcer of the Lower Extremity Etiology: Wound Location: Right, Midline Lower Leg Wound Open Wounding Event: Gradually Appeared Status: Date Acquired: 11/04/2021 Comorbid Chronic sinus problems/congestion, Middle ear problems, Weeks Of Treatment: 3 History: Anemia, Chronic Obstructive Pulmonary Disease (COPD), Clustered Wound: No Congestive Heart Failure, Type II Diabetes, End Stage Renal Disease, History of pressure wounds, Neuropathy Photos Wound Measurements Length: (cm) 0.2 Width: (cm) 0.2 Depth: (cm) 0.1 Area: (cm) 0.031 Volume: (cm) 0.003 % Reduction in Area: 97% %  Reduction in Volume: 97.1% Epithelialization: Small (1-33%) Tunneling: No Undermining: No Wound Description Classification: Grade 1 Exudate Amount: None Present Foul Odor After Cleansing: No  Slough/Fibrino Yes Wound Bed Granulation Amount: Large (67-100%) Exposed Structure Granulation Quality: Pink, Pale Fat Layer (Subcutaneous Tissue) Exposed: Yes Necrotic Amount: Small (1-33%) Necrotic Quality: Adherent Slough Treatment Notes Wound #10 (Lower Leg) Wound Laterality: Right, Midline Cleanser Peri-Wound Care Topical Primary Dressing Zetuvit Plus Silicone Border Dressing 4x4 (in/in) Secondary Dressing Scalzo, Inara A. ([id number]) Secured With Compression Wrap Compression Stockings Add-Ons Electronic Signature(s) Signed: 12/01/2021 4:45:40 PM By: Levora Dredge Entered By: Levora Dredge on 12/01/2021 11:17:29 Bangerter, Starnisha A. ([id number]) -------------------------------------------------------------------------------- Wound Assessment Details Patient Name: Heather Dear A. Date of Service: 12/01/2021 10:30 AM Medical Record Number: [id number] Patient Account Number: [id number] Date of Birth/Sex: 1976-07-14 (46 y.o. F) Treating RN: Levora Dredge Primary Care Gaige Fussner: Raelene Bott Other Clinician: Referring Lesly Pontarelli: Raelene Bott Treating Devlin Mcveigh/Extender: Yaakov Guthrie in Treatment: 36 Wound Status Wound Number: 7 Primary Diabetic Wound/Ulcer of the Lower Extremity Etiology: Wound Location: Left, Midline, Plantar Foot Wound Open Wounding Event: Gradually Appeared Status: Date Acquired: 08/07/2020 Comorbid Chronic sinus problems/congestion, Middle ear problems, Weeks Of Treatment: 36 History: Anemia, Chronic Obstructive Pulmonary Disease (COPD), Clustered Wound: No Congestive Heart Failure, Type II Diabetes, End Stage Renal Disease, History of pressure wounds, Neuropathy Photos Wound Measurements Length: (cm) 4.9 % Width: (cm) 1.5 % Depth: (cm)  0.3 E Area: (cm) 5.773 Volume: (cm) 1.732 Reduction in Area: -116.2% Reduction in Volume: -29.7% pithelialization: Small (1-33%) Tunneling: No Undermining: No Wound Description Classification: Grade 2 Wound Margin: Thickened Exudate Amount: Large Exudate Type: Serosanguineous Exudate Color: red, brown Foul Odor After Cleansing: No Slough/Fibrino Yes Wound Bed Granulation Amount: Large (67-100%) Exposed Structure Granulation Quality: Red, Pink, Hyper-granulation Fascia Exposed: No Necrotic Amount: Small (1-33%) Fat Layer (Subcutaneous Tissue) Exposed: Yes Necrotic Quality: Adherent Slough Tendon Exposed: No Muscle Exposed: No Joint Exposed: No Bone Exposed: No Treatment Notes Wound #7 (Foot) Wound Laterality: Plantar, Left, Midline Cleanser Dakin 16 (oz) 0.25 Discharge Instruction: Use as directed. TALANI, BRAZEE A. ([id number]) Peri-Wound Care Topical Primary Dressing Gauze Discharge Instruction: As directed: moistened with Dakins Solution Secondary Dressing Kerlix 4.5 x 4.1 (in/yd) Discharge Instruction: Apply Kerlix 4.5 x 4.1 (in/yd) as instructed Secured With 25M Medipore H Soft Cloth Surgical Tape, 2x2 (in/yd) Compression Wrap Compression Stockings Add-Ons Electronic Signature(s) Signed: 12/01/2021 4:45:40 PM By: Levora Dredge Entered By: Levora Dredge on 12/01/2021 11:18:11 Dominic, [place] ([id number]) -------------------------------------------------------------------------------- Wound Assessment Details Patient Name: Heather Dear A. Date of Service: 12/01/2021 10:30 AM Medical Record Number: [id number] Patient Account Number: [id number] Date of Birth/Sex: 15-Jul-1976 (46 y.o. F) Treating RN: Levora Dredge Primary Care Jamariah Tony: Raelene Bott Other Clinician: Referring Kaelem Brach: Raelene Bott Treating Matis Monnier/Extender: Yaakov Guthrie in Treatment: 36 Wound Status Wound Number: 9 Primary Diabetic Wound/Ulcer of the Lower  Extremity Etiology: Wound Location: Right Calcaneus Wound Open Wounding Event: Gradually Appeared Status: Date Acquired: 07/18/2021 Comorbid Chronic sinus problems/congestion, Middle ear problems, Weeks Of Treatment: 19 History: Anemia, Chronic Obstructive Pulmonary Disease (COPD), Clustered Wound: No Congestive Heart Failure, Type II Diabetes, End Stage Renal Disease, History of pressure wounds, Neuropathy Photos Wound Measurements Length: (cm) 0.5 Width: (cm) 0.8 Depth: (cm) 0.4 Area: (cm) 0.314 Volume: (cm) 0.126 % Reduction in Area: 4.8% % Reduction in Volume: 45.5% Epithelialization: None Tunneling: No Undermining: No Wound Description Classification: Grade 1 Wound Margin: Well defined, not attached Exudate Amount: Medium Exudate Type: Serosanguineous Exudate Color: red, brown Foul Odor After Cleansing: No Slough/Fibrino Yes Wound Bed Granulation Amount: Small (1-33%) Exposed Structure Granulation Quality: Pink Fascia Exposed: No Necrotic Amount: Large (67-100%) Fat Layer (Subcutaneous Tissue) Exposed: Yes  Necrotic Quality: Eschar, Adherent Slough Tendon Exposed: No Muscle Exposed: No Joint Exposed: No Bone Exposed: No Treatment Notes Wound #9 (Calcaneus) Wound Laterality: Right Cleanser Soap and Water Discharge Instruction: Gently cleanse wound with antibacterial soap, rinse and pat dry prior to dressing wounds Frieling, Kamira A. ([id number]) Peri-Wound Care Topical Primary Dressing Hydrofera Blue Ready Transfer Foam, 2.5x2.5 (in/in) Discharge Instruction: Apply Hydrofera Blue Ready to wound bed as directed Secondary Dressing Zetuvit Plus Silicone Border Dressing 4x4 (in/in) Secured With coverlet Compression Wrap Compression Stockings Add-Ons Electronic Signature(s) Signed: 12/01/2021 4:45:40 PM By: Levora Dredge Entered By: Levora Dredge on 12/01/2021 11:18:52 Krisko, Jamelia A.  ([id number]) -------------------------------------------------------------------------------- Vitals Details Patient Name: Heather Dear A. Date of Service: 12/01/2021 10:30 AM Medical Record Number: [id number] Patient Account Number: [id number] Date of Birth/Sex: 02/01/1976 (46 y.o. F) Treating RN: Levora Dredge Primary Care Avabella Wailes: Raelene Bott Other Clinician: Referring Charlyn Vialpando: Raelene Bott Treating Bryson Palen/Extender: Yaakov Guthrie in Treatment: 93 Vital Signs Time Taken: 11:04 Temperature (F): 98 Height (in): 69 Pulse (bpm): 104 Weight (lbs): 185 Respiratory Rate (breaths/min): 18 Body Mass Index (BMI): 27.3 Blood Pressure (mmHg): 141/100 Reference Range: 80 - 120 mg / dl Notes pt states headache. MD Heber Redby made aware Electronic Signature(s) Signed: 12/01/2021 4:45:40 PM By: Levora Dredge Entered By: Levora Dredge on 12/01/2021 11:05:17

## 2021-12-08 ENCOUNTER — Other Ambulatory Visit: Payer: Self-pay

## 2021-12-08 ENCOUNTER — Encounter (HOSPITAL_BASED_OUTPATIENT_CLINIC_OR_DEPARTMENT_OTHER): Payer: Medicaid Other | Admitting: Internal Medicine

## 2021-12-08 DIAGNOSIS — E11621 Type 2 diabetes mellitus with foot ulcer: Secondary | ICD-10-CM | POA: Diagnosis not present

## 2021-12-08 DIAGNOSIS — L97512 Non-pressure chronic ulcer of other part of right foot with fat layer exposed: Secondary | ICD-10-CM | POA: Diagnosis not present

## 2021-12-08 DIAGNOSIS — L97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ity: Secondary | ICD-10-CM

## 2021-12-08 NOTE — Progress Notes (Signed)
DACEY, MILBERGER ([id number]) Visit Report for 12/08/2021 Chief Complaint Document Details Patient Name: [name], [name] Date of Service: 12/08/2021 10:30 AM Medical Record Number: [id number] Patient Account Number: [id number] Date of Birth/Sex: Mar 20, 1976 (46 y.o. F) Treating RN: Levora Dredge Primary Care Provider: Raelene Bott Other Clinician: Referring Provider: Raelene Bott Treating Provider/Extender: Yaakov Guthrie in Treatment: 37 Information Obtained from: Patient Chief Complaint 03/19/2021; patient referred by Dr. Luana Shu who has been looking after her left foot for quite [name] period of time for review of [name] nonhealing area in the left midfoot Electronic Signature(s) Signed: 12/08/2021 12:56:59 PM By: Kalman Shan DO Entered By: Kalman Shan on 12/08/2021 12:48:30 [place]ester[place], Kennidi [name]. ([id number]) -------------------------------------------------------------------------------- Debridement Details Patient Name: [name] Date of Service: 12/08/2021 10:30 AM Medical Record Number: [id number] Patient Account Number: [id number] Date of Birth/Sex: 1976-03-06 (46 y.o. F) Treating RN: Levora Dredge Primary Care Provider: Raelene Bott Other Clinician: Referring Provider: Raelene Bott Treating Provider/Extender: Yaakov Guthrie in Treatment: 37 Debridement Performed for Wound #7 Left,Midline,Plantar Foot Assessment: Performed By: Physician Kalman Shan, MD Debridement Type: Debridement Severity of Tissue Pre Debridement: Fat layer exposed Level of Consciousness (Pre- Awake and Alert procedure): Pre-procedure Verification/Time Out Yes - 11:15 Taken: Total Area Debrided (L x W): 4.2 (cm) x 1.2 (cm) = 5.04 (cm) Tissue and other material Viable, Non-Viable, Callus, Subcutaneous debrided: Level: Skin/Subcutaneous Tissue Debridement Description: Excisional Instrument: Curette Bleeding: Minimum Hemostasis Achieved: Pressure Response to  Treatment: Procedure was tolerated well Level of Consciousness (Post- Awake and Alert procedure): Post Debridement Measurements of Total Wound Length: (cm) 4.2 Width: (cm) 1.2 Depth: (cm) 0.3 Volume: (cm) 1.188 Character of Wound/Ulcer Post Debridement: Stable Severity of Tissue Post Debridement: Fat layer exposed Post Procedure Diagnosis Same as Pre-procedure Electronic Signature(s) Signed: 12/08/2021 41:28:78 PM By: Kalman Shan DO Signed: 12/08/2021 5:33:03 PM By: Levora Dredge Entered By: Levora Dredge on 12/08/2021 11:17:27 Locken, Tanaisha [name]. ([id number]) -------------------------------------------------------------------------------- HPI Details Patient Name: [name] Date of Service: 12/08/2021 10:30 AM Medical Record Number: [id number] Patient Account Number: [id number] Date of Birth/Sex: 01-31-1976 (46 y.o. F) Treating RN: Levora Dredge Primary Care Provider: Raelene Bott Other Clinician: Referring Provider: Raelene Bott Treating Provider/Extender: Yaakov Guthrie in Treatment: 37 History of Present Illness HPI Description: 01/18/18-She is here for initial evaluation of the left great toe ulcer. She is [name] poor historian in regards to timeframe in detail. She states approximately 4 weeks ago she lacerated her toe on something in the house. She followed up with her primary care who placed her on Bactrim and ultimately [name] second dose of Bactrim prior to coming to wound clinic. She states she has been treating the toe with peroxide, Betadine and [name] Band-Aid. She did not check her blood sugar this morning but checked it yesterday morning it was 327; she is unaware of [name] recent A1c and there are no current records. She saw Dr. she would've orthopedics last week for an old injury to the left ankle, she states he did not see her toe, nor did she bring it to his attention. She smokes approximately 1 pack cigarettes [name] day. Her social situation is concerning, she  arrives this morning with her mother who appears extremely intoxicated/under the influence; her mother was asked to leave the room and be monitored by the patient's grandmother. The patient's aunt then accompanied the patient and the room throughout the rest of the appointment. We had [name] lengthy discussion regarding the deleterious effects of uncontrolled hyperglycemia and smoking as it relates  to wound healing and overall health. She was strongly encouraged to decrease her smoking and get her diabetes under better control. She states she is currently on [name] diet and has cut down her [facility] consumption. The left toe is erythematous, macerated and slightly edematous with malodor present. The edema in her left foot is below her baseline, there is no erythema streaking. We will treat her with Santyl, doxycycline; we have ordered and xray, culture and provided [name] Peg assist surgical shoe and cultured the wound. 01/25/18-She is here in follow-up evaluation for [name] left great toe ulcer and presents with an abscess to her suprapubic area. She states her blood sugars remain elevated, feeling "sick" and if levels are below 250, but she is trying. She has made no attempt to decrease her smoking stating that we "can't take away her food in her cigarettes". She has been compliant with offloading using the PEG assist you. She is using Santyl daily. the culture obtained last week grew staph aureus and Enterococcus faecalis; continues on the doxycycline and Augmentin was added on Monday. The suprapubic area has erythema, no femoral variation, purple discoloration, minimal induration, was accessed with [name] cotton tip applicator with sanguinopurulent drainage, this was cultured, I suspect the current antibiotic treatment will cover and we will not add anything to her current treatment plan. She was advised to go to urgent care or ER with any change in redness, induration or fever. 02/01/18-She is here in follow-up  evaluation for left great toe ulcers and [name] new abdominal abscess from last week. She was able to use packing until earlier this week, where she "forgot it was there". She states she was feeling ill with GI symptoms last week and was not taking her antibiotic. She states her glucose levels have been predominantly less than 200, with occasional levels between 200-250. She thinks this was contributing to her GI symptoms as they have resolved without intervention. There continues to be significant laceration to left toe, otherwise it clinically looks stable/improved. There is now less superficial opening to the lateral aspect of the great toe that was residual blister. We will transition to [facility] to all wounds, she will continue her Augmentin. If there is no change or deterioration next week for reculture. 02/08/18-She is here in follow-up evaluation for left great toe ulcer and abdominal ulcer. There is an improvement in both wounds. She has been wrapping her left toe with coban, not by our direction, which has created an area of discoloration to the medial aspect; she has been advised to NOT use coban secondary to her neuropathy. She states her glucose levels have been high over this last week ranging from 200-350, she continues to smoke. She admits to being less compliant with her offloading shoe. We will continue with same treatment plan and she will follow-up next week. 02/15/18-She is here in follow-up evaluation for left great toe ulcer and abdominal ulcer. The abdominal ulcer is epithelialized. The left great toe ulcer is improved and all injury from last week using the Coban wrap is resolved, the lateral ulcer is healed. She admits to noncompliance with wearing offloading shoe and admits to glucose levels being greater than 300 most of the week. She continues to smoke and expresses no desire to quit. There is one area medially that probes deeper than it has historically, erythema to the toe  and dorsal foot has consistently waxed and waned. There is no overt signs of cellulitis or infection but we will culture the  wound for any occult infection given the new area of depth and erythema. We will hold off on sensitivities for initiation of antibiotic therapy. 02/22/18-She is here in follow up evaluation for left great toe ulcer. There is overall significant improvement in both wound appearance, erythema and edema with changes made last week. She was not initiated on antibiotic therapy. Culture obtained last week showed oxacillin sensitive staph aureus, sensitive to clindamycin. Clindamycin has been called into the pharmacy but she has been instructed to hold off on initiation secondary to overall clinical improvement and her history of antibiotic intolerance. She has been instructed to contact the clinic with any noted changes/deterioration and the wound, erythema, edema and/or pain. She will follow-up next week. She continues to smoke and her glucose levels remain elevated >250; she admits to compliance with offloading shoe 03/01/18 on evaluation today patient appears to be doing fairly well in regard to her left first toe ulcer. She has been tolerating the dressing changes with the [facility] without complication and overall this has definitely showed signs of improvement according to records as well is what the patient tells me today. I'm very pleased in that regard. She is having no pain today 03/08/18 She is here for follow up evaluation of [name] left great toe ulcer. She remains non-compliant with glucose control and smoking cessation; glucose levels consistently >200. She states that she got new shoe inserts/peg assist. She admits to compliance with offloading. Since my last evaluation there is significant improvement. We will switch to prisma at this time and she will follow up next week. She is noted to be tachycardic at this appointment, heart rate 120s; she has [name] history of  heart rate 70-130 according to our records. She admits to extreme agitation r/t personal issues; she was advised to monitor her heartrate and contact her physician if it does not return to [name] more normal range (<100). She takes cardizem twice daily. 03/15/18-She is here in follow-up evaluation for left great toe ulcer. She remains noncompliant with glucose control and smoking cessation. She admits to compliance with wearing offloading shoe. The ulcer is improved/stable and we will continue with the same treatment plan and she will follow-up next week 03/22/18-She is here for evaluation for left great toe ulcer. There continues to be significant improvement despite recurrent hyperglycemia (over 500 yesterday) and she continues to smoke. She has been compliant with offloading and we will continue with same treatment plan and she will follow-up next week. 03/29/18-She is here for evaluation for left great toe ulcer. Despite continuing to smoke and uncontrolled diabetes she continues to improve. She is compliant with offloading shoe. We will continue with the same treatment plan and she will follow-up next week 04/05/18- She is here in follow up evaluation for [name] left great toe ulcer; she presents with small pustule to left fifth toe (resembles ant bite). She admits to compliance with wearing offloading shoe; continues to smoke or have uncontrolled blood glucose control. There is more callus than usual with evidence of bleeding; she denies known trauma. 04/12/18-She is here for evaluation of left great toe ulcer. Despite noncompliance with glycemic control and smoking she continues to make Kabler, Rhilynn [name]. ([id number]) improvement. She continues to wear offloading shoe. The pustule, that was identified last week, to the left fifth toe is resolved. She will follow-up in 2 weeks 05/03/18-she is seen in follow-up evaluation for [name] left great toe ulcer. She is compliant with offloading, otherwise noncompliant with  glycemic control  and smoking. She has plateaued and there is minimal improvement noted. We will transition to [facility], replaced the insert to her surgical shoe and she will follow-up in one week 05/10/18- She is here in follow up evaluation for [name] left great toe ulcer. It appears stable despite measurement change. We will continue with same treatment plan and follow up next week. 05/24/18-She is seen in follow-up evaluation for [name] left great toe ulcer. She remains compliant with offloading, has made significant improvement in her diet, decreasing the amount of sugar/soda. She said her recent A1c was 10.9 which is lower than. She did see [name] diabetic nutritionist/educator yesterday. She continues to smoke. We will continue with the same treatment plan and she'll follow-up next week. 05/31/18- She is seen in follow-up evaluation for left great toe ulcer. She continues to remain compliant with offloading, continues to make improvement in her diet, increasing her water and decreasing the amount of sugar/soda. She does continue to smoke with no desire to quit. We will apply Prisma to the depth and Hydrofera Blue over. We have not received insurance authorization for oasis. She will follow up next week. 06/07/18-She is seen in follow-up evaluation for left great toe ulcer. It has stalled according to today's measurements although base appears stable. She says she saw [name] diabetic educator yesterday; her average blood sugars are less than 300 which is an improvement for her. She continues to smoke and states "that's my next step" She continues with water over soda. We will order for xray, culture and reinstate ace wrap compression prior to placing apligraf for next week. She is voicing no complaints or concerns. Her dressing will change to iodoflex over the next week in preparation for apligraf. 06/14/18-She is seen in follow-up evaluation for left great toe ulcer. Plain film x-ray performed last week was  negative for osteomyelitis. Wound culture obtained last week grew strep B and OSSA; she is initiated on keflex and cefdinir today; there is erythema to the toe which could be from ace wrap compression, she has [name] history of wrapping too tight and has has been encouraged to maintain ace wraps that we place today. We will hold off on application of apligraf today, will apply next week after antibiotic therapy has been initiated. She admits today that she has resumed taking [name] shower with her foot/toe submerged in water, she has been reminded to keep foot/toe out of the bath water. She will be seen in follow up next week 06/21/18-she is seen in follow-up evaluation for left great toe ulcer. She is tolerating antibiotic therapy with no GI disturbance. The wound is stable. Apligraf was applied today. She has been decreasing her smoking, only had 4 cigarettes yesterday and 1 today. She continues being more compliant in diabetic diet. She will follow-up next week for evaluation of site, if stable will remove at 2 weeks. 06/28/18- She is here in follow up evalution. Apligraf was placed last week, she states the dressing fell off on Tuesday and she was dressing with hydrofera blue. She is healed and will be discharged from the clinic today. She has been instructed to continue with smoking cessation, continue monitoring glucose levels, offloading for an additional 4 weeks and continue with hydrofera blue for additional two weeks for any possible microscopic opening. Readmission: 08/07/18 on evaluation today patient presents for reevaluation concerning the ulcer of her right great toe. She was previously discharged on 06/28/18 healed. Nonetheless she states that this began to show signs of drainage she  subsequently went to her primary care provider. Subsequently an x-ray was performed on 08/01/18 which was negative. The patient was also placed on antibiotics at that time. Fortunately they should have been effective  for the infection. Nonetheless she's been experiencing some improvement but still has [name] lot of drainage coming from the wound itself. 08/14/18 on evaluation today patient's wound actually does show signs of improvement in regard to the erythema at this point. She has completed the antibiotics. With that being said we did discuss the possibility of placing her in [name] total contact cast as of today although I think that I may want to give this just [name] little bit more time to ensure nothing recurrence as far as her infection is concerned. I do not want to put in the cast and risk infection at that time if things are not completely resolved. With that being said she is gonna require some debridement today. 08/21/18 on evaluation today patient actually appears to be doing okay in regard to her toe ulcer. She's been tolerating the dressing changes without complication. With that being said it does appear that she is ready and in fact I think it's appropriate for [place] to go ahead and initiate the total contact cast today. Nonetheless she will require some sharp debridement to prepare the wound for application. Overall I feel like things have been progressing well but we do need to do something to get this to close more readily. 08/24/18 patient seen today for reevaluation after having had the total contact cast applied on Tuesday. She seems to have done very well the wound appears to be doing great and overall I'm pleased with the progress that she's made. There were no abnormal areas of rubbing from the cast on her lower extremity. 08/30/18 on evaluation today patient actually appears to be completely healed in regard to her plantar toe ulcer. She tells me at this point she's been having [name] lot of issues with the cast. She almost fell [name] couple of times the state shall the step of her dog [name] couple times as well. This is been [name] very frustrating process for her other nonetheless she has completely healed the wound  which is excellent news. Overall there does not appear to be the evidence of infection at this time which is great news. 09/11/18 evaluation today patient presents for follow-up concerning her great toe ulcer on the left which has unfortunately reopened since I last saw her which was only [name] couple of weeks ago. Unfortunately she was not able to get in to get the shoe and potentially the AFO that's gonna be necessary due to her left foot drop. She continues with offloading shoe but this is not enough to prevent her from reopening it appears. When we last had her in the total contact cast she did well from [name] healing standpoint but unfortunately the wound reopened as soon as she came out of the cast within just [name] couple of weeks. 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 This is also part of the reason why she falls on [name] regular basis. I just do not believe that is gonna be safe for [place] to reinitiate the total contact cast as last time we had this on she fell 3 times one  week which is definitely not normal for her. 09/18/18 upon evaluation today the patient actually appears to be doing about the same in regard to her toe ulcer. She did not contact [employer] as I asked her to even though I had given her the prescription. In fact she actually states that she has no idea where the prescription is. She did apparently call [employer] and they told her that all she needed to do was bring the prescription in order to be able to be seen and work on getting the AFO for her left foot. With all that being said she still does not have an appointment and I'm not sure were things stand that regard. I will give her [name] new prescription today in order to contact them to get this set up. 09/25/18 on evaluation today patient actually  appears to be doing about the same in regard to her toes ulcer. She does have [name] small areas which seems to have [name] lot of callous buildup around the edge of the wound which is going to need sharp debridement today. She still is waiting to be scheduled for evaluation with [employer] for possibility of an AFO. She states there supposed to call her tomorrow to get this set up. Unfortunately it does appear that her foot specifically the toe area is showing signs of erythema. There does not appear to be any systemic infection which is in these good news. TEGAN, BRITAIN [name]. ([id number]) 10/02/18 on evaluation today patient actually appears to be doing about the same in regard to her toe ulcer. This really has not done too well although it's not significantly larger it's also not significantly smaller. She has been tolerating the dressing changes without complication. She actually has her appointment with [facility] and [place] tomorrow to hopefully be measured for obtaining and AFO splint. I think this would be helpful preventing this from reoccurring. We had contemplated starting the cast this week although to be honest I am reluctant to do that as she's been having nausea, vomiting, and seizure activity over the past three days. She has [name] history of seizures and have been told is nothing that can be done for these. With that being said I do believe that along with the seizures have the nausea vomiting which upon further questioning doesn't seem to be the normal for her and makes me concerned for the possibility of infection or something else going on. I discussed this with the patient and her mother during the office visit today. I do not feel the wound is effective but maybe something else. The responses this was "this just happens to her at times and we don't know why". They did not seem to be interested in going to the hospital to have this checked out further. 10/09/18 on evaluation today patient presents  for follow-up concerning her ongoing toe ulcer. She has been tolerating the dressing changes without complication. Fortunately there does not appear to be any evidence of infection which is great news however I do think that the patient would benefit from going ahead for with the total contact cast. She's actually in [name] wheelchair today she tells me that she will use her walker if we initiate the cast. I was very specific about the fact that if we were gonna do the cast I wanted to make sure that she was using the walker in order to prevent any falls. She tells me she does not have stairs that she has to traverse on [name] regular basis  at her home. She has not had any seizures since last week again that something that happens to her often she tells me she did talk to [employer] and they said that it may take up to three weeks to get the brace approved for her. Hopefully that will not take that long but nonetheless in the meantime I do think the cast could be of benefit. 10/12/18 on evaluation today patient appears to be doing rather well in regard to her toe ulcer. It's just been [name] few days and already this is significantly improved both as far as overall appearance and size. Fortunately there's no sign of infection. She is here for her first obligatory cast change. 10/19/18 Seen today for follow up and management of left great toe ulcer. Wound continues to show improvement. Noted small open area with seroussang drainage with palpation. Denies any increased pain or recent fevers during visit. She will continue calcium alginate with offloading shoe. Denies any questions or concerns during visit. 10/26/18 on evaluation today patient appears to be doing about the same as when I last saw her in regard to her wound bed. Fortunately there does not appear to be any signs of infection. Unfortunately she continues to have [name] breakdown in regard to the toe region any time that she is not in the cast. It takes almost no  time at all for this to happen. Nonetheless she still has not heard anything from the brace being made by [employer] as to when exactly this will be available to her. Fortunately there is no signs of infection at this time. 10/30/18 on evaluation today patient presents for application of the total contact cast as we just received him this morning. Fortunately we are gonna be able to apply this to her today which is great news. She continues to have no significant pain which is good news. Overall I do feel like things have been improving while she was the cast is when she doesn't have [name] cast that things get worse. She still has not really heard anything from [place] regarding her brace. 11/02/18 upon evaluation today patient's wound already appears to be doing significantly better which is good news. Fortunately there does not appear to be any signs of infection also good news. Overall I do think the total contact cast as before is helping to heal this area unfortunately it's just not gonna likely keep the area closed and healed without her getting her brace at least. Again the foot drop is [name] significant issue for her. 11/09/18 on evaluation today patient appears to be doing excellent in regard to her toe ulcer which in fact is completely healed. Fortunately we finally got the situation squared away with the paperwork which was needed to proceed with getting her brace approved by Medicaid. I have filled that out unfortunately that information has been sent to the orthopedic office that I worked at 2 1/2 years ago and not tired Current wound care measures. Fortunately she seems to be doing very well at this time. 11/23/18 on evaluation today patient appears to be doing More Poorly Compared to Last Time I Saw Her. At [facility] She Had Completely Healed. Currently she is continuing to have issues with reopening. She states that she just found out that the brace was approved through Medicaid now she just has to  go get measured in order to have this fitted for her and then made. Subsequently she does not have an appointment for this yet that is going  to complicate things we obviously cannot put her back in the cast if we do not have everything measured because they're not gonna be able to measure her foot while she is in the cast. 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 Obviously this is unfortunate and does have me somewhat worried as well. 11/30/18 on evaluation today patient's toe ulcer actually appears to be doing fairly well. The good news is she will be getting her brace in the shoes next week on Wednesday. Hopefully we will be able to get this to heal without having to go back in the cast however she may need the cast in order to get the wound completely heal and then go from there. Fortunately there's no signs of infection at this time. 12/07/18 on evaluation today patient fortunately did receive her brace and she states she could tell this definitely makes her walk better. With that being said she's been having issues with her toe where she noticed yesterday there was [name] lot of tissue that was loosing off this appears to be much larger than what it was previous. She also states that her leg has been read putting much across the top of her foot just about the ankle although this seems to be receiving somewhat. The total area is still red and appears to be someone infected as best I can tell. She is previously taken Bactrim and that may be [name] good option for her today as well. We are gonna see what I wound culture shows as well and I think that this is definitely appropriate. With that being said outside of the culture I still need to initiate something in the interim and that's what I'm gonna go ahead and select Bactrim is [name] good option for her. 12/14/18 on evaluation today patient appears to be doing  better in regard to her left great toe ulcer as compared to last week's evaluation. There's still some erythema although this is significantly improved which is excellent news. Overall I do believe that she is making good progress is still gonna take some time before she is where I would like her to be from the standpoint of being able to place her back into the total contact cast. Hopefully we will be where we need to be by next week. 12/21/18 on evaluation today patient actually appears to be doing poorly in regard to her toe ulcer. She's been tolerating the dressing changes without complication. Fortunately there's no signs of systemic infection although she does have [name] lot of drainage from the toe ulcer and this does seem to be causing some issues at this point. She does have erythema on the distal portion of her toe that appears to be likely cellulitis. 12/28/18 on evaluation today patient actually appears to be doing [name] little better in my pinion in regard to her toe ulcer. With that being said she still does have some evidence of infection at this time and for her culture she had both E. coli as well as enterococcus as organisms noted on evaluation. For that reason I think that though the Keflex likely has treated the E. coli rather well this has really done nothing for the enterococcus. We are going to have to initiate treatment for this specifically. NUBIA, ZIESMER [name]. ([id number]) 01/04/19 on evaluation today patient's toe actually appears to be doing better from the standpoint of infection. She  currently would like to see about putting the cash back on I think that this is appropriate as long as she takes care of it and keeps it from getting wet. She is gonna have some drainage we can definitely pass this up with Drawtex and alginate to try to prevent as much drainage as possible from causing the problems. With that being said I do want to at least try her with the cast between now and Tuesday. If  there any issues we can't continue to use it then I will discontinue the use of the cast at that point. 01/08/19 on evaluation today patient actually appears to be doing very well as far as her foot ulcer specifically the great toe on the left is concerned. She did have an area of rubbing on the medial aspect of her left ankle which again is from the cast. Fortunately there's no signs of infection at this point in this appears to be [name] very slight skin breakdown. The patient tells me she felt it rubbing but didn't think it was that bad. Fortunately there is no signs of active infection at this time which is good news. No fevers, chills, nausea, or vomiting noted at this time. 01/15/19 on evaluation today patient actually appears to be doing well in regard to her toe ulcer. Again as previous she seems to do well and she has the cast on which indicates to me that during the time she doesn't have [name] cast on she's putting way too much pressure on this region. Obviously I think that's gonna be an issue as with the current national emergency concerning the Covid-19 Virus it has been recommended that we discontinue the use of total contact casting by the chief medical officer of our company, Dr. Simona Huh. The reasoning is that if [name] patient becomes sick and cannot come into have the cast removed they could not just leave this on for an additional two weeks. Obviously the hospitals also do not want to receive patient's who are sick into the emergency department to potentially contaminate the region and spread the Covid-19 Virus among other sick individuals within the hospital system. Therefore at this point we are suspending the use of total contact cast until the current emergency subsides. This was all discussed with the patient today as well. 01/22/19 on evaluation today patient's wound on her left great toe appears to be doing slightly worse than previously noted last week. She tells me that she has been on this  quite [name] bit in fact she tells me she's been awake for 38 straight hours. This is due to the fact that she's having to care for grandparents because nobody else will. She has been taking care of them for five the last seven days since I've seen her they both have dementia his is from [name] stroke and her grandmother's was progressive. Nonetheless she states even her mom who knows her condition and situation has only help two of those days to take care of them she's been taking care of the rest. Fortunately there does not appear to be any signs of active infection in regard to her toe at this point although obviously it doesn't look as good as it did previous. I think this is directly related to her not taking off the pressure and friction by way of taking things easy. Though I completely understand what's going on. 01/29/19 on evaluation today patient's tools are actually appears to be showing some signs of improvement today compared to  last week's evaluation as far as not necessarily the overall size of the wound but the fact that she has some new skin growth in between the two ends of the wound opening. Overall I feel like she has done well she states that she had [name] family member give her what sounds to be [name] CAM walker boot which has been helpful as well. 02/05/19 on evaluation today patient's wound bed actually appears to be doing significantly better in regard to her overall appearance of the size of the wound. With that being said she is still having an issue with offloading efficiently enough to get this to close. Apparently there is some signs of infection at this point as well unfortunately. Previously she's done well of Augmentin I really do not see anything that needs to be culture currently but there theme and cellulitis of the foot that I'm seeing I'm gonna go ahead and place her on an antibiotic today to try to help clear this up. 02/12/2019 on evaluation today patient actually appears to be doing  poorly in regard to her overall wound status. She tells me she has been using her offloading shoe but actually comes in today wearing her tennis shoe with the AFO brace. Again as I previously discussed with her this is really not sufficient to allow the area to heal appropriately. Nonetheless she continues to be somewhat noncompliant and I do wonder based on what she has told my nurse in the past as to whether or not [name] good portion of this noncompliance may be recreational drug and alcohol related. She has had [name] history of heroin overdose and this was fairly recently in the past couple of months that have been seeing her. Nonetheless overall I feel like her wound looks significantly worse today compared to what it was previous. She still has significant erythema despite the Augmentin I am not sure that this is an appropriate medication for her infection I am also concerned that the infection may have gone down into her bone. 02/19/19 on evaluation today patient actually appears to be doing about the same in regard to her toe ulcer. Unfortunately she continues to show signs of bone exposure and infection at this point. There does not appear to be any evidence of worsening of the infection but I'm also not really sure that it's getting significantly better. She is on the Augmentin which should be sufficient for the Staphylococcus aureus infection that she has at this point. With that being said she may need IV antibiotics to more appropriately treat this. We did have [name] discussion today about hyperbaric option therapy. 02/28/19 on evaluation today patient actually appears to be doing much worse in regard to the wound on her left great toe as compared to even my previous evaluation last week. Unfortunately this seems to be training in [name] pretty poor direction. Her toe was actually now starting to angle laterally and I can actually see the entire joint area of the proximal portion of the digit where is the  distal portion of the digit again is no longer even in contact with the joint line. Unfortunately there's [name] lot more necrotic tissue around the edge and the toe appears to be showing signs of becoming gangrenous in my pinion. I'm very concerned about were things stand at this point. She did see infectious disease and they are planning to send in [name] prescription for Sivextro for her and apparently this has been approved. With that being said I don't think  she should avoid taking this but at the same time I'm not sure that it's gonna be sufficient to save her toe at this point. She tells me that she still having to care for grandparents which I think is putting quite [name] bit of strain on her foot and specifically the total area and has caused this to break down even to [name] greater degree than would've otherwise been expected. 03/05/19 on evaluation today patient actually appears to be doing quite well in regard to her toe all things considering. She still has bone exposed but there appears to be much less your thing on overall the appearance of the wound and the toe itself is dramatically improved. She still does have some issues currently obviously with infection she did see vascular as well and there concerned that her blood flow to the toad. For that reason they are setting up for an angiogram next week. 03/14/19 on evaluation today patient appears to be doing very poor in regard to her toe and specifically in regard to the ulceration and the fact that she's starting to notice the toe was leaning even more towards the lateral aspect and the complete joint is visible on the proximal aspect of the joint. Nonetheless she's also noted [name] significant odor and the tip of the toe is turning more dark and necrotic appearing. Overall I think she is getting worse not better as far as this is concerned. For that reason I am recommending at this point that she likely needs to be seen for  likely amputation. READMISSION 03/19/2021 This is [name] patient that we cared for in this clinic for [name] prolonged period of time in 2019 and 2020 with [name] left foot and left first toe wound. 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 ([id number]) 04/09/20 by Dr. Luana Shu. In December 2021 she had an ulcer on her right great toe as well as the fourth and fifth toes. 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 She also had an angiogram at that time and underwent angioplasty of the right anterior tibial artery. In any case she claims that the wound on the right foot is closed I did not look at this today which was probably an oversight although I think that should be done next week. 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 According to Dr. Deborra Medina last review that she was out of the area being cared for by another physician but recently came back to his attention. 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 [name] culture of this area showed E. coli apparently before she came back to see Dr. Luana Shu she was supposed to be receiving antibiotics but she did not really take them. Nor is she offloading this area at all. Finally her last hemoglobin A1c listed in epic was in March 2022 at 14.1 she says things are [name] lot better since then although I am not sure. 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 She had this wound at the time. During this admission she had E. coli sepsis [name] TEE was negative. Past medical history is extensive and includes type 2 diabetes with peripheral neuropathy cardiomyopathy with an ejection fraction of 33%, hypertension, hyperlipidemia chronic renal failure stage III history of  substance abuse with cocaine  although she claims to be clean now verified by her mother. She is still [name] heavy cigarette smoker. She has [name] history of bipolar disorder seizure disorder ABI in our clinic was 1.05 6/1; left midfoot in the setting of [name] TMA done previously. Round circular wound with [name] "knuckle" of protruding tissue. The problem is that the knuckle was not attached to any of the surrounding granulation and this probed proximally widely I removed [name] large portion of this tissue. This wound goes with considerable undermining laterally. I do not feel any bone there was no purulence but this is [name] deep wound. 6/8; in spite of the debridement I did last week. She arrives with [name] wound looking exactly the same. [name] protruding "knuckle" of tissue nonadherent to most of the surrounding tissue. There is considerable depth around this from 6-12 o'clock at 2.7 cm and undermining of 1 cm. This does not look overtly infected and the x-ray I did last week was negative for any osseous abnormalities. We have been using silver collagen 6/15; deep tissue culture I did last week showed moderate staph aureus and moderate Pseudomonas. This will definitely require prolonged antibiotic therapy. The pathology on the protuberant area was negative for malignancy fungus etc. 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 We have been using silver collagen. I am going to be prescribing Levaquin for 2 weeks. Her CT scan of the foot is down for 7/5 6/22; CT scan of the foot on 7 5. She says she has hardware in the left leg from her previous fracture. She is on the Levaquin for the deep tissue culture I did that showed methicillin sensitive staph aureus and Pseudomonas. I gave her [name] 2-week supply and she will have another week. She arrives in clinic today with the same protuberant tissue however this is nonadherent to the tissue surrounding it. I am really at [name] loss to explain this unless there is  underlying deep tissue infection 6/29; patient presents for 1 week follow-up. She has been using collagen to the wound bed. She reports taking her antibiotics as prescribed.She has no complaints or issues today. She denies signs of infection. 7/6; patient presents for one week followup. She has been using collagen to the wound bed. She states she is taking Levaquin however at times she is not able to keep it down. She denies signs of infection. 7/13; patient presents for 1 week follow-up. She has been using silver alginate to the wound bed. She still has nausea when taking her antibiotics. She denies signs of infection. 7/20; patient presents for 1 week follow-up. She has been using silver alginate with gentamicin cream to the wound bed. She denies any issues and has no complaints today. She denies signs of infection. 7/27; patient presents for 1 week follow-up. She continues to use silver alginate with gentamicin cream to the wound bed. She reports starting her antibiotics. She has no issues or complaints. Overall she reports stability to the wound. 8/3; patient presents for 1 week follow-up. She has been using silver alginate with gentamicin cream to the wound bed. She reports completing all antibiotics. She has no issues or complaints today. She denies signs of infection. 8/17; patient presents for 2-week follow-up. He is to use silver alginate to the wound bed. She has no issues or complaints today. She denies signs of infection. She reports her pain has improved in her foot since last clinic visit 8/24; patient  presents for 1 week follow-up. She continues to use silver alginate to the wound bed. She has no issues or complaints. She denies signs of infection. Pain is stable. 9/7; patient presents for follow-up. She missed her last week appointment due to feeling ill. She continues to use silver alginate. She has [name] new wound to the right lower extremity that is covered in eschar. She states It  occurred over the past week and has no idea how it started. She currently denies signs of infection. 9/14; patient presents for follow-up. To the left foot wound she has been using gentamicin cream and silver alginate. To the right lower extremity wound she has been keeping this covered and has not obtain Santyl. 9/21; patient presents for follow-up. She reports using gentamicin cream and silver alginate to the left foot and Santyl to the right lower extremity wound. She has no issues or complaints today. She denies signs of infection. 9/28; patient presents for follow-up. She reports [name] new wound to her right heel. She states this occurred [name] few days ago and is progressively gotten worse. She has been trying to clean the area with [name] Q-tip and Santyl. She reports stability in the other 2 wounds. She has been using gentamicin cream and silver alginate to the left foot and Santyl to the right lower extremity wound. 10/12; patient presents for follow-up. She reports improvement to the wound beds. She is seeing vein and vascular to discuss the potential of [name] left BKA. She states they are going to do an arteriogram. She continues to use silver alginate with dressing changes to her wounds. 11/2; patient presents for follow-up. She states she has not been doing dressing changes to the wound beds. She states she is not able to offload the areas. She reports chronic pain to her left foot wound. 11/9; patient presents for follow-up. She came in with only socks on. She states she forgot to put on shoes. It is unclear if she is doing any dressing changes. She currently denies systemic signs of infection. 11/16; patient presents for follow-up. She came again only with socks on. She states she does not wear shoes ever. It is unclear if she does dressing changes. She currently denies systemic signs of infection. 11/23; patient presents for follow-up. She wore her shoes today. It still unclear exactly what dressing  she is using for each wound but she did states she obtained Dakin's solution and has been using this to the left foot wound. She currently denies signs of infection. 11/30; patient presents for follow-up. She has no issues or complaints today. She currently denies signs of infection. 12/7; patient presents for follow-up. She has no issues or complaints today. She has been using Hydrofera Blue to the right heel wound and Dakin solution to the left foot wound. Her right anterior leg wound is healed. She currently denies signs of infection. [name], PODOLSKI [name]. ([id number]) 12/14; patient presents for follow-up. She has been using Hydrofera Blue to the right heel and Dakin's to the left foot wounds. She has no issues or complaints today. She denies signs of infection. 12/21; patient presents for follow-up. She reports using Hydrofera Blue to the right heel and Dakin's to the left foot wound. She denies signs of infection. 12/28; patient presents for follow-up. She continues to use Dakin's to the left foot wound and Hydrofera Blue to the right heel wound. She denies signs of infection. 1/4; patient presents for follow-up. She has no issues or complaints today. She denies  signs of infection. 1/11; patient presents for follow-up. It is unclear if she has been dressing these wounds over the past week. She currently denies signs of infection. 1/18; patient presents for follow-up. She states she has been using Dakin's wet-to-dry dressings to the left foot. She has been using Hydrofera Blue to the right foot foot wound. She states that the anterior right leg wound has reopened and draining serous fluid. She denies signs of infection. 1/25; patient presents for follow-up. She has no issues or complaints today. 2/1; patient presents for follow-up. She has no issues or complaints today. She denies signs of infection. 2/8; patient presents for follow-up. She has lost her surgical shoes. She did not have [name] dressing  to the right heel wound. She currently denies signs of infection. 2/15; patient presents for follow-up. She reports more pain to the right heel today. She denies purulent drainage Or fever/chills Electronic Signature(s) Signed: 12/08/2021 12:56:59 PM By: Kalman Shan DO Entered By: Kalman Shan on 12/08/2021 12:49:23 [name] ([id number]) -------------------------------------------------------------------------------- Physical Exam Details Patient Name: [name] Date of Service: 12/08/2021 10:30 AM Medical Record Number: [id number] Patient Account Number: [id number] Date of Birth/Sex: 07-05-1976 (46 y.o. F) Treating RN: Levora Dredge Primary Care Provider: Raelene Bott Other Clinician: Referring Provider: Raelene Bott Treating Provider/Extender: Yaakov Guthrie in Treatment: 63 Constitutional . Cardiovascular . Psychiatric . Notes Left foot (Transmetatarsal amputation): Wound to the plantar aspect that has granulation tissue and scant nonviable tissue And circumferential Callus.. Right foot: To the heel there is an open wound with Granulation tissue , And nonviable tissue with Circumferential callus. Tenderness on exam. Right lower extremity: Thin layer of skin over previous wound site. Electronic Signature(s) Signed: 12/08/2021 12:56:59 PM By: Kalman Shan DO Entered By: Kalman Shan on 12/08/2021 12:50:36 [name] ([id number]) -------------------------------------------------------------------------------- Physician Orders Details Patient Name: [name] Date of Service: 12/08/2021 10:30 AM Medical Record Number: [id number] Patient Account Number: [id number] Date of Birth/Sex: 12/03/1975 (46 y.o. F) Treating RN: Levora Dredge Primary Care Provider: Raelene Bott Other Clinician: Referring Provider: Raelene Bott Treating Provider/Extender: Yaakov Guthrie in Treatment: 50 Verbal / Phone Orders: No Diagnosis  Coding Follow-up Appointments o Return Appointment in 1 week. Bathing/ Shower/ Hygiene o Wash wounds with antibacterial soap and water. Off-Loading Left Lower Extremity o Open toe surgical shoe with peg assist. o Turn and reposition every 2 hours o Other: - peg shoe right, off-loader left, pt advised to stay off feet as much as possible Medications-Please add to medication list. o P.O. Antibiotics o Other: - per MD [place] ok to leave right lower leg dressing on for 3-4 days and then continue daily Wound Treatment Radiology o Computed Tomography (CT) Scan , Lower extremity without contrast - CT scan without contrast of right calcaneus Patient Medications Allergies: erythromycin base, Neosporin (neo-bac-polym) Notifications Medication Indication Start End clindamycin HCl 12/08/2021 DOSE 1 - oral 300 mg capsule - 1 capsule oral q6h x 10 days Electronic Signature(s) Signed: 12/08/2021 12:56:59 PM By: Kalman Shan DO Previous Signature: 12/08/2021 11:24:55 AM Version By: Kalman Shan DO Entered By: Kalman Shan on 12/08/2021 12:55:56 Kroh, Adasyn [name]. ([id number]) -------------------------------------------------------------------------------- Problem List Details Patient Name: [name] Date of Service: 12/08/2021 10:30 AM Medical Record Number: [id number] Patient Account Number: [id number] Date of Birth/Sex: Oct 02, 1976 (46 y.o. 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 03/19/2021 No Yes severity L97.512 Non-pressure chronic ulcer of other part of right foot with fat layer 09/15/2021 No Yes exposed L97.812 Non-pressure chronic ulcer of other  part of right lower leg with fat layer 09/15/2021 No Yes exposed E11.621 Type 2 diabetes mellitus with foot ulcer 03/19/2021 No Yes E11.42 Type 2 diabetes mellitus with diabetic polyneuropathy 03/19/2021 No Yes L97.811 Non-pressure chronic ulcer of other part of right lower leg limited to 11/10/2021 No Yes breakdown of skin Inactive Problems ICD-10 Code Description Active Date Inactive Date S81.801A Unspecified open wound, right lower leg, initial encounter 06/30/2021 06/30/2021 Resolved Problems Electronic Signature(s) Signed: 12/08/2021 12:56:59 PM By: Kalman Shan DO Previous Signature: 12/08/2021 11:51:31 AM Version By: Levora Dredge Entered By: Kalman Shan on 12/08/2021 12:48:21 [place], Brieonna [name]. ([id number]) -------------------------------------------------------------------------------- Progress Note Details Patient Name: [name] Date of Service: 12/08/2021 10:30 AM Medical Record Number: [id number] Patient Account Number: [id number] Date of Birth/Sex: Jan 28, 1976 (46 y.o. F) Treating RN: Levora Dredge Primary Care Provider: Raelene Bott Other Clinician: Referring Provider: Raelene Bott Treating Provider/Extender: Yaakov Guthrie in Treatment: 37 Subjective Chief Complaint Information obtained from Patient 03/19/2021; patient referred by Dr. Luana Shu who has been looking after her left foot for quite [name] period of time for review of [name] nonhealing area in the left midfoot History of Present Illness (HPI) 01/18/18-She is here for initial evaluation of the left great toe ulcer. She is [name] poor historian in regards to timeframe in detail. She states approximately 4 weeks ago she lacerated her toe on something in the house. She followed up with her primary care who placed her on Bactrim and ultimately [name] second dose of Bactrim prior to coming to wound clinic. She states she has been treating the toe with peroxide, Betadine and [name] Band-Aid. She did not check her blood  sugar this morning but checked it yesterday morning it was 327; she is unaware of [name] recent A1c and there are no current records. She saw Dr. she would've orthopedics last week for an old injury to the left ankle, she states he did not see her toe, nor did she bring it to his attention. She smokes approximately 1 pack cigarettes [name] day. Her social situation is concerning, she arrives this morning with her mother who appears extremely intoxicated/under the influence; her mother was asked to leave the room and be monitored by the patient's grandmother. The patient's aunt then accompanied the patient and the room throughout the rest of the appointment. We had [name] lengthy discussion regarding the deleterious effects of uncontrolled hyperglycemia and smoking as it relates to wound healing and overall health. She was strongly encouraged to decrease her smoking and get her diabetes under better control. She states she is currently on [name] diet and has cut down her [facility] consumption. The left toe is erythematous, macerated and slightly edematous with malodor present. The edema in her left foot is below her baseline, there is no erythema streaking. We will treat her with Santyl, doxycycline; we have ordered and xray, culture and provided [name] Peg assist surgical shoe and cultured the wound. 01/25/18-She is here in follow-up evaluation for [name] left great toe ulcer and presents with an abscess to her suprapubic area. She states her blood sugars remain elevated, feeling "sick" and if levels are below 250, but she is trying. She has made no attempt to decrease her smoking stating that we "can't take away  her food in her cigarettes". She has been compliant with offloading using the PEG assist you. She is using Santyl daily. the culture obtained last week grew staph aureus and Enterococcus faecalis; continues on the doxycycline and Augmentin was added on Monday. The suprapubic area has erythema, no femoral variation, purple  discoloration, minimal induration, was accessed with [name] cotton tip applicator with sanguinopurulent drainage, this was cultured, I suspect the current antibiotic treatment will cover and we will not add anything to her current treatment plan. She was advised to go to urgent care or ER with any change in redness, induration or fever. 02/01/18-She is here in follow-up evaluation for left great toe ulcers and [name] new abdominal abscess from last week. She was able to use packing until earlier this week, where she "forgot it was there". She states she was feeling ill with GI symptoms last week and was not taking her antibiotic. She states her glucose levels have been predominantly less than 200, with occasional levels between 200-250. She thinks this was contributing to her GI symptoms as they have resolved without intervention. There continues to be significant laceration to left toe, otherwise it clinically looks stable/improved. There is now less superficial opening to the lateral aspect of the great toe that was residual blister. We will transition to [facility] to all wounds, she will continue her Augmentin. If there is no change or deterioration next week for reculture. 02/08/18-She is here in follow-up evaluation for left great toe ulcer and abdominal ulcer. There is an improvement in both wounds. She has been wrapping her left toe with coban, not by our direction, which has created an area of discoloration to the medial aspect; she has been advised to NOT use coban secondary to her neuropathy. She states her glucose levels have been high over this last week ranging from 200-350, she continues to smoke. She admits to being less compliant with her offloading shoe. We will continue with same treatment plan and she will follow-up next week. 02/15/18-She is here in follow-up evaluation for left great toe ulcer and abdominal ulcer. The abdominal ulcer is epithelialized. The left great toe ulcer is improved  and all injury from last week using the Coban wrap is resolved, the lateral ulcer is healed. She admits to noncompliance with wearing offloading shoe and admits to glucose levels being greater than 300 most of the week. She continues to smoke and expresses no desire to quit. There is one area medially that probes deeper than it has historically, erythema to the toe and dorsal foot has consistently waxed and waned. There is no overt signs of cellulitis or infection but we will culture the wound for any occult infection given the new area of depth and erythema. We will hold off on sensitivities for initiation of antibiotic therapy. 02/22/18-She is here in follow up evaluation for left great toe ulcer. There is overall significant improvement in both wound appearance, erythema and edema with changes made last week. She was not initiated on antibiotic therapy. Culture obtained last week showed oxacillin sensitive staph aureus, sensitive to clindamycin. Clindamycin has been called into the pharmacy but she has been instructed to hold off on initiation secondary to overall clinical improvement and her history of antibiotic intolerance. She has been instructed to contact the clinic with any noted changes/deterioration and the wound, erythema, edema and/or pain. She will follow-up next week. She continues to smoke and her glucose levels remain elevated >250; she admits to compliance with offloading shoe 03/01/18  on evaluation today patient appears to be doing fairly well in regard to her left first toe ulcer. She has been tolerating the dressing changes with the [facility] Dressing without complication and overall this has definitely showed signs of improvement according to records as well is what the patient tells me today. I'm very pleased in that regard. She is having no pain today 03/08/18 She is here for follow up evaluation of [name] left great toe ulcer. She remains non-compliant with glucose control and  smoking cessation; glucose levels consistently >200. She states that she got new shoe inserts/peg assist. She admits to compliance with offloading. Since my last evaluation there is significant improvement. We will switch to prisma at this time and she will follow up next week. She is noted to be tachycardic at this appointment, heart rate 120s; she has [name] history of heart rate 70-130 according to our records. She admits to extreme agitation r/t personal issues; she was advised to monitor her heartrate and contact her physician if it does not return to [name] more normal range (<100). She takes cardizem twice daily. 03/15/18-She is here in follow-up evaluation for left great toe ulcer. She remains noncompliant with glucose control and smoking cessation. She admits to compliance with wearing offloading shoe. The ulcer is improved/stable and we will continue with the same treatment plan and she will follow-up next week 03/22/18-She is here for evaluation for left great toe ulcer. There continues to be significant improvement despite recurrent hyperglycemia (over 500 yesterday) and she continues to smoke. She has been compliant with offloading and we will continue with same treatment plan and she will follow-up next week. [name], [name] ([id number]) 03/29/18-She is here for evaluation for left great toe ulcer. Despite continuing to smoke and uncontrolled diabetes she continues to improve. She is compliant with offloading shoe. We will continue with the same treatment plan and she will follow-up next week 04/05/18- She is here in follow up evaluation for [name] left great toe ulcer; she presents with small pustule to left fifth toe (resembles ant bite). She admits to compliance with wearing offloading shoe; continues to smoke or have uncontrolled blood glucose control. There is more callus than usual with evidence of bleeding; she denies known trauma. 04/12/18-She is here for evaluation of left great toe ulcer.  Despite noncompliance with glycemic control and smoking she continues to make improvement. She continues to wear offloading shoe. The pustule, that was identified last week, to the left fifth toe is resolved. She will follow-up in 2 weeks 05/03/18-she is seen in follow-up evaluation for [name] left great toe ulcer. She is compliant with offloading, otherwise noncompliant with glycemic control and smoking. She has plateaued and there is minimal improvement noted. We will transition to [facility], replaced the insert to her surgical shoe and she will follow-up in one week 05/10/18- She is here in follow up evaluation for [name] left great toe ulcer. It appears stable despite measurement change. We will continue with same treatment plan and follow up next week. 05/24/18-She is seen in follow-up evaluation for [name] left great toe ulcer. She remains compliant with offloading, has made significant improvement in her diet, decreasing the amount of sugar/soda. She said her recent A1c was 10.9 which is lower than. She did see [name] diabetic nutritionist/educator yesterday. She continues to smoke. We will continue with the same treatment plan and she'll follow-up next week. 05/31/18- She is seen in follow-up evaluation for left great toe ulcer. She continues to remain  compliant with offloading, continues to make improvement in her diet, increasing her water and decreasing the amount of sugar/soda. She does continue to smoke with no desire to quit. We will apply Prisma to the depth and Hydrofera Blue over. We have not received insurance authorization for oasis. She will follow up next week. 06/07/18-She is seen in follow-up evaluation for left great toe ulcer. It has stalled according to today's measurements although base appears stable. She says she saw [name] diabetic educator yesterday; her average blood sugars are less than 300 which is an improvement for her. She continues to smoke and states "that's my next step" She continues  with water over soda. We will order for xray, culture and reinstate ace wrap compression prior to placing apligraf for next week. She is voicing no complaints or concerns. Her dressing will change to iodoflex over the next week in preparation for apligraf. 06/14/18-She is seen in follow-up evaluation for left great toe ulcer. Plain film x-ray performed last week was negative for osteomyelitis. Wound culture obtained last week grew strep B and OSSA; she is initiated on keflex and cefdinir today; there is erythema to the toe which could be from ace wrap compression, she has [name] history of wrapping too tight and has has been encouraged to maintain ace wraps that we place today. We will hold off on application of apligraf today, will apply next week after antibiotic therapy has been initiated. She admits today that she has resumed taking [name] shower with her foot/toe submerged in water, she has been reminded to keep foot/toe out of the bath water. She will be seen in follow up next week 06/21/18-she is seen in follow-up evaluation for left great toe ulcer. She is tolerating antibiotic therapy with no GI disturbance. The wound is stable. Apligraf was applied today. She has been decreasing her smoking, only had 4 cigarettes yesterday and 1 today. She continues being more compliant in diabetic diet. She will follow-up next week for evaluation of site, if stable will remove at 2 weeks. 06/28/18- She is here in follow up evalution. Apligraf was placed last week, she states the dressing fell off on Tuesday and she was dressing with hydrofera blue. She is healed and will be discharged from the clinic today. She has been instructed to continue with smoking cessation, continue monitoring glucose levels, offloading for an additional 4 weeks and continue with hydrofera blue for additional two weeks for any possible microscopic opening. Readmission: 08/07/18 on evaluation today patient presents for reevaluation concerning  the ulcer of her right great toe. She was previously discharged on 06/28/18 healed. Nonetheless she states that this began to show signs of drainage she subsequently went to her primary care provider. Subsequently an x-ray was performed on 08/01/18 which was negative. The patient was also placed on antibiotics at that time. Fortunately they should have been effective for the infection. Nonetheless she's been experiencing some improvement but still has [name] lot of drainage coming from the wound itself. 08/14/18 on evaluation today patient's wound actually does show signs of improvement in regard to the erythema at this point. She has completed the antibiotics. With that being said we did discuss the possibility of placing her in [name] total contact cast as of today although I think that I may want to give this just [name] little bit more time to ensure nothing recurrence as far as her infection is concerned. I do not want to put in the cast and risk infection at that time if  things are not completely resolved. With that being said she is gonna require some debridement today. 08/21/18 on evaluation today patient actually appears to be doing okay in regard to her toe ulcer. She's been tolerating the dressing changes without complication. With that being said it does appear that she is ready and in fact I think it's appropriate for [place] to go ahead and initiate the total contact cast today. Nonetheless she will require some sharp debridement to prepare the wound for application. Overall I feel like things have been progressing well but we do need to do something to get this to close more readily. 08/24/18 patient seen today for reevaluation after having had the total contact cast applied on Tuesday. She seems to have done very well the wound appears to be doing great and overall I'm pleased with the progress that she's made. There were no abnormal areas of rubbing from the cast on her lower extremity. 08/30/18 on  evaluation today patient actually appears to be completely healed in regard to her plantar toe ulcer. She tells me at this point she's been having [name] lot of issues with the cast. She almost fell [name] couple of times the state shall the step of her dog [name] couple times as well. This is been [name] very frustrating process for her other nonetheless she has completely healed the wound which is excellent news. Overall there does not appear to be the evidence of infection at this time which is great news. 09/11/18 evaluation today patient presents for follow-up concerning her great toe ulcer on the left which has unfortunately reopened since I last saw her which was only [name] couple of weeks ago. Unfortunately she was not able to get in to get the shoe and potentially the AFO that's gonna be necessary due to her left foot drop. She continues with offloading shoe but this is not enough to prevent her from reopening it appears. When we last had her in the total contact cast she did well from [name] healing standpoint but unfortunately the wound reopened as soon as she came out of the cast within just [name] couple of weeks. 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 This is also part of the reason why she falls on [name] regular basis. I just do not believe that is gonna be safe for [place] to reinitiate the total contact cast as last time we had this on she fell 3 times one week which is definitely not normal for her. 09/18/18 upon evaluation today the patient actually appears to be doing about the same in regard to her toe ulcer. She did not contact [facility] as I asked her to even though I had given her the prescription. In fact she actually states that she has no idea where the prescription is. She did apparently call [facility] and they told her  that all she needed to do was bring the prescription in order to be able to be seen and work on getting the AFO for her left foot. With all that being said she still does not have an appointment and I'm not sure were things stand that regard. I will give her [name] new prescription today in order to contact them to get this set up. YAREL, KILCREASE ([id number]) 09/25/18 on  evaluation today patient actually appears to be doing about the same in regard to her toes ulcer. She does have [name] small areas which seems to have [name] lot of callous buildup around the edge of the wound which is going to need sharp debridement today. She still is waiting to be scheduled for evaluation with [facility] for possibility of an AFO. She states there supposed to call her tomorrow to get this set up. Unfortunately it does appear that her foot specifically the toe area is showing signs of erythema. There does not appear to be any systemic infection which is in these good news. 10/02/18 on evaluation today patient actually appears to be doing about the same in regard to her toe ulcer. This really has not done too well although it's not significantly larger it's also not significantly smaller. She has been tolerating the dressing changes without complication. She actually has her appointment with [facility] and [facility] tomorrow to hopefully be measured for obtaining and AFO splint. I think this would be helpful preventing this from reoccurring. We had contemplated starting the cast this week although to be honest I am reluctant to do that as she's been having nausea, vomiting, and seizure activity over the past three days. She has [name] history of seizures and have been told is nothing that can be done for these. With that being said I do believe that along with the seizures have the nausea vomiting which upon further questioning doesn't seem to be the normal for her and makes me concerned for the possibility of infection or something else  going on. I discussed this with the patient and her mother during the office visit today. I do not feel the wound is effective but maybe something else. The responses this was "this just happens to her at times and we don't know why". They did not seem to be interested in going to the hospital to have this checked out further. 10/09/18 on evaluation today patient presents for follow-up concerning her ongoing toe ulcer. She has been tolerating the dressing changes without complication. Fortunately there does not appear to be any evidence of infection which is great news however I do think that the patient would benefit from going ahead for with the total contact cast. She's actually in [name] wheelchair today she tells me that she will use her walker if we initiate the cast. I was very specific about the fact that if we were gonna do the cast I wanted to make sure that she was using the walker in order to prevent any falls. She tells me she does not have stairs that she has to traverse on [name] regular basis at her home. She has not had any seizures since last week again that something that happens to her often she tells me she did talk to [employer] and they said that it may take up to three weeks to get the brace approved for her. Hopefully that will not take that long but nonetheless in the meantime I do think the cast could be of benefit. 10/12/18 on evaluation today patient appears to be doing rather well in regard to her toe ulcer. It's just been [name] few days and already this is significantly improved both as far as overall appearance and size. Fortunately there's no sign of infection. She is here for her first obligatory cast change. 10/19/18 Seen today for follow up and management of left great toe ulcer. Wound continues to show improvement. Noted small open area with seroussang  drainage with palpation. Denies any increased pain or recent fevers during visit. She will continue calcium alginate with  offloading shoe. Denies any questions or concerns during visit. 10/26/18 on evaluation today patient appears to be doing about the same as when I last saw her in regard to her wound bed. Fortunately there does not appear to be any signs of infection. Unfortunately she continues to have [name] breakdown in regard to the toe region any time that she is not in the cast. It takes almost no time at all for this to happen. Nonetheless she still has not heard anything from the brace being made by [employer] as to when exactly this will be available to her. Fortunately there is no signs of infection at this time. 10/30/18 on evaluation today patient presents for application of the total contact cast as we just received him this morning. Fortunately we are gonna be able to apply this to her today which is great news. She continues to have no significant pain which is good news. Overall I do feel like things have been improving while she was the cast is when she doesn't have [name] cast that things get worse. She still has not really heard anything from [facility] regarding her brace. 11/02/18 upon evaluation today patient's wound already appears to be doing significantly better which is good news. Fortunately there does not appear to be any signs of infection also good news. Overall I do think the total contact cast as before is helping to heal this area unfortunately it's just not gonna likely keep the area closed and healed without her getting her brace at least. Again the foot drop is [name] significant issue for her. 11/09/18 on evaluation today patient appears to be doing excellent in regard to her toe ulcer which in fact is completely healed. Fortunately we finally got the situation squared away with the paperwork which was needed to proceed with getting her brace approved by Medicaid. I have filled that out unfortunately that information has been sent to the orthopedic office that I worked at 2 1/2 years ago and not tired Current  wound care measures. Fortunately she seems to be doing very well at this time. 11/23/18 on evaluation today patient appears to be doing More Poorly Compared to Last Time I Saw Her. At [facility] She Had Completely Healed. Currently she is continuing to have issues with reopening. She states that she just found out that the brace was approved through Medicaid now she just has to go get measured in order to have this fitted for her and then made. Subsequently she does not have an appointment for this yet that is going to complicate things we obviously cannot put her back in the cast if we do not have everything measured because they're not gonna be able to measure her foot while she is in the cast. 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 Obviously this is unfortunate and does have me somewhat worried as well. 11/30/18 on evaluation today patient's toe ulcer actually appears to be doing fairly well. The good news is she will be getting her brace in the shoes next week on Wednesday. Hopefully we will be able to get this to heal without having to go back in the cast however she may need the cast in order to get the wound completely heal and then go from there. Fortunately  there's no signs of infection at this time. 12/07/18 on evaluation today patient fortunately did receive her brace and she states she could tell this definitely makes her walk better. With that being said she's been having issues with her toe where she noticed yesterday there was [name] lot of tissue that was loosing off this appears to be much larger than what it was previous. She also states that her leg has been read putting much across the top of her foot just about the ankle although this seems to be receiving somewhat. The total area is still red and appears to be someone infected as best I can tell. She is previously taken Bactrim and that  may be [name] good option for her today as well. We are gonna see what I wound culture shows as well and I think that this is definitely appropriate. With that being said outside of the culture I still need to initiate something in the interim and that's what I'm gonna go ahead and select Bactrim is [name] good option for her. 12/14/18 on evaluation today patient appears to be doing better in regard to her left great toe ulcer as compared to last week's evaluation. There's still some erythema although this is significantly improved which is excellent news. Overall I do believe that she is making good progress is still gonna take some time before she is where I would like her to be from the standpoint of being able to place her back into the total contact cast. Hopefully we will be where we need to be by next week. 12/21/18 on evaluation today patient actually appears to be doing poorly in regard to her toe ulcer. She's been tolerating the dressing changes without complication. Fortunately there's no signs of systemic infection although she does have [name] lot of drainage from the toe ulcer and this does Blaze, [name] ([id number]) seem to be causing some issues at this point. She does have erythema on the distal portion of her toe that appears to be likely cellulitis. 12/28/18 on evaluation today patient actually appears to be doing [name] little better in my pinion in regard to her toe ulcer. With that being said she still does have some evidence of infection at this time and for her culture she had both E. coli as well as enterococcus as organisms noted on evaluation. For that reason I think that though the Keflex likely has treated the E. coli rather well this has really done nothing for the enterococcus. We are going to have to initiate treatment for this specifically. 01/04/19 on evaluation today patient's toe actually appears to be doing better from the standpoint of infection. She currently would like to see about  putting the cash back on I think that this is appropriate as long as she takes care of it and keeps it from getting wet. She is gonna have some drainage we can definitely pass this up with Drawtex and alginate to try to prevent as much drainage as possible from causing the problems. With that being said I do want to at least try her with the cast between now and Tuesday. If there any issues we can't continue to use it then I will discontinue the use of the cast at that point. 01/08/19 on evaluation today patient actually appears to be doing very well as far as her foot ulcer specifically the great toe on the left is concerned. She did have an area of rubbing on the medial aspect of her left  ankle which again is from the cast. Fortunately there's no signs of infection at this point in this appears to be [name] very slight skin breakdown. The patient tells me she felt it rubbing but didn't think it was that bad. Fortunately there is no signs of active infection at this time which is good news. No fevers, chills, nausea, or vomiting noted at this time. 01/15/19 on evaluation today patient actually appears to be doing well in regard to her toe ulcer. Again as previous she seems to do well and she has the cast on which indicates to me that during the time she doesn't have [name] cast on she's putting way too much pressure on this region. Obviously I think that's gonna be an issue as with the current national emergency concerning the Covid-19 Virus it has been recommended that we discontinue the use of total contact casting by the chief medical officer of our company, Dr. Simona Huh. The reasoning is that if [name] patient becomes sick and cannot come into have the cast removed they could not just leave this on for an additional two weeks. Obviously the hospitals also do not want to receive patient's who are sick into the emergency department to potentially contaminate the region and spread the Covid-19 Virus among other sick  individuals within the hospital system. Therefore at this point we are suspending the use of total contact cast until the current emergency subsides. This was all discussed with the patient today as well. 01/22/19 on evaluation today patient's wound on her left great toe appears to be doing slightly worse than previously noted last week. She tells me that she has been on this quite [name] bit in fact she tells me she's been awake for 38 straight hours. This is due to the fact that she's having to care for grandparents because nobody else will. She has been taking care of them for five the last seven days since I've seen her they both have dementia his is from [name] stroke and her grandmother's was progressive. Nonetheless she states even her mom who knows her condition and situation has only help two of those days to take care of them she's been taking care of the rest. Fortunately there does not appear to be any signs of active infection in regard to her toe at this point although obviously it doesn't look as good as it did previous. I think this is directly related to her not taking off the pressure and friction by way of taking things easy. Though I completely understand what's going on. 01/29/19 on evaluation today patient's tools are actually appears to be showing some signs of improvement today compared to last week's evaluation as far as not necessarily the overall size of the wound but the fact that she has some new skin growth in between the two ends of the wound opening. Overall I feel like she has done well she states that she had [name] family member give her what sounds to be [name] CAM walker boot which has been helpful as well. 02/05/19 on evaluation today patient's wound bed actually appears to be doing significantly better in regard to her overall appearance of the size of the wound. With that being said she is still having an issue with offloading efficiently enough to get this to close. Apparently there is  some signs of infection at this point as well unfortunately. Previously she's done well of Augmentin I really do not see anything that needs to be culture currently but  there theme and cellulitis of the foot that I'm seeing I'm gonna go ahead and place her on an antibiotic today to try to help clear this up. 02/12/2019 on evaluation today patient actually appears to be doing poorly in regard to her overall wound status. She tells me she has been using her offloading shoe but actually comes in today wearing her tennis shoe with the AFO brace. Again as I previously discussed with her this is really not sufficient to allow the area to heal appropriately. Nonetheless she continues to be somewhat noncompliant and I do wonder based on what she has told my nurse in the past as to whether or not [name] good portion of this noncompliance may be recreational drug and alcohol related. She has had [name] history of heroin overdose and this was fairly recently in the past couple of months that have been seeing her. Nonetheless overall I feel like her wound looks significantly worse today compared to what it was previous. She still has significant erythema despite the Augmentin I am not sure that this is an appropriate medication for her infection I am also concerned that the infection may have gone down into her bone. 02/19/19 on evaluation today patient actually appears to be doing about the same in regard to her toe ulcer. Unfortunately she continues to show signs of bone exposure and infection at this point. There does not appear to be any evidence of worsening of the infection but I'm also not really sure that it's getting significantly better. She is on the Augmentin which should be sufficient for the Staphylococcus aureus infection that she has at this point. With that being said she may need IV antibiotics to more appropriately treat this. We did have [name] discussion today about hyperbaric option therapy. 02/28/19 on  evaluation today patient actually appears to be doing much worse in regard to the wound on her left great toe as compared to even my previous evaluation last week. Unfortunately this seems to be training in [name] pretty poor direction. Her toe was actually now starting to angle laterally and I can actually see the entire joint area of the proximal portion of the digit where is the distal portion of the digit again is no longer even in contact with the joint line. Unfortunately there's [name] lot more necrotic tissue around the edge and the toe appears to be showing signs of becoming gangrenous in my pinion. I'm very concerned about were things stand at this point. She did see infectious disease and they are planning to send in [name] prescription for Sivextro for her and apparently this has been approved. With that being said I don't think she should avoid taking this but at the same time I'm not sure that it's gonna be sufficient to save her toe at this point. She tells me that she still having to care for grandparents which I think is putting quite [name] bit of strain on her foot and specifically the total area and has caused this to break down even to [name] greater degree than would've otherwise been expected. 03/05/19 on evaluation today patient actually appears to be doing quite well in regard to her toe all things considering. She still has bone exposed but there appears to be much less your thing on overall the appearance of the wound and the toe itself is dramatically improved. She still does have some issues currently obviously with infection she did see vascular as well and there concerned that her blood flow to  the toad. For that reason they are setting up for an angiogram next week. 03/14/19 on evaluation today patient appears to be doing very poor in regard to her toe and specifically in regard to the ulceration and the fact that she's starting to notice the toe was leaning even more towards the lateral aspect  and the complete joint is visible on the proximal aspect of the joint. Nonetheless she's also noted [name] significant odor and the tip of the toe is turning more dark and necrotic appearing. Overall I think she is getting worse not better as far as this is concerned. For that reason I am recommending at this point that she likely needs to be seen for likely amputation. JAVANNA, [name] ([id number]) READMISSION 03/19/2021 This is [name] patient that we cared for in this clinic for [name] prolonged period of time in 2019 and 2020 with [name] left foot and left first toe wound. 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 Luana Shu. In December 2021 she had an ulcer on her right great toe as well as the fourth and fifth toes. 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 She also had an angiogram at that time and underwent angioplasty of the right anterior tibial artery. In any case she claims that the wound on the right foot is closed I did not look at this today which was probably an oversight although I think that should be done next week. 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 According to Dr. Deborra Medina last review that she was out of the area being cared for by another physician but recently came back to his attention. 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 [name] culture of this area showed E. coli apparently before she came back to see Dr. Luana Shu she was supposed to be receiving antibiotics but she did not really take them. Nor is she offloading this area at all. Finally her last hemoglobin A1c listed in epic was in March 2022 at 14.1 she says things are [name] lot better since then although I am not sure. 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 She had this wound at the time. During this admission she had E. coli sepsis [name] TEE was negative. 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 She is still [name] heavy cigarette smoker. She has [name] history of bipolar disorder seizure disorder ABI in our clinic was 1.05 6/1; left midfoot in the setting of [name] TMA done previously. Round circular wound with [name] "knuckle" of protruding tissue. The problem is that the knuckle was not attached to any of the surrounding granulation and this probed proximally widely I removed [name] large portion of this tissue. This wound goes with considerable undermining laterally. I do not feel any bone there was no purulence but this is [name] deep wound. 6/8; in spite of the debridement I did last week. She arrives with [name] wound looking exactly the same. [name] protruding "knuckle" of tissue nonadherent to most of the surrounding tissue. There is considerable  depth around this from 6-12 o'clock at 2.7 cm and undermining of 1 cm. This does not look overtly infected and the x-ray I did last week was negative for any osseous abnormalities. We have been using silver collagen 6/15; deep tissue culture I did last week showed moderate staph aureus and moderate Pseudomonas. This will definitely require prolonged antibiotic therapy. The pathology on the protuberant area was negative for malignancy fungus etc. 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 We have been using silver collagen. I am going to be prescribing Levaquin for 2 weeks. Her CT scan of the foot is down for 7/5 6/22; CT scan of the foot on 7 5. She says she has hardware in the left leg from her previous  fracture. She is on the Levaquin for the deep tissue culture I did that showed methicillin sensitive staph aureus and Pseudomonas. I gave her [name] 2-week supply and she will have another week. She arrives in clinic today with the same protuberant tissue however this is nonadherent to the tissue surrounding it. I am really at [name] loss to explain this unless there is underlying deep tissue infection 6/29; patient presents for 1 week follow-up. She has been using collagen to the wound bed. She reports taking her antibiotics as prescribed.She has no complaints or issues today. She denies signs of infection. 7/6; patient presents for one week followup. She has been using collagen to the wound bed. She states she is taking Levaquin however at times she is not able to keep it down. She denies signs of infection. 7/13; patient presents for 1 week follow-up. She has been using silver alginate to the wound bed. She still has nausea when taking her antibiotics. She denies signs of infection. 7/20; patient presents for 1 week follow-up. She has been using silver alginate with gentamicin cream to the wound bed. She denies any issues and has no complaints today. She denies signs of infection. 7/27; patient presents for 1 week follow-up. She continues to use silver alginate with gentamicin cream to the wound bed. She reports starting her antibiotics. She has no issues or complaints. Overall she reports stability to the wound. 8/3; patient presents for 1 week follow-up. She has been using silver alginate with gentamicin cream to the wound bed. She reports completing all antibiotics. She has no issues or complaints today. She denies signs of infection. 8/17; patient presents for 2-week follow-up. He is to use silver alginate to the wound bed. She has no issues or complaints today. She denies signs of infection. She reports her pain has improved in her foot since last clinic visit 8/24; patient presents for 1 week  follow-up. She continues to use silver alginate to the wound bed. She has no issues or complaints. She denies signs of infection. Pain is stable. 9/7; patient presents for follow-up. She missed her last week appointment due to feeling ill. She continues to use silver alginate. She has [name] new wound to the right lower extremity that is covered in eschar. She states It occurred over the past week and has no idea how it started. She currently denies signs of infection. 9/14; patient presents for follow-up. To the left foot wound she has been using gentamicin cream and silver alginate. To the right lower extremity wound she has been keeping this covered and has not obtain Santyl. 9/21; patient presents for follow-up. She reports using gentamicin cream and silver alginate to the left foot and Santyl to  the right lower extremity wound. She has no issues or complaints today. She denies signs of infection. 9/28; patient presents for follow-up. She reports [name] new wound to her right heel. She states this occurred [name] few days ago and is progressively gotten worse. She has been trying to clean the area with [name] Q-tip and Santyl. She reports stability in the other 2 wounds. She has been using gentamicin cream and silver alginate to the left foot and Santyl to the right lower extremity wound. 10/12; patient presents for follow-up. She reports improvement to the wound beds. She is seeing vein and vascular to discuss the potential of [name] left BKA. She states they are going to do an arteriogram. She continues to use silver alginate with dressing changes to her wounds. 11/2; patient presents for follow-up. She states she has not been doing dressing changes to the wound beds. She states she is not able to offload the areas. She reports chronic pain to her left foot wound. 11/9; patient presents for follow-up. She came in with only socks on. She states she forgot to put on shoes. It is unclear if she is doing any dressing  changes. She currently denies systemic signs of infection. 11/16; patient presents for follow-up. She came again only with socks on. She states she does not wear shoes ever. It is unclear if she does dressing changes. She currently denies systemic signs of infection. 11/23; patient presents for follow-up. She wore her shoes today. It still unclear exactly what dressing she is using for each wound but she did states she obtained Dakin's solution and has been using this to the left foot wound. She currently denies signs of infection. [name], [name] ([id number]) 11/30; patient presents for follow-up. She has no issues or complaints today. She currently denies signs of infection. 12/7; patient presents for follow-up. She has no issues or complaints today. She has been using Hydrofera Blue to the right heel wound and Dakin solution to the left foot wound. Her right anterior leg wound is healed. She currently denies signs of infection. 12/14; patient presents for follow-up. She has been using Hydrofera Blue to the right heel and Dakin's to the left foot wounds. She has no issues or complaints today. She denies signs of infection. 12/21; patient presents for follow-up. She reports using Hydrofera Blue to the right heel and Dakin's to the left foot wound. She denies signs of infection. 12/28; patient presents for follow-up. She continues to use Dakin's to the left foot wound and Hydrofera Blue to the right heel wound. She denies signs of infection. 1/4; patient presents for follow-up. She has no issues or complaints today. She denies signs of infection. 1/11; patient presents for follow-up. It is unclear if she has been dressing these wounds over the past week. She currently denies signs of infection. 1/18; patient presents for follow-up. She states she has been using Dakin's wet-to-dry dressings to the left foot. She has been using Hydrofera Blue to the right foot foot wound. She states that the  anterior right leg wound has reopened and draining serous fluid. She denies signs of infection. 1/25; patient presents for follow-up. She has no issues or complaints today. 2/1; patient presents for follow-up. She has no issues or complaints today. She denies signs of infection. 2/8; patient presents for follow-up. She has lost her surgical shoes. She did not have [name] dressing to the right heel wound. She currently denies signs of infection. 2/15; patient presents for follow-up. She reports more  pain to the right heel today. She denies purulent drainage Or fever/chills Objective Constitutional Vitals Time Taken: 10:41 AM, Height: 69 in, Weight: 185 lbs, BMI: 27.3, Temperature: 97.5 F, Pulse: 103 bpm, Respiratory Rate: 18 breaths/min, Blood Pressure: 125/79 mmHg. General Notes: Left foot (Transmetatarsal amputation): Wound to the plantar aspect that has granulation tissue and scant nonviable tissue And circumferential Callus.. Right foot: To the heel there is an open wound with Granulation tissue , And nonviable tissue with Circumferential callus. Tenderness on exam. Right lower extremity: Thin layer of skin over previous wound site. Integumentary (Hair, Skin) Wound #10 status is Healed - Epithelialized. Original cause of wound was Gradually Appeared. The date acquired was: 11/04/2021. The wound has been in treatment 4 weeks. The wound is located on the Right,Midline Lower Leg. The wound measures 0cm length x 0cm width x 0cm depth; 0cm^2 area and 0cm^3 volume. There is Fat Layer (Subcutaneous Tissue) exposed. There is no tunneling or undermining noted. There is [name] none present amount of drainage noted. There is no granulation within the wound bed. There is no necrotic tissue within the wound bed. Wound #7 status is Open. Original cause of wound was Gradually Appeared. The date acquired was: 08/07/2020. The wound has been in treatment 37 weeks. The wound is located on the [place].  The wound measures 4.2cm length x 1.2cm width x 0.3cm depth; 3.958cm^2 area and 1.188cm^3 volume. There is Fat Layer (Subcutaneous Tissue) exposed. There is no tunneling or undermining noted. There is [name] large amount of serosanguineous drainage noted. The wound margin is thickened. There is large (67-100%) red, pink, hyper - granulation within the wound bed. There is [name] small (1-33%) amount of necrotic tissue within the wound bed including Adherent Slough. Wound #9 status is Open. Original cause of wound was Gradually Appeared. The date acquired was: 07/18/2021. The wound has been in treatment 20 weeks. The wound is located on the Right Calcaneus. The wound measures 0.9cm length x 1cm width x 1.9cm depth; 0.707cm^2 area and 1.343cm^3 volume. There is Fat Layer (Subcutaneous Tissue) exposed. There is no tunneling or undermining noted. There is [name] medium amount of serosanguineous drainage noted. The wound margin is well defined and not attached to the wound base. There is small (1-33%) pink granulation within the wound bed. There is [name] large (67-100%) amount of necrotic tissue within the wound bed including Adherent Slough. Assessment Active Problems Tamez, [name] 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 I debrided nonviable tissue. I recommended using Dakin's wet-to-dry dressings to this area. Unfortunately the right heel wound is much deeper today. It overlays bone. With increased pain And worsening of  the wound will treat with clindamycin. I recommended staying off of the foot completely. We will also obtain [name] CT scan without contrast. Patient cannot obtain an MRI and her kidney disease does not allow for contrast. I recommended Dakin's wet-to-dry dressings to this area as well. The anterior shin has [name] thin epithelialized layer. Nothing to do here. Procedures Wound #7 Pre-procedure diagnosis of Wound #  7 is [name] Diabetic Wound/Ulcer of the Lower Extremity located on the [place] .Severity of Tissue Pre Debridement is: Fat layer exposed. There was [name] Excisional Skin/Subcutaneous Tissue Debridement with [name] total area of 5.04 sq cm performed by Kalman Shan, MD. With the following instrument(s): Curette to remove Viable and Non-Viable tissue/material. Material removed includes Callus and Subcutaneous Tissue and. No specimens were taken. [name] time out was conducted at 11:15, prior to the start of the procedure. [name] Minimum amount of bleeding was controlled with Pressure. The procedure was tolerated well. Post Debridement Measurements: 4.2cm length x 1.2cm width x 0.3cm depth; 1.188cm^3 volume. Character of Wound/Ulcer Post Debridement is stable. Severity of Tissue Post Debridement is: Fat layer exposed. Post procedure Diagnosis Wound #7: Same as Pre-Procedure Plan Follow-up Appointments: Return Appointment in 1 week. Bathing/ Shower/ Hygiene: Wash wounds with antibacterial soap and water. Off-Loading: Open toe surgical shoe with peg assist. Turn and reposition every 2 hours Other: - peg shoe right, off-loader left, pt advised to stay off feet as much as possible Medications-Please add to medication list.: P.O. Antibiotics Other: - per MD Heber [name] ok to leave right lower leg dressing on for 3-4 days and then continue daily Radiology ordered were: Computed Tomography (CT) Scan , Lower extremity without contrast - CT scan without contrast of right calcaneus The following medication(s)  was prescribed: clindamycin HCl oral 300 mg capsule 1 1 capsule oral q6h x 10 days starting 12/08/2021 WOUND #7: - Foot Wound Laterality: Plantar, Left, Midline Cleanser: Dakin 16 (oz) 0.25 1 x Per Day/30 Days Discharge Instructions: Use as directed. Primary Dressing: Gauze 1 x Per Day/30 Days Discharge Instructions: As directed: moistened with Dakins Solution Secondary Dressing: Kerlix 4.5 x 4.1 (in/yd) 1 x Per Day/30 Days Discharge Instructions: Apply Kerlix 4.5 x 4.1 (in/yd) as instructed Secured With: Kerlix Roll Sterile or Non-Sterile 6-ply 4.5x4 (yd/yd) 1 x Per Day/30 Days Discharge Instructions: Apply Kerlix as directed WOUND #9: - Calcaneus Wound Laterality: Right Cleanser: Dakin 16 (oz) 0.25 1 x Per Day/30 Days Discharge Instructions: Use as directed. Cleanser: Soap and Water 1 x Per Day/30 Days Discharge Instructions: Gently cleanse wound with antibacterial soap, rinse and pat dry prior to dressing wounds Primary Dressing: Gauze 1 x Per Day/30 Days Discharge Instructions: As directed: moistened moistened with Dakins Solution Secondary Dressing: Gauze 1 x Per Day/30 Days Discharge Instructions: As directed: dry Secondary Dressing: Kerlix 4.5 x 4.1 (in/yd) 1 x Per Day/30 Days Discharge Instructions: Apply Kerlix 4.5 x 4.1 (in/yd) as instructed Secured With: 48M Medipore H Soft Cloth Surgical Tape, 2x2 (in/yd) 1 x Per Day/30 Days Secured With: coverlet 1 x Per Day/30 Days [name], [name] ([id number]) 1. Dakin's wet-to-dry 2. Clindamycin 3. Aggressive offloading 4. CT scan of the right foot 5. Follow-up in 1 week Electronic Signature(s) Signed: 12/08/2021 12:56:59 PM By: Kalman Shan DO Entered By: Kalman Shan on 12/08/2021 12:55:27 [name] ([id number]) -------------------------------------------------------------------------------- SuperBill Details Patient Name: [name] Date of Service: 12/08/2021 Medical Record Number: [id number] Patient  Account Number: [id number] Date of Birth/Sex: February 06, 1976 (46 y.o. F) Treating RN: Levora Dredge Primary Care Provider: Raelene Bott Other Clinician: Referring Provider: Raelene Bott Treating Provider/Extender: Yaakov Guthrie in Treatment: 37 Diagnosis Coding ICD-10 Codes Code Description [phone] 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ity L97.512 Non-pressure chronic ulcer of other part of right foot with fat layer exposed L97.812 Non-pressure chronic ulcer of other part of right lower leg with fat layer exposed  E11.621 Type 2 diabetes mellitus with foot ulcer E11.42 Type 2 diabetes mellitus with diabetic polyneuropathy L97.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 of skin Facility Procedures CPT4 Code: [id number] Description: 17494 - DEB SUBQ TISSUE 20 SQ CM/< Modifier: Quantity: 1 CPT4 Code: Description: ICD-10 Diagnosis Description 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 Modifier: severity Quantity: Physician Procedures CPT4 Code: 4967591 Description: 63846 - WC PHYS LEVEL 4 - EST PT Modifier: Quantity: 1 CPT4 Code: Description: ICD-10 Diagnosis Description L97.512 Non-pressure chronic ulcer of other part of right foot with fat layer expo E11.621 Type 2 diabetes mellitus with foot ulcer Modifier: sed Quantity: CPT4 Code: [id number] Description: 11042 - WC PHYS SUBQ TISS 20 SQ CM Modifier: Quantity: 1 CPT4 Code: Description: ICD-10 Diagnosis Description 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 Modifier: severity Quantity: Electronic Signature(s) Signed: 12/08/2021 12:56:59 PM By: Kalman Shan DO Entered By: Kalman Shan on 12/08/2021 12:55:39

## 2021-12-08 NOTE — Progress Notes (Signed)
RIELYN, KRUPINSKI ([id number]) Visit Report for 12/08/2021 Arrival Information Details Patient Name: [name], [name] Date of Service: 12/08/2021 10:30 AM Medical Record Number: [id number] Patient Account Number: [id number] Date of Birth/Sex: 1975-11-08 (46 y.o. F) Treating RN: [name] Primary Care [name]: [name] Other Clinician: Referring [name]: [name] Treating Dj [name]: [name] in Treatment: 59 Visit Information History Since Last Visit Added or deleted any medications: No Patient Arrived: Wheel Chair Any new allergies or adverse reactions: No Arrival Time: 10:38 Had a fall or experienced change in No Accompanied By: mother activities of daily living that may affect Transfer Assistance: EasyPivot Patient Lift risk of falls: Patient Identification Verified: Yes Hospitalized since last visit: No Secondary Verification Process Completed: Yes Has Dressing in Place as Prescribed: Yes Patient Requires Transmission-Based No Has Footwear/Offloading in Place as Prescribed: Yes Precautions: Left: Surgical Shoe with Pressure Relief Patient Has Alerts: Yes Insole Patient Alerts: Patient on Blood Right: Surgical Shoe with Pressure Relief Thinner Insole 58m apirin Pain Present Now: Yes Diabetic Type II Electronic Signature(s) Signed: 12/08/2021 5:33:03 PM By: Heather DredgeEntered By: Heather Dredgeon 12/08/2021 10:40:50 SLonzo [name]([id number] -------------------------------------------------------------------------------- Clinic Level of Care Assessment Details Patient Name: Heather DearA. Date of Service: 12/08/2021 10:30 AM Medical Record Number: [id number] Account Number: [id number] of Birth/Sex: [birth year](46y.o. F) Treating RN: Heather DredgePrimary Care [name]: HRaelene BottOther Clinician: Referring [name]: HRaelene BottTreating [name]: HYaakov Guthriein Treatment:  37 Clinic Level of Care Assessment Items TOOL 1 Quantity Score []  - Use when EandM and Procedure is performed on INITIAL visit 0 ASSESSMENTS - Nursing Assessment / Reassessment []  - General Physical Exam (combine w/ comprehensive assessment (listed just below) when performed on new 0 pt. evals) []  - 0 Comprehensive Assessment (HX, ROS, Risk Assessments, Wounds Hx, etc.) ASSESSMENTS - Wound and Skin Assessment / Reassessment []  - Dermatologic / Skin Assessment (not related to wound area) 0 ASSESSMENTS - Ostomy and/or Continence Assessment and Care []  - Incontinence Assessment and Management 0 []  - 0 Ostomy Care Assessment and Management (repouching, etc.) PROCESS - Coordination of Care []  - Simple Patient / Family Education for ongoing care 0 []  - 0 Complex (extensive) Patient / Family Education for ongoing care []  - 0 Staff obtains CProgrammer, systems Records, Test Results / Process Orders []  - 0 Staff telephones HHA, Nursing Homes / Clarify orders / etc []  - 0 Routine Transfer to another Facility (non-emergent condition) []  - 0 Routine Hospital Admission (non-emergent condition) []  - 0 New Admissions / IBiomedical engineer/ Ordering NPWT, Apligraf, etc. []  - 0 Emergency Hospital Admission (emergent condition) PROCESS - Special Needs []  - Pediatric / Minor Patient Management 0 []  - 0 Isolation Patient Management []  - 0 Hearing / Language / Visual special needs []  - 0 Assessment of Community assistance (transportation, D/C planning, etc.) []  - 0 Additional assistance / Altered mentation []  - 0 Support Surface(s) Assessment (bed, cushion, seat, etc.) INTERVENTIONS - Miscellaneous []  - External ear exam 0 []  - 0 Patient Transfer (multiple staff / HCivil Service fast streamer/ Similar devices) []  - 0 Simple Staple / Suture removal (25 or less) []  - 0 Complex Staple / Suture removal (26 or more) []  - 0 Hypo/Hyperglycemic Management (do not check if billed separately) []  - 0 Ankle /  Brachial Index (ABI) - do not check if billed separately Has the patient been seen at the hospital within the last three years: Yes Total Score: 0 Level Of Care:  ____ [name] ([id number]) Electronic Signature(s) Signed: 12/08/2021 5:33:03 PM By: [name] Entered By: [name] on 12/08/2021 12:04:53 [name] ([id number]) -------------------------------------------------------------------------------- Encounter Discharge Information Details Patient Name: Heather Dear A. Date of Service: 12/08/2021 10:30 AM Medical Record Number: [id number] Patient Account Number: [id number] Date of Birth/Sex: [birth year] (46 y.o. F) Treating RN: [name] Primary Care [name]: [name] Other Clinician: Referring [name]: [name] Treating [name]: [name] in Treatment: 37 Encounter Discharge Information Items Post Procedure Vitals Discharge Condition: Stable Temperature (F): 97.5 Ambulatory Status: Wheelchair Pulse (bpm): 103 Discharge Destination: Home Respiratory Rate (breaths/min): 18 Transportation: Private Auto Blood Pressure (mmHg): 125/79 Accompanied By: mother Schedule Follow-up Appointment: Yes Clinical Summary of Care: Electronic Signature(s) Signed: 12/08/2021 12:08:58 PM By: [name] Entered By: [name] on 12/08/2021 12:08:57 [name] ([id number]) -------------------------------------------------------------------------------- Lower Extremity Assessment Details Patient Name: Heather Dear A. Date of Service: 12/08/2021 10:30 AM Medical Record Number: [id number] Patient Account Number: [id number] Date of Birth/Sex: 04-02-1976 (46 y.o. F) Treating RN: [name] Primary Care Shaheer Bonfield: [name] Other Clinician: Referring [name]: [name] Treating [name]: [name] in Treatment: 37 Edema Assessment Assessed: [Left: No] [Right: No] Edema: [Left:  No] [Right: Yes] Calf Left: Right: Point of Measurement: 30 cm From Medial Instep 29 cm 29.4 cm Ankle Left: Right: Point of Measurement: 10 cm From Medial Instep 18 cm 19.4 cm Vascular Assessment Pulses: Dorsalis Pedis Palpable: [Left:Yes] [Right:Yes] Posterior Tibial Palpable: [Left:Yes] [Right:Yes] Electronic Signature(s) Signed: 12/08/2021 5:33:03 PM By: [name] Entered By: [name] on 12/08/2021 11:14:44 [name], [name] ([id number]) -------------------------------------------------------------------------------- Multi Wound Chart Details Patient Name: Heather Dear A. Date of Service: 12/08/2021 10:30 AM Medical Record Number: [id number] Patient Account Number: [id number] Date of Birth/Sex: 11-10-1975 (46 y.o. F) Treating RN: [name] Primary Care Kalie Cabral: [name] Other Clinician: Referring Nayomi Tabron: [name] Treating Amellia Panik/Extender: [name] in Treatment: 37 Vital Signs Height(in): 50 Pulse(bpm): 103 Weight(lbs): 185 Blood Pressure(mmHg): 125/79 Body Mass Index(BMI): 27.3 Temperature(F): 97.5 Respiratory Rate(breaths/min): 18 Photos: Wound Location: Right, Midline Lower Leg Left, Midline, Plantar Foot Right Calcaneus Wounding Event: Gradually Appeared Gradually Appeared Gradually Appeared Primary Etiology: Diabetic Wound/Ulcer of the Lower Diabetic Wound/Ulcer of the Lower Diabetic Wound/Ulcer of the Lower Extremity Extremity Extremity Comorbid History: Chronic sinus problems/congestion, Chronic sinus problems/congestion, Chronic sinus problems/congestion, Middle ear problems, Anemia, Middle ear problems, Anemia, Middle ear problems, Anemia, Chronic Obstructive Pulmonary Chronic Obstructive Pulmonary Chronic Obstructive Pulmonary Disease (COPD), Congestive Heart Disease (COPD), Congestive Heart Disease (COPD), Congestive Heart Failure, Type II Diabetes, End Stage Failure, Type II Diabetes, End Stage Failure, Type  II Diabetes, End Stage Renal Disease, History of pressure Renal Disease, History of pressure Renal Disease, History of pressure wounds, Neuropathy wounds, Neuropathy wounds, Neuropathy Date Acquired: 11/04/2021 08/07/2020 07/18/2021 Weeks of Treatment: 4 37 20 Wound Status: Open Open Open Wound Recurrence: No No No Measurements L x W x D (cm) 0.1x0.1x0.1 4.2x1.2x0.3 0.9x1x0.5 Area (cm) : 0.008 3.958 0.707 Volume (cm) : 0.001 1.188 0.353 % Reduction in Area: 99.20% -48.20% -114.20% % Reduction in Volume: 99.00% 11.00% -52.80% Classification: Grade 1 Grade 2 Grade 1 Exudate Amount: None Present Large Medium Exudate Type: N/A Serosanguineous Serosanguineous Exudate Color: N/A red, brown red, brown Wound Margin: N/A Thickened Well defined, not attached Granulation Amount: None Present (0%) Large (67-100%) Small (1-33%) Granulation Quality: N/A Red, Pink, Hyper-granulation Pink Necrotic Amount: None Present (0%) Small (1-33%) Large (67-100%) Exposed Structures: Fat Layer (Subcutaneous Tissue): Fat Layer (Subcutaneous Tissue): Fat Layer (Subcutaneous  Tissue): Yes Yes Yes Fascia: No Fascia: No Tendon: No Tendon: No Muscle: No Muscle: No Joint: No Joint: No Bone: No Bone: No Epithelialization: Large (67-100%) Small (1-33%) None Treatment Notes Electronic Signature(s) REET, SCHARRER ([id number]) Signed: 12/08/2021 5:33:03 PM By: [name] Entered By: [name] on 12/08/2021 11:15:42 [name] ([id number]) -------------------------------------------------------------------------------- Quinn Details Patient Name: Heather Dear A. Date of Service: 12/08/2021 10:30 AM Medical Record Number: [id number] Patient Account Number: [id number] Date of Birth/Sex: 1976-01-16 (46 y.o. F) Treating RN: [name] Primary Care Kshawn Canal: [name] Other Clinician: Referring Acsa Estey: [name] Treating Amanat Hackel/Extender: [name] in Treatment: 37 Active Inactive Necrotic Tissue Nursing Diagnoses: Impaired tissue integrity related to necrotic/devitalized tissue Knowledge deficit related to management of necrotic/devitalized tissue Goals: Necrotic/devitalized tissue will be minimized in the wound bed Date Initiated: 04/07/2021 Target Resolution Date: 10/08/2021 Goal Status: Active Patient/caregiver will verbalize understanding of reason and process for debridement of necrotic tissue Date Initiated: 04/07/2021 Date Inactivated: 09/22/2021 Target Resolution Date: 09/08/2021 Goal Status: Met Interventions: Assess patient pain level pre-, during and post procedure and prior to discharge Provide education on necrotic tissue and debridement process Treatment Activities: Excisional debridement : 04/07/2021 Notes: Electronic Signature(s) Signed: 12/08/2021 5:33:03 PM By: [name] Entered By: [name] on 12/08/2021 11:15:15 [name], [name] ([id number]) -------------------------------------------------------------------------------- Pain Assessment Details Patient Name: Heather Dear A. Date of Service: 12/08/2021 10:30 AM Medical Record Number: [id number] Patient Account Number: [id number] Date of Birth/Sex: [birth year] (46 y.o. F) Treating RN: [name] Primary Care Rashan Patient: [name] Other Clinician: Referring Skanda Worlds: [name] Treating Gaylan Fauver/Extender: [name] in Treatment: 37 Active Problems Location of Pain Severity and Description of Pain Patient Has Paino Yes Site Locations Rate the pain. Current Pain Level: 10 Pain Management and Medication Current Pain Management: Notes pt states 10/10 pain in right foot Electronic Signature(s) Signed: 12/08/2021 5:33:03 PM By: [name] Entered By: [name] on 12/08/2021 10:42:50 [name]  ([id number]) -------------------------------------------------------------------------------- Patient/Caregiver Education Details Patient Name: Heather Dear A. Date of Service: 12/08/2021 10:30 AM Medical Record Number: [id number] Patient Account Number: [id number] Date of Birth/Gender: Mar 24, 1976 (46 y.o. F) Treating RN: [name] Primary Care Physician: [name] Other Clinician: Referring Physician: Raelene [name] Treating Physician/Extender: [name] in Treatment: 35 Education Assessment Education Provided To: Patient Education Topics Provided Wound Debridement: Handouts: Wound Debridement Methods: Explain/Verbal Responses: State content correctly Wound/Skin Impairment: Handouts: Caring for Your Ulcer Methods: Explain/Verbal Responses: State content correctly Electronic Signature(s) Signed: 12/08/2021 5:33:03 PM By: [name] Entered By: [name] on 12/08/2021 12:05:31 Heather Dear A. ([id number]) -------------------------------------------------------------------------------- Wound Assessment Details Patient Name: Heather Dear A. Date of Service: 12/08/2021 10:30 AM Medical Record Number: [id number] Patient Account Number: [id number] Date of Birth/Sex: [birth year] (46 y.o. F) Treating RN: [name] Primary Care Kylar Leonhardt: [name] Other Clinician: Referring Nicholos Aloisi: [name] Treating Rockney Grenz/Extender: [name] in Treatment: 37 Wound Status Wound Number: 10 Primary Diabetic Wound/Ulcer of the Lower Extremity Etiology: Wound Location: Right, Midline Lower Leg Wound Healed - Epithelialized Wounding Event: Gradually Appeared Status: Date Acquired: 11/04/2021 Comorbid Chronic sinus problems/congestion, Middle ear problems, Weeks Of Treatment: 4 History: Anemia, Chronic Obstructive Pulmonary Disease (COPD), Clustered Wound: No Congestive Heart Failure, Type II Diabetes, End Stage Renal Disease,  History of pressure wounds, Neuropathy Photos Wound Measurements Length: (cm) 0 % R Width: (cm) 0 % R Depth: (cm) 0 Epi Area: (cm) 0 Tu Volume: (cm) 0 Un eduction in Area: 100% eduction in Volume: 100% thelialization:  Large (67-100%) nneling: No dermining: No Wound Description Classification: Grade 1 Fou Exudate Amount: None Present Slo l Odor After Cleansing: No ugh/Fibrino No Wound Bed Granulation Amount: None Present (0%) Exposed Structure Necrotic Amount: None Present (0%) Fat Layer (Subcutaneous Tissue) Exposed: Yes Treatment Notes Wound #10 (Lower Leg) Wound Laterality: Right, Midline Cleanser Peri-Wound Care Topical Primary Dressing Secondary Dressing Secured With Compression Wrap [name], [name] ([id number]) Compression Stockings Add-Ons Electronic Signature(s) Signed: 12/08/2021 5:33:03 PM By: [name] Entered By: [name] on 12/08/2021 11:21:13 [name], [name] ([id number]) -------------------------------------------------------------------------------- Wound Assessment Details Patient Name: Heather Dear A. Date of Service: 12/08/2021 10:30 AM Medical Record Number: [id number] Patient Account Number: [id number] Date of Birth/Sex: 09/26/1976 (46 y.o. F) Treating RN: [name] Primary Care Kimyatta Lecy: [name] Other Clinician: Referring Belford Pascucci: [name] Treating Lajuanda Penick/Extender: [name] in Treatment: 37 Wound Status Wound Number: 7 Primary Diabetic Wound/Ulcer of the Lower Extremity Etiology: Wound Location: Left, Midline, Plantar Foot Wound Open Wounding Event: Gradually Appeared Status: Date Acquired: 08/07/2020 Comorbid Chronic sinus problems/congestion, Middle ear problems, Weeks Of Treatment: 37 History: Anemia, Chronic Obstructive Pulmonary Disease (COPD), Clustered Wound: No Congestive Heart Failure, Type II Diabetes, End Stage Renal Disease, History of pressure wounds,  Neuropathy Photos Wound Measurements Length: (cm) 4.2 Width: (cm) 1.2 Depth: (cm) 0.3 Area: (cm) 3.958 Volume: (cm) 1.188 % Reduction in Area: -48.2% % Reduction in Volume: 11% Epithelialization: Small (1-33%) Tunneling: No Undermining: No Wound Description Classification: Grade 2 Wound Margin: Thickened Exudate Amount: Large Exudate Type: Serosanguineous Exudate Color: red, brown Foul Odor After Cleansing: No Slough/Fibrino Yes Wound Bed Granulation Amount: Large (67-100%) Exposed Structure Granulation Quality: Red, Pink, Hyper-granulation Fascia Exposed: No Necrotic Amount: Small (1-33%) Fat Layer (Subcutaneous Tissue) Exposed: Yes Necrotic Quality: Adherent Slough Tendon Exposed: No Muscle Exposed: No Joint Exposed: No Bone Exposed: No Treatment Notes Wound #7 (Foot) Wound Laterality: Plantar, Left, Midline Cleanser Dakin 16 (oz) 0.25 Discharge Instruction: Use as directed. [name], [name] ([id number]) Peri-Wound Care Topical Primary Dressing Gauze Discharge Instruction: As directed: moistened with Dakins Solution Secondary Dressing Kerlix 4.5 x 4.1 (in/yd) Discharge Instruction: Apply Kerlix 4.5 x 4.1 (in/yd) as instructed Secured With [employer] Sterile or Non-Sterile 6-ply 4.5x4 (yd/yd) Discharge Instruction: Apply Kerlix as directed Compression Wrap Compression Stockings Add-Ons Electronic Signature(s) Signed: 12/08/2021 5:33:03 PM By: [name] Entered By: [name] on 12/08/2021 10:56:36 [name], [name] ([id number]) -------------------------------------------------------------------------------- Wound Assessment Details Patient Name: Heather Dear A. Date of Service: 12/08/2021 10:30 AM Medical Record Number: [id number] Patient Account Number: [id number] Date of Birth/Sex: Jan 02, 1976 (46 y.o. F) Treating RN: [name] Primary Care Ambrea Hegler: [name] Other Clinician: Referring Laira Penninger: [name] Treating  Carmin Alvidrez/Extender: [name] in Treatment: 37 Wound Status Wound Number: 9 Primary Diabetic Wound/Ulcer of the Lower Extremity Etiology: Wound Location: Right Calcaneus Wound Open Wounding Event: Gradually Appeared Status: Date Acquired: 07/18/2021 Comorbid Chronic sinus problems/congestion, Middle ear problems, Weeks Of Treatment: 20 History: Anemia, Chronic Obstructive Pulmonary Disease (COPD), Clustered Wound: No Congestive Heart Failure, Type II Diabetes, End Stage Renal Disease, History of pressure wounds, Neuropathy Photos Wound Measurements Length: (cm) 0.9 Width: (cm) 1 Depth: (cm) 1.9 Area: (cm) 0.707 Volume: (cm) 1.343 % Reduction in Area: -114.2% % Reduction in Volume: -481.4% Epithelialization: None Tunneling: No Undermining: No Wound Description Classification: Grade 1 Wound Margin: Well defined, not attached Exudate Amount: Medium Exudate Type: Serosanguineous Exudate Color: red, brown Foul Odor After Cleansing: No Slough/Fibrino Yes Wound Bed Granulation Amount: Small (1-33%) Exposed Structure Granulation Quality: Pink  Fascia Exposed: No Necrotic Amount: Large (67-100%) Fat Layer (Subcutaneous Tissue) Exposed: Yes Necrotic Quality: Adherent Slough Tendon Exposed: No Muscle Exposed: No Joint Exposed: No Bone Exposed: No Treatment Notes Wound #9 (Calcaneus) Wound Laterality: Right Cleanser Soap and Water Discharge Instruction: Gently cleanse wound with antibacterial soap, rinse and pat dry prior to dressing wounds Houdeshell, [name] ([id number]) Peri-Wound Care Topical Primary Dressing Gauze Discharge Instruction: As directed: moistened moistened with Dakins Solution Secondary Dressing Gauze Discharge Instruction: As directed: dry Kerlix 4.5 x 4.1 (in/yd) Discharge Instruction: Apply Kerlix 4.5 x 4.1 (in/yd) as instructed Secured With 76M Medipore H Soft Cloth Surgical Tape, 2x2 (in/yd) coverlet Compression Wrap Compression  Stockings Add-Ons Electronic Signature(s) Signed: 12/08/2021 5:33:03 PM By: [name] Entered By: [name] on 12/08/2021 11:18:06 [name] ([id number]) -------------------------------------------------------------------------------- Vitals Details Patient Name: Heather Dear A. Date of Service: 12/08/2021 10:30 AM Medical Record Number: [id number] Patient Account Number: [id number] Date of Birth/Sex: [birth year] (46 y.o. F) Treating RN: [name] Primary Care Aven Cegielski: [name] Other Clinician: Referring Kanton Kamel: [name] Treating Lolitha Tortora/Extender: [name] in Treatment: 37 Vital Signs Time Taken: 10:41 Temperature (F): 97.5 Height (in): 69 Pulse (bpm): 103 Weight (lbs): 185 Respiratory Rate (breaths/min): 18 Body Mass Index (BMI): 27.3 Blood Pressure (mmHg): 125/79 Reference Range: 80 - 120 mg / dl Electronic Signature(s) Signed: 12/08/2021 5:33:03 PM By: [name] Entered By: [name] on 12/08/2021 10:42:22

## 2021-12-09 IMAGING — CT CT FOOT*L* W/O CM
2 series · 12 of 29 positions shown, 15 images · non-contrast
Comparison: X-ray 03/24/2021

CLINICAL DATA: Chronic foot ulceration. History transmetatarsal
amputation in Tuesday March, 2020

EXAM:
CT OF THE LEFT FOOT WITHOUT CONTRAST
TECHNIQUE: Multidetector CT imaging of the left foot was performed according to
the standard protocol. Multiplanar CT image reconstructions were
also generated.

[Series 7: cor st · axial · 0.21mm/px · z∈[-337,-214]mm · 7 of 184 slices shown, 9 images]
[im 15/184  soft-tissue]
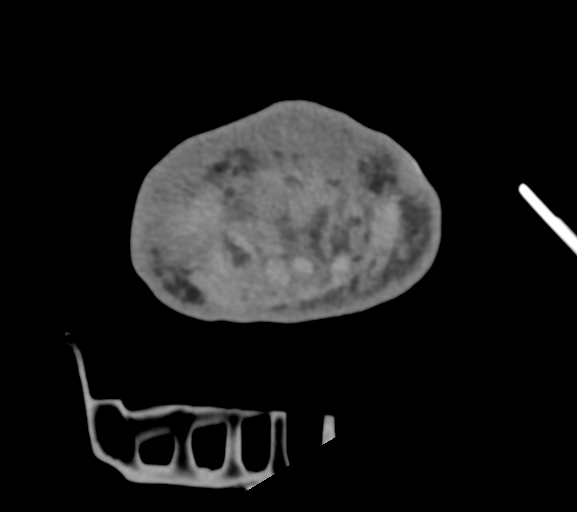
[im 15/184  bone]
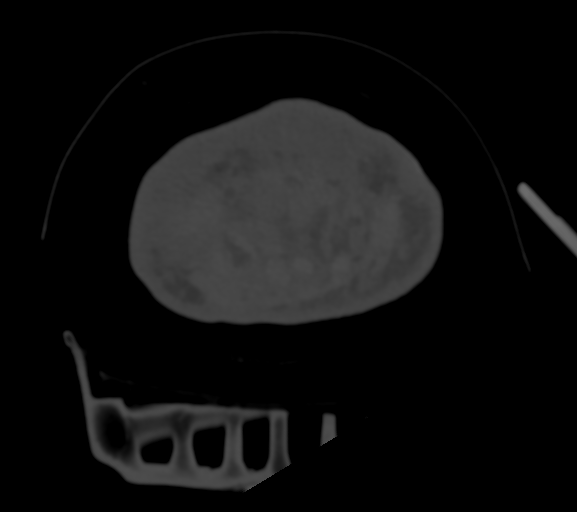
[im 43/184  bone]
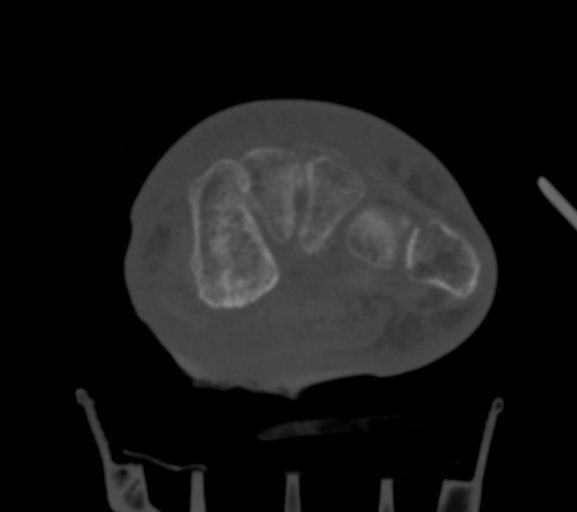
[im 71/184  bone]
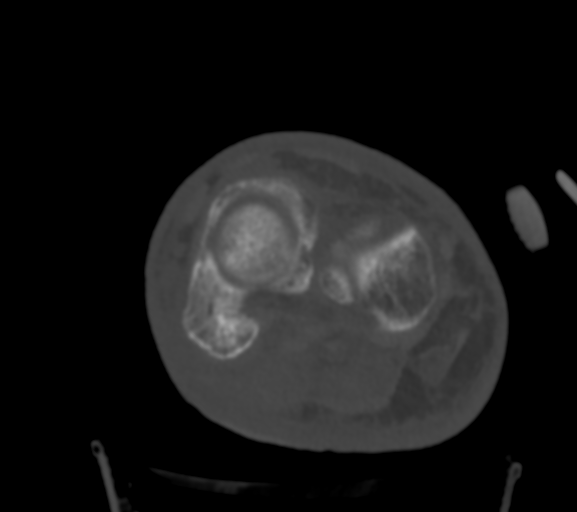
[im 99/184  bone]
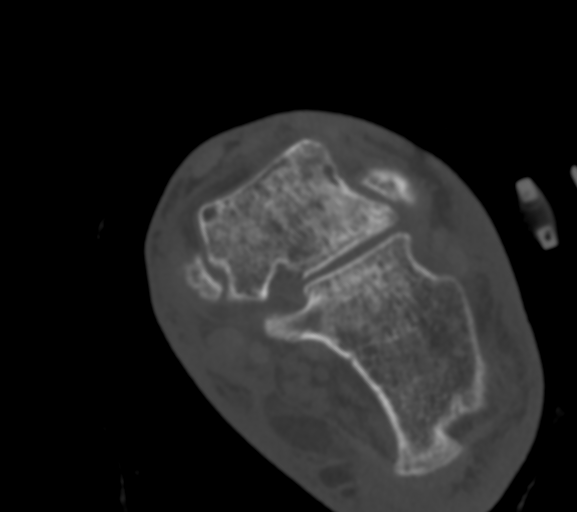
[im 113/184  soft-tissue]
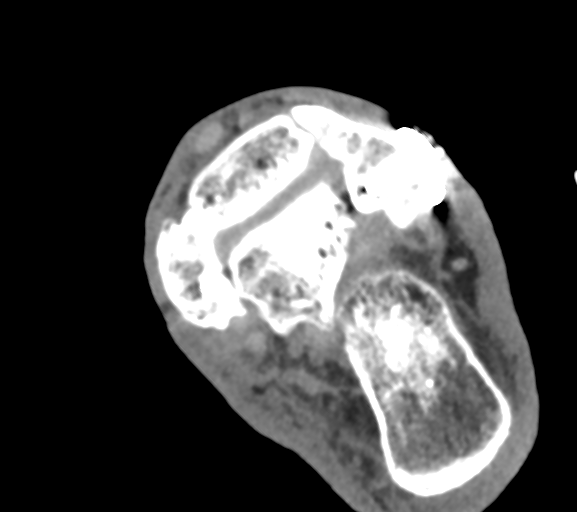
[im 113/184  bone]
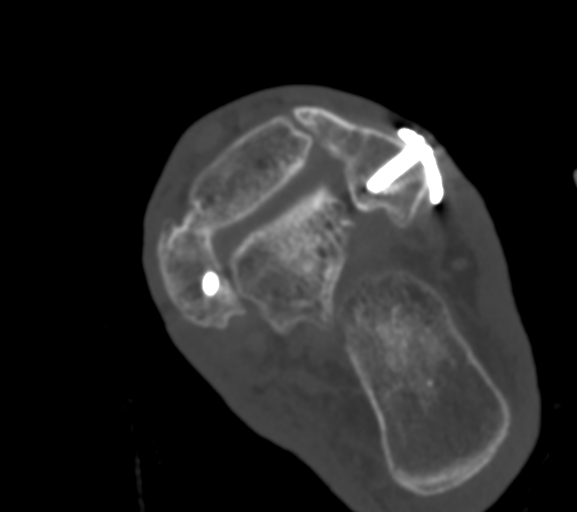
[im 141/184  bone]
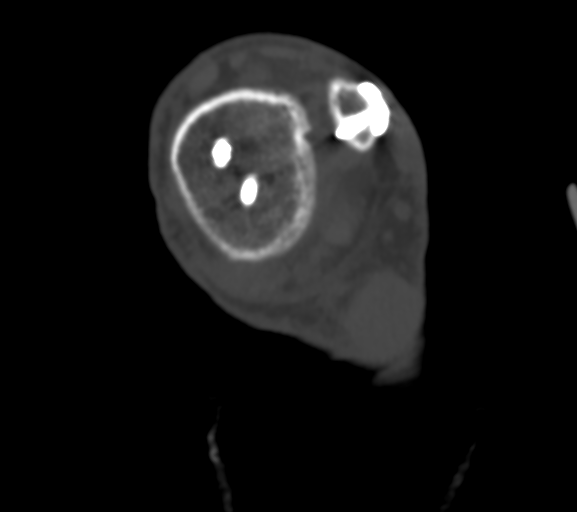
[im 169/184  bone]
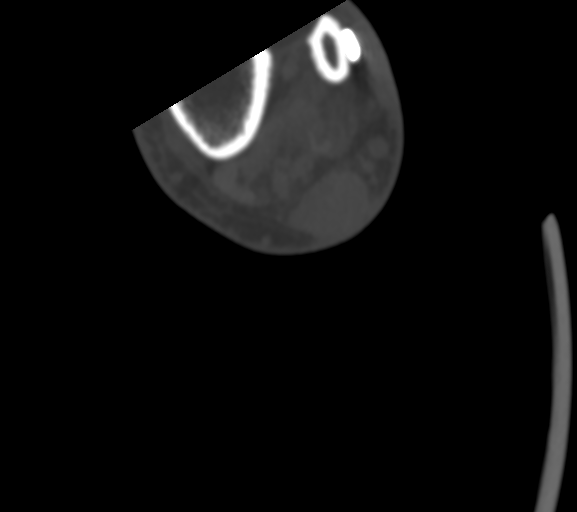

[Series 9: sag st · sagittal · 0.28mm/px · 5 of 82 slices shown, 6 images]
[im 28/82  bone]
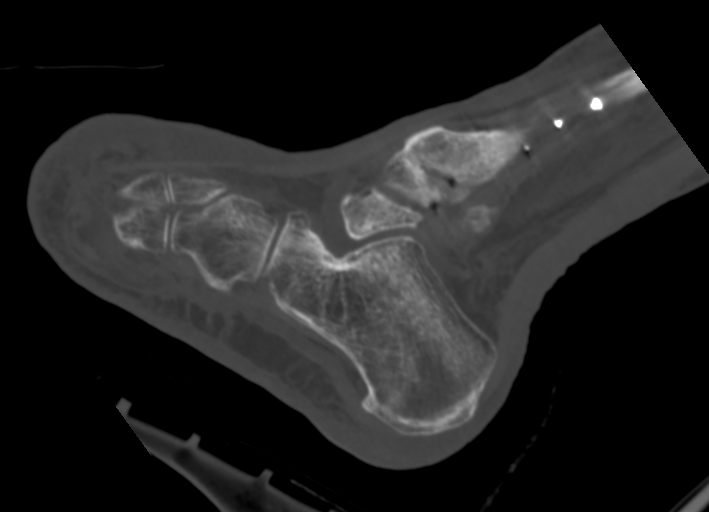
[im 34/82  bone]
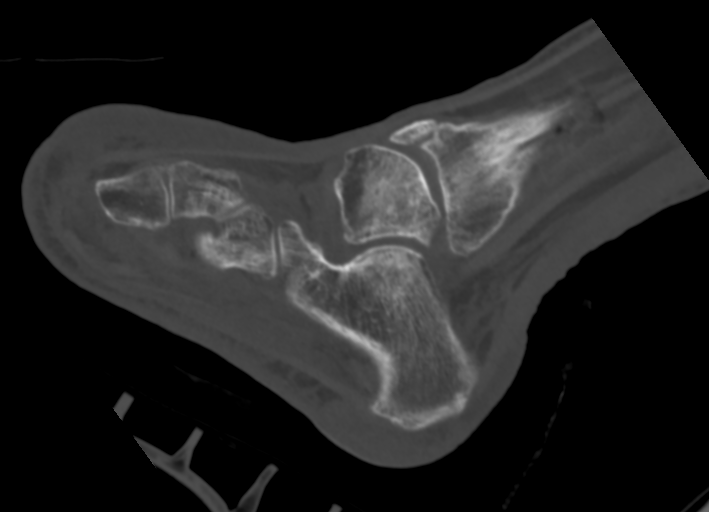
[im 41/82  soft-tissue]
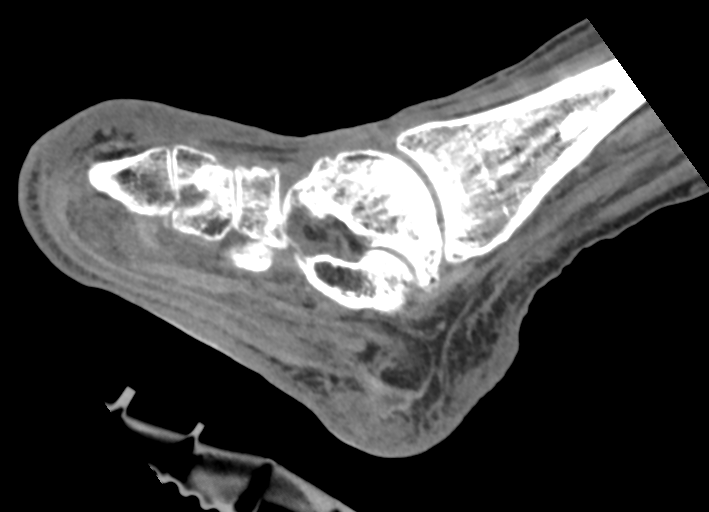
[im 41/82  bone]
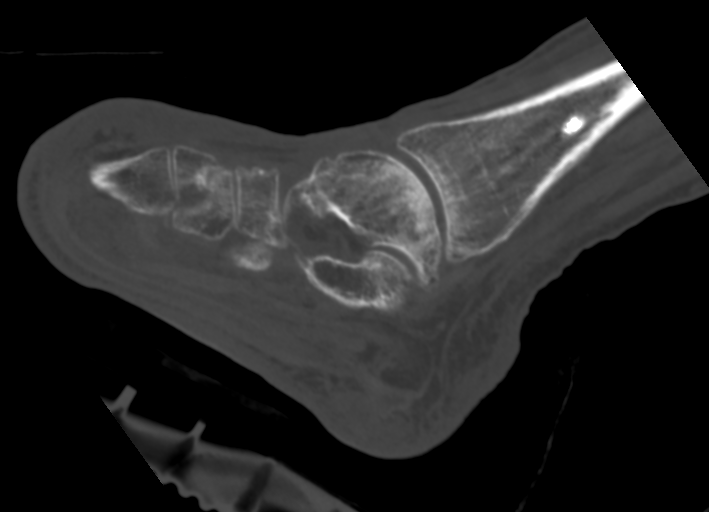
[im 48/82  bone]
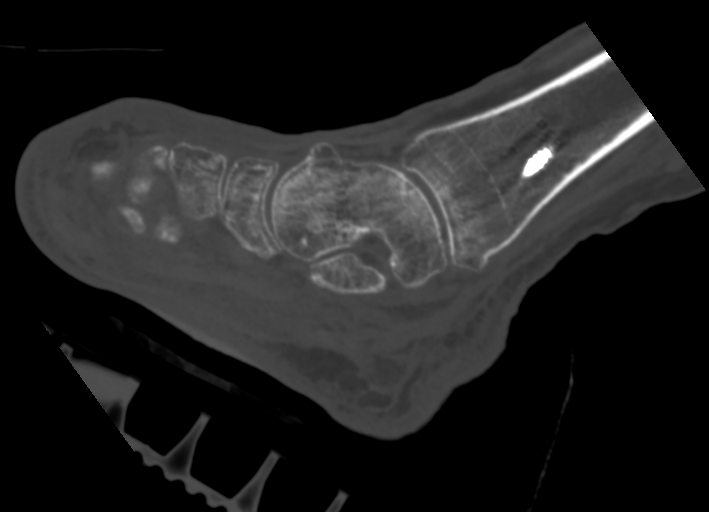
[im 55/82  bone]
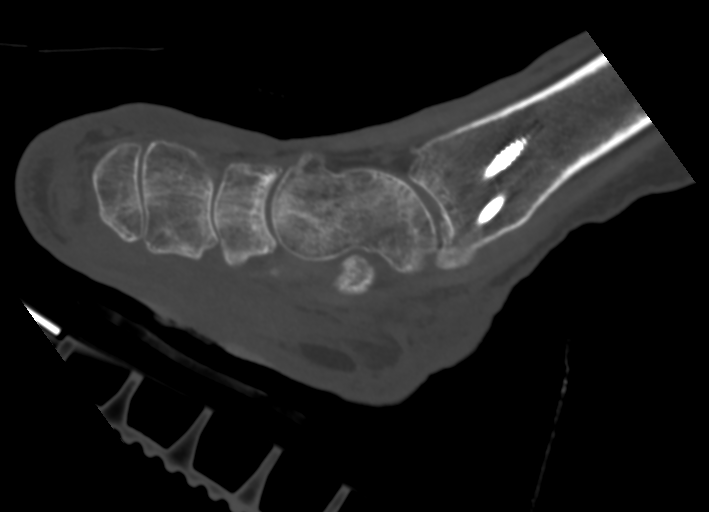

[12 of 29 positions shown; findings below may reference images not displayed]

FINDINGS: Bones/Joint/Cartilage

Postoperative changes status post transmetatarsal amputation of the
first through fifth rays at the level of the proximal metadiaphyses.
The resection margin of each ray appears sharp without discrete
erosion or bony destruction to suggest osteomyelitis.

Distal fibular and medial malleolar fixation hardware without
residual fracture lucency. Mild-moderate tibiotalar osteoarthritis.
No appreciable tibiotalar joint effusion. Subtalar joints are within
normal limits. No acute fracture or malalignment. Partially fused
accessory navicular. No focal bone erosion or periosteal elevation
identified involving the hindfoot or midfoot. Bones are
demineralized.

Ligaments

Suboptimally assessed by CT.

Muscles and Tendons

Fatty atrophy of the lower leg and foot musculature, likely related
to chronic denervation. Amputation changes of the flexor and
extensor tendons of the foot.

Soft tissues

Superficial soft tissue ulceration the plantar aspect the midfoot
underlying the medial naviculocuneiform joint. Ulcer base measures
approximately 2.3 x 1.9 cm in diameter (series 9, image 62). There
is surrounding soft tissue swelling with edema and fluid. No
organized fluid collections are evident. No soft tissue gas.
Subcutaneous edema and fluid is also present over the dorsal aspect
of the foot and overlying the distal stump.
IMPRESSION: 1. Superficial soft tissue ulceration located at the plantar aspect
of the midfoot underlying the medial aspect of the naviculocuneiform
joint. There is surrounding soft tissue swelling with edema and
fluid suggesting cellulitis. No organized fluid collections are
evident. No soft tissue gas. No destructive changes within the
subjacent bony structures to suggest osteomyelitis.
2. Postoperative changes status post transmetatarsal amputation of
the first through fifth rays at the level of the proximal
metadiaphyses. The resection margin of each ray appears sharp
without discrete erosion or bony destruction to suggest
osteomyelitis.

## 2021-12-10 ENCOUNTER — Other Ambulatory Visit: Payer: Self-pay | Admitting: Internal Medicine

## 2021-12-10 DIAGNOSIS — L97512 Non-pressure chronic ulcer of other part of right foot with fat layer exposed: Secondary | ICD-10-CM

## 2021-12-15 ENCOUNTER — Other Ambulatory Visit: Payer: Self-pay

## 2021-12-15 ENCOUNTER — Encounter (HOSPITAL_BASED_OUTPATIENT_CLINIC_OR_DEPARTMENT_OTHER): Payer: Medicaid Other | Admitting: Internal Medicine

## 2021-12-15 DIAGNOSIS — L97512 Non-pressure chronic ulcer of other part of right foot with fat layer exposed: Secondary | ICD-10-CM

## 2021-12-15 DIAGNOSIS — L97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ity: Secondary | ICD-10-CM

## 2021-12-15 DIAGNOSIS — L03115 Cellulitis of right lower limb: Secondary | ICD-10-CM | POA: Diagnosis not present

## 2021-12-15 DIAGNOSIS — E11621 Type 2 diabetes mellitus with foot ulcer: Secondary | ICD-10-CM | POA: Diagnosis not present

## 2021-12-15 NOTE — Progress Notes (Signed)
ANSLEIGH, SAFER ([id number]) Visit Report for 12/15/2021 Arrival Information Details Patient Name: [name], [name] Date of Service: 12/15/2021 10:30 AM Medical Record Number: [id number] Patient Account Number: [id number] Date of Birth/Sex: Mar 29, 1976 (46 y.o. F) Treating RN: Levora Dredge Primary Care Wisdom Rickey: Raelene Bott Other Clinician: Referring Shekinah Pitones: Raelene Bott Treating Samul Mcinroy/Extender: Yaakov Guthrie in Treatment: 44 Visit Information History Since Last Visit Added or deleted any medications: No Patient Arrived: Wheel Chair Any new allergies or adverse reactions: No Arrival Time: 10:29 Had a fall or experienced change in Yes Accompanied By: mother activities of daily living that may affect Transfer Assistance: EasyPivot Patient Lift risk of falls: Patient Identification Verified: Yes Hospitalized since last visit: No Secondary Verification Process Completed: Yes Has Dressing in Place as Prescribed: Yes Patient Requires Transmission-Based No Pain Present Now: Yes Precautions: Patient Has Alerts: Yes Patient Alerts: Patient on Blood Thinner 14m apirin Diabetic Type II Electronic Signature(s) Signed: 12/15/2021 4:28:37 PM By: GLevora DredgeEntered By: GLevora Dredgeon 12/15/2021 10:32:48 SLonzo Candy([id number] -------------------------------------------------------------------------------- Clinic Level of Care Assessment Details Patient Name: [name] Date of Service: 12/15/2021 10:30 AM Medical Record Number: [id number] Account Number: [id number] of Birth/Sex: 9Dec 25, 1977(46y.o. F) Treating RN: GLevora DredgePrimary Care Tenille Morrill: HRaelene BottOther Clinician: Referring Alyzae Hawkey: HRaelene BottTreating Marcio Hoque/Extender: HYaakov Guthriein Treatment: 348Clinic Level of Care Assessment Items TOOL 4 Quantity Score _0  - Use when only an EandM is performed on FOLLOW-UP visit 0 ASSESSMENTS - Nursing  Assessment / Reassessment _1  - Reassessment of Co-morbidities (includes updates in patient status) 0 X- 1 5 Reassessment of Adherence to Treatment Plan ASSESSMENTS - Wound and Skin Assessment / Reassessment _2  - Simple Wound Assessment / Reassessment - one wound 0 X- 2 5 Complex Wound Assessment / Reassessment - multiple wounds _3  - 0 Dermatologic / Skin Assessment (not related to wound area) ASSESSMENTS - Focused Assessment _4  - Circumferential Edema Measurements - multi extremities 0 _5  - 0 Nutritional Assessment / Counseling / Intervention _6  - 0 Lower Extremity Assessment (monofilament, tuning fork, pulses) _7  - 0 Peripheral Arterial Disease Assessment (using hand held doppler) ASSESSMENTS - Ostomy and/or Continence Assessment and Care _8  - Incontinence Assessment and Management 0 _9  - 0 Ostomy Care Assessment and Management (repouching, etc.) PROCESS - Coordination of Care X - Simple Patient / Family Education for ongoing care 1 15 _10  - 0 Complex (extensive) Patient / Family Education for ongoing care _11  - 0 Staff obtains CProgrammer, systems Records, Test Results / Process Orders _12  - 0 Staff telephones HHA, Nursing Homes / Clarify orders / etc _13  - 0 Routine Transfer to another Facility (non-emergent condition) _14  - 0 Routine Hospital Admission (non-emergent condition) _15  - 0 New Admissions / IBiomedical engineer/ Ordering NPWT, Apligraf, etc. _16  - 0 Emergency Hospital Admission (emergent condition) X- 1 10 Simple Discharge Coordination _17  - 0 Complex (extensive) Discharge Coordination PROCESS - Special Needs _18  - Pediatric / Minor Patient Management 0 _19  - 0 Isolation Patient Management _20  - 0 Hearing / Language / Visual special needs _21  - 0 Assessment of Community assistance (transportation, D/C planning, etc.) _22  - 0 Additional assistance / Altered mentation _23  - 0 Support Surface(s) Assessment (bed, cushion, seat, etc.) INTERVENTIONS - Wound Cleansing /  Measurement [name], [name] ([id number] _24  - 0 Simple Wound Cleansing - one wound X- 2 5 Complex Wound Cleansing - multiple wounds X- 1 5 Wound Imaging (photographs - any number of wounds) _25  - 0 Wound  Tracing (instead of photographs) _0  - 0 Simple Wound Measurement - one wound X- 2 5 Complex Wound Measurement - multiple wounds INTERVENTIONS - Wound Dressings _1  - Small Wound Dressing one or multiple wounds 0 X- 2 15 Medium Wound Dressing one or multiple wounds _2  - 0 Large Wound Dressing one or multiple wounds <[id number]>_9<\/ERDEYCXKGYJEHUDJ>_4  - 0 Application of Medications - topical <HFWYOVZCHYIFOYDX>_4<\/JOINOMVEHMCNOBSJ>_6  - 0 Application of Medications - injection INTERVENTIONS - Miscellaneous _5  - External ear exam 0 _6  - 0 Specimen Collection (cultures, biopsies, blood, body fluids, etc.) _7  - 0 Specimen(s) / Culture(s) sent or taken to Lab for analysis _8  - 0 Patient Transfer (multiple staff / Civil Service fast streamer / Similar devices) _9  - 0 Simple Staple / Suture removal (25 or less) _10  - 0 Complex Staple / Suture removal (26 or more) _11  - 0 Hypo / Hyperglycemic Management (close monitor of Blood Glucose) _12  - 0 Ankle / Brachial Index (ABI) - do not check if billed separately X- 1 5 Vital Signs Has the patient been seen at the hospital within the last three years: Yes Total Score: 100 Level Of Care: New/Established - Level 3 Electronic Signature(s) Signed: 12/15/2021 4:28:37 PM By: Levora Dredge Entered By: Levora Dredge on 12/15/2021 13:01:13 [name] ([id number]) -------------------------------------------------------------------------------- Encounter Discharge Information Details Patient Name: Heather Dear A. Date of Service: 12/15/2021 10:30 AM Medical Record Number: [id number] Patient Account Number: [id number] Date of Birth/Sex: [birth year] (45 y.o. F) Treating RN: Levora Dredge Primary Care Delynn Olvera: Raelene Bott Other Clinician: Referring Analleli Gierke: Raelene Bott Treating Kholton Coate/Extender: Yaakov Guthrie in Treatment: 52 Encounter Discharge Information Items Discharge Condition: Stable Ambulatory Status: Wheelchair Discharge Destination: Emergency Room Telephoned: No Orders Sent: No Transportation: Private Auto Accompanied By: mother Schedule Follow-up Appointment: Yes Clinical Summary of Care: Notes Mother stated she was going to take patient to [name]. Patient was advised to go to ED right from visit at wound care center. Electronic Signature(s) Signed: 12/15/2021 1:04:04 PM By: Levora Dredge Entered By: Levora Dredge on 12/15/2021 13:04:04 [name] ([id number]) -------------------------------------------------------------------------------- Lower Extremity Assessment Details Patient Name: Heather Dear A. Date of Service: 12/15/2021 10:30 AM Medical Record Number: [id number] Patient Account Number: [id number] Date of Birth/Sex: [birth year] (46 y.o. F) Treating RN: Levora Dredge Primary Care Morghan Kester: Raelene Bott Other Clinician: Referring Tinley Rought: Raelene Bott Treating Laketta Soderberg/Extender: Yaakov Guthrie in Treatment: 38 Edema Assessment Assessed: [Left: No] [Right: No] Edema: [Left: Yes] [Right: Yes] Calf Left: Right: Point of Measurement: 30 cm From Medial Instep 28.2 cm 31 cm Ankle Left: Right: Point of Measurement: 10 cm From Medial Instep 18.4 cm 22 cm Vascular Assessment Pulses: Dorsalis Pedis Palpable: [Left:Yes] [Right:Yes] Posterior Tibial Palpable: [Left:Yes] [Right:Yes] Electronic Signature(s) Signed: 12/15/2021 4:28:37 PM By: Levora Dredge Entered By: Levora Dredge on 12/15/2021 10:52:11 [name], [name] ([id number]) -------------------------------------------------------------------------------- Multi Wound Chart Details Patient Name: Heather Dear A. Date of Service: 12/15/2021 10:30 AM Medical Record Number: [id number] Patient Account Number: [id number] Date of Birth/Sex: [birth year] (46 y.o.  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 Extremity Comorbid History: Chronic sinus problems/congestion, Chronic sinus problems/congestion, N/A Middle ear problems, Anemia,  Middle ear problems, Anemia, Chronic Obstructive Pulmonary Chronic Obstructive Pulmonary Disease (COPD), Congestive Heart Disease (COPD), Congestive Heart Failure, Type II Diabetes, End Stage Failure, Type II Diabetes, End Stage Renal Disease, History of pressure Renal Disease, History of pressure wounds, Neuropathy wounds, Neuropathy Date Acquired: 08/07/2020 07/18/2021 N/A Weeks of Treatment: 38 21 N/A Wound Status: Open Open N/A Wound Recurrence: No No N/A Measurements L x W x D (cm) 4x1.4x0.3 1x1x1.9 N/A Area (cm) : 4.398 0.785 N/A Volume (cm) : 1.319 1.492 N/A % Reduction in Area: -64.70% -137.90% N/A % Reduction in Volume: 1.20% -545.90% N/A Classification: Grade 2 Grade 1 N/A Exudate Amount: Large Medium N/A Exudate Type: Serosanguineous Serosanguineous N/A Exudate Color: red, brown red, brown N/A Wound Margin: Thickened Well defined, not attached N/A Granulation Amount: Large (67-100%) Small (1-33%) N/A Granulation Quality: Red, Pink, Hyper-granulation Pink N/A Necrotic Amount: Small (1-33%) Large (67-100%) N/A Exposed Structures: Fat Layer (Subcutaneous Tissue): Fat Layer (Subcutaneous Tissue): N/A Yes Yes Fascia: No Fascia: No Tendon:  No Tendon: No Muscle: No Muscle: No Joint: No Joint: No Bone: No Bone: No Epithelialization: Small (1-33%) None N/A Treatment Notes Electronic Signature(s) IRVA, LOSER ([id number]) Signed: 12/15/2021 12:57:49 PM By: Levora Dredge Entered By: Levora Dredge on 12/15/2021 12:57:49 [name] ([id number]) -------------------------------------------------------------------------------- Geraldine Details Patient Name: Heather Dear A. Date of Service: 12/15/2021 10:30 AM Medical Record Number: [id number] Patient Account Number: [id number] Date of Birth/Sex: 15-Sep-1976 (46 y.o. F) Treating RN: Levora Dredge Primary Care Marialy Urbanczyk: Raelene Bott Other Clinician: Referring Altus Zaino: Raelene Bott Treating Imelda Dandridge/Extender: Yaakov Guthrie in Treatment: 43 Active Inactive Necrotic Tissue Nursing Diagnoses: Impaired tissue integrity related to necrotic/devitalized tissue Knowledge deficit related to management of necrotic/devitalized tissue Goals: Necrotic/devitalized tissue will be minimized in the wound bed Date Initiated: 04/07/2021 Target Resolution Date: 10/08/2021 Goal Status: Active Patient/caregiver will verbalize understanding of reason and process for debridement of necrotic tissue Date Initiated: 04/07/2021 Date Inactivated: 09/22/2021 Target Resolution Date: 09/08/2021 Goal Status: Met Interventions: Assess patient pain level pre-, during and post procedure and prior to discharge Provide education on necrotic tissue and debridement process Treatment Activities: Excisional debridement : 04/07/2021 Notes: Electronic Signature(s) Signed: 12/15/2021 12:57:36 PM By: Levora Dredge Entered By: Levora Dredge on 12/15/2021 12:57:35 [name], [name] ([id number]) -------------------------------------------------------------------------------- Pain Assessment Details Patient Name: Heather Dear A. Date of Service: 12/15/2021 10:30  AM Medical Record Number: [id number] Patient Account Number: [id number] Date of Birth/Sex: 10/09/1976 (46 y.o. F) Treating RN: Levora Dredge Primary Care Wendie Diskin: Raelene Bott Other Clinician: Referring Emiley Digiacomo: Raelene Bott Treating Odessa Morren/Extender: Yaakov Guthrie in Treatment: 22 Active Problems Location of Pain Severity and Description of Pain Patient Has Paino Yes Site Locations Rate the pain. Current Pain Level: 10 Pain Management and Medication Current Pain Management: Notes pt states pain at both wound sites, pt states recent fall over the weekend, pt states did not hit head Electronic Signature(s) Signed: 12/15/2021 4:28:37 PM By: Levora Dredge Entered By: Levora Dredge on 12/15/2021 10:35:18 [name] ([id number]) -------------------------------------------------------------------------------- Patient/Caregiver Education Details Patient Name: Heather Dear A. Date of Service: 12/15/2021 10:30 AM Medical Record Number: [id number] Patient Account Number: [id number] Date of Birth/Gender: [birth year] (46 y.o. F) Treating RN: Levora Dredge Primary Care Physician: Raelene Bott Other Clinician: Referring Physician: Raelene Bott Treating Physician/Extender: Yaakov Guthrie in Treatment: 35 Education Assessment Education Provided To: Patient Education Topics Provided Wound/Skin Impairment: Handouts: Caring for Your Ulcer Methods: Explain/Verbal Responses: State content correctly Electronic Signature(s) Signed: 12/15/2021 4:28:37 PM By: Levora Dredge Entered By: Araceli Bouche,  Caitlin on 12/15/2021 13:01:49 [name], [name] ([id number]) -------------------------------------------------------------------------------- Wound Assessment Details Patient Name: [name], [name] Date of Service: 12/15/2021 10:30 AM Medical Record Number: [id number] Patient Account Number: [id number] Date of Birth/Sex: 03/31/1976 (46 y.o. F) Treating RN: Levora Dredge Primary Care Vern Guerette: Raelene Bott Other Clinician: Referring Erlinda Solinger: Raelene Bott Treating Jaella Weinert/Extender: Yaakov Guthrie in Treatment: 38 Wound Status Wound Number: 7 Primary Diabetic Wound/Ulcer of the Lower Extremity Etiology: Wound Location: Left, Midline, Plantar Foot Wound Open Wounding Event: Gradually Appeared Status: Date Acquired: 08/07/2020 Comorbid Chronic sinus problems/congestion, Middle ear problems, Weeks Of Treatment: 38 History: Anemia, Chronic Obstructive Pulmonary Disease (COPD), Clustered Wound: No Congestive Heart Failure, Type II Diabetes, End Stage Renal Disease, History of pressure wounds, Neuropathy Photos Wound Measurements Length: (cm) 4 Width: (cm) 1.4 Depth: (cm) 0.3 Area: (cm) 4.398 Volume: (cm) 1.319 % Reduction in Area: -64.7% % Reduction in Volume: 1.2% Epithelialization: Small (1-33%) Tunneling: No Undermining: No Wound Description Classification: Grade 2 Wound Margin: Thickened Exudate Amount: Large Exudate Type: Serosanguineous Exudate Color: red, brown Foul Odor After Cleansing: No Slough/Fibrino Yes Wound Bed Granulation Amount: Large (67-100%) Exposed Structure Granulation Quality: Red, Pink, Hyper-granulation Fascia Exposed: No Necrotic Amount: Small (1-33%) Fat Layer (Subcutaneous Tissue) Exposed: Yes Necrotic Quality: Adherent Slough Tendon Exposed: No Muscle Exposed: No Joint Exposed: No Bone Exposed: No Treatment Notes Wound #7 (Foot) Wound Laterality: Plantar, Left, Midline Cleanser Dakin 16 (oz) 0.25 Discharge Instruction: Use as directed. [name], [name] ([id number]) Peri-Wound Care Topical Primary Dressing Gauze Discharge Instruction: As directed: moistened with Dakins Solution Secondary Dressing Kerlix 4.5 x 4.1 (in/yd) Discharge Instruction: Apply Kerlix 4.5 x 4.1 (in/yd) as instructed Secured With 27M Medipore H Soft Cloth Surgical Tape, 2x2 (in/yd) Compression  Wrap Compression Stockings Add-Ons Electronic Signature(s) Signed: 12/15/2021 4:28:37 PM By: Levora Dredge Entered By: Levora Dredge on 12/15/2021 10:50:20 [name] ([id number]) -------------------------------------------------------------------------------- Wound Assessment Details Patient Name: Heather Dear A. Date of Service: 12/15/2021 10:30 AM Medical Record Number: [id number] Patient Account Number: [id number] Date of Birth/Sex: 02-Jan-1976 (46 y.o. F) Treating RN: Levora Dredge Primary Care Yarelie Hams: Raelene Bott Other Clinician: Referring Neema Fluegge: Raelene Bott Treating Salahuddin Arismendez/Extender: Yaakov Guthrie in Treatment: 38 Wound Status Wound Number: 9 Primary Diabetic Wound/Ulcer of the Lower Extremity Etiology: Wound Location: Right Calcaneus Wound Open Wounding Event: Gradually Appeared Status: Date Acquired: 07/18/2021 Comorbid Chronic sinus problems/congestion, Middle ear problems, Weeks Of Treatment: 21 History: Anemia, Chronic Obstructive Pulmonary Disease (COPD), Clustered Wound: No Congestive Heart Failure, Type II Diabetes, End Stage Renal Disease, History of pressure wounds, Neuropathy Photos Wound Measurements Length: (cm) 1 Width: (cm) 1 Depth: (cm) 1.9 Area: (cm) 0.785 Volume: (cm) 1.492 % Reduction in Area: -137.9% % Reduction in Volume: -545.9% Epithelialization: None Tunneling: No Undermining: No Wound Description Classification: Grade 1 Wound Margin: Well defined, not attached Exudate Amount: Medium Exudate Type: Serosanguineous Exudate Color: red, brown Foul Odor After Cleansing: No Slough/Fibrino Yes Wound Bed Granulation Amount: Small (1-33%) Exposed Structure Granulation Quality: Pink Fascia Exposed: No Necrotic Amount: Large (67-100%) Fat Layer (Subcutaneous Tissue) Exposed: Yes Necrotic Quality: Adherent Slough Tendon Exposed: No Muscle Exposed: No Joint Exposed: No Bone Exposed: No Treatment  Notes Wound #9 (Calcaneus) Wound Laterality: Right Cleanser Dakin 16 (oz) 0.25 Discharge Instruction: Use as directed. [name], [name] ([id number]) Peri-Wound Care Topical Primary Dressing Gauze Discharge Instruction: As directed: moistened with Dakins Solution Secondary Dressing ABD Pad 5x9 (in/in) Discharge Instruction: Cover with ABD pad Kerlix 4.5 x 4.1 (in/yd) Discharge Instruction: Apply Kerlix 4.5 x 4.1 (  in/yd) as instructed Secured With 67M Medipore H Soft Cloth Surgical Tape, 2x2 (in/yd) Compression Wrap Compression Stockings Add-Ons Electronic Signature(s) Signed: 12/15/2021 4:28:37 PM By: Levora Dredge Entered By: Levora Dredge on 12/15/2021 10:51:18 [name] ([id number]) -------------------------------------------------------------------------------- Vitals Details Patient Name: Heather Dear A. Date of Service: 12/15/2021 10:30 AM Medical Record Number: [id number] Patient Account Number: [id number] Date of Birth/Sex: Dec 16, 1975 (46 y.o. F) Treating RN: Levora Dredge Primary Care Asuncion Tapscott: Raelene Bott Other Clinician: Referring Burrell Hodapp: Raelene Bott Treating Stephenia Vogan/Extender: Yaakov Guthrie in Treatment: 35 Vital Signs Time Taken: 10:32 Temperature (F): 97.6 Height (in): 69 Pulse (bpm): 78 Weight (lbs): 185 Respiratory Rate (breaths/min): 18 Body Mass Index (BMI): 27.3 Blood Pressure (mmHg): 96/68 Reference Range: 80 - 120 mg / dl Electronic Signature(s) Signed: 12/15/2021 4:28:37 PM By: Levora Dredge Entered By: Levora Dredge on 12/15/2021 10:34:38

## 2021-12-15 NOTE — Progress Notes (Addendum)
[name], [name] ([id number]) Visit Report for 12/15/2021 Chief Complaint Document Details Patient Name: BREANE, GRUNWALD A. Date of Service: 12/15/2021 10:30 AM Medical Record Number: [id number] Patient Account Number: [id number] Date of Birth/Sex: [birth year] (46 y.o. F) Treating RN: Levora Dredge Primary Care Provider: Raelene Bott Other Clinician: Referring Provider: Raelene Bott Treating Provider/Extender: Yaakov Guthrie in Treatment: 46 Information Obtained from: Patient Chief Complaint 03/19/2021; patient referred by Dr. Luana Shu who has been looking after her left foot for quite a period of time for review of a nonhealing area in the left midfoot Electronic Signature(s) Signed: 12/15/2021 11:18:42 AM By: Kalman Shan DO Entered By: Kalman Shan on 12/15/2021 11:12:31 [name], [name] ([id number]) -------------------------------------------------------------------------------- HPI Details Patient Name: Heather Dear A. Date of Service: 12/15/2021 10:30 AM Medical Record Number: [id number] Patient Account Number: [id number] Date of Birth/Sex: 01-26-1976 (46 y.o. F) Treating RN: Levora Dredge Primary Care Provider: Raelene Bott Other Clinician: Referring Provider: Raelene Bott Treating Provider/Extender: Yaakov Guthrie in Treatment: 48 History of Present Illness HPI Description: 01/18/18-She is here for initial evaluation of the left great toe ulcer. She is a poor historian in regards to timeframe in detail. She states approximately 4 weeks ago she lacerated her toe on something in the house. She followed up with her primary care who placed her on Bactrim and ultimately a second dose of Bactrim prior to coming to wound clinic. She states she has been treating the toe with peroxide, Betadine and a Band-Aid. She did not check her blood sugar this morning but checked it yesterday morning it was 327; she is unaware of a recent A1c and there are no current  records. She saw Dr. she would've orthopedics last week for an old injury to the left ankle, she states he did not see her toe, nor did she bring it to his attention. She smokes approximately 1 pack cigarettes a day. Her social situation is concerning, she arrives this morning with her mother who appears extremely intoxicated/under the influence; her mother was asked to leave the room and be monitored by the patient's grandmother. The patient's aunt then accompanied the patient and the room throughout the rest of the appointment. We had a lengthy discussion regarding the deleterious effects of uncontrolled hyperglycemia and smoking as it relates to wound healing and overall health. She was strongly encouraged to decrease her smoking and get her diabetes under better control. She states she is currently on a diet and has cut down her [facility] consumption. The left toe is erythematous, macerated and slightly edematous with malodor present. The edema in her left foot is below her baseline, there is no erythema streaking. We will treat her with Santyl, doxycycline; we have ordered and xray, culture and provided a Peg assist surgical shoe and cultured the wound. 01/25/18-She is here in follow-up evaluation for a left great toe ulcer and presents with an abscess to her suprapubic area. She states her blood sugars remain elevated, feeling "sick" and if levels are below 250, but she is trying. She has made no attempt to decrease her smoking stating that we "can't take away her food in her cigarettes". She has been compliant with offloading using the PEG assist you. She is using Santyl daily. the culture obtained last week grew staph aureus and Enterococcus faecalis; continues on the doxycycline and Augmentin was added on Monday. The suprapubic area has erythema, no femoral variation, purple discoloration, minimal induration, was accessed with a cotton tip applicator with sanguinopurulent drainage, this was  cultured, I suspect the current antibiotic treatment will cover and we will not add anything to her current treatment plan. She was advised to go to urgent care or ER with any change in redness, induration or fever. 02/01/18-She is here in follow-up evaluation for left great toe ulcers and a new abdominal abscess from last week. She was able to use packing until earlier this week, where she "forgot it was there". She states she was feeling ill with GI symptoms last week and was not taking her antibiotic. She states her glucose levels have been predominantly less than 200, with occasional levels between 200-250. She thinks this was contributing to her GI symptoms as they have resolved without intervention. There continues to be significant laceration to left toe, otherwise it clinically looks stable/improved. There is now less superficial opening to the lateral aspect of the great toe that was residual blister. We will transition to [facility] to all wounds, she will continue her Augmentin. If there is no change or deterioration next week for reculture. 02/08/18-She is here in follow-up evaluation for left great toe ulcer and abdominal ulcer. There is an improvement in both wounds. She has been wrapping her left toe with coban, not by our direction, which has created an area of discoloration to the medial aspect; she has been advised to NOT use coban secondary to her neuropathy. She states her glucose levels have been high over this last week ranging from 200-350, she continues to smoke. She admits to being less compliant with her offloading shoe. We will continue with same treatment plan and she will follow-up next week. 02/15/18-She is here in follow-up evaluation for left great toe ulcer and abdominal ulcer. The abdominal ulcer is epithelialized. The left great toe ulcer is improved and all injury from last week using the Coban wrap is resolved, the lateral ulcer is healed. She admits to  noncompliance with wearing offloading shoe and admits to glucose levels being greater than 300 most of the week. She continues to smoke and expresses no desire to quit. There is one area medially that probes deeper than it has historically, erythema to the toe and dorsal foot has consistently waxed and waned. There is no overt signs of cellulitis or infection but we will culture the wound for any occult infection given the new area of depth and erythema. We will hold off on sensitivities for initiation of antibiotic therapy. 02/22/18-She is here in follow up evaluation for left great toe ulcer. There is overall significant improvement in both wound appearance, erythema and edema with changes made last week. She was not initiated on antibiotic therapy. Culture obtained last week showed oxacillin sensitive staph aureus, sensitive to clindamycin. Clindamycin has been called into the pharmacy but she has been instructed to hold off on initiation secondary to overall clinical improvement and her history of antibiotic intolerance. She has been instructed to contact the clinic with any noted changes/deterioration and the wound, erythema, edema and/or pain. She will follow-up next week. She continues to smoke and her glucose levels remain elevated >250; she admits to compliance with offloading shoe 03/01/18 on evaluation today patient appears to be doing fairly well in regard to her left first toe ulcer. She has been tolerating the dressing changes with the [facility] Dressing without complication and overall this has definitely showed signs of improvement according to records as well is what the patient tells me today. I'm very pleased in that regard. She is having no pain today 03/08/18 She  is here for follow up evaluation of a left great toe ulcer. She remains non-compliant with glucose control and smoking cessation; glucose levels consistently >200. She states that she got new shoe inserts/peg assist. She  admits to compliance with offloading. Since my last evaluation there is significant improvement. We will switch to prisma at this time and she will follow up next week. She is noted to be tachycardic at this appointment, heart rate 120s; she has a history of heart rate 70-130 according to our records. She admits to extreme agitation r/t personal issues; she was advised to monitor her heartrate and contact her physician if it does not return to a more normal range (<100). She takes cardizem twice daily. 03/15/18-She is here in follow-up evaluation for left great toe ulcer. She remains noncompliant with glucose control and smoking cessation. She admits to compliance with wearing offloading shoe. The ulcer is improved/stable and we will continue with the same treatment plan and she will follow-up next week 03/22/18-She is here for evaluation for left great toe ulcer. There continues to be significant improvement despite recurrent hyperglycemia (over 500 yesterday) and she continues to smoke. She has been compliant with offloading and we will continue with same treatment plan and she will follow-up next week. 03/29/18-She is here for evaluation for left great toe ulcer. Despite continuing to smoke and uncontrolled diabetes she continues to improve. She is compliant with offloading shoe. We will continue with the same treatment plan and she will follow-up next week 04/05/18- She is here in follow up evaluation for a left great toe ulcer; she presents with small pustule to left fifth toe (resembles ant bite). She admits to compliance with wearing offloading shoe; continues to smoke or have uncontrolled blood glucose control. There is more callus than usual with evidence of bleeding; she denies known trauma. 04/12/18-She is here for evaluation of left great toe ulcer. Despite noncompliance with glycemic control and smoking she continues to make Carfagno, Kaytlin A. ([id number]) improvement. She continues to wear  offloading shoe. The pustule, that was identified last week, to the left fifth toe is resolved. She will follow-up in 2 weeks 05/03/18-she is seen in follow-up evaluation for a left great toe ulcer. She is compliant with offloading, otherwise noncompliant with glycemic control and smoking. She has plateaued and there is minimal improvement noted. We will transition to [facility], replaced the insert to her surgical shoe and she will follow-up in one week 05/10/18- She is here in follow up evaluation for a left great toe ulcer. It appears stable despite measurement change. We will continue with same treatment plan and follow up next week. 05/24/18-She is seen in follow-up evaluation for a left great toe ulcer. She remains compliant with offloading, has made significant improvement in her diet, decreasing the amount of sugar/soda. She said her recent A1c was 10.9 which is lower than. She did see a diabetic nutritionist/educator yesterday. She continues to smoke. We will continue with the same treatment plan and she'll follow-up next week. 05/31/18- She is seen in follow-up evaluation for left great toe ulcer. She continues to remain compliant with offloading, continues to make improvement in her diet, increasing her water and decreasing the amount of sugar/soda. She does continue to smoke with no desire to quit. We will apply Prisma to the depth and Hydrofera Blue over. We have not received insurance authorization for oasis. She will follow up next week. 06/07/18-She is seen in follow-up evaluation for left great toe ulcer. It has  stalled according to today's measurements although base appears stable. She says she saw a diabetic educator yesterday; her average blood sugars are less than 300 which is an improvement for her. She continues to smoke and states "that's my next step" She continues with water over soda. We will order for xray, culture and reinstate ace wrap compression prior to placing apligraf  for next week. She is voicing no complaints or concerns. Her dressing will change to iodoflex over the next week in preparation for apligraf. 06/14/18-She is seen in follow-up evaluation for left great toe ulcer. Plain film x-ray performed last week was negative for osteomyelitis. Wound culture obtained last week grew strep B and OSSA; she is initiated on keflex and cefdinir today; there is erythema to the toe which could be from ace wrap compression, she has a history of wrapping too tight and has has been encouraged to maintain ace wraps that we place today. We will hold off on application of apligraf today, will apply next week after antibiotic therapy has been initiated. She admits today that she has resumed taking a shower with her foot/toe submerged in water, she has been reminded to keep foot/toe out of the bath water. She will be seen in follow up next week 06/21/18-she is seen in follow-up evaluation for left great toe ulcer. She is tolerating antibiotic therapy with no GI disturbance. The wound is stable. Apligraf was applied today. She has been decreasing her smoking, only had 4 cigarettes yesterday and 1 today. She continues being more compliant in diabetic diet. She will follow-up next week for evaluation of site, if stable will remove at 2 weeks. 06/28/18- She is here in follow up evalution. Apligraf was placed last week, she states the dressing fell off on Tuesday and she was dressing with hydrofera blue. She is healed and will be discharged from the clinic today. She has been instructed to continue with smoking cessation, continue monitoring glucose levels, offloading for an additional 4 weeks and continue with hydrofera blue for additional two weeks for any possible microscopic opening. Readmission: 08/07/18 on evaluation today patient presents for reevaluation concerning the ulcer of her right great toe. She was previously discharged on 06/28/18 healed. Nonetheless she states that this  began to show signs of drainage she subsequently went to her primary care provider. Subsequently an x-ray was performed on 08/01/18 which was negative. The patient was also placed on antibiotics at that time. Fortunately they should have been effective for the infection. Nonetheless she's been experiencing some improvement but still has a lot of drainage coming from the wound itself. 08/14/18 on evaluation today patient's wound actually does show signs of improvement in regard to the erythema at this point. She has completed the antibiotics. With that being said we did discuss the possibility of placing her in a total contact cast as of today although I think that I may want to give this just a little bit more time to ensure nothing recurrence as far as her infection is concerned. I do not want to put in the cast and risk infection at that time if things are not completely resolved. With that being said she is gonna require some debridement today. 08/21/18 on evaluation today patient actually appears to be doing okay in regard to her toe ulcer. She's been tolerating the dressing changes without complication. With that being said it does appear that she is ready and in fact I think it's appropriate for [place] to go ahead and initiate the  total contact cast today. Nonetheless she will require some sharp debridement to prepare the wound for application. Overall I feel like things have been progressing well but we do need to do something to get this to close more readily. 08/24/18 patient seen today for reevaluation after having had the total contact cast applied on Tuesday. She seems to have done very well the wound appears to be doing great and overall I'm pleased with the progress that she's made. There were no abnormal areas of rubbing from the cast on her lower extremity. 08/30/18 on evaluation today patient actually appears to be completely healed in regard to her plantar toe ulcer. She tells me at this  point she's been having a lot of issues with the cast. She almost fell a couple of times the state shall the step of her dog a couple times as well. This is been a very frustrating process for her other nonetheless she has completely healed the wound which is excellent news. Overall there does not appear to be the evidence of infection at this time which is great news. 09/11/18 evaluation today patient presents for follow-up concerning her great toe ulcer on the left which has unfortunately reopened since I last saw her which was only a couple of weeks ago. Unfortunately she was not able to get in to get the shoe and potentially the AFO that's gonna be necessary due to her left foot drop. She continues with offloading shoe but this is not enough to prevent her from reopening it appears. When we last had her in the total contact cast she did well from a healing standpoint but unfortunately the wound reopened as soon as she came out of the cast within just a couple of weeks. 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 This is also part of the reason why she falls on a regular basis. I just do not believe that is gonna be safe for [place] to reinitiate the total contact cast as last time we had this on she fell 3 times one week which is definitely not normal for her. 09/18/18 upon evaluation today the patient actually appears to be doing about the same in regard to her toe ulcer. She did not contact [employer] as I asked her to even though I had given her the prescription. In fact she actually states that she has no idea where the prescription is. She did apparently call [employer] and they told her that all she needed to do was bring the prescription in order to be able to be seen and work on getting the AFO for her  left foot. With all that being said she still does not have an appointment and I'm not sure were things stand that regard. I will give her a new prescription today in order to contact them to get this set up. 09/25/18 on evaluation today patient actually appears to be doing about the same in regard to her toes ulcer. She does have a small areas which seems to have a lot of callous buildup around the edge of the wound which is going to need sharp debridement today. She still is waiting to be scheduled for evaluation with [employer] for possibility of an AFO. She states there supposed to call her tomorrow to  get this set up. Unfortunately it does appear that her foot specifically the toe area is showing signs of erythema. There does not appear to be any systemic infection which is in these good news. [name], [name] ([id number]) 10/02/18 on evaluation today patient actually appears to be doing about the same in regard to her toe ulcer. This really has not done too well although it's not significantly larger it's also not significantly smaller. She has been tolerating the dressing changes without complication. She actually has her appointment with [employer] and [place] tomorrow to hopefully be measured for obtaining and AFO splint. I think this would be helpful preventing this from reoccurring. We had contemplated starting the cast this week although to be honest I am reluctant to do that as she's been having nausea, vomiting, and seizure activity over the past three days. She has a history of seizures and have been told is nothing that can be done for these. With that being said I do believe that along with the seizures have the nausea vomiting which upon further questioning doesn't seem to be the normal for her and makes me concerned for the possibility of infection or something else going on. I discussed this with the patient and her mother during the office visit today. I do not feel the wound is  effective but maybe something else. The responses this was "this just happens to her at times and we don't know why". They did not seem to be interested in going to the hospital to have this checked out further. 10/09/18 on evaluation today patient presents for follow-up concerning her ongoing toe ulcer. She has been tolerating the dressing changes without complication. Fortunately there does not appear to be any evidence of infection which is great news however I do think that the patient would benefit from going ahead for with the total contact cast. She's actually in a wheelchair today she tells me that she will use her walker if we initiate the cast. I was very specific about the fact that if we were gonna do the cast I wanted to make sure that she was using the walker in order to prevent any falls. She tells me she does not have stairs that she has to traverse on a regular basis at her home. She has not had any seizures since last week again that something that happens to her often she tells me she did talk to [employer] and they said that it may take up to three weeks to get the brace approved for her. Hopefully that will not take that long but nonetheless in the meantime I do think the cast could be of benefit. 10/12/18 on evaluation today patient appears to be doing rather well in regard to her toe ulcer. It's just been a few days and already this is significantly improved both as far as overall appearance and size. Fortunately there's no sign of infection. She is here for her first obligatory cast change. 10/19/18 Seen today for follow up and management of left great toe ulcer. Wound continues to show improvement. Noted small open area with seroussang drainage with palpation. Denies any increased pain or recent fevers during visit. She will continue calcium alginate with offloading shoe. Denies any questions or concerns during visit. 10/26/18 on evaluation today patient appears to be doing about  the same as when I last saw her in regard to her wound bed. Fortunately there does not appear to be any signs of infection. Unfortunately she continues to  have a breakdown in regard to the toe region any time that she is not in the cast. It takes almost no time at all for this to happen. Nonetheless she still has not heard anything from the brace being made by [employer] as to when exactly this will be available to her. Fortunately there is no signs of infection at this time. 10/30/18 on evaluation today patient presents for application of the total contact cast as we just received him this morning. Fortunately we are gonna be able to apply this to her today which is great news. She continues to have no significant pain which is good news. Overall I do feel like things have been improving while she was the cast is when she doesn't have a cast that things get worse. She still has not really heard anything from [place] regarding her brace. 11/02/18 upon evaluation today patient's wound already appears to be doing significantly better which is good news. Fortunately there does not appear to be any signs of infection also good news. Overall I do think the total contact cast as before is helping to heal this area unfortunately it's just not gonna likely keep the area closed and healed without her getting her brace at least. Again the foot drop is a significant issue for her. 11/09/18 on evaluation today patient appears to be doing excellent in regard to her toe ulcer which in fact is completely healed. Fortunately we finally got the situation squared away with the paperwork which was needed to proceed with getting her brace approved by Medicaid. I have filled that out unfortunately that information has been sent to the orthopedic office that I worked at 2 1/2 years ago and not tired Current wound care measures. Fortunately she seems to be doing very well at this time. 11/23/18 on evaluation today patient appears  to be doing More Poorly Compared to Last Time I Saw Her. At [facility] She Had Completely Healed. Currently she is continuing to have issues with reopening. She states that she just found out that the brace was approved through Medicaid now she just has to go get measured in order to have this fitted for her and then made. Subsequently she does not have an appointment for this yet that is going to complicate things we obviously cannot put her back in the cast if we do not have everything measured because they're not gonna be able to measure her foot while she is in the cast. 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 Obviously this is unfortunate and does have me somewhat worried as well. 11/30/18 on evaluation today patient's toe ulcer actually appears to be doing fairly well. The good news is she will be getting her brace in the shoes next week on Wednesday. Hopefully we will be able to get this to heal without having to go back in the cast however she may need the cast in order to get the wound completely heal and then go from there. Fortunately there's no signs of infection at this time. 12/07/18 on evaluation today patient fortunately did receive her brace and she states she could tell this definitely makes her walk better. With that being said she's been having issues with her toe where she noticed yesterday there was a lot of [name] that was loosing off this appears to be much larger than what it was previous. She  also states that her leg has been read putting much across the top of her foot just about the ankle although this seems to be receiving somewhat. The total area is still red and appears to be someone infected as best I can tell. She is previously taken Bactrim and that may be a good option for her today as well. We are gonna see what I wound culture shows as well and I think that this  is definitely appropriate. With that being said outside of the culture I still need to initiate something in the interim and that's what I'm gonna go ahead and select Bactrim is a good option for her. 12/14/18 on evaluation today patient appears to be doing better in regard to her left great toe ulcer as compared to last week's evaluation. There's still some erythema although this is significantly improved which is excellent news. Overall I do believe that she is making good progress is still gonna take some time before she is where I would like her to be from the standpoint of being able to place her back into the total contact cast. Hopefully we will be where we need to be by next week. 12/21/18 on evaluation today patient actually appears to be doing poorly in regard to her toe ulcer. She's been tolerating the dressing changes without complication. Fortunately there's no signs of systemic infection although she does have a lot of drainage from the toe ulcer and this does seem to be causing some issues at this point. She does have erythema on the distal portion of her toe that appears to be likely cellulitis. 12/28/18 on evaluation today patient actually appears to be doing a little better in my pinion in regard to her toe ulcer. With that being said she still does have some evidence of infection at this time and for her culture she had both E. coli as well as enterococcus as organisms noted on evaluation. For that reason I think that though the Keflex likely has treated the E. coli rather well this has really done nothing for the enterococcus. We are going to have to initiate treatment for this specifically. [name], [name] ([id number]) 01/04/19 on evaluation today patient's toe actually appears to be doing better from the standpoint of infection. She currently would like to see about putting the cash back on I think that this is appropriate as long as she takes care of it and keeps it from getting  wet. She is gonna have some drainage we can definitely pass this up with Drawtex and alginate to try to prevent as much drainage as possible from causing the problems. With that being said I do want to at least try her with the cast between now and Tuesday. If there any issues we can't continue to use it then I will discontinue the use of the cast at that point. 01/08/19 on evaluation today patient actually appears to be doing very well as far as her foot ulcer specifically the great toe on the left is concerned. She did have an area of rubbing on the medial aspect of her left ankle which again is from the cast. Fortunately there's no signs of infection at this point in this appears to be a very slight skin breakdown. The patient tells me she felt it rubbing but didn't think it was that bad. Fortunately there is no signs of active infection at this time which is good news. No fevers, chills, nausea, or vomiting noted at this time. 01/15/19  on evaluation today patient actually appears to be doing well in regard to her toe ulcer. Again as previous she seems to do well and she has the cast on which indicates to me that during the time she doesn't have a cast on she's putting way too much pressure on this region. Obviously I think that's gonna be an issue as with the current national emergency concerning the Covid-19 Virus it has been recommended that we discontinue the use of total contact casting by the chief medical officer of our company, Dr. Simona Huh. The reasoning is that if a patient becomes sick and cannot come into have the cast removed they could not just leave this on for an additional two weeks. Obviously the hospitals also do not want to receive patient's who are sick into the emergency department to potentially contaminate the region and spread the Covid-19 Virus among other sick individuals within the hospital system. Therefore at this point we are suspending the use of total contact cast until  the current emergency subsides. This was all discussed with the patient today as well. 01/22/19 on evaluation today patient's wound on her left great toe appears to be doing slightly worse than previously noted last week. She tells me that she has been on this quite a bit in fact she tells me she's been awake for 38 straight hours. This is due to the fact that she's having to care for grandparents because nobody else will. She has been taking care of them for five the last seven days since I've seen her they both have dementia his is from a stroke and her grandmother's was progressive. Nonetheless she states even her mom who knows her condition and situation has only help two of those days to take care of them she's been taking care of the rest. Fortunately there does not appear to be any signs of active infection in regard to her toe at this point although obviously it doesn't look as good as it did previous. I think this is directly related to her not taking off the pressure and friction by way of taking things easy. Though I completely understand what's going on. 01/29/19 on evaluation today patient's tools are actually appears to be showing some signs of improvement today compared to last week's evaluation as far as not necessarily the overall size of the wound but the fact that she has some new skin growth in between the two ends of the wound opening. Overall I feel like she has done well she states that she had a family member give her what sounds to be a CAM walker boot which has been helpful as well. 02/05/19 on evaluation today patient's wound bed actually appears to be doing significantly better in regard to her overall appearance of the size of the wound. With that being said she is still having an issue with offloading efficiently enough to get this to close. Apparently there is some signs of infection at this point as well unfortunately. Previously she's done well of Augmentin I really do not  see anything that needs to be culture currently but there theme and cellulitis of the foot that I'm seeing I'm gonna go ahead and place her on an antibiotic today to try to help clear this up. 02/12/2019 on evaluation today patient actually appears to be doing poorly in regard to her overall wound status. She tells me she has been using her offloading shoe but actually comes in today wearing her tennis shoe with the  AFO brace. Again as I previously discussed with her this is really not sufficient to allow the area to heal appropriately. Nonetheless she continues to be somewhat noncompliant and I do wonder based on what she has told my nurse in the past as to whether or not a good portion of this noncompliance may be recreational drug and alcohol related. She has had a history of heroin overdose and this was fairly recently in the past couple of months that have been seeing her. Nonetheless overall I feel like her wound looks significantly worse today compared to what it was previous. She still has significant erythema despite the Augmentin I am not sure that this is an appropriate medication for her infection I am also concerned that the infection may have gone down into her bone. 02/19/19 on evaluation today patient actually appears to be doing about the same in regard to her toe ulcer. Unfortunately she continues to show signs of bone exposure and infection at this point. There does not appear to be any evidence of worsening of the infection but I'm also not really sure that it's getting significantly better. She is on the Augmentin which should be sufficient for the Staphylococcus aureus infection that she has at this point. With that being said she may need IV antibiotics to more appropriately treat this. We did have a discussion today about hyperbaric option therapy. 02/28/19 on evaluation today patient actually appears to be doing much worse in regard to the wound on her left great toe as compared  to even my previous evaluation last week. Unfortunately this seems to be training in a pretty poor direction. Her toe was actually now starting to angle laterally and I can actually see the entire joint area of the proximal portion of the digit where is the distal portion of the digit again is no longer even in contact with the joint line. Unfortunately there's a lot more necrotic [name] around the edge and the toe appears to be showing signs of becoming gangrenous in my pinion. I'm very concerned about were things stand at this point. She did see infectious disease and they are planning to send in a prescription for Sivextro for her and apparently this has been approved. With that being said I don't think she should avoid taking this but at the same time I'm not sure that it's gonna be sufficient to save her toe at this point. 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 03/05/19 on evaluation today patient actually appears to be doing quite well in regard to her toe all things considering. She still has bone exposed but there appears to be much less your thing on overall the appearance of the wound and the toe itself is dramatically improved. She still does have some issues currently obviously with infection she did see vascular as well and there concerned that her blood flow to the toad. For that reason they are setting up for an angiogram next week. 03/14/19 on evaluation today patient appears to be doing very poor in regard to her toe and specifically in regard to the ulceration and the fact that she's starting to notice the toe was leaning even more towards the lateral aspect and the complete joint is visible on the proximal aspect of the  joint. Nonetheless she's also noted a significant odor and  the tip of the toe is turning more dark and necrotic appearing. Overall I think she is getting worse not better as far as this is concerned. For that reason I am recommending at this point that she likely needs to be seen for likely amputation. READMISSION 03/19/2021 This is a patient that we cared for in this clinic for a prolonged period of time in 2019 and 2020 with a left foot and left first toe wound. 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 ([id number]) 04/09/20 by Dr. Luana Shu. In December 2021 she had an ulcer on her right great toe as well as the fourth and fifth toes. 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 She also had an angiogram at that time and underwent angioplasty of the right anterior tibial artery. In any case she claims that the wound on the right foot is closed I did not look at this today which was probably an oversight although I think that should be done next week. After her surgery she developed a dehiscence but I do not see any follow-up of this. According to Dr. Deborra Medina last review that she was out of the area being cared for by another physician but recently came back to his attention. 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 A culture of this area showed E. coli apparently before she came back to see Dr. Luana Shu she was supposed to be receiving antibiotics but she did not really take them. Nor is she offloading this area at all. Finally her last hemoglobin A1c listed in epic was in March 2022 at 14.1 she says things are a lot better since then although I am not sure. 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 She had this wound at the time. During this admission she had E. coli sepsis a TEE was negative. Past medical  history is extensive and includes type 2 diabetes with peripheral neuropathy cardiomyopathy with an ejection fraction of 33%, hypertension, hyperlipidemia chronic renal failure stage III history of substance abuse with cocaine although she claims to be clean now verified by her mother. She is still a heavy cigarette smoker. She has a history of bipolar disorder seizure disorder ABI in our clinic was 1.05 6/1; left midfoot in the setting of a TMA done previously. Round circular wound with a "knuckle" of protruding [name] The problem is that the knuckle was not attached to any of the surrounding granulation and this probed proximally widely I removed a large portion of this [name] This wound goes with considerable undermining laterally. I do not feel any bone there was no purulence but this is a deep wound. 6/8; in spite of the debridement I did last week. She arrives with a wound looking exactly the same. A protruding "knuckle" of [name] nonadherent to most of the surrounding [name] There is considerable depth around this from 6-12 o'clock at 2.7 cm and undermining of 1 cm. This does not look overtly infected and the x-ray I did last week was negative for any osseous abnormalities. We have been using silver collagen 6/15; deep [name] culture I did last week showed moderate staph aureus and moderate Pseudomonas. This will definitely require prolonged antibiotic therapy. The pathology on the protuberant area  was negative for malignancy fungus etc. 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 We have been using silver collagen. I am going to be prescribing Levaquin for 2 weeks. Her CT scan of the foot is down for 7/5 6/22; CT scan of the foot on 7 5. She says she has hardware in the left leg from her previous fracture. She is on the Levaquin for the deep [name] culture I did that showed methicillin sensitive staph aureus and Pseudomonas. I gave  her a 2-week supply and she will have another week. She arrives in clinic today with the same protuberant [name] however this is nonadherent to the [name] surrounding it. I am really at a loss to explain this unless there is underlying deep [name] infection 6/29; patient presents for 1 week follow-up. She has been using collagen to the wound bed. She reports taking her antibiotics as prescribed.She has no complaints or issues today. She denies signs of infection. 7/6; patient presents for one week followup. She has been using collagen to the wound bed. She states she is taking Levaquin however at times she is not able to keep it down. She denies signs of infection. 7/13; patient presents for 1 week follow-up. She has been using silver alginate to the wound bed. She still has nausea when taking her antibiotics. She denies signs of infection. 7/20; patient presents for 1 week follow-up. She has been using silver alginate with gentamicin cream to the wound bed. She denies any issues and has no complaints today. She denies signs of infection. 7/27; patient presents for 1 week follow-up. She continues to use silver alginate with gentamicin cream to the wound bed. She reports starting her antibiotics. She has no issues or complaints. Overall she reports stability to the wound. 8/3; patient presents for 1 week follow-up. She has been using silver alginate with gentamicin cream to the wound bed. She reports completing all antibiotics. She has no issues or complaints today. She denies signs of infection. 8/17; patient presents for 2-week follow-up. He is to use silver alginate to the wound bed. She has no issues or complaints today. She denies signs of infection. She reports her pain has improved in her foot since last clinic visit 8/24; patient presents for 1 week follow-up. She continues to use silver alginate to the wound bed. She has no issues or complaints. She denies signs of infection. Pain is  stable. 9/7; patient presents for follow-up. She missed her last week appointment due to feeling ill. She continues to use silver alginate. She has a new wound to the right lower extremity that is covered in eschar. She states It occurred over the past week and has no idea how it started. She currently denies signs of infection. 9/14; patient presents for follow-up. To the left foot wound she has been using gentamicin cream and silver alginate. To the right lower extremity wound she has been keeping this covered and has not obtain Santyl. 9/21; patient presents for follow-up. She reports using gentamicin cream and silver alginate to the left foot and Santyl to the right lower extremity wound. She has no issues or complaints today. She denies signs of infection. 9/28; patient presents for follow-up. She reports a new wound to her right heel. She states this occurred a few days ago and is progressively gotten worse. She has been trying to clean the area with a Q-tip and Santyl. She reports stability in the other 2 wounds. She has  been using gentamicin cream and silver alginate to the left foot and Santyl to the right lower extremity wound. 10/12; patient presents for follow-up. She reports improvement to the wound beds. She is seeing vein and vascular to discuss the potential of a left BKA. She states they are going to do an arteriogram. She continues to use silver alginate with dressing changes to her wounds. 11/2; patient presents for follow-up. She states she has not been doing dressing changes to the wound beds. She states she is not able to offload the areas. She reports chronic pain to her left foot wound. 11/9; patient presents for follow-up. She came in with only socks on. She states she forgot to put on shoes. It is unclear if she is doing any dressing changes. She currently denies systemic signs of infection. 11/16; patient presents for follow-up. She came again only with socks on. She  states she does not wear shoes ever. It is unclear if she does dressing changes. She currently denies systemic signs of infection. 11/23; patient presents for follow-up. She wore her shoes today. It still unclear exactly what dressing she is using for each wound but she did states she obtained Dakin's solution and has been using this to the left foot wound. She currently denies signs of infection. 11/30; patient presents for follow-up. She has no issues or complaints today. She currently denies signs of infection. 12/7; patient presents for follow-up. She has no issues or complaints today. She has been using Hydrofera Blue to the right heel wound and Dakin solution to the left foot wound. Her right anterior leg wound is healed. She currently denies signs of infection. [name], [name] ([id number]) 12/14; patient presents for follow-up. She has been using Hydrofera Blue to the right heel and Dakin's to the left foot wounds. She has no issues or complaints today. She denies signs of infection. 12/21; patient presents for follow-up. She reports using Hydrofera Blue to the right heel and Dakin's to the left foot wound. She denies signs of infection. 12/28; patient presents for follow-up. She continues to use Dakin's to the left foot wound and Hydrofera Blue to the right heel wound. She denies signs of infection. 1/4; patient presents for follow-up. She has no issues or complaints today. She denies signs of infection. 1/11; patient presents for follow-up. It is unclear if she has been dressing these wounds over the past week. She currently denies signs of infection. 1/18; patient presents for follow-up. She states she has been using Dakin's wet-to-dry dressings to the left foot. She has been using Hydrofera Blue to the right foot foot wound. She states that the anterior right leg wound has reopened and draining serous fluid. She denies signs of infection. 1/25; patient presents for follow-up. She has  no issues or complaints today. 2/1; patient presents for follow-up. She has no issues or complaints today. She denies signs of infection. 2/8; patient presents for follow-up. She has lost her surgical shoes. She did not have a dressing to the right heel wound. She currently denies signs of infection. 2/15; patient presents for follow-up. She reports more pain to the right heel today. She denies purulent drainage Or fever/chills 2/22; patient presents for follow-up. She reports taking clindamycin over the past week. She states that she continues to have pain to her right heel. She reports purulent drainage. Electronic Signature(s) Signed: 12/15/2021 11:18:42 AM By: Kalman Shan DO Entered By: Kalman Shan on 12/15/2021 11:13:10 [name] ([id number]) -------------------------------------------------------------------------------- Physical Exam Details Patient  Name: ERCK, Donnesha A. Date of Service: 12/15/2021 10:30 AM Medical Record Number: [id number] Patient Account Number: [id number] Date of Birth/Sex: 09-Mar-1976 (47 y.o. F) Treating RN: Levora Dredge Primary Care Provider: Raelene Bott Other Clinician: Referring Provider: Raelene Bott Treating Provider/Extender: Yaakov Guthrie in Treatment: 57 Constitutional . Cardiovascular . Psychiatric . Notes Left foot (transmetatarsal amputation): Wound to the plantar aspect that has granulation [name] and scant nonviable [name] and circumferential callus. Right foot: To the heel there is an open wound and when probing depth has copious amounts of purulent drainage. Electronic Signature(s) Signed: 12/15/2021 11:18:42 AM By: Kalman Shan DO Entered By: Kalman Shan on 12/15/2021 11:13:51 [name] ([id number]) -------------------------------------------------------------------------------- Physician Orders Details Patient Name: Heather Dear A. Date of Service: 12/15/2021 10:30 AM Medical Record  Number: [id number] Patient Account Number: [id number] Date of Birth/Sex: 1976/05/02 (46 y.o. F) Treating RN: Levora Dredge Primary Care Provider: Raelene Bott Other Clinician: Referring Provider: Raelene Bott Treating Provider/Extender: Yaakov Guthrie in Treatment: 76 Verbal / Phone Orders: No Diagnosis Coding Follow-up Appointments o Return Appointment in 1 week. Bathing/ Shower/ Hygiene o Wash wounds with antibacterial soap and water. Off-Loading Left Lower Extremity o Open toe surgical shoe with peg assist. o Turn and reposition every 2 hours o Other: - peg shoe right, off-loader left Medications-Please add to medication list. o P.O. Antibiotics o Other: - per MD [place] ok to leave right lower leg dressing on for 3-4 days and then continue daily Wound Treatment Wound #7 - Foot Wound Laterality: Plantar, Left, Midline Cleanser: Dakin 16 (oz) 0.25 1 x Per Day/30 Days Discharge Instructions: Use as directed. Primary Dressing: Gauze (DME) (Generic) 1 x Per Day/30 Days Discharge Instructions: As directed: moistened with Dakins Solution Secondary Dressing: Kerlix 4.5 x 4.1 (in/yd) (DME) (Generic) 1 x Per Day/30 Days Discharge Instructions: Apply Kerlix 4.5 x 4.1 (in/yd) as instructed Secured With: 66M Medipore H Soft Cloth Surgical Tape, 2x2 (in/yd) (DME) (Generic) 1 x Per Day/30 Days Wound #9 - Calcaneus Wound Laterality: Right Cleanser: Dakin 16 (oz) 0.25 1 x Per Day/30 Days Discharge Instructions: Use as directed. Primary Dressing: Gauze (DME) (Generic) 1 x Per Day/30 Days Discharge Instructions: As directed: moistened with Dakins Solution Secondary Dressing: ABD Pad 5x9 (in/in) (DME) (Generic) 1 x Per Day/30 Days Discharge Instructions: Cover with ABD pad Secondary Dressing: Kerlix 4.5 x 4.1 (in/yd) (DME) (Generic) 1 x Per Day/30 Days Discharge Instructions: Apply Kerlix 4.5 x 4.1 (in/yd) as instructed Secured With: 66M Medipore H Soft Cloth Surgical  Tape, 2x2 (in/yd) (DME) (Generic) 1 x Per Day/30 Days Electronic Signature(s) Signed: 12/15/2021 11:38:40 AM By: Kalman Shan DO Signed: 12/15/2021 4:28:37 PM By: Levora Dredge Previous Signature: 12/15/2021 11:18:42 AM Version By: Kalman Shan DO Entered By: Levora Dredge on 12/15/2021 11:23:06 [name], [name] ([id number]) -------------------------------------------------------------------------------- Problem List Details Patient Name: Heather Dear A. Date of Service: 12/15/2021 10:30 AM Medical Record Number: [id number] Patient Account Number: [id number] Date of Birth/Sex: [birth year] (46 y.o. 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 03/19/2021 No Yes severity L97.512 Non-pressure chronic ulcer of other part of right foot with fat layer 09/15/2021 No Yes exposed L97.812 Non-pressure chronic ulcer of other part of right lower leg with fat layer 09/15/2021 No Yes exposed E11.621 Type 2 diabetes mellitus with foot  ulcer 03/19/2021 No Yes E11.42 Type 2 diabetes mellitus with diabetic polyneuropathy 03/19/2021 No Yes L97.811 Non-pressure chronic ulcer of other part of right lower leg limited to 11/10/2021 No Yes breakdown of skin Inactive Problems ICD-10 Code Description Active Date Inactive Date S81.801A Unspecified open wound, right lower leg, initial encounter 06/30/2021 06/30/2021 Resolved Problems Electronic Signature(s) Signed: 12/15/2021 11:18:42 AM By: Kalman Shan DO Entered By: Kalman Shan on 12/15/2021 11:12:25 Masley, Nija A. ([id number]) -------------------------------------------------------------------------------- Progress Note Details Patient  Name: Heather Dear A. Date of Service: 12/15/2021 10:30 AM Medical Record Number: [id number] Patient Account Number: [id number] Date of Birth/Sex: 09-06-1976 (46 y.o. F) Treating RN: Levora Dredge Primary Care Provider: Raelene Bott Other Clinician: Referring Provider: Raelene Bott Treating Provider/Extender: Yaakov Guthrie in Treatment: 1 Subjective Chief Complaint Information obtained from Patient 03/19/2021; patient referred by Dr. Luana Shu who has been looking after her left foot for quite a period of time for review of a nonhealing area in the left midfoot History of Present Illness (HPI) 01/18/18-She is here for initial evaluation of the left great toe ulcer. She is a poor historian in regards to timeframe in detail. She states approximately 4 weeks ago she lacerated her toe on something in the house. She followed up with her primary care who placed her on Bactrim and ultimately a second dose of Bactrim prior to coming to wound clinic. She states she has been treating the toe with peroxide, Betadine and a Band-Aid. She did not check her blood sugar this morning but checked it yesterday morning it was 327; she is unaware of a recent A1c and there are no current records. She saw Dr. she would've orthopedics last week for an old injury to the left ankle, she states he did not see her toe, nor did she bring it to his attention. She smokes approximately 1 pack cigarettes a day. Her social situation is concerning, she arrives this morning with her mother who appears extremely intoxicated/under the influence; her mother was asked to leave the room and be monitored by the patient's grandmother. The patient's aunt then accompanied the patient and the room throughout the rest of the appointment. We had a lengthy discussion regarding the deleterious effects of uncontrolled hyperglycemia and smoking as it relates to wound healing and overall health. She was strongly encouraged to decrease  her smoking and get her diabetes under better control. She states she is currently on a diet and has cut down her [facility] consumption. The left toe is erythematous, macerated and slightly edematous with malodor present. The edema in her left foot is below her baseline, there is no erythema streaking. We will treat her with Santyl, doxycycline; we have ordered and xray, culture and provided a Peg assist surgical shoe and cultured the wound. 01/25/18-She is here in follow-up evaluation for a left great toe ulcer and presents with an abscess to her suprapubic area. She states her blood sugars remain elevated, feeling "sick" and if levels are below 250, but she is trying. She has made no attempt to decrease her smoking stating that we "can't take away her food in her cigarettes". She has been compliant with offloading using the PEG assist you. She is using Santyl daily. the culture obtained last week grew staph aureus and Enterococcus faecalis; continues on the doxycycline and Augmentin was added on Monday. The suprapubic area has erythema, no femoral variation, purple discoloration, minimal induration, was accessed with a cotton tip applicator with sanguinopurulent drainage, this was cultured, I suspect the  current antibiotic treatment will cover and we will not add anything to her current treatment plan. She was advised to go to urgent care or ER with any change in redness, induration or fever. 02/01/18-She is here in follow-up evaluation for left great toe ulcers and a new abdominal abscess from last week. She was able to use packing until earlier this week, where she "forgot it was there". She states she was feeling ill with GI symptoms last week and was not taking her antibiotic. She states her glucose levels have been predominantly less than 200, with occasional levels between 200-250. She thinks this was contributing to her GI symptoms as they have resolved without intervention. There continues to  be significant laceration to left toe, otherwise it clinically looks stable/improved. There is now less superficial opening to the lateral aspect of the great toe that was residual blister. We will transition to [facility] to all wounds, she will continue her Augmentin. If there is no change or deterioration next week for reculture. 02/08/18-She is here in follow-up evaluation for left great toe ulcer and abdominal ulcer. There is an improvement in both wounds. She has been wrapping her left toe with coban, not by our direction, which has created an area of discoloration to the medial aspect; she has been advised to NOT use coban secondary to her neuropathy. She states her glucose levels have been high over this last week ranging from 200-350, she continues to smoke. She admits to being less compliant with her offloading shoe. We will continue with same treatment plan and she will follow-up next week. 02/15/18-She is here in follow-up evaluation for left great toe ulcer and abdominal ulcer. The abdominal ulcer is epithelialized. The left great toe ulcer is improved and all injury from last week using the Coban wrap is resolved, the lateral ulcer is healed. She admits to noncompliance with wearing offloading shoe and admits to glucose levels being greater than 300 most of the week. She continues to smoke and expresses no desire to quit. There is one area medially that probes deeper than it has historically, erythema to the toe and dorsal foot has consistently waxed and waned. There is no overt signs of cellulitis or infection but we will culture the wound for any occult infection given the new area of depth and erythema. We will hold off on sensitivities for initiation of antibiotic therapy. 02/22/18-She is here in follow up evaluation for left great toe ulcer. There is overall significant improvement in both wound appearance, erythema and edema with changes made last week. She was not initiated on  antibiotic therapy. Culture obtained last week showed oxacillin sensitive staph aureus, sensitive to clindamycin. Clindamycin has been called into the pharmacy but she has been instructed to hold off on initiation secondary to overall clinical improvement and her history of antibiotic intolerance. She has been instructed to contact the clinic with any noted changes/deterioration and the wound, erythema, edema and/or pain. She will follow-up next week. She continues to smoke and her glucose levels remain elevated >250; she admits to compliance with offloading shoe 03/01/18 on evaluation today patient appears to be doing fairly well in regard to her left first toe ulcer. She has been tolerating the dressing changes with the [facility] Dressing without complication and overall this has definitely showed signs of improvement according to records as well is what the patient tells me today. I'm very pleased in that regard. She is having no pain today 03/08/18 She is here for  follow up evaluation of a left great toe ulcer. She remains non-compliant with glucose control and smoking cessation; glucose levels consistently >200. She states that she got new shoe inserts/peg assist. She admits to compliance with offloading. Since my last evaluation there is significant improvement. We will switch to prisma at this time and she will follow up next week. She is noted to be tachycardic at this appointment, heart rate 120s; she has a history of heart rate 70-130 according to our records. She admits to extreme agitation r/t personal issues; she was advised to monitor her heartrate and contact her physician if it does not return to a more normal range (<100). She takes cardizem twice daily. 03/15/18-She is here in follow-up evaluation for left great toe ulcer. She remains noncompliant with glucose control and smoking cessation. She admits to compliance with wearing offloading shoe. The ulcer is improved/stable and we  will continue with the same treatment plan and she will follow-up next week 03/22/18-She is here for evaluation for left great toe ulcer. There continues to be significant improvement despite recurrent hyperglycemia (over 500 yesterday) and she continues to smoke. She has been compliant with offloading and we will continue with same treatment plan and she will follow-up next week. Record, Zakariah A. ([id number]) 03/29/18-She is here for evaluation for left great toe ulcer. Despite continuing to smoke and uncontrolled diabetes she continues to improve. She is compliant with offloading shoe. We will continue with the same treatment plan and she will follow-up next week 04/05/18- She is here in follow up evaluation for a left great toe ulcer; she presents with small pustule to left fifth toe (resembles ant bite). She admits to compliance with wearing offloading shoe; continues to smoke or have uncontrolled blood glucose control. There is more callus than usual with evidence of bleeding; she denies known trauma. 04/12/18-She is here for evaluation of left great toe ulcer. Despite noncompliance with glycemic control and smoking she continues to make improvement. She continues to wear offloading shoe. The pustule, that was identified last week, to the left fifth toe is resolved. She will follow-up in 2 weeks 05/03/18-she is seen in follow-up evaluation for a left great toe ulcer. She is compliant with offloading, otherwise noncompliant with glycemic control and smoking. She has plateaued and there is minimal improvement noted. We will transition to [facility], replaced the insert to her surgical shoe and she will follow-up in one week 05/10/18- She is here in follow up evaluation for a left great toe ulcer. It appears stable despite measurement change. We will continue with same treatment plan and follow up next week. 05/24/18-She is seen in follow-up evaluation for a left great toe ulcer. She remains  compliant with offloading, has made significant improvement in her diet, decreasing the amount of sugar/soda. She said her recent A1c was 10.9 which is lower than. She did see a diabetic nutritionist/educator yesterday. She continues to smoke. We will continue with the same treatment plan and she'll follow-up next week. 05/31/18- She is seen in follow-up evaluation for left great toe ulcer. She continues to remain compliant with offloading, continues to make improvement in her diet, increasing her water and decreasing the amount of sugar/soda. She does continue to smoke with no desire to quit. We will apply Prisma to the depth and Hydrofera Blue over. We have not received insurance authorization for oasis. She will follow up next week. 06/07/18-She is seen in follow-up evaluation for left great toe ulcer. It has stalled according to  today's measurements although base appears stable. She says she saw a diabetic educator yesterday; her average blood sugars are less than 300 which is an improvement for her. She continues to smoke and states "that's my next step" She continues with water over soda. We will order for xray, culture and reinstate ace wrap compression prior to placing apligraf for next week. She is voicing no complaints or concerns. Her dressing will change to iodoflex over the next week in preparation for apligraf. 06/14/18-She is seen in follow-up evaluation for left great toe ulcer. Plain film x-ray performed last week was negative for osteomyelitis. Wound culture obtained last week grew strep B and OSSA; she is initiated on keflex and cefdinir today; there is erythema to the toe which could be from ace wrap compression, she has a history of wrapping too tight and has has been encouraged to maintain ace wraps that we place today. We will hold off on application of apligraf today, will apply next week after antibiotic therapy has been initiated. She admits today that she has resumed taking a  shower with her foot/toe submerged in water, she has been reminded to keep foot/toe out of the bath water. She will be seen in follow up next week 06/21/18-she is seen in follow-up evaluation for left great toe ulcer. She is tolerating antibiotic therapy with no GI disturbance. The wound is stable. Apligraf was applied today. She has been decreasing her smoking, only had 4 cigarettes yesterday and 1 today. She continues being more compliant in diabetic diet. She will follow-up next week for evaluation of site, if stable will remove at 2 weeks. 06/28/18- She is here in follow up evalution. Apligraf was placed last week, she states the dressing fell off on Tuesday and she was dressing with hydrofera blue. She is healed and will be discharged from the clinic today. She has been instructed to continue with smoking cessation, continue monitoring glucose levels, offloading for an additional 4 weeks and continue with hydrofera blue for additional two weeks for any possible microscopic opening. Readmission: 08/07/18 on evaluation today patient presents for reevaluation concerning the ulcer of her right great toe. She was previously discharged on 06/28/18 healed. Nonetheless she states that this began to show signs of drainage she subsequently went to her primary care provider. Subsequently an x-ray was performed on 08/01/18 which was negative. The patient was also placed on antibiotics at that time. Fortunately they should have been effective for the infection. Nonetheless she's been experiencing some improvement but still has a lot of drainage coming from the wound itself. 08/14/18 on evaluation today patient's wound actually does show signs of improvement in regard to the erythema at this point. She has completed the antibiotics. With that being said we did discuss the possibility of placing her in a total contact cast as of today although I think that I may want to give this just a little bit more time to  ensure nothing recurrence as far as her infection is concerned. I do not want to put in the cast and risk infection at that time if things are not completely resolved. With that being said she is gonna require some debridement today. 08/21/18 on evaluation today patient actually appears to be doing okay in regard to her toe ulcer. She's been tolerating the dressing changes without complication. With that being said it does appear that she is ready and in fact I think it's appropriate for [place] to go ahead and initiate the total contact cast  today. Nonetheless she will require some sharp debridement to prepare the wound for application. Overall I feel like things have been progressing well but we do need to do something to get this to close more readily. 08/24/18 patient seen today for reevaluation after having had the total contact cast applied on Tuesday. She seems to have done very well the wound appears to be doing great and overall I'm pleased with the progress that she's made. There were no abnormal areas of rubbing from the cast on her lower extremity. 08/30/18 on evaluation today patient actually appears to be completely healed in regard to her plantar toe ulcer. She tells me at this point she's been having a lot of issues with the cast. She almost fell a couple of times the state shall the step of her dog a couple times as well. This is been a very frustrating process for her other nonetheless she has completely healed the wound which is excellent news. Overall there does not appear to be the evidence of infection at this time which is great news. 09/11/18 evaluation today patient presents for follow-up concerning her great toe ulcer on the left which has unfortunately reopened since I last saw her which was only a couple of weeks ago. Unfortunately she was not able to get in to get the shoe and potentially the AFO that's gonna be necessary due to her left foot drop. She continues with offloading  shoe but this is not enough to prevent her from reopening it appears. When we last had her in the total contact cast she did well from a healing standpoint but unfortunately the wound reopened as soon as she came out of the cast within just a couple of weeks. 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 This is also part of the reason why she falls on a regular basis. I just do not believe that is gonna be safe for [place] to reinitiate the total contact cast as last time we had this on she fell 3 times one week which is definitely not normal for her. 09/18/18 upon evaluation today the patient actually appears to be doing about the same in regard to her toe ulcer. She did not contact [employer] as I asked her to even though I had given her the prescription. In fact she actually states that she has no idea where the prescription is. She did apparently call [employer] and they told her that all she needed to do was bring the prescription in order to be able to be seen and work on getting the AFO for her left foot. With all that being said she still does not have an appointment and I'm not sure were things stand that regard. I will give her a new prescription today in order to contact them to get this set up. [name], [name] ([id number]) 09/25/18 on evaluation today patient actually appears to be doing about the same in regard to her toes ulcer. She does have a small areas which seems to have a lot of callous buildup around the edge of the wound which is going to need sharp debridement today. She still is waiting to be scheduled for evaluation with [employer] for possibility of an AFO. She states there supposed to call her tomorrow to  get this set up. Unfortunately it does appear that her foot specifically the toe area is  showing signs of erythema. There does not appear to be any systemic infection which is in these good news. 10/02/18 on evaluation today patient actually appears to be doing about the same in regard to her toe ulcer. This really has not done too well although it's not significantly larger it's also not significantly smaller. She has been tolerating the dressing changes without complication. She actually has her appointment with [facility] and [place] tomorrow to hopefully be measured for obtaining and AFO splint. I think this would be helpful preventing this from reoccurring. We had contemplated starting the cast this week although to be honest I am reluctant to do that as she's been having nausea, vomiting, and seizure activity over the past three days. She has a history of seizures and have been told is nothing that can be done for these. With that being said I do believe that along with the seizures have the nausea vomiting which upon further questioning doesn't seem to be the normal for her and makes me concerned for the possibility of infection or something else going on. I discussed this with the patient and her mother during the office visit today. I do not feel the wound is effective but maybe something else. The responses this was "this just happens to her at times and we don't know why". They did not seem to be interested in going to the hospital to have this checked out further. 10/09/18 on evaluation today patient presents for follow-up concerning her ongoing toe ulcer. She has been tolerating the dressing changes without complication. Fortunately there does not appear to be any evidence of infection which is great news however I do think that the patient would benefit from going ahead for with the total contact cast. She's actually in a wheelchair today she tells me that she will use her walker if we initiate the cast. I was very specific about the fact that if we were gonna do the cast I  wanted to make sure that she was using the walker in order to prevent any falls. She tells me she does not have stairs that she has to traverse on a regular basis at her home. She has not had any seizures since last week again that something that happens to her often she tells me she did talk to [employer] and they said that it may take up to three weeks to get the brace approved for her. Hopefully that will not take that long but nonetheless in the meantime I do think the cast could be of benefit. 10/12/18 on evaluation today patient appears to be doing rather well in regard to her toe ulcer. It's just been a few days and already this is significantly improved both as far as overall appearance and size. Fortunately there's no sign of infection. She is here for her first obligatory cast change. 10/19/18 Seen today for follow up and management of left great toe ulcer. Wound continues to show improvement. Noted small open area with seroussang drainage with palpation. Denies any increased pain or recent fevers during visit. She will continue calcium alginate with offloading shoe. Denies any questions or concerns during visit. 10/26/18 on evaluation today patient appears to be doing about the same as when I last saw her in regard to her wound bed. Fortunately there does not appear to be any signs of infection. Unfortunately she continues to have a breakdown in  regard to the toe region any time that she is not in the cast. It takes almost no time at all for this to happen. Nonetheless she still has not heard anything from the brace being made by [employer] as to when exactly this will be available to her. Fortunately there is no signs of infection at this time. 10/30/18 on evaluation today patient presents for application of the total contact cast as we just received him this morning. Fortunately we are gonna be able to apply this to her today which is great news. She continues to have no significant pain which is  good news. Overall I do feel like things have been improving while she was the cast is when she doesn't have a cast that things get worse. She still has not really heard anything from [place] regarding her brace. 11/02/18 upon evaluation today patient's wound already appears to be doing significantly better which is good news. Fortunately there does not appear to be any signs of infection also good news. Overall I do think the total contact cast as before is helping to heal this area unfortunately it's just not gonna likely keep the area closed and healed without her getting her brace at least. Again the foot drop is a significant issue for her. 11/09/18 on evaluation today patient appears to be doing excellent in regard to her toe ulcer which in fact is completely healed. Fortunately we finally got the situation squared away with the paperwork which was needed to proceed with getting her brace approved by Medicaid. I have filled that out unfortunately that information has been sent to the orthopedic office that I worked at 2 1/2 years ago and not tired Current wound care measures. Fortunately she seems to be doing very well at this time. 11/23/18 on evaluation today patient appears to be doing More Poorly Compared to Last Time I Saw Her. At [facility] She Had Completely Healed. Currently she is continuing to have issues with reopening. She states that she just found out that the brace was approved through Medicaid now she just has to go get measured in order to have this fitted for her and then made. Subsequently she does not have an appointment for this yet that is going to complicate things we obviously cannot put her back in the cast if we do not have everything measured because they're not gonna be able to measure her foot while she is in the cast. 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 Obviously  this is unfortunate and does have me somewhat worried as well. 11/30/18 on evaluation today patient's toe ulcer actually appears to be doing fairly well. The good news is she will be getting her brace in the shoes next week on Wednesday. Hopefully we will be able to get this to heal without having to go back in the cast however she may need the cast in order to get the wound completely heal and then go from there. Fortunately there's no signs of infection at this time. 12/07/18 on evaluation today patient fortunately did receive her brace and she states she could tell this definitely makes her walk better. With that being said she's been having issues with her toe where she noticed yesterday there was a lot of [name] that was loosing off this appears to be much larger than what it was previous. She also states that  her leg has been read putting much across the top of her foot just about the ankle although this seems to be receiving somewhat. The total area is still red and appears to be someone infected as best I can tell. She is previously taken Bactrim and that may be a good option for her today as well. We are gonna see what I wound culture shows as well and I think that this is definitely appropriate. With that being said outside of the culture I still need to initiate something in the interim and that's what I'm gonna go ahead and select Bactrim is a good option for her. 12/14/18 on evaluation today patient appears to be doing better in regard to her left great toe ulcer as compared to last week's evaluation. There's still some erythema although this is significantly improved which is excellent news. Overall I do believe that she is making good progress is still gonna take some time before she is where I would like her to be from the standpoint of being able to place her back into the total contact cast. Hopefully we will be where we need to be by next week. 12/21/18 on evaluation today patient  actually appears to be doing poorly in regard to her toe ulcer. She's been tolerating the dressing changes without complication. Fortunately there's no signs of systemic infection although she does have a lot of drainage from the toe ulcer and this does [name], [name] ([id number]) seem to be causing some issues at this point. She does have erythema on the distal portion of her toe that appears to be likely cellulitis. 12/28/18 on evaluation today patient actually appears to be doing a little better in my pinion in regard to her toe ulcer. With that being said she still does have some evidence of infection at this time and for her culture she had both E. coli as well as enterococcus as organisms noted on evaluation. For that reason I think that though the Keflex likely has treated the E. coli rather well this has really done nothing for the enterococcus. We are going to have to initiate treatment for this specifically. 01/04/19 on evaluation today patient's toe actually appears to be doing better from the standpoint of infection. She currently would like to see about putting the cash back on I think that this is appropriate as long as she takes care of it and keeps it from getting wet. She is gonna have some drainage we can definitely pass this up with Drawtex and alginate to try to prevent as much drainage as possible from causing the problems. With that being said I do want to at least try her with the cast between now and Tuesday. If there any issues we can't continue to use it then I will discontinue the use of the cast at that point. 01/08/19 on evaluation today patient actually appears to be doing very well as far as her foot ulcer specifically the great toe on the left is concerned. She did have an area of rubbing on the medial aspect of her left ankle which again is from the cast. Fortunately there's no signs of infection at this point in this appears to be a very slight skin breakdown. The  patient tells me she felt it rubbing but didn't think it was that bad. Fortunately there is no signs of active infection at this time which is good news. No fevers, chills, nausea, or vomiting noted at this time. 01/15/19 on evaluation today  patient actually appears to be doing well in regard to her toe ulcer. Again as previous she seems to do well and she has the cast on which indicates to me that during the time she doesn't have a cast on she's putting way too much pressure on this region. Obviously I think that's gonna be an issue as with the current national emergency concerning the Covid-19 Virus it has been recommended that we discontinue the use of total contact casting by the chief medical officer of our company, Dr. Simona Huh. The reasoning is that if a patient becomes sick and cannot come into have the cast removed they could not just leave this on for an additional two weeks. Obviously the hospitals also do not want to receive patient's who are sick into the emergency department to potentially contaminate the region and spread the Covid-19 Virus among other sick individuals within the hospital system. Therefore at this point we are suspending the use of total contact cast until the current emergency subsides. This was all discussed with the patient today as well. 01/22/19 on evaluation today patient's wound on her left great toe appears to be doing slightly worse than previously noted last week. She tells me that she has been on this quite a bit in fact she tells me she's been awake for 38 straight hours. This is due to the fact that she's having to care for grandparents because nobody else will. She has been taking care of them for five the last seven days since I've seen her they both have dementia his is from a stroke and her grandmother's was progressive. Nonetheless she states even her mom who knows her condition and situation has only help two of those days to take care of them she's been  taking care of the rest. Fortunately there does not appear to be any signs of active infection in regard to her toe at this point although obviously it doesn't look as good as it did previous. I think this is directly related to her not taking off the pressure and friction by way of taking things easy. Though I completely understand what's going on. 01/29/19 on evaluation today patient's tools are actually appears to be showing some signs of improvement today compared to last week's evaluation as far as not necessarily the overall size of the wound but the fact that she has some new skin growth in between the two ends of the wound opening. Overall I feel like she has done well she states that she had a family member give her what sounds to be a CAM walker boot which has been helpful as well. 02/05/19 on evaluation today patient's wound bed actually appears to be doing significantly better in regard to her overall appearance of the size of the wound. With that being said she is still having an issue with offloading efficiently enough to get this to close. Apparently there is some signs of infection at this point as well unfortunately. Previously she's done well of Augmentin I really do not see anything that needs to be culture currently but there theme and cellulitis of the foot that I'm seeing I'm gonna go ahead and place her on an antibiotic today to try to help clear this up. 02/12/2019 on evaluation today patient actually appears to be doing poorly in regard to her overall wound status. She tells me she has been using her offloading shoe but actually comes in today wearing her tennis shoe with the AFO brace. Again as  I previously discussed with her this is really not sufficient to allow the area to heal appropriately. Nonetheless she continues to be somewhat noncompliant and I do wonder based on what she has told my nurse in the past as to whether or not a good portion of this noncompliance may be  recreational drug and alcohol related. She has had a history of heroin overdose and this was fairly recently in the past couple of months that have been seeing her. Nonetheless overall I feel like her wound looks significantly worse today compared to what it was previous. She still has significant erythema despite the Augmentin I am not sure that this is an appropriate medication for her infection I am also concerned that the infection may have gone down into her bone. 02/19/19 on evaluation today patient actually appears to be doing about the same in regard to her toe ulcer. Unfortunately she continues to show signs of bone exposure and infection at this point. There does not appear to be any evidence of worsening of the infection but I'm also not really sure that it's getting significantly better. She is on the Augmentin which should be sufficient for the Staphylococcus aureus infection that she has at this point. With that being said she may need IV antibiotics to more appropriately treat this. We did have a discussion today about hyperbaric option therapy. 02/28/19 on evaluation today patient actually appears to be doing much worse in regard to the wound on her left great toe as compared to even my previous evaluation last week. Unfortunately this seems to be training in a pretty poor direction. Her toe was actually now starting to angle laterally and I can actually see the entire joint area of the proximal portion of the digit where is the distal portion of the digit again is no longer even in contact with the joint line. Unfortunately there's a lot more necrotic [name] around the edge and the toe appears to be showing signs of becoming gangrenous in my pinion. I'm very concerned about were things stand at this point. She did see infectious disease and they are planning to send in a prescription for Sivextro for her and apparently this has been approved. With that being said I don't think she should  avoid taking this but at the same time I'm not sure that it's gonna be sufficient to save her toe at this point. 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 03/05/19 on evaluation today patient actually appears to be doing quite well in regard to her toe all things considering. She still has bone exposed but there appears to be much less your thing on overall the appearance of the wound and the toe itself is dramatically improved. She still does have some issues currently obviously with infection she did see vascular as well and there concerned that her blood flow to the toad. For that reason they are setting up for an angiogram next week. 03/14/19 on evaluation today patient appears to be doing very poor in regard to her toe and specifically in regard to the ulceration and the fact that she's starting to notice the toe was leaning even more towards the lateral aspect and the complete joint is visible on the proximal aspect of the joint. Nonetheless she's  also noted a significant odor and the tip of the toe is turning more dark and necrotic appearing. Overall I think she is getting worse not better as far as this is concerned. For that reason I am recommending at this point that she likely needs to be seen for likely amputation. [name], [name] ([id number]) READMISSION 03/19/2021 This is a patient that we cared for in this clinic for a prolonged period of time in 2019 and 2020 with a left foot and left first toe wound. 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 Luana Shu. In December 2021 she had an ulcer on her right great toe as well as the fourth and fifth toes. She underwent a  partial ray amputation of the right fourth and fifth toes. She also had an angiogram at that time and underwent angioplasty of the right anterior tibial artery. In any case she claims that the wound on the right foot is closed I did not look at this today which was probably an oversight although I think that should be done next week. After her surgery she developed a dehiscence but I do not see any follow-up of this. According to Dr. Deborra Medina last review that she was out of the area being cared for by another physician but recently came back to his attention. 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 A culture of this area showed E. coli apparently before she came back to see Dr. Luana Shu she was supposed to be receiving antibiotics but she did not really take them. Nor is she offloading this area at all. Finally her last hemoglobin A1c listed in epic was in March 2022 at 14.1 she says things are a lot better since then although I am not sure. 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 She had this wound at the time. During this admission she had E. coli sepsis a TEE was negative. 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 She is still a heavy cigarette smoker. She has a history of bipolar disorder seizure disorder ABI in our clinic was 1.05 6/1; left midfoot in the setting of a TMA done previously. Round circular wound with a "knuckle" of protruding [name] The problem is that the knuckle was not attached to any of the surrounding granulation and this probed proximally widely I removed a large portion of this [name] This wound goes with considerable  undermining laterally. I do not feel any bone there was no purulence but this is a deep wound. 6/8; in spite of the debridement I did last week. She arrives with a wound looking exactly the same. A protruding "knuckle" of [name] nonadherent to most of the surrounding [name] There is considerable depth around this from 6-12 o'clock at 2.7 cm and undermining of 1 cm. This does not look overtly infected and the x-ray I did last week was negative for any osseous abnormalities. We have been using silver collagen 6/15; deep [name] culture I did last week showed moderate staph aureus and moderate Pseudomonas. This will definitely require prolonged antibiotic therapy. The pathology on the protuberant area was negative for  malignancy fungus etc. 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 We have been using silver collagen. I am going to be prescribing Levaquin for 2 weeks. Her CT scan of the foot is down for 7/5 6/22; CT scan of the foot on 7 5. She says she has hardware in the left leg from her previous fracture. She is on the Levaquin for the deep [name] culture I did that showed methicillin sensitive staph aureus and Pseudomonas. I gave her a 2-week supply and she will have another week. She arrives in clinic today with the same protuberant [name] however this is nonadherent to the [name] surrounding it. I am really at a loss to explain this unless there is underlying deep [name] infection 6/29; patient presents for 1 week follow-up. She has been using collagen to the wound bed. She reports taking her antibiotics as prescribed.She has no complaints or issues today. She denies signs of infection. 7/6; patient presents for one week followup. She has been using collagen to the wound bed. She states she is taking Levaquin however at times she is not able to keep it down. She denies signs of infection. 7/13; patient presents for 1 week follow-up. She  has been using silver alginate to the wound bed. She still has nausea when taking her antibiotics. She denies signs of infection. 7/20; patient presents for 1 week follow-up. She has been using silver alginate with gentamicin cream to the wound bed. She denies any issues and has no complaints today. She denies signs of infection. 7/27; patient presents for 1 week follow-up. She continues to use silver alginate with gentamicin cream to the wound bed. She reports starting her antibiotics. She has no issues or complaints. Overall she reports stability to the wound. 8/3; patient presents for 1 week follow-up. She has been using silver alginate with gentamicin cream to the wound bed. She reports completing all antibiotics. She has no issues or complaints today. She denies signs of infection. 8/17; patient presents for 2-week follow-up. He is to use silver alginate to the wound bed. She has no issues or complaints today. She denies signs of infection. She reports her pain has improved in her foot since last clinic visit 8/24; patient presents for 1 week follow-up. She continues to use silver alginate to the wound bed. She has no issues or complaints. She denies signs of infection. Pain is stable. 9/7; patient presents for follow-up. She missed her last week appointment due to feeling ill. She continues to use silver alginate. She has a new wound to the right lower extremity that is covered in eschar. She states It occurred over the past week and has no idea how it started. She currently denies signs of infection. 9/14; patient presents for follow-up. To the left foot wound she has been using gentamicin cream and silver alginate. To the right lower extremity wound she has been keeping this covered and has not obtain Santyl. 9/21; patient presents for follow-up. She reports using gentamicin cream and silver alginate to the left foot and Santyl to the right lower extremity wound. She has no issues or  complaints today. She denies signs of infection. 9/28; patient presents for follow-up. She reports a new wound to her right heel. She states this occurred a few days ago and is progressively gotten worse. She has been trying to clean the area with a Q-tip and Santyl. She reports stability in the other 2 wounds. She has been using gentamicin  cream and silver alginate to the left foot and Santyl to the right lower extremity wound. 10/12; patient presents for follow-up. She reports improvement to the wound beds. She is seeing vein and vascular to discuss the potential of a left BKA. She states they are going to do an arteriogram. She continues to use silver alginate with dressing changes to her wounds. 11/2; patient presents for follow-up. She states she has not been doing dressing changes to the wound beds. She states she is not able to offload the areas. She reports chronic pain to her left foot wound. 11/9; patient presents for follow-up. She came in with only socks on. She states she forgot to put on shoes. It is unclear if she is doing any dressing changes. She currently denies systemic signs of infection. 11/16; patient presents for follow-up. She came again only with socks on. She states she does not wear shoes ever. It is unclear if she does dressing changes. She currently denies systemic signs of infection. 11/23; patient presents for follow-up. She wore her shoes today. It still unclear exactly what dressing she is using for each wound but she did states she obtained Dakin's solution and has been using this to the left foot wound. She currently denies signs of infection. [name], [name] ([id number]) 11/30; patient presents for follow-up. She has no issues or complaints today. She currently denies signs of infection. 12/7; patient presents for follow-up. She has no issues or complaints today. She has been using Hydrofera Blue to the right heel wound and Dakin solution to the left foot wound.  Her right anterior leg wound is healed. She currently denies signs of infection. 12/14; patient presents for follow-up. She has been using Hydrofera Blue to the right heel and Dakin's to the left foot wounds. She has no issues or complaints today. She denies signs of infection. 12/21; patient presents for follow-up. She reports using Hydrofera Blue to the right heel and Dakin's to the left foot wound. She denies signs of infection. 12/28; patient presents for follow-up. She continues to use Dakin's to the left foot wound and Hydrofera Blue to the right heel wound. She denies signs of infection. 1/4; patient presents for follow-up. She has no issues or complaints today. She denies signs of infection. 1/11; patient presents for follow-up. It is unclear if she has been dressing these wounds over the past week. She currently denies signs of infection. 1/18; patient presents for follow-up. She states she has been using Dakin's wet-to-dry dressings to the left foot. She has been using Hydrofera Blue to the right foot foot wound. She states that the anterior right leg wound has reopened and draining serous fluid. She denies signs of infection. 1/25; patient presents for follow-up. She has no issues or complaints today. 2/1; patient presents for follow-up. She has no issues or complaints today. She denies signs of infection. 2/8; patient presents for follow-up. She has lost her surgical shoes. She did not have a dressing to the right heel wound. She currently denies signs of infection. 2/15; patient presents for follow-up. She reports more pain to the right heel today. She denies purulent drainage Or fever/chills 2/22; patient presents for follow-up. She reports taking clindamycin over the past week. She states that she continues to have pain to her right heel. She reports purulent drainage. Objective Constitutional Vitals Time Taken: 10:32 AM, Height: 69 in, Weight: 185 lbs, BMI: 27.3, Temperature:  97.6 F, Pulse: 78 bpm, Respiratory Rate: 18 breaths/min, Blood Pressure: 96/68 mmHg.  General Notes: Left foot (transmetatarsal amputation): Wound to the plantar aspect that has granulation [name] and scant nonviable [name] and circumferential callus. Right foot: To the heel there is an open wound and when probing depth has copious amounts of purulent drainage. Integumentary (Hair, Skin) Wound #7 status is Open. Original cause of wound was Gradually Appeared. The date acquired was: 08/07/2020. The wound has been in treatment 38 weeks. The wound is located on the [place]. The wound measures 4cm length x 1.4cm width x 0.3cm depth; 4.398cm^2 area and 1.319cm^3 volume. There is Fat Layer (Subcutaneous [name]) exposed. There is no tunneling or undermining noted. There is a large amount of serosanguineous drainage noted. The wound margin is thickened. There is large (67-100%) red, pink, hyper - granulation within the wound bed. There is a small (1-33%) amount of necrotic [name] within the wound bed including Adherent Slough. Wound #9 status is Open. Original cause of wound was Gradually Appeared. The date acquired was: 07/18/2021. The wound has been in treatment 21 weeks. The wound is located on the Right Calcaneus. The wound measures 1cm length x 1cm width x 1.9cm depth; 0.785cm^2 area and 1.492cm^3 volume. There is Fat Layer (Subcutaneous [name]) exposed. There is no tunneling or undermining noted. There is a medium amount of serosanguineous drainage noted. The wound margin is well defined and not attached to the wound base. There is small (1-33%) pink granulation within the wound bed. There is a large (67-100%) amount of necrotic [name] within the wound bed including Adherent Slough. Assessment Active Problems ICD-10 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ity Carbo, Ilena A. 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 Unfortunately the right heel wound has declined. Despite clindamycin she now has purulent drainage on exam and increased warmth and erythema to the surrounding wound. At this time I recommended going to the emergency room. She is scheduled for CT scan on 3/2 but this will have to be expedited now that her symptoms have worsened. She will need imaging and IV antibiotics. All of this was discussed with the patient and the patient's mother. She expressed understanding and states she will go to [facility] Today. Plan Follow-up Appointments: Return Appointment in 1 week. Bathing/ Shower/ Hygiene: Wash wounds with antibacterial soap and water. Off-Loading: Open toe surgical shoe with peg assist. Turn and reposition every 2 hours Other: - peg shoe right, off-loader left Medications-Please add to medication list.: P.O. Antibiotics Other: - per MD [place] ok to leave right lower leg dressing on for 3-4 days and then continue daily WOUND #7: - Foot Wound Laterality: Plantar, Left, Midline Cleanser: Dakin 16 (oz) 0.25 1 x Per Day/30 Days Discharge Instructions: Use as directed. Primary Dressing: Gauze 1 x Per Day/30 Days Discharge Instructions: As directed: moistened with Dakins Solution Secondary Dressing: Kerlix 4.5 x 4.1 (in/yd) 1 x Per Day/30 Days Discharge Instructions: Apply Kerlix 4.5 x 4.1 (in/yd) as instructed Secured With: 29M Medipore H Soft Cloth Surgical Tape, 2x2 (in/yd) 1 x Per Day/30 Days Secured With: Kerlix Roll Sterile or Non-Sterile 6-ply 4.5x4 (yd/yd) 1 x Per Day/30 Days Discharge Instructions: Apply Kerlix as  directed WOUND #9: - Calcaneus Wound Laterality: Right Cleanser: Dakin 16 (oz) 0.25 1 x Per Day/30 Days Discharge Instructions: Use as directed. Primary Dressing: Gauze 1 x Per Day/30 Days Discharge Instructions: As directed: moistened with Dakins Solution Secondary Dressing: ABD Pad 5x9 (in/in) 1 x Per Day/30 Days Discharge Instructions: Cover with ABD pad Secondary Dressing: Kerlix 4.5 x 4.1 (in/yd) 1 x Per Day/30 Days Discharge Instructions: Apply Kerlix 4.5 x 4.1 (in/yd) as instructed Secured With: 51M Medipore H Soft Cloth Surgical Tape, 2x2 (in/yd) 1 x Per Day/30 Days 1. Go to the Emergency department 2. Dakin's wet-to-dry 3. Follow-up after Hospital discharge Electronic Signature(s) Signed: 12/15/2021 11:18:42 AM By: Kalman Shan DO Entered By: Kalman Shan on 12/15/2021 11:16:52 [name] ([id number]) -------------------------------------------------------------------------------- SuperBill Details Patient Name: Heather Dear A. Date of Service: 12/15/2021 Medical Record Number: [id number] Patient Account Number: [id number] Date of Birth/Sex: 21-Jan-1976 (46 y.o. F) Treating RN: Levora Dredge Primary Care Provider: Raelene Bott Other Clinician: Referring Provider: Raelene Bott Treating Provider/Extender: Yaakov Guthrie in Treatment: 38 Diagnosis Coding ICD-10 Codes Code Description [phone] 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ity L97.512 Non-pressure chronic ulcer of other part of right foot with fat layer exposed L97.812 Non-pressure chronic ulcer of other part of right lower leg with fat layer exposed E11.621 Type 2 diabetes mellitus with foot ulcer E11.42 Type 2 diabetes mellitus with diabetic polyneuropathy L97.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 of skin L03.115 Cellulitis of right lower limb Facility Procedures CPT4 Code: [id number] Description:  99213 - WOUND CARE VISIT-LEV 3 EST PT Modifier: Quantity: 1 Physician Procedures CPT4 Code: [id number] Description: 99214 - WC PHYS LEVEL 4 - EST PT Modifier: Quantity: 1 CPT4 Code: Description: ICD-10 Diagnosis Description L97.512 Non-pressure chronic ulcer of other part of right foot with fat layer exp L97.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 of other part of left foot with other specifie E11.621 Type 2 diabetes mellitus with foot ulcer  L03.115 Cellulitis of right lower limb Modifier: osed d severity Quantity: Electronic Signature(s) Signed: 12/15/2021 1:01:30 PM By: Levora Dredge Signed: 12/15/2021 1:05:16 PM By: Kalman Shan DO Previous Signature: 12/15/2021 11:18:42 AM Version By: Kalman Shan DO Entered By: Levora Dredge on 12/15/2021 13:01:28

## 2021-12-22 ENCOUNTER — Encounter: Payer: Medicaid Other | Admitting: Internal Medicine

## 2021-12-23 ENCOUNTER — Ambulatory Visit: Payer: Medicaid Other

## 2021-12-29 ENCOUNTER — Encounter: Payer: Medicaid Other | Admitting: Internal Medicine

## 2022-01-05 ENCOUNTER — Encounter: Payer: Medicaid Other | Admitting: Internal Medicine

## 2022-01-12 ENCOUNTER — Encounter: Payer: Medicaid Other | Attending: Internal Medicine | Admitting: Internal Medicine

## 2022-01-19 ENCOUNTER — Encounter: Payer: Medicaid Other | Admitting: Internal Medicine

## 2022-03-16 ENCOUNTER — Encounter: Payer: Medicaid Other | Attending: Internal Medicine | Admitting: Internal Medicine

## 2022-03-16 ENCOUNTER — Other Ambulatory Visit
Admission: RE | Admit: 2022-03-16 | Discharge: 2022-03-16 | Disposition: A | Discharge status: Home / Self Care | Payer: Medicaid Other | Source: Ambulatory Visit | Attending: Internal Medicine | Admitting: Internal Medicine

## 2022-03-16 DIAGNOSIS — M722 Plantar fascial fibromatosis: Secondary | ICD-10-CM | POA: Diagnosis present

## 2022-03-16 DIAGNOSIS — L97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 of skin: Secondary | ICD-10-CM | POA: Insufficient documentation

## 2022-03-16 DIAGNOSIS — F17210 Nicotine dependence, cigarettes, uncomplicated: Secondary | ICD-10-CM | POA: Diagnosis not present

## 2022-03-16 DIAGNOSIS — B999 Unspecified infectious disease: Secondary | ICD-10-CM | POA: Diagnosis not present

## 2022-03-16 DIAGNOSIS — L97512 Non-pressure chronic ulcer of other part of right foot with fat layer exposed: Secondary | ICD-10-CM | POA: Insufficient documentation

## 2022-03-16 DIAGNOSIS — E1142 Type 2 diabetes mellitus with diabetic polyneuropathy: Secondary | ICD-10-CM | POA: Insufficient documentation

## 2022-03-16 DIAGNOSIS — L97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ity: Secondary | ICD-10-CM | POA: Insufficient documentation

## 2022-03-16 DIAGNOSIS — E11621 Type 2 diabetes mellitus with foot ulcer: Secondary | ICD-10-CM | POA: Diagnosis not present

## 2022-03-21 LAB — AEROBIC/ANAEROBIC CULTURE W GRAM STAIN (SURGICAL/DEEP WOUND): Gram Stain: NONE SEEN

## 2022-03-22 NOTE — Progress Notes (Addendum)
KAILY, WRAGG ([id number]) Visit Report for 03/16/2022 Chief Complaint Document Details Patient Name: [name], [name] Date of Service: 03/16/2022 12:45 PM Medical Record Number: [id number] Patient Account Number: [id number] Date of Birth/Sex: May 07, 1976 (46 y.o. Female) Treating RN: Cornell Barman Primary Care Provider: Raelene Bott Other Clinician: Referring Provider: Raelene Bott Treating Provider/Extender: Yaakov Guthrie in Treatment: 0 Information Obtained from: Patient Chief Complaint 03/19/2021; patient referred by Dr. Luana Shu who has been looking after her left foot for quite a period of time for review of a nonhealing area in the left midfoot 03/12/2022; bilateral feet wounds and right lower extremity wound. Electronic Signature(s) Signed: 03/16/2022 3:29:15 PM By: Kalman Shan DO Entered By: Kalman Shan on 03/16/2022 14:43:00 [name] ([id number]) -------------------------------------------------------------------------------- Debridement Details Patient Name: Heather Dear A. Date of Service: 03/16/2022 12:45 PM Medical Record Number: [id number] Patient Account Number: [id number] Date of Birth/Sex: [birth year] (46 y.o. Female) Treating RN: Cornell Barman Primary Care Provider: Raelene Bott Other Clinician: Referring Provider: Raelene Bott Treating Provider/Extender: Yaakov Guthrie in Treatment: 0 Debridement Performed for Wound #11 Right,Plantar Foot Assessment: Performed By: Physician Kalman Shan, MD Debridement Type: Debridement Level of Consciousness (Pre- Awake and Alert procedure): Pre-procedure Verification/Time Out Yes - 13:50 Taken: Start Time: 13:50 Pain Control: Lidocaine 4% Topical Solution Total Area Debrided (L x W): 2.5 (cm) x 0.5 (cm) = 1.25 (cm) Tissue and other material Viable, Non-Viable, Slough, Slough debrided: Level: Non-Viable Tissue Debridement Description: Selective/Open Wound Instrument:  Curette Bleeding: Minimum Hemostasis Achieved: Pressure End Time: 14:00 Procedural Pain: 0 Post Procedural Pain: 0 Response to Treatment: Procedure was tolerated well Level of Consciousness (Post- Awake and Alert procedure): Post Debridement Measurements of Total Wound Length: (cm) 4.7 Width: (cm) 0.5 Depth: (cm) 0.9 Volume: (cm) 1.661 Character of Wound/Ulcer Post Debridement: Improved Post Procedure Diagnosis Same as Pre-procedure Electronic Signature(s) Signed: 03/16/2022 3:29:15 PM By: Kalman Shan DO Signed: 03/17/2022 5:36:30 PM By: Gretta Cool, BSN, RN, CWS, [name] RN, BSN Signed: 03/22/2022 2:38:02 PM By: Massie Kluver Entered By: Massie Kluver on 03/16/2022 14:15:04 [name], [name] ([id number]) -------------------------------------------------------------------------------- Debridement Details Patient Name: Heather Dear A. Date of Service: 03/16/2022 12:45 PM Medical Record Number: [id number] Patient Account Number: [id number] Date of Birth/Sex: 1976-10-13 (46 y.o. Female) Treating RN: Cornell Barman Primary Care Provider: Raelene Bott Other Clinician: Referring Provider: Raelene Bott Treating Provider/Extender: Yaakov Guthrie in Treatment: 0 Debridement Performed for Wound #12 Left,Medial,Plantar Foot Assessment: Performed By: Physician Kalman Shan, MD Debridement Type: Debridement Severity of Tissue Pre Debridement: Fat layer exposed Level of Consciousness (Pre- Awake and Alert procedure): Pre-procedure Verification/Time Out Yes - 13:50 Taken: Start Time: 13:50 Pain Control: Lidocaine 4% Topical Solution Total Area Debrided (L x W): 2 (cm) x 3 (cm) = 6 (cm) Tissue and other material Viable, Non-Viable, Slough, Slough debrided: Level: Non-Viable Tissue Debridement Description: Selective/Open Wound Instrument: Curette Bleeding: Minimum Hemostasis Achieved: Pressure End Time: 14:00 Procedural Pain: 0 Post Procedural Pain: 0 Response to  Treatment: Procedure was tolerated well Level of Consciousness (Post- Awake and Alert procedure): Post Debridement Measurements of Total Wound Length: (cm) 2 Width: (cm) 3 Depth: (cm) 0.2 Volume: (cm) 0.942 Character of Wound/Ulcer Post Debridement: Improved Severity of Tissue Post Debridement: Fat layer exposed Post Procedure Diagnosis Same as Pre-procedure Electronic Signature(s) Signed: 03/16/2022 3:29:15 PM By: Kalman Shan DO Signed: 03/17/2022 5:36:30 PM By: Gretta Cool, BSN, RN, CWS, [name] RN, BSN Signed: 03/22/2022 2:38:02 PM By: Massie Kluver Entered By: Massie Kluver on 03/16/2022 14:16:56 [name], [name] ([id number]) -------------------------------------------------------------------------------- HPI Details Patient Name:  [name], [name] Date of Service: 03/16/2022 12:45 PM Medical Record Number: [id number] Patient Account Number: [id number] Date of Birth/Sex: Mar 17, 1976 (46 y.o. Female) Treating RN: Cornell Barman Primary Care Provider: Raelene Bott Other Clinician: Referring Provider: Raelene Bott Treating Provider/Extender: Yaakov Guthrie in Treatment: 0 History of Present Illness HPI Description: 01/18/18-She is here for initial evaluation of the left great toe ulcer. She is a poor historian in regards to timeframe in detail. She states approximately 4 weeks ago she lacerated her toe on something in the house. She followed up with her primary care who placed her on Bactrim and ultimately a second dose of Bactrim prior to coming to wound clinic. She states she has been treating the toe with peroxide, Betadine and a Band-Aid. She did not check her blood sugar this morning but checked it yesterday morning it was 327; she is unaware of a recent A1c and there are no current records. She saw Dr. she would've orthopedics last week for an old injury to the left ankle, she states he did not see her toe, nor did she bring it to his attention. She smokes approximately 1  pack cigarettes a day. Her social situation is concerning, she arrives this morning with her mother who appears extremely intoxicated/under the influence; her mother was asked to leave the room and be monitored by the patient's grandmother. The patient's aunt then accompanied the patient and the room throughout the rest of the appointment. We had a lengthy discussion regarding the deleterious effects of uncontrolled hyperglycemia and smoking as it relates to wound healing and overall health. She was strongly encouraged to decrease her smoking and get her diabetes under better control. She states she is currently on a diet and has cut down her [facility] consumption. The left toe is erythematous, macerated and slightly edematous with malodor present. The edema in her left foot is below her baseline, there is no erythema streaking. We will treat her with Santyl, doxycycline; we have ordered and xray, culture and provided a Peg assist surgical shoe and cultured the wound. 01/25/18-She is here in follow-up evaluation for a left great toe ulcer and presents with an abscess to her suprapubic area. She states her blood sugars remain elevated, feeling "sick" and if levels are below 250, but she is trying. She has made no attempt to decrease her smoking stating that we "can't take away her food in her cigarettes". She has been compliant with offloading using the PEG assist you. She is using Santyl daily. the culture obtained last week grew staph aureus and Enterococcus faecalis; continues on the doxycycline and Augmentin was added on Monday. The suprapubic area has erythema, no femoral variation, purple discoloration, minimal induration, was accessed with a cotton tip applicator with sanguinopurulent drainage, this was cultured, I suspect the current antibiotic treatment will cover and we will not add anything to her current treatment plan. She was advised to go to urgent care or ER with any change in redness,  induration or fever. 02/01/18-She is here in follow-up evaluation for left great toe ulcers and a new abdominal abscess from last week. She was able to use packing until earlier this week, where she "forgot it was there". She states she was feeling ill with GI symptoms last week and was not taking her antibiotic. She states her glucose levels have been predominantly less than 200, with occasional levels between 200-250. She thinks this was contributing to her GI symptoms as they have resolved without intervention. There continues  to be significant laceration to left toe, otherwise it clinically looks stable/improved. There is now less superficial opening to the lateral aspect of the great toe that was residual blister. We will transition to [facility] to all wounds, she will continue her Augmentin. If there is no change or deterioration next week for reculture. 02/08/18-She is here in follow-up evaluation for left great toe ulcer and abdominal ulcer. There is an improvement in both wounds. She has been wrapping her left toe with coban, not by our direction, which has created an area of discoloration to the medial aspect; she has been advised to NOT use coban secondary to her neuropathy. She states her glucose levels have been high over this last week ranging from 200-350, she continues to smoke. She admits to being less compliant with her offloading shoe. We will continue with same treatment plan and she will follow-up next week. 02/15/18-She is here in follow-up evaluation for left great toe ulcer and abdominal ulcer. The abdominal ulcer is epithelialized. The left great toe ulcer is improved and all injury from last week using the Coban wrap is resolved, the lateral ulcer is healed. She admits to noncompliance with wearing offloading shoe and admits to glucose levels being greater than 300 most of the week. She continues to smoke and expresses no desire to quit. There is one area medially that  probes deeper than it has historically, erythema to the toe and dorsal foot has consistently waxed and waned. There is no overt signs of cellulitis or infection but we will culture the wound for any occult infection given the new area of depth and erythema. We will hold off on sensitivities for initiation of antibiotic therapy. 02/22/18-She is here in follow up evaluation for left great toe ulcer. There is overall significant improvement in both wound appearance, erythema and edema with changes made last week. She was not initiated on antibiotic therapy. Culture obtained last week showed oxacillin sensitive staph aureus, sensitive to clindamycin. Clindamycin has been called into the pharmacy but she has been instructed to hold off on initiation secondary to overall clinical improvement and her history of antibiotic intolerance. She has been instructed to contact the clinic with any noted changes/deterioration and the wound, erythema, edema and/or pain. She will follow-up next week. She continues to smoke and her glucose levels remain elevated >250; she admits to compliance with offloading shoe 03/01/18 on evaluation today patient appears to be doing fairly well in regard to her left first toe ulcer. She has been tolerating the dressing changes with the [facility] without complication and overall this has definitely showed signs of improvement according to records as well is what the patient tells me today. I'm very pleased in that regard. She is having no pain today 03/08/18 She is here for follow up evaluation of a left great toe ulcer. She remains non-compliant with glucose control and smoking cessation; glucose levels consistently >200. She states that she got new shoe inserts/peg assist. She admits to compliance with offloading. Since my last evaluation there is significant improvement. We will switch to prisma at this time and she will follow up next week. She is noted to be  tachycardic at this appointment, heart rate 120s; she has a history of heart rate 70-130 according to our records. She admits to extreme agitation r/t personal issues; she was advised to monitor her heartrate and contact her physician if it does not return to a more normal range (<100). She takes cardizem twice daily.  03/15/18-She is here in follow-up evaluation for left great toe ulcer. She remains noncompliant with glucose control and smoking cessation. She admits to compliance with wearing offloading shoe. The ulcer is improved/stable and we will continue with the same treatment plan and she will follow-up next week 03/22/18-She is here for evaluation for left great toe ulcer. There continues to be significant improvement despite recurrent hyperglycemia (over 500 yesterday) and she continues to smoke. She has been compliant with offloading and we will continue with same treatment plan and she will follow-up next week. 03/29/18-She is here for evaluation for left great toe ulcer. Despite continuing to smoke and uncontrolled diabetes she continues to improve. She is compliant with offloading shoe. We will continue with the same treatment plan and she will follow-up next week 04/05/18- She is here in follow up evaluation for a left great toe ulcer; she presents with small pustule to left fifth toe (resembles ant bite). She admits to compliance with wearing offloading shoe; continues to smoke or have uncontrolled blood glucose control. There is more callus than usual with evidence of bleeding; she denies known trauma. 04/12/18-She is here for evaluation of left great toe ulcer. Despite noncompliance with glycemic control and smoking she continues to make Dade, Ilana A. ([id number]) improvement. She continues to wear offloading shoe. The pustule, that was identified last week, to the left fifth toe is resolved. She will follow-up in 2 weeks 05/03/18-she is seen in follow-up evaluation for a left great  toe ulcer. She is compliant with offloading, otherwise noncompliant with glycemic control and smoking. She has plateaued and there is minimal improvement noted. We will transition to [facility], replaced the insert to her surgical shoe and she will follow-up in one week 05/10/18- She is here in follow up evaluation for a left great toe ulcer. It appears stable despite measurement change. We will continue with same treatment plan and follow up next week. 05/24/18-She is seen in follow-up evaluation for a left great toe ulcer. She remains compliant with offloading, has made significant improvement in her diet, decreasing the amount of sugar/soda. She said her recent A1c was 10.9 which is lower than. She did see a diabetic nutritionist/educator yesterday. She continues to smoke. We will continue with the same treatment plan and she'll follow-up next week. 05/31/18- She is seen in follow-up evaluation for left great toe ulcer. She continues to remain compliant with offloading, continues to make improvement in her diet, increasing her water and decreasing the amount of sugar/soda. She does continue to smoke with no desire to quit. We will apply Prisma to the depth and Hydrofera Blue over. We have not received insurance authorization for oasis. She will follow up next week. 06/07/18-She is seen in follow-up evaluation for left great toe ulcer. It has stalled according to today's measurements although base appears stable. She says she saw a diabetic educator yesterday; her average blood sugars are less than 300 which is an improvement for her. She continues to smoke and states "that's my next step" She continues with water over soda. We will order for xray, culture and reinstate ace wrap compression prior to placing apligraf for next week. She is voicing no complaints or concerns. Her dressing will change to iodoflex over the next week in preparation for apligraf. 06/14/18-She is seen in follow-up evaluation  for left great toe ulcer. Plain film x-ray performed last week was negative for osteomyelitis. Wound culture obtained last week grew strep B and OSSA; she is initiated on keflex  and cefdinir today; there is erythema to the toe which could be from ace wrap compression, she has a history of wrapping too tight and has has been encouraged to maintain ace wraps that we place today. We will hold off on application of apligraf today, will apply next week after antibiotic therapy has been initiated. She admits today that she has resumed taking a shower with her foot/toe submerged in water, she has been reminded to keep foot/toe out of the bath water. She will be seen in follow up next week 06/21/18-she is seen in follow-up evaluation for left great toe ulcer. She is tolerating antibiotic therapy with no GI disturbance. The wound is stable. Apligraf was applied today. She has been decreasing her smoking, only had 4 cigarettes yesterday and 1 today. She continues being more compliant in diabetic diet. She will follow-up next week for evaluation of site, if stable will remove at 2 weeks. 06/28/18- She is here in follow up evalution. Apligraf was placed last week, she states the dressing fell off on Tuesday and she was dressing with hydrofera blue. She is healed and will be discharged from the clinic today. She has been instructed to continue with smoking cessation, continue monitoring glucose levels, offloading for an additional 4 weeks and continue with hydrofera blue for additional two weeks for any possible microscopic opening. Readmission: 08/07/18 on evaluation today patient presents for reevaluation concerning the ulcer of her right great toe. She was previously discharged on 06/28/18 healed. Nonetheless she states that this began to show signs of drainage she subsequently went to her primary care provider. Subsequently an x-ray was performed on 08/01/18 which was negative. The patient was also placed on  antibiotics at that time. Fortunately they should have been effective for the infection. Nonetheless she's been experiencing some improvement but still has a lot of drainage coming from the wound itself. 08/14/18 on evaluation today patient's wound actually does show signs of improvement in regard to the erythema at this point. She has completed the antibiotics. With that being said we did discuss the possibility of placing her in a total contact cast as of today although I think that I may want to give this just a little bit more time to ensure nothing recurrence as far as her infection is concerned. I do not want to put in the cast and risk infection at that time if things are not completely resolved. With that being said she is gonna require some debridement today. 08/21/18 on evaluation today patient actually appears to be doing okay in regard to her toe ulcer. She's been tolerating the dressing changes without complication. With that being said it does appear that she is ready and in fact I think it's appropriate for [place] to go ahead and initiate the total contact cast today. Nonetheless she will require some sharp debridement to prepare the wound for application. Overall I feel like things have been progressing well but we do need to do something to get this to close more readily. 08/24/18 patient seen today for reevaluation after having had the total contact cast applied on Tuesday. She seems to have done very well the wound appears to be doing great and overall I'm pleased with the progress that she's made. There were no abnormal areas of rubbing from the cast on her lower extremity. 08/30/18 on evaluation today patient actually appears to be completely healed in regard to her plantar toe ulcer. She tells me at this point she's been having a lot  of issues with the cast. She almost fell a couple of times the state shall the step of her dog a couple times as well. This is been a very frustrating  process for her other nonetheless she has completely healed the wound which is excellent news. Overall there does not appear to be the evidence of infection at this time which is great news. 09/11/18 evaluation today patient presents for follow-up concerning her great toe ulcer on the left which has unfortunately reopened since I last saw her which was only a couple of weeks ago. Unfortunately she was not able to get in to get the shoe and potentially the AFO that's gonna be necessary due to her left foot drop. She continues with offloading shoe but this is not enough to prevent her from reopening it appears. When we last had her in the total contact cast she did well from a healing standpoint but unfortunately the wound reopened as soon as she came out of the cast within just a couple of weeks. 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 This is also part of the reason why she falls on a regular basis. I just do not believe that is gonna be safe for [place] to reinitiate the total contact cast as last time we had this on she fell 3 times one week which is definitely not normal for her. 09/18/18 upon evaluation today the patient actually appears to be doing about the same in regard to her toe ulcer. She did not contact [facility] as I asked her to even though I had given her the prescription. In fact she actually states that she has no idea where the prescription is. She did apparently call [facility] and they told her that all she needed to do was bring the prescription in order to be able to be seen and work on getting the AFO for her left foot. With all that being said she still does not have an appointment and I'm not sure were things stand that regard. I will give her a new prescription today in order to contact  them to get this set up. 09/25/18 on evaluation today patient actually appears to be doing about the same in regard to her toes ulcer. She does have a small areas which seems to have a lot of callous buildup around the edge of the wound which is going to need sharp debridement today. She still is waiting to be scheduled for evaluation with [facility] for possibility of an AFO. She states there supposed to call her tomorrow to get this set up. Unfortunately it does appear that her foot specifically the toe area is showing signs of erythema. There does not appear to be any systemic infection which is in these good news. ADDALEE, [name] ([id number]) 10/02/18 on evaluation today patient actually appears to be doing about the same in regard to her toe ulcer. This really has not done too well although it's not significantly larger it's also not significantly smaller. She has been tolerating the dressing changes without complication. She actually has her appointment with [facility] and [facility] tomorrow to hopefully be measured for obtaining and AFO splint. I think this would be helpful preventing this from reoccurring. We had contemplated starting the cast this week although to  be honest I am reluctant to do that as she's been having nausea, vomiting, and seizure activity over the past three days. She has a history of seizures and have been told is nothing that can be done for these. With that being said I do believe that along with the seizures have the nausea vomiting which upon further questioning doesn't seem to be the normal for her and makes me concerned for the possibility of infection or something else going on. I discussed this with the patient and her mother during the office visit today. I do not feel the wound is effective but maybe something else. The responses this was "this just happens to her at times and we don't know why". They did not seem to be interested in going to the hospital to have this  checked out further. 10/09/18 on evaluation today patient presents for follow-up concerning her ongoing toe ulcer. She has been tolerating the dressing changes without complication. Fortunately there does not appear to be any evidence of infection which is great news however I do think that the patient would benefit from going ahead for with the total contact cast. She's actually in a wheelchair today she tells me that she will use her walker if we initiate the cast. I was very specific about the fact that if we were gonna do the cast I wanted to make sure that she was using the walker in order to prevent any falls. She tells me she does not have stairs that she has to traverse on a regular basis at her home. She has not had any seizures since last week again that something that happens to her often she tells me she did talk to [employer] and they said that it may take up to three weeks to get the brace approved for her. Hopefully that will not take that long but nonetheless in the meantime I do think the cast could be of benefit. 10/12/18 on evaluation today patient appears to be doing rather well in regard to her toe ulcer. It's just been a few days and already this is significantly improved both as far as overall appearance and size. Fortunately there's no sign of infection. She is here for her first obligatory cast change. 10/19/18 Seen today for follow up and management of left great toe ulcer. Wound continues to show improvement. Noted small open area with seroussang drainage with palpation. Denies any increased pain or recent fevers during visit. She will continue calcium alginate with offloading shoe. Denies any questions or concerns during visit. 10/26/18 on evaluation today patient appears to be doing about the same as when I last saw her in regard to her wound bed. Fortunately there does not appear to be any signs of infection. Unfortunately she continues to have a breakdown in regard to the  toe region any time that she is not in the cast. It takes almost no time at all for this to happen. Nonetheless she still has not heard anything from the brace being made by [employer] as to when exactly this will be available to her. Fortunately there is no signs of infection at this time. 10/30/18 on evaluation today patient presents for application of the total contact cast as we just received him this morning. Fortunately we are gonna be able to apply this to her today which is great news. She continues to have no significant pain which is good news. Overall I do feel like things have been improving while she was the  cast is when she doesn't have a cast that things get worse. She still has not really heard anything from [place] regarding her brace. 11/02/18 upon evaluation today patient's wound already appears to be doing significantly better which is good news. Fortunately there does not appear to be any signs of infection also good news. Overall I do think the total contact cast as before is helping to heal this area unfortunately it's just not gonna likely keep the area closed and healed without her getting her brace at least. Again the foot drop is a significant issue for her. 11/09/18 on evaluation today patient appears to be doing excellent in regard to her toe ulcer which in fact is completely healed. Fortunately we finally got the situation squared away with the paperwork which was needed to proceed with getting her brace approved by Medicaid. I have filled that out unfortunately that information has been sent to the orthopedic office that I worked at 2 1/2 years ago and not tired Current wound care measures. Fortunately she seems to be doing very well at this time. 11/23/18 on evaluation today patient appears to be doing More Poorly Compared to Last Time I Saw Her. At [facility] She Had Completely Healed. Currently she is continuing to have issues with reopening. She states that she just found  out that the brace was approved through Medicaid now she just has to go get measured in order to have this fitted for her and then made. Subsequently she does not have an appointment for this yet that is going to complicate things we obviously cannot put her back in the cast if we do not have everything measured because they're not gonna be able to measure her foot while she is in the cast. 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 Obviously this is unfortunate and does have me somewhat worried as well. 11/30/18 on evaluation today patient's toe ulcer actually appears to be doing fairly well. The good news is she will be getting her brace in the shoes next week on Wednesday. Hopefully we will be able to get this to heal without having to go back in the cast however she may need the cast in order to get the wound completely heal and then go from there. Fortunately there's no signs of infection at this time. 12/07/18 on evaluation today patient fortunately did receive her brace and she states she could tell this definitely makes her walk better. With that being said she's been having issues with her toe where she noticed yesterday there was a lot of tissue that was loosing off this appears to be much larger than what it was previous. She also states that her leg has been read putting much across the top of her foot just about the ankle although this seems to be receiving somewhat. The total area is still red and appears to be someone infected as best I can tell. She is previously taken Bactrim and that may be a good option for her today as well. We are gonna see what I wound culture shows as well and I think that this is definitely appropriate. With that being said outside of the culture I still need to initiate something in the interim and that's what I'm gonna go ahead and select Bactrim is a good option  for her. 12/14/18 on evaluation today patient appears to be doing  better in regard to her left great toe ulcer as compared to last week's evaluation. There's still some erythema although this is significantly improved which is excellent news. Overall I do believe that she is making good progress is still gonna take some time before she is where I would like her to be from the standpoint of being able to place her back into the total contact cast. Hopefully we will be where we need to be by next week. 12/21/18 on evaluation today patient actually appears to be doing poorly in regard to her toe ulcer. She's been tolerating the dressing changes without complication. Fortunately there's no signs of systemic infection although she does have a lot of drainage from the toe ulcer and this does seem to be causing some issues at this point. She does have erythema on the distal portion of her toe that appears to be likely cellulitis. 12/28/18 on evaluation today patient actually appears to be doing a little better in my pinion in regard to her toe ulcer. With that being said she still does have some evidence of infection at this time and for her culture she had both E. coli as well as enterococcus as organisms noted on evaluation. For that reason I think that though the Keflex likely has treated the E. coli rather well this has really done nothing for the enterococcus. We are going to have to initiate treatment for this specifically. [name], [name] ([id number]) 01/04/19 on evaluation today patient's toe actually appears to be doing better from the standpoint of infection. She currently would like to see about putting the cash back on I think that this is appropriate as long as she takes care of it and keeps it from getting wet. She is gonna have some drainage we can definitely pass this up with Drawtex and alginate to try to prevent as much drainage as possible from causing the problems. With that being said I do  want to at least try her with the cast between now and Tuesday. If there any issues we can't continue to use it then I will discontinue the use of the cast at that point. 01/08/19 on evaluation today patient actually appears to be doing very well as far as her foot ulcer specifically the great toe on the left is concerned. She did have an area of rubbing on the medial aspect of her left ankle which again is from the cast. Fortunately there's no signs of infection at this point in this appears to be a very slight skin breakdown. The patient tells me she felt it rubbing but didn't think it was that bad. Fortunately there is no signs of active infection at this time which is good news. No fevers, chills, nausea, or vomiting noted at this time. 01/15/19 on evaluation today patient actually appears to be doing well in regard to her toe ulcer. Again as previous she seems to do well and she has the cast on which indicates to me that during the time she doesn't have a cast on she's putting way too much pressure on this region. Obviously I think that's gonna be an issue as with the current national emergency concerning the Covid-19 Virus it has been recommended that we discontinue the use of total contact casting by the chief medical officer of our company, Dr. Simona Huh. The reasoning is that if a patient becomes sick and cannot come into have the cast removed they could not just leave this on for an additional two weeks.  Obviously the hospitals also do not want to receive patient's who are sick into the emergency department to potentially contaminate the region and spread the Covid-19 Virus among other sick individuals within the hospital system. Therefore at this point we are suspending the use of total contact cast until the current emergency subsides. This was all discussed with the patient today as well. 01/22/19 on evaluation today patient's wound on her left great toe appears to be doing slightly worse than  previously noted last week. She tells me that she has been on this quite a bit in fact she tells me she's been awake for 38 straight hours. This is due to the fact that she's having to care for grandparents because nobody else will. She has been taking care of them for five the last seven days since I've seen her they both have dementia his is from a stroke and her grandmother's was progressive. Nonetheless she states even her mom who knows her condition and situation has only help two of those days to take care of them she's been taking care of the rest. Fortunately there does not appear to be any signs of active infection in regard to her toe at this point although obviously it doesn't look as good as it did previous. I think this is directly related to her not taking off the pressure and friction by way of taking things easy. Though I completely understand what's going on. 01/29/19 on evaluation today patient's tools are actually appears to be showing some signs of improvement today compared to last week's evaluation as far as not necessarily the overall size of the wound but the fact that she has some new skin growth in between the two ends of the wound opening. Overall I feel like she has done well she states that she had a family member give her what sounds to be a CAM walker boot which has been helpful as well. 02/05/19 on evaluation today patient's wound bed actually appears to be doing significantly better in regard to her overall appearance of the size of the wound. With that being said she is still having an issue with offloading efficiently enough to get this to close. Apparently there is some signs of infection at this point as well unfortunately. Previously she's done well of Augmentin I really do not see anything that needs to be culture currently but there theme and cellulitis of the foot that I'm seeing I'm gonna go ahead and place her on an antibiotic today to try to help clear  this up. 02/12/2019 on evaluation today patient actually appears to be doing poorly in regard to her overall wound status. She tells me she has been using her offloading shoe but actually comes in today wearing her tennis shoe with the AFO brace. Again as I previously discussed with her this is really not sufficient to allow the area to heal appropriately. Nonetheless she continues to be somewhat noncompliant and I do wonder based on what she has told my nurse in the past as to whether or not a good portion of this noncompliance may be recreational drug and alcohol related. She has had a history of heroin overdose and this was fairly recently in the past couple of months that have been seeing her. Nonetheless overall I feel like her wound looks significantly worse today compared to what it was previous. She still has significant erythema despite the Augmentin I am not sure that this is an appropriate medication for her infection  I am also concerned that the infection may have gone down into her bone. 02/19/19 on evaluation today patient actually appears to be doing about the same in regard to her toe ulcer. Unfortunately she continues to show signs of bone exposure and infection at this point. There does not appear to be any evidence of worsening of the infection but I'm also not really sure that it's getting significantly better. She is on the Augmentin which should be sufficient for the Staphylococcus aureus infection that she has at this point. With that being said she may need IV antibiotics to more appropriately treat this. We did have a discussion today about hyperbaric option therapy. 02/28/19 on evaluation today patient actually appears to be doing much worse in regard to the wound on her left great toe as compared to even my previous evaluation last week. Unfortunately this seems to be training in a pretty poor direction. Her toe was actually now starting to angle laterally and I can actually  see the entire joint area of the proximal portion of the digit where is the distal portion of the digit again is no longer even in contact with the joint line. Unfortunately there's a lot more necrotic tissue around the edge and the toe appears to be showing signs of becoming gangrenous in my pinion. I'm very concerned about were things stand at this point. She did see infectious disease and they are planning to send in a prescription for Sivextro for her and apparently this has been approved. With that being said I don't think she should avoid taking this but at the same time I'm not sure that it's gonna be sufficient to save her toe at this point. 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 03/05/19 on evaluation today patient actually appears to be doing quite well in regard to her toe all things considering. She still has bone exposed but there appears to be much less your thing on overall the appearance of the wound and the toe itself is dramatically improved. She still does have some issues currently obviously with infection she did see vascular as well and there concerned that her blood flow to the toad. For that reason they are setting up for an angiogram next week. 03/14/19 on evaluation today patient appears to be doing very poor in regard to her toe and specifically in regard to the ulceration and the fact that she's starting to notice the toe was leaning even more towards the lateral aspect and the complete joint is visible on the proximal aspect of the joint. Nonetheless she's also noted a significant odor and the tip of the toe is turning more dark and necrotic appearing. Overall I think she is getting worse not better as far as this is concerned. For that reason I am recommending at this  point that she likely needs to be seen for likely amputation. READMISSION 03/19/2021 This is a patient that we cared for in this clinic for a prolonged period of time in 2019 and 2020 with a left foot and left first toe wound. 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 ([id number]) 04/09/20 by Dr. Luana Shu. In December  2021 she had an ulcer on her right great toe as well as the fourth and fifth toes. 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 She also had an angiogram at that time and underwent angioplasty of the right anterior tibial artery. In any case she claims that the wound on the right foot is closed I did not look at this today which was probably an oversight although I think that should be done next week. After her surgery she developed a dehiscence but I do not see any follow-up of this. According to Dr. Deborra Medina last review that she was out of the area being cared for by another physician but recently came back to his attention. 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 A culture of this area showed E. coli apparently before she came back to see Dr. Luana Shu she was supposed to be receiving antibiotics but she did not really take them. Nor is she offloading this area at all. Finally her last hemoglobin A1c listed in epic was in March 2022 at 14.1 she says things are a lot better since then although I am not sure. 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 She had this wound at the time. During this admission she had E. coli sepsis a TEE was negative. Past medical history is extensive and includes type 2 diabetes with peripheral neuropathy cardiomyopathy with an ejection fraction of 33%, hypertension, hyperlipidemia chronic renal failure stage  III history of substance abuse with cocaine although she claims to be clean now verified by her mother. She is still a heavy cigarette smoker. She has a history of bipolar disorder seizure disorder ABI in our clinic was 1.05 6/1; left midfoot in the setting of a TMA done previously. Round circular wound with a "knuckle" of protruding tissue. The problem is that the knuckle was not attached to any of the surrounding granulation and this probed proximally widely I removed a large portion of this tissue. This wound goes with considerable undermining laterally. I do not feel any bone there was no purulence but this is a deep wound. 6/8; in spite of the debridement I did last week. She arrives with a wound looking exactly the same. A protruding "knuckle" of tissue nonadherent to most of the surrounding tissue. There is considerable depth around this from 6-12 o'clock at 2.7 cm and undermining of 1 cm. This does not look overtly infected and the x-ray I did last week was negative for any osseous abnormalities. We have been using silver collagen 6/15; deep tissue culture I did last week showed moderate staph aureus and moderate Pseudomonas. This will definitely require prolonged antibiotic therapy. The pathology on the protuberant area was negative for malignancy fungus etc. 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 We have been using silver collagen. I am going to be prescribing Levaquin for 2 weeks. Her CT scan of the foot is down for 7/5 6/22; CT scan of the foot on 7 5. She says she has hardware in the left leg from her previous fracture. She is on the Levaquin for the deep tissue culture I did that showed methicillin sensitive staph aureus and Pseudomonas. I gave her a 2-week supply and she will have another week. She arrives in clinic today with the same protuberant tissue however this is nonadherent to the tissue surrounding it. I am really  at a loss to explain this unless there is underlying deep tissue infection 6/29; patient presents for 1 week follow-up. She has been using collagen to the wound bed. She reports taking her antibiotics as prescribed.She has no complaints or issues today. She denies signs of infection. 7/6; patient presents for one week followup. She has been using collagen to the wound bed. She states she is taking Levaquin however at times she is not able to keep it down. She denies signs of infection. 7/13; patient presents for 1 week follow-up. She has been using silver alginate to the wound bed. She still has nausea when taking her antibiotics. She denies signs of infection. 7/20; patient presents for 1 week follow-up. She has been using silver alginate with gentamicin cream to the wound bed. She denies any issues and has no complaints today. She denies signs of infection. 7/27; patient presents for 1 week follow-up. She continues to use silver alginate with gentamicin cream to the wound bed. She reports starting her antibiotics. She has no issues or complaints. Overall she reports stability to the wound. 8/3; patient presents for 1 week follow-up. She has been using silver alginate with gentamicin cream to the wound bed. She reports completing all antibiotics. She has no issues or complaints today. She denies signs of infection. 8/17; patient presents for 2-week follow-up. He is to use silver alginate to the wound bed. She has no issues or complaints today. She denies signs of infection. She reports her pain has improved in her foot since last clinic visit 8/24; patient presents for 1 week follow-up. She continues to use silver alginate to the wound bed. She has no issues or complaints. She denies signs of infection. Pain is stable. 9/7; patient presents for follow-up. She missed her last week appointment due to feeling ill. She continues to use silver alginate. She has a new wound to the right lower extremity  that is covered in eschar. She states It occurred over the past week and has no idea how it started. She currently denies signs of infection. 9/14; patient presents for follow-up. To the left foot wound she has been using gentamicin cream and silver alginate. To the right lower extremity wound she has been keeping this covered and has not obtain Santyl. 9/21; patient presents for follow-up. She reports using gentamicin cream and silver alginate to the left foot and Santyl to the right lower extremity wound. She has no issues or complaints today. She denies signs of infection. 9/28; patient presents for follow-up. She reports a new wound to her right heel. She states this occurred a few days ago and is progressively gotten worse. She has been trying to clean the area with a Q-tip and Santyl. She reports stability in the other 2 wounds. She has been using gentamicin cream and silver alginate to the left foot and Santyl to the right lower extremity wound. 10/12; patient presents for follow-up. She reports improvement to the wound beds. She is seeing vein and vascular to discuss the potential of a left BKA. She states they are going to do an arteriogram. She continues to use silver alginate with dressing changes to her wounds. 11/2; patient presents for follow-up. She states she has not been doing dressing changes to the wound beds. She states she is not able to offload the areas. She reports chronic pain to her left foot wound. 11/9; patient presents for follow-up. She came in with only socks on. She states she forgot to put on shoes.  It is unclear if she is doing any dressing changes. She currently denies systemic signs of infection. 11/16; patient presents for follow-up. She came again only with socks on. She states she does not wear shoes ever. It is unclear if she does dressing changes. She currently denies systemic signs of infection. 11/23; patient presents for follow-up. She wore her shoes  today. It still unclear exactly what dressing she is using for each wound but she did states she obtained Dakin's solution and has been using this to the left foot wound. She currently denies signs of infection. 11/30; patient presents for follow-up. She has no issues or complaints today. She currently denies signs of infection. 12/7; patient presents for follow-up. She has no issues or complaints today. She has been using Hydrofera Blue to the right heel wound and Dakin solution to the left foot wound. Her right anterior leg wound is healed. She currently denies signs of infection. [name], [name] ([id number]) 12/14; patient presents for follow-up. She has been using Hydrofera Blue to the right heel and Dakin's to the left foot wounds. She has no issues or complaints today. She denies signs of infection. 12/21; patient presents for follow-up. She reports using Hydrofera Blue to the right heel and Dakin's to the left foot wound. She denies signs of infection. 12/28; patient presents for follow-up. She continues to use Dakin's to the left foot wound and Hydrofera Blue to the right heel wound. She denies signs of infection. 1/4; patient presents for follow-up. She has no issues or complaints today. She denies signs of infection. 1/11; patient presents for follow-up. It is unclear if she has been dressing these wounds over the past week. She currently denies signs of infection. 1/18; patient presents for follow-up. She states she has been using Dakin's wet-to-dry dressings to the left foot. She has been using Hydrofera Blue to the right foot foot wound. She states that the anterior right leg wound has reopened and draining serous fluid. She denies signs of infection. 1/25; patient presents for follow-up. She has no issues or complaints today. 2/1; patient presents for follow-up. She has no issues or complaints today. She denies signs of infection. 2/8; patient presents for follow-up. She has lost  her surgical shoes. She did not have a dressing to the right heel wound. She currently denies signs of infection. 2/15; patient presents for follow-up. She reports more pain to the right heel today. She denies purulent drainage Or fever/chills 2/22; patient presents for follow-up. She reports taking clindamycin over the past week. She states that she continues to have pain to her right heel. She reports purulent drainage. Readmission 03/16/2022 Ms. Faline Langer is a 46 year old female with a past medical history of type 2 diabetes, osteomyelitis to her feet, chronic systolic heart failure and bipolar disorder that presents to the clinic for bilateral feet wounds and right lower extremity wound. She was last seen in our clinic on 12/15/2021. At that time she had purulent drainage coming out of her right plantar foot and I recommended she go to the ED. She states she went to [facility] and has been there for the past 3 months. I cannot see the records. She states she had OR debridement and was on several weeks of IV antibiotics while inpatient. Since discharge she has not been taking care of the wound beds. She had nothing on her feet other than socks today. She currently denies signs of infection. Electronic Signature(s) Signed: 03/16/2022 3:29:15 PM By: Kalman Shan  DO Entered By: Kalman Shan on 03/16/2022 14:43:57 [name] ([id number]) -------------------------------------------------------------------------------- Physical Exam Details Patient Name: KONICKI, Shaunice A. Date of Service: 03/16/2022 12:45 PM Medical Record Number: [id number] Patient Account Number: [id number] Date of Birth/Sex: Oct 11, 1976 (46 y.o. Female) Treating RN: Cornell Barman Primary Care Provider: Raelene Bott Other Clinician: Referring Provider: Raelene Bott Treating Provider/Extender: Yaakov Guthrie in Treatment: 0 Constitutional . Cardiovascular . Psychiatric . Notes Right  lower extremity: To the anterior aspect there is an open wound with granulation tissue and scant nonviable tissue. To the right plantar heel there is an incision site with increased depth throughout. No probing to bone. Left lower extremity: To the medial aspect there is an open wound with granulation tissue and nonviable tissue present. No surrounding signs of infection to any of the wound beds. Electronic Signature(s) Signed: 03/16/2022 3:29:15 PM By: Kalman Shan DO Entered By: Kalman Shan on 03/16/2022 14:45:49 [name] ([id number]) -------------------------------------------------------------------------------- Physician Orders Details Patient Name: Heather Dear A. Date of Service: 03/16/2022 12:45 PM Medical Record Number: [id number] Patient Account Number: [id number] Date of Birth/Sex: [birth year] (46 y.o. Female) Treating RN: Cornell Barman Primary Care Provider: Raelene Bott Other Clinician: Referring Provider: Raelene Bott Treating Provider/Extender: Yaakov Guthrie in Treatment: 0 Verbal / Phone Orders: No Diagnosis Coding ICD-10 Coding Code Description [phone] 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ity 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.621 Type 2 diabetes mellitus with foot ulcer E11.42 Type 2 diabetes mellitus with diabetic polyneuropathy L97.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 of skin Follow-up Appointments o Return Appointment in 1 week. Bathing/ Shower/ Hygiene o Wash wounds with antibacterial soap and water. o No tub bath. Anesthetic (Use 'Patient Medications' Section for Anesthetic Order Entry) o Lidocaine applied to wound bed Edema Control - Lymphedema / Segmental Compressive Device / Other o Elevate, Exercise Daily and Avoid Standing for Long Periods of Time. o Elevate legs to the  level of the heart and pump ankles as often as possible o Elevate leg(s) parallel to the floor when sitting. Off-Loading o Other: - keep pressure off of feet. Wound Treatment Wound #11 - Foot Wound Laterality: Plantar, Right Cleanser: Soap and Water 1 x Per Day/30 Days Discharge Instructions: Gently cleanse wound with antibacterial soap, rinse and pat dry prior to dressing wounds Primary Dressing: Gauze 1 x Per Day/30 Days Discharge Instructions: As directed: moistened with Dakins Solution Secondary Dressing: ABD Pad 5x9 (in/in) 1 x Per Day/30 Days Discharge Instructions: Cover with ABD pad Secured With: Medipore Tape - 665M Medipore H Soft Cloth Surgical Tape, 2x2 (in/yd) 1 x Per Day/30 Days Secured With: Kerlix Roll Sterile or Non-Sterile 6-ply 4.5x4 (yd/yd) 1 x Per Day/30 Days Discharge Instructions: Apply Kerlix as directed Wound #12 - Foot Wound Laterality: Plantar, Left, Medial Cleanser: Soap and Water 1 x Per Day/30 Days Discharge Instructions: Gently cleanse wound with antibacterial soap, rinse and pat dry prior to dressing wounds Primary Dressing: Gauze 1 x Per Day/30 Days Discharge Instructions: As directed: moistened with Dakins Solution Secondary Dressing: ABD Pad 5x9 (in/in) 1 x Per Day/30 Days Discharge Instructions: Cover with ABD pad Secured With: Medipore Tape - 665M Medipore H Soft Cloth Surgical Tape, 2x2 (in/yd) 1 x Per Day/30 Days [place], Jacquelynne A. ([id number]) Secured With: Kerlix Roll Sterile or Non-Sterile 6-ply 4.5x4 (yd/yd) 1 x Per Day/30 Days Discharge Instructions: Apply Kerlix as directed Wound #  13 - Lower Leg Wound Laterality: Right, Medial Cleanser: Soap and Water 1 x Per Day/30 Days Discharge Instructions: Gently cleanse wound with antibacterial soap, rinse and pat dry prior to dressing wounds Topical: Triple Antibiotic Ointment, 1 (oz) Tube 1 x Per Day/30 Days Primary Dressing: (BORDER) Zetuvit Plus Silicone Border Dressing 4x4 (in/in) 1 x Per Day/30  Days Laboratory o Bacteria identified in Wound by Culture (MICRO) - right plantar oooo LOINC Code: 8341-9 oooo Convenience Name: Wound culture routine Patient Medications Allergies: erythromycin base, Neosporin (neo-bac-polym) Notifications Medication Indication Start End Dakin's Solution 03/16/2022 DOSE 1 - miscellaneous 0.25 % solution - apply to gauze for wet to dry dressings Electronic Signature(s) Signed: 03/17/2022 4:52:14 PM By: Kalman Shan DO Signed: 03/17/2022 5:36:30 PM By: Gretta Cool, BSN, RN, CWS, [name] RN, BSN Previous Signature: 03/16/2022 3:24:41 PM Version By: Kalman Shan DO Entered By: Gretta Cool BSN, RN, CWS, [name] on 03/17/2022 09:55:42 Segov[place], Alima A. ([id number]) -------------------------------------------------------------------------------- Problem List Details Patient Name: Heather Dear A. Date of Service: 03/16/2022 12:45 PM Medical Record Number: [id number] Patient Account Number: [id number] Date of Birth/Sex: Aug 30, 1976 (46 y.o. Female) Treating RN: Cornell Barman Primary Care Provider: Raelene Bott Other Clinician: Referring Provider: Raelene Bott Treating Provider/Extender: Yaakov Guthrie in Treatment: 0 Active Problems ICD-10 Encounter Code Description 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/2022 No Yes severity L97.512 Non-pressure chronic ulcer of other part of right foot with fat layer 03/16/2022 No Yes exposed E11.621 Type 2 diabetes mellitus with foot ulcer 03/16/2022 No Yes E11.42 Type 2 diabetes mellitus with diabetic polyneuropathy 03/16/2022 No Yes L97.811 Non-pressure chronic ulcer of other part of right lower leg limited to 03/16/2022 No Yes breakdown of skin Inactive Problems Resolved Problems Electronic Signature(s) Signed: 03/16/2022 3:29:15 PM By: Kalman Shan DO Entered By: Kalman Shan on 03/16/2022 14:42:19 [name], Maddilynn A.  ([id number]) -------------------------------------------------------------------------------- Progress Note Details Patient Name: Heather Dear A. Date of Service: 03/16/2022 12:45 PM Medical Record Number: [id number] Patient Account Number: [id number] Date of Birth/Sex: Feb 27, 1976 (46 y.o. Female) Treating RN: Cornell Barman Primary Care Provider: Raelene Bott Other Clinician: Referring Provider: Raelene Bott Treating Provider/Extender: Yaakov Guthrie in Treatment: 0 Subjective Chief Complaint Information obtained from Patient 03/19/2021; patient referred by Dr. Luana Shu who has been looking after her left foot for quite a period of time for review of a nonhealing area in the left midfoot 03/12/2022; bilateral feet wounds and right lower extremity wound. History of Present Illness (HPI) 01/18/18-She is here for initial evaluation of the left great toe ulcer. She is a poor historian in regards to timeframe in detail. She states approximately 4 weeks ago she lacerated her toe on something in the house. She followed up with her primary care who placed her on Bactrim and ultimately a second dose of Bactrim prior to coming to wound clinic. She states she has been treating the toe with peroxide, Betadine and a Band-Aid. She did not check her blood sugar this morning but checked it yesterday morning it was 327; she is unaware of a recent A1c and there are no current records. She saw Dr. she would've orthopedics last week for an old injury to the left ankle, she states he did not see her toe, nor did she bring it to his attention. She smokes approximately 1 pack cigarettes a day. Her social situation is concerning, she arrives this morning with her mother who appears extremely intoxicated/under the influence; her mother was asked to leave the  room and be monitored by the patient's grandmother. The patient's aunt then accompanied the patient and the room throughout the rest of the appointment. We had  a lengthy discussion regarding the deleterious effects of uncontrolled hyperglycemia and smoking as it relates to wound healing and overall health. She was strongly encouraged to decrease her smoking and get her diabetes under better control. She states she is currently on a diet and has cut down her [facility] consumption. The left toe is erythematous, macerated and slightly edematous with malodor present. The edema in her left foot is below her baseline, there is no erythema streaking. We will treat her with Santyl, doxycycline; we have ordered and xray, culture and provided a Peg assist surgical shoe and cultured the wound. 01/25/18-She is here in follow-up evaluation for a left great toe ulcer and presents with an abscess to her suprapubic area. She states her blood sugars remain elevated, feeling "sick" and if levels are below 250, but she is trying. She has made no attempt to decrease her smoking stating that we "can't take away her food in her cigarettes". She has been compliant with offloading using the PEG assist you. She is using Santyl daily. the culture obtained last week grew staph aureus and Enterococcus faecalis; continues on the doxycycline and Augmentin was added on Monday. The suprapubic area has erythema, no femoral variation, purple discoloration, minimal induration, was accessed with a cotton tip applicator with sanguinopurulent drainage, this was cultured, I suspect the current antibiotic treatment will cover and we will not add anything to her current treatment plan. She was advised to go to urgent care or ER with any change in redness, induration or fever. 02/01/18-She is here in follow-up evaluation for left great toe ulcers and a new abdominal abscess from last week. She was able to use packing until earlier this week, where she "forgot it was there". She states she was feeling ill with GI symptoms last week and was not taking her antibiotic. She states her glucose levels have  been predominantly less than 200, with occasional levels between 200-250. She thinks this was contributing to her GI symptoms as they have resolved without intervention. There continues to be significant laceration to left toe, otherwise it clinically looks stable/improved. There is now less superficial opening to the lateral aspect of the great toe that was residual blister. We will transition to [facility] to all wounds, she will continue her Augmentin. If there is no change or deterioration next week for reculture. 02/08/18-She is here in follow-up evaluation for left great toe ulcer and abdominal ulcer. There is an improvement in both wounds. She has been wrapping her left toe with coban, not by our direction, which has created an area of discoloration to the medial aspect; she has been advised to NOT use coban secondary to her neuropathy. She states her glucose levels have been high over this last week ranging from 200-350, she continues to smoke. She admits to being less compliant with her offloading shoe. We will continue with same treatment plan and she will follow-up next week. 02/15/18-She is here in follow-up evaluation for left great toe ulcer and abdominal ulcer. The abdominal ulcer is epithelialized. The left great toe ulcer is improved and all injury from last week using the Coban wrap is resolved, the lateral ulcer is healed. She admits to noncompliance with wearing offloading shoe and admits to glucose levels being greater than 300 most of the week. She continues to smoke and expresses no  desire to quit. There is one area medially that probes deeper than it has historically, erythema to the toe and dorsal foot has consistently waxed and waned. There is no overt signs of cellulitis or infection but we will culture the wound for any occult infection given the new area of depth and erythema. We will hold off on sensitivities for initiation of antibiotic therapy. 02/22/18-She is here in  follow up evaluation for left great toe ulcer. There is overall significant improvement in both wound appearance, erythema and edema with changes made last week. She was not initiated on antibiotic therapy. Culture obtained last week showed oxacillin sensitive staph aureus, sensitive to clindamycin. Clindamycin has been called into the pharmacy but she has been instructed to hold off on initiation secondary to overall clinical improvement and her history of antibiotic intolerance. She has been instructed to contact the clinic with any noted changes/deterioration and the wound, erythema, edema and/or pain. She will follow-up next week. She continues to smoke and her glucose levels remain elevated >250; she admits to compliance with offloading shoe 03/01/18 on evaluation today patient appears to be doing fairly well in regard to her left first toe ulcer. She has been tolerating the dressing changes with the [facility] Dressing without complication and overall this has definitely showed signs of improvement according to records as well is what the patient tells me today. I'm very pleased in that regard. She is having no pain today 03/08/18 She is here for follow up evaluation of a left great toe ulcer. She remains non-compliant with glucose control and smoking cessation; glucose levels consistently >200. She states that she got new shoe inserts/peg assist. She admits to compliance with offloading. Since my last evaluation there is significant improvement. We will switch to prisma at this time and she will follow up next week. She is noted to be tachycardic at this appointment, heart rate 120s; she has a history of heart rate 70-130 according to our records. She admits to extreme agitation r/t personal issues; she was advised to monitor her heartrate and contact her physician if it does not return to a more normal range (<100). She takes cardizem twice daily. 03/15/18-She is here in follow-up evaluation  for left great toe ulcer. She remains noncompliant with glucose control and smoking cessation. She admits to compliance with wearing offloading shoe. The ulcer is improved/stable and we will continue with the same treatment plan and she will follow-up next week 03/22/18-She is here for evaluation for left great toe ulcer. There continues to be significant improvement despite recurrent hyperglycemia (over 500 yesterday) and she continues to smoke. She has been compliant with offloading and we will continue with same treatment plan and she will follow-up next week. [place], Dayna A. ([id number]) 03/29/18-She is here for evaluation for left great toe ulcer. Despite continuing to smoke and uncontrolled diabetes she continues to improve. She is compliant with offloading shoe. We will continue with the same treatment plan and she will follow-up next week 04/05/18- She is here in follow up evaluation for a left great toe ulcer; she presents with small pustule to left fifth toe (resembles ant bite). She admits to compliance with wearing offloading shoe; continues to smoke or have uncontrolled blood glucose control. There is more callus than usual with evidence of bleeding; she denies known trauma. 04/12/18-She is here for evaluation of left great toe ulcer. Despite noncompliance with glycemic control and smoking she continues to make improvement. She continues to wear offloading shoe. The  pustule, that was identified last week, to the left fifth toe is resolved. She will follow-up in 2 weeks 05/03/18-she is seen in follow-up evaluation for a left great toe ulcer. She is compliant with offloading, otherwise noncompliant with glycemic control and smoking. She has plateaued and there is minimal improvement noted. We will transition to [facility], replaced the insert to her surgical shoe and she will follow-up in one week 05/10/18- She is here in follow up evaluation for a left great toe ulcer. It appears stable  despite measurement change. We will continue with same treatment plan and follow up next week. 05/24/18-She is seen in follow-up evaluation for a left great toe ulcer. She remains compliant with offloading, has made significant improvement in her diet, decreasing the amount of sugar/soda. She said her recent A1c was 10.9 which is lower than. She did see a diabetic nutritionist/educator yesterday. She continues to smoke. We will continue with the same treatment plan and she'll follow-up next week. 05/31/18- She is seen in follow-up evaluation for left great toe ulcer. She continues to remain compliant with offloading, continues to make improvement in her diet, increasing her water and decreasing the amount of sugar/soda. She does continue to smoke with no desire to quit. We will apply Prisma to the depth and Hydrofera Blue over. We have not received insurance authorization for oasis. She will follow up next week. 06/07/18-She is seen in follow-up evaluation for left great toe ulcer. It has stalled according to today's measurements although base appears stable. She says she saw a diabetic educator yesterday; her average blood sugars are less than 300 which is an improvement for her. She continues to smoke and states "that's my next step" She continues with water over soda. We will order for xray, culture and reinstate ace wrap compression prior to placing apligraf for next week. She is voicing no complaints or concerns. Her dressing will change to iodoflex over the next week in preparation for apligraf. 06/14/18-She is seen in follow-up evaluation for left great toe ulcer. Plain film x-ray performed last week was negative for osteomyelitis. Wound culture obtained last week grew strep B and OSSA; she is initiated on keflex and cefdinir today; there is erythema to the toe which could be from ace wrap compression, she has a history of wrapping too tight and has has been encouraged to maintain ace wraps that we  place today. We will hold off on application of apligraf today, will apply next week after antibiotic therapy has been initiated. She admits today that she has resumed taking a shower with her foot/toe submerged in water, she has been reminded to keep foot/toe out of the bath water. She will be seen in follow up next week 06/21/18-she is seen in follow-up evaluation for left great toe ulcer. She is tolerating antibiotic therapy with no GI disturbance. The wound is stable. Apligraf was applied today. She has been decreasing her smoking, only had 4 cigarettes yesterday and 1 today. She continues being more compliant in diabetic diet. She will follow-up next week for evaluation of site, if stable will remove at 2 weeks. 06/28/18- She is here in follow up evalution. Apligraf was placed last week, she states the dressing fell off on Tuesday and she was dressing with hydrofera blue. She is healed and will be discharged from the clinic today. She has been instructed to continue with smoking cessation, continue monitoring glucose levels, offloading for an additional 4 weeks and continue with hydrofera blue for additional two  weeks for any possible microscopic opening. Readmission: 08/07/18 on evaluation today patient presents for reevaluation concerning the ulcer of her right great toe. She was previously discharged on 06/28/18 healed. Nonetheless she states that this began to show signs of drainage she subsequently went to her primary care provider. Subsequently an x-ray was performed on 08/01/18 which was negative. The patient was also placed on antibiotics at that time. Fortunately they should have been effective for the infection. Nonetheless she's been experiencing some improvement but still has a lot of drainage coming from the wound itself. 08/14/18 on evaluation today patient's wound actually does show signs of improvement in regard to the erythema at this point. She has completed the antibiotics. With  that being said we did discuss the possibility of placing her in a total contact cast as of today although I think that I may want to give this just a little bit more time to ensure nothing recurrence as far as her infection is concerned. I do not want to put in the cast and risk infection at that time if things are not completely resolved. With that being said she is gonna require some debridement today. 08/21/18 on evaluation today patient actually appears to be doing okay in regard to her toe ulcer. She's been tolerating the dressing changes without complication. With that being said it does appear that she is ready and in fact I think it's appropriate for [place] to go ahead and initiate the total contact cast today. Nonetheless she will require some sharp debridement to prepare the wound for application. Overall I feel like things have been progressing well but we do need to do something to get this to close more readily. 08/24/18 patient seen today for reevaluation after having had the total contact cast applied on Tuesday. She seems to have done very well the wound appears to be doing great and overall I'm pleased with the progress that she's made. There were no abnormal areas of rubbing from the cast on her lower extremity. 08/30/18 on evaluation today patient actually appears to be completely healed in regard to her plantar toe ulcer. She tells me at this point she's been having a lot of issues with the cast. She almost fell a couple of times the state shall the step of her dog a couple times as well. This is been a very frustrating process for her other nonetheless she has completely healed the wound which is excellent news. Overall there does not appear to be the evidence of infection at this time which is great news. 09/11/18 evaluation today patient presents for follow-up concerning her great toe ulcer on the left which has unfortunately reopened since I last saw her which was only a couple of  weeks ago. Unfortunately she was not able to get in to get the shoe and potentially the AFO that's gonna be necessary due to her left foot drop. She continues with offloading shoe but this is not enough to prevent her from reopening it appears. When we last had her in the total contact cast she did well from a healing standpoint but unfortunately the wound reopened as soon as she came out of the cast within just a couple of weeks. 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 This is  also part of the reason why she falls on a regular basis. I just do not believe that is gonna be safe for [place] to reinitiate the total contact cast as last time we had this on she fell 3 times one week which is definitely not normal for her. 09/18/18 upon evaluation today the patient actually appears to be doing about the same in regard to her toe ulcer. She did not contact [facility] as I asked her to even though I had given her the prescription. In fact she actually states that she has no idea where the prescription is. She did apparently call [facility] and they told her that all she needed to do was bring the prescription in order to be able to be seen and work on getting the AFO for her left foot. With all that being said she still does not have an appointment and I'm not sure were things stand that regard. I will give her a new prescription today in order to contact them to get this set up. [name], [name] ([id number]) 09/25/18 on evaluation today patient actually appears to be doing about the same in regard to her toes ulcer. She does have a small areas which seems to have a lot of callous buildup around the edge of the wound which is going to need sharp debridement today. She still is waiting to be scheduled for evaluation with  [facility] for possibility of an AFO. She states there supposed to call her tomorrow to get this set up. Unfortunately it does appear that her foot specifically the toe area is showing signs of erythema. There does not appear to be any systemic infection which is in these good news. 10/02/18 on evaluation today patient actually appears to be doing about the same in regard to her toe ulcer. This really has not done too well although it's not significantly larger it's also not significantly smaller. She has been tolerating the dressing changes without complication. She actually has her appointment with [facility] and [place] tomorrow to hopefully be measured for obtaining and AFO splint. I think this would be helpful preventing this from reoccurring. We had contemplated starting the cast this week although to be honest I am reluctant to do that as she's been having nausea, vomiting, and seizure activity over the past three days. She has a history of seizures and have been told is nothing that can be done for these. With that being said I do believe that along with the seizures have the nausea vomiting which upon further questioning doesn't seem to be the normal for her and makes me concerned for the possibility of infection or something else going on. I discussed this with the patient and her mother during the office visit today. I do not feel the wound is effective but maybe something else. The responses this was "this just happens to her at times and we don't know why". They did not seem to be interested in going to the hospital to have this checked out further. 10/09/18 on evaluation today patient presents for follow-up concerning her ongoing toe ulcer. She has been tolerating the dressing changes without complication. Fortunately there does not appear to be any evidence of infection which is great news however I do think that the patient would benefit from going ahead for with the total contact cast.  She's actually in a wheelchair today she tells me that she will use her walker if we initiate the cast. I was very specific about the fact  that if we were gonna do the cast I wanted to make sure that she was using the walker in order to prevent any falls. She tells me she does not have stairs that she has to traverse on a regular basis at her home. She has not had any seizures since last week again that something that happens to her often she tells me she did talk to [employer] and they said that it may take up to three weeks to get the brace approved for her. Hopefully that will not take that long but nonetheless in the meantime I do think the cast could be of benefit. 10/12/18 on evaluation today patient appears to be doing rather well in regard to her toe ulcer. It's just been a few days and already this is significantly improved both as far as overall appearance and size. Fortunately there's no sign of infection. She is here for her first obligatory cast change. 10/19/18 Seen today for follow up and management of left great toe ulcer. Wound continues to show improvement. Noted small open area with seroussang drainage with palpation. Denies any increased pain or recent fevers during visit. She will continue calcium alginate with offloading shoe. Denies any questions or concerns during visit. 10/26/18 on evaluation today patient appears to be doing about the same as when I last saw her in regard to her wound bed. Fortunately there does not appear to be any signs of infection. Unfortunately she continues to have a breakdown in regard to the toe region any time that she is not in the cast. It takes almost no time at all for this to happen. Nonetheless she still has not heard anything from the brace being made by [employer] as to when exactly this will be available to her. Fortunately there is no signs of infection at this time. 10/30/18 on evaluation today patient presents for application of the total contact  cast as we just received him this morning. Fortunately we are gonna be able to apply this to her today which is great news. She continues to have no significant pain which is good news. Overall I do feel like things have been improving while she was the cast is when she doesn't have a cast that things get worse. She still has not really heard anything from [facility] regarding her brace. 11/02/18 upon evaluation today patient's wound already appears to be doing significantly better which is good news. Fortunately there does not appear to be any signs of infection also good news. Overall I do think the total contact cast as before is helping to heal this area unfortunately it's just not gonna likely keep the area closed and healed without her getting her brace at least. Again the foot drop is a significant issue for her. 11/09/18 on evaluation today patient appears to be doing excellent in regard to her toe ulcer which in fact is completely healed. Fortunately we finally got the situation squared away with the paperwork which was needed to proceed with getting her brace approved by Medicaid. I have filled that out unfortunately that information has been sent to the orthopedic office that I worked at 2 1/2 years ago and not tired Current wound care measures. Fortunately she seems to be doing very well at this time. 11/23/18 on evaluation today patient appears to be doing More Poorly Compared to Last Time I Saw Her. At [facility] She Had Completely Healed. Currently she is continuing to have issues with reopening. She states that she just  found out that the brace was approved through Medicaid now she just has to go get measured in order to have this fitted for her and then made. Subsequently she does not have an appointment for this yet that is going to complicate things we obviously cannot put her back in the cast if we do not have everything measured because they're not gonna be able to measure her foot  while she is in the cast. 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 Obviously this is unfortunate and does have me somewhat worried as well. 11/30/18 on evaluation today patient's toe ulcer actually appears to be doing fairly well. The good news is she will be getting her brace in the shoes next week on Wednesday. Hopefully we will be able to get this to heal without having to go back in the cast however she may need the cast in order to get the wound completely heal and then go from there. Fortunately there's no signs of infection at this time. 12/07/18 on evaluation today patient fortunately did receive her brace and she states she could tell this definitely makes her walk better. With that being said she's been having issues with her toe where she noticed yesterday there was a lot of tissue that was loosing off this appears to be much larger than what it was previous. She also states that her leg has been read putting much across the top of her foot just about the ankle although this seems to be receiving somewhat. The total area is still red and appears to be someone infected as best I can tell. She is previously taken Bactrim and that may be a good option for her today as well. We are gonna see what I wound culture shows as well and I think that this is definitely appropriate. With that being said outside of the culture I still need to initiate something in the interim and that's what I'm gonna go ahead and select Bactrim is a good option for her. 12/14/18 on evaluation today patient appears to be doing better in regard to her left great toe ulcer as compared to last week's evaluation. There's still some erythema although this is significantly improved which is excellent news. Overall I do believe that she is making good progress is still gonna take some time before she is where I would like her to be  from the standpoint of being able to place her back into the total contact cast. Hopefully we will be where we need to be by next week. 12/21/18 on evaluation today patient actually appears to be doing poorly in regard to her toe ulcer. She's been tolerating the dressing changes without complication. Fortunately there's no signs of systemic infection although she does have a lot of drainage from the toe ulcer and this does [name], [name] ([id number]) seem to be causing some issues at this point. She does have erythema on the distal portion of her toe that appears to be likely cellulitis. 12/28/18 on evaluation today patient actually appears to be doing a little better in my pinion in regard to her toe ulcer. With that being said she still does have some evidence of infection at this time and for her culture she had both E. coli as well as enterococcus as organisms noted on evaluation. For that reason I think that though the Keflex likely has treated  the E. coli rather well this has really done nothing for the enterococcus. We are going to have to initiate treatment for this specifically. 01/04/19 on evaluation today patient's toe actually appears to be doing better from the standpoint of infection. She currently would like to see about putting the cash back on I think that this is appropriate as long as she takes care of it and keeps it from getting wet. She is gonna have some drainage we can definitely pass this up with Drawtex and alginate to try to prevent as much drainage as possible from causing the problems. With that being said I do want to at least try her with the cast between now and Tuesday. If there any issues we can't continue to use it then I will discontinue the use of the cast at that point. 01/08/19 on evaluation today patient actually appears to be doing very well as far as her foot ulcer specifically the great toe on the left is concerned. She did have an area of rubbing on the medial  aspect of her left ankle which again is from the cast. Fortunately there's no signs of infection at this point in this appears to be a very slight skin breakdown. The patient tells me she felt it rubbing but didn't think it was that bad. Fortunately there is no signs of active infection at this time which is good news. No fevers, chills, nausea, or vomiting noted at this time. 01/15/19 on evaluation today patient actually appears to be doing well in regard to her toe ulcer. Again as previous she seems to do well and she has the cast on which indicates to me that during the time she doesn't have a cast on she's putting way too much pressure on this region. Obviously I think that's gonna be an issue as with the current national emergency concerning the Covid-19 Virus it has been recommended that we discontinue the use of total contact casting by the chief medical officer of our company, Dr. Simona Huh. The reasoning is that if a patient becomes sick and cannot come into have the cast removed they could not just leave this on for an additional two weeks. Obviously the hospitals also do not want to receive patient's who are sick into the emergency department to potentially contaminate the region and spread the Covid-19 Virus among other sick individuals within the hospital system. Therefore at this point we are suspending the use of total contact cast until the current emergency subsides. This was all discussed with the patient today as well. 01/22/19 on evaluation today patient's wound on her left great toe appears to be doing slightly worse than previously noted last week. She tells me that she has been on this quite a bit in fact she tells me she's been awake for 38 straight hours. This is due to the fact that she's having to care for grandparents because nobody else will. She has been taking care of them for five the last seven days since I've seen her they both have dementia his is from a stroke and her  grandmother's was progressive. Nonetheless she states even her mom who knows her condition and situation has only help two of those days to take care of them she's been taking care of the rest. Fortunately there does not appear to be any signs of active infection in regard to her toe at this point although obviously it doesn't look as good as it did previous. I think this is directly  related to her not taking off the pressure and friction by way of taking things easy. Though I completely understand what's going on. 01/29/19 on evaluation today patient's tools are actually appears to be showing some signs of improvement today compared to last week's evaluation as far as not necessarily the overall size of the wound but the fact that she has some new skin growth in between the two ends of the wound opening. Overall I feel like she has done well she states that she had a family member give her what sounds to be a CAM walker boot which has been helpful as well. 02/05/19 on evaluation today patient's wound bed actually appears to be doing significantly better in regard to her overall appearance of the size of the wound. With that being said she is still having an issue with offloading efficiently enough to get this to close. Apparently there is some signs of infection at this point as well unfortunately. Previously she's done well of Augmentin I really do not see anything that needs to be culture currently but there theme and cellulitis of the foot that I'm seeing I'm gonna go ahead and place her on an antibiotic today to try to help clear this up. 02/12/2019 on evaluation today patient actually appears to be doing poorly in regard to her overall wound status. She tells me she has been using her offloading shoe but actually comes in today wearing her tennis shoe with the AFO brace. Again as I previously discussed with her this is really not sufficient to allow the area to heal appropriately. Nonetheless she  continues to be somewhat noncompliant and I do wonder based on what she has told my nurse in the past as to whether or not a good portion of this noncompliance may be recreational drug and alcohol related. She has had a history of heroin overdose and this was fairly recently in the past couple of months that have been seeing her. Nonetheless overall I feel like her wound looks significantly worse today compared to what it was previous. She still has significant erythema despite the Augmentin I am not sure that this is an appropriate medication for her infection I am also concerned that the infection may have gone down into her bone. 02/19/19 on evaluation today patient actually appears to be doing about the same in regard to her toe ulcer. Unfortunately she continues to show signs of bone exposure and infection at this point. There does not appear to be any evidence of worsening of the infection but I'm also not really sure that it's getting significantly better. She is on the Augmentin which should be sufficient for the Staphylococcus aureus infection that she has at this point. With that being said she may need IV antibiotics to more appropriately treat this. We did have a discussion today about hyperbaric option therapy. 02/28/19 on evaluation today patient actually appears to be doing much worse in regard to the wound on her left great toe as compared to even my previous evaluation last week. Unfortunately this seems to be training in a pretty poor direction. Her toe was actually now starting to angle laterally and I can actually see the entire joint area of the proximal portion of the digit where is the distal portion of the digit again is no longer even in contact with the joint line. Unfortunately there's a lot more necrotic tissue around the edge and the toe appears to be showing signs of becoming gangrenous in my pinion.  I'm very concerned about were things stand at this point. She did see  infectious disease and they are planning to send in a prescription for Sivextro for her and apparently this has been approved. With that being said I don't think she should avoid taking this but at the same time I'm not sure that it's gonna be sufficient to save her toe at this point. 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 03/05/19 on evaluation today patient actually appears to be doing quite well in regard to her toe all things considering. She still has bone exposed but there appears to be much less your thing on overall the appearance of the wound and the toe itself is dramatically improved. She still does have some issues currently obviously with infection she did see vascular as well and there concerned that her blood flow to the toad. For that reason they are setting up for an angiogram next week. 03/14/19 on evaluation today patient appears to be doing very poor in regard to her toe and specifically in regard to the ulceration and the fact that she's starting to notice the toe was leaning even more towards the lateral aspect and the complete joint is visible on the proximal aspect of the joint. Nonetheless she's also noted a significant odor and the tip of the toe is turning more dark and necrotic appearing. Overall I think she is getting worse not better as far as this is concerned. For that reason I am recommending at this point that she likely needs to be seen for likely amputation. [name], [name] ([id number]) READMISSION 03/19/2021 This is a patient that we cared for in this clinic for a prolonged period of time in 2019 and 2020 with a left foot and left first toe wound. 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 Luana Shu. In December 2021 she had an ulcer on her right great toe as well as the fourth and fifth toes. 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 She also had an angiogram at that time and underwent angioplasty of the right anterior tibial artery. In any case she claims that the wound on the right foot is closed I did not look at this today which was probably an oversight although I think that should be done next week. After her surgery she developed a dehiscence but I do not see any follow-up of this. According to Dr. Deborra Medina last review that she was out of the area being cared for by another physician but recently came back to his attention. 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 A culture of this area showed E. coli apparently before she came back to see Dr. Luana Shu she was supposed to be receiving antibiotics but she did not really take them. Nor is she offloading this area at all. Finally her last hemoglobin A1c listed in epic was in March 2022 at 14.1 she says things are a lot better since then although I am not sure. 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 She had this wound at the  time. During this admission she had E. coli sepsis a TEE was negative. 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 She is still a heavy cigarette smoker. She has a history of bipolar disorder seizure disorder ABI in our clinic was 1.05 6/1; left midfoot in the setting of a TMA done previously. Round circular wound with a "knuckle" of protruding tissue. The problem is that  the knuckle was not attached to any of the surrounding granulation and this probed proximally widely I removed a large portion of this tissue. This wound goes with considerable undermining laterally. I do not feel any bone there was no purulence but this is a deep wound. 6/8; in spite of the debridement I did last week. She arrives with a wound looking exactly the same. A protruding "knuckle" of tissue nonadherent to most of the surrounding tissue. There is considerable depth around this from 6-12 o'clock at 2.7 cm and undermining of 1 cm. This does not look overtly infected and the x-ray I did last week was negative for any osseous abnormalities. We have been using silver collagen 6/15; deep tissue culture I did last week showed moderate staph aureus and moderate Pseudomonas. This will definitely require prolonged antibiotic therapy. The pathology on the protuberant area was negative for malignancy fungus etc. 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 We have been using silver collagen. I am going to be prescribing Levaquin for 2 weeks. Her CT scan of the foot is down for 7/5 6/22; CT scan of the foot on 7 5. She says she has hardware in the left leg from her previous fracture. She is on the Levaquin for the deep tissue culture I did that showed methicillin sensitive staph aureus and Pseudomonas. I gave her a 2-week supply and she will have another week. She arrives in clinic today with the same protuberant tissue however this is nonadherent to the tissue surrounding it. I am really at a loss to explain this unless there is underlying deep tissue infection 6/29; patient presents for 1 week follow-up. She has been using collagen to the wound bed. She reports taking her antibiotics as prescribed.She has no complaints or issues today. She denies signs of infection. 7/6; patient presents for one week followup. She has been using collagen to the  wound bed. She states she is taking Levaquin however at times she is not able to keep it down. She denies signs of infection. 7/13; patient presents for 1 week follow-up. She has been using silver alginate to the wound bed. She still has nausea when taking her antibiotics. She denies signs of infection. 7/20; patient presents for 1 week follow-up. She has been using silver alginate with gentamicin cream to the wound bed. She denies any issues and has no complaints today. She denies signs of infection. 7/27; patient presents for 1 week follow-up. She continues to use silver alginate with gentamicin cream to the wound bed. She reports starting her antibiotics. She has no issues or complaints. Overall she reports stability to the wound. 8/3; patient presents for 1 week follow-up. She has been using silver alginate with gentamicin cream to the wound bed. She reports completing all antibiotics. She has no issues or complaints today. She denies signs of infection. 8/17;  patient presents for 2-week follow-up. He is to use silver alginate to the wound bed. She has no issues or complaints today. She denies signs of infection. She reports her pain has improved in her foot since last clinic visit 8/24; patient presents for 1 week follow-up. She continues to use silver alginate to the wound bed. She has no issues or complaints. She denies signs of infection. Pain is stable. 9/7; patient presents for follow-up. She missed her last week appointment due to feeling ill. She continues to use silver alginate. She has a new wound to the right lower extremity that is covered in eschar. She states It occurred over the past week and has no idea how it started. She currently denies signs of infection. 9/14; patient presents for follow-up. To the left foot wound she has been using gentamicin cream and silver alginate. To the right lower extremity wound she has been keeping this covered and has not obtain Santyl. 9/21;  patient presents for follow-up. She reports using gentamicin cream and silver alginate to the left foot and Santyl to the right lower extremity wound. She has no issues or complaints today. She denies signs of infection. 9/28; patient presents for follow-up. She reports a new wound to her right heel. She states this occurred a few days ago and is progressively gotten worse. She has been trying to clean the area with a Q-tip and Santyl. She reports stability in the other 2 wounds. She has been using gentamicin cream and silver alginate to the left foot and Santyl to the right lower extremity wound. 10/12; patient presents for follow-up. She reports improvement to the wound beds. She is seeing vein and vascular to discuss the potential of a left BKA. She states they are going to do an arteriogram. She continues to use silver alginate with dressing changes to her wounds. 11/2; patient presents for follow-up. She states she has not been doing dressing changes to the wound beds. She states she is not able to offload the areas. She reports chronic pain to her left foot wound. 11/9; patient presents for follow-up. She came in with only socks on. She states she forgot to put on shoes. It is unclear if she is doing any dressing changes. She currently denies systemic signs of infection. 11/16; patient presents for follow-up. She came again only with socks on. She states she does not wear shoes ever. It is unclear if she does dressing changes. She currently denies systemic signs of infection. 11/23; patient presents for follow-up. She wore her shoes today. It still unclear exactly what dressing she is using for each wound but she did states she obtained Dakin's solution and has been using this to the left foot wound. She currently denies signs of infection. NADELYN, ENRIQUES ([id number]) 11/30; patient presents for follow-up. She has no issues or complaints today. She currently denies signs of infection. 12/7;  patient presents for follow-up. She has no issues or complaints today. She has been using Hydrofera Blue to the right heel wound and Dakin solution to the left foot wound. Her right anterior leg wound is healed. She currently denies signs of infection. 12/14; patient presents for follow-up. She has been using Hydrofera Blue to the right heel and Dakin's to the left foot wounds. She has no issues or complaints today. She denies signs of infection. 12/21; patient presents for follow-up. She reports using Hydrofera Blue to the right heel and Dakin's to the left foot wound. She denies signs of infection.  12/28; patient presents for follow-up. She continues to use Dakin's to the left foot wound and Hydrofera Blue to the right heel wound. She denies signs of infection. 1/4; patient presents for follow-up. She has no issues or complaints today. She denies signs of infection. 1/11; patient presents for follow-up. It is unclear if she has been dressing these wounds over the past week. She currently denies signs of infection. 1/18; patient presents for follow-up. She states she has been using Dakin's wet-to-dry dressings to the left foot. She has been using Hydrofera Blue to the right foot foot wound. She states that the anterior right leg wound has reopened and draining serous fluid. She denies signs of infection. 1/25; patient presents for follow-up. She has no issues or complaints today. 2/1; patient presents for follow-up. She has no issues or complaints today. She denies signs of infection. 2/8; patient presents for follow-up. She has lost her surgical shoes. She did not have a dressing to the right heel wound. She currently denies signs of infection. 2/15; patient presents for follow-up. She reports more pain to the right heel today. She denies purulent drainage Or fever/chills 2/22; patient presents for follow-up. She reports taking clindamycin over the past week. She states that she continues to  have pain to her right heel. She reports purulent drainage. Readmission 03/16/2022 Ms. Daneshia Tavano is a 46 year old female with a past medical history of type 2 diabetes, osteomyelitis to her feet, chronic systolic heart failure and bipolar disorder that presents to the clinic for bilateral feet wounds and right lower extremity wound. She was last seen in our clinic on 12/15/2021. At that time she had purulent drainage coming out of her right plantar foot and I recommended she go to the ED. She states she went to [facility] and has been there for the past 3 months. I cannot see the records. She states she had OR debridement and was on several weeks of IV antibiotics while inpatient. Since discharge she has not been taking care of the wound beds. She had nothing on her feet other than socks today. She currently denies signs of infection. Patient History Information obtained from Patient. Allergies erythromycin base, Neosporin (neo-bac-polym) Social History Current every day smoker, Marital Status - Single, Alcohol Use - Never, Drug Use - Prior History, Caffeine Use - Daily. Medical History Ear/Nose/Mouth/Throat Patient has history of Chronic sinus problems/congestion, Middle ear problems Hematologic/Lymphatic Patient has history of Anemia Respiratory Patient has history of Chronic Obstructive Pulmonary Disease (COPD) Cardiovascular Patient has history of Congestive Heart Failure - EF 33% Endocrine Patient has history of Type II Diabetes Genitourinary Patient has history of End Stage Renal Disease Integumentary (Skin) Patient has history of History of pressure wounds Neurologic Patient has history of Neuropathy - feel and legs Uttech, Keven A. ([id number]) Objective Constitutional Vitals Time Taken: 12:58 PM, Temperature: 98.6 F, Pulse: 96 bpm, Respiratory Rate: 16 breaths/min, Blood Pressure: 113/85 mmHg. General Notes: Right lower extremity: To the anterior aspect  there is an open wound with granulation tissue and scant nonviable tissue. To the right plantar heel there is an incision site with increased depth throughout. No probing to bone. Left lower extremity: To the medial aspect there is an open wound with granulation tissue and nonviable tissue present. No surrounding signs of infection to any of the wound beds. Integumentary (Hair, Skin) Wound #11 status is Open. Original cause of wound was Surgical Injury. The date acquired was: 12/01/2021. The wound is located on the [place].  The wound measures 4.7cm length x 0.5cm width x 0.9cm depth; 1.846cm^2 area and 1.661cm^3 volume. There is Fat Layer (Subcutaneous Tissue) exposed. There is a medium amount of purulent drainage noted. The wound margin is distinct with the outline attached to the wound base. There is no granulation within the wound bed. There is a large (67-100%) amount of necrotic tissue within the wound bed including Adherent Slough. Wound #12 status is Open. Original cause of wound was Pressure Injury. The date acquired was: 03/16/2020. The wound is located on the [place]. The wound measures 2cm length x 3cm width x 0.2cm depth; 4.712cm^2 area and 0.942cm^3 volume. There is Fat Layer (Subcutaneous Tissue) exposed. There is a medium amount of serous drainage noted. The wound margin is flat and intact. There is small (1-33%) pink granulation within the wound bed. There is a large (67-100%) amount of necrotic tissue within the wound bed including Adherent Slough. Wound #13 status is Open. Original cause of wound was Gradually Appeared. The date acquired was: 03/15/2021. The wound is located on the Right,Medial Lower Leg. The wound measures 0.7cm length x 0.5cm width x 0.1cm depth; 0.275cm^2 area and 0.027cm^3 volume. There is Fat Layer (Subcutaneous Tissue) exposed. There is no tunneling or undermining noted. There is a medium amount of serous drainage noted. The wound  margin is flat and intact. There is large (67-100%) pink granulation within the wound bed. There is a small (1-33%) amount of necrotic tissue within the wound bed including Adherent Slough. 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 She is not doing anything to the wound beds. I debrided nonviable tissue. I recommended antibiotic ointment to the right lower extremity wound and Dakin's wet-to-dry dressings to the left medial foot wound. Since I last saw her she has had OR debridement to the right foot. I also recommended doing Dakin's wet-to-dry dressings here. I obtained a wound culture of the right foot as she may benefit from [facility] antibiotics. She reports no follow-up with her surgeon. Continue aggressive offloading with surgical shoe. Follow-up in 1 week. Procedures Wound #11 Pre-procedure diagnosis of Wound #11 is an Open Surgical Wound located on the Right,Plantar Foot . There was a Selective/Open Wound Non- Viable Tissue Debridement with a total area of 1.25 sq cm performed by Kalman Shan, MD. With the following instrument(s): Curette to remove Viable and Non-Viable tissue/material. Material removed includes [facility] after achieving pain control using Lidocaine 4% Topical Solution. No specimens were taken. A time out was conducted at 13:50, prior to the start of the procedure. A Minimum amount of bleeding was controlled with Pressure. The procedure was tolerated well with a pain level of 0 throughout and a pain level of 0  following the procedure. Post Debridement Measurements: 4.7cm length x 0.5cm width x 0.9cm depth; 1.661cm^3 volume. Character of Wound/Ulcer Post Debridement is improved. 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 There was a Selective/Open  Wound Non-Viable Tissue Debridement with a total area of 6 sq cm Habenicht, Tamesha A. ([id number]) performed by Kalman Shan, MD. With the following instrument(s): Curette to remove Viable and Non-Viable tissue/material. Material removed includes [facility] after achieving pain control using Lidocaine 4% Topical Solution. No specimens were taken. A time out was conducted at 13:50, prior to the start of the procedure. A Minimum amount of bleeding was controlled with Pressure. The procedure was tolerated well with a pain level of 0 throughout and a pain level of 0 following the procedure. Post Debridement Measurements: 2cm length x 3cm width x 0.2cm depth; 0.942cm^3 volume. Character of Wound/Ulcer Post Debridement is improved. Severity of Tissue Post Debridement is: Fat layer exposed. Post procedure Diagnosis Wound #12: Same as Pre-Procedure Plan Follow-up Appointments: Return Appointment in 1 week. Bathing/ Shower/ Hygiene: Wash wounds with antibacterial soap and water. No tub bath. Anesthetic (Use 'Patient Medications' Section for Anesthetic Order Entry): Lidocaine applied to wound bed Edema Control - Lymphedema / Segmental Compressive Device / Other: Elevate, Exercise Daily and Avoid Standing for Long Periods of Time. Elevate legs to the level of the heart and pump ankles as often as possible Elevate leg(s) parallel to the floor when sitting. Off-Loading: Other: - keep pressure off of feet. Laboratory ordered were: Wound culture routine - right  plantar The following medication(s) was prescribed: Dakin's Solution miscellaneous 0.25 % solution 1 apply to gauze for wet to dry dressings starting 03/16/2022 WOUND #11: - Foot Wound Laterality: Plantar, Right Cleanser: Soap and Water 1 x Per Day/30 Days Discharge Instructions: Gently cleanse wound with antibacterial soap, rinse and pat dry prior to dressing wounds Primary Dressing: Gauze 1 x Per Day/30 Days Discharge Instructions: As directed: moistened with Dakins Solution Secondary Dressing: ABD Pad 5x9 (in/in) 1 x Per Day/30 Days Discharge Instructions: Cover with ABD pad Secured With: Medipore Tape - 64M Medipore H Soft Cloth Surgical Tape, 2x2 (in/yd) 1 x Per Day/30 Days Secured With: [employer] Sterile or Non-Sterile 6-ply 4.5x4 (yd/yd) 1 x Per Day/30 Days Discharge Instructions: Apply Kerlix as directed WOUND #12: - Foot Wound Laterality: Plantar, Left, Medial Cleanser: Soap and Water 1 x Per Day/30 Days Discharge Instructions: Gently cleanse wound with antibacterial soap, rinse and pat dry prior to dressing wounds Primary Dressing: Gauze 1 x Per Day/30 Days Discharge Instructions: As directed: moistened with Dakins Solution Secondary Dressing: ABD Pad 5x9 (in/in) 1 x Per Day/30 Days Discharge Instructions: Cover with ABD pad Secured With: Medipore Tape - 64M Medipore H Soft Cloth Surgical Tape, 2x2 (in/yd) 1 x Per Day/30 Days Secured With: [employer] Sterile or Non-Sterile 6-ply 4.5x4 (yd/yd) 1 x Per Day/30 Days Discharge Instructions: Apply Kerlix as directed WOUND #13: - Lower Leg Wound Laterality: Right, Medial Cleanser: Soap and Water 1 x Per Day/30 Days Discharge Instructions: Gently cleanse wound with antibacterial soap, rinse and pat dry prior to dressing wounds Topical: Triple Antibiotic Ointment, 1 (oz) Tube 1 x Per Day/30 Days Primary Dressing: (BORDER) Zetuvit Plus Silicone Border Dressing 4x4 (in/in) 1 x Per Day/30 Days 1. In office sharp debridement 2. Dakin's  wet-to-dry dressing 3. Aggressive offloading -Surgical shoe with pegasus insert 4. Follow-up in 1 week 5. Wound culture Electronic Signature(s) Signed: 03/27/2022 12:17:30 AM By: Kalman Shan DO Previous Signature: 03/16/2022 3:29:15 PM Version By: Kalman Shan DO Entered By: Kalman Shan on 03/23/2022 10:56:58 [name] ([id number]) -------------------------------------------------------------------------------- ROS/PFSH Details Patient Name: Heather Dear A. Date of Service: 03/16/2022 12:45 PM Medical Record Number: [id number] Patient Account  Number: [id number] Date of Birth/Sex: [birth year] (45 y.o. Female) Treating RN: Cornell Barman Primary Care Provider: Raelene Bott Other Clinician: Referring Provider: Raelene Bott Treating Provider/Extender: Yaakov Guthrie in Treatment: 0 Information Obtained From Patient Ear/Nose/Mouth/Throat Medical History: Positive for: Chronic sinus problems/congestion; Middle ear problems Hematologic/Lymphatic Medical History: Positive for: Anemia Respiratory Medical History: Positive for: Chronic Obstructive Pulmonary Disease (COPD) Cardiovascular Medical History: Positive for: Congestive Heart Failure - EF 33% Endocrine Medical History: Positive for: Type II Diabetes Time with diabetes: 14 years Treated with: Insulin, Oral agents Blood sugar tested every day: No Blood sugar testing results: Bedtime: 176 Genitourinary Medical History: Positive for: End Stage Renal Disease Integumentary (Skin) Medical History: Positive for: History of pressure wounds Neurologic Medical History: Positive for: Neuropathy - feel and legs HBO Extended History Items Ear/Nose/Mouth/Throat: Ear/Nose/Mouth/Throat: Chronic sinus problems/congestion Middle ear problems Immunizations Pneumococcal Vaccine: Received Pneumococcal Vaccination: No Whitis, Levina A. ([id number]) Implantable Devices No devices added Family and Social  History Current every day smoker; Marital Status - Single; Alcohol Use: Never; Drug Use: Prior History; Caffeine Use: Daily Electronic Signature(s) Signed: 03/16/2022 3:29:15 PM By: Kalman Shan DO Signed: 03/17/2022 5:36:30 PM By: Gretta Cool, BSN, RN, CWS, [name] RN, BSN Signed: 03/22/2022 2:38:02 PM By: Massie Kluver Previous Signature: 03/16/2022 1:11:47 PM Version By: Gretta Cool, BSN, RN, CWS, [name] RN, BSN Entered By: Massie Kluver on 03/16/2022 13:45:53 Patchin, Schelly AMarland Kitchen ([id number]) -------------------------------------------------------------------------------- SuperBill Details Patient Name: Heather Dear A. Date of Service: 03/16/2022 Medical Record Number: [id number] Patient Account Number: [id number] Date of Birth/Sex: 1975/12/15 (46 y.o. Female) Treating RN: Cornell Barman Primary Care Provider: Raelene Bott Other Clinician: Referring Provider: Raelene Bott Treating Provider/Extender: Yaakov Guthrie in Treatment: 0 Diagnosis Coding ICD-10 Codes Code Description [id number] 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ity 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.621 Type 2 diabetes mellitus with foot ulcer E11.42 Type 2 diabetes mellitus with diabetic polyneuropathy L97.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 of skin Facility Procedures CPT4 Code: 57017793 Description: 99213 - WOUND CARE VISIT-LEV 3 EST PT Modifier: Quantity: 1 CPT4 Code: 90300923 Description: 30076 - DEBRIDE WOUND 1ST 20 SQ CM OR < Modifier: Quantity: 1 CPT4 Code: Description: ICD-10 Diagnosis Description 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 Modifier: severity Quantity: Physician Procedures CPT4 Code: 2263335 Description: 99213 - WC PHYS LEVEL 3 - EST PT Modifier: Quantity: 1 CPT4 Code: Description: ICD-10 Diagnosis Description  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 s L97.512 Non-pressure chronic ulcer of other part of right foot with fat layer expose E11.621 Type 2 diabetes mellitus with foot  ulcer L97.811 Non-pressure chronic ulcer of other part of right lower leg limited to break Modifier: everity d down of skin Quantity: CPT4 Code: 4562563 Description: 89373 - WC PHYS DEBR WO ANESTH 20 SQ CM Modifier: Quantity: 1 CPT4 Code: Description: ICD-10 Diagnosis Description 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 s Modifier: everity Quantity: Electronic Signature(s) Signed: 03/16/2022 3:29:15 PM By: Kalman Shan DO Entered By: Kalman Shan on 03/16/2022 14:49:47

## 2022-03-22 NOTE — Progress Notes (Signed)
[name], [name] ([id number]) Visit Report for 03/16/2022 Allergy List Details Patient Name: [name], [name] Date of Service: 03/16/2022 12:45 PM Medical Record Number: [id number] Patient Account Number: [id number] Date of Birth/Sex: Jun 26, 1976 (46 y.o. Female) Treating RN: Cornell Barman Primary Care Eimy Plaza: Raelene Bott Other Clinician: Referring Iyona Pehrson: Caroline More Treating Kenneith Stief/Extender: Yaakov Guthrie in Treatment: 0 Allergies Active Allergies erythromycin base Neosporin (neo-bac-polym) Allergy Notes Electronic Signature(s) Signed: 03/22/2022 2:38:02 PM By: Massie Kluver Entered By: Massie Kluver on 03/16/2022 13:45:45 Simmers, [name] Kitchen ([id number]) -------------------------------------------------------------------------------- [place] Details Patient Name: Heather Dear A. Date of Service: 03/16/2022 12:45 PM Medical Record Number: [id number] Patient Account Number: [id number] Date of Birth/Sex: 30-Aug-1976 (46 y.o. Female) Treating RN: Cornell Barman Primary Care Robyn Nohr: Raelene Bott Other Clinician: Referring Teagan Ozawa: Caroline More Treating Talah Cookston/Extender: Yaakov Guthrie in Treatment: 0 Visit Information Patient Arrived: Wheel Chair Arrival Time: 12:50 Transfer Assistance: None History Since Last Visit All ordered tests and consults were completed: No Added or deleted any medications: Yes Any new allergies or adverse reactions: No Had a fall or experienced change in activities of daily living that may affect risk of falls: No Signs or symptoms of abuse/neglect since last visito No Hospitalized since last visit: Yes Implantable device outside of the clinic excluding cellular tissue based products placed in the center since last visit: No Electronic Signature(s) Signed: 03/22/2022 2:38:02 PM By: Massie Kluver Entered By: Massie Kluver on 03/16/2022 12:55:42 Mclear, [name] Kitchen  ([id number]) -------------------------------------------------------------------------------- Clinic Level of Care Assessment Details Patient Name: Heather Dear A. Date of Service: 03/16/2022 12:45 PM Medical Record Number: [id number] Patient Account Number: [id number] Date of Birth/Sex: 09-14-1976 (45 y.o. Female) Treating RN: Cornell Barman Primary Care Melady Chow: Raelene Bott Other Clinician: Referring Kashari Chalmers: Caroline More Treating Ciria Bernardini/Extender: Yaakov Guthrie in Treatment: 0 Clinic Level of Care Assessment Items TOOL 1 Quantity Score X - Use when EandM and Procedure is performed on INITIAL visit 1 0 ASSESSMENTS - Nursing Assessment / Reassessment X - General Physical Exam (combine w/ comprehensive assessment (listed just below) when performed on new 1 20 pt. evals) X- 1 25 Comprehensive Assessment (HX, ROS, Risk Assessments, Wounds Hx, etc.) ASSESSMENTS - Wound and Skin Assessment / Reassessment []  - Dermatologic / Skin Assessment (not related to wound area) 0 ASSESSMENTS - Ostomy and/or Continence Assessment and Care []  - Incontinence Assessment and Management 0 []  - 0 Ostomy Care Assessment and Management (repouching, etc.) PROCESS - Coordination of Care X - Simple Patient / Family Education for ongoing care 1 15 []  - 0 Complex (extensive) Patient / Family Education for ongoing care X- 1 10 Staff obtains Programmer, systems, Records, Test Results / Process Orders []  - 0 Staff telephones HHA, Nursing Homes / Clarify orders / etc []  - 0 Routine Transfer to another Facility (non-emergent condition) []  - 0 Routine Hospital Admission (non-emergent condition) X- 1 15 New Admissions / Biomedical engineer / Ordering NPWT, Apligraf, etc. []  - 0 Emergency Hospital Admission (emergent condition) PROCESS - Special Needs []  - Pediatric / Minor Patient Management 0 []  - 0 Isolation Patient Management []  - 0 Hearing / Language / Visual special needs []  - 0 Assessment of  Community assistance (transportation, D/C planning, etc.) []  - 0 Additional assistance / Altered mentation []  - 0 Support Surface(s) Assessment (bed, cushion, seat, etc.) INTERVENTIONS - Miscellaneous []  - External ear exam 0 []  - 0 Patient Transfer (multiple staff / Civil Service fast streamer / Similar devices) []  - 0 Simple Staple / Suture removal (25 or less) []  -  0 Complex Staple / Suture removal (26 or more) []  - 0 Hypo/Hyperglycemic Management (do not check if billed separately) X- 1 15 Ankle / Brachial Index (ABI) - do not check if billed separately Has the patient been seen at the hospital within the last three years: Yes Total Score: 100 Level Of Care: New/Established - Level 3 Elgersma, Mayanna A. ([id number]) Electronic Signature(s) Signed: 03/22/2022 2:38:02 PM By: Massie Kluver Entered By: Massie Kluver on 03/16/2022 14:26:01 Bollard, [name] Kitchen ([id number]) -------------------------------------------------------------------------------- Encounter Discharge Information Details Patient Name: Heather Dear A. Date of Service: 03/16/2022 12:45 PM Medical Record Number: [id number] Patient Account Number: [id number] Date of Birth/Sex: 1976-03-17 (46 y.o. Female) Treating RN: Cornell Barman Primary Care Mairlyn Tegtmeyer: Raelene Bott Other Clinician: Referring Paulino Cork: Caroline More Treating Stephaney Steven/Extender: Yaakov Guthrie in Treatment: 0 Encounter Discharge Information Items Post Procedure Vitals Discharge Condition: Stable Temperature (F): 98.6 Ambulatory Status: Wheelchair Pulse (bpm): 96 Discharge Destination: Home Respiratory Rate (breaths/min): 16 Transportation: Private Auto Blood Pressure (mmHg): 113/85 Accompanied By: Mother Schedule Follow-up Appointment: Yes Clinical Summary of Care: Electronic Signature(s) Signed: 03/22/2022 2:38:02 PM By: Massie Kluver Entered By: Massie Kluver on 03/16/2022 14:31:19 Buzzelli, Jeffifer A.  ([id number]) -------------------------------------------------------------------------------- Lower Extremity Assessment Details Patient Name: Heather Dear A. Date of Service: 03/16/2022 12:45 PM Medical Record Number: [id number] Patient Account Number: [id number] Date of Birth/Sex: 10-Dec-1975 (46 y.o. Female) Treating RN: Cornell Barman Primary Care Vallen Calabrese: Raelene Bott Other Clinician: Referring Keian Odriscoll: Caroline More Treating Alyviah Crandle/Extender: Yaakov Guthrie in Treatment: 0 Edema Assessment Assessed: [Left: Yes] [Right: Yes] Edema: [Left: Yes] [Right: No] Calf Left: Right: Point of Measurement: 31 cm From Medial Instep 31.5 cm 31.5 cm Ankle Left: Right: Point of Measurement: 9 cm From Medial Instep 20 cm 19.8 cm Vascular Assessment Pulses: Dorsalis Pedis Palpable: [Left:Yes] [Right:Yes] Doppler Audible: [Left:Yes] [Right:Yes] Posterior Tibial Palpable: [Left:No] [Right:Yes] Doppler Audible: [Left:Yes] [Right:Yes] Blood Pressure: Brachial: [Right:118] Dorsalis Pedis: 130 [Left:Dorsalis Pedis: 800] Ankle: Posterior Tibial: 132 [Left:Posterior Tibial: 128 1.12] [Right:1.17] Electronic Signature(s) Signed: 03/17/2022 5:36:30 PM By: Gretta Cool, BSN, RN, CWS, [name] RN, BSN Signed: 03/22/2022 2:38:02 PM By: Massie Kluver Previous Signature: 03/16/2022 1:11:47 PM Version By: Gretta Cool, BSN, RN, CWS, [name] RN, BSN Entered By: Massie Kluver on 03/16/2022 13:45:34 Naeve, Noelly A. ([id number]) -------------------------------------------------------------------------------- Multi Wound Chart Details Patient Name: Heather Dear A. Date of Service: 03/16/2022 12:45 PM Medical Record Number: [id number] Patient Account Number: [id number] Date of Birth/Sex: [birth year] (46 y.o. 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 Comorbid History: Chronic sinus problems/congestion, Chronic sinus problems/congestion, Chronic sinus problems/congestion, Middle ear problems, Anemia, Middle ear problems, Anemia, Middle ear problems, Anemia, Chronic Obstructive Pulmonary Chronic Obstructive Pulmonary Chronic Obstructive Pulmonary Disease (COPD), Congestive Heart Disease (COPD), Congestive Heart Disease (COPD), Congestive Heart Failure, Type II Diabetes, End Stage Failure, Type II Diabetes, End Stage Failure, Type II Diabetes, End Stage Renal Disease, History of pressure Renal Disease, History of pressure Renal Disease, History of pressure wounds, Neuropathy wounds, Neuropathy wounds, Neuropathy Date Acquired: 12/01/2021 03/16/2020 03/15/2021 Weeks of Treatment: 0 0 0 Wound Status: Open Open Open Wound Recurrence: No No No Measurements  L x W x D (cm) 4.7x0.5x0.9 2x3x0.2 0.7x0.5x0.1 Area (cm) : 1.846 4.712 0.275 Volume (cm) : 1.661 0.942 0.027 % Reduction in Area: 0.00% N/A N/A % Reduction in Volume: 0.00% N/A N/A Classification: Full Thickness Without Exposed Grade 3 Grade 1 Support Structures Wagner Verification: N/A MRI N/A Exudate Amount: Medium Medium Medium Exudate Type: Purulent Serous Serous Exudate Color: yellow, brown, green amber amber Wound Margin: Distinct, outline attached Flat and Intact Flat and Intact Granulation Amount: None Present (0%) Small (1-33%) Large (67-100%) Granulation Quality: N/A Pink Pink Necrotic Amount: Large (67-100%) Large (67-100%) Small  (1-33%) Exposed Structures: Fat Layer (Subcutaneous Tissue): Fat Layer (Subcutaneous Tissue): Fat Layer (Subcutaneous Tissue): Yes Yes Yes Fascia: No Fascia: No Fascia: No Tendon: No Tendon: No Tendon: No Muscle: No Muscle: No Muscle: No Joint: No Joint: No Joint: No Bone: No Bone: No Bone: No Debridement: Debridement - Selective/Open Debridement - Selective/Open N/A Wound Wound Pre-procedure Verification/Time 13:50 13:50 N/A Out Taken: Pain Control: Lidocaine 4% Topical Solution Lidocaine 4% Topical Solution N/A Tissue Debrided: [address] N/A BARBAR, BREDE A. ([id number]) Level: Non-Viable Tissue Non-Viable Tissue N/A Debridement Area (sq cm): 1.25 6 N/A Instrument: Curette Curette N/A Bleeding: Minimum Minimum N/A Hemostasis Achieved: Pressure Pressure N/A Procedural Pain: 0 0 N/A Post Procedural Pain: 0 0 N/A Debridement Treatment Procedure was tolerated well Procedure was tolerated well N/A Response: Post Debridement 4.7x0.5x0.9 2x3x0.2 N/A Measurements L x W x D (cm) Post Debridement Volume: 1.661 0.942 N/A (cm) Procedures Performed: Debridement Debridement N/A Treatment Notes Wound #11 (Foot) Wound Laterality: Plantar, Right Cleanser Soap and Water Discharge Instruction: Gently cleanse wound with antibacterial soap, rinse and pat dry prior to dressing wounds Peri-Wound Care Topical Primary Dressing Gauze Discharge Instruction: As directed: moistened with Dakins Solution Secondary Dressing ABD Pad 5x9 (in/in) Discharge Instruction: Cover with ABD pad Secured With Medipore Tape - 44M Medipore H Soft Cloth Surgical Tape, 2x2 (in/yd) Kerlix Roll Sterile or Non-Sterile 6-ply 4.5x4 (yd/yd) Discharge Instruction: Apply Kerlix as directed Compression Wrap Compression Stockings Add-Ons Wound #12 (Foot) Wound Laterality: Plantar, Left, Medial Cleanser Soap and Water Discharge Instruction: Gently cleanse wound with antibacterial soap, rinse and pat dry  prior to dressing wounds Peri-Wound Care Topical Primary Dressing Gauze Discharge Instruction: As directed: moistened with Dakins Solution Secondary Dressing ABD Pad 5x9 (in/in) Discharge Instruction: Cover with ABD pad Secured With Medipore Tape - 44M Medipore H Soft Cloth Surgical Tape, 2x2 (in/yd) Kerlix Roll Sterile or Non-Sterile 6-ply 4.5x4 (yd/yd) Senna, Kitara [name] Kitchen ([id number]) Discharge Instruction: Apply Kerlix as directed Compression Wrap Compression Stockings Add-Ons Wound #13 (Lower Leg) Wound Laterality: Right, Medial Cleanser Soap and Water Discharge Instruction: Gently cleanse wound with antibacterial soap, rinse and pat dry prior to dressing wounds Peri-Wound Care Topical Triple Antibiotic Ointment, 1 (oz) Tube Primary Dressing (BORDER) Zetuvit Plus Silicone Border Dressing 4x4 (in/in) Secondary Dressing Secured With Compression Wrap Compression Stockings Add-Ons Electronic Signature(s) Signed: 03/16/2022 3:29:15 PM By: Kalman Shan DO Entered By: Kalman Shan on 03/16/2022 14:42:31 [name] ([id number]) -------------------------------------------------------------------------------- [place] Details Patient Name: Heather Dear A. Date of Service: 03/16/2022 12:45 PM Medical Record Number: [id number] Patient Account Number: [id number] Date of Birth/Sex: 1975-10-31 (46 y.o. Female) Treating RN: Cornell Barman Primary Care Shatara Stanek: Raelene Bott Other Clinician: Referring Raygan Skarda: Caroline More Treating Vinicius Brockman/Extender: Yaakov Guthrie in Treatment: 0 Active Inactive Abuse / Safety / Falls / Self Care Management Nursing Diagnoses: History of Falls Potential for falls Potential for injury related to falls Goals: Patient will not develop  complications from immobility Date Initiated: 03/17/2022 Target Resolution Date: 03/16/2022 Goal Status: Active Patient/caregiver will verbalize understanding of skin care  regimen Date Initiated: 03/17/2022 Target Resolution Date: 03/16/2022 Goal Status: Active Patient/caregiver will verbalize/demonstrate measure taken to improve self care Date Initiated: 03/17/2022 Target Resolution Date: 03/16/2022 Goal Status: Active Interventions: Assess fall risk on admission and as needed Provide education on basic hygiene Provide education on personal and home safety Notes: Necrotic Tissue Nursing Diagnoses: Impaired tissue integrity related to necrotic/devitalized tissue Knowledge deficit related to management of necrotic/devitalized tissue Goals: Necrotic/devitalized tissue will be minimized in the wound bed Date Initiated: 03/17/2022 Target Resolution Date: 03/16/2022 Goal Status: Active Patient/caregiver will verbalize understanding of reason and process for debridement of necrotic tissue Date Initiated: 03/17/2022 Target Resolution Date: 03/16/2022 Goal Status: Active Interventions: Provide education on necrotic tissue and debridement process Treatment Activities: Apply topical anesthetic as ordered : 03/16/2022 Excisional debridement : 03/16/2022 Notes: Orientation to the Wound Care Program Nursing Diagnoses: Knowledge deficit related to the wound healing center program REBAKAH, COKLEY A. ([id number]) Goals: Patient/caregiver will verbalize understanding of the [place] Date Initiated: 03/17/2022 Target Resolution Date: 03/16/2022 Goal Status: Active Interventions: Provide education on orientation to the wound center Notes: Osteomyelitis Nursing Diagnoses: Infection: osteomyelitis Knowledge deficit related to disease process and management Goals: Patient/caregiver will verbalize understanding of disease process and disease management Date Initiated: 03/17/2022 Target Resolution Date: 03/16/2022 Goal Status: Active Patient's osteomyelitis will resolve Date Initiated: 03/17/2022 Target Resolution Date: 03/16/2022 Goal Status:  Active Signs and symptoms for osteomyelitis will be recognized and promptly addressed Date Initiated: 03/17/2022 Target Resolution Date: 03/16/2022 Goal Status: Active Interventions: Assess for signs and symptoms of osteomyelitis resolution every visit Provide education on osteomyelitis Treatment Activities: Systemic antibiotics : 03/16/2022 Notes: Wound/Skin Impairment Nursing Diagnoses: Impaired tissue integrity Knowledge deficit related to smoking impact on wound healing Knowledge deficit related to ulceration/compromised skin integrity Goals: Patient/caregiver will verbalize understanding of skin care regimen Date Initiated: 03/17/2022 Target Resolution Date: 03/16/2022 Goal Status: Active Ulcer/skin breakdown will have a volume reduction of 30% by week 4 Date Initiated: 03/17/2022 Target Resolution Date: 04/13/2022 Goal Status: Active Ulcer/skin breakdown will have a volume reduction of 50% by week 8 Date Initiated: 03/17/2022 Target Resolution Date: 05/04/2022 Goal Status: Active Ulcer/skin breakdown will have a volume reduction of 80% by week 12 Date Initiated: 03/17/2022 Target Resolution Date: 06/01/2022 Goal Status: Active Ulcer/skin breakdown will heal within 14 weeks Date Initiated: 03/17/2022 Target Resolution Date: 06/15/2022 Goal Status: Active Interventions: Assess ulceration(s) every visit Provide education on ulcer and skin care Treatment Activities: Skin care regimen initiated : 03/16/2022 [name], [name] ([id number]) Notes: Electronic Signature(s) Signed: 03/17/2022 8:28:23 AM By: Alycia Rossetti Signed: 03/17/2022 5:36:30 PM By: Gretta Cool, BSN, RN, CWS, [name] RN, BSN Entered By: Alycia Rossetti on 03/17/2022 19:37:90 [name] ([id number]) -------------------------------------------------------------------------------- Pain Assessment Details Patient Name: Heather Dear A. Date of Service: 03/16/2022 12:45 PM Medical Record Number: [id number] Patient Account  Number: [id number] Date of Birth/Sex: 1976/08/09 (46 y.o. Female) Treating RN: Cornell Barman Primary Care Earnstine Meinders: Raelene Bott Other Clinician: Referring Algenis Ballin: Caroline More Treating Yochanan Eddleman/Extender: Yaakov Guthrie in Treatment: 0 Active Problems Location of Pain Severity and Description of Pain Patient Has Paino Yes Site Locations Pain Location: Pain in Ulcers Duration of the Pain. Constant / Intermittento Constant Rate the pain. Current Pain Level: 10 Worst Pain Level: 10 Least Pain Level: 8 Character of Pain Describe the Pain: Throbbing Pain Management and Medication Current Pain Management: Medication: Yes Cold Application:  No Rest: Yes Massage: No Activity: No T.E.N.S.: No Heat Application: No Leg drop or elevation: No Is the Current Pain Management Adequate: Inadequate How does your wound impact your activities of daily livingo Sleep: Yes Bathing: No Appetite: No Relationship With Others: No Bladder Continence: No Emotions: No Bowel Continence: No Work: No Toileting: No Drive: No Dressing: No Hobbies: No Engineer, maintenance) Signed: 03/16/2022 1:11:47 PM By: Gretta Cool, BSN, RN, CWS, [name] RN, BSN Signed: 03/22/2022 2:38:02 PM By: Massie Kluver Entered By: Massie Kluver on 03/16/2022 12:57:59 [name] ([id number]) -------------------------------------------------------------------------------- Patient/Caregiver Education Details Patient Name: Heather Dear A. Date of Service: 03/16/2022 12:45 PM Medical Record Number: [id number] Patient Account Number: [id number] Date of Birth/Gender: 21-Mar-1976 (46 y.o. Female) Treating RN: Cornell Barman Primary Care Physician: Raelene Bott Other Clinician: Referring Physician: Caroline More Treating Physician/Extender: Yaakov Guthrie in Treatment: 0 Education Assessment Education Provided To: Patient Education Topics Provided Welcome To [facility]: Methods:  Explain/Verbal Responses: State content correctly Wound Debridement: Electronic Signature(s) Signed: 03/22/2022 2:38:02 PM By: Massie Kluver Entered By: Massie Kluver on 03/16/2022 14:28:22 [place], [name] Kitchen ([id number]) -------------------------------------------------------------------------------- Wound Assessment Details Patient Name: Heather Dear A. Date of Service: 03/16/2022 12:45 PM Medical Record Number: [id number] Patient Account Number: [id number] Date of Birth/Sex: 1976/10/17 (46 y.o. Female) Treating RN: Cornell Barman Primary Care Marine Lezotte: Raelene Bott Other Clinician: Referring Graiden Henes: Caroline More Treating Crystol Walpole/Extender: Yaakov Guthrie in Treatment: 0 Wound Status Wound Number: 11 Primary Open Surgical Wound Etiology: Wound Location: Right, Plantar Foot Wound Open Wounding Event: Surgical Injury Status: Date Acquired: 12/01/2021 Comorbid Chronic sinus problems/congestion, Middle ear problems, Weeks Of Treatment: 0 History: Anemia, Chronic Obstructive Pulmonary Disease (COPD), Clustered Wound: No Congestive Heart Failure, Type II Diabetes, End Stage Renal Disease, History of pressure wounds, Neuropathy Photos Photo Uploaded By: Gretta Cool, BSN, RN, CWS, [name] on 03/16/2022 13:53:59 Wound Measurements Length: (cm) 4.7 Width: (cm) 0.5 Depth: (cm) 0.9 Area: (cm) 1.846 Volume: (cm) 1.661 % Reduction in Area: 0% % Reduction in Volume: 0% Wound Description Classification: Full Thickness Without Exposed Support Structu Wound Margin: Distinct, outline attached Exudate Amount: Medium Exudate Type: Purulent Exudate Color: yellow, brown, green res Foul Odor After Cleansing: No Slough/Fibrino Yes Wound Bed Granulation Amount: None Present (0%) Exposed Structure Necrotic Amount: Large (67-100%) Fascia Exposed: No Necrotic Quality: Adherent Slough Fat Layer (Subcutaneous Tissue) Exposed: Yes Tendon Exposed: No Muscle Exposed: No Joint Exposed: No Bone  Exposed: No Treatment Notes Wound #11 (Foot) Wound Laterality: Plantar, Right Cleanser Soap and Water Weekes, [name] A. ([id number]) Discharge Instruction: Gently cleanse wound with antibacterial soap, rinse and pat dry prior to dressing wounds Peri-Wound Care Topical Primary Dressing Gauze Discharge Instruction: As directed: moistened with Dakins Solution Secondary Dressing ABD Pad 5x9 (in/in) Discharge Instruction: Cover with ABD pad Secured With Medipore Tape - 51M Medipore H Soft Cloth Surgical Tape, 2x2 (in/yd) Kerlix Roll Sterile or Non-Sterile 6-ply 4.5x4 (yd/yd) Discharge Instruction: Apply Kerlix as directed Compression Wrap Compression Stockings Add-Ons Electronic Signature(s) Signed: 03/17/2022 5:36:30 PM By: Gretta Cool, BSN, RN, CWS, [name] RN, BSN Signed: 03/22/2022 2:38:02 PM By: Massie Kluver Entered By: Massie Kluver on 03/16/2022 13:41:30 Ryther, Surah A. ([id number]) -------------------------------------------------------------------------------- Wound Assessment Details Patient Name: Heather Dear A. Date of Service: 03/16/2022 12:45 PM Medical Record Number: [id number] Patient Account Number: [id number] Date of Birth/Sex: [birth year] (46 y.o. Female) Treating RN: Cornell Barman Primary Care Korbin Notaro: Raelene Bott Other Clinician: Referring Jakell Trusty: Caroline More Treating Michah Minton/Extender: Yaakov Guthrie in Treatment: 0 Wound Status Wound Number: 12 Primary Diabetic  Wound/Ulcer of the Lower Extremity Etiology: Wound Location: Left, Medial, Plantar Foot Wound Open Wounding Event: Pressure Injury Status: Date Acquired: 03/16/2020 Comorbid Chronic sinus problems/congestion, Middle ear problems, Weeks Of Treatment: 0 History: Anemia, Chronic Obstructive Pulmonary Disease (COPD), Clustered Wound: No Congestive Heart Failure, Type II Diabetes, End Stage Renal Disease, History of pressure wounds, Neuropathy Photos Photo Uploaded By: Gretta Cool, BSN, RN, CWS, [name]  on 03/16/2022 13:53:59 Wound Measurements Length: (cm) Width: (cm) Depth: (cm) Area: (cm) Volume: (cm) 2 % Reduction in Area: 3 % Reduction in Volume: 0.2 4.712 0.942 Wound Description Classification: Grade 3 Wagner Verification: MRI Wound Margin: Flat and Intact Exudate Amount: Medium Exudate Type: Serous Exudate Color: amber Foul Odor After Cleansing: No Slough/Fibrino Yes Wound Bed Granulation Amount: Small (1-33%) Exposed Structure Granulation Quality: Pink Fascia Exposed: No Necrotic Amount: Large (67-100%) Fat Layer (Subcutaneous Tissue) Exposed: Yes Necrotic Quality: Adherent Slough Tendon Exposed: No Muscle Exposed: No Joint Exposed: No Bone Exposed: No Treatment Notes Wound #12 (Foot) Wound Laterality: Plantar, Left, Medial Cleanser Soap and Water Stahle, Kedra A. ([id number]) Discharge Instruction: Gently cleanse wound with antibacterial soap, rinse and pat dry prior to dressing wounds Peri-Wound Care Topical Primary Dressing Gauze Discharge Instruction: As directed: moistened with Dakins Solution Secondary Dressing ABD Pad 5x9 (in/in) Discharge Instruction: Cover with ABD pad Secured With Medipore Tape - 40M Medipore H Soft Cloth Surgical Tape, 2x2 (in/yd) Kerlix Roll Sterile or Non-Sterile 6-ply 4.5x4 (yd/yd) Discharge Instruction: Apply Kerlix as directed Compression Wrap Compression Stockings Add-Ons Electronic Signature(s) Signed: 03/17/2022 5:36:30 PM By: Gretta Cool, BSN, RN, CWS, [name] RN, BSN Signed: 03/22/2022 2:38:02 PM By: Massie Kluver Entered By: Massie Kluver on 03/16/2022 13:39:08 Authement, Ceciley A. ([id number]) -------------------------------------------------------------------------------- Wound Assessment Details Patient Name: Heather Dear A. Date of Service: 03/16/2022 12:45 PM Medical Record Number: [id number] Patient Account Number: [id number] Date of Birth/Sex: 04-25-1976 (46 y.o. Female) Treating RN: Cornell Barman Primary Care  Jeanpaul Biehl: Raelene Bott Other Clinician: Referring Anber Mckiver: Caroline More Treating Teagen Mcleary/Extender: Yaakov Guthrie in Treatment: 0 Wound Status Wound Number: 13 Primary Diabetic Wound/Ulcer of the Lower Extremity Etiology: Wound Location: Right, Medial Lower Leg Wound Open Wounding Event: Gradually Appeared Status: Date Acquired: 03/15/2021 Comorbid Chronic sinus problems/congestion, Middle ear problems, Weeks Of Treatment: 0 History: Anemia, Chronic Obstructive Pulmonary Disease (COPD), Clustered Wound: No Congestive Heart Failure, Type II Diabetes, End Stage Renal Disease, History of pressure wounds, Neuropathy Photos Photo Uploaded By: Gretta Cool, BSN, RN, CWS, [name] on 03/16/2022 13:54:18 Wound Measurements Length: (cm) 0.7 Width: (cm) 0.5 Depth: (cm) 0.1 Area: (cm) 0.275 Volume: (cm) 0.027 % Reduction in Area: % Reduction in Volume: Tunneling: No Undermining: No Wound Description Classification: Grade 1 Wound Margin: Flat and Intact Exudate Amount: Medium Exudate Type: Serous Exudate Color: amber Foul Odor After Cleansing: No Slough/Fibrino Yes Wound Bed Granulation Amount: Large (67-100%) Exposed Structure Granulation Quality: Pink Fascia Exposed: No Necrotic Amount: Small (1-33%) Fat Layer (Subcutaneous Tissue) Exposed: Yes Necrotic Quality: Adherent Slough Tendon Exposed: No Muscle Exposed: No Joint Exposed: No Bone Exposed: No Treatment Notes Wound #13 (Lower Leg) Wound Laterality: Right, Medial Cleanser Soap and Water Mcwilliams, Lawsyn A. ([id number]) Discharge Instruction: Gently cleanse wound with antibacterial soap, rinse and pat dry prior to dressing wounds Peri-Wound Care Topical Triple Antibiotic Ointment, 1 (oz) Tube Primary Dressing (BORDER) Zetuvit Plus Silicone Border Dressing 4x4 (in/in) Secondary Dressing Secured With Compression Wrap Compression Stockings Add-Ons Electronic Signature(s) Signed: 03/17/2022 5:36:30 PM By: Gretta Cool,  BSN, RN, CWS, [name] RN, BSN Signed: 03/22/2022 2:38:02 PM By: Massie Kluver  Entered By: Massie Kluver on 03/16/2022 13:44:47 [name] ([id number]) -------------------------------------------------------------------------------- Vitals Details Patient Name: Heather Dear A. Date of Service: 03/16/2022 12:45 PM Medical Record Number: [id number] Patient Account Number: [id number] Date of Birth/Sex: 1976/04/05 (46 y.o. Female) Treating RN: Cornell Barman Primary Care Deshia Vanderhoof: Raelene Bott Other Clinician: Referring Londyn Wotton: Caroline More Treating Dequavius Kuhner/Extender: Yaakov Guthrie in Treatment: 0 Vital Signs Time Taken: 12:58 Temperature (F): 98.6 Pulse (bpm): 96 Respiratory Rate (breaths/min): 16 Blood Pressure (mmHg): 113/85 Reference Range: 80 - 120 mg / dl Electronic Signature(s) Signed: 03/22/2022 2:38:02 PM By: Massie Kluver Entered By: Massie Kluver on 03/16/2022 12:59:45

## 2022-03-22 NOTE — Progress Notes (Signed)
[name], [name] ([id number]) Visit Report for 03/16/2022 Abuse Risk Screen Details Patient Name: [name], [name] Date of Service: 03/16/2022 12:45 PM Medical Record Number: [id number] Patient Account Number: [id number] Date of Birth/Sex: 23-Jun-1976 (46 y.o. Female) Treating RN: Cornell Barman Primary Care Layson Bertsch: Raelene Bott Other Clinician: Referring Caston Coopersmith: Caroline More Treating Sheenah Dimitroff/Extender: Yaakov Guthrie in Treatment: 0 Abuse Risk Screen Items Answer ABUSE RISK SCREEN: Has anyone close to you tried to hurt or harm you recentlyo No Do you feel uncomfortable with anyone in your familyo No Has anyone forced you do things that you didnot want to doo No Electronic Signature(s) Signed: 03/17/2022 5:36:30 PM By: Gretta Cool, BSN, RN, CWS, [name] RN, BSN Signed: 03/22/2022 2:38:02 PM By: Massie Kluver Entered By: Massie Kluver on 03/16/2022 13:45:57 [name], [name] ([id number]) -------------------------------------------------------------------------------- Activities of Daily Living Details Patient Name: Heather Dear A. Date of Service: 03/16/2022 12:45 PM Medical Record Number: [id number] Patient Account Number: [id number] Date of Birth/Sex: [birth year] (46 y.o. Female) Treating RN: Cornell Barman Primary Care Miran Kautzman: Raelene Bott Other Clinician: Referring Soua Lenk: Caroline More Treating Scotty Weigelt/Extender: Yaakov Guthrie in Treatment: 0 Activities of Daily Living Items Answer Activities of Daily Living (Please select one for each item) Drive Automobile Not Able Take Medications Completely Able Use Telephone Completely Able Care for Appearance Need Assistance Use Toilet Completely Able Bath / Shower Need Assistance Dress Self Completely Able Feed Self Completely Able Walk Need Assistance Get In / Out Bed Need Assistance Housework Not Able Prepare Meals Need Assistance Handle Money Completely Able Shop for Self Completely Able Electronic  Signature(s) Signed: 03/17/2022 5:36:30 PM By: Gretta Cool, BSN, RN, CWS, [name] RN, BSN Signed: 03/22/2022 2:38:02 PM By: Massie Kluver Entered By: Massie Kluver on 03/16/2022 13:46:02 [name], [name] Kitchen ([id number]) -------------------------------------------------------------------------------- Education Screening Details Patient Name: Heather Dear A. Date of Service: 03/16/2022 12:45 PM Medical Record Number: [id number] Patient Account Number: [id number] Date of Birth/Sex: [birth year] (46 y.o. Female) Treating RN: Cornell Barman Primary Care Alekzander Cardell: Raelene Bott Other Clinician: Referring Lindsy Cerullo: Caroline More Treating Tarren Velardi/Extender: Yaakov Guthrie in Treatment: 0 Primary Learner Assessed: Patient Learning Preferences/Education Level/Primary Language Learning Preference: Explanation Highest Education Level: High School Preferred Language: English Cognitive Barrier Language Barrier: No Translator Needed: No Memory Deficit: No Emotional Barrier: No Cultural/Religious Beliefs Affecting Medical Care: No Physical Barrier Impaired Vision: Yes Glasses Impaired Hearing: No Decreased Hand dexterity: No Knowledge/Comprehension Knowledge Level: Medium Comprehension Level: Medium Ability to understand written instructions: Medium Ability to understand verbal instructions: Medium Motivation Cooperation: Cooperative Education Importance: Acknowledges Need Interest in Health Problems: Asks Questions Perception: Coherent Willingness to Engage in Self-Management High Activities: Readiness to Engage in Self-Management High Activities: Electronic Signature(s) Signed: 03/17/2022 5:36:30 PM By: Gretta Cool, BSN, RN, CWS, [name] RN, BSN Signed: 03/22/2022 2:38:02 PM By: Massie Kluver Entered By: Massie Kluver on 03/16/2022 13:46:06 [name] ([id number]) -------------------------------------------------------------------------------- Fall Risk Assessment Details Patient Name:  Heather Dear A. Date of Service: 03/16/2022 12:45 PM Medical Record Number: [id number] Patient Account Number: [id number] Date of Birth/Sex: Oct 13, 1976 (46 y.o. Female) Treating RN: Cornell Barman Primary Care Nikkita Adeyemi: Raelene Bott Other Clinician: Referring Tenea Sens: Caroline More Treating Eathel Pajak/Extender: Yaakov Guthrie in Treatment: 0 Fall Risk Assessment Items Have you had 2 or more falls in the last 12 monthso 0 Yes Have you had any fall that resulted in injury in the last 12 monthso 0 No FALLS RISK SCREEN History of falling - immediate or within 3 months 25 Yes Secondary diagnosis (Do you have 2 or more medical diagnoseso) 15  Yes Ambulatory aid None/bed rest/wheelchair/nurse 0 Yes Crutches/cane/walker 0 No Furniture 0 No Intravenous therapy Access/Saline/Heparin Lock 0 No Gait/Transferring Normal/ bed rest/ wheelchair 0 Yes Weak (short steps with or without shuffle, stooped but able to lift head while walking, may 0 No seek support from furniture) Impaired (short steps with shuffle, may have difficulty arising from chair, head down, impaired 0 No balance) Mental Status Oriented to own ability 0 Yes Electronic Signature(s) Signed: 03/17/2022 5:36:30 PM By: Gretta Cool, BSN, RN, CWS, [name] RN, BSN Signed: 03/22/2022 2:38:02 PM By: Massie Kluver Entered By: Massie Kluver on 03/16/2022 13:46:09 [name], [name] ([id number]) -------------------------------------------------------------------------------- Foot Assessment Details Patient Name: Heather Dear A. Date of Service: 03/16/2022 12:45 PM Medical Record Number: [id number] Patient Account Number: [id number] Date of Birth/Sex: [birth year] (46 y.o. Female) Treating RN: Cornell Barman Primary Care Damontae Loppnow: Raelene Bott Other Clinician: Referring Modell Fendrick: Caroline More Treating Vontae Court/Extender: Yaakov Guthrie in Treatment: 0 Foot Assessment Items Site Locations + = Sensation present, - = Sensation absent, C =  Callus, U = Ulcer R = Redness, W = Warmth, M = Maceration, PU = Pre-ulcerative lesion F = Fissure, S = Swelling, D = Dryness Assessment Right: Left: Other Deformity: No No Prior Foot Ulcer: Yes Yes Prior Amputation: Yes Yes Charcot Joint: No No Ambulatory Status: Ambulatory With Help Assistance Device: Wheelchair Gait: Administrator, arts) Signed: 03/17/2022 5:36:30 PM By: Gretta Cool, BSN, RN, CWS, [name] RN, BSN Signed: 03/22/2022 2:38:02 PM By: Massie Kluver Entered By: Massie Kluver on 03/16/2022 13:31:25 [name], [name] ([id number]) -------------------------------------------------------------------------------- Nutrition Risk Screening Details Patient Name: Heather Dear A. Date of Service: 03/16/2022 12:45 PM Medical Record Number: [id number] Patient Account Number: [id number] Date of Birth/Sex: [birth year] (46 y.o. Female) Treating RN: Cornell Barman Primary Care Loany Neuroth: Raelene Bott Other Clinician: Referring Anyjah Roundtree: Caroline More Treating Ekansh Sherk/Extender: Yaakov Guthrie in Treatment: 0 Height (in): Weight (lbs): Body Mass Index (BMI): Nutrition Risk Screening Items Score Screening NUTRITION RISK SCREEN: I have an illness or condition that made me change the kind and/or amount of food I eat 0 No I eat fewer than two meals per day 3 Yes I eat few fruits and vegetables, or milk products 2 Yes I have three or more drinks of beer, liquor or wine almost every day 0 No I have tooth or mouth problems that make it hard for me to eat 0 No I don't always have enough money to buy the food I need 0 No I eat alone most of the time 0 No I take three or more different prescribed or over-the-counter drugs a day 1 Yes Without wanting to, I have lost or gained 10 pounds in the last six months 0 No I am not always physically able to shop, cook and/or feed myself 0 No Nutrition Protocols Good Risk Protocol Moderate Risk Protocol Provide education on elevated High  Risk Proctocol 0 blood sugars and impact on wound healing, as applicable Risk Level: High Risk Score: 6 Electronic Signature(s) Signed: 03/17/2022 5:36:30 PM By: Gretta Cool, BSN, RN, CWS, [name] RN, BSN Signed: 03/22/2022 2:38:02 PM By: Massie Kluver Entered By: Massie Kluver on 03/16/2022 13:30:05

## 2022-03-23 ENCOUNTER — Encounter (HOSPITAL_BASED_OUTPATIENT_CLINIC_OR_DEPARTMENT_OTHER): Payer: Medicaid Other | Admitting: Internal Medicine

## 2022-03-23 DIAGNOSIS — E11621 Type 2 diabetes mellitus with foot ulcer: Secondary | ICD-10-CM

## 2022-03-23 DIAGNOSIS — L97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ity: Secondary | ICD-10-CM | POA: Diagnosis not present

## 2022-03-23 DIAGNOSIS — L97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 of skin: Secondary | ICD-10-CM

## 2022-03-23 DIAGNOSIS — L97512 Non-pressure chronic ulcer of other part of right foot with fat layer exposed: Secondary | ICD-10-CM

## 2022-03-24 NOTE — Progress Notes (Signed)
Heather, [name] ([id number]) Visit Report for 03/23/2022 Chief Complaint Document Details Patient Name: Heather, [name] Date of Service: 03/23/2022 9:00 AM Medical Record Number: [id number] Patient Account Number: [id number] Date of Birth/Sex: 28-Jun-1976 (46 y.o. F) Treating RN: Cornell Barman Primary Care Provider: Raelene Bott Other Clinician: Massie Kluver Referring Provider: Raelene Bott Treating Provider/Extender: Yaakov Guthrie in Treatment: 1 Information Obtained from: Patient Chief Complaint 03/19/2021; patient referred by Dr. Luana Shu who has been looking after her left foot for quite a period of time for review of a nonhealing area in the left midfoot 03/12/2022; bilateral feet wounds and right lower extremity wound. Electronic Signature(s) Signed: 03/23/2022 11:38:35 AM By: Kalman Shan DO Entered By: Kalman Shan on 03/23/2022 10:47:40 [name] ([id number]) -------------------------------------------------------------------------------- Debridement Details Patient Name: Heather Dear A. Date of Service: 03/23/2022 9:00 AM Medical Record Number: [id number] Patient Account Number: [id number] Date of Birth/Sex: June 15, 1976 (46 y.o. F) Treating RN: Cornell Barman Primary Care Provider: Raelene Bott Other Clinician: Massie Kluver Referring Provider: Raelene Bott Treating Provider/Extender: Yaakov Guthrie in Treatment: 1 Debridement Performed for Wound #11 Right,Plantar Foot Assessment: Performed By: Physician Kalman Shan, MD Debridement Type: Debridement Level of Consciousness (Pre- Awake and Alert procedure): Pre-procedure Verification/Time Out Yes - 10:10 Taken: Start Time: 10:10 Pain Control: Lidocaine Total Area Debrided (L x W): 4.5 (cm) x 1.5 (cm) = 6.75 (cm) Tissue and other material Viable, Non-Viable, Slough, Subcutaneous, Slough debrided: Level: Skin/Subcutaneous Tissue Debridement Description: Excisional Instrument:  Curette Bleeding: Minimum Hemostasis Achieved: Pressure End Time: 10:15 Response to Treatment: Procedure was tolerated well Level of Consciousness (Post- Awake and Alert procedure): Post Debridement Measurements of Total Wound Length: (cm) 4.5 Width: (cm) 1.5 Depth: (cm) 0.9 Volume: (cm) 4.771 Character of Wound/Ulcer Post Debridement: Stable Post Procedure Diagnosis Same as Pre-procedure Electronic Signature(s) Signed: 03/23/2022 11:38:35 AM By: Kalman Shan DO Signed: 03/23/2022 4:31:03 PM By: Massie Kluver Signed: 03/24/2022 9:49:48 AM By: Gretta Cool, BSN, RN, CWS, [name] RN, BSN Entered By: Massie Kluver on 03/23/2022 10:18:29 Heather, Caitlan A. ([id number]) -------------------------------------------------------------------------------- Debridement Details Patient Name: Heather Dear A. Date of Service: 03/23/2022 9:00 AM Medical Record Number: [id number] Patient Account Number: [id number] Date of Birth/Sex: March 28, 1976 (46 y.o. F) Treating RN: Cornell Barman Primary Care Provider: Raelene Bott Other Clinician: Massie Kluver Referring Provider: Raelene Bott Treating Provider/Extender: Yaakov Guthrie in Treatment: 1 Debridement Performed for Wound #12 Left,Medial,Plantar Foot Assessment: Performed By: Physician Kalman Shan, MD Debridement Type: Debridement Severity of Tissue Pre Debridement: Fat layer exposed Level of Consciousness (Pre- Awake and Alert procedure): Pre-procedure Verification/Time Out Yes - 10:17 Taken: Start Time: 10:17 Total Area Debrided (L x W): 1.9 (cm) x 3 (cm) = 5.7 (cm) Tissue and other material Viable, Non-Viable, Callus, Slough, Subcutaneous, Slough debrided: Level: Skin/Subcutaneous Tissue Debridement Description: Excisional Instrument: Curette Bleeding: Minimum Hemostasis Achieved: Pressure Response to Treatment: Procedure was tolerated well Level of Consciousness (Post- Awake and Alert procedure): Post Debridement  Measurements of Total Wound Length: (cm) 1.9 Width: (cm) 3 Depth: (cm) 0.2 Volume: (cm) 0.895 Character of Wound/Ulcer Post Debridement: Stable Severity of Tissue Post Debridement: Fat layer exposed Post Procedure Diagnosis Same as Pre-procedure Electronic Signature(s) Signed: 03/23/2022 11:38:35 AM By: Kalman Shan DO Signed: 03/23/2022 4:31:03 PM By: Massie Kluver Signed: 03/24/2022 9:49:48 AM By: Gretta Cool, BSN, RN, CWS, [name] RN, BSN Entered By: Massie Kluver on 03/23/2022 10:37:17 [name], [name] ([id number]) -------------------------------------------------------------------------------- HPI Details Patient Name: Heather Dear A. Date of Service: 03/23/2022 9:00 AM Medical Record Number: [id number] Patient Account Number: [id number] Date of  Birth/Sex: [birth year] (46 y.o. F) Treating RN: Cornell Barman Primary Care Provider: Raelene Bott Other Clinician: Massie Kluver Referring Provider: Raelene Bott Treating Provider/Extender: Yaakov Guthrie in Treatment: 1 History of Present Illness HPI Description: 01/18/18-She is here for initial evaluation of the left great toe ulcer. She is a poor historian in regards to timeframe in detail. She states approximately 4 weeks ago she lacerated her toe on something in the house. She followed up with her primary care who placed her on Bactrim and ultimately a second dose of Bactrim prior to coming to wound clinic. She states she has been treating the toe with peroxide, Betadine and a Band-Aid. She did not check her blood sugar this morning but checked it yesterday morning it was 327; she is unaware of a recent A1c and there are no current records. She saw Dr. she would've orthopedics last week for an old injury to the left ankle, she states he did not see her toe, nor did she bring it to his attention. She smokes approximately 1 pack cigarettes a day. Her social situation is concerning, she arrives this morning with her mother who appears  extremely intoxicated/under the influence; her mother was asked to leave the room and be monitored by the patient's grandmother. The patient's aunt then accompanied the patient and the room throughout the rest of the appointment. We had a lengthy discussion regarding the deleterious effects of uncontrolled hyperglycemia and smoking as it relates to wound healing and overall health. She was strongly encouraged to decrease her smoking and get her diabetes under better control. She states she is currently on a diet and has cut down her [facility] consumption. The left toe is erythematous, macerated and slightly edematous with malodor present. The edema in her left foot is below her baseline, there is no erythema streaking. We will treat her with Santyl, doxycycline; we have ordered and xray, culture and provided a Peg assist surgical shoe and cultured the wound. 01/25/18-She is here in follow-up evaluation for a left great toe ulcer and presents with an abscess to her suprapubic area. She states her blood sugars remain elevated, feeling "sick" and if levels are below 250, but she is trying. She has made no attempt to decrease her smoking stating that we "can't take away her food in her cigarettes". She has been compliant with offloading using the PEG assist you. She is using Santyl daily. the culture obtained last week grew staph aureus and Enterococcus faecalis; continues on the doxycycline and Augmentin was added on Monday. The suprapubic area has erythema, no femoral variation, purple discoloration, minimal induration, was accessed with a cotton tip applicator with sanguinopurulent drainage, this was cultured, I suspect the current antibiotic treatment will cover and we will not add anything to her current treatment plan. She was advised to go to urgent care or ER with any change in redness, induration or fever. 02/01/18-She is here in follow-up evaluation for left great toe ulcers and a new abdominal  abscess from last week. She was able to use packing until earlier this week, where she "forgot it was there". She states she was feeling ill with GI symptoms last week and was not taking her antibiotic. She states her glucose levels have been predominantly less than 200, with occasional levels between 200-250. She thinks this was contributing to her GI symptoms as they have resolved without intervention. There continues to be significant laceration to left toe, otherwise it clinically looks stable/improved. There is now less superficial  opening to the lateral aspect of the great toe that was residual blister. We will transition to [facility] to all wounds, she will continue her Augmentin. If there is no change or deterioration next week for reculture. 02/08/18-She is here in follow-up evaluation for left great toe ulcer and abdominal ulcer. There is an improvement in both wounds. She has been wrapping her left toe with coban, not by our direction, which has created an area of discoloration to the medial aspect; she has been advised to NOT use coban secondary to her neuropathy. She states her glucose levels have been high over this last week ranging from 200-350, she continues to smoke. She admits to being less compliant with her offloading shoe. We will continue with same treatment plan and she will follow-up next week. 02/15/18-She is here in follow-up evaluation for left great toe ulcer and abdominal ulcer. The abdominal ulcer is epithelialized. The left great toe ulcer is improved and all injury from last week using the Coban wrap is resolved, the lateral ulcer is healed. She admits to noncompliance with wearing offloading shoe and admits to glucose levels being greater than 300 most of the week. She continues to smoke and expresses no desire to quit. There is one area medially that probes deeper than it has historically, erythema to the toe and dorsal foot has consistently waxed and waned. There  is no overt signs of cellulitis or infection but we will culture the wound for any occult infection given the new area of depth and erythema. We will hold off on sensitivities for initiation of antibiotic therapy. 02/22/18-She is here in follow up evaluation for left great toe ulcer. There is overall significant improvement in both wound appearance, erythema and edema with changes made last week. She was not initiated on antibiotic therapy. Culture obtained last week showed oxacillin sensitive staph aureus, sensitive to clindamycin. Clindamycin has been called into the pharmacy but she has been instructed to hold off on initiation secondary to overall clinical improvement and her history of antibiotic intolerance. She has been instructed to contact the clinic with any noted changes/deterioration and the wound, erythema, edema and/or pain. She will follow-up next week. She continues to smoke and her glucose levels remain elevated >250; she admits to compliance with offloading shoe 03/01/18 on evaluation today patient appears to be doing fairly well in regard to her left first toe ulcer. She has been tolerating the dressing changes with the [facility] Dressing without complication and overall this has definitely showed signs of improvement according to records as well is what the patient tells me today. I'm very pleased in that regard. She is having no pain today 03/08/18 She is here for follow up evaluation of a left great toe ulcer. She remains non-compliant with glucose control and smoking cessation; glucose levels consistently >200. She states that she got new shoe inserts/peg assist. She admits to compliance with offloading. Since my last evaluation there is significant improvement. We will switch to prisma at this time and she will follow up next week. She is noted to be tachycardic at this appointment, heart rate 120s; she has a history of heart rate 70-130 according to our records. She admits to  extreme agitation r/t personal issues; she was advised to monitor her heartrate and contact her physician if it does not return to a more normal range (<100). She takes cardizem twice daily. 03/15/18-She is here in follow-up evaluation for left great toe ulcer. She remains noncompliant with glucose control  and smoking cessation. She admits to compliance with wearing offloading shoe. The ulcer is improved/stable and we will continue with the same treatment plan and she will follow-up next week 03/22/18-She is here for evaluation for left great toe ulcer. There continues to be significant improvement despite recurrent hyperglycemia (over 500 yesterday) and she continues to smoke. She has been compliant with offloading and we will continue with same treatment plan and she will follow-up next week. 03/29/18-She is here for evaluation for left great toe ulcer. Despite continuing to smoke and uncontrolled diabetes she continues to improve. She is compliant with offloading shoe. We will continue with the same treatment plan and she will follow-up next week 04/05/18- She is here in follow up evaluation for a left great toe ulcer; she presents with small pustule to left fifth toe (resembles ant bite). She admits to compliance with wearing offloading shoe; continues to smoke or have uncontrolled blood glucose control. There is more callus than usual with evidence of bleeding; she denies known trauma. 04/12/18-She is here for evaluation of left great toe ulcer. Despite noncompliance with glycemic control and smoking she continues to make Spratlin, Seynabou A. ([id number]) improvement. She continues to wear offloading shoe. The pustule, that was identified last week, to the left fifth toe is resolved. She will follow-up in 2 weeks 05/03/18-she is seen in follow-up evaluation for a left great toe ulcer. She is compliant with offloading, otherwise noncompliant with glycemic control and smoking. She has plateaued and there  is minimal improvement noted. We will transition to [facility], replaced the insert to her surgical shoe and she will follow-up in one week 05/10/18- She is here in follow up evaluation for a left great toe ulcer. It appears stable despite measurement change. We will continue with same treatment plan and follow up next week. 05/24/18-She is seen in follow-up evaluation for a left great toe ulcer. She remains compliant with offloading, has made significant improvement in her diet, decreasing the amount of sugar/soda. She said her recent A1c was 10.9 which is lower than. She did see a diabetic nutritionist/educator yesterday. She continues to smoke. We will continue with the same treatment plan and she'll follow-up next week. 05/31/18- She is seen in follow-up evaluation for left great toe ulcer. She continues to remain compliant with offloading, continues to make improvement in her diet, increasing her water and decreasing the amount of sugar/soda. She does continue to smoke with no desire to quit. We will apply Prisma to the depth and Hydrofera Blue over. We have not received insurance authorization for oasis. She will follow up next week. 06/07/18-She is seen in follow-up evaluation for left great toe ulcer. It has stalled according to today's measurements although base appears stable. She says she saw a diabetic educator yesterday; her average blood sugars are less than 300 which is an improvement for her. She continues to smoke and states "that's my next step" She continues with water over soda. We will order for xray, culture and reinstate ace wrap compression prior to placing apligraf for next week. She is voicing no complaints or concerns. Her dressing will change to iodoflex over the next week in preparation for apligraf. 06/14/18-She is seen in follow-up evaluation for left great toe ulcer. Plain film x-ray performed last week was negative for osteomyelitis. Wound culture obtained last week grew  strep B and OSSA; she is initiated on keflex and cefdinir today; there is erythema to the toe which could be from ace wrap compression, she  has a history of wrapping too tight and has has been encouraged to maintain ace wraps that we place today. We will hold off on application of apligraf today, will apply next week after antibiotic therapy has been initiated. She admits today that she has resumed taking a shower with her foot/toe submerged in water, she has been reminded to keep foot/toe out of the bath water. She will be seen in follow up next week 06/21/18-she is seen in follow-up evaluation for left great toe ulcer. She is tolerating antibiotic therapy with no GI disturbance. The wound is stable. Apligraf was applied today. She has been decreasing her smoking, only had 4 cigarettes yesterday and 1 today. She continues being more compliant in diabetic diet. She will follow-up next week for evaluation of site, if stable will remove at 2 weeks. 06/28/18- She is here in follow up evalution. Apligraf was placed last week, she states the dressing fell off on Tuesday and she was dressing with hydrofera blue. She is healed and will be discharged from the clinic today. She has been instructed to continue with smoking cessation, continue monitoring glucose levels, offloading for an additional 4 weeks and continue with hydrofera blue for additional two weeks for any possible microscopic opening. Readmission: 08/07/18 on evaluation today patient presents for reevaluation concerning the ulcer of her right great toe. She was previously discharged on 06/28/18 healed. Nonetheless she states that this began to show signs of drainage she subsequently went to her primary care provider. Subsequently an x-ray was performed on 08/01/18 which was negative. The patient was also placed on antibiotics at that time. Fortunately they should have been effective for the infection. Nonetheless she's been experiencing some  improvement but still has a lot of drainage coming from the wound itself. 08/14/18 on evaluation today patient's wound actually does show signs of improvement in regard to the erythema at this point. She has completed the antibiotics. With that being said we did discuss the possibility of placing her in a total contact cast as of today although I think that I may want to give this just a little bit more time to ensure nothing recurrence as far as her infection is concerned. I do not want to put in the cast and risk infection at that time if things are not completely resolved. With that being said she is gonna require some debridement today. 08/21/18 on evaluation today patient actually appears to be doing okay in regard to her toe ulcer. She's been tolerating the dressing changes without complication. With that being said it does appear that she is ready and in fact I think it's appropriate for [place] to go ahead and initiate the total contact cast today. Nonetheless she will require some sharp debridement to prepare the wound for application. Overall I feel like things have been progressing well but we do need to do something to get this to close more readily. 08/24/18 patient seen today for reevaluation after having had the total contact cast applied on Tuesday. She seems to have done very well the wound appears to be doing great and overall I'm pleased with the progress that she's made. There were no abnormal areas of rubbing from the cast on her lower extremity. 08/30/18 on evaluation today patient actually appears to be completely healed in regard to her plantar toe ulcer. She tells me at this point she's been having a lot of issues with the cast. She almost fell a couple of times the state shall the step  of her dog a couple times as well. This is been a very frustrating process for her other nonetheless she has completely healed the wound which is excellent news. Overall there does not appear to be  the evidence of infection at this time which is great news. 09/11/18 evaluation today patient presents for follow-up concerning her great toe ulcer on the left which has unfortunately reopened since I last saw her which was only a couple of weeks ago. Unfortunately she was not able to get in to get the shoe and potentially the AFO that's gonna be necessary due to her left foot drop. She continues with offloading shoe but this is not enough to prevent her from reopening it appears. When we last had her in the total contact cast she did well from a healing standpoint but unfortunately the wound reopened as soon as she came out of the cast within just a couple of weeks. 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 This is also part of the reason why she falls on a regular basis. I just do not believe that is gonna be safe for [place] to reinitiate the total contact cast as last time we had this on she fell 3 times one week which is definitely not normal for her. 09/18/18 upon evaluation today the patient actually appears to be doing about the same in regard to her toe ulcer. She did not contact [facility] as I asked her to even though I had given her the prescription. In fact she actually states that she has no idea where the prescription is. She did apparently call [facility] and they told her that all she needed to do was bring the prescription in order to be able to be seen and work on getting the AFO for her left foot. With all that being said she still does not have an appointment and I'm not sure were things stand that regard. I will give her a new prescription today in order to contact them to get this set up. 09/25/18 on evaluation today patient actually appears to be doing about the same in regard to her toes  ulcer. She does have a small areas which seems to have a lot of callous buildup around the edge of the wound which is going to need sharp debridement today. She still is waiting to be scheduled for evaluation with [facility] for possibility of an AFO. She states there supposed to call her tomorrow to get this set up. Unfortunately it does appear that her foot specifically the toe area is showing signs of erythema. There does not appear to be any systemic infection which is in these good news. [name], [name] ([id number]) 10/02/18 on evaluation today patient actually appears to be doing about the same in regard to her toe ulcer. This really has not done too well although it's not significantly larger it's also not significantly smaller. She has been tolerating the dressing changes without complication. She actually has her appointment with [facility] and [place] tomorrow to hopefully be measured for obtaining and AFO splint. I think this would be helpful preventing this from reoccurring. We had contemplated starting the cast this week although to be honest I am reluctant to do that as she's been having nausea, vomiting, and seizure activity  over the past three days. She has a history of seizures and have been told is nothing that can be done for these. With that being said I do believe that along with the seizures have the nausea vomiting which upon further questioning doesn't seem to be the normal for her and makes me concerned for the possibility of infection or something else going on. I discussed this with the patient and her mother during the office visit today. I do not feel the wound is effective but maybe something else. The responses this was "this just happens to her at times and we [name]'t know why". They did not seem to be interested in going to the hospital to have this checked out further. 10/09/18 on evaluation today patient presents for follow-up concerning her ongoing toe ulcer. She has  been tolerating the dressing changes without complication. Fortunately there does not appear to be any evidence of infection which is great news however I do think that the patient would benefit from going ahead for with the total contact cast. She's actually in a wheelchair today she tells me that she will use her walker if we initiate the cast. I was very specific about the fact that if we were gonna do the cast I wanted to make sure that she was using the walker in order to prevent any falls. She tells me she does not have stairs that she has to traverse on a regular basis at her home. She has not had any seizures since last week again that something that happens to her often she tells me she did talk to [employer] and they said that it may take up to three weeks to get the brace approved for her. Hopefully that will not take that long but nonetheless in the meantime I do think the cast could be of benefit. 10/12/18 on evaluation today patient appears to be doing rather well in regard to her toe ulcer. It's just been a few days and already this is significantly improved both as far as overall appearance and size. Fortunately there's no sign of infection. She is here for her first obligatory cast change. 10/19/18 Seen today for follow up and management of left great toe ulcer. Wound continues to show improvement. Noted small open area with seroussang drainage with palpation. Denies any increased pain or recent fevers during visit. She will continue calcium alginate with offloading shoe. Denies any questions or concerns during visit. 10/26/18 on evaluation today patient appears to be doing about the same as when I last saw her in regard to her wound bed. Fortunately there does not appear to be any signs of infection. Unfortunately she continues to have a breakdown in regard to the toe region any time that she is not in the cast. It takes almost no time at all for this to happen. Nonetheless she still has  not heard anything from the brace being made by [employer] as to when exactly this will be available to her. Fortunately there is no signs of infection at this time. 10/30/18 on evaluation today patient presents for application of the total contact cast as we just received him this morning. Fortunately we are gonna be able to apply this to her today which is great news. She continues to have no significant pain which is good news. Overall I do feel like things have been improving while she was the cast is when she doesn't have a cast that things get worse. She still has not really  heard anything from [place] [facility] regarding her brace. 11/02/18 upon evaluation today patient's wound already appears to be doing significantly better which is good news. Fortunately there does not appear to be any signs of infection also good news. Overall I do think the total contact cast as before is helping to heal this area unfortunately it's just not gonna likely keep the area closed and healed without her getting her brace at least. Again the foot drop is a significant issue for her. 11/09/18 on evaluation today patient appears to be doing excellent in regard to her toe ulcer which in fact is completely healed. Fortunately we finally got the situation squared away with the paperwork which was needed to proceed with getting her brace approved by Medicaid. I have filled that out unfortunately that information has been sent to the orthopedic office that I worked at 2 1/2 years ago and not tired Current wound care measures. Fortunately she seems to be doing very well at this time. 11/23/18 on evaluation today patient appears to be doing More Poorly Compared to Last Time I Saw Her. At [facility] She Had Completely Healed. Currently she is continuing to have issues with reopening. She states that she just found out that the brace was approved through Medicaid now she just has to go get measured in order to have this fitted for her and  then made. Subsequently she does not have an appointment for this yet that is going to complicate things we obviously cannot put her back in the cast if we do not have everything measured because they're not gonna be able to measure her foot while she is in the cast. 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 Obviously this is unfortunate and does have me somewhat worried as well. 11/30/18 on evaluation today patient's toe ulcer actually appears to be doing fairly well. The good news is she will be getting her brace in the shoes next week on Wednesday. Hopefully we will be able to get this to heal without having to go back in the cast however she may need the cast in order to get the wound completely heal and then go from there. Fortunately there's no signs of infection at this time. 12/07/18 on evaluation today patient fortunately did receive her brace and she states she could tell this definitely makes her walk better. With that being said she's been having issues with her toe where she noticed yesterday there was a lot of tissue that was loosing off this appears to be much larger than what it was previous. She also states that her leg has been read putting much across the top of her foot just about the ankle although this seems to be receiving somewhat. The total area is still red and appears to be someone infected as best I can tell. She is previously taken Bactrim and that may be a good option for her today as well. We are gonna see what I wound culture shows as well and I think that this is definitely appropriate. With that being said outside of the culture I still need to initiate something in the interim and that's what I'm gonna go ahead and select Bactrim is a good option for her. 12/14/18 on evaluation today patient appears to be doing better in regard to her left great toe ulcer as compared to  last week's evaluation. There's still  some erythema although this is significantly improved which is excellent news. Overall I do believe that she is making good progress is still gonna take some time before she is where I would like her to be from the standpoint of being able to place her back into the total contact cast. Hopefully we will be where we need to be by next week. 12/21/18 on evaluation today patient actually appears to be doing poorly in regard to her toe ulcer. She's been tolerating the dressing changes without complication. Fortunately there's no signs of systemic infection although she does have a lot of drainage from the toe ulcer and this does seem to be causing some issues at this point. She does have erythema on the distal portion of her toe that appears to be likely cellulitis. 12/28/18 on evaluation today patient actually appears to be doing a little better in my pinion in regard to her toe ulcer. With that being said she still does have some evidence of infection at this time and for her culture she had both E. coli as well as enterococcus as organisms noted on evaluation. For that reason I think that though the Keflex likely has treated the E. coli rather well this has really done nothing for the enterococcus. We are going to have to initiate treatment for this specifically. [name], [name] ([id number]) 01/04/19 on evaluation today patient's toe actually appears to be doing better from the standpoint of infection. She currently would like to see about putting the cash back on I think that this is appropriate as long as she takes care of it and keeps it from getting wet. She is gonna have some drainage we can definitely pass this up with Drawtex and alginate to try to prevent as much drainage as possible from causing the problems. With that being said I do want to at least try her with the cast between now and Tuesday. If there any issues we can't continue to use it then I will  discontinue the use of the cast at that point. 01/08/19 on evaluation today patient actually appears to be doing very well as far as her foot ulcer specifically the great toe on the left is concerned. She did have an area of rubbing on the medial aspect of her left ankle which again is from the cast. Fortunately there's no signs of infection at this point in this appears to be a very slight skin breakdown. The patient tells me she felt it rubbing but didn't think it was that bad. Fortunately there is no signs of active infection at this time which is good news. No fevers, chills, nausea, or vomiting noted at this time. 01/15/19 on evaluation today patient actually appears to be doing well in regard to her toe ulcer. Again as previous she seems to do well and she has the cast on which indicates to me that during the time she doesn't have a cast on she's putting way too much pressure on this region. Obviously I think that's gonna be an issue as with the current national emergency concerning the Covid-19 Virus it has been recommended that we discontinue the use of total contact casting by the chief medical officer of our company, Dr. Simona Huh. The reasoning is that if a patient becomes sick and cannot come into have the cast removed they could not just leave this on for an additional two weeks. Obviously the hospitals also do not want to receive patient's who are sick into the emergency department  to potentially contaminate the region and spread the Covid-19 Virus among other sick individuals within the hospital system. Therefore at this point we are suspending the use of total contact cast until the current emergency subsides. This was all discussed with the patient today as well. 01/22/19 on evaluation today patient's wound on her left great toe appears to be doing slightly worse than previously noted last week. She tells me that she has been on this quite a bit in fact she tells me she's been awake for 38  straight hours. This is due to the fact that she's having to care for grandparents because nobody else will. She has been taking care of them for five the last seven days since I've seen her they both have dementia his is from a stroke and her grandmother's was progressive. Nonetheless she states even her mom who knows her condition and situation has only help two of those days to take care of them she's been taking care of the rest. Fortunately there does not appear to be any signs of active infection in regard to her toe at this point although obviously it doesn't look as good as it did previous. I think this is directly related to her not taking off the pressure and friction by way of taking things easy. Though I completely understand what's going on. 01/29/19 on evaluation today patient's tools are actually appears to be showing some signs of improvement today compared to last week's evaluation as far as not necessarily the overall size of the wound but the fact that she has some new skin growth in between the two ends of the wound opening. Overall I feel like she has done well she states that she had a family member give her what sounds to be a CAM walker boot which has been helpful as well. 02/05/19 on evaluation today patient's wound bed actually appears to be doing significantly better in regard to her overall appearance of the size of the wound. With that being said she is still having an issue with offloading efficiently enough to get this to close. Apparently there is some signs of infection at this point as well unfortunately. Previously she's done well of Augmentin I really do not see anything that needs to be culture currently but there theme and cellulitis of the foot that I'm seeing I'm gonna go ahead and place her on an antibiotic today to try to help clear this up. 02/12/2019 on evaluation today patient actually appears to be doing poorly in regard to her overall wound status. She tells  me she has been using her offloading shoe but actually comes in today wearing her tennis shoe with the AFO brace. Again as I previously discussed with her this is really not sufficient to allow the area to heal appropriately. Nonetheless she continues to be somewhat noncompliant and I do wonder based on what she has told my nurse in the past as to whether or not a good portion of this noncompliance may be recreational drug and alcohol related. She has had a history of heroin overdose and this was fairly recently in the past couple of months that have been seeing her. Nonetheless overall I feel like her wound looks significantly worse today compared to what it was previous. She still has significant erythema despite the Augmentin I am not sure that this is an appropriate medication for her infection I am also concerned that the infection may have gone down into her bone. 02/19/19 on evaluation  today patient actually appears to be doing about the same in regard to her toe ulcer. Unfortunately she continues to show signs of bone exposure and infection at this point. There does not appear to be any evidence of worsening of the infection but I'm also not really sure that it's getting significantly better. She is on the Augmentin which should be sufficient for the Staphylococcus aureus infection that she has at this point. With that being said she may need IV antibiotics to more appropriately treat this. We did have a discussion today about hyperbaric option therapy. 02/28/19 on evaluation today patient actually appears to be doing much worse in regard to the wound on her left great toe as compared to even my previous evaluation last week. Unfortunately this seems to be training in a pretty poor direction. Her toe was actually now starting to angle laterally and I can actually see the entire joint area of the proximal portion of the digit where is the distal portion of the digit again is no longer even in  contact with the joint line. Unfortunately there's a lot more necrotic tissue around the edge and the toe appears to be showing signs of becoming gangrenous in my pinion. I'm very concerned about were things stand at this point. She did see infectious disease and they are planning to send in a prescription for Sivextro for her and apparently this has been approved. With that being said I [name]'t think she should avoid taking this but at the same time I'm not sure that it's gonna be sufficient to save her toe at this point. 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 03/05/19 on evaluation today patient actually appears to be doing quite well in regard to her toe all things considering. She still has bone exposed but there appears to be much less your thing on overall the appearance of the wound and the toe itself is dramatically improved. She still does have some issues currently obviously with infection she did see vascular as well and there concerned that her blood flow to the toad. For that reason they are setting up for an angiogram next week. 03/14/19 on evaluation today patient appears to be doing very poor in regard to her toe and specifically in regard to the ulceration and the fact that she's starting to notice the toe was leaning even more towards the lateral aspect and the complete joint is visible on the proximal aspect of the joint. Nonetheless she's also noted a significant odor and the tip of the toe is turning more dark and necrotic appearing. Overall I think she is getting worse not better as far as this is concerned. For that reason I am recommending at this point that she likely needs to be seen for likely amputation. READMISSION 03/19/2021 This is a patient that we cared for in this  clinic for a prolonged period of time in 2019 and 2020 with a left foot and left first toe wound. 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 ([id number]) 04/09/20 by Dr. Luana Shu. In December 2021 she had an ulcer on her right great toe as well as the fourth and fifth  toes. 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 She also had an angiogram at that time and underwent angioplasty of the right anterior tibial artery. In any case she claims that the wound on the right foot is closed I did not look at this today which was probably an oversight although I think that should be done next week. After her surgery she developed a dehiscence but I do not see any follow-up of this. According to Dr. Deborra Medina last review that she was out of the area being cared for by another physician but recently came back to his attention. 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 A culture of this area showed E. coli apparently before she came back to see Dr. Luana Shu she was supposed to be receiving antibiotics but she did not really take them. Nor is she offloading this area at all. Finally her last hemoglobin A1c listed in epic was in March 2022 at 14.1 she says things are a lot better since then although I am not sure. 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 She had this wound at the time. During this admission she had E. coli sepsis a TEE was negative. 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 She is still a heavy  cigarette smoker. She has a history of bipolar disorder seizure disorder ABI in our clinic was 1.05 6/1; left midfoot in the setting of a TMA done previously. Round circular wound with a "knuckle" of protruding tissue. The problem is that the knuckle was not attached to any of the surrounding granulation and this probed proximally widely I removed a large portion of this tissue. This wound goes with considerable undermining laterally. I do not feel any bone there was no purulence but this is a deep wound. 6/8; in spite of the debridement I did last week. She arrives with a wound looking exactly the same. A protruding "knuckle" of tissue nonadherent to most of the surrounding tissue. There is considerable depth around this from 6-12 o'clock at 2.7 cm and undermining of 1 cm. This does not look overtly infected and the x-ray I did last week was negative for any osseous abnormalities. We have been using silver collagen 6/15; deep tissue culture I did last week showed moderate staph aureus and moderate Pseudomonas. This will definitely require prolonged antibiotic therapy. The pathology on the protuberant area was negative for malignancy fungus etc. 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 We have been using silver collagen. I am going to be prescribing Levaquin for 2 weeks. Her CT scan of the foot is down for 7/5 6/22; CT scan of the foot on 7 5. She says she has hardware in the left leg from her previous fracture. She is on the Levaquin for the deep tissue culture I did that showed methicillin sensitive staph aureus and Pseudomonas. I gave her a 2-week supply and she will have another week. She arrives in clinic today with the same protuberant tissue however this is nonadherent to the tissue surrounding it. I am really at a loss to explain this unless there is underlying deep tissue infection 6/29; patient presents for 1  week follow-up. She  has been using collagen to the wound bed. She reports taking her antibiotics as prescribed.She has no complaints or issues today. She denies signs of infection. 7/6; patient presents for one week followup. She has been using collagen to the wound bed. She states she is taking Levaquin however at times she is not able to keep it down. She denies signs of infection. 7/13; patient presents for 1 week follow-up. She has been using silver alginate to the wound bed. She still has nausea when taking her antibiotics. She denies signs of infection. 7/20; patient presents for 1 week follow-up. She has been using silver alginate with gentamicin cream to the wound bed. She denies any issues and has no complaints today. She denies signs of infection. 7/27; patient presents for 1 week follow-up. She continues to use silver alginate with gentamicin cream to the wound bed. She reports starting her antibiotics. She has no issues or complaints. Overall she reports stability to the wound. 8/3; patient presents for 1 week follow-up. She has been using silver alginate with gentamicin cream to the wound bed. She reports completing all antibiotics. She has no issues or complaints today. She denies signs of infection. 8/17; patient presents for 2-week follow-up. He is to use silver alginate to the wound bed. She has no issues or complaints today. She denies signs of infection. She reports her pain has improved in her foot since last clinic visit 8/24; patient presents for 1 week follow-up. She continues to use silver alginate to the wound bed. She has no issues or complaints. She denies signs of infection. Pain is stable. 9/7; patient presents for follow-up. She missed her last week appointment due to feeling ill. She continues to use silver alginate. She has a new wound to the right lower extremity that is covered in eschar. She states It occurred over the past week and has no idea how it started. She currently denies  signs of infection. 9/14; patient presents for follow-up. To the left foot wound she has been using gentamicin cream and silver alginate. To the right lower extremity wound she has been keeping this covered and has not obtain Santyl. 9/21; patient presents for follow-up. She reports using gentamicin cream and silver alginate to the left foot and Santyl to the right lower extremity wound. She has no issues or complaints today. She denies signs of infection. 9/28; patient presents for follow-up. She reports a new wound to her right heel. She states this occurred a few days ago and is progressively gotten worse. She has been trying to clean the area with a Q-tip and Santyl. She reports stability in the other 2 wounds. She has been using gentamicin cream and silver alginate to the left foot and Santyl to the right lower extremity wound. 10/12; patient presents for follow-up. She reports improvement to the wound beds. She is seeing vein and vascular to discuss the potential of a left BKA. She states they are going to do an arteriogram. She continues to use silver alginate with dressing changes to her wounds. 11/2; patient presents for follow-up. She states she has not been doing dressing changes to the wound beds. She states she is not able to offload the areas. She reports chronic pain to her left foot wound. 11/9; patient presents for follow-up. She came in with only socks on. She states she forgot to put on shoes. It is unclear if she is doing any dressing changes. She currently denies systemic signs of infection.  11/16; patient presents for follow-up. She came again only with socks on. She states she does not wear shoes ever. It is unclear if she does dressing changes. She currently denies systemic signs of infection. 11/23; patient presents for follow-up. She wore her shoes today. It still unclear exactly what dressing she is using for each wound but she did states she obtained Dakin's solution and  has been using this to the left foot wound. She currently denies signs of infection. 11/30; patient presents for follow-up. She has no issues or complaints today. She currently denies signs of infection. 12/7; patient presents for follow-up. She has no issues or complaints today. She has been using Hydrofera Blue to the right heel wound and Dakin solution to the left foot wound. Her right anterior leg wound is healed. She currently denies signs of infection. [name], [name] ([id number]) 12/14; patient presents for follow-up. She has been using Hydrofera Blue to the right heel and Dakin's to the left foot wounds. She has no issues or complaints today. She denies signs of infection. 12/21; patient presents for follow-up. She reports using Hydrofera Blue to the right heel and Dakin's to the left foot wound. She denies signs of infection. 12/28; patient presents for follow-up. She continues to use Dakin's to the left foot wound and Hydrofera Blue to the right heel wound. She denies signs of infection. 1/4; patient presents for follow-up. She has no issues or complaints today. She denies signs of infection. 1/11; patient presents for follow-up. It is unclear if she has been dressing these wounds over the past week. She currently denies signs of infection. 1/18; patient presents for follow-up. She states she has been using Dakin's wet-to-dry dressings to the left foot. She has been using Hydrofera Blue to the right foot foot wound. She states that the anterior right leg wound has reopened and draining serous fluid. She denies signs of infection. 1/25; patient presents for follow-up. She has no issues or complaints today. 2/1; patient presents for follow-up. She has no issues or complaints today. She denies signs of infection. 2/8; patient presents for follow-up. She has lost her surgical shoes. She did not have a dressing to the right heel wound. She currently denies signs of infection. 2/15; patient  presents for follow-up. She reports more pain to the right heel today. She denies purulent drainage Or fever/chills 2/22; patient presents for follow-up. She reports taking clindamycin over the past week. She states that she continues to have pain to her right heel. She reports purulent drainage. Readmission 03/16/2022 Ms. Temima Kutsch is a 46 year old female with a past medical history of type 2 diabetes, osteomyelitis to her feet, chronic systolic heart failure and bipolar disorder that presents to the clinic for bilateral feet wounds and right lower extremity wound. She was last seen in our clinic on 12/15/2021. At that time she had purulent drainage coming out of her right plantar foot and I recommended she go to the ED. She states she went to [facility] and has been there for the past 3 months. I cannot see the records. She states she had OR debridement and was on several weeks of IV antibiotics while inpatient. Since discharge she has not been taking care of the wound beds. She had nothing on her feet other than socks today. She currently denies signs of infection. 5/31; patient presents for follow-up. She has been using Dakin's wet-to-dry dressings to the wound beds on her feet bilaterally and antibiotic ointment to the right  anterior leg wound. She had a wound culture done at last clinic visit that showed moderate Pseudomonas aeruginosa sensitive to ciprofloxacin. She currently denies systemic signs of infection. Electronic Signature(s) Signed: 03/23/2022 11:38:35 AM By: Kalman Shan DO Entered By: Kalman Shan on 03/23/2022 10:49:02 [name] ([id number]) -------------------------------------------------------------------------------- Physical Exam Details Patient Name: Heather Dear A. Date of Service: 03/23/2022 9:00 AM Medical Record Number: [id number] Patient Account Number: [id number] Date of Birth/Sex: 1976-01-02 (46 y.o. F) Treating RN: Cornell Barman Primary Care Provider: Raelene Bott Other Clinician: Massie Kluver Referring Provider: Raelene Bott Treating Provider/Extender: Yaakov Guthrie in Treatment: 1 Constitutional . Cardiovascular . Psychiatric . Notes Right lower extremity: To the anterior aspect there is an open wound with granulation tissue and scant nonviable tissue, Nonviable tissue was easily removed with gauze debridement. To the right plantar heel there is an incision site with increased depth throughout. No probing to bone. Yellow/blueogreen drainage Left lower extremity: To the medial aspect there is an open wound with granulation tissue and nonviable tissue present. Electronic Signature(s) Signed: 03/23/2022 11:38:35 AM By: Kalman Shan DO Entered By: Kalman Shan on 03/23/2022 10:50:15 [name] ([id number]) -------------------------------------------------------------------------------- Physician Orders Details Patient Name: Heather Dear A. Date of Service: 03/23/2022 9:00 AM Medical Record Number: [id number] Patient Account Number: [id number] Date of Birth/Sex: 1975-11-03 (46 y.o. F) Treating RN: Cornell Barman Primary Care Provider: Raelene Bott Other Clinician: Massie Kluver Referring Provider: Raelene Bott Treating Provider/Extender: Yaakov Guthrie in Treatment: 1 Verbal / Phone Orders: No Diagnosis Coding Wound Treatment Services and Therapies o Other Services or Procedures - Keystone Gel Patient Medications Allergies: erythromycin base, Neosporin (neo-bac-polym) Notifications Medication Indication Start End Dakin's Solution 03/23/2022 DOSE 1 - miscellaneous 0.25 % solution - apply to gauze for wet to dry dressings ciprofloxacin HCl 03/23/2022 DOSE 1 - oral 750 mg tablet - 1 tablet oral BID x 10 days Electronic Signature(s) Signed: 03/23/2022 2:48:35 PM By: Kalman Shan DO Signed: 03/23/2022 4:31:03 PM By: Massie Kluver Previous Signature: 03/23/2022  11:38:35 AM Version By: Kalman Shan DO Previous Signature: 03/23/2022 10:42:27 AM Version By: Kalman Shan DO Entered By: Massie Kluver on 03/23/2022 14:41:20 [name], [name] ([id number]) -------------------------------------------------------------------------------- Problem List Details Patient Name: Heather Dear A. Date of Service: 03/23/2022 9:00 AM Medical Record Number: [id number] Patient Account Number: [id number] Date of Birth/Sex: Mar 11, 1976 (46 y.o. F) Treating RN: Cornell Barman Primary Care Provider: Raelene Bott Other Clinician: Massie Kluver Referring Provider: Raelene Bott Treating Provider/Extender: Yaakov Guthrie in Treatment: 1 Active Problems ICD-10 Encounter Code Description 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/2022 No Yes severity L97.512 Non-pressure chronic ulcer of other part of right foot with fat layer 03/16/2022 No Yes exposed E11.621 Type 2 diabetes mellitus with foot ulcer 03/16/2022 No Yes E11.42 Type 2 diabetes mellitus with diabetic polyneuropathy 03/16/2022 No Yes L97.811 Non-pressure chronic ulcer of other part of right lower leg limited to 03/16/2022 No Yes breakdown of skin Inactive Problems Resolved Problems Electronic Signature(s) Signed: 03/23/2022 11:38:35 AM By: Kalman Shan DO Entered By: Kalman Shan on 03/23/2022 10:47:36 [name], [name] ([id number]) -------------------------------------------------------------------------------- Progress Note Details Patient Name: Heather Dear A. Date of Service: 03/23/2022 9:00 AM Medical Record Number: [id number] Patient Account Number: [id number] Date of Birth/Sex: 1976/06/06 (46 y.o. F) Treating RN: Cornell Barman Primary Care Provider: Raelene Bott Other Clinician: Massie Kluver Referring Provider: Raelene Bott Treating Provider/Extender: Yaakov Guthrie in Treatment:  1 Subjective Chief Complaint Information obtained from Patient  03/19/2021; patient referred by Dr. Luana Shu who has been looking after her left foot for quite a period of time for review of a nonhealing area in the left midfoot 03/12/2022; bilateral feet wounds and right lower extremity wound. History of Present Illness (HPI) 01/18/18-She is here for initial evaluation of the left great toe ulcer. She is a poor historian in regards to timeframe in detail. She states approximately 4 weeks ago she lacerated her toe on something in the house. She followed up with her primary care who placed her on Bactrim and ultimately a second dose of Bactrim prior to coming to wound clinic. She states she has been treating the toe with peroxide, Betadine and a Band-Aid. She did not check her blood sugar this morning but checked it yesterday morning it was 327; she is unaware of a recent A1c and there are no current records. She saw Dr. she would've orthopedics last week for an old injury to the left ankle, she states he did not see her toe, nor did she bring it to his attention. She smokes approximately 1 pack cigarettes a day. Her social situation is concerning, she arrives this morning with her mother who appears extremely intoxicated/under the influence; her mother was asked to leave the room and be monitored by the patient's grandmother. The patient's aunt then accompanied the patient and the room throughout the rest of the appointment. We had a lengthy discussion regarding the deleterious effects of uncontrolled hyperglycemia and smoking as it relates to wound healing and overall health. She was strongly encouraged to decrease her smoking and get her diabetes under better control. She states she is currently on a diet and has cut down her [facility] consumption. The left toe is erythematous, macerated and slightly edematous with malodor present. The edema in her left foot is below her baseline, there is no  erythema streaking. We will treat her with Santyl, doxycycline; we have ordered and xray, culture and provided a Peg assist surgical shoe and cultured the wound. 01/25/18-She is here in follow-up evaluation for a left great toe ulcer and presents with an abscess to her suprapubic area. She states her blood sugars remain elevated, feeling "sick" and if levels are below 250, but she is trying. She has made no attempt to decrease her smoking stating that we "can't take away her food in her cigarettes". She has been compliant with offloading using the PEG assist you. She is using Santyl daily. the culture obtained last week grew staph aureus and Enterococcus faecalis; continues on the doxycycline and Augmentin was added on Monday. The suprapubic area has erythema, no femoral variation, purple discoloration, minimal induration, was accessed with a cotton tip applicator with sanguinopurulent drainage, this was cultured, I suspect the current antibiotic treatment will cover and we will not add anything to her current treatment plan. She was advised to go to urgent care or ER with any change in redness, induration or fever. 02/01/18-She is here in follow-up evaluation for left great toe ulcers and a new abdominal abscess from last week. She was able to use packing until earlier this week, where she "forgot it was there". She states she was feeling ill with GI symptoms last week and was not taking her antibiotic. She states her glucose levels have been predominantly less than 200, with occasional levels between 200-250. She thinks this was contributing to her GI symptoms as they have resolved without intervention. There continues to be significant laceration to left toe, otherwise it clinically  looks stable/improved. There is now less superficial opening to the lateral aspect of the great toe that was residual blister. We will transition to [facility] to all wounds, she will continue her Augmentin. If there is  no change or deterioration next week for reculture. 02/08/18-She is here in follow-up evaluation for left great toe ulcer and abdominal ulcer. There is an improvement in both wounds. She has been wrapping her left toe with coban, not by our direction, which has created an area of discoloration to the medial aspect; she has been advised to NOT use coban secondary to her neuropathy. She states her glucose levels have been high over this last week ranging from 200-350, she continues to smoke. She admits to being less compliant with her offloading shoe. We will continue with same treatment plan and she will follow-up next week. 02/15/18-She is here in follow-up evaluation for left great toe ulcer and abdominal ulcer. The abdominal ulcer is epithelialized. The left great toe ulcer is improved and all injury from last week using the Coban wrap is resolved, the lateral ulcer is healed. She admits to noncompliance with wearing offloading shoe and admits to glucose levels being greater than 300 most of the week. She continues to smoke and expresses no desire to quit. There is one area medially that probes deeper than it has historically, erythema to the toe and dorsal foot has consistently waxed and waned. There is no overt signs of cellulitis or infection but we will culture the wound for any occult infection given the new area of depth and erythema. We will hold off on sensitivities for initiation of antibiotic therapy. 02/22/18-She is here in follow up evaluation for left great toe ulcer. There is overall significant improvement in both wound appearance, erythema and edema with changes made last week. She was not initiated on antibiotic therapy. Culture obtained last week showed oxacillin sensitive staph aureus, sensitive to clindamycin. Clindamycin has been called into the pharmacy but she has been instructed to hold off on initiation secondary to overall clinical improvement and her history of antibiotic  intolerance. She has been instructed to contact the clinic with any noted changes/deterioration and the wound, erythema, edema and/or pain. She will follow-up next week. She continues to smoke and her glucose levels remain elevated >250; she admits to compliance with offloading shoe 03/01/18 on evaluation today patient appears to be doing fairly well in regard to her left first toe ulcer. She has been tolerating the dressing changes with the [facility] Dressing without complication and overall this has definitely showed signs of improvement according to records as well is what the patient tells me today. I'm very pleased in that regard. She is having no pain today 03/08/18 She is here for follow up evaluation of a left great toe ulcer. She remains non-compliant with glucose control and smoking cessation; glucose levels consistently >200. She states that she got new shoe inserts/peg assist. She admits to compliance with offloading. Since my last evaluation there is significant improvement. We will switch to prisma at this time and she will follow up next week. She is noted to be tachycardic at this appointment, heart rate 120s; she has a history of heart rate 70-130 according to our records. She admits to extreme agitation r/t personal issues; she was advised to monitor her heartrate and contact her physician if it does not return to a more normal range (<100). She takes cardizem twice daily. 03/15/18-She is here in follow-up evaluation for left great toe  ulcer. She remains noncompliant with glucose control and smoking cessation. She admits to compliance with wearing offloading shoe. The ulcer is improved/stable and we will continue with the same treatment plan and she will follow-up next week 03/22/18-She is here for evaluation for left great toe ulcer. There continues to be significant improvement despite recurrent hyperglycemia (over 500 yesterday) and she continues to smoke. She has been compliant  with offloading and we will continue with same treatment plan and she will follow-up next week. [name], [name] 03/29/18-She is here for evaluation for left great toe ulcer. Despite continuing to smoke and uncontrolled diabetes she continues to improve. She is compliant with offloading shoe. We will continue with the same treatment plan and she will follow-up next week 04/05/18- She is here in follow up evaluation for a left great toe ulcer; she presents with small pustule to left fifth toe (resembles ant bite). She admits to compliance with wearing offloading shoe; continues to smoke or have uncontrolled blood glucose control. There is more callus than usual with evidence of bleeding; she denies known trauma. 04/12/18-She is here for evaluation of left great toe ulcer. Despite noncompliance with glycemic control and smoking she continues to make improvement. She continues to wear offloading shoe. The pustule, that was identified last week, to the left fifth toe is resolved. She will follow-up in 2 weeks 05/03/18-she is seen in follow-up evaluation for a left great toe ulcer. She is compliant with offloading, otherwise noncompliant with glycemic control and smoking. She has plateaued and there is minimal improvement noted. We will transition to [facility], replaced the insert to her surgical shoe and she will follow-up in one week 05/10/18- She is here in follow up evaluation for a left great toe ulcer. It appears stable despite measurement change. We will continue with same treatment plan and follow up next week. 05/24/18-She is seen in follow-up evaluation for a left great toe ulcer. She remains compliant with offloading, has made significant improvement in her diet, decreasing the amount of sugar/soda. She said her recent A1c was 10.9 which is lower than. She did see a diabetic nutritionist/educator yesterday. She continues to smoke. We will continue with the same treatment plan and  she'll follow-up next week. 05/31/18- She is seen in follow-up evaluation for left great toe ulcer. She continues to remain compliant with offloading, continues to make improvement in her diet, increasing her water and decreasing the amount of sugar/soda. She does continue to smoke with no desire to quit. We will apply Prisma to the depth and Hydrofera Blue over. We have not received insurance authorization for oasis. She will follow up next week. 06/07/18-She is seen in follow-up evaluation for left great toe ulcer. It has stalled according to today's measurements although base appears stable. She says she saw a diabetic educator yesterday; her average blood sugars are less than 300 which is an improvement for her. She continues to smoke and states "that's my next step" She continues with water over soda. We will order for xray, culture and reinstate ace wrap compression prior to placing apligraf for next week. She is voicing no complaints or concerns. Her dressing will change to iodoflex over the next week in preparation for apligraf. 06/14/18-She is seen in follow-up evaluation for left great toe ulcer. Plain film x-ray performed last week was negative for osteomyelitis. Wound culture obtained last week grew strep B and OSSA; she is initiated on keflex and cefdinir today; there is erythema to the toe which  could be from ace wrap compression, she has a history of wrapping too tight and has has been encouraged to maintain ace wraps that we place today. We will hold off on application of apligraf today, will apply next week after antibiotic therapy has been initiated. She admits today that she has resumed taking a shower with her foot/toe submerged in water, she has been reminded to keep foot/toe out of the bath water. She will be seen in follow up next week 06/21/18-she is seen in follow-up evaluation for left great toe ulcer. She is tolerating antibiotic therapy with no GI disturbance. The wound  is stable. Apligraf was applied today. She has been decreasing her smoking, only had 4 cigarettes yesterday and 1 today. She continues being more compliant in diabetic diet. She will follow-up next week for evaluation of site, if stable will remove at 2 weeks. 06/28/18- She is here in follow up evalution. Apligraf was placed last week, she states the dressing fell off on Tuesday and she was dressing with hydrofera blue. She is healed and will be discharged from the clinic today. She has been instructed to continue with smoking cessation, continue monitoring glucose levels, offloading for an additional 4 weeks and continue with hydrofera blue for additional two weeks for any possible microscopic opening. Readmission: 08/07/18 on evaluation today patient presents for reevaluation concerning the ulcer of her right great toe. She was previously discharged on 06/28/18 healed. Nonetheless she states that this began to show signs of drainage she subsequently went to her primary care provider. Subsequently an x-ray was performed on 08/01/18 which was negative. The patient was also placed on antibiotics at that time. Fortunately they should have been effective for the infection. Nonetheless she's been experiencing some improvement but still has a lot of drainage coming from the wound itself. 08/14/18 on evaluation today patient's wound actually does show signs of improvement in regard to the erythema at this point. She has completed the antibiotics. With that being said we did discuss the possibility of placing her in a total contact cast as of today although I think that I may want to give this just a little bit more time to ensure nothing recurrence as far as her infection is concerned. I do not want to put in the cast and risk infection at that time if things are not completely resolved. With that being said she is gonna require some debridement today. 08/21/18 on evaluation today patient actually appears to  be doing okay in regard to her toe ulcer. She's been tolerating the dressing changes without complication. With that being said it does appear that she is ready and in fact I think it's appropriate for [place] to go ahead and initiate the total contact cast today. Nonetheless she will require some sharp debridement to prepare the wound for application. Overall I feel like things have been progressing well but we do need to do something to get this to close more readily. 08/24/18 patient seen today for reevaluation after having had the total contact cast applied on Tuesday. She seems to have done very well the wound appears to be doing great and overall I'm pleased with the progress that she's made. There were no abnormal areas of rubbing from the cast on her lower extremity. 08/30/18 on evaluation today patient actually appears to be completely healed in regard to her plantar toe ulcer. She tells me at this point she's been having a lot of issues with the cast. She almost fell a  couple of times the state shall the step of her dog a couple times as well. This is been a very frustrating process for her other nonetheless she has completely healed the wound which is excellent news. Overall there does not appear to be the evidence of infection at this time which is great news. 09/11/18 evaluation today patient presents for follow-up concerning her great toe ulcer on the left which has unfortunately reopened since I last saw her which was only a couple of weeks ago. Unfortunately she was not able to get in to get the shoe and potentially the AFO that's gonna be necessary due to her left foot drop. She continues with offloading shoe but this is not enough to prevent her from reopening it appears. When we last had her in the total contact cast she did well from a healing standpoint but unfortunately the wound reopened as soon as she came out of the cast within just a couple of weeks. 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 This is also part of the reason why she falls on a regular basis. I just do not believe that is gonna be safe for [place] to reinitiate the total contact cast as last time we had this on she fell 3 times one week which is definitely not normal for her. 09/18/18 upon evaluation today the patient actually appears to be doing about the same in regard to her toe ulcer. She did not contact [facility] as I asked her to even though I had given her the prescription. In fact she actually states that she has no idea where the prescription is. She did apparently call [facility] and they told her that all she needed to do was bring the prescription in order to be able to be seen and work on getting the AFO for her left foot. With all that being said she still does not have an appointment and I'm not sure were things stand that regard. I will give her a new prescription today in order to contact them to get this set up. [name], [name] ([id number]) 09/25/18 on evaluation today patient actually appears to be doing about the same in regard to her toes ulcer. She does have a small areas which seems to have a lot of callous buildup around the edge of the wound which is going to need sharp debridement today. She still is waiting to be scheduled for evaluation with [facility] for possibility of an AFO. She states there supposed to call her tomorrow to get this set up. Unfortunately it does appear that her foot specifically the toe area is showing signs of erythema. There does not appear to be any systemic infection which is in these good news. 10/02/18 on evaluation today patient actually appears to be doing about the same in regard to her toe ulcer. This really has not done too well although it's not significantly larger it's  also not significantly smaller. She has been tolerating the dressing changes without complication. She actually has her appointment with [facility] and [facility] [facility] tomorrow to hopefully be measured for obtaining and AFO splint. I think this would be helpful preventing this from reoccurring. We had contemplated starting the cast this week although to be honest I am reluctant to do that as  she's been having nausea, vomiting, and seizure activity over the past three days. She has a history of seizures and have been told is nothing that can be done for these. With that being said I do believe that along with the seizures have the nausea vomiting which upon further questioning doesn't seem to be the normal for her and makes me concerned for the possibility of infection or something else going on. I discussed this with the patient and her mother during the office visit today. I do not feel the wound is effective but maybe something else. The responses this was "this just happens to her at times and we [name]'t know why". They did not seem to be interested in going to the hospital to have this checked out further. 10/09/18 on evaluation today patient presents for follow-up concerning her ongoing toe ulcer. She has been tolerating the dressing changes without complication. Fortunately there does not appear to be any evidence of infection which is great news however I do think that the patient would benefit from going ahead for with the total contact cast. She's actually in a wheelchair today she tells me that she will use her walker if we initiate the cast. I was very specific about the fact that if we were gonna do the cast I wanted to make sure that she was using the walker in order to prevent any falls. She tells me she does not have stairs that she has to traverse on a regular basis at her home. She has not had any seizures since last week again that something that happens to her often she tells me she did talk to  [employer] and they said that it may take up to three weeks to get the brace approved for her. Hopefully that will not take that long but nonetheless in the meantime I do think the cast could be of benefit. 10/12/18 on evaluation today patient appears to be doing rather well in regard to her toe ulcer. It's just been a few days and already this is significantly improved both as far as overall appearance and size. Fortunately there's no sign of infection. She is here for her first obligatory cast change. 10/19/18 Seen today for follow up and management of left great toe ulcer. Wound continues to show improvement. Noted small open area with seroussang drainage with palpation. Denies any increased pain or recent fevers during visit. She will continue calcium alginate with offloading shoe. Denies any questions or concerns during visit. 10/26/18 on evaluation today patient appears to be doing about the same as when I last saw her in regard to her wound bed. Fortunately there does not appear to be any signs of infection. Unfortunately she continues to have a breakdown in regard to the toe region any time that she is not in the cast. It takes almost no time at all for this to happen. Nonetheless she still has not heard anything from the brace being made by [employer] as to when exactly this will be available to her. Fortunately there is no signs of infection at this time. 10/30/18 on evaluation today patient presents for application of the total contact cast as we just received him this morning. Fortunately we are gonna be able to apply this to her today which is great news. She continues to have no significant pain which is good news. Overall I do feel like things have been improving while she was the cast is when she doesn't have a cast that things  get worse. She still has not really heard anything from [place] regarding her brace. 11/02/18 upon evaluation today patient's wound already appears to be doing  significantly better which is good news. Fortunately there does not appear to be any signs of infection also good news. Overall I do think the total contact cast as before is helping to heal this area unfortunately it's just not gonna likely keep the area closed and healed without her getting her brace at least. Again the foot drop is a significant issue for her. 11/09/18 on evaluation today patient appears to be doing excellent in regard to her toe ulcer which in fact is completely healed. Fortunately we finally got the situation squared away with the paperwork which was needed to proceed with getting her brace approved by Medicaid. I have filled that out unfortunately that information has been sent to the orthopedic office that I worked at 2 1/2 years ago and not tired Current wound care measures. Fortunately she seems to be doing very well at this time. 11/23/18 on evaluation today patient appears to be doing More Poorly Compared to Last Time I Saw Her. At [facility] She Had Completely Healed. Currently she is continuing to have issues with reopening. She states that she just found out that the brace was approved through Medicaid now she just has to go get measured in order to have this fitted for her and then made. Subsequently she does not have an appointment for this yet that is going to complicate things we obviously cannot put her back in the cast if we do not have everything measured because they're not gonna be able to measure her foot while she is in the cast. 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 Obviously this is unfortunate and does have me somewhat worried as well. 11/30/18 on evaluation today patient's toe ulcer actually appears to be doing fairly well. The good news is she will be getting her brace in the shoes next week on Wednesday. Hopefully we will be able to get this to heal  without having to go back in the cast however she may need the cast in order to get the wound completely heal and then go from there. Fortunately there's no signs of infection at this time. 12/07/18 on evaluation today patient fortunately did receive her brace and she states she could tell this definitely makes her walk better. With that being said she's been having issues with her toe where she noticed yesterday there was a lot of tissue that was loosing off this appears to be much larger than what it was previous. She also states that her leg has been read putting much across the top of her foot just about the ankle although this seems to be receiving somewhat. The total area is still red and appears to be someone infected as best I can tell. She is previously taken Bactrim and that may be a good option for her today as well. We are gonna see what I wound culture shows as well and I think that this is definitely appropriate. With that being said outside of the culture I still need to initiate something in the interim and that's what I'm gonna go ahead and select Bactrim is a good option for her. 12/14/18 on evaluation today patient appears to be doing better in regard to her left great toe ulcer as  compared to last week's evaluation. There's still some erythema although this is significantly improved which is excellent news. Overall I do believe that she is making good progress is still gonna take some time before she is where I would like her to be from the standpoint of being able to place her back into the total contact cast. Hopefully we will be where we need to be by next week. 12/21/18 on evaluation today patient actually appears to be doing poorly in regard to her toe ulcer. She's been tolerating the dressing changes without complication. Fortunately there's no signs of systemic infection although she does have a lot of drainage from the toe ulcer and this does Butcher, Jearline A.  ([id number]) seem to be causing some issues at this point. She does have erythema on the distal portion of her toe that appears to be likely cellulitis. 12/28/18 on evaluation today patient actually appears to be doing a little better in my pinion in regard to her toe ulcer. With that being said she still does have some evidence of infection at this time and for her culture she had both E. coli as well as enterococcus as organisms noted on evaluation. For that reason I think that though the Keflex likely has treated the E. coli rather well this has really done nothing for the enterococcus. We are going to have to initiate treatment for this specifically. 01/04/19 on evaluation today patient's toe actually appears to be doing better from the standpoint of infection. She currently would like to see about putting the cash back on I think that this is appropriate as long as she takes care of it and keeps it from getting wet. She is gonna have some drainage we can definitely pass this up with Drawtex and alginate to try to prevent as much drainage as possible from causing the problems. With that being said I do want to at least try her with the cast between now and Tuesday. If there any issues we can't continue to use it then I will discontinue the use of the cast at that point. 01/08/19 on evaluation today patient actually appears to be doing very well as far as her foot ulcer specifically the great toe on the left is concerned. She did have an area of rubbing on the medial aspect of her left ankle which again is from the cast. Fortunately there's no signs of infection at this point in this appears to be a very slight skin breakdown. The patient tells me she felt it rubbing but didn't think it was that bad. Fortunately there is no signs of active infection at this time which is good news. No fevers, chills, nausea, or vomiting noted at this time. 01/15/19 on evaluation today patient actually appears to be doing  well in regard to her toe ulcer. Again as previous she seems to do well and she has the cast on which indicates to me that during the time she doesn't have a cast on she's putting way too much pressure on this region. Obviously I think that's gonna be an issue as with the current national emergency concerning the Covid-19 Virus it has been recommended that we discontinue the use of total contact casting by the chief medical officer of our company, Dr. Simona Huh. The reasoning is that if a patient becomes sick and cannot come into have the cast removed they could not just leave this on for an additional two weeks. Obviously the hospitals also do not want to receive  patient's who are sick into the emergency department to potentially contaminate the region and spread the Covid-19 Virus among other sick individuals within the hospital system. Therefore at this point we are suspending the use of total contact cast until the current emergency subsides. This was all discussed with the patient today as well. 01/22/19 on evaluation today patient's wound on her left great toe appears to be doing slightly worse than previously noted last week. She tells me that she has been on this quite a bit in fact she tells me she's been awake for 38 straight hours. This is due to the fact that she's having to care for grandparents because nobody else will. She has been taking care of them for five the last seven days since I've seen her they both have dementia his is from a stroke and her grandmother's was progressive. Nonetheless she states even her mom who knows her condition and situation has only help two of those days to take care of them she's been taking care of the rest. Fortunately there does not appear to be any signs of active infection in regard to her toe at this point although obviously it doesn't look as good as it did previous. I think this is directly related to her not taking off the pressure and friction by  way of taking things easy. Though I completely understand what's going on. 01/29/19 on evaluation today patient's tools are actually appears to be showing some signs of improvement today compared to last week's evaluation as far as not necessarily the overall size of the wound but the fact that she has some new skin growth in between the two ends of the wound opening. Overall I feel like she has done well she states that she had a family member give her what sounds to be a CAM walker boot which has been helpful as well. 02/05/19 on evaluation today patient's wound bed actually appears to be doing significantly better in regard to her overall appearance of the size of the wound. With that being said she is still having an issue with offloading efficiently enough to get this to close. Apparently there is some signs of infection at this point as well unfortunately. Previously she's done well of Augmentin I really do not see anything that needs to be culture currently but there theme and cellulitis of the foot that I'm seeing I'm gonna go ahead and place her on an antibiotic today to try to help clear this up. 02/12/2019 on evaluation today patient actually appears to be doing poorly in regard to her overall wound status. She tells me she has been using her offloading shoe but actually comes in today wearing her tennis shoe with the AFO brace. Again as I previously discussed with her this is really not sufficient to allow the area to heal appropriately. Nonetheless she continues to be somewhat noncompliant and I do wonder based on what she has told my nurse in the past as to whether or not a good portion of this noncompliance may be recreational drug and alcohol related. She has had a history of heroin overdose and this was fairly recently in the past couple of months that have been seeing her. Nonetheless overall I feel like her wound looks significantly worse today compared to what it was previous. She  still has significant erythema despite the Augmentin I am not sure that this is an appropriate medication for her infection I am also concerned that the infection may have  gone down into her bone. 02/19/19 on evaluation today patient actually appears to be doing about the same in regard to her toe ulcer. Unfortunately she continues to show signs of bone exposure and infection at this point. There does not appear to be any evidence of worsening of the infection but I'm also not really sure that it's getting significantly better. She is on the Augmentin which should be sufficient for the Staphylococcus aureus infection that she has at this point. With that being said she may need IV antibiotics to more appropriately treat this. We did have a discussion today about hyperbaric option therapy. 02/28/19 on evaluation today patient actually appears to be doing much worse in regard to the wound on her left great toe as compared to even my previous evaluation last week. Unfortunately this seems to be training in a pretty poor direction. Her toe was actually now starting to angle laterally and I can actually see the entire joint area of the proximal portion of the digit where is the distal portion of the digit again is no longer even in contact with the joint line. Unfortunately there's a lot more necrotic tissue around the edge and the toe appears to be showing signs of becoming gangrenous in my pinion. I'm very concerned about were things stand at this point. She did see infectious disease and they are planning to send in a prescription for Sivextro for her and apparently this has been approved. With that being said I [name]'t think she should avoid taking this but at the same time I'm not sure that it's gonna be sufficient to save her toe at this point. She tells me that she still having to care for grandparents which I think is putting quite a bit of strain on her foot and specifically the total area and has  caused this to break down even to a greater degree than would've otherwise been expected. 03/05/19 on evaluation today patient actually appears to be doing quite well in regard to her toe all things considering. She still has bone exposed but there appears to be much less your thing on overall the appearance of the wound and the toe itself is dramatically improved. She still does have some issues currently obviously with infection she did see vascular as well and there concerned that her blood flow to the toad. For that reason they are setting up for an angiogram next week. 03/14/19 on evaluation today patient appears to be doing very poor in regard to her toe and specifically in regard to the ulceration and the fact that she's starting to notice the toe was leaning even more towards the lateral aspect and the complete joint is visible on the proximal aspect of the joint. Nonetheless she's also noted a significant odor and the tip of the toe is turning more dark and necrotic appearing. Overall I think she is getting worse not better as far as this is concerned. For that reason I am recommending at this point that she likely needs to be seen for likely amputation. [name], [name] ([id number]) READMISSION 03/19/2021 This is a patient that we cared for in this clinic for a prolonged period of time in 2019 and 2020 with a left foot and left first toe wound. 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 Luana Shu. In December 2021 she had an ulcer on her right great toe  as well as the fourth and fifth toes. 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 She also had an angiogram at that time and underwent angioplasty of the right anterior tibial artery. In any case she claims that the wound on the right foot is closed I did not look at this today which was probably  an oversight although I think that should be done next week. After her surgery she developed a dehiscence but I do not see any follow-up of this. According to Dr. Deborra Medina last review that she was out of the area being cared for by another physician but recently came back to his attention. 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 A culture of this area showed E. coli apparently before she came back to see Dr. Luana Shu she was supposed to be receiving antibiotics but she did not really take them. Nor is she offloading this area at all. Finally her last hemoglobin A1c listed in epic was in March 2022 at 14.1 she says things are a lot better since then although I am not sure. 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 She had this wound at the time. During this admission she had E. coli sepsis a TEE was negative. 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 She is still a heavy cigarette smoker. She has a history of bipolar disorder seizure disorder ABI in our clinic was 1.05 6/1; left midfoot in the setting of a TMA done previously. Round circular wound with a "knuckle" of protruding tissue. The problem is that the knuckle was not attached to any of the surrounding granulation and this probed proximally widely I removed a large portion of this tissue. This wound goes with considerable undermining laterally. I do not feel any bone there was no purulence but this is a deep wound. 6/8; in spite of the debridement I did last week. She arrives with a wound looking exactly the same. A protruding "knuckle" of tissue nonadherent to most of the surrounding tissue.  There is considerable depth around this from 6-12 o'clock at 2.7 cm and undermining of 1 cm. This does not look overtly infected and the x-ray I did last week was negative for any osseous abnormalities. We have been using silver collagen 6/15; deep tissue culture I did last week showed moderate staph aureus and moderate Pseudomonas. This will definitely require prolonged antibiotic therapy. The pathology on the protuberant area was negative for malignancy fungus etc. 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 We have been using silver collagen. I am going to be prescribing Levaquin for 2 weeks. Her CT scan of the foot is down for 7/5 6/22; CT scan of the foot on 7 5. She says she has hardware in the left leg from her previous fracture. She is on the Levaquin for the deep tissue culture I did that showed methicillin sensitive staph aureus and Pseudomonas. I gave her a 2-week supply and she will have another week. She arrives in clinic today with the same protuberant tissue however this is nonadherent to the tissue surrounding it. I am really at a loss to explain this unless there is underlying  deep tissue infection 6/29; patient presents for 1 week follow-up. She has been using collagen to the wound bed. She reports taking her antibiotics as prescribed.She has no complaints or issues today. She denies signs of infection. 7/6; patient presents for one week followup. She has been using collagen to the wound bed. She states she is taking Levaquin however at times she is not able to keep it down. She denies signs of infection. 7/13; patient presents for 1 week follow-up. She has been using silver alginate to the wound bed. She still has nausea when taking her antibiotics. She denies signs of infection. 7/20; patient presents for 1 week follow-up. She has been using silver alginate with gentamicin cream to the wound bed. She denies any issues and  has no complaints today. She denies signs of infection. 7/27; patient presents for 1 week follow-up. She continues to use silver alginate with gentamicin cream to the wound bed. She reports starting her antibiotics. She has no issues or complaints. Overall she reports stability to the wound. 8/3; patient presents for 1 week follow-up. She has been using silver alginate with gentamicin cream to the wound bed. She reports completing all antibiotics. She has no issues or complaints today. She denies signs of infection. 8/17; patient presents for 2-week follow-up. He is to use silver alginate to the wound bed. She has no issues or complaints today. She denies signs of infection. She reports her pain has improved in her foot since last clinic visit 8/24; patient presents for 1 week follow-up. She continues to use silver alginate to the wound bed. She has no issues or complaints. She denies signs of infection. Pain is stable. 9/7; patient presents for follow-up. She missed her last week appointment due to feeling ill. She continues to use silver alginate. She has a new wound to the right lower extremity that is covered in eschar. She states It occurred over the past week and has no idea how it started. She currently denies signs of infection. 9/14; patient presents for follow-up. To the left foot wound she has been using gentamicin cream and silver alginate. To the right lower extremity wound she has been keeping this covered and has not obtain Santyl. 9/21; patient presents for follow-up. She reports using gentamicin cream and silver alginate to the left foot and Santyl to the right lower extremity wound. She has no issues or complaints today. She denies signs of infection. 9/28; patient presents for follow-up. She reports a new wound to her right heel. She states this occurred a few days ago and is progressively gotten worse. She has been trying to clean the area with a Q-tip and Santyl. She reports  stability in the other 2 wounds. She has been using gentamicin cream and silver alginate to the left foot and Santyl to the right lower extremity wound. 10/12; patient presents for follow-up. She reports improvement to the wound beds. She is seeing vein and vascular to discuss the potential of a left BKA. She states they are going to do an arteriogram. She continues to use silver alginate with dressing changes to her wounds. 11/2; patient presents for follow-up. She states she has not been doing dressing changes to the wound beds. She states she is not able to offload the areas. She reports chronic pain to her left foot wound. 11/9; patient presents for follow-up. She came in with only socks on. She states she forgot to put on shoes. It is unclear if she is doing any dressing  changes. She currently denies systemic signs of infection. 11/16; patient presents for follow-up. She came again only with socks on. She states she does not wear shoes ever. It is unclear if she does dressing changes. She currently denies systemic signs of infection. 11/23; patient presents for follow-up. She wore her shoes today. It still unclear exactly what dressing she is using for each wound but she did states she obtained Dakin's solution and has been using this to the left foot wound. She currently denies signs of infection. [name], [name] ([id number]) 11/30; patient presents for follow-up. She has no issues or complaints today. She currently denies signs of infection. 12/7; patient presents for follow-up. She has no issues or complaints today. She has been using Hydrofera Blue to the right heel wound and Dakin solution to the left foot wound. Her right anterior leg wound is healed. She currently denies signs of infection. 12/14; patient presents for follow-up. She has been using Hydrofera Blue to the right heel and Dakin's to the left foot wounds. She has no issues or complaints today. She denies signs of  infection. 12/21; patient presents for follow-up. She reports using Hydrofera Blue to the right heel and Dakin's to the left foot wound. She denies signs of infection. 12/28; patient presents for follow-up. She continues to use Dakin's to the left foot wound and Hydrofera Blue to the right heel wound. She denies signs of infection. 1/4; patient presents for follow-up. She has no issues or complaints today. She denies signs of infection. 1/11; patient presents for follow-up. It is unclear if she has been dressing these wounds over the past week. She currently denies signs of infection. 1/18; patient presents for follow-up. She states she has been using Dakin's wet-to-dry dressings to the left foot. She has been using Hydrofera Blue to the right foot foot wound. She states that the anterior right leg wound has reopened and draining serous fluid. She denies signs of infection. 1/25; patient presents for follow-up. She has no issues or complaints today. 2/1; patient presents for follow-up. She has no issues or complaints today. She denies signs of infection. 2/8; patient presents for follow-up. She has lost her surgical shoes. She did not have a dressing to the right heel wound. She currently denies signs of infection. 2/15; patient presents for follow-up. She reports more pain to the right heel today. She denies purulent drainage Or fever/chills 2/22; patient presents for follow-up. She reports taking clindamycin over the past week. She states that she continues to have pain to her right heel. She reports purulent drainage. Readmission 03/16/2022 Ms. Cambreigh Dearing is a 46 year old female with a past medical history of type 2 diabetes, osteomyelitis to her feet, chronic systolic heart failure and bipolar disorder that presents to the clinic for bilateral feet wounds and right lower extremity wound. She was last seen in our clinic on 12/15/2021. At that time she had purulent drainage coming out of her  right plantar foot and I recommended she go to the ED. She states she went to [facility] and has been there for the past 3 months. I cannot see the records. She states she had OR debridement and was on several weeks of IV antibiotics while inpatient. Since discharge she has not been taking care of the wound beds. She had nothing on her feet other than socks today. She currently denies signs of infection. 5/31; patient presents for follow-up. She has been using Dakin's wet-to-dry dressings to the wound beds on her  feet bilaterally and antibiotic ointment to the right anterior leg wound. She had a wound culture done at last clinic visit that showed moderate Pseudomonas aeruginosa sensitive to ciprofloxacin. She currently denies systemic signs of infection. Objective Constitutional Vitals Time Taken: 9:33 AM, Temperature: 98.2 F, Pulse: 69 bpm, Respiratory Rate: 16 breaths/min, Blood Pressure: 102/72 mmHg. General Notes: Right lower extremity: To the anterior aspect there is an open wound with granulation tissue and scant nonviable tissue, Nonviable tissue was easily removed with gauze debridement. To the right plantar heel there is an incision site with increased depth throughout. No probing to bone. Yellow/blue green drainage Left lower extremity: To the medial aspect there is an open wound with granulation tissue and nonviable tissue present. Integumentary (Hair, Skin) Wound #11 status is Open. Original cause of wound was Surgical Injury. The date acquired was: 12/01/2021. The wound has been in treatment 1 weeks. The wound is located on the [place]. The wound measures 4.5cm length x 1.5cm width x 0.9cm depth; 5.301cm^2 area and 4.771cm^3 volume. There is Fat Layer (Subcutaneous Tissue) exposed. There is no tunneling or undermining noted. There is a medium amount of purulent drainage noted. The wound margin is distinct with the outline attached to the wound base. There is  small (1-33%) red granulation within the wound bed. There is a large (67-100%) amount of necrotic tissue within the wound bed including Adherent Slough. Wound #12 status is Open. Original cause of wound was Pressure Injury. The date acquired was: 03/16/2020. The wound has been in treatment 1 weeks. The wound is located on the [place]. The wound measures 1.9cm length x 3cm width x 0.2cm depth; 4.477cm^2 area and 0.895cm^3 volume. There is Fat Layer (Subcutaneous Tissue) exposed. There is no tunneling or undermining noted. There is a medium amount of serous drainage noted. The wound margin is flat and intact. There is small (1-33%) pink granulation within the wound bed. There is a Umbach, Shakima A. ([id number]) large (67-100%) amount of necrotic tissue within the wound bed including Adherent Slough. Wound #13 status is Open. Original cause of wound was Gradually Appeared. The date acquired was: 03/15/2021. The wound has been in treatment 1 weeks. The wound is located on the Right,Medial Lower Leg. The wound measures 0.5cm length x 0.5cm width x 0.1cm depth; 0.196cm^2 area and 0.02cm^3 volume. There is Fat Layer (Subcutaneous Tissue) exposed. There is no tunneling or undermining noted. There is a medium amount of serous drainage noted. The wound margin is flat and intact. There is large (67-100%) pink granulation within the wound bed. There is a small (1-33%) amount of necrotic tissue within the wound bed including Adherent Slough. 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 I debrided nonviable tissue. I recommended  continuing Dakin's wet-to-dry dressings to the feet bilaterally and antibiotic ointment to the anterior right leg wound. She had a wound culture done to the right foot at last clinic visit that grew Pseudomonas. This is consistent with the now yellow/blue green drainage noted on exam today. This is sensitive to ciprofloxacin and I will send this into the pharmacy. I think she would also benefit from [facility] antibiotics and we will order this  as well. I recommended continuing to aggressively offload the wound beds. Follow-up in 1 week. Procedures Wound #11 Pre-procedure diagnosis of Wound #11 is an Open Surgical Wound located on the Right,Plantar Foot . There was a Excisional Skin/Subcutaneous Tissue Debridement with a total area of 6.75 sq cm performed by Kalman Shan, MD. With the following instrument(s): Curette to remove Viable and Non-Viable tissue/material. Material removed includes Subcutaneous Tissue and Slough and after achieving pain control using Lidocaine. A time out was conducted at 10:10, prior to the start of the procedure. A Minimum amount of bleeding was controlled with Pressure. The procedure was tolerated well. Post Debridement Measurements: 4.5cm length x 1.5cm width x 0.9cm depth; 4.771cm^3 volume. Character of Wound/Ulcer Post Debridement is stable. 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 There was a Excisional Skin/Subcutaneous Tissue Debridement with a total area of 5.7 sq cm performed by Kalman Shan, MD. With the following instrument(s): Curette to remove Viable and Non-Viable tissue/material. Material removed includes Callus, Subcutaneous Tissue, and Slough. A time out was conducted at 10:17, prior to the start of the procedure. A Minimum amount  of bleeding was controlled with Pressure. The procedure was tolerated well. Post Debridement Measurements: 1.9cm length x 3cm width x 0.2cm depth; 0.895cm^3 volume. Character of Wound/Ulcer Post Debridement is stable. Severity of Tissue Post Debridement is: Fat layer exposed. Post procedure Diagnosis Wound #12: Same as Pre-Procedure Plan The following medication(s) was prescribed: Dakin's Solution miscellaneous 0.25 % solution 1 apply to gauze for wet to dry dressings starting 03/23/2022 ciprofloxacin HCl oral 750 mg tablet 1 1 tablet oral BID x 10 days starting 03/23/2022 1. Dakin's wet-to-dry 2. Antibiotic ointment 3. Order [facility] antibiotics 3. Ciprofloxacin Nason, Gary A. ([id number]) 4. Follow-up in 1 week 5. Aggressive offloading surgical shoe with peg assist insert Electronic Signature(s) Signed: 03/23/2022 11:38:35 AM By: Kalman Shan DO Entered By: Kalman Shan on 03/23/2022 10:59:26 Smither, Mariea A. ([id number]) -------------------------------------------------------------------------------- SuperBill Details Patient Name: Heather Dear A. Date of Service: 03/23/2022 Medical Record Number: [id number] Patient Account Number: [id number] Date of Birth/Sex: Jan 11, 1976 (46 y.o. F) Treating RN: Cornell Barman Primary Care Provider: Raelene Bott Other Clinician: Massie Kluver Referring Provider: Raelene Bott Treating Provider/Extender: Yaakov Guthrie in Treatment: 1 Diagnosis Coding ICD-10 Codes Code Description [phone] 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ity 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.621 Type 2 diabetes mellitus with foot ulcer E11.42 Type 2 diabetes mellitus with diabetic polyneuropathy L97.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 of skin Facility Procedures CPT4 Code: [id number] Description: 11042 - DEB  SUBQ TISSUE 20 SQ CM/< Modifier: Quantity: 1 CPT4 Code: Description: ICD-10 Diagnosis Description 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 L97.512 Non-pressure chronic ulcer of other part of right foot with fat layer expo E11.621 Type 2 diabetes mellitus with foot  ulcer Modifier: severity sed Quantity: Physician Procedures CPT4 Code: [id number] Description: 99214 - WC PHYS LEVEL 4 - EST PT Modifier: Quantity: 1 CPT4 Code: Description: ICD-10 Diagnosis Description L97.512 Non-pressure chronic ulcer of other part of right foot with fat layer expos 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 E11.621 Type 2 diabetes mellitus with foot  ulcer L97.811 Non-pressure chronic ulcer of other part of right lower leg limited to brea Modifier: ed severity kdown of skin Quantity: CPT4 Code: 6629476 Description: 54650 - WC PHYS SUBQ TISS 20 SQ CM Modifier: Quantity: 1 CPT4 Code: Description: ICD-10 Diagnosis Description 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 L97.512 Non-pressure chronic ulcer of other part of right foot with fat layer expos E11.621 Type 2 diabetes mellitus with foot  ulcer Modifier: severity ed Quantity: Electronic Signature(s) Signed: 03/23/2022 11:38:35 AM By: Kalman Shan DO Entered By: Kalman Shan on 03/23/2022 10:59:35

## 2022-03-24 NOTE — Progress Notes (Signed)
[name], [name] ([id number]) Visit Report for 03/23/2022 Arrival Information Details Patient Name: [name], [name] Date of Service: 03/23/2022 9:00 AM Medical Record Number: [id number] Patient Account Number: [id number] Date of Birth/Sex: 03-24-1976 (46 y.o. F) Treating RN: Cornell Barman Primary Care Surya Schroeter: Raelene Bott Other Clinician: Massie Kluver Referring Diamonique Ruedas: Raelene Bott Treating Lauretta Sallas/Extender: Yaakov Guthrie in Treatment: 1 Visit Information History Since Last Visit Added or deleted any medications: No Patient Arrived: Wheel Chair Any new allergies or adverse reactions: No Arrival Time: 09:26 Had a fall or experienced change in No Transfer Assistance: EasyPivot Patient Lift activities of daily living that may affect risk of falls: Hospitalized since last visit: No Pain Present Now: Yes Electronic Signature(s) Signed: 03/23/2022 4:31:03 PM By: Massie Kluver Entered By: Massie Kluver on 03/23/2022 09:33:20 Rathel, Rakeisha AMarland Kitchen ([id number]) -------------------------------------------------------------------------------- Lower Extremity Assessment Details Patient Name: Heather Dear A. Date of Service: 03/23/2022 9:00 AM Medical Record Number: [id number] Patient Account Number: [id number] Date of Birth/Sex: 03-16-1976 (46 y.o. F) Treating RN: Cornell Barman Primary Care Nadene Witherspoon: Raelene Bott Other Clinician: Massie Kluver Referring Dayja Loveridge: Raelene Bott Treating Alyan Hartline/Extender: Yaakov Guthrie in Treatment: 1 Edema Assessment Assessed: [Left: Yes] [Right: Yes] Edema: [Left: Yes] [Right: Yes] Calf Left: Right: Point of Measurement: 31 cm From Medial Instep 31.7 cm 33.7 cm Ankle Left: Right: Point of Measurement: 9 cm From Medial Instep 22.5 cm 22.5 cm Vascular Assessment Pulses: Dorsalis Pedis Palpable: [Left:Yes] [Right:Yes] Electronic Signature(s) Signed: 03/23/2022 4:31:03 PM By: Massie Kluver Signed: 03/24/2022 9:49:48 AM By:  Gretta Cool, BSN, RN, CWS, [name] RN, BSN Entered By: Massie Kluver on 03/23/2022 09:57:00 Glymph, Galileah A. ([id number]) -------------------------------------------------------------------------------- Multi Wound Chart Details Patient Name: Heather Dear A. Date of Service: 03/23/2022 9:00 AM Medical Record Number: [id number] Patient Account Number: [id number] Date of Birth/Sex: 1975-11-14 (46 y.o. 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 Comorbid History: Chronic sinus problems/congestion, Chronic sinus problems/congestion, Chronic sinus problems/congestion, Middle ear problems, Anemia, Middle ear problems, Anemia, Middle ear problems, Anemia, Chronic Obstructive Pulmonary Chronic Obstructive Pulmonary Chronic Obstructive Pulmonary Disease (COPD), Congestive Heart Disease (COPD), Congestive Heart Disease (COPD), Congestive Heart Failure, Type II Diabetes, End Stage Failure, Type II Diabetes, End Stage Failure, Type II Diabetes, End Stage Renal Disease, History of pressure Renal Disease, History of pressure Renal Disease, History of pressure wounds, Neuropathy wounds, Neuropathy wounds, Neuropathy Date Acquired: 12/01/2021 03/16/2020 03/15/2021 Weeks of Treatment: 1 1 1  Wound Status: Open Open Open Wound Recurrence: No No No Measurements L x W x D (cm) 4.5x1.5x0.9 1.9x3x0.2 0.5x0.5x0.1 Area  (cm) : 5.301 4.477 0.196 Volume (cm) : 4.771 0.895 0.02 % Reduction in Area: -187.20% 5.00% 28.70% % Reduction in Volume: -187.20% 5.00% 25.90% Classification: Full Thickness Without Exposed Grade 3 Grade 1 Support Structures Exudate Amount: Medium Medium Medium Exudate Type: Purulent Serous Serous Exudate Color: yellow, brown, green amber amber Wound Margin: Distinct, outline attached Flat and Intact Flat and Intact Granulation Amount: Small (1-33%) Small (1-33%) Large (67-100%) Granulation Quality: Red Pink Pink Necrotic Amount: Large (67-100%) Large (67-100%) Small (1-33%) Exposed Structures: Fat Layer (Subcutaneous Tissue): Fat Layer (  Subcutaneous Tissue): Fat Layer (Subcutaneous Tissue): Yes Yes Yes Fascia: No Fascia: No Fascia: No Tendon: No Tendon: No Tendon: No Muscle: No Muscle: No Muscle: No Joint: No Joint: No Joint: No Bone: No Bone: No Bone: No Epithelialization: N/A None N/A Treatment Notes [name], [name] ([id number]) Electronic Signature(s) Signed: 03/23/2022 4:31:03 PM By: Massie Kluver Entered By: Massie Kluver on 03/23/2022 10:12:38 [name] ([id number]) -------------------------------------------------------------------------------- Multi-Disciplinary Care Plan Details Patient Name: Heather Dear A. Date of Service: 03/23/2022 9:00 AM Medical Record Number: [id number] Patient Account Number: [id number] Date of Birth/Sex: 08-31-1976 (46 y.o. F) Treating RN: Cornell Barman Primary Care Thaer Miyoshi: Raelene Bott Other Clinician: Massie Kluver Referring Alianna Wurster: Raelene Bott Treating Emylee Decelle/Extender: Yaakov Guthrie in Treatment: 1 Active Inactive Abuse / Safety / Falls / Self Care Management Nursing Diagnoses: History of Falls Potential for falls Potential for injury related to falls Goals: Patient will not develop complications from immobility Date Initiated: 03/17/2022 Target Resolution Date: 03/16/2022 Goal Status:  Active Patient/caregiver will verbalize understanding of skin care regimen Date Initiated: 03/17/2022 Target Resolution Date: 03/16/2022 Goal Status: Active Patient/caregiver will verbalize/demonstrate measure taken to improve self care Date Initiated: 03/17/2022 Target Resolution Date: 03/16/2022 Goal Status: Active Interventions: Assess fall risk on admission and as needed Provide education on basic hygiene Provide education on personal and home safety Notes: Necrotic Tissue Nursing Diagnoses: Impaired tissue integrity related to necrotic/devitalized tissue Knowledge deficit related to management of necrotic/devitalized tissue Goals: Necrotic/devitalized tissue will be minimized in the wound bed Date Initiated: 03/17/2022 Target Resolution Date: 03/16/2022 Goal Status: Active Patient/caregiver will verbalize understanding of reason and process for debridement of necrotic tissue Date Initiated: 03/17/2022 Target Resolution Date: 03/16/2022 Goal Status: Active Interventions: Provide education on necrotic tissue and debridement process Treatment Activities: Apply topical anesthetic as ordered : 03/16/2022 Excisional debridement : 03/16/2022 Notes: Orientation to the Wound Care Program Nursing Diagnoses: Knowledge deficit related to the wound healing center program GURBANI, FIGGE A. ([id number]) Goals: Patient/caregiver will verbalize understanding of the [place] Date Initiated: 03/17/2022 Target Resolution Date: 03/16/2022 Goal Status: Active Interventions: Provide education on orientation to the wound center Notes: Osteomyelitis Nursing Diagnoses: Infection: osteomyelitis Knowledge deficit related to disease process and management Goals: Patient/caregiver will verbalize understanding of disease process and disease management Date Initiated: 03/17/2022 Target Resolution Date: 03/16/2022 Goal Status: Active Patient's osteomyelitis will resolve Date  Initiated: 03/17/2022 Target Resolution Date: 03/16/2022 Goal Status: Active Signs and symptoms for osteomyelitis will be recognized and promptly addressed Date Initiated: 03/17/2022 Target Resolution Date: 03/16/2022 Goal Status: Active Interventions: Assess for signs and symptoms of osteomyelitis resolution every visit Provide education on osteomyelitis Treatment Activities: Systemic antibiotics : 03/16/2022 Notes: Wound/Skin Impairment Nursing Diagnoses: Impaired tissue integrity Knowledge deficit related to smoking impact on wound healing Knowledge deficit related to ulceration/compromised skin integrity Goals: Patient/caregiver will verbalize understanding of skin care regimen Date Initiated: 03/17/2022 Target Resolution Date: 03/16/2022 Goal Status: Active Ulcer/skin breakdown will have a volume reduction of 30% by week 4 Date Initiated: 03/17/2022 Target Resolution Date: 04/13/2022 Goal Status: Active Ulcer/skin breakdown will have a volume reduction of 50% by week 8 Date Initiated: 03/17/2022 Target Resolution Date: 05/04/2022 Goal Status: Active Ulcer/skin breakdown will have a volume reduction of 80% by week 12 Date Initiated: 03/17/2022 Target Resolution Date: 06/01/2022 Goal Status: Active Ulcer/skin breakdown will heal within 14 weeks Date Initiated: 03/17/2022 Target Resolution Date: 06/15/2022 Goal Status: Active Interventions: Assess ulceration(s) every visit Provide education on ulcer and skin care Treatment Activities: Skin care regimen initiated :  03/16/2022 KELTY, SZAFRAN ([id number]) Notes: Electronic Signature(s) Signed: 03/23/2022 4:31:03 PM By: Massie Kluver Signed: 03/24/2022 9:49:48 AM By: Gretta Cool, BSN, RN, CWS, [name] RN, BSN Entered By: Massie Kluver on 03/23/2022 09:57:06 [name] ([id number]) -------------------------------------------------------------------------------- Pain Assessment Details Patient Name: Heather Dear A. Date of Service:  03/23/2022 9:00 AM Medical Record Number: [id number] Patient Account Number: [id number] Date of Birth/Sex: 10/18/1976 (46 y.o. F) Treating RN: Cornell Barman Primary Care Daley Mooradian: Raelene Bott Other Clinician: Massie Kluver Referring Solange Emry: Raelene Bott Treating Jasmond River/Extender: Yaakov Guthrie in Treatment: 1 Active Problems Location of Pain Severity and Description of Pain Patient Has Paino Yes Site Locations Pain Location: Pain in Ulcers With Dressing Change: No Duration of the Pain. Constant / Intermittento Constant Character of Pain Describe the Pain: Burning Pain Management and Medication Current Pain Management: Rest: Yes How does your wound impact your activities of daily livingo Sleep: Yes Electronic Signature(s) Signed: 03/23/2022 4:31:03 PM By: Massie Kluver Signed: 03/24/2022 9:49:48 AM By: Gretta Cool, BSN, RN, CWS, [name] RN, BSN Entered By: Massie Kluver on 03/23/2022 09:38:24 [name] ([id number]) -------------------------------------------------------------------------------- Patient/Caregiver Education Details Patient Name: Heather Dear A. Date of Service: 03/23/2022 9:00 AM Medical Record Number: [id number] Patient Account Number: [id number] Date of Birth/Gender: 1976-07-21 (46 y.o. F) Treating RN: Cornell Barman Primary Care Physician: Raelene Bott Other Clinician: Massie Kluver Referring Physician: Raelene Bott Treating Physician/Extender: Yaakov Guthrie in Treatment: 1 Education Assessment Education Provided To: Patient Education Topics Provided Infection: Handouts: Infection Prevention and Management Methods: Explain/Verbal Responses: State content correctly Wound/Skin Impairment: Handouts: Other: continue wound care as directed Electronic Signature(s) Signed: 03/23/2022 4:31:03 PM By: Massie Kluver Entered By: Massie Kluver on 03/23/2022 11:06:54 Fletcher, Ruhenstroth  ([id number]) -------------------------------------------------------------------------------- Wound Assessment Details Patient Name: Heather Dear A. Date of Service: 03/23/2022 9:00 AM Medical Record Number: [id number] Patient Account Number: [id number] Date of Birth/Sex: 03-Feb-1976 (46 y.o. F) Treating RN: Cornell Barman Primary Care Dennard Vezina: Raelene Bott Other Clinician: Massie Kluver Referring Wandra Babin: Raelene Bott Treating Kirsti Mcalpine/Extender: Yaakov Guthrie in Treatment: 1 Wound Status Wound Number: 11 Primary Open Surgical Wound Etiology: Wound Location: Right, Plantar Foot Wound Open Wounding Event: Surgical Injury Status: Date Acquired: 12/01/2021 Comorbid Chronic sinus problems/congestion, Middle ear problems, Weeks Of Treatment: 1 History: Anemia, Chronic Obstructive Pulmonary Disease (COPD), Clustered Wound: No Congestive Heart Failure, Type II Diabetes, End Stage Renal Disease, History of pressure wounds, Neuropathy Photos Wound Measurements Length: (cm) 4.5 Width: (cm) 1.5 Depth: (cm) 0.9 Area: (cm) 5.301 Volume: (cm) 4.771 % Reduction in Area: -187.2% % Reduction in Volume: -187.2% Tunneling: No Undermining: No Wound Description Classification: Full Thickness Without Exposed Support Structu Wound Margin: Distinct, outline attached Exudate Amount: Medium Exudate Type: Purulent Exudate Color: yellow, brown, green res Foul Odor After Cleansing: No Slough/Fibrino Yes Wound Bed Granulation Amount: Small (1-33%) Exposed Structure Granulation Quality: Red Fascia Exposed: No Necrotic Amount: Large (67-100%) Fat Layer (Subcutaneous Tissue) Exposed: Yes Necrotic Quality: Adherent Slough Tendon Exposed: No Muscle Exposed: No Joint Exposed: No Bone Exposed: No Electronic Signature(s) Signed: 03/23/2022 4:31:03 PM By: Massie Kluver Signed: 03/24/2022 9:49:48 AM By: Gretta Cool, BSN, RN, CWS, [name] RN, BSN Entered By: Massie Kluver on 03/23/2022  09:53:57 Beringer, Daisja A. ([id number]) -------------------------------------------------------------------------------- Wound Assessment Details Patient Name: Heather Dear A. Date of Service: 03/23/2022 9:00 AM Medical Record Number: [id number] Patient Account Number: [id number] Date of Birth/Sex: [birth year] (46 y.o. F) Treating RN: Cornell Barman Primary Care Lea Baine: Raelene Bott Other Clinician: Massie Kluver Referring Rommie Dunn: Raelene Bott Treating Beryl Balz/Extender: Yaakov Guthrie in  Treatment: 1 Wound Status Wound Number: 12 Primary Diabetic Wound/Ulcer of the Lower Extremity Etiology: Wound Location: Left, Medial, Plantar Foot Wound Open Wounding Event: Pressure Injury Status: Date Acquired: 03/16/2020 Comorbid Chronic sinus problems/congestion, Middle ear problems, Weeks Of Treatment: 1 History: Anemia, Chronic Obstructive Pulmonary Disease (COPD), Clustered Wound: No Congestive Heart Failure, Type II Diabetes, End Stage Renal Disease, History of pressure wounds, Neuropathy Photos Wound Measurements Length: (cm) 1.9 Width: (cm) 3 Depth: (cm) 0.2 Area: (cm) 4.477 Volume: (cm) 0.895 % Reduction in Area: 5% % Reduction in Volume: 5% Epithelialization: None Tunneling: No Undermining: No Wound Description Classification: Grade 3 Wound Margin: Flat and Intact Exudate Amount: Medium Exudate Type: Serous Exudate Color: amber Foul Odor After Cleansing: No Slough/Fibrino Yes Wound Bed Granulation Amount: Small (1-33%) Exposed Structure Granulation Quality: Pink Fascia Exposed: No Necrotic Amount: Large (67-100%) Fat Layer (Subcutaneous Tissue) Exposed: Yes Necrotic Quality: Adherent Slough Tendon Exposed: No Muscle Exposed: No Joint Exposed: No Bone Exposed: No Electronic Signature(s) Signed: 03/23/2022 4:31:03 PM By: Massie Kluver Signed: 03/24/2022 9:49:48 AM By: Gretta Cool, BSN, RN, CWS, [name] RN, BSN Entered By: Massie Kluver on 03/23/2022  09:54:58 Szumski, Davon A. ([id number]) -------------------------------------------------------------------------------- Wound Assessment Details Patient Name: Heather Dear A. Date of Service: 03/23/2022 9:00 AM Medical Record Number: [id number] Patient Account Number: [id number] Date of Birth/Sex: 07-May-1976 (46 y.o. F) Treating RN: Cornell Barman Primary Care Virgilio Broadhead: Raelene Bott Other Clinician: Massie Kluver Referring Adolphus Hanf: Raelene Bott Treating Martavious Hartel/Extender: Yaakov Guthrie in Treatment: 1 Wound Status Wound Number: 13 Primary Diabetic Wound/Ulcer of the Lower Extremity Etiology: Wound Location: Right, Medial Lower Leg Wound Open Wounding Event: Gradually Appeared Status: Date Acquired: 03/15/2021 Comorbid Chronic sinus problems/congestion, Middle ear problems, Weeks Of Treatment: 1 History: Anemia, Chronic Obstructive Pulmonary Disease (COPD), Clustered Wound: No Congestive Heart Failure, Type II Diabetes, End Stage Renal Disease, History of pressure wounds, Neuropathy Photos Wound Measurements Length: (cm) 0.5 Width: (cm) 0.5 Depth: (cm) 0.1 Area: (cm) 0.196 Volume: (cm) 0.02 % Reduction in Area: 28.7% % Reduction in Volume: 25.9% Tunneling: No Undermining: No Wound Description Classification: Grade 1 Wound Margin: Flat and Intact Exudate Amount: Medium Exudate Type: Serous Exudate Color: amber Foul Odor After Cleansing: No Slough/Fibrino Yes Wound Bed Granulation Amount: Large (67-100%) Exposed Structure Granulation Quality: Pink Fascia Exposed: No Necrotic Amount: Small (1-33%) Fat Layer (Subcutaneous Tissue) Exposed: Yes Necrotic Quality: Adherent Slough Tendon Exposed: No Muscle Exposed: No Joint Exposed: No Bone Exposed: No Electronic Signature(s) Signed: 03/23/2022 4:31:03 PM By: Massie Kluver Signed: 03/24/2022 9:49:48 AM By: Gretta Cool, BSN, RN, CWS, [name] RN, BSN Entered By: Massie Kluver on 03/23/2022 09:56:13 Dilmore, Maddisen A.  ([id number]) -------------------------------------------------------------------------------- [place] Details Patient Name: Heather Dear A. Date of Service: 03/23/2022 9:00 AM Medical Record Number: [id number] Patient Account Number: [id number] Date of Birth/Sex: May 03, 1976 (46 y.o. F) Treating RN: Cornell Barman Primary Care Sirus Labrie: Raelene Bott Other Clinician: Massie Kluver Referring Reno Clasby: Raelene Bott Treating Aila Terra/Extender: Yaakov Guthrie in Treatment: 1 Vital Signs Time Taken: 09:33 Temperature (F): 98.2 Pulse (bpm): 69 Respiratory Rate (breaths/min): 16 Blood Pressure (mmHg): 102/72 Reference Range: 80 - 120 mg / dl Electronic Signature(s) Signed: 03/23/2022 4:31:03 PM By: Massie Kluver Entered By: Massie Kluver on 03/23/2022 09:37:32

## 2022-03-30 ENCOUNTER — Ambulatory Visit: Payer: Medicaid Other | Admitting: Physician Assistant

## 2022-04-06 ENCOUNTER — Encounter: Payer: Medicaid Other | Attending: Internal Medicine | Admitting: Internal Medicine

## 2022-04-06 DIAGNOSIS — E11621 Type 2 diabetes mellitus with foot ulcer: Secondary | ICD-10-CM | POA: Insufficient documentation

## 2022-04-06 DIAGNOSIS — L97512 Non-pressure chronic ulcer of other part of right foot with fat layer exposed: Secondary | ICD-10-CM | POA: Insufficient documentation

## 2022-04-06 DIAGNOSIS — E11622 Type 2 diabetes mellitus with other skin ulcer: Secondary | ICD-10-CM | POA: Diagnosis not present

## 2022-04-06 DIAGNOSIS — L97522 Non-pressure chronic ulcer of other part of left foot with fat layer exposed: Secondary | ICD-10-CM | POA: Diagnosis not present

## 2022-04-06 DIAGNOSIS — L97812 Non-pressure chronic ulcer of other part of right lower leg with fat layer exposed: Secondary | ICD-10-CM | POA: Diagnosis not present

## 2022-04-06 DIAGNOSIS — L97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ity: Secondary | ICD-10-CM | POA: Diagnosis not present

## 2022-04-06 DIAGNOSIS — E1142 Type 2 diabetes mellitus with diabetic polyneuropathy: Secondary | ICD-10-CM | POA: Insufficient documentation

## 2022-04-06 NOTE — Progress Notes (Signed)
TINEY, ZIPPER ([id number]) Visit Report for 04/06/2022 Chief Complaint Document Details Patient Name: [name], [name] Date of Service: 04/06/2022 11:15 AM Medical Record Number: [id number] Patient Account Number: [id number] Date of Birth/Sex: September 04, 1976 (46 y.o. F) Treating RN: Cornell Barman Primary Care Provider: Raelene Bott Other Clinician: Referring Provider: Raelene Bott Treating Provider/Extender: Yaakov Guthrie in Treatment: 3 Information Obtained from: Patient Chief Complaint 03/19/2021; patient referred by Dr. Luana Shu who has been looking after her left foot for quite a period of time for review of a nonhealing area in the left midfoot 03/12/2022; bilateral feet wounds and right lower extremity wound. Electronic Signature(s) Signed: 04/06/2022 12:42:30 PM By: Kalman Shan DO Entered By: Kalman Shan on 04/06/2022 12:34:22 [name] ([id number]) -------------------------------------------------------------------------------- Debridement Details Patient Name: Heather Dear A. Date of Service: 04/06/2022 11:15 AM Medical Record Number: [id number] Patient Account Number: [id number] Date of Birth/Sex: 1976-02-24 (46 y.o. F) Treating RN: Levora Dredge Primary Care Provider: Raelene Bott Other Clinician: Referring Provider: Raelene Bott Treating Provider/Extender: Yaakov Guthrie in Treatment: 3 Debridement Performed for Wound #11 Right,Plantar Foot Assessment: Performed By: Physician Kalman Shan, MD Debridement Type: Debridement Level of Consciousness (Pre- Awake and Alert procedure): Pre-procedure Verification/Time Out Yes - 12:20 Taken: Start Time: 12:20 Total Area Debrided (L x W): 2.5 (cm) x 1 (cm) = 2.5 (cm) Tissue and other material Viable, Non-Viable, Callus, Slough, Subcutaneous, Slough debrided: Level: Skin/Subcutaneous Tissue Debridement Description: Excisional Instrument: Curette Bleeding: Minimum Hemostasis  Achieved: Pressure End Time: 12:24 Response to Treatment: Procedure was tolerated well Level of Consciousness (Post- Awake and Alert procedure): Post Debridement Measurements of Total Wound Length: (cm) 2.3 Width: (cm) 0.8 Depth: (cm) 0.9 Volume: (cm) 1.301 Character of Wound/Ulcer Post Debridement: Stable Post Procedure Diagnosis Same as Pre-procedure Electronic Signature(s) Signed: 04/06/2022 12:42:30 PM By: Kalman Shan DO Signed: 04/06/2022 4:18:56 PM By: Levora Dredge Entered By: Levora Dredge on 04/06/2022 12:25:29 Heather Dear A. ([id number]) -------------------------------------------------------------------------------- Debridement Details Patient Name: Heather Dear A. Date of Service: 04/06/2022 11:15 AM Medical Record Number: [id number] Patient Account Number: [id number] Date of Birth/Sex: [birth year] (46 y.o. F) Treating RN: Levora Dredge Primary Care Provider: Raelene Bott Other Clinician: Referring Provider: Raelene Bott Treating Provider/Extender: Yaakov Guthrie in Treatment: 3 Debridement Performed for Wound #12 Left,Medial,Plantar Foot Assessment: Performed By: Physician Kalman Shan, MD Debridement Type: Debridement Severity of Tissue Pre Debridement: Fat layer exposed Level of Consciousness (Pre- Awake and Alert procedure): Pre-procedure Verification/Time Out Yes - 12:25 Taken: Start Time: 12:25 Total Area Debrided (L x W): 2 (cm) x 3.5 (cm) = 7 (cm) Tissue and other material Viable, Non-Viable, Callus, Slough, Subcutaneous, Slough debrided: Level: Skin/Subcutaneous Tissue Debridement Description: Excisional Instrument: Curette Bleeding: Minimum Hemostasis Achieved: Pressure End Time: 12:30 Response to Treatment: Procedure was tolerated well Level of Consciousness (Post- Awake and Alert procedure): Post Debridement Measurements of Total Wound Length: (cm) 1.6 Width: (cm) 3.2 Depth: (cm) 0.2 Volume: (cm)  0.804 Character of Wound/Ulcer Post Debridement: Stable Severity of Tissue Post Debridement: Fat layer exposed Post Procedure Diagnosis Same as Pre-procedure Electronic Signature(s) Signed: 04/06/2022 12:42:30 PM By: Kalman Shan DO Signed: 04/06/2022 4:18:56 PM By: Levora Dredge Entered By: Levora Dredge on 04/06/2022 12:27:30 Heather Dear A. ([id number]) -------------------------------------------------------------------------------- HPI Details Patient Name: Heather Dear A. Date of Service: 04/06/2022 11:15 AM Medical Record Number: [id number] Patient Account Number: [id number] Date of Birth/Sex: 1976-02-10 (46 y.o. F) Treating RN: Cornell Barman Primary Care Provider: Raelene Bott Other Clinician: Referring Provider: Raelene Bott Treating Provider/Extender: Yaakov Guthrie in Treatment: 3 History  of Present Illness HPI Description: 01/18/18-She is here for initial evaluation of the left great toe ulcer. She is a poor historian in regards to timeframe in detail. She states approximately 4 weeks ago she lacerated her toe on something in the house. She followed up with her primary care who placed her on Bactrim and ultimately a second dose of Bactrim prior to coming to wound clinic. She states she has been treating the toe with peroxide, Betadine and a Band-Aid. She did not check her blood sugar this morning but checked it yesterday morning it was 327; she is unaware of a recent A1c and there are no current records. She saw Dr. she would've orthopedics last week for an old injury to the left ankle, she states he did not see her toe, nor did she bring it to his attention. She smokes approximately 1 pack cigarettes a day. Her social situation is concerning, she arrives this morning with her mother who appears extremely intoxicated/under the influence; her mother was asked to leave the room and be monitored by the patient's grandmother. The patient's aunt then accompanied the  patient and the room throughout the rest of the appointment. We had a lengthy discussion regarding the deleterious effects of uncontrolled hyperglycemia and smoking as it relates to wound healing and overall health. She was strongly encouraged to decrease her smoking and get her diabetes under better control. She states she is currently on a diet and has cut down her [facility] consumption. The left toe is erythematous, macerated and slightly edematous with malodor present. The edema in her left foot is below her baseline, there is no erythema streaking. We will treat her with Santyl, doxycycline; we have ordered and xray, culture and provided a Peg assist surgical shoe and cultured the wound. 01/25/18-She is here in follow-up evaluation for a left great toe ulcer and presents with an abscess to her suprapubic area. She states her blood sugars remain elevated, feeling "sick" and if levels are below 250, but she is trying. She has made no attempt to decrease her smoking stating that we "can't take away her food in her cigarettes". She has been compliant with offloading using the PEG assist you. She is using Santyl daily. the culture obtained last week grew staph aureus and Enterococcus faecalis; continues on the doxycycline and Augmentin was added on Monday. The suprapubic area has erythema, no femoral variation, purple discoloration, minimal induration, was accessed with a cotton tip applicator with sanguinopurulent drainage, this was cultured, I suspect the current antibiotic treatment will cover and we will not add anything to her current treatment plan. She was advised to go to urgent care or ER with Heather change in redness, induration or fever. 02/01/18-She is here in follow-up evaluation for left great toe ulcers and a new abdominal abscess from last week. She was able to use packing until earlier this week, where she "forgot it was there". She states she was feeling ill with GI symptoms last week  and was not taking her antibiotic. She states her glucose levels have been predominantly less than 200, with occasional levels between 200-250. She thinks this was contributing to her GI symptoms as they have resolved without intervention. There continues to be significant laceration to left toe, otherwise it clinically looks stable/improved. There is now less superficial opening to the lateral aspect of the great toe that was residual blister. We will transition to [facility] to all wounds, she will continue her Augmentin. If there is no  change or deterioration next week for reculture. 02/08/18-She is here in follow-up evaluation for left great toe ulcer and abdominal ulcer. There is an improvement in both wounds. She has been wrapping her left toe with coban, not by our direction, which has created an area of discoloration to the medial aspect; she has been advised to NOT use coban secondary to her neuropathy. She states her glucose levels have been high over this last week ranging from 200-350, she continues to smoke. She admits to being less compliant with her offloading shoe. We will continue with same treatment plan and she will follow-up next week. 02/15/18-She is here in follow-up evaluation for left great toe ulcer and abdominal ulcer. The abdominal ulcer is epithelialized. The left great toe ulcer is improved and all injury from last week using the Coban wrap is resolved, the lateral ulcer is healed. She admits to noncompliance with wearing offloading shoe and admits to glucose levels being greater than 300 most of the week. She continues to smoke and expresses no desire to quit. There is one area medially that probes deeper than it has historically, erythema to the toe and dorsal foot has consistently waxed and waned. There is no overt signs of cellulitis or infection but we will culture the wound for Heather occult infection given the new area of depth and erythema. We will hold off on  sensitivities for initiation of antibiotic therapy. 02/22/18-She is here in follow up evaluation for left great toe ulcer. There is overall significant improvement in both wound appearance, erythema and edema with changes made last week. She was not initiated on antibiotic therapy. Culture obtained last week showed oxacillin sensitive staph aureus, sensitive to clindamycin. Clindamycin has been called into the pharmacy but she has been instructed to hold off on initiation secondary to overall clinical improvement and her history of antibiotic intolerance. She has been instructed to contact the clinic with Heather noted changes/deterioration and the wound, erythema, edema and/or pain. She will follow-up next week. She continues to smoke and her glucose levels remain elevated >250; she admits to compliance with offloading shoe 03/01/18 on evaluation today patient appears to be doing fairly well in regard to her left first toe ulcer. She has been tolerating the dressing changes with the [facility] Dressing without complication and overall this has definitely showed signs of improvement according to records as well is what the patient tells me today. I'm very pleased in that regard. She is having no pain today 03/08/18 She is here for follow up evaluation of a left great toe ulcer. She remains non-compliant with glucose control and smoking cessation; glucose levels consistently >200. She states that she got new shoe inserts/peg assist. She admits to compliance with offloading. Since my last evaluation there is significant improvement. We will switch to prisma at this time and she will follow up next week. She is noted to be tachycardic at this appointment, heart rate 120s; she has a history of heart rate 70-130 according to our records. She admits to extreme agitation r/t personal issues; she was advised to monitor her heartrate and contact her physician if it does not return to a more normal range (<100).  She takes cardizem twice daily. 03/15/18-She is here in follow-up evaluation for left great toe ulcer. She remains noncompliant with glucose control and smoking cessation. She admits to compliance with wearing offloading shoe. The ulcer is improved/stable and we will continue with the same treatment plan and she will follow-up next week 03/22/18-She  is here for evaluation for left great toe ulcer. There continues to be significant improvement despite recurrent hyperglycemia (over 500 yesterday) and she continues to smoke. She has been compliant with offloading and we will continue with same treatment plan and she will follow-up next week. 03/29/18-She is here for evaluation for left great toe ulcer. Despite continuing to smoke and uncontrolled diabetes she continues to improve. She is compliant with offloading shoe. We will continue with the same treatment plan and she will follow-up next week 04/05/18- She is here in follow up evaluation for a left great toe ulcer; she presents with small pustule to left fifth toe (resembles ant bite). She admits to compliance with wearing offloading shoe; continues to smoke or have uncontrolled blood glucose control. There is more callus than usual with evidence of bleeding; she denies known trauma. 04/12/18-She is here for evaluation of left great toe ulcer. Despite noncompliance with glycemic control and smoking she continues to make Cabell, Melani A. ([id number]) improvement. She continues to wear offloading shoe. The pustule, that was identified last week, to the left fifth toe is resolved. She will follow-up in 2 weeks 05/03/18-she is seen in follow-up evaluation for a left great toe ulcer. She is compliant with offloading, otherwise noncompliant with glycemic control and smoking. She has plateaued and there is minimal improvement noted. We will transition to [facility], replaced the insert to her surgical shoe and she will follow-up in one week 05/10/18- She  is here in follow up evaluation for a left great toe ulcer. It appears stable despite measurement change. We will continue with same treatment plan and follow up next week. 05/24/18-She is seen in follow-up evaluation for a left great toe ulcer. She remains compliant with offloading, has made significant improvement in her diet, decreasing the amount of sugar/soda. She said her recent A1c was 10.9 which is lower than. She did see a diabetic nutritionist/educator yesterday. She continues to smoke. We will continue with the same treatment plan and she'll follow-up next week. 05/31/18- She is seen in follow-up evaluation for left great toe ulcer. She continues to remain compliant with offloading, continues to make improvement in her diet, increasing her water and decreasing the amount of sugar/soda. She does continue to smoke with no desire to quit. We will apply Prisma to the depth and Hydrofera Blue over. We have not received insurance authorization for oasis. She will follow up next week. 06/07/18-She is seen in follow-up evaluation for left great toe ulcer. It has stalled according to today's measurements although base appears stable. She says she saw a diabetic educator yesterday; her average blood sugars are less than 300 which is an improvement for her. She continues to smoke and states "that's my next step" She continues with water over soda. We will order for xray, culture and reinstate ace wrap compression prior to placing apligraf for next week. She is voicing no complaints or concerns. Her dressing will change to iodoflex over the next week in preparation for apligraf. 06/14/18-She is seen in follow-up evaluation for left great toe ulcer. Plain film x-ray performed last week was negative for osteomyelitis. Wound culture obtained last week grew strep B and OSSA; she is initiated on keflex and cefdinir today; there is erythema to the toe which could be from ace wrap compression, she has a history of  wrapping too tight and has has been encouraged to maintain ace wraps that we place today. We will hold off on application of apligraf today, will apply  next week after antibiotic therapy has been initiated. She admits today that she has resumed taking a shower with her foot/toe submerged in water, she has been reminded to keep foot/toe out of the bath water. She will be seen in follow up next week 06/21/18-she is seen in follow-up evaluation for left great toe ulcer. She is tolerating antibiotic therapy with no GI disturbance. The wound is stable. Apligraf was applied today. She has been decreasing her smoking, only had 4 cigarettes yesterday and 1 today. She continues being more compliant in diabetic diet. She will follow-up next week for evaluation of site, if stable will remove at 2 weeks. 06/28/18- She is here in follow up evalution. Apligraf was placed last week, she states the dressing fell off on Tuesday and she was dressing with hydrofera blue. She is healed and will be discharged from the clinic today. She has been instructed to continue with smoking cessation, continue monitoring glucose levels, offloading for an additional 4 weeks and continue with hydrofera blue for additional two weeks for Heather possible microscopic opening. Readmission: 08/07/18 on evaluation today patient presents for reevaluation concerning the ulcer of her right great toe. She was previously discharged on 06/28/18 healed. Nonetheless she states that this began to show signs of drainage she subsequently went to her primary care provider. Subsequently an x-ray was performed on 08/01/18 which was negative. The patient was also placed on antibiotics at that time. Fortunately they should have been effective for the infection. Nonetheless she's been experiencing some improvement but still has a lot of drainage coming from the wound itself. 08/14/18 on evaluation today patient's wound actually does show signs of improvement in  regard to the erythema at this point. She has completed the antibiotics. With that being said we did discuss the possibility of placing her in a total contact cast as of today although I think that I may want to give this just a little bit more time to ensure nothing recurrence as far as her infection is concerned. I do not want to put in the cast and risk infection at that time if things are not completely resolved. With that being said she is gonna require some debridement today. 08/21/18 on evaluation today patient actually appears to be doing okay in regard to her toe ulcer. She's been tolerating the dressing changes without complication. With that being said it does appear that she is ready and in fact I think it's appropriate for [place] to go ahead and initiate the total contact cast today. Nonetheless she will require some sharp debridement to prepare the wound for application. Overall I feel like things have been progressing well but we do need to do something to get this to close more readily. 08/24/18 patient seen today for reevaluation after having had the total contact cast applied on Tuesday. She seems to have done very well the wound appears to be doing great and overall I'm pleased with the progress that she's made. There were no abnormal areas of rubbing from the cast on her lower extremity. 08/30/18 on evaluation today patient actually appears to be completely healed in regard to her plantar toe ulcer. She tells me at this point she's been having a lot of issues with the cast. She almost fell a couple of times the state shall the step of her dog a couple times as well. This is been a very frustrating process for her other nonetheless she has completely healed the wound which is excellent news. Overall there  does not appear to be the evidence of infection at this time which is great news. 09/11/18 evaluation today patient presents for follow-up concerning her great toe ulcer on the left  which has unfortunately reopened since I last saw her which was only a couple of weeks ago. Unfortunately she was not able to get in to get the shoe and potentially the AFO that's gonna be necessary due to her left foot drop. She continues with offloading shoe but this is not enough to prevent her from reopening it appears. When we last had her in the total contact cast she did well from a healing standpoint but unfortunately the wound reopened as soon as she came out of the cast within just a couple of weeks. 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 This is also part of the reason why she falls on a regular basis. I just do not believe that is gonna be safe for [place] to reinitiate the total contact cast as last time we had this on she fell 3 times one week which is definitely not normal for her. 09/18/18 upon evaluation today the patient actually appears to be doing about the same in regard to her toe ulcer. She did not contact [facility] as I asked her to even though I had given her the prescription. In fact she actually states that she has no idea where the prescription is. She did apparently call [facility] and they told her that all she needed to do was bring the prescription in order to be able to be seen and work on getting the AFO for her left foot. With all that being said she still does not have an appointment and I'm not sure were things stand that regard. I will give her a new prescription today in order to contact them to get this set up. 09/25/18 on evaluation today patient actually appears to be doing about the same in regard to her toes ulcer. She does have a small areas which seems to have a lot of callous buildup around the edge of the wound which is going to need sharp debridement today. She  still is waiting to be scheduled for evaluation with [employer] for possibility of an AFO. She states there supposed to call her tomorrow to get this set up. Unfortunately it does appear that her foot specifically the toe area is showing signs of erythema. There does not appear to be Heather systemic infection which is in these good news. [name], [name] ([id number]) 10/02/18 on evaluation today patient actually appears to be doing about the same in regard to her toe ulcer. This really has not done too well although it's not significantly larger it's also not significantly smaller. She has been tolerating the dressing changes without complication. She actually has her appointment with [facility] and [facility] tomorrow to hopefully be measured for obtaining and AFO splint. I think this would be helpful preventing this from reoccurring. We had contemplated starting the cast this week although to be honest I am reluctant to do that as she's been having nausea, vomiting, and seizure activity over the past three days. She has a history of seizures and have been told is nothing that can be done for these. With that being said I do believe that  along with the seizures have the nausea vomiting which upon further questioning doesn't seem to be the normal for her and makes me concerned for the possibility of infection or something else going on. I discussed this with the patient and her mother during the office visit today. I do not feel the wound is effective but maybe something else. The responses this was "this just happens to her at times and we don't know why". They did not seem to be interested in going to the hospital to have this checked out further. 10/09/18 on evaluation today patient presents for follow-up concerning her ongoing toe ulcer. She has been tolerating the dressing changes without complication. Fortunately there does not appear to be Heather evidence of infection which is great news however I do think  that the patient would benefit from going ahead for with the total contact cast. She's actually in a wheelchair today she tells me that she will use her walker if we initiate the cast. I was very specific about the fact that if we were gonna do the cast I wanted to make sure that she was using the walker in order to prevent Heather falls. She tells me she does not have stairs that she has to traverse on a regular basis at her home. She has not had Heather seizures since last week again that something that happens to her often she tells me she did talk to [employer] and they said that it may take up to three weeks to get the brace approved for her. Hopefully that will not take that long but nonetheless in the meantime I do think the cast could be of benefit. 10/12/18 on evaluation today patient appears to be doing rather well in regard to her toe ulcer. It's just been a few days and already this is significantly improved both as far as overall appearance and size. Fortunately there's no sign of infection. She is here for her first obligatory cast change. 10/19/18 Seen today for follow up and management of left great toe ulcer. Wound continues to show improvement. Noted small open area with seroussang drainage with palpation. Denies Heather increased pain or recent fevers during visit. She will continue calcium alginate with offloading shoe. Denies Heather questions or concerns during visit. 10/26/18 on evaluation today patient appears to be doing about the same as when I last saw her in regard to her wound bed. Fortunately there does not appear to be Heather signs of infection. Unfortunately she continues to have a breakdown in regard to the toe region Heather time that she is not in the cast. It takes almost no time at all for this to happen. Nonetheless she still has not heard anything from the brace being made by [employer] as to when exactly this will be available to her. Fortunately there is no signs of infection at this  time. 10/30/18 on evaluation today patient presents for application of the total contact cast as we just received him this morning. Fortunately we are gonna be able to apply this to her today which is great news. She continues to have no significant pain which is good news. Overall I do feel like things have been improving while she was the cast is when she doesn't have a cast that things get worse. She still has not really heard anything from [facility] regarding her brace. 11/02/18 upon evaluation today patient's wound already appears to be doing significantly better which is good news. Fortunately there does not appear to be  Heather signs of infection also good news. Overall I do think the total contact cast as before is helping to heal this area unfortunately it's just not gonna likely keep the area closed and healed without her getting her brace at least. Again the foot drop is a significant issue for her. 11/09/18 on evaluation today patient appears to be doing excellent in regard to her toe ulcer which in fact is completely healed. Fortunately we finally got the situation squared away with the paperwork which was needed to proceed with getting her brace approved by Medicaid. I have filled that out unfortunately that information has been sent to the orthopedic office that I worked at 2 1/2 years ago and not tired Current wound care measures. Fortunately she seems to be doing very well at this time. 11/23/18 on evaluation today patient appears to be doing More Poorly Compared to Last Time I Saw Her. At [facility] She Had Completely Healed. Currently she is continuing to have issues with reopening. She states that she just found out that the brace was approved through Medicaid now she just has to go get measured in order to have this fitted for her and then made. Subsequently she does not have an appointment for this yet that is going to complicate things we obviously cannot put her back in the cast if we do  not have everything measured because they're not gonna be able to measure her foot while she is in the cast. 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 Obviously this is unfortunate and does have me somewhat worried as well. 11/30/18 on evaluation today patient's toe ulcer actually appears to be doing fairly well. The good news is she will be getting her brace in the shoes next week on Wednesday. Hopefully we will be able to get this to heal without having to go back in the cast however she may need the cast in order to get the wound completely heal and then go from there. Fortunately there's no signs of infection at this time. 12/07/18 on evaluation today patient fortunately did receive her brace and she states she could tell this definitely makes her walk better. With that being said she's been having issues with her toe where she noticed yesterday there was a lot of tissue that was loosing off this appears to be much larger than what it was previous. She also states that her leg has been read putting much across the top of her foot just about the ankle although this seems to be receiving somewhat. The total area is still red and appears to be someone infected as best I can tell. She is previously taken Bactrim and that may be a good option for her today as well. We are gonna see what I wound culture shows as well and I think that this is definitely appropriate. With that being said outside of the culture I still need to initiate something in the interim and that's what I'm gonna go ahead and select Bactrim is a good option for her. 12/14/18 on evaluation today patient appears to be doing better in regard to her left great toe ulcer as compared to last week's evaluation. There's still some erythema although this is significantly improved which is excellent news. Overall I do believe that she is making good  progress is still gonna take some time before she is where  I would like her to be from the standpoint of being able to place her back into the total contact cast. Hopefully we will be where we need to be by next week. 12/21/18 on evaluation today patient actually appears to be doing poorly in regard to her toe ulcer. She's been tolerating the dressing changes without complication. Fortunately there's no signs of systemic infection although she does have a lot of drainage from the toe ulcer and this does seem to be causing some issues at this point. She does have erythema on the distal portion of her toe that appears to be likely cellulitis. 12/28/18 on evaluation today patient actually appears to be doing a little better in my pinion in regard to her toe ulcer. With that being said she still does have some evidence of infection at this time and for her culture she had both E. coli as well as enterococcus as organisms noted on evaluation. For that reason I think that though the Keflex likely has treated the E. coli rather well this has really done nothing for the enterococcus. We are going to have to initiate treatment for this specifically. ESTEPHANIE, HUBBS A. ([id number]) 01/04/19 on evaluation today patient's toe actually appears to be doing better from the standpoint of infection. She currently would like to see about putting the cash back on I think that this is appropriate as long as she takes care of it and keeps it from getting wet. She is gonna have some drainage we can definitely pass this up with Drawtex and alginate to try to prevent as much drainage as possible from causing the problems. With that being said I do want to at least try her with the cast between now and Tuesday. If there Heather issues we can't continue to use it then I will discontinue the use of the cast at that point. 01/08/19 on evaluation today patient actually appears to be doing very well as far as her foot ulcer specifically the  great toe on the left is concerned. She did have an area of rubbing on the medial aspect of her left ankle which again is from the cast. Fortunately there's no signs of infection at this point in this appears to be a very slight skin breakdown. The patient tells me she felt it rubbing but didn't think it was that bad. Fortunately there is no signs of active infection at this time which is good news. No fevers, chills, nausea, or vomiting noted at this time. 01/15/19 on evaluation today patient actually appears to be doing well in regard to her toe ulcer. Again as previous she seems to do well and she has the cast on which indicates to me that during the time she doesn't have a cast on she's putting way too much pressure on this region. Obviously I think that's gonna be an issue as with the current national emergency concerning the Covid-19 Virus it has been recommended that we discontinue the use of total contact casting by the chief medical officer of our company, Dr. Simona Huh. The reasoning is that if a patient becomes sick and cannot come into have the cast removed they could not just leave this on for an additional two weeks. Obviously the hospitals also do not want to receive patient's who are sick into the emergency department to potentially contaminate the region and spread the Covid-19 Virus among other sick individuals within the hospital system. Therefore at this point we are suspending the use of total contact cast  until the current emergency subsides. This was all discussed with the patient today as well. 01/22/19 on evaluation today patient's wound on her left great toe appears to be doing slightly worse than previously noted last week. She tells me that she has been on this quite a bit in fact she tells me she's been awake for 38 straight hours. This is due to the fact that she's having to care for grandparents because nobody else will. She has been taking care of them for five the last seven  days since I've seen her they both have dementia his is from a stroke and her grandmother's was progressive. Nonetheless she states even her mom who knows her condition and situation has only help two of those days to take care of them she's been taking care of the rest. Fortunately there does not appear to be Heather signs of active infection in regard to her toe at this point although obviously it doesn't look as good as it did previous. I think this is directly related to her not taking off the pressure and friction by way of taking things easy. Though I completely understand what's going on. 01/29/19 on evaluation today patient's tools are actually appears to be showing some signs of improvement today compared to last week's evaluation as far as not necessarily the overall size of the wound but the fact that she has some new skin growth in between the two ends of the wound opening. Overall I feel like she has done well she states that she had a family member give her what sounds to be a CAM walker boot which has been helpful as well. 02/05/19 on evaluation today patient's wound bed actually appears to be doing significantly better in regard to her overall appearance of the size of the wound. With that being said she is still having an issue with offloading efficiently enough to get this to close. Apparently there is some signs of infection at this point as well unfortunately. Previously she's done well of Augmentin I really do not see anything that needs to be culture currently but there theme and cellulitis of the foot that I'm seeing I'm gonna go ahead and place her on an antibiotic today to try to help clear this up. 02/12/2019 on evaluation today patient actually appears to be doing poorly in regard to her overall wound status. She tells me she has been using her offloading shoe but actually comes in today wearing her tennis shoe with the AFO brace. Again as I previously discussed with her this is  really not sufficient to allow the area to heal appropriately. Nonetheless she continues to be somewhat noncompliant and I do wonder based on what she has told my nurse in the past as to whether or not a good portion of this noncompliance may be recreational drug and alcohol related. She has had a history of heroin overdose and this was fairly recently in the past couple of months that have been seeing her. Nonetheless overall I feel like her wound looks significantly worse today compared to what it was previous. She still has significant erythema despite the Augmentin I am not sure that this is an appropriate medication for her infection I am also concerned that the infection may have gone down into her bone. 02/19/19 on evaluation today patient actually appears to be doing about the same in regard to her toe ulcer. Unfortunately she continues to show signs of bone exposure and infection at this point. There  does not appear to be Heather evidence of worsening of the infection but I'm also not really sure that it's getting significantly better. She is on the Augmentin which should be sufficient for the Staphylococcus aureus infection that she has at this point. With that being said she may need IV antibiotics to more appropriately treat this. We did have a discussion today about hyperbaric option therapy. 02/28/19 on evaluation today patient actually appears to be doing much worse in regard to the wound on her left great toe as compared to even my previous evaluation last week. Unfortunately this seems to be training in a pretty poor direction. Her toe was actually now starting to angle laterally and I can actually see the entire joint area of the proximal portion of the digit where is the distal portion of the digit again is no longer even in contact with the joint line. Unfortunately there's a lot more necrotic tissue around the edge and the toe appears to be showing signs of becoming gangrenous in my  pinion. I'm very concerned about were things stand at this point. She did see infectious disease and they are planning to send in a prescription for Sivextro for her and apparently this has been approved. With that being said I don't think she should avoid taking this but at the same time I'm not sure that it's gonna be sufficient to save her toe at this point. 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 03/05/19 on evaluation today patient actually appears to be doing quite well in regard to her toe all things considering. She still has bone exposed but there appears to be much less your thing on overall the appearance of the wound and the toe itself is dramatically improved. She still does have some issues currently obviously with infection she did see vascular as well and there concerned that her blood flow to the toad. For that reason they are setting up for an angiogram next week. 03/14/19 on evaluation today patient appears to be doing very poor in regard to her toe and specifically in regard to the ulceration and the fact that she's starting to notice the toe was leaning even more towards the lateral aspect and the complete joint is visible on the proximal aspect of the joint. Nonetheless she's also noted a significant odor and the tip of the toe is turning more dark and necrotic appearing. Overall I think she is getting worse not better as far as this is concerned. For that reason I am recommending at this point that she likely needs to be seen for likely amputation. READMISSION 03/19/2021 This is a patient that we cared for in this clinic for a prolonged period of time in 2019 and 2020 with a left foot and left first toe wound. I believe she ultimately became infected and underwent a left  first toe amputation. 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 ([id number]) 04/09/20 by Dr. Luana Shu. In December 2021 she had an ulcer on her right great toe as well as the fourth and fifth toes. 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 She also had an angiogram at that time and underwent angioplasty of the right anterior tibial artery.  In Heather case she claims that the wound on the right foot is closed I did not look at this today which was probably an oversight although I think that should be done next week. After her surgery she developed a dehiscence but I do not see Heather follow-up of this. According to Dr. Deborra Medina last review that she was out of the area being cared for by another physician but recently came back to his attention. 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 A culture of this area showed E. coli apparently before she came back to see Dr. Luana Shu she was supposed to be receiving antibiotics but she did not really take them. Nor is she offloading this area at all. Finally her last hemoglobin A1c listed in epic was in March 2022 at 14.1 she says things are a lot better since then although I am not sure. 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 She had this wound at the time. During this admission she had E. coli sepsis a TEE was negative. 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 She is still a heavy cigarette smoker. She has a history of bipolar disorder seizure disorder ABI in our clinic was 1.05 6/1; left midfoot in the setting of a TMA done previously. Round  circular wound with a "knuckle" of protruding tissue. The problem is that the knuckle was not attached to Heather of the surrounding granulation and this probed proximally widely I removed a large portion of this tissue. This wound goes with considerable undermining laterally. I do not feel Heather bone there was no purulence but this is a deep wound. 6/8; in spite of the debridement I did last week. She arrives with a wound looking exactly the same. A protruding "knuckle" of tissue nonadherent to most of the surrounding tissue. There is considerable depth around this from 6-12 o'clock at 2.7 cm and undermining of 1 cm. This does not look overtly infected and the x-ray I did last week was negative for Heather osseous abnormalities. We have been using silver collagen 6/15; deep tissue culture I did last week showed moderate staph aureus and moderate Pseudomonas. This will definitely require prolonged antibiotic therapy. The pathology on the protuberant area was negative for malignancy fungus etc. 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 We have been using silver collagen. I am going to be prescribing Levaquin for 2 weeks. Her CT scan of the foot is down for 7/5 6/22; CT scan of the foot on 7 5. She says she has hardware in the left leg from her previous fracture. She is on the Levaquin for the deep tissue culture I did that showed methicillin sensitive staph aureus and Pseudomonas. I gave her a 2-week supply and she will have another week. She arrives in clinic today with the same protuberant tissue however this is nonadherent to the tissue surrounding it. I am really at a loss to explain this unless there is underlying deep tissue infection 6/29; patient presents for 1 week follow-up. She has been using collagen to the wound bed. She reports taking her antibiotics as prescribed.She has no complaints or issues today. She denies signs of infection. 7/6;  patient presents for one week followup. She has been using collagen to the wound bed. She states she is taking Levaquin however at times she is not able to keep it down. She denies signs of infection. 7/13; patient presents for 1 week follow-up. She has been using silver alginate to the wound bed. She still has nausea when taking her antibiotics. She denies signs of infection. 7/20; patient presents for 1 week follow-up. She has been using silver alginate with gentamicin cream to the wound bed. She denies Heather issues and has no complaints today. She denies signs of infection. 7/27; patient presents for 1 week follow-up. She continues to use silver alginate with gentamicin cream to the wound bed. She reports starting her antibiotics. She has no issues or complaints. Overall she reports stability to the wound. 8/3; patient presents for 1 week follow-up. She has been using silver alginate with gentamicin cream to the wound bed. She reports completing all antibiotics. She has no issues or complaints today. She denies signs of infection. 8/17; patient presents for 2-week follow-up. He is to use silver alginate to the wound bed. She has no issues or complaints today. She denies signs of infection. She reports her pain has improved in her foot since last clinic visit 8/24; patient presents for 1 week follow-up. She continues to use silver alginate to the wound bed. She has no issues or complaints. She denies signs of infection. Pain is stable. 9/7; patient presents for follow-up. She missed her last week appointment due to feeling ill. She continues to use silver alginate. She has a new wound to the right lower extremity that is covered in eschar. She states It occurred over the past week and has no idea how it started. She currently denies signs of infection. 9/14; patient presents for follow-up. To the left foot wound she has been using gentamicin cream and silver alginate. To the right lower  extremity wound she has been keeping this covered and has not obtain Santyl. 9/21; patient presents for follow-up. She reports using gentamicin cream and silver alginate to the left foot and Santyl to the right lower extremity wound. She has no issues or complaints today. She denies signs of infection. 9/28; patient presents for follow-up. She reports a new wound to her right heel. She states this occurred a few days ago and is progressively gotten worse. She has been trying to clean the area with a Q-tip and Santyl. She reports stability in the other 2 wounds. She has been using gentamicin cream and silver alginate to the left foot and Santyl to the right lower extremity wound. 10/12; patient presents for follow-up. She reports improvement to the wound beds. She is seeing vein and vascular to discuss the potential of a left BKA. She states they are going to do an arteriogram. She continues to use silver alginate with dressing changes to her wounds. 11/2; patient presents for follow-up. She states she has not been doing dressing changes to the wound beds. She states she is not able to offload the areas. She reports chronic pain to her left foot wound. 11/9; patient presents for follow-up. She came in with only socks on. She states she forgot to put on shoes. It is unclear if she is doing Heather dressing changes. She currently denies systemic signs of infection. 11/16; patient presents for follow-up. She came again only with socks on. She states she does not wear shoes ever. It is unclear if she does dressing changes. She currently denies  systemic signs of infection. 11/23; patient presents for follow-up. She wore her shoes today. It still unclear exactly what dressing she is using for each wound but she did states she obtained Dakin's solution and has been using this to the left foot wound. She currently denies signs of infection. 11/30; patient presents for follow-up. She has no issues or complaints  today. She currently denies signs of infection. 12/7; patient presents for follow-up. She has no issues or complaints today. She has been using Hydrofera Blue to the right heel wound and Dakin solution to the left foot wound. Her right anterior leg wound is healed. She currently denies signs of infection. [name], [name] ([id number]) 12/14; patient presents for follow-up. She has been using Hydrofera Blue to the right heel and Dakin's to the left foot wounds. She has no issues or complaints today. She denies signs of infection. 12/21; patient presents for follow-up. She reports using Hydrofera Blue to the right heel and Dakin's to the left foot wound. She denies signs of infection. 12/28; patient presents for follow-up. She continues to use Dakin's to the left foot wound and Hydrofera Blue to the right heel wound. She denies signs of infection. 1/4; patient presents for follow-up. She has no issues or complaints today. She denies signs of infection. 1/11; patient presents for follow-up. It is unclear if she has been dressing these wounds over the past week. She currently denies signs of infection. 1/18; patient presents for follow-up. She states she has been using Dakin's wet-to-dry dressings to the left foot. She has been using Hydrofera Blue to the right foot foot wound. She states that the anterior right leg wound has reopened and draining serous fluid. She denies signs of infection. 1/25; patient presents for follow-up. She has no issues or complaints today. 2/1; patient presents for follow-up. She has no issues or complaints today. She denies signs of infection. 2/8; patient presents for follow-up. She has lost her surgical shoes. She did not have a dressing to the right heel wound. She currently denies signs of infection. 2/15; patient presents for follow-up. She reports more pain to the right heel today. She denies purulent drainage Or fever/chills 2/22; patient presents for follow-up.  She reports taking clindamycin over the past week. She states that she continues to have pain to her right heel. She reports purulent drainage. Readmission 03/16/2022 Ms. Dalaina Tates is a 46 year old female with a past medical history of type 2 diabetes, osteomyelitis to her feet, chronic systolic heart failure and bipolar disorder that presents to the clinic for bilateral feet wounds and right lower extremity wound. She was last seen in our clinic on 12/15/2021. At that time she had purulent drainage coming out of her right plantar foot and I recommended she go to the ED. She states she went to [facility] and has been there for the past 3 months. I cannot see the records. She states she had OR debridement and was on several weeks of IV antibiotics while inpatient. Since discharge she has not been taking care of the wound beds. She had nothing on her feet other than socks today. She currently denies signs of infection. 5/31; patient presents for follow-up. She has been using Dakin's wet-to-dry dressings to the wound beds on her feet bilaterally and antibiotic ointment to the right anterior leg wound. She had a wound culture done at last clinic visit that showed moderate Pseudomonas aeruginosa sensitive to ciprofloxacin. She currently denies systemic signs of infection. 6/14; patient presents  for follow-up. She received Keystone 5 days ago and has been using this on the wound beds. She states that last week she had to go to the hospital because she had increased warmth and erythema to the right foot. She was started on 2 oral antibiotics. She states she has been taking these. She currently denies systemic signs of infection. She has no issues or complaints today. Electronic Signature(s) Signed: 04/06/2022 12:42:30 PM By: Kalman Shan DO Entered By: Kalman Shan on 04/06/2022 12:35:38 [name]  ([id number]) -------------------------------------------------------------------------------- Physical Exam Details Patient Name: Heather Dear A. Date of Service: 04/06/2022 11:15 AM Medical Record Number: [id number] Patient Account Number: [id number] Date of Birth/Sex: [birth year] (46 y.o. F) Treating RN: Cornell Barman Primary Care Provider: Raelene Bott Other Clinician: Massie Kluver Referring Provider: Raelene Bott Treating Provider/Extender: Yaakov Guthrie in Treatment: 3 Constitutional . Cardiovascular . Psychiatric . Notes Right lower extremity: Epithelization to the previous wound site. To the right plantar heel there is an incision site with increased depth throughout. No probing to bone. Nonviable surface with some granulation tissue at the opening. Left Foot: To the medial aspect there is an open wound with granulation tissue and nonviable tissue present With circumferential callus. Electronic Signature(s) Signed: 04/06/2022 12:42:30 PM By: Kalman Shan DO Entered By: Kalman Shan on 04/06/2022 12:37:41 [name] ([id number]) -------------------------------------------------------------------------------- Physician Orders Details Patient Name: Heather Dear A. Date of Service: 04/06/2022 11:15 AM Medical Record Number: [id number] Patient Account Number: [id number] Date of Birth/Sex: May 31, 1976 (46 y.o. F) Treating RN: Levora Dredge Primary Care Provider: Raelene Bott Other Clinician: Referring Provider: Raelene Bott Treating Provider/Extender: Yaakov Guthrie in Treatment: 3 Verbal / Phone Orders: No Diagnosis Coding Follow-up Appointments o Return Appointment in 1 week. Bathing/ Shower/ Hygiene o Wash wounds with antibacterial soap and water. o No tub bath. Anesthetic (Use 'Patient Medications' Section for Anesthetic Order Entry) o Lidocaine applied to wound bed Edema Control - Lymphedema / Segmental Compressive Device  / Other o Elevate, Exercise Daily and Avoid Standing for Long Periods of Time. o Elevate legs to the level of the heart and pump ankles as often as possible o Elevate leg(s) parallel to the floor when sitting. Off-Loading o Other: - keep pressure off of feet. Wound Treatment Wound #11 - Foot Wound Laterality: Plantar, Right Cleanser: Soap and Water 2 x Per Day/30 Days Discharge Instructions: Gently cleanse wound with antibacterial soap, rinse and pat dry prior to dressing wounds Primary Dressing: Gauze 2 x Per Day/30 Days Discharge Instructions: As directed: moistened with Dakins Solution Secondary Dressing: ABD Pad 5x9 (in/in) 2 x Per Day/30 Days Discharge Instructions: Cover with ABD pad Secured With: Medipore Tape - 78M Medipore H Soft Cloth Surgical Tape, 2x2 (in/yd) 2 x Per Day/30 Days Secured With: Kerlix Roll Sterile or Non-Sterile 6-ply 4.5x4 (yd/yd) 2 x Per Day/30 Days Discharge Instructions: Apply Kerlix as directed Wound #12 - Foot Wound Laterality: Plantar, Left, Medial Cleanser: Soap and Water 2 x Per Day/30 Days Discharge Instructions: Gently cleanse wound with antibacterial soap, rinse and pat dry prior to dressing wounds Primary Dressing: Gauze 2 x Per Day/30 Days Discharge Instructions: As directed: moistened with Dakins Solution Secondary Dressing: ABD Pad 5x9 (in/in) 2 x Per Day/30 Days Discharge Instructions: Cover with ABD pad Secured With: Medipore Tape - 78M Medipore H Soft Cloth Surgical Tape, 2x2 (in/yd) 2 x Per Day/30 Days Secured With: Kerlix Roll Sterile or Non-Sterile 6-ply 4.5x4 (yd/yd) 2 x Per Day/30 Days Discharge Instructions: Apply Kerlix as directed  Electronic Signature(s) Signed: 04/06/2022 1:49:53 PM By: Kalman Shan DO Signed: 04/06/2022 4:18:56 PM By: Levora Angel, Brittnay [name] Kitchen ([id number]) Previous Signature: 04/06/2022 12:42:30 PM Version By: Kalman Shan DO Entered By: Levora Dredge on 04/06/2022 12:56:51 Liddicoat, Jaliya [name] Kitchen  ([id number]) -------------------------------------------------------------------------------- Problem List Details Patient Name: Heather Dear A. Date of Service: 04/06/2022 11:15 AM Medical Record Number: [id number] Patient Account Number: [id number] Date of Birth/Sex: 1976-10-07 (46 y.o. F) Treating RN: Cornell Barman Primary Care Provider: Raelene Bott Other Clinician: Referring Provider: Raelene Bott Treating Provider/Extender: Yaakov Guthrie in Treatment: 3 Active Problems ICD-10 Encounter Code Description 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/2022 No Yes severity L97.512 Non-pressure chronic ulcer of other part of right foot with fat layer 03/16/2022 No Yes exposed E11.621 Type 2 diabetes mellitus with foot ulcer 03/16/2022 No Yes E11.42 Type 2 diabetes mellitus with diabetic polyneuropathy 03/16/2022 No Yes L97.811 Non-pressure chronic ulcer of other part of right lower leg limited to 03/16/2022 No Yes breakdown of skin Inactive Problems Resolved Problems Electronic Signature(s) Signed: 04/06/2022 12:42:30 PM By: Kalman Shan DO Entered By: Kalman Shan on 04/06/2022 12:32:25 [name] ([id number]) -------------------------------------------------------------------------------- Progress Note Details Patient Name: Heather Dear A. Date of Service: 04/06/2022 11:15 AM Medical Record Number: [id number] Patient Account Number: [id number] Date of Birth/Sex: 1976-07-13 (46 y.o. F) Treating RN: Cornell Barman Primary Care Provider: Raelene Bott Other Clinician: Massie Kluver Referring Provider: Raelene Bott Treating Provider/Extender: Yaakov Guthrie in Treatment: 3 Subjective Chief Complaint Information obtained from Patient 03/19/2021; patient referred by Dr. Luana Shu who has been looking after her left foot for quite a period of time for review of a nonhealing area in  the left midfoot 03/12/2022; bilateral feet wounds and right lower extremity wound. History of Present Illness (HPI) 01/18/18-She is here for initial evaluation of the left great toe ulcer. She is a poor historian in regards to timeframe in detail. She states approximately 4 weeks ago she lacerated her toe on something in the house. She followed up with her primary care who placed her on Bactrim and ultimately a second dose of Bactrim prior to coming to wound clinic. She states she has been treating the toe with peroxide, Betadine and a Band-Aid. She did not check her blood sugar this morning but checked it yesterday morning it was 327; she is unaware of a recent A1c and there are no current records. She saw Dr. she would've orthopedics last week for an old injury to the left ankle, she states he did not see her toe, nor did she bring it to his attention. She smokes approximately 1 pack cigarettes a day. Her social situation is concerning, she arrives this morning with her mother who appears extremely intoxicated/under the influence; her mother was asked to leave the room and be monitored by the patient's grandmother. The patient's aunt then accompanied the patient and the room throughout the rest of the appointment. We had a lengthy discussion regarding the deleterious effects of uncontrolled hyperglycemia and smoking as it relates to wound healing and overall health. She was strongly encouraged to decrease her smoking and get her diabetes under better control. She states she is currently on a diet and has cut down her [facility] consumption. The left toe is erythematous, macerated and slightly edematous with malodor present. The edema in her left foot is below her baseline, there is no erythema streaking. We will treat her with Santyl, doxycycline; we have ordered  and xray, culture and provided a Peg assist surgical shoe and cultured the wound. 01/25/18-She is here in follow-up evaluation for a left  great toe ulcer and presents with an abscess to her suprapubic area. She states her blood sugars remain elevated, feeling "sick" and if levels are below 250, but she is trying. She has made no attempt to decrease her smoking stating that we "can't take away her food in her cigarettes". She has been compliant with offloading using the PEG assist you. She is using Santyl daily. the culture obtained last week grew staph aureus and Enterococcus faecalis; continues on the doxycycline and Augmentin was added on Monday. The suprapubic area has erythema, no femoral variation, purple discoloration, minimal induration, was accessed with a cotton tip applicator with sanguinopurulent drainage, this was cultured, I suspect the current antibiotic treatment will cover and we will not add anything to her current treatment plan. She was advised to go to urgent care or ER with Heather change in redness, induration or fever. 02/01/18-She is here in follow-up evaluation for left great toe ulcers and a new abdominal abscess from last week. She was able to use packing until earlier this week, where she "forgot it was there". She states she was feeling ill with GI symptoms last week and was not taking her antibiotic. She states her glucose levels have been predominantly less than 200, with occasional levels between 200-250. She thinks this was contributing to her GI symptoms as they have resolved without intervention. There continues to be significant laceration to left toe, otherwise it clinically looks stable/improved. There is now less superficial opening to the lateral aspect of the great toe that was residual blister. We will transition to [facility] to all wounds, she will continue her Augmentin. If there is no change or deterioration next week for reculture. 02/08/18-She is here in follow-up evaluation for left great toe ulcer and abdominal ulcer. There is an improvement in both wounds. She has been wrapping her left  toe with coban, not by our direction, which has created an area of discoloration to the medial aspect; she has been advised to NOT use coban secondary to her neuropathy. She states her glucose levels have been high over this last week ranging from 200-350, she continues to smoke. She admits to being less compliant with her offloading shoe. We will continue with same treatment plan and she will follow-up next week. 02/15/18-She is here in follow-up evaluation for left great toe ulcer and abdominal ulcer. The abdominal ulcer is epithelialized. The left great toe ulcer is improved and all injury from last week using the Coban wrap is resolved, the lateral ulcer is healed. She admits to noncompliance with wearing offloading shoe and admits to glucose levels being greater than 300 most of the week. She continues to smoke and expresses no desire to quit. There is one area medially that probes deeper than it has historically, erythema to the toe and dorsal foot has consistently waxed and waned. There is no overt signs of cellulitis or infection but we will culture the wound for Heather occult infection given the new area of depth and erythema. We will hold off on sensitivities for initiation of antibiotic therapy. 02/22/18-She is here in follow up evaluation for left great toe ulcer. There is overall significant improvement in both wound appearance, erythema and edema with changes made last week. She was not initiated on antibiotic therapy. Culture obtained last week showed oxacillin sensitive staph aureus, sensitive to clindamycin. Clindamycin has  been called into the pharmacy but she has been instructed to hold off on initiation secondary to overall clinical improvement and her history of antibiotic intolerance. She has been instructed to contact the clinic with Heather noted changes/deterioration and the wound, erythema, edema and/or pain. She will follow-up next week. She continues to smoke and her glucose  levels remain elevated >250; she admits to compliance with offloading shoe 03/01/18 on evaluation today patient appears to be doing fairly well in regard to her left first toe ulcer. She has been tolerating the dressing changes with the [facility] Dressing without complication and overall this has definitely showed signs of improvement according to records as well is what the patient tells me today. I'm very pleased in that regard. She is having no pain today 03/08/18 She is here for follow up evaluation of a left great toe ulcer. She remains non-compliant with glucose control and smoking cessation; glucose levels consistently >200. She states that she got new shoe inserts/peg assist. She admits to compliance with offloading. Since my last evaluation there is significant improvement. We will switch to prisma at this time and she will follow up next week. She is noted to be tachycardic at this appointment, heart rate 120s; she has a history of heart rate 70-130 according to our records. She admits to extreme agitation r/t personal issues; she was advised to monitor her heartrate and contact her physician if it does not return to a more normal range (<100). She takes cardizem twice daily. 03/15/18-She is here in follow-up evaluation for left great toe ulcer. She remains noncompliant with glucose control and smoking cessation. She admits to compliance with wearing offloading shoe. The ulcer is improved/stable and we will continue with the same treatment plan and she will follow-up next week 03/22/18-She is here for evaluation for left great toe ulcer. There continues to be significant improvement despite recurrent hyperglycemia (over 500 yesterday) and she continues to smoke. She has been compliant with offloading and we will continue with same treatment plan and she will follow-up next week. [name], [name] ([id number]) 03/29/18-She is here for evaluation for left great toe ulcer. Despite continuing to  smoke and uncontrolled diabetes she continues to improve. She is compliant with offloading shoe. We will continue with the same treatment plan and she will follow-up next week 04/05/18- She is here in follow up evaluation for a left great toe ulcer; she presents with small pustule to left fifth toe (resembles ant bite). She admits to compliance with wearing offloading shoe; continues to smoke or have uncontrolled blood glucose control. There is more callus than usual with evidence of bleeding; she denies known trauma. 04/12/18-She is here for evaluation of left great toe ulcer. Despite noncompliance with glycemic control and smoking she continues to make improvement. She continues to wear offloading shoe. The pustule, that was identified last week, to the left fifth toe is resolved. She will follow-up in 2 weeks 05/03/18-she is seen in follow-up evaluation for a left great toe ulcer. She is compliant with offloading, otherwise noncompliant with glycemic control and smoking. She has plateaued and there is minimal improvement noted. We will transition to [facility], replaced the insert to her surgical shoe and she will follow-up in one week 05/10/18- She is here in follow up evaluation for a left great toe ulcer. It appears stable despite measurement change. We will continue with same treatment plan and follow up next week. 05/24/18-She is seen in follow-up evaluation for a left great toe  ulcer. She remains compliant with offloading, has made significant improvement in her diet, decreasing the amount of sugar/soda. She said her recent A1c was 10.9 which is lower than. She did see a diabetic nutritionist/educator yesterday. She continues to smoke. We will continue with the same treatment plan and she'll follow-up next week. 05/31/18- She is seen in follow-up evaluation for left great toe ulcer. She continues to remain compliant with offloading, continues to make improvement in her diet, increasing her  water and decreasing the amount of sugar/soda. She does continue to smoke with no desire to quit. We will apply Prisma to the depth and Hydrofera Blue over. We have not received insurance authorization for oasis. She will follow up next week. 06/07/18-She is seen in follow-up evaluation for left great toe ulcer. It has stalled according to today's measurements although base appears stable. She says she saw a diabetic educator yesterday; her average blood sugars are less than 300 which is an improvement for her. She continues to smoke and states "that's my next step" She continues with water over soda. We will order for xray, culture and reinstate ace wrap compression prior to placing apligraf for next week. She is voicing no complaints or concerns. Her dressing will change to iodoflex over the next week in preparation for apligraf. 06/14/18-She is seen in follow-up evaluation for left great toe ulcer. Plain film x-ray performed last week was negative for osteomyelitis. Wound culture obtained last week grew strep B and OSSA; she is initiated on keflex and cefdinir today; there is erythema to the toe which could be from ace wrap compression, she has a history of wrapping too tight and has has been encouraged to maintain ace wraps that we place today. We will hold off on application of apligraf today, will apply next week after antibiotic therapy has been initiated. She admits today that she has resumed taking a shower with her foot/toe submerged in water, she has been reminded to keep foot/toe out of the bath water. She will be seen in follow up next week 06/21/18-she is seen in follow-up evaluation for left great toe ulcer. She is tolerating antibiotic therapy with no GI disturbance. The wound is stable. Apligraf was applied today. She has been decreasing her smoking, only had 4 cigarettes yesterday and 1 today. She continues being more compliant in diabetic diet. She will follow-up next week for  evaluation of site, if stable will remove at 2 weeks. 06/28/18- She is here in follow up evalution. Apligraf was placed last week, she states the dressing fell off on Tuesday and she was dressing with hydrofera blue. She is healed and will be discharged from the clinic today. She has been instructed to continue with smoking cessation, continue monitoring glucose levels, offloading for an additional 4 weeks and continue with hydrofera blue for additional two weeks for Heather possible microscopic opening. Readmission: 08/07/18 on evaluation today patient presents for reevaluation concerning the ulcer of her right great toe. She was previously discharged on 06/28/18 healed. Nonetheless she states that this began to show signs of drainage she subsequently went to her primary care provider. Subsequently an x-ray was performed on 08/01/18 which was negative. The patient was also placed on antibiotics at that time. Fortunately they should have been effective for the infection. Nonetheless she's been experiencing some improvement but still has a lot of drainage coming from the wound itself. 08/14/18 on evaluation today patient's wound actually does show signs of improvement in regard to the erythema at this  point. She has completed the antibiotics. With that being said we did discuss the possibility of placing her in a total contact cast as of today although I think that I may want to give this just a little bit more time to ensure nothing recurrence as far as her infection is concerned. I do not want to put in the cast and risk infection at that time if things are not completely resolved. With that being said she is gonna require some debridement today. 08/21/18 on evaluation today patient actually appears to be doing okay in regard to her toe ulcer. She's been tolerating the dressing changes without complication. With that being said it does appear that she is ready and in fact I think it's appropriate for [place] to  go ahead and initiate the total contact cast today. Nonetheless she will require some sharp debridement to prepare the wound for application. Overall I feel like things have been progressing well but we do need to do something to get this to close more readily. 08/24/18 patient seen today for reevaluation after having had the total contact cast applied on Tuesday. She seems to have done very well the wound appears to be doing great and overall I'm pleased with the progress that she's made. There were no abnormal areas of rubbing from the cast on her lower extremity. 08/30/18 on evaluation today patient actually appears to be completely healed in regard to her plantar toe ulcer. She tells me at this point she's been having a lot of issues with the cast. She almost fell a couple of times the state shall the step of her dog a couple times as well. This is been a very frustrating process for her other nonetheless she has completely healed the wound which is excellent news. Overall there does not appear to be the evidence of infection at this time which is great news. 09/11/18 evaluation today patient presents for follow-up concerning her great toe ulcer on the left which has unfortunately reopened since I last saw her which was only a couple of weeks ago. Unfortunately she was not able to get in to get the shoe and potentially the AFO that's gonna be necessary due to her left foot drop. She continues with offloading shoe but this is not enough to prevent her from reopening it appears. When we last had her in the total contact cast she did well from a healing standpoint but unfortunately the wound reopened as soon as she came out of the cast within just a couple of weeks. 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 This is also part of the reason why she falls on a regular basis. I just do not believe that is gonna be safe for [place] to reinitiate the total contact cast as last time we had this on she fell 3 times one week which is definitely not normal for her. 09/18/18 upon evaluation today the patient actually appears to be doing about the same in regard to her toe ulcer. She did not contact [employer] as I asked her to even though I had given her the prescription. In fact she actually states that she has no idea where the prescription is. She did apparently call [facility] and they told her that all she needed  to do was bring the prescription in order to be able to be seen and work on getting the AFO for her left foot. With all that being said she still does not have an appointment and I'm not sure were things stand that regard. I will give her a new prescription today in order to contact them to get this set up. [name], [name] ([id number]) 09/25/18 on evaluation today patient actually appears to be doing about the same in regard to her toes ulcer. She does have a small areas which seems to have a lot of callous buildup around the edge of the wound which is going to need sharp debridement today. She still is waiting to be scheduled for evaluation with [facility] for possibility of an AFO. She states there supposed to call her tomorrow to get this set up. Unfortunately it does appear that her foot specifically the toe area is showing signs of erythema. There does not appear to be Heather systemic infection which is in these good news. 10/02/18 on evaluation today patient actually appears to be doing about the same in regard to her toe ulcer. This really has not done too well although it's not significantly larger it's also not significantly smaller. She has been tolerating the dressing changes without complication. She actually has her appointment with [facility] and [facility] tomorrow to hopefully be measured for  obtaining and AFO splint. I think this would be helpful preventing this from reoccurring. We had contemplated starting the cast this week although to be honest I am reluctant to do that as she's been having nausea, vomiting, and seizure activity over the past three days. She has a history of seizures and have been told is nothing that can be done for these. With that being said I do believe that along with the seizures have the nausea vomiting which upon further questioning doesn't seem to be the normal for her and makes me concerned for the possibility of infection or something else going on. I discussed this with the patient and her mother during the office visit today. I do not feel the wound is effective but maybe something else. The responses this was "this just happens to her at times and we don't know why". They did not seem to be interested in going to the hospital to have this checked out further. 10/09/18 on evaluation today patient presents for follow-up concerning her ongoing toe ulcer. She has been tolerating the dressing changes without complication. Fortunately there does not appear to be Heather evidence of infection which is great news however I do think that the patient would benefit from going ahead for with the total contact cast. She's actually in a wheelchair today she tells me that she will use her walker if we initiate the cast. I was very specific about the fact that if we were gonna do the cast I wanted to make sure that she was using the walker in order to prevent Heather falls. She tells me she does not have stairs that she has to traverse on a regular basis at her home. She has not had Heather seizures since last week again that something that happens to her often she tells me she did talk to [employer] and they said that it may take up to three weeks to get the brace approved for her. Hopefully that will not take that long but nonetheless in the meantime I do think the cast could be  of benefit. 10/12/18 on evaluation today patient appears  to be doing rather well in regard to her toe ulcer. It's just been a few days and already this is significantly improved both as far as overall appearance and size. Fortunately there's no sign of infection. She is here for her first obligatory cast change. 10/19/18 Seen today for follow up and management of left great toe ulcer. Wound continues to show improvement. Noted small open area with seroussang drainage with palpation. Denies Heather increased pain or recent fevers during visit. She will continue calcium alginate with offloading shoe. Denies Heather questions or concerns during visit. 10/26/18 on evaluation today patient appears to be doing about the same as when I last saw her in regard to her wound bed. Fortunately there does not appear to be Heather signs of infection. Unfortunately she continues to have a breakdown in regard to the toe region Heather time that she is not in the cast. It takes almost no time at all for this to happen. Nonetheless she still has not heard anything from the brace being made by [facility] as to when exactly this will be available to her. Fortunately there is no signs of infection at this time. 10/30/18 on evaluation today patient presents for application of the total contact cast as we just received him this morning. Fortunately we are gonna be able to apply this to her today which is great news. She continues to have no significant pain which is good news. Overall I do feel like things have been improving while she was the cast is when she doesn't have a cast that things get worse. She still has not really heard anything from [facility] regarding her brace. 11/02/18 upon evaluation today patient's wound already appears to be doing significantly better which is good news. Fortunately there does not appear to be Heather signs of infection also good news. Overall I do think the total contact cast as before is helping to heal this area  unfortunately it's just not gonna likely keep the area closed and healed without her getting her brace at least. Again the foot drop is a significant issue for her. 11/09/18 on evaluation today patient appears to be doing excellent in regard to her toe ulcer which in fact is completely healed. Fortunately we finally got the situation squared away with the paperwork which was needed to proceed with getting her brace approved by Medicaid. I have filled that out unfortunately that information has been sent to the orthopedic office that I worked at 2 1/2 years ago and not tired Current wound care measures. Fortunately she seems to be doing very well at this time. 11/23/18 on evaluation today patient appears to be doing More Poorly Compared to Last Time I Saw Her. At [facility] She Had Completely Healed. Currently she is continuing to have issues with reopening. She states that she just found out that the brace was approved through Medicaid now she just has to go get measured in order to have this fitted for her and then made. Subsequently she does not have an appointment for this yet that is going to complicate things we obviously cannot put her back in the cast if we do not have everything measured because they're not gonna be able to measure her foot while she is in the cast. 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 Obviously this is unfortunate and does have me somewhat worried as well.  11/30/18 on evaluation today patient's toe ulcer actually appears to be doing fairly well. The good news is she will be getting her brace in the shoes next week on Wednesday. Hopefully we will be able to get this to heal without having to go back in the cast however she may need the cast in order to get the wound completely heal and then go from there. Fortunately there's no signs of infection at this time. 12/07/18 on  evaluation today patient fortunately did receive her brace and she states she could tell this definitely makes her walk better. With that being said she's been having issues with her toe where she noticed yesterday there was a lot of tissue that was loosing off this appears to be much larger than what it was previous. She also states that her leg has been read putting much across the top of her foot just about the ankle although this seems to be receiving somewhat. The total area is still red and appears to be someone infected as best I can tell. She is previously taken Bactrim and that may be a good option for her today as well. We are gonna see what I wound culture shows as well and I think that this is definitely appropriate. With that being said outside of the culture I still need to initiate something in the interim and that's what I'm gonna go ahead and select Bactrim is a good option for her. 12/14/18 on evaluation today patient appears to be doing better in regard to her left great toe ulcer as compared to last week's evaluation. There's still some erythema although this is significantly improved which is excellent news. Overall I do believe that she is making good progress is still gonna take some time before she is where I would like her to be from the standpoint of being able to place her back into the total contact cast. Hopefully we will be where we need to be by next week. 12/21/18 on evaluation today patient actually appears to be doing poorly in regard to her toe ulcer. She's been tolerating the dressing changes without complication. Fortunately there's no signs of systemic infection although she does have a lot of drainage from the toe ulcer and this does [name], [name] ([id number]) seem to be causing some issues at this point. She does have erythema on the distal portion of her toe that appears to be likely cellulitis. 12/28/18 on evaluation today patient actually appears to be doing a  little better in my pinion in regard to her toe ulcer. With that being said she still does have some evidence of infection at this time and for her culture she had both E. coli as well as enterococcus as organisms noted on evaluation. For that reason I think that though the Keflex likely has treated the E. coli rather well this has really done nothing for the enterococcus. We are going to have to initiate treatment for this specifically. 01/04/19 on evaluation today patient's toe actually appears to be doing better from the standpoint of infection. She currently would like to see about putting the cash back on I think that this is appropriate as long as she takes care of it and keeps it from getting wet. She is gonna have some drainage we can definitely pass this up with Drawtex and alginate to try to prevent as much drainage as possible from causing the problems. With that being said I do want to at least try her with  the cast between now and Tuesday. If there Heather issues we can't continue to use it then I will discontinue the use of the cast at that point. 01/08/19 on evaluation today patient actually appears to be doing very well as far as her foot ulcer specifically the great toe on the left is concerned. She did have an area of rubbing on the medial aspect of her left ankle which again is from the cast. Fortunately there's no signs of infection at this point in this appears to be a very slight skin breakdown. The patient tells me she felt it rubbing but didn't think it was that bad. Fortunately there is no signs of active infection at this time which is good news. No fevers, chills, nausea, or vomiting noted at this time. 01/15/19 on evaluation today patient actually appears to be doing well in regard to her toe ulcer. Again as previous she seems to do well and she has the cast on which indicates to me that during the time she doesn't have a cast on she's putting way too much pressure on this  region. Obviously I think that's gonna be an issue as with the current national emergency concerning the Covid-19 Virus it has been recommended that we discontinue the use of total contact casting by the chief medical officer of our company, Dr. Simona Huh. The reasoning is that if a patient becomes sick and cannot come into have the cast removed they could not just leave this on for an additional two weeks. Obviously the hospitals also do not want to receive patient's who are sick into the emergency department to potentially contaminate the region and spread the Covid-19 Virus among other sick individuals within the hospital system. Therefore at this point we are suspending the use of total contact cast until the current emergency subsides. This was all discussed with the patient today as well. 01/22/19 on evaluation today patient's wound on her left great toe appears to be doing slightly worse than previously noted last week. She tells me that she has been on this quite a bit in fact she tells me she's been awake for 38 straight hours. This is due to the fact that she's having to care for grandparents because nobody else will. She has been taking care of them for five the last seven days since I've seen her they both have dementia his is from a stroke and her grandmother's was progressive. Nonetheless she states even her mom who knows her condition and situation has only help two of those days to take care of them she's been taking care of the rest. Fortunately there does not appear to be Heather signs of active infection in regard to her toe at this point although obviously it doesn't look as good as it did previous. I think this is directly related to her not taking off the pressure and friction by way of taking things easy. Though I completely understand what's going on. 01/29/19 on evaluation today patient's tools are actually appears to be showing some signs of improvement today compared to last  week's evaluation as far as not necessarily the overall size of the wound but the fact that she has some new skin growth in between the two ends of the wound opening. Overall I feel like she has done well she states that she had a family member give her what sounds to be a CAM walker boot which has been helpful as well. 02/05/19 on evaluation today patient's wound bed actually  appears to be doing significantly better in regard to her overall appearance of the size of the wound. With that being said she is still having an issue with offloading efficiently enough to get this to close. Apparently there is some signs of infection at this point as well unfortunately. Previously she's done well of Augmentin I really do not see anything that needs to be culture currently but there theme and cellulitis of the foot that I'm seeing I'm gonna go ahead and place her on an antibiotic today to try to help clear this up. 02/12/2019 on evaluation today patient actually appears to be doing poorly in regard to her overall wound status. She tells me she has been using her offloading shoe but actually comes in today wearing her tennis shoe with the AFO brace. Again as I previously discussed with her this is really not sufficient to allow the area to heal appropriately. Nonetheless she continues to be somewhat noncompliant and I do wonder based on what she has told my nurse in the past as to whether or not a good portion of this noncompliance may be recreational drug and alcohol related. She has had a history of heroin overdose and this was fairly recently in the past couple of months that have been seeing her. Nonetheless overall I feel like her wound looks significantly worse today compared to what it was previous. She still has significant erythema despite the Augmentin I am not sure that this is an appropriate medication for her infection I am also concerned that the infection may have gone down into her bone. 02/19/19  on evaluation today patient actually appears to be doing about the same in regard to her toe ulcer. Unfortunately she continues to show signs of bone exposure and infection at this point. There does not appear to be Heather evidence of worsening of the infection but I'm also not really sure that it's getting significantly better. She is on the Augmentin which should be sufficient for the Staphylococcus aureus infection that she has at this point. With that being said she may need IV antibiotics to more appropriately treat this. We did have a discussion today about hyperbaric option therapy. 02/28/19 on evaluation today patient actually appears to be doing much worse in regard to the wound on her left great toe as compared to even my previous evaluation last week. Unfortunately this seems to be training in a pretty poor direction. Her toe was actually now starting to angle laterally and I can actually see the entire joint area of the proximal portion of the digit where is the distal portion of the digit again is no longer even in contact with the joint line. Unfortunately there's a lot more necrotic tissue around the edge and the toe appears to be showing signs of becoming gangrenous in my pinion. I'm very concerned about were things stand at this point. She did see infectious disease and they are planning to send in a prescription for Sivextro for her and apparently this has been approved. With that being said I don't think she should avoid taking this but at the same time I'm not sure that it's gonna be sufficient to save her toe at this point. 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 03/05/19 on evaluation  today patient actually appears to be doing quite well in regard to her toe all things considering.  She still has bone exposed but there appears to be much less your thing on overall the appearance of the wound and the toe itself is dramatically improved. She still does have some issues currently obviously with infection she did see vascular as well and there concerned that her blood flow to the toad. For that reason they are setting up for an angiogram next week. 03/14/19 on evaluation today patient appears to be doing very poor in regard to her toe and specifically in regard to the ulceration and the fact that she's starting to notice the toe was leaning even more towards the lateral aspect and the complete joint is visible on the proximal aspect of the joint. Nonetheless she's also noted a significant odor and the tip of the toe is turning more dark and necrotic appearing. Overall I think she is getting worse not better as far as this is concerned. For that reason I am recommending at this point that she likely needs to be seen for likely amputation. [name], NICHOLLS ([id number]) READMISSION 03/19/2021 This is a patient that we cared for in this clinic for a prolonged period of time in 2019 and 2020 with a left foot and left first toe wound. 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 Luana Shu. In December 2021 she had an ulcer on her right great toe as well as the fourth and fifth toes. 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 She also had an angiogram at that time and underwent angioplasty of the right anterior tibial artery. In Heather case she claims that the wound on the right foot is closed I did not look at this today which was probably an oversight although I think that should be done next week. After her surgery she developed a dehiscence but I do not see Heather follow-up of this. According to Dr. Deborra Medina last review that she was out of the  area being cared for by another physician but recently came back to his attention. 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 A culture of this area showed E. coli apparently before she came back to see Dr. Luana Shu she was supposed to be receiving antibiotics but she did not really take them. Nor is she offloading this area at all. Finally her last hemoglobin A1c listed in epic was in March 2022 at 14.1 she says things are a lot better since then although I am not sure. 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 She had this wound at the time. During this admission she had E. coli sepsis a TEE was negative. 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 She is still a heavy cigarette smoker. She has a history of bipolar disorder seizure disorder ABI in our clinic was 1.05 6/1; left midfoot in the setting of a TMA done previously. Round circular wound with a "knuckle" of protruding tissue. The problem is that the knuckle was not attached to Heather of the surrounding granulation and  this probed proximally widely I removed a large portion of this tissue. This wound goes with considerable undermining laterally. I do not feel Heather bone there was no purulence but this is a deep wound. 6/8; in spite of the debridement I did last week. She arrives with a wound looking exactly the same. A protruding "knuckle" of tissue nonadherent to most of the surrounding tissue. There is considerable depth around this from 6-12 o'clock at 2.7 cm and undermining of 1 cm. This does not look overtly infected and the x-ray I did last week was negative for Heather osseous abnormalities. We  have been using silver collagen 6/15; deep tissue culture I did last week showed moderate staph aureus and moderate Pseudomonas. This will definitely require prolonged antibiotic therapy. The pathology on the protuberant area was negative for malignancy fungus etc. 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 We have been using silver collagen. I am going to be prescribing Levaquin for 2 weeks. Her CT scan of the foot is down for 7/5 6/22; CT scan of the foot on 7 5. She says she has hardware in the left leg from her previous fracture. She is on the Levaquin for the deep tissue culture I did that showed methicillin sensitive staph aureus and Pseudomonas. I gave her a 2-week supply and she will have another week. She arrives in clinic today with the same protuberant tissue however this is nonadherent to the tissue surrounding it. I am really at a loss to explain this unless there is underlying deep tissue infection 6/29; patient presents for 1 week follow-up. She has been using collagen to the wound bed. She reports taking her antibiotics as prescribed.She has no complaints or issues today. She denies signs of infection. 7/6; patient presents for one week followup. She has been using collagen to the wound bed. She states she is taking Levaquin however at times she is not able to keep it down. She denies signs of infection. 7/13; patient presents for 1 week follow-up. She has been using silver alginate to the wound bed. She still has nausea when taking her antibiotics. She denies signs of infection. 7/20; patient presents for 1 week follow-up. She has been using silver alginate with gentamicin cream to the wound bed. She denies Heather issues and has no complaints today. She denies signs of infection. 7/27; patient presents for 1 week follow-up. She continues to use silver alginate with gentamicin cream to the wound bed. She reports starting her  antibiotics. She has no issues or complaints. Overall she reports stability to the wound. 8/3; patient presents for 1 week follow-up. She has been using silver alginate with gentamicin cream to the wound bed. She reports completing all antibiotics. She has no issues or complaints today. She denies signs of infection. 8/17; patient presents for 2-week follow-up. He is to use silver alginate to the wound bed. She has no issues or complaints today. She denies signs of infection. She reports her pain has improved in her foot since last clinic visit 8/24; patient presents for 1 week follow-up. She continues to use silver alginate to the wound bed. She has no issues or complaints. She denies signs of infection. Pain is stable. 9/7; patient presents for follow-up. She missed her last week appointment due to feeling ill. She continues to use silver alginate. She has a new wound to the right lower extremity that is covered in eschar. She states It occurred over the past week  and has no idea how it started. She currently denies signs of infection. 9/14; patient presents for follow-up. To the left foot wound she has been using gentamicin cream and silver alginate. To the right lower extremity wound she has been keeping this covered and has not obtain Santyl. 9/21; patient presents for follow-up. She reports using gentamicin cream and silver alginate to the left foot and Santyl to the right lower extremity wound. She has no issues or complaints today. She denies signs of infection. 9/28; patient presents for follow-up. She reports a new wound to her right heel. She states this occurred a few days ago and is progressively gotten worse. She has been trying to clean the area with a Q-tip and Santyl. She reports stability in the other 2 wounds. She has been using gentamicin cream and silver alginate to the left foot and Santyl to the right lower extremity wound. 10/12; patient presents for follow-up. She reports  improvement to the wound beds. She is seeing vein and vascular to discuss the potential of a left BKA. She states they are going to do an arteriogram. She continues to use silver alginate with dressing changes to her wounds. 11/2; patient presents for follow-up. She states she has not been doing dressing changes to the wound beds. She states she is not able to offload the areas. She reports chronic pain to her left foot wound. 11/9; patient presents for follow-up. She came in with only socks on. She states she forgot to put on shoes. It is unclear if she is doing Heather dressing changes. She currently denies systemic signs of infection. 11/16; patient presents for follow-up. She came again only with socks on. She states she does not wear shoes ever. It is unclear if she does dressing changes. She currently denies systemic signs of infection. 11/23; patient presents for follow-up. She wore her shoes today. It still unclear exactly what dressing she is using for each wound but she did states she obtained Dakin's solution and has been using this to the left foot wound. She currently denies signs of infection. [name], [name] ([id number]) 11/30; patient presents for follow-up. She has no issues or complaints today. She currently denies signs of infection. 12/7; patient presents for follow-up. She has no issues or complaints today. She has been using Hydrofera Blue to the right heel wound and Dakin solution to the left foot wound. Her right anterior leg wound is healed. She currently denies signs of infection. 12/14; patient presents for follow-up. She has been using Hydrofera Blue to the right heel and Dakin's to the left foot wounds. She has no issues or complaints today. She denies signs of infection. 12/21; patient presents for follow-up. She reports using Hydrofera Blue to the right heel and Dakin's to the left foot wound. She denies signs of infection. 12/28; patient presents for follow-up. She  continues to use Dakin's to the left foot wound and Hydrofera Blue to the right heel wound. She denies signs of infection. 1/4; patient presents for follow-up. She has no issues or complaints today. She denies signs of infection. 1/11; patient presents for follow-up. It is unclear if she has been dressing these wounds over the past week. She currently denies signs of infection. 1/18; patient presents for follow-up. She states she has been using Dakin's wet-to-dry dressings to the left foot. She has been using Hydrofera Blue to the right foot foot wound. She states that the anterior right leg wound has reopened and draining serous fluid. She  denies signs of infection. 1/25; patient presents for follow-up. She has no issues or complaints today. 2/1; patient presents for follow-up. She has no issues or complaints today. She denies signs of infection. 2/8; patient presents for follow-up. She has lost her surgical shoes. She did not have a dressing to the right heel wound. She currently denies signs of infection. 2/15; patient presents for follow-up. She reports more pain to the right heel today. She denies purulent drainage Or fever/chills 2/22; patient presents for follow-up. She reports taking clindamycin over the past week. She states that she continues to have pain to her right heel. She reports purulent drainage. Readmission 03/16/2022 Ms. Cheryal Salas is a 46 year old female with a past medical history of type 2 diabetes, osteomyelitis to her feet, chronic systolic heart failure and bipolar disorder that presents to the clinic for bilateral feet wounds and right lower extremity wound. She was last seen in our clinic on 12/15/2021. At that time she had purulent drainage coming out of her right plantar foot and I recommended she go to the ED. She states she went to [facility] and has been there for the past 3 months. I cannot see the records. She states she had OR debridement and was  on several weeks of IV antibiotics while inpatient. Since discharge she has not been taking care of the wound beds. She had nothing on her feet other than socks today. She currently denies signs of infection. 5/31; patient presents for follow-up. She has been using Dakin's wet-to-dry dressings to the wound beds on her feet bilaterally and antibiotic ointment to the right anterior leg wound. She had a wound culture done at last clinic visit that showed moderate Pseudomonas aeruginosa sensitive to ciprofloxacin. She currently denies systemic signs of infection. 6/14; patient presents for follow-up. She received Keystone 5 days ago and has been using this on the wound beds. She states that last week she had to go to the hospital because she had increased warmth and erythema to the right foot. She was started on 2 oral antibiotics. She states she has been taking these. She currently denies systemic signs of infection. She has no issues or complaints today. Objective Constitutional Vitals Time Taken: 11:53 AM, Temperature: 98 F, Pulse: 76 bpm, Respiratory Rate: 18 breaths/min, Blood Pressure: 147/86 mmHg. General Notes: Right lower extremity: Epithelization to the previous wound site. To the right plantar heel there is an incision site with increased depth throughout. No probing to bone. Nonviable surface with some granulation tissue at the opening. Left Foot: To the medial aspect there is an open wound with granulation tissue and nonviable tissue present With circumferential callus. Integumentary (Hair, Skin) Wound #11 status is Open. Original cause of wound was Surgical Injury. The date acquired was: 12/01/2021. The wound has been in treatment 3 weeks. The wound is located on the [place]. The wound measures 2.3cm length x 0.8cm width x 0.9cm depth; 1.445cm^2 area and 1.301cm^3 volume. There is Fat Layer (Subcutaneous Tissue) exposed. There is no tunneling or undermining noted. There is a  medium amount of purulent drainage noted. The wound margin is distinct with the outline attached to the wound base. There is small (1-33%) red granulation within the wound bed. There is a large (67-100%) amount of necrotic tissue within the wound bed including Adherent Slough. Wound #12 status is Open. Original cause of wound was Pressure Injury. The date acquired was: 03/16/2020. The wound has been in treatment 3 [place], [place] ([id number]) weeks.  The wound is located on the [place]. The wound measures 1.6cm length x 3.2cm width x 0.2cm depth; 4.021cm^2 area and 0.804cm^3 volume. There is Fat Layer (Subcutaneous Tissue) exposed. There is no tunneling or undermining noted. There is a medium amount of serous drainage noted. The wound margin is flat and intact. There is small (1-33%) pink granulation within the wound bed. There is a large (67-100%) amount of necrotic tissue within the wound bed. Wound #13 status is Healed - Epithelialized. Original cause of wound was Gradually Appeared. The date acquired was: 03/15/2021. The wound has been in treatment 3 weeks. The wound is located on the Right,Medial Lower Leg. The wound measures 0cm length x 0cm width x 0cm depth; 0cm^2 area and 0cm^3 volume. There is Fat Layer (Subcutaneous Tissue) exposed. There is a none present amount of drainage noted. The wound margin is flat and intact. There is no granulation within the wound bed. There is no necrotic tissue within the wound bed. 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 I cannot see the records. She is currently on 2 oral antibiotics. Today the wounds look improved. I debrided nonviable tissue. I recommended using Keystone antibiotics daily to the wound beds. I recommended she finish her oral antibiotics. Continue aggressive offloading with surgical shoe and peg assist insert. Follow-up in 1 week. Procedures Wound #11 Pre-procedure diagnosis of Wound #11 is an Open Surgical Wound located on the Right,Plantar Foot . There was a Excisional Skin/Subcutaneous Tissue Debridement with a total area of 2.5 sq cm performed by Kalman Shan, MD. With the following instrument(s): Curette to remove Viable and Non-Viable tissue/material. Material removed includes Callus, Subcutaneous Tissue, and Slough. A time out was conducted at 12:20, prior to the start of the procedure. A Minimum amount of bleeding was controlled with Pressure. The procedure was tolerated well. Post Debridement Measurements: 2.3cm length x 0.8cm width x 0.9cm depth; 1.301cm^3 volume. Character of Wound/Ulcer Post Debridement is stable. 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 There was a Excisional Skin/Subcutaneous Tissue Debridement with a total area of 7 sq cm performed by Kalman Shan, MD. With the following instrument(s): Curette to remove Viable and Non-Viable tissue/material. Material removed includes Callus, Subcutaneous Tissue, and Slough. A time out was conducted at 12:25, prior to the start of the procedure. A Minimum amount of bleeding was controlled with Pressure. The procedure was tolerated well. Post Debridement Measurements: 1.6cm length x 3.2cm width x 0.2cm depth; 0.804cm^3 volume. Character of Wound/Ulcer Post Debridement is stable. Severity of Tissue Post  Debridement is: Fat layer exposed. Post procedure Diagnosis Wound #12: Same as Pre-Procedure Plan 1. In office sharp debridement 2. Keystone antibiotics daily 3. Aggressive offloading 4. Follow-up in 1 week Electronic Signature(s) Signed: 04/06/2022 12:42:30 PM By: Kalman Shan DO Entered By: Kalman Shan on 04/06/2022 12:41:37 [name] ([id number]) [name], [name] Kitchen ([id number]) -------------------------------------------------------------------------------- SuperBill Details Patient Name: Heather Dear A. Date of Service: 04/06/2022 Medical  Record Number: [id number] Patient Account Number: [id number] Date of Birth/Sex: 1976/06/25 (46 y.o. F) Treating RN: Cornell Barman Primary Care Provider: Raelene Bott Other Clinician: Massie Kluver Referring Provider: Raelene Bott Treating Provider/Extender: Yaakov Guthrie in Treatment: 3 Diagnosis Coding ICD-10 Codes Code Description [phone] 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ity 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.621 Type 2 diabetes mellitus with foot ulcer E11.42 Type 2 diabetes mellitus with diabetic polyneuropathy L97.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 of skin Facility Procedures CPT4 Code: [id number] Description: 47096 - DEB SUBQ TISSUE 20 SQ CM/< Modifier: Quantity: 1 CPT4 Code: Description: ICD-10 Diagnosis Description 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 L97.512 Non-pressure chronic ulcer of other part of right foot with fat layer expo E11.621 Type 2 diabetes mellitus with foot  ulcer Modifier: severity sed Quantity: Physician Procedures CPT4 Code: 2836629 Description: 11042 - WC PHYS SUBQ TISS 20 SQ CM Modifier: Quantity: 1 CPT4 Code: Description: ICD-10 Diagnosis Description L97.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  of other part of left foot with other specified L97.512 Non-pressure chronic ulcer of other part of right foot with fat layer expo E11.621 Type 2 diabetes mellitus with foot  ulcer Modifier: severity sed Quantity: Electronic Signature(s) Signed: 04/06/2022 12:42:30 PM By: Kalman Shan DO Entered By: Kalman Shan on 04/06/2022 12:41:53

## 2022-04-06 NOTE — Progress Notes (Signed)
[name], [name] ([id number]) Visit Report for 04/06/2022 Arrival Information Details Patient Name: [name], [name] Date of Service: 04/06/2022 11:15 AM Medical Record Number: [id number] Patient Account Number: [id number] Date of Birth/Sex: [birth year] (46 y.o. F) Treating RN: Levora Dredge Primary Care Briell Paulette: Raelene Bott Other Clinician: Referring Keelyn Monjaras: Raelene Bott Treating Josph Norfleet/Extender: Yaakov Guthrie in Treatment: 3 Visit Information History Since Last Visit Added or deleted any medications: Yes Patient Arrived: Wheel Chair Any new allergies or adverse reactions: No Arrival Time: 11:49 Had a fall or experienced change in No Accompanied By: self activities of daily living that may affect Transfer Assistance: EasyPivot Patient Lift risk of falls: Patient Identification Verified: Yes Hospitalized since last visit: Yes Secondary Verification Process Completed: Yes Has Dressing in Place as Prescribed: Yes Pain Present Now: No Electronic Signature(s) Signed: 04/06/2022 4:18:56 PM By: Levora Dredge Entered By: Levora Dredge on 04/06/2022 11:53:07 [name], [name] ([id number]) -------------------------------------------------------------------------------- Clinic Level of Care Assessment Details Patient Name: KADA, FRIESEN A. Date of Service: 04/06/2022 11:15 AM Medical Record Number: [id number] Patient Account Number: [id number] Date of Birth/Sex: 12/21/1975 (46 y.o. F) Treating RN: Levora Dredge Primary Care Alexsus Papadopoulos: Raelene Bott Other Clinician: Massie Kluver Referring Samone Guhl: Raelene Bott Treating Letticia Bhattacharyya/Extender: Yaakov Guthrie in Treatment: 3 Clinic Level of Care Assessment Items TOOL 1 Quantity Score []  - Use when EandM and Procedure is performed on INITIAL visit 0 ASSESSMENTS - Nursing Assessment / Reassessment []  - General Physical Exam (combine w/ comprehensive assessment (listed just below) when performed on  new 0 pt. evals) []  - 0 Comprehensive Assessment (HX, ROS, Risk Assessments, Wounds Hx, etc.) ASSESSMENTS - Wound and Skin Assessment / Reassessment []  - Dermatologic / Skin Assessment (not related to wound area) 0 ASSESSMENTS - Ostomy and/or Continence Assessment and Care []  - Incontinence Assessment and Management 0 []  - 0 Ostomy Care Assessment and Management (repouching, etc.) PROCESS - Coordination of Care []  - Simple Patient / Family Education for ongoing care 0 []  - 0 Complex (extensive) Patient / Family Education for ongoing care []  - 0 Staff obtains Consents, Records, Test Results / Process Orders []  - 0 Staff telephones HHA, Nursing Homes / Clarify orders / etc []  - 0 Routine Transfer to another Facility (non-emergent condition) []  - 0 Routine Hospital Admission (non-emergent condition) []  - 0 New Admissions / Biomedical engineer / Ordering NPWT, Apligraf, etc. []  - 0 Emergency Hospital Admission (emergent condition) PROCESS - Special Needs []  - Pediatric / Minor Patient Management 0 []  - 0 Isolation Patient Management []  - 0 Hearing / Language / Visual special needs []  - 0 Assessment of Community assistance (transportation, D/C planning, etc.) []  - 0 Additional assistance / Altered mentation []  - 0 Support Surface(s) Assessment (bed, cushion, seat, etc.) INTERVENTIONS - Miscellaneous []  - External ear exam 0 []  - 0 Patient Transfer (multiple staff / Civil Service fast streamer / Similar devices) []  - 0 Simple Staple / Suture removal (25 or less) []  - 0 Complex Staple / Suture removal (26 or more) []  - 0 Hypo/Hyperglycemic Management (do not check if billed separately) []  - 0 Ankle / Brachial Index (ABI) - do not check if billed separately Has the patient been seen at the hospital within the last three years: Yes Total Score: 0 Level Of Care: ____ [name] ([id number]) Electronic Signature(s) Signed: 04/06/2022 4:18:56 PM By: Levora Dredge Entered By:  Levora Dredge on 04/06/2022 12:57:00 [name] ([id number]) -------------------------------------------------------------------------------- Encounter Discharge Information Details Patient Name: Heather Dear A. Date of Service: 04/06/2022  11:15 AM Medical Record Number: [id number] Patient Account Number: [id number] Date of Birth/Sex: 1975/12/28 (46 y.o. F) Treating RN: Levora Dredge Primary Care Bobie Kistler: Raelene Bott Other Clinician: Massie Kluver Referring Gabbi Whetstone: Raelene Bott Treating Jenaro Souder/Extender: Yaakov Guthrie in Treatment: 3 Encounter Discharge Information Items Post Procedure Vitals Discharge Condition: Stable Temperature (F): 98.0 Ambulatory Status: Wheelchair Pulse (bpm): 76 Discharge Destination: Home Respiratory Rate (breaths/min): 16 Transportation: Private Auto Blood Pressure (mmHg): 147/86 Accompanied By: mother Schedule Follow-up Appointment: Yes Clinical Summary of Care: Electronic Signature(s) Signed: 04/06/2022 4:18:56 PM By: Levora Dredge Entered By: Levora Dredge on 04/06/2022 12:58:53 [name] ([id number]) -------------------------------------------------------------------------------- Lower Extremity Assessment Details Patient Name: Heather Dear A. Date of Service: 04/06/2022 11:15 AM Medical Record Number: [id number] Patient Account Number: [id number] Date of Birth/Sex: 04/03/1976 (46 y.o. F) Treating RN: Levora Dredge Primary Care Vikrant Pryce: Raelene Bott Other Clinician: Referring Darron Stuck: Raelene Bott Treating Quindarrius Joplin/Extender: Yaakov Guthrie in Treatment: 3 Edema Assessment Assessed: [Left: Yes] [Right: Yes] Edema: [Left: No] [Right: No] Electronic Signature(s) Signed: 04/06/2022 4:18:56 PM By: Levora Dredge Entered By: Levora Dredge on 04/06/2022 12:15:34 Heather Dear A. ([id number]) -------------------------------------------------------------------------------- Multi Wound Chart  Details Patient Name: Heather Dear A. Date of Service: 04/06/2022 11:15 AM Medical Record Number: [id number] Patient Account Number: [id number] Date of Birth/Sex: 10-27-1975 (46 y.o. 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 Comorbid History: Chronic sinus problems/congestion, Chronic sinus problems/congestion, Chronic sinus problems/congestion, Middle ear problems, Anemia, Middle ear problems, Anemia, Middle ear problems, Anemia, Chronic Obstructive Pulmonary Chronic Obstructive Pulmonary Chronic Obstructive Pulmonary Disease (COPD), Congestive Heart Disease (COPD), Congestive Heart Disease (COPD), Congestive Heart Failure, Type II Diabetes, End Stage Failure, Type II Diabetes, End Stage Failure, Type II Diabetes, End Stage Renal Disease, History of pressure Renal Disease, History of pressure Renal Disease, History of pressure wounds, Neuropathy wounds, Neuropathy wounds, Neuropathy Date Acquired: 12/01/2021 03/16/2020 03/15/2021 Weeks of Treatment: 3 3 3  Wound Status: Open Open Healed - Epithelialized Wound Recurrence: No No No Measurements L x W x D (cm) 2.3x0.8x0.9 1.6x3.2x0.2 0x0x0 Area (cm) : 1.445 4.021 0 Volume (cm) : 1.301 0.804 0 % Reduction in Area: 21.70% 14.70% 100.00% % Reduction in Volume: 21.70% 14.60% 100.00% Classification: Full Thickness Without Exposed Grade 3 Grade 1 Support  Structures Exudate Amount: Medium Medium None Present Exudate Type: Purulent Serous N/A Exudate Color: yellow, brown, green amber N/A Wound Margin: Distinct, outline attached Flat and Intact Flat and Intact Granulation Amount: Small (1-33%) Small (1-33%) None Present (0%) Granulation Quality: Red Pink N/A Necrotic Amount: Large (67-100%) Large (67-100%) None Present (0%) Exposed Structures: Fat Layer (Subcutaneous Tissue): Fat Layer (Subcutaneous Tissue): Fat Layer (Subcutaneous Tissue): Yes Yes Yes Fascia: No Fascia: No Fascia: No Tendon: No Tendon: No Tendon: No Muscle: No Muscle: No Muscle: No Joint: No Joint: No Joint: No Bone: No Bone: No Bone: No Epithelialization: Small (1-33%) Small (1-33%) Large (67-100%) Treatment Notes [name], [name] ([id number]) Electronic Signature(s) Signed: 04/06/2022 4:18:56 PM By: Levora Dredge Entered By: Levora Dredge on 04/06/2022 12:19:19 [name] ([id number]) -------------------------------------------------------------------------------- Multi-Disciplinary Care Plan Details Patient Name: Heather Dear A. Date  of Service: 04/06/2022 11:15 AM Medical Record Number: [id number] Patient Account Number: [id number] Date of Birth/Sex: May 09, 1976 (46 y.o. F) Treating RN: Levora Dredge Primary Care Dellene Mcgroarty: Raelene Bott Other Clinician: Referring Love Chowning: Raelene Bott Treating Gurvir Schrom/Extender: Yaakov Guthrie in Treatment: 3 Active Inactive Abuse / Safety / Falls / Self Care Management Nursing Diagnoses: History of Falls Potential for falls Potential for injury related to falls Goals: Patient will not develop complications from immobility Date Initiated: 03/17/2022 Target Resolution Date: 03/16/2022 Goal Status: Active Patient/caregiver will verbalize understanding of skin care regimen Date Initiated: 03/17/2022 Target Resolution Date: 03/16/2022 Goal Status: Active Patient/caregiver will  verbalize/demonstrate measure taken to improve self care Date Initiated: 03/17/2022 Target Resolution Date: 03/16/2022 Goal Status: Active Interventions: Assess fall risk on admission and as needed Provide education on basic hygiene Provide education on personal and home safety Notes: Necrotic Tissue Nursing Diagnoses: Impaired tissue integrity related to necrotic/devitalized tissue Knowledge deficit related to management of necrotic/devitalized tissue Goals: Necrotic/devitalized tissue will be minimized in the wound bed Date Initiated: 03/17/2022 Target Resolution Date: 03/16/2022 Goal Status: Active Patient/caregiver will verbalize understanding of reason and process for debridement of necrotic tissue Date Initiated: 03/17/2022 Target Resolution Date: 03/16/2022 Goal Status: Active Interventions: Provide education on necrotic tissue and debridement process Treatment Activities: Apply topical anesthetic as ordered : 03/16/2022 Excisional debridement : 03/16/2022 Notes: Orientation to the Wound Care Program Nursing Diagnoses: Knowledge deficit related to the wound healing center program [name], [name] ([id number]) Goals: Patient/caregiver will verbalize understanding of the [place] Date Initiated: 03/17/2022 Target Resolution Date: 03/16/2022 Goal Status: Active Interventions: Provide education on orientation to the wound center Notes: Osteomyelitis Nursing Diagnoses: Infection: osteomyelitis Knowledge deficit related to disease process and management Goals: Patient/caregiver will verbalize understanding of disease process and disease management Date Initiated: 03/17/2022 Target Resolution Date: 03/16/2022 Goal Status: Active Patient's osteomyelitis will resolve Date Initiated: 03/17/2022 Target Resolution Date: 03/16/2022 Goal Status: Active Signs and symptoms for osteomyelitis will be recognized and promptly addressed Date Initiated: 03/17/2022 Target  Resolution Date: 03/16/2022 Goal Status: Active Interventions: Assess for signs and symptoms of osteomyelitis resolution every visit Provide education on osteomyelitis Treatment Activities: Systemic antibiotics : 03/16/2022 Notes: Wound/Skin Impairment Nursing Diagnoses: Impaired tissue integrity Knowledge deficit related to smoking impact on wound healing Knowledge deficit related to ulceration/compromised skin integrity Goals: Patient/caregiver will verbalize understanding of skin care regimen Date Initiated: 03/17/2022 Target Resolution Date: 03/16/2022 Goal Status: Active Ulcer/skin breakdown will have a volume reduction of 30% by week 4 Date Initiated: 03/17/2022 Target Resolution Date: 04/13/2022 Goal Status: Active Ulcer/skin breakdown will have a volume reduction of 50% by week 8 Date Initiated: 03/17/2022 Target Resolution Date: 05/04/2022 Goal Status: Active Ulcer/skin breakdown will have a volume reduction of 80% by week 12 Date Initiated: 03/17/2022 Target Resolution Date: 06/01/2022 Goal Status: Active Ulcer/skin breakdown will heal within 14 weeks Date Initiated: 03/17/2022 Target Resolution Date: 06/15/2022 Goal Status: Active Interventions: Assess ulceration(s) every visit Provide education on ulcer and skin care Treatment Activities: Skin care regimen initiated : 03/16/2022 [name], [name] ([id number]) Notes: Electronic Signature(s) Signed: 04/06/2022 4:18:56 PM By: Levora Dredge Entered By: Levora Dredge on 04/06/2022 12:19:09 [name] ([id number]) -------------------------------------------------------------------------------- Pain Assessment Details Patient Name: Heather Dear A. Date of Service: 04/06/2022 11:15 AM Medical Record Number: [id number] Patient Account Number: [id number] Date of Birth/Sex: Feb 12, 1976 (46 y.o. F) Treating RN: Levora Dredge Primary Care Ruffin Lada: Raelene Bott Other Clinician: Referring Tashi Band: Raelene Bott Treating Sebrina Kessner/Extender: Yaakov Guthrie in Treatment: 3 Active  Problems Location of Pain Severity and Description of Pain Patient Has Paino No Site Locations Rate the pain. Current Pain Level: 0 Pain Management and Medication Current Pain Management: Electronic Signature(s) Signed: 04/06/2022 4:18:56 PM By: Levora Dredge Entered By: Levora Dredge on 04/06/2022 11:54:45 [name] ([id number]) -------------------------------------------------------------------------------- Patient/Caregiver Education Details Patient Name: Heather Dear A. Date of Service: 04/06/2022 11:15 AM Medical Record Number: [id number] Patient Account Number: [id number] Date of Birth/Gender: 11-19-1975 (46 y.o. F) Treating RN: Levora Dredge Primary Care Physician: Raelene Bott Other Clinician: Massie Kluver Referring Physician: Raelene Bott Treating Physician/Extender: Yaakov Guthrie in Treatment: 3 Education Assessment Education Provided To: Patient and Caregiver Education Topics Provided Infection: Handouts: Infection Prevention and Management Methods: Explain/Verbal Responses: State content correctly Wound/Skin Impairment: Handouts: Other: continue wound care as directed Electronic Signature(s) Signed: 04/06/2022 4:18:56 PM By: Levora Dredge Entered By: Levora Dredge on 04/06/2022 12:57:44 [name], [name] ([id number]) -------------------------------------------------------------------------------- Wound Assessment Details Patient Name: Heather Dear A. Date of Service: 04/06/2022 11:15 AM Medical Record Number: [id number] Patient Account Number: [id number] Date of Birth/Sex: Nov 04, 1975 (46 y.o. F) Treating RN: Levora Dredge Primary Care Philemon Riedesel: Raelene Bott Other Clinician: Massie Kluver Referring Gershom Brobeck: Raelene Bott Treating Colbi Staubs/Extender: Yaakov Guthrie in Treatment: 3 Wound Status Wound Number: 11 Primary Open Surgical  Wound Etiology: Wound Location: Right, Plantar Foot Wound Open Wounding Event: Surgical Injury Status: Date Acquired: 12/01/2021 Comorbid Chronic sinus problems/congestion, Middle ear problems, Weeks Of Treatment: 3 History: Anemia, Chronic Obstructive Pulmonary Disease (COPD), Clustered Wound: No Congestive Heart Failure, Type II Diabetes, End Stage Renal Disease, History of pressure wounds, Neuropathy Photos Wound Measurements Length: (cm) 2.3 Width: (cm) 0.8 Depth: (cm) 0.9 Area: (cm) 1.445 Volume: (cm) 1.301 % Reduction in Area: 21.7% % Reduction in Volume: 21.7% Epithelialization: Small (1-33%) Tunneling: No Undermining: No Wound Description Classification: Full Thickness Without Exposed Support Structures Wound Margin: Distinct, outline attached Exudate Amount: Medium Exudate Type: Purulent Exudate Color: yellow, brown, green Foul Odor After Cleansing: No Slough/Fibrino Yes Wound Bed Granulation Amount: Small (1-33%) Exposed Structure Granulation Quality: Red Fascia Exposed: No Necrotic Amount: Large (67-100%) Fat Layer (Subcutaneous Tissue) Exposed: Yes Necrotic Quality: Adherent Slough Tendon Exposed: No Muscle Exposed: No Joint Exposed: No Bone Exposed: No Treatment Notes Wound #11 (Foot) Wound Laterality: Plantar, Right Cleanser Soap and Water Discharge Instruction: Gently cleanse wound with antibacterial soap, rinse and pat dry prior to dressing wounds [name], [name] ([id number]) Peri-Wound Care Topical Primary Dressing Gauze Discharge Instruction: As directed: moistened with Dakins Solution Secondary Dressing ABD Pad 5x9 (in/in) Discharge Instruction: Cover with ABD pad Secured With Medipore Tape - 41M Medipore H Soft Cloth Surgical Tape, 2x2 (in/yd) Kerlix Roll Sterile or Non-Sterile 6-ply 4.5x4 (yd/yd) Discharge Instruction: Apply Kerlix as directed Compression Wrap Compression Stockings Add-Ons Electronic Signature(s) Signed: 04/06/2022  4:18:56 PM By: Levora Dredge Entered By: Levora Dredge on 04/06/2022 12:12:34 Heather Dear A. ([id number]) -------------------------------------------------------------------------------- Wound Assessment Details Patient Name: Heather Dear A. Date of Service: 04/06/2022 11:15 AM Medical Record Number: [id number] Patient Account Number: [id number] Date of Birth/Sex: 1975/11/03 (46 y.o. F) Treating RN: Levora Dredge Primary Care Alarik Radu: Raelene Bott Other Clinician: Referring Curren Mohrmann: Raelene Bott Treating Antonino Nienhuis/Extender: Yaakov Guthrie in Treatment: 3 Wound Status Wound Number: 12 Primary Diabetic Wound/Ulcer of the Lower Extremity Etiology: Wound Location: Left, Medial, Plantar Foot Wound Open Wounding Event: Pressure Injury Status: Date Acquired: 03/16/2020 Comorbid Chronic sinus problems/congestion, Middle ear problems, Weeks Of Treatment: 3 History: Anemia, Chronic Obstructive Pulmonary Disease (COPD), Clustered Wound: No Congestive Heart Failure, Type  II Diabetes, End Stage Renal Disease, History of pressure wounds, Neuropathy Photos Wound Measurements Length: (cm) 1.6 Width: (cm) 3.2 Depth: (cm) 0.2 Area: (cm) 4.021 Volume: (cm) 0.804 % Reduction in Area: 14.7% % Reduction in Volume: 14.6% Epithelialization: Small (1-33%) Tunneling: No Undermining: No Wound Description Classification: Grade 3 Wound Margin: Flat and Intact Exudate Amount: Medium Exudate Type: Serous Exudate Color: amber Foul Odor After Cleansing: No Slough/Fibrino Yes Wound Bed Granulation Amount: Small (1-33%) Exposed Structure Granulation Quality: Pink Fascia Exposed: No Necrotic Amount: Large (67-100%) Fat Layer (Subcutaneous Tissue) Exposed: Yes Tendon Exposed: No Muscle Exposed: No Joint Exposed: No Bone Exposed: No Treatment Notes Wound #12 (Foot) Wound Laterality: Plantar, Left, Medial Cleanser Soap and Water Discharge Instruction: Gently cleanse wound  with antibacterial soap, rinse and pat dry prior to dressing wounds [name], [name] ([id number]) Peri-Wound Care Topical Primary Dressing Gauze Discharge Instruction: As directed: moistened with Dakins Solution Secondary Dressing ABD Pad 5x9 (in/in) Discharge Instruction: Cover with ABD pad Secured With Medipore Tape - 41M Medipore H Soft Cloth Surgical Tape, 2x2 (in/yd) Kerlix Roll Sterile or Non-Sterile 6-ply 4.5x4 (yd/yd) Discharge Instruction: Apply Kerlix as directed Compression Wrap Compression Stockings Add-Ons Electronic Signature(s) Signed: 04/06/2022 4:18:56 PM By: Levora Dredge Entered By: Levora Dredge on 04/06/2022 12:18:50 Heather Dear A. ([id number]) -------------------------------------------------------------------------------- Wound Assessment Details Patient Name: Heather Dear A. Date of Service: 04/06/2022 11:15 AM Medical Record Number: [id number] Patient Account Number: [id number] Date of Birth/Sex: Jul 30, 1976 (46 y.o. F) Treating RN: Levora Dredge Primary Care Jovon Winterhalter: Raelene Bott Other Clinician: Referring Abbegail Matuska: Raelene Bott Treating Kaye Mitro/Extender: Yaakov Guthrie in Treatment: 3 Wound Status Wound Number: 13 Primary Diabetic Wound/Ulcer of the Lower Extremity Etiology: Wound Location: Right, Medial Lower Leg Wound Healed - Epithelialized Wounding Event: Gradually Appeared Status: Date Acquired: 03/15/2021 Comorbid Chronic sinus problems/congestion, Middle ear problems, Weeks Of Treatment: 3 History: Anemia, Chronic Obstructive Pulmonary Disease (COPD), Clustered Wound: No Congestive Heart Failure, Type II Diabetes, End Stage Renal Disease, History of pressure wounds, Neuropathy Photos Wound Measurements Length: (cm) 0 Width: (cm) 0 Depth: (cm) 0 Area: (cm) 0 Volume: (cm) 0 % Reduction in Area: 100% % Reduction in Volume: 100% Epithelialization: Large (67-100%) Wound Description Classification: Grade 1 Wound  Margin: Flat and Intact Exudate Amount: None Present Foul Odor After Cleansing: No Slough/Fibrino No Wound Bed Granulation Amount: None Present (0%) Exposed Structure Necrotic Amount: None Present (0%) Fascia Exposed: No Fat Layer (Subcutaneous Tissue) Exposed: Yes Tendon Exposed: No Muscle Exposed: No Joint Exposed: No Bone Exposed: No Electronic Signature(s) Signed: 04/06/2022 4:18:56 PM By: Levora Dredge Entered By: Levora Dredge on 04/06/2022 12:14:34 [name] ([id number]) -------------------------------------------------------------------------------- Vitals Details Patient Name: Heather Dear A. Date of Service: 04/06/2022 11:15 AM Medical Record Number: [id number] Patient Account Number: [id number] Date of Birth/Sex: [birth year] (46 y.o. F) Treating RN: Levora Dredge Primary Care Baylea Milburn: Raelene Bott Other Clinician: Referring Khamila Bassinger: Raelene Bott Treating Mechel Haggard/Extender: Yaakov Guthrie in Treatment: 3 Vital Signs Time Taken: 11:53 Temperature (F): 98 Pulse (bpm): 76 Respiratory Rate (breaths/min): 18 Blood Pressure (mmHg): 147/86 Reference Range: 80 - 120 mg / dl Electronic Signature(s) Signed: 04/06/2022 4:18:56 PM By: Levora Dredge Entered By: Levora Dredge on 04/06/2022 11:54:37

## 2022-04-13 ENCOUNTER — Encounter (HOSPITAL_BASED_OUTPATIENT_CLINIC_OR_DEPARTMENT_OTHER): Payer: Medicaid Other | Admitting: Internal Medicine

## 2022-04-13 DIAGNOSIS — L97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ity: Secondary | ICD-10-CM | POA: Diagnosis not present

## 2022-04-13 DIAGNOSIS — L97512 Non-pressure chronic ulcer of other part of right foot with fat layer exposed: Secondary | ICD-10-CM | POA: Diagnosis not present

## 2022-04-13 DIAGNOSIS — E11621 Type 2 diabetes mellitus with foot ulcer: Secondary | ICD-10-CM

## 2022-04-20 ENCOUNTER — Encounter: Payer: Medicaid Other | Admitting: Internal Medicine

## 2022-04-20 DIAGNOSIS — L97812 Non-pressure chronic ulcer of other part of right lower leg with fat layer exposed: Secondary | ICD-10-CM | POA: Diagnosis not present

## 2022-04-20 DIAGNOSIS — E11622 Type 2 diabetes mellitus with other skin ulcer: Secondary | ICD-10-CM | POA: Diagnosis not present

## 2022-04-20 DIAGNOSIS — E1142 Type 2 diabetes mellitus with diabetic polyneuropathy: Secondary | ICD-10-CM | POA: Diagnosis not present

## 2022-04-20 DIAGNOSIS — L97512 Non-pressure chronic ulcer of other part of right foot with fat layer exposed: Secondary | ICD-10-CM | POA: Diagnosis not present

## 2022-04-20 DIAGNOSIS — L97522 Non-pressure chronic ulcer of other part of left foot with fat layer exposed: Secondary | ICD-10-CM | POA: Diagnosis not present

## 2022-04-20 DIAGNOSIS — E11621 Type 2 diabetes mellitus with foot ulcer: Secondary | ICD-10-CM | POA: Diagnosis present

## 2022-04-21 NOTE — Progress Notes (Signed)
[name], [name] ([id number]) Visit Report for 04/20/2022 Arrival Information Details Patient Name: [name], [name] Date of Service: 04/20/2022 11:15 AM Medical Record Number: [id number] Patient Account Number: [id number] Date of Birth/Sex: 1976/05/02 (46 y.o. F) Treating RN: Levora Dredge Primary Care Antwanette Wesche: Raelene Bott Other Clinician: Massie Kluver Referring Teodoro Jeffreys: Raelene Bott Treating Shakyla Nolley/Extender: Yaakov Guthrie in Treatment: 5 Visit Information History Since Last Visit Added or deleted any medications: No Patient Arrived: Wheel Chair Any new allergies or adverse reactions: No Arrival Time: 11:11 Had a fall or experienced change in No Accompanied By: mother activities of daily living that may affect Transfer Assistance: EasyPivot Patient Lift risk of falls: Patient Identification Verified: Yes Hospitalized since last visit: No Secondary Verification Process Completed: Yes Has Dressing in Place as Prescribed: Yes Pain Present Now: Yes Electronic Signature(s) Signed: 04/20/2022 4:35:40 PM By: Levora Dredge Entered By: Levora Dredge on 04/20/2022 11:13:50 Romulus, [name] ([id number]) -------------------------------------------------------------------------------- Clinic Level of Care Assessment Details Patient Name: Heather Dear A. Date of Service: 04/20/2022 11:15 AM Medical Record Number: [id number] Patient Account Number: [id number] Date of Birth/Sex: 1976-10-09 (46 y.o. F) Treating RN: Levora Dredge Primary Care Claris Guymon: Raelene Bott Other Clinician: Massie Kluver Referring Laquida Cotrell: Raelene Bott Treating Reyn Faivre/Extender: Yaakov Guthrie in Treatment: 5 Clinic Level of Care Assessment Items TOOL 4 Quantity Score _0  - Use when only an EandM is performed on FOLLOW-UP visit 0 ASSESSMENTS - Nursing Assessment / Reassessment X - Reassessment of Co-morbidities (includes updates in patient status) 1 10 X- 1 5 Reassessment  of Adherence to Treatment Plan ASSESSMENTS - Wound and Skin Assessment / Reassessment _1  - Simple Wound Assessment / Reassessment - one wound 0 X- 2 5 Complex Wound Assessment / Reassessment - multiple wounds _2  - 0 Dermatologic / Skin Assessment (not related to wound area) ASSESSMENTS - Focused Assessment X - Circumferential Edema Measurements - multi extremities 1 5 _3  - 0 Nutritional Assessment / Counseling / Intervention _4  - 0 Lower Extremity Assessment (monofilament, tuning fork, pulses) _5  - 0 Peripheral Arterial Disease Assessment (using hand held doppler) ASSESSMENTS - Ostomy and/or Continence Assessment and Care _6  - Incontinence Assessment and Management 0 _7  - 0 Ostomy Care Assessment and Management (repouching, etc.) PROCESS - Coordination of Care X - Simple Patient / Family Education for ongoing care 1 15 _8  - 0 Complex (extensive) Patient / Family Education for ongoing care _9  - 0 Staff obtains Programmer, systems, Records, Test Results / Process Orders _10  - 0 Staff telephones HHA, Nursing Homes / Clarify orders / etc _11  - 0 Routine Transfer to another Facility (non-emergent condition) _12  - 0 Routine Hospital Admission (non-emergent condition) _13  - 0 New Admissions / Biomedical engineer / Ordering NPWT, Apligraf, etc. _14  - 0 Emergency Hospital Admission (emergent condition) X- 1 10 Simple Discharge Coordination _15  - 0 Complex (extensive) Discharge Coordination PROCESS - Special Needs _16  - Pediatric / Minor Patient Management 0 _17  - 0 Isolation Patient Management _18  - 0 Hearing / Language / Visual special needs _19  - 0 Assessment of Community assistance (transportation, D/C planning, etc.) _20  - 0 Additional assistance / Altered mentation _21  - 0 Support Surface(s) Assessment (bed, cushion, seat, etc.) INTERVENTIONS - Wound Cleansing / Measurement [name], [name] ([id number]) _22  - 0 Simple Wound Cleansing - one wound X- 2 5 Complex Wound Cleansing -  multiple wounds X- 1 5 Wound Imaging (photographs - any number of wounds) _23  - 0 Wound Tracing (instead of photographs) _24  - 0 Simple Wound Measurement - one wound X-  2 5 Complex Wound Measurement - multiple wounds INTERVENTIONS - Wound Dressings _0  - Small Wound Dressing one or multiple wounds 0 X- 2 15 Medium Wound Dressing one or multiple wounds _1  - 0 Large Wound Dressing one or multiple wounds X- 1 5 Application of Medications - topical <[id number]>_5<\/HQIONGEXBMWUXLKG>_4  - 0 Application of Medications - injection INTERVENTIONS - Miscellaneous _3  - External ear exam 0 _4  - 0 Specimen Collection (cultures, biopsies, blood, body fluids, etc.) _5  - 0 Specimen(s) / Culture(s) sent or taken to Lab for analysis _6  - 0 Patient Transfer (multiple staff / Civil Service fast streamer / Similar devices) _7  - 0 Simple Staple / Suture removal (25 or less) _8  - 0 Complex Staple / Suture removal (26 or more) _9  - 0 Hypo / Hyperglycemic Management (close monitor of Blood Glucose) _10  - 0 Ankle / Brachial Index (ABI) - do not check if billed separately X- 1 5 Vital Signs Has the patient been seen at the hospital within the last three years: Yes Total Score: 120 Level Of Care: New/Established - Level 4 Electronic Signature(s) Signed: 04/20/2022 4:35:40 PM By: Levora Dredge Entered By: Levora Dredge on 04/20/2022 16:26:37 [name] ([id number]) -------------------------------------------------------------------------------- Encounter Discharge Information Details Patient Name: Heather Dear A. Date of Service: 04/20/2022 11:15 AM Medical Record Number: [id number] Patient Account Number: [id number] Date of Birth/Sex: Jun 28, 1976 (46 y.o. F) Treating RN: Levora Dredge Primary Care Lacheryl Niesen: Raelene Bott Other Clinician: Massie Kluver Referring Mariadel Mruk: Raelene Bott Treating Tomeka Kantner/Extender: Yaakov Guthrie in Treatment: 5 Encounter Discharge Information Items Discharge Condition: Stable Ambulatory  Status: Wheelchair Discharge Destination: Home Transportation: Private Auto Accompanied By: mom Schedule Follow-up Appointment: Yes Clinical Summary of Care: Electronic Signature(s) Signed: 04/20/2022 4:27:40 PM By: Levora Dredge Entered By: Levora Dredge on 04/20/2022 16:27:40 Dimaggio, [name] ([id number]) -------------------------------------------------------------------------------- Lower Extremity Assessment Details Patient Name: Heather Dear A. Date of Service: 04/20/2022 11:15 AM Medical Record Number: [id number] Patient Account Number: [id number] Date of Birth/Sex: 1976-03-31 (46 y.o. F) Treating RN: Levora Dredge Primary Care Lelia Jons: Raelene Bott Other Clinician: Massie Kluver Referring Reynold Mantell: Raelene Bott Treating Neena Beecham/Extender: Yaakov Guthrie in Treatment: 5 Edema Assessment Assessed: [Left: No] [Right: No] Edema: [Left: Yes] [Right: Yes] Calf Left: Right: Point of Measurement: 29 cm From Medial Instep 28.5 cm 33 cm Ankle Left: Right: Point of Measurement: 10 cm From Medial Instep 21 cm 20 cm Vascular Assessment Pulses: Dorsalis Pedis Palpable: [Left:Yes] [Right:Yes] Electronic Signature(s) Signed: 04/20/2022 4:35:40 PM By: Levora Dredge Entered By: Levora Dredge on 04/20/2022 11:25:36 Lindor, [name] ([id number]) -------------------------------------------------------------------------------- Multi Wound Chart Details Patient Name: Heather Dear A. Date of Service: 04/20/2022 11:15 AM Medical Record Number: [id number] Patient Account Number: [id number] Date of Birth/Sex: 01-Nov-1975 (46 y.o. 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 Comorbid History: Chronic sinus problems/congestion, Chronic sinus problems/congestion, N/A Middle ear problems, Anemia, Middle ear problems, Anemia, Chronic Obstructive Pulmonary Chronic Obstructive Pulmonary Disease (COPD), Congestive Heart Disease (COPD), Congestive Heart Failure, Type II Diabetes, End Stage Failure, Type II Diabetes, End Stage Renal Disease, History of pressure Renal Disease, History of pressure wounds, Neuropathy wounds, Neuropathy Date Acquired: 12/01/2021 03/16/2020 N/A [place]  of Treatment: 5 5 N/A Wound Status: Open Open N/A Wound Recurrence: No No N/A Measurements L x W x D (cm) 2.5x0.3x1 3.5x1.5x0.2 N/A Area (cm) : 0.589 4.123 N/A Volume (cm) : 0.589 0.825 N/A % Reduction in Area: 68.10% 12.50% N/A % Reduction in Volume: 64.50% 12.40% N/A Classification: Full Thickness Without Exposed Grade 3 N/A Support Structures Exudate Amount: Medium Medium N/A Exudate Type: Serosanguineous Serous N/A Exudate Color: red, brown amber N/A Wound Margin: Distinct, outline attached Flat and Intact N/A Granulation Amount: Medium (34-66%) Medium (34-66%) N/A Granulation Quality: Red Red N/A Necrotic Amount: Medium (34-66%) Medium (34-66%) N/A Exposed Structures: Fat Layer (Subcutaneous Tissue): Fat Layer (Subcutaneous Tissue): N/A Yes Yes Fascia: No Fascia: No Tendon: No Tendon: No Muscle: No Muscle: No Joint: No Joint: No Bone: No Bone: No Epithelialization: Small (1-33%) Small (1-33%) N/A Treatment Notes [name], [name] ([id number]) Electronic Signature(s) Signed: 04/20/2022 4:35:40 PM By: Levora Dredge Entered By: Levora Dredge on 04/20/2022 11:44:55 [name]  ([id number]) -------------------------------------------------------------------------------- Multi-Disciplinary Care Plan Details Patient Name: Heather Dear A. Date of Service: 04/20/2022 11:15 AM Medical Record Number: [id number] Patient Account Number: [id number] Date of Birth/Sex: 07-Sep-1976 (46 y.o. F) Treating RN: Levora Dredge Primary Care Remy Voiles: Raelene Bott Other Clinician: Massie Kluver Referring Xochitl Egle: Raelene Bott Treating Denai Caba/Extender: Yaakov Guthrie in Treatment: 5 Active Inactive Abuse / Safety / Falls / Self Care Management Nursing Diagnoses: History of Falls Potential for falls Potential for injury related to falls Goals: Patient will not develop complications from immobility Date Initiated: 03/17/2022 Date Inactivated: 04/20/2022 Target Resolution Date: 03/16/2022 Goal Status: Met Patient/caregiver will verbalize understanding of skin care regimen Date Initiated: 03/17/2022 Target Resolution Date: 03/16/2022 Goal Status: Active Patient/caregiver will verbalize/demonstrate measure taken to improve self care Date Initiated: 03/17/2022 Target Resolution Date: 03/16/2022 Goal Status: Active Interventions: Assess fall risk on admission and as needed Provide education on basic hygiene Provide education on personal and home safety Notes: Necrotic Tissue Nursing Diagnoses: Impaired tissue integrity related to necrotic/devitalized tissue Knowledge deficit related to management of necrotic/devitalized tissue Goals: Necrotic/devitalized tissue will be minimized in the wound bed Date Initiated: 03/17/2022 Target Resolution Date: 03/16/2022 Goal Status: Active Patient/caregiver will verbalize understanding of reason and process for debridement of necrotic tissue Date Initiated: 03/17/2022 Target Resolution Date: 03/16/2022 Goal Status: Active Interventions: Provide education on necrotic tissue and debridement process Treatment Activities: Apply  topical anesthetic as ordered : 03/16/2022 Excisional debridement : 03/16/2022 Notes: Orientation to the Wound Care Program Nursing Diagnoses: Knowledge deficit related to the wound healing center program [name], [name] ([id number]) Goals: Patient/caregiver will verbalize understanding of the [place] Date Initiated: 03/17/2022 Target Resolution Date: 03/16/2022 Goal Status: Active Interventions: Provide education on orientation to the wound center Notes: Osteomyelitis Nursing Diagnoses: Infection: osteomyelitis Knowledge deficit related to disease process and management Goals: Patient/caregiver will verbalize understanding of disease process and disease management Date Initiated: 03/17/2022 Date Inactivated: 04/20/2022 Target Resolution Date: 03/16/2022 Goal Status: Met Patient's osteomyelitis will resolve Date Initiated: 03/17/2022 Target Resolution Date: 03/16/2022 Goal Status: Active Signs and symptoms for osteomyelitis will be recognized and promptly addressed Date Initiated: 03/17/2022 Target Resolution Date: 03/16/2022 Goal Status: Active Interventions: Assess for signs and symptoms of osteomyelitis resolution every visit Provide education on osteomyelitis Treatment Activities: Systemic antibiotics : 03/16/2022 Notes: Wound/Skin Impairment Nursing Diagnoses: Impaired tissue integrity Knowledge deficit related to smoking impact on wound healing Knowledge deficit related to ulceration/compromised skin integrity Goals: Patient/caregiver will verbalize understanding of skin care regimen Date Initiated: 03/17/2022 Date  Inactivated: 04/20/2022 Target Resolution Date: 03/16/2022 Goal Status: Met Ulcer/skin breakdown will have a volume reduction of 30% by week 4 Date Initiated: 03/17/2022 Target Resolution Date: 04/13/2022 Goal Status: Active Ulcer/skin breakdown will have a volume reduction of 50% by week 8 Date Initiated: 03/17/2022 Target Resolution Date:  05/04/2022 Goal Status: Active Ulcer/skin breakdown will have a volume reduction of 80% by week 12 Date Initiated: 03/17/2022 Target Resolution Date: 06/01/2022 Goal Status: Active Ulcer/skin breakdown will heal within 14 weeks Date Initiated: 03/17/2022 Target Resolution Date: 06/15/2022 Goal Status: Active Interventions: Assess ulceration(s) every visit Provide education on ulcer and skin care Treatment Activities: Skin care regimen initiated : 03/16/2022 [name], [name] ([id number]) Notes: Electronic Signature(s) Signed: 04/20/2022 4:35:40 PM By: Levora Dredge Entered By: Levora Dredge on 04/20/2022 11:44:38 Felan, Shannon A. ([id number]) -------------------------------------------------------------------------------- Pain Assessment Details Patient Name: Heather Dear A. Date of Service: 04/20/2022 11:15 AM Medical Record Number: [id number] Patient Account Number: [id number] Date of Birth/Sex: 1976-02-27 (46 y.o. F) Treating RN: Levora Dredge Primary Care Malonie Tatum: Raelene Bott Other Clinician: Massie Kluver Referring Kanda Deluna: Raelene Bott Treating Andru Genter/Extender: Yaakov Guthrie in Treatment: 5 Active Problems Location of Pain Severity and Description of Pain Patient Has Paino Yes Site Locations Rate the pain. Current Pain Level: 8 Pain Management and Medication Current Pain Management: Notes pt states pain in right foot Electronic Signature(s) Signed: 04/20/2022 4:35:40 PM By: Levora Dredge Entered By: Levora Dredge on 04/20/2022 11:15:49 [name] ([id number]) -------------------------------------------------------------------------------- Patient/Caregiver Education Details Patient Name: Heather Dear A. Date of Service: 04/20/2022 11:15 AM Medical Record Number: [id number] Patient Account Number: [id number] Date of Birth/Gender: 02/28/1976 (46 y.o. F) Treating RN: Levora Dredge Primary Care Physician: Raelene Bott Other  Clinician: Massie Kluver Referring Physician: Raelene Bott Treating Physician/Extender: Yaakov Guthrie in Treatment: 5 Education Assessment Education Provided To: Patient Education Topics Provided Wound/Skin Impairment: Handouts: Caring for Your Ulcer Methods: Explain/Verbal Responses: State content correctly Electronic Signature(s) Signed: 04/20/2022 4:35:40 PM By: Levora Dredge Entered By: Levora Dredge on 04/20/2022 16:27:05 Heather Dear A. ([id number]) -------------------------------------------------------------------------------- Wound Assessment Details Patient Name: Heather Dear A. Date of Service: 04/20/2022 11:15 AM Medical Record Number: [id number] Patient Account Number: [id number] Date of Birth/Sex: 30-Oct-1975 (46 y.o. F) Treating RN: Levora Dredge Primary Care Reace Breshears: Raelene Bott Other Clinician: Massie Kluver Referring Braeden Kennan: Raelene Bott Treating Tawnya Pujol/Extender: Yaakov Guthrie in Treatment: 5 Wound Status Wound Number: 11 Primary Open Surgical Wound Etiology: Wound Location: Right, Plantar Foot Wound Open Wounding Event: Surgical Injury Status: Date Acquired: 12/01/2021 Comorbid Chronic sinus problems/congestion, Middle ear problems, Weeks Of Treatment: 5 History: Anemia, Chronic Obstructive Pulmonary Disease (COPD), Clustered Wound: No Congestive Heart Failure, Type II Diabetes, End Stage Renal Disease, History of pressure wounds, Neuropathy Photos Wound Measurements Length: (cm) 2.5 Width: (cm) 0.3 Depth: (cm) 1 Area: (cm) 0.589 Volume: (cm) 0.589 % Reduction in Area: 68.1% % Reduction in Volume: 64.5% Epithelialization: Small (1-33%) Tunneling: No Undermining: No Wound Description Classification: Full Thickness Without Exposed Support Structures Wound Margin: Distinct, outline attached Exudate Amount: Medium Exudate Type: Serosanguineous Exudate Color: red, brown Foul Odor After Cleansing:  No Slough/Fibrino Yes Wound Bed Granulation Amount: Medium (34-66%) Exposed Structure Granulation Quality: Red Fascia Exposed: No Necrotic Amount: Medium (34-66%) Fat Layer (Subcutaneous Tissue) Exposed: Yes Necrotic Quality: Adherent Slough Tendon Exposed: No Muscle Exposed: No Joint Exposed: No Bone Exposed: No Treatment Notes Wound #11 (Foot) Wound Laterality: Plantar, Right Cleanser Soap and Water Discharge Instruction: Gently cleanse wound with antibacterial soap, rinse and pat dry prior to dressing wounds  [name], [name] ([id number]) Peri-Wound Care Topical keystone gel Primary Dressing Aquacel Extra Hydrofiber Dressing, 4x5 (in/in) Discharge Instruction: packed into wound Secondary Dressing ABD Pad 5x9 (in/in) Discharge Instruction: Cover with ABD pad Secured With Dahlgren H Soft Cloth Surgical Tape, 2x2 (in/yd) Kerlix Roll Sterile or Non-Sterile 6-ply 4.5x4 (yd/yd) Discharge Instruction: Apply Kerlix as directed Compression Wrap Compression Stockings Add-Ons Electronic Signature(s) Signed: 04/20/2022 4:35:40 PM By: Levora Dredge Entered By: Levora Dredge on 04/20/2022 11:24:26 [name], [name] ([id number]) -------------------------------------------------------------------------------- Wound Assessment Details Patient Name: Heather Dear A. Date of Service: 04/20/2022 11:15 AM Medical Record Number: [id number] Patient Account Number: [id number] Date of Birth/Sex: 1976-08-02 (46 y.o. F) Treating RN: Levora Dredge Primary Care Drinda Belgard: Raelene Bott Other Clinician: Massie Kluver Referring Skylor Hughson: Raelene Bott Treating Yuriel Lopezmartinez/Extender: Yaakov Guthrie in Treatment: 5 Wound Status Wound Number: 12 Primary Diabetic Wound/Ulcer of the Lower Extremity Etiology: Wound Location: Left, Medial, Plantar Foot Wound Open Wounding Event: Pressure Injury Status: Date Acquired: 03/16/2020 Comorbid Chronic sinus  problems/congestion, Middle ear problems, Weeks Of Treatment: 5 History: Anemia, Chronic Obstructive Pulmonary Disease (COPD), Clustered Wound: No Congestive Heart Failure, Type II Diabetes, End Stage Renal Disease, History of pressure wounds, Neuropathy Photos Wound Measurements Length: (cm) 3.5 Width: (cm) 1.5 Depth: (cm) 0.2 Area: (cm) 4.123 Volume: (cm) 0.825 % Reduction in Area: 12.5% % Reduction in Volume: 12.4% Epithelialization: Small (1-33%) Tunneling: No Undermining: No Wound Description Classification: Grade 3 Wound Margin: Flat and Intact Exudate Amount: Medium Exudate Type: Serous Exudate Color: amber Foul Odor After Cleansing: No Slough/Fibrino Yes Wound Bed Granulation Amount: Medium (34-66%) Exposed Structure Granulation Quality: Red Fascia Exposed: No Necrotic Amount: Medium (34-66%) Fat Layer (Subcutaneous Tissue) Exposed: Yes Necrotic Quality: Adherent Slough Tendon Exposed: No Muscle Exposed: No Joint Exposed: No Bone Exposed: No Treatment Notes Wound #12 (Foot) Wound Laterality: Plantar, Left, Medial Cleanser Soap and Water Discharge Instruction: Gently cleanse wound with antibacterial soap, rinse and pat dry prior to dressing wounds Turnbo, Shalina A. ([id number]) Peri-Wound Care Topical keystone gel Primary Dressing Aquacel Extra Hydrofiber Dressing, 4x5 (in/in) Discharge Instruction: placed over keystone gel Secondary Dressing ABD Pad 5x9 (in/in) Discharge Instruction: Cover with ABD pad Secured With Medipore Tape - 21M Medipore H Soft Cloth Surgical Tape, 2x2 (in/yd) Kerlix Roll Sterile or Non-Sterile 6-ply 4.5x4 (yd/yd) Discharge Instruction: Apply Kerlix as directed Compression Wrap Compression Stockings Add-Ons Electronic Signature(s) Signed: 04/20/2022 4:35:40 PM By: Levora Dredge Entered By: Levora Dredge on 04/20/2022 11:25:06 [name]  ([id number]) -------------------------------------------------------------------------------- Vitals Details Patient Name: Heather Dear A. Date of Service: 04/20/2022 11:15 AM Medical Record Number: [id number] Patient Account Number: [id number] Date of Birth/Sex: 08-Jun-1976 (46 y.o. F) Treating RN: Levora Dredge Primary Care Jazmin Ley: Raelene Bott Other Clinician: Massie Kluver Referring Reinhard Schack: Raelene Bott Treating Betul Brisky/Extender: Yaakov Guthrie in Treatment: 5 Vital Signs Time Taken: 11:14 Temperature (F): 98.3 Pulse (bpm): 97 Respiratory Rate (breaths/min): 18 Blood Pressure (mmHg): 115/70 Reference Range: 80 - 120 mg / dl Electronic Signature(s) Signed: 04/20/2022 4:35:40 PM By: Levora Dredge Entered By: Levora Dredge on 04/20/2022 11:15:33

## 2022-04-27 ENCOUNTER — Ambulatory Visit: Payer: Medicaid Other | Admitting: Internal Medicine

## 2022-05-04 ENCOUNTER — Encounter: Payer: Medicaid Other | Attending: Internal Medicine | Admitting: Internal Medicine

## 2022-05-04 DIAGNOSIS — L97512 Non-pressure chronic ulcer of other part of right foot with fat layer exposed: Secondary | ICD-10-CM | POA: Diagnosis not present

## 2022-05-04 DIAGNOSIS — L97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 of skin: Secondary | ICD-10-CM | POA: Insufficient documentation

## 2022-05-04 DIAGNOSIS — E1142 Type 2 diabetes mellitus with diabetic polyneuropathy: Secondary | ICD-10-CM | POA: Diagnosis not present

## 2022-05-04 DIAGNOSIS — F17210 Nicotine dependence, cigarettes, uncomplicated: Secondary | ICD-10-CM | POA: Insufficient documentation

## 2022-05-04 DIAGNOSIS — L97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ity: Secondary | ICD-10-CM | POA: Insufficient documentation

## 2022-05-04 DIAGNOSIS — E11621 Type 2 diabetes mellitus with foot ulcer: Secondary | ICD-10-CM | POA: Insufficient documentation

## 2022-05-04 NOTE — Progress Notes (Signed)
SHAYLA, HEMING ([id number]) Visit Report for 05/04/2022 Chief Complaint Document Details Patient Name: MCKYNLEIGH, MUSSELL A. Date of Service: 05/04/2022 11:30 AM Medical Record Number: [id number] Patient Account Number: [id number] Date of Birth/Sex: 1976/08/09 (46 y.o. F) Treating RN: Levora Dredge Primary Care Provider: Raelene Bott Other Clinician: Referring Provider: Raelene Bott Treating Provider/Extender: Yaakov Guthrie in Treatment: 7 Information Obtained from: Patient Chief Complaint 03/19/2021; patient referred by Dr. Luana Shu who has been looking after her left foot for quite a period of time for review of a nonhealing area in the left midfoot 03/12/2022; bilateral feet wounds and right lower extremity wound. Electronic Signature(s) Signed: 05/04/2022 12:00:10 PM By: Kalman Shan DO Entered By: Kalman Shan on 05/04/2022 11:55:25 [place], Shyan AMarland Kitchen ([id number]) -------------------------------------------------------------------------------- Debridement Details Patient Name: [name] Date of Service: 05/04/2022 11:30 AM Medical Record Number: [id number] Patient Account Number: [id number] Date of Birth/Sex: 01/31/1976 (46 y.o. F) Treating RN: Levora Dredge Primary Care Provider: Raelene Bott Other Clinician: Referring Provider: Raelene Bott Treating Provider/Extender: Yaakov Guthrie in Treatment: 7 Debridement Performed for Wound #12 Left,Medial,Plantar Foot Assessment: Performed By: Physician Kalman Shan, MD Debridement Type: Debridement Severity of Tissue Pre Debridement: Fat layer exposed Level of Consciousness (Pre- Awake and Alert procedure): Pre-procedure Verification/Time Out Yes - 11:47 Taken: Total Area Debrided (L x W): 3.5 (cm) x 1.5 (cm) = 5.25 (cm) Tissue and other material Viable, Non-Viable, Callus, Slough, Subcutaneous, Slough debrided: Level: Skin/Subcutaneous Tissue Debridement Description:  Excisional Instrument: Curette Bleeding: Minimum Hemostasis Achieved: Pressure Response to Treatment: Procedure was tolerated well Level of Consciousness (Post- Awake and Alert procedure): Post Debridement Measurements of Total Wound Length: (cm) 3.5 Width: (cm) 1.5 Depth: (cm) 0.2 Volume: (cm) 0.825 Character of Wound/Ulcer Post Debridement: Stable Severity of Tissue Post Debridement: Fat layer exposed Post Procedure Diagnosis Same as Pre-procedure Electronic Signature(s) Signed: 05/04/2022 12:00:10 PM By: Kalman Shan DO Signed: 05/04/2022 3:48:03 PM By: Levora Dredge Entered By: Levora Dredge on 05/04/2022 11:49:32 Hancox, Dassel ([id number]) -------------------------------------------------------------------------------- HPI Details Patient Name: [name] Date of Service: 05/04/2022 11:30 AM Medical Record Number: [id number] Patient Account Number: [id number] Date of Birth/Sex: April 28, 1976 (46 y.o. F) Treating RN: Levora Dredge Primary Care Provider: Raelene Bott Other Clinician: Referring Provider: Raelene Bott Treating Provider/Extender: Yaakov Guthrie in Treatment: 7 History of Present Illness HPI Description: 01/18/18-She is here for initial evaluation of the left great toe ulcer. She is a poor historian in regards to timeframe in detail. She states approximately 4 weeks ago she lacerated her toe on something in the house. She followed up with her primary care who placed her on Bactrim and ultimately a second dose of Bactrim prior to coming to wound clinic. She states she has been treating the toe with peroxide, Betadine and a Band-Aid. She did not check her blood sugar this morning but checked it yesterday morning it was 327; she is unaware of a recent A1c and there are no current records. She saw Dr. she would've orthopedics last week for an old injury to the left ankle, she states he did not see her toe, nor did she bring it to his  attention. She smokes approximately 1 pack cigarettes a day. Her social situation is concerning, she arrives this morning with her mother who appears extremely intoxicated/under the influence; her mother was asked to leave the room and be monitored by the patient's grandmother. The patient's aunt then accompanied the patient and the room throughout the rest of the appointment. We had a lengthy discussion regarding  the deleterious effects of uncontrolled hyperglycemia and smoking as it relates to wound healing and overall health. She was strongly encouraged to decrease her smoking and get her diabetes under better control. She states she is currently on a diet and has cut down her [facility] consumption. The left toe is erythematous, macerated and slightly edematous with malodor present. The edema in her left foot is below her baseline, there is no erythema streaking. We will treat her with Santyl, doxycycline; we have ordered and xray, culture and provided a Peg assist surgical shoe and cultured the wound. 01/25/18-She is here in follow-up evaluation for a left great toe ulcer and presents with an abscess to her suprapubic area. She states her blood sugars remain elevated, feeling "sick" and if levels are below 250, but she is trying. She has made no attempt to decrease her smoking stating that we "can't take away her food in her cigarettes". She has been compliant with offloading using the PEG assist you. She is using Santyl daily. the culture obtained last week grew staph aureus and Enterococcus faecalis; continues on the doxycycline and Augmentin was added on Monday. The suprapubic area has erythema, no femoral variation, purple discoloration, minimal induration, was accessed with a cotton tip applicator with sanguinopurulent drainage, this was cultured, I suspect the current antibiotic treatment will cover and we will not add anything to her current treatment plan. She was advised to go to urgent  care or ER with any change in redness, induration or fever. 02/01/18-She is here in follow-up evaluation for left great toe ulcers and a new abdominal abscess from last week. She was able to use packing until earlier this week, where she "forgot it was there". She states she was feeling ill with GI symptoms last week and was not taking her antibiotic. She states her glucose levels have been predominantly less than 200, with occasional levels between 200-250. She thinks this was contributing to her GI symptoms as they have resolved without intervention. There continues to be significant laceration to left toe, otherwise it clinically looks stable/improved. There is now less superficial opening to the lateral aspect of the great toe that was residual blister. We will transition to [facility] to all wounds, she will continue her Augmentin. If there is no change or deterioration next week for reculture. 02/08/18-She is here in follow-up evaluation for left great toe ulcer and abdominal ulcer. There is an improvement in both wounds. She has been wrapping her left toe with coban, not by our direction, which has created an area of discoloration to the medial aspect; she has been advised to NOT use coban secondary to her neuropathy. She states her glucose levels have been high over this last week ranging from 200-350, she continues to smoke. She admits to being less compliant with her offloading shoe. We will continue with same treatment plan and she will follow-up next week. 02/15/18-She is here in follow-up evaluation for left great toe ulcer and abdominal ulcer. The abdominal ulcer is epithelialized. The left great toe ulcer is improved and all injury from last week using the Coban wrap is resolved, the lateral ulcer is healed. She admits to noncompliance with wearing offloading shoe and admits to glucose levels being greater than 300 most of the week. She continues to smoke and expresses no desire to  quit. There is one area medially that probes deeper than it has historically, erythema to the toe and dorsal foot has consistently waxed and waned. There is no  overt signs of cellulitis or infection but we will culture the wound for any occult infection given the new area of depth and erythema. We will hold off on sensitivities for initiation of antibiotic therapy. 02/22/18-She is here in follow up evaluation for left great toe ulcer. There is overall significant improvement in both wound appearance, erythema and edema with changes made last week. She was not initiated on antibiotic therapy. Culture obtained last week showed oxacillin sensitive staph aureus, sensitive to clindamycin. Clindamycin has been called into the pharmacy but she has been instructed to hold off on initiation secondary to overall clinical improvement and her history of antibiotic intolerance. She has been instructed to contact the clinic with any noted changes/deterioration and the wound, erythema, edema and/or pain. She will follow-up next week. She continues to smoke and her glucose levels remain elevated >250; she admits to compliance with offloading shoe 03/01/18 on evaluation today patient appears to be doing fairly well in regard to her left first toe ulcer. She has been tolerating the dressing changes with the [name] Dressing without complication and overall this has definitely showed signs of improvement according to records as well is what the patient tells me today. I'm very pleased in that regard. She is having no pain today 03/08/18 She is here for follow up evaluation of a left great toe ulcer. She remains non-compliant with glucose control and smoking cessation; glucose levels consistently >200. She states that she got new shoe inserts/peg assist. She admits to compliance with offloading. Since my last evaluation there is significant improvement. We will switch to prisma at this time and she will follow up next  week. She is noted to be tachycardic at this appointment, heart rate 120s; she has a history of heart rate 70-130 according to our records. She admits to extreme agitation r/t personal issues; she was advised to monitor her heartrate and contact her physician if it does not return to a more normal range (<100). She takes cardizem twice daily. 03/15/18-She is here in follow-up evaluation for left great toe ulcer. She remains noncompliant with glucose control and smoking cessation. She admits to compliance with wearing offloading shoe. The ulcer is improved/stable and we will continue with the same treatment plan and she will follow-up next week 03/22/18-She is here for evaluation for left great toe ulcer. There continues to be significant improvement despite recurrent hyperglycemia (over 500 yesterday) and she continues to smoke. She has been compliant with offloading and we will continue with same treatment plan and she will follow-up next week. 03/29/18-She is here for evaluation for left great toe ulcer. Despite continuing to smoke and uncontrolled diabetes she continues to improve. She is compliant with offloading shoe. We will continue with the same treatment plan and she will follow-up next week 04/05/18- She is here in follow up evaluation for a left great toe ulcer; she presents with small pustule to left fifth toe (resembles ant bite). She admits to compliance with wearing offloading shoe; continues to smoke or have uncontrolled blood glucose control. There is more callus than usual with evidence of bleeding; she denies known trauma. 04/12/18-She is here for evaluation of left great toe ulcer. Despite noncompliance with glycemic control and smoking she continues to make Schwenn, Raylyn A. ([id number]) improvement. She continues to wear offloading shoe. The pustule, that was identified last week, to the left fifth toe is resolved. She will follow-up in 2 weeks 05/03/18-she is seen in follow-up  evaluation for a left great toe  ulcer. She is compliant with offloading, otherwise noncompliant with glycemic control and smoking. She has plateaued and there is minimal improvement noted. We will transition to [facility], replaced the insert to her surgical shoe and she will follow-up in one week 05/10/18- She is here in follow up evaluation for a left great toe ulcer. It appears stable despite measurement change. We will continue with same treatment plan and follow up next week. 05/24/18-She is seen in follow-up evaluation for a left great toe ulcer. She remains compliant with offloading, has made significant improvement in her diet, decreasing the amount of sugar/soda. She said her recent A1c was 10.9 which is lower than. She did see a diabetic nutritionist/educator yesterday. She continues to smoke. We will continue with the same treatment plan and she'll follow-up next week. 05/31/18- She is seen in follow-up evaluation for left great toe ulcer. She continues to remain compliant with offloading, continues to make improvement in her diet, increasing her water and decreasing the amount of sugar/soda. She does continue to smoke with no desire to quit. We will apply Prisma to the depth and Hydrofera Blue over. We have not received insurance authorization for oasis. She will follow up next week. 06/07/18-She is seen in follow-up evaluation for left great toe ulcer. It has stalled according to today's measurements although base appears stable. She says she saw a diabetic educator yesterday; her average blood sugars are less than 300 which is an improvement for her. She continues to smoke and states "that's my next step" She continues with water over soda. We will order for xray, culture and reinstate ace wrap compression prior to placing apligraf for next week. She is voicing no complaints or concerns. Her dressing will change to iodoflex over the next week in preparation for apligraf. 06/14/18-She is  seen in follow-up evaluation for left great toe ulcer. Plain film x-ray performed last week was negative for osteomyelitis. Wound culture obtained last week grew strep B and OSSA; she is initiated on keflex and cefdinir today; there is erythema to the toe which could be from ace wrap compression, she has a history of wrapping too tight and has has been encouraged to maintain ace wraps that we place today. We will hold off on application of apligraf today, will apply next week after antibiotic therapy has been initiated. She admits today that she has resumed taking a shower with her foot/toe submerged in water, she has been reminded to keep foot/toe out of the bath water. She will be seen in follow up next week 06/21/18-she is seen in follow-up evaluation for left great toe ulcer. She is tolerating antibiotic therapy with no GI disturbance. The wound is stable. Apligraf was applied today. She has been decreasing her smoking, only had 4 cigarettes yesterday and 1 today. She continues being more compliant in diabetic diet. She will follow-up next week for evaluation of site, if stable will remove at 2 weeks. 06/28/18- She is here in follow up evalution. Apligraf was placed last week, she states the dressing fell off on Tuesday and she was dressing with hydrofera blue. She is healed and will be discharged from the clinic today. She has been instructed to continue with smoking cessation, continue monitoring glucose levels, offloading for an additional 4 weeks and continue with hydrofera blue for additional two weeks for any possible microscopic opening. Readmission: 08/07/18 on evaluation today patient presents for reevaluation concerning the ulcer of her right great toe. She was previously discharged on 06/28/18 healed. Nonetheless  she states that this began to show signs of drainage she subsequently went to her primary care provider. Subsequently an x-ray was performed on 08/01/18 which was negative. The  patient was also placed on antibiotics at that time. Fortunately they should have been effective for the infection. Nonetheless she's been experiencing some improvement but still has a lot of drainage coming from the wound itself. 08/14/18 on evaluation today patient's wound actually does show signs of improvement in regard to the erythema at this point. She has completed the antibiotics. With that being said we did discuss the possibility of placing her in a total contact cast as of today although I think that I may want to give this just a little bit more time to ensure nothing recurrence as far as her infection is concerned. I do not want to put in the cast and risk infection at that time if things are not completely resolved. With that being said she is gonna require some debridement today. 08/21/18 on evaluation today patient actually appears to be doing okay in regard to her toe ulcer. She's been tolerating the dressing changes without complication. With that being said it does appear that she is ready and in fact I think it's appropriate for [place] to go ahead and initiate the total contact cast today. Nonetheless she will require some sharp debridement to prepare the wound for application. Overall I feel like things have been progressing well but we do need to do something to get this to close more readily. 08/24/18 patient seen today for reevaluation after having had the total contact cast applied on Tuesday. She seems to have done very well the wound appears to be doing great and overall I'm pleased with the progress that she's made. There were no abnormal areas of rubbing from the cast on her lower extremity. 08/30/18 on evaluation today patient actually appears to be completely healed in regard to her plantar toe ulcer. She tells me at this point she's been having a lot of issues with the cast. She almost fell a couple of times the state shall the step of her dog a couple times as well. This is  been a very frustrating process for her other nonetheless she has completely healed the wound which is excellent news. Overall there does not appear to be the evidence of infection at this time which is great news. 09/11/18 evaluation today patient presents for follow-up concerning her great toe ulcer on the left which has unfortunately reopened since I last saw her which was only a couple of weeks ago. Unfortunately she was not able to get in to get the shoe and potentially the AFO that's gonna be necessary due to her left foot drop. She continues with offloading shoe but this is not enough to prevent her from reopening it appears. When we last had her in the total contact cast she did well from a healing standpoint but unfortunately the wound reopened as soon as she came out of the cast within just a couple of weeks. 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 This is also part of the reason why she falls on a regular basis. I just do not believe that is gonna be safe for [place] to reinitiate the total contact cast as  last time we had this on she fell 3 times one week which is definitely not normal for her. 09/18/18 upon evaluation today the patient actually appears to be doing about the same in regard to her toe ulcer. She did not contact [employer] as I asked her to even though I had given her the prescription. In fact she actually states that she has no idea where the prescription is. She did apparently call [employer] and they told her that all she needed to do was bring the prescription in order to be able to be seen and work on getting the AFO for her left foot. With all that being said she still does not have an appointment and I'm not sure were things stand that regard. I will give her a new prescription  today in order to contact them to get this set up. 09/25/18 on evaluation today patient actually appears to be doing about the same in regard to her toes ulcer. She does have a small areas which seems to have a lot of callous buildup around the edge of the wound which is going to need sharp debridement today. She still is waiting to be scheduled for evaluation with [facility] for possibility of an AFO. She states there supposed to call her tomorrow to get this set up. Unfortunately it does appear that her foot specifically the toe area is showing signs of erythema. There does not appear to be any systemic infection which is in these good news. EXA, BOMBA A. ([id number]) 10/02/18 on evaluation today patient actually appears to be doing about the same in regard to her toe ulcer. This really has not done too well although it's not significantly larger it's also not significantly smaller. She has been tolerating the dressing changes without complication. She actually has her appointment with [employer] and [place] tomorrow to hopefully be measured for obtaining and AFO splint. I think this would be helpful preventing this from reoccurring. We had contemplated starting the cast this week although to be honest I am reluctant to do that as she's been having nausea, vomiting, and seizure activity over the past three days. She has a history of seizures and have been told is nothing that can be done for these. With that being said I do believe that along with the seizures have the nausea vomiting which upon further questioning doesn't seem to be the normal for her and makes me concerned for the possibility of infection or something else going on. I discussed this with the patient and her mother during the office visit today. I do not feel the wound is effective but maybe something else. The responses this was "this just happens to her at times and we don't know why". They did not seem to be interested in going to  the hospital to have this checked out further. 10/09/18 on evaluation today patient presents for follow-up concerning her ongoing toe ulcer. She has been tolerating the dressing changes without complication. Fortunately there does not appear to be any evidence of infection which is great news however I do think that the patient would benefit from going ahead for with the total contact cast. She's actually in a wheelchair today she tells me that she will use her walker if we initiate the cast. I was very specific about the fact that if we were gonna do the cast I wanted to make sure that she was using the walker in order to prevent any falls. She tells me she does not  have stairs that she has to traverse on a regular basis at her home. She has not had any seizures since last week again that something that happens to her often she tells me she did talk to [employer] and they said that it may take up to three weeks to get the brace approved for her. Hopefully that will not take that long but nonetheless in the meantime I do think the cast could be of benefit. 10/12/18 on evaluation today patient appears to be doing rather well in regard to her toe ulcer. It's just been a few days and already this is significantly improved both as far as overall appearance and size. Fortunately there's no sign of infection. She is here for her first obligatory cast change. 10/19/18 Seen today for follow up and management of left great toe ulcer. Wound continues to show improvement. Noted small open area with seroussang drainage with palpation. Denies any increased pain or recent fevers during visit. She will continue calcium alginate with offloading shoe. Denies any questions or concerns during visit. 10/26/18 on evaluation today patient appears to be doing about the same as when I last saw her in regard to her wound bed. Fortunately there does not appear to be any signs of infection. Unfortunately she continues to have a  breakdown in regard to the toe region any time that she is not in the cast. It takes almost no time at all for this to happen. Nonetheless she still has not heard anything from the brace being made by [employer] as to when exactly this will be available to her. Fortunately there is no signs of infection at this time. 10/30/18 on evaluation today patient presents for application of the total contact cast as we just received him this morning. Fortunately we are gonna be able to apply this to her today which is great news. She continues to have no significant pain which is good news. Overall I do feel like things have been improving while she was the cast is when she doesn't have a cast that things get worse. She still has not really heard anything from [place] regarding her brace. 11/02/18 upon evaluation today patient's wound already appears to be doing significantly better which is good news. Fortunately there does not appear to be any signs of infection also good news. Overall I do think the total contact cast as before is helping to heal this area unfortunately it's just not gonna likely keep the area closed and healed without her getting her brace at least. Again the foot drop is a significant issue for her. 11/09/18 on evaluation today patient appears to be doing excellent in regard to her toe ulcer which in fact is completely healed. Fortunately we finally got the situation squared away with the paperwork which was needed to proceed with getting her brace approved by Medicaid. I have filled that out unfortunately that information has been sent to the orthopedic office that I worked at 2 1/2 years ago and not tired Current wound care measures. Fortunately she seems to be doing very well at this time. 11/23/18 on evaluation today patient appears to be doing More Poorly Compared to Last Time I Saw Her. At [facility] She Had Completely Healed. Currently she is continuing to have issues with reopening. She  states that she just found out that the brace was approved through Medicaid now she just has to go get measured in order to have this fitted for her and then made. Subsequently she  does not have an appointment for this yet that is going to complicate things we obviously cannot put her back in the cast if we do not have everything measured because they're not gonna be able to measure her foot while she is in the cast. 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 Obviously this is unfortunate and does have me somewhat worried as well. 11/30/18 on evaluation today patient's toe ulcer actually appears to be doing fairly well. The good news is she will be getting her brace in the shoes next week on Wednesday. Hopefully we will be able to get this to heal without having to go back in the cast however she may need the cast in order to get the wound completely heal and then go from there. Fortunately there's no signs of infection at this time. 12/07/18 on evaluation today patient fortunately did receive her brace and she states she could tell this definitely makes her walk better. With that being said she's been having issues with her toe where she noticed yesterday there was a lot of tissue that was loosing off this appears to be much larger than what it was previous. She also states that her leg has been read putting much across the top of her foot just about the ankle although this seems to be receiving somewhat. The total area is still red and appears to be someone infected as best I can tell. She is previously taken Bactrim and that may be a good option for her today as well. We are gonna see what I wound culture shows as well and I think that this is definitely appropriate. With that being said outside of the culture I still need to initiate something in the interim and that's what I'm gonna go ahead and select  Bactrim is a good option for her. 12/14/18 on evaluation today patient appears to be doing better in regard to her left great toe ulcer as compared to last week's evaluation. There's still some erythema although this is significantly improved which is excellent news. Overall I do believe that she is making good progress is still gonna take some time before she is where I would like her to be from the standpoint of being able to place her back into the total contact cast. Hopefully we will be where we need to be by next week. 12/21/18 on evaluation today patient actually appears to be doing poorly in regard to her toe ulcer. She's been tolerating the dressing changes without complication. Fortunately there's no signs of systemic infection although she does have a lot of drainage from the toe ulcer and this does seem to be causing some issues at this point. She does have erythema on the distal portion of her toe that appears to be likely cellulitis. 12/28/18 on evaluation today patient actually appears to be doing a little better in my pinion in regard to her toe ulcer. With that being said she still does have some evidence of infection at this time and for her culture she had both E. coli as well as enterococcus as organisms noted on evaluation. For that reason I think that though the Keflex likely has treated the E. coli rather well this has really done nothing for the enterococcus. We are going to have to initiate treatment for this specifically. AKAISHA, TRUMAN A. ([id number]) 01/04/19 on evaluation today patient's toe actually  appears to be doing better from the standpoint of infection. She currently would like to see about putting the cash back on I think that this is appropriate as long as she takes care of it and keeps it from getting wet. She is gonna have some drainage we can definitely pass this up with Drawtex and alginate to try to prevent as much drainage as possible from causing the problems.  With that being said I do want to at least try her with the cast between now and Tuesday. If there any issues we can't continue to use it then I will discontinue the use of the cast at that point. 01/08/19 on evaluation today patient actually appears to be doing very well as far as her foot ulcer specifically the great toe on the left is concerned. She did have an area of rubbing on the medial aspect of her left ankle which again is from the cast. Fortunately there's no signs of infection at this point in this appears to be a very slight skin breakdown. The patient tells me she felt it rubbing but didn't think it was that bad. Fortunately there is no signs of active infection at this time which is good news. No fevers, chills, nausea, or vomiting noted at this time. 01/15/19 on evaluation today patient actually appears to be doing well in regard to her toe ulcer. Again as previous she seems to do well and she has the cast on which indicates to me that during the time she doesn't have a cast on she's putting way too much pressure on this region. Obviously I think that's gonna be an issue as with the current national emergency concerning the Covid-19 Virus it has been recommended that we discontinue the use of total contact casting by the chief medical officer of our company, Dr. Simona Huh. The reasoning is that if a patient becomes sick and cannot come into have the cast removed they could not just leave this on for an additional two weeks. Obviously the hospitals also do not want to receive patient's who are sick into the emergency department to potentially contaminate the region and spread the Covid-19 Virus among other sick individuals within the hospital system. Therefore at this point we are suspending the use of total contact cast until the current emergency subsides. This was all discussed with the patient today as well. 01/22/19 on evaluation today patient's wound on her left great toe appears to be  doing slightly worse than previously noted last week. She tells me that she has been on this quite a bit in fact she tells me she's been awake for 38 straight hours. This is due to the fact that she's having to care for grandparents because nobody else will. She has been taking care of them for five the last seven days since I've seen her they both have dementia his is from a stroke and her grandmother's was progressive. Nonetheless she states even her mom who knows her condition and situation has only help two of those days to take care of them she's been taking care of the rest. Fortunately there does not appear to be any signs of active infection in regard to her toe at this point although obviously it doesn't look as good as it did previous. I think this is directly related to her not taking off the pressure and friction by way of taking things easy. Though I completely understand what's going on. 01/29/19 on evaluation today patient's tools are actually  appears to be showing some signs of improvement today compared to last week's evaluation as far as not necessarily the overall size of the wound but the fact that she has some new skin growth in between the two ends of the wound opening. Overall I feel like she has done well she states that she had a family member give her what sounds to be a CAM walker boot which has been helpful as well. 02/05/19 on evaluation today patient's wound bed actually appears to be doing significantly better in regard to her overall appearance of the size of the wound. With that being said she is still having an issue with offloading efficiently enough to get this to close. Apparently there is some signs of infection at this point as well unfortunately. Previously she's done well of Augmentin I really do not see anything that needs to be culture currently but there theme and cellulitis of the foot that I'm seeing I'm gonna go ahead and place her on an antibiotic today to  try to help clear this up. 02/12/2019 on evaluation today patient actually appears to be doing poorly in regard to her overall wound status. She tells me she has been using her offloading shoe but actually comes in today wearing her tennis shoe with the AFO brace. Again as I previously discussed with her this is really not sufficient to allow the area to heal appropriately. Nonetheless she continues to be somewhat noncompliant and I do wonder based on what she has told my nurse in the past as to whether or not a good portion of this noncompliance may be recreational drug and alcohol related. She has had a history of heroin overdose and this was fairly recently in the past couple of months that have been seeing her. Nonetheless overall I feel like her wound looks significantly worse today compared to what it was previous. She still has significant erythema despite the Augmentin I am not sure that this is an appropriate medication for her infection I am also concerned that the infection may have gone down into her bone. 02/19/19 on evaluation today patient actually appears to be doing about the same in regard to her toe ulcer. Unfortunately she continues to show signs of bone exposure and infection at this point. There does not appear to be any evidence of worsening of the infection but I'm also not really sure that it's getting significantly better. She is on the Augmentin which should be sufficient for the Staphylococcus aureus infection that she has at this point. With that being said she may need IV antibiotics to more appropriately treat this. We did have a discussion today about hyperbaric option therapy. 02/28/19 on evaluation today patient actually appears to be doing much worse in regard to the wound on her left great toe as compared to even my previous evaluation last week. Unfortunately this seems to be training in a pretty poor direction. Her toe was actually now starting to angle laterally and  I can actually see the entire joint area of the proximal portion of the digit where is the distal portion of the digit again is no longer even in contact with the joint line. Unfortunately there's a lot more necrotic tissue around the edge and the toe appears to be showing signs of becoming gangrenous in my pinion. I'm very concerned about were things stand at this point. She did see infectious disease and they are planning to send in a prescription for Sivextro for her and apparently  this has been approved. With that being said I don't think she should avoid taking this but at the same time I'm not sure that it's gonna be sufficient to save her toe at this point. 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 03/05/19 on evaluation today patient actually appears to be doing quite well in regard to her toe all things considering. She still has bone exposed but there appears to be much less your thing on overall the appearance of the wound and the toe itself is dramatically improved. She still does have some issues currently obviously with infection she did see vascular as well and there concerned that her blood flow to the toad. For that reason they are setting up for an angiogram next week. 03/14/19 on evaluation today patient appears to be doing very poor in regard to her toe and specifically in regard to the ulceration and the fact that she's starting to notice the toe was leaning even more towards the lateral aspect and the complete joint is visible on the proximal aspect of the joint. Nonetheless she's also noted a significant odor and the tip of the toe is turning more dark and necrotic appearing. Overall I think she is getting worse not better as far as this is concerned. For that reason I am  recommending at this point that she likely needs to be seen for likely amputation. READMISSION 03/19/2021 This is a patient that we cared for in this clinic for a prolonged period of time in 2019 and 2020 with a left foot and left first toe wound. 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 ([id number]) 04/09/20 by Dr. Luana Shu. In December 2021 she had an ulcer on her right great toe as well as the fourth and fifth toes. 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 She also had an angiogram at that time and underwent angioplasty of the right anterior tibial artery. In any case she claims that the wound on the right foot is closed I did not look at this today which was probably an oversight although I think that should be done next week. After her surgery she developed a dehiscence but I do not see any follow-up of this. According to Dr. Deborra Medina last review that she was out of the area being cared for by another physician but recently came back to his attention. 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 A culture of this area showed E. coli apparently before she came back to see Dr. Luana Shu she was supposed to be receiving antibiotics but she did not really take them. Nor is she offloading this area at all. Finally her last hemoglobin A1c listed in epic was in March 2022 at 14.1 she says things are a lot better since then although I am not sure. 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 She had this wound at the time. During this admission she had E. coli sepsis a TEE was negative. Past medical history is extensive and includes type 2 diabetes with peripheral neuropathy cardiomyopathy with an ejection fraction  of 33%, hypertension, hyperlipidemia chronic  renal failure stage III history of substance abuse with cocaine although she claims to be clean now verified by her mother. She is still a heavy cigarette smoker. She has a history of bipolar disorder seizure disorder ABI in our clinic was 1.05 6/1; left midfoot in the setting of a TMA done previously. Round circular wound with a "knuckle" of protruding tissue. The problem is that the knuckle was not attached to any of the surrounding granulation and this probed proximally widely I removed a large portion of this tissue. This wound goes with considerable undermining laterally. I do not feel any bone there was no purulence but this is a deep wound. 6/8; in spite of the debridement I did last week. She arrives with a wound looking exactly the same. A protruding "knuckle" of tissue nonadherent to most of the surrounding tissue. There is considerable depth around this from 6-12 o'clock at 2.7 cm and undermining of 1 cm. This does not look overtly infected and the x-ray I did last week was negative for any osseous abnormalities. We have been using silver collagen 6/15; deep tissue culture I did last week showed moderate staph aureus and moderate Pseudomonas. This will definitely require prolonged antibiotic therapy. The pathology on the protuberant area was negative for malignancy fungus etc. 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 We have been using silver collagen. I am going to be prescribing Levaquin for 2 weeks. Her CT scan of the foot is down for 7/5 6/22; CT scan of the foot on 7 5. She says she has hardware in the left leg from her previous fracture. She is on the Levaquin for the deep tissue culture I did that showed methicillin sensitive staph aureus and Pseudomonas. I gave her a 2-week supply and she will have another week. She arrives in clinic today with the same protuberant tissue however this is nonadherent to the tissue  surrounding it. I am really at a loss to explain this unless there is underlying deep tissue infection 6/29; patient presents for 1 week follow-up. She has been using collagen to the wound bed. She reports taking her antibiotics as prescribed.She has no complaints or issues today. She denies signs of infection. 7/6; patient presents for one week followup. She has been using collagen to the wound bed. She states she is taking Levaquin however at times she is not able to keep it down. She denies signs of infection. 7/13; patient presents for 1 week follow-up. She has been using silver alginate to the wound bed. She still has nausea when taking her antibiotics. She denies signs of infection. 7/20; patient presents for 1 week follow-up. She has been using silver alginate with gentamicin cream to the wound bed. She denies any issues and has no complaints today. She denies signs of infection. 7/27; patient presents for 1 week follow-up. She continues to use silver alginate with gentamicin cream to the wound bed. She reports starting her antibiotics. She has no issues or complaints. Overall she reports stability to the wound. 8/3; patient presents for 1 week follow-up. She has been using silver alginate with gentamicin cream to the wound bed. She reports completing all antibiotics. She has no issues or complaints today. She denies signs of infection. 8/17; patient presents for 2-week follow-up. He is to use silver alginate to the wound bed. She has no issues or complaints today. She denies signs of infection. She reports her pain  has improved in her foot since last clinic visit 8/24; patient presents for 1 week follow-up. She continues to use silver alginate to the wound bed. She has no issues or complaints. She denies signs of infection. Pain is stable. 9/7; patient presents for follow-up. She missed her last week appointment due to feeling ill. She continues to use silver alginate. She has a new wound  to the right lower extremity that is covered in eschar. She states It occurred over the past week and has no idea how it started. She currently denies signs of infection. 9/14; patient presents for follow-up. To the left foot wound she has been using gentamicin cream and silver alginate. To the right lower extremity wound she has been keeping this covered and has not obtain Santyl. 9/21; patient presents for follow-up. She reports using gentamicin cream and silver alginate to the left foot and Santyl to the right lower extremity wound. She has no issues or complaints today. She denies signs of infection. 9/28; patient presents for follow-up. She reports a new wound to her right heel. She states this occurred a few days ago and is progressively gotten worse. She has been trying to clean the area with a Q-tip and Santyl. She reports stability in the other 2 wounds. She has been using gentamicin cream and silver alginate to the left foot and Santyl to the right lower extremity wound. 10/12; patient presents for follow-up. She reports improvement to the wound beds. She is seeing vein and vascular to discuss the potential of a left BKA. She states they are going to do an arteriogram. She continues to use silver alginate with dressing changes to her wounds. 11/2; patient presents for follow-up. She states she has not been doing dressing changes to the wound beds. She states she is not able to offload the areas. She reports chronic pain to her left foot wound. 11/9; patient presents for follow-up. She came in with only socks on. She states she forgot to put on shoes. It is unclear if she is doing any dressing changes. She currently denies systemic signs of infection. 11/16; patient presents for follow-up. She came again only with socks on. She states she does not wear shoes ever. It is unclear if she does dressing changes. She currently denies systemic signs of infection. 11/23; patient presents for  follow-up. She wore her shoes today. It still unclear exactly what dressing she is using for each wound but she did states she obtained Dakin's solution and has been using this to the left foot wound. She currently denies signs of infection. 11/30; patient presents for follow-up. She has no issues or complaints today. She currently denies signs of infection. 12/7; patient presents for follow-up. She has no issues or complaints today. She has been using Hydrofera Blue to the right heel wound and Dakin solution to the left foot wound. Her right anterior leg wound is healed. She currently denies signs of infection. BRITINI, GARCILAZO A. ([id number]) 12/14; patient presents for follow-up. She has been using Hydrofera Blue to the right heel and Dakin's to the left foot wounds. She has no issues or complaints today. She denies signs of infection. 12/21; patient presents for follow-up. She reports using Hydrofera Blue to the right heel and Dakin's to the left foot wound. She denies signs of infection. 12/28; patient presents for follow-up. She continues to use Dakin's to the left foot wound and Hydrofera Blue to the right heel wound. She denies signs of infection. 1/4; patient presents  for follow-up. She has no issues or complaints today. She denies signs of infection. 1/11; patient presents for follow-up. It is unclear if she has been dressing these wounds over the past week. She currently denies signs of infection. 1/18; patient presents for follow-up. She states she has been using Dakin's wet-to-dry dressings to the left foot. She has been using Hydrofera Blue to the right foot foot wound. She states that the anterior right leg wound has reopened and draining serous fluid. She denies signs of infection. 1/25; patient presents for follow-up. She has no issues or complaints today. 2/1; patient presents for follow-up. She has no issues or complaints today. She denies signs of infection. 2/8; patient presents  for follow-up. She has lost her surgical shoes. She did not have a dressing to the right heel wound. She currently denies signs of infection. 2/15; patient presents for follow-up. She reports more pain to the right heel today. She denies purulent drainage Or fever/chills 2/22; patient presents for follow-up. She reports taking clindamycin over the past week. She states that she continues to have pain to her right heel. She reports purulent drainage. Readmission 03/16/2022 Ms. Sharisse Rantz is a 46 year old female with a past medical history of type 2 diabetes, osteomyelitis to her feet, chronic systolic heart failure and bipolar disorder that presents to the clinic for bilateral feet wounds and right lower extremity wound. She was last seen in our clinic on 12/15/2021. At that time she had purulent drainage coming out of her right plantar foot and I recommended she go to the ED. She states she went to [facility] and has been there for the past 3 months. I cannot see the records. She states she had OR debridement and was on several weeks of IV antibiotics while inpatient. Since discharge she has not been taking care of the wound beds. She had nothing on her feet other than socks today. She currently denies signs of infection. 5/31; patient presents for follow-up. She has been using Dakin's wet-to-dry dressings to the wound beds on her feet bilaterally and antibiotic ointment to the right anterior leg wound. She had a wound culture done at last clinic visit that showed moderate Pseudomonas aeruginosa sensitive to ciprofloxacin. She currently denies systemic signs of infection. 6/14; patient presents for follow-up. She received Keystone 5 days ago and has been using this on the wound beds. She states that last week she had to go to the hospital because she had increased warmth and erythema to the right foot. She was started on 2 oral antibiotics. She states she has been taking these. She  currently denies systemic signs of infection. She has no issues or complaints today. 6/21; patient presents for follow-up. She states she has been using Keystone antibiotics to the wound beds. She has no issues or complaints today. She denies signs of infection. 6/28; patient presents for follow-up. She has been using Keystone antibiotics to the wound beds. She has no issues or complaints today. 7/12; patient presents for follow-up. Has been using Keystone antibiotics to the wound beds with calcium alginate. She has no issues or complaints today. She never followed up with her orthopedic surgeon who did the OR debridement to the right foot. We discussed the total contact cast for the left foot and patient would like to do this next week. Electronic Signature(s) Signed: 05/04/2022 12:00:10 PM By: Kalman Shan DO Entered By: Kalman Shan on 05/04/2022 11:56:06 [name] ([id number]) -------------------------------------------------------------------------------- Physical Exam Details Patient Name: [name],  Ebelin A. Date of Service: 05/04/2022 11:30 AM Medical Record Number: [id number] Patient Account Number: [id number] Date of Birth/Sex: 06-16-1976 (46 y.o. F) Treating RN: Levora Dredge Primary Care Provider: Raelene Bott Other Clinician: Referring Provider: Raelene Bott Treating Provider/Extender: Yaakov Guthrie in Treatment: 7 Constitutional . Cardiovascular . Psychiatric . Notes Right foot: To the plantar heel there is an incision site with increased depth and granulation tissue at the opening. No probing to bone. Left foot: To the medial aspect there is an open wound with granulation tissue, Nonviable tissue and circumferential callus. No signs of surrounding infection to any of the wound beds. Electronic Signature(s) Signed: 05/04/2022 12:00:10 PM By: Kalman Shan DO Entered By: Kalman Shan on 05/04/2022 11:56:47 [name]  ([id number]) -------------------------------------------------------------------------------- Physician Orders Details Patient Name: [name] Date of Service: 05/04/2022 11:30 AM Medical Record Number: [id number] Patient Account Number: [id number] Date of Birth/Sex: [birth year] (46 y.o. F) Treating RN: Levora Dredge Primary Care Provider: Raelene Bott Other Clinician: Referring Provider: Raelene Bott Treating Provider/Extender: Yaakov Guthrie in Treatment: 7 Verbal / Phone Orders: No Diagnosis Coding Follow-up Appointments o Return Appointment in 1 week. o Nurse Visit as needed [employer] o Wash wounds with antibacterial soap and water. o No tub bath. Anesthetic (Use 'Patient Medications' Section for Anesthetic Order Entry) o Lidocaine applied to wound bed Edema Control - Lymphedema / Segmental Compressive Device / Other o Elevate, Exercise Daily and Avoid Standing for Long Periods of Time. o Elevate legs to the level of the heart and pump ankles as often as possible o Elevate leg(s) parallel to the floor when sitting. Off-Loading o Other: - keep pressure off of feet. Medications-Please add to medication list. o Keystone Compound - Keystone gel Wound Treatment Wound #11 - Foot Wound Laterality: Plantar, Right Cleanser: Soap and Water 1 x Per Day/30 Days Discharge Instructions: Gently cleanse wound with antibacterial soap, rinse and pat dry prior to dressing wounds Topical: keystone gel 1 x Per Day/30 Days Primary Dressing: Aquacel Extra Hydrofiber Dressing, 4x5 (in/in) (DME) (Generic) 1 x Per Day/30 Days Discharge Instructions: packed into wound Secondary Dressing: ABD Pad 5x9 (in/in) (DME) (Generic) 1 x Per Day/30 Days Discharge Instructions: Cover with ABD pad Secured With: Medipore Tape - 70M Medipore H Soft Cloth Surgical Tape, 2x2 (in/yd) (DME) (Generic) 1 x Per Day/30 Days Secured With: Kerlix Roll Sterile or Non-Sterile  6-ply 4.5x4 (yd/yd) (DME) (Generic) 1 x Per Day/30 Days Discharge Instructions: Apply Kerlix as directed Wound #12 - Foot Wound Laterality: Plantar, Left, Medial Cleanser: Soap and Water 1 x Per Day/30 Days Discharge Instructions: Gently cleanse wound with antibacterial soap, rinse and pat dry prior to dressing wounds Topical: keystone gel 1 x Per Day/30 Days Primary Dressing: Aquacel Extra Hydrofiber Dressing, 4x5 (in/in) (DME) (Generic) 1 x Per Day/30 Days Discharge Instructions: placed over keystone gel Secondary Dressing: ABD Pad 5x9 (in/in) (DME) (Generic) 1 x Per Day/30 Days Discharge Instructions: Cover with ABD pad Secured With: Medipore Tape - 70M Medipore H Soft Cloth Surgical Tape, 2x2 (in/yd) (DME) (Generic) 1 x Per Day/30 Days Secured With: Kerlix Roll Sterile or Non-Sterile 6-ply 4.5x4 (yd/yd) (DME) (Generic) 1 x Per Day/30 Days AICIA, BABINSKI A. ([id number]) Discharge Instructions: Apply Kerlix as directed Electronic Signature(s) Signed: 05/04/2022 1:48:26 PM By: Kalman Shan DO Signed: 05/04/2022 3:48:03 PM By: Levora Dredge Previous Signature: 05/04/2022 12:00:10 PM Version By: Kalman Shan DO Entered By: Levora Dredge on 05/04/2022 12:08:08 [name] ([id number]) -------------------------------------------------------------------------------- Problem List Details Patient Name:  Holten, Tahj A. Date of Service: 05/04/2022 11:30 AM Medical Record Number: [id number] Patient Account Number: [id number] Date of Birth/Sex: 12/28/1975 (46 y.o. 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 ([id number]) -------------------------------------------------------------------------------- Progress Note Details Patient Name: [name] Date of Service: 05/04/2022 11:30 AM Medical Record Number: [id number] Patient Account Number: [id number] Date of Birth/Sex: [birth year] (46 y.o. F) Treating RN: Levora Dredge Primary Care Provider: Raelene Bott Other Clinician: Referring Provider: Raelene Bott Treating Provider/Extender: Yaakov Guthrie in Treatment: 7 Subjective Chief Complaint Information obtained from Patient 03/19/2021; patient referred by Dr. Luana Shu who has been looking after her left foot for quite a period of time for review of a nonhealing area in the left midfoot 03/12/2022; bilateral feet wounds and right lower extremity wound. History of Present Illness (HPI) 01/18/18-She is here for initial evaluation of the left great toe ulcer. She is a poor historian in regards to timeframe in detail. She states approximately 4 weeks ago she lacerated her toe on something in the house. She followed up with her primary care who placed her on Bactrim and ultimately a second dose of Bactrim prior to coming to wound clinic. She states she has been treating the toe with peroxide, Betadine and a Band-Aid. She did not check her blood sugar this morning but checked it yesterday morning it was 327; she is unaware of a recent A1c and there are no current records. She saw Dr. she would've  orthopedics last week for an old injury to the left ankle, she states he did not see her toe, nor did she bring it to his attention. She smokes approximately 1 pack cigarettes a day. Her social situation is concerning, she arrives this morning with her mother who appears extremely intoxicated/under the influence; her mother was asked to leave the room and be monitored by the patient's grandmother. The patient's aunt then accompanied the patient and the room throughout the rest of the appointment. We had a lengthy discussion regarding the deleterious effects of uncontrolled hyperglycemia and smoking as it relates to wound healing and overall health. She was strongly encouraged to decrease her smoking and get her diabetes under better control. She states she is currently on a diet and has cut down her [facility] consumption. The left toe is erythematous, macerated and slightly edematous with malodor present. The edema in her left foot is below her baseline, there is no erythema streaking. We will treat her with Santyl, doxycycline; we have ordered and xray, culture and provided a Peg assist surgical shoe and cultured the wound. 01/25/18-She is here in follow-up evaluation for a left great toe ulcer and presents with an abscess to her suprapubic area. She states her blood sugars remain elevated, feeling "sick" and if levels are below 250,  but she is trying. She has made no attempt to decrease her smoking stating that we "can't take away her food in her cigarettes". She has been compliant with offloading using the PEG assist you. She is using Santyl daily. the culture obtained last week grew staph aureus and Enterococcus faecalis; continues on the doxycycline and Augmentin was added on Monday. The suprapubic area has erythema, no femoral variation, purple discoloration, minimal induration, was accessed with a cotton tip applicator with sanguinopurulent drainage, this was cultured, I suspect the current  antibiotic treatment will cover and we will not add anything to her current treatment plan. She was advised to go to urgent care or ER with any change in redness, induration or fever. 02/01/18-She is here in follow-up evaluation for left great toe ulcers and a new abdominal abscess from last week. She was able to use packing until earlier this week, where she "forgot it was there". She states she was feeling ill with GI symptoms last week and was not taking her antibiotic. She states her glucose levels have been predominantly less than 200, with occasional levels between 200-250. She thinks this was contributing to her GI symptoms as they have resolved without intervention. There continues to be significant laceration to left toe, otherwise it clinically looks stable/improved. There is now less superficial opening to the lateral aspect of the great toe that was residual blister. We will transition to [facility] to all wounds, she will continue her Augmentin. If there is no change or deterioration next week for reculture. 02/08/18-She is here in follow-up evaluation for left great toe ulcer and abdominal ulcer. There is an improvement in both wounds. She has been wrapping her left toe with coban, not by our direction, which has created an area of discoloration to the medial aspect; she has been advised to NOT use coban secondary to her neuropathy. She states her glucose levels have been high over this last week ranging from 200-350, she continues to smoke. She admits to being less compliant with her offloading shoe. We will continue with same treatment plan and she will follow-up next week. 02/15/18-She is here in follow-up evaluation for left great toe ulcer and abdominal ulcer. The abdominal ulcer is epithelialized. The left great toe ulcer is improved and all injury from last week using the Coban wrap is resolved, the lateral ulcer is healed. She admits to noncompliance with wearing offloading shoe  and admits to glucose levels being greater than 300 most of the week. She continues to smoke and expresses no desire to quit. There is one area medially that probes deeper than it has historically, erythema to the toe and dorsal foot has consistently waxed and waned. There is no overt signs of cellulitis or infection but we will culture the wound for any occult infection given the new area of depth and erythema. We will hold off on sensitivities for initiation of antibiotic therapy. 02/22/18-She is here in follow up evaluation for left great toe ulcer. There is overall significant improvement in both wound appearance, erythema and edema with changes made last week. She was not initiated on antibiotic therapy. Culture obtained last week showed oxacillin sensitive staph aureus, sensitive to clindamycin. Clindamycin has been called into the pharmacy but she has been instructed to hold off on initiation secondary to overall clinical improvement and her history of antibiotic intolerance. She has been instructed to contact the clinic with any noted changes/deterioration and the wound, erythema, edema and/or pain. She will follow-up next week.  She continues to smoke and her glucose levels remain elevated >250; she admits to compliance with offloading shoe 03/01/18 on evaluation today patient appears to be doing fairly well in regard to her left first toe ulcer. She has been tolerating the dressing changes with the [facility] Dressing without complication and overall this has definitely showed signs of improvement according to records as well is what the patient tells me today. I'm very pleased in that regard. She is having no pain today 03/08/18 She is here for follow up evaluation of a left great toe ulcer. She remains non-compliant with glucose control and smoking cessation; glucose levels consistently >200. She states that she got new shoe inserts/peg assist. She admits to compliance with offloading. Since  my last evaluation there is significant improvement. We will switch to prisma at this time and she will follow up next week. She is noted to be tachycardic at this appointment, heart rate 120s; she has a history of heart rate 70-130 according to our records. She admits to extreme agitation r/t personal issues; she was advised to monitor her heartrate and contact her physician if it does not return to a more normal range (<100). She takes cardizem twice daily. 03/15/18-She is here in follow-up evaluation for left great toe ulcer. She remains noncompliant with glucose control and smoking cessation. She admits to compliance with wearing offloading shoe. The ulcer is improved/stable and we will continue with the same treatment plan and she will follow-up next week 03/22/18-She is here for evaluation for left great toe ulcer. There continues to be significant improvement despite recurrent hyperglycemia (over 500 yesterday) and she continues to smoke. She has been compliant with offloading and we will continue with same treatment plan and she will follow-up next week. Shammas, Errica A. ([id number]) 03/29/18-She is here for evaluation for left great toe ulcer. Despite continuing to smoke and uncontrolled diabetes she continues to improve. She is compliant with offloading shoe. We will continue with the same treatment plan and she will follow-up next week 04/05/18- She is here in follow up evaluation for a left great toe ulcer; she presents with small pustule to left fifth toe (resembles ant bite). She admits to compliance with wearing offloading shoe; continues to smoke or have uncontrolled blood glucose control. There is more callus than usual with evidence of bleeding; she denies known trauma. 04/12/18-She is here for evaluation of left great toe ulcer. Despite noncompliance with glycemic control and smoking she continues to make improvement. She continues to wear offloading shoe. The pustule, that was  identified last week, to the left fifth toe is resolved. She will follow-up in 2 weeks 05/03/18-she is seen in follow-up evaluation for a left great toe ulcer. She is compliant with offloading, otherwise noncompliant with glycemic control and smoking. She has plateaued and there is minimal improvement noted. We will transition to [facility], replaced the insert to her surgical shoe and she will follow-up in one week 05/10/18- She is here in follow up evaluation for a left great toe ulcer. It appears stable despite measurement change. We will continue with same treatment plan and follow up next week. 05/24/18-She is seen in follow-up evaluation for a left great toe ulcer. She remains compliant with offloading, has made significant improvement in her diet, decreasing the amount of sugar/soda. She said her recent A1c was 10.9 which is lower than. She did see a diabetic nutritionist/educator yesterday. She continues to smoke. We will continue with the same treatment plan and she'll  follow-up next week. 05/31/18- She is seen in follow-up evaluation for left great toe ulcer. She continues to remain compliant with offloading, continues to make improvement in her diet, increasing her water and decreasing the amount of sugar/soda. She does continue to smoke with no desire to quit. We will apply Prisma to the depth and Hydrofera Blue over. We have not received insurance authorization for oasis. She will follow up next week. 06/07/18-She is seen in follow-up evaluation for left great toe ulcer. It has stalled according to today's measurements although base appears stable. She says she saw a diabetic educator yesterday; her average blood sugars are less than 300 which is an improvement for her. She continues to smoke and states "that's my next step" She continues with water over soda. We will order for xray, culture and reinstate ace wrap compression prior to placing apligraf for next week. She is voicing no  complaints or concerns. Her dressing will change to iodoflex over the next week in preparation for apligraf. 06/14/18-She is seen in follow-up evaluation for left great toe ulcer. Plain film x-ray performed last week was negative for osteomyelitis. Wound culture obtained last week grew strep B and OSSA; she is initiated on keflex and cefdinir today; there is erythema to the toe which could be from ace wrap compression, she has a history of wrapping too tight and has has been encouraged to maintain ace wraps that we place today. We will hold off on application of apligraf today, will apply next week after antibiotic therapy has been initiated. She admits today that she has resumed taking a shower with her foot/toe submerged in water, she has been reminded to keep foot/toe out of the bath water. She will be seen in follow up next week 06/21/18-she is seen in follow-up evaluation for left great toe ulcer. She is tolerating antibiotic therapy with no GI disturbance. The wound is stable. Apligraf was applied today. She has been decreasing her smoking, only had 4 cigarettes yesterday and 1 today. She continues being more compliant in diabetic diet. She will follow-up next week for evaluation of site, if stable will remove at 2 weeks. 06/28/18- She is here in follow up evalution. Apligraf was placed last week, she states the dressing fell off on Tuesday and she was dressing with hydrofera blue. She is healed and will be discharged from the clinic today. She has been instructed to continue with smoking cessation, continue monitoring glucose levels, offloading for an additional 4 weeks and continue with hydrofera blue for additional two weeks for any possible microscopic opening. Readmission: 08/07/18 on evaluation today patient presents for reevaluation concerning the ulcer of her right great toe. She was previously discharged on 06/28/18 healed. Nonetheless she states that this began to show signs of drainage  she subsequently went to her primary care provider. Subsequently an x-ray was performed on 08/01/18 which was negative. The patient was also placed on antibiotics at that time. Fortunately they should have been effective for the infection. Nonetheless she's been experiencing some improvement but still has a lot of drainage coming from the wound itself. 08/14/18 on evaluation today patient's wound actually does show signs of improvement in regard to the erythema at this point. She has completed the antibiotics. With that being said we did discuss the possibility of placing her in a total contact cast as of today although I think that I may want to give this just a little bit more time to ensure nothing recurrence as far as her  infection is concerned. I do not want to put in the cast and risk infection at that time if things are not completely resolved. With that being said she is gonna require some debridement today. 08/21/18 on evaluation today patient actually appears to be doing okay in regard to her toe ulcer. She's been tolerating the dressing changes without complication. With that being said it does appear that she is ready and in fact I think it's appropriate for [place] to go ahead and initiate the total contact cast today. Nonetheless she will require some sharp debridement to prepare the wound for application. Overall I feel like things have been progressing well but we do need to do something to get this to close more readily. 08/24/18 patient seen today for reevaluation after having had the total contact cast applied on Tuesday. She seems to have done very well the wound appears to be doing great and overall I'm pleased with the progress that she's made. There were no abnormal areas of rubbing from the cast on her lower extremity. 08/30/18 on evaluation today patient actually appears to be completely healed in regard to her plantar toe ulcer. She tells me at this point she's been having a lot of  issues with the cast. She almost fell a couple of times the state shall the step of her dog a couple times as well. This is been a very frustrating process for her other nonetheless she has completely healed the wound which is excellent news. Overall there does not appear to be the evidence of infection at this time which is great news. 09/11/18 evaluation today patient presents for follow-up concerning her great toe ulcer on the left which has unfortunately reopened since I last saw her which was only a couple of weeks ago. Unfortunately she was not able to get in to get the shoe and potentially the AFO that's gonna be necessary due to her left foot drop. She continues with offloading shoe but this is not enough to prevent her from reopening it appears. When we last had her in the total contact cast she did well from a healing standpoint but unfortunately the wound reopened as soon as she came out of the cast within just a couple of weeks. 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 This is also part of the reason why she falls on a regular basis. I just do not believe that is gonna be safe for [place] to reinitiate the total contact cast as last time we had this on she fell 3 times one week which is definitely not normal for her. 09/18/18 upon evaluation today the patient actually appears to be doing about the same in regard to her toe ulcer. She did not contact [employer] as I asked her to even though I had given her the prescription. In fact she actually states that she has no idea where the prescription is. She did apparently call [employer] and they told her that all she needed to do was bring the prescription in order to be able to be seen and work on getting the AFO for her left foot. With all that being said  she still does not have an appointment and I'm not sure were things stand that regard. I will give her a  new prescription today in order to contact them to get this set up. LUCELY, LEARD A. ([id number]) 09/25/18 on evaluation today patient actually appears to be doing about the same in regard to her toes ulcer. She does have a small areas which seems to have a lot of callous buildup around the edge of the wound which is going to need sharp debridement today. She still is waiting to be scheduled for evaluation with [facility] for possibility of an AFO. She states there supposed to call her tomorrow to get this set up. Unfortunately it does appear that her foot specifically the toe area is showing signs of erythema. There does not appear to be any systemic infection which is in these good news. 10/02/18 on evaluation today patient actually appears to be doing about the same in regard to her toe ulcer. This really has not done too well although it's not significantly larger it's also not significantly smaller. She has been tolerating the dressing changes without complication. She actually has her appointment with [facility] and [facility] tomorrow to hopefully be measured for obtaining and AFO splint. I think this would be helpful preventing this from reoccurring. We had contemplated starting the cast this week although to be honest I am reluctant to do that as she's been having nausea, vomiting, and seizure activity over the past three days. She has a history of seizures and have been told is nothing that can be done for these. With that being said I do believe that along with the seizures have the nausea vomiting which upon further questioning doesn't seem to be the normal for her and makes me concerned for the possibility of infection or something else going on. I discussed this with the patient and her mother during the office visit today. I do not feel the wound is effective but maybe something else. The  responses this was "this just happens to her at times and we don't know why". They did not seem to be interested in going to the hospital to have this checked out further. 10/09/18 on evaluation today patient presents for follow-up concerning her ongoing toe ulcer. She has been tolerating the dressing changes without complication. Fortunately there does not appear to be any evidence of infection which is great news however I do think that the patient would benefit from going ahead for with the total contact cast. She's actually in a wheelchair today she tells me that she will use her walker if we initiate the cast. I was very specific about the fact that if we were gonna do the cast I wanted to make sure that she was using the walker in order to prevent any falls. She tells me she does not have stairs that she has to traverse on a regular basis at her home. She has not had any seizures since last week again that something that happens to her often she tells me she did talk to [employer] and they said that it may take up to three weeks to get the brace approved for her. Hopefully that will not take that long but nonetheless in the meantime I do think the cast could be of benefit. 10/12/18 on evaluation today patient appears to be doing rather well in regard to her toe ulcer. It's just been a few days and already this is significantly improved both as far as overall appearance and size. Fortunately there's no sign of infection. She is here for her first obligatory cast change. 10/19/18 Seen today for follow  up and management of left great toe ulcer. Wound continues to show improvement. Noted small open area with seroussang drainage with palpation. Denies any increased pain or recent fevers during visit. She will continue calcium alginate with offloading shoe. Denies any questions or concerns during visit. 10/26/18 on evaluation today patient appears to be doing about the same as when I last saw her in  regard to her wound bed. Fortunately there does not appear to be any signs of infection. Unfortunately she continues to have a breakdown in regard to the toe region any time that she is not in the cast. It takes almost no time at all for this to happen. Nonetheless she still has not heard anything from the brace being made by [facility] as to when exactly this will be available to her. Fortunately there is no signs of infection at this time. 10/30/18 on evaluation today patient presents for application of the total contact cast as we just received him this morning. Fortunately we are gonna be able to apply this to her today which is great news. She continues to have no significant pain which is good news. Overall I do feel like things have been improving while she was the cast is when she doesn't have a cast that things get worse. She still has not really heard anything from [place] regarding her brace. 11/02/18 upon evaluation today patient's wound already appears to be doing significantly better which is good news. Fortunately there does not appear to be any signs of infection also good news. Overall I do think the total contact cast as before is helping to heal this area unfortunately it's just not gonna likely keep the area closed and healed without her getting her brace at least. Again the foot drop is a significant issue for her. 11/09/18 on evaluation today patient appears to be doing excellent in regard to her toe ulcer which in fact is completely healed. Fortunately we finally got the situation squared away with the paperwork which was needed to proceed with getting her brace approved by Medicaid. I have filled that out unfortunately that information has been sent to the orthopedic office that I worked at 2 1/2 years ago and not tired Current wound care measures. Fortunately she seems to be doing very well at this time. 11/23/18 on evaluation today patient appears to be doing More Poorly Compared to  Last Time I Saw Her. At [facility] She Had Completely Healed. Currently she is continuing to have issues with reopening. She states that she just found out that the brace was approved through Medicaid now she just has to go get measured in order to have this fitted for her and then made. Subsequently she does not have an appointment for this yet that is going to complicate things we obviously cannot put her back in the cast if we do not have everything measured because they're not gonna be able to measure her foot while she is in the cast. 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 Obviously this is unfortunate and does have me somewhat worried as well. 11/30/18 on evaluation today patient's toe ulcer actually appears to be doing fairly well. The good news is she will be getting her brace in the shoes next week on Wednesday. Hopefully we will be able to get this to heal without having to go back in the cast however  she may need the cast in order to get the wound completely heal and then go from there. Fortunately there's no signs of infection at this time. 12/07/18 on evaluation today patient fortunately did receive her brace and she states she could tell this definitely makes her walk better. With that being said she's been having issues with her toe where she noticed yesterday there was a lot of tissue that was loosing off this appears to be much larger than what it was previous. She also states that her leg has been read putting much across the top of her foot just about the ankle although this seems to be receiving somewhat. The total area is still red and appears to be someone infected as best I can tell. She is previously taken Bactrim and that may be a good option for her today as well. We are gonna see what I wound culture shows as well and I think that this is definitely appropriate. With that  being said outside of the culture I still need to initiate something in the interim and that's what I'm gonna go ahead and select Bactrim is a good option for her. 12/14/18 on evaluation today patient appears to be doing better in regard to her left great toe ulcer as compared to last week's evaluation. There's still some erythema although this is significantly improved which is excellent news. Overall I do believe that she is making good progress is still gonna take some time before she is where I would like her to be from the standpoint of being able to place her back into the total contact cast. Hopefully we will be where we need to be by next week. 12/21/18 on evaluation today patient actually appears to be doing poorly in regard to her toe ulcer. She's been tolerating the dressing changes without complication. Fortunately there's no signs of systemic infection although she does have a lot of drainage from the toe ulcer and this does Furnas, Denaisha A. ([id number]) seem to be causing some issues at this point. She does have erythema on the distal portion of her toe that appears to be likely cellulitis. 12/28/18 on evaluation today patient actually appears to be doing a little better in my pinion in regard to her toe ulcer. With that being said she still does have some evidence of infection at this time and for her culture she had both E. coli as well as enterococcus as organisms noted on evaluation. For that reason I think that though the Keflex likely has treated the E. coli rather well this has really done nothing for the enterococcus. We are going to have to initiate treatment for this specifically. 01/04/19 on evaluation today patient's toe actually appears to be doing better from the standpoint of infection. She currently would like to see about putting the cash back on I think that this is appropriate as long as she takes care of it and keeps it from getting wet. She is gonna have some drainage we  can definitely pass this up with Drawtex and alginate to try to prevent as much drainage as possible from causing the problems. With that being said I do want to at least try her with the cast between now and Tuesday. If there any issues we can't continue to use it then I will discontinue the use of the cast at that point. 01/08/19 on evaluation today patient actually appears to be doing very well as far as her foot ulcer specifically the great toe  on the left is concerned. She did have an area of rubbing on the medial aspect of her left ankle which again is from the cast. Fortunately there's no signs of infection at this point in this appears to be a very slight skin breakdown. The patient tells me she felt it rubbing but didn't think it was that bad. Fortunately there is no signs of active infection at this time which is good news. No fevers, chills, nausea, or vomiting noted at this time. 01/15/19 on evaluation today patient actually appears to be doing well in regard to her toe ulcer. Again as previous she seems to do well and she has the cast on which indicates to me that during the time she doesn't have a cast on she's putting way too much pressure on this region. Obviously I think that's gonna be an issue as with the current national emergency concerning the Covid-19 Virus it has been recommended that we discontinue the use of total contact casting by the chief medical officer of our company, Dr. Simona Huh. The reasoning is that if a patient becomes sick and cannot come into have the cast removed they could not just leave this on for an additional two weeks. Obviously the hospitals also do not want to receive patient's who are sick into the emergency department to potentially contaminate the region and spread the Covid-19 Virus among other sick individuals within the hospital system. Therefore at this point we are suspending the use of total contact cast until the current emergency subsides. This was  all discussed with the patient today as well. 01/22/19 on evaluation today patient's wound on her left great toe appears to be doing slightly worse than previously noted last week. She tells me that she has been on this quite a bit in fact she tells me she's been awake for 38 straight hours. This is due to the fact that she's having to care for grandparents because nobody else will. She has been taking care of them for five the last seven days since I've seen her they both have dementia his is from a stroke and her grandmother's was progressive. Nonetheless she states even her mom who knows her condition and situation has only help two of those days to take care of them she's been taking care of the rest. Fortunately there does not appear to be any signs of active infection in regard to her toe at this point although obviously it doesn't look as good as it did previous. I think this is directly related to her not taking off the pressure and friction by way of taking things easy. Though I completely understand what's going on. 01/29/19 on evaluation today patient's tools are actually appears to be showing some signs of improvement today compared to last week's evaluation as far as not necessarily the overall size of the wound but the fact that she has some new skin growth in between the two ends of the wound opening. Overall I feel like she has done well she states that she had a family member give her what sounds to be a CAM walker boot which has been helpful as well. 02/05/19 on evaluation today patient's wound bed actually appears to be doing significantly better in regard to her overall appearance of the size of the wound. With that being said she is still having an issue with offloading efficiently enough to get this to close. Apparently there is some signs of infection at this point as well unfortunately. Previously  she's done well of Augmentin I really do not see anything that needs to be  culture currently but there theme and cellulitis of the foot that I'm seeing I'm gonna go ahead and place her on an antibiotic today to try to help clear this up. 02/12/2019 on evaluation today patient actually appears to be doing poorly in regard to her overall wound status. She tells me she has been using her offloading shoe but actually comes in today wearing her tennis shoe with the AFO brace. Again as I previously discussed with her this is really not sufficient to allow the area to heal appropriately. Nonetheless she continues to be somewhat noncompliant and I do wonder based on what she has told my nurse in the past as to whether or not a good portion of this noncompliance may be recreational drug and alcohol related. She has had a history of heroin overdose and this was fairly recently in the past couple of months that have been seeing her. Nonetheless overall I feel like her wound looks significantly worse today compared to what it was previous. She still has significant erythema despite the Augmentin I am not sure that this is an appropriate medication for her infection I am also concerned that the infection may have gone down into her bone. 02/19/19 on evaluation today patient actually appears to be doing about the same in regard to her toe ulcer. Unfortunately she continues to show signs of bone exposure and infection at this point. There does not appear to be any evidence of worsening of the infection but I'm also not really sure that it's getting significantly better. She is on the Augmentin which should be sufficient for the Staphylococcus aureus infection that she has at this point. With that being said she may need IV antibiotics to more appropriately treat this. We did have a discussion today about hyperbaric option therapy. 02/28/19 on evaluation today patient actually appears to be doing much worse in regard to the wound on her left great toe as compared to even my previous evaluation  last week. Unfortunately this seems to be training in a pretty poor direction. Her toe was actually now starting to angle laterally and I can actually see the entire joint area of the proximal portion of the digit where is the distal portion of the digit again is no longer even in contact with the joint line. Unfortunately there's a lot more necrotic tissue around the edge and the toe appears to be showing signs of becoming gangrenous in my pinion. I'm very concerned about were things stand at this point. She did see infectious disease and they are planning to send in a prescription for Sivextro for her and apparently this has been approved. With that being said I don't think she should avoid taking this but at the same time I'm not sure that it's gonna be sufficient to save her toe at this point. 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 03/05/19 on evaluation today patient actually appears to be doing quite well in regard to her toe all things considering. She still has bone exposed but there appears to be much less your thing on overall the appearance of the wound and the toe itself is dramatically improved. She still does have some  issues currently obviously with infection she did see vascular as well and there concerned that her blood flow to the toad. For that reason they are setting up for an angiogram next week. 03/14/19 on evaluation today patient appears to be doing very poor in regard to her toe and specifically in regard to the ulceration and the fact that she's starting to notice the toe was leaning even more towards the lateral aspect and the complete joint is visible on the proximal aspect of the joint. Nonetheless she's also noted a significant odor and the tip of the toe is turning  more dark and necrotic appearing. Overall I think she is getting worse not better as far as this is concerned. For that reason I am recommending at this point that she likely needs to be seen for likely amputation. JUSTINE, DINES ([id number]) READMISSION 03/19/2021 This is a patient that we cared for in this clinic for a prolonged period of time in 2019 and 2020 with a left foot and left first toe wound. 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 Luana Shu. In December 2021 she had an ulcer on her right great toe as well as the fourth and fifth toes. 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 She also had an angiogram at that time and underwent angioplasty of the right anterior tibial artery. In any case she claims that the wound on the right foot is closed I did not look at this today which was probably an oversight although I think that should be done next week. After her surgery she developed a dehiscence but I do not see any follow-up of this. According to Dr. Deborra Medina last review that she was out of the area being cared for by another physician but recently came back to his attention. 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 A culture of this area showed E. coli apparently before she came back to see Dr. Luana Shu she was supposed to be receiving antibiotics but she did not really take them. Nor is she offloading this area at all. Finally her last hemoglobin A1c listed in epic was in March 2022 at 14.1 she says things are a lot better since then although I am not sure. 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 She had this wound at the time. During this admission she had E. coli sepsis a TEE was negative. 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 She is still a heavy cigarette smoker. She has a history of bipolar disorder seizure disorder ABI in our clinic was 1.05 6/1; left midfoot in the setting of a TMA done previously. Round circular wound with a "knuckle" of protruding tissue. The problem is that the knuckle was not attached to any of the surrounding granulation and this probed proximally widely I removed a large portion of this tissue. This wound goes with considerable undermining laterally. I do not feel any bone there was no purulence but this is a deep wound. 6/8; in spite of the debridement I did last week. She arrives with a wound  looking exactly the same. A protruding "knuckle" of tissue nonadherent to most of the surrounding tissue. There is considerable depth around this from 6-12 o'clock at 2.7 cm and undermining of 1 cm. This does not look overtly infected and the x-ray I did last week was negative for any osseous abnormalities. We have been using silver collagen 6/15; deep tissue culture I did last week showed moderate staph aureus and moderate Pseudomonas. This will definitely require prolonged antibiotic therapy. The pathology on the protuberant area was negative for malignancy fungus etc. 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 We have been using silver collagen. I am going to be prescribing Levaquin for 2 weeks. Her CT scan of the foot is down for 7/5 6/22; CT scan of the foot on 7 5. She says she has hardware in the left leg from her previous fracture. She is on the Levaquin for the deep tissue culture I did that showed methicillin sensitive staph aureus and Pseudomonas. I gave her a 2-week supply and  she will have another week. She arrives in clinic today with the same protuberant tissue however this is nonadherent to the tissue surrounding it. I am really at a loss to explain this unless there is underlying deep tissue infection 6/29; patient presents for 1 week follow-up. She has been using collagen to the wound bed. She reports taking her antibiotics as prescribed.She has no complaints or issues today. She denies signs of infection. 7/6; patient presents for one week followup. She has been using collagen to the wound bed. She states she is taking Levaquin however at times she is not able to keep it down. She denies signs of infection. 7/13; patient presents for 1 week follow-up. She has been using silver alginate to the wound bed. She still has nausea when taking her antibiotics. She denies signs of infection. 7/20; patient presents for 1 week follow-up. She has been using silver alginate with gentamicin cream to the wound bed. She denies any issues and has no complaints today. She denies signs of infection. 7/27; patient presents for 1 week follow-up. She continues to use silver alginate with gentamicin cream to the wound bed. She reports starting her antibiotics. She has no issues or complaints. Overall she reports stability to the wound. 8/3; patient presents for 1 week follow-up. She has been using silver alginate with gentamicin cream to the wound bed. She reports completing all antibiotics. She has no issues or complaints today. She denies signs of infection. 8/17; patient presents for 2-week follow-up. He is to use silver alginate to the wound bed. She has no issues or complaints today. She denies signs of infection. She reports her pain has improved in her foot since last clinic visit 8/24; patient presents for 1 week follow-up. She continues to use silver alginate to the wound bed. She has no issues or complaints. She denies signs of infection. Pain is stable. 9/7; patient presents  for follow-up. She missed her last week appointment due to feeling ill. She continues to use silver alginate. She has a new wound to the right lower extremity that is covered in eschar. She states It occurred over the past week and has no idea how it started. She currently denies signs of infection. 9/14; patient presents for follow-up. To the left foot wound she has been using gentamicin cream and silver alginate. To the right lower extremity wound she has been keeping this covered and has not obtain Santyl. 9/21;  patient presents for follow-up. She reports using gentamicin cream and silver alginate to the left foot and Santyl to the right lower extremity wound. She has no issues or complaints today. She denies signs of infection. 9/28; patient presents for follow-up. She reports a new wound to her right heel. She states this occurred a few days ago and is progressively gotten worse. She has been trying to clean the area with a Q-tip and Santyl. She reports stability in the other 2 wounds. She has been using gentamicin cream and silver alginate to the left foot and Santyl to the right lower extremity wound. 10/12; patient presents for follow-up. She reports improvement to the wound beds. She is seeing vein and vascular to discuss the potential of a left BKA. She states they are going to do an arteriogram. She continues to use silver alginate with dressing changes to her wounds. 11/2; patient presents for follow-up. She states she has not been doing dressing changes to the wound beds. She states she is not able to offload the areas. She reports chronic pain to her left foot wound. 11/9; patient presents for follow-up. She came in with only socks on. She states she forgot to put on shoes. It is unclear if she is doing any dressing changes. She currently denies systemic signs of infection. 11/16; patient presents for follow-up. She came again only with socks on. She states she does not wear shoes ever.  It is unclear if she does dressing changes. She currently denies systemic signs of infection. 11/23; patient presents for follow-up. She wore her shoes today. It still unclear exactly what dressing she is using for each wound but she did states she obtained Dakin's solution and has been using this to the left foot wound. She currently denies signs of infection. OANH, DEVIVO ([id number]) 11/30; patient presents for follow-up. She has no issues or complaints today. She currently denies signs of infection. 12/7; patient presents for follow-up. She has no issues or complaints today. She has been using Hydrofera Blue to the right heel wound and Dakin solution to the left foot wound. Her right anterior leg wound is healed. She currently denies signs of infection. 12/14; patient presents for follow-up. She has been using Hydrofera Blue to the right heel and Dakin's to the left foot wounds. She has no issues or complaints today. She denies signs of infection. 12/21; patient presents for follow-up. She reports using Hydrofera Blue to the right heel and Dakin's to the left foot wound. She denies signs of infection. 12/28; patient presents for follow-up. She continues to use Dakin's to the left foot wound and Hydrofera Blue to the right heel wound. She denies signs of infection. 1/4; patient presents for follow-up. She has no issues or complaints today. She denies signs of infection. 1/11; patient presents for follow-up. It is unclear if she has been dressing these wounds over the past week. She currently denies signs of infection. 1/18; patient presents for follow-up. She states she has been using Dakin's wet-to-dry dressings to the left foot. She has been using Hydrofera Blue to the right foot foot wound. She states that the anterior right leg wound has reopened and draining serous fluid. She denies signs of infection. 1/25; patient presents for follow-up. She has no issues or complaints today. 2/1;  patient presents for follow-up. She has no issues or complaints today. She denies signs of infection. 2/8; patient presents for follow-up. She has lost her surgical shoes. She did not have a dressing  to the right heel wound. She currently denies signs of infection. 2/15; patient presents for follow-up. She reports more pain to the right heel today. She denies purulent drainage Or fever/chills 2/22; patient presents for follow-up. She reports taking clindamycin over the past week. She states that she continues to have pain to her right heel. She reports purulent drainage. Readmission 03/16/2022 Ms. Lawan Nanez is a 46 year old female with a past medical history of type 2 diabetes, osteomyelitis to her feet, chronic systolic heart failure and bipolar disorder that presents to the clinic for bilateral feet wounds and right lower extremity wound. She was last seen in our clinic on 12/15/2021. At that time she had purulent drainage coming out of her right plantar foot and I recommended she go to the ED. She states she went to [facility] and has been there for the past 3 months. I cannot see the records. She states she had OR debridement and was on several weeks of IV antibiotics while inpatient. Since discharge she has not been taking care of the wound beds. She had nothing on her feet other than socks today. She currently denies signs of infection. 5/31; patient presents for follow-up. She has been using Dakin's wet-to-dry dressings to the wound beds on her feet bilaterally and antibiotic ointment to the right anterior leg wound. She had a wound culture done at last clinic visit that showed moderate Pseudomonas aeruginosa sensitive to ciprofloxacin. She currently denies systemic signs of infection. 6/14; patient presents for follow-up. She received Keystone 5 days ago and has been using this on the wound beds. She states that last week she had to go to the hospital because she had increased  warmth and erythema to the right foot. She was started on 2 oral antibiotics. She states she has been taking these. She currently denies systemic signs of infection. She has no issues or complaints today. 6/21; patient presents for follow-up. She states she has been using Keystone antibiotics to the wound beds. She has no issues or complaints today. She denies signs of infection. 6/28; patient presents for follow-up. She has been using Keystone antibiotics to the wound beds. She has no issues or complaints today. 7/12; patient presents for follow-up. Has been using Keystone antibiotics to the wound beds with calcium alginate. She has no issues or complaints today. She never followed up with her orthopedic surgeon who did the OR debridement to the right foot. We discussed the total contact cast for the left foot and patient would like to do this next week. Objective Constitutional Vitals Time Taken: 11:29 AM, Temperature: 98.5 F, Pulse: 64 bpm, Respiratory Rate: 18 breaths/min, Blood Pressure: 115/78 mmHg. General Notes: Right foot: To the plantar heel there is an incision site with increased depth and granulation tissue at the opening. No probing to bone. Left foot: To the medial aspect there is an open wound with granulation tissue, Nonviable tissue and circumferential callus. No signs of Silbaugh, Jenness A. ([id number]) surrounding infection to any of the wound beds. Integumentary (Hair, Skin) Wound #11 status is Open. Original cause of wound was Surgical Injury. The date acquired was: 12/01/2021. The wound has been in treatment 7 weeks. The wound is located on the [place]. The wound measures 2.2cm length x 0.5cm width x 1.2cm depth; 0.864cm^2 area and 1.037cm^3 volume. There is Fat Layer (Subcutaneous Tissue) exposed. There is no tunneling or undermining noted. There is a medium amount of serosanguineous drainage noted. The wound margin is distinct with the  outline attached to the wound  base. There is medium (34-66%) pink, pale granulation within the wound bed. There is a medium (34-66%) amount of necrotic tissue within the wound bed including Adherent Slough. Wound #12 status is Open. Original cause of wound was Pressure Injury. The date acquired was: 03/16/2020. The wound has been in treatment 7 weeks. The wound is located on the [place]. The wound measures 3.5cm length x 1.5cm width x 0.2cm depth; 4.123cm^2 area and 0.825cm^3 volume. There is Fat Layer (Subcutaneous Tissue) exposed. There is no tunneling or undermining noted. There is a medium amount of serous drainage noted. The wound margin is flat and intact. There is large (67-100%) red, pink granulation within the wound bed. There is a small (1-33%) amount of necrotic tissue within the wound bed including Adherent Slough. 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 I debrided nonviable tissue. I recommended continuing Keystone antibiotic and calcium alginate and Aggressive offloading. We discussed the total contact cast for the left foot and patient was agreeable to start this at next clinic visit. Patient was denied coverage for skin substitute. We will reach out to our wound care rep to see if there is anything else to do to try and approve this. She has been approved in the past. Procedures Wound #12 Pre-procedure diagnosis of Wound #12 is a Diabetic Wound/Ulcer of the Lower Extremity located on the Left,Medial,Plantar Foot .Severity of Tissue Pre Debridement is: Fat layer exposed. There was a Excisional Skin/Subcutaneous Tissue Debridement  with a total area of 5.25 sq cm performed by Kalman Shan, MD. With the following instrument(s): Curette to remove Viable and Non-Viable tissue/material. Material removed includes Callus, Subcutaneous Tissue, and Slough. No specimens were taken. A time out was conducted at 11:47, prior to the start of the procedure. A Minimum amount of bleeding was controlled with Pressure. The procedure was tolerated well. Post Debridement Measurements: 3.5cm length x 1.5cm width x 0.2cm depth; 0.825cm^3 volume. Character of Wound/Ulcer Post Debridement is stable. Severity of Tissue Post Debridement is: Fat layer exposed. Post procedure Diagnosis Wound #12: Same as Pre-Procedure Plan 1. In office sharp debridement 2. Keystone antibiotic with calcium alginate 3. Aggressive offloading offloading heel shoe, surgical shoe with peg assist insert 4. Follow-up in 1 week Electronic Signature(s) Signed: 05/04/2022 12:00:10 PM By: Kalman Shan DO Entered By: Kalman Shan on 05/04/2022 11:58:40 [name] ([id number]) -------------------------------------------------------------------------------- ROS/PFSH Details Patient Name: [name] Date of Service: 05/04/2022 11:30 AM Medical Record Number: [id number] Patient Account Number: [id number] Date of Birth/Sex: [birth year] (46 y.o. F) Treating RN: Levora Dredge Primary Care Provider: Raelene Bott Other Clinician: Referring Provider: Raelene Bott Treating Provider/Extender: Yaakov Guthrie in Treatment: 7 Information Obtained From Patient Ear/Nose/Mouth/Throat Medical History: Positive for: Chronic sinus problems/congestion; Middle ear problems Hematologic/Lymphatic Medical History: Positive for: Anemia Respiratory Medical History: Positive for: Chronic Obstructive Pulmonary Disease (COPD) Cardiovascular Medical History: Positive for: Congestive Heart Failure - EF 33% Endocrine Medical History: Positive for: Type II  Diabetes Time with diabetes: 14 years Treated with: Insulin, Oral agents Blood sugar tested every day: No Blood sugar testing results: Bedtime: 176 Genitourinary Medical History: Positive for: End Stage Renal Disease Integumentary (  Skin) Medical History: Positive for: History of pressure wounds Neurologic Medical History: Positive for: Neuropathy - feel and legs HBO Extended History Items Ear/Nose/Mouth/Throat: Ear/Nose/Mouth/Throat: Chronic sinus problems/congestion Middle ear problems Immunizations Pneumococcal Vaccine: Received Pneumococcal Vaccination: No Leib, Terrina A. ([id number]) Implantable Devices No devices added Family and Social History Current every day smoker; Marital Status - Single; Alcohol Use: Never; Drug Use: Prior History; Caffeine Use: Daily Electronic Signature(s) Signed: 05/04/2022 12:00:10 PM By: Kalman Shan DO Signed: 05/04/2022 3:48:03 PM By: Levora Dredge Entered By: Kalman Shan on 05/04/2022 11:59:37 Scott, Zameria AMarland Kitchen ([id number]) -------------------------------------------------------------------------------- SuperBill Details Patient Name: [name] Date of Service: 05/04/2022 Medical Record Number: [id number] Patient Account Number: [id number] Date of Birth/Sex: [birth year] (46 y.o. F) Treating RN: Levora Dredge Primary Care Provider: Raelene Bott Other Clinician: Referring Provider: Raelene Bott Treating Provider/Extender: Yaakov Guthrie in Treatment: 7 Diagnosis Coding ICD-10 Codes Code Description [phone] 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ity 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.621 Type 2 diabetes mellitus with foot ulcer E11.42 Type 2 diabetes mellitus with diabetic polyneuropathy L97.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 of skin Facility Procedures CPT4  Code: 93570177 Description: 93903 - DEB SUBQ TISSUE 20 SQ CM/< Modifier: Quantity: 1 CPT4 Code: Description: ICD-10 Diagnosis Description 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 E11.621 Type 2 diabetes mellitus with foot ulcer Modifier: severity Quantity: Physician Procedures CPT4 Code: 0092330 Description: 11042 - WC PHYS SUBQ TISS 20 SQ CM Modifier: Quantity: 1 CPT4 Code: Description: ICD-10 Diagnosis Description 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 E11.621 Type 2 diabetes mellitus with foot ulcer Modifier: severity Quantity: Electronic Signature(s) Signed: 05/04/2022 12:00:10 PM By: Kalman Shan DO Entered By: Kalman Shan on 05/04/2022 11:59:03

## 2022-05-04 NOTE — Progress Notes (Signed)
[name], [name] ([id number]) Visit Report for 05/04/2022 Arrival Information Details Patient Name: [name], [name] Date of Service: 05/04/2022 11:30 AM Medical Record Number: [id number] Patient Account Number: [id number] Date of Birth/Sex: 03/08/1976 (46 y.o. F) Treating RN: Levora Dredge Primary Care : Raelene Bott Other Clinician: Referring : Raelene Bott Treating /Extender: Yaakov Guthrie in Treatment: 7 Visit Information History Since Last Visit Added or deleted any medications: No Patient Arrived: Wheel Chair Any new allergies or adverse reactions: No Arrival Time: 11:28 Had a fall or experienced change in No Accompanied By: family activities of daily living that may affect Transfer Assistance: EasyPivot Patient Lift risk of falls: Patient Identification Verified: Yes Hospitalized since last visit: No Secondary Verification Process Completed: Yes Has Dressing in Place as Prescribed: Yes Has Footwear/Offloading in Place as Prescribed: Yes Left: Surgical Shoe with Pressure Relief Insole Right: Surgical Shoe with Pressure Relief Insole Pain Present Now: No Electronic Signature(s) Signed: 05/04/2022 3:48:03 PM By: Levora Dredge Entered By: Levora Dredge on 05/04/2022 11:29:25 [name] ([id number]) -------------------------------------------------------------------------------- Clinic Level of Care Assessment Details Patient Name: Heather Dear A. Date of Service: 05/04/2022 11:30 AM Medical Record Number: [id number] Patient Account Number: [id number] Date of Birth/Sex: 1975/11/20 (46 y.o. F) Treating RN: Levora Dredge Primary Care : Raelene Bott Other Clinician: Referring : Raelene Bott Treating /Extender: Yaakov Guthrie in Treatment: 7 Clinic Level of Care Assessment Items TOOL 1 Quantity Score [] - Use when EandM and Procedure is performed on INITIAL visit 0 ASSESSMENTS - Nursing  Assessment / Reassessment [] - General Physical Exam (combine w/ comprehensive assessment (listed just below) when performed on new 0 pt. evals) [] - 0 Comprehensive Assessment (HX, ROS, Risk Assessments, Wounds Hx, etc.) ASSESSMENTS - Wound and Skin Assessment / Reassessment [] - Dermatologic / Skin Assessment (not related to wound area) 0 ASSESSMENTS - Ostomy and/or Continence Assessment and Care [] - Incontinence Assessment and Management 0 [] - 0 Ostomy Care Assessment and Management (repouching, etc.) PROCESS - Coordination of Care [] - Simple Patient / Family Education for ongoing care 0 [] - 0 Complex (extensive) Patient / Family Education for ongoing care [] - 0 Staff obtains Programmer, systems, Records, Test Results / Process Orders [] - 0 Staff telephones HHA, Nursing Homes / Clarify orders / etc [] - 0 Routine Transfer to another Facility (non-emergent condition) [] - 0 Routine Hospital Admission (non-emergent condition) [] - 0 New Admissions / Biomedical engineer / Ordering NPWT, Apligraf, etc. [] - 0 Emergency Hospital Admission (emergent condition) PROCESS - Special Needs [] - Pediatric / Minor Patient Management 0 [] - 0 Isolation Patient Management [] - 0 Hearing / Language / Visual special needs [] - 0 Assessment of Community assistance (transportation, D/C planning, etc.) [] - 0 Additional assistance / Altered mentation [] - 0 Support Surface(s) Assessment (bed, cushion, seat, etc.) INTERVENTIONS - Miscellaneous [] - External ear exam 0 [] - 0 Patient Transfer (multiple staff / Civil Service fast streamer / Similar devices) [] - 0 Simple Staple / Suture removal (25 or less) [] - 0 Complex Staple / Suture removal (26 or more) [] - 0 Hypo/Hyperglycemic Management (do not check if billed separately) [] - 0 Ankle / Brachial Index (ABI) - do not check if billed separately Has the patient been seen at the hospital within the last three years: Yes Total Score: 0 Level Of  Care: ____ [name] ([id number]) Electronic Signature(s) Signed: 05/04/2022 3:48:03 PM By: Levora Dredge Entered By: Levora Dredge on 05/04/2022  12:08:21 [name], [name] ([id number]) -------------------------------------------------------------------------------- Encounter Discharge Information Details Patient Name: [name], [name] Date of Service: 05/04/2022 11:30 AM Medical Record Number: [id number] Patient Account Number: [id number] Date of Birth/Sex: May 12, 1976 (46 y.o. F) Treating RN: Levora Dredge Primary Care : Raelene Bott Other Clinician: Referring : Raelene Bott Treating /Extender: Yaakov Guthrie in Treatment: 7 Encounter Discharge Information Items Post Procedure Vitals Discharge Condition: Stable Temperature (F): 98.5 Ambulatory Status: Wheelchair Pulse (bpm): 64 Discharge Destination: Home Respiratory Rate (breaths/min): 18 Transportation: Private Auto Blood Pressure (mmHg): 115/78 Accompanied By: mother Schedule Follow-up Appointment: Yes Clinical Summary of Care: Electronic Signature(s) Signed: 05/04/2022 3:48:03 PM By: Levora Dredge Entered By: Levora Dredge on 05/04/2022 12:09:57 [name] ([id number]) -------------------------------------------------------------------------------- Lower Extremity Assessment Details Patient Name: Heather Dear A. Date of Service: 05/04/2022 11:30 AM Medical Record Number: [id number] Patient Account Number: [id number] Date of Birth/Sex: 1976-07-13 (46 y.o. F) Treating RN: Levora Dredge Primary Care : Raelene Bott Other Clinician: Referring : Raelene Bott Treating /Extender: Yaakov Guthrie in Treatment: 7 Edema Assessment Assessed: [Left: No] [Right: No] [Left: Edema] [Right: :] Calf Left: Right: Point of Measurement: 29 cm From Medial Instep 30.8 cm 31.1 cm Ankle Left: Right: Point of Measurement: 10 cm From Medial Instep  18.4 cm 18.5 cm Vascular Assessment Pulses: Dorsalis Pedis Palpable: [Left:Yes] [Right:Yes] Electronic Signature(s) Signed: 05/04/2022 3:48:03 PM By: Levora Dredge Entered By: Levora Dredge on 05/04/2022 11:41:55 [name], [name] ([id number]) -------------------------------------------------------------------------------- Multi Wound Chart Details Patient Name: Heather Dear A. Date of Service: 05/04/2022 11:30 AM Medical Record Number: [id number] Patient Account Number: [id number] Date of Birth/Sex: 08-Aug-1976 (46 y.o. 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 Comorbid History: Chronic sinus problems/congestion, Chronic sinus problems/congestion, N/A Middle ear problems, Anemia, Middle ear problems, Anemia, Chronic Obstructive Pulmonary Chronic Obstructive Pulmonary Disease (COPD), Congestive Heart Disease (COPD), Congestive Heart Failure, Type II Diabetes, End Stage Failure, Type II Diabetes, End Stage Renal Disease, History of pressure Renal Disease, History of pressure wounds, Neuropathy wounds, Neuropathy Date Acquired: 12/01/2021 03/16/2020 N/A Weeks of Treatment: 7 7 N/A Wound Status: Open Open N/A Wound Recurrence: No No N/A Measurements L x W x D (cm) 2.2x0.5x1.2 3.5x1.5x0.2 N/A Area (cm) : 0.864 4.123 N/A Volume (cm) : 1.037 0.825 N/A % Reduction in Area: 53.20% 12.50% N/A % Reduction in Volume: 37.60% 12.40% N/A Classification: Full  Thickness Without Exposed Grade 3 N/A Support Structures Exudate Amount: Medium Medium N/A Exudate Type: Serosanguineous Serous N/A Exudate Color: red, brown amber N/A Wound Margin: Distinct, outline attached Flat and Intact N/A Granulation Amount: Medium (34-66%) Large (67-100%) N/A Granulation Quality: Pink, Pale Red, Pink N/A Necrotic Amount: Medium (34-66%) Small (1-33%) N/A Exposed Structures: Fat Layer (Subcutaneous Tissue): Fat Layer (Subcutaneous Tissue): N/A Yes Yes Fascia: No Fascia: No Tendon: No Tendon: No Muscle: No Muscle: No Joint: No Joint: No Bone: No Bone: No Epithelialization: Small (1-33%) Small (1-33%) N/A Treatment Notes [name], [name] ([id number]) Electronic Signature(s) Signed: 05/04/2022 3:48:03 PM By: Levora Dredge Entered By: Levora Dredge on 05/04/2022 11:46:16 [name] ([id number]) -------------------------------------------------------------------------------- [place] Details Patient Name: Heather Dear A. Date of Service: 05/04/2022 11:30  AM Medical Record Number: [id number] Patient Account Number: [id number] Date of Birth/Sex: 1976-09-04 (46 y.o. F) Treating RN: Levora Dredge Primary Care Abagael Kramm: Raelene Bott Other Clinician: Referring Elizbeth Posa: Raelene Bott Treating Carmon Sahli/Extender: Yaakov Guthrie in Treatment: 7 Active Inactive Abuse / Safety / Falls / Self Care Management Nursing Diagnoses: History of Falls Potential for falls Potential for injury related to falls Goals: Patient will not develop complications from immobility Date Initiated: 03/17/2022 Date Inactivated: 04/20/2022 Target Resolution Date: 03/16/2022 Goal Status: Met Patient/caregiver will verbalize understanding of skin care regimen Date Initiated: 03/17/2022 Target Resolution Date: 03/16/2022 Goal Status: Active Patient/caregiver will verbalize/demonstrate measure taken to improve self care Date Initiated:  03/17/2022 Target Resolution Date: 03/16/2022 Goal Status: Active Interventions: Assess fall risk on admission and as needed Provide education on basic hygiene Provide education on personal and home safety Notes: Necrotic Tissue Nursing Diagnoses: Impaired tissue integrity related to necrotic/devitalized tissue Knowledge deficit related to management of necrotic/devitalized tissue Goals: Necrotic/devitalized tissue will be minimized in the wound bed Date Initiated: 03/17/2022 Date Inactivated: 05/04/2022 Target Resolution Date: 03/16/2022 Goal Status: Met Patient/caregiver will verbalize understanding of reason and process for debridement of necrotic tissue Date Initiated: 03/17/2022 Target Resolution Date: 03/16/2022 Goal Status: Active Interventions: Provide education on necrotic tissue and debridement process Treatment Activities: Apply topical anesthetic as ordered : 03/16/2022 Excisional debridement : 03/16/2022 Notes: Osteomyelitis Nursing Diagnoses: Infection: osteomyelitis Knowledge deficit related to disease process and management [name], [name] ([id number]) Goals: Patient/caregiver will verbalize understanding of disease process and disease management Date Initiated: 03/17/2022 Date Inactivated: 04/20/2022 Target Resolution Date: 03/16/2022 Goal Status: Met Patient's osteomyelitis will resolve Date Initiated: 03/17/2022 Target Resolution Date: 03/16/2022 Goal Status: Active Signs and symptoms for osteomyelitis will be recognized and promptly addressed Date Initiated: 03/17/2022 Target Resolution Date: 03/16/2022 Goal Status: Active Interventions: Assess for signs and symptoms of osteomyelitis resolution every visit Provide education on osteomyelitis Treatment Activities: Systemic antibiotics : 03/16/2022 Notes: Wound/Skin Impairment Nursing Diagnoses: Impaired tissue integrity Knowledge deficit related to smoking impact on wound healing Knowledge deficit related to  ulceration/compromised skin integrity Goals: Patient/caregiver will verbalize understanding of skin care regimen Date Initiated: 03/17/2022 Date Inactivated: 04/20/2022 Target Resolution Date: 03/16/2022 Goal Status: Met Ulcer/skin breakdown will have a volume reduction of 30% by week 4 Date Initiated: 03/17/2022 Target Resolution Date: 04/13/2022 Goal Status: Active Ulcer/skin breakdown will have a volume reduction of 50% by week 8 Date Initiated: 03/17/2022 Target Resolution Date: 05/04/2022 Goal Status: Active Ulcer/skin breakdown will have a volume reduction of 80% by week 12 Date Initiated: 03/17/2022 Target Resolution Date: 06/01/2022 Goal Status: Active Ulcer/skin breakdown will heal within 14 weeks Date Initiated: 03/17/2022 Target Resolution Date: 06/15/2022 Goal Status: Active Interventions: Assess ulceration(s) every visit Provide education on ulcer and skin care Treatment Activities: Skin care regimen initiated : 03/16/2022 Notes: Electronic Signature(s) Signed: 05/04/2022 3:48:03 PM By: Levora Dredge Entered By: Levora Dredge on 05/04/2022 12:09:08 [name] ([id number]) -------------------------------------------------------------------------------- Pain Assessment Details Patient Name: Heather Dear A. Date of Service: 05/04/2022 11:30 AM Medical Record Number: [id number] Patient Account Number: [id number] Date of Birth/Sex: 07-May-1976 (46 y.o. F) Treating RN: Levora Dredge Primary Care Richardo Popoff: Raelene Bott Other Clinician: Referring Gwendalynn Eckstrom: Raelene Bott Treating Hanaa Payes/Extender: Yaakov Guthrie in Treatment: 7 Active Problems Location of Pain Severity and Description of Pain Patient Has Paino No Site Locations Rate the pain. Current Pain Level: 0 Pain Management and Medication Current Pain Management: Electronic Signature(s) Signed: 05/04/2022 3:48:03 PM By: Levora Dredge Entered By: Levora Dredge on 05/04/2022 11:31:03  [name], [name] ([id number]) -------------------------------------------------------------------------------- Patient/Caregiver Education Details Patient Name: Heather Dear A. Date of Service: 05/04/2022 11:30 AM Medical Record Number: [id number] Patient Account Number: [id number] Date of Birth/Gender: February 03, 1976 (46 y.o. F) Treating RN: Levora Dredge Primary Care Physician: Raelene Bott Other Clinician: Referring Physician: Raelene Bott Treating Physician/Extender: Yaakov Guthrie in Treatment: 7 Education Assessment Education Provided To: Patient Education Topics Provided Infection: Handouts: Infection Prevention and Management Methods: Explain/Verbal Responses: State content correctly Welcome [facility]: Wound Debridement: Handouts: Wound Debridement Methods: Explain/Verbal Responses: State content correctly Wound/Skin Impairment: Handouts: Caring for Your Ulcer Methods: Explain/Verbal Responses: State content correctly Electronic Signature(s) Signed: 05/04/2022 3:48:03 PM By: Levora Dredge Entered By: Levora Dredge on 05/04/2022 12:08:46 Heather Dear A. ([id number]) -------------------------------------------------------------------------------- Wound Assessment Details Patient Name: Heather Dear A. Date of Service: 05/04/2022 11:30 AM Medical Record Number: [id number] Patient Account Number: [id number] Date of Birth/Sex: [birth year] (46 y.o. F) Treating RN: Levora Dredge Primary Care Ercell Perlman: Raelene Bott Other Clinician: Referring Ozell Ferrera: Raelene Bott Treating Yareni Creps/Extender: Yaakov Guthrie in Treatment: 7 Wound Status Wound Number: 11 Primary Open Surgical Wound Etiology: Wound Location: Right, Plantar Foot Wound Open Wounding Event: Surgical Injury Status: Date Acquired: 12/01/2021 Comorbid Chronic sinus problems/congestion, Middle ear problems, Weeks Of Treatment: 7 History: Anemia, Chronic Obstructive Pulmonary  Disease (COPD), Clustered Wound: No Congestive Heart Failure, Type II Diabetes, End Stage Renal Disease, History of pressure wounds, Neuropathy Photos Wound Measurements Length: (cm) 2.2 Width: (cm) 0.5 Depth: (cm) 1.2 Area: (cm) 0.864 Volume: (cm) 1.037 % Reduction in Area: 53.2% % Reduction in Volume: 37.6% Epithelialization: Small (1-33%) Tunneling: No Undermining: No Wound Description Classification: Full Thickness Without Exposed Support Structures Wound Margin: Distinct, outline attached Exudate Amount: Medium Exudate Type: Serosanguineous Exudate Color: red, brown Foul Odor After Cleansing: No Slough/Fibrino Yes Wound Bed Granulation Amount: Medium (34-66%) Exposed Structure Granulation Quality: Pink, Pale Fascia Exposed: No Necrotic Amount: Medium (34-66%) Fat Layer (Subcutaneous Tissue) Exposed: Yes Necrotic Quality: Adherent Slough Tendon Exposed: No Muscle Exposed: No Joint Exposed: No Bone Exposed: No Treatment Notes Wound #11 (Foot) Wound Laterality: Plantar, Right Cleanser Soap and Water Discharge Instruction: Gently cleanse wound with antibacterial soap, rinse and pat dry prior to dressing wounds Merta, Leilany A. ([id number]) Peri-Wound Care Topical keystone gel Primary Dressing Aquacel Extra Hydrofiber Dressing, 4x5 (in/in) Discharge Instruction: packed into wound Secondary Dressing ABD Pad 5x9 (in/in) Discharge Instruction: Cover with ABD pad Secured With Medipore Tape - 63M Medipore H Soft Cloth Surgical Tape, 2x2 (in/yd) Kerlix Roll Sterile or Non-Sterile 6-ply 4.5x4 (yd/yd) Discharge Instruction: Apply Kerlix as directed Compression Wrap Compression Stockings Add-Ons Electronic Signature(s) Signed: 05/04/2022 3:48:03 PM By: Levora Dredge Entered By: Levora Dredge on 05/04/2022 11:40:54 [name], [name] ([id number]) -------------------------------------------------------------------------------- Wound Assessment Details Patient  Name: Heather Dear A. Date of Service: 05/04/2022 11:30 AM Medical Record Number: [id number] Patient Account Number: [id number] Date of Birth/Sex: [birth year] (46 y.o. F) Treating RN: Levora Dredge Primary Care Cloyde Oregel: Raelene Bott Other Clinician: Referring Joslynne Klatt: Raelene Bott Treating Adaijah Endres/Extender: Yaakov Guthrie in Treatment: 7 Wound Status Wound Number: 12 Primary Diabetic Wound/Ulcer of the Lower Extremity Etiology: Wound Location: Left, Medial, Plantar Foot Wound Open Wounding Event: Pressure Injury Status: Date Acquired: 03/16/2020 Comorbid Chronic sinus problems/congestion, Middle ear problems, Weeks Of Treatment: 7 History: Anemia, Chronic Obstructive Pulmonary Disease (COPD), Clustered Wound: No Congestive Heart Failure, Type II Diabetes, End Stage Renal Disease, History of pressure wounds, Neuropathy Photos Wound Measurements Length: (cm) 3.5 Width: (cm) 1.5 Depth: (cm) 0.2 Area: (cm)  4.123 Volume: (cm) 0.825 % Reduction in Area: 12.5% % Reduction in Volume: 12.4% Epithelialization: Small (1-33%) Tunneling: No Undermining: No Wound Description Classification: Grade 3 Wound Margin: Flat and Intact Exudate Amount: Medium Exudate Type: Serous Exudate Color: amber Foul Odor After Cleansing: No Slough/Fibrino Yes Wound Bed Granulation Amount: Large (67-100%) Exposed Structure Granulation Quality: Red, Pink Fascia Exposed: No Necrotic Amount: Small (1-33%) Fat Layer (Subcutaneous Tissue) Exposed: Yes Necrotic Quality: Adherent Slough Tendon Exposed: No Muscle Exposed: No Joint Exposed: No Bone Exposed: No Treatment Notes Wound #12 (Foot) Wound Laterality: Plantar, Left, Medial Cleanser Soap and Water Discharge Instruction: Gently cleanse wound with antibacterial soap, rinse and pat dry prior to dressing wounds Mccrone, Rand A. ([id number]) Peri-Wound Care Topical keystone gel Primary Dressing Aquacel Extra Hydrofiber  Dressing, 4x5 (in/in) Discharge Instruction: placed over keystone gel Secondary Dressing ABD Pad 5x9 (in/in) Discharge Instruction: Cover with ABD pad Secured With Medipore Tape - 36M Medipore H Soft Cloth Surgical Tape, 2x2 (in/yd) Kerlix Roll Sterile or Non-Sterile 6-ply 4.5x4 (yd/yd) Discharge Instruction: Apply Kerlix as directed Compression Wrap Compression Stockings Add-Ons Electronic Signature(s) Signed: 05/04/2022 3:48:03 PM By: Levora Dredge Entered By: Levora Dredge on 05/04/2022 11:41:27 Spong, Daleyssa A. ([id number]) -------------------------------------------------------------------------------- [place] Details Patient Name: Heather Dear A. Date of Service: 05/04/2022 11:30 AM Medical Record Number: [id number] Patient Account Number: [id number] Date of Birth/Sex: 1976-06-05 (46 y.o. F) Treating RN: Levora Dredge Primary Care Mical Kicklighter: Raelene Bott Other Clinician: Referring Casimira Sutphin: Raelene Bott Treating Deandria Klute/Extender: Yaakov Guthrie in Treatment: 7 Vital Signs Time Taken: 11:29 Temperature (F): 98.5 Pulse (bpm): 64 Respiratory Rate (breaths/min): 18 Blood Pressure (mmHg): 115/78 Reference Range: 80 - 120 mg / dl Electronic Signature(s) Signed: 05/04/2022 3:48:03 PM By: Levora Dredge Entered By: Levora Dredge on 05/04/2022 11:30:28

## 2022-05-11 ENCOUNTER — Encounter (HOSPITAL_BASED_OUTPATIENT_CLINIC_OR_DEPARTMENT_OTHER): Payer: Medicaid Other | Admitting: Internal Medicine

## 2022-05-11 DIAGNOSIS — L97512 Non-pressure chronic ulcer of other part of right foot with fat layer exposed: Secondary | ICD-10-CM | POA: Diagnosis not present

## 2022-05-11 DIAGNOSIS — L97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ity: Secondary | ICD-10-CM | POA: Diagnosis not present

## 2022-05-11 DIAGNOSIS — E11621 Type 2 diabetes mellitus with foot ulcer: Secondary | ICD-10-CM

## 2022-05-11 NOTE — Progress Notes (Signed)
LEMMA, TETRO ([id number]) Visit Report for 05/11/2022 Arrival Information Details Patient Name: [name], [name] Date of Service: 05/11/2022 9:00 AM Medical Record Number: [id number] Patient Account Number: [id number] Date of Birth/Sex: [birth year] (46 y.o. F) Treating RN: Levora Dredge Primary Care Margot Oriordan: Raelene Bott Other Clinician: Massie Kluver Referring Kalynn Declercq: Raelene Bott Treating Tiearra Colwell/Extender: Yaakov Guthrie in Treatment: 8 Visit Information History Since Last Visit All ordered tests and consults were completed: No Patient Arrived: Wheel Chair Added or deleted any medications: No Arrival Time: 09:01 Any new allergies or adverse reactions: No Transfer Assistance: EasyPivot Patient Lift Had a fall or experienced change in No activities of daily living that may affect risk of falls: Hospitalized since last visit: No Pain Present Now: Yes Electronic Signature(s) Signed: 05/11/2022 4:21:04 PM By: Massie Kluver Entered By: Massie Kluver on 05/11/2022 09:02:04 [name] ([id number]) -------------------------------------------------------------------------------- Clinic Level of Care Assessment Details Patient Name: Heather Dear A. Date of Service: 05/11/2022 9:00 AM Medical Record Number: [id number] Patient Account Number: [id number] Date of Birth/Sex: Mar 05, 1976 (46 y.o. F) Treating RN: Levora Dredge Primary Care Blayde Bacigalupi: Raelene Bott Other Clinician: Massie Kluver Referring Hussam Muniz: Raelene Bott Treating Celester Lech/Extender: Yaakov Guthrie in Treatment: 8 Clinic Level of Care Assessment Items TOOL 1 Quantity Score []  - Use when EandM and Procedure is performed on INITIAL visit 0 ASSESSMENTS - Nursing Assessment / Reassessment []  - General Physical Exam (combine w/ comprehensive assessment (listed just below) when performed on new 0 pt. evals) []  - 0 Comprehensive Assessment (HX, ROS, Risk Assessments, Wounds Hx,  etc.) ASSESSMENTS - Wound and Skin Assessment / Reassessment []  - Dermatologic / Skin Assessment (not related to wound area) 0 ASSESSMENTS - Ostomy and/or Continence Assessment and Care []  - Incontinence Assessment and Management 0 []  - 0 Ostomy Care Assessment and Management (repouching, etc.) PROCESS - Coordination of Care []  - Simple Patient / Family Education for ongoing care 0 []  - 0 Complex (extensive) Patient / Family Education for ongoing care []  - 0 Staff obtains Programmer, systems, Records, Test Results / Process Orders []  - 0 Staff telephones HHA, Nursing Homes / Clarify orders / etc []  - 0 Routine Transfer to another Facility (non-emergent condition) []  - 0 Routine Hospital Admission (non-emergent condition) []  - 0 New Admissions / Biomedical engineer / Ordering NPWT, Apligraf, etc. []  - 0 Emergency Hospital Admission (emergent condition) PROCESS - Special Needs []  - Pediatric / Minor Patient Management 0 []  - 0 Isolation Patient Management []  - 0 Hearing / Language / Visual special needs []  - 0 Assessment of Community assistance (transportation, D/C planning, etc.) []  - 0 Additional assistance / Altered mentation []  - 0 Support Surface(s) Assessment (bed, cushion, seat, etc.) INTERVENTIONS - Miscellaneous []  - External ear exam 0 []  - 0 Patient Transfer (multiple staff / Civil Service fast streamer / Similar devices) []  - 0 Simple Staple / Suture removal (25 or less) []  - 0 Complex Staple / Suture removal (26 or more) []  - 0 Hypo/Hyperglycemic Management (do not check if billed separately) []  - 0 Ankle / Brachial Index (ABI) - do not check if billed separately Has the patient been seen at the hospital within the last three years: Yes Total Score: 0 Level Of Care: ____ [name] ([id number]) Electronic Signature(s) Signed: 05/11/2022 4:21:04 PM By: Massie Kluver Entered By: Massie Kluver on 05/11/2022 09:52:42 Ridley, Larrie AMarland Kitchen  ([id number]) -------------------------------------------------------------------------------- Encounter Discharge Information Details Patient Name: Heather Dear A. Date of Service: 05/11/2022 9:00 AM Medical Record Number: [id number] Patient Account Number:  [id number] Date of Birth/Sex: [birth year] (46 y.o. F) Treating RN: Levora Dredge Primary Care Deira Shimer: Raelene Bott Other Clinician: Massie Kluver Referring Tremar Wickens: Raelene Bott Treating Kandance Yano/Extender: Yaakov Guthrie in Treatment: 8 Encounter Discharge Information Items Post Procedure Vitals Discharge Condition: Stable Temperature (F): 98.3 Ambulatory Status: Wheelchair Pulse (bpm): 65 Discharge Destination: Home Respiratory Rate (breaths/min): 18 Transportation: Private Auto Blood Pressure (mmHg): 119/77 Accompanied By: mother Schedule Follow-up Appointment: Yes Clinical Summary of Care: Electronic Signature(s) Signed: 05/11/2022 4:21:04 PM By: Massie Kluver Entered By: Massie Kluver on 05/11/2022 10:24:51 Clouse, Jiali A. ([id number]) -------------------------------------------------------------------------------- Lower Extremity Assessment Details Patient Name: Heather Dear A. Date of Service: 05/11/2022 9:00 AM Medical Record Number: [id number] Patient Account Number: [id number] Date of Birth/Sex: 05-08-1976 (46 y.o. F) Treating RN: Levora Dredge Primary Care Obrien Huskins: Raelene Bott Other Clinician: Massie Kluver Referring Derrik Mceachern: Raelene Bott Treating Demone Lyles/Extender: Yaakov Guthrie in Treatment: 8 Electronic Signature(s) Signed: 05/11/2022 3:19:57 PM By: Levora Dredge Signed: 05/11/2022 4:21:04 PM By: Massie Kluver Entered By: Massie Kluver on 05/11/2022 09:18:15 Pickart, Chellsie A. ([id number]) -------------------------------------------------------------------------------- Multi Wound Chart Details Patient Name: Heather Dear A. Date of Service: 05/11/2022 9:00  AM Medical Record Number: [id number] Patient Account Number: [id number] Date of Birth/Sex: 28-Jan-1976 (46 y.o. 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 Comorbid History: Chronic sinus problems/congestion, Chronic sinus problems/congestion, N/A Middle ear problems, Anemia, Middle ear problems, Anemia, Chronic Obstructive Pulmonary Chronic Obstructive Pulmonary Disease (COPD), Congestive Heart Disease (COPD), Congestive Heart Failure, Type II Diabetes, End Stage Failure, Type II Diabetes, End Stage Renal Disease, History of pressure Renal Disease, History of pressure wounds, Neuropathy wounds, Neuropathy Date Acquired: 12/01/2021 03/16/2020 N/A Weeks of Treatment: 8 8 N/A Wound Status: Open Open N/A Wound Recurrence: No No N/A Measurements L x W x D (cm) 2.1x0.5x0.7 3.6x1.5x0.2 N/A Area (cm) : 0.825 4.241 N/A Volume (cm) : 0.577 0.848 N/A % Reduction in Area: 55.30% 10.00% N/A % Reduction in Volume: 65.30% 10.00% N/A Classification: Full Thickness Without Exposed Grade 3 N/A Support Structures Exudate Amount: Medium Medium N/A Exudate Type: Serosanguineous Serous N/A Exudate Color: red, brown amber N/A Wound Margin: Distinct, outline attached Flat and Intact N/A Granulation Amount: Medium (34-66%) Large (67-100%) N/A Granulation Quality: Pink, Pale Red, Pink N/A Necrotic Amount: Medium (34-66%) Small (1-33%) N/A Exposed Structures: Fat  Layer (Subcutaneous Tissue): Fat Layer (Subcutaneous Tissue): N/A Yes Yes Fascia: No Fascia: No Tendon: No Tendon: No Muscle: No Muscle: No Joint: No Joint: No Bone: No Bone: No Epithelialization: Small (1-33%) Small (1-33%) N/A Debridement: Debridement - Excisional Debridement - Excisional N/A Pre-procedure Verification/Time 09:47 09:38 N/A Out Taken: Tissue Debrided: Callus, Subcutaneous, Slough Callus, Subcutaneous, Slough N/A Level: Skin/Subcutaneous Tissue Skin/Subcutaneous Tissue N/A Debridement Area (sq cm): 2.5 11.25 N/A Ballentine, Sameka A. ([id number]) Instrument: Curette Curette N/A Bleeding: Minimum Minimum N/A Hemostasis Achieved: Pressure Pressure N/A Debridement Treatment Procedure was tolerated well Procedure was tolerated well N/A Response: Post Debridement 2.1x0.5x0.7 3.8x1.6x0.2 N/A Measurements L x W x D (cm) Post Debridement Volume: 0.577 0.955 N/A (cm) Procedures Performed: Debridement Debridement N/A Treatment Notes Electronic Signature(s) Signed: 05/11/2022 4:21:04 PM By: Massie Kluver Entered By: Massie Kluver  on 05/11/2022 09:51:36 HANAH, MOULTRY ([id number]) -------------------------------------------------------------------------------- [place] Details Patient Name: AZRIELLA, MATTIA A. Date of Service: 05/11/2022 9:00 AM Medical Record Number: [id number] Patient Account Number: [id number] Date of Birth/Sex: Oct 13, 1976 (46 y.o. F) Treating RN: Levora Dredge Primary Care Ashyah Quizon: Raelene Bott Other Clinician: Massie Kluver Referring Brennden Masten: Raelene Bott Treating Feleica Fulmore/Extender: Yaakov Guthrie in Treatment: 8 Active Inactive Abuse / Safety / Falls / Self Care Management Nursing Diagnoses: History of Falls Potential for falls Potential for injury related to falls Goals: Patient will not develop complications from immobility Date Initiated: 03/17/2022 Date Inactivated: 04/20/2022 Target Resolution Date:  03/16/2022 Goal Status: Met Patient/caregiver will verbalize understanding of skin care regimen Date Initiated: 03/17/2022 Target Resolution Date: 03/16/2022 Goal Status: Active Patient/caregiver will verbalize/demonstrate measure taken to improve self care Date Initiated: 03/17/2022 Target Resolution Date: 03/16/2022 Goal Status: Active Interventions: Assess fall risk on admission and as needed Provide education on basic hygiene Provide education on personal and home safety Notes: Necrotic Tissue Nursing Diagnoses: Impaired tissue integrity related to necrotic/devitalized tissue Knowledge deficit related to management of necrotic/devitalized tissue Goals: Necrotic/devitalized tissue will be minimized in the wound bed Date Initiated: 03/17/2022 Date Inactivated: 05/04/2022 Target Resolution Date: 03/16/2022 Goal Status: Met Patient/caregiver will verbalize understanding of reason and process for debridement of necrotic tissue Date Initiated: 03/17/2022 Target Resolution Date: 03/16/2022 Goal Status: Active Interventions: Provide education on necrotic tissue and debridement process Treatment Activities: Apply topical anesthetic as ordered : 03/16/2022 Excisional debridement : 03/16/2022 Notes: Osteomyelitis Nursing Diagnoses: Infection: osteomyelitis Knowledge deficit related to disease process and management GIAH, FICKETT ([id number]) Goals: Patient/caregiver will verbalize understanding of disease process and disease management Date Initiated: 03/17/2022 Date Inactivated: 04/20/2022 Target Resolution Date: 03/16/2022 Goal Status: Met Patient's osteomyelitis will resolve Date Initiated: 03/17/2022 Target Resolution Date: 03/16/2022 Goal Status: Active Signs and symptoms for osteomyelitis will be recognized and promptly addressed Date Initiated: 03/17/2022 Target Resolution Date: 03/16/2022 Goal Status: Active Interventions: Assess for signs and symptoms of osteomyelitis  resolution every visit Provide education on osteomyelitis Treatment Activities: Systemic antibiotics : 03/16/2022 Notes: Wound/Skin Impairment Nursing Diagnoses: Impaired tissue integrity Knowledge deficit related to smoking impact on wound healing Knowledge deficit related to ulceration/compromised skin integrity Goals: Patient/caregiver will verbalize understanding of skin care regimen Date Initiated: 03/17/2022 Date Inactivated: 04/20/2022 Target Resolution Date: 03/16/2022 Goal Status: Met Ulcer/skin breakdown will have a volume reduction of 30% by week 4 Date Initiated: 03/17/2022 Target Resolution Date: 04/13/2022 Goal Status: Active Ulcer/skin breakdown will have a volume reduction of 50% by week 8 Date Initiated: 03/17/2022 Target Resolution Date: 05/04/2022 Goal Status: Active Ulcer/skin breakdown will have a volume reduction of 80% by week 12 Date Initiated: 03/17/2022 Target Resolution Date: 06/01/2022 Goal Status: Active Ulcer/skin breakdown will heal within 14 weeks Date Initiated: 03/17/2022 Target Resolution Date: 06/15/2022 Goal Status: Active Interventions: Assess ulceration(s) every visit Provide education on ulcer and skin care Treatment Activities: Skin care regimen initiated : 03/16/2022 Notes: Electronic Signature(s) Signed: 05/11/2022 3:19:57 PM By: Levora Dredge Signed: 05/11/2022 4:21:04 PM By: Massie Kluver Entered By: Massie Kluver on 05/11/2022 09:18:21 [name] ([id number]) -------------------------------------------------------------------------------- Pain Assessment Details Patient Name: Heather Dear A. Date of Service: 05/11/2022 9:00 AM Medical Record Number: [id number] Patient Account Number: [id number] Date of Birth/Sex: [birth year] (46 y.o. F) Treating RN: Levora Dredge Primary Care Lyndell Allaire: Raelene Bott Other Clinician: Massie Kluver Referring Reeda Soohoo: Raelene Bott Treating Lawrence Roldan/Extender: Yaakov Guthrie in  Treatment: 8 Active Problems Location of Pain Severity and Description of Pain Patient Has  Paino Yes Site Locations Pain Location: Pain in Ulcers Duration of the Pain. Constant / Intermittento Constant Rate the pain. Current Pain Level: 6 Character of Pain Describe the Pain: Burning, Sharp Pain Management and Medication Current Pain Management: Medication: Yes Rest: Yes Electronic Signature(s) Signed: 05/11/2022 3:19:57 PM By: Levora Dredge Signed: 05/11/2022 4:21:04 PM By: Massie Kluver Entered By: Massie Kluver on 05/11/2022 [place], [place] ([id number]) -------------------------------------------------------------------------------- Patient/Caregiver Education Details Patient Name: Heather Dear A. Date of Service: 05/11/2022 9:00 AM Medical Record Number: [id number] Patient Account Number: [id number] Date of Birth/Gender: 06/20/1976 (46 y.o. F) Treating RN: Levora Dredge Primary Care Physician: Raelene Bott Other Clinician: Massie Kluver Referring Physician: Raelene Bott Treating Physician/Extender: Yaakov Guthrie in Treatment: 8 Education Assessment Education Provided To: Patient Education Topics Provided Offloading: Handouts: Other: contact cast care Methods: Explain/Verbal Responses: State content correctly Electronic Signature(s) Signed: 05/11/2022 4:21:04 PM By: Massie Kluver Entered By: Massie Kluver on 05/11/2022 09:53:19 Blevens, Anandi A. ([id number]) -------------------------------------------------------------------------------- Wound Assessment Details Patient Name: Heather Dear A. Date of Service: 05/11/2022 9:00 AM Medical Record Number: [id number] Patient Account Number: [id number] Date of Birth/Sex: 26-Jan-1976 (46 y.o. F) Treating RN: Levora Dredge Primary Care Kalyn Hofstra: Raelene Bott Other Clinician: Massie Kluver Referring Joziyah Roblero: Raelene Bott Treating Tarissa Kerin/Extender: Yaakov Guthrie in Treatment:  8 Wound Status Wound Number: 11 Primary Open Surgical Wound Etiology: Wound Location: Right, Plantar Foot Wound Open Wounding Event: Surgical Injury Status: Date Acquired: 12/01/2021 Comorbid Chronic sinus problems/congestion, Middle ear problems, Weeks Of Treatment: 8 History: Anemia, Chronic Obstructive Pulmonary Disease (COPD), Clustered Wound: No Congestive Heart Failure, Type II Diabetes, End Stage Renal Disease, History of pressure wounds, Neuropathy Photos Wound Measurements Length: (cm) 2.1 Width: (cm) 0.5 Depth: (cm) 0.7 Area: (cm) 0.825 Volume: (cm) 0.577 % Reduction in Area: 55.3% % Reduction in Volume: 65.3% Epithelialization: Small (1-33%) Wound Description Classification: Full Thickness Without Exposed Support Structures Wound Margin: Distinct, outline attached Exudate Amount: Medium Exudate Type: Serosanguineous Exudate Color: red, brown Foul Odor After Cleansing: No Slough/Fibrino Yes Wound Bed Granulation Amount: Medium (34-66%) Exposed Structure Granulation Quality: Pink, Pale Fascia Exposed: No Necrotic Amount: Medium (34-66%) Fat Layer (Subcutaneous Tissue) Exposed: Yes Necrotic Quality: Adherent Slough Tendon Exposed: No Muscle Exposed: No Joint Exposed: No Bone Exposed: No Treatment Notes Wound #11 (Foot) Wound Laterality: Plantar, Right Cleanser Soap and Water Discharge Instruction: Gently cleanse wound with antibacterial soap, rinse and pat dry prior to dressing wounds [place], Sita A. ([id number]) Peri-Wound Care Topical keystone gel Primary Dressing Aquacel Extra Hydrofiber Dressing, 4x5 (in/in) Discharge Instruction: packed into wound Secondary Dressing ABD Pad 5x9 (in/in) Discharge Instruction: Cover with ABD pad Secured With Medipore Tape - 75M Medipore H Soft Cloth Surgical Tape, 2x2 (in/yd) Kerlix Roll Sterile or Non-Sterile 6-ply 4.5x4 (yd/yd) Discharge Instruction: Apply Kerlix as directed Compression Wrap Compression  Stockings Add-Ons Electronic Signature(s) Signed: 05/11/2022 3:19:57 PM By: Levora Dredge Signed: 05/11/2022 4:21:04 PM By: Massie Kluver Entered By: Massie Kluver on 05/11/2022 09:17:28 [name] ([id number]) -------------------------------------------------------------------------------- Wound Assessment Details Patient Name: Heather Dear A. Date of Service: 05/11/2022 9:00 AM Medical Record Number: [id number] Patient Account Number: [id number] Date of Birth/Sex: April 26, 1976 (46 y.o. F) Treating RN: Levora Dredge Primary Care Antionio Negron: Raelene Bott Other Clinician: Massie Kluver Referring Yukio Bisping: Raelene Bott Treating Hilary Pundt/Extender: Yaakov Guthrie in Treatment: 8 Wound Status Wound Number: 12 Primary Diabetic Wound/Ulcer of the Lower Extremity Etiology: Wound Location: Left, Medial, Plantar Foot Wound Open Wounding Event: Pressure Injury Status: Date Acquired: 03/16/2020 Comorbid Chronic sinus problems/congestion, Middle ear  problems, Weeks Of Treatment: 8 History: Anemia, Chronic Obstructive Pulmonary Disease (COPD), Clustered Wound: No Congestive Heart Failure, Type II Diabetes, End Stage Renal Disease, History of pressure wounds, Neuropathy Photos Wound Measurements Length: (cm) 3.6 Width: (cm) 1.5 Depth: (cm) 0.2 Area: (cm) 4.241 Volume: (cm) 0.848 % Reduction in Area: 10% % Reduction in Volume: 10% Epithelialization: Small (1-33%) Wound Description Classification: Grade 3 Wound Margin: Flat and Intact Exudate Amount: Medium Exudate Type: Serous Exudate Color: amber Foul Odor After Cleansing: No Slough/Fibrino Yes Wound Bed Granulation Amount: Large (67-100%) Exposed Structure Granulation Quality: Red, Pink Fascia Exposed: No Necrotic Amount: Small (1-33%) Fat Layer (Subcutaneous Tissue) Exposed: Yes Necrotic Quality: Adherent Slough Tendon Exposed: No Muscle Exposed: No Joint Exposed: No Bone Exposed: No Treatment  Notes Wound #12 (Foot) Wound Laterality: Plantar, Left, Medial Cleanser Soap and Water Discharge Instruction: Gently cleanse wound with antibacterial soap, rinse and pat dry prior to dressing wounds Clear, Rudean A. ([id number]) Peri-Wound Care Topical keystone gel Discharge Instruction: apply very thin layer to wound Primary Dressing Aquacel Extra Hydrofiber Dressing, 4x5 (in/in) Discharge Instruction: placed over keystone gel Secondary Dressing ABD Pad 5x9 (in/in) Discharge Instruction: Cover with ABD pad Secured With Medipore Tape - 16M Medipore H Soft Cloth Surgical Tape, 2x2 (in/yd) Kerlix Roll Sterile or Non-Sterile 6-ply 4.5x4 (yd/yd) Discharge Instruction: Apply Kerlix as directed Compression Wrap Compression Stockings Add-Ons Electronic Signature(s) Signed: 05/11/2022 3:19:57 PM By: Levora Dredge Signed: 05/11/2022 4:21:04 PM By: Massie Kluver Entered By: Massie Kluver on 05/11/2022 09:17:58 Azucena, Sheetal A. ([id number]) -------------------------------------------------------------------------------- [place] Details Patient Name: Heather Dear A. Date of Service: 05/11/2022 9:00 AM Medical Record Number: [id number] Patient Account Number: [id number] Date of Birth/Sex: [birth year] (46 y.o. F) Treating RN: Levora Dredge Primary Care Tahje Borawski: Raelene Bott Other Clinician: Massie Kluver Referring Onetta Spainhower: Raelene Bott Treating Delbert Vu/Extender: Yaakov Guthrie in Treatment: 8 Vital Signs Time Taken: 09:03 Temperature (F): 98.3 Pulse (bpm): 65 Respiratory Rate (breaths/min): 18 Blood Pressure (mmHg): 119/77 Reference Range: 80 - 120 mg / dl Electronic Signature(s) Signed: 05/11/2022 4:21:04 PM By: Massie Kluver Entered By: Massie Kluver on 05/11/2022 09:06:01

## 2022-05-11 NOTE — Progress Notes (Signed)
Heather, [name] ([id number]) Visit Report for 05/11/2022 Chief Complaint Document Details Patient Name: [name], [name] Date of Service: 05/11/2022 9:00 AM Medical Record Number: [id number] Patient Account Number: [id number] Date of Birth/Sex: [birth year] (46 y.o. F) Treating RN: Levora Dredge Primary Care Provider: Raelene Bott Other Clinician: Massie Kluver Referring Provider: Raelene Bott Treating Provider/Extender: Yaakov Guthrie in Treatment: 8 Information Obtained from: Patient Chief Complaint 03/19/2021; patient referred by Dr. Luana Shu who has been looking after her left foot for quite a period of time for review of a nonhealing area in the left midfoot 03/12/2022; bilateral feet wounds and right lower extremity wound. Electronic Signature(s) Signed: 05/11/2022 12:03:59 PM By: Kalman Shan DO Entered By: Kalman Shan on 05/11/2022 11:00:49 [name] ([id number]) -------------------------------------------------------------------------------- Debridement Details Patient Name: Heather Dear A. Date of Service: 05/11/2022 9:00 AM Medical Record Number: [id number] Patient Account Number: [id number] Date of Birth/Sex: [birth year] (46 y.o. F) Treating RN: Levora Dredge Primary Care Provider: Raelene Bott Other Clinician: Massie Kluver Referring Provider: Raelene Bott Treating Provider/Extender: Yaakov Guthrie in Treatment: 8 Debridement Performed for Wound #12 Left,Medial,Plantar Foot Assessment: Performed By: Physician Kalman Shan, MD Debridement Type: Debridement Severity of Tissue Pre Debridement: Fat layer exposed Level of Consciousness (Pre- Awake and Alert procedure): Pre-procedure Verification/Time Out Yes - 09:38 Taken: Start Time: 09:38 Total Area Debrided (L x W): 4.5 (cm) x 2.5 (cm) = 11.25 (cm) Tissue and other material Viable, Non-Viable, Callus, Slough, Subcutaneous, Slough debrided: Level: Skin/Subcutaneous  Tissue Debridement Description: Excisional Instrument: Curette Bleeding: Minimum Hemostasis Achieved: Pressure End Time: 09:46 Response to Treatment: Procedure was tolerated well Level of Consciousness (Post- Awake and Alert procedure): Post Debridement Measurements of Total Wound Length: (cm) 3.8 Width: (cm) 1.6 Depth: (cm) 0.2 Volume: (cm) 0.955 Character of Wound/Ulcer Post Debridement: Stable Severity of Tissue Post Debridement: Fat layer exposed Post Procedure Diagnosis Same as Pre-procedure Electronic Signature(s) Signed: 05/11/2022 12:03:59 PM By: Kalman Shan DO Signed: 05/11/2022 3:19:57 PM By: Levora Dredge Signed: 05/11/2022 4:21:04 PM By: Massie Kluver Entered By: Massie Kluver on 05/11/2022 09:46:38 [name], [name] ([id number]) -------------------------------------------------------------------------------- Debridement Details Patient Name: Heather Dear A. Date of Service: 05/11/2022 9:00 AM Medical Record Number: [id number] Patient Account Number: [id number] Date of Birth/Sex: Jun 17, 1976 (46 y.o. F) Treating RN: Levora Dredge Primary Care Provider: Raelene Bott Other Clinician: Massie Kluver Referring Provider: Raelene Bott Treating Provider/Extender: Yaakov Guthrie in Treatment: 8 Debridement Performed for Wound #11 Right,Plantar Foot Assessment: Performed By: Physician Kalman Shan, MD Debridement Type: Debridement Level of Consciousness (Pre- Awake and Alert procedure): Pre-procedure Verification/Time Out Yes - 09:47 Taken: Start Time: 09:47 Total Area Debrided (L x W): 2.5 (cm) x 1 (cm) = 2.5 (cm) Tissue and other material Viable, Non-Viable, Callus, Slough, Subcutaneous, Slough debrided: Level: Skin/Subcutaneous Tissue Debridement Description: Excisional Instrument: Curette Bleeding: Minimum Hemostasis Achieved: Pressure End Time: 09:50 Response to Treatment: Procedure was tolerated well Level of Consciousness  (Post- Awake and Alert procedure): Post Debridement Measurements of Total Wound Length: (cm) 2.1 Width: (cm) 0.5 Depth: (cm) 0.7 Volume: (cm) 0.577 Character of Wound/Ulcer Post Debridement: Stable Post Procedure Diagnosis Same as Pre-procedure Electronic Signature(s) Signed: 05/11/2022 12:03:59 PM By: Kalman Shan DO Signed: 05/11/2022 3:19:57 PM By: Levora Dredge Signed: 05/11/2022 4:21:04 PM By: Massie Kluver Entered By: Massie Kluver on 05/11/2022 09:50:51 [name], [name] ([id number]) -------------------------------------------------------------------------------- HPI Details Patient Name: Heather Dear A. Date of Service: 05/11/2022 9:00 AM Medical Record Number: [id number] Patient Account Number: [id number] Date of Birth/Sex: [birth year] (46 y.o. F) Treating RN: Levora Dredge  Primary Care Provider: Raelene Bott Other Clinician: Massie Kluver Referring Provider: Raelene Bott Treating Provider/Extender: Yaakov Guthrie in Treatment: 8 History of Present Illness HPI Description: 01/18/18-She is here for initial evaluation of the left great toe ulcer. She is a poor historian in regards to timeframe in detail. She states approximately 4 weeks ago she lacerated her toe on something in the house. She followed up with her primary care who placed her on Bactrim and ultimately a second dose of Bactrim prior to coming to wound clinic. She states she has been treating the toe with peroxide, Betadine and a Band-Aid. She did not check her blood sugar this morning but checked it yesterday morning it was 327; she is unaware of a recent A1c and there are no current records. She saw Dr. she would've orthopedics last week for an old injury to the left ankle, she states he did not see her toe, nor did she bring it to his attention. She smokes approximately 1 pack cigarettes a day. Her social situation is concerning, she arrives this morning with her mother who appears extremely  intoxicated/under the influence; her mother was asked to leave the room and be monitored by the patient's grandmother. The patient's aunt then accompanied the patient and the room throughout the rest of the appointment. We had a lengthy discussion regarding the deleterious effects of uncontrolled hyperglycemia and smoking as it relates to wound healing and overall health. She was strongly encouraged to decrease her smoking and get her diabetes under better control. She states she is currently on a diet and has cut down her [facility] consumption. The left toe is erythematous, macerated and slightly edematous with malodor present. The edema in her left foot is below her baseline, there is no erythema streaking. We will treat her with Santyl, doxycycline; we have ordered and xray, culture and provided a Peg assist surgical shoe and cultured the wound. 01/25/18-She is here in follow-up evaluation for a left great toe ulcer and presents with an abscess to her suprapubic area. She states her blood sugars remain elevated, feeling "sick" and if levels are below 250, but she is trying. She has made no attempt to decrease her smoking stating that we "can't take away her food in her cigarettes". She has been compliant with offloading using the PEG assist you. She is using Santyl daily. the culture obtained last week grew staph aureus and Enterococcus faecalis; continues on the doxycycline and Augmentin was added on Monday. The suprapubic area has erythema, no femoral variation, purple discoloration, minimal induration, was accessed with a cotton tip applicator with sanguinopurulent drainage, this was cultured, I suspect the current antibiotic treatment will cover and we will not add anything to her current treatment plan. She was advised to go to urgent care or ER with any change in redness, induration or fever. 02/01/18-She is here in follow-up evaluation for left great toe ulcers and a new abdominal abscess  from last week. She was able to use packing until earlier this week, where she "forgot it was there". She states she was feeling ill with GI symptoms last week and was not taking her antibiotic. She states her glucose levels have been predominantly less than 200, with occasional levels between 200-250. She thinks this was contributing to her GI symptoms as they have resolved without intervention. There continues to be significant laceration to left toe, otherwise it clinically looks stable/improved. There is now less superficial opening to the lateral aspect of the great toe  that was residual blister. We will transition to [facility] to all wounds, she will continue her Augmentin. If there is no change or deterioration next week for reculture. 02/08/18-She is here in follow-up evaluation for left great toe ulcer and abdominal ulcer. There is an improvement in both wounds. She has been wrapping her left toe with coban, not by our direction, which has created an area of discoloration to the medial aspect; she has been advised to NOT use coban secondary to her neuropathy. She states her glucose levels have been high over this last week ranging from 200-350, she continues to smoke. She admits to being less compliant with her offloading shoe. We will continue with same treatment plan and she will follow-up next week. 02/15/18-She is here in follow-up evaluation for left great toe ulcer and abdominal ulcer. The abdominal ulcer is epithelialized. The left great toe ulcer is improved and all injury from last week using the Coban wrap is resolved, the lateral ulcer is healed. She admits to noncompliance with wearing offloading shoe and admits to glucose levels being greater than 300 most of the week. She continues to smoke and expresses no desire to quit. There is one area medially that probes deeper than it has historically, erythema to the toe and dorsal foot has consistently waxed and waned. There is no  overt signs of cellulitis or infection but we will culture the wound for any occult infection given the new area of depth and erythema. We will hold off on sensitivities for initiation of antibiotic therapy. 02/22/18-She is here in follow up evaluation for left great toe ulcer. There is overall significant improvement in both wound appearance, erythema and edema with changes made last week. She was not initiated on antibiotic therapy. Culture obtained last week showed oxacillin sensitive staph aureus, sensitive to clindamycin. Clindamycin has been called into the pharmacy but she has been instructed to hold off on initiation secondary to overall clinical improvement and her history of antibiotic intolerance. She has been instructed to contact the clinic with any noted changes/deterioration and the wound, erythema, edema and/or pain. She will follow-up next week. She continues to smoke and her glucose levels remain elevated >250; she admits to compliance with offloading shoe 03/01/18 on evaluation today patient appears to be doing fairly well in regard to her left first toe ulcer. She has been tolerating the dressing changes with the [facility] Dressing without complication and overall this has definitely showed signs of improvement according to records as well is what the patient tells me today. I'm very pleased in that regard. She is having no pain today 03/08/18 She is here for follow up evaluation of a left great toe ulcer. She remains non-compliant with glucose control and smoking cessation; glucose levels consistently >200. She states that she got new shoe inserts/peg assist. She admits to compliance with offloading. Since my last evaluation there is significant improvement. We will switch to prisma at this time and she will follow up next week. She is noted to be tachycardic at this appointment, heart rate 120s; she has a history of heart rate 70-130 according to our records. She admits to extreme  agitation r/t personal issues; she was advised to monitor her heartrate and contact her physician if it does not return to a more normal range (<100). She takes cardizem twice daily. 03/15/18-She is here in follow-up evaluation for left great toe ulcer. She remains noncompliant with glucose control and smoking cessation. She admits to compliance with wearing  offloading shoe. The ulcer is improved/stable and we will continue with the same treatment plan and she will follow-up next week 03/22/18-She is here for evaluation for left great toe ulcer. There continues to be significant improvement despite recurrent hyperglycemia (over 500 yesterday) and she continues to smoke. She has been compliant with offloading and we will continue with same treatment plan and she will follow-up next week. 03/29/18-She is here for evaluation for left great toe ulcer. Despite continuing to smoke and uncontrolled diabetes she continues to improve. She is compliant with offloading shoe. We will continue with the same treatment plan and she will follow-up next week 04/05/18- She is here in follow up evaluation for a left great toe ulcer; she presents with small pustule to left fifth toe (resembles ant bite). She admits to compliance with wearing offloading shoe; continues to smoke or have uncontrolled blood glucose control. There is more callus than usual with evidence of bleeding; she denies known trauma. 04/12/18-She is here for evaluation of left great toe ulcer. Despite noncompliance with glycemic control and smoking she continues to make [name], [name] ([id number]) improvement. She continues to wear offloading shoe. The pustule, that was identified last week, to the left fifth toe is resolved. She will follow-up in 2 weeks 05/03/18-she is seen in follow-up evaluation for a left great toe ulcer. She is compliant with offloading, otherwise noncompliant with glycemic control and smoking. She has plateaued and there is  minimal improvement noted. We will transition to [facility], replaced the insert to her surgical shoe and she will follow-up in one week 05/10/18- She is here in follow up evaluation for a left great toe ulcer. It appears stable despite measurement change. We will continue with same treatment plan and follow up next week. 05/24/18-She is seen in follow-up evaluation for a left great toe ulcer. She remains compliant with offloading, has made significant improvement in her diet, decreasing the amount of sugar/soda. She said her recent A1c was 10.9 which is lower than. She did see a diabetic nutritionist/educator yesterday. She continues to smoke. We will continue with the same treatment plan and she'll follow-up next week. 05/31/18- She is seen in follow-up evaluation for left great toe ulcer. She continues to remain compliant with offloading, continues to make improvement in her diet, increasing her water and decreasing the amount of sugar/soda. She does continue to smoke with no desire to quit. We will apply Prisma to the depth and Hydrofera Blue over. We have not received insurance authorization for oasis. She will follow up next week. 06/07/18-She is seen in follow-up evaluation for left great toe ulcer. It has stalled according to today's measurements although base appears stable. She says she saw a diabetic educator yesterday; her average blood sugars are less than 300 which is an improvement for her. She continues to smoke and states "that's my next step" She continues with water over soda. We will order for xray, culture and reinstate ace wrap compression prior to placing apligraf for next week. She is voicing no complaints or concerns. Her dressing will change to iodoflex over the next week in preparation for apligraf. 06/14/18-She is seen in follow-up evaluation for left great toe ulcer. Plain film x-ray performed last week was negative for osteomyelitis. Wound culture obtained last week grew  strep B and OSSA; she is initiated on keflex and cefdinir today; there is erythema to the toe which could be from ace wrap compression, she has a history of wrapping too tight and has  has been encouraged to maintain ace wraps that we place today. We will hold off on application of apligraf today, will apply next week after antibiotic therapy has been initiated. She admits today that she has resumed taking a shower with her foot/toe submerged in water, she has been reminded to keep foot/toe out of the bath water. She will be seen in follow up next week 06/21/18-she is seen in follow-up evaluation for left great toe ulcer. She is tolerating antibiotic therapy with no GI disturbance. The wound is stable. Apligraf was applied today. She has been decreasing her smoking, only had 4 cigarettes yesterday and 1 today. She continues being more compliant in diabetic diet. She will follow-up next week for evaluation of site, if stable will remove at 2 weeks. 06/28/18- She is here in follow up evalution. Apligraf was placed last week, she states the dressing fell off on Tuesday and she was dressing with hydrofera blue. She is healed and will be discharged from the clinic today. She has been instructed to continue with smoking cessation, continue monitoring glucose levels, offloading for an additional 4 weeks and continue with hydrofera blue for additional two weeks for any possible microscopic opening. Readmission: 08/07/18 on evaluation today patient presents for reevaluation concerning the ulcer of her right great toe. She was previously discharged on 06/28/18 healed. Nonetheless she states that this began to show signs of drainage she subsequently went to her primary care provider. Subsequently an x-ray was performed on 08/01/18 which was negative. The patient was also placed on antibiotics at that time. Fortunately they should have been effective for the infection. Nonetheless she's been experiencing some  improvement but still has a lot of drainage coming from the wound itself. 08/14/18 on evaluation today patient's wound actually does show signs of improvement in regard to the erythema at this point. She has completed the antibiotics. With that being said we did discuss the possibility of placing her in a total contact cast as of today although I think that I may want to give this just a little bit more time to ensure nothing recurrence as far as her infection is concerned. I do not want to put in the cast and risk infection at that time if things are not completely resolved. With that being said she is gonna require some debridement today. 08/21/18 on evaluation today patient actually appears to be doing okay in regard to her toe ulcer. She's been tolerating the dressing changes without complication. With that being said it does appear that she is ready and in fact I think it's appropriate for [place] to go ahead and initiate the total contact cast today. Nonetheless she will require some sharp debridement to prepare the wound for application. Overall I feel like things have been progressing well but we do need to do something to get this to close more readily. 08/24/18 patient seen today for reevaluation after having had the total contact cast applied on Tuesday. She seems to have done very well the wound appears to be doing great and overall I'm pleased with the progress that she's made. There were no abnormal areas of rubbing from the cast on her lower extremity. 08/30/18 on evaluation today patient actually appears to be completely healed in regard to her plantar toe ulcer. She tells me at this point she's been having a lot of issues with the cast. She almost fell a couple of times the state shall the step of her dog a couple times as well. This  is been a very frustrating process for her other nonetheless she has completely healed the wound which is excellent news. Overall there does not appear to be  the evidence of infection at this time which is great news. 09/11/18 evaluation today patient presents for follow-up concerning her great toe ulcer on the left which has unfortunately reopened since I last saw her which was only a couple of weeks ago. Unfortunately she was not able to get in to get the shoe and potentially the AFO that's gonna be necessary due to her left foot drop. She continues with offloading shoe but this is not enough to prevent her from reopening it appears. When we last had her in the total contact cast she did well from a healing standpoint but unfortunately the wound reopened as soon as she came out of the cast within just a couple of weeks. 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 This is also part of the reason why she falls on a regular basis. I just do not believe that is gonna be safe for [place] to reinitiate the total contact cast as last time we had this on she fell 3 times one week which is definitely not normal for her. 09/18/18 upon evaluation today the patient actually appears to be doing about the same in regard to her toe ulcer. She did not contact [employer] as I asked her to even though I had given her the prescription. In fact she actually states that she has no idea where the prescription is. She did apparently call [employer] and they told her that all she needed to do was bring the prescription in order to be able to be seen and work on getting the AFO for her left foot. With all that being said she still does not have an appointment and I'm not sure were things stand that regard. I will give her a new prescription today in order to contact them to get this set up. 09/25/18 on evaluation today patient actually appears to be doing about the same in regard to her toes  ulcer. She does have a small areas which seems to have a lot of callous buildup around the edge of the wound which is going to need sharp debridement today. She still is waiting to be scheduled for evaluation with [employer] for possibility of an AFO. She states there supposed to call her tomorrow to get this set up. Unfortunately it does appear that her foot specifically the toe area is showing signs of erythema. There does not appear to be any systemic infection which is in these good news. [name], [name] ([id number]) 10/02/18 on evaluation today patient actually appears to be doing about the same in regard to her toe ulcer. This really has not done too well although it's not significantly larger it's also not significantly smaller. She has been tolerating the dressing changes without complication. She actually has her appointment with [facility] and [place] tomorrow to hopefully be measured for obtaining and AFO splint. I think this would be helpful preventing this from reoccurring. We had contemplated starting the cast this week although to be honest I am reluctant to do that as she's been having nausea, vomiting, and seizure activity over the past three days. She has a history  of seizures and have been told is nothing that can be done for these. With that being said I do believe that along with the seizures have the nausea vomiting which upon further questioning doesn't seem to be the normal for her and makes me concerned for the possibility of infection or something else going on. I discussed this with the patient and her mother during the office visit today. I do not feel the wound is effective but maybe something else. The responses this was "this just happens to her at times and we don't know why". They did not seem to be interested in going to the hospital to have this checked out further. 10/09/18 on evaluation today patient presents for follow-up concerning her ongoing toe ulcer. She has  been tolerating the dressing changes without complication. Fortunately there does not appear to be any evidence of infection which is great news however I do think that the patient would benefit from going ahead for with the total contact cast. She's actually in a wheelchair today she tells me that she will use her walker if we initiate the cast. I was very specific about the fact that if we were gonna do the cast I wanted to make sure that she was using the walker in order to prevent any falls. She tells me she does not have stairs that she has to traverse on a regular basis at her home. She has not had any seizures since last week again that something that happens to her often she tells me she did talk to [employer] and they said that it may take up to three weeks to get the brace approved for her. Hopefully that will not take that long but nonetheless in the meantime I do think the cast could be of benefit. 10/12/18 on evaluation today patient appears to be doing rather well in regard to her toe ulcer. It's just been a few days and already this is significantly improved both as far as overall appearance and size. Fortunately there's no sign of infection. She is here for her first obligatory cast change. 10/19/18 Seen today for follow up and management of left great toe ulcer. Wound continues to show improvement. Noted small open area with seroussang drainage with palpation. Denies any increased pain or recent fevers during visit. She will continue calcium alginate with offloading shoe. Denies any questions or concerns during visit. 10/26/18 on evaluation today patient appears to be doing about the same as when I last saw her in regard to her wound bed. Fortunately there does not appear to be any signs of infection. Unfortunately she continues to have a breakdown in regard to the toe region any time that she is not in the cast. It takes almost no time at all for this to happen. Nonetheless she still has  not heard anything from the brace being made by [employer] as to when exactly this will be available to her. Fortunately there is no signs of infection at this time. 10/30/18 on evaluation today patient presents for application of the total contact cast as we just received him this morning. Fortunately we are gonna be able to apply this to her today which is great news. She continues to have no significant pain which is good news. Overall I do feel like things have been improving while she was the cast is when she doesn't have a cast that things get worse. She still has not really heard anything from [facility] regarding her brace. 11/02/18 upon  evaluation today patient's wound already appears to be doing significantly better which is good news. Fortunately there does not appear to be any signs of infection also good news. Overall I do think the total contact cast as before is helping to heal this area unfortunately it's just not gonna likely keep the area closed and healed without her getting her brace at least. Again the foot drop is a significant issue for her. 11/09/18 on evaluation today patient appears to be doing excellent in regard to her toe ulcer which in fact is completely healed. Fortunately we finally got the situation squared away with the paperwork which was needed to proceed with getting her brace approved by Medicaid. I have filled that out unfortunately that information has been sent to the orthopedic office that I worked at 2 1/2 years ago and not tired Current wound care measures. Fortunately she seems to be doing very well at this time. 11/23/18 on evaluation today patient appears to be doing More Poorly Compared to Last Time I Saw Her. At [facility] She Had Completely Healed. Currently she is continuing to have issues with reopening. She states that she just found out that the brace was approved through Medicaid now she just has to go get measured in order to have this fitted for her and  then made. Subsequently she does not have an appointment for this yet that is going to complicate things we obviously cannot put her back in the cast if we do not have everything measured because they're not gonna be able to measure her foot while she is in the cast. 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 Obviously this is unfortunate and does have me somewhat worried as well. 11/30/18 on evaluation today patient's toe ulcer actually appears to be doing fairly well. The good news is she will be getting her brace in the shoes next week on Wednesday. Hopefully we will be able to get this to heal without having to go back in the cast however she may need the cast in order to get the wound completely heal and then go from there. Fortunately there's no signs of infection at this time. 12/07/18 on evaluation today patient fortunately did receive her brace and she states she could tell this definitely makes her walk better. With that being said she's been having issues with her toe where she noticed yesterday there was a lot of tissue that was loosing off this appears to be much larger than what it was previous. She also states that her leg has been read putting much across the top of her foot just about the ankle although this seems to be receiving somewhat. The total area is still red and appears to be someone infected as best I can tell. She is previously taken Bactrim and that may be a good option for her today as well. We are gonna see what I wound culture shows as well and I think that this is definitely appropriate. With that being said outside of the culture I still need to initiate something in the interim and that's what I'm gonna go ahead and select Bactrim is a good option for her. 12/14/18 on evaluation today patient appears to be doing better in regard to her left great toe ulcer as compared to  last week's evaluation. There's still some erythema although this is significantly improved which is  excellent news. Overall I do believe that she is making good progress is still gonna take some time before she is where I would like her to be from the standpoint of being able to place her back into the total contact cast. Hopefully we will be where we need to be by next week. 12/21/18 on evaluation today patient actually appears to be doing poorly in regard to her toe ulcer. She's been tolerating the dressing changes without complication. Fortunately there's no signs of systemic infection although she does have a lot of drainage from the toe ulcer and this does seem to be causing some issues at this point. She does have erythema on the distal portion of her toe that appears to be likely cellulitis. 12/28/18 on evaluation today patient actually appears to be doing a little better in my pinion in regard to her toe ulcer. With that being said she still does have some evidence of infection at this time and for her culture she had both E. coli as well as enterococcus as organisms noted on evaluation. For that reason I think that though the Keflex likely has treated the E. coli rather well this has really done nothing for the enterococcus. We are going to have to initiate treatment for this specifically. [name], [name] ([id number]) 01/04/19 on evaluation today patient's toe actually appears to be doing better from the standpoint of infection. She currently would like to see about putting the cash back on I think that this is appropriate as long as she takes care of it and keeps it from getting wet. She is gonna have some drainage we can definitely pass this up with Drawtex and alginate to try to prevent as much drainage as possible from causing the problems. With that being said I do want to at least try her with the cast between now and Tuesday. If there any issues we can't continue to use it then I will  discontinue the use of the cast at that point. 01/08/19 on evaluation today patient actually appears to be doing very well as far as her foot ulcer specifically the great toe on the left is concerned. She did have an area of rubbing on the medial aspect of her left ankle which again is from the cast. Fortunately there's no signs of infection at this point in this appears to be a very slight skin breakdown. The patient tells me she felt it rubbing but didn't think it was that bad. Fortunately there is no signs of active infection at this time which is good news. No fevers, chills, nausea, or vomiting noted at this time. 01/15/19 on evaluation today patient actually appears to be doing well in regard to her toe ulcer. Again as previous she seems to do well and she has the cast on which indicates to me that during the time she doesn't have a cast on she's putting way too much pressure on this region. Obviously I think that's gonna be an issue as with the current national emergency concerning the Covid-19 Virus it has been recommended that we discontinue the use of total contact casting by the chief medical officer of our company, Dr. Simona Huh. The reasoning is that if a patient becomes sick and cannot come into have the cast removed they could not just leave this on for an additional two weeks. Obviously the hospitals also do not want to receive patient's who are sick into the emergency department to potentially contaminate the region and spread the Covid-19  Virus among other sick individuals within the hospital system. Therefore at this point we are suspending the use of total contact cast until the current emergency subsides. This was all discussed with the patient today as well. 01/22/19 on evaluation today patient's wound on her left great toe appears to be doing slightly worse than previously noted last week. She tells me that she has been on this quite a bit in fact she tells me she's been awake for 38  straight hours. This is due to the fact that she's having to care for grandparents because nobody else will. She has been taking care of them for five the last seven days since I've seen her they both have dementia his is from a stroke and her grandmother's was progressive. Nonetheless she states even her mom who knows her condition and situation has only help two of those days to take care of them she's been taking care of the rest. Fortunately there does not appear to be any signs of active infection in regard to her toe at this point although obviously it doesn't look as good as it did previous. I think this is directly related to her not taking off the pressure and friction by way of taking things easy. Though I completely understand what's going on. 01/29/19 on evaluation today patient's tools are actually appears to be showing some signs of improvement today compared to last week's evaluation as far as not necessarily the overall size of the wound but the fact that she has some new skin growth in between the two ends of the wound opening. Overall I feel like she has done well she states that she had a family member give her what sounds to be a CAM walker boot which has been helpful as well. 02/05/19 on evaluation today patient's wound bed actually appears to be doing significantly better in regard to her overall appearance of the size of the wound. With that being said she is still having an issue with offloading efficiently enough to get this to close. Apparently there is some signs of infection at this point as well unfortunately. Previously she's done well of Augmentin I really do not see anything that needs to be culture currently but there theme and cellulitis of the foot that I'm seeing I'm gonna go ahead and place her on an antibiotic today to try to help clear this up. 02/12/2019 on evaluation today patient actually appears to be doing poorly in regard to her overall wound status. She tells  me she has been using her offloading shoe but actually comes in today wearing her tennis shoe with the AFO brace. Again as I previously discussed with her this is really not sufficient to allow the area to heal appropriately. Nonetheless she continues to be somewhat noncompliant and I do wonder based on what she has told my nurse in the past as to whether or not a good portion of this noncompliance may be recreational drug and alcohol related. She has had a history of heroin overdose and this was fairly recently in the past couple of months that have been seeing her. Nonetheless overall I feel like her wound looks significantly worse today compared to what it was previous. She still has significant erythema despite the Augmentin I am not sure that this is an appropriate medication for her infection I am also concerned that the infection may have gone down into her bone. 02/19/19 on evaluation today patient actually appears to be doing about the  same in regard to her toe ulcer. Unfortunately she continues to show signs of bone exposure and infection at this point. There does not appear to be any evidence of worsening of the infection but I'm also not really sure that it's getting significantly better. She is on the Augmentin which should be sufficient for the Staphylococcus aureus infection that she has at this point. With that being said she may need IV antibiotics to more appropriately treat this. We did have a discussion today about hyperbaric option therapy. 02/28/19 on evaluation today patient actually appears to be doing much worse in regard to the wound on her left great toe as compared to even my previous evaluation last week. Unfortunately this seems to be training in a pretty poor direction. Her toe was actually now starting to angle laterally and I can actually see the entire joint area of the proximal portion of the digit where is the distal portion of the digit again is no longer even in  contact with the joint line. Unfortunately there's a lot more necrotic tissue around the edge and the toe appears to be showing signs of becoming gangrenous in my pinion. I'm very concerned about were things stand at this point. She did see infectious disease and they are planning to send in a prescription for Sivextro for her and apparently this has been approved. With that being said I don't think she should avoid taking this but at the same time I'm not sure that it's gonna be sufficient to save her toe at this point. 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 03/05/19 on evaluation today patient actually appears to be doing quite well in regard to her toe all things considering. She still has bone exposed but there appears to be much less your thing on overall the appearance of the wound and the toe itself is dramatically improved. She still does have some issues currently obviously with infection she did see vascular as well and there concerned that her blood flow to the toad. For that reason they are setting up for an angiogram next week. 03/14/19 on evaluation today patient appears to be doing very poor in regard to her toe and specifically in regard to the ulceration and the fact that she's starting to notice the toe was leaning even more towards the lateral aspect and the complete joint is visible on the proximal aspect of the joint. Nonetheless she's also noted a significant odor and the tip of the toe is turning more dark and necrotic appearing. Overall I think she is getting worse not better as far as this is concerned. For that reason I am recommending at this point that she likely needs to be seen for likely amputation. READMISSION 03/19/2021 This is a patient that we cared for in this  clinic for a prolonged period of time in 2019 and 2020 with a left foot and left first toe wound. 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 ([id number]) 04/09/20 by Dr. Luana Shu. In December 2021 she had an ulcer on her right great toe as well as the fourth and fifth toes. 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 She also had an angiogram at that time and underwent angioplasty of the right anterior tibial artery. In any case she claims that the wound on the right foot is closed I did not look at this today which was probably an oversight although I think that should be done next week. After her surgery she developed a dehiscence but I do not see any follow-up of this. According to Dr. Deborra Medina last review that she was out of the area being cared for by another physician but recently came back to his attention. 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 A culture of this area showed E. coli apparently before she came back to see Dr. Luana Shu she was supposed to be receiving antibiotics but she did not really take them. Nor is she offloading this area at all. Finally her last hemoglobin A1c listed in epic was in March 2022 at 14.1 she says things are a lot better since then although I am not sure. 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 She had this wound at the time. During this admission she had E. coli sepsis a TEE was negative. 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 She is still a heavy  cigarette smoker. She has a history of bipolar disorder seizure disorder ABI in our clinic was 1.05 6/1; left midfoot in the setting of a TMA done previously. Round circular wound with a "knuckle" of protruding tissue. The problem is that the knuckle was not attached to any of the surrounding granulation and this probed proximally widely I removed a large portion of this tissue. This wound goes with considerable undermining laterally. I do not feel any bone there was no purulence but this is a deep wound. 6/8; in spite of the debridement I did last week. She arrives with a wound looking exactly the same. A protruding "knuckle" of tissue nonadherent to most of the surrounding tissue. There is considerable depth around this from 6-12 o'clock at 2.7 cm and undermining of 1 cm. This does not look overtly infected and the x-ray I did last week was negative for any osseous abnormalities. We have been using silver collagen 6/15; deep tissue culture I did last week showed moderate staph aureus and moderate Pseudomonas. This will definitely require prolonged antibiotic therapy. The pathology on the protuberant area was negative for malignancy fungus etc. 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 We have been using silver collagen. I am going to be prescribing Levaquin for 2 weeks[address] Her [address] scan of the foot is down for 7/5 6/22; CT scan of the foot on 7 5. She says she has hardware in the left leg from her previous fracture. She is on the Levaquin for the deep tissue culture I did that showed methicillin sensitive staph aureus and Pseudomonas. I gave her a 2-week supply and she will have another week. She arrives in clinic today with the same protuberant tissue however this is nonadherent to the tissue surrounding it. I am really at a loss to explain this unless there is underlying deep tissue infection 6/29; patient presents for 1 week follow-up. She  has been using collagen to  the wound bed. She reports taking her antibiotics as prescribed.She has no complaints or issues today. She denies signs of infection. 7/6; patient presents for one week followup. She has been using collagen to the wound bed. She states she is taking Levaquin however at times she is not able to keep it down. She denies signs of infection. 7/13; patient presents for 1 week follow-up. She has been using silver alginate to the wound bed. She still has nausea when taking her antibiotics. She denies signs of infection. 7/20; patient presents for 1 week follow-up. She has been using silver alginate with gentamicin cream to the wound bed. She denies any issues and has no complaints today. She denies signs of infection. 7/27; patient presents for 1 week follow-up. She continues to use silver alginate with gentamicin cream to the wound bed. She reports starting her antibiotics. She has no issues or complaints. Overall she reports stability to the wound. 8/3; patient presents for 1 week follow-up. She has been using silver alginate with gentamicin cream to the wound bed. She reports completing all antibiotics. She has no issues or complaints today. She denies signs of infection. 8/17; patient presents for 2-week follow-up. He is to use silver alginate to the wound bed. She has no issues or complaints today. She denies signs of infection. She reports her pain has improved in her foot since last clinic visit 8/24; patient presents for 1 week follow-up. She continues to use silver alginate to the wound bed. She has no issues or complaints. She denies signs of infection. Pain is stable. 9/7; patient presents for follow-up. She missed her last week appointment due to feeling ill. She continues to use silver alginate. She has a new wound to the right lower extremity that is covered in eschar. She states It occurred over the past week and has no idea how it started. She currently denies  signs of infection. 9/14; patient presents for follow-up. To the left foot wound she has been using gentamicin cream and silver alginate. To the right lower extremity wound she has been keeping this covered and has not obtain Santyl. 9/21; patient presents for follow-up. She reports using gentamicin cream and silver alginate to the left foot and Santyl to the right lower extremity wound. She has no issues or complaints today. She denies signs of infection. 9/28; patient presents for follow-up. She reports a new wound to her right heel. She states this occurred a few days ago and is progressively gotten worse. She has been trying to clean the area with a Q-tip and Santyl. She reports stability in the other 2 wounds. She has been using gentamicin cream and silver alginate to the left foot and Santyl to the right lower extremity wound. 10/12; patient presents for follow-up. She reports improvement to the wound beds. She is seeing vein and vascular to discuss the potential of a left BKA. She states they are going to do an arteriogram. She continues to use silver alginate with dressing changes to her wounds. 11/2; patient presents for follow-up. She states she has not been doing dressing changes to the wound beds. She states she is not able to offload the areas. She reports chronic pain to her left foot wound. 11/9; patient presents for follow-up. She came in with only socks on. She states she forgot to put on shoes. It is unclear if she is doing any dressing changes. She currently denies systemic signs of infection. 11/16; patient presents for follow-up. She came again only  with socks on. She states she does not wear shoes ever. It is unclear if she does dressing changes. She currently denies systemic signs of infection. 11/23; patient presents for follow-up. She wore her shoes today. It still unclear exactly what dressing she is using for each wound but she did states she obtained Dakin's solution and  has been using this to the left foot wound. She currently denies signs of infection. 11/30; patient presents for follow-up. She has no issues or complaints today. She currently denies signs of infection. 12/7; patient presents for follow-up. She has no issues or complaints today. She has been using Hydrofera Blue to the right heel wound and Dakin solution to the left foot wound. Her right anterior leg wound is healed. She currently denies signs of infection. [name], [name] ([id number]) 12/14; patient presents for follow-up. She has been using Hydrofera Blue to the right heel and Dakin's to the left foot wounds. She has no issues or complaints today. She denies signs of infection. 12/21; patient presents for follow-up. She reports using Hydrofera Blue to the right heel and Dakin's to the left foot wound. She denies signs of infection. 12/28; patient presents for follow-up. She continues to use Dakin's to the left foot wound and Hydrofera Blue to the right heel wound. She denies signs of infection. 1/4; patient presents for follow-up. She has no issues or complaints today. She denies signs of infection. 1/11; patient presents for follow-up. It is unclear if she has been dressing these wounds over the past week. She currently denies signs of infection. 1/18; patient presents for follow-up. She states she has been using Dakin's wet-to-dry dressings to the left foot. She has been using Hydrofera Blue to the right foot foot wound. She states that the anterior right leg wound has reopened and draining serous fluid. She denies signs of infection. 1/25; patient presents for follow-up. She has no issues or complaints today. 2/1; patient presents for follow-up. She has no issues or complaints today. She denies signs of infection. 2/8; patient presents for follow-up. She has lost her surgical shoes. She did not have a dressing to the right heel wound. She currently denies signs of infection. 2/15; patient  presents for follow-up. She reports more pain to the right heel today. She denies purulent drainage Or fever/chills 2/22; patient presents for follow-up. She reports taking clindamycin over the past week. She states that she continues to have pain to her right heel. She reports purulent drainage. Readmission 03/16/2022 Ms. Areona Homer is a 46 year old female with a past medical history of type 2 diabetes, osteomyelitis to her feet, chronic systolic heart failure and bipolar disorder that presents to the clinic for bilateral feet wounds and right lower extremity wound. She was last seen in our clinic on 12/15/2021. At that time she had purulent drainage coming out of her right plantar foot and I recommended she go to the ED. She states she went to [facility] and has been there for the past 3 months. I cannot see the records. She states she had OR debridement and was on several weeks of IV antibiotics while inpatient. Since discharge she has not been taking care of the wound beds. She had nothing on her feet other than socks today. She currently denies signs of infection. 5/31; patient presents for follow-up. She has been using Dakin's wet-to-dry dressings to the wound beds on her feet bilaterally and antibiotic ointment to the right anterior leg wound. She had a wound culture done  at last clinic visit that showed moderate Pseudomonas aeruginosa sensitive to ciprofloxacin. She currently denies systemic signs of infection. 6/14; patient presents for follow-up. She received Keystone 5 days ago and has been using this on the wound beds. She states that last week she had to go to the hospital because she had increased warmth and erythema to the right foot. She was started on 2 oral antibiotics. She states she has been taking these. She currently denies systemic signs of infection. She has no issues or complaints today. 6/21; patient presents for follow-up. She states she has been using  Keystone antibiotics to the wound beds. She has no issues or complaints today. She denies signs of infection. 6/28; patient presents for follow-up. She has been using Keystone antibiotics to the wound beds. She has no issues or complaints today. 7/12; patient presents for follow-up. Has been using Keystone antibiotics to the wound beds with calcium alginate. She has no issues or complaints today. She never followed up with her orthopedic surgeon who did the OR debridement to the right foot. We discussed the total contact cast for the left foot and patient would like to do this next week. 7/19; patient presents for follow-up. She has been using Keystone antibiotics with calcium alginate to the wound beds. She has no issues or complaints today. Patient is in agreement to do the total contact cast of the left foot today. She knows to return later this week for the obligatory cast change. Electronic Signature(s) Signed: 05/11/2022 12:03:59 PM By: Kalman Shan DO Entered By: Kalman Shan on 05/11/2022 11:01:28 [name] ([id number]) -------------------------------------------------------------------------------- Physical Exam Details Patient Name: Heather Dear A. Date of Service: 05/11/2022 9:00 AM Medical Record Number: [id number] Patient Account Number: [id number] Date of Birth/Sex: 01-13-1976 (46 y.o. F) Treating RN: Levora Dredge Primary Care Provider: Raelene Bott Other Clinician: Massie Kluver Referring Provider: Raelene Bott Treating Provider/Extender: Yaakov Guthrie in Treatment: 8 Constitutional . Cardiovascular . Psychiatric . Notes Right foot: To the plantar heel there is an incision site with increased depth and nonviable tissue at the surface. No probing to bone. Left foot: To the medial aspect there is an open wound with granulation tissue, nonviable tissue and circumferential callus. No surrounding signs of infection to any of the wound  beds. Electronic Signature(s) Signed: 05/11/2022 12:03:59 PM By: Kalman Shan DO Entered By: Kalman Shan on 05/11/2022 11:02:47 [name] ([id number]) -------------------------------------------------------------------------------- Physician Orders Details Patient Name: Heather Dear A. Date of Service: 05/11/2022 9:00 AM Medical Record Number: [id number] Patient Account Number: [id number] Date of Birth/Sex: Oct 31, 1975 (46 y.o. F) Treating RN: Levora Dredge Primary Care Provider: Raelene Bott Other Clinician: Massie Kluver Referring Provider: Raelene Bott Treating Provider/Extender: Yaakov Guthrie in Treatment: 8 Verbal / Phone Orders: No Diagnosis Coding Follow-up Appointments o Return Appointment in 1 week. o Nurse Visit as needed [employer] o Wash wounds with antibacterial soap and water. o No tub bath. Anesthetic (Use 'Patient Medications' Section for Anesthetic Order Entry) o Lidocaine applied to wound bed Edema Control - Lymphedema / Segmental Compressive Device / Other o Elevate, Exercise Daily and Avoid Standing for Long Periods of Time. o Elevate legs to the level of the heart and pump ankles as often as possible o Elevate leg(s) parallel to the floor when sitting. Off-Loading o Total Contact Cast to Left Lower Extremity - contact cast #1 applied Don't get cast wet o Other: - keep pressure off of feet. Medications-Please add to medication list. o Keystone Compound -  Keystone gel Wound Treatment Wound #11 - Foot Wound Laterality: Plantar, Right Cleanser: Soap and Water 1 x Per Day/30 Days Discharge Instructions: Gently cleanse wound with antibacterial soap, rinse and pat dry prior to dressing wounds Topical: keystone gel 1 x Per Day/30 Days Primary Dressing: Aquacel Extra Hydrofiber Dressing, 4x5 (in/in) (Generic) 1 x Per Day/30 Days Discharge Instructions: packed into wound Secondary Dressing: ABD Pad  5x9 (in/in) (Generic) 1 x Per Day/30 Days Discharge Instructions: Cover with ABD pad Secured With: Medipore Tape - 36M Medipore H Soft Cloth Surgical Tape, 2x2 (in/yd) (Generic) 1 x Per Day/30 Days Secured With: Kerlix Roll Sterile or Non-Sterile 6-ply 4.5x4 (yd/yd) (Generic) 1 x Per Day/30 Days Discharge Instructions: Apply Kerlix as directed Wound #12 - Foot Wound Laterality: Plantar, Left, Medial Cleanser: Soap and Water 1 x Per Day/30 Days Discharge Instructions: Gently cleanse wound with antibacterial soap, rinse and pat dry prior to dressing wounds Topical: keystone gel 1 x Per Day/30 Days Discharge Instructions: apply very thin layer to wound Primary Dressing: Aquacel Extra Hydrofiber Dressing, 4x5 (in/in) (Generic) 1 x Per Day/30 Days Discharge Instructions: placed over keystone gel Secondary Dressing: ABD Pad 5x9 (in/in) (Generic) 1 x Per Day/30 Days Discharge Instructions: Cover with ABD pad Menden, Niaja A. ([id number]) Secured With: Medipore Tape - 36M Medipore H Soft Cloth Surgical Tape, 2x2 (in/yd) (Generic) 1 x Per Day/30 Days Secured With: Kerlix Roll Sterile or Non-Sterile 6-ply 4.5x4 (yd/yd) (Generic) 1 x Per Day/30 Days Discharge Instructions: Apply Kerlix as directed Electronic Signature(s) Signed: 05/11/2022 12:03:59 PM By: Kalman Shan DO Entered By: Kalman Shan on 05/11/2022 11:05:11 Dezeeuw, Yocelyn A. ([id number]) -------------------------------------------------------------------------------- Problem List Details Patient Name: Heather Dear A. Date of Service: 05/11/2022 9:00 AM Medical Record Number: [id number] Patient Account Number: [id number] Date of Birth/Sex: 1975/12/24 (46 y.o. F) Treating RN: Levora Dredge Primary Care Provider: Raelene Bott Other Clinician: Massie Kluver Referring Provider: Raelene Bott Treating Provider/Extender: Yaakov Guthrie in Treatment: 8 Active Problems ICD-10 Encounter Code Description Active Date  MDM Diagnosis 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 03/16/2022 No Yes severity L97.512 Non-pressure chronic ulcer of other part of right foot with fat layer 03/16/2022 No Yes exposed E11.621 Type 2 diabetes mellitus with foot ulcer 03/16/2022 No Yes E11.42 Type 2 diabetes mellitus with diabetic polyneuropathy 03/16/2022 No Yes L97.811 Non-pressure chronic ulcer of other part of right lower leg limited to 03/16/2022 No Yes breakdown of skin Inactive Problems Resolved Problems Electronic Signature(s) Signed: 05/11/2022 12:03:59 PM By: Kalman Shan DO Entered By: Kalman Shan on 05/11/2022 11:00:45 Shults, Zelphia A. ([id number]) -------------------------------------------------------------------------------- Progress Note Details Patient Name: Heather Dear A. Date of Service: 05/11/2022 9:00 AM Medical Record Number: [id number] Patient Account Number: [id number] Date of Birth/Sex: 08-Apr-1976 (46 y.o. F) Treating RN: Levora Dredge Primary Care Provider: Raelene Bott Other Clinician: Massie Kluver Referring Provider: Raelene Bott Treating Provider/Extender: Yaakov Guthrie in Treatment: 8 Subjective Chief Complaint Information obtained from Patient 03/19/2021; patient referred by Dr. Luana Shu who has been looking after her left foot for quite a period of time for review of a nonhealing area in the left midfoot 03/12/2022; bilateral feet wounds and right lower extremity wound. History of Present Illness (HPI) 01/18/18-She is here for initial evaluation of the left great toe ulcer. She is a poor historian in regards to timeframe in detail. She states approximately 4 weeks ago she lacerated her toe on something in the house. She followed up with her primary care who placed her  on Bactrim and ultimately a second dose of Bactrim prior to coming to wound clinic. She states she has been treating the toe with peroxide, Betadine and  a Band-Aid. She did not check her blood sugar this morning but checked it yesterday morning it was 327; she is unaware of a recent A1c and there are no current records. She saw Dr. she would've orthopedics last week for an old injury to the left ankle, she states he did not see her toe, nor did she bring it to his attention. She smokes approximately 1 pack cigarettes a day. Her social situation is concerning, she arrives this morning with her mother who appears extremely intoxicated/under the influence; her mother was asked to leave the room and be monitored by the patient's grandmother. The patient's aunt then accompanied the patient and the room throughout the rest of the appointment. We had a lengthy discussion regarding the deleterious effects of uncontrolled hyperglycemia and smoking as it relates to wound healing and overall health. She was strongly encouraged to decrease her smoking and get her diabetes under better control. She states she is currently on a diet and has cut down her [facility] consumption. The left toe is erythematous, macerated and slightly edematous with malodor present. The edema in her left foot is below her baseline, there is no erythema streaking. We will treat her with Santyl, doxycycline; we have ordered and xray, culture and provided a Peg assist surgical shoe and cultured the wound. 01/25/18-She is here in follow-up evaluation for a left great toe ulcer and presents with an abscess to her suprapubic area. She states her blood sugars remain elevated, feeling "sick" and if levels are below 250, but she is trying. She has made no attempt to decrease her smoking stating that we "can't take away her food in her cigarettes". She has been compliant with offloading using the PEG assist you. She is using Santyl daily. the culture obtained last week grew staph aureus and Enterococcus faecalis; continues on the doxycycline and Augmentin was added on Monday. The suprapubic area  has erythema, no femoral variation, purple discoloration, minimal induration, was accessed with a cotton tip applicator with sanguinopurulent drainage, this was cultured, I suspect the current antibiotic treatment will cover and we will not add anything to her current treatment plan. She was advised to go to urgent care or ER with any change in redness, induration or fever. 02/01/18-She is here in follow-up evaluation for left great toe ulcers and a new abdominal abscess from last week. She was able to use packing until earlier this week, where she "forgot it was there". She states she was feeling ill with GI symptoms last week and was not taking her antibiotic. She states her glucose levels have been predominantly less than 200, with occasional levels between 200-250. She thinks this was contributing to her GI symptoms as they have resolved without intervention. There continues to be significant laceration to left toe, otherwise it clinically looks stable/improved. There is now less superficial opening to the lateral aspect of the great toe that was residual blister. We will transition to [facility] to all wounds, she will continue her Augmentin. If there is no change or deterioration next week for reculture. 02/08/18-She is here in follow-up evaluation for left great toe ulcer and abdominal ulcer. There is an improvement in both wounds. She has been wrapping her left toe with coban, not by our direction, which has created an area of discoloration to the medial aspect; she  has been advised to NOT use coban secondary to her neuropathy. She states her glucose levels have been high over this last week ranging from 200-350, she continues to smoke. She admits to being less compliant with her offloading shoe. We will continue with same treatment plan and she will follow-up next week. 02/15/18-She is here in follow-up evaluation for left great toe ulcer and abdominal ulcer. The abdominal ulcer is  epithelialized. The left great toe ulcer is improved and all injury from last week using the Coban wrap is resolved, the lateral ulcer is healed. She admits to noncompliance with wearing offloading shoe and admits to glucose levels being greater than 300 most of the week. She continues to smoke and expresses no desire to quit. There is one area medially that probes deeper than it has historically, erythema to the toe and dorsal foot has consistently waxed and waned. There is no overt signs of cellulitis or infection but we will culture the wound for any occult infection given the new area of depth and erythema. We will hold off on sensitivities for initiation of antibiotic therapy. 02/22/18-She is here in follow up evaluation for left great toe ulcer. There is overall significant improvement in both wound appearance, erythema and edema with changes made last week. She was not initiated on antibiotic therapy. Culture obtained last week showed oxacillin sensitive staph aureus, sensitive to clindamycin. Clindamycin has been called into the pharmacy but she has been instructed to hold off on initiation secondary to overall clinical improvement and her history of antibiotic intolerance. She has been instructed to contact the clinic with any noted changes/deterioration and the wound, erythema, edema and/or pain. She will follow-up next week. She continues to smoke and her glucose levels remain elevated >250; she admits to compliance with offloading shoe 03/01/18 on evaluation today patient appears to be doing fairly well in regard to her left first toe ulcer. She has been tolerating the dressing changes with the [facility] Dressing without complication and overall this has definitely showed signs of improvement according to records as well is what the patient tells me today. I'm very pleased in that regard. She is having no pain today 03/08/18 She is here for follow up evaluation of a left great toe ulcer.  She remains non-compliant with glucose control and smoking cessation; glucose levels consistently >200. She states that she got new shoe inserts/peg assist. She admits to compliance with offloading. Since my last evaluation there is significant improvement. We will switch to prisma at this time and she will follow up next week. She is noted to be tachycardic at this appointment, heart rate 120s; she has a history of heart rate 70-130 according to our records. She admits to extreme agitation r/t personal issues; she was advised to monitor her heartrate and contact her physician if it does not return to a more normal range (<100). She takes cardizem twice daily. 03/15/18-She is here in follow-up evaluation for left great toe ulcer. She remains noncompliant with glucose control and smoking cessation. She admits to compliance with wearing offloading shoe. The ulcer is improved/stable and we will continue with the same treatment plan and she will follow-up next week 03/22/18-She is here for evaluation for left great toe ulcer. There continues to be significant improvement despite recurrent hyperglycemia (over 500 yesterday) and she continues to smoke. She has been compliant with offloading and we will continue with same treatment plan and she will follow-up next week. GERALYNN, CAPRI A. ([id number]) 03/29/18-She is here  for evaluation for left great toe ulcer. Despite continuing to smoke and uncontrolled diabetes she continues to improve. She is compliant with offloading shoe. We will continue with the same treatment plan and she will follow-up next week 04/05/18- She is here in follow up evaluation for a left great toe ulcer; she presents with small pustule to left fifth toe (resembles ant bite). She admits to compliance with wearing offloading shoe; continues to smoke or have uncontrolled blood glucose control. There is more callus than usual with evidence of bleeding; she denies known trauma. 04/12/18-She is  here for evaluation of left great toe ulcer. Despite noncompliance with glycemic control and smoking she continues to make improvement. She continues to wear offloading shoe. The pustule, that was identified last week, to the left fifth toe is resolved. She will follow-up in 2 weeks 05/03/18-she is seen in follow-up evaluation for a left great toe ulcer. She is compliant with offloading, otherwise noncompliant with glycemic control and smoking. She has plateaued and there is minimal improvement noted. We will transition to [facility], replaced the insert to her surgical shoe and she will follow-up in one week 05/10/18- She is here in follow up evaluation for a left great toe ulcer. It appears stable despite measurement change. We will continue with same treatment plan and follow up next week. 05/24/18-She is seen in follow-up evaluation for a left great toe ulcer. She remains compliant with offloading, has made significant improvement in her diet, decreasing the amount of sugar/soda. She said her recent A1c was 10.9 which is lower than. She did see a diabetic nutritionist/educator yesterday. She continues to smoke. We will continue with the same treatment plan and she'll follow-up next week. 05/31/18- She is seen in follow-up evaluation for left great toe ulcer. She continues to remain compliant with offloading, continues to make improvement in her diet, increasing her water and decreasing the amount of sugar/soda. She does continue to smoke with no desire to quit. We will apply Prisma to the depth and Hydrofera Blue over. We have not received insurance authorization for oasis. She will follow up next week. 06/07/18-She is seen in follow-up evaluation for left great toe ulcer. It has stalled according to today's measurements although base appears stable. She says she saw a diabetic educator yesterday; her average blood sugars are less than 300 which is an improvement for her. She continues to smoke and  states "that's my next step" She continues with water over soda. We will order for xray, culture and reinstate ace wrap compression prior to placing apligraf for next week. She is voicing no complaints or concerns. Her dressing will change to iodoflex over the next week in preparation for apligraf. 06/14/18-She is seen in follow-up evaluation for left great toe ulcer. Plain film x-ray performed last week was negative for osteomyelitis. Wound culture obtained last week grew strep B and OSSA; she is initiated on keflex and cefdinir today; there is erythema to the toe which could be from ace wrap compression, she has a history of wrapping too tight and has has been encouraged to maintain ace wraps that we place today. We will hold off on application of apligraf today, will apply next week after antibiotic therapy has been initiated. She admits today that she has resumed taking a shower with her foot/toe submerged in water, she has been reminded to keep foot/toe out of the bath water. She will be seen in follow up next week 06/21/18-she is seen in follow-up evaluation for left  great toe ulcer. She is tolerating antibiotic therapy with no GI disturbance. The wound is stable. Apligraf was applied today. She has been decreasing her smoking, only had 4 cigarettes yesterday and 1 today. She continues being more compliant in diabetic diet. She will follow-up next week for evaluation of site, if stable will remove at 2 weeks. 06/28/18- She is here in follow up evalution. Apligraf was placed last week, she states the dressing fell off on Tuesday and she was dressing with hydrofera blue. She is healed and will be discharged from the clinic today. She has been instructed to continue with smoking cessation, continue monitoring glucose levels, offloading for an additional 4 weeks and continue with hydrofera blue for additional two weeks for any possible microscopic opening. Readmission: 08/07/18 on evaluation today  patient presents for reevaluation concerning the ulcer of her right great toe. She was previously discharged on 06/28/18 healed. Nonetheless she states that this began to show signs of drainage she subsequently went to her primary care provider. Subsequently an x-ray was performed on 08/01/18 which was negative. The patient was also placed on antibiotics at that time. Fortunately they should have been effective for the infection. Nonetheless she's been experiencing some improvement but still has a lot of drainage coming from the wound itself. 08/14/18 on evaluation today patient's wound actually does show signs of improvement in regard to the erythema at this point. She has completed the antibiotics. With that being said we did discuss the possibility of placing her in a total contact cast as of today although I think that I may want to give this just a little bit more time to ensure nothing recurrence as far as her infection is concerned. I do not want to put in the cast and risk infection at that time if things are not completely resolved. With that being said she is gonna require some debridement today. 08/21/18 on evaluation today patient actually appears to be doing okay in regard to her toe ulcer. She's been tolerating the dressing changes without complication. With that being said it does appear that she is ready and in fact I think it's appropriate for [place] to go ahead and initiate the total contact cast today. Nonetheless she will require some sharp debridement to prepare the wound for application. Overall I feel like things have been progressing well but we do need to do something to get this to close more readily. 08/24/18 patient seen today for reevaluation after having had the total contact cast applied on Tuesday. She seems to have done very well the wound appears to be doing great and overall I'm pleased with the progress that she's made. There were no abnormal areas of rubbing from the cast  on her lower extremity. 08/30/18 on evaluation today patient actually appears to be completely healed in regard to her plantar toe ulcer. She tells me at this point she's been having a lot of issues with the cast. She almost fell a couple of times the state shall the step of her dog a couple times as well. This is been a very frustrating process for her other nonetheless she has completely healed the wound which is excellent news. Overall there does not appear to be the evidence of infection at this time which is great news. 09/11/18 evaluation today patient presents for follow-up concerning her great toe ulcer on the left which has unfortunately reopened since I last saw her which was only a couple of weeks ago. Unfortunately she was not  able to get in to get the shoe and potentially the AFO that's gonna be necessary due to her left foot drop. She continues with offloading shoe but this is not enough to prevent her from reopening it appears. When we last had her in the total contact cast she did well from a healing standpoint but unfortunately the wound reopened as soon as she came out of the cast within just a couple of weeks. 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 This is also part of the reason why she falls on a regular basis. I just do not believe that is gonna be safe for [place] to reinitiate the total contact cast as last time we had this on she fell 3 times one week which is definitely not normal for her. 09/18/18 upon evaluation today the patient actually appears to be doing about the same in regard to her toe ulcer. She did not contact [facility] as I asked her to even though I had given her the prescription. In fact she actually states that she has no idea where the prescription is. She  did apparently call [facility] and they told her that all she needed to do was bring the prescription in order to be able to be seen and work on getting the AFO for her left foot. With all that being said she still does not have an appointment and I'm not sure were things stand that regard. I will give her a new prescription today in order to contact them to get this set up. KENYETTA, WIMBISH A. ([id number]) 09/25/18 on evaluation today patient actually appears to be doing about the same in regard to her toes ulcer. She does have a small areas which seems to have a lot of callous buildup around the edge of the wound which is going to need sharp debridement today. She still is waiting to be scheduled for evaluation with [facility] for possibility of an AFO. She states there supposed to call her tomorrow to get this set up. Unfortunately it does appear that her foot specifically the toe area is showing signs of erythema. There does not appear to be any systemic infection which is in these good news. 10/02/18 on evaluation today patient actually appears to be doing about the same in regard to her toe ulcer. This really has not done too well although it's not significantly larger it's also not significantly smaller. She has been tolerating the dressing changes without complication. She actually has her appointment with [facility] and [place] tomorrow to hopefully be measured for obtaining and AFO splint. I think this would be helpful preventing this from reoccurring. We had contemplated starting the cast this week although to be honest I am reluctant to do that as she's been having nausea, vomiting, and seizure activity over the past three days. She has a history of seizures and have been told is nothing that can be done for these. With that being said I do believe that along with the seizures have the nausea vomiting which upon further questioning doesn't seem to be the normal for her and makes me concerned for the  possibility of infection or something else going on. I discussed this with the patient and her mother during the office visit today. I do not feel  the wound is effective but maybe something else. The responses this was "this just happens to her at times and we don't know why". They did not seem to be interested in going to the hospital to have this checked out further. 10/09/18 on evaluation today patient presents for follow-up concerning her ongoing toe ulcer. She has been tolerating the dressing changes without complication. Fortunately there does not appear to be any evidence of infection which is great news however I do think that the patient would benefit from going ahead for with the total contact cast. She's actually in a wheelchair today she tells me that she will use her walker if we initiate the cast. I was very specific about the fact that if we were gonna do the cast I wanted to make sure that she was using the walker in order to prevent any falls. She tells me she does not have stairs that she has to traverse on a regular basis at her home. She has not had any seizures since last week again that something that happens to her often she tells me she did talk to [employer] and they said that it may take up to three weeks to get the brace approved for her. Hopefully that will not take that long but nonetheless in the meantime I do think the cast could be of benefit. 10/12/18 on evaluation today patient appears to be doing rather well in regard to her toe ulcer. It's just been a few days and already this is significantly improved both as far as overall appearance and size. Fortunately there's no sign of infection. She is here for her first obligatory cast change. 10/19/18 Seen today for follow up and management of left great toe ulcer. Wound continues to show improvement. Noted small open area with seroussang drainage with palpation. Denies any increased pain or recent fevers during visit. She  will continue calcium alginate with offloading shoe. Denies any questions or concerns during visit. 10/26/18 on evaluation today patient appears to be doing about the same as when I last saw her in regard to her wound bed. Fortunately there does not appear to be any signs of infection. Unfortunately she continues to have a breakdown in regard to the toe region any time that she is not in the cast. It takes almost no time at all for this to happen. Nonetheless she still has not heard anything from the brace being made by [employer] as to when exactly this will be available to her. Fortunately there is no signs of infection at this time. 10/30/18 on evaluation today patient presents for application of the total contact cast as we just received him this morning. Fortunately we are gonna be able to apply this to her today which is great news. She continues to have no significant pain which is good news. Overall I do feel like things have been improving while she was the cast is when she doesn't have a cast that things get worse. She still has not really heard anything from [place] regarding her brace. 11/02/18 upon evaluation today patient's wound already appears to be doing significantly better which is good news. Fortunately there does not appear to be any signs of infection also good news. Overall I do think the total contact cast as before is helping to heal this area unfortunately it's just not gonna likely keep the area closed and healed without her getting her brace at least. Again the foot drop is a significant issue for her. 11/09/18  on evaluation today patient appears to be doing excellent in regard to her toe ulcer which in fact is completely healed. Fortunately we finally got the situation squared away with the paperwork which was needed to proceed with getting her brace approved by Medicaid. I have filled that out unfortunately that information has been sent to the orthopedic office that I worked at 2  1/2 years ago and not tired Current wound care measures. Fortunately she seems to be doing very well at this time. 11/23/18 on evaluation today patient appears to be doing More Poorly Compared to Last Time I Saw Her. At [facility] She Had Completely Healed. Currently she is continuing to have issues with reopening. She states that she just found out that the brace was approved through Medicaid now she just has to go get measured in order to have this fitted for her and then made. Subsequently she does not have an appointment for this yet that is going to complicate things we obviously cannot put her back in the cast if we do not have everything measured because they're not gonna be able to measure her foot while she is in the cast. 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 Obviously this is unfortunate and does have me somewhat worried as well. 11/30/18 on evaluation today patient's toe ulcer actually appears to be doing fairly well. The good news is she will be getting her brace in the shoes next week on Wednesday. Hopefully we will be able to get this to heal without having to go back in the cast however she may need the cast in order to get the wound completely heal and then go from there. Fortunately there's no signs of infection at this time. 12/07/18 on evaluation today patient fortunately did receive her brace and she states she could tell this definitely makes her walk better. With that being said she's been having issues with her toe where she noticed yesterday there was a lot of tissue that was loosing off this appears to be much larger than what it was previous. She also states that her leg has been read putting much across the top of her foot just about the ankle although this seems to be receiving somewhat. The total area is still red and appears to be someone infected as best I can tell. She  is previously taken Bactrim and that may be a good option for her today as well. We are gonna see what I wound culture shows as well and I think that this is definitely appropriate. With that being said outside of the culture I still need to initiate something in the interim and that's what I'm gonna go ahead and select Bactrim is a good option for her. 12/14/18 on evaluation today patient appears to be doing better in regard to her left great toe ulcer as compared to last week's evaluation. There's still some erythema although this is significantly improved which is excellent news. Overall I do believe that she is making good progress is still gonna take some time before she is where I would like her to be from the standpoint of being able to place her back into the total contact cast. Hopefully we will be where we need to be by next week. 12/21/18 on evaluation today patient actually appears to be doing poorly in regard to her toe ulcer. She's been tolerating  the dressing changes without complication. Fortunately there's no signs of systemic infection although she does have a lot of drainage from the toe ulcer and this does [name], [name] ([id number]) seem to be causing some issues at this point. She does have erythema on the distal portion of her toe that appears to be likely cellulitis. 12/28/18 on evaluation today patient actually appears to be doing a little better in my pinion in regard to her toe ulcer. With that being said she still does have some evidence of infection at this time and for her culture she had both E. coli as well as enterococcus as organisms noted on evaluation. For that reason I think that though the Keflex likely has treated the E. coli rather well this has really done nothing for the enterococcus. We are going to have to initiate treatment for this specifically. 01/04/19 on evaluation today patient's toe actually appears to be doing better from the standpoint of infection.  She currently would like to see about putting the cash back on I think that this is appropriate as long as she takes care of it and keeps it from getting wet. She is gonna have some drainage we can definitely pass this up with Drawtex and alginate to try to prevent as much drainage as possible from causing the problems. With that being said I do want to at least try her with the cast between now and Tuesday. If there any issues we can't continue to use it then I will discontinue the use of the cast at that point. 01/08/19 on evaluation today patient actually appears to be doing very well as far as her foot ulcer specifically the great toe on the left is concerned. She did have an area of rubbing on the medial aspect of her left ankle which again is from the cast. Fortunately there's no signs of infection at this point in this appears to be a very slight skin breakdown. The patient tells me she felt it rubbing but didn't think it was that bad. Fortunately there is no signs of active infection at this time which is good news. No fevers, chills, nausea, or vomiting noted at this time. 01/15/19 on evaluation today patient actually appears to be doing well in regard to her toe ulcer. Again as previous she seems to do well and she has the cast on which indicates to me that during the time she doesn't have a cast on she's putting way too much pressure on this region. Obviously I think that's gonna be an issue as with the current national emergency concerning the Covid-19 Virus it has been recommended that we discontinue the use of total contact casting by the chief medical officer of our company, Dr. Simona Huh. The reasoning is that if a patient becomes sick and cannot come into have the cast removed they could not just leave this on for an additional two weeks. Obviously the hospitals also do not want to receive patient's who are sick into the emergency department to potentially contaminate the region and spread  the Covid-19 Virus among other sick individuals within the hospital system. Therefore at this point we are suspending the use of total contact cast until the current emergency subsides. This was all discussed with the patient today as well. 01/22/19 on evaluation today patient's wound on her left great toe appears to be doing slightly worse than previously noted last week. She tells me that she has been on this quite a bit in fact she  tells me she's been awake for 38 straight hours. This is due to the fact that she's having to care for grandparents because nobody else will. She has been taking care of them for five the last seven days since I've seen her they both have dementia his is from a stroke and her grandmother's was progressive. Nonetheless she states even her mom who knows her condition and situation has only help two of those days to take care of them she's been taking care of the rest. Fortunately there does not appear to be any signs of active infection in regard to her toe at this point although obviously it doesn't look as good as it did previous. I think this is directly related to her not taking off the pressure and friction by way of taking things easy. Though I completely understand what's going on. 01/29/19 on evaluation today patient's tools are actually appears to be showing some signs of improvement today compared to last week's evaluation as far as not necessarily the overall size of the wound but the fact that she has some new skin growth in between the two ends of the wound opening. Overall I feel like she has done well she states that she had a family member give her what sounds to be a CAM walker boot which has been helpful as well. 02/05/19 on evaluation today patient's wound bed actually appears to be doing significantly better in regard to her overall appearance of the size of the wound. With that being said she is still having an issue with offloading efficiently enough to  get this to close. Apparently there is some signs of infection at this point as well unfortunately. Previously she's done well of Augmentin I really do not see anything that needs to be culture currently but there theme and cellulitis of the foot that I'm seeing I'm gonna go ahead and place her on an antibiotic today to try to help clear this up. 02/12/2019 on evaluation today patient actually appears to be doing poorly in regard to her overall wound status. She tells me she has been using her offloading shoe but actually comes in today wearing her tennis shoe with the AFO brace. Again as I previously discussed with her this is really not sufficient to allow the area to heal appropriately. Nonetheless she continues to be somewhat noncompliant and I do wonder based on what she has told my nurse in the past as to whether or not a good portion of this noncompliance may be recreational drug and alcohol related. She has had a history of heroin overdose and this was fairly recently in the past couple of months that have been seeing her. Nonetheless overall I feel like her wound looks significantly worse today compared to what it was previous. She still has significant erythema despite the Augmentin I am not sure that this is an appropriate medication for her infection I am also concerned that the infection may have gone down into her bone. 02/19/19 on evaluation today patient actually appears to be doing about the same in regard to her toe ulcer. Unfortunately she continues to show signs of bone exposure and infection at this point. There does not appear to be any evidence of worsening of the infection but I'm also not really sure that it's getting significantly better. She is on the Augmentin which should be sufficient for the Staphylococcus aureus infection that she has at this point. With that being said she may need IV antibiotics to  more appropriately treat this. We did have a discussion today about  hyperbaric option therapy. 02/28/19 on evaluation today patient actually appears to be doing much worse in regard to the wound on her left great toe as compared to even my previous evaluation last week. Unfortunately this seems to be training in a pretty poor direction. Her toe was actually now starting to angle laterally and I can actually see the entire joint area of the proximal portion of the digit where is the distal portion of the digit again is no longer even in contact with the joint line. Unfortunately there's a lot more necrotic tissue around the edge and the toe appears to be showing signs of becoming gangrenous in my pinion. I'm very concerned about were things stand at this point. She did see infectious disease and they are planning to send in a prescription for Sivextro for her and apparently this has been approved. With that being said I don't think she should avoid taking this but at the same time I'm not sure that it's gonna be sufficient to save her toe at this point. 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 03/05/19 on evaluation today patient actually appears to be doing quite well in regard to her toe all things considering. She still has bone exposed but there appears to be much less your thing on overall the appearance of the wound and the toe itself is dramatically improved. She still does have some issues currently obviously with infection she did see vascular as well and there concerned that her blood flow to the toad. For that reason they are setting up for an angiogram next week. 03/14/19 on evaluation today patient appears to be doing very poor in regard to her toe and specifically in regard to the ulceration and the fact that she's starting to notice the toe was leaning  even more towards the lateral aspect and the complete joint is visible on the proximal aspect of the joint. Nonetheless she's also noted a significant odor and the tip of the toe is turning more dark and necrotic appearing. Overall I think she is getting worse not better as far as this is concerned. For that reason I am recommending at this point that she likely needs to be seen for likely amputation. [name], [name] ([id number]) READMISSION 03/19/2021 This is a patient that we cared for in this clinic for a prolonged period of time in 2019 and 2020 with a left foot and left first toe wound. 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 Luana Shu. In December 2021 she had an ulcer on her right great toe as well as the fourth and fifth toes. 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 She also had an angiogram at that time and underwent angioplasty of the right anterior tibial artery. In any case she claims that the wound on the right foot is closed I did not look at this today which was probably an oversight although I think that should be done next week. After her surgery she developed a dehiscence but I do not see any follow-up of this. According to  Dr. Deborra Medina last review that she was out of the area being cared for by another physician but recently came back to his attention. 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 A culture of this area showed E. coli apparently before she came back to see Dr. Luana Shu she was supposed to be receiving antibiotics but she did not really take them. Nor is she offloading this area at all. Finally her last hemoglobin A1c listed in epic was in March 2022 at 14.1 she says things are a lot better since then although I am not sure. 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 She had this wound at the time. During this admission she had E. coli sepsis a TEE was negative. 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 She is still a heavy cigarette smoker. She has a history of bipolar disorder seizure disorder ABI in our clinic was 1.05 6/1; left midfoot in the setting of a TMA done previously. Round circular wound with a "knuckle" of protruding tissue. The problem is that the knuckle was not attached to any of the surrounding granulation and this probed proximally widely I removed a large portion of this tissue. This wound goes with considerable undermining laterally. I do not feel any bone there was no purulence but this is a deep wound. 6/8; in spite of the debridement I did last week. She arrives with a wound looking exactly the same. A protruding "knuckle" of tissue nonadherent to most of the surrounding tissue. There is considerable depth around this from 6-12 o'clock at 2.7 cm and undermining of 1 cm. This does not look overtly infected and the x-ray I did last week was negative for any osseous abnormalities. We have been using silver collagen 6/15; deep tissue culture I did last week showed moderate staph aureus and moderate Pseudomonas. This will definitely require prolonged antibiotic therapy. The pathology on the protuberant area was negative for malignancy fungus etc. 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 We have been using silver collagen. I am going to be prescribing Levaquin for 2 weeks. Her CT scan of the foot is down for 7/5 6/22; CT scan of the foot on 7 5. She says she has hardware in the left  leg from her previous fracture. She is on the Levaquin for the deep tissue culture I did that showed methicillin sensitive staph aureus and Pseudomonas. I gave her a 2-week supply and she will have another week. She arrives in clinic today with the same protuberant tissue however this is nonadherent to the tissue surrounding it. I am really at a loss to explain this unless there is underlying deep tissue infection 6/29; patient presents for 1 week follow-up. She has been using collagen to the wound bed. She reports taking her antibiotics as prescribed.She has no complaints or issues today. She denies signs of infection. 7/6; patient presents for one week followup. She has been using collagen to the wound bed. She states she is taking Levaquin however at times she is not able to keep it down. She denies signs of infection. 7/13; patient presents for 1 week follow-up. She has been using silver alginate to the wound  bed. She still has nausea when taking her antibiotics. She denies signs of infection. 7/20; patient presents for 1 week follow-up. She has been using silver alginate with gentamicin cream to the wound bed. She denies any issues and has no complaints today. She denies signs of infection. 7/27; patient presents for 1 week follow-up. She continues to use silver alginate with gentamicin cream to the wound bed. She reports starting her antibiotics. She has no issues or complaints. Overall she reports stability to the wound. 8/3; patient presents for 1 week follow-up. She has been using silver alginate with gentamicin cream to the wound bed. She reports completing all antibiotics. She has no issues or complaints today. She denies signs of infection. 8/17; patient presents for 2-week follow-up. He is to use silver alginate to the wound bed. She has no issues or complaints today. She denies signs of infection. She reports her pain has improved in her foot since last clinic visit 8/24; patient  presents for 1 week follow-up. She continues to use silver alginate to the wound bed. She has no issues or complaints. She denies signs of infection. Pain is stable. 9/7; patient presents for follow-up. She missed her last week appointment due to feeling ill. She continues to use silver alginate. She has a new wound to the right lower extremity that is covered in eschar. She states It occurred over the past week and has no idea how it started. She currently denies signs of infection. 9/14; patient presents for follow-up. To the left foot wound she has been using gentamicin cream and silver alginate. To the right lower extremity wound she has been keeping this covered and has not obtain Santyl. 9/21; patient presents for follow-up. She reports using gentamicin cream and silver alginate to the left foot and Santyl to the right lower extremity wound. She has no issues or complaints today. She denies signs of infection. 9/28; patient presents for follow-up. She reports a new wound to her right heel. She states this occurred a few days ago and is progressively gotten worse. She has been trying to clean the area with a Q-tip and Santyl. She reports stability in the other 2 wounds. She has been using gentamicin cream and silver alginate to the left foot and Santyl to the right lower extremity wound. 10/12; patient presents for follow-up. She reports improvement to the wound beds. She is seeing vein and vascular to discuss the potential of a left BKA. She states they are going to do an arteriogram. She continues to use silver alginate with dressing changes to her wounds. 11/2; patient presents for follow-up. She states she has not been doing dressing changes to the wound beds. She states she is not able to offload the areas. She reports chronic pain to her left foot wound. 11/9; patient presents for follow-up. She came in with only socks on. She states she forgot to put on shoes. It is unclear if she is  doing any dressing changes. She currently denies systemic signs of infection. 11/16; patient presents for follow-up. She came again only with socks on. She states she does not wear shoes ever. It is unclear if she does dressing changes. She currently denies systemic signs of infection. 11/23; patient presents for follow-up. She wore her shoes today. It still unclear exactly what dressing she is using for each wound but she did states she obtained Dakin's solution and has been using this to the left foot wound. She currently denies signs of infection. Fretwell, Velita  A. ([id number]) 11/30; patient presents for follow-up. She has no issues or complaints today. She currently denies signs of infection. 12/7; patient presents for follow-up. She has no issues or complaints today. She has been using Hydrofera Blue to the right heel wound and Dakin solution to the left foot wound. Her right anterior leg wound is healed. She currently denies signs of infection. 12/14; patient presents for follow-up. She has been using Hydrofera Blue to the right heel and Dakin's to the left foot wounds. She has no issues or complaints today. She denies signs of infection. 12/21; patient presents for follow-up. She reports using Hydrofera Blue to the right heel and Dakin's to the left foot wound. She denies signs of infection. 12/28; patient presents for follow-up. She continues to use Dakin's to the left foot wound and Hydrofera Blue to the right heel wound. She denies signs of infection. 1/4; patient presents for follow-up. She has no issues or complaints today. She denies signs of infection. 1/11; patient presents for follow-up. It is unclear if she has been dressing these wounds over the past week. She currently denies signs of infection. 1/18; patient presents for follow-up. She states she has been using Dakin's wet-to-dry dressings to the left foot. She has been using Hydrofera Blue to the right foot foot wound. She  states that the anterior right leg wound has reopened and draining serous fluid. She denies signs of infection. 1/25; patient presents for follow-up. She has no issues or complaints today. 2/1; patient presents for follow-up. She has no issues or complaints today. She denies signs of infection. 2/8; patient presents for follow-up. She has lost her surgical shoes. She did not have a dressing to the right heel wound. She currently denies signs of infection. 2/15; patient presents for follow-up. She reports more pain to the right heel today. She denies purulent drainage Or fever/chills 2/22; patient presents for follow-up. She reports taking clindamycin over the past week. She states that she continues to have pain to her right heel. She reports purulent drainage. Readmission 03/16/2022 Ms. Monette Omara is a 46 year old female with a past medical history of type 2 diabetes, osteomyelitis to her feet, chronic systolic heart failure and bipolar disorder that presents to the clinic for bilateral feet wounds and right lower extremity wound. She was last seen in our clinic on 12/15/2021. At that time she had purulent drainage coming out of her right plantar foot and I recommended she go to the ED. She states she went to [facility] and has been there for the past 3 months. I cannot see the records. She states she had OR debridement and was on several weeks of IV antibiotics while inpatient. Since discharge she has not been taking care of the wound beds. She had nothing on her feet other than socks today. She currently denies signs of infection. 5/31; patient presents for follow-up. She has been using Dakin's wet-to-dry dressings to the wound beds on her feet bilaterally and antibiotic ointment to the right anterior leg wound. She had a wound culture done at last clinic visit that showed moderate Pseudomonas aeruginosa sensitive to ciprofloxacin. She currently denies systemic signs of  infection. 6/14; patient presents for follow-up. She received Keystone 5 days ago and has been using this on the wound beds. She states that last week she had to go to the hospital because she had increased warmth and erythema to the right foot. She was started on 2 oral antibiotics. She states she has been  taking these. She currently denies systemic signs of infection. She has no issues or complaints today. 6/21; patient presents for follow-up. She states she has been using Keystone antibiotics to the wound beds. She has no issues or complaints today. She denies signs of infection. 6/28; patient presents for follow-up. She has been using Keystone antibiotics to the wound beds. She has no issues or complaints today. 7/12; patient presents for follow-up. Has been using Keystone antibiotics to the wound beds with calcium alginate. She has no issues or complaints today. She never followed up with her orthopedic surgeon who did the OR debridement to the right foot. We discussed the total contact cast for the left foot and patient would like to do this next week. 7/19; patient presents for follow-up. She has been using Keystone antibiotics with calcium alginate to the wound beds. She has no issues or complaints today. Patient is in agreement to do the total contact cast of the left foot today. She knows to return later this week for the obligatory cast change. Objective Constitutional Vitals Time Taken: 9:03 AM, Temperature: 98.3 F, Pulse: 65 bpm, Respiratory Rate: 18 breaths/min, Blood Pressure: 119/77 mmHg. ELLIANNAH, WAYMENT A. ([id number]) General Notes: Right foot: To the plantar heel there is an incision site with increased depth and nonviable tissue at the surface. No probing to bone. Left foot: To the medial aspect there is an open wound with granulation tissue, nonviable tissue and circumferential callus. No surrounding signs of infection to any of the wound beds. Integumentary (Hair,  Skin) Wound #11 status is Open. Original cause of wound was Surgical Injury. The date acquired was: 12/01/2021. The wound has been in treatment 8 weeks. The wound is located on the [place]. The wound measures 2.1cm length x 0.5cm width x 0.7cm depth; 0.825cm^2 area and 0.577cm^3 volume. There is Fat Layer (Subcutaneous Tissue) exposed. There is a medium amount of serosanguineous drainage noted. The wound margin is distinct with the outline attached to the wound base. There is medium (34-66%) pink, pale granulation within the wound bed. There is a medium (34-66%) amount of necrotic tissue within the wound bed including Adherent Slough. Wound #12 status is Open. Original cause of wound was Pressure Injury. The date acquired was: 03/16/2020. The wound has been in treatment 8 weeks. The wound is located on the [place]. The wound measures 3.6cm length x 1.5cm width x 0.2cm depth; 4.241cm^2 area and 0.848cm^3 volume. There is Fat Layer (Subcutaneous Tissue) exposed. There is a medium amount of serous drainage noted. The wound margin is flat and intact. There is large (67-100%) red, pink granulation within the wound bed. There is a small (1-33%) amount of necrotic tissue within the wound bed including Adherent Slough. 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 I debrided nonviable tissue. I recommended continuing Keystone and calcium alginate. We will place the total contact cast to the left leg today. She will follow-up later this week for her  cast exchange. Procedures Wound #11 Pre-procedure diagnosis of Wound #11 is an Open Surgical Wound located on the Right,Plantar Foot . There was a Excisional Skin/Subcutaneous Tissue Debridement  with a total area of 2.5 sq cm performed by Kalman Shan, MD. With the following instrument(s): Curette to remove Viable and Non-Viable tissue/material. Material removed includes Callus, Subcutaneous Tissue, and Slough. A time out was conducted at 09:47, prior to the start of the procedure. A Minimum amount of bleeding was controlled with Pressure. The procedure was tolerated well. Post Debridement Measurements: 2.1cm length x 0.5cm width x 0.7cm depth; 0.577cm^3 volume. Character of Wound/Ulcer Post Debridement is stable. 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 There was a Excisional Skin/Subcutaneous Tissue Debridement with a total area of 11.25 sq cm performed by Kalman Shan, MD. With the following instrument(s): Curette to remove Viable and Non-Viable tissue/material. Material removed includes Callus, Subcutaneous Tissue, and Slough. A time out was conducted at 09:38, prior to the start of the procedure. A Minimum amount of bleeding was controlled with Pressure. The procedure was tolerated well. Post Debridement Measurements: 3.8cm length x 1.6cm width x 0.2cm depth; 0.955cm^3 volume. Character of Wound/Ulcer Post Debridement is stable. Severity of Tissue Post Debridement is: Fat layer exposed. 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 There was a Total Contact Cast Procedure by Kalman Shan, MD. Post procedure Diagnosis Wound #12: Same  as Pre-Procedure Gaudin, Clinton ([id number]) Plan Follow-up Appointments: Return Appointment in 1 week. Nurse Visit as needed Bathing/ Shower/ Hygiene: Wash wounds with antibacterial soap and water. No tub bath. Anesthetic (Use 'Patient Medications' Section for Anesthetic Order Entry): Lidocaine applied to wound bed Edema Control - Lymphedema / Segmental Compressive Device / Other: Elevate, Exercise Daily and Avoid Standing for Long Periods of Time. Elevate legs to the level of the heart and pump ankles as often as possible Elevate leg(s) parallel to the floor when sitting. Off-Loading: Total Contact Cast to Left Lower Extremity - contact cast #1 applied Don't get cast wet Other: - keep pressure off of feet. Medications-Please add to medication list.: [employer] - Keystone gel WOUND #11: - Foot Wound Laterality: Plantar, Right Cleanser: Soap and Water 1 x Per Day/30 Days Discharge Instructions: Gently cleanse wound with antibacterial soap, rinse and pat dry prior to dressing wounds Topical: keystone gel 1 x Per Day/30 Days Primary Dressing: Aquacel Extra Hydrofiber Dressing, 4x5 (in/in) (Generic) 1 x Per Day/30 Days Discharge Instructions: packed into wound Secondary Dressing: ABD Pad 5x9 (in/in) (Generic) 1 x Per Day/30 Days Discharge Instructions: Cover with ABD pad Secured With: Medipore Tape - 38M Medipore H Soft Cloth Surgical Tape, 2x2 (in/yd) (Generic) 1 x Per Day/30 Days Secured With: Kerlix Roll Sterile or Non-Sterile 6-ply 4.5x4 (yd/yd) (Generic) 1 x Per Day/30 Days Discharge Instructions: Apply Kerlix as directed WOUND #12: - Foot Wound Laterality: Plantar, Left, Medial Cleanser: Soap and Water 1 x Per Day/30 Days Discharge Instructions: Gently cleanse wound with antibacterial soap, rinse and pat dry prior to dressing wounds Topical: keystone gel 1 x Per Day/30 Days Discharge Instructions: apply very thin layer to wound Primary Dressing: Aquacel Extra  Hydrofiber Dressing, 4x5 (in/in) (Generic) 1 x Per Day/30 Days Discharge Instructions: placed over keystone gel Secondary Dressing: ABD Pad 5x9 (in/in) (Generic) 1 x Per Day/30 Days Discharge Instructions: Cover with ABD pad Secured With: Medipore Tape - 38M Medipore H Soft Cloth  Surgical Tape, 2x2 (in/yd) (Generic) 1 x Per Day/30 Days Secured With: Kerlix Roll Sterile or Non-Sterile 6-ply 4.5x4 (yd/yd) (Generic) 1 x Per Day/30 Days Discharge Instructions: Apply Kerlix as directed 1. In office sharp debridement 2. Calcium alginate and Keystone antibiotic 3. Total contact cast placed in standard fashion 4. Follow-up Friday and then next week Electronic Signature(s) Signed: 05/11/2022 12:03:59 PM By: Kalman Shan DO Entered By: Kalman Shan on 05/11/2022 11:04:48 [name] ([id number]) -------------------------------------------------------------------------------- ROS/PFSH Details Patient Name: Heather Dear A. Date of Service: 05/11/2022 9:00 AM Medical Record Number: [id number] Patient Account Number: [id number] Date of Birth/Sex: [birth year] (46 y.o. F) Treating RN: Levora Dredge Primary Care Provider: Raelene Bott Other Clinician: Massie Kluver Referring Provider: Raelene Bott Treating Provider/Extender: Yaakov Guthrie in Treatment: 8 Information Obtained From Patient Ear/Nose/Mouth/Throat Medical History: Positive for: Chronic sinus problems/congestion; Middle ear problems Hematologic/Lymphatic Medical History: Positive for: Anemia Respiratory Medical History: Positive for: Chronic Obstructive Pulmonary Disease (COPD) Cardiovascular Medical History: Positive for: Congestive Heart Failure - EF 33% Endocrine Medical History: Positive for: Type II Diabetes Time with diabetes: 14 years Treated with: Insulin, Oral agents Blood sugar tested every day: No Blood sugar testing results: Bedtime: 176 Genitourinary Medical History: Positive for: End  Stage Renal Disease Integumentary (Skin) Medical History: Positive for: History of pressure wounds Neurologic Medical History: Positive for: Neuropathy - feel and legs HBO Extended History Items Ear/Nose/Mouth/Throat: Ear/Nose/Mouth/Throat: Chronic sinus problems/congestion Middle ear problems Immunizations Pneumococcal Vaccine: Received Pneumococcal Vaccination: No Hoppe, Janica A. ([id number]) Implantable Devices No devices added Family and Social History Current every day smoker; Marital Status - Single; Alcohol Use: Never; Drug Use: Prior History; Caffeine Use: Daily Electronic Signature(s) Signed: 05/11/2022 12:03:59 PM By: Kalman Shan DO Signed: 05/11/2022 3:19:57 PM By: Levora Dredge Entered By: Kalman Shan on 05/11/2022 11:05:21 Heather Dear A. ([id number]) -------------------------------------------------------------------------------- Total Contact Cast Details Patient Name: Heather Dear A. Date of Service: 05/11/2022 9:00 AM Medical Record Number: [id number] Patient Account Number: [id number] Date of Birth/Sex: [birth year] (46 y.o. F) Treating RN: Levora Dredge Primary Care Provider: Raelene Bott Other Clinician: Massie Kluver Referring Provider: Raelene Bott Treating Provider/Extender: Yaakov Guthrie in Treatment: 8 Total Contact Cast Applied for Wound Assessment: Wound #12 Left,Medial,Plantar Foot Performed By: Physician Kalman Shan, MD Post Procedure Diagnosis Same as Pre-procedure Electronic Signature(s) Signed: 05/11/2022 12:03:59 PM By: Kalman Shan DO Signed: 05/11/2022 4:21:04 PM By: Massie Kluver Entered By: Massie Kluver on 05/11/2022 09:51:59 Toothaker, Jemimah A. ([id number]) -------------------------------------------------------------------------------- [place] Details Patient Name: Heather Dear A. Date of Service: 05/11/2022 Medical Record Number: [id number] Patient Account Number: [id number] Date of Birth/Sex:  July 03, 1976 (46 y.o. F) Treating RN: Levora Dredge Primary Care Provider: Raelene Bott Other Clinician: Massie Kluver Referring Provider: Raelene Bott Treating Provider/Extender: Yaakov Guthrie in Treatment: 8 Diagnosis Coding ICD-10 Codes Code Description [phone] 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ity 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.621 Type 2 diabetes mellitus with foot ulcer E11.42 Type 2 diabetes mellitus with diabetic polyneuropathy L97.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 of skin Facility Procedures CPT4 Code: [id number] Description: 11042 - DEB SUBQ TISSUE 20 SQ CM/< Modifier: Quantity: 1 CPT4 Code: Description: ICD-10 Diagnosis Description 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 L97.512 Non-pressure chronic ulcer of other part of right foot with fat layer expo E11.621 Type 2 diabetes mellitus with foot  ulcer Modifier: severity  sed Quantity: Physician Procedures CPT4 Code: [id number] Description: 68127 - WC PHYS SUBQ TISS 20 SQ CM Modifier: Quantity: 1 CPT4 Code: Description: ICD-10 Diagnosis Description 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 L97.512 Non-pressure chronic ulcer of other part of right foot with fat layer expo E11.621 Type 2 diabetes mellitus with foot  ulcer Modifier: severity sed Quantity: Electronic Signature(s) Signed: 05/11/2022 12:03:59 PM By: Kalman Shan DO Entered By: Kalman Shan on 05/11/2022 11:05:02

## 2022-05-13 ENCOUNTER — Encounter: Payer: Medicaid Other | Admitting: Physician Assistant

## 2022-05-13 DIAGNOSIS — E11621 Type 2 diabetes mellitus with foot ulcer: Secondary | ICD-10-CM | POA: Diagnosis not present

## 2022-05-13 NOTE — Progress Notes (Addendum)
[name] ([id number]) Visit Report for 05/13/2022 Arrival Information Details Patient Name: [name], [name] Date of Service: 05/13/2022 10:30 AM Medical Record Number: [id number] Patient Account Number: [id number] Date of Birth/Sex: 23-Dec-1975 (46 y.o. F) Treating RN: [name] Primary Care [name]: [name] Other Clinician: Massie [name] Referring [name]: [name] Treating [name]/Extender: [name] in Treatment: 8 Visit Information History Since Last Visit All ordered tests and consults were completed: No Patient Arrived: Wheel Chair Added or deleted any medications: No Arrival Time: 10:37 Any new allergies or adverse reactions: No Transfer Assistance: EasyPivot Patient Lift Had a fall or experienced change in Yes activities of daily living that may affect risk of falls: Hospitalized since last visit: No Pain Present Now: Yes Electronic Signature(s) Signed: 05/13/2022 4:34:39 PM By: [name] Entered By: [name] on 05/13/2022 10:46:31 [name], [name] ([id number]) -------------------------------------------------------------------------------- Clinic Level of Care Assessment Details Patient Name: Heather Dear A. Date of Service: 05/13/2022 10:30 AM Medical Record Number: [id number] Patient Account Number: [id number] Date of Birth/Sex: 29-Sep-1976 (46 y.o. F) Treating RN: [name] Primary Care [name]: [name] Other Clinician: Massie [name] Referring [name]: [name] Treating [name]/Extender: [name] in Treatment: 8 Clinic Level of Care Assessment Items TOOL 1 Quantity Score _0  - Use when EandM and Procedure is performed on INITIAL visit 0 ASSESSMENTS - Nursing Assessment / Reassessment _1  - General Physical Exam (combine w/ comprehensive assessment (listed just below) when performed on new 0 pt. evals) _2  - 0 Comprehensive Assessment (HX, ROS, Risk Assessments, Wounds Hx, etc.) ASSESSMENTS -  Wound and Skin Assessment / Reassessment _3  - Dermatologic / Skin Assessment (not related to wound area) 0 ASSESSMENTS - Ostomy and/or Continence Assessment and Care _4  - Incontinence Assessment and Management 0 _5  - 0 Ostomy Care Assessment and Management (repouching, etc.) PROCESS - Coordination of Care _6  - Simple Patient / Family Education for ongoing care 0 _7  - 0 Complex (extensive) Patient / Family Education for ongoing care _8  - 0 Staff obtains Programmer, systems, Records, Test Results / Process Orders _9  - 0 Staff telephones HHA, Nursing Homes / Clarify orders / etc _10  - 0 Routine Transfer to another Facility (non-emergent condition) _11  - 0 Routine Hospital Admission (non-emergent condition) _12  - 0 New Admissions / Biomedical engineer / Ordering NPWT, Apligraf, etc. _13  - 0 Emergency Hospital Admission (emergent condition) PROCESS - Special Needs _14  - Pediatric / Minor Patient Management 0 _15  - 0 Isolation Patient Management _16  - 0 Hearing / Language / Visual special needs _17  - 0 Assessment of Community assistance (transportation, D/C planning, etc.) _18  - 0 Additional assistance / Altered mentation _19  - 0 Support Surface(s) Assessment (bed, cushion, seat, etc.) INTERVENTIONS - Miscellaneous _20  - External ear exam 0 _21  - 0 Patient Transfer (multiple staff / Civil Service fast streamer / Similar devices) _22  - 0 Simple Staple / Suture removal (25 or less) _23  - 0 Complex Staple / Suture removal (26 or more) _24  - 0 Hypo/Hyperglycemic Management (do not check if billed separately) _25  - 0 Ankle / Brachial Index (ABI) - do not check if billed separately Has the patient been seen at the hospital within the last three years: Yes Total Score: 0 Level Of Care: ____ [name] ([id number]) Electronic Signature(s) Signed: 05/13/2022 4:34:39 PM By: [name] Entered By: [name] on 05/13/2022 11:18:25 [name], [name]  ([id number]) -------------------------------------------------------------------------------- Encounter Discharge Information Details Patient Name: Heather Dear A. Date of Service: 05/13/2022 10:30 AM Medical Record Number: [id number] Patient Account Number:  [id number] Date of Birth/Sex: 22-Nov-1975 (46 y.o. F) Treating RN: [name] Primary Care [name]: [name] Other Clinician: Massie [name] Referring [name]: [name] Treating [name]/Extender: [name] in Treatment: 8 Encounter Discharge Information Items Discharge Condition: Stable Ambulatory Status: Wheelchair Discharge Destination: Home Transportation: Private Auto Accompanied By: mother Schedule Follow-up Appointment: Yes Clinical Summary of Care: Electronic Signature(s) Signed: 05/13/2022 4:34:39 PM By: [name] Entered By: [name] on 05/13/2022 11:50:31 [name], [name] ([id number]) -------------------------------------------------------------------------------- Lower Extremity Assessment Details Patient Name: Heather Dear A. Date of Service: 05/13/2022 10:30 AM Medical Record Number: [id number] Patient Account Number: [id number] Date of Birth/Sex: 10/01/1976 (46 y.o. F) Treating RN: [name] Primary Care Brylan Seubert: [name] Other Clinician: Massie [name] Referring Demani Mcbrien: [name] Treating Lovelee Forner/Extender: [name] in Treatment: 8 Electronic Signature(s) Signed: 05/13/2022 12:58:04 PM By: Gretta Cool BSN, RN, CWS, [name] RN, BSN Signed: 05/13/2022 4:34:39 PM By: [name] Entered By: [name] on 05/13/2022 11:07:59 [name], [name] ([id number]) -------------------------------------------------------------------------------- Multi Wound Chart Details Patient Name: Heather Dear A. Date of Service: 05/13/2022 10:30 AM Medical Record Number: [id number] Patient Account Number: [id number] Date of Birth/Sex: 1976-01-08 (46 y.o. 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 Comorbid History: Chronic sinus problems/congestion, Chronic sinus problems/congestion, N/A Middle ear problems, Anemia, Middle ear problems, Anemia, Chronic Obstructive Pulmonary Chronic Obstructive Pulmonary Disease (COPD), Congestive Heart Disease (COPD), Congestive Heart Failure, Type II Diabetes, End Stage Failure, Type II Diabetes, End Stage Renal Disease, History of pressure Renal Disease, History of pressure wounds, Neuropathy wounds, Neuropathy Date Acquired: 12/01/2021 03/16/2020 N/A Weeks of Treatment: 8 8 N/A Wound Status: Open Open N/A Wound Recurrence: No No N/A Measurements L x W x D (cm) 2.3x0.5x0.9 2.5x1.5x0.2 N/A Area (cm) : 0.903 2.945 N/A Volume (cm) : 0.813 0.589 N/A % Reduction in Area: 51.10% 37.50% N/A % Reduction in Volume: 51.10% 37.50% N/A Classification: Full Thickness Without Exposed Grade 3 N/A Support Structures Exudate Amount: Medium Medium N/A Exudate Type: Serosanguineous Serous N/A Exudate Color: red, brown amber N/A Wound Margin: Distinct, outline attached Flat and Intact N/A Granulation Amount: Medium (34-66%) Large (67-100%) N/A Granulation Quality: Pink, Pale Red, Pink N/A Necrotic Amount: Medium (34-66%) Small (1-33%) N/A Exposed Structures: Fat Layer (Subcutaneous Tissue): Fat Layer (Subcutaneous Tissue): N/A Yes Yes Fascia: No Fascia: No Tendon: No Tendon: No Muscle: No Muscle:  No Joint: No Joint: No Bone: No Bone: No Epithelialization: Small (1-33%) Small (1-33%) N/A Treatment Notes [name], [name] ([id number]) Electronic Signature(s) Signed: 05/13/2022 4:34:39 PM By: [name] Entered By: [name] on 05/13/2022 11:08:12 [name] ([id number]) -------------------------------------------------------------------------------- [place] Details Patient Name: Heather Dear A. Date of Service: 05/13/2022 10:30 AM Medical Record Number: [id number] Patient Account Number: [id number] Date of Birth/Sex: [birth year] (46 y.o. F) Treating RN: [name] Primary Care Pasqual Farias: [name] Other Clinician: Massie [name] Referring Brodi Nery: [name] Treating Salina Stanfield/Extender: [name] in Treatment: 8 Active Inactive Abuse / Safety / Falls / Self Care Management Nursing Diagnoses: History of Falls Potential for falls Potential for injury related to falls Goals: Patient will not develop complications from immobility Date Initiated:  03/17/2022 Date Inactivated: 04/20/2022 Target Resolution Date: 03/16/2022 Goal Status: Met Patient/caregiver will verbalize understanding of skin care regimen Date Initiated: 03/17/2022 Target Resolution Date: 03/16/2022 Goal Status: Active Patient/caregiver will verbalize/demonstrate measure taken to improve self care Date Initiated: 03/17/2022 Target Resolution Date: 03/16/2022 Goal Status: Active Interventions: Assess fall risk on admission and as needed Provide education on basic hygiene Provide education on personal and home safety Notes: Necrotic Tissue Nursing Diagnoses: Impaired tissue integrity related to necrotic/devitalized tissue Knowledge deficit related to management of necrotic/devitalized tissue Goals: Necrotic/devitalized tissue will be minimized in the wound bed Date Initiated: 03/17/2022 Date Inactivated: 05/04/2022 Target Resolution Date: 03/16/2022 Goal  Status: Met Patient/caregiver will verbalize understanding of reason and process for debridement of necrotic tissue Date Initiated: 03/17/2022 Target Resolution Date: 03/16/2022 Goal Status: Active Interventions: Provide education on necrotic tissue and debridement process Treatment Activities: Apply topical anesthetic as ordered : 03/16/2022 Excisional debridement : 03/16/2022 Notes: Osteomyelitis Nursing Diagnoses: Infection: osteomyelitis Knowledge deficit related to disease process and management [name], [name] ([id number]) Goals: Patient/caregiver will verbalize understanding of disease process and disease management Date Initiated: 03/17/2022 Date Inactivated: 04/20/2022 Target Resolution Date: 03/16/2022 Goal Status: Met Patient's osteomyelitis will resolve Date Initiated: 03/17/2022 Target Resolution Date: 03/16/2022 Goal Status: Active Signs and symptoms for osteomyelitis will be recognized and promptly addressed Date Initiated: 03/17/2022 Target Resolution Date: 03/16/2022 Goal Status: Active Interventions: Assess for signs and symptoms of osteomyelitis resolution every visit Provide education on osteomyelitis Treatment Activities: Systemic antibiotics : 03/16/2022 Notes: Wound/Skin Impairment Nursing Diagnoses: Impaired tissue integrity Knowledge deficit related to smoking impact on wound healing Knowledge deficit related to ulceration/compromised skin integrity Goals: Patient/caregiver will verbalize understanding of skin care regimen Date Initiated: 03/17/2022 Date Inactivated: 04/20/2022 Target Resolution Date: 03/16/2022 Goal Status: Met Ulcer/skin breakdown will have a volume reduction of 30% by week 4 Date Initiated: 03/17/2022 Target Resolution Date: 04/13/2022 Goal Status: Active Ulcer/skin breakdown will have a volume reduction of 50% by week 8 Date Initiated: 03/17/2022 Target Resolution Date: 05/04/2022 Goal Status: Active Ulcer/skin breakdown will have a  volume reduction of 80% by week 12 Date Initiated: 03/17/2022 Target Resolution Date: 06/01/2022 Goal Status: Active Ulcer/skin breakdown will heal within 14 weeks Date Initiated: 03/17/2022 Target Resolution Date: 06/15/2022 Goal Status: Active Interventions: Assess ulceration(s) every visit Provide education on ulcer and skin care Treatment Activities: Skin care regimen initiated : 03/16/2022 Notes: Electronic Signature(s) Signed: 05/13/2022 12:58:04 PM By: Gretta Cool, BSN, RN, CWS, [name] RN, BSN Signed: 05/13/2022 4:34:39 PM By: [name] Entered By: [name] on 05/13/2022 11:08:04 [name], [name] ([id number]) -------------------------------------------------------------------------------- Pain Assessment Details Patient Name: Heather Dear A. Date of Service: 05/13/2022 10:30 AM Medical Record Number: [id number] Patient Account Number: [id number] Date of Birth/Sex: [birth year] (46 y.o. F) Treating RN: [name] Primary Care Adib Wahba: [name] Other Clinician: Massie [name] Referring Gaile Allmon: [name] Treating Kwanza Cancelliere/Extender: [name] in Treatment: 8 Active Problems Location of Pain Severity and Description of Pain Patient Has Paino Yes Site Locations Pain Location: Generalized Pain Duration of the Pain. Constant / Intermittento Constant Rate the pain. Current Pain Level: 9 Character of Pain Describe the Pain: Aching, Throbbing Pain Management and Medication Current Pain Management: Medication: Yes Rest: Yes Notes Pain in left lower leg from fall yesterday due to seizure Electronic Signature(s) Signed: 05/13/2022 12:58:04 PM By: Gretta Cool, BSN, RN, CWS, [name] RN, BSN Signed: 05/13/2022 4:34:39 PM By: [name] Entered By: [name] on 05/13/2022 11:06:11 [name] ([id number]) -------------------------------------------------------------------------------- Patient/Caregiver Education Details Patient Name: Heather Dear  A. Date  of Service: 05/13/2022 10:30 AM Medical Record Number: [id number] Patient Account Number: [id number] Date of Birth/Gender: [birth year] (46 y.o. F) Treating RN: [name] Primary Care Physician: [name] Other Clinician: Massie [name] Referring Physician: Raelene [name] Treating Physician/Extender: [name] in Treatment: 8 Education Assessment Education Provided To: Patient and Caregiver Education Topics Provided Wound/Skin Impairment: Handouts: Other: continue wound care as directed Electronic Signature(s) Signed: 05/13/2022 4:34:39 PM By: [name] Entered By: [name] on 05/13/2022 11:19:43 [name], [name] ([id number]) -------------------------------------------------------------------------------- Wound Assessment Details Patient Name: Heather Dear A. Date of Service: 05/13/2022 10:30 AM Medical Record Number: [id number] Patient Account Number: [id number] Date of Birth/Sex: 02-04-1976 (46 y.o. F) Treating RN: [name] Primary Care Daron Stutz: [name] Other Clinician: Massie [name] Referring Shawan Corella: [name] Treating Soni Kegel/Extender: [name] in Treatment: 8 Wound Status Wound Number: 11 Primary Open Surgical Wound Etiology: Wound Location: Right, Plantar Foot Wound Open Wounding Event: Surgical Injury Status: Date Acquired: 12/01/2021 Comorbid Chronic sinus problems/congestion, Middle ear problems, Weeks Of Treatment: 8 History: Anemia, Chronic Obstructive Pulmonary Disease (COPD), Clustered Wound: No Congestive Heart Failure, Type II Diabetes, End Stage Renal Disease, History of pressure wounds, Neuropathy Photos Wound Measurements Length: (cm) 2.3 Width: (cm) 0.5 Depth: (cm) 0.9 Area: (cm) 0.903 Volume: (cm) 0.813 % Reduction in Area: 51.1% % Reduction in Volume: 51.1% Epithelialization: Small (1-33%) Wound Description Classification: Full Thickness Without Exposed Support Structures Wound Margin:  Distinct, outline attached Exudate Amount: Medium Exudate Type: Serosanguineous Exudate Color: red, brown Foul Odor After Cleansing: No Slough/Fibrino Yes Wound Bed Granulation Amount: Medium (34-66%) Exposed Structure Granulation Quality: Pink, Pale Fascia Exposed: No Necrotic Amount: Medium (34-66%) Fat Layer (Subcutaneous Tissue) Exposed: Yes Necrotic Quality: Adherent Slough Tendon Exposed: No Muscle Exposed: No Joint Exposed: No Bone Exposed: No Treatment Notes Wound #11 (Foot) Wound Laterality: Plantar, Right Cleanser Soap and Water Discharge Instruction: Gently cleanse wound with antibacterial soap, rinse and pat dry prior to dressing wounds Furgeson, Ashli A. ([id number]) Peri-Wound Care Topical keystone gel Primary Dressing Aquacel Extra Hydrofiber Dressing, 4x5 (in/in) Discharge Instruction: packed into wound Secondary Dressing ABD Pad 5x9 (in/in) Discharge Instruction: Cover with ABD pad Secured With Medipore Tape - 23M Medipore H Soft Cloth Surgical Tape, 2x2 (in/yd) Kerlix Roll Sterile or Non-Sterile 6-ply 4.5x4 (yd/yd) Discharge Instruction: Apply Kerlix as directed Compression Wrap Compression Stockings Add-Ons Electronic Signature(s) Signed: 05/13/2022 12:58:04 PM By: Gretta Cool, BSN, RN, CWS, [name] RN, BSN Signed: 05/13/2022 4:34:39 PM By: [name] Entered By: [name] on 05/13/2022 11:06:46 [name], Mora. ([id number]) -------------------------------------------------------------------------------- Wound Assessment Details Patient Name: Heather Dear A. Date of Service: 05/13/2022 10:30 AM Medical Record Number: [id number] Patient Account Number: [id number] Date of Birth/Sex: 12-May-1976 (46 y.o. F) Treating RN: [name] Primary Care Eldor Conaway: [name] Other Clinician: Massie [name] Referring Marianne Golightly: [name] Treating Fabiano Ginley/Extender: [name] in Treatment: 8 Wound Status Wound Number: 12 Primary Diabetic Wound/Ulcer  of the Lower Extremity Etiology: Wound Location: Left, Medial, Plantar Foot Wound Open Wounding Event: Pressure Injury Status: Date Acquired: 03/16/2020 Comorbid Chronic sinus problems/congestion, Middle ear problems, Weeks Of Treatment: 8 History: Anemia, Chronic Obstructive Pulmonary Disease (COPD), Clustered Wound: No Congestive Heart Failure, Type II Diabetes, End Stage Renal Disease, History of pressure wounds, Neuropathy Photos Wound Measurements Length: (cm) 2.5 Width: (cm) 1.5 Depth: (cm) 0.2 Area: (cm) 2.945 Volume: (cm) 0.589 % Reduction in Area: 37.5% % Reduction in Volume: 37.5% Epithelialization: Small (1-33%) Wound Description Classification: Grade 3 Wound Margin: Flat and Intact Exudate Amount:  Medium Exudate Type: Serous Exudate Color: amber Foul Odor After Cleansing: No Slough/Fibrino Yes Wound Bed Granulation Amount: Large (67-100%) Exposed Structure Granulation Quality: Red, Pink Fascia Exposed: No Necrotic Amount: Small (1-33%) Fat Layer (Subcutaneous Tissue) Exposed: Yes Necrotic Quality: Adherent Slough Tendon Exposed: No Muscle Exposed: No Joint Exposed: No Bone Exposed: No Treatment Notes Wound #12 (Foot) Wound Laterality: Plantar, Left, Medial Cleanser Soap and Water Discharge Instruction: Gently cleanse wound with antibacterial soap, rinse and pat dry prior to dressing wounds [name], [name] ([id number]) Peri-Wound Care Topical keystone gel Discharge Instruction: apply very thin layer to wound Primary Dressing Aquacel Extra Hydrofiber Dressing, 4x5 (in/in) Discharge Instruction: placed over keystone gel Secondary Dressing ABD Pad 5x9 (in/in) Discharge Instruction: Cover with ABD pad Secured With Medipore Tape - 40M Medipore H Soft Cloth Surgical Tape, 2x2 (in/yd) Kerlix Roll Sterile or Non-Sterile 6-ply 4.5x4 (yd/yd) Discharge Instruction: Apply Kerlix as directed Compression Wrap Compression Stockings Add-Ons Electronic  Signature(s) Signed: 05/13/2022 12:58:04 PM By: Gretta Cool, BSN, RN, CWS, [name] RN, BSN Signed: 05/13/2022 4:34:39 PM By: [name] Entered By: [name] on 05/13/2022 11:07:47 Dieguez, Marlenne A. ([id number]) -------------------------------------------------------------------------------- [place] Details Patient Name: Heather Dear A. Date of Service: 05/13/2022 10:30 AM Medical Record Number: [id number] Patient Account Number: [id number] Date of Birth/Sex: [birth year] (46 y.o. F) Treating RN: [name] Primary Care Cordai Rodrigue: [name] Other Clinician: Massie [name] Referring Johnathan Tortorelli: [name] Treating Ebonique Hallstrom/Extender: [name] in Treatment: 8 Vital Signs Time Taken: 10:48 Temperature (F): 98.9 Pulse (bpm): 66 Respiratory Rate (breaths/min): 18 Blood Pressure (mmHg): 103/67 Reference Range: 80 - 120 mg / dl Electronic Signature(s) Signed: 05/13/2022 4:34:39 PM By: [name] Entered By: [name] on 05/13/2022 10:52:18

## 2022-05-13 NOTE — Progress Notes (Addendum)
Heather, [name] ([id number]) Visit Report for 05/13/2022 Chief Complaint Document Details Patient Name: Heather, [name] Date of Service: 05/13/2022 10:30 AM Medical Record Number: [id number] Patient Account Number: [id number] Date of Birth/Sex: Apr 05, 1976 (46 y.o. F) Treating RN: Cornell Barman Primary Care Provider: Raelene Bott Other Clinician: Massie Kluver Referring Provider: Raelene Bott Treating Provider/Extender: Skipper Cliche in Treatment: 8 Information Obtained from: Patient Chief Complaint 03/19/2021; patient referred by Dr. Luana Shu who has been looking after her left foot for quite a period of time for review of a nonhealing area in the left midfoot 03/12/2022; bilateral feet wounds and right lower extremity wound. Electronic Signature(s) Signed: 05/13/2022 10:33:24 AM By: Worthy Keeler PA-C Entered By: Worthy Keeler on 05/13/2022 10:33:23 [name] ([id number]) -------------------------------------------------------------------------------- HPI Details Patient Name: Heather Dear A. Date of Service: 05/13/2022 10:30 AM Medical Record Number: [id number] Patient Account Number: [id number] Date of Birth/Sex: [birth year] (46 y.o. F) Treating RN: Cornell Barman Primary Care Provider: Raelene Bott Other Clinician: Massie Kluver Referring Provider: Raelene Bott Treating Provider/Extender: Skipper Cliche in Treatment: 8 History of Present Illness HPI Description: 01/18/18-She is here for initial evaluation of the left great toe ulcer. She is a poor historian in regards to timeframe in detail. She states approximately 4 weeks ago she lacerated her toe on something in the house. She followed up with her primary care who placed her on Bactrim and ultimately a second dose of Bactrim prior to coming to wound clinic. She states she has been treating the toe with peroxide, Betadine and a Band-Aid. She did not check her blood sugar this morning but checked it yesterday  morning it was 327; she is unaware of a recent A1c and there are no current records. She saw Dr. she would've orthopedics last week for an old injury to the left ankle, she states he did not see her toe, nor did she bring it to his attention. She smokes approximately 1 pack cigarettes a day. Her social situation is concerning, she arrives this morning with her mother who appears extremely intoxicated/under the influence; her mother was asked to leave the room and be monitored by the patient's grandmother. The patient's aunt then accompanied the patient and the room throughout the rest of the appointment. We had a lengthy discussion regarding the deleterious effects of uncontrolled hyperglycemia and smoking as it relates to wound healing and overall health. She was strongly encouraged to decrease her smoking and get her diabetes under better control. She states she is currently on a diet and has cut down her [facility] consumption. The left toe is erythematous, macerated and slightly edematous with malodor present. The edema in her left foot is below her baseline, there is no erythema streaking. We will treat her with Santyl, doxycycline; we have ordered and xray, culture and provided a Peg assist surgical shoe and cultured the wound. 01/25/18-She is here in follow-up evaluation for a left great toe ulcer and presents with an abscess to her suprapubic area. She states her blood sugars remain elevated, feeling "sick" and if levels are below 250, but she is trying. She has made no attempt to decrease her smoking stating that we "can't take away her food in her cigarettes". She has been compliant with offloading using the PEG assist you. She is using Santyl daily. the culture obtained last week grew staph aureus and Enterococcus faecalis; continues on the doxycycline and Augmentin was added on Monday. The suprapubic area has erythema, no femoral variation, purple discoloration,  minimal induration, was  accessed with a cotton tip applicator with sanguinopurulent drainage, this was cultured, I suspect the current antibiotic treatment will cover and we will not add anything to her current treatment plan. She was advised to go to urgent care or ER with any change in redness, induration or fever. 02/01/18-She is here in follow-up evaluation for left great toe ulcers and a new abdominal abscess from last week. She was able to use packing until earlier this week, where she "forgot it was there". She states she was feeling ill with GI symptoms last week and was not taking her antibiotic. She states her glucose levels have been predominantly less than 200, with occasional levels between 200-250. She thinks this was contributing to her GI symptoms as they have resolved without intervention. There continues to be significant laceration to left toe, otherwise it clinically looks stable/improved. There is now less superficial opening to the lateral aspect of the great toe that was residual blister. We will transition to [facility] to all wounds, she will continue her Augmentin. If there is no change or deterioration next week for reculture. 02/08/18-She is here in follow-up evaluation for left great toe ulcer and abdominal ulcer. There is an improvement in both wounds. She has been wrapping her left toe with coban, not by our direction, which has created an area of discoloration to the medial aspect; she has been advised to NOT use coban secondary to her neuropathy. She states her glucose levels have been high over this last week ranging from 200-350, she continues to smoke. She admits to being less compliant with her offloading shoe. We will continue with same treatment plan and she will follow-up next week. 02/15/18-She is here in follow-up evaluation for left great toe ulcer and abdominal ulcer. The abdominal ulcer is epithelialized. The left great toe ulcer is improved and all injury from last week using  the Coban wrap is resolved, the lateral ulcer is healed. She admits to noncompliance with wearing offloading shoe and admits to glucose levels being greater than 300 most of the week. She continues to smoke and expresses no desire to quit. There is one area medially that probes deeper than it has historically, erythema to the toe and dorsal foot has consistently waxed and waned. There is no overt signs of cellulitis or infection but we will culture the wound for any occult infection given the new area of depth and erythema. We will hold off on sensitivities for initiation of antibiotic therapy. 02/22/18-She is here in follow up evaluation for left great toe ulcer. There is overall significant improvement in both wound appearance, erythema and edema with changes made last week. She was not initiated on antibiotic therapy. Culture obtained last week showed oxacillin sensitive staph aureus, sensitive to clindamycin. Clindamycin has been called into the pharmacy but she has been instructed to hold off on initiation secondary to overall clinical improvement and her history of antibiotic intolerance. She has been instructed to contact the clinic with any noted changes/deterioration and the wound, erythema, edema and/or pain. She will follow-up next week. She continues to smoke and her glucose levels remain elevated >250; she admits to compliance with offloading shoe 03/01/18 on evaluation today patient appears to be doing fairly well in regard to her left first toe ulcer. She has been tolerating the dressing changes with the [facility] Dressing without complication and overall this has definitely showed signs of improvement according to records as well is what the patient tells me  today. I'm very pleased in that regard. She is having no pain today 03/08/18 She is here for follow up evaluation of a left great toe ulcer. She remains non-compliant with glucose control and smoking cessation; glucose levels  consistently >200. She states that she got new shoe inserts/peg assist. She admits to compliance with offloading. Since my last evaluation there is significant improvement. We will switch to prisma at this time and she will follow up next week. She is noted to be tachycardic at this appointment, heart rate 120s; she has a history of heart rate 70-130 according to our records. She admits to extreme agitation r/t personal issues; she was advised to monitor her heartrate and contact her physician if it does not return to a more normal range (<100). She takes cardizem twice daily. 03/15/18-She is here in follow-up evaluation for left great toe ulcer. She remains noncompliant with glucose control and smoking cessation. She admits to compliance with wearing offloading shoe. The ulcer is improved/stable and we will continue with the same treatment plan and she will follow-up next week 03/22/18-She is here for evaluation for left great toe ulcer. There continues to be significant improvement despite recurrent hyperglycemia (over 500 yesterday) and she continues to smoke. She has been compliant with offloading and we will continue with same treatment plan and she will follow-up next week. 03/29/18-She is here for evaluation for left great toe ulcer. Despite continuing to smoke and uncontrolled diabetes she continues to improve. She is compliant with offloading shoe. We will continue with the same treatment plan and she will follow-up next week 04/05/18- She is here in follow up evaluation for a left great toe ulcer; she presents with small pustule to left fifth toe (resembles ant bite). She admits to compliance with wearing offloading shoe; continues to smoke or have uncontrolled blood glucose control. There is more callus than usual with evidence of bleeding; she denies known trauma. 04/12/18-She is here for evaluation of left great toe ulcer. Despite noncompliance with glycemic control and smoking she continues  to make [place], [place] A. ([id number]) improvement. She continues to wear offloading shoe. The pustule, that was identified last week, to the left fifth toe is resolved. She will follow-up in 2 weeks 05/03/18-she is seen in follow-up evaluation for a left great toe ulcer. She is compliant with offloading, otherwise noncompliant with glycemic control and smoking. She has plateaued and there is minimal improvement noted. We will transition to [facility], replaced the insert to her surgical shoe and she will follow-up in one week 05/10/18- She is here in follow up evaluation for a left great toe ulcer. It appears stable despite measurement change. We will continue with same treatment plan and follow up next week. 05/24/18-She is seen in follow-up evaluation for a left great toe ulcer. She remains compliant with offloading, has made significant improvement in her diet, decreasing the amount of sugar/soda. She said her recent A1c was 10.9 which is lower than. She did see a diabetic nutritionist/educator yesterday. She continues to smoke. We will continue with the same treatment plan and she'll follow-up next week. 05/31/18- She is seen in follow-up evaluation for left great toe ulcer. She continues to remain compliant with offloading, continues to make improvement in her diet, increasing her water and decreasing the amount of sugar/soda. She does continue to smoke with no desire to quit. We will apply Prisma to the depth and Hydrofera Blue over. We have not received insurance authorization for oasis. She will follow up  next week. 06/07/18-She is seen in follow-up evaluation for left great toe ulcer. It has stalled according to today's measurements although base appears stable. She says she saw a diabetic educator yesterday; her average blood sugars are less than 300 which is an improvement for her. She continues to smoke and states "that's my next step" She continues with water over soda. We will order for  xray, culture and reinstate ace wrap compression prior to placing apligraf for next week. She is voicing no complaints or concerns. Her dressing will change to iodoflex over the next week in preparation for apligraf. 06/14/18-She is seen in follow-up evaluation for left great toe ulcer. Plain film x-ray performed last week was negative for osteomyelitis. Wound culture obtained last week grew strep B and OSSA; she is initiated on keflex and cefdinir today; there is erythema to the toe which could be from ace wrap compression, she has a history of wrapping too tight and has has been encouraged to maintain ace wraps that we place today. We will hold off on application of apligraf today, will apply next week after antibiotic therapy has been initiated. She admits today that she has resumed taking a shower with her foot/toe submerged in water, she has been reminded to keep foot/toe out of the bath water. She will be seen in follow up next week 06/21/18-she is seen in follow-up evaluation for left great toe ulcer. She is tolerating antibiotic therapy with no GI disturbance. The wound is stable. Apligraf was applied today. She has been decreasing her smoking, only had 4 cigarettes yesterday and 1 today. She continues being more compliant in diabetic diet. She will follow-up next week for evaluation of site, if stable will remove at 2 weeks. 06/28/18- She is here in follow up evalution. Apligraf was placed last week, she states the dressing fell off on Tuesday and she was dressing with hydrofera blue. She is healed and will be discharged from the clinic today. She has been instructed to continue with smoking cessation, continue monitoring glucose levels, offloading for an additional 4 weeks and continue with hydrofera blue for additional two weeks for any possible microscopic opening. Readmission: 08/07/18 on evaluation today patient presents for reevaluation concerning the ulcer of her right great toe. She was  previously discharged on 06/28/18 healed. Nonetheless she states that this began to show signs of drainage she subsequently went to her primary care provider. Subsequently an x-ray was performed on 08/01/18 which was negative. The patient was also placed on antibiotics at that time. Fortunately they should have been effective for the infection. Nonetheless she's been experiencing some improvement but still has a lot of drainage coming from the wound itself. 08/14/18 on evaluation today patient's wound actually does show signs of improvement in regard to the erythema at this point. She has completed the antibiotics. With that being said we did discuss the possibility of placing her in a total contact cast as of today although I think that I may want to give this just a little bit more time to ensure nothing recurrence as far as her infection is concerned. I do not want to put in the cast and risk infection at that time if things are not completely resolved. With that being said she is gonna require some debridement today. 08/21/18 on evaluation today patient actually appears to be doing okay in regard to her toe ulcer. She's been tolerating the dressing changes without complication. With that being said it does appear that she is ready  and in fact I think it's appropriate for [place] to go ahead and initiate the total contact cast today. Nonetheless she will require some sharp debridement to prepare the wound for application. Overall I feel like things have been progressing well but we do need to do something to get this to close more readily. 08/24/18 patient seen today for reevaluation after having had the total contact cast applied on Tuesday. She seems to have done very well the wound appears to be doing great and overall I'm pleased with the progress that she's made. There were no abnormal areas of rubbing from the cast on her lower extremity. 08/30/18 on evaluation today patient actually appears to be  completely healed in regard to her plantar toe ulcer. She tells me at this point she's been having a lot of issues with the cast. She almost fell a couple of times the state shall the step of her dog a couple times as well. This is been a very frustrating process for her other nonetheless she has completely healed the wound which is excellent news. Overall there does not appear to be the evidence of infection at this time which is great news. 09/11/18 evaluation today patient presents for follow-up concerning her great toe ulcer on the left which has unfortunately reopened since I last saw her which was only a couple of weeks ago. Unfortunately she was not able to get in to get the shoe and potentially the AFO that's gonna be necessary due to her left foot drop. She continues with offloading shoe but this is not enough to prevent her from reopening it appears. When we last had her in the total contact cast she did well from a healing standpoint but unfortunately the wound reopened as soon as she came out of the cast within just a couple of weeks. 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 This is also part of the reason why she falls on a regular basis. I just do not believe that is gonna be safe for [place] to reinitiate the total contact cast as last time we had this on she fell 3 times one week which is definitely not normal for her. 09/18/18 upon evaluation today the patient actually appears to be doing about the same in regard to her toe ulcer. She did not contact [facility] as I asked her to even though I had given her the prescription. In fact she actually states that she has no idea where the prescription is. She did apparently call [facility] and they told her that all she needed to do was bring the  prescription in order to be able to be seen and work on getting the AFO for her left foot. With all that being said she still does not have an appointment and I'm not sure were things stand that regard. I will give her a new prescription today in order to contact them to get this set up. 09/25/18 on evaluation today patient actually appears to be doing about the same in regard to her toes ulcer. She does have a small areas which seems to have a lot of callous buildup around the edge of the wound which is going to need sharp debridement today. She still is waiting to be scheduled for evaluation with  [facility] for possibility of an AFO. She states there supposed to call her tomorrow to get this set up. Unfortunately it does appear that her foot specifically the toe area is showing signs of erythema. There does not appear to be any systemic infection which is in these good news. QUIARA, KILLIAN A. ([id number]) 10/02/18 on evaluation today patient actually appears to be doing about the same in regard to her toe ulcer. This really has not done too well although it's not significantly larger it's also not significantly smaller. She has been tolerating the dressing changes without complication. She actually has her appointment with [facility] and [facility] tomorrow to hopefully be measured for obtaining and AFO splint. I think this would be helpful preventing this from reoccurring. We had contemplated starting the cast this week although to be honest I am reluctant to do that as she's been having nausea, vomiting, and seizure activity over the past three days. She has a history of seizures and have been told is nothing that can be done for these. With that being said I do believe that along with the seizures have the nausea vomiting which upon further questioning doesn't seem to be the normal for her and makes me concerned for the possibility of infection or something else going on. I discussed this with the patient  and her mother during the office visit today. I do not feel the wound is effective but maybe something else. The responses this was "this just happens to her at times and we don't know why". They did not seem to be interested in going to the hospital to have this checked out further. 10/09/18 on evaluation today patient presents for follow-up concerning her ongoing toe ulcer. She has been tolerating the dressing changes without complication. Fortunately there does not appear to be any evidence of infection which is great news however I do think that the patient would benefit from going ahead for with the total contact cast. She's actually in a wheelchair today she tells me that she will use her walker if we initiate the cast. I was very specific about the fact that if we were gonna do the cast I wanted to make sure that she was using the walker in order to prevent any falls. She tells me she does not have stairs that she has to traverse on a regular basis at her home. She has not had any seizures since last week again that something that happens to her often she tells me she did talk to [employer] and they said that it may take up to three weeks to get the brace approved for her. Hopefully that will not take that long but nonetheless in the meantime I do think the cast could be of benefit. 10/12/18 on evaluation today patient appears to be doing rather well in regard to her toe ulcer. It's just been a few days and already this is significantly improved both as far as overall appearance and size. Fortunately there's no sign of infection. She is here for her first obligatory cast change. 10/19/18 Seen today for follow up and management of left great toe ulcer. Wound continues to show improvement. Noted small open area with seroussang drainage with palpation. Denies any increased pain or recent fevers during visit. She will continue calcium alginate with offloading shoe. Denies any questions or concerns  during visit. 10/26/18 on evaluation today patient appears to be doing about the same as when I last saw her in regard to her wound bed.  Fortunately there does not appear to be any signs of infection. Unfortunately she continues to have a breakdown in regard to the toe region any time that she is not in the cast. It takes almost no time at all for this to happen. Nonetheless she still has not heard anything from the brace being made by [employer] as to when exactly this will be available to her. Fortunately there is no signs of infection at this time. 10/30/18 on evaluation today patient presents for application of the total contact cast as we just received him this morning. Fortunately we are gonna be able to apply this to her today which is great news. She continues to have no significant pain which is good news. Overall I do feel like things have been improving while she was the cast is when she doesn't have a cast that things get worse. She still has not really heard anything from [facility] regarding her brace. 11/02/18 upon evaluation today patient's wound already appears to be doing significantly better which is good news. Fortunately there does not appear to be any signs of infection also good news. Overall I do think the total contact cast as before is helping to heal this area unfortunately it's just not gonna likely keep the area closed and healed without her getting her brace at least. Again the foot drop is a significant issue for her. 11/09/18 on evaluation today patient appears to be doing excellent in regard to her toe ulcer which in fact is completely healed. Fortunately we finally got the situation squared away with the paperwork which was needed to proceed with getting her brace approved by Medicaid. I have filled that out unfortunately that information has been sent to the orthopedic office that I worked at 2 1/2 years ago and not tired Current wound care measures. Fortunately she seems to be  doing very well at this time. 11/23/18 on evaluation today patient appears to be doing More Poorly Compared to Last Time I Saw Her. At [facility] She Had Completely Healed. Currently she is continuing to have issues with reopening. She states that she just found out that the brace was approved through Medicaid now she just has to go get measured in order to have this fitted for her and then made. Subsequently she does not have an appointment for this yet that is going to complicate things we obviously cannot put her back in the cast if we do not have everything measured because they're not gonna be able to measure her foot while she is in the cast. 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 Obviously this is unfortunate and does have me somewhat worried as well. 11/30/18 on evaluation today patient's toe ulcer actually appears to be doing fairly well. The good news is she will be getting her brace in the shoes next week on Wednesday. Hopefully we will be able to get this to heal without having to go back in the cast however she may need the cast in order to get the wound completely heal and then go from there. Fortunately there's no signs of infection at this time. 12/07/18 on evaluation today patient fortunately did receive her brace and she states she could tell this definitely makes her walk better. With that being said she's been having issues with her toe where she noticed yesterday there was a lot of tissue that  was loosing off this appears to be much larger than what it was previous. She also states that her leg has been read putting much across the top of her foot just about the ankle although this seems to be receiving somewhat. The total area is still red and appears to be someone infected as best I can tell. She is previously taken Bactrim and that may be a good option for her today as well. We  are gonna see what I wound culture shows as well and I think that this is definitely appropriate. With that being said outside of the culture I still need to initiate something in the interim and that's what I'm gonna go ahead and select Bactrim is a good option for her. 12/14/18 on evaluation today patient appears to be doing better in regard to her left great toe ulcer as compared to last week's evaluation. There's still some erythema although this is significantly improved which is excellent news. Overall I do believe that she is making good progress is still gonna take some time before she is where I would like her to be from the standpoint of being able to place her back into the total contact cast. Hopefully we will be where we need to be by next week. 12/21/18 on evaluation today patient actually appears to be doing poorly in regard to her toe ulcer. She's been tolerating the dressing changes without complication. Fortunately there's no signs of systemic infection although she does have a lot of drainage from the toe ulcer and this does seem to be causing some issues at this point. She does have erythema on the distal portion of her toe that appears to be likely cellulitis. 12/28/18 on evaluation today patient actually appears to be doing a little better in my pinion in regard to her toe ulcer. With that being said she still does have some evidence of infection at this time and for her culture she had both E. coli as well as enterococcus as organisms noted on evaluation. For that reason I think that though the Keflex likely has treated the E. coli rather well this has really done nothing for the enterococcus. We are going to have to initiate treatment for this specifically. TELESIA, ATES A. ([id number]) 01/04/19 on evaluation today patient's toe actually appears to be doing better from the standpoint of infection. She currently would like to see about putting the cash back on I think that this is  appropriate as long as she takes care of it and keeps it from getting wet. She is gonna have some drainage we can definitely pass this up with Drawtex and alginate to try to prevent as much drainage as possible from causing the problems. With that being said I do want to at least try her with the cast between now and Tuesday. If there any issues we can't continue to use it then I will discontinue the use of the cast at that point. 01/08/19 on evaluation today patient actually appears to be doing very well as far as her foot ulcer specifically the great toe on the left is concerned. She did have an area of rubbing on the medial aspect of her left ankle which again is from the cast. Fortunately there's no signs of infection at this point in this appears to be a very slight skin breakdown. The patient tells me she felt it rubbing but didn't think it was that bad. Fortunately there is no signs of active infection at this time  which is good news. No fevers, chills, nausea, or vomiting noted at this time. 01/15/19 on evaluation today patient actually appears to be doing well in regard to her toe ulcer. Again as previous she seems to do well and she has the cast on which indicates to me that during the time she doesn't have a cast on she's putting way too much pressure on this region. Obviously I think that's gonna be an issue as with the current national emergency concerning the Covid-19 Virus it has been recommended that we discontinue the use of total contact casting by the chief medical officer of our company, Dr. Simona Huh. The reasoning is that if a patient becomes sick and cannot come into have the cast removed they could not just leave this on for an additional two weeks. Obviously the hospitals also do not want to receive patient's who are sick into the emergency department to potentially contaminate the region and spread the Covid-19 Virus among other sick individuals within the hospital system.  Therefore at this point we are suspending the use of total contact cast until the current emergency subsides. This was all discussed with the patient today as well. 01/22/19 on evaluation today patient's wound on her left great toe appears to be doing slightly worse than previously noted last week. She tells me that she has been on this quite a bit in fact she tells me she's been awake for 38 straight hours. This is due to the fact that she's having to care for grandparents because nobody else will. She has been taking care of them for five the last seven days since I've seen her they both have dementia his is from a stroke and her grandmother's was progressive. Nonetheless she states even her mom who knows her condition and situation has only help two of those days to take care of them she's been taking care of the rest. Fortunately there does not appear to be any signs of active infection in regard to her toe at this point although obviously it doesn't look as good as it did previous. I think this is directly related to her not taking off the pressure and friction by way of taking things easy. Though I completely understand what's going on. 01/29/19 on evaluation today patient's tools are actually appears to be showing some signs of improvement today compared to last week's evaluation as far as not necessarily the overall size of the wound but the fact that she has some new skin growth in between the two ends of the wound opening. Overall I feel like she has done well she states that she had a family member give her what sounds to be a CAM walker boot which has been helpful as well. 02/05/19 on evaluation today patient's wound bed actually appears to be doing significantly better in regard to her overall appearance of the size of the wound. With that being said she is still having an issue with offloading efficiently enough to get this to close. Apparently there is some signs of infection at this point  as well unfortunately. Previously she's done well of Augmentin I really do not see anything that needs to be culture currently but there theme and cellulitis of the foot that I'm seeing I'm gonna go ahead and place her on an antibiotic today to try to help clear this up. 02/12/2019 on evaluation today patient actually appears to be doing poorly in regard to her overall wound status. She tells me she has been  using her offloading shoe but actually comes in today wearing her tennis shoe with the AFO brace. Again as I previously discussed with her this is really not sufficient to allow the area to heal appropriately. Nonetheless she continues to be somewhat noncompliant and I do wonder based on what she has told my nurse in the past as to whether or not a good portion of this noncompliance may be recreational drug and alcohol related. She has had a history of heroin overdose and this was fairly recently in the past couple of months that have been seeing her. Nonetheless overall I feel like her wound looks significantly worse today compared to what it was previous. She still has significant erythema despite the Augmentin I am not sure that this is an appropriate medication for her infection I am also concerned that the infection may have gone down into her bone. 02/19/19 on evaluation today patient actually appears to be doing about the same in regard to her toe ulcer. Unfortunately she continues to show signs of bone exposure and infection at this point. There does not appear to be any evidence of worsening of the infection but I'm also not really sure that it's getting significantly better. She is on the Augmentin which should be sufficient for the Staphylococcus aureus infection that she has at this point. With that being said she may need IV antibiotics to more appropriately treat this. We did have a discussion today about hyperbaric option therapy. 02/28/19 on evaluation today patient actually appears to  be doing much worse in regard to the wound on her left great toe as compared to even my previous evaluation last week. Unfortunately this seems to be training in a pretty poor direction. Her toe was actually now starting to angle laterally and I can actually see the entire joint area of the proximal portion of the digit where is the distal portion of the digit again is no longer even in contact with the joint line. Unfortunately there's a lot more necrotic tissue around the edge and the toe appears to be showing signs of becoming gangrenous in my pinion. I'm very concerned about were things stand at this point. She did see infectious disease and they are planning to send in a prescription for Sivextro for her and apparently this has been approved. With that being said I don't think she should avoid taking this but at the same time I'm not sure that it's gonna be sufficient to save her toe at this point. 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 03/05/19 on evaluation today patient actually appears to be doing quite well in regard to her toe all things considering. She still has bone exposed but there appears to be much less your thing on overall the appearance of the wound and the toe itself is dramatically improved. She still does have some issues currently obviously with infection she did see vascular as well and there concerned that her blood flow to the toad. For that reason they are setting up for an angiogram next week. 03/14/19 on evaluation today patient appears to be doing very poor in regard to her toe and specifically in regard to the ulceration and the fact that she's starting to notice the toe was leaning even more towards  the lateral aspect and the complete joint is visible on the  proximal aspect of the joint. Nonetheless she's also noted a significant odor and the tip of the toe is turning more dark and necrotic appearing. Overall I think she is getting worse not better as far as this is concerned. For that reason I am recommending at this point that she likely needs to be seen for likely amputation. READMISSION 03/19/2021 This is a patient that we cared for in this clinic for a prolonged period of time in 2019 and 2020 with a left foot and left first toe wound. 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 ([id number]) 04/09/20 by Dr. Luana Shu. In December 2021 she had an ulcer on her right great toe as well as the fourth and fifth toes. 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 She also had an angiogram at that time and underwent angioplasty of the right anterior tibial artery. In any case she claims that the wound on the right foot is closed I did not look at this today which was probably an oversight although I think that should be done next week. After her surgery she developed a dehiscence but I do not see any follow-up of this. According to Dr. Deborra Medina last review that she was out of the area being cared for by another physician but recently came back to his attention. 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 A culture of this area showed E. coli apparently before she came back to see Dr. Luana Shu she was supposed to be receiving antibiotics but she did not really take them. Nor is she offloading this area at all. Finally her last hemoglobin A1c listed in epic was in March 2022 at 14.1 she says things are a lot better since then although I am not sure. 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 She had this wound at the  time. During this admission she had E. coli sepsis a TEE was negative. 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 She is still a heavy cigarette smoker. She has a history of bipolar disorder seizure disorder ABI in our clinic was 1.05 6/1; left midfoot in the setting of a TMA done previously. Round circular wound with a "knuckle" of protruding tissue. The problem is that the knuckle was not attached to any of the surrounding granulation and this probed proximally widely I removed a large portion of this tissue. This wound goes with considerable undermining laterally. I do not feel any bone there was no purulence but this is a deep wound. 6/8; in spite of the debridement I did last week. She arrives with a wound looking exactly the same. A protruding "knuckle" of tissue nonadherent to most of the surrounding tissue. There is considerable depth around this from 6-12 o'clock at 2.7 cm and undermining of 1 cm. This does not look overtly infected and the x-ray I did last week was negative for any osseous abnormalities. We have been using silver collagen 6/15; deep tissue culture I did last week showed moderate staph aureus and  moderate Pseudomonas. This will definitely require prolonged antibiotic therapy. The pathology on the protuberant area was negative for malignancy fungus etc. 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 We have been using silver collagen. I am going to be prescribing Levaquin for 2 weeks. Her CT scan of the foot is down for 7/5 6/22; CT scan of the foot on 7 5. She says she has hardware in the left leg from her previous fracture. She is on the Levaquin for the deep  tissue culture I did that showed methicillin sensitive staph aureus and Pseudomonas. I gave her a 2-week supply and she will have another week. She arrives in clinic today with the same protuberant tissue however this is nonadherent to the tissue surrounding it. I am really at a loss to explain this unless there is underlying deep tissue infection 6/29; patient presents for 1 week follow-up. She has been using collagen to the wound bed. She reports taking her antibiotics as prescribed.She has no complaints or issues today. She denies signs of infection. 7/6; patient presents for one week followup. She has been using collagen to the wound bed. She states she is taking Levaquin however at times she is not able to keep it down. She denies signs of infection. 7/13; patient presents for 1 week follow-up. She has been using silver alginate to the wound bed. She still has nausea when taking her antibiotics. She denies signs of infection. 7/20; patient presents for 1 week follow-up. She has been using silver alginate with gentamicin cream to the wound bed. She denies any issues and has no complaints today. She denies signs of infection. 7/27; patient presents for 1 week follow-up. She continues to use silver alginate with gentamicin cream to the wound bed. She reports starting her antibiotics. She has no issues or complaints. Overall she reports stability to the wound. 8/3; patient presents for 1 week follow-up. She has been using silver alginate with gentamicin cream to the wound bed. She reports completing all antibiotics. She has no issues or complaints today. She denies signs of infection. 8/17; patient presents for 2-week follow-up. He is to use silver alginate to the wound bed. She has no issues or complaints today. She denies signs of infection. She reports her pain has improved in her foot since last clinic visit 8/24; patient presents for 1 week follow-up. She continues to use silver alginate to  the wound bed. She has no issues or complaints. She denies signs of infection. Pain is stable. 9/7; patient presents for follow-up. She missed her last week appointment due to feeling ill. She continues to use silver alginate. She has a new wound to the right lower extremity that is covered in eschar. She states It occurred over the past week and has no idea how it started. She currently denies signs of infection. 9/14; patient presents for follow-up. To the left foot wound she has been using gentamicin cream and silver alginate. To the right lower extremity wound she has been keeping this covered and has not obtain Santyl. 9/21; patient presents for follow-up. She reports using gentamicin cream and silver alginate to the left foot and Santyl to the right lower extremity wound. She has no issues or complaints today. She denies signs of infection. 9/28; patient presents for follow-up. She reports a new wound to her right heel. She states this occurred a few days ago and is progressively gotten worse. She has been trying to clean the area  with a Q-tip and Santyl. She reports stability in the other 2 wounds. She has been using gentamicin cream and silver alginate to the left foot and Santyl to the right lower extremity wound. 10/12; patient presents for follow-up. She reports improvement to the wound beds. She is seeing vein and vascular to discuss the potential of a left BKA. She states they are going to do an arteriogram. She continues to use silver alginate with dressing changes to her wounds. 11/2; patient presents for follow-up. She states she has not been doing dressing changes to the wound beds. She states she is not able to offload the areas. She reports chronic pain to her left foot wound. 11/9; patient presents for follow-up. She came in with only socks on. She states she forgot to put on shoes. It is unclear if she is doing any dressing changes. She currently denies systemic signs of  infection. 11/16; patient presents for follow-up. She came again only with socks on. She states she does not wear shoes ever. It is unclear if she does dressing changes. She currently denies systemic signs of infection. 11/23; patient presents for follow-up. She wore her shoes today. It still unclear exactly what dressing she is using for each wound but she did states she obtained Dakin's solution and has been using this to the left foot wound. She currently denies signs of infection. 11/30; patient presents for follow-up. She has no issues or complaints today. She currently denies signs of infection. 12/7; patient presents for follow-up. She has no issues or complaints today. She has been using Hydrofera Blue to the right heel wound and Dakin solution to the left foot wound. Her right anterior leg wound is healed. She currently denies signs of infection. CHRYSTA, FULCHER A. ([id number]) 12/14; patient presents for follow-up. She has been using Hydrofera Blue to the right heel and Dakin's to the left foot wounds. She has no issues or complaints today. She denies signs of infection. 12/21; patient presents for follow-up. She reports using Hydrofera Blue to the right heel and Dakin's to the left foot wound. She denies signs of infection. 12/28; patient presents for follow-up. She continues to use Dakin's to the left foot wound and Hydrofera Blue to the right heel wound. She denies signs of infection. 1/4; patient presents for follow-up. She has no issues or complaints today. She denies signs of infection. 1/11; patient presents for follow-up. It is unclear if she has been dressing these wounds over the past week. She currently denies signs of infection. 1/18; patient presents for follow-up. She states she has been using Dakin's wet-to-dry dressings to the left foot. She has been using Hydrofera Blue to the right foot foot wound. She states that the anterior right leg wound has reopened and draining  serous fluid. She denies signs of infection. 1/25; patient presents for follow-up. She has no issues or complaints today. 2/1; patient presents for follow-up. She has no issues or complaints today. She denies signs of infection. 2/8; patient presents for follow-up. She has lost her surgical shoes. She did not have a dressing to the right heel wound. She currently denies signs of infection. 2/15; patient presents for follow-up. She reports more pain to the right heel today. She denies purulent drainage Or fever/chills 2/22; patient presents for follow-up. She reports taking clindamycin over the past week. She states that she continues to have pain to her right heel. She reports purulent drainage. Readmission 03/16/2022 Ms. Jacquese [name] is a 46 year old female with a  past medical history of type 2 diabetes, osteomyelitis to her feet, chronic systolic heart failure and bipolar disorder that presents to the clinic for bilateral feet wounds and right lower extremity wound. She was last seen in our clinic on 12/15/2021. At that time she had purulent drainage coming out of her right plantar foot and I recommended she go to the ED. She states she went to [facility] and has been there for the past 3 months. I cannot see the records. She states she had OR debridement and was on several weeks of IV antibiotics while inpatient. Since discharge she has not been taking care of the wound beds. She had nothing on her feet other than socks today. She currently denies signs of infection. 5/31; patient presents for follow-up. She has been using Dakin's wet-to-dry dressings to the wound beds on her feet bilaterally and antibiotic ointment to the right anterior leg wound. She had a wound culture done at last clinic visit that showed moderate Pseudomonas aeruginosa sensitive to ciprofloxacin. She currently denies systemic signs of infection. 6/14; patient presents for follow-up. She received Keystone 5 days  ago and has been using this on the wound beds. She states that last week she had to go to the hospital because she had increased warmth and erythema to the right foot. She was started on 2 oral antibiotics. She states she has been taking these. She currently denies systemic signs of infection. She has no issues or complaints today. 6/21; patient presents for follow-up. She states she has been using Keystone antibiotics to the wound beds. She has no issues or complaints today. She denies signs of infection. 6/28; patient presents for follow-up. She has been using Keystone antibiotics to the wound beds. She has no issues or complaints today. 7/12; patient presents for follow-up. Has been using Keystone antibiotics to the wound beds with calcium alginate. She has no issues or complaints today. She never followed up with her orthopedic surgeon who did the OR debridement to the right foot. We discussed the total contact cast for the left foot and patient would like to do this next week. 7/19; patient presents for follow-up. She has been using Keystone antibiotics with calcium alginate to the wound beds. She has no issues or complaints today. Patient is in agreement to do the total contact cast of the left foot today. She knows to return later this week for the obligatory cast change. 05-13-2022 upon evaluation today patient's wound which she has the cast of the left leg actually appears to be doing significantly better. Fortunately I do not see any signs of active infection locally or systemically which is great news and overall I am extremely pleased with where we stand currently. Electronic Signature(s) Signed: 05/13/2022 11:27:29 AM By: Worthy Keeler PA-C Entered By: Worthy Keeler on 05/13/2022 11:27:29 [name] ([id number]) -------------------------------------------------------------------------------- Physical Exam Details Patient Name: Heather Dear A. Date of Service: 05/13/2022  10:30 AM Medical Record Number: [id number] Patient Account Number: [id number] Date of Birth/Sex: 01-01-1976 (46 y.o. F) Treating RN: Cornell Barman Primary Care Provider: Raelene Bott Other Clinician: Massie Kluver Referring Provider: Raelene Bott Treating Provider/Extender: Skipper Cliche in Treatment: 8 Constitutional Well-nourished and well-hydrated in no acute distress. Respiratory normal breathing without difficulty. Psychiatric this patient is able to make decisions and demonstrates good insight into disease process. Alert and Oriented x 3. pleasant and cooperative. Notes Patient's wound is showing signs of excellent epithelization I think the total contact cast is definitely  doing a great job here. Electronic Signature(s) Signed: 05/13/2022 11:27:47 AM By: Worthy Keeler PA-C Entered By: Worthy Keeler on 05/13/2022 11:27:46 [name] ([id number]) -------------------------------------------------------------------------------- Physician Orders Details Patient Name: Heather Dear A. Date of Service: 05/13/2022 10:30 AM Medical Record Number: [id number] Patient Account Number: [id number] Date of Birth/Sex: Jan 17, 1976 (46 y.o. F) Treating RN: Cornell Barman Primary Care Provider: Raelene Bott Other Clinician: Massie Kluver Referring Provider: Raelene Bott Treating Provider/Extender: Skipper Cliche in Treatment: 8 Verbal / Phone Orders: No Diagnosis Coding ICD-10 Coding Code Description [phone] 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ity 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.621 Type 2 diabetes mellitus with foot ulcer E11.42 Type 2 diabetes mellitus with diabetic polyneuropathy L97.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 of skin Follow-up Appointments o Return Appointment in 1 week. o Nurse Visit as needed [employer] o Wash wounds with antibacterial soap and water. o No tub bath. Anesthetic (Use 'Patient Medications' Section for Anesthetic Order Entry) o Lidocaine applied to wound bed Edema Control - Lymphedema / Segmental Compressive Device / Other o Elevate, Exercise Daily and Avoid Standing for Long Periods of Time. o Elevate legs to the level of the heart and pump ankles as often as possible o Elevate leg(s) parallel to the floor when sitting. Off-Loading o Total Contact Cast to Left Lower Extremity - contact cast #2 applied Don't get cast wet o Other: - keep pressure off of feet. Medications-Please add to medication list. o Keystone Compound - Keystone gel Wound Treatment Wound #11 - Foot Wound Laterality: Plantar, Right Cleanser: Soap and Water 1 x Per Day/30 Days Discharge Instructions: Gently cleanse wound with antibacterial soap, rinse and pat dry prior to dressing wounds Topical: keystone gel 1 x Per Day/30 Days Primary Dressing: Aquacel Extra Hydrofiber Dressing, 4x5 (in/in) (Generic) 1 x Per Day/30 Days Discharge Instructions: packed into wound Secondary Dressing: ABD Pad 5x9 (in/in) (Generic) 1 x Per Day/30 Days Discharge Instructions: Cover with ABD pad Secured With: Medipore Tape - 37M Medipore H Soft Cloth Surgical Tape, 2x2 (in/yd) (Generic) 1 x Per Day/30 Days Secured With: Kerlix Roll Sterile or Non-Sterile 6-ply 4.5x4 (yd/yd) (Generic) 1 x Per Day/30 Days Discharge Instructions: Apply Kerlix as directed Wound #12 - Foot Wound Laterality: Plantar, Left, Medial Cleanser: Soap and Water 1 x Per Day/30 Days Discharge Instructions: Gently cleanse wound with antibacterial soap, rinse and pat dry prior to dressing wounds [place], Torra A. ([id number]) Topical: keystone gel 1 x Per Day/30 Days Discharge Instructions: apply very thin layer to wound Primary Dressing: Aquacel Extra Hydrofiber Dressing, 4x5 (in/in) (Generic) 1 x Per Day/30 Days Discharge  Instructions: placed over keystone gel Secondary Dressing: ABD Pad 5x9 (in/in) (Generic) 1 x Per Day/30 Days Discharge Instructions: Cover with ABD pad Secured With: Medipore Tape - 37M Medipore H Soft Cloth Surgical Tape, 2x2 (in/yd) (Generic) 1 x Per Day/30 Days Secured With: Kerlix Roll Sterile or Non-Sterile 6-ply 4.5x4 (yd/yd) (Generic) 1 x Per Day/30 Days Discharge Instructions: Apply Kerlix as directed Electronic Signature(s) Signed: 05/13/2022 4:22:23 PM By: Worthy Keeler PA-C Signed: 05/13/2022 4:34:39 PM By: Massie Kluver Entered By: Massie Kluver on 05/13/2022 11:18:17 [name], [name] ([id number]) -------------------------------------------------------------------------------- Problem List Details Patient Name: Heather Dear A. Date of Service: 05/13/2022 10:30 AM Medical Record Number: [id number] Patient Account Number: [id number] Date of Birth/Sex: February 19, 1976 (46 y.o. F) Treating RN:  Cornell Barman Primary Care Provider: Raelene Bott Other Clinician: Massie Kluver Referring Provider: Raelene Bott Treating Provider/Extender: Skipper Cliche in Treatment: 8 Active Problems ICD-10 Encounter Code Description 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/2022 No Yes severity L97.512 Non-pressure chronic ulcer of other part of right foot with fat layer 03/16/2022 No Yes exposed E11.621 Type 2 diabetes mellitus with foot ulcer 03/16/2022 No Yes E11.42 Type 2 diabetes mellitus with diabetic polyneuropathy 03/16/2022 No Yes L97.811 Non-pressure chronic ulcer of other part of right lower leg limited to 03/16/2022 No Yes breakdown of skin Inactive Problems Resolved Problems Electronic Signature(s) Signed: 05/13/2022 10:33:01 AM By: Worthy Keeler PA-C Entered By: Worthy Keeler on 05/13/2022 10:33:01 Coard, [name]  ([id number]) -------------------------------------------------------------------------------- Progress Note Details Patient Name: Heather Dear A. Date of Service: 05/13/2022 10:30 AM Medical Record Number: [id number] Patient Account Number: [id number] Date of Birth/Sex: Jan 02, 1976 (46 y.o. F) Treating RN: Cornell Barman Primary Care Provider: Raelene Bott Other Clinician: Massie Kluver Referring Provider: Raelene Bott Treating Provider/Extender: Skipper Cliche in Treatment: 8 Subjective Chief Complaint Information obtained from Patient 03/19/2021; patient referred by Dr. Luana Shu who has been looking after her left foot for quite a period of time for review of a nonhealing area in the left midfoot 03/12/2022; bilateral feet wounds and right lower extremity wound. History of Present Illness (HPI) 01/18/18-She is here for initial evaluation of the left great toe ulcer. She is a poor historian in regards to timeframe in detail. She states approximately 4 weeks ago she lacerated her toe on something in the house. She followed up with her primary care who placed her on Bactrim and ultimately a second dose of Bactrim prior to coming to wound clinic. She states she has been treating the toe with peroxide, Betadine and a Band-Aid. She did not check her blood sugar this morning but checked it yesterday morning it was 327; she is unaware of a recent A1c and there are no current records. She saw Dr. she would've orthopedics last week for an old injury to the left ankle, she states he did not see her toe, nor did she bring it to his attention. She smokes approximately 1 pack cigarettes a day. Her social situation is concerning, she arrives this morning with her mother who appears extremely intoxicated/under the influence; her mother was asked to leave the room and be monitored by the patient's grandmother. The patient's aunt then accompanied the patient and the room throughout the rest of the appointment.  We had a lengthy discussion regarding the deleterious effects of uncontrolled hyperglycemia and smoking as it relates to wound healing and overall health. She was strongly encouraged to decrease her smoking and get her diabetes under better control. She states she is currently on a diet and has cut down her [facility] consumption. The left toe is erythematous, macerated and slightly edematous with malodor present. The edema in her left foot is below her baseline, there is no erythema streaking. We will treat her with Santyl, doxycycline; we have ordered and xray, culture and provided a Peg assist surgical shoe and cultured the wound. 01/25/18-She is here in follow-up evaluation for a left great toe ulcer and presents with an abscess to her suprapubic area. She states her blood sugars remain elevated, feeling "sick" and if levels are below 250, but she is trying. She has made no attempt to decrease her smoking stating that we "can't take away her  food in her cigarettes". She has been compliant with offloading using the PEG assist you. She is using Santyl daily. the culture obtained last week grew staph aureus and Enterococcus faecalis; continues on the doxycycline and Augmentin was added on Monday. The suprapubic area has erythema, no femoral variation, purple discoloration, minimal induration, was accessed with a cotton tip applicator with sanguinopurulent drainage, this was cultured, I suspect the current antibiotic treatment will cover and we will not add anything to her current treatment plan. She was advised to go to urgent care or ER with any change in redness, induration or fever. 02/01/18-She is here in follow-up evaluation for left great toe ulcers and a new abdominal abscess from last week. She was able to use packing until earlier this week, where she "forgot it was there". She states she was feeling ill with GI symptoms last week and was not taking her antibiotic. She states her glucose  levels have been predominantly less than 200, with occasional levels between 200-250. She thinks this was contributing to her GI symptoms as they have resolved without intervention. There continues to be significant laceration to left toe, otherwise it clinically looks stable/improved. There is now less superficial opening to the lateral aspect of the great toe that was residual blister. We will transition to [facility] to all wounds, she will continue her Augmentin. If there is no change or deterioration next week for reculture. 02/08/18-She is here in follow-up evaluation for left great toe ulcer and abdominal ulcer. There is an improvement in both wounds. She has been wrapping her left toe with coban, not by our direction, which has created an area of discoloration to the medial aspect; she has been advised to NOT use coban secondary to her neuropathy. She states her glucose levels have been high over this last week ranging from 200-350, she continues to smoke. She admits to being less compliant with her offloading shoe. We will continue with same treatment plan and she will follow-up next week. 02/15/18-She is here in follow-up evaluation for left great toe ulcer and abdominal ulcer. The abdominal ulcer is epithelialized. The left great toe ulcer is improved and all injury from last week using the Coban wrap is resolved, the lateral ulcer is healed. She admits to noncompliance with wearing offloading shoe and admits to glucose levels being greater than 300 most of the week. She continues to smoke and expresses no desire to quit. There is one area medially that probes deeper than it has historically, erythema to the toe and dorsal foot has consistently waxed and waned. There is no overt signs of cellulitis or infection but we will culture the wound for any occult infection given the new area of depth and erythema. We will hold off on sensitivities for initiation of antibiotic therapy. 02/22/18-She  is here in follow up evaluation for left great toe ulcer. There is overall significant improvement in both wound appearance, erythema and edema with changes made last week. She was not initiated on antibiotic therapy. Culture obtained last week showed oxacillin sensitive staph aureus, sensitive to clindamycin. Clindamycin has been called into the pharmacy but she has been instructed to hold off on initiation secondary to overall clinical improvement and her history of antibiotic intolerance. She has been instructed to contact the clinic with any noted changes/deterioration and the wound, erythema, edema and/or pain. She will follow-up next week. She continues to smoke and her glucose levels remain elevated >250; she admits to compliance with offloading shoe 03/01/18 on  evaluation today patient appears to be doing fairly well in regard to her left first toe ulcer. She has been tolerating the dressing changes with the [facility] Dressing without complication and overall this has definitely showed signs of improvement according to records as well is what the patient tells me today. I'm very pleased in that regard. She is having no pain today 03/08/18 She is here for follow up evaluation of a left great toe ulcer. She remains non-compliant with glucose control and smoking cessation; glucose levels consistently >200. She states that she got new shoe inserts/peg assist. She admits to compliance with offloading. Since my last evaluation there is significant improvement. We will switch to prisma at this time and she will follow up next week. She is noted to be tachycardic at this appointment, heart rate 120s; she has a history of heart rate 70-130 according to our records. She admits to extreme agitation r/t personal issues; she was advised to monitor her heartrate and contact her physician if it does not return to a more normal range (<100). She takes cardizem twice daily. 03/15/18-She is here in follow-up  evaluation for left great toe ulcer. She remains noncompliant with glucose control and smoking cessation. She admits to compliance with wearing offloading shoe. The ulcer is improved/stable and we will continue with the same treatment plan and she will follow-up next week 03/22/18-She is here for evaluation for left great toe ulcer. There continues to be significant improvement despite recurrent hyperglycemia (over 500 yesterday) and she continues to smoke. She has been compliant with offloading and we will continue with same treatment plan and she will follow-up next week. Thurmon, [name] ([id number]) 03/29/18-She is here for evaluation for left great toe ulcer. Despite continuing to smoke and uncontrolled diabetes she continues to improve. She is compliant with offloading shoe. We will continue with the same treatment plan and she will follow-up next week 04/05/18- She is here in follow up evaluation for a left great toe ulcer; she presents with small pustule to left fifth toe (resembles ant bite). She admits to compliance with wearing offloading shoe; continues to smoke or have uncontrolled blood glucose control. There is more callus than usual with evidence of bleeding; she denies known trauma. 04/12/18-She is here for evaluation of left great toe ulcer. Despite noncompliance with glycemic control and smoking she continues to make improvement. She continues to wear offloading shoe. The pustule, that was identified last week, to the left fifth toe is resolved. She will follow-up in 2 weeks 05/03/18-she is seen in follow-up evaluation for a left great toe ulcer. She is compliant with offloading, otherwise noncompliant with glycemic control and smoking. She has plateaued and there is minimal improvement noted. We will transition to [facility], replaced the insert to her surgical shoe and she will follow-up in one week 05/10/18- She is here in follow up evaluation for a left great toe ulcer. It  appears stable despite measurement change. We will continue with same treatment plan and follow up next week. 05/24/18-She is seen in follow-up evaluation for a left great toe ulcer. She remains compliant with offloading, has made significant improvement in her diet, decreasing the amount of sugar/soda. She said her recent A1c was 10.9 which is lower than. She did see a diabetic nutritionist/educator yesterday. She continues to smoke. We will continue with the same treatment plan and she'll follow-up next week. 05/31/18- She is seen in follow-up evaluation for left great toe ulcer. She continues to remain compliant  with offloading, continues to make improvement in her diet, increasing her water and decreasing the amount of sugar/soda. She does continue to smoke with no desire to quit. We will apply Prisma to the depth and Hydrofera Blue over. We have not received insurance authorization for oasis. She will follow up next week. 06/07/18-She is seen in follow-up evaluation for left great toe ulcer. It has stalled according to today's measurements although base appears stable. She says she saw a diabetic educator yesterday; her average blood sugars are less than 300 which is an improvement for her. She continues to smoke and states "that's my next step" She continues with water over soda. We will order for xray, culture and reinstate ace wrap compression prior to placing apligraf for next week. She is voicing no complaints or concerns. Her dressing will change to iodoflex over the next week in preparation for apligraf. 06/14/18-She is seen in follow-up evaluation for left great toe ulcer. Plain film x-ray performed last week was negative for osteomyelitis. Wound culture obtained last week grew strep B and OSSA; she is initiated on keflex and cefdinir today; there is erythema to the toe which could be from ace wrap compression, she has a history of wrapping too tight and has has been encouraged to maintain ace  wraps that we place today. We will hold off on application of apligraf today, will apply next week after antibiotic therapy has been initiated. She admits today that she has resumed taking a shower with her foot/toe submerged in water, she has been reminded to keep foot/toe out of the bath water. She will be seen in follow up next week 06/21/18-she is seen in follow-up evaluation for left great toe ulcer. She is tolerating antibiotic therapy with no GI disturbance. The wound is stable. Apligraf was applied today. She has been decreasing her smoking, only had 4 cigarettes yesterday and 1 today. She continues being more compliant in diabetic diet. She will follow-up next week for evaluation of site, if stable will remove at 2 weeks. 06/28/18- She is here in follow up evalution. Apligraf was placed last week, she states the dressing fell off on Tuesday and she was dressing with hydrofera blue. She is healed and will be discharged from the clinic today. She has been instructed to continue with smoking cessation, continue monitoring glucose levels, offloading for an additional 4 weeks and continue with hydrofera blue for additional two weeks for any possible microscopic opening. Readmission: 08/07/18 on evaluation today patient presents for reevaluation concerning the ulcer of her right great toe. She was previously discharged on 06/28/18 healed. Nonetheless she states that this began to show signs of drainage she subsequently went to her primary care provider. Subsequently an x-ray was performed on 08/01/18 which was negative. The patient was also placed on antibiotics at that time. Fortunately they should have been effective for the infection. Nonetheless she's been experiencing some improvement but still has a lot of drainage coming from the wound itself. 08/14/18 on evaluation today patient's wound actually does show signs of improvement in regard to the erythema at this point. She has completed the  antibiotics. With that being said we did discuss the possibility of placing her in a total contact cast as of today although I think that I may want to give this just a little bit more time to ensure nothing recurrence as far as her infection is concerned. I do not want to put in the cast and risk infection at that time if things  are not completely resolved. With that being said she is gonna require some debridement today. 08/21/18 on evaluation today patient actually appears to be doing okay in regard to her toe ulcer. She's been tolerating the dressing changes without complication. With that being said it does appear that she is ready and in fact I think it's appropriate for [place] to go ahead and initiate the total contact cast today. Nonetheless she will require some sharp debridement to prepare the wound for application. Overall I feel like things have been progressing well but we do need to do something to get this to close more readily. 08/24/18 patient seen today for reevaluation after having had the total contact cast applied on Tuesday. She seems to have done very well the wound appears to be doing great and overall I'm pleased with the progress that she's made. There were no abnormal areas of rubbing from the cast on her lower extremity. 08/30/18 on evaluation today patient actually appears to be completely healed in regard to her plantar toe ulcer. She tells me at this point she's been having a lot of issues with the cast. She almost fell a couple of times the state shall the step of her dog a couple times as well. This is been a very frustrating process for her other nonetheless she has completely healed the wound which is excellent news. Overall there does not appear to be the evidence of infection at this time which is great news. 09/11/18 evaluation today patient presents for follow-up concerning her great toe ulcer on the left which has unfortunately reopened since I last saw her which was  only a couple of weeks ago. Unfortunately she was not able to get in to get the shoe and potentially the AFO that's gonna be necessary due to her left foot drop. She continues with offloading shoe but this is not enough to prevent her from reopening it appears. When we last had her in the total contact cast she did well from a healing standpoint but unfortunately the wound reopened as soon as she came out of the cast within just a couple of weeks. 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 This is also part of the reason why she falls on a regular basis. I just do not believe that is gonna be safe for [place] to reinitiate the total contact cast as last time we had this on she fell 3 times one week which is definitely not normal for her. 09/18/18 upon evaluation today the patient actually appears to be doing about the same in regard to her toe ulcer. She did not contact [employer] as I asked her to even though I had given her the prescription. In fact she actually states that she has no idea where the prescription is. She did apparently call [employer] and they told her that all she needed to do was bring the prescription in order to be able to be seen and work on getting the AFO for her left foot. With all that being said she still does not have an appointment and I'm not sure were things stand that regard. I will give her a new prescription today in order to contact them to get this set up. TONIA, AVINO ([id number]) 09/25/18 on evaluation today  patient actually appears to be doing about the same in regard to her toes ulcer. She does have a small areas which seems to have a lot of callous buildup around the edge of the wound which is going to need sharp debridement today. She still is waiting to be scheduled for  evaluation with [employer] for possibility of an AFO. She states there supposed to call her tomorrow to get this set up. Unfortunately it does appear that her foot specifically the toe area is showing signs of erythema. There does not appear to be any systemic infection which is in these good news. 10/02/18 on evaluation today patient actually appears to be doing about the same in regard to her toe ulcer. This really has not done too well although it's not significantly larger it's also not significantly smaller. She has been tolerating the dressing changes without complication. She actually has her appointment with [facility] [place] tomorrow to hopefully be measured for obtaining and AFO splint. I think this would be helpful preventing this from reoccurring. We had contemplated starting the cast this week although to be honest I am reluctant to do that as she's been having nausea, vomiting, and seizure activity over the past three days. She has a history of seizures and have been told is nothing that can be done for these. With that being said I do believe that along with the seizures have the nausea vomiting which upon further questioning doesn't seem to be the normal for her and makes me concerned for the possibility of infection or something else going on. I discussed this with the patient and her mother during the office visit today. I do not feel the wound is effective but maybe something else. The responses this was "this just happens to her at times and we don't know why". They did not seem to be interested in going to the hospital to have this checked out further. 10/09/18 on evaluation today patient presents for follow-up concerning her ongoing toe ulcer. She has been tolerating the dressing changes without complication. Fortunately there does not appear to be any evidence of infection which is great news however I do think that the patient would benefit from going ahead for with the total  contact cast. She's actually in a wheelchair today she tells me that she will use her walker if we initiate the cast. I was very specific about the fact that if we were gonna do the cast I wanted to make sure that she was using the walker in order to prevent any falls. She tells me she does not have stairs that she has to traverse on a regular basis at her home. She has not had any seizures since last week again that something that happens to her often she tells me she did talk to [employer] and they said that it may take up to three weeks to get the brace approved for her. Hopefully that will not take that long but nonetheless in the meantime I do think the cast could be of benefit. 10/12/18 on evaluation today patient appears to be doing rather well in regard to her toe ulcer. It's just been a few days and already this is significantly improved both as far as overall appearance and size. Fortunately there's no sign of infection. She is here for her first obligatory cast change. 10/19/18 Seen today for follow up and management of left great toe ulcer. Wound continues to show improvement. Noted small open area with seroussang drainage  with palpation. Denies any increased pain or recent fevers during visit. She will continue calcium alginate with offloading shoe. Denies any questions or concerns during visit. 10/26/18 on evaluation today patient appears to be doing about the same as when I last saw her in regard to her wound bed. Fortunately there does not appear to be any signs of infection. Unfortunately she continues to have a breakdown in regard to the toe region any time that she is not in the cast. It takes almost no time at all for this to happen. Nonetheless she still has not heard anything from the brace being made by [employer] as to when exactly this will be available to her. Fortunately there is no signs of infection at this time. 10/30/18 on evaluation today patient presents for application of the  total contact cast as we just received him this morning. Fortunately we are gonna be able to apply this to her today which is great news. She continues to have no significant pain which is good news. Overall I do feel like things have been improving while she was the cast is when she doesn't have a cast that things get worse. She still has not really heard anything from [facility] regarding her brace. 11/02/18 upon evaluation today patient's wound already appears to be doing significantly better which is good news. Fortunately there does not appear to be any signs of infection also good news. Overall I do think the total contact cast as before is helping to heal this area unfortunately it's just not gonna likely keep the area closed and healed without her getting her brace at least. Again the foot drop is a significant issue for her. 11/09/18 on evaluation today patient appears to be doing excellent in regard to her toe ulcer which in fact is completely healed. Fortunately we finally got the situation squared away with the paperwork which was needed to proceed with getting her brace approved by Medicaid. I have filled that out unfortunately that information has been sent to the orthopedic office that I worked at 2 1/2 years ago and not tired Current wound care measures. Fortunately she seems to be doing very well at this time. 11/23/18 on evaluation today patient appears to be doing More Poorly Compared to Last Time I Saw Her. At [facility] She Had Completely Healed. Currently she is continuing to have issues with reopening. She states that she just found out that the brace was approved through Medicaid now she just has to go get measured in order to have this fitted for her and then made. Subsequently she does not have an appointment for this yet that is going to complicate things we obviously cannot put her back in the cast if we do not have everything measured because they're not gonna be able to measure  her foot while she is in the cast. 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 Obviously this is unfortunate and does have me somewhat worried as well. 11/30/18 on evaluation today patient's toe ulcer actually appears to be doing fairly well. The good news is she will be getting her brace in the shoes next week on Wednesday. Hopefully we will be able to get this to heal without having to go back in the cast however she may need the cast in order to get the wound completely heal and then go from there. Fortunately there's  no signs of infection at this time. 12/07/18 on evaluation today patient fortunately did receive her brace and she states she could tell this definitely makes her walk better. With that being said she's been having issues with her toe where she noticed yesterday there was a lot of tissue that was loosing off this appears to be much larger than what it was previous. She also states that her leg has been read putting much across the top of her foot just about the ankle although this seems to be receiving somewhat. The total area is still red and appears to be someone infected as best I can tell. She is previously taken Bactrim and that may be a good option for her today as well. We are gonna see what I wound culture shows as well and I think that this is definitely appropriate. With that being said outside of the culture I still need to initiate something in the interim and that's what I'm gonna go ahead and select Bactrim is a good option for her. 12/14/18 on evaluation today patient appears to be doing better in regard to her left great toe ulcer as compared to last week's evaluation. There's still some erythema although this is significantly improved which is excellent news. Overall I do believe that she is making good progress is still gonna take some time before she is where I would like  her to be from the standpoint of being able to place her back into the total contact cast. Hopefully we will be where we need to be by next week. 12/21/18 on evaluation today patient actually appears to be doing poorly in regard to her toe ulcer. She's been tolerating the dressing changes without complication. Fortunately there's no signs of systemic infection although she does have a lot of drainage from the toe ulcer and this does Mcjunkin, Malaysha A. ([id number]) seem to be causing some issues at this point. She does have erythema on the distal portion of her toe that appears to be likely cellulitis. 12/28/18 on evaluation today patient actually appears to be doing a little better in my pinion in regard to her toe ulcer. With that being said she still does have some evidence of infection at this time and for her culture she had both E. coli as well as enterococcus as organisms noted on evaluation. For that reason I think that though the Keflex likely has treated the E. coli rather well this has really done nothing for the enterococcus. We are going to have to initiate treatment for this specifically. 01/04/19 on evaluation today patient's toe actually appears to be doing better from the standpoint of infection. She currently would like to see about putting the cash back on I think that this is appropriate as long as she takes care of it and keeps it from getting wet. She is gonna have some drainage we can definitely pass this up with Drawtex and alginate to try to prevent as much drainage as possible from causing the problems. With that being said I do want to at least try her with the cast between now and Tuesday. If there any issues we can't continue to use it then I will discontinue the use of the cast at that point. 01/08/19 on evaluation today patient actually appears to be doing very well as far as her foot ulcer specifically the great toe on the left is concerned. She did have an area of rubbing on  the medial aspect of her left ankle  which again is from the cast. Fortunately there's no signs of infection at this point in this appears to be a very slight skin breakdown. The patient tells me she felt it rubbing but didn't think it was that bad. Fortunately there is no signs of active infection at this time which is good news. No fevers, chills, nausea, or vomiting noted at this time. 01/15/19 on evaluation today patient actually appears to be doing well in regard to her toe ulcer. Again as previous she seems to do well and she has the cast on which indicates to me that during the time she doesn't have a cast on she's putting way too much pressure on this region. Obviously I think that's gonna be an issue as with the current national emergency concerning the Covid-19 Virus it has been recommended that we discontinue the use of total contact casting by the chief medical officer of our company, Dr. Simona Huh. The reasoning is that if a patient becomes sick and cannot come into have the cast removed they could not just leave this on for an additional two weeks. Obviously the hospitals also do not want to receive patient's who are sick into the emergency department to potentially contaminate the region and spread the Covid-19 Virus among other sick individuals within the hospital system. Therefore at this point we are suspending the use of total contact cast until the current emergency subsides. This was all discussed with the patient today as well. 01/22/19 on evaluation today patient's wound on her left great toe appears to be doing slightly worse than previously noted last week. She tells me that she has been on this quite a bit in fact she tells me she's been awake for 38 straight hours. This is due to the fact that she's having to care for grandparents because nobody else will. She has been taking care of them for five the last seven days since I've seen her they both have dementia his is from a stroke  and her grandmother's was progressive. Nonetheless she states even her mom who knows her condition and situation has only help two of those days to take care of them she's been taking care of the rest. Fortunately there does not appear to be any signs of active infection in regard to her toe at this point although obviously it doesn't look as good as it did previous. I think this is directly related to her not taking off the pressure and friction by way of taking things easy. Though I completely understand what's going on. 01/29/19 on evaluation today patient's tools are actually appears to be showing some signs of improvement today compared to last week's evaluation as far as not necessarily the overall size of the wound but the fact that she has some new skin growth in between the two ends of the wound opening. Overall I feel like she has done well she states that she had a family member give her what sounds to be a CAM walker boot which has been helpful as well. 02/05/19 on evaluation today patient's wound bed actually appears to be doing significantly better in regard to her overall appearance of the size of the wound. With that being said she is still having an issue with offloading efficiently enough to get this to close. Apparently there is some signs of infection at this point as well unfortunately. Previously she's done well of Augmentin I really do not see anything that needs to be culture currently but there theme  and cellulitis of the foot that I'm seeing I'm gonna go ahead and place her on an antibiotic today to try to help clear this up. 02/12/2019 on evaluation today patient actually appears to be doing poorly in regard to her overall wound status. She tells me she has been using her offloading shoe but actually comes in today wearing her tennis shoe with the AFO brace. Again as I previously discussed with her this is really not sufficient to allow the area to heal appropriately.  Nonetheless she continues to be somewhat noncompliant and I do wonder based on what she has told my nurse in the past as to whether or not a good portion of this noncompliance may be recreational drug and alcohol related. She has had a history of heroin overdose and this was fairly recently in the past couple of months that have been seeing her. Nonetheless overall I feel like her wound looks significantly worse today compared to what it was previous. She still has significant erythema despite the Augmentin I am not sure that this is an appropriate medication for her infection I am also concerned that the infection may have gone down into her bone. 02/19/19 on evaluation today patient actually appears to be doing about the same in regard to her toe ulcer. Unfortunately she continues to show signs of bone exposure and infection at this point. There does not appear to be any evidence of worsening of the infection but I'm also not really sure that it's getting significantly better. She is on the Augmentin which should be sufficient for the Staphylococcus aureus infection that she has at this point. With that being said she may need IV antibiotics to more appropriately treat this. We did have a discussion today about hyperbaric option therapy. 02/28/19 on evaluation today patient actually appears to be doing much worse in regard to the wound on her left great toe as compared to even my previous evaluation last week. Unfortunately this seems to be training in a pretty poor direction. Her toe was actually now starting to angle laterally and I can actually see the entire joint area of the proximal portion of the digit where is the distal portion of the digit again is no longer even in contact with the joint line. Unfortunately there's a lot more necrotic tissue around the edge and the toe appears to be showing signs of becoming gangrenous in my pinion. I'm very concerned about were things stand at this point.  She did see infectious disease and they are planning to send in a prescription for Sivextro for her and apparently this has been approved. With that being said I don't think she should avoid taking this but at the same time I'm not sure that it's gonna be sufficient to save her toe at this point. 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 03/05/19 on evaluation today patient actually appears to be doing quite well in regard to her toe all things considering. She still has bone exposed but there appears to be much less your thing on overall the appearance of the wound and the toe itself is dramatically improved. She still does have some issues currently obviously with infection she did see vascular as well and there concerned that her blood flow to the  toad. For that reason they are setting up for an angiogram next week. 03/14/19 on evaluation today patient appears to be doing very poor in regard to her toe and specifically in regard to the ulceration and the fact that she's starting to notice the toe was leaning even more towards the lateral aspect and the complete joint is visible on the proximal aspect of the joint. Nonetheless she's also noted a significant odor and the tip of the toe is turning more dark and necrotic appearing. Overall I think she is getting worse not better as far as this is concerned. For that reason I am recommending at this point that she likely needs to be seen for likely amputation. AMILIAH, CAMPISI ([id number]) READMISSION 03/19/2021 This is a patient that we cared for in this clinic for a prolonged period of time in 2019 and 2020 with a left foot and left first toe wound. 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 Luana Shu. In December 2021 she had an ulcer on her right great toe as well as the fourth and fifth toes. 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 She also had an angiogram at that time and underwent angioplasty of the right anterior tibial artery. In any case she claims that the wound on the right foot is closed I did not look at this today which was probably an oversight although I think that should be done next week. After her surgery she developed a dehiscence but I do not see any follow-up of this. According to Dr. Deborra Medina last review that she was out of the area being cared for by another physician but recently came back to his attention. 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 A culture of this area showed E. coli apparently before she came back to see Dr. Luana Shu she was supposed to be receiving antibiotics but she did not really take them. Nor is she offloading this area at all. Finally her last hemoglobin A1c listed in epic was in March 2022 at 14.1 she says things are a lot better since then although I am not sure. 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 She had this wound at the time. During this admission she had E. coli sepsis a TEE was negative. 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 She is still a heavy cigarette smoker. She has a history of bipolar disorder seizure disorder ABI in our clinic was 1.05 6/1; left midfoot in the setting of a TMA done previously. Round circular wound with a "knuckle" of protruding tissue. The problem is  that the knuckle was not attached to any of the surrounding granulation and this probed proximally widely I removed a large portion of this tissue. This wound goes with considerable undermining laterally. I do not feel any bone there was no purulence but this is a deep wound. 6/8; in spite of the debridement I did last week. She arrives with a wound looking exactly the same. A protruding "knuckle" of tissue nonadherent to most of the surrounding tissue. There is considerable depth  around this from 6-12 o'clock at 2.7 cm and undermining of 1 cm. This does not look overtly infected and the x-ray I did last week was negative for any osseous abnormalities. We have been using silver collagen 6/15; deep tissue culture I did last week showed moderate staph aureus and moderate Pseudomonas. This will definitely require prolonged antibiotic therapy. The pathology on the protuberant area was negative for malignancy fungus etc. 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 We have been using silver collagen. I am going to be prescribing Levaquin for 2 weeks. Her CT scan of the foot is down for 7/5 6/22; CT scan of the foot on 7 5. She says she has hardware in the left leg from her previous fracture. She is on the Levaquin for the deep tissue culture I did that showed methicillin sensitive staph aureus and Pseudomonas. I gave her a 2-week supply and she will have another week. She arrives in clinic today with the same protuberant tissue however this is nonadherent to the tissue surrounding it. I am really at a loss to explain this unless there is underlying deep tissue infection 6/29; patient presents for 1 week follow-up. She has been using collagen to the wound bed. She reports taking her antibiotics as prescribed.She has no complaints or issues today. She denies signs of infection. 7/6; patient presents for one week followup. She has been using collagen  to the wound bed. She states she is taking Levaquin however at times she is not able to keep it down. She denies signs of infection. 7/13; patient presents for 1 week follow-up. She has been using silver alginate to the wound bed. She still has nausea when taking her antibiotics. She denies signs of infection. 7/20; patient presents for 1 week follow-up. She has been using silver alginate with gentamicin cream to the wound bed. She denies any issues and has no complaints today. She denies signs of infection. 7/27; patient presents for 1 week follow-up. She continues to use silver alginate with gentamicin cream to the wound bed. She reports starting her antibiotics. She has no issues or complaints. Overall she reports stability to the wound. 8/3; patient presents for 1 week follow-up. She has been using silver alginate with gentamicin cream to the wound bed. She reports completing all antibiotics. She has no issues or complaints today. She denies signs of infection. 8/17; patient presents for 2-week follow-up. He is to use silver alginate to the wound bed. She has no issues or complaints today. She denies signs of infection. She reports her pain has improved in her foot since last clinic visit 8/24; patient presents for 1 week follow-up. She continues to use silver alginate to the wound bed. She has no issues or complaints. She denies signs of infection. Pain is stable. 9/7; patient presents for follow-up. She missed her last week appointment due to feeling ill. She continues to use silver alginate. She has a new wound to the right lower extremity that is covered in eschar. She states It occurred over the past week and has no idea how it started. She currently denies signs of infection. 9/14; patient presents for follow-up. To the left foot wound she has been using gentamicin cream and silver alginate. To the right lower extremity wound she has been keeping this covered and has not obtain  Santyl. 9/21; patient presents for follow-up. She reports using gentamicin cream and silver alginate to the left foot and Santyl to the  right lower extremity wound. She has no issues or complaints today. She denies signs of infection. 9/28; patient presents for follow-up. She reports a new wound to her right heel. She states this occurred a few days ago and is progressively gotten worse. She has been trying to clean the area with a Q-tip and Santyl. She reports stability in the other 2 wounds. She has been using gentamicin cream and silver alginate to the left foot and Santyl to the right lower extremity wound. 10/12; patient presents for follow-up. She reports improvement to the wound beds. She is seeing vein and vascular to discuss the potential of a left BKA. She states they are going to do an arteriogram. She continues to use silver alginate with dressing changes to her wounds. 11/2; patient presents for follow-up. She states she has not been doing dressing changes to the wound beds. She states she is not able to offload the areas. She reports chronic pain to her left foot wound. 11/9; patient presents for follow-up. She came in with only socks on. She states she forgot to put on shoes. It is unclear if she is doing any dressing changes. She currently denies systemic signs of infection. 11/16; patient presents for follow-up. She came again only with socks on. She states she does not wear shoes ever. It is unclear if she does dressing changes. She currently denies systemic signs of infection. 11/23; patient presents for follow-up. She wore her shoes today. It still unclear exactly what dressing she is using for each wound but she did states she obtained Dakin's solution and has been using this to the left foot wound. She currently denies signs of infection. NOLAN, LASSER ([id number]) 11/30; patient presents for follow-up. She has no issues or complaints today. She currently denies signs of  infection. 12/7; patient presents for follow-up. She has no issues or complaints today. She has been using Hydrofera Blue to the right heel wound and Dakin solution to the left foot wound. Her right anterior leg wound is healed. She currently denies signs of infection. 12/14; patient presents for follow-up. She has been using Hydrofera Blue to the right heel and Dakin's to the left foot wounds. She has no issues or complaints today. She denies signs of infection. 12/21; patient presents for follow-up. She reports using Hydrofera Blue to the right heel and Dakin's to the left foot wound. She denies signs of infection. 12/28; patient presents for follow-up. She continues to use Dakin's to the left foot wound and Hydrofera Blue to the right heel wound. She denies signs of infection. 1/4; patient presents for follow-up. She has no issues or complaints today. She denies signs of infection. 1/11; patient presents for follow-up. It is unclear if she has been dressing these wounds over the past week. She currently denies signs of infection. 1/18; patient presents for follow-up. She states she has been using Dakin's wet-to-dry dressings to the left foot. She has been using Hydrofera Blue to the right foot foot wound. She states that the anterior right leg wound has reopened and draining serous fluid. She denies signs of infection. 1/25; patient presents for follow-up. She has no issues or complaints today. 2/1; patient presents for follow-up. She has no issues or complaints today. She denies signs of infection. 2/8; patient presents for follow-up. She has lost her surgical shoes. She did not have a dressing to the right heel wound. She currently denies signs of infection. 2/15; patient presents for follow-up. She reports more pain to  the right heel today. She denies purulent drainage Or fever/chills 2/22; patient presents for follow-up. She reports taking clindamycin over the past week. She states that  she continues to have pain to her right heel. She reports purulent drainage. Readmission 03/16/2022 [name] is a 46 year old female with a past medical history of type 2 diabetes, osteomyelitis to her feet, chronic systolic heart failure and bipolar disorder that presents to the clinic for bilateral feet wounds and right lower extremity wound. She was last seen in our clinic on 12/15/2021. At that time she had purulent drainage coming out of her right plantar foot and I recommended she go to the ED. She states she went to [facility] and has been there for the past 3 months. I cannot see the records. She states she had OR debridement and was on several weeks of IV antibiotics while inpatient. Since discharge she has not been taking care of the wound beds. She had nothing on her feet other than socks today. She currently denies signs of infection. 5/31; patient presents for follow-up. She has been using Dakin's wet-to-dry dressings to the wound beds on her feet bilaterally and antibiotic ointment to the right anterior leg wound. She had a wound culture done at last clinic visit that showed moderate Pseudomonas aeruginosa sensitive to ciprofloxacin. She currently denies systemic signs of infection. 6/14; patient presents for follow-up. She received Keystone 5 days ago and has been using this on the wound beds. She states that last week she had to go to the hospital because she had increased warmth and erythema to the right foot. She was started on 2 oral antibiotics. She states she has been taking these. She currently denies systemic signs of infection. She has no issues or complaints today. 6/21; patient presents for follow-up. She states she has been using Keystone antibiotics to the wound beds. She has no issues or complaints today. She denies signs of infection. 6/28; patient presents for follow-up. She has been using Keystone antibiotics to the wound beds. She has no issues or  complaints today. 7/12; patient presents for follow-up. Has been using Keystone antibiotics to the wound beds with calcium alginate. She has no issues or complaints today. She never followed up with her orthopedic surgeon who did the OR debridement to the right foot. We discussed the total contact cast for the left foot and patient would like to do this next week. 7/19; patient presents for follow-up. She has been using Keystone antibiotics with calcium alginate to the wound beds. She has no issues or complaints today. Patient is in agreement to do the total contact cast of the left foot today. She knows to return later this week for the obligatory cast change. 05-13-2022 upon evaluation today patient's wound which she has the cast of the left leg actually appears to be doing significantly better. Fortunately I do not see any signs of active infection locally or systemically which is great news and overall I am extremely pleased with where we stand currently. Objective Carrithers, Chanise A. ([id number]) Constitutional Well-nourished and well-hydrated in no acute distress. Vitals Time Taken: 10:48 AM, Temperature: 98.9 F, Pulse: 66 bpm, Respiratory Rate: 18 breaths/min, Blood Pressure: 103/67 mmHg. Respiratory normal breathing without difficulty. Psychiatric this patient is able to make decisions and demonstrates good insight into disease process. Alert and Oriented x 3. pleasant and cooperative. General Notes: Patient's wound is showing signs of excellent epithelization I think the total contact cast is definitely doing a great  job here. Integumentary (Hair, Skin) Wound #11 status is Open. Original cause of wound was Surgical Injury. The date acquired was: 12/01/2021. The wound has been in treatment 8 weeks. The wound is located on the [place]. The wound measures 2.3cm length x 0.5cm width x 0.9cm depth; 0.903cm^2 area and 0.813cm^3 volume. There is Fat Layer (Subcutaneous Tissue)  exposed. There is a medium amount of serosanguineous drainage noted. The wound margin is distinct with the outline attached to the wound base. There is medium (34-66%) pink, pale granulation within the wound bed. There is a medium (34-66%) amount of necrotic tissue within the wound bed including Adherent Slough. Wound #12 status is Open. Original cause of wound was Pressure Injury. The date acquired was: 03/16/2020. The wound has been in treatment 8 weeks. The wound is located on the [place]. The wound measures 2.5cm length x 1.5cm width x 0.2cm depth; 2.945cm^2 area and 0.589cm^3 volume. There is Fat Layer (Subcutaneous Tissue) exposed. There is a medium amount of serous drainage noted. The wound margin is flat and intact. There is large (67-100%) red, pink granulation within the wound bed. There is a small (1-33%) amount of necrotic tissue within the wound bed including Adherent Slough. 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 There was a Total Contact Cast Procedure by Tommie Sams., PA-C. Post procedure Diagnosis Wound #12: Same as Pre-Procedure Plan Follow-up Appointments: Return Appointment in 1 week. Nurse Visit as needed Bathing/ Shower/ Hygiene: Wash wounds with antibacterial soap and water. No tub bath. Anesthetic (Use 'Patient Medications' Section for Anesthetic Order Entry): Lidocaine applied to wound bed Edema Control - Lymphedema / Segmental Compressive Device /  Other: Elevate, Exercise Daily and Avoid Standing for Long Periods of Time. Elevate legs to the level of the heart and pump ankles as often as possible Elevate leg(s) parallel to the floor when sitting. Off-Loading: Sendejo, Donni A. ([id number]) Total Contact Cast to Left Lower Extremity - contact cast #2 applied Don't get cast wet Other: - keep pressure off of feet. Medications-Please add to medication list.: [employer] - Keystone gel WOUND #11: - Foot Wound Laterality: Plantar, Right Cleanser: Soap and Water 1 x Per Day/30 Days Discharge Instructions: Gently cleanse wound with antibacterial soap, rinse and pat dry prior to dressing wounds Topical: keystone gel 1 x Per Day/30 Days Primary Dressing: Aquacel Extra Hydrofiber Dressing, 4x5 (in/in) (Generic) 1 x Per Day/30 Days Discharge Instructions: packed into wound Secondary Dressing: ABD Pad 5x9 (in/in) (Generic) 1 x Per Day/30 Days Discharge Instructions: Cover with ABD pad Secured With: Medipore Tape - 65M Medipore H Soft Cloth Surgical Tape, 2x2 (in/yd) (Generic) 1 x Per Day/30 Days Secured With: Kerlix Roll Sterile or Non-Sterile 6-ply 4.5x4 (yd/yd) (Generic) 1 x Per Day/30 Days Discharge Instructions: Apply Kerlix as directed WOUND #12: - Foot Wound Laterality: Plantar, Left, Medial Cleanser: Soap and Water 1 x Per Day/30 Days Discharge Instructions: Gently cleanse wound with antibacterial soap, rinse and pat dry prior to dressing wounds Topical: keystone gel 1 x Per Day/30 Days Discharge Instructions: apply very thin  layer to wound Primary Dressing: Aquacel Extra Hydrofiber Dressing, 4x5 (in/in) (Generic) 1 x Per Day/30 Days Discharge Instructions: placed over keystone gel Secondary Dressing: ABD Pad 5x9 (in/in) (Generic) 1 x Per Day/30 Days Discharge Instructions: Cover with ABD pad Secured With: Medipore Tape - 67M Medipore H Soft Cloth Surgical Tape, 2x2 (in/yd) (Generic) 1 x Per Day/30 Days Secured With: Kerlix Roll  Sterile or Non-Sterile 6-ply 4.5x4 (yd/yd) (Generic) 1 x Per Day/30 Days Discharge Instructions: Apply Kerlix as directed 1. Would recommend currently that we going continue with the wound care measures as before and the patient is in agreement with plan this includes the use of the [facility] topical antibiotics followed by the Aquacel which I think is doing a good job based on what we are seeing. 2. I am also going to go ahead and change out her cast and we will reapply that today. Overall I think she is doing quite well. She will follow-up with Dr. Heber Claypool on Wednesday. Electronic Signature(s) Signed: 05/13/2022 11:28:27 AM By: Worthy Keeler PA-C Entered By: Worthy Keeler on 05/13/2022 11:28:27 [name] ([id number]) -------------------------------------------------------------------------------- Total Contact Cast Details Patient Name: Heather Dear A. Date of Service: 05/13/2022 10:30 AM Medical Record Number: [id number] Patient Account Number: [id number] Date of Birth/Sex: Jul 16, 1976 (46 y.o. F) Treating RN: Cornell Barman Primary Care Provider: Raelene Bott Other Clinician: Massie Kluver Referring Provider: Raelene Bott Treating Provider/Extender: Skipper Cliche in Treatment: 8 Total Contact Cast Applied for Wound Assessment: Wound #12 Left,Medial,Plantar Foot Performed By: Physician Tommie Sams., PA-C Post Procedure Diagnosis Same as Pre-procedure Electronic Signature(s) Signed: 05/13/2022 4:22:23 PM By: Worthy Keeler PA-C Signed: 05/13/2022 4:34:39 PM By: Massie Kluver Entered By: Massie Kluver on 05/13/2022 11:17:35 Donigan, Marvel A. ([id number]) -------------------------------------------------------------------------------- SuperBill Details Patient Name: Heather Dear A. Date of Service: 05/13/2022 Medical Record Number: [id number] Patient Account Number: [id number] Date of Birth/Sex: 01/13/1976 (46 y.o. F) Treating RN: Cornell Barman Primary Care Provider:  Raelene Bott Other Clinician: Massie Kluver Referring Provider: Raelene Bott Treating Provider/Extender: Skipper Cliche in Treatment: 8 Diagnosis Coding ICD-10 Codes Code Description [phone] 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ity 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.621 Type 2 diabetes mellitus with foot ulcer E11.42 Type 2 diabetes mellitus with diabetic polyneuropathy L97.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 of skin Facility Procedures CPT4 Code: [id number] Description: [phone] - APPLY TOTAL CONTACT LEG CAST Modifier: Quantity: 1 CPT4 Code: Description: ICD-10 Diagnosis Description 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 Modifier: severity Quantity: Physician Procedures CPT4 Code: [id number] Description: 01007 - WC PHYS APPLY TOTAL CONTACT CAST Modifier: Quantity: 1 CPT4 Code: Description: ICD-10 Diagnosis Description 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 Modifier: severity Quantity: Electronic Signature(s) Signed: 05/13/2022 11:33:10 AM By: Worthy Keeler PA-C Entered By: Worthy Keeler on 05/13/2022 11:33:10

## 2022-05-18 ENCOUNTER — Encounter (HOSPITAL_BASED_OUTPATIENT_CLINIC_OR_DEPARTMENT_OTHER): Payer: Medicaid Other | Admitting: Internal Medicine

## 2022-05-18 DIAGNOSIS — E11621 Type 2 diabetes mellitus with foot ulcer: Secondary | ICD-10-CM | POA: Diagnosis not present

## 2022-05-18 DIAGNOSIS — L97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ity: Secondary | ICD-10-CM | POA: Diagnosis not present

## 2022-05-18 DIAGNOSIS — L97512 Non-pressure chronic ulcer of other part of right foot with fat layer exposed: Secondary | ICD-10-CM | POA: Diagnosis not present

## 2022-05-18 LAB — GLUCOSE, CAPILLARY: Glucose-Capillary: 155 mg/dL — ABNORMAL HIGH (ref 70–99)

## 2022-05-19 NOTE — Progress Notes (Signed)
DENISS, WORMLEY ([id number]) Visit Report for 05/18/2022 Chief Complaint Document Details Patient Name: GAYLEEN, SHOLTZ A. Date of Service: 05/18/2022 11:15 AM Medical Record Number: [id number] Patient Account Number: [id number] Date of Birth/Sex: 15-Jul-1976 (46 y.o. F) Treating RN: Cornell Barman Primary Care Provider: Raelene Bott Other Clinician: Massie Kluver Referring Provider: Raelene Bott Treating Provider/Extender: Yaakov Guthrie in Treatment: 9 Information Obtained from: Patient Chief Complaint 03/19/2021; patient referred by Dr. Luana Shu who has been looking after her left foot for quite a period of time for review of a nonhealing area in the left midfoot 03/12/2022; bilateral feet wounds and right lower extremity wound. Electronic Signature(s) Signed: 05/18/2022 12:24:08 PM By: Kalman Shan DO Entered By: Kalman Shan on 05/18/2022 12:06:06 [name] ([id number]) -------------------------------------------------------------------------------- Debridement Details Patient Name: [name] Date of Service: 05/18/2022 11:15 AM Medical Record Number: [id number] Patient Account Number: [id number] Date of Birth/Sex: Jan 29, 1976 (46 y.o. F) Treating RN: Cornell Barman Primary Care Provider: Raelene Bott Other Clinician: Massie Kluver Referring Provider: Raelene Bott Treating Provider/Extender: Yaakov Guthrie in Treatment: 9 Debridement Performed for Wound #12 Left,Medial,Plantar Foot Assessment: Performed By: Physician Kalman Shan, MD Debridement Type: Debridement Severity of Tissue Pre Debridement: Fat layer exposed Level of Consciousness (Pre- Awake and Alert procedure): Pre-procedure Verification/Time Out Yes - 11:44 Taken: Start Time: 11:44 Total Area Debrided (L x W): 4 (cm) x 2 (cm) = 8 (cm) Tissue and other material Viable, Non-Viable, Callus, Slough, Subcutaneous, Slough debrided: Level: Skin/Subcutaneous  Tissue Debridement Description: Excisional Instrument: Curette Bleeding: Minimum Hemostasis Achieved: Pressure End Time: 11:47 Response to Treatment: Procedure was tolerated well Level of Consciousness (Post- Awake and Alert procedure): Post Debridement Measurements of Total Wound Length: (cm) 3.9 Width: (cm) 1.5 Depth: (cm) 0.3 Volume: (cm) 1.378 Character of Wound/Ulcer Post Debridement: Stable Severity of Tissue Post Debridement: Fat layer exposed Post Procedure Diagnosis Same as Pre-procedure Electronic Signature(s) Signed: 05/18/2022 12:24:08 PM By: Kalman Shan DO Signed: 05/18/2022 4:29:23 PM By: Gretta Cool, BSN, RN, CWS, [name] RN, BSN Signed: 05/19/2022 4:20:19 PM By: Massie Kluver Entered By: Massie Kluver on 05/18/2022 11:48:58 Shurtz, Jamerica A. ([id number]) -------------------------------------------------------------------------------- Debridement Details Patient Name: [name] Date of Service: 05/18/2022 11:15 AM Medical Record Number: [id number] Patient Account Number: [id number] Date of Birth/Sex: 1976/07/01 (46 y.o. F) Treating RN: Cornell Barman Primary Care Provider: Raelene Bott Other Clinician: Massie Kluver Referring Provider: Raelene Bott Treating Provider/Extender: Yaakov Guthrie in Treatment: 9 Debridement Performed for Wound #11 Right,Plantar Foot Assessment: Performed By: Physician Kalman Shan, MD Debridement Type: Debridement Level of Consciousness (Pre- Awake and Alert procedure): Pre-procedure Verification/Time Out Yes - 11:49 Taken: Start Time: 11:49 Total Area Debrided (L x W): 2.1 (cm) x 0.7 (cm) = 1.47 (cm) Tissue and other material Viable, Non-Viable, Callus, Slough, Subcutaneous, Slough debrided: Level: Skin/Subcutaneous Tissue Debridement Description: Excisional Instrument: Curette Bleeding: Minimum Hemostasis Achieved: Pressure End Time: 11:55 Response to Treatment: Procedure was tolerated well Level of  Consciousness (Post- Awake and Alert procedure): Post Debridement Measurements of Total Wound Length: (cm) 2.1 Width: (cm) 0.7 Depth: (cm) 0.9 Volume: (cm) 1.039 Character of Wound/Ulcer Post Debridement: Stable Post Procedure Diagnosis Same as Pre-procedure Electronic Signature(s) Signed: 05/18/2022 12:24:08 PM By: Kalman Shan DO Signed: 05/18/2022 4:29:23 PM By: Gretta Cool, BSN, RN, CWS, [name] RN, BSN Signed: 05/19/2022 4:20:19 PM By: Massie Kluver Entered By: Massie Kluver on 05/18/2022 11:55:49 Teo, Surena A. ([id number]) -------------------------------------------------------------------------------- HPI Details Patient Name: [name] Date of Service: 05/18/2022 11:15 AM Medical Record Number: [id number] Patient Account Number: [id number] Date  of Birth/Sex: [birth year] (46 y.o. F) Treating RN: Cornell Barman Primary Care Provider: Raelene Bott Other Clinician: Massie Kluver Referring Provider: Raelene Bott Treating Provider/Extender: Yaakov Guthrie in Treatment: 9 History of Present Illness HPI Description: 01/18/18-She is here for initial evaluation of the left great toe ulcer. She is a poor historian in regards to timeframe in detail. She states approximately 4 weeks ago she lacerated her toe on something in the house. She followed up with her primary care who placed her on Bactrim and ultimately a second dose of Bactrim prior to coming to wound clinic. She states she has been treating the toe with peroxide, Betadine and a Band-Aid. She did not check her blood sugar this morning but checked it yesterday morning it was 327; she is unaware of a recent A1c and there are no current records. She saw Dr. she would've orthopedics last week for an old injury to the left ankle, she states he did not see her toe, nor did she bring it to his attention. She smokes approximately 1 pack cigarettes a day. Her social situation is concerning, she arrives this morning with her  mother who appears extremely intoxicated/under the influence; her mother was asked to leave the room and be monitored by the patient's grandmother. The patient's aunt then accompanied the patient and the room throughout the rest of the appointment. We had a lengthy discussion regarding the deleterious effects of uncontrolled hyperglycemia and smoking as it relates to wound healing and overall health. She was strongly encouraged to decrease her smoking and get her diabetes under better control. She states she is currently on a diet and has cut down her [facility] consumption. The left toe is erythematous, macerated and slightly edematous with malodor present. The edema in her left foot is below her baseline, there is no erythema streaking. We will treat her with Santyl, doxycycline; we have ordered and xray, culture and provided a Peg assist surgical shoe and cultured the wound. 01/25/18-She is here in follow-up evaluation for a left great toe ulcer and presents with an abscess to her suprapubic area. She states her blood sugars remain elevated, feeling "sick" and if levels are below 250, but she is trying. She has made no attempt to decrease her smoking stating that we "can't take away her food in her cigarettes". She has been compliant with offloading using the PEG assist you. She is using Santyl daily. the culture obtained last week grew staph aureus and Enterococcus faecalis; continues on the doxycycline and Augmentin was added on Monday. The suprapubic area has erythema, no femoral variation, purple discoloration, minimal induration, was accessed with a cotton tip applicator with sanguinopurulent drainage, this was cultured, I suspect the current antibiotic treatment will cover and we will not add anything to her current treatment plan. She was advised to go to urgent care or ER with any change in redness, induration or fever. 02/01/18-She is here in follow-up evaluation for left great toe ulcers  and a new abdominal abscess from last week. She was able to use packing until earlier this week, where she "forgot it was there". She states she was feeling ill with GI symptoms last week and was not taking her antibiotic. She states her glucose levels have been predominantly less than 200, with occasional levels between 200-250. She thinks this was contributing to her GI symptoms as they have resolved without intervention. There continues to be significant laceration to left toe, otherwise it clinically looks stable/improved. There is now less  superficial opening to the lateral aspect of the great toe that was residual blister. We will transition to [facility] to all wounds, she will continue her Augmentin. If there is no change or deterioration next week for reculture. 02/08/18-She is here in follow-up evaluation for left great toe ulcer and abdominal ulcer. There is an improvement in both wounds. She has been wrapping her left toe with coban, not by our direction, which has created an area of discoloration to the medial aspect; she has been advised to NOT use coban secondary to her neuropathy. She states her glucose levels have been high over this last week ranging from 200-350, she continues to smoke. She admits to being less compliant with her offloading shoe. We will continue with same treatment plan and she will follow-up next week. 02/15/18-She is here in follow-up evaluation for left great toe ulcer and abdominal ulcer. The abdominal ulcer is epithelialized. The left great toe ulcer is improved and all injury from last week using the Coban wrap is resolved, the lateral ulcer is healed. She admits to noncompliance with wearing offloading shoe and admits to glucose levels being greater than 300 most of the week. She continues to smoke and expresses no desire to quit. There is one area medially that probes deeper than it has historically, erythema to the toe and dorsal foot has consistently  waxed and waned. There is no overt signs of cellulitis or infection but we will culture the wound for any occult infection given the new area of depth and erythema. We will hold off on sensitivities for initiation of antibiotic therapy. 02/22/18-She is here in follow up evaluation for left great toe ulcer. There is overall significant improvement in both wound appearance, erythema and edema with changes made last week. She was not initiated on antibiotic therapy. Culture obtained last week showed oxacillin sensitive staph aureus, sensitive to clindamycin. Clindamycin has been called into the pharmacy but she has been instructed to hold off on initiation secondary to overall clinical improvement and her history of antibiotic intolerance. She has been instructed to contact the clinic with any noted changes/deterioration and the wound, erythema, edema and/or pain. She will follow-up next week. She continues to smoke and her glucose levels remain elevated >250; she admits to compliance with offloading shoe 03/01/18 on evaluation today patient appears to be doing fairly well in regard to her left first toe ulcer. She has been tolerating the dressing changes with the [facility] Dressing without complication and overall this has definitely showed signs of improvement according to records as well is what the patient tells me today. I'm very pleased in that regard. She is having no pain today 03/08/18 She is here for follow up evaluation of a left great toe ulcer. She remains non-compliant with glucose control and smoking cessation; glucose levels consistently >200. She states that she got new shoe inserts/peg assist. She admits to compliance with offloading. Since my last evaluation there is significant improvement. We will switch to prisma at this time and she will follow up next week. She is noted to be tachycardic at this appointment, heart rate 120s; she has a history of heart rate 70-130 according to our  records. She admits to extreme agitation r/t personal issues; she was advised to monitor her heartrate and contact her physician if it does not return to a more normal range (<100). She takes cardizem twice daily. 03/15/18-She is here in follow-up evaluation for left great toe ulcer. She remains noncompliant with glucose  control and smoking cessation. She admits to compliance with wearing offloading shoe. The ulcer is improved/stable and we will continue with the same treatment plan and she will follow-up next week 03/22/18-She is here for evaluation for left great toe ulcer. There continues to be significant improvement despite recurrent hyperglycemia (over 500 yesterday) and she continues to smoke. She has been compliant with offloading and we will continue with same treatment plan and she will follow-up next week. 03/29/18-She is here for evaluation for left great toe ulcer. Despite continuing to smoke and uncontrolled diabetes she continues to improve. She is compliant with offloading shoe. We will continue with the same treatment plan and she will follow-up next week 04/05/18- She is here in follow up evaluation for a left great toe ulcer; she presents with small pustule to left fifth toe (resembles ant bite). She admits to compliance with wearing offloading shoe; continues to smoke or have uncontrolled blood glucose control. There is more callus than usual with evidence of bleeding; she denies known trauma. 04/12/18-She is here for evaluation of left great toe ulcer. Despite noncompliance with glycemic control and smoking she continues to make Davidovich, Adin A. ([id number]) improvement. She continues to wear offloading shoe. The pustule, that was identified last week, to the left fifth toe is resolved. She will follow-up in 2 weeks 05/03/18-she is seen in follow-up evaluation for a left great toe ulcer. She is compliant with offloading, otherwise noncompliant with glycemic control and smoking. She  has plateaued and there is minimal improvement noted. We will transition to [facility], replaced the insert to her surgical shoe and she will follow-up in one week 05/10/18- She is here in follow up evaluation for a left great toe ulcer. It appears stable despite measurement change. We will continue with same treatment plan and follow up next week. 05/24/18-She is seen in follow-up evaluation for a left great toe ulcer. She remains compliant with offloading, has made significant improvement in her diet, decreasing the amount of sugar/soda. She said her recent A1c was 10.9 which is lower than. She did see a diabetic nutritionist/educator yesterday. She continues to smoke. We will continue with the same treatment plan and she'll follow-up next week. 05/31/18- She is seen in follow-up evaluation for left great toe ulcer. She continues to remain compliant with offloading, continues to make improvement in her diet, increasing her water and decreasing the amount of sugar/soda. She does continue to smoke with no desire to quit. We will apply Prisma to the depth and Hydrofera Blue over. We have not received insurance authorization for oasis. She will follow up next week. 06/07/18-She is seen in follow-up evaluation for left great toe ulcer. It has stalled according to today's measurements although base appears stable. She says she saw a diabetic educator yesterday; her average blood sugars are less than 300 which is an improvement for her. She continues to smoke and states "that's my next step" She continues with water over soda. We will order for xray, culture and reinstate ace wrap compression prior to placing apligraf for next week. She is voicing no complaints or concerns. Her dressing will change to iodoflex over the next week in preparation for apligraf. 06/14/18-She is seen in follow-up evaluation for left great toe ulcer. Plain film x-ray performed last week was negative for osteomyelitis. Wound culture  obtained last week grew strep B and OSSA; she is initiated on keflex and cefdinir today; there is erythema to the toe which could be from ace wrap compression,  she has a history of wrapping too tight and has has been encouraged to maintain ace wraps that we place today. We will hold off on application of apligraf today, will apply next week after antibiotic therapy has been initiated. She admits today that she has resumed taking a shower with her foot/toe submerged in water, she has been reminded to keep foot/toe out of the bath water. She will be seen in follow up next week 06/21/18-she is seen in follow-up evaluation for left great toe ulcer. She is tolerating antibiotic therapy with no GI disturbance. The wound is stable. Apligraf was applied today. She has been decreasing her smoking, only had 4 cigarettes yesterday and 1 today. She continues being more compliant in diabetic diet. She will follow-up next week for evaluation of site, if stable will remove at 2 weeks. 06/28/18- She is here in follow up evalution. Apligraf was placed last week, she states the dressing fell off on Tuesday and she was dressing with hydrofera blue. She is healed and will be discharged from the clinic today. She has been instructed to continue with smoking cessation, continue monitoring glucose levels, offloading for an additional 4 weeks and continue with hydrofera blue for additional two weeks for any possible microscopic opening. Readmission: 08/07/18 on evaluation today patient presents for reevaluation concerning the ulcer of her right great toe. She was previously discharged on 06/28/18 healed. Nonetheless she states that this began to show signs of drainage she subsequently went to her primary care provider. Subsequently an x-ray was performed on 08/01/18 which was negative. The patient was also placed on antibiotics at that time. Fortunately they should have been effective for the infection. Nonetheless she's been  experiencing some improvement but still has a lot of drainage coming from the wound itself. 08/14/18 on evaluation today patient's wound actually does show signs of improvement in regard to the erythema at this point. She has completed the antibiotics. With that being said we did discuss the possibility of placing her in a total contact cast as of today although I think that I may want to give this just a little bit more time to ensure nothing recurrence as far as her infection is concerned. I do not want to put in the cast and risk infection at that time if things are not completely resolved. With that being said she is gonna require some debridement today. 08/21/18 on evaluation today patient actually appears to be doing okay in regard to her toe ulcer. She's been tolerating the dressing changes without complication. With that being said it does appear that she is ready and in fact I think it's appropriate for [place] to go ahead and initiate the total contact cast today. Nonetheless she will require some sharp debridement to prepare the wound for application. Overall I feel like things have been progressing well but we do need to do something to get this to close more readily. 08/24/18 patient seen today for reevaluation after having had the total contact cast applied on Tuesday. She seems to have done very well the wound appears to be doing great and overall I'm pleased with the progress that she's made. There were no abnormal areas of rubbing from the cast on her lower extremity. 08/30/18 on evaluation today patient actually appears to be completely healed in regard to her plantar toe ulcer. She tells me at this point she's been having a lot of issues with the cast. She almost fell a couple of times the state shall the  step of her dog a couple times as well. This is been a very frustrating process for her other nonetheless she has completely healed the wound which is excellent news. Overall there does  not appear to be the evidence of infection at this time which is great news. 09/11/18 evaluation today patient presents for follow-up concerning her great toe ulcer on the left which has unfortunately reopened since I last saw her which was only a couple of weeks ago. Unfortunately she was not able to get in to get the shoe and potentially the AFO that's gonna be necessary due to her left foot drop. She continues with offloading shoe but this is not enough to prevent her from reopening it appears. When we last had her in the total contact cast she did well from a healing standpoint but unfortunately the wound reopened as soon as she came out of the cast within just a couple of weeks. 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 This is also part of the reason why she falls on a regular basis. I just do not believe that is gonna be safe for [place] to reinitiate the total contact cast as last time we had this on she fell 3 times one week which is definitely not normal for her. 09/18/18 upon evaluation today the patient actually appears to be doing about the same in regard to her toe ulcer. She did not contact [facility] as I asked her to even though I had given her the prescription. In fact she actually states that she has no idea where the prescription is. She did apparently call [employer] and they told her that all she needed to do was bring the prescription in order to be able to be seen and work on getting the AFO for her left foot. With all that being said she still does not have an appointment and I'm not sure were things stand that regard. I will give her a new prescription today in order to contact them to get this set up. 09/25/18 on evaluation today patient actually appears to be doing about the same in regard  to her toes ulcer. She does have a small areas which seems to have a lot of callous buildup around the edge of the wound which is going to need sharp debridement today. She still is waiting to be scheduled for evaluation with [facility] for possibility of an AFO. She states there supposed to call her tomorrow to get this set up. Unfortunately it does appear that her foot specifically the toe area is showing signs of erythema. There does not appear to be any systemic infection which is in these good news. ALETHIA, MELENDREZ A. ([id number]) 10/02/18 on evaluation today patient actually appears to be doing about the same in regard to her toe ulcer. This really has not done too well although it's not significantly larger it's also not significantly smaller. She has been tolerating the dressing changes without complication. She actually has her appointment with [employer] and Kathryn tomorrow to hopefully be measured for obtaining and AFO splint. I think this would be helpful preventing this from reoccurring. We had contemplated starting the cast this week although to be honest I am reluctant to do that as she's been having nausea, vomiting, and seizure  activity over the past three days. She has a history of seizures and have been told is nothing that can be done for these. With that being said I do believe that along with the seizures have the nausea vomiting which upon further questioning doesn't seem to be the normal for her and makes me concerned for the possibility of infection or something else going on. I discussed this with the patient and her mother during the office visit today. I do not feel the wound is effective but maybe something else. The responses this was "this just happens to her at times and we don't know why". They did not seem to be interested in going to the hospital to have this checked out further. 10/09/18 on evaluation today patient presents for follow-up concerning her ongoing toe ulcer.  She has been tolerating the dressing changes without complication. Fortunately there does not appear to be any evidence of infection which is great news however I do think that the patient would benefit from going ahead for with the total contact cast. She's actually in a wheelchair today she tells me that she will use her walker if we initiate the cast. I was very specific about the fact that if we were gonna do the cast I wanted to make sure that she was using the walker in order to prevent any falls. She tells me she does not have stairs that she has to traverse on a regular basis at her home. She has not had any seizures since last week again that something that happens to her often she tells me she did talk to [employer] and they said that it may take up to three weeks to get the brace approved for her. Hopefully that will not take that long but nonetheless in the meantime I do think the cast could be of benefit. 10/12/18 on evaluation today patient appears to be doing rather well in regard to her toe ulcer. It's just been a few days and already this is significantly improved both as far as overall appearance and size. Fortunately there's no sign of infection. She is here for her first obligatory cast change. 10/19/18 Seen today for follow up and management of left great toe ulcer. Wound continues to show improvement. Noted small open area with seroussang drainage with palpation. Denies any increased pain or recent fevers during visit. She will continue calcium alginate with offloading shoe. Denies any questions or concerns during visit. 10/26/18 on evaluation today patient appears to be doing about the same as when I last saw her in regard to her wound bed. Fortunately there does not appear to be any signs of infection. Unfortunately she continues to have a breakdown in regard to the toe region any time that she is not in the cast. It takes almost no time at all for this to happen. Nonetheless she  still has not heard anything from the brace being made by [employer] as to when exactly this will be available to her. Fortunately there is no signs of infection at this time. 10/30/18 on evaluation today patient presents for application of the total contact cast as we just received him this morning. Fortunately we are gonna be able to apply this to her today which is great news. She continues to have no significant pain which is good news. Overall I do feel like things have been improving while she was the cast is when she doesn't have a cast that things get worse. She still has not  really heard anything from [facility] regarding her brace. 11/02/18 upon evaluation today patient's wound already appears to be doing significantly better which is good news. Fortunately there does not appear to be any signs of infection also good news. Overall I do think the total contact cast as before is helping to heal this area unfortunately it's just not gonna likely keep the area closed and healed without her getting her brace at least. Again the foot drop is a significant issue for her. 11/09/18 on evaluation today patient appears to be doing excellent in regard to her toe ulcer which in fact is completely healed. Fortunately we finally got the situation squared away with the paperwork which was needed to proceed with getting her brace approved by Medicaid. I have filled that out unfortunately that information has been sent to the orthopedic office that I worked at 2 1/2 years ago and not tired Current wound care measures. Fortunately she seems to be doing very well at this time. 11/23/18 on evaluation today patient appears to be doing More Poorly Compared to Last Time I Saw Her. At [facility] She Had Completely Healed. Currently she is continuing to have issues with reopening. She states that she just found out that the brace was approved through Medicaid now she just has to go get measured in order to have this fitted for  her and then made. Subsequently she does not have an appointment for this yet that is going to complicate things we obviously cannot put her back in the cast if we do not have everything measured because they're not gonna be able to measure her foot while she is in the cast. 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 Obviously this is unfortunate and does have me somewhat worried as well. 11/30/18 on evaluation today patient's toe ulcer actually appears to be doing fairly well. The good news is she will be getting her brace in the shoes next week on Wednesday. Hopefully we will be able to get this to heal without having to go back in the cast however she may need the cast in order to get the wound completely heal and then go from there. Fortunately there's no signs of infection at this time. 12/07/18 on evaluation today patient fortunately did receive her brace and she states she could tell this definitely makes her walk better. With that being said she's been having issues with her toe where she noticed yesterday there was a lot of tissue that was loosing off this appears to be much larger than what it was previous. She also states that her leg has been read putting much across the top of her foot just about the ankle although this seems to be receiving somewhat. The total area is still red and appears to be someone infected as best I can tell. She is previously taken Bactrim and that may be a good option for her today as well. We are gonna see what I wound culture shows as well and I think that this is definitely appropriate. With that being said outside of the culture I still need to initiate something in the interim and that's what I'm gonna go ahead and select Bactrim is a good option for her. 12/14/18 on evaluation today patient appears to be doing better in regard to her left great toe ulcer as  compared to last week's evaluation. There's  still some erythema although this is significantly improved which is excellent news. Overall I do believe that she is making good progress is still gonna take some time before she is where I would like her to be from the standpoint of being able to place her back into the total contact cast. Hopefully we will be where we need to be by next week. 12/21/18 on evaluation today patient actually appears to be doing poorly in regard to her toe ulcer. She's been tolerating the dressing changes without complication. Fortunately there's no signs of systemic infection although she does have a lot of drainage from the toe ulcer and this does seem to be causing some issues at this point. She does have erythema on the distal portion of her toe that appears to be likely cellulitis. 12/28/18 on evaluation today patient actually appears to be doing a little better in my pinion in regard to her toe ulcer. With that being said she still does have some evidence of infection at this time and for her culture she had both E. coli as well as enterococcus as organisms noted on evaluation. For that reason I think that though the Keflex likely has treated the E. coli rather well this has really done nothing for the enterococcus. We are going to have to initiate treatment for this specifically. MAELYN, BERREY A. ([id number]) 01/04/19 on evaluation today patient's toe actually appears to be doing better from the standpoint of infection. She currently would like to see about putting the cash back on I think that this is appropriate as long as she takes care of it and keeps it from getting wet. She is gonna have some drainage we can definitely pass this up with Drawtex and alginate to try to prevent as much drainage as possible from causing the problems. With that being said I do want to at least try her with the cast between now and Tuesday. If there any issues we can't continue to use  it then I will discontinue the use of the cast at that point. 01/08/19 on evaluation today patient actually appears to be doing very well as far as her foot ulcer specifically the great toe on the left is concerned. She did have an area of rubbing on the medial aspect of her left ankle which again is from the cast. Fortunately there's no signs of infection at this point in this appears to be a very slight skin breakdown. The patient tells me she felt it rubbing but didn't think it was that bad. Fortunately there is no signs of active infection at this time which is good news. No fevers, chills, nausea, or vomiting noted at this time. 01/15/19 on evaluation today patient actually appears to be doing well in regard to her toe ulcer. Again as previous she seems to do well and she has the cast on which indicates to me that during the time she doesn't have a cast on she's putting way too much pressure on this region. Obviously I think that's gonna be an issue as with the current national emergency concerning the Covid-19 Virus it has been recommended that we discontinue the use of total contact casting by the chief medical officer of our company, Dr. Simona Huh. The reasoning is that if a patient becomes sick and cannot come into have the cast removed they could not just leave this on for an additional two weeks. Obviously the hospitals also do not want to receive patient's who are sick into the emergency  department to potentially contaminate the region and spread the Covid-19 Virus among other sick individuals within the hospital system. Therefore at this point we are suspending the use of total contact cast until the current emergency subsides. This was all discussed with the patient today as well. 01/22/19 on evaluation today patient's wound on her left great toe appears to be doing slightly worse than previously noted last week. She tells me that she has been on this quite a bit in fact she tells me she's  been awake for 38 straight hours. This is due to the fact that she's having to care for grandparents because nobody else will. She has been taking care of them for five the last seven days since I've seen her they both have dementia his is from a stroke and her grandmother's was progressive. Nonetheless she states even her mom who knows her condition and situation has only help two of those days to take care of them she's been taking care of the rest. Fortunately there does not appear to be any signs of active infection in regard to her toe at this point although obviously it doesn't look as good as it did previous. I think this is directly related to her not taking off the pressure and friction by way of taking things easy. Though I completely understand what's going on. 01/29/19 on evaluation today patient's tools are actually appears to be showing some signs of improvement today compared to last week's evaluation as far as not necessarily the overall size of the wound but the fact that she has some new skin growth in between the two ends of the wound opening. Overall I feel like she has done well she states that she had a family member give her what sounds to be a CAM walker boot which has been helpful as well. 02/05/19 on evaluation today patient's wound bed actually appears to be doing significantly better in regard to her overall appearance of the size of the wound. With that being said she is still having an issue with offloading efficiently enough to get this to close. Apparently there is some signs of infection at this point as well unfortunately. Previously she's done well of Augmentin I really do not see anything that needs to be culture currently but there theme and cellulitis of the foot that I'm seeing I'm gonna go ahead and place her on an antibiotic today to try to help clear this up. 02/12/2019 on evaluation today patient actually appears to be doing poorly in regard to her overall wound  status. She tells me she has been using her offloading shoe but actually comes in today wearing her tennis shoe with the AFO brace. Again as I previously discussed with her this is really not sufficient to allow the area to heal appropriately. Nonetheless she continues to be somewhat noncompliant and I do wonder based on what she has told my nurse in the past as to whether or not a good portion of this noncompliance may be recreational drug and alcohol related. She has had a history of heroin overdose and this was fairly recently in the past couple of months that have been seeing her. Nonetheless overall I feel like her wound looks significantly worse today compared to what it was previous. She still has significant erythema despite the Augmentin I am not sure that this is an appropriate medication for her infection I am also concerned that the infection may have gone down into her bone. 02/19/19 on  evaluation today patient actually appears to be doing about the same in regard to her toe ulcer. Unfortunately she continues to show signs of bone exposure and infection at this point. There does not appear to be any evidence of worsening of the infection but I'm also not really sure that it's getting significantly better. She is on the Augmentin which should be sufficient for the Staphylococcus aureus infection that she has at this point. With that being said she may need IV antibiotics to more appropriately treat this. We did have a discussion today about hyperbaric option therapy. 02/28/19 on evaluation today patient actually appears to be doing much worse in regard to the wound on her left great toe as compared to even my previous evaluation last week. Unfortunately this seems to be training in a pretty poor direction. Her toe was actually now starting to angle laterally and I can actually see the entire joint area of the proximal portion of the digit where is the distal portion of the digit again is no  longer even in contact with the joint line. Unfortunately there's a lot more necrotic tissue around the edge and the toe appears to be showing signs of becoming gangrenous in my pinion. I'm very concerned about were things stand at this point. She did see infectious disease and they are planning to send in a prescription for Sivextro for her and apparently this has been approved. With that being said I don't think she should avoid taking this but at the same time I'm not sure that it's gonna be sufficient to save her toe at this point. 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 03/05/19 on evaluation today patient actually appears to be doing quite well in regard to her toe all things considering. She still has bone exposed but there appears to be much less your thing on overall the appearance of the wound and the toe itself is dramatically improved. She still does have some issues currently obviously with infection she did see vascular as well and there concerned that her blood flow to the toad. For that reason they are setting up for an angiogram next week. 03/14/19 on evaluation today patient appears to be doing very poor in regard to her toe and specifically in regard to the ulceration and the fact that she's starting to notice the toe was leaning even more towards the lateral aspect and the complete joint is visible on the proximal aspect of the joint. Nonetheless she's also noted a significant odor and the tip of the toe is turning more dark and necrotic appearing. Overall I think she is getting worse not better as far as this is concerned. For that reason I am recommending at this point that she likely needs to be seen for likely amputation. READMISSION 03/19/2021 This is a patient that we  cared for in this clinic for a prolonged period of time in 2019 and 2020 with a left foot and left first toe wound. 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 ([id number]) 04/09/20 by Dr. Luana Shu. In December 2021 she had an ulcer on her right great toe as well as the fourth and  fifth toes. 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 She also had an angiogram at that time and underwent angioplasty of the right anterior tibial artery. In any case she claims that the wound on the right foot is closed I did not look at this today which was probably an oversight although I think that should be done next week. After her surgery she developed a dehiscence but I do not see any follow-up of this. According to Dr. Deborra Medina last review that she was out of the area being cared for by another physician but recently came back to his attention. 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 A culture of this area showed E. coli apparently before she came back to see Dr. Luana Shu she was supposed to be receiving antibiotics but she did not really take them. Nor is she offloading this area at all. Finally her last hemoglobin A1c listed in epic was in March 2022 at 14.1 she says things are a lot better since then although I am not sure. 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 She had this wound at the time. During this admission she had E. coli sepsis a TEE was negative. 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 She is  still a heavy cigarette smoker. She has a history of bipolar disorder seizure disorder ABI in our clinic was 1.05 6/1; left midfoot in the setting of a TMA done previously. Round circular wound with a "knuckle" of protruding tissue. The problem is that the knuckle was not attached to any of the surrounding granulation and this probed proximally widely I removed a large portion of this tissue. This wound goes with considerable undermining laterally. I do not feel any bone there was no purulence but this is a deep wound. 6/8; in spite of the debridement I did last week. She arrives with a wound looking exactly the same. A protruding "knuckle" of tissue nonadherent to most of the surrounding tissue. There is considerable depth around this from 6-12 o'clock at 2.7 cm and undermining of 1 cm. This does not look overtly infected and the x-ray I did last week was negative for any osseous abnormalities. We have been using silver collagen 6/15; deep tissue culture I did last week showed moderate staph aureus and moderate Pseudomonas. This will definitely require prolonged antibiotic therapy. The pathology on the protuberant area was negative for malignancy fungus etc. 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 We have been using silver collagen. I am going to be prescribing Levaquin for 2 weeks. Her CT scan of the foot is down for 7/5 6/22; CT scan of the foot on 7 5. She says she has hardware in the left leg from her previous fracture. She is on the Levaquin for the deep tissue culture I did that showed methicillin sensitive staph aureus and Pseudomonas. I gave her a 2-week supply and she will have another week. She arrives in clinic today with the same protuberant tissue however this is nonadherent to the tissue surrounding it. I am really at a loss to explain this unless there is underlying deep tissue infection 6/29; patient presents for  1 week  follow-up. She has been using collagen to the wound bed. She reports taking her antibiotics as prescribed.She has no complaints or issues today. She denies signs of infection. 7/6; patient presents for one week followup. She has been using collagen to the wound bed. She states she is taking Levaquin however at times she is not able to keep it down. She denies signs of infection. 7/13; patient presents for 1 week follow-up. She has been using silver alginate to the wound bed. She still has nausea when taking her antibiotics. She denies signs of infection. 7/20; patient presents for 1 week follow-up. She has been using silver alginate with gentamicin cream to the wound bed. She denies any issues and has no complaints today. She denies signs of infection. 7/27; patient presents for 1 week follow-up. She continues to use silver alginate with gentamicin cream to the wound bed. She reports starting her antibiotics. She has no issues or complaints. Overall she reports stability to the wound. 8/3; patient presents for 1 week follow-up. She has been using silver alginate with gentamicin cream to the wound bed. She reports completing all antibiotics. She has no issues or complaints today. She denies signs of infection. 8/17; patient presents for 2-week follow-up. He is to use silver alginate to the wound bed. She has no issues or complaints today. She denies signs of infection. She reports her pain has improved in her foot since last clinic visit 8/24; patient presents for 1 week follow-up. She continues to use silver alginate to the wound bed. She has no issues or complaints. She denies signs of infection. Pain is stable. 9/7; patient presents for follow-up. She missed her last week appointment due to feeling ill. She continues to use silver alginate. She has a new wound to the right lower extremity that is covered in eschar. She states It occurred over the past week and has no idea how it started.  She currently denies signs of infection. 9/14; patient presents for follow-up. To the left foot wound she has been using gentamicin cream and silver alginate. To the right lower extremity wound she has been keeping this covered and has not obtain Santyl. 9/21; patient presents for follow-up. She reports using gentamicin cream and silver alginate to the left foot and Santyl to the right lower extremity wound. She has no issues or complaints today. She denies signs of infection. 9/28; patient presents for follow-up. She reports a new wound to her right heel. She states this occurred a few days ago and is progressively gotten worse. She has been trying to clean the area with a Q-tip and Santyl. She reports stability in the other 2 wounds. She has been using gentamicin cream and silver alginate to the left foot and Santyl to the right lower extremity wound. 10/12; patient presents for follow-up. She reports improvement to the wound beds. She is seeing vein and vascular to discuss the potential of a left BKA. She states they are going to do an arteriogram. She continues to use silver alginate with dressing changes to her wounds. 11/2; patient presents for follow-up. She states she has not been doing dressing changes to the wound beds. She states she is not able to offload the areas. She reports chronic pain to her left foot wound. 11/9; patient presents for follow-up. She came in with only socks on. She states she forgot to put on shoes. It is unclear if she is doing any dressing changes. She currently denies systemic signs of  infection. 11/16; patient presents for follow-up. She came again only with socks on. She states she does not wear shoes ever. It is unclear if she does dressing changes. She currently denies systemic signs of infection. 11/23; patient presents for follow-up. She wore her shoes today. It still unclear exactly what dressing she is using for each wound but she did states she obtained  Dakin's solution and has been using this to the left foot wound. She currently denies signs of infection. 11/30; patient presents for follow-up. She has no issues or complaints today. She currently denies signs of infection. 12/7; patient presents for follow-up. She has no issues or complaints today. She has been using Hydrofera Blue to the right heel wound and Dakin solution to the left foot wound. Her right anterior leg wound is healed. She currently denies signs of infection. SANDI, TOWE A. ([id number]) 12/14; patient presents for follow-up. She has been using Hydrofera Blue to the right heel and Dakin's to the left foot wounds. She has no issues or complaints today. She denies signs of infection. 12/21; patient presents for follow-up. She reports using Hydrofera Blue to the right heel and Dakin's to the left foot wound. She denies signs of infection. 12/28; patient presents for follow-up. She continues to use Dakin's to the left foot wound and Hydrofera Blue to the right heel wound. She denies signs of infection. 1/4; patient presents for follow-up. She has no issues or complaints today. She denies signs of infection. 1/11; patient presents for follow-up. It is unclear if she has been dressing these wounds over the past week. She currently denies signs of infection. 1/18; patient presents for follow-up. She states she has been using Dakin's wet-to-dry dressings to the left foot. She has been using Hydrofera Blue to the right foot foot wound. She states that the anterior right leg wound has reopened and draining serous fluid. She denies signs of infection. 1/25; patient presents for follow-up. She has no issues or complaints today. 2/1; patient presents for follow-up. She has no issues or complaints today. She denies signs of infection. 2/8; patient presents for follow-up. She has lost her surgical shoes. She did not have a dressing to the right heel wound. She currently denies signs of  infection. 2/15; patient presents for follow-up. She reports more pain to the right heel today. She denies purulent drainage Or fever/chills 2/22; patient presents for follow-up. She reports taking clindamycin over the past week. She states that she continues to have pain to her right heel. She reports purulent drainage. Readmission 03/16/2022 Ms. Sayge Brienza is a 46 year old female with a past medical history of type 2 diabetes, osteomyelitis to her feet, chronic systolic heart failure and bipolar disorder that presents to the clinic for bilateral feet wounds and right lower extremity wound. She was last seen in our clinic on 12/15/2021. At that time she had purulent drainage coming out of her right plantar foot and I recommended she go to the ED. She states she went to [facility] and has been there for the past 3 months. I cannot see the records. She states she had OR debridement and was on several weeks of IV antibiotics while inpatient. Since discharge she has not been taking care of the wound beds. She had nothing on her feet other than socks today. She currently denies signs of infection. 5/31; patient presents for follow-up. She has been using Dakin's wet-to-dry dressings to the wound beds on her feet bilaterally and antibiotic ointment to the  right anterior leg wound. She had a wound culture done at last clinic visit that showed moderate Pseudomonas aeruginosa sensitive to ciprofloxacin. She currently denies systemic signs of infection. 6/14; patient presents for follow-up. She received Keystone 5 days ago and has been using this on the wound beds. She states that last week she had to go to the hospital because she had increased warmth and erythema to the right foot. She was started on 2 oral antibiotics. She states she has been taking these. She currently denies systemic signs of infection. She has no issues or complaints today. 6/21; patient presents for follow-up. She states  she has been using Keystone antibiotics to the wound beds. She has no issues or complaints today. She denies signs of infection. 6/28; patient presents for follow-up. She has been using Keystone antibiotics to the wound beds. She has no issues or complaints today. 7/12; patient presents for follow-up. Has been using Keystone antibiotics to the wound beds with calcium alginate. She has no issues or complaints today. She never followed up with her orthopedic surgeon who did the OR debridement to the right foot. We discussed the total contact cast for the left foot and patient would like to do this next week. 7/19; patient presents for follow-up. She has been using Keystone antibiotics with calcium alginate to the wound beds. She has no issues or complaints today. Patient is in agreement to do the total contact cast of the left foot today. She knows to return later this week for the obligatory cast change. 05-13-2022 upon evaluation today patient's wound which she has the cast of the left leg actually appears to be doing significantly better. Fortunately I do not see any signs of active infection locally or systemically which is great news and overall I am extremely pleased with where we stand currently. 7/26; patient presents for follow-up. She has a cast in place for the past week. She states it irritated her shin. Other than that she tolerated the cast well. She states she would like a break for 1 week from the cast. We have been using Keystone antibiotic and Aquacel to both wound beds. She denies signs of infection. Electronic Signature(s) Signed: 05/18/2022 12:24:08 PM By: Kalman Shan DO Entered By: Kalman Shan on 05/18/2022 12:07:16 [name] ([id number]) -------------------------------------------------------------------------------- Physical Exam Details Patient Name: [name] Date of Service: 05/18/2022 11:15 AM Medical Record Number: [id number] Patient Account  Number: [id number] Date of Birth/Sex: 07-06-1976 (46 y.o. F) Treating RN: Cornell Barman Primary Care Provider: Raelene Bott Other Clinician: Massie Kluver Referring Provider: Raelene Bott Treating Provider/Extender: Yaakov Guthrie in Treatment: 9 Constitutional . Cardiovascular . Psychiatric . Notes Right foot: To the plantar heel there is an incision site with increased depth. Nonviable tissue at the surface, callus present. No probing to bone. Left foot: To the medial aspect there is an open wound with granulation tissue, nonviable tissue and circumferential callus. No surrounding signs of infection to any of the wound beds. Electronic Signature(s) Signed: 05/18/2022 12:24:08 PM By: Kalman Shan DO Entered By: Kalman Shan on 05/18/2022 12:13:36 [name] ([id number]) -------------------------------------------------------------------------------- Physician Orders Details Patient Name: [name] Date of Service: 05/18/2022 11:15 AM Medical Record Number: [id number] Patient Account Number: [id number] Date of Birth/Sex: 1976-06-18 (46 y.o. F) Treating RN: Cornell Barman Primary Care Provider: Raelene Bott Other Clinician: Massie Kluver Referring Provider: Raelene Bott Treating Provider/Extender: Yaakov Guthrie in Treatment: 9 Verbal / Phone Orders: No Diagnosis Coding Follow-up Appointments o Return Appointment  in 1 week. o Nurse Visit as needed [employer] o Wash wounds with antibacterial soap and water. o No tub bath. Anesthetic (Use 'Patient Medications' Section for Anesthetic Order Entry) o Lidocaine applied to wound bed Edema Control - Lymphedema / Segmental Compressive Device / Other o Elevate, Exercise Daily and Avoid Standing for Long Periods of Time. o Elevate legs to the level of the heart and pump ankles as often as possible o Elevate leg(s) parallel to the floor when sitting. Off-Loading o  Total Contact Cast to Left Lower Extremity - hold off on cast this week contact cast #2 applied Don't get cast wet o Other: - keep pressure off of feet. Medications-Please add to medication list. o Keystone Compound - Keystone gel Wound Treatment Wound #11 - Foot Wound Laterality: Plantar, Right Cleanser: Soap and Water 1 x Per Day/30 Days Discharge Instructions: Gently cleanse wound with antibacterial soap, rinse and pat dry prior to dressing wounds Topical: keystone gel 1 x Per Day/30 Days Primary Dressing: Aquacel Extra Hydrofiber Dressing, 4x5 (in/in) (Generic) 1 x Per Day/30 Days Discharge Instructions: packed into wound Secondary Dressing: ABD Pad 5x9 (in/in) (Generic) 1 x Per Day/30 Days Discharge Instructions: Cover with ABD pad Secured With: Medipore Tape - 67M Medipore H Soft Cloth Surgical Tape, 2x2 (in/yd) (Generic) 1 x Per Day/30 Days Secured With: Kerlix Roll Sterile or Non-Sterile 6-ply 4.5x4 (yd/yd) (Generic) 1 x Per Day/30 Days Discharge Instructions: Apply Kerlix as directed Wound #12 - Foot Wound Laterality: Plantar, Left, Medial Cleanser: Soap and Water 1 x Per Day/30 Days Discharge Instructions: Gently cleanse wound with antibacterial soap, rinse and pat dry prior to dressing wounds Topical: keystone gel 1 x Per Day/30 Days Discharge Instructions: apply very thin layer to wound Primary Dressing: Aquacel Extra Hydrofiber Dressing, 4x5 (in/in) (Generic) 1 x Per Day/30 Days Discharge Instructions: placed over keystone gel Secondary Dressing: ABD Pad 5x9 (in/in) (Generic) 1 x Per Day/30 Days Discharge Instructions: Cover with ABD pad Hakes, Nanea A. ([id number]) Secured With: Medipore Tape - 67M Medipore H Soft Cloth Surgical Tape, 2x2 (in/yd) (Generic) 1 x Per Day/30 Days Secured With: Kerlix Roll Sterile or Non-Sterile 6-ply 4.5x4 (yd/yd) (Generic) 1 x Per Day/30 Days Discharge Instructions: Apply Kerlix as directed Electronic Signature(s) Signed: 05/18/2022  12:24:08 PM By: Kalman Shan DO Entered By: Kalman Shan on 05/18/2022 12:23:47 [name] ([id number]) -------------------------------------------------------------------------------- Problem List Details Patient Name: [name] Date of Service: 05/18/2022 11:15 AM Medical Record Number: [id number] Patient Account Number: [id number] Date of Birth/Sex: 28-May-1976 (46 y.o. 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 Date of Service: 05/18/2022 11:15 AM Medical Record Number: [id number] Patient Account Number: [id number] Date of Birth/Sex: 27-May-1976 (45  y.o. F) Treating RN: Cornell Barman Primary Care Provider: Raelene Bott Other Clinician: Massie Kluver Referring Provider: Raelene Bott Treating Provider/Extender: Yaakov Guthrie in Treatment: 9 Subjective Chief Complaint Information obtained from Patient 03/19/2021; patient referred  by Dr. Luana Shu who has been looking after her left foot for quite a period of time for review of a nonhealing area in the left midfoot 03/12/2022; bilateral feet wounds and right lower extremity wound. History of Present Illness (HPI) 01/18/18-She is here for initial evaluation of the left great toe ulcer. She is a poor historian in regards to timeframe in detail. She states approximately 4 weeks ago she lacerated her toe on something in the house. She followed up with her primary care who placed her on Bactrim and ultimately a second dose of Bactrim prior to coming to wound clinic. She states she has been treating the toe with peroxide, Betadine and a Band-Aid. She did not check her blood sugar this morning but checked it yesterday morning it was 327; she is unaware of a recent A1c and there are no current records. She saw Dr. she would've orthopedics last week for an old injury to the left ankle, she states he did not see her toe, nor did she bring it to his attention. She smokes approximately 1 pack cigarettes a day. Her social situation is concerning, she arrives this morning with her mother who appears extremely intoxicated/under the influence; her mother was asked to leave the room and be monitored by the patient's grandmother. The patient's aunt then accompanied the patient and the room throughout the rest of the appointment. We had a lengthy discussion regarding the deleterious effects of uncontrolled hyperglycemia and smoking as it relates to wound healing and overall health. She was strongly encouraged to decrease her smoking and get her diabetes under better control. She states she is currently on a diet and has cut down her [facility] consumption. The left toe is erythematous, macerated and slightly edematous with malodor present. The edema in her left foot is below her baseline, there is no erythema streaking. We will treat her with Santyl, doxycycline; we have ordered and xray, culture and  provided a Peg assist surgical shoe and cultured the wound. 01/25/18-She is here in follow-up evaluation for a left great toe ulcer and presents with an abscess to her suprapubic area. She states her blood sugars remain elevated, feeling "sick" and if levels are below 250, but she is trying. She has made no attempt to decrease her smoking stating that we "can't take away her food in her cigarettes". She has been compliant with offloading using the PEG assist you. She is using Santyl daily. the culture obtained last week grew staph aureus and Enterococcus faecalis; continues on the doxycycline and Augmentin was added on Monday. The suprapubic area has erythema, no femoral variation, purple discoloration, minimal induration, was accessed with a cotton tip applicator with sanguinopurulent drainage, this was cultured, I suspect the current antibiotic treatment will cover and we will not add anything to her current treatment plan. She was advised to go to urgent care or ER with any change in redness, induration or fever. 02/01/18-She is here in follow-up evaluation for left great toe ulcers and a new abdominal abscess from last week. She was able to use packing until earlier this week, where she "forgot it was there". She states she was feeling ill with GI symptoms last week and was not taking her antibiotic. She states her glucose levels have been predominantly  less than 200, with occasional levels between 200-250. She thinks this was contributing to her GI symptoms as they have resolved without intervention. There continues to be significant laceration to left toe, otherwise it clinically looks stable/improved. There is now less superficial opening to the lateral aspect of the great toe that was residual blister. We will transition to [facility] to all wounds, she will continue her Augmentin. If there is no change or deterioration next week for reculture. 02/08/18-She is here in follow-up evaluation for  left great toe ulcer and abdominal ulcer. There is an improvement in both wounds. She has been wrapping her left toe with coban, not by our direction, which has created an area of discoloration to the medial aspect; she has been advised to NOT use coban secondary to her neuropathy. She states her glucose levels have been high over this last week ranging from 200-350, she continues to smoke. She admits to being less compliant with her offloading shoe. We will continue with same treatment plan and she will follow-up next week. 02/15/18-She is here in follow-up evaluation for left great toe ulcer and abdominal ulcer. The abdominal ulcer is epithelialized. The left great toe ulcer is improved and all injury from last week using the Coban wrap is resolved, the lateral ulcer is healed. She admits to noncompliance with wearing offloading shoe and admits to glucose levels being greater than 300 most of the week. She continues to smoke and expresses no desire to quit. There is one area medially that probes deeper than it has historically, erythema to the toe and dorsal foot has consistently waxed and waned. There is no overt signs of cellulitis or infection but we will culture the wound for any occult infection given the new area of depth and erythema. We will hold off on sensitivities for initiation of antibiotic therapy. 02/22/18-She is here in follow up evaluation for left great toe ulcer. There is overall significant improvement in both wound appearance, erythema and edema with changes made last week. She was not initiated on antibiotic therapy. Culture obtained last week showed oxacillin sensitive staph aureus, sensitive to clindamycin. Clindamycin has been called into the pharmacy but she has been instructed to hold off on initiation secondary to overall clinical improvement and her history of antibiotic intolerance. She has been instructed to contact the clinic with any noted changes/deterioration and the  wound, erythema, edema and/or pain. She will follow-up next week. She continues to smoke and her glucose levels remain elevated >250; she admits to compliance with offloading shoe 03/01/18 on evaluation today patient appears to be doing fairly well in regard to her left first toe ulcer. She has been tolerating the dressing changes with the [facility] Dressing without complication and overall this has definitely showed signs of improvement according to records as well is what the patient tells me today. I'm very pleased in that regard. She is having no pain today 03/08/18 She is here for follow up evaluation of a left great toe ulcer. She remains non-compliant with glucose control and smoking cessation; glucose levels consistently >200. She states that she got new shoe inserts/peg assist. She admits to compliance with offloading. Since my last evaluation there is significant improvement. We will switch to prisma at this time and she will follow up next week. She is noted to be tachycardic at this appointment, heart rate 120s; she has a history of heart rate 70-130 according to our records. She admits to extreme agitation r/t personal issues; she was  advised to monitor her heartrate and contact her physician if it does not return to a more normal range (<100). She takes cardizem twice daily. 03/15/18-She is here in follow-up evaluation for left great toe ulcer. She remains noncompliant with glucose control and smoking cessation. She admits to compliance with wearing offloading shoe. The ulcer is improved/stable and we will continue with the same treatment plan and she will follow-up next week 03/22/18-She is here for evaluation for left great toe ulcer. There continues to be significant improvement despite recurrent hyperglycemia (over 500 yesterday) and she continues to smoke. She has been compliant with offloading and we will continue with same treatment plan and she will follow-up next week. Alberty,  Memory A. ([id number]) 03/29/18-She is here for evaluation for left great toe ulcer. Despite continuing to smoke and uncontrolled diabetes she continues to improve. She is compliant with offloading shoe. We will continue with the same treatment plan and she will follow-up next week 04/05/18- She is here in follow up evaluation for a left great toe ulcer; she presents with small pustule to left fifth toe (resembles ant bite). She admits to compliance with wearing offloading shoe; continues to smoke or have uncontrolled blood glucose control. There is more callus than usual with evidence of bleeding; she denies known trauma. 04/12/18-She is here for evaluation of left great toe ulcer. Despite noncompliance with glycemic control and smoking she continues to make improvement. She continues to wear offloading shoe. The pustule, that was identified last week, to the left fifth toe is resolved. She will follow-up in 2 weeks 05/03/18-she is seen in follow-up evaluation for a left great toe ulcer. She is compliant with offloading, otherwise noncompliant with glycemic control and smoking. She has plateaued and there is minimal improvement noted. We will transition to [facility], replaced the insert to her surgical shoe and she will follow-up in one week 05/10/18- She is here in follow up evaluation for a left great toe ulcer. It appears stable despite measurement change. We will continue with same treatment plan and follow up next week. 05/24/18-She is seen in follow-up evaluation for a left great toe ulcer. She remains compliant with offloading, has made significant improvement in her diet, decreasing the amount of sugar/soda. She said her recent A1c was 10.9 which is lower than. She did see a diabetic nutritionist/educator yesterday. She continues to smoke. We will continue with the same treatment plan and she'll follow-up next week. 05/31/18- She is seen in follow-up evaluation for left great toe ulcer. She  continues to remain compliant with offloading, continues to make improvement in her diet, increasing her water and decreasing the amount of sugar/soda. She does continue to smoke with no desire to quit. We will apply Prisma to the depth and Hydrofera Blue over. We have not received insurance authorization for oasis. She will follow up next week. 06/07/18-She is seen in follow-up evaluation for left great toe ulcer. It has stalled according to today's measurements although base appears stable. She says she saw a diabetic educator yesterday; her average blood sugars are less than 300 which is an improvement for her. She continues to smoke and states "that's my next step" She continues with water over soda. We will order for xray, culture and reinstate ace wrap compression prior to placing apligraf for next week. She is voicing no complaints or concerns. Her dressing will change to iodoflex over the next week in preparation for apligraf. 06/14/18-She is seen in follow-up evaluation for left great toe ulcer.  Plain film x-ray performed last week was negative for osteomyelitis. Wound culture obtained last week grew strep B and OSSA; she is initiated on keflex and cefdinir today; there is erythema to the toe which could be from ace wrap compression, she has a history of wrapping too tight and has has been encouraged to maintain ace wraps that we place today. We will hold off on application of apligraf today, will apply next week after antibiotic therapy has been initiated. She admits today that she has resumed taking a shower with her foot/toe submerged in water, she has been reminded to keep foot/toe out of the bath water. She will be seen in follow up next week 06/21/18-she is seen in follow-up evaluation for left great toe ulcer. She is tolerating antibiotic therapy with no GI disturbance. The wound is stable. Apligraf was applied today. She has been decreasing her smoking, only had 4 cigarettes yesterday  and 1 today. She continues being more compliant in diabetic diet. She will follow-up next week for evaluation of site, if stable will remove at 2 weeks. 06/28/18- She is here in follow up evalution. Apligraf was placed last week, she states the dressing fell off on Tuesday and she was dressing with hydrofera blue. She is healed and will be discharged from the clinic today. She has been instructed to continue with smoking cessation, continue monitoring glucose levels, offloading for an additional 4 weeks and continue with hydrofera blue for additional two weeks for any possible microscopic opening. Readmission: 08/07/18 on evaluation today patient presents for reevaluation concerning the ulcer of her right great toe. She was previously discharged on 06/28/18 healed. Nonetheless she states that this began to show signs of drainage she subsequently went to her primary care provider. Subsequently an x-ray was performed on 08/01/18 which was negative. The patient was also placed on antibiotics at that time. Fortunately they should have been effective for the infection. Nonetheless she's been experiencing some improvement but still has a lot of drainage coming from the wound itself. 08/14/18 on evaluation today patient's wound actually does show signs of improvement in regard to the erythema at this point. She has completed the antibiotics. With that being said we did discuss the possibility of placing her in a total contact cast as of today although I think that I may want to give this just a little bit more time to ensure nothing recurrence as far as her infection is concerned. I do not want to put in the cast and risk infection at that time if things are not completely resolved. With that being said she is gonna require some debridement today. 08/21/18 on evaluation today patient actually appears to be doing okay in regard to her toe ulcer. She's been tolerating the dressing changes without complication.  With that being said it does appear that she is ready and in fact I think it's appropriate for [place] to go ahead and initiate the total contact cast today. Nonetheless she will require some sharp debridement to prepare the wound for application. Overall I feel like things have been progressing well but we do need to do something to get this to close more readily. 08/24/18 patient seen today for reevaluation after having had the total contact cast applied on Tuesday. She seems to have done very well the wound appears to be doing great and overall I'm pleased with the progress that she's made. There were no abnormal areas of rubbing from the cast on her lower extremity. 08/30/18 on evaluation  today patient actually appears to be completely healed in regard to her plantar toe ulcer. She tells me at this point she's been having a lot of issues with the cast. She almost fell a couple of times the state shall the step of her dog a couple times as well. This is been a very frustrating process for her other nonetheless she has completely healed the wound which is excellent news. Overall there does not appear to be the evidence of infection at this time which is great news. 09/11/18 evaluation today patient presents for follow-up concerning her great toe ulcer on the left which has unfortunately reopened since I last saw her which was only a couple of weeks ago. Unfortunately she was not able to get in to get the shoe and potentially the AFO that's gonna be necessary due to her left foot drop. She continues with offloading shoe but this is not enough to prevent her from reopening it appears. When we last had her in the total contact cast she did well from a healing standpoint but unfortunately the wound reopened as soon as she came out of the cast within just a couple of weeks. Right now the biggest concern is that I do believe the foot drop is leading to the issue and this is gonna continue to be an issue  unfortunately until we get things under control as far as the walking anomaly is concerned with the foot drop. This is also part of the reason why she falls on a regular basis. I just do not believe that is gonna be safe for [place] to reinitiate the total contact cast as last time we had this on she fell 3 times one week which is definitely not normal for her. 09/18/18 upon evaluation today the patient actually appears to be doing about the same in regard to her toe ulcer. She did not contact [facility] as I asked her to even though I had given her the prescription. In fact she actually states that she has no idea where the prescription is. She did apparently call [facility] and they told her that all she needed to do was bring the prescription in order to be able to be seen and work on getting the AFO for her left foot. With all that being said she still does not have an appointment and I'm not sure were things stand that regard. I will give her a new prescription today in order to contact them to get this set up. AVILYN, VIRTUE A. ([id number]) 09/25/18 on evaluation today patient actually appears to be doing about the same in regard to her toes ulcer. She does have a small areas which seems to have a lot of callous buildup around the edge of the wound which is going to need sharp debridement today. She still is waiting to be scheduled for evaluation with [facility] for possibility of an AFO. She states there supposed to call her tomorrow to get this set up. Unfortunately it does appear that her foot specifically the toe area is showing signs of erythema. There does not appear to be any systemic infection which is in these good news. 10/02/18 on evaluation today patient actually appears to be doing about the same in regard to her toe ulcer. This really has not done too well although it's not significantly larger it's also not significantly smaller. She has been tolerating the dressing changes without complication.  She actually has her appointment with [facility] and [facility] tomorrow to hopefully be  measured for obtaining and AFO splint. I think this would be helpful preventing this from reoccurring. We had contemplated starting the cast this week although to be honest I am reluctant to do that as she's been having nausea, vomiting, and seizure activity over the past three days. She has a history of seizures and have been told is nothing that can be done for these. With that being said I do believe that along with the seizures have the nausea vomiting which upon further questioning doesn't seem to be the normal for her and makes me concerned for the possibility of infection or something else going on. I discussed this with the patient and her mother during the office visit today. I do not feel the wound is effective but maybe something else. The responses this was "this just happens to her at times and we don't know why". They did not seem to be interested in going to the hospital to have this checked out further. 10/09/18 on evaluation today patient presents for follow-up concerning her ongoing toe ulcer. She has been tolerating the dressing changes without complication. Fortunately there does not appear to be any evidence of infection which is great news however I do think that the patient would benefit from going ahead for with the total contact cast. She's actually in a wheelchair today she tells me that she will use her walker if we initiate the cast. I was very specific about the fact that if we were gonna do the cast I wanted to make sure that she was using the walker in order to prevent any falls. She tells me she does not have stairs that she has to traverse on a regular basis at her home. She has not had any seizures since last week again that something that happens to her often she tells me she did talk to [employer] and they said that it may take up to three weeks to get the brace approved for her.  Hopefully that will not take that long but nonetheless in the meantime I do think the cast could be of benefit. 10/12/18 on evaluation today patient appears to be doing rather well in regard to her toe ulcer. It's just been a few days and already this is significantly improved both as far as overall appearance and size. Fortunately there's no sign of infection. She is here for her first obligatory cast change. 10/19/18 Seen today for follow up and management of left great toe ulcer. Wound continues to show improvement. Noted small open area with seroussang drainage with palpation. Denies any increased pain or recent fevers during visit. She will continue calcium alginate with offloading shoe. Denies any questions or concerns during visit. 10/26/18 on evaluation today patient appears to be doing about the same as when I last saw her in regard to her wound bed. Fortunately there does not appear to be any signs of infection. Unfortunately she continues to have a breakdown in regard to the toe region any time that she is not in the cast. It takes almost no time at all for this to happen. Nonetheless she still has not heard anything from the brace being made by [employer] as to when exactly this will be available to her. Fortunately there is no signs of infection at this time. 10/30/18 on evaluation today patient presents for application of the total contact cast as we just received him this morning. Fortunately we are gonna be able to apply this to her today which is great  news. She continues to have no significant pain which is good news. Overall I do feel like things have been improving while she was the cast is when she doesn't have a cast that things get worse. She still has not really heard anything from [facility] regarding her brace. 11/02/18 upon evaluation today patient's wound already appears to be doing significantly better which is good news. Fortunately there does not appear to be any signs of  infection also good news. Overall I do think the total contact cast as before is helping to heal this area unfortunately it's just not gonna likely keep the area closed and healed without her getting her brace at least. Again the foot drop is a significant issue for her. 11/09/18 on evaluation today patient appears to be doing excellent in regard to her toe ulcer which in fact is completely healed. Fortunately we finally got the situation squared away with the paperwork which was needed to proceed with getting her brace approved by Medicaid. I have filled that out unfortunately that information has been sent to the orthopedic office that I worked at 2 1/2 years ago and not tired Current wound care measures. Fortunately she seems to be doing very well at this time. 11/23/18 on evaluation today patient appears to be doing More Poorly Compared to Last Time I Saw Her. At [facility] She Had Completely Healed. Currently she is continuing to have issues with reopening. She states that she just found out that the brace was approved through Medicaid now she just has to go get measured in order to have this fitted for her and then made. Subsequently she does not have an appointment for this yet that is going to complicate things we obviously cannot put her back in the cast if we do not have everything measured because they're not gonna be able to measure her foot while she is in the cast. 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 Obviously this is unfortunate and does have me somewhat worried as well. 11/30/18 on evaluation today patient's toe ulcer actually appears to be doing fairly well. The good news is she will be getting her brace in the shoes next week on Wednesday. Hopefully we will be able to get this to heal without having to go back in the cast however she may need the cast in order to get the wound  completely heal and then go from there. Fortunately there's no signs of infection at this time. 12/07/18 on evaluation today patient fortunately did receive her brace and she states she could tell this definitely makes her walk better. With that being said she's been having issues with her toe where she noticed yesterday there was a lot of tissue that was loosing off this appears to be much larger than what it was previous. She also states that her leg has been read putting much across the top of her foot just about the ankle although this seems to be receiving somewhat. The total area is still red and appears to be someone infected as best I can tell. She is previously taken Bactrim and that may be a good option for her today as well. We are gonna see what I wound culture shows as well and I think that this is definitely appropriate. With that being said outside of the culture I still need to initiate something in the interim  and that's what I'm gonna go ahead and select Bactrim is a good option for her. 12/14/18 on evaluation today patient appears to be doing better in regard to her left great toe ulcer as compared to last week's evaluation. There's still some erythema although this is significantly improved which is excellent news. Overall I do believe that she is making good progress is still gonna take some time before she is where I would like her to be from the standpoint of being able to place her back into the total contact cast. Hopefully we will be where we need to be by next week. 12/21/18 on evaluation today patient actually appears to be doing poorly in regard to her toe ulcer. She's been tolerating the dressing changes without complication. Fortunately there's no signs of systemic infection although she does have a lot of drainage from the toe ulcer and this does Cumming, Nevah A. ([id number]) seem to be causing some issues at this point. She does have erythema on the distal portion of her  toe that appears to be likely cellulitis. 12/28/18 on evaluation today patient actually appears to be doing a little better in my pinion in regard to her toe ulcer. With that being said she still does have some evidence of infection at this time and for her culture she had both E. coli as well as enterococcus as organisms noted on evaluation. For that reason I think that though the Keflex likely has treated the E. coli rather well this has really done nothing for the enterococcus. We are going to have to initiate treatment for this specifically. 01/04/19 on evaluation today patient's toe actually appears to be doing better from the standpoint of infection. She currently would like to see about putting the cash back on I think that this is appropriate as long as she takes care of it and keeps it from getting wet. She is gonna have some drainage we can definitely pass this up with Drawtex and alginate to try to prevent as much drainage as possible from causing the problems. With that being said I do want to at least try her with the cast between now and Tuesday. If there any issues we can't continue to use it then I will discontinue the use of the cast at that point. 01/08/19 on evaluation today patient actually appears to be doing very well as far as her foot ulcer specifically the great toe on the left is concerned. She did have an area of rubbing on the medial aspect of her left ankle which again is from the cast. Fortunately there's no signs of infection at this point in this appears to be a very slight skin breakdown. The patient tells me she felt it rubbing but didn't think it was that bad. Fortunately there is no signs of active infection at this time which is good news. No fevers, chills, nausea, or vomiting noted at this time. 01/15/19 on evaluation today patient actually appears to be doing well in regard to her toe ulcer. Again as previous she seems to do well and she has the cast on which  indicates to me that during the time she doesn't have a cast on she's putting way too much pressure on this region. Obviously I think that's gonna be an issue as with the current national emergency concerning the Covid-19 Virus it has been recommended that we discontinue the use of total contact casting by the chief medical officer of our company, Dr. Simona Huh. The reasoning is  that if a patient becomes sick and cannot come into have the cast removed they could not just leave this on for an additional two weeks. Obviously the hospitals also do not want to receive patient's who are sick into the emergency department to potentially contaminate the region and spread the Covid-19 Virus among other sick individuals within the hospital system. Therefore at this point we are suspending the use of total contact cast until the current emergency subsides. This was all discussed with the patient today as well. 01/22/19 on evaluation today patient's wound on her left great toe appears to be doing slightly worse than previously noted last week. She tells me that she has been on this quite a bit in fact she tells me she's been awake for 38 straight hours. This is due to the fact that she's having to care for grandparents because nobody else will. She has been taking care of them for five the last seven days since I've seen her they both have dementia his is from a stroke and her grandmother's was progressive. Nonetheless she states even her mom who knows her condition and situation has only help two of those days to take care of them she's been taking care of the rest. Fortunately there does not appear to be any signs of active infection in regard to her toe at this point although obviously it doesn't look as good as it did previous. I think this is directly related to her not taking off the pressure and friction by way of taking things easy. Though I completely understand what's going on. 01/29/19 on evaluation today  patient's tools are actually appears to be showing some signs of improvement today compared to last week's evaluation as far as not necessarily the overall size of the wound but the fact that she has some new skin growth in between the two ends of the wound opening. Overall I feel like she has done well she states that she had a family member give her what sounds to be a CAM walker boot which has been helpful as well. 02/05/19 on evaluation today patient's wound bed actually appears to be doing significantly better in regard to her overall appearance of the size of the wound. With that being said she is still having an issue with offloading efficiently enough to get this to close. Apparently there is some signs of infection at this point as well unfortunately. Previously she's done well of Augmentin I really do not see anything that needs to be culture currently but there theme and cellulitis of the foot that I'm seeing I'm gonna go ahead and place her on an antibiotic today to try to help clear this up. 02/12/2019 on evaluation today patient actually appears to be doing poorly in regard to her overall wound status. She tells me she has been using her offloading shoe but actually comes in today wearing her tennis shoe with the AFO brace. Again as I previously discussed with her this is really not sufficient to allow the area to heal appropriately. Nonetheless she continues to be somewhat noncompliant and I do wonder based on what she has told my nurse in the past as to whether or not a good portion of this noncompliance may be recreational drug and alcohol related. She has had a history of heroin overdose and this was fairly recently in the past couple of months that have been seeing her. Nonetheless overall I feel like her wound looks significantly worse today compared to  what it was previous. She still has significant erythema despite the Augmentin I am not sure that this is an appropriate medication  for her infection I am also concerned that the infection may have gone down into her bone. 02/19/19 on evaluation today patient actually appears to be doing about the same in regard to her toe ulcer. Unfortunately she continues to show signs of bone exposure and infection at this point. There does not appear to be any evidence of worsening of the infection but I'm also not really sure that it's getting significantly better. She is on the Augmentin which should be sufficient for the Staphylococcus aureus infection that she has at this point. With that being said she may need IV antibiotics to more appropriately treat this. We did have a discussion today about hyperbaric option therapy. 02/28/19 on evaluation today patient actually appears to be doing much worse in regard to the wound on her left great toe as compared to even my previous evaluation last week. Unfortunately this seems to be training in a pretty poor direction. Her toe was actually now starting to angle laterally and I can actually see the entire joint area of the proximal portion of the digit where is the distal portion of the digit again is no longer even in contact with the joint line. Unfortunately there's a lot more necrotic tissue around the edge and the toe appears to be showing signs of becoming gangrenous in my pinion. I'm very concerned about were things stand at this point. She did see infectious disease and they are planning to send in a prescription for Sivextro for her and apparently this has been approved. With that being said I don't think she should avoid taking this but at the same time I'm not sure that it's gonna be sufficient to save her toe at this point. 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 03/05/19 on  evaluation today patient actually appears to be doing quite well in regard to her toe all things considering. She still has bone exposed but there appears to be much less your thing on overall the appearance of the wound and the toe itself is dramatically improved. She still does have some issues currently obviously with infection she did see vascular as well and there concerned that her blood flow to the toad. For that reason they are setting up for an angiogram next week. 03/14/19 on evaluation today patient appears to be doing very poor in regard to her toe and specifically in regard to the ulceration and the fact that she's starting to notice the toe was leaning even more towards the lateral aspect and the complete joint is visible on the proximal aspect of the joint. Nonetheless she's also noted a significant odor and the tip of the toe is turning more dark and necrotic appearing. Overall I think she is getting worse not better as far as this is concerned. For that reason I am recommending at this point that she likely needs to be seen for likely amputation. MACKENIZE, DELGADILLO ([id number]) READMISSION 03/19/2021 This is a patient that we cared for in this clinic for a prolonged period of time in 2019 and 2020 with a left foot and left first toe wound. I believe she ultimately became infected  and underwent a left first toe amputation. 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 Luana Shu. In December 2021 she had an ulcer on her right great toe as well as the fourth and fifth toes. 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 She also had an angiogram at that time and underwent angioplasty of the right anterior tibial artery. In any case she claims that the wound on the right foot is closed I did not look at this today which was probably an oversight although I think that should be done next week. After her surgery she developed a  dehiscence but I do not see any follow-up of this. According to Dr. Deborra Medina last review that she was out of the area being cared for by another physician but recently came back to his attention. 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 A culture of this area showed E. coli apparently before she came back to see Dr. Luana Shu she was supposed to be receiving antibiotics but she did not really take them. Nor is she offloading this area at all. Finally her last hemoglobin A1c listed in epic was in March 2022 at 14.1 she says things are a lot better since then although I am not sure. 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 She had this wound at the time. During this admission she had E. coli sepsis a TEE was negative. 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 She is still a heavy cigarette smoker. She has a history of bipolar disorder seizure disorder ABI in our clinic was 1.05 6/1; left midfoot in the setting of a TMA done previously. Round circular wound with a "knuckle" of protruding tissue. The problem is that the knuckle was not attached to any of the surrounding granulation and this probed proximally widely I removed a large portion of this tissue. This wound goes with considerable undermining laterally. I do not feel any bone there was no purulence but this is a deep wound. 6/8; in spite of the debridement I did last week. She arrives with a wound looking exactly the same. A protruding "knuckle" of tissue nonadherent to most of the surrounding tissue. There is considerable depth around this from 6-12 o'clock at 2.7 cm and undermining of 1 cm.  This does not look overtly infected and the x-ray I did last week was negative for any osseous abnormalities. We have been using silver collagen 6/15; deep tissue culture I did last week showed moderate staph aureus and moderate Pseudomonas. This will definitely require prolonged antibiotic therapy. The pathology on the protuberant area was negative for malignancy fungus etc. 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 We have been using silver collagen. I am going to be prescribing Levaquin for 2 weeks. Her CT scan of the foot is down for 7/5 6/22; CT scan of the foot on 7 5. She says she has hardware in the left leg from her previous fracture. She is on the Levaquin for the deep tissue culture I did that showed methicillin sensitive staph aureus and Pseudomonas. I gave her a 2-week supply and she will  have another week. She arrives in clinic today with the same protuberant tissue however this is nonadherent to the tissue surrounding it. I am really at a loss to explain this unless there is underlying deep tissue infection 6/29; patient presents for 1 week follow-up. She has been using collagen to the wound bed. She reports taking her antibiotics as prescribed.She has no complaints or issues today. She denies signs of infection. 7/6; patient presents for one week followup. She has been using collagen to the wound bed. She states she is taking Levaquin however at times she is not able to keep it down. She denies signs of infection. 7/13; patient presents for 1 week follow-up. She has been using silver alginate to the wound bed. She still has nausea when taking her antibiotics. She denies signs of infection. 7/20; patient presents for 1 week follow-up. She has been using silver alginate with gentamicin cream to the wound bed. She denies any issues and has no complaints today. She denies signs of infection. 7/27; patient presents for 1 week  follow-up. She continues to use silver alginate with gentamicin cream to the wound bed. She reports starting her antibiotics. She has no issues or complaints. Overall she reports stability to the wound. 8/3; patient presents for 1 week follow-up. She has been using silver alginate with gentamicin cream to the wound bed. She reports completing all antibiotics. She has no issues or complaints today. She denies signs of infection. 8/17; patient presents for 2-week follow-up. He is to use silver alginate to the wound bed. She has no issues or complaints today. She denies signs of infection. She reports her pain has improved in her foot since last clinic visit 8/24; patient presents for 1 week follow-up. She continues to use silver alginate to the wound bed. She has no issues or complaints. She denies signs of infection. Pain is stable. 9/7; patient presents for follow-up. She missed her last week appointment due to feeling ill. She continues to use silver alginate. She has a new wound to the right lower extremity that is covered in eschar. She states It occurred over the past week and has no idea how it started. She currently denies signs of infection. 9/14; patient presents for follow-up. To the left foot wound she has been using gentamicin cream and silver alginate. To the right lower extremity wound she has been keeping this covered and has not obtain Santyl. 9/21; patient presents for follow-up. She reports using gentamicin cream and silver alginate to the left foot and Santyl to the right lower extremity wound. She has no issues or complaints today. She denies signs of infection. 9/28; patient presents for follow-up. She reports a new wound to her right heel. She states this occurred a few days ago and is progressively gotten worse. She has been trying to clean the area with a Q-tip and Santyl. She reports stability in the other 2 wounds. She has been using gentamicin cream and silver alginate to  the left foot and Santyl to the right lower extremity wound. 10/12; patient presents for follow-up. She reports improvement to the wound beds. She is seeing vein and vascular to discuss the potential of a left BKA. She states they are going to do an arteriogram. She continues to use silver alginate with dressing changes to her wounds. 11/2; patient presents for follow-up. She states she has not been doing dressing changes to the wound beds. She states she is not able to offload the areas. She reports chronic  pain to her left foot wound. 11/9; patient presents for follow-up. She came in with only socks on. She states she forgot to put on shoes. It is unclear if she is doing any dressing changes. She currently denies systemic signs of infection. 11/16; patient presents for follow-up. She came again only with socks on. She states she does not wear shoes ever. It is unclear if she does dressing changes. She currently denies systemic signs of infection. 11/23; patient presents for follow-up. She wore her shoes today. It still unclear exactly what dressing she is using for each wound but she did states she obtained Dakin's solution and has been using this to the left foot wound. She currently denies signs of infection. SIERA, BEYERSDORF ([id number]) 11/30; patient presents for follow-up. She has no issues or complaints today. She currently denies signs of infection. 12/7; patient presents for follow-up. She has no issues or complaints today. She has been using Hydrofera Blue to the right heel wound and Dakin solution to the left foot wound. Her right anterior leg wound is healed. She currently denies signs of infection. 12/14; patient presents for follow-up. She has been using Hydrofera Blue to the right heel and Dakin's to the left foot wounds. She has no issues or complaints today. She denies signs of infection. 12/21; patient presents for follow-up. She reports using Hydrofera Blue to the right heel and  Dakin's to the left foot wound. She denies signs of infection. 12/28; patient presents for follow-up. She continues to use Dakin's to the left foot wound and Hydrofera Blue to the right heel wound. She denies signs of infection. 1/4; patient presents for follow-up. She has no issues or complaints today. She denies signs of infection. 1/11; patient presents for follow-up. It is unclear if she has been dressing these wounds over the past week. She currently denies signs of infection. 1/18; patient presents for follow-up. She states she has been using Dakin's wet-to-dry dressings to the left foot. She has been using Hydrofera Blue to the right foot foot wound. She states that the anterior right leg wound has reopened and draining serous fluid. She denies signs of infection. 1/25; patient presents for follow-up. She has no issues or complaints today. 2/1; patient presents for follow-up. She has no issues or complaints today. She denies signs of infection. 2/8; patient presents for follow-up. She has lost her surgical shoes. She did not have a dressing to the right heel wound. She currently denies signs of infection. 2/15; patient presents for follow-up. She reports more pain to the right heel today. She denies purulent drainage Or fever/chills 2/22; patient presents for follow-up. She reports taking clindamycin over the past week. She states that she continues to have pain to her right heel. She reports purulent drainage. Readmission 03/16/2022 Ms. Evian Derringer is a 46 year old female with a past medical history of type 2 diabetes, osteomyelitis to her feet, chronic systolic heart failure and bipolar disorder that presents to the clinic for bilateral feet wounds and right lower extremity wound. She was last seen in our clinic on 12/15/2021. At that time she had purulent drainage coming out of her right plantar foot and I recommended she go to the ED. She states she went to [facility] and  has been there for the past 3 months. I cannot see the records. She states she had OR debridement and was on several weeks of IV antibiotics while inpatient. Since discharge she has not been taking care of the wound  beds. She had nothing on her feet other than socks today. She currently denies signs of infection. 5/31; patient presents for follow-up. She has been using Dakin's wet-to-dry dressings to the wound beds on her feet bilaterally and antibiotic ointment to the right anterior leg wound. She had a wound culture done at last clinic visit that showed moderate Pseudomonas aeruginosa sensitive to ciprofloxacin. She currently denies systemic signs of infection. 6/14; patient presents for follow-up. She received Keystone 5 days ago and has been using this on the wound beds. She states that last week she had to go to the hospital because she had increased warmth and erythema to the right foot. She was started on 2 oral antibiotics. She states she has been taking these. She currently denies systemic signs of infection. She has no issues or complaints today. 6/21; patient presents for follow-up. She states she has been using Keystone antibiotics to the wound beds. She has no issues or complaints today. She denies signs of infection. 6/28; patient presents for follow-up. She has been using Keystone antibiotics to the wound beds. She has no issues or complaints today. 7/12; patient presents for follow-up. Has been using Keystone antibiotics to the wound beds with calcium alginate. She has no issues or complaints today. She never followed up with her orthopedic surgeon who did the OR debridement to the right foot. We discussed the total contact cast for the left foot and patient would like to do this next week. 7/19; patient presents for follow-up. She has been using Keystone antibiotics with calcium alginate to the wound beds. She has no issues or complaints today. Patient is in agreement to do the total  contact cast of the left foot today. She knows to return later this week for the obligatory cast change. 05-13-2022 upon evaluation today patient's wound which she has the cast of the left leg actually appears to be doing significantly better. Fortunately I do not see any signs of active infection locally or systemically which is great news and overall I am extremely pleased with where we stand currently. 7/26; patient presents for follow-up. She has a cast in place for the past week. She states it irritated her shin. Other than that she tolerated the cast well. She states she would like a break for 1 week from the cast. We have been using Keystone antibiotic and Aquacel to both wound beds. She denies signs of infection. Hafley, Demonica A. ([id number]) Objective Constitutional Vitals Time Taken: 11:14 AM, Temperature: 98.4 F, Pulse: 61 bpm, Respiratory Rate: 18 breaths/min, Blood Pressure: 97/63 mmHg. General Notes: Right foot: To the plantar heel there is an incision site with increased depth. Nonviable tissue at the surface, callus present. No probing to bone. Left foot: To the medial aspect there is an open wound with granulation tissue, nonviable tissue and circumferential callus. No surrounding signs of infection to any of the wound beds. Integumentary (Hair, Skin) Wound #11 status is Open. Original cause of wound was Surgical Injury. The date acquired was: 12/01/2021. The wound has been in treatment 9 weeks. The wound is located on the [place]. The wound measures 2.1cm length x 0.7cm width x 0.9cm depth; 1.155cm^2 area and 1.039cm^3 volume. There is Fat Layer (Subcutaneous Tissue) exposed. There is a medium amount of serosanguineous drainage noted. The wound margin is distinct with the outline attached to the wound base. There is medium (34-66%) pink, pale granulation within the wound bed. There is a medium (34-66%) amount of necrotic tissue within the  wound bed including Adherent  Slough. Wound #12 status is Open. Original cause of wound was Pressure Injury. The date acquired was: 03/16/2020. The wound has been in treatment 9 weeks. The wound is located on the [place]. The wound measures 3.9cm length x 1.5cm width x 0.2cm depth; 4.595cm^2 area and 0.919cm^3 volume. There is Fat Layer (Subcutaneous Tissue) exposed. There is a medium amount of serous drainage noted. The wound margin is flat and intact. There is large (67-100%) red, pink granulation within the wound bed. There is a small (1-33%) amount of necrotic tissue within the wound bed including Adherent Slough. 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 I debrided nonviable tissue. I recommended the total contact cast For the left leg however patient states she has soreness to the anterior aspect from where the previous one rubbed. There is minimal bruising but no open wounds. We will hold off on the cast for 1 week. I recommended doing Keystone and Aquacell and agressive offloading To the wound beds. After the encounter patient showed signs of a seizure. This lasted for about 30 seconds. She was alert and oriented x4 After symptoms resolved. No emptying of urine or bowels. 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 She is not on antiseizure medicine . Vitals were stable with a blood pressure of 119/77, pulse 68, 100% SPO2, glucose 155. No respiratory distress. EMS was contacted. They assessed the patient. Patient declined emergency room transfer. Patient appeared at her baseline when  leaving the clinic. She reports she will follow-up with her PCP. Procedures Wound #11 Pre-procedure diagnosis of Wound #11 is an Open Surgical Wound located on the Right,Plantar Foot . There was a Excisional Skin/Subcutaneous Tissue Debridement with a total area of 1.47 sq cm performed by Kalman Shan, MD. With the following instrument(s): Curette to remove Viable and Non-Viable tissue/material. Material removed includes Callus, Subcutaneous Tissue, and Slough. A time out was conducted at 11:49, prior to the start of the procedure. A Minimum amount of bleeding was controlled with Pressure. The procedure was tolerated well. Post Debridement Measurements: 2.1cm length x 0.7cm width x 0.9cm depth; 1.039cm^3 volume. Character of Wound/Ulcer Post Debridement is stable. 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 There was a Excisional Skin/Subcutaneous Tissue Debridement with a total area of 8 sq cm performed by Kalman Shan, MD. With the following instrument(s): Curette to remove Viable and Non-Viable tissue/material. Material removed includes Callus, Subcutaneous Tissue, and Slough. A time out was conducted at 11:44, prior to the start of the procedure. A Minimum amount of Castell, Shaterica A. ([id number]) bleeding was controlled with Pressure. The procedure was tolerated well. Post Debridement Measurements: 3.9cm length x 1.5cm width x 0.3cm depth; 1.378cm^3 volume. Character of Wound/Ulcer Post Debridement is stable. Severity of Tissue Post Debridement is: Fat layer exposed. Post procedure Diagnosis Wound #12: Same as Pre-Procedure Plan Follow-up Appointments: Return Appointment in 1 week. Nurse Visit as needed Bathing/ Shower/ Hygiene: Wash wounds with antibacterial soap  and  water. No tub bath. Anesthetic (Use 'Patient Medications' Section for Anesthetic Order Entry): Lidocaine applied to wound bed Edema Control - Lymphedema / Segmental Compressive Device / Other: Elevate, Exercise Daily and Avoid Standing for Long Periods of Time. Elevate legs to the level of the heart and pump ankles as often as possible Elevate leg(s) parallel to the floor when sitting. Off-Loading: Total Contact Cast to Left Lower Extremity - hold off on cast this week contact cast #2 applied Don't get cast wet Other: - keep pressure off of feet. Medications-Please add to medication list.: [employer] - Keystone gel WOUND #11: - Foot Wound Laterality: Plantar, Right Cleanser: Soap and Water 1 x Per Day/30 Days Discharge Instructions: Gently cleanse wound with antibacterial soap, rinse and pat dry prior to dressing wounds Topical: keystone gel 1 x Per Day/30 Days Primary Dressing: Aquacel Extra Hydrofiber Dressing, 4x5 (in/in) (Generic) 1 x Per Day/30 Days Discharge Instructions: packed into wound Secondary Dressing: ABD Pad 5x9 (in/in) (Generic) 1 x Per Day/30 Days Discharge Instructions: Cover with ABD pad Secured With: Medipore Tape - 433M Medipore H Soft Cloth Surgical Tape, 2x2 (in/yd) (Generic) 1 x Per Day/30 Days Secured With: Kerlix Roll Sterile or Non-Sterile 6-ply 4.5x4 (yd/yd) (Generic) 1 x Per Day/30 Days Discharge Instructions: Apply Kerlix as directed WOUND #12: - Foot Wound Laterality: Plantar, Left, Medial Cleanser: Soap and Water 1 x Per Day/30 Days Discharge Instructions: Gently cleanse wound with antibacterial soap, rinse and pat dry prior to dressing wounds Topical: keystone gel 1 x Per Day/30 Days Discharge Instructions: apply very thin layer to wound Primary Dressing: Aquacel Extra Hydrofiber Dressing, 4x5 (in/in) (Generic) 1 x Per Day/30 Days Discharge Instructions: placed over keystone gel Secondary Dressing: ABD Pad 5x9 (in/in) (Generic) 1 x Per Day/30  Days Discharge Instructions: Cover with ABD pad Secured With: Medipore Tape - 433M Medipore H Soft Cloth Surgical Tape, 2x2 (in/yd) (Generic) 1 x Per Day/30 Days Secured With: Kerlix Roll Sterile or Non-Sterile 6-ply 4.5x4 (yd/yd) (Generic) 1 x Per Day/30 Days Discharge Instructions: Apply Kerlix as directed 1. In office sharp debridement 2. Keystone antibiotic and Aquacel 3. Aggressive offloading 4. Follow-up in 1 week Electronic Signature(s) Signed: 05/18/2022 12:24:08 PM By: Kalman Shan DO Entered By: Kalman Shan on 05/18/2022 12:23:23 [name] ([id number]) -------------------------------------------------------------------------------- [place] Details Patient Name: [name] Date of Service: 05/18/2022 Medical Record Number: [id number] Patient Account Number: [id number] Date of Birth/Sex: [birth year] (46 y.o. F) Treating RN: Cornell Barman Primary Care Provider: Raelene Bott Other Clinician: Massie Kluver Referring Provider: Raelene Bott Treating Provider/Extender: Yaakov Guthrie in Treatment: 9 Diagnosis Coding ICD-10 Codes Code Description [phone] 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ity 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.621 Type 2 diabetes mellitus with foot ulcer E11.42 Type 2 diabetes mellitus with diabetic polyneuropathy L97.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 of skin Facility Procedures CPT4 Code: 96789381 Description: 11042 - DEB SUBQ TISSUE 20 SQ CM/< Modifier: Quantity: 1 CPT4 Code: Description: ICD-10 Diagnosis Description 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 L97.512 Non-pressure chronic ulcer of other part of right foot with fat layer expo E11.621 Type 2 diabetes mellitus with foot  ulcer Modifier: severity sed Quantity: Physician  Procedures CPT4 Code: [id number] Description: 11042 - WC PHYS SUBQ TISS 20 SQ CM Modifier: Quantity: 1 CPT4 Code: Description: ICD-10 Diagnosis Description  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 L97.512 Non-pressure chronic ulcer of other part of right foot with fat layer expo E11.621 Type 2 diabetes mellitus with foot  ulcer Modifier: severity sed Quantity: Electronic Signature(s) Signed: 05/18/2022 12:24:08 PM By: Kalman Shan DO Entered By: Kalman Shan on 05/18/2022 12:23:36

## 2022-05-19 NOTE — Progress Notes (Signed)
Heather, [name] ([id number]) Visit Report for 05/18/2022 Arrival Information Details Patient Name: [name], [name] Date of Service: 05/18/2022 11:15 AM Medical Record Number: [id number] Patient Account Number: [id number] Date of Birth/Sex: 22-Jun-1976 (46 y.o. F) Treating RN: Cornell Barman Primary Care Kathi Dohn: Raelene Bott Other Clinician: Massie Kluver Referring Leyla Soliz: Raelene Bott Treating Noha Karasik/Extender: Yaakov Guthrie in Treatment: 9 Visit Information History Since Last Visit All ordered tests and consults were completed: No Patient Arrived: Wheel Chair Added or deleted any medications: Yes Arrival Time: 11:08 Any new allergies or adverse reactions: No Transfer Assistance: EasyPivot Patient Lift Had a fall or experienced change in No activities of daily living that may affect risk of falls: Hospitalized since last visit: No Pain Present Now: Yes Electronic Signature(s) Signed: 05/19/2022 4:20:19 PM By: Massie Kluver Entered By: Massie Kluver on 05/18/2022 11:13:30 [name], [name] ([id number]) -------------------------------------------------------------------------------- Clinic Level of Care Assessment Details Patient Name: Heather Dear A. Date of Service: 05/18/2022 11:15 AM Medical Record Number: [id number] Patient Account Number: [id number] Date of Birth/Sex: Feb 19, 1976 (46 y.o. F) Treating RN: Cornell Barman Primary Care [name] Outten: Raelene Bott Other Clinician: Massie Kluver Referring Helem Reesor: Raelene Bott Treating Lucilia Yanni/Extender: Yaakov Guthrie in Treatment: 9 Clinic Level of Care Assessment Items TOOL 1 Quantity Score []  - Use when EandM and Procedure is performed on INITIAL visit 0 ASSESSMENTS - Nursing Assessment / Reassessment []  - General Physical Exam (combine w/ comprehensive assessment (listed just below) when performed on new 0 pt. evals) []  - 0 Comprehensive Assessment (HX, ROS, Risk Assessments, Wounds Hx,  etc.) ASSESSMENTS - Wound and Skin Assessment / Reassessment []  - Dermatologic / Skin Assessment (not related to wound area) 0 ASSESSMENTS - Ostomy and/or Continence Assessment and Care []  - Incontinence Assessment and Management 0 []  - 0 Ostomy Care Assessment and Management (repouching, etc.) PROCESS - Coordination of Care []  - Simple Patient / Family Education for ongoing care 0 []  - 0 Complex (extensive) Patient / Family Education for ongoing care []  - 0 Staff obtains Programmer, systems, Records, Test Results / Process Orders []  - 0 Staff telephones HHA, Nursing Homes / Clarify orders / etc []  - 0 Routine Transfer to another Facility (non-emergent condition) []  - 0 Routine Hospital Admission (non-emergent condition) []  - 0 New Admissions / Biomedical engineer / Ordering NPWT, Apligraf, etc. []  - 0 Emergency Hospital Admission (emergent condition) PROCESS - Special Needs []  - Pediatric / Minor Patient Management 0 []  - 0 Isolation Patient Management []  - 0 Hearing / Language / Visual special needs []  - 0 Assessment of Community assistance (transportation, D/C planning, etc.) []  - 0 Additional assistance / Altered mentation []  - 0 Support Surface(s) Assessment (bed, cushion, seat, etc.) INTERVENTIONS - Miscellaneous []  - External ear exam 0 []  - 0 Patient Transfer (multiple staff / Civil Service fast streamer / Similar devices) []  - 0 Simple Staple / Suture removal (25 or less) []  - 0 Complex Staple / Suture removal (26 or more) []  - 0 Hypo/Hyperglycemic Management (do not check if billed separately) []  - 0 Ankle / Brachial Index (ABI) - do not check if billed separately Has the patient been seen at the hospital within the last three years: Yes Total Score: 0 Level Of Care: ____ [name] ([id number]) Electronic Signature(s) Signed: 05/19/2022 4:20:19 PM By: Massie Kluver Entered By: Massie Kluver on 05/18/2022 11:56:15 [name], [name] Kitchen  ([id number]) -------------------------------------------------------------------------------- Encounter Discharge Information Details Patient Name: Heather Dear A. Date of Service: 05/18/2022 11:15 AM Medical Record Number: [id number] Patient Account Number:  [id number] Date of Birth/Sex: [birth year] (46 y.o. F) Treating RN: Cornell Barman Primary Care Monica Codd: Raelene Bott Other Clinician: Massie Kluver Referring Daneesha Quinteros: Raelene Bott Treating Kennedie Pardoe/Extender: Yaakov Guthrie in Treatment: 9 Encounter Discharge Information Items Post Procedure Vitals Discharge Condition: Stable Temperature (F): 98.4 Ambulatory Status: Wheelchair Pulse (bpm): 68 Discharge Destination: Home Respiratory Rate (breaths/min): 16 Transportation: Private Auto Blood Pressure (mmHg): 119/77 Accompanied By: mother Schedule Follow-up Appointment: Yes Clinical Summary of Care: Notes Patient had seizure while sitting in exam chair approximately 12 pm. Lasted about 2-3 minutes, vitals taken, CBG 155 at 12:05pm, EMS called, patient refused to go to ER. Patient stable when leaving Electronic Signature(s) Signed: 05/19/2022 4:20:19 PM By: Massie Kluver Entered By: Massie Kluver on 05/18/2022 12:21:11 [name] ([id number]) -------------------------------------------------------------------------------- Lower Extremity Assessment Details Patient Name: Heather Dear A. Date of Service: 05/18/2022 11:15 AM Medical Record Number: [id number] Patient Account Number: [id number] Date of Birth/Sex: [birth year] (46 y.o. F) Treating RN: Cornell Barman Primary Care Takyah Ciaramitaro: Raelene Bott Other Clinician: Massie Kluver Referring Siana Panameno: Raelene Bott Treating Desha Bitner/Extender: Yaakov Guthrie in Treatment: 9 Electronic Signature(s) Signed: 05/18/2022 4:29:23 PM By: Gretta Cool BSN, RN, CWS, [name] RN, BSN Signed: 05/19/2022 4:20:19 PM By: Massie Kluver Entered By: Massie Kluver on 05/18/2022  11:39:07 [name], [name] ([id number]) -------------------------------------------------------------------------------- Multi Wound Chart Details Patient Name: Heather Dear A. Date of Service: 05/18/2022 11:15 AM Medical Record Number: [id number] Patient Account Number: [id number] Date of Birth/Sex: Sep 04, 1976 (46 y.o. 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 Comorbid History: Chronic sinus problems/congestion, Chronic sinus problems/congestion, N/A Middle ear problems, Anemia, Middle ear problems, Anemia, Chronic Obstructive Pulmonary Chronic Obstructive Pulmonary Disease (COPD), Congestive Heart Disease (COPD), Congestive Heart Failure, Type II Diabetes, End Stage Failure, Type II Diabetes, End Stage Renal Disease, History of pressure Renal Disease, History of pressure wounds, Neuropathy wounds, Neuropathy Date Acquired: 12/01/2021 03/16/2020 N/A Weeks of Treatment: 9 9 N/A Wound Status: Open Open N/A Wound Recurrence: No No N/A Measurements L x W x D (cm) 2.1x0.7x0.9 3.9x1.5x0.2 N/A Area (cm) : 1.155 4.595 N/A Volume (cm) : 1.039 0.919 N/A % Reduction in Area: 37.40% 2.50% N/A % Reduction in Volume: 37.40% 2.40% N/A Classification: Full Thickness Without Exposed Grade 3 N/A Support Structures Exudate Amount: Medium Medium N/A Exudate Type: Serosanguineous Serous N/A Exudate Color: red, brown amber N/A Wound Margin: Distinct, outline  attached Flat and Intact N/A Granulation Amount: Medium (34-66%) Large (67-100%) N/A Granulation Quality: Pink, Pale Red, Pink N/A Necrotic Amount: Medium (34-66%) Small (1-33%) N/A Exposed Structures: Fat Layer (Subcutaneous Tissue): Fat Layer (Subcutaneous Tissue): N/A Yes Yes Fascia: No Fascia: No Tendon: No Tendon: No Muscle: No Muscle: No Joint: No Joint: No Bone: No Bone: No Epithelialization: Small (1-33%) Small (1-33%) N/A Treatment Notes [name], [name] ([id number]) Electronic Signature(s) Signed: 05/19/2022 4:20:19 PM By: Massie Kluver Entered By: Massie Kluver on 05/18/2022 11:39:36 [name] ([id number]) -------------------------------------------------------------------------------- [place] Details Patient Name: Heather Dear A. Date of Service: 05/18/2022 11:15 AM Medical Record Number: [id number] Patient Account Number: [id number] Date of Birth/Sex: [birth year] (46 y.o. F) Treating RN: Cornell Barman Primary Care Presley Summerlin: Raelene Bott  Other Clinician: Massie Kluver Referring Hatem Cull: Raelene Bott Treating Brazil Voytko/Extender: Yaakov Guthrie in Treatment: 9 Active Inactive Abuse / Safety / Falls / Self Care Management Nursing Diagnoses: History of Falls Potential for falls Potential for injury related to falls Goals: Patient will not develop complications from immobility Date Initiated: 03/17/2022 Date Inactivated: 04/20/2022 Target Resolution Date: 03/16/2022 Goal Status: Met Patient/caregiver will verbalize understanding of skin care regimen Date Initiated: 03/17/2022 Target Resolution Date: 03/16/2022 Goal Status: Active Patient/caregiver will verbalize/demonstrate measure taken to improve self care Date Initiated: 03/17/2022 Target Resolution Date: 03/16/2022 Goal Status: Active Interventions: Assess fall risk on admission and as needed Provide education on basic hygiene Provide education on personal and home  safety Notes: Necrotic Tissue Nursing Diagnoses: Impaired tissue integrity related to necrotic/devitalized tissue Knowledge deficit related to management of necrotic/devitalized tissue Goals: Necrotic/devitalized tissue will be minimized in the wound bed Date Initiated: 03/17/2022 Date Inactivated: 05/04/2022 Target Resolution Date: 03/16/2022 Goal Status: Met Patient/caregiver will verbalize understanding of reason and process for debridement of necrotic tissue Date Initiated: 03/17/2022 Target Resolution Date: 03/16/2022 Goal Status: Active Interventions: Provide education on necrotic tissue and debridement process Treatment Activities: Apply topical anesthetic as ordered : 03/16/2022 Excisional debridement : 03/16/2022 Notes: Osteomyelitis Nursing Diagnoses: Infection: osteomyelitis Knowledge deficit related to disease process and management SHAKEVIA, SARRIS ([id number]) Goals: Patient/caregiver will verbalize understanding of disease process and disease management Date Initiated: 03/17/2022 Date Inactivated: 04/20/2022 Target Resolution Date: 03/16/2022 Goal Status: Met Patient's osteomyelitis will resolve Date Initiated: 03/17/2022 Target Resolution Date: 03/16/2022 Goal Status: Active Signs and symptoms for osteomyelitis will be recognized and promptly addressed Date Initiated: 03/17/2022 Target Resolution Date: 03/16/2022 Goal Status: Active Interventions: Assess for signs and symptoms of osteomyelitis resolution every visit Provide education on osteomyelitis Treatment Activities: Systemic antibiotics : 03/16/2022 Notes: Wound/Skin Impairment Nursing Diagnoses: Impaired tissue integrity Knowledge deficit related to smoking impact on wound healing Knowledge deficit related to ulceration/compromised skin integrity Goals: Patient/caregiver will verbalize understanding of skin care regimen Date Initiated: 03/17/2022 Date Inactivated: 04/20/2022 Target Resolution Date:  03/16/2022 Goal Status: Met Ulcer/skin breakdown will have a volume reduction of 30% by week 4 Date Initiated: 03/17/2022 Target Resolution Date: 04/13/2022 Goal Status: Active Ulcer/skin breakdown will have a volume reduction of 50% by week 8 Date Initiated: 03/17/2022 Target Resolution Date: 05/04/2022 Goal Status: Active Ulcer/skin breakdown will have a volume reduction of 80% by week 12 Date Initiated: 03/17/2022 Target Resolution Date: 06/01/2022 Goal Status: Active Ulcer/skin breakdown will heal within 14 weeks Date Initiated: 03/17/2022 Target Resolution Date: 06/15/2022 Goal Status: Active Interventions: Assess ulceration(s) every visit Provide education on ulcer and skin care Treatment Activities: Skin care regimen initiated : 03/16/2022 Notes: Electronic Signature(s) Signed: 05/18/2022 4:29:23 PM By: Gretta Cool, BSN, RN, CWS, [name] RN, BSN Signed: 05/19/2022 4:20:19 PM By: Massie Kluver Entered By: Massie Kluver on 05/18/2022 11:39:13 [name], [name] ([id number]) -------------------------------------------------------------------------------- Pain Assessment Details Patient Name: Heather Dear A. Date of Service: 05/18/2022 11:15 AM Medical Record Number: [id number] Patient Account Number: [id number] Date of Birth/Sex: Dec 17, 1975 (46 y.o. F) Treating RN: Cornell Barman Primary Care Haygen Zebrowski: Raelene Bott Other Clinician: Massie Kluver Referring Ashantae Pangallo: Raelene Bott Treating Yuval Rubens/Extender: Yaakov Guthrie in Treatment: 9 Active Problems Location of Pain Severity and Description of Pain Patient Has Paino Yes Site Locations Pain Location: Generalized Pain, Pain in Ulcers Duration of the Pain. Constant / Intermittento Constant Rate the pain. Current Pain Level: 9 Character of Pain Describe the Pain: Aching, Burning, Throbbing Pain Management and Medication Current Pain Management: Medication:  Yes Rest: Yes Electronic Signature(s) Signed: 05/18/2022 4:29:23 PM  By: Gretta Cool, BSN, RN, CWS, [name] RN, BSN Signed: 05/19/2022 4:20:19 PM By: Massie Kluver Entered By: Massie Kluver on 05/18/2022 11:20:13 [name] ([id number]) -------------------------------------------------------------------------------- Patient/Caregiver Education Details Patient Name: Heather Dear A. Date of Service: 05/18/2022 11:15 AM Medical Record Number: [id number] Patient Account Number: [id number] Date of Birth/Gender: 10-30-1975 (46 y.o. F) Treating RN: Cornell Barman Primary Care Physician: Raelene Bott Other Clinician: Massie Kluver Referring Physician: Raelene Bott Treating Physician/Extender: Yaakov Guthrie in Treatment: 9 Education Assessment Education Provided To: Patient and Caregiver Education Topics Provided Pressure: Handouts: Pressure Ulcers: Care and Offloading Wound/Skin Impairment: Handouts: Other: continue wound care as directed Electronic Signature(s) Signed: 05/19/2022 4:20:19 PM By: Massie Kluver Entered By: Massie Kluver on 05/18/2022 12:06:11 [name], [name] ([id number]) -------------------------------------------------------------------------------- Wound Assessment Details Patient Name: Heather Dear A. Date of Service: 05/18/2022 11:15 AM Medical Record Number: [id number] Patient Account Number: [id number] Date of Birth/Sex: 1976-08-17 (46 y.o. F) Treating RN: Cornell Barman Primary Care Ellwood Steidle: Raelene Bott Other Clinician: Massie Kluver Referring Devlin Mcveigh: Raelene Bott Treating Shalini Mair/Extender: Yaakov Guthrie in Treatment: 9 Wound Status Wound Number: 11 Primary Open Surgical Wound Etiology: Wound Location: Right, Plantar Foot Wound Open Wounding Event: Surgical Injury Status: Date Acquired: 12/01/2021 Comorbid Chronic sinus problems/congestion, Middle ear problems, Weeks Of Treatment: 9 History: Anemia, Chronic Obstructive Pulmonary Disease (COPD), Clustered Wound: No Congestive Heart Failure, Type II  Diabetes, End Stage Renal Disease, History of pressure wounds, Neuropathy Photos Wound Measurements Length: (cm) 2.1 Width: (cm) 0.7 Depth: (cm) 0.9 Area: (cm) 1.155 Volume: (cm) 1.039 % Reduction in Area: 37.4% % Reduction in Volume: 37.4% Epithelialization: Small (1-33%) Wound Description Classification: Full Thickness Without Exposed Support Structures Wound Margin: Distinct, outline attached Exudate Amount: Medium Exudate Type: Serosanguineous Exudate Color: red, brown Foul Odor After Cleansing: No Slough/Fibrino Yes Wound Bed Granulation Amount: Medium (34-66%) Exposed Structure Granulation Quality: Pink, Pale Fascia Exposed: No Necrotic Amount: Medium (34-66%) Fat Layer (Subcutaneous Tissue) Exposed: Yes Necrotic Quality: Adherent Slough Tendon Exposed: No Muscle Exposed: No Joint Exposed: No Bone Exposed: No Treatment Notes Wound #11 (Foot) Wound Laterality: Plantar, Right Cleanser Soap and Water Discharge Instruction: Gently cleanse wound with antibacterial soap, rinse and pat dry prior to dressing wounds [name], [name] ([id number]) Peri-Wound Care Topical keystone gel Primary Dressing Aquacel Extra Hydrofiber Dressing, 4x5 (in/in) Discharge Instruction: packed into wound Secondary Dressing ABD Pad 5x9 (in/in) Discharge Instruction: Cover with ABD pad Secured With Medipore Tape - 61M Medipore H Soft Cloth Surgical Tape, 2x2 (in/yd) Kerlix Roll Sterile or Non-Sterile 6-ply 4.5x4 (yd/yd) Discharge Instruction: Apply Kerlix as directed Compression Wrap Compression Stockings Add-Ons Electronic Signature(s) Signed: 05/18/2022 4:29:23 PM By: Gretta Cool, BSN, RN, CWS, [name] RN, BSN Signed: 05/19/2022 4:20:19 PM By: Massie Kluver Entered By: Massie Kluver on 05/18/2022 11:38:26 [name], [name] ([id number]) -------------------------------------------------------------------------------- Wound Assessment Details Patient Name: Heather Dear A. Date of Service:  05/18/2022 11:15 AM Medical Record Number: [id number] Patient Account Number: [id number] Date of Birth/Sex: 1975-12-10 (46 y.o. F) Treating RN: Cornell Barman Primary Care Eligah Anello: Raelene Bott Other Clinician: Massie Kluver Referring Katelyn Broadnax: Raelene Bott Treating Erica Richwine/Extender: Yaakov Guthrie in Treatment: 9 Wound Status Wound Number: 12 Primary Diabetic Wound/Ulcer of the Lower Extremity Etiology: Wound Location: Left, Medial, Plantar Foot Wound Open Wounding Event: Pressure Injury Status: Date Acquired: 03/16/2020 Comorbid Chronic sinus problems/congestion, Middle ear problems, Weeks Of Treatment: 9 History: Anemia, Chronic Obstructive Pulmonary Disease (COPD), Clustered Wound: No Congestive Heart Failure, Type II Diabetes, End Stage  Renal Disease, History of pressure wounds, Neuropathy Photos Wound Measurements Length: (cm) 3.9 Width: (cm) 1.5 Depth: (cm) 0.2 Area: (cm) 4.595 Volume: (cm) 0.919 % Reduction in Area: 2.5% % Reduction in Volume: 2.4% Epithelialization: Small (1-33%) Wound Description Classification: Grade 3 Wound Margin: Flat and Intact Exudate Amount: Medium Exudate Type: Serous Exudate Color: amber Foul Odor After Cleansing: No Slough/Fibrino Yes Wound Bed Granulation Amount: Large (67-100%) Exposed Structure Granulation Quality: Red, Pink Fascia Exposed: No Necrotic Amount: Small (1-33%) Fat Layer (Subcutaneous Tissue) Exposed: Yes Necrotic Quality: Adherent Slough Tendon Exposed: No Muscle Exposed: No Joint Exposed: No Bone Exposed: No Treatment Notes Wound #12 (Foot) Wound Laterality: Plantar, Left, Medial Cleanser Soap and Water Discharge Instruction: Gently cleanse wound with antibacterial soap, rinse and pat dry prior to dressing wounds Medeiros, [name] ([id number]) Peri-Wound Care Topical keystone gel Discharge Instruction: apply very thin layer to wound Primary Dressing Aquacel Extra Hydrofiber Dressing, 4x5  (in/in) Discharge Instruction: placed over keystone gel Secondary Dressing ABD Pad 5x9 (in/in) Discharge Instruction: Cover with ABD pad Secured With Medipore Tape - 61M Medipore H Soft Cloth Surgical Tape, 2x2 (in/yd) Kerlix Roll Sterile or Non-Sterile 6-ply 4.5x4 (yd/yd) Discharge Instruction: Apply Kerlix as directed Compression Wrap Compression Stockings Add-Ons Electronic Signature(s) Signed: 05/18/2022 4:29:23 PM By: Gretta Cool, BSN, RN, CWS, [name] RN, BSN Signed: 05/19/2022 4:20:19 PM By: Massie Kluver Entered By: Massie Kluver on 05/18/2022 11:38:56 [name], [name] ([id number]) -------------------------------------------------------------------------------- [place] Details Patient Name: Heather Dear A. Date of Service: 05/18/2022 11:15 AM Medical Record Number: [id number] Patient Account Number: [id number] Date of Birth/Sex: 1975-11-28 (46 y.o. F) Treating RN: Cornell Barman Primary Care Kinzie Wickes: Raelene Bott Other Clinician: Massie Kluver Referring Bernise Sylvain: Raelene Bott Treating Nashonda Limberg/Extender: Yaakov Guthrie in Treatment: 9 Vital Signs Time Taken: 11:14 Temperature (F): 98.4 Pulse (bpm): 61 Respiratory Rate (breaths/min): 18 Blood Pressure (mmHg): 97/63 Reference Range: 80 - 120 mg / dl Electronic Signature(s) Signed: 05/19/2022 4:20:19 PM By: Massie Kluver Entered By: Massie Kluver on 05/18/2022 11:20:07

## 2022-05-25 ENCOUNTER — Encounter: Payer: Medicaid Other | Attending: Internal Medicine | Admitting: Internal Medicine

## 2022-05-25 DIAGNOSIS — G40909 Epilepsy, unspecified, not intractable, without status epilepticus: Secondary | ICD-10-CM | POA: Insufficient documentation

## 2022-05-25 DIAGNOSIS — J449 Chronic obstructive pulmonary disease, unspecified: Secondary | ICD-10-CM | POA: Insufficient documentation

## 2022-05-25 DIAGNOSIS — E11621 Type 2 diabetes mellitus with foot ulcer: Secondary | ICD-10-CM | POA: Diagnosis not present

## 2022-05-25 DIAGNOSIS — E1122 Type 2 diabetes mellitus with diabetic chronic kidney disease: Secondary | ICD-10-CM | POA: Diagnosis not present

## 2022-05-25 DIAGNOSIS — M21372 Foot drop, left foot: Secondary | ICD-10-CM | POA: Insufficient documentation

## 2022-05-25 DIAGNOSIS — I132 Hypertensive heart and chronic kidney disease with heart failure and with stage 5 chronic kidney disease, or end stage renal disease: Secondary | ICD-10-CM | POA: Diagnosis not present

## 2022-05-25 DIAGNOSIS — E1142 Type 2 diabetes mellitus with diabetic polyneuropathy: Secondary | ICD-10-CM | POA: Insufficient documentation

## 2022-05-25 DIAGNOSIS — N186 End stage renal disease: Secondary | ICD-10-CM | POA: Diagnosis not present

## 2022-05-25 DIAGNOSIS — I429 Cardiomyopathy, unspecified: Secondary | ICD-10-CM | POA: Diagnosis not present

## 2022-05-25 DIAGNOSIS — F319 Bipolar disorder, unspecified: Secondary | ICD-10-CM | POA: Diagnosis not present

## 2022-05-25 DIAGNOSIS — L97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ity: Secondary | ICD-10-CM | POA: Diagnosis not present

## 2022-05-25 DIAGNOSIS — I5022 Chronic systolic (congestive) heart failure: Secondary | ICD-10-CM | POA: Insufficient documentation

## 2022-05-25 DIAGNOSIS — L97512 Non-pressure chronic ulcer of other part of right foot with fat layer exposed: Secondary | ICD-10-CM | POA: Insufficient documentation

## 2022-05-25 DIAGNOSIS — L97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 of skin: Secondary | ICD-10-CM | POA: Diagnosis not present

## 2022-05-27 NOTE — Progress Notes (Signed)
[name], [name] ([id number]) Visit Report for 05/25/2022 Arrival Information Details Patient Name: [name], [name] Date of Service: 05/25/2022 2:15 PM Medical Record Number: [id number] Patient Account Number: [id number] Date of Birth/Sex: 07/23/1976 (46 y.o. F) Treating [name]: [name] Primary Care [name]: [name] Other Clinician: Massie [name] Referring [name]: [name] Treating [name]/Extender: [name] in Treatment: 10 Visit Information History Since Last Visit All ordered tests and consults were completed: No Patient Arrived: Wheel Chair Added or deleted any medications: No Arrival Time: 14:35 Any new allergies or adverse reactions: No Transfer Assistance: EasyPivot Patient Lift Had a fall or experienced change in No activities of daily living that may affect risk of falls: Hospitalized since last visit: No Pain Present Now: Yes Electronic Signature(s) Signed: 05/25/2022 4:00:18 PM By: [name] Entered By: [name] on 05/25/2022 14:43:12 [name], [name] ([id number]) -------------------------------------------------------------------------------- Clinic Level of Care Assessment Details Patient Name: Heather Dear A. Date of Service: 05/25/2022 2:15 PM Medical Record Number: [id number] Patient Account Number: [id number] Date of Birth/Sex: 1976/02/12 (46 y.o. F) Treating [name]: [name] Primary Care [name]: [name] Other Clinician: Massie [name] Referring [name]: [name] Treating [name]/Extender: [name] in Treatment: 10 Clinic Level of Care Assessment Items TOOL 1 Quantity Score []  - Use when EandM and Procedure is performed on INITIAL visit 0 ASSESSMENTS - Nursing Assessment / Reassessment []  - General Physical Exam (combine w/ comprehensive assessment (listed just below) when performed on new 0 pt. evals) []  - 0 Comprehensive Assessment (HX, ROS, Risk Assessments, Wounds Hx,  etc.) ASSESSMENTS - Wound and Skin Assessment / Reassessment []  - Dermatologic / Skin Assessment (not related to wound area) 0 ASSESSMENTS - Ostomy and/or Continence Assessment and Care []  - Incontinence Assessment and Management 0 []  - 0 Ostomy Care Assessment and Management (repouching, etc.) PROCESS - Coordination of Care []  - Simple Patient / Family Education for ongoing care 0 []  - 0 Complex (extensive) Patient / Family Education for ongoing care []  - 0 Staff obtains Programmer, systems, Records, Test Results / Process Orders []  - 0 Staff telephones HHA, Nursing Homes / Clarify orders / etc []  - 0 Routine Transfer to another Facility (non-emergent condition) []  - 0 Routine Hospital Admission (non-emergent condition) []  - 0 New Admissions / Biomedical engineer / Ordering NPWT, Apligraf, etc. []  - 0 Emergency Hospital Admission (emergent condition) PROCESS - Special Needs []  - Pediatric / Minor Patient Management 0 []  - 0 Isolation Patient Management []  - 0 Hearing / Language / Visual special needs []  - 0 Assessment of Community assistance (transportation, D/C planning, etc.) []  - 0 Additional assistance / Altered mentation []  - 0 Support Surface(s) Assessment (bed, cushion, seat, etc.) INTERVENTIONS - Miscellaneous []  - External ear exam 0 []  - 0 Patient Transfer (multiple staff / Civil Service fast streamer / Similar devices) []  - 0 Simple Staple / Suture removal (25 or less) []  - 0 Complex Staple / Suture removal (26 or more) []  - 0 Hypo/Hyperglycemic Management (do not check if billed separately) []  - 0 Ankle / Brachial Index (ABI) - do not check if billed separately Has the patient been seen at the hospital within the last three years: Yes Total Score: 0 Level Of Care: ____ [name] ([id number]) Electronic Signature(s) Signed: 05/25/2022 4:00:18 PM By: [name] Entered By: [name] on 05/25/2022 15:13:12 [name], [place].  ([id number]) -------------------------------------------------------------------------------- Encounter Discharge Information Details Patient Name: Heather Dear A. Date of Service: 05/25/2022 2:15 PM Medical Record Number: [id number] Patient Account Number:  [id number] Date of Birth/Sex: 1976/05/20 (46 y.o. F) Treating [name]: [name] Primary Care [name]: [name] Other Clinician: Massie [name] Referring [name]: [name] Treating [name]/Extender: [name] in Treatment: 10 Encounter Discharge Information Items Post Procedure Vitals Discharge Condition: Stable Temperature (F): 98.2 Ambulatory Status: Wheelchair Pulse (bpm): 70 Discharge Destination: Home Respiratory Rate (breaths/min): 18 Transportation: Private Auto Blood Pressure (mmHg): 119/81 Accompanied By: mother Schedule Follow-up Appointment: Yes Clinical Summary of Care: Electronic Signature(s) Signed: 05/25/2022 4:00:18 PM By: [name] Entered By: [name] on 05/25/2022 15:15:28 [name], [name] ([id number]) -------------------------------------------------------------------------------- Lower Extremity Assessment Details Patient Name: Heather Dear A. Date of Service: 05/25/2022 2:15 PM Medical Record Number: [id number] Patient Account Number: [id number] Date of Birth/Sex: 06-08-1976 (46 y.o. F) Treating [name]: [name] Primary Care Yerik Zeringue: [name] Other Clinician: Massie [name] Referring Addelynn Batte: [name] Treating [name]/Extender: [name] in Treatment: 10 Electronic Signature(s) Signed: 05/25/2022 4:00:18 PM By: [name] Signed: 05/27/2022 3:39:08 PM By: [name], BSN, [name], CWS, [name], BSN Entered By: [name] on 05/25/2022 15:00:12 [name] ([id number]) -------------------------------------------------------------------------------- Multi Wound Chart Details Patient Name: Heather Dear A. Date of Service: 05/25/2022 2:15  PM Medical Record Number: [id number] Patient Account Number: [id number] Date of Birth/Sex: December 13, 1975 (46 y.o. 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 Comorbid History: Chronic sinus problems/congestion, Chronic sinus problems/congestion, N/A Middle ear problems, Anemia, Middle ear problems, Anemia, Chronic Obstructive Pulmonary Chronic Obstructive Pulmonary Disease (COPD), Congestive Heart Disease (COPD), Congestive Heart Failure, Type II Diabetes, End Stage Failure, Type II Diabetes, End Stage Renal Disease, History of pressure Renal Disease, History of pressure wounds, Neuropathy wounds, Neuropathy Date Acquired: 12/01/2021 03/16/2020 N/A Weeks of Treatment: 10 10 N/A Wound Status: Open Open N/A Wound Recurrence: No No N/A Measurements L x W x D (cm) 1.9x0.7x0.6 3x1.4x0.3 N/A Area (cm) : 1.045 3.299 N/A Volume (cm) : 0.627 0.99 N/A % Reduction in Area: 43.40% 30.00% N/A % Reduction in Volume: 62.30% -5.10% N/A Classification: Full Thickness Without Exposed Grade 3 N/A Support Structures Exudate Amount: Medium Medium N/A Exudate Type: Serosanguineous Serous N/A Exudate Color: red, brown amber N/A Wound Margin: Distinct, outline attached Flat and Intact N/A Granulation Amount: Medium (34-66%) Large (67-100%) N/A Granulation Quality: Pink, Pale Red, Pink N/A Necrotic Amount: Medium (34-66%) Small (1-33%) N/A Exposed Structures: Fat Layer  (Subcutaneous Tissue): Fat Layer (Subcutaneous Tissue): N/A Yes Yes Fascia: No Fascia: No Tendon: No Tendon: No Muscle: No Muscle: No Joint: No Joint: No Bone: No Bone: No Epithelialization: Small (1-33%) Small (1-33%) N/A Treatment Notes [name], [name] ([id number]) Electronic Signature(s) Signed: 05/25/2022 4:00:18 PM By: [name] Entered By: [name] on 05/25/2022 15:00:37 [name] ([id number]) -------------------------------------------------------------------------------- [place] Details Patient Name: Heather Dear A. Date of Service: 05/25/2022 2:15 PM Medical Record Number: [id number] Patient Account Number: [id number] Date of Birth/Sex: Aug 13, 1976 (46 y.o. F) Treating [name]: [name] Primary Care Finian Helvey: [name] Other Clinician: Massie [name] Referring Waseem Suess: [name] Treating Bret Stamour/Extender: [name] in Treatment: 10 Active Inactive Abuse / Safety / Falls / Self Care Management Nursing Diagnoses: History of Falls Potential for  falls Potential for injury related to falls Goals: Patient will not develop complications from immobility Date Initiated: 03/17/2022 Date Inactivated: 04/20/2022 Target Resolution Date: 03/16/2022 Goal Status: Met Patient/caregiver will verbalize understanding of skin care regimen Date Initiated: 03/17/2022 Target Resolution Date: 03/16/2022 Goal Status: Active Patient/caregiver will verbalize/demonstrate measure taken to improve self care Date Initiated: 03/17/2022 Target Resolution Date: 03/16/2022 Goal Status: Active Interventions: Assess fall risk on admission and as needed Provide education on basic hygiene Provide education on personal and home safety Notes: Necrotic Tissue Nursing Diagnoses: Impaired tissue integrity related to necrotic/devitalized tissue Knowledge deficit related to management of necrotic/devitalized tissue Goals: Necrotic/devitalized  tissue will be minimized in the wound bed Date Initiated: 03/17/2022 Date Inactivated: 05/04/2022 Target Resolution Date: 03/16/2022 Goal Status: Met Patient/caregiver will verbalize understanding of reason and process for debridement of necrotic tissue Date Initiated: 03/17/2022 Target Resolution Date: 03/16/2022 Goal Status: Active Interventions: Provide education on necrotic tissue and debridement process Treatment Activities: Apply topical anesthetic as ordered : 03/16/2022 Excisional debridement : 03/16/2022 Notes: Osteomyelitis Nursing Diagnoses: Infection: osteomyelitis Knowledge deficit related to disease process and management [name], [name] ([id number]) Goals: Patient/caregiver will verbalize understanding of disease process and disease management Date Initiated: 03/17/2022 Date Inactivated: 04/20/2022 Target Resolution Date: 03/16/2022 Goal Status: Met Patient's osteomyelitis will resolve Date Initiated: 03/17/2022 Target Resolution Date: 03/16/2022 Goal Status: Active Signs and symptoms for osteomyelitis will be recognized and promptly addressed Date Initiated: 03/17/2022 Target Resolution Date: 03/16/2022 Goal Status: Active Interventions: Assess for signs and symptoms of osteomyelitis resolution every visit Provide education on osteomyelitis Treatment Activities: Systemic antibiotics : 03/16/2022 Notes: Wound/Skin Impairment Nursing Diagnoses: Impaired tissue integrity Knowledge deficit related to smoking impact on wound healing Knowledge deficit related to ulceration/compromised skin integrity Goals: Patient/caregiver will verbalize understanding of skin care regimen Date Initiated: 03/17/2022 Date Inactivated: 04/20/2022 Target Resolution Date: 03/16/2022 Goal Status: Met Ulcer/skin breakdown will have a volume reduction of 30% by week 4 Date Initiated: 03/17/2022 Target Resolution Date: 04/13/2022 Goal Status: Active Ulcer/skin breakdown will have a volume  reduction of 50% by week 8 Date Initiated: 03/17/2022 Target Resolution Date: 05/04/2022 Goal Status: Active Ulcer/skin breakdown will have a volume reduction of 80% by week 12 Date Initiated: 03/17/2022 Target Resolution Date: 06/01/2022 Goal Status: Active Ulcer/skin breakdown will heal within 14 weeks Date Initiated: 03/17/2022 Target Resolution Date: 06/15/2022 Goal Status: Active Interventions: Assess ulceration(s) every visit Provide education on ulcer and skin care Treatment Activities: Skin care regimen initiated : 03/16/2022 Notes: Electronic Signature(s) Signed: 05/25/2022 4:00:18 PM By: [name] Signed: 05/27/2022 3:39:08 PM By: [name], BSN, [name], CWS, [name], BSN Entered By: [name] on 05/25/2022 15:00:20 Heather Dear A. ([id number]) -------------------------------------------------------------------------------- Pain Assessment Details Patient Name: Heather Dear A. Date of Service: 05/25/2022 2:15 PM Medical Record Number: [id number] Patient Account Number: [id number] Date of Birth/Sex: [birth year] (46 y.o. F) Treating [name]: [name] Primary Care Brenetta Penny: [name] Other Clinician: Massie [name] Referring Allean Montfort: [name] Treating Berdia Lachman/Extender: [name] in Treatment: 10 Active Problems Location of Pain Severity and Description of Pain Patient Has Paino Yes Site Locations Duration of the Pain. Constant / Intermittento Constant Rate the pain. Current Pain Level: 6 Character of Pain Describe the Pain: Aching, Burning Pain Management and Medication Current Pain Management: Medication: Yes Rest: Yes Electronic Signature(s) Signed: 05/25/2022 4:00:18 PM By: [name] Signed: 05/27/2022 3:39:08 PM By: [name], BSN, [name], CWS, [name], BSN Entered By: [name] on 05/25/2022 14:46:41 [name] ([id number]) -------------------------------------------------------------------------------- Patient/Caregiver Education  Details Patient Name:  Bloor, Isys A. Date of Service: 05/25/2022 2:15 PM Medical Record Number: [id number] Patient Account Number: [id number] Date of Birth/Gender: 19-Apr-1976 (46 y.o. F) Treating [name]: [name] Primary Care Physician: [name] Other Clinician: Massie [name] Referring Physician: Raelene [name] Treating Physician/Extender: [name] in Treatment: 10 Education Assessment Education Provided To: Patient Education Topics Provided Wound/Skin Impairment: Handouts: Other: continue wound care as directed Electronic Signature(s) Signed: 05/25/2022 4:00:18 PM By: [name] Entered By: [name] on 05/25/2022 15:13:40 [name], [name] ([id number]) -------------------------------------------------------------------------------- Wound Assessment Details Patient Name: Heather Dear A. Date of Service: 05/25/2022 2:15 PM Medical Record Number: [id number] Patient Account Number: [id number] Date of Birth/Sex: [birth year] (46 y.o. F) Treating [name]: [name] Primary Care Genavive Kubicki: [name] Other Clinician: Massie [name] Referring Elmin Wiederholt: [name] Treating Deriana Vanderhoef/Extender: [name] in Treatment: 10 Wound Status Wound Number: 11 Primary Open Surgical Wound Etiology: Wound Location: Right, Plantar Foot Wound Open Wounding Event: Surgical Injury Status: Date Acquired: 12/01/2021 Comorbid Chronic sinus problems/congestion, Middle ear problems, Weeks Of Treatment: 10 History: Anemia, Chronic Obstructive Pulmonary Disease (COPD), Clustered Wound: No Congestive Heart Failure, Type II Diabetes, End Stage Renal Disease, History of pressure wounds, Neuropathy Photos Wound Measurements Length: (cm) 1.9 Width: (cm) 0.7 Depth: (cm) 0.6 Area: (cm) 1.045 Volume: (cm) 0.627 % Reduction in Area: 43.4% % Reduction in Volume: 62.3% Epithelialization: Small (1-33%) Wound Description Classification: Full Thickness Without Exposed  Support Structures Wound Margin: Distinct, outline attached Exudate Amount: Medium Exudate Type: Serosanguineous Exudate Color: red, brown Foul Odor After Cleansing: No Slough/Fibrino Yes Wound Bed Granulation Amount: Medium (34-66%) Exposed Structure Granulation Quality: Pink, Pale Fascia Exposed: No Necrotic Amount: Medium (34-66%) Fat Layer (Subcutaneous Tissue) Exposed: Yes Necrotic Quality: Adherent Slough Tendon Exposed: No Muscle Exposed: No Joint Exposed: No Bone Exposed: No Treatment Notes Wound #11 (Foot) Wound Laterality: Plantar, Right Cleanser Soap and Water Discharge Instruction: Gently cleanse wound with antibacterial soap, rinse and pat dry prior to dressing wounds [name], [name] ([id number]) Peri-Wound Care Topical keystone gel Primary Dressing Aquacel Extra Hydrofiber Dressing, 4x5 (in/in) Discharge Instruction: packed into wound Secondary Dressing ABD Pad 5x9 (in/in) Discharge Instruction: Cover with ABD pad Secured With Medipore Tape - 40M Medipore H Soft Cloth Surgical Tape, 2x2 (in/yd) Kerlix Roll Sterile or Non-Sterile 6-ply 4.5x4 (yd/yd) Discharge Instruction: Apply Kerlix as directed Compression Wrap Compression Stockings Add-Ons Electronic Signature(s) Signed: 05/25/2022 4:00:18 PM By: [name] Signed: 05/27/2022 3:39:08 PM By: [name], BSN, [name], CWS, [name], BSN Entered By: [name] on 05/25/2022 14:59:02 [name], [name] ([id number]) -------------------------------------------------------------------------------- Wound Assessment Details Patient Name: Heather Dear A. Date of Service: 05/25/2022 2:15 PM Medical Record Number: [id number] Patient Account Number: [id number] Date of Birth/Sex: 1976-02-22 (46 y.o. F) Treating [name]: [name] Primary Care Taeja Debellis: [name] Other Clinician: Massie [name] Referring Shondale Quinley: [name] Treating Anhad Sheeley/Extender: [name] in Treatment: 10 Wound Status Wound  Number: 12 Primary Diabetic Wound/Ulcer of the Lower Extremity Etiology: Wound Location: Left, Medial, Plantar Foot Wound Open Wounding Event: Pressure Injury Status: Date Acquired: 03/16/2020 Comorbid Chronic sinus problems/congestion, Middle ear problems, Weeks Of Treatment: 10 History: Anemia, Chronic Obstructive Pulmonary Disease (COPD), Clustered Wound: No Congestive Heart Failure, Type II Diabetes, End Stage Renal Disease, History of pressure wounds, Neuropathy Photos Wound Measurements Length: (cm) 3 Width: (cm) 1.4 Depth: (cm) 0.3 Area: (cm) 3.299 Volume: (cm) 0.99 % Reduction in Area: 30% % Reduction in Volume: -5.1% Epithelialization: Small (1-33%) Wound Description Classification: Grade 3 Wound Margin: Flat and Intact Exudate Amount: Medium  Exudate Type: Serous Exudate Color: amber Foul Odor After Cleansing: No Slough/Fibrino Yes Wound Bed Granulation Amount: Large (67-100%) Exposed Structure Granulation Quality: Red, Pink Fascia Exposed: No Necrotic Amount: Small (1-33%) Fat Layer (Subcutaneous Tissue) Exposed: Yes Necrotic Quality: Adherent Slough Tendon Exposed: No Muscle Exposed: No Joint Exposed: No Bone Exposed: No Treatment Notes Wound #12 (Foot) Wound Laterality: Plantar, Left, Medial Cleanser Soap and Water Discharge Instruction: Gently cleanse wound with antibacterial soap, rinse and pat dry prior to dressing wounds [name], [name] ([id number]) Peri-Wound Care Topical keystone gel Discharge Instruction: apply very thin layer to wound Primary Dressing Aquacel Extra Hydrofiber Dressing, 4x5 (in/in) Discharge Instruction: placed over keystone gel Secondary Dressing ABD Pad 5x9 (in/in) Discharge Instruction: Cover with ABD pad Secured With Medipore Tape - 17M Medipore H Soft Cloth Surgical Tape, 2x2 (in/yd) Kerlix Roll Sterile or Non-Sterile 6-ply 4.5x4 (yd/yd) Discharge Instruction: Apply Kerlix as directed Compression Wrap Compression  Stockings Add-Ons Electronic Signature(s) Signed: 05/25/2022 4:00:18 PM By: [name] Signed: 05/27/2022 3:39:08 PM By: [name], BSN, [name], CWS, [name], BSN Entered By: [name] on 05/25/2022 14:59:37 [name], Chandrika A. ([id number]) -------------------------------------------------------------------------------- [place] Details Patient Name: Heather Dear A. Date of Service: 05/25/2022 2:15 PM Medical Record Number: [id number] Patient Account Number: [id number] Date of Birth/Sex: [birth year] (46 y.o. F) Treating [name]: [name] Primary Care Hector Taft: [name] Other Clinician: Massie [name] Referring Gust Eugene: [name] Treating Kalynn Declercq/Extender: [name] in Treatment: 10 Vital Signs Time Taken: 14:43 Temperature (F): 98.2 Pulse (bpm): 70 Respiratory Rate (breaths/min): 18 Blood Pressure (mmHg): 119/81 Reference Range: 80 - 120 mg / dl Electronic Signature(s) Signed: 05/25/2022 4:00:18 PM By: [name] Entered By: [name] on 05/25/2022 14:46:37

## 2022-05-27 NOTE — Progress Notes (Signed)
KEMARIA, DEDIC ([id number]) Visit Report for 05/25/2022 Chief Complaint Document Details Patient Name: [name], [name] Date of Service: 05/25/2022 2:15 PM Medical Record Number: [id number] Patient Account Number: [id number] Date of Birth/Sex: 23-Jun-1976 (46 y.o. F) Treating RN: Cornell Barman Primary Care Provider: Raelene Bott Other Clinician: Massie Kluver Referring Provider: Raelene Bott Treating Provider/Extender: Yaakov Guthrie in Treatment: 10 Information Obtained from: Patient Chief Complaint 03/19/2021; patient referred by Dr. Luana Shu who has been looking after her left foot for quite a period of time for review of a nonhealing area in the left midfoot 03/12/2022; bilateral feet wounds and right lower extremity wound. Electronic Signature(s) Signed: 05/25/2022 4:23:55 PM By: Kalman Shan DO Entered By: Kalman Shan on 05/25/2022 16:13:00 [name] ([id number]) -------------------------------------------------------------------------------- Debridement Details Patient Name: Heather Dear A. Date of Service: 05/25/2022 2:15 PM Medical Record Number: [id number] Patient Account Number: [id number] Date of Birth/Sex: 02-27-1976 (46 y.o. F) Treating RN: Cornell Barman Primary Care Provider: Raelene Bott Other Clinician: Massie Kluver Referring Provider: Raelene Bott Treating Provider/Extender: Yaakov Guthrie in Treatment: 10 Debridement Performed for Wound #12 Left,Medial,Plantar Foot Assessment: Performed By: Physician Kalman Shan, MD Debridement Type: Debridement Severity of Tissue Pre Debridement: Fat layer exposed Level of Consciousness (Pre- Awake and Alert procedure): Pre-procedure Verification/Time Out Yes - 03:06 Taken: Start Time: 03:06 Total Area Debrided (L x W): 3 (cm) x 1.4 (cm) = 4.2 (cm) Tissue and other material Viable, Non-Viable, Callus, Slough, Subcutaneous, Slough debrided: Level: Skin/Subcutaneous Tissue Debridement  Description: Excisional Instrument: Curette Bleeding: Minimum Hemostasis Achieved: Pressure End Time: 03:10 Response to Treatment: Procedure was tolerated well Level of Consciousness (Post- Awake and Alert procedure): Post Debridement Measurements of Total Wound Length: (cm) 3 Width: (cm) 1.4 Depth: (cm) 0.4 Volume: (cm) 1.319 Character of Wound/Ulcer Post Debridement: Stable Severity of Tissue Post Debridement: Fat layer exposed Post Procedure Diagnosis Same as Pre-procedure Electronic Signature(s) Signed: 05/25/2022 4:00:18 PM By: Massie Kluver Signed: 05/25/2022 4:23:55 PM By: Kalman Shan DO Signed: 05/27/2022 3:39:08 PM By: Gretta Cool, BSN, RN, CWS, [name] RN, BSN Entered By: Massie Kluver on 05/25/2022 15:09:24 Heather Dear A. ([id number]) -------------------------------------------------------------------------------- Debridement Details Patient Name: Heather Dear A. Date of Service: 05/25/2022 2:15 PM Medical Record Number: [id number] Patient Account Number: [id number] Date of Birth/Sex: 1975/12/25 (46 y.o. F) Treating RN: Cornell Barman Primary Care Provider: Raelene Bott Other Clinician: Massie Kluver Referring Provider: Raelene Bott Treating Provider/Extender: Yaakov Guthrie in Treatment: 10 Debridement Performed for Wound #11 Right,Plantar Foot Assessment: Performed By: Physician Kalman Shan, MD Debridement Type: Debridement Level of Consciousness (Pre- Awake and Alert procedure): Pre-procedure Verification/Time Out Yes - 15:10 Taken: Start Time: 15:10 Total Area Debrided (L x W): 1.9 (cm) x 0.7 (cm) = 1.33 (cm) Tissue and other material Viable, Non-Viable, Callus, Slough, Subcutaneous, Slough debrided: Level: Skin/Subcutaneous Tissue Debridement Description: Excisional Instrument: Curette Bleeding: Minimum Hemostasis Achieved: Pressure End Time: 15:13 Response to Treatment: Procedure was tolerated well Level of Consciousness (Post- Awake  and Alert procedure): Post Debridement Measurements of Total Wound Length: (cm) 1.9 Width: (cm) 0.7 Depth: (cm) 0.6 Volume: (cm) 0.627 Character of Wound/Ulcer Post Debridement: Stable Post Procedure Diagnosis Same as Pre-procedure Electronic Signature(s) Signed: 05/25/2022 4:00:18 PM By: Massie Kluver Signed: 05/25/2022 4:23:55 PM By: Kalman Shan DO Signed: 05/27/2022 3:39:08 PM By: Gretta Cool, BSN, RN, CWS, [name] RN, BSN Entered By: Massie Kluver on 05/25/2022 15:12:13 [name], [name] ([id number]) -------------------------------------------------------------------------------- HPI Details Patient Name: Heather Dear A. Date of Service: 05/25/2022 2:15 PM Medical Record Number: [id number] Patient Account Number: [id number] Date  of Birth/Sex: 03/25/1976 (46 y.o. F) Treating RN: Cornell Barman Primary Care Provider: Raelene Bott Other Clinician: Massie Kluver Referring Provider: Raelene Bott Treating Provider/Extender: Yaakov Guthrie in Treatment: 10 History of Present Illness HPI Description: 01/18/18-She is here for initial evaluation of the left great toe ulcer. She is a poor historian in regards to timeframe in detail. She states approximately 4 weeks ago she lacerated her toe on something in the house. She followed up with her primary care who placed her on Bactrim and ultimately a second dose of Bactrim prior to coming to wound clinic. She states she has been treating the toe with peroxide, Betadine and a Band-Aid. She did not check her blood sugar this morning but checked it yesterday morning it was 327; she is unaware of a recent A1c and there are no current records. She saw Dr. she would've orthopedics last week for an old injury to the left ankle, she states he did not see her toe, nor did she bring it to his attention. She smokes approximately 1 pack cigarettes a day. Her social situation is concerning, she arrives this morning with her mother who appears extremely  intoxicated/under the influence; her mother was asked to leave the room and be monitored by the patient's grandmother. The patient's aunt then accompanied the patient and the room throughout the rest of the appointment. We had a lengthy discussion regarding the deleterious effects of uncontrolled hyperglycemia and smoking as it relates to wound healing and overall health. She was strongly encouraged to decrease her smoking and get her diabetes under better control. She states she is currently on a diet and has cut down her [facility] consumption. The left toe is erythematous, macerated and slightly edematous with malodor present. The edema in her left foot is below her baseline, there is no erythema streaking. We will treat her with Santyl, doxycycline; we have ordered and xray, culture and provided a Peg assist surgical shoe and cultured the wound. 01/25/18-She is here in follow-up evaluation for a left great toe ulcer and presents with an abscess to her suprapubic area. She states her blood sugars remain elevated, feeling "sick" and if levels are below 250, but she is trying. She has made no attempt to decrease her smoking stating that we "can't take away her food in her cigarettes". She has been compliant with offloading using the PEG assist you. She is using Santyl daily. the culture obtained last week grew staph aureus and Enterococcus faecalis; continues on the doxycycline and Augmentin was added on Monday. The suprapubic area has erythema, no femoral variation, purple discoloration, minimal induration, was accessed with a cotton tip applicator with sanguinopurulent drainage, this was cultured, I suspect the current antibiotic treatment will cover and we will not add anything to her current treatment plan. She was advised to go to urgent care or ER with any change in redness, induration or fever. 02/01/18-She is here in follow-up evaluation for left great toe ulcers and a new abdominal abscess  from last week. She was able to use packing until earlier this week, where she "forgot it was there". She states she was feeling ill with GI symptoms last week and was not taking her antibiotic. She states her glucose levels have been predominantly less than 200, with occasional levels between 200-250. She thinks this was contributing to her GI symptoms as they have resolved without intervention. There continues to be significant laceration to left toe, otherwise it clinically looks stable/improved. There is now less  superficial opening to the lateral aspect of the great toe that was residual blister. We will transition to [facility] to all wounds, she will continue her Augmentin. If there is no change or deterioration next week for reculture. 02/08/18-She is here in follow-up evaluation for left great toe ulcer and abdominal ulcer. There is an improvement in both wounds. She has been wrapping her left toe with coban, not by our direction, which has created an area of discoloration to the medial aspect; she has been advised to NOT use coban secondary to her neuropathy. She states her glucose levels have been high over this last week ranging from 200-350, she continues to smoke. She admits to being less compliant with her offloading shoe. We will continue with same treatment plan and she will follow-up next week. 02/15/18-She is here in follow-up evaluation for left great toe ulcer and abdominal ulcer. The abdominal ulcer is epithelialized. The left great toe ulcer is improved and all injury from last week using the Coban wrap is resolved, the lateral ulcer is healed. She admits to noncompliance with wearing offloading shoe and admits to glucose levels being greater than 300 most of the week. She continues to smoke and expresses no desire to quit. There is one area medially that probes deeper than it has historically, erythema to the toe and dorsal foot has consistently waxed and waned. There is no  overt signs of cellulitis or infection but we will culture the wound for any occult infection given the new area of depth and erythema. We will hold off on sensitivities for initiation of antibiotic therapy. 02/22/18-She is here in follow up evaluation for left great toe ulcer. There is overall significant improvement in both wound appearance, erythema and edema with changes made last week. She was not initiated on antibiotic therapy. Culture obtained last week showed oxacillin sensitive staph aureus, sensitive to clindamycin. Clindamycin has been called into the pharmacy but she has been instructed to hold off on initiation secondary to overall clinical improvement and her history of antibiotic intolerance. She has been instructed to contact the clinic with any noted changes/deterioration and the wound, erythema, edema and/or pain. She will follow-up next week. She continues to smoke and her glucose levels remain elevated >250; she admits to compliance with offloading shoe 03/01/18 on evaluation today patient appears to be doing fairly well in regard to her left first toe ulcer. She has been tolerating the dressing changes with the [facility] Dressing without complication and overall this has definitely showed signs of improvement according to records as well is what the patient tells me today. I'm very pleased in that regard. She is having no pain today 03/08/18 She is here for follow up evaluation of a left great toe ulcer. She remains non-compliant with glucose control and smoking cessation; glucose levels consistently >200. She states that she got new shoe inserts/peg assist. She admits to compliance with offloading. Since my last evaluation there is significant improvement. We will switch to prisma at this time and she will follow up next week. She is noted to be tachycardic at this appointment, heart rate 120s; she has a history of heart rate 70-130 according to our records. She admits to extreme  agitation r/t personal issues; she was advised to monitor her heartrate and contact her physician if it does not return to a more normal range (<100). She takes cardizem twice daily. 03/15/18-She is here in follow-up evaluation for left great toe ulcer. She remains noncompliant with glucose  control and smoking cessation. She admits to compliance with wearing offloading shoe. The ulcer is improved/stable and we will continue with the same treatment plan and she will follow-up next week 03/22/18-She is here for evaluation for left great toe ulcer. There continues to be significant improvement despite recurrent hyperglycemia (over 500 yesterday) and she continues to smoke. She has been compliant with offloading and we will continue with same treatment plan and she will follow-up next week. 03/29/18-She is here for evaluation for left great toe ulcer. Despite continuing to smoke and uncontrolled diabetes she continues to improve. She is compliant with offloading shoe. We will continue with the same treatment plan and she will follow-up next week 04/05/18- She is here in follow up evaluation for a left great toe ulcer; she presents with small pustule to left fifth toe (resembles ant bite). She admits to compliance with wearing offloading shoe; continues to smoke or have uncontrolled blood glucose control. There is more callus than usual with evidence of bleeding; she denies known trauma. 04/12/18-She is here for evaluation of left great toe ulcer. Despite noncompliance with glycemic control and smoking she continues to make Dungan, Laquonda A. ([id number]) improvement. She continues to wear offloading shoe. The pustule, that was identified last week, to the left fifth toe is resolved. She will follow-up in 2 weeks 05/03/18-she is seen in follow-up evaluation for a left great toe ulcer. She is compliant with offloading, otherwise noncompliant with glycemic control and smoking. She has plateaued and there is  minimal improvement noted. We will transition to [facility], replaced the insert to her surgical shoe and she will follow-up in one week 05/10/18- She is here in follow up evaluation for a left great toe ulcer. It appears stable despite measurement change. We will continue with same treatment plan and follow up next week. 05/24/18-She is seen in follow-up evaluation for a left great toe ulcer. She remains compliant with offloading, has made significant improvement in her diet, decreasing the amount of sugar/soda. She said her recent A1c was 10.9 which is lower than. She did see a diabetic nutritionist/educator yesterday. She continues to smoke. We will continue with the same treatment plan and she'll follow-up next week. 05/31/18- She is seen in follow-up evaluation for left great toe ulcer. She continues to remain compliant with offloading, continues to make improvement in her diet, increasing her water and decreasing the amount of sugar/soda. She does continue to smoke with no desire to quit. We will apply Prisma to the depth and Hydrofera Blue over. We have not received insurance authorization for oasis. She will follow up next week. 06/07/18-She is seen in follow-up evaluation for left great toe ulcer. It has stalled according to today's measurements although base appears stable. She says she saw a diabetic educator yesterday; her average blood sugars are less than 300 which is an improvement for her. She continues to smoke and states "that's my next step" She continues with water over soda. We will order for xray, culture and reinstate ace wrap compression prior to placing apligraf for next week. She is voicing no complaints or concerns. Her dressing will change to iodoflex over the next week in preparation for apligraf. 06/14/18-She is seen in follow-up evaluation for left great toe ulcer. Plain film x-ray performed last week was negative for osteomyelitis. Wound culture obtained last week grew  strep B and OSSA; she is initiated on keflex and cefdinir today; there is erythema to the toe which could be from ace wrap compression,  she has a history of wrapping too tight and has has been encouraged to maintain ace wraps that we place today. We will hold off on application of apligraf today, will apply next week after antibiotic therapy has been initiated. She admits today that she has resumed taking a shower with her foot/toe submerged in water, she has been reminded to keep foot/toe out of the bath water. She will be seen in follow up next week 06/21/18-she is seen in follow-up evaluation for left great toe ulcer. She is tolerating antibiotic therapy with no GI disturbance. The wound is stable. Apligraf was applied today. She has been decreasing her smoking, only had 4 cigarettes yesterday and 1 today. She continues being more compliant in diabetic diet. She will follow-up next week for evaluation of site, if stable will remove at 2 weeks. 06/28/18- She is here in follow up evalution. Apligraf was placed last week, she states the dressing fell off on Tuesday and she was dressing with hydrofera blue. She is healed and will be discharged from the clinic today. She has been instructed to continue with smoking cessation, continue monitoring glucose levels, offloading for an additional 4 weeks and continue with hydrofera blue for additional two weeks for any possible microscopic opening. Readmission: 08/07/18 on evaluation today patient presents for reevaluation concerning the ulcer of her right great toe. She was previously discharged on 06/28/18 healed. Nonetheless she states that this began to show signs of drainage she subsequently went to her primary care provider. Subsequently an x-ray was performed on 08/01/18 which was negative. The patient was also placed on antibiotics at that time. Fortunately they should have been effective for the infection. Nonetheless she's been experiencing some  improvement but still has a lot of drainage coming from the wound itself. 08/14/18 on evaluation today patient's wound actually does show signs of improvement in regard to the erythema at this point. She has completed the antibiotics. With that being said we did discuss the possibility of placing her in a total contact cast as of today although I think that I may want to give this just a little bit more time to ensure nothing recurrence as far as her infection is concerned. I do not want to put in the cast and risk infection at that time if things are not completely resolved. With that being said she is gonna require some debridement today. 08/21/18 on evaluation today patient actually appears to be doing okay in regard to her toe ulcer. She's been tolerating the dressing changes without complication. With that being said it does appear that she is ready and in fact I think it's appropriate for [place] to go ahead and initiate the total contact cast today. Nonetheless she will require some sharp debridement to prepare the wound for application. Overall I feel like things have been progressing well but we do need to do something to get this to close more readily. 08/24/18 patient seen today for reevaluation after having had the total contact cast applied on Tuesday. She seems to have done very well the wound appears to be doing great and overall I'm pleased with the progress that she's made. There were no abnormal areas of rubbing from the cast on her lower extremity. 08/30/18 on evaluation today patient actually appears to be completely healed in regard to her plantar toe ulcer. She tells me at this point she's been having a lot of issues with the cast. She almost fell a couple of times the state shall the  step of her dog a couple times as well. This is been a very frustrating process for her other nonetheless she has completely healed the wound which is excellent news. Overall there does not appear to be  the evidence of infection at this time which is great news. 09/11/18 evaluation today patient presents for follow-up concerning her great toe ulcer on the left which has unfortunately reopened since I last saw her which was only a couple of weeks ago. Unfortunately she was not able to get in to get the shoe and potentially the AFO that's gonna be necessary due to her left foot drop. She continues with offloading shoe but this is not enough to prevent her from reopening it appears. When we last had her in the total contact cast she did well from a healing standpoint but unfortunately the wound reopened as soon as she came out of the cast within just a couple of weeks. 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 This is also part of the reason why she falls on a regular basis. I just do not believe that is gonna be safe for [place] to reinitiate the total contact cast as last time we had this on she fell 3 times one week which is definitely not normal for her. 09/18/18 upon evaluation today the patient actually appears to be doing about the same in regard to her toe ulcer. She did not contact [employer] as I asked her to even though I had given her the prescription. In fact she actually states that she has no idea where the prescription is. She did apparently call [facility] and they told her that all she needed to do was bring the prescription in order to be able to be seen and work on getting the AFO for her left foot. With all that being said she still does not have an appointment and I'm not sure were things stand that regard. I will give her a new prescription today in order to contact them to get this set up. 09/25/18 on evaluation today patient actually appears to be doing about the same in regard to her toes  ulcer. She does have a small areas which seems to have a lot of callous buildup around the edge of the wound which is going to need sharp debridement today. She still is waiting to be scheduled for evaluation with [facility] for possibility of an AFO. She states there supposed to call her tomorrow to get this set up. Unfortunately it does appear that her foot specifically the toe area is showing signs of erythema. There does not appear to be any systemic infection which is in these good news. ANAAYA, FUSTER A. ([id number]) 10/02/18 on evaluation today patient actually appears to be doing about the same in regard to her toe ulcer. This really has not done too well although it's not significantly larger it's also not significantly smaller. She has been tolerating the dressing changes without complication. She actually has her appointment with [facility] and [employer] tomorrow to hopefully be measured for obtaining and AFO splint. I think this would be helpful preventing this from reoccurring. We had contemplated starting the cast this week although to be honest I am reluctant to do that as she's been having nausea, vomiting, and seizure  activity over the past three days. She has a history of seizures and have been told is nothing that can be done for these. With that being said I do believe that along with the seizures have the nausea vomiting which upon further questioning doesn't seem to be the normal for her and makes me concerned for the possibility of infection or something else going on. I discussed this with the patient and her mother during the office visit today. I do not feel the wound is effective but maybe something else. The responses this was "this just happens to her at times and we don't know why". They did not seem to be interested in going to the hospital to have this checked out further. 10/09/18 on evaluation today patient presents for follow-up concerning her ongoing toe ulcer. She has  been tolerating the dressing changes without complication. Fortunately there does not appear to be any evidence of infection which is great news however I do think that the patient would benefit from going ahead for with the total contact cast. She's actually in a wheelchair today she tells me that she will use her walker if we initiate the cast. I was very specific about the fact that if we were gonna do the cast I wanted to make sure that she was using the walker in order to prevent any falls. She tells me she does not have stairs that she has to traverse on a regular basis at her home. She has not had any seizures since last week again that something that happens to her often she tells me she did talk to [employer] and they said that it may take up to three weeks to get the brace approved for her. Hopefully that will not take that long but nonetheless in the meantime I do think the cast could be of benefit. 10/12/18 on evaluation today patient appears to be doing rather well in regard to her toe ulcer. It's just been a few days and already this is significantly improved both as far as overall appearance and size. Fortunately there's no sign of infection. She is here for her first obligatory cast change. 10/19/18 Seen today for follow up and management of left great toe ulcer. Wound continues to show improvement. Noted small open area with seroussang drainage with palpation. Denies any increased pain or recent fevers during visit. She will continue calcium alginate with offloading shoe. Denies any questions or concerns during visit. 10/26/18 on evaluation today patient appears to be doing about the same as when I last saw her in regard to her wound bed. Fortunately there does not appear to be any signs of infection. Unfortunately she continues to have a breakdown in regard to the toe region any time that she is not in the cast. It takes almost no time at all for this to happen. Nonetheless she still has  not heard anything from the brace being made by [employer] as to when exactly this will be available to her. Fortunately there is no signs of infection at this time. 10/30/18 on evaluation today patient presents for application of the total contact cast as we just received him this morning. Fortunately we are gonna be able to apply this to her today which is great news. She continues to have no significant pain which is good news. Overall I do feel like things have been improving while she was the cast is when she doesn't have a cast that things get worse. She still has not  really heard anything from [place] regarding her brace. 11/02/18 upon evaluation today patient's wound already appears to be doing significantly better which is good news. Fortunately there does not appear to be any signs of infection also good news. Overall I do think the total contact cast as before is helping to heal this area unfortunately it's just not gonna likely keep the area closed and healed without her getting her brace at least. Again the foot drop is a significant issue for her. 11/09/18 on evaluation today patient appears to be doing excellent in regard to her toe ulcer which in fact is completely healed. Fortunately we finally got the situation squared away with the paperwork which was needed to proceed with getting her brace approved by Medicaid. I have filled that out unfortunately that information has been sent to the orthopedic office that I worked at 2 1/2 years ago and not tired Current wound care measures. Fortunately she seems to be doing very well at this time. 11/23/18 on evaluation today patient appears to be doing More Poorly Compared to Last Time I Saw Her. At [facility] She Had Completely Healed. Currently she is continuing to have issues with reopening. She states that she just found out that the brace was approved through Medicaid now she just has to go get measured in order to have this fitted for her and  then made. Subsequently she does not have an appointment for this yet that is going to complicate things we obviously cannot put her back in the cast if we do not have everything measured because they're not gonna be able to measure her foot while she is in the cast. 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 Obviously this is unfortunate and does have me somewhat worried as well. 11/30/18 on evaluation today patient's toe ulcer actually appears to be doing fairly well. The good news is she will be getting her brace in the shoes next week on Wednesday. Hopefully we will be able to get this to heal without having to go back in the cast however she may need the cast in order to get the wound completely heal and then go from there. Fortunately there's no signs of infection at this time. 12/07/18 on evaluation today patient fortunately did receive her brace and she states she could tell this definitely makes her walk better. With that being said she's been having issues with her toe where she noticed yesterday there was a lot of tissue that was loosing off this appears to be much larger than what it was previous. She also states that her leg has been read putting much across the top of her foot just about the ankle although this seems to be receiving somewhat. The total area is still red and appears to be someone infected as best I can tell. She is previously taken Bactrim and that may be a good option for her today as well. We are gonna see what I wound culture shows as well and I think that this is definitely appropriate. With that being said outside of the culture I still need to initiate something in the interim and that's what I'm gonna go ahead and select Bactrim is a good option for her. 12/14/18 on evaluation today patient appears to be doing better in regard to her left great toe ulcer as compared to  last week's evaluation. There's  still some erythema although this is significantly improved which is excellent news. Overall I do believe that she is making good progress is still gonna take some time before she is where I would like her to be from the standpoint of being able to place her back into the total contact cast. Hopefully we will be where we need to be by next week. 12/21/18 on evaluation today patient actually appears to be doing poorly in regard to her toe ulcer. She's been tolerating the dressing changes without complication. Fortunately there's no signs of systemic infection although she does have a lot of drainage from the toe ulcer and this does seem to be causing some issues at this point. She does have erythema on the distal portion of her toe that appears to be likely cellulitis. 12/28/18 on evaluation today patient actually appears to be doing a little better in my pinion in regard to her toe ulcer. With that being said she still does have some evidence of infection at this time and for her culture she had both E. coli as well as enterococcus as organisms noted on evaluation. For that reason I think that though the Keflex likely has treated the E. coli rather well this has really done nothing for the enterococcus. We are going to have to initiate treatment for this specifically. FARIHA, GOTO A. ([id number]) 01/04/19 on evaluation today patient's toe actually appears to be doing better from the standpoint of infection. She currently would like to see about putting the cash back on I think that this is appropriate as long as she takes care of it and keeps it from getting wet. She is gonna have some drainage we can definitely pass this up with Drawtex and alginate to try to prevent as much drainage as possible from causing the problems. With that being said I do want to at least try her with the cast between now and Tuesday. If there any issues we can't continue to use it then I will  discontinue the use of the cast at that point. 01/08/19 on evaluation today patient actually appears to be doing very well as far as her foot ulcer specifically the great toe on the left is concerned. She did have an area of rubbing on the medial aspect of her left ankle which again is from the cast. Fortunately there's no signs of infection at this point in this appears to be a very slight skin breakdown. The patient tells me she felt it rubbing but didn't think it was that bad. Fortunately there is no signs of active infection at this time which is good news. No fevers, chills, nausea, or vomiting noted at this time. 01/15/19 on evaluation today patient actually appears to be doing well in regard to her toe ulcer. Again as previous she seems to do well and she has the cast on which indicates to me that during the time she doesn't have a cast on she's putting way too much pressure on this region. Obviously I think that's gonna be an issue as with the current national emergency concerning the Covid-19 Virus it has been recommended that we discontinue the use of total contact casting by the chief medical officer of our company, Dr. Simona Huh. The reasoning is that if a patient becomes sick and cannot come into have the cast removed they could not just leave this on for an additional two weeks. Obviously the hospitals also do not want to receive patient's who are sick into the emergency  department to potentially contaminate the region and spread the Covid-19 Virus among other sick individuals within the hospital system. Therefore at this point we are suspending the use of total contact cast until the current emergency subsides. This was all discussed with the patient today as well. 01/22/19 on evaluation today patient's wound on her left great toe appears to be doing slightly worse than previously noted last week. She tells me that she has been on this quite a bit in fact she tells me she's been awake for 38  straight hours. This is due to the fact that she's having to care for grandparents because nobody else will. She has been taking care of them for five the last seven days since I've seen her they both have dementia his is from a stroke and her grandmother's was progressive. Nonetheless she states even her mom who knows her condition and situation has only help two of those days to take care of them she's been taking care of the rest. Fortunately there does not appear to be any signs of active infection in regard to her toe at this point although obviously it doesn't look as good as it did previous. I think this is directly related to her not taking off the pressure and friction by way of taking things easy. Though I completely understand what's going on. 01/29/19 on evaluation today patient's tools are actually appears to be showing some signs of improvement today compared to last week's evaluation as far as not necessarily the overall size of the wound but the fact that she has some new skin growth in between the two ends of the wound opening. Overall I feel like she has done well she states that she had a family member give her what sounds to be a CAM walker boot which has been helpful as well. 02/05/19 on evaluation today patient's wound bed actually appears to be doing significantly better in regard to her overall appearance of the size of the wound. With that being said she is still having an issue with offloading efficiently enough to get this to close. Apparently there is some signs of infection at this point as well unfortunately. Previously she's done well of Augmentin I really do not see anything that needs to be culture currently but there theme and cellulitis of the foot that I'm seeing I'm gonna go ahead and place her on an antibiotic today to try to help clear this up. 02/12/2019 on evaluation today patient actually appears to be doing poorly in regard to her overall wound status. She tells  me she has been using her offloading shoe but actually comes in today wearing her tennis shoe with the AFO brace. Again as I previously discussed with her this is really not sufficient to allow the area to heal appropriately. Nonetheless she continues to be somewhat noncompliant and I do wonder based on what she has told my nurse in the past as to whether or not a good portion of this noncompliance may be recreational drug and alcohol related. She has had a history of heroin overdose and this was fairly recently in the past couple of months that have been seeing her. Nonetheless overall I feel like her wound looks significantly worse today compared to what it was previous. She still has significant erythema despite the Augmentin I am not sure that this is an appropriate medication for her infection I am also concerned that the infection may have gone down into her bone. 02/19/19 on  evaluation today patient actually appears to be doing about the same in regard to her toe ulcer. Unfortunately she continues to show signs of bone exposure and infection at this point. There does not appear to be any evidence of worsening of the infection but I'm also not really sure that it's getting significantly better. She is on the Augmentin which should be sufficient for the Staphylococcus aureus infection that she has at this point. With that being said she may need IV antibiotics to more appropriately treat this. We did have a discussion today about hyperbaric option therapy. 02/28/19 on evaluation today patient actually appears to be doing much worse in regard to the wound on her left great toe as compared to even my previous evaluation last week. Unfortunately this seems to be training in a pretty poor direction. Her toe was actually now starting to angle laterally and I can actually see the entire joint area of the proximal portion of the digit where is the distal portion of the digit again is no longer even in  contact with the joint line. Unfortunately there's a lot more necrotic tissue around the edge and the toe appears to be showing signs of becoming gangrenous in my pinion. I'm very concerned about were things stand at this point. She did see infectious disease and they are planning to send in a prescription for Sivextro for her and apparently this has been approved. With that being said I don't think she should avoid taking this but at the same time I'm not sure that it's gonna be sufficient to save her toe at this point. 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 03/05/19 on evaluation today patient actually appears to be doing quite well in regard to her toe all things considering. She still has bone exposed but there appears to be much less your thing on overall the appearance of the wound and the toe itself is dramatically improved. She still does have some issues currently obviously with infection she did see vascular as well and there concerned that her blood flow to the toad. For that reason they are setting up for an angiogram next week. 03/14/19 on evaluation today patient appears to be doing very poor in regard to her toe and specifically in regard to the ulceration and the fact that she's starting to notice the toe was leaning even more towards the lateral aspect and the complete joint is visible on the proximal aspect of the joint. Nonetheless she's also noted a significant odor and the tip of the toe is turning more dark and necrotic appearing. Overall I think she is getting worse not better as far as this is concerned. For that reason I am recommending at this point that she likely needs to be seen for likely amputation. READMISSION 03/19/2021 This is a patient that we cared for in this  clinic for a prolonged period of time in 2019 and 2020 with a left foot and left first toe wound. 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 ([id number]) 04/09/20 by Dr. Luana Shu. In December 2021 she had an ulcer on her right great toe as well as the fourth and  fifth toes. 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 She also had an angiogram at that time and underwent angioplasty of the right anterior tibial artery. In any case she claims that the wound on the right foot is closed I did not look at this today which was probably an oversight although I think that should be done next week. After her surgery she developed a dehiscence but I do not see any follow-up of this. According to Dr. Deborra Medina last review that she was out of the area being cared for by another physician but recently came back to his attention. 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 A culture of this area showed E. coli apparently before she came back to see Dr. Luana Shu she was supposed to be receiving antibiotics but she did not really take them. Nor is she offloading this area at all. Finally her last hemoglobin A1c listed in epic was in March 2022 at 14.1 she says things are a lot better since then although I am not sure. 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 She had this wound at the time. During this admission she had E. coli sepsis a TEE was negative. 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 She is still a heavy  cigarette smoker. She has a history of bipolar disorder seizure disorder ABI in our clinic was 1.05 6/1; left midfoot in the setting of a TMA done previously. Round circular wound with a "knuckle" of protruding tissue. The problem is that the knuckle was not attached to any of the surrounding granulation and this probed proximally widely I removed a large portion of this tissue. This wound goes with considerable undermining laterally. I do not feel any bone there was no purulence but this is a deep wound. 6/8; in spite of the debridement I did last week. She arrives with a wound looking exactly the same. A protruding "knuckle" of tissue nonadherent to most of the surrounding tissue. There is considerable depth around this from 6-12 o'clock at 2.7 cm and undermining of 1 cm. This does not look overtly infected and the x-ray I did last week was negative for any osseous abnormalities. We have been using silver collagen 6/15; deep tissue culture I did last week showed moderate staph aureus and moderate Pseudomonas. This will definitely require prolonged antibiotic therapy. The pathology on the protuberant area was negative for malignancy fungus etc. 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 We have been using silver collagen. I am going to be prescribing Levaquin for 2 weeks[address] Her CT scan of the foot is down for 7/5 6/22; CT scan of the foot on 7 5. She says she has hardware in the left leg from her previous fracture. She is on the Levaquin for the deep tissue culture I did that showed methicillin sensitive staph aureus and Pseudomonas. I gave her a 2-week supply and she will have another week. She arrives in clinic today with the same protuberant tissue however this is nonadherent to the tissue surrounding it. I am really at a loss to explain this unless there is underlying deep tissue infection 6/29; patient presents for  1 week follow-up. She  has been using collagen to the wound bed. She reports taking her antibiotics as prescribed.She has no complaints or issues today. She denies signs of infection. 7/6; patient presents for one week followup. She has been using collagen to the wound bed. She states she is taking Levaquin however at times she is not able to keep it down. She denies signs of infection. 7/13; patient presents for 1 week follow-up. She has been using silver alginate to the wound bed. She still has nausea when taking her antibiotics. She denies signs of infection. 7/20; patient presents for 1 week follow-up. She has been using silver alginate with gentamicin cream to the wound bed. She denies any issues and has no complaints today. She denies signs of infection. 7/27; patient presents for 1 week follow-up. She continues to use silver alginate with gentamicin cream to the wound bed. She reports starting her antibiotics. She has no issues or complaints. Overall she reports stability to the wound. 8/3; patient presents for 1 week follow-up. She has been using silver alginate with gentamicin cream to the wound bed. She reports completing all antibiotics. She has no issues or complaints today. She denies signs of infection. 8/17; patient presents for 2-week follow-up. He is to use silver alginate to the wound bed. She has no issues or complaints today. She denies signs of infection. She reports her pain has improved in her foot since last clinic visit 8/24; patient presents for 1 week follow-up. She continues to use silver alginate to the wound bed. She has no issues or complaints. She denies signs of infection. Pain is stable. 9/7; patient presents for follow-up. She missed her last week appointment due to feeling ill. She continues to use silver alginate. She has a new wound to the right lower extremity that is covered in eschar. She states It occurred over the past week and has no idea how it started. She currently denies  signs of infection. 9/14; patient presents for follow-up. To the left foot wound she has been using gentamicin cream and silver alginate. To the right lower extremity wound she has been keeping this covered and has not obtain Santyl. 9/21; patient presents for follow-up. She reports using gentamicin cream and silver alginate to the left foot and Santyl to the right lower extremity wound. She has no issues or complaints today. She denies signs of infection. 9/28; patient presents for follow-up. She reports a new wound to her right heel. She states this occurred a few days ago and is progressively gotten worse. She has been trying to clean the area with a Q-tip and Santyl. She reports stability in the other 2 wounds. She has been using gentamicin cream and silver alginate to the left foot and Santyl to the right lower extremity wound. 10/12; patient presents for follow-up. She reports improvement to the wound beds. She is seeing vein and vascular to discuss the potential of a left BKA. She states they are going to do an arteriogram. She continues to use silver alginate with dressing changes to her wounds. 11/2; patient presents for follow-up. She states she has not been doing dressing changes to the wound beds. She states she is not able to offload the areas. She reports chronic pain to her left foot wound. 11/9; patient presents for follow-up. She came in with only socks on. She states she forgot to put on shoes. It is unclear if she is doing any dressing changes. She currently denies systemic signs of  infection. 11/16; patient presents for follow-up. She came again only with socks on. She states she does not wear shoes ever. It is unclear if she does dressing changes. She currently denies systemic signs of infection. 11/23; patient presents for follow-up. She wore her shoes today. It still unclear exactly what dressing she is using for each wound but she did states she obtained Dakin's solution and  has been using this to the left foot wound. She currently denies signs of infection. 11/30; patient presents for follow-up. She has no issues or complaints today. She currently denies signs of infection. 12/7; patient presents for follow-up. She has no issues or complaints today. She has been using Hydrofera Blue to the right heel wound and Dakin solution to the left foot wound. Her right anterior leg wound is healed. She currently denies signs of infection. ILEENE, ALLIE A. ([id number]) 12/14; patient presents for follow-up. She has been using Hydrofera Blue to the right heel and Dakin's to the left foot wounds. She has no issues or complaints today. She denies signs of infection. 12/21; patient presents for follow-up. She reports using Hydrofera Blue to the right heel and Dakin's to the left foot wound. She denies signs of infection. 12/28; patient presents for follow-up. She continues to use Dakin's to the left foot wound and Hydrofera Blue to the right heel wound. She denies signs of infection. 1/4; patient presents for follow-up. She has no issues or complaints today. She denies signs of infection. 1/11; patient presents for follow-up. It is unclear if she has been dressing these wounds over the past week. She currently denies signs of infection. 1/18; patient presents for follow-up. She states she has been using Dakin's wet-to-dry dressings to the left foot. She has been using Hydrofera Blue to the right foot foot wound. She states that the anterior right leg wound has reopened and draining serous fluid. She denies signs of infection. 1/25; patient presents for follow-up. She has no issues or complaints today. 2/1; patient presents for follow-up. She has no issues or complaints today. She denies signs of infection. 2/8; patient presents for follow-up. She has lost her surgical shoes. She did not have a dressing to the right heel wound. She currently denies signs of infection. 2/15; patient  presents for follow-up. She reports more pain to the right heel today. She denies purulent drainage Or fever/chills 2/22; patient presents for follow-up. She reports taking clindamycin over the past week. She states that she continues to have pain to her right heel. She reports purulent drainage. Readmission 03/16/2022 Ms. Elora Wolter is a 46 year old female with a past medical history of type 2 diabetes, osteomyelitis to her feet, chronic systolic heart failure and bipolar disorder that presents to the clinic for bilateral feet wounds and right lower extremity wound. She was last seen in our clinic on 12/15/2021. At that time she had purulent drainage coming out of her right plantar foot and I recommended she go to the ED. She states she went to [facility] and has been there for the past 3 months. I cannot see the records. She states she had OR debridement and was on several weeks of IV antibiotics while inpatient. Since discharge she has not been taking care of the wound beds. She had nothing on her feet other than socks today. She currently denies signs of infection. 5/31; patient presents for follow-up. She has been using Dakin's wet-to-dry dressings to the wound beds on her feet bilaterally and antibiotic ointment to the  right anterior leg wound. She had a wound culture done at last clinic visit that showed moderate Pseudomonas aeruginosa sensitive to ciprofloxacin. She currently denies systemic signs of infection. 6/14; patient presents for follow-up. She received Keystone 5 days ago and has been using this on the wound beds. She states that last week she had to go to the hospital because she had increased warmth and erythema to the right foot. She was started on 2 oral antibiotics. She states she has been taking these. She currently denies systemic signs of infection. She has no issues or complaints today. 6/21; patient presents for follow-up. She states she has been using  Keystone antibiotics to the wound beds. She has no issues or complaints today. She denies signs of infection. 6/28; patient presents for follow-up. She has been using Keystone antibiotics to the wound beds. She has no issues or complaints today. 7/12; patient presents for follow-up. Has been using Keystone antibiotics to the wound beds with calcium alginate. She has no issues or complaints today. She never followed up with her orthopedic surgeon who did the OR debridement to the right foot. We discussed the total contact cast for the left foot and patient would like to do this next week. 7/19; patient presents for follow-up. She has been using Keystone antibiotics with calcium alginate to the wound beds. She has no issues or complaints today. Patient is in agreement to do the total contact cast of the left foot today. She knows to return later this week for the obligatory cast change. 05-13-2022 upon evaluation today patient's wound which she has the cast of the left leg actually appears to be doing significantly better. Fortunately I do not see any signs of active infection locally or systemically which is great news and overall I am extremely pleased with where we stand currently. 7/26; patient presents for follow-up. She has a cast in place for the past week. She states it irritated her shin. Other than that she tolerated the cast well. She states she would like a break for 1 week from the cast. We have been using Keystone antibiotic and Aquacel to both wound beds. She denies signs of infection. 8/2; patient presents for follow-up. She has been using Keystone and Aquacel to the wound beds. She denies any issues and has no complaints. She is agreeable to have the cast placed today for the left leg. Electronic Signature(s) Signed: 05/25/2022 4:23:55 PM By: Kalman Shan DO Entered By: Kalman Shan on 05/25/2022 16:13:30 [name]  ([id number]) -------------------------------------------------------------------------------- Physical Exam Details Patient Name: Heather Dear A. Date of Service: 05/25/2022 2:15 PM Medical Record Number: [id number] Patient Account Number: [id number] Date of Birth/Sex: February 15, 1976 (46 y.o. F) Treating RN: Cornell Barman Primary Care Provider: Raelene Bott Other Clinician: Massie Kluver Referring Provider: Raelene Bott Treating Provider/Extender: Yaakov Guthrie in Treatment: 10 Constitutional . Cardiovascular . Psychiatric . Notes Right foot: To the plantar heel there is an incision site with increased depth. Nonviable tissue at the surface, callus present. No probing to bone. Left foot: To the medial aspect there is an open wound with granulation tissue, nonviable tissue and circumferential callus. No surrounding signs of infection to any of the wound beds. Electronic Signature(s) Signed: 05/25/2022 4:23:55 PM By: Kalman Shan DO Entered By: Kalman Shan on 05/25/2022 16:13:53 [name] ([id number]) -------------------------------------------------------------------------------- Physician Orders Details Patient Name: Heather Dear A. Date of Service: 05/25/2022 2:15 PM Medical Record Number: [id number] Patient Account Number: [id number] Date of Birth/Sex: 1975-10-29 (46 y.o. F)  Treating RN: Cornell Barman Primary Care Provider: Raelene Bott Other Clinician: Massie Kluver Referring Provider: Raelene Bott Treating Provider/Extender: Yaakov Guthrie in Treatment: 10 Verbal / Phone Orders: No Diagnosis Coding Follow-up Appointments o Return Appointment in 1 week. o Nurse Visit as needed [employer] o Wash wounds with antibacterial soap and water. o No tub bath. Anesthetic (Use 'Patient Medications' Section for Anesthetic Order Entry) o Lidocaine applied to wound bed Edema Control - Lymphedema / Segmental Compressive Device /  Other o Elevate, Exercise Daily and Avoid Standing for Long Periods of Time. o Elevate legs to the level of the heart and pump ankles as often as possible o Elevate leg(s) parallel to the floor when sitting. Off-Loading o Total Contact Cast to Left Lower Extremity - contact cast #1 applied Don't get cast wet o Other: - keep pressure off of feet. Medications-Please add to medication list. o Keystone Compound - Keystone gel Wound Treatment Wound #11 - Foot Wound Laterality: Plantar, Right Cleanser: Soap and Water 1 x Per Day/30 Days Discharge Instructions: Gently cleanse wound with antibacterial soap, rinse and pat dry prior to dressing wounds Topical: keystone gel 1 x Per Day/30 Days Primary Dressing: Aquacel Extra Hydrofiber Dressing, 4x5 (in/in) (Generic) 1 x Per Day/30 Days Discharge Instructions: packed into wound Secondary Dressing: ABD Pad 5x9 (in/in) (Generic) 1 x Per Day/30 Days Discharge Instructions: Cover with ABD pad Secured With: Medipore Tape - 16M Medipore H Soft Cloth Surgical Tape, 2x2 (in/yd) (Generic) 1 x Per Day/30 Days Secured With: Kerlix Roll Sterile or Non-Sterile 6-ply 4.5x4 (yd/yd) (Generic) 1 x Per Day/30 Days Discharge Instructions: Apply Kerlix as directed Wound #12 - Foot Wound Laterality: Plantar, Left, Medial Cleanser: Soap and Water 1 x Per Day/30 Days Discharge Instructions: Gently cleanse wound with antibacterial soap, rinse and pat dry prior to dressing wounds Topical: keystone gel 1 x Per Day/30 Days Discharge Instructions: apply very thin layer to wound Primary Dressing: Aquacel Extra Hydrofiber Dressing, 4x5 (in/in) (Generic) 1 x Per Day/30 Days Discharge Instructions: placed over keystone gel Secondary Dressing: ABD Pad 5x9 (in/in) (Generic) 1 x Per Day/30 Days Discharge Instructions: Cover with ABD pad Cashatt, Lovenia A. ([id number]) Secured With: Medipore Tape - 16M Medipore H Soft Cloth Surgical Tape, 2x2 (in/yd) (Generic) 1 x Per  Day/30 Days Secured With: Kerlix Roll Sterile or Non-Sterile 6-ply 4.5x4 (yd/yd) (Generic) 1 x Per Day/30 Days Discharge Instructions: Apply Kerlix as directed Electronic Signature(s) Signed: 05/25/2022 4:23:55 PM By: Kalman Shan DO Previous Signature: 05/25/2022 4:00:18 PM Version By: Massie Kluver Entered By: Kalman Shan on 05/25/2022 16:15:29 Vanbuskirk, Zailynn A. ([id number]) -------------------------------------------------------------------------------- Problem List Details Patient Name: Heather Dear A. Date of Service: 05/25/2022 2:15 PM Medical Record Number: [id number] Patient Account Number: [id number] Date of Birth/Sex: [birth year] (46 y.o. 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 03/16/2022 No Yes exposed E11.621 Type 2 diabetes mellitus with foot ulcer 03/16/2022 No Yes E11.42 Type 2 diabetes mellitus with diabetic polyneuropathy 03/16/2022 No Yes L97.811 Non-pressure chronic ulcer of other part of right lower leg limited to 03/16/2022 No Yes breakdown of skin Inactive Problems Resolved Problems Electronic Signature(s) Signed: 05/25/2022 4:23:55 PM By: Kalman Shan  DO Entered By: Kalman Shan on 05/25/2022 16:12:56 Brossart, Kristiane AMarland Kitchen ([id number]) -------------------------------------------------------------------------------- Progress Note Details Patient Name: CARCIONE, Kylia A. Date of Service: 05/25/2022 2:15 PM Medical Record Number: [id number] Patient Account Number: [id number] Date of Birth/Sex: [birth year] (46 y.o. F) Treating RN: Cornell Barman Primary Care Provider: Raelene Bott Other Clinician: Massie Kluver Referring Provider: Raelene Bott Treating Provider/Extender: Yaakov Guthrie in Treatment: 10 Subjective Chief Complaint Information obtained from Patient 03/19/2021; patient referred by Dr. Luana Shu who has been looking after her left foot for quite a period of time for review of a nonhealing area in the left midfoot 03/12/2022; bilateral feet wounds and right lower extremity wound. History of Present Illness (HPI) 01/18/18-She is here for initial evaluation of the left great toe ulcer. She is a poor historian in regards to timeframe in detail. She states approximately 4 weeks ago she lacerated her toe on something in the house. She followed up with her primary care who placed her on Bactrim and ultimately a second dose of Bactrim prior to coming to wound clinic. She states she has been treating the toe with peroxide, Betadine and a Band-Aid. She did not check her blood sugar this morning but checked it yesterday morning it was 327; she is unaware of a recent A1c and there are no current records. She saw Dr. she would've orthopedics last week for an old injury to the left ankle, she states he did not see her toe, nor did she bring it to his attention. She smokes approximately 1 pack cigarettes a day. Her social situation is concerning, she arrives this morning with her mother who appears extremely intoxicated/under the influence; her mother was asked to leave the room and be monitored by the patient's grandmother. The patient's aunt then accompanied the patient and the room throughout the rest of the appointment. We had a lengthy discussion regarding the deleterious effects of uncontrolled hyperglycemia and smoking as it relates to wound healing and overall health. She was strongly encouraged to decrease her smoking and get her diabetes under better control. She states she is currently on a diet and has cut down her  [facility] consumption. The left toe is erythematous, macerated and slightly edematous with malodor present. The edema in her left foot is below her baseline, there is no erythema streaking. We will treat her with Santyl, doxycycline; we have ordered and xray, culture and provided a Peg assist surgical shoe and cultured the wound. 01/25/18-She is here in follow-up evaluation for a left great toe ulcer and presents with an abscess to her suprapubic area. She states her blood sugars remain elevated, feeling "sick" and if levels are below 250, but she is trying. She has made no attempt to decrease her smoking stating that we "can't take away her food in her cigarettes". She has been compliant with offloading using the PEG assist you. She is using Santyl daily. the culture obtained last week grew staph aureus and Enterococcus faecalis; continues on the doxycycline and Augmentin was added on Monday. The suprapubic area has erythema, no femoral variation, purple discoloration, minimal induration, was accessed with a cotton tip applicator with sanguinopurulent drainage, this was cultured, I suspect the current antibiotic treatment will cover and we will not add anything to her current treatment plan. She was advised to go to urgent care or ER with any change in redness, induration or fever. 02/01/18-She is here in follow-up evaluation for left great toe ulcers and a new abdominal abscess from last week. She  was able to use packing until earlier this week, where she "forgot it was there". She states she was feeling ill with GI symptoms last week and was not taking her antibiotic. She states her glucose levels have been predominantly less than 200, with occasional levels between 200-250. She thinks this was contributing to her GI symptoms as they have resolved without intervention. There continues to be significant laceration to left toe, otherwise it clinically looks stable/improved. There is now less  superficial opening to the lateral aspect of the great toe that was residual blister. We will transition to [facility] to all wounds, she will continue her Augmentin. If there is no change or deterioration next week for reculture. 02/08/18-She is here in follow-up evaluation for left great toe ulcer and abdominal ulcer. There is an improvement in both wounds. She has been wrapping her left toe with coban, not by our direction, which has created an area of discoloration to the medial aspect; she has been advised to NOT use coban secondary to her neuropathy. She states her glucose levels have been high over this last week ranging from 200-350, she continues to smoke. She admits to being less compliant with her offloading shoe. We will continue with same treatment plan and she will follow-up next week. 02/15/18-She is here in follow-up evaluation for left great toe ulcer and abdominal ulcer. The abdominal ulcer is epithelialized. The left great toe ulcer is improved and all injury from last week using the Coban wrap is resolved, the lateral ulcer is healed. She admits to noncompliance with wearing offloading shoe and admits to glucose levels being greater than 300 most of the week. She continues to smoke and expresses no desire to quit. There is one area medially that probes deeper than it has historically, erythema to the toe and dorsal foot has consistently waxed and waned. There is no overt signs of cellulitis or infection but we will culture the wound for any occult infection given the new area of depth and erythema. We will hold off on sensitivities for initiation of antibiotic therapy. 02/22/18-She is here in follow up evaluation for left great toe ulcer. There is overall significant improvement in both wound appearance, erythema and edema with changes made last week. She was not initiated on antibiotic therapy. Culture obtained last week showed oxacillin sensitive staph aureus, sensitive to  clindamycin. Clindamycin has been called into the pharmacy but she has been instructed to hold off on initiation secondary to overall clinical improvement and her history of antibiotic intolerance. She has been instructed to contact the clinic with any noted changes/deterioration and the wound, erythema, edema and/or pain. She will follow-up next week. She continues to smoke and her glucose levels remain elevated >250; she admits to compliance with offloading shoe 03/01/18 on evaluation today patient appears to be doing fairly well in regard to her left first toe ulcer. She has been tolerating the dressing changes with the [facility] Dressing without complication and overall this has definitely showed signs of improvement according to records as well is what the patient tells me today. I'm very pleased in that regard. She is having no pain today 03/08/18 She is here for follow up evaluation of a left great toe ulcer. She remains non-compliant with glucose control and smoking cessation; glucose levels consistently >200. She states that she got new shoe inserts/peg assist. She admits to compliance with offloading. Since my last evaluation there is significant improvement. We will switch to prisma at this time and  she will follow up next week. She is noted to be tachycardic at this appointment, heart rate 120s; she has a history of heart rate 70-130 according to our records. She admits to extreme agitation r/t personal issues; she was advised to monitor her heartrate and contact her physician if it does not return to a more normal range (<100). She takes cardizem twice daily. 03/15/18-She is here in follow-up evaluation for left great toe ulcer. She remains noncompliant with glucose control and smoking cessation. She admits to compliance with wearing offloading shoe. The ulcer is improved/stable and we will continue with the same treatment plan and she will follow-up next week 03/22/18-She is here for  evaluation for left great toe ulcer. There continues to be significant improvement despite recurrent hyperglycemia (over 500 yesterday) and she continues to smoke. She has been compliant with offloading and we will continue with same treatment plan and she will follow-up next week. Isakson, Aamilah A. ([id number]) 03/29/18-She is here for evaluation for left great toe ulcer. Despite continuing to smoke and uncontrolled diabetes she continues to improve. She is compliant with offloading shoe. We will continue with the same treatment plan and she will follow-up next week 04/05/18- She is here in follow up evaluation for a left great toe ulcer; she presents with small pustule to left fifth toe (resembles ant bite). She admits to compliance with wearing offloading shoe; continues to smoke or have uncontrolled blood glucose control. There is more callus than usual with evidence of bleeding; she denies known trauma. 04/12/18-She is here for evaluation of left great toe ulcer. Despite noncompliance with glycemic control and smoking she continues to make improvement. She continues to wear offloading shoe. The pustule, that was identified last week, to the left fifth toe is resolved. She will follow-up in 2 weeks 05/03/18-she is seen in follow-up evaluation for a left great toe ulcer. She is compliant with offloading, otherwise noncompliant with glycemic control and smoking. She has plateaued and there is minimal improvement noted. We will transition to [facility], replaced the insert to her surgical shoe and she will follow-up in one week 05/10/18- She is here in follow up evaluation for a left great toe ulcer. It appears stable despite measurement change. We will continue with same treatment plan and follow up next week. 05/24/18-She is seen in follow-up evaluation for a left great toe ulcer. She remains compliant with offloading, has made significant improvement in her diet, decreasing the amount of sugar/soda.  She said her recent A1c was 10.9 which is lower than. She did see a diabetic nutritionist/educator yesterday. She continues to smoke. We will continue with the same treatment plan and she'll follow-up next week. 05/31/18- She is seen in follow-up evaluation for left great toe ulcer. She continues to remain compliant with offloading, continues to make improvement in her diet, increasing her water and decreasing the amount of sugar/soda. She does continue to smoke with no desire to quit. We will apply Prisma to the depth and Hydrofera Blue over. We have not received insurance authorization for oasis. She will follow up next week. 06/07/18-She is seen in follow-up evaluation for left great toe ulcer. It has stalled according to today's measurements although base appears stable. She says she saw a diabetic educator yesterday; her average blood sugars are less than 300 which is an improvement for her. She continues to smoke and states "that's my next step" She continues with water over soda. We will order for xray, culture and reinstate ace wrap  compression prior to placing apligraf for next week. She is voicing no complaints or concerns. Her dressing will change to iodoflex over the next week in preparation for apligraf. 06/14/18-She is seen in follow-up evaluation for left great toe ulcer. Plain film x-ray performed last week was negative for osteomyelitis. Wound culture obtained last week grew strep B and OSSA; she is initiated on keflex and cefdinir today; there is erythema to the toe which could be from ace wrap compression, she has a history of wrapping too tight and has has been encouraged to maintain ace wraps that we place today. We will hold off on application of apligraf today, will apply next week after antibiotic therapy has been initiated. She admits today that she has resumed taking a shower with her foot/toe submerged in water, she has been reminded to keep foot/toe out of the bath water. She  will be seen in follow up next week 06/21/18-she is seen in follow-up evaluation for left great toe ulcer. She is tolerating antibiotic therapy with no GI disturbance. The wound is stable. Apligraf was applied today. She has been decreasing her smoking, only had 4 cigarettes yesterday and 1 today. She continues being more compliant in diabetic diet. She will follow-up next week for evaluation of site, if stable will remove at 2 weeks. 06/28/18- She is here in follow up evalution. Apligraf was placed last week, she states the dressing fell off on Tuesday and she was dressing with hydrofera blue. She is healed and will be discharged from the clinic today. She has been instructed to continue with smoking cessation, continue monitoring glucose levels, offloading for an additional 4 weeks and continue with hydrofera blue for additional two weeks for any possible microscopic opening. Readmission: 08/07/18 on evaluation today patient presents for reevaluation concerning the ulcer of her right great toe. She was previously discharged on 06/28/18 healed. Nonetheless she states that this began to show signs of drainage she subsequently went to her primary care provider. Subsequently an x-ray was performed on 08/01/18 which was negative. The patient was also placed on antibiotics at that time. Fortunately they should have been effective for the infection. Nonetheless she's been experiencing some improvement but still has a lot of drainage coming from the wound itself. 08/14/18 on evaluation today patient's wound actually does show signs of improvement in regard to the erythema at this point. She has completed the antibiotics. With that being said we did discuss the possibility of placing her in a total contact cast as of today although I think that I may want to give this just a little bit more time to ensure nothing recurrence as far as her infection is concerned. I do not want to put in the cast and risk  infection at that time if things are not completely resolved. With that being said she is gonna require some debridement today. 08/21/18 on evaluation today patient actually appears to be doing okay in regard to her toe ulcer. She's been tolerating the dressing changes without complication. With that being said it does appear that she is ready and in fact I think it's appropriate for [place] to go ahead and initiate the total contact cast today. Nonetheless she will require some sharp debridement to prepare the wound for application. Overall I feel like things have been progressing well but we do need to do something to get this to close more readily. 08/24/18 patient seen today for reevaluation after having had the total contact cast applied on Tuesday. She  seems to have done very well the wound appears to be doing great and overall I'm pleased with the progress that she's made. There were no abnormal areas of rubbing from the cast on her lower extremity. 08/30/18 on evaluation today patient actually appears to be completely healed in regard to her plantar toe ulcer. She tells me at this point she's been having a lot of issues with the cast. She almost fell a couple of times the state shall the step of her dog a couple times as well. This is been a very frustrating process for her other nonetheless she has completely healed the wound which is excellent news. Overall there does not appear to be the evidence of infection at this time which is great news. 09/11/18 evaluation today patient presents for follow-up concerning her great toe ulcer on the left which has unfortunately reopened since I last saw her which was only a couple of weeks ago. Unfortunately she was not able to get in to get the shoe and potentially the AFO that's gonna be necessary due to her left foot drop. She continues with offloading shoe but this is not enough to prevent her from reopening it appears. When we last had her in the total  contact cast she did well from a healing standpoint but unfortunately the wound reopened as soon as she came out of the cast within just a couple of weeks. 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 This is also part of the reason why she falls on a regular basis. I just do not believe that is gonna be safe for [place] to reinitiate the total contact cast as last time we had this on she fell 3 times one week which is definitely not normal for her. 09/18/18 upon evaluation today the patient actually appears to be doing about the same in regard to her toe ulcer. She did not contact [facility] as I asked her to even though I had given her the prescription. In fact she actually states that she has no idea where the prescription is. She did apparently call [facility] and they told her that all she needed to do was bring the prescription in order to be able to be seen and work on getting the AFO for her left foot. With all that being said she still does not have an appointment and I'm not sure were things stand that regard. I will give her a new prescription today in order to contact them to get this set up. ANDA, SOBOTTA A. ([id number]) 09/25/18 on evaluation today patient actually appears to be doing about the same in regard to her toes ulcer. She does have a small areas which seems to have a lot of callous buildup around the edge of the wound which is going to need sharp debridement today. She still is waiting to be scheduled for evaluation with [facility] for possibility of an AFO. She states there supposed to call her tomorrow to get this set up. Unfortunately it does appear that her foot specifically the toe area is showing signs of erythema. There does not appear to be any systemic infection which is in these good  news. 10/02/18 on evaluation today patient actually appears to be doing about the same in regard to her toe  ulcer. This really has not done too well although it's not significantly larger it's also not significantly smaller. She has been tolerating the dressing changes without complication. She actually has her appointment with [facility] and [place] tomorrow to hopefully be measured for obtaining and AFO splint. I think this would be helpful preventing this from reoccurring. We had contemplated starting the cast this week although to be honest I am reluctant to do that as she's been having nausea, vomiting, and seizure activity over the past three days. She has a history of seizures and have been told is nothing that can be done for these. With that being said I do believe that along with the seizures have the nausea vomiting which upon further questioning doesn't seem to be the normal for her and makes me concerned for the possibility of infection or something else going on. I discussed this with the patient and her mother during the office visit today. I do not feel the wound is effective but maybe something else. The responses this was "this just happens to her at times and we don't know why". They did not seem to be interested in going to the hospital to have this checked out further. 10/09/18 on evaluation today patient presents for follow-up concerning her ongoing toe ulcer. She has been tolerating the dressing changes without complication. Fortunately there does not appear to be any evidence of infection which is great news however I do think that the patient would benefit from going ahead for with the total contact cast. She's actually in a wheelchair today she tells me that she will use her walker if we initiate the cast. I was very specific about the fact that if we were gonna do the cast I wanted to make sure that she was using the walker in order to prevent any falls. She tells me she does  not have stairs that she has to traverse on a regular basis at her home. She has not had any seizures since last week again that something that happens to her often she tells me she did talk to [employer] and they said that it may take up to three weeks to get the brace approved for her. Hopefully that will not take that long but nonetheless in the meantime I do think the cast could be of benefit. 10/12/18 on evaluation today patient appears to be doing rather well in regard to her toe ulcer. It's just been a few days and already this is significantly improved both as far as overall appearance and size. Fortunately there's no sign of infection. She is here for her first obligatory cast change. 10/19/18 Seen today for follow up and management of left great toe ulcer. Wound continues to show improvement. Noted small open area with seroussang drainage with palpation. Denies any increased pain or recent fevers during visit. She will continue calcium alginate with offloading shoe. Denies any questions or concerns during visit. 10/26/18 on evaluation today patient appears to be doing about the same as when I last saw her in regard to her wound bed. Fortunately there does not appear to be any signs of infection. Unfortunately she continues to have a breakdown in regard to the toe region any time that she is not in the cast. It takes almost no time at all for this to happen. Nonetheless she still has not heard anything from the brace being made by [employer] as to when exactly this will be available to her. Fortunately there is no signs  of infection at this time. 10/30/18 on evaluation today patient presents for application of the total contact cast as we just received him this morning. Fortunately we are gonna be able to apply this to her today which is great news. She continues to have no significant pain which is good news. Overall I do feel like things have been improving while she was the cast is when she doesn't  have a cast that things get worse. She still has not really heard anything from [place] regarding her brace. 11/02/18 upon evaluation today patient's wound already appears to be doing significantly better which is good news. Fortunately there does not appear to be any signs of infection also good news. Overall I do think the total contact cast as before is helping to heal this area unfortunately it's just not gonna likely keep the area closed and healed without her getting her brace at least. Again the foot drop is a significant issue for her. 11/09/18 on evaluation today patient appears to be doing excellent in regard to her toe ulcer which in fact is completely healed. Fortunately we finally got the situation squared away with the paperwork which was needed to proceed with getting her brace approved by Medicaid. I have filled that out unfortunately that information has been sent to the orthopedic office that I worked at 2 1/2 years ago and not tired Current wound care measures. Fortunately she seems to be doing very well at this time. 11/23/18 on evaluation today patient appears to be doing More Poorly Compared to Last Time I Saw Her. At [facility] She Had Completely Healed. Currently she is continuing to have issues with reopening. She states that she just found out that the brace was approved through Medicaid now she just has to go get measured in order to have this fitted for her and then made. Subsequently she does not have an appointment for this yet that is going to complicate things we obviously cannot put her back in the cast if we do not have everything measured because they're not gonna be able to measure her foot while she is in the cast. 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 Obviously this is unfortunate and does have me somewhat worried as well. 11/30/18 on evaluation today patient's toe  ulcer actually appears to be doing fairly well. The good news is she will be getting her brace in the shoes next week on Wednesday. Hopefully we will be able to get this to heal without having to go back in the cast however she may need the cast in order to get the wound completely heal and then go from there. Fortunately there's no signs of infection at this time. 12/07/18 on evaluation today patient fortunately did receive her brace and she states she could tell this definitely makes her walk better. With that being said she's been having issues with her toe where she noticed yesterday there was a lot of tissue that was loosing off this appears to be much larger than what it was previous. She also states that her leg has been read putting much across the top of her foot just about the ankle although this seems to be receiving somewhat. The total area is still red and appears to be someone infected as best I can tell. She is previously taken Bactrim and that may be a good option for  her today as well. We are gonna see what I wound culture shows as well and I think that this is definitely appropriate. With that being said outside of the culture I still need to initiate something in the interim and that's what I'm gonna go ahead and select Bactrim is a good option for her. 12/14/18 on evaluation today patient appears to be doing better in regard to her left great toe ulcer as compared to last week's evaluation. There's still some erythema although this is significantly improved which is excellent news. Overall I do believe that she is making good progress is still gonna take some time before she is where I would like her to be from the standpoint of being able to place her back into the total contact cast. Hopefully we will be where we need to be by next week. 12/21/18 on evaluation today patient actually appears to be doing poorly in regard to her toe ulcer. She's been tolerating the dressing  changes without complication. Fortunately there's no signs of systemic infection although she does have a lot of drainage from the toe ulcer and this does Proch, Kaylani A. ([id number]) seem to be causing some issues at this point. She does have erythema on the distal portion of her toe that appears to be likely cellulitis. 12/28/18 on evaluation today patient actually appears to be doing a little better in my pinion in regard to her toe ulcer. With that being said she still does have some evidence of infection at this time and for her culture she had both E. coli as well as enterococcus as organisms noted on evaluation. For that reason I think that though the Keflex likely has treated the E. coli rather well this has really done nothing for the enterococcus. We are going to have to initiate treatment for this specifically. 01/04/19 on evaluation today patient's toe actually appears to be doing better from the standpoint of infection. She currently would like to see about putting the cash back on I think that this is appropriate as long as she takes care of it and keeps it from getting wet. She is gonna have some drainage we can definitely pass this up with Drawtex and alginate to try to prevent as much drainage as possible from causing the problems. With that being said I do want to at least try her with the cast between now and Tuesday. If there any issues we can't continue to use it then I will discontinue the use of the cast at that point. 01/08/19 on evaluation today patient actually appears to be doing very well as far as her foot ulcer specifically the great toe on the left is concerned. She did have an area of rubbing on the medial aspect of her left ankle which again is from the cast. Fortunately there's no signs of infection at this point in this appears to be a very slight skin breakdown. The patient tells me she felt it rubbing but didn't think it was that bad. Fortunately there is no signs of  active infection at this time which is good news. No fevers, chills, nausea, or vomiting noted at this time. 01/15/19 on evaluation today patient actually appears to be doing well in regard to her toe ulcer. Again as previous she seems to do well and she has the cast on which indicates to me that during the time she doesn't have a cast on she's putting way too much pressure on this region. Obviously I think that's  gonna be an issue as with the current national emergency concerning the Covid-19 Virus it has been recommended that we discontinue the use of total contact casting by the chief medical officer of our company, Dr. Simona Huh. The reasoning is that if a patient becomes sick and cannot come into have the cast removed they could not just leave this on for an additional two weeks. Obviously the hospitals also do not want to receive patient's who are sick into the emergency department to potentially contaminate the region and spread the Covid-19 Virus among other sick individuals within the hospital system. Therefore at this point we are suspending the use of total contact cast until the current emergency subsides. This was all discussed with the patient today as well. 01/22/19 on evaluation today patient's wound on her left great toe appears to be doing slightly worse than previously noted last week. She tells me that she has been on this quite a bit in fact she tells me she's been awake for 38 straight hours. This is due to the fact that she's having to care for grandparents because nobody else will. She has been taking care of them for five the last seven days since I've seen her they both have dementia his is from a stroke and her grandmother's was progressive. Nonetheless she states even her mom who knows her condition and situation has only help two of those days to take care of them she's been taking care of the rest. Fortunately there does not appear to be any signs of active infection in regard  to her toe at this point although obviously it doesn't look as good as it did previous. I think this is directly related to her not taking off the pressure and friction by way of taking things easy. Though I completely understand what's going on. 01/29/19 on evaluation today patient's tools are actually appears to be showing some signs of improvement today compared to last week's evaluation as far as not necessarily the overall size of the wound but the fact that she has some new skin growth in between the two ends of the wound opening. Overall I feel like she has done well she states that she had a family member give her what sounds to be a CAM walker boot which has been helpful as well. 02/05/19 on evaluation today patient's wound bed actually appears to be doing significantly better in regard to her overall appearance of the size of the wound. With that being said she is still having an issue with offloading efficiently enough to get this to close. Apparently there is some signs of infection at this point as well unfortunately. Previously she's done well of Augmentin I really do not see anything that needs to be culture currently but there theme and cellulitis of the foot that I'm seeing I'm gonna go ahead and place her on an antibiotic today to try to help clear this up. 02/12/2019 on evaluation today patient actually appears to be doing poorly in regard to her overall wound status. She tells me she has been using her offloading shoe but actually comes in today wearing her tennis shoe with the AFO brace. Again as I previously discussed with her this is really not sufficient to allow the area to heal appropriately. Nonetheless she continues to be somewhat noncompliant and I do wonder based on what she has told my nurse in the past as to whether or not a good portion of this noncompliance may be recreational drug  and alcohol related. She has had a history of heroin overdose and this was fairly recently  in the past couple of months that have been seeing her. Nonetheless overall I feel like her wound looks significantly worse today compared to what it was previous. She still has significant erythema despite the Augmentin I am not sure that this is an appropriate medication for her infection I am also concerned that the infection may have gone down into her bone. 02/19/19 on evaluation today patient actually appears to be doing about the same in regard to her toe ulcer. Unfortunately she continues to show signs of bone exposure and infection at this point. There does not appear to be any evidence of worsening of the infection but I'm also not really sure that it's getting significantly better. She is on the Augmentin which should be sufficient for the Staphylococcus aureus infection that she has at this point. With that being said she may need IV antibiotics to more appropriately treat this. We did have a discussion today about hyperbaric option therapy. 02/28/19 on evaluation today patient actually appears to be doing much worse in regard to the wound on her left great toe as compared to even my previous evaluation last week. Unfortunately this seems to be training in a pretty poor direction. Her toe was actually now starting to angle laterally and I can actually see the entire joint area of the proximal portion of the digit where is the distal portion of the digit again is no longer even in contact with the joint line. Unfortunately there's a lot more necrotic tissue around the edge and the toe appears to be showing signs of becoming gangrenous in my pinion. I'm very concerned about were things stand at this point. She did see infectious disease and they are planning to send in a prescription for Sivextro for her and apparently this has been approved. With that being said I don't think she should avoid taking this but at the same time I'm not sure that it's gonna be sufficient to save her toe at this  point. 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 03/05/19 on evaluation today patient actually appears to be doing quite well in regard to her toe all things considering. She still has bone exposed but there appears to be much less your thing on overall the appearance of the wound and the toe itself is dramatically improved. She still does have some issues currently obviously with infection she did see vascular as well and there concerned that her blood flow to the toad. For that reason they are setting up for an angiogram next week. 03/14/19 on evaluation today patient appears to be doing very poor in regard to her toe and specifically in regard to the ulceration and the fact that she's starting to notice the toe was leaning even more towards the lateral aspect and the complete joint is visible on the proximal aspect of the joint. Nonetheless she's also noted a significant odor and the tip of the toe is turning more dark and necrotic appearing. Overall I think she is getting worse not better as far as this is concerned. For that reason I am recommending at this point that she likely needs to be seen for likely amputation. Heuberger, Brittnae  A. ([id number]) READMISSION 03/19/2021 This is a patient that we cared for in this clinic for a prolonged period of time in 2019 and 2020 with a left foot and left first toe wound. 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 Luana Shu. In December 2021 she had an ulcer on her right great toe as well as the fourth and fifth toes. 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 She also had an angiogram at that time and  underwent angioplasty of the right anterior tibial artery. In any case she claims that the wound on the right foot is closed I did not look at this today which was probably an oversight although I think that should be done next week. After her surgery she developed a dehiscence but I do not see any follow-up of this. According to Dr. Deborra Medina last review that she was out of the area being cared for by another physician but recently came back to his attention. 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 A culture of this area showed E. coli apparently before she came back to see Dr. Luana Shu she was supposed to be receiving antibiotics but she did not really take them. Nor is she offloading this area at all. Finally her last hemoglobin A1c listed in epic was in March 2022 at 14.1 she says things are a lot better since then although I am not sure. 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 She had this wound at the time. During this admission she had E. coli sepsis a TEE was negative. 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 She is still a heavy cigarette smoker. She has a history of bipolar disorder seizure disorder ABI in our clinic was 1.05 6/1; left midfoot in the setting of a TMA done previously. Round circular wound with a "knuckle" of protruding tissue. The problem is that the knuckle was not attached to any of the surrounding granulation and this probed proximally widely I removed a large portion of this tissue. This wound goes with considerable undermining laterally. I do not feel any bone there was no purulence but this is a deep wound. 6/8; in  spite of the debridement I did last week. She arrives with a wound looking exactly the same. A protruding "knuckle" of tissue nonadherent to most of the surrounding tissue. There is considerable depth around this from 6-12 o'clock at 2.7 cm and undermining of 1 cm. This does not look overtly infected and the x-ray I did last week was negative for any osseous abnormalities. We have been using silver collagen 6/15; deep tissue culture I did last week showed moderate staph aureus and moderate Pseudomonas. This will definitely require prolonged antibiotic therapy. The pathology on the protuberant area was negative for malignancy fungus etc. 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 We have been using silver collagen. I am going to be prescribing Levaquin for 2 weeks. Her CT scan of the foot is down for 7/5 6/22; CT scan of the foot on 7 5. She says  she has hardware in the left leg from her previous fracture. She is on the Levaquin for the deep tissue culture I did that showed methicillin sensitive staph aureus and Pseudomonas. I gave her a 2-week supply and she will have another week. She arrives in clinic today with the same protuberant tissue however this is nonadherent to the tissue surrounding it. I am really at a loss to explain this unless there is underlying deep tissue infection 6/29; patient presents for 1 week follow-up. She has been using collagen to the wound bed. She reports taking her antibiotics as prescribed.She has no complaints or issues today. She denies signs of infection. 7/6; patient presents for one week followup. She has been using collagen to the wound bed. She states she is taking Levaquin however at times she is not able to keep it down. She denies signs of infection. 7/13; patient presents for 1 week follow-up. She has been using silver alginate to the wound bed. She still has nausea when taking her antibiotics. She  denies signs of infection. 7/20; patient presents for 1 week follow-up. She has been using silver alginate with gentamicin cream to the wound bed. She denies any issues and has no complaints today. She denies signs of infection. 7/27; patient presents for 1 week follow-up. She continues to use silver alginate with gentamicin cream to the wound bed. She reports starting her antibiotics. She has no issues or complaints. Overall she reports stability to the wound. 8/3; patient presents for 1 week follow-up. She has been using silver alginate with gentamicin cream to the wound bed. She reports completing all antibiotics. She has no issues or complaints today. She denies signs of infection. 8/17; patient presents for 2-week follow-up. He is to use silver alginate to the wound bed. She has no issues or complaints today. She denies signs of infection. She reports her pain has improved in her foot since last clinic visit 8/24; patient presents for 1 week follow-up. She continues to use silver alginate to the wound bed. She has no issues or complaints. She denies signs of infection. Pain is stable. 9/7; patient presents for follow-up. She missed her last week appointment due to feeling ill. She continues to use silver alginate. She has a new wound to the right lower extremity that is covered in eschar. She states It occurred over the past week and has no idea how it started. She currently denies signs of infection. 9/14; patient presents for follow-up. To the left foot wound she has been using gentamicin cream and silver alginate. To the right lower extremity wound she has been keeping this covered and has not obtain Santyl. 9/21; patient presents for follow-up. She reports using gentamicin cream and silver alginate to the left foot and Santyl to the right lower extremity wound. She has no issues or complaints today. She denies signs of infection. 9/28; patient presents for follow-up. She reports a new  wound to her right heel. She states this occurred a few days ago and is progressively gotten worse. She has been trying to clean the area with a Q-tip and Santyl. She reports stability in the other 2 wounds. She has been using gentamicin cream and silver alginate to the left foot and Santyl to the right lower extremity wound. 10/12; patient presents for follow-up. She reports improvement to the wound beds. She is seeing vein and vascular to discuss the potential of a left BKA. She states they are going to do an arteriogram. She continues to  use silver alginate with dressing changes to her wounds. 11/2; patient presents for follow-up. She states she has not been doing dressing changes to the wound beds. She states she is not able to offload the areas. She reports chronic pain to her left foot wound. 11/9; patient presents for follow-up. She came in with only socks on. She states she forgot to put on shoes. It is unclear if she is doing any dressing changes. She currently denies systemic signs of infection. 11/16; patient presents for follow-up. She came again only with socks on. She states she does not wear shoes ever. It is unclear if she does dressing changes. She currently denies systemic signs of infection. 11/23; patient presents for follow-up. She wore her shoes today. It still unclear exactly what dressing she is using for each wound but she did states she obtained Dakin's solution and has been using this to the left foot wound. She currently denies signs of infection. BRAELIN, BROSCH ([id number]) 11/30; patient presents for follow-up. She has no issues or complaints today. She currently denies signs of infection. 12/7; patient presents for follow-up. She has no issues or complaints today. She has been using Hydrofera Blue to the right heel wound and Dakin solution to the left foot wound. Her right anterior leg wound is healed. She currently denies signs of infection. 12/14; patient presents  for follow-up. She has been using Hydrofera Blue to the right heel and Dakin's to the left foot wounds. She has no issues or complaints today. She denies signs of infection. 12/21; patient presents for follow-up. She reports using Hydrofera Blue to the right heel and Dakin's to the left foot wound. She denies signs of infection. 12/28; patient presents for follow-up. She continues to use Dakin's to the left foot wound and Hydrofera Blue to the right heel wound. She denies signs of infection. 1/4; patient presents for follow-up. She has no issues or complaints today. She denies signs of infection. 1/11; patient presents for follow-up. It is unclear if she has been dressing these wounds over the past week. She currently denies signs of infection. 1/18; patient presents for follow-up. She states she has been using Dakin's wet-to-dry dressings to the left foot. She has been using Hydrofera Blue to the right foot foot wound. She states that the anterior right leg wound has reopened and draining serous fluid. She denies signs of infection. 1/25; patient presents for follow-up. She has no issues or complaints today. 2/1; patient presents for follow-up. She has no issues or complaints today. She denies signs of infection. 2/8; patient presents for follow-up. She has lost her surgical shoes. She did not have a dressing to the right heel wound. She currently denies signs of infection. 2/15; patient presents for follow-up. She reports more pain to the right heel today. She denies purulent drainage Or fever/chills 2/22; patient presents for follow-up. She reports taking clindamycin over the past week. She states that she continues to have pain to her right heel. She reports purulent drainage. Readmission 03/16/2022 Ms. Eve Rey is a 46 year old female with a past medical history of type 2 diabetes, osteomyelitis to her feet, chronic systolic heart failure and bipolar disorder that presents to the clinic  for bilateral feet wounds and right lower extremity wound. She was last seen in our clinic on 12/15/2021. At that time she had purulent drainage coming out of her right plantar foot and I recommended she go to the ED. She states she went to [facility] and  has been there for the past 3 months. I cannot see the records. She states she had OR debridement and was on several weeks of IV antibiotics while inpatient. Since discharge she has not been taking care of the wound beds. She had nothing on her feet other than socks today. She currently denies signs of infection. 5/31; patient presents for follow-up. She has been using Dakin's wet-to-dry dressings to the wound beds on her feet bilaterally and antibiotic ointment to the right anterior leg wound. She had a wound culture done at last clinic visit that showed moderate Pseudomonas aeruginosa sensitive to ciprofloxacin. She currently denies systemic signs of infection. 6/14; patient presents for follow-up. She received Keystone 5 days ago and has been using this on the wound beds. She states that last week she had to go to the hospital because she had increased warmth and erythema to the right foot. She was started on 2 oral antibiotics. She states she has been taking these. She currently denies systemic signs of infection. She has no issues or complaints today. 6/21; patient presents for follow-up. She states she has been using Keystone antibiotics to the wound beds. She has no issues or complaints today. She denies signs of infection. 6/28; patient presents for follow-up. She has been using Keystone antibiotics to the wound beds. She has no issues or complaints today. 7/12; patient presents for follow-up. Has been using Keystone antibiotics to the wound beds with calcium alginate. She has no issues or complaints today. She never followed up with her orthopedic surgeon who did the OR debridement to the right foot. We discussed the  total contact cast for the left foot and patient would like to do this next week. 7/19; patient presents for follow-up. She has been using Keystone antibiotics with calcium alginate to the wound beds. She has no issues or complaints today. Patient is in agreement to do the total contact cast of the left foot today. She knows to return later this week for the obligatory cast change. 05-13-2022 upon evaluation today patient's wound which she has the cast of the left leg actually appears to be doing significantly better. Fortunately I do not see any signs of active infection locally or systemically which is great news and overall I am extremely pleased with where we stand currently. 7/26; patient presents for follow-up. She has a cast in place for the past week. She states it irritated her shin. Other than that she tolerated the cast well. She states she would like a break for 1 week from the cast. We have been using Keystone antibiotic and Aquacel to both wound beds. She denies signs of infection. 8/2; patient presents for follow-up. She has been using Keystone and Aquacel to the wound beds. She denies any issues and has no complaints. She is agreeable to have the cast placed today for the left leg. NAJAH, LIVERMAN A. ([id number]) Objective Constitutional Vitals Time Taken: 2:43 PM, Temperature: 98.2 F, Pulse: 70 bpm, Respiratory Rate: 18 breaths/min, Blood Pressure: 119/81 mmHg. General Notes: Right foot: To the plantar heel there is an incision site with increased depth. Nonviable tissue at the surface, callus present. No probing to bone. Left foot: To the medial aspect there is an open wound with granulation tissue, nonviable tissue and circumferential callus. No surrounding signs of infection to any of the wound beds. Integumentary (Hair, Skin) Wound #11 status is Open. Original cause of wound was Surgical Injury. The date acquired was: 12/01/2021. The wound has been in treatment  10 weeks. The  wound is located on the [place]. The wound measures 1.9cm length x 0.7cm width x 0.6cm depth; 1.045cm^2 area and 0.627cm^3 volume. There is Fat Layer (Subcutaneous Tissue) exposed. There is a medium amount of serosanguineous drainage noted. The wound margin is distinct with the outline attached to the wound base. There is medium (34-66%) pink, pale granulation within the wound bed. There is a medium (34-66%) amount of necrotic tissue within the wound bed including Adherent Slough. Wound #12 status is Open. Original cause of wound was Pressure Injury. The date acquired was: 03/16/2020. The wound has been in treatment 10 weeks. The wound is located on the [place]. The wound measures 3cm length x 1.4cm width x 0.3cm depth; 3.299cm^2 area and 0.99cm^3 volume. There is Fat Layer (Subcutaneous Tissue) exposed. There is a medium amount of serous drainage noted. The wound margin is flat and intact. There is large (67-100%) red, pink granulation within the wound bed. There is a small (1-33%) amount of necrotic tissue within the wound bed including Adherent Slough. 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 I debrided nonviable tissue. I recommended continuing calcium alginate with Keystone to the wound beds. The total contact cast was placed in standard fashion to the left leg. Follow-up in 1 week. Procedures Wound #11 Pre-procedure diagnosis of Wound #11 is an Open Surgical Wound located on the Right,Plantar Foot . There was a Excisional Skin/Subcutaneous Tissue Debridement with a total area of 1.33 sq cm performed  by Kalman Shan, MD. With the following instrument(s): Curette to remove Viable and Non-Viable tissue/material. Material removed includes Callus, Subcutaneous Tissue, and Slough. A time out was conducted at 15:10, prior to the start of the procedure. A Minimum amount of bleeding was controlled with Pressure. The procedure was tolerated well. Post Debridement Measurements: 1.9cm length x 0.7cm width x 0.6cm depth; 0.627cm^3 volume. Character of Wound/Ulcer Post Debridement is stable. 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 There was a Excisional Skin/Subcutaneous Tissue Debridement with a total area of 4.2 sq cm performed by Kalman Shan, MD. With the following instrument(s): Curette to remove Viable and Non-Viable tissue/material. Material removed includes Callus, Subcutaneous Tissue, and Slough. A time out was conducted at 03:06, prior to the start of the procedure. A Minimum amount of bleeding was controlled with Pressure. The procedure was tolerated well. Post Debridement Measurements: 3cm length x 1.4cm width x 0.4cm depth; 1.319cm^3 volume. Character of Wound/Ulcer Post Debridement is stable. Severity of Tissue Post Debridement is: Fat layer exposed. 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 There was a Total Contact Cast Procedure by Kalman Shan, MD. [name] ([id number]) Post procedure Diagnosis Wound #12: Same as Pre-Procedure Notes: total contact cast applied to left lower leg #1. Plan Follow-up Appointments: Return Appointment in 1 week. Nurse Visit as needed Bathing/ Shower/ Hygiene: Wash wounds with  antibacterial soap and water.  No tub bath. Anesthetic (Use 'Patient Medications' Section for Anesthetic Order Entry): Lidocaine applied to wound bed Edema Control - Lymphedema / Segmental Compressive Device / Other: Elevate, Exercise Daily and Avoid Standing for Long Periods of Time. Elevate legs to the level of the heart and pump ankles as often as possible Elevate leg(s) parallel to the floor when sitting. Off-Loading: Total Contact Cast to Left Lower Extremity - contact cast #1 applied Don't get cast wet Other: - keep pressure off of feet. Medications-Please add to medication list.: [employer] - Keystone gel WOUND #11: - Foot Wound Laterality: Plantar, Right Cleanser: Soap and Water 1 x Per Day/30 Days Discharge Instructions: Gently cleanse wound with antibacterial soap, rinse and pat dry prior to dressing wounds Topical: keystone gel 1 x Per Day/30 Days Primary Dressing: Aquacel Extra Hydrofiber Dressing, 4x5 (in/in) (Generic) 1 x Per Day/30 Days Discharge Instructions: packed into wound Secondary Dressing: ABD Pad 5x9 (in/in) (Generic) 1 x Per Day/30 Days Discharge Instructions: Cover with ABD pad Secured With: Medipore Tape - 17M Medipore H Soft Cloth Surgical Tape, 2x2 (in/yd) (Generic) 1 x Per Day/30 Days Secured With: Kerlix Roll Sterile or Non-Sterile 6-ply 4.5x4 (yd/yd) (Generic) 1 x Per Day/30 Days Discharge Instructions: Apply Kerlix as directed WOUND #12: - Foot Wound Laterality: Plantar, Left, Medial Cleanser: Soap and Water 1 x Per Day/30 Days Discharge Instructions: Gently cleanse wound with antibacterial soap, rinse and pat dry prior to dressing wounds Topical: keystone gel 1 x Per Day/30 Days Discharge Instructions: apply very thin layer to wound Primary Dressing: Aquacel Extra Hydrofiber Dressing, 4x5 (in/in) (Generic) 1 x Per Day/30 Days Discharge Instructions: placed over keystone gel Secondary Dressing: ABD Pad 5x9 (in/in) (Generic) 1 x Per Day/30  Days Discharge Instructions: Cover with ABD pad Secured With: Medipore Tape - 17M Medipore H Soft Cloth Surgical Tape, 2x2 (in/yd) (Generic) 1 x Per Day/30 Days Secured With: Kerlix Roll Sterile or Non-Sterile 6-ply 4.5x4 (yd/yd) (Generic) 1 x Per Day/30 Days Discharge Instructions: Apply Kerlix as directed 1. In office sharp debridement 2. Keystone antibiotic and calcium alginate 3. Total contact cast to the left leg 4. Follow-up in 1 week Electronic Signature(s) Signed: 05/25/2022 4:23:55 PM By: Kalman Shan DO Entered By: Kalman Shan on 05/25/2022 16:15:03 Whitehead, [place] ([id number]) -------------------------------------------------------------------------------- Total Contact Cast Details Patient Name: Heather Dear A. Date of Service: 05/25/2022 2:15 PM Medical Record Number: [id number] Patient Account Number: [id number] Date of Birth/Sex: 1976/07/03 (46 y.o. F) Treating RN: Cornell Barman Primary Care Provider: Raelene Bott Other Clinician: Massie Kluver Referring Provider: Raelene Bott Treating Provider/Extender: Yaakov Guthrie in Treatment: 10 Total Contact Cast Applied for Wound Assessment: Wound #12 Left,Medial,Plantar Foot Performed By: Physician Kalman Shan, MD Post Procedure Diagnosis Same as Pre-procedure Notes total contact cast applied to left lower leg #1 Electronic Signature(s) Signed: 05/25/2022 4:00:18 PM By: Massie Kluver Signed: 05/25/2022 4:23:55 PM By: Kalman Shan DO Entered By: Massie Kluver on 05/25/2022 15:10:17 Ackman, Jalesia A. ([id number]) -------------------------------------------------------------------------------- SuperBill Details Patient Name: Heather Dear A. Date of Service: 05/25/2022 Medical Record Number: [id number] Patient Account Number: [id number] Date of Birth/Sex: 1976/01/09 (46 y.o. F) Treating RN: Cornell Barman Primary Care Provider: Raelene Bott Other Clinician: Massie Kluver Referring Provider: Raelene Bott Treating Provider/Extender: Yaakov Guthrie in Treatment: 10 Diagnosis Coding ICD-10 Codes Code Description [phone] 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ity 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.621 Type 2 diabetes mellitus  with foot ulcer E11.42 Type 2 diabetes mellitus with diabetic polyneuropathy L97.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 of skin Facility Procedures CPT4 Code: [id number] Description: 65826 - DEB SUBQ TISSUE 20 SQ CM/< Modifier: Quantity: 1 CPT4 Code: Description: ICD-10 Diagnosis Description 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 L97.512 Non-pressure chronic ulcer of other part of right foot with fat layer expo Modifier: severity sed Quantity: Physician Procedures CPT4 Code: 0888358 Description: 11042 - WC PHYS SUBQ TISS 20 SQ CM Modifier: Quantity: 1 CPT4 Code: Description: ICD-10 Diagnosis Description 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 L97.512 Non-pressure chronic ulcer of other part of right foot with fat layer expo Modifier: severity sed Quantity: Electronic Signature(s) Signed: 05/25/2022 4:23:55 PM By: Kalman Shan DO Entered By: Kalman Shan on 05/25/2022 16:15:16

## 2022-06-01 ENCOUNTER — Ambulatory Visit: Payer: Medicaid Other | Admitting: Internal Medicine

## 2022-06-03 ENCOUNTER — Encounter: Payer: Medicaid Other | Admitting: Physician Assistant

## 2022-06-03 DIAGNOSIS — E11621 Type 2 diabetes mellitus with foot ulcer: Secondary | ICD-10-CM | POA: Diagnosis not present

## 2022-06-03 NOTE — Progress Notes (Signed)
HUXLEY, VANWAGONER ([id number]) Visit Report for 06/03/2022 Chief Complaint Document Details Patient Name: [name], [name] Date of Service: 06/03/2022 10:30 AM Medical Record Number: [id number] Patient Account Number: [id number] Date of Birth/Sex: 29-Sep-1976 (46 y.o. F) Treating RN: Cornell Barman Primary Care Provider: Raelene Bott Other Clinician: Massie Kluver Referring Provider: Raelene Bott Treating Provider/Extender: Skipper Cliche in Treatment: 11 Information Obtained from: Patient Chief Complaint 03/19/2021; patient referred by Dr. Luana Shu who has been looking after her left foot for quite a period of time for review of a nonhealing area in the left midfoot 03/12/2022; bilateral feet wounds and right lower extremity wound. Electronic Signature(s) Signed: 06/03/2022 10:56:45 AM By: Worthy Keeler PA-C Entered By: Worthy Keeler on 06/03/2022 10:56:45 [name] ([id number]) -------------------------------------------------------------------------------- Problem List Details Patient Name: Heather Dear A. Date of Service: 06/03/2022 10:30 AM Medical Record Number: [id number] Patient Account Number: [id number] Date of Birth/Sex: 25-Jul-1976 (46 y.o. F) Treating RN: Cornell Barman Primary Care Provider: Raelene Bott Other Clinician: Massie Kluver Referring Provider: Raelene Bott Treating Provider/Extender: Skipper Cliche in Treatment: 11 Active Problems ICD-10 Encounter Code Description 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/2022 No Yes severity L97.512 Non-pressure chronic ulcer of other part of right foot with fat layer 03/16/2022 No Yes exposed E11.621 Type 2 diabetes mellitus with foot ulcer 03/16/2022 No Yes E11.42 Type 2 diabetes mellitus with diabetic polyneuropathy 03/16/2022 No Yes L97.811 Non-pressure chronic ulcer of other part of right lower leg limited to 03/16/2022 No  Yes breakdown of skin Inactive Problems Resolved Problems Electronic Signature(s) Signed: 06/03/2022 10:56:41 AM By: Worthy Keeler PA-C Entered By: Worthy Keeler on 06/03/2022 10:56:41

## 2022-06-08 ENCOUNTER — Encounter (HOSPITAL_BASED_OUTPATIENT_CLINIC_OR_DEPARTMENT_OTHER): Payer: Medicaid Other | Admitting: Internal Medicine

## 2022-06-08 DIAGNOSIS — L97512 Non-pressure chronic ulcer of other part of right foot with fat layer exposed: Secondary | ICD-10-CM

## 2022-06-08 DIAGNOSIS — L97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ity: Secondary | ICD-10-CM

## 2022-06-08 DIAGNOSIS — E11621 Type 2 diabetes mellitus with foot ulcer: Secondary | ICD-10-CM | POA: Diagnosis not present

## 2022-06-09 NOTE — Progress Notes (Signed)
[name], [name] ([id number]) Visit Report for 06/03/2022 Arrival Information Details Patient Name: [name], [name] Date of Service: 06/03/2022 10:30 AM Medical Record Number: [id number] Patient Account Number: [id number] Date of Birth/Sex: 1976-04-05 (46 y.o. F) Treating RN: Cornell Barman Primary Care Azariah Latendresse: Raelene Bott Other Clinician: Massie Kluver Referring Svea Pusch: Raelene Bott Treating Rowene Suto/Extender: Skipper Cliche in Treatment: 11 Visit Information History Since Last Visit All ordered tests and consults were completed: No Patient Arrived: Wheel Chair Added or deleted any medications: No Arrival Time: 10:35 Any new allergies or adverse reactions: No Transfer Assistance: EasyPivot Patient Lift Had a fall or experienced change in No activities of daily living that may affect risk of falls: Hospitalized since last visit: No Pain Present Now: Yes Electronic Signature(s) Signed: 06/03/2022 4:28:06 PM By: Massie Kluver Entered By: Massie Kluver on 06/03/2022 10:37:34 [name] ([id number]) -------------------------------------------------------------------------------- Clinic Level of Care Assessment Details Patient Name: Heather Dear A. Date of Service: 06/03/2022 10:30 AM Medical Record Number: [id number] Patient Account Number: [id number] Date of Birth/Sex: 1976-02-03 (46 y.o. F) Treating RN: Cornell Barman Primary Care Pamla Pangle: Raelene Bott Other Clinician: Massie Kluver Referring Kaileen Bronkema: Raelene Bott Treating Beonka Amesquita/Extender: Skipper Cliche in Treatment: 11 Clinic Level of Care Assessment Items TOOL 1 Quantity Score _0  - Use when EandM and Procedure is performed on INITIAL visit 0 ASSESSMENTS - Nursing Assessment / Reassessment _1  - General Physical Exam (combine w/ comprehensive assessment (listed just below) when performed on new 0 pt. evals) _2  - 0 Comprehensive Assessment (HX, ROS, Risk Assessments, Wounds Hx, etc.) ASSESSMENTS -  Wound and Skin Assessment / Reassessment _3  - Dermatologic / Skin Assessment (not related to wound area) 0 ASSESSMENTS - Ostomy and/or Continence Assessment and Care _4  - Incontinence Assessment and Management 0 _5  - 0 Ostomy Care Assessment and Management (repouching, etc.) PROCESS - Coordination of Care _6  - Simple Patient / Family Education for ongoing care 0 _7  - 0 Complex (extensive) Patient / Family Education for ongoing care _8  - 0 Staff obtains Programmer, systems, Records, Test Results / Process Orders _9  - 0 Staff telephones HHA, Nursing Homes / Clarify orders / etc _10  - 0 Routine Transfer to another Facility (non-emergent condition) _11  - 0 Routine Hospital Admission (non-emergent condition) _12  - 0 New Admissions / Biomedical engineer / Ordering NPWT, Apligraf, etc. _13  - 0 Emergency Hospital Admission (emergent condition) PROCESS - Special Needs _14  - Pediatric / Minor Patient Management 0 _15  - 0 Isolation Patient Management _16  - 0 Hearing / Language / Visual special needs _17  - 0 Assessment of Community assistance (transportation, D/C planning, etc.) _18  - 0 Additional assistance / Altered mentation _19  - 0 Support Surface(s) Assessment (bed, cushion, seat, etc.) INTERVENTIONS - Miscellaneous _20  - External ear exam 0 _21  - 0 Patient Transfer (multiple staff / Civil Service fast streamer / Similar devices) _22  - 0 Simple Staple / Suture removal (25 or less) _23  - 0 Complex Staple / Suture removal (26 or more) _24  - 0 Hypo/Hyperglycemic Management (do not check if billed separately) _25  - 0 Ankle / Brachial Index (ABI) - do not check if billed separately Has the patient been seen at the hospital within the last three years: Yes Total Score: 0 Level Of Care: ____ [name] ([id number]) Electronic Signature(s) Signed: 06/03/2022 4:28:06 PM By: Massie Kluver Entered By: Massie Kluver on 06/03/2022 11:25:08 [name], [name]  ([id number]) -------------------------------------------------------------------------------- Lower Extremity Assessment Details Patient Name: Heather Dear A. Date of Service: 06/03/2022 10:30 AM Medical Record Number: [id number] Patient Account Number:  [id number] Date of Birth/Sex: [birth year] (46 y.o. F) Treating RN: Cornell Barman Primary Care Laylonie Marzec: Raelene Bott Other Clinician: Massie Kluver Referring Annahi Short: Raelene Bott Treating Laray Rivkin/Extender: Skipper Cliche in Treatment: 11 Electronic Signature(s) Signed: 06/03/2022 4:28:06 PM By: Massie Kluver Signed: 06/06/2022 5:27:45 PM By: Gretta Cool, BSN, RN, CWS, [name] RN, BSN Entered By: Massie Kluver on 06/03/2022 11:02:33 [name] ([id number]) -------------------------------------------------------------------------------- Multi Wound Chart Details Patient Name: Heather Dear A. Date of Service: 06/03/2022 10:30 AM Medical Record Number: [id number] Patient Account Number: [id number] Date of Birth/Sex: 1976/10/12 (46 y.o. 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 Comorbid History: Chronic sinus problems/congestion, Chronic sinus problems/congestion, N/A Middle ear problems, Anemia, Middle ear problems, Anemia, Chronic Obstructive Pulmonary Chronic Obstructive Pulmonary Disease (COPD), Congestive Heart Disease (COPD), Congestive Heart Failure, Type II Diabetes, End Stage Failure, Type II  Diabetes, End Stage Renal Disease, History of pressure Renal Disease, History of pressure wounds, Neuropathy wounds, Neuropathy Date Acquired: 12/01/2021 03/16/2020 N/A Weeks of Treatment: 11 11 N/A Wound Status: Open Open N/A Wound Recurrence: No No N/A Measurements L x W x D (cm) 1x0.5x0.8 3.5x1x0.2 N/A Area (cm) : 0.393 2.749 N/A Volume (cm) : 0.314 0.55 N/A % Reduction in Area: 78.70% 41.70% N/A % Reduction in Volume: 81.10% 41.60% N/A Classification: Full Thickness Without Exposed Grade 3 N/A Support Structures Exudate Amount: Medium Medium N/A Exudate Type: Serosanguineous Serous N/A Exudate Color: red, brown amber N/A Wound Margin: Distinct, outline attached Flat and Intact N/A Granulation Amount: Medium (34-66%) Large (67-100%) N/A Granulation Quality: Pink, Pale Red, Pink N/A Necrotic Amount: Medium (34-66%) Small (1-33%) N/A Exposed Structures: Fat Layer (Subcutaneous Tissue): Fat Layer (Subcutaneous Tissue): N/A Yes Yes Fascia: No Fascia: No Tendon: No Tendon: No Muscle: No Muscle: No Joint: No Joint: No Bone: No Bone: No Epithelialization: Small (1-33%) Small (1-33%) N/A Treatment Notes [name], [name] ([id number]) Electronic Signature(s) Signed: 06/03/2022 4:28:06 PM By: Massie Kluver Entered By: Massie Kluver on 06/03/2022 11:02:48 [name] ([id number]) -------------------------------------------------------------------------------- [place] Details Patient Name: Heather Dear A. Date of Service: 06/03/2022 10:30 AM Medical Record Number: [id number] Patient Account Number: [id number] Date of Birth/Sex: 12/08/1975 (46 y.o. F) Treating RN: Cornell Barman Primary Care Hamish Banks: Raelene Bott Other Clinician: Massie Kluver Referring Tonnette Zwiebel: Raelene Bott Treating Patryce Depriest/Extender: Skipper Cliche in Treatment: 11 Active Inactive Abuse / Safety / Falls / Self Care Management Nursing Diagnoses: History of Falls Potential  for falls Potential for injury related to falls Goals: Patient will not develop complications from immobility Date Initiated: 03/17/2022 Date Inactivated: 04/20/2022 Target Resolution Date: 03/16/2022 Goal Status: Met Patient/caregiver will verbalize understanding of skin care regimen Date Initiated: 03/17/2022 Target Resolution Date: 03/16/2022 Goal Status: Active Patient/caregiver will verbalize/demonstrate measure taken to improve self care Date Initiated: 03/17/2022 Target Resolution Date: 03/16/2022 Goal Status: Active Interventions: Assess fall risk on admission and as needed Provide education on basic hygiene Provide education on personal and home safety Notes: Necrotic Tissue Nursing Diagnoses: Impaired tissue integrity related to necrotic/devitalized tissue Knowledge deficit related to management of necrotic/devitalized tissue Goals: Necrotic/devitalized tissue will be minimized in the wound bed Date Initiated: 03/17/2022 Date  Inactivated: 05/04/2022 Target Resolution Date: 03/16/2022 Goal Status: Met Patient/caregiver will verbalize understanding of reason and process for debridement of necrotic tissue Date Initiated: 03/17/2022 Target Resolution Date: 03/16/2022 Goal Status: Active Interventions: Provide education on necrotic tissue and debridement process Treatment Activities: Apply topical anesthetic as ordered : 03/16/2022 Excisional debridement : 03/16/2022 Notes: Osteomyelitis Nursing Diagnoses: Infection: osteomyelitis Knowledge deficit related to disease process and management [name], [name] ([id number]) Goals: Patient/caregiver will verbalize understanding of disease process and disease management Date Initiated: 03/17/2022 Date Inactivated: 04/20/2022 Target Resolution Date: 03/16/2022 Goal Status: Met Patient's osteomyelitis will resolve Date Initiated: 03/17/2022 Target Resolution Date: 03/16/2022 Goal Status: Active Signs and symptoms for osteomyelitis  will be recognized and promptly addressed Date Initiated: 03/17/2022 Target Resolution Date: 03/16/2022 Goal Status: Active Interventions: Assess for signs and symptoms of osteomyelitis resolution every visit Provide education on osteomyelitis Treatment Activities: Systemic antibiotics : 03/16/2022 Notes: Wound/Skin Impairment Nursing Diagnoses: Impaired tissue integrity Knowledge deficit related to smoking impact on wound healing Knowledge deficit related to ulceration/compromised skin integrity Goals: Patient/caregiver will verbalize understanding of skin care regimen Date Initiated: 03/17/2022 Date Inactivated: 04/20/2022 Target Resolution Date: 03/16/2022 Goal Status: Met Ulcer/skin breakdown will have a volume reduction of 30% by week 4 Date Initiated: 03/17/2022 Target Resolution Date: 04/13/2022 Goal Status: Active Ulcer/skin breakdown will have a volume reduction of 50% by week 8 Date Initiated: 03/17/2022 Target Resolution Date: 05/04/2022 Goal Status: Active Ulcer/skin breakdown will have a volume reduction of 80% by week 12 Date Initiated: 03/17/2022 Target Resolution Date: 06/01/2022 Goal Status: Active Ulcer/skin breakdown will heal within 14 weeks Date Initiated: 03/17/2022 Target Resolution Date: 06/15/2022 Goal Status: Active Interventions: Assess ulceration(s) every visit Provide education on ulcer and skin care Treatment Activities: Skin care regimen initiated : 03/16/2022 Notes: Electronic Signature(s) Signed: 06/03/2022 4:28:06 PM By: Massie Kluver Signed: 06/06/2022 5:27:45 PM By: Gretta Cool, BSN, RN, CWS, [name] RN, BSN Entered By: Massie Kluver on 06/03/2022 11:02:39 [name], [name] ([id number]) -------------------------------------------------------------------------------- Pain Assessment Details Patient Name: Heather Dear A. Date of Service: 06/03/2022 10:30 AM Medical Record Number: [id number] Patient Account Number: [id number] Date of Birth/Sex: 07/02/1976 (46  y.o. F) Treating RN: Cornell Barman Primary Care Tamario Heal: Raelene Bott Other Clinician: Massie Kluver Referring Shamarcus Hoheisel: Raelene Bott Treating Samon Dishner/Extender: Skipper Cliche in Treatment: 11 Active Problems Location of Pain Severity and Description of Pain Patient Has Paino Yes Site Locations Pain Location: Pain in Ulcers Duration of the Pain. Constant / Intermittento Constant Rate the pain. Current Pain Level: 6 Character of Pain Describe the Pain: Burning, Throbbing Pain Management and Medication Current Pain Management: Medication: Yes Rest: Yes Electronic Signature(s) Signed: 06/03/2022 4:28:06 PM By: Massie Kluver Signed: 06/06/2022 5:27:45 PM By: Gretta Cool, BSN, RN, CWS, [name] RN, BSN Entered By: Massie Kluver on 06/03/2022 10:43:05 [name] ([id number]) -------------------------------------------------------------------------------- Patient/Caregiver Education Details Patient Name: Heather Dear A. Date of Service: 06/03/2022 10:30 AM Medical Record Number: [id number] Patient Account Number: [id number] Date of Birth/Gender: 09-26-1976 (46 y.o. F) Treating RN: Cornell Barman Primary Care Physician: Raelene Bott Other Clinician: Massie Kluver Referring Physician: Raelene Bott Treating Physician/Extender: Skipper Cliche in Treatment: 11 Education Assessment Education Provided To: Patient Education Topics Provided Offloading: Handouts: Other: contact cast care Methods: Explain/Verbal Responses: State content correctly Electronic Signature(s) Signed: 06/03/2022 4:28:06 PM By: Massie Kluver Entered By: Massie Kluver on 06/03/2022 11:25:42 [name], [name] ([id number]) -------------------------------------------------------------------------------- Wound Assessment Details Patient Name: Heather Dear A. Date of Service: 06/03/2022 10:30 AM Medical Record Number: [id number] Patient Account Number: [id number] Date of Birth/Sex:  1976-03-17 (46 y.o.  F) Treating RN: Cornell Barman Primary Care Cristella Stiver: Raelene Bott Other Clinician: Massie Kluver Referring Jashua Knaak: Raelene Bott Treating Treniya Lobb/Extender: Skipper Cliche in Treatment: 11 Wound Status Wound Number: 11 Primary Open Surgical Wound Etiology: Wound Location: Right, Plantar Foot Wound Open Wounding Event: Surgical Injury Status: Date Acquired: 12/01/2021 Comorbid Chronic sinus problems/congestion, Middle ear problems, Weeks Of Treatment: 11 History: Anemia, Chronic Obstructive Pulmonary Disease (COPD), Clustered Wound: No Congestive Heart Failure, Type II Diabetes, End Stage Renal Disease, History of pressure wounds, Neuropathy Photos Wound Measurements Length: (cm) 1 Width: (cm) 0.5 Depth: (cm) 0.8 Area: (cm) 0.393 Volume: (cm) 0.314 % Reduction in Area: 78.7% % Reduction in Volume: 81.1% Epithelialization: Small (1-33%) Wound Description Classification: Full Thickness Without Exposed Support Structu Wound Margin: Distinct, outline attached Exudate Amount: Medium Exudate Type: Serosanguineous Exudate Color: red, brown res Foul Odor After Cleansing: No Slough/Fibrino Yes Wound Bed Granulation Amount: Medium (34-66%) Exposed Structure Granulation Quality: Pink, Pale Fascia Exposed: No Necrotic Amount: Medium (34-66%) Fat Layer (Subcutaneous Tissue) Exposed: Yes Necrotic Quality: Adherent Slough Tendon Exposed: No Muscle Exposed: No Joint Exposed: No Bone Exposed: No Electronic Signature(s) Signed: 06/03/2022 4:28:06 PM By: Massie Kluver Signed: 06/06/2022 5:27:45 PM By: Gretta Cool, BSN, RN, CWS, [name] RN, BSN Entered By: Massie Kluver on 06/03/2022 11:01:28 [name], [name] ([id number]) -------------------------------------------------------------------------------- Wound Assessment Details Patient Name: Heather Dear A. Date of Service: 06/03/2022 10:30 AM Medical Record Number: [id number] Patient Account Number: [id number] Date of Birth/Sex:  1975/12/21 (46 y.o. F) Treating RN: Cornell Barman Primary Care Daiel Strohecker: Raelene Bott Other Clinician: Massie Kluver Referring Thaniel Coluccio: Raelene Bott Treating Medora Roorda/Extender: Skipper Cliche in Treatment: 11 Wound Status Wound Number: 12 Primary Diabetic Wound/Ulcer of the Lower Extremity Etiology: Wound Location: Left, Medial, Plantar Foot Wound Open Wounding Event: Pressure Injury Status: Date Acquired: 03/16/2020 Comorbid Chronic sinus problems/congestion, Middle ear problems, Weeks Of Treatment: 11 History: Anemia, Chronic Obstructive Pulmonary Disease (COPD), Clustered Wound: No Congestive Heart Failure, Type II Diabetes, End Stage Renal Disease, History of pressure wounds, Neuropathy Photos Wound Measurements Length: (cm) 3.5 Width: (cm) 1 Depth: (cm) 0.2 Area: (cm) 2.749 Volume: (cm) 0.55 % Reduction in Area: 41.7% % Reduction in Volume: 41.6% Epithelialization: Small (1-33%) Wound Description Classification: Grade 3 Wound Margin: Flat and Intact Exudate Amount: Medium Exudate Type: Serous Exudate Color: amber Foul Odor After Cleansing: No Slough/Fibrino Yes Wound Bed Granulation Amount: Large (67-100%) Exposed Structure Granulation Quality: Red, Pink Fascia Exposed: No Necrotic Amount: Small (1-33%) Fat Layer (Subcutaneous Tissue) Exposed: Yes Necrotic Quality: Adherent Slough Tendon Exposed: No Muscle Exposed: No Joint Exposed: No Bone Exposed: No Electronic Signature(s) Signed: 06/03/2022 4:28:06 PM By: Massie Kluver Signed: 06/06/2022 5:27:45 PM By: Gretta Cool, BSN, RN, CWS, [name] RN, BSN Entered By: Massie Kluver on 06/03/2022 11:02:11 [name], [name] ([id number]) -------------------------------------------------------------------------------- [place] Details Patient Name: Heather Dear A. Date of Service: 06/03/2022 10:30 AM Medical Record Number: [id number] Patient Account Number: [id number] Date of Birth/Sex: Nov 27, 1975 (46 y.o. F) Treating  RN: Cornell Barman Primary Care Kaymarie Wynn: Raelene Bott Other Clinician: Massie Kluver Referring Renette Hsu: Raelene Bott Treating Rendi Mapel/Extender: Skipper Cliche in Treatment: 11 Vital Signs Time Taken: 10:38 Temperature (F): 98.2 Pulse (bpm): 55 Respiratory Rate (breaths/min): 18 Blood Pressure (mmHg): 93/65 Reference Range: 80 - 120 mg / dl Notes Patient states blood pressure runs low at times Electronic Signature(s) Signed: 06/03/2022 4:28:06 PM By: Massie Kluver Entered By: Massie Kluver on 06/03/2022 10:42:51

## 2022-06-10 NOTE — Progress Notes (Signed)
[name], [name] ([id number]) Visit Report for 06/08/2022 Arrival Information Details Patient Name: [name], [name] Date of Service: 06/08/2022 11:15 AM Medical Record Number: [id number] Patient Account Number: [id number] Date of Birth/Sex: Nov 18, 1975 (46 y.o. F) Treating RN: Cornell Barman Primary Care Florrie Ramires: Raelene Bott Other Clinician: Massie Kluver Referring Lyvonne Cassell: Raelene Bott Treating Shavelle Runkel/Extender: Yaakov Guthrie in Treatment: 12 Visit Information History Since Last Visit All ordered tests and consults were completed: No Patient Arrived: Wheel Chair Added or deleted any medications: No Arrival Time: 12:22 Any new allergies or adverse reactions: No Transfer Assistance: EasyPivot Patient Lift Had [name] fall or experienced change in No activities of daily living that may affect risk of falls: Hospitalized since last visit: No Pain Present Now: Yes Electronic Signature(s) Signed: 06/08/2022 1:47:09 PM By: Massie Kluver Entered By: Massie Kluver on 06/08/2022 12:25:58 [name] ([id number]) -------------------------------------------------------------------------------- Clinic Level of Care Assessment Details Patient Name: Heather Dear [name]. Date of Service: 06/08/2022 11:15 AM Medical Record Number: [id number] Patient Account Number: [id number] Date of Birth/Sex: Jan 23, 1976 (46 y.o. F) Treating RN: Cornell Barman Primary Care Jakyla Reza: Raelene Bott Other Clinician: Massie Kluver Referring Margee Trentham: Raelene Bott Treating Batu Cassin/Extender: Yaakov Guthrie in Treatment: 12 Clinic Level of Care Assessment Items TOOL 1 Quantity Score []  - Use when EandM and Procedure is performed on INITIAL visit 0 ASSESSMENTS - Nursing Assessment / Reassessment []  - General Physical Exam (combine w/ comprehensive assessment (listed just below) when performed on new 0 pt. evals) []  - 0 Comprehensive Assessment (HX, ROS, Risk Assessments, Wounds Hx,  etc.) ASSESSMENTS - Wound and Skin Assessment / Reassessment []  - Dermatologic / Skin Assessment (not related to wound area) 0 ASSESSMENTS - Ostomy and/or Continence Assessment and Care []  - Incontinence Assessment and Management 0 []  - 0 Ostomy Care Assessment and Management (repouching, etc.) PROCESS - Coordination of Care []  - Simple Patient / Family Education for ongoing care 0 []  - 0 Complex (extensive) Patient / Family Education for ongoing care []  - 0 Staff obtains Programmer, systems, Records, Test Results / Process Orders []  - 0 Staff telephones HHA, Nursing Homes / Clarify orders / etc []  - 0 Routine Transfer to another Facility (non-emergent condition) []  - 0 Routine Hospital Admission (non-emergent condition) []  - 0 New Admissions / Biomedical engineer / Ordering NPWT, Apligraf, etc. []  - 0 Emergency Hospital Admission (emergent condition) PROCESS - Special Needs []  - Pediatric / Minor Patient Management 0 []  - 0 Isolation Patient Management []  - 0 Hearing / Language / Visual special needs []  - 0 Assessment of Community assistance (transportation, D/C planning, etc.) []  - 0 Additional assistance / Altered mentation []  - 0 Support Surface(s) Assessment (bed, cushion, seat, etc.) INTERVENTIONS - Miscellaneous []  - External ear exam 0 []  - 0 Patient Transfer (multiple staff / Civil Service fast streamer / Similar devices) []  - 0 Simple Staple / Suture removal (25 or less) []  - 0 Complex Staple / Suture removal (26 or more) []  - 0 Hypo/Hyperglycemic Management (do not check if billed separately) []  - 0 Ankle / Brachial Index (ABI) - do not check if billed separately Has the patient been seen at the hospital within the last three years: Yes Total Score: 0 Level Of Care: ____ [name] ([id number]) Electronic Signature(s) Signed: 06/08/2022 1:47:09 PM By: Massie Kluver Entered By: Massie Kluver on 06/08/2022 12:54:58 [name], [name]  ([id number]) -------------------------------------------------------------------------------- Encounter Discharge Information Details Patient Name: Heather Dear [name]. Date of Service: 06/08/2022 11:15 AM Medical Record Number: [id number] Patient Account Number:  [id number] Date of Birth/Sex: 07-Apr-1976 (46 y.o. F) Treating RN: Cornell Barman Primary Care Jacinta Penalver: Raelene Bott Other Clinician: Massie Kluver Referring Blease Capaldi: Raelene Bott Treating Manila Rommel/Extender: Yaakov Guthrie in Treatment: 12 Encounter Discharge Information Items Post Procedure Vitals Discharge Condition: Stable Temperature (F): 98.4 Ambulatory Status: Wheelchair Pulse (bpm): 57 Discharge Destination: Home Respiratory Rate (breaths/min): 16 Transportation: Private Auto Blood Pressure (mmHg): 104/62 Accompanied By: mom Schedule Follow-up Appointment: Yes Clinical Summary of Care: Electronic Signature(s) Signed: 06/08/2022 1:47:09 PM By: Massie Kluver Entered By: Massie Kluver on 06/08/2022 13:37:17 [name], [name] ([id number]) -------------------------------------------------------------------------------- Lower Extremity Assessment Details Patient Name: Heather Dear [name]. Date of Service: 06/08/2022 11:15 AM Medical Record Number: [id number] Patient Account Number: [id number] Date of Birth/Sex: [birth year] (46 y.o. F) Treating RN: Cornell Barman Primary Care Kritika Stukes: Raelene Bott Other Clinician: Massie Kluver Referring Eldonna Neuenfeldt: Raelene Bott Treating Levert Heslop/Extender: Yaakov Guthrie in Treatment: 12 Electronic Signature(s) Signed: 06/08/2022 1:47:09 PM By: Massie Kluver Signed: 06/09/2022 7:54:04 AM By: Gretta Cool, BSN, RN, CWS, [name] RN, BSN Entered By: Massie Kluver on 06/08/2022 12:43:26 [name], [name] [name] ([id number]) -------------------------------------------------------------------------------- Multi Wound Chart Details Patient Name: Heather Dear [name]. Date of Service: 06/08/2022 11:15  AM Medical Record Number: [id number] Patient Account Number: [id number] Date of Birth/Sex: 08/20/1976 (46 y.o. 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 Comorbid History: Chronic sinus problems/congestion, Chronic sinus problems/congestion, N/[name] Middle ear problems, Anemia, Middle ear problems, Anemia, Chronic Obstructive Pulmonary Chronic Obstructive Pulmonary Disease (COPD), Congestive Heart Disease (COPD), Congestive Heart Failure, Type II Diabetes, End Stage Failure, Type II Diabetes, End Stage Renal Disease, History of pressure Renal Disease, History of pressure wounds, Neuropathy wounds, Neuropathy Date Acquired: 12/01/2021 03/16/2020 N/[name] Weeks of Treatment: 12 12 N/[name] Wound Status: Open Open N/[name] Wound Recurrence: No No N/[name] Measurements L x W x D (cm) 0.7x0.9x0.5 4x0.8x0.2 N/[name] Area (cm) : 0.495 2.513 N/[name] Volume (cm) : 0.247 0.503 N/[name] % Reduction in Area: 73.20% 46.70% N/[name] % Reduction in Volume: 85.10% 46.60% N/[name] Classification: Full Thickness Without Exposed Grade 3 N/[name] Support Structures Exudate Amount: Medium Medium N/[name] Exudate Type: Serosanguineous Serous N/[name] Exudate Color: red, brown amber N/[name] Wound Margin: Distinct, outline attached Flat and Intact N/[name] Granulation Amount: Medium (34-66%) Large (67-100%) N/[name] Granulation Quality: Pink, Pale Red, Pink N/[name] Necrotic Amount: Medium (34-66%) Small (1-33%) N/[name] Exposed Structures: Fat Layer  (Subcutaneous Tissue): Fat Layer (Subcutaneous Tissue): N/[name] Yes Yes Fascia: No Fascia: No Tendon: No Tendon: No Muscle: No Muscle: No Joint: No Joint: No Bone: No Bone: No Epithelialization: Small (1-33%) Small (1-33%) N/[name] Treatment Notes HADASSA, CERMAK [name]. ([id number]) Electronic Signature(s) Signed: 06/08/2022 1:47:09 PM By: Massie Kluver Entered By: Massie Kluver on 06/08/2022 12:46:10 [name] ([id number]) -------------------------------------------------------------------------------- [place] Details Patient Name: Heather Dear [name]. Date of Service: 06/08/2022 11:15 AM Medical Record Number: [id number] Patient Account Number: [id number] Date of Birth/Sex: Oct 12, 1976 (46 y.o. F) Treating RN: Cornell Barman Primary Care Lexxi Koslow: Raelene Bott Other Clinician: Massie Kluver Referring [place]: Raelene Bott Treating Tehya Leath/Extender: Yaakov Guthrie in Treatment: 12 Active Inactive Abuse / Safety / Falls / Self Care Management Nursing Diagnoses: History of Falls Potential for  falls Potential for injury related to falls Goals: Patient will not develop complications from immobility Date Initiated: 03/17/2022 Date Inactivated: 04/20/2022 Target Resolution Date: 03/16/2022 Goal Status: Met Patient/caregiver will verbalize understanding of skin care regimen Date Initiated: 03/17/2022 Target Resolution Date: 03/16/2022 Goal Status: Active Patient/caregiver will verbalize/demonstrate measure taken to improve self care Date Initiated: 03/17/2022 Target Resolution Date: 03/16/2022 Goal Status: Active Interventions: Assess fall risk on admission and as needed Provide education on basic hygiene Provide education on personal and home safety Notes: Necrotic Tissue Nursing Diagnoses: Impaired tissue integrity related to necrotic/devitalized tissue Knowledge deficit related to management of necrotic/devitalized  tissue Goals: Necrotic/devitalized tissue will be minimized in the wound bed Date Initiated: 03/17/2022 Date Inactivated: 05/04/2022 Target Resolution Date: 03/16/2022 Goal Status: Met Patient/caregiver will verbalize understanding of reason and process for debridement of necrotic tissue Date Initiated: 03/17/2022 Target Resolution Date: 03/16/2022 Goal Status: Active Interventions: Provide education on necrotic tissue and debridement process Treatment Activities: Apply topical anesthetic as ordered : 03/16/2022 Excisional debridement : 03/16/2022 Notes: Osteomyelitis Nursing Diagnoses: Infection: osteomyelitis Knowledge deficit related to disease process and management [name], [name] ([id number]) Goals: Patient/caregiver will verbalize understanding of disease process and disease management Date Initiated: 03/17/2022 Date Inactivated: 04/20/2022 Target Resolution Date: 03/16/2022 Goal Status: Met Patient's osteomyelitis will resolve Date Initiated: 03/17/2022 Target Resolution Date: 03/16/2022 Goal Status: Active Signs and symptoms for osteomyelitis will be recognized and promptly addressed Date Initiated: 03/17/2022 Target Resolution Date: 03/16/2022 Goal Status: Active Interventions: Assess for signs and symptoms of osteomyelitis resolution every visit Provide education on osteomyelitis Treatment Activities: Systemic antibiotics : 03/16/2022 Notes: Wound/Skin Impairment Nursing Diagnoses: Impaired tissue integrity Knowledge deficit related to smoking impact on wound healing Knowledge deficit related to ulceration/compromised skin integrity Goals: Patient/caregiver will verbalize understanding of skin care regimen Date Initiated: 03/17/2022 Date Inactivated: 04/20/2022 Target Resolution Date: 03/16/2022 Goal Status: Met Ulcer/skin breakdown will have [name] volume reduction of 30% by week 4 Date Initiated: 03/17/2022 Target Resolution Date: 04/13/2022 Goal Status:  Active Ulcer/skin breakdown will have [name] volume reduction of 50% by week 8 Date Initiated: 03/17/2022 Target Resolution Date: 05/04/2022 Goal Status: Active Ulcer/skin breakdown will have [name] volume reduction of 80% by week 12 Date Initiated: 03/17/2022 Target Resolution Date: 06/01/2022 Goal Status: Active Ulcer/skin breakdown will heal within 14 weeks Date Initiated: 03/17/2022 Target Resolution Date: 06/15/2022 Goal Status: Active Interventions: Assess ulceration(s) every visit Provide education on ulcer and skin care Treatment Activities: Skin care regimen initiated : 03/16/2022 Notes: Electronic Signature(s) Signed: 06/08/2022 1:47:09 PM By: Massie Kluver Signed: 06/09/2022 7:54:04 AM By: Gretta Cool, BSN, RN, CWS, [name] RN, BSN Entered By: Massie Kluver on 06/08/2022 12:43:31 [name], [name] ([id number]) -------------------------------------------------------------------------------- Pain Assessment Details Patient Name: Heather Dear [name]. Date of Service: 06/08/2022 11:15 AM Medical Record Number: [id number] Patient Account Number: [id number] Date of Birth/Sex: September 21, 1976 (46 y.o. F) Treating RN: Cornell Barman Primary Care Noland Pizano: Raelene Bott Other Clinician: Massie Kluver Referring Stefen Juba: Raelene Bott Treating Makesha Belitz/Extender: Yaakov Guthrie in Treatment: 12 Active Problems Location of Pain Severity and Description of Pain Patient Has Paino No Site Locations Pain Management and Medication Current Pain Management: Electronic Signature(s) Signed: 06/08/2022 1:47:09 PM By: Massie Kluver Signed: 06/09/2022 7:54:04 AM By: Gretta Cool, BSN, RN, CWS, [name] RN, BSN Entered By: Massie Kluver on 06/08/2022 12:29:04 [name] ([id number]) -------------------------------------------------------------------------------- Patient/Caregiver Education Details Patient Name: Heather Dear [name]. Date of Service: 06/08/2022 11:15 AM Medical Record Number: [id number] Patient Account  Number: [id number] Date of Birth/Gender: 03-Dec-1975 (46 y.o. F) Treating RN: Gretta Cool,  [place] Primary Care Physician: Raelene Bott Other Clinician: Massie Kluver Referring Physician: Raelene Bott Treating Physician/Extender: Yaakov Guthrie in Treatment: 12 Education Assessment Education Provided To: Patient Education Topics Provided Wound/Skin Impairment: Handouts: Other: continue wound care as directed Methods: Explain/Verbal Responses: State content correctly Electronic Signature(s) Signed: 06/08/2022 1:47:09 PM By: Massie Kluver Entered By: Massie Kluver on 06/08/2022 12:55:30 [name], [name] ([id number]) -------------------------------------------------------------------------------- Wound Assessment Details Patient Name: Heather Dear [name]. Date of Service: 06/08/2022 11:15 AM Medical Record Number: [id number] Patient Account Number: [id number] Date of Birth/Sex: 1976-03-31 (46 y.o. F) Treating RN: Cornell Barman Primary Care Fathima Bartl: Raelene Bott Other Clinician: Massie Kluver Referring Tabbetha Kutscher: Raelene Bott Treating Axl Rodino/Extender: Yaakov Guthrie in Treatment: 12 Wound Status Wound Number: 11 Primary Open Surgical Wound Etiology: Wound Location: Right, Plantar Foot Wound Open Wounding Event: Surgical Injury Status: Date Acquired: 12/01/2021 Comorbid Chronic sinus problems/congestion, Middle ear problems, Weeks Of Treatment: 12 History: Anemia, Chronic Obstructive Pulmonary Disease (COPD), Clustered Wound: No Congestive Heart Failure, Type II Diabetes, End Stage Renal Disease, History of pressure wounds, Neuropathy Photos Wound Measurements Length: (cm) 0.7 Width: (cm) 0.9 Depth: (cm) 0.5 Area: (cm) 0.495 Volume: (cm) 0.247 % Reduction in Area: 73.2% % Reduction in Volume: 85.1% Epithelialization: Small (1-33%) Wound Description Classification: Full Thickness Without Exposed Support Structures Wound Margin: Distinct, outline  attached Exudate Amount: Medium Exudate Type: Serosanguineous Exudate Color: red, brown Foul Odor After Cleansing: No Slough/Fibrino Yes Wound Bed Granulation Amount: Medium (34-66%) Exposed Structure Granulation Quality: Pink, Pale Fascia Exposed: No Necrotic Amount: Medium (34-66%) Fat Layer (Subcutaneous Tissue) Exposed: Yes Necrotic Quality: Adherent Slough Tendon Exposed: No Muscle Exposed: No Joint Exposed: No Bone Exposed: No Treatment Notes Wound #11 (Foot) Wound Laterality: Plantar, Right Cleanser Soap and Water Discharge Instruction: Gently cleanse wound with antibacterial soap, rinse and pat dry prior to dressing wounds [name], [name] ([id number]) Peri-Wound Care Topical keystone gel Primary Dressing Aquacel Extra Hydrofiber Dressing, 4x5 (in/in) Discharge Instruction: packed into wound Secondary Dressing ABD Pad 5x9 (in/in) Discharge Instruction: Cover with ABD pad Secured With Medipore Tape - 40M Medipore H Soft Cloth Surgical Tape, 2x2 (in/yd) Kerlix Roll Sterile or Non-Sterile 6-ply 4.5x4 (yd/yd) Discharge Instruction: Apply Kerlix as directed Compression Wrap Compression Stockings Add-Ons Electronic Signature(s) Signed: 06/08/2022 1:47:09 PM By: Massie Kluver Signed: 06/09/2022 7:54:04 AM By: Gretta Cool, BSN, RN, CWS, [name] RN, BSN Entered By: Massie Kluver on 06/08/2022 12:42:14 [place], Carnell [name]. ([id number]) -------------------------------------------------------------------------------- Wound Assessment Details Patient Name: Heather Dear [name]. Date of Service: 06/08/2022 11:15 AM Medical Record Number: [id number] Patient Account Number: [id number] Date of Birth/Sex: Jul 05, 1976 (46 y.o. F) Treating RN: Cornell Barman Primary Care Dondrea Clendenin: Raelene Bott Other Clinician: Massie Kluver Referring Alayah Knouff: Raelene Bott Treating Copeland [name]/Extender: Yaakov Guthrie in Treatment: 12 Wound Status Wound Number: 12 Primary Diabetic Wound/Ulcer of the Lower  Extremity Etiology: Wound Location: Left, Medial, Plantar Foot Wound Open Wounding Event: Pressure Injury Status: Date Acquired: 03/16/2020 Comorbid Chronic sinus problems/congestion, Middle ear problems, Weeks Of Treatment: 12 History: Anemia, Chronic Obstructive Pulmonary Disease (COPD), Clustered Wound: No Congestive Heart Failure, Type II Diabetes, End Stage Renal Disease, History of pressure wounds, Neuropathy Photos Wound Measurements Length: (cm) 4 Width: (cm) 0.8 Depth: (cm) 0.2 Area: (cm) 2.513 Volume: (cm) 0.503 % Reduction in Area: 46.7% % Reduction in Volume: 46.6% Epithelialization: Small (1-33%) Wound Description Classification: Grade 3 Wound Margin: Flat and Intact Exudate Amount: Medium Exudate Type: Serous Exudate Color: amber Foul Odor After Cleansing: No Slough/Fibrino Yes Wound Bed Granulation Amount: Large (67-100%) Exposed Structure  Granulation Quality: Red, Pink Fascia Exposed: No Necrotic Amount: Small (1-33%) Fat Layer (Subcutaneous Tissue) Exposed: Yes Necrotic Quality: Adherent Slough Tendon Exposed: No Muscle Exposed: No Joint Exposed: No Bone Exposed: No Treatment Notes Wound #12 (Foot) Wound Laterality: Plantar, Left, Medial Cleanser Soap and Water Discharge Instruction: Gently cleanse wound with antibacterial soap, rinse and pat dry prior to dressing wounds Gruber, Zanyiah [name]. ([id number]) Peri-Wound Care Topical keystone gel Discharge Instruction: apply very thin layer to wound Primary Dressing Aquacel Extra Hydrofiber Dressing, 4x5 (in/in) Discharge Instruction: placed over keystone gel Secondary Dressing ABD Pad 5x9 (in/in) Discharge Instruction: Cover with ABD pad Secured With Medipore Tape - 86M Medipore H Soft Cloth Surgical Tape, 2x2 (in/yd) Kerlix Roll Sterile or Non-Sterile 6-ply 4.5x4 (yd/yd) Discharge Instruction: Apply Kerlix as directed Compression Wrap Compression Stockings Add-Ons Electronic  Signature(s) Signed: 06/08/2022 1:47:09 PM By: Massie Kluver Signed: 06/09/2022 7:54:04 AM By: Gretta Cool, BSN, RN, CWS, [name] RN, BSN Entered By: Massie Kluver on 06/08/2022 12:43:08 [name] ([id number]) -------------------------------------------------------------------------------- [place] Details Patient Name: Heather Dear [name]. Date of Service: 06/08/2022 11:15 AM Medical Record Number: [id number] Patient Account Number: [id number] Date of Birth/Sex: September 24, 1976 (46 y.o. F) Treating RN: Cornell Barman Primary Care Michelina Mexicano: Raelene Bott Other Clinician: Massie Kluver Referring Darla Mcdonald: Raelene Bott Treating Jabin Tapp/Extender: Yaakov Guthrie in Treatment: 12 Vital Signs Time Taken: 12:27 Temperature (F): 98.4 Pulse (bpm): 57 Respiratory Rate (breaths/min): 16 Blood Pressure (mmHg): 104/62 Reference Range: 80 - 120 mg / dl Electronic Signature(s) Signed: 06/08/2022 1:47:09 PM By: Massie Kluver Entered By: Massie Kluver on 06/08/2022 12:28:59

## 2022-06-10 NOTE — Progress Notes (Signed)
LAYKEN, BEG ([id number]) Visit Report for 06/08/2022 Chief Complaint Document Details Patient Name: [name], [name] Date of Service: 06/08/2022 11:15 AM Medical Record Number: [id number] Patient Account Number: [id number] Date of Birth/Sex: 1975/12/08 (46 y.o. F) Treating RN: Cornell Barman Primary Care Provider: Raelene Bott Other Clinician: Massie Kluver Referring Provider: Raelene Bott Treating Provider/Extender: Yaakov Guthrie in Treatment: 12 Information Obtained from: Patient Chief Complaint 03/19/2021; patient referred by Dr. Luana Shu who has been looking after her left foot for quite a period of time for review of a nonhealing area in the left midfoot 03/12/2022; bilateral feet wounds and right lower extremity wound. Electronic Signature(s) Signed: 06/08/2022 1:41:56 PM By: Kalman Shan DO Entered By: Kalman Shan on 06/08/2022 12:56:37 [name] ([id number]) -------------------------------------------------------------------------------- Debridement Details Patient Name: Heather Dear A. Date of Service: 06/08/2022 11:15 AM Medical Record Number: [id number] Patient Account Number: [id number] Date of Birth/Sex: [birth year] (46 y.o. F) Treating RN: Cornell Barman Primary Care Provider: Raelene Bott Other Clinician: Massie Kluver Referring Provider: Raelene Bott Treating Provider/Extender: Yaakov Guthrie in Treatment: 12 Debridement Performed for Wound #12 Left,Medial,Plantar Foot Assessment: Performed By: Physician Kalman Shan, MD Debridement Type: Debridement Severity of Tissue Pre Debridement: Fat layer exposed Level of Consciousness (Pre- Awake and Alert procedure): Pre-procedure Verification/Time Out Yes - 12:45 Taken: Start Time: 12:45 Total Area Debrided (L x W): 4 (cm) x 0.8 (cm) = 3.2 (cm) Tissue and other material Viable, Non-Viable, Slough, Subcutaneous, Slough debrided: Level: Skin/Subcutaneous Tissue Debridement  Description: Excisional Instrument: Curette Bleeding: Minimum Hemostasis Achieved: Pressure End Time: 12:51 Response to Treatment: Procedure was tolerated well Level of Consciousness (Post- Awake and Alert procedure): Post Debridement Measurements of Total Wound Length: (cm) 4 Width: (cm) 0.8 Depth: (cm) 0.2 Volume: (cm) 0.503 Character of Wound/Ulcer Post Debridement: Stable Severity of Tissue Post Debridement: Fat layer exposed Post Procedure Diagnosis Same as Pre-procedure Electronic Signature(s) Signed: 06/08/2022 1:41:56 PM By: Kalman Shan DO Signed: 06/08/2022 1:47:09 PM By: Massie Kluver Signed: 06/09/2022 7:54:04 AM By: Gretta Cool, BSN, RN, CWS, [name] RN, BSN Entered By: Massie Kluver on 06/08/2022 12:51:15 [name], [name] ([id number]) -------------------------------------------------------------------------------- Debridement Details Patient Name: Heather Dear A. Date of Service: 06/08/2022 11:15 AM Medical Record Number: [id number] Patient Account Number: [id number] Date of Birth/Sex: 1975/11/14 (46 y.o. F) Treating RN: Cornell Barman Primary Care Provider: Raelene Bott Other Clinician: Massie Kluver Referring Provider: Raelene Bott Treating Provider/Extender: Yaakov Guthrie in Treatment: 12 Debridement Performed for Wound #11 Right,Plantar Foot Assessment: Performed By: Physician Kalman Shan, MD Debridement Type: Debridement Level of Consciousness (Pre- Awake and Alert procedure): Pre-procedure Verification/Time Out Yes - 12:52 Taken: Start Time: 12:52 Total Area Debrided (L x W): 0.7 (cm) x 0.9 (cm) = 0.63 (cm) Tissue and other material Viable, Non-Viable, Slough, Subcutaneous, Slough debrided: Level: Skin/Subcutaneous Tissue Debridement Description: Excisional Instrument: Curette Bleeding: Minimum Hemostasis Achieved: Pressure Response to Treatment: Procedure was tolerated well Level of Consciousness (Post- Awake and  Alert procedure): Post Debridement Measurements of Total Wound Length: (cm) 0.7 Width: (cm) 0.9 Depth: (cm) 0.6 Volume: (cm) 0.297 Character of Wound/Ulcer Post Debridement: Stable Post Procedure Diagnosis Same as Pre-procedure Electronic Signature(s) Signed: 06/08/2022 1:41:56 PM By: Kalman Shan DO Signed: 06/08/2022 1:47:09 PM By: Massie Kluver Signed: 06/09/2022 7:54:04 AM By: Gretta Cool, BSN, RN, CWS, [name] RN, BSN Entered By: Massie Kluver on 06/08/2022 12:52:45 [name], [name] ([id number]) -------------------------------------------------------------------------------- HPI Details Patient Name: Heather Dear A. Date of Service: 06/08/2022 11:15 AM Medical Record Number: [id number] Patient Account Number: [id number] Date of Birth/Sex: 1976/01/03 (45 y.o.  F) Treating RN: Cornell Barman Primary Care Provider: Raelene Bott Other Clinician: Massie Kluver Referring Provider: Raelene Bott Treating Provider/Extender: Yaakov Guthrie in Treatment: 12 History of Present Illness HPI Description: 01/18/18-She is here for initial evaluation of the left great toe ulcer. She is a poor historian in regards to timeframe in detail. She states approximately 4 weeks ago she lacerated her toe on something in the house. She followed up with her primary care who placed her on Bactrim and ultimately a second dose of Bactrim prior to coming to wound clinic. She states she has been treating the toe with peroxide, Betadine and a Band-Aid. She did not check her blood sugar this morning but checked it yesterday morning it was 327; she is unaware of a recent A1c and there are no current records. She saw Dr. she would've orthopedics last week for an old injury to the left ankle, she states he did not see her toe, nor did she bring it to his attention. She smokes approximately 1 pack cigarettes a day. Her social situation is concerning, she arrives this morning with her mother who appears extremely  intoxicated/under the influence; her mother was asked to leave the room and be monitored by the patient's grandmother. The patient's aunt then accompanied the patient and the room throughout the rest of the appointment. We had a lengthy discussion regarding the deleterious effects of uncontrolled hyperglycemia and smoking as it relates to wound healing and overall health. She was strongly encouraged to decrease her smoking and get her diabetes under better control. She states she is currently on a diet and has cut down her [facility] consumption. The left toe is erythematous, macerated and slightly edematous with malodor present. The edema in her left foot is below her baseline, there is no erythema streaking. We will treat her with Santyl, doxycycline; we have ordered and xray, culture and provided a Peg assist surgical shoe and cultured the wound. 01/25/18-She is here in follow-up evaluation for a left great toe ulcer and presents with an abscess to her suprapubic area. She states her blood sugars remain elevated, feeling "sick" and if levels are below 250, but she is trying. She has made no attempt to decrease her smoking stating that we "can't take away her food in her cigarettes". She has been compliant with offloading using the PEG assist you. She is using Santyl daily. the culture obtained last week grew staph aureus and Enterococcus faecalis; continues on the doxycycline and Augmentin was added on Monday. The suprapubic area has erythema, no femoral variation, purple discoloration, minimal induration, was accessed with a cotton tip applicator with sanguinopurulent drainage, this was cultured, I suspect the current antibiotic treatment will cover and we will not add anything to her current treatment plan. She was advised to go to urgent care or ER with any change in redness, induration or fever. 02/01/18-She is here in follow-up evaluation for left great toe ulcers and a new abdominal abscess  from last week. She was able to use packing until earlier this week, where she "forgot it was there". She states she was feeling ill with GI symptoms last week and was not taking her antibiotic. She states her glucose levels have been predominantly less than 200, with occasional levels between 200-250. She thinks this was contributing to her GI symptoms as they have resolved without intervention. There continues to be significant laceration to left toe, otherwise it clinically looks stable/improved. There is now less superficial opening to the lateral  aspect of the great toe that was residual blister. We will transition to [facility] to all wounds, she will continue her Augmentin. If there is no change or deterioration next week for reculture. 02/08/18-She is here in follow-up evaluation for left great toe ulcer and abdominal ulcer. There is an improvement in both wounds. She has been wrapping her left toe with coban, not by our direction, which has created an area of discoloration to the medial aspect; she has been advised to NOT use coban secondary to her neuropathy. She states her glucose levels have been high over this last week ranging from 200-350, she continues to smoke. She admits to being less compliant with her offloading shoe. We will continue with same treatment plan and she will follow-up next week. 02/15/18-She is here in follow-up evaluation for left great toe ulcer and abdominal ulcer. The abdominal ulcer is epithelialized. The left great toe ulcer is improved and all injury from last week using the Coban wrap is resolved, the lateral ulcer is healed. She admits to noncompliance with wearing offloading shoe and admits to glucose levels being greater than 300 most of the week. She continues to smoke and expresses no desire to quit. There is one area medially that probes deeper than it has historically, erythema to the toe and dorsal foot has consistently waxed and waned. There is no  overt signs of cellulitis or infection but we will culture the wound for any occult infection given the new area of depth and erythema. We will hold off on sensitivities for initiation of antibiotic therapy. 02/22/18-She is here in follow up evaluation for left great toe ulcer. There is overall significant improvement in both wound appearance, erythema and edema with changes made last week. She was not initiated on antibiotic therapy. Culture obtained last week showed oxacillin sensitive staph aureus, sensitive to clindamycin. Clindamycin has been called into the pharmacy but she has been instructed to hold off on initiation secondary to overall clinical improvement and her history of antibiotic intolerance. She has been instructed to contact the clinic with any noted changes/deterioration and the wound, erythema, edema and/or pain. She will follow-up next week. She continues to smoke and her glucose levels remain elevated >250; she admits to compliance with offloading shoe 03/01/18 on evaluation today patient appears to be doing fairly well in regard to her left first toe ulcer. She has been tolerating the dressing changes with the [facility] Dressing without complication and overall this has definitely showed signs of improvement according to records as well is what the patient tells me today. I'm very pleased in that regard. She is having no pain today 03/08/18 She is here for follow up evaluation of a left great toe ulcer. She remains non-compliant with glucose control and smoking cessation; glucose levels consistently >200. She states that she got new shoe inserts/peg assist. She admits to compliance with offloading. Since my last evaluation there is significant improvement. We will switch to prisma at this time and she will follow up next week. She is noted to be tachycardic at this appointment, heart rate 120s; she has a history of heart rate 70-130 according to our records. She admits to extreme  agitation r/t personal issues; she was advised to monitor her heartrate and contact her physician if it does not return to a more normal range (<100). She takes cardizem twice daily. 03/15/18-She is here in follow-up evaluation for left great toe ulcer. She remains noncompliant with glucose control and smoking cessation. She  admits to compliance with wearing offloading shoe. The ulcer is improved/stable and we will continue with the same treatment plan and she will follow-up next week 03/22/18-She is here for evaluation for left great toe ulcer. There continues to be significant improvement despite recurrent hyperglycemia (over 500 yesterday) and she continues to smoke. She has been compliant with offloading and we will continue with same treatment plan and she will follow-up next week. 03/29/18-She is here for evaluation for left great toe ulcer. Despite continuing to smoke and uncontrolled diabetes she continues to improve. She is compliant with offloading shoe. We will continue with the same treatment plan and she will follow-up next week 04/05/18- She is here in follow up evaluation for a left great toe ulcer; she presents with small pustule to left fifth toe (resembles ant bite). She admits to compliance with wearing offloading shoe; continues to smoke or have uncontrolled blood glucose control. There is more callus than usual with evidence of bleeding; she denies known trauma. 04/12/18-She is here for evaluation of left great toe ulcer. Despite noncompliance with glycemic control and smoking she continues to make [name], [name] ([id number]) improvement. She continues to wear offloading shoe. The pustule, that was identified last week, to the left fifth toe is resolved. She will follow-up in 2 weeks 05/03/18-she is seen in follow-up evaluation for a left great toe ulcer. She is compliant with offloading, otherwise noncompliant with glycemic control and smoking. She has plateaued and there is  minimal improvement noted. We will transition to [facility], replaced the insert to her surgical shoe and she will follow-up in one week 05/10/18- She is here in follow up evaluation for a left great toe ulcer. It appears stable despite measurement change. We will continue with same treatment plan and follow up next week. 05/24/18-She is seen in follow-up evaluation for a left great toe ulcer. She remains compliant with offloading, has made significant improvement in her diet, decreasing the amount of sugar/soda. She said her recent A1c was 10.9 which is lower than. She did see a diabetic nutritionist/educator yesterday. She continues to smoke. We will continue with the same treatment plan and she'll follow-up next week. 05/31/18- She is seen in follow-up evaluation for left great toe ulcer. She continues to remain compliant with offloading, continues to make improvement in her diet, increasing her water and decreasing the amount of sugar/soda. She does continue to smoke with no desire to quit. We will apply Prisma to the depth and Hydrofera Blue over. We have not received insurance authorization for oasis. She will follow up next week. 06/07/18-She is seen in follow-up evaluation for left great toe ulcer. It has stalled according to today's measurements although base appears stable. She says she saw a diabetic educator yesterday; her average blood sugars are less than 300 which is an improvement for her. She continues to smoke and states "that's my next step" She continues with water over soda. We will order for xray, culture and reinstate ace wrap compression prior to placing apligraf for next week. She is voicing no complaints or concerns. Her dressing will change to iodoflex over the next week in preparation for apligraf. 06/14/18-She is seen in follow-up evaluation for left great toe ulcer. Plain film x-ray performed last week was negative for osteomyelitis. Wound culture obtained last week grew  strep B and OSSA; she is initiated on keflex and cefdinir today; there is erythema to the toe which could be from ace wrap compression, she has a history of  wrapping too tight and has has been encouraged to maintain ace wraps that we place today. We will hold off on application of apligraf today, will apply next week after antibiotic therapy has been initiated. She admits today that she has resumed taking a shower with her foot/toe submerged in water, she has been reminded to keep foot/toe out of the bath water. She will be seen in follow up next week 06/21/18-she is seen in follow-up evaluation for left great toe ulcer. She is tolerating antibiotic therapy with no GI disturbance. The wound is stable. Apligraf was applied today. She has been decreasing her smoking, only had 4 cigarettes yesterday and 1 today. She continues being more compliant in diabetic diet. She will follow-up next week for evaluation of site, if stable will remove at 2 weeks. 06/28/18- She is here in follow up evalution. Apligraf was placed last week, she states the dressing fell off on Tuesday and she was dressing with hydrofera blue. She is healed and will be discharged from the clinic today. She has been instructed to continue with smoking cessation, continue monitoring glucose levels, offloading for an additional 4 weeks and continue with hydrofera blue for additional two weeks for any possible microscopic opening. Readmission: 08/07/18 on evaluation today patient presents for reevaluation concerning the ulcer of her right great toe. She was previously discharged on 06/28/18 healed. Nonetheless she states that this began to show signs of drainage she subsequently went to her primary care provider. Subsequently an x-ray was performed on 08/01/18 which was negative. The patient was also placed on antibiotics at that time. Fortunately they should have been effective for the infection. Nonetheless she's been experiencing some  improvement but still has a lot of drainage coming from the wound itself. 08/14/18 on evaluation today patient's wound actually does show signs of improvement in regard to the erythema at this point. She has completed the antibiotics. With that being said we did discuss the possibility of placing her in a total contact cast as of today although I think that I may want to give this just a little bit more time to ensure nothing recurrence as far as her infection is concerned. I do not want to put in the cast and risk infection at that time if things are not completely resolved. With that being said she is gonna require some debridement today. 08/21/18 on evaluation today patient actually appears to be doing okay in regard to her toe ulcer. She's been tolerating the dressing changes without complication. With that being said it does appear that she is ready and in fact I think it's appropriate for [place] to go ahead and initiate the total contact cast today. Nonetheless she will require some sharp debridement to prepare the wound for application. Overall I feel like things have been progressing well but we do need to do something to get this to close more readily. 08/24/18 patient seen today for reevaluation after having had the total contact cast applied on Tuesday. She seems to have done very well the wound appears to be doing great and overall I'm pleased with the progress that she's made. There were no abnormal areas of rubbing from the cast on her lower extremity. 08/30/18 on evaluation today patient actually appears to be completely healed in regard to her plantar toe ulcer. She tells me at this point she's been having a lot of issues with the cast. She almost fell a couple of times the state shall the step of her dog a  couple times as well. This is been a very frustrating process for her other nonetheless she has completely healed the wound which is excellent news. Overall there does not appear to be  the evidence of infection at this time which is great news. 09/11/18 evaluation today patient presents for follow-up concerning her great toe ulcer on the left which has unfortunately reopened since I last saw her which was only a couple of weeks ago. Unfortunately she was not able to get in to get the shoe and potentially the AFO that's gonna be necessary due to her left foot drop. She continues with offloading shoe but this is not enough to prevent her from reopening it appears. When we last had her in the total contact cast she did well from a healing standpoint but unfortunately the wound reopened as soon as she came out of the cast within just a couple of weeks. 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 This is also part of the reason why she falls on a regular basis. I just do not believe that is gonna be safe for [place] to reinitiate the total contact cast as last time we had this on she fell 3 times one week which is definitely not normal for her. 09/18/18 upon evaluation today the patient actually appears to be doing about the same in regard to her toe ulcer. She did not contact [facility] as I asked her to even though I had given her the prescription. In fact she actually states that she has no idea where the prescription is. She did apparently call [facility] and they told her that all she needed to do was bring the prescription in order to be able to be seen and work on getting the AFO for her left foot. With all that being said she still does not have an appointment and I'm not sure were things stand that regard. I will give her a new prescription today in order to contact them to get this set up. 09/25/18 on evaluation today patient actually appears to be doing about the same in regard to her toes  ulcer. She does have a small areas which seems to have a lot of callous buildup around the edge of the wound which is going to need sharp debridement today. She still is waiting to be scheduled for evaluation with [facility] for possibility of an AFO. She states there supposed to call her tomorrow to get this set up. Unfortunately it does appear that her foot specifically the toe area is showing signs of erythema. There does not appear to be any systemic infection which is in these good news. [name], [name] ([id number]) 10/02/18 on evaluation today patient actually appears to be doing about the same in regard to her toe ulcer. This really has not done too well although it's not significantly larger it's also not significantly smaller. She has been tolerating the dressing changes without complication. She actually has her appointment with [facility] and [facility] tomorrow to hopefully be measured for obtaining and AFO splint. I think this would be helpful preventing this from reoccurring. We had contemplated starting the cast this week although to be honest I am reluctant to do that as she's been having nausea, vomiting, and seizure activity over the past three  days. She has a history of seizures and have been told is nothing that can be done for these. With that being said I do believe that along with the seizures have the nausea vomiting which upon further questioning doesn't seem to be the normal for her and makes me concerned for the possibility of infection or something else going on. I discussed this with the patient and her mother during the office visit today. I do not feel the wound is effective but maybe something else. The responses this was "this just happens to her at times and we don't know why". They did not seem to be interested in going to the hospital to have this checked out further. 10/09/18 on evaluation today patient presents for follow-up concerning her ongoing toe ulcer. She has  been tolerating the dressing changes without complication. Fortunately there does not appear to be any evidence of infection which is great news however I do think that the patient would benefit from going ahead for with the total contact cast. She's actually in a wheelchair today she tells me that she will use her walker if we initiate the cast. I was very specific about the fact that if we were gonna do the cast I wanted to make sure that she was using the walker in order to prevent any falls. She tells me she does not have stairs that she has to traverse on a regular basis at her home. She has not had any seizures since last week again that something that happens to her often she tells me she did talk to [employer] and they said that it may take up to three weeks to get the brace approved for her. Hopefully that will not take that long but nonetheless in the meantime I do think the cast could be of benefit. 10/12/18 on evaluation today patient appears to be doing rather well in regard to her toe ulcer. It's just been a few days and already this is significantly improved both as far as overall appearance and size. Fortunately there's no sign of infection. She is here for her first obligatory cast change. 10/19/18 Seen today for follow up and management of left great toe ulcer. Wound continues to show improvement. Noted small open area with seroussang drainage with palpation. Denies any increased pain or recent fevers during visit. She will continue calcium alginate with offloading shoe. Denies any questions or concerns during visit. 10/26/18 on evaluation today patient appears to be doing about the same as when I last saw her in regard to her wound bed. Fortunately there does not appear to be any signs of infection. Unfortunately she continues to have a breakdown in regard to the toe region any time that she is not in the cast. It takes almost no time at all for this to happen. Nonetheless she still has  not heard anything from the brace being made by [employer] as to when exactly this will be available to her. Fortunately there is no signs of infection at this time. 10/30/18 on evaluation today patient presents for application of the total contact cast as we just received him this morning. Fortunately we are gonna be able to apply this to her today which is great news. She continues to have no significant pain which is good news. Overall I do feel like things have been improving while she was the cast is when she doesn't have a cast that things get worse. She still has not really heard anything from [employer]  regarding her brace. 11/02/18 upon evaluation today patient's wound already appears to be doing significantly better which is good news. Fortunately there does not appear to be any signs of infection also good news. Overall I do think the total contact cast as before is helping to heal this area unfortunately it's just not gonna likely keep the area closed and healed without her getting her brace at least. Again the foot drop is a significant issue for her. 11/09/18 on evaluation today patient appears to be doing excellent in regard to her toe ulcer which in fact is completely healed. Fortunately we finally got the situation squared away with the paperwork which was needed to proceed with getting her brace approved by Medicaid. I have filled that out unfortunately that information has been sent to the orthopedic office that I worked at 2 1/2 years ago and not tired Current wound care measures. Fortunately she seems to be doing very well at this time. 11/23/18 on evaluation today patient appears to be doing More Poorly Compared to Last Time I Saw Her. At [facility] She Had Completely Healed. Currently she is continuing to have issues with reopening. She states that she just found out that the brace was approved through Medicaid now she just has to go get measured in order to have this fitted for her and  then made. Subsequently she does not have an appointment for this yet that is going to complicate things we obviously cannot put her back in the cast if we do not have everything measured because they're not gonna be able to measure her foot while she is in the cast. 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 Obviously this is unfortunate and does have me somewhat worried as well. 11/30/18 on evaluation today patient's toe ulcer actually appears to be doing fairly well. The good news is she will be getting her brace in the shoes next week on Wednesday. Hopefully we will be able to get this to heal without having to go back in the cast however she may need the cast in order to get the wound completely heal and then go from there. Fortunately there's no signs of infection at this time. 12/07/18 on evaluation today patient fortunately did receive her brace and she states she could tell this definitely makes her walk better. With that being said she's been having issues with her toe where she noticed yesterday there was a lot of tissue that was loosing off this appears to be much larger than what it was previous. She also states that her leg has been read putting much across the top of her foot just about the ankle although this seems to be receiving somewhat. The total area is still red and appears to be someone infected as best I can tell. She is previously taken Bactrim and that may be a good option for her today as well. We are gonna see what I wound culture shows as well and I think that this is definitely appropriate. With that being said outside of the culture I still need to initiate something in the interim and that's what I'm gonna go ahead and select Bactrim is a good option for her. 12/14/18 on evaluation today patient appears to be doing better in regard to her left great toe ulcer as compared to  last week's evaluation. There's still some erythema although this  is significantly improved which is excellent news. Overall I do believe that she is making good progress is still gonna take some time before she is where I would like her to be from the standpoint of being able to place her back into the total contact cast. Hopefully we will be where we need to be by next week. 12/21/18 on evaluation today patient actually appears to be doing poorly in regard to her toe ulcer. She's been tolerating the dressing changes without complication. Fortunately there's no signs of systemic infection although she does have a lot of drainage from the toe ulcer and this does seem to be causing some issues at this point. She does have erythema on the distal portion of her toe that appears to be likely cellulitis. 12/28/18 on evaluation today patient actually appears to be doing a little better in my pinion in regard to her toe ulcer. With that being said she still does have some evidence of infection at this time and for her culture she had both E. coli as well as enterococcus as organisms noted on evaluation. For that reason I think that though the Keflex likely has treated the E. coli rather well this has really done nothing for the enterococcus. We are going to have to initiate treatment for this specifically. [name], [name] ([id number]) 01/04/19 on evaluation today patient's toe actually appears to be doing better from the standpoint of infection. She currently would like to see about putting the cash back on I think that this is appropriate as long as she takes care of it and keeps it from getting wet. She is gonna have some drainage we can definitely pass this up with Drawtex and alginate to try to prevent as much drainage as possible from causing the problems. With that being said I do want to at least try her with the cast between now and Tuesday. If there any issues we can't continue to use it then I will  discontinue the use of the cast at that point. 01/08/19 on evaluation today patient actually appears to be doing very well as far as her foot ulcer specifically the great toe on the left is concerned. She did have an area of rubbing on the medial aspect of her left ankle which again is from the cast. Fortunately there's no signs of infection at this point in this appears to be a very slight skin breakdown. The patient tells me she felt it rubbing but didn't think it was that bad. Fortunately there is no signs of active infection at this time which is good news. No fevers, chills, nausea, or vomiting noted at this time. 01/15/19 on evaluation today patient actually appears to be doing well in regard to her toe ulcer. Again as previous she seems to do well and she has the cast on which indicates to me that during the time she doesn't have a cast on she's putting way too much pressure on this region. Obviously I think that's gonna be an issue as with the current national emergency concerning the Covid-19 Virus it has been recommended that we discontinue the use of total contact casting by the chief medical officer of our company, Dr. Simona Huh. The reasoning is that if a patient becomes sick and cannot come into have the cast removed they could not just leave this on for an additional two weeks. Obviously the hospitals also do not want to receive patient's who are sick into the emergency department to potentially contaminate the  region and spread the Covid-19 Virus among other sick individuals within the hospital system. Therefore at this point we are suspending the use of total contact cast until the current emergency subsides. This was all discussed with the patient today as well. 01/22/19 on evaluation today patient's wound on her left great toe appears to be doing slightly worse than previously noted last week. She tells me that she has been on this quite a bit in fact she tells me she's been awake for 38  straight hours. This is due to the fact that she's having to care for grandparents because nobody else will. She has been taking care of them for five the last seven days since I've seen her they both have dementia his is from a stroke and her grandmother's was progressive. Nonetheless she states even her mom who knows her condition and situation has only help two of those days to take care of them she's been taking care of the rest. Fortunately there does not appear to be any signs of active infection in regard to her toe at this point although obviously it doesn't look as good as it did previous. I think this is directly related to her not taking off the pressure and friction by way of taking things easy. Though I completely understand what's going on. 01/29/19 on evaluation today patient's tools are actually appears to be showing some signs of improvement today compared to last week's evaluation as far as not necessarily the overall size of the wound but the fact that she has some new skin growth in between the two ends of the wound opening. Overall I feel like she has done well she states that she had a family member give her what sounds to be a CAM walker boot which has been helpful as well. 02/05/19 on evaluation today patient's wound bed actually appears to be doing significantly better in regard to her overall appearance of the size of the wound. With that being said she is still having an issue with offloading efficiently enough to get this to close. Apparently there is some signs of infection at this point as well unfortunately. Previously she's done well of Augmentin I really do not see anything that needs to be culture currently but there theme and cellulitis of the foot that I'm seeing I'm gonna go ahead and place her on an antibiotic today to try to help clear this up. 02/12/2019 on evaluation today patient actually appears to be doing poorly in regard to her overall wound status. She tells  me she has been using her offloading shoe but actually comes in today wearing her tennis shoe with the AFO brace. Again as I previously discussed with her this is really not sufficient to allow the area to heal appropriately. Nonetheless she continues to be somewhat noncompliant and I do wonder based on what she has told my nurse in the past as to whether or not a good portion of this noncompliance may be recreational drug and alcohol related. She has had a history of heroin overdose and this was fairly recently in the past couple of months that have been seeing her. Nonetheless overall I feel like her wound looks significantly worse today compared to what it was previous. She still has significant erythema despite the Augmentin I am not sure that this is an appropriate medication for her infection I am also concerned that the infection may have gone down into her bone. 02/19/19 on evaluation today patient actually appears  to be doing about the same in regard to her toe ulcer. Unfortunately she continues to show signs of bone exposure and infection at this point. There does not appear to be any evidence of worsening of the infection but I'm also not really sure that it's getting significantly better. She is on the Augmentin which should be sufficient for the Staphylococcus aureus infection that she has at this point. With that being said she may need IV antibiotics to more appropriately treat this. We did have a discussion today about hyperbaric option therapy. 02/28/19 on evaluation today patient actually appears to be doing much worse in regard to the wound on her left great toe as compared to even my previous evaluation last week. Unfortunately this seems to be training in a pretty poor direction. Her toe was actually now starting to angle laterally and I can actually see the entire joint area of the proximal portion of the digit where is the distal portion of the digit again is no longer even in  contact with the joint line. Unfortunately there's a lot more necrotic tissue around the edge and the toe appears to be showing signs of becoming gangrenous in my pinion. I'm very concerned about were things stand at this point. She did see infectious disease and they are planning to send in a prescription for Sivextro for her and apparently this has been approved. With that being said I don't think she should avoid taking this but at the same time I'm not sure that it's gonna be sufficient to save her toe at this point. 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 03/05/19 on evaluation today patient actually appears to be doing quite well in regard to her toe all things considering. She still has bone exposed but there appears to be much less your thing on overall the appearance of the wound and the toe itself is dramatically improved. She still does have some issues currently obviously with infection she did see vascular as well and there concerned that her blood flow to the toad. For that reason they are setting up for an angiogram next week. 03/14/19 on evaluation today patient appears to be doing very poor in regard to her toe and specifically in regard to the ulceration and the fact that she's starting to notice the toe was leaning even more towards the lateral aspect and the complete joint is visible on the proximal aspect of the joint. Nonetheless she's also noted a significant odor and the tip of the toe is turning more dark and necrotic appearing. Overall I think she is getting worse not better as far as this is concerned. For that reason I am recommending at this point that she likely needs to be seen for likely amputation. READMISSION 03/19/2021 This is a patient that we cared for in this  clinic for a prolonged period of time in 2019 and 2020 with a left foot and left first toe wound. 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 ([id number]) 04/09/20 by Dr. Luana Shu. In December 2021 she had an ulcer on her right great toe as well as the fourth and fifth toes. She underwent a  partial ray amputation of the right fourth and fifth toes. She also had an angiogram at that time and underwent angioplasty of the right anterior tibial artery. In any case she claims that the wound on the right foot is closed I did not look at this today which was probably an oversight although I think that should be done next week. After her surgery she developed a dehiscence but I do not see any follow-up of this. According to Dr. Deborra Medina last review that she was out of the area being cared for by another physician but recently came back to his attention. 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 A culture of this area showed E. coli apparently before she came back to see Dr. Luana Shu she was supposed to be receiving antibiotics but she did not really take them. Nor is she offloading this area at all. Finally her last hemoglobin A1c listed in epic was in March 2022 at 14.1 she says things are a lot better since then although I am not sure. 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 She had this wound at the time. During this admission she had E. coli sepsis a TEE was negative. 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 She is still a heavy  cigarette smoker. She has a history of bipolar disorder seizure disorder ABI in our clinic was 1.05 6/1; left midfoot in the setting of a TMA done previously. Round circular wound with a "knuckle" of protruding tissue. The problem is that the knuckle was not attached to any of the surrounding granulation and this probed proximally widely I removed a large portion of this tissue. This wound goes with considerable undermining laterally. I do not feel any bone there was no purulence but this is a deep wound. 6/8; in spite of the debridement I did last week. She arrives with a wound looking exactly the same. A protruding "knuckle" of tissue nonadherent to most of the surrounding tissue. There is considerable depth around this from 6-12 o'clock at 2.7 cm and undermining of 1 cm. This does not look overtly infected and the x-ray I did last week was negative for any osseous abnormalities. We have been using silver collagen 6/15; deep tissue culture I did last week showed moderate staph aureus and moderate Pseudomonas. This will definitely require prolonged antibiotic therapy. The pathology on the protuberant area was negative for malignancy fungus etc. 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 We have been using silver collagen. I am going to be prescribing Levaquin for [address] scan of the foot is down for 7/5 6/22; CT scan of the foot on 7 5. She says she has hardware in the left leg from her previous fracture. She is on the Levaquin for the deep tissue culture I did that showed methicillin sensitive staph aureus and Pseudomonas. I gave her a 2-week supply and she will have another week. She arrives in clinic today with the same protuberant tissue however this is nonadherent to the tissue surrounding it. I am really at a loss to explain this unless there is underlying deep tissue infection 6/29; patient presents for 1 week follow-up. She  has been using collagen to the wound bed. She reports taking her antibiotics as prescribed.She has no complaints or issues today. She denies signs of infection. 7/6; patient presents for one week followup. She has been using collagen to the wound bed. She states she is taking Levaquin however at times she is not able to keep it down. She denies signs of infection. 7/13; patient presents for 1 week follow-up. She has been using silver alginate to the wound bed. She still has nausea when taking her antibiotics. She denies signs of infection. 7/20; patient presents for 1 week follow-up. She has been using silver alginate with gentamicin cream to the wound bed. She denies any issues and has no complaints today. She denies signs of infection. 7/27; patient presents for 1 week follow-up. She continues to use silver alginate with gentamicin cream to the wound bed. She reports starting her antibiotics. She has no issues or complaints. Overall she reports stability to the wound. 8/3; patient presents for 1 week follow-up. She has been using silver alginate with gentamicin cream to the wound bed. She reports completing all antibiotics. She has no issues or complaints today. She denies signs of infection. 8/17; patient presents for 2-week follow-up. He is to use silver alginate to the wound bed. She has no issues or complaints today. She denies signs of infection. She reports her pain has improved in her foot since last clinic visit 8/24; patient presents for 1 week follow-up. She continues to use silver alginate to the wound bed. She has no issues or complaints. She denies signs of infection. Pain is stable. 9/7; patient presents for follow-up. She missed her last week appointment due to feeling ill. She continues to use silver alginate. She has a new wound to the right lower extremity that is covered in eschar. She states It occurred over the past week and has no idea how it started. She currently denies  signs of infection. 9/14; patient presents for follow-up. To the left foot wound she has been using gentamicin cream and silver alginate. To the right lower extremity wound she has been keeping this covered and has not obtain Santyl. 9/21; patient presents for follow-up. She reports using gentamicin cream and silver alginate to the left foot and Santyl to the right lower extremity wound. She has no issues or complaints today. She denies signs of infection. 9/28; patient presents for follow-up. She reports a new wound to her right heel. She states this occurred a few days ago and is progressively gotten worse. She has been trying to clean the area with a Q-tip and Santyl. She reports stability in the other 2 wounds. She has been using gentamicin cream and silver alginate to the left foot and Santyl to the right lower extremity wound. 10/12; patient presents for follow-up. She reports improvement to the wound beds. She is seeing vein and vascular to discuss the potential of a left BKA. She states they are going to do an arteriogram. She continues to use silver alginate with dressing changes to her wounds. 11/2; patient presents for follow-up. She states she has not been doing dressing changes to the wound beds. She states she is not able to offload the areas. She reports chronic pain to her left foot wound. 11/9; patient presents for follow-up. She came in with only socks on. She states she forgot to put on shoes. It is unclear if she is doing any dressing changes. She currently denies systemic signs of infection. 11/16; patient presents for  follow-up. She came again only with socks on. She states she does not wear shoes ever. It is unclear if she does dressing changes. She currently denies systemic signs of infection. 11/23; patient presents for follow-up. She wore her shoes today. It still unclear exactly what dressing she is using for each wound but she did states she obtained Dakin's solution and  has been using this to the left foot wound. She currently denies signs of infection. 11/30; patient presents for follow-up. She has no issues or complaints today. She currently denies signs of infection. 12/7; patient presents for follow-up. She has no issues or complaints today. She has been using Hydrofera Blue to the right heel wound and Dakin solution to the left foot wound. Her right anterior leg wound is healed. She currently denies signs of infection. [name], [name] ([id number]) 12/14; patient presents for follow-up. She has been using Hydrofera Blue to the right heel and Dakin's to the left foot wounds. She has no issues or complaints today. She denies signs of infection. 12/21; patient presents for follow-up. She reports using Hydrofera Blue to the right heel and Dakin's to the left foot wound. She denies signs of infection. 12/28; patient presents for follow-up. She continues to use Dakin's to the left foot wound and Hydrofera Blue to the right heel wound. She denies signs of infection. 1/4; patient presents for follow-up. She has no issues or complaints today. She denies signs of infection. 1/11; patient presents for follow-up. It is unclear if she has been dressing these wounds over the past week. She currently denies signs of infection. 1/18; patient presents for follow-up. She states she has been using Dakin's wet-to-dry dressings to the left foot. She has been using Hydrofera Blue to the right foot foot wound. She states that the anterior right leg wound has reopened and draining serous fluid. She denies signs of infection. 1/25; patient presents for follow-up. She has no issues or complaints today. 2/1; patient presents for follow-up. She has no issues or complaints today. She denies signs of infection. 2/8; patient presents for follow-up. She has lost her surgical shoes. She did not have a dressing to the right heel wound. She currently denies signs of infection. 2/15; patient  presents for follow-up. She reports more pain to the right heel today. She denies purulent drainage Or fever/chills 2/22; patient presents for follow-up. She reports taking clindamycin over the past week. She states that she continues to have pain to her right heel. She reports purulent drainage. Readmission 03/16/2022 Ms. Sadye Kiernan is a 46 year old female with a past medical history of type 2 diabetes, osteomyelitis to her feet, chronic systolic heart failure and bipolar disorder that presents to the clinic for bilateral feet wounds and right lower extremity wound. She was last seen in our clinic on 12/15/2021. At that time she had purulent drainage coming out of her right plantar foot and I recommended she go to the ED. She states she went to [facility] and has been there for the past 3 months. I cannot see the records. She states she had OR debridement and was on several weeks of IV antibiotics while inpatient. Since discharge she has not been taking care of the wound beds. She had nothing on her feet other than socks today. She currently denies signs of infection. 5/31; patient presents for follow-up. She has been using Dakin's wet-to-dry dressings to the wound beds on her feet bilaterally and antibiotic ointment to the right anterior leg wound. She  had a wound culture done at last clinic visit that showed moderate Pseudomonas aeruginosa sensitive to ciprofloxacin. She currently denies systemic signs of infection. 6/14; patient presents for follow-up. She received Keystone 5 days ago and has been using this on the wound beds. She states that last week she had to go to the hospital because she had increased warmth and erythema to the right foot. She was started on 2 oral antibiotics. She states she has been taking these. She currently denies systemic signs of infection. She has no issues or complaints today. 6/21; patient presents for follow-up. She states she has been using  Keystone antibiotics to the wound beds. She has no issues or complaints today. She denies signs of infection. 6/28; patient presents for follow-up. She has been using Keystone antibiotics to the wound beds. She has no issues or complaints today. 7/12; patient presents for follow-up. Has been using Keystone antibiotics to the wound beds with calcium alginate. She has no issues or complaints today. She never followed up with her orthopedic surgeon who did the OR debridement to the right foot. We discussed the total contact cast for the left foot and patient would like to do this next week. 7/19; patient presents for follow-up. She has been using Keystone antibiotics with calcium alginate to the wound beds. She has no issues or complaints today. Patient is in agreement to do the total contact cast of the left foot today. She knows to return later this week for the obligatory cast change. 05-13-2022 upon evaluation today patient's wound which she has the cast of the left leg actually appears to be doing significantly better. Fortunately I do not see any signs of active infection locally or systemically which is great news and overall I am extremely pleased with where we stand currently. 7/26; patient presents for follow-up. She has a cast in place for the past week. She states it irritated her shin. Other than that she tolerated the cast well. She states she would like a break for 1 week from the cast. We have been using Keystone antibiotic and Aquacel to both wound beds. She denies signs of infection. 8/2; patient presents for follow-up. She has been using Keystone and Aquacel to the wound beds. She denies any issues and has no complaints. She is agreeable to have the cast placed today for the left leg. 06-03-2022 upon evaluation today patient appears to be doing well with regard to her wound she saw some signs of improvement which is great news. Fortunately I do not see any evidence of active infection  locally or systemically at this time which is great news. No fevers, chills, nausea, vomiting, or diarrhea. 8/16; patient presents for follow-up. She has no issues or complaints today. We have been using Keystone and Aquacel to the wound beds. The left lower extremity is in a total contact cast. She is tolerated this well. Electronic Signature(s) KIORA, HALLBERG ([id number]) Signed: 06/08/2022 1:41:56 PM By: Kalman Shan DO Entered By: Kalman Shan on 06/08/2022 12:57:09 [name] ([id number]) -------------------------------------------------------------------------------- Physical Exam Details Patient Name: Heather Dear A. Date of Service: 06/08/2022 11:15 AM Medical Record Number: [id number] Patient Account Number: [id number] Date of Birth/Sex: [birth year] (46 y.o. F) Treating RN: Cornell Barman Primary Care Provider: Raelene Bott Other Clinician: Massie Kluver Referring Provider: Raelene Bott Treating Provider/Extender: Yaakov Guthrie in Treatment: 12 Constitutional . Cardiovascular . Psychiatric . Notes Right foot: To the plantar heel there is an incision site with increased depth. Nonviable tissue at  the surface, callus present. No probing to bone. Left foot: To the medial aspect there is an open wound with granulation tissue and nonviable tissue. No surrounding signs of infection to any of the wound beds. Electronic Signature(s) Signed: 06/08/2022 1:41:56 PM By: Kalman Shan DO Entered By: Kalman Shan on 06/08/2022 12:58:03 [name] ([id number]) -------------------------------------------------------------------------------- Physician Orders Details Patient Name: Heather Dear A. Date of Service: 06/08/2022 11:15 AM Medical Record Number: [id number] Patient Account Number: [id number] Date of Birth/Sex: [birth year] (46 y.o. F) Treating RN: Cornell Barman Primary Care Provider: Raelene Bott Other Clinician: Massie Kluver Referring  Provider: Raelene Bott Treating Provider/Extender: Yaakov Guthrie in Treatment: 12 Verbal / Phone Orders: No Diagnosis Coding Follow-up Appointments o Return Appointment in 1 week. o Nurse Visit as needed [employer] o Wash wounds with antibacterial soap and water. o No tub bath. Anesthetic (Use 'Patient Medications' Section for Anesthetic Order Entry) o Lidocaine applied to wound bed Edema Control - Lymphedema / Segmental Compressive Device / Other o Elevate, Exercise Daily and Avoid Standing for Long Periods of Time. o Elevate legs to the level of the heart and pump ankles as often as possible o Elevate leg(s) parallel to the floor when sitting. Off-Loading o Total Contact Cast to Left Lower Extremity - contact cast #3 applied Don't get cast wet o Other: - keep pressure off of feet. Medications-Please add to medication list. o Keystone Compound - Keystone gel Wound Treatment Wound #11 - Foot Wound Laterality: Plantar, Right Cleanser: Soap and Water 1 x Per Day/30 Days Discharge Instructions: Gently cleanse wound with antibacterial soap, rinse and pat dry prior to dressing wounds Topical: keystone gel 1 x Per Day/30 Days Primary Dressing: Aquacel Extra Hydrofiber Dressing, 4x5 (in/in) (Generic) 1 x Per Day/30 Days Discharge Instructions: packed into wound Secondary Dressing: ABD Pad 5x9 (in/in) (Generic) 1 x Per Day/30 Days Discharge Instructions: Cover with ABD pad Secured With: Medipore Tape - 65M Medipore H Soft Cloth Surgical Tape, 2x2 (in/yd) (Generic) 1 x Per Day/30 Days Secured With: Kerlix Roll Sterile or Non-Sterile 6-ply 4.5x4 (yd/yd) (Generic) 1 x Per Day/30 Days Discharge Instructions: Apply Kerlix as directed Wound #12 - Foot Wound Laterality: Plantar, Left, Medial Cleanser: Soap and Water 1 x Per Day/30 Days Discharge Instructions: Gently cleanse wound with antibacterial soap, rinse and pat dry prior to dressing  wounds Topical: keystone gel 1 x Per Day/30 Days Discharge Instructions: apply very thin layer to wound Primary Dressing: Aquacel Extra Hydrofiber Dressing, 4x5 (in/in) (Generic) 1 x Per Day/30 Days Discharge Instructions: placed over keystone gel Secondary Dressing: ABD Pad 5x9 (in/in) (Generic) 1 x Per Day/30 Days Discharge Instructions: Cover with ABD pad [name], [name] ([id number]) Secured With: Medipore Tape - 65M Medipore H Soft Cloth Surgical Tape, 2x2 (in/yd) (Generic) 1 x Per Day/30 Days Secured With: Kerlix Roll Sterile or Non-Sterile 6-ply 4.5x4 (yd/yd) (Generic) 1 x Per Day/30 Days Discharge Instructions: Apply Kerlix as directed Electronic Signature(s) Signed: 06/08/2022 1:41:56 PM By: Kalman Shan DO Entered By: Kalman Shan on 06/08/2022 13:07:32 [name], [name] ([id number]) -------------------------------------------------------------------------------- Problem List Details Patient Name: Heather Dear A. Date of Service: 06/08/2022 11:15 AM Medical Record Number: [id number] Patient Account Number: [id number] Date of Birth/Sex: July 24, 1976 (46 y.o. 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 ([id number]) -------------------------------------------------------------------------------- Progress Note Details Patient Name: Heather Dear A. Date of Service: 06/08/2022 11:15 AM Medical Record Number: [id number] Patient Account Number: [id number] Date of Birth/Sex: 1976/03/21 (47 y.o. F) Treating RN: Cornell Barman Primary Care Provider: Raelene Bott Other Clinician: Massie Kluver Referring Provider: Raelene Bott Treating Provider/Extender: Yaakov Guthrie in Treatment: 12 Subjective Chief Complaint Information obtained from Patient 03/19/2021; patient referred by Dr. Luana Shu who has been looking after her left foot for quite a period of time for review of a nonhealing area in the left midfoot 03/12/2022; bilateral feet wounds and right lower extremity wound. History of Present Illness (HPI) 01/18/18-She is here for initial evaluation of the left great toe ulcer. She is a poor historian in regards to timeframe in detail. She states approximately 4 weeks ago she lacerated her toe on something in the house. She followed up with her primary care who placed her on Bactrim and ultimately a second dose of Bactrim prior to coming to wound clinic. She states she has been treating the toe with peroxide, Betadine and a Band-Aid. She did not check her blood sugar this morning but checked it yesterday morning it was 327; she is unaware of a recent A1c and there are no current records. She saw Dr. she would've orthopedics last week for an old injury to the left ankle, she states he did not see her toe, nor did she bring it to his attention. She smokes approximately 1 pack cigarettes a day. Her social situation is concerning, she arrives this morning with her mother who appears extremely intoxicated/under the influence; her mother was asked to leave the room and be monitored by  the patient's grandmother. The patient's aunt then accompanied the patient and the room throughout the rest of the appointment. We had a lengthy discussion regarding the deleterious effects of uncontrolled hyperglycemia and smoking as it relates to wound healing and overall health. She was strongly encouraged to decrease her smoking and get her diabetes under better control. She states she is currently on a diet and has cut down her [facility] consumption. The left toe is erythematous, macerated and slightly edematous with malodor present. The edema in her left foot is below her baseline, there is no erythema streaking. We will treat her with Santyl, doxycycline; we have ordered and xray, culture and provided a Peg assist surgical shoe and cultured the wound. 01/25/18-She is here in follow-up evaluation for a left great toe ulcer and presents with an abscess to her suprapubic area. She states her blood sugars remain elevated, feeling "sick" and if levels are below 250, but she is trying. She has made no attempt to decrease her smoking stating that we "can't take away her food in her cigarettes". She has been compliant with offloading using the PEG assist you. She is using Santyl daily. the culture obtained last week grew staph aureus and Enterococcus faecalis; continues on the doxycycline and Augmentin was added on Monday. The suprapubic area has  erythema, no femoral variation, purple discoloration, minimal induration, was accessed with a cotton tip applicator with sanguinopurulent drainage, this was cultured, I suspect the current antibiotic treatment will cover and we will not add anything to her current treatment plan. She was advised to go to urgent care or ER with any change in redness, induration or fever. 02/01/18-She is here in follow-up evaluation for left great toe ulcers and a new abdominal abscess from last week. She was able to use packing until earlier this week, where she "forgot it was  there". She states she was feeling ill with GI symptoms last week and was not taking her antibiotic. She states her glucose levels have been predominantly less than 200, with occasional levels between 200-250. She thinks this was contributing to her GI symptoms as they have resolved without intervention. There continues to be significant laceration to left toe, otherwise it clinically looks stable/improved. There is now less superficial opening to the lateral aspect of the great toe that was residual blister. We will transition to [facility] to all wounds, she will continue her Augmentin. If there is no change or deterioration next week for reculture. 02/08/18-She is here in follow-up evaluation for left great toe ulcer and abdominal ulcer. There is an improvement in both wounds. She has been wrapping her left toe with coban, not by our direction, which has created an area of discoloration to the medial aspect; she has been advised to NOT use coban secondary to her neuropathy. She states her glucose levels have been high over this last week ranging from 200-350, she continues to smoke. She admits to being less compliant with her offloading shoe. We will continue with same treatment plan and she will follow-up next week. 02/15/18-She is here in follow-up evaluation for left great toe ulcer and abdominal ulcer. The abdominal ulcer is epithelialized. The left great toe ulcer is improved and all injury from last week using the Coban wrap is resolved, the lateral ulcer is healed. She admits to noncompliance with wearing offloading shoe and admits to glucose levels being greater than 300 most of the week. She continues to smoke and expresses no desire to quit. There is one area medially that probes deeper than it has historically, erythema to the toe and dorsal foot has consistently waxed and waned. There is no overt signs of cellulitis or infection but we will culture the wound for any occult infection  given the new area of depth and erythema. We will hold off on sensitivities for initiation of antibiotic therapy. 02/22/18-She is here in follow up evaluation for left great toe ulcer. There is overall significant improvement in both wound appearance, erythema and edema with changes made last week. She was not initiated on antibiotic therapy. Culture obtained last week showed oxacillin sensitive staph aureus, sensitive to clindamycin. Clindamycin has been called into the pharmacy but she has been instructed to hold off on initiation secondary to overall clinical improvement and her history of antibiotic intolerance. She has been instructed to contact the clinic with any noted changes/deterioration and the wound, erythema, edema and/or pain. She will follow-up next week. She continues to smoke and her glucose levels remain elevated >250; she admits to compliance with offloading shoe 03/01/18 on evaluation today patient appears to be doing fairly well in regard to her left first toe ulcer. She has been tolerating the dressing changes with the [name] Dressing without complication and overall this has definitely showed signs of improvement according to records as well  is what the patient tells me today. I'm very pleased in that regard. She is having no pain today 03/08/18 She is here for follow up evaluation of a left great toe ulcer. She remains non-compliant with glucose control and smoking cessation; glucose levels consistently >200. She states that she got new shoe inserts/peg assist. She admits to compliance with offloading. Since my last evaluation there is significant improvement. We will switch to prisma at this time and she will follow up next week. She is noted to be tachycardic at this appointment, heart rate 120s; she has a history of heart rate 70-130 according to our records. She admits to extreme agitation r/t personal issues; she was advised to monitor her heartrate and contact her  physician if it does not return to a more normal range (<100). She takes cardizem twice daily. 03/15/18-She is here in follow-up evaluation for left great toe ulcer. She remains noncompliant with glucose control and smoking cessation. She admits to compliance with wearing offloading shoe. The ulcer is improved/stable and we will continue with the same treatment plan and she will follow-up next week 03/22/18-She is here for evaluation for left great toe ulcer. There continues to be significant improvement despite recurrent hyperglycemia (over 500 yesterday) and she continues to smoke. She has been compliant with offloading and we will continue with same treatment plan and she will follow-up next week. Kunsman, Tanetta A. ([id number]) 03/29/18-She is here for evaluation for left great toe ulcer. Despite continuing to smoke and uncontrolled diabetes she continues to improve. She is compliant with offloading shoe. We will continue with the same treatment plan and she will follow-up next week 04/05/18- She is here in follow up evaluation for a left great toe ulcer; she presents with small pustule to left fifth toe (resembles ant bite). She admits to compliance with wearing offloading shoe; continues to smoke or have uncontrolled blood glucose control. There is more callus than usual with evidence of bleeding; she denies known trauma. 04/12/18-She is here for evaluation of left great toe ulcer. Despite noncompliance with glycemic control and smoking she continues to make improvement. She continues to wear offloading shoe. The pustule, that was identified last week, to the left fifth toe is resolved. She will follow-up in 2 weeks 05/03/18-she is seen in follow-up evaluation for a left great toe ulcer. She is compliant with offloading, otherwise noncompliant with glycemic control and smoking. She has plateaued and there is minimal improvement noted. We will transition to [facility], replaced the insert to  her surgical shoe and she will follow-up in one week 05/10/18- She is here in follow up evaluation for a left great toe ulcer. It appears stable despite measurement change. We will continue with same treatment plan and follow up next week. 05/24/18-She is seen in follow-up evaluation for a left great toe ulcer. She remains compliant with offloading, has made significant improvement in her diet, decreasing the amount of sugar/soda. She said her recent A1c was 10.9 which is lower than. She did see a diabetic nutritionist/educator yesterday. She continues to smoke. We will continue with the same treatment plan and she'll follow-up next week. 05/31/18- She is seen in follow-up evaluation for left great toe ulcer. She continues to remain compliant with offloading, continues to make improvement in her diet, increasing her water and decreasing the amount of sugar/soda. She does continue to smoke with no desire to quit. We will apply Prisma to the depth and Hydrofera Blue over. We have not received insurance authorization  for oasis. She will follow up next week. 06/07/18-She is seen in follow-up evaluation for left great toe ulcer. It has stalled according to today's measurements although base appears stable. She says she saw a diabetic educator yesterday; her average blood sugars are less than 300 which is an improvement for her. She continues to smoke and states "that's my next step" She continues with water over soda. We will order for xray, culture and reinstate ace wrap compression prior to placing apligraf for next week. She is voicing no complaints or concerns. Her dressing will change to iodoflex over the next week in preparation for apligraf. 06/14/18-She is seen in follow-up evaluation for left great toe ulcer. Plain film x-ray performed last week was negative for osteomyelitis. Wound culture obtained last week grew strep B and OSSA; she is initiated on keflex and cefdinir today; there is erythema to the  toe which could be from ace wrap compression, she has a history of wrapping too tight and has has been encouraged to maintain ace wraps that we place today. We will hold off on application of apligraf today, will apply next week after antibiotic therapy has been initiated. She admits today that she has resumed taking a shower with her foot/toe submerged in water, she has been reminded to keep foot/toe out of the bath water. She will be seen in follow up next week 06/21/18-she is seen in follow-up evaluation for left great toe ulcer. She is tolerating antibiotic therapy with no GI disturbance. The wound is stable. Apligraf was applied today. She has been decreasing her smoking, only had 4 cigarettes yesterday and 1 today. She continues being more compliant in diabetic diet. She will follow-up next week for evaluation of site, if stable will remove at 2 weeks. 06/28/18- She is here in follow up evalution. Apligraf was placed last week, she states the dressing fell off on Tuesday and she was dressing with hydrofera blue. She is healed and will be discharged from the clinic today. She has been instructed to continue with smoking cessation, continue monitoring glucose levels, offloading for an additional 4 weeks and continue with hydrofera blue for additional two weeks for any possible microscopic opening. Readmission: 08/07/18 on evaluation today patient presents for reevaluation concerning the ulcer of her right great toe. She was previously discharged on 06/28/18 healed. Nonetheless she states that this began to show signs of drainage she subsequently went to her primary care provider. Subsequently an x-ray was performed on 08/01/18 which was negative. The patient was also placed on antibiotics at that time. Fortunately they should have been effective for the infection. Nonetheless she's been experiencing some improvement but still has a lot of drainage coming from the wound itself. 08/14/18 on evaluation  today patient's wound actually does show signs of improvement in regard to the erythema at this point. She has completed the antibiotics. With that being said we did discuss the possibility of placing her in a total contact cast as of today although I think that I may want to give this just a little bit more time to ensure nothing recurrence as far as her infection is concerned. I do not want to put in the cast and risk infection at that time if things are not completely resolved. With that being said she is gonna require some debridement today. 08/21/18 on evaluation today patient actually appears to be doing okay in regard to her toe ulcer. She's been tolerating the dressing changes without complication. With that being said it  does appear that she is ready and in fact I think it's appropriate for [place] to go ahead and initiate the total contact cast today. Nonetheless she will require some sharp debridement to prepare the wound for application. Overall I feel like things have been progressing well but we do need to do something to get this to close more readily. 08/24/18 patient seen today for reevaluation after having had the total contact cast applied on Tuesday. She seems to have done very well the wound appears to be doing great and overall I'm pleased with the progress that she's made. There were no abnormal areas of rubbing from the cast on her lower extremity. 08/30/18 on evaluation today patient actually appears to be completely healed in regard to her plantar toe ulcer. She tells me at this point she's been having a lot of issues with the cast. She almost fell a couple of times the state shall the step of her dog a couple times as well. This is been a very frustrating process for her other nonetheless she has completely healed the wound which is excellent news. Overall there does not appear to be the evidence of infection at this time which is great news. 09/11/18 evaluation today patient  presents for follow-up concerning her great toe ulcer on the left which has unfortunately reopened since I last saw her which was only a couple of weeks ago. Unfortunately she was not able to get in to get the shoe and potentially the AFO that's gonna be necessary due to her left foot drop. She continues with offloading shoe but this is not enough to prevent her from reopening it appears. When we last had her in the total contact cast she did well from a healing standpoint but unfortunately the wound reopened as soon as she came out of the cast within just a couple of weeks. 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 This is also part of the reason why she falls on a regular basis. I just do not believe that is gonna be safe for [place] to reinitiate the total contact cast as last time we had this on she fell 3 times one week which is definitely not normal for her. 09/18/18 upon evaluation today the patient actually appears to be doing about the same in regard to her toe ulcer. She did not contact [facility] as I asked her to even though I had given her the prescription. In fact she actually states that she has no idea where the prescription is. She did apparently call [facility] and they told her that all she needed to do was bring the prescription in order to be able to be seen and work on getting the AFO for her left foot. With all that being said she still does not have an appointment and I'm not sure were things stand that regard. I will give her a new prescription today in order to contact them to get this set up. MILEENA, ROTHENBERGER A. ([id number]) 09/25/18 on evaluation today patient actually appears to be doing about the same in regard to her toes ulcer. She does have a small areas which seems to have a lot of  callous buildup around the edge of the wound which is going to need sharp debridement today.  She still is waiting to be scheduled for evaluation with [facility] for possibility of an AFO. She states there supposed to call her tomorrow to get this set up. Unfortunately it does appear that her foot specifically the toe area is showing signs of erythema. There does not appear to be any systemic infection which is in these good news. 10/02/18 on evaluation today patient actually appears to be doing about the same in regard to her toe ulcer. This really has not done too well although it's not significantly larger it's also not significantly smaller. She has been tolerating the dressing changes without complication. She actually has her appointment with [facility] and [facility] tomorrow to hopefully be measured for obtaining and AFO splint. I think this would be helpful preventing this from reoccurring. We had contemplated starting the cast this week although to be honest I am reluctant to do that as she's been having nausea, vomiting, and seizure activity over the past three days. She has a history of seizures and have been told is nothing that can be done for these. With that being said I do believe that along with the seizures have the nausea vomiting which upon further questioning doesn't seem to be the normal for her and makes me concerned for the possibility of infection or something else going on. I discussed this with the patient and her mother during the office visit today. I do not feel the wound is effective but maybe something else. The responses this was "this just happens to her at times and we don't know why". They did not seem to be interested in going to the hospital to have this checked out further. 10/09/18 on evaluation today patient presents for follow-up concerning her ongoing toe ulcer. She has been tolerating the dressing changes without complication. Fortunately there does not appear to be  any evidence of infection which is great news however I do think that the patient would benefit from going ahead for with the total contact cast. She's actually in a wheelchair today she tells me that she will use her walker if we initiate the cast. I was very specific about the fact that if we were gonna do the cast I wanted to make sure that she was using the walker in order to prevent any falls. She tells me she does not have stairs that she has to traverse on a regular basis at her home. She has not had any seizures since last week again that something that happens to her often she tells me she did talk to [employer] and they said that it may take up to three weeks to get the brace approved for her. Hopefully that will not take that long but nonetheless in the meantime I do think the cast could be of benefit. 10/12/18 on evaluation today patient appears to be doing rather well in regard to her toe ulcer. It's just been a few days and already this is significantly improved both as far as overall appearance and size. Fortunately there's no sign of infection. She is here for her first obligatory cast change. 10/19/18 Seen today for follow up and management of left great toe ulcer. Wound continues to show improvement. Noted small open area with seroussang drainage with palpation. Denies any increased pain or recent fevers during visit. She will continue calcium alginate with offloading shoe. Denies any questions or concerns during visit. 10/26/18 on evaluation today patient appears to be doing about the same as when I last saw her in  regard to her wound bed. Fortunately there does not appear to be any signs of infection. Unfortunately she continues to have a breakdown in regard to the toe region any time that she is not in the cast. It takes almost no time at all for this to happen. Nonetheless she still has not heard anything from the brace being made by [employer] as to when exactly this will be available  to her. Fortunately there is no signs of infection at this time. 10/30/18 on evaluation today patient presents for application of the total contact cast as we just received him this morning. Fortunately we are gonna be able to apply this to her today which is great news. She continues to have no significant pain which is good news. Overall I do feel like things have been improving while she was the cast is when she doesn't have a cast that things get worse. She still has not really heard anything from [facility] regarding her brace. 11/02/18 upon evaluation today patient's wound already appears to be doing significantly better which is good news. Fortunately there does not appear to be any signs of infection also good news. Overall I do think the total contact cast as before is helping to heal this area unfortunately it's just not gonna likely keep the area closed and healed without her getting her brace at least. Again the foot drop is a significant issue for her. 11/09/18 on evaluation today patient appears to be doing excellent in regard to her toe ulcer which in fact is completely healed. Fortunately we finally got the situation squared away with the paperwork which was needed to proceed with getting her brace approved by Medicaid. I have filled that out unfortunately that information has been sent to the orthopedic office that I worked at 2 1/2 years ago and not tired Current wound care measures. Fortunately she seems to be doing very well at this time. 11/23/18 on evaluation today patient appears to be doing More Poorly Compared to Last Time I Saw Her. At [facility] She Had Completely Healed. Currently she is continuing to have issues with reopening. She states that she just found out that the brace was approved through Medicaid now she just has to go get measured in order to have this fitted for her and then made. Subsequently she does not have an appointment for this yet that is going to complicate  things we obviously cannot put her back in the cast if we do not have everything measured because they're not gonna be able to measure her foot while she is in the cast. 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 Obviously this is unfortunate and does have me somewhat worried as well. 11/30/18 on evaluation today patient's toe ulcer actually appears to be doing fairly well. The good news is she will be getting her brace in the shoes next week on Wednesday. Hopefully we will be able to get this to heal without having to go back in the cast however she may need the cast in order to get the wound completely heal and then go from there. Fortunately there's no signs of infection at this time. 12/07/18 on evaluation today patient fortunately did receive her brace and she states she could tell this definitely makes her walk better. With that being said she's been having issues with her toe where she noticed yesterday there  was a lot of tissue that was loosing off this appears to be much larger than what it was previous. She also states that her leg has been read putting much across the top of her foot just about the ankle although this seems to be receiving somewhat. The total area is still red and appears to be someone infected as best I can tell. She is previously taken Bactrim and that may be a good option for her today as well. We are gonna see what I wound culture shows as well and I think that this is definitely appropriate. With that being said outside of the culture I still need to initiate something in the interim and that's what I'm gonna go ahead and select Bactrim is a good option for her. 12/14/18 on evaluation today patient appears to be doing better in regard to her left great toe ulcer as compared to last week's evaluation. There's still some erythema although this is significantly improved which is  excellent news. Overall I do believe that she is making good progress is still gonna take some time before she is where I would like her to be from the standpoint of being able to place her back into the total contact cast. Hopefully we will be where we need to be by next week. 12/21/18 on evaluation today patient actually appears to be doing poorly in regard to her toe ulcer. She's been tolerating the dressing changes without complication. Fortunately there's no signs of systemic infection although she does have a lot of drainage from the toe ulcer and this does Bonawitz, Wonda A. ([id number]) seem to be causing some issues at this point. She does have erythema on the distal portion of her toe that appears to be likely cellulitis. 12/28/18 on evaluation today patient actually appears to be doing a little better in my pinion in regard to her toe ulcer. With that being said she still does have some evidence of infection at this time and for her culture she had both E. coli as well as enterococcus as organisms noted on evaluation. For that reason I think that though the Keflex likely has treated the E. coli rather well this has really done nothing for the enterococcus. We are going to have to initiate treatment for this specifically. 01/04/19 on evaluation today patient's toe actually appears to be doing better from the standpoint of infection. She currently would like to see about putting the cash back on I think that this is appropriate as long as she takes care of it and keeps it from getting wet. She is gonna have some drainage we can definitely pass this up with Drawtex and alginate to try to prevent as much drainage as possible from causing the problems. With that being said I do want to at least try her with the cast between now and Tuesday. If there any issues we can't continue to use it then I will discontinue the use of the cast at that point. 01/08/19 on evaluation today patient actually appears to  be doing very well as far as her foot ulcer specifically the great toe on the left is concerned. She did have an area of rubbing on the medial aspect of her left ankle which again is from the cast. Fortunately there's no signs of infection at this point in this appears to be a very slight skin breakdown. The patient tells me she felt it rubbing but didn't think it was that bad. Fortunately there is no signs  of active infection at this time which is good news. No fevers, chills, nausea, or vomiting noted at this time. 01/15/19 on evaluation today patient actually appears to be doing well in regard to her toe ulcer. Again as previous she seems to do well and she has the cast on which indicates to me that during the time she doesn't have a cast on she's putting way too much pressure on this region. Obviously I think that's gonna be an issue as with the current national emergency concerning the Covid-19 Virus it has been recommended that we discontinue the use of total contact casting by the chief medical officer of our company, Dr. Simona Huh. The reasoning is that if a patient becomes sick and cannot come into have the cast removed they could not just leave this on for an additional two weeks. Obviously the hospitals also do not want to receive patient's who are sick into the emergency department to potentially contaminate the region and spread the Covid-19 Virus among other sick individuals within the hospital system. Therefore at this point we are suspending the use of total contact cast until the current emergency subsides. This was all discussed with the patient today as well. 01/22/19 on evaluation today patient's wound on her left great toe appears to be doing slightly worse than previously noted last week. She tells me that she has been on this quite a bit in fact she tells me she's been awake for 38 straight hours. This is due to the fact that she's having to care for grandparents because nobody else  will. She has been taking care of them for five the last seven days since I've seen her they both have dementia his is from a stroke and her grandmother's was progressive. Nonetheless she states even her mom who knows her condition and situation has only help two of those days to take care of them she's been taking care of the rest. Fortunately there does not appear to be any signs of active infection in regard to her toe at this point although obviously it doesn't look as good as it did previous. I think this is directly related to her not taking off the pressure and friction by way of taking things easy. Though I completely understand what's going on. 01/29/19 on evaluation today patient's tools are actually appears to be showing some signs of improvement today compared to last week's evaluation as far as not necessarily the overall size of the wound but the fact that she has some new skin growth in between the two ends of the wound opening. Overall I feel like she has done well she states that she had a family member give her what sounds to be a CAM walker boot which has been helpful as well. 02/05/19 on evaluation today patient's wound bed actually appears to be doing significantly better in regard to her overall appearance of the size of the wound. With that being said she is still having an issue with offloading efficiently enough to get this to close. Apparently there is some signs of infection at this point as well unfortunately. Previously she's done well of Augmentin I really do not see anything that needs to be culture currently but there theme and cellulitis of the foot that I'm seeing I'm gonna go ahead and place her on an antibiotic today to try to help clear this up. 02/12/2019 on evaluation today patient actually appears to be doing poorly in regard to her overall wound status. She  tells me she has been using her offloading shoe but actually comes in today wearing her tennis shoe with the  AFO brace. Again as I previously discussed with her this is really not sufficient to allow the area to heal appropriately. Nonetheless she continues to be somewhat noncompliant and I do wonder based on what she has told my nurse in the past as to whether or not a good portion of this noncompliance may be recreational drug and alcohol related. She has had a history of heroin overdose and this was fairly recently in the past couple of months that have been seeing her. Nonetheless overall I feel like her wound looks significantly worse today compared to what it was previous. She still has significant erythema despite the Augmentin I am not sure that this is an appropriate medication for her infection I am also concerned that the infection may have gone down into her bone. 02/19/19 on evaluation today patient actually appears to be doing about the same in regard to her toe ulcer. Unfortunately she continues to show signs of bone exposure and infection at this point. There does not appear to be any evidence of worsening of the infection but I'm also not really sure that it's getting significantly better. She is on the Augmentin which should be sufficient for the Staphylococcus aureus infection that she has at this point. With that being said she may need IV antibiotics to more appropriately treat this. We did have a discussion today about hyperbaric option therapy. 02/28/19 on evaluation today patient actually appears to be doing much worse in regard to the wound on her left great toe as compared to even my previous evaluation last week. Unfortunately this seems to be training in a pretty poor direction. Her toe was actually now starting to angle laterally and I can actually see the entire joint area of the proximal portion of the digit where is the distal portion of the digit again is no longer even in contact with the joint line. Unfortunately there's a lot more necrotic tissue around the edge and the toe  appears to be showing signs of becoming gangrenous in my pinion. I'm very concerned about were things stand at this point. She did see infectious disease and they are planning to send in a prescription for Sivextro for her and apparently this has been approved. With that being said I don't think she should avoid taking this but at the same time I'm not sure that it's gonna be sufficient to save her toe at this point. 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 03/05/19 on evaluation today patient actually appears to be doing quite well in regard to her toe all things considering. She still has bone exposed but there appears to be much less your thing on overall the appearance of the wound and the toe itself is dramatically improved. She still does have some issues currently obviously with infection she did see vascular as well and there concerned that her blood flow to the toad. For that reason they are setting up for an angiogram next week. 03/14/19 on evaluation today patient appears to be doing very poor in regard to her toe and specifically in regard to the ulceration and the fact that she's starting to notice the  toe was leaning even more towards the lateral aspect and the complete joint is visible on the proximal aspect of the joint. Nonetheless she's also noted a significant odor and the tip of the toe is turning more dark and necrotic appearing. Overall I think she is getting worse not better as far as this is concerned. For that reason I am recommending at this point that she likely needs to be seen for likely amputation. [name], [name] ([id number]) READMISSION 03/19/2021 This is a patient that we cared for in this clinic for a prolonged period of time in 2019 and 2020 with a left foot and  left first toe wound. 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 Luana Shu. In December 2021 she had an ulcer on her right great toe as well as the fourth and fifth toes. 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 She also had an angiogram at that time and underwent angioplasty of the right anterior tibial artery. In any case she claims that the wound on the right foot is closed I did not look at this today which was probably an oversight although I think that should be done next week. After her surgery she developed a dehiscence but I do not see any follow-up of this. According to Dr. Deborra Medina last review that she was out of the area being cared for by another physician but recently came back to his attention. 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 A culture of this area showed E. coli apparently before she came back to see Dr. Luana Shu she was supposed to be receiving antibiotics but she did not really take them. Nor is she offloading this area at all. Finally her last hemoglobin A1c listed in epic was in March 2022 at 14.1 she says things are a lot better since then although I am not sure. 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 She had this wound at the time. During this admission she had E. coli sepsis a TEE was negative. 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 She is still a heavy cigarette smoker. She has a history of bipolar disorder seizure disorder ABI in our clinic was 1.05 6/1; left  midfoot in the setting of a TMA done previously. Round circular wound with a "knuckle" of protruding tissue. The problem is that the knuckle was not attached to any of the surrounding granulation and this probed proximally widely I removed a large portion of this tissue. This wound goes with considerable undermining laterally. I do not feel any bone there was no purulence but this is a deep wound. 6/8; in spite of the debridement I did last week. She arrives with a wound looking exactly the same. A protruding "knuckle" of tissue nonadherent to most of the surrounding tissue. There is considerable depth around this from 6-12 o'clock at 2.7 cm and undermining of 1 cm. This does not look overtly infected and the x-ray I did last week was negative for any osseous abnormalities. We have been using silver collagen 6/15; deep tissue culture I did last  week showed moderate staph aureus and moderate Pseudomonas. This will definitely require prolonged antibiotic therapy. The pathology on the protuberant area was negative for malignancy fungus etc. 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 We have been using silver collagen. I am going to be prescribing Levaquin for 2 weeks. Her CT scan of the foot is down for 7/5 6/22; CT scan of the foot on 7 5. She says she has hardware in the left leg from her previous fracture. She is on the Levaquin for the deep tissue culture I did that showed methicillin sensitive staph aureus and Pseudomonas. I gave her a 2-week supply and she will have another week. She arrives in clinic today with the same protuberant tissue however this is nonadherent to the tissue surrounding it. I am really at a loss to explain this unless there is underlying deep tissue infection 6/29; patient presents for 1 week follow-up. She has been using collagen to the wound bed. She reports taking her antibiotics as prescribed.She has no complaints  or issues today. She denies signs of infection. 7/6; patient presents for one week followup. She has been using collagen to the wound bed. She states she is taking Levaquin however at times she is not able to keep it down. She denies signs of infection. 7/13; patient presents for 1 week follow-up. She has been using silver alginate to the wound bed. She still has nausea when taking her antibiotics. She denies signs of infection. 7/20; patient presents for 1 week follow-up. She has been using silver alginate with gentamicin cream to the wound bed. She denies any issues and has no complaints today. She denies signs of infection. 7/27; patient presents for 1 week follow-up. She continues to use silver alginate with gentamicin cream to the wound bed. She reports starting her antibiotics. She has no issues or complaints. Overall she reports stability to the wound. 8/3; patient presents for 1 week follow-up. She has been using silver alginate with gentamicin cream to the wound bed. She reports completing all antibiotics. She has no issues or complaints today. She denies signs of infection. 8/17; patient presents for 2-week follow-up. He is to use silver alginate to the wound bed. She has no issues or complaints today. She denies signs of infection. She reports her pain has improved in her foot since last clinic visit 8/24; patient presents for 1 week follow-up. She continues to use silver alginate to the wound bed. She has no issues or complaints. She denies signs of infection. Pain is stable. 9/7; patient presents for follow-up. She missed her last week appointment due to feeling ill. She continues to use silver alginate. She has a new wound to the right lower extremity that is covered in eschar. She states It occurred over the past week and has no idea how it started. She currently denies signs of infection. 9/14; patient presents for follow-up. To the left foot wound she has been using gentamicin  cream and silver alginate. To the right lower extremity wound she has been keeping this covered and has not obtain Santyl. 9/21; patient presents for follow-up. She reports using gentamicin cream and silver alginate to the left foot and Santyl to the right lower extremity wound. She has no issues or complaints today. She denies signs of infection. 9/28; patient presents for follow-up. She reports a new wound to her right heel. She states this occurred a few days ago and is progressively gotten worse. She has  been trying to clean the area with a Q-tip and Santyl. She reports stability in the other 2 wounds. She has been using gentamicin cream and silver alginate to the left foot and Santyl to the right lower extremity wound. 10/12; patient presents for follow-up. She reports improvement to the wound beds. She is seeing vein and vascular to discuss the potential of a left BKA. She states they are going to do an arteriogram. She continues to use silver alginate with dressing changes to her wounds. 11/2; patient presents for follow-up. She states she has not been doing dressing changes to the wound beds. She states she is not able to offload the areas. She reports chronic pain to her left foot wound. 11/9; patient presents for follow-up. She came in with only socks on. She states she forgot to put on shoes. It is unclear if she is doing any dressing changes. She currently denies systemic signs of infection. 11/16; patient presents for follow-up. She came again only with socks on. She states she does not wear shoes ever. It is unclear if she does dressing changes. She currently denies systemic signs of infection. 11/23; patient presents for follow-up. She wore her shoes today. It still unclear exactly what dressing she is using for each wound but she did states she obtained Dakin's solution and has been using this to the left foot wound. She currently denies signs of infection. DACOTA, DEVALL  ([id number]) 11/30; patient presents for follow-up. She has no issues or complaints today. She currently denies signs of infection. 12/7; patient presents for follow-up. She has no issues or complaints today. She has been using Hydrofera Blue to the right heel wound and Dakin solution to the left foot wound. Her right anterior leg wound is healed. She currently denies signs of infection. 12/14; patient presents for follow-up. She has been using Hydrofera Blue to the right heel and Dakin's to the left foot wounds. She has no issues or complaints today. She denies signs of infection. 12/21; patient presents for follow-up. She reports using Hydrofera Blue to the right heel and Dakin's to the left foot wound. She denies signs of infection. 12/28; patient presents for follow-up. She continues to use Dakin's to the left foot wound and Hydrofera Blue to the right heel wound. She denies signs of infection. 1/4; patient presents for follow-up. She has no issues or complaints today. She denies signs of infection. 1/11; patient presents for follow-up. It is unclear if she has been dressing these wounds over the past week. She currently denies signs of infection. 1/18; patient presents for follow-up. She states she has been using Dakin's wet-to-dry dressings to the left foot. She has been using Hydrofera Blue to the right foot foot wound. She states that the anterior right leg wound has reopened and draining serous fluid. She denies signs of infection. 1/25; patient presents for follow-up. She has no issues or complaints today. 2/1; patient presents for follow-up. She has no issues or complaints today. She denies signs of infection. 2/8; patient presents for follow-up. She has lost her surgical shoes. She did not have a dressing to the right heel wound. She currently denies signs of infection. 2/15; patient presents for follow-up. She reports more pain to the right heel today. She denies purulent drainage Or  fever/chills 2/22; patient presents for follow-up. She reports taking clindamycin over the past week. She states that she continues to have pain to her right heel. She reports purulent drainage. Readmission 03/16/2022 Ms. Quentina Fronek  is a 46 year old female with a past medical history of type 2 diabetes, osteomyelitis to her feet, chronic systolic heart failure and bipolar disorder that presents to the clinic for bilateral feet wounds and right lower extremity wound. She was last seen in our clinic on 12/15/2021. At that time she had purulent drainage coming out of her right plantar foot and I recommended she go to the ED. She states she went to [facility] and has been there for the past 3 months. I cannot see the records. She states she had OR debridement and was on several weeks of IV antibiotics while inpatient. Since discharge she has not been taking care of the wound beds. She had nothing on her feet other than socks today. She currently denies signs of infection. 5/31; patient presents for follow-up. She has been using Dakin's wet-to-dry dressings to the wound beds on her feet bilaterally and antibiotic ointment to the right anterior leg wound. She had a wound culture done at last clinic visit that showed moderate Pseudomonas aeruginosa sensitive to ciprofloxacin. She currently denies systemic signs of infection. 6/14; patient presents for follow-up. She received Keystone 5 days ago and has been using this on the wound beds. She states that last week she had to go to the hospital because she had increased warmth and erythema to the right foot. She was started on 2 oral antibiotics. She states she has been taking these. She currently denies systemic signs of infection. She has no issues or complaints today. 6/21; patient presents for follow-up. She states she has been using Keystone antibiotics to the wound beds. She has no issues or complaints today. She denies signs of  infection. 6/28; patient presents for follow-up. She has been using Keystone antibiotics to the wound beds. She has no issues or complaints today. 7/12; patient presents for follow-up. Has been using Keystone antibiotics to the wound beds with calcium alginate. She has no issues or complaints today. She never followed up with her orthopedic surgeon who did the OR debridement to the right foot. We discussed the total contact cast for the left foot and patient would like to do this next week. 7/19; patient presents for follow-up. She has been using Keystone antibiotics with calcium alginate to the wound beds. She has no issues or complaints today. Patient is in agreement to do the total contact cast of the left foot today. She knows to return later this week for the obligatory cast change. 05-13-2022 upon evaluation today patient's wound which she has the cast of the left leg actually appears to be doing significantly better. Fortunately I do not see any signs of active infection locally or systemically which is great news and overall I am extremely pleased with where we stand currently. 7/26; patient presents for follow-up. She has a cast in place for the past week. She states it irritated her shin. Other than that she tolerated the cast well. She states she would like a break for 1 week from the cast. We have been using Keystone antibiotic and Aquacel to both wound beds. She denies signs of infection. 8/2; patient presents for follow-up. She has been using Keystone and Aquacel to the wound beds. She denies any issues and has no complaints. She is agreeable to have the cast placed today for the left leg. 06-03-2022 upon evaluation today patient appears to be doing well with regard to her wound she saw some signs of improvement which is great news. Fortunately I do not see  any evidence of active infection locally or systemically at this time which is great news. No fevers, chills, nausea, vomiting, or  diarrhea. LETASHA, KERSHAW A. ([id number]) 8/16; patient presents for follow-up. She has no issues or complaints today. We have been using Keystone and Aquacel to the wound beds. The left lower extremity is in a total contact cast. She is tolerated this well. Objective Constitutional Vitals Time Taken: 12:27 PM, Temperature: 98.4 F, Pulse: 57 bpm, Respiratory Rate: 16 breaths/min, Blood Pressure: 104/62 mmHg. General Notes: Right foot: To the plantar heel there is an incision site with increased depth. Nonviable tissue at the surface, callus present. No probing to bone. Left foot: To the medial aspect there is an open wound with granulation tissue and nonviable tissue. No surrounding signs of infection to any of the wound beds. Integumentary (Hair, Skin) Wound #11 status is Open. Original cause of wound was Surgical Injury. The date acquired was: 12/01/2021. The wound has been in treatment 12 weeks. The wound is located on the [place]. The wound measures 0.7cm length x 0.9cm width x 0.5cm depth; 0.495cm^2 area and 0.247cm^3 volume. There is Fat Layer (Subcutaneous Tissue) exposed. There is a medium amount of serosanguineous drainage noted. The wound margin is distinct with the outline attached to the wound base. There is medium (34-66%) pink, pale granulation within the wound bed. There is a medium (34-66%) amount of necrotic tissue within the wound bed including Adherent Slough. Wound #12 status is Open. Original cause of wound was Pressure Injury. The date acquired was: 03/16/2020. The wound has been in treatment 12 weeks. The wound is located on the [place]. The wound measures 4cm length x 0.8cm width x 0.2cm depth; 2.513cm^2 area and 0.503cm^3 volume. There is Fat Layer (Subcutaneous Tissue) exposed. There is a medium amount of serous drainage noted. The wound margin is flat and intact. There is large (67-100%) red, pink granulation within the wound bed. There is a  small (1-33%) amount of necrotic tissue within the wound bed including Adherent Slough. 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 I debrided nonviable tissue. I recommended continuing the course with calcium alginate and Keystone antibiotics to the wound bed. The total contact cast was placed in standard fashion today to the left lower extremity. She knows to not get this wet and come back in 1 week. Procedures Wound #11 Pre-procedure diagnosis of Wound #11 is an Open Surgical Wound located on the Right,Plantar Foot . There was a Excisional Skin/Subcutaneous Tissue Debridement with a total area of 0.63 sq cm performed by Kalman Shan, MD. With the following instrument(s): Curette to remove Viable and Non-Viable tissue/material. Material removed includes Subcutaneous Tissue and Slough and. A time out was conducted at 12:52, prior to the start of the procedure. A Minimum amount of bleeding was controlled with Pressure. The procedure was tolerated well. Post Debridement Measurements: 0.7cm length x 0.9cm width x 0.6cm depth; 0.297cm^3 volume. Character of Wound/Ulcer Post Debridement is stable. Post procedure Diagnosis Wound #11: Same as Pre-Procedure Wound #12 Pre-procedure diagnosis of Wound #12 is a Diabetic Wound/Ulcer of the Lower Extremity located on the  Left,Medial,Plantar Foot .Severity of Tissue Pre Debridement is: Fat layer exposed. There was a Excisional Skin/Subcutaneous Tissue Debridement with a total area of 3.2 sq cm performed by Kalman Shan, MD. With  the following instrument(s): Curette to remove Viable and Non-Viable tissue/material. Material removed Burkard, Makaila A. ([id number]) includes Subcutaneous Tissue and Slough and. A time out was conducted at 12:45, prior to the start of the procedure. A Minimum amount of bleeding was controlled with Pressure. The procedure was tolerated well. Post Debridement Measurements: 4cm length x 0.8cm width x 0.2cm depth; 0.503cm^3 volume. Character of Wound/Ulcer Post Debridement is stable. Severity of Tissue Post Debridement is: Fat layer exposed. 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 There was a Total Contact Cast Procedure by Kalman Shan, MD. Post procedure Diagnosis Wound #12: Same as Pre-Procedure Plan Follow-up Appointments: Return Appointment in 1 week. Nurse Visit as needed Bathing/ Shower/ Hygiene: Wash wounds with antibacterial soap and water. No tub bath. Anesthetic (Use 'Patient Medications' Section for Anesthetic Order Entry): Lidocaine applied to wound bed Edema Control - Lymphedema / Segmental Compressive Device / Other: Elevate, Exercise Daily and Avoid Standing for Long Periods of Time. Elevate legs to the level of the heart and pump ankles as often as possible Elevate leg(s) parallel to the floor when sitting. Off-Loading: Total Contact Cast to Left Lower Extremity - contact cast #3 applied Don't get cast wet Other: - keep pressure off of feet. Medications-Please add to medication list.: [employer] - Keystone gel WOUND #11: - Foot Wound Laterality: Plantar, Right Cleanser: Soap and Water 1 x Per Day/30 Days Discharge Instructions: Gently cleanse wound with antibacterial soap, rinse and pat dry prior to dressing wounds Topical: keystone gel 1 x Per Day/30 Days Primary Dressing: Aquacel Extra Hydrofiber Dressing, 4x5  (in/in) (Generic) 1 x Per Day/30 Days Discharge Instructions: packed into wound Secondary Dressing: ABD Pad 5x9 (in/in) (Generic) 1 x Per Day/30 Days Discharge Instructions: Cover with ABD pad Secured With: Medipore Tape - 28M Medipore H Soft Cloth Surgical Tape, 2x2 (in/yd) (Generic) 1 x Per Day/30 Days Secured With: Kerlix Roll Sterile or Non-Sterile 6-ply 4.5x4 (yd/yd) (Generic) 1 x Per Day/30 Days Discharge Instructions: Apply Kerlix as directed WOUND #12: - Foot Wound Laterality: Plantar, Left, Medial Cleanser: Soap and Water 1 x Per Day/30 Days Discharge Instructions: Gently cleanse wound with antibacterial soap, rinse and pat dry prior to dressing wounds Topical: keystone gel 1 x Per Day/30 Days Discharge Instructions: apply very thin layer to wound Primary Dressing: Aquacel Extra Hydrofiber Dressing, 4x5 (in/in) (Generic) 1 x Per Day/30 Days Discharge Instructions: placed over keystone gel Secondary Dressing: ABD Pad 5x9 (in/in) (Generic) 1 x Per Day/30 Days Discharge Instructions: Cover with ABD pad Secured With: Medipore Tape - 28M Medipore H Soft Cloth Surgical Tape, 2x2 (in/yd) (Generic) 1 x Per Day/30 Days Secured With: Kerlix Roll Sterile or Non-Sterile 6-ply 4.5x4 (yd/yd) (Generic) 1 x Per Day/30 Days Discharge Instructions: Apply Kerlix as directed 1. In office sharp debridement 2. Keystone and calcium alginate to the wound beds 3. Total contact cast to the left lower extremity 4. Aggressive offloading 5. Follow-up in 1 week Electronic Signature(s) Signed: 06/08/2022 1:41:56 PM By: Kalman Shan DO Entered By: Kalman Shan on 06/08/2022 13:07:07 [name] ([id number]) -------------------------------------------------------------------------------- ROS/PFSH Details Patient Name: Heather Dear A. Date of Service: 06/08/2022 11:15 AM Medical Record Number: [id number] Patient Account Number: [id number]  Date of Birth/Sex: 03/08/1976 (46 y.o. F) Treating RN:  Cornell Barman Primary Care Provider: Raelene Bott Other Clinician: Massie Kluver Referring Provider: Raelene Bott Treating Provider/Extender: Yaakov Guthrie in Treatment: 12 Information Obtained From Patient Ear/Nose/Mouth/Throat Medical History: Positive for: Chronic sinus problems/congestion; Middle ear problems Hematologic/Lymphatic Medical History: Positive for: Anemia Respiratory Medical History: Positive for: Chronic Obstructive Pulmonary Disease (COPD) Cardiovascular Medical History: Positive for: Congestive Heart Failure - EF 33% Endocrine Medical History: Positive for: Type II Diabetes Time with diabetes: 14 years Treated with: Insulin, Oral agents Blood sugar tested every day: No Blood sugar testing results: Bedtime: 176 Genitourinary Medical History: Positive for: End Stage Renal Disease Integumentary (Skin) Medical History: Positive for: History of pressure wounds Neurologic Medical History: Positive for: Neuropathy - feel and legs HBO Extended History Items Ear/Nose/Mouth/Throat: Ear/Nose/Mouth/Throat: Chronic sinus problems/congestion Middle ear problems Immunizations Pneumococcal Vaccine: Received Pneumococcal Vaccination: No [name], [name] ([id number]) Implantable Devices No devices added Family and Social History Current every day smoker; Marital Status - Single; Alcohol Use: Never; Drug Use: Prior History; Caffeine Use: Daily Electronic Signature(s) Signed: 06/08/2022 1:41:56 PM By: Kalman Shan DO Signed: 06/09/2022 7:54:04 AM By: Gretta Cool, BSN, RN, CWS, [name] RN, BSN Entered By: Kalman Shan on 06/08/2022 13:07:43 [name] ([id number]) -------------------------------------------------------------------------------- Total Contact Cast Details Patient Name: Heather Dear A. Date of Service: 06/08/2022 11:15 AM Medical Record Number: [id number] Patient Account Number: [id number] Date of Birth/Sex: December 24, 1975 (46 y.o.  F) Treating RN: Cornell Barman Primary Care Provider: Raelene Bott Other Clinician: Massie Kluver Referring Provider: Raelene Bott Treating Provider/Extender: Yaakov Guthrie in Treatment: 12 Total Contact Cast Applied for Wound Assessment: Wound #12 Left,Medial,Plantar Foot Performed By: Physician Kalman Shan, MD Post Procedure Diagnosis Same as Pre-procedure Electronic Signature(s) Signed: 06/08/2022 1:41:56 PM By: Kalman Shan DO Signed: 06/08/2022 1:47:09 PM By: Massie Kluver Entered By: Massie Kluver on 06/08/2022 12:54:37 Petter, Steffany A. ([id number]) -------------------------------------------------------------------------------- SuperBill Details Patient Name: Heather Dear A. Date of Service: 06/08/2022 Medical Record Number: [id number] Patient Account Number: [id number] Date of Birth/Sex: 12-Sep-1976 (46 y.o. F) Treating RN: Cornell Barman Primary Care Provider: Raelene Bott Other Clinician: Massie Kluver Referring Provider: Raelene Bott Treating Provider/Extender: Yaakov Guthrie in Treatment: 12 Diagnosis Coding ICD-10 Codes Code Description [phone] 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ity 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.621 Type 2 diabetes mellitus with foot ulcer E11.42 Type 2 diabetes mellitus with diabetic polyneuropathy L97.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 of skin Facility Procedures CPT4 Code: [id number] Description: 40347 - DEB SUBQ TISSUE 20 SQ CM/< Modifier: Quantity: 1 CPT4 Code: Description: ICD-10 Diagnosis Description 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 L97.512 Non-pressure chronic ulcer of other part of right foot with fat layer expo Modifier: severity sed Quantity: Physician Procedures CPT4 Code: 4259563 Description: 11042 - WC  PHYS SUBQ TISS 20 SQ CM Modifier: Quantity: 1 CPT4 Code: Description: ICD-10 Diagnosis Description 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 L97.512 Non-pressure chronic ulcer of other part of right foot with fat layer expo Modifier: severity sed Quantity: Electronic Signature(s) Signed: 06/08/2022 1:41:56 PM By: Kalman Shan DO Entered By: Kalman Shan on 06/08/2022 13:07:22

## 2022-06-14 ENCOUNTER — Ambulatory Visit: Payer: Medicaid Other | Admitting: Internal Medicine

## 2022-06-15 ENCOUNTER — Encounter (HOSPITAL_BASED_OUTPATIENT_CLINIC_OR_DEPARTMENT_OTHER): Payer: Medicaid Other | Admitting: Internal Medicine

## 2022-06-15 DIAGNOSIS — L97512 Non-pressure chronic ulcer of other part of right foot with fat layer exposed: Secondary | ICD-10-CM

## 2022-06-15 DIAGNOSIS — E11621 Type 2 diabetes mellitus with foot ulcer: Secondary | ICD-10-CM | POA: Diagnosis not present

## 2022-06-15 DIAGNOSIS — L97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ity: Secondary | ICD-10-CM | POA: Diagnosis not present

## 2022-06-16 NOTE — Progress Notes (Signed)
[name] ([id number]) Visit Report for 06/15/2022 Chief Complaint Document Details Patient Name: [name], [name] Date of Service: 06/15/2022 3:00 PM Medical Record Number: [id number] Patient Account Number: [id number] Date of Birth/Sex: 01/30/1976 (46 y.o. F) Treating RN: Carlene Coria Primary Care Provider: Raelene Bott Other Clinician: Massie Kluver Referring Provider: Raelene Bott Treating Provider/Extender: Yaakov Guthrie in Treatment: 13 Information Obtained from: Patient Chief Complaint 03/19/2021; patient referred by Dr. Luana Shu who has been looking after her left foot for quite [name] period of time for review of [name] nonhealing area in the left midfoot 03/12/2022; bilateral feet wounds and right lower extremity wound. Electronic Signature(s) Signed: 06/15/2022 4:16:48 PM By: Kalman Shan DO Entered By: Kalman Shan on 06/15/2022 16:03:44 [name] ([id number]) -------------------------------------------------------------------------------- Debridement Details Patient Name: [name] Date of Service: 06/15/2022 3:00 PM Medical Record Number: [id number] Patient Account Number: [id number] Date of Birth/Sex: 1976-01-18 (46 y.o. F) Treating RN: Carlene Coria Primary Care Provider: Raelene Bott Other Clinician: Massie Kluver Referring Provider: Raelene Bott Treating Provider/Extender: Yaakov Guthrie in Treatment: 13 Debridement Performed for Wound #12 Left,Medial,Plantar Foot Assessment: Performed By: Physician Kalman Shan, MD Debridement Type: Debridement Severity of Tissue Pre Debridement: Fat layer exposed Level of Consciousness (Pre- Awake and Alert procedure): Pre-procedure Verification/Time Out Yes - 15:41 Taken: Start Time: 15:41 Total Area Debrided (L x W): 7.5 (cm) x 5.5 (cm) = 41.25 (cm) Tissue and other material Viable, Non-Viable, Slough, Subcutaneous, Slough debrided: Level: Skin/Subcutaneous  Tissue Debridement Description: Excisional Instrument: Curette Bleeding: Minimum Hemostasis Achieved: Pressure End Time: 15:45 Response to Treatment: Procedure was tolerated well Level of Consciousness (Post- Awake and Alert procedure): Post Debridement Measurements of Total Wound Length: (cm) 7.5 Width: (cm) 5.5 Depth: (cm) 0.2 Volume: (cm) 6.48 Character of Wound/Ulcer Post Debridement: Stable Severity of Tissue Post Debridement: Fat layer exposed Post Procedure Diagnosis Same as Pre-procedure Electronic Signature(s) Signed: 06/15/2022 4:16:48 PM By: Kalman Shan DO Signed: 06/16/2022 4:19:23 PM By: Carlene Coria RN Signed: 06/16/2022 4:37:38 PM By: Massie Kluver Entered By: Massie Kluver on 06/15/2022 15:46:29 Brumett, Arleene [name]. ([id number]) -------------------------------------------------------------------------------- Debridement Details Patient Name: [name] Date of Service: 06/15/2022 3:00 PM Medical Record Number: [id number] Patient Account Number: [id number] Date of Birth/Sex: 12-08-1975 (46 y.o. F) Treating RN: Carlene Coria Primary Care Provider: Raelene Bott Other Clinician: Massie Kluver Referring Provider: Raelene Bott Treating Provider/Extender: Yaakov Guthrie in Treatment: 13 Debridement Performed for Wound #11 Right,Plantar Foot Assessment: Performed By: Physician Kalman Shan, MD Debridement Type: Debridement Level of Consciousness (Pre- Awake and Alert procedure): Pre-procedure Verification/Time Out Yes - 15:46 Taken: Start Time: 15:46 Total Area Debrided (L x W): 0.7 (cm) x 0.4 (cm) = 0.28 (cm) Tissue and other material Viable, Non-Viable, Callus, Slough, Subcutaneous, Slough debrided: Level: Skin/Subcutaneous Tissue Debridement Description: Excisional Instrument: Curette Bleeding: Minimum Hemostasis Achieved: Pressure End Time: 15:49 Response to Treatment: Procedure was tolerated well Level of Consciousness  (Post- Awake and Alert procedure): Post Debridement Measurements of Total Wound Length: (cm) 0.7 Width: (cm) 0.4 Depth: (cm) 0.5 Volume: (cm) 0.11 Character of Wound/Ulcer Post Debridement: Stable Post Procedure Diagnosis Same as Pre-procedure Electronic Signature(s) Signed: 06/15/2022 4:16:48 PM By: Kalman Shan DO Signed: 06/16/2022 4:19:23 PM By: Carlene Coria RN Signed: 06/16/2022 4:37:38 PM By: Massie Kluver Entered By: Massie Kluver on 06/15/2022 15:49:42 [place], [place] ([id number]) -------------------------------------------------------------------------------- HPI Details Patient Name: [name] Date of Service: 06/15/2022 3:00 PM Medical Record Number: [id number] Patient Account Number: [id number] Date of Birth/Sex: 1975-10-31 (46 y.o. F) Treating RN: Epps,  Morey Hummingbird Primary Care Provider: Raelene Bott Other Clinician: Massie Kluver Referring Provider: Raelene Bott Treating Provider/Extender: Yaakov Guthrie in Treatment: 13 History of Present Illness HPI Description: 01/18/18-She is here for initial evaluation of the left great toe ulcer. She is [name] poor historian in regards to timeframe in detail. She states approximately 4 weeks ago she lacerated her toe on something in the house. She followed up with her primary care who placed her on Bactrim and ultimately [name] second dose of Bactrim prior to coming to wound clinic. She states she has been treating the toe with peroxide, Betadine and [name] Band-Aid. She did not check her blood sugar this morning but checked it yesterday morning it was 327; she is unaware of [name] recent A1c and there are no current records. She saw Dr. she would've orthopedics last week for an old injury to the left ankle, she states he did not see her toe, nor did she bring it to his attention. She smokes approximately 1 pack cigarettes [name] day. Her social situation is concerning, she arrives this morning with her mother who appears extremely  intoxicated/under the influence; her mother was asked to leave the room and be monitored by the patient's grandmother. The patient's aunt then accompanied the patient and the room throughout the rest of the appointment. We had [name] lengthy discussion regarding the deleterious effects of uncontrolled hyperglycemia and smoking as it relates to wound healing and overall health. She was strongly encouraged to decrease her smoking and get her diabetes under better control. She states she is currently on [name] diet and has cut down her [facility] consumption. The left toe is erythematous, macerated and slightly edematous with malodor present. The edema in her left foot is below her baseline, there is no erythema streaking. We will treat her with Santyl, doxycycline; we have ordered and xray, culture and provided [name] Peg assist surgical shoe and cultured the wound. 01/25/18-She is here in follow-up evaluation for [name] left great toe ulcer and presents with an abscess to her suprapubic area. She states her blood sugars remain elevated, feeling "sick" and if levels are below 250, but she is trying. She has made no attempt to decrease her smoking stating that we "can't take away her food in her cigarettes". She has been compliant with offloading using the PEG assist you. She is using Santyl daily. the culture obtained last week grew staph aureus and Enterococcus faecalis; continues on the doxycycline and Augmentin was added on Monday. The suprapubic area has erythema, no femoral variation, purple discoloration, minimal induration, was accessed with [name] cotton tip applicator with sanguinopurulent drainage, this was cultured, I suspect the current antibiotic treatment will cover and we will not add anything to her current treatment plan. She was advised to go to urgent care or ER with any change in redness, induration or fever. 02/01/18-She is here in follow-up evaluation for left great toe ulcers and [name] new abdominal abscess  from last week. She was able to use packing until earlier this week, where she "forgot it was there". She states she was feeling ill with GI symptoms last week and was not taking her antibiotic. She states her glucose levels have been predominantly less than 200, with occasional levels between 200-250. She thinks this was contributing to her GI symptoms as they have resolved without intervention. There continues to be significant laceration to left toe, otherwise it clinically looks stable/improved. There is now less superficial opening to the lateral aspect of the great  toe that was residual blister. We will transition to [facility] to all wounds, she will continue her Augmentin. If there is no change or deterioration next week for reculture. 02/08/18-She is here in follow-up evaluation for left great toe ulcer and abdominal ulcer. There is an improvement in both wounds. She has been wrapping her left toe with coban, not by our direction, which has created an area of discoloration to the medial aspect; she has been advised to NOT use coban secondary to her neuropathy. She states her glucose levels have been high over this last week ranging from 200-350, she continues to smoke. She admits to being less compliant with her offloading shoe. We will continue with same treatment plan and she will follow-up next week. 02/15/18-She is here in follow-up evaluation for left great toe ulcer and abdominal ulcer. The abdominal ulcer is epithelialized. The left great toe ulcer is improved and all injury from last week using the Coban wrap is resolved, the lateral ulcer is healed. She admits to noncompliance with wearing offloading shoe and admits to glucose levels being greater than 300 most of the week. She continues to smoke and expresses no desire to quit. There is one area medially that probes deeper than it has historically, erythema to the toe and dorsal foot has consistently waxed and waned. There is no  overt signs of cellulitis or infection but we will culture the wound for any occult infection given the new area of depth and erythema. We will hold off on sensitivities for initiation of antibiotic therapy. 02/22/18-She is here in follow up evaluation for left great toe ulcer. There is overall significant improvement in both wound appearance, erythema and edema with changes made last week. She was not initiated on antibiotic therapy. Culture obtained last week showed oxacillin sensitive staph aureus, sensitive to clindamycin. Clindamycin has been called into the pharmacy but she has been instructed to hold off on initiation secondary to overall clinical improvement and her history of antibiotic intolerance. She has been instructed to contact the clinic with any noted changes/deterioration and the wound, erythema, edema and/or pain. She will follow-up next week. She continues to smoke and her glucose levels remain elevated >250; she admits to compliance with offloading shoe 03/01/18 on evaluation today patient appears to be doing fairly well in regard to her left first toe ulcer. She has been tolerating the dressing changes with the [facility] Dressing without complication and overall this has definitely showed signs of improvement according to records as well is what the patient tells me today. I'm very pleased in that regard. She is having no pain today 03/08/18 She is here for follow up evaluation of [name] left great toe ulcer. She remains non-compliant with glucose control and smoking cessation; glucose levels consistently >200. She states that she got new shoe inserts/peg assist. She admits to compliance with offloading. Since my last evaluation there is significant improvement. We will switch to prisma at this time and she will follow up next week. She is noted to be tachycardic at this appointment, heart rate 120s; she has [name] history of heart rate 70-130 according to our records. She admits to extreme  agitation r/t personal issues; she was advised to monitor her heartrate and contact her physician if it does not return to [name] more normal range (<100). She takes cardizem twice daily. 03/15/18-She is here in follow-up evaluation for left great toe ulcer. She remains noncompliant with glucose control and smoking cessation. She admits to compliance with  wearing offloading shoe. The ulcer is improved/stable and we will continue with the same treatment plan and she will follow-up next week 03/22/18-She is here for evaluation for left great toe ulcer. There continues to be significant improvement despite recurrent hyperglycemia (over 500 yesterday) and she continues to smoke. She has been compliant with offloading and we will continue with same treatment plan and she will follow-up next week. 03/29/18-She is here for evaluation for left great toe ulcer. Despite continuing to smoke and uncontrolled diabetes she continues to improve. She is compliant with offloading shoe. We will continue with the same treatment plan and she will follow-up next week 04/05/18- She is here in follow up evaluation for [name] left great toe ulcer; she presents with small pustule to left fifth toe (resembles ant bite). She admits to compliance with wearing offloading shoe; continues to smoke or have uncontrolled blood glucose control. There is more callus than usual with evidence of bleeding; she denies known trauma. 04/12/18-She is here for evaluation of left great toe ulcer. Despite noncompliance with glycemic control and smoking she continues to make [place], Letica [name]. ([id number]) improvement. She continues to wear offloading shoe. The pustule, that was identified last week, to the left fifth toe is resolved. She will follow-up in 2 weeks 05/03/18-she is seen in follow-up evaluation for [name] left great toe ulcer. She is compliant with offloading, otherwise noncompliant with glycemic control and smoking. She has plateaued and there is  minimal improvement noted. We will transition to [facility], replaced the insert to her surgical shoe and she will follow-up in one week 05/10/18- She is here in follow up evaluation for [name] left great toe ulcer. It appears stable despite measurement change. We will continue with same treatment plan and follow up next week. 05/24/18-She is seen in follow-up evaluation for [name] left great toe ulcer. She remains compliant with offloading, has made significant improvement in her diet, decreasing the amount of sugar/soda. She said her recent A1c was 10.9 which is lower than. She did see [name] diabetic nutritionist/educator yesterday. She continues to smoke. We will continue with the same treatment plan and she'll follow-up next week. 05/31/18- She is seen in follow-up evaluation for left great toe ulcer. She continues to remain compliant with offloading, continues to make improvement in her diet, increasing her water and decreasing the amount of sugar/soda. She does continue to smoke with no desire to quit. We will apply Prisma to the depth and Hydrofera Blue over. We have not received insurance authorization for oasis. She will follow up next week. 06/07/18-She is seen in follow-up evaluation for left great toe ulcer. It has stalled according to today's measurements although base appears stable. She says she saw [name] diabetic educator yesterday; her average blood sugars are less than 300 which is an improvement for her. She continues to smoke and states "that's my next step" She continues with water over soda. We will order for xray, culture and reinstate ace wrap compression prior to placing apligraf for next week. She is voicing no complaints or concerns. Her dressing will change to iodoflex over the next week in preparation for apligraf. 06/14/18-She is seen in follow-up evaluation for left great toe ulcer. Plain film x-ray performed last week was negative for osteomyelitis. Wound culture obtained last week grew  strep B and OSSA; she is initiated on keflex and cefdinir today; there is erythema to the toe which could be from ace wrap compression, she has [name] history of wrapping too tight and  has has been encouraged to maintain ace wraps that we place today. We will hold off on application of apligraf today, will apply next week after antibiotic therapy has been initiated. She admits today that she has resumed taking [name] shower with her foot/toe submerged in water, she has been reminded to keep foot/toe out of the bath water. She will be seen in follow up next week 06/21/18-she is seen in follow-up evaluation for left great toe ulcer. She is tolerating antibiotic therapy with no GI disturbance. The wound is stable. Apligraf was applied today. She has been decreasing her smoking, only had 4 cigarettes yesterday and 1 today. She continues being more compliant in diabetic diet. She will follow-up next week for evaluation of site, if stable will remove at 2 weeks. 06/28/18- She is here in follow up evalution. Apligraf was placed last week, she states the dressing fell off on Tuesday and she was dressing with hydrofera blue. She is healed and will be discharged from the clinic today. She has been instructed to continue with smoking cessation, continue monitoring glucose levels, offloading for an additional 4 weeks and continue with hydrofera blue for additional two weeks for any possible microscopic opening. Readmission: 08/07/18 on evaluation today patient presents for reevaluation concerning the ulcer of her right great toe. She was previously discharged on 06/28/18 healed. Nonetheless she states that this began to show signs of drainage she subsequently went to her primary care provider. Subsequently an x-ray was performed on 08/01/18 which was negative. The patient was also placed on antibiotics at that time. Fortunately they should have been effective for the infection. Nonetheless she's been experiencing some  improvement but still has [name] lot of drainage coming from the wound itself. 08/14/18 on evaluation today patient's wound actually does show signs of improvement in regard to the erythema at this point. She has completed the antibiotics. With that being said we did discuss the possibility of placing her in [name] total contact cast as of today although I think that I may want to give this just [name] little bit more time to ensure nothing recurrence as far as her infection is concerned. I do not want to put in the cast and risk infection at that time if things are not completely resolved. With that being said she is gonna require some debridement today. 08/21/18 on evaluation today patient actually appears to be doing okay in regard to her toe ulcer. She's been tolerating the dressing changes without complication. With that being said it does appear that she is ready and in fact I think it's appropriate for [place] to go ahead and initiate the total contact cast today. Nonetheless she will require some sharp debridement to prepare the wound for application. Overall I feel like things have been progressing well but we do need to do something to get this to close more readily. 08/24/18 patient seen today for reevaluation after having had the total contact cast applied on Tuesday. She seems to have done very well the wound appears to be doing great and overall I'm pleased with the progress that she's made. There were no abnormal areas of rubbing from the cast on her lower extremity. 08/30/18 on evaluation today patient actually appears to be completely healed in regard to her plantar toe ulcer. She tells me at this point she's been having [name] lot of issues with the cast. She almost fell [name] couple of times the state shall the step of her dog [name] couple times as well.  This is been [name] very frustrating process for her other nonetheless she has completely healed the wound which is excellent news. Overall there does not appear to be  the evidence of infection at this time which is great news. 09/11/18 evaluation today patient presents for follow-up concerning her great toe ulcer on the left which has unfortunately reopened since I last saw her which was only [name] couple of weeks ago. Unfortunately she was not able to get in to get the shoe and potentially the AFO that's gonna be necessary due to her left foot drop. She continues with offloading shoe but this is not enough to prevent her from reopening it appears. When we last had her in the total contact cast she did well from [name] healing standpoint but unfortunately the wound reopened as soon as she came out of the cast within just [name] couple of weeks. 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 This is also part of the reason why she falls on [name] regular basis. I just do not believe that is gonna be safe for [place] to reinitiate the total contact cast as last time we had this on she fell 3 times one week which is definitely not normal for her. 09/18/18 upon evaluation today the patient actually appears to be doing about the same in regard to her toe ulcer. She did not contact [facility] as I asked her to even though I had given her the prescription. In fact she actually states that she has no idea where the prescription is. She did apparently call [facility] and they told her that all she needed to do was bring the prescription in order to be able to be seen and work on getting the AFO for her left foot. With all that being said she still does not have an appointment and I'm not sure were things stand that regard. I will give her [name] new prescription today in order to contact them to get this set up. 09/25/18 on evaluation today patient actually appears to be doing about the same in regard to her toes  ulcer. She does have [name] small areas which seems to have [name] lot of callous buildup around the edge of the wound which is going to need sharp debridement today. She still is waiting to be scheduled for evaluation with [facility] for possibility of an AFO. She states there supposed to call her tomorrow to get this set up. Unfortunately it does appear that her foot specifically the toe area is showing signs of erythema. There does not appear to be any systemic infection which is in these good news. MALLOREE, RABOIN [name]. ([id number]) 10/02/18 on evaluation today patient actually appears to be doing about the same in regard to her toe ulcer. This really has not done too well although it's not significantly larger it's also not significantly smaller. She has been tolerating the dressing changes without complication. She actually has her appointment with [facility] and [facility] tomorrow to hopefully be measured for obtaining and AFO splint. I think this would be helpful preventing this from reoccurring. We had contemplated starting the cast this week although to be honest I am reluctant to do that as she's been having nausea, vomiting, and seizure activity over the past three days. She has [name]  history of seizures and have been told is nothing that can be done for these. With that being said I do believe that along with the seizures have the nausea vomiting which upon further questioning doesn't seem to be the normal for her and makes me concerned for the possibility of infection or something else going on. I discussed this with the patient and her mother during the office visit today. I do not feel the wound is effective but maybe something else. The responses this was "this just happens to her at times and we don't know why". They did not seem to be interested in going to the hospital to have this checked out further. 10/09/18 on evaluation today patient presents for follow-up concerning her ongoing toe ulcer. She has  been tolerating the dressing changes without complication. Fortunately there does not appear to be any evidence of infection which is great news however I do think that the patient would benefit from going ahead for with the total contact cast. She's actually in [name] wheelchair today she tells me that she will use her walker if we initiate the cast. I was very specific about the fact that if we were gonna do the cast I wanted to make sure that she was using the walker in order to prevent any falls. She tells me she does not have stairs that she has to traverse on [name] regular basis at her home. She has not had any seizures since last week again that something that happens to her often she tells me she did talk to [employer] and they said that it may take up to three weeks to get the brace approved for her. Hopefully that will not take that long but nonetheless in the meantime I do think the cast could be of benefit. 10/12/18 on evaluation today patient appears to be doing rather well in regard to her toe ulcer. It's just been [name] few days and already this is significantly improved both as far as overall appearance and size. Fortunately there's no sign of infection. She is here for her first obligatory cast change. 10/19/18 Seen today for follow up and management of left great toe ulcer. Wound continues to show improvement. Noted small open area with seroussang drainage with palpation. Denies any increased pain or recent fevers during visit. She will continue calcium alginate with offloading shoe. Denies any questions or concerns during visit. 10/26/18 on evaluation today patient appears to be doing about the same as when I last saw her in regard to her wound bed. Fortunately there does not appear to be any signs of infection. Unfortunately she continues to have [name] breakdown in regard to the toe region any time that she is not in the cast. It takes almost no time at all for this to happen. Nonetheless she still has  not heard anything from the brace being made by [employer] as to when exactly this will be available to her. Fortunately there is no signs of infection at this time. 10/30/18 on evaluation today patient presents for application of the total contact cast as we just received him this morning. Fortunately we are gonna be able to apply this to her today which is great news. She continues to have no significant pain which is good news. Overall I do feel like things have been improving while she was the cast is when she doesn't have [name] cast that things get worse. She still has not really heard anything from [facility] regarding her brace. 11/02/18  upon evaluation today patient's wound already appears to be doing significantly better which is good news. Fortunately there does not appear to be any signs of infection also good news. Overall I do think the total contact cast as before is helping to heal this area unfortunately it's just not gonna likely keep the area closed and healed without her getting her brace at least. Again the foot drop is [name] significant issue for her. 11/09/18 on evaluation today patient appears to be doing excellent in regard to her toe ulcer which in fact is completely healed. Fortunately we finally got the situation squared away with the paperwork which was needed to proceed with getting her brace approved by Medicaid. I have filled that out unfortunately that information has been sent to the orthopedic office that I worked at 2 1/2 years ago and not tired Current wound care measures. Fortunately she seems to be doing very well at this time. 11/23/18 on evaluation today patient appears to be doing More Poorly Compared to Last Time I Saw Her. At [facility] She Had Completely Healed. Currently she is continuing to have issues with reopening. She states that she just found out that the brace was approved through Medicaid now she just has to go get measured in order to have this fitted for her and  then made. Subsequently she does not have an appointment for this yet that is going to complicate things we obviously cannot put her back in the cast if we do not have everything measured because they're not gonna be able to measure her foot while she is in the cast. 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 Obviously this is unfortunate and does have me somewhat worried as well. 11/30/18 on evaluation today patient's toe ulcer actually appears to be doing fairly well. The good news is she will be getting her brace in the shoes next week on Wednesday. Hopefully we will be able to get this to heal without having to go back in the cast however she may need the cast in order to get the wound completely heal and then go from there. Fortunately there's no signs of infection at this time. 12/07/18 on evaluation today patient fortunately did receive her brace and she states she could tell this definitely makes her walk better. With that being said she's been having issues with her toe where she noticed yesterday there was [name] lot of tissue that was loosing off this appears to be much larger than what it was previous. She also states that her leg has been read putting much across the top of her foot just about the ankle although this seems to be receiving somewhat. The total area is still red and appears to be someone infected as best I can tell. She is previously taken Bactrim and that may be [name] good option for her today as well. We are gonna see what I wound culture shows as well and I think that this is definitely appropriate. With that being said outside of the culture I still need to initiate something in the interim and that's what I'm gonna go ahead and select Bactrim is [name] good option for her. 12/14/18 on evaluation today patient appears to be doing better in regard to her left great toe ulcer as compared to  last week's evaluation. There's still some erythema although this is significantly improved which  is excellent news. Overall I do believe that she is making good progress is still gonna take some time before she is where I would like her to be from the standpoint of being able to place her back into the total contact cast. Hopefully we will be where we need to be by next week. 12/21/18 on evaluation today patient actually appears to be doing poorly in regard to her toe ulcer. She's been tolerating the dressing changes without complication. Fortunately there's no signs of systemic infection although she does have [name] lot of drainage from the toe ulcer and this does seem to be causing some issues at this point. She does have erythema on the distal portion of her toe that appears to be likely cellulitis. 12/28/18 on evaluation today patient actually appears to be doing [name] little better in my pinion in regard to her toe ulcer. With that being said she still does have some evidence of infection at this time and for her culture she had both E. coli as well as enterococcus as organisms noted on evaluation. For that reason I think that though the Keflex likely has treated the E. coli rather well this has really done nothing for the enterococcus. We are going to have to initiate treatment for this specifically. JANEAN, EISCHEN [name]. ([id number]) 01/04/19 on evaluation today patient's toe actually appears to be doing better from the standpoint of infection. She currently would like to see about putting the cash back on I think that this is appropriate as long as she takes care of it and keeps it from getting wet. She is gonna have some drainage we can definitely pass this up with Drawtex and alginate to try to prevent as much drainage as possible from causing the problems. With that being said I do want to at least try her with the cast between now and Tuesday. If there any issues we can't continue to use it then I will  discontinue the use of the cast at that point. 01/08/19 on evaluation today patient actually appears to be doing very well as far as her foot ulcer specifically the great toe on the left is concerned. She did have an area of rubbing on the medial aspect of her left ankle which again is from the cast. Fortunately there's no signs of infection at this point in this appears to be [name] very slight skin breakdown. The patient tells me she felt it rubbing but didn't think it was that bad. Fortunately there is no signs of active infection at this time which is good news. No fevers, chills, nausea, or vomiting noted at this time. 01/15/19 on evaluation today patient actually appears to be doing well in regard to her toe ulcer. Again as previous she seems to do well and she has the cast on which indicates to me that during the time she doesn't have [name] cast on she's putting way too much pressure on this region. Obviously I think that's gonna be an issue as with the current national emergency concerning the Covid-19 Virus it has been recommended that we discontinue the use of total contact casting by the chief medical officer of our company, Dr. Simona Huh. The reasoning is that if [name] patient becomes sick and cannot come into have the cast removed they could not just leave this on for an additional two weeks. Obviously the hospitals also do not want to receive patient's who are sick into the emergency department to potentially contaminate the region and spread the  Covid-19 Virus among other sick individuals within the hospital system. Therefore at this point we are suspending the use of total contact cast until the current emergency subsides. This was all discussed with the patient today as well. 01/22/19 on evaluation today patient's wound on her left great toe appears to be doing slightly worse than previously noted last week. She tells me that she has been on this quite [name] bit in fact she tells me she's been awake for 38  straight hours. This is due to the fact that she's having to care for grandparents because nobody else will. She has been taking care of them for five the last seven days since I've seen her they both have dementia his is from [name] stroke and her grandmother's was progressive. Nonetheless she states even her mom who knows her condition and situation has only help two of those days to take care of them she's been taking care of the rest. Fortunately there does not appear to be any signs of active infection in regard to her toe at this point although obviously it doesn't look as good as it did previous. I think this is directly related to her not taking off the pressure and friction by way of taking things easy. Though I completely understand what's going on. 01/29/19 on evaluation today patient's tools are actually appears to be showing some signs of improvement today compared to last week's evaluation as far as not necessarily the overall size of the wound but the fact that she has some new skin growth in between the two ends of the wound opening. Overall I feel like she has done well she states that she had [name] family member give her what sounds to be [name] CAM walker boot which has been helpful as well. 02/05/19 on evaluation today patient's wound bed actually appears to be doing significantly better in regard to her overall appearance of the size of the wound. With that being said she is still having an issue with offloading efficiently enough to get this to close. Apparently there is some signs of infection at this point as well unfortunately. Previously she's done well of Augmentin I really do not see anything that needs to be culture currently but there theme and cellulitis of the foot that I'm seeing I'm gonna go ahead and place her on an antibiotic today to try to help clear this up. 02/12/2019 on evaluation today patient actually appears to be doing poorly in regard to her overall wound status. She tells  me she has been using her offloading shoe but actually comes in today wearing her tennis shoe with the AFO brace. Again as I previously discussed with her this is really not sufficient to allow the area to heal appropriately. Nonetheless she continues to be somewhat noncompliant and I do wonder based on what she has told my nurse in the past as to whether or not [name] good portion of this noncompliance may be recreational drug and alcohol related. She has had [name] history of heroin overdose and this was fairly recently in the past couple of months that have been seeing her. Nonetheless overall I feel like her wound looks significantly worse today compared to what it was previous. She still has significant erythema despite the Augmentin I am not sure that this is an appropriate medication for her infection I am also concerned that the infection may have gone down into her bone. 02/19/19 on evaluation today patient actually appears to be doing about  the same in regard to her toe ulcer. Unfortunately she continues to show signs of bone exposure and infection at this point. There does not appear to be any evidence of worsening of the infection but I'm also not really sure that it's getting significantly better. She is on the Augmentin which should be sufficient for the Staphylococcus aureus infection that she has at this point. With that being said she may need IV antibiotics to more appropriately treat this. We did have [name] discussion today about hyperbaric option therapy. 02/28/19 on evaluation today patient actually appears to be doing much worse in regard to the wound on her left great toe as compared to even my previous evaluation last week. Unfortunately this seems to be training in [name] pretty poor direction. Her toe was actually now starting to angle laterally and I can actually see the entire joint area of the proximal portion of the digit where is the distal portion of the digit again is no longer even in  contact with the joint line. Unfortunately there's [name] lot more necrotic tissue around the edge and the toe appears to be showing signs of becoming gangrenous in my pinion. I'm very concerned about were things stand at this point. She did see infectious disease and they are planning to send in [name] prescription for Sivextro for her and apparently this has been approved. With that being said I don't think she should avoid taking this but at the same time I'm not sure that it's gonna be sufficient to save her toe at this point. She tells me that she still having to care for grandparents which I think is putting quite [name] bit of strain on her foot and specifically the total area and has caused this to break down even to [name] greater degree than would've otherwise been expected. 03/05/19 on evaluation today patient actually appears to be doing quite well in regard to her toe all things considering. She still has bone exposed but there appears to be much less your thing on overall the appearance of the wound and the toe itself is dramatically improved. She still does have some issues currently obviously with infection she did see vascular as well and there concerned that her blood flow to the toad. For that reason they are setting up for an angiogram next week. 03/14/19 on evaluation today patient appears to be doing very poor in regard to her toe and specifically in regard to the ulceration and the fact that she's starting to notice the toe was leaning even more towards the lateral aspect and the complete joint is visible on the proximal aspect of the joint. Nonetheless she's also noted [name] significant odor and the tip of the toe is turning more dark and necrotic appearing. Overall I think she is getting worse not better as far as this is concerned. For that reason I am recommending at this point that she likely needs to be seen for likely amputation. READMISSION 03/19/2021 This is [name] patient that we cared for in this  clinic for [name] prolonged period of time in 2019 and 2020 with [name] left foot and left first toe wound. 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 ([id number]) 04/09/20 by Dr. Luana Shu. In December 2021 she had an ulcer on her right great toe as well as the fourth and fifth toes. 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 She also had an angiogram at that time and underwent angioplasty of the right anterior tibial artery. In any case she claims that the wound on the right foot is closed I did not look at this today which was probably an oversight although I think that should be done next week. 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 According to Dr. Deborra Medina last review that she was out of the area being cared for by another physician but recently came back to his attention. 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 [name] culture of this area showed E. coli apparently before she came back to see Dr. Luana Shu she was supposed to be receiving antibiotics but she did not really take them. Nor is she offloading this area at all. Finally her last hemoglobin A1c listed in epic was in March 2022 at 14.1 she says things are [name] lot better since then although I am not sure. 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 She had this wound at the time. During this admission she had E. coli sepsis [name] TEE was negative. 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 She is still [name] heavy  cigarette smoker. She has [name] history of bipolar disorder seizure disorder ABI in our clinic was 1.05 6/1; left midfoot in the setting of [name] TMA done previously. Round circular wound with [name] "knuckle" of protruding tissue. The problem is that the knuckle was not attached to any of the surrounding granulation and this probed proximally widely I removed [name] large portion of this tissue. This wound goes with considerable undermining laterally. I do not feel any bone there was no purulence but this is [name] deep wound. 6/8; in spite of the debridement I did last week. She arrives with [name] wound looking exactly the same. [name] protruding "knuckle" of tissue nonadherent to most of the surrounding tissue. There is considerable depth around this from 6-12 o'clock at 2.7 cm and undermining of 1 cm. This does not look overtly infected and the x-ray I did last week was negative for any osseous abnormalities. We have been using silver collagen 6/15; deep tissue culture I did last week showed moderate staph aureus and moderate Pseudomonas. This will definitely require prolonged antibiotic therapy. The pathology on the protuberant area was negative for malignancy fungus etc. 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 We have been using silver collagen. I am going to be prescribing Levaquin for [address] scan of the foot is down for 7/5 6/22; CT scan of the foot on 7 5. She says she has hardware in the left leg from her previous fracture. She is on the Levaquin for the deep tissue culture I did that showed methicillin sensitive staph aureus and Pseudomonas. I gave her [name] 2-week supply and she will have another week. She arrives in clinic today with the same protuberant tissue however this is nonadherent to the tissue surrounding it. I am really at [name] loss to explain this unless there is underlying deep tissue infection 6/29; patient presents for 1 week follow-up. She  has been using collagen  to the wound bed. She reports taking her antibiotics as prescribed.She has no complaints or issues today. She denies signs of infection. 7/6; patient presents for one week followup. She has been using collagen to the wound bed. She states she is taking Levaquin however at times she is not able to keep it down. She denies signs of infection. 7/13; patient presents for 1 week follow-up. She has been using silver alginate to the wound bed. She still has nausea when taking her antibiotics. She denies signs of infection. 7/20; patient presents for 1 week follow-up. She has been using silver alginate with gentamicin cream to the wound bed. She denies any issues and has no complaints today. She denies signs of infection. 7/27; patient presents for 1 week follow-up. She continues to use silver alginate with gentamicin cream to the wound bed. She reports starting her antibiotics. She has no issues or complaints. Overall she reports stability to the wound. 8/3; patient presents for 1 week follow-up. She has been using silver alginate with gentamicin cream to the wound bed. She reports completing all antibiotics. She has no issues or complaints today. She denies signs of infection. 8/17; patient presents for 2-week follow-up. He is to use silver alginate to the wound bed. She has no issues or complaints today. She denies signs of infection. She reports her pain has improved in her foot since last clinic visit 8/24; patient presents for 1 week follow-up. She continues to use silver alginate to the wound bed. She has no issues or complaints. She denies signs of infection. Pain is stable. 9/7; patient presents for follow-up. She missed her last week appointment due to feeling ill. She continues to use silver alginate. She has [name] new wound to the right lower extremity that is covered in eschar. She states It occurred over the past week and has no idea how it started. She currently denies  signs of infection. 9/14; patient presents for follow-up. To the left foot wound she has been using gentamicin cream and silver alginate. To the right lower extremity wound she has been keeping this covered and has not obtain Santyl. 9/21; patient presents for follow-up. She reports using gentamicin cream and silver alginate to the left foot and Santyl to the right lower extremity wound. She has no issues or complaints today. She denies signs of infection. 9/28; patient presents for follow-up. She reports [name] new wound to her right heel. She states this occurred [name] few days ago and is progressively gotten worse. She has been trying to clean the area with [name] Q-tip and Santyl. She reports stability in the other 2 wounds. She has been using gentamicin cream and silver alginate to the left foot and Santyl to the right lower extremity wound. 10/12; patient presents for follow-up. She reports improvement to the wound beds. She is seeing vein and vascular to discuss the potential of [name] left BKA. She states they are going to do an arteriogram. She continues to use silver alginate with dressing changes to her wounds. 11/2; patient presents for follow-up. She states she has not been doing dressing changes to the wound beds. She states she is not able to offload the areas. She reports chronic pain to her left foot wound. 11/9; patient presents for follow-up. She came in with only socks on. She states she forgot to put on shoes. It is unclear if she is doing any dressing changes. She currently denies systemic signs of infection. 11/16; patient presents for follow-up. She came again  only with socks on. She states she does not wear shoes ever. It is unclear if she does dressing changes. She currently denies systemic signs of infection. 11/23; patient presents for follow-up. She wore her shoes today. It still unclear exactly what dressing she is using for each wound but she did states she obtained Dakin's solution and  has been using this to the left foot wound. She currently denies signs of infection. 11/30; patient presents for follow-up. She has no issues or complaints today. She currently denies signs of infection. 12/7; patient presents for follow-up. She has no issues or complaints today. She has been using Hydrofera Blue to the right heel wound and Dakin solution to the left foot wound. Her right anterior leg wound is healed. She currently denies signs of infection. WALDA, HERTZOG [name]. ([id number]) 12/14; patient presents for follow-up. She has been using Hydrofera Blue to the right heel and Dakin's to the left foot wounds. She has no issues or complaints today. She denies signs of infection. 12/21; patient presents for follow-up. She reports using Hydrofera Blue to the right heel and Dakin's to the left foot wound. She denies signs of infection. 12/28; patient presents for follow-up. She continues to use Dakin's to the left foot wound and Hydrofera Blue to the right heel wound. She denies signs of infection. 1/4; patient presents for follow-up. She has no issues or complaints today. She denies signs of infection. 1/11; patient presents for follow-up. It is unclear if she has been dressing these wounds over the past week. She currently denies signs of infection. 1/18; patient presents for follow-up. She states she has been using Dakin's wet-to-dry dressings to the left foot. She has been using Hydrofera Blue to the right foot foot wound. She states that the anterior right leg wound has reopened and draining serous fluid. She denies signs of infection. 1/25; patient presents for follow-up. She has no issues or complaints today. 2/1; patient presents for follow-up. She has no issues or complaints today. She denies signs of infection. 2/8; patient presents for follow-up. She has lost her surgical shoes. She did not have [name] dressing to the right heel wound. She currently denies signs of infection. 2/15; patient  presents for follow-up. She reports more pain to the right heel today. She denies purulent drainage Or fever/chills 2/22; patient presents for follow-up. She reports taking clindamycin over the past week. She states that she continues to have pain to her right heel. She reports purulent drainage. Readmission 03/16/2022 Ms. Kennya Schwenn is [name] 46 year old female with [name] past medical history of type 2 diabetes, osteomyelitis to her feet, chronic systolic heart failure and bipolar disorder that presents to the clinic for bilateral feet wounds and right lower extremity wound. She was last seen in our clinic on 12/15/2021. At that time she had purulent drainage coming out of her right plantar foot and I recommended she go to the ED. She states she went to [facility] and has been there for the past 3 months. I cannot see the records. She states she had OR debridement and was on several weeks of IV antibiotics while inpatient. Since discharge she has not been taking care of the wound beds. She had nothing on her feet other than socks today. She currently denies signs of infection. 5/31; patient presents for follow-up. She has been using Dakin's wet-to-dry dressings to the wound beds on her feet bilaterally and antibiotic ointment to the right anterior leg wound. She had [name] wound culture  done at last clinic visit that showed moderate Pseudomonas aeruginosa sensitive to ciprofloxacin. She currently denies systemic signs of infection. 6/14; patient presents for follow-up. She received Keystone 5 days ago and has been using this on the wound beds. She states that last week she had to go to the hospital because she had increased warmth and erythema to the right foot. She was started on 2 oral antibiotics. She states she has been taking these. She currently denies systemic signs of infection. She has no issues or complaints today. 6/21; patient presents for follow-up. She states she has been using  Keystone antibiotics to the wound beds. She has no issues or complaints today. She denies signs of infection. 6/28; patient presents for follow-up. She has been using Keystone antibiotics to the wound beds. She has no issues or complaints today. 7/12; patient presents for follow-up. Has been using Keystone antibiotics to the wound beds with calcium alginate. She has no issues or complaints today. She never followed up with her orthopedic surgeon who did the OR debridement to the right foot. We discussed the total contact cast for the left foot and patient would like to do this next week. 7/19; patient presents for follow-up. She has been using Keystone antibiotics with calcium alginate to the wound beds. She has no issues or complaints today. Patient is in agreement to do the total contact cast of the left foot today. She knows to return later this week for the obligatory cast change. 05-13-2022 upon evaluation today patient's wound which she has the cast of the left leg actually appears to be doing significantly better. Fortunately I do not see any signs of active infection locally or systemically which is great news and overall I am extremely pleased with where we stand currently. 7/26; patient presents for follow-up. She has [name] cast in place for the past week. She states it irritated her shin. Other than that she tolerated the cast well. She states she would like [name] break for 1 week from the cast. We have been using Keystone antibiotic and Aquacel to both wound beds. She denies signs of infection. 8/2; patient presents for follow-up. She has been using Keystone and Aquacel to the wound beds. She denies any issues and has no complaints. She is agreeable to have the cast placed today for the left leg. 06-03-2022 upon evaluation today patient appears to be doing well with regard to her wound she saw some signs of improvement which is great news. Fortunately I do not see any evidence of active infection  locally or systemically at this time which is great news. No fevers, chills, nausea, vomiting, or diarrhea. 8/16; patient presents for follow-up. She has no issues or complaints today. We have been using Keystone and Aquacel to the wound beds. The left lower extremity is in [name] total contact cast. She is tolerated this well. 8/23; patient presents for follow-up. She has had the total contact cast on the left leg for the past week. Unfortunately this has rubbed and broken down the skin to the medial foot. She currently denies signs of infection. She has been using Keystone antibiotic to the right plantar foot wound. LILLAH, STANDRE ([id number]) Electronic Signature(s) Signed: 06/15/2022 4:16:48 PM By: Kalman Shan DO Entered By: Kalman Shan on 06/15/2022 16:08:37 [name] ([id number]) -------------------------------------------------------------------------------- Physical Exam Details Patient Name: [name] Date of Service: 06/15/2022 3:00 PM Medical Record Number: [id number] Patient Account Number: [id number] Date of Birth/Sex: 1976-06-26 (46 y.o. F) Treating RN:  Carlene Coria Primary Care Provider: Raelene Bott Other Clinician: Massie Kluver Referring Provider: Raelene Bott Treating Provider/Extender: Yaakov Guthrie in Treatment: 13 Constitutional . Cardiovascular . Psychiatric . Notes Right foot: To the plantar heel there is an incision site with increased depth. Nonviable tissue at the surface, callus present. No probing to bone. Left foot: To the medial aspect there is an open wound with granulation tissue and nonviable tissue. Area of skin breakdown to the medial foot. Blue/green Drainage to the periwound Electronic Signature(s) Signed: 06/15/2022 4:16:48 PM By: Kalman Shan DO Entered By: Kalman Shan on 06/15/2022 16:09:37 [name]  ([id number]) -------------------------------------------------------------------------------- Physician Orders Details Patient Name: [name] Date of Service: 06/15/2022 3:00 PM Medical Record Number: [id number] Patient Account Number: [id number] Date of Birth/Sex: 05/08/1976 (46 y.o. F) Treating RN: Carlene Coria Primary Care Provider: Raelene Bott Other Clinician: Massie Kluver Referring Provider: Raelene Bott Treating Provider/Extender: Yaakov Guthrie in Treatment: 67 Verbal / Phone Orders: No Diagnosis Coding Follow-up Appointments o Return Appointment in 1 week. o Nurse Visit as needed [employer] o Wash wounds with antibacterial soap and water. o No tub bath. Anesthetic (Use 'Patient Medications' Section for Anesthetic Order Entry) o Lidocaine applied to wound bed Edema Control - Lymphedema / Segmental Compressive Device / Other o Elevate, Exercise Daily and Avoid Standing for Long Periods of Time. o Elevate legs to the level of the heart and pump ankles as often as possible o Elevate leg(s) parallel to the floor when sitting. Off-Loading o Total Contact Cast to Left Lower Extremity - No cast for this week contact cast #3 applied Don't get cast wet o Other: - keep pressure off of feet. Medications-Please add to medication list. o Keystone Compound - Keystone gel o Topical Antibiotic - AandD ointment to broken down area Wound Treatment Wound #11 - Foot Wound Laterality: Plantar, Right Cleanser: Soap and Water 1 x Per Day/30 Days Discharge Instructions: Gently cleanse wound with antibacterial soap, rinse and pat dry prior to dressing wounds Topical: keystone gel 1 x Per Day/30 Days Primary Dressing: Aquacel Extra Hydrofiber Dressing, 4x5 (in/in) (Generic) 1 x Per Day/30 Days Discharge Instructions: packed into wound Secondary Dressing: ABD Pad 5x9 (in/in) (Generic) 1 x Per Day/30 Days Discharge Instructions: Cover  with ABD pad Secured With: Medipore Tape - 55M Medipore H Soft Cloth Surgical Tape, 2x2 (in/yd) (Generic) 1 x Per Day/30 Days Secured With: Kerlix Roll Sterile or Non-Sterile 6-ply 4.5x4 (yd/yd) (Generic) 1 x Per Day/30 Days Discharge Instructions: Apply Kerlix as directed Wound #12 - Foot Wound Laterality: Plantar, Left, Medial Cleanser: Soap and Water 1 x Per Day/30 Days Discharge Instructions: Gently cleanse wound with antibacterial soap, rinse and pat dry prior to dressing wounds Topical: Vitamin AandD Ointment, 4(oz) tube 1 x Per Day/30 Days Discharge Instructions: apply to red, broken down area Topical: keystone gel 1 x Per Day/30 Days Discharge Instructions: apply very thin layer to wound Primary Dressing: Aquacel Extra Hydrofiber Dressing, 4x5 (in/in) (Generic) 1 x Per Day/30 Days Forlenza, Merrissa [name]. ([id number]) Discharge Instructions: placed over keystone gel Secondary Dressing: ABD Pad 5x9 (in/in) (Generic) 1 x Per Day/30 Days Discharge Instructions: Cover with ABD pad Secured With: Medipore Tape - 55M Medipore H Soft Cloth Surgical Tape, 2x2 (in/yd) (Generic) 1 x Per Day/30 Days Secured With: Kerlix Roll Sterile or Non-Sterile 6-ply 4.5x4 (yd/yd) (Generic) 1 x Per Day/30 Days Discharge Instructions: Apply Kerlix as directed Electronic Signature(s) Signed: 06/15/2022 4:16:48 PM By: Kalman Shan DO Entered By: Heber Mulberry,  Jameon Deller on 06/15/2022 16:16:09 LANEY, LOUDERBACK ([id number]) -------------------------------------------------------------------------------- Problem List Details Patient Name: ZAMARI, BONSALL [name]. Date of Service: 06/15/2022 3:00 PM Medical Record Number: [id number] Patient Account Number: [id number] Date of Birth/Sex: 01/03/1976 (46 y.o. 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 ([id number]) -------------------------------------------------------------------------------- Progress Note Details Patient Name: [name] Date of Service: 06/15/2022 3:00 PM Medical Record Number: [id number] Patient Account Number: [id number] Date of Birth/Sex: 1976-02-06 (46 y.o. F) Treating RN: Carlene Coria Primary Care Provider: Raelene Bott Other Clinician: Massie Kluver Referring Provider: Raelene Bott Treating Provider/Extender: Yaakov Guthrie in Treatment: 13 Subjective Chief Complaint Information obtained from Patient 03/19/2021; patient referred by Dr. Luana Shu who has been looking after her left foot for quite [name] period of time for review of [name] nonhealing area in the left midfoot 03/12/2022; bilateral feet wounds and right lower extremity wound. History of Present Illness (HPI) 01/18/18-She is here for initial evaluation of the left great toe ulcer. She is [name] poor historian in regards to timeframe in detail. She states approximately 4 weeks ago she lacerated her toe on something in the house. She followed up with her primary care who placed her on Bactrim and ultimately [name] second dose of Bactrim prior to coming to wound clinic. She states she has been  treating the toe with peroxide, Betadine and [name] Band-Aid. She did not check her blood sugar this morning but checked it yesterday morning it was 327; she is unaware of [name] recent A1c and there are no current records. She saw Dr. she would've orthopedics last week for an old injury to the left ankle, she states he did not see her toe, nor did she bring it to his attention. She smokes approximately 1 pack cigarettes [name] day. Her social situation is concerning, she arrives this morning with her mother who appears extremely intoxicated/under the influence; her mother was asked to leave the room and be monitored by the patient's grandmother. The patient's aunt then accompanied the patient and the room throughout the rest of the appointment. We had [name] lengthy discussion regarding the deleterious effects of uncontrolled hyperglycemia and smoking as it relates to wound healing and overall health. She was strongly encouraged to decrease her smoking and get her diabetes under better control. She states she is currently on [name] diet and has cut down her [facility] consumption. The left toe is erythematous, macerated and slightly edematous with malodor present. The edema in her left foot is below her baseline, there is no erythema streaking. We will treat her with Santyl, doxycycline; we have ordered and xray, culture and provided [name] Peg assist surgical shoe and cultured the wound. 01/25/18-She is here in follow-up evaluation for [name] left great toe ulcer and presents with an abscess to  her suprapubic area. She states her blood sugars remain elevated, feeling "sick" and if levels are below 250, but she is trying. She has made no attempt to decrease her smoking stating that we "can't take away her food in her cigarettes". She has been compliant with offloading using the PEG assist you. She is using Santyl daily. the culture obtained last week grew staph aureus and Enterococcus faecalis; continues on the doxycycline and  Augmentin was added on Monday. The suprapubic area has erythema, no femoral variation, purple discoloration, minimal induration, was accessed with [name] cotton tip applicator with sanguinopurulent drainage, this was cultured, I suspect the current antibiotic treatment will cover and we will not add anything to her current treatment plan. She was advised to go to urgent care or ER with any change in redness, induration or fever. 02/01/18-She is here in follow-up evaluation for left great toe ulcers and [name] new abdominal abscess from last week. She was able to use packing until earlier this week, where she "forgot it was there". She states she was feeling ill with GI symptoms last week and was not taking her antibiotic. She states her glucose levels have been predominantly less than 200, with occasional levels between 200-250. She thinks this was contributing to her GI symptoms as they have resolved without intervention. There continues to be significant laceration to left toe, otherwise it clinically looks stable/improved. There is now less superficial opening to the lateral aspect of the great toe that was residual blister. We will transition to [facility] to all wounds, she will continue her Augmentin. If there is no change or deterioration next week for reculture. 02/08/18-She is here in follow-up evaluation for left great toe ulcer and abdominal ulcer. There is an improvement in both wounds. She has been wrapping her left toe with coban, not by our direction, which has created an area of discoloration to the medial aspect; she has been advised to NOT use coban secondary to her neuropathy. She states her glucose levels have been high over this last week ranging from 200-350, she continues to smoke. She admits to being less compliant with her offloading shoe. We will continue with same treatment plan and she will follow-up next week. 02/15/18-She is here in follow-up evaluation for left great toe ulcer  and abdominal ulcer. The abdominal ulcer is epithelialized. The left great toe ulcer is improved and all injury from last week using the Coban wrap is resolved, the lateral ulcer is healed. She admits to noncompliance with wearing offloading shoe and admits to glucose levels being greater than 300 most of the week. She continues to smoke and expresses no desire to quit. There is one area medially that probes deeper than it has historically, erythema to the toe and dorsal foot has consistently waxed and waned. There is no overt signs of cellulitis or infection but we will culture the wound for any occult infection given the new area of depth and erythema. We will hold off on sensitivities for initiation of antibiotic therapy. 02/22/18-She is here in follow up evaluation for left great toe ulcer. There is overall significant improvement in both wound appearance, erythema and edema with changes made last week. She was not initiated on antibiotic therapy. Culture obtained last week showed oxacillin sensitive staph aureus, sensitive to clindamycin. Clindamycin has been called into the pharmacy but she has been instructed to hold off on initiation secondary to overall clinical improvement and her history of antibiotic intolerance. She has been instructed to contact  the clinic with any noted changes/deterioration and the wound, erythema, edema and/or pain. She will follow-up next week. She continues to smoke and her glucose levels remain elevated >250; she admits to compliance with offloading shoe 03/01/18 on evaluation today patient appears to be doing fairly well in regard to her left first toe ulcer. She has been tolerating the dressing changes with the [facility] Dressing without complication and overall this has definitely showed signs of improvement according to records as well is what the patient tells me today. I'm very pleased in that regard. She is having no pain today 03/08/18 She is here for  follow up evaluation of [name] left great toe ulcer. She remains non-compliant with glucose control and smoking cessation; glucose levels consistently >200. She states that she got new shoe inserts/peg assist. She admits to compliance with offloading. Since my last evaluation there is significant improvement. We will switch to prisma at this time and she will follow up next week. She is noted to be tachycardic at this appointment, heart rate 120s; she has [name] history of heart rate 70-130 according to our records. She admits to extreme agitation r/t personal issues; she was advised to monitor her heartrate and contact her physician if it does not return to [name] more normal range (<100). She takes cardizem twice daily. 03/15/18-She is here in follow-up evaluation for left great toe ulcer. She remains noncompliant with glucose control and smoking cessation. She admits to compliance with wearing offloading shoe. The ulcer is improved/stable and we will continue with the same treatment plan and she will follow-up next week 03/22/18-She is here for evaluation for left great toe ulcer. There continues to be significant improvement despite recurrent hyperglycemia (over 500 yesterday) and she continues to smoke. She has been compliant with offloading and we will continue with same treatment plan and she will follow-up next week. Jakob, Azaryah [name]. ([id number]) 03/29/18-She is here for evaluation for left great toe ulcer. Despite continuing to smoke and uncontrolled diabetes she continues to improve. She is compliant with offloading shoe. We will continue with the same treatment plan and she will follow-up next week 04/05/18- She is here in follow up evaluation for [name] left great toe ulcer; she presents with small pustule to left fifth toe (resembles ant bite). She admits to compliance with wearing offloading shoe; continues to smoke or have uncontrolled blood glucose control. There is more callus than usual with evidence of  bleeding; she denies known trauma. 04/12/18-She is here for evaluation of left great toe ulcer. Despite noncompliance with glycemic control and smoking she continues to make improvement. She continues to wear offloading shoe. The pustule, that was identified last week, to the left fifth toe is resolved. She will follow-up in 2 weeks 05/03/18-she is seen in follow-up evaluation for [name] left great toe ulcer. She is compliant with offloading, otherwise noncompliant with glycemic control and smoking. She has plateaued and there is minimal improvement noted. We will transition to [facility], replaced the insert to her surgical shoe and she will follow-up in one week 05/10/18- She is here in follow up evaluation for [name] left great toe ulcer. It appears stable despite measurement change. We will continue with same treatment plan and follow up next week. 05/24/18-She is seen in follow-up evaluation for [name] left great toe ulcer. She remains compliant with offloading, has made significant improvement in her diet, decreasing the amount of sugar/soda. She said her recent A1c was 10.9 which is lower than. She did see  [name] diabetic nutritionist/educator yesterday. She continues to smoke. We will continue with the same treatment plan and she'll follow-up next week. 05/31/18- She is seen in follow-up evaluation for left great toe ulcer. She continues to remain compliant with offloading, continues to make improvement in her diet, increasing her water and decreasing the amount of sugar/soda. She does continue to smoke with no desire to quit. We will apply Prisma to the depth and Hydrofera Blue over. We have not received insurance authorization for oasis. She will follow up next week. 06/07/18-She is seen in follow-up evaluation for left great toe ulcer. It has stalled according to today's measurements although base appears stable. She says she saw [name] diabetic educator yesterday; her average blood sugars are less than 300 which is  an improvement for her. She continues to smoke and states "that's my next step" She continues with water over soda. We will order for xray, culture and reinstate ace wrap compression prior to placing apligraf for next week. She is voicing no complaints or concerns. Her dressing will change to iodoflex over the next week in preparation for apligraf. 06/14/18-She is seen in follow-up evaluation for left great toe ulcer. Plain film x-ray performed last week was negative for osteomyelitis. Wound culture obtained last week grew strep B and OSSA; she is initiated on keflex and cefdinir today; there is erythema to the toe which could be from ace wrap compression, she has [name] history of wrapping too tight and has has been encouraged to maintain ace wraps that we place today. We will hold off on application of apligraf today, will apply next week after antibiotic therapy has been initiated. She admits today that she has resumed taking [name] shower with her foot/toe submerged in water, she has been reminded to keep foot/toe out of the bath water. She will be seen in follow up next week 06/21/18-she is seen in follow-up evaluation for left great toe ulcer. She is tolerating antibiotic therapy with no GI disturbance. The wound is stable. Apligraf was applied today. She has been decreasing her smoking, only had 4 cigarettes yesterday and 1 today. She continues being more compliant in diabetic diet. She will follow-up next week for evaluation of site, if stable will remove at 2 weeks. 06/28/18- She is here in follow up evalution. Apligraf was placed last week, she states the dressing fell off on Tuesday and she was dressing with hydrofera blue. She is healed and will be discharged from the clinic today. She has been instructed to continue with smoking cessation, continue monitoring glucose levels, offloading for an additional 4 weeks and continue with hydrofera blue for additional two weeks for any possible microscopic  opening. Readmission: 08/07/18 on evaluation today patient presents for reevaluation concerning the ulcer of her right great toe. She was previously discharged on 06/28/18 healed. Nonetheless she states that this began to show signs of drainage she subsequently went to her primary care provider. Subsequently an x-ray was performed on 08/01/18 which was negative. The patient was also placed on antibiotics at that time. Fortunately they should have been effective for the infection. Nonetheless she's been experiencing some improvement but still has [name] lot of drainage coming from the wound itself. 08/14/18 on evaluation today patient's wound actually does show signs of improvement in regard to the erythema at this point. She has completed the antibiotics. With that being said we did discuss the possibility of placing her in [name] total contact cast as of today although I think that I may  want to give this just [name] little bit more time to ensure nothing recurrence as far as her infection is concerned. I do not want to put in the cast and risk infection at that time if things are not completely resolved. With that being said she is gonna require some debridement today. 08/21/18 on evaluation today patient actually appears to be doing okay in regard to her toe ulcer. She's been tolerating the dressing changes without complication. With that being said it does appear that she is ready and in fact I think it's appropriate for [place] to go ahead and initiate the total contact cast today. Nonetheless she will require some sharp debridement to prepare the wound for application. Overall I feel like things have been progressing well but we do need to do something to get this to close more readily. 08/24/18 patient seen today for reevaluation after having had the total contact cast applied on Tuesday. She seems to have done very well the wound appears to be doing great and overall I'm pleased with the progress that she's made.  There were no abnormal areas of rubbing from the cast on her lower extremity. 08/30/18 on evaluation today patient actually appears to be completely healed in regard to her plantar toe ulcer. She tells me at this point she's been having [name] lot of issues with the cast. She almost fell [name] couple of times the state shall the step of her dog [name] couple times as well. This is been [name] very frustrating process for her other nonetheless she has completely healed the wound which is excellent news. Overall there does not appear to be the evidence of infection at this time which is great news. 09/11/18 evaluation today patient presents for follow-up concerning her great toe ulcer on the left which has unfortunately reopened since I last saw her which was only [name] couple of weeks ago. Unfortunately she was not able to get in to get the shoe and potentially the AFO that's gonna be necessary due to her left foot drop. She continues with offloading shoe but this is not enough to prevent her from reopening it appears. When we last had her in the total contact cast she did well from [name] healing standpoint but unfortunately the wound reopened as soon as she came out of the cast within just [name] couple of weeks. 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 This is also part of the reason why she falls on [name] regular basis. I just do not believe that is gonna be safe for [place] to reinitiate the total contact cast as last time we had this on she fell 3 times one week which is definitely not normal for her. 09/18/18 upon evaluation today the patient actually appears to be doing about the same in regard to her toe ulcer. She did not contact [facility] as I asked her to even though I had given her the prescription. In fact she actually states that she  has no idea where the prescription is. She did apparently call [facility] and they told her that all she needed to do was bring the prescription in order to be able to be seen and work on getting the AFO for her left foot. With all that being said she still does  not have an appointment and I'm not sure were things stand that regard. I will give her [name] new prescription today in order to contact them to get this set up. JLYNN, LY [name]. ([id number]) 09/25/18 on evaluation today patient actually appears to be doing about the same in regard to her toes ulcer. She does have [name] small areas which seems to have [name] lot of callous buildup around the edge of the wound which is going to need sharp debridement today. She still is waiting to be scheduled for evaluation with [facility] for possibility of an AFO. She states there supposed to call her tomorrow to get this set up. Unfortunately it does appear that her foot specifically the toe area is showing signs of erythema. There does not appear to be any systemic infection which is in these good news. 10/02/18 on evaluation today patient actually appears to be doing about the same in regard to her toe ulcer. This really has not done too well although it's not significantly larger it's also not significantly smaller. She has been tolerating the dressing changes without complication. She actually has her appointment with [facility] and [facility] tomorrow to hopefully be measured for obtaining and AFO splint. I think this would be helpful preventing this from reoccurring. We had contemplated starting the cast this week although to be honest I am reluctant to do that as she's been having nausea, vomiting, and seizure activity over the past three days. She has [name] history of seizures and have been told is nothing that can be done for these. With that being said I do believe that along with the seizures have the nausea vomiting which upon further questioning doesn't seem to be the  normal for her and makes me concerned for the possibility of infection or something else going on. I discussed this with the patient and her mother during the office visit today. I do not feel the wound is effective but maybe something else. The responses this was "this just happens to her at times and we don't know why". They did not seem to be interested in going to the hospital to have this checked out further. 10/09/18 on evaluation today patient presents for follow-up concerning her ongoing toe ulcer. She has been tolerating the dressing changes without complication. Fortunately there does not appear to be any evidence of infection which is great news however I do think that the patient would benefit from going ahead for with the total contact cast. She's actually in [name] wheelchair today she tells me that she will use her walker if we initiate the cast. I was very specific about the fact that if we were gonna do the cast I wanted to make sure that she was using the walker in order to prevent any falls. She tells me she does not have stairs that she has to traverse on [name] regular basis at her home. She has not had any seizures since last week again that something that happens to her often she tells me she did talk to [employer] and they said that it may take up to three weeks to get the brace approved for her. Hopefully that will not take that long but nonetheless in the meantime I do think the cast could be of benefit. 10/12/18 on evaluation today patient appears to be doing rather well in regard to her toe ulcer. It's just been [name] few days and already this is significantly improved both as far as overall appearance and size. Fortunately there's  no sign of infection. She is here for her first obligatory cast change. 10/19/18 Seen today for follow up and management of left great toe ulcer. Wound continues to show improvement. Noted small open area with seroussang drainage with palpation. Denies any  increased pain or recent fevers during visit. She will continue calcium alginate with offloading shoe. Denies any questions or concerns during visit. 10/26/18 on evaluation today patient appears to be doing about the same as when I last saw her in regard to her wound bed. Fortunately there does not appear to be any signs of infection. Unfortunately she continues to have [name] breakdown in regard to the toe region any time that she is not in the cast. It takes almost no time at all for this to happen. Nonetheless she still has not heard anything from the brace being made by [employer] as to when exactly this will be available to her. Fortunately there is no signs of infection at this time. 10/30/18 on evaluation today patient presents for application of the total contact cast as we just received him this morning. Fortunately we are gonna be able to apply this to her today which is great news. She continues to have no significant pain which is good news. Overall I do feel like things have been improving while she was the cast is when she doesn't have [name] cast that things get worse. She still has not really heard anything from [facility] regarding her brace. 11/02/18 upon evaluation today patient's wound already appears to be doing significantly better which is good news. Fortunately there does not appear to be any signs of infection also good news. Overall I do think the total contact cast as before is helping to heal this area unfortunately it's just not gonna likely keep the area closed and healed without her getting her brace at least. Again the foot drop is [name] significant issue for her. 11/09/18 on evaluation today patient appears to be doing excellent in regard to her toe ulcer which in fact is completely healed. Fortunately we finally got the situation squared away with the paperwork which was needed to proceed with getting her brace approved by Medicaid. I have filled that out unfortunately that information has  been sent to the orthopedic office that I worked at 2 1/2 years ago and not tired Current wound care measures. Fortunately she seems to be doing very well at this time. 11/23/18 on evaluation today patient appears to be doing More Poorly Compared to Last Time I Saw Her. At [facility] She Had Completely Healed. Currently she is continuing to have issues with reopening. She states that she just found out that the brace was approved through Medicaid now she just has to go get measured in order to have this fitted for her and then made. Subsequently she does not have an appointment for this yet that is going to complicate things we obviously cannot put her back in the cast if we do not have everything measured because they're not gonna be able to measure her foot while she is in the cast. 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 Obviously this is unfortunate and does have me somewhat worried as well. 11/30/18 on evaluation today patient's toe ulcer actually appears to be doing fairly well. The good news is she will be getting her brace in the shoes next week on Wednesday. Hopefully  we will be able to get this to heal without having to go back in the cast however she may need the cast in order to get the wound completely heal and then go from there. Fortunately there's no signs of infection at this time. 12/07/18 on evaluation today patient fortunately did receive her brace and she states she could tell this definitely makes her walk better. With that being said she's been having issues with her toe where she noticed yesterday there was [name] lot of tissue that was loosing off this appears to be much larger than what it was previous. She also states that her leg has been read putting much across the top of her foot just about the ankle although this seems to be receiving somewhat. The total area is still red and  appears to be someone infected as best I can tell. She is previously taken Bactrim and that may be [name] good option for her today as well. We are gonna see what I wound culture shows as well and I think that this is definitely appropriate. With that being said outside of the culture I still need to initiate something in the interim and that's what I'm gonna go ahead and select Bactrim is [name] good option for her. 12/14/18 on evaluation today patient appears to be doing better in regard to her left great toe ulcer as compared to last week's evaluation. There's still some erythema although this is significantly improved which is excellent news. Overall I do believe that she is making good progress is still gonna take some time before she is where I would like her to be from the standpoint of being able to place her back into the total contact cast. Hopefully we will be where we need to be by next week. 12/21/18 on evaluation today patient actually appears to be doing poorly in regard to her toe ulcer. She's been tolerating the dressing changes without complication. Fortunately there's no signs of systemic infection although she does have [name] lot of drainage from the toe ulcer and this does Cenci, Katrece [name]. ([id number]) seem to be causing some issues at this point. She does have erythema on the distal portion of her toe that appears to be likely cellulitis. 12/28/18 on evaluation today patient actually appears to be doing [name] little better in my pinion in regard to her toe ulcer. With that being said she still does have some evidence of infection at this time and for her culture she had both E. coli as well as enterococcus as organisms noted on evaluation. For that reason I think that though the Keflex likely has treated the E. coli rather well this has really done nothing for the enterococcus. We are going to have to initiate treatment for this specifically. 01/04/19 on evaluation today patient's toe actually appears  to be doing better from the standpoint of infection. She currently would like to see about putting the cash back on I think that this is appropriate as long as she takes care of it and keeps it from getting wet. She is gonna have some drainage we can definitely pass this up with Drawtex and alginate to try to prevent as much drainage as possible from causing the problems. With that being said I do want to at least try her with the cast between now and Tuesday. If there any issues we can't continue to use it then I will discontinue the use of the cast at that point. 01/08/19 on evaluation today  patient actually appears to be doing very well as far as her foot ulcer specifically the great toe on the left is concerned. She did have an area of rubbing on the medial aspect of her left ankle which again is from the cast. Fortunately there's no signs of infection at this point in this appears to be [name] very slight skin breakdown. The patient tells me she felt it rubbing but didn't think it was that bad. Fortunately there is no signs of active infection at this time which is good news. No fevers, chills, nausea, or vomiting noted at this time. 01/15/19 on evaluation today patient actually appears to be doing well in regard to her toe ulcer. Again as previous she seems to do well and she has the cast on which indicates to me that during the time she doesn't have [name] cast on she's putting way too much pressure on this region. Obviously I think that's gonna be an issue as with the current national emergency concerning the Covid-19 Virus it has been recommended that we discontinue the use of total contact casting by the chief medical officer of our company, Dr. Simona Huh. The reasoning is that if [name] patient becomes sick and cannot come into have the cast removed they could not just leave this on for an additional two weeks. Obviously the hospitals also do not want to receive patient's who are sick into the emergency  department to potentially contaminate the region and spread the Covid-19 Virus among other sick individuals within the hospital system. Therefore at this point we are suspending the use of total contact cast until the current emergency subsides. This was all discussed with the patient today as well. 01/22/19 on evaluation today patient's wound on her left great toe appears to be doing slightly worse than previously noted last week. She tells me that she has been on this quite [name] bit in fact she tells me she's been awake for 38 straight hours. This is due to the fact that she's having to care for grandparents because nobody else will. She has been taking care of them for five the last seven days since I've seen her they both have dementia his is from [name] stroke and her grandmother's was progressive. Nonetheless she states even her mom who knows her condition and situation has only help two of those days to take care of them she's been taking care of the rest. Fortunately there does not appear to be any signs of active infection in regard to her toe at this point although obviously it doesn't look as good as it did previous. I think this is directly related to her not taking off the pressure and friction by way of taking things easy. Though I completely understand what's going on. 01/29/19 on evaluation today patient's tools are actually appears to be showing some signs of improvement today compared to last week's evaluation as far as not necessarily the overall size of the wound but the fact that she has some new skin growth in between the two ends of the wound opening. Overall I feel like she has done well she states that she had [name] family member give her what sounds to be [name] CAM walker boot which has been helpful as well. 02/05/19 on evaluation today patient's wound bed actually appears to be doing significantly better in regard to her overall appearance of the size of the wound. With that being said she is  still having an issue with offloading efficiently enough  to get this to close. Apparently there is some signs of infection at this point as well unfortunately. Previously she's done well of Augmentin I really do not see anything that needs to be culture currently but there theme and cellulitis of the foot that I'm seeing I'm gonna go ahead and place her on an antibiotic today to try to help clear this up. 02/12/2019 on evaluation today patient actually appears to be doing poorly in regard to her overall wound status. She tells me she has been using her offloading shoe but actually comes in today wearing her tennis shoe with the AFO brace. Again as I previously discussed with her this is really not sufficient to allow the area to heal appropriately. Nonetheless she continues to be somewhat noncompliant and I do wonder based on what she has told my nurse in the past as to whether or not [name] good portion of this noncompliance may be recreational drug and alcohol related. She has had [name] history of heroin overdose and this was fairly recently in the past couple of months that have been seeing her. Nonetheless overall I feel like her wound looks significantly worse today compared to what it was previous. She still has significant erythema despite the Augmentin I am not sure that this is an appropriate medication for her infection I am also concerned that the infection may have gone down into her bone. 02/19/19 on evaluation today patient actually appears to be doing about the same in regard to her toe ulcer. Unfortunately she continues to show signs of bone exposure and infection at this point. There does not appear to be any evidence of worsening of the infection but I'm also not really sure that it's getting significantly better. She is on the Augmentin which should be sufficient for the Staphylococcus aureus infection that she has at this point. With that being said she may need IV antibiotics to more  appropriately treat this. We did have [name] discussion today about hyperbaric option therapy. 02/28/19 on evaluation today patient actually appears to be doing much worse in regard to the wound on her left great toe as compared to even my previous evaluation last week. Unfortunately this seems to be training in [name] pretty poor direction. Her toe was actually now starting to angle laterally and I can actually see the entire joint area of the proximal portion of the digit where is the distal portion of the digit again is no longer even in contact with the joint line. Unfortunately there's [name] lot more necrotic tissue around the edge and the toe appears to be showing signs of becoming gangrenous in my pinion. I'm very concerned about were things stand at this point. She did see infectious disease and they are planning to send in [name] prescription for Sivextro for her and apparently this has been approved. With that being said I don't think she should avoid taking this but at the same time I'm not sure that it's gonna be sufficient to save her toe at this point. She tells me that she still having to care for grandparents which I think is putting quite [name] bit of strain on her foot and specifically the total area and has caused this to break down even to [name] greater degree than would've otherwise been expected. 03/05/19 on evaluation today patient actually appears to be doing quite well in regard to her toe all things considering. She still has bone exposed but there appears to be much less your thing on  overall the appearance of the wound and the toe itself is dramatically improved. She still does have some issues currently obviously with infection she did see vascular as well and there concerned that her blood flow to the toad. For that reason they are setting up for an angiogram next week. 03/14/19 on evaluation today patient appears to be doing very poor in regard to her toe and specifically in regard to the ulceration  and the fact that she's starting to notice the toe was leaning even more towards the lateral aspect and the complete joint is visible on the proximal aspect of the joint. Nonetheless she's also noted [name] significant odor and the tip of the toe is turning more dark and necrotic appearing. Overall I think she is getting worse not better as far as this is concerned. For that reason I am recommending at this point that she likely needs to be seen for likely amputation. LAVENDER, STANKE ([id number]) READMISSION 03/19/2021 This is [name] patient that we cared for in this clinic for [name] prolonged period of time in 2019 and 2020 with [name] left foot and left first toe wound. 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 Luana Shu. In December 2021 she had an ulcer on her right great toe as well as the fourth and fifth toes. 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 She also had an angiogram at that time and underwent angioplasty of the right anterior tibial artery. In any case she claims that the wound on the right foot is closed I did not look at this today which was probably an oversight although I think that should be done next week. 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 According to Dr. Deborra Medina last review that she was out of the area being cared for by another physician but recently came back to his attention. 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 [name] culture of this area showed E. coli apparently before she came back to see Dr. Luana Shu she was supposed to be receiving antibiotics but she did not really take them. Nor is she offloading this area at all. Finally her last hemoglobin A1c listed in epic was in March 2022 at 14.1 she says things are [name] lot better since then although I am not sure. 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 She had this wound at the time. During this admission she had E. coli sepsis [name] TEE was negative. 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 She is still [name] heavy cigarette smoker. She has [name] history of bipolar disorder seizure disorder ABI in our clinic was 1.05 6/1; left midfoot in the setting of [name] TMA done previously. Round circular wound with [name] "knuckle" of protruding tissue. The problem is that the knuckle was not attached to any of the surrounding granulation and this probed proximally widely I removed [name] large portion of this tissue. This wound goes with considerable undermining laterally. I do not feel any bone there was no purulence but this is  [name] deep wound. 6/8; in spite of the debridement I did last week. She arrives with [name] wound looking exactly the same. [name] protruding "knuckle" of tissue nonadherent to most of the surrounding tissue. There is considerable depth around this from 6-12 o'clock at 2.7 cm and undermining of 1 cm. This does not look overtly infected and the x-ray I did last week was negative for any osseous abnormalities. We have been using silver collagen 6/15; deep tissue culture I did last week showed moderate staph aureus and moderate Pseudomonas. This will definitely require prolonged antibiotic therapy. The pathology on the protuberant area was negative for malignancy fungus etc. 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 We have been using silver collagen. I am going to be prescribing Levaquin for 2 weeks. Her CT scan of the foot is down for  7/5 6/22; CT scan of the foot on 7 5. She says she has hardware in the left leg from her previous fracture. She is on the Levaquin for the deep tissue culture I did that showed methicillin sensitive staph aureus and Pseudomonas. I gave her [name] 2-week supply and she will have another week. She arrives in clinic today with the same protuberant tissue however this is nonadherent to the tissue surrounding it. I am really at [name] loss to explain this unless there is underlying deep tissue infection 6/29; patient presents for 1 week follow-up. She has been using collagen to the wound bed. She reports taking her antibiotics as prescribed.She has no complaints or issues today. She denies signs of infection. 7/6; patient presents for one week followup. She has been using collagen to the wound bed. She states she is taking Levaquin however at times she is not able to keep it down. She denies signs of infection. 7/13; patient presents for 1 week follow-up. She has been using silver alginate to the wound bed. She still has nausea when taking her antibiotics. She denies signs of infection. 7/20; patient presents for 1 week follow-up. She has been using silver alginate with gentamicin cream to the wound bed. She denies any issues and has no complaints today. She denies signs of infection. 7/27; patient presents for 1 week follow-up. She continues to use silver alginate with gentamicin cream to the wound bed. She reports starting her antibiotics. She has no issues or complaints. Overall she reports stability to the wound. 8/3; patient presents for 1 week follow-up. She has been using silver alginate with gentamicin cream to the wound bed. She reports completing all antibiotics. She has no issues or complaints today. She denies signs of infection. 8/17; patient presents for 2-week follow-up. He is to use silver alginate to the wound bed. She has no issues or complaints today. She denies signs of infection. She reports  her pain has improved in her foot since last clinic visit 8/24; patient presents for 1 week follow-up. She continues to use silver alginate to the wound bed. She has no issues or complaints. She denies signs of infection. Pain is stable. 9/7; patient presents for follow-up. She missed her last week appointment due to feeling ill. She continues to use silver alginate. She has [name] new wound to the right lower extremity that is covered in eschar. She states It occurred over the past week and has no idea how it started. She currently denies signs of infection. 9/14; patient presents for follow-up. To the left foot wound she has been using gentamicin cream and silver alginate.  To the right lower extremity wound she has been keeping this covered and has not obtain Santyl. 9/21; patient presents for follow-up. She reports using gentamicin cream and silver alginate to the left foot and Santyl to the right lower extremity wound. She has no issues or complaints today. She denies signs of infection. 9/28; patient presents for follow-up. She reports [name] new wound to her right heel. She states this occurred [name] few days ago and is progressively gotten worse. She has been trying to clean the area with [name] Q-tip and Santyl. She reports stability in the other 2 wounds. She has been using gentamicin cream and silver alginate to the left foot and Santyl to the right lower extremity wound. 10/12; patient presents for follow-up. She reports improvement to the wound beds. She is seeing vein and vascular to discuss the potential of [name] left BKA. She states they are going to do an arteriogram. She continues to use silver alginate with dressing changes to her wounds. 11/2; patient presents for follow-up. She states she has not been doing dressing changes to the wound beds. She states she is not able to offload the areas. She reports chronic pain to her left foot wound. 11/9; patient presents for follow-up. She came in with only  socks on. She states she forgot to put on shoes. It is unclear if she is doing any dressing changes. She currently denies systemic signs of infection. 11/16; patient presents for follow-up. She came again only with socks on. She states she does not wear shoes ever. It is unclear if she does dressing changes. She currently denies systemic signs of infection. 11/23; patient presents for follow-up. She wore her shoes today. It still unclear exactly what dressing she is using for each wound but she did states she obtained Dakin's solution and has been using this to the left foot wound. She currently denies signs of infection. KEILANA, MORLOCK ([id number]) 11/30; patient presents for follow-up. She has no issues or complaints today. She currently denies signs of infection. 12/7; patient presents for follow-up. She has no issues or complaints today. She has been using Hydrofera Blue to the right heel wound and Dakin solution to the left foot wound. Her right anterior leg wound is healed. She currently denies signs of infection. 12/14; patient presents for follow-up. She has been using Hydrofera Blue to the right heel and Dakin's to the left foot wounds. She has no issues or complaints today. She denies signs of infection. 12/21; patient presents for follow-up. She reports using Hydrofera Blue to the right heel and Dakin's to the left foot wound. She denies signs of infection. 12/28; patient presents for follow-up. She continues to use Dakin's to the left foot wound and Hydrofera Blue to the right heel wound. She denies signs of infection. 1/4; patient presents for follow-up. She has no issues or complaints today. She denies signs of infection. 1/11; patient presents for follow-up. It is unclear if she has been dressing these wounds over the past week. She currently denies signs of infection. 1/18; patient presents for follow-up. She states she has been using Dakin's wet-to-dry dressings to the left  foot. She has been using Hydrofera Blue to the right foot foot wound. She states that the anterior right leg wound has reopened and draining serous fluid. She denies signs of infection. 1/25; patient presents for follow-up. She has no issues or complaints today. 2/1; patient presents for follow-up. She has no issues or complaints today. She denies signs of  infection. 2/8; patient presents for follow-up. She has lost her surgical shoes. She did not have [name] dressing to the right heel wound. She currently denies signs of infection. 2/15; patient presents for follow-up. She reports more pain to the right heel today. She denies purulent drainage Or fever/chills 2/22; patient presents for follow-up. She reports taking clindamycin over the past week. She states that she continues to have pain to her right heel. She reports purulent drainage. Readmission 03/16/2022 Ms. Shalon Salado is [name] 46 year old female with [name] past medical history of type 2 diabetes, osteomyelitis to her feet, chronic systolic heart failure and bipolar disorder that presents to the clinic for bilateral feet wounds and right lower extremity wound. She was last seen in our clinic on 12/15/2021. At that time she had purulent drainage coming out of her right plantar foot and I recommended she go to the ED. She states she went to [facility] and has been there for the past 3 months. I cannot see the records. She states she had OR debridement and was on several weeks of IV antibiotics while inpatient. Since discharge she has not been taking care of the wound beds. She had nothing on her feet other than socks today. She currently denies signs of infection. 5/31; patient presents for follow-up. She has been using Dakin's wet-to-dry dressings to the wound beds on her feet bilaterally and antibiotic ointment to the right anterior leg wound. She had [name] wound culture done at last clinic visit that showed moderate Pseudomonas  aeruginosa sensitive to ciprofloxacin. She currently denies systemic signs of infection. 6/14; patient presents for follow-up. She received Keystone 5 days ago and has been using this on the wound beds. She states that last week she had to go to the hospital because she had increased warmth and erythema to the right foot. She was started on 2 oral antibiotics. She states she has been taking these. She currently denies systemic signs of infection. She has no issues or complaints today. 6/21; patient presents for follow-up. She states she has been using Keystone antibiotics to the wound beds. She has no issues or complaints today. She denies signs of infection. 6/28; patient presents for follow-up. She has been using Keystone antibiotics to the wound beds. She has no issues or complaints today. 7/12; patient presents for follow-up. Has been using Keystone antibiotics to the wound beds with calcium alginate. She has no issues or complaints today. She never followed up with her orthopedic surgeon who did the OR debridement to the right foot. We discussed the total contact cast for the left foot and patient would like to do this next week. 7/19; patient presents for follow-up. She has been using Keystone antibiotics with calcium alginate to the wound beds. She has no issues or complaints today. Patient is in agreement to do the total contact cast of the left foot today. She knows to return later this week for the obligatory cast change. 05-13-2022 upon evaluation today patient's wound which she has the cast of the left leg actually appears to be doing significantly better. Fortunately I do not see any signs of active infection locally or systemically which is great news and overall I am extremely pleased with where we stand currently. 7/26; patient presents for follow-up. She has [name] cast in place for the past week. She states it irritated her shin. Other than that she tolerated the cast well. She states  she would like [name] break for 1 week from the cast.  We have been using Keystone antibiotic and Aquacel to both wound beds. She denies signs of infection. 8/2; patient presents for follow-up. She has been using Keystone and Aquacel to the wound beds. She denies any issues and has no complaints. She is agreeable to have the cast placed today for the left leg. 06-03-2022 upon evaluation today patient appears to be doing well with regard to her wound she saw some signs of improvement which is great news. Fortunately I do not see any evidence of active infection locally or systemically at this time which is great news. No fevers, chills, nausea, vomiting, or diarrhea. ANGALENA, COUSINEAU [name]. ([id number]) 8/16; patient presents for follow-up. She has no issues or complaints today. We have been using Keystone and Aquacel to the wound beds. The left lower extremity is in [name] total contact cast. She is tolerated this well. 8/23; patient presents for follow-up. She has had the total contact cast on the left leg for the past week. Unfortunately this has rubbed and broken down the skin to the medial foot. She currently denies signs of infection. She has been using Keystone antibiotic to the right plantar foot wound. Objective Constitutional Vitals Time Taken: 3:09 PM, Temperature: 98.1 F, Pulse: 55 bpm, Respiratory Rate: 18 breaths/min, Blood Pressure: 108/57 mmHg. General Notes: Right foot: To the plantar heel there is an incision site with increased depth. Nonviable tissue at the surface, callus present. No probing to bone. Left foot: To the medial aspect there is an open wound with granulation tissue and nonviable tissue. Area of skin breakdown to the medial foot. Blue/green Drainage to the periwound Integumentary (Hair, Skin) Wound #11 status is Open. Original cause of wound was Surgical Injury. The date acquired was: 12/01/2021. The wound has been in treatment 13 weeks. The wound is located on the [place]. The wound measures 0.7cm length x 0.4cm width x 0.5cm depth; 0.22cm^2 area and 0.11cm^3 volume. There is Fat Layer (Subcutaneous Tissue) exposed. There is [name] medium amount of serosanguineous drainage noted. The wound margin is distinct with the outline attached to the wound base. There is medium (34-66%) pink, pale granulation within the wound bed. There is [name] medium (34-66%) amount of necrotic tissue within the wound bed including Adherent Slough. Wound #12 status is Open. Original cause of wound was Pressure Injury. The date acquired was: 03/16/2020. The wound has been in treatment 13 weeks. The wound is located on the [place]. The wound measures 7.5cm length x 5.5cm width x 0.2cm depth; 32.398cm^2 area and 6.48cm^3 volume. There is Fat Layer (Subcutaneous Tissue) exposed. There is [name] medium amount of serous drainage noted. The wound margin is flat and intact. There is large (67-100%) red, pink granulation within the wound bed. There is [name] small (1-33%) amount of necrotic tissue within the wound bed including Adherent Slough. 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 I debrided nonviable tissue and recommended continuing Keystone antibiotics and calcium alginate here. Unfortunately she did not tolerate the cast well this past week and it created skin breakdown to the medial foot. Overall the wound does appear well-healing and  I debrided nonviable tissue. She does have mild blue/green drainage to the periwound consistent with Pseudomonas. She  has Keystone antibiotic that covers this. At this time I recommended taking [name] break from the cast and doing [facility] antibiotic daily to the area. No surrounding soft tissue infection. No need for oral antibiotics at this time. Continue aggressive offloading with surgical shoes. Procedures Wound #11 Pre-procedure diagnosis of Wound #11 is an Open Surgical Wound located on the Right,Plantar Foot . There was [name] Excisional Skin/Subcutaneous Tissue Debridement with [name] total area of 0.28 sq cm performed by Kalman Shan, MD. With the following instrument(s): Curette to remove Viable and Non-Viable tissue/material. Material removed includes Callus, Subcutaneous Tissue, and Slough. [name] time out was conducted at 15:46, prior to the start of the procedure. [name] Minimum amount of bleeding was controlled with Pressure. The procedure was tolerated well. Post Debridement Measurements: 0.7cm length x 0.4cm width x 0.5cm depth; 0.11cm^3 volume. Character of Wound/Ulcer Post Debridement is stable. DEMETRIC, PARSLOW [name]. ([id number]) Post procedure Diagnosis Wound #11: Same as Pre-Procedure Wound #12 Pre-procedure diagnosis of Wound #12 is [name] Diabetic Wound/Ulcer of the Lower Extremity located on the Left,Medial,Plantar Foot .Severity of Tissue Pre Debridement is: Fat layer exposed. There was [name] Excisional Skin/Subcutaneous Tissue Debridement with [name] total area of 41.25 sq cm performed by Kalman Shan, MD. With the following instrument(s): Curette to remove Viable and Non-Viable tissue/material. Material removed includes Subcutaneous Tissue and Slough and. [name] time out was conducted at 15:41, prior to the start of the procedure. [name] Minimum amount of bleeding was controlled with Pressure. The procedure was tolerated well. Post Debridement Measurements: 7.5cm length x 5.5cm width x 0.2cm depth; 6.48cm^3 volume. Character of Wound/Ulcer Post Debridement is stable. Severity of Tissue Post Debridement is: Fat layer exposed. Post  procedure Diagnosis Wound #12: Same as Pre-Procedure Plan Follow-up Appointments: Return Appointment in 1 week. Nurse Visit as needed Bathing/ Shower/ Hygiene: Wash wounds with antibacterial soap and water. No tub bath. Anesthetic (Use 'Patient Medications' Section for Anesthetic Order Entry): Lidocaine applied to wound bed Edema Control - Lymphedema / Segmental Compressive Device / Other: Elevate, Exercise Daily and Avoid Standing for Long Periods of Time. Elevate legs to the level of the heart and pump ankles as often as possible Elevate leg(s) parallel to the floor when sitting. Off-Loading: Total Contact Cast to Left Lower Extremity - No cast for this week contact cast #3 applied Don't get cast wet Other: - keep pressure off of feet. Medications-Please add to medication list.: [employer] - Keystone gel Topical Antibiotic - AandD ointment to broken down area WOUND #11: - Foot Wound Laterality: Plantar, Right Cleanser: Soap and Water 1 x Per Day/30 Days Discharge Instructions: Gently cleanse wound with antibacterial soap, rinse and pat dry prior to dressing wounds Topical: keystone gel 1 x Per Day/30 Days Primary Dressing: Aquacel Extra Hydrofiber Dressing, 4x5 (in/in) (Generic) 1 x Per Day/30 Days Discharge Instructions: packed into wound Secondary Dressing: ABD Pad 5x9 (in/in) (Generic) 1 x Per Day/30 Days Discharge Instructions: Cover with ABD pad Secured With: Medipore Tape - 57M Medipore H Soft Cloth Surgical Tape, 2x2 (in/yd) (Generic) 1 x Per Day/30 Days Secured With: Kerlix Roll Sterile or Non-Sterile 6-ply 4.5x4 (yd/yd) (Generic) 1 x Per Day/30 Days Discharge Instructions: Apply Kerlix as directed WOUND #12: - Foot Wound Laterality: Plantar, Left, Medial Cleanser: Soap and Water 1 x Per Day/30 Days Discharge Instructions: Gently cleanse wound with antibacterial soap, rinse and pat dry prior to dressing wounds Topical:  Vitamin AandD Ointment, 4(oz) tube 1 x Per  Day/30 Days Discharge Instructions: apply to red, broken down area Topical: keystone gel 1 x Per Day/30 Days Discharge Instructions: apply very thin layer to wound Primary Dressing: Aquacel Extra Hydrofiber Dressing, 4x5 (in/in) (Generic) 1 x Per Day/30 Days Discharge Instructions: placed over keystone gel Secondary Dressing: ABD Pad 5x9 (in/in) (Generic) 1 x Per Day/30 Days Discharge Instructions: Cover with ABD pad Secured With: Medipore Tape - 47M Medipore H Soft Cloth Surgical Tape, 2x2 (in/yd) (Generic) 1 x Per Day/30 Days Secured With: Kerlix Roll Sterile or Non-Sterile 6-ply 4.5x4 (yd/yd) (Generic) 1 x Per Day/30 Days Discharge Instructions: Apply Kerlix as directed 1. In office sharp debridement 2. Keystone antibiotic and calcium alginate 3. Aggressive offloading 4. Follow-up in 1 week Electronic Signature(s) Signed: 06/15/2022 4:16:48 PM By: Kalman Shan DO Entered By: Kalman Shan on 06/15/2022 16:15:37 [name] ([id number]) [name], Clema AMarland Kitchen ([id number]) -------------------------------------------------------------------------------- SuperBill Details Patient Name: [name] Date of Service: 06/15/2022 Medical Record Number: [id number] Patient Account Number: [id number] Date of Birth/Sex: 08-10-1976 (46 y.o. F) Treating RN: Carlene Coria Primary Care Provider: Raelene Bott Other Clinician: Massie Kluver Referring Provider: Raelene Bott Treating Provider/Extender: Yaakov Guthrie in Treatment: 13 Diagnosis Coding ICD-10 Codes Code Description [phone] 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ity 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.621 Type 2 diabetes mellitus with foot ulcer E11.42 Type 2 diabetes mellitus with diabetic polyneuropathy L97.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 of skin Facility  Procedures CPT4 Code: [id number] Description: 83382 - DEB SUBQ TISSUE 20 SQ CM/< Modifier: Quantity: 1 CPT4 Code: Description: ICD-10 Diagnosis Description 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 L97.512 Non-pressure chronic ulcer of other part of right foot with fat layer expo Modifier: severity sed Quantity: CPT4 Code: [id number] Description: 34193 - DEB SUBQ TISS EA ADDL 20CM Modifier: Quantity: 2 CPT4 Code: Description: ICD-10 Diagnosis Description 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 L97.512 Non-pressure chronic ulcer of other part of right foot with fat layer expo Modifier: severity sed Quantity: Physician Procedures CPT4 Code: [id number] Description: 11042 - WC PHYS SUBQ TISS 20 SQ CM Modifier: Quantity: 1 CPT4 Code: Description: ICD-10 Diagnosis Description 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 L97.512 Non-pressure chronic ulcer of other part of right foot with fat layer expos Modifier: severity ed Quantity: CPT4 Code: [id number] Description: 11045 - WC PHYS SUBQ TISS EA ADDL 20 CM Modifier: Quantity: 2 CPT4 Code: Description: ICD-10 Diagnosis Description 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 L97.512 Non-pressure chronic ulcer of other part of right foot with fat layer expos Modifier: severity ed Quantity: Electronic Signature(s) Signed: 06/15/2022 4:16:48 PM By: Kalman Shan DO Entered By: Kalman Shan on 06/15/2022 16:15:58

## 2022-06-16 NOTE — Progress Notes (Signed)
SARALYNN, LANGHORST ([id number]) Visit Report for 06/15/2022 Arrival Information Details Patient Name: [name], [name] Date of Service: 06/15/2022 3:00 PM Medical Record Number: [id number] Patient Account Number: [id number] Date of Birth/Sex: 1975/12/25 (46 y.o. F) Treating RN: Carlene Coria Primary Care Leondra Cullin: Raelene Bott Other Clinician: Massie Kluver Referring Cordell Coke: Raelene Bott Treating Toryn Dewalt/Extender: Yaakov Guthrie in Treatment: 13 Visit Information History Since Last Visit All ordered tests and consults were completed: No Patient Arrived: Wheel Chair Added or deleted any medications: No Arrival Time: 15:00 Any new allergies or adverse reactions: No Transfer Assistance: EasyPivot Patient Lift Had a fall or experienced change in No activities of daily living that may affect risk of falls: Hospitalized since last visit: No Pain Present Now: Yes Electronic Signature(s) Signed: 06/15/2022 3:38:29 PM By: Massie Kluver Entered By: Massie Kluver on 06/15/2022 15:08:16 [name], [name] ([id number]) -------------------------------------------------------------------------------- Clinic Level of Care Assessment Details Patient Name: [name] A. Date of Service: 06/15/2022 3:00 PM Medical Record Number: [id number] Patient Account Number: [id number] Date of Birth/Sex: 1976-02-07 (46 y.o. F) Treating RN: Carlene Coria Primary Care Jamerson Vonbargen: Raelene Bott Other Clinician: Massie Kluver Referring Sean Macwilliams: Raelene Bott Treating Rily Nickey/Extender: Yaakov Guthrie in Treatment: 13 Clinic Level of Care Assessment Items TOOL 1 Quantity Score _0  - Use when EandM and Procedure is performed on INITIAL visit 0 ASSESSMENTS - Nursing Assessment / Reassessment _1  - General Physical Exam (combine w/ comprehensive assessment (listed just below) when performed on new 0 pt. evals) _2  - 0 Comprehensive Assessment (HX, ROS, Risk Assessments, Wounds Hx,  etc.) ASSESSMENTS - Wound and Skin Assessment / Reassessment _3  - Dermatologic / Skin Assessment (not related to wound area) 0 ASSESSMENTS - Ostomy and/or Continence Assessment and Care _4  - Incontinence Assessment and Management 0 _5  - 0 Ostomy Care Assessment and Management (repouching, etc.) PROCESS - Coordination of Care _6  - Simple Patient / Family Education for ongoing care 0 _7  - 0 Complex (extensive) Patient / Family Education for ongoing care _8  - 0 Staff obtains Programmer, systems, Records, Test Results / Process Orders _9  - 0 Staff telephones HHA, Nursing Homes / Clarify orders / etc _10  - 0 Routine Transfer to another Facility (non-emergent condition) _11  - 0 Routine Hospital Admission (non-emergent condition) _12  - 0 New Admissions / Biomedical engineer / Ordering NPWT, Apligraf, etc. _13  - 0 Emergency Hospital Admission (emergent condition) PROCESS - Special Needs _14  - Pediatric / Minor Patient Management 0 _15  - 0 Isolation Patient Management _16  - 0 Hearing / Language / Visual special needs _17  - 0 Assessment of Community assistance (transportation, D/C planning, etc.) _18  - 0 Additional assistance / Altered mentation _19  - 0 Support Surface(s) Assessment (bed, cushion, seat, etc.) INTERVENTIONS - Miscellaneous _20  - External ear exam 0 _21  - 0 Patient Transfer (multiple staff / Civil Service fast streamer / Similar devices) _22  - 0 Simple Staple / Suture removal (25 or less) _23  - 0 Complex Staple / Suture removal (26 or more) _24  - 0 Hypo/Hyperglycemic Management (do not check if billed separately) _25  - 0 Ankle / Brachial Index (ABI) - do not check if billed separately Has the patient been seen at the hospital within the last three years: Yes Total Score: 0 Level Of Care: ____ [name] ([id number]) Electronic Signature(s) Signed: 06/16/2022 4:37:38 PM By: Massie Kluver Entered By: Massie Kluver on 06/15/2022 15:50:02 [name], [name]  ([id number]) -------------------------------------------------------------------------------- Encounter Discharge Information Details Patient Name: [name] A. Date of Service: 06/15/2022 3:00 PM Medical Record Number: [id number] Patient Account Number:  [id number] Date of Birth/Sex: Nov 23, 1975 (46 y.o. F) Treating RN: Carlene Coria Primary Care Latoy Labriola: Raelene Bott Other Clinician: Massie Kluver Referring Kyleigh Nannini: Raelene Bott Treating Clariece Roesler/Extender: Yaakov Guthrie in Treatment: 13 Encounter Discharge Information Items Post Procedure Vitals Discharge Condition: Stable Temperature (F): 98.1 Ambulatory Status: Wheelchair Pulse (bpm): 55 Discharge Destination: Home Respiratory Rate (breaths/min): 18 Transportation: Private Auto Blood Pressure (mmHg): 108/57 Accompanied By: mother Schedule Follow-up Appointment: No Clinical Summary of Care: Electronic Signature(s) Signed: 06/16/2022 4:37:38 PM By: Massie Kluver Entered By: Massie Kluver on 06/15/2022 17:19:19 [name], [name] ([id number]) -------------------------------------------------------------------------------- Lower Extremity Assessment Details Patient Name: [name] A. Date of Service: 06/15/2022 3:00 PM Medical Record Number: [id number] Patient Account Number: [id number] Date of Birth/Sex: [birth year] (46 y.o. F) Treating RN: Carlene Coria Primary Care Ladavia Lindenbaum: Raelene Bott Other Clinician: Massie Kluver Referring Macaela Presas: Raelene Bott Treating Zackari Ruane/Extender: Yaakov Guthrie in Treatment: 13 Electronic Signature(s) Signed: 06/15/2022 3:38:29 PM By: Massie Kluver Signed: 06/16/2022 4:19:23 PM By: Carlene Coria RN Entered By: Massie Kluver on 06/15/2022 15:31:54 [name], [name] ([id number]) -------------------------------------------------------------------------------- Multi Wound Chart Details Patient Name: [name] A. Date of Service: 06/15/2022 3:00 PM Medical  Record Number: [id number] Patient Account Number: [id number] Date of Birth/Sex: [birth year] (46 y.o. 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 Comorbid History: Chronic sinus problems/congestion, Chronic sinus problems/congestion, N/A Middle ear problems, Anemia, Middle ear problems, Anemia, Chronic Obstructive Pulmonary Chronic Obstructive Pulmonary Disease (COPD), Congestive Heart Disease (COPD), Congestive Heart Failure, Type II Diabetes, End Stage Failure, Type II Diabetes, End Stage Renal Disease, History of pressure Renal Disease, History of pressure wounds, Neuropathy wounds, Neuropathy Date Acquired: 12/01/2021 03/16/2020 N/A Weeks of Treatment: 13 13 N/A Wound Status: Open Open N/A Wound Recurrence: No No N/A Measurements L x W x D (cm) 0.7x0.4x0.5 7.5x5.5x0.2 N/A Area (cm) : 0.22 32.398 N/A Volume (cm) : 0.11 6.48 N/A % Reduction in Area: 88.10% -587.60% N/A % Reduction in Volume: 93.40% -587.90% N/A Classification: Full Thickness Without Exposed Grade 3 N/A Support Structures Exudate Amount: Medium Medium N/A Exudate Type: Serosanguineous Serous N/A Exudate Color: red, brown amber N/A Wound Margin: Distinct, outline attached Flat and Intact N/A Granulation Amount: Medium (34-66%) Large (67-100%) N/A Granulation Quality: Pink, Pale Red, Pink N/A Necrotic Amount: Medium (34-66%) Small (1-33%) N/A Exposed Structures: Fat Layer  (Subcutaneous Tissue): Fat Layer (Subcutaneous Tissue): N/A Yes Yes Fascia: No Fascia: No Tendon: No Tendon: No Muscle: No Muscle: No Joint: No Joint: No Bone: No Bone: No Epithelialization: Small (1-33%) Small (1-33%) N/A Treatment Notes MINDA, FAAS A. ([id number]) Electronic Signature(s) Signed: 06/15/2022 3:38:29 PM By: Massie Kluver Entered By: Massie Kluver on 06/15/2022 15:32:34 [name] ([id number]) -------------------------------------------------------------------------------- [facility] Details Patient Name: [name] A. Date of Service: 06/15/2022 3:00 PM Medical Record Number: [id number] Patient Account Number: [id number] Date of Birth/Sex: 07-02-1976 (46 y.o. F) Treating RN: Carlene Coria Primary Care Kendalynn Wideman: Raelene Bott Other Clinician: Massie Kluver Referring Blondell Laperle: Raelene Bott Treating Hazely Sealey/Extender: Yaakov Guthrie in Treatment: 52 Active Inactive Abuse / Safety / Falls / Self Care Management Nursing Diagnoses: History of Falls Potential for falls Potential for injury  related to falls Goals: Patient will not develop complications from immobility Date Initiated: 03/17/2022 Date Inactivated: 04/20/2022 Target Resolution Date: 03/16/2022 Goal Status: Met Patient/caregiver will verbalize understanding of skin care regimen Date Initiated: 03/17/2022 Target Resolution Date: 03/16/2022 Goal Status: Active Patient/caregiver will verbalize/demonstrate measure taken to improve self care Date Initiated: 03/17/2022 Target Resolution Date: 03/16/2022 Goal Status: Active Interventions: Assess fall risk on admission and as needed Provide education on basic hygiene Provide education on personal and home safety Notes: Necrotic Tissue Nursing Diagnoses: Impaired tissue integrity related to necrotic/devitalized tissue Knowledge deficit related to management of necrotic/devitalized  tissue Goals: Necrotic/devitalized tissue will be minimized in the wound bed Date Initiated: 03/17/2022 Date Inactivated: 05/04/2022 Target Resolution Date: 03/16/2022 Goal Status: Met Patient/caregiver will verbalize understanding of reason and process for debridement of necrotic tissue Date Initiated: 03/17/2022 Target Resolution Date: 03/16/2022 Goal Status: Active Interventions: Provide education on necrotic tissue and debridement process Treatment Activities: Apply topical anesthetic as ordered : 03/16/2022 Excisional debridement : 03/16/2022 Notes: Osteomyelitis Nursing Diagnoses: Infection: osteomyelitis Knowledge deficit related to disease process and management ETHELYNE, ERICH ([id number]) Goals: Patient/caregiver will verbalize understanding of disease process and disease management Date Initiated: 03/17/2022 Date Inactivated: 04/20/2022 Target Resolution Date: 03/16/2022 Goal Status: Met Patient's osteomyelitis will resolve Date Initiated: 03/17/2022 Target Resolution Date: 03/16/2022 Goal Status: Active Signs and symptoms for osteomyelitis will be recognized and promptly addressed Date Initiated: 03/17/2022 Target Resolution Date: 03/16/2022 Goal Status: Active Interventions: Assess for signs and symptoms of osteomyelitis resolution every visit Provide education on osteomyelitis Treatment Activities: Systemic antibiotics : 03/16/2022 Notes: Wound/Skin Impairment Nursing Diagnoses: Impaired tissue integrity Knowledge deficit related to smoking impact on wound healing Knowledge deficit related to ulceration/compromised skin integrity Goals: Patient/caregiver will verbalize understanding of skin care regimen Date Initiated: 03/17/2022 Date Inactivated: 04/20/2022 Target Resolution Date: 03/16/2022 Goal Status: Met Ulcer/skin breakdown will have a volume reduction of 30% by week 4 Date Initiated: 03/17/2022 Target Resolution Date: 04/13/2022 Goal Status:  Active Ulcer/skin breakdown will have a volume reduction of 50% by week 8 Date Initiated: 03/17/2022 Target Resolution Date: 05/04/2022 Goal Status: Active Ulcer/skin breakdown will have a volume reduction of 80% by week 12 Date Initiated: 03/17/2022 Target Resolution Date: 06/01/2022 Goal Status: Active Ulcer/skin breakdown will heal within 14 weeks Date Initiated: 03/17/2022 Target Resolution Date: 06/15/2022 Goal Status: Active Interventions: Assess ulceration(s) every visit Provide education on ulcer and skin care Treatment Activities: Skin care regimen initiated : 03/16/2022 Notes: Electronic Signature(s) Signed: 06/15/2022 3:38:29 PM By: Massie Kluver Signed: 06/16/2022 4:19:23 PM By: Carlene Coria RN Entered By: Massie Kluver on 06/15/2022 15:32:00 Lewallen, Genavive A. ([id number]) -------------------------------------------------------------------------------- Pain Assessment Details Patient Name: [name] A. Date of Service: 06/15/2022 3:00 PM Medical Record Number: [id number] Patient Account Number: [id number] Date of Birth/Sex: 12/21/1975 (46 y.o. F) Treating RN: Carlene Coria Primary Care Evanthia Maund: Raelene Bott Other Clinician: Massie Kluver Referring Jaman Aro: Raelene Bott Treating Alegandra Sommers/Extender: Yaakov Guthrie in Treatment: 13 Active Problems Location of Pain Severity and Description of Pain Patient Has Paino Yes Site Locations Pain Location: Pain in Ulcers Duration of the Pain. Constant / Intermittento Constant Rate the pain. Current Pain Level: 6 Character of Pain Describe the Pain: Aching, Burning Pain Management and Medication Current Pain Management: Medication: Yes Rest: Yes Electronic Signature(s) Signed: 06/15/2022 3:38:29 PM By: Massie Kluver Signed: 06/16/2022 4:19:23 PM By: Carlene Coria RN Entered By: Massie Kluver on 06/15/2022 15:15:00 [name]  ([id number]) -------------------------------------------------------------------------------- Patient/Caregiver Education Details Patient Name: [name] A. Date of Service: 06/15/2022  3:00 PM Medical Record Number: [id number] Patient Account Number: [id number] Date of Birth/Gender: 1976/03/22 (46 y.o. F) Treating RN: Carlene Coria Primary Care Physician: Raelene Bott Other Clinician: Massie Kluver Referring Physician: Raelene Bott Treating Physician/Extender: Yaakov Guthrie in Treatment: 13 Education Assessment Education Provided To: Patient and Caregiver Education Topics Provided Wound/Skin Impairment: Handouts: Other: continue wound care as directed Methods: Explain/Verbal Responses: State content correctly Electronic Signature(s) Signed: 06/16/2022 4:37:38 PM By: Massie Kluver Entered By: Massie Kluver on 06/15/2022 16:07:53 [name], [name] ([id number]) -------------------------------------------------------------------------------- Wound Assessment Details Patient Name: [name] A. Date of Service: 06/15/2022 3:00 PM Medical Record Number: [id number] Patient Account Number: [id number] Date of Birth/Sex: November 07, 1975 (46 y.o. F) Treating RN: Carlene Coria Primary Care Aneliese Beaudry: Raelene Bott Other Clinician: Massie Kluver Referring Mackenna Kamer: Raelene Bott Treating Malajah Oceguera/Extender: Yaakov Guthrie in Treatment: 13 Wound Status Wound Number: 11 Primary Open Surgical Wound Etiology: Wound Location: Right, Plantar Foot Wound Open Wounding Event: Surgical Injury Status: Date Acquired: 12/01/2021 Comorbid Chronic sinus problems/congestion, Middle ear problems, Weeks Of Treatment: 13 History: Anemia, Chronic Obstructive Pulmonary Disease (COPD), Clustered Wound: No Congestive Heart Failure, Type II Diabetes, End Stage Renal Disease, History of pressure wounds, Neuropathy Photos Wound Measurements Length: (cm) 0.7 Width: (cm) 0.4 Depth:  (cm) 0.5 Area: (cm) 0.22 Volume: (cm) 0.11 % Reduction in Area: 88.1% % Reduction in Volume: 93.4% Epithelialization: Small (1-33%) Wound Description Classification: Full Thickness Without Exposed Support Structures Wound Margin: Distinct, outline attached Exudate Amount: Medium Exudate Type: Serosanguineous Exudate Color: red, brown Foul Odor After Cleansing: No Slough/Fibrino Yes Wound Bed Granulation Amount: Medium (34-66%) Exposed Structure Granulation Quality: Pink, Pale Fascia Exposed: No Necrotic Amount: Medium (34-66%) Fat Layer (Subcutaneous Tissue) Exposed: Yes Necrotic Quality: Adherent Slough Tendon Exposed: No Muscle Exposed: No Joint Exposed: No Bone Exposed: No Treatment Notes Wound #11 (Foot) Wound Laterality: Plantar, Right Cleanser Soap and Water Discharge Instruction: Gently cleanse wound with antibacterial soap, rinse and pat dry prior to dressing wounds [name], [name] ([id number]) Peri-Wound Care Topical keystone gel Primary Dressing Aquacel Extra Hydrofiber Dressing, 4x5 (in/in) Discharge Instruction: packed into wound Secondary Dressing ABD Pad 5x9 (in/in) Discharge Instruction: Cover with ABD pad Secured With Medipore Tape - 68M Medipore H Soft Cloth Surgical Tape, 2x2 (in/yd) Kerlix Roll Sterile or Non-Sterile 6-ply 4.5x4 (yd/yd) Discharge Instruction: Apply Kerlix as directed Compression Wrap Compression Stockings Add-Ons Electronic Signature(s) Signed: 06/15/2022 3:38:29 PM By: Massie Kluver Signed: 06/16/2022 4:19:23 PM By: Carlene Coria RN Entered By: Massie Kluver on 06/15/2022 15:30:43 [name], [name] ([id number]) -------------------------------------------------------------------------------- Wound Assessment Details Patient Name: [name] A. Date of Service: 06/15/2022 3:00 PM Medical Record Number: [id number] Patient Account Number: [id number] Date of Birth/Sex: 12-13-1975 (46 y.o. F) Treating RN: Carlene Coria Primary  Care Khayla Koppenhaver: Raelene Bott Other Clinician: Massie Kluver Referring Koralynn Greenspan: Raelene Bott Treating Seena Face/Extender: Yaakov Guthrie in Treatment: 13 Wound Status Wound Number: 12 Primary Diabetic Wound/Ulcer of the Lower Extremity Etiology: Wound Location: Left, Medial, Plantar Foot Wound Open Wounding Event: Pressure Injury Status: Date Acquired: 03/16/2020 Comorbid Chronic sinus problems/congestion, Middle ear problems, Weeks Of Treatment: 13 History: Anemia, Chronic Obstructive Pulmonary Disease (COPD), Clustered Wound: No Congestive Heart Failure, Type II Diabetes, End Stage Renal Disease, History of pressure wounds, Neuropathy Photos Wound Measurements Length: (cm) 7.5 Width: (cm) 5.5 Depth: (cm) 0.2 Area: (cm) 32.398 Volume: (cm) 6.48 % Reduction in Area: -587.6% % Reduction in Volume: -587.9% Epithelialization: Small (1-33%) Wound Description Classification: Grade 3 Wound Margin: Flat and Intact Exudate Amount: Medium Exudate Type: Serous  Exudate Color: amber Foul Odor After Cleansing: No Slough/Fibrino Yes Wound Bed Granulation Amount: Large (67-100%) Exposed Structure Granulation Quality: Red, Pink Fascia Exposed: No Necrotic Amount: Small (1-33%) Fat Layer (Subcutaneous Tissue) Exposed: Yes Necrotic Quality: Adherent Slough Tendon Exposed: No Muscle Exposed: No Joint Exposed: No Bone Exposed: No Treatment Notes Wound #12 (Foot) Wound Laterality: Plantar, Left, Medial Cleanser Soap and Water Discharge Instruction: Gently cleanse wound with antibacterial soap, rinse and pat dry prior to dressing wounds [name], [name] ([id number]) Peri-Wound Care Topical Vitamin AandD Ointment, 4(oz) tube Discharge Instruction: apply to red, broken down area keystone gel Discharge Instruction: apply very thin layer to wound Primary Dressing Aquacel Extra Hydrofiber Dressing, 4x5 (in/in) Discharge Instruction: placed over keystone gel Secondary  Dressing ABD Pad 5x9 (in/in) Discharge Instruction: Cover with ABD pad Secured With Medipore Tape - 48M Medipore H Soft Cloth Surgical Tape, 2x2 (in/yd) Kerlix Roll Sterile or Non-Sterile 6-ply 4.5x4 (yd/yd) Discharge Instruction: Apply Kerlix as directed Compression Wrap Compression Stockings Add-Ons Electronic Signature(s) Signed: 06/15/2022 3:38:29 PM By: Massie Kluver Signed: 06/16/2022 4:19:23 PM By: Carlene Coria RN Entered By: Massie Kluver on 06/15/2022 15:31:39 [name], [name] ([id number]) -------------------------------------------------------------------------------- Vitals Details Patient Name: [name] A. Date of Service: 06/15/2022 3:00 PM Medical Record Number: [id number] Patient Account Number: [id number] Date of Birth/Sex: [birth year] (46 y.o. F) Treating RN: Carlene Coria Primary Care Makira Holleman: Raelene Bott Other Clinician: Massie Kluver Referring Tejal Monroy: Raelene Bott Treating Sheryl Towell/Extender: Yaakov Guthrie in Treatment: 13 Vital Signs Time Taken: 15:09 Temperature (F): 98.1 Pulse (bpm): 55 Respiratory Rate (breaths/min): 18 Blood Pressure (mmHg): 108/57 Reference Range: 80 - 120 mg / dl Electronic Signature(s) Signed: 06/15/2022 3:38:29 PM By: Massie Kluver Entered By: Massie Kluver on 06/15/2022 15:13:24

## 2022-06-22 ENCOUNTER — Encounter (HOSPITAL_BASED_OUTPATIENT_CLINIC_OR_DEPARTMENT_OTHER): Payer: Medicaid Other | Admitting: Internal Medicine

## 2022-06-22 DIAGNOSIS — L97512 Non-pressure chronic ulcer of other part of right foot with fat layer exposed: Secondary | ICD-10-CM

## 2022-06-22 DIAGNOSIS — L97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ity: Secondary | ICD-10-CM | POA: Diagnosis not present

## 2022-06-22 DIAGNOSIS — E11621 Type 2 diabetes mellitus with foot ulcer: Secondary | ICD-10-CM | POA: Diagnosis not present

## 2022-06-24 NOTE — Progress Notes (Signed)
SHARESA, KEMP ([id number]) Visit Report for 06/22/2022 Chief Complaint Document Details Patient Name: [name], [name] Date of Service: 06/22/2022 1:30 PM Medical Record Number: [id number] Patient Account Number: [id number] Date of Birth/Sex: October 09, 1976 (46 y.o. Female) Treating RN: Cornell Barman Primary Care Provider: Raelene Bott Other Clinician: Massie Kluver Referring Provider: Raelene Bott Treating Provider/Extender: Yaakov Guthrie in Treatment: 14 Information Obtained from: Patient Chief Complaint 03/19/2021; patient referred by Dr. Luana Shu who has been looking after her left foot for quite a period of time for review of a nonhealing area in the left midfoot 03/12/2022; bilateral feet wounds and right lower extremity wound. Electronic Signature(s) Signed: 06/22/2022 3:18:05 PM By: Kalman Shan DO Entered By: Kalman Shan on 06/22/2022 15:01:58 [name] ([id number]) -------------------------------------------------------------------------------- Debridement Details Patient Name: Heather Dear A. Date of Service: 06/22/2022 1:30 PM Medical Record Number: [id number] Patient Account Number: [id number] Date of Birth/Sex: February 12, 1976 (46 y.o. Female) Treating RN: Cornell Barman Primary Care Provider: Raelene Bott Other Clinician: Massie Kluver Referring Provider: Raelene Bott Treating Provider/Extender: Yaakov Guthrie in Treatment: 14 Debridement Performed for Wound #11 Right,Plantar Foot Assessment: Performed By: Physician Kalman Shan, MD Debridement Type: Debridement Level of Consciousness (Pre- Awake and Alert procedure): Pre-procedure Verification/Time Out Yes - 14:22 Taken: Start Time: 14:22 Total Area Debrided (L x W): 0.5 (cm) x 0.2 (cm) = 0.1 (cm) Tissue and other material Viable, Non-Viable, Callus, Slough, Subcutaneous, Slough debrided: Level: Skin/Subcutaneous Tissue Debridement Description: Excisional Instrument:  Curette Bleeding: Minimum Hemostasis Achieved: Pressure End Time: 14:25 Response to Treatment: Procedure was tolerated well Level of Consciousness (Post- Awake and Alert procedure): Post Debridement Measurements of Total Wound Length: (cm) 0.5 Width: (cm) 0.2 Depth: (cm) 0.6 Volume: (cm) 0.047 Character of Wound/Ulcer Post Debridement: Stable Post Procedure Diagnosis Same as Pre-procedure Electronic Signature(s) Signed: 06/22/2022 3:18:05 PM By: Kalman Shan DO Signed: 06/23/2022 4:44:47 PM By: Massie Kluver Signed: 06/23/2022 6:20:35 PM By: Gretta Cool, BSN, RN, CWS, [name] RN, BSN Entered By: Massie Kluver on 06/22/2022 14:25:39 [name], [name] ([id number]) -------------------------------------------------------------------------------- Debridement Details Patient Name: Heather Dear A. Date of Service: 06/22/2022 1:30 PM Medical Record Number: [id number] Patient Account Number: [id number] Date of Birth/Sex: 12-23-1975 (46 y.o. Female) Treating RN: Cornell Barman Primary Care Provider: Raelene Bott Other Clinician: Massie Kluver Referring Provider: Raelene Bott Treating Provider/Extender: Yaakov Guthrie in Treatment: 14 Debridement Performed for Wound #12 Left,Medial,Plantar Foot Assessment: Performed By: Physician Kalman Shan, MD Debridement Type: Debridement Severity of Tissue Pre Debridement: Fat layer exposed Level of Consciousness (Pre- Awake and Alert procedure): Pre-procedure Verification/Time Out Yes - 14:26 Taken: Start Time: 14:26 Total Area Debrided (L x W): 3.5 (cm) x 1.8 (cm) = 6.3 (cm) Tissue and other material Viable, Non-Viable, Callus, Slough, Subcutaneous, Slough debrided: Level: Skin/Subcutaneous Tissue Debridement Description: Excisional Instrument: Curette Bleeding: Minimum Hemostasis Achieved: Pressure End Time: 14:30 Response to Treatment: Procedure was tolerated well Level of Consciousness (Post- Awake and  Alert procedure): Post Debridement Measurements of Total Wound Length: (cm) 3 Width: (cm) 1.5 Depth: (cm) 0.2 Volume: (cm) 0.707 Character of Wound/Ulcer Post Debridement: Stable Severity of Tissue Post Debridement: Fat layer exposed Post Procedure Diagnosis Same as Pre-procedure Electronic Signature(s) Signed: 06/22/2022 3:18:05 PM By: Kalman Shan DO Signed: 06/23/2022 4:44:47 PM By: Massie Kluver Signed: 06/23/2022 6:20:35 PM By: Gretta Cool, BSN, RN, CWS, [name] RN, BSN Entered By: Massie Kluver on 06/22/2022 14:47:52 [name], [name] ([id number]) -------------------------------------------------------------------------------- HPI Details Patient Name: Heather Dear A. Date of Service: 06/22/2022 1:30 PM Medical Record Number: [id number] Patient Account Number: [id number] Date  of Birth/Sex: 03/27/1976 (46 y.o. Female) Treating RN: Cornell Barman Primary Care Provider: Raelene Bott Other Clinician: Massie Kluver Referring Provider: Raelene Bott Treating Provider/Extender: Yaakov Guthrie in Treatment: 14 History of Present Illness HPI Description: 01/18/18-She is here for initial evaluation of the left great toe ulcer. She is a poor historian in regards to timeframe in detail. She states approximately 4 weeks ago she lacerated her toe on something in the house. She followed up with her primary care who placed her on Bactrim and ultimately a second dose of Bactrim prior to coming to wound clinic. She states she has been treating the toe with peroxide, Betadine and a Band-Aid. She did not check her blood sugar this morning but checked it yesterday morning it was 327; she is unaware of a recent A1c and there are no current records. She saw Dr. she would've orthopedics last week for an old injury to the left ankle, she states he did not see her toe, nor did she bring it to his attention. She smokes approximately 1 pack cigarettes a day. Her social situation is concerning, she  arrives this morning with her mother who appears extremely intoxicated/under the influence; her mother was asked to leave the room and be monitored by the patient's grandmother. The patient's aunt then accompanied the patient and the room throughout the rest of the appointment. We had a lengthy discussion regarding the deleterious effects of uncontrolled hyperglycemia and smoking as it relates to wound healing and overall health. She was strongly encouraged to decrease her smoking and get her diabetes under better control. She states she is currently on a diet and has cut down her [facility] consumption. The left toe is erythematous, macerated and slightly edematous with malodor present. The edema in her left foot is below her baseline, there is no erythema streaking. We will treat her with Santyl, doxycycline; we have ordered and xray, culture and provided a Peg assist surgical shoe and cultured the wound. 01/25/18-She is here in follow-up evaluation for a left great toe ulcer and presents with an abscess to her suprapubic area. She states her blood sugars remain elevated, feeling "sick" and if levels are below 250, but she is trying. She has made no attempt to decrease her smoking stating that we "can't take away her food in her cigarettes". She has been compliant with offloading using the PEG assist you. She is using Santyl daily. the culture obtained last week grew staph aureus and Enterococcus faecalis; continues on the doxycycline and Augmentin was added on Monday. The suprapubic area has erythema, no femoral variation, purple discoloration, minimal induration, was accessed with a cotton tip applicator with sanguinopurulent drainage, this was cultured, I suspect the current antibiotic treatment will cover and we will not add anything to her current treatment plan. She was advised to go to urgent care or ER with any change in redness, induration or fever. 02/01/18-She is here in follow-up  evaluation for left great toe ulcers and a new abdominal abscess from last week. She was able to use packing until earlier this week, where she "forgot it was there". She states she was feeling ill with GI symptoms last week and was not taking her antibiotic. She states her glucose levels have been predominantly less than 200, with occasional levels between 200-250. She thinks this was contributing to her GI symptoms as they have resolved without intervention. There continues to be significant laceration to left toe, otherwise it clinically looks stable/improved. There is now less  superficial opening to the lateral aspect of the great toe that was residual blister. We will transition to [facility] to all wounds, she will continue her Augmentin. If there is no change or deterioration next week for reculture. 02/08/18-She is here in follow-up evaluation for left great toe ulcer and abdominal ulcer. There is an improvement in both wounds. She has been wrapping her left toe with coban, not by our direction, which has created an area of discoloration to the medial aspect; she has been advised to NOT use coban secondary to her neuropathy. She states her glucose levels have been high over this last week ranging from 200-350, she continues to smoke. She admits to being less compliant with her offloading shoe. We will continue with same treatment plan and she will follow-up next week. 02/15/18-She is here in follow-up evaluation for left great toe ulcer and abdominal ulcer. The abdominal ulcer is epithelialized. The left great toe ulcer is improved and all injury from last week using the Coban wrap is resolved, the lateral ulcer is healed. She admits to noncompliance with wearing offloading shoe and admits to glucose levels being greater than 300 most of the week. She continues to smoke and expresses no desire to quit. There is one area medially that probes deeper than it has historically, erythema to the toe  and dorsal foot has consistently waxed and waned. There is no overt signs of cellulitis or infection but we will culture the wound for any occult infection given the new area of depth and erythema. We will hold off on sensitivities for initiation of antibiotic therapy. 02/22/18-She is here in follow up evaluation for left great toe ulcer. There is overall significant improvement in both wound appearance, erythema and edema with changes made last week. She was not initiated on antibiotic therapy. Culture obtained last week showed oxacillin sensitive staph aureus, sensitive to clindamycin. Clindamycin has been called into the pharmacy but she has been instructed to hold off on initiation secondary to overall clinical improvement and her history of antibiotic intolerance. She has been instructed to contact the clinic with any noted changes/deterioration and the wound, erythema, edema and/or pain. She will follow-up next week. She continues to smoke and her glucose levels remain elevated >250; she admits to compliance with offloading shoe 03/01/18 on evaluation today patient appears to be doing fairly well in regard to her left first toe ulcer. She has been tolerating the dressing changes with the [facility] Dressing without complication and overall this has definitely showed signs of improvement according to records as well is what the patient tells me today. I'm very pleased in that regard. She is having no pain today 03/08/18 She is here for follow up evaluation of a left great toe ulcer. She remains non-compliant with glucose control and smoking cessation; glucose levels consistently >200. She states that she got new shoe inserts/peg assist. She admits to compliance with offloading. Since my last evaluation there is significant improvement. We will switch to prisma at this time and she will follow up next week. She is noted to be tachycardic at this appointment, heart rate 120s; she has a history of  heart rate 70-130 according to our records. She admits to extreme agitation r/t personal issues; she was advised to monitor her heartrate and contact her physician if it does not return to a more normal range (<100). She takes cardizem twice daily. 03/15/18-She is here in follow-up evaluation for left great toe ulcer. She remains noncompliant with glucose  control and smoking cessation. She admits to compliance with wearing offloading shoe. The ulcer is improved/stable and we will continue with the same treatment plan and she will follow-up next week 03/22/18-She is here for evaluation for left great toe ulcer. There continues to be significant improvement despite recurrent hyperglycemia (over 500 yesterday) and she continues to smoke. She has been compliant with offloading and we will continue with same treatment plan and she will follow-up next week. 03/29/18-She is here for evaluation for left great toe ulcer. Despite continuing to smoke and uncontrolled diabetes she continues to improve. She is compliant with offloading shoe. We will continue with the same treatment plan and she will follow-up next week 04/05/18- She is here in follow up evaluation for a left great toe ulcer; she presents with small pustule to left fifth toe (resembles ant bite). She admits to compliance with wearing offloading shoe; continues to smoke or have uncontrolled blood glucose control. There is more callus than usual with evidence of bleeding; she denies known trauma. 04/12/18-She is here for evaluation of left great toe ulcer. Despite noncompliance with glycemic control and smoking she continues to make Oboyle, Mariah A. ([id number]) improvement. She continues to wear offloading shoe. The pustule, that was identified last week, to the left fifth toe is resolved. She will follow-up in 2 weeks 05/03/18-she is seen in follow-up evaluation for a left great toe ulcer. She is compliant with offloading, otherwise noncompliant with  glycemic control and smoking. She has plateaued and there is minimal improvement noted. We will transition to [facility], replaced the insert to her surgical shoe and she will follow-up in one week 05/10/18- She is here in follow up evaluation for a left great toe ulcer. It appears stable despite measurement change. We will continue with same treatment plan and follow up next week. 05/24/18-She is seen in follow-up evaluation for a left great toe ulcer. She remains compliant with offloading, has made significant improvement in her diet, decreasing the amount of sugar/soda. She said her recent A1c was 10.9 which is lower than. She did see a diabetic nutritionist/educator yesterday. She continues to smoke. We will continue with the same treatment plan and she'll follow-up next week. 05/31/18- She is seen in follow-up evaluation for left great toe ulcer. She continues to remain compliant with offloading, continues to make improvement in her diet, increasing her water and decreasing the amount of sugar/soda. She does continue to smoke with no desire to quit. We will apply Prisma to the depth and Hydrofera Blue over. We have not received insurance authorization for oasis. She will follow up next week. 06/07/18-She is seen in follow-up evaluation for left great toe ulcer. It has stalled according to today's measurements although base appears stable. She says she saw a diabetic educator yesterday; her average blood sugars are less than 300 which is an improvement for her. She continues to smoke and states "that's my next step" She continues with water over soda. We will order for xray, culture and reinstate ace wrap compression prior to placing apligraf for next week. She is voicing no complaints or concerns. Her dressing will change to iodoflex over the next week in preparation for apligraf. 06/14/18-She is seen in follow-up evaluation for left great toe ulcer. Plain film x-ray performed last week was  negative for osteomyelitis. Wound culture obtained last week grew strep B and OSSA; she is initiated on keflex and cefdinir today; there is erythema to the toe which could be from ace wrap compression,  she has a history of wrapping too tight and has has been encouraged to maintain ace wraps that we place today. We will hold off on application of apligraf today, will apply next week after antibiotic therapy has been initiated. She admits today that she has resumed taking a shower with her foot/toe submerged in water, she has been reminded to keep foot/toe out of the bath water. She will be seen in follow up next week 06/21/18-she is seen in follow-up evaluation for left great toe ulcer. She is tolerating antibiotic therapy with no GI disturbance. The wound is stable. Apligraf was applied today. She has been decreasing her smoking, only had 4 cigarettes yesterday and 1 today. She continues being more compliant in diabetic diet. She will follow-up next week for evaluation of site, if stable will remove at 2 weeks. 06/28/18- She is here in follow up evalution. Apligraf was placed last week, she states the dressing fell off on Tuesday and she was dressing with hydrofera blue. She is healed and will be discharged from the clinic today. She has been instructed to continue with smoking cessation, continue monitoring glucose levels, offloading for an additional 4 weeks and continue with hydrofera blue for additional two weeks for any possible microscopic opening. Readmission: 08/07/18 on evaluation today patient presents for reevaluation concerning the ulcer of her right great toe. She was previously discharged on 06/28/18 healed. Nonetheless she states that this began to show signs of drainage she subsequently went to her primary care provider. Subsequently an x-ray was performed on 08/01/18 which was negative. The patient was also placed on antibiotics at that time. Fortunately they should have been effective  for the infection. Nonetheless she's been experiencing some improvement but still has a lot of drainage coming from the wound itself. 08/14/18 on evaluation today patient's wound actually does show signs of improvement in regard to the erythema at this point. She has completed the antibiotics. With that being said we did discuss the possibility of placing her in a total contact cast as of today although I think that I may want to give this just a little bit more time to ensure nothing recurrence as far as her infection is concerned. I do not want to put in the cast and risk infection at that time if things are not completely resolved. With that being said she is gonna require some debridement today. 08/21/18 on evaluation today patient actually appears to be doing okay in regard to her toe ulcer. She's been tolerating the dressing changes without complication. With that being said it does appear that she is ready and in fact I think it's appropriate for [place] to go ahead and initiate the total contact cast today. Nonetheless she will require some sharp debridement to prepare the wound for application. Overall I feel like things have been progressing well but we do need to do something to get this to close more readily. 08/24/18 patient seen today for reevaluation after having had the total contact cast applied on Tuesday. She seems to have done very well the wound appears to be doing great and overall I'm pleased with the progress that she's made. There were no abnormal areas of rubbing from the cast on her lower extremity. 08/30/18 on evaluation today patient actually appears to be completely healed in regard to her plantar toe ulcer. She tells me at this point she's been having a lot of issues with the cast. She almost fell a couple of times the state shall the  step of her dog a couple times as well. This is been a very frustrating process for her other nonetheless she has completely healed the wound  which is excellent news. Overall there does not appear to be the evidence of infection at this time which is great news. 09/11/18 evaluation today patient presents for follow-up concerning her great toe ulcer on the left which has unfortunately reopened since I last saw her which was only a couple of weeks ago. Unfortunately she was not able to get in to get the shoe and potentially the AFO that's gonna be necessary due to her left foot drop. She continues with offloading shoe but this is not enough to prevent her from reopening it appears. When we last had her in the total contact cast she did well from a healing standpoint but unfortunately the wound reopened as soon as she came out of the cast within just a couple of weeks. 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 This is also part of the reason why she falls on a regular basis. I just do not believe that is gonna be safe for [place] to reinitiate the total contact cast as last time we had this on she fell 3 times one week which is definitely not normal for her. 09/18/18 upon evaluation today the patient actually appears to be doing about the same in regard to her toe ulcer. She did not contact [facility] as I asked her to even though I had given her the prescription. In fact she actually states that she has no idea where the prescription is. She did apparently call [facility] and they told her that all she needed to do was bring the prescription in order to be able to be seen and work on getting the AFO for her left foot. With all that being said she still does not have an appointment and I'm not sure were things stand that regard. I will give her a new prescription today in order to contact them to get this set up. 09/25/18 on evaluation today patient actually  appears to be doing about the same in regard to her toes ulcer. She does have a small areas which seems to have a lot of callous buildup around the edge of the wound which is going to need sharp debridement today. She still is waiting to be scheduled for evaluation with [facility] for possibility of an AFO. She states there supposed to call her tomorrow to get this set up. Unfortunately it does appear that her foot specifically the toe area is showing signs of erythema. There does not appear to be any systemic infection which is in these good news. [name], [name] ([id number]) 10/02/18 on evaluation today patient actually appears to be doing about the same in regard to her toe ulcer. This really has not done too well although it's not significantly larger it's also not significantly smaller. She has been tolerating the dressing changes without complication. She actually has her appointment with [facility] and [place] tomorrow to hopefully be measured for obtaining and AFO splint. I think this would be helpful preventing this from reoccurring. We had contemplated starting the cast this week although to be honest I am reluctant to do that as she's been having nausea, vomiting, and seizure  activity over the past three days. She has a history of seizures and have been told is nothing that can be done for these. With that being said I do believe that along with the seizures have the nausea vomiting which upon further questioning doesn't seem to be the normal for her and makes me concerned for the possibility of infection or something else going on. I discussed this with the patient and her mother during the office visit today. I do not feel the wound is effective but maybe something else. The responses this was "this just happens to her at times and we don't know why". They did not seem to be interested in going to the hospital to have this checked out further. 10/09/18 on evaluation today patient presents  for follow-up concerning her ongoing toe ulcer. She has been tolerating the dressing changes without complication. Fortunately there does not appear to be any evidence of infection which is great news however I do think that the patient would benefit from going ahead for with the total contact cast. She's actually in a wheelchair today she tells me that she will use her walker if we initiate the cast. I was very specific about the fact that if we were gonna do the cast I wanted to make sure that she was using the walker in order to prevent any falls. She tells me she does not have stairs that she has to traverse on a regular basis at her home. She has not had any seizures since last week again that something that happens to her often she tells me she did talk to [facility] [employer] and they said that it may take up to three weeks to get the brace approved for her. Hopefully that will not take that long but nonetheless in the meantime I do think the cast could be of benefit. 10/12/18 on evaluation today patient appears to be doing rather well in regard to her toe ulcer. It's just been a few days and already this is significantly improved both as far as overall appearance and size. Fortunately there's no sign of infection. She is here for her first obligatory cast change. 10/19/18 Seen today for follow up and management of left great toe ulcer. Wound continues to show improvement. Noted small open area with seroussang drainage with palpation. Denies any increased pain or recent fevers during visit. She will continue calcium alginate with offloading shoe. Denies any questions or concerns during visit. 10/26/18 on evaluation today patient appears to be doing about the same as when I last saw her in regard to her wound bed. Fortunately there does not appear to be any signs of infection. Unfortunately she continues to have a breakdown in regard to the toe region any time that she is not in the cast. It takes almost no  time at all for this to happen. Nonetheless she still has not heard anything from the brace being made by [employer] as to when exactly this will be available to her. Fortunately there is no signs of infection at this time. 10/30/18 on evaluation today patient presents for application of the total contact cast as we just received him this morning. Fortunately we are gonna be able to apply this to her today which is great news. She continues to have no significant pain which is good news. Overall I do feel like things have been improving while she was the cast is when she doesn't have a cast that things get worse. She still has not  really heard anything from [place] regarding her brace. 11/02/18 upon evaluation today patient's wound already appears to be doing significantly better which is good news. Fortunately there does not appear to be any signs of infection also good news. Overall I do think the total contact cast as before is helping to heal this area unfortunately it's just not gonna likely keep the area closed and healed without her getting her brace at least. Again the foot drop is a significant issue for her. 11/09/18 on evaluation today patient appears to be doing excellent in regard to her toe ulcer which in fact is completely healed. Fortunately we finally got the situation squared away with the paperwork which was needed to proceed with getting her brace approved by Medicaid. I have filled that out unfortunately that information has been sent to the orthopedic office that I worked at 2 1/2 years ago and not tired Current wound care measures. Fortunately she seems to be doing very well at this time. 11/23/18 on evaluation today patient appears to be doing More Poorly Compared to Last Time I Saw Her. At [name] She Had Completely Healed. Currently she is continuing to have issues with reopening. She states that she just found out that the brace was approved through Medicaid now she just has to  go get measured in order to have this fitted for her and then made. Subsequently she does not have an appointment for this yet that is going to complicate things we obviously cannot put her back in the cast if we do not have everything measured because they're not gonna be able to measure her foot while she is in the cast. 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 Obviously this is unfortunate and does have me somewhat worried as well. 11/30/18 on evaluation today patient's toe ulcer actually appears to be doing fairly well. The good news is she will be getting her brace in the shoes next week on Wednesday. Hopefully we will be able to get this to heal without having to go back in the cast however she may need the cast in order to get the wound completely heal and then go from there. Fortunately there's no signs of infection at this time. 12/07/18 on evaluation today patient fortunately did receive her brace and she states she could tell this definitely makes her walk better. With that being said she's been having issues with her toe where she noticed yesterday there was a lot of tissue that was loosing off this appears to be much larger than what it was previous. She also states that her leg has been read putting much across the top of her foot just about the ankle although this seems to be receiving somewhat. The total area is still red and appears to be someone infected as best I can tell. She is previously taken Bactrim and that may be a good option for her today as well. We are gonna see what I wound culture shows as well and I think that this is definitely appropriate. With that being said outside of the culture I still need to initiate something in the interim and that's what I'm gonna go ahead and select Bactrim is a good option for her. 12/14/18 on evaluation today patient appears to be doing  better in regard to her left great toe ulcer as compared to last week's evaluation. There's  still some erythema although this is significantly improved which is excellent news. Overall I do believe that she is making good progress is still gonna take some time before she is where I would like her to be from the standpoint of being able to place her back into the total contact cast. Hopefully we will be where we need to be by next week. 12/21/18 on evaluation today patient actually appears to be doing poorly in regard to her toe ulcer. She's been tolerating the dressing changes without complication. Fortunately there's no signs of systemic infection although she does have a lot of drainage from the toe ulcer and this does seem to be causing some issues at this point. She does have erythema on the distal portion of her toe that appears to be likely cellulitis. 12/28/18 on evaluation today patient actually appears to be doing a little better in my pinion in regard to her toe ulcer. With that being said she still does have some evidence of infection at this time and for her culture she had both E. coli as well as enterococcus as organisms noted on evaluation. For that reason I think that though the Keflex likely has treated the E. coli rather well this has really done nothing for the enterococcus. We are going to have to initiate treatment for this specifically. ELIYANNA, AULT A. ([id number]) 01/04/19 on evaluation today patient's toe actually appears to be doing better from the standpoint of infection. She currently would like to see about putting the cash back on I think that this is appropriate as long as she takes care of it and keeps it from getting wet. She is gonna have some drainage we can definitely pass this up with Drawtex and alginate to try to prevent as much drainage as possible from causing the problems. With that being said I do want to at least try her with the cast between now and Tuesday. If  there any issues we can't continue to use it then I will discontinue the use of the cast at that point. 01/08/19 on evaluation today patient actually appears to be doing very well as far as her foot ulcer specifically the great toe on the left is concerned. She did have an area of rubbing on the medial aspect of her left ankle which again is from the cast. Fortunately there's no signs of infection at this point in this appears to be a very slight skin breakdown. The patient tells me she felt it rubbing but didn't think it was that bad. Fortunately there is no signs of active infection at this time which is good news. No fevers, chills, nausea, or vomiting noted at this time. 01/15/19 on evaluation today patient actually appears to be doing well in regard to her toe ulcer. Again as previous she seems to do well and she has the cast on which indicates to me that during the time she doesn't have a cast on she's putting way too much pressure on this region. Obviously I think that's gonna be an issue as with the current national emergency concerning the Covid-19 Virus it has been recommended that we discontinue the use of total contact casting by the chief medical officer of our company, Dr. Simona Huh. The reasoning is that if a patient becomes sick and cannot come into have the cast removed they could not just leave this on for an additional two weeks. Obviously the hospitals also do not want to receive patient's who are sick into the emergency  department to potentially contaminate the region and spread the Covid-19 Virus among other sick individuals within the hospital system. Therefore at this point we are suspending the use of total contact cast until the current emergency subsides. This was all discussed with the patient today as well. 01/22/19 on evaluation today patient's wound on her left great toe appears to be doing slightly worse than previously noted last week. She tells me that she has been on this  quite a bit in fact she tells me she's been awake for 38 straight hours. This is due to the fact that she's having to care for grandparents because nobody else will. She has been taking care of them for five the last seven days since I've seen her they both have dementia his is from a stroke and her grandmother's was progressive. Nonetheless she states even her mom who knows her condition and situation has only help two of those days to take care of them she's been taking care of the rest. Fortunately there does not appear to be any signs of active infection in regard to her toe at this point although obviously it doesn't look as good as it did previous. I think this is directly related to her not taking off the pressure and friction by way of taking things easy. Though I completely understand what's going on. 01/29/19 on evaluation today patient's tools are actually appears to be showing some signs of improvement today compared to last week's evaluation as far as not necessarily the overall size of the wound but the fact that she has some new skin growth in between the two ends of the wound opening. Overall I feel like she has done well she states that she had a family member give her what sounds to be a CAM walker boot which has been helpful as well. 02/05/19 on evaluation today patient's wound bed actually appears to be doing significantly better in regard to her overall appearance of the size of the wound. With that being said she is still having an issue with offloading efficiently enough to get this to close. Apparently there is some signs of infection at this point as well unfortunately. Previously she's done well of Augmentin I really do not see anything that needs to be culture currently but there theme and cellulitis of the foot that I'm seeing I'm gonna go ahead and place her on an antibiotic today to try to help clear this up. 02/12/2019 on evaluation today patient actually appears to be doing  poorly in regard to her overall wound status. She tells me she has been using her offloading shoe but actually comes in today wearing her tennis shoe with the AFO brace. Again as I previously discussed with her this is really not sufficient to allow the area to heal appropriately. Nonetheless she continues to be somewhat noncompliant and I do wonder based on what she has told my nurse in the past as to whether or not a good portion of this noncompliance may be recreational drug and alcohol related. She has had a history of heroin overdose and this was fairly recently in the past couple of months that have been seeing her. Nonetheless overall I feel like her wound looks significantly worse today compared to what it was previous. She still has significant erythema despite the Augmentin I am not sure that this is an appropriate medication for her infection I am also concerned that the infection may have gone down into her bone. 02/19/19 on  evaluation today patient actually appears to be doing about the same in regard to her toe ulcer. Unfortunately she continues to show signs of bone exposure and infection at this point. There does not appear to be any evidence of worsening of the infection but I'm also not really sure that it's getting significantly better. She is on the Augmentin which should be sufficient for the Staphylococcus aureus infection that she has at this point. With that being said she may need IV antibiotics to more appropriately treat this. We did have a discussion today about hyperbaric option therapy. 02/28/19 on evaluation today patient actually appears to be doing much worse in regard to the wound on her left great toe as compared to even my previous evaluation last week. Unfortunately this seems to be training in a pretty poor direction. Her toe was actually now starting to angle laterally and I can actually see the entire joint area of the proximal portion of the digit where is the  distal portion of the digit again is no longer even in contact with the joint line. Unfortunately there's a lot more necrotic tissue around the edge and the toe appears to be showing signs of becoming gangrenous in my pinion. I'm very concerned about were things stand at this point. She did see infectious disease and they are planning to send in a prescription for Sivextro for her and apparently this has been approved. With that being said I don't think she should avoid taking this but at the same time I'm not sure that it's gonna be sufficient to save her toe at this point. 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 03/05/19 on evaluation today patient actually appears to be doing quite well in regard to her toe all things considering. She still has bone exposed but there appears to be much less your thing on overall the appearance of the wound and the toe itself is dramatically improved. She still does have some issues currently obviously with infection she did see vascular as well and there concerned that her blood flow to the toad. For that reason they are setting up for an angiogram next week. 03/14/19 on evaluation today patient appears to be doing very poor in regard to her toe and specifically in regard to the ulceration and the fact that she's starting to notice the toe was leaning even more towards the lateral aspect and the complete joint is visible on the proximal aspect of the joint. Nonetheless she's also noted a significant odor and the tip of the toe is turning more dark and necrotic appearing. Overall I think she is getting worse not better as far as this is concerned. For that reason I am recommending at this point that she likely needs to be seen for  likely amputation. READMISSION 03/19/2021 This is a patient that we cared for in this clinic for a prolonged period of time in 2019 and 2020 with a left foot and left first toe wound. 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 ([id number]) 04/09/20 by Dr. Luana Shu. In December 2021 she had an ulcer on her right great toe as well as the fourth and  fifth toes. 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 She also had an angiogram at that time and underwent angioplasty of the right anterior tibial artery. In any case she claims that the wound on the right foot is closed I did not look at this today which was probably an oversight although I think that should be done next week. After her surgery she developed a dehiscence but I do not see any follow-up of this. According to Dr. Deborra Medina last review that she was out of the area being cared for by another physician but recently came back to his attention. 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 A culture of this area showed E. coli apparently before she came back to see Dr. Luana Shu she was supposed to be receiving antibiotics but she did not really take them. Nor is she offloading this area at all. Finally her last hemoglobin A1c listed in epic was in March 2022 at 14.1 she says things are a lot better since then although I am not sure. 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 She had this wound at the time. During this admission she had E. coli sepsis a TEE was negative. Past medical history is extensive and includes type 2 diabetes with peripheral neuropathy cardiomyopathy with an ejection fraction of 33%, hypertension, hyperlipidemia chronic renal failure stage III history of substance abuse with cocaine  although she claims to be clean now verified by her mother. She is still a heavy cigarette smoker. She has a history of bipolar disorder seizure disorder ABI in our clinic was 1.05 6/1; left midfoot in the setting of a TMA done previously. Round circular wound with a "knuckle" of protruding tissue. The problem is that the knuckle was not attached to any of the surrounding granulation and this probed proximally widely I removed a large portion of this tissue. This wound goes with considerable undermining laterally. I do not feel any bone there was no purulence but this is a deep wound. 6/8; in spite of the debridement I did last week. She arrives with a wound looking exactly the same. A protruding "knuckle" of tissue nonadherent to most of the surrounding tissue. There is considerable depth around this from 6-12 o'clock at 2.7 cm and undermining of 1 cm. This does not look overtly infected and the x-ray I did last week was negative for any osseous abnormalities. We have been using silver collagen 6/15; deep tissue culture I did last week showed moderate staph aureus and moderate Pseudomonas. This will definitely require prolonged antibiotic therapy. The pathology on the protuberant area was negative for malignancy fungus etc. 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 We have been using silver collagen. I am going to be prescribing Levaquin for 2 weeks. Her CT scan of the foot is down for 7/5 6/22; CT scan of the foot on 7 5. She says she has hardware in the left leg from her previous fracture. She is on the Levaquin for the deep tissue culture I did that showed methicillin sensitive staph aureus and Pseudomonas. I gave her a 2-week supply and she will have another week. She arrives in clinic today with the same protuberant tissue however this is nonadherent to the tissue surrounding it. I am really at a loss to explain this unless there is  underlying deep tissue infection 6/29; patient presents  for 1 week follow-up. She has been using collagen to the wound bed. She reports taking her antibiotics as prescribed.She has no complaints or issues today. She denies signs of infection. 7/6; patient presents for one week followup. She has been using collagen to the wound bed. She states she is taking Levaquin however at times she is not able to keep it down. She denies signs of infection. 7/13; patient presents for 1 week follow-up. She has been using silver alginate to the wound bed. She still has nausea when taking her antibiotics. She denies signs of infection. 7/20; patient presents for 1 week follow-up. She has been using silver alginate with gentamicin cream to the wound bed. She denies any issues and has no complaints today. She denies signs of infection. 7/27; patient presents for 1 week follow-up. She continues to use silver alginate with gentamicin cream to the wound bed. She reports starting her antibiotics. She has no issues or complaints. Overall she reports stability to the wound. 8/3; patient presents for 1 week follow-up. She has been using silver alginate with gentamicin cream to the wound bed. She reports completing all antibiotics. She has no issues or complaints today. She denies signs of infection. 8/17; patient presents for 2-week follow-up. He is to use silver alginate to the wound bed. She has no issues or complaints today. She denies signs of infection. She reports her pain has improved in her foot since last clinic visit 8/24; patient presents for 1 week follow-up. She continues to use silver alginate to the wound bed. She has no issues or complaints. She denies signs of infection. Pain is stable. 9/7; patient presents for follow-up. She missed her last week appointment due to feeling ill. She continues to use silver alginate. She has a new wound to the right lower extremity that is covered in eschar. She states It  occurred over the past week and has no idea how it started. She currently denies signs of infection. 9/14; patient presents for follow-up. To the left foot wound she has been using gentamicin cream and silver alginate. To the right lower extremity wound she has been keeping this covered and has not obtain Santyl. 9/21; patient presents for follow-up. She reports using gentamicin cream and silver alginate to the left foot and Santyl to the right lower extremity wound. She has no issues or complaints today. She denies signs of infection. 9/28; patient presents for follow-up. She reports a new wound to her right heel. She states this occurred a few days ago and is progressively gotten worse. She has been trying to clean the area with a Q-tip and Santyl. She reports stability in the other 2 wounds. She has been using gentamicin cream and silver alginate to the left foot and Santyl to the right lower extremity wound. 10/12; patient presents for follow-up. She reports improvement to the wound beds. She is seeing vein and vascular to discuss the potential of a left BKA. She states they are going to do an arteriogram. She continues to use silver alginate with dressing changes to her wounds. 11/2; patient presents for follow-up. She states she has not been doing dressing changes to the wound beds. She states she is not able to offload the areas. She reports chronic pain to her left foot wound. 11/9; patient presents for follow-up. She came in with only socks on. She states she forgot to put on shoes. It is unclear if she is doing any dressing changes. She currently denies systemic signs of  infection. 11/16; patient presents for follow-up. She came again only with socks on. She states she does not wear shoes ever. It is unclear if she does dressing changes. She currently denies systemic signs of infection. 11/23; patient presents for follow-up. She wore her shoes today. It still unclear exactly what dressing  she is using for each wound but she did states she obtained Dakin's solution and has been using this to the left foot wound. She currently denies signs of infection. 11/30; patient presents for follow-up. She has no issues or complaints today. She currently denies signs of infection. 12/7; patient presents for follow-up. She has no issues or complaints today. She has been using Hydrofera Blue to the right heel wound and Dakin solution to the left foot wound. Her right anterior leg wound is healed. She currently denies signs of infection. SHARISE, LIPPY A. ([id number]) 12/14; patient presents for follow-up. She has been using Hydrofera Blue to the right heel and Dakin's to the left foot wounds. She has no issues or complaints today. She denies signs of infection. 12/21; patient presents for follow-up. She reports using Hydrofera Blue to the right heel and Dakin's to the left foot wound. She denies signs of infection. 12/28; patient presents for follow-up. She continues to use Dakin's to the left foot wound and Hydrofera Blue to the right heel wound. She denies signs of infection. 1/4; patient presents for follow-up. She has no issues or complaints today. She denies signs of infection. 1/11; patient presents for follow-up. It is unclear if she has been dressing these wounds over the past week. She currently denies signs of infection. 1/18; patient presents for follow-up. She states she has been using Dakin's wet-to-dry dressings to the left foot. She has been using Hydrofera Blue to the right foot foot wound. She states that the anterior right leg wound has reopened and draining serous fluid. She denies signs of infection. 1/25; patient presents for follow-up. She has no issues or complaints today. 2/1; patient presents for follow-up. She has no issues or complaints today. She denies signs of infection. 2/8; patient presents for follow-up. She has lost her surgical shoes. She did not have a dressing  to the right heel wound. She currently denies signs of infection. 2/15; patient presents for follow-up. She reports more pain to the right heel today. She denies purulent drainage Or fever/chills 2/22; patient presents for follow-up. She reports taking clindamycin over the past week. She states that she continues to have pain to her right heel. She reports purulent drainage. Readmission 03/16/2022 Ms. Aylissa Heinemann is a 46 year old female with a past medical history of type 2 diabetes, osteomyelitis to her feet, chronic systolic heart failure and bipolar disorder that presents to the clinic for bilateral feet wounds and right lower extremity wound. She was last seen in our clinic on 12/15/2021. At that time she had purulent drainage coming out of her right plantar foot and I recommended she go to the ED. She states she went to [facility] and has been there for the past 3 months. I cannot see the records. She states she had OR debridement and was on several weeks of IV antibiotics while inpatient. Since discharge she has not been taking care of the wound beds. She had nothing on her feet other than socks today. She currently denies signs of infection. 5/31; patient presents for follow-up. She has been using Dakin's wet-to-dry dressings to the wound beds on her feet bilaterally and antibiotic ointment to the  right anterior leg wound. She had a wound culture done at last clinic visit that showed moderate Pseudomonas aeruginosa sensitive to ciprofloxacin. She currently denies systemic signs of infection. 6/14; patient presents for follow-up. She received Keystone 5 days ago and has been using this on the wound beds. She states that last week she had to go to the hospital because she had increased warmth and erythema to the right foot. She was started on 2 oral antibiotics. She states she has been taking these. She currently denies systemic signs of infection. She has no issues or complaints  today. 6/21; patient presents for follow-up. She states she has been using Keystone antibiotics to the wound beds. She has no issues or complaints today. She denies signs of infection. 6/28; patient presents for follow-up. She has been using Keystone antibiotics to the wound beds. She has no issues or complaints today. 7/12; patient presents for follow-up. Has been using Keystone antibiotics to the wound beds with calcium alginate. She has no issues or complaints today. She never followed up with her orthopedic surgeon who did the OR debridement to the right foot. We discussed the total contact cast for the left foot and patient would like to do this next week. 7/19; patient presents for follow-up. She has been using Keystone antibiotics with calcium alginate to the wound beds. She has no issues or complaints today. Patient is in agreement to do the total contact cast of the left foot today. She knows to return later this week for the obligatory cast change. 05-13-2022 upon evaluation today patient's wound which she has the cast of the left leg actually appears to be doing significantly better. Fortunately I do not see any signs of active infection locally or systemically which is great news and overall I am extremely pleased with where we stand currently. 7/26; patient presents for follow-up. She has a cast in place for the past week. She states it irritated her shin. Other than that she tolerated the cast well. She states she would like a break for 1 week from the cast. We have been using Keystone antibiotic and Aquacel to both wound beds. She denies signs of infection. 8/2; patient presents for follow-up. She has been using Keystone and Aquacel to the wound beds. She denies any issues and has no complaints. She is agreeable to have the cast placed today for the left leg. 06-03-2022 upon evaluation today patient appears to be doing well with regard to her wound she saw some signs of improvement  which is great news. Fortunately I do not see any evidence of active infection locally or systemically at this time which is great news. No fevers, chills, nausea, vomiting, or diarrhea. 8/16; patient presents for follow-up. She has no issues or complaints today. We have been using Keystone and Aquacel to the wound beds. The left lower extremity is in a total contact cast. She is tolerated this well. 8/23; patient presents for follow-up. She has had the total contact cast on the left leg for the past week. Unfortunately this has rubbed and broken down the skin to the medial foot. She currently denies signs of infection. She has been using Keystone antibiotic to the right plantar foot wound. KHYLER, [name] ([id number]) 8/30; patient presents for follow-up. We have held off on the total contact cast for the left leg for the past week. Her wound on the left foot has improved and the previous surrounding breakdown of skin has epithelialized. She has been using Keystone  antibiotic to both wound beds. She has no issues or complaints today. She denies signs of infection. Electronic Signature(s) Signed: 06/22/2022 3:18:05 PM By: Kalman Shan DO Entered By: Kalman Shan on 06/22/2022 15:04:03 [name] ([id number]) -------------------------------------------------------------------------------- Physical Exam Details Patient Name: Heather Dear A. Date of Service: 06/22/2022 1:30 PM Medical Record Number: [id number] Patient Account Number: [id number] Date of Birth/Sex: 08-22-1976 (46 y.o. Female) Treating RN: Cornell Barman Primary Care Provider: Raelene Bott Other Clinician: Massie Kluver Referring Provider: Raelene Bott Treating Provider/Extender: Yaakov Guthrie in Treatment: 14 Constitutional . Cardiovascular . Psychiatric . Notes Right foot: To the plantar heel there is an incision site with increased depth. Nonviable tissue at the surface, callus present. No  probing to bone. Left foot: To the medial aspect there is an open wound with granulation tissue and nonviable tissue. Electronic Signature(s) Signed: 06/22/2022 3:18:05 PM By: Kalman Shan DO Entered By: Kalman Shan on 06/22/2022 15:05:00 [name] ([id number]) -------------------------------------------------------------------------------- Physician Orders Details Patient Name: Heather Dear A. Date of Service: 06/22/2022 1:30 PM Medical Record Number: [id number] Patient Account Number: [id number] Date of Birth/Sex: 09-13-1976 (46 y.o. Female) Treating RN: Cornell Barman Primary Care Provider: Raelene Bott Other Clinician: Massie Kluver Referring Provider: Raelene Bott Treating Provider/Extender: Yaakov Guthrie in Treatment: 26 Verbal / Phone Orders: No Diagnosis Coding Follow-up Appointments o Return Appointment in 1 week. o Nurse Visit as needed [employer] o Wash wounds with antibacterial soap and water. o No tub bath. Anesthetic (Use 'Patient Medications' Section for Anesthetic Order Entry) o Lidocaine applied to wound bed Edema Control - Lymphedema / Segmental Compressive Device / Other o Elevate, Exercise Daily and Avoid Standing for Long Periods of Time. o Elevate legs to the level of the heart and pump ankles as often as possible o Elevate leg(s) parallel to the floor when sitting. Off-Loading o Total Contact Cast to Left Lower Extremity - hold cast another week contact cast #3 applied Don't get cast wet o Other: - keep pressure off of feet. Medications-Please add to medication list. o Keystone Compound - Keystone gel o Topical Antibiotic - AandD ointment to broken down area Wound Treatment Wound #11 - Foot Wound Laterality: Plantar, Right Cleanser: Soap and Water 1 x Per Day/30 Days Discharge Instructions: Gently cleanse wound with antibacterial soap, rinse and pat dry prior to dressing wounds Topical:  keystone gel 1 x Per Day/30 Days Primary Dressing: Aquacel Extra Hydrofiber Dressing, 4x5 (in/in) (Generic) 1 x Per Day/30 Days Discharge Instructions: packed into wound Secondary Dressing: ABD Pad 5x9 (in/in) (Generic) 1 x Per Day/30 Days Discharge Instructions: Cover with ABD pad Secured With: Medipore Tape - 43M Medipore H Soft Cloth Surgical Tape, 2x2 (in/yd) (Generic) 1 x Per Day/30 Days Secured With: Kerlix Roll Sterile or Non-Sterile 6-ply 4.5x4 (yd/yd) (Generic) 1 x Per Day/30 Days Discharge Instructions: Apply Kerlix as directed Wound #12 - Foot Wound Laterality: Plantar, Left, Medial Cleanser: Soap and Water 1 x Per Day/30 Days Discharge Instructions: Gently cleanse wound with antibacterial soap, rinse and pat dry prior to dressing wounds Topical: Vitamin AandD Ointment, 4(oz) tube 1 x Per Day/30 Days Discharge Instructions: apply to red, broken down area Topical: keystone gel 1 x Per Day/30 Days Discharge Instructions: apply very thin layer to wound Primary Dressing: Aquacel Extra Hydrofiber Dressing, 4x5 (in/in) (Generic) 1 x Per Day/30 Days [name], [name] ([id number]) Discharge Instructions: placed over keystone gel Secondary Dressing: ABD Pad 5x9 (in/in) (Generic) 1 x Per Day/30 Days Discharge Instructions: Cover with ABD  pad Secured With: Medipore Tape - 34M Medipore H Soft Cloth Surgical Tape, 2x2 (in/yd) (Generic) 1 x Per Day/30 Days Secured With: Kerlix Roll Sterile or Non-Sterile 6-ply 4.5x4 (yd/yd) (Generic) 1 x Per Day/30 Days Discharge Instructions: Apply Kerlix as directed Electronic Signature(s) Signed: 06/22/2022 3:18:05 PM By: Kalman Shan DO Entered By: Kalman Shan on 06/22/2022 15:07:47 Marcin, Letisha AMarland Kitchen ([id number]) -------------------------------------------------------------------------------- Problem List Details Patient Name: Heather Dear A. Date of Service: 06/22/2022 1:30 PM Medical Record Number: [id number] Patient Account Number:  [id number] Date of Birth/Sex: [birth year] (45 y.o. 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 ([id number]) -------------------------------------------------------------------------------- Progress Note Details Patient Name: Heather Dear A. Date of Service: 06/22/2022 1:30 PM Medical Record Number: [id number] Patient Account Number: [id number] Date of Birth/Sex: 1976-04-24 (46 y.o. Female) Treating RN: Cornell Barman Primary Care Provider: Raelene Bott Other Clinician: Massie Kluver Referring Provider: Raelene Bott Treating Provider/Extender: Yaakov Guthrie in Treatment: 14 Subjective Chief Complaint Information obtained from Patient 03/19/2021; patient referred by Dr. Luana Shu who has been looking after her left foot for quite a period of time for review of a nonhealing area in the left midfoot 03/12/2022; bilateral feet wounds and right lower extremity wound. History of Present Illness (HPI) 01/18/18-She is here for initial evaluation of the left great toe ulcer. She is a poor historian in regards  to timeframe in detail. She states approximately 4 weeks ago she lacerated her toe on something in the house. She followed up with her primary care who placed her on Bactrim and ultimately a second dose of Bactrim prior to coming to wound clinic. She states she has been treating the toe with peroxide, Betadine and a Band-Aid. She did not check her blood sugar this morning but checked it yesterday morning it was 327; she is unaware of a recent A1c and there are no current records. She saw Dr. she would've orthopedics last week for an old injury to the left ankle, she states he did not see her toe, nor did she bring it to his attention. She smokes approximately 1 pack cigarettes a day. Her social situation is concerning, she arrives this morning with her mother who appears extremely intoxicated/under the influence; her mother was asked to leave the room and be monitored by the patient's grandmother. The patient's aunt then accompanied the patient and the room throughout the rest of the appointment. We had a lengthy discussion regarding the deleterious effects of uncontrolled hyperglycemia and smoking as it relates to wound healing and overall health. She was strongly encouraged to decrease her smoking and get her diabetes under better control. She states she is currently on a diet and has cut down her [facility] consumption. The left toe is erythematous, macerated and slightly edematous with malodor present. The  edema in her left foot is below her baseline, there is no erythema streaking. We will treat her with Santyl, doxycycline; we have ordered and xray, culture and provided a Peg assist surgical shoe and cultured the wound. 01/25/18-She is here in follow-up evaluation for a left great toe ulcer and presents with an abscess to her suprapubic area. She states her blood sugars remain elevated, feeling "sick" and if levels are below 250, but she is trying. She has made no attempt to decrease her smoking  stating that we "can't take away her food in her cigarettes". She has been compliant with offloading using the PEG assist you. She is using Santyl daily. the culture obtained last week grew staph aureus and Enterococcus faecalis; continues on the doxycycline and Augmentin was added on Monday. The suprapubic area has erythema, no femoral variation, purple discoloration, minimal induration, was accessed with a cotton tip applicator with sanguinopurulent drainage, this was cultured, I suspect the current antibiotic treatment will cover and we will not add anything to her current treatment plan. She was advised to go to urgent care or ER with any change in redness, induration or fever. 02/01/18-She is here in follow-up evaluation for left great toe ulcers and a new abdominal abscess from last week. She was able to use packing until earlier this week, where she "forgot it was there". She states she was feeling ill with GI symptoms last week and was not taking her antibiotic. She states her glucose levels have been predominantly less than 200, with occasional levels between 200-250. She thinks this was contributing to her GI symptoms as they have resolved without intervention. There continues to be significant laceration to left toe, otherwise it clinically looks stable/improved. There is now less superficial opening to the lateral aspect of the great toe that was residual blister. We will transition to [facility] to all wounds, she will continue her Augmentin. If there is no change or deterioration next week for reculture. 02/08/18-She is here in follow-up evaluation for left great toe ulcer and abdominal ulcer. There is an improvement in both wounds. She has been wrapping her left toe with coban, not by our direction, which has created an area of discoloration to the medial aspect; she has been advised to NOT use coban secondary to her neuropathy. She states her glucose levels have been high over this  last week ranging from 200-350, she continues to smoke. She admits to being less compliant with her offloading shoe. We will continue with same treatment plan and she will follow-up next week. 02/15/18-She is here in follow-up evaluation for left great toe ulcer and abdominal ulcer. The abdominal ulcer is epithelialized. The left great toe ulcer is improved and all injury from last week using the Coban wrap is resolved, the lateral ulcer is healed. She admits to noncompliance with wearing offloading shoe and admits to glucose levels being greater than 300 most of the week. She continues to smoke and expresses no desire to quit. There is one area medially that probes deeper than it has historically, erythema to the toe and dorsal foot has consistently waxed and waned. There is no overt signs of cellulitis or infection but we will culture the wound for any occult infection given the new area of depth and erythema. We will hold off on sensitivities for initiation of antibiotic therapy. 02/22/18-She is here in follow up evaluation for left great toe ulcer. There is overall significant improvement in both wound appearance, erythema and edema with changes  made last week. She was not initiated on antibiotic therapy. Culture obtained last week showed oxacillin sensitive staph aureus, sensitive to clindamycin. Clindamycin has been called into the pharmacy but she has been instructed to hold off on initiation secondary to overall clinical improvement and her history of antibiotic intolerance. She has been instructed to contact the clinic with any noted changes/deterioration and the wound, erythema, edema and/or pain. She will follow-up next week. She continues to smoke and her glucose levels remain elevated >250; she admits to compliance with offloading shoe 03/01/18 on evaluation today patient appears to be doing fairly well in regard to her left first toe ulcer. She has been tolerating the dressing changes with  the [facility] Dressing without complication and overall this has definitely showed signs of improvement according to records as well is what the patient tells me today. I'm very pleased in that regard. She is having no pain today 03/08/18 She is here for follow up evaluation of a left great toe ulcer. She remains non-compliant with glucose control and smoking cessation; glucose levels consistently >200. She states that she got new shoe inserts/peg assist. She admits to compliance with offloading. Since my last evaluation there is significant improvement. We will switch to prisma at this time and she will follow up next week. She is noted to be tachycardic at this appointment, heart rate 120s; she has a history of heart rate 70-130 according to our records. She admits to extreme agitation r/t personal issues; she was advised to monitor her heartrate and contact her physician if it does not return to a more normal range (<100). She takes cardizem twice daily. 03/15/18-She is here in follow-up evaluation for left great toe ulcer. She remains noncompliant with glucose control and smoking cessation. She admits to compliance with wearing offloading shoe. The ulcer is improved/stable and we will continue with the same treatment plan and she will follow-up next week 03/22/18-She is here for evaluation for left great toe ulcer. There continues to be significant improvement despite recurrent hyperglycemia (over 500 yesterday) and she continues to smoke. She has been compliant with offloading and we will continue with same treatment plan and she will follow-up next week. Noteboom, [name] ([id number]) 03/29/18-She is here for evaluation for left great toe ulcer. Despite continuing to smoke and uncontrolled diabetes she continues to improve. She is compliant with offloading shoe. We will continue with the same treatment plan and she will follow-up next week 04/05/18- She is here in follow up evaluation for a left  great toe ulcer; she presents with small pustule to left fifth toe (resembles ant bite). She admits to compliance with wearing offloading shoe; continues to smoke or have uncontrolled blood glucose control. There is more callus than usual with evidence of bleeding; she denies known trauma. 04/12/18-She is here for evaluation of left great toe ulcer. Despite noncompliance with glycemic control and smoking she continues to make improvement. She continues to wear offloading shoe. The pustule, that was identified last week, to the left fifth toe is resolved. She will follow-up in 2 weeks 05/03/18-she is seen in follow-up evaluation for a left great toe ulcer. She is compliant with offloading, otherwise noncompliant with glycemic control and smoking. She has plateaued and there is minimal improvement noted. We will transition to [facility], replaced the insert to her surgical shoe and she will follow-up in one week 05/10/18- She is here in follow up evaluation for a left great toe ulcer. It appears stable despite measurement  change. We will continue with same treatment plan and follow up next week. 05/24/18-She is seen in follow-up evaluation for a left great toe ulcer. She remains compliant with offloading, has made significant improvement in her diet, decreasing the amount of sugar/soda. She said her recent A1c was 10.9 which is lower than. She did see a diabetic nutritionist/educator yesterday. She continues to smoke. We will continue with the same treatment plan and she'll follow-up next week. 05/31/18- She is seen in follow-up evaluation for left great toe ulcer. She continues to remain compliant with offloading, continues to make improvement in her diet, increasing her water and decreasing the amount of sugar/soda. She does continue to smoke with no desire to quit. We will apply Prisma to the depth and Hydrofera Blue over. We have not received insurance authorization for oasis. She will follow up next  week. 06/07/18-She is seen in follow-up evaluation for left great toe ulcer. It has stalled according to today's measurements although base appears stable. She says she saw a diabetic educator yesterday; her average blood sugars are less than 300 which is an improvement for her. She continues to smoke and states "that's my next step" She continues with water over soda. We will order for xray, culture and reinstate ace wrap compression prior to placing apligraf for next week. She is voicing no complaints or concerns. Her dressing will change to iodoflex over the next week in preparation for apligraf. 06/14/18-She is seen in follow-up evaluation for left great toe ulcer. Plain film x-ray performed last week was negative for osteomyelitis. Wound culture obtained last week grew strep B and OSSA; she is initiated on keflex and cefdinir today; there is erythema to the toe which could be from ace wrap compression, she has a history of wrapping too tight and has has been encouraged to maintain ace wraps that we place today. We will hold off on application of apligraf today, will apply next week after antibiotic therapy has been initiated. She admits today that she has resumed taking a shower with her foot/toe submerged in water, she has been reminded to keep foot/toe out of the bath water. She will be seen in follow up next week 06/21/18-she is seen in follow-up evaluation for left great toe ulcer. She is tolerating antibiotic therapy with no GI disturbance. The wound is stable. Apligraf was applied today. She has been decreasing her smoking, only had 4 cigarettes yesterday and 1 today. She continues being more compliant in diabetic diet. She will follow-up next week for evaluation of site, if stable will remove at 2 weeks. 06/28/18- She is here in follow up evalution. Apligraf was placed last week, she states the dressing fell off on Tuesday and she was dressing with hydrofera blue. She is healed and will be  discharged from the clinic today. She has been instructed to continue with smoking cessation, continue monitoring glucose levels, offloading for an additional 4 weeks and continue with hydrofera blue for additional two weeks for any possible microscopic opening. Readmission: 08/07/18 on evaluation today patient presents for reevaluation concerning the ulcer of her right great toe. She was previously discharged on 06/28/18 healed. Nonetheless she states that this began to show signs of drainage she subsequently went to her primary care provider. Subsequently an x-ray was performed on 08/01/18 which was negative. The patient was also placed on antibiotics at that time. Fortunately they should have been effective for the infection. Nonetheless she's been experiencing some improvement but still has a lot of drainage  coming from the wound itself. 08/14/18 on evaluation today patient's wound actually does show signs of improvement in regard to the erythema at this point. She has completed the antibiotics. With that being said we did discuss the possibility of placing her in a total contact cast as of today although I think that I may want to give this just a little bit more time to ensure nothing recurrence as far as her infection is concerned. I do not want to put in the cast and risk infection at that time if things are not completely resolved. With that being said she is gonna require some debridement today. 08/21/18 on evaluation today patient actually appears to be doing okay in regard to her toe ulcer. She's been tolerating the dressing changes without complication. With that being said it does appear that she is ready and in fact I think it's appropriate for [place] to go ahead and initiate the total contact cast today. Nonetheless she will require some sharp debridement to prepare the wound for application. Overall I feel like things have been progressing well but we do need to do something to get this to  close more readily. 08/24/18 patient seen today for reevaluation after having had the total contact cast applied on Tuesday. She seems to have done very well the wound appears to be doing great and overall I'm pleased with the progress that she's made. There were no abnormal areas of rubbing from the cast on her lower extremity. 08/30/18 on evaluation today patient actually appears to be completely healed in regard to her plantar toe ulcer. She tells me at this point she's been having a lot of issues with the cast. She almost fell a couple of times the state shall the step of her dog a couple times as well. This is been a very frustrating process for her other nonetheless she has completely healed the wound which is excellent news. Overall there does not appear to be the evidence of infection at this time which is great news. 09/11/18 evaluation today patient presents for follow-up concerning her great toe ulcer on the left which has unfortunately reopened since I last saw her which was only a couple of weeks ago. Unfortunately she was not able to get in to get the shoe and potentially the AFO that's gonna be necessary due to her left foot drop. She continues with offloading shoe but this is not enough to prevent her from reopening it appears. When we last had her in the total contact cast she did well from a healing standpoint but unfortunately the wound reopened as soon as she came out of the cast within just a couple of weeks. 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 This is also part of the reason why she falls on a regular basis. I just do not believe that is gonna be safe for [place] to reinitiate the total contact cast as last time we had this on she fell 3 times one week which is definitely not normal  for her. 09/18/18 upon evaluation today the patient actually appears to be doing about the same in regard to her toe ulcer. She did not contact [facility] as I asked her to even though I had given her the prescription. In fact she  actually states that she has no idea where the prescription is. She did apparently call [facility] and they told her that all she needed to do was bring the prescription in order to be able to be seen and work on getting the AFO for her left foot. With all that being said she still does not have an appointment and I'm not sure were things stand that regard. I will give her a new prescription today in order to contact them to get this set up. KAYDIN, LABO A. ([id number]) 09/25/18 on evaluation today patient actually appears to be doing about the same in regard to her toes ulcer. She does have a small areas which seems to have a lot of callous buildup around the edge of the wound which is going to need sharp debridement today. She still is waiting to be scheduled for evaluation with [facility] for possibility of an AFO. She states there supposed to call her tomorrow to get this set up. Unfortunately it does appear that her foot specifically the toe area is showing signs of erythema. There does not appear to be any systemic infection which is in these good news. 10/02/18 on evaluation today patient actually appears to be doing about the same in regard to her toe ulcer. This really has not done too well although it's not significantly larger it's also not significantly smaller. She has been tolerating the dressing changes without complication. She actually has her appointment with [facility] and [facility] tomorrow to hopefully be measured for obtaining and AFO splint. I think this would be helpful preventing this from reoccurring. We had contemplated starting the cast this week although to be honest I am reluctant to do that as she's been having nausea, vomiting, and seizure activity over  the past three days. She has a history of seizures and have been told is nothing that can be done for these. With that being said I do believe that along with the seizures have the nausea vomiting which upon further questioning doesn't seem to be the normal for her and makes me concerned for the possibility of infection or something else going on. I discussed this with the patient and her mother during the office visit today. I do not feel the wound is effective but maybe something else. The responses this was "this just happens to her at times and we don't know why". They did not seem to be interested in going to the hospital to have this checked out further. 10/09/18 on evaluation today patient presents for follow-up concerning her ongoing toe ulcer. She has been tolerating the dressing changes without complication. Fortunately there does not appear to be any evidence of infection which is great news however I do think that the patient would benefit from going ahead for with the total contact cast. She's actually in a wheelchair today she tells me that she will use her walker if we initiate the cast. I was very specific about the fact that if we were gonna do the cast I wanted to make sure that she was using the walker in order to prevent any falls. She tells me she does not have stairs that she has to traverse on a regular basis at her home. She has not had any seizures since last week again that something that happens to her often she tells me she did talk to [employer] and they said that it may take up to three weeks to get the brace approved for her. Hopefully that will not  take that long but nonetheless in the meantime I do think the cast could be of benefit. 10/12/18 on evaluation today patient appears to be doing rather well in regard to her toe ulcer. It's just been a few days and already this is significantly improved both as far as overall appearance and size. Fortunately there's no sign of  infection. She is here for her first obligatory cast change. 10/19/18 Seen today for follow up and management of left great toe ulcer. Wound continues to show improvement. Noted small open area with seroussang drainage with palpation. Denies any increased pain or recent fevers during visit. She will continue calcium alginate with offloading shoe. Denies any questions or concerns during visit. 10/26/18 on evaluation today patient appears to be doing about the same as when I last saw her in regard to her wound bed. Fortunately there does not appear to be any signs of infection. Unfortunately she continues to have a breakdown in regard to the toe region any time that she is not in the cast. It takes almost no time at all for this to happen. Nonetheless she still has not heard anything from the brace being made by [employer] as to when exactly this will be available to her. Fortunately there is no signs of infection at this time. 10/30/18 on evaluation today patient presents for application of the total contact cast as we just received him this morning. Fortunately we are gonna be able to apply this to her today which is great news. She continues to have no significant pain which is good news. Overall I do feel like things have been improving while she was the cast is when she doesn't have a cast that things get worse. She still has not really heard anything from [facility] regarding her brace. 11/02/18 upon evaluation today patient's wound already appears to be doing significantly better which is good news. Fortunately there does not appear to be any signs of infection also good news. Overall I do think the total contact cast as before is helping to heal this area unfortunately it's just not gonna likely keep the area closed and healed without her getting her brace at least. Again the foot drop is a significant issue for her. 11/09/18 on evaluation today patient appears to be doing excellent in regard to her toe  ulcer which in fact is completely healed. Fortunately we finally got the situation squared away with the paperwork which was needed to proceed with getting her brace approved by Medicaid. I have filled that out unfortunately that information has been sent to the orthopedic office that I worked at 2 1/2 years ago and not tired Current wound care measures. Fortunately she seems to be doing very well at this time. 11/23/18 on evaluation today patient appears to be doing More Poorly Compared to Last Time I Saw Her. At [facility] She Had Completely Healed. Currently she is continuing to have issues with reopening. She states that she just found out that the brace was approved through Medicaid now she just has to go get measured in order to have this fitted for her and then made. Subsequently she does not have an appointment for this yet that is going to complicate things we obviously cannot put her back in the cast if we do not have everything measured because they're not gonna be able to measure her foot while she is in the cast. Unfortunately the other thing that I found out today as well is that she was  in the hospital over the weekend due to having a heroin overdose. Obviously this is unfortunate and does have me somewhat worried as well. 11/30/18 on evaluation today patient's toe ulcer actually appears to be doing fairly well. The good news is she will be getting her brace in the shoes next week on Wednesday. Hopefully we will be able to get this to heal without having to go back in the cast however she may need the cast in order to get the wound completely heal and then go from there. Fortunately there's no signs of infection at this time. 12/07/18 on evaluation today patient fortunately did receive her brace and she states she could tell this definitely makes her walk better. With that being said she's been having issues with her toe where she noticed yesterday there was a lot of tissue that was loosing  off this appears to be much larger than what it was previous. She also states that her leg has been read putting much across the top of her foot just about the ankle although this seems to be receiving somewhat. The total area is still red and appears to be someone infected as best I can tell. She is previously taken Bactrim and that may be a good option for her today as well. We are gonna see what I wound culture shows as well and I think that this is definitely appropriate. With that being said outside of the culture I still need to initiate something in the interim and that's what I'm gonna go ahead and select Bactrim is a good option for her. 12/14/18 on evaluation today patient appears to be doing better in regard to her left great toe ulcer as compared to last week's evaluation. There's still some erythema although this is significantly improved which is excellent news. Overall I do believe that she is making good progress is still gonna take some time before she is where I would like her to be from the standpoint of being able to place her back into the total contact cast. Hopefully we will be where we need to be by next week. 12/21/18 on evaluation today patient actually appears to be doing poorly in regard to her toe ulcer. She's been tolerating the dressing changes without complication. Fortunately there's no signs of systemic infection although she does have a lot of drainage from the toe ulcer and this does [name], [name] ([id number]) seem to be causing some issues at this point. She does have erythema on the distal portion of her toe that appears to be likely cellulitis. 12/28/18 on evaluation today patient actually appears to be doing a little better in my pinion in regard to her toe ulcer. With that being said she still does have some evidence of infection at this time and for her culture she had both E. coli as well as enterococcus as organisms noted on evaluation. For that reason I  think that though the Keflex likely has treated the E. coli rather well this has really done nothing for the enterococcus. We are going to have to initiate treatment for this specifically. 01/04/19 on evaluation today patient's toe actually appears to be doing better from the standpoint of infection. She currently would like to see about putting the cash back on I think that this is appropriate as long as she takes care of it and keeps it from getting wet. She is gonna have some drainage we can definitely pass this up with Drawtex and alginate to try  to prevent as much drainage as possible from causing the problems. With that being said I do want to at least try her with the cast between now and Tuesday. If there any issues we can't continue to use it then I will discontinue the use of the cast at that point. 01/08/19 on evaluation today patient actually appears to be doing very well as far as her foot ulcer specifically the great toe on the left is concerned. She did have an area of rubbing on the medial aspect of her left ankle which again is from the cast. Fortunately there's no signs of infection at this point in this appears to be a very slight skin breakdown. The patient tells me she felt it rubbing but didn't think it was that bad. Fortunately there is no signs of active infection at this time which is good news. No fevers, chills, nausea, or vomiting noted at this time. 01/15/19 on evaluation today patient actually appears to be doing well in regard to her toe ulcer. Again as previous she seems to do well and she has the cast on which indicates to me that during the time she doesn't have a cast on she's putting way too much pressure on this region. Obviously I think that's gonna be an issue as with the current national emergency concerning the Covid-19 Virus it has been recommended that we discontinue the use of total contact casting by the chief medical officer of our company, Dr. Simona Huh. The  reasoning is that if a patient becomes sick and cannot come into have the cast removed they could not just leave this on for an additional two weeks. Obviously the hospitals also do not want to receive patient's who are sick into the emergency department to potentially contaminate the region and spread the Covid-19 Virus among other sick individuals within the hospital system. Therefore at this point we are suspending the use of total contact cast until the current emergency subsides. This was all discussed with the patient today as well. 01/22/19 on evaluation today patient's wound on her left great toe appears to be doing slightly worse than previously noted last week. She tells me that she has been on this quite a bit in fact she tells me she's been awake for 38 straight hours. This is due to the fact that she's having to care for grandparents because nobody else will. She has been taking care of them for five the last seven days since I've seen her they both have dementia his is from a stroke and her grandmother's was progressive. Nonetheless she states even her mom who knows her condition and situation has only help two of those days to take care of them she's been taking care of the rest. Fortunately there does not appear to be any signs of active infection in regard to her toe at this point although obviously it doesn't look as good as it did previous. I think this is directly related to her not taking off the pressure and friction by way of taking things easy. Though I completely understand what's going on. 01/29/19 on evaluation today patient's tools are actually appears to be showing some signs of improvement today compared to last week's evaluation as far as not necessarily the overall size of the wound but the fact that she has some new skin growth in between the two ends of the wound opening. Overall I feel like she has done well she states that she had a family member give  her what sounds to  be a CAM walker boot which has been helpful as well. 02/05/19 on evaluation today patient's wound bed actually appears to be doing significantly better in regard to her overall appearance of the size of the wound. With that being said she is still having an issue with offloading efficiently enough to get this to close. Apparently there is some signs of infection at this point as well unfortunately. Previously she's done well of Augmentin I really do not see anything that needs to be culture currently but there theme and cellulitis of the foot that I'm seeing I'm gonna go ahead and place her on an antibiotic today to try to help clear this up. 02/12/2019 on evaluation today patient actually appears to be doing poorly in regard to her overall wound status. She tells me she has been using her offloading shoe but actually comes in today wearing her tennis shoe with the AFO brace. Again as I previously discussed with her this is really not sufficient to allow the area to heal appropriately. Nonetheless she continues to be somewhat noncompliant and I do wonder based on what she has told my nurse in the past as to whether or not a good portion of this noncompliance may be recreational drug and alcohol related. She has had a history of heroin overdose and this was fairly recently in the past couple of months that have been seeing her. Nonetheless overall I feel like her wound looks significantly worse today compared to what it was previous. She still has significant erythema despite the Augmentin I am not sure that this is an appropriate medication for her infection I am also concerned that the infection may have gone down into her bone. 02/19/19 on evaluation today patient actually appears to be doing about the same in regard to her toe ulcer. Unfortunately she continues to show signs of bone exposure and infection at this point. There does not appear to be any evidence of worsening of the infection but I'm  also not really sure that it's getting significantly better. She is on the Augmentin which should be sufficient for the Staphylococcus aureus infection that she has at this point. With that being said she may need IV antibiotics to more appropriately treat this. We did have a discussion today about hyperbaric option therapy. 02/28/19 on evaluation today patient actually appears to be doing much worse in regard to the wound on her left great toe as compared to even my previous evaluation last week. Unfortunately this seems to be training in a pretty poor direction. Her toe was actually now starting to angle laterally and I can actually see the entire joint area of the proximal portion of the digit where is the distal portion of the digit again is no longer even in contact with the joint line. Unfortunately there's a lot more necrotic tissue around the edge and the toe appears to be showing signs of becoming gangrenous in my pinion. I'm very concerned about were things stand at this point. She did see infectious disease and they are planning to send in a prescription for Sivextro for her and apparently this has been approved. With that being said I don't think she should avoid taking this but at the same time I'm not sure that it's gonna be sufficient to save her toe at this point. She tells me that she still having to care for grandparents which I think is putting quite a bit of strain on her foot and  specifically the total area and has caused this to break down even to a greater degree than would've otherwise been expected. 03/05/19 on evaluation today patient actually appears to be doing quite well in regard to her toe all things considering. She still has bone exposed but there appears to be much less your thing on overall the appearance of the wound and the toe itself is dramatically improved. She still does have some issues currently obviously with infection she did see vascular as well and there  concerned that her blood flow to the toad. For that reason they are setting up for an angiogram next week. 03/14/19 on evaluation today patient appears to be doing very poor in regard to her toe and specifically in regard to the ulceration and the fact that she's starting to notice the toe was leaning even more towards the lateral aspect and the complete joint is visible on the proximal aspect of the joint. Nonetheless she's also noted a significant odor and the tip of the toe is turning more dark and necrotic appearing. Overall I think she is getting worse not better as far as this is concerned. For that reason I am recommending at this point that she likely needs to be seen for likely amputation. JOSHLYN, BEADLE ([id number]) READMISSION 03/19/2021 This is a patient that we cared for in this clinic for a prolonged period of time in 2019 and 2020 with a left foot and left first toe wound. 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 Luana Shu. In December 2021 she had an ulcer on her right great toe as well as the fourth and fifth toes. 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 She also had an angiogram at that time and underwent angioplasty of the right anterior tibial artery. In any case she claims that the wound on the right foot is closed I did not look at this today which was probably an oversight although I think that should be done next week. After her surgery she developed a dehiscence but I do not see any follow-up of this. According to Dr. Deborra Medina last review that she was out of the area being cared for by another physician but recently came back to his attention. 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 A culture of this area showed E. coli apparently before she came back to see Dr. Luana Shu she was supposed to be  receiving antibiotics but she did not really take them. Nor is she offloading this area at all. Finally her last hemoglobin A1c listed in epic was in March 2022 at 14.1 she says things are a lot better since then although I am not sure. 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 She had this wound at the time. During this admission she had E. coli sepsis a TEE was negative. 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 She is still a heavy cigarette smoker. She has a history of bipolar disorder seizure disorder ABI in our clinic was 1.05 6/1; left midfoot in the setting of a TMA done previously. Round  circular wound with a "knuckle" of protruding tissue. The problem is that the knuckle was not attached to any of the surrounding granulation and this probed proximally widely I removed a large portion of this tissue. This wound goes with considerable undermining laterally. I do not feel any bone there was no purulence but this is a deep wound. 6/8; in spite of the debridement I did last week. She arrives with a wound looking exactly the same. A protruding "knuckle" of tissue nonadherent to most of the surrounding tissue. There is considerable depth around this from 6-12 o'clock at 2.7 cm and undermining of 1 cm. This does not look overtly infected and the x-ray I did last week was negative for any osseous abnormalities. We have been using silver collagen 6/15; deep tissue culture I did last week showed moderate staph aureus and moderate Pseudomonas. This will definitely require prolonged antibiotic therapy. The pathology on the protuberant area was negative for  malignancy fungus etc. 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 We have been using silver collagen. I am going to be prescribing Levaquin for 2 weeks. Her CT scan of the foot is down for 7/5 6/22; CT scan of the foot on 7 5. She says she has hardware in the left leg from her previous fracture. She is on the Levaquin for the deep tissue culture I did that showed methicillin sensitive staph aureus and Pseudomonas. I gave her a 2-week supply and she will have another week. She arrives in clinic today with the same protuberant tissue however this is nonadherent to the tissue surrounding it. I am really at a loss to explain this unless there is underlying deep tissue infection 6/29; patient presents for 1 week follow-up. She has been using collagen to the wound bed. She reports taking her antibiotics as prescribed.She has no complaints or issues today. She denies signs of infection. 7/6; patient presents for one week followup. She has been using collagen to the wound bed. She states she is taking Levaquin however at times she is not able to keep it down. She denies signs of infection. 7/13; patient presents for 1 week follow-up. She has been using silver alginate to the wound bed. She still has nausea when taking her antibiotics. She denies signs of infection. 7/20; patient presents for 1 week follow-up. She has been using silver alginate with gentamicin cream to the wound bed. She denies any issues and has no complaints today. She denies signs of infection. 7/27; patient presents for 1 week follow-up. She continues to use silver alginate with gentamicin cream to the wound bed. She reports starting her antibiotics. She has no issues or complaints. Overall she reports stability to the wound. 8/3; patient presents for 1 week follow-up. She has been using silver alginate with gentamicin cream to the wound bed. She reports completing all  antibiotics. She has no issues or complaints today. She denies signs of infection. 8/17; patient presents for 2-week follow-up. He is to use silver alginate to the wound bed. She has no issues or complaints today. She denies signs of infection. She reports her pain has improved in her foot since last clinic visit 8/24; patient presents for 1 week follow-up. She continues to use silver alginate to the wound bed. She has no issues or complaints. She denies signs of infection. Pain is stable. 9/7; patient presents for follow-up. She missed her last week appointment due to feeling ill. She continues to use silver  alginate. She has a new wound to the right lower extremity that is covered in eschar. She states It occurred over the past week and has no idea how it started. She currently denies signs of infection. 9/14; patient presents for follow-up. To the left foot wound she has been using gentamicin cream and silver alginate. To the right lower extremity wound she has been keeping this covered and has not obtain Santyl. 9/21; patient presents for follow-up. She reports using gentamicin cream and silver alginate to the left foot and Santyl to the right lower extremity wound. She has no issues or complaints today. She denies signs of infection. 9/28; patient presents for follow-up. She reports a new wound to her right heel. She states this occurred a few days ago and is progressively gotten worse. She has been trying to clean the area with a Q-tip and Santyl. She reports stability in the other 2 wounds. She has been using gentamicin cream and silver alginate to the left foot and Santyl to the right lower extremity wound. 10/12; patient presents for follow-up. She reports improvement to the wound beds. She is seeing vein and vascular to discuss the potential of a left BKA. She states they are going to do an arteriogram. She continues to use silver alginate with dressing changes to her wounds. 11/2; patient  presents for follow-up. She states she has not been doing dressing changes to the wound beds. She states she is not able to offload the areas. She reports chronic pain to her left foot wound. 11/9; patient presents for follow-up. She came in with only socks on. She states she forgot to put on shoes. It is unclear if she is doing any dressing changes. She currently denies systemic signs of infection. 11/16; patient presents for follow-up. She came again only with socks on. She states she does not wear shoes ever. It is unclear if she does dressing changes. She currently denies systemic signs of infection. 11/23; patient presents for follow-up. She wore her shoes today. It still unclear exactly what dressing she is using for each wound but she did states she obtained Dakin's solution and has been using this to the left foot wound. She currently denies signs of infection. [name], BARNO ([id number]) 11/30; patient presents for follow-up. She has no issues or complaints today. She currently denies signs of infection. 12/7; patient presents for follow-up. She has no issues or complaints today. She has been using Hydrofera Blue to the right heel wound and Dakin solution to the left foot wound. Her right anterior leg wound is healed. She currently denies signs of infection. 12/14; patient presents for follow-up. She has been using Hydrofera Blue to the right heel and Dakin's to the left foot wounds. She has no issues or complaints today. She denies signs of infection. 12/21; patient presents for follow-up. She reports using Hydrofera Blue to the right heel and Dakin's to the left foot wound. She denies signs of infection. 12/28; patient presents for follow-up. She continues to use Dakin's to the left foot wound and Hydrofera Blue to the right heel wound. She denies signs of infection. 1/4; patient presents for follow-up. She has no issues or complaints today. She denies signs of infection. 1/11;  patient presents for follow-up. It is unclear if she has been dressing these wounds over the past week. She currently denies signs of infection. 1/18; patient presents for follow-up. She states she has been using Dakin's wet-to-dry dressings to the left foot. She has been  using Hydrofera Blue to the right foot foot wound. She states that the anterior right leg wound has reopened and draining serous fluid. She denies signs of infection. 1/25; patient presents for follow-up. She has no issues or complaints today. 2/1; patient presents for follow-up. She has no issues or complaints today. She denies signs of infection. 2/8; patient presents for follow-up. She has lost her surgical shoes. She did not have a dressing to the right heel wound. She currently denies signs of infection. 2/15; patient presents for follow-up. She reports more pain to the right heel today. She denies purulent drainage Or fever/chills 2/22; patient presents for follow-up. She reports taking clindamycin over the past week. She states that she continues to have pain to her right heel. She reports purulent drainage. Readmission 03/16/2022 Ms. Vannia Pola is a 46 year old female with a past medical history of type 2 diabetes, osteomyelitis to her feet, chronic systolic heart failure and bipolar disorder that presents to the clinic for bilateral feet wounds and right lower extremity wound. She was last seen in our clinic on 12/15/2021. At that time she had purulent drainage coming out of her right plantar foot and I recommended she go to the ED. She states she went to [facility] and has been there for the past 3 months. I cannot see the records. She states she had OR debridement and was on several weeks of IV antibiotics while inpatient. Since discharge she has not been taking care of the wound beds. She had nothing on her feet other than socks today. She currently denies signs of infection. 5/31; patient presents for  follow-up. She has been using Dakin's wet-to-dry dressings to the wound beds on her feet bilaterally and antibiotic ointment to the right anterior leg wound. She had a wound culture done at last clinic visit that showed moderate Pseudomonas aeruginosa sensitive to ciprofloxacin. She currently denies systemic signs of infection. 6/14; patient presents for follow-up. She received Keystone 5 days ago and has been using this on the wound beds. She states that last week she had to go to the hospital because she had increased warmth and erythema to the right foot. She was started on 2 oral antibiotics. She states she has been taking these. She currently denies systemic signs of infection. She has no issues or complaints today. 6/21; patient presents for follow-up. She states she has been using Keystone antibiotics to the wound beds. She has no issues or complaints today. She denies signs of infection. 6/28; patient presents for follow-up. She has been using Keystone antibiotics to the wound beds. She has no issues or complaints today. 7/12; patient presents for follow-up. Has been using Keystone antibiotics to the wound beds with calcium alginate. She has no issues or complaints today. She never followed up with her orthopedic surgeon who did the OR debridement to the right foot. We discussed the total contact cast for the left foot and patient would like to do this next week. 7/19; patient presents for follow-up. She has been using Keystone antibiotics with calcium alginate to the wound beds. She has no issues or complaints today. Patient is in agreement to do the total contact cast of the left foot today. She knows to return later this week for the obligatory cast change. 05-13-2022 upon evaluation today patient's wound which she has the cast of the left leg actually appears to be doing significantly better. Fortunately I do not see any signs of active infection locally or systemically which is  great news  and overall I am extremely pleased with where we stand currently. 7/26; patient presents for follow-up. She has a cast in place for the past week. She states it irritated her shin. Other than that she tolerated the cast well. She states she would like a break for 1 week from the cast. We have been using Keystone antibiotic and Aquacel to both wound beds. She denies signs of infection. 8/2; patient presents for follow-up. She has been using Keystone and Aquacel to the wound beds. She denies any issues and has no complaints. She is agreeable to have the cast placed today for the left leg. 06-03-2022 upon evaluation today patient appears to be doing well with regard to her wound she saw some signs of improvement which is great news. Fortunately I do not see any evidence of active infection locally or systemically at this time which is great news. No fevers, chills, nausea, vomiting, or diarrhea. SHEREE, LALLA A. ([id number]) 8/16; patient presents for follow-up. She has no issues or complaints today. We have been using Keystone and Aquacel to the wound beds. The left lower extremity is in a total contact cast. She is tolerated this well. 8/23; patient presents for follow-up. She has had the total contact cast on the left leg for the past week. Unfortunately this has rubbed and broken down the skin to the medial foot. She currently denies signs of infection. She has been using Keystone antibiotic to the right plantar foot wound. 8/30; patient presents for follow-up. We have held off on the total contact cast for the left leg for the past week. Her wound on the left foot has improved and the previous surrounding breakdown of skin has epithelialized. She has been using Keystone antibiotic to both wound beds. She has no issues or complaints today. She denies signs of infection. Objective Constitutional Vitals Time Taken: 1:54 PM, Temperature: 98.9 F, Pulse: 57 bpm, Respiratory Rate: 18 breaths/min,  Blood Pressure: 109/57 mmHg. General Notes: Right foot: To the plantar heel there is an incision site with increased depth. Nonviable tissue at the surface, callus present. No probing to bone. Left foot: To the medial aspect there is an open wound with granulation tissue and nonviable tissue. Integumentary (Hair, Skin) Wound #11 status is Open. Original cause of wound was Surgical Injury. The date acquired was: 12/01/2021. The wound has been in treatment 14 weeks. The wound is located on the [place]. The wound measures 0.5cm length x 0.2cm width x 0.5cm depth; 0.079cm^2 area and 0.039cm^3 volume. There is Fat Layer (Subcutaneous Tissue) exposed. There is a medium amount of serosanguineous drainage noted. The wound margin is distinct with the outline attached to the wound base. There is medium (34-66%) pink, pale granulation within the wound bed. There is a medium (34-66%) amount of necrotic tissue within the wound bed including Adherent Slough. Wound #12 status is Open. Original cause of wound was Pressure Injury. The date acquired was: 03/16/2020. The wound has been in treatment 14 weeks. The wound is located on the [place]. The wound measures 3cm length x 1.5cm width x 0.2cm depth; 3.534cm^2 area and 0.707cm^3 volume. There is Fat Layer (Subcutaneous Tissue) exposed. There is a medium amount of serous drainage noted. The wound margin is flat and intact. There is large (67-100%) red, pink granulation within the wound bed. There is a small (1-33%) amount of necrotic tissue within the wound bed including Adherent Slough. 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 I debrided nonviable tissue. The left foot peri-wound no longer has area of skin breakdown or blue/green drainage. The epithelization does appear vulnerable and I would like to keep her out of a cast for 1 more week. Plan is to redo the cast at next clinic visit on the left leg. I recommended continuing to aggressively offload the wound beds and use Keystone and calcium alginate for dressing changes. Procedures Wound #11 Pre-procedure diagnosis of Wound #11 is an Open Surgical Wound located on the Right,Plantar Foot . There was a Excisional Skin/Subcutaneous Tissue Debridement with a total area of 0.1 sq cm performed by Kalman Shan, MD. With the following instrument(s): Curette to remove Viable and Non-Viable tissue/material. Material removed includes Callus, Subcutaneous Tissue, and Slough. A time out was conducted at 14:22, prior to the start of the procedure. A Minimum amount of bleeding was controlled with Pressure. The procedure was tolerated well. Post Debridement Measurements: 0.5cm length x 0.2cm width x 0.6cm depth; 0.047cm^3 volume. [name], [name] ([id number]) Character of Wound/Ulcer Post Debridement is stable. 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 There was a Excisional Skin/Subcutaneous Tissue Debridement with a total area of 6.3 sq cm performed by Kalman Shan, MD. With the following instrument(s): Curette to remove Viable and Non-Viable tissue/material. Material removed includes Callus, Subcutaneous Tissue, and Slough. A time out was conducted at 14:26, prior to the start of the procedure. A Minimum amount of bleeding was controlled with Pressure. The procedure was tolerated well. Post  Debridement Measurements: 3cm length x 1.5cm width x 0.2cm depth; 0.707cm^3 volume. Character of Wound/Ulcer Post Debridement is stable. Severity of Tissue Post Debridement is: Fat layer exposed. Post procedure Diagnosis Wound #12: Same as Pre-Procedure Plan Follow-up Appointments: Return Appointment in 1 week. Nurse Visit as needed Bathing/ Shower/ Hygiene: Wash wounds with antibacterial soap and water. No tub bath. Anesthetic (Use 'Patient Medications' Section for Anesthetic Order Entry): Lidocaine applied to wound bed Edema Control - Lymphedema / Segmental Compressive Device / Other: Elevate, Exercise Daily and Avoid Standing for Long Periods of Time. Elevate legs to the level of the heart and pump ankles as often as possible Elevate leg(s) parallel to the floor when sitting. Off-Loading: Total Contact Cast to Left Lower Extremity - hold cast another week contact cast #3 applied Don't get cast wet Other: - keep pressure off of feet. Medications-Please add to medication list.: [employer] - Keystone gel Topical Antibiotic - AandD ointment to broken down area WOUND #11: - Foot Wound Laterality: Plantar, Right Cleanser: Soap and Water 1 x Per Day/30 Days Discharge Instructions: Gently cleanse wound with antibacterial soap, rinse and pat dry prior to dressing wounds Topical: keystone gel 1 x Per Day/30 Days Primary Dressing: Aquacel Extra Hydrofiber Dressing, 4x5 (in/in) (Generic) 1 x Per Day/30 Days Discharge Instructions: packed into wound Secondary Dressing: ABD Pad 5x9 (in/in) (Generic) 1 x Per Day/30 Days Discharge Instructions: Cover with ABD pad Secured With: Medipore Tape - 84M Medipore H Soft Cloth Surgical Tape, 2x2 (in/yd) (Generic) 1 x  Per Day/30 Days Secured With: [employer] Sterile or Non-Sterile 6-ply 4.5x4 (yd/yd) (Generic) 1 x Per Day/30 Days Discharge Instructions: Apply Kerlix as directed WOUND #12: - Foot Wound Laterality: Plantar, Left, Medial Cleanser:  Soap and Water 1 x Per Day/30 Days Discharge Instructions: Gently cleanse wound with antibacterial soap, rinse and pat dry prior to dressing wounds Topical: Vitamin AandD Ointment, 4(oz) tube 1 x Per Day/30 Days Discharge Instructions: apply to red, broken down area Topical: keystone gel 1 x Per Day/30 Days Discharge Instructions: apply very thin layer to wound Primary Dressing: Aquacel Extra Hydrofiber Dressing, 4x5 (in/in) (Generic) 1 x Per Day/30 Days Discharge Instructions: placed over keystone gel Secondary Dressing: ABD Pad 5x9 (in/in) (Generic) 1 x Per Day/30 Days Discharge Instructions: Cover with ABD pad Secured With: Medipore Tape - 63M Medipore H Soft Cloth Surgical Tape, 2x2 (in/yd) (Generic) 1 x Per Day/30 Days Secured With: Kerlix Roll Sterile or Non-Sterile 6-ply 4.5x4 (yd/yd) (Generic) 1 x Per Day/30 Days Discharge Instructions: Apply Kerlix as directed 1. In office sharp debridement 2. Keystone antibiotic and calcium alginate 3. Follow-up in 1 week 4. Aggressive offloading surgical shoes and offloading inserts Electronic Signature(s) Signed: 06/22/2022 3:18:05 PM By: Owens Shark, Rafaella A. ([id number]) Entered By: Kalman Shan on 06/22/2022 15:07:22 [name] ([id number]) -------------------------------------------------------------------------------- SuperBill Details Patient Name: Heather Dear A. Date of Service: 06/22/2022 Medical Record Number: [id number] Patient Account Number: [id number] Date of Birth/Sex: 04/07/1976 (46 y.o. Female) Treating RN: Cornell Barman Primary Care Provider: Raelene Bott Other Clinician: Massie Kluver Referring Provider: Raelene Bott Treating Provider/Extender: Yaakov Guthrie in Treatment: 14 Diagnosis Coding ICD-10 Codes Code Description [phone] 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ity L97.512 Non-pressure chronic ulcer of other part of right foot with fat  layer exposed E11.621 Type 2 diabetes mellitus with foot ulcer E11.42 Type 2 diabetes mellitus with diabetic polyneuropathy L97.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 of skin Facility Procedures CPT4 Code: [id number] Description: 59292 - DEB SUBQ TISSUE 20 SQ CM/< Modifier: Quantity: 1 CPT4 Code: Description: ICD-10 Diagnosis Description 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 L97.512 Non-pressure chronic ulcer of other part of right foot with fat layer expo Modifier: severity sed Quantity: Physician Procedures CPT4 Code: 4462863 Description: 11042 - WC PHYS SUBQ TISS 20 SQ CM Modifier: Quantity: 1 CPT4 Code: Description: ICD-10 Diagnosis Description 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 L97.512 Non-pressure chronic ulcer of other part of right foot with fat layer expo Modifier: severity sed Quantity: Electronic Signature(s) Signed: 06/22/2022 3:18:05 PM By: Kalman Shan DO Entered By: Kalman Shan on 06/22/2022 15:07:36

## 2022-06-24 NOTE — Progress Notes (Signed)
KIELYN, KARDELL ([id number]) Visit Report for 06/22/2022 Arrival Information Details Patient Name: [name], [name] Date of Service: 06/22/2022 1:30 PM Medical Record Number: [id number] Patient Account Number: [id number] Date of Birth/Sex: 08/29/1976 (46 y.o. Female) Treating RN: Cornell Barman Primary Care Torris House: Raelene Bott Other Clinician: Massie Kluver Referring Flavius Repsher: Raelene Bott Treating Makyla Bye/Extender: Yaakov Guthrie in Treatment: 14 Visit Information History Since Last Visit All ordered tests and consults were completed: No Patient Arrived: Wheel Chair Added or deleted any medications: No Arrival Time: 13:50 Any new allergies or adverse reactions: No Transfer Assistance: EasyPivot Patient Lift Had [name] fall or experienced change in No activities of daily living that may affect risk of falls: Hospitalized since last visit: No Pain Present Now: Yes Electronic Signature(s) Signed: 06/23/2022 4:44:47 PM By: Massie Kluver Entered By: Massie Kluver on 06/22/2022 13:54:13 Huyser, Jazzelle [name]. ([id number]) -------------------------------------------------------------------------------- Clinic Level of Care Assessment Details Patient Name: [name] Date of Service: 06/22/2022 1:30 PM Medical Record Number: [id number] Patient Account Number: [id number] Date of Birth/Sex: 02-18-1976 (46 y.o. Female) Treating RN: Cornell Barman Primary Care Leylani Duley: Raelene Bott Other Clinician: Massie Kluver Referring Aika Brzoska: Raelene Bott Treating Gio Janoski/Extender: Yaakov Guthrie in Treatment: 14 Clinic Level of Care Assessment Items TOOL 1 Quantity Score []  - Use when EandM and Procedure is performed on INITIAL visit 0 ASSESSMENTS - Nursing Assessment / Reassessment []  - General Physical Exam (combine w/ comprehensive assessment (listed just below) when performed on new 0 pt. evals) []  - 0 Comprehensive Assessment (HX, ROS, Risk Assessments, Wounds Hx,  etc.) ASSESSMENTS - Wound and Skin Assessment / Reassessment []  - Dermatologic / Skin Assessment (not related to wound area) 0 ASSESSMENTS - Ostomy and/or Continence Assessment and Care []  - Incontinence Assessment and Management 0 []  - 0 Ostomy Care Assessment and Management (repouching, etc.) PROCESS - Coordination of Care []  - Simple Patient / Family Education for ongoing care 0 []  - 0 Complex (extensive) Patient / Family Education for ongoing care []  - 0 Staff obtains Programmer, systems, Records, Test Results / Process Orders []  - 0 Staff telephones HHA, Nursing Homes / Clarify orders / etc []  - 0 Routine Transfer to another Facility (non-emergent condition) []  - 0 Routine Hospital Admission (non-emergent condition) []  - 0 New Admissions / Biomedical engineer / Ordering NPWT, Apligraf, etc. []  - 0 Emergency Hospital Admission (emergent condition) PROCESS - Special Needs []  - Pediatric / Minor Patient Management 0 []  - 0 Isolation Patient Management []  - 0 Hearing / Language / Visual special needs []  - 0 Assessment of Community assistance (transportation, D/C planning, etc.) []  - 0 Additional assistance / Altered mentation []  - 0 Support Surface(s) Assessment (bed, cushion, seat, etc.) INTERVENTIONS - Miscellaneous []  - External ear exam 0 []  - 0 Patient Transfer (multiple staff / Civil Service fast streamer / Similar devices) []  - 0 Simple Staple / Suture removal (25 or less) []  - 0 Complex Staple / Suture removal (26 or more) []  - 0 Hypo/Hyperglycemic Management (do not check if billed separately) []  - 0 Ankle / Brachial Index (ABI) - do not check if billed separately Has the patient been seen at the hospital within the last three years: Yes Total Score: 0 Level Of Care: ____ [name] ([id number]) Electronic Signature(s) Signed: 06/23/2022 4:44:47 PM By: Massie Kluver Entered By: Massie Kluver on 06/22/2022 14:48:17 Lehmkuhl, Faige AMarland Kitchen  ([id number]) -------------------------------------------------------------------------------- Encounter Discharge Information Details Patient Name: [name] Date of Service: 06/22/2022 1:30 PM Medical Record Number: [id number] Patient Account Number:  [id number] Date of Birth/Sex: Jul 16, 1976 (46 y.o. Female) Treating RN: Cornell Barman Primary Care Akirah Storck: Raelene Bott Other Clinician: Massie Kluver Referring Ramari Bray: Raelene Bott Treating Damein Gaunce/Extender: Yaakov Guthrie in Treatment: 14 Encounter Discharge Information Items Post Procedure Vitals Discharge Condition: Stable Temperature (F): 98.9 Ambulatory Status: Wheelchair Pulse (bpm): 57 Discharge Destination: Home Respiratory Rate (breaths/min): 18 Transportation: Private Auto Blood Pressure (mmHg): 109/57 Accompanied By: mother Schedule Follow-up Appointment: No Clinical Summary of Care: Electronic Signature(s) Signed: 06/23/2022 4:44:47 PM By: Massie Kluver Entered By: Massie Kluver on 06/22/2022 17:10:29 Phang, Hallelujah [name]. ([id number]) -------------------------------------------------------------------------------- Lower Extremity Assessment Details Patient Name: [name] Date of Service: 06/22/2022 1:30 PM Medical Record Number: [id number] Patient Account Number: [id number] Date of Birth/Sex: 06-Jul-1976 (46 y.o. Female) Treating RN: Cornell Barman Primary Care Naika Noto: Raelene Bott Other Clinician: Massie Kluver Referring Lessie Manigo: Raelene Bott Treating Barbarann Kelly/Extender: Yaakov Guthrie in Treatment: 14 Electronic Signature(s) Signed: 06/23/2022 4:44:47 PM By: Massie Kluver Signed: 06/23/2022 6:20:35 PM By: Gretta Cool, BSN, RN, CWS, [name] RN, BSN Entered By: Massie Kluver on 06/22/2022 14:12:05 [name] ([id number]) -------------------------------------------------------------------------------- Multi Wound Chart Details Patient Name: [name] Date of Service:  06/22/2022 1:30 PM Medical Record Number: [id number] Patient Account Number: [id number] Date of Birth/Sex: [birth year] (46 y.o. 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 Comorbid History: Chronic sinus problems/congestion, Chronic sinus problems/congestion, N/[name] Middle ear problems, Anemia, Middle ear problems, Anemia, Chronic Obstructive Pulmonary Chronic Obstructive Pulmonary Disease (COPD), Congestive Heart Disease (COPD), Congestive Heart Failure, Type II Diabetes, End Stage Failure, Type II Diabetes, End Stage Renal Disease, History of pressure Renal Disease, History of pressure wounds, Neuropathy wounds, Neuropathy Date Acquired: 12/01/2021 03/16/2020 N/[name] Weeks of Treatment: 14 14 N/[name] Wound Status: Open Open N/[name] Wound Recurrence: No No N/[name] Measurements L x W x D (cm) 0.5x0.2x0.5 3x1.5x0.2 N/[name] Area (cm) : 0.079 3.534 N/[name] Volume (cm) : 0.039 0.707 N/[name] % Reduction in Area: 95.70% 25.00% N/[name] % Reduction in Volume: 97.70% 24.90% N/[name] Classification: Full Thickness Without Exposed Grade 3 N/[name] Support Structures Exudate Amount: Medium Medium N/[name] Exudate Type: Serosanguineous Serous N/[name] Exudate Color: red, brown amber N/[name] Wound Margin: Distinct, outline attached Flat and Intact N/[name] Granulation Amount: Medium (34-66%) Large (67-100%) N/[name] Granulation Quality: Pink, Pale Red, Pink N/[name] Necrotic Amount: Medium (34-66%) Small (1-33%) N/[name] Exposed  Structures: Fat Layer (Subcutaneous Tissue): Fat Layer (Subcutaneous Tissue): N/[name] Yes Yes Fascia: No Fascia: No Tendon: No Tendon: No Muscle: No Muscle: No Joint: No Joint: No Bone: No Bone: No Epithelialization: Small (1-33%) Small (1-33%) N/[name] Treatment Notes MING, KUNKA [name]. ([id number]) Electronic Signature(s) Signed: 06/23/2022 4:44:47 PM By: Massie Kluver Entered By: Massie Kluver on 06/22/2022 14:12:19 [name] ([id number]) -------------------------------------------------------------------------------- [place] Details Patient Name: [name] Date of Service: 06/22/2022 1:30 PM Medical Record Number: [id number] Patient Account Number: [id number] Date of Birth/Sex: 1976/09/25 (46 y.o. Female) Treating RN: Cornell Barman Primary Care Tiaja Hagan: Raelene Bott Other Clinician: Massie Kluver Referring Delwyn Scoggin: Raelene Bott Treating Tamla Winkels/Extender: Yaakov Guthrie in Treatment: 14 Active Inactive Abuse / Safety / Falls / Self Care Management Nursing Diagnoses: History of Falls Potential for  falls Potential for injury related to falls Goals: Patient will not develop complications from immobility Date Initiated: 03/17/2022 Date Inactivated: 04/20/2022 Target Resolution Date: 03/16/2022 Goal Status: Met Patient/caregiver will verbalize understanding of skin care regimen Date Initiated: 03/17/2022 Target Resolution Date: 03/16/2022 Goal Status: Active Patient/caregiver will verbalize/demonstrate measure taken to improve self care Date Initiated: 03/17/2022 Target Resolution Date: 03/16/2022 Goal Status: Active Interventions: Assess fall risk on admission and as needed Provide education on basic hygiene Provide education on personal and home safety Notes: Necrotic Tissue Nursing Diagnoses: Impaired tissue integrity related to necrotic/devitalized tissue Knowledge deficit related to management of necrotic/devitalized  tissue Goals: Necrotic/devitalized tissue will be minimized in the wound bed Date Initiated: 03/17/2022 Date Inactivated: 05/04/2022 Target Resolution Date: 03/16/2022 Goal Status: Met Patient/caregiver will verbalize understanding of reason and process for debridement of necrotic tissue Date Initiated: 03/17/2022 Target Resolution Date: 03/16/2022 Goal Status: Active Interventions: Provide education on necrotic tissue and debridement process Treatment Activities: Apply topical anesthetic as ordered : 03/16/2022 Excisional debridement : 03/16/2022 Notes: Osteomyelitis Nursing Diagnoses: Infection: osteomyelitis Knowledge deficit related to disease process and management KAMARIAH, FRUCHTER ([id number]) Goals: Patient/caregiver will verbalize understanding of disease process and disease management Date Initiated: 03/17/2022 Date Inactivated: 04/20/2022 Target Resolution Date: 03/16/2022 Goal Status: Met Patient's osteomyelitis will resolve Date Initiated: 03/17/2022 Target Resolution Date: 03/16/2022 Goal Status: Active Signs and symptoms for osteomyelitis will be recognized and promptly addressed Date Initiated: 03/17/2022 Target Resolution Date: 03/16/2022 Goal Status: Active Interventions: Assess for signs and symptoms of osteomyelitis resolution every visit Provide education on osteomyelitis Treatment Activities: Systemic antibiotics : 03/16/2022 Notes: Wound/Skin Impairment Nursing Diagnoses: Impaired tissue integrity Knowledge deficit related to smoking impact on wound healing Knowledge deficit related to ulceration/compromised skin integrity Goals: Patient/caregiver will verbalize understanding of skin care regimen Date Initiated: 03/17/2022 Date Inactivated: 04/20/2022 Target Resolution Date: 03/16/2022 Goal Status: Met Ulcer/skin breakdown will have [name] volume reduction of 30% by week 4 Date Initiated: 03/17/2022 Target Resolution Date: 04/13/2022 Goal Status:  Active Ulcer/skin breakdown will have [name] volume reduction of 50% by week 8 Date Initiated: 03/17/2022 Target Resolution Date: 05/04/2022 Goal Status: Active Ulcer/skin breakdown will have [name] volume reduction of 80% by week 12 Date Initiated: 03/17/2022 Target Resolution Date: 06/01/2022 Goal Status: Active Ulcer/skin breakdown will heal within 14 weeks Date Initiated: 03/17/2022 Target Resolution Date: 06/15/2022 Goal Status: Active Interventions: Assess ulceration(s) every visit Provide education on ulcer and skin care Treatment Activities: Skin care regimen initiated : 03/16/2022 Notes: Electronic Signature(s) Signed: 06/23/2022 4:44:47 PM By: Massie Kluver Signed: 06/23/2022 6:20:35 PM By: Gretta Cool, BSN, RN, CWS, [name] RN, BSN Entered By: Massie Kluver on 06/22/2022 14:12:10 Barre, Keali [name]. ([id number]) -------------------------------------------------------------------------------- Pain Assessment Details Patient Name: [name] Date of Service: 06/22/2022 1:30 PM Medical Record Number: [id number] Patient Account Number: [id number] Date of Birth/Sex: Jun 12, 1976 (46 y.o. Female) Treating RN: Cornell Barman Primary Care Burlon Centrella: Raelene Bott Other Clinician: Massie Kluver Referring Vanessia Bokhari: Raelene Bott Treating Jaquille Kau/Extender: Yaakov Guthrie in Treatment: 14 Active Problems Location of Pain Severity and Description of Pain Patient Has Paino Yes Site Locations Pain Location: Pain in Ulcers Duration of the Pain. Constant / Intermittento Constant Rate the pain. Current Pain Level: 5 Character of Pain Describe the Pain: Aching, Burning, Sharp Pain Management and Medication Current Pain Management: Medication: Yes Rest: Yes Electronic Signature(s) Signed: 06/23/2022 4:44:47 PM By: Massie Kluver Signed: 06/23/2022 6:20:35 PM By: Gretta Cool, BSN, RN, CWS, [name] RN, BSN Entered By: Massie Kluver on 06/22/2022 13:58:56 Hengst, Nimsi [name].  ([id number]) --------------------------------------------------------------------------------  Patient/Caregiver Education Details Patient Name: [name], [name] Date of Service: 06/22/2022 1:30 PM Medical Record Number: [id number] Patient Account Number: [id number] Date of Birth/Gender: 1976-10-23 (46 y.o. Female) Treating RN: Cornell Barman Primary Care Physician: Raelene Bott Other Clinician: Massie Kluver Referring Physician: Raelene Bott Treating Physician/Extender: Yaakov Guthrie in Treatment: 14 Education Assessment Education Provided To: Patient Education Topics Provided Wound/Skin Impairment: Handouts: Other: continue wound care as directed Methods: Explain/Verbal Responses: State content correctly Electronic Signature(s) Signed: 06/23/2022 4:44:47 PM By: Massie Kluver Entered By: Massie Kluver on 06/22/2022 14:48:52 Swoboda, Maranatha [name]. ([id number]) -------------------------------------------------------------------------------- Wound Assessment Details Patient Name: [name] Date of Service: 06/22/2022 1:30 PM Medical Record Number: [id number] Patient Account Number: [id number] Date of Birth/Sex: 05-26-1976 (46 y.o. Female) Treating RN: Cornell Barman Primary Care Julian Medina: Raelene Bott Other Clinician: Massie Kluver Referring Avamae Dehaan: Raelene Bott Treating Jelene Albano/Extender: Yaakov Guthrie in Treatment: 14 Wound Status Wound Number: 11 Primary Open Surgical Wound Etiology: Wound Location: Right, Plantar Foot Wound Open Wounding Event: Surgical Injury Status: Date Acquired: 12/01/2021 Comorbid Chronic sinus problems/congestion, Middle ear problems, Weeks Of Treatment: 14 History: Anemia, Chronic Obstructive Pulmonary Disease (COPD), Clustered Wound: No Congestive Heart Failure, Type II Diabetes, End Stage Renal Disease, History of pressure wounds, Neuropathy Photos Wound Measurements Length: (cm) 0.5 Width: (cm) 0.2 Depth: (cm)  0.5 Area: (cm) 0.079 Volume: (cm) 0.039 % Reduction in Area: 95.7% % Reduction in Volume: 97.7% Epithelialization: Small (1-33%) Wound Description Classification: Full Thickness Without Exposed Support Structures Wound Margin: Distinct, outline attached Exudate Amount: Medium Exudate Type: Serosanguineous Exudate Color: red, brown Foul Odor After Cleansing: No Slough/Fibrino Yes Wound Bed Granulation Amount: Medium (34-66%) Exposed Structure Granulation Quality: Pink, Pale Fascia Exposed: No Necrotic Amount: Medium (34-66%) Fat Layer (Subcutaneous Tissue) Exposed: Yes Necrotic Quality: Adherent Slough Tendon Exposed: No Muscle Exposed: No Joint Exposed: No Bone Exposed: No Treatment Notes Wound #11 (Foot) Wound Laterality: Plantar, Right Cleanser Soap and Water Discharge Instruction: Gently cleanse wound with antibacterial soap, rinse and pat dry prior to dressing wounds Morad, Avriel [name]. ([id number]) Peri-Wound Care Topical keystone gel Primary Dressing Aquacel Extra Hydrofiber Dressing, 4x5 (in/in) Discharge Instruction: packed into wound Secondary Dressing ABD Pad 5x9 (in/in) Discharge Instruction: Cover with ABD pad Secured With Medipore Tape - 70M Medipore H Soft Cloth Surgical Tape, 2x2 (in/yd) Kerlix Roll Sterile or Non-Sterile 6-ply 4.5x4 (yd/yd) Discharge Instruction: Apply Kerlix as directed Compression Wrap Compression Stockings Add-Ons Electronic Signature(s) Signed: 06/23/2022 4:44:47 PM By: Massie Kluver Signed: 06/23/2022 6:20:35 PM By: Gretta Cool, BSN, RN, CWS, [name] RN, BSN Entered By: Massie Kluver on 06/22/2022 Deltana, [place]. ([id number]) -------------------------------------------------------------------------------- Wound Assessment Details Patient Name: [name] Date of Service: 06/22/2022 1:30 PM Medical Record Number: [id number] Patient Account Number: [id number] Date of Birth/Sex: [birth year] (46 y.o. Female) Treating RN:  Cornell Barman Primary Care Starr Engel: Raelene Bott Other Clinician: Massie Kluver Referring Lathon Adan: Raelene Bott Treating Gardenia Witter/Extender: Yaakov Guthrie in Treatment: 14 Wound Status Wound Number: 12 Primary Diabetic Wound/Ulcer of the Lower Extremity Etiology: Wound Location: Left, Medial, Plantar Foot Wound Open Wounding Event: Pressure Injury Status: Date Acquired: 03/16/2020 Comorbid Chronic sinus problems/congestion, Middle ear problems, Weeks Of Treatment: 14 History: Anemia, Chronic Obstructive Pulmonary Disease (COPD), Clustered Wound: No Congestive Heart Failure, Type II Diabetes, End Stage Renal Disease, History of pressure wounds, Neuropathy Photos Wound Measurements Length: (cm) 3 Width: (cm) 1.5 Depth: (cm) 0.2 Area: (cm) 3.534 Volume: (cm) 0.707 % Reduction in Area: 25% % Reduction in Volume: 24.9% Epithelialization: Small (1-33%) Wound Description  Classification: Grade 3 Wound Margin: Flat and Intact Exudate Amount: Medium Exudate Type: Serous Exudate Color: amber Foul Odor After Cleansing: No Slough/Fibrino Yes Wound Bed Granulation Amount: Large (67-100%) Exposed Structure Granulation Quality: Red, Pink Fascia Exposed: No Necrotic Amount: Small (1-33%) Fat Layer (Subcutaneous Tissue) Exposed: Yes Necrotic Quality: Adherent Slough Tendon Exposed: No Muscle Exposed: No Joint Exposed: No Bone Exposed: No Treatment Notes Wound #12 (Foot) Wound Laterality: Plantar, Left, Medial Cleanser Soap and Water Discharge Instruction: Gently cleanse wound with antibacterial soap, rinse and pat dry prior to dressing wounds Lerner, Bettejane [name]. ([id number]) Peri-Wound Care Topical Vitamin AandD Ointment, 4(oz) tube Discharge Instruction: apply to red, broken down area keystone gel Discharge Instruction: apply very thin layer to wound Primary Dressing Aquacel Extra Hydrofiber Dressing, 4x5 (in/in) Discharge Instruction: placed over keystone  gel Secondary Dressing ABD Pad 5x9 (in/in) Discharge Instruction: Cover with ABD pad Secured With Medipore Tape - 68M Medipore H Soft Cloth Surgical Tape, 2x2 (in/yd) Kerlix Roll Sterile or Non-Sterile 6-ply 4.5x4 (yd/yd) Discharge Instruction: Apply Kerlix as directed Compression Wrap Compression Stockings Add-Ons Electronic Signature(s) Signed: 06/23/2022 4:44:47 PM By: Massie Kluver Signed: 06/23/2022 6:20:35 PM By: Gretta Cool, BSN, RN, CWS, [name] RN, BSN Entered By: Massie Kluver on 06/22/2022 14:11:54 Monsivais, Kitti [name]. ([id number]) -------------------------------------------------------------------------------- [place] Details Patient Name: [name] Date of Service: 06/22/2022 1:30 PM Medical Record Number: [id number] Patient Account Number: [id number] Date of Birth/Sex: 1976-07-15 (46 y.o. Female) Treating RN: Cornell Barman Primary Care Marsel Gail: Raelene Bott Other Clinician: Massie Kluver Referring Vicktoria Muckey: Raelene Bott Treating Brantlee Hinde/Extender: Yaakov Guthrie in Treatment: 14 Vital Signs Time Taken: 13:54 Temperature (F): 98.9 Pulse (bpm): 57 Respiratory Rate (breaths/min): 18 Blood Pressure (mmHg): 109/57 Reference Range: 80 - 120 mg / dl Electronic Signature(s) Signed: 06/23/2022 4:44:47 PM By: Massie Kluver Entered By: Massie Kluver on 06/22/2022 13:58:44

## 2022-06-29 ENCOUNTER — Encounter: Payer: Medicaid Other | Attending: Internal Medicine | Admitting: Internal Medicine

## 2022-06-29 DIAGNOSIS — E11621 Type 2 diabetes mellitus with foot ulcer: Secondary | ICD-10-CM | POA: Insufficient documentation

## 2022-06-29 DIAGNOSIS — E11622 Type 2 diabetes mellitus with other skin ulcer: Secondary | ICD-10-CM | POA: Diagnosis not present

## 2022-06-29 DIAGNOSIS — E1142 Type 2 diabetes mellitus with diabetic polyneuropathy: Secondary | ICD-10-CM | POA: Diagnosis not present

## 2022-06-29 DIAGNOSIS — L84 Corns and callosities: Secondary | ICD-10-CM | POA: Diagnosis not present

## 2022-06-29 DIAGNOSIS — L97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ity: Secondary | ICD-10-CM | POA: Diagnosis not present

## 2022-06-29 DIAGNOSIS — L97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 of skin: Secondary | ICD-10-CM | POA: Insufficient documentation

## 2022-06-29 DIAGNOSIS — L97512 Non-pressure chronic ulcer of other part of right foot with fat layer exposed: Secondary | ICD-10-CM | POA: Insufficient documentation

## 2022-06-30 NOTE — Progress Notes (Signed)
Heather, [name] ([id number]) Visit Report for 06/29/2022 Arrival Information Details Patient Name: [name], [name] Date of Service: 06/29/2022 10:30 AM Medical Record Number: [id number] Patient Account Number: [id number] Date of Birth/Sex: 1976/01/05 (46 y.o. F) Treating RN: Cornell Barman Primary Care Reon Hunley: Raelene Bott Other Clinician: Massie Kluver Referring Ovila Lepage: Raelene Bott Treating Idan Prime/Extender: Yaakov Guthrie in Treatment: 15 Visit Information History Since Last Visit All ordered tests and consults were completed: No Patient Arrived: Wheel Chair Added or deleted any medications: No Arrival Time: 10:25 Any new allergies or adverse reactions: No Transfer Assistance: None Had a fall or experienced change in No activities of daily living that may affect risk of falls: Hospitalized since last visit: No Pain Present Now: Yes Electronic Signature(s) Signed: 06/29/2022 5:12:58 PM By: Massie Kluver Entered By: Massie Kluver on 06/29/2022 10:33:01 [name] ([id number]) -------------------------------------------------------------------------------- Clinic Level of Care Assessment Details Patient Name: Heather Dear A. Date of Service: 06/29/2022 10:30 AM Medical Record Number: [id number] Patient Account Number: [id number] Date of Birth/Sex: Sep 12, 1976 (46 y.o. F) Treating RN: Cornell Barman Primary Care Clarabell Matsuoka: Raelene Bott Other Clinician: Massie Kluver Referring Brunilda Eble: Raelene Bott Treating Roshawn Ayala/Extender: Yaakov Guthrie in Treatment: 15 Clinic Level of Care Assessment Items TOOL 1 Quantity Score []  - Use when EandM and Procedure is performed on INITIAL visit 0 ASSESSMENTS - Nursing Assessment / Reassessment []  - General Physical Exam (combine w/ comprehensive assessment (listed just below) when performed on new 0 pt. evals) []  - 0 Comprehensive Assessment (HX, ROS, Risk Assessments, Wounds Hx, etc.) ASSESSMENTS - Wound and  Skin Assessment / Reassessment []  - Dermatologic / Skin Assessment (not related to wound area) 0 ASSESSMENTS - Ostomy and/or Continence Assessment and Care []  - Incontinence Assessment and Management 0 []  - 0 Ostomy Care Assessment and Management (repouching, etc.) PROCESS - Coordination of Care []  - Simple Patient / Family Education for ongoing care 0 []  - 0 Complex (extensive) Patient / Family Education for ongoing care []  - 0 Staff obtains Programmer, systems, Records, Test Results / Process Orders []  - 0 Staff telephones HHA, Nursing Homes / Clarify orders / etc []  - 0 Routine Transfer to another Facility (non-emergent condition) []  - 0 Routine Hospital Admission (non-emergent condition) []  - 0 New Admissions / Biomedical engineer / Ordering NPWT, Apligraf, etc. []  - 0 Emergency Hospital Admission (emergent condition) PROCESS - Special Needs []  - Pediatric / Minor Patient Management 0 []  - 0 Isolation Patient Management []  - 0 Hearing / Language / Visual special needs []  - 0 Assessment of Community assistance (transportation, D/C planning, etc.) []  - 0 Additional assistance / Altered mentation []  - 0 Support Surface(s) Assessment (bed, cushion, seat, etc.) INTERVENTIONS - Miscellaneous []  - External ear exam 0 []  - 0 Patient Transfer (multiple staff / Civil Service fast streamer / Similar devices) []  - 0 Simple Staple / Suture removal (25 or less) []  - 0 Complex Staple / Suture removal (26 or more) []  - 0 Hypo/Hyperglycemic Management (do not check if billed separately) []  - 0 Ankle / Brachial Index (ABI) - do not check if billed separately Has the patient been seen at the hospital within the last three years: Yes Total Score: 0 Level Of Care: ____ [name] ([id number]) Electronic Signature(s) Signed: 06/29/2022 5:12:58 PM By: Massie Kluver Entered By: Massie Kluver on 06/29/2022 11:02:23 [name]  ([id number]) -------------------------------------------------------------------------------- Encounter Discharge Information Details Patient Name: Heather Dear A. Date of Service: 06/29/2022 10:30 AM Medical Record Number: [id number] Patient Account Number: [id number] Date  of Birth/Sex: 08-20-1976 (46 y.o. F) Treating RN: Cornell Barman Primary Care Santiago Graf: Raelene Bott Other Clinician: Massie Kluver Referring Danine Hor: Raelene Bott Treating Austyn Seier/Extender: Yaakov Guthrie in Treatment: 15 Encounter Discharge Information Items Post Procedure Vitals Discharge Condition: Stable Temperature (F): 98.5 Ambulatory Status: Wheelchair Pulse (bpm): 51 Discharge Destination: Home Respiratory Rate (breaths/min): 16 Transportation: Private Auto Blood Pressure (mmHg): 115/76 Accompanied By: mother Schedule Follow-up Appointment: No Clinical Summary of Care: Electronic Signature(s) Signed: 06/29/2022 5:12:58 PM By: Massie Kluver Entered By: Massie Kluver on 06/29/2022 11:28:40 Jeansonne, Seaira A. ([id number]) -------------------------------------------------------------------------------- Lower Extremity Assessment Details Patient Name: Heather Dear A. Date of Service: 06/29/2022 10:30 AM Medical Record Number: [id number] Patient Account Number: [id number] Date of Birth/Sex: Dec 21, 1975 (46 y.o. F) Treating RN: Cornell Barman Primary Care [name]: Raelene Bott Other Clinician: Massie Kluver Referring [name]: Raelene Bott Treating Drue Harr/Extender: Yaakov Guthrie in Treatment: 15 Electronic Signature(s) Signed: 06/29/2022 5:12:58 PM By: Massie Kluver Signed: 06/30/2022 9:40:40 AM By: Gretta Cool, BSN, RN, CWS, [name] RN, BSN Entered By: Massie Kluver on 06/29/2022 10:50:36 [name] ([id number]) -------------------------------------------------------------------------------- Multi Wound Chart Details Patient Name: Heather Dear A. Date of Service: 06/29/2022 10:30  AM Medical Record Number: [id number] Patient Account Number: [id number] Date of Birth/Sex: 09-May-1976 (46 y.o. 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 Comorbid History: Chronic sinus problems/congestion, Chronic sinus problems/congestion, N/A Middle ear problems, Anemia, Middle ear problems, Anemia, Chronic Obstructive Pulmonary Chronic Obstructive Pulmonary Disease (COPD), Congestive Heart Disease (COPD), Congestive Heart Failure, Type II Diabetes, End Stage Failure, Type II Diabetes, End Stage Renal Disease, History of pressure Renal Disease, History of pressure wounds, Neuropathy wounds, Neuropathy Date Acquired: 12/01/2021 03/16/2020 N/A Weeks of Treatment: 15 15 N/A Wound Status: Open Open N/A Wound Recurrence: No No N/A Measurements L x W x D (cm) 0.7x0.4x0.5 3.6x1.6x0.1 N/A Area (cm) : 0.22 4.524 N/A Volume (cm) : 0.11 0.452 N/A % Reduction in Area: 88.10% 4.00% N/A % Reduction in Volume: 93.40% 52.00% N/A Classification: Full Thickness Without Exposed Grade 3 N/A Support Structures Exudate Amount: Medium Medium N/A Exudate Type: Serosanguineous Serous N/A Exudate Color: red, brown amber N/A Wound Margin: Distinct, outline attached Flat and Intact N/A Granulation Amount: Medium (34-66%) Large (67-100%) N/A Granulation Quality: Pink, Pale Red, Pink N/A Necrotic Amount: Medium (34-66%) Small (1-33%) N/A Exposed  Structures: Fat Layer (Subcutaneous Tissue): Fat Layer (Subcutaneous Tissue): N/A Yes Yes Fascia: No Fascia: No Tendon: No Tendon: No Muscle: No Muscle: No Joint: No Joint: No Bone: No Bone: No Epithelialization: Small (1-33%) Small (1-33%) N/A Treatment Notes TAJAI, SUDER A. ([id number]) Electronic Signature(s) Signed: 06/29/2022 5:12:58 PM By: Massie Kluver Entered By: Massie Kluver on 06/29/2022 10:50:55 [name] ([id number]) -------------------------------------------------------------------------------- [place] Details Patient Name: Heather Dear A. Date of Service: 06/29/2022 10:30 AM Medical Record Number: [id number] Patient Account Number: [id number] Date of Birth/Sex: November 12, 1975 (46 y.o. F) Treating RN: Cornell Barman Primary Care Blia Totman: Raelene Bott Other Clinician: Massie Kluver Referring Brant Peets: Raelene Bott Treating Jeanine Caven/Extender: Yaakov Guthrie in Treatment: 15 Active Inactive Abuse / Safety / Falls / Self Care Management Nursing Diagnoses: History of Falls Potential  for falls Potential for injury related to falls Goals: Patient will not develop complications from immobility Date Initiated: 03/17/2022 Date Inactivated: 04/20/2022 Target Resolution Date: 03/16/2022 Goal Status: Met Patient/caregiver will verbalize understanding of skin care regimen Date Initiated: 03/17/2022 Target Resolution Date: 03/16/2022 Goal Status: Active Patient/caregiver will verbalize/demonstrate measure taken to improve self care Date Initiated: 03/17/2022 Target Resolution Date: 03/16/2022 Goal Status: Active Interventions: Assess fall risk on admission and as needed Provide education on basic hygiene Provide education on personal and home safety Notes: Necrotic Tissue Nursing Diagnoses: Impaired tissue integrity related to necrotic/devitalized tissue Knowledge deficit related to management of necrotic/devitalized  tissue Goals: Necrotic/devitalized tissue will be minimized in the wound bed Date Initiated: 03/17/2022 Date Inactivated: 05/04/2022 Target Resolution Date: 03/16/2022 Goal Status: Met Patient/caregiver will verbalize understanding of reason and process for debridement of necrotic tissue Date Initiated: 03/17/2022 Target Resolution Date: 03/16/2022 Goal Status: Active Interventions: Provide education on necrotic tissue and debridement process Treatment Activities: Apply topical anesthetic as ordered : 03/16/2022 Excisional debridement : 03/16/2022 Notes: Osteomyelitis Nursing Diagnoses: Infection: osteomyelitis Knowledge deficit related to disease process and management ONDA, KATTNER ([id number]) Goals: Patient/caregiver will verbalize understanding of disease process and disease management Date Initiated: 03/17/2022 Date Inactivated: 04/20/2022 Target Resolution Date: 03/16/2022 Goal Status: Met Patient's osteomyelitis will resolve Date Initiated: 03/17/2022 Target Resolution Date: 03/16/2022 Goal Status: Active Signs and symptoms for osteomyelitis will be recognized and promptly addressed Date Initiated: 03/17/2022 Target Resolution Date: 03/16/2022 Goal Status: Active Interventions: Assess for signs and symptoms of osteomyelitis resolution every visit Provide education on osteomyelitis Treatment Activities: Systemic antibiotics : 03/16/2022 Notes: Wound/Skin Impairment Nursing Diagnoses: Impaired tissue integrity Knowledge deficit related to smoking impact on wound healing Knowledge deficit related to ulceration/compromised skin integrity Goals: Patient/caregiver will verbalize understanding of skin care regimen Date Initiated: 03/17/2022 Date Inactivated: 04/20/2022 Target Resolution Date: 03/16/2022 Goal Status: Met Ulcer/skin breakdown will have a volume reduction of 30% by week 4 Date Initiated: 03/17/2022 Target Resolution Date: 04/13/2022 Goal Status:  Active Ulcer/skin breakdown will have a volume reduction of 50% by week 8 Date Initiated: 03/17/2022 Target Resolution Date: 05/04/2022 Goal Status: Active Ulcer/skin breakdown will have a volume reduction of 80% by week 12 Date Initiated: 03/17/2022 Target Resolution Date: 06/01/2022 Goal Status: Active Ulcer/skin breakdown will heal within 14 weeks Date Initiated: 03/17/2022 Target Resolution Date: 06/15/2022 Goal Status: Active Interventions: Assess ulceration(s) every visit Provide education on ulcer and skin care Treatment Activities: Skin care regimen initiated : 03/16/2022 Notes: Electronic Signature(s) Signed: 06/29/2022 5:12:58 PM By: Massie Kluver Signed: 06/30/2022 9:40:40 AM By: Gretta Cool, BSN, RN, CWS, [name] RN, BSN Entered By: Massie Kluver on 06/29/2022 10:50:42 [name] ([id number]) -------------------------------------------------------------------------------- Pain Assessment Details Patient Name: Heather Dear A. Date of Service: 06/29/2022 10:30 AM Medical Record Number: [id number] Patient Account Number: [id number] Date of Birth/Sex: Dec 13, 1975 (46 y.o. F) Treating RN: Cornell Barman Primary Care Theodora Lalanne: Raelene Bott Other Clinician: Massie Kluver Referring Rakesha Dalporto: Raelene Bott Treating Bexton Haak/Extender: Yaakov Guthrie in Treatment: 15 Active Problems Location of Pain Severity and Description of Pain Patient Has Paino Yes Site Locations Pain Location: Pain in Ulcers Duration of the Pain. Constant / Intermittento Constant Rate the pain. Current Pain Level: 5 Character of Pain Describe the Pain: Aching, Burning Pain Management and Medication Current Pain Management: Medication: Yes Rest: Yes Electronic Signature(s) Signed: 06/29/2022 5:12:58 PM By: Massie Kluver Signed: 06/30/2022 9:40:40 AM By: Gretta Cool, BSN, RN, CWS, [name] RN, BSN Entered By: Massie Kluver on 06/29/2022 10:40:24 Willert, Takiah A.  ([id number]) --------------------------------------------------------------------------------  Patient/Caregiver Education Details Patient Name: CYDNE, GRAHN A. Date of Service: 06/29/2022 10:30 AM Medical Record Number: [id number] Patient Account Number: [id number] Date of Birth/Gender: [birth year] (46 y.o. F) Treating RN: Cornell Barman Primary Care Physician: Raelene Bott Other Clinician: Massie Kluver Referring Physician: Raelene Bott Treating Physician/Extender: Yaakov Guthrie in Treatment: 15 Education Assessment Education Provided To: Patient Education Topics Provided Wound/Skin Impairment: Handouts: Other: continue wound care as directed Methods: Explain/Verbal Responses: State content correctly Electronic Signature(s) Signed: 06/29/2022 5:12:58 PM By: Massie Kluver Entered By: Massie Kluver on 06/29/2022 11:27:46 Klinck, Lavanna A. ([id number]) -------------------------------------------------------------------------------- Wound Assessment Details Patient Name: Heather Dear A. Date of Service: 06/29/2022 10:30 AM Medical Record Number: [id number] Patient Account Number: [id number] Date of Birth/Sex: Feb 05, 1976 (46 y.o. F) Treating RN: Cornell Barman Primary Care Mariaeduarda Defranco: Raelene Bott Other Clinician: Massie Kluver Referring Lindon Kiel: Raelene Bott Treating Karter Haire/Extender: Yaakov Guthrie in Treatment: 15 Wound Status Wound Number: 11 Primary Open Surgical Wound Etiology: Wound Location: Right, Plantar Foot Wound Open Wounding Event: Surgical Injury Status: Date Acquired: 12/01/2021 Comorbid Chronic sinus problems/congestion, Middle ear problems, Weeks Of Treatment: 15 History: Anemia, Chronic Obstructive Pulmonary Disease (COPD), Clustered Wound: No Congestive Heart Failure, Type II Diabetes, End Stage Renal Disease, History of pressure wounds, Neuropathy Photos Wound Measurements Length: (cm) 0.7 Width: (cm) 0.4 Depth: (cm) 0.5 Area: (cm)  0.22 Volume: (cm) 0.11 % Reduction in Area: 88.1% % Reduction in Volume: 93.4% Epithelialization: Small (1-33%) Wound Description Classification: Full Thickness Without Exposed Support Structures Wound Margin: Distinct, outline attached Exudate Amount: Medium Exudate Type: Serosanguineous Exudate Color: red, brown Foul Odor After Cleansing: No Slough/Fibrino Yes Wound Bed Granulation Amount: Medium (34-66%) Exposed Structure Granulation Quality: Pink, Pale Fascia Exposed: No Necrotic Amount: Medium (34-66%) Fat Layer (Subcutaneous Tissue) Exposed: Yes Necrotic Quality: Adherent Slough Tendon Exposed: No Muscle Exposed: No Joint Exposed: No Bone Exposed: No Treatment Notes Wound #11 (Foot) Wound Laterality: Plantar, Right Cleanser Soap and Water Discharge Instruction: Gently cleanse wound with antibacterial soap, rinse and pat dry prior to dressing wounds Naas, Yamaris A. ([id number]) Peri-Wound Care Topical keystone gel Primary Dressing Aquacel Extra Hydrofiber Dressing, 2x2 (in/in) Secondary Dressing ABD Pad 5x9 (in/in) Discharge Instruction: Cover with ABD pad Secured With Medipore Tape - 68M Medipore H Soft Cloth Surgical Tape, 2x2 (in/yd) Kerlix Roll Sterile or Non-Sterile 6-ply 4.5x4 (yd/yd) Discharge Instruction: Apply Kerlix as directed Compression Wrap Compression Stockings Add-Ons Electronic Signature(s) Signed: 06/29/2022 5:12:58 PM By: Massie Kluver Signed: 06/30/2022 9:40:40 AM By: Gretta Cool, BSN, RN, CWS, [name] RN, BSN Entered By: Massie Kluver on 06/29/2022 10:49:48 Petterson, Nelissa A. ([id number]) -------------------------------------------------------------------------------- Wound Assessment Details Patient Name: Heather Dear A. Date of Service: 06/29/2022 10:30 AM Medical Record Number: [id number] Patient Account Number: [id number] Date of Birth/Sex: 07/16/1976 (46 y.o. F) Treating RN: Cornell Barman Primary Care Berlinda Farve: Raelene Bott Other Clinician:  Massie Kluver Referring Zadyn Yardley: Raelene Bott Treating Filmore Molyneux/Extender: Yaakov Guthrie in Treatment: 15 Wound Status Wound Number: 12 Primary Diabetic Wound/Ulcer of the Lower Extremity Etiology: Wound Location: Left, Medial, Plantar Foot Wound Open Wounding Event: Pressure Injury Status: Date Acquired: 03/16/2020 Comorbid Chronic sinus problems/congestion, Middle ear problems, Weeks Of Treatment: 15 History: Anemia, Chronic Obstructive Pulmonary Disease (COPD), Clustered Wound: No Congestive Heart Failure, Type II Diabetes, End Stage Renal Disease, History of pressure wounds, Neuropathy Photos Wound Measurements Length: (cm) 3.6 Width: (cm) 1.6 Depth: (cm) 0.1 Area: (cm) 4.524 Volume: (cm) 0.452 % Reduction in Area: 4% % Reduction in Volume: 52% Epithelialization: Small (1-33%) Wound Description Classification: Grade 3 Wound Margin:  Flat and Intact Exudate Amount: Medium Exudate Type: Serous Exudate Color: amber Foul Odor After Cleansing: No Slough/Fibrino Yes Wound Bed Granulation Amount: Large (67-100%) Exposed Structure Granulation Quality: Red, Pink Fascia Exposed: No Necrotic Amount: Small (1-33%) Fat Layer (Subcutaneous Tissue) Exposed: Yes Necrotic Quality: Adherent Slough Tendon Exposed: No Muscle Exposed: No Joint Exposed: No Bone Exposed: No Treatment Notes Wound #12 (Foot) Wound Laterality: Plantar, Left, Medial Cleanser Soap and Water Discharge Instruction: Gently cleanse wound with antibacterial soap, rinse and pat dry prior to dressing wounds Bonnell, Jeremy A. ([id number]) Peri-Wound Care Topical Vitamin AandD Ointment, 4(oz) tube Discharge Instruction: apply to red, broken down area keystone gel Discharge Instruction: apply very thin layer to wound Primary Dressing Hydrofera Blue Ready Transfer Foam, 2.5x2.5 (in/in) Discharge Instruction: Apply Hydrofera Blue Ready to wound bed as directed Secondary Dressing ABD Pad 5x9  (in/in) Discharge Instruction: Cover with ABD pad Secured With Medipore Tape - 26M Medipore H Soft Cloth Surgical Tape, 2x2 (in/yd) Kerlix Roll Sterile or Non-Sterile 6-ply 4.5x4 (yd/yd) Discharge Instruction: Apply Kerlix as directed Compression Wrap Compression Stockings Add-Ons Electronic Signature(s) Signed: 06/29/2022 5:12:58 PM By: Massie Kluver Signed: 06/30/2022 9:40:40 AM By: Gretta Cool, BSN, RN, CWS, [name] RN, BSN Entered By: Massie Kluver on 06/29/2022 10:50:26 [name] ([id number]) -------------------------------------------------------------------------------- [place] Details Patient Name: Heather Dear A. Date of Service: 06/29/2022 10:30 AM Medical Record Number: [id number] Patient Account Number: [id number] Date of Birth/Sex: 04-06-1976 (46 y.o. F) Treating RN: Cornell Barman Primary Care Anicia Leuthold: Raelene Bott Other Clinician: Massie Kluver Referring Laiklynn Raczynski: Raelene Bott Treating Anina Schnake/Extender: Yaakov Guthrie in Treatment: 15 Vital Signs Time Taken: 10:33 Temperature (F): 98.5 Height (in): 69 Pulse (bpm): 59 Weight (lbs): 178 Respiratory Rate (breaths/min): 16 Body Mass Index (BMI): 26.3 Blood Pressure (mmHg): 115/76 Reference Range: 80 - 120 mg / dl Electronic Signature(s) Signed: 06/29/2022 5:12:58 PM By: Massie Kluver Entered By: Massie Kluver on 06/29/2022 10:40:09

## 2022-06-30 NOTE — Progress Notes (Signed)
[name] ([id number]) Visit Report for 06/29/2022 Chief Complaint Document Details Patient Name: [name] [name]. Date of Service: 06/29/2022 10:30 AM Medical Record Number: [id number] Patient Account Number: [id number] Date of Birth/Sex: 1976-10-08 (46 y.o. F) Treating RN: Cornell Barman Primary Care Provider: Raelene Bott Other Clinician: Massie Kluver Referring Provider: Raelene Bott Treating Provider/Extender: Yaakov Guthrie in Treatment: 15 Information Obtained from: Patient Chief Complaint 03/19/2021; patient referred by Dr. Luana Shu who has been looking after her left foot for quite [name] period of time for review of [name] nonhealing area in the left midfoot 03/12/2022; bilateral feet wounds and right lower extremity wound. Electronic Signature(s) Signed: 06/29/2022 12:34:47 PM By: Kalman Shan DO Entered By: Kalman Shan on 06/29/2022 12:12:37 [name] ([id number]) -------------------------------------------------------------------------------- Debridement Details Patient Name: [name] Date of Service: 06/29/2022 10:30 AM Medical Record Number: [id number] Patient Account Number: [id number] Date of Birth/Sex: [birth year] (46 y.o. F) Treating RN: Cornell Barman Primary Care Provider: Raelene Bott Other Clinician: Massie Kluver Referring Provider: Raelene Bott Treating Provider/Extender: Yaakov Guthrie in Treatment: 15 Debridement Performed for Wound #12 Left,Medial,Plantar Foot Assessment: Performed By: Physician Kalman Shan, MD Debridement Type: Debridement Severity of Tissue Pre Debridement: Fat layer exposed Level of Consciousness (Pre- Awake and Alert procedure): Pre-procedure Verification/Time Out Yes - 10:54 Taken: Start Time: 10:54 Total Area Debrided (L x W): 4 (cm) x 2 (cm) = 8 (cm) Tissue and other material Viable, Non-Viable, Callus, Slough, Subcutaneous, Slough debrided: Level: Skin/Subcutaneous Tissue Debridement  Description: Excisional Instrument: Curette Bleeding: Minimum Hemostasis Achieved: Pressure Response to Treatment: Procedure was tolerated well Level of Consciousness (Post- Awake and Alert procedure): Post Debridement Measurements of Total Wound Length: (cm) 3.6 Width: (cm) 1.6 Depth: (cm) 0.2 Volume: (cm) 0.905 Character of Wound/Ulcer Post Debridement: Stable Severity of Tissue Post Debridement: Fat layer exposed Post Procedure Diagnosis Same as Pre-procedure Electronic Signature(s) Signed: 06/29/2022 12:34:47 PM By: Kalman Shan DO Signed: 06/29/2022 5:12:58 PM By: Massie Kluver Signed: 06/30/2022 9:40:40 AM By: Gretta Cool, BSN, RN, CWS, [name] RN, BSN Entered By: Massie Kluver on 06/29/2022 11:00:23 [name], [name] ([id number]) -------------------------------------------------------------------------------- Debridement Details Patient Name: [name] Date of Service: 06/29/2022 10:30 AM Medical Record Number: [id number] Patient Account Number: [id number] Date of Birth/Sex: 25-Mar-1976 (46 y.o. F) Treating RN: Cornell Barman Primary Care Provider: Raelene Bott Other Clinician: Massie Kluver Referring Provider: Raelene Bott Treating Provider/Extender: Yaakov Guthrie in Treatment: 15 Debridement Performed for Wound #11 Right,Plantar Foot Assessment: Performed By: Physician Kalman Shan, MD Debridement Type: Debridement Level of Consciousness (Pre- Awake and Alert procedure): Pre-procedure Verification/Time Out Yes - 10:07 Taken: Start Time: 10:57 Total Area Debrided (L x W): 1 (cm) x 1 (cm) = 1 (cm) Tissue and other material Viable, Non-Viable, Callus, Slough, Subcutaneous, Slough debrided: Level: Skin/Subcutaneous Tissue Debridement Description: Excisional Instrument: Curette Bleeding: Minimum Hemostasis Achieved: Pressure Response to Treatment: Procedure was tolerated well Level of Consciousness (Post- Awake and Alert procedure): Post  Debridement Measurements of Total Wound Length: (cm) 0.7 Width: (cm) 0.4 Depth: (cm) 0.5 Volume: (cm) 0.11 Character of Wound/Ulcer Post Debridement: Stable Post Procedure Diagnosis Same as Pre-procedure Electronic Signature(s) Signed: 06/29/2022 12:34:47 PM By: Kalman Shan DO Signed: 06/29/2022 5:12:58 PM By: Massie Kluver Signed: 06/30/2022 9:40:40 AM By: Gretta Cool, BSN, RN, CWS, [name] RN, BSN Entered By: Massie Kluver on 06/29/2022 11:01:25 [name], [name] ([id number]) -------------------------------------------------------------------------------- HPI Details Patient Name: [name] Date of Service: 06/29/2022 10:30 AM Medical Record Number: [id number] Patient Account Number: [id number] Date of Birth/Sex: 05-May-1976 (46 y.o. F)  Treating RN: Cornell Barman Primary Care Provider: Raelene Bott Other Clinician: Massie Kluver Referring Provider: Raelene Bott Treating Provider/Extender: Yaakov Guthrie in Treatment: 15 History of Present Illness HPI Description: 01/18/18-She is here for initial evaluation of the left great toe ulcer. She is [name] poor historian in regards to timeframe in detail. She states approximately 4 weeks ago she lacerated her toe on something in the house. She followed up with her primary care who placed her on Bactrim and ultimately [name] second dose of Bactrim prior to coming to wound clinic. She states she has been treating the toe with peroxide, Betadine and [name] Band-Aid. She did not check her blood sugar this morning but checked it yesterday morning it was 327; she is unaware of [name] recent A1c and there are no current records. She saw Dr. she would've orthopedics last week for an old injury to the left ankle, she states he did not see her toe, nor did she bring it to his attention. She smokes approximately 1 pack cigarettes [name] day. Her social situation is concerning, she arrives this morning with her mother who appears extremely intoxicated/under the influence;  her mother was asked to leave the room and be monitored by the patient's grandmother. The patient's aunt then accompanied the patient and the room throughout the rest of the appointment. We had [name] lengthy discussion regarding the deleterious effects of uncontrolled hyperglycemia and smoking as it relates to wound healing and overall health. She was strongly encouraged to decrease her smoking and get her diabetes under better control. She states she is currently on [name] diet and has cut down her [facility] consumption. The left toe is erythematous, macerated and slightly edematous with malodor present. The edema in her left foot is below her baseline, there is no erythema streaking. We will treat her with Santyl, doxycycline; we have ordered and xray, culture and provided [name] Peg assist surgical shoe and cultured the wound. 01/25/18-She is here in follow-up evaluation for [name] left great toe ulcer and presents with an abscess to her suprapubic area. She states her blood sugars remain elevated, feeling "sick" and if levels are below 250, but she is trying. She has made no attempt to decrease her smoking stating that we "can't take away her food in her cigarettes". She has been compliant with offloading using the PEG assist you. She is using Santyl daily. the culture obtained last week grew staph aureus and Enterococcus faecalis; continues on the doxycycline and Augmentin was added on Monday. The suprapubic area has erythema, no femoral variation, purple discoloration, minimal induration, was accessed with [name] cotton tip applicator with sanguinopurulent drainage, this was cultured, I suspect the current antibiotic treatment will cover and we will not add anything to her current treatment plan. She was advised to go to urgent care or ER with any change in redness, induration or fever. 02/01/18-She is here in follow-up evaluation for left great toe ulcers and [name] new abdominal abscess from last week. She was able to use  packing until earlier this week, where she "forgot it was there". She states she was feeling ill with GI symptoms last week and was not taking her antibiotic. She states her glucose levels have been predominantly less than 200, with occasional levels between 200-250. She thinks this was contributing to her GI symptoms as they have resolved without intervention. There continues to be significant laceration to left toe, otherwise it clinically looks stable/improved. There is now less superficial opening to the lateral aspect  of the great toe that was residual blister. We will transition to [facility] to all wounds, she will continue her Augmentin. If there is no change or deterioration next week for reculture. 02/08/18-She is here in follow-up evaluation for left great toe ulcer and abdominal ulcer. There is an improvement in both wounds. She has been wrapping her left toe with coban, not by our direction, which has created an area of discoloration to the medial aspect; she has been advised to NOT use coban secondary to her neuropathy. She states her glucose levels have been high over this last week ranging from 200-350, she continues to smoke. She admits to being less compliant with her offloading shoe. We will continue with same treatment plan and she will follow-up next week. 02/15/18-She is here in follow-up evaluation for left great toe ulcer and abdominal ulcer. The abdominal ulcer is epithelialized. The left great toe ulcer is improved and all injury from last week using the Coban wrap is resolved, the lateral ulcer is healed. She admits to noncompliance with wearing offloading shoe and admits to glucose levels being greater than 300 most of the week. She continues to smoke and expresses no desire to quit. There is one area medially that probes deeper than it has historically, erythema to the toe and dorsal foot has consistently waxed and waned. There is no overt signs of cellulitis or  infection but we will culture the wound for any occult infection given the new area of depth and erythema. We will hold off on sensitivities for initiation of antibiotic therapy. 02/22/18-She is here in follow up evaluation for left great toe ulcer. There is overall significant improvement in both wound appearance, erythema and edema with changes made last week. She was not initiated on antibiotic therapy. Culture obtained last week showed oxacillin sensitive staph aureus, sensitive to clindamycin. Clindamycin has been called into the pharmacy but she has been instructed to hold off on initiation secondary to overall clinical improvement and her history of antibiotic intolerance. She has been instructed to contact the clinic with any noted changes/deterioration and the wound, erythema, edema and/or pain. She will follow-up next week. She continues to smoke and her glucose levels remain elevated >250; she admits to compliance with offloading shoe 03/01/18 on evaluation today patient appears to be doing fairly well in regard to her left first toe ulcer. She has been tolerating the dressing changes with the [facility]ressing without complication and overall this has definitely showed signs of improvement according to records as well is what the patient tells me today. I'm very pleased in that regard. She is having no pain today 03/08/18 She is here for follow up evaluation of [name] left great toe ulcer. She remains non-compliant with glucose control and smoking cessation; glucose levels consistently >200. She states that she got new shoe inserts/peg assist. She admits to compliance with offloading. Since my last evaluation there is significant improvement. We will switch to prisma at this time and she will follow up next week. She is noted to be tachycardic at this appointment, heart rate 120s; she has [name] history of heart rate 70-130 according to our records. She admits to extreme agitation r/t personal  issues; she was advised to monitor her heartrate and contact her physician if it does not return to [name] more normal range (<100). She takes cardizem twice daily. 03/15/18-She is here in follow-up evaluation for left great toe ulcer. She remains noncompliant with glucose control and smoking cessation. She admits  to compliance with wearing offloading shoe. The ulcer is improved/stable and we will continue with the same treatment plan and she will follow-up next week 03/22/18-She is here for evaluation for left great toe ulcer. There continues to be significant improvement despite recurrent hyperglycemia (over 500 yesterday) and she continues to smoke. She has been compliant with offloading and we will continue with same treatment plan and she will follow-up next week. 03/29/18-She is here for evaluation for left great toe ulcer. Despite continuing to smoke and uncontrolled diabetes she continues to improve. She is compliant with offloading shoe. We will continue with the same treatment plan and she will follow-up next week 04/05/18- She is here in follow up evaluation for [name] left great toe ulcer; she presents with small pustule to left fifth toe (resembles ant bite). She admits to compliance with wearing offloading shoe; continues to smoke or have uncontrolled blood glucose control. There is more callus than usual with evidence of bleeding; she denies known trauma. 04/12/18-She is here for evaluation of left great toe ulcer. Despite noncompliance with glycemic control and smoking she continues to make [place], Joe [name]. ([id number]) improvement. She continues to wear offloading shoe. The pustule, that was identified last week, to the left fifth toe is resolved. She will follow-up in 2 weeks 05/03/18-she is seen in follow-up evaluation for [name] left great toe ulcer. She is compliant with offloading, otherwise noncompliant with glycemic control and smoking. She has plateaued and there is minimal improvement noted. We  will transition to [facility], replaced the insert to her surgical shoe and she will follow-up in one week 05/10/18- She is here in follow up evaluation for [name] left great toe ulcer. It appears stable despite measurement change. We will continue with same treatment plan and follow up next week. 05/24/18-She is seen in follow-up evaluation for [name] left great toe ulcer. She remains compliant with offloading, has made significant improvement in her diet, decreasing the amount of sugar/soda. She said her recent A1c was 10.9 which is lower than. She did see [name] diabetic nutritionist/educator yesterday. She continues to smoke. We will continue with the same treatment plan and she'll follow-up next week. 05/31/18- She is seen in follow-up evaluation for left great toe ulcer. She continues to remain compliant with offloading, continues to make improvement in her diet, increasing her water and decreasing the amount of sugar/soda. She does continue to smoke with no desire to quit. We will apply Prisma to the depth and Hydrofera Blue over. We have not received insurance authorization for oasis. She will follow up next week. 06/07/18-She is seen in follow-up evaluation for left great toe ulcer. It has stalled according to today's measurements although base appears stable. She says she saw [name] diabetic educator yesterday; her average blood sugars are less than 300 which is an improvement for her. She continues to smoke and states "that's my next step" She continues with water over soda. We will order for xray, culture and reinstate ace wrap compression prior to placing apligraf for next week. She is voicing no complaints or concerns. Her dressing will change to iodoflex over the next week in preparation for apligraf. 06/14/18-She is seen in follow-up evaluation for left great toe ulcer. Plain film x-ray performed last week was negative for osteomyelitis. Wound culture obtained last week grew strep B and OSSA; she is  initiated on keflex and cefdinir today; there is erythema to the toe which could be from ace wrap compression, she has [name] history of wrapping  too tight and has has been encouraged to maintain ace wraps that we place today. We will hold off on application of apligraf today, will apply next week after antibiotic therapy has been initiated. She admits today that she has resumed taking [name] shower with her foot/toe submerged in water, she has been reminded to keep foot/toe out of the bath water. She will be seen in follow up next week 06/21/18-she is seen in follow-up evaluation for left great toe ulcer. She is tolerating antibiotic therapy with no GI disturbance. The wound is stable. Apligraf was applied today. She has been decreasing her smoking, only had 4 cigarettes yesterday and 1 today. She continues being more compliant in diabetic diet. She will follow-up next week for evaluation of site, if stable will remove at 2 weeks. 06/28/18- She is here in follow up evalution. Apligraf was placed last week, she states the dressing fell off on Tuesday and she was dressing with hydrofera blue. She is healed and will be discharged from the clinic today. She has been instructed to continue with smoking cessation, continue monitoring glucose levels, offloading for an additional 4 weeks and continue with hydrofera blue for additional two weeks for any possible microscopic opening. Readmission: 08/07/18 on evaluation today patient presents for reevaluation concerning the ulcer of her right great toe. She was previously discharged on 06/28/18 healed. Nonetheless she states that this began to show signs of drainage she subsequently went to her primary care provider. Subsequently an x-ray was performed on 08/01/18 which was negative. The patient was also placed on antibiotics at that time. Fortunately they should have been effective for the infection. Nonetheless she's been experiencing some improvement but still has [name] lot of  drainage coming from the wound itself. 08/14/18 on evaluation today patient's wound actually does show signs of improvement in regard to the erythema at this point. She has completed the antibiotics. With that being said we did discuss the possibility of placing her in [name] total contact cast as of today although I think that I may want to give this just [name] little bit more time to ensure nothing recurrence as far as her infection is concerned. I do not want to put in the cast and risk infection at that time if things are not completely resolved. With that being said she is gonna require some debridement today. 08/21/18 on evaluation today patient actually appears to be doing okay in regard to her toe ulcer. She's been tolerating the dressing changes without complication. With that being said it does appear that she is ready and in fact I think it's appropriate for [place] to go ahead and initiate the total contact cast today. Nonetheless she will require some sharp debridement to prepare the wound for application. Overall I feel like things have been progressing well but we do need to do something to get this to close more readily. 08/24/18 patient seen today for reevaluation after having had the total contact cast applied on Tuesday. She seems to have done very well the wound appears to be doing great and overall I'm pleased with the progress that she's made. There were no abnormal areas of rubbing from the cast on her lower extremity. 08/30/18 on evaluation today patient actually appears to be completely healed in regard to her plantar toe ulcer. She tells me at this point she's been having [name] lot of issues with the cast. She almost fell [name] couple of times the state shall the step of her dog [name] couple  times as well. This is been [name] very frustrating process for her other nonetheless she has completely healed the wound which is excellent news. Overall there does not appear to be the evidence of infection at this  time which is great news. 09/11/18 evaluation today patient presents for follow-up concerning her great toe ulcer on the left which has unfortunately reopened since I last saw her which was only [name] couple of weeks ago. Unfortunately she was not able to get in to get the shoe and potentially the AFO that's gonna be necessary due to her left foot drop. She continues with offloading shoe but this is not enough to prevent her from reopening it appears. When we last had her in the total contact cast she did well from [name] healing standpoint but unfortunately the wound reopened as soon as she came out of the cast within just [name] couple of weeks. 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 This is also part of the reason why she falls on [name] regular basis. I just do not believe that is gonna be safe for [place] to reinitiate the total contact cast as last time we had this on she fell 3 times one week which is definitely not normal for her. 09/18/18 upon evaluation today the patient actually appears to be doing about the same in regard to her toe ulcer. She did not contact [facility] as I asked her to even though I had given her the prescription. In fact she actually states that she has no idea where the prescription is. She did apparently call [facility] and they told her that all she needed to do was bring the prescription in order to be able to be seen and work on getting the AFO for her left foot. With all that being said she still does not have an appointment and I'm not sure were things stand that regard. I will give her [name] new prescription today in order to contact them to get this set up. 09/25/18 on evaluation today patient actually appears to be doing about the same in regard to her toes ulcer. She does have [name] small areas  which seems to have [name] lot of callous buildup around the edge of the wound which is going to need sharp debridement today. She still is waiting to be scheduled for evaluation with [facility] for possibility of an AFO. She states there supposed to call her tomorrow to get this set up. Unfortunately it does appear that her foot specifically the toe area is showing signs of erythema. There does not appear to be any systemic infection which is in these good news. [name], [name] ([id number]) 10/02/18 on evaluation today patient actually appears to be doing about the same in regard to her toe ulcer. This really has not done too well although it's not significantly larger it's also not significantly smaller. She has been tolerating the dressing changes without complication. She actually has her appointment with [facility] and [facility] tomorrow to hopefully be measured for obtaining and AFO splint. I think this would be helpful preventing this from reoccurring. We had contemplated starting the cast this week although to be honest I am reluctant to do that as she's been having nausea, vomiting, and seizure activity over the past three days.  She has [name] history of seizures and have been told is nothing that can be done for these. With that being said I do believe that along with the seizures have the nausea vomiting which upon further questioning doesn't seem to be the normal for her and makes me concerned for the possibility of infection or something else going on. I discussed this with the patient and her mother during the office visit today. I do not feel the wound is effective but maybe something else. The responses this was "this just happens to her at times and we don't know why". They did not seem to be interested in going to the hospital to have this checked out further. 10/09/18 on evaluation today patient presents for follow-up concerning her ongoing toe ulcer. She has been tolerating the dressing  changes without complication. Fortunately there does not appear to be any evidence of infection which is great news however I do think that the patient would benefit from going ahead for with the total contact cast. She's actually in [name] wheelchair today she tells me that she will use her walker if we initiate the cast. I was very specific about the fact that if we were gonna do the cast I wanted to make sure that she was using the walker in order to prevent any falls. She tells me she does not have stairs that she has to traverse on [name] regular basis at her home. She has not had any seizures since last week again that something that happens to her often she tells me she did talk to [employer] and they said that it may take up to three weeks to get the brace approved for her. Hopefully that will not take that long but nonetheless in the meantime I do think the cast could be of benefit. 10/12/18 on evaluation today patient appears to be doing rather well in regard to her toe ulcer. It's just been [name] few days and already this is significantly improved both as far as overall appearance and size. Fortunately there's no sign of infection. She is here for her first obligatory cast change. 10/19/18 Seen today for follow up and management of left great toe ulcer. Wound continues to show improvement. Noted small open area with seroussang drainage with palpation. Denies any increased pain or recent fevers during visit. She will continue calcium alginate with offloading shoe. Denies any questions or concerns during visit. 10/26/18 on evaluation today patient appears to be doing about the same as when I last saw her in regard to her wound bed. Fortunately there does not appear to be any signs of infection. Unfortunately she continues to have [name] breakdown in regard to the toe region any time that she is not in the cast. It takes almost no time at all for this to happen. Nonetheless she still has not heard anything from the  brace being made by [employer] as to when exactly this will be available to her. Fortunately there is no signs of infection at this time. 10/30/18 on evaluation today patient presents for application of the total contact cast as we just received him this morning. Fortunately we are gonna be able to apply this to her today which is great news. She continues to have no significant pain which is good news. Overall I do feel like things have been improving while she was the cast is when she doesn't have [name] cast that things get worse. She still has not really heard anything from [employer] regarding  her brace. 11/02/18 upon evaluation today patient's wound already appears to be doing significantly better which is good news. Fortunately there does not appear to be any signs of infection also good news. Overall I do think the total contact cast as before is helping to heal this area unfortunately it's just not gonna likely keep the area closed and healed without her getting her brace at least. Again the foot drop is [name] significant issue for her. 11/09/18 on evaluation today patient appears to be doing excellent in regard to her toe ulcer which in fact is completely healed. Fortunately we finally got the situation squared away with the paperwork which was needed to proceed with getting her brace approved by Medicaid. I have filled that out unfortunately that information has been sent to the orthopedic office that I worked at 2 1/2 years ago and not tired Current wound care measures. Fortunately she seems to be doing very well at this time. 11/23/18 on evaluation today patient appears to be doing More Poorly Compared to Last Time I Saw Her. At [facility] She Had Completely Healed. Currently she is continuing to have issues with reopening. She states that she just found out that the brace was approved through Medicaid now she just has to go get measured in order to have this fitted for her and then made. Subsequently she  does not have an appointment for this yet that is going to complicate things we obviously cannot put her back in the cast if we do not have everything measured because they're not gonna be able to measure her foot while she is in the cast. 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 Obviously this is unfortunate and does have me somewhat worried as well. 11/30/18 on evaluation today patient's toe ulcer actually appears to be doing fairly well. The good news is she will be getting her brace in the shoes next week on Wednesday. Hopefully we will be able to get this to heal without having to go back in the cast however she may need the cast in order to get the wound completely heal and then go from there. Fortunately there's no signs of infection at this time. 12/07/18 on evaluation today patient fortunately did receive her brace and she states she could tell this definitely makes her walk better. With that being said she's been having issues with her toe where she noticed yesterday there was [name] lot of tissue that was loosing off this appears to be much larger than what it was previous. She also states that her leg has been read putting much across the top of her foot just about the ankle although this seems to be receiving somewhat. The total area is still red and appears to be someone infected as best I can tell. She is previously taken Bactrim and that may be [name] good option for her today as well. We are gonna see what I wound culture shows as well and I think that this is definitely appropriate. With that being said outside of the culture I still need to initiate something in the interim and that's what I'm gonna go ahead and select Bactrim is [name] good option for her. 12/14/18 on evaluation today patient appears to be doing better in regard to her left great toe ulcer as compared to last week's evaluation.  There's still some erythema although this is  significantly improved which is excellent news. Overall I do believe that she is making good progress is still gonna take some time before she is where I would like her to be from the standpoint of being able to place her back into the total contact cast. Hopefully we will be where we need to be by next week. 12/21/18 on evaluation today patient actually appears to be doing poorly in regard to her toe ulcer. She's been tolerating the dressing changes without complication. Fortunately there's no signs of systemic infection although she does have [name] lot of drainage from the toe ulcer and this does seem to be causing some issues at this point. She does have erythema on the distal portion of her toe that appears to be likely cellulitis. 12/28/18 on evaluation today patient actually appears to be doing [name] little better in my pinion in regard to her toe ulcer. With that being said she still does have some evidence of infection at this time and for her culture she had both E. coli as well as enterococcus as organisms noted on evaluation. For that reason I think that though the Keflex likely has treated the E. coli rather well this has really done nothing for the enterococcus. We are going to have to initiate treatment for this specifically. [name], [name] ([id number]) 01/04/19 on evaluation today patient's toe actually appears to be doing better from the standpoint of infection. She currently would like to see about putting the cash back on I think that this is appropriate as long as she takes care of it and keeps it from getting wet. She is gonna have some drainage we can definitely pass this up with Drawtex and alginate to try to prevent as much drainage as possible from causing the problems. With that being said I do want to at least try her with the cast between now and Tuesday. If there any issues we can't continue to use it then I will discontinue the use of  the cast at that point. 01/08/19 on evaluation today patient actually appears to be doing very well as far as her foot ulcer specifically the great toe on the left is concerned. She did have an area of rubbing on the medial aspect of her left ankle which again is from the cast. Fortunately there's no signs of infection at this point in this appears to be [name] very slight skin breakdown. The patient tells me she felt it rubbing but didn't think it was that bad. Fortunately there is no signs of active infection at this time which is good news. No fevers, chills, nausea, or vomiting noted at this time. 01/15/19 on evaluation today patient actually appears to be doing well in regard to her toe ulcer. Again as previous she seems to do well and she has the cast on which indicates to me that during the time she doesn't have [name] cast on she's putting way too much pressure on this region. Obviously I think that's gonna be an issue as with the current national emergency concerning the Covid-19 Virus it has been recommended that we discontinue the use of total contact casting by the chief medical officer of our company, Dr. Simona Huh. The reasoning is that if [name] patient becomes sick and cannot come into have the cast removed they could not just leave this on for an additional two weeks. Obviously the hospitals also do not want to receive patient's who are sick into the emergency department to potentially contaminate the region  and spread the Covid-19 Virus among other sick individuals within the hospital system. Therefore at this point we are suspending the use of total contact cast until the current emergency subsides. This was all discussed with the patient today as well. 01/22/19 on evaluation today patient's wound on her left great toe appears to be doing slightly worse than previously noted last week. She tells me that she has been on this quite [name] bit in fact she tells me she's been awake for 38 straight hours. This is  due to the fact that she's having to care for grandparents because nobody else will. She has been taking care of them for five the last seven days since I've seen her they both have dementia his is from [name] stroke and her grandmother's was progressive. Nonetheless she states even her mom who knows her condition and situation has only help two of those days to take care of them she's been taking care of the rest. Fortunately there does not appear to be any signs of active infection in regard to her toe at this point although obviously it doesn't look as good as it did previous. I think this is directly related to her not taking off the pressure and friction by way of taking things easy. Though I completely understand what's going on. 01/29/19 on evaluation today patient's tools are actually appears to be showing some signs of improvement today compared to last week's evaluation as far as not necessarily the overall size of the wound but the fact that she has some new skin growth in between the two ends of the wound opening. Overall I feel like she has done well she states that she had [name] family member give her what sounds to be [name] CAM walker boot which has been helpful as well. 02/05/19 on evaluation today patient's wound bed actually appears to be doing significantly better in regard to her overall appearance of the size of the wound. With that being said she is still having an issue with offloading efficiently enough to get this to close. Apparently there is some signs of infection at this point as well unfortunately. Previously she's done well of Augmentin I really do not see anything that needs to be culture currently but there theme and cellulitis of the foot that I'm seeing I'm gonna go ahead and place her on an antibiotic today to try to help clear this up. 02/12/2019 on evaluation today patient actually appears to be doing poorly in regard to her overall wound status. She tells me she has been  using her offloading shoe but actually comes in today wearing her tennis shoe with the AFO brace. Again as I previously discussed with her this is really not sufficient to allow the area to heal appropriately. Nonetheless she continues to be somewhat noncompliant and I do wonder based on what she has told my nurse in the past as to whether or not [name] good portion of this noncompliance may be recreational drug and alcohol related. She has had [name] history of heroin overdose and this was fairly recently in the past couple of months that have been seeing her. Nonetheless overall I feel like her wound looks significantly worse today compared to what it was previous. She still has significant erythema despite the Augmentin I am not sure that this is an appropriate medication for her infection I am also concerned that the infection may have gone down into her bone. 02/19/19 on evaluation today patient actually appears to  be doing about the same in regard to her toe ulcer. Unfortunately she continues to show signs of bone exposure and infection at this point. There does not appear to be any evidence of worsening of the infection but I'm also not really sure that it's getting significantly better. She is on the Augmentin which should be sufficient for the Staphylococcus aureus infection that she has at this point. With that being said she may need IV antibiotics to more appropriately treat this. We did have [name] discussion today about hyperbaric option therapy. 02/28/19 on evaluation today patient actually appears to be doing much worse in regard to the wound on her left great toe as compared to even my previous evaluation last week. Unfortunately this seems to be training in [name] pretty poor direction. Her toe was actually now starting to angle laterally and I can actually see the entire joint area of the proximal portion of the digit where is the distal portion of the digit again is no longer even in contact with the  joint line. Unfortunately there's [name] lot more necrotic tissue around the edge and the toe appears to be showing signs of becoming gangrenous in my pinion. I'm very concerned about were things stand at this point. She did see infectious disease and they are planning to send in [name] prescription for Sivextro for her and apparently this has been approved. With that being said I don't think she should avoid taking this but at the same time I'm not sure that it's gonna be sufficient to save her toe at this point. She tells me that she still having to care for grandparents which I think is putting quite [name] bit of strain on her foot and specifically the total area and has caused this to break down even to [name] greater degree than would've otherwise been expected. 03/05/19 on evaluation today patient actually appears to be doing quite well in regard to her toe all things considering. She still has bone exposed but there appears to be much less your thing on overall the appearance of the wound and the toe itself is dramatically improved. She still does have some issues currently obviously with infection she did see vascular as well and there concerned that her blood flow to the toad. For that reason they are setting up for an angiogram next week. 03/14/19 on evaluation today patient appears to be doing very poor in regard to her toe and specifically in regard to the ulceration and the fact that she's starting to notice the toe was leaning even more towards the lateral aspect and the complete joint is visible on the proximal aspect of the joint. Nonetheless she's also noted [name] significant odor and the tip of the toe is turning more dark and necrotic appearing. Overall I think she is getting worse not better as far as this is concerned. For that reason I am recommending at this point that she likely needs to be seen for likely amputation. READMISSION 03/19/2021 This is [name] patient that we cared for in this clinic for [name]  prolonged period of time in 2019 and 2020 with [name] left foot and left first toe wound. 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 ([id number]) 04/09/20 by Dr. Luana Shu. In December 2021 she had an ulcer on her right great toe as well as the fourth and fifth toes. She underwent [name] partial  ray amputation of the right fourth and fifth toes. She also had an angiogram at that time and underwent angioplasty of the right anterior tibial artery. In any case she claims that the wound on the right foot is closed I did not look at this today which was probably an oversight although I think that should be done next week. 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 According to Dr. Deborra Medina last review that she was out of the area being cared for by another physician but recently came back to his attention. 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 [name] culture of this area showed E. coli apparently before she came back to see Dr. Luana Shu she was supposed to be receiving antibiotics but she did not really take them. Nor is she offloading this area at all. Finally her last hemoglobin A1c listed in epic was in March 2022 at 14.1 she says things are [name] lot better since then although I am not sure. 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 She had this wound at the time. During this admission she had E. coli sepsis [name] TEE was negative. 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 She is still [name] heavy cigarette smoker.  She has [name] history of bipolar disorder seizure disorder ABI in our clinic was 1.05 6/1; left midfoot in the setting of [name] TMA done previously. Round circular wound with [name] "knuckle" of protruding tissue. The problem is that the knuckle was not attached to any of the surrounding granulation and this probed proximally widely I removed [name] large portion of this tissue. This wound goes with considerable undermining laterally. I do not feel any bone there was no purulence but this is [name] deep wound. 6/8; in spite of the debridement I did last week. She arrives with [name] wound looking exactly the same. [name] protruding "knuckle" of tissue nonadherent to most of the surrounding tissue. There is considerable depth around this from 6-12 o'clock at 2.7 cm and undermining of 1 cm. This does not look overtly infected and the x-ray I did last week was negative for any osseous abnormalities. We have been using silver collagen 6/15; deep tissue culture I did last week showed moderate staph aureus and moderate Pseudomonas. This will definitely require prolonged antibiotic therapy. The pathology on the protuberant area was negative for malignancy fungus etc. 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 We have been using silver collagen. I am going to be prescribing Levaquin for 2 weeks. Her CT scan of the foot is down for 7/5 6/22; CT scan of the foot on 7 5. She says she has hardware in the left leg from her previous fracture. She is on the Levaquin for the deep tissue culture I did that showed methicillin sensitive staph aureus and Pseudomonas. I gave her [name] 2-week supply and she will have another week. She arrives in clinic today with the same protuberant tissue however this is nonadherent to the tissue surrounding it. I am really at [name] loss to explain this unless there is underlying deep tissue infection 6/29; patient presents for 1 week follow-up. She has been  using  collagen to the wound bed. She reports taking her antibiotics as prescribed.She has no complaints or issues today. She denies signs of infection. 7/6; patient presents for one week followup. She has been using collagen to the wound bed. She states she is taking Levaquin however at times she is not able to keep it down. She denies signs of infection. 7/13; patient presents for 1 week follow-up. She has been using silver alginate to the wound bed. She still has nausea when taking her antibiotics. She denies signs of infection. 7/20; patient presents for 1 week follow-up. She has been using silver alginate with gentamicin cream to the wound bed. She denies any issues and has no complaints today. She denies signs of infection. 7/27; patient presents for 1 week follow-up. She continues to use silver alginate with gentamicin cream to the wound bed. She reports starting her antibiotics. She has no issues or complaints. Overall she reports stability to the wound. 8/3; patient presents for 1 week follow-up. She has been using silver alginate with gentamicin cream to the wound bed. She reports completing all antibiotics. She has no issues or complaints today. She denies signs of infection. 8/17; patient presents for 2-week follow-up. He is to use silver alginate to the wound bed. She has no issues or complaints today. She denies signs of infection. She reports her pain has improved in her foot since last clinic visit 8/24; patient presents for 1 week follow-up. She continues to use silver alginate to the wound bed. She has no issues or complaints. She denies signs of infection. Pain is stable. 9/7; patient presents for follow-up. She missed her last week appointment due to feeling ill. She continues to use silver alginate. She has [name] new wound to the right lower extremity that is covered in eschar. She states It occurred over the past week and has no idea how it started. She currently denies signs of  infection. 9/14; patient presents for follow-up. To the left foot wound she has been using gentamicin cream and silver alginate. To the right lower extremity wound she has been keeping this covered and has not obtain Santyl. 9/21; patient presents for follow-up. She reports using gentamicin cream and silver alginate to the left foot and Santyl to the right lower extremity wound. She has no issues or complaints today. She denies signs of infection. 9/28; patient presents for follow-up. She reports [name] new wound to her right heel. She states this occurred [name] few days ago and is progressively gotten worse. She has been trying to clean the area with [name] Q-tip and Santyl. She reports stability in the other 2 wounds. She has been using gentamicin cream and silver alginate to the left foot and Santyl to the right lower extremity wound. 10/12; patient presents for follow-up. She reports improvement to the wound beds. She is seeing vein and vascular to discuss the potential of [name] left BKA. She states they are going to do an arteriogram. She continues to use silver alginate with dressing changes to her wounds. 11/2; patient presents for follow-up. She states she has not been doing dressing changes to the wound beds. She states she is not able to offload the areas. She reports chronic pain to her left foot wound. 11/9; patient presents for follow-up. She came in with only socks on. She states she forgot to put on shoes. It is unclear if she is doing any dressing changes. She currently denies systemic signs of infection. 11/16; patient presents for follow-up.  She came again only with socks on. She states she does not wear shoes ever. It is unclear if she does dressing changes. She currently denies systemic signs of infection. 11/23; patient presents for follow-up. She wore her shoes today. It still unclear exactly what dressing she is using for each wound but she did states she obtained Dakin's solution and has been  using this to the left foot wound. She currently denies signs of infection. 11/30; patient presents for follow-up. She has no issues or complaints today. She currently denies signs of infection. 12/7; patient presents for follow-up. She has no issues or complaints today. She has been using Hydrofera Blue to the right heel wound and Dakin solution to the left foot wound. Her right anterior leg wound is healed. She currently denies signs of infection. [name], [name] ([id number]) 12/14; patient presents for follow-up. She has been using Hydrofera Blue to the right heel and Dakin's to the left foot wounds. She has no issues or complaints today. She denies signs of infection. 12/21; patient presents for follow-up. She reports using Hydrofera Blue to the right heel and Dakin's to the left foot wound. She denies signs of infection. 12/28; patient presents for follow-up. She continues to use Dakin's to the left foot wound and Hydrofera Blue to the right heel wound. She denies signs of infection. 1/4; patient presents for follow-up. She has no issues or complaints today. She denies signs of infection. 1/11; patient presents for follow-up. It is unclear if she has been dressing these wounds over the past week. She currently denies signs of infection. 1/18; patient presents for follow-up. She states she has been using Dakin's wet-to-dry dressings to the left foot. She has been using Hydrofera Blue to the right foot foot wound. She states that the anterior right leg wound has reopened and draining serous fluid. She denies signs of infection. 1/25; patient presents for follow-up. She has no issues or complaints today. 2/1; patient presents for follow-up. She has no issues or complaints today. She denies signs of infection. 2/8; patient presents for follow-up. She has lost her surgical shoes. She did not have [name] dressing to the right heel wound. She currently denies signs of infection. 2/15; patient presents  for follow-up. She reports more pain to the right heel today. She denies purulent drainage Or fever/chills 2/22; patient presents for follow-up. She reports taking clindamycin over the past week. She states that she continues to have pain to her right heel. She reports purulent drainage. Readmission 03/16/2022 Ms. Cher Franzoni is [name] 46 year old female with [name] past medical history of type 2 diabetes, osteomyelitis to her feet, chronic systolic heart failure and bipolar disorder that presents to the clinic for bilateral feet wounds and right lower extremity wound. She was last seen in our clinic on 12/15/2021. At that time she had purulent drainage coming out of her right plantar foot and I recommended she go to the ED. She states she went to [facility] and has been there for the past 3 months. I cannot see the records. She states she had OR debridement and was on several weeks of IV antibiotics while inpatient. Since discharge she has not been taking care of the wound beds. She had nothing on her feet other than socks today. She currently denies signs of infection. 5/31; patient presents for follow-up. She has been using Dakin's wet-to-dry dressings to the wound beds on her feet bilaterally and antibiotic ointment to the right anterior leg wound. She had  [name] wound culture done at last clinic visit that showed moderate Pseudomonas aeruginosa sensitive to ciprofloxacin. She currently denies systemic signs of infection. 6/14; patient presents for follow-up. She received Keystone 5 days ago and has been using this on the wound beds. She states that last week she had to go to the hospital because she had increased warmth and erythema to the right foot. She was started on 2 oral antibiotics. She states she has been taking these. She currently denies systemic signs of infection. She has no issues or complaints today. 6/21; patient presents for follow-up. She states she has been using Keystone  antibiotics to the wound beds. She has no issues or complaints today. She denies signs of infection. 6/28; patient presents for follow-up. She has been using Keystone antibiotics to the wound beds. She has no issues or complaints today. 7/12; patient presents for follow-up. Has been using Keystone antibiotics to the wound beds with calcium alginate. She has no issues or complaints today. She never followed up with her orthopedic surgeon who did the OR debridement to the right foot. We discussed the total contact cast for the left foot and patient would like to do this next week. 7/19; patient presents for follow-up. She has been using Keystone antibiotics with calcium alginate to the wound beds. She has no issues or complaints today. Patient is in agreement to do the total contact cast of the left foot today. She knows to return later this week for the obligatory cast change. 05-13-2022 upon evaluation today patient's wound which she has the cast of the left leg actually appears to be doing significantly better. Fortunately I do not see any signs of active infection locally or systemically which is great news and overall I am extremely pleased with where we stand currently. 7/26; patient presents for follow-up. She has [name] cast in place for the past week. She states it irritated her shin. Other than that she tolerated the cast well. She states she would like [name] break for 1 week from the cast. We have been using Keystone antibiotic and Aquacel to both wound beds. She denies signs of infection. 8/2; patient presents for follow-up. She has been using Keystone and Aquacel to the wound beds. She denies any issues and has no complaints. She is agreeable to have the cast placed today for the left leg. 06-03-2022 upon evaluation today patient appears to be doing well with regard to her wound she saw some signs of improvement which is great news. Fortunately I do not see any evidence of active infection locally  or systemically at this time which is great news. No fevers, chills, nausea, vomiting, or diarrhea. 8/16; patient presents for follow-up. She has no issues or complaints today. We have been using Keystone and Aquacel to the wound beds. The left lower extremity is in [name] total contact cast. She is tolerated this well. 8/23; patient presents for follow-up. She has had the total contact cast on the left leg for the past week. Unfortunately this has rubbed and broken down the skin to the medial foot. She currently denies signs of infection. She has been using Keystone antibiotic to the right plantar foot wound. [name], [name] ([id number]) 8/30; patient presents for follow-up. We have held off on the total contact cast for the left leg for the past week. Her wound on the left foot has improved and the previous surrounding breakdown of skin has epithelialized. She has been using Keystone antibiotic to both wound beds. She  has no issues or complaints today. She denies signs of infection. 9/6; patient presents for follow-up. She has ordered her's Keystone antibiotic and this is arriving this week. She has been doing Dakin's wet-to- dry dressings to the wound beds. She denies signs of infection. She is agreeable to the total contact cast today. Electronic Signature(s) Signed: 06/29/2022 12:34:47 PM By: Kalman Shan DO Entered By: Kalman Shan on 06/29/2022 12:13:14 [name] ([id number]) -------------------------------------------------------------------------------- Physical Exam Details Patient Name: [name] Date of Service: 06/29/2022 10:30 AM Medical Record Number: [id number] Patient Account Number: [id number] Date of Birth/Sex: 11-06-1975 (46 y.o. F) Treating RN: Cornell Barman Primary Care Provider: Raelene Bott Other Clinician: Massie Kluver Referring Provider: Raelene Bott Treating Provider/Extender: Yaakov Guthrie in Treatment:  15 Constitutional . Cardiovascular . Psychiatric . Notes Right foot: To the plantar heel there is an incision site with increased depth. Nonviable tissue at the surface, callus present. No probing to bone. Left foot: To the medial aspect there is an open wound with granulation tissue and nonviable tissue with surrounding callus. Electronic Signature(s) Signed: 06/29/2022 12:34:47 PM By: Kalman Shan DO Entered By: Kalman Shan on 06/29/2022 12:14:29 [name] ([id number]) -------------------------------------------------------------------------------- Physician Orders Details Patient Name: [name] Date of Service: 06/29/2022 10:30 AM Medical Record Number: [id number] Patient Account Number: [id number] Date of Birth/Sex: 1976/08/31 (46 y.o. F) Treating RN: Cornell Barman Primary Care Provider: Raelene Bott Other Clinician: Massie Kluver Referring Provider: Raelene Bott Treating Provider/Extender: Yaakov Guthrie in Treatment: 15 Verbal / Phone Orders: No Diagnosis Coding Follow-up Appointments o Return Appointment in 1 week. o Nurse Visit as needed [employer] o Wash wounds with antibacterial soap and water. o No tub bath. Anesthetic (Use 'Patient Medications' Section for Anesthetic Order Entry) o Lidocaine applied to wound bed Edema Control - Lymphedema / Segmental Compressive Device / Other o Elevate, Exercise Daily and Avoid Standing for Long Periods of Time. o Elevate legs to the level of the heart and pump ankles as often as possible o Elevate leg(s) parallel to the floor when sitting. Off-Loading o Total Contact Cast to Left Lower Extremity - contact cast #4 applied Don't get cast wet o Other: - keep pressure off of feet. Medications-Please add to medication list. o Keystone Compound - Keystone gel o Topical Antibiotic - AandD ointment to broken down area Wound Treatment Wound #11 - Foot Wound  Laterality: Plantar, Right Cleanser: Soap and Water 1 x Per Day/30 Days Discharge Instructions: Gently cleanse wound with antibacterial soap, rinse and pat dry prior to dressing wounds Topical: keystone gel 1 x Per Day/30 Days Primary Dressing: Aquacel Extra Hydrofiber Dressing, 2x2 (in/in) 1 x Per Day/30 Days Secondary Dressing: ABD Pad 5x9 (in/in) (Generic) 1 x Per Day/30 Days Discharge Instructions: Cover with ABD pad Secured With: Medipore Tape - 19M Medipore H Soft Cloth Surgical Tape, 2x2 (in/yd) (Generic) 1 x Per Day/30 Days Secured With: Kerlix Roll Sterile or Non-Sterile 6-ply 4.5x4 (yd/yd) (Generic) 1 x Per Day/30 Days Discharge Instructions: Apply Kerlix as directed Wound #12 - Foot Wound Laterality: Plantar, Left, Medial Cleanser: Soap and Water 1 x Per Day/30 Days Discharge Instructions: Gently cleanse wound with antibacterial soap, rinse and pat dry prior to dressing wounds Topical: Vitamin AandD Ointment, 4(oz) tube 1 x Per Day/30 Days Discharge Instructions: apply to red, broken down area Topical: keystone gel 1 x Per Day/30 Days Discharge Instructions: apply very thin layer to wound Primary Dressing: Hydrofera Blue Ready Transfer Foam, 2.5x2.5 (in/in) 1 x  Per Day/30 Days Discharge Instructions: Apply Hydrofera Blue Ready to wound bed as directed [name], [name] ([id number]) Secondary Dressing: ABD Pad 5x9 (in/in) (Generic) 1 x Per Day/30 Days Discharge Instructions: Cover with ABD pad Secured With: Medipore Tape - 96M Medipore H Soft Cloth Surgical Tape, 2x2 (in/yd) (Generic) 1 x Per Day/30 Days Secured With: Kerlix Roll Sterile or Non-Sterile 6-ply 4.5x4 (yd/yd) (Generic) 1 x Per Day/30 Days Discharge Instructions: Apply Kerlix as directed Electronic Signature(s) Signed: 06/29/2022 12:34:47 PM By: Kalman Shan DO Entered By: Kalman Shan on 06/29/2022 12:19:41 [name], [name] Kitchen  ([id number]) -------------------------------------------------------------------------------- Problem List Details Patient Name: [name] Date of Service: 06/29/2022 10:30 AM Medical Record Number: [id number] Patient Account Number: [id number] Date of Birth/Sex: [birth year] (46 y.o. 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 03/16/2022 No Yes exposed E11.621 Type 2 diabetes mellitus with foot ulcer 03/16/2022 No Yes E11.42 Type 2 diabetes mellitus with diabetic polyneuropathy 03/16/2022 No Yes L97.811 Non-pressure chronic ulcer of other part of right lower leg limited to 03/16/2022 No Yes breakdown of skin Inactive Problems Resolved Problems Electronic Signature(s) Signed: 06/29/2022 12:34:47 PM By: Kalman Shan DO Entered By: Kalman Shan on 06/29/2022 12:12:32 [name] ([id number]) -------------------------------------------------------------------------------- Progress Note Details Patient Name: [name] Date of Service: 06/29/2022 10:30 AM Medical Record Number: [id number] Patient Account Number: [id number] Date of Birth/Sex: February 19, 1976 (46 y.o. F) Treating RN: Cornell Barman Primary Care Provider: Raelene Bott Other Clinician: Massie Kluver Referring Provider: Raelene Bott Treating Provider/Extender: Yaakov Guthrie in Treatment: 15 Subjective Chief Complaint Information obtained from Patient 03/19/2021; patient referred by Dr. Luana Shu who has been looking after her left foot for quite [name] period of time for review of [name] nonhealing  area in the left midfoot 03/12/2022; bilateral feet wounds and right lower extremity wound. History of Present Illness (HPI) 01/18/18-She is here for initial evaluation of the left great toe ulcer. She is [name] poor historian in regards to timeframe in detail. She states approximately 4 weeks ago she lacerated her toe on something in the house. She followed up with her primary care who placed her on Bactrim and ultimately [name] second dose of Bactrim prior to coming to wound clinic. She states she has been treating the toe with peroxide, Betadine and [name] Band-Aid. She did not check her blood sugar this morning but checked it yesterday morning it was 327; she is unaware of [name] recent A1c and there are no current records. She saw Dr. she would've orthopedics last week for an old injury to the left ankle, she states he did not see her toe, nor did she bring it to his attention. She smokes approximately 1 pack cigarettes [name] day. Her social situation is concerning, she arrives this morning with her mother who appears extremely intoxicated/under the influence; her mother was asked to leave the room and be monitored by the patient's grandmother. The patient's aunt then accompanied the patient and the room throughout the rest of the appointment. We had [name] lengthy discussion regarding the deleterious effects of uncontrolled hyperglycemia and smoking as it relates to wound healing and overall health. She was strongly encouraged to decrease her smoking and  get her diabetes under better control. She states she is currently on [name] diet and has cut down her [facility] consumption. The left toe is erythematous, macerated and slightly edematous with malodor present. The edema in her left foot is below her baseline, there is no erythema streaking. We will treat her with Santyl, doxycycline; we have ordered and xray, culture and provided [name] Peg assist surgical shoe and cultured the wound. 01/25/18-She is here in follow-up evaluation for  [name] left great toe ulcer and presents with an abscess to her suprapubic area. She states her blood sugars remain elevated, feeling "sick" and if levels are below 250, but she is trying. She has made no attempt to decrease her smoking stating that we "can't take away her food in her cigarettes". She has been compliant with offloading using the PEG assist you. She is using Santyl daily. the culture obtained last week grew staph aureus and Enterococcus faecalis; continues on the doxycycline and Augmentin was added on Monday. The suprapubic area has erythema, no femoral variation, purple discoloration, minimal induration, was accessed with [name] cotton tip applicator with sanguinopurulent drainage, this was cultured, I suspect the current antibiotic treatment will cover and we will not add anything to her current treatment plan. She was advised to go to urgent care or ER with any change in redness, induration or fever. 02/01/18-She is here in follow-up evaluation for left great toe ulcers and [name] new abdominal abscess from last week. She was able to use packing until earlier this week, where she "forgot it was there". She states she was feeling ill with GI symptoms last week and was not taking her antibiotic. She states her glucose levels have been predominantly less than 200, with occasional levels between 200-250. She thinks this was contributing to her GI symptoms as they have resolved without intervention. There continues to be significant laceration to left toe, otherwise it clinically looks stable/improved. There is now less superficial opening to the lateral aspect of the great toe that was residual blister. We will transition to [facility] to all wounds, she will continue her Augmentin. If there is no change or deterioration next week for reculture. 02/08/18-She is here in follow-up evaluation for left great toe ulcer and abdominal ulcer. There is an improvement in both wounds. She has been wrapping her  left toe with coban, not by our direction, which has created an area of discoloration to the medial aspect; she has been advised to NOT use coban secondary to her neuropathy. She states her glucose levels have been high over this last week ranging from 200-350, she continues to smoke. She admits to being less compliant with her offloading shoe. We will continue with same treatment plan and she will follow-up next week. 02/15/18-She is here in follow-up evaluation for left great toe ulcer and abdominal ulcer. The abdominal ulcer is epithelialized. The left great toe ulcer is improved and all injury from last week using the Coban wrap is resolved, the lateral ulcer is healed. She admits to noncompliance with wearing offloading shoe and admits to glucose levels being greater than 300 most of the week. She continues to smoke and expresses no desire to quit. There is one area medially that probes deeper than it has historically, erythema to the toe and dorsal foot has consistently waxed and waned. There is no overt signs of cellulitis or infection but we will culture the wound for any occult infection given the new area of depth and erythema. We will  hold off on sensitivities for initiation of antibiotic therapy. 02/22/18-She is here in follow up evaluation for left great toe ulcer. There is overall significant improvement in both wound appearance, erythema and edema with changes made last week. She was not initiated on antibiotic therapy. Culture obtained last week showed oxacillin sensitive staph aureus, sensitive to clindamycin. Clindamycin has been called into the pharmacy but she has been instructed to hold off on initiation secondary to overall clinical improvement and her history of antibiotic intolerance. She has been instructed to contact the clinic with any noted changes/deterioration and the wound, erythema, edema and/or pain. She will follow-up next week. She continues to smoke and her glucose  levels remain elevated >250; she admits to compliance with offloading shoe 03/01/18 on evaluation today patient appears to be doing fairly well in regard to her left first toe ulcer. She has been tolerating the dressing changes with the [facility] without complication and overall this has definitely showed signs of improvement according to records as well is what the patient tells me today. I'm very pleased in that regard. She is having no pain today 03/08/18 She is here for follow up evaluation of [name] left great toe ulcer. She remains non-compliant with glucose control and smoking cessation; glucose levels consistently >200. She states that she got new shoe inserts/peg assist. She admits to compliance with offloading. Since my last evaluation there is significant improvement. We will switch to prisma at this time and she will follow up next week. She is noted to be tachycardic at this appointment, heart rate 120s; she has [name] history of heart rate 70-130 according to our records. She admits to extreme agitation r/t personal issues; she was advised to monitor her heartrate and contact her physician if it does not return to [name] more normal range (<100). She takes cardizem twice daily. 03/15/18-She is here in follow-up evaluation for left great toe ulcer. She remains noncompliant with glucose control and smoking cessation. She admits to compliance with wearing offloading shoe. The ulcer is improved/stable and we will continue with the same treatment plan and she will follow-up next week 03/22/18-She is here for evaluation for left great toe ulcer. There continues to be significant improvement despite recurrent hyperglycemia (over 500 yesterday) and she continues to smoke. She has been compliant with offloading and we will continue with same treatment plan and she will follow-up next week. Thew, Valerie [name]. ([id number]) 03/29/18-She is here for evaluation for left great toe ulcer. Despite continuing to  smoke and uncontrolled diabetes she continues to improve. She is compliant with offloading shoe. We will continue with the same treatment plan and she will follow-up next week 04/05/18- She is here in follow up evaluation for [name] left great toe ulcer; she presents with small pustule to left fifth toe (resembles ant bite). She admits to compliance with wearing offloading shoe; continues to smoke or have uncontrolled blood glucose control. There is more callus than usual with evidence of bleeding; she denies known trauma. 04/12/18-She is here for evaluation of left great toe ulcer. Despite noncompliance with glycemic control and smoking she continues to make improvement. She continues to wear offloading shoe. The pustule, that was identified last week, to the left fifth toe is resolved. She will follow-up in 2 weeks 05/03/18-she is seen in follow-up evaluation for [name] left great toe ulcer. She is compliant with offloading, otherwise noncompliant with glycemic control and smoking. She has plateaued and there is minimal improvement noted. We will transition to  Hydrofera Blue, replaced the insert to her surgical shoe and she will follow-up in one week 05/10/18- She is here in follow up evaluation for [name] left great toe ulcer. It appears stable despite measurement change. We will continue with same treatment plan and follow up next week. 05/24/18-She is seen in follow-up evaluation for [name] left great toe ulcer. She remains compliant with offloading, has made significant improvement in her diet, decreasing the amount of sugar/soda. She said her recent A1c was 10.9 which is lower than. She did see [name] diabetic nutritionist/educator yesterday. She continues to smoke. We will continue with the same treatment plan and she'll follow-up next week. 05/31/18- She is seen in follow-up evaluation for left great toe ulcer. She continues to remain compliant with offloading, continues to make improvement in her diet, increasing her  water and decreasing the amount of sugar/soda. She does continue to smoke with no desire to quit. We will apply Prisma to the depth and Hydrofera Blue over. We have not received insurance authorization for oasis. She will follow up next week. 06/07/18-She is seen in follow-up evaluation for left great toe ulcer. It has stalled according to today's measurements although base appears stable. She says she saw [name] diabetic educator yesterday; her average blood sugars are less than 300 which is an improvement for her. She continues to smoke and states "that's my next step" She continues with water over soda. We will order for xray, culture and reinstate ace wrap compression prior to placing apligraf for next week. She is voicing no complaints or concerns. Her dressing will change to iodoflex over the next week in preparation for apligraf. 06/14/18-She is seen in follow-up evaluation for left great toe ulcer. Plain film x-ray performed last week was negative for osteomyelitis. Wound culture obtained last week grew strep B and OSSA; she is initiated on keflex and cefdinir today; there is erythema to the toe which could be from ace wrap compression, she has [name] history of wrapping too tight and has has been encouraged to maintain ace wraps that we place today. We will hold off on application of apligraf today, will apply next week after antibiotic therapy has been initiated. She admits today that she has resumed taking [name] shower with her foot/toe submerged in water, she has been reminded to keep foot/toe out of the bath water. She will be seen in follow up next week 06/21/18-she is seen in follow-up evaluation for left great toe ulcer. She is tolerating antibiotic therapy with no GI disturbance. The wound is stable. Apligraf was applied today. She has been decreasing her smoking, only had 4 cigarettes yesterday and 1 today. She continues being more compliant in diabetic diet. She will follow-up next week for  evaluation of site, if stable will remove at 2 weeks. 06/28/18- She is here in follow up evalution. Apligraf was placed last week, she states the dressing fell off on Tuesday and she was dressing with hydrofera blue. She is healed and will be discharged from the clinic today. She has been instructed to continue with smoking cessation, continue monitoring glucose levels, offloading for an additional 4 weeks and continue with hydrofera blue for additional two weeks for any possible microscopic opening. Readmission: 08/07/18 on evaluation today patient presents for reevaluation concerning the ulcer of her right great toe. She was previously discharged on 06/28/18 healed. Nonetheless she states that this began to show signs of drainage she subsequently went to her primary care provider. Subsequently an x-ray was performed on 08/01/18  which was negative. The patient was also placed on antibiotics at that time. Fortunately they should have been effective for the infection. Nonetheless she's been experiencing some improvement but still has [name] lot of drainage coming from the wound itself. 08/14/18 on evaluation today patient's wound actually does show signs of improvement in regard to the erythema at this point. She has completed the antibiotics. With that being said we did discuss the possibility of placing her in [name] total contact cast as of today although I think that I may want to give this just [name] little bit more time to ensure nothing recurrence as far as her infection is concerned. I do not want to put in the cast and risk infection at that time if things are not completely resolved. With that being said she is gonna require some debridement today. 08/21/18 on evaluation today patient actually appears to be doing okay in regard to her toe ulcer. She's been tolerating the dressing changes without complication. With that being said it does appear that she is ready and in fact I think it's appropriate for [place] to  go ahead and initiate the total contact cast today. Nonetheless she will require some sharp debridement to prepare the wound for application. Overall I feel like things have been progressing well but we do need to do something to get this to close more readily. 08/24/18 patient seen today for reevaluation after having had the total contact cast applied on Tuesday. She seems to have done very well the wound appears to be doing great and overall I'm pleased with the progress that she's made. There were no abnormal areas of rubbing from the cast on her lower extremity. 08/30/18 on evaluation today patient actually appears to be completely healed in regard to her plantar toe ulcer. She tells me at this point she's been having [name] lot of issues with the cast. She almost fell [name] couple of times the state shall the step of her dog [name] couple times as well. This is been [name] very frustrating process for her other nonetheless she has completely healed the wound which is excellent news. Overall there does not appear to be the evidence of infection at this time which is great news. 09/11/18 evaluation today patient presents for follow-up concerning her great toe ulcer on the left which has unfortunately reopened since I last saw her which was only [name] couple of weeks ago. Unfortunately she was not able to get in to get the shoe and potentially the AFO that's gonna be necessary due to her left foot drop. She continues with offloading shoe but this is not enough to prevent her from reopening it appears. When we last had her in the total contact cast she did well from [name] healing standpoint but unfortunately the wound reopened as soon as she came out of the cast within just [name] couple of weeks. 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 This is also part of the reason why she falls on [name] regular basis. I just do not believe that is gonna be safe for [place] to reinitiate the total contact cast as last time we had this on she fell 3 times one week which is definitely not normal for her. 09/18/18 upon evaluation today the patient  actually appears to be doing about the same in regard to her toe ulcer. She did not contact [facility] as I asked her to even though I had given her the prescription. In fact she actually states that she has no idea where the prescription is. She did apparently call [facility] and they told her that all she needed to do was bring the prescription in order to be able to be seen and work on getting the AFO for her left foot. With all that being said she still does not have an appointment and I'm not sure were things stand that regard. I will give her [name] new prescription today in order to contact them to get this set up. SEHAM, GARDENHIRE [name]. ([id number]) 09/25/18 on evaluation today patient actually appears to be doing about the same in regard to her toes ulcer. She does have [name] small areas which seems to have [name] lot of callous buildup around the edge of the wound which is going to need sharp debridement today. She still is waiting to be scheduled for evaluation with [facility] for possibility of an AFO. She states there supposed to call her tomorrow to get this set up. Unfortunately it does appear that her foot specifically the toe area is showing signs of erythema. There does not appear to be any systemic infection which is in these good news. 10/02/18 on evaluation today patient actually appears to be doing about the same in regard to her toe ulcer. This really has not done too well although it's not significantly larger it's also not significantly smaller. She has been tolerating the dressing changes without complication. She actually has her appointment with [facility] and [facility] tomorrow to hopefully be measured for  obtaining and AFO splint. I think this would be helpful preventing this from reoccurring. We had contemplated starting the cast this week although to be honest I am reluctant to do that as she's been having nausea, vomiting, and seizure activity over the past three days. She has [name] history of seizures and have been told is nothing that can be done for these. With that being said I do believe that along with the seizures have the nausea vomiting which upon further questioning doesn't seem to be the normal for her and makes me concerned for the possibility of infection or something else going on. I discussed this with the patient and her mother during the office visit today. I do not feel the wound is effective but maybe something else. The responses this was "this just happens to her at times and we don't know why". They did not seem to be interested in going to the hospital to have this checked out further. 10/09/18 on evaluation today patient presents for follow-up concerning her ongoing toe ulcer. She has been tolerating the dressing changes without complication. Fortunately there does not appear to be any evidence of infection which is great news however I do think that the patient would benefit from going ahead for with the total contact cast. She's actually in [name] wheelchair today she tells me that she will use her walker if we initiate the cast. I was very specific about the fact that if we were gonna do the cast I wanted to make sure that she was using the walker in order to prevent any falls. She tells me she does not have stairs that she has to traverse on [name] regular basis at her home. She has not had any seizures since last week again that something  that happens to her often she tells me she did talk to [employer] and they said that it may take up to three weeks to get the brace approved for her. Hopefully that will not take that long but nonetheless in the meantime I do think the cast could be  of benefit. 10/12/18 on evaluation today patient appears to be doing rather well in regard to her toe ulcer. It's just been [name] few days and already this is significantly improved both as far as overall appearance and size. Fortunately there's no sign of infection. She is here for her first obligatory cast change. 10/19/18 Seen today for follow up and management of left great toe ulcer. Wound continues to show improvement. Noted small open area with seroussang drainage with palpation. Denies any increased pain or recent fevers during visit. She will continue calcium alginate with offloading shoe. Denies any questions or concerns during visit. 10/26/18 on evaluation today patient appears to be doing about the same as when I last saw her in regard to her wound bed. Fortunately there does not appear to be any signs of infection. Unfortunately she continues to have [name] breakdown in regard to the toe region any time that she is not in the cast. It takes almost no time at all for this to happen. Nonetheless she still has not heard anything from the brace being made by [employer] as to when exactly this will be available to her. Fortunately there is no signs of infection at this time. 10/30/18 on evaluation today patient presents for application of the total contact cast as we just received him this morning. Fortunately we are gonna be able to apply this to her today which is great news. She continues to have no significant pain which is good news. Overall I do feel like things have been improving while she was the cast is when she doesn't have [name] cast that things get worse. She still has not really heard anything from [facility] regarding her brace. 11/02/18 upon evaluation today patient's wound already appears to be doing significantly better which is good news. Fortunately there does not appear to be any signs of infection also good news. Overall I do think the total contact cast as before is helping to heal this area  unfortunately it's just not gonna likely keep the area closed and healed without her getting her brace at least. Again the foot drop is [name] significant issue for her. 11/09/18 on evaluation today patient appears to be doing excellent in regard to her toe ulcer which in fact is completely healed. Fortunately we finally got the situation squared away with the paperwork which was needed to proceed with getting her brace approved by Medicaid. I have filled that out unfortunately that information has been sent to the orthopedic office that I worked at 2 1/2 years ago and not tired Current wound care measures. Fortunately she seems to be doing very well at this time. 11/23/18 on evaluation today patient appears to be doing More Poorly Compared to Last Time I Saw Her. At [facility] She Had Completely Healed. Currently she is continuing to have issues with reopening. She states that she just found out that the brace was approved through Medicaid now she just has to go get measured in order to have this fitted for her and then made. Subsequently she does not have an appointment for this yet that is going to complicate things we obviously cannot put her back in the cast if we do  not have everything measured because they're not gonna be able to measure her foot while she is in the cast. 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 Obviously this is unfortunate and does have me somewhat worried as well. 11/30/18 on evaluation today patient's toe ulcer actually appears to be doing fairly well. The good news is she will be getting her brace in the shoes next week on Wednesday. Hopefully we will be able to get this to heal without having to go back in the cast however she may need the cast in order to get the wound completely heal and then go from there. Fortunately there's no signs of infection at this time. 12/07/18 on  evaluation today patient fortunately did receive her brace and she states she could tell this definitely makes her walk better. With that being said she's been having issues with her toe where she noticed yesterday there was [name] lot of tissue that was loosing off this appears to be much larger than what it was previous. She also states that her leg has been read putting much across the top of her foot just about the ankle although this seems to be receiving somewhat. The total area is still red and appears to be someone infected as best I can tell. She is previously taken Bactrim and that may be [name] good option for her today as well. We are gonna see what I wound culture shows as well and I think that this is definitely appropriate. With that being said outside of the culture I still need to initiate something in the interim and that's what I'm gonna go ahead and select Bactrim is [name] good option for her. 12/14/18 on evaluation today patient appears to be doing better in regard to her left great toe ulcer as compared to last week's evaluation. There's still some erythema although this is significantly improved which is excellent news. Overall I do believe that she is making good progress is still gonna take some time before she is where I would like her to be from the standpoint of being able to place her back into the total contact cast. Hopefully we will be where we need to be by next week. 12/21/18 on evaluation today patient actually appears to be doing poorly in regard to her toe ulcer. She's been tolerating the dressing changes without complication. Fortunately there's no signs of systemic infection although she does have [name] lot of drainage from the toe ulcer and this does Mordan, Yovanna [name]. ([id number]) seem to be causing some issues at this point. She does have erythema on the distal portion of her toe that appears to be likely cellulitis. 12/28/18 on evaluation today patient actually appears to be doing [name]  little better in my pinion in regard to her toe ulcer. With that being said she still does have some evidence of infection at this time and for her culture she had both E. coli as well as enterococcus as organisms noted on evaluation. For that reason I think that though the Keflex likely has treated the E. coli rather well this has really done nothing for the enterococcus. We are going to have to initiate treatment for this specifically. 01/04/19 on evaluation today patient's toe actually appears to be doing better from the standpoint of infection. She currently would like to see about putting the cash back on I think that  this is appropriate as long as she takes care of it and keeps it from getting wet. She is gonna have some drainage we can definitely pass this up with Drawtex and alginate to try to prevent as much drainage as possible from causing the problems. With that being said I do want to at least try her with the cast between now and Tuesday. If there any issues we can't continue to use it then I will discontinue the use of the cast at that point. 01/08/19 on evaluation today patient actually appears to be doing very well as far as her foot ulcer specifically the great toe on the left is concerned. She did have an area of rubbing on the medial aspect of her left ankle which again is from the cast. Fortunately there's no signs of infection at this point in this appears to be [name] very slight skin breakdown. The patient tells me she felt it rubbing but didn't think it was that bad. Fortunately there is no signs of active infection at this time which is good news. No fevers, chills, nausea, or vomiting noted at this time. 01/15/19 on evaluation today patient actually appears to be doing well in regard to her toe ulcer. Again as previous she seems to do well and she has the cast on which indicates to me that during the time she doesn't have [name] cast on she's putting way too much pressure on this  region. Obviously I think that's gonna be an issue as with the current national emergency concerning the Covid-19 Virus it has been recommended that we discontinue the use of total contact casting by the chief medical officer of our company, Dr. Simona Huh. The reasoning is that if [name] patient becomes sick and cannot come into have the cast removed they could not just leave this on for an additional two weeks. Obviously the hospitals also do not want to receive patient's who are sick into the emergency department to potentially contaminate the region and spread the Covid-19 Virus among other sick individuals within the hospital system. Therefore at this point we are suspending the use of total contact cast until the current emergency subsides. This was all discussed with the patient today as well. 01/22/19 on evaluation today patient's wound on her left great toe appears to be doing slightly worse than previously noted last week. She tells me that she has been on this quite [name] bit in fact she tells me she's been awake for 38 straight hours. This is due to the fact that she's having to care for grandparents because nobody else will. She has been taking care of them for five the last seven days since I've seen her they both have dementia his is from [name] stroke and her grandmother's was progressive. Nonetheless she states even her mom who knows her condition and situation has only help two of those days to take care of them she's been taking care of the rest. Fortunately there does not appear to be any signs of active infection in regard to her toe at this point although obviously it doesn't look as good as it did previous. I think this is directly related to her not taking off the pressure and friction by way of taking things easy. Though I completely understand what's going on. 01/29/19 on evaluation today patient's tools are actually appears to be showing some signs of improvement today compared to last  week's evaluation as far as not necessarily the overall size of the wound  but the fact that she has some new skin growth in between the two ends of the wound opening. Overall I feel like she has done well she states that she had [name] family member give her what sounds to be [name] CAM walker boot which has been helpful as well. 02/05/19 on evaluation today patient's wound bed actually appears to be doing significantly better in regard to her overall appearance of the size of the wound. With that being said she is still having an issue with offloading efficiently enough to get this to close. Apparently there is some signs of infection at this point as well unfortunately. Previously she's done well of Augmentin I really do not see anything that needs to be culture currently but there theme and cellulitis of the foot that I'm seeing I'm gonna go ahead and place her on an antibiotic today to try to help clear this up. 02/12/2019 on evaluation today patient actually appears to be doing poorly in regard to her overall wound status. She tells me she has been using her offloading shoe but actually comes in today wearing her tennis shoe with the AFO brace. Again as I previously discussed with her this is really not sufficient to allow the area to heal appropriately. Nonetheless she continues to be somewhat noncompliant and I do wonder based on what she has told my nurse in the past as to whether or not [name] good portion of this noncompliance may be recreational drug and alcohol related. She has had [name] history of heroin overdose and this was fairly recently in the past couple of months that have been seeing her. Nonetheless overall I feel like her wound looks significantly worse today compared to what it was previous. She still has significant erythema despite the Augmentin I am not sure that this is an appropriate medication for her infection I am also concerned that the infection may have gone down into her bone. 02/19/19  on evaluation today patient actually appears to be doing about the same in regard to her toe ulcer. Unfortunately she continues to show signs of bone exposure and infection at this point. There does not appear to be any evidence of worsening of the infection but I'm also not really sure that it's getting significantly better. She is on the Augmentin which should be sufficient for the Staphylococcus aureus infection that she has at this point. With that being said she may need IV antibiotics to more appropriately treat this. We did have [name] discussion today about hyperbaric option therapy. 02/28/19 on evaluation today patient actually appears to be doing much worse in regard to the wound on her left great toe as compared to even my previous evaluation last week. Unfortunately this seems to be training in [name] pretty poor direction. Her toe was actually now starting to angle laterally and I can actually see the entire joint area of the proximal portion of the digit where is the distal portion of the digit again is no longer even in contact with the joint line. Unfortunately there's [name] lot more necrotic tissue around the edge and the toe appears to be showing signs of becoming gangrenous in my pinion. I'm very concerned about were things stand at this point. She did see infectious disease and they are planning to send in [name] prescription for Sivextro for her and apparently this has been approved. With that being said I don't think she should avoid taking this but at the same time I'm not sure that it's  gonna be sufficient to save her toe at this point. She tells me that she still having to care for grandparents which I think is putting quite [name] bit of strain on her foot and specifically the total area and has caused this to break down even to [name] greater degree than would've otherwise been expected. 03/05/19 on evaluation today patient actually appears to be doing quite well in regard to her toe all things considering.  She still has bone exposed but there appears to be much less your thing on overall the appearance of the wound and the toe itself is dramatically improved. She still does have some issues currently obviously with infection she did see vascular as well and there concerned that her blood flow to the toad. For that reason they are setting up for an angiogram next week. 03/14/19 on evaluation today patient appears to be doing very poor in regard to her toe and specifically in regard to the ulceration and the fact that she's starting to notice the toe was leaning even more towards the lateral aspect and the complete joint is visible on the proximal aspect of the joint. Nonetheless she's also noted [name] significant odor and the tip of the toe is turning more dark and necrotic appearing. Overall I think she is getting worse not better as far as this is concerned. For that reason I am recommending at this point that she likely needs to be seen for likely amputation. [name], [name] ([id number]) READMISSION 03/19/2021 This is [name] patient that we cared for in this clinic for [name] prolonged period of time in 2019 and 2020 with [name] left foot and left first toe wound. 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 Luana Shu. In December 2021 she had an ulcer on her right great toe as well as the fourth and fifth toes. 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 She also had an angiogram at that time and underwent angioplasty of the right anterior tibial artery. In any case she claims that the wound on the right foot is closed I did not look at this today which was probably an oversight although I think that should be done next week. 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 According to Dr. Deborra Medina last review that she was out of the  area being cared for by another physician but recently came back to his attention. 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 [name] culture of this area showed E. coli apparently before she came back to see Dr. Luana Shu she was supposed to be receiving antibiotics but she did not really take them. Nor is she offloading this area at all. Finally her last hemoglobin A1c listed in epic was in March 2022 at 14.1 she says things are [name] lot better since then although I am not sure. 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 She had this wound at the time. During this admission she had E. coli sepsis [name] TEE was negative. Past medical history is extensive and includes type 2 diabetes with peripheral neuropathy cardiomyopathy with an ejection fraction of 33%, hypertension, hyperlipidemia chronic renal failure stage III history of substance abuse with cocaine although she claims to be clean now verified by her  mother. She is still [name] heavy cigarette smoker. She has [name] history of bipolar disorder seizure disorder ABI in our clinic was 1.05 6/1; left midfoot in the setting of [name] TMA done previously. Round circular wound with [name] "knuckle" of protruding tissue. The problem is that the knuckle was not attached to any of the surrounding granulation and this probed proximally widely I removed [name] large portion of this tissue. This wound goes with considerable undermining laterally. I do not feel any bone there was no purulence but this is [name] deep wound. 6/8; in spite of the debridement I did last week. She arrives with [name] wound looking exactly the same. [name] protruding "knuckle" of tissue nonadherent to most of the surrounding tissue. There is considerable depth around this from 6-12 o'clock at 2.7 cm and undermining of 1 cm. This does not look overtly infected and the x-ray I did last week was negative for any osseous abnormalities. We  have been using silver collagen 6/15; deep tissue culture I did last week showed moderate staph aureus and moderate Pseudomonas. This will definitely require prolonged antibiotic therapy. The pathology on the protuberant area was negative for malignancy fungus etc. 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 We have been using silver collagen. I am going to be prescribing Levaquin for 2 weeks. Her CT scan of the foot is down for 7/5 6/22; CT scan of the foot on 7 5. She says she has hardware in the left leg from her previous fracture. She is on the Levaquin for the deep tissue culture I did that showed methicillin sensitive staph aureus and Pseudomonas. I gave her [name] 2-week supply and she will have another week. She arrives in clinic today with the same protuberant tissue however this is nonadherent to the tissue surrounding it. I am really at [name] loss to explain this unless there is underlying deep tissue infection 6/29; patient presents for 1 week follow-up. She has been using collagen to the wound bed. She reports taking her antibiotics as prescribed.She has no complaints or issues today. She denies signs of infection. 7/6; patient presents for one week followup. She has been using collagen to the wound bed. She states she is taking Levaquin however at times she is not able to keep it down. She denies signs of infection. 7/13; patient presents for 1 week follow-up. She has been using silver alginate to the wound bed. She still has nausea when taking her antibiotics. She denies signs of infection. 7/20; patient presents for 1 week follow-up. She has been using silver alginate with gentamicin cream to the wound bed. She denies any issues and has no complaints today. She denies signs of infection. 7/27; patient presents for 1 week follow-up. She continues to use silver alginate with gentamicin cream to the wound bed. She reports starting her  antibiotics. She has no issues or complaints. Overall she reports stability to the wound. 8/3; patient presents for 1 week follow-up. She has been using silver alginate with gentamicin cream to the wound bed. She reports completing all antibiotics. She has no issues or complaints today. She denies signs of infection. 8/17; patient presents for 2-week follow-up. He is to use silver alginate to the wound bed. She has no issues or complaints today. She denies signs of infection. She reports her pain has improved in her foot since last clinic visit 8/24; patient presents for 1 week follow-up. She continues to use silver alginate to the wound  bed. She has no issues or complaints. She denies signs of infection. Pain is stable. 9/7; patient presents for follow-up. She missed her last week appointment due to feeling ill. She continues to use silver alginate. She has [name] new wound to the right lower extremity that is covered in eschar. She states It occurred over the past week and has no idea how it started. She currently denies signs of infection. 9/14; patient presents for follow-up. To the left foot wound she has been using gentamicin cream and silver alginate. To the right lower extremity wound she has been keeping this covered and has not obtain Santyl. 9/21; patient presents for follow-up. She reports using gentamicin cream and silver alginate to the left foot and Santyl to the right lower extremity wound. She has no issues or complaints today. She denies signs of infection. 9/28; patient presents for follow-up. She reports [name] new wound to her right heel. She states this occurred [name] few days ago and is progressively gotten worse. She has been trying to clean the area with [name] Q-tip and Santyl. She reports stability in the other 2 wounds. She has been using gentamicin cream and silver alginate to the left foot and Santyl to the right lower extremity wound. 10/12; patient presents for follow-up. She reports  improvement to the wound beds. She is seeing vein and vascular to discuss the potential of [name] left BKA. She states they are going to do an arteriogram. She continues to use silver alginate with dressing changes to her wounds. 11/2; patient presents for follow-up. She states she has not been doing dressing changes to the wound beds. She states she is not able to offload the areas. She reports chronic pain to her left foot wound. 11/9; patient presents for follow-up. She came in with only socks on. She states she forgot to put on shoes. It is unclear if she is doing any dressing changes. She currently denies systemic signs of infection. 11/16; patient presents for follow-up. She came again only with socks on. She states she does not wear shoes ever. It is unclear if she does dressing changes. She currently denies systemic signs of infection. 11/23; patient presents for follow-up. She wore her shoes today. It still unclear exactly what dressing she is using for each wound but she did states she obtained Dakin's solution and has been using this to the left foot wound. She currently denies signs of infection. [name], [name] ([id number]) 11/30; patient presents for follow-up. She has no issues or complaints today. She currently denies signs of infection. 12/7; patient presents for follow-up. She has no issues or complaints today. She has been using Hydrofera Blue to the right heel wound and Dakin solution to the left foot wound. Her right anterior leg wound is healed. She currently denies signs of infection. 12/14; patient presents for follow-up. She has been using Hydrofera Blue to the right heel and Dakin's to the left foot wounds. She has no issues or complaints today. She denies signs of infection. 12/21; patient presents for follow-up. She reports using Hydrofera Blue to the right heel and Dakin's to the left foot wound. She denies signs of infection. 12/28; patient presents for follow-up. She  continues to use Dakin's to the left foot wound and Hydrofera Blue to the right heel wound. She denies signs of infection. 1/4; patient presents for follow-up. She has no issues or complaints today. She denies signs of infection. 1/11; patient presents for follow-up. It is unclear if she has  been dressing these wounds over the past week. She currently denies signs of infection. 1/18; patient presents for follow-up. She states she has been using Dakin's wet-to-dry dressings to the left foot. She has been using Hydrofera Blue to the right foot foot wound. She states that the anterior right leg wound has reopened and draining serous fluid. She denies signs of infection. 1/25; patient presents for follow-up. She has no issues or complaints today. 2/1; patient presents for follow-up. She has no issues or complaints today. She denies signs of infection. 2/8; patient presents for follow-up. She has lost her surgical shoes. She did not have [name] dressing to the right heel wound. She currently denies signs of infection. 2/15; patient presents for follow-up. She reports more pain to the right heel today. She denies purulent drainage Or fever/chills 2/22; patient presents for follow-up. She reports taking clindamycin over the past week. She states that she continues to have pain to her right heel. She reports purulent drainage. Readmission 03/16/2022 Ms. Iveth Heidemann is [name] 46 year old female with [name] past medical history of type 2 diabetes, osteomyelitis to her feet, chronic systolic heart failure and bipolar disorder that presents to the clinic for bilateral feet wounds and right lower extremity wound. She was last seen in our clinic on 12/15/2021. At that time she had purulent drainage coming out of her right plantar foot and I recommended she go to the ED. She states she went to [facility] and has been there for the past 3 months. I cannot see the records. She states she had OR debridement and was  on several weeks of IV antibiotics while inpatient. Since discharge she has not been taking care of the wound beds. She had nothing on her feet other than socks today. She currently denies signs of infection. 5/31; patient presents for follow-up. She has been using Dakin's wet-to-dry dressings to the wound beds on her feet bilaterally and antibiotic ointment to the right anterior leg wound. She had [name] wound culture done at last clinic visit that showed moderate Pseudomonas aeruginosa sensitive to ciprofloxacin. She currently denies systemic signs of infection. 6/14; patient presents for follow-up. She received Keystone 5 days ago and has been using this on the wound beds. She states that last week she had to go to the hospital because she had increased warmth and erythema to the right foot. She was started on 2 oral antibiotics. She states she has been taking these. She currently denies systemic signs of infection. She has no issues or complaints today. 6/21; patient presents for follow-up. She states she has been using Keystone antibiotics to the wound beds. She has no issues or complaints today. She denies signs of infection. 6/28; patient presents for follow-up. She has been using Keystone antibiotics to the wound beds. She has no issues or complaints today. 7/12; patient presents for follow-up. Has been using Keystone antibiotics to the wound beds with calcium alginate. She has no issues or complaints today. She never followed up with her orthopedic surgeon who did the OR debridement to the right foot. We discussed the total contact cast for the left foot and patient would like to do this next week. 7/19; patient presents for follow-up. She has been using Keystone antibiotics with calcium alginate to the wound beds. She has no issues or complaints today. Patient is in agreement to do the total contact cast of the left foot today. She knows to return later this week for the obligatory cast  change. 05-13-2022  upon evaluation today patient's wound which she has the cast of the left leg actually appears to be doing significantly better. Fortunately I do not see any signs of active infection locally or systemically which is great news and overall I am extremely pleased with where we stand currently. 7/26; patient presents for follow-up. She has [name] cast in place for the past week. She states it irritated her shin. Other than that she tolerated the cast well. She states she would like [name] break for 1 week from the cast. We have been using Keystone antibiotic and Aquacel to both wound beds. She denies signs of infection. 8/2; patient presents for follow-up. She has been using Keystone and Aquacel to the wound beds. She denies any issues and has no complaints. She is agreeable to have the cast placed today for the left leg. 06-03-2022 upon evaluation today patient appears to be doing well with regard to her wound she saw some signs of improvement which is great news. Fortunately I do not see any evidence of active infection locally or systemically at this time which is great news. No fevers, chills, nausea, vomiting, or diarrhea. NINETTE, COTTA [name]. ([id number]) 8/16; patient presents for follow-up. She has no issues or complaints today. We have been using Keystone and Aquacel to the wound beds. The left lower extremity is in [name] total contact cast. She is tolerated this well. 8/23; patient presents for follow-up. She has had the total contact cast on the left leg for the past week. Unfortunately this has rubbed and broken down the skin to the medial foot. She currently denies signs of infection. She has been using Keystone antibiotic to the right plantar foot wound. 8/30; patient presents for follow-up. We have held off on the total contact cast for the left leg for the past week. Her wound on the left foot has improved and the previous surrounding breakdown of skin has epithelialized. She has been  using Keystone antibiotic to both wound beds. She has no issues or complaints today. She denies signs of infection. 9/6; patient presents for follow-up. She has ordered her's Keystone antibiotic and this is arriving this week. She has been doing Dakin's wet-to- dry dressings to the wound beds. She denies signs of infection. She is agreeable to the total contact cast today. Objective Constitutional Vitals Time Taken: 10:33 AM, Height: 69 in, Weight: 178 lbs, BMI: 26.3, Temperature: 98.5 F, Pulse: 59 bpm, Respiratory Rate: 16 breaths/min, Blood Pressure: 115/76 mmHg. General Notes: Right foot: To the plantar heel there is an incision site with increased depth. Nonviable tissue at the surface, callus present. No probing to bone. Left foot: To the medial aspect there is an open wound with granulation tissue and nonviable tissue with surrounding callus. Integumentary (Hair, Skin) Wound #11 status is Open. Original cause of wound was Surgical Injury. The date acquired was: 12/01/2021. The wound has been in treatment 15 weeks. The wound is located on the [place]. The wound measures 0.7cm length x 0.4cm width x 0.5cm depth; 0.22cm^2 area and 0.11cm^3 volume. There is Fat Layer (Subcutaneous Tissue) exposed. There is [name] medium amount of serosanguineous drainage noted. The wound margin is distinct with the outline attached to the wound base. There is medium (34-66%) pink, pale granulation within the wound bed. There is [name] medium (34-66%) amount of necrotic tissue within the wound bed including Adherent Slough. Wound #12 status is Open. Original cause of wound was Pressure Injury. The date acquired was: 03/16/2020. The wound has been  in treatment 15 weeks. The wound is located on the [place]. The wound measures 3.6cm length x 1.6cm width x 0.1cm depth; 4.524cm^2 area and 0.452cm^3 volume. There is Fat Layer (Subcutaneous Tissue) exposed. There is [name] medium amount of serous drainage  noted. The wound margin is flat and intact. There is large (67-100%) red, pink granulation within the wound bed. There is [name] small (1-33%) amount of necrotic tissue within the wound bed including Adherent Slough. 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 I debrided nonviable tissue. No signs of surrounding soft tissue infection. I recommended Hydrofera Blue to the left foot along with the total contact cast. Once Keystone topical antibiotic arrives she can start this with calcium alginate to the right foot. She can continue Dakin's wet-to-dry dressings to the wound for the wound for now. Follow-up in 1 week. Procedures Wound #11 Pre-procedure diagnosis of Wound #11 is an Open Surgical Wound located on the Right,Plantar Foot . There was [name] Excisional Skin/Subcutaneous Tissue Debridement with [name] total area of 1 sq cm performed by Kalman Shan, MD. With the following instrument(s): Curette to remove Viable Harroun, Riyanshi [name]. ([id number]) and Non-Viable tissue/material. Material removed includes Callus, Subcutaneous Tissue, and Slough. [name] time out was conducted at 10:07, prior to the start of the procedure. [name] Minimum amount of bleeding was controlled with Pressure. The procedure was tolerated well. Post Debridement Measurements: 0.7cm length x 0.4cm width x 0.5cm depth; 0.11cm^3 volume. Character of Wound/Ulcer Post Debridement is stable. Post procedure Diagnosis Wound #11: Same as Pre-Procedure Wound #12 Pre-procedure diagnosis of Wound #12 is [name] Diabetic Wound/Ulcer of the  Lower Extremity located on the Left,Medial,Plantar Foot .Severity of Tissue Pre Debridement is: Fat layer exposed. There was [name] Excisional Skin/Subcutaneous Tissue Debridement with [name] total area of 8 sq cm performed by Kalman Shan, MD. With the following instrument(s): Curette to remove Viable and Non-Viable tissue/material. Material removed includes Callus, Subcutaneous Tissue, and Slough. [name] time out was conducted at 10:54, prior to the start of the procedure. [name] Minimum amount of bleeding was controlled with Pressure. The procedure was tolerated well. Post Debridement Measurements: 3.6cm length x 1.6cm width x 0.2cm depth; 0.905cm^3 volume. Character of Wound/Ulcer Post Debridement is stable. Severity of Tissue Post Debridement is: Fat layer exposed. Post procedure Diagnosis Wound #12: Same as Pre-Procedure Pre-procedure diagnosis of Wound #12 is [name] Diabetic Wound/Ulcer of the Lower Extremity located on the Left,Medial,Plantar Foot . There was [name] Total Contact Cast Procedure by Kalman Shan, MD. Post procedure Diagnosis Wound #12: Same as Pre-Procedure Plan Follow-up Appointments: Return Appointment in 1 week. Nurse Visit as needed Bathing/ Shower/ Hygiene: Wash wounds with antibacterial soap and water. No tub bath. Anesthetic (Use 'Patient Medications' Section for Anesthetic Order Entry): Lidocaine applied to wound bed Edema Control - Lymphedema / Segmental Compressive Device / Other: Elevate, Exercise Daily and Avoid Standing for Long Periods of Time. Elevate legs to the level of the heart and pump ankles as often as possible Elevate leg(s) parallel to the floor when sitting. Off-Loading: Total Contact Cast to Left Lower Extremity - contact cast #4 applied Don't get cast wet  Other: - keep pressure off of feet. Medications-Please add to medication list.: [employer] - Keystone gel Topical Antibiotic - AandD ointment to broken down area WOUND #11: - Foot Wound Laterality:  Plantar, Right Cleanser: Soap and Water 1 x Per Day/30 Days Discharge Instructions: Gently cleanse wound with antibacterial soap, rinse and pat dry prior to dressing wounds Topical: keystone gel 1 x Per Day/30 Days Primary Dressing: Aquacel Extra Hydrofiber Dressing, 2x2 (in/in) 1 x Per Day/30 Days Secondary Dressing: ABD Pad 5x9 (in/in) (Generic) 1 x Per Day/30 Days Discharge Instructions: Cover with ABD pad Secured With: Medipore Tape - 32M Medipore H Soft Cloth Surgical Tape, 2x2 (in/yd) (Generic) 1 x Per Day/30 Days Secured With: Kerlix Roll Sterile or Non-Sterile 6-ply 4.5x4 (yd/yd) (Generic) 1 x Per Day/30 Days Discharge Instructions: Apply Kerlix as directed WOUND #12: - Foot Wound Laterality: Plantar, Left, Medial Cleanser: Soap and Water 1 x Per Day/30 Days Discharge Instructions: Gently cleanse wound with antibacterial soap, rinse and pat dry prior to dressing wounds Topical: Vitamin AandD Ointment, 4(oz) tube 1 x Per Day/30 Days Discharge Instructions: apply to red, broken down area Topical: keystone gel 1 x Per Day/30 Days Discharge Instructions: apply very thin layer to wound Primary Dressing: Hydrofera Blue Ready Transfer Foam, 2.5x2.5 (in/in) 1 x Per Day/30 Days Discharge Instructions: Apply Hydrofera Blue Ready to wound bed as directed Secondary Dressing: ABD Pad 5x9 (in/in) (Generic) 1 x Per Day/30 Days Discharge Instructions: Cover with ABD pad Secured With: Medipore Tape - 32M Medipore H Soft Cloth Surgical Tape, 2x2 (in/yd) (Generic) 1 x Per Day/30 Days Secured With: Kerlix Roll Sterile or Non-Sterile 6-ply 4.5x4 (yd/yd) (Generic) 1 x Per Day/30 Days Discharge Instructions: Apply Kerlix as directed 1. In office sharp debridement 2. Total contact cast placed in standard fashion to the left lower extremity with hydrofera blue 3. Keystone antibiotic and calcium alginate to the right foot Dandridge, Reid [name]. ([id number]) 4.Aggressive offloading with surgical shoe to the right  foot 5. Follow-up in 1 week Electronic Signature(s) Signed: 06/29/2022 12:34:47 PM By: Kalman Shan DO Entered By: Kalman Shan on 06/29/2022 12:19:15 [name] ([id number]) -------------------------------------------------------------------------------- ROS/PFSH Details Patient Name: [name] Date of Service: 06/29/2022 10:30 AM Medical Record Number: [id number] Patient Account Number: [id number] Date of Birth/Sex: May 02, 1976 (46 y.o. F) Treating RN: Cornell Barman Primary Care Provider: Raelene Bott Other Clinician: Massie Kluver Referring Provider: Raelene Bott Treating Provider/Extender: Yaakov Guthrie in Treatment: 15 Information Obtained From Patient Ear/Nose/Mouth/Throat Medical History: Positive for: Chronic sinus problems/congestion; Middle ear problems Hematologic/Lymphatic Medical History: Positive for: Anemia Respiratory Medical History: Positive for: Chronic Obstructive Pulmonary Disease (COPD) Cardiovascular Medical History: Positive for: Congestive Heart Failure - EF 33% Endocrine Medical History: Positive for: Type II Diabetes Time with diabetes: 14 years Treated with: Insulin, Oral agents Blood sugar tested every day: No Blood sugar testing results: Bedtime: 176 Genitourinary Medical History: Positive for: End Stage Renal Disease Integumentary (Skin) Medical History: Positive for: History of pressure wounds Neurologic Medical History: Positive for: Neuropathy - feel and legs HBO Extended History Items Ear/Nose/Mouth/Throat: Ear/Nose/Mouth/Throat: Chronic sinus problems/congestion Middle ear problems Immunizations Pneumococcal Vaccine: Received Pneumococcal Vaccination: No Bento, Demya [name]. ([id number]) Implantable Devices No devices added Family and Social History Current every day smoker; Marital Status - Single; Alcohol Use: Never; Drug Use: Prior History; Caffeine Use: Daily Electronic Signature(s) Signed:  06/29/2022 12:34:47 PM By: Kalman Shan DO Signed: 06/30/2022 9:40:40 AM By: Gretta Cool, BSN, RN, CWS, [name] RN, BSN Entered By: Kalman Shan on 06/29/2022  12:19:51 [name], [name] [name]. ([id number]) -------------------------------------------------------------------------------- Total Contact Cast Details Patient Name: EHMANN, Floriene [name]. Date of Service: 06/29/2022 10:30 AM Medical Record Number: [id number] Patient Account Number: [id number] Date of Birth/Sex: [birth year] (46 y.o. F) Treating RN: Cornell Barman Primary Care Provider: Raelene Bott Other Clinician: Massie Kluver Referring Provider: Raelene Bott Treating Provider/Extender: Yaakov Guthrie in Treatment: 15 Total Contact Cast Applied for Wound Assessment: Wound #12 Left,Medial,Plantar Foot Performed By: Physician Kalman Shan, MD Post Procedure Diagnosis Same as Pre-procedure Electronic Signature(s) Signed: 06/29/2022 12:34:47 PM By: Kalman Shan DO Signed: 06/29/2022 5:12:58 PM By: Massie Kluver Entered By: Massie Kluver on 06/29/2022 11:01:50 Guardino, Amoura [name]. ([id number]) -------------------------------------------------------------------------------- [place] Details Patient Name: [name] Date of Service: 06/29/2022 Medical Record Number: [id number] Patient Account Number: [id number] Date of Birth/Sex: 05/05/1976 (46 y.o. 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.621 Type 2 diabetes mellitus with foot ulcer E11.42 Type 2 diabetes mellitus with diabetic polyneuropathy L97.811 Non-pressure chronic ulcer of other part of  right lower leg limited to breakdown of skin Facility Procedures CPT4 Code: 41324401 Description: 02725 - DEB SUBQ TISSUE 20 SQ CM/< Modifier: Quantity: 1 CPT4 Code: Description: ICD-10 Diagnosis Description 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 L97.512 Non-pressure chronic ulcer of other part of right foot with fat layer expo E11.621 Type 2 diabetes mellitus with foot  ulcer Modifier: severity sed Quantity: Physician Procedures CPT4 Code: 3664403 Description: 11042 - WC PHYS SUBQ TISS 20 SQ CM Modifier: Quantity: 1 CPT4 Code: Description: ICD-10 Diagnosis Description 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 L97.512 Non-pressure chronic ulcer of other part of right foot with fat layer expo E11.621 Type 2 diabetes mellitus with foot  ulcer Modifier: severity sed Quantity: Electronic Signature(s) Signed: 06/29/2022 12:34:47 PM By: Kalman Shan DO Entered By: Kalman Shan on 06/29/2022 12:19:30

## 2022-07-06 ENCOUNTER — Encounter (HOSPITAL_BASED_OUTPATIENT_CLINIC_OR_DEPARTMENT_OTHER): Payer: Medicaid Other | Admitting: Internal Medicine

## 2022-07-06 DIAGNOSIS — E11621 Type 2 diabetes mellitus with foot ulcer: Secondary | ICD-10-CM

## 2022-07-06 DIAGNOSIS — L97528 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ity: Secondary | ICD-10-CM

## 2022-07-06 DIAGNOSIS — L97512 Non-pressure chronic ulcer of other part of right foot with fat layer exposed: Secondary | ICD-10-CM

## 2022-07-07 NOTE — Progress Notes (Addendum)
[name], MELLO ([id number]) Visit Report for 07/06/2022 Arrival Information Details Patient Name: [name], [name] Date of Service: 07/06/2022 9:45 AM Medical Record Number: [id number] Patient Account Number: [id number] Date of Birth/Sex: 03-09-1976 (46 y.o. F) Treating RN: Cornell Barman Primary Care Orit Sanville: Raelene Bott Other Clinician: Massie Kluver Referring Jamilla Galli: Raelene Bott Treating Adylynn Hertenstein/Extender: Yaakov Guthrie in Treatment: 16 Visit Information History Since Last Visit All ordered tests and consults were completed: No Patient Arrived: Wheel Chair Added or deleted any medications: No Arrival Time: 10:05 Any new allergies or adverse reactions: No Transfer Assistance: None Had a fall or experienced change in Yes activities of daily living that may affect risk of falls: Hospitalized since last visit: No Pain Present Now: Yes Notes patient fell while had seizure last week, she denies any injury Electronic Signature(s) Signed: 07/06/2022 5:04:43 PM By: Massie Kluver Entered By: Massie Kluver on 07/06/2022 10:13:09 [name] ([id number]) -------------------------------------------------------------------------------- Clinic Level of Care Assessment Details Patient Name: Heather Dear A. Date of Service: 07/06/2022 9:45 AM Medical Record Number: [id number] Patient Account Number: [id number] Date of Birth/Sex: 1976-06-25 (46 y.o. F) Treating RN: Cornell Barman Primary Care Fahima Cifelli: Raelene Bott Other Clinician: Massie Kluver Referring Dalonte Hardage: Raelene Bott Treating Shantil Vallejo/Extender: Yaakov Guthrie in Treatment: 16 Clinic Level of Care Assessment Items TOOL 1 Quantity Score []  - Use when EandM and Procedure is performed on INITIAL visit 0 ASSESSMENTS - Nursing Assessment / Reassessment []  - General Physical Exam (combine w/ comprehensive assessment (listed just below) when performed on new 0 pt. evals) []  - 0 Comprehensive  Assessment (HX, ROS, Risk Assessments, Wounds Hx, etc.) ASSESSMENTS - Wound and Skin Assessment / Reassessment []  - Dermatologic / Skin Assessment (not related to wound area) 0 ASSESSMENTS - Ostomy and/or Continence Assessment and Care []  - Incontinence Assessment and Management 0 []  - 0 Ostomy Care Assessment and Management (repouching, etc.) PROCESS - Coordination of Care []  - Simple Patient / Family Education for ongoing care 0 []  - 0 Complex (extensive) Patient / Family Education for ongoing care []  - 0 Staff obtains Programmer, systems, Records, Test Results / Process Orders []  - 0 Staff telephones HHA, Nursing Homes / Clarify orders / etc []  - 0 Routine Transfer to another Facility (non-emergent condition) []  - 0 Routine Hospital Admission (non-emergent condition) []  - 0 New Admissions / Biomedical engineer / Ordering NPWT, Apligraf, etc. []  - 0 Emergency Hospital Admission (emergent condition) PROCESS - Special Needs []  - Pediatric / Minor Patient Management 0 []  - 0 Isolation Patient Management []  - 0 Hearing / Language / Visual special needs []  - 0 Assessment of Community assistance (transportation, D/C planning, etc.) []  - 0 Additional assistance / Altered mentation []  - 0 Support Surface(s) Assessment (bed, cushion, seat, etc.) INTERVENTIONS - Miscellaneous []  - External ear exam 0 []  - 0 Patient Transfer (multiple staff / Civil Service fast streamer / Similar devices) []  - 0 Simple Staple / Suture removal (25 or less) []  - 0 Complex Staple / Suture removal (26 or more) []  - 0 Hypo/Hyperglycemic Management (do not check if billed separately) []  - 0 Ankle / Brachial Index (ABI) - do not check if billed separately Has the patient been seen at the hospital within the last three years: Yes Total Score: 0 Level Of Care: ____ [name] ([id number]) Electronic Signature(s) Signed: 07/06/2022 5:04:43 PM By: Massie Kluver Entered By: Massie Kluver on 07/06/2022  10:44:40 [name], [name] ([id number]) -------------------------------------------------------------------------------- Complex / Palliative Patient Assessment Details Patient Name: Heather Dear A. Date  of Service: 07/06/2022 9:45 AM Medical Record Number: [id number] Patient Account Number: [id number] Date of Birth/Sex: 1976/08/05 (46 y.o. F) Treating RN: Cornell Barman Primary Care Iyanla Eilers: Raelene Bott Other Clinician: Massie Kluver Referring Demetric Parslow: Raelene Bott Treating Aniela Caniglia/Extender: Yaakov Guthrie in Treatment: 16 Complex Wound Management Criteria Patient has remarkable or complex co-morbidities requiring medications or treatments that extend wound healing times. Examples: o Diabetes mellitus with chronic renal failure or end stage renal disease requiring dialysis o Advanced or poorly controlled rheumatoid arthritis o Diabetes mellitus and end stage chronic obstructive pulmonary disease o Active cancer with current chemo- or radiation therapy multiple comorbidities Palliative Wound Management Criteria Care Approach Wound Care Plan: Complex Wound Management Electronic Signature(s) Signed: 07/08/2022 1:38:24 PM By: Gretta Cool, BSN, RN, CWS, [name] RN, BSN Signed: 07/15/2022 12:21:27 PM By: Kalman Shan DO Entered By: Gretta Cool, BSN, RN, CWS, [name] on 07/08/2022 13:38:23 [name] ([id number]) -------------------------------------------------------------------------------- Encounter Discharge Information Details Patient Name: Heather Dear A. Date of Service: 07/06/2022 9:45 AM Medical Record Number: [id number] Patient Account Number: [id number] Date of Birth/Sex: 01/05/1976 (46 y.o. F) Treating RN: Cornell Barman Primary Care Trampus Mcquerry: Raelene Bott Other Clinician: Massie Kluver Referring Vidal Lampkins: Raelene Bott Treating Safari Cinque/Extender: Yaakov Guthrie in Treatment: 16 Encounter Discharge Information Items Post Procedure Vitals Discharge  Condition: Stable Temperature (F): 98.0 Ambulatory Status: Wheelchair Pulse (bpm): 71 Discharge Destination: Home Respiratory Rate (breaths/min): 16 Transportation: Private Auto Blood Pressure (mmHg): 108/71 Accompanied By: mother Schedule Follow-up Appointment: No Clinical Summary of Care: Electronic Signature(s) Signed: 07/06/2022 5:14:15 PM By: Massie Kluver Entered By: Massie Kluver on 07/06/2022 17:13:39 [name], [name] ([id number]) -------------------------------------------------------------------------------- Lower Extremity Assessment Details Patient Name: Heather Dear A. Date of Service: 07/06/2022 9:45 AM Medical Record Number: [id number] Patient Account Number: [id number] Date of Birth/Sex: June 20, 1976 (46 y.o. F) Treating RN: Cornell Barman Primary Care Mireille Lacombe: Raelene Bott Other Clinician: Massie Kluver Referring Avinash Maltos: Raelene Bott Treating Disaya Walt/Extender: Yaakov Guthrie in Treatment: 16 Electronic Signature(s) Signed: 07/06/2022 5:04:43 PM By: Massie Kluver Signed: 07/07/2022 11:19:49 AM By: Gretta Cool, BSN, RN, CWS, [name] RN, BSN Entered By: Massie Kluver on 07/06/2022 10:29:42 [name] ([id number]) -------------------------------------------------------------------------------- Multi Wound Chart Details Patient Name: Heather Dear A. Date of Service: 07/06/2022 9:45 AM Medical Record Number: [id number] Patient Account Number: [id number] Date of Birth/Sex: 02-May-1976 (46 y.o. F) Treating RN: Cornell Barman Primary Care Kash Mothershead: Raelene Bott Other Clinician: Massie Kluver Referring Charlea Nardo: Raelene Bott Treating Lia Vigilante/Extender: Yaakov Guthrie in Treatment: 16 Vital Signs Height(in): 18 Pulse(bpm): 42 Weight(lbs): 178 Blood Pressure(mmHg): 108/71 Body Mass Index(BMI): 26.3 Temperature(F): 98.3 Respiratory Rate(breaths/min): 16 Photos: [N/A:N/A] Wound Location: Right, Plantar Foot Left, Medial, Plantar Foot  N/A Wounding Event: Surgical Injury Pressure Injury N/A Primary Etiology: Open Surgical Wound Diabetic Wound/Ulcer of the Lower N/A Extremity Comorbid History: Chronic sinus problems/congestion, Chronic sinus problems/congestion, N/A Middle ear problems, Anemia, Middle ear problems, Anemia, Chronic Obstructive Pulmonary Chronic Obstructive Pulmonary Disease (COPD), Congestive Heart Disease (COPD), Congestive Heart Failure, Type II Diabetes, End Stage Failure, Type II Diabetes, End Stage Renal Disease, History of pressure Renal Disease, History of pressure wounds, Neuropathy wounds, Neuropathy Date Acquired: 12/01/2021 03/16/2020 N/A Weeks of Treatment: 16 16 N/A Wound Status: Open Open N/A Wound Recurrence: No No N/A Measurements L x W x D (cm) 0.6x0.3x0.6 2.5x1.1x0.1 N/A Area (cm) : 0.141 2.16 N/A Volume (cm) : 0.085 0.216 N/A % Reduction in Area: 92.40% 54.20% N/A % Reduction in Volume: 94.90% 77.10% N/A Classification: Full Thickness Without Exposed Grade 3 N/A Support Structures Exudate Amount: Medium Medium  N/A Exudate Type: Serosanguineous Serous N/A Exudate Color: red, brown amber N/A Wound Margin: Distinct, outline attached Flat and Intact N/A Granulation Amount: Medium (34-66%) Large (67-100%) N/A Granulation Quality: Pink, Pale Red, Pink N/A Necrotic Amount: Medium (34-66%) Small (1-33%) N/A Exposed Structures: Fat Layer (Subcutaneous Tissue): Fat Layer (Subcutaneous Tissue): N/A Yes Yes Fascia: No Fascia: No Tendon: No Tendon: No Muscle: No Muscle: No Joint: No Joint: No Bone: No Bone: No Epithelialization: Small (1-33%) Small (1-33%) N/A Treatment Notes [name], [name] ([id number]) Electronic Signature(s) Signed: 07/06/2022 5:04:43 PM By: Massie Kluver Entered By: Massie Kluver on 07/06/2022 10:30:03 [name] ([id number]) -------------------------------------------------------------------------------- [place] Details Patient  Name: Heather Dear A. Date of Service: 07/06/2022 9:45 AM Medical Record Number: [id number] Patient Account Number: [id number] Date of Birth/Sex: [birth year] (46 y.o. F) Treating RN: Cornell Barman Primary Care Ott Zimmerle: Raelene Bott Other Clinician: Massie Kluver Referring Dennys Traughber: Raelene Bott Treating Krisy Dix/Extender: Yaakov Guthrie in Treatment: 31 Active Inactive Abuse / Safety / Falls / Self Care Management Nursing Diagnoses: History of Falls Potential for falls Potential for injury related to falls Goals: Patient will not develop complications from immobility Date Initiated: 03/17/2022 Date Inactivated: 04/20/2022 Target Resolution Date: 03/16/2022 Goal Status: Met Patient/caregiver will verbalize understanding of skin care regimen Date Initiated: 03/17/2022 Target Resolution Date: 03/16/2022 Goal Status: Active Patient/caregiver will verbalize/demonstrate measure taken to improve self care Date Initiated: 03/17/2022 Target Resolution Date: 03/16/2022 Goal Status: Active Interventions: Assess fall risk on admission and as needed Provide education on basic hygiene Provide education on personal and home safety Notes: Necrotic Tissue Nursing Diagnoses: Impaired tissue integrity related to necrotic/devitalized tissue Knowledge deficit related to management of necrotic/devitalized tissue Goals: Necrotic/devitalized tissue will be minimized in the wound bed Date Initiated: 03/17/2022 Date Inactivated: 05/04/2022 Target Resolution Date: 03/16/2022 Goal Status: Met Patient/caregiver will verbalize understanding of reason and process for debridement of necrotic tissue Date Initiated: 03/17/2022 Target Resolution Date: 03/16/2022 Goal Status: Active Interventions: Provide education on necrotic tissue and debridement process Treatment Activities: Apply topical anesthetic as ordered : 03/16/2022 Excisional debridement : 03/16/2022 Notes: Osteomyelitis Nursing  Diagnoses: Infection: osteomyelitis Knowledge deficit related to disease process and management [name], [name] ([id number]) Goals: Patient/caregiver will verbalize understanding of disease process and disease management Date Initiated: 03/17/2022 Date Inactivated: 04/20/2022 Target Resolution Date: 03/16/2022 Goal Status: Met Patient's osteomyelitis will resolve Date Initiated: 03/17/2022 Target Resolution Date: 03/16/2022 Goal Status: Active Signs and symptoms for osteomyelitis will be recognized and promptly addressed Date Initiated: 03/17/2022 Target Resolution Date: 03/16/2022 Goal Status: Active Interventions: Assess for signs and symptoms of osteomyelitis resolution every visit Provide education on osteomyelitis Treatment Activities: Systemic antibiotics : 03/16/2022 Notes: Wound/Skin Impairment Nursing Diagnoses: Impaired tissue integrity Knowledge deficit related to smoking impact on wound healing Knowledge deficit related to ulceration/compromised skin integrity Goals: Patient/caregiver will verbalize understanding of skin care regimen Date Initiated: 03/17/2022 Date Inactivated: 04/20/2022 Target Resolution Date: 03/16/2022 Goal Status: Met Ulcer/skin breakdown will have a volume reduction of 30% by week 4 Date Initiated: 03/17/2022 Target Resolution Date: 04/13/2022 Goal Status: Active Ulcer/skin breakdown will have a volume reduction of 50% by week 8 Date Initiated: 03/17/2022 Target Resolution Date: 05/04/2022 Goal Status: Active Ulcer/skin breakdown will have a volume reduction of 80% by week 12 Date Initiated: 03/17/2022 Target Resolution Date: 06/01/2022 Goal Status: Active Ulcer/skin breakdown will heal within 14 weeks Date Initiated: 03/17/2022 Target Resolution Date: 06/15/2022 Goal Status: Active Interventions: Assess ulceration(s) every visit Provide education on ulcer and skin care Treatment Activities: Skin care  regimen initiated :  03/16/2022 Notes: Electronic Signature(s) Signed: 07/06/2022 5:04:43 PM By: Massie Kluver Signed: 07/07/2022 11:19:49 AM By: Gretta Cool, BSN, RN, CWS, [name] RN, BSN Entered By: Massie Kluver on 07/06/2022 10:29:50 [name] ([id number]) -------------------------------------------------------------------------------- Pain Assessment Details Patient Name: Heather Dear A. Date of Service: 07/06/2022 9:45 AM Medical Record Number: [id number] Patient Account Number: [id number] Date of Birth/Sex: December 30, 1975 (46 y.o. F) Treating RN: Cornell Barman Primary Care Conny Moening: Raelene Bott Other Clinician: Massie Kluver Referring Waldemar Siegel: Raelene Bott Treating Baker Moronta/Extender: Yaakov Guthrie in Treatment: 16 Active Problems Location of Pain Severity and Description of Pain Patient Has Paino Yes Site Locations Pain Location: Pain in Ulcers Duration of the Pain. Constant / Intermittento Constant Rate the pain. Current Pain Level: 5 Character of Pain Describe the Pain: Aching, Burning Pain Management and Medication Current Pain Management: Medication: Yes Rest: Yes Electronic Signature(s) Signed: 07/06/2022 5:04:43 PM By: Massie Kluver Signed: 07/07/2022 11:19:49 AM By: Gretta Cool, BSN, RN, CWS, [name] RN, BSN Entered By: Massie Kluver on 07/06/2022 10:15:52 [name] ([id number]) -------------------------------------------------------------------------------- Patient/Caregiver Education Details Patient Name: Heather Dear A. Date of Service: 07/06/2022 9:45 AM Medical Record Number: [id number] Patient Account Number: [id number] Date of Birth/Gender: 09-17-1976 (46 y.o. F) Treating RN: Cornell Barman Primary Care Physician: Raelene Bott Other Clinician: Massie Kluver Referring Physician: Raelene Bott Treating Physician/Extender: Yaakov Guthrie in Treatment: 16 Education Assessment Education Provided To: Patient Education Topics Provided Wound/Skin  Impairment: Handouts: Other: continue wound care as directed Methods: Explain/Verbal Responses: State content correctly Electronic Signature(s) Signed: 07/06/2022 5:04:43 PM By: Massie Kluver Entered By: Massie Kluver on 07/06/2022 10:57:37 [name], [name] ([id number]) -------------------------------------------------------------------------------- Wound Assessment Details Patient Name: Heather Dear A. Date of Service: 07/06/2022 9:45 AM Medical Record Number: [id number] Patient Account Number: [id number] Date of Birth/Sex: Apr 29, 1976 (46 y.o. F) Treating RN: Cornell Barman Primary Care Osha Errico: Raelene Bott Other Clinician: Massie Kluver Referring Jovante Hammitt: Raelene Bott Treating Chantia Amalfitano/Extender: Yaakov Guthrie in Treatment: 16 Wound Status Wound Number: 11 Primary Open Surgical Wound Etiology: Wound Location: Right, Plantar Foot Wound Open Wounding Event: Surgical Injury Status: Date Acquired: 12/01/2021 Comorbid Chronic sinus problems/congestion, Middle ear problems, Weeks Of Treatment: 16 History: Anemia, Chronic Obstructive Pulmonary Disease (COPD), Clustered Wound: No Congestive Heart Failure, Type II Diabetes, End Stage Renal Disease, History of pressure wounds, Neuropathy Photos Wound Measurements Length: (cm) 0.6 Width: (cm) 0.3 Depth: (cm) 0.6 Area: (cm) 0.141 Volume: (cm) 0.085 % Reduction in Area: 92.4% % Reduction in Volume: 94.9% Epithelialization: Small (1-33%) Wound Description Classification: Full Thickness Without Exposed Support Structu Wound Margin: Distinct, outline attached Exudate Amount: Medium Exudate Type: Serosanguineous Exudate Color: red, brown res Foul Odor After Cleansing: No Slough/Fibrino Yes Wound Bed Granulation Amount: Medium (34-66%) Exposed Structure Granulation Quality: Pink, Pale Fascia Exposed: No Necrotic Amount: Medium (34-66%) Fat Layer (Subcutaneous Tissue) Exposed: Yes Necrotic Quality: Adherent  Slough Tendon Exposed: No Muscle Exposed: No Joint Exposed: No Bone Exposed: No Electronic Signature(s) Signed: 07/06/2022 5:04:43 PM By: Massie Kluver Signed: 07/07/2022 11:19:49 AM By: Gretta Cool, BSN, RN, CWS, [name] RN, BSN Entered By: Massie Kluver on 07/06/2022 10:28:50 [name], [name] ([id number]) -------------------------------------------------------------------------------- Wound Assessment Details Patient Name: Heather Dear A. Date of Service: 07/06/2022 9:45 AM Medical Record Number: [id number] Patient Account Number: [id number] Date of Birth/Sex: [birth year] (46 y.o. F) Treating RN: Cornell Barman Primary Care Zacharie Portner: Raelene Bott Other Clinician: Massie Kluver Referring Carnell Casamento: Raelene Bott Treating Janaya Broy/Extender: Yaakov Guthrie in Treatment: 16 Wound Status Wound Number: 12 Primary Diabetic Wound/Ulcer of the Lower Extremity  Etiology: Wound Location: Left, Medial, Plantar Foot Wound Open Wounding Event: Pressure Injury Status: Date Acquired: 03/16/2020 Comorbid Chronic sinus problems/congestion, Middle ear problems, Weeks Of Treatment: 16 History: Anemia, Chronic Obstructive Pulmonary Disease (COPD), Clustered Wound: No Congestive Heart Failure, Type II Diabetes, End Stage Renal Disease, History of pressure wounds, Neuropathy Photos Wound Measurements Length: (cm) 2.5 Width: (cm) 1.1 Depth: (cm) 0.1 Area: (cm) 2.16 Volume: (cm) 0.216 % Reduction in Area: 54.2% % Reduction in Volume: 77.1% Epithelialization: Small (1-33%) Wound Description Classification: Grade 3 Wound Margin: Flat and Intact Exudate Amount: Medium Exudate Type: Serous Exudate Color: amber Foul Odor After Cleansing: No Slough/Fibrino Yes Wound Bed Granulation Amount: Large (67-100%) Exposed Structure Granulation Quality: Red, Pink Fascia Exposed: No Necrotic Amount: Small (1-33%) Fat Layer (Subcutaneous Tissue) Exposed: Yes Necrotic Quality: Adherent Slough Tendon  Exposed: No Muscle Exposed: No Joint Exposed: No Bone Exposed: No Electronic Signature(s) Signed: 07/06/2022 5:04:43 PM By: Massie Kluver Signed: 07/07/2022 11:19:49 AM By: Gretta Cool, BSN, RN, CWS, [name] RN, BSN Entered By: Massie Kluver on 07/06/2022 10:29:32 [name] ([id number]) -------------------------------------------------------------------------------- [place] Details Patient Name: Heather Dear A. Date of Service: 07/06/2022 9:45 AM Medical Record Number: [id number] Patient Account Number: [id number] Date of Birth/Sex: May 11, 1976 (46 y.o. F) Treating RN: Cornell Barman Primary Care Loyal Rudy: Raelene Bott Other Clinician: Massie Kluver Referring Domingos Riggi: Raelene Bott Treating Lorre Opdahl/Extender: Yaakov Guthrie in Treatment: 16 Vital Signs Time Taken: 10:13 Temperature (F): 98.3 Height (in): 69 Pulse (bpm): 71 Weight (lbs): 178 Respiratory Rate (breaths/min): 16 Body Mass Index (BMI): 26.3 Blood Pressure (mmHg): 108/71 Reference Range: 80 - 120 mg / dl Electronic Signature(s) Signed: 07/06/2022 5:04:43 PM By: Massie Kluver Entered By: Massie Kluver on 07/06/2022 10:15:49

## 2022-07-07 NOTE — Progress Notes (Signed)
VALITA, RIGHTER ([id number]) Visit Report for 07/06/2022 Chief Complaint Document Details Patient Name: [name], [name] Date of Service: 07/06/2022 9:45 AM Medical Record Number: [id number] Patient Account Number: [id number] Date of Birth/Sex: [birth year] (46 y.o. F) Treating RN: Cornell Barman Primary Care Provider: Raelene Bott Other Clinician: Massie Kluver Referring Provider: Raelene Bott Treating Provider/Extender: Yaakov Guthrie in Treatment: 16 Information Obtained from: Patient Chief Complaint 03/19/2021; patient referred by Dr. Luana Shu who has been looking after her left foot for quite a period of time for review of a nonhealing area in the left midfoot 03/12/2022; bilateral feet wounds and right lower extremity wound. Electronic Signature(s) Signed: 07/06/2022 10:57:22 AM By: Kalman Shan DO Entered By: Kalman Shan on 07/06/2022 10:50:27 [name] ([id number]) -------------------------------------------------------------------------------- Debridement Details Patient Name: Heather Dear A. Date of Service: 07/06/2022 9:45 AM Medical Record Number: [id number] Patient Account Number: [id number] Date of Birth/Sex: 1976-02-08 (46 y.o. F) Treating RN: Cornell Barman Primary Care Provider: Raelene Bott Other Clinician: Massie Kluver Referring Provider: Raelene Bott Treating Provider/Extender: Yaakov Guthrie in Treatment: 16 Debridement Performed for Wound #12 Left,Medial,Plantar Foot Assessment: Performed By: Physician Kalman Shan, MD Debridement Type: Debridement Severity of Tissue Pre Debridement: Fat layer exposed Level of Consciousness (Pre- Awake and Alert procedure): Pre-procedure Verification/Time Out Yes - 10:35 Taken: Start Time: 10:35 Total Area Debrided (L x W): 3 (cm) x 2 (cm) = 6 (cm) Tissue and other material Viable, Non-Viable, Callus, Slough, Subcutaneous, Slough debrided: Level: Skin/Subcutaneous  Tissue Debridement Description: Excisional Instrument: Curette Bleeding: Minimum Hemostasis Achieved: Pressure End Time: 10:40 Response to Treatment: Procedure was tolerated well Level of Consciousness (Post- Awake and Alert procedure): Post Debridement Measurements of Total Wound Length: (cm) 2.5 Width: (cm) 1.1 Depth: (cm) 0.1 Volume: (cm) 0.216 Character of Wound/Ulcer Post Debridement: Stable Severity of Tissue Post Debridement: Fat layer exposed Post Procedure Diagnosis Same as Pre-procedure Electronic Signature(s) Signed: 07/06/2022 10:57:22 AM By: Kalman Shan DO Signed: 07/06/2022 5:04:43 PM By: Massie Kluver Signed: 07/07/2022 11:19:49 AM By: Gretta Cool, BSN, RN, CWS, [name] RN, BSN Entered By: Massie Kluver on 07/06/2022 10:42:21 [name], [name] ([id number]) -------------------------------------------------------------------------------- Debridement Details Patient Name: Heather Dear A. Date of Service: 07/06/2022 9:45 AM Medical Record Number: [id number] Patient Account Number: [id number] Date of Birth/Sex: 03/30/1976 (46 y.o. F) Treating RN: Cornell Barman Primary Care Provider: Raelene Bott Other Clinician: Massie Kluver Referring Provider: Raelene Bott Treating Provider/Extender: Yaakov Guthrie in Treatment: 16 Debridement Performed for Wound #11 Right,Plantar Foot Assessment: Performed By: Physician Kalman Shan, MD Debridement Type: Debridement Level of Consciousness (Pre- Awake and Alert procedure): Pre-procedure Verification/Time Out Yes - 10:43 Taken: Start Time: 10:43 Total Area Debrided (L x W): 0.8 (cm) x 0.4 (cm) = 0.32 (cm) Tissue and other material Viable, Non-Viable, Callus, Slough, Subcutaneous, Slough debrided: Level: Skin/Subcutaneous Tissue Debridement Description: Excisional Instrument: Curette Bleeding: Minimum Hemostasis Achieved: Pressure End Time: 10:45 Response to Treatment: Procedure was tolerated well Level of  Consciousness (Post- Awake and Alert procedure): Post Debridement Measurements of Total Wound Length: (cm) 0.6 Width: (cm) 0.3 Depth: (cm) 0.6 Volume: (cm) 0.085 Character of Wound/Ulcer Post Debridement: Stable Post Procedure Diagnosis Same as Pre-procedure Electronic Signature(s) Signed: 07/06/2022 10:57:22 AM By: Kalman Shan DO Signed: 07/06/2022 5:04:43 PM By: Massie Kluver Signed: 07/07/2022 11:19:49 AM By: Gretta Cool, BSN, RN, CWS, [name] RN, BSN Entered By: Massie Kluver on 07/06/2022 10:44:16 [name], [name] ([id number]) -------------------------------------------------------------------------------- HPI Details Patient Name: Heather Dear A. Date of Service: 07/06/2022 9:45 AM Medical Record Number: [id number] Patient Account Number: [id number] Date  of Birth/Sex: [birth year] (46 y.o. F) Treating RN: Cornell Barman Primary Care Provider: Raelene Bott Other Clinician: Massie Kluver Referring Provider: Raelene Bott Treating Provider/Extender: Yaakov Guthrie in Treatment: 16 History of Present Illness HPI Description: 01/18/18-She is here for initial evaluation of the left great toe ulcer. She is a poor historian in regards to timeframe in detail. She states approximately 4 weeks ago she lacerated her toe on something in the house. She followed up with her primary care who placed her on Bactrim and ultimately a second dose of Bactrim prior to coming to wound clinic. She states she has been treating the toe with peroxide, Betadine and a Band-Aid. She did not check her blood sugar this morning but checked it yesterday morning it was 327; she is unaware of a recent A1c and there are no current records. She saw Dr. she would've orthopedics last week for an old injury to the left ankle, she states he did not see her toe, nor did she bring it to his attention. She smokes approximately 1 pack cigarettes a day. Her social situation is concerning, she arrives this morning with her  mother who appears extremely intoxicated/under the influence; her mother was asked to leave the room and be monitored by the patient's grandmother. The patient's aunt then accompanied the patient and the room throughout the rest of the appointment. We had a lengthy discussion regarding the deleterious effects of uncontrolled hyperglycemia and smoking as it relates to wound healing and overall health. She was strongly encouraged to decrease her smoking and get her diabetes under better control. She states she is currently on a diet and has cut down her [facility] consumption. The left toe is erythematous, macerated and slightly edematous with malodor present. The edema in her left foot is below her baseline, there is no erythema streaking. We will treat her with Santyl, doxycycline; we have ordered and xray, culture and provided a Peg assist surgical shoe and cultured the wound. 01/25/18-She is here in follow-up evaluation for a left great toe ulcer and presents with an abscess to her suprapubic area. She states her blood sugars remain elevated, feeling "sick" and if levels are below 250, but she is trying. She has made no attempt to decrease her smoking stating that we "can't take away her food in her cigarettes". She has been compliant with offloading using the PEG assist you. She is using Santyl daily. the culture obtained last week grew staph aureus and Enterococcus faecalis; continues on the doxycycline and Augmentin was added on Monday. The suprapubic area has erythema, no femoral variation, purple discoloration, minimal induration, was accessed with a cotton tip applicator with sanguinopurulent drainage, this was cultured, I suspect the current antibiotic treatment will cover and we will not add anything to her current treatment plan. She was advised to go to urgent care or ER with any change in redness, induration or fever. 02/01/18-She is here in follow-up evaluation for left great toe ulcers  and a new abdominal abscess from last week. She was able to use packing until earlier this week, where she "forgot it was there". She states she was feeling ill with GI symptoms last week and was not taking her antibiotic. She states her glucose levels have been predominantly less than 200, with occasional levels between 200-250. She thinks this was contributing to her GI symptoms as they have resolved without intervention. There continues to be significant laceration to left toe, otherwise it clinically looks stable/improved. There is now less  superficial opening to the lateral aspect of the great toe that was residual blister. We will transition to [facility] to all wounds, she will continue her Augmentin. If there is no change or deterioration next week for reculture. 02/08/18-She is here in follow-up evaluation for left great toe ulcer and abdominal ulcer. There is an improvement in both wounds. She has been wrapping her left toe with coban, not by our direction, which has created an area of discoloration to the medial aspect; she has been advised to NOT use coban secondary to her neuropathy. She states her glucose levels have been high over this last week ranging from 200-350, she continues to smoke. She admits to being less compliant with her offloading shoe. We will continue with same treatment plan and she will follow-up next week. 02/15/18-She is here in follow-up evaluation for left great toe ulcer and abdominal ulcer. The abdominal ulcer is epithelialized. The left great toe ulcer is improved and all injury from last week using the Coban wrap is resolved, the lateral ulcer is healed. She admits to noncompliance with wearing offloading shoe and admits to glucose levels being greater than 300 most of the week. She continues to smoke and expresses no desire to quit. There is one area medially that probes deeper than it has historically, erythema to the toe and dorsal foot has consistently  waxed and waned. There is no overt signs of cellulitis or infection but we will culture the wound for any occult infection given the new area of depth and erythema. We will hold off on sensitivities for initiation of antibiotic therapy. 02/22/18-She is here in follow up evaluation for left great toe ulcer. There is overall significant improvement in both wound appearance, erythema and edema with changes made last week. She was not initiated on antibiotic therapy. Culture obtained last week showed oxacillin sensitive staph aureus, sensitive to clindamycin. Clindamycin has been called into the pharmacy but she has been instructed to hold off on initiation secondary to overall clinical improvement and her history of antibiotic intolerance. She has been instructed to contact the clinic with any noted changes/deterioration and the wound, erythema, edema and/or pain. She will follow-up next week. She continues to smoke and her glucose levels remain elevated >250; she admits to compliance with offloading shoe 03/01/18 on evaluation today patient appears to be doing fairly well in regard to her left first toe ulcer. She has been tolerating the dressing changes with the [facility] Dressing without complication and overall this has definitely showed signs of improvement according to records as well is what the patient tells me today. I'm very pleased in that regard. She is having no pain today 03/08/18 She is here for follow up evaluation of a left great toe ulcer. She remains non-compliant with glucose control and smoking cessation; glucose levels consistently >200. She states that she got new shoe inserts/peg assist. She admits to compliance with offloading. Since my last evaluation there is significant improvement. We will switch to prisma at this time and she will follow up next week. She is noted to be tachycardic at this appointment, heart rate 120s; she has a history of heart rate 70-130 according to our  records. She admits to extreme agitation r/t personal issues; she was advised to monitor her heartrate and contact her physician if it does not return to a more normal range (<100). She takes cardizem twice daily. 03/15/18-She is here in follow-up evaluation for left great toe ulcer. She remains noncompliant with glucose  control and smoking cessation. She admits to compliance with wearing offloading shoe. The ulcer is improved/stable and we will continue with the same treatment plan and she will follow-up next week 03/22/18-She is here for evaluation for left great toe ulcer. There continues to be significant improvement despite recurrent hyperglycemia (over 500 yesterday) and she continues to smoke. She has been compliant with offloading and we will continue with same treatment plan and she will follow-up next week. 03/29/18-She is here for evaluation for left great toe ulcer. Despite continuing to smoke and uncontrolled diabetes she continues to improve. She is compliant with offloading shoe. We will continue with the same treatment plan and she will follow-up next week 04/05/18- She is here in follow up evaluation for a left great toe ulcer; she presents with small pustule to left fifth toe (resembles ant bite). She admits to compliance with wearing offloading shoe; continues to smoke or have uncontrolled blood glucose control. There is more callus than usual with evidence of bleeding; she denies known trauma. 04/12/18-She is here for evaluation of left great toe ulcer. Despite noncompliance with glycemic control and smoking she continues to make Bertoli, Kamirah A. ([id number]) improvement. She continues to wear offloading shoe. The pustule, that was identified last week, to the left fifth toe is resolved. She will follow-up in 2 weeks 05/03/18-she is seen in follow-up evaluation for a left great toe ulcer. She is compliant with offloading, otherwise noncompliant with glycemic control and smoking. She  has plateaued and there is minimal improvement noted. We will transition to [facility], replaced the insert to her surgical shoe and she will follow-up in one week 05/10/18- She is here in follow up evaluation for a left great toe ulcer. It appears stable despite measurement change. We will continue with same treatment plan and follow up next week. 05/24/18-She is seen in follow-up evaluation for a left great toe ulcer. She remains compliant with offloading, has made significant improvement in her diet, decreasing the amount of sugar/soda. She said her recent A1c was 10.9 which is lower than. She did see a diabetic nutritionist/educator yesterday. She continues to smoke. We will continue with the same treatment plan and she'll follow-up next week. 05/31/18- She is seen in follow-up evaluation for left great toe ulcer. She continues to remain compliant with offloading, continues to make improvement in her diet, increasing her water and decreasing the amount of sugar/soda. She does continue to smoke with no desire to quit. We will apply Prisma to the depth and Hydrofera Blue over. We have not received insurance authorization for oasis. She will follow up next week. 06/07/18-She is seen in follow-up evaluation for left great toe ulcer. It has stalled according to today's measurements although base appears stable. She says she saw a diabetic educator yesterday; her average blood sugars are less than 300 which is an improvement for her. She continues to smoke and states "that's my next step" She continues with water over soda. We will order for xray, culture and reinstate ace wrap compression prior to placing apligraf for next week. She is voicing no complaints or concerns. Her dressing will change to iodoflex over the next week in preparation for apligraf. 06/14/18-She is seen in follow-up evaluation for left great toe ulcer. Plain film x-ray performed last week was negative for osteomyelitis. Wound culture  obtained last week grew strep B and OSSA; she is initiated on keflex and cefdinir today; there is erythema to the toe which could be from ace wrap compression,  she has a history of wrapping too tight and has has been encouraged to maintain ace wraps that we place today. We will hold off on application of apligraf today, will apply next week after antibiotic therapy has been initiated. She admits today that she has resumed taking a shower with her foot/toe submerged in water, she has been reminded to keep foot/toe out of the bath water. She will be seen in follow up next week 06/21/18-she is seen in follow-up evaluation for left great toe ulcer. She is tolerating antibiotic therapy with no GI disturbance. The wound is stable. Apligraf was applied today. She has been decreasing her smoking, only had 4 cigarettes yesterday and 1 today. She continues being more compliant in diabetic diet. She will follow-up next week for evaluation of site, if stable will remove at 2 weeks. 06/28/18- She is here in follow up evalution. Apligraf was placed last week, she states the dressing fell off on Tuesday and she was dressing with hydrofera blue. She is healed and will be discharged from the clinic today. She has been instructed to continue with smoking cessation, continue monitoring glucose levels, offloading for an additional 4 weeks and continue with hydrofera blue for additional two weeks for any possible microscopic opening. Readmission: 08/07/18 on evaluation today patient presents for reevaluation concerning the ulcer of her right great toe. She was previously discharged on 06/28/18 healed. Nonetheless she states that this began to show signs of drainage she subsequently went to her primary care provider. Subsequently an x-ray was performed on 08/01/18 which was negative. The patient was also placed on antibiotics at that time. Fortunately they should have been effective for the infection. Nonetheless she's been  experiencing some improvement but still has a lot of drainage coming from the wound itself. 08/14/18 on evaluation today patient's wound actually does show signs of improvement in regard to the erythema at this point. She has completed the antibiotics. With that being said we did discuss the possibility of placing her in a total contact cast as of today although I think that I may want to give this just a little bit more time to ensure nothing recurrence as far as her infection is concerned. I do not want to put in the cast and risk infection at that time if things are not completely resolved. With that being said she is gonna require some debridement today. 08/21/18 on evaluation today patient actually appears to be doing okay in regard to her toe ulcer. She's been tolerating the dressing changes without complication. With that being said it does appear that she is ready and in fact I think it's appropriate for [place] to go ahead and initiate the total contact cast today. Nonetheless she will require some sharp debridement to prepare the wound for application. Overall I feel like things have been progressing well but we do need to do something to get this to close more readily. 08/24/18 patient seen today for reevaluation after having had the total contact cast applied on Tuesday. She seems to have done very well the wound appears to be doing great and overall I'm pleased with the progress that she's made. There were no abnormal areas of rubbing from the cast on her lower extremity. 08/30/18 on evaluation today patient actually appears to be completely healed in regard to her plantar toe ulcer. She tells me at this point she's been having a lot of issues with the cast. She almost fell a couple of times the state shall the  step of her dog a couple times as well. This is been a very frustrating process for her other nonetheless she has completely healed the wound which is excellent news. Overall there does  not appear to be the evidence of infection at this time which is great news. 09/11/18 evaluation today patient presents for follow-up concerning her great toe ulcer on the left which has unfortunately reopened since I last saw her which was only a couple of weeks ago. Unfortunately she was not able to get in to get the shoe and potentially the AFO that's gonna be necessary due to her left foot drop. She continues with offloading shoe but this is not enough to prevent her from reopening it appears. When we last had her in the total contact cast she did well from a healing standpoint but unfortunately the wound reopened as soon as she came out of the cast within just a couple of weeks. 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 This is also part of the reason why she falls on a regular basis. I just do not believe that is gonna be safe for [place] to reinitiate the total contact cast as last time we had this on she fell 3 times one week which is definitely not normal for her. 09/18/18 upon evaluation today the patient actually appears to be doing about the same in regard to her toe ulcer. She did not contact [facility] as I asked her to even though I had given her the prescription. In fact she actually states that she has no idea where the prescription is. She did apparently call [facility] and they told her that all she needed to do was bring the prescription in order to be able to be seen and work on getting the AFO for her left foot. With all that being said she still does not have an appointment and I'm not sure were things stand that regard. I will give her a new prescription today in order to contact them to get this set up. 09/25/18 on evaluation today patient actually appears to be doing about the same in regard  to her toes ulcer. She does have a small areas which seems to have a lot of callous buildup around the edge of the wound which is going to need sharp debridement today. She still is waiting to be scheduled for evaluation with [facility] for possibility of an AFO. She states there supposed to call her tomorrow to get this set up. Unfortunately it does appear that her foot specifically the toe area is showing signs of erythema. There does not appear to be any systemic infection which is in these good news. [name], [name] ([id number]) 10/02/18 on evaluation today patient actually appears to be doing about the same in regard to her toe ulcer. This really has not done too well although it's not significantly larger it's also not significantly smaller. She has been tolerating the dressing changes without complication. She actually has her appointment with [facility] and [facility] tomorrow to hopefully be measured for obtaining and AFO splint. I think this would be helpful preventing this from reoccurring. We had contemplated starting the cast this week although to be honest I am reluctant to do that as she's been having nausea, vomiting, and seizure  activity over the past three days. She has a history of seizures and have been told is nothing that can be done for these. With that being said I do believe that along with the seizures have the nausea vomiting which upon further questioning doesn't seem to be the normal for her and makes me concerned for the possibility of infection or something else going on. I discussed this with the patient and her mother during the office visit today. I do not feel the wound is effective but maybe something else. The responses this was "this just happens to her at times and we don't know why". They did not seem to be interested in going to the hospital to have this checked out further. 10/09/18 on evaluation today patient presents for follow-up concerning her ongoing toe ulcer.  She has been tolerating the dressing changes without complication. Fortunately there does not appear to be any evidence of infection which is great news however I do think that the patient would benefit from going ahead for with the total contact cast. She's actually in a wheelchair today she tells me that she will use her walker if we initiate the cast. I was very specific about the fact that if we were gonna do the cast I wanted to make sure that she was using the walker in order to prevent any falls. She tells me she does not have stairs that she has to traverse on a regular basis at her home. She has not had any seizures since last week again that something that happens to her often she tells me she did talk to [employer] and they said that it may take up to three weeks to get the brace approved for her. Hopefully that will not take that long but nonetheless in the meantime I do think the cast could be of benefit. 10/12/18 on evaluation today patient appears to be doing rather well in regard to her toe ulcer. It's just been a few days and already this is significantly improved both as far as overall appearance and size. Fortunately there's no sign of infection. She is here for her first obligatory cast change. 10/19/18 Seen today for follow up and management of left great toe ulcer. Wound continues to show improvement. Noted small open area with seroussang drainage with palpation. Denies any increased pain or recent fevers during visit. She will continue calcium alginate with offloading shoe. Denies any questions or concerns during visit. 10/26/18 on evaluation today patient appears to be doing about the same as when I last saw her in regard to her wound bed. Fortunately there does not appear to be any signs of infection. Unfortunately she continues to have a breakdown in regard to the toe region any time that she is not in the cast. It takes almost no time at all for this to happen. Nonetheless she  still has not heard anything from the brace being made by [employer] as to when exactly this will be available to her. Fortunately there is no signs of infection at this time. 10/30/18 on evaluation today patient presents for application of the total contact cast as we just received him this morning. Fortunately we are gonna be able to apply this to her today which is great news. She continues to have no significant pain which is good news. Overall I do feel like things have been improving while she was the cast is when she doesn't have a cast that things get worse. She still has not  really heard anything from [place] regarding her brace. 11/02/18 upon evaluation today patient's wound already appears to be doing significantly better which is good news. Fortunately there does not appear to be any signs of infection also good news. Overall I do think the total contact cast as before is helping to heal this area unfortunately it's just not gonna likely keep the area closed and healed without her getting her brace at least. Again the foot drop is a significant issue for her. 11/09/18 on evaluation today patient appears to be doing excellent in regard to her toe ulcer which in fact is completely healed. Fortunately we finally got the situation squared away with the paperwork which was needed to proceed with getting her brace approved by Medicaid. I have filled that out unfortunately that information has been sent to the orthopedic office that I worked at 2 1/2 years ago and not tired Current wound care measures. Fortunately she seems to be doing very well at this time. 11/23/18 on evaluation today patient appears to be doing More Poorly Compared to Last Time I Saw Her. At [facility] She Had Completely Healed. Currently she is continuing to have issues with reopening. She states that she just found out that the brace was approved through Medicaid now she just has to go get measured in order to have this fitted for  her and then made. Subsequently she does not have an appointment for this yet that is going to complicate things we obviously cannot put her back in the cast if we do not have everything measured because they're not gonna be able to measure her foot while she is in the cast. 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 Obviously this is unfortunate and does have me somewhat worried as well. 11/30/18 on evaluation today patient's toe ulcer actually appears to be doing fairly well. The good news is she will be getting her brace in the shoes next week on Wednesday. Hopefully we will be able to get this to heal without having to go back in the cast however she may need the cast in order to get the wound completely heal and then go from there. Fortunately there's no signs of infection at this time. 12/07/18 on evaluation today patient fortunately did receive her brace and she states she could tell this definitely makes her walk better. With that being said she's been having issues with her toe where she noticed yesterday there was a lot of tissue that was loosing off this appears to be much larger than what it was previous. She also states that her leg has been read putting much across the top of her foot just about the ankle although this seems to be receiving somewhat. The total area is still red and appears to be someone infected as best I can tell. She is previously taken Bactrim and that may be a good option for her today as well. We are gonna see what I wound culture shows as well and I think that this is definitely appropriate. With that being said outside of the culture I still need to initiate something in the interim and that's what I'm gonna go ahead and select Bactrim is a good option for her. 12/14/18 on evaluation today patient appears to be doing better in regard to her left great toe ulcer as  compared to last week's evaluation. There's  still some erythema although this is significantly improved which is excellent news. Overall I do believe that she is making good progress is still gonna take some time before she is where I would like her to be from the standpoint of being able to place her back into the total contact cast. Hopefully we will be where we need to be by next week. 12/21/18 on evaluation today patient actually appears to be doing poorly in regard to her toe ulcer. She's been tolerating the dressing changes without complication. Fortunately there's no signs of systemic infection although she does have a lot of drainage from the toe ulcer and this does seem to be causing some issues at this point. She does have erythema on the distal portion of her toe that appears to be likely cellulitis. 12/28/18 on evaluation today patient actually appears to be doing a little better in my pinion in regard to her toe ulcer. With that being said she still does have some evidence of infection at this time and for her culture she had both E. coli as well as enterococcus as organisms noted on evaluation. For that reason I think that though the Keflex likely has treated the E. coli rather well this has really done nothing for the enterococcus. We are going to have to initiate treatment for this specifically. [name], [name] ([id number]) 01/04/19 on evaluation today patient's toe actually appears to be doing better from the standpoint of infection. She currently would like to see about putting the cash back on I think that this is appropriate as long as she takes care of it and keeps it from getting wet. She is gonna have some drainage we can definitely pass this up with Drawtex and alginate to try to prevent as much drainage as possible from causing the problems. With that being said I do want to at least try her with the cast between now and Tuesday. If there any issues we can't continue to use  it then I will discontinue the use of the cast at that point. 01/08/19 on evaluation today patient actually appears to be doing very well as far as her foot ulcer specifically the great toe on the left is concerned. She did have an area of rubbing on the medial aspect of her left ankle which again is from the cast. Fortunately there's no signs of infection at this point in this appears to be a very slight skin breakdown. The patient tells me she felt it rubbing but didn't think it was that bad. Fortunately there is no signs of active infection at this time which is good news. No fevers, chills, nausea, or vomiting noted at this time. 01/15/19 on evaluation today patient actually appears to be doing well in regard to her toe ulcer. Again as previous she seems to do well and she has the cast on which indicates to me that during the time she doesn't have a cast on she's putting way too much pressure on this region. Obviously I think that's gonna be an issue as with the current national emergency concerning the Covid-19 Virus it has been recommended that we discontinue the use of total contact casting by the chief medical officer of our company, Dr. Simona Huh. The reasoning is that if a patient becomes sick and cannot come into have the cast removed they could not just leave this on for an additional two weeks. Obviously the hospitals also do not want to receive patient's who are sick into the emergency  department to potentially contaminate the region and spread the Covid-19 Virus among other sick individuals within the hospital system. Therefore at this point we are suspending the use of total contact cast until the current emergency subsides. This was all discussed with the patient today as well. 01/22/19 on evaluation today patient's wound on her left great toe appears to be doing slightly worse than previously noted last week. She tells me that she has been on this quite a bit in fact she tells me she's  been awake for 38 straight hours. This is due to the fact that she's having to care for grandparents because nobody else will. She has been taking care of them for five the last seven days since I've seen her they both have dementia his is from a stroke and her grandmother's was progressive. Nonetheless she states even her mom who knows her condition and situation has only help two of those days to take care of them she's been taking care of the rest. Fortunately there does not appear to be any signs of active infection in regard to her toe at this point although obviously it doesn't look as good as it did previous. I think this is directly related to her not taking off the pressure and friction by way of taking things easy. Though I completely understand what's going on. 01/29/19 on evaluation today patient's tools are actually appears to be showing some signs of improvement today compared to last week's evaluation as far as not necessarily the overall size of the wound but the fact that she has some new skin growth in between the two ends of the wound opening. Overall I feel like she has done well she states that she had a family member give her what sounds to be a CAM walker boot which has been helpful as well. 02/05/19 on evaluation today patient's wound bed actually appears to be doing significantly better in regard to her overall appearance of the size of the wound. With that being said she is still having an issue with offloading efficiently enough to get this to close. Apparently there is some signs of infection at this point as well unfortunately. Previously she's done well of Augmentin I really do not see anything that needs to be culture currently but there theme and cellulitis of the foot that I'm seeing I'm gonna go ahead and place her on an antibiotic today to try to help clear this up. 02/12/2019 on evaluation today patient actually appears to be doing poorly in regard to her overall wound  status. She tells me she has been using her offloading shoe but actually comes in today wearing her tennis shoe with the AFO brace. Again as I previously discussed with her this is really not sufficient to allow the area to heal appropriately. Nonetheless she continues to be somewhat noncompliant and I do wonder based on what she has told my nurse in the past as to whether or not a good portion of this noncompliance may be recreational drug and alcohol related. She has had a history of heroin overdose and this was fairly recently in the past couple of months that have been seeing her. Nonetheless overall I feel like her wound looks significantly worse today compared to what it was previous. She still has significant erythema despite the Augmentin I am not sure that this is an appropriate medication for her infection I am also concerned that the infection may have gone down into her bone. 02/19/19 on  evaluation today patient actually appears to be doing about the same in regard to her toe ulcer. Unfortunately she continues to show signs of bone exposure and infection at this point. There does not appear to be any evidence of worsening of the infection but I'm also not really sure that it's getting significantly better. She is on the Augmentin which should be sufficient for the Staphylococcus aureus infection that she has at this point. With that being said she may need IV antibiotics to more appropriately treat this. We did have a discussion today about hyperbaric option therapy. 02/28/19 on evaluation today patient actually appears to be doing much worse in regard to the wound on her left great toe as compared to even my previous evaluation last week. Unfortunately this seems to be training in a pretty poor direction. Her toe was actually now starting to angle laterally and I can actually see the entire joint area of the proximal portion of the digit where is the distal portion of the digit again is no  longer even in contact with the joint line. Unfortunately there's a lot more necrotic tissue around the edge and the toe appears to be showing signs of becoming gangrenous in my pinion. I'm very concerned about were things stand at this point. She did see infectious disease and they are planning to send in a prescription for Sivextro for her and apparently this has been approved. With that being said I don't think she should avoid taking this but at the same time I'm not sure that it's gonna be sufficient to save her toe at this point. 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 03/05/19 on evaluation today patient actually appears to be doing quite well in regard to her toe all things considering. She still has bone exposed but there appears to be much less your thing on overall the appearance of the wound and the toe itself is dramatically improved. She still does have some issues currently obviously with infection she did see vascular as well and there concerned that her blood flow to the toad. For that reason they are setting up for an angiogram next week. 03/14/19 on evaluation today patient appears to be doing very poor in regard to her toe and specifically in regard to the ulceration and the fact that she's starting to notice the toe was leaning even more towards the lateral aspect and the complete joint is visible on the proximal aspect of the joint. Nonetheless she's also noted a significant odor and the tip of the toe is turning more dark and necrotic appearing. Overall I think she is getting worse not better as far as this is concerned. For that reason I am recommending at this point that she likely needs to be seen for likely amputation. READMISSION 03/19/2021 This is a patient that we  cared for in this clinic for a prolonged period of time in 2019 and 2020 with a left foot and left first toe wound. 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 ([id number]) 04/09/20 by Dr. Luana Shu. In December 2021 she had an ulcer on her right great toe as well as the fourth and  fifth toes. 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 She also had an angiogram at that time and underwent angioplasty of the right anterior tibial artery. In any case she claims that the wound on the right foot is closed I did not look at this today which was probably an oversight although I think that should be done next week. After her surgery she developed a dehiscence but I do not see any follow-up of this. According to Dr. Deborra Medina last review that she was out of the area being cared for by another physician but recently came back to his attention. 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 A culture of this area showed E. coli apparently before she came back to see Dr. Luana Shu she was supposed to be receiving antibiotics but she did not really take them. Nor is she offloading this area at all. Finally her last hemoglobin A1c listed in epic was in March 2022 at 14.1 she says things are a lot better since then although I am not sure. 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 She had this wound at the time. During this admission she had E. coli sepsis a TEE was negative. 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 She is  still a heavy cigarette smoker. She has a history of bipolar disorder seizure disorder ABI in our clinic was 1.05 6/1; left midfoot in the setting of a TMA done previously. Round circular wound with a "knuckle" of protruding tissue. The problem is that the knuckle was not attached to any of the surrounding granulation and this probed proximally widely I removed a large portion of this tissue. This wound goes with considerable undermining laterally. I do not feel any bone there was no purulence but this is a deep wound. 6/8; in spite of the debridement I did last week. She arrives with a wound looking exactly the same. A protruding "knuckle" of tissue nonadherent to most of the surrounding tissue. There is considerable depth around this from 6-12 o'clock at 2.7 cm and undermining of 1 cm. This does not look overtly infected and the x-ray I did last week was negative for any osseous abnormalities. We have been using silver collagen 6/15; deep tissue culture I did last week showed moderate staph aureus and moderate Pseudomonas. This will definitely require prolonged antibiotic therapy. The pathology on the protuberant area was negative for malignancy fungus etc. 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 We have been using silver collagen. I am going to be prescribing Levaquin for 2 weeks. Her CT scan of the foot is down for 7/5 6/22; CT scan of the foot on 7 5. She says she has hardware in the left leg from her previous fracture. She is on the Levaquin for the deep tissue culture I did that showed methicillin sensitive staph aureus and Pseudomonas. I gave her a 2-week supply and she will have another week. She arrives in clinic today with the same protuberant tissue however this is nonadherent to the tissue surrounding it. I am really at a loss to explain this unless there is underlying deep tissue infection 6/29; patient presents for  1 week  follow-up. She has been using collagen to the wound bed. She reports taking her antibiotics as prescribed.She has no complaints or issues today. She denies signs of infection. 7/6; patient presents for one week followup. She has been using collagen to the wound bed. She states she is taking Levaquin however at times she is not able to keep it down. She denies signs of infection. 7/13; patient presents for 1 week follow-up. She has been using silver alginate to the wound bed. She still has nausea when taking her antibiotics. She denies signs of infection. 7/20; patient presents for 1 week follow-up. She has been using silver alginate with gentamicin cream to the wound bed. She denies any issues and has no complaints today. She denies signs of infection. 7/27; patient presents for 1 week follow-up. She continues to use silver alginate with gentamicin cream to the wound bed. She reports starting her antibiotics. She has no issues or complaints. Overall she reports stability to the wound. 8/3; patient presents for 1 week follow-up. She has been using silver alginate with gentamicin cream to the wound bed. She reports completing all antibiotics. She has no issues or complaints today. She denies signs of infection. 8/17; patient presents for 2-week follow-up. He is to use silver alginate to the wound bed. She has no issues or complaints today. She denies signs of infection. She reports her pain has improved in her foot since last clinic visit 8/24; patient presents for 1 week follow-up. She continues to use silver alginate to the wound bed. She has no issues or complaints. She denies signs of infection. Pain is stable. 9/7; patient presents for follow-up. She missed her last week appointment due to feeling ill. She continues to use silver alginate. She has a new wound to the right lower extremity that is covered in eschar. She states It occurred over the past week and has no idea how it started.  She currently denies signs of infection. 9/14; patient presents for follow-up. To the left foot wound she has been using gentamicin cream and silver alginate. To the right lower extremity wound she has been keeping this covered and has not obtain Santyl. 9/21; patient presents for follow-up. She reports using gentamicin cream and silver alginate to the left foot and Santyl to the right lower extremity wound. She has no issues or complaints today. She denies signs of infection. 9/28; patient presents for follow-up. She reports a new wound to her right heel. She states this occurred a few days ago and is progressively gotten worse. She has been trying to clean the area with a Q-tip and Santyl. She reports stability in the other 2 wounds. She has been using gentamicin cream and silver alginate to the left foot and Santyl to the right lower extremity wound. 10/12; patient presents for follow-up. She reports improvement to the wound beds. She is seeing vein and vascular to discuss the potential of a left BKA. She states they are going to do an arteriogram. She continues to use silver alginate with dressing changes to her wounds. 11/2; patient presents for follow-up. She states she has not been doing dressing changes to the wound beds. She states she is not able to offload the areas. She reports chronic pain to her left foot wound. 11/9; patient presents for follow-up. She came in with only socks on. She states she forgot to put on shoes. It is unclear if she is doing any dressing changes. She currently denies systemic signs of  infection. 11/16; patient presents for follow-up. She came again only with socks on. She states she does not wear shoes ever. It is unclear if she does dressing changes. She currently denies systemic signs of infection. 11/23; patient presents for follow-up. She wore her shoes today. It still unclear exactly what dressing she is using for each wound but she did states she obtained  Dakin's solution and has been using this to the left foot wound. She currently denies signs of infection. 11/30; patient presents for follow-up. She has no issues or complaints today. She currently denies signs of infection. 12/7; patient presents for follow-up. She has no issues or complaints today. She has been using Hydrofera Blue to the right heel wound and Dakin solution to the left foot wound. Her right anterior leg wound is healed. She currently denies signs of infection. [name], [name] ([id number]) 12/14; patient presents for follow-up. She has been using Hydrofera Blue to the right heel and Dakin's to the left foot wounds. She has no issues or complaints today. She denies signs of infection. 12/21; patient presents for follow-up. She reports using Hydrofera Blue to the right heel and Dakin's to the left foot wound. She denies signs of infection. 12/28; patient presents for follow-up. She continues to use Dakin's to the left foot wound and Hydrofera Blue to the right heel wound. She denies signs of infection. 1/4; patient presents for follow-up. She has no issues or complaints today. She denies signs of infection. 1/11; patient presents for follow-up. It is unclear if she has been dressing these wounds over the past week. She currently denies signs of infection. 1/18; patient presents for follow-up. She states she has been using Dakin's wet-to-dry dressings to the left foot. She has been using Hydrofera Blue to the right foot foot wound. She states that the anterior right leg wound has reopened and draining serous fluid. She denies signs of infection. 1/25; patient presents for follow-up. She has no issues or complaints today. 2/1; patient presents for follow-up. She has no issues or complaints today. She denies signs of infection. 2/8; patient presents for follow-up. She has lost her surgical shoes. She did not have a dressing to the right heel wound. She currently denies signs of  infection. 2/15; patient presents for follow-up. She reports more pain to the right heel today. She denies purulent drainage Or fever/chills 2/22; patient presents for follow-up. She reports taking clindamycin over the past week. She states that she continues to have pain to her right heel. She reports purulent drainage. Readmission 03/16/2022 Ms. Avonda Toso is a 46 year old female with a past medical history of type 2 diabetes, osteomyelitis to her feet, chronic systolic heart failure and bipolar disorder that presents to the clinic for bilateral feet wounds and right lower extremity wound. She was last seen in our clinic on 12/15/2021. At that time she had purulent drainage coming out of her right plantar foot and I recommended she go to the ED. She states she went to [facility] and has been there for the past 3 months. I cannot see the records. She states she had OR debridement and was on several weeks of IV antibiotics while inpatient. Since discharge she has not been taking care of the wound beds. She had nothing on her feet other than socks today. She currently denies signs of infection. 5/31; patient presents for follow-up. She has been using Dakin's wet-to-dry dressings to the wound beds on her feet bilaterally and antibiotic ointment to the  right anterior leg wound. She had a wound culture done at last clinic visit that showed moderate Pseudomonas aeruginosa sensitive to ciprofloxacin. She currently denies systemic signs of infection. 6/14; patient presents for follow-up. She received Keystone 5 days ago and has been using this on the wound beds. She states that last week she had to go to the hospital because she had increased warmth and erythema to the right foot. She was started on 2 oral antibiotics. She states she has been taking these. She currently denies systemic signs of infection. She has no issues or complaints today. 6/21; patient presents for follow-up. She states  she has been using Keystone antibiotics to the wound beds. She has no issues or complaints today. She denies signs of infection. 6/28; patient presents for follow-up. She has been using Keystone antibiotics to the wound beds. She has no issues or complaints today. 7/12; patient presents for follow-up. Has been using Keystone antibiotics to the wound beds with calcium alginate. She has no issues or complaints today. She never followed up with her orthopedic surgeon who did the OR debridement to the right foot. We discussed the total contact cast for the left foot and patient would like to do this next week. 7/19; patient presents for follow-up. She has been using Keystone antibiotics with calcium alginate to the wound beds. She has no issues or complaints today. Patient is in agreement to do the total contact cast of the left foot today. She knows to return later this week for the obligatory cast change. 05-13-2022 upon evaluation today patient's wound which she has the cast of the left leg actually appears to be doing significantly better. Fortunately I do not see any signs of active infection locally or systemically which is great news and overall I am extremely pleased with where we stand currently. 7/26; patient presents for follow-up. She has a cast in place for the past week. She states it irritated her shin. Other than that she tolerated the cast well. She states she would like a break for 1 week from the cast. We have been using Keystone antibiotic and Aquacel to both wound beds. She denies signs of infection. 8/2; patient presents for follow-up. She has been using Keystone and Aquacel to the wound beds. She denies any issues and has no complaints. She is agreeable to have the cast placed today for the left leg. 06-03-2022 upon evaluation today patient appears to be doing well with regard to her wound she saw some signs of improvement which is great news. Fortunately I do not see any evidence  of active infection locally or systemically at this time which is great news. No fevers, chills, nausea, vomiting, or diarrhea. 8/16; patient presents for follow-up. She has no issues or complaints today. We have been using Keystone and Aquacel to the wound beds. The left lower extremity is in a total contact cast. She is tolerated this well. 8/23; patient presents for follow-up. She has had the total contact cast on the left leg for the past week. Unfortunately this has rubbed and broken down the skin to the medial foot. She currently denies signs of infection. She has been using Keystone antibiotic to the right plantar foot wound. [name], HOOPINGARNER A. ([id number]) 8/30; patient presents for follow-up. We have held off on the total contact cast for the left leg for the past week. Her wound on the left foot has improved and the previous surrounding breakdown of skin has epithelialized. She has been using Keystone  antibiotic to both wound beds. She has no issues or complaints today. She denies signs of infection. 9/6; patient presents for follow-up. She has ordered her's Keystone antibiotic and this is arriving this week. She has been doing Dakin's wet-to- dry dressings to the wound beds. She denies signs of infection. She is agreeable to the total contact cast today. 9/13; patient presents for follow-up. She states that the cast caused her left leg shin to be sore. She would like to take a break from the cast this week. She has been using Keystone antibiotic to the right plantar foot wound. She denies signs of infection. Electronic Signature(s) Signed: 07/06/2022 10:57:22 AM By: Kalman Shan DO Entered By: Kalman Shan on 07/06/2022 10:51:34 [name] ([id number]) -------------------------------------------------------------------------------- Physical Exam Details Patient Name: Heather Dear A. Date of Service: 07/06/2022 9:45 AM Medical Record Number: [id number] Patient Account  Number: [id number] Date of Birth/Sex: 11/20/1975 (46 y.o. F) Treating RN: Cornell Barman Primary Care Provider: Raelene Bott Other Clinician: Massie Kluver Referring Provider: Raelene Bott Treating Provider/Extender: Yaakov Guthrie in Treatment: 16 Constitutional . Cardiovascular . Psychiatric . Notes Right foot: To the plantar heel there is an incision site with increased depth. Nonviable tissue at the surface, callus present. No probing to bone. Left foot: To the medial aspect there is an open wound with granulation tissue and nonviable tissue with surrounding callus. Again no signs of infection. Electronic Signature(s) Signed: 07/06/2022 10:57:22 AM By: Kalman Shan DO Entered By: Kalman Shan on 07/06/2022 10:52:06 [name] ([id number]) -------------------------------------------------------------------------------- Physician Orders Details Patient Name: Heather Dear A. Date of Service: 07/06/2022 9:45 AM Medical Record Number: [id number] Patient Account Number: [id number] Date of Birth/Sex: [birth year] (46 y.o. F) Treating RN: Cornell Barman Primary Care Provider: Raelene Bott Other Clinician: Massie Kluver Referring Provider: Raelene Bott Treating Provider/Extender: Yaakov Guthrie in Treatment: 25 Verbal / Phone Orders: No Diagnosis Coding Follow-up Appointments o Return Appointment in 1 week. o Nurse Visit as needed [employer] o Wash wounds with antibacterial soap and water. o No tub bath. Anesthetic (Use 'Patient Medications' Section for Anesthetic Order Entry) o Lidocaine applied to wound bed Edema Control - Lymphedema / Segmental Compressive Device / Other o Elevate, Exercise Daily and Avoid Standing for Long Periods of Time. o Elevate legs to the level of the heart and pump ankles as often as possible o Elevate leg(s) parallel to the floor when sitting. Off-Loading o Total Contact Cast to Left  Lower Extremity - hold off on cast today per patient request will order Apligraf when next cast is applied contact cast #4 applied Don't get cast wet o Other: - keep pressure off of feet. Medications-Please add to medication list. o Keystone Compound - Keystone gel o Topical Antibiotic - AandD ointment to broken down area Wound Treatment Wound #11 - Foot Wound Laterality: Plantar, Right Cleanser: Soap and Water 1 x Per Day/30 Days Discharge Instructions: Gently cleanse wound with antibacterial soap, rinse and pat dry prior to dressing wounds Topical: keystone gel 1 x Per Day/30 Days Primary Dressing: Aquacel Extra Hydrofiber Dressing, 2x2 (in/in) 1 x Per Day/30 Days Secondary Dressing: ABD Pad 5x9 (in/in) (Generic) 1 x Per Day/30 Days Discharge Instructions: Cover with ABD pad Secured With: Medipore Tape - 3M Medipore H Soft Cloth Surgical Tape, 2x2 (in/yd) (Generic) 1 x Per Day/30 Days Secured With: Kerlix Roll Sterile or Non-Sterile 6-ply 4.5x4 (yd/yd) (Generic) 1 x Per Day/30 Days Discharge Instructions: Apply Kerlix as directed Wound #12 - Foot Wound Laterality:  Plantar, Left, Medial Cleanser: Soap and Water 1 x Per Day/30 Days Discharge Instructions: Gently cleanse wound with antibacterial soap, rinse and pat dry prior to dressing wounds Topical: Vitamin AandD Ointment, 4(oz) tube 1 x Per Day/30 Days Discharge Instructions: apply to red, broken down area Topical: keystone gel 1 x Per Day/30 Days Discharge Instructions: apply very thin layer to wound Primary Dressing: Hydrofera Blue Ready Transfer Foam, 2.5x2.5 (in/in) 1 x Per Day/30 Days PAMELA, INTRIERI A. ([id number]) Discharge Instructions: Apply Hydrofera Blue Ready to wound bed as directed Secondary Dressing: ABD Pad 5x9 (in/in) (Generic) 1 x Per Day/30 Days Discharge Instructions: Cover with ABD pad Secured With: Medipore Tape - 45M Medipore H Soft Cloth Surgical Tape, 2x2 (in/yd) (Generic) 1 x Per Day/30 Days Secured  With: Kerlix Roll Sterile or Non-Sterile 6-ply 4.5x4 (yd/yd) (Generic) 1 x Per Day/30 Days Discharge Instructions: Apply Kerlix as directed Electronic Signature(s) Signed: 07/06/2022 10:57:22 AM By: Kalman Shan DO Entered By: Kalman Shan on 07/06/2022 10:56:53 Scheeler, Sangita A. ([id number]) -------------------------------------------------------------------------------- Problem List Details Patient Name: Heather Dear A. Date of Service: 07/06/2022 9:45 AM Medical Record Number: [id number] Patient Account Number: [id number] Date of Birth/Sex: Jul 18, 1976 (46 y.o. 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 03/16/2022 No Yes exposed E11.621 Type 2 diabetes mellitus with foot ulcer 03/16/2022 No Yes E11.42 Type 2 diabetes mellitus with diabetic polyneuropathy 03/16/2022 No Yes L97.811 Non-pressure chronic ulcer of other part of right lower leg limited to 03/16/2022 No Yes breakdown of skin Inactive Problems Resolved Problems Electronic Signature(s) Signed: 07/06/2022 10:57:22 AM By: Kalman Shan DO Entered By: Kalman Shan on 07/06/2022 10:50:23 [name] ([id number]) -------------------------------------------------------------------------------- Progress Note Details Patient Name: Heather Dear A. Date of Service: 07/06/2022 9:45 AM Medical Record Number: [id number] Patient Account Number: [id number] Date of Birth/Sex: Jun 20, 1976 (46 y.o. F) Treating RN: Cornell Barman Primary Care Provider: Raelene Bott Other Clinician: Massie Kluver Referring  Provider: Raelene Bott Treating Provider/Extender: Yaakov Guthrie in Treatment: 16 Subjective Chief Complaint Information obtained from Patient 03/19/2021; patient referred by Dr. Luana Shu who has been looking after her left foot for quite a period of time for review of a nonhealing area in the left midfoot 03/12/2022; bilateral feet wounds and right lower extremity wound. History of Present Illness (HPI) 01/18/18-She is here for initial evaluation of the left great toe ulcer. She is a poor historian in regards to timeframe in detail. She states approximately 4 weeks ago she lacerated her toe on something in the house. She followed up with her primary care who placed her on Bactrim and ultimately a second dose of Bactrim prior to coming to wound clinic. She states she has been treating the toe with peroxide, Betadine and a Band-Aid. She did not check her blood sugar this morning but checked it yesterday morning it was 327; she is unaware of a recent A1c and there are no current records. She saw Dr. she would've orthopedics last week for an old injury to the left ankle, she states he did not see her toe, nor did she bring it to his attention. She smokes approximately 1 pack cigarettes a day. Her social situation is concerning, she arrives  this morning with her mother who appears extremely intoxicated/under the influence; her mother was asked to leave the room and be monitored by the patient's grandmother. The patient's aunt then accompanied the patient and the room throughout the rest of the appointment. We had a lengthy discussion regarding the deleterious effects of uncontrolled hyperglycemia and smoking as it relates to wound healing and overall health. She was strongly encouraged to decrease her smoking and get her diabetes under better control. She states she is currently on a diet and has cut down her [facility] consumption. The left toe is erythematous, macerated and slightly edematous  with malodor present. The edema in her left foot is below her baseline, there is no erythema streaking. We will treat her with Santyl, doxycycline; we have ordered and xray, culture and provided a Peg assist surgical shoe and cultured the wound. 01/25/18-She is here in follow-up evaluation for a left great toe ulcer and presents with an abscess to her suprapubic area. She states her blood sugars remain elevated, feeling "sick" and if levels are below 250, but she is trying. She has made no attempt to decrease her smoking stating that we "can't take away her food in her cigarettes". She has been compliant with offloading using the PEG assist you. She is using Santyl daily. the culture obtained last week grew staph aureus and Enterococcus faecalis; continues on the doxycycline and Augmentin was added on Monday. The suprapubic area has erythema, no femoral variation, purple discoloration, minimal induration, was accessed with a cotton tip applicator with sanguinopurulent drainage, this was cultured, I suspect the current antibiotic treatment will cover and we will not add anything to her current treatment plan. She was advised to go to urgent care or ER with any change in redness, induration or fever. 02/01/18-She is here in follow-up evaluation for left great toe ulcers and a new abdominal abscess from last week. She was able to use packing until earlier this week, where she "forgot it was there". She states she was feeling ill with GI symptoms last week and was not taking her antibiotic. She states her glucose levels have been predominantly less than 200, with occasional levels between 200-250. She thinks this was contributing to her GI symptoms as they have resolved without intervention. There continues to be significant laceration to left toe, otherwise it clinically looks stable/improved. There is now less superficial opening to the lateral aspect of the great toe that was residual blister. We  will transition to [facility] to all wounds, she will continue her Augmentin. If there is no change or deterioration next week for reculture. 02/08/18-She is here in follow-up evaluation for left great toe ulcer and abdominal ulcer. There is an improvement in both wounds. She has been wrapping her left toe with coban, not by our direction, which has created an area of discoloration to the medial aspect; she has been advised to NOT use coban secondary to her neuropathy. She states her glucose levels have been high over this last week ranging from 200-350, she continues to smoke. She admits to being less compliant with her offloading shoe. We will continue with same treatment plan and she will follow-up next week. 02/15/18-She is here in follow-up evaluation for left great toe ulcer and abdominal ulcer. The abdominal ulcer is epithelialized. The left great toe ulcer is improved and all injury from last week using the Coban wrap is resolved, the lateral ulcer is healed. She admits to noncompliance with wearing offloading shoe and admits  to glucose levels being greater than 300 most of the week. She continues to smoke and expresses no desire to quit. There is one area medially that probes deeper than it has historically, erythema to the toe and dorsal foot has consistently waxed and waned. There is no overt signs of cellulitis or infection but we will culture the wound for any occult infection given the new area of depth and erythema. We will hold off on sensitivities for initiation of antibiotic therapy. 02/22/18-She is here in follow up evaluation for left great toe ulcer. There is overall significant improvement in both wound appearance, erythema and edema with changes made last week. She was not initiated on antibiotic therapy. Culture obtained last week showed oxacillin sensitive staph aureus, sensitive to clindamycin. Clindamycin has been called into the pharmacy but she has been instructed to hold  off on initiation secondary to overall clinical improvement and her history of antibiotic intolerance. She has been instructed to contact the clinic with any noted changes/deterioration and the wound, erythema, edema and/or pain. She will follow-up next week. She continues to smoke and her glucose levels remain elevated >250; she admits to compliance with offloading shoe 03/01/18 on evaluation today patient appears to be doing fairly well in regard to her left first toe ulcer. She has been tolerating the dressing changes with the [facility] Dressing without complication and overall this has definitely showed signs of improvement according to records as well is what the patient tells me today. I'm very pleased in that regard. She is having no pain today 03/08/18 She is here for follow up evaluation of a left great toe ulcer. She remains non-compliant with glucose control and smoking cessation; glucose levels consistently >200. She states that she got new shoe inserts/peg assist. She admits to compliance with offloading. Since my last evaluation there is significant improvement. We will switch to prisma at this time and she will follow up next week. She is noted to be tachycardic at this appointment, heart rate 120s; she has a history of heart rate 70-130 according to our records. She admits to extreme agitation r/t personal issues; she was advised to monitor her heartrate and contact her physician if it does not return to a more normal range (<100). She takes cardizem twice daily. 03/15/18-She is here in follow-up evaluation for left great toe ulcer. She remains noncompliant with glucose control and smoking cessation. She admits to compliance with wearing offloading shoe. The ulcer is improved/stable and we will continue with the same treatment plan and she will follow-up next week 03/22/18-She is here for evaluation for left great toe ulcer. There continues to be significant improvement despite  recurrent hyperglycemia (over 500 yesterday) and she continues to smoke. She has been compliant with offloading and we will continue with same treatment plan and she will follow-up next week. [name], [name] ([id number]) 03/29/18-She is here for evaluation for left great toe ulcer. Despite continuing to smoke and uncontrolled diabetes she continues to improve. She is compliant with offloading shoe. We will continue with the same treatment plan and she will follow-up next week 04/05/18- She is here in follow up evaluation for a left great toe ulcer; she presents with small pustule to left fifth toe (resembles ant bite). She admits to compliance with wearing offloading shoe; continues to smoke or have uncontrolled blood glucose control. There is more callus than usual with evidence of bleeding; she denies known trauma. 04/12/18-She is here for evaluation of left great toe ulcer. Despite  noncompliance with glycemic control and smoking she continues to make improvement. She continues to wear offloading shoe. The pustule, that was identified last week, to the left fifth toe is resolved. She will follow-up in 2 weeks 05/03/18-she is seen in follow-up evaluation for a left great toe ulcer. She is compliant with offloading, otherwise noncompliant with glycemic control and smoking. She has plateaued and there is minimal improvement noted. We will transition to [facility], replaced the insert to her surgical shoe and she will follow-up in one week 05/10/18- She is here in follow up evaluation for a left great toe ulcer. It appears stable despite measurement change. We will continue with same treatment plan and follow up next week. 05/24/18-She is seen in follow-up evaluation for a left great toe ulcer. She remains compliant with offloading, has made significant improvement in her diet, decreasing the amount of sugar/soda. She said her recent A1c was 10.9 which is lower than. She did see a diabetic  nutritionist/educator yesterday. She continues to smoke. We will continue with the same treatment plan and she'll follow-up next week. 05/31/18- She is seen in follow-up evaluation for left great toe ulcer. She continues to remain compliant with offloading, continues to make improvement in her diet, increasing her water and decreasing the amount of sugar/soda. She does continue to smoke with no desire to quit. We will apply Prisma to the depth and Hydrofera Blue over. We have not received insurance authorization for oasis. She will follow up next week. 06/07/18-She is seen in follow-up evaluation for left great toe ulcer. It has stalled according to today's measurements although base appears stable. She says she saw a diabetic educator yesterday; her average blood sugars are less than 300 which is an improvement for her. She continues to smoke and states "that's my next step" She continues with water over soda. We will order for xray, culture and reinstate ace wrap compression prior to placing apligraf for next week. She is voicing no complaints or concerns. Her dressing will change to iodoflex over the next week in preparation for apligraf. 06/14/18-She is seen in follow-up evaluation for left great toe ulcer. Plain film x-ray performed last week was negative for osteomyelitis. Wound culture obtained last week grew strep B and OSSA; she is initiated on keflex and cefdinir today; there is erythema to the toe which could be from ace wrap compression, she has a history of wrapping too tight and has has been encouraged to maintain ace wraps that we place today. We will hold off on application of apligraf today, will apply next week after antibiotic therapy has been initiated. She admits today that she has resumed taking a shower with her foot/toe submerged in water, she has been reminded to keep foot/toe out of the bath water. She will be seen in follow up next week 06/21/18-she is seen in follow-up  evaluation for left great toe ulcer. She is tolerating antibiotic therapy with no GI disturbance. The wound is stable. Apligraf was applied today. She has been decreasing her smoking, only had 4 cigarettes yesterday and 1 today. She continues being more compliant in diabetic diet. She will follow-up next week for evaluation of site, if stable will remove at 2 weeks. 06/28/18- She is here in follow up evalution. Apligraf was placed last week, she states the dressing fell off on Tuesday and she was dressing with hydrofera blue. She is healed and will be discharged from the clinic today. She has been instructed to continue with smoking cessation,  continue monitoring glucose levels, offloading for an additional 4 weeks and continue with hydrofera blue for additional two weeks for any possible microscopic opening. Readmission: 08/07/18 on evaluation today patient presents for reevaluation concerning the ulcer of her right great toe. She was previously discharged on 06/28/18 healed. Nonetheless she states that this began to show signs of drainage she subsequently went to her primary care provider. Subsequently an x-ray was performed on 08/01/18 which was negative. The patient was also placed on antibiotics at that time. Fortunately they should have been effective for the infection. Nonetheless she's been experiencing some improvement but still has a lot of drainage coming from the wound itself. 08/14/18 on evaluation today patient's wound actually does show signs of improvement in regard to the erythema at this point. She has completed the antibiotics. With that being said we did discuss the possibility of placing her in a total contact cast as of today although I think that I may want to give this just a little bit more time to ensure nothing recurrence as far as her infection is concerned. I do not want to put in the cast and risk infection at that time if things are not completely resolved. With that being  said she is gonna require some debridement today. 08/21/18 on evaluation today patient actually appears to be doing okay in regard to her toe ulcer. She's been tolerating the dressing changes without complication. With that being said it does appear that she is ready and in fact I think it's appropriate for [place] to go ahead and initiate the total contact cast today. Nonetheless she will require some sharp debridement to prepare the wound for application. Overall I feel like things have been progressing well but we do need to do something to get this to close more readily. 08/24/18 patient seen today for reevaluation after having had the total contact cast applied on Tuesday. She seems to have done very well the wound appears to be doing great and overall I'm pleased with the progress that she's made. There were no abnormal areas of rubbing from the cast on her lower extremity. 08/30/18 on evaluation today patient actually appears to be completely healed in regard to her plantar toe ulcer. She tells me at this point she's been having a lot of issues with the cast. She almost fell a couple of times the state shall the step of her dog a couple times as well. This is been a very frustrating process for her other nonetheless she has completely healed the wound which is excellent news. Overall there does not appear to be the evidence of infection at this time which is great news. 09/11/18 evaluation today patient presents for follow-up concerning her great toe ulcer on the left which has unfortunately reopened since I last saw her which was only a couple of weeks ago. Unfortunately she was not able to get in to get the shoe and potentially the AFO that's gonna be necessary due to her left foot drop. She continues with offloading shoe but this is not enough to prevent her from reopening it appears. When we last had her in the total contact cast she did well from a healing standpoint but unfortunately the wound  reopened as soon as she came out of the cast within just a couple of weeks. 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 This is also part of the reason why she falls on a regular basis. I just do not believe that is gonna be safe for [place] to reinitiate the total contact cast as last time we had this on she fell 3 times one week which is definitely not normal for her. 09/18/18 upon evaluation today the patient actually appears to be doing about the same in regard to her toe ulcer. She did not contact [facility] as I asked her to even though I had given her the prescription. In fact she actually states that she has no idea where the prescription is. She did apparently call [facility] and they told her that all she needed to do was bring the prescription in order to be able to be seen and work on getting the AFO for her left foot. With all that being said she still does not have an appointment and I'm not sure were things stand that regard. I will give her a new prescription today in order to contact them to get this set up. [name], [name] ([id number]) 09/25/18 on evaluation today patient actually appears to be doing about the same in regard to her toes ulcer. She does have a small areas which seems to have a lot of callous buildup around the edge of the wound which is going to need sharp debridement today. She still is waiting to be scheduled for evaluation with [facility] for possibility of an AFO. She states there supposed to call her tomorrow to get this set up. Unfortunately it does appear that her foot specifically the toe area is showing signs of erythema. There does not appear to be any systemic infection which is in these good news. 10/02/18 on evaluation today patient actually appears to be doing about the  same in regard to her toe ulcer. This really has not done too well although it's not significantly larger it's also not significantly smaller. She has been tolerating the dressing changes without complication. She actually has her appointment with [facility] and [facility] tomorrow to hopefully be measured for obtaining and AFO splint. I think this would be helpful preventing this from reoccurring. We had contemplated starting the cast this week although to be honest I am reluctant to do that as she's been having nausea, vomiting, and seizure activity over the past three days. She has a history of seizures and have been told is nothing that can be done for these. With that being said I do believe that along with the seizures have the nausea vomiting which upon further questioning doesn't seem to be the normal for her and makes me concerned for the possibility of infection or something else going on. I discussed this with the patient and her mother during the office visit today. I do not feel the wound is effective but maybe something else. The responses this was "this just happens to her at times and we don't know why". They did not seem to be interested in going to the hospital to have this checked out further. 10/09/18 on evaluation today patient presents for follow-up concerning her ongoing toe ulcer. She has been tolerating the dressing changes without complication. Fortunately there does not appear to be any evidence of infection which is great news however I do think that the patient would benefit from going ahead for with the total contact cast. She's actually in a wheelchair today she tells me that  she will use her walker if we initiate the cast. I was very specific about the fact that if we were gonna do the cast I wanted to make sure that she was using the walker in order to prevent any falls. She tells me she does not have stairs that she has to traverse on a regular basis at her home. She has  not had any seizures since last week again that something that happens to her often she tells me she did talk to [employer] and they said that it may take up to three weeks to get the brace approved for her. Hopefully that will not take that long but nonetheless in the meantime I do think the cast could be of benefit. 10/12/18 on evaluation today patient appears to be doing rather well in regard to her toe ulcer. It's just been a few days and already this is significantly improved both as far as overall appearance and size. Fortunately there's no sign of infection. She is here for her first obligatory cast change. 10/19/18 Seen today for follow up and management of left great toe ulcer. Wound continues to show improvement. Noted small open area with seroussang drainage with palpation. Denies any increased pain or recent fevers during visit. She will continue calcium alginate with offloading shoe. Denies any questions or concerns during visit. 10/26/18 on evaluation today patient appears to be doing about the same as when I last saw her in regard to her wound bed. Fortunately there does not appear to be any signs of infection. Unfortunately she continues to have a breakdown in regard to the toe region any time that she is not in the cast. It takes almost no time at all for this to happen. Nonetheless she still has not heard anything from the brace being made by [employer] as to when exactly this will be available to her. Fortunately there is no signs of infection at this time. 10/30/18 on evaluation today patient presents for application of the total contact cast as we just received him this morning. Fortunately we are gonna be able to apply this to her today which is great news. She continues to have no significant pain which is good news. Overall I do feel like things have been improving while she was the cast is when she doesn't have a cast that things get worse. She still has not really heard anything from  [place] regarding her brace. 11/02/18 upon evaluation today patient's wound already appears to be doing significantly better which is good news. Fortunately there does not appear to be any signs of infection also good news. Overall I do think the total contact cast as before is helping to heal this area unfortunately it's just not gonna likely keep the area closed and healed without her getting her brace at least. Again the foot drop is a significant issue for her. 11/09/18 on evaluation today patient appears to be doing excellent in regard to her toe ulcer which in fact is completely healed. Fortunately we finally got the situation squared away with the paperwork which was needed to proceed with getting her brace approved by Medicaid. I have filled that out unfortunately that information has been sent to the orthopedic office that I worked at 2 1/2 years ago and not tired Current wound care measures. Fortunately she seems to be doing very well at this time. 11/23/18 on evaluation today patient appears to be doing More Poorly Compared to Last Time I Saw Her. At That [facility]  She Had Completely Healed. Currently she is continuing to have issues with reopening. She states that she just found out that the brace was approved through Medicaid now she just has to go get measured in order to have this fitted for her and then made. Subsequently she does not have an appointment for this yet that is going to complicate things we obviously cannot put her back in the cast if we do not have everything measured because they're not gonna be able to measure her foot while she is in the cast. 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 Obviously this is unfortunate and does have me somewhat worried as well. 11/30/18 on evaluation today patient's toe ulcer actually appears to be doing fairly well. The good news is she will be  getting her brace in the shoes next week on Wednesday. Hopefully we will be able to get this to heal without having to go back in the cast however she may need the cast in order to get the wound completely heal and then go from there. Fortunately there's no signs of infection at this time. 12/07/18 on evaluation today patient fortunately did receive her brace and she states she could tell this definitely makes her walk better. With that being said she's been having issues with her toe where she noticed yesterday there was a lot of tissue that was loosing off this appears to be much larger than what it was previous. She also states that her leg has been read putting much across the top of her foot just about the ankle although this seems to be receiving somewhat. The total area is still red and appears to be someone infected as best I can tell. She is previously taken Bactrim and that may be a good option for her today as well. We are gonna see what I wound culture shows as well and I think that this is definitely appropriate. With that being said outside of the culture I still need to initiate something in the interim and that's what I'm gonna go ahead and select Bactrim is a good option for her. 12/14/18 on evaluation today patient appears to be doing better in regard to her left great toe ulcer as compared to last week's evaluation. There's still some erythema although this is significantly improved which is excellent news. Overall I do believe that she is making good progress is still gonna take some time before she is where I would like her to be from the standpoint of being able to place her back into the total contact cast. Hopefully we will be where we need to be by next week. 12/21/18 on evaluation today patient actually appears to be doing poorly in regard to her toe ulcer. She's been tolerating the dressing changes without complication. Fortunately there's no signs of systemic infection  although she does have a lot of drainage from the toe ulcer and this does Siglin, Modelle A. ([id number]) seem to be causing some issues at this point. She does have erythema on the distal portion of her toe that appears to be likely cellulitis. 12/28/18 on evaluation today patient actually appears to be doing a little better in my pinion in regard to her toe ulcer. With that being said she still does have some evidence of infection at this time and for her culture she had both E. coli as well as  enterococcus as organisms noted on evaluation. For that reason I think that though the Keflex likely has treated the E. coli rather well this has really done nothing for the enterococcus. We are going to have to initiate treatment for this specifically. 01/04/19 on evaluation today patient's toe actually appears to be doing better from the standpoint of infection. She currently would like to see about putting the cash back on I think that this is appropriate as long as she takes care of it and keeps it from getting wet. She is gonna have some drainage we can definitely pass this up with Drawtex and alginate to try to prevent as much drainage as possible from causing the problems. With that being said I do want to at least try her with the cast between now and Tuesday. If there any issues we can't continue to use it then I will discontinue the use of the cast at that point. 01/08/19 on evaluation today patient actually appears to be doing very well as far as her foot ulcer specifically the great toe on the left is concerned. She did have an area of rubbing on the medial aspect of her left ankle which again is from the cast. Fortunately there's no signs of infection at this point in this appears to be a very slight skin breakdown. The patient tells me she felt it rubbing but didn't think it was that bad. Fortunately there is no signs of active infection at this time which is good news. No fevers, chills, nausea, or  vomiting noted at this time. 01/15/19 on evaluation today patient actually appears to be doing well in regard to her toe ulcer. Again as previous she seems to do well and she has the cast on which indicates to me that during the time she doesn't have a cast on she's putting way too much pressure on this region. Obviously I think that's gonna be an issue as with the current national emergency concerning the Covid-19 Virus it has been recommended that we discontinue the use of total contact casting by the chief medical officer of our company, Dr. Simona Huh. The reasoning is that if a patient becomes sick and cannot come into have the cast removed they could not just leave this on for an additional two weeks. Obviously the hospitals also do not want to receive patient's who are sick into the emergency department to potentially contaminate the region and spread the Covid-19 Virus among other sick individuals within the hospital system. Therefore at this point we are suspending the use of total contact cast until the current emergency subsides. This was all discussed with the patient today as well. 01/22/19 on evaluation today patient's wound on her left great toe appears to be doing slightly worse than previously noted last week. She tells me that she has been on this quite a bit in fact she tells me she's been awake for 38 straight hours. This is due to the fact that she's having to care for grandparents because nobody else will. She has been taking care of them for five the last seven days since I've seen her they both have dementia his is from a stroke and her grandmother's was progressive. Nonetheless she states even her mom who knows her condition and situation has only help two of those days to take care of them she's been taking care of the rest. Fortunately there does not appear to be any signs of active infection in regard to her toe at this  point although obviously it doesn't look as good as it did  previous. I think this is directly related to her not taking off the pressure and friction by way of taking things easy. Though I completely understand what's going on. 01/29/19 on evaluation today patient's tools are actually appears to be showing some signs of improvement today compared to last week's evaluation as far as not necessarily the overall size of the wound but the fact that she has some new skin growth in between the two ends of the wound opening. Overall I feel like she has done well she states that she had a family member give her what sounds to be a CAM walker boot which has been helpful as well. 02/05/19 on evaluation today patient's wound bed actually appears to be doing significantly better in regard to her overall appearance of the size of the wound. With that being said she is still having an issue with offloading efficiently enough to get this to close. Apparently there is some signs of infection at this point as well unfortunately. Previously she's done well of Augmentin I really do not see anything that needs to be culture currently but there theme and cellulitis of the foot that I'm seeing I'm gonna go ahead and place her on an antibiotic today to try to help clear this up. 02/12/2019 on evaluation today patient actually appears to be doing poorly in regard to her overall wound status. She tells me she has been using her offloading shoe but actually comes in today wearing her tennis shoe with the AFO brace. Again as I previously discussed with her this is really not sufficient to allow the area to heal appropriately. Nonetheless she continues to be somewhat noncompliant and I do wonder based on what she has told my nurse in the past as to whether or not a good portion of this noncompliance may be recreational drug and alcohol related. She has had a history of heroin overdose and this was fairly recently in the past couple of months that have been seeing her. Nonetheless overall I  feel like her wound looks significantly worse today compared to what it was previous. She still has significant erythema despite the Augmentin I am not sure that this is an appropriate medication for her infection I am also concerned that the infection may have gone down into her bone. 02/19/19 on evaluation today patient actually appears to be doing about the same in regard to her toe ulcer. Unfortunately she continues to show signs of bone exposure and infection at this point. There does not appear to be any evidence of worsening of the infection but I'm also not really sure that it's getting significantly better. She is on the Augmentin which should be sufficient for the Staphylococcus aureus infection that she has at this point. With that being said she may need IV antibiotics to more appropriately treat this. We did have a discussion today about hyperbaric option therapy. 02/28/19 on evaluation today patient actually appears to be doing much worse in regard to the wound on her left great toe as compared to even my previous evaluation last week. Unfortunately this seems to be training in a pretty poor direction. Her toe was actually now starting to angle laterally and I can actually see the entire joint area of the proximal portion of the digit where is the distal portion of the digit again is no longer even in contact with the joint line. Unfortunately there's a lot more necrotic  tissue around the edge and the toe appears to be showing signs of becoming gangrenous in my pinion. I'm very concerned about were things stand at this point. She did see infectious disease and they are planning to send in a prescription for Sivextro for her and apparently this has been approved. With that being said I don't think she should avoid taking this but at the same time I'm not sure that it's gonna be sufficient to save her toe at this point. She tells me that she still having to care for grandparents which I think  is putting quite a bit of strain on her foot and specifically the total area and has caused this to break down even to a greater degree than would've otherwise been expected. 03/05/19 on evaluation today patient actually appears to be doing quite well in regard to her toe all things considering. She still has bone exposed but there appears to be much less your thing on overall the appearance of the wound and the toe itself is dramatically improved. She still does have some issues currently obviously with infection she did see vascular as well and there concerned that her blood flow to the toad. For that reason they are setting up for an angiogram next week. 03/14/19 on evaluation today patient appears to be doing very poor in regard to her toe and specifically in regard to the ulceration and the fact that she's starting to notice the toe was leaning even more towards the lateral aspect and the complete joint is visible on the proximal aspect of the joint. Nonetheless she's also noted a significant odor and the tip of the toe is turning more dark and necrotic appearing. Overall I think she is getting worse not better as far as this is concerned. For that reason I am recommending at this point that she likely needs to be seen for likely amputation. [name], [name] ([id number]) READMISSION 03/19/2021 This is a patient that we cared for in this clinic for a prolonged period of time in 2019 and 2020 with a left foot and left first toe wound. 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 Luana Shu. In December 2021 she had an ulcer on her right great toe as well as the fourth and fifth toes. 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 She also had an angiogram at that time and underwent angioplasty of the right anterior tibial artery. In any case she claims that  the wound on the right foot is closed I did not look at this today which was probably an oversight although I think that should be done next week. After her surgery she developed a dehiscence but I do not see any follow-up of this. According to Dr. Deborra Medina last review that she was out of the area being cared for by another physician but recently came back to his attention. 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 A culture of this area showed E. coli apparently before she came back to see Dr. Luana Shu she was supposed to be receiving antibiotics but she did not really take them. Nor is she offloading this area at all. Finally her last hemoglobin A1c listed in epic was in March 2022 at 14.1 she says things are a lot better since then although I am not sure. 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 She had this wound at the time. During this admission she had E. coli sepsis a TEE was negative. 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 She is still a heavy cigarette smoker. She has a history of bipolar disorder seizure disorder ABI in our clinic was 1.05 6/1; left midfoot in the setting of a TMA done previously. Round circular wound with a "knuckle" of protruding tissue. The problem is that the knuckle was not attached to any of the surrounding granulation and this probed proximally widely I removed a large portion of this tissue. This wound goes with considerable undermining laterally. I do not feel any bone there was no purulence but this is a deep wound. 6/8; in spite of the debridement I did last week. She arrives with a wound looking exactly the  same. A protruding "knuckle" of tissue nonadherent to most of the surrounding tissue. There is considerable depth around this from 6-12 o'clock at 2.7 cm and undermining of 1 cm. This does not look overtly infected and the x-ray I did last week was negative for any osseous abnormalities. We have been using silver collagen 6/15; deep tissue culture I did last week showed moderate staph aureus and moderate Pseudomonas. This will definitely require prolonged antibiotic therapy. The pathology on the protuberant area was negative for malignancy fungus etc. 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 We have been using silver collagen. I am going to be prescribing Levaquin for 2 weeks. Her CT scan of the foot is down for 7/5 6/22; CT scan of the foot on 7 5. She says she has hardware in the left leg from her previous fracture. She is on the Levaquin for the deep tissue culture I did that showed methicillin sensitive staph aureus and Pseudomonas. I gave her a 2-week supply and she will have another week. She arrives in clinic today with the same protuberant tissue however this is nonadherent to the tissue surrounding it. I am really at a loss to explain this unless there is underlying deep tissue infection 6/29; patient presents for 1 week follow-up. She has been using collagen to the wound bed. She reports taking her antibiotics as prescribed.She has no complaints or issues today. She denies signs of infection. 7/6; patient presents for one week followup. She has been using collagen to the wound bed. She states she is taking Levaquin however at times she is not able to keep it down. She denies signs of infection. 7/13; patient presents for 1 week follow-up. She has been using silver alginate to the wound bed. She still has nausea when taking her antibiotics. She denies signs of infection. 7/20; patient presents for 1 week follow-up. She has been  using silver alginate with gentamicin cream to the wound bed. She denies any issues and has no complaints today. She denies signs of infection. 7/27; patient presents for 1 week follow-up. She continues to use silver alginate with gentamicin cream to the wound bed. She reports starting her antibiotics. She has no issues or complaints. Overall she reports stability to the wound. 8/3; patient presents for 1 week follow-up. She has been using silver alginate with gentamicin cream to the wound bed.  She reports completing all antibiotics. She has no issues or complaints today. She denies signs of infection. 8/17; patient presents for 2-week follow-up. He is to use silver alginate to the wound bed. She has no issues or complaints today. She denies signs of infection. She reports her pain has improved in her foot since last clinic visit 8/24; patient presents for 1 week follow-up. She continues to use silver alginate to the wound bed. She has no issues or complaints. She denies signs of infection. Pain is stable. 9/7; patient presents for follow-up. She missed her last week appointment due to feeling ill. She continues to use silver alginate. She has a new wound to the right lower extremity that is covered in eschar. She states It occurred over the past week and has no idea how it started. She currently denies signs of infection. 9/14; patient presents for follow-up. To the left foot wound she has been using gentamicin cream and silver alginate. To the right lower extremity wound she has been keeping this covered and has not obtain Santyl. 9/21; patient presents for follow-up. She reports using gentamicin cream and silver alginate to the left foot and Santyl to the right lower extremity wound. She has no issues or complaints today. She denies signs of infection. 9/28; patient presents for follow-up. She reports a new wound to her right heel. She states this occurred a few days ago and is  progressively gotten worse. She has been trying to clean the area with a Q-tip and Santyl. She reports stability in the other 2 wounds. She has been using gentamicin cream and silver alginate to the left foot and Santyl to the right lower extremity wound. 10/12; patient presents for follow-up. She reports improvement to the wound beds. She is seeing vein and vascular to discuss the potential of a left BKA. She states they are going to do an arteriogram. She continues to use silver alginate with dressing changes to her wounds. 11/2; patient presents for follow-up. She states she has not been doing dressing changes to the wound beds. She states she is not able to offload the areas. She reports chronic pain to her left foot wound. 11/9; patient presents for follow-up. She came in with only socks on. She states she forgot to put on shoes. It is unclear if she is doing any dressing changes. She currently denies systemic signs of infection. 11/16; patient presents for follow-up. She came again only with socks on. She states she does not wear shoes ever. It is unclear if she does dressing changes. She currently denies systemic signs of infection. 11/23; patient presents for follow-up. She wore her shoes today. It still unclear exactly what dressing she is using for each wound but she did states she obtained Dakin's solution and has been using this to the left foot wound. She currently denies signs of infection. SALIMA, RUMER ([id number]) 11/30; patient presents for follow-up. She has no issues or complaints today. She currently denies signs of infection. 12/7; patient presents for follow-up. She has no issues or complaints today. She has been using Hydrofera Blue to the right heel wound and Dakin solution to the left foot wound. Her right anterior leg wound is healed. She currently denies signs of infection. 12/14; patient presents for follow-up. She has been using Hydrofera Blue to the right heel and  Dakin's to the left foot wounds. She has no issues or complaints today. She denies signs of infection. 12/21; patient presents for follow-up. She reports using  Hydrofera Blue to the right heel and Dakin's to the left foot wound. She denies signs of infection. 12/28; patient presents for follow-up. She continues to use Dakin's to the left foot wound and Hydrofera Blue to the right heel wound. She denies signs of infection. 1/4; patient presents for follow-up. She has no issues or complaints today. She denies signs of infection. 1/11; patient presents for follow-up. It is unclear if she has been dressing these wounds over the past week. She currently denies signs of infection. 1/18; patient presents for follow-up. She states she has been using Dakin's wet-to-dry dressings to the left foot. She has been using Hydrofera Blue to the right foot foot wound. She states that the anterior right leg wound has reopened and draining serous fluid. She denies signs of infection. 1/25; patient presents for follow-up. She has no issues or complaints today. 2/1; patient presents for follow-up. She has no issues or complaints today. She denies signs of infection. 2/8; patient presents for follow-up. She has lost her surgical shoes. She did not have a dressing to the right heel wound. She currently denies signs of infection. 2/15; patient presents for follow-up. She reports more pain to the right heel today. She denies purulent drainage Or fever/chills 2/22; patient presents for follow-up. She reports taking clindamycin over the past week. She states that she continues to have pain to her right heel. She reports purulent drainage. Readmission 03/16/2022 Ms. Monifah Freehling is a 46 year old female with a past medical history of type 2 diabetes, osteomyelitis to her feet, chronic systolic heart failure and bipolar disorder that presents to the clinic for bilateral feet wounds and right lower extremity wound. She was  last seen in our clinic on 12/15/2021. At that time she had purulent drainage coming out of her right plantar foot and I recommended she go to the ED. She states she went to [facility] and has been there for the past 3 months. I cannot see the records. She states she had OR debridement and was on several weeks of IV antibiotics while inpatient. Since discharge she has not been taking care of the wound beds. She had nothing on her feet other than socks today. She currently denies signs of infection. 5/31; patient presents for follow-up. She has been using Dakin's wet-to-dry dressings to the wound beds on her feet bilaterally and antibiotic ointment to the right anterior leg wound. She had a wound culture done at last clinic visit that showed moderate Pseudomonas aeruginosa sensitive to ciprofloxacin. She currently denies systemic signs of infection. 6/14; patient presents for follow-up. She received Keystone 5 days ago and has been using this on the wound beds. She states that last week she had to go to the hospital because she had increased warmth and erythema to the right foot. She was started on 2 oral antibiotics. She states she has been taking these. She currently denies systemic signs of infection. She has no issues or complaints today. 6/21; patient presents for follow-up. She states she has been using Keystone antibiotics to the wound beds. She has no issues or complaints today. She denies signs of infection. 6/28; patient presents for follow-up. She has been using Keystone antibiotics to the wound beds. She has no issues or complaints today. 7/12; patient presents for follow-up. Has been using Keystone antibiotics to the wound beds with calcium alginate. She has no issues or complaints today. She never followed up with her orthopedic surgeon who did the OR debridement to the right  foot. We discussed the total contact cast for the left foot and patient would like to do this next  week. 7/19; patient presents for follow-up. She has been using Keystone antibiotics with calcium alginate to the wound beds. She has no issues or complaints today. Patient is in agreement to do the total contact cast of the left foot today. She knows to return later this week for the obligatory cast change. 05-13-2022 upon evaluation today patient's wound which she has the cast of the left leg actually appears to be doing significantly better. Fortunately I do not see any signs of active infection locally or systemically which is great news and overall I am extremely pleased with where we stand currently. 7/26; patient presents for follow-up. She has a cast in place for the past week. She states it irritated her shin. Other than that she tolerated the cast well. She states she would like a break for 1 week from the cast. We have been using Keystone antibiotic and Aquacel to both wound beds. She denies signs of infection. 8/2; patient presents for follow-up. She has been using Keystone and Aquacel to the wound beds. She denies any issues and has no complaints. She is agreeable to have the cast placed today for the left leg. 06-03-2022 upon evaluation today patient appears to be doing well with regard to her wound she saw some signs of improvement which is great news. Fortunately I do not see any evidence of active infection locally or systemically at this time which is great news. No fevers, chills, nausea, vomiting, or diarrhea. JENNESIS, RAMASWAMY A. ([id number]) 8/16; patient presents for follow-up. She has no issues or complaints today. We have been using Keystone and Aquacel to the wound beds. The left lower extremity is in a total contact cast. She is tolerated this well. 8/23; patient presents for follow-up. She has had the total contact cast on the left leg for the past week. Unfortunately this has rubbed and broken down the skin to the medial foot. She currently denies signs of infection. She has  been using Keystone antibiotic to the right plantar foot wound. 8/30; patient presents for follow-up. We have held off on the total contact cast for the left leg for the past week. Her wound on the left foot has improved and the previous surrounding breakdown of skin has epithelialized. She has been using Keystone antibiotic to both wound beds. She has no issues or complaints today. She denies signs of infection. 9/6; patient presents for follow-up. She has ordered her's Keystone antibiotic and this is arriving this week. She has been doing Dakin's wet-to- dry dressings to the wound beds. She denies signs of infection. She is agreeable to the total contact cast today. 9/13; patient presents for follow-up. She states that the cast caused her left leg shin to be sore. She would like to take a break from the cast this week. She has been using Keystone antibiotic to the right plantar foot wound. She denies signs of infection. Objective Constitutional Vitals Time Taken: 10:13 AM, Height: 69 in, Weight: 178 lbs, BMI: 26.3, Temperature: 98.3 F, Pulse: 71 bpm, Respiratory Rate: 16 breaths/min, Blood Pressure: 108/71 mmHg. General Notes: Right foot: To the plantar heel there is an incision site with increased depth. Nonviable tissue at the surface, callus present. No probing to bone. Left foot: To the medial aspect there is an open wound with granulation tissue and nonviable tissue with surrounding callus. Again no signs of infection. Integumentary (Hair, Skin)  Wound #11 status is Open. Original cause of wound was Surgical Injury. The date acquired was: 12/01/2021. The wound has been in treatment 16 weeks. The wound is located on the [place]. The wound measures 0.6cm length x 0.3cm width x 0.6cm depth; 0.141cm^2 area and 0.085cm^3 volume. There is Fat Layer (Subcutaneous Tissue) exposed. There is a medium amount of serosanguineous drainage noted. The wound margin is distinct with the outline  attached to the wound base. There is medium (34-66%) pink, pale granulation within the wound bed. There is a medium (34-66%) amount of necrotic tissue within the wound bed including Adherent Slough. Wound #12 status is Open. Original cause of wound was Pressure Injury. The date acquired was: 03/16/2020. The wound has been in treatment 16 weeks. The wound is located on the [place]. The wound measures 2.5cm length x 1.1cm width x 0.1cm depth; 2.16cm^2 area and 0.216cm^3 volume. There is Fat Layer (Subcutaneous Tissue) exposed. There is a medium amount of serous drainage noted. The wound margin is flat and intact. There is large (67-100%) red, pink granulation within the wound bed. There is a small (1-33%) amount of necrotic tissue within the wound bed including Adherent Slough. 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 I debrided nonviable tissue. Patient would like to take a break from the cast this week. We discussed that the wound may worsen due to this. She understands this. I recommended Hydrofera Blue and Keystone antibiotic to the left foot wound and continue Keystone antibiotic ointment to the right foot wound. Continue aggressive offloading. Of note she was approved for apligraf. We discussed having this applied in the near future and she was agreeable to this. I would like her to be in a cast for a week prior to Apligraf placement to assure that the wound bed is doing well. KEMAYA, DORNER A. ([id number]) Procedures Wound #11 Pre-procedure diagnosis of  Wound #11 is an Open Surgical Wound located on the Right,Plantar Foot . There was a Excisional Skin/Subcutaneous Tissue Debridement with a total area of 0.32 sq cm performed by Kalman Shan, MD. With the following instrument(s): Curette to remove Viable and Non-Viable tissue/material. Material removed includes Callus, Subcutaneous Tissue, and Slough. A time out was conducted at 10:43, prior to the start of the procedure. A Minimum amount of bleeding was controlled with Pressure. The procedure was tolerated well. Post Debridement Measurements: 0.6cm length x 0.3cm width x 0.6cm depth; 0.085cm^3 volume. Character of Wound/Ulcer Post Debridement is stable. 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 There was a Excisional Skin/Subcutaneous Tissue Debridement with a total area of 6 sq cm performed by Kalman Shan, MD. With the following instrument(s): Curette to remove Viable and Non-Viable tissue/material. Material removed includes Callus, Subcutaneous Tissue, and Slough. A time out was conducted at 10:35, prior to the start of the procedure. A Minimum amount of bleeding was controlled with Pressure. The procedure was tolerated well. Post Debridement Measurements: 2.5cm length x 1.1cm  width x 0.1cm depth; 0.216cm^3 volume. Character of Wound/Ulcer Post Debridement is stable. Severity of Tissue Post Debridement is: Fat layer exposed. Post procedure Diagnosis Wound #12: Same as Pre-Procedure Plan Follow-up Appointments: Return Appointment in 1 week. Nurse Visit as needed Bathing/ Shower/ Hygiene: Wash wounds with antibacterial soap and water. No tub bath. Anesthetic (Use 'Patient Medications' Section for Anesthetic Order Entry): Lidocaine applied to wound bed Edema Control -  Lymphedema / Segmental Compressive Device / Other: Elevate, Exercise Daily and Avoid Standing for Long Periods of Time. Elevate legs to the level of the heart and pump ankles as often as possible Elevate leg(s) parallel to the floor when sitting. Off-Loading: Total Contact Cast to Left Lower Extremity - hold off on cast today per patient request will order Apligraf when next cast is applied contact cast #4 applied Don't get cast wet Other: - keep pressure off of feet. Medications-Please add to medication list.: [employer] - Keystone gel Topical Antibiotic - AandD ointment to broken down area WOUND #11: - Foot Wound Laterality: Plantar, Right Cleanser: Soap and Water 1 x Per Day/30 Days Discharge Instructions: Gently cleanse wound with antibacterial soap, rinse and pat dry prior to dressing wounds Topical: keystone gel 1 x Per Day/30 Days Primary Dressing: Aquacel Extra Hydrofiber Dressing, 2x2 (in/in) 1 x Per Day/30 Days Secondary Dressing: ABD Pad 5x9 (in/in) (Generic) 1 x Per Day/30 Days Discharge Instructions: Cover with ABD pad Secured With: Medipore Tape - 30M Medipore H Soft Cloth Surgical Tape, 2x2 (in/yd) (Generic) 1 x Per Day/30 Days Secured With: Kerlix Roll Sterile or Non-Sterile 6-ply 4.5x4 (yd/yd) (Generic) 1 x Per Day/30 Days Discharge Instructions: Apply Kerlix as directed WOUND #12: - Foot Wound Laterality: Plantar, Left, Medial Cleanser: Soap and Water 1 x Per Day/30 Days Discharge Instructions: Gently cleanse wound with antibacterial soap, rinse and pat dry prior to dressing wounds Topical: Vitamin AandD Ointment, 4(oz) tube 1 x Per Day/30 Days Discharge Instructions: apply to red, broken down area Topical: keystone gel 1 x Per Day/30 Days Discharge Instructions: apply very thin layer to wound Primary Dressing: Hydrofera Blue Ready Transfer Foam, 2.5x2.5 (in/in) 1 x Per Day/30 Days Discharge Instructions: Apply Hydrofera Blue Ready to wound bed as  directed Secondary Dressing: ABD Pad 5x9 (in/in) (Generic) 1 x Per Day/30 Days Discharge Instructions: Cover with ABD pad Secured With: Medipore Tape - 30M Medipore H Soft Cloth Surgical Tape, 2x2 (in/yd) (Generic) 1 x Per Day/30 Days Secured With: Kerlix Roll Sterile or Non-Sterile 6-ply 4.5x4 (yd/yd) (Generic) 1 x Per Day/30 Days Discharge Instructions: Apply Kerlix as directed Pelland, Laveah A. ([id number]) 1. In office sharp debridement 2. Hydrofera Blue and Keystone antibiotic ointment left foot 3. Keystone antibiotic ointment right foot 4. Aggressive offloading surgical shoe with offloading insert 5. Follow-up in 1 week Electronic Signature(s) Signed: 07/06/2022 10:57:22 AM By: Kalman Shan DO Entered By: Kalman Shan on 07/06/2022 10:56:27 [name] ([id number]) -------------------------------------------------------------------------------- ROS/PFSH Details Patient Name: Heather Dear A. Date of Service: 07/06/2022 9:45 AM Medical Record Number: [id number] Patient Account Number: [id number] Date of Birth/Sex: 1976/04/28 (46 y.o. F) Treating RN: Cornell Barman Primary Care Provider: Raelene Bott Other Clinician: Massie Kluver Referring Provider: Raelene Bott Treating Provider/Extender: Yaakov Guthrie in Treatment: 16 Information Obtained From Patient Ear/Nose/Mouth/Throat Medical History: Positive for: Chronic sinus problems/congestion; Middle ear problems Hematologic/Lymphatic Medical History: Positive for: Anemia Respiratory Medical History: Positive for: Chronic Obstructive Pulmonary Disease (COPD) Cardiovascular Medical History: Positive for: Congestive Heart Failure - EF 33% Endocrine Medical History: Positive  for: Type II Diabetes Time with diabetes: 14 years Treated with: Insulin, Oral agents Blood sugar tested every day: No Blood sugar testing results: Bedtime: 176 Genitourinary Medical History: Positive for: End Stage Renal  Disease Integumentary (Skin) Medical History: Positive for: History of pressure wounds Neurologic Medical History: Positive for: Neuropathy - feel and legs HBO Extended History Items Ear/Nose/Mouth/Throat: Ear/Nose/Mouth/Throat: Chronic sinus problems/congestion Middle ear problems Immunizations Pneumococcal Vaccine: Received Pneumococcal Vaccination: No Sh[place], Eh A. ([id number]) Implantable Devices No devices added Family and Social History Current every day smoker; Marital Status - Single; Alcohol Use: Never; Drug Use: Prior History; Caffeine Use: Daily Electronic Signature(s) Signed: 07/06/2022 10:57:22 AM By: Kalman Shan DO Signed: 07/07/2022 11:19:49 AM By: Gretta Cool, BSN, RN, CWS, [name] RN, BSN Entered By: Kalman Shan on 07/06/2022 10:57:05 [name] ([id number]) -------------------------------------------------------------------------------- [place] Details Patient Name: Heather Dear A. Date of Service: 07/06/2022 Medical Record Number: [id number] Patient Account Number: [id number] Date of Birth/Sex: Oct 22, 1976 (46 y.o. F) Treating RN: Cornell Barman Primary Care Provider: Raelene Bott Other Clinician: Massie Kluver Referring Provider: Raelene Bott Treating Provider/Extender: Yaakov Guthrie in Treatment: 16 Diagnosis Coding ICD-10 Codes Code Description [phone] 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ity 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.621 Type 2 diabetes mellitus with foot ulcer E11.42 Type 2 diabetes mellitus with diabetic polyneuropathy L97.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 of skin Facility Procedures CPT4 Code: [id number] Description: 15379 - DEB SUBQ TISSUE 20 SQ CM/< Modifier: Quantity: 1 CPT4 Code: Description: ICD-10 Diagnosis Description L97.528 Non-pressure chronic ulcer of other part  of left foot with other specified L97.512 Non-pressure chronic ulcer of other part of right foot with fat layer expo E11.621 Type 2 diabetes mellitus with foot  ulcer Modifier: severity sed Quantity: Physician Procedures CPT4 Code: 4327614 Description: 11042 - WC PHYS SUBQ TISS 20 SQ CM Modifier: Quantity: 1 CPT4 Code: Description: ICD-10 Diagnosis Description 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

## 2022-07-13 ENCOUNTER — Encounter (HOSPITAL_BASED_OUTPATIENT_CLINIC_OR_DEPARTMENT_OTHER): Payer: Medicaid Other | Admitting: Internal Medicine

## 2022-07-13 DIAGNOSIS — L97528 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ity: Secondary | ICD-10-CM | POA: Diagnosis not present

## 2022-07-13 DIAGNOSIS — E11621 Type 2 diabetes mellitus with foot ulcer: Secondary | ICD-10-CM

## 2022-07-13 DIAGNOSIS — L97512 Non-pressure chronic ulcer of other part of right foot with fat layer exposed: Secondary | ICD-10-CM

## 2022-07-15 NOTE — Progress Notes (Signed)
[name], [name] ([id number]) Visit Report for 07/13/2022 Arrival Information Details Patient Name: [name], ARMEL [name]. Date of Service: 07/13/2022 9:45 AM Medical Record Number: [id number] Patient Account Number: [id number] Date of Birth/Sex: 1975-12-05 (46 y.o. F) Treating RN: [name] Primary Care [name]: [name] Other Clinician: Massie [name] Referring [name]: [name] Treating [name]/Extender: [name] in Treatment: 5 Visit Information History Since Last Visit All ordered tests and consults were completed: No Patient Arrived: Wheel Chair Added or deleted any medications: No Arrival Time: 10:03 Any new allergies or adverse reactions: No Transfer Assistance: None Had [name] fall or experienced change in No activities of daily living that may affect risk of falls: Hospitalized since last visit: No Pain Present Now: Yes Electronic Signature(s) Signed: 07/14/2022 11:41:41 AM By: [name] Entered By: [name] on 07/13/2022 10:07:49 [name], [name] Kitchen ([id number]) -------------------------------------------------------------------------------- Clinic Level of Care Assessment Details Patient Name: Heather Dear [name]. Date of Service: 07/13/2022 9:45 AM Medical Record Number: [id number] Patient Account Number: [id number] Date of Birth/Sex: 28-Mar-1976 (46 y.o. F) Treating RN: [name] Primary Care Rahm Minix: [name] Other Clinician: Massie [name] Referring [name]: [name] Treating [name]/Extender: [name] in Treatment: 17 Clinic Level of Care Assessment Items TOOL 1 Quantity Score _0  - Use when EandM and Procedure is performed on INITIAL visit 0 ASSESSMENTS - Nursing Assessment / Reassessment _1  - General Physical Exam (combine w/ comprehensive assessment (listed just below) when performed on new 0 pt. evals) _2  - 0 Comprehensive Assessment (HX, ROS, Risk Assessments, Wounds Hx, etc.) ASSESSMENTS - Wound  and Skin Assessment / Reassessment _3  - Dermatologic / Skin Assessment (not related to wound area) 0 ASSESSMENTS - Ostomy and/or Continence Assessment and Care _4  - Incontinence Assessment and Management 0 _5  - 0 Ostomy Care Assessment and Management (repouching, etc.) PROCESS - Coordination of Care _6  - Simple Patient / Family Education for ongoing care 0 _7  - 0 Complex (extensive) Patient / Family Education for ongoing care _8  - 0 Staff obtains Programmer, systems, Records, Test Results / Process Orders _9  - 0 Staff telephones HHA, Nursing Homes / Clarify orders / etc _10  - 0 Routine Transfer to another Facility (non-emergent condition) _11  - 0 Routine Hospital Admission (non-emergent condition) _12  - 0 New Admissions / Biomedical engineer / Ordering NPWT, Apligraf, etc. _13  - 0 Emergency Hospital Admission (emergent condition) PROCESS - Special Needs _14  - Pediatric / Minor Patient Management 0 _15  - 0 Isolation Patient Management _16  - 0 Hearing / Language / Visual special needs _17  - 0 Assessment of Community assistance (transportation, D/C planning, etc.) _18  - 0 Additional assistance / Altered mentation _19  - 0 Support Surface(s) Assessment (bed, cushion, seat, etc.) INTERVENTIONS - Miscellaneous _20  - External ear exam 0 _21  - 0 Patient Transfer (multiple staff / Civil Service fast streamer / Similar devices) _22  - 0 Simple Staple / Suture removal (25 or less) _23  - 0 Complex Staple / Suture removal (26 or more) _24  - 0 Hypo/Hyperglycemic Management (do not check if billed separately) _25  - 0 Ankle / Brachial Index (ABI) - do not check if billed separately Has the patient been seen at the hospital within the last three years: Yes Total Score: 0 Level Of Care: ____ [name] ([id number]) Electronic Signature(s) Signed: 07/14/2022 11:41:41 AM By: [name] Entered By: [name] on 07/13/2022 10:34:02 [name], [name]  ([id number]) -------------------------------------------------------------------------------- Lower Extremity Assessment Details Patient Name: Heather Dear [name]. Date of Service: 07/13/2022 9:45 AM Medical Record Number: [id number] Patient Account Number: [id number] Date  of Birth/Sex: [birth year] (46 y.o. F) Treating RN: [name] Primary Care Javaughn Opdahl: [name] Other Clinician: Massie [name] Referring Lillyana Majette: [name] Treating Melenda Bielak/Extender: [name] in Treatment: 17 Electronic Signature(s) Signed: 07/14/2022 11:41:41 AM By: [name] Signed: 07/15/2022 9:44:45 AM By: Heather Coria RN Entered By: [name] on 07/13/2022 10:20:12 [name] ([id number]) -------------------------------------------------------------------------------- Multi Wound Chart Details Patient Name: Heather Dear [name]. Date of Service: 07/13/2022 9:45 AM Medical Record Number: [id number] Patient Account Number: [id number] Date of Birth/Sex: 1976-09-15 (46 y.o. 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 Comorbid History: Chronic sinus problems/congestion, Chronic sinus problems/congestion, N/[name] Middle ear problems, Anemia, Middle ear problems, Anemia, Chronic Obstructive Pulmonary Chronic Obstructive Pulmonary Disease (COPD), Congestive Heart Disease (COPD), Congestive Heart Failure, Type II Diabetes, End Stage  Failure, Type II Diabetes, End Stage Renal Disease, History of pressure Renal Disease, History of pressure wounds, Neuropathy wounds, Neuropathy Date Acquired: 12/01/2021 03/16/2020 N/[name] Weeks of Treatment: 17 17 N/[name] Wound Status: Open Open N/[name] Wound Recurrence: No No N/[name] Measurements L x W x D (cm) 0.6x0.3x0.5 2x1.2x0.1 N/[name] Area (cm) : 0.141 1.885 N/[name] Volume (cm) : 0.071 0.188 N/[name] % Reduction in Area: 92.40% 60.00% N/[name] % Reduction in Volume: 95.70% 80.00% N/[name] Classification: Full Thickness Without Exposed Grade 3 N/[name] Support Structures Exudate Amount: Medium Medium N/[name] Exudate Type: Serosanguineous Serous N/[name] Exudate Color: red, brown amber N/[name] Wound Margin: Distinct, outline attached Flat and Intact N/[name] Granulation Amount: Medium (34-66%) Large (67-100%) N/[name] Granulation Quality: Pink, Pale Red, Pink N/[name] Necrotic Amount: Medium (34-66%) Small (1-33%) N/[name] Exposed Structures: Fat Layer (Subcutaneous Tissue): Fat Layer (Subcutaneous Tissue): N/[name] Yes Yes Fascia: No Fascia: No Tendon: No Tendon: No Muscle: No Muscle: No Joint: No Joint: No Bone: No Bone: No Epithelialization: Small (1-33%) Small (1-33%) N/[name] Treatment Notes Menden, Fayetta [name]. ([id number]) Electronic Signature(s) Signed: 07/14/2022 11:41:41 AM By: [name] Entered By: [name] on 07/13/2022 10:20:41 [name] ([id number]) -------------------------------------------------------------------------------- [place] Details Patient Name: Heather Dear [name]. Date of Service: 07/13/2022 9:45 AM Medical Record Number: [id number] Patient Account Number: [id number] Date of Birth/Sex: 08/04/1976 (46 y.o. F) Treating RN: [name] Primary Care Sie Formisano: [name] Other Clinician: Massie [name] Referring Avion Kutzer: [name] Treating Kaylib Furness/Extender: [name] in Treatment: 37 Active Inactive Abuse / Safety / Falls / Self Care Management Nursing  Diagnoses: History of Falls Potential for falls Potential for injury related to falls Goals: Patient will not develop complications from immobility Date Initiated: 03/17/2022 Date Inactivated: 04/20/2022 Target Resolution Date: 03/16/2022 Goal Status: Met Patient/caregiver will verbalize understanding of skin care regimen Date Initiated: 03/17/2022 Target Resolution Date: 03/16/2022 Goal Status: Active Patient/caregiver will verbalize/demonstrate measure taken to improve self care Date Initiated: 03/17/2022 Target Resolution Date: 03/16/2022 Goal Status: Active Interventions: Assess fall risk on admission and as needed Provide education on basic hygiene Provide education on personal and home safety Notes: Necrotic Tissue Nursing Diagnoses: Impaired tissue integrity related to necrotic/devitalized tissue Knowledge deficit related to management of necrotic/devitalized tissue Goals: Necrotic/devitalized tissue will be minimized in the wound bed Date Initiated: 03/17/2022 Date Inactivated: 05/04/2022 Target  Resolution Date: 03/16/2022 Goal Status: Met Patient/caregiver will verbalize understanding of reason and process for debridement of necrotic tissue Date Initiated: 03/17/2022 Target Resolution Date: 03/16/2022 Goal Status: Active Interventions: Provide education on necrotic tissue and debridement process Treatment Activities: Apply topical anesthetic as ordered : 03/16/2022 Excisional debridement : 03/16/2022 Notes: Osteomyelitis Nursing Diagnoses: Infection: osteomyelitis Knowledge deficit related to disease process and management MYLEAH, CAVENDISH ([id number]) Goals: Patient/caregiver will verbalize understanding of disease process and disease management Date Initiated: 03/17/2022 Date Inactivated: 04/20/2022 Target Resolution Date: 03/16/2022 Goal Status: Met Patient's osteomyelitis will resolve Date Initiated: 03/17/2022 Target Resolution Date: 03/16/2022 Goal Status:  Active Signs and symptoms for osteomyelitis will be recognized and promptly addressed Date Initiated: 03/17/2022 Target Resolution Date: 03/16/2022 Goal Status: Active Interventions: Assess for signs and symptoms of osteomyelitis resolution every visit Provide education on osteomyelitis Treatment Activities: Systemic antibiotics : 03/16/2022 Notes: Wound/Skin Impairment Nursing Diagnoses: Impaired tissue integrity Knowledge deficit related to smoking impact on wound healing Knowledge deficit related to ulceration/compromised skin integrity Goals: Patient/caregiver will verbalize understanding of skin care regimen Date Initiated: 03/17/2022 Date Inactivated: 04/20/2022 Target Resolution Date: 03/16/2022 Goal Status: Met Ulcer/skin breakdown will have [name] volume reduction of 30% by week 4 Date Initiated: 03/17/2022 Target Resolution Date: 04/13/2022 Goal Status: Active Ulcer/skin breakdown will have [name] volume reduction of 50% by week 8 Date Initiated: 03/17/2022 Target Resolution Date: 05/04/2022 Goal Status: Active Ulcer/skin breakdown will have [name] volume reduction of 80% by week 12 Date Initiated: 03/17/2022 Target Resolution Date: 06/01/2022 Goal Status: Active Ulcer/skin breakdown will heal within 14 weeks Date Initiated: 03/17/2022 Target Resolution Date: 06/15/2022 Goal Status: Active Interventions: Assess ulceration(s) every visit Provide education on ulcer and skin care Treatment Activities: Skin care regimen initiated : 03/16/2022 Notes: Electronic Signature(s) Signed: 07/14/2022 11:41:41 AM By: [name] Signed: 07/15/2022 9:44:45 AM By: Heather Coria RN Entered By: [name] on 07/13/2022 10:20:21 Riviera, Ivyanna [name]. ([id number]) -------------------------------------------------------------------------------- Pain Assessment Details Patient Name: Heather Dear [name]. Date of Service: 07/13/2022 9:45 AM Medical Record Number: [id number] Patient Account Number: [id number] Date  of Birth/Sex: 1976-08-25 (46 y.o. F) Treating RN: [name] Primary Care Chelsey Redondo: [name] Other Clinician: Massie [name] Referring Atley Scarboro: [name] Treating Lashawn Orrego/Extender: [name] in Treatment: 17 Active Problems Location of Pain Severity and Description of Pain Patient Has Paino Yes Site Locations Pain Location: Pain in Ulcers Duration of the Pain. Constant / Intermittento Constant Rate the pain. Current Pain Level: 5 Character of Pain Describe the Pain: Aching, Burning Pain Management and Medication Current Pain Management: Medication: Yes Rest: Yes Electronic Signature(s) Signed: 07/14/2022 11:41:41 AM By: [name] Signed: 07/15/2022 9:44:45 AM By: Heather Coria RN Entered By: [name] on 07/13/2022 10:11:26 [name] ([id number]) -------------------------------------------------------------------------------- Patient/Caregiver Education Details Patient Name: Heather Dear [name]. Date of Service: 07/13/2022 9:45 AM Medical Record Number: [id number] Patient Account Number: [id number] Date of Birth/Gender: 1976-06-22 (46 y.o. F) Treating RN: [name] Primary Care Physician: [name] Other Clinician: Massie [name] Referring Physician: Raelene [name] Treating Physician/Extender: [name] in Treatment: 17 Education Assessment Education Provided To: Patient Education Topics Provided Wound/Skin Impairment: Handouts: Other: continue wound care as directed Methods: Explain/Verbal Responses: State content correctly Electronic Signature(s) Signed: 07/14/2022 11:41:41 AM By: [name] Entered By: [name] on 07/13/2022 10:34:29 Varden, Martavia [name]. ([id number]) -------------------------------------------------------------------------------- Wound Assessment Details Patient Name: Heather Dear [name]. Date of Service: 07/13/2022 9:45 AM Medical Record Number: [id number] Patient Account Number:  [id number] Date of Birth/Sex: [birth year] (46 y.o. F) Treating RN: Epps,  [place] Primary Care Athaliah Baumbach: [name] Other Clinician: Massie [name] Referring Zettie Gootee: [name] Treating Inayah Woodin/Extender: [name] in Treatment: 17 Wound Status Wound Number: 11 Primary Open Surgical Wound Etiology: Wound Location: Right, Plantar Foot Wound Open Wounding Event: Surgical Injury Status: Date Acquired: 12/01/2021 Comorbid Chronic sinus problems/congestion, Middle ear problems, Weeks Of Treatment: 17 History: Anemia, Chronic Obstructive Pulmonary Disease (COPD), Clustered Wound: No Congestive Heart Failure, Type II Diabetes, End Stage Renal Disease, History of pressure wounds, Neuropathy Photos Wound Measurements Length: (cm) 0.6 Width: (cm) 0.3 Depth: (cm) 0.5 Area: (cm) 0.141 Volume: (cm) 0.071 % Reduction in Area: 92.4% % Reduction in Volume: 95.7% Epithelialization: Small (1-33%) Wound Description Classification: Full Thickness Without Exposed Support Structures Wound Margin: Distinct, outline attached Exudate Amount: Medium Exudate Type: Serosanguineous Exudate Color: red, brown Foul Odor After Cleansing: No Slough/Fibrino Yes Wound Bed Granulation Amount: Medium (34-66%) Exposed Structure Granulation Quality: Pink, Pale Fascia Exposed: No Necrotic Amount: Medium (34-66%) Fat Layer (Subcutaneous Tissue) Exposed: Yes Necrotic Quality: Adherent Slough Tendon Exposed: No Muscle Exposed: No Joint Exposed: No Bone Exposed: No Treatment Notes Wound #11 (Foot) Wound Laterality: Plantar, Right Cleanser Soap and Water Discharge Instruction: Gently cleanse wound with antibacterial soap, rinse and pat dry prior to dressing wounds Bazen, Ivett [name]. ([id number]) Peri-Wound Care Topical keystone gel Primary Dressing Aquacel Extra Hydrofiber Dressing, 2x2 (in/in) Secondary Dressing ABD Pad 5x9 (in/in) Discharge Instruction: Cover with ABD pad Secured  With Medipore Tape - 35M Medipore H Soft Cloth Surgical Tape, 2x2 (in/yd) Kerlix Roll Sterile or Non-Sterile 6-ply 4.5x4 (yd/yd) Discharge Instruction: Apply Kerlix as directed Compression Wrap Compression Stockings Add-Ons Electronic Signature(s) Signed: 07/14/2022 11:41:41 AM By: [name] Signed: 07/15/2022 9:44:45 AM By: Heather Coria RN Entered By: [name] on 07/13/2022 10:19:31 [place], [place] ([id number]) -------------------------------------------------------------------------------- Wound Assessment Details Patient Name: Heather Dear [name]. Date of Service: 07/13/2022 9:45 AM Medical Record Number: [id number] Patient Account Number: [id number] Date of Birth/Sex: 1975-11-15 (46 y.o. F) Treating RN: [name] Primary Care Vada Yellen: [name] Other Clinician: Massie [name] Referring Shelsy Seng: [name] Treating Cyra Spader/Extender: [name] in Treatment: 17 Wound Status Wound Number: 12 Primary Diabetic Wound/Ulcer of the Lower Extremity Etiology: Wound Location: Left, Medial, Plantar Foot Wound Open Wounding Event: Pressure Injury Status: Date Acquired: 03/16/2020 Comorbid Chronic sinus problems/congestion, Middle ear problems, Weeks Of Treatment: 17 History: Anemia, Chronic Obstructive Pulmonary Disease (COPD), Clustered Wound: No Congestive Heart Failure, Type II Diabetes, End Stage Renal Disease, History of pressure wounds, Neuropathy Photos Wound Measurements Length: (cm) 2 Width: (cm) 1.2 Depth: (cm) 0.1 Area: (cm) 1.885 Volume: (cm) 0.188 % Reduction in Area: 60% % Reduction in Volume: 80% Epithelialization: Small (1-33%) Wound Description Classification: Grade 3 Wound Margin: Flat and Intact Exudate Amount: Medium Exudate Type: Serous Exudate Color: amber Foul Odor After Cleansing: No Slough/Fibrino Yes Wound Bed Granulation Amount: Large (67-100%) Exposed Structure Granulation Quality: Red, Pink Fascia Exposed:  No Necrotic Amount: Small (1-33%) Fat Layer (Subcutaneous Tissue) Exposed: Yes Necrotic Quality: Adherent Slough Tendon Exposed: No Muscle Exposed: No Joint Exposed: No Bone Exposed: No Treatment Notes Wound #12 (Foot) Wound Laterality: Plantar, Left, Medial Cleanser Soap and Water Discharge Instruction: Gently cleanse wound with antibacterial soap, rinse and pat dry prior to dressing wounds Lamarre, Marlita [name]. ([id number]) Peri-Wound Care Topical Vitamin AandD Ointment, 4(oz) tube Discharge Instruction: apply to red, broken down area keystone gel Discharge Instruction: apply very thin layer to wound Primary Dressing Hydrofera Blue Ready Transfer Foam, 2.5x2.5 (in/in) Discharge Instruction: Apply [facility] Ready  to wound bed as directed Secondary Dressing ABD Pad 5x9 (in/in) Discharge Instruction: Cover with ABD pad Secured With [place] Soft Cloth Surgical Tape, 2x2 (in/yd) Kerlix Roll Sterile or Non-Sterile 6-ply 4.5x4 (yd/yd) Discharge Instruction: Apply Kerlix as directed Compression Wrap Compression Stockings Add-Ons Electronic Signature(s) Signed: 07/14/2022 11:41:41 AM By: [name] Signed: 07/15/2022 9:44:45 AM By: Heather Coria RN Entered By: [name] on 07/13/2022 10:20:02 [name] ([id number]) -------------------------------------------------------------------------------- Vitals Details Patient Name: Heather Dear [name]. Date of Service: 07/13/2022 9:45 AM Medical Record Number: [id number] Patient Account Number: [id number] Date of Birth/Sex: 27-Apr-1976 (46 y.o. F) Treating RN: [name] Primary Care Jorma Tassinari: [name] Other Clinician: Massie [name] Referring Devina Bezold: [name] Treating Karmina Zufall/Extender: [name] in Treatment: 17 Vital Signs Time Taken: 10:08 Temperature (F): 98.3 Height (in): 69 Pulse (bpm): 84 Weight (lbs): 178 Respiratory Rate (breaths/min): 18 Body Mass Index  (BMI): 26.3 Blood Pressure (mmHg): 106/65 Reference Range: 80 - 120 mg / dl Electronic Signature(s) Signed: 07/14/2022 11:41:41 AM By: [name] Entered By: [name] on 07/13/2022 10:11:18

## 2022-07-15 NOTE — Progress Notes (Signed)
[name] ([id number]) Visit Report for 07/13/2022 Chief Complaint Document Details Patient Name: Heather, CUDDEBACK [name]. Date of Service: 07/13/2022 9:45 AM Medical Record Number: [id number] Patient Account Number: [id number] Date of Birth/Sex: 22-Mar-1976 (46 y.o. F) Treating RN: Carlene Coria Primary Care Provider: Raelene Bott Other Clinician: Massie Kluver Referring Provider: Raelene Bott Treating Provider/Extender: Yaakov Guthrie in Treatment: 17 Information Obtained from: Patient Chief Complaint 03/19/2021; patient referred by Dr. Luana Shu who has been looking after her left foot for quite [name] period of time for review of [name] nonhealing area in the left midfoot 03/12/2022; bilateral feet wounds and right lower extremity wound. Electronic Signature(s) Signed: 07/13/2022 12:23:50 PM By: Kalman Shan DO Entered By: Kalman Shan on 07/13/2022 10:35:20 [name] ([id number]) -------------------------------------------------------------------------------- Debridement Details Patient Name: Heather Dear [name]. Date of Service: 07/13/2022 9:45 AM Medical Record Number: [id number] Patient Account Number: [id number] Date of Birth/Sex: January 22, 1976 (46 y.o. F) Treating RN: Carlene Coria Primary Care Provider: Raelene Bott Other Clinician: Massie Kluver Referring Provider: Raelene Bott Treating Provider/Extender: Yaakov Guthrie in Treatment: 17 Debridement Performed for Wound #12 Left,Medial,Plantar Foot Assessment: Performed By: Physician Kalman Shan, MD Debridement Type: Debridement Severity of Tissue Pre Debridement: Fat layer exposed Level of Consciousness (Pre- Awake and Alert procedure): Pre-procedure Verification/Time Out Yes - 10:25 Taken: Start Time: 10:25 Total Area Debrided (L x W): 2.5 (cm) x 1.5 (cm) = 3.75 (cm) Tissue and other material Viable, Non-Viable, Callus, Slough, Subcutaneous, Slough debrided: Level: Skin/Subcutaneous  Tissue Debridement Description: Excisional Instrument: Curette Bleeding: Minimum Hemostasis Achieved: Pressure End Time: 10:30 Response to Treatment: Procedure was tolerated well Level of Consciousness (Post- Awake and Alert procedure): Post Debridement Measurements of Total Wound Length: (cm) 2 Width: (cm) 1.2 Depth: (cm) 0.2 Volume: (cm) 0.377 Character of Wound/Ulcer Post Debridement: Stable Severity of Tissue Post Debridement: Fat layer exposed Post Procedure Diagnosis Same as Pre-procedure Electronic Signature(s) Signed: 07/13/2022 12:23:50 PM By: Kalman Shan DO Signed: 07/14/2022 11:41:41 AM By: Massie Kluver Signed: 07/15/2022 9:44:45 AM By: Carlene Coria RN Entered By: Massie Kluver on 07/13/2022 10:29:43 [name], [name] ([id number]) -------------------------------------------------------------------------------- Debridement Details Patient Name: Heather Dear [name]. Date of Service: 07/13/2022 9:45 AM Medical Record Number: [id number] Patient Account Number: [id number] Date of Birth/Sex: 06/19/1976 (46 y.o. F) Treating RN: Carlene Coria Primary Care Provider: Raelene Bott Other Clinician: Massie Kluver Referring Provider: Raelene Bott Treating Provider/Extender: Yaakov Guthrie in Treatment: 17 Debridement Performed for Wound #11 Right,Plantar Foot Assessment: Performed By: Physician Kalman Shan, MD Debridement Type: Debridement Level of Consciousness (Pre- Awake and Alert procedure): Pre-procedure Verification/Time Out Yes - 10:30 Taken: Start Time: 10:30 Total Area Debrided (L x W): 1 (cm) x 0.5 (cm) = 0.5 (cm) Tissue and other material Viable, Non-Viable, Callus, Slough, Subcutaneous, Slough debrided: Level: Skin/Subcutaneous Tissue Debridement Description: Excisional Instrument: Curette Bleeding: Minimum Hemostasis Achieved: Pressure End Time: 10:34 Response to Treatment: Procedure was tolerated well Level of Consciousness  (Post- Awake and Alert procedure): Post Debridement Measurements of Total Wound Length: (cm) 0.6 Width: (cm) 0.4 Depth: (cm) 0.6 Volume: (cm) 0.113 Character of Wound/Ulcer Post Debridement: Stable Post Procedure Diagnosis Same as Pre-procedure Electronic Signature(s) Signed: 07/13/2022 12:23:50 PM By: Kalman Shan DO Signed: 07/14/2022 11:41:41 AM By: Massie Kluver Signed: 07/15/2022 9:44:45 AM By: Carlene Coria RN Entered By: Massie Kluver on 07/13/2022 10:32:08 [name], [name] ([id number]) -------------------------------------------------------------------------------- HPI Details Patient Name: Heather Dear [name]. Date of Service: 07/13/2022 9:45 AM Medical Record Number: [id number] Patient Account Number: [id number] Date of Birth/Sex: 14-Jan-1976 (46 y.o. F) Treating RN:  Carlene Coria Primary Care Provider: Raelene Bott Other Clinician: Massie Kluver Referring Provider: Raelene Bott Treating Provider/Extender: Yaakov Guthrie in Treatment: 17 History of Present Illness HPI Description: 01/18/18-She is here for initial evaluation of the left great toe ulcer. She is [name] poor historian in regards to timeframe in detail. She states approximately 4 weeks ago she lacerated her toe on something in the house. She followed up with her primary care who placed her on Bactrim and ultimately [name] second dose of Bactrim prior to coming to wound clinic. She states she has been treating the toe with peroxide, Betadine and [name] Band-Aid. She did not check her blood sugar this morning but checked it yesterday morning it was 327; she is unaware of [name] recent A1c and there are no current records. She saw Dr. she would've orthopedics last week for an old injury to the left ankle, she states he did not see her toe, nor did she bring it to his attention. She smokes approximately 1 pack cigarettes [name] day. Her social situation is concerning, she arrives this morning with her mother who appears extremely  intoxicated/under the influence; her mother was asked to leave the room and be monitored by the patient's grandmother. The patient's aunt then accompanied the patient and the room throughout the rest of the appointment. We had [name] lengthy discussion regarding the deleterious effects of uncontrolled hyperglycemia and smoking as it relates to wound healing and overall health. She was strongly encouraged to decrease her smoking and get her diabetes under better control. She states she is currently on [name] diet and has cut down her [facility] consumption. The left toe is erythematous, macerated and slightly edematous with malodor present. The edema in her left foot is below her baseline, there is no erythema streaking. We will treat her with Santyl, doxycycline; we have ordered and xray, culture and provided [name] Peg assist surgical shoe and cultured the wound. 01/25/18-She is here in follow-up evaluation for [name] left great toe ulcer and presents with an abscess to her suprapubic area. She states her blood sugars remain elevated, feeling "sick" and if levels are below 250, but she is trying. She has made no attempt to decrease her smoking stating that we "can't take away her food in her cigarettes". She has been compliant with offloading using the PEG assist you. She is using Santyl daily. the culture obtained last week grew staph aureus and Enterococcus faecalis; continues on the doxycycline and Augmentin was added on Monday. The suprapubic area has erythema, no femoral variation, purple discoloration, minimal induration, was accessed with [name] cotton tip applicator with sanguinopurulent drainage, this was cultured, I suspect the current antibiotic treatment will cover and we will not add anything to her current treatment plan. She was advised to go to urgent care or ER with any change in redness, induration or fever. 02/01/18-She is here in follow-up evaluation for left great toe ulcers and [name] new abdominal abscess  from last week. She was able to use packing until earlier this week, where she "forgot it was there". She states she was feeling ill with GI symptoms last week and was not taking her antibiotic. She states her glucose levels have been predominantly less than 200, with occasional levels between 200-250. She thinks this was contributing to her GI symptoms as they have resolved without intervention. There continues to be significant laceration to left toe, otherwise it clinically looks stable/improved. There is now less superficial opening to the lateral aspect of the  great toe that was residual blister. We will transition to [facility] to all wounds, she will continue her Augmentin. If there is no change or deterioration next week for reculture. 02/08/18-She is here in follow-up evaluation for left great toe ulcer and abdominal ulcer. There is an improvement in both wounds. She has been wrapping her left toe with coban, not by our direction, which has created an area of discoloration to the medial aspect; she has been advised to NOT use coban secondary to her neuropathy. She states her glucose levels have been high over this last week ranging from 200-350, she continues to smoke. She admits to being less compliant with her offloading shoe. We will continue with same treatment plan and she will follow-up next week. 02/15/18-She is here in follow-up evaluation for left great toe ulcer and abdominal ulcer. The abdominal ulcer is epithelialized. The left great toe ulcer is improved and all injury from last week using the Coban wrap is resolved, the lateral ulcer is healed. She admits to noncompliance with wearing offloading shoe and admits to glucose levels being greater than 300 most of the week. She continues to smoke and expresses no desire to quit. There is one area medially that probes deeper than it has historically, erythema to the toe and dorsal foot has consistently waxed and waned. There is no  overt signs of cellulitis or infection but we will culture the wound for any occult infection given the new area of depth and erythema. We will hold off on sensitivities for initiation of antibiotic therapy. 02/22/18-She is here in follow up evaluation for left great toe ulcer. There is overall significant improvement in both wound appearance, erythema and edema with changes made last week. She was not initiated on antibiotic therapy. Culture obtained last week showed oxacillin sensitive staph aureus, sensitive to clindamycin. Clindamycin has been called into the pharmacy but she has been instructed to hold off on initiation secondary to overall clinical improvement and her history of antibiotic intolerance. She has been instructed to contact the clinic with any noted changes/deterioration and the wound, erythema, edema and/or pain. She will follow-up next week. She continues to smoke and her glucose levels remain elevated >250; she admits to compliance with offloading shoe 03/01/18 on evaluation today patient appears to be doing fairly well in regard to her left first toe ulcer. She has been tolerating the dressing changes with the [facility] Dressing without complication and overall this has definitely showed signs of improvement according to records as well is what the patient tells me today. I'm very pleased in that regard. She is having no pain today 03/08/18 She is here for follow up evaluation of [name] left great toe ulcer. She remains non-compliant with glucose control and smoking cessation; glucose levels consistently >200. She states that she got new shoe inserts/peg assist. She admits to compliance with offloading. Since my last evaluation there is significant improvement. We will switch to prisma at this time and she will follow up next week. She is noted to be tachycardic at this appointment, heart rate 120s; she has [name] history of heart rate 70-130 according to our records. She admits to extreme  agitation r/t personal issues; she was advised to monitor her heartrate and contact her physician if it does not return to [name] more normal range (<100). She takes cardizem twice daily. 03/15/18-She is here in follow-up evaluation for left great toe ulcer. She remains noncompliant with glucose control and smoking cessation. She admits to compliance  with wearing offloading shoe. The ulcer is improved/stable and we will continue with the same treatment plan and she will follow-up next week 03/22/18-She is here for evaluation for left great toe ulcer. There continues to be significant improvement despite recurrent hyperglycemia (over 500 yesterday) and she continues to smoke. She has been compliant with offloading and we will continue with same treatment plan and she will follow-up next week. 03/29/18-She is here for evaluation for left great toe ulcer. Despite continuing to smoke and uncontrolled diabetes she continues to improve. She is compliant with offloading shoe. We will continue with the same treatment plan and she will follow-up next week 04/05/18- She is here in follow up evaluation for [name] left great toe ulcer; she presents with small pustule to left fifth toe (resembles ant bite). She admits to compliance with wearing offloading shoe; continues to smoke or have uncontrolled blood glucose control. There is more callus than usual with evidence of bleeding; she denies known trauma. 04/12/18-She is here for evaluation of left great toe ulcer. Despite noncompliance with glycemic control and smoking she continues to make [name], [name] ([id number]) improvement. She continues to wear offloading shoe. The pustule, that was identified last week, to the left fifth toe is resolved. She will follow-up in 2 weeks 05/03/18-she is seen in follow-up evaluation for [name] left great toe ulcer. She is compliant with offloading, otherwise noncompliant with glycemic control and smoking. She has plateaued and there is  minimal improvement noted. We will transition to [facility], replaced the insert to her surgical shoe and she will follow-up in one week 05/10/18- She is here in follow up evaluation for [name] left great toe ulcer. It appears stable despite measurement change. We will continue with same treatment plan and follow up next week. 05/24/18-She is seen in follow-up evaluation for [name] left great toe ulcer. She remains compliant with offloading, has made significant improvement in her diet, decreasing the amount of sugar/soda. She said her recent A1c was 10.9 which is lower than. She did see [name] diabetic nutritionist/educator yesterday. She continues to smoke. We will continue with the same treatment plan and she'll follow-up next week. 05/31/18- She is seen in follow-up evaluation for left great toe ulcer. She continues to remain compliant with offloading, continues to make improvement in her diet, increasing her water and decreasing the amount of sugar/soda. She does continue to smoke with no desire to quit. We will apply Prisma to the depth and Hydrofera Blue over. We have not received insurance authorization for oasis. She will follow up next week. 06/07/18-She is seen in follow-up evaluation for left great toe ulcer. It has stalled according to today's measurements although base appears stable. She says she saw [name] diabetic educator yesterday; her average blood sugars are less than 300 which is an improvement for her. She continues to smoke and states "that's my next step" She continues with water over soda. We will order for xray, culture and reinstate ace wrap compression prior to placing apligraf for next week. She is voicing no complaints or concerns. Her dressing will change to iodoflex over the next week in preparation for apligraf. 06/14/18-She is seen in follow-up evaluation for left great toe ulcer. Plain film x-ray performed last week was negative for osteomyelitis. Wound culture obtained last week grew  strep B and OSSA; she is initiated on keflex and cefdinir today; there is erythema to the toe which could be from ace wrap compression, she has [name] history of wrapping too tight  and has has been encouraged to maintain ace wraps that we place today. We will hold off on application of apligraf today, will apply next week after antibiotic therapy has been initiated. She admits today that she has resumed taking [name] shower with her foot/toe submerged in water, she has been reminded to keep foot/toe out of the bath water. She will be seen in follow up next week 06/21/18-she is seen in follow-up evaluation for left great toe ulcer. She is tolerating antibiotic therapy with no GI disturbance. The wound is stable. Apligraf was applied today. She has been decreasing her smoking, only had 4 cigarettes yesterday and 1 today. She continues being more compliant in diabetic diet. She will follow-up next week for evaluation of site, if stable will remove at 2 weeks. 06/28/18- She is here in follow up evalution. Apligraf was placed last week, she states the dressing fell off on Tuesday and she was dressing with hydrofera blue. She is healed and will be discharged from the clinic today. She has been instructed to continue with smoking cessation, continue monitoring glucose levels, offloading for an additional 4 weeks and continue with hydrofera blue for additional two weeks for any possible microscopic opening. Readmission: 08/07/18 on evaluation today patient presents for reevaluation concerning the ulcer of her right great toe. She was previously discharged on 06/28/18 healed. Nonetheless she states that this began to show signs of drainage she subsequently went to her primary care provider. Subsequently an x-ray was performed on 08/01/18 which was negative. The patient was also placed on antibiotics at that time. Fortunately they should have been effective for the infection. Nonetheless she's been experiencing some  improvement but still has [name] lot of drainage coming from the wound itself. 08/14/18 on evaluation today patient's wound actually does show signs of improvement in regard to the erythema at this point. She has completed the antibiotics. With that being said we did discuss the possibility of placing her in [name] total contact cast as of today although I think that I may want to give this just [name] little bit more time to ensure nothing recurrence as far as her infection is concerned. I do not want to put in the cast and risk infection at that time if things are not completely resolved. With that being said she is gonna require some debridement today. 08/21/18 on evaluation today patient actually appears to be doing okay in regard to her toe ulcer. She's been tolerating the dressing changes without complication. With that being said it does appear that she is ready and in fact I think it's appropriate for [place] to go ahead and initiate the total contact cast today. Nonetheless she will require some sharp debridement to prepare the wound for application. Overall I feel like things have been progressing well but we do need to do something to get this to close more readily. 08/24/18 patient seen today for reevaluation after having had the total contact cast applied on Tuesday. She seems to have done very well the wound appears to be doing great and overall I'm pleased with the progress that she's made. There were no abnormal areas of rubbing from the cast on her lower extremity. 08/30/18 on evaluation today patient actually appears to be completely healed in regard to her plantar toe ulcer. She tells me at this point she's been having [name] lot of issues with the cast. She almost fell [name] couple of times the state shall the step of her dog [name] couple times as  well. This is been [name] very frustrating process for her other nonetheless she has completely healed the wound which is excellent news. Overall there does not appear to be  the evidence of infection at this time which is great news. 09/11/18 evaluation today patient presents for follow-up concerning her great toe ulcer on the left which has unfortunately reopened since I last saw her which was only [name] couple of weeks ago. Unfortunately she was not able to get in to get the shoe and potentially the AFO that's gonna be necessary due to her left foot drop. She continues with offloading shoe but this is not enough to prevent her from reopening it appears. When we last had her in the total contact cast she did well from [name] healing standpoint but unfortunately the wound reopened as soon as she came out of the cast within just [name] couple of weeks. 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 This is also part of the reason why she falls on [name] regular basis. I just do not believe that is gonna be safe for [place] to reinitiate the total contact cast as last time we had this on she fell 3 times one week which is definitely not normal for her. 09/18/18 upon evaluation today the patient actually appears to be doing about the same in regard to her toe ulcer. She did not contact [facility] as I asked her to even though I had given her the prescription. In fact she actually states that she has no idea where the prescription is. She did apparently call [facility] and they told her that all she needed to do was bring the prescription in order to be able to be seen and work on getting the AFO for her left foot. With all that being said she still does not have an appointment and I'm not sure were things stand that regard. I will give her [name] new prescription today in order to contact them to get this set up. 09/25/18 on evaluation today patient actually appears to be doing about the same in regard to her toes  ulcer. She does have [name] small areas which seems to have [name] lot of callous buildup around the edge of the wound which is going to need sharp debridement today. She still is waiting to be scheduled for evaluation with [facility] for possibility of an AFO. She states there supposed to call her tomorrow to get this set up. Unfortunately it does appear that her foot specifically the toe area is showing signs of erythema. There does not appear to be any systemic infection which is in these good news. GREENLY, RARICK [name]. ([id number]) 10/02/18 on evaluation today patient actually appears to be doing about the same in regard to her toe ulcer. This really has not done too well although it's not significantly larger it's also not significantly smaller. She has been tolerating the dressing changes without complication. She actually has her appointment with [facility] and [facility] tomorrow to hopefully be measured for obtaining and AFO splint. I think this would be helpful preventing this from reoccurring. We had contemplated starting the cast this week although to be honest I am reluctant to do that as she's been having nausea, vomiting, and seizure activity over the past three days. She has  [name] history of seizures and have been told is nothing that can be done for these. With that being said I do believe that along with the seizures have the nausea vomiting which upon further questioning doesn't seem to be the normal for her and makes me concerned for the possibility of infection or something else going on. I discussed this with the patient and her mother during the office visit today. I do not feel the wound is effective but maybe something else. The responses this was "this just happens to her at times and we don't know why". They did not seem to be interested in going to the hospital to have this checked out further. 10/09/18 on evaluation today patient presents for follow-up concerning her ongoing toe ulcer. She has  been tolerating the dressing changes without complication. Fortunately there does not appear to be any evidence of infection which is great news however I do think that the patient would benefit from going ahead for with the total contact cast. She's actually in [name] wheelchair today she tells me that she will use her walker if we initiate the cast. I was very specific about the fact that if we were gonna do the cast I wanted to make sure that she was using the walker in order to prevent any falls. She tells me she does not have stairs that she has to traverse on [name] regular basis at her home. She has not had any seizures since last week again that something that happens to her often she tells me she did talk to [employer] and they said that it may take up to three weeks to get the brace approved for her. Hopefully that will not take that long but nonetheless in the meantime I do think the cast could be of benefit. 10/12/18 on evaluation today patient appears to be doing rather well in regard to her toe ulcer. It's just been [name] few days and already this is significantly improved both as far as overall appearance and size. Fortunately there's no sign of infection. She is here for her first obligatory cast change. 10/19/18 Seen today for follow up and management of left great toe ulcer. Wound continues to show improvement. Noted small open area with seroussang drainage with palpation. Denies any increased pain or recent fevers during visit. She will continue calcium alginate with offloading shoe. Denies any questions or concerns during visit. 10/26/18 on evaluation today patient appears to be doing about the same as when I last saw her in regard to her wound bed. Fortunately there does not appear to be any signs of infection. Unfortunately she continues to have [name] breakdown in regard to the toe region any time that she is not in the cast. It takes almost no time at all for this to happen. Nonetheless she still has  not heard anything from the brace being made by [employer] as to when exactly this will be available to her. Fortunately there is no signs of infection at this time. 10/30/18 on evaluation today patient presents for application of the total contact cast as we just received him this morning. Fortunately we are gonna be able to apply this to her today which is great news. She continues to have no significant pain which is good news. Overall I do feel like things have been improving while she was the cast is when she doesn't have [name] cast that things get worse. She still has not really heard anything from [place] regarding her brace.  11/02/18 upon evaluation today patient's wound already appears to be doing significantly better which is good news. Fortunately there does not appear to be any signs of infection also good news. Overall I do think the total contact cast as before is helping to heal this area unfortunately it's just not gonna likely keep the area closed and healed without her getting her brace at least. Again the foot drop is [name] significant issue for her. 11/09/18 on evaluation today patient appears to be doing excellent in regard to her toe ulcer which in fact is completely healed. Fortunately we finally got the situation squared away with the paperwork which was needed to proceed with getting her brace approved by Medicaid. I have filled that out unfortunately that information has been sent to the orthopedic office that I worked at 2 1/2 years ago and not tired Current wound care measures. Fortunately she seems to be doing very well at this time. 11/23/18 on evaluation today patient appears to be doing More Poorly Compared to Last Time I Saw Her. At [facility] She Had Completely Healed. Currently she is continuing to have issues with reopening. She states that she just found out that the brace was approved through Medicaid now she just has to go get measured in order to have this fitted for her and  then made. Subsequently she does not have an appointment for this yet that is going to complicate things we obviously cannot put her back in the cast if we do not have everything measured because they're not gonna be able to measure her foot while she is in the cast. 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 Obviously this is unfortunate and does have me somewhat worried as well. 11/30/18 on evaluation today patient's toe ulcer actually appears to be doing fairly well. The good news is she will be getting her brace in the shoes next week on Wednesday. Hopefully we will be able to get this to heal without having to go back in the cast however she may need the cast in order to get the wound completely heal and then go from there. Fortunately there's no signs of infection at this time. 12/07/18 on evaluation today patient fortunately did receive her brace and she states she could tell this definitely makes her walk better. With that being said she's been having issues with her toe where she noticed yesterday there was [name] lot of tissue that was loosing off this appears to be much larger than what it was previous. She also states that her leg has been read putting much across the top of her foot just about the ankle although this seems to be receiving somewhat. The total area is still red and appears to be someone infected as best I can tell. She is previously taken Bactrim and that may be [name] good option for her today as well. We are gonna see what I wound culture shows as well and I think that this is definitely appropriate. With that being said outside of the culture I still need to initiate something in the interim and that's what I'm gonna go ahead and select Bactrim is [name] good option for her. 12/14/18 on evaluation today patient appears to be doing better in regard to her left great toe ulcer as compared to  last week's evaluation. There's still some erythema although this is significantly improved  which is excellent news. Overall I do believe that she is making good progress is still gonna take some time before she is where I would like her to be from the standpoint of being able to place her back into the total contact cast. Hopefully we will be where we need to be by next week. 12/21/18 on evaluation today patient actually appears to be doing poorly in regard to her toe ulcer. She's been tolerating the dressing changes without complication. Fortunately there's no signs of systemic infection although she does have [name] lot of drainage from the toe ulcer and this does seem to be causing some issues at this point. She does have erythema on the distal portion of her toe that appears to be likely cellulitis. 12/28/18 on evaluation today patient actually appears to be doing [name] little better in my pinion in regard to her toe ulcer. With that being said she still does have some evidence of infection at this time and for her culture she had both E. coli as well as enterococcus as organisms noted on evaluation. For that reason I think that though the Keflex likely has treated the E. coli rather well this has really done nothing for the enterococcus. We are going to have to initiate treatment for this specifically. [name], [name] ([id number]) 01/04/19 on evaluation today patient's toe actually appears to be doing better from the standpoint of infection. She currently would like to see about putting the cash back on I think that this is appropriate as long as she takes care of it and keeps it from getting wet. She is gonna have some drainage we can definitely pass this up with Drawtex and alginate to try to prevent as much drainage as possible from causing the problems. With that being said I do want to at least try her with the cast between now and Tuesday. If there any issues we can't continue to use it then I will  discontinue the use of the cast at that point. 01/08/19 on evaluation today patient actually appears to be doing very well as far as her foot ulcer specifically the great toe on the left is concerned. She did have an area of rubbing on the medial aspect of her left ankle which again is from the cast. Fortunately there's no signs of infection at this point in this appears to be [name] very slight skin breakdown. The patient tells me she felt it rubbing but didn't think it was that bad. Fortunately there is no signs of active infection at this time which is good news. No fevers, chills, nausea, or vomiting noted at this time. 01/15/19 on evaluation today patient actually appears to be doing well in regard to her toe ulcer. Again as previous she seems to do well and she has the cast on which indicates to me that during the time she doesn't have [name] cast on she's putting way too much pressure on this region. Obviously I think that's gonna be an issue as with the current national emergency concerning the Covid-19 Virus it has been recommended that we discontinue the use of total contact casting by the chief medical officer of our company, Dr. Simona Huh. The reasoning is that if [name] patient becomes sick and cannot come into have the cast removed they could not just leave this on for an additional two weeks. Obviously the hospitals also do not want to receive patient's who are sick into the emergency department to potentially contaminate the region and spread  the Covid-19 Virus among other sick individuals within the hospital system. Therefore at this point we are suspending the use of total contact cast until the current emergency subsides. This was all discussed with the patient today as well. 01/22/19 on evaluation today patient's wound on her left great toe appears to be doing slightly worse than previously noted last week. She tells me that she has been on this quite [name] bit in fact she tells me she's been awake for 38  straight hours. This is due to the fact that she's having to care for grandparents because nobody else will. She has been taking care of them for five the last seven days since I've seen her they both have dementia his is from [name] stroke and her grandmother's was progressive. Nonetheless she states even her mom who knows her condition and situation has only help two of those days to take care of them she's been taking care of the rest. Fortunately there does not appear to be any signs of active infection in regard to her toe at this point although obviously it doesn't look as good as it did previous. I think this is directly related to her not taking off the pressure and friction by way of taking things easy. Though I completely understand what's going on. 01/29/19 on evaluation today patient's tools are actually appears to be showing some signs of improvement today compared to last week's evaluation as far as not necessarily the overall size of the wound but the fact that she has some new skin growth in between the two ends of the wound opening. Overall I feel like she has done well she states that she had [name] family member give her what sounds to be [name] CAM walker boot which has been helpful as well. 02/05/19 on evaluation today patient's wound bed actually appears to be doing significantly better in regard to her overall appearance of the size of the wound. With that being said she is still having an issue with offloading efficiently enough to get this to close. Apparently there is some signs of infection at this point as well unfortunately. Previously she's done well of Augmentin I really do not see anything that needs to be culture currently but there theme and cellulitis of the foot that I'm seeing I'm gonna go ahead and place her on an antibiotic today to try to help clear this up. 02/12/2019 on evaluation today patient actually appears to be doing poorly in regard to her overall wound status. She tells  me she has been using her offloading shoe but actually comes in today wearing her tennis shoe with the AFO brace. Again as I previously discussed with her this is really not sufficient to allow the area to heal appropriately. Nonetheless she continues to be somewhat noncompliant and I do wonder based on what she has told my nurse in the past as to whether or not [name] good portion of this noncompliance may be recreational drug and alcohol related. She has had [name] history of heroin overdose and this was fairly recently in the past couple of months that have been seeing her. Nonetheless overall I feel like her wound looks significantly worse today compared to what it was previous. She still has significant erythema despite the Augmentin I am not sure that this is an appropriate medication for her infection I am also concerned that the infection may have gone down into her bone. 02/19/19 on evaluation today patient actually appears to be doing  about the same in regard to her toe ulcer. Unfortunately she continues to show signs of bone exposure and infection at this point. There does not appear to be any evidence of worsening of the infection but I'm also not really sure that it's getting significantly better. She is on the Augmentin which should be sufficient for the Staphylococcus aureus infection that she has at this point. With that being said she may need IV antibiotics to more appropriately treat this. We did have [name] discussion today about hyperbaric option therapy. 02/28/19 on evaluation today patient actually appears to be doing much worse in regard to the wound on her left great toe as compared to even my previous evaluation last week. Unfortunately this seems to be training in [name] pretty poor direction. Her toe was actually now starting to angle laterally and I can actually see the entire joint area of the proximal portion of the digit where is the distal portion of the digit again is no longer even in  contact with the joint line. Unfortunately there's [name] lot more necrotic tissue around the edge and the toe appears to be showing signs of becoming gangrenous in my pinion. I'm very concerned about were things stand at this point. She did see infectious disease and they are planning to send in [name] prescription for Sivextro for her and apparently this has been approved. With that being said I don't think she should avoid taking this but at the same time I'm not sure that it's gonna be sufficient to save her toe at this point. She tells me that she still having to care for grandparents which I think is putting quite [name] bit of strain on her foot and specifically the total area and has caused this to break down even to [name] greater degree than would've otherwise been expected. 03/05/19 on evaluation today patient actually appears to be doing quite well in regard to her toe all things considering. She still has bone exposed but there appears to be much less your thing on overall the appearance of the wound and the toe itself is dramatically improved. She still does have some issues currently obviously with infection she did see vascular as well and there concerned that her blood flow to the toad. For that reason they are setting up for an angiogram next week. 03/14/19 on evaluation today patient appears to be doing very poor in regard to her toe and specifically in regard to the ulceration and the fact that she's starting to notice the toe was leaning even more towards the lateral aspect and the complete joint is visible on the proximal aspect of the joint. Nonetheless she's also noted [name] significant odor and the tip of the toe is turning more dark and necrotic appearing. Overall I think she is getting worse not better as far as this is concerned. For that reason I am recommending at this point that she likely needs to be seen for likely amputation. READMISSION 03/19/2021 This is [name] patient that we cared for in this  clinic for [name] prolonged period of time in 2019 and 2020 with [name] left foot and left first toe wound. 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 ([id number]) 04/09/20 by Dr. Luana Shu. In December 2021 she had an ulcer on her right great toe as well as the fourth and fifth toes. She underwent [name] partial ray amputation  of the right fourth and fifth toes. She also had an angiogram at that time and underwent angioplasty of the right anterior tibial artery. In any case she claims that the wound on the right foot is closed I did not look at this today which was probably an oversight although I think that should be done next week. 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 According to Dr. Deborra Medina last review that she was out of the area being cared for by another physician but recently came back to his attention. 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 [name] culture of this area showed E. coli apparently before she came back to see Dr. Luana Shu she was supposed to be receiving antibiotics but she did not really take them. Nor is she offloading this area at all. Finally her last hemoglobin A1c listed in epic was in March 2022 at 14.1 she says things are [name] lot better since then although I am not sure. 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 She had this wound at the time. During this admission she had E. coli sepsis [name] TEE was negative. 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 She is still [name] heavy  cigarette smoker. She has [name] history of bipolar disorder seizure disorder ABI in our clinic was 1.05 6/1; left midfoot in the setting of [name] TMA done previously. Round circular wound with [name] "knuckle" of protruding tissue. The problem is that the knuckle was not attached to any of the surrounding granulation and this probed proximally widely I removed [name] large portion of this tissue. This wound goes with considerable undermining laterally. I do not feel any bone there was no purulence but this is [name] deep wound. 6/8; in spite of the debridement I did last week. She arrives with [name] wound looking exactly the same. [name] protruding "knuckle" of tissue nonadherent to most of the surrounding tissue. There is considerable depth around this from 6-12 o'clock at 2.7 cm and undermining of 1 cm. This does not look overtly infected and the x-ray I did last week was negative for any osseous abnormalities. We have been using silver collagen 6/15; deep tissue culture I did last week showed moderate staph aureus and moderate Pseudomonas. This will definitely require prolonged antibiotic therapy. The pathology on the protuberant area was negative for malignancy fungus etc. 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 We have been using silver collagen. I am going to be prescribing Levaquin for [address] scan of the foot is down for 7/5 6/22; CT scan of the foot on 7 5. She says she has hardware in the left leg from her previous fracture. She is on the Levaquin for the deep tissue culture I did that showed methicillin sensitive staph aureus and Pseudomonas. I gave her [name] 2-week supply and she will have another week. She arrives in clinic today with the same protuberant tissue however this is nonadherent to the tissue surrounding it. I am really at [name] loss to explain this unless there is underlying deep tissue infection 6/29; patient presents for 1 week follow-up. She  has been using  collagen to the wound bed. She reports taking her antibiotics as prescribed.She has no complaints or issues today. She denies signs of infection. 7/6; patient presents for one week followup. She has been using collagen to the wound bed. She states she is taking Levaquin however at times she is not able to keep it down. She denies signs of infection. 7/13; patient presents for 1 week follow-up. She has been using silver alginate to the wound bed. She still has nausea when taking her antibiotics. She denies signs of infection. 7/20; patient presents for 1 week follow-up. She has been using silver alginate with gentamicin cream to the wound bed. She denies any issues and has no complaints today. She denies signs of infection. 7/27; patient presents for 1 week follow-up. She continues to use silver alginate with gentamicin cream to the wound bed. She reports starting her antibiotics. She has no issues or complaints. Overall she reports stability to the wound. 8/3; patient presents for 1 week follow-up. She has been using silver alginate with gentamicin cream to the wound bed. She reports completing all antibiotics. She has no issues or complaints today. She denies signs of infection. 8/17; patient presents for 2-week follow-up. He is to use silver alginate to the wound bed. She has no issues or complaints today. She denies signs of infection. She reports her pain has improved in her foot since last clinic visit 8/24; patient presents for 1 week follow-up. She continues to use silver alginate to the wound bed. She has no issues or complaints. She denies signs of infection. Pain is stable. 9/7; patient presents for follow-up. She missed her last week appointment due to feeling ill. She continues to use silver alginate. She has [name] new wound to the right lower extremity that is covered in eschar. She states It occurred over the past week and has no idea how it started. She currently denies  signs of infection. 9/14; patient presents for follow-up. To the left foot wound she has been using gentamicin cream and silver alginate. To the right lower extremity wound she has been keeping this covered and has not obtain Santyl. 9/21; patient presents for follow-up. She reports using gentamicin cream and silver alginate to the left foot and Santyl to the right lower extremity wound. She has no issues or complaints today. She denies signs of infection. 9/28; patient presents for follow-up. She reports [name] new wound to her right heel. She states this occurred [name] few days ago and is progressively gotten worse. She has been trying to clean the area with [name] Q-tip and Santyl. She reports stability in the other 2 wounds. She has been using gentamicin cream and silver alginate to the left foot and Santyl to the right lower extremity wound. 10/12; patient presents for follow-up. She reports improvement to the wound beds. She is seeing vein and vascular to discuss the potential of [name] left BKA. She states they are going to do an arteriogram. She continues to use silver alginate with dressing changes to her wounds. 11/2; patient presents for follow-up. She states she has not been doing dressing changes to the wound beds. She states she is not able to offload the areas. She reports chronic pain to her left foot wound. 11/9; patient presents for follow-up. She came in with only socks on. She states she forgot to put on shoes. It is unclear if she is doing any dressing changes. She currently denies systemic signs of infection. 11/16; patient presents for follow-up. She came  again only with socks on. She states she does not wear shoes ever. It is unclear if she does dressing changes. She currently denies systemic signs of infection. 11/23; patient presents for follow-up. She wore her shoes today. It still unclear exactly what dressing she is using for each wound but she did states she obtained Dakin's solution and  has been using this to the left foot wound. She currently denies signs of infection. 11/30; patient presents for follow-up. She has no issues or complaints today. She currently denies signs of infection. 12/7; patient presents for follow-up. She has no issues or complaints today. She has been using Hydrofera Blue to the right heel wound and Dakin solution to the left foot wound. Her right anterior leg wound is healed. She currently denies signs of infection. [name], [name] ([id number]) 12/14; patient presents for follow-up. She has been using Hydrofera Blue to the right heel and Dakin's to the left foot wounds. She has no issues or complaints today. She denies signs of infection. 12/21; patient presents for follow-up. She reports using Hydrofera Blue to the right heel and Dakin's to the left foot wound. She denies signs of infection. 12/28; patient presents for follow-up. She continues to use Dakin's to the left foot wound and Hydrofera Blue to the right heel wound. She denies signs of infection. 1/4; patient presents for follow-up. She has no issues or complaints today. She denies signs of infection. 1/11; patient presents for follow-up. It is unclear if she has been dressing these wounds over the past week. She currently denies signs of infection. 1/18; patient presents for follow-up. She states she has been using Dakin's wet-to-dry dressings to the left foot. She has been using Hydrofera Blue to the right foot foot wound. She states that the anterior right leg wound has reopened and draining serous fluid. She denies signs of infection. 1/25; patient presents for follow-up. She has no issues or complaints today. 2/1; patient presents for follow-up. She has no issues or complaints today. She denies signs of infection. 2/8; patient presents for follow-up. She has lost her surgical shoes. She did not have [name] dressing to the right heel wound. She currently denies signs of infection. 2/15; patient  presents for follow-up. She reports more pain to the right heel today. She denies purulent drainage Or fever/chills 2/22; patient presents for follow-up. She reports taking clindamycin over the past week. She states that she continues to have pain to her right heel. She reports purulent drainage. Readmission 03/16/2022 Ms. Lilliann Rossetti is [name] 46 year old female with [name] past medical history of type 2 diabetes, osteomyelitis to her feet, chronic systolic heart failure and bipolar disorder that presents to the clinic for bilateral feet wounds and right lower extremity wound. She was last seen in our clinic on 12/15/2021. At that time she had purulent drainage coming out of her right plantar foot and I recommended she go to the ED. She states she went to [facility] and has been there for the past 3 months. I cannot see the records. She states she had OR debridement and was on several weeks of IV antibiotics while inpatient. Since discharge she has not been taking care of the wound beds. She had nothing on her feet other than socks today. She currently denies signs of infection. 5/31; patient presents for follow-up. She has been using Dakin's wet-to-dry dressings to the wound beds on her feet bilaterally and antibiotic ointment to the right anterior leg wound. She had [name] wound  culture done at last clinic visit that showed moderate Pseudomonas aeruginosa sensitive to ciprofloxacin. She currently denies systemic signs of infection. 6/14; patient presents for follow-up. She received Keystone 5 days ago and has been using this on the wound beds. She states that last week she had to go to the hospital because she had increased warmth and erythema to the right foot. She was started on 2 oral antibiotics. She states she has been taking these. She currently denies systemic signs of infection. She has no issues or complaints today. 6/21; patient presents for follow-up. She states she has been using  Keystone antibiotics to the wound beds. She has no issues or complaints today. She denies signs of infection. 6/28; patient presents for follow-up. She has been using Keystone antibiotics to the wound beds. She has no issues or complaints today. 7/12; patient presents for follow-up. Has been using Keystone antibiotics to the wound beds with calcium alginate. She has no issues or complaints today. She never followed up with her orthopedic surgeon who did the OR debridement to the right foot. We discussed the total contact cast for the left foot and patient would like to do this next week. 7/19; patient presents for follow-up. She has been using Keystone antibiotics with calcium alginate to the wound beds. She has no issues or complaints today. Patient is in agreement to do the total contact cast of the left foot today. She knows to return later this week for the obligatory cast change. 05-13-2022 upon evaluation today patient's wound which she has the cast of the left leg actually appears to be doing significantly better. Fortunately I do not see any signs of active infection locally or systemically which is great news and overall I am extremely pleased with where we stand currently. 7/26; patient presents for follow-up. She has [name] cast in place for the past week. She states it irritated her shin. Other than that she tolerated the cast well. She states she would like [name] break for 1 week from the cast. We have been using Keystone antibiotic and Aquacel to both wound beds. She denies signs of infection. 8/2; patient presents for follow-up. She has been using Keystone and Aquacel to the wound beds. She denies any issues and has no complaints. She is agreeable to have the cast placed today for the left leg. 06-03-2022 upon evaluation today patient appears to be doing well with regard to her wound she saw some signs of improvement which is great news. Fortunately I do not see any evidence of active infection  locally or systemically at this time which is great news. No fevers, chills, nausea, vomiting, or diarrhea. 8/16; patient presents for follow-up. She has no issues or complaints today. We have been using Keystone and Aquacel to the wound beds. The left lower extremity is in [name] total contact cast. She is tolerated this well. 8/23; patient presents for follow-up. She has had the total contact cast on the left leg for the past week. Unfortunately this has rubbed and broken down the skin to the medial foot. She currently denies signs of infection. She has been using Keystone antibiotic to the right plantar foot wound. SHADOW, STIGGERS [name]. ([id number]) 8/30; patient presents for follow-up. We have held off on the total contact cast for the left leg for the past week. Her wound on the left foot has improved and the previous surrounding breakdown of skin has epithelialized. She has been using Keystone antibiotic to both wound beds. She has no  issues or complaints today. She denies signs of infection. 9/6; patient presents for follow-up. She has ordered her's Keystone antibiotic and this is arriving this week. She has been doing Dakin's wet-to- dry dressings to the wound beds. She denies signs of infection. She is agreeable to the total contact cast today. 9/13; patient presents for follow-up. She states that the cast caused her left leg shin to be sore. She would like to take [name] break from the cast this week. She has been using Keystone antibiotic to the right plantar foot wound. She denies signs of infection. 9/20; patient presents for follow-up. She has been using Keystone antibiotics to the wound beds with calcium alginate to the right foot wound and Hydrofera Blue to the left foot wound. She is agreeable to having the cast placed today. She has been approved for Apligraf and we will order this for next clinic visit. Electronic Signature(s) Signed: 07/13/2022 12:23:50 PM By: Kalman Shan DO Entered By:  Kalman Shan on 07/13/2022 10:36:18 [name] ([id number]) -------------------------------------------------------------------------------- Physical Exam Details Patient Name: Heather Dear [name]. Date of Service: 07/13/2022 9:45 AM Medical Record Number: [id number] Patient Account Number: [id number] Date of Birth/Sex: 10-07-1976 (46 y.o. F) Treating RN: Carlene Coria Primary Care Provider: Raelene Bott Other Clinician: Massie Kluver Referring Provider: Raelene Bott Treating Provider/Extender: Yaakov Guthrie in Treatment: 17 Constitutional . Cardiovascular . Psychiatric . Notes Right foot: To the plantar heel there is an incision site with increased depth. Nonviable tissue at the surface, callus present. No probing to bone. Left foot: To the medial aspect there is an open wound with granulation tissue and nonviable tissue with surrounding callus. Again no signs of infection. Electronic Signature(s) Signed: 07/13/2022 12:23:50 PM By: Kalman Shan DO Entered By: Kalman Shan on 07/13/2022 10:36:44 [name] ([id number]) -------------------------------------------------------------------------------- Physician Orders Details Patient Name: Heather Dear [name]. Date of Service: 07/13/2022 9:45 AM Medical Record Number: [id number] Patient Account Number: [id number] Date of Birth/Sex: Aug 22, 1976 (46 y.o. F) Treating RN: Carlene Coria Primary Care Provider: Raelene Bott Other Clinician: Massie Kluver Referring Provider: Raelene Bott Treating Provider/Extender: Yaakov Guthrie in Treatment: 16 Verbal / Phone Orders: No Diagnosis Coding Follow-up Appointments o Return Appointment in 1 week. o Nurse Visit as needed [employer] o Wash wounds with antibacterial soap and water. o No tub bath. Anesthetic (Use 'Patient Medications' Section for Anesthetic Order Entry) o Lidocaine applied to wound bed Edema Control -  Lymphedema / Segmental Compressive Device / Other o Elevate, Exercise Daily and Avoid Standing for Long Periods of Time. o Elevate legs to the level of the heart and pump ankles as often as possible o Elevate leg(s) parallel to the floor when sitting. Off-Loading o Total Contact Cast to Left Lower Extremity - order Apligraft contact cast #5 applied Don't get cast wet o Other: - keep pressure off of feet. Medications-Please add to medication list. o Keystone Compound - Keystone gel o Topical Antibiotic - AandD ointment to broken down area Wound Treatment Wound #11 - Foot Wound Laterality: Plantar, Right Cleanser: Soap and Water 1 x Per Day/30 Days Discharge Instructions: Gently cleanse wound with antibacterial soap, rinse and pat dry prior to dressing wounds Topical: keystone gel 1 x Per Day/30 Days Primary Dressing: Aquacel Extra Hydrofiber Dressing, 2x2 (in/in) 1 x Per Day/30 Days Secondary Dressing: ABD Pad 5x9 (in/in) (Generic) 1 x Per Day/30 Days Discharge Instructions: Cover with ABD pad Secured With: Medipore Tape - 60M Medipore H Soft Cloth Surgical Tape, 2x2 (in/yd) (Generic)  1 x Per Day/30 Days Secured With: Kerlix Roll Sterile or Non-Sterile 6-ply 4.5x4 (yd/yd) (Generic) 1 x Per Day/30 Days Discharge Instructions: Apply Kerlix as directed Wound #12 - Foot Wound Laterality: Plantar, Left, Medial Cleanser: Soap and Water 1 x Per Day/30 Days Discharge Instructions: Gently cleanse wound with antibacterial soap, rinse and pat dry prior to dressing wounds Topical: Vitamin AandD Ointment, 4(oz) tube 1 x Per Day/30 Days Discharge Instructions: apply to red, broken down area Topical: keystone gel 1 x Per Day/30 Days Discharge Instructions: apply very thin layer to wound Primary Dressing: Hydrofera Blue Ready Transfer Foam, 2.5x2.5 (in/in) 1 x Per Day/30 Days Discharge Instructions: Apply Hydrofera Blue Ready to wound bed as directed [name], [name]  ([id number]) Secondary Dressing: ABD Pad 5x9 (in/in) (Generic) 1 x Per Day/30 Days Discharge Instructions: Cover with ABD pad Secured With: Medipore Tape - 70M Medipore H Soft Cloth Surgical Tape, 2x2 (in/yd) (Generic) 1 x Per Day/30 Days Secured With: Kerlix Roll Sterile or Non-Sterile 6-ply 4.5x4 (yd/yd) (Generic) 1 x Per Day/30 Days Discharge Instructions: Apply Kerlix as directed Electronic Signature(s) Signed: 07/13/2022 12:23:50 PM By: Kalman Shan DO Entered By: Kalman Shan on 07/13/2022 10:40:11 [name], [name] ([id number]) -------------------------------------------------------------------------------- Problem List Details Patient Name: Heather Dear [name]. Date of Service: 07/13/2022 9:45 AM Medical Record Number: [id number] Patient Account Number: [id number] Date of Birth/Sex: Oct 28, 1975 (46 y.o. 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 ([id number]) -------------------------------------------------------------------------------- Progress Note Details Patient Name: Heather Dear [name]. Date  of Service: 07/13/2022 9:45 AM Medical Record Number: [id number] Patient Account Number: [id number] Date of Birth/Sex: 1976-01-08 (46 y.o. F) Treating RN: Carlene Coria Primary Care Provider: Raelene Bott Other Clinician: Massie Kluver Referring Provider: Raelene Bott Treating Provider/Extender: Yaakov Guthrie in Treatment: 17 Subjective Chief Complaint Information obtained from Patient 03/19/2021; patient referred by Dr. Luana Shu who has been looking after her left foot for quite [name] period of time for review of [name] nonhealing area in the left midfoot 03/12/2022; bilateral feet wounds and right lower extremity wound. History of Present Illness (HPI) 01/18/18-She is here for initial evaluation of the left great toe ulcer. She is [name] poor historian in regards to timeframe in detail. She states approximately 4 weeks ago she lacerated her toe on something in the house. She followed up with her primary care who placed her on Bactrim and ultimately [name] second dose of Bactrim prior to coming to wound clinic. She states she has been treating the toe with peroxide, Betadine and [name] Band-Aid. She did not check her blood sugar this morning but checked it yesterday morning it was 327; she is unaware of [name] recent A1c and there are no current records. She saw Dr. she would've orthopedics last week for an old injury to the  left ankle, she states he did not see her toe, nor did she bring it to his attention. She smokes approximately 1 pack cigarettes [name] day. Her social situation is concerning, she arrives this morning with her mother who appears extremely intoxicated/under the influence; her mother was asked to leave the room and be monitored by the patient's grandmother. The patient's aunt then accompanied the patient and the room throughout the rest of the appointment. We had [name] lengthy discussion regarding the deleterious effects of uncontrolled hyperglycemia and smoking as it relates to wound healing and overall  health. She was strongly encouraged to decrease her smoking and get her diabetes under better control. She states she is currently on [name] diet and has cut down her [facility] consumption. The left toe is erythematous, macerated and slightly edematous with malodor present. The edema in her left foot is below her baseline, there is no erythema streaking. We will treat her with Santyl, doxycycline; we have ordered and xray, culture and provided [name] Peg assist surgical shoe and cultured the wound. 01/25/18-She is here in follow-up evaluation for [name] left great toe ulcer and presents with an abscess to her suprapubic area. She states her blood sugars remain elevated, feeling "sick" and if levels are below 250, but she is trying. She has made no attempt to decrease her smoking stating that we "can't take away her food in her cigarettes". She has been compliant with offloading using the PEG assist you. She is using Santyl daily. the culture obtained last week grew staph aureus and Enterococcus faecalis; continues on the doxycycline and Augmentin was added on Monday. The suprapubic area has erythema, no femoral variation, purple discoloration, minimal induration, was accessed with [name] cotton tip applicator with sanguinopurulent drainage, this was cultured, I suspect the current antibiotic treatment will cover and we will not add anything to her current treatment plan. She was advised to go to urgent care or ER with any change in redness, induration or fever. 02/01/18-She is here in follow-up evaluation for left great toe ulcers and [name] new abdominal abscess from last week. She was able to use packing until earlier this week, where she "forgot it was there". She states she was feeling ill with GI symptoms last week and was not taking her antibiotic. She states her glucose levels have been predominantly less than 200, with occasional levels between 200-250. She thinks this was contributing to her GI symptoms as they have  resolved without intervention. There continues to be significant laceration to left toe, otherwise it clinically looks stable/improved. There is now less superficial opening to the lateral aspect of the great toe that was residual blister. We will transition to [facility] to all wounds, she will continue her Augmentin. If there is no change or deterioration next week for reculture. 02/08/18-She is here in follow-up evaluation for left great toe ulcer and abdominal ulcer. There is an improvement in both wounds. She has been wrapping her left toe with coban, not by our direction, which has created an area of discoloration to the medial aspect; she has been advised to NOT use coban secondary to her neuropathy. She states her glucose levels have been high over this last week ranging from 200-350, she continues to smoke. She admits to being less compliant with her offloading shoe. We will continue with same treatment plan and she will follow-up next week. 02/15/18-She is here in follow-up evaluation for left great toe ulcer and abdominal ulcer. The abdominal ulcer is epithelialized. The  left great toe ulcer is improved and all injury from last week using the Coban wrap is resolved, the lateral ulcer is healed. She admits to noncompliance with wearing offloading shoe and admits to glucose levels being greater than 300 most of the week. She continues to smoke and expresses no desire to quit. There is one area medially that probes deeper than it has historically, erythema to the toe and dorsal foot has consistently waxed and waned. There is no overt signs of cellulitis or infection but we will culture the wound for any occult infection given the new area of depth and erythema. We will hold off on sensitivities for initiation of antibiotic therapy. 02/22/18-She is here in follow up evaluation for left great toe ulcer. There is overall significant improvement in both wound appearance, erythema and edema with  changes made last week. She was not initiated on antibiotic therapy. Culture obtained last week showed oxacillin sensitive staph aureus, sensitive to clindamycin. Clindamycin has been called into the pharmacy but she has been instructed to hold off on initiation secondary to overall clinical improvement and her history of antibiotic intolerance. She has been instructed to contact the clinic with any noted changes/deterioration and the wound, erythema, edema and/or pain. She will follow-up next week. She continues to smoke and her glucose levels remain elevated >250; she admits to compliance with offloading shoe 03/01/18 on evaluation today patient appears to be doing fairly well in regard to her left first toe ulcer. She has been tolerating the dressing changes with the [facility] Dressing without complication and overall this has definitely showed signs of improvement according to records as well is what the patient tells me today. I'm very pleased in that regard. She is having no pain today 03/08/18 She is here for follow up evaluation of [name] left great toe ulcer. She remains non-compliant with glucose control and smoking cessation; glucose levels consistently >200. She states that she got new shoe inserts/peg assist. She admits to compliance with offloading. Since my last evaluation there is significant improvement. We will switch to prisma at this time and she will follow up next week. She is noted to be tachycardic at this appointment, heart rate 120s; she has [name] history of heart rate 70-130 according to our records. She admits to extreme agitation r/t personal issues; she was advised to monitor her heartrate and contact her physician if it does not return to [name] more normal range (<100). She takes cardizem twice daily. 03/15/18-She is here in follow-up evaluation for left great toe ulcer. She remains noncompliant with glucose control and smoking cessation. She admits to compliance with wearing  offloading shoe. The ulcer is improved/stable and we will continue with the same treatment plan and she will follow-up next week 03/22/18-She is here for evaluation for left great toe ulcer. There continues to be significant improvement despite recurrent hyperglycemia (over 500 yesterday) and she continues to smoke. She has been compliant with offloading and we will continue with same treatment plan and she will follow-up next week. [name], [name] ([id number]) 03/29/18-She is here for evaluation for left great toe ulcer. Despite continuing to smoke and uncontrolled diabetes she continues to improve. She is compliant with offloading shoe. We will continue with the same treatment plan and she will follow-up next week 04/05/18- She is here in follow up evaluation for [name] left great toe ulcer; she presents with small pustule to left fifth toe (resembles ant bite). She admits to compliance with wearing offloading shoe; continues  to smoke or have uncontrolled blood glucose control. There is more callus than usual with evidence of bleeding; she denies known trauma. 04/12/18-She is here for evaluation of left great toe ulcer. Despite noncompliance with glycemic control and smoking she continues to make improvement. She continues to wear offloading shoe. The pustule, that was identified last week, to the left fifth toe is resolved. She will follow-up in 2 weeks 05/03/18-she is seen in follow-up evaluation for [name] left great toe ulcer. She is compliant with offloading, otherwise noncompliant with glycemic control and smoking. She has plateaued and there is minimal improvement noted. We will transition to [facility], replaced the insert to her surgical shoe and she will follow-up in one week 05/10/18- She is here in follow up evaluation for [name] left great toe ulcer. It appears stable despite measurement change. We will continue with same treatment plan and follow up next week. 05/24/18-She is seen in follow-up  evaluation for [name] left great toe ulcer. She remains compliant with offloading, has made significant improvement in her diet, decreasing the amount of sugar/soda. She said her recent A1c was 10.9 which is lower than. She did see [name] diabetic nutritionist/educator yesterday. She continues to smoke. We will continue with the same treatment plan and she'll follow-up next week. 05/31/18- She is seen in follow-up evaluation for left great toe ulcer. She continues to remain compliant with offloading, continues to make improvement in her diet, increasing her water and decreasing the amount of sugar/soda. She does continue to smoke with no desire to quit. We will apply Prisma to the depth and Hydrofera Blue over. We have not received insurance authorization for oasis. She will follow up next week. 06/07/18-She is seen in follow-up evaluation for left great toe ulcer. It has stalled according to today's measurements although base appears stable. She says she saw [name] diabetic educator yesterday; her average blood sugars are less than 300 which is an improvement for her. She continues to smoke and states "that's my next step" She continues with water over soda. We will order for xray, culture and reinstate ace wrap compression prior to placing apligraf for next week. She is voicing no complaints or concerns. Her dressing will change to iodoflex over the next week in preparation for apligraf. 06/14/18-She is seen in follow-up evaluation for left great toe ulcer. Plain film x-ray performed last week was negative for osteomyelitis. Wound culture obtained last week grew strep B and OSSA; she is initiated on keflex and cefdinir today; there is erythema to the toe which could be from ace wrap compression, she has [name] history of wrapping too tight and has has been encouraged to maintain ace wraps that we place today. We will hold off on application of apligraf today, will apply next week after antibiotic therapy has been initiated.  She admits today that she has resumed taking [name] shower with her foot/toe submerged in water, she has been reminded to keep foot/toe out of the bath water. She will be seen in follow up next week 06/21/18-she is seen in follow-up evaluation for left great toe ulcer. She is tolerating antibiotic therapy with no GI disturbance. The wound is stable. Apligraf was applied today. She has been decreasing her smoking, only had 4 cigarettes yesterday and 1 today. She continues being more compliant in diabetic diet. She will follow-up next week for evaluation of site, if stable will remove at 2 weeks. 06/28/18- She is here in follow up evalution. Apligraf was placed last week, she states  the dressing fell off on Tuesday and she was dressing with hydrofera blue. She is healed and will be discharged from the clinic today. She has been instructed to continue with smoking cessation, continue monitoring glucose levels, offloading for an additional 4 weeks and continue with hydrofera blue for additional two weeks for any possible microscopic opening. Readmission: 08/07/18 on evaluation today patient presents for reevaluation concerning the ulcer of her right great toe. She was previously discharged on 06/28/18 healed. Nonetheless she states that this began to show signs of drainage she subsequently went to her primary care provider. Subsequently an x-ray was performed on 08/01/18 which was negative. The patient was also placed on antibiotics at that time. Fortunately they should have been effective for the infection. Nonetheless she's been experiencing some improvement but still has [name] lot of drainage coming from the wound itself. 08/14/18 on evaluation today patient's wound actually does show signs of improvement in regard to the erythema at this point. She has completed the antibiotics. With that being said we did discuss the possibility of placing her in [name] total contact cast as of today although I think that I may want  to give this just [name] little bit more time to ensure nothing recurrence as far as her infection is concerned. I do not want to put in the cast and risk infection at that time if things are not completely resolved. With that being said she is gonna require some debridement today. 08/21/18 on evaluation today patient actually appears to be doing okay in regard to her toe ulcer. She's been tolerating the dressing changes without complication. With that being said it does appear that she is ready and in fact I think it's appropriate for [place] to go ahead and initiate the total contact cast today. Nonetheless she will require some sharp debridement to prepare the wound for application. Overall I feel like things have been progressing well but we do need to do something to get this to close more readily. 08/24/18 patient seen today for reevaluation after having had the total contact cast applied on Tuesday. She seems to have done very well the wound appears to be doing great and overall I'm pleased with the progress that she's made. There were no abnormal areas of rubbing from the cast on her lower extremity. 08/30/18 on evaluation today patient actually appears to be completely healed in regard to her plantar toe ulcer. She tells me at this point she's been having [name] lot of issues with the cast. She almost fell [name] couple of times the state shall the step of her dog [name] couple times as well. This is been [name] very frustrating process for her other nonetheless she has completely healed the wound which is excellent news. Overall there does not appear to be the evidence of infection at this time which is great news. 09/11/18 evaluation today patient presents for follow-up concerning her great toe ulcer on the left which has unfortunately reopened since I last saw her which was only [name] couple of weeks ago. Unfortunately she was not able to get in to get the shoe and potentially the AFO that's gonna be necessary due to her  left foot drop. She continues with offloading shoe but this is not enough to prevent her from reopening it appears. When we last had her in the total contact cast she did well from [name] healing standpoint but unfortunately the wound reopened as soon as she came out of the cast within just [name]  couple of weeks. 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 This is also part of the reason why she falls on [name] regular basis. I just do not believe that is gonna be safe for [place] to reinitiate the total contact cast as last time we had this on she fell 3 times one week which is definitely not normal for her. 09/18/18 upon evaluation today the patient actually appears to be doing about the same in regard to her toe ulcer. She did not contact [facility] as I asked her to even though I had given her the prescription. In fact she actually states that she has no idea where the prescription is. She did apparently call [facility] and they told her that all she needed to do was bring the prescription in order to be able to be seen and work on getting the AFO for her left foot. With all that being said she still does not have an appointment and I'm not sure were things stand that regard. I will give her [name] new prescription today in order to contact them to get this set up. LAURELAI, LEPP [name]. ([id number]) 09/25/18 on evaluation today patient actually appears to be doing about the same in regard to her toes ulcer. She does have [name] small areas which seems to have [name] lot of callous buildup around the edge of the wound which is going to need sharp debridement today. She still is waiting to be scheduled for evaluation with [facility] for possibility of an AFO. She states there supposed to call her tomorrow to get this set up. Unfortunately it does appear  that her foot specifically the toe area is showing signs of erythema. There does not appear to be any systemic infection which is in these good news. 10/02/18 on evaluation today patient actually appears to be doing about the same in regard to her toe ulcer. This really has not done too well although it's not significantly larger it's also not significantly smaller. She has been tolerating the dressing changes without complication. She actually has her appointment with [facility] and [facility] tomorrow to hopefully be measured for obtaining and AFO splint. I think this would be helpful preventing this from reoccurring. We had contemplated starting the cast this week although to be honest I am reluctant to do that as she's been having nausea, vomiting, and seizure activity over the past three days. She has [name] history of seizures and have been told is nothing that can be done for these. With that being said I do believe that along with the seizures have the nausea vomiting which upon further questioning doesn't seem to be the normal for her and makes me concerned for the possibility of infection or something else going on. I discussed this with the patient and her mother during the office visit today. I do not feel the wound is effective but maybe something else. The responses this was "this just happens to her at times and we don't know why". They did not seem to be interested in going to the hospital to have this checked out further. 10/09/18 on evaluation today patient presents for follow-up concerning her ongoing toe ulcer. She has been tolerating the dressing changes without complication. Fortunately there does not appear to be any evidence of  infection which is great news however I do think that the patient would benefit from going ahead for with the total contact cast. She's actually in [name] wheelchair today she tells me that she will use her walker if we initiate the cast. I was very specific about the  fact that if we were gonna do the cast I wanted to make sure that she was using the walker in order to prevent any falls. She tells me she does not have stairs that she has to traverse on [name] regular basis at her home. She has not had any seizures since last week again that something that happens to her often she tells me she did talk to [employer] and they said that it may take up to three weeks to get the brace approved for her. Hopefully that will not take that long but nonetheless in the meantime I do think the cast could be of benefit. 10/12/18 on evaluation today patient appears to be doing rather well in regard to her toe ulcer. It's just been [name] few days and already this is significantly improved both as far as overall appearance and size. Fortunately there's no sign of infection. She is here for her first obligatory cast change. 10/19/18 Seen today for follow up and management of left great toe ulcer. Wound continues to show improvement. Noted small open area with seroussang drainage with palpation. Denies any increased pain or recent fevers during visit. She will continue calcium alginate with offloading shoe. Denies any questions or concerns during visit. 10/26/18 on evaluation today patient appears to be doing about the same as when I last saw her in regard to her wound bed. Fortunately there does not appear to be any signs of infection. Unfortunately she continues to have [name] breakdown in regard to the toe region any time that she is not in the cast. It takes almost no time at all for this to happen. Nonetheless she still has not heard anything from the brace being made by [employer] as to when exactly this will be available to her. Fortunately there is no signs of infection at this time. 10/30/18 on evaluation today patient presents for application of the total contact cast as we just received him this morning. Fortunately we are gonna be able to apply this to her today which is great news. She  continues to have no significant pain which is good news. Overall I do feel like things have been improving while she was the cast is when she doesn't have [name] cast that things get worse. She still has not really heard anything from [facility] regarding her brace. 11/02/18 upon evaluation today patient's wound already appears to be doing significantly better which is good news. Fortunately there does not appear to be any signs of infection also good news. Overall I do think the total contact cast as before is helping to heal this area unfortunately it's just not gonna likely keep the area closed and healed without her getting her brace at least. Again the foot drop is [name] significant issue for her. 11/09/18 on evaluation today patient appears to be doing excellent in regard to her toe ulcer which in fact is completely healed. Fortunately we finally got the situation squared away with the paperwork which was needed to proceed with getting her brace approved by Medicaid. I have filled that out unfortunately that information has been sent to the orthopedic office that I worked at 2 1/2 years ago and not tired Current wound care  measures. Fortunately she seems to be doing very well at this time. 11/23/18 on evaluation today patient appears to be doing More Poorly Compared to Last Time I Saw Her. At [facility] She Had Completely Healed. Currently she is continuing to have issues with reopening. She states that she just found out that the brace was approved through Medicaid now she just has to go get measured in order to have this fitted for her and then made. Subsequently she does not have an appointment for this yet that is going to complicate things we obviously cannot put her back in the cast if we do not have everything measured because they're not gonna be able to measure her foot while she is in the cast. 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 Obviously this is unfortunate and does have me somewhat worried as well. 11/30/18 on evaluation today patient's toe ulcer actually appears to be doing fairly well. The good news is she will be getting her brace in the shoes next week on Wednesday. Hopefully we will be able to get this to heal without having to go back in the cast however she may need the cast in order to get the wound completely heal and then go from there. Fortunately there's no signs of infection at this time. 12/07/18 on evaluation today patient fortunately did receive her brace and she states she could tell this definitely makes her walk better. With that being said she's been having issues with her toe where she noticed yesterday there was [name] lot of tissue that was loosing off this appears to be much larger than what it was previous. She also states that her leg has been read putting much across the top of her foot just about the ankle although this seems to be receiving somewhat. The total area is still red and appears to be someone infected as best I can tell. She is previously taken Bactrim and that may be [name] good option for her today as well. We are gonna see what I wound culture shows as well and I think that this is definitely appropriate. With that being said outside of the culture I still need to initiate something in the interim and that's what I'm gonna go ahead and select Bactrim is [name] good option for her. 12/14/18 on evaluation today patient appears to be doing better in regard to her left great toe ulcer as compared to last week's evaluation. There's still some erythema although this is significantly improved which is excellent news. Overall I do believe that she is making good progress is still gonna take some time before she is where I would like her to be from the standpoint of being able to place her back into the total contact cast. Hopefully we will be where we need to be by  next week. 12/21/18 on evaluation today patient actually appears to be doing poorly in regard to her toe ulcer. She's been tolerating the dressing changes without complication. Fortunately there's no signs of systemic infection although she does have [name] lot of drainage from the toe ulcer and this does Aerts, Shontavia [name]. ([id number]) seem to be causing some issues at this point. She does have erythema on the distal portion of her toe that appears to be likely cellulitis. 12/28/18 on evaluation today patient actually appears to be doing [name] little better in my pinion  in regard to her toe ulcer. With that being said she still does have some evidence of infection at this time and for her culture she had both E. coli as well as enterococcus as organisms noted on evaluation. For that reason I think that though the Keflex likely has treated the E. coli rather well this has really done nothing for the enterococcus. We are going to have to initiate treatment for this specifically. 01/04/19 on evaluation today patient's toe actually appears to be doing better from the standpoint of infection. She currently would like to see about putting the cash back on I think that this is appropriate as long as she takes care of it and keeps it from getting wet. She is gonna have some drainage we can definitely pass this up with Drawtex and alginate to try to prevent as much drainage as possible from causing the problems. With that being said I do want to at least try her with the cast between now and Tuesday. If there any issues we can't continue to use it then I will discontinue the use of the cast at that point. 01/08/19 on evaluation today patient actually appears to be doing very well as far as her foot ulcer specifically the great toe on the left is concerned. She did have an area of rubbing on the medial aspect of her left ankle which again is from the cast. Fortunately there's no signs of infection at this point in this  appears to be [name] very slight skin breakdown. The patient tells me she felt it rubbing but didn't think it was that bad. Fortunately there is no signs of active infection at this time which is good news. No fevers, chills, nausea, or vomiting noted at this time. 01/15/19 on evaluation today patient actually appears to be doing well in regard to her toe ulcer. Again as previous she seems to do well and she has the cast on which indicates to me that during the time she doesn't have [name] cast on she's putting way too much pressure on this region. Obviously I think that's gonna be an issue as with the current national emergency concerning the Covid-19 Virus it has been recommended that we discontinue the use of total contact casting by the chief medical officer of our company, Dr. Simona Huh. The reasoning is that if [name] patient becomes sick and cannot come into have the cast removed they could not just leave this on for an additional two weeks. Obviously the hospitals also do not want to receive patient's who are sick into the emergency department to potentially contaminate the region and spread the Covid-19 Virus among other sick individuals within the hospital system. Therefore at this point we are suspending the use of total contact cast until the current emergency subsides. This was all discussed with the patient today as well. 01/22/19 on evaluation today patient's wound on her left great toe appears to be doing slightly worse than previously noted last week. She tells me that she has been on this quite [name] bit in fact she tells me she's been awake for 38 straight hours. This is due to the fact that she's having to care for grandparents because nobody else will. She has been taking care of them for five the last seven days since I've seen her they both have dementia his is from [name] stroke and her grandmother's was progressive. Nonetheless she states even her mom who knows her condition and situation has only help two  of those days to take care of them she's been taking care of the rest. Fortunately there does not appear to be any signs of active infection in regard to her toe at this point although obviously it doesn't look as good as it did previous. I think this is directly related to her not taking off the pressure and friction by way of taking things easy. Though I completely understand what's going on. 01/29/19 on evaluation today patient's tools are actually appears to be showing some signs of improvement today compared to last week's evaluation as far as not necessarily the overall size of the wound but the fact that she has some new skin growth in between the two ends of the wound opening. Overall I feel like she has done well she states that she had [name] family member give her what sounds to be [name] CAM walker boot which has been helpful as well. 02/05/19 on evaluation today patient's wound bed actually appears to be doing significantly better in regard to her overall appearance of the size of the wound. With that being said she is still having an issue with offloading efficiently enough to get this to close. Apparently there is some signs of infection at this point as well unfortunately. Previously she's done well of Augmentin I really do not see anything that needs to be culture currently but there theme and cellulitis of the foot that I'm seeing I'm gonna go ahead and place her on an antibiotic today to try to help clear this up. 02/12/2019 on evaluation today patient actually appears to be doing poorly in regard to her overall wound status. She tells me she has been using her offloading shoe but actually comes in today wearing her tennis shoe with the AFO brace. Again as I previously discussed with her this is really not sufficient to allow the area to heal appropriately. Nonetheless she continues to be somewhat noncompliant and I do wonder based on what she has told my nurse in the past as to whether or not [name]  good portion of this noncompliance may be recreational drug and alcohol related. She has had [name] history of heroin overdose and this was fairly recently in the past couple of months that have been seeing her. Nonetheless overall I feel like her wound looks significantly worse today compared to what it was previous. She still has significant erythema despite the Augmentin I am not sure that this is an appropriate medication for her infection I am also concerned that the infection may have gone down into her bone. 02/19/19 on evaluation today patient actually appears to be doing about the same in regard to her toe ulcer. Unfortunately she continues to show signs of bone exposure and infection at this point. There does not appear to be any evidence of worsening of the infection but I'm also not really sure that it's getting significantly better. She is on the Augmentin which should be sufficient for the Staphylococcus aureus infection that she has at this point. With that being said she may need IV antibiotics to more appropriately treat this. We did have [name] discussion today about hyperbaric option therapy. 02/28/19 on evaluation today patient actually appears to be doing much worse in regard to the wound on her left great toe as compared to even my previous evaluation last week. Unfortunately this seems to be training in [name] pretty poor direction. Her toe was actually now starting to angle laterally and I can actually see the entire joint  area of the proximal portion of the digit where is the distal portion of the digit again is no longer even in contact with the joint line. Unfortunately there's [name] lot more necrotic tissue around the edge and the toe appears to be showing signs of becoming gangrenous in my pinion. I'm very concerned about were things stand at this point. She did see infectious disease and they are planning to send in [name] prescription for Sivextro for her and apparently this has been approved. With  that being said I don't think she should avoid taking this but at the same time I'm not sure that it's gonna be sufficient to save her toe at this point. She tells me that she still having to care for grandparents which I think is putting quite [name] bit of strain on her foot and specifically the total area and has caused this to break down even to [name] greater degree than would've otherwise been expected. 03/05/19 on evaluation today patient actually appears to be doing quite well in regard to her toe all things considering. She still has bone exposed but there appears to be much less your thing on overall the appearance of the wound and the toe itself is dramatically improved. She still does have some issues currently obviously with infection she did see vascular as well and there concerned that her blood flow to the toad. For that reason they are setting up for an angiogram next week. 03/14/19 on evaluation today patient appears to be doing very poor in regard to her toe and specifically in regard to the ulceration and the fact that she's starting to notice the toe was leaning even more towards the lateral aspect and the complete joint is visible on the proximal aspect of the joint. Nonetheless she's also noted [name] significant odor and the tip of the toe is turning more dark and necrotic appearing. Overall I think she is getting worse not better as far as this is concerned. For that reason I am recommending at this point that she likely needs to be seen for likely amputation. [name], [name] ([id number]) READMISSION 03/19/2021 This is [name] patient that we cared for in this clinic for [name] prolonged period of time in 2019 and 2020 with [name] left foot and left first toe wound. 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 Luana Shu. In December 2021 she had an ulcer on her right great toe as well as the  fourth and fifth toes. 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 She also had an angiogram at that time and underwent angioplasty of the right anterior tibial artery. In any case she claims that the wound on the right foot is closed I did not look at this today which was probably an oversight although I think that should be done next week. 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 According to Dr. Deborra Medina last review that she was out of the area being cared for by another physician but recently came back to his attention. 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 [name] culture of this area showed E. coli apparently before she came back to see Dr. Luana Shu she was supposed to be receiving antibiotics but she did not really take them. Nor is she offloading this area at all.  Finally her last hemoglobin A1c listed in epic was in March 2022 at 14.1 she says things are [name] lot better since then although I am not sure. 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 She had this wound at the time. During this admission she had E. coli sepsis [name] TEE was negative. 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 She is still [name] heavy cigarette smoker. She has [name] history of bipolar disorder seizure disorder ABI in our clinic was 1.05 6/1; left midfoot in the setting of [name] TMA done previously. Round circular wound with [name] "knuckle" of protruding tissue. The problem is that the knuckle was not attached to any of the surrounding granulation and this probed proximally widely I removed [name] large portion of this  tissue. This wound goes with considerable undermining laterally. I do not feel any bone there was no purulence but this is [name] deep wound. 6/8; in spite of the debridement I did last week. She arrives with [name] wound looking exactly the same. [name] protruding "knuckle" of tissue nonadherent to most of the surrounding tissue. There is considerable depth around this from 6-12 o'clock at 2.7 cm and undermining of 1 cm. This does not look overtly infected and the x-ray I did last week was negative for any osseous abnormalities. We have been using silver collagen 6/15; deep tissue culture I did last week showed moderate staph aureus and moderate Pseudomonas. This will definitely require prolonged antibiotic therapy. The pathology on the protuberant area was negative for malignancy fungus etc. 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 We have been using silver collagen. I am going to be prescribing Levaquin for 2 weeks. Her CT scan of the foot is down for 7/5 6/22; CT scan of the foot on 7 5. She says she has hardware in the left leg from her previous fracture. She is on the Levaquin for the deep tissue culture I did that showed methicillin sensitive staph aureus and Pseudomonas. I gave her [name] 2-week supply and she will have another week. She arrives in clinic today with the same protuberant tissue however this is nonadherent to the tissue surrounding it. I am really at [name] loss to explain this unless there is underlying deep tissue infection 6/29; patient presents for 1 week follow-up. She has been using collagen to the wound bed. She reports taking her antibiotics as prescribed.She has no complaints or issues today. She denies signs of infection. 7/6; patient presents for one week followup. She has been using collagen to the wound bed. She states she is taking Levaquin however at times she is not able to keep it down. She denies signs of infection. 7/13;  patient presents for 1 week follow-up. She has been using silver alginate to the wound bed. She still has nausea when taking her antibiotics. She denies signs of infection. 7/20; patient presents for 1 week follow-up. She has been using silver alginate with gentamicin cream to the wound bed. She denies any issues and has no complaints today. She denies signs of infection. 7/27; patient presents for 1 week follow-up. She continues to use silver alginate with gentamicin cream to the wound bed. She reports starting her antibiotics.  She has no issues or complaints. Overall she reports stability to the wound. 8/3; patient presents for 1 week follow-up. She has been using silver alginate with gentamicin cream to the wound bed. She reports completing all antibiotics. She has no issues or complaints today. She denies signs of infection. 8/17; patient presents for 2-week follow-up. He is to use silver alginate to the wound bed. She has no issues or complaints today. She denies signs of infection. She reports her pain has improved in her foot since last clinic visit 8/24; patient presents for 1 week follow-up. She continues to use silver alginate to the wound bed. She has no issues or complaints. She denies signs of infection. Pain is stable. 9/7; patient presents for follow-up. She missed her last week appointment due to feeling ill. She continues to use silver alginate. She has [name] new wound to the right lower extremity that is covered in eschar. She states It occurred over the past week and has no idea how it started. She currently denies signs of infection. 9/14; patient presents for follow-up. To the left foot wound she has been using gentamicin cream and silver alginate. To the right lower extremity wound she has been keeping this covered and has not obtain Santyl. 9/21; patient presents for follow-up. She reports using gentamicin cream and silver alginate to the left foot and Santyl to the right  lower extremity wound. She has no issues or complaints today. She denies signs of infection. 9/28; patient presents for follow-up. She reports [name] new wound to her right heel. She states this occurred [name] few days ago and is progressively gotten worse. She has been trying to clean the area with [name] Q-tip and Santyl. She reports stability in the other 2 wounds. She has been using gentamicin cream and silver alginate to the left foot and Santyl to the right lower extremity wound. 10/12; patient presents for follow-up. She reports improvement to the wound beds. She is seeing vein and vascular to discuss the potential of [name] left BKA. She states they are going to do an arteriogram. She continues to use silver alginate with dressing changes to her wounds. 11/2; patient presents for follow-up. She states she has not been doing dressing changes to the wound beds. She states she is not able to offload the areas. She reports chronic pain to her left foot wound. 11/9; patient presents for follow-up. She came in with only socks on. She states she forgot to put on shoes. It is unclear if she is doing any dressing changes. She currently denies systemic signs of infection. 11/16; patient presents for follow-up. She came again only with socks on. She states she does not wear shoes ever. It is unclear if she does dressing changes. She currently denies systemic signs of infection. 11/23; patient presents for follow-up. She wore her shoes today. It still unclear exactly what dressing she is using for each wound but she did states she obtained Dakin's solution and has been using this to the left foot wound. She currently denies signs of infection. SHAWN, DANNENBERG ([id number]) 11/30; patient presents for follow-up. She has no issues or complaints today. She currently denies signs of infection. 12/7; patient presents for follow-up. She has no issues or complaints today. She has been using Hydrofera Blue to the right heel wound  and Dakin solution to the left foot wound. Her right anterior leg wound is healed. She currently denies signs of infection. 12/14; patient presents for follow-up. She has been using  Hydrofera Blue to the right heel and Dakin's to the left foot wounds. She has no issues or complaints today. She denies signs of infection. 12/21; patient presents for follow-up. She reports using Hydrofera Blue to the right heel and Dakin's to the left foot wound. She denies signs of infection. 12/28; patient presents for follow-up. She continues to use Dakin's to the left foot wound and Hydrofera Blue to the right heel wound. She denies signs of infection. 1/4; patient presents for follow-up. She has no issues or complaints today. She denies signs of infection. 1/11; patient presents for follow-up. It is unclear if she has been dressing these wounds over the past week. She currently denies signs of infection. 1/18; patient presents for follow-up. She states she has been using Dakin's wet-to-dry dressings to the left foot. She has been using Hydrofera Blue to the right foot foot wound. She states that the anterior right leg wound has reopened and draining serous fluid. She denies signs of infection. 1/25; patient presents for follow-up. She has no issues or complaints today. 2/1; patient presents for follow-up. She has no issues or complaints today. She denies signs of infection. 2/8; patient presents for follow-up. She has lost her surgical shoes. She did not have [name] dressing to the right heel wound. She currently denies signs of infection. 2/15; patient presents for follow-up. She reports more pain to the right heel today. She denies purulent drainage Or fever/chills 2/22; patient presents for follow-up. She reports taking clindamycin over the past week. She states that she continues to have pain to her right heel. She reports purulent drainage. Readmission 03/16/2022 Ms. Meeka Cartelli is [name] 46 year old female with  [name] past medical history of type 2 diabetes, osteomyelitis to her feet, chronic systolic heart failure and bipolar disorder that presents to the clinic for bilateral feet wounds and right lower extremity wound. She was last seen in our clinic on 12/15/2021. At that time she had purulent drainage coming out of her right plantar foot and I recommended she go to the ED. She states she went to [facility] and has been there for the past 3 months. I cannot see the records. She states she had OR debridement and was on several weeks of IV antibiotics while inpatient. Since discharge she has not been taking care of the wound beds. She had nothing on her feet other than socks today. She currently denies signs of infection. 5/31; patient presents for follow-up. She has been using Dakin's wet-to-dry dressings to the wound beds on her feet bilaterally and antibiotic ointment to the right anterior leg wound. She had [name] wound culture done at last clinic visit that showed moderate Pseudomonas aeruginosa sensitive to ciprofloxacin. She currently denies systemic signs of infection. 6/14; patient presents for follow-up. She received Keystone 5 days ago and has been using this on the wound beds. She states that last week she had to go to the hospital because she had increased warmth and erythema to the right foot. She was started on 2 oral antibiotics. She states she has been taking these. She currently denies systemic signs of infection. She has no issues or complaints today. 6/21; patient presents for follow-up. She states she has been using Keystone antibiotics to the wound beds. She has no issues or complaints today. She denies signs of infection. 6/28; patient presents for follow-up. She has been using Keystone antibiotics to the wound beds. She has no issues or complaints today. 7/12; patient presents for follow-up. Has been  using Keystone antibiotics to the wound beds with calcium alginate. She has no  issues or complaints today. She never followed up with her orthopedic surgeon who did the OR debridement to the right foot. We discussed the total contact cast for the left foot and patient would like to do this next week. 7/19; patient presents for follow-up. She has been using Keystone antibiotics with calcium alginate to the wound beds. She has no issues or complaints today. Patient is in agreement to do the total contact cast of the left foot today. She knows to return later this week for the obligatory cast change. 05-13-2022 upon evaluation today patient's wound which she has the cast of the left leg actually appears to be doing significantly better. Fortunately I do not see any signs of active infection locally or systemically which is great news and overall I am extremely pleased with where we stand currently. 7/26; patient presents for follow-up. She has [name] cast in place for the past week. She states it irritated her shin. Other than that she tolerated the cast well. She states she would like [name] break for 1 week from the cast. We have been using Keystone antibiotic and Aquacel to both wound beds. She denies signs of infection. 8/2; patient presents for follow-up. She has been using Keystone and Aquacel to the wound beds. She denies any issues and has no complaints. She is agreeable to have the cast placed today for the left leg. 06-03-2022 upon evaluation today patient appears to be doing well with regard to her wound she saw some signs of improvement which is great news. Fortunately I do not see any evidence of active infection locally or systemically at this time which is great news. No fevers, chills, nausea, vomiting, or diarrhea. LAYALI, FREUND [name]. ([id number]) 8/16; patient presents for follow-up. She has no issues or complaints today. We have been using Keystone and Aquacel to the wound beds. The left lower extremity is in [name] total contact cast. She is tolerated this well. 8/23; patient  presents for follow-up. She has had the total contact cast on the left leg for the past week. Unfortunately this has rubbed and broken down the skin to the medial foot. She currently denies signs of infection. She has been using Keystone antibiotic to the right plantar foot wound. 8/30; patient presents for follow-up. We have held off on the total contact cast for the left leg for the past week. Her wound on the left foot has improved and the previous surrounding breakdown of skin has epithelialized. She has been using Keystone antibiotic to both wound beds. She has no issues or complaints today. She denies signs of infection. 9/6; patient presents for follow-up. She has ordered her's Keystone antibiotic and this is arriving this week. She has been doing Dakin's wet-to- dry dressings to the wound beds. She denies signs of infection. She is agreeable to the total contact cast today. 9/13; patient presents for follow-up. She states that the cast caused her left leg shin to be sore. She would like to take [name] break from the cast this week. She has been using Keystone antibiotic to the right plantar foot wound. She denies signs of infection. 9/20; patient presents for follow-up. She has been using Keystone antibiotics to the wound beds with calcium alginate to the right foot wound and Hydrofera Blue to the left foot wound. She is agreeable to having the cast placed today. She has been approved for Apligraf and we will order this  for next clinic visit. Objective Constitutional Vitals Time Taken: 10:08 AM, Height: 69 in, Weight: 178 lbs, BMI: 26.3, Temperature: 98.3 F, Pulse: 84 bpm, Respiratory Rate: 18 breaths/min, Blood Pressure: 106/65 mmHg. General Notes: Right foot: To the plantar heel there is an incision site with increased depth. Nonviable tissue at the surface, callus present. No probing to bone. Left foot: To the medial aspect there is an open wound with granulation tissue and nonviable tissue  with surrounding callus. Again no signs of infection. Integumentary (Hair, Skin) Wound #11 status is Open. Original cause of wound was Surgical Injury. The date acquired was: 12/01/2021. The wound has been in treatment 17 weeks. The wound is located on the [place]. The wound measures 0.6cm length x 0.3cm width x 0.5cm depth; 0.141cm^2 area and 0.071cm^3 volume. There is Fat Layer (Subcutaneous Tissue) exposed. There is [name] medium amount of serosanguineous drainage noted. The wound margin is distinct with the outline attached to the wound base. There is medium (34-66%) pink, pale granulation within the wound bed. There is [name] medium (34-66%) amount of necrotic tissue within the wound bed including Adherent Slough. Wound #12 status is Open. Original cause of wound was Pressure Injury. The date acquired was: 03/16/2020. The wound has been in treatment 17 weeks. The wound is located on the [place]. The wound measures 2cm length x 1.2cm width x 0.1cm depth; 1.885cm^2 area and 0.188cm^3 volume. There is Fat Layer (Subcutaneous Tissue) exposed. There is [name] medium amount of serous drainage noted. The wound margin is flat and intact. There is large (67-100%) red, pink granulation within the wound bed. There is [name] small (1-33%) amount of necrotic tissue within the wound bed including Adherent Slough. 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 I debrided nonviable tissue. I recommended continuing Hydrofera Blue to the left foot wound with the total contact cast. Patient does not have Keystone antibiotic ointment with  her today. She can continue the ointment to the right foot wound along with calcium alginate. We will order Apligraf for next clinic visit. MARIAISABEL, BODIFORD [name]. ([id number]) Procedures Wound #11 Pre-procedure diagnosis of Wound #11 is an Open Surgical Wound located on the Right,Plantar Foot . There was [name] Excisional Skin/Subcutaneous Tissue Debridement with [name] total area of 0.5 sq cm performed by Kalman Shan, MD. With the following instrument(s): Curette to remove Viable and Non-Viable tissue/material. Material removed includes Callus, Subcutaneous Tissue, and Slough. [name] time out was conducted at 10:30, prior to the start of the procedure. [name] Minimum amount of bleeding was controlled with Pressure. The procedure was tolerated well. Post Debridement Measurements: 0.6cm length x 0.4cm width x 0.6cm depth; 0.113cm^3 volume. Character of Wound/Ulcer Post Debridement is stable. Post procedure Diagnosis Wound #11: Same as Pre-Procedure Wound #12 Pre-procedure diagnosis of Wound #12 is [name] Diabetic Wound/Ulcer of the Lower Extremity located on the Left,Medial,Plantar Foot .Severity of Tissue Pre Debridement is: Fat layer exposed. There was [name] Excisional Skin/Subcutaneous Tissue Debridement with [name] total area of 3.75 sq cm performed by Kalman Shan, MD. With the following instrument(s): Curette to remove Viable and Non-Viable tissue/material. Material removed includes Callus, Subcutaneous Tissue, and Slough. [name] time out was conducted at  10:25, prior to the start of the procedure. [name] Minimum amount of bleeding was controlled with Pressure. The procedure was tolerated well. Post Debridement Measurements: 2cm length x 1.2cm width x 0.2cm depth; 0.377cm^3 volume. Character of Wound/Ulcer Post Debridement is stable. Severity of Tissue Post Debridement is: Fat layer exposed. Post procedure Diagnosis Wound #12: Same as Pre-Procedure Pre-procedure diagnosis of Wound #12 is [name] Diabetic Wound/Ulcer of the Lower  Extremity located on the Left,Medial,Plantar Foot . There was [name] Total Contact Cast Procedure by Kalman Shan, MD. Post procedure Diagnosis Wound #12: Same as Pre-Procedure Plan Follow-up Appointments: Return Appointment in 1 week. Nurse Visit as needed Bathing/ Shower/ Hygiene: Wash wounds with antibacterial soap and water. No tub bath. Anesthetic (Use 'Patient Medications' Section for Anesthetic Order Entry): Lidocaine applied to wound bed Edema Control - Lymphedema / Segmental Compressive Device / Other: Elevate, Exercise Daily and Avoid Standing for Long Periods of Time. Elevate legs to the level of the heart and pump ankles as often as possible Elevate leg(s) parallel to the floor when sitting. Off-Loading: Total Contact Cast to Left Lower Extremity - order Apligraft contact cast #5 applied Don't get cast wet Other: - keep pressure off of feet. Medications-Please add to medication list.: [employer] - Keystone gel Topical Antibiotic - AandD ointment to broken down area WOUND #11: - Foot Wound Laterality: Plantar, Right Cleanser: Soap and Water 1 x Per Day/30 Days Discharge Instructions: Gently cleanse wound with antibacterial soap, rinse and pat dry prior to dressing wounds Topical: keystone gel 1 x Per Day/30 Days Primary Dressing: Aquacel Extra Hydrofiber Dressing, 2x2 (in/in) 1 x Per Day/30 Days Secondary Dressing: ABD Pad 5x9 (in/in) (Generic) 1 x Per Day/30 Days Discharge Instructions: Cover with ABD pad Secured With: Medipore Tape - 40M Medipore H Soft Cloth Surgical Tape, 2x2 (in/yd) (Generic) 1 x Per Day/30 Days Secured With: Kerlix Roll Sterile or Non-Sterile 6-ply 4.5x4 (yd/yd) (Generic) 1 x Per Day/30 Days Discharge Instructions: Apply Kerlix as directed WOUND #12: - Foot Wound Laterality: Plantar, Left, Medial Cleanser: Soap and Water 1 x Per Day/30 Days Discharge Instructions: Gently cleanse wound with antibacterial soap, rinse and pat dry prior to  dressing wounds Topical: Vitamin AandD Ointment, 4(oz) tube 1 x Per Day/30 Days Discharge Instructions: apply to red, broken down area Topical: keystone gel 1 x Per Day/30 Days Discharge Instructions: apply very thin layer to wound Primary Dressing: Hydrofera Blue Ready Transfer Foam, 2.5x2.5 (in/in) 1 x Per Day/30 Days Discharge Instructions: Apply Hydrofera Blue Ready to wound bed as directed Secondary Dressing: ABD Pad 5x9 (in/in) (Generic) 1 x Per Day/30 Days Discharge Instructions: Cover with ABD pad Secured With: Medipore Tape - 40M Medipore H Soft Cloth Surgical Tape, 2x2 (in/yd) (Generic) 1 x Per Day/30 Days Secured With: Kerlix Roll Sterile or Non-Sterile 6-ply 4.5x4 (yd/yd) (Generic) 1 x Per Day/30 Days Discharge Instructions: Apply Kerlix as directed Puder, Miana [name]. ([id number]) 1. In office sharp debridement 2. Total contact cast placed in standard fashion left foot 3. Hydrofera Blue left foot 4. Keystone antibiotic and calcium alginate right foot Electronic Signature(s) Signed: 07/13/2022 12:23:50 PM By: Kalman Shan DO Entered By: Kalman Shan on 07/13/2022 10:39:34 [name] ([id number]) -------------------------------------------------------------------------------- ROS/PFSH Details Patient Name: Heather Dear [name]. Date of Service: 07/13/2022 9:45 AM Medical Record Number: [id number] Patient Account Number: [id number] Date of Birth/Sex: [birth year] (46 y.o. F) Treating RN: Carlene Coria Primary Care Provider: Raelene Bott Other Clinician: Massie Kluver Referring Provider: Raelene Bott Treating Provider/Extender: Yaakov Guthrie in  Treatment: 17 Information Obtained From Patient Ear/Nose/Mouth/Throat Medical History: Positive for: Chronic sinus problems/congestion; Middle ear problems Hematologic/Lymphatic Medical History: Positive for: Anemia Respiratory Medical History: Positive for: Chronic Obstructive Pulmonary Disease  (COPD) Cardiovascular Medical History: Positive for: Congestive Heart Failure - EF 33% Endocrine Medical History: Positive for: Type II Diabetes Time with diabetes: 14 years Treated with: Insulin, Oral agents Blood sugar tested every day: No Blood sugar testing results: Bedtime: 176 Genitourinary Medical History: Positive for: End Stage Renal Disease Integumentary (Skin) Medical History: Positive for: History of pressure wounds Neurologic Medical History: Positive for: Neuropathy - feel and legs HBO Extended History Items Ear/Nose/Mouth/Throat: Ear/Nose/Mouth/Throat: Chronic sinus problems/congestion Middle ear problems Immunizations Pneumococcal Vaccine: Received Pneumococcal Vaccination: No Eble, Tashina [name]. ([id number]) Implantable Devices No devices added Family and Social History Current every day smoker; Marital Status - Single; Alcohol Use: Never; Drug Use: Prior History; Caffeine Use: Daily Electronic Signature(s) Signed: 07/13/2022 12:23:50 PM By: Kalman Shan DO Signed: 07/15/2022 9:44:45 AM By: Carlene Coria RN Entered By: Kalman Shan on 07/13/2022 10:40:22 [name] ([id number]) -------------------------------------------------------------------------------- Total Contact Cast Details Patient Name: Heather Dear [name]. Date of Service: 07/13/2022 9:45 AM Medical Record Number: [id number] Patient Account Number: [id number] Date of Birth/Sex: Jul 06, 1976 (46 y.o. F) Treating RN: Carlene Coria Primary Care Provider: Raelene Bott Other Clinician: Massie Kluver Referring Provider: Raelene Bott Treating Provider/Extender: Yaakov Guthrie in Treatment: 17 Total Contact Cast Applied for Wound Assessment: Wound #12 Left,Medial,Plantar Foot Performed By: Physician Kalman Shan, MD Post Procedure Diagnosis Same as Pre-procedure Electronic Signature(s) Signed: 07/13/2022 12:23:50 PM By: Kalman Shan DO Signed: 07/14/2022 11:41:41 AM By:  Massie Kluver Entered By: Massie Kluver on 07/13/2022 10:32:30 Wey, Xzandria [name]. ([id number]) -------------------------------------------------------------------------------- SuperBill Details Patient Name: Heather Dear [name]. Date of Service: 07/13/2022 Medical Record Number: [id number] Patient Account Number: [id number] Date of Birth/Sex: Oct 28, 1975 (46 y.o. 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 Facility Procedures CPT4 Code: 30076226 Description: 33354 - DEB SUBQ TISSUE 20 SQ CM/< Modifier: Quantity: 1 CPT4 Code: Description: ICD-10 Diagnosis Description 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 L97.512 Non-pressure chronic ulcer of other part of right foot with fat layer expo E11.621 Type 2 diabetes mellitus with foot  ulcer Modifier: severity sed Quantity: Physician Procedures CPT4 Code: [id number] Description: 11042 - WC PHYS SUBQ TISS 20 SQ CM Modifier: Quantity: 1 CPT4 Code: Description: ICD-10 Diagnosis Description 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 L97.512 Non-pressure chronic ulcer of other part of right foot with fat layer expo E11.621 Type 2 diabetes mellitus with foot  ulcer Modifier: severity sed Quantity: Electronic Signature(s) Signed: 07/13/2022 12:23:50 PM By: Kalman Shan DO Entered  By: Kalman Shan on 07/13/2022 10:40:02

## 2022-07-20 ENCOUNTER — Encounter (HOSPITAL_BASED_OUTPATIENT_CLINIC_OR_DEPARTMENT_OTHER): Payer: Medicaid Other | Admitting: Internal Medicine

## 2022-07-20 DIAGNOSIS — E11621 Type 2 diabetes mellitus with foot ulcer: Secondary | ICD-10-CM | POA: Diagnosis not present

## 2022-07-20 DIAGNOSIS — L97528 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ity: Secondary | ICD-10-CM | POA: Diagnosis not present

## 2022-07-22 NOTE — Progress Notes (Signed)
[name], [name] ([id number]) Visit Report for 07/20/2022 Chief Complaint Document Details Patient Name: [name], [name] Date of Service: 07/20/2022 9:45 AM Medical Record Number: [id number] Patient Account Number: [id number] Date of Birth/Sex: [birth year] (46 y.o. F) Treating RN: Cornell Barman Primary Care Provider: Raelene Bott Other Clinician: Massie Kluver Referring Provider: Raelene Bott Treating Provider/Extender: Yaakov Guthrie in Treatment: 18 Information Obtained from: Patient Chief Complaint 03/19/2021; patient referred by Dr. Luana Shu who has been looking after her left foot for quite a period of time for review of a nonhealing area in the left midfoot 03/12/2022; bilateral feet wounds and right lower extremity wound. Electronic Signature(s) Signed: 07/20/2022 1:40:43 PM By: Kalman Shan DO Entered By: Kalman Shan on 07/20/2022 12:21:19 [name] ([id number]) -------------------------------------------------------------------------------- Cellular or Tissue Based Product Details Patient Name: Heather Dear A. Date of Service: 07/20/2022 9:45 AM Medical Record Number: [id number] Patient Account Number: [id number] Date of Birth/Sex: 1975-12-28 (46 y.o. F) Treating RN: Cornell Barman Primary Care Provider: Raelene Bott Other Clinician: Massie Kluver Referring Provider: Raelene Bott Treating Provider/Extender: Yaakov Guthrie in Treatment: 18 Cellular or Tissue Based Product Type Wound #12 Left,Medial,Plantar Foot Applied to: Performed By: Physician Kalman Shan, MD Cellular or Tissue Based Product Apligraf Type: Level of Consciousness (Pre- Awake and Alert procedure): Pre-procedure Verification/Time Out Yes - 10:14 Taken: Location: genitalia / hands / feet / multiple digits Wound Size (sq cm): 2.25 Product Size (sq cm): 44 Waste Size (sq cm): 0.4 Amount of Product Applied (sq cm): 43.6 Instrument Used: Scissors Lot #:  [id number] Order #: 1 Expiration Date: 07/27/2022 Fenestrated: Yes Instrument: Blade Reconstituted: Yes Solution Type: normal saline Solution Amount: 3ml Lot #: [id number] Solution Expiration Date: 01/18/2024 Secured: Yes Secured With: Steri-Strips Dressing Applied: Yes Primary Dressing: bolus Response to Treatment: Procedure was tolerated well Level of Consciousness (Post- Awake and Alert procedure): Post Procedure Diagnosis Same as Pre-procedure Electronic Signature(s) Signed: 07/22/2022 12:10:36 PM By: Massie Kluver Entered By: Massie Kluver on 07/20/2022 10:18:40 [name] ([id number]) -------------------------------------------------------------------------------- Debridement Details Patient Name: Heather Dear A. Date of Service: 07/20/2022 9:45 AM Medical Record Number: [id number] Patient Account Number: [id number] Date of Birth/Sex: [birth year] (46 y.o. F) Treating RN: Cornell Barman Primary Care Provider: Raelene Bott Other Clinician: Massie Kluver Referring Provider: Raelene Bott Treating Provider/Extender: Yaakov Guthrie in Treatment: 18 Debridement Performed for Wound #12 Left,Medial,Plantar Foot Assessment: Performed By: Physician Kalman Shan, MD Debridement Type: Debridement Severity of Tissue Pre Debridement: Fat layer exposed Level of Consciousness (Pre- Awake and Alert procedure): Pre-procedure Verification/Time Out Yes - 10:04 Taken: Start Time: 10:04 Total Area Debrided (L x W): 2.5 (cm) x 0.9 (cm) = 2.25 (cm) Tissue and other material Viable, Non-Viable, Slough, Subcutaneous, Slough debrided: Level: Skin/Subcutaneous Tissue Debridement Description: Excisional Instrument: Curette Bleeding: Minimum Hemostasis Achieved: Pressure End Time: 10:06 Response to Treatment: Procedure was tolerated well Level of Consciousness (Post- Awake and Alert procedure): Post Debridement Measurements of Total Wound Length: (cm)  2.5 Width: (cm) 0.9 Depth: (cm) 0.1 Volume: (cm) 0.177 Character of Wound/Ulcer Post Debridement: Stable Severity of Tissue Post Debridement: Fat layer exposed Post Procedure Diagnosis Same as Pre-procedure Electronic Signature(s) Signed: 07/20/2022 1:40:43 PM By: Kalman Shan DO Signed: 07/20/2022 5:32:28 PM By: Gretta Cool, BSN, RN, CWS, [name] RN, BSN Signed: 07/22/2022 12:10:36 PM By: Massie Kluver Entered By: Massie Kluver on 07/20/2022 10:07:11 [name], [name] ([id number]) -------------------------------------------------------------------------------- Debridement Details Patient Name: Heather Dear A. Date of Service: 07/20/2022 9:45 AM Medical Record Number: [id number] Patient Account Number: [id number] Date of Birth/Sex: 1976-10-16 (  46 y.o. F) Treating RN: Cornell Barman Primary Care Provider: Raelene Bott Other Clinician: Massie Kluver Referring Provider: Raelene Bott Treating Provider/Extender: Yaakov Guthrie in Treatment: 18 Debridement Performed for Wound #11 Right,Plantar Foot Assessment: Performed By: Physician Kalman Shan, MD Debridement Type: Debridement Level of Consciousness (Pre- Awake and Alert procedure): Pre-procedure Verification/Time Out Yes - 10:08 Taken: Start Time: 10:08 Total Area Debrided (L x W): 0.5 (cm) x 0.5 (cm) = 0.25 (cm) Tissue and other material Viable, Non-Viable, Slough, Subcutaneous, Slough debrided: Level: Skin/Subcutaneous Tissue Debridement Description: Excisional Instrument: Curette Bleeding: Minimum Hemostasis Achieved: Pressure End Time: 10:10 Response to Treatment: Procedure was tolerated well Level of Consciousness (Post- Awake and Alert procedure): Post Debridement Measurements of Total Wound Length: (cm) 0.3 Width: (cm) 0.2 Depth: (cm) 0.7 Volume: (cm) 0.033 Character of Wound/Ulcer Post Debridement: Stable Post Procedure Diagnosis Same as Pre-procedure Electronic Signature(s) Signed: 07/20/2022  1:40:43 PM By: Kalman Shan DO Signed: 07/20/2022 5:32:28 PM By: Gretta Cool, BSN, RN, CWS, [name] RN, BSN Signed: 07/22/2022 12:10:36 PM By: Massie Kluver Entered By: Massie Kluver on 07/20/2022 10:12:24 [name], [name] ([id number]) -------------------------------------------------------------------------------- HPI Details Patient Name: Heather Dear A. Date of Service: 07/20/2022 9:45 AM Medical Record Number: [id number] Patient Account Number: [id number] Date of Birth/Sex: 12-10-1975 (46 y.o. F) Treating RN: Cornell Barman Primary Care Provider: Raelene Bott Other Clinician: Massie Kluver Referring Provider: Raelene Bott Treating Provider/Extender: Yaakov Guthrie in Treatment: 18 History of Present Illness HPI Description: 01/18/18-She is here for initial evaluation of the left great toe ulcer. She is a poor historian in regards to timeframe in detail. She states approximately 4 weeks ago she lacerated her toe on something in the house. She followed up with her primary care who placed her on Bactrim and ultimately a second dose of Bactrim prior to coming to wound clinic. She states she has been treating the toe with peroxide, Betadine and a Band-Aid. She did not check her blood sugar this morning but checked it yesterday morning it was 327; she is unaware of a recent A1c and there are no current records. She saw Dr. she would've orthopedics last week for an old injury to the left ankle, she states he did not see her toe, nor did she bring it to his attention. She smokes approximately 1 pack cigarettes a day. Her social situation is concerning, she arrives this morning with her mother who appears extremely intoxicated/under the influence; her mother was asked to leave the room and be monitored by the patient's grandmother. The patient's aunt then accompanied the patient and the room throughout the rest of the appointment. We had a lengthy discussion regarding the deleterious effects of  uncontrolled hyperglycemia and smoking as it relates to wound healing and overall health. She was strongly encouraged to decrease her smoking and get her diabetes under better control. She states she is currently on a diet and has cut down her [facility] consumption. The left toe is erythematous, macerated and slightly edematous with malodor present. The edema in her left foot is below her baseline, there is no erythema streaking. We will treat her with Santyl, doxycycline; we have ordered and xray, culture and provided a Peg assist surgical shoe and cultured the wound. 01/25/18-She is here in follow-up evaluation for a left great toe ulcer and presents with an abscess to her suprapubic area. She states her blood sugars remain elevated, feeling "sick" and if levels are below 250, but she is trying. She has made no attempt to decrease her smoking  stating that we "can't take away her food in her cigarettes". She has been compliant with offloading using the PEG assist you. She is using Santyl daily. the culture obtained last week grew staph aureus and Enterococcus faecalis; continues on the doxycycline and Augmentin was added on Monday. The suprapubic area has erythema, no femoral variation, purple discoloration, minimal induration, was accessed with a cotton tip applicator with sanguinopurulent drainage, this was cultured, I suspect the current antibiotic treatment will cover and we will not add anything to her current treatment plan. She was advised to go to urgent care or ER with any change in redness, induration or fever. 02/01/18-She is here in follow-up evaluation for left great toe ulcers and a new abdominal abscess from last week. She was able to use packing until earlier this week, where she "forgot it was there". She states she was feeling ill with GI symptoms last week and was not taking her antibiotic. She states her glucose levels have been predominantly less than 200, with occasional levels  between 200-250. She thinks this was contributing to her GI symptoms as they have resolved without intervention. There continues to be significant laceration to left toe, otherwise it clinically looks stable/improved. There is now less superficial opening to the lateral aspect of the great toe that was residual blister. We will transition to [facility] to all wounds, she will continue her Augmentin. If there is no change or deterioration next week for reculture. 02/08/18-She is here in follow-up evaluation for left great toe ulcer and abdominal ulcer. There is an improvement in both wounds. She has been wrapping her left toe with coban, not by our direction, which has created an area of discoloration to the medial aspect; she has been advised to NOT use coban secondary to her neuropathy. She states her glucose levels have been high over this last week ranging from 200-350, she continues to smoke. She admits to being less compliant with her offloading shoe. We will continue with same treatment plan and she will follow-up next week. 02/15/18-She is here in follow-up evaluation for left great toe ulcer and abdominal ulcer. The abdominal ulcer is epithelialized. The left great toe ulcer is improved and all injury from last week using the Coban wrap is resolved, the lateral ulcer is healed. She admits to noncompliance with wearing offloading shoe and admits to glucose levels being greater than 300 most of the week. She continues to smoke and expresses no desire to quit. There is one area medially that probes deeper than it has historically, erythema to the toe and dorsal foot has consistently waxed and waned. There is no overt signs of cellulitis or infection but we will culture the wound for any occult infection given the new area of depth and erythema. We will hold off on sensitivities for initiation of antibiotic therapy. 02/22/18-She is here in follow up evaluation for left great toe ulcer. There is  overall significant improvement in both wound appearance, erythema and edema with changes made last week. She was not initiated on antibiotic therapy. Culture obtained last week showed oxacillin sensitive staph aureus, sensitive to clindamycin. Clindamycin has been called into the pharmacy but she has been instructed to hold off on initiation secondary to overall clinical improvement and her history of antibiotic intolerance. She has been instructed to contact the clinic with any noted changes/deterioration and the wound, erythema, edema and/or pain. She will follow-up next week. She continues to smoke and her glucose levels remain elevated >250; she admits  to compliance with offloading shoe 03/01/18 on evaluation today patient appears to be doing fairly well in regard to her left first toe ulcer. She has been tolerating the dressing changes with the [facility] Dressing without complication and overall this has definitely showed signs of improvement according to records as well is what the patient tells me today. I'm very pleased in that regard. She is having no pain today 03/08/18 She is here for follow up evaluation of a left great toe ulcer. She remains non-compliant with glucose control and smoking cessation; glucose levels consistently >200. She states that she got new shoe inserts/peg assist. She admits to compliance with offloading. Since my last evaluation there is significant improvement. We will switch to prisma at this time and she will follow up next week. She is noted to be tachycardic at this appointment, heart rate 120s; she has a history of heart rate 70-130 according to our records. She admits to extreme agitation r/t personal issues; she was advised to monitor her heartrate and contact her physician if it does not return to a more normal range (<100). She takes cardizem twice daily. 03/15/18-She is here in follow-up evaluation for left great toe ulcer. She remains noncompliant with  glucose control and smoking cessation. She admits to compliance with wearing offloading shoe. The ulcer is improved/stable and we will continue with the same treatment plan and she will follow-up next week 03/22/18-She is here for evaluation for left great toe ulcer. There continues to be significant improvement despite recurrent hyperglycemia (over 500 yesterday) and she continues to smoke. She has been compliant with offloading and we will continue with same treatment plan and she will follow-up next week. 03/29/18-She is here for evaluation for left great toe ulcer. Despite continuing to smoke and uncontrolled diabetes she continues to improve. She is compliant with offloading shoe. We will continue with the same treatment plan and she will follow-up next week 04/05/18- She is here in follow up evaluation for a left great toe ulcer; she presents with small pustule to left fifth toe (resembles ant bite). She admits to compliance with wearing offloading shoe; continues to smoke or have uncontrolled blood glucose control. There is more callus than usual with evidence of bleeding; she denies known trauma. 04/12/18-She is here for evaluation of left great toe ulcer. Despite noncompliance with glycemic control and smoking she continues to make Mcquitty, Marabelle A. ([id number]) improvement. She continues to wear offloading shoe. The pustule, that was identified last week, to the left fifth toe is resolved. She will follow-up in 2 weeks 05/03/18-she is seen in follow-up evaluation for a left great toe ulcer. She is compliant with offloading, otherwise noncompliant with glycemic control and smoking. She has plateaued and there is minimal improvement noted. We will transition to [facility], replaced the insert to her surgical shoe and she will follow-up in one week 05/10/18- She is here in follow up evaluation for a left great toe ulcer. It appears stable despite measurement change. We will continue with  same treatment plan and follow up next week. 05/24/18-She is seen in follow-up evaluation for a left great toe ulcer. She remains compliant with offloading, has made significant improvement in her diet, decreasing the amount of sugar/soda. She said her recent A1c was 10.9 which is lower than. She did see a diabetic nutritionist/educator yesterday. She continues to smoke. We will continue with the same treatment plan and she'll follow-up next week. 05/31/18- She is seen in follow-up evaluation for left great  toe ulcer. She continues to remain compliant with offloading, continues to make improvement in her diet, increasing her water and decreasing the amount of sugar/soda. She does continue to smoke with no desire to quit. We will apply Prisma to the depth and Hydrofera Blue over. We have not received insurance authorization for oasis. She will follow up next week. 06/07/18-She is seen in follow-up evaluation for left great toe ulcer. It has stalled according to today's measurements although base appears stable. She says she saw a diabetic educator yesterday; her average blood sugars are less than 300 which is an improvement for her. She continues to smoke and states "that's my next step" She continues with water over soda. We will order for xray, culture and reinstate ace wrap compression prior to placing apligraf for next week. She is voicing no complaints or concerns. Her dressing will change to iodoflex over the next week in preparation for apligraf. 06/14/18-She is seen in follow-up evaluation for left great toe ulcer. Plain film x-ray performed last week was negative for osteomyelitis. Wound culture obtained last week grew strep B and OSSA; she is initiated on keflex and cefdinir today; there is erythema to the toe which could be from ace wrap compression, she has a history of wrapping too tight and has has been encouraged to maintain ace wraps that we place today. We will hold off on application of  apligraf today, will apply next week after antibiotic therapy has been initiated. She admits today that she has resumed taking a shower with her foot/toe submerged in water, she has been reminded to keep foot/toe out of the bath water. She will be seen in follow up next week 06/21/18-she is seen in follow-up evaluation for left great toe ulcer. She is tolerating antibiotic therapy with no GI disturbance. The wound is stable. Apligraf was applied today. She has been decreasing her smoking, only had 4 cigarettes yesterday and 1 today. She continues being more compliant in diabetic diet. She will follow-up next week for evaluation of site, if stable will remove at 2 weeks. 06/28/18- She is here in follow up evalution. Apligraf was placed last week, she states the dressing fell off on Tuesday and she was dressing with hydrofera blue. She is healed and will be discharged from the clinic today. She has been instructed to continue with smoking cessation, continue monitoring glucose levels, offloading for an additional 4 weeks and continue with hydrofera blue for additional two weeks for any possible microscopic opening. Readmission: 08/07/18 on evaluation today patient presents for reevaluation concerning the ulcer of her right great toe. She was previously discharged on 06/28/18 healed. Nonetheless she states that this began to show signs of drainage she subsequently went to her primary care provider. Subsequently an x-ray was performed on 08/01/18 which was negative. The patient was also placed on antibiotics at that time. Fortunately they should have been effective for the infection. Nonetheless she's been experiencing some improvement but still has a lot of drainage coming from the wound itself. 08/14/18 on evaluation today patient's wound actually does show signs of improvement in regard to the erythema at this point. She has completed the antibiotics. With that being said we did discuss the possibility of  placing her in a total contact cast as of today although I think that I may want to give this just a little bit more time to ensure nothing recurrence as far as her infection is concerned. I do not want to put in the cast and  risk infection at that time if things are not completely resolved. With that being said she is gonna require some debridement today. 08/21/18 on evaluation today patient actually appears to be doing okay in regard to her toe ulcer. She's been tolerating the dressing changes without complication. With that being said it does appear that she is ready and in fact I think it's appropriate for [place] to go ahead and initiate the total contact cast today. Nonetheless she will require some sharp debridement to prepare the wound for application. Overall I feel like things have been progressing well but we do need to do something to get this to close more readily. 08/24/18 patient seen today for reevaluation after having had the total contact cast applied on Tuesday. She seems to have done very well the wound appears to be doing great and overall I'm pleased with the progress that she's made. There were no abnormal areas of rubbing from the cast on her lower extremity. 08/30/18 on evaluation today patient actually appears to be completely healed in regard to her plantar toe ulcer. She tells me at this point she's been having a lot of issues with the cast. She almost fell a couple of times the state shall the step of her dog a couple times as well. This is been a very frustrating process for her other nonetheless she has completely healed the wound which is excellent news. Overall there does not appear to be the evidence of infection at this time which is great news. 09/11/18 evaluation today patient presents for follow-up concerning her great toe ulcer on the left which has unfortunately reopened since I last saw her which was only a couple of weeks ago. Unfortunately she was not able to get in  to get the shoe and potentially the AFO that's gonna be necessary due to her left foot drop. She continues with offloading shoe but this is not enough to prevent her from reopening it appears. When we last had her in the total contact cast she did well from a healing standpoint but unfortunately the wound reopened as soon as she came out of the cast within just a couple of weeks. 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 This is also part of the reason why she falls on a regular basis. I just do not believe that is gonna be safe for [place] to reinitiate the total contact cast as last time we had this on she fell 3 times one week which is definitely not normal for her. 09/18/18 upon evaluation today the patient actually appears to be doing about the same in regard to her toe ulcer. She did not contact [employer] as I asked her to even though I had given her the prescription. In fact she actually states that she has no idea where the prescription is. She did apparently call [employer] and they told her that all she needed to do was bring the prescription in order to be able to be seen and work on getting the AFO for her left foot. With all that being said she still does not have an appointment and I'm not sure were things stand that regard. I will give her a new prescription today in order to contact them to get this set up. 09/25/18  on evaluation today patient actually appears to be doing about the same in regard to her toes ulcer. She does have a small areas which seems to have a lot of callous buildup around the edge of the wound which is going to need sharp debridement today. She still is waiting to be scheduled for evaluation with [employer] for possibility of an AFO. She states there supposed to call her tomorrow to  get this set up. Unfortunately it does appear that her foot specifically the toe area is showing signs of erythema. There does not appear to be any systemic infection which is in these good news. [name], [name] ([id number]) 10/02/18 on evaluation today patient actually appears to be doing about the same in regard to her toe ulcer. This really has not done too well although it's not significantly larger it's also not significantly smaller. She has been tolerating the dressing changes without complication. She actually has her appointment with [facility] and [place] tomorrow to hopefully be measured for obtaining and AFO splint. I think this would be helpful preventing this from reoccurring. We had contemplated starting the cast this week although to be honest I am reluctant to do that as she's been having nausea, vomiting, and seizure activity over the past three days. She has a history of seizures and have been told is nothing that can be done for these. With that being said I do believe that along with the seizures have the nausea vomiting which upon further questioning doesn't seem to be the normal for her and makes me concerned for the possibility of infection or something else going on. I discussed this with the patient and her mother during the office visit today. I do not feel the wound is effective but maybe something else. The responses this was "this just happens to her at times and we don't know why". They did not seem to be interested in going to the hospital to have this checked out further. 10/09/18 on evaluation today patient presents for follow-up concerning her ongoing toe ulcer. She has been tolerating the dressing changes without complication. Fortunately there does not appear to be any evidence of infection which is great news however I do think that the patient would benefit from going ahead for with the total contact cast. She's actually in a wheelchair today she tells me that  she will use her walker if we initiate the cast. I was very specific about the fact that if we were gonna do the cast I wanted to make sure that she was using the walker in order to prevent any falls. She tells me she does not have stairs that she has to traverse on a regular basis at her home. She has not had any seizures since last week again that something that happens to her often she tells me she did talk to [employer] and they said that it may take up to three weeks to get the brace approved for her. Hopefully that will not take that long but nonetheless in the meantime I do think the cast could be of benefit. 10/12/18 on evaluation today patient appears to be doing rather well in regard to her toe ulcer. It's just been a few days and already this is significantly improved both as far as overall appearance and size. Fortunately there's no sign of infection. She is here for her first obligatory cast change. 10/19/18 Seen today for follow up and management of left great toe ulcer. Wound continues to show improvement.  Noted small open area with seroussang drainage with palpation. Denies any increased pain or recent fevers during visit. She will continue calcium alginate with offloading shoe. Denies any questions or concerns during visit. 10/26/18 on evaluation today patient appears to be doing about the same as when I last saw her in regard to her wound bed. Fortunately there does not appear to be any signs of infection. Unfortunately she continues to have a breakdown in regard to the toe region any time that she is not in the cast. It takes almost no time at all for this to happen. Nonetheless she still has not heard anything from the brace being made by [employer] as to when exactly this will be available to her. Fortunately there is no signs of infection at this time. 10/30/18 on evaluation today patient presents for application of the total contact cast as we just received him this morning. Fortunately  we are gonna be able to apply this to her today which is great news. She continues to have no significant pain which is good news. Overall I do feel like things have been improving while she was the cast is when she doesn't have a cast that things get worse. She still has not really heard anything from [facility] regarding her brace. 11/02/18 upon evaluation today patient's wound already appears to be doing significantly better which is good news. Fortunately there does not appear to be any signs of infection also good news. Overall I do think the total contact cast as before is helping to heal this area unfortunately it's just not gonna likely keep the area closed and healed without her getting her brace at least. Again the foot drop is a significant issue for her. 11/09/18 on evaluation today patient appears to be doing excellent in regard to her toe ulcer which in fact is completely healed. Fortunately we finally got the situation squared away with the paperwork which was needed to proceed with getting her brace approved by Medicaid. I have filled that out unfortunately that information has been sent to the orthopedic office that I worked at 2 1/2 years ago and not tired Current wound care measures. Fortunately she seems to be doing very well at this time. 11/23/18 on evaluation today patient appears to be doing More Poorly Compared to Last Time I Saw Her. At [facility] She Had Completely Healed. Currently she is continuing to have issues with reopening. She states that she just found out that the brace was approved through Medicaid now she just has to go get measured in order to have this fitted for her and then made. Subsequently she does not have an appointment for this yet that is going to complicate things we obviously cannot put her back in the cast if we do not have everything measured because they're not gonna be able to measure her foot while she is in the cast. Unfortunately the other thing  that I found out today as well is that she was in the hospital over the weekend due to having a heroin overdose. Obviously this is unfortunate and does have me somewhat worried as well. 11/30/18 on evaluation today patient's toe ulcer actually appears to be doing fairly well. The good news is she will be getting her brace in the shoes next week on Wednesday. Hopefully we will be able to get this to heal without having to go back in the cast however she may need the cast in order to get the wound completely heal  and then go from there. Fortunately there's no signs of infection at this time. 12/07/18 on evaluation today patient fortunately did receive her brace and she states she could tell this definitely makes her walk better. With that being said she's been having issues with her toe where she noticed yesterday there was a lot of tissue that was loosing off this appears to be much larger than what it was previous. She also states that her leg has been read putting much across the top of her foot just about the ankle although this seems to be receiving somewhat. The total area is still red and appears to be someone infected as best I can tell. She is previously taken Bactrim and that may be a good option for her today as well. We are gonna see what I wound culture shows as well and I think that this is definitely appropriate. With that being said outside of the culture I still need to initiate something in the interim and that's what I'm gonna go ahead and select Bactrim is a good option for her. 12/14/18 on evaluation today patient appears to be doing better in regard to her left great toe ulcer as compared to last week's evaluation. There's still some erythema although this is significantly improved which is excellent news. Overall I do believe that she is making good progress is still gonna take some time before she is where I would like her to be from the standpoint of being able to place her back into  the total contact cast. Hopefully we will be where we need to be by next week. 12/21/18 on evaluation today patient actually appears to be doing poorly in regard to her toe ulcer. She's been tolerating the dressing changes without complication. Fortunately there's no signs of systemic infection although she does have a lot of drainage from the toe ulcer and this does seem to be causing some issues at this point. She does have erythema on the distal portion of her toe that appears to be likely cellulitis. 12/28/18 on evaluation today patient actually appears to be doing a little better in my pinion in regard to her toe ulcer. With that being said she still does have some evidence of infection at this time and for her culture she had both E. coli as well as enterococcus as organisms noted on evaluation. For that reason I think that though the Keflex likely has treated the E. coli rather well this has really done nothing for the enterococcus. We are going to have to initiate treatment for this specifically. [name], [name] ([id number]) 01/04/19 on evaluation today patient's toe actually appears to be doing better from the standpoint of infection. She currently would like to see about putting the cash back on I think that this is appropriate as long as she takes care of it and keeps it from getting wet. She is gonna have some drainage we can definitely pass this up with Drawtex and alginate to try to prevent as much drainage as possible from causing the problems. With that being said I do want to at least try her with the cast between now and Tuesday. If there any issues we can't continue to use it then I will discontinue the use of the cast at that point. 01/08/19 on evaluation today patient actually appears to be doing very well as far as her foot ulcer specifically the great toe on the left is concerned. She did have an area of rubbing on the  medial aspect of her left ankle which again is from the cast.  Fortunately there's no signs of infection at this point in this appears to be a very slight skin breakdown. The patient tells me she felt it rubbing but didn't think it was that bad. Fortunately there is no signs of active infection at this time which is good news. No fevers, chills, nausea, or vomiting noted at this time. 01/15/19 on evaluation today patient actually appears to be doing well in regard to her toe ulcer. Again as previous she seems to do well and she has the cast on which indicates to me that during the time she doesn't have a cast on she's putting way too much pressure on this region. Obviously I think that's gonna be an issue as with the current national emergency concerning the Covid-19 Virus it has been recommended that we discontinue the use of total contact casting by the chief medical officer of our company, Dr. Simona Huh. The reasoning is that if a patient becomes sick and cannot come into have the cast removed they could not just leave this on for an additional two weeks. Obviously the hospitals also do not want to receive patient's who are sick into the emergency department to potentially contaminate the region and spread the Covid-19 Virus among other sick individuals within the hospital system. Therefore at this point we are suspending the use of total contact cast until the current emergency subsides. This was all discussed with the patient today as well. 01/22/19 on evaluation today patient's wound on her left great toe appears to be doing slightly worse than previously noted last week. She tells me that she has been on this quite a bit in fact she tells me she's been awake for 38 straight hours. This is due to the fact that she's having to care for grandparents because nobody else will. She has been taking care of them for five the last seven days since I've seen her they both have dementia his is from a stroke and her grandmother's was progressive. Nonetheless she states even  her mom who knows her condition and situation has only help two of those days to take care of them she's been taking care of the rest. Fortunately there does not appear to be any signs of active infection in regard to her toe at this point although obviously it doesn't look as good as it did previous. I think this is directly related to her not taking off the pressure and friction by way of taking things easy. Though I completely understand what's going on. 01/29/19 on evaluation today patient's tools are actually appears to be showing some signs of improvement today compared to last week's evaluation as far as not necessarily the overall size of the wound but the fact that she has some new skin growth in between the two ends of the wound opening. Overall I feel like she has done well she states that she had a family member give her what sounds to be a CAM walker boot which has been helpful as well. 02/05/19 on evaluation today patient's wound bed actually appears to be doing significantly better in regard to her overall appearance of the size of the wound. With that being said she is still having an issue with offloading efficiently enough to get this to close. Apparently there is some signs of infection at this point as well unfortunately. Previously she's done well of Augmentin I really do not see anything that needs  to be culture currently but there theme and cellulitis of the foot that I'm seeing I'm gonna go ahead and place her on an antibiotic today to try to help clear this up. 02/12/2019 on evaluation today patient actually appears to be doing poorly in regard to her overall wound status. She tells me she has been using her offloading shoe but actually comes in today wearing her tennis shoe with the AFO brace. Again as I previously discussed with her this is really not sufficient to allow the area to heal appropriately. Nonetheless she continues to be somewhat noncompliant and I do wonder based  on what she has told my nurse in the past as to whether or not a good portion of this noncompliance may be recreational drug and alcohol related. She has had a history of heroin overdose and this was fairly recently in the past couple of months that have been seeing her. Nonetheless overall I feel like her wound looks significantly worse today compared to what it was previous. She still has significant erythema despite the Augmentin I am not sure that this is an appropriate medication for her infection I am also concerned that the infection may have gone down into her bone. 02/19/19 on evaluation today patient actually appears to be doing about the same in regard to her toe ulcer. Unfortunately she continues to show signs of bone exposure and infection at this point. There does not appear to be any evidence of worsening of the infection but I'm also not really sure that it's getting significantly better. She is on the Augmentin which should be sufficient for the Staphylococcus aureus infection that she has at this point. With that being said she may need IV antibiotics to more appropriately treat this. We did have a discussion today about hyperbaric option therapy. 02/28/19 on evaluation today patient actually appears to be doing much worse in regard to the wound on her left great toe as compared to even my previous evaluation last week. Unfortunately this seems to be training in a pretty poor direction. Her toe was actually now starting to angle laterally and I can actually see the entire joint area of the proximal portion of the digit where is the distal portion of the digit again is no longer even in contact with the joint line. Unfortunately there's a lot more necrotic tissue around the edge and the toe appears to be showing signs of becoming gangrenous in my pinion. I'm very concerned about were things stand at this point. She did see infectious disease and they are planning to send in a  prescription for Sivextro for her and apparently this has been approved. With that being said I don't think she should avoid taking this but at the same time I'm not sure that it's gonna be sufficient to save her toe at this point. 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 03/05/19 on evaluation today patient actually appears to be doing quite well in regard to her toe all things considering. She still has bone exposed but there appears to be much less your thing on overall the appearance of the wound and the toe itself is dramatically improved. She still does have some issues currently obviously with infection she did see vascular as well and there  concerned that her blood flow to the toad. For that reason they are setting up for an angiogram next week. 03/14/19 on evaluation today patient appears to be doing very poor in regard to her toe and specifically in regard to the ulceration and the fact that she's starting to notice the toe was leaning even more towards the lateral aspect and the complete joint is visible on the proximal aspect of the joint. Nonetheless she's also noted a significant odor and the tip of the toe is turning more dark and necrotic appearing. Overall I think she is getting worse not better as far as this is concerned. For that reason I am recommending at this point that she likely needs to be seen for likely amputation. READMISSION 03/19/2021 This is a patient that we cared for in this clinic for a prolonged period of time in 2019 and 2020 with a left foot and left first toe wound. 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 ([id number]) 04/09/20 by Dr.  Luana Shu. In December 2021 she had an ulcer on her right great toe as well as the fourth and fifth toes. 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 She also had an angiogram at that time and underwent angioplasty of the right anterior tibial artery. In any case she claims that the wound on the right foot is closed I did not look at this today which was probably an oversight although I think that should be done next week. After her surgery she developed a dehiscence but I do not see any follow-up of this. According to Dr. Deborra Medina last review that she was out of the area being cared for by another physician but recently came back to his attention. 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 A culture of this area showed E. coli apparently before she came back to see Dr. Luana Shu she was supposed to be receiving antibiotics but she did not really take them. Nor is she offloading this area at all. Finally her last hemoglobin A1c listed in epic was in March 2022 at 14.1 she says things are a lot better since then although I am not sure. 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 She had this wound at the time. During this admission she had E. coli sepsis a TEE was negative. 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 She is still a heavy cigarette smoker. She has a history of bipolar disorder seizure disorder ABI in our clinic was 1.05 6/1; left midfoot in the setting of a TMA done previously. Round circular wound with a "knuckle" of protruding tissue. The problem is that the knuckle was not attached to any of the surrounding  granulation and this probed proximally widely I removed a large portion of this tissue. This wound goes with considerable undermining laterally. I do not feel any bone there was no purulence but this is a deep wound. 6/8; in spite of the debridement I did last week. She arrives with a wound looking exactly the same. A protruding "knuckle" of tissue nonadherent to most of  the surrounding tissue. There is considerable depth around this from 6-12 o'clock at 2.7 cm and undermining of 1 cm. This does not look overtly infected and the x-ray I did last week was negative for any osseous abnormalities. We have been using silver collagen 6/15; deep tissue culture I did last week showed moderate staph aureus and moderate Pseudomonas. This will definitely require prolonged antibiotic therapy. The pathology on the protuberant area was negative for malignancy fungus etc. 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 We have been using silver collagen. I am going to be prescribing Levaquin for 2 weeks. Her CT scan of the foot is down for 7/5 6/22; CT scan of the foot on 7 5. She says she has hardware in the left leg from her previous fracture. She is on the Levaquin for the deep tissue culture I did that showed methicillin sensitive staph aureus and Pseudomonas. I gave her a 2-week supply and she will have another week. She arrives in clinic today with the same protuberant tissue however this is nonadherent to the tissue surrounding it. I am really at a loss to explain this unless there is underlying deep tissue infection 6/29; patient presents for 1 week follow-up. She has been using collagen to the wound bed. She reports taking her antibiotics as prescribed.She has no complaints or issues today. She denies signs of infection. 7/6; patient presents for one week followup. She has been using collagen to the wound bed. She states she is taking Levaquin however at  times she is not able to keep it down. She denies signs of infection. 7/13; patient presents for 1 week follow-up. She has been using silver alginate to the wound bed. She still has nausea when taking her antibiotics. She denies signs of infection. 7/20; patient presents for 1 week follow-up. She has been using silver alginate with gentamicin cream to the wound bed. She denies any issues and has no complaints today. She denies signs of infection. 7/27; patient presents for 1 week follow-up. She continues to use silver alginate with gentamicin cream to the wound bed. She reports starting her antibiotics. She has no issues or complaints. Overall she reports stability to the wound. 8/3; patient presents for 1 week follow-up. She has been using silver alginate with gentamicin cream to the wound bed. She reports completing all antibiotics. She has no issues or complaints today. She denies signs of infection. 8/17; patient presents for 2-week follow-up. He is to use silver alginate to the wound bed. She has no issues or complaints today. She denies signs of infection. She reports her pain has improved in her foot since last clinic visit 8/24; patient presents for 1 week follow-up. She continues to use silver alginate to the wound bed. She has no issues or complaints. She denies signs of infection. Pain is stable. 9/7; patient presents for follow-up. She missed her last week appointment due to feeling ill. She continues to use silver alginate. She has a new wound to the right lower extremity that is covered in eschar. She states It occurred over the past week and has no idea how it started. She currently denies signs of infection. 9/14; patient presents for follow-up. To the left foot wound she has been using gentamicin cream and silver alginate. To the right lower extremity wound she has been keeping this covered and has not obtain Santyl. 9/21; patient presents for follow-up. She reports using  gentamicin cream and silver alginate to  the left foot and Santyl to the right lower extremity wound. She has no issues or complaints today. She denies signs of infection. 9/28; patient presents for follow-up. She reports a new wound to her right heel. She states this occurred a few days ago and is progressively gotten worse. She has been trying to clean the area with a Q-tip and Santyl. She reports stability in the other 2 wounds. She has been using gentamicin cream and silver alginate to the left foot and Santyl to the right lower extremity wound. 10/12; patient presents for follow-up. She reports improvement to the wound beds. She is seeing vein and vascular to discuss the potential of a left BKA. She states they are going to do an arteriogram. She continues to use silver alginate with dressing changes to her wounds. 11/2; patient presents for follow-up. She states she has not been doing dressing changes to the wound beds. She states she is not able to offload the areas. She reports chronic pain to her left foot wound. 11/9; patient presents for follow-up. She came in with only socks on. She states she forgot to put on shoes. It is unclear if she is doing any dressing changes. She currently denies systemic signs of infection. 11/16; patient presents for follow-up. She came again only with socks on. She states she does not wear shoes ever. It is unclear if she does dressing changes. She currently denies systemic signs of infection. 11/23; patient presents for follow-up. She wore her shoes today. It still unclear exactly what dressing she is using for each wound but she did states she obtained Dakin's solution and has been using this to the left foot wound. She currently denies signs of infection. 11/30; patient presents for follow-up. She has no issues or complaints today. She currently denies signs of infection. 12/7; patient presents for follow-up. She has no issues or complaints today. She has  been using Hydrofera Blue to the right heel wound and Dakin solution to the left foot wound. Her right anterior leg wound is healed. She currently denies signs of infection. [name], [name] ([id number]) 12/14; patient presents for follow-up. She has been using Hydrofera Blue to the right heel and Dakin's to the left foot wounds. She has no issues or complaints today. She denies signs of infection. 12/21; patient presents for follow-up. She reports using Hydrofera Blue to the right heel and Dakin's to the left foot wound. She denies signs of infection. 12/28; patient presents for follow-up. She continues to use Dakin's to the left foot wound and Hydrofera Blue to the right heel wound. She denies signs of infection. 1/4; patient presents for follow-up. She has no issues or complaints today. She denies signs of infection. 1/11; patient presents for follow-up. It is unclear if she has been dressing these wounds over the past week. She currently denies signs of infection. 1/18; patient presents for follow-up. She states she has been using Dakin's wet-to-dry dressings to the left foot. She has been using Hydrofera Blue to the right foot foot wound. She states that the anterior right leg wound has reopened and draining serous fluid. She denies signs of infection. 1/25; patient presents for follow-up. She has no issues or complaints today. 2/1; patient presents for follow-up. She has no issues or complaints today. She denies signs of infection. 2/8; patient presents for follow-up. She has lost her surgical shoes. She did not have a dressing to the right heel wound. She currently denies signs of infection. 2/15; patient presents  for follow-up. She reports more pain to the right heel today. She denies purulent drainage Or fever/chills 2/22; patient presents for follow-up. She reports taking clindamycin over the past week. She states that she continues to have pain to her right heel. She reports purulent  drainage. Readmission 03/16/2022 Ms. Heidie Krall is a 46 year old female with a past medical history of type 2 diabetes, osteomyelitis to her feet, chronic systolic heart failure and bipolar disorder that presents to the clinic for bilateral feet wounds and right lower extremity wound. She was last seen in our clinic on 12/15/2021. At that time she had purulent drainage coming out of her right plantar foot and I recommended she go to the ED. She states she went to [facility] and has been there for the past 3 months. I cannot see the records. She states she had OR debridement and was on several weeks of IV antibiotics while inpatient. Since discharge she has not been taking care of the wound beds. She had nothing on her feet other than socks today. She currently denies signs of infection. 5/31; patient presents for follow-up. She has been using Dakin's wet-to-dry dressings to the wound beds on her feet bilaterally and antibiotic ointment to the right anterior leg wound. She had a wound culture done at last clinic visit that showed moderate Pseudomonas aeruginosa sensitive to ciprofloxacin. She currently denies systemic signs of infection. 6/14; patient presents for follow-up. She received Keystone 5 days ago and has been using this on the wound beds. She states that last week she had to go to the hospital because she had increased warmth and erythema to the right foot. She was started on 2 oral antibiotics. She states she has been taking these. She currently denies systemic signs of infection. She has no issues or complaints today. 6/21; patient presents for follow-up. She states she has been using Keystone antibiotics to the wound beds. She has no issues or complaints today. She denies signs of infection. 6/28; patient presents for follow-up. She has been using Keystone antibiotics to the wound beds. She has no issues or complaints today. 7/12; patient presents for follow-up. Has been  using Keystone antibiotics to the wound beds with calcium alginate. She has no issues or complaints today. She never followed up with her orthopedic surgeon who did the OR debridement to the right foot. We discussed the total contact cast for the left foot and patient would like to do this next week. 7/19; patient presents for follow-up. She has been using Keystone antibiotics with calcium alginate to the wound beds. She has no issues or complaints today. Patient is in agreement to do the total contact cast of the left foot today. She knows to return later this week for the obligatory cast change. 05-13-2022 upon evaluation today patient's wound which she has the cast of the left leg actually appears to be doing significantly better. Fortunately I do not see any signs of active infection locally or systemically which is great news and overall I am extremely pleased with where we stand currently. 7/26; patient presents for follow-up. She has a cast in place for the past week. She states it irritated her shin. Other than that she tolerated the cast well. She states she would like a break for 1 week from the cast. We have been using Keystone antibiotic and Aquacel to both wound beds. She denies signs of infection. 8/2; patient presents for follow-up. She has been using Keystone and Aquacel to the wound  beds. She denies any issues and has no complaints. She is agreeable to have the cast placed today for the left leg. 06-03-2022 upon evaluation today patient appears to be doing well with regard to her wound she saw some signs of improvement which is great news. Fortunately I do not see any evidence of active infection locally or systemically at this time which is great news. No fevers, chills, nausea, vomiting, or diarrhea. 8/16; patient presents for follow-up. She has no issues or complaints today. We have been using Keystone and Aquacel to the wound beds. The left lower extremity is in a total contact  cast. She is tolerated this well. 8/23; patient presents for follow-up. She has had the total contact cast on the left leg for the past week. Unfortunately this has rubbed and broken down the skin to the medial foot. She currently denies signs of infection. She has been using Keystone antibiotic to the right plantar foot wound. TASHAWNA, THOM A. ([id number]) 8/30; patient presents for follow-up. We have held off on the total contact cast for the left leg for the past week. Her wound on the left foot has improved and the previous surrounding breakdown of skin has epithelialized. She has been using Keystone antibiotic to both wound beds. She has no issues or complaints today. She denies signs of infection. 9/6; patient presents for follow-up. She has ordered her's Keystone antibiotic and this is arriving this week. She has been doing Dakin's wet-to- dry dressings to the wound beds. She denies signs of infection. She is agreeable to the total contact cast today. 9/13; patient presents for follow-up. She states that the cast caused her left leg shin to be sore. She would like to take a break from the cast this week. She has been using Keystone antibiotic to the right plantar foot wound. She denies signs of infection. 9/20; patient presents for follow-up. She has been using Keystone antibiotics to the wound beds with calcium alginate to the right foot wound and Hydrofera Blue to the left foot wound. She is agreeable to having the cast placed today. She has been approved for Apligraf and we will order this for next clinic visit. 9/27; patient presents for follow-up. We have been using Keystone antibiotic with Hydrofera Blue to the left foot wound under a total contact cast. To the right foot wound she has been using Keystone antibiotic and calcium alginate. She declines a total contact cast today. Apligraf is available for placement and she would like to proceed with this. Electronic Signature(s) Signed:  07/20/2022 1:40:43 PM By: Kalman Shan DO Entered By: Kalman Shan on 07/20/2022 12:22:14 [name] ([id number]) -------------------------------------------------------------------------------- Physical Exam Details Patient Name: Heather Dear A. Date of Service: 07/20/2022 9:45 AM Medical Record Number: [id number] Patient Account Number: [id number] Date of Birth/Sex: 11/24/1975 (46 y.o. F) Treating RN: Cornell Barman Primary Care Provider: Raelene Bott Other Clinician: Massie Kluver Referring Provider: Raelene Bott Treating Provider/Extender: Yaakov Guthrie in Treatment: 18 Constitutional . Cardiovascular . Psychiatric . Notes Right foot: To the plantar heel there is an incision site with increased depth. Nonviable tissue at the surface, callus present. No probing to bone. Left foot: To the medial aspect there is an open wound with granulation tissue and nonviable tissue with surrounding callus. Again no signs of infection. Electronic Signature(s) Signed: 07/20/2022 1:40:43 PM By: Kalman Shan DO Entered By: Kalman Shan on 07/20/2022 12:22:42 [name] ([id number]) -------------------------------------------------------------------------------- Physician Orders Details Patient Name: Heather Dear A. Date of Service: 07/20/2022  9:45 AM Medical Record Number: [id number] Patient Account Number: [id number] Date of Birth/Sex: [birth year] (46 y.o. F) Treating RN: Cornell Barman Primary Care Provider: Raelene Bott Other Clinician: Massie Kluver Referring Provider: Raelene Bott Treating Provider/Extender: Yaakov Guthrie in Treatment: 70 Verbal / Phone Orders: No Diagnosis Coding Follow-up Appointments o Return Appointment in 1 week. o Nurse Visit as needed Bathing/ Shower/ Hygiene o May shower with wound dressing protected with water repellent cover or cast protector. o No tub bath. Anesthetic (Use 'Patient Medications'  Section for Anesthetic Order Entry) o Lidocaine applied to wound bed Cellular or Tissue Based Products o Cellular or Tissue Based Product Type: - apligraf #1 applied to left foot Edema Control - Lymphedema / Segmental Compressive Device / Other o Elevate, Exercise Daily and Avoid Standing for Long Periods of Time. o Elevate legs to the level of the heart and pump ankles as often as possible o Elevate leg(s) parallel to the floor when sitting. Off-Loading o Total Contact Cast to Left Lower Extremity - contact cast #7 applied Don't get cast wet o Other: - keep pressure off of feet. Medications-Please add to medication list. o Keystone Compound - not available to apply this visit 07/20/22 Keystone gel right foot Wound Treatment Wound #11 - Foot Wound Laterality: Plantar, Right Cleanser: Soap and Water 1 x Per Day/30 Days Discharge Instructions: Gently cleanse wound with antibacterial soap, rinse and pat dry prior to dressing wounds Topical: keystone gel 1 x Per Day/30 Days Primary Dressing: Aquacel Extra Hydrofiber Dressing, 2x2 (in/in) 1 x Per Day/30 Days Secondary Dressing: ABD Pad 5x9 (in/in) (Generic) 1 x Per Day/30 Days Discharge Instructions: Cover with ABD pad Secured With: Medipore Tape - 641M Medipore H Soft Cloth Surgical Tape, 2x2 (in/yd) (Generic) 1 x Per Day/30 Days Secured With: Kerlix Roll Sterile or Non-Sterile 6-ply 4.5x4 (yd/yd) (Generic) 1 x Per Day/30 Days Discharge Instructions: Apply Kerlix as directed Add-Ons: total contact cast 1 x Per Day/30 Days Wound #12 - Foot Wound Laterality: Plantar, Left, Medial Cleanser: Soap and Water 1 x Per Day/30 Days Discharge Instructions: Gently cleanse wound with antibacterial soap, rinse and pat dry prior to dressing wounds Topical: Vitamin AandD Ointment, 4(oz) tube 1 x Per Day/30 Days Discharge Instructions: apply to red, broken down area Primary Dressing: Apligraf 1 x Per Day/30 Days Discharge Instructions: #1  applied today Mullings, [name] ([id number]) Secondary Dressing: ABD Pad 5x9 (in/in) (Generic) 1 x Per Day/30 Days Discharge Instructions: Cover with ABD pad Secondary Dressing: Gauze 1 x Per Day/30 Days Discharge Instructions: As directed: dry, moistened with saline or moistened with Dakins Solution Secured With: Medipore Tape - 641M Medipore H Soft Cloth Surgical Tape, 2x2 (in/yd) (Generic) 1 x Per Day/30 Days Secured With: Kerlix Roll Sterile or Non-Sterile 6-ply 4.5x4 (yd/yd) (Generic) 1 x Per Day/30 Days Discharge Instructions: Apply Kerlix as directed Add-Ons: total contact cast 1 x Per Day/30 Days Electronic Signature(s) Signed: 07/20/2022 4:02:46 PM By: Kalman Shan DO Previous Signature: 07/20/2022 1:45:58 PM Version By: Kalman Shan DO Previous Signature: 07/20/2022 1:40:43 PM Version By: Kalman Shan DO Entered By: Kalman Shan on 07/20/2022 16:02:28 Reimers, Jackelynn A. ([id number]) -------------------------------------------------------------------------------- Problem List Details Patient Name: Heather Dear A. Date of Service: 07/20/2022 9:45 AM Medical Record Number: [id number] Patient Account Number: [id number] Date of Birth/Sex: 31-May-1976 (46 y.o. 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 ([id number]) -------------------------------------------------------------------------------- Progress Note Details Patient Name: Heather Dear A. Date of Service: 07/20/2022 9:45 AM Medical Record Number: [id number] Patient Account Number: [id number] Date of Birth/Sex: 1976-01-21 (46 y.o. F) Treating RN: Cornell Barman Primary Care Provider: Raelene Bott Other Clinician: Massie Kluver Referring Provider: Raelene Bott Treating Provider/Extender: Yaakov Guthrie in Treatment: 18 Subjective Chief Complaint Information obtained from Patient 03/19/2021; patient referred by Dr. Luana Shu who has been looking after her left foot for quite a period of time for review of a nonhealing area in the left midfoot 03/12/2022; bilateral feet wounds and right lower extremity wound. History of Present Illness (HPI) 01/18/18-She is here for initial evaluation of the left great toe ulcer. She is a poor historian in regards to timeframe in detail. She states approximately 4 weeks ago she lacerated her toe on something in the house. She followed up with her primary care who placed her on Bactrim and ultimately a second dose of Bactrim prior to coming to wound clinic. She states she has been treating the toe with peroxide, Betadine and a Band-Aid. She did not check her blood sugar this morning but checked it yesterday morning it was 327; she is unaware of a recent A1c and there are no current records. She saw Dr. she would've orthopedics last week for an old injury to the left ankle, she states he did not see her toe, nor did she bring it to his attention. She smokes approximately 1 pack cigarettes a day. Her social situation is concerning, she arrives this morning with her mother who appears extremely  intoxicated/under the influence; her mother was asked to leave the room and be monitored by the patient's grandmother. The patient's aunt then accompanied the patient and the room throughout the rest of the appointment. We had a lengthy discussion regarding the deleterious effects of uncontrolled hyperglycemia and smoking as it relates to wound healing and overall health. She was strongly encouraged to decrease her smoking and get her diabetes under better control. She states she is currently on a diet and has cut down her [employer] [facility] [employer] consumption. The left toe is erythematous, macerated and slightly edematous with malodor present. The edema in her left foot is below her baseline, there is no erythema streaking. We will treat her with Santyl, doxycycline; we have ordered and xray, culture and provided a Peg assist surgical shoe and cultured the wound. 01/25/18-She is here in follow-up evaluation for a left great toe ulcer and presents with an abscess to her suprapubic area. She states her blood sugars remain elevated, feeling "sick" and if levels are below 250, but she is trying. She has made no attempt to decrease her smoking stating that we "can't take away her food in her cigarettes". She has been compliant with offloading using the PEG assist you. She is using Santyl daily. the culture obtained last week grew staph aureus and Enterococcus faecalis; continues  on the doxycycline and Augmentin was added on Monday. The suprapubic area has erythema, no femoral variation, purple discoloration, minimal induration, was accessed with a cotton tip applicator with sanguinopurulent drainage, this was cultured, I suspect the current antibiotic treatment will cover and we will not add anything to her current treatment plan. She was advised to go to urgent care or ER with any change in redness, induration or fever. 02/01/18-She is here in follow-up evaluation for left great toe ulcers and a new abdominal abscess  from last week. She was able to use packing until earlier this week, where she "forgot it was there". She states she was feeling ill with GI symptoms last week and was not taking her antibiotic. She states her glucose levels have been predominantly less than 200, with occasional levels between 200-250. She thinks this was contributing to her GI symptoms as they have resolved without intervention. There continues to be significant laceration to left toe, otherwise it clinically looks stable/improved. There is now less superficial opening to the lateral aspect of the great toe that was residual blister. We will transition to [facility] to all wounds, she will continue her Augmentin. If there is no change or deterioration next week for reculture. 02/08/18-She is here in follow-up evaluation for left great toe ulcer and abdominal ulcer. There is an improvement in both wounds. She has been wrapping her left toe with coban, not by our direction, which has created an area of discoloration to the medial aspect; she has been advised to NOT use coban secondary to her neuropathy. She states her glucose levels have been high over this last week ranging from 200-350, she continues to smoke. She admits to being less compliant with her offloading shoe. We will continue with same treatment plan and she will follow-up next week. 02/15/18-She is here in follow-up evaluation for left great toe ulcer and abdominal ulcer. The abdominal ulcer is epithelialized. The left great toe ulcer is improved and all injury from last week using the Coban wrap is resolved, the lateral ulcer is healed. She admits to noncompliance with wearing offloading shoe and admits to glucose levels being greater than 300 most of the week. She continues to smoke and expresses no desire to quit. There is one area medially that probes deeper than it has historically, erythema to the toe and dorsal foot has consistently waxed and waned. There is no  overt signs of cellulitis or infection but we will culture the wound for any occult infection given the new area of depth and erythema. We will hold off on sensitivities for initiation of antibiotic therapy. 02/22/18-She is here in follow up evaluation for left great toe ulcer. There is overall significant improvement in both wound appearance, erythema and edema with changes made last week. She was not initiated on antibiotic therapy. Culture obtained last week showed oxacillin sensitive staph aureus, sensitive to clindamycin. Clindamycin has been called into the pharmacy but she has been instructed to hold off on initiation secondary to overall clinical improvement and her history of antibiotic intolerance. She has been instructed to contact the clinic with any noted changes/deterioration and the wound, erythema, edema and/or pain. She will follow-up next week. She continues to smoke and her glucose levels remain elevated >250; she admits to compliance with offloading shoe 03/01/18 on evaluation today patient appears to be doing fairly well in regard to her left first toe ulcer. She has been tolerating the dressing changes with the [facility] Dressing without complication and  overall this has definitely showed signs of improvement according to records as well is what the patient tells me today. I'm very pleased in that regard. She is having no pain today 03/08/18 She is here for follow up evaluation of a left great toe ulcer. She remains non-compliant with glucose control and smoking cessation; glucose levels consistently >200. She states that she got new shoe inserts/peg assist. She admits to compliance with offloading. Since my last evaluation there is significant improvement. We will switch to prisma at this time and she will follow up next week. She is noted to be tachycardic at this appointment, heart rate 120s; she has a history of heart rate 70-130 according to our records. She admits to extreme  agitation r/t personal issues; she was advised to monitor her heartrate and contact her physician if it does not return to a more normal range (<100). She takes cardizem twice daily. 03/15/18-She is here in follow-up evaluation for left great toe ulcer. She remains noncompliant with glucose control and smoking cessation. She admits to compliance with wearing offloading shoe. The ulcer is improved/stable and we will continue with the same treatment plan and she will follow-up next week 03/22/18-She is here for evaluation for left great toe ulcer. There continues to be significant improvement despite recurrent hyperglycemia (over 500 yesterday) and she continues to smoke. She has been compliant with offloading and we will continue with same treatment plan and she will follow-up next week. Vanhorn, Kjerstin A. ([id number]) 03/29/18-She is here for evaluation for left great toe ulcer. Despite continuing to smoke and uncontrolled diabetes she continues to improve. She is compliant with offloading shoe. We will continue with the same treatment plan and she will follow-up next week 04/05/18- She is here in follow up evaluation for a left great toe ulcer; she presents with small pustule to left fifth toe (resembles ant bite). She admits to compliance with wearing offloading shoe; continues to smoke or have uncontrolled blood glucose control. There is more callus than usual with evidence of bleeding; she denies known trauma. 04/12/18-She is here for evaluation of left great toe ulcer. Despite noncompliance with glycemic control and smoking she continues to make improvement. She continues to wear offloading shoe. The pustule, that was identified last week, to the left fifth toe is resolved. She will follow-up in 2 weeks 05/03/18-she is seen in follow-up evaluation for a left great toe ulcer. She is compliant with offloading, otherwise noncompliant with glycemic control and smoking. She has plateaued and there is  minimal improvement noted. We will transition to [facility], replaced the insert to her surgical shoe and she will follow-up in one week 05/10/18- She is here in follow up evaluation for a left great toe ulcer. It appears stable despite measurement change. We will continue with same treatment plan and follow up next week. 05/24/18-She is seen in follow-up evaluation for a left great toe ulcer. She remains compliant with offloading, has made significant improvement in her diet, decreasing the amount of sugar/soda. She said her recent A1c was 10.9 which is lower than. She did see a diabetic nutritionist/educator yesterday. She continues to smoke. We will continue with the same treatment plan and she'll follow-up next week. 05/31/18- She is seen in follow-up evaluation for left great toe ulcer. She continues to remain compliant with offloading, continues to make improvement in her diet, increasing her water and decreasing the amount of sugar/soda. She does continue to smoke with no desire to quit. We will apply Prisma  to the depth and Hydrofera Blue over. We have not received insurance authorization for oasis. She will follow up next week. 06/07/18-She is seen in follow-up evaluation for left great toe ulcer. It has stalled according to today's measurements although base appears stable. She says she saw a diabetic educator yesterday; her average blood sugars are less than 300 which is an improvement for her. She continues to smoke and states "that's my next step" She continues with water over soda. We will order for xray, culture and reinstate ace wrap compression prior to placing apligraf for next week. She is voicing no complaints or concerns. Her dressing will change to iodoflex over the next week in preparation for apligraf. 06/14/18-She is seen in follow-up evaluation for left great toe ulcer. Plain film x-ray performed last week was negative for osteomyelitis. Wound culture obtained last week grew  strep B and OSSA; she is initiated on keflex and cefdinir today; there is erythema to the toe which could be from ace wrap compression, she has a history of wrapping too tight and has has been encouraged to maintain ace wraps that we place today. We will hold off on application of apligraf today, will apply next week after antibiotic therapy has been initiated. She admits today that she has resumed taking a shower with her foot/toe submerged in water, she has been reminded to keep foot/toe out of the bath water. She will be seen in follow up next week 06/21/18-she is seen in follow-up evaluation for left great toe ulcer. She is tolerating antibiotic therapy with no GI disturbance. The wound is stable. Apligraf was applied today. She has been decreasing her smoking, only had 4 cigarettes yesterday and 1 today. She continues being more compliant in diabetic diet. She will follow-up next week for evaluation of site, if stable will remove at 2 weeks. 06/28/18- She is here in follow up evalution. Apligraf was placed last week, she states the dressing fell off on Tuesday and she was dressing with hydrofera blue. She is healed and will be discharged from the clinic today. She has been instructed to continue with smoking cessation, continue monitoring glucose levels, offloading for an additional 4 weeks and continue with hydrofera blue for additional two weeks for any possible microscopic opening. Readmission: 08/07/18 on evaluation today patient presents for reevaluation concerning the ulcer of her right great toe. She was previously discharged on 06/28/18 healed. Nonetheless she states that this began to show signs of drainage she subsequently went to her primary care provider. Subsequently an x-ray was performed on 08/01/18 which was negative. The patient was also placed on antibiotics at that time. Fortunately they should have been effective for the infection. Nonetheless she's been experiencing some  improvement but still has a lot of drainage coming from the wound itself. 08/14/18 on evaluation today patient's wound actually does show signs of improvement in regard to the erythema at this point. She has completed the antibiotics. With that being said we did discuss the possibility of placing her in a total contact cast as of today although I think that I may want to give this just a little bit more time to ensure nothing recurrence as far as her infection is concerned. I do not want to put in the cast and risk infection at that time if things are not completely resolved. With that being said she is gonna require some debridement today. 08/21/18 on evaluation today patient actually appears to be doing okay in regard to her toe ulcer.  She's been tolerating the dressing changes without complication. With that being said it does appear that she is ready and in fact I think it's appropriate for [place] to go ahead and initiate the total contact cast today. Nonetheless she will require some sharp debridement to prepare the wound for application. Overall I feel like things have been progressing well but we do need to do something to get this to close more readily. 08/24/18 patient seen today for reevaluation after having had the total contact cast applied on Tuesday. She seems to have done very well the wound appears to be doing great and overall I'm pleased with the progress that she's made. There were no abnormal areas of rubbing from the cast on her lower extremity. 08/30/18 on evaluation today patient actually appears to be completely healed in regard to her plantar toe ulcer. She tells me at this point she's been having a lot of issues with the cast. She almost fell a couple of times the state shall the step of her dog a couple times as well. This is been a very frustrating process for her other nonetheless she has completely healed the wound which is excellent news. Overall there does not appear to be  the evidence of infection at this time which is great news. 09/11/18 evaluation today patient presents for follow-up concerning her great toe ulcer on the left which has unfortunately reopened since I last saw her which was only a couple of weeks ago. Unfortunately she was not able to get in to get the shoe and potentially the AFO that's gonna be necessary due to her left foot drop. She continues with offloading shoe but this is not enough to prevent her from reopening it appears. When we last had her in the total contact cast she did well from a healing standpoint but unfortunately the wound reopened as soon as she came out of the cast within just a couple of weeks. 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 This is also part of the reason why she falls on a regular basis. I just do not believe that is gonna be safe for [place] to reinitiate the total contact cast as last time we had this on she fell 3 times one week which is definitely not normal for her. 09/18/18 upon evaluation today the patient actually appears to be doing about the same in regard to her toe ulcer. She did not contact [facility] as I asked her to even though I had given her the prescription. In fact she actually states that she has no idea where the prescription is. She did apparently call [facility] and they told her that all she needed to do was bring the prescription in order to be able to be seen and work on getting the AFO for her left foot. With all that being said she still does not have an appointment and I'm not sure were things stand that regard. I will give her a new prescription today in order to contact them to get this set up. ROSHANNA, CIMINO A. ([id number]) 09/25/18 on evaluation today patient actually appears to be doing about the  same in regard to her toes ulcer. She does have a small areas which seems to have a lot of callous buildup around  the edge of the wound which is going to need sharp debridement today. She still is waiting to be scheduled for evaluation with [facility] for possibility of an AFO. She states there supposed to call her tomorrow to get this set up. Unfortunately it does appear that her foot specifically the toe area is showing signs of erythema. There does not appear to be any systemic infection which is in these good news. 10/02/18 on evaluation today patient actually appears to be doing about the same in regard to her toe ulcer. This really has not done too well although it's not significantly larger it's also not significantly smaller. She has been tolerating the dressing changes without complication. She actually has her appointment with [facility] and [place] tomorrow to hopefully be measured for obtaining and AFO splint. I think this would be helpful preventing this from reoccurring. We had contemplated starting the cast this week although to be honest I am reluctant to do that as she's been having nausea, vomiting, and seizure activity over the past three days. She has a history of seizures and have been told is nothing that can be done for these. With that being said I do believe that along with the seizures have the nausea vomiting which upon further questioning doesn't seem to be the normal for her and makes me concerned for the possibility of infection or something else going on. I discussed this with the patient and her mother during the office visit today. I do not feel the wound is effective but maybe something else. The responses this was "this just happens to her at times and we don't know why". They did not seem to be interested in going to the hospital to have this checked out further. 10/09/18 on evaluation today patient presents for follow-up concerning her ongoing toe ulcer. She has been  tolerating the dressing changes without complication. Fortunately there does not appear to be any evidence of infection which is great news however I do think that the patient would benefit from going ahead for with the total contact cast. She's actually in a wheelchair today she tells me that she will use her walker if we initiate the cast. I was very specific about the fact that if we were gonna do the cast I wanted to make sure that she was using the walker in order to prevent any falls. She tells me she does not have stairs that she has to traverse on a regular basis at her home. She has not had any seizures since last week again that something that happens to her often she tells me she did talk to [employer] and they said that it may take up to three weeks to get the brace approved for her. Hopefully that will not take that long but nonetheless in the meantime I do think the cast could be of benefit. 10/12/18 on evaluation today patient appears to be doing rather well in regard to her toe ulcer. It's just been a few days and already this is significantly improved both as far as overall appearance and size. Fortunately there's no sign of infection. She is here for her first obligatory cast change. 10/19/18 Seen today for follow up and management of left great toe ulcer. Wound continues to show improvement. Noted small open area with seroussang drainage with palpation. Denies any increased pain or recent fevers during visit. She will continue calcium alginate with offloading shoe. Denies any questions or concerns during visit. 10/26/18 on evaluation today patient appears  to be doing about the same as when I last saw her in regard to her wound bed. Fortunately there does not appear to be any signs of infection. Unfortunately she continues to have a breakdown in regard to the toe region any time that she is not in the cast. It takes almost no time at all for this to happen. Nonetheless she still has not  heard anything from the brace being made by [employer] as to when exactly this will be available to her. Fortunately there is no signs of infection at this time. 10/30/18 on evaluation today patient presents for application of the total contact cast as we just received him this morning. Fortunately we are gonna be able to apply this to her today which is great news. She continues to have no significant pain which is good news. Overall I do feel like things have been improving while she was the cast is when she doesn't have a cast that things get worse. She still has not really heard anything from [place] regarding her brace. 11/02/18 upon evaluation today patient's wound already appears to be doing significantly better which is good news. Fortunately there does not appear to be any signs of infection also good news. Overall I do think the total contact cast as before is helping to heal this area unfortunately it's just not gonna likely keep the area closed and healed without her getting her brace at least. Again the foot drop is a significant issue for her. 11/09/18 on evaluation today patient appears to be doing excellent in regard to her toe ulcer which in fact is completely healed. Fortunately we finally got the situation squared away with the paperwork which was needed to proceed with getting her brace approved by Medicaid. I have filled that out unfortunately that information has been sent to the orthopedic office that I worked at 2 1/2 years ago and not tired Current wound care measures. Fortunately she seems to be doing very well at this time. 11/23/18 on evaluation today patient appears to be doing More Poorly Compared to Last Time I Saw Her. At [facility] She Had Completely Healed. Currently she is continuing to have issues with reopening. She states that she just found out that the brace was approved through Medicaid now she just has to go get measured in order to have this fitted for her and then  made. Subsequently she does not have an appointment for this yet that is going to complicate things we obviously cannot put her back in the cast if we do not have everything measured because they're not gonna be able to measure her foot while she is in the cast. 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 Obviously this is unfortunate and does have me somewhat worried as well. 11/30/18 on evaluation today patient's toe ulcer actually appears to be doing fairly well. The good news is she will be getting her brace in the shoes next week on Wednesday. Hopefully we will be able to get this to heal without having to go back in the cast however she may need the cast in order to get the wound completely heal and then go from there. Fortunately there's no signs of infection at this time. 12/07/18 on evaluation today patient fortunately did receive her brace and she states she could tell this definitely makes her walk better. With that being  said she's been having issues with her toe where she noticed yesterday there was a lot of tissue that was loosing off this appears to be much larger than what it was previous. She also states that her leg has been read putting much across the top of her foot just about the ankle although this seems to be receiving somewhat. The total area is still red and appears to be someone infected as best I can tell. She is previously taken Bactrim and that may be a good option for her today as well. We are gonna see what I wound culture shows as well and I think that this is definitely appropriate. With that being said outside of the culture I still need to initiate something in the interim and that's what I'm gonna go ahead and select Bactrim is a good option for her. 12/14/18 on evaluation today patient appears to be doing better in regard to her left great toe ulcer as compared to last  week's evaluation. There's still some erythema although this is significantly improved which is excellent news. Overall I do believe that she is making good progress is still gonna take some time before she is where I would like her to be from the standpoint of being able to place her back into the total contact cast. Hopefully we will be where we need to be by next week. 12/21/18 on evaluation today patient actually appears to be doing poorly in regard to her toe ulcer. She's been tolerating the dressing changes without complication. Fortunately there's no signs of systemic infection although she does have a lot of drainage from the toe ulcer and this does Bedrosian, Andelyn A. ([id number]) seem to be causing some issues at this point. She does have erythema on the distal portion of her toe that appears to be likely cellulitis. 12/28/18 on evaluation today patient actually appears to be doing a little better in my pinion in regard to her toe ulcer. With that being said she still does have some evidence of infection at this time and for her culture she had both E. coli as well as enterococcus as organisms noted on evaluation. For that reason I think that though the Keflex likely has treated the E. coli rather well this has really done nothing for the enterococcus. We are going to have to initiate treatment for this specifically. 01/04/19 on evaluation today patient's toe actually appears to be doing better from the standpoint of infection. She currently would like to see about putting the cash back on I think that this is appropriate as long as she takes care of it and keeps it from getting wet. She is gonna have some drainage we can definitely pass this up with Drawtex and alginate to try to prevent as much drainage as possible from causing the problems. With that being said I do want to at least try her with the cast between now and Tuesday. If there any issues we can't continue to use it then I will  discontinue the use of the cast at that point. 01/08/19 on evaluation today patient actually appears to be doing very well as far as her foot ulcer specifically the great toe on the left is concerned. She did have an area of rubbing on the medial aspect of her left ankle which again is from the cast. Fortunately there's no signs of infection at this point in this appears to be a very slight skin breakdown. The patient tells me she felt it  rubbing but didn't think it was that bad. Fortunately there is no signs of active infection at this time which is good news. No fevers, chills, nausea, or vomiting noted at this time. 01/15/19 on evaluation today patient actually appears to be doing well in regard to her toe ulcer. Again as previous she seems to do well and she has the cast on which indicates to me that during the time she doesn't have a cast on she's putting way too much pressure on this region. Obviously I think that's gonna be an issue as with the current national emergency concerning the Covid-19 Virus it has been recommended that we discontinue the use of total contact casting by the chief medical officer of our company, Dr. Simona Huh. The reasoning is that if a patient becomes sick and cannot come into have the cast removed they could not just leave this on for an additional two weeks. Obviously the hospitals also do not want to receive patient's who are sick into the emergency department to potentially contaminate the region and spread the Covid-19 Virus among other sick individuals within the hospital system. Therefore at this point we are suspending the use of total contact cast until the current emergency subsides. This was all discussed with the patient today as well. 01/22/19 on evaluation today patient's wound on her left great toe appears to be doing slightly worse than previously noted last week. She tells me that she has been on this quite a bit in fact she tells me she's been awake for 38  straight hours. This is due to the fact that she's having to care for grandparents because nobody else will. She has been taking care of them for five the last seven days since I've seen her they both have dementia his is from a stroke and her grandmother's was progressive. Nonetheless she states even her mom who knows her condition and situation has only help two of those days to take care of them she's been taking care of the rest. Fortunately there does not appear to be any signs of active infection in regard to her toe at this point although obviously it doesn't look as good as it did previous. I think this is directly related to her not taking off the pressure and friction by way of taking things easy. Though I completely understand what's going on. 01/29/19 on evaluation today patient's tools are actually appears to be showing some signs of improvement today compared to last week's evaluation as far as not necessarily the overall size of the wound but the fact that she has some new skin growth in between the two ends of the wound opening. Overall I feel like she has done well she states that she had a family member give her what sounds to be a CAM walker boot which has been helpful as well. 02/05/19 on evaluation today patient's wound bed actually appears to be doing significantly better in regard to her overall appearance of the size of the wound. With that being said she is still having an issue with offloading efficiently enough to get this to close. Apparently there is some signs of infection at this point as well unfortunately. Previously she's done well of Augmentin I really do not see anything that needs to be culture currently but there theme and cellulitis of the foot that I'm seeing I'm gonna go ahead and place her on an antibiotic today to try to help clear this up. 02/12/2019 on evaluation today patient actually  appears to be doing poorly in regard to her overall wound status. She tells  me she has been using her offloading shoe but actually comes in today wearing her tennis shoe with the AFO brace. Again as I previously discussed with her this is really not sufficient to allow the area to heal appropriately. Nonetheless she continues to be somewhat noncompliant and I do wonder based on what she has told my nurse in the past as to whether or not a good portion of this noncompliance may be recreational drug and alcohol related. She has had a history of heroin overdose and this was fairly recently in the past couple of months that have been seeing her. Nonetheless overall I feel like her wound looks significantly worse today compared to what it was previous. She still has significant erythema despite the Augmentin I am not sure that this is an appropriate medication for her infection I am also concerned that the infection may have gone down into her bone. 02/19/19 on evaluation today patient actually appears to be doing about the same in regard to her toe ulcer. Unfortunately she continues to show signs of bone exposure and infection at this point. There does not appear to be any evidence of worsening of the infection but I'm also not really sure that it's getting significantly better. She is on the Augmentin which should be sufficient for the Staphylococcus aureus infection that she has at this point. With that being said she may need IV antibiotics to more appropriately treat this. We did have a discussion today about hyperbaric option therapy. 02/28/19 on evaluation today patient actually appears to be doing much worse in regard to the wound on her left great toe as compared to even my previous evaluation last week. Unfortunately this seems to be training in a pretty poor direction. Her toe was actually now starting to angle laterally and I can actually see the entire joint area of the proximal portion of the digit where is the distal portion of the digit again is no longer even in  contact with the joint line. Unfortunately there's a lot more necrotic tissue around the edge and the toe appears to be showing signs of becoming gangrenous in my pinion. I'm very concerned about were things stand at this point. She did see infectious disease and they are planning to send in a prescription for Sivextro for her and apparently this has been approved. With that being said I don't think she should avoid taking this but at the same time I'm not sure that it's gonna be sufficient to save her toe at this point. 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 03/05/19 on evaluation today patient actually appears to be doing quite well in regard to her toe all things considering. She still has bone exposed but there appears to be much less your thing on overall the appearance of the wound and the toe itself is dramatically improved. She still does have some issues currently obviously with infection she did see vascular as well and there concerned that her blood flow to the toad. For that reason they are setting up for an angiogram next week. 03/14/19 on evaluation today patient appears to be doing very poor in regard to her toe and specifically in  regard to the ulceration and the fact that she's starting to notice the toe was leaning even more towards the lateral aspect and the complete joint is visible on the proximal aspect of the joint. Nonetheless she's also noted a significant odor and the tip of the toe is turning more dark and necrotic appearing. Overall I think she is getting worse not better as far as this is concerned. For that reason I am recommending at this point that she likely needs to be seen for likely amputation. ARIANIE, [name] ([id number]) READMISSION 03/19/2021 This is a  patient that we cared for in this clinic for a prolonged period of time in 2019 and 2020 with a left foot and left first toe wound. 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 Luana Shu. In December 2021 she had an ulcer on her right great toe as well as the fourth and fifth toes. 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 She also had an angiogram at that time and underwent angioplasty of the right anterior tibial artery. In any case she claims that the wound on the right foot is closed I did not look at this today which was probably an oversight although I think that should be done next week. After her surgery she developed a dehiscence but I do not see any follow-up of this. According to Dr. Deborra Medina last review that she was out of the area being cared for by another physician but recently came back to his attention. 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 A culture of this area showed E. coli apparently before she came back to see Dr. Luana Shu she was supposed to be receiving antibiotics but she did not really take them. Nor is she offloading this area at all. Finally her last hemoglobin A1c listed in epic was in March 2022 at 14.1 she says things are a lot better since then although I am not sure. 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 She had this wound at the time. During this admission she had E. coli sepsis a TEE was negative. 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 She is still a heavy  cigarette smoker. She has a history of bipolar disorder seizure disorder ABI in our clinic was 1.05 6/1; left midfoot in the setting of a TMA done previously. Round circular wound with a "knuckle" of protruding tissue. The problem is that the knuckle was not attached to any of the surrounding granulation and this probed proximally widely I removed a large portion of this tissue. This wound goes with considerable undermining laterally. I do not feel any bone there was no purulence but this is a deep wound. 6/8; in spite of the debridement I did last week. She arrives with a wound looking exactly the same. A protruding "knuckle" of tissue nonadherent to most of the surrounding tissue. There is considerable depth around this from 6-12 o'clock at 2.7 cm and undermining of 1 cm. This does not look overtly infected and the x-ray I did last week was negative for any osseous abnormalities.  We have been using silver collagen 6/15; deep tissue culture I did last week showed moderate staph aureus and moderate Pseudomonas. This will definitely require prolonged antibiotic therapy. The pathology on the protuberant area was negative for malignancy fungus etc. 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 We have been using silver collagen. I am going to be prescribing Levaquin for 2 weeks. Her CT scan of the foot is down for 7/5 6/22; CT scan of the foot on 7 5. She says she has hardware in the left leg from her previous fracture. She is on the Levaquin for the deep tissue culture I did that showed methicillin sensitive staph aureus and Pseudomonas. I gave her a 2-week supply and she will have another week. She arrives in clinic today with the same protuberant tissue however this is nonadherent to the tissue surrounding it. I am really at a loss to explain this unless there is underlying deep tissue infection 6/29; patient presents for 1 week follow-up. She  has been using collagen to the wound bed. She reports taking her antibiotics as prescribed.She has no complaints or issues today. She denies signs of infection. 7/6; patient presents for one week followup. She has been using collagen to the wound bed. She states she is taking Levaquin however at times she is not able to keep it down. She denies signs of infection. 7/13; patient presents for 1 week follow-up. She has been using silver alginate to the wound bed. She still has nausea when taking her antibiotics. She denies signs of infection. 7/20; patient presents for 1 week follow-up. She has been using silver alginate with gentamicin cream to the wound bed. She denies any issues and has no complaints today. She denies signs of infection. 7/27; patient presents for 1 week follow-up. She continues to use silver alginate with gentamicin cream to the wound bed. She reports starting her antibiotics. She has no issues or complaints. Overall she reports stability to the wound. 8/3; patient presents for 1 week follow-up. She has been using silver alginate with gentamicin cream to the wound bed. She reports completing all antibiotics. She has no issues or complaints today. She denies signs of infection. 8/17; patient presents for 2-week follow-up. He is to use silver alginate to the wound bed. She has no issues or complaints today. She denies signs of infection. She reports her pain has improved in her foot since last clinic visit 8/24; patient presents for 1 week follow-up. She continues to use silver alginate to the wound bed. She has no issues or complaints. She denies signs of infection. Pain is stable. 9/7; patient presents for follow-up. She missed her last week appointment due to feeling ill. She continues to use silver alginate. She has a new wound to the right lower extremity that is covered in eschar. She states It occurred over the past week and has no idea how it started. She currently denies  signs of infection. 9/14; patient presents for follow-up. To the left foot wound she has been using gentamicin cream and silver alginate. To the right lower extremity wound she has been keeping this covered and has not obtain Santyl. 9/21; patient presents for follow-up. She reports using gentamicin cream and silver alginate to the left foot and Santyl to the right lower extremity wound. She has no issues or complaints today. She denies signs of infection. 9/28; patient presents for follow-up. She reports a new wound to her right heel. She states  this occurred a few days ago and is progressively gotten worse. She has been trying to clean the area with a Q-tip and Santyl. She reports stability in the other 2 wounds. She has been using gentamicin cream and silver alginate to the left foot and Santyl to the right lower extremity wound. 10/12; patient presents for follow-up. She reports improvement to the wound beds. She is seeing vein and vascular to discuss the potential of a left BKA. She states they are going to do an arteriogram. She continues to use silver alginate with dressing changes to her wounds. 11/2; patient presents for follow-up. She states she has not been doing dressing changes to the wound beds. She states she is not able to offload the areas. She reports chronic pain to her left foot wound. 11/9; patient presents for follow-up. She came in with only socks on. She states she forgot to put on shoes. It is unclear if she is doing any dressing changes. She currently denies systemic signs of infection. 11/16; patient presents for follow-up. She came again only with socks on. She states she does not wear shoes ever. It is unclear if she does dressing changes. She currently denies systemic signs of infection. 11/23; patient presents for follow-up. She wore her shoes today. It still unclear exactly what dressing she is using for each wound but she did states she obtained Dakin's solution and  has been using this to the left foot wound. She currently denies signs of infection. CELENE, [name] ([id number]) 11/30; patient presents for follow-up. She has no issues or complaints today. She currently denies signs of infection. 12/7; patient presents for follow-up. She has no issues or complaints today. She has been using Hydrofera Blue to the right heel wound and Dakin solution to the left foot wound. Her right anterior leg wound is healed. She currently denies signs of infection. 12/14; patient presents for follow-up. She has been using Hydrofera Blue to the right heel and Dakin's to the left foot wounds. She has no issues or complaints today. She denies signs of infection. 12/21; patient presents for follow-up. She reports using Hydrofera Blue to the right heel and Dakin's to the left foot wound. She denies signs of infection. 12/28; patient presents for follow-up. She continues to use Dakin's to the left foot wound and Hydrofera Blue to the right heel wound. She denies signs of infection. 1/4; patient presents for follow-up. She has no issues or complaints today. She denies signs of infection. 1/11; patient presents for follow-up. It is unclear if she has been dressing these wounds over the past week. She currently denies signs of infection. 1/18; patient presents for follow-up. She states she has been using Dakin's wet-to-dry dressings to the left foot. She has been using Hydrofera Blue to the right foot foot wound. She states that the anterior right leg wound has reopened and draining serous fluid. She denies signs of infection. 1/25; patient presents for follow-up. She has no issues or complaints today. 2/1; patient presents for follow-up. She has no issues or complaints today. She denies signs of infection. 2/8; patient presents for follow-up. She has lost her surgical shoes. She did not have a dressing to the right heel wound. She currently denies signs of infection. 2/15; patient  presents for follow-up. She reports more pain to the right heel today. She denies purulent drainage Or fever/chills 2/22; patient presents for follow-up. She reports taking clindamycin over the past week. She states that she continues to have pain  to her right heel. She reports purulent drainage. Readmission 03/16/2022 Ms. Raela Bohl is a 46 year old female with a past medical history of type 2 diabetes, osteomyelitis to her feet, chronic systolic heart failure and bipolar disorder that presents to the clinic for bilateral feet wounds and right lower extremity wound. She was last seen in our clinic on 12/15/2021. At that time she had purulent drainage coming out of her right plantar foot and I recommended she go to the ED. She states she went to [facility] and has been there for the past 3 months. I cannot see the records. She states she had OR debridement and was on several weeks of IV antibiotics while inpatient. Since discharge she has not been taking care of the wound beds. She had nothing on her feet other than socks today. She currently denies signs of infection. 5/31; patient presents for follow-up. She has been using Dakin's wet-to-dry dressings to the wound beds on her feet bilaterally and antibiotic ointment to the right anterior leg wound. She had a wound culture done at last clinic visit that showed moderate Pseudomonas aeruginosa sensitive to ciprofloxacin. She currently denies systemic signs of infection. 6/14; patient presents for follow-up. She received Keystone 5 days ago and has been using this on the wound beds. She states that last week she had to go to the hospital because she had increased warmth and erythema to the right foot. She was started on 2 oral antibiotics. She states she has been taking these. She currently denies systemic signs of infection. She has no issues or complaints today. 6/21; patient presents for follow-up. She states she has been using  Keystone antibiotics to the wound beds. She has no issues or complaints today. She denies signs of infection. 6/28; patient presents for follow-up. She has been using Keystone antibiotics to the wound beds. She has no issues or complaints today. 7/12; patient presents for follow-up. Has been using Keystone antibiotics to the wound beds with calcium alginate. She has no issues or complaints today. She never followed up with her orthopedic surgeon who did the OR debridement to the right foot. We discussed the total contact cast for the left foot and patient would like to do this next week. 7/19; patient presents for follow-up. She has been using Keystone antibiotics with calcium alginate to the wound beds. She has no issues or complaints today. Patient is in agreement to do the total contact cast of the left foot today. She knows to return later this week for the obligatory cast change. 05-13-2022 upon evaluation today patient's wound which she has the cast of the left leg actually appears to be doing significantly better. Fortunately I do not see any signs of active infection locally or systemically which is great news and overall I am extremely pleased with where we stand currently. 7/26; patient presents for follow-up. She has a cast in place for the past week. She states it irritated her shin. Other than that she tolerated the cast well. She states she would like a break for 1 week from the cast. We have been using Keystone antibiotic and Aquacel to both wound beds. She denies signs of infection. 8/2; patient presents for follow-up. She has been using Keystone and Aquacel to the wound beds. She denies any issues and has no complaints. She is agreeable to have the cast placed today for the left leg. 06-03-2022 upon evaluation today patient appears to be doing well with regard to her wound she saw  some signs of improvement which is great news. Fortunately I do not see any evidence of active infection  locally or systemically at this time which is great news. No fevers, chills, nausea, vomiting, or diarrhea. [name], [name] ([id number]) 8/16; patient presents for follow-up. She has no issues or complaints today. We have been using Keystone and Aquacel to the wound beds. The left lower extremity is in a total contact cast. She is tolerated this well. 8/23; patient presents for follow-up. She has had the total contact cast on the left leg for the past week. Unfortunately this has rubbed and broken down the skin to the medial foot. She currently denies signs of infection. She has been using Keystone antibiotic to the right plantar foot wound. 8/30; patient presents for follow-up. We have held off on the total contact cast for the left leg for the past week. Her wound on the left foot has improved and the previous surrounding breakdown of skin has epithelialized. She has been using Keystone antibiotic to both wound beds. She has no issues or complaints today. She denies signs of infection. 9/6; patient presents for follow-up. She has ordered her's Keystone antibiotic and this is arriving this week. She has been doing Dakin's wet-to- dry dressings to the wound beds. She denies signs of infection. She is agreeable to the total contact cast today. 9/13; patient presents for follow-up. She states that the cast caused her left leg shin to be sore. She would like to take a break from the cast this week. She has been using Keystone antibiotic to the right plantar foot wound. She denies signs of infection. 9/20; patient presents for follow-up. She has been using Keystone antibiotics to the wound beds with calcium alginate to the right foot wound and Hydrofera Blue to the left foot wound. She is agreeable to having the cast placed today. She has been approved for Apligraf and we will order this for next clinic visit. 9/27; patient presents for follow-up. We have been using Keystone antibiotic with Hydrofera  Blue to the left foot wound under a total contact cast. To the right foot wound she has been using Keystone antibiotic and calcium alginate. She declines a total contact cast today. Apligraf is available for placement and she would like to proceed with this. Patient History Information obtained from Patient. Social History Current every day smoker, Marital Status - Single, Alcohol Use - Never, Drug Use - Prior History, Caffeine Use - Daily. Medical History Ear/Nose/Mouth/Throat Patient has history of Chronic sinus problems/congestion, Middle ear problems Hematologic/Lymphatic Patient has history of Anemia Respiratory Patient has history of Chronic Obstructive Pulmonary Disease (COPD) Cardiovascular Patient has history of Congestive Heart Failure - EF 33% Endocrine Patient has history of Type II Diabetes Genitourinary Patient has history of End Stage Renal Disease Integumentary (Skin) Patient has history of History of pressure wounds Neurologic Patient has history of Neuropathy - feel and legs Objective Constitutional Vitals Time Taken: 9:39 AM, Height: 69 in, Weight: 178 lbs, BMI: 26.3, Temperature: 98.5 F, Pulse: 54 bpm, Respiratory Rate: 16 breaths/min, Blood Pressure: 110/74 mmHg. General Notes: Right foot: To the plantar heel there is an incision site with increased depth. Nonviable tissue at the surface, callus present. No probing to bone. Left foot: To the medial aspect there is an open wound with granulation tissue and nonviable tissue with surrounding callus. Again no signs of infection. Integumentary (Hair, Skin) Wound #11 status is Open. Original cause of wound was Surgical Injury. The date acquired was:  12/01/2021. The wound has been in treatment 18 weeks. The wound is located on the [place]. The wound measures 0.3cm length x 0.2cm width x 0.7cm depth; 0.047cm^2 area and 0.033cm^3 volume. There is Fat Layer (Subcutaneous Tissue) exposed. There is no tunneling  or undermining noted. There is a medium amount of serosanguineous drainage noted. The wound margin is distinct with the outline attached to the wound base. There is medium (34-66%) pink, pale granulation within the wound bed. There is a medium (34-66%) amount of necrotic tissue within the wound bed including Adherent Slough. Caravello, Blen A. ([id number]) Wound #12 status is Open. Original cause of wound was Pressure Injury. The date acquired was: 03/16/2020. The wound has been in treatment 18 weeks. The wound is located on the [place]. The wound measures 2.5cm length x 0.9cm width x 0.1cm depth; 1.767cm^2 area and 0.177cm^3 volume. There is Fat Layer (Subcutaneous Tissue) exposed. There is a medium amount of serous drainage noted. The wound margin is flat and intact. There is large (67-100%) red, pink granulation within the wound bed. There is a small (1-33%) amount of necrotic tissue within the wound bed including Adherent Slough. Assessment Active Problems ICD-10 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ity Non-pressure chronic ulcer of other part of right foot with fat layer exposed Type 2 diabetes mellitus with foot ulcer Type 2 diabetes mellitus with diabetic polyneuropathy Non-pressure chronic ulcer of other part of right lower leg limited to breakdown of skin The totalPatient's wounds appear well-healing. I debrided nonviable tissue. Apligraf #1 was placed in standard fashion to the left foot wound. I recommended continuing calcium alginate and Keystone antibiotic to the right plantar foot wound. Contact cast. I recommended aggressive offloading. She has a pegasus and surgical shoe. Addendum: Patient changed her mind and actually returned in the afternoon for total contact placement. There were no issues. This was placed in standard fashion. Procedures Wound #11 Pre-procedure diagnosis of Wound #11 is an Open Surgical Wound located  on the Right,Plantar Foot . There was a Excisional Skin/Subcutaneous Tissue Debridement with a total area of 0.25 sq cm performed by Kalman Shan, MD. With the following instrument(s): Curette to remove Viable and Non-Viable tissue/material. Material removed includes Subcutaneous Tissue and Slough and. A time out was conducted at 10:08, prior to the start of the procedure. A Minimum amount of bleeding was controlled with Pressure. The procedure was tolerated well. Post Debridement Measurements: 0.3cm length x 0.2cm width x 0.7cm depth; 0.033cm^3 volume. Character of Wound/Ulcer Post Debridement is stable. 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 There was a Excisional Skin/Subcutaneous Tissue Debridement with a total area of 2.25 sq cm performed by Kalman Shan, MD. With the following instrument(s): Curette to remove Viable and Non-Viable tissue/material. Material removed includes Subcutaneous Tissue and Slough and. A time out was conducted at 10:04, prior to the start of the procedure. A Minimum amount of bleeding was controlled with Pressure. The procedure was tolerated well. Post Debridement Measurements: 2.5cm length x 0.9cm width x 0.1cm depth; 0.177cm^3 volume. Character of Wound/Ulcer Post Debridement is stable. Severity of Tissue Post Debridement is: Fat layer exposed. Post procedure Diagnosis Wound #12: Same as Pre-Procedure Pre-procedure diagnosis of Wound #12 is a Diabetic Wound/Ulcer of the Lower Extremity located on the Left,Medial,Plantar Foot. A skin  graft procedure using a bioengineered skin substitute/cellular or tissue based product was performed by Kalman Shan, MD with the following instrument(s): Scissors. Apligraf was applied and secured with Steri-Strips.  43.6 sq cm of product was utilized and 0.4 sq cm was wasted. Post Application, bolus was applied. A Time Out was conducted at 10:14, prior to the start of the procedure. The procedure was tolerated well. Post procedure Diagnosis Wound #12: Same as Pre-Procedure . Plan Follow-up Appointments: Return Appointment in 1 week. Nurse Visit as needed VANIYAH, LANSKY ([id number]) Bathing/ Shower/ Hygiene: Wash wounds with antibacterial soap and water. No tub bath. Anesthetic (Use 'Patient Medications' Section for Anesthetic Order Entry): Lidocaine applied to wound bed Cellular or Tissue Based Products: Cellular or Tissue Based Product Type: - apligraf #1 applied to left foot Edema Control - Lymphedema / Segmental Compressive Device / Other: Elevate, Exercise Daily and Avoid Standing for Long Periods of Time. Elevate legs to the level of the heart and pump ankles as often as possible Elevate leg(s) parallel to the floor when sitting. Off-Loading: Total Contact Cast to Left Lower Extremity - hold cast for this week contact cast #5 applied Don't get cast wet Other: - keep pressure off of feet. Medications-Please add to medication list.: [employer] - Keystone gel right foot Topical Antibiotic - AandD ointment to broken down area WOUND #11: - Foot Wound Laterality: Plantar, Right Cleanser: Soap and Water 1 x Per Day/30 Days Discharge Instructions: Gently cleanse wound with antibacterial soap, rinse and pat dry prior to dressing wounds Topical: keystone gel 1 x Per Day/30 Days Primary Dressing: Aquacel Extra Hydrofiber Dressing, 2x2 (in/in) 1 x Per Day/30 Days Secondary Dressing: ABD Pad 5x9 (in/in) (Generic) 1 x Per Day/30 Days Discharge Instructions: Cover with ABD pad Secured With: Medipore Tape - 85M Medipore H Soft Cloth Surgical Tape, 2x2 (in/yd) (Generic) 1 x Per Day/30 Days Secured With: Kerlix Roll Sterile or Non-Sterile 6-ply 4.5x4 (yd/yd) (Generic) 1 x Per Day/30 Days Discharge  Instructions: Apply Kerlix as directed WOUND #12: - Foot Wound Laterality: Plantar, Left, Medial Cleanser: Soap and Water 1 x Per Day/30 Days Discharge Instructions: Gently cleanse wound with antibacterial soap, rinse and pat dry prior to dressing wounds Topical: Vitamin AandD Ointment, 4(oz) tube 1 x Per Day/30 Days Discharge Instructions: apply to red, broken down area Primary Dressing: Apligraf 1 x Per Day/30 Days Discharge Instructions: #1 applied today Secondary Dressing: ABD Pad 5x9 (in/in) (Generic) 1 x Per Day/30 Days Discharge Instructions: Cover with ABD pad Secondary Dressing: Gauze 1 x Per Day/30 Days Discharge Instructions: As directed: dry, moistened with saline or moistened with Dakins Solution Secured With: Medipore Tape - 85M Medipore H Soft Cloth Surgical Tape, 2x2 (in/yd) (Generic) 1 x Per Day/30 Days Secured With: Kerlix Roll Sterile or Non-Sterile 6-ply 4.5x4 (yd/yd) (Generic) 1 x Per Day/30 Days Discharge Instructions: Apply Kerlix as directed 1. In office sharp debridement 2. Apligraf #1 placed in standard fashion left foot 3. Use antibiotic and calcium alginate right foot 4. Aggressive offloading 5. Follow-up in 1 week Addendum: Total contact cast placed in standard fashion Electronic Signature(s) Signed: 07/20/2022 1:40:43 PM By: Kalman Shan DO Entered By: Kalman Shan on 07/20/2022 13:39:52 Guinyard, [name] A. ([id number]) -------------------------------------------------------------------------------- ROS/PFSH Details Patient Name: Heather Dear A. Date of Service: 07/20/2022 9:45 AM Medical Record Number: [id number] Patient Account Number: [id number] Date of Birth/Sex: [birth year] (46 y.o. F) Treating RN: Cornell Barman Primary Care Provider: Raelene Bott Other Clinician: Massie Kluver Referring Provider: Raelene Bott Treating Provider/Extender:  Kalman Shan Weeks in Treatment: 18 Information Obtained  From Patient Ear/Nose/Mouth/Throat Medical History: Positive for: Chronic sinus problems/congestion; Middle ear problems Hematologic/Lymphatic Medical History: Positive for: Anemia Respiratory Medical History: Positive for: Chronic Obstructive Pulmonary Disease (COPD) Cardiovascular Medical History: Positive for: Congestive Heart Failure - EF 33% Endocrine Medical History: Positive for: Type II Diabetes Time with diabetes: 14 years Treated with: Insulin, Oral agents Blood sugar tested every day: No Blood sugar testing results: Bedtime: 176 Genitourinary Medical History: Positive for: End Stage Renal Disease Integumentary (Skin) Medical History: Positive for: History of pressure wounds Neurologic Medical History: Positive for: Neuropathy - feel and legs HBO Extended History Items Ear/Nose/Mouth/Throat: Ear/Nose/Mouth/Throat: Chronic sinus problems/congestion Middle ear problems Immunizations Pneumococcal Vaccine: Received Pneumococcal Vaccination: No Poyser, Tymber A. ([id number]) Implantable Devices No devices added Family and Social History Current every day smoker; Marital Status - Single; Alcohol Use: Never; Drug Use: Prior History; Caffeine Use: Daily Electronic Signature(s) Signed: 07/20/2022 1:40:43 PM By: Kalman Shan DO Signed: 07/20/2022 5:32:28 PM By: Gretta Cool, BSN, RN, CWS, [name] RN, BSN Entered By: Kalman Shan on 07/20/2022 12:24:45 [name] ([id number]) -------------------------------------------------------------------------------- Total Contact Cast Details Patient Name: Heather Dear A. Date of Service: 07/20/2022 9:45 AM Medical Record Number: [id number] Patient Account Number: [id number] Date of Birth/Sex: 1976/06/22 (46 y.o. F) Treating RN: Carlene Coria Primary Care Provider: Raelene Bott Other Clinician: Massie Kluver Referring Provider: Raelene Bott Treating Provider/Extender: Yaakov Guthrie in Treatment: 18 Total  Contact Cast Applied for Wound Assessment: Wound #11 Right,Plantar Foot Performed By: Physician Kalman Shan, MD Post Procedure Diagnosis Same as Pre-procedure Electronic Signature(s) Signed: 07/20/2022 1:50:19 PM By: Carlene Coria RN Signed: 07/20/2022 2:29:02 PM By: Kalman Shan DO Entered By: Carlene Coria on 07/20/2022 13:50:18 Prospero, Harmoney A. ([id number]) -------------------------------------------------------------------------------- SuperBill Details Patient Name: Heather Dear A. Date of Service: 07/20/2022 Medical Record Number: [id number] Patient Account Number: [id number] Date of Birth/Sex: 1976/10/12 (46 y.o. 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.621 Type 2 diabetes mellitus with foot ulcer E11.42 Type 2 diabetes mellitus with diabetic polyneuropathy L97.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 of skin Facility Procedures CPT4 Code: [id number] Description: [phone] (Facility Use Only) Apligraf 44 SQ CM Modifier: Quantity: [place] Code: [id number] Description: 77116 - SKIN SUB GRAFT FACE/NK/HF/G Modifier: Quantity: 1 CPT4 Code: Description: ICD-10 Diagnosis Description 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 Modifier: severity Quantity: Physician Procedures CPT4 Code: [id number] Description: 33832 - WC PHYS SKIN SUB GRAFT FACE/NK/HF/G Modifier: Quantity: 1 CPT4 Code: Description: ICD-10 Diagnosis Description 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  s Modifier: everity Quantity: Electronic Signature(s) Signed: 07/20/2022 4:02:46 PM By: Kalman Shan DO Previous Signature: 07/20/2022 3:26:26 PM Version By: Carlene Coria RN Previous Signature: 07/20/2022 3:59:15 PM Version By: Kalman Shan DO Previous Signature: 07/20/2022 1:40:43 PM Version By: Kalman Shan DO Entered By: Kalman Shan on 07/20/2022 16:02:12

## 2022-07-22 NOTE — Progress Notes (Signed)
[name], [name] ([id number]) Visit Report for 07/20/2022 Arrival Information Details Patient Name: [name], [name] Date of Service: 07/20/2022 9:45 AM Medical Record Number: [id number] Patient Account Number: [id number] Date of Birth/Sex: 1976-10-22 (46 y.o. F) Treating RN: Cornell Barman Primary Care Labresha Mellor: Raelene Bott Other Clinician: Massie Kluver Referring Akashdeep Chuba: Raelene Bott Treating Gerarda Conklin/Extender: Yaakov Guthrie in Treatment: 18 Visit Information History Since Last Visit All ordered tests and consults were completed: No Patient Arrived: Wheel Chair Added or deleted any medications: No Arrival Time: 09:33 Any new allergies or adverse reactions: No Transfer Assistance: EasyPivot Patient Lift Had a fall or experienced change in No activities of daily living that may affect risk of falls: Hospitalized since last visit: No Pain Present Now: No Electronic Signature(s) Signed: 07/22/2022 12:10:36 PM By: Massie Kluver Entered By: Massie Kluver on 07/20/2022 09:39:34 [name], [name] Kitchen ([id number]) -------------------------------------------------------------------------------- Clinic Level of Care Assessment Details Patient Name: Heather Dear A. Date of Service: 07/20/2022 9:45 AM Medical Record Number: [id number] Patient Account Number: [id number] Date of Birth/Sex: 1976-07-12 (46 y.o. F) Treating RN: Cornell Barman Primary Care [name] Rikard: Raelene Bott Other Clinician: Massie Kluver Referring Carla Rashad: Raelene Bott Treating Akhil Piscopo/Extender: Yaakov Guthrie in Treatment: 18 Clinic Level of Care Assessment Items TOOL 1 Quantity Score _0  - Use when EandM and Procedure is performed on INITIAL visit 0 ASSESSMENTS - Nursing Assessment / Reassessment _1  - General Physical Exam (combine w/ comprehensive assessment (listed just below) when performed on new 0 pt. evals) _2  - 0 Comprehensive Assessment (HX, ROS, Risk Assessments, Wounds Hx,  etc.) ASSESSMENTS - Wound and Skin Assessment / Reassessment _3  - Dermatologic / Skin Assessment (not related to wound area) 0 ASSESSMENTS - Ostomy and/or Continence Assessment and Care _4  - Incontinence Assessment and Management 0 _5  - 0 Ostomy Care Assessment and Management (repouching, etc.) PROCESS - Coordination of Care _6  - Simple Patient / Family Education for ongoing care 0 _7  - 0 Complex (extensive) Patient / Family Education for ongoing care _8  - 0 Staff obtains Programmer, systems, Records, Test Results / Process Orders _9  - 0 Staff telephones HHA, Nursing Homes / Clarify orders / etc _10  - 0 Routine Transfer to another Facility (non-emergent condition) _11  - 0 Routine Hospital Admission (non-emergent condition) _12  - 0 New Admissions / Biomedical engineer / Ordering NPWT, Apligraf, etc. _13  - 0 Emergency Hospital Admission (emergent condition) PROCESS - Special Needs _14  - Pediatric / Minor Patient Management 0 _15  - 0 Isolation Patient Management _16  - 0 Hearing / Language / Visual special needs _17  - 0 Assessment of Community assistance (transportation, D/C planning, etc.) _18  - 0 Additional assistance / Altered mentation _19  - 0 Support Surface(s) Assessment (bed, cushion, seat, etc.) INTERVENTIONS - Miscellaneous _20  - External ear exam 0 _21  - 0 Patient Transfer (multiple staff / Civil Service fast streamer / Similar devices) _22  - 0 Simple Staple / Suture removal (25 or less) _23  - 0 Complex Staple / Suture removal (26 or more) _24  - 0 Hypo/Hyperglycemic Management (do not check if billed separately) _25  - 0 Ankle / Brachial Index (ABI) - do not check if billed separately Has the patient been seen at the hospital within the last three years: Yes Total Score: 0 Level Of Care: ____ [name] ([id number]) Electronic Signature(s) Signed: 07/22/2022 12:10:36 PM By: Massie Kluver Entered By: Massie Kluver on 07/20/2022 10:24:26 [name]  ([id number]) -------------------------------------------------------------------------------- Encounter Discharge Information Details Patient Name: Heather Dear A. Date of Service: 07/20/2022 9:45 AM Medical Record Number: [id number] Patient Account Number:  [id number] Date of Birth/Sex: Jun 12, 1976 (45 y.o. F) Treating RN: Cornell Barman Primary Care Serapio Edelson: Raelene Bott Other Clinician: Massie Kluver Referring Ndrew Creason: Raelene Bott Treating Tayllor Breitenstein/Extender: Yaakov Guthrie in Treatment: 18 Encounter Discharge Information Items Post Procedure Vitals Discharge Condition: Stable Temperature (F): 98.5 Ambulatory Status: Wheelchair Pulse (bpm): 54 Discharge Destination: Home Respiratory Rate (breaths/min): 16 Transportation: Private Auto Blood Pressure (mmHg): 110/74 Accompanied By: mother Schedule Follow-up Appointment: Yes Clinical Summary of Care: Electronic Signature(s) Signed: 07/22/2022 12:10:36 PM By: Massie Kluver Entered By: Massie Kluver on 07/20/2022 10:38:47 [name], [name] [name] Kitchen ([id number]) -------------------------------------------------------------------------------- Lower Extremity Assessment Details Patient Name: Heather Dear A. Date of Service: 07/20/2022 9:45 AM Medical Record Number: [id number] Patient Account Number: [id number] Date of Birth/Sex: Jul 01, 1976 (46 y.o. F) Treating RN: Cornell Barman Primary Care Kolden Dupee: Raelene Bott Other Clinician: Massie Kluver Referring Nate Common: Raelene Bott Treating Davonte Siebenaler/Extender: Yaakov Guthrie in Treatment: 18 Electronic Signature(s) Signed: 07/20/2022 5:32:28 PM By: Gretta Cool BSN, RN, CWS, [name] RN, BSN Signed: 07/22/2022 12:10:36 PM By: Massie Kluver Entered By: Massie Kluver on 07/20/2022 09:58:56 [name], [name] ([id number]) -------------------------------------------------------------------------------- Multi Wound Chart Details Patient Name: Heather Dear A. Date of Service: 07/20/2022  9:45 AM Medical Record Number: [id number] Patient Account Number: [id number] Date of Birth/Sex: 02/25/1976 (46 y.o. 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 Comorbid History: Chronic sinus problems/congestion, Chronic sinus problems/congestion, N/A Middle ear problems, Anemia, Middle ear problems, Anemia, Chronic Obstructive Pulmonary Chronic Obstructive Pulmonary Disease (COPD), Congestive Heart Disease (COPD), Congestive Heart Failure, Type II Diabetes, End Stage Failure, Type II Diabetes, End Stage Renal Disease, History of pressure Renal Disease, History of pressure wounds, Neuropathy wounds, Neuropathy Date Acquired: 12/01/2021 03/16/2020 N/A Weeks of Treatment: 18 18 N/A Wound Status: Open Open N/A Wound Recurrence: No No N/A Measurements L x W x D (cm) 0.3x0.2x0.7 2.5x0.9x0.1 N/A Area (cm) : 0.047 1.767 N/A Volume (cm) : 0.033 0.177 N/A % Reduction in Area: 97.50% 62.50% N/A % Reduction in Volume: 98.00% 81.20% N/A Classification: Full Thickness Without Exposed Grade 3 N/A Support Structures Exudate Amount: Medium Medium N/A Exudate Type: Serosanguineous Serous N/A Exudate Color: red, brown amber N/A Wound Margin: Distinct, outline attached Flat and Intact N/A Granulation Amount: Medium (34-66%) Large (67-100%) N/A Granulation Quality: Pink, Pale Red, Pink N/A Necrotic Amount: Medium (34-66%) Small (1-33%) N/A Exposed  Structures: Fat Layer (Subcutaneous Tissue): Fat Layer (Subcutaneous Tissue): N/A Yes Yes Fascia: No Fascia: No Tendon: No Tendon: No Muscle: No Muscle: No Joint: No Joint: No Bone: No Bone: No Epithelialization: Medium (34-66%) Small (1-33%) N/A Debridement: Debridement - Excisional Debridement - Excisional N/A Pre-procedure Verification/Time 10:08 10:04 N/A Out Taken: Tissue Debrided: Subcutaneous, Slough Subcutaneous, Slough N/A Level: Skin/Subcutaneous Tissue Skin/Subcutaneous Tissue N/A Debridement Area (sq cm): 0.25 2.25 N/A [name], [name] ([id number]) Instrument: Curette Curette N/A Bleeding: Minimum Minimum N/A Hemostasis Achieved: Pressure Pressure N/A Debridement Treatment Procedure was tolerated well Procedure was tolerated well N/A Response: Post Debridement 0.3x0.2x0.7 2.5x0.9x0.1 N/A Measurements L x W x D (cm) Post Debridement Volume: 0.033 0.177 N/A (cm) Procedures Performed: Debridement Cellular or Tissue Based Product N/A Debridement Treatment Notes Wound #11 (  Foot) Wound Laterality: Plantar, Right Cleanser Soap and Water Discharge Instruction: Gently cleanse wound with antibacterial soap, rinse and pat dry prior to dressing wounds Peri-Wound Care Topical keystone gel Primary Dressing Aquacel Extra Hydrofiber Dressing, 2x2 (in/in) Secondary Dressing ABD Pad 5x9 (in/in) Discharge Instruction: Cover with ABD pad Secured With Medipore Tape - 77M Medipore H Soft Cloth Surgical Tape, 2x2 (in/yd) Kerlix Roll Sterile or Non-Sterile 6-ply 4.5x4 (yd/yd) Discharge Instruction: Apply Kerlix as directed Compression Wrap Compression Stockings Add-Ons Wound #12 (Foot) Wound Laterality: Plantar, Left, Medial Cleanser Soap and Water Discharge Instruction: Gently cleanse wound with antibacterial soap, rinse and pat dry prior to dressing wounds Peri-Wound Care Topical Vitamin AandD Ointment, 4(oz) tube Discharge Instruction: apply to red, broken down  area Primary Dressing Apligraf Discharge Instruction: #1 applied today Secondary Dressing ABD Pad 5x9 (in/in) Discharge Instruction: Cover with ABD pad Gauze Discharge Instruction: As directed: dry, moistened with saline or moistened with Dakins Solution Secured With Medipore Tape - 77M Medipore H Soft Cloth Surgical Tape, 2x2 (in/yd) Kerlix Roll Sterile or Non-Sterile 6-ply 4.5x4 (yd/yd) [name], [name] Kitchen ([id number]) Discharge Instruction: Apply Kerlix as directed Compression Wrap Compression Stockings Add-Ons Electronic Signature(s) Signed: 07/20/2022 1:40:43 PM By: Kalman Shan DO Entered By: Kalman Shan on 07/20/2022 12:25:02 [name] ([id number]) -------------------------------------------------------------------------------- [place] Details Patient Name: Heather Dear A. Date of Service: 07/20/2022 9:45 AM Medical Record Number: [id number] Patient Account Number: [id number] Date of Birth/Sex: 1976/10/08 (46 y.o. F) Treating RN: Cornell Barman Primary Care Kaysen Deal: Raelene Bott Other Clinician: Massie Kluver Referring Kaityln Kallstrom: Raelene Bott Treating Ily Denno/Extender: Yaakov Guthrie in Treatment: 41 Active Inactive Abuse / Safety / Falls / Self Care Management Nursing Diagnoses: History of Falls Potential for falls Potential for injury related to falls Goals: Patient will not develop complications from immobility Date Initiated: 03/17/2022 Date Inactivated: 04/20/2022 Target Resolution Date: 03/16/2022 Goal Status: Met Patient/caregiver will verbalize understanding of skin care regimen Date Initiated: 03/17/2022 Target Resolution Date: 03/16/2022 Goal Status: Active Patient/caregiver will verbalize/demonstrate measure taken to improve self care Date Initiated: 03/17/2022 Target Resolution Date: 03/16/2022 Goal Status: Active Interventions: Assess fall risk on admission and as needed Provide education on basic  hygiene Provide education on personal and home safety Notes: Necrotic Tissue Nursing Diagnoses: Impaired tissue integrity related to necrotic/devitalized tissue Knowledge deficit related to management of necrotic/devitalized tissue Goals: Necrotic/devitalized tissue will be minimized in the wound bed Date Initiated: 03/17/2022 Date Inactivated: 05/04/2022 Target Resolution Date: 03/16/2022 Goal Status: Met Patient/caregiver will verbalize understanding of reason and process for debridement of necrotic tissue Date Initiated: 03/17/2022 Target Resolution Date: 03/16/2022 Goal Status: Active Interventions: Provide education on necrotic tissue and debridement process Treatment Activities: Apply topical anesthetic as ordered : 03/16/2022 Excisional debridement : 03/16/2022 Notes: Osteomyelitis Nursing Diagnoses: Infection: osteomyelitis Knowledge deficit related to disease process and management [name], [name] ([id number]) Goals: Patient/caregiver will verbalize understanding of disease process and disease management Date Initiated: 03/17/2022 Date Inactivated: 04/20/2022 Target Resolution Date: 03/16/2022 Goal Status: Met Patient's osteomyelitis will resolve Date Initiated: 03/17/2022 Target Resolution Date: 03/16/2022 Goal Status: Active Signs and symptoms for osteomyelitis will be recognized and promptly addressed Date Initiated: 03/17/2022 Target Resolution Date: 03/16/2022 Goal Status: Active Interventions: Assess for signs and symptoms of osteomyelitis resolution every visit Provide education on osteomyelitis Treatment Activities: Systemic antibiotics : 03/16/2022 Notes: Wound/Skin Impairment Nursing Diagnoses: Impaired tissue integrity Knowledge deficit related to smoking impact on wound healing Knowledge deficit related to ulceration/compromised skin integrity Goals: Patient/caregiver will verbalize understanding of skin care regimen  Date Initiated: 03/17/2022 Date  Inactivated: 04/20/2022 Target Resolution Date: 03/16/2022 Goal Status: Met Ulcer/skin breakdown will have a volume reduction of 30% by week 4 Date Initiated: 03/17/2022 Target Resolution Date: 04/13/2022 Goal Status: Active Ulcer/skin breakdown will have a volume reduction of 50% by week 8 Date Initiated: 03/17/2022 Target Resolution Date: 05/04/2022 Goal Status: Active Ulcer/skin breakdown will have a volume reduction of 80% by week 12 Date Initiated: 03/17/2022 Target Resolution Date: 06/01/2022 Goal Status: Active Ulcer/skin breakdown will heal within 14 weeks Date Initiated: 03/17/2022 Target Resolution Date: 06/15/2022 Goal Status: Active Interventions: Assess ulceration(s) every visit Provide education on ulcer and skin care Treatment Activities: Skin care regimen initiated : 03/16/2022 Notes: Electronic Signature(s) Signed: 07/20/2022 5:32:28 PM By: Gretta Cool, BSN, RN, CWS, [name] RN, BSN Signed: 07/22/2022 12:10:36 PM By: Massie Kluver Entered By: Massie Kluver on 07/20/2022 [name], [name] ([id number]) -------------------------------------------------------------------------------- Pain Assessment Details Patient Name: Heather Dear A. Date of Service: 07/20/2022 9:45 AM Medical Record Number: [id number] Patient Account Number: [id number] Date of Birth/Sex: 1975-12-30 (46 y.o. F) Treating RN: Cornell Barman Primary Care Jerel Sardina: Raelene Bott Other Clinician: Massie Kluver Referring Jenny Lai: Raelene Bott Treating Karell Tukes/Extender: Yaakov Guthrie in Treatment: 18 Active Problems Location of Pain Severity and Description of Pain Patient Has Paino No Site Locations Pain Management and Medication Current Pain Management: Electronic Signature(s) Signed: 07/20/2022 5:32:28 PM By: Gretta Cool, BSN, RN, CWS, [name] RN, BSN Signed: 07/22/2022 12:10:36 PM By: Massie Kluver Entered By: Massie Kluver on 07/20/2022 09:45:02 [name]  ([id number]) -------------------------------------------------------------------------------- Patient/Caregiver Education Details Patient Name: Heather Dear A. Date of Service: 07/20/2022 9:45 AM Medical Record Number: [id number] Patient Account Number: [id number] Date of Birth/Gender: March 26, 1976 (46 y.o. F) Treating RN: Cornell Barman Primary Care Physician: Raelene Bott Other Clinician: Massie Kluver Referring Physician: Raelene Bott Treating Physician/Extender: Yaakov Guthrie in Treatment: 11 Education Assessment Education Provided To: Patient Education Topics Provided Wound/Skin Impairment: Handouts: Other: continue wound care as directed Methods: Explain/Verbal Responses: State content correctly Electronic Signature(s) Signed: 07/22/2022 12:10:36 PM By: Massie Kluver Entered By: Massie Kluver on 07/20/2022 10:37:39 [name], [name] ([id number]) -------------------------------------------------------------------------------- Wound Assessment Details Patient Name: Heather Dear A. Date of Service: 07/20/2022 9:45 AM Medical Record Number: [id number] Patient Account Number: [id number] Date of Birth/Sex: 1976/08/26 (46 y.o. F) Treating RN: Cornell Barman Primary Care Lorin Gawron: Raelene Bott Other Clinician: Massie Kluver Referring Rockey Guarino: Raelene Bott Treating Pal Shell/Extender: Yaakov Guthrie in Treatment: 18 Wound Status Wound Number: 11 Primary Open Surgical Wound Etiology: Wound Location: Right, Plantar Foot Wound Open Wounding Event: Surgical Injury Status: Date Acquired: 12/01/2021 Comorbid Chronic sinus problems/congestion, Middle ear problems, Weeks Of Treatment: 18 History: Anemia, Chronic Obstructive Pulmonary Disease (COPD), Clustered Wound: No Congestive Heart Failure, Type II Diabetes, End Stage Renal Disease, History of pressure wounds, Neuropathy Photos Wound Measurements Length: (cm) 0.3 Width: (cm) 0.2 Depth: (cm) 0.7 Area:  (cm) 0.047 Volume: (cm) 0.033 % Reduction in Area: 97.5% % Reduction in Volume: 98% Epithelialization: Medium (34-66%) Tunneling: No Undermining: No Wound Description Classification: Full Thickness Without Exposed Support Structures Wound Margin: Distinct, outline attached Exudate Amount: Medium Exudate Type: Serosanguineous Exudate Color: red, brown Foul Odor After Cleansing: No Slough/Fibrino Yes Wound Bed Granulation Amount: Medium (34-66%) Exposed Structure Granulation Quality: Pink, Pale Fascia Exposed: No Necrotic Amount: Medium (34-66%) Fat Layer (Subcutaneous Tissue) Exposed: Yes Necrotic Quality: Adherent Slough Tendon Exposed: No Muscle Exposed: No Joint Exposed: No Bone Exposed: No Treatment Notes Wound #11 (Foot) Wound Laterality: Plantar, Right Cleanser Soap and Water Discharge Instruction: Gently  cleanse wound with antibacterial soap, rinse and pat dry prior to dressing wounds [name], Anivea A. ([id number]) Peri-Wound Care Topical keystone gel Primary Dressing Aquacel Extra Hydrofiber Dressing, 2x2 (in/in) Secondary Dressing ABD Pad 5x9 (in/in) Discharge Instruction: Cover with ABD pad Secured With Medipore Tape - 76M Medipore H Soft Cloth Surgical Tape, 2x2 (in/yd) Kerlix Roll Sterile or Non-Sterile 6-ply 4.5x4 (yd/yd) Discharge Instruction: Apply Kerlix as directed Compression Wrap Compression Stockings Add-Ons Electronic Signature(s) Signed: 07/20/2022 5:32:28 PM By: Gretta Cool, BSN, RN, CWS, [name] RN, BSN Signed: 07/22/2022 12:10:36 PM By: Massie Kluver Entered By: Massie Kluver on 07/20/2022 09:58:01 Suares, Faelyn A. ([id number]) -------------------------------------------------------------------------------- Wound Assessment Details Patient Name: Heather Dear A. Date of Service: 07/20/2022 9:45 AM Medical Record Number: [id number] Patient Account Number: [id number] Date of Birth/Sex: 1976-03-04 (46 y.o. F) Treating RN: Cornell Barman Primary Care  Marlon Vonruden: Raelene Bott Other Clinician: Massie Kluver Referring Tayden Nichelson: Raelene Bott Treating January Bergthold/Extender: Yaakov Guthrie in Treatment: 18 Wound Status Wound Number: 12 Primary Diabetic Wound/Ulcer of the Lower Extremity Etiology: Wound Location: Left, Medial, Plantar Foot Wound Open Wounding Event: Pressure Injury Status: Date Acquired: 03/16/2020 Comorbid Chronic sinus problems/congestion, Middle ear problems, Weeks Of Treatment: 18 History: Anemia, Chronic Obstructive Pulmonary Disease (COPD), Clustered Wound: No Congestive Heart Failure, Type II Diabetes, End Stage Renal Disease, History of pressure wounds, Neuropathy Photos Wound Measurements Length: (cm) 2.5 Width: (cm) 0.9 Depth: (cm) 0.1 Area: (cm) 1.767 Volume: (cm) 0.177 % Reduction in Area: 62.5% % Reduction in Volume: 81.2% Epithelialization: Small (1-33%) Wound Description Classification: Grade 3 Wound Margin: Flat and Intact Exudate Amount: Medium Exudate Type: Serous Exudate Color: amber Foul Odor After Cleansing: No Slough/Fibrino Yes Wound Bed Granulation Amount: Large (67-100%) Exposed Structure Granulation Quality: Red, Pink Fascia Exposed: No Necrotic Amount: Small (1-33%) Fat Layer (Subcutaneous Tissue) Exposed: Yes Necrotic Quality: Adherent Slough Tendon Exposed: No Muscle Exposed: No Joint Exposed: No Bone Exposed: No Treatment Notes Wound #12 (Foot) Wound Laterality: Plantar, Left, Medial Cleanser Soap and Water Discharge Instruction: Gently cleanse wound with antibacterial soap, rinse and pat dry prior to dressing wounds Ogletree, Sharlyn A. ([id number]) Peri-Wound Care Topical Vitamin AandD Ointment, 4(oz) tube Discharge Instruction: apply to red, broken down area Primary Dressing Apligraf Discharge Instruction: #1 applied today Secondary Dressing ABD Pad 5x9 (in/in) Discharge Instruction: Cover with ABD pad Gauze Discharge Instruction: As directed: dry,  moistened with saline or moistened with Dakins Solution Secured With Medipore Tape - 76M Medipore H Soft Cloth Surgical Tape, 2x2 (in/yd) Kerlix Roll Sterile or Non-Sterile 6-ply 4.5x4 (yd/yd) Discharge Instruction: Apply Kerlix as directed Compression Wrap Compression Stockings Add-Ons Electronic Signature(s) Signed: 07/20/2022 5:32:28 PM By: Gretta Cool, BSN, RN, CWS, [name] RN, BSN Signed: 07/22/2022 12:10:36 PM By: Massie Kluver Entered By: Massie Kluver on 07/20/2022 09:58:46 [place], Archita A. ([id number]) -------------------------------------------------------------------------------- [place] Details Patient Name: Heather Dear A. Date of Service: 07/20/2022 9:45 AM Medical Record Number: [id number] Patient Account Number: [id number] Date of Birth/Sex: 1976/03/12 (46 y.o. F) Treating RN: Cornell Barman Primary Care Haruka Kowaleski: Raelene Bott Other Clinician: Massie Kluver Referring Lillie Bollig: Raelene Bott Treating Thierry Dobosz/Extender: Yaakov Guthrie in Treatment: 18 Vital Signs Time Taken: 09:39 Temperature (F): 98.5 Height (in): 69 Pulse (bpm): 54 Weight (lbs): 178 Respiratory Rate (breaths/min): 16 Body Mass Index (BMI): 26.3 Blood Pressure (mmHg): 110/74 Reference Range: 80 - 120 mg / dl Electronic Signature(s) Signed: 07/22/2022 12:10:36 PM By: Massie Kluver Entered By: Massie Kluver on 07/20/2022 09:44:50

## 2022-07-27 ENCOUNTER — Ambulatory Visit: Payer: Medicaid Other | Admitting: Internal Medicine

## 2022-07-28 ENCOUNTER — Encounter: Payer: Medicaid Other | Attending: Internal Medicine | Admitting: Internal Medicine

## 2022-07-28 DIAGNOSIS — L97528 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ity: Secondary | ICD-10-CM | POA: Insufficient documentation

## 2022-07-28 DIAGNOSIS — E11621 Type 2 diabetes mellitus with foot ulcer: Secondary | ICD-10-CM | POA: Diagnosis present

## 2022-07-28 DIAGNOSIS — E1142 Type 2 diabetes mellitus with diabetic polyneuropathy: Secondary | ICD-10-CM | POA: Diagnosis not present

## 2022-07-28 DIAGNOSIS — L97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 of skin: Secondary | ICD-10-CM | POA: Insufficient documentation

## 2022-07-28 DIAGNOSIS — L97512 Non-pressure chronic ulcer of other part of right foot with fat layer exposed: Secondary | ICD-10-CM | POA: Insufficient documentation

## 2022-07-29 NOTE — Progress Notes (Signed)
Heather, [name] ([id number]) Visit Report for 07/28/2022 Arrival Information Details Patient Name: Heather, [name] Date of Service: 07/28/2022 12:30 PM Medical Record Number: [id number] Patient Account Number: [id number] Date of Birth/Sex: 01-29-1976 (46 y.o. F) Treating RN: Carlene Coria Primary Care Verlia Kaney: Raelene Bott Other Clinician: Referring Cyanna Neace: Raelene Bott Treating Jannette Cotham/Extender: Skipper Cliche in Treatment: 34 Visit Information History Since Last Visit All ordered tests and consults were completed: No Patient Arrived: Wheel Chair Added or deleted any medications: No Arrival Time: 12:35 Any new allergies or adverse reactions: No Accompanied By: fiend Had a fall or experienced change in No Transfer Assistance: None activities of daily living that may affect Patient Identification Verified: Yes risk of falls: Secondary Verification Process Completed: Yes Signs or symptoms of abuse/neglect since last visito No Patient Requires Transmission-Based Precautions: No Hospitalized since last visit: No Patient Has Alerts: No Implantable device outside of the clinic excluding No cellular tissue based products placed in the center since last visit: Has Dressing in Place as Prescribed: Yes Has Footwear/Offloading in Place as Prescribed: Yes Left: Total Contact Cast Pain Present Now: No Electronic Signature(s) Signed: 07/29/2022 12:45:55 PM By: Carlene Coria RN Entered By: Carlene Coria on 07/28/2022 12:39:38 [name] ([id number]) -------------------------------------------------------------------------------- Clinic Level of Care Assessment Details Patient Name: Heather Dear A. Date of Service: 07/28/2022 12:30 PM Medical Record Number: [id number] Patient Account Number: [id number] Date of Birth/Sex: 08-Feb-1976 (46 y.o. F) Treating RN: Carlene Coria Primary Care Cristina Mattern: Raelene Bott Other Clinician: Referring Polette Nofsinger: Raelene Bott Treating  Raiden Haydu/Extender: Skipper Cliche in Treatment: 19 Clinic Level of Care Assessment Items TOOL 1 Quantity Score []  - Use when EandM and Procedure is performed on INITIAL visit 0 ASSESSMENTS - Nursing Assessment / Reassessment []  - General Physical Exam (combine w/ comprehensive assessment (listed just below) when performed on new 0 pt. evals) []  - 0 Comprehensive Assessment (HX, ROS, Risk Assessments, Wounds Hx, etc.) ASSESSMENTS - Wound and Skin Assessment / Reassessment []  - Dermatologic / Skin Assessment (not related to wound area) 0 ASSESSMENTS - Ostomy and/or Continence Assessment and Care []  - Incontinence Assessment and Management 0 []  - 0 Ostomy Care Assessment and Management (repouching, etc.) PROCESS - Coordination of Care []  - Simple Patient / Family Education for ongoing care 0 []  - 0 Complex (extensive) Patient / Family Education for ongoing care []  - 0 Staff obtains Programmer, systems, Records, Test Results / Process Orders []  - 0 Staff telephones HHA, Nursing Homes / Clarify orders / etc []  - 0 Routine Transfer to another Facility (non-emergent condition) []  - 0 Routine Hospital Admission (non-emergent condition) []  - 0 New Admissions / Biomedical engineer / Ordering NPWT, Apligraf, etc. []  - 0 Emergency Hospital Admission (emergent condition) PROCESS - Special Needs []  - Pediatric / Minor Patient Management 0 []  - 0 Isolation Patient Management []  - 0 Hearing / Language / Visual special needs []  - 0 Assessment of Community assistance (transportation, D/C planning, etc.) []  - 0 Additional assistance / Altered mentation []  - 0 Support Surface(s) Assessment (bed, cushion, seat, etc.) INTERVENTIONS - Miscellaneous []  - External ear exam 0 []  - 0 Patient Transfer (multiple staff / Civil Service fast streamer / Similar devices) []  - 0 Simple Staple / Suture removal (25 or less) []  - 0 Complex Staple / Suture removal (26 or more) []  - 0 Hypo/Hyperglycemic Management (do not  check if billed separately) []  - 0 Ankle / Brachial Index (ABI) - do not check if billed separately Has the patient been seen at  the hospital within the last three years: Yes Total Score: 0 Level Of Care: ____ [name] ([id number]) Electronic Signature(s) Signed: 07/29/2022 12:45:55 PM By: Carlene Coria RN Entered By: Carlene Coria on 07/28/2022 13:08:16 [name] ([id number]) -------------------------------------------------------------------------------- Encounter Discharge Information Details Patient Name: Heather Dear A. Date of Service: 07/28/2022 12:30 PM Medical Record Number: [id number] Patient Account Number: [id number] Date of Birth/Sex: 1975-12-22 (46 y.o. F) Treating RN: Carlene Coria Primary Care Kellie Chisolm: Raelene Bott Other Clinician: Referring Vallarie Fei: Raelene Bott Treating Nataya Bastedo/Extender: Skipper Cliche in Treatment: 19 Encounter Discharge Information Items Post Procedure Vitals Discharge Condition: Stable Temperature (F): 98.2 Ambulatory Status: Ambulatory Pulse (bpm): 62 Discharge Destination: Home Respiratory Rate (breaths/min): 18 Transportation: Private Auto Blood Pressure (mmHg): 115/58 Accompanied By: friend Schedule Follow-up Appointment: Yes Clinical Summary of Care: Electronic Signature(s) Signed: 07/28/2022 2:28:04 PM By: Carlene Coria RN Entered By: Carlene Coria on 07/28/2022 14:28:04 [name] ([id number]) -------------------------------------------------------------------------------- Lower Extremity Assessment Details Patient Name: Heather Dear A. Date of Service: 07/28/2022 12:30 PM Medical Record Number: [id number] Patient Account Number: [id number] Date of Birth/Sex: 06-25-1976 (46 y.o. F) Treating RN: Carlene Coria Primary Care Jahmiya Guidotti: Raelene Bott Other Clinician: Referring Ceferino Lang: Raelene Bott Treating Shawnise Peterkin/Extender: Skipper Cliche in Treatment: 19 Vascular Assessment Pulses: Dorsalis  Pedis Palpable: [Left:Yes] [Right:Yes] Electronic Signature(s) Signed: 07/29/2022 12:45:55 PM By: Carlene Coria RN Entered By: Carlene Coria on 07/28/2022 01:31:43 [name], [name] ([id number]) -------------------------------------------------------------------------------- Multi Wound Chart Details Patient Name: Heather Dear A. Date of Service: 07/28/2022 12:30 PM Medical Record Number: [id number] Patient Account Number: [id number] Date of Birth/Sex: [birth year] (46 y.o. 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 Comorbid History: Chronic sinus problems/congestion, Chronic sinus problems/congestion, N/A Middle ear problems, Anemia, Middle ear problems, Anemia, Chronic Obstructive Pulmonary Chronic Obstructive Pulmonary Disease (COPD), Congestive Heart Disease (COPD), Congestive Heart Failure, Type II Diabetes, End Stage Failure, Type II Diabetes, End Stage Renal Disease, History of pressure Renal Disease, History of pressure wounds, Neuropathy wounds, Neuropathy Date Acquired: 12/01/2021 03/16/2020 N/A Weeks of Treatment: 19 19 N/A Wound Status: Open Open N/A Wound Recurrence: No No N/A Measurements L x W x D (cm) 0.5x0.3x0.6 1.5x0.5x0.1 N/A Area (cm) : 0.118 0.589 N/A Volume (cm) : 0.071 0.059 N/A % Reduction in Area: 93.60% 87.50% N/A % Reduction in Volume: 95.70% 93.70% N/A Classification: Full Thickness Without Exposed Grade 3 N/A Support  Structures Exudate Amount: Medium Medium N/A Exudate Type: Serosanguineous Serous N/A Exudate Color: red, brown amber N/A Wound Margin: Distinct, outline attached Flat and Intact N/A Granulation Amount: Medium (34-66%) Large (67-100%) N/A Granulation Quality: Pink, Pale Red, Pink N/A Necrotic Amount: Medium (34-66%) None Present (0%) N/A Exposed Structures: Fat Layer (Subcutaneous Tissue): Fat Layer (Subcutaneous Tissue): N/A Yes Yes Fascia: No Fascia: No Tendon: No Tendon: No Muscle: No Muscle: No Joint: No Joint: No Bone: No Bone: No Epithelialization: Medium (34-66%) Small (1-33%) N/A Treatment Notes Heather, TIMONEY A. ([id number]) Electronic Signature(s) Signed: 07/29/2022 12:45:55 PM By: Carlene Coria RN Entered By: Carlene Coria on 07/28/2022 12:56:53 [name] ([id number]) -------------------------------------------------------------------------------- [place] Details Patient Name: Heather Dear A. Date of Service: 07/28/2022  12:30 PM Medical Record Number: [id number] Patient Account Number: [id number] Date of Birth/Sex: [birth year] (46 y.o. F) Treating RN: Carlene Coria Primary Care Ercelle Winkles: Raelene Bott Other Clinician: Referring Johnika Escareno: Raelene Bott Treating Travonta Gill/Extender: Skipper Cliche in Treatment: 73 Active Inactive Abuse / Safety / Falls / Self Care Management Nursing Diagnoses: History of Falls Potential for falls Potential for injury related to falls Goals: Patient will not develop complications from immobility Date Initiated: 03/17/2022 Date Inactivated: 04/20/2022 Target Resolution Date: 03/16/2022 Goal Status: Met Patient/caregiver will verbalize understanding of skin care regimen Date Initiated: 03/17/2022 Target Resolution Date: 03/16/2022 Goal Status: Active Patient/caregiver will verbalize/demonstrate measure taken to improve self care Date Initiated: 03/17/2022 Target Resolution Date: 03/16/2022 Goal Status:  Active Interventions: Assess fall risk on admission and as needed Provide education on basic hygiene Provide education on personal and home safety Notes: Necrotic Tissue Nursing Diagnoses: Impaired tissue integrity related to necrotic/devitalized tissue Knowledge deficit related to management of necrotic/devitalized tissue Goals: Necrotic/devitalized tissue will be minimized in the wound bed Date Initiated: 03/17/2022 Date Inactivated: 05/04/2022 Target Resolution Date: 03/16/2022 Goal Status: Met Patient/caregiver will verbalize understanding of reason and process for debridement of necrotic tissue Date Initiated: 03/17/2022 Target Resolution Date: 03/16/2022 Goal Status: Active Interventions: Provide education on necrotic tissue and debridement process Treatment Activities: Apply topical anesthetic as ordered : 03/16/2022 Excisional debridement : 03/16/2022 Notes: Osteomyelitis Nursing Diagnoses: Infection: osteomyelitis Knowledge deficit related to disease process and management SHALANA, JARDIN ([id number]) Goals: Patient/caregiver will verbalize understanding of disease process and disease management Date Initiated: 03/17/2022 Date Inactivated: 04/20/2022 Target Resolution Date: 03/16/2022 Goal Status: Met Patient's osteomyelitis will resolve Date Initiated: 03/17/2022 Target Resolution Date: 03/16/2022 Goal Status: Active Signs and symptoms for osteomyelitis will be recognized and promptly addressed Date Initiated: 03/17/2022 Target Resolution Date: 03/16/2022 Goal Status: Active Interventions: Assess for signs and symptoms of osteomyelitis resolution every visit Provide education on osteomyelitis Treatment Activities: Systemic antibiotics : 03/16/2022 Notes: Wound/Skin Impairment Nursing Diagnoses: Impaired tissue integrity Knowledge deficit related to smoking impact on wound healing Knowledge deficit related to ulceration/compromised skin  integrity Goals: Patient/caregiver will verbalize understanding of skin care regimen Date Initiated: 03/17/2022 Date Inactivated: 04/20/2022 Target Resolution Date: 03/16/2022 Goal Status: Met Ulcer/skin breakdown will have a volume reduction of 30% by week 4 Date Initiated: 03/17/2022 Target Resolution Date: 04/13/2022 Goal Status: Active Ulcer/skin breakdown will have a volume reduction of 50% by week 8 Date Initiated: 03/17/2022 Target Resolution Date: 05/04/2022 Goal Status: Active Ulcer/skin breakdown will have a volume reduction of 80% by week 12 Date Initiated: 03/17/2022 Target Resolution Date: 06/01/2022 Goal Status: Active Ulcer/skin breakdown will heal within 14 weeks Date Initiated: 03/17/2022 Target Resolution Date: 06/15/2022 Goal Status: Active Interventions: Assess ulceration(s) every visit Provide education on ulcer and skin care Treatment Activities: Skin care regimen initiated : 03/16/2022 Notes: Electronic Signature(s) Signed: 07/29/2022 12:45:55 PM By: Carlene Coria RN Entered By: Carlene Coria on 07/28/2022 12:54:58 [name], [name] ([id number]) -------------------------------------------------------------------------------- Pain Assessment Details Patient Name: Heather Dear A. Date of Service: 07/28/2022 12:30 PM Medical Record Number: [id number] Patient Account Number: [id number] Date of Birth/Sex: 08-Apr-1976 (46 y.o. F) Treating RN: Carlene Coria Primary Care Alexine Pilant: Raelene Bott Other Clinician: Referring Lanayah Gartley: Raelene Bott Treating Emilianna Barlowe/Extender: Skipper Cliche in Treatment: 19 Active Problems Location of Pain Severity and Description of Pain Patient Has Paino No Site Locations Pain Management and Medication Current Pain Management: Electronic Signature(s) Signed: 07/29/2022 12:45:55 PM By: Carlene Coria RN Entered By: Carlene Coria on 07/28/2022 12:39:58 [name], [name]  (  [id number]) -------------------------------------------------------------------------------- Patient/Caregiver Education Details Patient Name: Heather Dear A. Date of Service: 07/28/2022 12:30 PM Medical Record Number: [id number] Patient Account Number: [id number] Date of Birth/Gender: May 29, 1976 (46 y.o. F) Treating RN: Carlene Coria Primary Care Physician: Raelene Bott Other Clinician: Referring Physician: Raelene Bott Treating Physician/Extender: Skipper Cliche in Treatment: 21 Education Assessment Education Provided To: Patient Education Topics Provided Infection: Methods: Explain/Verbal Responses: State content correctly Safety: Methods: Explain/Verbal Responses: State content correctly Wound Debridement: Methods: Explain/Verbal Responses: State content correctly Electronic Signature(s) Signed: 07/29/2022 12:45:55 PM By: Carlene Coria RN Entered By: Carlene Coria on 07/28/2022 13:09:29 [name], [name] ([id number]) -------------------------------------------------------------------------------- Wound Assessment Details Patient Name: Heather Dear A. Date of Service: 07/28/2022 12:30 PM Medical Record Number: [id number] Patient Account Number: [id number] Date of Birth/Sex: 01/27/1976 (46 y.o. F) Treating RN: Carlene Coria Primary Care Ahniyah Giancola: Raelene Bott Other Clinician: Referring Hilma Steinhilber: Raelene Bott Treating Marrian Bells/Extender: Skipper Cliche in Treatment: 19 Wound Status Wound Number: 11 Primary Open Surgical Wound Etiology: Wound Location: Right, Plantar Foot Wound Open Wounding Event: Surgical Injury Status: Date Acquired: 12/01/2021 Comorbid Chronic sinus problems/congestion, Middle ear problems, Weeks Of Treatment: 19 History: Anemia, Chronic Obstructive Pulmonary Disease (COPD), Clustered Wound: No Congestive Heart Failure, Type II Diabetes, End Stage Renal Disease, History of pressure wounds, Neuropathy Photos Wound Measurements Length:  (cm) 0.5 Width: (cm) 0.3 Depth: (cm) 0.6 Area: (cm) 0.118 Volume: (cm) 0.071 % Reduction in Area: 93.6% % Reduction in Volume: 95.7% Epithelialization: Medium (34-66%) Tunneling: No Undermining: No Wound Description Classification: Full Thickness Without Exposed Support Structures Wound Margin: Distinct, outline attached Exudate Amount: Medium Exudate Type: Serosanguineous Exudate Color: red, brown Foul Odor After Cleansing: No Slough/Fibrino Yes Wound Bed Granulation Amount: Medium (34-66%) Exposed Structure Granulation Quality: Pink, Pale Fascia Exposed: No Necrotic Amount: Medium (34-66%) Fat Layer (Subcutaneous Tissue) Exposed: Yes Necrotic Quality: Adherent Slough Tendon Exposed: No Muscle Exposed: No Joint Exposed: No Bone Exposed: No Treatment Notes Wound #11 (Foot) Wound Laterality: Plantar, Right Cleanser Soap and Water Discharge Instruction: Gently cleanse wound with antibacterial soap, rinse and pat dry prior to dressing wounds [name], [name] ([id number]) Peri-Wound Care Topical keystone gel Primary Dressing Aquacel Extra Hydrofiber Dressing, 2x2 (in/in) Secondary Dressing ABD Pad 5x9 (in/in) Discharge Instruction: Cover with ABD pad Secured With Medipore Tape - 6M Medipore H Soft Cloth Surgical Tape, 2x2 (in/yd) Kerlix Roll Sterile or Non-Sterile 6-ply 4.5x4 (yd/yd) Discharge Instruction: Apply Kerlix as directed Compression Wrap Compression Stockings Add-Ons total contact cast Electronic Signature(s) Signed: 07/29/2022 12:45:55 PM By: Carlene Coria RN Entered By: Carlene Coria on 07/28/2022 12:56:11 [name], [name] ([id number]) -------------------------------------------------------------------------------- Wound Assessment Details Patient Name: Heather Dear A. Date of Service: 07/28/2022 12:30 PM Medical Record Number: [id number] Patient Account Number: [id number] Date of Birth/Sex: 1976/07/19 (46 y.o. F) Treating RN: Carlene Coria Primary  Care Lynea Rollison: Raelene Bott Other Clinician: Referring Jia Dottavio: Raelene Bott Treating Anberlyn Feimster/Extender: Skipper Cliche in Treatment: 19 Wound Status Wound Number: 12 Primary Diabetic Wound/Ulcer of the Lower Extremity Etiology: Wound Location: Left, Medial, Plantar Foot Wound Open Wounding Event: Pressure Injury Status: Date Acquired: 03/16/2020 Comorbid Chronic sinus problems/congestion, Middle ear problems, Weeks Of Treatment: 19 History: Anemia, Chronic Obstructive Pulmonary Disease (COPD), Clustered Wound: No Congestive Heart Failure, Type II Diabetes, End Stage Renal Disease, History of pressure wounds, Neuropathy Photos Wound Measurements Length: (cm) 1.5 Width: (cm) 0.5 Depth: (cm) 0.1 Area: (cm) 0.589 Volume: (cm) 0.059 % Reduction in Area: 87.5% % Reduction in Volume: 93.7% Epithelialization: Small (1-33%) Tunneling: No Undermining: No Wound Description Classification:  Grade 3 Wound Margin: Flat and Intact Exudate Amount: Medium Exudate Type: Serous Exudate Color: amber Foul Odor After Cleansing: No Slough/Fibrino No Wound Bed Granulation Amount: Large (67-100%) Exposed Structure Granulation Quality: Red, Pink Fascia Exposed: No Necrotic Amount: None Present (0%) Fat Layer (Subcutaneous Tissue) Exposed: Yes Tendon Exposed: No Muscle Exposed: No Joint Exposed: No Bone Exposed: No Treatment Notes Wound #12 (Foot) Wound Laterality: Plantar, Left, Medial Cleanser Soap and Water Discharge Instruction: Gently cleanse wound with antibacterial soap, rinse and pat dry prior to dressing wounds [name], [name] ([id number]) Peri-Wound Care Topical Vitamin AandD Ointment, 4(oz) tube Discharge Instruction: apply to red, broken down area Primary Dressing Apligraf Discharge Instruction: #2 applied today Secondary Dressing ABD Pad 5x9 (in/in) Discharge Instruction: Cover with ABD pad Gauze Discharge Instruction: As directed: dry, moistened with  saline or moistened with Dakins Solution Secured With Medipore Tape - 91M Medipore H Soft Cloth Surgical Tape, 2x2 (in/yd) Kerlix Roll Sterile or Non-Sterile 6-ply 4.5x4 (yd/yd) Discharge Instruction: Apply Kerlix as directed Compression Wrap Compression Stockings Add-Ons total contact cast Electronic Signature(s) Signed: 07/29/2022 12:45:55 PM By: Carlene Coria RN Entered By: Carlene Coria on 07/28/2022 12:56:33 [name], [name] Kitchen ([id number]) -------------------------------------------------------------------------------- Vitals Details Patient Name: Heather Dear A. Date of Service: 07/28/2022 12:30 PM Medical Record Number: [id number] Patient Account Number: [id number] Date of Birth/Sex: 03/02/1976 (46 y.o. F) Treating RN: Carlene Coria Primary Care Jailin Moomaw: Raelene Bott Other Clinician: Referring Liz Pinho: Raelene Bott Treating Mory Herrman/Extender: Skipper Cliche in Treatment: 19 Vital Signs Time Taken: 12:39 Temperature (F): 9+8.2 Height (in): 69 Pulse (bpm): 62 Weight (lbs): 178 Respiratory Rate (breaths/min): 18 Body Mass Index (BMI): 26.3 Blood Pressure (mmHg): 115/58 Reference Range: 80 - 120 mg / dl Electronic Signature(s) Signed: 07/29/2022 12:45:55 PM By: Carlene Coria RN Entered By: Carlene Coria on 07/28/2022 12:39:53

## 2022-07-29 NOTE — Progress Notes (Signed)
[name], [name] ([id number]) Visit Report for 07/28/2022 Chief Complaint Document Details Patient Name: [name], [name] Date of Service: 07/28/2022 12:30 PM Medical Record Number: [id number] Patient Account Number: [id number] Date of Birth/Sex: 1975-12-23 (46 y.o. F) Treating RN: Carlene Coria Primary Care Provider: Raelene Bott Other Clinician: Referring Provider: Raelene Bott Treating Provider/Extender: Skipper Cliche in Treatment: 19 Information Obtained from: Patient Chief Complaint 03/19/2021; patient referred by Dr. Luana Shu who has been looking after her left foot for quite a period of time for review of a nonhealing area in the left midfoot 03/12/2022; bilateral feet wounds and right lower extremity wound. Electronic Signature(s) Signed: 07/28/2022 1:44:50 PM By: Worthy Keeler PA-C Entered By: Worthy Keeler on 07/28/2022 13:44:50 [name] ([id number]) -------------------------------------------------------------------------------- Cellular or Tissue Based Product Details Patient Name: Heather Dear A. Date of Service: 07/28/2022 12:30 PM Medical Record Number: [id number] Patient Account Number: [id number] Date of Birth/Sex: 22-Apr-1976 (46 y.o. F) Treating RN: Carlene Coria Primary Care Provider: Raelene Bott Other Clinician: Referring Provider: Raelene Bott Treating Provider/Extender: Skipper Cliche in Treatment: 19 Cellular or Tissue Based Product Type Wound #12 Left,Medial,Plantar Foot Applied to: Performed By: Physician Tommie Sams., PA-C Cellular or Tissue Based Product Apligraf Type: Level of Consciousness (Pre- Awake and Alert procedure): Pre-procedure Verification/Time Out Yes - 13:10 Taken: Location: genitalia / hands / feet / multiple digits Wound Size (sq cm): 0.75 Product Size (sq cm): 44 Waste Size (sq cm): 24 Waste Reason: size of wound Amount of Product Applied (sq cm): 20 Instrument Used: Blade, Forceps, Scissors Lot #:  [id number] Order #: 2 Expiration Date: 08/03/2022 Fenestrated: Yes Instrument: Blade Reconstituted: Yes Solution Type: normal saline Solution Amount: 61ml Lot #: [id number] Solution Expiration Date: 01/22/2024 Secured: Yes Secured With: Steri-Strips Dressing Applied: Yes Primary Dressing: adaptic Procedural Pain: 0 Post Procedural Pain: 0 Response to Treatment: Procedure was tolerated well Level of Consciousness (Post- Awake and Alert procedure): Post Procedure Diagnosis Same as Pre-procedure Electronic Signature(s) Signed: 07/29/2022 12:45:55 PM By: Carlene Coria RN Entered By: Carlene Coria on 07/28/2022 13:33:59 [name], [name] ([id number]) -------------------------------------------------------------------------------- HPI Details Patient Name: Heather Dear A. Date of Service: 07/28/2022 12:30 PM Medical Record Number: [id number] Patient Account Number: [id number] Date of Birth/Sex: Apr 28, 1976 (46 y.o. F) Treating RN: Carlene Coria Primary Care Provider: Raelene Bott Other Clinician: Referring Provider: Raelene Bott Treating Provider/Extender: Skipper Cliche in Treatment: 19 History of Present Illness HPI Description: 01/18/18-She is here for initial evaluation of the left great toe ulcer. She is a poor historian in regards to timeframe in detail. She states approximately 4 weeks ago she lacerated her toe on something in the house. She followed up with her primary care who placed her on Bactrim and ultimately a second dose of Bactrim prior to coming to wound clinic. She states she has been treating the toe with peroxide, Betadine and a Band-Aid. She did not check her blood sugar this morning but checked it yesterday morning it was 327; she is unaware of a recent A1c and there are no current records. She saw Dr. she would've orthopedics last week for an old injury to the left ankle, she states he did not see her toe, nor did she bring it to his attention. She smokes  approximately 1 pack cigarettes a day. Her social situation is concerning, she arrives this morning with her mother who appears extremely intoxicated/under the influence; her mother was asked to leave the room and be monitored by the patient's grandmother. The patient's aunt then  accompanied the patient and the room throughout the rest of the appointment. We had a lengthy discussion regarding the deleterious effects of uncontrolled hyperglycemia and smoking as it relates to wound healing and overall health. She was strongly encouraged to decrease her smoking and get her diabetes under better control. She states she is currently on a diet and has cut down her [facility] consumption. The left toe is erythematous, macerated and slightly edematous with malodor present. The edema in her left foot is below her baseline, there is no erythema streaking. We will treat her with Santyl, doxycycline; we have ordered and xray, culture and provided a Peg assist surgical shoe and cultured the wound. 01/25/18-She is here in follow-up evaluation for a left great toe ulcer and presents with an abscess to her suprapubic area. She states her blood sugars remain elevated, feeling "sick" and if levels are below 250, but she is trying. She has made no attempt to decrease her smoking stating that we "can't take away her food in her cigarettes". She has been compliant with offloading using the PEG assist you. She is using Santyl daily. the culture obtained last week grew staph aureus and Enterococcus faecalis; continues on the doxycycline and Augmentin was added on Monday. The suprapubic area has erythema, no femoral variation, purple discoloration, minimal induration, was accessed with a cotton tip applicator with sanguinopurulent drainage, this was cultured, I suspect the current antibiotic treatment will cover and we will not add anything to her current treatment plan. She was advised to go to urgent care or ER with any  change in redness, induration or fever. 02/01/18-She is here in follow-up evaluation for left great toe ulcers and a new abdominal abscess from last week. She was able to use packing until earlier this week, where she "forgot it was there". She states she was feeling ill with GI symptoms last week and was not taking her antibiotic. She states her glucose levels have been predominantly less than 200, with occasional levels between 200-250. She thinks this was contributing to her GI symptoms as they have resolved without intervention. There continues to be significant laceration to left toe, otherwise it clinically looks stable/improved. There is now less superficial opening to the lateral aspect of the great toe that was residual blister. We will transition to [facility] to all wounds, she will continue her Augmentin. If there is no change or deterioration next week for reculture. 02/08/18-She is here in follow-up evaluation for left great toe ulcer and abdominal ulcer. There is an improvement in both wounds. She has been wrapping her left toe with coban, not by our direction, which has created an area of discoloration to the medial aspect; she has been advised to NOT use coban secondary to her neuropathy. She states her glucose levels have been high over this last week ranging from 200-350, she continues to smoke. She admits to being less compliant with her offloading shoe. We will continue with same treatment plan and she will follow-up next week. 02/15/18-She is here in follow-up evaluation for left great toe ulcer and abdominal ulcer. The abdominal ulcer is epithelialized. The left great toe ulcer is improved and all injury from last week using the Coban wrap is resolved, the lateral ulcer is healed. She admits to noncompliance with wearing offloading shoe and admits to glucose levels being greater than 300 most of the week. She continues to smoke and expresses no desire to quit. There is one area  medially that probes deeper than  it has historically, erythema to the toe and dorsal foot has consistently waxed and waned. There is no overt signs of cellulitis or infection but we will culture the wound for any occult infection given the new area of depth and erythema. We will hold off on sensitivities for initiation of antibiotic therapy. 02/22/18-She is here in follow up evaluation for left great toe ulcer. There is overall significant improvement in both wound appearance, erythema and edema with changes made last week. She was not initiated on antibiotic therapy. Culture obtained last week showed oxacillin sensitive staph aureus, sensitive to clindamycin. Clindamycin has been called into the pharmacy but she has been instructed to hold off on initiation secondary to overall clinical improvement and her history of antibiotic intolerance. She has been instructed to contact the clinic with any noted changes/deterioration and the wound, erythema, edema and/or pain. She will follow-up next week. She continues to smoke and her glucose levels remain elevated >250; she admits to compliance with offloading shoe 03/01/18 on evaluation today patient appears to be doing fairly well in regard to her left first toe ulcer. She has been tolerating the dressing changes with the [facility] Dressing without complication and overall this has definitely showed signs of improvement according to records as well is what the patient tells me today. I'm very pleased in that regard. She is having no pain today 03/08/18 She is here for follow up evaluation of a left great toe ulcer. She remains non-compliant with glucose control and smoking cessation; glucose levels consistently >200. She states that she got new shoe inserts/peg assist. She admits to compliance with offloading. Since my last evaluation there is significant improvement. We will switch to prisma at this time and she will follow up next week. She is noted to be  tachycardic at this appointment, heart rate 120s; she has a history of heart rate 70-130 according to our records. She admits to extreme agitation r/t personal issues; she was advised to monitor her heartrate and contact her physician if it does not return to a more normal range (<100). She takes cardizem twice daily. 03/15/18-She is here in follow-up evaluation for left great toe ulcer. She remains noncompliant with glucose control and smoking cessation. She admits to compliance with wearing offloading shoe. The ulcer is improved/stable and we will continue with the same treatment plan and she will follow-up next week 03/22/18-She is here for evaluation for left great toe ulcer. There continues to be significant improvement despite recurrent hyperglycemia (over 500 yesterday) and she continues to smoke. She has been compliant with offloading and we will continue with same treatment plan and she will follow-up next week. 03/29/18-She is here for evaluation for left great toe ulcer. Despite continuing to smoke and uncontrolled diabetes she continues to improve. She is compliant with offloading shoe. We will continue with the same treatment plan and she will follow-up next week 04/05/18- She is here in follow up evaluation for a left great toe ulcer; she presents with small pustule to left fifth toe (resembles ant bite). She admits to compliance with wearing offloading shoe; continues to smoke or have uncontrolled blood glucose control. There is more callus than usual with evidence of bleeding; she denies known trauma. 04/12/18-She is here for evaluation of left great toe ulcer. Despite noncompliance with glycemic control and smoking she continues to make [name], [name] ([id number]) improvement. She continues to wear offloading shoe. The pustule, that was identified last week, to the left fifth toe is resolved.  She will follow-up in 2 weeks 05/03/18-she is seen in follow-up evaluation for a left great  toe ulcer. She is compliant with offloading, otherwise noncompliant with glycemic control and smoking. She has plateaued and there is minimal improvement noted. We will transition to [facility], replaced the insert to her surgical shoe and she will follow-up in one week 05/10/18- She is here in follow up evaluation for a left great toe ulcer. It appears stable despite measurement change. We will continue with same treatment plan and follow up next week. 05/24/18-She is seen in follow-up evaluation for a left great toe ulcer. She remains compliant with offloading, has made significant improvement in her diet, decreasing the amount of sugar/soda. She said her recent A1c was 10.9 which is lower than. She did see a diabetic nutritionist/educator yesterday. She continues to smoke. We will continue with the same treatment plan and she'll follow-up next week. 05/31/18- She is seen in follow-up evaluation for left great toe ulcer. She continues to remain compliant with offloading, continues to make improvement in her diet, increasing her water and decreasing the amount of sugar/soda. She does continue to smoke with no desire to quit. We will apply Prisma to the depth and Hydrofera Blue over. We have not received insurance authorization for oasis. She will follow up next week. 06/07/18-She is seen in follow-up evaluation for left great toe ulcer. It has stalled according to today's measurements although base appears stable. She says she saw a diabetic educator yesterday; her average blood sugars are less than 300 which is an improvement for her. She continues to smoke and states "that's my next step" She continues with water over soda. We will order for xray, culture and reinstate ace wrap compression prior to placing apligraf for next week. She is voicing no complaints or concerns. Her dressing will change to iodoflex over the next week in preparation for apligraf. 06/14/18-She is seen in follow-up evaluation  for left great toe ulcer. Plain film x-ray performed last week was negative for osteomyelitis. Wound culture obtained last week grew strep B and OSSA; she is initiated on keflex and cefdinir today; there is erythema to the toe which could be from ace wrap compression, she has a history of wrapping too tight and has has been encouraged to maintain ace wraps that we place today. We will hold off on application of apligraf today, will apply next week after antibiotic therapy has been initiated. She admits today that she has resumed taking a shower with her foot/toe submerged in water, she has been reminded to keep foot/toe out of the bath water. She will be seen in follow up next week 06/21/18-she is seen in follow-up evaluation for left great toe ulcer. She is tolerating antibiotic therapy with no GI disturbance. The wound is stable. Apligraf was applied today. She has been decreasing her smoking, only had 4 cigarettes yesterday and 1 today. She continues being more compliant in diabetic diet. She will follow-up next week for evaluation of site, if stable will remove at 2 weeks. 06/28/18- She is here in follow up evalution. Apligraf was placed last week, she states the dressing fell off on Tuesday and she was dressing with hydrofera blue. She is healed and will be discharged from the clinic today. She has been instructed to continue with smoking cessation, continue monitoring glucose levels, offloading for an additional 4 weeks and continue with hydrofera blue for additional two weeks for any possible microscopic opening. Readmission: 08/07/18 on evaluation today patient presents  for reevaluation concerning the ulcer of her right great toe. She was previously discharged on 06/28/18 healed. Nonetheless she states that this began to show signs of drainage she subsequently went to her primary care provider. Subsequently an x-ray was performed on 08/01/18 which was negative. The patient was also placed on  antibiotics at that time. Fortunately they should have been effective for the infection. Nonetheless she's been experiencing some improvement but still has a lot of drainage coming from the wound itself. 08/14/18 on evaluation today patient's wound actually does show signs of improvement in regard to the erythema at this point. She has completed the antibiotics. With that being said we did discuss the possibility of placing her in a total contact cast as of today although I think that I may want to give this just a little bit more time to ensure nothing recurrence as far as her infection is concerned. I do not want to put in the cast and risk infection at that time if things are not completely resolved. With that being said she is gonna require some debridement today. 08/21/18 on evaluation today patient actually appears to be doing okay in regard to her toe ulcer. She's been tolerating the dressing changes without complication. With that being said it does appear that she is ready and in fact I think it's appropriate for [place] to go ahead and initiate the total contact cast today. Nonetheless she will require some sharp debridement to prepare the wound for application. Overall I feel like things have been progressing well but we do need to do something to get this to close more readily. 08/24/18 patient seen today for reevaluation after having had the total contact cast applied on Tuesday. She seems to have done very well the wound appears to be doing great and overall I'm pleased with the progress that she's made. There were no abnormal areas of rubbing from the cast on her lower extremity. 08/30/18 on evaluation today patient actually appears to be completely healed in regard to her plantar toe ulcer. She tells me at this point she's been having a lot of issues with the cast. She almost fell a couple of times the state shall the step of her dog a couple times as well. This is been a very frustrating  process for her other nonetheless she has completely healed the wound which is excellent news. Overall there does not appear to be the evidence of infection at this time which is great news. 09/11/18 evaluation today patient presents for follow-up concerning her great toe ulcer on the left which has unfortunately reopened since I last saw her which was only a couple of weeks ago. Unfortunately she was not able to get in to get the shoe and potentially the AFO that's gonna be necessary due to her left foot drop. She continues with offloading shoe but this is not enough to prevent her from reopening it appears. When we last had her in the total contact cast she did well from a healing standpoint but unfortunately the wound reopened as soon as she came out of the cast within just a couple of weeks. 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 This is also part of the reason why she falls on a regular basis. I  just do not believe that is gonna be safe for [place] to reinitiate the total contact cast as last time we had this on she fell 3 times one week which is definitely not normal for her. 09/18/18 upon evaluation today the patient actually appears to be doing about the same in regard to her toe ulcer. She did not contact [employer] as I asked her to even though I had given her the prescription. In fact she actually states that she has no idea where the prescription is. She did apparently call [employer] and they told her that all she needed to do was bring the prescription in order to be able to be seen and work on getting the AFO for her left foot. With all that being said she still does not have an appointment and I'm not sure were things stand that regard. I will give her a new prescription today in order to contact  them to get this set up. 09/25/18 on evaluation today patient actually appears to be doing about the same in regard to her toes ulcer. She does have a small areas which seems to have a lot of callous buildup around the edge of the wound which is going to need sharp debridement today. She still is waiting to be scheduled for evaluation with [employer] for possibility of an AFO. She states there supposed to call her tomorrow to get this set up. Unfortunately it does appear that her foot specifically the toe area is showing signs of erythema. There does not appear to be any systemic infection which is in these good news. [name], [name] ([id number]) 10/02/18 on evaluation today patient actually appears to be doing about the same in regard to her toe ulcer. This really has not done too well although it's not significantly larger it's also not significantly smaller. She has been tolerating the dressing changes without complication. She actually has her appointment with [employer] and [place] tomorrow to hopefully be measured for obtaining and AFO splint. I think this would be helpful preventing this from reoccurring. We had contemplated starting the cast this week although to be honest I am reluctant to do that as she's been having nausea, vomiting, and seizure activity over the past three days. She has a history of seizures and have been told is nothing that can be done for these. With that being said I do believe that along with the seizures have the nausea vomiting which upon further questioning doesn't seem to be the normal for her and makes me concerned for the possibility of infection or something else going on. I discussed this with the patient and her mother during the office visit today. I do not feel the wound is effective but maybe something else. The responses this was "this just happens to her at times and we don't know why". They did not seem to be interested in going to the hospital to have this  checked out further. 10/09/18 on evaluation today patient presents for follow-up concerning her ongoing toe ulcer. She has been tolerating the dressing changes without complication. Fortunately there does not appear to be any evidence of infection which is great news however I do think that the patient would benefit from going ahead for with the total contact cast. She's actually in a wheelchair today she tells me that she will use her walker if we initiate the cast. I was very specific about the fact that if we were gonna do the cast I wanted to make sure  that she was using the walker in order to prevent any falls. She tells me she does not have stairs that she has to traverse on a regular basis at her home. She has not had any seizures since last week again that something that happens to her often she tells me she did talk to [employer] and they said that it may take up to three weeks to get the brace approved for her. Hopefully that will not take that long but nonetheless in the meantime I do think the cast could be of benefit. 10/12/18 on evaluation today patient appears to be doing rather well in regard to her toe ulcer. It's just been a few days and already this is significantly improved both as far as overall appearance and size. Fortunately there's no sign of infection. She is here for her first obligatory cast change. 10/19/18 Seen today for follow up and management of left great toe ulcer. Wound continues to show improvement. Noted small open area with seroussang drainage with palpation. Denies any increased pain or recent fevers during visit. She will continue calcium alginate with offloading shoe. Denies any questions or concerns during visit. 10/26/18 on evaluation today patient appears to be doing about the same as when I last saw her in regard to her wound bed. Fortunately there does not appear to be any signs of infection. Unfortunately she continues to have a breakdown in regard to the  toe region any time that she is not in the cast. It takes almost no time at all for this to happen. Nonetheless she still has not heard anything from the brace being made by [employer] as to when exactly this will be available to her. Fortunately there is no signs of infection at this time. 10/30/18 on evaluation today patient presents for application of the total contact cast as we just received him this morning. Fortunately we are gonna be able to apply this to her today which is great news. She continues to have no significant pain which is good news. Overall I do feel like things have been improving while she was the cast is when she doesn't have a cast that things get worse. She still has not really heard anything from [facility] regarding her brace. 11/02/18 upon evaluation today patient's wound already appears to be doing significantly better which is good news. Fortunately there does not appear to be any signs of infection also good news. Overall I do think the total contact cast as before is helping to heal this area unfortunately it's just not gonna likely keep the area closed and healed without her getting her brace at least. Again the foot drop is a significant issue for her. 11/09/18 on evaluation today patient appears to be doing excellent in regard to her toe ulcer which in fact is completely healed. Fortunately we finally got the situation squared away with the paperwork which was needed to proceed with getting her brace approved by Medicaid. I have filled that out unfortunately that information has been sent to the orthopedic office that I worked at 2 1/2 years ago and not tired Current wound care measures. Fortunately she seems to be doing very well at this time. 11/23/18 on evaluation today patient appears to be doing More Poorly Compared to Last Time I Saw Her. At [facility] She Had Completely Healed. Currently she is continuing to have issues with reopening. She states that she just found  out that the brace was approved through [place] now she just  has to go get measured in order to have this fitted for her and then made. Subsequently she does not have an appointment for this yet that is going to complicate things we obviously cannot put her back in the cast if we do not have everything measured because they're not gonna be able to measure her foot while she is in the cast. 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 Obviously this is unfortunate and does have me somewhat worried as well. 11/30/18 on evaluation today patient's toe ulcer actually appears to be doing fairly well. The good news is she will be getting her brace in the shoes next week on Wednesday. Hopefully we will be able to get this to heal without having to go back in the cast however she may need the cast in order to get the wound completely heal and then go from there. Fortunately there's no signs of infection at this time. 12/07/18 on evaluation today patient fortunately did receive her brace and she states she could tell this definitely makes her walk better. With that being said she's been having issues with her toe where she noticed yesterday there was a lot of tissue that was loosing off this appears to be much larger than what it was previous. She also states that her leg has been read putting much across the top of her foot just about the ankle although this seems to be receiving somewhat. The total area is still red and appears to be someone infected as best I can tell. She is previously taken Bactrim and that may be a good option for her today as well. We are gonna see what I wound culture shows as well and I think that this is definitely appropriate. With that being said outside of the culture I still need to initiate something in the interim and that's what I'm gonna go ahead and select Bactrim is a good option  for her. 12/14/18 on evaluation today patient appears to be doing better in regard to her left great toe ulcer as compared to last week's evaluation. There's still some erythema although this is significantly improved which is excellent news. Overall I do believe that she is making good progress is still gonna take some time before she is where I would like her to be from the standpoint of being able to place her back into the total contact cast. Hopefully we will be where we need to be by next week. 12/21/18 on evaluation today patient actually appears to be doing poorly in regard to her toe ulcer. She's been tolerating the dressing changes without complication. Fortunately there's no signs of systemic infection although she does have a lot of drainage from the toe ulcer and this does seem to be causing some issues at this point. She does have erythema on the distal portion of her toe that appears to be likely cellulitis. 12/28/18 on evaluation today patient actually appears to be doing a little better in my pinion in regard to her toe ulcer. With that being said she still does have some evidence of infection at this time and for her culture she had both E. coli as well as enterococcus as organisms noted on evaluation. For that reason I think that though the Keflex likely has treated the E. coli rather well this has really done nothing for the enterococcus. We are going to  have to initiate treatment for this specifically. [name], [name] ([id number]) 01/04/19 on evaluation today patient's toe actually appears to be doing better from the standpoint of infection. She currently would like to see about putting the cash back on I think that this is appropriate as long as she takes care of it and keeps it from getting wet. She is gonna have some drainage we can definitely pass this up with Drawtex and alginate to try to prevent as much drainage as possible from causing the problems. With that being said I do  want to at least try her with the cast between now and Tuesday. If there any issues we can't continue to use it then I will discontinue the use of the cast at that point. 01/08/19 on evaluation today patient actually appears to be doing very well as far as her foot ulcer specifically the great toe on the left is concerned. She did have an area of rubbing on the medial aspect of her left ankle which again is from the cast. Fortunately there's no signs of infection at this point in this appears to be a very slight skin breakdown. The patient tells me she felt it rubbing but didn't think it was that bad. Fortunately there is no signs of active infection at this time which is good news. No fevers, chills, nausea, or vomiting noted at this time. 01/15/19 on evaluation today patient actually appears to be doing well in regard to her toe ulcer. Again as previous she seems to do well and she has the cast on which indicates to me that during the time she doesn't have a cast on she's putting way too much pressure on this region. Obviously I think that's gonna be an issue as with the current national emergency concerning the Covid-19 Virus it has been recommended that we discontinue the use of total contact casting by the chief medical officer of our company, Dr. Simona Huh. The reasoning is that if a patient becomes sick and cannot come into have the cast removed they could not just leave this on for an additional two weeks. Obviously the hospitals also do not want to receive patient's who are sick into the emergency department to potentially contaminate the region and spread the Covid-19 Virus among other sick individuals within the hospital system. Therefore at this point we are suspending the use of total contact cast until the current emergency subsides. This was all discussed with the patient today as well. 01/22/19 on evaluation today patient's wound on her left great toe appears to be doing slightly worse than  previously noted last week. She tells me that she has been on this quite a bit in fact she tells me she's been awake for 38 straight hours. This is due to the fact that she's having to care for grandparents because nobody else will. She has been taking care of them for five the last seven days since I've seen her they both have dementia his is from a stroke and her grandmother's was progressive. Nonetheless she states even her mom who knows her condition and situation has only help two of those days to take care of them she's been taking care of the rest. Fortunately there does not appear to be any signs of active infection in regard to her toe at this point although obviously it doesn't look as good as it did previous. I think this is directly related to her not taking off the pressure and friction by way of  taking things easy. Though I completely understand what's going on. 01/29/19 on evaluation today patient's tools are actually appears to be showing some signs of improvement today compared to last week's evaluation as far as not necessarily the overall size of the wound but the fact that she has some new skin growth in between the two ends of the wound opening. Overall I feel like she has done well she states that she had a family member give her what sounds to be a CAM walker boot which has been helpful as well. 02/05/19 on evaluation today patient's wound bed actually appears to be doing significantly better in regard to her overall appearance of the size of the wound. With that being said she is still having an issue with offloading efficiently enough to get this to close. Apparently there is some signs of infection at this point as well unfortunately. Previously she's done well of Augmentin I really do not see anything that needs to be culture currently but there theme and cellulitis of the foot that I'm seeing I'm gonna go ahead and place her on an antibiotic today to try to help clear  this up. 02/12/2019 on evaluation today patient actually appears to be doing poorly in regard to her overall wound status. She tells me she has been using her offloading shoe but actually comes in today wearing her tennis shoe with the AFO brace. Again as I previously discussed with her this is really not sufficient to allow the area to heal appropriately. Nonetheless she continues to be somewhat noncompliant and I do wonder based on what she has told my nurse in the past as to whether or not a good portion of this noncompliance may be recreational drug and alcohol related. She has had a history of heroin overdose and this was fairly recently in the past couple of months that have been seeing her. Nonetheless overall I feel like her wound looks significantly worse today compared to what it was previous. She still has significant erythema despite the Augmentin I am not sure that this is an appropriate medication for her infection I am also concerned that the infection may have gone down into her bone. 02/19/19 on evaluation today patient actually appears to be doing about the same in regard to her toe ulcer. Unfortunately she continues to show signs of bone exposure and infection at this point. There does not appear to be any evidence of worsening of the infection but I'm also not really sure that it's getting significantly better. She is on the Augmentin which should be sufficient for the Staphylococcus aureus infection that she has at this point. With that being said she may need IV antibiotics to more appropriately treat this. We did have a discussion today about hyperbaric option therapy. 02/28/19 on evaluation today patient actually appears to be doing much worse in regard to the wound on her left great toe as compared to even my previous evaluation last week. Unfortunately this seems to be training in a pretty poor direction. Her toe was actually now starting to angle laterally and I can actually  see the entire joint area of the proximal portion of the digit where is the distal portion of the digit again is no longer even in contact with the joint line. Unfortunately there's a lot more necrotic tissue around the edge and the toe appears to be showing signs of becoming gangrenous in my pinion. I'm very concerned about were things stand at this point. She did  see infectious disease and they are planning to send in a prescription for Sivextro for her and apparently this has been approved. With that being said I don't think she should avoid taking this but at the same time I'm not sure that it's gonna be sufficient to save her toe at this point. 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 03/05/19 on evaluation today patient actually appears to be doing quite well in regard to her toe all things considering. She still has bone exposed but there appears to be much less your thing on overall the appearance of the wound and the toe itself is dramatically improved. She still does have some issues currently obviously with infection she did see vascular as well and there concerned that her blood flow to the toad. For that reason they are setting up for an angiogram next week. 03/14/19 on evaluation today patient appears to be doing very poor in regard to her toe and specifically in regard to the ulceration and the fact that she's starting to notice the toe was leaning even more towards the lateral aspect and the complete joint is visible on the proximal aspect of the joint. Nonetheless she's also noted a significant odor and the tip of the toe is turning more dark and necrotic appearing. Overall I think she is getting worse not better as far as this is concerned. For that reason I am recommending at this  point that she likely needs to be seen for likely amputation. READMISSION 03/19/2021 This is a patient that we cared for in this clinic for a prolonged period of time in 2019 and 2020 with a left foot and left first toe wound. 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 ([id number]) 04/09/20 by Dr. Luana Shu. In December 2021 she had an ulcer on her right great toe as well as the fourth and fifth toes. 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 She also had an angiogram at that time and underwent angioplasty of the right anterior tibial artery. In any case she claims that the wound on the right foot is closed I did not look at this today which was probably an oversight although I think that should be done next week. After her surgery she developed a dehiscence but I do not see any follow-up of this. According to Dr. Deborra Medina last review that she was out of the area being cared for by another physician but recently came back to his attention. 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 A culture of this area showed E. coli apparently before she came back to see Dr. Luana Shu she was supposed to be receiving antibiotics but she did not really take them. Nor is she offloading this area at all. Finally her last hemoglobin A1c listed in epic was in March 2022 at 14.1 she says things are a lot better since then although I am not sure. 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 She had this wound at the time. During this admission she had E. coli sepsis a TEE was negative.  Past medical history is extensive and includes type 2 diabetes with peripheral neuropathy cardiomyopathy with an ejection fraction of 33%, hypertension, hyperlipidemia chronic renal failure stage  III history of substance abuse with cocaine although she claims to be clean now verified by her mother. She is still a heavy cigarette smoker. She has a history of bipolar disorder seizure disorder ABI in our clinic was 1.05 6/1; left midfoot in the setting of a TMA done previously. Round circular wound with a "knuckle" of protruding tissue. The problem is that the knuckle was not attached to any of the surrounding granulation and this probed proximally widely I removed a large portion of this tissue. This wound goes with considerable undermining laterally. I do not feel any bone there was no purulence but this is a deep wound. 6/8; in spite of the debridement I did last week. She arrives with a wound looking exactly the same. A protruding "knuckle" of tissue nonadherent to most of the surrounding tissue. There is considerable depth around this from 6-12 o'clock at 2.7 cm and undermining of 1 cm. This does not look overtly infected and the x-ray I did last week was negative for any osseous abnormalities. We have been using silver collagen 6/15; deep tissue culture I did last week showed moderate staph aureus and moderate Pseudomonas. This will definitely require prolonged antibiotic therapy. The pathology on the protuberant area was negative for malignancy fungus etc. 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 We have been using silver collagen. I am going to be prescribing Levaquin for 2 weeks. Her CT scan of the foot is down for 7/5 6/22; CT scan of the foot on 7 5. She says she has hardware in the left leg from her previous fracture. She is on the Levaquin for the deep tissue culture I did that showed methicillin sensitive staph aureus and Pseudomonas. I gave her a 2-week supply and she will have another week. She arrives in clinic today with the same protuberant tissue however this is nonadherent to the tissue surrounding it. I am really  at a loss to explain this unless there is underlying deep tissue infection 6/29; patient presents for 1 week follow-up. She has been using collagen to the wound bed. She reports taking her antibiotics as prescribed.She has no complaints or issues today. She denies signs of infection. 7/6; patient presents for one week followup. She has been using collagen to the wound bed. She states she is taking Levaquin however at times she is not able to keep it down. She denies signs of infection. 7/13; patient presents for 1 week follow-up. She has been using silver alginate to the wound bed. She still has nausea when taking her antibiotics. She denies signs of infection. 7/20; patient presents for 1 week follow-up. She has been using silver alginate with gentamicin cream to the wound bed. She denies any issues and has no complaints today. She denies signs of infection. 7/27; patient presents for 1 week follow-up. She continues to use silver alginate with gentamicin cream to the wound bed. She reports starting her antibiotics. She has no issues or complaints. Overall she reports stability to the wound. 8/3; patient presents for 1 week follow-up. She has been using silver alginate with gentamicin cream to the wound bed. She reports completing all antibiotics. She has no issues or complaints today. She denies signs of infection. 8/17; patient presents for 2-week follow-up. He is to use silver alginate to the  wound bed. She has no issues or complaints today. She denies signs of infection. She reports her pain has improved in her foot since last clinic visit 8/24; patient presents for 1 week follow-up. She continues to use silver alginate to the wound bed. She has no issues or complaints. She denies signs of infection. Pain is stable. 9/7; patient presents for follow-up. She missed her last week appointment due to feeling ill. She continues to use silver alginate. She has a new wound to the right lower extremity  that is covered in eschar. She states It occurred over the past week and has no idea how it started. She currently denies signs of infection. 9/14; patient presents for follow-up. To the left foot wound she has been using gentamicin cream and silver alginate. To the right lower extremity wound she has been keeping this covered and has not obtain Santyl. 9/21; patient presents for follow-up. She reports using gentamicin cream and silver alginate to the left foot and Santyl to the right lower extremity wound. She has no issues or complaints today. She denies signs of infection. 9/28; patient presents for follow-up. She reports a new wound to her right heel. She states this occurred a few days ago and is progressively gotten worse. She has been trying to clean the area with a Q-tip and Santyl. She reports stability in the other 2 wounds. She has been using gentamicin cream and silver alginate to the left foot and Santyl to the right lower extremity wound. 10/12; patient presents for follow-up. She reports improvement to the wound beds. She is seeing vein and vascular to discuss the potential of a left BKA. She states they are going to do an arteriogram. She continues to use silver alginate with dressing changes to her wounds. 11/2; patient presents for follow-up. She states she has not been doing dressing changes to the wound beds. She states she is not able to offload the areas. She reports chronic pain to her left foot wound. 11/9; patient presents for follow-up. She came in with only socks on. She states she forgot to put on shoes. It is unclear if she is doing any dressing changes. She currently denies systemic signs of infection. 11/16; patient presents for follow-up. She came again only with socks on. She states she does not wear shoes ever. It is unclear if she does dressing changes. She currently denies systemic signs of infection. 11/23; patient presents for follow-up. She wore her shoes  today. It still unclear exactly what dressing she is using for each wound but she did states she obtained Dakin's solution and has been using this to the left foot wound. She currently denies signs of infection. 11/30; patient presents for follow-up. She has no issues or complaints today. She currently denies signs of infection. 12/7; patient presents for follow-up. She has no issues or complaints today. She has been using Hydrofera Blue to the right heel wound and Dakin solution to the left foot wound. Her right anterior leg wound is healed. She currently denies signs of infection. [name], [name] ([id number]) 12/14; patient presents for follow-up. She has been using Hydrofera Blue to the right heel and Dakin's to the left foot wounds. She has no issues or complaints today. She denies signs of infection. 12/21; patient presents for follow-up. She reports using Hydrofera Blue to the right heel and Dakin's to the left foot wound. She denies signs of infection. 12/28; patient presents for follow-up. She continues to use Dakin's to the left  foot wound and Hydrofera Blue to the right heel wound. She denies signs of infection. 1/4; patient presents for follow-up. She has no issues or complaints today. She denies signs of infection. 1/11; patient presents for follow-up. It is unclear if she has been dressing these wounds over the past week. She currently denies signs of infection. 1/18; patient presents for follow-up. She states she has been using Dakin's wet-to-dry dressings to the left foot. She has been using Hydrofera Blue to the right foot foot wound. She states that the anterior right leg wound has reopened and draining serous fluid. She denies signs of infection. 1/25; patient presents for follow-up. She has no issues or complaints today. 2/1; patient presents for follow-up. She has no issues or complaints today. She denies signs of infection. 2/8; patient presents for follow-up. She has lost  her surgical shoes. She did not have a dressing to the right heel wound. She currently denies signs of infection. 2/15; patient presents for follow-up. She reports more pain to the right heel today. She denies purulent drainage Or fever/chills 2/22; patient presents for follow-up. She reports taking clindamycin over the past week. She states that she continues to have pain to her right heel. She reports purulent drainage. Readmission 03/16/2022 Ms. Nakota Ackert is a 46 year old female with a past medical history of type 2 diabetes, osteomyelitis to her feet, chronic systolic heart failure and bipolar disorder that presents to the clinic for bilateral feet wounds and right lower extremity wound. She was last seen in our clinic on 12/15/2021. At that time she had purulent drainage coming out of her right plantar foot and I recommended she go to the ED. She states she went to [facility] and has been there for the past 3 months. I cannot see the records. She states she had OR debridement and was on several weeks of IV antibiotics while inpatient. Since discharge she has not been taking care of the wound beds. She had nothing on her feet other than socks today. She currently denies signs of infection. 5/31; patient presents for follow-up. She has been using Dakin's wet-to-dry dressings to the wound beds on her feet bilaterally and antibiotic ointment to the right anterior leg wound. She had a wound culture done at last clinic visit that showed moderate Pseudomonas aeruginosa sensitive to ciprofloxacin. She currently denies systemic signs of infection. 6/14; patient presents for follow-up. She received Keystone 5 days ago and has been using this on the wound beds. She states that last week she had to go to the hospital because she had increased warmth and erythema to the right foot. She was started on 2 oral antibiotics. She states she has been taking these. She currently denies systemic signs  of infection. She has no issues or complaints today. 6/21; patient presents for follow-up. She states she has been using Keystone antibiotics to the wound beds. She has no issues or complaints today. She denies signs of infection. 6/28; patient presents for follow-up. She has been using Keystone antibiotics to the wound beds. She has no issues or complaints today. 7/12; patient presents for follow-up. Has been using Keystone antibiotics to the wound beds with calcium alginate. She has no issues or complaints today. She never followed up with her orthopedic surgeon who did the OR debridement to the right foot. We discussed the total contact cast for the left foot and patient would like to do this next week. 7/19; patient presents for follow-up. She has been using Keystone  antibiotics with calcium alginate to the wound beds. She has no issues or complaints today. Patient is in agreement to do the total contact cast of the left foot today. She knows to return later this week for the obligatory cast change. 05-13-2022 upon evaluation today patient's wound which she has the cast of the left leg actually appears to be doing significantly better. Fortunately I do not see any signs of active infection locally or systemically which is great news and overall I am extremely pleased with where we stand currently. 7/26; patient presents for follow-up. She has a cast in place for the past week. She states it irritated her shin. Other than that she tolerated the cast well. She states she would like a break for 1 week from the cast. We have been using Keystone antibiotic and Aquacel to both wound beds. She denies signs of infection. 8/2; patient presents for follow-up. She has been using Keystone and Aquacel to the wound beds. She denies any issues and has no complaints. She is agreeable to have the cast placed today for the left leg. 06-03-2022 upon evaluation today patient appears to be doing well with regard to  her wound she saw some signs of improvement which is great news. Fortunately I do not see any evidence of active infection locally or systemically at this time which is great news. No fevers, chills, nausea, vomiting, or diarrhea. 8/16; patient presents for follow-up. She has no issues or complaints today. We have been using Keystone and Aquacel to the wound beds. The left lower extremity is in a total contact cast. She is tolerated this well. 8/23; patient presents for follow-up. She has had the total contact cast on the left leg for the past week. Unfortunately this has rubbed and broken down the skin to the medial foot. She currently denies signs of infection. She has been using Keystone antibiotic to the right plantar foot wound. TERISA, BELARDO A. ([id number]) 8/30; patient presents for follow-up. We have held off on the total contact cast for the left leg for the past week. Her wound on the left foot has improved and the previous surrounding breakdown of skin has epithelialized. She has been using Keystone antibiotic to both wound beds. She has no issues or complaints today. She denies signs of infection. 9/6; patient presents for follow-up. She has ordered her's Keystone antibiotic and this is arriving this week. She has been doing Dakin's wet-to- dry dressings to the wound beds. She denies signs of infection. She is agreeable to the total contact cast today. 9/13; patient presents for follow-up. She states that the cast caused her left leg shin to be sore. She would like to take a break from the cast this week. She has been using Keystone antibiotic to the right plantar foot wound. She denies signs of infection. 9/20; patient presents for follow-up. She has been using Keystone antibiotics to the wound beds with calcium alginate to the right foot wound and Hydrofera Blue to the left foot wound. She is agreeable to having the cast placed today. She has been approved for Apligraf and we will  order this for next clinic visit. 9/27; patient presents for follow-up. We have been using Keystone antibiotic with Hydrofera Blue to the left foot wound under a total contact cast. To the right foot wound she has been using Keystone antibiotic and calcium alginate. She declines a total contact cast today. Apligraf is available for placement and she would like to proceed with this. 07-28-2022  upon evaluation today patient appears to be doing well currently in regard to her wound. She is actually showing signs of significant improvement which is great news. Fortunately I do not see any evidence of active infection locally nor systemically at this time. She has been seeing Dr. Heber [place] and to be honest has been doing very well with the cast. Subsequently she comes in today with a cast on and we did reapply that today as well. She did not really want to she try to talk me out of that but I explained that if she wanted to heal this is really the right way to go. Patient voiced understanding. In regard to her right foot this is actually a lot better compared to the last time I saw her which is also great news. Electronic Signature(s) Signed: 07/28/2022 1:58:41 PM By: Worthy Keeler PA-C Entered By: Worthy Keeler on 07/28/2022 13:58:41 [name] ([id number]) -------------------------------------------------------------------------------- Physical Exam Details Patient Name: Heather Dear A. Date of Service: 07/28/2022 12:30 PM Medical Record Number: [id number] Patient Account Number: [id number] Date of Birth/Sex: 24-May-1976 (46 y.o. F) Treating RN: Carlene Coria Primary Care Provider: Raelene Bott Other Clinician: Referring Provider: Raelene Bott Treating Provider/Extender: Skipper Cliche in Treatment: 47 Constitutional Well-nourished and well-hydrated in no acute distress. Respiratory normal breathing without difficulty. Psychiatric this patient is able to make decisions and  demonstrates good insight into disease process. Alert and Oriented x 3. pleasant and cooperative. Notes Upon inspection patient's wound bed actually showed signs of good granulation and epithelization at this point. Fortunately I do not see any evidence of active infection locally or systemically which is great news. I therefore did go ahead and reapply the Apligraf today which I think will also benefit her. Overall I think that she is moving in the right direction. Electronic Signature(s) Signed: 07/28/2022 2:04:01 PM By: Worthy Keeler PA-C Entered By: Worthy Keeler on 07/28/2022 14:04:01 [name] ([id number]) -------------------------------------------------------------------------------- Physician Orders Details Patient Name: Heather Dear A. Date of Service: 07/28/2022 12:30 PM Medical Record Number: [id number] Patient Account Number: [id number] Date of Birth/Sex: 12-16-1975 (46 y.o. F) Treating RN: Carlene Coria Primary Care Provider: Raelene Bott Other Clinician: Referring Provider: Raelene Bott Treating Provider/Extender: Skipper Cliche in Treatment: 72 Verbal / Phone Orders: No Diagnosis Coding Follow-up Appointments o Return Appointment in 1 week. o Nurse Visit as needed Bathing/ Shower/ Hygiene o May shower with wound dressing protected with water repellent cover or cast protector. o No tub bath. Anesthetic (Use 'Patient Medications' Section for Anesthetic Order Entry) o Lidocaine applied to wound bed Cellular or Tissue Based Products o Cellular or Tissue Based Product Type: - apligraf #2 applied to left foot Edema Control - Lymphedema / Segmental Compressive Device / Other o Elevate, Exercise Daily and Avoid Standing for Long Periods of Time. o Elevate legs to the level of the heart and pump ankles as often as possible o Elevate leg(s) parallel to the floor when sitting. Off-Loading o Total Contact Cast to Left Lower Extremity -  contact cast #8 applied Don't get cast wet o Other: - keep pressure off of feet. Medications-Please add to medication list. o Keystone Compound - not available to apply this visit 07/28/22 Keystone gel right foot Wound Treatment Wound #11 - Foot Wound Laterality: Plantar, Right Cleanser: Soap and Water Every Other Day/30 Days Discharge Instructions: Gently cleanse wound with antibacterial soap, rinse and pat dry prior to dressing wounds Topical: keystone gel Every Other Day/30 Days Primary Dressing:  Aquacel Extra Hydrofiber Dressing, 2x2 (in/in) Every Other Day/30 Days Secondary Dressing: ABD Pad 5x9 (in/in) (Generic) Every Other Day/30 Days Discharge Instructions: Cover with ABD pad Secured With: Medipore Tape - 68M Medipore H Soft Cloth Surgical Tape, 2x2 (in/yd) (Generic) Every Other Day/30 Days Secured With: Kerlix Roll Sterile or Non-Sterile 6-ply 4.5x4 (yd/yd) (Generic) Every Other Day/30 Days Discharge Instructions: Apply Kerlix as directed Add-Ons: total contact cast Every Other Day/30 Days Wound #12 - Foot Wound Laterality: Plantar, Left, Medial Cleanser: Soap and Water 1 x Per Week/30 Days Discharge Instructions: Gently cleanse wound with antibacterial soap, rinse and pat dry prior to dressing wounds Topical: Vitamin AandD Ointment, 4(oz) tube 1 x Per Week/30 Days Discharge Instructions: apply to red, broken down area Primary Dressing: Apligraf 1 x Per Week/30 Days Discharge Instructions: #2 applied today Hann, Elasia A. ([id number]) Secondary Dressing: ABD Pad 5x9 (in/in) (Generic) 1 x Per Week/30 Days Discharge Instructions: Cover with ABD pad Secondary Dressing: Gauze 1 x Per Week/30 Days Discharge Instructions: As directed: dry, moistened with saline or moistened with Dakins Solution Secured With: Medipore Tape - 68M Medipore H Soft Cloth Surgical Tape, 2x2 (in/yd) (Generic) 1 x Per Week/30 Days Secured With: [employer] Sterile or Non-Sterile 6-ply 4.5x4 (yd/yd)  (Generic) 1 x Per Week/30 Days Discharge Instructions: Apply Kerlix as directed Add-Ons: total contact cast 1 x Per Week/30 Days Electronic Signature(s) Signed: 07/28/2022 2:50:44 PM By: Worthy Keeler PA-C Signed: 07/29/2022 12:45:55 PM By: Carlene Coria RN Entered By: Carlene Coria on 07/28/2022 13:07:51 Raabe, Christyl A. ([id number]) -------------------------------------------------------------------------------- Problem List Details Patient Name: Heather Dear A. Date of Service: 07/28/2022 12:30 PM Medical Record Number: [id number] Patient Account Number: [id number] Date of Birth/Sex: 06/24/1976 (46 y.o. 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 ([id number]) -------------------------------------------------------------------------------- Progress Note Details Patient Name: Heather Dear A. Date of Service: 07/28/2022 12:30 PM Medical Record Number: [id number] Patient Account Number: [id number] Date of Birth/Sex: 12-20-1975 (46 y.o. F) Treating RN: Carlene Coria Primary Care Provider: Raelene Bott Other  Clinician: Referring Provider: Raelene Bott Treating Provider/Extender: Skipper Cliche in Treatment: 19 Subjective Chief Complaint Information obtained from Patient 03/19/2021; patient referred by Dr. Luana Shu who has been looking after her left foot for quite a period of time for review of a nonhealing area in the left midfoot 03/12/2022; bilateral feet wounds and right lower extremity wound. History of Present Illness (HPI) 01/18/18-She is here for initial evaluation of the left great toe ulcer. She is a poor historian in regards to timeframe in detail. She states approximately 4 weeks ago she lacerated her toe on something in the house. She followed up with her primary care who placed her on Bactrim and ultimately a second dose of Bactrim prior to coming to wound clinic. She  states she has been treating the toe with peroxide, Betadine and a Band-Aid. She did not check her blood sugar this morning but checked it yesterday morning it was 327; she is unaware of a recent A1c and there are no current records. She saw Dr. she would've orthopedics last week for an old injury to the left ankle, she states he did not see her toe, nor did she bring it to his attention. She smokes approximately 1 pack cigarettes a day. Her social situation is concerning, she arrives this morning with her mother who appears extremely intoxicated/under the influence; her mother was asked to leave the room and be monitored by the patient's grandmother. The patient's aunt then accompanied the patient and the room throughout the rest of the appointment. We had a lengthy discussion regarding the deleterious effects of uncontrolled hyperglycemia and smoking as it relates to wound healing and overall health. She was strongly encouraged to decrease her smoking and get her diabetes under better control. She states she is currently on a diet and has cut down her [facility] consumption. The left toe is erythematous, macerated and  slightly edematous with malodor present. The edema in her left foot is below her baseline, there is no erythema streaking. We will treat her with Santyl, doxycycline; we have ordered and xray, culture and provided a Peg assist surgical shoe and cultured the wound. 01/25/18-She is here in follow-up evaluation for a left great toe ulcer and presents with an abscess to her suprapubic area. She states her blood sugars remain elevated, feeling "sick" and if levels are below 250, but she is trying. She has made no attempt to decrease her smoking stating that we "can't take away her food in her cigarettes". She has been compliant with offloading using the PEG assist you. She is using Santyl daily. the culture obtained last week grew staph aureus and Enterococcus faecalis; continues on the doxycycline and Augmentin was added on Monday. The suprapubic area has erythema, no femoral variation, purple discoloration, minimal induration, was accessed with a cotton tip applicator with sanguinopurulent drainage, this was cultured, I suspect the current antibiotic treatment will cover and we will not add anything to her current treatment plan. She was advised to go to urgent care or ER with any change in redness, induration or fever. 02/01/18-She is here in follow-up evaluation for left great toe ulcers and a new abdominal abscess from last week. She was able to use packing until earlier this week, where she "forgot it was there". She states she was feeling ill with GI symptoms last week and was not taking her antibiotic. She states her glucose levels have been predominantly less than 200, with occasional levels between 200-250. She thinks this was contributing to her GI symptoms as they have resolved without intervention. There continues to be significant laceration to left toe, otherwise it clinically looks stable/improved. There is now less superficial opening to the lateral aspect of the great toe that was residual  blister. We will transition to [facility] to all wounds, she will continue her Augmentin. If there is no change or deterioration next week for reculture. 02/08/18-She is here in follow-up evaluation for left great toe ulcer and abdominal ulcer. There is an improvement in both wounds. She has been wrapping her left toe with coban, not by our direction, which has created an area of discoloration to the medial aspect; she has been advised to NOT use coban secondary to her neuropathy. She states her glucose levels  have been high over this last week ranging from 200-350, she continues to smoke. She admits to being less compliant with her offloading shoe. We will continue with same treatment plan and she will follow-up next week. 02/15/18-She is here in follow-up evaluation for left great toe ulcer and abdominal ulcer. The abdominal ulcer is epithelialized. The left great toe ulcer is improved and all injury from last week using the Coban wrap is resolved, the lateral ulcer is healed. She admits to noncompliance with wearing offloading shoe and admits to glucose levels being greater than 300 most of the week. She continues to smoke and expresses no desire to quit. There is one area medially that probes deeper than it has historically, erythema to the toe and dorsal foot has consistently waxed and waned. There is no overt signs of cellulitis or infection but we will culture the wound for any occult infection given the new area of depth and erythema. We will hold off on sensitivities for initiation of antibiotic therapy. 02/22/18-She is here in follow up evaluation for left great toe ulcer. There is overall significant improvement in both wound appearance, erythema and edema with changes made last week. She was not initiated on antibiotic therapy. Culture obtained last week showed oxacillin sensitive staph aureus, sensitive to clindamycin. Clindamycin has been called into the pharmacy but she has been  instructed to hold off on initiation secondary to overall clinical improvement and her history of antibiotic intolerance. She has been instructed to contact the clinic with any noted changes/deterioration and the wound, erythema, edema and/or pain. She will follow-up next week. She continues to smoke and her glucose levels remain elevated >250; she admits to compliance with offloading shoe 03/01/18 on evaluation today patient appears to be doing fairly well in regard to her left first toe ulcer. She has been tolerating the dressing changes with the [facility] Dressing without complication and overall this has definitely showed signs of improvement according to records as well is what the patient tells me today. I'm very pleased in that regard. She is having no pain today 03/08/18 She is here for follow up evaluation of a left great toe ulcer. She remains non-compliant with glucose control and smoking cessation; glucose levels consistently >200. She states that she got new shoe inserts/peg assist. She admits to compliance with offloading. Since my last evaluation there is significant improvement. We will switch to prisma at this time and she will follow up next week. She is noted to be tachycardic at this appointment, heart rate 120s; she has a history of heart rate 70-130 according to our records. She admits to extreme agitation r/t personal issues; she was advised to monitor her heartrate and contact her physician if it does not return to a more normal range (<100). She takes cardizem twice daily. 03/15/18-She is here in follow-up evaluation for left great toe ulcer. She remains noncompliant with glucose control and smoking cessation. She admits to compliance with wearing offloading shoe. The ulcer is improved/stable and we will continue with the same treatment plan and she will follow-up next week 03/22/18-She is here for evaluation for left great toe ulcer. There continues to be significant  improvement despite recurrent hyperglycemia (over 500 yesterday) and she continues to smoke. She has been compliant with offloading and we will continue with same treatment plan and she will follow-up next week. Haft, Esli A. ([id number]) 03/29/18-She is here for evaluation for left great toe ulcer. Despite continuing to smoke and uncontrolled diabetes she continues  to improve. She is compliant with offloading shoe. We will continue with the same treatment plan and she will follow-up next week 04/05/18- She is here in follow up evaluation for a left great toe ulcer; she presents with small pustule to left fifth toe (resembles ant bite). She admits to compliance with wearing offloading shoe; continues to smoke or have uncontrolled blood glucose control. There is more callus than usual with evidence of bleeding; she denies known trauma. 04/12/18-She is here for evaluation of left great toe ulcer. Despite noncompliance with glycemic control and smoking she continues to make improvement. She continues to wear offloading shoe. The pustule, that was identified last week, to the left fifth toe is resolved. She will follow-up in 2 weeks 05/03/18-she is seen in follow-up evaluation for a left great toe ulcer. She is compliant with offloading, otherwise noncompliant with glycemic control and smoking. She has plateaued and there is minimal improvement noted. We will transition to [facility], replaced the insert to her surgical shoe and she will follow-up in one week 05/10/18- She is here in follow up evaluation for a left great toe ulcer. It appears stable despite measurement change. We will continue with same treatment plan and follow up next week. 05/24/18-She is seen in follow-up evaluation for a left great toe ulcer. She remains compliant with offloading, has made significant improvement in her diet, decreasing the amount of sugar/soda. She said her recent A1c was 10.9 which is lower than. She did see a  diabetic nutritionist/educator yesterday. She continues to smoke. We will continue with the same treatment plan and she'll follow-up next week. 05/31/18- She is seen in follow-up evaluation for left great toe ulcer. She continues to remain compliant with offloading, continues to make improvement in her diet, increasing her water and decreasing the amount of sugar/soda. She does continue to smoke with no desire to quit. We will apply Prisma to the depth and Hydrofera Blue over. We have not received insurance authorization for oasis. She will follow up next week. 06/07/18-She is seen in follow-up evaluation for left great toe ulcer. It has stalled according to today's measurements although base appears stable. She says she saw a diabetic educator yesterday; her average blood sugars are less than 300 which is an improvement for her. She continues to smoke and states "that's my next step" She continues with water over soda. We will order for xray, culture and reinstate ace wrap compression prior to placing apligraf for next week. She is voicing no complaints or concerns. Her dressing will change to iodoflex over the next week in preparation for apligraf. 06/14/18-She is seen in follow-up evaluation for left great toe ulcer. Plain film x-ray performed last week was negative for osteomyelitis. Wound culture obtained last week grew strep B and OSSA; she is initiated on keflex and cefdinir today; there is erythema to the toe which could be from ace wrap compression, she has a history of wrapping too tight and has has been encouraged to maintain ace wraps that we place today. We will hold off on application of apligraf today, will apply next week after antibiotic therapy has been initiated. She admits today that she has resumed taking a shower with her foot/toe submerged in water, she has been reminded to keep foot/toe out of the bath water. She will be seen in follow up next week 06/21/18-she is seen in follow-up  evaluation for left great toe ulcer. She is tolerating antibiotic therapy with no GI disturbance. The wound is stable.  Apligraf was applied today. She has been decreasing her smoking, only had 4 cigarettes yesterday and 1 today. She continues being more compliant in diabetic diet. She will follow-up next week for evaluation of site, if stable will remove at 2 weeks. 06/28/18- She is here in follow up evalution. Apligraf was placed last week, she states the dressing fell off on Tuesday and she was dressing with hydrofera blue. She is healed and will be discharged from the clinic today. She has been instructed to continue with smoking cessation, continue monitoring glucose levels, offloading for an additional 4 weeks and continue with hydrofera blue for additional two weeks for any possible microscopic opening. Readmission: 08/07/18 on evaluation today patient presents for reevaluation concerning the ulcer of her right great toe. She was previously discharged on 06/28/18 healed. Nonetheless she states that this began to show signs of drainage she subsequently went to her primary care provider. Subsequently an x-ray was performed on 08/01/18 which was negative. The patient was also placed on antibiotics at that time. Fortunately they should have been effective for the infection. Nonetheless she's been experiencing some improvement but still has a lot of drainage coming from the wound itself. 08/14/18 on evaluation today patient's wound actually does show signs of improvement in regard to the erythema at this point. She has completed the antibiotics. With that being said we did discuss the possibility of placing her in a total contact cast as of today although I think that I may want to give this just a little bit more time to ensure nothing recurrence as far as her infection is concerned. I do not want to put in the cast and risk infection at that time if things are not completely resolved. With that being  said she is gonna require some debridement today. 08/21/18 on evaluation today patient actually appears to be doing okay in regard to her toe ulcer. She's been tolerating the dressing changes without complication. With that being said it does appear that she is ready and in fact I think it's appropriate for [place] to go ahead and initiate the total contact cast today. Nonetheless she will require some sharp debridement to prepare the wound for application. Overall I feel like things have been progressing well but we do need to do something to get this to close more readily. 08/24/18 patient seen today for reevaluation after having had the total contact cast applied on Tuesday. She seems to have done very well the wound appears to be doing great and overall I'm pleased with the progress that she's made. There were no abnormal areas of rubbing from the cast on her lower extremity. 08/30/18 on evaluation today patient actually appears to be completely healed in regard to her plantar toe ulcer. She tells me at this point she's been having a lot of issues with the cast. She almost fell a couple of times the state shall the step of her dog a couple times as well. This is been a very frustrating process for her other nonetheless she has completely healed the wound which is excellent news. Overall there does not appear to be the evidence of infection at this time which is great news. 09/11/18 evaluation today patient presents for follow-up concerning her great toe ulcer on the left which has unfortunately reopened since I last saw her which was only a couple of weeks ago. Unfortunately she was not able to get in to get the shoe and potentially the AFO that's gonna be necessary due  to her left foot drop. She continues with offloading shoe but this is not enough to prevent her from reopening it appears. When we last had her in the total contact cast she did well from a healing standpoint but unfortunately the wound  reopened as soon as she came out of the cast within just a couple of weeks. 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 This is also part of the reason why she falls on a regular basis. I just do not believe that is gonna be safe for [place] to reinitiate the total contact cast as last time we had this on she fell 3 times one week which is definitely not normal for her. 09/18/18 upon evaluation today the patient actually appears to be doing about the same in regard to her toe ulcer. She did not contact [facility] as I asked her to even though I had given her the prescription. In fact she actually states that she has no idea where the prescription is. She did apparently call [facility] and they told her that all she needed to do was bring the prescription in order to be able to be seen and work on getting the AFO for her left foot. With all that being said she still does not have an appointment and I'm not sure were things stand that regard. I will give her a new prescription today in order to contact them to get this set up. [name], [name] ([id number]) 09/25/18 on evaluation today patient actually appears to be doing about the same in regard to her toes ulcer. She does have a small areas which seems to have a lot of callous buildup around the edge of the wound which is going to need sharp debridement today. She still is waiting to be scheduled for evaluation with [facility] for possibility of an AFO. She states there supposed to call her tomorrow to get this set up. Unfortunately it does appear that her foot specifically the toe area is showing signs of erythema. There does not appear to be any systemic infection which is in these good news. 10/02/18 on evaluation today patient actually appears to be doing about the  same in regard to her toe ulcer. This really has not done too well although it's not significantly larger it's also not significantly smaller. She has been tolerating the dressing changes without complication. She actually has her appointment with [facility] and [place] tomorrow to hopefully be measured for obtaining and AFO splint. I think this would be helpful preventing this from reoccurring. We had contemplated starting the cast this week although to be honest I am reluctant to do that as she's been having nausea, vomiting, and seizure activity over the past three days. She has a history of seizures and have been told is nothing that can be done for these. With that being said I do believe that along with the seizures have the nausea vomiting which upon further questioning doesn't seem to be the normal for her and makes me concerned for the possibility of infection or something else going on. I discussed this with the patient and her mother during the office visit today. I do not feel the wound is effective but maybe something else. The responses this was "this just happens to her  at times and we don't know why". They did not seem to be interested in going to the hospital to have this checked out further. 10/09/18 on evaluation today patient presents for follow-up concerning her ongoing toe ulcer. She has been tolerating the dressing changes without complication. Fortunately there does not appear to be any evidence of infection which is great news however I do think that the patient would benefit from going ahead for with the total contact cast. She's actually in a wheelchair today she tells me that she will use her walker if we initiate the cast. I was very specific about the fact that if we were gonna do the cast I wanted to make sure that she was using the walker in order to prevent any falls. She tells me she does not have stairs that she has to traverse on a regular basis at her home. She has  not had any seizures since last week again that something that happens to her often she tells me she did talk to [employer] and they said that it may take up to three weeks to get the brace approved for her. Hopefully that will not take that long but nonetheless in the meantime I do think the cast could be of benefit. 10/12/18 on evaluation today patient appears to be doing rather well in regard to her toe ulcer. It's just been a few days and already this is significantly improved both as far as overall appearance and size. Fortunately there's no sign of infection. She is here for her first obligatory cast change. 10/19/18 Seen today for follow up and management of left great toe ulcer. Wound continues to show improvement. Noted small open area with seroussang drainage with palpation. Denies any increased pain or recent fevers during visit. She will continue calcium alginate with offloading shoe. Denies any questions or concerns during visit. 10/26/18 on evaluation today patient appears to be doing about the same as when I last saw her in regard to her wound bed. Fortunately there does not appear to be any signs of infection. Unfortunately she continues to have a breakdown in regard to the toe region any time that she is not in the cast. It takes almost no time at all for this to happen. Nonetheless she still has not heard anything from the brace being made by [employer] as to when exactly this will be available to her. Fortunately there is no signs of infection at this time. 10/30/18 on evaluation today patient presents for application of the total contact cast as we just received him this morning. Fortunately we are gonna be able to apply this to her today which is great news. She continues to have no significant pain which is good news. Overall I do feel like things have been improving while she was the cast is when she doesn't have a cast that things get worse. She still has not really heard anything from  [place] regarding her brace. 11/02/18 upon evaluation today patient's wound already appears to be doing significantly better which is good news. Fortunately there does not appear to be any signs of infection also good news. Overall I do think the total contact cast as before is helping to heal this area unfortunately it's just not gonna likely keep the area closed and healed without her getting her brace at least. Again the foot drop is a significant issue for her. 11/09/18 on evaluation today patient appears to be doing excellent in regard to her toe ulcer which  in fact is completely healed. Fortunately we finally got the situation squared away with the paperwork which was needed to proceed with getting her brace approved by Medicaid. I have filled that out unfortunately that information has been sent to the orthopedic office that I worked at 2 1/2 years ago and not tired Current wound care measures. Fortunately she seems to be doing very well at this time. 11/23/18 on evaluation today patient appears to be doing More Poorly Compared to Last Time I Saw Her. At [facility] She Had Completely Healed. Currently she is continuing to have issues with reopening. She states that she just found out that the brace was approved through Medicaid now she just has to go get measured in order to have this fitted for her and then made. Subsequently she does not have an appointment for this yet that is going to complicate things we obviously cannot put her back in the cast if we do not have everything measured because they're not gonna be able to measure her foot while she is in the cast. 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 Obviously this is unfortunate and does have me somewhat worried as well. 11/30/18 on evaluation today patient's toe ulcer actually appears to be doing fairly well. The good news is she will be  getting her brace in the shoes next week on Wednesday. Hopefully we will be able to get this to heal without having to go back in the cast however she may need the cast in order to get the wound completely heal and then go from there. Fortunately there's no signs of infection at this time. 12/07/18 on evaluation today patient fortunately did receive her brace and she states she could tell this definitely makes her walk better. With that being said she's been having issues with her toe where she noticed yesterday there was a lot of tissue that was loosing off this appears to be much larger than what it was previous. She also states that her leg has been read putting much across the top of her foot just about the ankle although this seems to be receiving somewhat. The total area is still red and appears to be someone infected as best I can tell. She is previously taken Bactrim and that may be a good option for her today as well. We are gonna see what I wound culture shows as well and I think that this is definitely appropriate. With that being said outside of the culture I still need to initiate something in the interim and that's what I'm gonna go ahead and select Bactrim is a good option for her. 12/14/18 on evaluation today patient appears to be doing better in regard to her left great toe ulcer as compared to last week's evaluation. There's still some erythema although this is significantly improved which is excellent news. Overall I do believe that she is making good progress is still gonna take some time before she is where I would like her to be from the standpoint of being able to place her back into the total contact cast. Hopefully we will be where we need to be by next week. 12/21/18 on evaluation today patient actually appears to be doing poorly in regard to her toe ulcer. She's been tolerating the dressing changes without complication. Fortunately there's no signs of systemic infection  although she does have  a lot of drainage from the toe ulcer and this does Guizar, Rosha A. ([id number]) seem to be causing some issues at this point. She does have erythema on the distal portion of her toe that appears to be likely cellulitis. 12/28/18 on evaluation today patient actually appears to be doing a little better in my pinion in regard to her toe ulcer. With that being said she still does have some evidence of infection at this time and for her culture she had both E. coli as well as enterococcus as organisms noted on evaluation. For that reason I think that though the Keflex likely has treated the E. coli rather well this has really done nothing for the enterococcus. We are going to have to initiate treatment for this specifically. 01/04/19 on evaluation today patient's toe actually appears to be doing better from the standpoint of infection. She currently would like to see about putting the cash back on I think that this is appropriate as long as she takes care of it and keeps it from getting wet. She is gonna have some drainage we can definitely pass this up with Drawtex and alginate to try to prevent as much drainage as possible from causing the problems. With that being said I do want to at least try her with the cast between now and Tuesday. If there any issues we can't continue to use it then I will discontinue the use of the cast at that point. 01/08/19 on evaluation today patient actually appears to be doing very well as far as her foot ulcer specifically the great toe on the left is concerned. She did have an area of rubbing on the medial aspect of her left ankle which again is from the cast. Fortunately there's no signs of infection at this point in this appears to be a very slight skin breakdown. The patient tells me she felt it rubbing but didn't think it was that bad. Fortunately there is no signs of active infection at this time which is good news. No fevers, chills, nausea, or  vomiting noted at this time. 01/15/19 on evaluation today patient actually appears to be doing well in regard to her toe ulcer. Again as previous she seems to do well and she has the cast on which indicates to me that during the time she doesn't have a cast on she's putting way too much pressure on this region. Obviously I think that's gonna be an issue as with the current national emergency concerning the Covid-19 Virus it has been recommended that we discontinue the use of total contact casting by the chief medical officer of our company, Dr. Simona Huh. The reasoning is that if a patient becomes sick and cannot come into have the cast removed they could not just leave this on for an additional two weeks. Obviously the hospitals also do not want to receive patient's who are sick into the emergency department to potentially contaminate the region and spread the Covid-19 Virus among other sick individuals within the hospital system. Therefore at this point we are suspending the use of total contact cast until the current emergency subsides. This was all discussed with the patient today as well. 01/22/19 on evaluation today patient's wound on her left great toe appears to be doing slightly worse than previously noted last week. She tells me that she has been on this quite a bit in fact she tells me she's been awake for 38 straight hours. This is due to the fact that she's  having to care for grandparents because nobody else will. She has been taking care of them for five the last seven days since I've seen her they both have dementia his is from a stroke and her grandmother's was progressive. Nonetheless she states even her mom who knows her condition and situation has only help two of those days to take care of them she's been taking care of the rest. Fortunately there does not appear to be any signs of active infection in regard to her toe at this point although obviously it doesn't look as good as it did  previous. I think this is directly related to her not taking off the pressure and friction by way of taking things easy. Though I completely understand what's going on. 01/29/19 on evaluation today patient's tools are actually appears to be showing some signs of improvement today compared to last week's evaluation as far as not necessarily the overall size of the wound but the fact that she has some new skin growth in between the two ends of the wound opening. Overall I feel like she has done well she states that she had a family member give her what sounds to be a CAM walker boot which has been helpful as well. 02/05/19 on evaluation today patient's wound bed actually appears to be doing significantly better in regard to her overall appearance of the size of the wound. With that being said she is still having an issue with offloading efficiently enough to get this to close. Apparently there is some signs of infection at this point as well unfortunately. Previously she's done well of Augmentin I really do not see anything that needs to be culture currently but there theme and cellulitis of the foot that I'm seeing I'm gonna go ahead and place her on an antibiotic today to try to help clear this up. 02/12/2019 on evaluation today patient actually appears to be doing poorly in regard to her overall wound status. She tells me she has been using her offloading shoe but actually comes in today wearing her tennis shoe with the AFO brace. Again as I previously discussed with her this is really not sufficient to allow the area to heal appropriately. Nonetheless she continues to be somewhat noncompliant and I do wonder based on what she has told my nurse in the past as to whether or not a good portion of this noncompliance may be recreational drug and alcohol related. She has had a history of heroin overdose and this was fairly recently in the past couple of months that have been seeing her. Nonetheless overall I  feel like her wound looks significantly worse today compared to what it was previous. She still has significant erythema despite the Augmentin I am not sure that this is an appropriate medication for her infection I am also concerned that the infection may have gone down into her bone. 02/19/19 on evaluation today patient actually appears to be doing about the same in regard to her toe ulcer. Unfortunately she continues to show signs of bone exposure and infection at this point. There does not appear to be any evidence of worsening of the infection but I'm also not really sure that it's getting significantly better. She is on the Augmentin which should be sufficient for the Staphylococcus aureus infection that she has at this point. With that being said she may need IV antibiotics to more appropriately treat this. We did have a discussion today about hyperbaric option therapy. 02/28/19 on  evaluation today patient actually appears to be doing much worse in regard to the wound on her left great toe as compared to even my previous evaluation last week. Unfortunately this seems to be training in a pretty poor direction. Her toe was actually now starting to angle laterally and I can actually see the entire joint area of the proximal portion of the digit where is the distal portion of the digit again is no longer even in contact with the joint line. Unfortunately there's a lot more necrotic tissue around the edge and the toe appears to be showing signs of becoming gangrenous in my pinion. I'm very concerned about were things stand at this point. She did see infectious disease and they are planning to send in a prescription for Sivextro for her and apparently this has been approved. With that being said I don't think she should avoid taking this but at the same time I'm not sure that it's gonna be sufficient to save her toe at this point. She tells me that she still having to care for grandparents which I think  is putting quite a bit of strain on her foot and specifically the total area and has caused this to break down even to a greater degree than would've otherwise been expected. 03/05/19 on evaluation today patient actually appears to be doing quite well in regard to her toe all things considering. She still has bone exposed but there appears to be much less your thing on overall the appearance of the wound and the toe itself is dramatically improved. She still does have some issues currently obviously with infection she did see vascular as well and there concerned that her blood flow to the toad. For that reason they are setting up for an angiogram next week. 03/14/19 on evaluation today patient appears to be doing very poor in regard to her toe and specifically in regard to the ulceration and the fact that she's starting to notice the toe was leaning even more towards the lateral aspect and the complete joint is visible on the proximal aspect of the joint. Nonetheless she's also noted a significant odor and the tip of the toe is turning more dark and necrotic appearing. Overall I think she is getting worse not better as far as this is concerned. For that reason I am recommending at this point that she likely needs to be seen for likely amputation. [name], [name] ([id number]) READMISSION 03/19/2021 This is a patient that we cared for in this clinic for a prolonged period of time in 2019 and 2020 with a left foot and left first toe wound. 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 Luana Shu. In December 2021 she had an ulcer on her right great toe as well as the fourth and fifth toes. 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 She also had an angiogram at that time and underwent angioplasty of the right anterior tibial artery. In any case she claims that  the wound on the right foot is closed I did not look at this today which was probably an oversight although I think that should be done next week. After her surgery she developed a dehiscence but I do not see any follow-up of this. According to Dr. Deborra Medina last review that she was out of the area being cared for by another  physician but recently came back to his attention. 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 A culture of this area showed E. coli apparently before she came back to see Dr. Luana Shu she was supposed to be receiving antibiotics but she did not really take them. Nor is she offloading this area at all. Finally her last hemoglobin A1c listed in epic was in March 2022 at 14.1 she says things are a lot better since then although I am not sure. 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 She had this wound at the time. During this admission she had E. coli sepsis a TEE was negative. 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 She is still a heavy cigarette smoker. She has a history of bipolar disorder seizure disorder ABI in our clinic was 1.05 6/1; left midfoot in the setting of a TMA done previously. Round circular wound with a "knuckle" of protruding tissue. The problem is that the knuckle was not attached to any of the surrounding granulation and this probed proximally widely I removed a large portion of this tissue. This wound goes with considerable undermining laterally. I do not feel any bone there was no purulence but this is a deep wound. 6/8; in spite of the debridement I did last week. She arrives with a wound looking exactly the  same. A protruding "knuckle" of tissue nonadherent to most of the surrounding tissue. There is considerable depth around this from 6-12 o'clock at 2.7 cm and undermining of 1 cm. This does not look overtly infected and the x-ray I did last week was negative for any osseous abnormalities. We have been using silver collagen 6/15; deep tissue culture I did last week showed moderate staph aureus and moderate Pseudomonas. This will definitely require prolonged antibiotic therapy. The pathology on the protuberant area was negative for malignancy fungus etc. 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 We have been using silver collagen. I am going to be prescribing Levaquin for 2 weeks. Her CT scan of the foot is down for 7/5 6/22; CT scan of the foot on 7 5. She says she has hardware in the left leg from her previous fracture. She is on the Levaquin for the deep tissue culture I did that showed methicillin sensitive staph aureus and Pseudomonas. I gave her a 2-week supply and she will have another week. She arrives in clinic today with the same protuberant tissue however this is nonadherent to the tissue surrounding it. I am really at a loss to explain this unless there is underlying deep tissue infection 6/29; patient presents for 1 week follow-up. She has been using collagen to the wound bed. She reports taking her antibiotics as prescribed.She has no complaints or issues today. She denies signs of infection. 7/6; patient presents for one week followup. She has been using collagen to the wound bed. She states she is taking Levaquin however at times she is not able to keep it down. She denies signs of infection. 7/13; patient presents for 1 week follow-up. She has been using silver alginate to the wound bed. She still has nausea when taking her antibiotics. She denies signs of infection. 7/20; patient presents  for 1 week follow-up. She has been  using silver alginate with gentamicin cream to the wound bed. She denies any issues and has no complaints today. She denies signs of infection. 7/27; patient presents for 1 week follow-up. She continues to use silver alginate with gentamicin cream to the wound bed. She reports starting her antibiotics. She has no issues or complaints. Overall she reports stability to the wound. 8/3; patient presents for 1 week follow-up. She has been using silver alginate with gentamicin cream to the wound bed. She reports completing all antibiotics. She has no issues or complaints today. She denies signs of infection. 8/17; patient presents for 2-week follow-up. He is to use silver alginate to the wound bed. She has no issues or complaints today. She denies signs of infection. She reports her pain has improved in her foot since last clinic visit 8/24; patient presents for 1 week follow-up. She continues to use silver alginate to the wound bed. She has no issues or complaints. She denies signs of infection. Pain is stable. 9/7; patient presents for follow-up. She missed her last week appointment due to feeling ill. She continues to use silver alginate. She has a new wound to the right lower extremity that is covered in eschar. She states It occurred over the past week and has no idea how it started. She currently denies signs of infection. 9/14; patient presents for follow-up. To the left foot wound she has been using gentamicin cream and silver alginate. To the right lower extremity wound she has been keeping this covered and has not obtain Santyl. 9/21; patient presents for follow-up. She reports using gentamicin cream and silver alginate to the left foot and Santyl to the right lower extremity wound. She has no issues or complaints today. She denies signs of infection. 9/28; patient presents for follow-up. She reports a new wound to her right heel. She states this occurred a few days ago and is  progressively gotten worse. She has been trying to clean the area with a Q-tip and Santyl. She reports stability in the other 2 wounds. She has been using gentamicin cream and silver alginate to the left foot and Santyl to the right lower extremity wound. 10/12; patient presents for follow-up. She reports improvement to the wound beds. She is seeing vein and vascular to discuss the potential of a left BKA. She states they are going to do an arteriogram. She continues to use silver alginate with dressing changes to her wounds. 11/2; patient presents for follow-up. She states she has not been doing dressing changes to the wound beds. She states she is not able to offload the areas. She reports chronic pain to her left foot wound. 11/9; patient presents for follow-up. She came in with only socks on. She states she forgot to put on shoes. It is unclear if she is doing any dressing changes. She currently denies systemic signs of infection. 11/16; patient presents for follow-up. She came again only with socks on. She states she does not wear shoes ever. It is unclear if she does dressing changes. She currently denies systemic signs of infection. 11/23; patient presents for follow-up. She wore her shoes today. It still unclear exactly what dressing she is using for each wound but she did states she obtained Dakin's solution and has been using this to the left foot wound. She currently denies signs of infection. ADISA, VIGEANT ([id number]) 11/30; patient presents for follow-up. She has no issues or complaints today. She currently denies  signs of infection. 12/7; patient presents for follow-up. She has no issues or complaints today. She has been using Hydrofera Blue to the right heel wound and Dakin solution to the left foot wound. Her right anterior leg wound is healed. She currently denies signs of infection. 12/14; patient presents for follow-up. She has been using Hydrofera Blue to the right heel and  Dakin's to the left foot wounds. She has no issues or complaints today. She denies signs of infection. 12/21; patient presents for follow-up. She reports using Hydrofera Blue to the right heel and Dakin's to the left foot wound. She denies signs of infection. 12/28; patient presents for follow-up. She continues to use Dakin's to the left foot wound and Hydrofera Blue to the right heel wound. She denies signs of infection. 1/4; patient presents for follow-up. She has no issues or complaints today. She denies signs of infection. 1/11; patient presents for follow-up. It is unclear if she has been dressing these wounds over the past week. She currently denies signs of infection. 1/18; patient presents for follow-up. She states she has been using Dakin's wet-to-dry dressings to the left foot. She has been using Hydrofera Blue to the right foot foot wound. She states that the anterior right leg wound has reopened and draining serous fluid. She denies signs of infection. 1/25; patient presents for follow-up. She has no issues or complaints today. 2/1; patient presents for follow-up. She has no issues or complaints today. She denies signs of infection. 2/8; patient presents for follow-up. She has lost her surgical shoes. She did not have a dressing to the right heel wound. She currently denies signs of infection. 2/15; patient presents for follow-up. She reports more pain to the right heel today. She denies purulent drainage Or fever/chills 2/22; patient presents for follow-up. She reports taking clindamycin over the past week. She states that she continues to have pain to her right heel. She reports purulent drainage. Readmission 03/16/2022 Ms. Kerri-Anne Haeberle is a 46 year old female with a past medical history of type 2 diabetes, osteomyelitis to her feet, chronic systolic heart failure and bipolar disorder that presents to the clinic for bilateral feet wounds and right lower extremity wound. She was  last seen in our clinic on 12/15/2021. At that time she had purulent drainage coming out of her right plantar foot and I recommended she go to the ED. She states she went to [facility] and has been there for the past 3 months. I cannot see the records. She states she had OR debridement and was on several weeks of IV antibiotics while inpatient. Since discharge she has not been taking care of the wound beds. She had nothing on her feet other than socks today. She currently denies signs of infection. 5/31; patient presents for follow-up. She has been using Dakin's wet-to-dry dressings to the wound beds on her feet bilaterally and antibiotic ointment to the right anterior leg wound. She had a wound culture done at last clinic visit that showed moderate Pseudomonas aeruginosa sensitive to ciprofloxacin. She currently denies systemic signs of infection. 6/14; patient presents for follow-up. She received Keystone 5 days ago and has been using this on the wound beds. She states that last week she had to go to the hospital because she had increased warmth and erythema to the right foot. She was started on 2 oral antibiotics. She states she has been taking these. She currently denies systemic signs of infection. She has no issues or complaints today. 6/21;  patient presents for follow-up. She states she has been using Keystone antibiotics to the wound beds. She has no issues or complaints today. She denies signs of infection. 6/28; patient presents for follow-up. She has been using Keystone antibiotics to the wound beds. She has no issues or complaints today. 7/12; patient presents for follow-up. Has been using Keystone antibiotics to the wound beds with calcium alginate. She has no issues or complaints today. She never followed up with her orthopedic surgeon who did the OR debridement to the right foot. We discussed the total contact cast for the left foot and patient would like to do this next  week. 7/19; patient presents for follow-up. She has been using Keystone antibiotics with calcium alginate to the wound beds. She has no issues or complaints today. Patient is in agreement to do the total contact cast of the left foot today. She knows to return later this week for the obligatory cast change. 05-13-2022 upon evaluation today patient's wound which she has the cast of the left leg actually appears to be doing significantly better. Fortunately I do not see any signs of active infection locally or systemically which is great news and overall I am extremely pleased with where we stand currently. 7/26; patient presents for follow-up. She has a cast in place for the past week. She states it irritated her shin. Other than that she tolerated the cast well. She states she would like a break for 1 week from the cast. We have been using Keystone antibiotic and Aquacel to both wound beds. She denies signs of infection. 8/2; patient presents for follow-up. She has been using Keystone and Aquacel to the wound beds. She denies any issues and has no complaints. She is agreeable to have the cast placed today for the left leg. 06-03-2022 upon evaluation today patient appears to be doing well with regard to her wound she saw some signs of improvement which is great news. Fortunately I do not see any evidence of active infection locally or systemically at this time which is great news. No fevers, chills, nausea, vomiting, or diarrhea. STARLETTE, THUROW A. ([id number]) 8/16; patient presents for follow-up. She has no issues or complaints today. We have been using Keystone and Aquacel to the wound beds. The left lower extremity is in a total contact cast. She is tolerated this well. 8/23; patient presents for follow-up. She has had the total contact cast on the left leg for the past week. Unfortunately this has rubbed and broken down the skin to the medial foot. She currently denies signs of infection. She has  been using Keystone antibiotic to the right plantar foot wound. 8/30; patient presents for follow-up. We have held off on the total contact cast for the left leg for the past week. Her wound on the left foot has improved and the previous surrounding breakdown of skin has epithelialized. She has been using Keystone antibiotic to both wound beds. She has no issues or complaints today. She denies signs of infection. 9/6; patient presents for follow-up. She has ordered her's Keystone antibiotic and this is arriving this week. She has been doing Dakin's wet-to- dry dressings to the wound beds. She denies signs of infection. She is agreeable to the total contact cast today. 9/13; patient presents for follow-up. She states that the cast caused her left leg shin to be sore. She would like to take a break from the cast this week. She has been using Keystone antibiotic to the right plantar foot  wound. She denies signs of infection. 9/20; patient presents for follow-up. She has been using Keystone antibiotics to the wound beds with calcium alginate to the right foot wound and Hydrofera Blue to the left foot wound. She is agreeable to having the cast placed today. She has been approved for Apligraf and we will order this for next clinic visit. 9/27; patient presents for follow-up. We have been using Keystone antibiotic with Hydrofera Blue to the left foot wound under a total contact cast. To the right foot wound she has been using Keystone antibiotic and calcium alginate. She declines a total contact cast today. Apligraf is available for placement and she would like to proceed with this. 07-28-2022 upon evaluation today patient appears to be doing well currently in regard to her wound. She is actually showing signs of significant improvement which is great news. Fortunately I do not see any evidence of active infection locally nor systemically at this time. She has been seeing Dr. Heber [place] and to be honest has  been doing very well with the cast. Subsequently she comes in today with a cast on and we did reapply that today as well. She did not really want to she try to talk me out of that but I explained that if she wanted to heal this is really the right way to go. Patient voiced understanding. In regard to her right foot this is actually a lot better compared to the last time I saw her which is also great news. Objective Constitutional Well-nourished and well-hydrated in no acute distress. Vitals Time Taken: 12:39 PM, Height: 69 in, Weight: 178 lbs, BMI: 26.3, Temperature: 9+8.2 F, Pulse: 62 bpm, Respiratory Rate: 18 breaths/min, Blood Pressure: 115/58 mmHg. Respiratory normal breathing without difficulty. Psychiatric this patient is able to make decisions and demonstrates good insight into disease process. Alert and Oriented x 3. pleasant and cooperative. General Notes: Upon inspection patient's wound bed actually showed signs of good granulation and epithelization at this point. Fortunately I do not see any evidence of active infection locally or systemically which is great news. I therefore did go ahead and reapply the Apligraf today which I think will also benefit her. Overall I think that she is moving in the right direction. Integumentary (Hair, Skin) Wound #11 status is Open. Original cause of wound was Surgical Injury. The date acquired was: 12/01/2021. The wound has been in treatment 19 weeks. The wound is located on the Venus. The wound measures 0.5cm length x 0.3cm width x 0.6cm depth; 0.118cm^2 area and 0.071cm^3 volume. There is Fat Layer (Subcutaneous Tissue) exposed. There is no tunneling or undermining noted. There is a medium amount of serosanguineous drainage noted. The wound margin is distinct with the outline attached to the wound base. There is medium (34-66%) pink, pale granulation within the wound bed. There is a medium (34-66%) amount of necrotic tissue within the  wound bed including Adherent Slough. Wound #12 status is Open. Original cause of wound was Pressure Injury. The date acquired was: 03/16/2020. The wound has been in treatment 19 weeks. The wound is located on the Ray. The wound measures 1.5cm length x 0.5cm width x 0.1cm depth; 0.589cm^2 area and 0.059cm^3 volume. There is Fat Layer (Subcutaneous Tissue) exposed. There is no tunneling or undermining noted. There is a medium amount of serous drainage noted. The wound margin is flat and intact. There is large (67-100%) red, pink granulation within the wound bed. There is no necrotic tissue within the wound bed.  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 A skin graft procedure using a bioengineered skin substitute/cellular or tissue based product was performed by Tommie Sams., PA-C with the following instrument(s): Blade, Forceps, and Scissors. Apligraf was applied and secured with Steri-Strips. 20 sq cm of product was utilized and 24 sq cm was wasted due to size of wound. Post Application, adaptic was applied. A Time Out was conducted at 13:10, prior to the start of the procedure. The procedure was tolerated well with a pain level of 0 throughout and a pain level of 0 following the procedure. Post procedure Diagnosis Wound #12: Same as Pre-Procedure . Plan Follow-up Appointments: Return Appointment in 1 week. Nurse Visit as needed Bathing/ Shower/ Hygiene: May  shower with wound dressing protected with water repellent cover or cast protector. No tub bath. Anesthetic (Use 'Patient Medications' Section for Anesthetic Order Entry): Lidocaine applied to wound bed Cellular or Tissue Based Products: Cellular or Tissue Based Product Type: - apligraf #2 applied to left foot Edema Control - Lymphedema / Segmental Compressive Device / Other: Elevate, Exercise Daily and Avoid Standing for Long Periods of Time. Elevate legs to the level of the heart and pump ankles as often as possible Elevate leg(s) parallel to the floor when sitting. Off-Loading: Total Contact Cast to Left Lower Extremity - contact cast #8 applied Don't get cast wet Other: - keep pressure off of feet. Medications-Please add to medication list.: [employer] - not available to apply this visit 07/28/22 Keystone gel right foot WOUND #11: - Foot Wound Laterality: Plantar, Right Cleanser: Soap and Water Every Other Day/30 Days Discharge Instructions: Gently cleanse wound with antibacterial soap, rinse and pat dry prior to dressing wounds Topical: keystone gel Every Other Day/30 Days Primary Dressing: Aquacel Extra Hydrofiber Dressing, 2x2 (in/in) Every Other Day/30 Days Secondary Dressing: ABD Pad 5x9 (in/in) (Generic) Every Other Day/30 Days Discharge Instructions: Cover with ABD pad Secured With: Medipore Tape - 34M Medipore H Soft Cloth Surgical Tape, 2x2 (in/yd) (Generic) Every Other Day/30 Days Secured With: Kerlix Roll Sterile or Non-Sterile 6-ply 4.5x4 (yd/yd) (Generic) Every Other Day/30 Days Discharge Instructions: Apply Kerlix as directed Add-Ons: total contact cast Every Other Day/30 Days WOUND #12: - Foot Wound Laterality: Plantar, Left, Medial Cleanser: Soap and Water 1 x Per Week/30 Days Discharge Instructions: Gently cleanse wound with antibacterial soap, rinse and pat dry prior to dressing wounds Topical: Vitamin AandD Ointment, 4(oz) tube 1 x Per Week/30 Days Discharge  Instructions: apply to red, broken down area Primary Dressing: Apligraf 1 x Per Week/30 Days Discharge Instructions: #2 applied today Secondary Dressing: ABD Pad 5x9 (in/in) (Generic) 1 x Per Week/30 Days Discharge Instructions: Cover with ABD pad Secondary Dressing: Gauze 1 x Per Week/30 Days Discharge Instructions: As directed: dry, moistened with saline or moistened with Dakins Solution Secured With: Medipore Tape - 34M Medipore H Soft Cloth Surgical Tape, 2x2 (in/yd) (Generic) 1 x Per Week/30 Days Coufal, Marsia A. ([id number]) Secured With: Kerlix Roll Sterile or Non-Sterile 6-ply 4.5x4 (yd/yd) (Generic) 1 x Per Week/30 Days Discharge  Instructions: Apply Kerlix as directed Add-Ons: total contact cast 1 x Per Week/30 Days 1. I am going to suggest that we have the patient go ahead and continue to monitor for any signs of worsening or infection. Obviously right now based on what I see I do believe that the patient is doing well with the Apligraf as well as a total contact cast on the left foot. This is getting very close to complete resolution and looks significantly better even compared to last week. 2. I am also can recommend that we should have the patient continue with the Apligraf on the right foot which I did put on and then subsequently I left it uncovered as far as any Adaptic is concerned and she can apply the topical antibiotics in the form of the [facility] that she has currently. She is in agreement with that plan. We will see patient back for reevaluation in 1 week here in the clinic. If anything worsens or changes patient will contact our office for additional recommendations. Electronic Signature(s) Signed: 07/28/2022 2:04:52 PM By: Worthy Keeler PA-C Entered By: Worthy Keeler on 07/28/2022 14:04:52 Thornell, Sydnee AMarland Kitchen ([id number]) -------------------------------------------------------------------------------- Total Contact Cast Details Patient Name: Heather Dear A. Date of  Service: 07/28/2022 12:30 PM Medical Record Number: [id number] Patient Account Number: [id number] Date of Birth/Sex: 07-15-1976 (46 y.o. F) Treating RN: Carlene Coria Primary Care Provider: Raelene Bott Other Clinician: Referring Provider: Raelene Bott Treating Provider/Extender: Skipper Cliche in Treatment: 19 Total Contact Cast Applied for Wound Assessment: Wound #12 Left,Medial,Plantar Foot Performed By: Physician Tommie Sams., PA-C Post Procedure Diagnosis Same as Pre-procedure Electronic Signature(s) Signed: 07/28/2022 2:25:36 PM By: Carlene Coria RN Signed: 07/28/2022 2:50:44 PM By: Worthy Keeler PA-C Entered By: Carlene Coria on 07/28/2022 14:25:35 Doi, Jaycey AMarland Kitchen ([id number]) -------------------------------------------------------------------------------- SuperBill Details Patient Name: Heather Dear A. Date of Service: 07/28/2022 Medical Record Number: [id number] Patient Account Number: [id number] Date of Birth/Sex: August 16, 1976 (46 y.o. F) Treating RN: Carlene Coria Primary Care Provider: Raelene Bott Other Clinician: Referring Provider: Raelene Bott Treating Provider/Extender: Skipper Cliche in Treatment: 19 Diagnosis Coding ICD-10 Codes Code Description [phone] 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ity 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.621 Type 2 diabetes mellitus with foot ulcer E11.42 Type 2 diabetes mellitus with diabetic polyneuropathy L97.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 of skin Facility Procedures CPT4 Code: [id number] Description: [phone] (Facility Use Only) Apligraf 44 SQ CM Modifier: Quantity: Leach Code: [id number] Description: 51025 - SKIN SUB GRAFT FACE/NK/HF/G Modifier: Quantity: 1 CPT4 Code: Description: ICD-10 Diagnosis Description L97.528 Non-pressure chronic ulcer of other part of left foot  with other specified Modifier: severity Quantity: Physician Procedures CPT4 Code: 8527782 Description: 42353 - WC PHYS SKIN SUB GRAFT FACE/NK/HF/G Modifier: Quantity: 1 CPT4 Code: Description: ICD-10 Diagnosis Description 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

## 2022-08-03 ENCOUNTER — Encounter (HOSPITAL_BASED_OUTPATIENT_CLINIC_OR_DEPARTMENT_OTHER): Payer: Medicaid Other | Admitting: Internal Medicine

## 2022-08-03 DIAGNOSIS — L97528 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ity: Secondary | ICD-10-CM

## 2022-08-03 DIAGNOSIS — E11621 Type 2 diabetes mellitus with foot ulcer: Secondary | ICD-10-CM | POA: Diagnosis not present

## 2022-08-10 ENCOUNTER — Encounter: Payer: Medicaid Other | Admitting: Internal Medicine

## 2022-08-11 NOTE — Progress Notes (Signed)
[name] ([id number]) [id number] Page 1 of 10 Visit Report for 08/03/2022 Arrival Information Details Patient Name: Date of Service: [name], [name] 08/03/2022 9:45 [name]: [id number] Patient Account Number: [id number] Date of Birth/Sex: Treating RN: 1976/08/28 (46 y.o. Orvan Falconer Primary Care Haskel Dewalt: Raelene Bott Other Clinician: Massie Kluver Referring Kenzly Rogoff: Treating Josaiah Muhammed/Extender: Eddie North in Treatment: 87 Visit Information History Since Last Visit All ordered tests and consults were completed: No Patient Arrived: Wheel Chair Added or deleted any medications: No Arrival Time: 09:53 Any new allergies or adverse reactions: No Accompanied By: mother Had [name] fall or experienced change in No Transfer Assistance: None activities of daily living that may affect Patient Identification Verified: Yes risk of falls: Secondary Verification Process Completed: Yes Signs or symptoms of abuse/neglect since last visito No Patient Requires Transmission-Based Precautions: No Hospitalized since last visit: No Patient Has Alerts: No Implantable device outside of the clinic excluding No cellular tissue based products placed in the center since last visit: Has Dressing in Place as Prescribed: Yes Has Footwear/Offloading in Place as Prescribed: Yes Left: T Contact Cast otal Pain Present Now: No Electronic Signature(s) Signed: 08/05/2022 12:25:01 PM By: Carlene Coria RN Entered By: Carlene Coria on 08/03/2022 09:58:24 -------------------------------------------------------------------------------- Clinic Level of Care Assessment Details Patient Name: Date of Service: [name] 08/03/2022 9:45 [name]: [id number] Patient Account Number: [id number] Date of Birth/Sex: Treating RN: [birth year] (46 y.o. Orvan Falconer Primary Care Sacha Topor: Raelene Bott Other Clinician:  Massie Kluver Referring Chastidy Ranker: Treating [name]/Extender: Eddie North in Treatment: 20 Clinic Level of Care Assessment Items TOOL 1 Quantity Score []  - 0 Use when EandM and Procedure is performed on INITIAL visit ASSESSMENTS - Nursing Assessment / Reassessment []  - 0 General Physical Exam (combine w/ comprehensive assessment (listed just below) when performed on new pt. evalsBRYSTOL, WASILEWSKI [name] ([id number]) [id number] Page 2 of 10 []  - 0 Comprehensive Assessment (HX, ROS, Risk Assessments, Wounds Hx, etc.) ASSESSMENTS - Wound and Skin Assessment / Reassessment []  - 0 Dermatologic / Skin Assessment (not related to wound area) ASSESSMENTS - Ostomy and/or Continence Assessment and Care []  - 0 Incontinence Assessment and Management []  - 0 Ostomy Care Assessment and Management (repouching, etc.) PROCESS - Coordination of Care []  - 0 Simple Patient / Family Education for ongoing care []  - 0 Complex (extensive) Patient / Family Education for ongoing care []  - 0 Staff obtains Programmer, systems, Records, T Results / Process Orders est []  - 0 Staff telephones HHA, Nursing Homes / Clarify orders / etc []  - 0 Routine Transfer to another Facility (non-emergent condition) []  - 0 Routine Hospital Admission (non-emergent condition) []  - 0 New Admissions / Biomedical engineer / Ordering NPWT Apligraf, etc. , []  - 0 Emergency Hospital Admission (emergent condition) PROCESS - Special Needs []  - 0 Pediatric / Minor Patient Management []  - 0 Isolation Patient Management []  - 0 Hearing / Language / Visual special needs []  - 0 Assessment of Community assistance (transportation, D/C planning, etc.) []  - 0 Additional assistance / Altered mentation []  - 0 Support Surface(s) Assessment (bed, cushion, seat, etc.) INTERVENTIONS - Miscellaneous []  - 0 External ear exam []  - 0 Patient Transfer (multiple staff / Civil Service fast streamer / Similar  devices) []  - 0 Simple Staple / Suture removal (25 or less) []  - 0 Complex Staple / Suture removal (26 or more) []  - 0 Hypo/Hyperglycemic Management (do not check if billed separately) []  -  0 Ankle / Brachial Index (ABI) - do not check if billed separately Has the patient been seen at the hospital within the last three years: Yes Total Score: 0 Level Of Care: ____ Electronic Signature(s) Signed: 08/11/2022 11:24:03 AM By: Massie Kluver Entered By: Massie Kluver on 08/03/2022 10:32:40 -------------------------------------------------------------------------------- Encounter Discharge Information Details Patient Name: Date of Service: Heather Hitch [name]. 08/03/2022 9:45 [name]: [id number] Patient Account Number: [id number] Date of Birth/Sex: Treating RN: 07-12-1976 (46 y.o. Chaniqua, Brisby, Angell [name] ([id number]) [id number] Page 3 of 10 Primary Care Sujay Grundman: Raelene Bott Other Clinician: Massie Kluver Referring Aerianna Losey: Treating Esmeralda Blanford/Extender: Eddie North in Treatment: 20 Encounter Discharge Information Items Post Procedure Vitals Discharge Condition: Stable Temperature (F): 98.4 Ambulatory Status: Wheelchair Pulse (bpm): 51 Discharge Destination: Home Respiratory Rate (breaths/min): 18 Transportation: Private Auto Blood Pressure (mmHg): 94/55 Accompanied By: mother Schedule Follow-up Appointment: Yes Clinical Summary of Care: Electronic Signature(s) Signed: 08/11/2022 11:24:03 AM By: Massie Kluver Entered By: Massie Kluver on 08/03/2022 10:35:21 -------------------------------------------------------------------------------- Multi Wound Chart Details Patient Name: Date of Service: Heather Hitch [name]. 08/03/2022 9:45 [name]: [id number] Patient Account Number: [id number] Date of Birth/Sex: Treating RN: 11-Jan-1976 (46 y.o. Orvan Falconer Primary Care Cailie Bosshart:  Raelene Bott Other Clinician: Massie Kluver Referring Shamar Engelmann: Treating Acelynn Dejonge/Extender: Eddie North in Treatment: 20 Vital Signs Height(in): 109 Pulse(bpm): 56 Weight(lbs): 178 Blood Pressure(mmHg): 87/55 Body Mass Index(BMI): 26.3 Temperature(F): 98.4 Respiratory Rate(breaths/min): 18 [11:Photos: No Photos Right, Plantar Foot Wound Location: Surgical Injury Wounding Event: Open Surgical Wound Primary Etiology: Chronic sinus problems/congestion, Chronic sinus problems/congestion, Comorbid History: Middle ear problems, Anemia, Chronic Middle ear  problems, Anemia, Chronic Obstructive Pulmonary Disease (COPD), Congestive Heart Failure, Type II Diabetes, End Stage Renal Disease, History of pressure wounds, Disease, History of pressure wounds, Neuropathy 12/01/2021 Date Acquired: 20 Weeks of  Treatment: Open Wound Status: No Wound Recurrence: 0.5x0.3x0.7 Measurements L x W x D (cm) 0.118 [name] (cm) : rea 0.082 Volume (cm) : 93.60% % Reduction in Area: 95.10% % Reduction in Volume: Full Thickness Without Exposed Classification: Support  Structures Medium Exudate Amount: Serosanguineous Exudate Type: red, brown Exudate Color: Distinct, outline attached Wound Margin: Medium (34-66%) Granulation Amount:] [12:No Photos Left, Medial, Plantar Foot Pressure Injury Diabetic Wound/Ulcer of the  Lower Extremity Obstructive Pulmonary Disease (COPD), Congestive Heart Failure, Type II Diabetes, End Stage Renal Neuropathy 03/16/2020 20 Open No 1.7x0.5x0.1 0.668 0.067 85.80% 92.90% Grade 3 Medium Serous amber Flat and Intact Large (67-100%)] [N/[name]:N/[name]  N/[name] N/[name] N/[name] N/[name] N/[name] N/[name] N/[name] N/[name] N/[name] N/[name] N/[name] N/[name] N/[name] N/[name] N/[name] N/[name] N/[name] N/[name] N/[name]] Hardman, Mattilynn [name] ([id number]) [11:Pink, Pale Granulation Quality: Medium (34-66%) Necrotic Amount: Fat Layer (Subcutaneous Tissue): Yes Fat Layer (Subcutaneous Tissue): Yes N/[name] Exposed Structures: Fascia: No Tendon: No Muscle: No Joint: No Bone: No Medium (34-66%)  Epithelialization:]  [12:Red, Pink None Present (0%) Fascia: No Tendon: No Muscle: No Joint: No Bone: No Small (1-33%)] [N/[name]:[id number] Page 4 of 10 N/[name] N/[name] N/[name]] Treatment Notes Electronic Signature(s) Signed: 08/11/2022 11:24:03 AM By: Massie Kluver Entered By: Massie Kluver on 08/03/2022 10:15:20 -------------------------------------------------------------------------------- Multi-Disciplinary Care Plan Details Patient Name: Date of Service: Heather Hitch [name]. 08/03/2022 9:45 [name]: [id number] Patient Account Number: [id number] Date of Birth/Sex: Treating RN: 1976-10-18 (46 y.o. Orvan Falconer Primary Care Marijose Curington: Raelene Bott Other Clinician: Massie Kluver Referring Melinda Gwinner: Treating Zeena Starkel/Extender: Eddie North in Treatment: 20 Active Inactive Abuse / Safety /  Falls / Self Care Management Nursing Diagnoses: History of Falls Potential for falls Potential for injury related to falls Goals: Patient will not develop complications from immobility Date Initiated: 03/17/2022 Date Inactivated: 04/20/2022 Target Resolution Date: 03/16/2022 Goal Status: Met Patient/caregiver will verbalize understanding of skin care regimen Date Initiated: 03/17/2022 Target Resolution Date: 03/16/2022 Goal Status: Active Patient/caregiver will verbalize/demonstrate measure taken to improve self care Date Initiated: 03/17/2022 Target Resolution Date: 03/16/2022 Goal Status: Active Interventions: Assess fall risk on admission and as needed Provide education on basic hygiene Provide education on personal and home safety Notes: Necrotic Tissue Nursing Diagnoses: Impaired tissue integrity related to necrotic/devitalized tissue Knowledge deficit related to management of necrotic/devitalized tissue Goals: Necrotic/devitalized tissue will be minimized in the wound bed Bunyan, Karle [name] ([id number])  [id number] Page 5 of 10 Date Initiated: 03/17/2022 Date Inactivated: 05/04/2022 Target Resolution Date: 03/16/2022 Goal Status: Met Patient/caregiver will verbalize understanding of reason and process for debridement of necrotic tissue Date Initiated: 03/17/2022 Target Resolution Date: 03/16/2022 Goal Status: Active Interventions: Provide education on necrotic tissue and debridement process Treatment Activities: Apply topical anesthetic as ordered : 03/16/2022 Excisional debridement : 03/16/2022 Notes: Osteomyelitis Nursing Diagnoses: Infection: osteomyelitis Knowledge deficit related to disease process and management Goals: Patient/caregiver will verbalize understanding of disease process and disease management Date Initiated: 03/17/2022 Date Inactivated: 04/20/2022 Target Resolution Date: 03/16/2022 Goal Status: Met Patient's osteomyelitis will resolve Date Initiated: 03/17/2022 Target Resolution Date: 03/16/2022 Goal Status: Active Signs and symptoms for osteomyelitis will be recognized and promptly addressed Date Initiated: 03/17/2022 Target Resolution Date: 03/16/2022 Goal Status: Active Interventions: Assess for signs and symptoms of osteomyelitis resolution every visit Provide education on osteomyelitis Treatment Activities: Systemic antibiotics : 03/16/2022 Notes: Wound/Skin Impairment Nursing Diagnoses: Impaired tissue integrity Knowledge deficit related to smoking impact on wound healing Knowledge deficit related to ulceration/compromised skin integrity Goals: Patient/caregiver will verbalize understanding of skin care regimen Date Initiated: 03/17/2022 Date Inactivated: 04/20/2022 Target Resolution Date: 03/16/2022 Goal Status: Met Ulcer/skin breakdown will have [name] volume reduction of 30% by week 4 Date Initiated: 03/17/2022 Target Resolution Date: 04/13/2022 Goal Status: Active Ulcer/skin breakdown will have [name] volume reduction of 50% by week  8 Date Initiated: 03/17/2022 Target Resolution Date: 05/04/2022 Goal Status: Active Ulcer/skin breakdown will have [name] volume reduction of 80% by week 12 Date Initiated: 03/17/2022 Target Resolution Date: 06/01/2022 Goal Status: Active Ulcer/skin breakdown will heal within 14 weeks Date Initiated: 03/17/2022 Target Resolution Date: 06/15/2022 Goal Status: Active Interventions: Assess ulceration(s) every visit Provide education on ulcer and skin care Treatment Activities: Skin care regimen initiated : 03/16/2022 Notes: Electronic Signature(s) Signed: 08/05/2022 12:25:01 PM By: Carlene Coria RN Signed: 08/11/2022 11:24:03 AM By: Yvonna Alanis, Shine [name] ([id number]) [id number] Page 6 of 10 Signed: 08/11/2022 11:24:03 AM By: Massie Kluver Entered By: Massie Kluver on 08/03/2022 10:15:11 -------------------------------------------------------------------------------- Pain Assessment Details Patient Name: Date of Service: [name], [name] 08/03/2022 9:45 [name]: [id number] Patient Account Number: [id number] Date of Birth/Sex: Treating RN: [birth year] (46 y.o. Orvan Falconer Primary Care Sephora Boyar: Raelene Bott Other Clinician: Massie Kluver Referring Junell Cullifer: Treating Philippa Vessey/Extender: Eddie North in Treatment: 20 Active Problems Location of Pain Severity and Description of Pain Patient Has Paino No Site Locations Pain Management and Medication Current Pain Management: Electronic Signature(s) Signed: 08/05/2022 12:25:01 PM By: Carlene Coria RN Entered By: Carlene Coria on 08/03/2022 09:58:48 -------------------------------------------------------------------------------- Patient/Caregiver Education Details Patient Name: Date of Service: [name] 10/11/2023andnbsp9:45 [place] Record Number: [id number] Patient Account  Number: [id number] Date of Birth/Gender: Treating RN: 1976-06-05 (46 y.o.  [name], Depaoli, Jazzmine [name] ([id number]) [id number] Page 7 of 10 Primary Care Physician: Raelene Bott Other Clinician: Massie Kluver Referring Physician: Treating Physician/Extender: Eddie North in Treatment: 20 Education Assessment Education Provided To: Patient Education Topics Provided Wound/Skin Impairment: Handouts: Other: continue wound care as directed Methods: Explain/Verbal Responses: State content correctly Electronic Signature(s) Signed: 08/11/2022 11:24:03 AM By: Massie Kluver Entered By: Massie Kluver on 08/03/2022 10:33:11 -------------------------------------------------------------------------------- Wound Assessment Details Patient Name: Date of Service: Heather Hitch [name]. 08/03/2022 9:45 [name]: [id number] Patient Account Number: [id number] Date of Birth/Sex: Treating RN: Jul 11, 1976 (46 y.o. Orvan Falconer Primary Care Lorynn Moeser: Raelene Bott Other Clinician: Massie Kluver Referring Cashlynn Yearwood: Treating Fara Worthy/Extender: Linna Hoff Weeks in Treatment: 20 Wound Status Wound Number: 11 Primary Open Surgical Wound Etiology: Wound Location: Right, Plantar Foot Wound Open Wounding Event: Surgical Injury Status: Date Acquired: 12/01/2021 Comorbid Chronic sinus problems/congestion, Middle ear problems, Anemia, Weeks Of Treatment: 20 History: Chronic Obstructive Pulmonary Disease (COPD), Congestive Heart Clustered Wound: No Failure, Type II Diabetes, End Stage Renal Disease, History of pressure wounds, Neuropathy Wound Measurements Length: (cm) 0.5 Width: (cm) 0.3 Depth: (cm) 1 Area: (cm) 0.118 Volume: (cm) 0.118 % Reduction in Area: 93.6% % Reduction in Volume: 92.9% Epithelialization: Medium (34-66%) Tunneling: No Undermining: No Wound Description Classification: Full Thickness Without Exposed Suppor Wound Margin: Distinct, outline  attached Exudate Amount: Medium Exudate Type: Serosanguineous Exudate Color: red, brown t Structures Foul Odor After Cleansing: No Slough/Fibrino Yes Wound Bed Granulation Amount: Medium (34-66%) Exposed Structure Granulation Quality: Pink, Pale Fascia Exposed: No Necrotic Amount: Medium (34-66%) Fat Layer (Subcutaneous Tissue) Exposed: Yes Tendon Exposed: No Muscle Exposed: No Routh, Rissie [name] ([id number]) [id number] Page 8 of 10 Joint Exposed: No Bone Exposed: No Treatment Notes Wound #11 (Foot) Wound Laterality: Plantar, Right Cleanser Soap and Water Discharge Instruction: Gently cleanse wound with antibacterial soap, rinse and pat dry prior to dressing wounds Peri-Wound Care Topical keystone gel Primary Dressing Aquacel Extra Hydrofiber Dressing, 2x2 (in/in) Secondary Dressing ABD Pad 5x9 (in/in) Discharge Instruction: Cover with ABD pad Secured With Medipore T - 44M Medipore H Soft Cloth Surgical T ape ape, 2x2 (in/yd) Kerlix Roll Sterile or Non-Sterile 6-ply 4.5x4 (yd/yd) Discharge Instruction: Apply Kerlix as directed Compression Wrap Compression Stockings Add-Ons total contact cast Electronic Signature(s) Signed: 08/05/2022 12:25:01 PM By: Carlene Coria RN Signed: 08/11/2022 11:24:03 AM By: Massie Kluver Entered By: Massie Kluver on 08/03/2022 10:25:24 -------------------------------------------------------------------------------- Wound Assessment Details Patient Name: Date of Service: Heather Hitch [name]. 08/03/2022 9:45 [name]: [id number] Patient Account Number: [id number] Date of Birth/Sex: Treating RN: Jun 19, 1976 (46 y.o. Orvan Falconer Primary Care Lennie Vasco: Raelene Bott Other Clinician: Massie Kluver Referring Chelesa Weingartner: Treating Karianna Gusman/Extender: Linna Hoff Weeks in Treatment: 20 Wound Status Wound Number: 12 Primary Diabetic Wound/Ulcer of the Lower Extremity Etiology: Wound  Location: Left, Medial, Plantar Foot Wound Open Wounding Event: Pressure Injury Status: Date Acquired: 03/16/2020 Comorbid Chronic sinus problems/congestion, Middle ear problems, Anemia, Weeks Of Treatment: 20 History: Chronic Obstructive Pulmonary Disease (COPD), Congestive Heart Clustered Wound: No Failure, Type II Diabetes, End Stage Renal Disease, History of pressure wounds, Neuropathy Wound Measurements Length: (cm) 1.7 Width: (cm) 0.5 Depth: (cm) 0.1 Area: (cm) 0.668 Chanthavong, Ceceilia [name] ([id number]) Volume: (cm) 0.067 % Reduction in Area: 85.8% % Reduction in Volume: 92.9% Epithelialization: Small (1-33%) Tunneling: No [id number] Page 9 of 10 Undermining: No Wound Description Classification:  Grade 3 Wound Margin: Flat and Intact Exudate Amount: Medium Exudate Type: Serous Exudate Color: amber Foul Odor After Cleansing: No Slough/Fibrino No Wound Bed Granulation Amount: Large (67-100%) Exposed Structure Granulation Quality: Red, Pink Fascia Exposed: No Necrotic Amount: None Present (0%) Fat Layer (Subcutaneous Tissue) Exposed: Yes Tendon Exposed: No Muscle Exposed: No Joint Exposed: No Bone Exposed: No Treatment Notes Wound #12 (Foot) Wound Laterality: Plantar, Left, Medial Cleanser Soap and Water Discharge Instruction: Gently cleanse wound with antibacterial soap, rinse and pat dry prior to dressing wounds Peri-Wound Care Topical Vitamin AandD Ointment, 4(oz) tube Discharge Instruction: apply to red, broken down area Primary Dressing Apligraf Discharge Instruction: #2 applied today Secondary Dressing ABD Pad 5x9 (in/in) Discharge Instruction: Cover with ABD pad Gauze Discharge Instruction: As directed: dry, moistened with saline or moistened with Dakins Solution Secured With Medipore T - 68M Medipore H Soft Cloth Surgical T ape ape, 2x2 (in/yd) Kerlix Roll Sterile or Non-Sterile 6-ply 4.5x4 (yd/yd) Discharge Instruction: Apply  Kerlix as directed Compression Wrap Compression Stockings Add-Ons total contact cast Electronic Signature(s) Signed: 08/05/2022 12:25:01 PM By: Carlene Coria RN Entered By: Carlene Coria on 08/03/2022 10:08:26 -------------------------------------------------------------------------------- Vitals Details Patient Name: Date of Service: [name], [name] 08/03/2022 9:45 [name], Akeria [name] ([id number]) [id number] Page 10 of 10 Medical Record Number: [id number] Patient Account Number: [id number] Date of Birth/Sex: Treating RN: 21-Dec-1975 (46 y.o. Orvan Falconer Primary Care Janney Priego: Raelene Bott Other Clinician: Massie Kluver Referring Nohely Whitehorn: Treating Alazay Leicht/Extender: Eddie North in Treatment: 20 Vital Signs Time Taken: 09:55 Temperature (F): 98.4 Height (in): 69 Pulse (bpm): 51 Weight (lbs): 178 Respiratory Rate (breaths/min): 18 Body Mass Index (BMI): 26.3 Blood Pressure (mmHg): 94/55 Reference Range: 80 - 120 mg / dl Electronic Signature(s) Signed: 08/05/2022 12:25:01 PM By: Carlene Coria RN Entered By: Carlene Coria on 08/03/2022 09:58:42

## 2022-08-11 NOTE — Progress Notes (Addendum)
HORTENCE, CHARTER [name] ([id number]) [id number] Page 1 of 18 Visit Report for 08/03/2022 Chief Complaint Document Details Patient Name: Date of Service: [name], [name] 08/03/2022 9:45 [name]: [id number] Patient Account Number: [id number] Date of Birth/Sex: Treating RN: 08/08/1976 (46 y.o. Orvan Falconer Primary Care Provider: Raelene Bott Other Clinician: Massie Kluver Referring Provider: Treating Provider/Extender: Eddie North in Treatment: 20 Information Obtained from: Patient Chief Complaint 03/19/2021; patient referred by Dr. Luana Shu who has been looking after her left foot for quite [name] period of time for review of [name] nonhealing area in the left midfoot 03/12/2022; bilateral feet wounds and right lower extremity wound. Electronic Signature(s) Signed: 08/03/2022 3:09:38 PM By: Kalman Shan DO Entered By: Kalman Shan on 08/03/2022 10:38:19 -------------------------------------------------------------------------------- Cellular or Tissue Based Product Details Patient Name: Date of Service: Heather Hitch [name]. 08/03/2022 9:45 [name]: [id number] Patient Account Number: [id number] Date of Birth/Sex: Treating RN: [birth year] (46 y.o. Orvan Falconer Primary Care Provider: Raelene Bott Other Clinician: Massie Kluver Referring Provider: Treating Provider/Extender: Eddie North in Treatment: 20 Cellular or Tissue Based Product Type Wound #12 Left,Medial,Plantar Foot Applied to: Performed By: Physician Kalman Shan, MD Cellular or Tissue Based Product Type: Apligraf Level of Consciousness (Pre-procedure): Awake and Alert Pre-procedure Verification/Time Out Yes - 10:27 Taken: Location: genitalia / hands / feet / multiple digits Wound Size (sq cm): 0.85 Product Size (sq cm): 44 Waste Size (sq cm): 33 Waste Reason: wound size Amount of Product Applied (sq  cm): 11 Instrument Used: Blade, Forceps, Scissors Lot #: [id number] Order #: [id number] Expiration Date: 08/13/2022 AQUEELAH, COTRELL ([id number]) [id number] Page 2 of 18 Fenestrated: Yes Instrument: Blade Reconstituted: Yes Solution Type: normal saline Solution Amount: 60ml Lot #: [id number] Solution Expiration Date: 01/02/2024 Secured: Yes Secured With: Steri-Strips Dressing Applied: Yes Primary Dressing: Bolster Response to Treatment: Procedure was tolerated well Level of Consciousness (Post- Awake and Alert procedure): Post Procedure Diagnosis Same as Pre-procedure Notes Procedure updated due to information placed on wrong foot. Electronic Signature(s) Signed: 09/22/2022 4:36:10 PM By: Massie Kluver Entered By: Massie Kluver on 09/22/2022 10:31:55 -------------------------------------------------------------------------------- Debridement Details Patient Name: Date of Service: Heather Hitch [name]. 08/03/2022 9:45 [name]: [id number] Patient Account Number: [id number] Date of Birth/Sex: Treating RN: 1976-05-03 (46 y.o. [name], [name] Primary Care Provider: Raelene Bott Other Clinician: Massie Kluver Referring Provider: Treating Provider/Extender: Eddie North in Treatment: 20 Debridement Performed for Assessment: Wound #12 Left,Medial,Plantar Foot Performed By: Physician Kalman Shan, MD Debridement Type: Debridement Severity of Tissue Pre Debridement: Fat layer exposed Level of Consciousness (Pre-procedure): Awake and Alert Pre-procedure Verification/Time Out Yes - 10:15 Taken: Start Time: 10:15 T Area Debrided (L x W): otal 1.7 (cm) x 1 (cm) = 1.7 (cm) Tissue and other material debrided: Viable, Non-Viable, Callus, Slough, Subcutaneous, Slough Level: Skin/Subcutaneous Tissue Debridement Description: Excisional Instrument: Curette Bleeding: Minimum Hemostasis Achieved: Pressure End Time:  10:19 Response to Treatment: Procedure was tolerated well Level of Consciousness (Post- Awake and Alert procedure): Post Debridement Measurements of Total Wound Length: (cm) 1.7 Width: (cm) 0.5 Depth: (cm) 0.2 Volume: (cm) 0.134 Character of Wound/Ulcer Post Debridement: Stable Severity of Tissue Post Debridement: Fat layer exposed Post Procedure Diagnosis [name] ([id number]) [id number] Page 3 of 18 Same as Pre-procedure Electronic Signature(s) Signed: 08/03/2022 3:09:38 PM By: Kalman Shan DO Signed: 08/05/2022 12:25:01 PM By: Carlene Coria RN Signed: 08/11/2022 11:24:03 AM By: Massie Kluver Entered By: Massie Kluver  on 08/03/2022 10:20:34 -------------------------------------------------------------------------------- HPI Details Patient Name: Date of Service: [name] 08/03/2022 9:45 [name]: [id number] Patient Account Number: [id number] Date of Birth/Sex: Treating RN: 06-15-1976 (46 y.o. Orvan Falconer Primary Care Provider: Raelene Bott Other Clinician: Massie Kluver Referring Provider: Treating Provider/Extender: Eddie North in Treatment: 20 History of Present Illness HPI Description: 01/18/18-She is here for initial evaluation of the left great toe ulcer. She is [name] poor historian in regards to timeframe in detail. She states approximately 4 weeks ago she lacerated her toe on something in the house. She followed up with her primary care who placed her on Bactrim and ultimately [name] second dose of Bactrim prior to coming to wound clinic. She states she has been treating the toe with peroxide, Betadine and [name] Band-Aid. She did not check her blood sugar this morning but checked it yesterday morning it was 327; she is unaware of [name] recent A1c and there are no current records. She saw Dr. she would've orthopedics last week for an old injury to the left ankle, she states he did not see  her toe, nor did she bring it to his attention. She smokes approximately 1 pack cigarettes [name] day. Her social situation is concerning, she arrives this morning with her mother who appears extremely intoxicated/under the influence; her mother was asked to leave the room and be monitored by the patient's grandmother. The patient's aunt then accompanied the patient and the room throughout the rest of the appointment. We had [name] lengthy discussion regarding the deleterious effects of uncontrolled hyperglycemia and smoking as it relates to wound healing and overall health. She was strongly encouraged to decrease her smoking and get her diabetes under better control. She states she is currently on [name] diet and has cut down her [facility] consumption. The left toe is erythematous, macerated and slightly edematous with malodor present. The edema in her left foot is below her baseline, there is no erythema streaking. We will treat her with Santyl, doxycycline; we have ordered and xray, culture and provided [name] Peg assist surgical shoe and cultured the wound. 01/25/18-She is here in follow-up evaluation for [name] left great toe ulcer and presents with an abscess to her suprapubic area. She states her blood sugars remain elevated, feeling "sick" and if levels are below 250, but she is trying. She has made no attempt to decrease her smoking stating that we "can't take away her food in her cigarettes". She has been compliant with offloading using the PEG assist you. She is using Santyl daily. the culture obtained last week grew staph aureus and Enterococcus faecalis; continues on the doxycycline and Augmentin was added on Monday. The suprapubic area has erythema, no femoral variation, purple discoloration, minimal induration, was accessed with [name] cotton tip applicator with sanguinopurulent drainage, this was cultured, I suspect the current antibiotic treatment will cover and we will not add anything to her current treatment  plan. She was advised to go to urgent care or ER with any change in redness, induration or fever. 02/01/18-She is here in follow-up evaluation for left great toe ulcers and [name] new abdominal abscess from last week. She was able to use packing until earlier this week, where she "forgot it was there". She states she was feeling ill with GI symptoms last week and was not taking her antibiotic. She states her glucose levels have been predominantly less than 200, with occasional levels between 200-250. She thinks this was contributing  to her GI symptoms as they have resolved without intervention. There continues to be significant laceration to left toe, otherwise it clinically looks stable/improved. There is now less superficial opening to the lateral aspect of the great toe that was residual blister. We will transition to [facility] to all wounds, she will continue her Augmentin. If there is no change or deterioration next week for reculture. 02/08/18-She is here in follow-up evaluation for left great toe ulcer and abdominal ulcer. There is an improvement in both wounds. She has been wrapping her left toe with coban, not by our direction, which has created an area of discoloration to the medial aspect; she has been advised to NOT use coban secondary to her neuropathy. She states her glucose levels have been high over this last week ranging from 200-350, she continues to smoke. She admits to being less compliant with her offloading shoe. We will continue with same treatment plan and she will follow-up next week. 02/15/18-She is here in follow-up evaluation for left great toe ulcer and abdominal ulcer. The abdominal ulcer is epithelialized. The left great toe ulcer is improved and all injury from last week using the Coban wrap is resolved, the lateral ulcer is healed. She admits to noncompliance with wearing offloading shoe and admits to glucose levels being greater than 300 most of the week. She continues to  smoke and expresses no desire to quit. There is one area medially that probes deeper than it has historically, erythema to the toe and dorsal foot has consistently waxed and waned. There is no overt signs of cellulitis or infection but we will culture the wound for any occult infection given the new area of depth and erythema. We will hold off on sensitivities for initiation of antibiotic therapy. 02/22/18-She is here in follow up evaluation for left great toe ulcer. There is overall significant improvement in both wound appearance, erythema and edema with changes made last week. She was not initiated on antibiotic therapy. Culture obtained last week showed oxacillin sensitive staph aureus, sensitive to clindamycin. Clindamycin has been called into the pharmacy but she has been instructed to hold off on initiation secondary to overall clinical improvement and her history of antibiotic intolerance. She has been instructed to contact the clinic with any noted changes/deterioration and the wound, erythema, edema and/or pain. She will follow-up next week. She continues to smoke and her glucose levels remain elevated >250; she admits to compliance with offloading shoe 03/01/18 on evaluation today patient appears to be doing fairly well in regard to her left first toe ulcer. She has been tolerating the dressing changes with the [facility] Dressing without complication and overall this has definitely showed signs of improvement according to records as well is what the patient tells me today. I'm very pleased in that regard. She is having no pain today 03/08/18 She is here for follow up evaluation of [name] left great toe ulcer. She remains non-compliant with glucose control and smoking cessation; glucose levels consistently >200. She states that she got new shoe inserts/peg assist. She admits to compliance with offloading. Since my last evaluation there is significant improvement. We will switch to prisma at this  time and she will follow up next week. She is noted to be tachycardic at this appointment, heart rate 120s; she has [name] history of heart rate 70-130 according to our records. She admits to extreme agitation r/t personal issues; she was advised to monitor her heartrate and contact her physician if it does not  return to [name] more normal range (<100). She takes cardizem twice daily. ESPERANZA, MADRAZO [name] ([id number]) [id number] Page 4 of 18 03/15/18-She is here in follow-up evaluation for left great toe ulcer. She remains noncompliant with glucose control and smoking cessation. She admits to compliance with wearing offloading shoe. The ulcer is improved/stable and we will continue with the same treatment plan and she will follow-up next week 03/22/18-She is here for evaluation for left great toe ulcer. There continues to be significant improvement despite recurrent hyperglycemia (over 500 yesterday) and she continues to smoke. She has been compliant with offloading and we will continue with same treatment plan and she will follow-up next week. 03/29/18-She is here for evaluation for left great toe ulcer. Despite continuing to smoke and uncontrolled diabetes she continues to improve. She is compliant with offloading shoe. We will continue with the same treatment plan and she will follow-up next week 04/05/18- She is here in follow up evaluation for [name] left great toe ulcer; she presents with small pustule to left fifth toe (resembles ant bite). She admits to compliance with wearing offloading shoe; continues to smoke or have uncontrolled blood glucose control. There is more callus than usual with evidence of bleeding; she denies known trauma. 04/12/18-She is here for evaluation of left great toe ulcer. Despite noncompliance with glycemic control and smoking she continues to make improvement. She continues to wear offloading shoe. The pustule, that was identified last week, to the left fifth toe  is resolved. She will follow-up in 2 weeks 05/03/18-she is seen in follow-up evaluation for [name] left great toe ulcer. She is compliant with offloading, otherwise noncompliant with glycemic control and smoking. She has plateaued and there is minimal improvement noted. We will transition to [facility], replaced the insert to her surgical shoe and she will follow-up in one week 05/10/18- She is here in follow up evaluation for [name] left great toe ulcer. It appears stable despite measurement change. We will continue with same treatment plan and follow up next week. 05/24/18-She is seen in follow-up evaluation for [name] left great toe ulcer. She remains compliant with offloading, has made significant improvement in her diet, decreasing the amount of sugar/soda. She said her recent A1c was 10.9 which is lower than. She did see [name] diabetic nutritionist/educator yesterday. She continues to smoke. We will continue with the same treatment plan and she'll follow-up next week. 05/31/18- She is seen in follow-up evaluation for left great toe ulcer. She continues to remain compliant with offloading, continues to make improvement in her diet, increasing her water and decreasing the amount of sugar/soda. She does continue to smoke with no desire to quit. We will apply Prisma to the depth and Hydrofera Blue over. We have not received insurance authorization for oasis. She will follow up next week. 06/07/18-She is seen in follow-up evaluation for left great toe ulcer. It has stalled according to today's measurements although base appears stable. She says she saw [name] diabetic educator yesterday; her average blood sugars are less than 300 which is an improvement for her. She continues to smoke and states "that's my next step" She continues with water over soda. We will order for xray, culture and reinstate ace wrap compression prior to placing apligraf for next week. She is voicing no complaints or concerns. Her dressing will change to  iodoflex over the next week in preparation for apligraf. 06/14/18-She is seen in follow-up evaluation for left great toe ulcer. Plain film x-ray performed last week was negative  for osteomyelitis. Wound culture obtained last week grew strep B and OSSA; she is initiated on keflex and cefdinir today; there is erythema to the toe which could be from ace wrap compression, she has [name] history of wrapping too tight and has has been encouraged to maintain ace wraps that we place today. We will hold off on application of apligraf today, will apply next week after antibiotic therapy has been initiated. She admits today that she has resumed taking [name] shower with her foot/toe submerged in water, she has been reminded to keep foot/toe out of the bath water. She will be seen in follow up next week 06/21/18-she is seen in follow-up evaluation for left great toe ulcer. She is tolerating antibiotic therapy with no GI disturbance. The wound is stable. Apligraf was applied today. She has been decreasing her smoking, only had 4 cigarettes yesterday and 1 today. She continues being more compliant in diabetic diet. She will follow-up next week for evaluation of site, if stable will remove at 2 weeks. 06/28/18- She is here in follow up evalution. Apligraf was placed last week, she states the dressing fell off on Tuesday and she was dressing with hydrofera blue. She is healed and will be discharged from the clinic today. She has been instructed to continue with smoking cessation, continue monitoring glucose levels, offloading for an additional 4 weeks and continue with hydrofera blue for additional two weeks for any possible microscopic opening. Readmission: 08/07/18 on evaluation today patient presents for reevaluation concerning the ulcer of her right great toe. She was previously discharged on 06/28/18 healed. Nonetheless she states that this began to show signs of drainage she subsequently went to her primary care provider.  Subsequently an x-ray was performed on 08/01/18 which was negative. The patient was also placed on antibiotics at that time. Fortunately they should have been effective for the infection. Nonetheless she's been experiencing some improvement but still has [name] lot of drainage coming from the wound itself. 08/14/18 on evaluation today patient's wound actually does show signs of improvement in regard to the erythema at this point. She has completed the antibiotics. With that being said we did discuss the possibility of placing her in [name] total contact cast as of today although I think that I may want to give this just [name] little bit more time to ensure nothing recurrence as far as her infection is concerned. I do not want to put in the cast and risk infection at that time if things are not completely resolved. With that being said she is gonna require some debridement today. 08/21/18 on evaluation today patient actually appears to be doing okay in regard to her toe ulcer. She's been tolerating the dressing changes without complication. With that being said it does appear that she is ready and in fact I think it's appropriate for [place] to go ahead and initiate the total contact cast today. Nonetheless she will require some sharp debridement to prepare the wound for application. Overall I feel like things have been progressing well but we do need to do something to get this to close more readily. 08/24/18 patient seen today for reevaluation after having had the total contact cast applied on Tuesday. She seems to have done very well the wound appears to be doing great and overall I'm pleased with the progress that she's made. There were no abnormal areas of rubbing from the cast on her lower extremity. 08/30/18 on evaluation today patient actually appears to be completely healed in  regard to her plantar toe ulcer. She tells me at this point she's been having [name] lot of issues with the cast. She almost fell [name] couple of  times the state shall the step of her dog [name] couple times as well. This is been [name] very frustrating process for her other nonetheless she has completely healed the wound which is excellent news. Overall there does not appear to be the evidence of infection at this time which is great news. 09/11/18 evaluation today patient presents for follow-up concerning her great toe ulcer on the left which has unfortunately reopened since I last saw her which was only [name] couple of weeks ago. Unfortunately she was not able to get in to get the shoe and potentially the AFO that's gonna be necessary due to her left foot drop. She continues with offloading shoe but this is not enough to prevent her from reopening it appears. When we last had her in the total contact cast she did well from [name] healing standpoint but unfortunately the wound reopened as soon as she came out of the cast within just [name] couple of weeks. 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 This is also part of the reason why she falls on [name] regular basis. I just do not believe that is gonna be safe for [place] to reinitiate the total contact cast as last time we had this on she fell 3 times one week which is definitely not normal for her. 09/18/18 upon evaluation today the patient actually appears to be doing about the same in regard to her toe ulcer. She did not contact [facility] as I asked her to even though I had given her the prescription. In fact she actually states that she has no idea where the prescription is. She did apparently call [facility] and they told her that all she needed to do was bring the prescription in order to be able to be seen and work on getting the AFO for her left foot. With all that being said she still does not have an appointment and I'm not  sure were things stand that regard. I will give her [name] new prescription today in order to contact them to get this set up. 09/25/18 on evaluation today patient actually appears to be doing about the same in regard to her toes ulcer. She does have [name] small areas which seems to have [name] lot of callous buildup around the edge of the wound which is going to need sharp debridement today. She still is waiting to be scheduled for evaluation with [facility] for possibility of an AFO. She states there supposed to call her tomorrow to get this set up. Unfortunately it does appear that her foot specifically the toe area is showing signs of erythema. There does not appear to be any systemic infection which is in these good news. 10/02/18 on evaluation today patient actually appears to be doing about the same in regard to her toe ulcer. This really has not done too well although it's not significantly larger it's also not significantly smaller. She has been tolerating the dressing changes without complication. She actually has her appointment with [facility] and [facility] tomorrow to hopefully be measured for obtaining and AFO splint. I think this would be helpful preventing  this from reoccurring. We had contemplated starting the cast this week although to be honest I am reluctant to do that as she's been having nausea, vomiting, and seizure activity over the past three days. She has [name] history of seizures and have been told is nothing that can be done for these. With that being said I do believe that along with the seizures have the nausea vomiting which upon further questioning doesn't seem to be the normal for her and makes me concerned for the possibility of infection or something else going on. I discussed this with the patient and her mother during the office visit today. I do not feel the wound is effective but maybe something else. The responses this was "this just happens to her at times and we don't know why". They did  not seem to be interested in going to the hospital to have this checked out further. MARLY, SCHULD [name] ([id number]) [id number] Page 5 of 18 10/09/18 on evaluation today patient presents for follow-up concerning her ongoing toe ulcer. She has been tolerating the dressing changes without complication. Fortunately there does not appear to be any evidence of infection which is great news however I do think that the patient would benefit from going ahead for with the total contact cast. She's actually in [name] wheelchair today she tells me that she will use her walker if we initiate the cast. I was very specific about the fact that if we were gonna do the cast I wanted to make sure that she was using the walker in order to prevent any falls. She tells me she does not have stairs that she has to traverse on [name] regular basis at her home. She has not had any seizures since last week again that something that happens to her often she tells me she did talk to [employer] and they said that it may take up to three weeks to get the brace approved for her. Hopefully that will not take that long but nonetheless in the meantime I do think the cast could be of benefit. 10/12/18 on evaluation today patient appears to be doing rather well in regard to her toe ulcer. It's just been [name] few days and already this is significantly improved both as far as overall appearance and size. Fortunately there's no sign of infection. She is here for her first obligatory cast change. 10/19/18 Seen today for follow up and management of left great toe ulcer. Wound continues to show improvement. Noted small open area with seroussang drainage with palpation. Denies any increased pain or recent fevers during visit. She will continue calcium alginate with offloading shoe. Denies any questions or concerns during visit. 10/26/18 on evaluation today patient appears to be doing about the same as when I last saw her in regard to  her wound bed. Fortunately there does not appear to be any signs of infection. Unfortunately she continues to have [name] breakdown in regard to the toe region any time that she is not in the cast. It takes almost no time at all for this to happen. Nonetheless she still has not heard anything from the brace being made by [employer] as to when exactly this will be available to her. Fortunately there is no signs of infection at this time. 10/30/18 on evaluation today patient presents for application of the total contact cast as we just received him this morning. Fortunately we are gonna be able to apply this to her today which is great news. She continues  to have no significant pain which is good news. Overall I do feel like things have been improving while she was the cast is when she doesn't have [name] cast that things get worse. She still has not really heard anything from [place] regarding her brace. 11/02/18 upon evaluation today patient's wound already appears to be doing significantly better which is good news. Fortunately there does not appear to be any signs of infection also good news. Overall I do think the total contact cast as before is helping to heal this area unfortunately it's just not gonna likely keep the area closed and healed without her getting her brace at least. Again the foot drop is [name] significant issue for her. 11/09/18 on evaluation today patient appears to be doing excellent in regard to her toe ulcer which in fact is completely healed. Fortunately we finally got the situation squared away with the paperwork which was needed to proceed with getting her brace approved by Medicaid. I have filled that out unfortunately that information has been sent to the orthopedic office that I worked at 2 1/2 years ago and not tired Current wound care measures. Fortunately she seems to be doing very well at this time. 11/23/18 on evaluation today patient appears to be doing More Poorly Compared to Last Time I  Saw Her. At [facility] She Had Completely Healed. Currently she is continuing to have issues with reopening. She states that she just found out that the brace was approved through Medicaid now she just has to go get measured in order to have this fitted for her and then made. Subsequently she does not have an appointment for this yet that is going to complicate things we obviously cannot put her back in the cast if we do not have everything measured because they're not gonna be able to measure her foot while she is in the cast. 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 Obviously this is unfortunate and does have me somewhat worried as well. 11/30/18 on evaluation today patient's toe ulcer actually appears to be doing fairly well. The good news is she will be getting her brace in the shoes next week on Wednesday. Hopefully we will be able to get this to heal without having to go back in the cast however she may need the cast in order to get the wound completely heal and then go from there. Fortunately there's no signs of infection at this time. 12/07/18 on evaluation today patient fortunately did receive her brace and she states she could tell this definitely makes her walk better. With that being said she's been having issues with her toe where she noticed yesterday there was [name] lot of tissue that was loosing off this appears to be much larger than what it was previous. She also states that her leg has been read putting much across the top of her foot just about the ankle although this seems to be receiving somewhat. The total area is still red and appears to be someone infected as best I can tell. She is previously taken Bactrim and that may be [name] good option for her today as well. We are gonna see what I wound culture shows as well and I think that this is definitely appropriate. With that being said  outside of the culture I still need to initiate something in the interim and that's what  I'm gonna go ahead and select Bactrim is [name] good option for her. 12/14/18 on evaluation today patient appears to be doing better in regard to her left great toe ulcer as compared to last week's evaluation. There's still some erythema although this is significantly improved which is excellent news. Overall I do believe that she is making good progress is still gonna take some time before she is where I would like her to be from the standpoint of being able to place her back into the total contact cast. Hopefully we will be where we need to be by next week. 12/21/18 on evaluation today patient actually appears to be doing poorly in regard to her toe ulcer. She's been tolerating the dressing changes without complication. Fortunately there's no signs of systemic infection although she does have [name] lot of drainage from the toe ulcer and this does seem to be causing some issues at this point. She does have erythema on the distal portion of her toe that appears to be likely cellulitis. 12/28/18 on evaluation today patient actually appears to be doing [name] little better in my pinion in regard to her toe ulcer. With that being said she still does have some evidence of infection at this time and for her culture she had both E. coli as well as enterococcus as organisms noted on evaluation. For that reason I think that though the Keflex likely has treated the E. coli rather well this has really done nothing for the enterococcus. We are going to have to initiate treatment for this specifically. 01/04/19 on evaluation today patient's toe actually appears to be doing better from the standpoint of infection. She currently would like to see about putting the cash back on I think that this is appropriate as long as she takes care of it and keeps it from getting wet. She is gonna have some drainage we can definitely pass this up with Drawtex  and alginate to try to prevent as much drainage as possible from causing the problems. With that being said I do want to at least try her with the cast between now and Tuesday. If there any issues we can't continue to use it then I will discontinue the use of the cast at that point. 01/08/19 on evaluation today patient actually appears to be doing very well as far as her foot ulcer specifically the great toe on the left is concerned. She did have an area of rubbing on the medial aspect of her left ankle which again is from the cast. Fortunately there's no signs of infection at this point in this appears to be [name] very slight skin breakdown. The patient tells me she felt it rubbing but didn't think it was that bad. Fortunately there is no signs of active infection at this time which is good news. No fevers, chills, nausea, or vomiting noted at this time. 01/15/19 on evaluation today patient actually appears to be doing well in regard to her toe ulcer. Again as previous she seems to do well and she has the cast on which indicates to me that during the time she doesn't have [name] cast on she's putting way too much pressure on this region. Obviously I think that's gonna be an issue as with the current national emergency concerning the Covid-19 Virus it has been recommended that we discontinue the use of total contact casting by the chief medical officer of our company, Dr. Simona Huh. The reasoning is that if [name] patient becomes sick and cannot  come into have the cast removed they could not just leave this on for an additional two weeks. Obviously the hospitals also do not want to receive patient's who are sick into the emergency department to potentially contaminate the region and spread the Covid-19 Virus among other sick individuals within the hospital system. Therefore at this point we are suspending the use of total contact cast until the current emergency subsides. This was all discussed with the patient today as  well. 01/22/19 on evaluation today patient's wound on her left great toe appears to be doing slightly worse than previously noted last week. She tells me that she has been on this quite [name] bit in fact she tells me she's been awake for 38 straight hours. This is due to the fact that she's having to care for grandparents because nobody else will. She has been taking care of them for five the last seven days since I've seen her they both have dementia his is from [name] stroke and her grandmother's was progressive. Nonetheless she states even her mom who knows her condition and situation has only help two of those days to take care of them she's been taking care of the rest. Fortunately there does not appear to be any signs of active infection in regard to her toe at this point although obviously it doesn't look as good as it did previous. I think this is directly related to her not taking off the pressure and friction by way of taking things easy. Though I completely understand what's going on. ITALI, MCKENDRY [name] ([id number]) [id number] Page 6 of 18 01/29/19 on evaluation today patient's tools are actually appears to be showing some signs of improvement today compared to last week's evaluation as far as not necessarily the overall size of the wound but the fact that she has some new skin growth in between the two ends of the wound opening. Overall I feel like she has done well she states that she had [name] family member give her what sounds to be [name] CAM walker boot which has been helpful as well. 02/05/19 on evaluation today patient's wound bed actually appears to be doing significantly better in regard to her overall appearance of the size of the wound. With that being said she is still having an issue with offloading efficiently enough to get this to close. Apparently there is some signs of infection at this point as well unfortunately. Previously she's done well of Augmentin I really do not  see anything that needs to be culture currently but there theme and cellulitis of the foot that I'm seeing I'm gonna go ahead and place her on an antibiotic today to try to help clear this up. 02/12/2019 on evaluation today patient actually appears to be doing poorly in regard to her overall wound status. She tells me she has been using her offloading shoe but actually comes in today wearing her tennis shoe with the AFO brace. Again as I previously discussed with her this is really not sufficient to allow the area to heal appropriately. Nonetheless she continues to be somewhat noncompliant and I do wonder based on what she has told my nurse in the past as to whether or not [name] good portion of this noncompliance may be recreational drug and alcohol related. She has had [name] history of heroin overdose and this was fairly recently in the past couple of months that have been seeing her. Nonetheless overall I feel like her wound looks significantly worse today  compared to what it was previous. She still has significant erythema despite the Augmentin I am not sure that this is an appropriate medication for her infection I am also concerned that the infection may have gone down into her bone. 02/19/19 on evaluation today patient actually appears to be doing about the same in regard to her toe ulcer. Unfortunately she continues to show signs of bone exposure and infection at this point. There does not appear to be any evidence of worsening of the infection but I'm also not really sure that it's getting significantly better. She is on the Augmentin which should be sufficient for the Staphylococcus aureus infection that she has at this point. With that being said she may need IV antibiotics to more appropriately treat this. We did have [name] discussion today about hyperbaric option therapy. 02/28/19 on evaluation today patient actually appears to be doing much worse in regard to the wound on her left great toe as compared to  even my previous evaluation last week. Unfortunately this seems to be training in [name] pretty poor direction. Her toe was actually now starting to angle laterally and I can actually see the entire joint area of the proximal portion of the digit where is the distal portion of the digit again is no longer even in contact with the joint line. Unfortunately there's [name] lot more necrotic tissue around the edge and the toe appears to be showing signs of becoming gangrenous in my pinion. I'm very concerned about were things stand at this point. She did see infectious disease and they are planning to send in [name] prescription for Sivextro for her and apparently this has been approved. With that being said I don't think she should avoid taking this but at the same time I'm not sure that it's gonna be sufficient to save her toe at this point. She tells me that she still having to care for grandparents which I think is putting quite [name] bit of strain on her foot and specifically the total area and has caused this to break down even to [name] greater degree than would've otherwise been expected. 03/05/19 on evaluation today patient actually appears to be doing quite well in regard to her toe all things considering. She still has bone exposed but there appears to be much less your thing on overall the appearance of the wound and the toe itself is dramatically improved. She still does have some issues currently obviously with infection she did see vascular as well and there concerned that her blood flow to the toad. For that reason they are setting up for an angiogram next week. 03/14/19 on evaluation today patient appears to be doing very poor in regard to her toe and specifically in regard to the ulceration and the fact that she's starting to notice the toe was leaning even more towards the lateral aspect and the complete joint is visible on the proximal aspect of the joint. Nonetheless she's also noted [name] significant odor and the  tip of the toe is turning more dark and necrotic appearing. Overall I think she is getting worse not better as far as this is concerned. For that reason I am recommending at this point that she likely needs to be seen for likely amputation. READMISSION 03/19/2021 This is [name] patient that we cared for in this clinic for [name] prolonged period of time in 2019 and 2020 with [name] left foot and left first toe wound. I believe she ultimately became infected and underwent  [name] left first toe amputation. 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 Luana Shu. In December 2021 she had an ulcer on her right great toe as well as the fourth and fifth toes. 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 She also had an angiogram at that time and underwent angioplasty of the right anterior tibial artery. In any case she claims that the wound on the right foot is closed I did not look at this today which was probably an oversight although I think that should be done next week. 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 According to Dr. Deborra Medina last review that she was out of the area being cared for by another physician but recently came back to his attention. 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 [name] culture of this area showed E. coli apparently before she came back to see Dr. Luana Shu she was supposed to be receiving antibiotics but she did not really take them. Nor is she offloading this area at all. Finally her last hemoglobin A1c listed in epic was in March 2022 at 14.1 she says things are [name] lot better since then although I am not sure. 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 She had this wound at the time. During this admission she had E. coli sepsis [name] TEE was negative. 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 She is still [name] heavy cigarette smoker. She has [name] history of bipolar disorder seizure disorder ABI in our clinic was 1.05 6/1; left midfoot in the setting of [name] TMA done previously. Round circular wound with [name] "knuckle" of protruding tissue. The problem is that the knuckle was not attached to any of the surrounding granulation and this probed proximally widely I removed [name] large portion of this tissue. This wound goes with considerable undermining laterally. I do not feel any bone there was no purulence but this is [name] deep wound. 6/8; in spite of the debridement I did last week. She arrives with [name] wound looking exactly the same. [name] protruding "knuckle" of tissue nonadherent to most of the surrounding tissue. There is considerable depth around this from 6-12 o'clock at 2.7 cm and undermining of 1 cm. This does not look overtly infected and the x- ray I did last week was negative for any osseous abnormalities. We have been using silver collagen 6/15; deep tissue culture I did last week showed moderate staph aureus and moderate Pseudomonas. This will definitely require prolonged antibiotic therapy. The pathology on the protuberant area was negative for malignancy fungus etc. 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 We have been using silver collagen. I am going to be prescribing Levaquin for 2 weeks. Her CT scan of the foot is down for 7/5 6/22; CT scan of the foot on 7 5. She says she has hardware in the left leg from her previous fracture. She is on the Levaquin for the deep tissue culture I did that showed methicillin sensitive staph aureus and Pseudomonas. I gave her [name] 2-week supply and she will  have  another week. She arrives in clinic today with the same protuberant tissue however this is nonadherent to the tissue surrounding it. I am really at [name] loss to explain this unless there is underlying deep tissue infection 6/29; patient presents for 1 week follow-up. She has been using collagen to the wound bed. She reports taking her antibiotics as prescribed.She has no complaints or issues today. She denies signs of infection. 7/6; patient presents for one week followup. She has been using collagen to the wound bed. She states she is taking Levaquin however at times she is not able to keep it down. She denies signs of infection. 7/13; patient presents for 1 week follow-up. She has been using silver alginate to the wound bed. She still has nausea when taking her antibiotics. She denies signs of infection. 7/20; patient presents for 1 week follow-up. She has been using silver alginate with gentamicin cream to the wound bed. She denies any issues and has no complaints today. She denies signs of infection. 7/27; patient presents for 1 week follow-up. She continues to use silver alginate with gentamicin cream to the wound bed. She reports starting her antibiotics. She has no issues or complaints. Overall she reports stability to the wound. 8/3; patient presents for 1 week follow-up. She has been using silver alginate with gentamicin cream to the wound bed. She reports completing all antibiotics. She has no issues or complaints today. She denies signs of infection. 8/17; patient presents for 2-week follow-up. He is to use silver alginate to the wound bed. She has no issues or complaints today. She denies signs of infection. She reports her pain has improved in her foot since last clinic visit 8/24; patient presents for 1 week follow-up. She continues to use silver alginate to the wound bed. She has no issues or complaints. She denies signs of TIFFAY, PINETTE [name] ([id number])  [id number] Page 7 of 18 infection. Pain is stable. 9/7; patient presents for follow-up. She missed her last week appointment due to feeling ill. She continues to use silver alginate. She has [name] new wound to the right lower extremity that is covered in eschar. She states It occurred over the past week and has no idea how it started. She currently denies signs of infection. 9/14; patient presents for follow-up. T the left foot wound she has been using gentamicin cream and silver alginate. T the right lower extremity wound she has o o been keeping this covered and has not obtain Santyl. 9/21; patient presents for follow-up. She reports using gentamicin cream and silver alginate to the left foot and Santyl to the right lower extremity wound. She has no issues or complaints today. She denies signs of infection. 9/28; patient presents for follow-up. She reports [name] new wound to her right heel. She states this occurred [name] few days ago and is progressively gotten worse. She has been trying to clean the area with [name] Q-tip and Santyl. She reports stability in the other 2 wounds. She has been using gentamicin cream and silver alginate to the left foot and Santyl to the right lower extremity wound. 10/12; patient presents for follow-up. She reports improvement to the wound beds. She is seeing vein and vascular to discuss the potential of [name] left BKA. She states they are going to do an arteriogram. She continues to use silver alginate with dressing changes to her wounds. 11/2; patient presents for follow-up. She states she has not been doing dressing changes to the wound beds. She states she is  not able to offload the areas. She reports chronic pain to her left foot wound. 11/9; patient presents for follow-up. She came in with only socks on. She states she forgot to put on shoes. It is unclear if she is doing any dressing changes. She currently denies systemic signs of infection. 11/16;  patient presents for follow-up. She came again only with socks on. She states she does not wear shoes ever. It is unclear if she does dressing changes. She currently denies systemic signs of infection. 11/23; patient presents for follow-up. She wore her shoes today. It still unclear exactly what dressing she is using for each wound but she did states she obtained Dakin's solution and has been using this to the left foot wound. She currently denies signs of infection. 11/30; patient presents for follow-up. She has no issues or complaints today. She currently denies signs of infection. 12/7; patient presents for follow-up. She has no issues or complaints today. She has been using Hydrofera Blue to the right heel wound and Dakin solution to the left foot wound. Her right anterior leg wound is healed. She currently denies signs of infection. 12/14; patient presents for follow-up. She has been using Hydrofera Blue to the right heel and Dakin's to the left foot wounds. She has no issues or complaints today. She denies signs of infection. 12/21; patient presents for follow-up. She reports using Hydrofera Blue to the right heel and Dakin's to the left foot wound. She denies signs of infection. 12/28; patient presents for follow-up. She continues to use Dakin's to the left foot wound and Hydrofera Blue to the right heel wound. She denies signs of infection. 1/4; patient presents for follow-up. She has no issues or complaints today. She denies signs of infection. 1/11; patient presents for follow-up. It is unclear if she has been dressing these wounds over the past week. She currently denies signs of infection. 1/18; patient presents for follow-up. She states she has been using Dakin's wet-to-dry dressings to the left foot. She has been using Hydrofera Blue to the right foot foot wound. She states that the anterior right leg wound has reopened and draining serous fluid. She denies signs of infection. 1/25;  patient presents for follow-up. She has no issues or complaints today. 2/1; patient presents for follow-up. She has no issues or complaints today. She denies signs of infection. 2/8; patient presents for follow-up. She has lost her surgical shoes. She did not have [name] dressing to the right heel wound. She currently denies signs of infection. 2/15; patient presents for follow-up. She reports more pain to the right heel today. She denies purulent drainage Or fever/chills 2/22; patient presents for follow-up. She reports taking clindamycin over the past week. She states that she continues to have pain to her right heel. She reports purulent drainage. Readmission 03/16/2022 Ms. Sumire Halbleib is [name] 46 year old female with [name] past medical history of type 2 diabetes, osteomyelitis to her feet, chronic systolic heart failure and bipolar disorder that presents to the clinic for bilateral feet wounds and right lower extremity wound. She was last seen in our clinic on 12/15/2021. At that time she had purulent drainage coming out of her right plantar foot and I recommended she go to the ED. She states she went to [facility] and has been there for the past 3 months. I cannot see the records. She states she had OR debridement and was on several weeks of IV antibiotics while inpatient. Since discharge she has not been  taking care of the wound beds. She had nothing on her feet other than socks today. She currently denies signs of infection. 5/31; patient presents for follow-up. She has been using Dakin's wet-to-dry dressings to the wound beds on her feet bilaterally and antibiotic ointment to the right anterior leg wound. She had [name] wound culture done at last clinic visit that showed moderate Pseudomonas aeruginosa sensitive to ciprofloxacin. She currently denies systemic signs of infection. 6/14; patient presents for follow-up. She received Keystone 5 days ago and has been using this on the wound beds. She  states that last week she had to go to the hospital because she had increased warmth and erythema to the right foot. She was started on 2 oral antibiotics. She states she has been taking these. She currently denies systemic signs of infection. She has no issues or complaints today. 6/21; patient presents for follow-up. She states she has been using Keystone antibiotics to the wound beds. She has no issues or complaints today. She denies signs of infection. 6/28; patient presents for follow-up. She has been using Keystone antibiotics to the wound beds. She has no issues or complaints today. 7/12; patient presents for follow-up. Has been using Keystone antibiotics to the wound beds with calcium alginate. She has no issues or complaints today. She never followed up with her orthopedic surgeon who did the OR debridement to the right foot. We discussed the total contact cast for the left foot and patient would like to do this next week. 7/19; patient presents for follow-up. She has been using Keystone antibiotics with calcium alginate to the wound beds. She has no issues or complaints today. Patient is in agreement to do the total contact cast of the left foot today. She knows to return later this week for the obligatory cast change. 05-13-2022 upon evaluation today patient's wound which she has the cast of the left leg actually appears to be doing significantly better. Fortunately I do not see any signs of active infection locally or systemically which is great news and overall I am extremely pleased with where we stand currently. 7/26; patient presents for follow-up. She has [name] cast in place for the past week. She states it irritated her shin. Other than that she tolerated the cast well. She states she would like [name] break for 1 week from the cast. We have been using Keystone antibiotic and Aquacel to both wound beds. She denies signs of infection. MANUELA, HALBUR [name] ([id number])  [id number] Page 8 of 18 8/2; patient presents for follow-up. She has been using Keystone and Aquacel to the wound beds. She denies any issues and has no complaints. She is agreeable to have the cast placed today for the left leg. 06-03-2022 upon evaluation today patient appears to be doing well with regard to her wound she saw some signs of improvement which is great news. Fortunately I do not see any evidence of active infection locally or systemically at this time which is great news. No fevers, chills, nausea, vomiting, or diarrhea. 8/16; patient presents for follow-up. She has no issues or complaints today. We have been using Keystone and Aquacel to the wound beds. The left lower extremity is in [name] total contact cast. She is tolerated this well. 8/23; patient presents for follow-up. She has had the total contact cast on the left leg for the past week. Unfortunately this has rubbed and broken down the skin to the medial foot. She currently denies signs of infection. She has been using  Keystone antibiotic to the right plantar foot wound. 8/30; patient presents for follow-up. We have held off on the total contact cast for the left leg for the past week. Her wound on the left foot has improved and the previous surrounding breakdown of skin has epithelialized. She has been using Keystone antibiotic to both wound beds. She has no issues or complaints today. She denies signs of infection. 9/6; patient presents for follow-up. She has ordered her's Keystone antibiotic and this is arriving this week. She has been doing Dakin's wet-to-dry dressings to the wound beds. She denies signs of infection. She is agreeable to the total contact cast today. 9/13; patient presents for follow-up. She states that the cast caused her left leg shin to be sore. She would like to take [name] break from the cast this week. She has been using Keystone antibiotic to the right plantar foot wound. She denies  signs of infection. 9/20; patient presents for follow-up. She has been using Keystone antibiotics to the wound beds with calcium alginate to the right foot wound and Hydrofera Blue to the left foot wound. She is agreeable to having the cast placed today. She has been approved for Apligraf and we will order this for next clinic visit. 9/27; patient presents for follow-up. We have been using Keystone antibiotic with Hydrofera Blue to the left foot wound under [name] total contact cast. T the right o foot wound she has been using Keystone antibiotic and calcium alginate. She declines [name] total contact cast today. Apligraf is available for placement and she would like to proceed with this. 07-28-2022 upon evaluation today patient appears to be doing well currently in regard to her wound. She is actually showing signs of significant improvement which is great news. Fortunately I do not see any evidence of active infection locally nor systemically at this time. She has been seeing Dr. Heber [place] and to be honest has been doing very well with the cast. Subsequently she comes in today with [name] cast on and we did reapply that today as well. She did not really want to she try to talk me out of that but I explained that if she wanted to heal this is really the right way to go. Patient voiced understanding. In regard to her right foot this is actually [name] lot better compared to the last time I saw her which is also great news. 10/11; patient presents for follow-up. Apligraf and the total contact cast was placed to the left leg at last clinic visit. She states that her right foot wound had burning pain to it with the placement of Apligraf to this area. She has been doing [place] over this area. She denies signs of infection including increased warmth, erythema or purulent drainage. Electronic Signature(s) Signed: 08/03/2022 3:09:38 PM By: Kalman Shan DO Entered By: Kalman Shan on 08/03/2022  10:40:04 -------------------------------------------------------------------------------- Physical Exam Details Patient Name: Date of Service: Heather Hitch [name]. 08/03/2022 9:45 [name]: [id number] Patient Account Number: [id number] Date of Birth/Sex: Treating RN: July 26, 1976 (46 y.o. Orvan Falconer Primary Care Provider: Raelene Bott Other Clinician: Massie Kluver Referring Provider: Treating Provider/Extender: Linna Hoff Weeks in Treatment: 72 Constitutional . Cardiovascular . Psychiatric . Notes Right foot: T the plantar heel there is an incision site with increased depth. probes close to bone. Left foot: T the medial aspect there is an open wound with o o granulation tissue and nonviable tissue with surrounding callus. Again no signs of infection. [place],  Kalkidan [name] ([id number]) [id number] Page 9 of 18 Electronic Signature(s) Signed: 08/03/2022 3:09:38 PM By: Kalman Shan DO Entered By: Kalman Shan on 08/03/2022 10:45:39 -------------------------------------------------------------------------------- Physician Orders Details Patient Name: Date of Service: Heather Hitch [name]. 08/03/2022 9:45 [name]: [id number] Patient Account Number: [id number] Date of Birth/Sex: Treating RN: April 29, 1976 (46 y.o. Orvan Falconer Primary Care Provider: Raelene Bott Other Clinician: Massie Kluver Referring Provider: Treating Provider/Extender: Eddie North in Treatment: 20 Verbal / Phone Orders: No Diagnosis Coding Follow-up Appointments Return Appointment in 1 week. Nurse Visit as needed Bathing/ Shower/ Hygiene May shower with wound dressing protected with water repellent cover or cast protector. No tub bath. Anesthetic (Use 'Patient Medications' Section for Anesthetic Order Entry) Lidocaine applied to wound bed Cellular or Tissue Based Products Cellular or  Tissue Based Product Type: - apligraf #3 applied to left foot Edema Control - Lymphedema / Segmental Compressive Device / Other Elevate, Exercise Daily and [name] void Standing for Long Periods of Time. Elevate legs to the level of the heart and pump ankles as often as possible Elevate leg(s) parallel to the floor when sitting. Off-Loading Total Contact Cast to Left Lower Extremity - hold cast for this week contact cast #8 applied Don't get cast wet Other: - keep pressure off of feet. Medications-Please add to medication list. Keystone Compound - Keystone gel right foot Wound Treatment Wound #11 - Foot Wound Laterality: Plantar, Right Cleanser: Soap and Water Every Other Day/30 Days Discharge Instructions: Gently cleanse wound with antibacterial soap, rinse and pat dry prior to dressing wounds Topical: keystone gel Every Other Day/30 Days Prim Dressing: Aquacel Extra Hydrofiber Dressing, 2x2 (in/in) ary Every Other Day/30 Days Secondary Dressing: ABD Pad 5x9 (in/in) (Generic) Every Other Day/30 Days Discharge Instructions: Cover with ABD pad Secured With: Medipore T - 31M Medipore H Soft Cloth Surgical T ape ape, 2x2 (in/yd) (Generic) Every Other Day/30 Days Secured With: Kerlix Roll Sterile or Non-Sterile 6-ply 4.5x4 (yd/yd) (Generic) Every Other Day/30 Days Discharge Instructions: Apply Kerlix as directed Add-Ons: total contact cast Every Other Day/30 Days JAJAIRA, RUIS [name] ([id number]) [id number] Page 10 of 18 Wound #12 - Foot Wound Laterality: Plantar, Left, Medial Cleanser: Soap and Water 1 x Per Week/30 Days Discharge Instructions: Gently cleanse wound with antibacterial soap, rinse and pat dry prior to dressing wounds Topical: Vitamin AandD Ointment, 4(oz) tube 1 x Per Week/30 Days Discharge Instructions: apply to red, broken down area Prim Dressing: Apligraf 1 x Per Week/30 Days ary Discharge Instructions: #2 applied today Secondary Dressing: ABD Pad  5x9 (in/in) (Generic) 1 x Per Week/30 Days Discharge Instructions: Cover with ABD pad Secondary Dressing: Gauze 1 x Per Week/30 Days Discharge Instructions: As directed: dry, moistened with saline or moistened with Dakins Solution Secured With: Medipore T - 31M Medipore H Soft Cloth Surgical T ape ape, 2x2 (in/yd) (Generic) 1 x Per Week/30 Days Secured With: [employer] Sterile or Non-Sterile 6-ply 4.5x4 (yd/yd) (Generic) 1 x Per Week/30 Days Discharge Instructions: Apply Kerlix as directed Add-Ons: total contact cast 1 x Per Week/30 Days Electronic Signature(s) Signed: 08/03/2022 3:09:38 PM By: Kalman Shan DO Entered By: Kalman Shan on 08/03/2022 11:16:14 -------------------------------------------------------------------------------- Problem List Details Patient Name: Date of Service: Heather Hitch [name]. 08/03/2022 9:45 [name]: [id number] Patient Account Number: [id number] Date of Birth/Sex: Treating RN: 06-19-1976 (46 y.o. Orvan Falconer Primary Care Provider: Raelene Bott Other Clinician: Massie Kluver Referring Provider: Treating Provider/Extender: Eddie North in  Treatment: 20 Active Problems ICD-10 Encounter Code Description 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 03/16/2022 No Yes E11.621 Type 2 diabetes mellitus with foot ulcer 03/16/2022 No Yes E11.42 Type 2 diabetes mellitus with diabetic polyneuropathy 03/16/2022 No Yes L97.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 03/16/2022 No Yes of skin Mcelhinney, Aveya [name] ([id number]) [id number] Page 11 of 18 Inactive Problems Resolved Problems Electronic Signature(s) Signed: 08/03/2022 3:09:38 PM By: Kalman Shan DO Entered  By: Kalman Shan on 08/03/2022 10:38:14 -------------------------------------------------------------------------------- Progress Note Details Patient Name: Date of Service: Heather Hitch [name]. 08/03/2022 9:45 [name]: [id number] Patient Account Number: [id number] Date of Birth/Sex: Treating RN: [birth year] (46 y.o. Orvan Falconer Primary Care Provider: Raelene Bott Other Clinician: Massie Kluver Referring Provider: Treating Provider/Extender: Eddie North in Treatment: 20 Subjective Chief Complaint Information obtained from Patient 03/19/2021; patient referred by Dr. Luana Shu who has been looking after her left foot for quite [name] period of time for review of [name] nonhealing area in the left midfoot 03/12/2022; bilateral feet wounds and right lower extremity wound. History of Present Illness (HPI) 01/18/18-She is here for initial evaluation of the left great toe ulcer. She is [name] poor historian in regards to timeframe in detail. She states approximately 4 weeks ago she lacerated her toe on something in the house. She followed up with her primary care who placed her on Bactrim and ultimately [name] second dose of Bactrim prior to coming to wound clinic. She states she has been treating the toe with peroxide, Betadine and [name] Band-Aid. She did not check her blood sugar this morning but checked it yesterday morning it was 327; she is unaware of [name] recent A1c and there are no current records. She saw Dr. she would've orthopedics last week for an old injury to the left ankle, she states he did not see her toe, nor did she bring it to his attention. She smokes approximately 1 pack cigarettes [name] day. Her social situation is concerning, she arrives this morning with her mother who appears extremely intoxicated/under the influence; her mother was asked to leave the room and be monitored by the patient's grandmother. The patient's aunt then accompanied the patient and the  room throughout the rest of the appointment. We had [name] lengthy discussion regarding the deleterious effects of uncontrolled hyperglycemia and smoking as it relates to wound healing and overall health. She was strongly encouraged to decrease her smoking and get her diabetes under better control. She states she is currently on [name] diet and has cut down her [facility] consumption. The left toe is erythematous, macerated and slightly edematous with malodor present. The edema in her left foot is below her baseline, there is no erythema streaking. We will treat her with Santyl, doxycycline; we have ordered and xray, culture and provided [name] Peg assist surgical shoe and cultured the wound. 01/25/18-She is here in follow-up evaluation for [name] left great toe ulcer and presents with an abscess to her suprapubic area. She states her blood sugars remain elevated, feeling "sick" and if levels are below 250, but she is trying. She has made no attempt to decrease her smoking stating that we "can't take away her food in her cigarettes". She has been compliant with offloading using the PEG assist you. She  is using Santyl daily. the culture obtained last week grew staph aureus and Enterococcus faecalis; continues on the doxycycline and Augmentin was added on Monday. The suprapubic area has erythema, no femoral variation, purple discoloration, minimal induration, was accessed with [name] cotton tip applicator with sanguinopurulent drainage, this was cultured, I suspect the current antibiotic treatment will cover and we will not add anything to her current treatment plan. She was advised to go to urgent care or ER with any change in redness, induration or fever. 02/01/18-She is here in follow-up evaluation for left great toe ulcers and [name] new abdominal abscess from last week. She was able to use packing until earlier this week, where she "forgot it was there". She states she was feeling ill with GI symptoms last week and was not taking  her antibiotic. She states her glucose levels have been predominantly less than 200, with occasional levels between 200-250. She thinks this was contributing to her GI symptoms as they have resolved without intervention. There continues to be significant laceration to left toe, otherwise it clinically looks stable/improved. There is now less superficial opening to the lateral aspect of the great toe that was residual blister. We will transition to [facility] to all wounds, she will continue her Augmentin. If there is no change or deterioration next week for reculture. 02/08/18-She is here in follow-up evaluation for left great toe ulcer and abdominal ulcer. There is an improvement in both wounds. She has been wrapping her left toe with coban, not by our direction, which has created an area of discoloration to the medial aspect; she has been advised to NOT use coban secondary to her neuropathy. She states her glucose levels have been high over this last week ranging from 200-350, she continues to smoke. She admits to being less compliant with her offloading shoe. We will continue with same treatment plan and she will follow-up next week. 02/15/18-She is here in follow-up evaluation for left great toe ulcer and abdominal ulcer. The abdominal ulcer is epithelialized. The left great toe ulcer is improved and all injury from last week using the Coban wrap is resolved, the lateral ulcer is healed. She admits to noncompliance with wearing offloading shoe and admits to glucose levels being greater than 300 most of the week. She continues to smoke and expresses no desire to quit. There is one area medially that probes deeper than it has historically, erythema to the toe and dorsal foot has consistently waxed and waned. There is no overt signs of cellulitis or infection but we will culture the wound for any occult infection given the new area of depth and erythema. We will hold off on sensitivities for  initiation of antibiotic therapy. 02/22/18-She is here in follow up evaluation for left great toe ulcer. There is overall significant improvement in both wound appearance, erythema and edema with changes made last week. She was not initiated on antibiotic therapy. Culture obtained last week showed oxacillin sensitive staph aureus, sensitive to clindamycin. Clindamycin has been called into the pharmacy but she has been instructed to hold off on initiation secondary to overall clinical improvement and her history of antibiotic intolerance. She has been instructed to contact the clinic with any noted changes/deterioration and the wound, erythema, edema and/or ERMINA, OBERMAN [name] ([id number]) [id number] Page 12 of 18 pain. She will follow-up next week. She continues to smoke and her glucose levels remain elevated >250; she admits to compliance with offloading shoe 03/01/18 on evaluation today patient appears to be doing  fairly well in regard to her left first toe ulcer. She has been tolerating the dressing changes with the [facility] Dressing without complication and overall this has definitely showed signs of improvement according to records as well is what the patient tells me today. I'm very pleased in that regard. She is having no pain today 03/08/18 She is here for follow up evaluation of [name] left great toe ulcer. She remains non-compliant with glucose control and smoking cessation; glucose levels consistently >200. She states that she got new shoe inserts/peg assist. She admits to compliance with offloading. Since my last evaluation there is significant improvement. We will switch to prisma at this time and she will follow up next week. She is noted to be tachycardic at this appointment, heart rate 120s; she has [name] history of heart rate 70-130 according to our records. She admits to extreme agitation r/t personal issues; she was advised to monitor her heartrate and contact her  physician if it does not return to [name] more normal range (<100). She takes cardizem twice daily. 03/15/18-She is here in follow-up evaluation for left great toe ulcer. She remains noncompliant with glucose control and smoking cessation. She admits to compliance with wearing offloading shoe. The ulcer is improved/stable and we will continue with the same treatment plan and she will follow-up next week 03/22/18-She is here for evaluation for left great toe ulcer. There continues to be significant improvement despite recurrent hyperglycemia (over 500 yesterday) and she continues to smoke. She has been compliant with offloading and we will continue with same treatment plan and she will follow-up next week. 03/29/18-She is here for evaluation for left great toe ulcer. Despite continuing to smoke and uncontrolled diabetes she continues to improve. She is compliant with offloading shoe. We will continue with the same treatment plan and she will follow-up next week 04/05/18- She is here in follow up evaluation for [name] left great toe ulcer; she presents with small pustule to left fifth toe (resembles ant bite). She admits to compliance with wearing offloading shoe; continues to smoke or have uncontrolled blood glucose control. There is more callus than usual with evidence of bleeding; she denies known trauma. 04/12/18-She is here for evaluation of left great toe ulcer. Despite noncompliance with glycemic control and smoking she continues to make improvement. She continues to wear offloading shoe. The pustule, that was identified last week, to the left fifth toe is resolved. She will follow-up in 2 weeks 05/03/18-she is seen in follow-up evaluation for [name] left great toe ulcer. She is compliant with offloading, otherwise noncompliant with glycemic control and smoking. She has plateaued and there is minimal improvement noted. We will transition to [facility], replaced the insert to her surgical shoe and she will follow-up  in one week 05/10/18- She is here in follow up evaluation for [name] left great toe ulcer. It appears stable despite measurement change. We will continue with same treatment plan and follow up next week. 05/24/18-She is seen in follow-up evaluation for [name] left great toe ulcer. She remains compliant with offloading, has made significant improvement in her diet, decreasing the amount of sugar/soda. She said her recent A1c was 10.9 which is lower than. She did see [name] diabetic nutritionist/educator yesterday. She continues to smoke. We will continue with the same treatment plan and she'll follow-up next week. 05/31/18- She is seen in follow-up evaluation for left great toe ulcer. She continues to remain compliant with offloading, continues to make improvement in her diet, increasing her  water and decreasing the amount of sugar/soda. She does continue to smoke with no desire to quit. We will apply Prisma to the depth and Hydrofera Blue over. We have not received insurance authorization for oasis. She will follow up next week. 06/07/18-She is seen in follow-up evaluation for left great toe ulcer. It has stalled according to today's measurements although base appears stable. She says she saw [name] diabetic educator yesterday; her average blood sugars are less than 300 which is an improvement for her. She continues to smoke and states "that's my next step" She continues with water over soda. We will order for xray, culture and reinstate ace wrap compression prior to placing apligraf for next week. She is voicing no complaints or concerns. Her dressing will change to iodoflex over the next week in preparation for apligraf. 06/14/18-She is seen in follow-up evaluation for left great toe ulcer. Plain film x-ray performed last week was negative for osteomyelitis. Wound culture obtained last week grew strep B and OSSA; she is initiated on keflex and cefdinir today; there is erythema to the toe which could be from ace wrap  compression, she has [name] history of wrapping too tight and has has been encouraged to maintain ace wraps that we place today. We will hold off on application of apligraf today, will apply next week after antibiotic therapy has been initiated. She admits today that she has resumed taking [name] shower with her foot/toe submerged in water, she has been reminded to keep foot/toe out of the bath water. She will be seen in follow up next week 06/21/18-she is seen in follow-up evaluation for left great toe ulcer. She is tolerating antibiotic therapy with no GI disturbance. The wound is stable. Apligraf was applied today. She has been decreasing her smoking, only had 4 cigarettes yesterday and 1 today. She continues being more compliant in diabetic diet. She will follow-up next week for evaluation of site, if stable will remove at 2 weeks. 06/28/18- She is here in follow up evalution. Apligraf was placed last week, she states the dressing fell off on Tuesday and she was dressing with hydrofera blue. She is healed and will be discharged from the clinic today. She has been instructed to continue with smoking cessation, continue monitoring glucose levels, offloading for an additional 4 weeks and continue with hydrofera blue for additional two weeks for any possible microscopic opening. Readmission: 08/07/18 on evaluation today patient presents for reevaluation concerning the ulcer of her right great toe. She was previously discharged on 06/28/18 healed. Nonetheless she states that this began to show signs of drainage she subsequently went to her primary care provider. Subsequently an x-ray was performed on 08/01/18 which was negative. The patient was also placed on antibiotics at that time. Fortunately they should have been effective for the infection. Nonetheless she's been experiencing some improvement but still has [name] lot of drainage coming from the wound itself. 08/14/18 on evaluation today patient's wound actually does  show signs of improvement in regard to the erythema at this point. She has completed the antibiotics. With that being said we did discuss the possibility of placing her in [name] total contact cast as of today although I think that I may want to give this just [name] little bit more time to ensure nothing recurrence as far as her infection is concerned. I do not want to put in the cast and risk infection at that time if things are not completely resolved. With that being said she is [place]  require some debridement today. 08/21/18 on evaluation today patient actually appears to be doing okay in regard to her toe ulcer. She's been tolerating the dressing changes without complication. With that being said it does appear that she is ready and in fact I think it's appropriate for [place] to go ahead and initiate the total contact cast today. Nonetheless she will require some sharp debridement to prepare the wound for application. Overall I feel like things have been progressing well but we do need to do something to get this to close more readily. 08/24/18 patient seen today for reevaluation after having had the total contact cast applied on Tuesday. She seems to have done very well the wound appears to be doing great and overall I'm pleased with the progress that she's made. There were no abnormal areas of rubbing from the cast on her lower extremity. 08/30/18 on evaluation today patient actually appears to be completely healed in regard to her plantar toe ulcer. She tells me at this point she's been having [name] lot of issues with the cast. She almost fell [name] couple of times the state shall the step of her dog [name] couple times as well. This is been [name] very frustrating process for her other nonetheless she has completely healed the wound which is excellent news. Overall there does not appear to be the evidence of infection at this time which is great news. 09/11/18 evaluation today patient presents for follow-up concerning her  great toe ulcer on the left which has unfortunately reopened since I last saw her which was only [name] couple of weeks ago. Unfortunately she was not able to get in to get the shoe and potentially the AFO that's gonna be necessary due to her left foot drop. She continues with offloading shoe but this is not enough to prevent her from reopening it appears. When we last had her in the total contact cast she did well from [name] healing standpoint but unfortunately the wound reopened as soon as she came out of the cast within just [name] couple of weeks. 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 This is also part of the reason why she falls on [name] regular basis. I just do not believe that is gonna be safe for [place] to reinitiate the total contact cast as last time we had this on she fell 3 times one week which is definitely not normal for her. 09/18/18 upon evaluation today the patient actually appears to be doing about the same in regard to her toe ulcer. She did not contact [facility] as I asked her to even though I had given her the prescription. In fact she actually states that she has no idea where the prescription is. She did apparently call [facility] and they told her that all she needed to do was bring the prescription in order to be able to be seen and work on getting the AFO for her left foot. With all that being said she still does not have an appointment and I'm not sure were things stand that regard. I will give her [name] new prescription today in order to contact them to get this set up. 09/25/18 on evaluation today patient actually appears to be doing about the same in regard to her toes ulcer.  She does have [name] small areas which seems to have [name] lot of callous buildup around the edge of the wound which is going to need sharp  debridement today. She still is waiting to be scheduled for evaluation with [facility] for possibility of an AFO. She states there supposed to call her tomorrow to get this set up. Unfortunately it does appear that her foot specifically the toe area is showing signs of erythema. There does not appear to be any systemic infection which is in these good news. LATEEFAH, MALLERY [name] ([id number]) [id number] Page 13 of 18 10/02/18 on evaluation today patient actually appears to be doing about the same in regard to her toe ulcer. This really has not done too well although it's not significantly larger it's also not significantly smaller. She has been tolerating the dressing changes without complication. She actually has her appointment with [facility] and [facility] tomorrow to hopefully be measured for obtaining and AFO splint. I think this would be helpful preventing this from reoccurring. We had contemplated starting the cast this week although to be honest I am reluctant to do that as she's been having nausea, vomiting, and seizure activity over the past three days. She has [name] history of seizures and have been told is nothing that can be done for these. With that being said I do believe that along with the seizures have the nausea vomiting which upon further questioning doesn't seem to be the normal for her and makes me concerned for the possibility of infection or something else going on. I discussed this with the patient and her mother during the office visit today. I do not feel the wound is effective but maybe something else. The responses this was "this just happens to her at times and we don't know why". They did not seem to be interested in going to the hospital to have this checked out further. 10/09/18 on evaluation today patient presents for follow-up concerning her ongoing toe ulcer. She has been tolerating the dressing changes without complication. Fortunately there does not  appear to be any evidence of infection which is great news however I do think that the patient would benefit from going ahead for with the total contact cast. She's actually in [name] wheelchair today she tells me that she will use her walker if we initiate the cast. I was very specific about the fact that if we were gonna do the cast I wanted to make sure that she was using the walker in order to prevent any falls. She tells me she does not have stairs that she has to traverse on [name] regular basis at her home. She has not had any seizures since last week again that something that happens to her often she tells me she did talk to [employer] and they said that it may take up to three weeks to get the brace approved for her. Hopefully that will not take that long but nonetheless in the meantime I do think the cast could be of benefit. 10/12/18 on evaluation today patient appears to be doing rather well in regard to her toe ulcer. It's just been [name] few days and already this is significantly improved both as far as overall appearance and size. Fortunately there's no sign of infection. She is here for her first obligatory cast change. 10/19/18 Seen today for follow up and management of left great toe ulcer. Wound continues to show improvement. Noted small open area with seroussang drainage with palpation. Denies any increased pain  or recent fevers during visit. She will continue calcium alginate with offloading shoe. Denies any questions or concerns during visit. 10/26/18 on evaluation today patient appears to be doing about the same as when I last saw her in regard to her wound bed. Fortunately there does not appear to be any signs of infection. Unfortunately she continues to have [name] breakdown in regard to the toe region any time that she is not in the cast. It takes almost no time at all for this to happen. Nonetheless she still has not heard anything from the brace being made by [employer] as to when exactly this will be  available to her. Fortunately there is no signs of infection at this time. 10/30/18 on evaluation today patient presents for application of the total contact cast as we just received him this morning. Fortunately we are gonna be able to apply this to her today which is great news. She continues to have no significant pain which is good news. Overall I do feel like things have been improving while she was the cast is when she doesn't have [name] cast that things get worse. She still has not really heard anything from [place] regarding her brace. 11/02/18 upon evaluation today patient's wound already appears to be doing significantly better which is good news. Fortunately there does not appear to be any signs of infection also good news. Overall I do think the total contact cast as before is helping to heal this area unfortunately it's just not gonna likely keep the area closed and healed without her getting her brace at least. Again the foot drop is [name] significant issue for her. 11/09/18 on evaluation today patient appears to be doing excellent in regard to her toe ulcer which in fact is completely healed. Fortunately we finally got the situation squared away with the paperwork which was needed to proceed with getting her brace approved by Medicaid. I have filled that out unfortunately that information has been sent to the orthopedic office that I worked at 2 1/2 years ago and not tired Current wound care measures. Fortunately she seems to be doing very well at this time. 11/23/18 on evaluation today patient appears to be doing More Poorly Compared to Last Time I Saw Her. At [facility] She Had Completely Healed. Currently she is continuing to have issues with reopening. She states that she just found out that the brace was approved through Medicaid now she just has to go get measured in order to have this fitted for her and then made. Subsequently she does not have an appointment for this yet that is going to  complicate things we obviously cannot put her back in the cast if we do not have everything measured because they're not gonna be able to measure her foot while she is in the cast. 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 Obviously this is unfortunate and does have me somewhat worried as well. 11/30/18 on evaluation today patient's toe ulcer actually appears to be doing fairly well. The good news is she will be getting her brace in the shoes next week on Wednesday. Hopefully we will be able to get this to heal without having to go back in the cast however she may need the cast in order to get the wound completely heal and then go from there. Fortunately there's no signs of infection at this  time. 12/07/18 on evaluation today patient fortunately did receive her brace and she states she could tell this definitely makes her walk better. With that being said she's been having issues with her toe where she noticed yesterday there was [name] lot of tissue that was loosing off this appears to be much larger than what it was previous. She also states that her leg has been read putting much across the top of her foot just about the ankle although this seems to be receiving somewhat. The total area is still red and appears to be someone infected as best I can tell. She is previously taken Bactrim and that may be [name] good option for her today as well. We are gonna see what I wound culture shows as well and I think that this is definitely appropriate. With that being said outside of the culture I still need to initiate something in the interim and that's what I'm gonna go ahead and select Bactrim is [name] good option for her. 12/14/18 on evaluation today patient appears to be doing better in regard to her left great toe ulcer as compared to last week's evaluation. There's still some erythema although this is significantly  improved which is excellent news. Overall I do believe that she is making good progress is still gonna take some time before she is where I would like her to be from the standpoint of being able to place her back into the total contact cast. Hopefully we will be where we need to be by next week. 12/21/18 on evaluation today patient actually appears to be doing poorly in regard to her toe ulcer. She's been tolerating the dressing changes without complication. Fortunately there's no signs of systemic infection although she does have [name] lot of drainage from the toe ulcer and this does seem to be causing some issues at this point. She does have erythema on the distal portion of her toe that appears to be likely cellulitis. 12/28/18 on evaluation today patient actually appears to be doing [name] little better in my pinion in regard to her toe ulcer. With that being said she still does have some evidence of infection at this time and for her culture she had both E. coli as well as enterococcus as organisms noted on evaluation. For that reason I think that though the Keflex likely has treated the E. coli rather well this has really done nothing for the enterococcus. We are going to have to initiate treatment for this specifically. 01/04/19 on evaluation today patient's toe actually appears to be doing better from the standpoint of infection. She currently would like to see about putting the cash back on I think that this is appropriate as long as she takes care of it and keeps it from getting wet. She is gonna have some drainage we can definitely pass this up with Drawtex and alginate to try to prevent as much drainage as possible from causing the problems. With that being said I do want to at least try her with the cast between now and Tuesday. If there any issues we can't continue to use it then I will discontinue the use of the cast at that point. 01/08/19 on evaluation today patient actually appears to be doing very  well as far as her foot ulcer specifically the great toe on the left is concerned. She did have an area of rubbing on the medial aspect of her left ankle which again is from the cast. Fortunately there's no signs  of infection at this point in this appears to be [name] very slight skin breakdown. The patient tells me she felt it rubbing but didn't think it was that bad. Fortunately there is no signs of active infection at this time which is good news. No fevers, chills, nausea, or vomiting noted at this time. 01/15/19 on evaluation today patient actually appears to be doing well in regard to her toe ulcer. Again as previous she seems to do well and she has the cast on which indicates to me that during the time she doesn't have [name] cast on she's putting way too much pressure on this region. Obviously I think that's gonna be an issue as with the current national emergency concerning the Covid-19 Virus it has been recommended that we discontinue the use of total contact casting by the chief medical officer of our company, Dr. Simona Huh. The reasoning is that if [name] patient becomes sick and cannot come into have the cast removed they could not just leave this on for an additional two weeks. Obviously the hospitals also do not want to receive patient's who are sick into the emergency department to potentially contaminate the region and spread the Covid-19 Virus among other sick individuals within the hospital system. Therefore at this point we are SHARYN, BRILLIANT [name] [id number] Page 14 of 18 suspending the use of total contact cast until the current emergency subsides. This was all discussed with the patient today as well. 01/22/19 on evaluation today patient's wound on her left great toe appears to be doing slightly worse than previously noted last week. She tells me that she has been on this quite [name] bit in fact she tells me she's been awake for 38 straight hours. This is due to the  fact that she's having to care for grandparents because nobody else will. She has been taking care of them for five the last seven days since I've seen her they both have dementia his is from [name] stroke and her grandmother's was progressive. Nonetheless she states even her mom who knows her condition and situation has only help two of those days to take care of them she's been taking care of the rest. Fortunately there does not appear to be any signs of active infection in regard to her toe at this point although obviously it doesn't look as good as it did previous. I think this is directly related to her not taking off the pressure and friction by way of taking things easy. Though I completely understand what's going on. 01/29/19 on evaluation today patient's tools are actually appears to be showing some signs of improvement today compared to last week's evaluation as far as not necessarily the overall size of the wound but the fact that she has some new skin growth in between the two ends of the wound opening. Overall I feel like she has done well she states that she had [name] family member give her what sounds to be [name] CAM walker boot which has been helpful as well. 02/05/19 on evaluation today patient's wound bed actually appears to be doing significantly better in regard to her overall appearance of the size of the wound. With that being said she is still having an issue with offloading efficiently enough to get this to close. Apparently there is some signs of infection at this point as well unfortunately. Previously she's done well of Augmentin I really do not see anything that needs to be culture currently but there theme and  cellulitis of the foot that I'm seeing I'm gonna go ahead and place her on an antibiotic today to try to help clear this up. 02/12/2019 on evaluation today patient actually appears to be doing poorly in regard to her overall wound status. She tells me she has been using her  offloading shoe but actually comes in today wearing her tennis shoe with the AFO brace. Again as I previously discussed with her this is really not sufficient to allow the area to heal appropriately. Nonetheless she continues to be somewhat noncompliant and I do wonder based on what she has told my nurse in the past as to whether or not [name] good portion of this noncompliance may be recreational drug and alcohol related. She has had [name] history of heroin overdose and this was fairly recently in the past couple of months that have been seeing her. Nonetheless overall I feel like her wound looks significantly worse today compared to what it was previous. She still has significant erythema despite the Augmentin I am not sure that this is an appropriate medication for her infection I am also concerned that the infection may have gone down into her bone. 02/19/19 on evaluation today patient actually appears to be doing about the same in regard to her toe ulcer. Unfortunately she continues to show signs of bone exposure and infection at this point. There does not appear to be any evidence of worsening of the infection but I'm also not really sure that it's getting significantly better. She is on the Augmentin which should be sufficient for the Staphylococcus aureus infection that she has at this point. With that being said she may need IV antibiotics to more appropriately treat this. We did have [name] discussion today about hyperbaric option therapy. 02/28/19 on evaluation today patient actually appears to be doing much worse in regard to the wound on her left great toe as compared to even my previous evaluation last week. Unfortunately this seems to be training in [name] pretty poor direction. Her toe was actually now starting to angle laterally and I can actually see the entire joint area of the proximal portion of the digit where is the distal portion of the digit again is no longer even in contact with the joint  line. Unfortunately there's [name] lot more necrotic tissue around the edge and the toe appears to be showing signs of becoming gangrenous in my pinion. I'm very concerned about were things stand at this point. She did see infectious disease and they are planning to send in [name] prescription for Sivextro for her and apparently this has been approved. With that being said I don't think she should avoid taking this but at the same time I'm not sure that it's gonna be sufficient to save her toe at this point. She tells me that she still having to care for grandparents which I think is putting quite [name] bit of strain on her foot and specifically the total area and has caused this to break down even to [name] greater degree than would've otherwise been expected. 03/05/19 on evaluation today patient actually appears to be doing quite well in regard to her toe all things considering. She still has bone exposed but there appears to be much less your thing on overall the appearance of the wound and the toe itself is dramatically improved. She still does have some issues currently obviously with infection she did see vascular as well and there concerned that her blood flow to the toad.  For that reason they are setting up for an angiogram next week. 03/14/19 on evaluation today patient appears to be doing very poor in regard to her toe and specifically in regard to the ulceration and the fact that she's starting to notice the toe was leaning even more towards the lateral aspect and the complete joint is visible on the proximal aspect of the joint. Nonetheless she's also noted [name] significant odor and the tip of the toe is turning more dark and necrotic appearing. Overall I think she is getting worse not better as far as this is concerned. For that reason I am recommending at this point that she likely needs to be seen for likely amputation. READMISSION 03/19/2021 This is [name] patient that we cared for in this clinic for [name] prolonged  period of time in 2019 and 2020 with [name] left foot and left first toe wound. 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 Luana Shu. In December 2021 she had an ulcer on her right great toe as well as the fourth and fifth toes. 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 She also had an angiogram at that time and underwent angioplasty of the right anterior tibial artery. In any case she claims that the wound on the right foot is closed I did not look at this today which was probably an oversight although I think that should be done next week. 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 According to Dr. Deborra Medina last review that she was out of the area being cared for by another physician but recently came back to his attention. 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 [name] culture of this area showed E. coli apparently before she came back to see Dr. Luana Shu she was supposed to be receiving antibiotics but she did not really take them. Nor is she offloading this area at all. Finally her last hemoglobin A1c listed in epic was in March 2022 at 14.1 she says things are [name] lot better since then although I am not sure. 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 She had this wound at the time. During this admission she had E. coli sepsis [name] TEE was negative. 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 She is still [name] heavy cigarette smoker. She has [name] history of bipolar disorder  seizure disorder ABI in our clinic was 1.05 6/1; left midfoot in the setting of [name] TMA done previously. Round circular wound with [name] "knuckle" of protruding tissue. The problem is that the knuckle was not attached to any of the surrounding granulation and this probed proximally widely I removed [name] large portion of this tissue. This wound goes with considerable undermining laterally. I do not feel any bone there was no purulence but this is [name] deep wound. 6/8; in spite of the debridement I did last week. She arrives with [name] wound looking exactly the same. [name] protruding "knuckle" of tissue nonadherent to most of the surrounding tissue. There is considerable depth around this from 6-12 o'clock  at 2.7 cm and undermining of 1 cm. This does not look overtly infected and the x- ray I did last week was negative for any osseous abnormalities. We have been using silver collagen 6/15; deep tissue culture I did last week showed moderate staph aureus and moderate Pseudomonas. This will definitely require prolonged antibiotic therapy. The pathology on the protuberant area was negative for malignancy fungus etc. 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 We have been using silver collagen. I am going to be prescribing Levaquin for 2 weeks. Her CT scan of the foot is down for 7/5 6/22; CT scan of the foot on 7 5. She says she has hardware in the left leg from her previous fracture. She is on the Levaquin for the deep tissue culture I did that showed methicillin sensitive staph aureus and Pseudomonas. I gave her [name] 2-week supply and she will have another week. She arrives in clinic today with the same protuberant tissue however this is nonadherent to the tissue surrounding it. I am really at [name] loss to explain this unless there is underlying deep tissue infection 6/29; patient presents for 1 week follow-up. She has been using collagen to the wound bed. She reports  taking her antibiotics as prescribed.She has no complaints or issues today. She denies signs of infection. 7/6; patient presents for one week followup. She has been using collagen to the wound bed. She states she is taking Levaquin however at times she is not able to keep it down. She denies signs of infection. 7/13; patient presents for 1 week follow-up. She has been using silver alginate to the wound bed. She still has nausea when taking her antibiotics. She denies MEEKAH, MATH [name] ([id number]) [id number] Page 15 of 18 signs of infection. 7/20; patient presents for 1 week follow-up. She has been using silver alginate with gentamicin cream to the wound bed. She denies any issues and has no complaints today. She denies signs of infection. 7/27; patient presents for 1 week follow-up. She continues to use silver alginate with gentamicin cream to the wound bed. She reports starting her antibiotics. She has no issues or complaints. Overall she reports stability to the wound. 8/3; patient presents for 1 week follow-up. She has been using silver alginate with gentamicin cream to the wound bed. She reports completing all antibiotics. She has no issues or complaints today. She denies signs of infection. 8/17; patient presents for 2-week follow-up. He is to use silver alginate to the wound bed. She has no issues or complaints today. She denies signs of infection. She reports her pain has improved in her foot since last clinic visit 8/24; patient presents for 1 week follow-up. She continues to use silver alginate to the wound bed. She has no issues or complaints. She denies signs of infection. Pain is stable. 9/7; patient presents for follow-up. She missed her last week appointment due to feeling ill. She continues to use silver alginate. She has [name] new wound to the right lower extremity that is covered in eschar. She states It occurred over the past week and has no idea how it  started. She currently denies signs of infection. 9/14; patient presents for follow-up. T the left foot wound she has been using gentamicin cream and silver alginate. T the right lower extremity wound she has o o been keeping this covered and has not obtain Santyl. 9/21; patient presents for follow-up. She reports using gentamicin cream and silver alginate to  the left foot and Santyl to the right lower extremity wound. She has no issues or complaints today. She denies signs of infection. 9/28; patient presents for follow-up. She reports [name] new wound to her right heel. She states this occurred [name] few days ago and is progressively gotten worse. She has been trying to clean the area with [name] Q-tip and Santyl. She reports stability in the other 2 wounds. She has been using gentamicin cream and silver alginate to the left foot and Santyl to the right lower extremity wound. 10/12; patient presents for follow-up. She reports improvement to the wound beds. She is seeing vein and vascular to discuss the potential of [name] left BKA. She states they are going to do an arteriogram. She continues to use silver alginate with dressing changes to her wounds. 11/2; patient presents for follow-up. She states she has not been doing dressing changes to the wound beds. She states she is not able to offload the areas. She reports chronic pain to her left foot wound. 11/9; patient presents for follow-up. She came in with only socks on. She states she forgot to put on shoes. It is unclear if she is doing any dressing changes. She currently denies systemic signs of infection. 11/16; patient presents for follow-up. She came again only with socks on. She states she does not wear shoes ever. It is unclear if she does dressing changes. She currently denies systemic signs of infection. 11/23; patient presents for follow-up. She wore her shoes today. It still unclear exactly what dressing she is using for each wound but she did states  she obtained Dakin's solution and has been using this to the left foot wound. She currently denies signs of infection. 11/30; patient presents for follow-up. She has no issues or complaints today. She currently denies signs of infection. 12/7; patient presents for follow-up. She has no issues or complaints today. She has been using Hydrofera Blue to the right heel wound and Dakin solution to the left foot wound. Her right anterior leg wound is healed. She currently denies signs of infection. 12/14; patient presents for follow-up. She has been using Hydrofera Blue to the right heel and Dakin's to the left foot wounds. She has no issues or complaints today. She denies signs of infection. 12/21; patient presents for follow-up. She reports using Hydrofera Blue to the right heel and Dakin's to the left foot wound. She denies signs of infection. 12/28; patient presents for follow-up. She continues to use Dakin's to the left foot wound and Hydrofera Blue to the right heel wound. She denies signs of infection. 1/4; patient presents for follow-up. She has no issues or complaints today. She denies signs of infection. 1/11; patient presents for follow-up. It is unclear if she has been dressing these wounds over the past week. She currently denies signs of infection. 1/18; patient presents for follow-up. She states she has been using Dakin's wet-to-dry dressings to the left foot. She has been using Hydrofera Blue to the right foot foot wound. She states that the anterior right leg wound has reopened and draining serous fluid. She denies signs of infection. 1/25; patient presents for follow-up. She has no issues or complaints today. 2/1; patient presents for follow-up. She has no issues or complaints today. She denies signs of infection. 2/8; patient presents for follow-up. She has lost her surgical shoes. She did not have [name] dressing to the right heel wound. She currently denies signs of infection. 2/15;  patient presents for follow-up. She  reports more pain to the right heel today. She denies purulent drainage Or fever/chills 2/22; patient presents for follow-up. She reports taking clindamycin over the past week. She states that she continues to have pain to her right heel. She reports purulent drainage. Readmission 03/16/2022 Ms. Chiyoko Torrico is [name] 46 year old female with [name] past medical history of type 2 diabetes, osteomyelitis to her feet, chronic systolic heart failure and bipolar disorder that presents to the clinic for bilateral feet wounds and right lower extremity wound. She was last seen in our clinic on 12/15/2021. At that time she had purulent drainage coming out of her right plantar foot and I recommended she go to the ED. She states she went to [facility] and has been there for the past 3 months. I cannot see the records. She states she had OR debridement and was on several weeks of IV antibiotics while inpatient. Since discharge she has not been taking care of the wound beds. She had nothing on her feet other than socks today. She currently denies signs of infection. 5/31; patient presents for follow-up. She has been using Dakin's wet-to-dry dressings to the wound beds on her feet bilaterally and antibiotic ointment to the right anterior leg wound. She had [name] wound culture done at last clinic visit that showed moderate Pseudomonas aeruginosa sensitive to ciprofloxacin. She currently denies systemic signs of infection. 6/14; patient presents for follow-up. She received Keystone 5 days ago and has been using this on the wound beds. She states that last week she had to go to the hospital because she had increased warmth and erythema to the right foot. She was started on 2 oral antibiotics. She states she has been taking these. She currently denies systemic signs of infection. She has no issues or complaints today. 6/21; patient presents for follow-up. She states she has been using  Keystone antibiotics to the wound beds. She has no issues or complaints today. She denies signs of infection. 6/28; patient presents for follow-up. She has been using Keystone antibiotics to the wound beds. She has no issues or complaints today. 7/12; patient presents for follow-up. Has been using Keystone antibiotics to the wound beds with calcium alginate. She has no issues or complaints today. She never followed up with her orthopedic surgeon who did the OR debridement to the right foot. We discussed the total contact cast for the left foot and patient would like to do this next week. MARIAN, MENEELY [name] ([id number]) [id number] Page 16 of 18 7/19; patient presents for follow-up. She has been using Keystone antibiotics with calcium alginate to the wound beds. She has no issues or complaints today. Patient is in agreement to do the total contact cast of the left foot today. She knows to return later this week for the obligatory cast change. 05-13-2022 upon evaluation today patient's wound which she has the cast of the left leg actually appears to be doing significantly better. Fortunately I do not see any signs of active infection locally or systemically which is great news and overall I am extremely pleased with where we stand currently. 7/26; patient presents for follow-up. She has [name] cast in place for the past week. She states it irritated her shin. Other than that she tolerated the cast well. She states she would like [name] break for 1 week from the cast. We have been using Keystone antibiotic and Aquacel to both wound beds. She denies signs of infection. 8/2; patient presents for follow-up. She has been using Keystone  and Aquacel to the wound beds. She denies any issues and has no complaints. She is agreeable to have the cast placed today for the left leg. 06-03-2022 upon evaluation today patient appears to be doing well with regard to her wound she saw some signs of  improvement which is great news. Fortunately I do not see any evidence of active infection locally or systemically at this time which is great news. No fevers, chills, nausea, vomiting, or diarrhea. 8/16; patient presents for follow-up. She has no issues or complaints today. We have been using Keystone and Aquacel to the wound beds. The left lower extremity is in [name] total contact cast. She is tolerated this well. 8/23; patient presents for follow-up. She has had the total contact cast on the left leg for the past week. Unfortunately this has rubbed and broken down the skin to the medial foot. She currently denies signs of infection. She has been using Keystone antibiotic to the right plantar foot wound. 8/30; patient presents for follow-up. We have held off on the total contact cast for the left leg for the past week. Her wound on the left foot has improved and the previous surrounding breakdown of skin has epithelialized. She has been using Keystone antibiotic to both wound beds. She has no issues or complaints today. She denies signs of infection. 9/6; patient presents for follow-up. She has ordered her's Keystone antibiotic and this is arriving this week. She has been doing Dakin's wet-to-dry dressings to the wound beds. She denies signs of infection. She is agreeable to the total contact cast today. 9/13; patient presents for follow-up. She states that the cast caused her left leg shin to be sore. She would like to take [name] break from the cast this week. She has been using Keystone antibiotic to the right plantar foot wound. She denies signs of infection. 9/20; patient presents for follow-up. She has been using Keystone antibiotics to the wound beds with calcium alginate to the right foot wound and Hydrofera Blue to the left foot wound. She is agreeable to having the cast placed today. She has been approved for Apligraf and we will order this for next clinic visit. 9/27; patient presents for  follow-up. We have been using Keystone antibiotic with Hydrofera Blue to the left foot wound under [name] total contact cast. T the right o foot wound she has been using Keystone antibiotic and calcium alginate. She declines [name] total contact cast today. Apligraf is available for placement and she would like to proceed with this. 07-28-2022 upon evaluation today patient appears to be doing well currently in regard to her wound. She is actually showing signs of significant improvement which is great news. Fortunately I do not see any evidence of active infection locally nor systemically at this time. She has been seeing Dr. Heber [place] and to be honest has been doing very well with the cast. Subsequently she comes in today with [name] cast on and we did reapply that today as well. She did not really want to she try to talk me out of that but I explained that if she wanted to heal this is really the right way to go. Patient voiced understanding. In regard to her right foot this is actually [name] lot better compared to the last time I saw her which is also great news. 10/11; patient presents for follow-up. Apligraf and the total contact cast was placed to the left leg at last clinic visit. She states that her right foot wound  had burning pain to it with the placement of Apligraf to this area. She has been doing Camino over this area. She denies signs of infection including increased warmth, erythema or purulent drainage. Objective Constitutional Vitals Time Taken: 9:55 AM, Height: 69 in, Weight: 178 lbs, BMI: 26.3, Temperature: 98.4 F, Pulse: 51 bpm, Respiratory Rate: 18 breaths/min, Blood Pressure: 94/55 mmHg. General Notes: Right foot: T the plantar heel there is an incision site with increased depth. probes close to bone. Left foot: T the medial aspect there is an o o open wound with granulation tissue and nonviable tissue with surrounding callus. Again no signs of infection. Integumentary (Hair, Skin) Wound #11  status is Open. Original cause of wound was Surgical Injury. The date acquired was: 12/01/2021. The wound has been in treatment 20 weeks. The wound is located on the [place]. The wound measures 0.5cm length x 0.3cm width x 1cm depth; 0.118cm^2 area and 0.118cm^3 volume. There is Fat Layer (Subcutaneous Tissue) exposed. There is no tunneling or undermining noted. There is [name] medium amount of serosanguineous drainage noted. The wound margin is distinct with the outline attached to the wound base. There is medium (34-66%) pink, pale granulation within the wound bed. There is [name] medium (34- 66%) amount of necrotic tissue within the wound bed. Wound #12 status is Open. Original cause of wound was Pressure Injury. The date acquired was: 03/16/2020. The wound has been in treatment 20 weeks. The wound is located on the Christopher. The wound measures 1.7cm length x 0.5cm width x 0.1cm depth; 0.668cm^2 area and 0.067cm^3 volume. There is Fat Layer (Subcutaneous Tissue) exposed. There is no tunneling or undermining noted. There is [name] medium amount of serous drainage noted. The wound margin is flat and intact. There is large (67-100%) red, pink granulation within the wound bed. There is no necrotic tissue within the wound bed. Assessment Active Problems ICD-10 ITSEL, OPFER [name] ([id number]) [id number] Page 17 of 18 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ity Non-pressure chronic ulcer of other part of right foot with fat layer exposed Type 2 diabetes mellitus with foot ulcer Type 2 diabetes mellitus with diabetic polyneuropathy Non-pressure chronic ulcer of other part of right lower leg limited to breakdown of skin Patient's left foot wound has shown improvement in size and appearance since last clinic visit. I debrided nonviable tissue. Apligraf was placed in standard fashion today. She would like to take [name] break from the  cast. She is having [name] slight area of skin breakdown to the heel and think it would be reasonable to take [name] break however she knows the risk of her wound declining due to not offloading the wound bed. The right foot wound is slightly deeper. We are nearing close to bone. No signs of infection on exam. I recommended continuing with [facility] antibiotic and calcium alginate to this area. Follow-up in 1 week.. Procedures Wound #12 Pre-procedure diagnosis of Wound #12 is [name] Diabetic Wound/Ulcer of the Lower Extremity located on the Left,Medial,Plantar Foot .Severity of Tissue Pre Debridement is: Fat layer exposed. There was [name] Excisional Skin/Subcutaneous Tissue Debridement with [name] total area of 1.7 sq cm performed by Kalman Shan, MD. With the following instrument(s): Curette to remove Viable and Non-Viable tissue/material. Material removed includes Callus, Subcutaneous Tissue, and Slough. [name] time out was conducted at 10:15, prior to the start of the procedure. [name] Minimum amount of bleeding was controlled with Pressure. The procedure was tolerated well. Post Debridement Measurements:  1.7cm length x 0.5cm width x 0.2cm depth; 0.134cm^3 volume. Character of Wound/Ulcer Post Debridement is stable. Severity of Tissue Post Debridement is: Fat layer exposed. Post procedure Diagnosis Wound #12: Same as Pre-Procedure Wound #11 Pre-procedure diagnosis of Wound #11 is an Open Surgical Wound located on the Right,Plantar Foot. [name] skin graft procedure using [name] bioengineered skin substitute/cellular or tissue based product was performed by Kalman Shan, MD with the following instrument(s): Blade, Forceps, and Scissors. Apligraf was applied and secured with Steri-Strips. 11 sq cm of product was utilized and 33 sq cm was wasted due to wound size. Post Application, bolus was applied. [name] Time Out was conducted at 10:27, prior to the start of the procedure. The procedure was tolerated well. Post procedure Diagnosis Wound  #11: Same as Pre-Procedure . Plan Follow-up Appointments: Return Appointment in 1 week. Nurse Visit as needed Bathing/ Shower/ Hygiene: May shower with wound dressing protected with water repellent cover or cast protector. No tub bath. Anesthetic (Use 'Patient Medications' Section for Anesthetic Order Entry): Lidocaine applied to wound bed Cellular or Tissue Based Products: Cellular or Tissue Based Product Type: - apligraf #3 applied to left foot Edema Control - Lymphedema / Segmental Compressive Device / Other: Elevate, Exercise Daily and Avoid Standing for Long Periods of Time. Elevate legs to the level of the heart and pump ankles as often as possible Elevate leg(s) parallel to the floor when sitting. Off-Loading: T Contact Cast to Left Lower Extremity - hold cast for this week contact cast #8 applied Don't get cast wet otal Other: - keep pressure off of feet. Medications-Please add to medication list.: [employer] - Keystone gel right foot WOUND #11: - Foot Wound Laterality: Plantar, Right Cleanser: Soap and Water Every Other Day/30 Days Discharge Instructions: Gently cleanse wound with antibacterial soap, rinse and pat dry prior to dressing wounds Topical: keystone gel Every Other Day/30 Days Prim Dressing: Aquacel Extra Hydrofiber Dressing, 2x2 (in/in) Every Other Day/30 Days ary Secondary Dressing: ABD Pad 5x9 (in/in) (Generic) Every Other Day/30 Days Discharge Instructions: Cover with ABD pad Secured With: Medipore T - 30M Medipore H Soft Cloth Surgical T ape ape, 2x2 (in/yd) (Generic) Every Other Day/30 Days Secured With: Kerlix Roll Sterile or Non-Sterile 6-ply 4.5x4 (yd/yd) (Generic) Every Other Day/30 Days Discharge Instructions: Apply Kerlix as directed Add-Ons: total contact cast Every Other Day/30 Days WOUND #12: - Foot Wound Laterality: Plantar, Left, Medial Cleanser: Soap and Water 1 x Per Week/30 Days Discharge Instructions: Gently cleanse wound with  antibacterial soap, rinse and pat dry prior to dressing wounds Topical: Vitamin AandD Ointment, 4(oz) tube 1 x Per Week/30 Days Discharge Instructions: apply to red, broken down area Prim Dressing: Apligraf 1 x Per Week/30 Days ary Discharge Instructions: #2 applied today Secondary Dressing: ABD Pad 5x9 (in/in) (Generic) 1 x Per Week/30 Days Discharge Instructions: Cover with ABD pad Secondary Dressing: Gauze 1 x Per Week/30 Days Discharge Instructions: As directed: dry, moistened with saline or moistened with Dakins Solution Secured With: Medipore T - 30M Medipore H Soft Cloth Surgical T ape ape, 2x2 (in/yd) (Generic) 1 x Per Week/30 Days Secured With: [employer] Sterile or Non-Sterile 6-ply 4.5x4 (yd/yd) (Generic) 1 x Per Week/30 Days Discharge Instructions: Apply Kerlix as directed Add-Ons: total contact cast 1 x Per Week/30 Days TY, OSHIMA [name] ([id number]) [id number] Page 18 of 18 1. In office sharp debridement 2. Apligraf placed in standard fashion - left foot 3. Aggressive offloading 4. Keystone antibiotic and calcium alginate to the right foot  5. Follow up in one week Electronic Signature(s) Signed: 08/03/2022 3:09:38 PM By: Kalman Shan DO Entered By: Kalman Shan on 08/03/2022 11:15:24 -------------------------------------------------------------------------------- SuperBill Details Patient Name: Date of Service: Heather Hitch [name]. 08/03/2022 Medical Record Number: [id number] Patient Account Number: [id number] Date of Birth/Sex: Treating RN: Mar 26, 1976 (46 y.o. 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 Facility Procedures : CPT4 Code: [id number] Description: [phone] (Facility Use Only) Apligraf 44 SQ CM Modifier: Quantity: 44 : CPT4 Code: [id number] Description: 66599 - SKIN SUB GRAFT FACE/NK/HF/G ICD-10 Diagnosis Description 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i E11.621 Type 2 diabetes mellitus with foot ulcer Modifier: ty Quantity: 1 Physician Procedures : CPT4 Code Description Modifier [id number] - WC PHYS SKIN SUB GRAFT FACE/NK/HF/G ICD-10 Diagnosis Description 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ity E11.621 Type 2 diabetes mellitus with foot ulcer Quantity: 1 Electronic Signature(s) Signed: 08/03/2022 3:09:38 PM By: Kalman Shan DO Entered By: Kalman Shan on 08/03/2022 11:16:02

## 2022-08-17 ENCOUNTER — Encounter: Payer: Medicaid Other | Admitting: Internal Medicine

## 2022-08-22 ENCOUNTER — Encounter (INDEPENDENT_AMBULATORY_CARE_PROVIDER_SITE_OTHER): Payer: Self-pay

## 2022-08-24 ENCOUNTER — Encounter: Payer: Medicaid Other | Attending: Internal Medicine | Admitting: Internal Medicine

## 2022-08-24 DIAGNOSIS — I5022 Chronic systolic (congestive) heart failure: Secondary | ICD-10-CM | POA: Insufficient documentation

## 2022-08-24 DIAGNOSIS — I1310 Hypertensive heart and chronic kidney disease without heart failure, with stage 1 through stage 4 chronic kidney disease, or unspecified chronic kidney disease: Secondary | ICD-10-CM | POA: Diagnosis not present

## 2022-08-24 DIAGNOSIS — L97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 of skin: Secondary | ICD-10-CM | POA: Diagnosis not present

## 2022-08-24 DIAGNOSIS — L97512 Non-pressure chronic ulcer of other part of right foot with fat layer exposed: Secondary | ICD-10-CM | POA: Insufficient documentation

## 2022-08-24 DIAGNOSIS — N183 Chronic kidney disease, stage 3 unspecified: Secondary | ICD-10-CM | POA: Insufficient documentation

## 2022-08-24 DIAGNOSIS — Z9181 History of falling: Secondary | ICD-10-CM | POA: Insufficient documentation

## 2022-08-24 DIAGNOSIS — L97528 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ity: Secondary | ICD-10-CM | POA: Insufficient documentation

## 2022-08-24 DIAGNOSIS — E1142 Type 2 diabetes mellitus with diabetic polyneuropathy: Secondary | ICD-10-CM | POA: Insufficient documentation

## 2022-08-24 DIAGNOSIS — E11621 Type 2 diabetes mellitus with foot ulcer: Secondary | ICD-10-CM | POA: Diagnosis present

## 2022-08-24 DIAGNOSIS — E1122 Type 2 diabetes mellitus with diabetic chronic kidney disease: Secondary | ICD-10-CM | POA: Insufficient documentation

## 2022-08-24 DIAGNOSIS — F03911 Unspecified dementia, unspecified severity, with agitation: Secondary | ICD-10-CM | POA: Insufficient documentation

## 2022-08-24 NOTE — Progress Notes (Signed)
[name] ([id number]) [id number] Page 1 of 11 Visit Report for 08/24/2022 Arrival Information Details Patient Name: Date of Service: [name], [name] 08/24/2022 10:30 [name]: [id number] Patient Account Number: [id number] Date of Birth/Sex: Treating RN: 1975/12/18 (46 y.o. [name] Primary Care Natalynn Pedone: Raelene Bott Other Clinician: Massie Kluver Referring Nickholas Goldston: Treating Mkenzie Dotts/Extender: RO BSO N, MICHA EL Hermelinda Dellen, [name] Weeks in Treatment: 23 Visit Information History Since Last Visit All ordered tests and consults were completed: No Patient Arrived: Wheel Chair Added or deleted any medications: No Arrival Time: 11:08 Any new allergies or adverse reactions: No Transfer Assistance: EasyPivot Patient Lift Had [name] fall or experienced change in No Patient Requires Transmission-Based Precautions: No activities of daily living that may affect Patient Has Alerts: No risk of falls: Hospitalized since last visit: No Pain Present Now: Yes Electronic Signature(s) Signed: 08/24/2022 4:40:40 PM By: Massie Kluver Entered By: Massie Kluver on 08/24/2022 11:08:57 -------------------------------------------------------------------------------- Clinic Level of Care Assessment Details Patient Name: Date of Service: [name] 08/24/2022 10:30 [name]: [id number] Patient Account Number: [id number] Date of Birth/Sex: Treating RN: 1976-02-11 (46 y.o. [name] Primary Care Cortny Bambach: Raelene Bott Other Clinician: Massie Kluver Referring Aala Ransom: Treating Kaeden Mester/Extender: RO BSO N, [place], [name] Weeks in Treatment: 23 Clinic Level of Care Assessment Items TOOL 1 Quantity Score _0  - 0 Use when EandM and Procedure is performed on INITIAL visit ASSESSMENTS - Nursing Assessment / Reassessment _1  - 0 General Physical Exam (combine w/ comprehensive assessment (listed just below) when  performed on new pt. evals) _2  - 0 Comprehensive Assessment (HX, ROS, Risk Assessments, Wounds Hx, etc.) ASSESSMENTS - Wound and Skin Assessment / Reassessment _3  - 0 Dermatologic / Skin Assessment (not related to wound area) ASSESSMENTS - Ostomy and/or Continence Assessment and Care ILLEANA, EDICK [name] ([id number]) [id number] Page 2 of 11 _4  - 0 Incontinence Assessment and Management _5  - 0 Ostomy Care Assessment and Management (repouching, etc.) PROCESS - Coordination of Care _6  - 0 Simple Patient / Family Education for ongoing care _7  - 0 Complex (extensive) Patient / Family Education for ongoing care _8  - 0 Staff obtains Programmer, systems, Records, T Results / Process Orders est _9  - 0 Staff telephones HHA, Nursing Homes / Clarify orders / etc _10  - 0 Routine Transfer to another Facility (non-emergent condition) _11  - 0 Routine Hospital Admission (non-emergent condition) _12  - 0 New Admissions / Biomedical engineer / Ordering NPWT Apligraf, etc. , _13  - 0 Emergency Hospital Admission (emergent condition) PROCESS - Special Needs _14  - 0 Pediatric / Minor Patient Management _15  - 0 Isolation Patient Management _16  - 0 Hearing / Language / Visual special needs _17  - 0 Assessment of Community assistance (transportation, D/C planning, etc.) _18  - 0 Additional assistance / Altered mentation _19  - 0 Support Surface(s) Assessment (bed, cushion, seat, etc.) INTERVENTIONS - Miscellaneous _20  - 0 External ear exam _21  - 0 Patient Transfer (multiple staff / Civil Service fast streamer / Similar devices) _22  - 0 Simple Staple / Suture removal (25 or less) _23  - 0 Complex Staple / Suture removal (26 or more) _24  - 0 Hypo/Hyperglycemic Management (do not check if billed separately) _25  - 0 Ankle / Brachial Index (ABI) - do not check if billed separately Has the patient been seen at the hospital within the last three years: Yes Total Score: 0 Level Of Care: ____ Electronic  Signature(s) Signed: 08/24/2022 4:40:40 PM By: Massie Kluver Entered By: Massie Kluver on  08/24/2022 11:53:26 -------------------------------------------------------------------------------- Encounter Discharge Information Details Patient Name: Date of Service: [name] 08/24/2022 10:30 [name]: [id number] Patient Account Number: [id number] Date of Birth/Sex: Treating RN: 1976/09/04 (46 y.o. [name] Primary Care Daltin Crist: Raelene Bott Other Clinician: Massie Kluver Referring Kaleth Koy: Treating [place] Ndiaye/Extender: RO BSO N, [place], [name] Weeks in Treatment: 23 Encounter Discharge Information Items Post Procedure Vitals Discharge Condition: Stable Temperature (F): 98.6 Barbato, [name] ([id number]) [id number] Page 3 of 11 Ambulatory Status: Wheelchair Pulse (bpm): 67 Discharge Destination: Home Respiratory Rate (breaths/min): 18 Transportation: Private Auto Blood Pressure (mmHg): 148/98 Accompanied By: mother Schedule Follow-up Appointment: Yes Clinical Summary of Care: Electronic Signature(s) Signed: 08/24/2022 4:40:40 PM By: Massie Kluver Entered By: Massie Kluver on 08/24/2022 16:23:07 -------------------------------------------------------------------------------- Lower Extremity Assessment Details Patient Name: Date of Service: MADDELYN, ROCCA 08/24/2022 10:30 [name]: [id number] Patient Account Number: [id number] Date of Birth/Sex: Treating RN: 22-Feb-1976 (46 y.o. [name] Primary Care Machele Deihl: Raelene Bott Other Clinician: Massie Kluver Referring Manual Navarra: Treating Jequan Shahin/Extender: RO BSO Delane Ginger, [place] Hoffman, [name] Weeks in Treatment: 23 Electronic Signature(s) Signed: 08/24/2022 4:40:40 PM By: Massie Kluver Signed: 08/24/2022 5:26:54 PM By: Gretta Cool, BSN, RN, CWS, [name] RN, BSN Entered By: Massie Kluver on 08/24/2022  11:20:56 -------------------------------------------------------------------------------- Multi Wound Chart Details Patient Name: Date of Service: [name] 08/24/2022 10:30 [name]: [id number] Patient Account Number: [id number] Date of Birth/Sex: Treating RN: 1976/08/12 (46 y.o. [name] Primary Care Adalyna Godbee: Raelene Bott Other Clinician: Massie Kluver Referring Catilyn Boggus: Treating Peggyann Zwiefelhofer/Extender: RO BSO N, MICHA EL Hermelinda Dellen, [name] Weeks in Treatment: 23 Vital Signs Height(in): 69 Pulse(bpm): 67 Weight(lbs): 178 Blood Pressure(mmHg): 148/98 Body Mass Index(BMI): 26.3 Temperature(F): 98.1 Respiratory Rate(breaths/min): 16 [11:Photos:] [N/[name]:N/[name] [id number] Page 4 of 11] Right, Plantar Foot Left, Medial, Plantar Foot N/[name] Wound Location: Surgical Injury Pressure Injury N/[name] Wounding Event: Open Surgical Wound 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: Extremity Chronic sinus problems/congestion, Chronic sinus problems/congestion, N/[name] Comorbid History: Middle ear problems, Anemia, Chronic Middle ear problems, Anemia, Chronic Obstructive Pulmonary Disease Obstructive Pulmonary Disease (COPD), Congestive Heart Failure, (COPD), Congestive Heart Failure, Type II Diabetes, End Stage Renal Type II Diabetes, End Stage Renal Disease, History of pressure wounds, Disease, History of pressure wounds, Neuropathy Neuropathy 12/01/2021 03/16/2020 N/[name] Date Acquired: 23 23 N/[name] Weeks of Treatment: Open Open N/[name] Wound Status: No No N/[name] Wound Recurrence: 0.6x0.6x1.1 2x0.9x0.2 N/[name] Measurements L x W x D (cm) 0.283 1.414 N/[name] [name] (cm) : rea 0.311 0.283 N/[name] Volume (cm) : 84.70% 70.00% N/[name] % Reduction in Area: 81.30% 70.00% N/[name] % Reduction in Volume: Full Thickness Without Exposed Grade 3 N/[name] Classification: Support Structures Medium Medium N/[name] Exudate Amount: Serosanguineous Serous N/[name] Exudate Type: red,  brown amber N/[name] Exudate Color: Distinct, outline attached Flat and Intact N/[name] Wound Margin: Medium (34-66%) Large (67-100%) N/[name] Granulation Amount: Pink, Pale Red, Pink N/[name] Granulation Quality: Medium (34-66%) None Present (0%) N/[name] Necrotic Amount: Fat Layer (Subcutaneous Tissue): Yes Fat Layer (Subcutaneous Tissue): Yes N/[name] Exposed Structures: Fascia: No Fascia: No Tendon: No Tendon: No Muscle: No Muscle: No Joint: No Joint: No Bone: No Bone: No Medium (34-66%) Small (1-33%) N/[name] Epithelialization: Treatment Notes Electronic Signature(s) Signed: 08/24/2022 4:40:40 PM By: Massie Kluver Entered By: Massie Kluver on 08/24/2022 11:32:09 -------------------------------------------------------------------------------- Multi-Disciplinary Care Plan Details Patient Name: Date of Service: [name] 08/24/2022 10:30 [name]: [id number] Patient Account Number: [id number] Date of  Birth/Sex: Treating RN: 1975-12-22 (46 y.o. [name] Primary Care Falen Lehrmann: Raelene Bott Other Clinician: Massie Kluver Referring Maureen Duesing: Treating Jaking Thayer/Extender: RO BSO N, [place] EL Hermelinda Dellen, Maeola Harman in Treatment: 23 Active Inactive Abuse / Safety / Falls / Self Care Management Nursing Diagnoses: NYAISHA, SIMAO [name] ([id number]) [id number] Page 5 of 11 History of Falls Potential for falls Potential for injury related to falls Goals: Patient will not develop complications from immobility Date Initiated: 03/17/2022 Date Inactivated: 04/20/2022 Target Resolution Date: 03/16/2022 Goal Status: Met Patient/caregiver will verbalize understanding of skin care regimen Date Initiated: 03/17/2022 Target Resolution Date: 03/16/2022 Goal Status: Active Patient/caregiver will verbalize/demonstrate measure taken to improve self care Date Initiated: 03/17/2022 Target Resolution Date: 03/16/2022 Goal Status: Active Interventions: Assess fall risk on  admission and as needed Provide education on basic hygiene Provide education on personal and home safety Notes: Necrotic Tissue Nursing Diagnoses: Impaired tissue integrity related to necrotic/devitalized tissue Knowledge deficit related to management of necrotic/devitalized tissue Goals: Necrotic/devitalized tissue will be minimized in the wound bed Date Initiated: 03/17/2022 Date Inactivated: 05/04/2022 Target Resolution Date: 03/16/2022 Goal Status: Met Patient/caregiver will verbalize understanding of reason and process for debridement of necrotic tissue Date Initiated: 03/17/2022 Target Resolution Date: 03/16/2022 Goal Status: Active Interventions: Provide education on necrotic tissue and debridement process Treatment Activities: Apply topical anesthetic as ordered : 03/16/2022 Excisional debridement : 03/16/2022 Notes: Osteomyelitis Nursing Diagnoses: Infection: osteomyelitis Knowledge deficit related to disease process and management Goals: Patient/caregiver will verbalize understanding of disease process and disease management Date Initiated: 03/17/2022 Date Inactivated: 04/20/2022 Target Resolution Date: 03/16/2022 Goal Status: Met Patient's osteomyelitis will resolve Date Initiated: 03/17/2022 Target Resolution Date: 03/16/2022 Goal Status: Active Signs and symptoms for osteomyelitis will be recognized and promptly addressed Date Initiated: 03/17/2022 Target Resolution Date: 03/16/2022 Goal Status: Active Interventions: Assess for signs and symptoms of osteomyelitis resolution every visit Provide education on osteomyelitis Treatment Activities: Systemic antibiotics : 03/16/2022 Notes: Wound/Skin Impairment Nursing Diagnoses: Impaired tissue integrity Knowledge deficit related to smoking impact on wound healing Knowledge deficit related to ulceration/compromised skin integrity Chern, Shelsey [name] ([id number]) [id number] Page 6 of  11 Goals: Patient/caregiver will verbalize understanding of skin care regimen Date Initiated: 03/17/2022 Date Inactivated: 04/20/2022 Target Resolution Date: 03/16/2022 Goal Status: Met Ulcer/skin breakdown will have [name] volume reduction of 30% by week 4 Date Initiated: 03/17/2022 Target Resolution Date: 04/13/2022 Goal Status: Active Ulcer/skin breakdown will have [name] volume reduction of 50% by week 8 Date Initiated: 03/17/2022 Target Resolution Date: 05/04/2022 Goal Status: Active Ulcer/skin breakdown will have [name] volume reduction of 80% by week 12 Date Initiated: 03/17/2022 Target Resolution Date: 06/01/2022 Goal Status: Active Ulcer/skin breakdown will heal within 14 weeks Date Initiated: 03/17/2022 Target Resolution Date: 06/15/2022 Goal Status: Active Interventions: Assess ulceration(s) every visit Provide education on ulcer and skin care Treatment Activities: Skin care regimen initiated : 03/16/2022 Notes: Electronic Signature(s) Signed: 08/24/2022 4:40:40 PM By: Massie Kluver Signed: 08/24/2022 5:26:54 PM By: Gretta Cool, BSN, RN, CWS, [name] RN, BSN Entered By: Massie Kluver on 08/24/2022 11:21:06 -------------------------------------------------------------------------------- Pain Assessment Details Patient Name: Date of Service: [name] 08/24/2022 10:30 [name]: [id number] Patient Account Number: [id number] Date of Birth/Sex: Treating RN: Jun 16, 1976 (46 y.o. [name] Primary Care Daniele Dillow: Raelene Bott Other Clinician: Massie Kluver Referring Eilidh Marcano: Treating Randen Kauth/Extender: RO BSO N, MICHA EL Hermelinda Dellen, [name] Weeks in Treatment: 23 Active Problems Location of Pain Severity and Description of Pain Patient Has Paino Yes Site Locations Pain Location: Yohannes, Larayah [name] (  [id number]) [id number] Page 7 of 11 Pain Location: Pain in Ulcers Duration of the Pain. Constant / Intermittento Constant Rate the pain. Current  Pain Level: 6 Character of Pain Describe the Pain: Burning Pain Management and Medication Current Pain Management: Medication: Yes Rest: Yes Electronic Signature(s) Signed: 08/24/2022 4:40:40 PM By: Massie Kluver Signed: 08/24/2022 5:26:54 PM By: Gretta Cool, BSN, RN, CWS, [name] RN, BSN Entered By: Massie Kluver on 08/24/2022 11:19:03 -------------------------------------------------------------------------------- Patient/Caregiver Education Details Patient Name: Date of Service: Michel Harrow 11/1/2023andnbsp10:30 [name]: [id number] Patient Account Number: [id number] Date of Birth/Gender: Treating RN: [birth year] (46 y.o. [name] Primary Care Physician: Raelene Bott Other Clinician: Massie Kluver Referring Physician: Treating Physician/Extender: RO BSO N, MICHA EL Hermelinda Dellen, Maeola Harman in Treatment: 23 Education Assessment Education Provided To: Patient Education Topics Provided Wound/Skin Impairment: Handouts: Other: continue wound care as directed Methods: Explain/Verbal Responses: State content correctly Electronic Signature(s) Signed: 08/24/2022 4:40:40 PM By: Massie Kluver Entered By: Massie Kluver on 08/24/2022 11:57:35 [name] ([id number]) [id number] Page 8 of 11 -------------------------------------------------------------------------------- Wound Assessment Details Patient Name: Date of Service: KHRISTEN, [place] 08/24/2022 10:30 [name]: [id number] Patient Account Number: [id number] Date of Birth/Sex: Treating RN: 1976-08-31 (46 y.o. [name], [name] Primary Care Lavaughn Bisig: Raelene Bott Other Clinician: Massie Kluver Referring Rickita Forstner: Treating Arlan Birks/Extender: RO BSO N, MICHA EL Hermelinda Dellen, [name] Weeks in Treatment: 23 Wound Status Wound Number: 11 Primary Open Surgical Wound Etiology: Wound Location: Right, Plantar Foot Wound Open Wounding Event: Surgical  Injury Status: Date Acquired: 12/01/2021 Comorbid Chronic sinus problems/congestion, Middle ear problems, Anemia, Weeks Of Treatment: 23 History: Chronic Obstructive Pulmonary Disease (COPD), Congestive Heart Clustered Wound: No Failure, Type II Diabetes, End Stage Renal Disease, History of pressure wounds, Neuropathy Photos Wound Measurements Length: (cm) 0.6 Width: (cm) 0.6 Depth: (cm) 1.1 Area: (cm) 0.283 Volume: (cm) 0.311 % Reduction in Area: 84.7% % Reduction in Volume: 81.3% Epithelialization: Medium (34-66%) Wound Description Classification: Full Thickness Without Exposed Suppo Wound Margin: Distinct, outline attached Exudate Amount: Medium Exudate Type: Serosanguineous Exudate Color: red, brown rt Structures Foul Odor After Cleansing: No Slough/Fibrino Yes Wound Bed Granulation Amount: Medium (34-66%) Exposed Structure Granulation Quality: Pink, Pale Fascia Exposed: No Necrotic Amount: Medium (34-66%) Fat Layer (Subcutaneous Tissue) Exposed: Yes Tendon Exposed: No Muscle Exposed: No Joint Exposed: No Bone Exposed: No Treatment Notes Wound #11 (Foot) Wound Laterality: Plantar, Right Cleanser Soap and Water Discharge Instruction: Gently cleanse wound with antibacterial soap, rinse and pat dry prior to dressing wounds Julius, Nyari [name] ([id number]) [id number] Page 9 of 11 Peri-Wound Care Topical keystone gel Primary Dressing Aquacel Extra Hydrofiber Dressing, 2x2 (in/in) Secondary Dressing ABD Pad 5x9 (in/in) Discharge Instruction: Cover with ABD pad Secured With Medipore T - 74M Medipore H Soft Cloth Surgical T ape ape, 2x2 (in/yd) Kerlix Roll Sterile or Non-Sterile 6-ply 4.5x4 (yd/yd) Discharge Instruction: Apply Kerlix as directed Compression Wrap Compression Stockings Add-Ons total contact cast Electronic Signature(s) Signed: 08/24/2022 4:40:40 PM By: Massie Kluver Signed: 08/24/2022 5:26:54 PM By: Gretta Cool, BSN, RN, CWS, [name]  RN, BSN Entered By: Massie Kluver on 08/24/2022 11:20:10 -------------------------------------------------------------------------------- Wound Assessment Details Patient Name: Date of Service: [name] 08/24/2022 10:30 [name]: [id number] Patient Account Number: [id number] Date of Birth/Sex: Treating RN: 05-06-1976 (46 y.o. [name] Primary Care Austynn Pridmore: Raelene Bott Other Clinician: Massie Kluver Referring Sieara Bremer: Treating Anesa Fronek/Extender: RO BSO N, MICHA EL Hermelinda Dellen, [name] Weeks in Treatment: 23 Wound Status Wound Number:  12 Primary Diabetic Wound/Ulcer of the Lower Extremity Etiology: Wound Location: Left, Medial, Plantar Foot Wound Open Wounding Event: Pressure Injury Status: Date Acquired: 03/16/2020 Comorbid Chronic sinus problems/congestion, Middle ear problems, Anemia, Weeks Of Treatment: 23 History: Chronic Obstructive Pulmonary Disease (COPD), Congestive Heart Clustered Wound: No Failure, Type II Diabetes, End Stage Renal Disease, History of pressure wounds, Neuropathy Photos Wound Measurements Hutchinson, Ellah [name] ([id number]) Length: (cm) 2 Width: (cm) 0.9 Depth: (cm) 0.2 Area: (cm) 1.414 Volume: (cm) 0.283 [id number] Page 10 of 11 % Reduction in Area: 70% % Reduction in Volume: 70% Epithelialization: Small (1-33%) Wound Description Classification: Grade 3 Wound Margin: Flat and Intact Exudate Amount: Medium Exudate Type: Serous Exudate Color: amber Foul Odor After Cleansing: No Slough/Fibrino No Wound Bed Granulation Amount: Large (67-100%) Exposed Structure Granulation Quality: Red, Pink Fascia Exposed: No Necrotic Amount: None Present (0%) Fat Layer (Subcutaneous Tissue) Exposed: Yes Tendon Exposed: No Muscle Exposed: No Joint Exposed: No Bone Exposed: No Treatment Notes Wound #12 (Foot) Wound Laterality: Plantar, Left, Medial Cleanser Soap and Water Discharge Instruction:  Gently cleanse wound with antibacterial soap, rinse and pat dry prior to dressing wounds Peri-Wound Care Topical Vitamin AandD Ointment, 4(oz) tube Discharge Instruction: apply to red, broken down area Primary Dressing Apligraf Discharge Instruction: #2 applied today Secondary Dressing ABD Pad 5x9 (in/in) Discharge Instruction: Cover with ABD pad Gauze Discharge Instruction: As directed: dry, moistened with saline or moistened with Dakins Solution Secured With Medipore T - 64M Medipore H Soft Cloth Surgical T ape ape, 2x2 (in/yd) Kerlix Roll Sterile or Non-Sterile 6-ply 4.5x4 (yd/yd) Discharge Instruction: Apply Kerlix as directed Compression Wrap Compression Stockings Add-Ons total contact cast Electronic Signature(s) Signed: 08/24/2022 4:40:40 PM By: Massie Kluver Signed: 08/24/2022 5:26:54 PM By: Gretta Cool, BSN, RN, CWS, [name] RN, BSN Entered By: Massie Kluver on 08/24/2022 11:20:41 [name] ([id number]) [id number] Page 11 of 11 -------------------------------------------------------------------------------- Vitals Details Patient Name: Date of Service: JOVITA, PERSING 08/24/2022 10:30 [name]: [id number] Patient Account Number: [id number] Date of Birth/Sex: Treating RN: 12-21-1975 (46 y.o. [name], [name] Primary Care Maurizio Geno: Raelene Bott Other Clinician: Massie Kluver Referring Aisa Schoeppner: Treating Keano Guggenheim/Extender: RO BSO N, MICHA EL Hermelinda Dellen, [name] Weeks in Treatment: 23 Vital Signs Time Taken: 11:10 Temperature (F): 98.1 Height (in): 69 Pulse (bpm): 67 Weight (lbs): 178 Respiratory Rate (breaths/min): 16 Body Mass Index (BMI): 26.3 Blood Pressure (mmHg): 148/98 Reference Range: 80 - 120 mg / dl Electronic Signature(s) Signed: 08/24/2022 4:40:40 PM By: Massie Kluver Entered By: Massie Kluver on 08/24/2022 11:11:22

## 2022-08-24 NOTE — Progress Notes (Addendum)
ADDELYN, ALLEMAN [name] ([id number]) [id number] Page 1 of 18 Visit Report for 08/24/2022 Debridement Details Patient Name: Date of Service: [name], [name] 08/24/2022 10:30 [name]: [id number] Patient Account Number: [id number] Date of Birth/Sex: Treating RN: 27-Oct-1975 (46 y.o. [name] Primary Care Provider: Raelene [name] Other Clinician: Massie [name] Referring Provider: Treating Provider/Extender: RO BSO N, MICHA EL [name], [name] in Treatment: 23 Debridement Performed for Assessment: Wound #12 Left,Medial,Plantar Foot Performed By: Physician [name], MD Debridement Type: Debridement Severity of Tissue Pre Debridement: Fat layer exposed Level of Consciousness (Pre-procedure): Awake and Alert Pre-procedure Verification/Time Out Yes - 11:34 Taken: T Area Debrided (L x W): otal 4 (cm) x 3 (cm) = 12 (cm) Tissue and other material debrided: Viable, Non-Viable, Callus, Slough, Subcutaneous, Slough Level: Skin/Subcutaneous Tissue Debridement Description: Excisional Instrument: Curette Bleeding: Minimum Hemostasis Achieved: Pressure Response to Treatment: Procedure was tolerated well Level of Consciousness (Post- Awake and Alert procedure): Post Debridement Measurements of Total Wound Length: (cm) 2 Width: (cm) 1 Depth: (cm) 0.2 Volume: (cm) 0.314 Character of Wound/Ulcer Post Debridement: Stable Severity of Tissue Post Debridement: Fat layer exposed Post Procedure Diagnosis Same as Pre-procedure Electronic Signature(s) Signed: 08/24/2022 3:59:15 PM By: Heather Ham MD Signed: 08/24/2022 4:40:40 PM By: [name] Signed: 08/24/2022 5:26:54 PM By: [name], BSN, RN, CWS, [name] Entered By: [name] on 08/24/2022 11:37:00 -------------------------------------------------------------------------------- Debridement Details Patient Name: Date of Service: [name] 08/24/2022 10:30 [name] [name],  [name] ([id number]) [id number] Page 2 of 18 Medical Record Number: [id number] Patient Account Number: [id number] Date of Birth/Sex: Treating RN: 1976/02/26 (46 y.o. [name] Primary Care Provider: Raelene [name] Other Clinician: Massie [name] Referring Provider: Treating Provider/Extender: RO BSO N, MICHA EL [name], [name] in Treatment: 23 Debridement Performed for Assessment: Wound #11 Right,Plantar Foot Performed By: Physician [name], MD Debridement Type: Debridement Level of Consciousness (Pre-procedure): Awake and Alert Pre-procedure Verification/Time Out Yes - 11:37 Taken: Start Time: 11:37 T Area Debrided (L x W): otal 1 (cm) x 1 (cm) = 1 (cm) Tissue and other material debrided: Viable, Non-Viable, Callus, Subcutaneous Level: Skin/Subcutaneous Tissue Debridement Description: Excisional Instrument: Curette Bleeding: Minimum Hemostasis Achieved: Pressure End Time: 11:38 Response to Treatment: Procedure was tolerated well Level of Consciousness (Post- Awake and Alert procedure): Post Debridement Measurements of Total Wound Length: (cm) 1 Width: (cm) 1 Depth: (cm) 1.2 Volume: (cm) 0.942 Character of Wound/Ulcer Post Debridement: Stable Post Procedure Diagnosis Same as Pre-procedure Electronic Signature(s) Signed: 08/24/2022 3:59:15 PM By: Heather Ham MD Signed: 08/24/2022 4:40:40 PM By: [name] Signed: 08/24/2022 5:26:54 PM By: [name], BSN, RN, CWS, [name] Entered By: [name] on 08/24/2022 11:39:46 -------------------------------------------------------------------------------- HPI Details Patient Name: Date of Service: [name], [name] 08/24/2022 10:30 [name]: [id number] Patient Account Number: [id number] Date of Birth/Sex: Treating RN: 1975/11/07 (46 y.o. [name] Primary Care Provider: Raelene [name] Other Clinician: Massie [name] Referring Provider: Treating  Provider/Extender: RO BSO N, MICHA EL [name], [name] in Treatment: 23 History of Present Illness HPI Description: 01/18/18-She is here for initial evaluation of the left great toe ulcer. She is [name] poor historian in regards to timeframe in detail. She states approximately 4 [name] ago she lacerated her toe on something in the house. She followed up with her primary care who placed her on Bactrim and ultimately [name] second dose of Bactrim prior to coming to wound clinic. She states she has  been treating the toe with peroxide, Betadine and [name] Band-Aid. She did not check her blood sugar this morning but checked it yesterday morning it was 327; she is unaware of [name] recent A1c and there are no current records. She saw Dr. she would've orthopedics last week for an old injury to the left ankle, she states he did not see her toe, nor did she bring it to his attention. She smokes approximately 1 pack cigarettes [name] day. Her social situation is concerning, she arrives this morning with her mother who appears extremely intoxicated/under the influence; her mother was asked to leave the room and be monitored by the patient's grandmother. The patient's aunt then accompanied the patient and the room throughout the rest of the appointment. We had [name] lengthy discussion regarding the deleterious effects of uncontrolled hyperglycemia and smoking as it relates to wound healing and overall health. She was strongly encouraged to decrease her smoking and get her diabetes under better control. She states she is currently on [name] diet and has cut down her [facility] consumption. The left toe is erythematous, macerated and slightly edematous with malodor present. The edema in her left foot is below her baseline, there is no erythema streaking. We will treat her with Santyl, doxycycline; we have ordered and xray, culture [name], [name] ([id number]) [id number] Page 3 of 18 and provided [name] Peg assist  surgical shoe and cultured the wound. 01/25/18-She is here in follow-up evaluation for [name] left great toe ulcer and presents with an abscess to her suprapubic area. She states her blood sugars remain elevated, feeling "sick" and if levels are below 250, but she is trying. She has made no attempt to decrease her smoking stating that we "can't take away her food in her cigarettes". She has been compliant with offloading using the PEG assist you. She is using Santyl daily. the culture obtained last week grew staph aureus and Enterococcus faecalis; continues on the doxycycline and Augmentin was added on Monday. The suprapubic area has erythema, no femoral variation, purple discoloration, minimal induration, was accessed with [name] cotton tip applicator with sanguinopurulent drainage, this was cultured, I suspect the current antibiotic treatment will cover and we will not add anything to her current treatment plan. She was advised to go to urgent care or ER with any change in redness, induration or fever. 02/01/18-She is here in follow-up evaluation for left great toe ulcers and [name] new abdominal abscess from last week. She was able to use packing until earlier this week, where she "forgot it was there". She states she was feeling ill with GI symptoms last week and was not taking her antibiotic. She states her glucose levels have been predominantly less than 200, with occasional levels between 200-250. She thinks this was contributing to her GI symptoms as they have resolved without intervention. There continues to be significant laceration to left toe, otherwise it clinically looks stable/improved. There is now less superficial opening to the lateral aspect of the great toe that was residual blister. We will transition to [facility] to all wounds, she will continue her Augmentin. If there is no change or deterioration next week for reculture. 02/08/18-She is here in follow-up evaluation for left great toe ulcer  and abdominal ulcer. There is an improvement in both wounds. She has been wrapping her left toe with coban, not by our direction, which has created an area of discoloration to the medial aspect; she has been advised to NOT use coban secondary to her  neuropathy. She states her glucose levels have been high over this last week ranging from 200-350, she continues to smoke. She admits to being less compliant with her offloading shoe. We will continue with same treatment plan and she will follow-up next week. 02/15/18-She is here in follow-up evaluation for left great toe ulcer and abdominal ulcer. The abdominal ulcer is epithelialized. The left great toe ulcer is improved and all injury from last week using the Coban wrap is resolved, the lateral ulcer is healed. She admits to noncompliance with wearing offloading shoe and admits to glucose levels being greater than 300 most of the week. She continues to smoke and expresses no desire to quit. There is one area medially that probes deeper than it has historically, erythema to the toe and dorsal foot has consistently waxed and waned. There is no overt signs of cellulitis or infection but we will culture the wound for any occult infection given the new area of depth and erythema. We will hold off on sensitivities for initiation of antibiotic therapy. 02/22/18-She is here in follow up evaluation for left great toe ulcer. There is overall significant improvement in both wound appearance, erythema and edema with changes made last week. She was not initiated on antibiotic therapy. Culture obtained last week showed oxacillin sensitive staph aureus, sensitive to clindamycin. Clindamycin has been called into the pharmacy but she has been instructed to hold off on initiation secondary to overall clinical improvement and her history of antibiotic intolerance. She has been instructed to contact the clinic with any noted changes/deterioration and the wound, erythema, edema  and/or pain. She will follow-up next week. She continues to smoke and her glucose levels remain elevated >250; she admits to compliance with offloading shoe 03/01/18 on evaluation today patient appears to be doing fairly well in regard to her left first toe ulcer. She has been tolerating the dressing changes with the [facility] Dressing without complication and overall this has definitely showed signs of improvement according to records as well is what the patient tells me today. I'm very pleased in that regard. She is having no pain today 03/08/18 She is here for follow up evaluation of [name] left great toe ulcer. She remains non-compliant with glucose control and smoking cessation; glucose levels consistently >200. She states that she got new shoe inserts/peg assist. She admits to compliance with offloading. Since my last evaluation there is significant improvement. We will switch to prisma at this time and she will follow up next week. She is noted to be tachycardic at this appointment, heart rate 120s; she has [name] history of heart rate 70-130 according to our records. She admits to extreme agitation r/t personal issues; she was advised to monitor her heartrate and contact her physician if it does not return to [name] more normal range (<100). She takes cardizem twice daily. 03/15/18-She is here in follow-up evaluation for left great toe ulcer. She remains noncompliant with glucose control and smoking cessation. She admits to compliance with wearing offloading shoe. The ulcer is improved/stable and we will continue with the same treatment plan and she will follow-up next week 03/22/18-She is here for evaluation for left great toe ulcer. There continues to be significant improvement despite recurrent hyperglycemia (over 500 yesterday) and she continues to smoke. She has been compliant with offloading and we will continue with same treatment plan and she will follow-up next week. 03/29/18-She is here for evaluation  for left great toe ulcer. Despite continuing to smoke and uncontrolled diabetes  she continues to improve. She is compliant with offloading shoe. We will continue with the same treatment plan and she will follow-up next week 04/05/18- She is here in follow up evaluation for [name] left great toe ulcer; she presents with small pustule to left fifth toe (resembles ant bite). She admits to compliance with wearing offloading shoe; continues to smoke or have uncontrolled blood glucose control. There is more callus than usual with evidence of bleeding; she denies known trauma. 04/12/18-She is here for evaluation of left great toe ulcer. Despite noncompliance with glycemic control and smoking she continues to make improvement. She continues to wear offloading shoe. The pustule, that was identified last week, to the left fifth toe is resolved. She will follow-up in 2 [name] 05/03/18-she is seen in follow-up evaluation for [name] left great toe ulcer. She is compliant with offloading, otherwise noncompliant with glycemic control and smoking. She has plateaued and there is minimal improvement noted. We will transition to [facility], replaced the insert to her surgical shoe and she will follow-up in one week 05/10/18- She is here in follow up evaluation for [name] left great toe ulcer. It appears stable despite measurement change. We will continue with same treatment plan and follow up next week. 05/24/18-She is seen in follow-up evaluation for [name] left great toe ulcer. She remains compliant with offloading, has made significant improvement in her diet, decreasing the amount of sugar/soda. She said her recent A1c was 10.9 which is lower than. She did see [name] diabetic nutritionist/educator yesterday. She continues to smoke. We will continue with the same treatment plan and she'll follow-up next week. 05/31/18- She is seen in follow-up evaluation for left great toe ulcer. She continues to remain compliant with offloading, continues to  make improvement in her diet, increasing her water and decreasing the amount of sugar/soda. She does continue to smoke with no desire to quit. We will apply Prisma to the depth and Hydrofera Blue over. We have not received insurance authorization for oasis. She will follow up next week. 06/07/18-She is seen in follow-up evaluation for left great toe ulcer. It has stalled according to today's measurements although base appears stable. She says she saw [name] diabetic educator yesterday; her average blood sugars are less than 300 which is an improvement for her. She continues to smoke and states "that's my next step" She continues with water over soda. We will order for xray, culture and reinstate ace wrap compression prior to placing apligraf for next week. She is voicing no complaints or concerns. Her dressing will change to iodoflex over the next week in preparation for apligraf. 06/14/18-She is seen in follow-up evaluation for left great toe ulcer. Plain film x-ray performed last week was negative for osteomyelitis. Wound culture obtained last week grew strep B and OSSA; she is initiated on keflex and cefdinir today; there is erythema to the toe which could be from ace wrap compression, she has [name] history of wrapping too tight and has has been encouraged to maintain ace wraps that we place today. We will hold off on application of apligraf today, will apply next week after antibiotic therapy has been initiated. She admits today that she has resumed taking [name] shower with her foot/toe submerged in water, she has been reminded to keep foot/toe out of the bath water. She will be seen in follow up next week 06/21/18-she is seen in follow-up evaluation for left great toe ulcer. She is tolerating antibiotic therapy with no GI disturbance. The wound is  stable. Apligraf was applied today. She has been decreasing her smoking, only had 4 cigarettes yesterday and 1 today. She continues being more compliant in diabetic  diet. She will follow-up next week for evaluation of site, if stable will remove at 2 [name]. 06/28/18- She is here in follow up evalution. Apligraf was placed last week, she states the dressing fell off on Tuesday and she was dressing with hydrofera blue. She is healed and will be discharged from the clinic today. She has been instructed to continue with smoking cessation, continue monitoring glucose levels, offloading for an additional 4 [name] and continue with hydrofera blue for additional two [name] for any possible microscopic opening. Readmission: 08/07/18 on evaluation today patient presents for reevaluation concerning the ulcer of her right great toe. She was previously discharged on 06/28/18 healed. Nonetheless she states that this began to show signs of drainage she subsequently went to her primary care provider. Subsequently an x-ray was performed on 08/01/18 which was negative. The patient was also placed on antibiotics at that time. Fortunately they should have been effective for the infection. Nonetheless she's been experiencing some improvement but still has [name] lot of drainage coming from the wound itself. 08/14/18 on evaluation today patient's wound actually does show signs of improvement in regard to the erythema at this point. She has completed the antibiotics. With that being said we did discuss the possibility of placing her in [name] total contact cast as of today although I think that I may want to give this just [name] little bit more time to ensure nothing recurrence as far as her infection is concerned. I do not want to put in the cast and risk infection at that time if things are not completely resolved. With that being said she is gonna require some debridement today. 08/21/18 on evaluation today patient actually appears to be doing okay in regard to her toe ulcer. She's been tolerating the dressing changes without complication. With that being said it does appear that she is ready and in  fact I think it's appropriate for [place] to go ahead and initiate the total contact cast today. Nonetheless she will require some sharp debridement to prepare the wound for application. Overall I feel like things have been progressing well but we do need AZRIELLE, SPRINGSTEEN [name] ([id number]) [id number] Page 4 of 18 to do something to get this to close more readily. 08/24/18 patient seen today for reevaluation after having had the total contact cast applied on Tuesday. She seems to have done very well the wound appears to be doing great and overall I'm pleased with the progress that she's made. There were no abnormal areas of rubbing from the cast on her lower extremity. 08/30/18 on evaluation today patient actually appears to be completely healed in regard to her plantar toe ulcer. She tells me at this point she's been having [name] lot of issues with the cast. She almost fell [name] couple of times the state shall the step of her dog [name] couple times as well. This is been [name] very frustrating process for her other nonetheless she has completely healed the wound which is excellent news. Overall there does not appear to be the evidence of infection at this time which is great news. 09/11/18 evaluation today patient presents for follow-up concerning her great toe ulcer on the left which has unfortunately reopened since I last saw her which was only [name] couple of [name] ago. Unfortunately she was not able to get in to get  the shoe and potentially the AFO that's gonna be necessary due to her left foot drop. She continues with offloading shoe but this is not enough to prevent her from reopening it appears. When we last had her in the total contact cast she did well from [name] healing standpoint but unfortunately the wound reopened as soon as she came out of the cast within just [name] couple of [name]. 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 This is also part of the reason why she falls on [name] regular basis. I just do not believe that is gonna be safe for [place] to reinitiate the total contact cast as last time we had this on she fell 3 times one week which is definitely not normal for her. 09/18/18 upon evaluation today the patient actually appears to be doing about the same in regard to her toe ulcer. She did not contact [employer] as I asked her to even though I had given her the prescription. In fact she actually states that she has no idea where the prescription is. She did apparently call [employer] and they told her that all she needed to do was bring the prescription in order to be able to be seen and work on getting the AFO for her left foot. With all that being said she still does not have an appointment and I'm not sure were things stand that regard. I will give her [name] new prescription today in order to contact them to get this set up. 09/25/18 on evaluation today patient actually appears to be doing about the same in regard to her toes ulcer. She does have [name] small areas which seems to have [name] lot of callous buildup around the edge of the wound which is going to need sharp debridement today. She still is waiting to be scheduled for evaluation with [facility] for possibility of an AFO. She states there supposed to call her tomorrow to get this set up. Unfortunately it does appear that her foot specifically the toe area is showing signs of erythema. There does not appear to be any systemic infection which is in these good news. 10/02/18 on evaluation today patient actually appears to be doing about the same in regard to her toe ulcer. This really has not done too well although it's not significantly larger it's also not significantly smaller. She has been tolerating the dressing changes without complication. She actually has her  appointment with [facility] and [facility] tomorrow to hopefully be measured for obtaining and AFO splint. I think this would be helpful preventing this from reoccurring. We had contemplated starting the cast this week although to be honest I am reluctant to do that as she's been having nausea, vomiting, and seizure activity over the past three days. She has [name] history of seizures and have been told is nothing that can be done for these. With that being said I do believe that along with the seizures have the nausea vomiting which upon further questioning doesn't seem to be the normal for her and makes me concerned for the possibility of infection or something else going on. I discussed this with the patient and her mother during the office visit today. I do not feel the wound is effective but maybe something else. The responses  this was "this just happens to her at times and we don't know why". They did not seem to be interested in going to the hospital to have this checked out further. 10/09/18 on evaluation today patient presents for follow-up concerning her ongoing toe ulcer. She has been tolerating the dressing changes without complication. Fortunately there does not appear to be any evidence of infection which is great news however I do think that the patient would benefit from going ahead for with the total contact cast. She's actually in [name] wheelchair today she tells me that she will use her walker if we initiate the cast. I was very specific about the fact that if we were gonna do the cast I wanted to make sure that she was using the walker in order to prevent any falls. She tells me she does not have stairs that she has to traverse on [name] regular basis at her home. She has not had any seizures since last week again that something that happens to her often she tells me she did talk to [employer] and they said that it may take up to three [name] to get the brace approved for her. Hopefully that will not  take that long but nonetheless in the meantime I do think the cast could be of benefit. 10/12/18 on evaluation today patient appears to be doing rather well in regard to her toe ulcer. It's just been [name] few days and already this is significantly improved both as far as overall appearance and size. Fortunately there's no sign of infection. She is here for her first obligatory cast change. 10/19/18 Seen today for follow up and management of left great toe ulcer. Wound continues to show improvement. Noted small open area with seroussang drainage with palpation. Denies any increased pain or recent fevers during visit. She will continue calcium alginate with offloading shoe. Denies any questions or concerns during visit. 10/26/18 on evaluation today patient appears to be doing about the same as when I last saw her in regard to her wound bed. Fortunately there does not appear to be any signs of infection. Unfortunately she continues to have [name] breakdown in regard to the toe region any time that she is not in the cast. It takes almost no time at all for this to happen. Nonetheless she still has not heard anything from the brace being made by [employer] as to when exactly this will be available to her. Fortunately there is no signs of infection at this time. 10/30/18 on evaluation today patient presents for application of the total contact cast as we just received him this morning. Fortunately we are gonna be able to apply this to her today which is great news. She continues to have no significant pain which is good news. Overall I do feel like things have been improving while she was the cast is when she doesn't have [name] cast that things get worse. She still has not really heard anything from [facility] regarding her brace. 11/02/18 upon evaluation today patient's wound already appears to be doing significantly better which is good news. Fortunately there does not appear to be any signs of infection also good news. Overall  I do think the total contact cast as before is helping to heal this area unfortunately it's just not gonna likely keep the area closed and healed without her getting her brace at least. Again the foot drop is [name] significant issue for her. 11/09/18 on evaluation today patient appears to be doing excellent  in regard to her toe ulcer which in fact is completely healed. Fortunately we finally got the situation squared away with the paperwork which was needed to proceed with getting her brace approved by Medicaid. I have filled that out unfortunately that information has been sent to the orthopedic office that I worked at 2 1/2 years ago and not tired Current wound care measures. Fortunately she seems to be doing very well at this time. 11/23/18 on evaluation today patient appears to be doing More Poorly Compared to Last Time I Saw Her. At [facility] She Had Completely Healed. Currently she is continuing to have issues with reopening. She states that she just found out that the brace was approved through Medicaid now she just has to go get measured in order to have this fitted for her and then made. Subsequently she does not have an appointment for this yet that is going to complicate things we obviously cannot put her back in the cast if we do not have everything measured because they're not gonna be able to measure her foot while she is in the cast. 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 Obviously this is unfortunate and does have me somewhat worried as well. 11/30/18 on evaluation today patient's toe ulcer actually appears to be doing fairly well. The good news is she will be getting her brace in the shoes next week on Wednesday. Hopefully we will be able to get this to heal without having to go back in the cast however she may need the cast in order to get the wound completely heal and then go from there.  Fortunately there's no signs of infection at this time. 12/07/18 on evaluation today patient fortunately did receive her brace and she states she could tell this definitely makes her walk better. With that being said she's been having issues with her toe where she noticed yesterday there was [name] lot of tissue that was loosing off this appears to be much larger than what it was previous. She also states that her leg has been read putting much across the top of her foot just about the ankle although this seems to be receiving somewhat. The total area is still red and appears to be someone infected as best I can tell. She is previously taken Bactrim and that may be [name] good option for her today as well. We are gonna see what I wound culture shows as well and I think that this is definitely appropriate. With that being said outside of the culture I still need to initiate something in the interim and that's what I'm gonna go ahead and select Bactrim is [name] good option for her. 12/14/18 on evaluation today patient appears to be doing better in regard to her left great toe ulcer as compared to last week's evaluation. There's still some erythema although this is significantly improved which is excellent news. Overall I do believe that she is making good progress is still gonna take some time ARYNN, ARMAND [name] ([id number]) [id number] Page 5 of 18 before she is where I would like her to be from the standpoint of being able to place her back into the total contact cast. Hopefully we will be where we need to be by next week. 12/21/18 on evaluation today patient actually appears to be doing poorly in regard to her toe ulcer. She's been tolerating the  dressing changes without complication. Fortunately there's no signs of systemic infection although she does have [name] lot of drainage from the toe ulcer and this does seem to be causing some issues at this point. She does have erythema on the distal  portion of her toe that appears to be likely cellulitis. 12/28/18 on evaluation today patient actually appears to be doing [name] little better in my pinion in regard to her toe ulcer. With that being said she still does have some evidence of infection at this time and for her culture she had both E. coli as well as enterococcus as organisms noted on evaluation. For that reason I think that though the Keflex likely has treated the E. coli rather well this has really done nothing for the enterococcus. We are going to have to initiate treatment for this specifically. 01/04/19 on evaluation today patient's toe actually appears to be doing better from the standpoint of infection. She currently would like to see about putting the cash back on I think that this is appropriate as long as she takes care of it and keeps it from getting wet. She is gonna have some drainage we can definitely pass this up with Drawtex and alginate to try to prevent as much drainage as possible from causing the problems. With that being said I do want to at least try her with the cast between now and Tuesday. If there any issues we can't continue to use it then I will discontinue the use of the cast at that point. 01/08/19 on evaluation today patient actually appears to be doing very well as far as her foot ulcer specifically the great toe on the left is concerned. She did have an area of rubbing on the medial aspect of her left ankle which again is from the cast. Fortunately there's no signs of infection at this point in this appears to be [name] very slight skin breakdown. The patient tells me she felt it rubbing but didn't think it was that bad. Fortunately there is no signs of active infection at this time which is good news. No fevers, chills, nausea, or vomiting noted at this time. 01/15/19 on evaluation today patient actually appears to be doing well in regard to her toe ulcer. Again as previous she seems to do well and she has the cast  on which indicates to me that during the time she doesn't have [name] cast on she's putting way too much pressure on this region. Obviously I think that's gonna be an issue as with the current national emergency concerning the Covid-19 Virus it has been recommended that we discontinue the use of total contact casting by the chief medical officer of our company, Dr. Simona Huh. The reasoning is that if [name] patient becomes sick and cannot come into have the cast removed they could not just leave this on for an additional two [name]. Obviously the hospitals also do not want to receive patient's who are sick into the emergency department to potentially contaminate the region and spread the Covid-19 Virus among other sick individuals within the hospital system. Therefore at this point we are suspending the use of total contact cast until the current emergency subsides. This was all discussed with the patient today as well. 01/22/19 on evaluation today patient's wound on her left great toe appears to be doing slightly worse than previously noted last week. She tells me that she has been on this quite [name] bit in fact she tells me she's been awake  for 38 straight hours. This is due to the fact that she's having to care for grandparents because nobody else will. She has been taking care of them for five the last seven days since I've seen her they both have dementia his is from [name] stroke and her grandmother's was progressive. Nonetheless she states even her mom who knows her condition and situation has only help two of those days to take care of them she's been taking care of the rest. Fortunately there does not appear to be any signs of active infection in regard to her toe at this point although obviously it doesn't look as good as it did previous. I think this is directly related to her not taking off the pressure and friction by way of taking things easy. Though I completely understand what's going on. 01/29/19 on evaluation  today patient's tools are actually appears to be showing some signs of improvement today compared to last week's evaluation as far as not necessarily the overall size of the wound but the fact that she has some new skin growth in between the two ends of the wound opening. Overall I feel like she has done well she states that she had [name] family member give her what sounds to be [name] CAM walker boot which has been helpful as well. 02/05/19 on evaluation today patient's wound bed actually appears to be doing significantly better in regard to her overall appearance of the size of the wound. With that being said she is still having an issue with offloading efficiently enough to get this to close. Apparently there is some signs of infection at this point as well unfortunately. Previously she's done well of Augmentin I really do not see anything that needs to be culture currently but there theme and cellulitis of the foot that I'm seeing I'm gonna go ahead and place her on an antibiotic today to try to help clear this up. 02/12/2019 on evaluation today patient actually appears to be doing poorly in regard to her overall wound status. She tells me she has been using her offloading shoe but actually comes in today wearing her tennis shoe with the AFO brace. Again as I previously discussed with her this is really not sufficient to allow the area to heal appropriately. Nonetheless she continues to be somewhat noncompliant and I do wonder based on what she has told my nurse in the past as to whether or not [name] good portion of this noncompliance may be recreational drug and alcohol related. She has had [name] history of heroin overdose and this was fairly recently in the past couple of months that have been seeing her. Nonetheless overall I feel like her wound looks significantly worse today compared to what it was previous. She still has significant erythema despite the Augmentin I am not sure that this is an appropriate  medication for her infection I am also concerned that the infection may have gone down into her bone. 02/19/19 on evaluation today patient actually appears to be doing about the same in regard to her toe ulcer. Unfortunately she continues to show signs of bone exposure and infection at this point. There does not appear to be any evidence of worsening of the infection but I'm also not really sure that it's getting significantly better. She is on the Augmentin which should be sufficient for the Staphylococcus aureus infection that she has at this point. With that being said she may need IV antibiotics to more appropriately treat this.  We did have [name] discussion today about hyperbaric option therapy. 02/28/19 on evaluation today patient actually appears to be doing much worse in regard to the wound on her left great toe as compared to even my previous evaluation last week. Unfortunately this seems to be training in [name] pretty poor direction. Her toe was actually now starting to angle laterally and I can actually see the entire joint area of the proximal portion of the digit where is the distal portion of the digit again is no longer even in contact with the joint line. Unfortunately there's [name] lot more necrotic tissue around the edge and the toe appears to be showing signs of becoming gangrenous in my pinion. I'm very concerned about were things stand at this point. She did see infectious disease and they are planning to send in [name] prescription for Sivextro for her and apparently this has been approved. With that being said I don't think she should avoid taking this but at the same time I'm not sure that it's gonna be sufficient to save her toe at this point. She tells me that she still having to care for grandparents which I think is putting quite [name] bit of strain on her foot and specifically the total area and has caused this to break down even to [name] greater degree than would've otherwise been expected. 03/05/19 on  evaluation today patient actually appears to be doing quite well in regard to her toe all things considering. She still has bone exposed but there appears to be much less your thing on overall the appearance of the wound and the toe itself is dramatically improved. She still does have some issues currently obviously with infection she did see vascular as well and there concerned that her blood flow to the toad. For that reason they are setting up for an angiogram next week. 03/14/19 on evaluation today patient appears to be doing very poor in regard to her toe and specifically in regard to the ulceration and the fact that she's starting to notice the toe was leaning even more towards the lateral aspect and the complete joint is visible on the proximal aspect of the joint. Nonetheless she's also noted [name] significant odor and the tip of the toe is turning more dark and necrotic appearing. Overall I think she is getting worse not better as far as this is concerned. For that reason I am recommending at this point that she likely needs to be seen for likely amputation. READMISSION 03/19/2021 This is [name] patient that we cared for in this clinic for [name] prolonged period of time in 2019 and 2020 with [name] left foot and left first toe wound. 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 Luana Shu. In December 2021 she had an ulcer on her right great toe as well as the fourth and fifth toes. 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 She also had an angiogram at that time and underwent angioplasty of the right anterior tibial artery. In any case she claims that the wound on the right foot is closed I did not look at this today which was probably an oversight although I think that should be done next week. After her surgery she developed [name] dehiscence but I do not see any  follow-up of this. According to Dr. Deborra Medina last review that she was out  of the area being cared for by another physician but recently came back to his attention. 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 [name] culture of this area showed E. coli apparently before she came back to see Dr. Luana Shu she was supposed to be receiving antibiotics but she did not really take them. Nor is she offloading this area at all. Finally her last hemoglobin A1c listed in epic was in March 2022 at 14.1 she says things are [name] lot better since then although I am not sure. 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 She had this wound at the time. During this admission she had E. coli sepsis [name] TEE was negative. 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 She is still [name] heavy cigarette smoker. She has [name] history of bipolar disorder seizure disorder ABI in our clinic was 1.05 6/1; left midfoot in the setting of [name] TMA done previously. Round circular wound with [name] "knuckle" of protruding tissue. The problem is that the knuckle was not attached to any of the surrounding granulation and this probed proximally widely I removed [name] large portion of this tissue. This wound goes with considerable undermining laterally. I do not feel any bone there was no purulence but this is [name] deep wound. 6/8; in spite of the debridement I did last week. She arrives with [name] wound looking exactly the same. [name] protruding "knuckle" of tissue nonadherent to most of the surrounding tissue. There is considerable depth around this from  6-12 o'clock at 2.7 cm and undermining of 1 cm. This does not look overtly infected and the x- ray I did last week was negative for any osseous abnormalities. We have been using silver collagen 6/15; deep tissue culture I did last week showed moderate staph aureus and moderate Pseudomonas. This will definitely require prolonged antibiotic therapy. The pathology on the protuberant area was negative for malignancy fungus etc. 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 We have been using silver collagen. I am going to be prescribing Levaquin for 2 [name]. Her CT scan of the foot is down for 7/5 6/22; CT scan of the foot on 7 5. She says she has hardware in the left leg from her previous fracture. She is on the Levaquin for the deep tissue culture I did that showed methicillin sensitive staph aureus and Pseudomonas. I gave her [name] 2-week supply and she will have another week. She arrives in clinic today with the same protuberant tissue however this is nonadherent to the tissue surrounding it. I am really at [name] loss to explain this unless there is underlying deep tissue infection 6/29; patient presents for 1 week follow-up. She has been using collagen to the wound bed. She reports taking her antibiotics as prescribed.She has no complaints or issues today. She denies signs of infection. 7/6; patient presents for one week followup. She has been using collagen to the wound bed. She states she is taking Levaquin however at times she is not able to keep it down. She denies signs of infection. 7/13; patient presents for 1 week follow-up. She has been using silver alginate to the  wound bed. She still has nausea when taking her antibiotics. She denies signs of infection. 7/20; patient presents for 1 week follow-up. She has been using silver alginate with gentamicin cream to the wound bed. She denies any issues and has no complaints today. She denies signs of  infection. 7/27; patient presents for 1 week follow-up. She continues to use silver alginate with gentamicin cream to the wound bed. She reports starting her antibiotics. She has no issues or complaints. Overall she reports stability to the wound. 8/3; patient presents for 1 week follow-up. She has been using silver alginate with gentamicin cream to the wound bed. She reports completing all antibiotics. She has no issues or complaints today. She denies signs of infection. 8/17; patient presents for 2-week follow-up. He is to use silver alginate to the wound bed. She has no issues or complaints today. She denies signs of infection. She reports her pain has improved in her foot since last clinic visit 8/24; patient presents for 1 week follow-up. She continues to use silver alginate to the wound bed. She has no issues or complaints. She denies signs of infection. Pain is stable. 9/7; patient presents for follow-up. She missed her last week appointment due to feeling ill. She continues to use silver alginate. She has [name] new wound to the right lower extremity that is covered in eschar. She states It occurred over the past week and has no idea how it started. She currently denies signs of infection. 9/14; patient presents for follow-up. T the left foot wound she has been using gentamicin cream and silver alginate. T the right lower extremity wound she has o o been keeping this covered and has not obtain Santyl. 9/21; patient presents for follow-up. She reports using gentamicin cream and silver alginate to the left foot and Santyl to the right lower extremity wound. She has no issues or complaints today. She denies signs of infection. 9/28; patient presents for follow-up. She reports [name] new wound to her right heel. She states this occurred [name] few days ago and is progressively gotten worse. She has been trying to clean the area with [name] Q-tip and Santyl. She reports stability in the other 2 wounds. She has been  using gentamicin cream and silver alginate to the left foot and Santyl to the right lower extremity wound. 10/12; patient presents for follow-up. She reports improvement to the wound beds. She is seeing vein and vascular to discuss the potential of [name] left BKA. She states they are going to do an arteriogram. She continues to use silver alginate with dressing changes to her wounds. 11/2; patient presents for follow-up. She states she has not been doing dressing changes to the wound beds. She states she is not able to offload the areas. She reports chronic pain to her left foot wound. 11/9; patient presents for follow-up. She came in with only socks on. She states she forgot to put on shoes. It is unclear if she is doing any dressing changes. She currently denies systemic signs of infection. 11/16; patient presents for follow-up. She came again only with socks on. She states she does not wear shoes ever. It is unclear if she does dressing changes. She currently denies systemic signs of infection. 11/23; patient presents for follow-up. She wore her shoes today. It still unclear exactly what dressing she is using for each wound but she did states she obtained Dakin's solution and has been using this to the left foot wound. She currently denies signs of  infection. 11/30; patient presents for follow-up. She has no issues or complaints today. She currently denies signs of infection. 12/7; patient presents for follow-up. She has no issues or complaints today. She has been using Hydrofera Blue to the right heel wound and Dakin solution to the left foot wound. Her right anterior leg wound is healed. She currently denies signs of infection. 12/14; patient presents for follow-up. She has been using Hydrofera Blue to the right heel and Dakin's to the left foot wounds. She has no issues or complaints today. She denies signs of infection. 12/21; patient presents for follow-up. She reports using Hydrofera Blue to  the right heel and Dakin's to the left foot wound. She denies signs of infection. 12/28; patient presents for follow-up. She continues to use Dakin's to the left foot wound and Hydrofera Blue to the right heel wound. She denies signs of infection. 1/4; patient presents for follow-up. She has no issues or complaints today. She denies signs of infection. 1/11; patient presents for follow-up. It is unclear if she has been dressing these wounds over the past week. She currently denies signs of infection. 1/18; patient presents for follow-up. She states she has been using Dakin's wet-to-dry dressings to the left foot. She has been using Hydrofera Blue to the right foot foot wound. She states that the anterior right leg wound has reopened and draining serous fluid. She denies signs of infection. 1/25; patient presents for follow-up. She has no issues or complaints today. 2/1; patient presents for follow-up. She has no issues or complaints today. She denies signs of infection. 2/8; patient presents for follow-up. She has lost her surgical shoes. She did not have [name] dressing to the right heel wound. She currently denies signs of infection. [name], [name] ([id number]) [id number] Page 7 of 18 2/15; patient presents for follow-up. She reports more pain to the right heel today. She denies purulent drainage Or fever/chills 2/22; patient presents for follow-up. She reports taking clindamycin over the past week. She states that she continues to have pain to her right heel. She reports purulent drainage. Readmission 03/16/2022 Ms. Mahathi Pokorney is [name] 46 year old female with [name] past medical history of type 2 diabetes, osteomyelitis to her feet, chronic systolic heart failure and bipolar disorder that presents to the clinic for bilateral feet wounds and right lower extremity wound. She was last seen in our clinic on 12/15/2021. At that time she had purulent drainage coming out of her right  plantar foot and I recommended she go to the ED. She states she went to [facility] and has been there for the past 3 months. I cannot see the records. She states she had OR debridement and was on several [name] of IV antibiotics while inpatient. Since discharge she has not been taking care of the wound beds. She had nothing on her feet other than socks today. She currently denies signs of infection. 5/31; patient presents for follow-up. She has been using Dakin's wet-to-dry dressings to the wound beds on her feet bilaterally and antibiotic ointment to the right anterior leg wound. She had [name] wound culture done at last clinic visit that showed moderate Pseudomonas aeruginosa sensitive to ciprofloxacin. She currently denies systemic signs of infection. 6/14; patient presents for follow-up. She received Keystone 5 days ago and has been using this on the wound beds. She states that last week she had to go to the hospital because she had increased warmth and erythema to the right foot. She was started on  2 oral antibiotics. She states she has been taking these. She currently denies systemic signs of infection. She has no issues or complaints today. 6/21; patient presents for follow-up. She states she has been using Keystone antibiotics to the wound beds. She has no issues or complaints today. She denies signs of infection. 6/28; patient presents for follow-up. She has been using Keystone antibiotics to the wound beds. She has no issues or complaints today. 7/12; patient presents for follow-up. Has been using Keystone antibiotics to the wound beds with calcium alginate. She has no issues or complaints today. She never followed up with her orthopedic surgeon who did the OR debridement to the right foot. We discussed the total contact cast for the left foot and patient would like to do this next week. 7/19; patient presents for follow-up. She has been using Keystone antibiotics with calcium  alginate to the wound beds. She has no issues or complaints today. Patient is in agreement to do the total contact cast of the left foot today. She knows to return later this week for the obligatory cast change. 05-13-2022 upon evaluation today patient's wound which she has the cast of the left leg actually appears to be doing significantly better. Fortunately I do not see any signs of active infection locally or systemically which is great news and overall I am extremely pleased with where we stand currently. 7/26; patient presents for follow-up. She has [name] cast in place for the past week. She states it irritated her shin. Other than that she tolerated the cast well. She states she would like [name] break for 1 week from the cast. We have been using Keystone antibiotic and Aquacel to both wound beds. She denies signs of infection. 8/2; patient presents for follow-up. She has been using Keystone and Aquacel to the wound beds. She denies any issues and has no complaints. She is agreeable to have the cast placed today for the left leg. 06-03-2022 upon evaluation today patient appears to be doing well with regard to her wound she saw some signs of improvement which is great news. Fortunately I do not see any evidence of active infection locally or systemically at this time which is great news. No fevers, chills, nausea, vomiting, or diarrhea. 8/16; patient presents for follow-up. She has no issues or complaints today. We have been using Keystone and Aquacel to the wound beds. The left lower extremity is in [name] total contact cast. She is tolerated this well. 8/23; patient presents for follow-up. She has had the total contact cast on the left leg for the past week. Unfortunately this has rubbed and broken down the skin to the medial foot. She currently denies signs of infection. She has been using Keystone antibiotic to the right plantar foot wound. 8/30; patient presents for follow-up. We have held off on the  total contact cast for the left leg for the past week. Her wound on the left foot has improved and the previous surrounding breakdown of skin has epithelialized. She has been using Keystone antibiotic to both wound beds. She has no issues or complaints today. She denies signs of infection. 9/6; patient presents for follow-up. She has ordered her's Keystone antibiotic and this is arriving this week. She has been doing Dakin's wet-to-dry dressings to the wound beds. She denies signs of infection. She is agreeable to the total contact cast today. 9/13; patient presents for follow-up. She states that the cast caused her left leg shin to be sore. She would like  to take [name] break from the cast this week. She has been using Keystone antibiotic to the right plantar foot wound. She denies signs of infection. 9/20; patient presents for follow-up. She has been using Keystone antibiotics to the wound beds with calcium alginate to the right foot wound and Hydrofera Blue to the left foot wound. She is agreeable to having the cast placed today. She has been approved for Apligraf and we will order this for next clinic visit. 9/27; patient presents for follow-up. We have been using Keystone antibiotic with Hydrofera Blue to the left foot wound under [name] total contact cast. T the right o foot wound she has been using Keystone antibiotic and calcium alginate. She declines [name] total contact cast today. Apligraf is available for placement and she would like to proceed with this. 07-28-2022 upon evaluation today patient appears to be doing well currently in regard to her wound. She is actually showing signs of significant improvement which is great news. Fortunately I do not see any evidence of active infection locally nor systemically at this time. She has been seeing Dr. Heber Ravenden and to be honest has been doing very well with the cast. Subsequently she comes in today with [name] cast on and we did reapply that today as well. She did  not really want to she try to talk me out of that but I explained that if she wanted to heal this is really the right way to go. Patient voiced understanding. In regard to her right foot this is actually [name] lot better compared to the last time I saw her which is also great news. 10/11; patient presents for follow-up. Apligraf and the total contact cast was placed to the left leg at last clinic visit. She states that her right foot wound had burning pain to it with the placement of Apligraf to this area. She has been doing [place] over this area. She denies signs of infection including increased warmth, erythema or purulent drainage. 11/1; 3-week follow-up. The patient fortunately did not have [name] total contact cast or an Apligraf and on the left foot. She has been using Keystone ABD pads and kerlix and her own running shoes She arrives in clinic today with thick callus and [name] very poor surface on the left foot on the right nonviable skin subcutaneous tissue and [name] deep probing hole. Electronic Signature(s) Signed: 08/24/2022 3:59:15 PM By: Heather Ham MD Entered By: [name] on 08/24/2022 11:56:29 [name] ([id number]) [id number] Page 8 of 18 -------------------------------------------------------------------------------- Physical Exam Details Patient Name: Date of Service: IYARI, HAGNER 08/24/2022 10:30 [name]: [id number] Patient Account Number: [id number] Date of Birth/Sex: Treating RN: 06-04-1976 (46 y.o. [name] Primary Care Provider: Raelene [name] Other Clinician: Massie [name] Referring Provider: Treating Provider/Extender: RO BSO N, MICHA EL [name], [name] in Treatment: 23 Constitutional Patient is hypertensive.. Pulse regular and within target range for patient.Marland Kitchen Respirations regular, non-labored and within target range.. Temperature is normal and within the target range for the patient.Marland Kitchen appears in no  distress. Notes 1. We continued the [facility] with silver alginate. The patient is changing the dressing herself 2. Had [name] fairly long discussion about offloading these wounds made it clear that we are not going to heal these without changing what is going on with regards to pressure on the wounds. The patient would not make [name] decision. 3. The area on the right heel has about 1.3 cm of depth but no palpable bone.  The depth of this is worrisome additional imaging studies may be necessary. Electronic Signature(s) Signed: 08/24/2022 3:59:15 PM By: Heather Ham MD Entered By: [name] on 08/24/2022 11:58:29 -------------------------------------------------------------------------------- Physician Orders Details Patient Name: Date of Service: [name] 08/24/2022 10:30 [name]: [id number] Patient Account Number: [id number] Date of Birth/Sex: Treating RN: [birth year] (46 y.o. [name] Primary Care Provider: Raelene [name] Other Clinician: Massie [name] Referring Provider: Treating Provider/Extender: RO BSO N, MICHA EL [name], [name] in Treatment: 23 Verbal / Phone Orders: No Diagnosis Coding Follow-up Appointments Return Appointment in 1 week. Nurse Visit as needed Bathing/ Shower/ Hygiene May shower with wound dressing protected with water repellent cover or cast protector. No tub bath. Anesthetic (Use 'Patient Medications' Section for Anesthetic Order Entry) Lidocaine applied to wound bed Edema Control - Lymphedema / Segmental Compressive Device / Other Elevate, Exercise Daily and [name] void Standing for Long Periods of Time. Elevate legs to the level of the heart and pump ankles as often as possible Elevate leg(s) parallel to the floor when sitting. KIMYATA, MILICH [name] ([id number]) [id number] Page 9 of 18 Off-Loading Other: - keep pressure off of feet. Medications-Please add to medication list. Keystone Compound -  Keystone gel right foot Wound Treatment Wound #11 - Foot Wound Laterality: Plantar, Right Cleanser: Soap and Water Every Other Day/30 Days Discharge Instructions: Gently cleanse wound with antibacterial soap, rinse and pat dry prior to dressing wounds Topical: keystone gel Every Other Day/30 Days Prim Dressing: Aquacel Extra Hydrofiber Dressing, 2x2 (in/in) [name] Every Other Day/30 Days Secondary Dressing: ABD Pad 5x9 (in/in) (Generic) Every Other Day/30 Days Discharge Instructions: Cover with ABD pad Secured With: Medipore T - 62M Medipore H Soft Cloth Surgical T ape ape, 2x2 (in/yd) (Generic) Every Other Day/30 Days Secured With: Kerlix Roll Sterile or Non-Sterile 6-ply 4.5x4 (yd/yd) (Generic) Every Other Day/30 Days Discharge Instructions: Apply Kerlix as directed Wound #12 - Foot Wound Laterality: Plantar, Left, Medial Cleanser: Soap and Water 1 x Per Week/30 Days Discharge Instructions: Gently cleanse wound with antibacterial soap, rinse and pat dry prior to dressing wounds Topical: Vitamin AandD Ointment, 4(oz) tube 1 x Per Week/30 Days Discharge Instructions: apply to red, broken down area Secondary Dressing: ABD Pad 5x9 (in/in) (Generic) 1 x Per Week/30 Days Discharge Instructions: Cover with ABD pad Secondary Dressing: Gauze 1 x Per Week/30 Days Discharge Instructions: As directed: dry, moistened with saline or moistened with Dakins Solution Secured With: Medipore T - 62M Medipore H Soft Cloth Surgical T ape ape, 2x2 (in/yd) (Generic) 1 x Per Week/30 Days Secured With: [employer] Sterile or Non-Sterile 6-ply 4.5x4 (yd/yd) (Generic) 1 x Per Week/30 Days Discharge Instructions: Apply Kerlix as directed Electronic Signature(s) Signed: 09/02/2022 12:57:19 PM By: Heather Ham MD Signed: 09/22/2022 4:35:23 PM By: [name] Previous Signature: 08/24/2022 3:59:15 PM Version By: Heather Ham MD Previous Signature: 08/24/2022 4:40:40 PM Version By: [name] Entered By:  [name] on 08/31/2022 16:39:46 -------------------------------------------------------------------------------- Problem List Details Patient Name: Date of Service: [name] 08/24/2022 10:30 [name]: [id number] Patient Account Number: [id number] Date of Birth/Sex: Treating RN: Mar 23, 1976 (46 y.o. [name] Primary Care Provider: Raelene [name] Other Clinician: Massie [name] Referring Provider: Treating Provider/Extender: Eldridge Dace, MICHA EL Baxter Kail in Treatment: [address] RAEDYN, KLINCK [name] ([id number]) [id number] Page 10 of 18 ICD-10 Encounter Code Description Active Date MDM Diagnosis L97.528 Non-pressure chronic ulcer of other part of left foot with  other specified 03/16/2022 No Yes severity L97.512 Non-pressure chronic ulcer of other part of right foot with fat layer exposed 03/16/2022 No Yes E11.621 Type 2 diabetes mellitus with foot ulcer 03/16/2022 No Yes E11.42 Type 2 diabetes mellitus with diabetic polyneuropathy 03/16/2022 No Yes L97.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 03/16/2022 No Yes of skin Inactive Problems Resolved Problems Electronic Signature(s) Signed: 08/24/2022 3:59:15 PM By: Heather Ham MD Entered By: [name] on 08/24/2022 11:55:00 -------------------------------------------------------------------------------- Progress Note Details Patient Name: Date of Service: [name] 08/24/2022 10:30 [name]: [id number] Patient Account Number: [id number] Date of Birth/Sex: Treating RN: [birth year] (46 y.o. [name] Primary Care Provider: Raelene [name] Other Clinician: Massie [name] Referring Provider: Treating Provider/Extender: RO BSO N, MICHA EL [name], [name] in Treatment: 23 Subjective History of Present Illness (HPI) 01/18/18-She is here for initial evaluation of the left great  toe ulcer. She is [name] poor historian in regards to timeframe in detail. She states approximately 4 [name] ago she lacerated her toe on something in the house. She followed up with her primary care who placed her on Bactrim and ultimately [name] second dose of Bactrim prior to coming to wound clinic. She states she has been treating the toe with peroxide, Betadine and [name] Band-Aid. She did not check her blood sugar this morning but checked it yesterday morning it was 327; she is unaware of [name] recent A1c and there are no current records. She saw Dr. she would've orthopedics last week for an old injury to the left ankle, she states he did not see her toe, nor did she bring it to his attention. She smokes approximately 1 pack cigarettes [name] day. Her social situation is concerning, she arrives this morning with her mother who appears extremely intoxicated/under the influence; her mother was asked to leave the room and be monitored by the patient's grandmother. The patient's aunt then accompanied the patient and the room throughout the rest of the appointment. We had [name] lengthy discussion regarding the deleterious effects of uncontrolled hyperglycemia and smoking as it relates to wound healing and overall health. She was strongly encouraged to decrease her smoking and get her diabetes under better control. She states she is currently on [name] diet and has cut down her [facility] consumption. The left toe is erythematous, macerated and slightly edematous with malodor present. The edema in her left foot is below her baseline, there is no erythema streaking. We will treat her with Santyl, doxycycline; we have ordered and xray, culture and provided [name] Peg assist surgical shoe and cultured the wound. 01/25/18-She is here in follow-up evaluation for [name] left great toe ulcer and presents with an abscess to her suprapubic area. She states her blood sugars remain elevated, feeling "sick" and if levels are below 250, but she is trying. She  has made no attempt to decrease her smoking stating that we "can't take away her food in her cigarettes". She has been compliant with offloading using the PEG assist you. She is using Santyl daily. the culture obtained last week grew staph aureus and Enterococcus faecalis; continues on the doxycycline and Augmentin was added on Monday. The suprapubic area has erythema, no femoral variation, purple discoloration, minimal induration, was accessed with [name] cotton tip applicator with sanguinopurulent drainage, this was cultured, I suspect the current antibiotic treatment will cover and we will not add anything to her current treatment plan. She was advised to  go to urgent care or ER with any change in [place], LILYANNAH [name] ([id number]) [id number] Page 11 of 18 redness, induration or fever. 02/01/18-She is here in follow-up evaluation for left great toe ulcers and [name] new abdominal abscess from last week. She was able to use packing until earlier this week, where she "forgot it was there". She states she was feeling ill with GI symptoms last week and was not taking her antibiotic. She states her glucose levels have been predominantly less than 200, with occasional levels between 200-250. She thinks this was contributing to her GI symptoms as they have resolved without intervention. There continues to be significant laceration to left toe, otherwise it clinically looks stable/improved. There is now less superficial opening to the lateral aspect of the great toe that was residual blister. We will transition to [facility] to all wounds, she will continue her Augmentin. If there is no change or deterioration next week for reculture. 02/08/18-She is here in follow-up evaluation for left great toe ulcer and abdominal ulcer. There is an improvement in both wounds. She has been wrapping her left toe with coban, not by our direction, which has created an area of discoloration to the medial  aspect; she has been advised to NOT use coban secondary to her neuropathy. She states her glucose levels have been high over this last week ranging from 200-350, she continues to smoke. She admits to being less compliant with her offloading shoe. We will continue with same treatment plan and she will follow-up next week. 02/15/18-She is here in follow-up evaluation for left great toe ulcer and abdominal ulcer. The abdominal ulcer is epithelialized. The left great toe ulcer is improved and all injury from last week using the Coban wrap is resolved, the lateral ulcer is healed. She admits to noncompliance with wearing offloading shoe and admits to glucose levels being greater than 300 most of the week. She continues to smoke and expresses no desire to quit. There is one area medially that probes deeper than it has historically, erythema to the toe and dorsal foot has consistently waxed and waned. There is no overt signs of cellulitis or infection but we will culture the wound for any occult infection given the new area of depth and erythema. We will hold off on sensitivities for initiation of antibiotic therapy. 02/22/18-She is here in follow up evaluation for left great toe ulcer. There is overall significant improvement in both wound appearance, erythema and edema with changes made last week. She was not initiated on antibiotic therapy. Culture obtained last week showed oxacillin sensitive staph aureus, sensitive to clindamycin. Clindamycin has been called into the pharmacy but she has been instructed to hold off on initiation secondary to overall clinical improvement and her history of antibiotic intolerance. She has been instructed to contact the clinic with any noted changes/deterioration and the wound, erythema, edema and/or pain. She will follow-up next week. She continues to smoke and her glucose levels remain elevated >250; she admits to compliance with offloading shoe 03/01/18 on evaluation today  patient appears to be doing fairly well in regard to her left first toe ulcer. She has been tolerating the dressing changes with the [facility] Dressing without complication and overall this has definitely showed signs of improvement according to records as well is what the patient tells me today. I'm very pleased in that regard. She is having no pain today 03/08/18 She is here for follow up evaluation of [name] left great toe ulcer. She remains  non-compliant with glucose control and smoking cessation; glucose levels consistently >200. She states that she got new shoe inserts/peg assist. She admits to compliance with offloading. Since my last evaluation there is significant improvement. We will switch to prisma at this time and she will follow up next week. She is noted to be tachycardic at this appointment, heart rate 120s; she has [name] history of heart rate 70-130 according to our records. She admits to extreme agitation r/t personal issues; she was advised to monitor her heartrate and contact her physician if it does not return to [name] more normal range (<100). She takes cardizem twice daily. 03/15/18-She is here in follow-up evaluation for left great toe ulcer. She remains noncompliant with glucose control and smoking cessation. She admits to compliance with wearing offloading shoe. The ulcer is improved/stable and we will continue with the same treatment plan and she will follow-up next week 03/22/18-She is here for evaluation for left great toe ulcer. There continues to be significant improvement despite recurrent hyperglycemia (over 500 yesterday) and she continues to smoke. She has been compliant with offloading and we will continue with same treatment plan and she will follow-up next week. 03/29/18-She is here for evaluation for left great toe ulcer. Despite continuing to smoke and uncontrolled diabetes she continues to improve. She is compliant with offloading shoe. We will continue with the same  treatment plan and she will follow-up next week 04/05/18- She is here in follow up evaluation for [name] left great toe ulcer; she presents with small pustule to left fifth toe (resembles ant bite). She admits to compliance with wearing offloading shoe; continues to smoke or have uncontrolled blood glucose control. There is more callus than usual with evidence of bleeding; she denies known trauma. 04/12/18-She is here for evaluation of left great toe ulcer. Despite noncompliance with glycemic control and smoking she continues to make improvement. She continues to wear offloading shoe. The pustule, that was identified last week, to the left fifth toe is resolved. She will follow-up in 2 [name] 05/03/18-she is seen in follow-up evaluation for [name] left great toe ulcer. She is compliant with offloading, otherwise noncompliant with glycemic control and smoking. She has plateaued and there is minimal improvement noted. We will transition to [facility], replaced the insert to her surgical shoe and she will follow-up in one week 05/10/18- She is here in follow up evaluation for [name] left great toe ulcer. It appears stable despite measurement change. We will continue with same treatment plan and follow up next week. 05/24/18-She is seen in follow-up evaluation for [name] left great toe ulcer. She remains compliant with offloading, has made significant improvement in her diet, decreasing the amount of sugar/soda. She said her recent A1c was 10.9 which is lower than. She did see [name] diabetic nutritionist/educator yesterday. She continues to smoke. We will continue with the same treatment plan and she'll follow-up next week. 05/31/18- She is seen in follow-up evaluation for left great toe ulcer. She continues to remain compliant with offloading, continues to make improvement in her diet, increasing her water and decreasing the amount of sugar/soda. She does continue to smoke with no desire to quit. We will apply Prisma to the depth  and Hydrofera Blue over. We have not received insurance authorization for oasis. She will follow up next week. 06/07/18-She is seen in follow-up evaluation for left great toe ulcer. It has stalled according to today's measurements although base appears stable. She says she saw [name] diabetic educator yesterday; her  average blood sugars are less than 300 which is an improvement for her. She continues to smoke and states "that's my next step" She continues with water over soda. We will order for xray, culture and reinstate ace wrap compression prior to placing apligraf for next week. She is voicing no complaints or concerns. Her dressing will change to iodoflex over the next week in preparation for apligraf. 06/14/18-She is seen in follow-up evaluation for left great toe ulcer. Plain film x-ray performed last week was negative for osteomyelitis. Wound culture obtained last week grew strep B and OSSA; she is initiated on keflex and cefdinir today; there is erythema to the toe which could be from ace wrap compression, she has [name] history of wrapping too tight and has has been encouraged to maintain ace wraps that we place today. We will hold off on application of apligraf today, will apply next week after antibiotic therapy has been initiated. She admits today that she has resumed taking [name] shower with her foot/toe submerged in water, she has been reminded to keep foot/toe out of the bath water. She will be seen in follow up next week 06/21/18-she is seen in follow-up evaluation for left great toe ulcer. She is tolerating antibiotic therapy with no GI disturbance. The wound is stable. Apligraf was applied today. She has been decreasing her smoking, only had 4 cigarettes yesterday and 1 today. She continues being more compliant in diabetic diet. She will follow-up next week for evaluation of site, if stable will remove at 2 [name]. 06/28/18- She is here in follow up evalution. Apligraf was placed last week, she states  the dressing fell off on Tuesday and she was dressing with hydrofera blue. She is healed and will be discharged from the clinic today. She has been instructed to continue with smoking cessation, continue monitoring glucose levels, offloading for an additional 4 [name] and continue with hydrofera blue for additional two [name] for any possible microscopic opening. Readmission: 08/07/18 on evaluation today patient presents for reevaluation concerning the ulcer of her right great toe. She was previously discharged on 06/28/18 healed. Nonetheless she states that this began to show signs of drainage she subsequently went to her primary care provider. Subsequently an x-ray was performed on 08/01/18 which was negative. The patient was also placed on antibiotics at that time. Fortunately they should have been effective for the infection. Nonetheless she's been experiencing some improvement but still has [name] lot of drainage coming from the wound itself. 08/14/18 on evaluation today patient's wound actually does show signs of improvement in regard to the erythema at this point. She has completed the antibiotics. With that being said we did discuss the possibility of placing her in [name] total contact cast as of today although I think that I may want to give this just [name] little bit more time to ensure nothing recurrence as far as her infection is concerned. I do not want to put in the cast and risk infection at that time if things are not completely resolved. With that being said she is gonna require some debridement today. 08/21/18 on evaluation today patient actually appears to be doing okay in regard to her toe ulcer. She's been tolerating the dressing changes without complication. With that being said it does appear that she is ready and in fact I think it's appropriate for [place] to go ahead and initiate the total contact cast today. Nonetheless she will require some sharp debridement to prepare the wound for application.  Overall  I feel like things have been progressing well but we do need to do something to get this to close more readily. 08/24/18 patient seen today for reevaluation after having had the total contact cast applied on Tuesday. She seems to have done very well the wound appears to be doing great and overall I'm pleased with the progress that she's made. There were no abnormal areas of rubbing from the cast on her lower extremity. 08/30/18 on evaluation today patient actually appears to be completely healed in regard to her plantar toe ulcer. She tells me at this point she's been having [name] lot of issues with the cast. She almost fell [name] couple of times the state shall the step of her dog [name] couple times as well. This is been [name] very frustrating process for JENAYA, SAAR ([id number]) [id number] Page 12 of 18 her other nonetheless she has completely healed the wound which is excellent news. Overall there does not appear to be the evidence of infection at this time which is great news. 09/11/18 evaluation today patient presents for follow-up concerning her great toe ulcer on the left which has unfortunately reopened since I last saw her which was only [name] couple of [name] ago. Unfortunately she was not able to get in to get the shoe and potentially the AFO that's gonna be necessary due to her left foot drop. She continues with offloading shoe but this is not enough to prevent her from reopening it appears. When we last had her in the total contact cast she did well from [name] healing standpoint but unfortunately the wound reopened as soon as she came out of the cast within just [name] couple of [name]. 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 This is also part of the reason why she falls on [name] regular  basis. I just do not believe that is gonna be safe for [place] to reinitiate the total contact cast as last time we had this on she fell 3 times one week which is definitely not normal for her. 09/18/18 upon evaluation today the patient actually appears to be doing about the same in regard to her toe ulcer. She did not contact [facility] as I asked her to even though I had given her the prescription. In fact she actually states that she has no idea where the prescription is. She did apparently call [facility] and they told her that all she needed to do was bring the prescription in order to be able to be seen and work on getting the AFO for her left foot. With all that being said she still does not have an appointment and I'm not sure were things stand that regard. I will give her [name] new prescription today in order to contact them to get this set up. 09/25/18 on evaluation today patient actually appears to be doing about the same in regard to her toes ulcer. She does have [name] small areas which seems to have [name] lot of callous buildup around the edge of the wound which is going to need sharp debridement today. She still is waiting to be scheduled for evaluation with [facility] for possibility of an AFO. She states there supposed to call her tomorrow to get this set up. Unfortunately it does appear that her  foot specifically the toe area is showing signs of erythema. There does not appear to be any systemic infection which is in these good news. 10/02/18 on evaluation today patient actually appears to be doing about the same in regard to her toe ulcer. This really has not done too well although it's not significantly larger it's also not significantly smaller. She has been tolerating the dressing changes without complication. She actually has her appointment with [facility] and [place] tomorrow to hopefully be measured for obtaining and AFO splint. I think this would be helpful preventing this from reoccurring. We  had contemplated starting the cast this week although to be honest I am reluctant to do that as she's been having nausea, vomiting, and seizure activity over the past three days. She has [name] history of seizures and have been told is nothing that can be done for these. With that being said I do believe that along with the seizures have the nausea vomiting which upon further questioning doesn't seem to be the normal for her and makes me concerned for the possibility of infection or something else going on. I discussed this with the patient and her mother during the office visit today. I do not feel the wound is effective but maybe something else. The responses this was "this just happens to her at times and we don't know why". They did not seem to be interested in going to the hospital to have this checked out further. 10/09/18 on evaluation today patient presents for follow-up concerning her ongoing toe ulcer. She has been tolerating the dressing changes without complication. Fortunately there does not appear to be any evidence of infection which is great news however I do think that the patient would benefit from going ahead for with the total contact cast. She's actually in [name] wheelchair today she tells me that she will use her walker if we initiate the cast. I was very specific about the fact that if we were gonna do the cast I wanted to make sure that she was using the walker in order to prevent any falls. She tells me she does not have stairs that she has to traverse on [name] regular basis at her home. She has not had any seizures since last week again that something that happens to her often she tells me she did talk to [employer] and they said that it may take up to three [name] to get the brace approved for her. Hopefully that will not take that long but nonetheless in the meantime I do think the cast could be of benefit. 10/12/18 on evaluation today patient appears to be doing rather well in regard to her  toe ulcer. It's just been [name] few days and already this is significantly improved both as far as overall appearance and size. Fortunately there's no sign of infection. She is here for her first obligatory cast change. 10/19/18 Seen today for follow up and management of left great toe ulcer. Wound continues to show improvement. Noted small open area with seroussang drainage with palpation. Denies any increased pain or recent fevers during visit. She will continue calcium alginate with offloading shoe. Denies any questions or concerns during visit. 10/26/18 on evaluation today patient appears to be doing about the same as when I last saw her in regard to her wound bed. Fortunately there does not appear to be any signs of infection. Unfortunately she continues to have [name] breakdown in regard to the toe region any time that she is  not in the cast. It takes almost no time at all for this to happen. Nonetheless she still has not heard anything from the brace being made by [employer] as to when exactly this will be available to her. Fortunately there is no signs of infection at this time. 10/30/18 on evaluation today patient presents for application of the total contact cast as we just received him this morning. Fortunately we are gonna be able to apply this to her today which is great news. She continues to have no significant pain which is good news. Overall I do feel like things have been improving while she was the cast is when she doesn't have [name] cast that things get worse. She still has not really heard anything from [place] regarding her brace. 11/02/18 upon evaluation today patient's wound already appears to be doing significantly better which is good news. Fortunately there does not appear to be any signs of infection also good news. Overall I do think the total contact cast as before is helping to heal this area unfortunately it's just not gonna likely keep the area closed and healed without her getting her  brace at least. Again the foot drop is [name] significant issue for her. 11/09/18 on evaluation today patient appears to be doing excellent in regard to her toe ulcer which in fact is completely healed. Fortunately we finally got the situation squared away with the paperwork which was needed to proceed with getting her brace approved by Medicaid. I have filled that out unfortunately that information has been sent to the orthopedic office that I worked at 2 1/2 years ago and not tired Current wound care measures. Fortunately she seems to be doing very well at this time. 11/23/18 on evaluation today patient appears to be doing More Poorly Compared to Last Time I Saw Her. At [facility] She Had Completely Healed. Currently she is continuing to have issues with reopening. She states that she just found out that the brace was approved through Medicaid now she just has to go get measured in order to have this fitted for her and then made. Subsequently she does not have an appointment for this yet that is going to complicate things we obviously cannot put her back in the cast if we do not have everything measured because they're not gonna be able to measure her foot while she is in the cast. 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 Obviously this is unfortunate and does have me somewhat worried as well. 11/30/18 on evaluation today patient's toe ulcer actually appears to be doing fairly well. The good news is she will be getting her brace in the shoes next week on Wednesday. Hopefully we will be able to get this to heal without having to go back in the cast however she may need the cast in order to get the wound completely heal and then go from there. Fortunately there's no signs of infection at this time. 12/07/18 on evaluation today patient fortunately did receive her brace and she states she could tell this  definitely makes her walk better. With that being said she's been having issues with her toe where she noticed yesterday there was [name] lot of tissue that was loosing off this appears to be much larger than what it was previous. She also states that her leg has been read putting much across the top  of her foot just about the ankle although this seems to be receiving somewhat. The total area is still red and appears to be someone infected as best I can tell. She is previously taken Bactrim and that may be [name] good option for her today as well. We are gonna see what I wound culture shows as well and I think that this is definitely appropriate. With that being said outside of the culture I still need to initiate something in the interim and that's what I'm gonna go ahead and select Bactrim is [name] good option for her. 12/14/18 on evaluation today patient appears to be doing better in regard to her left great toe ulcer as compared to last week's evaluation. There's still some erythema although this is significantly improved which is excellent news. Overall I do believe that she is making good progress is still gonna take some time before she is where I would like her to be from the standpoint of being able to place her back into the total contact cast. Hopefully we will be where we need to be by next week. 12/21/18 on evaluation today patient actually appears to be doing poorly in regard to her toe ulcer. She's been tolerating the dressing changes without complication. Fortunately there's no signs of systemic infection although she does have [name] lot of drainage from the toe ulcer and this does seem to be causing some issues at this point. She does have erythema on the distal portion of her toe that appears to be likely cellulitis. JALEIGHA, DEANE [name] ([id number]) [id number] Page 13 of 18 12/28/18 on evaluation today patient actually appears to be doing [name] little better in my pinion in regard to  her toe ulcer. With that being said she still does have some evidence of infection at this time and for her culture she had both E. coli as well as enterococcus as organisms noted on evaluation. For that reason I think that though the Keflex likely has treated the E. coli rather well this has really done nothing for the enterococcus. We are going to have to initiate treatment for this specifically. 01/04/19 on evaluation today patient's toe actually appears to be doing better from the standpoint of infection. She currently would like to see about putting the cash back on I think that this is appropriate as long as she takes care of it and keeps it from getting wet. She is gonna have some drainage we can definitely pass this up with Drawtex and alginate to try to prevent as much drainage as possible from causing the problems. With that being said I do want to at least try her with the cast between now and Tuesday. If there any issues we can't continue to use it then I will discontinue the use of the cast at that point. 01/08/19 on evaluation today patient actually appears to be doing very well as far as her foot ulcer specifically the great toe on the left is concerned. She did have an area of rubbing on the medial aspect of her left ankle which again is from the cast. Fortunately there's no signs of infection at this point in this appears to be [name] very slight skin breakdown. The patient tells me she felt it rubbing but didn't think it was that bad. Fortunately there is no signs of active infection at this time which is good news. No fevers, chills, nausea, or vomiting noted at this time. 01/15/19 on evaluation today patient actually appears to be doing  well in regard to her toe ulcer. Again as previous she seems to do well and she has the cast on which indicates to me that during the time she doesn't have [name] cast on she's putting way too much pressure on this region. Obviously I think that's gonna be  an issue as with the current national emergency concerning the Covid-19 Virus it has been recommended that we discontinue the use of total contact casting by the chief medical officer of our company, Dr. Simona Huh. The reasoning is that if [name] patient becomes sick and cannot come into have the cast removed they could not just leave this on for an additional two [name]. Obviously the hospitals also do not want to receive patient's who are sick into the emergency department to potentially contaminate the region and spread the Covid-19 Virus among other sick individuals within the hospital system. Therefore at this point we are suspending the use of total contact cast until the current emergency subsides. This was all discussed with the patient today as well. 01/22/19 on evaluation today patient's wound on her left great toe appears to be doing slightly worse than previously noted last week. She tells me that she has been on this quite [name] bit in fact she tells me she's been awake for 38 straight hours. This is due to the fact that she's having to care for grandparents because nobody else will. She has been taking care of them for five the last seven days since I've seen her they both have dementia his is from [name] stroke and her grandmother's was progressive. Nonetheless she states even her mom who knows her condition and situation has only help two of those days to take care of them she's been taking care of the rest. Fortunately there does not appear to be any signs of active infection in regard to her toe at this point although obviously it doesn't look as good as it did previous. I think this is directly related to her not taking off the pressure and friction by way of taking things easy. Though I completely understand what's going on. 01/29/19 on evaluation today patient's tools are actually appears to be showing some signs of improvement today compared to last week's evaluation as far as not necessarily the  overall size of the wound but the fact that she has some new skin growth in between the two ends of the wound opening. Overall I feel like she has done well she states that she had [name] family member give her what sounds to be [name] CAM walker boot which has been helpful as well. 02/05/19 on evaluation today patient's wound bed actually appears to be doing significantly better in regard to her overall appearance of the size of the wound. With that being said she is still having an issue with offloading efficiently enough to get this to close. Apparently there is some signs of infection at this point as well unfortunately. Previously she's done well of Augmentin I really do not see anything that needs to be culture currently but there theme and cellulitis of the foot that I'm seeing I'm gonna go ahead and place her on an antibiotic today to try to help clear this up. 02/12/2019 on evaluation today patient actually appears to be doing poorly in regard to her overall wound status. She tells me she has been using her offloading shoe but actually comes in today wearing her tennis shoe with the AFO brace. Again as I previously discussed with her  this is really not sufficient to allow the area to heal appropriately. Nonetheless she continues to be somewhat noncompliant and I do wonder based on what she has told my nurse in the past as to whether or not [name] good portion of this noncompliance may be recreational drug and alcohol related. She has had [name] history of heroin overdose and this was fairly recently in the past couple of months that have been seeing her. Nonetheless overall I feel like her wound looks significantly worse today compared to what it was previous. She still has significant erythema despite the Augmentin I am not sure that this is an appropriate medication for her infection I am also concerned that the infection may have gone down into her bone. 02/19/19 on evaluation today patient actually appears to be  doing about the same in regard to her toe ulcer. Unfortunately she continues to show signs of bone exposure and infection at this point. There does not appear to be any evidence of worsening of the infection but I'm also not really sure that it's getting significantly better. She is on the Augmentin which should be sufficient for the Staphylococcus aureus infection that she has at this point. With that being said she may need IV antibiotics to more appropriately treat this. We did have [name] discussion today about hyperbaric option therapy. 02/28/19 on evaluation today patient actually appears to be doing much worse in regard to the wound on her left great toe as compared to even my previous evaluation last week. Unfortunately this seems to be training in [name] pretty poor direction. Her toe was actually now starting to angle laterally and I can actually see the entire joint area of the proximal portion of the digit where is the distal portion of the digit again is no longer even in contact with the joint line. Unfortunately there's [name] lot more necrotic tissue around the edge and the toe appears to be showing signs of becoming gangrenous in my pinion. I'm very concerned about were things stand at this point. She did see infectious disease and they are planning to send in [name] prescription for Sivextro for her and apparently this has been approved. With that being said I don't think she should avoid taking this but at the same time I'm not sure that it's gonna be sufficient to save her toe at this point. She tells me that she still having to care for grandparents which I think is putting quite [name] bit of strain on her foot and specifically the total area and has caused this to break down even to [name] greater degree than would've otherwise been expected. 03/05/19 on evaluation today patient actually appears to be doing quite well in regard to her toe all things considering. She still has bone exposed but there appears to be  much less your thing on overall the appearance of the wound and the toe itself is dramatically improved. She still does have some issues currently obviously with infection she did see vascular as well and there concerned that her blood flow to the toad. For that reason they are setting up for an angiogram next week. 03/14/19 on evaluation today patient appears to be doing very poor in regard to her toe and specifically in regard to the ulceration and the fact that she's starting to notice the toe was leaning even more towards the lateral aspect and the complete joint is visible on the proximal aspect of the joint. Nonetheless she's also noted [name] significant odor  and the tip of the toe is turning more dark and necrotic appearing. Overall I think she is getting worse not better as far as this is concerned. For that reason I am recommending at this point that she likely needs to be seen for likely amputation. READMISSION 03/19/2021 This is [name] patient that we cared for in this clinic for [name] prolonged period of time in 2019 and 2020 with [name] left foot and left first toe wound. 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 Luana Shu. In December 2021 she had an ulcer on her right great toe as well as the fourth and fifth toes. 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 She also had an angiogram at that time and underwent angioplasty of the right anterior tibial artery. In any case she claims that the wound on the right foot is closed I did not look at this today which was probably an oversight although I think that should be done next week. 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 According to Dr. Deborra Medina last review that she was out of the area being cared for by another physician but recently came back to his attention. 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 [name] culture of this area showed E. coli apparently before she came back to see Dr. Luana Shu she was supposed to be receiving antibiotics but she did not really take them. Nor is she offloading this area at all. Finally her last hemoglobin A1c listed in epic was in March 2022 at 14.1 she says things are [name] lot better since then although I am not sure. 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 She had this wound at the time. During this admission she had E. coli sepsis [name] TEE was negative. 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 She is still [name] heavy cigarette smoker. She has [name] history of bipolar disorder seizure disorder [name], [name] ([id number]) [id number] Page 14 of 18 ABI in our clinic was 1.05 6/1; left midfoot in the setting of [name] TMA done previously. Round circular wound with [name] "knuckle" of protruding tissue. The problem is that the knuckle was not attached to any of the surrounding granulation and this probed proximally widely I removed [name] large portion of this tissue. This wound goes with considerable undermining laterally. I do not feel any bone there was no purulence but this is [name] deep wound. 6/8; in spite of the debridement I did last week. She arrives with [name] wound looking exactly the same. [name] protruding "knuckle" of tissue nonadherent to most of the surrounding tissue. There is considerable depth around this from 6-12 o'clock at 2.7 cm and undermining of 1 cm. This does not look overtly infected and the x- ray I did last week was negative for any osseous abnormalities. We have  been using silver collagen 6/15; deep tissue culture I did last week showed moderate staph aureus and moderate Pseudomonas. This will definitely require prolonged antibiotic therapy. The pathology on the protuberant area was negative  for malignancy fungus etc. 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 We have been using silver collagen. I am going to be prescribing Levaquin for 2 [name]. Her CT scan of the foot is down for 7/5 6/22; CT scan of the foot on 7 5. She says she has hardware in the left leg from her previous fracture. She is on the Levaquin for the deep tissue culture I did that showed methicillin sensitive staph aureus and Pseudomonas. I gave her [name] 2-week supply and she will have another week. She arrives in clinic today with the same protuberant tissue however this is nonadherent to the tissue surrounding it. I am really at [name] loss to explain this unless there is underlying deep tissue infection 6/29; patient presents for 1 week follow-up. She has been using collagen to the wound bed. She reports taking her antibiotics as prescribed.She has no complaints or issues today. She denies signs of infection. 7/6; patient presents for one week followup. She has been using collagen to the wound bed. She states she is taking Levaquin however at times she is not able to keep it down. She denies signs of infection. 7/13; patient presents for 1 week follow-up. She has been using silver alginate to the wound bed. She still has nausea when taking her antibiotics. She denies signs of infection. 7/20; patient presents for 1 week follow-up. She has been using silver alginate with gentamicin cream to the wound bed. She denies any issues and has no complaints today. She denies signs of infection. 7/27; patient presents for 1 week follow-up. She continues to use silver alginate with gentamicin cream to the wound bed. She reports starting her  antibiotics. She has no issues or complaints. Overall she reports stability to the wound. 8/3; patient presents for 1 week follow-up. She has been using silver alginate with gentamicin cream to the wound bed. She reports completing all antibiotics. She has no issues or complaints today. She denies signs of infection. 8/17; patient presents for 2-week follow-up. He is to use silver alginate to the wound bed. She has no issues or complaints today. She denies signs of infection. She reports her pain has improved in her foot since last clinic visit 8/24; patient presents for 1 week follow-up. She continues to use silver alginate to the wound bed. She has no issues or complaints. She denies signs of infection. Pain is stable. 9/7; patient presents for follow-up. She missed her last week appointment due to feeling ill. She continues to use silver alginate. She has [name] new wound to the right lower extremity that is covered in eschar. She states It occurred over the past week and has no idea how it started. She currently denies signs of infection. 9/14; patient presents for follow-up. T the left foot wound she has been using gentamicin cream and silver alginate. T the right lower extremity wound she has o o been keeping this covered and has not obtain Santyl. 9/21; patient presents for follow-up. She reports using gentamicin cream and silver alginate to the left foot and Santyl to the right lower extremity wound. She has no issues or complaints today. She denies signs of infection. 9/28; patient presents for follow-up. She reports [name] new wound to her right heel. She states this occurred [name] few days ago and is progressively gotten worse. She has been trying to clean the area with [name] Q-tip and Santyl. She reports stability in the other 2 wounds. She has been  using gentamicin cream and silver alginate to the left foot and Santyl to the right lower extremity wound. 10/12; patient presents for follow-up. She  reports improvement to the wound beds. She is seeing vein and vascular to discuss the potential of [name] left BKA. She states they are going to do an arteriogram. She continues to use silver alginate with dressing changes to her wounds. 11/2; patient presents for follow-up. She states she has not been doing dressing changes to the wound beds. She states she is not able to offload the areas. She reports chronic pain to her left foot wound. 11/9; patient presents for follow-up. She came in with only socks on. She states she forgot to put on shoes. It is unclear if she is doing any dressing changes. She currently denies systemic signs of infection. 11/16; patient presents for follow-up. She came again only with socks on. She states she does not wear shoes ever. It is unclear if she does dressing changes. She currently denies systemic signs of infection. 11/23; patient presents for follow-up. She wore her shoes today. It still unclear exactly what dressing she is using for each wound but she did states she obtained Dakin's solution and has been using this to the left foot wound. She currently denies signs of infection. 11/30; patient presents for follow-up. She has no issues or complaints today. She currently denies signs of infection. 12/7; patient presents for follow-up. She has no issues or complaints today. She has been using Hydrofera Blue to the right heel wound and Dakin solution to the left foot wound. Her right anterior leg wound is healed. She currently denies signs of infection. 12/14; patient presents for follow-up. She has been using Hydrofera Blue to the right heel and Dakin's to the left foot wounds. She has no issues or complaints today. She denies signs of infection. 12/21; patient presents for follow-up. She reports using Hydrofera Blue to the right heel and Dakin's to the left foot wound. She denies signs of infection. 12/28; patient presents for follow-up. She continues to use Dakin's to  the left foot wound and Hydrofera Blue to the right heel wound. She denies signs of infection. 1/4; patient presents for follow-up. She has no issues or complaints today. She denies signs of infection. 1/11; patient presents for follow-up. It is unclear if she has been dressing these wounds over the past week. She currently denies signs of infection. 1/18; patient presents for follow-up. She states she has been using Dakin's wet-to-dry dressings to the left foot. She has been using Hydrofera Blue to the right foot foot wound. She states that the anterior right leg wound has reopened and draining serous fluid. She denies signs of infection. 1/25; patient presents for follow-up. She has no issues or complaints today. 2/1; patient presents for follow-up. She has no issues or complaints today. She denies signs of infection. 2/8; patient presents for follow-up. She has lost her surgical shoes. She did not have [name] dressing to the right heel wound. She currently denies signs of infection. 2/15; patient presents for follow-up. She reports more pain to the right heel today. She denies purulent drainage Or fever/chills 2/22; patient presents for follow-up. She reports taking clindamycin over the past week. She states that she continues to have pain to her right heel. She reports purulent drainage. Readmission 03/16/2022 Ms. Rotha Cassels is [name] 46 year old female with [name] past medical history of type 2 diabetes, osteomyelitis to her feet, chronic systolic heart failure and bipolar Gropp, Tyjae [name] (  [id number]) [id number] Page 15 of 18 disorder that presents to the clinic for bilateral feet wounds and right lower extremity wound. She was last seen in our clinic on 12/15/2021. At that time she had purulent drainage coming out of her right plantar foot and I recommended she go to the ED. She states she went to [facility] and has been there for the past 3 months. I cannot see  the records. She states she had OR debridement and was on several [name] of IV antibiotics while inpatient. Since discharge she has not been taking care of the wound beds. She had nothing on her feet other than socks today. She currently denies signs of infection. 5/31; patient presents for follow-up. She has been using Dakin's wet-to-dry dressings to the wound beds on her feet bilaterally and antibiotic ointment to the right anterior leg wound. She had [name] wound culture done at last clinic visit that showed moderate Pseudomonas aeruginosa sensitive to ciprofloxacin. She currently denies systemic signs of infection. 6/14; patient presents for follow-up. She received Keystone 5 days ago and has been using this on the wound beds. She states that last week she had to go to the hospital because she had increased warmth and erythema to the right foot. She was started on 2 oral antibiotics. She states she has been taking these. She currently denies systemic signs of infection. She has no issues or complaints today. 6/21; patient presents for follow-up. She states she has been using Keystone antibiotics to the wound beds. She has no issues or complaints today. She denies signs of infection. 6/28; patient presents for follow-up. She has been using Keystone antibiotics to the wound beds. She has no issues or complaints today. 7/12; patient presents for follow-up. Has been using Keystone antibiotics to the wound beds with calcium alginate. She has no issues or complaints today. She never followed up with her orthopedic surgeon who did the OR debridement to the right foot. We discussed the total contact cast for the left foot and patient would like to do this next week. 7/19; patient presents for follow-up. She has been using Keystone antibiotics with calcium alginate to the wound beds. She has no issues or complaints today. Patient is in agreement to do the total contact cast of the left foot today. She knows to  return later this week for the obligatory cast change. 05-13-2022 upon evaluation today patient's wound which she has the cast of the left leg actually appears to be doing significantly better. Fortunately I do not see any signs of active infection locally or systemically which is great news and overall I am extremely pleased with where we stand currently. 7/26; patient presents for follow-up. She has [name] cast in place for the past week. She states it irritated her shin. Other than that she tolerated the cast well. She states she would like [name] break for 1 week from the cast. We have been using Keystone antibiotic and Aquacel to both wound beds. She denies signs of infection. 8/2; patient presents for follow-up. She has been using Keystone and Aquacel to the wound beds. She denies any issues and has no complaints. She is agreeable to have the cast placed today for the left leg. 06-03-2022 upon evaluation today patient appears to be doing well with regard to her wound she saw some signs of improvement which is great news. Fortunately I do not see any evidence of active infection locally or systemically at this time which is great news. No  fevers, chills, nausea, vomiting, or diarrhea. 8/16; patient presents for follow-up. She has no issues or complaints today. We have been using Keystone and Aquacel to the wound beds. The left lower extremity is in [name] total contact cast. She is tolerated this well. 8/23; patient presents for follow-up. She has had the total contact cast on the left leg for the past week. Unfortunately this has rubbed and broken down the skin to the medial foot. She currently denies signs of infection. She has been using Keystone antibiotic to the right plantar foot wound. 8/30; patient presents for follow-up. We have held off on the total contact cast for the left leg for the past week. Her wound on the left foot has improved and the previous surrounding breakdown of skin has epithelialized.  She has been using Keystone antibiotic to both wound beds. She has no issues or complaints today. She denies signs of infection. 9/6; patient presents for follow-up. She has ordered her's Keystone antibiotic and this is arriving this week. She has been doing Dakin's wet-to-dry dressings to the wound beds. She denies signs of infection. She is agreeable to the total contact cast today. 9/13; patient presents for follow-up. She states that the cast caused her left leg shin to be sore. She would like to take [name] break from the cast this week. She has been using Keystone antibiotic to the right plantar foot wound. She denies signs of infection. 9/20; patient presents for follow-up. She has been using Keystone antibiotics to the wound beds with calcium alginate to the right foot wound and Hydrofera Blue to the left foot wound. She is agreeable to having the cast placed today. She has been approved for Apligraf and we will order this for next clinic visit. 9/27; patient presents for follow-up. We have been using Keystone antibiotic with Hydrofera Blue to the left foot wound under [name] total contact cast. T the right o foot wound she has been using Keystone antibiotic and calcium alginate. She declines [name] total contact cast today. Apligraf is available for placement and she would like to proceed with this. 07-28-2022 upon evaluation today patient appears to be doing well currently in regard to her wound. She is actually showing signs of significant improvement which is great news. Fortunately I do not see any evidence of active infection locally nor systemically at this time. She has been seeing Dr. Heber Cascade and to be honest has been doing very well with the cast. Subsequently she comes in today with [name] cast on and we did reapply that today as well. She did not really want to she try to talk me out of that but I explained that if she wanted to heal this is really the right way to go. Patient voiced understanding. In  regard to her right foot this is actually [name] lot better compared to the last time I saw her which is also great news. 10/11; patient presents for follow-up. Apligraf and the total contact cast was placed to the left leg at last clinic visit. She states that her right foot wound had burning pain to it with the placement of Apligraf to this area. She has been doing [place] over this area. She denies signs of infection including increased warmth, erythema or purulent drainage. 11/1; 3-week follow-up. The patient fortunately did not have [name] total contact cast or an Apligraf and on the left foot. She has been using Keystone ABD pads and kerlix and her own running shoes She arrives in clinic today  with thick callus and [name] very poor surface on the left foot on the right nonviable skin subcutaneous tissue and [name] deep probing hole. Objective Constitutional Patient is hypertensive.. Pulse regular and within target range for patient.Marland Kitchen Respirations regular, non-labored and within target range.. Temperature is normal and within the target range for the patient.Marland Kitchen appears in no distress. [name], [name] ([id number]) [id number] Page 16 of 18 Vitals Time Taken: 11:10 AM, Height: 69 in, Weight: 178 lbs, BMI: 26.3, Temperature: 98.1 F, Pulse: 67 bpm, Respiratory Rate: 16 breaths/min, Blood Pressure: 148/98 mmHg. General Notes: 1. We continued the [facility] with silver alginate. The patient is changing the dressing herself 2. Had [name] fairly long discussion about offloading these wounds made it clear that we are not going to heal these without changing what is going on with regards to pressure on the wounds. The patient would not make [name] decision. 3. The area on the right heel has about 1.3 cm of depth but no palpable bone. The depth of this is worrisome additional imaging studies may be necessary. Integumentary (Hair, Skin) Wound #11 status is Open. Original cause of wound was Surgical  Injury. The date acquired was: 12/01/2021. The wound has been in treatment 23 [name]. The wound is located on the [place]. The wound measures 0.6cm length x 0.6cm width x 1.1cm depth; 0.283cm^2 area and 0.311cm^3 volume. There is Fat Layer (Subcutaneous Tissue) exposed. There is [name] medium amount of serosanguineous drainage noted. The wound margin is distinct with the outline attached to the wound base. There is medium (34-66%) pink, pale granulation within the wound bed. There is [name] medium (34-66%) amount of necrotic tissue within the wound bed. Wound #12 status is Open. Original cause of wound was Pressure Injury. The date acquired was: 03/16/2020. The wound has been in treatment 23 [name]. The wound is located on the [place]. The wound measures 2cm length x 0.9cm width x 0.2cm depth; 1.414cm^2 area and 0.283cm^3 volume. There is Fat Layer (Subcutaneous Tissue) exposed. There is [name] medium amount of serous drainage noted. The wound margin is flat and intact. There is large (67- 100%) red, pink granulation within the wound bed. There is no necrotic tissue within the wound bed. 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 There was [name] Excisional Skin/Subcutaneous Tissue Debridement with [name] total area of 1 sq cm performed by [name], MD. With the following instrument(s): Curette to remove Viable and Non-Viable tissue/material. Material removed includes Callus and Subcutaneous Tissue and. [name] time out was conducted at 11:37,  prior to the start of the procedure. [name] Minimum amount of bleeding was controlled with Pressure. The procedure was tolerated well. Post Debridement Measurements: 1cm length x 1cm width x 1.2cm depth; 0.942cm^3 volume. Character of Wound/Ulcer Post Debridement is stable. Post procedure Diagnosis Wound #11: Same as Pre-Procedure Wound #12 Pre-procedure diagnosis of Wound #12 is [name] Diabetic Wound/Ulcer of the Lower Extremity located on the Left,Medial,Plantar Foot .Severity of Tissue Pre Debridement is: Fat layer exposed. There was [name] Excisional Skin/Subcutaneous Tissue Debridement with [name] total area of 12 sq cm  performed by [name], MD. With the following instrument(s): Curette to remove Viable and Non-Viable tissue/material. Material removed includes Callus, Subcutaneous Tissue, and Slough. [name] time out was conducted at 11:34, prior to the start of the procedure. [name] Minimum amount of bleeding was controlled with Pressure. The procedure was tolerated well. Post Debridement Measurements: 2cm length x 1cm width x 0.2cm depth; 0.314cm^3 volume. Character of Wound/Ulcer Post Debridement is stable. Severity of Tissue Post Debridement is: Fat layer exposed. Post procedure Diagnosis Wound #12: Same as Pre-Procedure Plan Follow-up Appointments: Return Appointment in 1 week. Nurse Visit as needed Bathing/ Shower/ Hygiene: May shower with wound dressing protected with water repellent cover or cast protector. No tub bath. Anesthetic (Use 'Patient Medications' Section for Anesthetic Order Entry): Lidocaine applied to wound bed Edema Control - Lymphedema / Segmental Compressive Device / Other: Elevate, Exercise Daily and Avoid Standing for Long Periods of Time. Elevate legs to the level of the heart and pump ankles as often as possible Elevate leg(s) parallel to the floor when sitting. Off-Loading: T Contact Cast to Left Lower Extremity - hold cast for this week contact cast #8 applied Don't get cast  wet otal Other: - keep pressure off of feet. Medications-Please add to medication list.: Keystone Compound - Keystone gel right foot WOUND #11: - Foot Wound Laterality: Plantar, Right Cleanser: Soap and Water Every Other Day/30 Days [name], [name] ([id number]) [id number] Page 17 of 18 Discharge Instructions: Gently cleanse wound with antibacterial soap, rinse and pat dry prior to dressing wounds Topical: keystone gel Every Other Day/30 Days Prim Dressing: Aquacel Extra Hydrofiber Dressing, 2x2 (in/in) Every Other Day/30 Days [name] Secondary Dressing: ABD Pad 5x9 (in/in) (Generic) Every Other Day/30 Days Discharge Instructions: Cover with ABD pad Secured With: Medipore T - 2M Medipore H Soft Cloth Surgical T ape ape, 2x2 (in/yd) (Generic) Every Other Day/30 Days Secured With: Kerlix Roll Sterile or Non-Sterile 6-ply 4.5x4 (yd/yd) (Generic) Every Other Day/30 Days Discharge Instructions: Apply Kerlix as directed Add-Ons: total contact cast Every Other Day/30 Days WOUND #12: - Foot Wound Laterality: Plantar, Left, Medial Cleanser: Soap and Water 1 x Per Week/30 Days Discharge Instructions: Gently cleanse wound with antibacterial soap, rinse and pat dry prior to dressing wounds Topical: Vitamin AandD Ointment, 4(oz) tube 1 x Per Week/30 Days Discharge Instructions: apply to red, broken down area Prim Dressing: Apligraf 1 x Per Week/30 Days [name] Discharge Instructions: #2 applied today Secondary Dressing: ABD Pad 5x9 (in/in) (Generic) 1 x Per Week/30 Days Discharge Instructions: Cover with ABD pad Secondary Dressing: Gauze 1 x Per Week/30 Days Discharge Instructions: As directed: dry, moistened with saline or moistened with Dakins Solution Secured With: Medipore T - 2M Medipore H Soft Cloth Surgical T ape ape, 2x2 (in/yd) (Generic) 1 x Per Week/30 Days Secured With: [employer] Sterile or Non-Sterile 6-ply 4.5x4 (yd/yd) (Generic) 1 x Per Week/30 Days Discharge  Instructions: Apply Kerlix as directed Add-Ons: total contact cast 1 x Per Week/30 Days 1. I continued with the [facility] and silver alginate backing 2. Both of these wounds are worrisome for different reasons. 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 3. I like her to come back next week consideration of repeat total contact casting the left. Imaging of the right foot  up to and including an MRI may be necessary if that is not been done Electronic Signature(s) Signed: 08/24/2022 3:59:15 PM By: Heather Ham MD Entered By: [name] on 08/24/2022 12:00:51 -------------------------------------------------------------------------------- SuperBill Details Patient Name: Date of Service: [name] 08/24/2022 Medical Record Number: [id number] Patient Account Number: [id number] Date of Birth/Sex: Treating RN: [birth year] (46 y.o. 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 Facility Procedures : AAILYAH, DUNBAR Code: [id number] RISTI [name] ([id number]) Description: 11042 - DEB SUBQ TISSUE 20 SQ CM/< ICD-10 Diagnosis Description L97.512 Non-pressure chronic ulcer of other part of right foot with fat layer  exposed [phone] L97.528 Non-pressure chronic ulcer of other part of left foot with  other specified severity Modifier: [id number] Quantity: 1 f Page 18 of 18 Physician Procedures : CPT4 Code Description Modifier [id number] 11042 - WC PHYS SUBQ TISS 20 SQ CM ICD-10 Diagnosis Description L97.512 Non-pressure chronic ulcer of other part of right foot with fat layer exposed L97.528 Non-pressure chronic ulcer of other part of left foot  with other specified severity Quantity: 1 Electronic Signature(s) Signed: 08/24/2022 3:59:15 PM By: Heather Ham MD Entered By: [name] on 08/24/2022 12:01:06

## 2022-08-31 ENCOUNTER — Encounter: Payer: Medicaid Other | Admitting: Internal Medicine

## 2022-09-07 ENCOUNTER — Encounter (HOSPITAL_BASED_OUTPATIENT_CLINIC_OR_DEPARTMENT_OTHER): Payer: Medicaid Other | Admitting: Internal Medicine

## 2022-09-07 DIAGNOSIS — L97528 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ity: Secondary | ICD-10-CM

## 2022-09-07 DIAGNOSIS — L97512 Non-pressure chronic ulcer of other part of right foot with fat layer exposed: Secondary | ICD-10-CM | POA: Diagnosis not present

## 2022-09-07 DIAGNOSIS — E11621 Type 2 diabetes mellitus with foot ulcer: Secondary | ICD-10-CM | POA: Diagnosis not present

## 2022-09-09 NOTE — Progress Notes (Signed)
Heather, BUBOLZ [name] ([id number]) [id number] Page 1 of 11 Visit Report for 09/07/2022 Arrival Information Details Patient Name: Date of Service: [name], [name] 09/07/2022 10:30 [name]: [id number] Patient Account Number: [id number] Date of Birth/Sex: Treating RN: [birth year] (46 y.o. Orvan Falconer Primary Care Leyana [name]: [name] Other Clinician: Massie Kluver Referring Aditya Nastasi: Treating [name]/Extender: [name] in Treatment: 25 Visit Information History Since Last Visit All ordered tests and consults were completed: No Patient Arrived: Wheel Chair Added or deleted any medications: No Arrival Time: 10:57 Any new allergies or adverse reactions: No Transfer Assistance: None Had [name] fall or experienced change in No Patient Requires Transmission-Based Precautions: No activities of daily living that may affect Patient Has Alerts: No risk of falls: Signs or symptoms of abuse/neglect since last visito No Hospitalized since last visit: No Implantable device outside of the clinic excluding No cellular tissue based products placed in the center since last visit: Pain Present Now: Yes Electronic Signature(s) Signed: 09/08/2022 5:06:55 PM By: Massie Kluver Entered By: Massie Kluver on 09/07/2022 11:01:30 -------------------------------------------------------------------------------- Clinic Level of Care Assessment Details Patient Name: Date of Service: [name], [name] 09/07/2022 10:30 [name]: [id number] Patient Account Number: [id number] Date of Birth/Sex: Treating RN: 01/26/1976 (46 y.o. Orvan Falconer Primary Care Janiyah Beery: [name] Other Clinician: Massie Kluver Referring Thatcher Doberstein: Treating [name]/Extender: [name] in Treatment: 25 Clinic Level of Care Assessment Items TOOL 1 Quantity Score _0  - 0 Use when EandM and Procedure is  performed on INITIAL visit ASSESSMENTS - Nursing Assessment / Reassessment _1  - 0 General Physical Exam (combine w/ comprehensive assessment (listed just below) when performed on new pt. evals) _2  - 0 Comprehensive Assessment (HX, ROS, Risk Assessments, Wounds Hx, etc.) [name], [name] ([id number]) [id number] Page 2 of 11 ASSESSMENTS - Wound and Skin Assessment / Reassessment _3  - 0 Dermatologic / Skin Assessment (not related to wound area) ASSESSMENTS - Ostomy and/or Continence Assessment and Care _4  - 0 Incontinence Assessment and Management _5  - 0 Ostomy Care Assessment and Management (repouching, etc.) PROCESS - Coordination of Care _6  - 0 Simple Patient / Family Education for ongoing care _7  - 0 Complex (extensive) Patient / Family Education for ongoing care _8  - 0 Staff obtains Programmer, systems, Records, T Results / Process Orders est _9  - 0 Staff telephones HHA, Nursing Homes / Clarify orders / etc _10  - 0 Routine Transfer to another Facility (non-emergent condition) _11  - 0 Routine Hospital Admission (non-emergent condition) _12  - 0 New Admissions / Biomedical engineer / Ordering NPWT Apligraf, etc. , _13  - 0 Emergency Hospital Admission (emergent condition) PROCESS - Special Needs _14  - 0 Pediatric / Minor Patient Management _15  - 0 Isolation Patient Management _16  - 0 Hearing / Language / Visual special needs _17  - 0 Assessment of Community assistance (transportation, D/C planning, etc.) _18  - 0 Additional assistance / Altered mentation _19  - 0 Support Surface(s) Assessment (bed, cushion, seat, etc.) INTERVENTIONS - Miscellaneous _20  - 0 External ear exam _21  - 0 Patient Transfer (multiple staff / Civil Service fast streamer / Similar devices) _22  - 0 Simple Staple / Suture removal (25 or less) _23  - 0 Complex Staple / Suture removal (26 or more) _24  - 0 Hypo/Hyperglycemic Management (do not check if billed separately) _25  - 0 Ankle / Brachial Index (ABI) -  do not check if billed separately Has the patient been seen at the hospital within the last three years: Yes Total Score: 0  Level Of Care: ____ Electronic Signature(s) Signed: 09/08/2022 5:06:55 PM By: Massie Kluver Entered By: Massie Kluver on 09/07/2022 11:36:30 -------------------------------------------------------------------------------- Encounter Discharge Information Details Patient Name: Date of Service: [name], Heather 09/07/2022 10:30 [name]: [id number] Patient Account Number: [id number] Date of Birth/Sex: Treating RN: August 26, 1976 (46 y.o. Orvan Falconer Primary Care Nelline Lio: [name] Other Clinician: Massie Kluver Referring Henrine Hayter: Treating Julisa Flippo/Extender: [name] in Treatment: [address] [name] ([id number]) [id number] Page 3 of 11 Encounter Discharge Information Items Post Procedure Vitals Discharge Condition: Stable Temperature (F): 98.3 Ambulatory Status: Wheelchair Pulse (bpm): 94 Discharge Destination: Home Respiratory Rate (breaths/min): 16 Transportation: Private Auto Blood Pressure (mmHg): 142/85 Accompanied By: mother Schedule Follow-up Appointment: Yes Clinical Summary of Care: Electronic Signature(s) Signed: 09/08/2022 5:06:55 PM By: Massie Kluver Entered By: Massie Kluver on 09/07/2022 12:59:06 -------------------------------------------------------------------------------- Lower Extremity Assessment Details Patient Name: Date of Service: [name], [name] 09/07/2022 10:30 [name]: [id number] Patient Account Number: [id number] Date of Birth/Sex: Treating RN: October 03, 1976 (46 y.o. Orvan Falconer Primary Care Marchelle Rinella: [name] Other Clinician: Massie Kluver Referring Teandra Harlan: Treating Emree Locicero/Extender: Linna Hoff Weeks in Treatment: 25 Electronic Signature(s) Signed: 09/08/2022 5:06:55 PM By: Massie Kluver Signed: 09/09/2022 1:14:49 PM By: Carlene Coria RN Entered By: Massie Kluver on 09/07/2022 11:15:07 -------------------------------------------------------------------------------- Multi Wound Chart Details Patient Name: Date of Service: [name], [place] 09/07/2022 10:30 [name]: [id number] Patient Account Number: [id number] Date of Birth/Sex: Treating RN: Oct 08, 1976 (46 y.o. 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: Extremity Chronic sinus problems/congestion, Chronic sinus problems/congestion, N/[name] Comorbid History: Middle ear problems, Anemia, Chronic Middle ear problems, Anemia, Chronic Obstructive Pulmonary Disease Obstructive Pulmonary Disease (COPD), Congestive Heart Failure, (COPD), Congestive Heart Failure, Type II Diabetes, End Stage Renal Type II Diabetes, End Stage Renal Disease, History of pressure wounds, Disease, History of pressure wounds, Neuropathy Neuropathy 12/01/2021 03/16/2020 N/[name] Date Acquired: 25 25 N/[name] Weeks of Treatment: Open Open N/[name] Wound Status: No No N/[name] Wound Recurrence: 0.8x0.5x0.5 1.9x0.9x0.2 N/[name] Measurements L x W x D (cm) 0.314 1.343 N/[name] [name] (cm) : rea 0.157 0.269 N/[name] Volume (cm) : 83.00% 71.50% N/[name] % Reduction in Area: 90.50% 71.40% N/[name] % Reduction in Volume: Full Thickness Without Exposed Grade 3 N/[name] Classification: Support Structures Medium Medium N/[name] Exudate Amount: Serosanguineous Serous N/[name] Exudate Type: red, brown amber N/[name] Exudate Color: Distinct, outline attached Flat and Intact  N/[name] Wound Margin: Medium (34-66%) Large (67-100%) N/[name] Granulation Amount: Pink, Pale Red, Pink N/[name] Granulation Quality: Medium (34-66%) None Present (0%) N/[name] Necrotic Amount: Fat Layer (Subcutaneous Tissue): Yes Fat Layer (Subcutaneous Tissue): Yes N/[name] Exposed Structures: Fascia: No Fascia: No Tendon: No Tendon: No Muscle: No Muscle: No Joint: No Joint: No Bone: No Bone: No Medium (34-66%) Small (1-33%) N/[name] Epithelialization: Treatment Notes Electronic Signature(s) Signed: 09/08/2022 5:06:55 PM By: Massie Kluver Entered By: Massie Kluver on 09/07/2022 11:15:23 -------------------------------------------------------------------------------- [place] Details Patient Name: Date of Service: Kathryne [name] 09/07/2022 10:30 [name]: [id number] Patient Account Number: [id number] Date of Birth/Sex: Treating RN: [birth year] (46 y.o. Orvan Falconer Primary Care Kylena Mole: [name] Other Clinician: Massie Kluver  Referring Anshul Meddings: Treating Jaun Galluzzo/Extender: [name] in Treatment: 25 Active Inactive Abuse / Safety / Falls / Self Care Management Nursing Diagnoses: History of Falls Potential for falls Potential for injury related to falls Seelig, Tenae [name] ([id number]) [id number] Page 5 of 11 Goals: Patient will not develop complications from immobility Date Initiated: 03/17/2022 Date Inactivated: 04/20/2022 Target Resolution Date: 03/16/2022 Goal Status: Met Patient/caregiver will verbalize understanding of skin care regimen Date Initiated: 03/17/2022 Target Resolution Date: 03/16/2022 Goal Status: Active Patient/caregiver will verbalize/demonstrate measure taken to improve self care Date Initiated: 03/17/2022 Target Resolution Date: 03/16/2022 Goal Status: Active Interventions: Assess fall risk on admission and as needed Provide education on basic hygiene Provide  education on personal and home safety Notes: Necrotic Tissue Nursing Diagnoses: Impaired tissue integrity related to necrotic/devitalized tissue Knowledge deficit related to management of necrotic/devitalized tissue Goals: Necrotic/devitalized tissue will be minimized in the wound bed Date Initiated: 03/17/2022 Date Inactivated: 05/04/2022 Target Resolution Date: 03/16/2022 Goal Status: Met Patient/caregiver will verbalize understanding of reason and process for debridement of necrotic tissue Date Initiated: 03/17/2022 Target Resolution Date: 03/16/2022 Goal Status: Active Interventions: Provide education on necrotic tissue and debridement process Treatment Activities: Apply topical anesthetic as ordered : 03/16/2022 Excisional debridement : 03/16/2022 Notes: Osteomyelitis Nursing Diagnoses: Infection: osteomyelitis Knowledge deficit related to disease process and management Goals: Patient/caregiver will verbalize understanding of disease process and disease management Date Initiated: 03/17/2022 Date Inactivated: 04/20/2022 Target Resolution Date: 03/16/2022 Goal Status: Met Patient's osteomyelitis will resolve Date Initiated: 03/17/2022 Target Resolution Date: 03/16/2022 Goal Status: Active Signs and symptoms for osteomyelitis will be recognized and promptly addressed Date Initiated: 03/17/2022 Target Resolution Date: 03/16/2022 Goal Status: Active Interventions: Assess for signs and symptoms of osteomyelitis resolution every visit Provide education on osteomyelitis Treatment Activities: Systemic antibiotics : 03/16/2022 Notes: Wound/Skin Impairment Nursing Diagnoses: Impaired tissue integrity Knowledge deficit related to smoking impact on wound healing Knowledge deficit related to ulceration/compromised skin integrity Goals: Patient/caregiver will verbalize understanding of skin care regimen Date Initiated: 03/17/2022 Date Inactivated: 04/20/2022 Target Resolution Date:  03/16/2022 AALIA, GREULICH ([id number]) [id number] Page 6 of 11 Goal Status: Met Ulcer/skin breakdown will have [name] volume reduction of 30% by week 4 Date Initiated: 03/17/2022 Target Resolution Date: 04/13/2022 Goal Status: Active Ulcer/skin breakdown will have [name] volume reduction of 50% by week 8 Date Initiated: 03/17/2022 Target Resolution Date: 05/04/2022 Goal Status: Active Ulcer/skin breakdown will have [name] volume reduction of 80% by week 12 Date Initiated: 03/17/2022 Target Resolution Date: 06/01/2022 Goal Status: Active Ulcer/skin breakdown will heal within 14 weeks Date Initiated: 03/17/2022 Target Resolution Date: 06/15/2022 Goal Status: Active Interventions: Assess ulceration(s) every visit Provide education on ulcer and skin care Treatment Activities: Skin care regimen initiated : 03/16/2022 Notes: Electronic Signature(s) Signed: 09/08/2022 5:06:55 PM By: Massie Kluver Signed: 09/09/2022 1:14:49 PM By: Carlene Coria RN Entered By: Massie Kluver on 09/07/2022 11:15:14 -------------------------------------------------------------------------------- Pain Assessment Details Patient Name: Date of Service: Kathryne [name] 09/07/2022 10:30 [name]: [id number] Patient Account Number: [id number] Date of Birth/Sex: Treating RN: 1976/07/08 (46 y.o. Orvan Falconer Primary Care [place] Vandergriff: [name] Other Clinician: Massie Kluver Referring Maddalyn Lutze: Treating Ananiah Maciolek/Extender: [name] in Treatment: 25 Active Problems Location of Pain Severity and Description of Pain Patient Has Paino Yes Site Locations Pain Location: Pain in Ulcers With Dressing Change: No Duration of the Pain. Constant / Intermittento Constant Rate the pain. Current Pain Level: 8 Worst Pain Level: 10 Least Pain Level: 6 Tolerable Pain Level:  6 Character of Pain Describe the Pain: Burning, [place], Mascari [name]  ([id number]) [id number] Page 7 of 11 Pain Management and Medication Current Pain Management: Medication: Yes Cold Application: No Rest: Yes Massage: No Activity: Yes T.E.N.S.: No Heat Application: No Leg drop or elevation: No Is the Current Pain Management Adequate: Inadequate How does your wound impact your activities of daily livingo Sleep: Yes Bathing: No Appetite: No Relationship With Others: No Bladder Continence: No Emotions: No Bowel Continence: No Work: No Toileting: No Drive: No Dressing: No Hobbies: No Electronic Signature(s) Signed: 09/08/2022 5:06:55 PM By: Massie Kluver Signed: 09/09/2022 1:14:49 PM By: Carlene Coria RN Entered By: Massie Kluver on 09/07/2022 11:02:42 -------------------------------------------------------------------------------- Patient/Caregiver Education Details Patient Name: Date of Service: Michel Harrow 11/15/2023andnbsp10:30 [name]: [id number] Patient Account Number: [id number] Date of Birth/Gender: Treating RN: January 02, 1976 (46 y.o. Orvan Falconer Primary Care Physician: [name] Other Clinician: Massie Kluver Referring Physician: Treating Physician/Extender: [name] in Treatment: 25 Education Assessment Education Provided To: Patient Education Topics Provided Wound/Skin Impairment: Handouts: Other: conitnue wound care as directed Methods: Explain/Verbal Responses: State content correctly Electronic Signature(s) Signed: 09/08/2022 5:06:55 PM By: Massie Kluver Entered By: Massie Kluver on 09/07/2022 11:36:58 [name] ([id number]) [id number] Page 8 of 11 -------------------------------------------------------------------------------- Wound Assessment Details Patient Name: Date of Service: ELLIS, [name] 09/07/2022 10:30 [name]: [id number] Patient Account Number: [id number] Date of  Birth/Sex: Treating RN: [birth year] (46 y.o. Orvan Falconer Primary Care Keshana Klemz: [name] Other Clinician: Massie Kluver Referring Makeda Peeks: Treating Emslee Lopezmartinez/Extender: Linna Hoff Weeks in Treatment: 25 Wound Status Wound Number: 11 Primary Open Surgical Wound Etiology: Wound Location: Right, Plantar Foot Wound Open Wounding Event: Surgical Injury Status: Date Acquired: 12/01/2021 Comorbid Chronic sinus problems/congestion, Middle ear problems, Anemia, Weeks Of Treatment: 25 History: Chronic Obstructive Pulmonary Disease (COPD), Congestive Heart Clustered Wound: No Failure, Type II Diabetes, End Stage Renal Disease, History of pressure wounds, Neuropathy Photos Wound Measurements Length: (cm) Width: (cm) Depth: (cm) Area: (cm) Volume: (cm) 0.8 % Reduction in Area: 83% 0.5 % Reduction in Volume: 90.5% 0.5 Epithelialization: Medium (34-66%) 0.314 Tunneling: No 0.157 Undermining: No Wound Description Classification: Full Thickness Without Exposed Suppo Wound Margin: Distinct, outline attached Exudate Amount: Medium Exudate Type: Serosanguineous Exudate Color: red, brown rt Structures Foul Odor After Cleansing: No Slough/Fibrino Yes Wound Bed Granulation Amount: Medium (34-66%) Exposed Structure Granulation Quality: Pink, Pale Fascia Exposed: No Necrotic Amount: Medium (34-66%) Fat Layer (Subcutaneous Tissue) Exposed: Yes Tendon Exposed: No Muscle Exposed: No Joint Exposed: No Bone Exposed: No Treatment Notes Wound #11 (Foot) Wound Laterality: Plantar, Right Cleanser Dakin 16 (oz) 0.25 Discharge Instruction: Use as directed. BREALYNN, CONTINO [name] ([id number]) [id number] Page 9 of 11 Soap and Water Discharge Instruction: Gently cleanse wound with antibacterial soap, rinse and pat dry prior to dressing wounds Peri-Wound Care Topical keystone gel Primary Dressing Aquacel Extra Hydrofiber Dressing, 2x2  (in/in) Secondary Dressing ABD Pad 5x9 (in/in) Discharge Instruction: Cover with ABD pad Secured With Medipore T - 45M Medipore H Soft Cloth Surgical T ape ape, 2x2 (in/yd) Kerlix Roll Sterile or Non-Sterile 6-ply 4.5x4 (yd/yd) Discharge Instruction: Apply Kerlix as directed Compression Wrap Compression Stockings Add-Ons Electronic Signature(s) Signed: 09/08/2022 5:06:55 PM By: Massie Kluver Signed: 09/09/2022 1:14:49 PM By: Carlene Coria RN Entered By: Massie Kluver on 09/07/2022 11:14:20 -------------------------------------------------------------------------------- Wound Assessment Details Patient Name: Date of Service: Kathryne [name] 09/07/2022 10:30 [name]: [id number] Patient Account  Number: [id number] Date of Birth/Sex: Treating RN: Dec 07, 1975 (46 y.o. Orvan Falconer Primary Care Helder Crisafulli: [name] Other Clinician: Massie Kluver Referring Deshia Vanderhoof: Treating Gracyn Allor/Extender: Linna Hoff Weeks in Treatment: 25 Wound Status Wound Number: 12 Primary Diabetic Wound/Ulcer of the Lower Extremity Etiology: Wound Location: Left, Medial, Plantar Foot Wound Open Wounding Event: Pressure Injury Status: Date Acquired: 03/16/2020 Comorbid Chronic sinus problems/congestion, Middle ear problems, Anemia, Weeks Of Treatment: 25 History: Chronic Obstructive Pulmonary Disease (COPD), Congestive Heart Clustered Wound: No Failure, Type II Diabetes, End Stage Renal Disease, History of pressure wounds, Neuropathy Photos CHISTINE, [name] [name] ([id number]) [id number] Page 10 of 11 Wound Measurements Length: (cm) 1.9 Width: (cm) 0.9 Depth: (cm) 0.2 Area: (cm) 1.343 Volume: (cm) 0.269 % Reduction in Area: 71.5% % Reduction in Volume: 71.4% Epithelialization: Small (1-33%) Tunneling: No Undermining: No Wound Description Classification: Grade 3 Wound Margin: Flat and Intact Exudate Amount: Medium Exudate  Type: Serous Exudate Color: amber Foul Odor After Cleansing: No Slough/Fibrino No Wound Bed Granulation Amount: Large (67-100%) Exposed Structure Granulation Quality: Red, Pink Fascia Exposed: No Necrotic Amount: None Present (0%) Fat Layer (Subcutaneous Tissue) Exposed: Yes Tendon Exposed: No Muscle Exposed: No Joint Exposed: No Bone Exposed: No Treatment Notes Wound #12 (Foot) Wound Laterality: Plantar, Left, Medial Cleanser Dakin 16 (oz) 0.25 Discharge Instruction: Use as directed. Peri-Wound Care Topical Vitamin AandD Ointment, 4(oz) tube Discharge Instruction: apply to red, broken down area Primary Dressing Gauze Discharge Instruction: moisten with Dakins solution Secondary Dressing ABD Pad 5x9 (in/in) Discharge Instruction: Cover with ABD pad Secured With [place] H Soft Cloth Surgical T ape ape, 2x2 (in/yd) Kerlix Roll Sterile or Non-Sterile 6-ply 4.5x4 (yd/yd) Discharge Instruction: Apply Kerlix as directed Compression Wrap Compression Stockings Add-Ons Electronic Signature(s) Signed: 09/08/2022 5:06:55 PM By: Massie Kluver Signed: 09/09/2022 1:14:49 PM By: Carlene Coria RN Entered By: Massie Kluver on 09/07/2022 11:14:56 [name] ([id number]) [id number] Page 11 of 11 -------------------------------------------------------------------------------- Vitals Details Patient Name: Date of Service: MIZANI, [name] 09/07/2022 10:30 [name]: [id number] Patient Account Number: [id number] Date of Birth/Sex: Treating RN: 04/25/1976 (46 y.o. Orvan Falconer Primary Care Marinell Igarashi: [name] Other Clinician: Massie Kluver Referring Ashni Lonzo: Treating Martena Emanuele/Extender: [name] in Treatment: 25 Vital Signs Time Taken: 11:07 Temperature (F): 98.3 Height (in): 69 Pulse (bpm): 94 Weight (lbs): 178 Respiratory Rate (breaths/min): 16 Body Mass Index (BMI):  26.3 Blood Pressure (mmHg): 142/85 Reference Range: 80 - 120 mg / dl Electronic Signature(s) Signed: 09/08/2022 5:06:55 PM By: Massie Kluver Entered By: Massie Kluver on 09/07/2022 12:58:15

## 2022-09-09 NOTE — Progress Notes (Signed)
Heather [name], Heather [name] [name] ([id number]) [id number] Page 1 of 18 Visit Report for [name] Chief Complaint Document Details Patient Name: Date of Service: Heather [name], Heather [name] 10:30 [name]: [id number] Patient Account Number: [id number] Date of Birth/Sex: Treating RN: Oct 25, 1975 (46 y.o. Heather [name] Primary Care Provider: Raelene [name] Other Clinician: Massie [name] Referring Provider: Treating Provider/Extender: Heather [name] in Treatment: 25 Information Obtained from: Patient Chief Complaint 03/19/2021; patient referred by Dr. Luana [name] who has been looking after her left foot for quite [name] period of time for review of [name] nonhealing area in the left midfoot 03/12/2022; bilateral feet wounds and right lower extremity wound. Electronic Signature(s) Signed: 09/07/2022 2:25:25 PM By: Heather Shan DO Entered By: Heather [name] on [name] 13:36:22 -------------------------------------------------------------------------------- Debridement Details Patient Name: Date of Service: Heather [name], Heather [name] [name] 10:30 [name]: [id number] Patient Account Number: [id number] Date of Birth/Sex: Treating RN: 07-Aug-1976 (46 y.o. Heather [name] Primary Care Provider: Raelene [name] Other Clinician: Massie [name] Referring Provider: Treating Provider/Extender: Heather [name] in Treatment: 25 Debridement Performed for Assessment: Wound #12 Left,Medial,Plantar Foot Performed By: Physician Heather Shan, MD Debridement Type: Debridement Severity of Tissue Pre Debridement: Fat layer exposed Level of Consciousness (Pre-procedure): Awake and Alert Pre-procedure Verification/Time Out Yes - 11:24 Taken: Start Time: 11:24 T Area Debrided (L x W): otal 2.5 (cm) x 2 (cm) = 5 (cm) Tissue and other material debrided: Viable, Non-Viable, Slough, Subcutaneous, Slough Level:  Skin/Subcutaneous Tissue Debridement Description: Excisional Instrument: Curette Bleeding: Minimum Hemostasis Achieved: Pressure Response to Treatment: Procedure was tolerated well Level of Consciousness (Post- Awake and Alert procedure): Heather [name], Heather [name] [name] ([id number]) [id number] Page 2 of 18 Post Debridement Measurements of Total Wound Length: (cm) 1.9 Width: (cm) 0.9 Depth: (cm) 0.3 Volume: (cm) 0.403 Character of Wound/Ulcer Post Debridement: Stable Severity of Tissue Post Debridement: Fat layer exposed Post Procedure Diagnosis Same as Pre-procedure Electronic Signature(s) Signed: 09/07/2022 2:25:25 PM By: Heather Shan DO Signed: 09/08/2022 5:06:55 PM By: Heather [name] Signed: 09/09/2022 1:14:49 PM By: Heather Coria RN Entered By: Heather [name] on [name] 11:35:14 -------------------------------------------------------------------------------- Debridement Details Patient Name: Date of Service: Heather Hitch [name] [name] 10:30 [name]: [id number] Patient Account Number: [id number] Date of Birth/Sex: Treating RN: 15-Feb-1976 (46 y.o. Heather [name] Primary Care Provider: Raelene [name] Other Clinician: Massie [name] Referring Provider: Treating Provider/Extender: Heather [name] in Treatment: 25 Debridement Performed for Assessment: Wound #11 Right,Plantar Foot Performed By: Physician Heather Shan, MD Debridement Type: Debridement Level of Consciousness (Pre-procedure): Awake and Alert Pre-procedure Verification/Time Out Yes - 11:32 Taken: Start Time: 11:32 T Area Debrided (L x W): otal 1 (cm) x 1 (cm) = 1 (cm) Tissue and other material debrided: Viable, Non-Viable, Slough, Subcutaneous, Slough Level: Skin/Subcutaneous Tissue Debridement Description: Excisional Instrument: Curette Bleeding: Minimum Hemostasis Achieved: Pressure Response to Treatment: Procedure was tolerated  well Level of Consciousness (Post- Awake and Alert procedure): Post Debridement Measurements of Total Wound Length: (cm) 0.8 Width: (cm) 0.5 Depth: (cm) 0.6 Volume: (cm) 0.188 Character of Wound/Ulcer Post Debridement: Stable Post Procedure Diagnosis Same as Pre-procedure Electronic Signature(s) Signed: 09/07/2022 2:25:25 PM By: Heather Shan DO Signed: 09/08/2022 5:06:55 PM By: Heather [name] Signed: 09/09/2022 1:14:49 PM By: Heather Coria RN Entered By: Heather [name] on [name] 11:36:10 Heather [name] ([id number]) [id number] Page 3 of 18 -------------------------------------------------------------------------------- HPI Details Patient Name: Date of Service: Heather [name], Heather [name] [name] 10:30 [name]: [id number] Patient Account  Number: [id number] Date of Birth/Sex: Treating RN: 03-29-1976 (46 y.o. Heather [name] Primary Care Provider: Raelene [name] Other Clinician: Massie [name] Referring Provider: Treating Provider/Extender: Heather [name] in Treatment: 25 History of Present Illness HPI Description: 01/18/18-She is here for initial evaluation of the left great toe ulcer. She is [name] poor historian in regards to timeframe in detail. She states approximately 4 weeks ago she lacerated her toe on something in the house. She followed up with her primary care who placed her on Bactrim and ultimately [name] second dose of Bactrim prior to coming to wound clinic. She states she has been treating the toe with peroxide, Betadine and [name] Band-Aid. She did not check her blood sugar this morning but checked it yesterday morning it was 327; she is unaware of [name] recent A1c and there are no current records. She saw Dr. she would've orthopedics last week for an old injury to the left ankle, she states he did not see her toe, nor did she bring it to his attention. She smokes approximately 1 pack cigarettes [name] day. Her social  situation is concerning, she arrives this morning with her mother who appears extremely intoxicated/under the influence; her mother was asked to leave the room and be monitored by the patient's grandmother. The patient's aunt then accompanied the patient and the room throughout the rest of the appointment. We had [name] lengthy discussion regarding the deleterious effects of uncontrolled hyperglycemia and smoking as it relates to wound healing and overall health. She was strongly encouraged to decrease her smoking and get her diabetes under better control. She states she is currently on [name] diet and has cut down her [facility] consumption. The left toe is erythematous, macerated and slightly edematous with malodor present. The edema in her left foot is below her baseline, there is no erythema streaking. We will treat her with Santyl, doxycycline; we have ordered and xray, culture and provided [name] Peg assist surgical shoe and cultured the wound. 01/25/18-She is here in follow-up evaluation for [name] left great toe ulcer and presents with an abscess to her suprapubic area. She states her blood sugars remain elevated, feeling "sick" and if levels are below 250, but she is trying. She has made no attempt to decrease her smoking stating that we "can't take away her food in her cigarettes". She has been compliant with offloading using the PEG assist you. She is using Santyl daily. the culture obtained last week grew staph aureus and Enterococcus faecalis; continues on the doxycycline and Augmentin was added on Monday. The suprapubic area has erythema, no femoral variation, purple discoloration, minimal induration, was accessed with [name] cotton tip applicator with sanguinopurulent drainage, this was cultured, I suspect the current antibiotic treatment will cover and we will not add anything to her current treatment plan. She was advised to go to urgent care or ER with any change in redness, induration or fever. 02/01/18-She  is here in follow-up evaluation for left great toe ulcers and [name] new abdominal abscess from last week. She was able to use packing until earlier this week, where she "forgot it was there". She states she was feeling ill with GI symptoms last week and was not taking her antibiotic. She states her glucose levels have been predominantly less than 200, with occasional levels between 200-250. She thinks this was contributing to her GI symptoms as they have resolved without intervention. There continues to be significant laceration to left toe, otherwise it clinically looks stable/improved. There  is now less superficial opening to the lateral aspect of the great toe that was residual blister. We will transition to [facility] ([place] to all wounds, she will continue her Augmentin. If there is no change or deterioration next week for reculture. 02/08/18-She is here in follow-up evaluation for left great toe ulcer and abdominal ulcer. There is an improvement in both wounds. She has been wrapping her left toe with coban, not by our direction, which has created an area of discoloration to the medial aspect; she has been advised to NOT use coban secondary to her neuropathy. She states her glucose levels have been high over this last week ranging from 200-350, she continues to smoke. She admits to being less compliant with her offloading shoe. We will continue with same treatment plan and she will follow-up next week. 02/15/18-She is here in follow-up evaluation for left great toe ulcer and abdominal ulcer. The abdominal ulcer is epithelialized. The left great toe ulcer is improved and all injury from last week using the Coban wrap is resolved, the lateral ulcer is healed. She admits to noncompliance with wearing offloading shoe and admits to glucose levels being greater than 300 most of the week. She continues to smoke and expresses no desire to quit. There is one area medially that probes deeper than it has historically,  erythema to the toe and dorsal foot has consistently waxed and waned. There is no overt signs of cellulitis or infection but we will culture the wound for any occult infection given the new area of depth and erythema. We will hold off on sensitivities for initiation of antibiotic therapy. 02/22/18-She is here in follow up evaluation for left great toe ulcer. There is overall significant improvement in both wound appearance, erythema and edema with changes made last week. She was not initiated on antibiotic therapy. Culture obtained last week showed oxacillin sensitive staph aureus, sensitive to clindamycin. Clindamycin has been called into the pharmacy but she has been instructed to hold off on initiation secondary to overall clinical improvement and her history of antibiotic intolerance. She has been instructed to contact the clinic with any noted changes/deterioration and the wound, erythema, edema and/or pain. She will follow-up next week. She continues to smoke and her glucose levels remain elevated >250; she admits to compliance with offloading shoe 03/01/18 on evaluation today patient appears to be doing fairly well in regard to her left first toe ulcer. She has been tolerating the dressing changes with the [facility] Dressing without complication and overall this has definitely showed signs of improvement according to records as well is what the patient tells me today. I'm very pleased in that regard. She is having no pain today 03/08/18 She is here for follow up evaluation of [name] left great toe ulcer. She remains non-compliant with glucose control and smoking cessation; glucose levels consistently >200. She states that she got new shoe inserts/peg assist. She admits to compliance with offloading. Since my last evaluation there is significant improvement. We will switch to prisma at this time and she will follow up next week. She is noted to be tachycardic at this appointment, heart rate 120s;  she has [name] history of heart rate 70-130 according to our records. She admits to extreme agitation r/t personal issues; she was advised to monitor her heartrate and contact her physician if it does not return to [name] more normal range (<100). She takes cardizem twice daily. 03/15/18-She is here in follow-up evaluation for left great toe ulcer. She remains  noncompliant with glucose control and smoking cessation. She admits to compliance with wearing offloading shoe. The ulcer is improved/stable and we will continue with the same treatment plan and she will follow-up next week 03/22/18-She is here for evaluation for left great toe ulcer. There continues to be significant improvement despite recurrent hyperglycemia (over 500 yesterday) and she continues to smoke. She has been compliant with offloading and we will continue with same treatment plan and she will follow-up next week. 03/29/18-She is here for evaluation for left great toe ulcer. Despite continuing to smoke and uncontrolled diabetes she continues to improve. She is compliant with offloading shoe. We will continue with the same treatment plan and she will follow-up next week 04/05/18- She is here in follow up evaluation for [name] left great toe ulcer; she presents with small pustule to left fifth toe (resembles ant bite). She admits to compliance with wearing offloading shoe; continues to smoke or have uncontrolled blood glucose control. There is more callus than usual with evidence of bleeding; she denies known trauma. 04/12/18-She is here for evaluation of left great toe ulcer. Despite noncompliance with glycemic control and smoking she continues to make improvement. She continues to wear offloading shoe. The pustule, that was identified last week, to the left fifth toe is resolved. She will follow-up in 2 weeks 05/03/18-she is seen in follow-up evaluation for [name] left great toe ulcer. She is compliant with offloading, otherwise noncompliant with glycemic  control and Jobson, Zamyah [name] ([id number]) [id number] Page 4 of 18 smoking. She has plateaued and there is minimal improvement noted. We will transition to [facility], replaced the insert to her surgical shoe and she will follow-up in one week 05/10/18- She is here in follow up evaluation for [name] left great toe ulcer. It appears stable despite measurement change. We will continue with same treatment plan and follow up next week. 05/24/18-She is seen in follow-up evaluation for [name] left great toe ulcer. She remains compliant with offloading, has made significant improvement in her diet, decreasing the amount of sugar/soda. She said her recent A1c was 10.9 which is lower than. She did see [name] diabetic nutritionist/educator yesterday. She continues to smoke. We will continue with the same treatment plan and she'll follow-up next week. 05/31/18- She is seen in follow-up evaluation for left great toe ulcer. She continues to remain compliant with offloading, continues to make improvement in her diet, increasing her water and decreasing the amount of sugar/soda. She does continue to smoke with no desire to quit. We will apply Prisma to the depth and Hydrofera Blue over. We have not received insurance authorization for oasis. She will follow up next week. 06/07/18-She is seen in follow-up evaluation for left great toe ulcer. It has stalled according to today's measurements although base appears stable. She says she saw [name] diabetic educator yesterday; her average blood sugars are less than 300 which is an improvement for her. She continues to smoke and states "that's my next step" She continues with water over soda. We will order for xray, culture and reinstate ace wrap compression prior to placing apligraf for next week. She is voicing no complaints or concerns. Her dressing will change to iodoflex over the next week in preparation for apligraf. 06/14/18-She is seen in follow-up evaluation  for left great toe ulcer. Plain film x-ray performed last week was negative for osteomyelitis. Wound culture obtained last week grew strep B and OSSA; she is initiated on keflex and cefdinir today; there is erythema to the  toe which could be from ace wrap compression, she has [name] history of wrapping too tight and has has been encouraged to maintain ace wraps that we place today. We will hold off on application of apligraf today, will apply next week after antibiotic therapy has been initiated. She admits today that she has resumed taking [name] shower with her foot/toe submerged in water, she has been reminded to keep foot/toe out of the bath water. She will be seen in follow up next week 06/21/18-she is seen in follow-up evaluation for left great toe ulcer. She is tolerating antibiotic therapy with no GI disturbance. The wound is stable. Apligraf was applied today. She has been decreasing her smoking, only had 4 cigarettes yesterday and 1 today. She continues being more compliant in diabetic diet. She will follow-up next week for evaluation of site, if stable will remove at 2 weeks. 06/28/18- She is here in follow up evalution. Apligraf was placed last week, she states the dressing fell off on Tuesday and she was dressing with hydrofera blue. She is healed and will be discharged from the clinic today. She has been instructed to continue with smoking cessation, continue monitoring glucose levels, offloading for an additional 4 weeks and continue with hydrofera blue for additional two weeks for any possible microscopic opening. Readmission: 08/07/18 on evaluation today patient presents for reevaluation concerning the ulcer of her right great toe. She was previously discharged on 06/28/18 healed. Nonetheless she states that this began to show signs of drainage she subsequently went to her primary care provider. Subsequently an x-ray was performed on 08/01/18 which was negative. The patient was also placed on  antibiotics at that time. Fortunately they should have been effective for the infection. Nonetheless she's been experiencing some improvement but still has [name] lot of drainage coming from the wound itself. 08/14/18 on evaluation today patient's wound actually does show signs of improvement in regard to the erythema at this point. She has completed the antibiotics. With that being said we did discuss the possibility of placing her in [name] total contact cast as of today although I think that I may want to give this just [name] little bit more time to ensure nothing recurrence as far as her infection is concerned. I do not want to put in the cast and risk infection at that time if things are not completely resolved. With that being said she is gonna require some debridement today. 08/21/18 on evaluation today patient actually appears to be doing okay in regard to her toe ulcer. She's been tolerating the dressing changes without complication. With that being said it does appear that she is ready and in fact I think it's appropriate for [place] to go ahead and initiate the total contact cast today. Nonetheless she will require some sharp debridement to prepare the wound for application. Overall I feel like things have been progressing well but we do need to do something to get this to close more readily. 08/24/18 patient seen today for reevaluation after having had the total contact cast applied on Tuesday. She seems to have done very well the wound appears to be doing great and overall I'm pleased with the progress that she's made. There were no abnormal areas of rubbing from the cast on her lower extremity. 08/30/18 on evaluation today patient actually appears to be completely healed in regard to her plantar toe ulcer. She tells me at this point she's been having [name] lot of issues with the cast. She almost fell  [name] couple of times the state shall the step of her dog [name] couple times as well. This is been [name] very frustrating  process for her other nonetheless she has completely healed the wound which is excellent news. Overall there does not appear to be the evidence of infection at this time which is great news. 09/11/18 evaluation today patient presents for follow-up concerning her great toe ulcer on the left which has unfortunately reopened since I last saw her which was only [name] couple of weeks ago. Unfortunately she was not able to get in to get the shoe and potentially the AFO that's gonna be necessary due to her left foot drop. She continues with offloading shoe but this is not enough to prevent her from reopening it appears. When we last had her in the total contact cast she did well from [name] healing standpoint but unfortunately the wound reopened as soon as she came out of the cast within just [name] couple of weeks. 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 This is also part of the reason why she falls on [name] regular basis. I just do not believe that is gonna be safe for [place] to reinitiate the total contact cast as last time we had this on she fell 3 times one week which is definitely not normal for her. 09/18/18 upon evaluation today the patient actually appears to be doing about the same in regard to her toe ulcer. She did not contact [facility] as I asked her to even though I had given her the prescription. In fact she actually states that she has no idea where the prescription is. She did apparently call [facility] and they told her that all she needed to do was bring the prescription in order to be able to be seen and work on getting the AFO for her left foot. With all that being said she still does not have an appointment and I'm not sure were things stand that regard. I will give her [name] new prescription today in order to contact  them to get this set up. 09/25/18 on evaluation today patient actually appears to be doing about the same in regard to her toes ulcer. She does have [name] small areas which seems to have [name] lot of callous buildup around the edge of the wound which is going to need sharp debridement today. She still is waiting to be scheduled for evaluation with [facility] for possibility of an AFO. She states there supposed to call her tomorrow to get this set up. Unfortunately it does appear that her foot specifically the toe area is showing signs of erythema. There does not appear to be any systemic infection which is in these good news. 10/02/18 on evaluation today patient actually appears to be doing about the same in regard to her toe ulcer. This really has not done too well although it's not significantly larger it's also not significantly smaller. She has been tolerating the dressing changes without complication. She actually has her appointment with [facility] and [place] tomorrow to hopefully be measured for obtaining and AFO splint. I think this would be helpful preventing this from reoccurring. We had contemplated starting the cast this week although to be honest I am reluctant to do that as she's been having  nausea, vomiting, and seizure activity over the past three days. She has [name] history of seizures and have been told is nothing that can be done for these. With that being said I do believe that along with the seizures have the nausea vomiting which upon further questioning doesn't seem to be the normal for her and makes me concerned for the possibility of infection or something else going on. I discussed this with the patient and her mother during the office visit today. I do not feel the wound is effective but maybe something else. The responses this was "this just happens to her at times and we don't know why". They did not seem to be interested in going to the hospital to have this checked out further. 10/09/18  on evaluation today patient presents for follow-up concerning her ongoing toe ulcer. She has been tolerating the dressing changes without complication. Fortunately there does not appear to be any evidence of infection which is great news however I do think that the patient would benefit from going ahead for with the total contact cast. She's actually in [name] wheelchair today she tells me that she will use her walker if we initiate the cast. I was very specific about the fact that if we were gonna do the cast I wanted to make sure that she was using the walker in order to prevent any falls. She tells me she does not have stairs that she has to traverse on [name] regular basis at her home. She has not had any seizures since last week again that something that happens to her often she tells me she did talk to [employer] and they said that it may take up to three weeks to get the brace approved for her. Hopefully that will not take that long but nonetheless in the meantime I do think the cast could be of benefit. 10/12/18 on evaluation today patient appears to be doing rather well in regard to her toe ulcer. It's just been [name] few days and already this is significantly improved both as far as overall appearance and size. Fortunately there's no sign of infection. She is here for her first obligatory cast change. Heather [name], Heather [name] ([id number]) [id number] Page 5 of 18 10/19/18 Seen today for follow up and management of left great toe ulcer. Wound continues to show improvement. Noted small open area with seroussang drainage with palpation. Denies any increased pain or recent fevers during visit. She will continue calcium alginate with offloading shoe. Denies any questions or concerns during visit. 10/26/18 on evaluation today patient appears to be doing about the same as when I last saw her in regard to her wound bed. Fortunately there does not appear to be any signs of infection. Unfortunately she  continues to have [name] breakdown in regard to the toe region any time that she is not in the cast. It takes almost no time at all for this to happen. Nonetheless she still has not heard anything from the brace being made by [employer] as to when exactly this will be available to her. Fortunately there is no signs of infection at this time. 10/30/18 on evaluation today patient presents for application of the total contact cast as we just received him this morning. Fortunately we are gonna be able to apply this to her today which is great news. She continues to have no significant pain which is good news. Overall I do feel like things have been improving while she was the cast is when  she doesn't have [name] cast that things get worse. She still has not really heard anything from [facility] regarding her brace. 11/02/18 upon evaluation today patient's wound already appears to be doing significantly better which is good news. Fortunately there does not appear to be any signs of infection also good news. Overall I do think the total contact cast as before is helping to heal this area unfortunately it's just not gonna likely keep the area closed and healed without her getting her brace at least. Again the foot drop is [name] significant issue for her. 11/09/18 on evaluation today patient appears to be doing excellent in regard to her toe ulcer which in fact is completely healed. Fortunately we finally got the situation squared away with the paperwork which was needed to proceed with getting her brace approved by Medicaid. I have filled that out unfortunately that information has been sent to the orthopedic office that I worked at 2 1/2 years ago and not tired Current wound care measures. Fortunately she seems to be doing very well at this time. 11/23/18 on evaluation today patient appears to be doing More Poorly Compared to Last Time I Saw Her. At [facility] She Had Completely Healed. Currently she is continuing to have issues  with reopening. She states that she just found out that the brace was approved through Medicaid now she just has to go get measured in order to have this fitted for her and then made. Subsequently she does not have an appointment for this yet that is going to complicate things we obviously cannot put her back in the cast if we do not have everything measured because they're not gonna be able to measure her foot while she is in the cast. Unfortunately the other thing that I found out today as well is that she was in the hospital over the weekend due to having [name] heroin overdose. Obviously this is unfortunate and does have me somewhat worried as well. 11/30/18 on evaluation today patient's toe ulcer actually appears to be doing fairly well. The good news is she will be getting her brace in the shoes next week on Wednesday. Hopefully we will be able to get this to heal without having to go back in the cast however she may need the cast in order to get the wound completely heal and then go from there. Fortunately there's no signs of infection at this time. 12/07/18 on evaluation today patient fortunately did receive her brace and she states she could tell this definitely makes her walk better. With that being said she's been having issues with her toe where she noticed yesterday there was [name] lot of tissue that was loosing off this appears to be much larger than what it was previous. She also states that her leg has been read putting much across the top of her foot just about the ankle although this seems to be receiving somewhat. The total area is still red and appears to be someone infected as best I can tell. She is previously taken Bactrim and that may be [name] good option for her today as well. We are gonna see what I wound culture shows as well and I think that this is definitely appropriate. With that being said outside of the culture I still need to initiate something in the interim and that's what I'm gonna  go ahead and select Bactrim is [name] good option for her. 12/14/18 on evaluation today patient appears to be doing better in regard  to her left great toe ulcer as compared to last week's evaluation. There's still some erythema although this is significantly improved which is excellent news. Overall I do believe that she is making good progress is still gonna take some time before she is where I would like her to be from the standpoint of being able to place her back into the total contact cast. Hopefully we will be where we need to be by next week. 12/21/18 on evaluation today patient actually appears to be doing poorly in regard to her toe ulcer. She's been tolerating the dressing changes without complication. Fortunately there's no signs of systemic infection although she does have [name] lot of drainage from the toe ulcer and this does seem to be causing some issues at this point. She does have erythema on the distal portion of her toe that appears to be likely cellulitis. 12/28/18 on evaluation today patient actually appears to be doing [name] little better in my pinion in regard to her toe ulcer. With that being said she still does have some evidence of infection at this time and for her culture she had both E. coli as well as enterococcus as organisms noted on evaluation. For that reason I think that though the Keflex likely has treated the E. coli rather well this has really done nothing for the enterococcus. We are going to have to initiate treatment for this specifically. 01/04/19 on evaluation today patient's toe actually appears to be doing better from the standpoint of infection. She currently would like to see about putting the cash back on I think that this is appropriate as long as she takes care of it and keeps it from getting wet. She is gonna have some drainage we can definitely pass this up with Drawtex and alginate to try to prevent as much drainage as possible from causing the problems. With that  being said I do want to at least try her with the cast between now and Tuesday. If there any issues we can't continue to use it then I will discontinue the use of the cast at that point. 01/08/19 on evaluation today patient actually appears to be doing very well as far as her foot ulcer specifically the great toe on the left is concerned. She did have an area of rubbing on the medial aspect of her left ankle which again is from the cast. Fortunately there's no signs of infection at this point in this appears to be [name] very slight skin breakdown. The patient tells me she felt it rubbing but didn't think it was that bad. Fortunately there is no signs of active infection at this time which is good news. No fevers, chills, nausea, or vomiting noted at this time. 01/15/19 on evaluation today patient actually appears to be doing well in regard to her toe ulcer. Again as previous she seems to do well and she has the cast on which indicates to me that during the time she doesn't have [name] cast on she's putting way too much pressure on this region. Obviously I think that's gonna be an issue as with the current national emergency concerning the Covid-19 Virus it has been recommended that we discontinue the use of total contact casting by the chief medical officer of our company, Dr. Simona Huh. The reasoning is that if [name] patient becomes sick and cannot come into have the cast removed they could not just leave this on for an additional two weeks. Obviously the hospitals also do not want  to receive patient's who are sick into the emergency department to potentially contaminate the region and spread the Covid-19 Virus among other sick individuals within the hospital system. Therefore at this point we are suspending the use of total contact cast until the current emergency subsides. This was all discussed with the patient today as well. 01/22/19 on evaluation today patient's wound on her left great toe appears to be doing  slightly worse than previously noted last week. She tells me that she has been on this quite [name] bit in fact she tells me she's been awake for 38 straight hours. This is due to the fact that she's having to care for grandparents because nobody else will. She has been taking care of them for five the last seven days since I've seen her they both have dementia his is from [name] stroke and her grandmother's was progressive. Nonetheless she states even her mom who knows her condition and situation has only help two of those days to take care of them she's been taking care of the rest. Fortunately there does not appear to be any signs of active infection in regard to her toe at this point although obviously it doesn't look as good as it did previous. I think this is directly related to her not taking off the pressure and friction by way of taking things easy. Though I completely understand what's going on. 01/29/19 on evaluation today patient's tools are actually appears to be showing some signs of improvement today compared to last week's evaluation as far as not necessarily the overall size of the wound but the fact that she has some new skin growth in between the two ends of the wound opening. Overall I feel like she has done well she states that she had [name] family member give her what sounds to be [name] CAM walker boot which has been helpful as well. 02/05/19 on evaluation today patient's wound bed actually appears to be doing significantly better in regard to her overall appearance of the size of the wound. With that being said she is still having an issue with offloading efficiently enough to get this to close. Apparently there is some signs of infection at this point as well unfortunately. Previously she's done well of Augmentin I really do not see anything that needs to be culture currently but there theme and cellulitis of the foot that I'm seeing I'm gonna go ahead and place her on an antibiotic today to try to  help clear this up. 02/12/2019 on evaluation today patient actually appears to be doing poorly in regard to her overall wound status. She tells me she has been using her offloading shoe but actually comes in today wearing her tennis shoe with the AFO brace. Again as I previously discussed with her this is really not sufficient to allow the area to heal appropriately. Nonetheless she continues to be somewhat noncompliant and I do wonder based on what she has told my nurse in the past as to Heather [name], Heather [name] ([id number]) [id number] Page 6 of 18 whether or not [name] good portion of this noncompliance may be recreational drug and alcohol related. She has had [name] history of heroin overdose and this was fairly recently in the past couple of months that have been seeing her. Nonetheless overall I feel like her wound looks significantly worse today compared to what it was previous. She still has significant erythema despite the Augmentin I am not sure that this is an appropriate medication for  her infection I am also concerned that the infection may have gone down into her bone. 02/19/19 on evaluation today patient actually appears to be doing about the same in regard to her toe ulcer. Unfortunately she continues to show signs of bone exposure and infection at this point. There does not appear to be any evidence of worsening of the infection but I'm also not really sure that it's getting significantly better. She is on the Augmentin which should be sufficient for the Staphylococcus aureus infection that she has at this point. With that being said she may need IV antibiotics to more appropriately treat this. We did have [name] discussion today about hyperbaric option therapy. 02/28/19 on evaluation today patient actually appears to be doing much worse in regard to the wound on her left great toe as compared to even my previous evaluation last week. Unfortunately this seems to be training in [name] pretty  poor direction. Her toe was actually now starting to angle laterally and I can actually see the entire joint area of the proximal portion of the digit where is the distal portion of the digit again is no longer even in contact with the joint line. Unfortunately there's [name] lot more necrotic tissue around the edge and the toe appears to be showing signs of becoming gangrenous in my pinion. I'm very concerned about were things stand at this point. She did see infectious disease and they are planning to send in [name] prescription for Sivextro for her and apparently this has been approved. With that being said I don't think she should avoid taking this but at the same time I'm not sure that it's gonna be sufficient to save her toe at this point. She tells me that she still having to care for grandparents which I think is putting quite [name] bit of strain on her foot and specifically the total area and has caused this to break down even to [name] greater degree than would've otherwise been expected. 03/05/19 on evaluation today patient actually appears to be doing quite well in regard to her toe all things considering. She still has bone exposed but there appears to be much less your thing on overall the appearance of the wound and the toe itself is dramatically improved. She still does have some issues currently obviously with infection she did see vascular as well and there concerned that her blood flow to the toad. For that reason they are setting up for an angiogram next week. 03/14/19 on evaluation today patient appears to be doing very poor in regard to her toe and specifically in regard to the ulceration and the fact that she's starting to notice the toe was leaning even more towards the lateral aspect and the complete joint is visible on the proximal aspect of the joint. Nonetheless she's also noted [name] significant odor and the tip of the toe is turning more dark and necrotic appearing. Overall I think she is getting  worse not better as far as this is concerned. For that reason I am recommending at this point that she likely needs to be seen for likely amputation. READMISSION 03/19/2021 This is [name] patient that we cared for in this clinic for [name] prolonged period of time in 2019 and 2020 with [name] left foot and left first toe wound. 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 Luana [name]. In December 2021 she  had an ulcer on her right great toe as well as the fourth and fifth toes. She underwent [name] partial ray amputation of the right fourth and fifth toes. She also had an angiogram at that time and underwent angioplasty of the right anterior tibial artery. In any case she claims that the wound on the right foot is closed I did not look at this today which was probably an oversight although I think that should be done next week. After her surgery she developed [name] dehiscence but I do not see any follow-up of this. According to Dr. Deborra Medina last review that she was out of the area being cared for by another physician but recently came back to his attention. 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 [name] culture of this area showed E. coli apparently before she came back to see Dr. Luana [name] she was supposed to be receiving antibiotics but she did not really take them. Nor is she offloading this area at all. Finally her last hemoglobin A1c listed in epic was in March 2022 at 14.1 she says things are [name] lot better since then although I am not sure. 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 She had this wound at the time. During this admission she had E. coli sepsis [name] TEE was negative. 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 She is still [name] heavy cigarette smoker. She has [name] history of bipolar disorder seizure disorder ABI in our clinic was 1.05 6/1; left midfoot in the setting of [name] TMA done previously. Round circular wound with [name] "knuckle" of protruding tissue. The problem is that the knuckle was not attached to any of the surrounding granulation and this probed proximally widely I removed [name] large portion of this tissue. This wound goes with considerable undermining laterally. I do not feel any bone there was no purulence but this is [name] deep wound. 6/8; in spite of the debridement I did last week. She arrives with [name] wound looking exactly the same. [name] protruding "knuckle" of tissue nonadherent to most of the surrounding tissue. There is considerable depth around this from 6-12 o'clock at 2.7 cm and undermining of 1 cm. This does not look overtly infected and the x- ray I did last week was negative for any osseous abnormalities. We have been using silver collagen 6/15; deep tissue culture I did last week showed moderate staph aureus and moderate Pseudomonas. This will definitely require prolonged antibiotic therapy. The pathology on the protuberant area was negative for malignancy fungus etc. 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 We have been using silver collagen. I am going to be prescribing Levaquin for [address] weeks[address] scan of the foot is down for 7/5 6/22; CT scan of the foot on 7 5. She says she has hardware in the left leg from her previous fracture. She is on the Levaquin for the deep tissue culture I did that showed methicillin sensitive staph aureus and Pseudomonas. I gave her [name] 2-week supply and she will have another week. She arrives in clinic today with the same protuberant tissue however this  is nonadherent to the tissue surrounding it. I am really at  [name] loss to explain this unless there is underlying deep tissue infection 6/29; patient presents for 1 week follow-up. She has been using collagen to the wound bed. She reports taking her antibiotics as prescribed.She has no complaints or issues today. She denies signs of infection. 7/6; patient presents for one week followup. She has been using collagen to the wound bed. She states she is taking Levaquin however at times she is not able to keep it down. She denies signs of infection. 7/13; patient presents for 1 week follow-up. She has been using silver alginate to the wound bed. She still has nausea when taking her antibiotics. She denies signs of infection. 7/20; patient presents for 1 week follow-up. She has been using silver alginate with gentamicin cream to the wound bed. She denies any issues and has no complaints today. She denies signs of infection. 7/27; patient presents for 1 week follow-up. She continues to use silver alginate with gentamicin cream to the wound bed. She reports starting her antibiotics. She has no issues or complaints. Overall she reports stability to the wound. 8/3; patient presents for 1 week follow-up. She has been using silver alginate with gentamicin cream to the wound bed. She reports completing all antibiotics. She has no issues or complaints today. She denies signs of infection. 8/17; patient presents for 2-week follow-up. He is to use silver alginate to the wound bed. She has no issues or complaints today. She denies signs of infection. She reports her pain has improved in her foot since last clinic visit 8/24; patient presents for 1 week follow-up. She continues to use silver alginate to the wound bed. She has no issues or complaints. She denies signs of infection. Pain is stable. 9/7; patient presents for follow-up. She missed her last week appointment due to feeling ill. She continues to use silver  alginate. She has [name] new wound to the right lower extremity that is covered in eschar. She states It occurred over the past week and has no idea how it started. She currently denies signs of infection. 9/14; patient presents for follow-up. T the left foot wound she has been using gentamicin cream and silver alginate. T the right lower extremity wound she has o o been keeping this covered and has not obtain Santyl. 9/21; patient presents for follow-up. She reports using gentamicin cream and silver alginate to the left foot and Santyl to the right lower extremity wound. She has no issues or complaints today. She denies signs of infection. 9/28; patient presents for follow-up. She reports [name] new wound to her right heel. She states this occurred [name] few days ago and is progressively gotten worse. She has been trying to clean the area with [name] Q-tip and Santyl. She reports stability in the other 2 wounds. She has been using gentamicin cream and silver alginate to the left foot and Santyl to the right lower extremity wound. Heather [name], Heather [name] ([id number]) [id number] Page 7 of 18 10/12; patient presents for follow-up. She reports improvement to the wound beds. She is seeing vein and vascular to discuss the potential of [name] left BKA. She states they are going to do an arteriogram. She continues to use silver alginate with dressing changes to her wounds. 11/2; patient presents for follow-up. She states she has not been doing dressing changes to the wound beds. She states she is not able to offload the areas. She reports chronic pain to her left foot wound. 11/9; patient presents for follow-up. She came in with only  socks on. She states she forgot to put on shoes. It is unclear if she is doing any dressing changes. She currently denies systemic signs of infection. 11/16; patient presents for follow-up. She came again only with socks on. She states she does not wear shoes ever. It is unclear  if she does dressing changes. She currently denies systemic signs of infection. 11/23; patient presents for follow-up. She wore her shoes today. It still unclear exactly what dressing she is using for each wound but she did states she obtained Dakin's solution and has been using this to the left foot wound. She currently denies signs of infection. 11/30; patient presents for follow-up. She has no issues or complaints today. She currently denies signs of infection. 12/7; patient presents for follow-up. She has no issues or complaints today. She has been using Hydrofera Blue to the right heel wound and Dakin solution to the left foot wound. Her right anterior leg wound is healed. She currently denies signs of infection. 12/14; patient presents for follow-up. She has been using Hydrofera Blue to the right heel and Dakin's to the left foot wounds. She has no issues or complaints today. She denies signs of infection. 12/21; patient presents for follow-up. She reports using Hydrofera Blue to the right heel and Dakin's to the left foot wound. She denies signs of infection. 12/28; patient presents for follow-up. She continues to use Dakin's to the left foot wound and Hydrofera Blue to the right heel wound. She denies signs of infection. 1/4; patient presents for follow-up. She has no issues or complaints today. She denies signs of infection. 1/11; patient presents for follow-up. It is unclear if she has been dressing these wounds over the past week. She currently denies signs of infection. 1/18; patient presents for follow-up. She states she has been using Dakin's wet-to-dry dressings to the left foot. She has been using Hydrofera Blue to the right foot foot wound. She states that the anterior right leg wound has reopened and draining serous fluid. She denies signs of infection. 1/25; patient presents for follow-up. She has no issues or complaints today. 2/1; patient presents for follow-up. She has no  issues or complaints today. She denies signs of infection. 2/8; patient presents for follow-up. She has lost her surgical shoes. She did not have [name] dressing to the right heel wound. She currently denies signs of infection. 2/15; patient presents for follow-up. She reports more pain to the right heel today. She denies purulent drainage Or fever/chills 2/22; patient presents for follow-up. She reports taking clindamycin over the past week. She states that she continues to have pain to her right heel. She reports purulent drainage. Readmission 03/16/2022 Ms. Arnetia Bronk is [name] 46 year old female with [name] past medical history of type 2 diabetes, osteomyelitis to her feet, chronic systolic heart failure and bipolar disorder that presents to the clinic for bilateral feet wounds and right lower extremity wound. She was last seen in our clinic on 12/15/2021. At that time she had purulent drainage coming out of her right plantar foot and I recommended she go to the ED. She states she went to [facility] and has been there for the past 3 months. I cannot see the records. She states she had OR debridement and was on several weeks of IV antibiotics while inpatient. Since discharge she has not been taking care of the wound beds. She had nothing on her feet other than socks today. She currently denies signs of infection. 5/31; patient presents  for follow-up. She has been using Dakin's wet-to-dry dressings to the wound beds on her feet bilaterally and antibiotic ointment to the right anterior leg wound. She had [name] wound culture done at last clinic visit that showed moderate Pseudomonas aeruginosa sensitive to ciprofloxacin. She currently denies systemic signs of infection. 6/14; patient presents for follow-up. She received Keystone 5 days ago and has been using this on the wound beds. She states that last week she had to go to the hospital because she had increased warmth and erythema to the right foot. She  was started on 2 oral antibiotics. She states she has been taking these. She currently denies systemic signs of infection. She has no issues or complaints today. 6/21; patient presents for follow-up. She states she has been using Keystone antibiotics to the wound beds. She has no issues or complaints today. She denies signs of infection. 6/28; patient presents for follow-up. She has been using Keystone antibiotics to the wound beds. She has no issues or complaints today. 7/12; patient presents for follow-up. Has been using Keystone antibiotics to the wound beds with calcium alginate. She has no issues or complaints today. She never followed up with her orthopedic surgeon who did the OR debridement to the right foot. We discussed the total contact cast for the left foot and patient would like to do this next week. 7/19; patient presents for follow-up. She has been using Keystone antibiotics with calcium alginate to the wound beds. She has no issues or complaints today. Patient is in agreement to do the total contact cast of the left foot today. She knows to return later this week for the obligatory cast change. 05-13-2022 upon evaluation today patient's wound which she has the cast of the left leg actually appears to be doing significantly better. Fortunately I do not see any signs of active infection locally or systemically which is great news and overall I am extremely pleased with where we stand currently. 7/26; patient presents for follow-up. She has [name] cast in place for the past week. She states it irritated her shin. Other than that she tolerated the cast well. She states she would like [name] break for 1 week from the cast. We have been using Keystone antibiotic and Aquacel to both wound beds. She denies signs of infection. 8/2; patient presents for follow-up. She has been using Keystone and Aquacel to the wound beds. She denies any issues and has no complaints. She is agreeable to have the cast  placed today for the left leg. 06-03-2022 upon evaluation today patient appears to be doing well with regard to her wound she saw some signs of improvement which is great news. Fortunately I do not see any evidence of active infection locally or systemically at this time which is great news. No fevers, chills, nausea, vomiting, or diarrhea. 8/16; patient presents for follow-up. She has no issues or complaints today. We have been using Keystone and Aquacel to the wound beds. The left lower extremity is in [name] total contact cast. She is tolerated this well. 8/23; patient presents for follow-up. She has had the total contact cast on the left leg for the past week. Unfortunately this has rubbed and broken down the [place], [place] [name] ([id number]) [id number] Page 8 of 18 skin to the medial foot. She currently denies signs of infection. She has been using Keystone antibiotic to the right plantar foot wound. 8/30; patient presents for follow-up. We have held off on the total contact cast for the left  leg for the past week. Her wound on the left foot has improved and the previous surrounding breakdown of skin has epithelialized. She has been using Keystone antibiotic to both wound beds. She has no issues or complaints today. She denies signs of infection. 9/6; patient presents for follow-up. She has ordered her's Keystone antibiotic and this is arriving this week. She has been doing Dakin's wet-to-dry dressings to the wound beds. She denies signs of infection. She is agreeable to the total contact cast today. 9/13; patient presents for follow-up. She states that the cast caused her left leg shin to be sore. She would like to take [name] break from the cast this week. She has been using Keystone antibiotic to the right plantar foot wound. She denies signs of infection. 9/20; patient presents for follow-up. She has been using Keystone antibiotics to the wound beds with calcium alginate to the  right foot wound and Hydrofera Blue to the left foot wound. She is agreeable to having the cast placed today. She has been approved for Apligraf and we will order this for next clinic visit. 9/27; patient presents for follow-up. We have been using Keystone antibiotic with Hydrofera Blue to the left foot wound under [name] total contact cast. T the right o foot wound she has been using Keystone antibiotic and calcium alginate. She declines [name] total contact cast today. Apligraf is available for placement and she would like to proceed with this. 07-28-2022 upon evaluation today patient appears to be doing well currently in regard to her wound. She is actually showing signs of significant improvement which is great news. Fortunately I do not see any evidence of active infection locally nor systemically at this time. She has been seeing Dr. Heber Collingdale and to be honest has been doing very well with the cast. Subsequently she comes in today with [name] cast on and we did reapply that today as well. She did not really want to she try to talk me out of that but I explained that if she wanted to heal this is really the right way to go. Patient voiced understanding. In regard to her right foot this is actually [name] lot better compared to the last time I saw her which is also great news. 10/11; patient presents for follow-up. Apligraf and the total contact cast was placed to the left leg at last clinic visit. She states that her right foot wound had burning pain to it with the placement of Apligraf to this area. She has been doing [place] over this area. She denies signs of infection including increased warmth, erythema or purulent drainage. 11/1; 3-week follow-up. The patient fortunately did not have [name] total contact cast or an Apligraf and on the left foot. She has been using Keystone ABD pads and kerlix and her own running shoes She arrives in clinic today with thick callus and [name] very poor surface on the left foot on the right  nonviable skin subcutaneous tissue and [name] deep probing hole. 11/15; patient missed her last clinic appointment. She states she has not been dressing the wound beds for the past 2 weeks. She states that at she had [name] new roommate but is now going back to live with her mother. Apparently its been [name] distracting 2 weeks. Patient currently denies signs of infection. Electronic Signature(s) Signed: 09/07/2022 2:25:25 PM By: Heather Shan DO Entered By: Heather [name] on [name] 13:37:45 -------------------------------------------------------------------------------- Physical Exam Details Patient Name: Date of Service: Heather [name], Yolette [name] [name] 10:30 [name]  Medical Record Number: [id number] Patient Account Number: [id number] Date of Birth/Sex: Treating RN: 1976/03/06 (46 y.o. Heather [name] Primary Care Provider: Raelene [name] Other Clinician: Massie [name] Referring Provider: Treating Provider/Extender: Linna Hoff Weeks in Treatment: 25 Constitutional . Cardiovascular . Psychiatric . Notes Right foot: T the plantar heel there is an incision site with increased depth. probes close to bone.T the opening there is granulation tissue, nonviable tissue o o and callus.Left foot: T the medial aspect there is an open wound with granulation tissue and nonviable tissue with surrounding callus. No overt signs of o infection to any of the wound beds. Electronic Signature(s) Signed: 09/07/2022 2:25:25 PM By: Higinio Roger ([id number]) [id number] Page 9 of 18 Entered By: Heather [name] on [name] 13:39:11 -------------------------------------------------------------------------------- Physician Orders Details Patient Name: Date of Service: Heather [name], GIESE [name] 10:30 [name]: [id number] Patient Account Number: [id number] Date of Birth/Sex: Treating RN: 12-20-1975 (46 y.o. Heather [name] Primary Care Provider: Raelene [name] Other Clinician: Massie [name] Referring Provider: Treating Provider/Extender: Heather [name] in Treatment: 25 Verbal / Phone Orders: No Diagnosis Coding Follow-up Appointments Return Appointment in 1 week. Nurse Visit as needed Bathing/ Shower/ Hygiene May shower with wound dressing protected with water repellent cover or cast protector. No tub bath. Anesthetic (Use 'Patient Medications' Section for Anesthetic Order Entry) Lidocaine applied to wound bed Edema Control - Lymphedema / Segmental Compressive Device / Other Elevate, Exercise Daily and [name] void Standing for Long Periods of Time. Elevate legs to the level of the heart and pump ankles as often as possible Elevate leg(s) parallel to the floor when sitting. Off-Loading Other: - keep pressure off of feet. Medications-Please add to medication list. Keystone Compound - Keystone gel right foot Wound Treatment Wound #11 - Foot Wound Laterality: Plantar, Right Cleanser: Dakin 16 (oz) 0.25 1 x Per Day/30 Days Discharge Instructions: Use as directed. Cleanser: Soap and Water 1 x Per Day/30 Days Discharge Instructions: Gently cleanse wound with antibacterial soap, rinse and pat dry prior to dressing wounds Topical: keystone gel 1 x Per Day/30 Days Prim Dressing: Aquacel Extra Hydrofiber Dressing, 2x2 (in/in) ary 1 x Per Day/30 Days Secondary Dressing: ABD Pad 5x9 (in/in) (Generic) 1 x Per Day/30 Days Discharge Instructions: Cover with ABD pad Secured With: Medipore T - 86M Medipore H Soft Cloth Surgical T ape ape, 2x2 (in/yd) (Generic) 1 x Per Day/30 Days Secured With: Kerlix Roll Sterile or Non-Sterile 6-ply 4.5x4 (yd/yd) (Generic) 1 x Per Day/30 Days Discharge Instructions: Apply Kerlix as directed Wound #12 - Foot Wound Laterality: Plantar, Left, Medial Cleanser: Dakin 16 (oz) 0.25 (DME) (Dispense As Written) 1 x Per Day/30 Days Discharge Instructions: Use as  directed. Topical: Vitamin AandD Ointment, 4(oz) tube 1 x Per Day/30 Days Discharge Instructions: apply to red, broken down area Prim Dressing: Gauze ary 1 x Per Day/30 Days KEANDREA, TAPLEY [name] ([id number]) [id number] Page 10 of 18 Discharge Instructions: moisten with Dakins solution Secondary Dressing: ABD Pad 5x9 (in/in) (Generic) 1 x Per Day/30 Days Discharge Instructions: Cover with ABD pad Secured With: Medipore T - 86M Medipore H Soft Cloth Surgical T ape ape, 2x2 (in/yd) (Generic) 1 x Per Day/30 Days Secured With: Kerlix Roll Sterile or Non-Sterile 6-ply 4.5x4 (yd/yd) (Generic) 1 x Per Day/30 Days Discharge Instructions: Apply Kerlix as directed Electronic Signature(s) Signed: 09/07/2022 2:25:25 PM By: Heather Shan DO Entered By: Heather [name] on [name] 13:49:26 -------------------------------------------------------------------------------- Problem List Details Patient Name: Date  of Service: STARKEISHA, VANWINKLE [name] 10:30 [name]: [id number] Patient Account Number: [id number] Date of Birth/Sex: Treating RN: 06-24-1976 (46 y.o. 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 03/16/2022 No Yes E11.621 Type 2 diabetes mellitus with foot ulcer 03/16/2022 No Yes E11.42 Type 2 diabetes mellitus with diabetic polyneuropathy 03/16/2022 No Yes L97.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 03/16/2022 No Yes of skin Inactive Problems Resolved Problems Electronic  Signature(s) Signed: 09/07/2022 2:25:25 PM By: Heather Shan DO Entered By: Heather [name] on [name] 13:36:17 Heather [name] ([id number]) [id number] Page 11 of 18 -------------------------------------------------------------------------------- Progress Note Details Patient Name: Date of Service: JANNETT, SCHMALL [name] 10:30 [name]: [id number] Patient Account Number: [id number] Date of Birth/Sex: Treating RN: 09-05-1976 (46 y.o. Heather [name] Primary Care Provider: Raelene [name] Other Clinician: Massie [name] Referring Provider: Treating Provider/Extender: Heather [name] in Treatment: 25 Subjective Chief Complaint Information obtained from Patient 03/19/2021; patient referred by Dr. Luana [name] who has been looking after her left foot for quite [name] period of time for review of [name] nonhealing area in the left midfoot 03/12/2022; bilateral feet wounds and right lower extremity wound. History of Present Illness (HPI) 01/18/18-She is here for initial evaluation of the left great toe ulcer. She is [name] poor historian in regards to timeframe in detail. She states approximately 4 weeks ago she lacerated her toe on something in the house. She followed up with her primary care who placed her on Bactrim and ultimately [name] second dose of Bactrim prior to coming to wound clinic. She states she has been treating the toe with peroxide, Betadine and [name] Band-Aid. She did not check her blood sugar this morning but checked it yesterday morning it was 327; she is unaware of [name] recent A1c and there are no current records. She saw Dr. she would've orthopedics last week for an old injury to the left ankle, she states he did not see her toe, nor did she bring it to his attention. She smokes approximately 1 pack cigarettes [name] day. Her social situation is concerning, she arrives this morning with her mother who appears extremely intoxicated/under  the influence; her mother was asked to leave the room and be monitored by the patient's grandmother. The patient's aunt then accompanied the patient and the room throughout the rest of the appointment. We had [name] lengthy discussion regarding the deleterious effects of uncontrolled hyperglycemia and smoking as it relates to wound healing and overall health. She was strongly encouraged to decrease her smoking and get her diabetes under better control. She states she is currently on [name] diet and has cut down her [facility] consumption. The left toe is erythematous, macerated and slightly edematous with malodor present. The edema in her left foot is below her baseline, there is no erythema streaking. We will treat her with Santyl, doxycycline; we have ordered and xray, culture and provided [name] Peg assist surgical shoe and cultured the wound. 01/25/18-She is here in follow-up evaluation for [name] left great toe ulcer and presents with an abscess to her suprapubic area. She states her  blood sugars remain elevated, feeling "sick" and if levels are below 250, but she is trying. She has made no attempt to decrease her smoking stating that we "can't take away her food in her cigarettes". She has been compliant with offloading using the PEG assist you. She is using Santyl daily. the culture obtained last week grew staph aureus and Enterococcus faecalis; continues on the doxycycline and Augmentin was added on Monday. The suprapubic area has erythema, no femoral variation, purple discoloration, minimal induration, was accessed with [name] cotton tip applicator with sanguinopurulent drainage, this was cultured, I suspect the current antibiotic treatment will cover and we will not add anything to her current treatment plan. She was advised to go to urgent care or ER with any change in redness, induration or fever. 02/01/18-She is here in follow-up evaluation for left great toe ulcers and [name] new abdominal abscess from last week. She  was able to use packing until earlier this week, where she "forgot it was there". She states she was feeling ill with GI symptoms last week and was not taking her antibiotic. She states her glucose levels have been predominantly less than 200, with occasional levels between 200-250. She thinks this was contributing to her GI symptoms as they have resolved without intervention. There continues to be significant laceration to left toe, otherwise it clinically looks stable/improved. There is now less superficial opening to the lateral aspect of the great toe that was residual blister. We will transition to [facility] to all wounds, she will continue her Augmentin. If there is no change or deterioration next week for reculture. 02/08/18-She is here in follow-up evaluation for left great toe ulcer and abdominal ulcer. There is an improvement in both wounds. She has been wrapping her left toe with coban, not by our direction, which has created an area of discoloration to the medial aspect; she has been advised to NOT use coban secondary to her neuropathy. She states her glucose levels have been high over this last week ranging from 200-350, she continues to smoke. She admits to being less compliant with her offloading shoe. We will continue with same treatment plan and she will follow-up next week. 02/15/18-She is here in follow-up evaluation for left great toe ulcer and abdominal ulcer. The abdominal ulcer is epithelialized. The left great toe ulcer is improved and all injury from last week using the Coban wrap is resolved, the lateral ulcer is healed. She admits to noncompliance with wearing offloading shoe and admits to glucose levels being greater than 300 most of the week. She continues to smoke and expresses no desire to quit. There is one area medially that probes deeper than it has historically, erythema to the toe and dorsal foot has consistently waxed and waned. There is no overt signs of  cellulitis or infection but we will culture the wound for any occult infection given the new area of depth and erythema. We will hold off on sensitivities for initiation of antibiotic therapy. 02/22/18-She is here in follow up evaluation for left great toe ulcer. There is overall significant improvement in both wound appearance, erythema and edema with changes made last week. She was not initiated on antibiotic therapy. Culture obtained last week showed oxacillin sensitive staph aureus, sensitive to clindamycin. Clindamycin has been called into the pharmacy but she has been instructed to hold off on initiation secondary to overall clinical improvement and her history of antibiotic intolerance. She has been instructed to contact the clinic with any noted changes/deterioration  and the wound, erythema, edema and/or pain. She will follow-up next week. She continues to smoke and her glucose levels remain elevated >250; she admits to compliance with offloading shoe 03/01/18 on evaluation today patient appears to be doing fairly well in regard to her left first toe ulcer. She has been tolerating the dressing changes with the [facility] Dressing without complication and overall this has definitely showed signs of improvement according to records as well is what the patient tells me today. I'm very pleased in that regard. She is having no pain today 03/08/18 She is here for follow up evaluation of [name] left great toe ulcer. She remains non-compliant with glucose control and smoking cessation; glucose levels consistently >200. She states that she got new shoe inserts/peg assist. She admits to compliance with offloading. Since my last evaluation there is significant improvement. We will switch to prisma at this time and she will follow up next week. She is noted to be tachycardic at this appointment, heart rate 120s; she has [name] history of heart rate 70-130 according to our records. She admits to extreme agitation r/t  personal issues; she was advised to monitor her heartrate and contact her physician if it does not return to [name] more normal range (<100). She takes cardizem twice daily. 03/15/18-She is here in follow-up evaluation for left great toe ulcer. She remains noncompliant with glucose control and smoking cessation. She admits to compliance with wearing offloading shoe. The ulcer is improved/stable and we will continue with the same treatment plan and she will follow-up next week 03/22/18-She is here for evaluation for left great toe ulcer. There continues to be significant improvement despite recurrent hyperglycemia (over 500 yesterday) and she continues to smoke. She has been compliant with offloading and we will continue with same treatment plan and she will follow-up next week. 03/29/18-She is here for evaluation for left great toe ulcer. Despite continuing to smoke and uncontrolled diabetes she continues to improve. She is compliant AUDRIELLE, VANKUREN [name] ([id number]) [id number] Page 12 of 18 with offloading shoe. We will continue with the same treatment plan and she will follow-up next week 04/05/18- She is here in follow up evaluation for [name] left great toe ulcer; she presents with small pustule to left fifth toe (resembles ant bite). She admits to compliance with wearing offloading shoe; continues to smoke or have uncontrolled blood glucose control. There is more callus than usual with evidence of bleeding; she denies known trauma. 04/12/18-She is here for evaluation of left great toe ulcer. Despite noncompliance with glycemic control and smoking she continues to make improvement. She continues to wear offloading shoe. The pustule, that was identified last week, to the left fifth toe is resolved. She will follow-up in 2 weeks 05/03/18-she is seen in follow-up evaluation for [name] left great toe ulcer. She is compliant with offloading, otherwise noncompliant with glycemic control and smoking.  She has plateaued and there is minimal improvement noted. We will transition to [facility], replaced the insert to her surgical shoe and she will follow-up in one week 05/10/18- She is here in follow up evaluation for [name] left great toe ulcer. It appears stable despite measurement change. We will continue with same treatment plan and follow up next week. 05/24/18-She is seen in follow-up evaluation for [name] left great toe ulcer. She remains compliant with offloading, has made significant improvement in her diet, decreasing the amount of sugar/soda. She said her recent A1c was 10.9 which is lower than. She did see [name]  diabetic nutritionist/educator yesterday. She continues to smoke. We will continue with the same treatment plan and she'll follow-up next week. 05/31/18- She is seen in follow-up evaluation for left great toe ulcer. She continues to remain compliant with offloading, continues to make improvement in her diet, increasing her water and decreasing the amount of sugar/soda. She does continue to smoke with no desire to quit. We will apply Prisma to the depth and Hydrofera Blue over. We have not received insurance authorization for oasis. She will follow up next week. 06/07/18-She is seen in follow-up evaluation for left great toe ulcer. It has stalled according to today's measurements although base appears stable. She says she saw [name] diabetic educator yesterday; her average blood sugars are less than 300 which is an improvement for her. She continues to smoke and states "that's my next step" She continues with water over soda. We will order for xray, culture and reinstate ace wrap compression prior to placing apligraf for next week. She is voicing no complaints or concerns. Her dressing will change to iodoflex over the next week in preparation for apligraf. 06/14/18-She is seen in follow-up evaluation for left great toe ulcer. Plain film x-ray performed last week was negative for osteomyelitis. Wound  culture obtained last week grew strep B and OSSA; she is initiated on keflex and cefdinir today; there is erythema to the toe which could be from ace wrap compression, she has [name] history of wrapping too tight and has has been encouraged to maintain ace wraps that we place today. We will hold off on application of apligraf today, will apply next week after antibiotic therapy has been initiated. She admits today that she has resumed taking [name] shower with her foot/toe submerged in water, she has been reminded to keep foot/toe out of the bath water. She will be seen in follow up next week 06/21/18-she is seen in follow-up evaluation for left great toe ulcer. She is tolerating antibiotic therapy with no GI disturbance. The wound is stable. Apligraf was applied today. She has been decreasing her smoking, only had 4 cigarettes yesterday and 1 today. She continues being more compliant in diabetic diet. She will follow-up next week for evaluation of site, if stable will remove at 2 weeks. 06/28/18- She is here in follow up evalution. Apligraf was placed last week, she states the dressing fell off on Tuesday and she was dressing with hydrofera blue. She is healed and will be discharged from the clinic today. She has been instructed to continue with smoking cessation, continue monitoring glucose levels, offloading for an additional 4 weeks and continue with hydrofera blue for additional two weeks for any possible microscopic opening. Readmission: 08/07/18 on evaluation today patient presents for reevaluation concerning the ulcer of her right great toe. She was previously discharged on 06/28/18 healed. Nonetheless she states that this began to show signs of drainage she subsequently went to her primary care provider. Subsequently an x-ray was performed on 08/01/18 which was negative. The patient was also placed on antibiotics at that time. Fortunately they should have been effective for the infection. Nonetheless she's  been experiencing some improvement but still has [name] lot of drainage coming from the wound itself. 08/14/18 on evaluation today patient's wound actually does show signs of improvement in regard to the erythema at this point. She has completed the antibiotics. With that being said we did discuss the possibility of placing her in [name] total contact cast as of today although I think that I may want  to give this just [name] little bit more time to ensure nothing recurrence as far as her infection is concerned. I do not want to put in the cast and risk infection at that time if things are not completely resolved. With that being said she is gonna require some debridement today. 08/21/18 on evaluation today patient actually appears to be doing okay in regard to her toe ulcer. She's been tolerating the dressing changes without complication. With that being said it does appear that she is ready and in fact I think it's appropriate for [place] to go ahead and initiate the total contact cast today. Nonetheless she will require some sharp debridement to prepare the wound for application. Overall I feel like things have been progressing well but we do need to do something to get this to close more readily. 08/24/18 patient seen today for reevaluation after having had the total contact cast applied on Tuesday. She seems to have done very well the wound appears to be doing great and overall I'm pleased with the progress that she's made. There were no abnormal areas of rubbing from the cast on her lower extremity. 08/30/18 on evaluation today patient actually appears to be completely healed in regard to her plantar toe ulcer. She tells me at this point she's been having [name] lot of issues with the cast. She almost fell [name] couple of times the state shall the step of her dog [name] couple times as well. This is been [name] very frustrating process for her other nonetheless she has completely healed the wound which is excellent news. Overall there  does not appear to be the evidence of infection at this time which is great news. 09/11/18 evaluation today patient presents for follow-up concerning her great toe ulcer on the left which has unfortunately reopened since I last saw her which was only [name] couple of weeks ago. Unfortunately she was not able to get in to get the shoe and potentially the AFO that's gonna be necessary due to her left foot drop. She continues with offloading shoe but this is not enough to prevent her from reopening it appears. When we last had her in the total contact cast she did well from [name] healing standpoint but unfortunately the wound reopened as soon as she came out of the cast within just [name] couple of weeks. 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 This is also part of the reason why she falls on [name] regular basis. I just do not believe that is gonna be safe for [place] to reinitiate the total contact cast as last time we had this on she fell 3 times one week which is definitely not normal for her. 09/18/18 upon evaluation today the patient actually appears to be doing about the same in regard to her toe ulcer. She did not contact [facility] as I asked her to even though I had given her the prescription. In fact she actually states that she has no idea where the prescription is. She did apparently call [facility] and they told her that all she needed to do was bring the prescription in order to be able to be seen and work on getting the AFO for her left foot. With all that being said she still does not have  an appointment and I'm not sure were things stand that regard. I will give her [name] new prescription today in order to contact them to get this set up. 09/25/18 on evaluation today patient actually appears to be doing about the same in  regard to her toes ulcer. She does have [name] small areas which seems to have [name] lot of callous buildup around the edge of the wound which is going to need sharp debridement today. She still is waiting to be scheduled for evaluation with [facility] for possibility of an AFO. She states there supposed to call her tomorrow to get this set up. Unfortunately it does appear that her foot specifically the toe area is showing signs of erythema. There does not appear to be any systemic infection which is in these good news. 10/02/18 on evaluation today patient actually appears to be doing about the same in regard to her toe ulcer. This really has not done too well although it's not significantly larger it's also not significantly smaller. She has been tolerating the dressing changes without complication. She actually has her appointment with [facility] and [facility] tomorrow to hopefully be measured for obtaining and AFO splint. I think this would be helpful preventing this from reoccurring. We had contemplated starting the cast this week although to be honest I am reluctant to do that as she's been having nausea, vomiting, and seizure activity over the past three days. She has [name] history of seizures and have been told is nothing that can be done for these. With that being said I do believe that along with the seizures have the nausea vomiting which upon further questioning doesn't seem to be the normal for her and makes me concerned for the possibility of infection or something else going on. I discussed this with the patient and her mother during the office visit today. I do not feel the wound is effective but maybe something else. The responses this was "this just happens to her at times and we don't know why". They did not seem to be interested in going to the hospital to have this checked out further. 10/09/18 on evaluation today patient presents for follow-up concerning her ongoing toe ulcer. She has been tolerating  the dressing changes without complication. Fortunately there does not appear to be any evidence of infection which is great news however I do think that the patient would benefit from going ahead for with the total contact cast. She's actually in [name] wheelchair today she tells me that she will use her walker if we initiate the cast. I was very specific about the fact that if we were gonna do the cast I wanted to make sure that she was using the walker in order to prevent any falls. She tells me she NOE, PITTSLEY [name] ([id number]) [id number] Page 13 of 18 does not have stairs that she has to traverse on [name] regular basis at her home. She has not had any seizures since last week again that something that happens to her often she tells me she did talk to [employer] and they said that it may take up to three weeks to get the brace approved for her. Hopefully that will not take that long but nonetheless in the meantime I do think the cast could be of benefit. 10/12/18 on evaluation today patient appears to be doing rather well in regard to her toe ulcer. It's just been [name] few days and already this is significantly improved both as far as overall appearance  and size. Fortunately there's no sign of infection. She is here for her first obligatory cast change. 10/19/18 Seen today for follow up and management of left great toe ulcer. Wound continues to show improvement. Noted small open area with seroussang drainage with palpation. Denies any increased pain or recent fevers during visit. She will continue calcium alginate with offloading shoe. Denies any questions or concerns during visit. 10/26/18 on evaluation today patient appears to be doing about the same as when I last saw her in regard to her wound bed. Fortunately there does not appear to be any signs of infection. Unfortunately she continues to have [name] breakdown in regard to the toe region any time that she is not in the cast. It takes  almost no time at all for this to happen. Nonetheless she still has not heard anything from the brace being made by [employer] as to when exactly this will be available to her. Fortunately there is no signs of infection at this time. 10/30/18 on evaluation today patient presents for application of the total contact cast as we just received him this morning. Fortunately we are gonna be able to apply this to her today which is great news. She continues to have no significant pain which is good news. Overall I do feel like things have been improving while she was the cast is when she doesn't have [name] cast that things get worse. She still has not really heard anything from [facility] regarding her brace. 11/02/18 upon evaluation today patient's wound already appears to be doing significantly better which is good news. Fortunately there does not appear to be any signs of infection also good news. Overall I do think the total contact cast as before is helping to heal this area unfortunately it's just not gonna likely keep the area closed and healed without her getting her brace at least. Again the foot drop is [name] significant issue for her. 11/09/18 on evaluation today patient appears to be doing excellent in regard to her toe ulcer which in fact is completely healed. Fortunately we finally got the situation squared away with the paperwork which was needed to proceed with getting her brace approved by Medicaid. I have filled that out unfortunately that information has been sent to the orthopedic office that I worked at 2 1/2 years ago and not tired Current wound care measures. Fortunately she seems to be doing very well at this time. 11/23/18 on evaluation today patient appears to be doing More Poorly Compared to Last Time I Saw Her. At [facility] She Had Completely Healed. Currently she is continuing to have issues with reopening. She states that she just found out that the brace was approved through Medicaid now she just  has to go get measured in order to have this fitted for her and then made. Subsequently she does not have an appointment for this yet that is going to complicate things we obviously cannot put her back in the cast if we do not have everything measured because they're not gonna be able to measure her foot while she is in the cast. Unfortunately the other thing that I found out today as well is that she was in the hospital over the weekend due to having [name] heroin overdose. Obviously this is unfortunate and does have me somewhat worried as well. 11/30/18 on evaluation today patient's toe ulcer actually appears to be doing fairly well. The good news is she will be getting her brace in the shoes next  week on Wednesday. Hopefully we will be able to get this to heal without having to go back in the cast however she may need the cast in order to get the wound completely heal and then go from there. Fortunately there's no signs of infection at this time. 12/07/18 on evaluation today patient fortunately did receive her brace and she states she could tell this definitely makes her walk better. With that being said she's been having issues with her toe where she noticed yesterday there was [name] lot of tissue that was loosing off this appears to be much larger than what it was previous. She also states that her leg has been read putting much across the top of her foot just about the ankle although this seems to be receiving somewhat. The total area is still red and appears to be someone infected as best I can tell. She is previously taken Bactrim and that may be [name] good option for her today as well. We are gonna see what I wound culture shows as well and I think that this is definitely appropriate. With that being said outside of the culture I still need to initiate something in the interim and that's what I'm gonna go ahead and select Bactrim is [name] good option for her. 12/14/18 on evaluation today patient appears to be  doing better in regard to her left great toe ulcer as compared to last week's evaluation. There's still some erythema although this is significantly improved which is excellent news. Overall I do believe that she is making good progress is still gonna take some time before she is where I would like her to be from the standpoint of being able to place her back into the total contact cast. Hopefully we will be where we need to be by next week. 12/21/18 on evaluation today patient actually appears to be doing poorly in regard to her toe ulcer. She's been tolerating the dressing changes without complication. Fortunately there's no signs of systemic infection although she does have [name] lot of drainage from the toe ulcer and this does seem to be causing some issues at this point. She does have erythema on the distal portion of her toe that appears to be likely cellulitis. 12/28/18 on evaluation today patient actually appears to be doing [name] little better in my pinion in regard to her toe ulcer. With that being said she still does have some evidence of infection at this time and for her culture she had both E. coli as well as enterococcus as organisms noted on evaluation. For that reason I think that though the Keflex likely has treated the E. coli rather well this has really done nothing for the enterococcus. We are going to have to initiate treatment for this specifically. 01/04/19 on evaluation today patient's toe actually appears to be doing better from the standpoint of infection. She currently would like to see about putting the cash back on I think that this is appropriate as long as she takes care of it and keeps it from getting wet. She is gonna have some drainage we can definitely pass this up with Drawtex and alginate to try to prevent as much drainage as possible from causing the problems. With that being said I do want to at least try her with the cast between now and Tuesday. If there any issues we  can't continue to use it then I will discontinue the use of the cast at that point. 01/08/19 on evaluation today  patient actually appears to be doing very well as far as her foot ulcer specifically the great toe on the left is concerned. She did have an area of rubbing on the medial aspect of her left ankle which again is from the cast. Fortunately there's no signs of infection at this point in this appears to be [name] very slight skin breakdown. The patient tells me she felt it rubbing but didn't think it was that bad. Fortunately there is no signs of active infection at this time which is good news. No fevers, chills, nausea, or vomiting noted at this time. 01/15/19 on evaluation today patient actually appears to be doing well in regard to her toe ulcer. Again as previous she seems to do well and she has the cast on which indicates to me that during the time she doesn't have [name] cast on she's putting way too much pressure on this region. Obviously I think that's gonna be an issue as with the current national emergency concerning the Covid-19 Virus it has been recommended that we discontinue the use of total contact casting by the chief medical officer of our company, Dr. Simona Huh. The reasoning is that if [name] patient becomes sick and cannot come into have the cast removed they could not just leave this on for an additional two weeks. Obviously the hospitals also do not want to receive patient's who are sick into the emergency department to potentially contaminate the region and spread the Covid-19 Virus among other sick individuals within the hospital system. Therefore at this point we are suspending the use of total contact cast until the current emergency subsides. This was all discussed with the patient today as well. 01/22/19 on evaluation today patient's wound on her left great toe appears to be doing slightly worse than previously noted last week. She tells me that she has been on this quite [name] bit in fact  she tells me she's been awake for 38 straight hours. This is due to the fact that she's having to care for grandparents because nobody else will. She has been taking care of them for five the last seven days since I've seen her they both have dementia his is from [name] stroke and her grandmother's was progressive. Nonetheless she states even her mom who knows her condition and situation has only help two of those days to take care of them she's been taking care of the rest. Fortunately there does not appear to be any signs of active infection in regard to her toe at this point although obviously it doesn't look as good as it did previous. I think this is directly related to her not taking off the pressure and friction by way of taking things easy. Though I completely understand what's going on. 01/29/19 on evaluation today patient's tools are actually appears to be showing some signs of improvement today compared to last week's evaluation as far as not necessarily the overall size of the wound but the fact that she has some new skin growth in between the two ends of the wound opening. Overall I feel like she has done well she states that she had [name] family member give her what sounds to be [name] CAM walker boot which has been helpful as well. 02/05/19 on evaluation today patient's wound bed actually appears to be doing significantly better in regard to her overall appearance of the size of the wound. RAINIE, CRENSHAW [name] ([id number]) [id number] Page 14 of 18 With that being said she is  still having an issue with offloading efficiently enough to get this to close. Apparently there is some signs of infection at this point as well unfortunately. Previously she's done well of Augmentin I really do not see anything that needs to be culture currently but there theme and cellulitis of the foot that I'm seeing I'm gonna go ahead and place her on an antibiotic today to try to help clear this  up. 02/12/2019 on evaluation today patient actually appears to be doing poorly in regard to her overall wound status. She tells me she has been using her offloading shoe but actually comes in today wearing her tennis shoe with the AFO brace. Again as I previously discussed with her this is really not sufficient to allow the area to heal appropriately. Nonetheless she continues to be somewhat noncompliant and I do wonder based on what she has told my nurse in the past as to whether or not [name] good portion of this noncompliance may be recreational drug and alcohol related. She has had [name] history of heroin overdose and this was fairly recently in the past couple of months that have been seeing her. Nonetheless overall I feel like her wound looks significantly worse today compared to what it was previous. She still has significant erythema despite the Augmentin I am not sure that this is an appropriate medication for her infection I am also concerned that the infection may have gone down into her bone. 02/19/19 on evaluation today patient actually appears to be doing about the same in regard to her toe ulcer. Unfortunately she continues to show signs of bone exposure and infection at this point. There does not appear to be any evidence of worsening of the infection but I'm also not really sure that it's getting significantly better. She is on the Augmentin which should be sufficient for the Staphylococcus aureus infection that she has at this point. With that being said she may need IV antibiotics to more appropriately treat this. We did have [name] discussion today about hyperbaric option therapy. 02/28/19 on evaluation today patient actually appears to be doing much worse in regard to the wound on her left great toe as compared to even my previous evaluation last week. Unfortunately this seems to be training in [name] pretty poor direction. Her toe was actually now starting to angle laterally and I can actually see the  entire joint area of the proximal portion of the digit where is the distal portion of the digit again is no longer even in contact with the joint line. Unfortunately there's [name] lot more necrotic tissue around the edge and the toe appears to be showing signs of becoming gangrenous in my pinion. I'm very concerned about were things stand at this point. She did see infectious disease and they are planning to send in [name] prescription for Sivextro for her and apparently this has been approved. With that being said I don't think she should avoid taking this but at the same time I'm not sure that it's gonna be sufficient to save her toe at this point. She tells me that she still having to care for grandparents which I think is putting quite [name] bit of strain on her foot and specifically the total area and has caused this to break down even to [name] greater degree than would've otherwise been expected. 03/05/19 on evaluation today patient actually appears to be doing quite well in regard to her toe all things considering. She still has bone exposed but  there appears to be much less your thing on overall the appearance of the wound and the toe itself is dramatically improved. She still does have some issues currently obviously with infection she did see vascular as well and there concerned that her blood flow to the toad. For that reason they are setting up for an angiogram next week. 03/14/19 on evaluation today patient appears to be doing very poor in regard to her toe and specifically in regard to the ulceration and the fact that she's starting to notice the toe was leaning even more towards the lateral aspect and the complete joint is visible on the proximal aspect of the joint. Nonetheless she's also noted [name] significant odor and the tip of the toe is turning more dark and necrotic appearing. Overall I think she is getting worse not better as far as this is concerned. For that reason I am recommending at this point  that she likely needs to be seen for likely amputation. READMISSION 03/19/2021 This is [name] patient that we cared for in this clinic for [name] prolonged period of time in 2019 and 2020 with [name] left foot and left first toe wound. 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 Luana [name]. In December 2021 she had an ulcer on her right great toe as well as the fourth and fifth toes. She underwent [name] partial ray amputation of the right fourth and fifth toes. She also had an angiogram at that time and underwent angioplasty of the right anterior tibial artery. In any case she claims that the wound on the right foot is closed I did not look at this today which was probably an oversight although I think that should be done next week. After her surgery she developed [name] dehiscence but I do not see any follow-up of this. According to Dr. Deborra Medina last review that she was out of the area being cared for by another physician but recently came back to his attention. 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 [name] culture of this area showed E. coli apparently before she came back to see Dr. Luana [name] she was supposed to be receiving antibiotics but she did not really take them. Nor is she offloading this area at all. Finally her last hemoglobin A1c listed in epic was in March 2022 at 14.1 she says things are [name] lot better since then although I am not sure. 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 She had this wound at the time. During this admission she had E. coli sepsis [name] TEE was negative. Past medical history is extensive and includes type 2 diabetes with peripheral neuropathy cardiomyopathy with an ejection fraction of 33%, hypertension, hyperlipidemia chronic renal failure stage III history of substance abuse with  cocaine although she claims to be clean now verified by her mother. She is still [name] heavy cigarette smoker. She has [name] history of bipolar disorder seizure disorder ABI in our clinic was 1.05 6/1; left midfoot in the setting of [name] TMA done previously. Round circular wound with [name] "knuckle" of protruding tissue. The problem is that the knuckle was not attached to any of the surrounding granulation and this probed proximally widely I removed [name] large portion of this tissue. This wound goes with considerable undermining laterally. I do not feel any bone there was  no purulence but this is [name] deep wound. 6/8; in spite of the debridement I did last week. She arrives with [name] wound looking exactly the same. [name] protruding "knuckle" of tissue nonadherent to most of the surrounding tissue. There is considerable depth around this from 6-12 o'clock at 2.7 cm and undermining of 1 cm. This does not look overtly infected and the x- ray I did last week was negative for any osseous abnormalities. We have been using silver collagen 6/15; deep tissue culture I did last week showed moderate staph aureus and moderate Pseudomonas. This will definitely require prolonged antibiotic therapy. The pathology on the protuberant area was negative for malignancy fungus etc. 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 We have been using silver collagen. I am going to be prescribing Levaquin for 2 weeks. Her CT scan of the foot is down for 7/5 6/22; CT scan of the foot on 7 5. She says she has hardware in the left leg from her previous fracture. She is on the Levaquin for the deep tissue culture I did that showed methicillin sensitive staph aureus and Pseudomonas. I gave her [name] 2-week supply and she will have another week. She arrives in clinic today with the same protuberant tissue however this is nonadherent to the tissue surrounding it. I am really at [name] loss to explain this unless  there is underlying deep tissue infection 6/29; patient presents for 1 week follow-up. She has been using collagen to the wound bed. She reports taking her antibiotics as prescribed.She has no complaints or issues today. She denies signs of infection. 7/6; patient presents for one week followup. She has been using collagen to the wound bed. She states she is taking Levaquin however at times she is not able to keep it down. She denies signs of infection. 7/13; patient presents for 1 week follow-up. She has been using silver alginate to the wound bed. She still has nausea when taking her antibiotics. She denies signs of infection. 7/20; patient presents for 1 week follow-up. She has been using silver alginate with gentamicin cream to the wound bed. She denies any issues and has no complaints today. She denies signs of infection. 7/27; patient presents for 1 week follow-up. She continues to use silver alginate with gentamicin cream to the wound bed. She reports starting her antibiotics. She has no issues or complaints. Overall she reports stability to the wound. 8/3; patient presents for 1 week follow-up. She has been using silver alginate with gentamicin cream to the wound bed. She reports completing all antibiotics. She has no issues or complaints today. She denies signs of infection. 8/17; patient presents for 2-week follow-up. He is to use silver alginate to the wound bed. She has no issues or complaints today. She denies signs of infection. She reports her pain has improved in her foot since last clinic visit 8/24; patient presents for 1 week follow-up. She continues to use silver alginate to the wound bed. She has no issues or complaints. She denies signs of infection. Pain is stable. 9/7; patient presents for follow-up. She missed her last week appointment due to feeling ill. She continues to use silver alginate. She has [name] new wound to the right lower extremity that is covered in eschar. She  states It occurred over the past week and has no idea how it started. She currently denies signs of infection. 9/14; patient presents for follow-up. T the left foot wound she has been  using gentamicin cream and silver alginate. T the right lower extremity wound she has crystina borrayo, Phelan [name] ([id number]) [id number] Page 15 of 18 been keeping this covered and has not obtain Santyl. 9/21; patient presents for follow-up. She reports using gentamicin cream and silver alginate to the left foot and Santyl to the right lower extremity wound. She has no issues or complaints today. She denies signs of infection. 9/28; patient presents for follow-up. She reports [name] new wound to her right heel. She states this occurred [name] few days ago and is progressively gotten worse. She has been trying to clean the area with [name] Q-tip and Santyl. She reports stability in the other 2 wounds. She has been using gentamicin cream and silver alginate to the left foot and Santyl to the right lower extremity wound. 10/12; patient presents for follow-up. She reports improvement to the wound beds. She is seeing vein and vascular to discuss the potential of [name] left BKA. She states they are going to do an arteriogram. She continues to use silver alginate with dressing changes to her wounds. 11/2; patient presents for follow-up. She states she has not been doing dressing changes to the wound beds. She states she is not able to offload the areas. She reports chronic pain to her left foot wound. 11/9; patient presents for follow-up. She came in with only socks on. She states she forgot to put on shoes. It is unclear if she is doing any dressing changes. She currently denies systemic signs of infection. 11/16; patient presents for follow-up. She came again only with socks on. She states she does not wear shoes ever. It is unclear if she does dressing changes. She currently denies systemic signs of infection. 11/23;  patient presents for follow-up. She wore her shoes today. It still unclear exactly what dressing she is using for each wound but she did states she obtained Dakin's solution and has been using this to the left foot wound. She currently denies signs of infection. 11/30; patient presents for follow-up. She has no issues or complaints today. She currently denies signs of infection. 12/7; patient presents for follow-up. She has no issues or complaints today. She has been using Hydrofera Blue to the right heel wound and Dakin solution to the left foot wound. Her right anterior leg wound is healed. She currently denies signs of infection. 12/14; patient presents for follow-up. She has been using Hydrofera Blue to the right heel and Dakin's to the left foot wounds. She has no issues or complaints today. She denies signs of infection. 12/21; patient presents for follow-up. She reports using Hydrofera Blue to the right heel and Dakin's to the left foot wound. She denies signs of infection. 12/28; patient presents for follow-up. She continues to use Dakin's to the left foot wound and Hydrofera Blue to the right heel wound. She denies signs of infection. 1/4; patient presents for follow-up. She has no issues or complaints today. She denies signs of infection. 1/11; patient presents for follow-up. It is unclear if she has been dressing these wounds over the past week. She currently denies signs of infection. 1/18; patient presents for follow-up. She states she has been using Dakin's wet-to-dry dressings to the left foot. She has been using Hydrofera Blue to the right foot foot wound. She states that the anterior right leg wound has reopened and draining serous fluid. She denies signs of infection. 1/25; patient presents for follow-up. She has no issues or complaints today. 2/1; patient presents for  follow-up. She has no issues or complaints today. She denies signs of infection. 2/8; patient presents for  follow-up. She has lost her surgical shoes. She did not have [name] dressing to the right heel wound. She currently denies signs of infection. 2/15; patient presents for follow-up. She reports more pain to the right heel today. She denies purulent drainage Or fever/chills 2/22; patient presents for follow-up. She reports taking clindamycin over the past week. She states that she continues to have pain to her right heel. She reports purulent drainage. Readmission 03/16/2022 Ms. Nasim Garofano is [name] 46 year old female with [name] past medical history of type 2 diabetes, osteomyelitis to her feet, chronic systolic heart failure and bipolar disorder that presents to the clinic for bilateral feet wounds and right lower extremity wound. She was last seen in our clinic on 12/15/2021. At that time she had purulent drainage coming out of her right plantar foot and I recommended she go to the ED. She states she went to [facility] and has been there for the past 3 months. I cannot see the records. She states she had OR debridement and was on several weeks of IV antibiotics while inpatient. Since discharge she has not been taking care of the wound beds. She had nothing on her feet other than socks today. She currently denies signs of infection. 5/31; patient presents for follow-up. She has been using Dakin's wet-to-dry dressings to the wound beds on her feet bilaterally and antibiotic ointment to the right anterior leg wound. She had [name] wound culture done at last clinic visit that showed moderate Pseudomonas aeruginosa sensitive to ciprofloxacin. She currently denies systemic signs of infection. 6/14; patient presents for follow-up. She received Keystone 5 days ago and has been using this on the wound beds. She states that last week she had to go to the hospital because she had increased warmth and erythema to the right foot. She was started on 2 oral antibiotics. She states she has been taking these. She  currently denies systemic signs of infection. She has no issues or complaints today. 6/21; patient presents for follow-up. She states she has been using Keystone antibiotics to the wound beds. She has no issues or complaints today. She denies signs of infection. 6/28; patient presents for follow-up. She has been using Keystone antibiotics to the wound beds. She has no issues or complaints today. 7/12; patient presents for follow-up. Has been using Keystone antibiotics to the wound beds with calcium alginate. She has no issues or complaints today. She never followed up with her orthopedic surgeon who did the OR debridement to the right foot. We discussed the total contact cast for the left foot and patient would like to do this next week. 7/19; patient presents for follow-up. She has been using Keystone antibiotics with calcium alginate to the wound beds. She has no issues or complaints today. Patient is in agreement to do the total contact cast of the left foot today. She knows to return later this week for the obligatory cast change. 05-13-2022 upon evaluation today patient's wound which she has the cast of the left leg actually appears to be doing significantly better. Fortunately I do not see any signs of active infection locally or systemically which is great news and overall I am extremely pleased with where we stand currently. 7/26; patient presents for follow-up. She has [name] cast in place for the past week. She states it irritated her shin. Other than that she tolerated the cast well. She  states she would like [name] break for 1 week from the cast. We have been using Keystone antibiotic and Aquacel to both wound beds. She denies signs of infection. 8/2; patient presents for follow-up. She has been using Keystone and Aquacel to the wound beds. She denies any issues and has no complaints. She is agreeable to have the cast placed today for the left leg. 06-03-2022 upon evaluation today patient appears to  be doing well with regard to her wound she saw some signs of improvement which is great news. KORY, PANJWANI [name] ([id number]) [id number] Page 16 of 18 Fortunately I do not see any evidence of active infection locally or systemically at this time which is great news. No fevers, chills, nausea, vomiting, or diarrhea. 8/16; patient presents for follow-up. She has no issues or complaints today. We have been using Keystone and Aquacel to the wound beds. The left lower extremity is in [name] total contact cast. She is tolerated this well. 8/23; patient presents for follow-up. She has had the total contact cast on the left leg for the past week. Unfortunately this has rubbed and broken down the skin to the medial foot. She currently denies signs of infection. She has been using Keystone antibiotic to the right plantar foot wound. 8/30; patient presents for follow-up. We have held off on the total contact cast for the left leg for the past week. Her wound on the left foot has improved and the previous surrounding breakdown of skin has epithelialized. She has been using Keystone antibiotic to both wound beds. She has no issues or complaints today. She denies signs of infection. 9/6; patient presents for follow-up. She has ordered her's Keystone antibiotic and this is arriving this week. She has been doing Dakin's wet-to-dry dressings to the wound beds. She denies signs of infection. She is agreeable to the total contact cast today. 9/13; patient presents for follow-up. She states that the cast caused her left leg shin to be sore. She would like to take [name] break from the cast this week. She has been using Keystone antibiotic to the right plantar foot wound. She denies signs of infection. 9/20; patient presents for follow-up. She has been using Keystone antibiotics to the wound beds with calcium alginate to the right foot wound and Hydrofera Blue to the left foot wound. She is agreeable to  having the cast placed today. She has been approved for Apligraf and we will order this for next clinic visit. 9/27; patient presents for follow-up. We have been using Keystone antibiotic with Hydrofera Blue to the left foot wound under [name] total contact cast. T the right o foot wound she has been using Keystone antibiotic and calcium alginate. She declines [name] total contact cast today. Apligraf is available for placement and she would like to proceed with this. 07-28-2022 upon evaluation today patient appears to be doing well currently in regard to her wound. She is actually showing signs of significant improvement which is great news. Fortunately I do not see any evidence of active infection locally nor systemically at this time. She has been seeing Dr. [place] and to be honest has been doing very well with the cast. Subsequently she comes in today with [name] cast on and we did reapply that today as well. She did not really want to she try to talk me out of that but I explained that if she wanted to heal this is really the right way to go. Patient voiced understanding. In regard to her right  foot this is actually [name] lot better compared to the last time I saw her which is also great news. 10/11; patient presents for follow-up. Apligraf and the total contact cast was placed to the left leg at last clinic visit. She states that her right foot wound had burning pain to it with the placement of Apligraf to this area. She has been doing [place] over this area. She denies signs of infection including increased warmth, erythema or purulent drainage. 11/1; 3-week follow-up. The patient fortunately did not have [name] total contact cast or an Apligraf and on the left foot. She has been using Keystone ABD pads and kerlix and her own running shoes She arrives in clinic today with thick callus and [name] very poor surface on the left foot on the right nonviable skin subcutaneous tissue and [name] deep probing hole. 11/15; patient  missed her last clinic appointment. She states she has not been dressing the wound beds for the past 2 weeks. She states that at she had [name] new roommate but is now going back to live with her mother. Apparently its been [name] distracting 2 weeks. Patient currently denies signs of infection. Objective Constitutional Vitals Time Taken: 11:07 AM, Height: 69 in, Weight: 178 lbs, BMI: 26.3, Temperature: 98.3 F, Pulse: 94 bpm, Respiratory Rate: 16 breaths/min, Blood Pressure: 142/85 mmHg. General Notes: Right foot: T the plantar heel there is an incision site with increased depth. probes close to bone.T the opening there is granulation tissue, o o nonviable tissue and callus.Left foot: T the medial aspect there is an open wound with granulation tissue and nonviable tissue with surrounding callus. No overt o signs of infection to any of the wound beds. Integumentary (Hair, Skin) Wound #11 status is Open. Original cause of wound was Surgical Injury. The date acquired was: 12/01/2021. The wound has been in treatment 25 weeks. The wound is located on the [place]. The wound measures 0.8cm length x 0.5cm width x 0.5cm depth; 0.314cm^2 area and 0.157cm^3 volume. There is Fat Layer (Subcutaneous Tissue) exposed. There is no tunneling or undermining noted. There is [name] medium amount of serosanguineous drainage noted. The wound margin is distinct with the outline attached to the wound base. There is medium (34-66%) pink, pale granulation within the wound bed. There is [name] medium (34-66%) amount of necrotic tissue within the wound bed. Wound #12 status is Open. Original cause of wound was Pressure Injury. The date acquired was: 03/16/2020. The wound has been in treatment 25 weeks. The wound is located on the [place]. The wound measures 1.9cm length x 0.9cm width x 0.2cm depth; 1.343cm^2 area and 0.269cm^3 volume. There is Fat Layer (Subcutaneous Tissue) exposed. There is no tunneling or  undermining noted. There is [name] medium amount of serous drainage noted. The wound margin is flat and intact. There is large (67-100%) red, pink granulation within the wound bed. There is no necrotic tissue within the wound bed. Assessment Active Problems ICD-10 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ity Non-pressure chronic ulcer of other part of right foot with fat layer exposed Type 2 diabetes mellitus with foot ulcer Type 2 diabetes mellitus with diabetic polyneuropathy Non-pressure chronic ulcer of other part of right lower leg limited to breakdown of skin Doolen, Sharni [name] ([id number]) [id number] Page 17 of 18 Unfortunately patient missed her last clinic appointment. The left foot wound had been doing very well and 1 month ago it appeared that it would be healed by  now with casting and skin substitute. Unfortunately patient has missed clinic appointments and declined the cast over the past month. We discussed the importance of aggressive offloading and wound care to help facilitate closure to the wound bed. I recommended weekly follow-up in our clinic. She is at high risk for bilateral BKA's. This was discussed with the patient today and she expressed understanding. I would like to cast her and put [name] skin substitute on the left foot next week. She needs [name] better surface for this to happen. I debrided nonviable tissue and recommended Dakin's wet-to-dry dressings. T the right o foot this appears stable however she has not been dressing any of the wound beds. I recommended going back to [facility] topical antibiotic and Aquacel. I debrided this area as well. Continue aggressive offloading. Follow-up in 1 week. Procedures Wound #11 Pre-procedure diagnosis of Wound #11 is an Open Surgical Wound located on the Right,Plantar Foot . There was [name] Excisional Skin/Subcutaneous Tissue Debridement with [name] total area of 1 sq cm performed by  Heather Shan, MD. With the following instrument(s): Curette to remove Viable and Non-Viable tissue/material. Material removed includes Subcutaneous Tissue and Slough and. [name] time out was conducted at 11:32, prior to the start of the procedure. [name] Minimum amount of bleeding was controlled with Pressure. The procedure was tolerated well. Post Debridement Measurements: 0.8cm length x 0.5cm width x 0.6cm depth; 0.188cm^3 volume. Character of Wound/Ulcer Post Debridement is stable. 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 There was [name] Excisional Skin/Subcutaneous Tissue Debridement with [name] total area of 5 sq cm performed by Heather Shan, MD. With the following instrument(s): Curette to remove Viable and Non-Viable tissue/material. Material removed includes Subcutaneous Tissue and Slough and. [name] time out was conducted at 11:24, prior to the start of the procedure. [name] Minimum amount of bleeding was controlled with Pressure. The procedure was tolerated well. Post Debridement Measurements: 1.9cm length x 0.9cm width x 0.3cm depth; 0.403cm^3 volume. Character of Wound/Ulcer Post Debridement is stable. Severity of Tissue Post Debridement is: Fat layer exposed. Post procedure Diagnosis Wound #12: Same as Pre-Procedure Plan Follow-up Appointments: Return Appointment in 1 week. Nurse Visit as needed Bathing/ Shower/ Hygiene: May shower with wound dressing protected with water repellent cover or cast protector. No tub bath. Anesthetic (Use 'Patient Medications' Section for Anesthetic Order Entry): Lidocaine applied to wound bed Edema Control - Lymphedema / Segmental Compressive Device / Other: Elevate, Exercise Daily and Avoid Standing for Long Periods of Time. Elevate legs to the level of the  heart and pump ankles as often as possible Elevate leg(s) parallel to the floor when sitting. Off-Loading: Other: - keep pressure off of feet. Medications-Please add to medication list.: [employer] - Keystone gel right foot WOUND #11: - Foot Wound Laterality: Plantar, Right Cleanser: Dakin 16 (oz) 0.25 1 x Per Day/30 Days Discharge Instructions: Use as directed. Cleanser: Soap and Water 1 x Per Day/30 Days Discharge Instructions: Gently cleanse wound with antibacterial soap, rinse and pat dry prior to dressing wounds Topical: keystone gel 1 x Per Day/30 Days Prim Dressing: Aquacel Extra Hydrofiber Dressing, 2x2 (in/in) 1 x Per Day/30 Days ary Secondary Dressing: ABD Pad 5x9 (in/in) (Generic) 1 x Per Day/30 Days Discharge Instructions: Cover with ABD pad Secured With: Medipore T - 60M Medipore H Soft Cloth Surgical T ape  ape, 2x2 (in/yd) (Generic) 1 x Per Day/30 Days Secured With: Kerlix Roll Sterile or Non-Sterile 6-ply 4.5x4 (yd/yd) (Generic) 1 x Per Day/30 Days Discharge Instructions: Apply Kerlix as directed WOUND #12: - Foot Wound Laterality: Plantar, Left, Medial Cleanser: Dakin 16 (oz) 0.25 (DME) (Dispense As Written) 1 x Per Day/30 Days Discharge Instructions: Use as directed. Topical: Vitamin AandD Ointment, 4(oz) tube 1 x Per Day/30 Days Discharge Instructions: apply to red, broken down area Prim Dressing: Gauze 1 x Per Day/30 Days ary Discharge Instructions: moisten with Dakins solution Secondary Dressing: ABD Pad 5x9 (in/in) (Generic) 1 x Per Day/30 Days Discharge Instructions: Cover with ABD pad Secured With: Medipore T - 40M Medipore H Soft Cloth Surgical T ape ape, 2x2 (in/yd) (Generic) 1 x Per Day/30 Days Secured With: Kerlix Roll Sterile or Non-Sterile 6-ply 4.5x4 (yd/yd) (Generic) 1 x Per Day/30 Days Discharge Instructions: Apply Kerlix as directed 1. In office sharp debridement 2. Dakin's wet-to-dry dressings 3. Keystone antibiotic and Aquacel 4. Follow-up  in 1 week Markovitz, Yuktha [name] ([id number]) [id number] Page 18 of 18 5. Aggressive offloadingoosurgical shoe and Pegasys 6. Order Apligraf for next clinic visit 7. T contact cast for next clinic visit otal Electronic Signature(s) Signed: 09/07/2022 2:25:25 PM By: Heather Shan DO Entered By: Heather [name] on [name] 13:49:05 -------------------------------------------------------------------------------- SuperBill Details Patient Name: Date of Service: Heather [name], Steffanie Dunn [name] [name] Medical Record Number: [id number] Patient Account Number: [id number] Date of Birth/Sex: Treating RN: 1976/09/24 (46 y.o. Heather [name] Primary Care Provider: Raelene [name] Other Clinician: Massie [name] Referring Provider: Treating Provider/Extender: Heather [name] in Treatment: 25 Diagnosis Coding ICD-10 Codes Code Description [id number] 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ity 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.621 Type 2 diabetes mellitus with foot ulcer E11.42 Type 2 diabetes mellitus with diabetic polyneuropathy L97.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 of skin Facility Procedures : CPT4 Code: [id number] Description: 58309 - DEB SUBQ TISSUE 20 SQ CM/< ICD-10 Diagnosis Description 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 seve L97.512 Non-pressure chronic ulcer of other part of right foot with fat layer exposed  E11.621 Type 2 diabetes mellitus with foot ulcer Modifier: rity Quantity: 1 Physician Procedures : CPT4 Code Description Modifier [id number] - WC PHYS SUBQ TISS 20 SQ CM ICD-10 Diagnosis Description 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ity L97.512 Non-pressure chronic ulcer of other part of right   foot with fat layer exposed E11.621 Type 2 diabetes mellitus with foot ulcer Quantity: 1 Electronic Signature(s) Signed: 09/07/2022 2:25:25 PM By: Heather Shan DO Entered By: Heather [name] on [name] 13:49:16

## 2022-09-14 ENCOUNTER — Encounter (HOSPITAL_BASED_OUTPATIENT_CLINIC_OR_DEPARTMENT_OTHER): Payer: Medicaid Other | Admitting: Internal Medicine

## 2022-09-14 DIAGNOSIS — L97528 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ity: Secondary | ICD-10-CM

## 2022-09-14 DIAGNOSIS — E11621 Type 2 diabetes mellitus with foot ulcer: Secondary | ICD-10-CM | POA: Diagnosis not present

## 2022-09-15 NOTE — Progress Notes (Signed)
[name], [name] ([id number]) [id number] Page 1 of 19 Visit Report for 09/14/2022 Chief Complaint Document Details Patient Name: Date of Service: [name], [name] 09/14/2022 10:30 [name]: [id number] Patient Account Number: [id number] Date of Birth/Sex: Treating RN: 1976-08-01 (46 y.o. [name] Primary Care Provider: Raelene [name] Other Clinician: Massie [name] Referring Provider: Treating Provider/Extender: [name] in Treatment: 26 Information Obtained from: Patient Chief Complaint 03/19/2021; patient referred by [name] who has been looking after her left foot for quite [name] period of time for review of [name] nonhealing area in the left midfoot 03/12/2022; bilateral feet wounds and right lower extremity wound. Electronic Signature(s) Signed: 09/14/2022 12:14:43 PM By: Heather Shan DO Entered By: [name] on 09/14/2022 11:34:35 -------------------------------------------------------------------------------- Debridement Details Patient Name: Date of Service: [name], [name] 09/14/2022 10:30 [name]: [id number] Patient Account Number: [id number] Date of Birth/Sex: Treating RN: 1975/12/24 (46 y.o. [name], [name] Primary Care Provider: Raelene [name] Other Clinician: Massie [name] Referring Provider: Treating Provider/Extender: [name] in Treatment: 26 Debridement Performed for Assessment: Wound #12 Left,Medial,Plantar Foot Performed By: Physician Heather Shan, MD Debridement Type: Debridement Severity of Tissue Pre Debridement: Fat layer exposed Level of Consciousness (Pre-procedure): Awake and Alert Pre-procedure Verification/Time Out Yes - 10:45 Taken: T Area Debrided (L x W): otal 2 (cm) x 1.2 (cm) = 2.4 (cm) Tissue and other material debrided: Viable, Non-Viable, Slough, Subcutaneous, Biofilm, Slough Level: Skin/Subcutaneous  Tissue Debridement Description: Excisional Instrument: Curette Bleeding: Minimum Hemostasis Achieved: Pressure Response to Treatment: Procedure was tolerated well Level of Consciousness (Post- Awake and Alert procedure): [name], [name] ([id number]) [id number] Page 2 of 19 Post Debridement Measurements of Total Wound Length: (cm) 2 Width: (cm) 1.2 Depth: (cm) 0.4 Volume: (cm) 0.754 Character of Wound/Ulcer Post Debridement: Stable Severity of Tissue Post Debridement: Fat layer exposed Post Procedure Diagnosis Same as Pre-procedure Electronic Signature(s) Signed: 09/14/2022 12:14:43 PM By: Heather Shan DO Signed: 09/14/2022 5:38:08 PM By: [name] Entered By: [name], BSN, RN, CWS, [name] on 09/14/2022 10:45:55 -------------------------------------------------------------------------------- HPI Details Patient Name: Date of Service: [name], [place] 09/14/2022 10:30 [name]: [id number] Patient Account Number: [id number] Date of Birth/Sex: Treating RN: 08-11-1976 (46 y.o. [name] Primary Care Provider: Raelene [name] Other Clinician: Massie [name] Referring Provider: Treating Provider/Extender: [name] in Treatment: 26 History of Present Illness HPI Description: 01/18/18-She is here for initial evaluation of the left great toe ulcer. She is [name] poor historian in regards to timeframe in detail. She states approximately 4 weeks ago she lacerated her toe on something in the house. She followed up with her primary care who placed her on Bactrim and ultimately [name] second dose of Bactrim prior to coming to wound clinic. She states she has been treating the toe with peroxide, Betadine and [name] Band-Aid. She did not check her blood sugar this morning but checked it yesterday morning it was 327; she is unaware of [name] recent A1c and there are no current records. She saw Dr. she would've  orthopedics last week for an old injury to the left ankle, she states he did not see her toe, nor did she bring it to his attention. She smokes approximately 1 pack cigarettes [name] day. Her social situation is concerning, she arrives this morning with her mother who appears extremely intoxicated/under the influence; her mother was asked to leave the room and be monitored  by the patient's grandmother. The patient's aunt then accompanied the patient and the room throughout the rest of the appointment. We had [name] lengthy discussion regarding the deleterious effects of uncontrolled hyperglycemia and smoking as it relates to wound healing and overall health. She was strongly encouraged to decrease her smoking and get her diabetes under better control. She states she is currently on [name] diet and has cut down her [facility] consumption. The left toe is erythematous, macerated and slightly edematous with malodor present. The edema in her left foot is below her baseline, there is no erythema streaking. We will treat her with Santyl, doxycycline; we have ordered and xray, culture and provided [name] Peg assist surgical shoe and cultured the wound. 01/25/18-She is here in follow-up evaluation for [name] left great toe ulcer and presents with an abscess to her suprapubic area. She states her blood sugars remain elevated, feeling "sick" and if levels are below 250, but she is trying. She has made no attempt to decrease her smoking stating that we "can't take away her food in her cigarettes". She has been compliant with offloading using the PEG assist you. She is using Santyl daily. the culture obtained last week grew staph aureus and Enterococcus faecalis; continues on the doxycycline and Augmentin was added on Monday. The suprapubic area has erythema, no femoral variation, purple discoloration, minimal induration, was accessed with [name] cotton tip applicator with sanguinopurulent drainage, this was cultured, I suspect the current  antibiotic treatment will cover and we will not add anything to her current treatment plan. She was advised to go to urgent care or ER with any change in redness, induration or fever. 02/01/18-She is here in follow-up evaluation for left great toe ulcers and [name] new abdominal abscess from last week. She was able to use packing until earlier this week, where she "forgot it was there". She states she was feeling ill with GI symptoms last week and was not taking her antibiotic. She states her glucose levels have been predominantly less than 200, with occasional levels between 200-250. She thinks this was contributing to her GI symptoms as they have resolved without intervention. There continues to be significant laceration to left toe, otherwise it clinically looks stable/improved. There is now less superficial opening to the lateral aspect of the great toe that was residual blister. We will transition to [facility] to all wounds, she will continue her Augmentin. If there is no change or deterioration next week for reculture. 02/08/18-She is here in follow-up evaluation for left great toe ulcer and abdominal ulcer. There is an improvement in both wounds. She has been wrapping her left toe with coban, not by our direction, which has created an area of discoloration to the medial aspect; she has been advised to NOT use coban secondary to her neuropathy. She states her glucose levels have been high over this last week ranging from 200-350, she continues to smoke. She admits to being less compliant with her offloading shoe. We will continue with same treatment plan and she will follow-up next week. 02/15/18-She is here in follow-up evaluation for left great toe ulcer and abdominal ulcer. The abdominal ulcer is epithelialized. The left great toe ulcer is improved and all injury from last week using the Coban wrap is resolved, the lateral ulcer is healed. She admits to noncompliance with wearing offloading shoe  and admits to glucose levels being greater than 300 most of the week. She continues to smoke and expresses no desire to quit. There  is one area medially that probes deeper than it has historically, erythema to the toe and dorsal foot has consistently waxed and waned. There is no overt signs of cellulitis or infection but we will culture the wound for any occult infection given the new area of depth and erythema. We will hold off on sensitivities for initiation of antibiotic therapy. 02/22/18-She is here in follow up evaluation for left great toe ulcer. There is overall significant improvement in both wound appearance, erythema and edema with changes made last week. She was not initiated on antibiotic therapy. Culture obtained last week showed oxacillin sensitive staph aureus, sensitive to clindamycin. Clindamycin has been called into the pharmacy but she has been instructed to hold off on initiation secondary to overall clinical improvement and her history of antibiotic intolerance. She has been instructed to contact the clinic with any noted changes/deterioration and the wound, erythema, edema and/or AADHYA, BUSTAMANTE [name] ([id number]) [id number] Page 3 of 19 pain. She will follow-up next week. She continues to smoke and her glucose levels remain elevated >250; she admits to compliance with offloading shoe 03/01/18 on evaluation today patient appears to be doing fairly well in regard to her left first toe ulcer. She has been tolerating the dressing changes with the [facility] Dressing without complication and overall this has definitely showed signs of improvement according to records as well is what the patient tells me today. I'm very pleased in that regard. She is having no pain today 03/08/18 She is here for follow up evaluation of [name] left great toe ulcer. She remains non-compliant with glucose control and smoking cessation; glucose levels consistently >200. She states that  she got new shoe inserts/peg assist. She admits to compliance with offloading. Since my last evaluation there is significant improvement. We will switch to prisma at this time and she will follow up next week. She is noted to be tachycardic at this appointment, heart rate 120s; she has [name] history of heart rate 70-130 according to our records. She admits to extreme agitation r/t personal issues; she was advised to monitor her heartrate and contact her physician if it does not return to [name] more normal range (<100). She takes cardizem twice daily. 03/15/18-She is here in follow-up evaluation for left great toe ulcer. She remains noncompliant with glucose control and smoking cessation. She admits to compliance with wearing offloading shoe. The ulcer is improved/stable and we will continue with the same treatment plan and she will follow-up next week 03/22/18-She is here for evaluation for left great toe ulcer. There continues to be significant improvement despite recurrent hyperglycemia (over 500 yesterday) and she continues to smoke. She has been compliant with offloading and we will continue with same treatment plan and she will follow-up next week. 03/29/18-She is here for evaluation for left great toe ulcer. Despite continuing to smoke and uncontrolled diabetes she continues to improve. She is compliant with offloading shoe. We will continue with the same treatment plan and she will follow-up next week 04/05/18- She is here in follow up evaluation for [name] left great toe ulcer; she presents with small pustule to left fifth toe (resembles ant bite). She admits to compliance with wearing offloading shoe; continues to smoke or have uncontrolled blood glucose control. There is more callus than usual with evidence of bleeding; she denies known trauma. 04/12/18-She is here for evaluation of left great toe ulcer. Despite noncompliance with glycemic control and smoking she continues to make improvement. She continues to  wear offloading shoe.  The pustule, that was identified last week, to the left fifth toe is resolved. She will follow-up in 2 weeks 05/03/18-she is seen in follow-up evaluation for [name] left great toe ulcer. She is compliant with offloading, otherwise noncompliant with glycemic control and smoking. She has plateaued and there is minimal improvement noted. We will transition to [facility], replaced the insert to her surgical shoe and she will follow-up in one week 05/10/18- She is here in follow up evaluation for [name] left great toe ulcer. It appears stable despite measurement change. We will continue with same treatment plan and follow up next week. 05/24/18-She is seen in follow-up evaluation for [name] left great toe ulcer. She remains compliant with offloading, has made significant improvement in her diet, decreasing the amount of sugar/soda. She said her recent A1c was 10.9 which is lower than. She did see [name] diabetic nutritionist/educator yesterday. She continues to smoke. We will continue with the same treatment plan and she'll follow-up next week. 05/31/18- She is seen in follow-up evaluation for left great toe ulcer. She continues to remain compliant with offloading, continues to make improvement in her diet, increasing her water and decreasing the amount of sugar/soda. She does continue to smoke with no desire to quit. We will apply Prisma to the depth and Hydrofera Blue over. We have not received insurance authorization for oasis. She will follow up next week. 06/07/18-She is seen in follow-up evaluation for left great toe ulcer. It has stalled according to today's measurements although base appears stable. She says she saw [name] diabetic educator yesterday; her average blood sugars are less than 300 which is an improvement for her. She continues to smoke and states "that's my next step" She continues with water over soda. We will order for xray, culture and reinstate ace wrap compression prior to placing  apligraf for next week. She is voicing no complaints or concerns. Her dressing will change to iodoflex over the next week in preparation for apligraf. 06/14/18-She is seen in follow-up evaluation for left great toe ulcer. Plain film x-ray performed last week was negative for osteomyelitis. Wound culture obtained last week grew strep B and OSSA; she is initiated on keflex and cefdinir today; there is erythema to the toe which could be from ace wrap compression, she has [name] history of wrapping too tight and has has been encouraged to maintain ace wraps that we place today. We will hold off on application of apligraf today, will apply next week after antibiotic therapy has been initiated. She admits today that she has resumed taking [name] shower with her foot/toe submerged in water, she has been reminded to keep foot/toe out of the bath water. She will be seen in follow up next week 06/21/18-she is seen in follow-up evaluation for left great toe ulcer. She is tolerating antibiotic therapy with no GI disturbance. The wound is stable. Apligraf was applied today. She has been decreasing her smoking, only had 4 cigarettes yesterday and 1 today. She continues being more compliant in diabetic diet. She will follow-up next week for evaluation of site, if stable will remove at 2 weeks. 06/28/18- She is here in follow up evalution. Apligraf was placed last week, she states the dressing fell off on Tuesday and she was dressing with hydrofera blue. She is healed and will be discharged from the clinic today. She has been instructed to continue with smoking cessation, continue monitoring glucose levels, offloading for an additional 4 weeks and continue with hydrofera blue for additional two  weeks for any possible microscopic opening. Readmission: 08/07/18 on evaluation today patient presents for reevaluation concerning the ulcer of her right great toe. She was previously discharged on 06/28/18 healed. Nonetheless she states that  this began to show signs of drainage she subsequently went to her primary care provider. Subsequently an x-ray was performed on 08/01/18 which was negative. The patient was also placed on antibiotics at that time. Fortunately they should have been effective for the infection. Nonetheless she's been experiencing some improvement but still has [name] lot of drainage coming from the wound itself. 08/14/18 on evaluation today patient's wound actually does show signs of improvement in regard to the erythema at this point. She has completed the antibiotics. With that being said we did discuss the possibility of placing her in [name] total contact cast as of today although I think that I may want to give this just [name] little bit more time to ensure nothing recurrence as far as her infection is concerned. I do not want to put in the cast and risk infection at that time if things are not completely resolved. With that being said she is gonna require some debridement today. 08/21/18 on evaluation today patient actually appears to be doing okay in regard to her toe ulcer. She's been tolerating the dressing changes without complication. With that being said it does appear that she is ready and in fact I think it's appropriate for [place] to go ahead and initiate the total contact cast today. Nonetheless she will require some sharp debridement to prepare the wound for application. Overall I feel like things have been progressing well but we do need to do something to get this to close more readily. 08/24/18 patient seen today for reevaluation after having had the total contact cast applied on Tuesday. She seems to have done very well the wound appears to be doing great and overall I'm pleased with the progress that she's made. There were no abnormal areas of rubbing from the cast on her lower extremity. 08/30/18 on evaluation today patient actually appears to be completely healed in regard to her plantar toe ulcer. She tells me at this  point she's been having [name] lot of issues with the cast. She almost fell [name] couple of times the state shall the step of her dog [name] couple times as well. This is been [name] very frustrating process for her other nonetheless she has completely healed the wound which is excellent news. Overall there does not appear to be the evidence of infection at this time which is great news. 09/11/18 evaluation today patient presents for follow-up concerning her great toe ulcer on the left which has unfortunately reopened since I last saw her which was only [name] couple of weeks ago. Unfortunately she was not able to get in to get the shoe and potentially the AFO that's gonna be necessary due to her left foot drop. She continues with offloading shoe but this is not enough to prevent her from reopening it appears. When we last had her in the total contact cast she did well from [name] healing standpoint but unfortunately the wound reopened as soon as she came out of the cast within just [name] couple of weeks. 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 This is  also part of the reason why she falls on [name] regular basis. I just do not believe that is gonna be safe for [place] to reinitiate the total contact cast as last time we had this on she fell 3 times one week which is definitely not normal for her. 09/18/18 upon evaluation today the patient actually appears to be doing about the same in regard to her toe ulcer. She did not contact [employer] as I asked her to even though I had given her the prescription. In fact she actually states that she has no idea where the prescription is. She did apparently call [employer] and they told her that all she needed to do was bring the prescription in order to be able to be seen and work on getting the AFO for her left  foot. With all that being said she still does not have an appointment and I'm not sure were things stand that regard. I will give her [name] new prescription today in order to contact them to get this set up. 09/25/18 on evaluation today patient actually appears to be doing about the same in regard to her toes ulcer. She does have [name] small areas which seems to have [name] lot of callous buildup around the edge of the wound which is going to need sharp debridement today. She still is waiting to be scheduled for evaluation with [employer] for possibility of an AFO. She states there supposed to call her tomorrow to get this set up. Unfortunately it does appear that her foot specifically the toe area is showing signs of erythema. There does not appear to be any systemic infection which is in these good news. [name], [name] ([id number]) [id number] Page 4 of 19 10/02/18 on evaluation today patient actually appears to be doing about the same in regard to her toe ulcer. This really has not done too well although it's not significantly larger it's also not significantly smaller. She has been tolerating the dressing changes without complication. She actually has her appointment with [employer] and [place] tomorrow to hopefully be measured for obtaining and AFO splint. I think this would be helpful preventing this from reoccurring. We had contemplated starting the cast this week although to be honest I am reluctant to do that as she's been having nausea, vomiting, and seizure activity over the past three days. She has [name] history of seizures and have been told is nothing that can be done for these. With that being said I do believe that along with the seizures have the nausea vomiting which upon further questioning doesn't seem to be the normal for her and makes me concerned for the possibility of infection or something else going on. I discussed this with the patient and her mother during the office  visit today. I do not feel the wound is effective but maybe something else. The responses this was "this just happens to her at times and we don't know why". They did not seem to be interested in going to the hospital to have this checked out further. 10/09/18 on evaluation today patient presents for follow-up concerning her ongoing toe ulcer. She has been tolerating the dressing changes without complication. Fortunately there does not appear to be any evidence of infection which is great news however I do think that the patient would benefit from going ahead for with the total contact cast. She's actually in [name] wheelchair today she tells me that she will use her walker if we initiate the cast. I was  very specific about the fact that if we were gonna do the cast I wanted to make sure that she was using the walker in order to prevent any falls. She tells me she does not have stairs that she has to traverse on [name] regular basis at her home. She has not had any seizures since last week again that something that happens to her often she tells me she did talk to [employer] and they said that it may take up to three weeks to get the brace approved for her. Hopefully that will not take that long but nonetheless in the meantime I do think the cast could be of benefit. 10/12/18 on evaluation today patient appears to be doing rather well in regard to her toe ulcer. It's just been [name] few days and already this is significantly improved both as far as overall appearance and size. Fortunately there's no sign of infection. She is here for her first obligatory cast change. 10/19/18 Seen today for follow up and management of left great toe ulcer. Wound continues to show improvement. Noted small open area with seroussang drainage with palpation. Denies any increased pain or recent fevers during visit. She will continue calcium alginate with offloading shoe. Denies any questions or concerns during visit. 10/26/18 on evaluation  today patient appears to be doing about the same as when I last saw her in regard to her wound bed. Fortunately there does not appear to be any signs of infection. Unfortunately she continues to have [name] breakdown in regard to the toe region any time that she is not in the cast. It takes almost no time at all for this to happen. Nonetheless she still has not heard anything from the brace being made by [employer] as to when exactly this will be available to her. Fortunately there is no signs of infection at this time. 10/30/18 on evaluation today patient presents for application of the total contact cast as we just received him this morning. Fortunately we are gonna be able to apply this to her today which is great news. She continues to have no significant pain which is good news. Overall I do feel like things have been improving while she was the cast is when she doesn't have [name] cast that things get worse. She still has not really heard anything from [place] regarding her brace. 11/02/18 upon evaluation today patient's wound already appears to be doing significantly better which is good news. Fortunately there does not appear to be any signs of infection also good news. Overall I do think the total contact cast as before is helping to heal this area unfortunately it's just not gonna likely keep the area closed and healed without her getting her brace at least. Again the foot drop is [name] significant issue for her. 11/09/18 on evaluation today patient appears to be doing excellent in regard to her toe ulcer which in fact is completely healed. Fortunately we finally got the situation squared away with the paperwork which was needed to proceed with getting her brace approved by Medicaid. I have filled that out unfortunately that information has been sent to the orthopedic office that I worked at 2 1/2 years ago and not tired Current wound care measures. Fortunately she seems to be doing very well at this  time. 11/23/18 on evaluation today patient appears to be doing More Poorly Compared to Last Time I Saw Her. At [facility] She Had Completely Healed. Currently she is continuing to have issues with  reopening. She states that she just found out that the brace was approved through Medicaid now she just has to go get measured in order to have this fitted for her and then made. Subsequently she does not have an appointment for this yet that is going to complicate things we obviously cannot put her back in the cast if we do not have everything measured because they're not gonna be able to measure her foot while she is in the cast. 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 Obviously this is unfortunate and does have me somewhat worried as well. 11/30/18 on evaluation today patient's toe ulcer actually appears to be doing fairly well. The good news is she will be getting her brace in the shoes next week on Wednesday. Hopefully we will be able to get this to heal without having to go back in the cast however she may need the cast in order to get the wound completely heal and then go from there. Fortunately there's no signs of infection at this time. 12/07/18 on evaluation today patient fortunately did receive her brace and she states she could tell this definitely makes her walk better. With that being said she's been having issues with her toe where she noticed yesterday there was [name] lot of tissue that was loosing off this appears to be much larger than what it was previous. She also states that her leg has been read putting much across the top of her foot just about the ankle although this seems to be receiving somewhat. The total area is still red and appears to be someone infected as best I can tell. She is previously taken Bactrim and that may be [name] good option for her today as well. We are gonna see what I wound  culture shows as well and I think that this is definitely appropriate. With that being said outside of the culture I still need to initiate something in the interim and that's what I'm gonna go ahead and select Bactrim is [name] good option for her. 12/14/18 on evaluation today patient appears to be doing better in regard to her left great toe ulcer as compared to last week's evaluation. There's still some erythema although this is significantly improved which is excellent news. Overall I do believe that she is making good progress is still gonna take some time before she is where I would like her to be from the standpoint of being able to place her back into the total contact cast. Hopefully we will be where we need to be by next week. 12/21/18 on evaluation today patient actually appears to be doing poorly in regard to her toe ulcer. She's been tolerating the dressing changes without complication. Fortunately there's no signs of systemic infection although she does have [name] lot of drainage from the toe ulcer and this does seem to be causing some issues at this point. She does have erythema on the distal portion of her toe that appears to be likely cellulitis. 12/28/18 on evaluation today patient actually appears to be doing [name] little better in my pinion in regard to her toe ulcer. With that being said she still does have some evidence of infection at this time and for her culture she had both E. coli as well as enterococcus as organisms noted on evaluation. For that reason I think that though the Keflex likely has  treated the E. coli rather well this has really done nothing for the enterococcus. We are going to have to initiate treatment for this specifically. 01/04/19 on evaluation today patient's toe actually appears to be doing better from the standpoint of infection. She currently would like to see about putting the cash back on I think that this is appropriate as long as she takes care of it and keeps it  from getting wet. She is gonna have some drainage we can definitely pass this up with Drawtex and alginate to try to prevent as much drainage as possible from causing the problems. With that being said I do want to at least try her with the cast between now and Tuesday. If there any issues we can't continue to use it then I will discontinue the use of the cast at that point. 01/08/19 on evaluation today patient actually appears to be doing very well as far as her foot ulcer specifically the great toe on the left is concerned. She did have an area of rubbing on the medial aspect of her left ankle which again is from the cast. Fortunately there's no signs of infection at this point in this appears to be [name] very slight skin breakdown. The patient tells me she felt it rubbing but didn't think it was that bad. Fortunately there is no signs of active infection at this time which is good news. No fevers, chills, nausea, or vomiting noted at this time. 01/15/19 on evaluation today patient actually appears to be doing well in regard to her toe ulcer. Again as previous she seems to do well and she has the cast on which indicates to me that during the time she doesn't have [name] cast on she's putting way too much pressure on this region. Obviously I think that's gonna be an issue as with the current national emergency concerning the Covid-19 Virus it has been recommended that we discontinue the use of total contact casting by the chief medical officer of our company, Dr. Simona Huh. The reasoning is that if [name] patient becomes sick and cannot come into have the cast removed they could not just leave this on for an additional two weeks. Obviously the hospitals also do not want to receive patient's who are sick into the emergency department to potentially contaminate the region and spread the Covid-19 Virus among other sick individuals within the hospital system. Therefore at this point we are ASTELLA, DESIR [name] ([id number])  [id number] Page 5 of 19 suspending the use of total contact cast until the current emergency subsides. This was all discussed with the patient today as well. 01/22/19 on evaluation today patient's wound on her left great toe appears to be doing slightly worse than previously noted last week. She tells me that she has been on this quite [name] bit in fact she tells me she's been awake for 38 straight hours. This is due to the fact that she's having to care for grandparents because nobody else will. She has been taking care of them for five the last seven days since I've seen her they both have dementia his is from [name] stroke and her grandmother's was progressive. Nonetheless she states even her mom who knows her condition and situation has only help two of those days to take care of them she's been taking care of the rest. Fortunately there does not appear to be any signs of active infection in regard to her toe at this point although obviously it doesn't look as  good as it did previous. I think this is directly related to her not taking off the pressure and friction by way of taking things easy. Though I completely understand what's going on. 01/29/19 on evaluation today patient's tools are actually appears to be showing some signs of improvement today compared to last week's evaluation as far as not necessarily the overall size of the wound but the fact that she has some new skin growth in between the two ends of the wound opening. Overall I feel like she has done well she states that she had [name] family member give her what sounds to be [name] CAM walker boot which has been helpful as well. 02/05/19 on evaluation today patient's wound bed actually appears to be doing significantly better in regard to her overall appearance of the size of the wound. With that being said she is still having an issue with offloading efficiently enough to get this to close. Apparently there is some signs of  infection at this point as well unfortunately. Previously she's done well of Augmentin I really do not see anything that needs to be culture currently but there theme and cellulitis of the foot that I'm seeing I'm gonna go ahead and place her on an antibiotic today to try to help clear this up. 02/12/2019 on evaluation today patient actually appears to be doing poorly in regard to her overall wound status. She tells me she has been using her offloading shoe but actually comes in today wearing her tennis shoe with the AFO brace. Again as I previously discussed with her this is really not sufficient to allow the area to heal appropriately. Nonetheless she continues to be somewhat noncompliant and I do wonder based on what she has told my nurse in the past as to whether or not [name] good portion of this noncompliance may be recreational drug and alcohol related. She has had [name] history of heroin overdose and this was fairly recently in the past couple of months that have been seeing her. Nonetheless overall I feel like her wound looks significantly worse today compared to what it was previous. She still has significant erythema despite the Augmentin I am not sure that this is an appropriate medication for her infection I am also concerned that the infection may have gone down into her bone. 02/19/19 on evaluation today patient actually appears to be doing about the same in regard to her toe ulcer. Unfortunately she continues to show signs of bone exposure and infection at this point. There does not appear to be any evidence of worsening of the infection but I'm also not really sure that it's getting significantly better. She is on the Augmentin which should be sufficient for the Staphylococcus aureus infection that she has at this point. With that being said she may need IV antibiotics to more appropriately treat this. We did have [name] discussion today about hyperbaric option therapy. 02/28/19 on evaluation today  patient actually appears to be doing much worse in regard to the wound on her left great toe as compared to even my previous evaluation last week. Unfortunately this seems to be training in [name] pretty poor direction. Her toe was actually now starting to angle laterally and I can actually see the entire joint area of the proximal portion of the digit where is the distal portion of the digit again is no longer even in contact with the joint line. Unfortunately there's [name] lot more necrotic tissue around the edge and the toe  appears to be showing signs of becoming gangrenous in my pinion. I'm very concerned about were things stand at this point. She did see infectious disease and they are planning to send in [name] prescription for Sivextro for her and apparently this has been approved. With that being said I don't think she should avoid taking this but at the same time I'm not sure that it's gonna be sufficient to save her toe at this point. She tells me that she still having to care for grandparents which I think is putting quite [name] bit of strain on her foot and specifically the total area and has caused this to break down even to [name] greater degree than would've otherwise been expected. 03/05/19 on evaluation today patient actually appears to be doing quite well in regard to her toe all things considering. She still has bone exposed but there appears to be much less your thing on overall the appearance of the wound and the toe itself is dramatically improved. She still does have some issues currently obviously with infection she did see vascular as well and there concerned that her blood flow to the toad. For that reason they are setting up for an angiogram next week. 03/14/19 on evaluation today patient appears to be doing very poor in regard to her toe and specifically in regard to the ulceration and the fact that she's starting to notice the toe was leaning even more towards the lateral aspect and the complete  joint is visible on the proximal aspect of the joint. Nonetheless she's also noted [name] significant odor and the tip of the toe is turning more dark and necrotic appearing. Overall I think she is getting worse not better as far as this is concerned. For that reason I am recommending at this point that she likely needs to be seen for likely amputation. READMISSION 03/19/2021 This is [name] patient that we cared for in this clinic for [name] prolonged period of time in 2019 and 2020 with [name] left foot and left first toe wound. 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 In December 2021 she had an ulcer on her right great toe as well as the fourth and fifth toes. 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 She also had an angiogram at that time and underwent angioplasty of the right anterior tibial artery. In any case she claims that the wound on the right foot is closed I did not look at this today which was probably an oversight although I think that should be done next week. 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 According to Dr. Deborra Medina last review that she was out of the area being cared for by another physician but recently came back to his attention. 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 [name] culture of this area showed E. coli apparently before she came back to see [name] she was supposed to be receiving antibiotics but she did not really take them. Nor is she offloading this area at all. Finally her last hemoglobin A1c listed in epic was in March 2022 at 14.1 she says things are [name] lot better since then although I am not sure. 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  She had this wound at the time.  During this admission she had E. coli sepsis [name] TEE was negative. 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 She is still [name] heavy cigarette smoker. She has [name] history of bipolar disorder seizure disorder ABI in our clinic was 1.05 6/1; left midfoot in the setting of [name] TMA done previously. Round circular wound with [name] "knuckle" of protruding tissue. The problem is that the knuckle was not attached to any of the surrounding granulation and this probed proximally widely I removed [name] large portion of this tissue. This wound goes with considerable undermining laterally. I do not feel any bone there was no purulence but this is [name] deep wound. 6/8; in spite of the debridement I did last week. She arrives with [name] wound looking exactly the same. [name] protruding "knuckle" of tissue nonadherent to most of the surrounding tissue. There is considerable depth around this from 6-12 o'clock at 2.7 cm and undermining of 1 cm. This does not look overtly infected and the x- ray I did last week was negative for any osseous abnormalities. We have been using silver collagen 6/15; deep tissue culture I did last week showed moderate staph aureus and moderate Pseudomonas. This will definitely require prolonged antibiotic therapy. The pathology on the protuberant area was negative for malignancy fungus etc. 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 We have been using silver collagen. I am going to be prescribing Levaquin for 2 weeks. Her CT scan of the foot is down for 7/5 6/22; CT scan of the foot on 7 5. She says she has hardware in the left leg from her previous fracture. She is on the Levaquin for the deep tissue culture  I did that showed methicillin sensitive staph aureus and Pseudomonas. I gave her [name] 2-week supply and she will have another week. She arrives in clinic today with the same protuberant tissue however this is nonadherent to the tissue surrounding it. I am really at [name] loss to explain this unless there is underlying deep tissue infection 6/29; patient presents for 1 week follow-up. She has been using collagen to the wound bed. She reports taking her antibiotics as prescribed.She has no complaints or issues today. She denies signs of infection. 7/6; patient presents for one week followup. She has been using collagen to the wound bed. She states she is taking Levaquin however at times she is not able to keep it down. She denies signs of infection. 7/13; patient presents for 1 week follow-up. She has been using silver alginate to the wound bed. She still has nausea when taking her antibiotics. She denies [name], [name] ([id number]) [id number] Page 6 of 19 signs of infection. 7/20; patient presents for 1 week follow-up. She has been using silver alginate with gentamicin cream to the wound bed. She denies any issues and has no complaints today. She denies signs of infection. 7/27; patient presents for 1 week follow-up. She continues to use silver alginate with gentamicin cream to the wound bed. She reports starting her antibiotics. She has no issues or complaints. Overall she reports stability to the wound. 8/3; patient presents for 1 week follow-up. She has been using silver alginate with gentamicin cream to the wound bed. She reports  completing all antibiotics. She has no issues or complaints today. She denies signs of infection. 8/17; patient presents for 2-week follow-up. He is to use silver alginate to the wound bed. She has no issues or complaints today. She denies signs of infection. She reports her pain has improved in her foot since last clinic visit 8/24; patient  presents for 1 week follow-up. She continues to use silver alginate to the wound bed. She has no issues or complaints. She denies signs of infection. Pain is stable. 9/7; patient presents for follow-up. She missed her last week appointment due to feeling ill. She continues to use silver alginate. She has [name] new wound to the right lower extremity that is covered in eschar. She states It occurred over the past week and has no idea how it started. She currently denies signs of infection. 9/14; patient presents for follow-up. T the left foot wound she has been using gentamicin cream and silver alginate. T the right lower extremity wound she has o o been keeping this covered and has not obtain Santyl. 9/21; patient presents for follow-up. She reports using gentamicin cream and silver alginate to the left foot and Santyl to the right lower extremity wound. She has no issues or complaints today. She denies signs of infection. 9/28; patient presents for follow-up. She reports [name] new wound to her right heel. She states this occurred [name] few days ago and is progressively gotten worse. She has been trying to clean the area with [name] Q-tip and Santyl. She reports stability in the other 2 wounds. She has been using gentamicin cream and silver alginate to the left foot and Santyl to the right lower extremity wound. 10/12; patient presents for follow-up. She reports improvement to the wound beds. She is seeing vein and vascular to discuss the potential of [name] left BKA. She states they are going to do an arteriogram. She continues to use silver alginate with dressing changes to her wounds. 11/2; patient presents for follow-up. She states she has not been doing dressing changes to the wound beds. She states she is not able to offload the areas. She reports chronic pain to her left foot wound. 11/9; patient presents for follow-up. She came in with only socks on. She states she forgot to put on shoes. It is unclear if she is  doing any dressing changes. She currently denies systemic signs of infection. 11/16; patient presents for follow-up. She came again only with socks on. She states she does not wear shoes ever. It is unclear if she does dressing changes. She currently denies systemic signs of infection. 11/23; patient presents for follow-up. She wore her shoes today. It still unclear exactly what dressing she is using for each wound but she did states she obtained Dakin's solution and has been using this to the left foot wound. She currently denies signs of infection. 11/30; patient presents for follow-up. She has no issues or complaints today. She currently denies signs of infection. 12/7; patient presents for follow-up. She has no issues or complaints today. She has been using Hydrofera Blue to the right heel wound and Dakin solution to the left foot wound. Her right anterior leg wound is healed. She currently denies signs of infection. 12/14; patient presents for follow-up. She has been using Hydrofera Blue to the right heel and Dakin's to the left foot wounds. She has no issues or complaints today. She denies signs of infection. 12/21; patient presents for follow-up. She reports using Hydrofera Blue to the  right heel and Dakin's to the left foot wound. She denies signs of infection. 12/28; patient presents for follow-up. She continues to use Dakin's to the left foot wound and Hydrofera Blue to the right heel wound. She denies signs of infection. 1/4; patient presents for follow-up. She has no issues or complaints today. She denies signs of infection. 1/11; patient presents for follow-up. It is unclear if she has been dressing these wounds over the past week. She currently denies signs of infection. 1/18; patient presents for follow-up. She states she has been using Dakin's wet-to-dry dressings to the left foot. She has been using Hydrofera Blue to the right foot foot wound. She states that the anterior right leg  wound has reopened and draining serous fluid. She denies signs of infection. 1/25; patient presents for follow-up. She has no issues or complaints today. 2/1; patient presents for follow-up. She has no issues or complaints today. She denies signs of infection. 2/8; patient presents for follow-up. She has lost her surgical shoes. She did not have [name] dressing to the right heel wound. She currently denies signs of infection. 2/15; patient presents for follow-up. She reports more pain to the right heel today. She denies purulent drainage Or fever/chills 2/22; patient presents for follow-up. She reports taking clindamycin over the past week. She states that she continues to have pain to her right heel. She reports purulent drainage. Readmission 03/16/2022 Ms. Braleigh Massoud is [name] 46 year old female with [name] past medical history of type 2 diabetes, osteomyelitis to her feet, chronic systolic heart failure and bipolar disorder that presents to the clinic for bilateral feet wounds and right lower extremity wound. She was last seen in our clinic on 12/15/2021. At that time she had purulent drainage coming out of her right plantar foot and I recommended she go to the ED. She states she went to [facility] and has been there for the past 3 months. I cannot see the records. She states she had OR debridement and was on several weeks of IV antibiotics while inpatient. Since discharge she has not been taking care of the wound beds. She had nothing on her feet other than socks today. She currently denies signs of infection. 5/31; patient presents for follow-up. She has been using Dakin's wet-to-dry dressings to the wound beds on her feet bilaterally and antibiotic ointment to the right anterior leg wound. She had [name] wound culture done at last clinic visit that showed moderate Pseudomonas aeruginosa sensitive to ciprofloxacin. She currently denies systemic signs of infection. 6/14; patient presents for  follow-up. She received Keystone 5 days ago and has been using this on the wound beds. She states that last week she had to go to the hospital because she had increased warmth and erythema to the right foot. She was started on 2 oral antibiotics. She states she has been taking these. She currently denies systemic signs of infection. She has no issues or complaints today. 6/21; patient presents for follow-up. She states she has been using Keystone antibiotics to the wound beds. She has no issues or complaints today. She denies signs of infection. 6/28; patient presents for follow-up. She has been using Keystone antibiotics to the wound beds. She has no issues or complaints today. 7/12; patient presents for follow-up. Has been using Keystone antibiotics to the wound beds with calcium alginate. She has no issues or complaints today. She never followed up with her orthopedic surgeon who did the OR debridement to the right foot. We discussed  the total contact cast for the left foot and patient would like to do this next week. [name], [name] ([id number]) [id number] Page 7 of 19 7/19; patient presents for follow-up. She has been using Keystone antibiotics with calcium alginate to the wound beds. She has no issues or complaints today. Patient is in agreement to do the total contact cast of the left foot today. She knows to return later this week for the obligatory cast change. 05-13-2022 upon evaluation today patient's wound which she has the cast of the left leg actually appears to be doing significantly better. Fortunately I do not see any signs of active infection locally or systemically which is great news and overall I am extremely pleased with where we stand currently. 7/26; patient presents for follow-up. She has [name] cast in place for the past week. She states it irritated her shin. Other than that she tolerated the cast well. She states she would like [name] break for 1 week from  the cast. We have been using Keystone antibiotic and Aquacel to both wound beds. She denies signs of infection. 8/2; patient presents for follow-up. She has been using Keystone and Aquacel to the wound beds. She denies any issues and has no complaints. She is agreeable to have the cast placed today for the left leg. 06-03-2022 upon evaluation today patient appears to be doing well with regard to her wound she saw some signs of improvement which is great news. Fortunately I do not see any evidence of active infection locally or systemically at this time which is great news. No fevers, chills, nausea, vomiting, or diarrhea. 8/16; patient presents for follow-up. She has no issues or complaints today. We have been using Keystone and Aquacel to the wound beds. The left lower extremity is in [name] total contact cast. She is tolerated this well. 8/23; patient presents for follow-up. She has had the total contact cast on the left leg for the past week. Unfortunately this has rubbed and broken down the skin to the medial foot. She currently denies signs of infection. She has been using Keystone antibiotic to the right plantar foot wound. 8/30; patient presents for follow-up. We have held off on the total contact cast for the left leg for the past week. Her wound on the left foot has improved and the previous surrounding breakdown of skin has epithelialized. She has been using Keystone antibiotic to both wound beds. She has no issues or complaints today. She denies signs of infection. 9/6; patient presents for follow-up. She has ordered her's Keystone antibiotic and this is arriving this week. She has been doing Dakin's wet-to-dry dressings to the wound beds. She denies signs of infection. She is agreeable to the total contact cast today. 9/13; patient presents for follow-up. She states that the cast caused her left leg shin to be sore. She would like to take [name] break from the cast this week. She has been using  Keystone antibiotic to the right plantar foot wound. She denies signs of infection. 9/20; patient presents for follow-up. She has been using Keystone antibiotics to the wound beds with calcium alginate to the right foot wound and Hydrofera Blue to the left foot wound. She is agreeable to having the cast placed today. She has been approved for Apligraf and we will order this for next clinic visit. 9/27; patient presents for follow-up. We have been using Keystone antibiotic with Hydrofera Blue to the left foot wound under [name] total contact cast. T the right o  foot wound she has been using Keystone antibiotic and calcium alginate. She declines [name] total contact cast today. Apligraf is available for placement and she would like to proceed with this. 07-28-2022 upon evaluation today patient appears to be doing well currently in regard to her wound. She is actually showing signs of significant improvement which is great news. Fortunately I do not see any evidence of active infection locally nor systemically at this time. She has been seeing Dr. Heber [place] and to be honest has been doing very well with the cast. Subsequently she comes in today with [name] cast on and we did reapply that today as well. She did not really want to she try to talk me out of that but I explained that if she wanted to heal this is really the right way to go. Patient voiced understanding. In regard to her right foot this is actually [name] lot better compared to the last time I saw her which is also great news. 10/11; patient presents for follow-up. Apligraf and the total contact cast was placed to the left leg at last clinic visit. She states that her right foot wound had burning pain to it with the placement of Apligraf to this area. She has been doing [place] over this area. She denies signs of infection including increased warmth, erythema or purulent drainage. 11/1; 3-week follow-up. The patient fortunately did not have [name] total contact cast or  an Apligraf and on the left foot. She has been using Keystone ABD pads and kerlix and her own running shoes She arrives in clinic today with thick callus and [name] very poor surface on the left foot on the right nonviable skin subcutaneous tissue and [name] deep probing hole. 11/15; patient missed her last clinic appointment. She states she has not been dressing the wound beds for the past 2 weeks. She states that at she had [name] new roommate but is now going back to live with her mother. Apparently its been [name] distracting 2 weeks. Patient currently denies signs of infection. 11/22; patient presents for follow-up. She states she has been using Keystone antibiotic and Dakin's wet-to-dry dressings to the wound beds. She is agreeable for cast placement today. We had ordered Apligraf however this has not been received by our facility. Electronic Signature(s) Signed: 09/14/2022 12:14:43 PM By: Heather Shan DO Entered By: [name] on 09/14/2022 11:35:48 -------------------------------------------------------------------------------- Physical Exam Details Patient Name: Date of Service: Heather Hitch [name]. 09/14/2022 10:30 [name]: [id number] Patient Account Number: [id number] Date of Birth/Sex: Treating RN: 01/31/1976 (46 y.o. [name] Primary Care Provider: Raelene [name] Other Clinician: [place], Asberry [name] [id number] Page 8 of 19 Referring Provider: Treating Provider/Extender: [name] in Treatment: 26 Constitutional . Cardiovascular . Psychiatric . Notes Right foot: T the plantar heel there is an incision site with increased depth.T the opening there is granulation tissue. Left foot: T the medial aspect there is an o o o open wound with granulation tissue and nonviable tissue with surrounding callus. No overt signs of infection to any of the wound beds. Electronic Signature(s) Signed:  09/14/2022 12:14:43 PM By: Heather Shan DO Entered By: [name] on 09/14/2022 11:42:00 -------------------------------------------------------------------------------- Physician Orders Details Patient Name: Date of Service: Heather Hitch [name]. 09/14/2022 10:30 [name]: [id number] Patient Account Number: [id number] Date of Birth/Sex: Treating RN: 12-Jun-1976 (46 y.o. [name] Primary Care Provider: Raelene [name] Other Clinician: Massie [name] Referring Provider: Treating  Provider/Extender: Linna Hoff Weeks in Treatment: 44 Verbal / Phone Orders: No Diagnosis Coding Follow-up Appointments Return Appointment in 1 week. Nurse Visit as needed Bathing/ Shower/ Hygiene May shower with wound dressing protected with water repellent cover or cast protector. No tub bath. Anesthetic (Use 'Patient Medications' Section for Anesthetic Order Entry) Lidocaine applied to wound bed Edema Control - Lymphedema / Segmental Compressive Device / Other Elevate, Exercise Daily and [name] void Standing for Long Periods of Time. Elevate legs to the level of the heart and pump ankles as often as possible Elevate leg(s) parallel to the floor when sitting. Off-Loading Total Contact Cast to Left Lower Extremity Open toe surgical shoe - Heel off loader - Right foot Other: - keep pressure off of feet. Medications-Please add to medication list. Wound #12 Left,Medial,Plantar Foot Keystone Compound - Keystone gel right foot Wound Treatment Wound #11 - Foot Wound Laterality: Plantar, Right Cleanser: Dakin 16 (oz) 0.25 1 x Per Day/30 Days Discharge Instructions: Use as directed. LOUCINDA, CROY [name] ([id number]) [id number] Page 9 of 19 Cleanser: Soap and Water 1 x Per Day/30 Days Discharge Instructions: Gently cleanse wound with antibacterial soap, rinse and pat dry prior to dressing wounds Topical: keystone gel 1 x Per Day/30 Days Prim  Dressing: Aquacel Extra Hydrofiber Dressing, 2x2 (in/in) ary 1 x Per Day/30 Days Secondary Dressing: ABD Pad 5x9 (in/in) (Generic) 1 x Per Day/30 Days Discharge Instructions: Cover with ABD pad Secured With: Medipore T - 8M Medipore H Soft Cloth Surgical T ape ape, 2x2 (in/yd) (Generic) 1 x Per Day/30 Days Secured With: Kerlix Roll Sterile or Non-Sterile 6-ply 4.5x4 (yd/yd) (Generic) 1 x Per Day/30 Days Discharge Instructions: Apply Kerlix as directed Wound #12 - Foot Wound Laterality: Plantar, Left, Medial Cleanser: Dakin 16 (oz) 0.25 (Dispense As Written) 1 x Per Day/30 Days Discharge Instructions: Use as directed. Topical: Vitamin AandD Ointment, 4(oz) tube 1 x Per Day/30 Days Discharge Instructions: apply to red, broken down area Prim Dressing: Gauze 1 x Per Day/30 Days ary Discharge Instructions: moisten with Dakins solution Secondary Dressing: ABD Pad 5x9 (in/in) (Generic) 1 x Per Day/30 Days Discharge Instructions: Cover with ABD pad Secured With: Medipore T - 8M Medipore H Soft Cloth Surgical T ape ape, 2x2 (in/yd) (Generic) 1 x Per Day/30 Days Secured With: Kerlix Roll Sterile or Non-Sterile 6-ply 4.5x4 (yd/yd) (Generic) 1 x Per Day/30 Days Discharge Instructions: Apply Kerlix as directed Electronic Signature(s) Signed: 09/14/2022 12:14:43 PM By: Heather Shan DO Entered By: [name] on 09/14/2022 11:45:50 -------------------------------------------------------------------------------- Problem List Details Patient Name: Date of Service: Heather Hitch [name]. 09/14/2022 10:30 [name]: [id number] Patient Account Number: [id number] Date of Birth/Sex: Treating RN: Jun 09, 1976 (46 y.o. 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 03/16/2022 No Yes severity L97.512 Non-pressure chronic ulcer of other part of right foot with fat layer exposed 03/16/2022 No Yes Hollis, Yen [name] ([id number]) [id number] Page 10 of 19 E11.621 Type 2 diabetes mellitus with foot ulcer 03/16/2022 No Yes E11.42 Type 2 diabetes mellitus with diabetic polyneuropathy 03/16/2022 No Yes L97.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 [name] Primary Care Provider: Raelene [name] Other Clinician: Massie [name] Referring Provider: Treating Provider/Extender: [name] in Treatment: 26 Subjective Chief Complaint Information obtained from Patient 03/19/2021; patient referred by [name] who has been looking after her left foot for quite [name] period of time for review of [name] nonhealing area in the left midfoot 03/12/2022; bilateral feet wounds and right lower extremity wound. History of Present Illness (HPI) 01/18/18-She is here for initial evaluation of the left great toe ulcer. She is [name] poor historian in regards to timeframe in detail. She states approximately 4 weeks ago she lacerated her toe on something in the house. She followed up with her primary care who placed her on Bactrim and ultimately [name] second dose of Bactrim prior to coming to wound clinic. She states she has been  treating the toe with peroxide, Betadine and [name] Band-Aid. She did not check her blood sugar this morning but checked it yesterday morning it was 327; she is unaware of [name] recent A1c and there are no current records. She saw Dr. she would've orthopedics last week for an old injury to the left ankle, she states he did not see her toe, nor did she bring it to his attention. She smokes approximately 1 pack cigarettes [name] day. Her social situation is concerning, she arrives this morning with her mother who appears extremely intoxicated/under the influence; her mother was asked to leave the room and be monitored by the patient's grandmother. The patient's aunt then accompanied the patient and the room throughout the rest of the appointment. We had [name] lengthy discussion regarding the deleterious effects of uncontrolled hyperglycemia and smoking as it relates to wound healing and overall health. She was strongly encouraged to decrease her smoking and get her diabetes under better control. She states she is currently on [name] diet and has cut down her [facility] consumption. The left toe is erythematous, macerated and slightly edematous with malodor present. The edema in her left foot is below her baseline, there is no erythema streaking. We will treat her with Santyl, doxycycline; we have ordered and xray, culture and provided [name] Peg assist surgical shoe and cultured the wound. 01/25/18-She is here in follow-up evaluation for [name] left great toe ulcer and presents with an abscess to her suprapubic area. She states her blood sugars remain elevated, feeling "sick" and if levels are below 250, but she is trying. She has made no attempt to decrease her smoking stating that we "can't take away her food in her cigarettes". She has been compliant with offloading using the PEG assist you. She is using Santyl daily. the culture obtained last week grew staph aureus and Enterococcus faecalis; continues on the doxycycline and Augmentin  was added on Monday. The suprapubic area has erythema, no femoral variation, purple discoloration, minimal induration, was accessed with [name] cotton tip applicator with sanguinopurulent drainage, this was cultured, I suspect the current antibiotic treatment will cover and we will not add anything to her current treatment plan. She was advised to go to urgent care or ER with any change in redness, induration or fever. 02/01/18-She is here in follow-up evaluation for left great toe ulcers and [name] new abdominal abscess from last week. She was able to use packing until  earlier this week, where she "forgot it was there". She states she was feeling ill with GI symptoms last week and was not taking her antibiotic. She states her glucose levels have been predominantly less than 200, with occasional levels between 200-250. She thinks this was contributing to her GI symptoms as they have resolved without intervention. There continues to be significant laceration to left toe, otherwise it clinically looks stable/improved. There is now less superficial opening to the lateral aspect of the great toe that was residual blister. We will transition to [facility] to all wounds, she will continue her Augmentin. If [name], [name] ([id number]) [id number] Page 11 of 19 there is no change or deterioration next week for reculture. 02/08/18-She is here in follow-up evaluation for left great toe ulcer and abdominal ulcer. There is an improvement in both wounds. She has been wrapping her left toe with coban, not by our direction, which has created an area of discoloration to the medial aspect; she has been advised to NOT use coban secondary to her neuropathy. She states her glucose levels have been high over this last week ranging from 200-350, she continues to smoke. She admits to being less compliant with her offloading shoe. We will continue with same treatment plan and she will follow-up next  week. 02/15/18-She is here in follow-up evaluation for left great toe ulcer and abdominal ulcer. The abdominal ulcer is epithelialized. The left great toe ulcer is improved and all injury from last week using the Coban wrap is resolved, the lateral ulcer is healed. She admits to noncompliance with wearing offloading shoe and admits to glucose levels being greater than 300 most of the week. She continues to smoke and expresses no desire to quit. There is one area medially that probes deeper than it has historically, erythema to the toe and dorsal foot has consistently waxed and waned. There is no overt signs of cellulitis or infection but we will culture the wound for any occult infection given the new area of depth and erythema. We will hold off on sensitivities for initiation of antibiotic therapy. 02/22/18-She is here in follow up evaluation for left great toe ulcer. There is overall significant improvement in both wound appearance, erythema and edema with changes made last week. She was not initiated on antibiotic therapy. Culture obtained last week showed oxacillin sensitive staph aureus, sensitive to clindamycin. Clindamycin has been called into the pharmacy but she has been instructed to hold off on initiation secondary to overall clinical improvement and her history of antibiotic intolerance. She has been instructed to contact the clinic with any noted changes/deterioration and the wound, erythema, edema and/or pain. She will follow-up next week. She continues to smoke and her glucose levels remain elevated >250; she admits to compliance with offloading shoe 03/01/18 on evaluation today patient appears to be doing fairly well in regard to her left first toe ulcer. She has been tolerating the dressing changes with the [facility] Dressing without complication and overall this has definitely showed signs of improvement according to records as well is what the patient tells me today. I'm very pleased  in that regard. She is having no pain today 03/08/18 She is here for follow up evaluation of [name] left great toe ulcer. She remains non-compliant with glucose control and smoking cessation; glucose levels consistently >200. She states that she got new shoe inserts/peg assist. She admits to compliance with offloading. Since my last evaluation there is significant improvement. We will switch to prisma at  this time and she will follow up next week. She is noted to be tachycardic at this appointment, heart rate 120s; she has [name] history of heart rate 70-130 according to our records. She admits to extreme agitation r/t personal issues; she was advised to monitor her heartrate and contact her physician if it does not return to [name] more normal range (<100). She takes cardizem twice daily. 03/15/18-She is here in follow-up evaluation for left great toe ulcer. She remains noncompliant with glucose control and smoking cessation. She admits to compliance with wearing offloading shoe. The ulcer is improved/stable and we will continue with the same treatment plan and she will follow-up next week 03/22/18-She is here for evaluation for left great toe ulcer. There continues to be significant improvement despite recurrent hyperglycemia (over 500 yesterday) and she continues to smoke. She has been compliant with offloading and we will continue with same treatment plan and she will follow-up next week. 03/29/18-She is here for evaluation for left great toe ulcer. Despite continuing to smoke and uncontrolled diabetes she continues to improve. She is compliant with offloading shoe. We will continue with the same treatment plan and she will follow-up next week 04/05/18- She is here in follow up evaluation for [name] left great toe ulcer; she presents with small pustule to left fifth toe (resembles ant bite). She admits to compliance with wearing offloading shoe; continues to smoke or have uncontrolled blood glucose control. There is more  callus than usual with evidence of bleeding; she denies known trauma. 04/12/18-She is here for evaluation of left great toe ulcer. Despite noncompliance with glycemic control and smoking she continues to make improvement. She continues to wear offloading shoe. The pustule, that was identified last week, to the left fifth toe is resolved. She will follow-up in 2 weeks 05/03/18-she is seen in follow-up evaluation for [name] left great toe ulcer. She is compliant with offloading, otherwise noncompliant with glycemic control and smoking. She has plateaued and there is minimal improvement noted. We will transition to [facility], replaced the insert to her surgical shoe and she will follow-up in one week 05/10/18- She is here in follow up evaluation for [name] left great toe ulcer. It appears stable despite measurement change. We will continue with same treatment plan and follow up next week. 05/24/18-She is seen in follow-up evaluation for [name] left great toe ulcer. She remains compliant with offloading, has made significant improvement in her diet, decreasing the amount of sugar/soda. She said her recent A1c was 10.9 which is lower than. She did see [name] diabetic nutritionist/educator yesterday. She continues to smoke. We will continue with the same treatment plan and she'll follow-up next week. 05/31/18- She is seen in follow-up evaluation for left great toe ulcer. She continues to remain compliant with offloading, continues to make improvement in her diet, increasing her water and decreasing the amount of sugar/soda. She does continue to smoke with no desire to quit. We will apply Prisma to the depth and Hydrofera Blue over. We have not received insurance authorization for oasis. She will follow up next week. 06/07/18-She is seen in follow-up evaluation for left great toe ulcer. It has stalled according to today's measurements although base appears stable. She says she saw [name] diabetic educator yesterday; her average blood  sugars are less than 300 which is an improvement for her. She continues to smoke and states "that's my next step" She continues with water over soda. We will order for xray, culture and reinstate ace wrap compression  prior to placing apligraf for next week. She is voicing no complaints or concerns. Her dressing will change to iodoflex over the next week in preparation for apligraf. 06/14/18-She is seen in follow-up evaluation for left great toe ulcer. Plain film x-ray performed last week was negative for osteomyelitis. Wound culture obtained last week grew strep B and OSSA; she is initiated on keflex and cefdinir today; there is erythema to the toe which could be from ace wrap compression, she has [name] history of wrapping too tight and has has been encouraged to maintain ace wraps that we place today. We will hold off on application of apligraf today, will apply next week after antibiotic therapy has been initiated. She admits today that she has resumed taking [name] shower with her foot/toe submerged in water, she has been reminded to keep foot/toe out of the bath water. She will be seen in follow up next week 06/21/18-she is seen in follow-up evaluation for left great toe ulcer. She is tolerating antibiotic therapy with no GI disturbance. The wound is stable. Apligraf was applied today. She has been decreasing her smoking, only had 4 cigarettes yesterday and 1 today. She continues being more compliant in diabetic diet. She will follow-up next week for evaluation of site, if stable will remove at 2 weeks. 06/28/18- She is here in follow up evalution. Apligraf was placed last week, she states the dressing fell off on Tuesday and she was dressing with hydrofera blue. She is healed and will be discharged from the clinic today. She has been instructed to continue with smoking cessation, continue monitoring glucose levels, offloading for an additional 4 weeks and continue with hydrofera blue for additional two weeks  for any possible microscopic opening. Readmission: 08/07/18 on evaluation today patient presents for reevaluation concerning the ulcer of her right great toe. She was previously discharged on 06/28/18 healed. Nonetheless she states that this began to show signs of drainage she subsequently went to her primary care provider. Subsequently an x-ray was performed on 08/01/18 which was negative. The patient was also placed on antibiotics at that time. Fortunately they should have been effective for the infection. Nonetheless she's been experiencing some improvement but still has [name] lot of drainage coming from the wound itself. 08/14/18 on evaluation today patient's wound actually does show signs of improvement in regard to the erythema at this point. She has completed the antibiotics. With that being said we did discuss the possibility of placing her in [name] total contact cast as of today although I think that I may want to give this just [name] little bit more time to ensure nothing recurrence as far as her infection is concerned. I do not want to put in the cast and risk infection at that time if things are not completely resolved. With that being said she is gonna require some debridement today. 08/21/18 on evaluation today patient actually appears to be doing okay in regard to her toe ulcer. She's been tolerating the dressing changes without complication. With that being said it does appear that she is ready and in fact I think it's appropriate for [place] to go ahead and initiate the total contact cast today. Nonetheless she will require some sharp debridement to prepare the wound for application. Overall I feel like things have been progressing well but we do need to do something to get this to close more readily. 08/24/18 patient seen today for reevaluation after having had the total contact cast applied on Tuesday. She seems to  have done very well the wound appears to be doing great and overall I'm pleased with the  progress that she's made. There were no abnormal areas of rubbing from the cast on her lower extremity. 08/30/18 on evaluation today patient actually appears to be completely healed in regard to her plantar toe ulcer. She tells me at this point she's been having [name] lot of issues with the cast. She almost fell [name] couple of times the state shall the step of her dog [name] couple times as well. This is been [name] very frustrating process for her other nonetheless she has completely healed the wound which is excellent news. Overall there does not appear to be the evidence of infection at this time which is great news. 09/11/18 evaluation today patient presents for follow-up concerning her great toe ulcer on the left which has unfortunately reopened since I last saw her which was only [name] couple of weeks ago. Unfortunately she was not able to get in to get the shoe and potentially the AFO that's gonna be necessary due to her left foot drop. She continues with offloading shoe but this is not enough to prevent her from reopening it appears. When we last had her in the total contact cast Shew, Chyann [name] ([id number]) [id number] Page 12 of 19 she did well from [name] healing standpoint but unfortunately the wound reopened as soon as she came out of the cast within just [name] couple of weeks. 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 This is also part of the reason why she falls on [name] regular basis. I just do not believe that is gonna be safe for [place] to reinitiate the total contact cast as last time we had this on she fell 3 times one week which is definitely not normal for her. 09/18/18 upon evaluation today the patient actually appears to be doing about the same in regard to her toe ulcer. She did not contact  [facility] as I asked her to even though I had given her the prescription. In fact she actually states that she has no idea where the prescription is. She did apparently call [facility] and they told her that all she needed to do was bring the prescription in order to be able to be seen and work on getting the AFO for her left foot. With all that being said she still does not have an appointment and I'm not sure were things stand that regard. I will give her [name] new prescription today in order to contact them to get this set up. 09/25/18 on evaluation today patient actually appears to be doing about the same in regard to her toes ulcer. She does have [name] small areas which seems to have [name] lot of callous buildup around the edge of the wound which is going to need sharp debridement today. She still is waiting to be scheduled for evaluation with [facility] for possibility of an AFO. She states there supposed to call her tomorrow to get this set up. Unfortunately it does appear that her foot specifically the toe area is showing signs of erythema. There does not appear to be any systemic infection which is in these good news. 10/02/18 on evaluation today patient actually appears to be doing about the same in regard  to her toe ulcer. This really has not done too well although it's not significantly larger it's also not significantly smaller. She has been tolerating the dressing changes without complication. She actually has her appointment with [facility] and [place] tomorrow to hopefully be measured for obtaining and AFO splint. I think this would be helpful preventing this from reoccurring. We had contemplated starting the cast this week although to be honest I am reluctant to do that as she's been having nausea, vomiting, and seizure activity over the past three days. She has [name] history of seizures and have been told is nothing that can be done for these. With that being said I do believe that along with the seizures have  the nausea vomiting which upon further questioning doesn't seem to be the normal for her and makes me concerned for the possibility of infection or something else going on. I discussed this with the patient and her mother during the office visit today. I do not feel the wound is effective but maybe something else. The responses this was "this just happens to her at times and we don't know why". They did not seem to be interested in going to the hospital to have this checked out further. 10/09/18 on evaluation today patient presents for follow-up concerning her ongoing toe ulcer. She has been tolerating the dressing changes without complication. Fortunately there does not appear to be any evidence of infection which is great news however I do think that the patient would benefit from going ahead for with the total contact cast. She's actually in [name] wheelchair today she tells me that she will use her walker if we initiate the cast. I was very specific about the fact that if we were gonna do the cast I wanted to make sure that she was using the walker in order to prevent any falls. She tells me she does not have stairs that she has to traverse on [name] regular basis at her home. She has not had any seizures since last week again that something that happens to her often she tells me she did talk to [employer] and they said that it may take up to three weeks to get the brace approved for her. Hopefully that will not take that long but nonetheless in the meantime I do think the cast could be of benefit. 10/12/18 on evaluation today patient appears to be doing rather well in regard to her toe ulcer. It's just been [name] few days and already this is significantly improved both as far as overall appearance and size. Fortunately there's no sign of infection. She is here for her first obligatory cast change. 10/19/18 Seen today for follow up and management of left great toe ulcer. Wound continues to show improvement. Noted  small open area with seroussang drainage with palpation. Denies any increased pain or recent fevers during visit. She will continue calcium alginate with offloading shoe. Denies any questions or concerns during visit. 10/26/18 on evaluation today patient appears to be doing about the same as when I last saw her in regard to her wound bed. Fortunately there does not appear to be any signs of infection. Unfortunately she continues to have [name] breakdown in regard to the toe region any time that she is not in the cast. It takes almost no time at all for this to happen. Nonetheless she still has not heard anything from the brace being made by [employer] as to when exactly this will be available to her. Fortunately  there is no signs of infection at this time. 10/30/18 on evaluation today patient presents for application of the total contact cast as we just received him this morning. Fortunately we are gonna be able to apply this to her today which is great news. She continues to have no significant pain which is good news. Overall I do feel like things have been improving while she was the cast is when she doesn't have [name] cast that things get worse. She still has not really heard anything from [facility] regarding her brace. 11/02/18 upon evaluation today patient's wound already appears to be doing significantly better which is good news. Fortunately there does not appear to be any signs of infection also good news. Overall I do think the total contact cast as before is helping to heal this area unfortunately it's just not gonna likely keep the area closed and healed without her getting her brace at least. Again the foot drop is [name] significant issue for her. 11/09/18 on evaluation today patient appears to be doing excellent in regard to her toe ulcer which in fact is completely healed. Fortunately we finally got the situation squared away with the paperwork which was needed to proceed with getting her brace approved by  Medicaid. I have filled that out unfortunately that information has been sent to the orthopedic office that I worked at 2 1/2 years ago and not tired Current wound care measures. Fortunately she seems to be doing very well at this time. 11/23/18 on evaluation today patient appears to be doing More Poorly Compared to Last Time I Saw Her. At [facility] She Had Completely Healed. Currently she is continuing to have issues with reopening. She states that she just found out that the brace was approved through Medicaid now she just has to go get measured in order to have this fitted for her and then made. Subsequently she does not have an appointment for this yet that is going to complicate things we obviously cannot put her back in the cast if we do not have everything measured because they're not gonna be able to measure her foot while she is in the cast. 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 Obviously this is unfortunate and does have me somewhat worried as well. 11/30/18 on evaluation today patient's toe ulcer actually appears to be doing fairly well. The good news is she will be getting her brace in the shoes next week on Wednesday. Hopefully we will be able to get this to heal without having to go back in the cast however she may need the cast in order to get the wound completely heal and then go from there. Fortunately there's no signs of infection at this time. 12/07/18 on evaluation today patient fortunately did receive her brace and she states she could tell this definitely makes her walk better. With that being said she's been having issues with her toe where she noticed yesterday there was [name] lot of tissue that was loosing off this appears to be much larger than what it was previous. She also states that her leg has been read putting much across the top of her foot just about the ankle although  this seems to be receiving somewhat. The total area is still red and appears to be someone infected as best I can tell. She is previously taken Bactrim and that may be  [name] good option for her today as well. We are gonna see what I wound culture shows as well and I think that this is definitely appropriate. With that being said outside of the culture I still need to initiate something in the interim and that's what I'm gonna go ahead and select Bactrim is [name] good option for her. 12/14/18 on evaluation today patient appears to be doing better in regard to her left great toe ulcer as compared to last week's evaluation. There's still some erythema although this is significantly improved which is excellent news. Overall I do believe that she is making good progress is still gonna take some time before she is where I would like her to be from the standpoint of being able to place her back into the total contact cast. Hopefully we will be where we need to be by next week. 12/21/18 on evaluation today patient actually appears to be doing poorly in regard to her toe ulcer. She's been tolerating the dressing changes without complication. Fortunately there's no signs of systemic infection although she does have [name] lot of drainage from the toe ulcer and this does seem to be causing some issues at this point. She does have erythema on the distal portion of her toe that appears to be likely cellulitis. 12/28/18 on evaluation today patient actually appears to be doing [name] little better in my pinion in regard to her toe ulcer. With that being said she still does have some evidence of infection at this time and for her culture she had both E. coli as well as enterococcus as organisms noted on evaluation. For that reason I think that though the Keflex likely has treated the E. coli rather well this has really done nothing for the enterococcus. We are going to have to initiate treatment for this specifically. 01/04/19 on  evaluation today patient's toe actually appears to be doing better from the standpoint of infection. She currently would like to see about putting the MARELLA, VANDERPOL [name] ([id number]) [id number] Page 13 of 19 cash back on I think that this is appropriate as long as she takes care of it and keeps it from getting wet. She is gonna have some drainage we can definitely pass this up with Drawtex and alginate to try to prevent as much drainage as possible from causing the problems. With that being said I do want to at least try her with the cast between now and Tuesday. If there any issues we can't continue to use it then I will discontinue the use of the cast at that point. 01/08/19 on evaluation today patient actually appears to be doing very well as far as her foot ulcer specifically the great toe on the left is concerned. She did have an area of rubbing on the medial aspect of her left ankle which again is from the cast. Fortunately there's no signs of infection at this point in this appears to be [name] very slight skin breakdown. The patient tells me she felt it rubbing but didn't think it was that bad. Fortunately there is no signs of active infection at this time which is good news. No fevers, chills, nausea, or vomiting noted at this time. 01/15/19 on evaluation today patient actually appears to be doing well in regard to her toe ulcer. Again as previous she seems to do well and she has the cast on which indicates to me that during the time she doesn't have [name] cast on she's putting way too much  pressure on this region. Obviously I think that's gonna be an issue as with the current national emergency concerning the Covid-19 Virus it has been recommended that we discontinue the use of total contact casting by the chief medical officer of our company, Dr. Simona Huh. The reasoning is that if [name] patient becomes sick and cannot come into have the cast removed they could not just leave this on  for an additional two weeks. Obviously the hospitals also do not want to receive patient's who are sick into the emergency department to potentially contaminate the region and spread the Covid-19 Virus among other sick individuals within the hospital system. Therefore at this point we are suspending the use of total contact cast until the current emergency subsides. This was all discussed with the patient today as well. 01/22/19 on evaluation today patient's wound on her left great toe appears to be doing slightly worse than previously noted last week. She tells me that she has been on this quite [name] bit in fact she tells me she's been awake for 38 straight hours. This is due to the fact that she's having to care for grandparents because nobody else will. She has been taking care of them for five the last seven days since I've seen her they both have dementia his is from [name] stroke and her grandmother's was progressive. Nonetheless she states even her mom who knows her condition and situation has only help two of those days to take care of them she's been taking care of the rest. Fortunately there does not appear to be any signs of active infection in regard to her toe at this point although obviously it doesn't look as good as it did previous. I think this is directly related to her not taking off the pressure and friction by way of taking things easy. Though I completely understand what's going on. 01/29/19 on evaluation today patient's tools are actually appears to be showing some signs of improvement today compared to last week's evaluation as far as not necessarily the overall size of the wound but the fact that she has some new skin growth in between the two ends of the wound opening. Overall I feel like she has done well she states that she had [name] family member give her what sounds to be [name] CAM walker boot which has been helpful as well. 02/05/19 on evaluation today patient's wound bed actually appears to  be doing significantly better in regard to her overall appearance of the size of the wound. With that being said she is still having an issue with offloading efficiently enough to get this to close. Apparently there is some signs of infection at this point as well unfortunately. Previously she's done well of Augmentin I really do not see anything that needs to be culture currently but there theme and cellulitis of the foot that I'm seeing I'm gonna go ahead and place her on an antibiotic today to try to help clear this up. 02/12/2019 on evaluation today patient actually appears to be doing poorly in regard to her overall wound status. She tells me she has been using her offloading shoe but actually comes in today wearing her tennis shoe with the AFO brace. Again as I previously discussed with her this is really not sufficient to allow the area to heal appropriately. Nonetheless she continues to be somewhat noncompliant and I do wonder based on what she has told my nurse in the past as to whether or not [name]  good portion of this noncompliance may be recreational drug and alcohol related. She has had [name] history of heroin overdose and this was fairly recently in the past couple of months that have been seeing her. Nonetheless overall I feel like her wound looks significantly worse today compared to what it was previous. She still has significant erythema despite the Augmentin I am not sure that this is an appropriate medication for her infection I am also concerned that the infection may have gone down into her bone. 02/19/19 on evaluation today patient actually appears to be doing about the same in regard to her toe ulcer. Unfortunately she continues to show signs of bone exposure and infection at this point. There does not appear to be any evidence of worsening of the infection but I'm also not really sure that it's getting significantly better. She is on the Augmentin which should be sufficient for the  Staphylococcus aureus infection that she has at this point. With that being said she may need IV antibiotics to more appropriately treat this. We did have [name] discussion today about hyperbaric option therapy. 02/28/19 on evaluation today patient actually appears to be doing much worse in regard to the wound on her left great toe as compared to even my previous evaluation last week. Unfortunately this seems to be training in [name] pretty poor direction. Her toe was actually now starting to angle laterally and I can actually see the entire joint area of the proximal portion of the digit where is the distal portion of the digit again is no longer even in contact with the joint line. Unfortunately there's [name] lot more necrotic tissue around the edge and the toe appears to be showing signs of becoming gangrenous in my pinion. I'm very concerned about were things stand at this point. She did see infectious disease and they are planning to send in [name] prescription for Sivextro for her and apparently this has been approved. With that being said I don't think she should avoid taking this but at the same time I'm not sure that it's gonna be sufficient to save her toe at this point. She tells me that she still having to care for grandparents which I think is putting quite [name] bit of strain on her foot and specifically the total area and has caused this to break down even to [name] greater degree than would've otherwise been expected. 03/05/19 on evaluation today patient actually appears to be doing quite well in regard to her toe all things considering. She still has bone exposed but there appears to be much less your thing on overall the appearance of the wound and the toe itself is dramatically improved. She still does have some issues currently obviously with infection she did see vascular as well and there concerned that her blood flow to the toad. For that reason they are setting up for an angiogram next week. 03/14/19 on  evaluation today patient appears to be doing very poor in regard to her toe and specifically in regard to the ulceration and the fact that she's starting to notice the toe was leaning even more towards the lateral aspect and the complete joint is visible on the proximal aspect of the joint. Nonetheless she's also noted [name] significant odor and the tip of the toe is turning more dark and necrotic appearing. Overall I think she is getting worse not better as far as this is concerned. For that reason I am recommending at this point that she likely  needs to be seen for likely amputation. READMISSION 03/19/2021 This is [name] patient that we cared for in this clinic for [name] prolonged period of time in 2019 and 2020 with [name] left foot and left first toe wound. 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 In December 2021 she had an ulcer on her right great toe as well as the fourth and fifth toes. 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 She also had an angiogram at that time and underwent angioplasty of the right anterior tibial artery. In any case she claims that the wound on the right foot is closed I did not look at this today which was probably an oversight although I think that should be done next week. 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 According to Dr. Deborra Medina last review that she was out of the area being cared for by another physician but recently came back to his attention. 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 [name] culture of this area showed E. coli apparently before she came back to see [name] she was supposed to be receiving antibiotics but she did not really take them. Nor is she offloading this area at all. Finally her last hemoglobin A1c listed in epic was in March 2022 at 14.1  she says things are [name] lot better since then although I am not sure. 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 She had this wound at the time. During this admission she had E. coli sepsis [name] TEE was negative. 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 She is still [name] heavy cigarette smoker. She has [name] history of bipolar disorder seizure disorder ABI in our clinic was 1.05 6/1; left midfoot in the setting of [name] TMA done previously. Round circular wound with [name] "knuckle" of protruding tissue. The problem is that the knuckle was not attached to any of the surrounding granulation and this probed proximally widely I removed [name] large portion of this tissue. This wound goes with considerable undermining laterally. I do not feel any bone there was no purulence but this is [name] deep wound. 6/8; in spite of the debridement I did last week. She arrives with [name] wound looking exactly the same. [name] protruding "knuckle" of tissue nonadherent to most of the KALIOPE, QUINONEZ [name] ([id number]) [id number] Page 14 of 19 surrounding tissue. There is considerable depth around this from 6-12 o'clock at 2.7 cm and undermining of 1 cm. This does not look overtly infected and the x- ray I did last week was negative for any osseous abnormalities. We have been using silver collagen 6/15; deep tissue culture I did last week showed moderate staph aureus and moderate Pseudomonas. This will definitely require prolonged antibiotic therapy. The pathology on the protuberant area was negative for malignancy fungus etc. the comment was chronic ulceration with exuberant fibrin necrotic  debris and negative for malignancy. We have been using silver collagen. I am going to be prescribing Levaquin for 2 weeks. Her CT scan of the foot  is down for 7/5 6/22; CT scan of the foot on 7 5. She says she has hardware in the left leg from her previous fracture. She is on the Levaquin for the deep tissue culture I did that showed methicillin sensitive staph aureus and Pseudomonas. I gave her [name] 2-week supply and she will have another week. She arrives in clinic today with the same protuberant tissue however this is nonadherent to the tissue surrounding it. I am really at [name] loss to explain this unless there is underlying deep tissue infection 6/29; patient presents for 1 week follow-up. She has been using collagen to the wound bed. She reports taking her antibiotics as prescribed.She has no complaints or issues today. She denies signs of infection. 7/6; patient presents for one week followup. She has been using collagen to the wound bed. She states she is taking Levaquin however at times she is not able to keep it down. She denies signs of infection. 7/13; patient presents for 1 week follow-up. She has been using silver alginate to the wound bed. She still has nausea when taking her antibiotics. She denies signs of infection. 7/20; patient presents for 1 week follow-up. She has been using silver alginate with gentamicin cream to the wound bed. She denies any issues and has no complaints today. She denies signs of infection. 7/27; patient presents for 1 week follow-up. She continues to use silver alginate with gentamicin cream to the wound bed. She reports starting her antibiotics. She has no issues or complaints. Overall she reports stability to the wound. 8/3; patient presents for 1 week follow-up. She has been using silver alginate with gentamicin cream to the wound bed. She reports completing all antibiotics. She has no issues or complaints today. She denies signs of infection. 8/17; patient  presents for 2-week follow-up. He is to use silver alginate to the wound bed. She has no issues or complaints today. She denies signs of infection. She reports her pain has improved in her foot since last clinic visit 8/24; patient presents for 1 week follow-up. She continues to use silver alginate to the wound bed. She has no issues or complaints. She denies signs of infection. Pain is stable. 9/7; patient presents for follow-up. She missed her last week appointment due to feeling ill. She continues to use silver alginate. She has [name] new wound to the right lower extremity that is covered in eschar. She states It occurred over the past week and has no idea how it started. She currently denies signs of infection. 9/14; patient presents for follow-up. T the left foot wound she has been using gentamicin cream and silver alginate. T the right lower extremity wound she has o o been keeping this covered and has not obtain Santyl. 9/21; patient presents for follow-up. She reports using gentamicin cream and silver alginate to the left foot and Santyl to the right lower extremity wound. She has no issues or complaints today. She denies signs of infection. 9/28; patient presents for follow-up. She reports [name] new wound to her right heel. She states this occurred [name] few days ago and is progressively gotten worse. She has been trying to clean the area with [name] Q-tip and Santyl. She reports stability in the other 2 wounds. She has been using gentamicin cream and silver alginate to the left foot and Santyl to the right lower extremity wound. 10/12; patient presents for follow-up. She reports improvement to the wound beds. She is seeing vein and vascular to discuss the potential  of [name] left BKA. She states they are going to do an arteriogram. She continues to use silver alginate with dressing changes to her wounds. 11/2; patient presents for follow-up. She states she has not been doing dressing changes to the wound beds.  She states she is not able to offload the areas. She reports chronic pain to her left foot wound. 11/9; patient presents for follow-up. She came in with only socks on. She states she forgot to put on shoes. It is unclear if she is doing any dressing changes. She currently denies systemic signs of infection. 11/16; patient presents for follow-up. She came again only with socks on. She states she does not wear shoes ever. It is unclear if she does dressing changes. She currently denies systemic signs of infection. 11/23; patient presents for follow-up. She wore her shoes today. It still unclear exactly what dressing she is using for each wound but she did states she obtained Dakin's solution and has been using this to the left foot wound. She currently denies signs of infection. 11/30; patient presents for follow-up. She has no issues or complaints today. She currently denies signs of infection. 12/7; patient presents for follow-up. She has no issues or complaints today. She has been using Hydrofera Blue to the right heel wound and Dakin solution to the left foot wound. Her right anterior leg wound is healed. She currently denies signs of infection. 12/14; patient presents for follow-up. She has been using Hydrofera Blue to the right heel and Dakin's to the left foot wounds. She has no issues or complaints today. She denies signs of infection. 12/21; patient presents for follow-up. She reports using Hydrofera Blue to the right heel and Dakin's to the left foot wound. She denies signs of infection. 12/28; patient presents for follow-up. She continues to use Dakin's to the left foot wound and Hydrofera Blue to the right heel wound. She denies signs of infection. 1/4; patient presents for follow-up. She has no issues or complaints today. She denies signs of infection. 1/11; patient presents for follow-up. It is unclear if she has been dressing these wounds over the past week. She currently denies signs  of infection. 1/18; patient presents for follow-up. She states she has been using Dakin's wet-to-dry dressings to the left foot. She has been using Hydrofera Blue to the right foot foot wound. She states that the anterior right leg wound has reopened and draining serous fluid. She denies signs of infection. 1/25; patient presents for follow-up. She has no issues or complaints today. 2/1; patient presents for follow-up. She has no issues or complaints today. She denies signs of infection. 2/8; patient presents for follow-up. She has lost her surgical shoes. She did not have [name] dressing to the right heel wound. She currently denies signs of infection. 2/15; patient presents for follow-up. She reports more pain to the right heel today. She denies purulent drainage Or fever/chills 2/22; patient presents for follow-up. She reports taking clindamycin over the past week. She states that she continues to have pain to her right heel. She reports purulent drainage. Readmission 03/16/2022 Ms. Regena Delucchi is [name] 46 year old female with [name] past medical history of type 2 diabetes, osteomyelitis to her feet, chronic systolic heart failure and bipolar disorder that presents to the clinic for bilateral feet wounds and right lower extremity wound. She was last seen in our clinic on 12/15/2021. At that time she had purulent drainage coming out of her right plantar foot and I recommended she go  to the ED. She states she went to [facility] and has been there for the past 3 months. I cannot see the records. She states she had OR debridement and was on several weeks of IV antibiotics while inpatient. Since discharge she has not been taking care of the wound beds. She had nothing on her feet other than socks today. She currently denies signs of infection. 5/31; patient presents for follow-up. She has been using Dakin's wet-to-dry dressings to the wound beds on her feet bilaterally and antibiotic ointment to  the YANCY, [name] [name] ([id number]) [id number] Page 15 of 19 right anterior leg wound. She had [name] wound culture done at last clinic visit that showed moderate Pseudomonas aeruginosa sensitive to ciprofloxacin. She currently denies systemic signs of infection. 6/14; patient presents for follow-up. She received Keystone 5 days ago and has been using this on the wound beds. She states that last week she had to go to the hospital because she had increased warmth and erythema to the right foot. She was started on 2 oral antibiotics. She states she has been taking these. She currently denies systemic signs of infection. She has no issues or complaints today. 6/21; patient presents for follow-up. She states she has been using Keystone antibiotics to the wound beds. She has no issues or complaints today. She denies signs of infection. 6/28; patient presents for follow-up. She has been using Keystone antibiotics to the wound beds. She has no issues or complaints today. 7/12; patient presents for follow-up. Has been using Keystone antibiotics to the wound beds with calcium alginate. She has no issues or complaints today. She never followed up with her orthopedic surgeon who did the OR debridement to the right foot. We discussed the total contact cast for the left foot and patient would like to do this next week. 7/19; patient presents for follow-up. She has been using Keystone antibiotics with calcium alginate to the wound beds. She has no issues or complaints today. Patient is in agreement to do the total contact cast of the left foot today. She knows to return later this week for the obligatory cast change. 05-13-2022 upon evaluation today patient's wound which she has the cast of the left leg actually appears to be doing significantly better. Fortunately I do not see any signs of active infection locally or systemically which is great news and overall I am extremely pleased with  where we stand currently. 7/26; patient presents for follow-up. She has [name] cast in place for the past week. She states it irritated her shin. Other than that she tolerated the cast well. She states she would like [name] break for 1 week from the cast. We have been using Keystone antibiotic and Aquacel to both wound beds. She denies signs of infection. 8/2; patient presents for follow-up. She has been using Keystone and Aquacel to the wound beds. She denies any issues and has no complaints. She is agreeable to have the cast placed today for the left leg. 06-03-2022 upon evaluation today patient appears to be doing well with regard to her wound she saw some signs of improvement which is great news. Fortunately I do not see any evidence of active infection locally or systemically at this time which is great news. No fevers, chills, nausea, vomiting, or diarrhea. 8/16; patient presents for follow-up. She has no issues or complaints today. We have been using Keystone and Aquacel to the wound beds. The left lower extremity is in [name] total contact cast. She  is tolerated this well. 8/23; patient presents for follow-up. She has had the total contact cast on the left leg for the past week. Unfortunately this has rubbed and broken down the skin to the medial foot. She currently denies signs of infection. She has been using Keystone antibiotic to the right plantar foot wound. 8/30; patient presents for follow-up. We have held off on the total contact cast for the left leg for the past week. Her wound on the left foot has improved and the previous surrounding breakdown of skin has epithelialized. She has been using Keystone antibiotic to both wound beds. She has no issues or complaints today. She denies signs of infection. 9/6; patient presents for follow-up. She has ordered her's Keystone antibiotic and this is arriving this week. She has been doing Dakin's wet-to-dry dressings to the wound beds. She denies signs of  infection. She is agreeable to the total contact cast today. 9/13; patient presents for follow-up. She states that the cast caused her left leg shin to be sore. She would like to take [name] break from the cast this week. She has been using Keystone antibiotic to the right plantar foot wound. She denies signs of infection. 9/20; patient presents for follow-up. She has been using Keystone antibiotics to the wound beds with calcium alginate to the right foot wound and Hydrofera Blue to the left foot wound. She is agreeable to having the cast placed today. She has been approved for Apligraf and we will order this for next clinic visit. 9/27; patient presents for follow-up. We have been using Keystone antibiotic with Hydrofera Blue to the left foot wound under [name] total contact cast. T the right o foot wound she has been using Keystone antibiotic and calcium alginate. She declines [name] total contact cast today. Apligraf is available for placement and she would like to proceed with this. 07-28-2022 upon evaluation today patient appears to be doing well currently in regard to her wound. She is actually showing signs of significant improvement which is great news. Fortunately I do not see any evidence of active infection locally nor systemically at this time. She has been seeing Dr. Heber Thorp and to be honest has been doing very well with the cast. Subsequently she comes in today with [name] cast on and we did reapply that today as well. She did not really want to she try to talk me out of that but I explained that if she wanted to heal this is really the right way to go. Patient voiced understanding. In regard to her right foot this is actually [name] lot better compared to the last time I saw her which is also great news. 10/11; patient presents for follow-up. Apligraf and the total contact cast was placed to the left leg at last clinic visit. She states that her right foot wound had burning pain to it with the placement of  Apligraf to this area. She has been doing Agency over this area. She denies signs of infection including increased warmth, erythema or purulent drainage. 11/1; 3-week follow-up. The patient fortunately did not have [name] total contact cast or an Apligraf and on the left foot. She has been using Keystone ABD pads and kerlix and her own running shoes She arrives in clinic today with thick callus and [name] very poor surface on the left foot on the right nonviable skin subcutaneous tissue and [name] deep probing hole. 11/15; patient missed her last clinic appointment. She states she has not been dressing the wound  beds for the past 2 weeks. She states that at she had [name] new roommate but is now going back to live with her mother. Apparently its been [name] distracting 2 weeks. Patient currently denies signs of infection. 11/22; patient presents for follow-up. She states she has been using Keystone antibiotic and Dakin's wet-to-dry dressings to the wound beds. She is agreeable for cast placement today. We had ordered Apligraf however this has not been received by our facility. Objective Constitutional Vitals Time Taken: 10:28 AM, Height: 69 in, Weight: 178 lbs, BMI: 26.3, Temperature: 97.4 F, Pulse: 99 bpm, Respiratory Rate: 16 breaths/min, Blood Pressure: 141/75 mmHg. [name], OFFNER [name] ([id number]) [id number] Page 16 of 19 General Notes: Right foot: T the plantar heel there is an incision site with increased depth.T the opening there is granulation tissue. Left foot: T the medial o o o aspect there is an open wound with granulation tissue and nonviable tissue with surrounding callus. No overt signs of infection to any of the wound beds. Integumentary (Hair, Skin) Wound #11 status is Open. Original cause of wound was Surgical Injury. The date acquired was: 12/01/2021. The wound has been in treatment 26 weeks. The wound is located on the [place]. The wound measures 0.6cm length x  0.7cm width x 0.6cm depth; 0.33cm^2 area and 0.198cm^3 volume. There is Fat Layer (Subcutaneous Tissue) exposed. There is undermining starting at 6:00 and ending at 4:00 with [name] maximum distance of 0.7cm. There is [name] medium amount of serosanguineous drainage noted. The wound margin is distinct with the outline attached to the wound base. There is medium (34-66%) pink, pale granulation within the wound bed. There is [name] medium (34-66%) amount of necrotic tissue within the wound bed. Wound #12 status is Open. Original cause of wound was Pressure Injury. The date acquired was: 03/16/2020. The wound has been in treatment 26 weeks. The wound is located on the [place]. The wound measures 2cm length x 1.2cm width x 0.4cm depth; 1.885cm^2 area and 0.754cm^3 volume. There is Fat Layer (Subcutaneous Tissue) exposed. There is no tunneling or undermining noted. There is [name] medium amount of serous drainage noted. The wound margin is flat and intact. There is large (67-100%) red, pink granulation within the wound bed. There is no necrotic tissue within the wound bed. 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 However the appearance of the wound beds are healthier today. I debrided nonviable tissue. Unfortunately we do not have the skin substitute today. We will try and have this for next clinic visit. For now I recommended Keystone antibiotic with Aquacel Ag to the left foot under the total contact cast. Continue with [facility] antibiotic and Aquacel Ag to the right plantar foot wound. Continue aggressive offloading to the right foot. She  has [name] surgical shoe with Pegasys. Follow-up in 1 week. Procedures Wound #12 Pre-procedure diagnosis of Wound #12 is [name] Diabetic Wound/Ulcer of the Lower Extremity located on the Left,Medial,Plantar Foot .Severity of Tissue Pre Debridement is: Fat layer exposed. There was [name] Excisional Skin/Subcutaneous Tissue Debridement with [name] total area of 2.4 sq cm performed by Heather Shan, MD. With the following instrument(s): Curette to remove Viable and Non-Viable tissue/material.  Material removed includes Subcutaneous Tissue, Slough, and Biofilm. No specimens were taken. [name] time out was conducted at 10:45, prior to the start of the procedure. [name] Minimum amount of bleeding was controlled with Pressure. The procedure was tolerated well. Post Debridement Measurements: 2cm length x 1.2cm width x 0.4cm depth; 0.754cm^3 volume. Character of Wound/Ulcer Post Debridement is stable. Severity of Tissue Post Debridement is: Fat layer exposed. Post procedure Diagnosis Wound #12: Same as Pre-Procedure Plan Follow-up Appointments: Return Appointment in 1 week. Nurse Visit as needed Bathing/ Shower/ Hygiene: May shower with wound dressing protected with water repellent cover or cast protector. No tub bath. Anesthetic (Use 'Patient Medications' Section for Anesthetic Order Entry): Lidocaine applied to wound bed Edema Control - Lymphedema / Segmental Compressive Device / Other: Elevate, Exercise Daily and Avoid Standing for Long Periods of Time. Elevate legs to the level of the heart and pump ankles as often as possible Elevate leg(s) parallel to the floor when sitting. Off-Loading: T Contact Cast to Left Lower Extremity otal Open toe surgical shoe - Heel off loader - Right foot Other: - keep pressure off of feet. Medications-Please add to medication list.: Wound #12 Left,Medial,Plantar Foot: Keystone Compound - Keystone gel right foot WOUND #11: - Foot Wound Laterality: Plantar, Right Cleanser: Dakin 16 (oz)  0.25 1 x Per Day/30 Days Discharge Instructions: Use as directed. Cleanser: Soap and Water 1 x Per Day/30 Days Discharge Instructions: Gently cleanse wound with antibacterial soap, rinse and pat dry prior to dressing wounds Topical: keystone gel 1 x Per Day/30 Days Prim Dressing: Aquacel Extra Hydrofiber Dressing, 2x2 (in/in) 1 x Per Day/30 Days ary Secondary Dressing: ABD Pad 5x9 (in/in) (Generic) 1 x Per Day/30 Days Discharge Instructions: Cover with ABD pad MAURICE, RAMSEUR [name] ([id number]) [id number] Page 17 of 19 Secured With: Medipore T - 59M Medipore H Soft Cloth Surgical T ape ape, 2x2 (in/yd) (Generic) 1 x Per Day/30 Days Secured With: Kerlix Roll Sterile or Non-Sterile 6-ply 4.5x4 (yd/yd) (Generic) 1 x Per Day/30 Days Discharge Instructions: Apply Kerlix as directed WOUND #12: - Foot Wound Laterality: Plantar, Left, Medial Cleanser: Dakin 16 (oz) 0.25 (Dispense As Written) 1 x Per Day/30 Days Discharge Instructions: Use as directed. Topical: Vitamin AandD Ointment, 4(oz) tube 1 x Per Day/30 Days Discharge Instructions: apply to red, broken down area Prim Dressing: Gauze 1 x Per Day/30 Days ary Discharge Instructions: moisten with Dakins solution Secondary Dressing: ABD Pad 5x9 (in/in) (Generic) 1 x Per Day/30 Days Discharge Instructions: Cover with ABD pad Secured With: Medipore T - 59M Medipore H Soft Cloth Surgical T ape ape, 2x2 (in/yd) (Generic) 1 x Per Day/30 Days Secured With: Kerlix Roll Sterile or Non-Sterile 6-ply 4.5x4 (yd/yd) (Generic) 1 x Per Day/30 Days Discharge Instructions: Apply Kerlix as directed 1. In office sharp debridement 2. Silver alginate with Keystone antibiotic Ointment 3. T contact castooleft foot otal 4. surgical shoe with pegasys - right foot Electronic Signature(s) Signed: 09/14/2022 12:14:43 PM By: Heather Shan DO Entered By: [name] on 09/14/2022  11:45:03 -------------------------------------------------------------------------------- ROS/PFSH Details Patient Name: Date of Service: [name], Peter [name]. 09/14/2022 10:30 [name]: [id number] Patient Account Number: [id number] Date of Birth/Sex: Treating RN: 06-25-1976 (46 y.o. [name] Primary Care Provider: Raelene [name] Other Clinician: Massie [name] Referring Provider: Treating Provider/Extender: [name] in Treatment: 26 Information Obtained From Patient Ear/Nose/Mouth/Throat Medical History: Positive for: Chronic sinus problems/congestion; Middle ear problems Hematologic/Lymphatic Medical History: Positive for: Anemia Respiratory Medical History: Positive for:  Chronic Obstructive Pulmonary Disease (COPD) Cardiovascular Medical History: Positive for: Congestive Heart Failure - EF 33% Endocrine Medical History: Positive for: Type II Diabetes BERIT, RACZKOWSKI [name] ([id number]) [id number] Page 18 of 19 Time with diabetes: 14 years Treated with: Insulin, Oral agents Blood sugar tested every day: No Blood sugar testing results: Bedtime: 176 Genitourinary Medical History: Positive for: End Stage Renal Disease Integumentary (Skin) Medical History: Positive for: History of pressure wounds Neurologic Medical History: Positive for: Neuropathy - feel and legs HBO Extended History Items Ear/Nose/Mouth/Throat: Ear/Nose/Mouth/Throat: Chronic sinus Middle ear problems problems/congestion Immunizations Pneumococcal Vaccine: Received Pneumococcal Vaccination: No Implantable Devices No devices added Family and Social History Current every day smoker; Marital Status - Single; Alcohol Use: Never; Drug Use: Prior History; Caffeine Use: Daily Electronic Signature(s) Signed: 09/14/2022 12:14:43 PM By: Heather Shan DO Signed: 09/14/2022 5:38:08 PM By: [name] Entered By:  [name] on 09/14/2022 11:46:04 -------------------------------------------------------------------------------- [place] Details Patient Name: Date of Service: Heather Hitch [name]. 09/14/2022 Medical Record Number: [id number] Patient Account Number: [id number] Date of Birth/Sex: Treating RN: 1975/11/27 (46 y.o. 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.621 Type 2 diabetes mellitus with foot ulcer E11.42 Type 2 diabetes mellitus with diabetic polyneuropathy ITATI, BROCKSMITH [name] ([id number]) [id number] Page 19 of 19 L97.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 of skin Facility Procedures : CPT4 Code: [id number] Description: 61607 - DEB SUBQ TISSUE 20 SQ CM/< ICD-10 Diagnosis Description 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 seve Modifier: rity Quantity: 1 Physician Procedures : CPT4 Code Description Modifier [id number] 11042 - WC PHYS SUBQ TISS 20 SQ CM ICD-10 Diagnosis Description 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ity Quantity: 1 Electronic Signature(s) Signed: 09/14/2022 12:14:43 PM By: Heather Shan DO Entered By: [name] on 09/14/2022 11:45:33

## 2022-09-15 NOTE — Progress Notes (Signed)
Heather Heather, Heather Heather ([id number]) [id number] Page 1 of 10 Visit Report for 09/14/2022 Arrival Information Details Patient Name: Date of Service: Heather Heather, Heather Heather 09/14/2022 10:30 Heather Heather Medical Record Number: [id number] Patient Account Number: [id number] Date of Birth/Sex: Treating RN: 1976-05-01 (46 y.o. Heather Heather, [name] Primary Care Heather Heather: Heather Heather Other Clinician: Massie [name] Referring Heather Heather: Treating Heather Heather/Extender: Heather Heather in Treatment: 26 Visit Information History Since Last Visit Added or deleted any medications: No Patient Arrived: Wheel Chair Has Dressing in Place as Prescribed: Yes Arrival Time: 10:27 Has Footwear/Offloading in Place as Prescribed: Yes Accompanied By: mom Left: Wedge Shoe Transfer Assistance: None Right: Wedge Shoe Patient Identification Verified: Yes Pain Present Now: No Secondary Verification Process Completed: Yes Patient Requires Transmission-Based Precautions: No Patient Has Alerts: No Electronic Signature(s) Signed: 09/14/2022 5:38:08 PM By: Heather Heather, BSN, RN, CWS, [name] RN, BSN Entered By: Heather Heather, BSN, RN, CWS, [name] on 09/14/2022 10:28:15 -------------------------------------------------------------------------------- Encounter Discharge Information Details Patient Name: Date of Service: Heather Hitch Heather. 09/14/2022 10:30 Heather Heather Medical Record Number: [id number] Patient Account Number: [id number] Date of Birth/Sex: Treating RN: 06-Jun-1976 (46 y.o. Heather Heather Primary Care Heather Heather: Heather Heather Other Clinician: Massie [name] Referring Heather Heather: Treating Heather Heather/Extender: Heather Heather in Treatment: 26 Encounter Discharge Information Items Post Procedure Vitals Discharge Condition: Stable Unable to obtain vitals Reason: limited timing Ambulatory Status: Ambulatory Discharge Destination: Home Transportation: Private Auto Accompanied By: self Schedule  Follow-up Appointment: Yes Clinical Summary of Care: Electronic Signature(s) Signed: 09/14/2022 5:38:08 PM By: Heather Heather, BSN, RN, CWS, [name] RN, BSN [place], Heather Heather ([id number]) [id number] Page 2 of 10 Entered By: Heather Heather, BSN, RN, CWS, [name] on 09/14/2022 11:54:57 -------------------------------------------------------------------------------- Lower Extremity Assessment Details Patient Name: Date of Service: Heather Heather, Heather Heather 09/14/2022 10:30 Heather Heather Medical Record Number: [id number] Patient Account Number: [id number] Date of Birth/Sex: Treating RN: 03/12/1976 (46 y.o. Heather Heather Primary Care Heather Heather: Heather Heather Other Clinician: Massie [name] Referring Heather Heather: Treating Heather Heather/Extender: Heather [name] in Treatment: 26 Edema Assessment Assessed: [Left: No] [Right: No] Edema: [Left: No] [Right: No] Vascular Assessment Pulses: Dorsalis Pedis Palpable: [Left:Yes] [Right:Yes] Electronic Signature(s) Signed: 09/14/2022 5:38:08 PM By: Heather Heather, BSN, RN, CWS, [name] RN, BSN Entered By: Heather Heather, BSN, RN, CWS, [name] on 09/14/2022 10:39:29 -------------------------------------------------------------------------------- Multi Wound Chart Details Patient Name: Date of Service: Heather Hitch Heather. 09/14/2022 10:30 Heather Heather Medical Record Number: [id number] Patient Account Number: [id number] Date of Birth/Sex: Treating RN: 12/27/1975 (46 y.o. Heather Heather Primary Care Adajah Cocking: Heather Heather Other Clinician: Massie [name] Referring Heather Heather: Treating Heather Heather/Extender: Heather Heather in Treatment: 26 Vital Signs Height(in): 69 Pulse(bpm): 99 Weight(lbs): 178 Blood Pressure(mmHg): 141/75 Body Mass Index(BMI): 26.3 Temperature(F): 97.4 Respiratory Rate(breaths/min): 16 [11:Photos:] [N/Heather:N/Heather [id number] Page 3 of 10] Right, Plantar Foot Left, Medial, Plantar Foot N/Heather Wound Location: Surgical Injury  Pressure Injury N/Heather Wounding Event: Open Surgical Wound 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 Etiology: Extremity Chronic sinus problems/congestion, Chronic sinus problems/congestion, N/Heather Comorbid History: Middle ear problems, Anemia, Chronic Middle ear problems, Anemia, Chronic Obstructive Pulmonary Disease Obstructive Pulmonary Disease (COPD), Congestive Heart Failure, (COPD), Congestive Heart Failure, Type II Diabetes, End Stage Renal Type II Diabetes, End Stage Renal Disease, History of pressure wounds, Disease, History of pressure wounds, Neuropathy Neuropathy 12/01/2021 03/16/2020 N/Heather Date Acquired: 72 26 N/[name] of Treatment: Open Open N/Heather Wound Status: No No N/Heather Wound Recurrence: 0.6x0.7x0.6 2x1.2x0.4 N/Heather Measurements L x W x D (cm) 0.33 1.885 N/Heather Heather (cm) :  rea 0.198 0.754 N/Heather Volume (cm) : 82.10% 60.00% N/Heather % Reduction in Heather rea: 88.10% 20.00% N/Heather % Reduction in Volume: 6 Starting Position 1 (o'clock): 4 Ending Position 1 (o'clock): 0.7 Maximum Distance 1 (cm): Yes No N/Heather Undermining: Full Thickness Without Exposed Grade 3 N/Heather Classification: Support Structures Medium Medium N/Heather Exudate Amount: Serosanguineous Serous N/Heather Exudate Type: red, brown amber N/Heather Exudate Color: Distinct, outline attached Flat and Intact N/Heather Wound Margin: Medium (34-66%) Large (67-100%) N/Heather Granulation Amount: Pink, Pale Red, Pink N/Heather Granulation Quality: Medium (34-66%) None Present (0%) N/Heather Necrotic Amount: Fat Layer (Subcutaneous Tissue): Yes Fat Layer (Subcutaneous Tissue): Yes N/Heather Exposed Structures: Fascia: No Fascia: No Tendon: No Tendon: No Muscle: No Muscle: No Joint: No Joint: No Bone: No Bone: No Medium (34-66%) Small (1-33%) N/Heather Epithelialization: Treatment Notes Electronic Signature(s) Signed: 09/14/2022 5:38:08 PM By: Heather Heather, BSN, RN, CWS, [name] RN, BSN Entered By: Heather Heather, BSN, RN, CWS, [name] on 09/14/2022  10:43:48 -------------------------------------------------------------------------------- [place] Details Patient Name: Date of Service: Heather Hitch Heather. 09/14/2022 10:30 Heather Heather Medical Record Number: [id number] Patient Account Number: [id number] Date of Birth/Sex: Treating RN: [birth year] (46 y.o. Heather Heather Primary Care Rustin Erhart: Heather Heather Other Clinician: Massie [name] Referring Kyndal Heringer: Treating Ronte Parker/Extender: Heather Heather in Treatment: [address] [place]AK[place], OSGOOD Heather ([id number]) [id number] Page 4 of 10 Abuse / Safety / Falls / Self Care Management Nursing Diagnoses: History of Falls Potential for falls Potential for injury related to falls Goals: Patient will not develop complications from immobility Date Initiated: 03/17/2022 Date Inactivated: 04/20/2022 Target Resolution Date: 03/16/2022 Goal Status: Met Patient/caregiver will verbalize understanding of skin care regimen Date Initiated: 03/17/2022 Target Resolution Date: 03/16/2022 Goal Status: Active Patient/caregiver will verbalize/demonstrate measure taken to improve self care Date Initiated: 03/17/2022 Target Resolution Date: 03/16/2022 Goal Status: Active Interventions: Assess fall risk on admission and as needed Provide education on basic hygiene Provide education on personal and home safety Notes: Necrotic Tissue Nursing Diagnoses: Impaired tissue integrity related to necrotic/devitalized tissue Knowledge deficit related to management of necrotic/devitalized tissue Goals: Necrotic/devitalized tissue will be minimized in the wound bed Date Initiated: 03/17/2022 Date Inactivated: 05/04/2022 Target Resolution Date: 03/16/2022 Goal Status: Met Patient/caregiver will verbalize understanding of reason and process for debridement of necrotic tissue Date Initiated: 03/17/2022 Target Resolution Date: 03/16/2022 Goal Status:  Active Interventions: Provide education on necrotic tissue and debridement process Treatment Activities: Apply topical anesthetic as ordered : 03/16/2022 Excisional debridement : 03/16/2022 Notes: Osteomyelitis Nursing Diagnoses: Infection: osteomyelitis Knowledge deficit related to disease process and management Goals: Patient/caregiver will verbalize understanding of disease process and disease management Date Initiated: 03/17/2022 Date Inactivated: 04/20/2022 Target Resolution Date: 03/16/2022 Goal Status: Met Patient's osteomyelitis will resolve Date Initiated: 03/17/2022 Target Resolution Date: 03/16/2022 Goal Status: Active Signs and symptoms for osteomyelitis will be recognized and promptly addressed Date Initiated: 03/17/2022 Target Resolution Date: 03/16/2022 Goal Status: Active Interventions: Assess for signs and symptoms of osteomyelitis resolution every visit Provide education on osteomyelitis Treatment Activities: Systemic antibiotics : 03/16/2022 Notes: Wound/Skin Impairment CHANTEL, TETI Heather ([id number]) [id number] Page 5 of 10 Nursing Diagnoses: Impaired tissue integrity Knowledge deficit related to smoking impact on wound healing Knowledge deficit related to ulceration/compromised skin integrity Goals: Patient/caregiver will verbalize understanding of skin care regimen Date Initiated: 03/17/2022 Date Inactivated: 04/20/2022 Target Resolution Date: 03/16/2022 Goal Status: Met Ulcer/skin breakdown will have Heather volume reduction of 30% by week 4 Date Initiated: 03/17/2022 Target Resolution Date: 04/13/2022 Goal Status: Active Ulcer/skin breakdown will have Heather  volume reduction of 50% by week 8 Date Initiated: 03/17/2022 Target Resolution Date: 05/04/2022 Goal Status: Active Ulcer/skin breakdown will have Heather volume reduction of 80% by week 12 Date Initiated: 03/17/2022 Target Resolution Date: 06/01/2022 Goal Status: Active Ulcer/skin breakdown will  heal within 14 [name] Date Initiated: 03/17/2022 Target Resolution Date: 06/15/2022 Goal Status: Active Interventions: Assess ulceration(s) every visit Provide education on ulcer and skin care Treatment Activities: Skin care regimen initiated : 03/16/2022 Notes: Electronic Signature(s) Signed: 09/14/2022 5:38:08 PM By: Heather Heather, BSN, RN, CWS, [name] RN, BSN Entered By: Heather Heather, BSN, RN, CWS, [name] on 09/14/2022 10:39:35 -------------------------------------------------------------------------------- Pain Assessment Details Patient Name: Date of Service: Heather Hitch Heather. 09/14/2022 10:30 Heather Heather Medical Record Number: [id number] Patient Account Number: [id number] Date of Birth/Sex: Treating RN: [birth year] (46 y.o. Heather Heather Primary Care Candon Caras: Heather Heather Other Clinician: Massie [name] Referring Marlyce Mcdougald: Treating Ramiah Helfrich/Extender: Heather Heather in Treatment: 26 Active Problems Location of Pain Severity and Description of Pain Patient Has Paino Yes Site Locations Rate the pain. ANALILIA, GEDDIS Heather ([id number]) [id number] Page 6 of 10 Rate the pain. Current Pain Level: 6 Pain Management and Medication Current Pain Management: Electronic Signature(s) Signed: 09/14/2022 5:38:08 PM By: Heather Heather, BSN, RN, CWS, [name] RN, BSN Entered By: Heather Heather, BSN, RN, CWS, [name] on 09/14/2022 10:28:57 -------------------------------------------------------------------------------- Patient/Caregiver Education Details Patient Name: Date of Service: Heather Heather 11/22/2023andnbsp10:30 Heather Heather Medical Record Number: [id number] Patient Account Number: [id number] Date of Birth/Gender: Treating RN: [birth year] (46 y.o. Heather Heather Primary Care Physician: Heather Heather Other Clinician: Massie [name] Referring Physician: Treating Physician/Extender: Heather Heather in Treatment: 26 Education Assessment Education Provided  To: Patient Education Topics Provided Offloading: Handouts: What is Offloadingo, Other: TCC applied to left Methods: Explain/Verbal Responses: State content correctly Wound Debridement: Handouts: Wound Debridement Methods: Demonstration, Explain/Verbal Responses: State content correctly Electronic Signature(s) Signed: 09/14/2022 5:38:08 PM By: Heather Heather, BSN, RN, CWS, [name] RN, BSN Entered By: Heather Heather, BSN, RN, CWS, [name] on 09/14/2022 11:54:06 Heather Heather ([id number]) [id number] Page 7 of 10 -------------------------------------------------------------------------------- Wound Assessment Details Patient Name: Date of Service: Heather Heather, Heather Heather 09/14/2022 10:30 Heather Heather Medical Record Number: [id number] Patient Account Number: [id number] Date of Birth/Sex: Treating RN: 21-Jul-1976 (45 y.o. Heather Heather, [name] Primary Care Savino Whisenant: Heather Heather Other Clinician: Massie [name] Referring Jaeli Grubb: Treating Khandi Kernes/Extender: Heather [name] in Treatment: 26 Wound Status Wound Number: 11 Primary Open Surgical Wound Etiology: Wound Location: Right, Plantar Foot Wound Open Wounding Event: Surgical Injury Status: Date Acquired: 12/01/2021 Comorbid Chronic sinus problems/congestion, Middle ear problems, Anemia, [name] Of Treatment: 26 History: Chronic Obstructive Pulmonary Disease (COPD), Congestive Heart Clustered Wound: No Failure, Type II Diabetes, End Stage Renal Disease, History of pressure wounds, Neuropathy Photos Wound Measurements Length: (cm) 0.6 Width: (cm) 0.7 Depth: (cm) 0.6 Area: (cm) 0.33 Volume: (cm) 0.198 % Reduction in Area: 82.1% % Reduction in Volume: 88.1% Epithelialization: Medium (34-66%) Undermining: Yes Starting Position (o'clock): 6 Ending Position (o'clock): 4 Maximum Distance: (cm) 0.7 Wound Description Classification: Full Thickness Without Exposed Suppo Wound Margin: Distinct, outline attached Exudate  Amount: Medium Exudate Type: Serosanguineous Exudate Color: red, brown rt Structures Foul Odor After Cleansing: No Slough/Fibrino Yes Wound Bed Granulation Amount: Medium (34-66%) Exposed Structure Granulation Quality: Pink, Pale Fascia Exposed: No Necrotic Amount: Medium (34-66%) Fat Layer (Subcutaneous Tissue) Exposed: Yes Tendon Exposed: No Muscle Exposed: No Joint Exposed: No Bone Exposed: No Treatment Notes Wound #11 (Foot) Wound Laterality: Plantar, Right Heather Heather, Heather Heather ([id number]) [id number] Page  8 of 10 Cleanser Dakin 16 (oz) 0.25 Discharge Instruction: Use as directed. Soap and Water Discharge Instruction: Gently cleanse wound with antibacterial soap, rinse and pat dry prior to dressing wounds Peri-Wound Care Topical keystone gel Primary Dressing Aquacel Extra Hydrofiber Dressing, 2x2 (in/in) Secondary Dressing ABD Pad 5x9 (in/in) Discharge Instruction: Cover with ABD pad Secured With Medipore T - 35M Medipore H Soft Cloth Surgical T ape ape, 2x2 (in/yd) Kerlix Roll Sterile or Non-Sterile 6-ply 4.5x4 (yd/yd) Discharge Instruction: Apply Kerlix as directed Compression Wrap Compression Stockings Add-Ons Electronic Signature(s) Signed: 09/14/2022 5:38:08 PM By: Heather Heather, BSN, RN, CWS, [name] RN, BSN Entered By: Heather Heather, BSN, RN, CWS, [name] on 09/14/2022 10:36:23 -------------------------------------------------------------------------------- Wound Assessment Details Patient Name: Date of Service: Heather Hitch Heather. 09/14/2022 10:30 Heather Heather Medical Record Number: [id number] Patient Account Number: [id number] Date of Birth/Sex: Treating RN: 10/05/1976 (46 y.o. Heather Heather, [name] Primary Care Emery Binz: Heather Heather Other Clinician: Massie [name] Referring Cathlin Buchan: Treating Riana Tessmer/Extender: Heather [name] in Treatment: 26 Wound Status Wound Number: 12 Primary Diabetic Wound/Ulcer of the Lower Extremity Etiology: Wound  Location: Left, Medial, Plantar Foot Wound Open Wounding Event: Pressure Injury Status: Date Acquired: 03/16/2020 Comorbid Chronic sinus problems/congestion, Middle ear problems, Anemia, [name] Of Treatment: 26 History: Chronic Obstructive Pulmonary Disease (COPD), Congestive Heart Clustered Wound: No Failure, Type II Diabetes, End Stage Renal Disease, History of pressure wounds, Neuropathy Photos Heather Heather, Heather Heather ([id number]) [id number] Page 9 of 10 Wound Measurements Length: (cm) 2 Width: (cm) 1.2 Depth: (cm) 0.4 Area: (cm) 1.885 Volume: (cm) 0.754 % Reduction in Area: 60% % Reduction in Volume: 20% Epithelialization: Small (1-33%) Tunneling: No Undermining: No Wound Description Classification: Grade 3 Wound Margin: Flat and Intact Exudate Amount: Medium Exudate Type: Serous Exudate Color: amber Foul Odor After Cleansing: No Slough/Fibrino No Wound Bed Granulation Amount: Large (67-100%) Exposed Structure Granulation Quality: Red, Pink Fascia Exposed: No Necrotic Amount: None Present (0%) Fat Layer (Subcutaneous Tissue) Exposed: Yes Tendon Exposed: No Muscle Exposed: No Joint Exposed: No Bone Exposed: No Treatment Notes Wound #12 (Foot) Wound Laterality: Plantar, Left, Medial Cleanser Dakin 16 (oz) 0.25 Discharge Instruction: Use as directed. Peri-Wound Care Topical Vitamin AandD Ointment, 4(oz) tube Discharge Instruction: apply to red, broken down area Primary Dressing Gauze Discharge Instruction: moisten with Dakins solution Secondary Dressing ABD Pad 5x9 (in/in) Discharge Instruction: Cover with ABD pad Secured With [place] H Soft Cloth Surgical T ape ape, 2x2 (in/yd) Kerlix Roll Sterile or Non-Sterile 6-ply 4.5x4 (yd/yd) Discharge Instruction: Apply Kerlix as directed Compression Wrap Compression Stockings Add-Ons Electronic Signature(s) Signed: 09/14/2022 5:38:08 PM By: Heather Heather, BSN, RN, CWS, [name] RN,  BSN Entered By: Heather Heather, BSN, RN, CWS, [name] on 09/14/2022 10:39:11 Heather Heather ([id number]) [id number] Page 10 of 10 -------------------------------------------------------------------------------- Vitals Details Patient Name: Date of Service: Heather Heather, Heather Heather 09/14/2022 10:30 Heather Heather Medical Record Number: [id number] Patient Account Number: [id number] Date of Birth/Sex: Treating RN: January 04, 1976 (46 y.o. Heather Heather, [name] Primary Care Kolyn Rozario: Heather Heather Other Clinician: Massie [name] Referring Ulysses Alper: Treating Berklie Dethlefs/Extender: Heather Heather in Treatment: 26 Vital Signs Time Taken: 10:28 Temperature (F): 97.4 Height (in): 69 Pulse (bpm): 99 Weight (lbs): 178 Respiratory Rate (breaths/min): 16 Body Mass Index (BMI): 26.3 Blood Pressure (mmHg): 141/75 Reference Range: 80 - 120 mg / dl Electronic Signature(s) Signed: 09/14/2022 5:38:08 PM By: Heather Heather, BSN, RN, CWS, [name] RN, BSN Entered By: Heather Heather, BSN, RN, CWS, [name] on 09/14/2022 10:28:40

## 2022-09-21 ENCOUNTER — Encounter (HOSPITAL_BASED_OUTPATIENT_CLINIC_OR_DEPARTMENT_OTHER): Payer: Medicaid Other | Admitting: Internal Medicine

## 2022-09-21 DIAGNOSIS — L97528 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ity: Secondary | ICD-10-CM | POA: Diagnosis not present

## 2022-09-21 DIAGNOSIS — E11621 Type 2 diabetes mellitus with foot ulcer: Secondary | ICD-10-CM

## 2022-09-21 DIAGNOSIS — L97512 Non-pressure chronic ulcer of other part of right foot with fat layer exposed: Secondary | ICD-10-CM

## 2022-09-22 NOTE — Progress Notes (Signed)
MERCEDEZ, BOULE [name] ([id number]) [id number] Page 1 of 20 Visit Report for 09/21/2022 Chief Complaint Document Details Patient Name: Date of Service: [name], [name] 09/21/2022 10:30 [name]: [id number] Patient Account Number: [id number] Date of Birth/Sex: Treating RN: 08/25/1976 (46 y.o. Orvan Falconer Primary Care Provider: Raelene Bott Other Clinician: Massie Kluver Referring Provider: Treating Provider/Extender: Eddie North in Treatment: 27 Information Obtained from: Patient Chief Complaint 03/19/2021; patient referred by Dr. Luana Shu who has been looking after her left foot for quite [name] period of time for review of [name] nonhealing area in the left midfoot 03/12/2022; bilateral feet wounds and right lower extremity wound. Electronic Signature(s) Signed: 09/21/2022 12:17:49 PM By: Kalman Shan DO Entered By: Kalman Shan on 09/21/2022 11:28:50 -------------------------------------------------------------------------------- Cellular or Tissue Based Product Details Patient Name: Date of Service: Heather Hitch [name]. 09/21/2022 10:30 [name]: [id number] Patient Account Number: [id number] Date of Birth/Sex: Treating RN: [birth year] (46 y.o. Orvan Falconer Primary Care Provider: Raelene Bott Other Clinician: Massie Kluver Referring Provider: Treating Provider/Extender: Eddie North in Treatment: 27 Cellular or Tissue Based Product Type Wound #12 Left,Medial,Plantar Foot Applied to: Performed By: Physician Kalman Shan, MD Cellular or Tissue Based Product Type: Level of Consciousness (Pre-procedure): Awake and Alert Pre-procedure Verification/Time Out Yes - 11:14 Taken: Location: genitalia / hands / feet / multiple digits Wound Size (sq cm): 3.6 Product Size (sq cm): 44 Waste Size (sq cm): 22 Waste Reason: wound size Amount of Product Applied (sq cm):  22 Instrument Used: Blade, Forceps, Scissors Lot #: [id number] Order #: 2 Expiration Date: 09/29/2022 [name] ([id number]) [id number] Page 2 of 20 Fenestrated: Yes Instrument: Blade Reconstituted: Yes Solution Type: normal saline Solution Amount: 63ml Lot #: [id number] Solution Expiration Date: 02/20/2024 Secured: Yes Secured With: Steri-Strips Dressing Applied: Yes Primary Dressing: Aquacel,bolster Response to Treatment: Procedure was tolerated well Level of Consciousness (Post- Awake and Alert procedure): Post Procedure Diagnosis Same as Pre-procedure Electronic Signature(s) Signed: 09/22/2022 4:33:44 PM By: Massie Kluver Entered By: Massie Kluver on 09/21/2022 11:20:00 -------------------------------------------------------------------------------- Debridement Details Patient Name: Date of Service: Heather Hitch [name]. 09/21/2022 10:30 [name]: [id number] Patient Account Number: [id number] Date of Birth/Sex: Treating RN: Aug 09, 1976 (46 y.o. [name], [place] Primary Care Provider: Raelene Bott Other Clinician: Massie Kluver Referring Provider: Treating Provider/Extender: Eddie North in Treatment: 27 Debridement Performed for Assessment: Wound #12 Left,Medial,Plantar Foot Performed By: Physician Kalman Shan, MD Debridement Type: Debridement Severity of Tissue Pre Debridement: Fat layer exposed Level of Consciousness (Pre-procedure): Awake and Alert Pre-procedure Verification/Time Out Yes - 11:04 Taken: Start Time: 11:04 T Area Debrided (L x W): otal 3 (cm) x 2 (cm) = 6 (cm) Tissue and other material debrided: Viable, Non-Viable, Callus, Slough, Subcutaneous, Slough Level: Skin/Subcutaneous Tissue Debridement Description: Excisional Instrument: Curette Bleeding: Minimum Hemostasis Achieved: Pressure Response to Treatment: Procedure was tolerated well Level of Consciousness (Post-  Awake and Alert procedure): Post Debridement Measurements of Total Wound Length: (cm) 2.4 Width: (cm) 1.5 Depth: (cm) 0.3 Volume: (cm) 0.848 Character of Wound/Ulcer Post Debridement: Stable Severity of Tissue Post Debridement: Fat layer exposed Post Procedure Diagnosis Same as Pre-procedure [name], [name] ([id number]) [id number] Page 3 of 20 Electronic Signature(s) Signed: 09/21/2022 12:17:49 PM By: Kalman Shan DO Signed: 09/22/2022 4:33:44 PM By: Massie Kluver Signed: 09/22/2022 4:40:57 PM By: Carlene Coria RN Entered By: Massie Kluver on 09/21/2022 11:07:00 -------------------------------------------------------------------------------- Debridement Details Patient Name: Date of Service: [name], Heather  [name]. 09/21/2022 10:30 [name]: [id number] Patient Account Number: [id number] Date of Birth/Sex: Treating RN: 1976/09/02 (46 y.o. Orvan Falconer Primary Care Provider: Raelene Bott Other Clinician: Massie Kluver Referring Provider: Treating Provider/Extender: Eddie North in Treatment: 27 Debridement Performed for Assessment: Wound #11 Right,Plantar Foot Performed By: Physician Kalman Shan, MD Debridement Type: Debridement Level of Consciousness (Pre-procedure): Awake and Alert Pre-procedure Verification/Time Out Yes - 11:08 Taken: Start Time: 11:08 T Area Debrided (L x W): otal 0.8 (cm) x 1 (cm) = 0.8 (cm) Tissue and other material debrided: Viable, Non-Viable, Callus, Slough, Subcutaneous, Slough Level: Skin/Subcutaneous Tissue Debridement Description: Excisional Instrument: Curette Bleeding: Minimum Hemostasis Achieved: Pressure Response to Treatment: Procedure was tolerated well Level of Consciousness (Post- Awake and Alert procedure): Post Debridement Measurements of Total Wound Length: (cm) 0.5 Width: (cm) 0.7 Depth: (cm) 0.7 Volume: (cm) 0.192 Character of Wound/Ulcer  Post Debridement: Stable Post Procedure Diagnosis Same as Pre-procedure Electronic Signature(s) Signed: 09/21/2022 12:17:49 PM By: Kalman Shan DO Signed: 09/22/2022 4:33:44 PM By: Massie Kluver Signed: 09/22/2022 4:40:57 PM By: Carlene Coria RN Entered By: Massie Kluver on 09/21/2022 11:13:32 [name] ([id number]) [id number] Page 4 of 20 -------------------------------------------------------------------------------- HPI Details Patient Name: Date of Service: [name], [name] 09/21/2022 10:30 [name]: [id number] Patient Account Number: [id number] Date of Birth/Sex: Treating RN: 1976-06-29 (46 y.o. Orvan Falconer Primary Care Provider: Raelene Bott Other Clinician: Massie Kluver Referring Provider: Treating Provider/Extender: Eddie North in Treatment: 27 History of Present Illness HPI Description: 01/18/18-She is here for initial evaluation of the left great toe ulcer. She is [name] poor historian in regards to timeframe in detail. She states approximately 4 weeks ago she lacerated her toe on something in the house. She followed up with her primary care who placed her on Bactrim and ultimately [name] second dose of Bactrim prior to coming to wound clinic. She states she has been treating the toe with peroxide, Betadine and [name] Band-Aid. She did not check her blood sugar this morning but checked it yesterday morning it was 327; she is unaware of [name] recent A1c and there are no current records. She saw Dr. she would've orthopedics last week for an old injury to the left ankle, she states he did not see her toe, nor did she bring it to his attention. She smokes approximately 1 pack cigarettes [name] day. Her social situation is concerning, she arrives this morning with her mother who appears extremely intoxicated/under the influence; her mother was asked to leave the room and be monitored by the patient's grandmother. The  patient's aunt then accompanied the patient and the room throughout the rest of the appointment. We had [name] lengthy discussion regarding the deleterious effects of uncontrolled hyperglycemia and smoking as it relates to wound healing and overall health. She was strongly encouraged to decrease her smoking and get her diabetes under better control. She states she is currently on [name] diet and has cut down her [facility] consumption. The left toe is erythematous, macerated and slightly edematous with malodor present. The edema in her left foot is below her baseline, there is no erythema streaking. We will treat her with Santyl, doxycycline; we have ordered and xray, culture and provided [name] Peg assist surgical shoe and cultured the wound. 01/25/18-She is here in follow-up evaluation for [name] left great toe ulcer and presents with an abscess to her suprapubic area. She states her blood sugars remain elevated, feeling "sick" and  if levels are below 250, but she is trying. She has made no attempt to decrease her smoking stating that we "can't take away her food in her cigarettes". She has been compliant with offloading using the PEG assist you. She is using Santyl daily. the culture obtained last week grew staph aureus and Enterococcus faecalis; continues on the doxycycline and Augmentin was added on Monday. The suprapubic area has erythema, no femoral variation, purple discoloration, minimal induration, was accessed with [name] cotton tip applicator with sanguinopurulent drainage, this was cultured, I suspect the current antibiotic treatment will cover and we will not add anything to her current treatment plan. She was advised to go to urgent care or ER with any change in redness, induration or fever. 02/01/18-She is here in follow-up evaluation for left great toe ulcers and [name] new abdominal abscess from last week. She was able to use packing until earlier this week, where she "forgot it was there". She states she was  feeling ill with GI symptoms last week and was not taking her antibiotic. She states her glucose levels have been predominantly less than 200, with occasional levels between 200-250. She thinks this was contributing to her GI symptoms as they have resolved without intervention. There continues to be significant laceration to left toe, otherwise it clinically looks stable/improved. There is now less superficial opening to the lateral aspect of the great toe that was residual blister. We will transition to [facility] to all wounds, she will continue her Augmentin. If there is no change or deterioration next week for reculture. 02/08/18-She is here in follow-up evaluation for left great toe ulcer and abdominal ulcer. There is an improvement in both wounds. She has been wrapping her left toe with coban, not by our direction, which has created an area of discoloration to the medial aspect; she has been advised to NOT use coban secondary to her neuropathy. She states her glucose levels have been high over this last week ranging from 200-350, she continues to smoke. She admits to being less compliant with her offloading shoe. We will continue with same treatment plan and she will follow-up next week. 02/15/18-She is here in follow-up evaluation for left great toe ulcer and abdominal ulcer. The abdominal ulcer is epithelialized. The left great toe ulcer is improved and all injury from last week using the Coban wrap is resolved, the lateral ulcer is healed. She admits to noncompliance with wearing offloading shoe and admits to glucose levels being greater than 300 most of the week. She continues to smoke and expresses no desire to quit. There is one area medially that probes deeper than it has historically, erythema to the toe and dorsal foot has consistently waxed and waned. There is no overt signs of cellulitis or infection but we will culture the wound for any occult infection given the new area of depth  and erythema. We will hold off on sensitivities for initiation of antibiotic therapy. 02/22/18-She is here in follow up evaluation for left great toe ulcer. There is overall significant improvement in both wound appearance, erythema and edema with changes made last week. She was not initiated on antibiotic therapy. Culture obtained last week showed oxacillin sensitive staph aureus, sensitive to clindamycin. Clindamycin has been called into the pharmacy but she has been instructed to hold off on initiation secondary to overall clinical improvement and her history of antibiotic intolerance. She has been instructed to contact the clinic with any noted changes/deterioration and the wound, erythema, edema and/or pain.  She will follow-up next week. She continues to smoke and her glucose levels remain elevated >250; she admits to compliance with offloading shoe 03/01/18 on evaluation today patient appears to be doing fairly well in regard to her left first toe ulcer. She has been tolerating the dressing changes with the [facility] Dressing without complication and overall this has definitely showed signs of improvement according to records as well is what the patient tells me today. I'm very pleased in that regard. She is having no pain today 03/08/18 She is here for follow up evaluation of [name] left great toe ulcer. She remains non-compliant with glucose control and smoking cessation; glucose levels consistently >200. She states that she got new shoe inserts/peg assist. She admits to compliance with offloading. Since my last evaluation there is significant improvement. We will switch to prisma at this time and she will follow up next week. She is noted to be tachycardic at this appointment, heart rate 120s; she has [name] history of heart rate 70-130 according to our records. She admits to extreme agitation r/t personal issues; she was advised to monitor her heartrate and contact her physician if it does not return  to [name] more normal range (<100). She takes cardizem twice daily. 03/15/18-She is here in follow-up evaluation for left great toe ulcer. She remains noncompliant with glucose control and smoking cessation. She admits to compliance with wearing offloading shoe. The ulcer is improved/stable and we will continue with the same treatment plan and she will follow-up next week 03/22/18-She is here for evaluation for left great toe ulcer. There continues to be significant improvement despite recurrent hyperglycemia (over 500 yesterday) and she continues to smoke. She has been compliant with offloading and we will continue with same treatment plan and she will follow-up next week. 03/29/18-She is here for evaluation for left great toe ulcer. Despite continuing to smoke and uncontrolled diabetes she continues to improve. She is compliant with offloading shoe. We will continue with the same treatment plan and she will follow-up next week 04/05/18- She is here in follow up evaluation for [name] left great toe ulcer; she presents with small pustule to left fifth toe (resembles ant bite). She admits to compliance with wearing offloading shoe; continues to smoke or have uncontrolled blood glucose control. There is more callus than usual with evidence of bleeding; she denies known trauma. 04/12/18-She is here for evaluation of left great toe ulcer. Despite noncompliance with glycemic control and smoking she continues to make improvement. She continues to wear offloading shoe. The pustule, that was identified last week, to the left fifth toe is resolved. She will follow-up in 2 weeks 05/03/18-she is seen in follow-up evaluation for [name] left great toe ulcer. She is compliant with offloading, otherwise noncompliant with glycemic control and smoking. She has plateaued and there is minimal improvement noted. We will transition to [facility], replaced the insert to her surgical shoe and she will follow-up in one week 05/10/18- She is  here in follow up evaluation for [name] left great toe ulcer. It appears stable despite measurement change. We will continue with same treatment plan and follow up next week. 05/24/18-She is seen in follow-up evaluation for [name] left great toe ulcer. She remains compliant with offloading, has made significant improvement in her diet, decreasing the amount of sugar/soda. She said her recent A1c was 10.9 which is lower than. She did see [name] diabetic nutritionist/educator yesterday. She continues to smoke. We will continue with the same treatment plan and  she'll follow-up next week. 05/31/18- She is seen in follow-up evaluation for left great toe ulcer. She continues to remain compliant with offloading, continues to make improvement in her diet, increasing her water and decreasing the amount of sugar/soda. She does continue to smoke with no desire to quit. We will apply Prisma to the depth and Hydrofera Blue over. We have not received insurance authorization for oasis. She will follow up next week. 06/07/18-She is seen in follow-up evaluation for left great toe ulcer. It has stalled according to today's measurements although base appears stable. She says she saw [name] diabetic educator yesterday; her average blood sugars are less than 300 which is an improvement for her. She continues to smoke and states "that's my next step" She continues with water over soda. We will order for xray, culture and reinstate ace wrap compression prior to placing apligraf for next week. She is voicing no complaints or concerns. Her dressing will change to iodoflex over the next week in preparation for apligraf. 06/14/18-She is seen in follow-up evaluation for left great toe ulcer. Plain film x-ray performed last week was negative for osteomyelitis. Wound culture obtained last week grew strep B and OSSA; she is initiated on keflex and cefdinir today; there is erythema to the toe which could be from ace wrap compression, she Streeter, Neyla [name]  ([id number]) [id number] Page 5 of 20 has [name] history of wrapping too tight and has has been encouraged to maintain ace wraps that we place today. We will hold off on application of apligraf today, will apply next week after antibiotic therapy has been initiated. She admits today that she has resumed taking [name] shower with her foot/toe submerged in water, she has been reminded to keep foot/toe out of the bath water. She will be seen in follow up next week 06/21/18-she is seen in follow-up evaluation for left great toe ulcer. She is tolerating antibiotic therapy with no GI disturbance. The wound is stable. Apligraf was applied today. She has been decreasing her smoking, only had 4 cigarettes yesterday and 1 today. She continues being more compliant in diabetic diet. She will follow-up next week for evaluation of site, if stable will remove at 2 weeks. 06/28/18- She is here in follow up evalution. Apligraf was placed last week, she states the dressing fell off on Tuesday and she was dressing with hydrofera blue. She is healed and will be discharged from the clinic today. She has been instructed to continue with smoking cessation, continue monitoring glucose levels, offloading for an additional 4 weeks and continue with hydrofera blue for additional two weeks for any possible microscopic opening. Readmission: 08/07/18 on evaluation today patient presents for reevaluation concerning the ulcer of her right great toe. She was previously discharged on 06/28/18 healed. Nonetheless she states that this began to show signs of drainage she subsequently went to her primary care provider. Subsequently an x-ray was performed on 08/01/18 which was negative. The patient was also placed on antibiotics at that time. Fortunately they should have been effective for the infection. Nonetheless she's been experiencing some improvement but still has [name] lot of drainage coming from the wound itself. 08/14/18 on  evaluation today patient's wound actually does show signs of improvement in regard to the erythema at this point. She has completed the antibiotics. With that being said we did discuss the possibility of placing her in [name] total contact cast as of today although I think that I may want to give this just [name] little bit  more time to ensure nothing recurrence as far as her infection is concerned. I do not want to put in the cast and risk infection at that time if things are not completely resolved. With that being said she is gonna require some debridement today. 08/21/18 on evaluation today patient actually appears to be doing okay in regard to her toe ulcer. She's been tolerating the dressing changes without complication. With that being said it does appear that she is ready and in fact I think it's appropriate for [place] to go ahead and initiate the total contact cast today. Nonetheless she will require some sharp debridement to prepare the wound for application. Overall I feel like things have been progressing well but we do need to do something to get this to close more readily. 08/24/18 patient seen today for reevaluation after having had the total contact cast applied on Tuesday. She seems to have done very well the wound appears to be doing great and overall I'm pleased with the progress that she's made. There were no abnormal areas of rubbing from the cast on her lower extremity. 08/30/18 on evaluation today patient actually appears to be completely healed in regard to her plantar toe ulcer. She tells me at this point she's been having [name] lot of issues with the cast. She almost fell [name] couple of times the state shall the step of her dog [name] couple times as well. This is been [name] very frustrating process for her other nonetheless she has completely healed the wound which is excellent news. Overall there does not appear to be the evidence of infection at this time which is great news. 09/11/18 evaluation today  patient presents for follow-up concerning her great toe ulcer on the left which has unfortunately reopened since I last saw her which was only [name] couple of weeks ago. Unfortunately she was not able to get in to get the shoe and potentially the AFO that's gonna be necessary due to her left foot drop. She continues with offloading shoe but this is not enough to prevent her from reopening it appears. When we last had her in the total contact cast she did well from [name] healing standpoint but unfortunately the wound reopened as soon as she came out of the cast within just [name] couple of weeks. 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 This is also part of the reason why she falls on [name] regular basis. I just do not believe that is gonna be safe for [place] to reinitiate the total contact cast as last time we had this on she fell 3 times one week which is definitely not normal for her. 09/18/18 upon evaluation today the patient actually appears to be doing about the same in regard to her toe ulcer. She did not contact [employer] as I asked her to even though I had given her the prescription. In fact she actually states that she has no idea where the prescription is. She did apparently call [employer] and they told her that all she needed to do was bring the prescription in order to be able to be seen and work on getting the AFO for her left foot. With all that being said she still does not have an appointment and I'm not sure were  things stand that regard. I will give her [name] new prescription today in order to contact them to get this set up. 09/25/18 on evaluation today patient actually appears to be doing about the same in regard to her toes ulcer. She does have [name] small areas which seems to have [name] lot of callous buildup around the  edge of the wound which is going to need sharp debridement today. She still is waiting to be scheduled for evaluation with [employer] for possibility of an AFO. She states there supposed to call her tomorrow to get this set up. Unfortunately it does appear that her foot specifically the toe area is showing signs of erythema. There does not appear to be any systemic infection which is in these good news. 10/02/18 on evaluation today patient actually appears to be doing about the same in regard to her toe ulcer. This really has not done too well although it's not significantly larger it's also not significantly smaller. She has been tolerating the dressing changes without complication. She actually has her appointment with [facility] and [place] tomorrow to hopefully be measured for obtaining and AFO splint. I think this would be helpful preventing this from reoccurring. We had contemplated starting the cast this week although to be honest I am reluctant to do that as she's been having nausea, vomiting, and seizure activity over the past three days. She has [name] history of seizures and have been told is nothing that can be done for these. With that being said I do believe that along with the seizures have the nausea vomiting which upon further questioning doesn't seem to be the normal for her and makes me concerned for the possibility of infection or something else going on. I discussed this with the patient and her mother during the office visit today. I do not feel the wound is effective but maybe something else. The responses this was "this just happens to her at times and we don't know why". They did not seem to be interested in going to the hospital to have this checked out further. 10/09/18 on evaluation today patient presents for follow-up concerning her ongoing toe ulcer. She has been tolerating the dressing changes without complication. Fortunately there does not appear to be any evidence of infection  which is great news however I do think that the patient would benefit from going ahead for with the total contact cast. She's actually in [name] wheelchair today she tells me that she will use her walker if we initiate the cast. I was very specific about the fact that if we were gonna do the cast I wanted to make sure that she was using the walker in order to prevent any falls. She tells me she does not have stairs that she has to traverse on [name] regular basis at her home. She has not had any seizures since last week again that something that happens to her often she tells me she did talk to [employer] and they said that it may take up to three weeks to get the brace approved for her. Hopefully that will not take that long but nonetheless in the meantime I do think the cast could be of benefit. 10/12/18 on evaluation today patient appears to be doing rather well in regard to her toe ulcer. It's just been [name] few days and already this is significantly improved both as far as overall appearance and size. Fortunately there's no sign of infection. She is here for her first obligatory cast  change. 10/19/18 Seen today for follow up and management of left great toe ulcer. Wound continues to show improvement. Noted small open area with seroussang drainage with palpation. Denies any increased pain or recent fevers during visit. She will continue calcium alginate with offloading shoe. Denies any questions or concerns during visit. 10/26/18 on evaluation today patient appears to be doing about the same as when I last saw her in regard to her wound bed. Fortunately there does not appear to be any signs of infection. Unfortunately she continues to have [name] breakdown in regard to the toe region any time that she is not in the cast. It takes almost no time at all for this to happen. Nonetheless she still has not heard anything from the brace being made by [employer] as to when exactly this will be available to her. Fortunately there is  no signs of infection at this time. 10/30/18 on evaluation today patient presents for application of the total contact cast as we just received him this morning. Fortunately we are gonna be able to apply this to her today which is great news. She continues to have no significant pain which is good news. Overall I do feel like things have been improving while she was the cast is when she doesn't have [name] cast that things get worse. She still has not really heard anything from [facility] regarding her brace. 11/02/18 upon evaluation today patient's wound already appears to be doing significantly better which is good news. Fortunately there does not appear to be any signs of infection also good news. Overall I do think the total contact cast as before is helping to heal this area unfortunately it's just not gonna likely keep the area closed and healed without her getting her brace at least. Again the foot drop is [name] significant issue for her. [name], [name] ([id number]) [id number] Page 6 of 20 11/09/18 on evaluation today patient appears to be doing excellent in regard to her toe ulcer which in fact is completely healed. Fortunately we finally got the situation squared away with the paperwork which was needed to proceed with getting her brace approved by Medicaid. I have filled that out unfortunately that information has been sent to the orthopedic office that I worked at 2 1/2 years ago and not tired Current wound care measures. Fortunately she seems to be doing very well at this time. 11/23/18 on evaluation today patient appears to be doing More Poorly Compared to Last Time I Saw Her. At [facility] She Had Completely Healed. Currently she is continuing to have issues with reopening. She states that she just found out that the brace was approved through Medicaid now she just has to go get measured in order to have this fitted for her and then made. Subsequently she does not have an  appointment for this yet that is going to complicate things we obviously cannot put her back in the cast if we do not have everything measured because they're not gonna be able to measure her foot while she is in the cast. 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 Obviously this is unfortunate and does have me somewhat worried as well. 11/30/18 on evaluation today patient's toe ulcer actually appears to be doing fairly well. The good news is she will be getting her brace in the shoes next week on Wednesday. Hopefully we will be  able to get this to heal without having to go back in the cast however she may need the cast in order to get the wound completely heal and then go from there. Fortunately there's no signs of infection at this time. 12/07/18 on evaluation today patient fortunately did receive her brace and she states she could tell this definitely makes her walk better. With that being said she's been having issues with her toe where she noticed yesterday there was [name] lot of tissue that was loosing off this appears to be much larger than what it was previous. She also states that her leg has been read putting much across the top of her foot just about the ankle although this seems to be receiving somewhat. The total area is still red and appears to be someone infected as best I can tell. She is previously taken Bactrim and that may be [name] good option for her today as well. We are gonna see what I wound culture shows as well and I think that this is definitely appropriate. With that being said outside of the culture I still need to initiate something in the interim and that's what I'm gonna go ahead and select Bactrim is [name] good option for her. 12/14/18 on evaluation today patient appears to be doing better in regard to her left great toe ulcer as compared to last week's evaluation. There's still  some erythema although this is significantly improved which is excellent news. Overall I do believe that she is making good progress is still gonna take some time before she is where I would like her to be from the standpoint of being able to place her back into the total contact cast. Hopefully we will be where we need to be by next week. 12/21/18 on evaluation today patient actually appears to be doing poorly in regard to her toe ulcer. She's been tolerating the dressing changes without complication. Fortunately there's no signs of systemic infection although she does have [name] lot of drainage from the toe ulcer and this does seem to be causing some issues at this point. She does have erythema on the distal portion of her toe that appears to be likely cellulitis. 12/28/18 on evaluation today patient actually appears to be doing [name] little better in my pinion in regard to her toe ulcer. With that being said she still does have some evidence of infection at this time and for her culture she had both E. coli as well as enterococcus as organisms noted on evaluation. For that reason I think that though the Keflex likely has treated the E. coli rather well this has really done nothing for the enterococcus. We are going to have to initiate treatment for this specifically. 01/04/19 on evaluation today patient's toe actually appears to be doing better from the standpoint of infection. She currently would like to see about putting the cash back on I think that this is appropriate as long as she takes care of it and keeps it from getting wet. She is gonna have some drainage we can definitely pass this up with Drawtex and alginate to try to prevent as much drainage as possible from causing the problems. With that being said I do want to at least try her with the cast between now and Tuesday. If there any issues we can't continue to use it then I will discontinue the use of the cast at that point. 01/08/19 on evaluation  today patient actually appears to be doing very  well as far as her foot ulcer specifically the great toe on the left is concerned. She did have an area of rubbing on the medial aspect of her left ankle which again is from the cast. Fortunately there's no signs of infection at this point in this appears to be [name] very slight skin breakdown. The patient tells me she felt it rubbing but didn't think it was that bad. Fortunately there is no signs of active infection at this time which is good news. No fevers, chills, nausea, or vomiting noted at this time. 01/15/19 on evaluation today patient actually appears to be doing well in regard to her toe ulcer. Again as previous she seems to do well and she has the cast on which indicates to me that during the time she doesn't have [name] cast on she's putting way too much pressure on this region. Obviously I think that's gonna be an issue as with the current national emergency concerning the Covid-19 Virus it has been recommended that we discontinue the use of total contact casting by the chief medical officer of our company, Dr. Simona Huh. The reasoning is that if [name] patient becomes sick and cannot come into have the cast removed they could not just leave this on for an additional two weeks. Obviously the hospitals also do not want to receive patient's who are sick into the emergency department to potentially contaminate the region and spread the Covid-19 Virus among other sick individuals within the hospital system. Therefore at this point we are suspending the use of total contact cast until the current emergency subsides. This was all discussed with the patient today as well. 01/22/19 on evaluation today patient's wound on her left great toe appears to be doing slightly worse than previously noted last week. She tells me that she has been on this quite [name] bit in fact she tells me she's been awake for 38 straight hours. This is due to the fact that she's having to care for  grandparents because nobody else will. She has been taking care of them for five the last seven days since I've seen her they both have dementia his is from [name] stroke and her grandmother's was progressive. Nonetheless she states even her mom who knows her condition and situation has only help two of those days to take care of them she's been taking care of the rest. Fortunately there does not appear to be any signs of active infection in regard to her toe at this point although obviously it doesn't look as good as it did previous. I think this is directly related to her not taking off the pressure and friction by way of taking things easy. Though I completely understand what's going on. 01/29/19 on evaluation today patient's tools are actually appears to be showing some signs of improvement today compared to last week's evaluation as far as not necessarily the overall size of the wound but the fact that she has some new skin growth in between the two ends of the wound opening. Overall I feel like she has done well she states that she had [name] family member give her what sounds to be [name] CAM walker boot which has been helpful as well. 02/05/19 on evaluation today patient's wound bed actually appears to be doing significantly better in regard to her overall appearance of the size of the wound. With that being said she is still having an issue with offloading efficiently enough to get this to close. Apparently there is  some signs of infection at this point as well unfortunately. Previously she's done well of Augmentin I really do not see anything that needs to be culture currently but there theme and cellulitis of the foot that I'm seeing I'm gonna go ahead and place her on an antibiotic today to try to help clear this up. 02/12/2019 on evaluation today patient actually appears to be doing poorly in regard to her overall wound status. She tells me she has been using her offloading shoe but actually comes in today  wearing her tennis shoe with the AFO brace. Again as I previously discussed with her this is really not sufficient to allow the area to heal appropriately. Nonetheless she continues to be somewhat noncompliant and I do wonder based on what she has told my nurse in the past as to whether or not [name] good portion of this noncompliance may be recreational drug and alcohol related. She has had [name] history of heroin overdose and this was fairly recently in the past couple of months that have been seeing her. Nonetheless overall I feel like her wound looks significantly worse today compared to what it was previous. She still has significant erythema despite the Augmentin I am not sure that this is an appropriate medication for her infection I am also concerned that the infection may have gone down into her bone. 02/19/19 on evaluation today patient actually appears to be doing about the same in regard to her toe ulcer. Unfortunately she continues to show signs of bone exposure and infection at this point. There does not appear to be any evidence of worsening of the infection but I'm also not really sure that it's getting significantly better. She is on the Augmentin which should be sufficient for the Staphylococcus aureus infection that she has at this point. With that being said she may need IV antibiotics to more appropriately treat this. We did have [name] discussion today about hyperbaric option therapy. 02/28/19 on evaluation today patient actually appears to be doing much worse in regard to the wound on her left great toe as compared to even my previous evaluation last week. Unfortunately this seems to be training in [name] pretty poor direction. Her toe was actually now starting to angle laterally and I can actually see the entire joint area of the proximal portion of the digit where is the distal portion of the digit again is no longer even in contact with the joint line. Unfortunately there's [name] lot more necrotic  tissue around the edge and the toe appears to be showing signs of becoming gangrenous in my pinion. I'm very concerned about were things stand at this point. She did see infectious disease and they are planning to send in [name] prescription for Sivextro for her and apparently this has been approved. With that being said I don't think she should avoid taking this but at the same time I'm not sure that it's gonna be sufficient Griess, Hadlee [name] ([id number]) [id number] Page 7 of 20 to save her toe at this point. She tells me that she still having to care for grandparents which I think is putting quite [name] bit of strain on her foot and specifically the total area and has caused this to break down even to [name] greater degree than would've otherwise been expected. 03/05/19 on evaluation today patient actually appears to be doing quite well in regard to her toe all things considering. She still has bone exposed but there appears to be much less your  thing on overall the appearance of the wound and the toe itself is dramatically improved. She still does have some issues currently obviously with infection she did see vascular as well and there concerned that her blood flow to the toad. For that reason they are setting up for an angiogram next week. 03/14/19 on evaluation today patient appears to be doing very poor in regard to her toe and specifically in regard to the ulceration and the fact that she's starting to notice the toe was leaning even more towards the lateral aspect and the complete joint is visible on the proximal aspect of the joint. Nonetheless she's also noted [name] significant odor and the tip of the toe is turning more dark and necrotic appearing. Overall I think she is getting worse not better as far as this is concerned. For that reason I am recommending at this point that she likely needs to be seen for likely amputation. READMISSION 03/19/2021 This is [name] patient that we cared  for in this clinic for [name] prolonged period of time in 2019 and 2020 with [name] left foot and left first toe wound. 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 Luana Shu. In December 2021 she had an ulcer on her right great toe as well as the fourth and fifth toes. 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 She also had an angiogram at that time and underwent angioplasty of the right anterior tibial artery. In any case she claims that the wound on the right foot is closed I did not look at this today which was probably an oversight although I think that should be done next week. 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 According to Dr. Deborra Medina last review that she was out of the area being cared for by another physician but recently came back to his attention. 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 [name] culture of this area showed E. coli apparently before she came back to see Dr. Luana Shu she was supposed to be receiving antibiotics but she did not really take them. Nor is she offloading this area at all. Finally her last hemoglobin A1c listed in epic was in March 2022 at 14.1 she says things are [name] lot better since then although I am not sure. 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 She had this wound at the time. During this admission she had E. coli sepsis [name] TEE was negative. 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 She is still [name] heavy cigarette smoker. She has  [name] history of bipolar disorder seizure disorder ABI in our clinic was 1.05 6/1; left midfoot in the setting of [name] TMA done previously. Round circular wound with [name] "knuckle" of protruding tissue. The problem is that the knuckle was not attached to any of the surrounding granulation and this probed proximally widely I removed [name] large portion of this tissue. This wound goes with considerable undermining laterally. I do not feel any bone there was no purulence but this is [name] deep  wound. 6/8; in spite of the debridement I did last week. She arrives with [name] wound looking exactly the same. [name] protruding "knuckle" of tissue nonadherent to most of the surrounding tissue. There is considerable depth around this from 6-12 o'clock at 2.7 cm and undermining of 1 cm. This does not look overtly infected and the x- ray I did last week was negative for any osseous abnormalities. We have been using silver collagen 6/15; deep tissue culture I did last week showed moderate staph aureus and moderate Pseudomonas. This will definitely require prolonged antibiotic therapy. The pathology on the protuberant area was negative for malignancy fungus etc. 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 We have been using silver collagen. I am going to be prescribing Levaquin for 2 weeks[address] Her [address] scan of the foot is down for 7/5 6/22; CT scan of the foot on 7 5. She says she has hardware in the left leg from her previous fracture. She is on the Levaquin for the deep tissue culture I did that showed methicillin sensitive staph aureus and Pseudomonas. I gave her [name] 2-week supply and she will have another week. She arrives in clinic today with the same protuberant tissue however this is nonadherent to the tissue surrounding it. I am really at [name] loss to explain this unless there is underlying deep tissue infection 6/29; patient presents for 1 week follow-up. She has been using collagen to  the wound bed. She reports taking her antibiotics as prescribed.She has no complaints or issues today. She denies signs of infection. 7/6; patient presents for one week followup. She has been using collagen to the wound bed. She states she is taking Levaquin however at times she is not able to keep it down. She denies signs of infection. 7/13; patient presents for 1 week follow-up. She has been using silver alginate to the wound bed. She still has nausea when taking her antibiotics. She denies signs of infection. 7/20; patient presents for 1 week follow-up. She has been using silver alginate with gentamicin cream to the wound bed. She denies any issues and has no complaints today. She denies signs of infection. 7/27; patient presents for 1 week follow-up. She continues to use silver alginate with gentamicin cream to the wound bed. She reports starting her antibiotics. She has no issues or complaints. Overall she reports stability to the wound. 8/3; patient presents for 1 week follow-up. She has been using silver alginate with gentamicin cream to the wound bed. She reports completing all antibiotics. She has no issues or complaints today. She denies signs of infection. 8/17; patient presents for 2-week follow-up. He is to use silver alginate to the wound bed. She has no issues or complaints today. She denies signs of infection. She reports her pain has improved in her foot since last clinic visit 8/24; patient presents for 1 week follow-up. She continues to use silver alginate to the wound bed. She has no issues or complaints. She denies signs of infection. Pain is stable. 9/7; patient presents for follow-up. She missed her last week appointment due to feeling ill. She continues to use silver alginate. She has [name] new wound to the right lower extremity that is covered in eschar. She states It occurred over the past week and has no idea how it started. She currently denies signs of infection. 9/14;  patient presents for follow-up. T the left foot wound she has been using gentamicin cream and silver alginate. T  the right lower extremity wound she has o o been keeping this covered and has not obtain Santyl. 9/21; patient presents for follow-up. She reports using gentamicin cream and silver alginate to the left foot and Santyl to the right lower extremity wound. She has no issues or complaints today. She denies signs of infection. 9/28; patient presents for follow-up. She reports [name] new wound to her right heel. She states this occurred [name] few days ago and is progressively gotten worse. She has been trying to clean the area with [name] Q-tip and Santyl. She reports stability in the other 2 wounds. She has been using gentamicin cream and silver alginate to the left foot and Santyl to the right lower extremity wound. 10/12; patient presents for follow-up. She reports improvement to the wound beds. She is seeing vein and vascular to discuss the potential of [name] left BKA. She states they are going to do an arteriogram. She continues to use silver alginate with dressing changes to her wounds. 11/2; patient presents for follow-up. She states she has not been doing dressing changes to the wound beds. She states she is not able to offload the areas. She reports chronic pain to her left foot wound. 11/9; patient presents for follow-up. She came in with only socks on. She states she forgot to put on shoes. It is unclear if she is doing any dressing changes. She currently denies systemic signs of infection. 11/16; patient presents for follow-up. She came again only with socks on. She states she does not wear shoes ever. It is unclear if she does dressing changes. She currently denies systemic signs of infection. 11/23; patient presents for follow-up. She wore her shoes today. It still unclear exactly what dressing she is using for each wound but she did states she obtained Dakin's solution and has been using this to  the left foot wound. She currently denies signs of infection. 11/30; patient presents for follow-up. She has no issues or complaints today. She currently denies signs of infection. [name], [name] ([id number]) [id number] Page 8 of 20 12/7; patient presents for follow-up. She has no issues or complaints today. She has been using Hydrofera Blue to the right heel wound and Dakin solution to the left foot wound. Her right anterior leg wound is healed. She currently denies signs of infection. 12/14; patient presents for follow-up. She has been using Hydrofera Blue to the right heel and Dakin's to the left foot wounds. She has no issues or complaints today. She denies signs of infection. 12/21; patient presents for follow-up. She reports using Hydrofera Blue to the right heel and Dakin's to the left foot wound. She denies signs of infection. 12/28; patient presents for follow-up. She continues to use Dakin's to the left foot wound and Hydrofera Blue to the right heel wound. She denies signs of infection. 1/4; patient presents for follow-up. She has no issues or complaints today. She denies signs of infection. 1/11; patient presents for follow-up. It is unclear if she has been dressing these wounds over the past week. She currently denies signs of infection. 1/18; patient presents for follow-up. She states she has been using Dakin's wet-to-dry dressings to the left foot. She has been using Hydrofera Blue to the right foot foot wound. She states that the anterior right leg wound has reopened and draining serous fluid. She denies signs of infection. 1/25; patient presents for follow-up. She has no issues or complaints today. 2/1; patient presents for follow-up. She has no issues or complaints  today. She denies signs of infection. 2/8; patient presents for follow-up. She has lost her surgical shoes. She did not have [name] dressing to the right heel wound. She currently denies signs  of infection. 2/15; patient presents for follow-up. She reports more pain to the right heel today. She denies purulent drainage Or fever/chills 2/22; patient presents for follow-up. She reports taking clindamycin over the past week. She states that she continues to have pain to her right heel. She reports purulent drainage. Readmission 03/16/2022 Ms. Laresa Oshiro is [name] 46 year old female with [name] past medical history of type 2 diabetes, osteomyelitis to her feet, chronic systolic heart failure and bipolar disorder that presents to the clinic for bilateral feet wounds and right lower extremity wound. She was last seen in our clinic on 12/15/2021. At that time she had purulent drainage coming out of her right plantar foot and I recommended she go to the ED. She states she went to [facility] and has been there for the past 3 months. I cannot see the records. She states she had OR debridement and was on several weeks of IV antibiotics while inpatient. Since discharge she has not been taking care of the wound beds. She had nothing on her feet other than socks today. She currently denies signs of infection. 5/31; patient presents for follow-up. She has been using Dakin's wet-to-dry dressings to the wound beds on her feet bilaterally and antibiotic ointment to the right anterior leg wound. She had [name] wound culture done at last clinic visit that showed moderate Pseudomonas aeruginosa sensitive to ciprofloxacin. She currently denies systemic signs of infection. 6/14; patient presents for follow-up. She received Keystone 5 days ago and has been using this on the wound beds. She states that last week she had to go to the hospital because she had increased warmth and erythema to the right foot. She was started on 2 oral antibiotics. She states she has been taking these. She currently denies systemic signs of infection. She has no issues or complaints today. 6/21; patient presents for follow-up. She  states she has been using Keystone antibiotics to the wound beds. She has no issues or complaints today. She denies signs of infection. 6/28; patient presents for follow-up. She has been using Keystone antibiotics to the wound beds. She has no issues or complaints today. 7/12; patient presents for follow-up. Has been using Keystone antibiotics to the wound beds with calcium alginate. She has no issues or complaints today. She never followed up with her orthopedic surgeon who did the OR debridement to the right foot. We discussed the total contact cast for the left foot and patient would like to do this next week. 7/19; patient presents for follow-up. She has been using Keystone antibiotics with calcium alginate to the wound beds. She has no issues or complaints today. Patient is in agreement to do the total contact cast of the left foot today. She knows to return later this week for the obligatory cast change. 05-13-2022 upon evaluation today patient's wound which she has the cast of the left leg actually appears to be doing significantly better. Fortunately I do not see any signs of active infection locally or systemically which is great news and overall I am extremely pleased with where we stand currently. 7/26; patient presents for follow-up. She has [name] cast in place for the past week. She states it irritated her shin. Other than that she tolerated the cast well. She states she would like [name] break for  1 week from the cast. We have been using Keystone antibiotic and Aquacel to both wound beds. She denies signs of infection. 8/2; patient presents for follow-up. She has been using Keystone and Aquacel to the wound beds. She denies any issues and has no complaints. She is agreeable to have the cast placed today for the left leg. 06-03-2022 upon evaluation today patient appears to be doing well with regard to her wound she saw some signs of improvement which is great news. Fortunately I do not see any  evidence of active infection locally or systemically at this time which is great news. No fevers, chills, nausea, vomiting, or diarrhea. 8/16; patient presents for follow-up. She has no issues or complaints today. We have been using Keystone and Aquacel to the wound beds. The left lower extremity is in [name] total contact cast. She is tolerated this well. 8/23; patient presents for follow-up. She has had the total contact cast on the left leg for the past week. Unfortunately this has rubbed and broken down the skin to the medial foot. She currently denies signs of infection. She has been using Keystone antibiotic to the right plantar foot wound. 8/30; patient presents for follow-up. We have held off on the total contact cast for the left leg for the past week. Her wound on the left foot has improved and the previous surrounding breakdown of skin has epithelialized. She has been using Keystone antibiotic to both wound beds. She has no issues or complaints today. She denies signs of infection. 9/6; patient presents for follow-up. She has ordered her's Keystone antibiotic and this is arriving this week. She has been doing Dakin's wet-to-dry dressings to the wound beds. She denies signs of infection. She is agreeable to the total contact cast today. 9/13; patient presents for follow-up. She states that the cast caused her left leg shin to be sore. She would like to take [name] break from the cast this week. She has been using Keystone antibiotic to the right plantar foot wound. She denies signs of infection. 9/20; patient presents for follow-up. She has been using Keystone antibiotics to the wound beds with calcium alginate to the right foot wound and Hydrofera Blue to the left foot wound. She is agreeable to having the cast placed today. She has been approved for Apligraf and we will order this for next clinic visit. 9/27; patient presents for follow-up. We have been using Keystone antibiotic with Hydrofera Blue  to the left foot wound under [name] total contact cast. T the right SWEET, JARVIS [name] ([id number]) [id number] Page 9 of 20 foot wound she has been using Keystone antibiotic and calcium alginate. She declines [name] total contact cast today. Apligraf is available for placement and she would like to proceed with this. 07-28-2022 upon evaluation today patient appears to be doing well currently in regard to her wound. She is actually showing signs of significant improvement which is great news. Fortunately I do not see any evidence of active infection locally nor systemically at this time. She has been seeing Dr. Heber [place] and to be honest has been doing very well with the cast. Subsequently she comes in today with [name] cast on and we did reapply that today as well. She did not really want to she try to talk me out of that but I explained that if she wanted to heal this is really the right way to go. Patient voiced understanding. In regard to her right foot this is actually [name] lot better  compared to the last time I saw her which is also great news. 10/11; patient presents for follow-up. Apligraf and the total contact cast was placed to the left leg at last clinic visit. She states that her right foot wound had burning pain to it with the placement of Apligraf to this area. She has been doing [place] over this area. She denies signs of infection including increased warmth, erythema or purulent drainage. 11/1; 3-week follow-up. The patient fortunately did not have [name] total contact cast or an Apligraf and on the left foot. She has been using Keystone ABD pads and kerlix and her own running shoes She arrives in clinic today with thick callus and [name] very poor surface on the left foot on the right nonviable skin subcutaneous tissue and [name] deep probing hole. 11/15; patient missed her last clinic appointment. She states she has not been dressing the wound beds for the past 2 weeks. She states that at  she had [name] new roommate but is now going back to live with her mother. Apparently its been [name] distracting 2 weeks. Patient currently denies signs of infection. 11/22; patient presents for follow-up. She states she has been using Keystone antibiotic and Dakin's wet-to-dry dressings to the wound beds. She is agreeable for cast placement today. We had ordered Apligraf however this has not been received by our facility. 11/29; Patient had [name] total contact cast placed at last clinic visit and she tolerated this well. We were using silver alginate under the cast. Patient's been using Keystone antibiotic with Aquacel to the right plantar foot wound. She has no issues or complaints today. Apligraf is available for placement today. Patient would like to proceed with this. Electronic Signature(s) Signed: 09/21/2022 12:17:49 PM By: Kalman Shan DO Entered By: Kalman Shan on 09/21/2022 11:31:29 -------------------------------------------------------------------------------- Physical Exam Details Patient Name: Date of Service: Heather Hitch [name]. 09/21/2022 10:30 [name]: [id number] Patient Account Number: [id number] Date of Birth/Sex: Treating RN: [birth year] (46 y.o. Orvan Falconer Primary Care Provider: Raelene Bott Other Clinician: Massie Kluver Referring Provider: Treating Provider/Extender: Eddie North in Treatment: 76 Constitutional . Cardiovascular . Psychiatric . Notes Right foot: T the plantar heel there is an incision site with increased depth.T the opening there is granulation tissue. Left foot: T the medial aspect there is an o o o open wound with granulation tissue and nonviable tissue with surrounding callus. No overt signs of infection to any of the wound beds. Electronic Signature(s) Signed: 09/21/2022 12:17:49 PM By: Kalman Shan DO Entered By: Kalman Shan on 09/21/2022 11:32:13 [name] ([id number])  [id number] Page 10 of 20 -------------------------------------------------------------------------------- Physician Orders Details Patient Name: Date of Service: IDY, RAWLING 09/21/2022 10:30 [name]: [id number] Patient Account Number: [id number] Date of Birth/Sex: Treating RN: 10-12-1976 (46 y.o. 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 Nurse Visit as needed Bathing/ Shower/ Hygiene May shower with wound  dressing protected with water repellent cover or cast protector. No tub bath. Anesthetic (Use 'Patient Medications' Section for Anesthetic Order Entry) Lidocaine applied to wound bed Edema Control - Lymphedema / Segmental Compressive Device / Other Elevate, Exercise Daily and [name] void Standing for Long Periods of Time. Elevate legs to the level of the heart and pump ankles as often as possible Elevate leg(s) parallel to the floor when sitting. Off-Loading Total Contact Cast to Left Lower Extremity Open toe surgical shoe - Heel off loader - Right foot Other: - keep pressure off of feet. Medications-Please add to medication list. Wound #12 Left,Medial,Plantar Foot Keystone Compound - Keystone gel right foot Wound Treatment Wound #11 - Foot Wound Laterality: Plantar,  Right Cleanser: Dakin 16 (oz) 0.25 1 x Per Day/30 Days Discharge Instructions: Use as directed. Cleanser: Soap and Water 1 x Per Day/30 Days Discharge Instructions: Gently cleanse wound with antibacterial soap, rinse and pat dry prior to dressing wounds Topical: keystone gel 1 x Per Day/30 Days Prim Dressing: Aquacel Extra Hydrofiber Dressing, 2x2 (in/in) ary 1 x Per Day/30 Days Secondary Dressing: ABD Pad 5x9 (in/in) (Generic) 1 x Per Day/30 Days Discharge Instructions: Cover with ABD pad Secured With: Medipore T - 67M Medipore H Soft Cloth Surgical T ape ape, 2x2 (in/yd) (Generic) 1 x Per Day/30 Days Secured With: Kerlix Roll Sterile or Non-Sterile 6-ply 4.5x4 (yd/yd) (Generic) 1 x Per Day/30 Days Discharge Instructions: Apply Kerlix as directed MAHOGANY, TORRANCE [name] ([id number]) [id number] Page 11 of 20 Wound #12 - Foot Wound Laterality: Plantar, Left, Medial Cleanser: Dakin 16 (oz) 0.25 (Dispense As Written) 1 x Per Day/30 Days Discharge Instructions: Use as directed. Topical: Vitamin AandD Ointment, 4(oz) tube 1 x Per Day/30 Days Discharge Instructions: apply to red, broken down area Prim Dressing: Gauze 1 x Per Day/30 Days ary Discharge Instructions: moisten with Dakins solution Secondary Dressing: ABD Pad 5x9 (in/in) (Generic) 1 x Per Day/30 Days Discharge Instructions: Cover with ABD pad Secured With: Medipore T - 67M Medipore H Soft Cloth Surgical T ape ape, 2x2 (in/yd) (Generic) 1 x Per Day/30 Days Secured With: Kerlix Roll Sterile or Non-Sterile 6-ply 4.5x4 (yd/yd) (Generic) 1 x Per Day/30 Days Discharge Instructions: Apply Kerlix as directed Electronic Signature(s) Signed: 09/21/2022 12:17:49 PM By: Kalman Shan DO Entered By: Kalman Shan on 09/21/2022 11:35:07 -------------------------------------------------------------------------------- Problem List Details Patient Name: Date of Service: Heather Hitch [name]. 09/21/2022 10:30 [name] Medical  Record Number: [id number] Patient Account Number: [id number] Date of Birth/Sex: Treating RN: 01-Jun-1976 (46 y.o. Orvan Falconer Primary Care Provider: Raelene Bott Other Clinician: Massie Kluver Referring Provider: Treating Provider/Extender: Eddie North in Treatment: 27 Active Problems ICD-10 Encounter Code Description 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 03/16/2022 No Yes E11.621 Type 2 diabetes mellitus with foot ulcer 03/16/2022 No Yes E11.42 Type 2 diabetes mellitus with diabetic polyneuropathy 03/16/2022 No Yes L97.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 03/16/2022 No Yes of skin Mcnulty, Tana [name] ([id number]) [id number] Page 12 of 20 Inactive Problems Resolved Problems Electronic Signature(s) Signed: 09/21/2022 12:17:49 PM By: Kalman Shan DO Entered By: Kalman Shan on 09/21/2022 11:25:24 -------------------------------------------------------------------------------- Progress Note Details Patient Name: Date of Service: Heather Hitch [name]. 09/21/2022 10:30 [name]: [id number] Patient Account Number: [id number] Date of Birth/Sex: Treating RN:  [birth year] (46 y.o. Orvan Falconer Primary Care Provider: Raelene Bott Other Clinician: Massie Kluver Referring Provider: Treating Provider/Extender: Eddie North in Treatment: 27 Subjective Chief Complaint Information obtained from Patient 03/19/2021; patient referred by Dr. Luana Shu who has been looking after her left foot for quite [name] period of time for review of [name] nonhealing area in the left midfoot 03/12/2022; bilateral feet wounds and right lower extremity wound. History of Present Illness  (HPI) 01/18/18-She is here for initial evaluation of the left great toe ulcer. She is [name] poor historian in regards to timeframe in detail. She states approximately 4 weeks ago she lacerated her toe on something in the house. She followed up with her primary care who placed her on Bactrim and ultimately [name] second dose of Bactrim prior to coming to wound clinic. She states she has been treating the toe with peroxide, Betadine and [name] Band-Aid. She did not check her blood sugar this morning but checked it yesterday morning it was 327; she is unaware of [name] recent A1c and there are no current records. She saw Dr. she would've orthopedics last week for an old injury to the left ankle, she states he did not see her toe, nor did she bring it to his attention. She smokes approximately 1 pack cigarettes [name] day. Her social situation is concerning, she arrives this morning with her mother who appears extremely intoxicated/under the influence; her mother was asked to leave the room and be monitored by the patient's grandmother. The patient's aunt then accompanied the patient and the room throughout the rest of the appointment. We had [name] lengthy discussion regarding the deleterious effects of uncontrolled hyperglycemia and smoking as it relates to wound healing and overall health. She was strongly encouraged to decrease her smoking and get her diabetes under better control. She states she is currently on [name] diet and has cut down her [facility] consumption. The left toe is erythematous, macerated and slightly edematous with malodor present. The edema in her left foot is below her baseline, there is no erythema streaking. We will treat her with Santyl, doxycycline; we have ordered and xray, culture and provided [name] Peg assist surgical shoe and cultured the wound. 01/25/18-She is here in follow-up evaluation for [name] left great toe ulcer and presents with an abscess to her suprapubic area. She states her blood sugars  remain elevated, feeling "sick" and if levels are below 250, but she is trying. She has made no attempt to decrease her smoking stating that we "can't take away her food in her cigarettes". She has been compliant with offloading using the PEG assist you. She is using Santyl daily. the culture obtained last week grew staph aureus and Enterococcus faecalis; continues on the doxycycline and Augmentin was added on Monday. The suprapubic area has erythema, no femoral variation, purple discoloration, minimal induration, was accessed with [name] cotton tip applicator with sanguinopurulent drainage, this was cultured, I suspect the current antibiotic treatment will cover and we will not add anything to her current treatment plan. She was advised to go to urgent care or ER with any change in redness, induration or fever. 02/01/18-She is here in follow-up evaluation for left great toe ulcers and [name] new abdominal abscess from last week. She was able to use packing until earlier this week, where she "forgot it was there". She states she was feeling ill with GI symptoms last week and was not taking her antibiotic. She states her glucose levels have been predominantly  less than 200, with occasional levels between 200-250. She thinks this was contributing to her GI symptoms as they have resolved without intervention. There continues to be significant laceration to left toe, otherwise it clinically looks stable/improved. There is now less superficial opening to the lateral aspect of the great toe that was residual blister. We will transition to [facility] to all wounds, she will continue her Augmentin. If there is no change or deterioration next week for reculture. 02/08/18-She is here in follow-up evaluation for left great toe ulcer and abdominal ulcer. There is an improvement in both wounds. She has been wrapping her left toe with coban, not by our direction, which has created an area of discoloration to the medial  aspect; she has been advised to NOT use coban secondary to her neuropathy. She states her glucose levels have been high over this last week ranging from 200-350, she continues to smoke. She admits to being less compliant with her offloading shoe. We will continue with same treatment plan and she will follow-up next week. 02/15/18-She is here in follow-up evaluation for left great toe ulcer and abdominal ulcer. The abdominal ulcer is epithelialized. The left great toe ulcer is improved and all injury from last week using the Coban wrap is resolved, the lateral ulcer is healed. She admits to noncompliance with wearing offloading shoe and admits to glucose levels being greater than 300 most of the week. She continues to smoke and expresses no desire to quit. There is one area medially that probes deeper than it has historically, erythema to the toe and dorsal foot has consistently waxed and waned. There is no overt signs of cellulitis or infection but we will culture the wound for any occult infection given the new area of depth and erythema. We will hold off on sensitivities for initiation of antibiotic therapy. 02/22/18-She is here in follow up evaluation for left great toe ulcer. There is overall significant improvement in both wound appearance, erythema and edema with changes made last week. She was not initiated on antibiotic therapy. Culture obtained last week showed oxacillin sensitive staph aureus, sensitive to clindamycin. Clindamycin has been called into the pharmacy but she has been instructed to hold off on initiation secondary to overall clinical improvement and her history of antibiotic intolerance. She has been instructed to contact the clinic with any noted changes/deterioration and the wound, erythema, edema and/or pain. She will follow-up next week. She continues to smoke and her glucose levels remain elevated >250; she admits to compliance with offloading shoe 03/01/18 on evaluation today  patient appears to be doing fairly well in regard to her left first toe ulcer. She has been tolerating the dressing changes with the [facility] Dressing without complication and overall this has definitely showed signs of improvement according to records as well is what the patient tells ERRIN, WHITELAW ([id number]) [id number] Page 13 of 20 me today. I'm very pleased in that regard. She is having no pain today 03/08/18 She is here for follow up evaluation of [name] left great toe ulcer. She remains non-compliant with glucose control and smoking cessation; glucose levels consistently >200. She states that she got new shoe inserts/peg assist. She admits to compliance with offloading. Since my last evaluation there is significant improvement. We will switch to prisma at this time and she will follow up next week. She is noted to be tachycardic at this appointment, heart rate 120s; she has [name] history of heart rate 70-130 according to our records. She  admits to extreme agitation r/t personal issues; she was advised to monitor her heartrate and contact her physician if it does not return to [name] more normal range (<100). She takes cardizem twice daily. 03/15/18-She is here in follow-up evaluation for left great toe ulcer. She remains noncompliant with glucose control and smoking cessation. She admits to compliance with wearing offloading shoe. The ulcer is improved/stable and we will continue with the same treatment plan and she will follow-up next week 03/22/18-She is here for evaluation for left great toe ulcer. There continues to be significant improvement despite recurrent hyperglycemia (over 500 yesterday) and she continues to smoke. She has been compliant with offloading and we will continue with same treatment plan and she will follow-up next week. 03/29/18-She is here for evaluation for left great toe ulcer. Despite continuing to smoke and uncontrolled diabetes she continues to  improve. She is compliant with offloading shoe. We will continue with the same treatment plan and she will follow-up next week 04/05/18- She is here in follow up evaluation for [name] left great toe ulcer; she presents with small pustule to left fifth toe (resembles ant bite). She admits to compliance with wearing offloading shoe; continues to smoke or have uncontrolled blood glucose control. There is more callus than usual with evidence of bleeding; she denies known trauma. 04/12/18-She is here for evaluation of left great toe ulcer. Despite noncompliance with glycemic control and smoking she continues to make improvement. She continues to wear offloading shoe. The pustule, that was identified last week, to the left fifth toe is resolved. She will follow-up in 2 weeks 05/03/18-she is seen in follow-up evaluation for [name] left great toe ulcer. She is compliant with offloading, otherwise noncompliant with glycemic control and smoking. She has plateaued and there is minimal improvement noted. We will transition to [facility], replaced the insert to her surgical shoe and she will follow-up in one week 05/10/18- She is here in follow up evaluation for [name] left great toe ulcer. It appears stable despite measurement change. We will continue with same treatment plan and follow up next week. 05/24/18-She is seen in follow-up evaluation for [name] left great toe ulcer. She remains compliant with offloading, has made significant improvement in her diet, decreasing the amount of sugar/soda. She said her recent A1c was 10.9 which is lower than. She did see [name] diabetic nutritionist/educator yesterday. She continues to smoke. We will continue with the same treatment plan and she'll follow-up next week. 05/31/18- She is seen in follow-up evaluation for left great toe ulcer. She continues to remain compliant with offloading, continues to make improvement in her diet, increasing her water and decreasing the amount of sugar/soda. She  does continue to smoke with no desire to quit. We will apply Prisma to the depth and Hydrofera Blue over. We have not received insurance authorization for oasis. She will follow up next week. 06/07/18-She is seen in follow-up evaluation for left great toe ulcer. It has stalled according to today's measurements although base appears stable. She says she saw [name] diabetic educator yesterday; her average blood sugars are less than 300 which is an improvement for her. She continues to smoke and states "that's my next step" She continues with water over soda. We will order for xray, culture and reinstate ace wrap compression prior to placing apligraf for next week. She is voicing no complaints or concerns. Her dressing will change to iodoflex over the next week in preparation for apligraf. 06/14/18-She is seen in follow-up evaluation  for left great toe ulcer. Plain film x-ray performed last week was negative for osteomyelitis. Wound culture obtained last week grew strep B and OSSA; she is initiated on keflex and cefdinir today; there is erythema to the toe which could be from ace wrap compression, she has [name] history of wrapping too tight and has has been encouraged to maintain ace wraps that we place today. We will hold off on application of apligraf today, will apply next week after antibiotic therapy has been initiated. She admits today that she has resumed taking [name] shower with her foot/toe submerged in water, she has been reminded to keep foot/toe out of the bath water. She will be seen in follow up next week 06/21/18-she is seen in follow-up evaluation for left great toe ulcer. She is tolerating antibiotic therapy with no GI disturbance. The wound is stable. Apligraf was applied today. She has been decreasing her smoking, only had 4 cigarettes yesterday and 1 today. She continues being more compliant in diabetic diet. She will follow-up next week for evaluation of site, if stable will remove at 2  weeks. 06/28/18- She is here in follow up evalution. Apligraf was placed last week, she states the dressing fell off on Tuesday and she was dressing with hydrofera blue. She is healed and will be discharged from the clinic today. She has been instructed to continue with smoking cessation, continue monitoring glucose levels, offloading for an additional 4 weeks and continue with hydrofera blue for additional two weeks for any possible microscopic opening. Readmission: 08/07/18 on evaluation today patient presents for reevaluation concerning the ulcer of her right great toe. She was previously discharged on 06/28/18 healed. Nonetheless she states that this began to show signs of drainage she subsequently went to her primary care provider. Subsequently an x-ray was performed on 08/01/18 which was negative. The patient was also placed on antibiotics at that time. Fortunately they should have been effective for the infection. Nonetheless she's been experiencing some improvement but still has [name] lot of drainage coming from the wound itself. 08/14/18 on evaluation today patient's wound actually does show signs of improvement in regard to the erythema at this point. She has completed the antibiotics. With that being said we did discuss the possibility of placing her in [name] total contact cast as of today although I think that I may want to give this just [name] little bit more time to ensure nothing recurrence as far as her infection is concerned. I do not want to put in the cast and risk infection at that time if things are not completely resolved. With that being said she is gonna require some debridement today. 08/21/18 on evaluation today patient actually appears to be doing okay in regard to her toe ulcer. She's been tolerating the dressing changes without complication. With that being said it does appear that she is ready and in fact I think it's appropriate for [place] to go ahead and initiate the total contact cast  today. Nonetheless she will require some sharp debridement to prepare the wound for application. Overall I feel like things have been progressing well but we do need to do something to get this to close more readily. 08/24/18 patient seen today for reevaluation after having had the total contact cast applied on Tuesday. She seems to have done very well the wound appears to be doing great and overall I'm pleased with the progress that she's made. There were no abnormal areas of rubbing from the cast on her  lower extremity. 08/30/18 on evaluation today patient actually appears to be completely healed in regard to her plantar toe ulcer. She tells me at this point she's been having [name] lot of issues with the cast. She almost fell [name] couple of times the state shall the step of her dog [name] couple times as well. This is been [name] very frustrating process for her other nonetheless she has completely healed the wound which is excellent news. Overall there does not appear to be the evidence of infection at this time which is great news. 09/11/18 evaluation today patient presents for follow-up concerning her great toe ulcer on the left which has unfortunately reopened since I last saw her which was only [name] couple of weeks ago. Unfortunately she was not able to get in to get the shoe and potentially the AFO that's gonna be necessary due to her left foot drop. She continues with offloading shoe but this is not enough to prevent her from reopening it appears. When we last had her in the total contact cast she did well from [name] healing standpoint but unfortunately the wound reopened as soon as she came out of the cast within just [name] couple of weeks. 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 This is also part of the reason why she  falls on [name] regular basis. I just do not believe that is gonna be safe for [place] to reinitiate the total contact cast as last time we had this on she fell 3 times one week which is definitely not normal for her. 09/18/18 upon evaluation today the patient actually appears to be doing about the same in regard to her toe ulcer. She did not contact [employer] as I asked her to even though I had given her the prescription. In fact she actually states that she has no idea where the prescription is. She did apparently call [employer] and they told her that all she needed to do was bring the prescription in order to be able to be seen and work on getting the AFO for her left foot. With all that being said she still does not have an appointment and I'm not sure were things stand that regard. I will give her [name] new prescription today in order to contact them to get this set up. 09/25/18 on evaluation today patient actually appears to be doing about the same in regard to her toes ulcer. She does have [name] small areas which seems to have [name] lot of callous buildup around the edge of the wound which is going to need sharp debridement today. She still is waiting to be scheduled for evaluation with [employer] for possibility of an AFO. She states there supposed to call her tomorrow to get this set up. Unfortunately it does appear that her foot specifically the toe area is showing signs of erythema. There does not appear to be any systemic infection which is in these good news. 10/02/18 on evaluation today patient actually appears to be doing about the same in regard to her toe ulcer. This really has not done too well although it's not significantly larger it's also not significantly smaller. She has been tolerating the dressing changes without complication. She actually has her appointment with [facility] and [facility] tomorrow to hopefully be  measured for obtaining and AFO splint. I think this would be helpful preventing this from  reoccurring. We had KYARA, BOXER [name] ([id number]) [id number] Page 14 of 20 contemplated starting the cast this week although to be honest I am reluctant to do that as she's been having nausea, vomiting, and seizure activity over the past three days. She has [name] history of seizures and have been told is nothing that can be done for these. With that being said I do believe that along with the seizures have the nausea vomiting which upon further questioning doesn't seem to be the normal for her and makes me concerned for the possibility of infection or something else going on. I discussed this with the patient and her mother during the office visit today. I do not feel the wound is effective but maybe something else. The responses this was "this just happens to her at times and we don't know why". They did not seem to be interested in going to the hospital to have this checked out further. 10/09/18 on evaluation today patient presents for follow-up concerning her ongoing toe ulcer. She has been tolerating the dressing changes without complication. Fortunately there does not appear to be any evidence of infection which is great news however I do think that the patient would benefit from going ahead for with the total contact cast. She's actually in [name] wheelchair today she tells me that she will use her walker if we initiate the cast. I was very specific about the fact that if we were gonna do the cast I wanted to make sure that she was using the walker in order to prevent any falls. She tells me she does not have stairs that she has to traverse on [name] regular basis at her home. She has not had any seizures since last week again that something that happens to her often she tells me she did talk to [employer] and they said that it may take up to three weeks to get the brace approved for her. Hopefully that will not take that long but nonetheless in the meantime I do think the cast could  be of benefit. 10/12/18 on evaluation today patient appears to be doing rather well in regard to her toe ulcer. It's just been [name] few days and already this is significantly improved both as far as overall appearance and size. Fortunately there's no sign of infection. She is here for her first obligatory cast change. 10/19/18 Seen today for follow up and management of left great toe ulcer. Wound continues to show improvement. Noted small open area with seroussang drainage with palpation. Denies any increased pain or recent fevers during visit. She will continue calcium alginate with offloading shoe. Denies any questions or concerns during visit. 10/26/18 on evaluation today patient appears to be doing about the same as when I last saw her in regard to her wound bed. Fortunately there does not appear to be any signs of infection. Unfortunately she continues to have [name] breakdown in regard to the toe region any time that she is not in the cast. It takes almost no time at all for this to happen. Nonetheless she still has not heard anything from the brace being made by [employer] as to when exactly this will be available to her. Fortunately there is no signs of infection at this time. 10/30/18 on evaluation today patient presents for application of the total contact cast as we just received him this morning. Fortunately we are gonna be  able to apply this to her today which is great news. She continues to have no significant pain which is good news. Overall I do feel like things have been improving while she was the cast is when she doesn't have [name] cast that things get worse. She still has not really heard anything from [place] regarding her brace. 11/02/18 upon evaluation today patient's wound already appears to be doing significantly better which is good news. Fortunately there does not appear to be any signs of infection also good news. Overall I do think the total contact cast as before is helping to heal this area  unfortunately it's just not gonna likely keep the area closed and healed without her getting her brace at least. Again the foot drop is [name] significant issue for her. 11/09/18 on evaluation today patient appears to be doing excellent in regard to her toe ulcer which in fact is completely healed. Fortunately we finally got the situation squared away with the paperwork which was needed to proceed with getting her brace approved by Medicaid. I have filled that out unfortunately that information has been sent to the orthopedic office that I worked at 2 1/2 years ago and not tired Current wound care measures. Fortunately she seems to be doing very well at this time. 11/23/18 on evaluation today patient appears to be doing More Poorly Compared to Last Time I Saw Her. At [facility] She Had Completely Healed. Currently she is continuing to have issues with reopening. She states that she just found out that the brace was approved through Medicaid now she just has to go get measured in order to have this fitted for her and then made. Subsequently she does not have an appointment for this yet that is going to complicate things we obviously cannot put her back in the cast if we do not have everything measured because they're not gonna be able to measure her foot while she is in the cast. 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 Obviously this is unfortunate and does have me somewhat worried as well. 11/30/18 on evaluation today patient's toe ulcer actually appears to be doing fairly well. The good news is she will be getting her brace in the shoes next week on Wednesday. Hopefully we will be able to get this to heal without having to go back in the cast however she may need the cast in order to get the wound completely heal and then go from there. Fortunately there's no signs of infection at this time. 12/07/18 on  evaluation today patient fortunately did receive her brace and she states she could tell this definitely makes her walk better. With that being said she's been having issues with her toe where she noticed yesterday there was [name] lot of tissue that was loosing off this appears to be much larger than what it was previous. She also states that her leg has been read putting much across the top of her foot just about the ankle although this seems to be receiving somewhat. The total area is still red and appears to be someone infected as best I can tell. She is previously taken Bactrim and that may be [name] good option for her today as well. We are gonna see what I wound culture shows as well and I think that this is definitely appropriate. With that being said outside of the  culture I still need to initiate something in the interim and that's what I'm gonna go ahead and select Bactrim is [name] good option for her. 12/14/18 on evaluation today patient appears to be doing better in regard to her left great toe ulcer as compared to last week's evaluation. There's still some erythema although this is significantly improved which is excellent news. Overall I do believe that she is making good progress is still gonna take some time before she is where I would like her to be from the standpoint of being able to place her back into the total contact cast. Hopefully we will be where we need to be by next week. 12/21/18 on evaluation today patient actually appears to be doing poorly in regard to her toe ulcer. She's been tolerating the dressing changes without complication. Fortunately there's no signs of systemic infection although she does have [name] lot of drainage from the toe ulcer and this does seem to be causing some issues at this point. She does have erythema on the distal portion of her toe that appears to be likely cellulitis. 12/28/18 on evaluation today patient actually appears to be doing [name] little better in my pinion in  regard to her toe ulcer. With that being said she still does have some evidence of infection at this time and for her culture she had both E. coli as well as enterococcus as organisms noted on evaluation. For that reason I think that though the Keflex likely has treated the E. coli rather well this has really done nothing for the enterococcus. We are going to have to initiate treatment for this specifically. 01/04/19 on evaluation today patient's toe actually appears to be doing better from the standpoint of infection. She currently would like to see about putting the cash back on I think that this is appropriate as long as she takes care of it and keeps it from getting wet. She is gonna have some drainage we can definitely pass this up with Drawtex and alginate to try to prevent as much drainage as possible from causing the problems. With that being said I do want to at least try her with the cast between now and Tuesday. If there any issues we can't continue to use it then I will discontinue the use of the cast at that point. 01/08/19 on evaluation today patient actually appears to be doing very well as far as her foot ulcer specifically the great toe on the left is concerned. She did have an area of rubbing on the medial aspect of her left ankle which again is from the cast. Fortunately there's no signs of infection at this point in this appears to be [name] very slight skin breakdown. The patient tells me she felt it rubbing but didn't think it was that bad. Fortunately there is no signs of active infection at this time which is good news. No fevers, chills, nausea, or vomiting noted at this time. 01/15/19 on evaluation today patient actually appears to be doing well in regard to her toe ulcer. Again as previous she seems to do well and she has the cast on which indicates to me that during the time she doesn't have [name] cast on she's putting way too much pressure on this region. Obviously I think that's gonna  be an issue as with the current national emergency concerning the Covid-19 Virus it has been recommended that we discontinue the use of total contact casting by the chief medical officer of our  company, Dr. Simona Huh. The reasoning is that if [name] patient becomes sick and cannot come into have the cast removed they could not just leave this on for an additional two weeks. Obviously the hospitals also do not want to receive patient's who are sick into the emergency department to potentially contaminate the region and spread the Covid-19 Virus among other sick individuals within the hospital system. Therefore at this point we are suspending the use of total contact cast until the current emergency subsides. This was all discussed with the patient today as well. 01/22/19 on evaluation today patient's wound on her left great toe appears to be doing slightly worse than previously noted last week. She tells me that she has been on this quite [name] bit in fact she tells me she's been awake for 38 straight hours. This is due to the fact that she's having to care for grandparents because MYLIYAH, REBUCK [name] ([id number]) [id number] Page 15 of 20 nobody else will. She has been taking care of them for five the last seven days since I've seen her they both have dementia his is from [name] stroke and her grandmother's was progressive. Nonetheless she states even her mom who knows her condition and situation has only help two of those days to take care of them she's been taking care of the rest. Fortunately there does not appear to be any signs of active infection in regard to her toe at this point although obviously it doesn't look as good as it did previous. I think this is directly related to her not taking off the pressure and friction by way of taking things easy. Though I completely understand what's going on. 01/29/19 on evaluation today patient's tools are actually appears to be showing some signs of  improvement today compared to last week's evaluation as far as not necessarily the overall size of the wound but the fact that she has some new skin growth in between the two ends of the wound opening. Overall I feel like she has done well she states that she had [name] family member give her what sounds to be [name] CAM walker boot which has been helpful as well. 02/05/19 on evaluation today patient's wound bed actually appears to be doing significantly better in regard to her overall appearance of the size of the wound. With that being said she is still having an issue with offloading efficiently enough to get this to close. Apparently there is some signs of infection at this point as well unfortunately. Previously she's done well of Augmentin I really do not see anything that needs to be culture currently but there theme and cellulitis of the foot that I'm seeing I'm gonna go ahead and place her on an antibiotic today to try to help clear this up. 02/12/2019 on evaluation today patient actually appears to be doing poorly in regard to her overall wound status. She tells me she has been using her offloading shoe but actually comes in today wearing her tennis shoe with the AFO brace. Again as I previously discussed with her this is really not sufficient to allow the area to heal appropriately. Nonetheless she continues to be somewhat noncompliant and I do wonder based on what she has told my nurse in the past as to whether or not [name] good portion of this noncompliance may be recreational drug and alcohol related. She has had [name] history of heroin overdose and this was fairly recently in the past couple of months that have been  seeing her. Nonetheless overall I feel like her wound looks significantly worse today compared to what it was previous. She still has significant erythema despite the Augmentin I am not sure that this is an appropriate medication for her infection I am also concerned that the infection may have  gone down into her bone. 02/19/19 on evaluation today patient actually appears to be doing about the same in regard to her toe ulcer. Unfortunately she continues to show signs of bone exposure and infection at this point. There does not appear to be any evidence of worsening of the infection but I'm also not really sure that it's getting significantly better. She is on the Augmentin which should be sufficient for the Staphylococcus aureus infection that she has at this point. With that being said she may need IV antibiotics to more appropriately treat this. We did have [name] discussion today about hyperbaric option therapy. 02/28/19 on evaluation today patient actually appears to be doing much worse in regard to the wound on her left great toe as compared to even my previous evaluation last week. Unfortunately this seems to be training in [name] pretty poor direction. Her toe was actually now starting to angle laterally and I can actually see the entire joint area of the proximal portion of the digit where is the distal portion of the digit again is no longer even in contact with the joint line. Unfortunately there's [name] lot more necrotic tissue around the edge and the toe appears to be showing signs of becoming gangrenous in my pinion. I'm very concerned about were things stand at this point. She did see infectious disease and they are planning to send in [name] prescription for Sivextro for her and apparently this has been approved. With that being said I don't think she should avoid taking this but at the same time I'm not sure that it's gonna be sufficient to save her toe at this point. She tells me that she still having to care for grandparents which I think is putting quite [name] bit of strain on her foot and specifically the total area and has caused this to break down even to [name] greater degree than would've otherwise been expected. 03/05/19 on evaluation today patient actually appears to be doing quite well in regard to  her toe all things considering. She still has bone exposed but there appears to be much less your thing on overall the appearance of the wound and the toe itself is dramatically improved. She still does have some issues currently obviously with infection she did see vascular as well and there concerned that her blood flow to the toad. For that reason they are setting up for an angiogram next week. 03/14/19 on evaluation today patient appears to be doing very poor in regard to her toe and specifically in regard to the ulceration and the fact that she's starting to notice the toe was leaning even more towards the lateral aspect and the complete joint is visible on the proximal aspect of the joint. Nonetheless she's also noted [name] significant odor and the tip of the toe is turning more dark and necrotic appearing. Overall I think she is getting worse not better as far as this is concerned. For that reason I am recommending at this point that she likely needs to be seen for likely amputation. READMISSION 03/19/2021 This is [name] patient that we cared for in this clinic for [name] prolonged period of time in 2019 and 2020 with [name] left foot  and left first toe wound. 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 Luana Shu. In December 2021 she had an ulcer on her right great toe as well as the fourth and fifth toes. 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 She also had an angiogram at that time and underwent angioplasty of the right anterior tibial artery. In any case she claims that the wound on the right foot is closed I did not look at this today which was probably an oversight although I think that should be done next week. 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 According to Dr. Deborra Medina last review that she was out of the area  being cared for by another physician but recently came back to his attention. 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 [name] culture of this area showed E. coli apparently before she came back to see Dr. Luana Shu she was supposed to be receiving antibiotics but she did not really take them. Nor is she offloading this area at all. Finally her last hemoglobin A1c listed in epic was in March 2022 at 14.1 she says things are [name] lot better since then although I am not sure. 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 She had this wound at the time. During this admission she had E. coli sepsis [name] TEE was negative. 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 She is still [name] heavy cigarette smoker. She has [name] history of bipolar disorder seizure disorder ABI in our clinic was 1.05 6/1; left midfoot in the setting of [name] TMA done previously. Round circular wound with [name] "knuckle" of protruding tissue. The problem is that the knuckle was not attached to any of the surrounding granulation and this probed proximally widely I removed [name] large portion of this tissue. This wound goes with considerable undermining laterally. I do not feel any bone there was no purulence but this is [name] deep wound. 6/8; in spite of the debridement I did last week. She arrives with [name] wound looking exactly the same. [name] protruding "knuckle" of tissue nonadherent to most of the surrounding tissue. There is considerable depth around this from 6-12 o'clock at 2.7 cm and undermining of 1 cm. This does not look overtly infected and the x- ray I did last week was negative for any osseous abnormalities. We have  been using silver collagen 6/15; deep tissue culture I did last week showed moderate staph aureus and moderate Pseudomonas. This will definitely require prolonged antibiotic therapy. The pathology on the protuberant area was negative for malignancy fungus etc. 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 We have been using silver collagen. I am going to be prescribing Levaquin for 2 weeks. Her CT scan of the foot is down for 7/5 6/22; CT scan of the foot on 7 5. She says she has hardware in the left leg from her previous fracture. She is on the Levaquin for the deep tissue culture I did that showed methicillin sensitive staph  aureus and Pseudomonas. I gave her [name] 2-week supply and she will have another week. She arrives in clinic today with the same protuberant tissue however this is nonadherent to the tissue surrounding it. I am really at [name] loss to explain this unless there is underlying deep tissue infection 6/29; patient presents for 1 week follow-up. She has been using collagen to the wound bed. She reports taking her antibiotics as prescribed.She has no complaints or issues today. She denies signs of infection. 7/6; patient presents for one week followup. She has been using collagen to the wound bed. She states she is taking Levaquin however at times she is not able to keep it down. She denies signs of infection. 7/13; patient presents for 1 week follow-up. She has been using silver alginate to the wound bed. She still has nausea when taking her antibiotics. She denies signs of infection. 7/20; patient presents for 1 week follow-up. She has been using silver alginate with gentamicin cream to the wound bed. She denies any issues and has no complaints today. She denies signs of infection. 7/27; patient presents for 1 week follow-up. She continues to use silver alginate with gentamicin cream to the wound bed. She reports starting her  antibiotics. VEDHA, TERCERO [name] ([id number]) [id number] Page 16 of 20 She has no issues or complaints. Overall she reports stability to the wound. 8/3; patient presents for 1 week follow-up. She has been using silver alginate with gentamicin cream to the wound bed. She reports completing all antibiotics. She has no issues or complaints today. She denies signs of infection. 8/17; patient presents for 2-week follow-up. He is to use silver alginate to the wound bed. She has no issues or complaints today. She denies signs of infection. She reports her pain has improved in her foot since last clinic visit 8/24; patient presents for 1 week follow-up. She continues to use silver alginate to the wound bed. She has no issues or complaints. She denies signs of infection. Pain is stable. 9/7; patient presents for follow-up. She missed her last week appointment due to feeling ill. She continues to use silver alginate. She has [name] new wound to the right lower extremity that is covered in eschar. She states It occurred over the past week and has no idea how it started. She currently denies signs of infection. 9/14; patient presents for follow-up. T the left foot wound she has been using gentamicin cream and silver alginate. T the right lower extremity wound she has o o been keeping this covered and has not obtain Santyl. 9/21; patient presents for follow-up. She reports using gentamicin cream and silver alginate to the left foot and Santyl to the right lower extremity wound. She has no issues or complaints today. She denies signs of infection. 9/28; patient presents for follow-up. She reports [name] new wound to her right heel. She states this occurred [name] few days ago and is progressively gotten worse. She has been trying to clean the area with [name] Q-tip and Santyl. She reports stability in the other 2 wounds. She has been using gentamicin cream and silver alginate to the left foot and Santyl to  the right lower extremity wound. 10/12; patient presents for follow-up. She reports improvement to the wound beds. She is seeing vein and vascular to discuss the potential of [name] left BKA. She states they are going to do an arteriogram. She continues to use silver alginate with dressing changes to her wounds. 11/2; patient presents for follow-up. She states she  has not been doing dressing changes to the wound beds. She states she is not able to offload the areas. She reports chronic pain to her left foot wound. 11/9; patient presents for follow-up. She came in with only socks on. She states she forgot to put on shoes. It is unclear if she is doing any dressing changes. She currently denies systemic signs of infection. 11/16; patient presents for follow-up. She came again only with socks on. She states she does not wear shoes ever. It is unclear if she does dressing changes. She currently denies systemic signs of infection. 11/23; patient presents for follow-up. She wore her shoes today. It still unclear exactly what dressing she is using for each wound but she did states she obtained Dakin's solution and has been using this to the left foot wound. She currently denies signs of infection. 11/30; patient presents for follow-up. She has no issues or complaints today. She currently denies signs of infection. 12/7; patient presents for follow-up. She has no issues or complaints today. She has been using Hydrofera Blue to the right heel wound and Dakin solution to the left foot wound. Her right anterior leg wound is healed. She currently denies signs of infection. 12/14; patient presents for follow-up. She has been using Hydrofera Blue to the right heel and Dakin's to the left foot wounds. She has no issues or complaints today. She denies signs of infection. 12/21; patient presents for follow-up. She reports using Hydrofera Blue to the right heel and Dakin's to the left foot wound. She denies signs of  infection. 12/28; patient presents for follow-up. She continues to use Dakin's to the left foot wound and Hydrofera Blue to the right heel wound. She denies signs of infection. 1/4; patient presents for follow-up. She has no issues or complaints today. She denies signs of infection. 1/11; patient presents for follow-up. It is unclear if she has been dressing these wounds over the past week. She currently denies signs of infection. 1/18; patient presents for follow-up. She states she has been using Dakin's wet-to-dry dressings to the left foot. She has been using Hydrofera Blue to the right foot foot wound. She states that the anterior right leg wound has reopened and draining serous fluid. She denies signs of infection. 1/25; patient presents for follow-up. She has no issues or complaints today. 2/1; patient presents for follow-up. She has no issues or complaints today. She denies signs of infection. 2/8; patient presents for follow-up. She has lost her surgical shoes. She did not have [name] dressing to the right heel wound. She currently denies signs of infection. 2/15; patient presents for follow-up. She reports more pain to the right heel today. She denies purulent drainage Or fever/chills 2/22; patient presents for follow-up. She reports taking clindamycin over the past week. She states that she continues to have pain to her right heel. She reports purulent drainage. Readmission 03/16/2022 Ms. Lexus Shampine is [name] 46 year old female with [name] past medical history of type 2 diabetes, osteomyelitis to her feet, chronic systolic heart failure and bipolar disorder that presents to the clinic for bilateral feet wounds and right lower extremity wound. She was last seen in our clinic on 12/15/2021. At that time she had purulent drainage coming out of her right plantar foot and I recommended she go to the ED. She states she went to [facility] and has been there for the past 3 months. I cannot see the  records. She states she had OR debridement and was  on several weeks of IV antibiotics while inpatient. Since discharge she has not been taking care of the wound beds. She had nothing on her feet other than socks today. She currently denies signs of infection. 5/31; patient presents for follow-up. She has been using Dakin's wet-to-dry dressings to the wound beds on her feet bilaterally and antibiotic ointment to the right anterior leg wound. She had [name] wound culture done at last clinic visit that showed moderate Pseudomonas aeruginosa sensitive to ciprofloxacin. She currently denies systemic signs of infection. 6/14; patient presents for follow-up. She received Keystone 5 days ago and has been using this on the wound beds. She states that last week she had to go to the hospital because she had increased warmth and erythema to the right foot. She was started on 2 oral antibiotics. She states she has been taking these. She currently denies systemic signs of infection. She has no issues or complaints today. 6/21; patient presents for follow-up. She states she has been using Keystone antibiotics to the wound beds. She has no issues or complaints today. She denies signs of infection. 6/28; patient presents for follow-up. She has been using Keystone antibiotics to the wound beds. She has no issues or complaints today. 7/12; patient presents for follow-up. Has been using Keystone antibiotics to the wound beds with calcium alginate. She has no issues or complaints today. She never followed up with her orthopedic surgeon who did the OR debridement to the right foot. We discussed the total contact cast for the left foot and patient would like to do this next week. 7/19; patient presents for follow-up. She has been using Keystone antibiotics with calcium alginate to the wound beds. She has no issues or complaints today. Patient is in agreement to do the total contact cast of the left foot today. She knows to  return later this week for the obligatory cast change. CAMYLA, CAMPOSANO [name] ([id number]) [id number] Page 17 of 20 05-13-2022 upon evaluation today patient's wound which she has the cast of the left leg actually appears to be doing significantly better. Fortunately I do not see any signs of active infection locally or systemically which is great news and overall I am extremely pleased with where we stand currently. 7/26; patient presents for follow-up. She has [name] cast in place for the past week. She states it irritated her shin. Other than that she tolerated the cast well. She states she would like [name] break for 1 week from the cast. We have been using Keystone antibiotic and Aquacel to both wound beds. She denies signs of infection. 8/2; patient presents for follow-up. She has been using Keystone and Aquacel to the wound beds. She denies any issues and has no complaints. She is agreeable to have the cast placed today for the left leg. 06-03-2022 upon evaluation today patient appears to be doing well with regard to her wound she saw some signs of improvement which is great news. Fortunately I do not see any evidence of active infection locally or systemically at this time which is great news. No fevers, chills, nausea, vomiting, or diarrhea. 8/16; patient presents for follow-up. She has no issues or complaints today. We have been using Keystone and Aquacel to the wound beds. The left lower extremity is in [name] total contact cast. She is tolerated this well. 8/23; patient presents for follow-up. She has had the total contact cast on the left leg for the past week. Unfortunately this has rubbed and broken down the skin to  the medial foot. She currently denies signs of infection. She has been using Keystone antibiotic to the right plantar foot wound. 8/30; patient presents for follow-up. We have held off on the total contact cast for the left leg for the past week. Her wound on the left  foot has improved and the previous surrounding breakdown of skin has epithelialized. She has been using Keystone antibiotic to both wound beds. She has no issues or complaints today. She denies signs of infection. 9/6; patient presents for follow-up. She has ordered her's Keystone antibiotic and this is arriving this week. She has been doing Dakin's wet-to-dry dressings to the wound beds. She denies signs of infection. She is agreeable to the total contact cast today. 9/13; patient presents for follow-up. She states that the cast caused her left leg shin to be sore. She would like to take [name] break from the cast this week. She has been using Keystone antibiotic to the right plantar foot wound. She denies signs of infection. 9/20; patient presents for follow-up. She has been using Keystone antibiotics to the wound beds with calcium alginate to the right foot wound and Hydrofera Blue to the left foot wound. She is agreeable to having the cast placed today. She has been approved for Apligraf and we will order this for next clinic visit. 9/27; patient presents for follow-up. We have been using Keystone antibiotic with Hydrofera Blue to the left foot wound under [name] total contact cast. T the right o foot wound she has been using Keystone antibiotic and calcium alginate. She declines [name] total contact cast today. Apligraf is available for placement and she would like to proceed with this. 07-28-2022 upon evaluation today patient appears to be doing well currently in regard to her wound. She is actually showing signs of significant improvement which is great news. Fortunately I do not see any evidence of active infection locally nor systemically at this time. She has been seeing Dr. Heber Wagoner and to be honest has been doing very well with the cast. Subsequently she comes in today with [name] cast on and we did reapply that today as well. She did not really want to she try to talk me out of that but I explained that if  she wanted to heal this is really the right way to go. Patient voiced understanding. In regard to her right foot this is actually [name] lot better compared to the last time I saw her which is also great news. 10/11; patient presents for follow-up. Apligraf and the total contact cast was placed to the left leg at last clinic visit. She states that her right foot wound had burning pain to it with the placement of Apligraf to this area. She has been doing [place] over this area. She denies signs of infection including increased warmth, erythema or purulent drainage. 11/1; 3-week follow-up. The patient fortunately did not have [name] total contact cast or an Apligraf and on the left foot. She has been using Keystone ABD pads and kerlix and her own running shoes She arrives in clinic today with thick callus and [name] very poor surface on the left foot on the right nonviable skin subcutaneous tissue and [name] deep probing hole. 11/15; patient missed her last clinic appointment. She states she has not been dressing the wound beds for the past 2 weeks. She states that at she had [name] new roommate but is now going back to live with her mother. Apparently its been [name] distracting 2 weeks. Patient currently  denies signs of infection. 11/22; patient presents for follow-up. She states she has been using Keystone antibiotic and Dakin's wet-to-dry dressings to the wound beds. She is agreeable for cast placement today. We had ordered Apligraf however this has not been received by our facility. 11/29; Patient had [name] total contact cast placed at last clinic visit and she tolerated this well. We were using silver alginate under the cast. Patient's been using Keystone antibiotic with Aquacel to the right plantar foot wound. She has no issues or complaints today. Apligraf is available for placement today. Patient would like to proceed with this. Objective Constitutional Vitals Time Taken: 10:30 AM, Height: 69 in, Weight: 178 lbs, BMI: 26.3,  Temperature: 98.5 F, Pulse: 55 bpm, Respiratory Rate: 16 breaths/min, Blood Pressure: 119/72 mmHg. General Notes: Right foot: T the plantar heel there is an incision site with increased depth.T the opening there is granulation tissue. Left foot: T the medial o o o aspect there is an open wound with granulation tissue and nonviable tissue with surrounding callus. No overt signs of infection to any of the wound beds. Integumentary (Hair, Skin) Wound #11 status is Open. Original cause of wound was Surgical Injury. The date acquired was: 12/01/2021. The wound has been in treatment 27 weeks. The wound is located on the [place]. The wound measures 0.5cm length x 0.7cm width x 0.7cm depth; 0.275cm^2 area and 0.192cm^3 volume. There is Fat Layer (Subcutaneous Tissue) exposed. There is no tunneling or undermining noted. There is [name] medium amount of serosanguineous drainage noted. The wound margin is distinct with the outline attached to the wound base. There is medium (34-66%) pink, pale granulation within the wound bed. There is [name] medium (34-66%) amount of necrotic tissue within the wound bed. Wound #12 status is Open. Original cause of wound was Pressure Injury. The date acquired was: 03/16/2020. The wound has been in treatment 27 weeks. The wound is located on the [place]. The wound measures 2.4cm length x 1.5cm width x 0.3cm depth; 2.827cm^2 area and 0.848cm^3 volume. There is Fat Layer (Subcutaneous Tissue) exposed. There is [name] medium amount of serous drainage noted. The wound margin is flat and intact. There is large NEVAYAH, FAUST [name] ([id number]) [id number] Page 18 of 20 (67-100%) red, pink granulation within the wound bed. There is no necrotic tissue within the wound bed. 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 I debrided nonviable tissue. I recommended continuing with silver alginate and Keystone antibiotic with aggressive offloading to the right foot wound. Apligraf was placed in standard fashion to the left foot wound. T contact cast was placed in standard fashion as well otal today. Follow-up in 1 week. Procedures Wound #11 Pre-procedure diagnosis of Wound #11 is an Open Surgical Wound located on the Right,Plantar Foot . There was [name] Excisional Skin/Subcutaneous Tissue Debridement with [name] total area of 0.8 sq cm performed by Kalman Shan, MD. With the following instrument(s): Curette to remove Viable and Non-Viable tissue/material. Material removed includes Callus, Subcutaneous Tissue, and Slough. [name] time out was conducted at 11:08, prior to the start of the procedure. [name] Minimum amount of bleeding was controlled with Pressure. The procedure was tolerated well.  Post Debridement Measurements: 0.5cm length x 0.7cm width x 0.7cm depth; 0.192cm^3 volume. Character of Wound/Ulcer Post Debridement is stable. Post procedure Diagnosis Wound #11: Same as Pre-Procedure Wound #12 Pre-procedure diagnosis of Wound #12 is [name] Diabetic Wound/Ulcer of the Lower Extremity located on the Left,Medial,Plantar Foot .Severity of Tissue Pre Debridement is: Fat layer exposed. There was [name] Excisional Skin/Subcutaneous Tissue Debridement with [name] total area of 6 sq cm performed by Kalman Shan, MD. With the following instrument(s): Curette to remove Viable and Non-Viable tissue/material. Material removed includes Callus, Subcutaneous Tissue, and Slough. [name] time out was conducted at 11:04, prior to the start of the procedure. [name] Minimum amount of bleeding was controlled with Pressure.  The procedure was tolerated well. Post Debridement Measurements: 2.4cm length x 1.5cm width x 0.3cm depth; 0.848cm^3 volume. Character of Wound/Ulcer Post Debridement is stable. Severity of Tissue Post Debridement is: Fat layer exposed. Post procedure Diagnosis Wound #12: Same as Pre-Procedure Pre-procedure diagnosis of Wound #12 is [name] Diabetic Wound/Ulcer of the Lower Extremity located on the Left,Medial,Plantar Foot. [name] skin graft procedure using [name] bioengineered skin substitute/cellular or tissue based product was performed by Kalman Shan, MD with the following instrument(s): Blade, Forceps, and Scissors. 22 sq cm of product was utilized and 22 sq cm was wasted due to wound size. Post Application, Aquacel,bolster was applied. [name] Time Out was conducted at 11:14, prior to the start of the procedure. The procedure was tolerated well. Post procedure Diagnosis Wound #12: Same as Pre-Procedure . Pre-procedure diagnosis of Wound #12 is [name] Diabetic Wound/Ulcer of the Lower Extremity located on the Left,Medial,Plantar Foot . There was [name] T Contact otal Cast Procedure by Kalman Shan, MD. Post procedure Diagnosis Wound #12: Same as Pre-Procedure Plan Follow-up Appointments: Return Appointment in 1 week. Nurse Visit as needed Bathing/ Shower/ Hygiene: May shower with wound dressing protected with water repellent cover or cast protector. No tub bath. Anesthetic (Use 'Patient Medications' Section for Anesthetic Order Entry): Lidocaine applied to wound bed Edema Control - Lymphedema / Segmental Compressive Device / Other: Elevate, Exercise Daily and Avoid Standing for Long Periods of Time. Elevate legs to the level of the heart and pump ankles as often as possible Elevate leg(s) parallel to the floor when sitting. Off-Loading: T Contact Cast to Left Lower Extremity otal Open toe surgical shoe - Heel off loader - Right foot Other: - keep pressure off of feet. Medications-Please add to  medication list.: Wound #12 Left,Medial,Plantar Foot: Keystone Compound - Keystone gel right foot WOUND #11: - Foot Wound Laterality: Plantar, Right Cleanser: Dakin 16 (oz) 0.25 1 x Per Day/30 Days Discharge Instructions: Use as directed. JACEY, ECKERSON [name] ([id number]) [id number] Page 19 of 20 Cleanser: Soap and Water 1 x Per Day/30 Days Discharge Instructions: Gently cleanse wound with antibacterial soap, rinse and pat dry prior to dressing wounds Topical: keystone gel 1 x Per Day/30 Days Prim Dressing: Aquacel Extra Hydrofiber Dressing, 2x2 (in/in) 1 x Per Day/30 Days ary Secondary Dressing: ABD Pad 5x9 (in/in) (Generic) 1 x Per Day/30 Days Discharge Instructions: Cover with ABD pad Secured With: Medipore T - 74M Medipore H Soft Cloth Surgical T ape ape, 2x2 (in/yd) (Generic) 1 x Per Day/30 Days Secured With: Kerlix Roll Sterile or Non-Sterile 6-ply 4.5x4 (yd/yd) (Generic) 1 x Per Day/30 Days Discharge Instructions: Apply Kerlix as directed WOUND #12: - Foot Wound Laterality: Plantar, Left, Medial Cleanser: Dakin 16 (oz) 0.25 (Dispense As Written) 1 x Per Day/30 Days Discharge Instructions: Use as directed. Topical: Vitamin AandD  Ointment, 4(oz) tube 1 x Per Day/30 Days Discharge Instructions: apply to red, broken down area Prim Dressing: Gauze 1 x Per Day/30 Days ary Discharge Instructions: moisten with Dakins solution Secondary Dressing: ABD Pad 5x9 (in/in) (Generic) 1 x Per Day/30 Days Discharge Instructions: Cover with ABD pad Secured With: Medipore T - 71M Medipore H Soft Cloth Surgical T ape ape, 2x2 (in/yd) (Generic) 1 x Per Day/30 Days Secured With: Kerlix Roll Sterile or Non-Sterile 6-ply 4.5x4 (yd/yd) (Generic) 1 x Per Day/30 Days Discharge Instructions: Apply Kerlix as directed 1. In office sharp debridement 2. Apligraf placed in standard fashion 3. T contact cast placed in standard fashion -Calcium alginate backing otal 4. Silver alginate 5.  Keystone antibiotic 6. Aggressive offloading to the right foot with surgical shoe and Pegasys Electronic Signature(s) Signed: 09/21/2022 12:17:49 PM By: Kalman Shan DO Entered By: Kalman Shan on 09/21/2022 11:33:39 -------------------------------------------------------------------------------- Total Contact Cast Details Patient Name: Date of Service: CHANIECE, BARBATO 09/21/2022 10:30 [name]: [id number] Patient Account Number: [id number] Date of Birth/Sex: Treating RN: 1976-06-19 (46 y.o. Orvan Falconer Primary Care Provider: Raelene Bott Other Clinician: Massie Kluver Referring Provider: Treating Provider/Extender: Eddie North in Treatment: 27 T Contact Cast Applied for Wound Assessment: otal Wound #12 Left,Medial,Plantar Foot Performed By: Physician Kalman Shan, MD Post Procedure Diagnosis Same as Pre-procedure Electronic Signature(s) Signed: 09/21/2022 12:17:49 PM By: Kalman Shan DO Signed: 09/22/2022 4:33:44 PM By: Massie Kluver Entered By: Massie Kluver on 09/21/2022 11:13:56 [name] ([id number]) [id number] Page 20 of 20 -------------------------------------------------------------------------------- SuperBill Details Patient Name: Date of Service: [name], [name] 09/21/2022 Medical Record Number: [id number] Patient Account Number: [id number] Date of Birth/Sex: Treating RN: [birth year] (46 y.o. Orvan Falconer Primary Care Provider: Raelene Bott Other Clinician: Massie Kluver Referring Provider: Treating Provider/Extender: Eddie North in Treatment: 27 Diagnosis Coding ICD-10 Codes Code Description [phone] 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ity 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.621 Type 2 diabetes mellitus with foot ulcer E11.42  Type 2 diabetes mellitus with diabetic polyneuropathy L97.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 of skin Facility Procedures : CPT4 Code: [id number] Description: 70350 - SKIN SUB GRAFT FACE/NK/HF/G ICD-10 Diagnosis Description 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i E11.621 Type 2 diabetes mellitus with foot ulcer Modifier: ty Quantity: 1 : CPT4 Code: [id number] Description: 93716 - DEB SUBQ TISSUE 20 SQ CM/< ICD-10 Diagnosis Description L97.512 Non-pressure chronic ulcer of other part of right foot with fat layer exposed Modifier: Quantity: 1 : CPT4 Code: [id number] Description: [phone] (Facility Use Only) Apligraf 44 SQ CM ICD-10 Diagnosis Description 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i Modifier: ty Quantity: 44 Physician Procedures : CPT4 Code Description Modifier [id number] - WC PHYS SKIN SUB GRAFT FACE/NK/HF/G ICD-10 Diagnosis Description 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ity E11.621 Type 2 diabetes mellitus with foot ulcer Quantity: 1 : [id number] 31540 - WC PHYS SUBQ TISS 20 SQ CM ICD-10 Diagnosis Description L97.512 Non-pressure chronic ulcer of other part of right foot with fat layer exposed Quantity: 1 Electronic Signature(s) Signed: 09/22/2022 4:33:44 PM By: Massie Kluver Previous Signature: 09/21/2022 12:17:49 PM Version By: Kalman Shan DO Entered By: Massie Kluver on 09/22/2022 16:32:33

## 2022-09-22 NOTE — Progress Notes (Signed)
[name], [name] ([id number]) [id number] Page 1 of 11 Visit Report for 09/21/2022 Arrival Information Details Patient Name: Date of Service: [name] 09/21/2022 10:30 [name]: [id number] Patient Account Number: [id number] Date of Birth/Sex: Treating RN: 02-Jun-1976 (46 y.o. Orvan Falconer Primary Care Benson Porcaro: Raelene Bott Other Clinician: Massie Kluver Referring Pablo Mathurin: Treating Kerissa Coia/Extender: Eddie North in Treatment: 27 Visit Information History Since Last Visit All ordered tests and consults were completed: No Patient Arrived: Wheel Chair Added or deleted any medications: No Arrival Time: 10:28 Any new allergies or adverse reactions: No Transfer Assistance: EasyPivot Patient Lift Had [name] fall or experienced change in No Patient Identification Verified: Yes activities of daily living that may affect Secondary Verification Process Completed: Yes risk of falls: Patient Requires Transmission-Based Precautions: No Signs or symptoms of abuse/neglect since last visito No Patient Has Alerts: No Hospitalized since last visit: No Implantable device outside of the clinic excluding No cellular tissue based products placed in the center since last visit: Has Dressing in Place as Prescribed: Yes Has Footwear/Offloading in Place as Prescribed: Yes Left: T Contact Cast otal Wedge Shoe Pain Present Now: Yes Electronic Signature(s) Signed: 09/22/2022 4:33:44 PM By: Massie Kluver Entered By: Massie Kluver on 09/21/2022 10:29:33 -------------------------------------------------------------------------------- Clinic Level of Care Assessment Details Patient Name: Date of Service: [name] 09/21/2022 10:30 [name]: [id number] Patient Account Number: [id number] Date of Birth/Sex: Treating RN: 06-29-1976 (46 y.o. Orvan Falconer Primary Care Caryn Gienger: Raelene Bott Other  Clinician: Massie Kluver Referring Marquie Aderhold: Treating Michal Strzelecki/Extender: Eddie North in Treatment: 27 Clinic Level of Care Assessment Items TOOL 1 Quantity Score _0  - 0 Use when EandM and Procedure is performed on INITIAL visit ASSESSMENTS - Nursing Assessment / Reassessment [name], [name] ([id number]) [id number] Page 2 of 11 _1  - 0 General Physical Exam (combine [name]/ comprehensive assessment (listed just below) when performed on new pt. evals) _2  - 0 Comprehensive Assessment (HX, ROS, Risk Assessments, Wounds Hx, etc.) ASSESSMENTS - Wound and Skin Assessment / Reassessment _3  - 0 Dermatologic / Skin Assessment (not related to wound area) ASSESSMENTS - Ostomy and/or Continence Assessment and Care _4  - 0 Incontinence Assessment and Management _5  - 0 Ostomy Care Assessment and Management (repouching, etc.) PROCESS - Coordination of Care _6  - 0 Simple Patient / Family Education for ongoing care _7  - 0 Complex (extensive) Patient / Family Education for ongoing care _8  - 0 Staff obtains Programmer, systems, Records, T Results / Process Orders est _9  - 0 Staff telephones HHA, Nursing Homes / Clarify orders / etc _10  - 0 Routine Transfer to another Facility (non-emergent condition) _11  - 0 Routine Hospital Admission (non-emergent condition) _12  - 0 New Admissions / Biomedical engineer / Ordering NPWT Apligraf, etc. , _13  - 0 Emergency Hospital Admission (emergent condition) PROCESS - Special Needs _14  - 0 Pediatric / Minor Patient Management _15  - 0 Isolation Patient Management _16  - 0 Hearing / Language / Visual special needs _17  - 0 Assessment of Community assistance (transportation, D/C planning, etc.) _18  - 0 Additional assistance / Altered mentation _19  - 0 Support Surface(s) Assessment (bed, cushion, seat, etc.) INTERVENTIONS - Miscellaneous _20  - 0 External ear exam _21  - 0 Patient Transfer (multiple staff / Civil Service fast streamer /  Similar devices) _22  - 0 Simple Staple / Suture removal (25 or less) _23  - 0 Complex Staple / Suture removal (26 or more) _24  - 0 Hypo/Hyperglycemic Management (do not check if billed separately) _25  -  0 Ankle / Brachial Index (ABI) - do not check if billed separately Has the patient been seen at the hospital within the last three years: Yes Total Score: 0 Level Of Care: ____ Electronic Signature(s) Signed: 09/22/2022 4:33:44 PM By: Massie Kluver Entered By: Massie Kluver on 09/21/2022 11:21:10 -------------------------------------------------------------------------------- Encounter Discharge Information Details Patient Name: Date of Service: [name] 09/21/2022 10:30 [name]: [id number] Patient Account Number: [id number] [name], [name] ([id number]) [id number] Page 3 of 11 Date of Birth/Sex: Treating RN: Apr 11, 1976 (46 y.o. Orvan Falconer Primary Care Louellen Haldeman: Other Clinician: Glade Nurse Referring Toiya Morrish: Treating Kimora Stankovic/Extender: Eddie North in Treatment: 27 Encounter Discharge Information Items Post Procedure Vitals Discharge Condition: Stable Temperature (F): 98.5 Ambulatory Status: Wheelchair Pulse (bpm): 55 Discharge Destination: Home Respiratory Rate (breaths/min): 16 Transportation: Private Auto Blood Pressure (mmHg): 119/72 Accompanied By: mother Schedule Follow-up Appointment: Yes Clinical Summary of Care: Electronic Signature(s) Signed: 09/22/2022 4:33:44 PM By: Massie Kluver Entered By: Massie Kluver on 09/21/2022 16:54:18 -------------------------------------------------------------------------------- Lower Extremity Assessment Details Patient Name: Date of Service: [name], [name] 09/21/2022 10:30 [name]: [id number] Patient Account Number: [id number] Date of Birth/Sex: Treating RN: 08-20-1976 (46 y.o. Orvan Falconer Primary Care  Arabell Neria: Raelene Bott Other Clinician: Massie Kluver Referring Alexxus Sobh: Treating Darshana Curnutt/Extender: Linna Hoff Weeks in Treatment: 27 Electronic Signature(s) Signed: 09/22/2022 4:33:44 PM By: Massie Kluver Signed: 09/22/2022 4:40:57 PM By: Carlene Coria RN Entered By: Massie Kluver on 09/21/2022 10:55:00 -------------------------------------------------------------------------------- Multi Wound Chart Details Patient Name: Date of Service: [name], Heather Dunn [name]. 09/21/2022 10:30 [name]: [id number] Patient Account Number: [id number] Date of Birth/Sex: Treating RN: May 12, 1976 (46 y.o. Orvan Falconer Primary Care Shanera Meske: Raelene Bott Other Clinician: Massie Kluver Referring Misheel Gowans: Treating Annjeanette Sarwar/Extender: Eddie North in Treatment: 27 Vital Signs Height(in): 69 Pulse(bpm): 55 Weight(lbs): 178 Blood Pressure(mmHg): 119/72 Body Mass Index(BMI): 26.3 Temperature(F): 98.5 Kops, Clovis [name] ([id number]) [id number] Page 4 of 11 Respiratory Rate(breaths/min): 16 [11:Photos:] [N/[name]:N/[name]] Right, Plantar Foot Left, Medial, Plantar Foot N/[name] Wound Location: Surgical Injury Pressure Injury N/[name] Wounding Event: Open Surgical Wound 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: Extremity Chronic sinus problems/congestion, Chronic sinus problems/congestion, N/[name] Comorbid History: Middle ear problems, Anemia, Chronic Middle ear problems, Anemia, Chronic Obstructive Pulmonary Disease Obstructive Pulmonary Disease (COPD), Congestive Heart Failure, (COPD), Congestive Heart Failure, Type II Diabetes, End Stage Renal Type II Diabetes, End Stage Renal Disease, History of pressure wounds, Disease, History of pressure wounds, Neuropathy Neuropathy 12/01/2021 03/16/2020 N/[name] Date Acquired: 47 27 N/[name] Weeks of Treatment: Open Open N/[name] Wound Status: No No N/[name] Wound Recurrence: 0.5x0.7x0.7  2.4x1.5x0.3 N/[name] Measurements L x [name] x D (cm) 0.275 2.827 N/[name] [name] (cm) : rea 0.192 0.848 N/[name] Volume (cm) : 85.10% 40.00% N/[name] % Reduction in Area: 88.40% 10.00% N/[name] % Reduction in Volume: Full Thickness Without Exposed Grade 3 N/[name] Classification: Support Structures Medium Medium N/[name] Exudate Amount: Serosanguineous Serous N/[name] Exudate Type: red, brown amber N/[name] Exudate Color: Distinct, outline attached Flat and Intact N/[name] Wound Margin: Medium (34-66%) Large (67-100%) N/[name] Granulation Amount: Pink, Pale Red, Pink N/[name] Granulation Quality: Medium (34-66%) None Present (0%) N/[name] Necrotic Amount: Fat Layer (Subcutaneous Tissue): Yes Fat Layer (Subcutaneous Tissue): Yes N/[name] Exposed Structures: Fascia: No Fascia: No Tendon: No Tendon: No Muscle: No Muscle: No Joint: No Joint: No Bone: No Bone: No Medium (34-66%) Small (1-33%) N/[name] Epithelialization: Treatment Notes Electronic Signature(s) Signed: 09/22/2022 4:33:44 PM By: Clifton James,  Angie Entered By: Massie Kluver on 09/21/2022 10:55:21 -------------------------------------------------------------------------------- [place] Details Patient Name: Date of Service: [name], [name] 09/21/2022 10:30 [name]: [id number] Patient Account Number: [id number] Date of Birth/Sex: Treating RN: [birth year] (46 y.o. Orvan Falconer Primary Care Tyger Wichman: Raelene Bott Other Clinician: Massie Kluver Referring Hoang Reich: Treating Tajah Noguchi/Extender: Eddie North in Treatment: [address], [place] ([id number]) [id number] Page 5 of 11 Active Inactive Abuse / Safety / Falls / Self Care Management Nursing Diagnoses: History of Falls Potential for falls Potential for injury related to falls Goals: Patient will not develop complications from immobility Date Initiated: 03/17/2022 Date Inactivated: 04/20/2022 Target Resolution Date: 03/16/2022 Goal  Status: Met Patient/caregiver will verbalize understanding of skin care regimen Date Initiated: 03/17/2022 Target Resolution Date: 03/16/2022 Goal Status: Active Patient/caregiver will verbalize/demonstrate measure taken to improve self care Date Initiated: 03/17/2022 Target Resolution Date: 03/16/2022 Goal Status: Active Interventions: Assess fall risk on admission and as needed Provide education on basic hygiene Provide education on personal and home safety Notes: Necrotic Tissue Nursing Diagnoses: Impaired tissue integrity related to necrotic/devitalized tissue Knowledge deficit related to management of necrotic/devitalized tissue Goals: Necrotic/devitalized tissue will be minimized in the wound bed Date Initiated: 03/17/2022 Date Inactivated: 05/04/2022 Target Resolution Date: 03/16/2022 Goal Status: Met Patient/caregiver will verbalize understanding of reason and process for debridement of necrotic tissue Date Initiated: 03/17/2022 Target Resolution Date: 03/16/2022 Goal Status: Active Interventions: Provide education on necrotic tissue and debridement process Treatment Activities: Apply topical anesthetic as ordered : 03/16/2022 Excisional debridement : 03/16/2022 Notes: Osteomyelitis Nursing Diagnoses: Infection: osteomyelitis Knowledge deficit related to disease process and management Goals: Patient/caregiver will verbalize understanding of disease process and disease management Date Initiated: 03/17/2022 Date Inactivated: 04/20/2022 Target Resolution Date: 03/16/2022 Goal Status: Met Patient's osteomyelitis will resolve Date Initiated: 03/17/2022 Target Resolution Date: 03/16/2022 Goal Status: Active Signs and symptoms for osteomyelitis will be recognized and promptly addressed Date Initiated: 03/17/2022 Target Resolution Date: 03/16/2022 Goal Status: Active Interventions: Assess for signs and symptoms of osteomyelitis resolution every visit Provide education on  osteomyelitis Treatment Activities: Systemic antibiotics : 03/16/2022 Notes: ANAGHA, LOSEKE ([id number]) [id number] Page 6 of 11 Wound/Skin Impairment Nursing Diagnoses: Impaired tissue integrity Knowledge deficit related to smoking impact on wound healing Knowledge deficit related to ulceration/compromised skin integrity Goals: Patient/caregiver will verbalize understanding of skin care regimen Date Initiated: 03/17/2022 Date Inactivated: 04/20/2022 Target Resolution Date: 03/16/2022 Goal Status: Met Ulcer/skin breakdown will have [name] volume reduction of 30% by week 4 Date Initiated: 03/17/2022 Target Resolution Date: 04/13/2022 Goal Status: Active Ulcer/skin breakdown will have [name] volume reduction of 50% by week 8 Date Initiated: 03/17/2022 Target Resolution Date: 05/04/2022 Goal Status: Active Ulcer/skin breakdown will have [name] volume reduction of 80% by week 12 Date Initiated: 03/17/2022 Target Resolution Date: 06/01/2022 Goal Status: Active Ulcer/skin breakdown will heal within 14 weeks Date Initiated: 03/17/2022 Target Resolution Date: 06/15/2022 Goal Status: Active Interventions: Assess ulceration(s) every visit Provide education on ulcer and skin care Treatment Activities: Skin care regimen initiated : 03/16/2022 Notes: Electronic Signature(s) Signed: 09/22/2022 4:33:44 PM By: Massie Kluver Signed: 09/22/2022 4:40:57 PM By: Carlene Coria RN Entered By: Massie Kluver on 09/21/2022 10:55:08 -------------------------------------------------------------------------------- Pain Assessment Details Patient Name: Date of Service: [name] 09/21/2022 10:30 [name]: [id number] Patient Account Number: [id number] Date of Birth/Sex: Treating RN: 1976-01-04 (46 y.o. Orvan Falconer Primary Care Onica Davidovich: Raelene Bott Other Clinician: Massie Kluver Referring Amariyon Maynes: Treating Alika Eppes/Extender: Linna Hoff  Weeks in Treatment: 27 Active Problems Location of Pain Severity and Description of Pain Patient Has Paino Yes Site Locations Pain LocationJENSEN, [name] ([id number]) [id number] Page 7 of 11 Pain Location: Pain in Ulcers Duration of the Pain. Constant / Intermittento Constant Rate the pain. Current Pain Level: 5 Character of Pain Describe the Pain: Aching, Sharp Pain Management and Medication Current Pain Management: Medication: Yes Cold Application: No Rest: Yes Massage: No Activity: No T.E.N.S.: No Heat Application: No Leg drop or elevation: No Is the Current Pain Management Adequate: Inadequate How does your wound impact your activities of daily livingo Sleep: No Bathing: No Appetite: No Relationship With Others: No Bladder Continence: No Emotions: No Bowel Continence: No Work: No Toileting: No Drive: No Dressing: No Hobbies: No Electronic Signature(s) Signed: 09/22/2022 4:33:44 PM By: Massie Kluver Signed: 09/22/2022 4:40:57 PM By: Carlene Coria RN Entered By: Massie Kluver on 09/21/2022 10:37:12 -------------------------------------------------------------------------------- Patient/Caregiver Education Details Patient Name: Date of Service: [name] 11/29/2023andnbsp10:30 [name]: [id number] Patient Account Number: [id number] Date of Birth/Gender: Treating RN: 26-May-1976 (46 y.o. Orvan Falconer Primary Care Physician: Raelene Bott Other Clinician: Massie Kluver Referring Physician: Treating Physician/Extender: Eddie North in Treatment: 33 Education Assessment Education Provided To: Patient Education Topics Provided Wound/Skin Impairment: Handouts: Other: continue wound care as directed KATHLEE, BARNHARDT [name] ([id number]) [id number] Page 8 of 11 Methods: Explain/Verbal Responses: State content correctly Electronic Signature(s) Signed:  09/22/2022 4:33:44 PM By: Massie Kluver Entered By: Massie Kluver on 09/22/2022 16:33:12 -------------------------------------------------------------------------------- Wound Assessment Details Patient Name: Date of Service: [name], [name] 09/21/2022 10:30 [name]: [id number] Patient Account Number: [id number] Date of Birth/Sex: Treating RN: [birth year] (46 y.o. Orvan Falconer Primary Care Adynn Caseres: Raelene Bott Other Clinician: Massie Kluver Referring Etienne Mowers: Treating Kismet Facemire/Extender: Linna Hoff Weeks in Treatment: 27 Wound Status Wound Number: 11 Primary Open Surgical Wound Etiology: Wound Location: Right, Plantar Foot Wound Open Wounding Event: Surgical Injury Status: Date Acquired: 12/01/2021 Comorbid Chronic sinus problems/congestion, Middle ear problems, Anemia, Weeks Of Treatment: 27 History: Chronic Obstructive Pulmonary Disease (COPD), Congestive Heart Clustered Wound: No Failure, Type II Diabetes, End Stage Renal Disease, History of pressure wounds, Neuropathy Photos Wound Measurements Length: (cm) 0.5 Width: (cm) 0.7 Depth: (cm) 0.7 Area: (cm) 0.275 Volume: (cm) 0.192 % Reduction in Area: 85.1% % Reduction in Volume: 88.4% Epithelialization: Medium (34-66%) Tunneling: No Undermining: No Wound Description Classification: Full Thickness Without Exposed Suppo Wound Margin: Distinct, outline attached Exudate Amount: Medium Exudate Type: Serosanguineous Exudate Color: red, brown rt Structures Foul Odor After Cleansing: No Slough/Fibrino Yes Wound Bed Granulation Amount: Medium (34-66%) Exposed Structure Granulation Quality: Pink, Pale Fascia Exposed: No Necrotic Amount: Medium (34-66%) Fat Layer (Subcutaneous Tissue) Exposed: Yes Tendon Exposed: No Soward, Tauheedah [name] ([id number]) [id number] Page 9 of 11 Muscle Exposed: No Joint Exposed: No Bone Exposed: No Treatment Notes Wound  #11 (Foot) Wound Laterality: Plantar, Right Cleanser Dakin 16 (oz) 0.25 Discharge Instruction: Use as directed. Soap and Water Discharge Instruction: Gently cleanse wound with antibacterial soap, rinse and pat dry prior to dressing wounds Peri-Wound Care Topical keystone gel Primary Dressing Aquacel Extra Hydrofiber Dressing, 2x2 (in/in) Secondary Dressing ABD Pad 5x9 (in/in) Discharge Instruction: Cover with ABD pad Secured With Medipore T - 21M Medipore H Soft Cloth Surgical T ape ape, 2x2 (in/yd) Kerlix Roll Sterile or Non-Sterile 6-ply 4.5x4 (yd/yd) Discharge Instruction: Apply Kerlix as directed Compression Wrap Compression Stockings Add-Ons Electronic Signature(s) Signed: 09/22/2022 4:33:44 PM By: Clifton James,  Angie Signed: 09/22/2022 4:40:57 PM By: Carlene Coria RN Entered By: Massie Kluver on 09/21/2022 10:54:11 -------------------------------------------------------------------------------- Wound Assessment Details Patient Name: Date of Service: [name], [name] 09/21/2022 10:30 [name]: [id number] Patient Account Number: [id number] Date of Birth/Sex: Treating RN: [birth year] (46 y.o. Orvan Falconer Primary Care Wilena Tyndall: Raelene Bott Other Clinician: Massie Kluver Referring Emiliano Welshans: Treating Destiny Hagin/Extender: Linna Hoff Weeks in Treatment: 27 Wound Status Wound Number: 12 Primary Diabetic Wound/Ulcer of the Lower Extremity Etiology: Wound Location: Left, Medial, Plantar Foot Wound Open Wounding Event: Pressure Injury Status: Date Acquired: 03/16/2020 Comorbid Chronic sinus problems/congestion, Middle ear problems, Anemia, Weeks Of Treatment: 27 History: Chronic Obstructive Pulmonary Disease (COPD), Congestive Heart Clustered Wound: No Failure, Type II Diabetes, End Stage Renal Disease, History of pressure wounds, Neuropathy Photos [name], WITTS [name] ([id number]) [id number] Page 10 of 11 Wound  Measurements Length: (cm) 2.4 Width: (cm) 1.5 Depth: (cm) 0.3 Area: (cm) 2.827 Volume: (cm) 0.848 % Reduction in Area: 40% % Reduction in Volume: 10% Epithelialization: Small (1-33%) Wound Description Classification: Grade 3 Wound Margin: Flat and Intact Exudate Amount: Medium Exudate Type: Serous Exudate Color: amber Foul Odor After Cleansing: No Slough/Fibrino No Wound Bed Granulation Amount: Large (67-100%) Exposed Structure Granulation Quality: Red, Pink Fascia Exposed: No Necrotic Amount: None Present (0%) Fat Layer (Subcutaneous Tissue) Exposed: Yes Tendon Exposed: No Muscle Exposed: No Joint Exposed: No Bone Exposed: No Treatment Notes Wound #12 (Foot) Wound Laterality: Plantar, Left, Medial Cleanser Dakin 16 (oz) 0.25 Discharge Instruction: Use as directed. Peri-Wound Care Topical Vitamin AandD Ointment, 4(oz) tube Discharge Instruction: apply to red, broken down area Primary Dressing Gauze Discharge Instruction: moisten with Dakins solution Secondary Dressing ABD Pad 5x9 (in/in) Discharge Instruction: Cover with ABD pad Secured With [place] H Soft Cloth Surgical T ape ape, 2x2 (in/yd) Kerlix Roll Sterile or Non-Sterile 6-ply 4.5x4 (yd/yd) Discharge Instruction: Apply Kerlix as directed Compression Wrap Compression Stockings Add-Ons Electronic Signature(s) Signed: 09/22/2022 4:33:44 PM By: Massie Kluver Signed: 09/22/2022 4:40:57 PM By: Carlene Coria RN Entered By: Massie Kluver on 09/21/2022 10:54:48 [name] ([id number]) [id number] Page 11 of 11 -------------------------------------------------------------------------------- Vitals Details Patient Name: Date of Service: SONOMA, FIRKUS 09/21/2022 10:30 [name]: [id number] Patient Account Number: [id number] Date of Birth/Sex: Treating RN: 1976-01-23 (46 y.o. Orvan Falconer Primary Care Jaculin Rasmus: Raelene Bott Other  Clinician: Massie Kluver Referring Juelz Claar: Treating Toini Failla/Extender: Eddie North in Treatment: 27 Vital Signs Time Taken: 10:30 Temperature (F): 98.5 Height (in): 69 Pulse (bpm): 55 Weight (lbs): 178 Respiratory Rate (breaths/min): 16 Body Mass Index (BMI): 26.3 Blood Pressure (mmHg): 119/72 Reference Range: 80 - 120 mg / dl Electronic Signature(s) Signed: 09/22/2022 4:33:44 PM By: Massie Kluver Entered By: Massie Kluver on 09/21/2022 10:37:07

## 2022-09-28 ENCOUNTER — Encounter: Payer: Medicaid Other | Attending: Internal Medicine | Admitting: Internal Medicine

## 2022-09-28 DIAGNOSIS — Z9181 History of falling: Secondary | ICD-10-CM | POA: Insufficient documentation

## 2022-09-28 DIAGNOSIS — I5022 Chronic systolic (congestive) heart failure: Secondary | ICD-10-CM | POA: Diagnosis not present

## 2022-09-28 DIAGNOSIS — L97512 Non-pressure chronic ulcer of other part of right foot with fat layer exposed: Secondary | ICD-10-CM | POA: Diagnosis not present

## 2022-09-28 DIAGNOSIS — E1122 Type 2 diabetes mellitus with diabetic chronic kidney disease: Secondary | ICD-10-CM | POA: Insufficient documentation

## 2022-09-28 DIAGNOSIS — L97528 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ity: Secondary | ICD-10-CM | POA: Insufficient documentation

## 2022-09-28 DIAGNOSIS — F17210 Nicotine dependence, cigarettes, uncomplicated: Secondary | ICD-10-CM | POA: Diagnosis not present

## 2022-09-28 DIAGNOSIS — E1142 Type 2 diabetes mellitus with diabetic polyneuropathy: Secondary | ICD-10-CM | POA: Diagnosis not present

## 2022-09-28 DIAGNOSIS — I130 Hypertensive heart and chronic kidney disease with heart failure and stage 1 through stage 4 chronic kidney disease, or unspecified chronic kidney disease: Secondary | ICD-10-CM | POA: Insufficient documentation

## 2022-09-28 DIAGNOSIS — E11621 Type 2 diabetes mellitus with foot ulcer: Secondary | ICD-10-CM | POA: Insufficient documentation

## 2022-09-28 DIAGNOSIS — Z89431 Acquired absence of right foot: Secondary | ICD-10-CM | POA: Diagnosis not present

## 2022-09-28 DIAGNOSIS — N183 Chronic kidney disease, stage 3 unspecified: Secondary | ICD-10-CM | POA: Diagnosis not present

## 2022-09-28 DIAGNOSIS — Z89432 Acquired absence of left foot: Secondary | ICD-10-CM | POA: Diagnosis not present

## 2022-09-30 NOTE — Progress Notes (Signed)
ARRIE, BORRELLI [name] ([id number]) [id number] Page 1 of 10 Visit Report for 09/28/2022 Arrival Information Details Patient Name: Date of Service: [name], [name] 09/28/2022 9:45 [name]: [id number] Patient Account Number: [id number] Date of Birth/Sex: Treating RN: 12-Oct-1976 (46 y.o. Orvan Falconer Primary Care Jahad Old: Raelene Bott Other Clinician: Massie Kluver Referring Jawaun Celmer: Treating Coolidge Gossard/Extender: Eddie North in Treatment: 28 Visit Information History Since Last Visit All ordered tests and consults were completed: No Patient Arrived: Wheel Chair Added or deleted any medications: No Arrival Time: 10:25 Any new allergies or adverse reactions: No Transfer Assistance: EasyPivot Patient Lift Had [name] fall or experienced change in No Patient Identification Verified: Yes activities of daily living that may affect Secondary Verification Process Completed: Yes risk of falls: Patient Requires Transmission-Based Precautions: No Signs or symptoms of abuse/neglect since last visito No Patient Has Alerts: No Hospitalized since last visit: No Implantable device outside of the clinic excluding No cellular tissue based products placed in the center since last visit: Has Dressing in Place as Prescribed: Yes Has Footwear/Offloading in Place as Prescribed: Yes Left: T Contact Cast otal Pain Present Now: No Electronic Signature(s) Signed: 09/30/2022 10:21:17 AM By: Massie Kluver Entered By: Massie Kluver on 09/28/2022 10:30:43 -------------------------------------------------------------------------------- Clinic Level of Care Assessment Details Patient Name: Date of Service: [name], [name] 09/28/2022 9:45 [name]: [id number] Patient Account Number: [id number] Date of Birth/Sex: Treating RN: 15-Nov-1975 (46 y.o. Orvan Falconer Primary Care Sadie Hazelett: Raelene Bott Other Clinician: Massie Kluver Referring Loye Reininger: Treating Owen Pratte/Extender: Eddie North in Treatment: 28 Clinic Level of Care Assessment Items TOOL 1 Quantity Score _0  - 0 Use when EandM and Procedure is performed on INITIAL visit ASSESSMENTS - Nursing Assessment / Reassessment _1  - 0 General Physical Exam (combine w/ comprehensive assessment (listed just below) when performed on new pt. evalsOBERIA, BEAUDOIN [name] ([id number]) [id number] Page 2 of 10 _2  - 0 Comprehensive Assessment (HX, ROS, Risk Assessments, Wounds Hx, etc.) ASSESSMENTS - Wound and Skin Assessment / Reassessment _3  - 0 Dermatologic / Skin Assessment (not related to wound area) ASSESSMENTS - Ostomy and/or Continence Assessment and Care _4  - 0 Incontinence Assessment and Management _5  - 0 Ostomy Care Assessment and Management (repouching, etc.) PROCESS - Coordination of Care _6  - 0 Simple Patient / Family Education for ongoing care _7  - 0 Complex (extensive) Patient / Family Education for ongoing care _8  - 0 Staff obtains Programmer, systems, Records, T Results / Process Orders est _9  - 0 Staff telephones HHA, Nursing Homes / Clarify orders / etc _10  - 0 Routine Transfer to another Facility (non-emergent condition) _11  - 0 Routine Hospital Admission (non-emergent condition) _12  - 0 New Admissions / Biomedical engineer / Ordering NPWT Apligraf, etc. , _13  - 0 Emergency Hospital Admission (emergent condition) PROCESS - Special Needs _14  - 0 Pediatric / Minor Patient Management _15  - 0 Isolation Patient Management _16  - 0 Hearing / Language / Visual special needs _17  - 0 Assessment of Community assistance (transportation, D/C planning, etc.) _18  - 0 Additional assistance / Altered mentation _19  - 0 Support Surface(s) Assessment (bed, cushion, seat, etc.) INTERVENTIONS - Miscellaneous _20  - 0 External ear exam _21  - 0 Patient Transfer (multiple staff / Civil Service fast streamer / Similar devices) _22  -  0 Simple Staple / Suture removal (25 or less) _23  - 0 Complex Staple / Suture removal (26 or more) _24  - 0 Hypo/Hyperglycemic Management (do not check if billed separately) _25  -  0 Ankle / Brachial Index (ABI) - do not check if billed separately Has the patient been seen at the hospital within the last three years: Yes Total Score: 0 Level Of Care: ____ Electronic Signature(s) Signed: 09/30/2022 10:21:17 AM By: Massie Kluver Entered By: Massie Kluver on 09/28/2022 11:12:45 -------------------------------------------------------------------------------- Encounter Discharge Information Details Patient Name: Date of Service: Heather Hitch [name]. 09/28/2022 9:45 [name]: [id number] Patient Account Number: [id number] Date of Birth/Sex: Treating RN: 12/02/1975 (46 y.o. Marcos, Peloso, Shandora [name] ([id number]) [id number] Page 3 of 10 Primary Care Ardie Dragoo: Raelene Bott Other Clinician: Massie Kluver Referring Ashden Sonnenberg: Treating Cledis Sohn/Extender: Eddie North in Treatment: 28 Encounter Discharge Information Items Post Procedure Vitals Discharge Condition: Stable Temperature (F): 98.3 Ambulatory Status: Wheelchair Pulse (bpm): 64 Discharge Destination: Home Respiratory Rate (breaths/min): 16 Transportation: Private Auto Blood Pressure (mmHg): 132/84 Accompanied By: mother Schedule Follow-up Appointment: Yes Clinical Summary of Care: Electronic Signature(s) Signed: 09/30/2022 10:21:17 AM By: Massie Kluver Entered By: Massie Kluver on 09/28/2022 16:17:50 -------------------------------------------------------------------------------- Lower Extremity Assessment Details Patient Name: Date of Service: SHEVAWN, LANGENBERG 09/28/2022 9:45 [name]: [id number] Patient Account Number: [id number] Date of Birth/Sex: Treating RN: 02-22-1976 (46 y.o. Orvan Falconer Primary Care Caliope Ruppert: Raelene Bott Other Clinician: Massie Kluver Referring Betti Goodenow: Treating Annaleah Arata/Extender: Linna Hoff Weeks in Treatment: 28 Electronic Signature(s) Signed: 09/28/2022 4:17:47 PM By: Carlene Coria RN Signed: 09/30/2022 10:21:17 AM By: Massie Kluver Entered By: Massie Kluver on 09/28/2022 10:48:14 -------------------------------------------------------------------------------- Multi Wound Chart Details Patient Name: Date of Service: Heather Hitch [name]. 09/28/2022 9:45 [name]: [id number] Patient Account Number: [id number] Date of Birth/Sex: Treating RN: May 16, 1976 (46 y.o. Orvan Falconer Primary Care Raelynn Corron: Raelene Bott Other Clinician: Massie Kluver Referring Dalon Reichart: Treating Denton Derks/Extender: Eddie North in Treatment: 28 Vital Signs Height(in): 69 Pulse(bpm): 64 Weight(lbs): 178 Blood Pressure(mmHg): 132/84 Body Mass Index(BMI): 26.3 Temperature(F): 98.3 Respiratory Rate(breaths/min): 16 Scallan, [name] ([id number]) [id number] Page 4 of 10 [11:Photos:] [N/[name]:N/[name]] Right, Plantar Foot Left, Medial, Plantar Foot N/[name] Wound Location: Surgical Injury Pressure Injury N/[name] Wounding Event: Open Surgical Wound 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: Extremity Chronic sinus problems/congestion, Chronic sinus problems/congestion, N/[name] Comorbid History: Middle ear problems, Anemia, Chronic Middle ear problems, Anemia, Chronic Obstructive Pulmonary Disease Obstructive Pulmonary Disease (COPD), Congestive Heart Failure, (COPD), Congestive Heart Failure, Type II Diabetes, End Stage Renal Type II Diabetes, End Stage Renal Disease, History of pressure wounds, Disease, History of pressure wounds, Neuropathy Neuropathy 12/01/2021 03/16/2020 N/[name] Date Acquired: 46 28 N/[name] Weeks of Treatment: Open Open N/[name] Wound Status: No No N/[name] Wound Recurrence: 0.4x0.4x0.7 2x1.7x0.1  N/[name] Measurements L x W x D (cm) 0.126 2.67 N/[name] [name] (cm) : rea 0.088 0.267 N/[name] Volume (cm) : 93.20% 43.30% N/[name] % Reduction in Area: 94.70% 71.70% N/[name] % Reduction in Volume: Full Thickness Without Exposed Grade 3 N/[name] Classification: Support Structures Medium Medium N/[name] Exudate Amount: Serosanguineous Serous N/[name] Exudate Type: red, brown amber N/[name] Exudate Color: Distinct, outline attached Flat and Intact N/[name] Wound Margin: Medium (34-66%) Large (67-100%) N/[name] Granulation Amount: Pink, Pale Red, Pink N/[name] Granulation Quality: Medium (34-66%) None Present (0%) N/[name] Necrotic Amount: Fat Layer (Subcutaneous Tissue): Yes Fat Layer (Subcutaneous Tissue): Yes N/[name] Exposed Structures: Fascia: No Fascia: No Tendon: No Tendon: No Muscle: No Muscle: No Joint: No Joint: No Bone: No Bone: No Medium (34-66%) Small (1-33%) N/[name] Epithelialization: Treatment Notes Electronic Signature(s) Signed: 09/30/2022 10:21:17 AM By: Clifton James,  Angie Entered By: Massie Kluver on 09/28/2022 10:48:18 -------------------------------------------------------------------------------- Multi-Disciplinary Care Plan Details Patient Name: Date of Service: VITA, CURRIN 09/28/2022 9:45 [name]: [id number] Patient Account Number: [id number] Date of Birth/Sex: Treating RN: December 31, 1975 (46 y.o. [name] Primary Care Ayven Glasco: Raelene Bott Other Clinician: Massie Kluver Referring Zalen Sequeira: Treating Dalanie Kisner/Extender: Eddie North in Treatment: [address] Equilla [name] ([id number]) [id number] Page 5 of 10 Active Inactive Abuse / Safety / Falls / Self Care Management Nursing Diagnoses: History of Falls Potential for falls Potential for injury related to falls Goals: Patient will not develop complications from immobility Date Initiated: 03/17/2022 Date Inactivated: 04/20/2022 Target Resolution Date: 03/16/2022 Goal Status:  Met Patient/caregiver will verbalize understanding of skin care regimen Date Initiated: 03/17/2022 Date Inactivated: 09/28/2022 Target Resolution Date: 03/16/2022 Goal Status: Met Patient/caregiver will verbalize/demonstrate measure taken to improve self care Date Initiated: 03/17/2022 Target Resolution Date: 03/16/2022 Goal Status: Active Interventions: Assess fall risk on admission and as needed Provide education on basic hygiene Provide education on personal and home safety Notes: Necrotic Tissue Nursing Diagnoses: Impaired tissue integrity related to necrotic/devitalized tissue Knowledge deficit related to management of necrotic/devitalized tissue Goals: Necrotic/devitalized tissue will be minimized in the wound bed Date Initiated: 03/17/2022 Target Resolution Date: 10/26/2022 Goal Status: Active Patient/caregiver will verbalize understanding of reason and process for debridement of necrotic tissue Date Initiated: 03/17/2022 Date Inactivated: 09/28/2022 Target Resolution Date: 03/16/2022 Goal Status: Met Interventions: Provide education on necrotic tissue and debridement process Treatment Activities: Apply topical anesthetic as ordered : 03/16/2022 Excisional debridement : 03/16/2022 Notes: Osteomyelitis Nursing Diagnoses: Infection: osteomyelitis Knowledge deficit related to disease process and management Goals: Patient/caregiver will verbalize understanding of disease process and disease management Date Initiated: 03/17/2022 Target Resolution Date: 03/16/2022 Goal Status: Active Patient's osteomyelitis will resolve Date Initiated: 03/17/2022 Target Resolution Date: 10/26/2022 Goal Status: Active Signs and symptoms for osteomyelitis will be recognized and promptly addressed Date Initiated: 03/17/2022 Target Resolution Date: 10/26/2022 Goal Status: Active Interventions: Assess for signs and symptoms of osteomyelitis resolution every visit Provide education on  osteomyelitis Treatment Activities: Systemic antibiotics : 03/16/2022 Notes: Patient on antibiotics. LIANDRA, MENDIA [name] ([id number]) [id number] Page 6 of 10 Electronic Signature(s) Signed: 09/28/2022 4:35:11 PM By: Gretta Cool, BSN, RN, CWS, [name] RN, BSN Signed: 09/30/2022 10:21:17 AM By: Massie Kluver Previous Signature: 09/28/2022 4:00:10 PM Version By: Gretta Cool, BSN, RN, CWS, [name] RN, BSN Entered By: Massie Kluver on 09/28/2022 16:15:51 -------------------------------------------------------------------------------- Pain Assessment Details Patient Name: Date of Service: Heather Hitch [name]. 09/28/2022 9:45 [name]: [id number] Patient Account Number: [id number] Date of Birth/Sex: Treating RN: Jun 25, 1976 (46 y.o. Orvan Falconer Primary Care Ein Rijo: Raelene Bott Other Clinician: Massie Kluver Referring Bertrum Helmstetter: Treating Leba Tibbitts/Extender: Eddie North in Treatment: 28 Active Problems Location of Pain Severity and Description of Pain Patient Has Paino No Site Locations Pain Management and Medication Current Pain Management: Electronic Signature(s) Signed: 09/28/2022 4:17:47 PM By: Carlene Coria RN Signed: 09/30/2022 10:21:17 AM By: Massie Kluver Entered By: Massie Kluver on 09/28/2022 10:46:23 [name] ([id number]) [id number] Page 7 of 10 -------------------------------------------------------------------------------- Patient/Caregiver Education Details Patient Name: Date of Service: TALANI, BRAZEE 12/6/2023andnbsp9:45 [name]: [id number] Patient Account Number: [id number] Date of Birth/Gender: Treating RN: June 05, 1976 (46 y.o. [name] Primary Care Physician: Raelene Bott Other Clinician: Massie Kluver Referring Physician: Treating Physician/Extender: Eddie North in Treatment: 28 Education Assessment Education Provided  To: Patient Education Topics Provided Wound Debridement: Handouts:  Wound Debridement Methods: Demonstration, Explain/Verbal Responses: State content correctly Wound/Skin Impairment: Handouts: Other: continue wound care as prescribed Methods: Explain/Verbal Responses: State content correctly Electronic Signature(s) Signed: 09/28/2022 4:35:11 PM By: Gretta Cool, BSN, RN, CWS, [name] RN, BSN Entered By: Gretta Cool, BSN, RN, CWS, [name] on 09/28/2022 15:47:43 -------------------------------------------------------------------------------- Wound Assessment Details Patient Name: Date of Service: Heather Hitch [name]. 09/28/2022 9:45 [name]: [id number] Patient Account Number: [id number] Date of Birth/Sex: Treating RN: [birth year] (46 y.o. Orvan Falconer Primary Care Wilbert Hayashi: Raelene Bott Other Clinician: Massie Kluver Referring Aprille Sawhney: Treating Taryn Nave/Extender: Linna Hoff Weeks in Treatment: 28 Wound Status Wound Number: 11 Primary Open Surgical Wound Etiology: Wound Location: Right, Plantar Foot Wound Open Wounding Event: Surgical Injury Status: Date Acquired: 12/01/2021 Comorbid Chronic sinus problems/congestion, Middle ear problems, Anemia, Weeks Of Treatment: 28 History: Chronic Obstructive Pulmonary Disease (COPD), Congestive Heart Clustered Wound: No Failure, Type II Diabetes, End Stage Renal Disease, History of pressure wounds, Neuropathy [name], [name] ([id number]) [id number] Page 8 of 10 Photos Wound Measurements Length: (cm) 0.4 Width: (cm) 0.4 Depth: (cm) 0.7 Area: (cm) 0.126 Volume: (cm) 0.088 % Reduction in Area: 93.2% % Reduction in Volume: 94.7% Epithelialization: Medium (34-66%) Tunneling: No Undermining: No Wound Description Classification: Full Thickness Without Exposed Suppo Wound Margin: Distinct, outline attached Exudate Amount: Medium Exudate Type: Serosanguineous Exudate Color: red,  brown rt Structures Foul Odor After Cleansing: No Slough/Fibrino Yes Wound Bed Granulation Amount: Medium (34-66%) Exposed Structure Granulation Quality: Pink, Pale Fascia Exposed: No Necrotic Amount: Medium (34-66%) Fat Layer (Subcutaneous Tissue) Exposed: Yes Tendon Exposed: No Muscle Exposed: No Joint Exposed: No Bone Exposed: No Treatment Notes Wound #11 (Foot) Wound Laterality: Plantar, Right Cleanser Dakin 16 (oz) 0.25 Discharge Instruction: Use as directed. Soap and Water Discharge Instruction: Gently cleanse wound with antibacterial soap, rinse and pat dry prior to dressing wounds Peri-Wound Care Topical keystone gel Primary Dressing Aquacel Extra Hydrofiber Dressing, 2x2 (in/in) Secondary Dressing ABD Pad 5x9 (in/in) Discharge Instruction: Cover with ABD pad Secured With Medipore T - 26M Medipore H Soft Cloth Surgical T ape ape, 2x2 (in/yd) Kerlix Roll Sterile or Non-Sterile 6-ply 4.5x4 (yd/yd) Discharge Instruction: Apply Kerlix as directed Compression Wrap Compression Stockings Add-Ons Electronic Signature(s) Signed: 09/28/2022 4:17:47 PM By: Carlene Coria RN Signed: 09/30/2022 10:21:17 AM By: Massie Kluver Entered By: Massie Kluver on 09/28/2022 10:47:03 [name] ([id number]) [id number] Page 9 of 10 -------------------------------------------------------------------------------- Wound Assessment Details Patient Name: Date of Service: [name], [name] 09/28/2022 9:45 [name]: [id number] Patient Account Number: [id number] Date of Birth/Sex: Treating RN: 03-23-1976 (46 y.o. Orvan Falconer Primary Care Ryley Teater: Raelene Bott Other Clinician: Massie Kluver Referring Makiyla Linch: Treating Ryen Heitmeyer/Extender: Linna Hoff Weeks in Treatment: 28 Wound Status Wound Number: 12 Primary Diabetic Wound/Ulcer of the Lower Extremity Etiology: Wound Location: Left, Medial, Plantar Foot Wound  Open Wounding Event: Pressure Injury Status: Date Acquired: 03/16/2020 Comorbid Chronic sinus problems/congestion, Middle ear problems, Anemia, Weeks Of Treatment: 28 History: Chronic Obstructive Pulmonary Disease (COPD), Congestive Heart Clustered Wound: No Failure, Type II Diabetes, End Stage Renal Disease, History of pressure wounds, Neuropathy Photos Wound Measurements Length: (cm) 2 Width: (cm) 1. Depth: (cm) 0. Area: (cm) 2 Volume: (cm) 0 % Reduction in Area: 43.3% 7 % Reduction in Volume: 71.7% 1 Epithelialization: Small (1-33%) .12 Tunneling: No .267 Undermining: No Wound Description Classification: Grade 3 Wound Margin: Flat and Intact Exudate Amount: Medium Exudate Type: Serous Exudate Color: amber Foul Odor After Cleansing: No Slough/Fibrino No Wound  Bed Granulation Amount: Large (67-100%) Exposed Structure Granulation Quality: Red, Pink Fascia Exposed: No Necrotic Amount: None Present (0%) Fat Layer (Subcutaneous Tissue) Exposed: Yes Tendon Exposed: No Muscle Exposed: No Joint Exposed: No Bone Exposed: No Treatment Notes Wound #12 (Foot) Wound Laterality: Plantar, Left, Medial Cleanser Bollig, Layce [name] ([id number]) [id number] Page 10 of 10 Dakin 16 (oz) 0.25 Discharge Instruction: Use as directed. Peri-Wound Care Topical Vitamin AandD Ointment, 4(oz) tube Discharge Instruction: apply to red, broken down area Primary Dressing Gauze Discharge Instruction: moisten with Dakins solution Secondary Dressing ABD Pad 5x9 (in/in) Discharge Instruction: Cover with ABD pad Secured With [place] H Soft Cloth Surgical T ape ape, 2x2 (in/yd) Kerlix Roll Sterile or Non-Sterile 6-ply 4.5x4 (yd/yd) Discharge Instruction: Apply Kerlix as directed Compression Wrap Compression Stockings Add-Ons Electronic Signature(s) Signed: 09/28/2022 4:17:47 PM By: Carlene Coria RN Signed: 09/30/2022 10:21:17 AM By: Massie Kluver Entered By: Massie Kluver on 09/28/2022 10:47:57 -------------------------------------------------------------------------------- Vitals Details Patient Name: Date of Service: [name], [name] 09/28/2022 9:45 [name]: [id number] Patient Account Number: [id number] Date of Birth/Sex: Treating RN: [birth year] (46 y.o. Orvan Falconer Primary Care Senai Ramnath: Raelene Bott Other Clinician: Massie Kluver Referring Zulay Corrie: Treating Win Guajardo/Extender: Eddie North in Treatment: 28 Vital Signs Time Taken: 10:31 Temperature (F): 98.3 Height (in): 69 Pulse (bpm): 64 Weight (lbs): 178 Respiratory Rate (breaths/min): 16 Body Mass Index (BMI): 26.3 Blood Pressure (mmHg): 132/84 Reference Range: 80 - 120 mg / dl Electronic Signature(s) Signed: 09/30/2022 10:21:17 AM By: Massie Kluver Entered By: Massie Kluver on 09/28/2022 10:35:02

## 2022-09-30 NOTE — Progress Notes (Signed)
RHIANNA, RAULERSON [name] ([id number]) [id number] Page 1 of 21 Visit Report for 09/28/2022 Chief Complaint Document Details Patient Name: Date of Service: [name], [name] 09/28/2022 9:45 [name]: [id number] Patient Account Number: [id number] Date of Birth/Sex: Treating RN: 1976/05/21 (45 y.o. Orvan Falconer Primary Care Provider: Raelene Bott Other Clinician: Massie Kluver Referring Provider: Treating Provider/Extender: Eddie North in Treatment: 28 Information Obtained from: Patient Chief Complaint 03/19/2021; patient referred by Dr. Luana Shu who has been looking after her left foot for quite [name] period of time for review of [name] nonhealing area in the left midfoot 03/12/2022; bilateral feet wounds and right lower extremity wound. Electronic Signature(s) Signed: 09/28/2022 12:06:37 PM By: Kalman Shan DO Entered By: Kalman Shan on 09/28/2022 11:14:31 -------------------------------------------------------------------------------- Cellular or Tissue Based Product Details Patient Name: Date of Service: Heather Hitch [name]. 09/28/2022 9:45 [name]: [id number] Patient Account Number: [id number] Date of Birth/Sex: Treating RN: June 21, 1976 (46 y.o. Orvan Falconer Primary Care Provider: Raelene Bott Other Clinician: Massie Kluver Referring Provider: Treating Provider/Extender: Eddie North in Treatment: 28 Cellular or Tissue Based Product Type Wound #12 Left,Medial,Plantar Foot Applied to: Performed By: Physician Kalman Shan, MD Cellular or Tissue Based Product Type: Apligraf Level of Consciousness (Pre-procedure): Awake and Alert Pre-procedure Verification/Time Out Yes - 11:03 Taken: Location: genitalia / hands / feet / multiple digits Wound Size (sq cm): 3.4 Product Size (sq cm): 44 Waste Size (sq cm): 35.2 Waste Reason: wound size Amount of Product Applied (sq cm):  8.8 Instrument Used: Blade, Forceps, Scissors Lot #: [id number] Order #: [id number] Expiration Date: 10/07/2022 [name], [name] ([id number]) [id number] Page 2 of 21 Fenestrated: Yes Instrument: Blade Reconstituted: Yes Solution Type: normal saline Solution Amount: 53ml Lot #: [id number] Solution Expiration Date: 01/28/2024 Secured: Yes Secured With: Steri-Strips Dressing Applied: Yes Primary Dressing: aquacel, ABD Response to Treatment: Procedure was tolerated well Level of Consciousness (Post- Awake and Alert procedure): Post Procedure Diagnosis Same as Pre-procedure Electronic Signature(s) Signed: 09/30/2022 10:21:17 AM By: Massie Kluver Entered By: Massie Kluver on 09/28/2022 16:21:51 -------------------------------------------------------------------------------- Debridement Details Patient Name: Date of Service: Heather Hitch [name]. 09/28/2022 9:45 [name]: [id number] Patient Account Number: [id number] Date of Birth/Sex: Treating RN: 04-14-1976 (46 y.o. Orvan Falconer Primary Care Provider: Raelene Bott Other Clinician: Massie Kluver Referring Provider: Treating Provider/Extender: Eddie North in Treatment: 28 Debridement Performed for Assessment: Wound #12 Left,Medial,Plantar Foot Performed By: Physician Kalman Shan, MD Debridement Type: Debridement Severity of Tissue Pre Debridement: Fat layer exposed Level of Consciousness (Pre-procedure): Awake and Alert Pre-procedure Verification/Time Out Yes - 10:52 Taken: Start Time: 10:52 T Area Debrided (L x W): otal 2.5 (cm) x 2 (cm) = 5 (cm) Tissue and other material debrided: Viable, Non-Viable, Callus, Slough, Subcutaneous, Slough Level: Skin/Subcutaneous Tissue Debridement Description: Excisional Instrument: Curette Bleeding: Minimum Hemostasis Achieved: Pressure Response to Treatment: Procedure was tolerated well Level of Consciousness (Post-  Awake and Alert procedure): Post Debridement Measurements of Total Wound Length: (cm) 2 Width: (cm) 1.7 Depth: (cm) 0.2 Volume: (cm) 0.534 Character of Wound/Ulcer Post Debridement: Stable Severity of Tissue Post Debridement: Fat layer exposed Post Procedure Diagnosis Same as Pre-procedure [name], [name] ([id number]) [id number] Page 3 of 21 Electronic Signature(s) Signed: 09/28/2022 12:06:37 PM By: Kalman Shan DO Signed: 09/28/2022 4:17:47 PM By: Carlene Coria RN Signed: 09/30/2022 10:21:17 AM By: Massie Kluver Entered By: Massie Kluver on 09/28/2022 10:58:36 -------------------------------------------------------------------------------- Debridement Details Patient Name: Date of Service: STA  [name], [name] 09/28/2022 9:45 [name]: [id number] Patient Account Number: [id number] Date of Birth/Sex: Treating RN: 19-Apr-1976 (46 y.o. Orvan Falconer Primary Care Provider: Raelene Bott Other Clinician: Massie Kluver Referring Provider: Treating Provider/Extender: Eddie North in Treatment: 28 Debridement Performed for Assessment: Wound #11 Right,Plantar Foot Performed By: Physician Kalman Shan, MD Debridement Type: Debridement Level of Consciousness (Pre-procedure): Awake and Alert Pre-procedure Verification/Time Out Yes - 10:58 Taken: Start Time: 10:58 T Area Debrided (L x W): otal 1 (cm) x 1 (cm) = 1 (cm) Tissue and other material debrided: Viable, Non-Viable, Callus, Slough, Subcutaneous, Slough Level: Skin/Subcutaneous Tissue Debridement Description: Excisional Instrument: Curette Bleeding: Minimum Hemostasis Achieved: Pressure Response to Treatment: Procedure was tolerated well Level of Consciousness (Post- Awake and Alert procedure): Post Debridement Measurements of Total Wound Length: (cm) 0.4 Width: (cm) 0.4 Depth: (cm) 0.7 Volume: (cm) 0.088 Character of Wound/Ulcer Post  Debridement: Stable Post Procedure Diagnosis Same as Pre-procedure Electronic Signature(s) Signed: 09/28/2022 12:06:37 PM By: Kalman Shan DO Signed: 09/28/2022 4:17:47 PM By: Carlene Coria RN Signed: 09/30/2022 10:21:17 AM By: Massie Kluver Entered By: Massie Kluver on 09/28/2022 10:59:17 [name] ([id number]) [id number] Page 4 of 21 -------------------------------------------------------------------------------- HPI Details Patient Name: Date of Service: [name], [name] 09/28/2022 9:45 [name]: [id number] Patient Account Number: [id number] Date of Birth/Sex: Treating RN: February 05, 1976 (46 y.o. Orvan Falconer Primary Care Provider: Raelene Bott Other Clinician: Massie Kluver Referring Provider: Treating Provider/Extender: Eddie North in Treatment: 28 History of Present Illness HPI Description: 01/18/18-She is here for initial evaluation of the left great toe ulcer. She is [name] poor historian in regards to timeframe in detail. She states approximately 4 weeks ago she lacerated her toe on something in the house. She followed up with her primary care who placed her on Bactrim and ultimately [name] second dose of Bactrim prior to coming to wound clinic. She states she has been treating the toe with peroxide, Betadine and [name] Band-Aid. She did not check her blood sugar this morning but checked it yesterday morning it was 327; she is unaware of [name] recent A1c and there are no current records. She saw Dr. she would've orthopedics last week for an old injury to the left ankle, she states he did not see her toe, nor did she bring it to his attention. She smokes approximately 1 pack cigarettes [name] day. Her social situation is concerning, she arrives this morning with her mother who appears extremely intoxicated/under the influence; her mother was asked to leave the room and be monitored by the patient's grandmother. The patient's  aunt then accompanied the patient and the room throughout the rest of the appointment. We had [name] lengthy discussion regarding the deleterious effects of uncontrolled hyperglycemia and smoking as it relates to wound healing and overall health. She was strongly encouraged to decrease her smoking and get her diabetes under better control. She states she is currently on [name] diet and has cut down her [facility] consumption. The left toe is erythematous, macerated and slightly edematous with malodor present. The edema in her left foot is below her baseline, there is no erythema streaking. We will treat her with Santyl, doxycycline; we have ordered and xray, culture and provided [name] Peg assist surgical shoe and cultured the wound. 01/25/18-She is here in follow-up evaluation for [name] left great toe ulcer and presents with an abscess to her suprapubic area. She states her blood sugars remain elevated, feeling "  sick" and if levels are below 250, but she is trying. She has made no attempt to decrease her smoking stating that we "can't take away her food in her cigarettes". She has been compliant with offloading using the PEG assist you. She is using Santyl daily. the culture obtained last week grew staph aureus and Enterococcus faecalis; continues on the doxycycline and Augmentin was added on Monday. The suprapubic area has erythema, no femoral variation, purple discoloration, minimal induration, was accessed with [name] cotton tip applicator with sanguinopurulent drainage, this was cultured, I suspect the current antibiotic treatment will cover and we will not add anything to her current treatment plan. She was advised to go to urgent care or ER with any change in redness, induration or fever. 02/01/18-She is here in follow-up evaluation for left great toe ulcers and [name] new abdominal abscess from last week. She was able to use packing until earlier this week, where she "forgot it was there". She states she was feeling ill  with GI symptoms last week and was not taking her antibiotic. She states her glucose levels have been predominantly less than 200, with occasional levels between 200-250. She thinks this was contributing to her GI symptoms as they have resolved without intervention. There continues to be significant laceration to left toe, otherwise it clinically looks stable/improved. There is now less superficial opening to the lateral aspect of the great toe that was residual blister. We will transition to [facility] to all wounds, she will continue her Augmentin. If there is no change or deterioration next week for reculture. 02/08/18-She is here in follow-up evaluation for left great toe ulcer and abdominal ulcer. There is an improvement in both wounds. She has been wrapping her left toe with coban, not by our direction, which has created an area of discoloration to the medial aspect; she has been advised to NOT use coban secondary to her neuropathy. She states her glucose levels have been high over this last week ranging from 200-350, she continues to smoke. She admits to being less compliant with her offloading shoe. We will continue with same treatment plan and she will follow-up next week. 02/15/18-She is here in follow-up evaluation for left great toe ulcer and abdominal ulcer. The abdominal ulcer is epithelialized. The left great toe ulcer is improved and all injury from last week using the Coban wrap is resolved, the lateral ulcer is healed. She admits to noncompliance with wearing offloading shoe and admits to glucose levels being greater than 300 most of the week. She continues to smoke and expresses no desire to quit. There is one area medially that probes deeper than it has historically, erythema to the toe and dorsal foot has consistently waxed and waned. There is no overt signs of cellulitis or infection but we will culture the wound for any occult infection given the new area of depth and erythema.  We will hold off on sensitivities for initiation of antibiotic therapy. 02/22/18-She is here in follow up evaluation for left great toe ulcer. There is overall significant improvement in both wound appearance, erythema and edema with changes made last week. She was not initiated on antibiotic therapy. Culture obtained last week showed oxacillin sensitive staph aureus, sensitive to clindamycin. Clindamycin has been called into the pharmacy but she has been instructed to hold off on initiation secondary to overall clinical improvement and her history of antibiotic intolerance. She has been instructed to contact the clinic with any noted changes/deterioration and the wound, erythema, edema  and/or pain. She will follow-up next week. She continues to smoke and her glucose levels remain elevated >250; she admits to compliance with offloading shoe 03/01/18 on evaluation today patient appears to be doing fairly well in regard to her left first toe ulcer. She has been tolerating the dressing changes with the [facility] Dressing without complication and overall this has definitely showed signs of improvement according to records as well is what the patient tells me today. I'm very pleased in that regard. She is having no pain today 03/08/18 She is here for follow up evaluation of [name] left great toe ulcer. She remains non-compliant with glucose control and smoking cessation; glucose levels consistently >200. She states that she got new shoe inserts/peg assist. She admits to compliance with offloading. Since my last evaluation there is significant improvement. We will switch to prisma at this time and she will follow up next week. She is noted to be tachycardic at this appointment, heart rate 120s; she has [name] history of heart rate 70-130 according to our records. She admits to extreme agitation r/t personal issues; she was advised to monitor her heartrate and contact her physician if it does not return to [name] more  normal range (<100). She takes cardizem twice daily. 03/15/18-She is here in follow-up evaluation for left great toe ulcer. She remains noncompliant with glucose control and smoking cessation. She admits to compliance with wearing offloading shoe. The ulcer is improved/stable and we will continue with the same treatment plan and she will follow-up next week 03/22/18-She is here for evaluation for left great toe ulcer. There continues to be significant improvement despite recurrent hyperglycemia (over 500 yesterday) and she continues to smoke. She has been compliant with offloading and we will continue with same treatment plan and she will follow-up next week. 03/29/18-She is here for evaluation for left great toe ulcer. Despite continuing to smoke and uncontrolled diabetes she continues to improve. She is compliant with offloading shoe. We will continue with the same treatment plan and she will follow-up next week 04/05/18- She is here in follow up evaluation for [name] left great toe ulcer; she presents with small pustule to left fifth toe (resembles ant bite). She admits to compliance with wearing offloading shoe; continues to smoke or have uncontrolled blood glucose control. There is more callus than usual with evidence of bleeding; she denies known trauma. 04/12/18-She is here for evaluation of left great toe ulcer. Despite noncompliance with glycemic control and smoking she continues to make improvement. She continues to wear offloading shoe. The pustule, that was identified last week, to the left fifth toe is resolved. She will follow-up in 2 weeks 05/03/18-she is seen in follow-up evaluation for [name] left great toe ulcer. She is compliant with offloading, otherwise noncompliant with glycemic control and smoking. She has plateaued and there is minimal improvement noted. We will transition to [facility], replaced the insert to her surgical shoe and she will follow-up in one week 05/10/18- She is here in  follow up evaluation for [name] left great toe ulcer. It appears stable despite measurement change. We will continue with same treatment plan and follow up next week. 05/24/18-She is seen in follow-up evaluation for [name] left great toe ulcer. She remains compliant with offloading, has made significant improvement in her diet, decreasing the amount of sugar/soda. She said her recent A1c was 10.9 which is lower than. She did see [name] diabetic nutritionist/educator yesterday. She continues to smoke. We will continue with the same treatment  plan and she'll follow-up next week. 05/31/18- She is seen in follow-up evaluation for left great toe ulcer. She continues to remain compliant with offloading, continues to make improvement in her diet, increasing her water and decreasing the amount of sugar/soda. She does continue to smoke with no desire to quit. We will apply Prisma to the depth and Hydrofera Blue over. We have not received insurance authorization for oasis. She will follow up next week. 06/07/18-She is seen in follow-up evaluation for left great toe ulcer. It has stalled according to today's measurements although base appears stable. She says she saw [name] diabetic educator yesterday; her average blood sugars are less than 300 which is an improvement for her. She continues to smoke and states "that's my next step" She continues with water over soda. We will order for xray, culture and reinstate ace wrap compression prior to placing apligraf for next week. She is voicing no complaints or concerns. Her dressing will change to iodoflex over the next week in preparation for apligraf. 06/14/18-She is seen in follow-up evaluation for left great toe ulcer. Plain film x-ray performed last week was negative for osteomyelitis. Wound culture obtained last week grew strep B and OSSA; she is initiated on keflex and cefdinir today; there is erythema to the toe which could be from ace wrap compression, she Dotter, Evalisse [name] ([id number])  [id number] Page 5 of 21 has [name] history of wrapping too tight and has has been encouraged to maintain ace wraps that we place today. We will hold off on application of apligraf today, will apply next week after antibiotic therapy has been initiated. She admits today that she has resumed taking [name] shower with her foot/toe submerged in water, she has been reminded to keep foot/toe out of the bath water. She will be seen in follow up next week 06/21/18-she is seen in follow-up evaluation for left great toe ulcer. She is tolerating antibiotic therapy with no GI disturbance. The wound is stable. Apligraf was applied today. She has been decreasing her smoking, only had 4 cigarettes yesterday and 1 today. She continues being more compliant in diabetic diet. She will follow-up next week for evaluation of site, if stable will remove at 2 weeks. 06/28/18- She is here in follow up evalution. Apligraf was placed last week, she states the dressing fell off on Tuesday and she was dressing with hydrofera blue. She is healed and will be discharged from the clinic today. She has been instructed to continue with smoking cessation, continue monitoring glucose levels, offloading for an additional 4 weeks and continue with hydrofera blue for additional two weeks for any possible microscopic opening. Readmission: 08/07/18 on evaluation today patient presents for reevaluation concerning the ulcer of her right great toe. She was previously discharged on 06/28/18 healed. Nonetheless she states that this began to show signs of drainage she subsequently went to her primary care provider. Subsequently an x-ray was performed on 08/01/18 which was negative. The patient was also placed on antibiotics at that time. Fortunately they should have been effective for the infection. Nonetheless she's been experiencing some improvement but still has [name] lot of drainage coming from the wound itself. 08/14/18 on evaluation  today patient's wound actually does show signs of improvement in regard to the erythema at this point. She has completed the antibiotics. With that being said we did discuss the possibility of placing her in [name] total contact cast as of today although I think that I may want to give this just [name]  little bit more time to ensure nothing recurrence as far as her infection is concerned. I do not want to put in the cast and risk infection at that time if things are not completely resolved. With that being said she is gonna require some debridement today. 08/21/18 on evaluation today patient actually appears to be doing okay in regard to her toe ulcer. She's been tolerating the dressing changes without complication. With that being said it does appear that she is ready and in fact I think it's appropriate for [place] to go ahead and initiate the total contact cast today. Nonetheless she will require some sharp debridement to prepare the wound for application. Overall I feel like things have been progressing well but we do need to do something to get this to close more readily. 08/24/18 patient seen today for reevaluation after having had the total contact cast applied on Tuesday. She seems to have done very well the wound appears to be doing great and overall I'm pleased with the progress that she's made. There were no abnormal areas of rubbing from the cast on her lower extremity. 08/30/18 on evaluation today patient actually appears to be completely healed in regard to her plantar toe ulcer. She tells me at this point she's been having [name] lot of issues with the cast. She almost fell [name] couple of times the state shall the step of her dog [name] couple times as well. This is been [name] very frustrating process for her other nonetheless she has completely healed the wound which is excellent news. Overall there does not appear to be the evidence of infection at this time which is great news. 09/11/18 evaluation today patient  presents for follow-up concerning her great toe ulcer on the left which has unfortunately reopened since I last saw her which was only [name] couple of weeks ago. Unfortunately she was not able to get in to get the shoe and potentially the AFO that's gonna be necessary due to her left foot drop. She continues with offloading shoe but this is not enough to prevent her from reopening it appears. When we last had her in the total contact cast she did well from [name] healing standpoint but unfortunately the wound reopened as soon as she came out of the cast within just [name] couple of weeks. 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 This is also part of the reason why she falls on [name] regular basis. I just do not believe that is gonna be safe for [place] to reinitiate the total contact cast as last time we had this on she fell 3 times one week which is definitely not normal for her. 09/18/18 upon evaluation today the patient actually appears to be doing about the same in regard to her toe ulcer. She did not contact [employer] as I asked her to even though I had given her the prescription. In fact she actually states that she has no idea where the prescription is. She did apparently call [employer] and they told her that all she needed to do was bring the prescription in order to be able to be seen and work on getting the AFO for her left foot. With all that being said she still does not have an appointment and I'm not  sure were things stand that regard. I will give her [name] new prescription today in order to contact them to get this set up. 09/25/18 on evaluation today patient actually appears to be doing about the same in regard to her toes ulcer. She does have [name] small areas which seems to have [name] lot of callous buildup around the edge of  the wound which is going to need sharp debridement today. She still is waiting to be scheduled for evaluation with [employer] for possibility of an AFO. She states there supposed to call her tomorrow to get this set up. Unfortunately it does appear that her foot specifically the toe area is showing signs of erythema. There does not appear to be any systemic infection which is in these good news. 10/02/18 on evaluation today patient actually appears to be doing about the same in regard to her toe ulcer. This really has not done too well although it's not significantly larger it's also not significantly smaller. She has been tolerating the dressing changes without complication. She actually has her appointment with [facility] tomorrow to hopefully be measured for obtaining and AFO splint. I think this would be helpful preventing this from reoccurring. We had contemplated starting the cast this week although to be honest I am reluctant to do that as she's been having nausea, vomiting, and seizure activity over the past three days. She has [name] history of seizures and have been told is nothing that can be done for these. With that being said I do believe that along with the seizures have the nausea vomiting which upon further questioning doesn't seem to be the normal for her and makes me concerned for the possibility of infection or something else going on. I discussed this with the patient and her mother during the office visit today. I do not feel the wound is effective but maybe something else. The responses this was "this just happens to her at times and we don't know why". They did not seem to be interested in going to the hospital to have this checked out further. 10/09/18 on evaluation today patient presents for follow-up concerning her ongoing toe ulcer. She has been tolerating the dressing changes without complication. Fortunately there does not appear to be any evidence of infection which is  great news however I do think that the patient would benefit from going ahead for with the total contact cast. She's actually in [name] wheelchair today she tells me that she will use her walker if we initiate the cast. I was very specific about the fact that if we were gonna do the cast I wanted to make sure that she was using the walker in order to prevent any falls. She tells me she does not have stairs that she has to traverse on [name] regular basis at her home. She has not had any seizures since last week again that something that happens to her often she tells me she did talk to [employer] and they said that it may take up to three weeks to get the brace approved for her. Hopefully that will not take that long but nonetheless in the meantime I do think the cast could be of benefit. 10/12/18 on evaluation today patient appears to be doing rather well in regard to her toe ulcer. It's just been [name] few days and already this is significantly improved both as far as overall appearance and size. Fortunately there's no sign of infection. She is here for her first  obligatory cast change. 10/19/18 Seen today for follow up and management of left great toe ulcer. Wound continues to show improvement. Noted small open area with seroussang drainage with palpation. Denies any increased pain or recent fevers during visit. She will continue calcium alginate with offloading shoe. Denies any questions or concerns during visit. 10/26/18 on evaluation today patient appears to be doing about the same as when I last saw her in regard to her wound bed. Fortunately there does not appear to be any signs of infection. Unfortunately she continues to have [name] breakdown in regard to the toe region any time that she is not in the cast. It takes almost no time at all for this to happen. Nonetheless she still has not heard anything from the brace being made by [employer] as to when exactly this will be available to her. Fortunately there is no signs  of infection at this time. 10/30/18 on evaluation today patient presents for application of the total contact cast as we just received him this morning. Fortunately we are gonna be able to apply this to her today which is great news. She continues to have no significant pain which is good news. Overall I do feel like things have been improving while she was the cast is when she doesn't have [name] cast that things get worse. She still has not really heard anything from [facility] regarding her brace. 11/02/18 upon evaluation today patient's wound already appears to be doing significantly better which is good news. Fortunately there does not appear to be any signs of infection also good news. Overall I do think the total contact cast as before is helping to heal this area unfortunately it's just not gonna likely keep the area closed and healed without her getting her brace at least. Again the foot drop is [name] significant issue for her. [name], [name] ([id number]) [id number] Page 6 of 21 11/09/18 on evaluation today patient appears to be doing excellent in regard to her toe ulcer which in fact is completely healed. Fortunately we finally got the situation squared away with the paperwork which was needed to proceed with getting her brace approved by Medicaid. I have filled that out unfortunately that information has been sent to the orthopedic office that I worked at 2 1/2 years ago and not tired Current wound care measures. Fortunately she seems to be doing very well at this time. 11/23/18 on evaluation today patient appears to be doing More Poorly Compared to Last Time I Saw Her. At [facility] She Had Completely Healed. Currently she is continuing to have issues with reopening. She states that she just found out that the brace was approved through Medicaid now she just has to go get measured in order to have this fitted for her and then made. Subsequently she does not have an appointment  for this yet that is going to complicate things we obviously cannot put her back in the cast if we do not have everything measured because they're not gonna be able to measure her foot while she is in the cast. 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 Obviously this is unfortunate and does have me somewhat worried as well. 11/30/18 on evaluation today patient's toe ulcer actually appears to be doing fairly well. The good news is she will be getting her brace in the shoes next week on Wednesday. Hopefully we  will be able to get this to heal without having to go back in the cast however she may need the cast in order to get the wound completely heal and then go from there. Fortunately there's no signs of infection at this time. 12/07/18 on evaluation today patient fortunately did receive her brace and she states she could tell this definitely makes her walk better. With that being said she's been having issues with her toe where she noticed yesterday there was [name] lot of tissue that was loosing off this appears to be much larger than what it was previous. She also states that her leg has been read putting much across the top of her foot just about the ankle although this seems to be receiving somewhat. The total area is still red and appears to be someone infected as best I can tell. She is previously taken Bactrim and that may be [name] good option for her today as well. We are gonna see what I wound culture shows as well and I think that this is definitely appropriate. With that being said outside of the culture I still need to initiate something in the interim and that's what I'm gonna go ahead and select Bactrim is [name] good option for her. 12/14/18 on evaluation today patient appears to be doing better in regard to her left great toe ulcer as compared to last week's evaluation. There's still some erythema  although this is significantly improved which is excellent news. Overall I do believe that she is making good progress is still gonna take some time before she is where I would like her to be from the standpoint of being able to place her back into the total contact cast. Hopefully we will be where we need to be by next week. 12/21/18 on evaluation today patient actually appears to be doing poorly in regard to her toe ulcer. She's been tolerating the dressing changes without complication. Fortunately there's no signs of systemic infection although she does have [name] lot of drainage from the toe ulcer and this does seem to be causing some issues at this point. She does have erythema on the distal portion of her toe that appears to be likely cellulitis. 12/28/18 on evaluation today patient actually appears to be doing [name] little better in my pinion in regard to her toe ulcer. With that being said she still does have some evidence of infection at this time and for her culture she had both E. coli as well as enterococcus as organisms noted on evaluation. For that reason I think that though the Keflex likely has treated the E. coli rather well this has really done nothing for the enterococcus. We are going to have to initiate treatment for this specifically. 01/04/19 on evaluation today patient's toe actually appears to be doing better from the standpoint of infection. She currently would like to see about putting the cash back on I think that this is appropriate as long as she takes care of it and keeps it from getting wet. She is gonna have some drainage we can definitely pass this up with Drawtex and alginate to try to prevent as much drainage as possible from causing the problems. With that being said I do want to at least try her with the cast between now and Tuesday. If there any issues we can't continue to use it then I will discontinue the use of the cast at that point. 01/08/19 on evaluation today patient  actually appears to be  doing very well as far as her foot ulcer specifically the great toe on the left is concerned. She did have an area of rubbing on the medial aspect of her left ankle which again is from the cast. Fortunately there's no signs of infection at this point in this appears to be [name] very slight skin breakdown. The patient tells me she felt it rubbing but didn't think it was that bad. Fortunately there is no signs of active infection at this time which is good news. No fevers, chills, nausea, or vomiting noted at this time. 01/15/19 on evaluation today patient actually appears to be doing well in regard to her toe ulcer. Again as previous she seems to do well and she has the cast on which indicates to me that during the time she doesn't have [name] cast on she's putting way too much pressure on this region. Obviously I think that's gonna be an issue as with the current national emergency concerning the Covid-19 Virus it has been recommended that we discontinue the use of total contact casting by the chief medical officer of our company, Dr. Simona Huh. The reasoning is that if [name] patient becomes sick and cannot come into have the cast removed they could not just leave this on for an additional two weeks. Obviously the hospitals also do not want to receive patient's who are sick into the emergency department to potentially contaminate the region and spread the Covid-19 Virus among other sick individuals within the hospital system. Therefore at this point we are suspending the use of total contact cast until the current emergency subsides. This was all discussed with the patient today as well. 01/22/19 on evaluation today patient's wound on her left great toe appears to be doing slightly worse than previously noted last week. She tells me that she has been on this quite [name] bit in fact she tells me she's been awake for 38 straight hours. This is due to the fact that she's having to care for grandparents  because nobody else will. She has been taking care of them for five the last seven days since I've seen her they both have dementia his is from [name] stroke and her grandmother's was progressive. Nonetheless she states even her mom who knows her condition and situation has only help two of those days to take care of them she's been taking care of the rest. Fortunately there does not appear to be any signs of active infection in regard to her toe at this point although obviously it doesn't look as good as it did previous. I think this is directly related to her not taking off the pressure and friction by way of taking things easy. Though I completely understand what's going on. 01/29/19 on evaluation today patient's tools are actually appears to be showing some signs of improvement today compared to last week's evaluation as far as not necessarily the overall size of the wound but the fact that she has some new skin growth in between the two ends of the wound opening. Overall I feel like she has done well she states that she had [name] family member give her what sounds to be [name] CAM walker boot which has been helpful as well. 02/05/19 on evaluation today patient's wound bed actually appears to be doing significantly better in regard to her overall appearance of the size of the wound. With that being said she is still having an issue with offloading efficiently enough to get this to close. Apparently  there is some signs of infection at this point as well unfortunately. Previously she's done well of Augmentin I really do not see anything that needs to be culture currently but there theme and cellulitis of the foot that I'm seeing I'm gonna go ahead and place her on an antibiotic today to try to help clear this up. 02/12/2019 on evaluation today patient actually appears to be doing poorly in regard to her overall wound status. She tells me she has been using her offloading shoe but actually comes in today wearing her  tennis shoe with the AFO brace. Again as I previously discussed with her this is really not sufficient to allow the area to heal appropriately. Nonetheless she continues to be somewhat noncompliant and I do wonder based on what she has told my nurse in the past as to whether or not [name] good portion of this noncompliance may be recreational drug and alcohol related. She has had [name] history of heroin overdose and this was fairly recently in the past couple of months that have been seeing her. Nonetheless overall I feel like her wound looks significantly worse today compared to what it was previous. She still has significant erythema despite the Augmentin I am not sure that this is an appropriate medication for her infection I am also concerned that the infection may have gone down into her bone. 02/19/19 on evaluation today patient actually appears to be doing about the same in regard to her toe ulcer. Unfortunately she continues to show signs of bone exposure and infection at this point. There does not appear to be any evidence of worsening of the infection but I'm also not really sure that it's getting significantly better. She is on the Augmentin which should be sufficient for the Staphylococcus aureus infection that she has at this point. With that being said she may need IV antibiotics to more appropriately treat this. We did have [name] discussion today about hyperbaric option therapy. 02/28/19 on evaluation today patient actually appears to be doing much worse in regard to the wound on her left great toe as compared to even my previous evaluation last week. Unfortunately this seems to be training in [name] pretty poor direction. Her toe was actually now starting to angle laterally and I can actually see the entire joint area of the proximal portion of the digit where is the distal portion of the digit again is no longer even in contact with the joint line. Unfortunately there's [name] lot more necrotic tissue around the  edge and the toe appears to be showing signs of becoming gangrenous in my pinion. I'm very concerned about were things stand at this point. She did see infectious disease and they are planning to send in [name] prescription for Sivextro for her and apparently this has been approved. With that being said I don't think she should avoid taking this but at the same time I'm not sure that it's gonna be sufficient [name], [name] ([id number]) [id number] Page 7 of 21 to save her toe at this point. She tells me that she still having to care for grandparents which I think is putting quite [name] bit of strain on her foot and specifically the total area and has caused this to break down even to [name] greater degree than would've otherwise been expected. 03/05/19 on evaluation today patient actually appears to be doing quite well in regard to her toe all things considering. She still has bone exposed but there appears to be much  less your thing on overall the appearance of the wound and the toe itself is dramatically improved. She still does have some issues currently obviously with infection she did see vascular as well and there concerned that her blood flow to the toad. For that reason they are setting up for an angiogram next week. 03/14/19 on evaluation today patient appears to be doing very poor in regard to her toe and specifically in regard to the ulceration and the fact that she's starting to notice the toe was leaning even more towards the lateral aspect and the complete joint is visible on the proximal aspect of the joint. Nonetheless she's also noted [name] significant odor and the tip of the toe is turning more dark and necrotic appearing. Overall I think she is getting worse not better as far as this is concerned. For that reason I am recommending at this point that she likely needs to be seen for likely amputation. READMISSION 03/19/2021 This is [name] patient that we cared for in this clinic  for [name] prolonged period of time in 2019 and 2020 with [name] left foot and left first toe wound. 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 Luana Shu. In December 2021 she had an ulcer on her right great toe as well as the fourth and fifth toes. 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 She also had an angiogram at that time and underwent angioplasty of the right anterior tibial artery. In any case she claims that the wound on the right foot is closed I did not look at this today which was probably an oversight although I think that should be done next week. 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 According to Dr. Deborra Medina last review that she was out of the area being cared for by another physician but recently came back to his attention. 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 [name] culture of this area showed E. coli apparently before she came back to see Dr. Luana Shu she was supposed to be receiving antibiotics but she did not really take them. Nor is she offloading this area at all. Finally her last hemoglobin A1c listed in epic was in March 2022 at 14.1 she says things are [name] lot better since then although I am not sure. 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 She had this wound at the time. During this admission she had E. coli sepsis [name] TEE was negative. 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 She is still [name] heavy cigarette smoker. She has [name] history of  bipolar disorder seizure disorder ABI in our clinic was 1.05 6/1; left midfoot in the setting of [name] TMA done previously. Round circular wound with [name] "knuckle" of protruding tissue. The problem is that the knuckle was not attached to any of the surrounding granulation and this probed proximally widely I removed [name] large portion of this tissue. This wound goes with considerable undermining laterally. I do not feel any bone there was no purulence but this is  [name] deep wound. 6/8; in spite of the debridement I did last week. She arrives with [name] wound looking exactly the same. [name] protruding "knuckle" of tissue nonadherent to most of the surrounding tissue. There is considerable depth around this from 6-12 o'clock at 2.7 cm and undermining of 1 cm. This does not look overtly infected and the x- ray I did last week was negative for any osseous abnormalities. We have been using silver collagen 6/15; deep tissue culture I did last week showed moderate staph aureus and moderate Pseudomonas. This will definitely require prolonged antibiotic therapy. The pathology on the protuberant area was negative for malignancy fungus etc. 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 We have been using silver collagen. I am going to be prescribing Levaquin for [address] scan of the foot is down for 7/5 6/22; CT scan of the foot on 7 5. She says she has hardware in the left leg from her previous fracture. She is on the Levaquin for the deep tissue culture I did that showed methicillin sensitive staph aureus and Pseudomonas. I gave her [name] 2-week supply and she will have another week. She arrives in clinic today with the same protuberant tissue however this is nonadherent to the tissue surrounding it. I am really at [name] loss to explain this unless there is underlying deep tissue infection 6/29; patient presents for 1 week follow-up. She has been using collagen to the wound  bed. She reports taking her antibiotics as prescribed.She has no complaints or issues today. She denies signs of infection. 7/6; patient presents for one week followup. She has been using collagen to the wound bed. She states she is taking Levaquin however at times she is not able to keep it down. She denies signs of infection. 7/13; patient presents for 1 week follow-up. She has been using silver alginate to the wound bed. She still has nausea when taking her antibiotics. She denies signs of infection. 7/20; patient presents for 1 week follow-up. She has been using silver alginate with gentamicin cream to the wound bed. She denies any issues and has no complaints today. She denies signs of infection. 7/27; patient presents for 1 week follow-up. She continues to use silver alginate with gentamicin cream to the wound bed. She reports starting her antibiotics. She has no issues or complaints. Overall she reports stability to the wound. 8/3; patient presents for 1 week follow-up. She has been using silver alginate with gentamicin cream to the wound bed. She reports completing all antibiotics. She has no issues or complaints today. She denies signs of infection. 8/17; patient presents for 2-week follow-up. He is to use silver alginate to the wound bed. She has no issues or complaints today. She denies signs of infection. She reports her pain has improved in her foot since last clinic visit 8/24; patient presents for 1 week follow-up. She continues to use silver alginate to the wound bed. She has no issues or complaints. She denies signs of infection. Pain is stable. 9/7; patient presents for follow-up. She missed her last week appointment due to feeling ill. She continues to use silver alginate. She has [name] new wound to the right lower extremity that is covered in eschar. She states It occurred over the past week and has no idea how it started. She currently denies signs of infection. 9/14; patient  presents for follow-up. T the left foot wound she has been using gentamicin cream and silver  alginate. T the right lower extremity wound she has o o been keeping this covered and has not obtain Santyl. 9/21; patient presents for follow-up. She reports using gentamicin cream and silver alginate to the left foot and Santyl to the right lower extremity wound. She has no issues or complaints today. She denies signs of infection. 9/28; patient presents for follow-up. She reports [name] new wound to her right heel. She states this occurred [name] few days ago and is progressively gotten worse. She has been trying to clean the area with [name] Q-tip and Santyl. She reports stability in the other 2 wounds. She has been using gentamicin cream and silver alginate to the left foot and Santyl to the right lower extremity wound. 10/12; patient presents for follow-up. She reports improvement to the wound beds. She is seeing vein and vascular to discuss the potential of [name] left BKA. She states they are going to do an arteriogram. She continues to use silver alginate with dressing changes to her wounds. 11/2; patient presents for follow-up. She states she has not been doing dressing changes to the wound beds. She states she is not able to offload the areas. She reports chronic pain to her left foot wound. 11/9; patient presents for follow-up. She came in with only socks on. She states she forgot to put on shoes. It is unclear if she is doing any dressing changes. She currently denies systemic signs of infection. 11/16; patient presents for follow-up. She came again only with socks on. She states she does not wear shoes ever. It is unclear if she does dressing changes. She currently denies systemic signs of infection. 11/23; patient presents for follow-up. She wore her shoes today. It still unclear exactly what dressing she is using for each wound but she did states she obtained Dakin's solution and has been using this to the left  foot wound. She currently denies signs of infection. 11/30; patient presents for follow-up. She has no issues or complaints today. She currently denies signs of infection. KALANDRA, MASTERS [name] ([id number]) [id number] Page 8 of 21 12/7; patient presents for follow-up. She has no issues or complaints today. She has been using Hydrofera Blue to the right heel wound and Dakin solution to the left foot wound. Her right anterior leg wound is healed. She currently denies signs of infection. 12/14; patient presents for follow-up. She has been using Hydrofera Blue to the right heel and Dakin's to the left foot wounds. She has no issues or complaints today. She denies signs of infection. 12/21; patient presents for follow-up. She reports using Hydrofera Blue to the right heel and Dakin's to the left foot wound. She denies signs of infection. 12/28; patient presents for follow-up. She continues to use Dakin's to the left foot wound and Hydrofera Blue to the right heel wound. She denies signs of infection. 1/4; patient presents for follow-up. She has no issues or complaints today. She denies signs of infection. 1/11; patient presents for follow-up. It is unclear if she has been dressing these wounds over the past week. She currently denies signs of infection. 1/18; patient presents for follow-up. She states she has been using Dakin's wet-to-dry dressings to the left foot. She has been using Hydrofera Blue to the right foot foot wound. She states that the anterior right leg wound has reopened and draining serous fluid. She denies signs of infection. 1/25; patient presents for follow-up. She has no issues or complaints today. 2/1; patient presents for follow-up. She has no issues  or complaints today. She denies signs of infection. 2/8; patient presents for follow-up. She has lost her surgical shoes. She did not have [name] dressing to the right heel wound. She currently denies signs  of infection. 2/15; patient presents for follow-up. She reports more pain to the right heel today. She denies purulent drainage Or fever/chills 2/22; patient presents for follow-up. She reports taking clindamycin over the past week. She states that she continues to have pain to her right heel. She reports purulent drainage. Readmission 03/16/2022 Ms. Teneisha Gignac is [name] 46 year old female with [name] past medical history of type 2 diabetes, osteomyelitis to her feet, chronic systolic heart failure and bipolar disorder that presents to the clinic for bilateral feet wounds and right lower extremity wound. She was last seen in our clinic on 12/15/2021. At that time she had purulent drainage coming out of her right plantar foot and I recommended she go to the ED. She states she went to [facility] and has been there for the past 3 months. I cannot see the records. She states she had OR debridement and was on several weeks of IV antibiotics while inpatient. Since discharge she has not been taking care of the wound beds. She had nothing on her feet other than socks today. She currently denies signs of infection. 5/31; patient presents for follow-up. She has been using Dakin's wet-to-dry dressings to the wound beds on her feet bilaterally and antibiotic ointment to the right anterior leg wound. She had [name] wound culture done at last clinic visit that showed moderate Pseudomonas aeruginosa sensitive to ciprofloxacin. She currently denies systemic signs of infection. 6/14; patient presents for follow-up. She received Keystone 5 days ago and has been using this on the wound beds. She states that last week she had to go to the hospital because she had increased warmth and erythema to the right foot. She was started on 2 oral antibiotics. She states she has been taking these. She currently denies systemic signs of infection. She has no issues or complaints today. 6/21; patient presents for follow-up. She  states she has been using Keystone antibiotics to the wound beds. She has no issues or complaints today. She denies signs of infection. 6/28; patient presents for follow-up. She has been using Keystone antibiotics to the wound beds. She has no issues or complaints today. 7/12; patient presents for follow-up. Has been using Keystone antibiotics to the wound beds with calcium alginate. She has no issues or complaints today. She never followed up with her orthopedic surgeon who did the OR debridement to the right foot. We discussed the total contact cast for the left foot and patient would like to do this next week. 7/19; patient presents for follow-up. She has been using Keystone antibiotics with calcium alginate to the wound beds. She has no issues or complaints today. Patient is in agreement to do the total contact cast of the left foot today. She knows to return later this week for the obligatory cast change. 05-13-2022 upon evaluation today patient's wound which she has the cast of the left leg actually appears to be doing significantly better. Fortunately I do not see any signs of active infection locally or systemically which is great news and overall I am extremely pleased with where we stand currently. 7/26; patient presents for follow-up. She has [name] cast in place for the past week. She states it irritated her shin. Other than that she tolerated the cast well. She states she would like [name]  break for 1 week from the cast. We have been using Keystone antibiotic and Aquacel to both wound beds. She denies signs of infection. 8/2; patient presents for follow-up. She has been using Keystone and Aquacel to the wound beds. She denies any issues and has no complaints. She is agreeable to have the cast placed today for the left leg. 06-03-2022 upon evaluation today patient appears to be doing well with regard to her wound she saw some signs of improvement which is great news. Fortunately I do not see any  evidence of active infection locally or systemically at this time which is great news. No fevers, chills, nausea, vomiting, or diarrhea. 8/16; patient presents for follow-up. She has no issues or complaints today. We have been using Keystone and Aquacel to the wound beds. The left lower extremity is in [name] total contact cast. She is tolerated this well. 8/23; patient presents for follow-up. She has had the total contact cast on the left leg for the past week. Unfortunately this has rubbed and broken down the skin to the medial foot. She currently denies signs of infection. She has been using Keystone antibiotic to the right plantar foot wound. 8/30; patient presents for follow-up. We have held off on the total contact cast for the left leg for the past week. Her wound on the left foot has improved and the previous surrounding breakdown of skin has epithelialized. She has been using Keystone antibiotic to both wound beds. She has no issues or complaints today. She denies signs of infection. 9/6; patient presents for follow-up. She has ordered her's Keystone antibiotic and this is arriving this week. She has been doing Dakin's wet-to-dry dressings to the wound beds. She denies signs of infection. She is agreeable to the total contact cast today. 9/13; patient presents for follow-up. She states that the cast caused her left leg shin to be sore. She would like to take [name] break from the cast this week. She has been using Keystone antibiotic to the right plantar foot wound. She denies signs of infection. 9/20; patient presents for follow-up. She has been using Keystone antibiotics to the wound beds with calcium alginate to the right foot wound and Hydrofera Blue to the left foot wound. She is agreeable to having the cast placed today. She has been approved for Apligraf and we will order this for next clinic visit. 9/27; patient presents for follow-up. We have been using Keystone antibiotic with Hydrofera Blue  to the left foot wound under [name] total contact cast. T the right o Hugh, Trenell [name] ([id number]) [id number] Page 9 of 21 foot wound she has been using Keystone antibiotic and calcium alginate. She declines [name] total contact cast today. Apligraf is available for placement and she would like to proceed with this. 07-28-2022 upon evaluation today patient appears to be doing well currently in regard to her wound. She is actually showing signs of significant improvement which is great news. Fortunately I do not see any evidence of active infection locally nor systemically at this time. She has been seeing Dr. [facility] [place] and to be honest has been doing very well with the cast. Subsequently she comes in today with [name] cast on and we did reapply that today as well. She did not really want to she try to talk me out of that but I explained that if she wanted to heal this is really the right way to go. Patient voiced understanding. In regard to her right foot this is actually [name]  lot better compared to the last time I saw her which is also great news. 10/11; patient presents for follow-up. Apligraf and the total contact cast was placed to the left leg at last clinic visit. She states that her right foot wound had burning pain to it with the placement of Apligraf to this area. She has been doing [place] over this area. She denies signs of infection including increased warmth, erythema or purulent drainage. 11/1; 3-week follow-up. The patient fortunately did not have [name] total contact cast or an Apligraf and on the left foot. She has been using Keystone ABD pads and kerlix and her own running shoes She arrives in clinic today with thick callus and [name] very poor surface on the left foot on the right nonviable skin subcutaneous tissue and [name] deep probing hole. 11/15; patient missed her last clinic appointment. She states she has not been dressing the wound beds for the past 2 weeks. She states that at  she had [name] new roommate but is now going back to live with her mother. Apparently its been [name] distracting 2 weeks. Patient currently denies signs of infection. 11/22; patient presents for follow-up. She states she has been using Keystone antibiotic and Dakin's wet-to-dry dressings to the wound beds. She is agreeable for cast placement today. We had ordered Apligraf however this has not been received by our facility. 11/29; Patient had [name] total contact cast placed at last clinic visit and she tolerated this well. We were using silver alginate under the cast. Patient's been using Keystone antibiotic with Aquacel to the right plantar foot wound. She has no issues or complaints today. Apligraf is available for placement today. Patient would like to proceed with this. 12/6; patient presents for follow-up. She had Apligraf placed in standard fashion last clinic visit under the total contact cast to the left lower extremity. She has been using Keystone antibiotic and Aquacel to the right plantar foot wound. She has no issues or complaints today. Electronic Signature(s) Signed: 09/28/2022 12:06:37 PM By: Kalman Shan DO Entered By: Kalman Shan on 09/28/2022 11:20:09 -------------------------------------------------------------------------------- Physical Exam Details Patient Name: Date of Service: Michel Harrow 09/28/2022 9:45 [name]: [id number] Patient Account Number: [id number] Date of Birth/Sex: Treating RN: 1976-03-10 (46 y.o. Orvan Falconer Primary Care Provider: Raelene Bott Other Clinician: Massie Kluver Referring Provider: Treating Provider/Extender: Eddie North in Treatment: 28 Constitutional . Cardiovascular . Psychiatric . Notes Right foot: T the plantar heel there is an incision site with increased depth.T the opening there is granulation tissue. Left foot: T the medial aspect there is an o o o open wound with granulation  tissue and nonviable tissue with surrounding callus. No overt signs of infection to any of the wound beds. Electronic Signature(s) Signed: 09/28/2022 12:06:37 PM By: Kalman Shan DO Entered By: Kalman Shan on 09/28/2022 11:20:30 [name] ([id number]) [id number] Page 10 of 21 -------------------------------------------------------------------------------- Physician Orders Details Patient Name: Date of Service: LEIGHLA, CHESTNUTT 09/28/2022 9:45 [name]: [id number] Patient Account Number: [id number] Date of Birth/Sex: Treating RN: [birth year] (46 y.o. Orvan Falconer Primary Care Provider: Raelene Bott Other Clinician: Massie Kluver Referring Provider: Treating Provider/Extender: Eddie North in Treatment: 63 Verbal / Phone Orders: No Diagnosis Coding Follow-up Appointments Return Appointment in 1 week. Nurse Visit as needed Bathing/ Shower/ Hygiene May shower with wound dressing protected with water repellent cover or cast protector. No tub bath. Anesthetic (Use 'Patient Medications'  Section for Anesthetic Order Entry) Lidocaine applied to wound bed Cellular or Tissue Based Products Cellular or Tissue Based Product Type: - Apligraf #5 applied left foot Edema Control - Lymphedema / Segmental Compressive Device / Other Elevate, Exercise Daily and [name] void Standing for Long Periods of Time. Elevate legs to the level of the heart and pump ankles as often as possible Elevate leg(s) parallel to the floor when sitting. Off-Loading Total Contact Cast to Left Lower Extremity Total Contact Cast to Right Lower Extremity - applied to left foot Open toe surgical shoe - Heel off loader - Right foot Other: - keep pressure off of feet. Medications-Please add to medication list. Wound #12 Left,Medial,Plantar Foot Keystone Compound - Keystone gel right foot Wound Treatment Wound #11 - Foot Wound Laterality:  Plantar, Right Cleanser: Dakin 16 (oz) 0.25 1 x Per Day/30 Days Discharge Instructions: Use as directed. Cleanser: Soap and Water 1 x Per Day/30 Days Discharge Instructions: Gently cleanse wound with antibacterial soap, rinse and pat dry prior to dressing wounds Topical: keystone gel 1 x Per Day/30 Days Prim Dressing: Aquacel Extra Hydrofiber Dressing, 2x2 (in/in) ary 1 x Per Day/30 Days Secondary Dressing: ABD Pad 5x9 (in/in) (Generic) 1 x Per Day/30 Days Discharge Instructions: Cover with ABD pad Secured With: Medipore T - 21M Medipore H Soft Cloth Surgical T ape ape, 2x2 (in/yd) (Generic) 1 x Per Day/30 Days Secured With: Kerlix Roll Sterile or Non-Sterile 6-ply 4.5x4 (yd/yd) (Generic) 1 x Per Day/30 Days Discharge Instructions: Apply Kerlix as directed Wound #12 - Foot Wound Laterality: Plantar, Left, Medial Cleanser: Dakin 16 (oz) 0.25 (Dispense As Written) 1 x Per Day/30 Days Discharge Instructions: Use as directed. ABYGAIL, GALENO [name] ([id number]) [id number] Page 11 of 21 Topical: Vitamin AandD Ointment, 4(oz) tube 1 x Per Day/30 Days Discharge Instructions: apply to red, broken down area Prim Dressing: Gauze 1 x Per Day/30 Days ary Discharge Instructions: moisten with Dakins solution Secondary Dressing: ABD Pad 5x9 (in/in) (Generic) 1 x Per Day/30 Days Discharge Instructions: Cover with ABD pad Secured With: Medipore T - 21M Medipore H Soft Cloth Surgical T ape ape, 2x2 (in/yd) (Generic) 1 x Per Day/30 Days Secured With: Kerlix Roll Sterile or Non-Sterile 6-ply 4.5x4 (yd/yd) (Generic) 1 x Per Day/30 Days Discharge Instructions: Apply Kerlix as directed Electronic Signature(s) Signed: 09/28/2022 12:06:37 PM By: Kalman Shan DO Entered By: Kalman Shan on 09/28/2022 12:01:53 -------------------------------------------------------------------------------- Problem List Details Patient Name: Date of Service: Heather Hitch [name]. 09/28/2022 9:45 [name]  M Medical Record Number: [id number] Patient Account Number: [id number] Date of Birth/Sex: Treating RN: [birth year] (46 y.o. 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 03/16/2022 No Yes E11.621 Type 2 diabetes mellitus with foot ulcer 03/16/2022 No Yes E11.42 Type 2 diabetes mellitus with diabetic polyneuropathy 03/16/2022 No Yes L97.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 03/16/2022 No Yes of skin Inactive Problems Resolved Problems ERNEST, ORR [name] ([id number]) [id number] Page 12 of 21 Electronic Signature(s) Signed: 09/28/2022 12:06:37 PM By: Kalman Shan DO Entered By: Kalman Shan on 09/28/2022 11:14:25 -------------------------------------------------------------------------------- Progress Note Details Patient Name: Date of Service: [name], Rabab [name]. 09/28/2022 9:45 [name]  Medical Record Number: [id number] Patient Account Number: [id number] Date of Birth/Sex: Treating RN: [birth year] (46 y.o. Orvan Falconer Primary Care Provider: Raelene Bott Other Clinician: Massie Kluver Referring Provider: Treating Provider/Extender: Eddie North in Treatment: 28 Subjective Chief Complaint Information obtained from Patient 03/19/2021; patient referred by Dr. Luana Shu who has been looking after her left foot for quite [name] period of time for review of [name] nonhealing area in the left midfoot 03/12/2022; bilateral feet wounds and right lower extremity wound. History of Present Illness  (HPI) 01/18/18-She is here for initial evaluation of the left great toe ulcer. She is [name] poor historian in regards to timeframe in detail. She states approximately 4 weeks ago she lacerated her toe on something in the house. She followed up with her primary care who placed her on Bactrim and ultimately [name] second dose of Bactrim prior to coming to wound clinic. She states she has been treating the toe with peroxide, Betadine and [name] Band-Aid. She did not check her blood sugar this morning but checked it yesterday morning it was 327; she is unaware of [name] recent A1c and there are no current records. She saw Dr. she would've orthopedics last week for an old injury to the left ankle, she states he did not see her toe, nor did she bring it to his attention. She smokes approximately 1 pack cigarettes [name] day. Her social situation is concerning, she arrives this morning with her mother who appears extremely intoxicated/under the influence; her mother was asked to leave the room and be monitored by the patient's grandmother. The patient's aunt then accompanied the patient and the room throughout the rest of the appointment. We had [name] lengthy discussion regarding the deleterious effects of uncontrolled hyperglycemia and smoking as it relates to wound healing and overall health. She was strongly encouraged to decrease her smoking and get her diabetes under better control. She states she is currently on [name] diet and has cut down her [facility] consumption. The left toe is erythematous, macerated and slightly edematous with malodor present. The edema in her left foot is below her baseline, there is no erythema streaking. We will treat her with Santyl, doxycycline; we have ordered and xray, culture and provided [name] Peg assist surgical shoe and cultured the wound. 01/25/18-She is here in follow-up evaluation for [name] left great toe ulcer and presents with an abscess to her suprapubic area. She states her blood sugars  remain elevated, feeling "sick" and if levels are below 250, but she is trying. She has made no attempt to decrease her smoking stating that we "can't take away her food in her cigarettes". She has been compliant with offloading using the PEG assist you. She is using Santyl daily. the culture obtained last week grew staph aureus and Enterococcus faecalis; continues on the doxycycline and Augmentin was added on Monday. The suprapubic area has erythema, no femoral variation, purple discoloration, minimal induration, was accessed with [name] cotton tip applicator with sanguinopurulent drainage, this was cultured, I suspect the current antibiotic treatment will cover and we will not add anything to her current treatment plan. She was advised to go to urgent care or ER with any change in redness, induration or fever. 02/01/18-She is here in follow-up evaluation for left great toe ulcers and [name] new abdominal abscess from last week. She was able to use packing until earlier this week, where she "forgot it was there". She states she was feeling ill with GI symptoms last week and  was not taking her antibiotic. She states her glucose levels have been predominantly less than 200, with occasional levels between 200-250. She thinks this was contributing to her GI symptoms as they have resolved without intervention. There continues to be significant laceration to left toe, otherwise it clinically looks stable/improved. There is now less superficial opening to the lateral aspect of the great toe that was residual blister. We will transition to [facility] to all wounds, she will continue her Augmentin. If there is no change or deterioration next week for reculture. 02/08/18-She is here in follow-up evaluation for left great toe ulcer and abdominal ulcer. There is an improvement in both wounds. She has been wrapping her left toe with coban, not by our direction, which has created an area of discoloration to the medial  aspect; she has been advised to NOT use coban secondary to her neuropathy. She states her glucose levels have been high over this last week ranging from 200-350, she continues to smoke. She admits to being less compliant with her offloading shoe. We will continue with same treatment plan and she will follow-up next week. 02/15/18-She is here in follow-up evaluation for left great toe ulcer and abdominal ulcer. The abdominal ulcer is epithelialized. The left great toe ulcer is improved and all injury from last week using the Coban wrap is resolved, the lateral ulcer is healed. She admits to noncompliance with wearing offloading shoe and admits to glucose levels being greater than 300 most of the week. She continues to smoke and expresses no desire to quit. There is one area medially that probes deeper than it has historically, erythema to the toe and dorsal foot has consistently waxed and waned. There is no overt signs of cellulitis or infection but we will culture the wound for any occult infection given the new area of depth and erythema. We will hold off on sensitivities for initiation of antibiotic therapy. 02/22/18-She is here in follow up evaluation for left great toe ulcer. There is overall significant improvement in both wound appearance, erythema and edema with changes made last week. She was not initiated on antibiotic therapy. Culture obtained last week showed oxacillin sensitive staph aureus, sensitive to clindamycin. Clindamycin has been called into the pharmacy but she has been instructed to hold off on initiation secondary to overall clinical improvement and her history of antibiotic intolerance. She has been instructed to contact the clinic with any noted changes/deterioration and the wound, erythema, edema and/or pain. She will follow-up next week. She continues to smoke and her glucose levels remain elevated >250; she admits to compliance with offloading shoe 03/01/18 on evaluation today  patient appears to be doing fairly well in regard to her left first toe ulcer. She has been tolerating the dressing changes with the [facility] Dressing without complication and overall this has definitely showed signs of improvement according to records as well is what the patient tells me today. I'm very pleased in that regard. She is having no pain today 03/08/18 She is here for follow up evaluation of [name] left great toe ulcer. She remains non-compliant with glucose control and smoking cessation; glucose levels consistently >200. She states that she got new shoe inserts/peg assist. She admits to compliance with offloading. Since my last evaluation there is significant improvement. We will switch to prisma at this time and she will follow up next week. She is noted to be tachycardic at this appointment, heart rate 120s; she has [name] history of heart rate 70-130 according  to our records. She admits to extreme agitation r/t personal issues; she was advised to monitor her heartrate and contact her physician if it does not return to [name] more normal range (<100). She takes cardizem twice daily. [name], [name] ([id number]) [id number] Page 13 of 21 03/15/18-She is here in follow-up evaluation for left great toe ulcer. She remains noncompliant with glucose control and smoking cessation. She admits to compliance with wearing offloading shoe. The ulcer is improved/stable and we will continue with the same treatment plan and she will follow-up next week 03/22/18-She is here for evaluation for left great toe ulcer. There continues to be significant improvement despite recurrent hyperglycemia (over 500 yesterday) and she continues to smoke. She has been compliant with offloading and we will continue with same treatment plan and she will follow-up next week. 03/29/18-She is here for evaluation for left great toe ulcer. Despite continuing to smoke and uncontrolled diabetes she continues to  improve. She is compliant with offloading shoe. We will continue with the same treatment plan and she will follow-up next week 04/05/18- She is here in follow up evaluation for [name] left great toe ulcer; she presents with small pustule to left fifth toe (resembles ant bite). She admits to compliance with wearing offloading shoe; continues to smoke or have uncontrolled blood glucose control. There is more callus than usual with evidence of bleeding; she denies known trauma. 04/12/18-She is here for evaluation of left great toe ulcer. Despite noncompliance with glycemic control and smoking she continues to make improvement. She continues to wear offloading shoe. The pustule, that was identified last week, to the left fifth toe is resolved. She will follow-up in 2 weeks 05/03/18-she is seen in follow-up evaluation for [name] left great toe ulcer. She is compliant with offloading, otherwise noncompliant with glycemic control and smoking. She has plateaued and there is minimal improvement noted. We will transition to [facility], replaced the insert to her surgical shoe and she will follow-up in one week 05/10/18- She is here in follow up evaluation for [name] left great toe ulcer. It appears stable despite measurement change. We will continue with same treatment plan and follow up next week. 05/24/18-She is seen in follow-up evaluation for [name] left great toe ulcer. She remains compliant with offloading, has made significant improvement in her diet, decreasing the amount of sugar/soda. She said her recent A1c was 10.9 which is lower than. She did see [name] diabetic nutritionist/educator yesterday. She continues to smoke. We will continue with the same treatment plan and she'll follow-up next week. 05/31/18- She is seen in follow-up evaluation for left great toe ulcer. She continues to remain compliant with offloading, continues to make improvement in her diet, increasing her water and decreasing the amount of sugar/soda. She  does continue to smoke with no desire to quit. We will apply Prisma to the depth and Hydrofera Blue over. We have not received insurance authorization for oasis. She will follow up next week. 06/07/18-She is seen in follow-up evaluation for left great toe ulcer. It has stalled according to today's measurements although base appears stable. She says she saw [name] diabetic educator yesterday; her average blood sugars are less than 300 which is an improvement for her. She continues to smoke and states "that's my next step" She continues with water over soda. We will order for xray, culture and reinstate ace wrap compression prior to placing apligraf for next week. She is voicing no complaints or concerns. Her dressing will change to iodoflex over  the next week in preparation for apligraf. 06/14/18-She is seen in follow-up evaluation for left great toe ulcer. Plain film x-ray performed last week was negative for osteomyelitis. Wound culture obtained last week grew strep B and OSSA; she is initiated on keflex and cefdinir today; there is erythema to the toe which could be from ace wrap compression, she has [name] history of wrapping too tight and has has been encouraged to maintain ace wraps that we place today. We will hold off on application of apligraf today, will apply next week after antibiotic therapy has been initiated. She admits today that she has resumed taking [name] shower with her foot/toe submerged in water, she has been reminded to keep foot/toe out of the bath water. She will be seen in follow up next week 06/21/18-she is seen in follow-up evaluation for left great toe ulcer. She is tolerating antibiotic therapy with no GI disturbance. The wound is stable. Apligraf was applied today. She has been decreasing her smoking, only had 4 cigarettes yesterday and 1 today. She continues being more compliant in diabetic diet. She will follow-up next week for evaluation of site, if stable will remove at 2  weeks. 06/28/18- She is here in follow up evalution. Apligraf was placed last week, she states the dressing fell off on Tuesday and she was dressing with hydrofera blue. She is healed and will be discharged from the clinic today. She has been instructed to continue with smoking cessation, continue monitoring glucose levels, offloading for an additional 4 weeks and continue with hydrofera blue for additional two weeks for any possible microscopic opening. Readmission: 08/07/18 on evaluation today patient presents for reevaluation concerning the ulcer of her right great toe. She was previously discharged on 06/28/18 healed. Nonetheless she states that this began to show signs of drainage she subsequently went to her primary care provider. Subsequently an x-ray was performed on 08/01/18 which was negative. The patient was also placed on antibiotics at that time. Fortunately they should have been effective for the infection. Nonetheless she's been experiencing some improvement but still has [name] lot of drainage coming from the wound itself. 08/14/18 on evaluation today patient's wound actually does show signs of improvement in regard to the erythema at this point. She has completed the antibiotics. With that being said we did discuss the possibility of placing her in [name] total contact cast as of today although I think that I may want to give this just [name] little bit more time to ensure nothing recurrence as far as her infection is concerned. I do not want to put in the cast and risk infection at that time if things are not completely resolved. With that being said she is gonna require some debridement today. 08/21/18 on evaluation today patient actually appears to be doing okay in regard to her toe ulcer. She's been tolerating the dressing changes without complication. With that being said it does appear that she is ready and in fact I think it's appropriate for [place] to go ahead and initiate the total contact cast  today. Nonetheless she will require some sharp debridement to prepare the wound for application. Overall I feel like things have been progressing well but we do need to do something to get this to close more readily. 08/24/18 patient seen today for reevaluation after having had the total contact cast applied on Tuesday. She seems to have done very well the wound appears to be doing great and overall I'm pleased with the progress that she's  made. There were no abnormal areas of rubbing from the cast on her lower extremity. 08/30/18 on evaluation today patient actually appears to be completely healed in regard to her plantar toe ulcer. She tells me at this point she's been having [name] lot of issues with the cast. She almost fell [name] couple of times the state shall the step of her dog [name] couple times as well. This is been [name] very frustrating process for her other nonetheless she has completely healed the wound which is excellent news. Overall there does not appear to be the evidence of infection at this time which is great news. 09/11/18 evaluation today patient presents for follow-up concerning her great toe ulcer on the left which has unfortunately reopened since I last saw her which was only [name] couple of weeks ago. Unfortunately she was not able to get in to get the shoe and potentially the AFO that's gonna be necessary due to her left foot drop. She continues with offloading shoe but this is not enough to prevent her from reopening it appears. When we last had her in the total contact cast she did well from [name] healing standpoint but unfortunately the wound reopened as soon as she came out of the cast within just [name] couple of weeks. 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 This is also part of the reason why she  falls on [name] regular basis. I just do not believe that is gonna be safe for [place] to reinitiate the total contact cast as last time we had this on she fell 3 times one week which is definitely not normal for her. 09/18/18 upon evaluation today the patient actually appears to be doing about the same in regard to her toe ulcer. She did not contact [facility] as I asked her to even though I had given her the prescription. In fact she actually states that she has no idea where the prescription is. She did apparently call [facility] and they told her that all she needed to do was bring the prescription in order to be able to be seen and work on getting the AFO for her left foot. With all that being said she still does not have an appointment and I'm not sure were things stand that regard. I will give her [name] new prescription today in order to contact them to get this set up. 09/25/18 on evaluation today patient actually appears to be doing about the same in regard to her toes ulcer. She does have [name] small areas which seems to have [name] lot of callous buildup around the edge of the wound which is going to need sharp debridement today. She still is waiting to be scheduled for evaluation with [facility] for possibility of an AFO. She states there supposed to call her tomorrow to get this set up. Unfortunately it does appear that her foot specifically the toe area is showing signs of erythema. There does not appear to be any systemic infection which is in these good news. 10/02/18 on evaluation today patient actually appears to be doing about the same in regard to her toe ulcer. This really has not done too well although it's not significantly larger it's also not significantly smaller. She has been tolerating the dressing changes without complication.  She actually has her appointment with [facility] and [facility] tomorrow to hopefully be measured for obtaining and AFO splint. I think this would be helpful preventing this from  reoccurring. We had contemplated starting the cast this week although to be honest I am reluctant to do that as she's been having nausea, vomiting, and seizure activity over the past three days. She has [name] history of seizures and have been told is nothing that can be done for these. With that being said I do believe that along with the seizures have the nausea vomiting which upon further questioning doesn't seem to be the normal for her and makes me concerned for the possibility of infection or something else going on. I discussed this with the patient and her mother during the office visit today. I do not feel the wound is effective but maybe something else. The responses this was "this just happens to her at times and we don't know why". They did not seem to be interested in going to the hospital to have this checked out further. [name], [name] ([id number]) [id number] Page 14 of 21 10/09/18 on evaluation today patient presents for follow-up concerning her ongoing toe ulcer. She has been tolerating the dressing changes without complication. Fortunately there does not appear to be any evidence of infection which is great news however I do think that the patient would benefit from going ahead for with the total contact cast. She's actually in [name] wheelchair today she tells me that she will use her walker if we initiate the cast. I was very specific about the fact that if we were gonna do the cast I wanted to make sure that she was using the walker in order to prevent any falls. She tells me she does not have stairs that she has to traverse on [name] regular basis at her home. She has not had any seizures since last week again that something that happens to her often she tells me she did talk to [employer] and they said that it may take up to three weeks to get the brace approved for her. Hopefully that will not take that long but nonetheless in the meantime I do think the cast could  be of benefit. 10/12/18 on evaluation today patient appears to be doing rather well in regard to her toe ulcer. It's just been [name] few days and already this is significantly improved both as far as overall appearance and size. Fortunately there's no sign of infection. She is here for her first obligatory cast change. 10/19/18 Seen today for follow up and management of left great toe ulcer. Wound continues to show improvement. Noted small open area with seroussang drainage with palpation. Denies any increased pain or recent fevers during visit. She will continue calcium alginate with offloading shoe. Denies any questions or concerns during visit. 10/26/18 on evaluation today patient appears to be doing about the same as when I last saw her in regard to her wound bed. Fortunately there does not appear to be any signs of infection. Unfortunately she continues to have [name] breakdown in regard to the toe region any time that she is not in the cast. It takes almost no time at all for this to happen. Nonetheless she still has not heard anything from the brace being made by [employer] as to when exactly this will be available to her. Fortunately there is no signs of infection at this time. 10/30/18 on evaluation today patient presents for application of the total contact  cast as we just received him this morning. Fortunately we are gonna be able to apply this to her today which is great news. She continues to have no significant pain which is good news. Overall I do feel like things have been improving while she was the cast is when she doesn't have [name] cast that things get worse. She still has not really heard anything from [facility] regarding her brace. 11/02/18 upon evaluation today patient's wound already appears to be doing significantly better which is good news. Fortunately there does not appear to be any signs of infection also good news. Overall I do think the total contact cast as before is helping to heal this area  unfortunately it's just not gonna likely keep the area closed and healed without her getting her brace at least. Again the foot drop is [name] significant issue for her. 11/09/18 on evaluation today patient appears to be doing excellent in regard to her toe ulcer which in fact is completely healed. Fortunately we finally got the situation squared away with the paperwork which was needed to proceed with getting her brace approved by Medicaid. I have filled that out unfortunately that information has been sent to the orthopedic office that I worked at 2 1/2 years ago and not tired Current wound care measures. Fortunately she seems to be doing very well at this time. 11/23/18 on evaluation today patient appears to be doing More Poorly Compared to Last Time I Saw Her. At [facility] She Had Completely Healed. Currently she is continuing to have issues with reopening. She states that she just found out that the brace was approved through Medicaid now she just has to go get measured in order to have this fitted for her and then made. Subsequently she does not have an appointment for this yet that is going to complicate things we obviously cannot put her back in the cast if we do not have everything measured because they're not gonna be able to measure her foot while she is in the cast. 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 Obviously this is unfortunate and does have me somewhat worried as well. 11/30/18 on evaluation today patient's toe ulcer actually appears to be doing fairly well. The good news is she will be getting her brace in the shoes next week on Wednesday. Hopefully we will be able to get this to heal without having to go back in the cast however she may need the cast in order to get the wound completely heal and then go from there. Fortunately there's no signs of infection at this time. 12/07/18 on  evaluation today patient fortunately did receive her brace and she states she could tell this definitely makes her walk better. With that being said she's been having issues with her toe where she noticed yesterday there was [name] lot of tissue that was loosing off this appears to be much larger than what it was previous. She also states that her leg has been read putting much across the top of her foot just about the ankle although this seems to be receiving somewhat. The total area is still red and appears to be someone infected as best I can tell. She is previously taken Bactrim and that may be [name] good option for her today as well. We are gonna see what I wound culture shows as well and I  think that this is definitely appropriate. With that being said outside of the culture I still need to initiate something in the interim and that's what I'm gonna go ahead and select Bactrim is [name] good option for her. 12/14/18 on evaluation today patient appears to be doing better in regard to her left great toe ulcer as compared to last week's evaluation. There's still some erythema although this is significantly improved which is excellent news. Overall I do believe that she is making good progress is still gonna take some time before she is where I would like her to be from the standpoint of being able to place her back into the total contact cast. Hopefully we will be where we need to be by next week. 12/21/18 on evaluation today patient actually appears to be doing poorly in regard to her toe ulcer. She's been tolerating the dressing changes without complication. Fortunately there's no signs of systemic infection although she does have [name] lot of drainage from the toe ulcer and this does seem to be causing some issues at this point. She does have erythema on the distal portion of her toe that appears to be likely cellulitis. 12/28/18 on evaluation today patient actually appears to be doing [name] little better in my pinion in  regard to her toe ulcer. With that being said she still does have some evidence of infection at this time and for her culture she had both E. coli as well as enterococcus as organisms noted on evaluation. For that reason I think that though the Keflex likely has treated the E. coli rather well this has really done nothing for the enterococcus. We are going to have to initiate treatment for this specifically. 01/04/19 on evaluation today patient's toe actually appears to be doing better from the standpoint of infection. She currently would like to see about putting the cash back on I think that this is appropriate as long as she takes care of it and keeps it from getting wet. She is gonna have some drainage we can definitely pass this up with Drawtex and alginate to try to prevent as much drainage as possible from causing the problems. With that being said I do want to at least try her with the cast between now and Tuesday. If there any issues we can't continue to use it then I will discontinue the use of the cast at that point. 01/08/19 on evaluation today patient actually appears to be doing very well as far as her foot ulcer specifically the great toe on the left is concerned. She did have an area of rubbing on the medial aspect of her left ankle which again is from the cast. Fortunately there's no signs of infection at this point in this appears to be [name] very slight skin breakdown. The patient tells me she felt it rubbing but didn't think it was that bad. Fortunately there is no signs of active infection at this time which is good news. No fevers, chills, nausea, or vomiting noted at this time. 01/15/19 on evaluation today patient actually appears to be doing well in regard to her toe ulcer. Again as previous she seems to do well and she has the cast on which indicates to me that during the time she doesn't have [name] cast on she's putting way too much pressure on this region. Obviously I think that's gonna  be an issue as with the current national emergency concerning the Covid-19 Virus it has been recommended that we discontinue  the use of total contact casting by the chief medical officer of our company, Dr. Simona Huh. The reasoning is that if [name] patient becomes sick and cannot come into have the cast removed they could not just leave this on for an additional two weeks. Obviously the hospitals also do not want to receive patient's who are sick into the emergency department to potentially contaminate the region and spread the Covid-19 Virus among other sick individuals within the hospital system. Therefore at this point we are suspending the use of total contact cast until the current emergency subsides. This was all discussed with the patient today as well. 01/22/19 on evaluation today patient's wound on her left great toe appears to be doing slightly worse than previously noted last week. She tells me that she has been on this quite [name] bit in fact she tells me she's been awake for 38 straight hours. This is due to the fact that she's having to care for grandparents because nobody else will. She has been taking care of them for five the last seven days since I've seen her they both have dementia his is from [name] stroke and her grandmother's was progressive. Nonetheless she states even her mom who knows her condition and situation has only help two of those days to take care of them she's been taking care of the rest. Fortunately there does not appear to be any signs of active infection in regard to her toe at this point although obviously it doesn't look as good as it did previous. I think this is directly related to her not taking off the pressure and friction by way of taking things easy. Though I completely understand what's going on. TAYLOUR, LIETZKE [name] ([id number]) [id number] Page 15 of 21 01/29/19 on evaluation today patient's tools are actually appears to be showing some signs of  improvement today compared to last week's evaluation as far as not necessarily the overall size of the wound but the fact that she has some new skin growth in between the two ends of the wound opening. Overall I feel like she has done well she states that she had [name] family member give her what sounds to be [name] CAM walker boot which has been helpful as well. 02/05/19 on evaluation today patient's wound bed actually appears to be doing significantly better in regard to her overall appearance of the size of the wound. With that being said she is still having an issue with offloading efficiently enough to get this to close. Apparently there is some signs of infection at this point as well unfortunately. Previously she's done well of Augmentin I really do not see anything that needs to be culture currently but there theme and cellulitis of the foot that I'm seeing I'm gonna go ahead and place her on an antibiotic today to try to help clear this up. 02/12/2019 on evaluation today patient actually appears to be doing poorly in regard to her overall wound status. She tells me she has been using her offloading shoe but actually comes in today wearing her tennis shoe with the AFO brace. Again as I previously discussed with her this is really not sufficient to allow the area to heal appropriately. Nonetheless she continues to be somewhat noncompliant and I do wonder based on what she has told my nurse in the past as to whether or not [name] good portion of this noncompliance may be recreational drug and alcohol related. She has had [name] history of heroin overdose and  this was fairly recently in the past couple of months that have been seeing her. Nonetheless overall I feel like her wound looks significantly worse today compared to what it was previous. She still has significant erythema despite the Augmentin I am not sure that this is an appropriate medication for her infection I am also concerned that the infection may have  gone down into her bone. 02/19/19 on evaluation today patient actually appears to be doing about the same in regard to her toe ulcer. Unfortunately she continues to show signs of bone exposure and infection at this point. There does not appear to be any evidence of worsening of the infection but I'm also not really sure that it's getting significantly better. She is on the Augmentin which should be sufficient for the Staphylococcus aureus infection that she has at this point. With that being said she may need IV antibiotics to more appropriately treat this. We did have [name] discussion today about hyperbaric option therapy. 02/28/19 on evaluation today patient actually appears to be doing much worse in regard to the wound on her left great toe as compared to even my previous evaluation last week. Unfortunately this seems to be training in [name] pretty poor direction. Her toe was actually now starting to angle laterally and I can actually see the entire joint area of the proximal portion of the digit where is the distal portion of the digit again is no longer even in contact with the joint line. Unfortunately there's [name] lot more necrotic tissue around the edge and the toe appears to be showing signs of becoming gangrenous in my pinion. I'm very concerned about were things stand at this point. She did see infectious disease and they are planning to send in [name] prescription for Sivextro for her and apparently this has been approved. With that being said I don't think she should avoid taking this but at the same time I'm not sure that it's gonna be sufficient to save her toe at this point. She tells me that she still having to care for grandparents which I think is putting quite [name] bit of strain on her foot and specifically the total area and has caused this to break down even to [name] greater degree than would've otherwise been expected. 03/05/19 on evaluation today patient actually appears to be doing quite well in regard to  her toe all things considering. She still has bone exposed but there appears to be much less your thing on overall the appearance of the wound and the toe itself is dramatically improved. She still does have some issues currently obviously with infection she did see vascular as well and there concerned that her blood flow to the toad. For that reason they are setting up for an angiogram next week. 03/14/19 on evaluation today patient appears to be doing very poor in regard to her toe and specifically in regard to the ulceration and the fact that she's starting to notice the toe was leaning even more towards the lateral aspect and the complete joint is visible on the proximal aspect of the joint. Nonetheless she's also noted [name] significant odor and the tip of the toe is turning more dark and necrotic appearing. Overall I think she is getting worse not better as far as this is concerned. For that reason I am recommending at this point that she likely needs to be seen for likely amputation. READMISSION 03/19/2021 This is [name] patient that we cared for in this clinic for  [name] prolonged period of time in 2019 and 2020 with [name] left foot and left first toe wound. 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 Luana Shu. In December 2021 she had an ulcer on her right great toe as well as the fourth and fifth toes. 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 She also had an angiogram at that time and underwent angioplasty of the right anterior tibial artery. In any case she claims that the wound on the right foot is closed I did not look at this today which was probably an oversight although I think that should be done next week. 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 According to Dr. Deborra Medina last review that she was out of the area  being cared for by another physician but recently came back to his attention. 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 [name] culture of this area showed E. coli apparently before she came back to see Dr. Luana Shu she was supposed to be receiving antibiotics but she did not really take them. Nor is she offloading this area at all. Finally her last hemoglobin A1c listed in epic was in March 2022 at 14.1 she says things are [name] lot better since then although I am not sure. 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 She had this wound at the time. During this admission she had E. coli sepsis [name] TEE was negative. 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 She is still [name] heavy cigarette smoker. She has [name] history of bipolar disorder seizure disorder ABI in our clinic was 1.05 6/1; left midfoot in the setting of [name] TMA done previously. Round circular wound with [name] "knuckle" of protruding tissue. The problem is that the knuckle was not attached to any of the surrounding granulation and this probed proximally widely I removed [name] large portion of this tissue. This wound goes with considerable undermining laterally. I do not feel any bone there was no purulence but this is [name] deep wound. 6/8; in spite of the debridement I did last week. She arrives with [name] wound looking exactly the same. [name] protruding "knuckle" of tissue nonadherent to most of the surrounding tissue. There is considerable depth around this from 6-12 o'clock at 2.7 cm and undermining of 1 cm. This does not look overtly infected and the x- ray I did last week was negative for any osseous abnormalities. We have  been using silver collagen 6/15; deep tissue culture I did last week showed moderate staph aureus and moderate Pseudomonas. This will definitely require prolonged antibiotic therapy. The pathology on the protuberant area was negative for malignancy fungus etc. 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 We have been using silver collagen. I am going to be prescribing Levaquin for 2 weeks. Her CT scan of the foot is down for 7/5 6/22; CT scan of the foot on 7 5. She says she has hardware in the left leg from her previous fracture. She is on the  Levaquin for the deep tissue culture I did that showed methicillin sensitive staph aureus and Pseudomonas. I gave her [name] 2-week supply and she will have another week. She arrives in clinic today with the same protuberant tissue however this is nonadherent to the tissue surrounding it. I am really at [name] loss to explain this unless there is underlying deep tissue infection 6/29; patient presents for 1 week follow-up. She has been using collagen to the wound bed. She reports taking her antibiotics as prescribed.She has no complaints or issues today. She denies signs of infection. 7/6; patient presents for one week followup. She has been using collagen to the wound bed. She states she is taking Levaquin however at times she is not able to keep it down. She denies signs of infection. 7/13; patient presents for 1 week follow-up. She has been using silver alginate to the wound bed. She still has nausea when taking her antibiotics. She denies signs of infection. 7/20; patient presents for 1 week follow-up. She has been using silver alginate with gentamicin cream to the wound bed. She denies any issues and has no complaints today. She denies signs of infection. 7/27; patient presents for 1 week follow-up. She continues to use silver alginate with gentamicin cream to the wound bed. She reports starting her  antibiotics. She has no issues or complaints. Overall she reports stability to the wound. 8/3; patient presents for 1 week follow-up. She has been using silver alginate with gentamicin cream to the wound bed. She reports completing all antibiotics. She has no issues or complaints today. She denies signs of infection. 8/17; patient presents for 2-week follow-up. He is to use silver alginate to the wound bed. She has no issues or complaints today. She denies signs of infection. She reports her pain has improved in her foot since last clinic visit 8/24; patient presents for 1 week follow-up. She continues to use silver alginate to the wound bed. She has no issues or complaints. She denies signs of [name], [name] ([id number]) [id number] Page 16 of 21 infection. Pain is stable. 9/7; patient presents for follow-up. She missed her last week appointment due to feeling ill. She continues to use silver alginate. She has [name] new wound to the right lower extremity that is covered in eschar. She states It occurred over the past week and has no idea how it started. She currently denies signs of infection. 9/14; patient presents for follow-up. T the left foot wound she has been using gentamicin cream and silver alginate. T the right lower extremity wound she has o o been keeping this covered and has not obtain Santyl. 9/21; patient presents for follow-up. She reports using gentamicin cream and silver alginate to the left foot and Santyl to the right lower extremity wound. She has no issues or complaints today. She denies signs of infection. 9/28; patient presents for follow-up. She reports [name] new wound to her right heel. She states this occurred [name] few days ago and is progressively gotten worse. She has been trying to clean the area with [name] Q-tip and Santyl. She reports stability in the other 2 wounds. She has been using gentamicin cream and silver alginate to the left foot and Santyl to  the right lower extremity wound. 10/12; patient presents for follow-up. She reports improvement to the wound beds. She is seeing vein and vascular to discuss the potential of [name] left BKA. She states they are going to do an arteriogram. She continues to use silver alginate with  dressing changes to her wounds. 11/2; patient presents for follow-up. She states she has not been doing dressing changes to the wound beds. She states she is not able to offload the areas. She reports chronic pain to her left foot wound. 11/9; patient presents for follow-up. She came in with only socks on. She states she forgot to put on shoes. It is unclear if she is doing any dressing changes. She currently denies systemic signs of infection. 11/16; patient presents for follow-up. She came again only with socks on. She states she does not wear shoes ever. It is unclear if she does dressing changes. She currently denies systemic signs of infection. 11/23; patient presents for follow-up. She wore her shoes today. It still unclear exactly what dressing she is using for each wound but she did states she obtained Dakin's solution and has been using this to the left foot wound. She currently denies signs of infection. 11/30; patient presents for follow-up. She has no issues or complaints today. She currently denies signs of infection. 12/7; patient presents for follow-up. She has no issues or complaints today. She has been using Hydrofera Blue to the right heel wound and Dakin solution to the left foot wound. Her right anterior leg wound is healed. She currently denies signs of infection. 12/14; patient presents for follow-up. She has been using Hydrofera Blue to the right heel and Dakin's to the left foot wounds. She has no issues or complaints today. She denies signs of infection. 12/21; patient presents for follow-up. She reports using Hydrofera Blue to the right heel and Dakin's to the left foot wound. She denies signs of  infection. 12/28; patient presents for follow-up. She continues to use Dakin's to the left foot wound and Hydrofera Blue to the right heel wound. She denies signs of infection. 1/4; patient presents for follow-up. She has no issues or complaints today. She denies signs of infection. 1/11; patient presents for follow-up. It is unclear if she has been dressing these wounds over the past week. She currently denies signs of infection. 1/18; patient presents for follow-up. She states she has been using Dakin's wet-to-dry dressings to the left foot. She has been using Hydrofera Blue to the right foot foot wound. She states that the anterior right leg wound has reopened and draining serous fluid. She denies signs of infection. 1/25; patient presents for follow-up. She has no issues or complaints today. 2/1; patient presents for follow-up. She has no issues or complaints today. She denies signs of infection. 2/8; patient presents for follow-up. She has lost her surgical shoes. She did not have [name] dressing to the right heel wound. She currently denies signs of infection. 2/15; patient presents for follow-up. She reports more pain to the right heel today. She denies purulent drainage Or fever/chills 2/22; patient presents for follow-up. She reports taking clindamycin over the past week. She states that she continues to have pain to her right heel. She reports purulent drainage. Readmission 03/16/2022 Ms. Lashanti Chambless is [name] 46 year old female with [name] past medical history of type 2 diabetes, osteomyelitis to her feet, chronic systolic heart failure and bipolar disorder that presents to the clinic for bilateral feet wounds and right lower extremity wound. She was last seen in our clinic on 12/15/2021. At that time she had purulent drainage coming out of her right plantar foot and I recommended she go to the ED. She states she went to [facility] and has been there for the past 3 months. I  cannot see the  records. She states she had OR debridement and was on several weeks of IV antibiotics while inpatient. Since discharge she has not been taking care of the wound beds. She had nothing on her feet other than socks today. She currently denies signs of infection. 5/31; patient presents for follow-up. She has been using Dakin's wet-to-dry dressings to the wound beds on her feet bilaterally and antibiotic ointment to the right anterior leg wound. She had [name] wound culture done at last clinic visit that showed moderate Pseudomonas aeruginosa sensitive to ciprofloxacin. She currently denies systemic signs of infection. 6/14; patient presents for follow-up. She received Keystone 5 days ago and has been using this on the wound beds. She states that last week she had to go to the hospital because she had increased warmth and erythema to the right foot. She was started on 2 oral antibiotics. She states she has been taking these. She currently denies systemic signs of infection. She has no issues or complaints today. 6/21; patient presents for follow-up. She states she has been using Keystone antibiotics to the wound beds. She has no issues or complaints today. She denies signs of infection. 6/28; patient presents for follow-up. She has been using Keystone antibiotics to the wound beds. She has no issues or complaints today. 7/12; patient presents for follow-up. Has been using Keystone antibiotics to the wound beds with calcium alginate. She has no issues or complaints today. She never followed up with her orthopedic surgeon who did the OR debridement to the right foot. We discussed the total contact cast for the left foot and patient would like to do this next week. 7/19; patient presents for follow-up. She has been using Keystone antibiotics with calcium alginate to the wound beds. She has no issues or complaints today. Patient is in agreement to do the total contact cast of the left foot today. She knows to  return later this week for the obligatory cast change. 05-13-2022 upon evaluation today patient's wound which she has the cast of the left leg actually appears to be doing significantly better. Fortunately I do not see any signs of active infection locally or systemically which is great news and overall I am extremely pleased with where we stand currently. 7/26; patient presents for follow-up. She has [name] cast in place for the past week. She states it irritated her shin. Other than that she tolerated the cast well. She states she would like [name] break for 1 week from the cast. We have been using Keystone antibiotic and Aquacel to both wound beds. She denies signs of infection. MAVA, SUARES [name] ([id number]) [id number] Page 17 of 21 8/2; patient presents for follow-up. She has been using Keystone and Aquacel to the wound beds. She denies any issues and has no complaints. She is agreeable to have the cast placed today for the left leg. 06-03-2022 upon evaluation today patient appears to be doing well with regard to her wound she saw some signs of improvement which is great news. Fortunately I do not see any evidence of active infection locally or systemically at this time which is great news. No fevers, chills, nausea, vomiting, or diarrhea. 8/16; patient presents for follow-up. She has no issues or complaints today. We have been using Keystone and Aquacel to the wound beds. The left lower extremity is in [name] total contact cast. She is tolerated this well. 8/23; patient presents for follow-up. She has had the total contact cast on the left leg for  the past week. Unfortunately this has rubbed and broken down the skin to the medial foot. She currently denies signs of infection. She has been using Keystone antibiotic to the right plantar foot wound. 8/30; patient presents for follow-up. We have held off on the total contact cast for the left leg for the past week. Her wound on the left  foot has improved and the previous surrounding breakdown of skin has epithelialized. She has been using Keystone antibiotic to both wound beds. She has no issues or complaints today. She denies signs of infection. 9/6; patient presents for follow-up. She has ordered her's Keystone antibiotic and this is arriving this week. She has been doing Dakin's wet-to-dry dressings to the wound beds. She denies signs of infection. She is agreeable to the total contact cast today. 9/13; patient presents for follow-up. She states that the cast caused her left leg shin to be sore. She would like to take [name] break from the cast this week. She has been using Keystone antibiotic to the right plantar foot wound. She denies signs of infection. 9/20; patient presents for follow-up. She has been using Keystone antibiotics to the wound beds with calcium alginate to the right foot wound and Hydrofera Blue to the left foot wound. She is agreeable to having the cast placed today. She has been approved for Apligraf and we will order this for next clinic visit. 9/27; patient presents for follow-up. We have been using Keystone antibiotic with Hydrofera Blue to the left foot wound under [name] total contact cast. T the right o foot wound she has been using Keystone antibiotic and calcium alginate. She declines [name] total contact cast today. Apligraf is available for placement and she would like to proceed with this. 07-28-2022 upon evaluation today patient appears to be doing well currently in regard to her wound. She is actually showing signs of significant improvement which is great news. Fortunately I do not see any evidence of active infection locally nor systemically at this time. She has been seeing Dr. Heber China and to be honest has been doing very well with the cast. Subsequently she comes in today with [name] cast on and we did reapply that today as well. She did not really want to she try to talk me out of that but I explained that if  she wanted to heal this is really the right way to go. Patient voiced understanding. In regard to her right foot this is actually [name] lot better compared to the last time I saw her which is also great news. 10/11; patient presents for follow-up. Apligraf and the total contact cast was placed to the left leg at last clinic visit. She states that her right foot wound had burning pain to it with the placement of Apligraf to this area. She has been doing [place] over this area. She denies signs of infection including increased warmth, erythema or purulent drainage. 11/1; 3-week follow-up. The patient fortunately did not have [name] total contact cast or an Apligraf and on the left foot. She has been using Keystone ABD pads and kerlix and her own running shoes She arrives in clinic today with thick callus and [name] very poor surface on the left foot on the right nonviable skin subcutaneous tissue and [name] deep probing hole. 11/15; patient missed her last clinic appointment. She states she has not been dressing the wound beds for the past 2 weeks. She states that at she had [name] new roommate but is now going back to  live with her mother. Apparently its been [name] distracting 2 weeks. Patient currently denies signs of infection. 11/22; patient presents for follow-up. She states she has been using Keystone antibiotic and Dakin's wet-to-dry dressings to the wound beds. She is agreeable for cast placement today. We had ordered Apligraf however this has not been received by our facility. 11/29; Patient had [name] total contact cast placed at last clinic visit and she tolerated this well. We were using silver alginate under the cast. Patient's been using Keystone antibiotic with Aquacel to the right plantar foot wound. She has no issues or complaints today. Apligraf is available for placement today. Patient would like to proceed with this. 12/6; patient presents for follow-up. She had Apligraf placed in standard fashion last clinic visit  under the total contact cast to the left lower extremity. She has been using Keystone antibiotic and Aquacel to the right plantar foot wound. She has no issues or complaints today. Objective Constitutional Vitals Time Taken: 10:31 AM, Height: 69 in, Weight: 178 lbs, BMI: 26.3, Temperature: 98.3 F, Pulse: 64 bpm, Respiratory Rate: 16 breaths/min, Blood Pressure: 132/84 mmHg. General Notes: Right foot: T the plantar heel there is an incision site with increased depth.T the opening there is granulation tissue. Left foot: T the medial o o o aspect there is an open wound with granulation tissue and nonviable tissue with surrounding callus. No overt signs of infection to any of the wound beds. Integumentary (Hair, Skin) Wound #11 status is Open. Original cause of wound was Surgical Injury. The date acquired was: 12/01/2021. The wound has been in treatment 28 weeks. The wound is located on the [place]. The wound measures 0.4cm length x 0.4cm width x 0.7cm depth; 0.126cm^2 area and 0.088cm^3 volume. There is Fat Layer (Subcutaneous Tissue) exposed. There is no tunneling or undermining noted. There is [name] medium amount of serosanguineous drainage noted. The wound margin is distinct with the outline attached to the wound base. There is medium (34-66%) pink, pale granulation within the wound bed. There is [name] medium (34-66%) amount of necrotic tissue within the wound bed. Wound #12 status is Open. Original cause of wound was Pressure Injury. The date acquired was: 03/16/2020. The wound has been in treatment 28 weeks. The wound is located on the [place]. The wound measures 2cm length x 1.7cm width x 0.1cm depth; 2.67cm^2 area and 0.267cm^3 volume. There is Fat Layer (Subcutaneous Tissue) exposed. There is no tunneling or undermining noted. There is [name] medium amount of serous drainage noted. The wound margin is flat and intact. There is large (67-100%) red, pink granulation within the  wound bed. There is no necrotic tissue within the wound bed. 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 I debrided nonviable tissue. Apligraf was placed in standard fashion to the left lower extremity. There is more maceration to the periwound and I recommended silver alginate over this and AandD ointment to skin breakdown. T otal contact cast was placed at this lower extremity. I recommended continuing with Keystone antibiotic and Aquacel to the right foot wound. Continue  aggressive offloading. Procedures Wound #11 Pre-procedure diagnosis of Wound #11 is an Open Surgical Wound located on the Right,Plantar Foot . There was [name] Excisional Skin/Subcutaneous Tissue Debridement with [name] total area of 1 sq cm performed by Kalman Shan, MD. With the following instrument(s): Curette to remove Viable and Non-Viable tissue/material. Material removed includes Callus, Subcutaneous Tissue, and Slough. [name] time out was conducted at 10:58, prior to the start of the procedure. [name] Minimum amount of bleeding was controlled with Pressure. The procedure was tolerated well. Post Debridement Measurements: 0.4cm length x 0.4cm width x 0.7cm depth; 0.088cm^3 volume. Character of Wound/Ulcer Post Debridement is stable. Post procedure Diagnosis Wound #11: Same as Pre-Procedure Wound #12 Pre-procedure diagnosis of Wound #12 is [name] Diabetic Wound/Ulcer of the Lower Extremity located on the Left,Medial,Plantar Foot .Severity  of Tissue Pre Debridement is: Fat layer exposed. There was [name] Excisional Skin/Subcutaneous Tissue Debridement with [name] total area of 5 sq cm performed by Kalman Shan, MD. With the following instrument(s): Curette to remove Viable and Non-Viable tissue/material. Material removed includes Callus, Subcutaneous Tissue, and Slough. [name] time out was conducted at 10:52, prior to the start of the procedure. [name] Minimum amount of bleeding was controlled with Pressure. The procedure was tolerated well. Post Debridement Measurements: 2cm length x 1.7cm width x 0.2cm depth; 0.534cm^3 volume. Character of Wound/Ulcer Post Debridement is stable. Severity of Tissue Post Debridement is: Fat layer exposed. Post procedure Diagnosis Wound #12: Same as Pre-Procedure Pre-procedure diagnosis of Wound #12 is [name] Diabetic Wound/Ulcer of the Lower Extremity located on the Left,Medial,Plantar Foot. [name] skin graft procedure using [name] bioengineered skin substitute/cellular or tissue based product was performed by Kalman Shan, MD with the following instrument(s): Blade, Forceps, and Scissors. Apligraf was applied and secured with Steri-Strips. 19 sq cm of product was utilized and 25 sq cm was wasted due to wound size. Post Application, aquacel, ABD was applied. [name] Time Out was conducted at 11:03, prior to the start of the procedure. The procedure was tolerated well. Post procedure Diagnosis Wound #12: Same as Pre-Procedure . Plan Follow-up Appointments: Return Appointment in 1 week. Nurse Visit as needed Bathing/ Shower/ Hygiene: May shower with wound dressing protected with water repellent cover or cast protector. No tub bath. Anesthetic (Use 'Patient Medications' Section for Anesthetic Order Entry): Lidocaine applied to wound bed Cellular or Tissue Based Products: Cellular or Tissue Based Product Type: - Apligraf #5 applied left foot Edema Control - Lymphedema / Segmental Compressive Device / Other: Elevate, Exercise  Daily and Avoid Standing for Long Periods of Time. Elevate legs to the level of the heart and pump ankles as often as possible Elevate leg(s) parallel to the floor when sitting. Off-Loading: T Contact Cast to Left Lower Extremity otal T Contact Cast to Right Lower Extremity - applied to left foot otal Open toe surgical shoe - Heel off loader - Right foot Other: - keep pressure off of feet. Medications-Please add to medication list.: Wound #12 Left,Medial,Plantar Foot: Keystone Compound - Keystone gel right foot WOUND #11: - Foot Wound Laterality: Plantar, Right Cleanser: Dakin 16 (oz) 0.25 1 x Per Day/30 Days Discharge Instructions: Use as directed. Cleanser: Soap and Water 1 x Per Day/30 Days Discharge Instructions: Gently cleanse wound with antibacterial soap, rinse and pat dry prior to dressing wounds Topical: keystone gel 1 x Per Day/30 Days Prim Dressing: Aquacel Extra Hydrofiber Dressing, 2x2 (in/in) 1 x Per Day/30 Days ary Secondary Dressing: ABD Pad 5x9 (in/in) (Generic) 1 x Per Day/30 Days Merendino, Jackquelyn  [name] ([id number]) [id number] Page 19 of 21 Discharge Instructions: Cover with ABD pad Secured With: Medipore T - 50M Medipore H Soft Cloth Surgical T ape ape, 2x2 (in/yd) (Generic) 1 x Per Day/30 Days Secured With: Kerlix Roll Sterile or Non-Sterile 6-ply 4.5x4 (yd/yd) (Generic) 1 x Per Day/30 Days Discharge Instructions: Apply Kerlix as directed WOUND #12: - Foot Wound Laterality: Plantar, Left, Medial Cleanser: Dakin 16 (oz) 0.25 (Dispense As Written) 1 x Per Day/30 Days Discharge Instructions: Use as directed. Topical: Vitamin AandD Ointment, 4(oz) tube 1 x Per Day/30 Days Discharge Instructions: apply to red, broken down area Prim Dressing: Gauze 1 x Per Day/30 Days ary Discharge Instructions: moisten with Dakins solution Secondary Dressing: ABD Pad 5x9 (in/in) (Generic) 1 x Per Day/30 Days Discharge Instructions: Cover with ABD pad Secured  With: Medipore T - 50M Medipore H Soft Cloth Surgical T ape ape, 2x2 (in/yd) (Generic) 1 x Per Day/30 Days Secured With: Kerlix Roll Sterile or Non-Sterile 6-ply 4.5x4 (yd/yd) (Generic) 1 x Per Day/30 Days Discharge Instructions: Apply Kerlix as directed 1. In office sharp debridement 2. Apligraf placed in standard fashionooleft lower extremity 3. T contact cast placed in standard fashionooleft lower extremity otal 4. Keystone antibiotic ointment and Aquacel to the right plantar foot wound 5. Surgical shoe with peg assist to the right foot 6. Follow-up in 1 week Electronic Signature(s) Signed: 09/28/2022 12:06:37 PM By: Kalman Shan DO Entered By: Kalman Shan on 09/28/2022 12:01:28 -------------------------------------------------------------------------------- ROS/PFSH Details Patient Name: Date of Service: Heather Hitch [name]. 09/28/2022 9:45 [name]: [id number] Patient Account Number: [id number] Date of Birth/Sex: Treating RN: 11-15-1975 (46 y.o. Orvan Falconer Primary Care Provider: Raelene Bott Other Clinician: Massie Kluver Referring Provider: Treating Provider/Extender: Eddie North in Treatment: 28 Information Obtained From Patient Ear/Nose/Mouth/Throat Medical History: Positive for: Chronic sinus problems/congestion; Middle ear problems Hematologic/Lymphatic Medical History: Positive for: Anemia Respiratory Medical History: Positive for: Chronic Obstructive Pulmonary Disease (COPD) Cardiovascular Medical History: Positive for: Congestive Heart Failure - EF 33% Endocrine Medical HistoryZOELLA, ROBERTI ([id number]) [id number] Page 20 of 21 Positive for: Type II Diabetes Time with diabetes: 14 years Treated with: Insulin, Oral agents Blood sugar tested every day: No Blood sugar testing results: Bedtime: 176 Genitourinary Medical History: Positive for: End Stage Renal  Disease Integumentary (Skin) Medical History: Positive for: History of pressure wounds Neurologic Medical History: Positive for: Neuropathy - feel and legs HBO Extended History Items Ear/Nose/Mouth/Throat: Ear/Nose/Mouth/Throat: Chronic sinus Middle ear problems problems/congestion Immunizations Pneumococcal Vaccine: Received Pneumococcal Vaccination: No Implantable Devices No devices added Family and Social History Current every day smoker; Marital Status - Single; Alcohol Use: Never; Drug Use: Prior History; Caffeine Use: Daily Electronic Signature(s) Signed: 09/28/2022 12:06:37 PM By: Kalman Shan DO Signed: 09/28/2022 4:17:47 PM By: Carlene Coria RN Entered By: Kalman Shan on 09/28/2022 12:02:06 -------------------------------------------------------------------------------- SuperBill Details Patient Name: Date of Service: Heather Hitch [name]. 09/28/2022 Medical Record Number: [id number] Patient Account Number: [id number] Date of Birth/Sex: Treating RN: [birth year] (46 y.o. Orvan Falconer Primary Care Provider: Raelene Bott Other Clinician: Massie Kluver Referring Provider: Treating Provider/Extender: Eddie North in Treatment: 28 Diagnosis Coding ICD-10 Codes Code Description [phone] 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ity 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.621 Type 2 diabetes mellitus with foot ulcer Schweers, Natahlia [name] ([id number]) [id number] Page 21 of 21 E11.42 Type 2 diabetes mellitus with diabetic polyneuropathy L97.811  Non-pressure chronic ulcer of other part of right lower leg limited to breakdown of skin Facility Procedures : CPT4 Code: [id number] Description: [id number] (Facility Use Only) Apligraf 44 SQ CM Modifier: Quantity: 44 : CPT4 Code: [id number] Description: 70177 - SKIN SUB GRAFT FACE/NK/HF/G ICD-10  Diagnosis Description 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 Modifier: ity Quantity: 1 : CPT4 Code: [id number] Description: 23300 - DEB SUBQ TISSUE 20 SQ CM/< ICD-10 Diagnosis Description L97.512 Non-pressure chronic ulcer of other part of right foot with fat layer exposed Modifier: Quantity: 1 Physician Procedures : CPT4 Code Description Modifier [id number] 35456 - WC PHYS SKIN SUB GRAFT FACE/NK/HF/G ICD-10 Diagnosis Description 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ity Quantity: 1 : [id number] 11042 - WC PHYS SUBQ TISS 20 SQ CM ICD-10 Diagnosis Description L97.512 Non-pressure chronic ulcer of other part of right foot with fat layer exposed Quantity: 1 Electronic Signature(s) Signed: 09/28/2022 12:06:37 PM By: Kalman Shan DO Entered By: Kalman Shan on 09/28/2022 12:01:43

## 2022-10-05 ENCOUNTER — Encounter (HOSPITAL_BASED_OUTPATIENT_CLINIC_OR_DEPARTMENT_OTHER): Payer: Medicaid Other | Admitting: Internal Medicine

## 2022-10-05 DIAGNOSIS — L97528 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ity: Secondary | ICD-10-CM

## 2022-10-05 DIAGNOSIS — E11621 Type 2 diabetes mellitus with foot ulcer: Secondary | ICD-10-CM

## 2022-10-05 DIAGNOSIS — L97512 Non-pressure chronic ulcer of other part of right foot with fat layer exposed: Secondary | ICD-10-CM

## 2022-10-11 NOTE — Progress Notes (Signed)
CLORA, OHMER [name] ([id number]) [id number] Page 1 of 20 Visit Report for 10/05/2022 Chief Complaint Document Details Patient Name: Date of Service: Heather [name], Heather [name] 10/05/2022 9:45 [name]: [id number] Patient Account Number: [id number] Date of Birth/Sex: Treating RN: [birth year] (46 y.o. Heather [name] Primary Care Provider: Raelene [name] Other Clinician: Massie [name] Referring Provider: Treating Provider/Extender: Heather [name] in Treatment: 29 Information Obtained from: Patient Chief Complaint 03/19/2021; patient referred by Dr. Luana [name] who has been looking after her left foot for quite [name] period of time for review of [name] nonhealing area in the left midfoot 03/12/2022; bilateral feet wounds and right lower extremity wound. Electronic Signature(s) Signed: 10/05/2022 11:46:52 AM By: Heather Shan DO Entered By: Heather [name] on 10/05/2022 10:44:43 -------------------------------------------------------------------------------- Debridement Details Patient Name: Date of Service: Heather Hitch [name] 10/05/2022 9:45 [name]: [id number] Patient Account Number: [id number] Date of Birth/Sex: Treating RN: Jun 08, 1976 (46 y.o. Heather [name] Primary Care Provider: Raelene [name] Other Clinician: Massie [name] Referring Provider: Treating Provider/Extender: Heather [name] in Treatment: 29 Debridement Performed for Assessment: Wound #12 Left,Medial,Plantar Foot Performed By: Physician Heather Shan, MD Debridement Type: Debridement Severity of Tissue Pre Debridement: Fat layer exposed Level of Consciousness (Pre-procedure): Awake and Alert Pre-procedure Verification/Time Out Yes - 10:23 Taken: Start Time: 10:23 T Area Debrided (L x W): otal 2 (cm) x 1.5 (cm) = 3 (cm) Tissue and other material debrided: Viable, Non-Viable, Callus, Slough, Subcutaneous, Slough Level:  Skin/Subcutaneous Tissue Debridement Description: Excisional Instrument: Curette Bleeding: Minimum Hemostasis Achieved: Pressure Response to Treatment: Procedure was tolerated well Level of Consciousness (Post- Awake and Alert procedure): Heather [name], Heather [name] ([id number]) [id number] Page 2 of 20 Post Debridement Measurements of Total Wound Length: (cm) 1.6 Width: (cm) 1 Depth: (cm) 0.1 Volume: (cm) 0.126 Character of Wound/Ulcer Post Debridement: Stable Severity of Tissue Post Debridement: Fat layer exposed Post Procedure Diagnosis Same as Pre-procedure Electronic Signature(s) Signed: 10/05/2022 11:46:52 AM By: Heather Shan DO Signed: 10/05/2022 3:37:15 PM By: Heather Coria RN Signed: 10/11/2022 4:44:06 PM By: Heather [name] Entered By: Heather [name] on 10/05/2022 10:24:16 -------------------------------------------------------------------------------- Debridement Details Patient Name: Date of Service: Heather Hitch [name] 10/05/2022 9:45 [name]: [id number] Patient Account Number: [id number] Date of Birth/Sex: Treating RN: August 16, 1976 (46 y.o. Heather [name] Primary Care Provider: Raelene [name] Other Clinician: Massie [name] Referring Provider: Treating Provider/Extender: Heather [name] in Treatment: 29 Debridement Performed for Assessment: Wound #11 Right,Plantar Foot Performed By: Physician Heather Shan, MD Debridement Type: Debridement Level of Consciousness (Pre-procedure): Awake and Alert Pre-procedure Verification/Time Out Yes - 10:25 Taken: Start Time: 10:25 T Area Debrided (L x W): otal 0.5 (cm) x 0.5 (cm) = 0.25 (cm) Tissue and other material debrided: Viable, Non-Viable, Callus, Slough, Subcutaneous, Slough Level: Skin/Subcutaneous Tissue Debridement Description: Excisional Instrument: Curette Bleeding: Minimum Hemostasis Achieved: Pressure Response to Treatment: Procedure was  tolerated well Level of Consciousness (Post- Awake and Alert procedure): Post Debridement Measurements of Total Wound Length: (cm) 0.3 Width: (cm) 0.4 Depth: (cm) 0.6 Volume: (cm) 0.057 Character of Wound/Ulcer Post Debridement: Stable Post Procedure Diagnosis Same as Pre-procedure Electronic Signature(s) Signed: 10/05/2022 11:46:52 AM By: Heather Shan DO Signed: 10/05/2022 3:37:15 PM By: Heather Coria RN Signed: 10/11/2022 4:44:06 PM By: Heather [name] Entered By: Heather [name] on 10/05/2022 10:28:15 Heather [name] ([id number]) [id number] Page 3 of 20 -------------------------------------------------------------------------------- HPI Details Patient Name: Date of Service: Heather [name], Heather [name] 10/05/2022 9:45 [name]: [id number]  Patient Account Number: [id number] Date of Birth/Sex: Treating RN: [birth year] (46 y.o. Heather [name] Primary Care Provider: Raelene [name] Other Clinician: Massie [name] Referring Provider: Treating Provider/Extender: Heather [name] in Treatment: 29 History of Present Illness HPI Description: 01/18/18-She is here for initial evaluation of the left great toe ulcer. She is [name] poor historian in regards to timeframe in detail. She states approximately 4 weeks ago she lacerated her toe on something in the house. She followed up with her primary care who placed her on Bactrim and ultimately [name] second dose of Bactrim prior to coming to wound clinic. She states she has been treating the toe with peroxide, Betadine and [name] Band-Aid. She did not check her blood sugar this morning but checked it yesterday morning it was 327; she is unaware of [name] recent A1c and there are no current records. She saw Dr. she would've orthopedics last week for an old injury to the left ankle, she states he did not see her toe, nor did she bring it to his attention. She smokes approximately 1 pack cigarettes [name] day. Her  social situation is concerning, she arrives this morning with her mother who appears extremely intoxicated/under the influence; her mother was asked to leave the room and be monitored by the patient's grandmother. The patient's aunt then accompanied the patient and the room throughout the rest of the appointment. We had [name] lengthy discussion regarding the deleterious effects of uncontrolled hyperglycemia and smoking as it relates to wound healing and overall health. She was strongly encouraged to decrease her smoking and get her diabetes under better control. She states she is currently on [name] diet and has cut down her [facility] consumption. The left toe is erythematous, macerated and slightly edematous with malodor present. The edema in her left foot is below her baseline, there is no erythema streaking. We will treat her with Santyl, doxycycline; we have ordered and xray, culture and provided [name] Peg assist surgical shoe and cultured the wound. 01/25/18-She is here in follow-up evaluation for [name] left great toe ulcer and presents with an abscess to her suprapubic area. She states her blood sugars remain elevated, feeling "sick" and if levels are below 250, but she is trying. She has made no attempt to decrease her smoking stating that we "can't take away her food in her cigarettes". She has been compliant with offloading using the PEG assist you. She is using Santyl daily. the culture obtained last week grew staph aureus and Enterococcus faecalis; continues on the doxycycline and Augmentin was added on Monday. The suprapubic area has erythema, no femoral variation, purple discoloration, minimal induration, was accessed with [name] cotton tip applicator with sanguinopurulent drainage, this was cultured, I suspect the current antibiotic treatment will cover and we will not add anything to her current treatment plan. She was advised to go to urgent care or ER with any change in redness, induration or  fever. 02/01/18-She is here in follow-up evaluation for left great toe ulcers and [name] new abdominal abscess from last week. She was able to use packing until earlier this week, where she "forgot it was there". She states she was feeling ill with GI symptoms last week and was not taking her antibiotic. She states her glucose levels have been predominantly less than 200, with occasional levels between 200-250. She thinks this was contributing to her GI symptoms as they have resolved without intervention. There continues to be significant laceration to left toe, otherwise it clinically looks  stable/improved. There is now less superficial opening to the lateral aspect of the great toe that was residual blister. We will transition to [facility] to all wounds, she will continue her Augmentin. If there is no change or deterioration next week for reculture. 02/08/18-She is here in follow-up evaluation for left great toe ulcer and abdominal ulcer. There is an improvement in both wounds. She has been wrapping her left toe with coban, not by our direction, which has created an area of discoloration to the medial aspect; she has been advised to NOT use coban secondary to her neuropathy. She states her glucose levels have been high over this last week ranging from 200-350, she continues to smoke. She admits to being less compliant with her offloading shoe. We will continue with same treatment plan and she will follow-up next week. 02/15/18-She is here in follow-up evaluation for left great toe ulcer and abdominal ulcer. The abdominal ulcer is epithelialized. The left great toe ulcer is improved and all injury from last week using the Coban wrap is resolved, the lateral ulcer is healed. She admits to noncompliance with wearing offloading shoe and admits to glucose levels being greater than 300 most of the week. She continues to smoke and expresses no desire to quit. There is one area medially that probes deeper than  it has historically, erythema to the toe and dorsal foot has consistently waxed and waned. There is no overt signs of cellulitis or infection but we will culture the wound for any occult infection given the new area of depth and erythema. We will hold off on sensitivities for initiation of antibiotic therapy. 02/22/18-She is here in follow up evaluation for left great toe ulcer. There is overall significant improvement in both wound appearance, erythema and edema with changes made last week. She was not initiated on antibiotic therapy. Culture obtained last week showed oxacillin sensitive staph aureus, sensitive to clindamycin. Clindamycin has been called into the pharmacy but she has been instructed to hold off on initiation secondary to overall clinical improvement and her history of antibiotic intolerance. She has been instructed to contact the clinic with any noted changes/deterioration and the wound, erythema, edema and/or pain. She will follow-up next week. She continues to smoke and her glucose levels remain elevated >250; she admits to compliance with offloading shoe 03/01/18 on evaluation today patient appears to be doing fairly well in regard to her left first toe ulcer. She has been tolerating the dressing changes with the [facility] Dressing without complication and overall this has definitely showed signs of improvement according to records as well is what the patient tells me today. I'm very pleased in that regard. She is having no pain today 03/08/18 She is here for follow up evaluation of [name] left great toe ulcer. She remains non-compliant with glucose control and smoking cessation; glucose levels consistently >200. She states that she got new shoe inserts/peg assist. She admits to compliance with offloading. Since my last evaluation there is significant improvement. We will switch to prisma at this time and she will follow up next week. She is noted to be tachycardic at this appointment,  heart rate 120s; she has [name] history of heart rate 70-130 according to our records. She admits to extreme agitation r/t personal issues; she was advised to monitor her heartrate and contact her physician if it does not return to [name] more normal range (<100). She takes cardizem twice daily. 03/15/18-She is here in follow-up evaluation for left great toe ulcer.  She remains noncompliant with glucose control and smoking cessation. She admits to compliance with wearing offloading shoe. The ulcer is improved/stable and we will continue with the same treatment plan and she will follow-up next week 03/22/18-She is here for evaluation for left great toe ulcer. There continues to be significant improvement despite recurrent hyperglycemia (over 500 yesterday) and she continues to smoke. She has been compliant with offloading and we will continue with same treatment plan and she will follow-up next week. 03/29/18-She is here for evaluation for left great toe ulcer. Despite continuing to smoke and uncontrolled diabetes she continues to improve. She is compliant with offloading shoe. We will continue with the same treatment plan and she will follow-up next week 04/05/18- She is here in follow up evaluation for [name] left great toe ulcer; she presents with small pustule to left fifth toe (resembles ant bite). She admits to compliance with wearing offloading shoe; continues to smoke or have uncontrolled blood glucose control. There is more callus than usual with evidence of bleeding; she denies known trauma. 04/12/18-She is here for evaluation of left great toe ulcer. Despite noncompliance with glycemic control and smoking she continues to make improvement. She continues to wear offloading shoe. The pustule, that was identified last week, to the left fifth toe is resolved. She will follow-up in 2 weeks 05/03/18-she is seen in follow-up evaluation for [name] left great toe ulcer. She is compliant with offloading, otherwise noncompliant  with glycemic control and [place], Emon [name] ([id number]) [id number] Page 4 of 20 smoking. She has plateaued and there is minimal improvement noted. We will transition to [facility], replaced the insert to her surgical shoe and she will follow-up in one week 05/10/18- She is here in follow up evaluation for [name] left great toe ulcer. It appears stable despite measurement change. We will continue with same treatment plan and follow up next week. 05/24/18-She is seen in follow-up evaluation for [name] left great toe ulcer. She remains compliant with offloading, has made significant improvement in her diet, decreasing the amount of sugar/soda. She said her recent A1c was 10.9 which is lower than. She did see [name] diabetic nutritionist/educator yesterday. She continues to smoke. We will continue with the same treatment plan and she'll follow-up next week. 05/31/18- She is seen in follow-up evaluation for left great toe ulcer. She continues to remain compliant with offloading, continues to make improvement in her diet, increasing her water and decreasing the amount of sugar/soda. She does continue to smoke with no desire to quit. We will apply Prisma to the depth and Hydrofera Blue over. We have not received insurance authorization for oasis. She will follow up next week. 06/07/18-She is seen in follow-up evaluation for left great toe ulcer. It has stalled according to today's measurements although base appears stable. She says she saw [name] diabetic educator yesterday; her average blood sugars are less than 300 which is an improvement for her. She continues to smoke and states "that's my next step" She continues with water over soda. We will order for xray, culture and reinstate ace wrap compression prior to placing apligraf for next week. She is voicing no complaints or concerns. Her dressing will change to iodoflex over the next week in preparation for apligraf. 06/14/18-She is seen in  follow-up evaluation for left great toe ulcer. Plain film x-ray performed last week was negative for osteomyelitis. Wound culture obtained last week grew strep B and OSSA; she is initiated on keflex and cefdinir today; there is erythema  to the toe which could be from ace wrap compression, she has [name] history of wrapping too tight and has has been encouraged to maintain ace wraps that we place today. We will hold off on application of apligraf today, will apply next week after antibiotic therapy has been initiated. She admits today that she has resumed taking [name] shower with her foot/toe submerged in water, she has been reminded to keep foot/toe out of the bath water. She will be seen in follow up next week 06/21/18-she is seen in follow-up evaluation for left great toe ulcer. She is tolerating antibiotic therapy with no GI disturbance. The wound is stable. Apligraf was applied today. She has been decreasing her smoking, only had 4 cigarettes yesterday and 1 today. She continues being more compliant in diabetic diet. She will follow-up next week for evaluation of site, if stable will remove at 2 weeks. 06/28/18- She is here in follow up evalution. Apligraf was placed last week, she states the dressing fell off on Tuesday and she was dressing with hydrofera blue. She is healed and will be discharged from the clinic today. She has been instructed to continue with smoking cessation, continue monitoring glucose levels, offloading for an additional 4 weeks and continue with hydrofera blue for additional two weeks for any possible microscopic opening. Readmission: 08/07/18 on evaluation today patient presents for reevaluation concerning the ulcer of her right great toe. She was previously discharged on 06/28/18 healed. Nonetheless she states that this began to show signs of drainage she subsequently went to her primary care provider. Subsequently an x-ray was performed on 08/01/18 which was negative. The patient was  also placed on antibiotics at that time. Fortunately they should have been effective for the infection. Nonetheless she's been experiencing some improvement but still has [name] lot of drainage coming from the wound itself. 08/14/18 on evaluation today patient's wound actually does show signs of improvement in regard to the erythema at this point. She has completed the antibiotics. With that being said we did discuss the possibility of placing her in [name] total contact cast as of today although I think that I may want to give this just [name] little bit more time to ensure nothing recurrence as far as her infection is concerned. I do not want to put in the cast and risk infection at that time if things are not completely resolved. With that being said she is gonna require some debridement today. 08/21/18 on evaluation today patient actually appears to be doing okay in regard to her toe ulcer. She's been tolerating the dressing changes without complication. With that being said it does appear that she is ready and in fact I think it's appropriate for [place] to go ahead and initiate the total contact cast today. Nonetheless she will require some sharp debridement to prepare the wound for application. Overall I feel like things have been progressing well but we do need to do something to get this to close more readily. 08/24/18 patient seen today for reevaluation after having had the total contact cast applied on Tuesday. She seems to have done very well the wound appears to be doing great and overall I'm pleased with the progress that she's made. There were no abnormal areas of rubbing from the cast on her lower extremity. 08/30/18 on evaluation today patient actually appears to be completely healed in regard to her plantar toe ulcer. She tells me at this point she's been having [name] lot of issues with the cast. She  almost fell [name] couple of times the state shall the step of her dog [name] couple times as well. This is been [name] very  frustrating process for her other nonetheless she has completely healed the wound which is excellent news. Overall there does not appear to be the evidence of infection at this time which is great news. 09/11/18 evaluation today patient presents for follow-up concerning her great toe ulcer on the left which has unfortunately reopened since I last saw her which was only [name] couple of weeks ago. Unfortunately she was not able to get in to get the shoe and potentially the AFO that's gonna be necessary due to her left foot drop. She continues with offloading shoe but this is not enough to prevent her from reopening it appears. When we last had her in the total contact cast she did well from [name] healing standpoint but unfortunately the wound reopened as soon as she came out of the cast within just [name] couple of weeks. 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 This is also part of the reason why she falls on [name] regular basis. I just do not believe that is gonna be safe for [place] to reinitiate the total contact cast as last time we had this on she fell 3 times one week which is definitely not normal for her. 09/18/18 upon evaluation today the patient actually appears to be doing about the same in regard to her toe ulcer. She did not contact [facility] as I asked her to even though I had given her the prescription. In fact she actually states that she has no idea where the prescription is. She did apparently call [facility] and they told her that all she needed to do was bring the prescription in order to be able to be seen and work on getting the AFO for her left foot. With all that being said she still does not have an appointment and I'm not sure were things stand that regard. I will give her [name] new prescription today in order  to contact them to get this set up. 09/25/18 on evaluation today patient actually appears to be doing about the same in regard to her toes ulcer. She does have [name] small areas which seems to have [name] lot of callous buildup around the edge of the wound which is going to need sharp debridement today. She still is waiting to be scheduled for evaluation with [facility] for possibility of an AFO. She states there supposed to call her tomorrow to get this set up. Unfortunately it does appear that her foot specifically the toe area is showing signs of erythema. There does not appear to be any systemic infection which is in these good news. 10/02/18 on evaluation today patient actually appears to be doing about the same in regard to her toe ulcer. This really has not done too well although it's not significantly larger it's also not significantly smaller. She has been tolerating the dressing changes without complication. She actually has her appointment with [facility] and [facility] tomorrow to hopefully be measured for obtaining and AFO splint. I think this would be helpful preventing this from reoccurring. We had contemplated starting the cast this week although to be honest I am reluctant to do that as she's  been having nausea, vomiting, and seizure activity over the past three days. She has [name] history of seizures and have been told is nothing that can be done for these. With that being said I do believe that along with the seizures have the nausea vomiting which upon further questioning doesn't seem to be the normal for her and makes me concerned for the possibility of infection or something else going on. I discussed this with the patient and her mother during the office visit today. I do not feel the wound is effective but maybe something else. The responses this was "this just happens to her at times and we don't know why". They did not seem to be interested in going to the hospital to have this checked out  further. 10/09/18 on evaluation today patient presents for follow-up concerning her ongoing toe ulcer. She has been tolerating the dressing changes without complication. Fortunately there does not appear to be any evidence of infection which is great news however I do think that the patient would benefit from going ahead for with the total contact cast. She's actually in [name] wheelchair today she tells me that she will use her walker if we initiate the cast. I was very specific about the fact that if we were gonna do the cast I wanted to make sure that she was using the walker in order to prevent any falls. She tells me she does not have stairs that she has to traverse on [name] regular basis at her home. She has not had any seizures since last week again that something that happens to her often she tells me she did talk to [employer] and they said that it may take up to three weeks to get the brace approved for her. Hopefully that will not take that long but nonetheless in the meantime I do think the cast could be of benefit. 10/12/18 on evaluation today patient appears to be doing rather well in regard to her toe ulcer. It's just been [name] few days and already this is significantly improved both as far as overall appearance and size. Fortunately there's no sign of infection. She is here for her first obligatory cast change. KAGAN, HIETPAS [name] ([id number]) [id number] Page 5 of 20 10/19/18 Seen today for follow up and management of left great toe ulcer. Wound continues to show improvement. Noted small open area with seroussang drainage with palpation. Denies any increased pain or recent fevers during visit. She will continue calcium alginate with offloading shoe. Denies any questions or concerns during visit. 10/26/18 on evaluation today patient appears to be doing about the same as when I last saw her in regard to her wound bed. Fortunately there does not appear to be any signs of  infection. Unfortunately she continues to have [name] breakdown in regard to the toe region any time that she is not in the cast. It takes almost no time at all for this to happen. Nonetheless she still has not heard anything from the brace being made by [employer] as to when exactly this will be available to her. Fortunately there is no signs of infection at this time. 10/30/18 on evaluation today patient presents for application of the total contact cast as we just received him this morning. Fortunately we are gonna be able to apply this to her today which is great news. She continues to have no significant pain which is good news. Overall I do feel like things have been improving while she was the cast  is when she doesn't have [name] cast that things get worse. She still has not really heard anything from [place] regarding her brace. 11/02/18 upon evaluation today patient's wound already appears to be doing significantly better which is good news. Fortunately there does not appear to be any signs of infection also good news. Overall I do think the total contact cast as before is helping to heal this area unfortunately it's just not gonna likely keep the area closed and healed without her getting her brace at least. Again the foot drop is [name] significant issue for her. 11/09/18 on evaluation today patient appears to be doing excellent in regard to her toe ulcer which in fact is completely healed. Fortunately we finally got the situation squared away with the paperwork which was needed to proceed with getting her brace approved by Medicaid. I have filled that out unfortunately that information has been sent to the orthopedic office that I worked at 2 1/2 years ago and not tired Current wound care measures. Fortunately she seems to be doing very well at this time. 11/23/18 on evaluation today patient appears to be doing More Poorly Compared to Last Time I Saw Her. At [facility] She Had Completely Healed. Currently she is  continuing to have issues with reopening. She states that she just found out that the brace was approved through Medicaid now she just has to go get measured in order to have this fitted for her and then made. Subsequently she does not have an appointment for this yet that is going to complicate things we obviously cannot put her back in the cast if we do not have everything measured because they're not gonna be able to measure her foot while she is in the cast. Unfortunately the other thing that I found out today as well is that she was in the hospital over the weekend due to having [name] heroin overdose. Obviously this is unfortunate and does have me somewhat worried as well. 11/30/18 on evaluation today patient's toe ulcer actually appears to be doing fairly well. The good news is she will be getting her brace in the shoes next week on Wednesday. Hopefully we will be able to get this to heal without having to go back in the cast however she may need the cast in order to get the wound completely heal and then go from there. Fortunately there's no signs of infection at this time. 12/07/18 on evaluation today patient fortunately did receive her brace and she states she could tell this definitely makes her walk better. With that being said she's been having issues with her toe where she noticed yesterday there was [name] lot of tissue that was loosing off this appears to be much larger than what it was previous. She also states that her leg has been read putting much across the top of her foot just about the ankle although this seems to be receiving somewhat. The total area is still red and appears to be someone infected as best I can tell. She is previously taken Bactrim and that may be [name] good option for her today as well. We are gonna see what I wound culture shows as well and I think that this is definitely appropriate. With that being said outside of the culture I still need to initiate something in the interim  and that's what I'm gonna go ahead and select Bactrim is [name] good option for her. 12/14/18 on evaluation today patient appears to be doing better  in regard to her left great toe ulcer as compared to last week's evaluation. There's still some erythema although this is significantly improved which is excellent news. Overall I do believe that she is making good progress is still gonna take some time before she is where I would like her to be from the standpoint of being able to place her back into the total contact cast. Hopefully we will be where we need to be by next week. 12/21/18 on evaluation today patient actually appears to be doing poorly in regard to her toe ulcer. She's been tolerating the dressing changes without complication. Fortunately there's no signs of systemic infection although she does have [name] lot of drainage from the toe ulcer and this does seem to be causing some issues at this point. She does have erythema on the distal portion of her toe that appears to be likely cellulitis. 12/28/18 on evaluation today patient actually appears to be doing [name] little better in my pinion in regard to her toe ulcer. With that being said she still does have some evidence of infection at this time and for her culture she had both E. coli as well as enterococcus as organisms noted on evaluation. For that reason I think that though the Keflex likely has treated the E. coli rather well this has really done nothing for the enterococcus. We are going to have to initiate treatment for this specifically. 01/04/19 on evaluation today patient's toe actually appears to be doing better from the standpoint of infection. She currently would like to see about putting the cash back on I think that this is appropriate as long as she takes care of it and keeps it from getting wet. She is gonna have some drainage we can definitely pass this up with Drawtex and alginate to try to prevent as much drainage as possible from causing  the problems. With that being said I do want to at least try her with the cast between now and Tuesday. If there any issues we can't continue to use it then I will discontinue the use of the cast at that point. 01/08/19 on evaluation today patient actually appears to be doing very well as far as her foot ulcer specifically the great toe on the left is concerned. She did have an area of rubbing on the medial aspect of her left ankle which again is from the cast. Fortunately there's no signs of infection at this point in this appears to be [name] very slight skin breakdown. The patient tells me she felt it rubbing but didn't think it was that bad. Fortunately there is no signs of active infection at this time which is good news. No fevers, chills, nausea, or vomiting noted at this time. 01/15/19 on evaluation today patient actually appears to be doing well in regard to her toe ulcer. Again as previous she seems to do well and she has the cast on which indicates to me that during the time she doesn't have [name] cast on she's putting way too much pressure on this region. Obviously I think that's gonna be an issue as with the current national emergency concerning the Covid-19 Virus it has been recommended that we discontinue the use of total contact casting by the chief medical officer of our company, Dr. Simona Huh. The reasoning is that if [name] patient becomes sick and cannot come into have the cast removed they could not just leave this on for an additional two weeks. Obviously the hospitals also do  not want to receive patient's who are sick into the emergency department to potentially contaminate the region and spread the Covid-19 Virus among other sick individuals within the hospital system. Therefore at this point we are suspending the use of total contact cast until the current emergency subsides. This was all discussed with the patient today as well. 01/22/19 on evaluation today patient's wound on her left great toe  appears to be doing slightly worse than previously noted last week. She tells me that she has been on this quite [name] bit in fact she tells me she's been awake for 38 straight hours. This is due to the fact that she's having to care for grandparents because nobody else will. She has been taking care of them for five the last seven days since I've seen her they both have dementia his is from [name] stroke and her grandmother's was progressive. Nonetheless she states even her mom who knows her condition and situation has only help two of those days to take care of them she's been taking care of the rest. Fortunately there does not appear to be any signs of active infection in regard to her toe at this point although obviously it doesn't look as good as it did previous. I think this is directly related to her not taking off the pressure and friction by way of taking things easy. Though I completely understand what's going on. 01/29/19 on evaluation today patient's tools are actually appears to be showing some signs of improvement today compared to last week's evaluation as far as not necessarily the overall size of the wound but the fact that she has some new skin growth in between the two ends of the wound opening. Overall I feel like she has done well she states that she had [name] family member give her what sounds to be [name] CAM walker boot which has been helpful as well. 02/05/19 on evaluation today patient's wound bed actually appears to be doing significantly better in regard to her overall appearance of the size of the wound. With that being said she is still having an issue with offloading efficiently enough to get this to close. Apparently there is some signs of infection at this point as well unfortunately. Previously she's done well of Augmentin I really do not see anything that needs to be culture currently but there theme and cellulitis of the foot that I'm seeing I'm gonna go ahead and place her on an  antibiotic today to try to help clear this up. 02/12/2019 on evaluation today patient actually appears to be doing poorly in regard to her overall wound status. She tells me she has been using her offloading shoe but actually comes in today wearing her tennis shoe with the AFO brace. Again as I previously discussed with her this is really not sufficient to allow the area to heal appropriately. Nonetheless she continues to be somewhat noncompliant and I do wonder based on what she has told my nurse in the past as to Heather [name], Heather [name] ([id number]) [id number] Page 6 of 20 whether or not [name] good portion of this noncompliance may be recreational drug and alcohol related. She has had [name] history of heroin overdose and this was fairly recently in the past couple of months that have been seeing her. Nonetheless overall I feel like her wound looks significantly worse today compared to what it was previous. She still has significant erythema despite the Augmentin I am not sure that this is an appropriate  medication for her infection I am also concerned that the infection may have gone down into her bone. 02/19/19 on evaluation today patient actually appears to be doing about the same in regard to her toe ulcer. Unfortunately she continues to show signs of bone exposure and infection at this point. There does not appear to be any evidence of worsening of the infection but I'm also not really sure that it's getting significantly better. She is on the Augmentin which should be sufficient for the Staphylococcus aureus infection that she has at this point. With that being said she may need IV antibiotics to more appropriately treat this. We did have [name] discussion today about hyperbaric option therapy. 02/28/19 on evaluation today patient actually appears to be doing much worse in regard to the wound on her left great toe as compared to even my previous evaluation last week. Unfortunately this seems  to be training in [name] pretty poor direction. Her toe was actually now starting to angle laterally and I can actually see the entire joint area of the proximal portion of the digit where is the distal portion of the digit again is no longer even in contact with the joint line. Unfortunately there's [name] lot more necrotic tissue around the edge and the toe appears to be showing signs of becoming gangrenous in my pinion. I'm very concerned about were things stand at this point. She did see infectious disease and they are planning to send in [name] prescription for Sivextro for her and apparently this has been approved. With that being said I don't think she should avoid taking this but at the same time I'm not sure that it's gonna be sufficient to save her toe at this point. She tells me that she still having to care for grandparents which I think is putting quite [name] bit of strain on her foot and specifically the total area and has caused this to break down even to [name] greater degree than would've otherwise been expected. 03/05/19 on evaluation today patient actually appears to be doing quite well in regard to her toe all things considering. She still has bone exposed but there appears to be much less your thing on overall the appearance of the wound and the toe itself is dramatically improved. She still does have some issues currently obviously with infection she did see vascular as well and there concerned that her blood flow to the toad. For that reason they are setting up for an angiogram next week. 03/14/19 on evaluation today patient appears to be doing very poor in regard to her toe and specifically in regard to the ulceration and the fact that she's starting to notice the toe was leaning even more towards the lateral aspect and the complete joint is visible on the proximal aspect of the joint. Nonetheless she's also noted [name] significant odor and the tip of the toe is turning more dark and necrotic appearing.  Overall I think she is getting worse not better as far as this is concerned. For that reason I am recommending at this point that she likely needs to be seen for likely amputation. READMISSION 03/19/2021 This is [name] patient that we cared for in this clinic for [name] prolonged period of time in 2019 and 2020 with [name] left foot and left first toe wound. 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 Luana [name]. In December  2021 she had an ulcer on her right great toe as well as the fourth and fifth toes. She underwent [name] partial ray amputation of the right fourth and fifth toes. She also had an angiogram at that time and underwent angioplasty of the right anterior tibial artery. In any case she claims that the wound on the right foot is closed I did not look at this today which was probably an oversight although I think that should be done next week. After her surgery she developed [name] dehiscence but I do not see any follow-up of this. According to Dr. Deborra Medina last review that she was out of the area being cared for by another physician but recently came back to his attention. 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 [name] culture of this area showed E. coli apparently before she came back to see Dr. Luana [name] she was supposed to be receiving antibiotics but she did not really take them. Nor is she offloading this area at all. Finally her last hemoglobin A1c listed in epic was in March 2022 at 14.1 she says things are [name] lot better since then although I am not sure. 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 She had this wound at the time. During this admission she had E. coli sepsis [name] TEE was negative. Past medical history is extensive and includes type 2 diabetes with peripheral neuropathy cardiomyopathy with  an ejection fraction of 33%, hypertension, hyperlipidemia chronic renal failure stage III history of substance abuse with cocaine although she claims to be clean now verified by her mother. She is still [name] heavy cigarette smoker. She has [name] history of bipolar disorder seizure disorder ABI in our clinic was 1.05 6/1; left midfoot in the setting of [name] TMA done previously. Round circular wound with [name] "knuckle" of protruding tissue. The problem is that the knuckle was not attached to any of the surrounding granulation and this probed proximally widely I removed [name] large portion of this tissue. This wound goes with considerable undermining laterally. I do not feel any bone there was no purulence but this is [name] deep wound. 6/8; in spite of the debridement I did last week. She arrives with [name] wound looking exactly the same. [name] protruding "knuckle" of tissue nonadherent to most of the surrounding tissue. There is considerable depth around this from 6-12 o'clock at 2.7 cm and undermining of 1 cm. This does not look overtly infected and the x- ray I did last week was negative for any osseous abnormalities. We have been using silver collagen 6/15; deep tissue culture I did last week showed moderate staph aureus and moderate Pseudomonas. This will definitely require prolonged antibiotic therapy. The pathology on the protuberant area was negative for malignancy fungus etc. 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 We have been using silver collagen. I am going to be prescribing Levaquin for 2 weeks. Her CT scan of the foot is down for 7/5 6/22; CT scan of the foot on 7 5. She says she has hardware in the left leg from her previous fracture. She is on the Levaquin for the deep tissue culture I did that showed methicillin sensitive staph aureus and Pseudomonas. I gave her [name] 2-week supply and she will have another week. She arrives in clinic today with the same  protuberant tissue however this is nonadherent to the tissue surrounding it. I am  really at [name] loss to explain this unless there is underlying deep tissue infection 6/29; patient presents for 1 week follow-up. She has been using collagen to the wound bed. She reports taking her antibiotics as prescribed.She has no complaints or issues today. She denies signs of infection. 7/6; patient presents for one week followup. She has been using collagen to the wound bed. She states she is taking Levaquin however at times she is not able to keep it down. She denies signs of infection. 7/13; patient presents for 1 week follow-up. She has been using silver alginate to the wound bed. She still has nausea when taking her antibiotics. She denies signs of infection. 7/20; patient presents for 1 week follow-up. She has been using silver alginate with gentamicin cream to the wound bed. She denies any issues and has no complaints today. She denies signs of infection. 7/27; patient presents for 1 week follow-up. She continues to use silver alginate with gentamicin cream to the wound bed. She reports starting her antibiotics. She has no issues or complaints. Overall she reports stability to the wound. 8/3; patient presents for 1 week follow-up. She has been using silver alginate with gentamicin cream to the wound bed. She reports completing all antibiotics. She has no issues or complaints today. She denies signs of infection. 8/17; patient presents for 2-week follow-up. He is to use silver alginate to the wound bed. She has no issues or complaints today. She denies signs of infection. She reports her pain has improved in her foot since last clinic visit 8/24; patient presents for 1 week follow-up. She continues to use silver alginate to the wound bed. She has no issues or complaints. She denies signs of infection. Pain is stable. 9/7; patient presents for follow-up. She missed her last week appointment due to feeling  ill. She continues to use silver alginate. She has [name] new wound to the right lower extremity that is covered in eschar. She states It occurred over the past week and has no idea how it started. She currently denies signs of infection. 9/14; patient presents for follow-up. T the left foot wound she has been using gentamicin cream and silver alginate. T the right lower extremity wound she has o o been keeping this covered and has not obtain Santyl. 9/21; patient presents for follow-up. She reports using gentamicin cream and silver alginate to the left foot and Santyl to the right lower extremity wound. She has no issues or complaints today. She denies signs of infection. 9/28; patient presents for follow-up. She reports [name] new wound to her right heel. She states this occurred [name] few days ago and is progressively gotten worse. She has been trying to clean the area with [name] Q-tip and Santyl. She reports stability in the other 2 wounds. She has been using gentamicin cream and silver alginate to the left foot and Santyl to the right lower extremity wound. Heather [name], Heather [name] ([id number]) [id number] Page 7 of 20 10/12; patient presents for follow-up. She reports improvement to the wound beds. She is seeing vein and vascular to discuss the potential of [name] left BKA. She states they are going to do an arteriogram. She continues to use silver alginate with dressing changes to her wounds. 11/2; patient presents for follow-up. She states she has not been doing dressing changes to the wound beds. She states she is not able to offload the areas. She reports chronic pain to her left foot wound. 11/9; patient presents for follow-up. She came in  with only socks on. She states she forgot to put on shoes. It is unclear if she is doing any dressing changes. She currently denies systemic signs of infection. 11/16; patient presents for follow-up. She came again only with socks on. She states she does  not wear shoes ever. It is unclear if she does dressing changes. She currently denies systemic signs of infection. 11/23; patient presents for follow-up. She wore her shoes today. It still unclear exactly what dressing she is using for each wound but she did states she obtained Dakin's solution and has been using this to the left foot wound. She currently denies signs of infection. 11/30; patient presents for follow-up. She has no issues or complaints today. She currently denies signs of infection. 12/7; patient presents for follow-up. She has no issues or complaints today. She has been using Hydrofera Blue to the right heel wound and Dakin solution to the left foot wound. Her right anterior leg wound is healed. She currently denies signs of infection. 12/14; patient presents for follow-up. She has been using Hydrofera Blue to the right heel and Dakin's to the left foot wounds. She has no issues or complaints today. She denies signs of infection. 12/21; patient presents for follow-up. She reports using Hydrofera Blue to the right heel and Dakin's to the left foot wound. She denies signs of infection. 12/28; patient presents for follow-up. She continues to use Dakin's to the left foot wound and Hydrofera Blue to the right heel wound. She denies signs of infection. 1/4; patient presents for follow-up. She has no issues or complaints today. She denies signs of infection. 1/11; patient presents for follow-up. It is unclear if she has been dressing these wounds over the past week. She currently denies signs of infection. 1/18; patient presents for follow-up. She states she has been using Dakin's wet-to-dry dressings to the left foot. She has been using Hydrofera Blue to the right foot foot wound. She states that the anterior right leg wound has reopened and draining serous fluid. She denies signs of infection. 1/25; patient presents for follow-up. She has no issues or complaints today. 2/1; patient  presents for follow-up. She has no issues or complaints today. She denies signs of infection. 2/8; patient presents for follow-up. She has lost her surgical shoes. She did not have [name] dressing to the right heel wound. She currently denies signs of infection. 2/15; patient presents for follow-up. She reports more pain to the right heel today. She denies purulent drainage Or fever/chills 2/22; patient presents for follow-up. She reports taking clindamycin over the past week. She states that she continues to have pain to her right heel. She reports purulent drainage. Readmission 03/16/2022 Ms. Monique Hefty is [name] 46 year old female with [name] past medical history of type 2 diabetes, osteomyelitis to her feet, chronic systolic heart failure and bipolar disorder that presents to the clinic for bilateral feet wounds and right lower extremity wound. She was last seen in our clinic on 12/15/2021. At that time she had purulent drainage coming out of her right plantar foot and I recommended she go to the ED. She states she went to [facility] and has been there for the past 3 months. I cannot see the records. She states she had OR debridement and was on several weeks of IV antibiotics while inpatient. Since discharge she has not been taking care of the wound beds. She had nothing on her feet other than socks today. She currently denies signs of infection. 5/31;  patient presents for follow-up. She has been using Dakin's wet-to-dry dressings to the wound beds on her feet bilaterally and antibiotic ointment to the right anterior leg wound. She had [name] wound culture done at last clinic visit that showed moderate Pseudomonas aeruginosa sensitive to ciprofloxacin. She currently denies systemic signs of infection. 6/14; patient presents for follow-up. She received Keystone 5 days ago and has been using this on the wound beds. She states that last week she had to go to the hospital because she had increased warmth  and erythema to the right foot. She was started on 2 oral antibiotics. She states she has been taking these. She currently denies systemic signs of infection. She has no issues or complaints today. 6/21; patient presents for follow-up. She states she has been using Keystone antibiotics to the wound beds. She has no issues or complaints today. She denies signs of infection. 6/28; patient presents for follow-up. She has been using Keystone antibiotics to the wound beds. She has no issues or complaints today. 7/12; patient presents for follow-up. Has been using Keystone antibiotics to the wound beds with calcium alginate. She has no issues or complaints today. She never followed up with her orthopedic surgeon who did the OR debridement to the right foot. We discussed the total contact cast for the left foot and patient would like to do this next week. 7/19; patient presents for follow-up. She has been using Keystone antibiotics with calcium alginate to the wound beds. She has no issues or complaints today. Patient is in agreement to do the total contact cast of the left foot today. She knows to return later this week for the obligatory cast change. 05-13-2022 upon evaluation today patient's wound which she has the cast of the left leg actually appears to be doing significantly better. Fortunately I do not see any signs of active infection locally or systemically which is great news and overall I am extremely pleased with where we stand currently. 7/26; patient presents for follow-up. She has [name] cast in place for the past week. She states it irritated her shin. Other than that she tolerated the cast well. She states she would like [name] break for 1 week from the cast. We have been using Keystone antibiotic and Aquacel to both wound beds. She denies signs of infection. 8/2; patient presents for follow-up. She has been using Keystone and Aquacel to the wound beds. She denies any issues and has no complaints. She  is agreeable to have the cast placed today for the left leg. 06-03-2022 upon evaluation today patient appears to be doing well with regard to her wound she saw some signs of improvement which is great news. Fortunately I do not see any evidence of active infection locally or systemically at this time which is great news. No fevers, chills, nausea, vomiting, or diarrhea. 8/16; patient presents for follow-up. She has no issues or complaints today. We have been using Keystone and Aquacel to the wound beds. The left lower extremity is in [name] total contact cast. She is tolerated this well. 8/23; patient presents for follow-up. She has had the total contact cast on the left leg for the past week. Unfortunately this has rubbed and broken down the [place], Candance [name] ([id number]) [id number] Page 8 of 20 skin to the medial foot. She currently denies signs of infection. She has been using Keystone antibiotic to the right plantar foot wound. 8/30; patient presents for follow-up. We have held off on the total contact cast for  the left leg for the past week. Her wound on the left foot has improved and the previous surrounding breakdown of skin has epithelialized. She has been using Keystone antibiotic to both wound beds. She has no issues or complaints today. She denies signs of infection. 9/6; patient presents for follow-up. She has ordered her's Keystone antibiotic and this is arriving this week. She has been doing Dakin's wet-to-dry dressings to the wound beds. She denies signs of infection. She is agreeable to the total contact cast today. 9/13; patient presents for follow-up. She states that the cast caused her left leg shin to be sore. She would like to take [name] break from the cast this week. She has been using Keystone antibiotic to the right plantar foot wound. She denies signs of infection. 9/20; patient presents for follow-up. She has been using Keystone antibiotics to the wound  beds with calcium alginate to the right foot wound and Hydrofera Blue to the left foot wound. She is agreeable to having the cast placed today. She has been approved for Apligraf and we will order this for next clinic visit. 9/27; patient presents for follow-up. We have been using Keystone antibiotic with Hydrofera Blue to the left foot wound under [name] total contact cast. T the right o foot wound she has been using Keystone antibiotic and calcium alginate. She declines [name] total contact cast today. Apligraf is available for placement and she would like to proceed with this. 07-28-2022 upon evaluation today patient appears to be doing well currently in regard to her wound. She is actually showing signs of significant improvement which is great news. Fortunately I do not see any evidence of active infection locally nor systemically at this time. She has been seeing Dr. Heber Seligman and to be honest has been doing very well with the cast. Subsequently she comes in today with [name] cast on and we did reapply that today as well. She did not really want to she try to talk me out of that but I explained that if she wanted to heal this is really the right way to go. Patient voiced understanding. In regard to her right foot this is actually [name] lot better compared to the last time I saw her which is also great news. 10/11; patient presents for follow-up. Apligraf and the total contact cast was placed to the left leg at last clinic visit. She states that her right foot wound had burning pain to it with the placement of Apligraf to this area. She has been doing [place] over this area. She denies signs of infection including increased warmth, erythema or purulent drainage. 11/1; 3-week follow-up. The patient fortunately did not have [name] total contact cast or an Apligraf and on the left foot. She has been using Keystone ABD pads and kerlix and her own running shoes She arrives in clinic today with thick callus and [name] very poor  surface on the left foot on the right nonviable skin subcutaneous tissue and [name] deep probing hole. 11/15; patient missed her last clinic appointment. She states she has not been dressing the wound beds for the past 2 weeks. She states that at she had [name] new roommate but is now going back to live with her mother. Apparently its been [name] distracting 2 weeks. Patient currently denies signs of infection. 11/22; patient presents for follow-up. She states she has been using Keystone antibiotic and Dakin's wet-to-dry dressings to the wound beds. She is agreeable for cast placement today. We had ordered Apligraf  however this has not been received by our facility. 11/29; Patient had [name] total contact cast placed at last clinic visit and she tolerated this well. We were using silver alginate under the cast. Patient's been using Keystone antibiotic with Aquacel to the right plantar foot wound. She has no issues or complaints today. Apligraf is available for placement today. Patient would like to proceed with this. 12/6; patient presents for follow-up. She had Apligraf placed in standard fashion last clinic visit under the total contact cast to the left lower extremity. She has been using Keystone antibiotic and Aquacel to the right plantar foot wound. She has no issues or complaints today. 12/13; patient presents for follow-up. She has finished 5 Apligraf placements. Was told she would not qualify for more. We have been doing [name] total contact cast to the left lower extremity. She has been using Keystone antibiotic and Aquacel to the right plantar foot wound. She has no issues or complaints today. Electronic Signature(s) Signed: 10/05/2022 11:46:52 AM By: Heather Shan DO Entered By: Heather [name] on 10/05/2022 10:46:15 -------------------------------------------------------------------------------- Physical Exam Details Patient Name: Date of Service: Michel Harrow 10/05/2022 9:45 [name] Medical Record  Number: [id number] Patient Account Number: [id number] Date of Birth/Sex: Treating RN: May 03, 1976 (46 y.o. Heather [name] Primary Care Provider: Raelene [name] Other Clinician: Massie [name] Referring Provider: Treating Provider/Extender: Heather [name] in Treatment: 69 Constitutional . Cardiovascular . RUBIE, FICCO [name] ([id number]) [id number] Page 9 of 20 Psychiatric . Notes Right foot: T the plantar heel there is an incision site with increased depth.T the opening there is Nonviable tissue and granulation tissue. Left foot: T the o o o medial aspect there is an open wound with granulation tissue and nonviable tissue with surrounding callus. No overt signs of infection to any of the wound beds. Electronic Signature(s) Signed: 10/05/2022 11:46:52 AM By: Heather Shan DO Entered By: Heather [name] on 10/05/2022 10:48:21 -------------------------------------------------------------------------------- Physician Orders Details Patient Name: Date of Service: Heather Hitch [name] 10/05/2022 9:45 [name]: [id number] Patient Account Number: [id number] Date of Birth/Sex: Treating RN: July 06, 1976 (46 y.o. Heather [name] Primary Care Provider: Raelene [name] Other Clinician: Massie [name] Referring Provider: Treating Provider/Extender: Heather [name] in Treatment: 52 Verbal / Phone Orders: No Diagnosis Coding Follow-up Appointments Return Appointment in 1 week. Nurse Visit as needed Bathing/ Shower/ Hygiene May shower with wound dressing protected with water repellent cover or cast protector. No tub bath. Anesthetic (Use 'Patient Medications' Section for Anesthetic Order Entry) Lidocaine applied to wound bed Edema Control - Lymphedema / Segmental Compressive Device / Other Elevate, Exercise Daily and [name] void Standing for Long Periods of Time. Elevate legs to the level of the heart and  pump ankles as often as possible Elevate leg(s) parallel to the floor when sitting. Off-Loading Total Contact Cast to Left Lower Extremity Total Contact Cast to Right Lower Extremity - applied to left foot Open toe surgical shoe - Heel off loader - Right foot Other: - keep pressure off of feet. Medications-Please add to medication list. Wound #12 Left,Medial,Plantar Foot Keystone Compound - Keystone bilat feet Wound Treatment Wound #11 - Foot Wound Laterality: Plantar, Right Cleanser: Dakin 16 (oz) 0.25 1 x Per Day/30 Days Discharge Instructions: Use as directed. Cleanser: Soap and Water 1 x Per Day/30 Days Discharge Instructions: Gently cleanse wound with antibacterial soap, rinse and pat dry prior to dressing wounds Topical: keystone gel 1 x Per Day/30 Days Prim Dressing: Aquacel Extra  Hydrofiber Dressing, 2x2 (in/in) ary 1 x Per Day/30 Days Secondary Dressing: ABD Pad 5x9 (in/in) (Generic) 1 x Per Day/30 Days ALARA, [name] [name] ([id number]) [id number] Page 10 of 20 Discharge Instructions: Cover with ABD pad Secured With: Medipore T - 62M Medipore H Soft Cloth Surgical T ape ape, 2x2 (in/yd) (Generic) 1 x Per Day/30 Days Secured With: Kerlix Roll Sterile or Non-Sterile 6-ply 4.5x4 (yd/yd) (Generic) 1 x Per Day/30 Days Discharge Instructions: Apply Kerlix as directed Wound #12 - Foot Wound Laterality: Plantar, Left, Medial Cleanser: Dakin 16 (oz) 0.25 (Dispense As Written) 1 x Per Day/30 Days Discharge Instructions: Use as directed. Topical: Vitamin AandD Ointment, 4(oz) tube 1 x Per Day/30 Days Discharge Instructions: apply to red, broken down area Prim Dressing: Hydrofera Blue Ready Transfer Foam, 2.5x2.5 (in/in) 1 x Per Day/30 Days ary Discharge Instructions: Apply Hydrofera Blue Ready to wound bed as directed Prim Dressing: Keystone gel ary 1 x Per Day/30 Days Secondary Dressing: ABD Pad 5x9 (in/in) (Generic) 1 x Per Day/30 Days Discharge  Instructions: Cover with ABD pad Secured With: Medipore T - 62M Medipore H Soft Cloth Surgical T ape ape, 2x2 (in/yd) (Generic) 1 x Per Day/30 Days Secured With: Kerlix Roll Sterile or Non-Sterile 6-ply 4.5x4 (yd/yd) (Generic) 1 x Per Day/30 Days Discharge Instructions: Apply Kerlix as directed Electronic Signature(s) Signed: 10/05/2022 11:46:52 AM By: Heather Shan DO Entered By: Heather [name] on 10/05/2022 10:51:22 -------------------------------------------------------------------------------- Problem List Details Patient Name: Date of Service: Heather Hitch [name] 10/05/2022 9:45 [name]: [id number] Patient Account Number: [id number] Date of Birth/Sex: Treating RN: August 22, 1976 (46 y.o. 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 03/16/2022 No Yes E11.621 Type 2 diabetes mellitus with foot ulcer 03/16/2022 No Yes E11.42 Type 2 diabetes mellitus with diabetic polyneuropathy 03/16/2022 No Yes [place], Christyn [name] ([id number]) [id number] Page 11 of 20 L97.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 03/16/2022 No Yes of skin Inactive Problems Resolved Problems Electronic Signature(s) Signed: 10/05/2022 11:46:52 AM By: Heather Shan DO Entered By: Heather [name] on 10/05/2022 10:44:37 -------------------------------------------------------------------------------- Progress Note Details Patient Name: Date of Service: Heather Hitch [name] 10/05/2022 9:45 [name]:  [id number] Patient Account Number: [id number] Date of Birth/Sex: Treating RN: Feb 21, 1976 (46 y.o. Heather [name] Primary Care Provider: Raelene [name] Other Clinician: Massie [name] Referring Provider: Treating Provider/Extender: Heather [name] in Treatment: 29 Subjective Chief Complaint Information obtained from Patient 03/19/2021; patient referred by Dr. Luana [name] who has been looking after her left foot for quite [name] period of time for review of [name] nonhealing area in the left midfoot 03/12/2022; bilateral feet wounds and right lower extremity wound. History of Present Illness (HPI) 01/18/18-She is here for initial evaluation of the left great toe ulcer. She is [name] poor historian in regards to timeframe in detail. She states approximately 4 weeks ago she lacerated her toe on something in the house. She followed up with her primary care who placed her on Bactrim and ultimately [name] second dose of Bactrim prior to coming to wound clinic. She states she has been treating the toe with peroxide, Betadine and [name]  Band-Aid. She did not check her blood sugar this morning but checked it yesterday morning it was 327; she is unaware of [name] recent A1c and there are no current records. She saw Dr. she would've orthopedics last week for an old injury to the left ankle, she states he did not see her toe, nor did she bring it to his attention. She smokes approximately 1 pack cigarettes [name] day. Her social situation is concerning, she arrives this morning with her mother who appears extremely intoxicated/under the influence; her mother was asked to leave the room and be monitored by the patient's grandmother. The patient's aunt then accompanied the patient and the room throughout the rest of the appointment. We had [name] lengthy discussion regarding the deleterious effects of uncontrolled hyperglycemia and smoking as it relates to wound healing and overall health. She was strongly encouraged to decrease her  smoking and get her diabetes under better control. She states she is currently on [name] diet and has cut down her [facility] consumption. The left toe is erythematous, macerated and slightly edematous with malodor present. The edema in her left foot is below her baseline, there is no erythema streaking. We will treat her with Santyl, doxycycline; we have ordered and xray, culture and provided [name] Peg assist surgical shoe and cultured the wound. 01/25/18-She is here in follow-up evaluation for [name] left great toe ulcer and presents with an abscess to her suprapubic area. She states her blood sugars remain elevated, feeling "sick" and if levels are below 250, but she is trying. She has made no attempt to decrease her smoking stating that we "can't take away her food in her cigarettes". She has been compliant with offloading using the PEG assist you. She is using Santyl daily. the culture obtained last week grew staph aureus and Enterococcus faecalis; continues on the doxycycline and Augmentin was added on Monday. The suprapubic area has erythema, no femoral variation, purple discoloration, minimal induration, was accessed with [name] cotton tip applicator with sanguinopurulent drainage, this was cultured, I suspect the current antibiotic treatment will cover and we will not add anything to her current treatment plan. She was advised to go to urgent care or ER with any change in redness, induration or fever. 02/01/18-She is here in follow-up evaluation for left great toe ulcers and [name] new abdominal abscess from last week. She was able to use packing until earlier this week, where she "forgot it was there". She states she was feeling ill with GI symptoms last week and was not taking her antibiotic. She states her glucose levels have been predominantly less than 200, with occasional levels between 200-250. She thinks this was contributing to her GI symptoms as they have resolved without intervention. There continues to be  significant laceration to left toe, otherwise it clinically looks stable/improved. There is now less superficial opening to the lateral aspect of the great toe that was residual blister. We will transition to [name] to all wounds, she will continue her Augmentin. If there is no change or deterioration next week for reculture. 02/08/18-She is here in follow-up evaluation for left great toe ulcer and abdominal ulcer. There is an improvement in both wounds. She has been wrapping her left toe with coban, not by our direction, which has created an area of discoloration to the medial aspect; she has been advised to NOT use coban secondary to her neuropathy. She states her glucose levels have been high over this last week ranging from 200-350, she continues  to smoke. She admits to being less compliant with her offloading shoe. We will continue with same treatment plan and she will follow-up next week. 02/15/18-She is here in follow-up evaluation for left great toe ulcer and abdominal ulcer. The abdominal ulcer is epithelialized. The left great toe ulcer is improved and all injury from last week using the Coban wrap is resolved, the lateral ulcer is healed. She admits to noncompliance with wearing offloading shoe and admits to glucose levels being greater than 300 most of the week. She continues to smoke and expresses no desire to quit. There is one area medially that probes deeper than it has historically, erythema to the toe and dorsal foot has consistently waxed and waned. There is no overt signs of cellulitis or infection ANALIA, ZUK [name] ([id number]) [id number] Page 12 of 20 but we will culture the wound for any occult infection given the new area of depth and erythema. We will hold off on sensitivities for initiation of antibiotic therapy. 02/22/18-She is here in follow up evaluation for left great toe ulcer. There is overall significant improvement in both wound  appearance, erythema and edema with changes made last week. She was not initiated on antibiotic therapy. Culture obtained last week showed oxacillin sensitive staph aureus, sensitive to clindamycin. Clindamycin has been called into the pharmacy but she has been instructed to hold off on initiation secondary to overall clinical improvement and her history of antibiotic intolerance. She has been instructed to contact the clinic with any noted changes/deterioration and the wound, erythema, edema and/or pain. She will follow-up next week. She continues to smoke and her glucose levels remain elevated >250; she admits to compliance with offloading shoe 03/01/18 on evaluation today patient appears to be doing fairly well in regard to her left first toe ulcer. She has been tolerating the dressing changes with the [facility] Dressing without complication and overall this has definitely showed signs of improvement according to records as well is what the patient tells me today. I'm very pleased in that regard. She is having no pain today 03/08/18 She is here for follow up evaluation of [name] left great toe ulcer. She remains non-compliant with glucose control and smoking cessation; glucose levels consistently >200. She states that she got new shoe inserts/peg assist. She admits to compliance with offloading. Since my last evaluation there is significant improvement. We will switch to prisma at this time and she will follow up next week. She is noted to be tachycardic at this appointment, heart rate 120s; she has [name] history of heart rate 70-130 according to our records. She admits to extreme agitation r/t personal issues; she was advised to monitor her heartrate and contact her physician if it does not return to [name] more normal range (<100). She takes cardizem twice daily. 03/15/18-She is here in follow-up evaluation for left great toe ulcer. She remains noncompliant with glucose control and smoking cessation. She admits  to compliance with wearing offloading shoe. The ulcer is improved/stable and we will continue with the same treatment plan and she will follow-up next week 03/22/18-She is here for evaluation for left great toe ulcer. There continues to be significant improvement despite recurrent hyperglycemia (over 500 yesterday) and she continues to smoke. She has been compliant with offloading and we will continue with same treatment plan and she will follow-up next week. 03/29/18-She is here for evaluation for left great toe ulcer. Despite continuing to smoke and uncontrolled diabetes she continues to improve. She is compliant with offloading  shoe. We will continue with the same treatment plan and she will follow-up next week 04/05/18- She is here in follow up evaluation for [name] left great toe ulcer; she presents with small pustule to left fifth toe (resembles ant bite). She admits to compliance with wearing offloading shoe; continues to smoke or have uncontrolled blood glucose control. There is more callus than usual with evidence of bleeding; she denies known trauma. 04/12/18-She is here for evaluation of left great toe ulcer. Despite noncompliance with glycemic control and smoking she continues to make improvement. She continues to wear offloading shoe. The pustule, that was identified last week, to the left fifth toe is resolved. She will follow-up in 2 weeks 05/03/18-she is seen in follow-up evaluation for [name] left great toe ulcer. She is compliant with offloading, otherwise noncompliant with glycemic control and smoking. She has plateaued and there is minimal improvement noted. We will transition to [facility], replaced the insert to her surgical shoe and she will follow-up in one week 05/10/18- She is here in follow up evaluation for [name] left great toe ulcer. It appears stable despite measurement change. We will continue with same treatment plan and follow up next week. 05/24/18-She is seen in follow-up evaluation  for [name] left great toe ulcer. She remains compliant with offloading, has made significant improvement in her diet, decreasing the amount of sugar/soda. She said her recent A1c was 10.9 which is lower than. She did see [name] diabetic nutritionist/educator yesterday. She continues to smoke. We will continue with the same treatment plan and she'll follow-up next week. 05/31/18- She is seen in follow-up evaluation for left great toe ulcer. She continues to remain compliant with offloading, continues to make improvement in her diet, increasing her water and decreasing the amount of sugar/soda. She does continue to smoke with no desire to quit. We will apply Prisma to the depth and Hydrofera Blue over. We have not received insurance authorization for oasis. She will follow up next week. 06/07/18-She is seen in follow-up evaluation for left great toe ulcer. It has stalled according to today's measurements although base appears stable. She says she saw [name] diabetic educator yesterday; her average blood sugars are less than 300 which is an improvement for her. She continues to smoke and states "that's my next step" She continues with water over soda. We will order for xray, culture and reinstate ace wrap compression prior to placing apligraf for next week. She is voicing no complaints or concerns. Her dressing will change to iodoflex over the next week in preparation for apligraf. 06/14/18-She is seen in follow-up evaluation for left great toe ulcer. Plain film x-ray performed last week was negative for osteomyelitis. Wound culture obtained last week grew strep B and OSSA; she is initiated on keflex and cefdinir today; there is erythema to the toe which could be from ace wrap compression, she has [name] history of wrapping too tight and has has been encouraged to maintain ace wraps that we place today. We will hold off on application of apligraf today, will apply next week after antibiotic therapy has been initiated. She admits  today that she has resumed taking [name] shower with her foot/toe submerged in water, she has been reminded to keep foot/toe out of the bath water. She will be seen in follow up next week 06/21/18-she is seen in follow-up evaluation for left great toe ulcer. She is tolerating antibiotic therapy with no GI disturbance. The wound is stable. Apligraf was applied today. She has been  decreasing her smoking, only had 4 cigarettes yesterday and 1 today. She continues being more compliant in diabetic diet. She will follow-up next week for evaluation of site, if stable will remove at 2 weeks. 06/28/18- She is here in follow up evalution. Apligraf was placed last week, she states the dressing fell off on Tuesday and she was dressing with hydrofera blue. She is healed and will be discharged from the clinic today. She has been instructed to continue with smoking cessation, continue monitoring glucose levels, offloading for an additional 4 weeks and continue with hydrofera blue for additional two weeks for any possible microscopic opening. Readmission: 08/07/18 on evaluation today patient presents for reevaluation concerning the ulcer of her right great toe. She was previously discharged on 06/28/18 healed. Nonetheless she states that this began to show signs of drainage she subsequently went to her primary care provider. Subsequently an x-ray was performed on 08/01/18 which was negative. The patient was also placed on antibiotics at that time. Fortunately they should have been effective for the infection. Nonetheless she's been experiencing some improvement but still has [name] lot of drainage coming from the wound itself. 08/14/18 on evaluation today patient's wound actually does show signs of improvement in regard to the erythema at this point. She has completed the antibiotics. With that being said we did discuss the possibility of placing her in [name] total contact cast as of today although I think that I may want to give this  just [name] little bit more time to ensure nothing recurrence as far as her infection is concerned. I do not want to put in the cast and risk infection at that time if things are not completely resolved. With that being said she is gonna require some debridement today. 08/21/18 on evaluation today patient actually appears to be doing okay in regard to her toe ulcer. She's been tolerating the dressing changes without complication. With that being said it does appear that she is ready and in fact I think it's appropriate for [place] to go ahead and initiate the total contact cast today. Nonetheless she will require some sharp debridement to prepare the wound for application. Overall I feel like things have been progressing well but we do need to do something to get this to close more readily. 08/24/18 patient seen today for reevaluation after having had the total contact cast applied on Tuesday. She seems to have done very well the wound appears to be doing great and overall I'm pleased with the progress that she's made. There were no abnormal areas of rubbing from the cast on her lower extremity. 08/30/18 on evaluation today patient actually appears to be completely healed in regard to her plantar toe ulcer. She tells me at this point she's been having [name] lot of issues with the cast. She almost fell [name] couple of times the state shall the step of her dog [name] couple times as well. This is been [name] very frustrating process for her other nonetheless she has completely healed the wound which is excellent news. Overall there does not appear to be the evidence of infection at this time which is great news. 09/11/18 evaluation today patient presents for follow-up concerning her great toe ulcer on the left which has unfortunately reopened since I last saw her which was only [name] couple of weeks ago. Unfortunately she was not able to get in to get the shoe and potentially the AFO that's gonna be necessary due to her left foot drop.  She  continues with offloading shoe but this is not enough to prevent her from reopening it appears. When we last had her in the total contact cast she did well from [name] healing standpoint but unfortunately the wound reopened as soon as she came out of the cast within just [name] couple of weeks. 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 This is also part of the reason why she falls on [name] regular basis. I just do not believe that is gonna be safe for [place] to reinitiate the total contact cast as last time we had this on she fell 3 times one week which is definitely not normal for her. 09/18/18 upon evaluation today the patient actually appears to be doing about the same in regard to her toe ulcer. She did not contact [facility] as I asked her to even though I had given her the prescription. In fact she actually states that she has no idea where the prescription is. She did apparently call [facility] and they told her that all she needed to do was bring the prescription in order to be able to be seen and work on getting the AFO for her left foot. With all that being said she still does not have an appointment and I'm not sure were things stand that regard. I will give her [name] new prescription today in order to contact them to [place]AKER[place], STARKMAN [name] ([id number]) [id number] Page 13 of 20 get this set up. 09/25/18 on evaluation today patient actually appears to be doing about the same in regard to her toes ulcer. She does have [name] small areas which seems to have [name] lot of callous buildup around the edge of the wound which is going to need sharp debridement today. She still is waiting to be scheduled for evaluation with [facility] for possibility of an AFO. She states there supposed to call her tomorrow to get this  set up. Unfortunately it does appear that her foot specifically the toe area is showing signs of erythema. There does not appear to be any systemic infection which is in these good news. 10/02/18 on evaluation today patient actually appears to be doing about the same in regard to her toe ulcer. This really has not done too well although it's not significantly larger it's also not significantly smaller. She has been tolerating the dressing changes without complication. She actually has her appointment with [facility] and [facility] tomorrow to hopefully be measured for obtaining and AFO splint. I think this would be helpful preventing this from reoccurring. We had contemplated starting the cast this week although to be honest I am reluctant to do that as she's been having nausea, vomiting, and seizure activity over the past three days. She has [name] history of seizures and have been told is nothing that can be done for these. With that being said I do believe that along with the seizures have the nausea vomiting which upon further questioning doesn't seem to be the normal for her and makes me concerned for the possibility of infection or something else going on. I discussed this with the patient and her mother during the office visit today. I do not feel the wound is effective but maybe something else. The responses this was "this just happens to her at times  and we don't know why". They did not seem to be interested in going to the hospital to have this checked out further. 10/09/18 on evaluation today patient presents for follow-up concerning her ongoing toe ulcer. She has been tolerating the dressing changes without complication. Fortunately there does not appear to be any evidence of infection which is great news however I do think that the patient would benefit from going ahead for with the total contact cast. She's actually in [name] wheelchair today she tells me that she will use her walker if we initiate the  cast. I was very specific about the fact that if we were gonna do the cast I wanted to make sure that she was using the walker in order to prevent any falls. She tells me she does not have stairs that she has to traverse on [name] regular basis at her home. She has not had any seizures since last week again that something that happens to her often she tells me she did talk to [employer] and they said that it may take up to three weeks to get the brace approved for her. Hopefully that will not take that long but nonetheless in the meantime I do think the cast could be of benefit. 10/12/18 on evaluation today patient appears to be doing rather well in regard to her toe ulcer. It's just been [name] few days and already this is significantly improved both as far as overall appearance and size. Fortunately there's no sign of infection. She is here for her first obligatory cast change. 10/19/18 Seen today for follow up and management of left great toe ulcer. Wound continues to show improvement. Noted small open area with seroussang drainage with palpation. Denies any increased pain or recent fevers during visit. She will continue calcium alginate with offloading shoe. Denies any questions or concerns during visit. 10/26/18 on evaluation today patient appears to be doing about the same as when I last saw her in regard to her wound bed. Fortunately there does not appear to be any signs of infection. Unfortunately she continues to have [name] breakdown in regard to the toe region any time that she is not in the cast. It takes almost no time at all for this to happen. Nonetheless she still has not heard anything from the brace being made by [employer] as to when exactly this will be available to her. Fortunately there is no signs of infection at this time. 10/30/18 on evaluation today patient presents for application of the total contact cast as we just received him this morning. Fortunately we are gonna be able to apply this to her  today which is great news. She continues to have no significant pain which is good news. Overall I do feel like things have been improving while she was the cast is when she doesn't have [name] cast that things get worse. She still has not really heard anything from [facility] regarding her brace. 11/02/18 upon evaluation today patient's wound already appears to be doing significantly better which is good news. Fortunately there does not appear to be any signs of infection also good news. Overall I do think the total contact cast as before is helping to heal this area unfortunately it's just not gonna likely keep the area closed and healed without her getting her brace at least. Again the foot drop is [name] significant issue for her. 11/09/18 on evaluation today patient appears to be doing excellent in regard to her toe ulcer which in fact  is completely healed. Fortunately we finally got the situation squared away with the paperwork which was needed to proceed with getting her brace approved by Medicaid. I have filled that out unfortunately that information has been sent to the orthopedic office that I worked at 2 1/2 years ago and not tired Current wound care measures. Fortunately she seems to be doing very well at this time. 11/23/18 on evaluation today patient appears to be doing More Poorly Compared to Last Time I Saw Her. At [facility] She Had Completely Healed. Currently she is continuing to have issues with reopening. She states that she just found out that the brace was approved through Medicaid now she just has to go get measured in order to have this fitted for her and then made. Subsequently she does not have an appointment for this yet that is going to complicate things we obviously cannot put her back in the cast if we do not have everything measured because they're not gonna be able to measure her foot while she is in the cast. Unfortunately the other thing that I found out today as well is that she was  in the hospital over the weekend due to having [name] heroin overdose. Obviously this is unfortunate and does have me somewhat worried as well. 11/30/18 on evaluation today patient's toe ulcer actually appears to be doing fairly well. The good news is she will be getting her brace in the shoes next week on Wednesday. Hopefully we will be able to get this to heal without having to go back in the cast however she may need the cast in order to get the wound completely heal and then go from there. Fortunately there's no signs of infection at this time. 12/07/18 on evaluation today patient fortunately did receive her brace and she states she could tell this definitely makes her walk better. With that being said she's been having issues with her toe where she noticed yesterday there was [name] lot of tissue that was loosing off this appears to be much larger than what it was previous. She also states that her leg has been read putting much across the top of her foot just about the ankle although this seems to be receiving somewhat. The total area is still red and appears to be someone infected as best I can tell. She is previously taken Bactrim and that may be [name] good option for her today as well. We are gonna see what I wound culture shows as well and I think that this is definitely appropriate. With that being said outside of the culture I still need to initiate something in the interim and that's what I'm gonna go ahead and select Bactrim is [name] good option for her. 12/14/18 on evaluation today patient appears to be doing better in regard to her left great toe ulcer as compared to last week's evaluation. There's still some erythema although this is significantly improved which is excellent news. Overall I do believe that she is making good progress is still gonna take some time before she is where I would like her to be from the standpoint of being able to place her back into the total contact cast. Hopefully we will be  where we need to be by next week. 12/21/18 on evaluation today patient actually appears to be doing poorly in regard to her toe ulcer. She's been tolerating the dressing changes without complication. Fortunately there's no signs of systemic infection although she does have [name] lot  of drainage from the toe ulcer and this does seem to be causing some issues at this point. She does have erythema on the distal portion of her toe that appears to be likely cellulitis. 12/28/18 on evaluation today patient actually appears to be doing [name] little better in my pinion in regard to her toe ulcer. With that being said she still does have some evidence of infection at this time and for her culture she had both E. coli as well as enterococcus as organisms noted on evaluation. For that reason I think that though the Keflex likely has treated the E. coli rather well this has really done nothing for the enterococcus. We are going to have to initiate treatment for this specifically. 01/04/19 on evaluation today patient's toe actually appears to be doing better from the standpoint of infection. She currently would like to see about putting the cash back on I think that this is appropriate as long as she takes care of it and keeps it from getting wet. She is gonna have some drainage we can definitely pass this up with Drawtex and alginate to try to prevent as much drainage as possible from causing the problems. With that being said I do want to at least try her with the cast between now and Tuesday. If there any issues we can't continue to use it then I will discontinue the use of the cast at that point. 01/08/19 on evaluation today patient actually appears to be doing very well as far as her foot ulcer specifically the great toe on the left is concerned. She did have an area of rubbing on the medial aspect of her left ankle which again is from the cast. Fortunately there's no signs of infection at this point in this appears to be  [name] very slight skin breakdown. The patient tells me she felt it rubbing but didn't think it was that bad. Fortunately there is no signs of active infection at this time which is good news. No fevers, chills, nausea, or vomiting noted at this time. Heather [name], Heather [name] ([id number]) [id number] Page 14 of 20 01/15/19 on evaluation today patient actually appears to be doing well in regard to her toe ulcer. Again as previous she seems to do well and she has the cast on which indicates to me that during the time she doesn't have [name] cast on she's putting way too much pressure on this region. Obviously I think that's gonna be an issue as with the current national emergency concerning the Covid-19 Virus it has been recommended that we discontinue the use of total contact casting by the chief medical officer of our company, Dr. Simona Huh. The reasoning is that if [name] patient becomes sick and cannot come into have the cast removed they could not just leave this on for an additional two weeks. Obviously the hospitals also do not want to receive patient's who are sick into the emergency department to potentially contaminate the region and spread the Covid-19 Virus among other sick individuals within the hospital system. Therefore at this point we are suspending the use of total contact cast until the current emergency subsides. This was all discussed with the patient today as well. 01/22/19 on evaluation today patient's wound on her left great toe appears to be doing slightly worse than previously noted last week. She tells me that she has been on this quite [name] bit in fact she tells me she's been awake for 38 straight hours. This is due to the  fact that she's having to care for grandparents because nobody else will. She has been taking care of them for five the last seven days since I've seen her they both have dementia his is from [name] stroke and her grandmother's was progressive. Nonetheless she states  even her mom who knows her condition and situation has only help two of those days to take care of them she's been taking care of the rest. Fortunately there does not appear to be any signs of active infection in regard to her toe at this point although obviously it doesn't look as good as it did previous. I think this is directly related to her not taking off the pressure and friction by way of taking things easy. Though I completely understand what's going on. 01/29/19 on evaluation today patient's tools are actually appears to be showing some signs of improvement today compared to last week's evaluation as far as not necessarily the overall size of the wound but the fact that she has some new skin growth in between the two ends of the wound opening. Overall I feel like she has done well she states that she had [name] family member give her what sounds to be [name] CAM walker boot which has been helpful as well. 02/05/19 on evaluation today patient's wound bed actually appears to be doing significantly better in regard to her overall appearance of the size of the wound. With that being said she is still having an issue with offloading efficiently enough to get this to close. Apparently there is some signs of infection at this point as well unfortunately. Previously she's done well of Augmentin I really do not see anything that needs to be culture currently but there theme and cellulitis of the foot that I'm seeing I'm gonna go ahead and place her on an antibiotic today to try to help clear this up. 02/12/2019 on evaluation today patient actually appears to be doing poorly in regard to her overall wound status. She tells me she has been using her offloading shoe but actually comes in today wearing her tennis shoe with the AFO brace. Again as I previously discussed with her this is really not sufficient to allow the area to heal appropriately. Nonetheless she continues to be somewhat noncompliant and I do wonder  based on what she has told my nurse in the past as to whether or not [name] good portion of this noncompliance may be recreational drug and alcohol related. She has had [name] history of heroin overdose and this was fairly recently in the past couple of months that have been seeing her. Nonetheless overall I feel like her wound looks significantly worse today compared to what it was previous. She still has significant erythema despite the Augmentin I am not sure that this is an appropriate medication for her infection I am also concerned that the infection may have gone down into her bone. 02/19/19 on evaluation today patient actually appears to be doing about the same in regard to her toe ulcer. Unfortunately she continues to show signs of bone exposure and infection at this point. There does not appear to be any evidence of worsening of the infection but I'm also not really sure that it's getting significantly better. She is on the Augmentin which should be sufficient for the Staphylococcus aureus infection that she has at this point. With that being said she may need IV antibiotics to more appropriately treat this. We did have [name] discussion today about hyperbaric option  therapy. 02/28/19 on evaluation today patient actually appears to be doing much worse in regard to the wound on her left great toe as compared to even my previous evaluation last week. Unfortunately this seems to be training in [name] pretty poor direction. Her toe was actually now starting to angle laterally and I can actually see the entire joint area of the proximal portion of the digit where is the distal portion of the digit again is no longer even in contact with the joint line. Unfortunately there's [name] lot more necrotic tissue around the edge and the toe appears to be showing signs of becoming gangrenous in my pinion. I'm very concerned about were things stand at this point. She did see infectious disease and they are planning to send in [name]  prescription for Sivextro for her and apparently this has been approved. With that being said I don't think she should avoid taking this but at the same time I'm not sure that it's gonna be sufficient to save her toe at this point. She tells me that she still having to care for grandparents which I think is putting quite [name] bit of strain on her foot and specifically the total area and has caused this to break down even to [name] greater degree than would've otherwise been expected. 03/05/19 on evaluation today patient actually appears to be doing quite well in regard to her toe all things considering. She still has bone exposed but there appears to be much less your thing on overall the appearance of the wound and the toe itself is dramatically improved. She still does have some issues currently obviously with infection she did see vascular as well and there concerned that her blood flow to the toad. For that reason they are setting up for an angiogram next week. 03/14/19 on evaluation today patient appears to be doing very poor in regard to her toe and specifically in regard to the ulceration and the fact that she's starting to notice the toe was leaning even more towards the lateral aspect and the complete joint is visible on the proximal aspect of the joint. Nonetheless she's also noted [name] significant odor and the tip of the toe is turning more dark and necrotic appearing. Overall I think she is getting worse not better as far as this is concerned. For that reason I am recommending at this point that she likely needs to be seen for likely amputation. READMISSION 03/19/2021 This is [name] patient that we cared for in this clinic for [name] prolonged period of time in 2019 and 2020 with [name] left foot and left first toe wound. 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 Luana [name]. In December 2021 she had  an ulcer on her right great toe as well as the fourth and fifth toes. She underwent [name] partial ray amputation of the right fourth and fifth toes. She also had an angiogram at that time and underwent angioplasty of the right anterior tibial artery. In any case she claims that the wound on the right foot is closed I did not look at this today which was probably an oversight although I think that should be done next week. After her surgery she developed [name] dehiscence but I do not see any follow-up of this. According to Dr. Deborra Medina last review that she was out of the area being cared for by another physician  but recently came back to his attention. 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 [name] culture of this area showed E. coli apparently before she came back to see Dr. Luana [name] she was supposed to be receiving antibiotics but she did not really take them. Nor is she offloading this area at all. Finally her last hemoglobin A1c listed in epic was in March 2022 at 14.1 she says things are [name] lot better since then although I am not sure. 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 She had this wound at the time. During this admission she had E. coli sepsis [name] TEE was negative. 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 She is still [name] heavy cigarette smoker. She has [name] history of bipolar disorder seizure disorder ABI in our clinic was 1.05 6/1; left midfoot in the setting of [name] TMA done previously. Round circular wound with [name] "knuckle" of protruding tissue. The problem is that the knuckle was not attached to any of the surrounding granulation and this probed proximally  widely I removed [name] large portion of this tissue. This wound goes with considerable undermining laterally. I do not feel any bone there was no purulence but this is [name] deep wound. 6/8; in spite of the debridement I did last week. She arrives with [name] wound looking exactly the same. [name] protruding "knuckle" of tissue nonadherent to most of the surrounding tissue. There is considerable depth around this from 6-12 o'clock at 2.7 cm and undermining of 1 cm. This does not look overtly infected and the x- ray I did last week was negative for any osseous abnormalities. We have been using silver collagen 6/15; deep tissue culture I did last week showed moderate staph aureus and moderate Pseudomonas. This will definitely require prolonged antibiotic therapy. The pathology on the protuberant area was negative for malignancy fungus etc. 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 We have been using silver collagen. I am going to be prescribing Levaquin for 2 weeks. Her CT scan of the foot is down for 7/5 6/22; CT scan of the foot on 7 5. She says she has hardware in the left leg from her previous fracture. She is on the Levaquin for the deep tissue culture I did that showed methicillin sensitive staph aureus and Pseudomonas. I gave her [name] 2-week supply and she will have another week. She arrives in clinic today with the same protuberant tissue however this is nonadherent to the tissue surrounding it. I am really at [name] loss to explain this unless there is underlying deep tissue infection Heather [name], Heather [name] ([id number]) [id number] Page 15 of 20 6/29; patient presents for 1 week follow-up. She has been using collagen to the wound bed. She reports taking her antibiotics as prescribed.She has no complaints or issues today. She denies signs of infection. 7/6; patient presents for one week followup. She has been using collagen to the wound bed.  She states she is taking Levaquin however at times she is not able to keep it down. She denies signs of infection. 7/13; patient presents for 1 week follow-up. She has been using silver alginate to the wound bed. She still has nausea when taking  her antibiotics. She denies signs of infection. 7/20; patient presents for 1 week follow-up. She has been using silver alginate with gentamicin cream to the wound bed. She denies any issues and has no complaints today. She denies signs of infection. 7/27; patient presents for 1 week follow-up. She continues to use silver alginate with gentamicin cream to the wound bed. She reports starting her antibiotics. She has no issues or complaints. Overall she reports stability to the wound. 8/3; patient presents for 1 week follow-up. She has been using silver alginate with gentamicin cream to the wound bed. She reports completing all antibiotics. She has no issues or complaints today. She denies signs of infection. 8/17; patient presents for 2-week follow-up. He is to use silver alginate to the wound bed. She has no issues or complaints today. She denies signs of infection. She reports her pain has improved in her foot since last clinic visit 8/24; patient presents for 1 week follow-up. She continues to use silver alginate to the wound bed. She has no issues or complaints. She denies signs of infection. Pain is stable. 9/7; patient presents for follow-up. She missed her last week appointment due to feeling ill. She continues to use silver alginate. She has [name] new wound to the right lower extremity that is covered in eschar. She states It occurred over the past week and has no idea how it started. She currently denies signs of infection. 9/14; patient presents for follow-up. T the left foot wound she has been using gentamicin cream and silver alginate. T the right lower extremity wound she has o o been keeping this covered and has not obtain Santyl. 9/21; patient  presents for follow-up. She reports using gentamicin cream and silver alginate to the left foot and Santyl to the right lower extremity wound. She has no issues or complaints today. She denies signs of infection. 9/28; patient presents for follow-up. She reports [name] new wound to her right heel. She states this occurred [name] few days ago and is progressively gotten worse. She has been trying to clean the area with [name] Q-tip and Santyl. She reports stability in the other 2 wounds. She has been using gentamicin cream and silver alginate to the left foot and Santyl to the right lower extremity wound. 10/12; patient presents for follow-up. She reports improvement to the wound beds. She is seeing vein and vascular to discuss the potential of [name] left BKA. She states they are going to do an arteriogram. She continues to use silver alginate with dressing changes to her wounds. 11/2; patient presents for follow-up. She states she has not been doing dressing changes to the wound beds. She states she is not able to offload the areas. She reports chronic pain to her left foot wound. 11/9; patient presents for follow-up. She came in with only socks on. She states she forgot to put on shoes. It is unclear if she is doing any dressing changes. She currently denies systemic signs of infection. 11/16; patient presents for follow-up. She came again only with socks on. She states she does not wear shoes ever. It is unclear if she does dressing changes. She currently denies systemic signs of infection. 11/23; patient presents for follow-up. She wore her shoes today. It still unclear exactly what dressing she is using for each wound but she did states she obtained Dakin's solution and has been using this to the left foot wound. She currently denies signs of infection. 11/30; patient presents for follow-up. She has no  issues or complaints today. She currently denies signs of infection. 12/7; patient presents for follow-up. She has  no issues or complaints today. She has been using Hydrofera Blue to the right heel wound and Dakin solution to the left foot wound. Her right anterior leg wound is healed. She currently denies signs of infection. 12/14; patient presents for follow-up. She has been using Hydrofera Blue to the right heel and Dakin's to the left foot wounds. She has no issues or complaints today. She denies signs of infection. 12/21; patient presents for follow-up. She reports using Hydrofera Blue to the right heel and Dakin's to the left foot wound. She denies signs of infection. 12/28; patient presents for follow-up. She continues to use Dakin's to the left foot wound and Hydrofera Blue to the right heel wound. She denies signs of infection. 1/4; patient presents for follow-up. She has no issues or complaints today. She denies signs of infection. 1/11; patient presents for follow-up. It is unclear if she has been dressing these wounds over the past week. She currently denies signs of infection. 1/18; patient presents for follow-up. She states she has been using Dakin's wet-to-dry dressings to the left foot. She has been using Hydrofera Blue to the right foot foot wound. She states that the anterior right leg wound has reopened and draining serous fluid. She denies signs of infection. 1/25; patient presents for follow-up. She has no issues or complaints today. 2/1; patient presents for follow-up. She has no issues or complaints today. She denies signs of infection. 2/8; patient presents for follow-up. She has lost her surgical shoes. She did not have [name] dressing to the right heel wound. She currently denies signs of infection. 2/15; patient presents for follow-up. She reports more pain to the right heel today. She denies purulent drainage Or fever/chills 2/22; patient presents for follow-up. She reports taking clindamycin over the past week. She states that she continues to have pain to her right heel. She reports  purulent drainage. Readmission 03/16/2022 Ms. Heather [name] is [name] 46 year old female with [name] past medical history of type 2 diabetes, osteomyelitis to her feet, chronic systolic heart failure and bipolar disorder that presents to the clinic for bilateral feet wounds and right lower extremity wound. She was last seen in our clinic on 12/15/2021. At that time she had purulent drainage coming out of her right plantar foot and I recommended she go to the ED. She states she went to [facility] and has been there for the past 3 months. I cannot see the records. She states she had OR debridement and was on several weeks of IV antibiotics while inpatient. Since discharge she has not been taking care of the wound beds. She had nothing on her feet other than socks today. She currently denies signs of infection. 5/31; patient presents for follow-up. She has been using Dakin's wet-to-dry dressings to the wound beds on her feet bilaterally and antibiotic ointment to the right anterior leg wound. She had [name] wound culture done at last clinic visit that showed moderate Pseudomonas aeruginosa sensitive to ciprofloxacin. She currently denies systemic signs of infection. 6/14; patient presents for follow-up. She received Keystone 5 days ago and has been using this on the wound beds. She states that last week she had to go to the hospital because she had increased warmth and erythema to the right foot. She was started on 2 oral antibiotics. She states she has been taking these. She currently denies systemic signs of infection. She  has no issues or complaints today. 6/21; patient presents for follow-up. She states she has been using Keystone antibiotics to the wound beds. She has no issues or complaints today. She denies signs of infection. Heather [name], Heather [name] ([id number]) [id number] Page 16 of 20 6/28; patient presents for follow-up. She has been using Keystone antibiotics to the wound  beds. She has no issues or complaints today. 7/12; patient presents for follow-up. Has been using Keystone antibiotics to the wound beds with calcium alginate. She has no issues or complaints today. She never followed up with her orthopedic surgeon who did the OR debridement to the right foot. We discussed the total contact cast for the left foot and patient would like to do this next week. 7/19; patient presents for follow-up. She has been using Keystone antibiotics with calcium alginate to the wound beds. She has no issues or complaints today. Patient is in agreement to do the total contact cast of the left foot today. She knows to return later this week for the obligatory cast change. 05-13-2022 upon evaluation today patient's wound which she has the cast of the left leg actually appears to be doing significantly better. Fortunately I do not see any signs of active infection locally or systemically which is great news and overall I am extremely pleased with where we stand currently. 7/26; patient presents for follow-up. She has [name] cast in place for the past week. She states it irritated her shin. Other than that she tolerated the cast well. She states she would like [name] break for 1 week from the cast. We have been using Keystone antibiotic and Aquacel to both wound beds. She denies signs of infection. 8/2; patient presents for follow-up. She has been using Keystone and Aquacel to the wound beds. She denies any issues and has no complaints. She is agreeable to have the cast placed today for the left leg. 06-03-2022 upon evaluation today patient appears to be doing well with regard to her wound she saw some signs of improvement which is great news. Fortunately I do not see any evidence of active infection locally or systemically at this time which is great news. No fevers, chills, nausea, vomiting, or diarrhea. 8/16; patient presents for follow-up. She has no issues or complaints today. We have been  using Keystone and Aquacel to the wound beds. The left lower extremity is in [name] total contact cast. She is tolerated this well. 8/23; patient presents for follow-up. She has had the total contact cast on the left leg for the past week. Unfortunately this has rubbed and broken down the skin to the medial foot. She currently denies signs of infection. She has been using Keystone antibiotic to the right plantar foot wound. 8/30; patient presents for follow-up. We have held off on the total contact cast for the left leg for the past week. Her wound on the left foot has improved and the previous surrounding breakdown of skin has epithelialized. She has been using Keystone antibiotic to both wound beds. She has no issues or complaints today. She denies signs of infection. 9/6; patient presents for follow-up. She has ordered her's Keystone antibiotic and this is arriving this week. She has been doing Dakin's wet-to-dry dressings to the wound beds. She denies signs of infection. She is agreeable to the total contact cast today. 9/13; patient presents for follow-up. She states that the cast caused her left leg shin to be sore. She would like to take [name] break from the cast this week.  She has been using Keystone antibiotic to the right plantar foot wound. She denies signs of infection. 9/20; patient presents for follow-up. She has been using Keystone antibiotics to the wound beds with calcium alginate to the right foot wound and Hydrofera Blue to the left foot wound. She is agreeable to having the cast placed today. She has been approved for Apligraf and we will order this for next clinic visit. 9/27; patient presents for follow-up. We have been using Keystone antibiotic with Hydrofera Blue to the left foot wound under [name] total contact cast. T the right o foot wound she has been using Keystone antibiotic and calcium alginate. She declines [name] total contact cast today. Apligraf is available for placement and  she would like to proceed with this. 07-28-2022 upon evaluation today patient appears to be doing well currently in regard to her wound. She is actually showing signs of significant improvement which is great news. Fortunately I do not see any evidence of active infection locally nor systemically at this time. She has been seeing Dr. Heber [place] and to be honest has been doing very well with the cast. Subsequently she comes in today with [name] cast on and we did reapply that today as well. She did not really want to she try to talk me out of that but I explained that if she wanted to heal this is really the right way to go. Patient voiced understanding. In regard to her right foot this is actually [name] lot better compared to the last time I saw her which is also great news. 10/11; patient presents for follow-up. Apligraf and the total contact cast was placed to the left leg at last clinic visit. She states that her right foot wound had burning pain to it with the placement of Apligraf to this area. She has been doing Tolar over this area. She denies signs of infection including increased warmth, erythema or purulent drainage. 11/1; 3-week follow-up. The patient fortunately did not have [name] total contact cast or an Apligraf and on the left foot. She has been using Keystone ABD pads and kerlix and her own running shoes She arrives in clinic today with thick callus and [name] very poor surface on the left foot on the right nonviable skin subcutaneous tissue and [name] deep probing hole. 11/15; patient missed her last clinic appointment. She states she has not been dressing the wound beds for the past 2 weeks. She states that at she had [name] new roommate but is now going back to live with her mother. Apparently its been [name] distracting 2 weeks. Patient currently denies signs of infection. 11/22; patient presents for follow-up. She states she has been using Keystone antibiotic and Dakin's wet-to-dry dressings to the wound beds. She  is agreeable for cast placement today. We had ordered Apligraf however this has not been received by our facility. 11/29; Patient had [name] total contact cast placed at last clinic visit and she tolerated this well. We were using silver alginate under the cast. Patient's been using Keystone antibiotic with Aquacel to the right plantar foot wound. She has no issues or complaints today. Apligraf is available for placement today. Patient would like to proceed with this. 12/6; patient presents for follow-up. She had Apligraf placed in standard fashion last clinic visit under the total contact cast to the left lower extremity. She has been using Keystone antibiotic and Aquacel to the right plantar foot wound. She has no issues or complaints today. 12/13; patient presents for follow-up.  She has finished 5 Apligraf placements. Was told she would not qualify for more. We have been doing [name] total contact cast to the left lower extremity. She has been using Keystone antibiotic and Aquacel to the right plantar foot wound. She has no issues or complaints today. Objective Constitutional Vitals Time Taken: 9:57 AM, Height: 69 in, Weight: 178 lbs, BMI: 26.3, Temperature: 97.6 F, Pulse: 59 bpm, Respiratory Rate: 16 breaths/min, Blood Pressure: 138/85 mmHg. Heather [name], Heather [name] ([id number]) [id number] Page 17 of 20 General Notes: Right foot: T the plantar heel there is an incision site with increased depth.T the opening there is Nonviable tissue and granulation tissue. Left o o foot: T the medial aspect there is an open wound with granulation tissue and nonviable tissue with surrounding callus. No overt signs of infection to any of the o wound beds. Integumentary (Hair, Skin) Wound #11 status is Open. Original cause of wound was Surgical Injury. The date acquired was: 12/01/2021. The wound has been in treatment 29 weeks. The wound is located on the [place]. The wound measures 0.3cm  length x 0.4cm width x 0.6cm depth; 0.094cm^2 area and 0.057cm^3 volume. There is Fat Layer (Subcutaneous Tissue) exposed. There is no tunneling or undermining noted. There is [name] medium amount of serosanguineous drainage noted. The wound margin is distinct with the outline attached to the wound base. There is medium (34-66%) pink, pale granulation within the wound bed. There is [name] medium (34-66%) amount of necrotic tissue within the wound bed. Wound #12 status is Open. Original cause of wound was Pressure Injury. The date acquired was: 03/16/2020. The wound has been in treatment 29 weeks. The wound is located on the [place]. The wound measures 1.6cm length x 1cm width x 0.1cm depth; 1.257cm^2 area and 0.126cm^3 volume. There is Fat Layer (Subcutaneous Tissue) exposed. There is no tunneling or undermining noted. There is [name] medium amount of serous drainage noted. The wound margin is flat and intact. There is large (67-100%) red, pink granulation within the wound bed. There is no necrotic tissue within the wound bed. 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 I debrided nonviable tissue. She has finished her course of 5 applications of Apligraf. At this time I recommended Hydrofera Blue with [facility] antibiotic to the left foot under the total contact cast and Aquacel Ag with [facility] antibiotic ointment to the right plantar foot wound. Continue aggressive offloading to the right foot wound with her surgical shoe and peg assist. Follow-up in 1 week. Procedures Wound  #11 Pre-procedure diagnosis of Wound #11 is an Open Surgical Wound located on the Right,Plantar Foot . There was [name] Excisional Skin/Subcutaneous Tissue Debridement with [name] total area of 0.25 sq cm performed by Heather Shan, MD. With the following instrument(s): Curette to remove Viable and Non-Viable tissue/material. Material removed includes Callus, Subcutaneous Tissue, and Slough. [name] time out was conducted at 10:25, prior to the start of the procedure. [name] Minimum amount of bleeding was controlled with Pressure. The procedure was tolerated well. Post Debridement Measurements: 0.3cm length x 0.4cm width x 0.6cm depth; 0.057cm^3 volume. Character of Wound/Ulcer Post Debridement is  stable. 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 There was [name] Excisional Skin/Subcutaneous Tissue Debridement with [name] total area of 3 sq cm performed by Heather Shan, MD. With the following instrument(s): Curette to remove Viable and Non-Viable tissue/material. Material removed includes Callus, Subcutaneous Tissue, and Slough. [name] time out was conducted at 10:23, prior to the start of the procedure. [name] Minimum amount of bleeding was controlled with Pressure. The procedure was tolerated well. Post Debridement Measurements: 1.6cm length x 1cm width x 0.1cm depth; 0.126cm^3 volume. Character of Wound/Ulcer Post Debridement is stable. Severity of Tissue Post Debridement is: Fat layer exposed. Post procedure Diagnosis Wound #12: Same as Pre-Procedure Pre-procedure diagnosis of Wound #12 is [name] Diabetic Wound/Ulcer of the Lower Extremity located on the Left,Medial,Plantar Foot . There was [name] T Contact otal Cast Procedure by Heather Shan, MD. Post procedure Diagnosis Wound #12: Same as Pre-Procedure Plan Follow-up  Appointments: Return Appointment in 1 week. Nurse Visit as needed Bathing/ Shower/ Hygiene: May shower with wound dressing protected with water repellent cover or cast protector. No tub bath. Anesthetic (Use 'Patient Medications' Section for Anesthetic Order Entry): Lidocaine applied to wound bed Edema Control - Lymphedema / Segmental Compressive Device / Other: Elevate, Exercise Daily and Avoid Standing for Long Periods of Time. Elevate legs to the level of the heart and pump ankles as often as possible Gascoigne, Jamee [name] ([id number]) [id number] Page 18 of 20 Elevate leg(s) parallel to the floor when sitting. Off-Loading: T Contact Cast to Left Lower Extremity otal T Contact Cast to Right Lower Extremity - applied to left foot otal Open toe surgical shoe - Heel off loader - Right foot Other: - keep pressure off of feet. Medications-Please add to medication list.: Wound #12 Left,Medial,Plantar Foot: Keystone Compound - Keystone bilat feet WOUND #11: - Foot Wound Laterality: Plantar, Right Cleanser: Dakin 16 (oz) 0.25 1 x Per Day/30 Days Discharge Instructions: Use as directed. Cleanser: Soap and Water 1 x Per Day/30 Days Discharge Instructions: Gently cleanse wound with antibacterial soap, rinse and pat dry prior to dressing wounds Topical: keystone gel 1 x Per Day/30 Days Prim Dressing: Aquacel Extra Hydrofiber Dressing, 2x2 (in/in) 1 x Per Day/30 Days ary Secondary Dressing: ABD Pad 5x9 (in/in) (Generic) 1 x Per Day/30 Days Discharge Instructions: Cover with ABD pad Secured With: Medipore T - 64M Medipore H Soft Cloth Surgical T ape ape, 2x2 (in/yd) (Generic) 1 x Per Day/30 Days Secured With: Kerlix Roll Sterile or Non-Sterile 6-ply 4.5x4 (yd/yd) (Generic) 1 x Per Day/30 Days Discharge Instructions: Apply Kerlix as directed WOUND #12: - Foot Wound Laterality: Plantar, Left, Medial Cleanser: Dakin 16 (oz) 0.25 (Dispense As Written) 1 x Per Day/30  Days Discharge Instructions: Use as directed. Topical: Vitamin AandD Ointment, 4(oz) tube 1 x Per Day/30 Days Discharge Instructions: apply to red, broken down area Prim Dressing: Hydrofera Blue Ready Transfer Foam, 2.5x2.5 (in/in) 1 x Per Day/30 Days ary Discharge Instructions: Apply Hydrofera Blue Ready to wound bed as directed Prim Dressing: Keystone gel 1 x Per Day/30 Days ary Secondary Dressing: ABD Pad 5x9 (in/in) (Generic) 1 x Per Day/30 Days Discharge Instructions: Cover with ABD pad Secured With: Medipore T - 64M Medipore H Soft Cloth Surgical T ape ape, 2x2 (in/yd) (Generic) 1 x Per Day/30 Days Secured With: Kerlix Roll Sterile or Non-Sterile 6-ply 4.5x4 (yd/yd) (  Generic) 1 x Per Day/30 Days Discharge Instructions: Apply Kerlix as directed 1. In office sharp debridement 2. Hydrofera Blue and Keystone antibiotic ointmentooleft foot wound 3. Aquacel Ag with Keystone antibiotic ointmentooright foot wound 4. T contact cast placed in standard fashion otal 5. Follow-up in 1 week Electronic Signature(s) Signed: 10/05/2022 11:46:52 AM By: Heather Shan DO Entered By: Heather [name] on 10/05/2022 10:50:50 -------------------------------------------------------------------------------- ROS/PFSH Details Patient Name: Date of Service: [name], Berger. 10/05/2022 9:45 [name]: [id number] Patient Account Number: [id number] Date of Birth/Sex: Treating RN: 08-13-1976 (46 y.o. Heather [name] Primary Care Provider: Raelene [name] Other Clinician: Massie [name] Referring Provider: Treating Provider/Extender: Heather [name] in Treatment: 29 Information Obtained From Patient Ear/Nose/Mouth/Throat Medical History: Positive for: Chronic sinus problems/congestion; Middle ear problems Hematologic/Lymphatic Medical History: Positive for: Anemia DORETHEA, STRUBEL [name] ([id number]) [id number] Page 19 of  20 Respiratory Medical History: Positive for: Chronic Obstructive Pulmonary Disease (COPD) Cardiovascular Medical History: Positive for: Congestive Heart Failure - EF 33% Endocrine Medical History: Positive for: Type II Diabetes Time with diabetes: 14 years Treated with: Insulin, Oral agents Blood sugar tested every day: No Blood sugar testing results: Bedtime: 176 Genitourinary Medical History: Positive for: End Stage Renal Disease Integumentary (Skin) Medical History: Positive for: History of pressure wounds Neurologic Medical History: Positive for: Neuropathy - feel and legs HBO Extended History Items Ear/Nose/Mouth/Throat: Ear/Nose/Mouth/Throat: Chronic sinus Middle ear problems problems/congestion Immunizations Pneumococcal Vaccine: Received Pneumococcal Vaccination: No Implantable Devices No devices added Family and Social History Current every day smoker; Marital Status - Single; Alcohol Use: Never; Drug Use: Prior History; Caffeine Use: Daily Electronic Signature(s) Signed: 10/05/2022 11:46:52 AM By: Heather Shan DO Signed: 10/05/2022 3:37:15 PM By: Heather Coria RN Entered By: Heather [name] on 10/05/2022 10:51:35 -------------------------------------------------------------------------------- Total Contact Cast Details Patient Name: Date of Service: Heather Hitch [name] 10/05/2022 9:45 [name]: [id number] Patient Account Number: [id number] FLORENCE, YEUNG ([id number]) [id number] Page 20 of 20 Date of Birth/Sex: Treating RN: 15-Mar-1976 (46 y.o. Heather [name] Primary Care Provider: Other Clinician: Glade Nurse Referring Provider: Treating Provider/Extender: Heather [name] in Treatment: 29 T Contact Cast Applied for Wound Assessment: otal Wound #12 Left,Medial,Plantar Foot Performed By: Physician Heather Shan, MD Post Procedure Diagnosis Same as  Pre-procedure Electronic Signature(s) Signed: 10/05/2022 11:46:52 AM By: Heather Shan DO Signed: 10/11/2022 4:44:06 PM By: Heather [name] Entered By: Heather [name] on 10/05/2022 10:28:32 -------------------------------------------------------------------------------- SuperBill Details Patient Name: Date of Service: Heather Hitch [name] 10/05/2022 Medical Record Number: [id number] Patient Account Number: [id number] Date of Birth/Sex: Treating RN: 1975-11-05 (46 y.o. Heather [name] Primary Care Provider: Raelene [name] Other Clinician: Massie [name] Referring Provider: Treating Provider/Extender: Heather [name] in Treatment: 29 Diagnosis Coding ICD-10 Codes Code Description [phone] 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ity 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.621 Type 2 diabetes mellitus with foot ulcer E11.42 Type 2 diabetes mellitus with diabetic polyneuropathy L97.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 of skin Facility Procedures : CPT4 Code: [id number] Description: 11042 - DEB SUBQ TISSUE 20 SQ CM/< ICD-10 Diagnosis Description 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 seve L97.512 Non-pressure chronic ulcer of other part of right foot with fat layer exposed  E11.621 Type 2 diabetes mellitus with foot ulcer Modifier: rity Quantity: 1 Physician Procedures :  CPT4 Code Description Modifier [id number] 11042 - WC PHYS SUBQ TISS 20 SQ CM ICD-10 Diagnosis Description 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ity L97.512 Non-pressure chronic ulcer of other part of right  foot with fat layer exposed E11.621 Type 2 diabetes mellitus with foot ulcer Quantity: 1 Electronic Signature(s) Signed: 10/05/2022 11:46:52 AM By: Heather Shan  DO Entered By: Heather [name] on 10/05/2022 10:51:09

## 2022-10-11 NOTE — Progress Notes (Signed)
Heather, CRAFTS [name] ([id number]) [id number] Page 1 of 10 Visit Report for 10/05/2022 Arrival Information Details Patient Name: Date of Service: [name], [name] 10/05/2022 9:45 [name]: [id number] Patient Account Number: [id number] Date of Birth/Sex: Treating RN: 1976-05-18 (46 y.o. Orvan Falconer Primary Care Vrishank Moster: Raelene Bott Other Clinician: Massie Kluver Referring Takeshi Teasdale: Treating Bayler Gehrig/Extender: Eddie North in Treatment: 29 Visit Information History Since Last Visit All ordered tests and consults were completed: No Patient Arrived: Wheel Chair Added or deleted any medications: No Arrival Time: 09:56 Any new allergies or adverse reactions: No Transfer Assistance: EasyPivot Patient Lift Had [name] fall or experienced change in No Patient Identification Verified: Yes activities of daily living that may affect Secondary Verification Process Completed: Yes risk of falls: Patient Requires Transmission-Based Precautions: No Signs or symptoms of abuse/neglect since last visito No Patient Has Alerts: No Hospitalized since last visit: No Implantable device outside of the clinic excluding No cellular tissue based products placed in the center since last visit: Has Dressing in Place as Prescribed: Yes Has Footwear/Offloading in Place as Prescribed: Yes Left: T Contact Cast otal Pain Present Now: Yes Electronic Signature(s) Signed: 10/11/2022 4:44:06 PM By: Massie Kluver Entered By: Massie Kluver on 10/05/2022 09:56:40 -------------------------------------------------------------------------------- Clinic Level of Care Assessment Details Patient Name: Date of Service: [name], [name] 10/05/2022 9:45 [name]: [id number] Patient Account Number: [id number] Date of Birth/Sex: Treating RN: 1976-04-26 (46 y.o. Orvan Falconer Primary Care Kaidence Sant: Raelene Bott Other Clinician:  Massie Kluver Referring Ifeoluwa Beller: Treating Riva Sesma/Extender: Eddie North in Treatment: 29 Clinic Level of Care Assessment Items TOOL 1 Quantity Score _0  - 0 Use when EandM and Procedure is performed on INITIAL visit ASSESSMENTS - Nursing Assessment / Reassessment _1  - 0 General Physical Exam (combine w/ comprehensive assessment (listed just below) when performed on new pt. evalsSUMNER, [name] ([id number]) [id number] Page 2 of 10 _2  - 0 Comprehensive Assessment (HX, ROS, Risk Assessments, Wounds Hx, etc.) ASSESSMENTS - Wound and Skin Assessment / Reassessment _3  - 0 Dermatologic / Skin Assessment (not related to wound area) ASSESSMENTS - Ostomy and/or Continence Assessment and Care _4  - 0 Incontinence Assessment and Management _5  - 0 Ostomy Care Assessment and Management (repouching, etc.) PROCESS - Coordination of Care _6  - 0 Simple Patient / Family Education for ongoing care _7  - 0 Complex (extensive) Patient / Family Education for ongoing care _8  - 0 Staff obtains Programmer, systems, Records, T Results / Process Orders est _9  - 0 Staff telephones HHA, Nursing Homes / Clarify orders / etc _10  - 0 Routine Transfer to another Facility (non-emergent condition) _11  - 0 Routine Hospital Admission (non-emergent condition) _12  - 0 New Admissions / Biomedical engineer / Ordering NPWT Apligraf, etc. , _13  - 0 Emergency Hospital Admission (emergent condition) PROCESS - Special Needs _14  - 0 Pediatric / Minor Patient Management _15  - 0 Isolation Patient Management _16  - 0 Hearing / Language / Visual special needs _17  - 0 Assessment of Community assistance (transportation, D/C planning, etc.) _18  - 0 Additional assistance / Altered mentation _19  - 0 Support Surface(s) Assessment (bed, cushion, seat, etc.) INTERVENTIONS - Miscellaneous _20  - 0 External ear exam _21  - 0 Patient Transfer (multiple staff / Civil Service fast streamer / Similar  devices) _22  - 0 Simple Staple / Suture removal (25 or less) _23  - 0 Complex Staple / Suture removal (26 or more) _24  - 0 Hypo/Hyperglycemic Management (do not check if billed separately) _25  -  0 Ankle / Brachial Index (ABI) - do not check if billed separately Has the patient been seen at the hospital within the last three years: Yes Total Score: 0 Level Of Care: ____ Electronic Signature(s) Signed: 10/11/2022 4:44:06 PM By: Massie Kluver Entered By: Massie Kluver on 10/05/2022 10:28:53 -------------------------------------------------------------------------------- Encounter Discharge Information Details Patient Name: Date of Service: [name] 10/05/2022 9:45 [name]: [id number] Patient Account Number: [id number] Date of Birth/Sex: Treating RN: [birth year] (46 y.o. [name], [name] [id number] Page 3 of 10 Primary Care Marceil Welp: Raelene Bott Other Clinician: Massie Kluver Referring Angelia Hazell: Treating Meir Elwood/Extender: Eddie North in Treatment: 29 Encounter Discharge Information Items Post Procedure Vitals Discharge Condition: Stable Temperature (F): 97.6 Ambulatory Status: Wheelchair Pulse (bpm): 59 Discharge Destination: Home Respiratory Rate (breaths/min): 16 Transportation: Private Auto Blood Pressure (mmHg): 138/85 Accompanied By: mother Schedule Follow-up Appointment: Yes Clinical Summary of Care: Electronic Signature(s) Signed: 10/11/2022 4:44:06 PM By: Massie Kluver Entered By: Massie Kluver on 10/05/2022 10:51:53 -------------------------------------------------------------------------------- Lower Extremity Assessment Details Patient Name: Date of Service: LAUNI, ASENCIO 10/05/2022 9:45 [name]: [id number] Patient Account Number: [id number] Date of Birth/Sex: Treating RN: [birth year] (46 y.o. Orvan Falconer Primary Care Haillee Johann:  Raelene Bott Other Clinician: Massie Kluver Referring Bluford Sedler: Treating Vallie Teters/Extender: Linna Hoff Weeks in Treatment: 29 Electronic Signature(s) Signed: 10/05/2022 3:37:15 PM By: Carlene Coria RN Signed: 10/11/2022 4:44:06 PM By: Massie Kluver Entered By: Massie Kluver on 10/05/2022 10:16:12 -------------------------------------------------------------------------------- Multi Wound Chart Details Patient Name: Date of Service: [name] 10/05/2022 9:45 [name]: [id number] Patient Account Number: [id number] Date of Birth/Sex: Treating RN: 10-25-1975 (46 y.o. Orvan Falconer Primary Care Quintan Saldivar: Raelene Bott Other Clinician: Massie Kluver Referring Nailah Luepke: Treating Edras Wilford/Extender: Eddie North in Treatment: 29 Vital Signs Height(in): 69 Pulse(bpm): 59 Weight(lbs): 178 Blood Pressure(mmHg): 138/85 Body Mass Index(BMI): 26.3 Temperature(F): 97.6 Respiratory Rate(breaths/min): 16 Galik, Lyliana [name] ([id number]) [id number] Page 4 of 10 [11:Photos:] [N/[name]:N/[name]] Right, Plantar Foot Left, Medial, Plantar Foot N/[name] Wound Location: Surgical Injury Pressure Injury N/[name] Wounding Event: Open Surgical Wound 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: Extremity Chronic sinus problems/congestion, Chronic sinus problems/congestion, N/[name] Comorbid History: Middle ear problems, Anemia, Chronic Middle ear problems, Anemia, Chronic Obstructive Pulmonary Disease Obstructive Pulmonary Disease (COPD), Congestive Heart Failure, (COPD), Congestive Heart Failure, Type II Diabetes, End Stage Renal Type II Diabetes, End Stage Renal Disease, History of pressure wounds, Disease, History of pressure wounds, Neuropathy Neuropathy 12/01/2021 03/16/2020 N/[name] Date Acquired: 29 29 N/[name] Weeks of Treatment: Open Open N/[name] Wound Status: No No N/[name] Wound Recurrence: 0.3x0.4x0.6 1.6x1x0.1  N/[name] Measurements L x W x D (cm) 0.094 1.257 N/[name] [name] (cm) : rea 0.057 0.126 N/[name] Volume (cm) : 94.90% 73.30% N/[name] % Reduction in Area: 96.60% 86.60% N/[name] % Reduction in Volume: Full Thickness Without Exposed Grade 3 N/[name] Classification: Support Structures Medium Medium N/[name] Exudate Amount: Serosanguineous Serous N/[name] Exudate Type: red, brown amber N/[name] Exudate Color: Distinct, outline attached Flat and Intact N/[name] Wound Margin: Medium (34-66%) Large (67-100%) N/[name] Granulation Amount: Pink, Pale Red, Pink N/[name] Granulation Quality: Medium (34-66%) None Present (0%) N/[name] Necrotic Amount: Fat Layer (Subcutaneous Tissue): Yes Fat Layer (Subcutaneous Tissue): Yes N/[name] Exposed Structures: Fascia: No Fascia: No Tendon: No Tendon: No Muscle: No Muscle: No Joint: No Joint: No Bone: No Bone: No Medium (34-66%) Small (1-33%) N/[name] Epithelialization: Treatment Notes Electronic Signature(s) Signed: 10/11/2022 4:44:06 PM By: Clifton James,  Angie Entered By: Massie Kluver on 10/05/2022 10:16:16 -------------------------------------------------------------------------------- Multi-Disciplinary Care Plan Details Patient Name: Date of Service: VALENE, VILLA 10/05/2022 9:45 [name]: [id number] Patient Account Number: [id number] Date of Birth/Sex: Treating RN: 24-Apr-1976 (46 y.o. Orvan Falconer Primary Care Jennell Janosik: Raelene Bott Other Clinician: Massie Kluver Referring Aleiah Mohammed: Treating Lynnell Fiumara/Extender: Eddie North in Treatment: [address] [place] ([id number]) [id number] Page 5 of 10 Active Inactive Abuse / Safety / Falls / Self Care Management Nursing Diagnoses: History of Falls Potential for falls Potential for injury related to falls Goals: Patient will not develop complications from immobility Date Initiated: 03/17/2022 Date Inactivated: 04/20/2022 Target Resolution Date: 03/16/2022 Goal Status:  Met Patient/caregiver will verbalize understanding of skin care regimen Date Initiated: 03/17/2022 Date Inactivated: 09/28/2022 Target Resolution Date: 03/16/2022 Goal Status: Met Patient/caregiver will verbalize/demonstrate measure taken to improve self care Date Initiated: 03/17/2022 Target Resolution Date: 03/16/2022 Goal Status: Active Interventions: Assess fall risk on admission and as needed Provide education on basic hygiene Provide education on personal and home safety Notes: Necrotic Tissue Nursing Diagnoses: Impaired tissue integrity related to necrotic/devitalized tissue Knowledge deficit related to management of necrotic/devitalized tissue Goals: Necrotic/devitalized tissue will be minimized in the wound bed Date Initiated: 03/17/2022 Target Resolution Date: 10/26/2022 Goal Status: Active Patient/caregiver will verbalize understanding of reason and process for debridement of necrotic tissue Date Initiated: 03/17/2022 Date Inactivated: 09/28/2022 Target Resolution Date: 03/16/2022 Goal Status: Met Interventions: Provide education on necrotic tissue and debridement process Treatment Activities: Apply topical anesthetic as ordered : 03/16/2022 Excisional debridement : 03/16/2022 Notes: Osteomyelitis Nursing Diagnoses: Infection: osteomyelitis Knowledge deficit related to disease process and management Goals: Patient/caregiver will verbalize understanding of disease process and disease management Date Initiated: 03/17/2022 Target Resolution Date: 03/16/2022 Goal Status: Active Patient's osteomyelitis will resolve Date Initiated: 03/17/2022 Target Resolution Date: 10/26/2022 Goal Status: Active Signs and symptoms for osteomyelitis will be recognized and promptly addressed Date Initiated: 03/17/2022 Target Resolution Date: 10/26/2022 Goal Status: Active Interventions: Assess for signs and symptoms of osteomyelitis resolution every visit Provide education on  osteomyelitis Treatment Activities: Systemic antibiotics : 03/16/2022 Notes: Patient on antibiotics. [name], [name] ([id number]) [id number] Page 6 of 10 Electronic Signature(s) Signed: 10/05/2022 3:37:15 PM By: Carlene Coria RN Signed: 10/11/2022 4:44:06 PM By: Massie Kluver Entered By: Massie Kluver on 10/05/2022 10:51:06 -------------------------------------------------------------------------------- Pain Assessment Details Patient Name: Date of Service: [name] 10/05/2022 9:45 [name]: [id number] Patient Account Number: [id number] Date of Birth/Sex: Treating RN: Feb 09, 1976 (46 y.o. Orvan Falconer Primary Care Asjah Rauda: Raelene Bott Other Clinician: Massie Kluver Referring Airiel Oblinger: Treating Lonza Shimabukuro/Extender: Eddie North in Treatment: 29 Active Problems Location of Pain Severity and Description of Pain Patient Has Paino Yes Site Locations Pain Location: Pain in Ulcers Duration of the Pain. Constant / Intermittento Intermittent Rate the pain. Current Pain Level: 5 Character of Pain Describe the Pain: Burning, Sharp Pain Management and Medication Current Pain Management: Medication: Yes Cold Application: No Rest: Yes Massage: No Activity: No T.E.N.S.: No Heat Application: No Leg drop or elevation: No Is the Current Pain Management Adequate: Inadequate How does your wound impact your activities of daily livingo Sleep: No Bathing: No Appetite: No Relationship With Others: No Bladder Continence: No Emotions: No Bowel Continence: No Work: No Toileting: No Drive: No Dressing: No Hobbies: No Electronic Signature(s) Signed: 10/05/2022 3:37:15 PM By: Carlene Coria RN Signed: 10/11/2022 4:44:06 PM By: Yvonna Alanis, Tanyia [name] ([id number]) [id number] Page 7 of 10  Signed: 10/11/2022 4:44:06 PM By: Massie Kluver Entered By: Massie Kluver on  10/05/2022 10:00:35 -------------------------------------------------------------------------------- Patient/Caregiver Education Details Patient Name: Date of Service: [name] 12/13/2023andnbsp9:45 [place] Record Number: [id number] Patient Account Number: [id number] Date of Birth/Gender: Treating RN: 1976-05-31 (46 y.o. Orvan Falconer Primary Care Physician: Raelene Bott Other Clinician: Massie Kluver Referring Physician: Treating Physician/Extender: Eddie North in Treatment: 29 Education Assessment Education Provided To: Patient Education Topics Provided Wound/Skin Impairment: Handouts: Other: continue wound care as directed Methods: Explain/Verbal Responses: State content correctly Electronic Signature(s) Signed: 10/11/2022 4:44:06 PM By: Massie Kluver Entered By: Massie Kluver on 10/05/2022 10:50:58 -------------------------------------------------------------------------------- Wound Assessment Details Patient Name: Date of Service: [name], [name] 10/05/2022 9:45 [name]: [id number] Patient Account Number: [id number] Date of Birth/Sex: Treating RN: 04/16/1976 (46 y.o. Orvan Falconer Primary Care Miquela Costabile: Raelene Bott Other Clinician: Massie Kluver Referring Brynnlie Unterreiner: Treating Josepha Barbier/Extender: Linna Hoff Weeks in Treatment: 29 Wound Status Wound Number: 11 Primary Open Surgical Wound Etiology: Wound Location: Right, Plantar Foot Wound Open Wounding Event: Surgical Injury Status: Date Acquired: 12/01/2021 Comorbid Chronic sinus problems/congestion, Middle ear problems, Anemia, Weeks Of Treatment: 29 History: Chronic Obstructive Pulmonary Disease (COPD), Congestive Heart Clustered Wound: No Failure, Type II Diabetes, End Stage Renal Disease, History of pressure wounds, Neuropathy [place], Chelise [name] ([id number]) [id number] Page 8 of 10 Photos Wound  Measurements Length: (cm) 0.3 Width: (cm) 0.4 Depth: (cm) 0.6 Area: (cm) 0.094 Volume: (cm) 0.057 % Reduction in Area: 94.9% % Reduction in Volume: 96.6% Epithelialization: Medium (34-66%) Tunneling: No Undermining: No Wound Description Classification: Full Thickness Without Exposed Suppo Wound Margin: Distinct, outline attached Exudate Amount: Medium Exudate Type: Serosanguineous Exudate Color: red, brown rt Structures Foul Odor After Cleansing: No Slough/Fibrino Yes Wound Bed Granulation Amount: Medium (34-66%) Exposed Structure Granulation Quality: Pink, Pale Fascia Exposed: No Necrotic Amount: Medium (34-66%) Fat Layer (Subcutaneous Tissue) Exposed: Yes Tendon Exposed: No Muscle Exposed: No Joint Exposed: No Bone Exposed: No Treatment Notes Wound #11 (Foot) Wound Laterality: Plantar, Right Cleanser Dakin 16 (oz) 0.25 Discharge Instruction: Use as directed. Soap and Water Discharge Instruction: Gently cleanse wound with antibacterial soap, rinse and pat dry prior to dressing wounds Peri-Wound Care Topical keystone gel Primary Dressing Aquacel Extra Hydrofiber Dressing, 2x2 (in/in) Secondary Dressing ABD Pad 5x9 (in/in) Discharge Instruction: Cover with ABD pad Secured With Medipore T - 72M Medipore H Soft Cloth Surgical T ape ape, 2x2 (in/yd) Kerlix Roll Sterile or Non-Sterile 6-ply 4.5x4 (yd/yd) Discharge Instruction: Apply Kerlix as directed Compression Wrap Compression Stockings Add-Ons Electronic Signature(s) Signed: 10/05/2022 3:37:15 PM By: Carlene Coria RN Signed: 10/11/2022 4:44:06 PM By: Massie Kluver Entered By: Massie Kluver on 10/05/2022 10:14:35 [name] ([id number]) [id number] Page 9 of 10 -------------------------------------------------------------------------------- Wound Assessment Details Patient Name: Date of Service: [name], [name] 10/05/2022 9:45 [name]: [id number] Patient  Account Number: [id number] Date of Birth/Sex: Treating RN: 05-05-1976 (46 y.o. Orvan Falconer Primary Care Farheen Pfahler: Raelene Bott Other Clinician: Massie Kluver Referring Timon Geissinger: Treating Jewett Mcgann/Extender: Linna Hoff Weeks in Treatment: 29 Wound Status Wound Number: 12 Primary Diabetic Wound/Ulcer of the Lower Extremity Etiology: Wound Location: Left, Medial, Plantar Foot Wound Open Wounding Event: Pressure Injury Status: Date Acquired: 03/16/2020 Comorbid Chronic sinus problems/congestion, Middle ear problems, Anemia, Weeks Of Treatment: 29 History: Chronic Obstructive Pulmonary Disease (COPD), Congestive Heart Clustered Wound: No Failure, Type II Diabetes, End Stage Renal Disease, History of pressure wounds, Neuropathy Photos Wound Measurements  Length: (cm) 1. Width: (cm) 1 Depth: (cm) 0. Area: (cm) 1 Volume: (cm) 0 6 % Reduction in Area: 73.3% % Reduction in Volume: 86.6% 1 Epithelialization: Small (1-33%) .257 Tunneling: No .126 Undermining: No Wound Description Classification: Grade 3 Wound Margin: Flat and Intact Exudate Amount: Medium Exudate Type: Serous Exudate Color: amber Foul Odor After Cleansing: No Slough/Fibrino No Wound Bed Granulation Amount: Large (67-100%) Exposed Structure Granulation Quality: Red, Pink Fascia Exposed: No Necrotic Amount: None Present (0%) Fat Layer (Subcutaneous Tissue) Exposed: Yes Tendon Exposed: No Muscle Exposed: No Joint Exposed: No Bone Exposed: No Treatment Notes Wound #12 (Foot) Wound Laterality: Plantar, Left, Medial Cleanser Ortez, Heidi [name] ([id number]) [id number] Page 10 of 10 Dakin 16 (oz) 0.25 Discharge Instruction: Use as directed. Peri-Wound Care Topical Vitamin AandD Ointment, 4(oz) tube Discharge Instruction: apply to red, broken down area Primary Dressing Hydrofera Blue Ready Transfer Foam, 2.5x2.5 (in/in) Discharge Instruction: Apply Hydrofera  Blue Ready to wound bed as directed Keystone gel Secondary Dressing ABD Pad 5x9 (in/in) Discharge Instruction: Cover with ABD pad Secured With [employer] Surgical T ape ape, 2x2 (in/yd) Kerlix Roll Sterile or Non-Sterile 6-ply 4.5x4 (yd/yd) Discharge Instruction: Apply Kerlix as directed Compression Wrap Compression Stockings Add-Ons Electronic Signature(s) Signed: 10/05/2022 3:37:15 PM By: Carlene Coria RN Signed: 10/11/2022 4:44:06 PM By: Massie Kluver Entered By: Massie Kluver on 10/05/2022 10:15:41 -------------------------------------------------------------------------------- Vitals Details Patient Name: Date of Service: [name], [name] 10/05/2022 9:45 [name]: [id number] Patient Account Number: [id number] Date of Birth/Sex: Treating RN: [birth year] (46 y.o. Orvan Falconer Primary Care Lennis Korb: Raelene Bott Other Clinician: Massie Kluver Referring Sherial Ebrahim: Treating Elias Bordner/Extender: Eddie North in Treatment: 29 Vital Signs Time Taken: 09:57 Temperature (F): 97.6 Height (in): 69 Pulse (bpm): 59 Weight (lbs): 178 Respiratory Rate (breaths/min): 16 Body Mass Index (BMI): 26.3 Blood Pressure (mmHg): 138/85 Reference Range: 80 - 120 mg / dl Electronic Signature(s) Signed: 10/11/2022 4:44:06 PM By: Massie Kluver Entered By: Massie Kluver on 10/05/2022 10:00:30

## 2022-10-12 ENCOUNTER — Encounter (HOSPITAL_BASED_OUTPATIENT_CLINIC_OR_DEPARTMENT_OTHER): Payer: Medicaid Other | Admitting: Internal Medicine

## 2022-10-12 DIAGNOSIS — L97512 Non-pressure chronic ulcer of other part of right foot with fat layer exposed: Secondary | ICD-10-CM

## 2022-10-12 DIAGNOSIS — E11621 Type 2 diabetes mellitus with foot ulcer: Secondary | ICD-10-CM

## 2022-10-12 DIAGNOSIS — L97528 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ity: Secondary | ICD-10-CM | POA: Diagnosis not present

## 2022-10-14 NOTE — Progress Notes (Signed)
BABY, GIEGER A ([id number]) [id number] Page 1 of 21 Visit Report for 10/12/2022 Chief Complaint Document Details Patient Name: Date of Service: ATTIE, NAWABI 10/12/2022 9:45 A M Medical Record Number: [id number] Patient Account Number: [id number] Date of Birth/Sex: Treating RN: 15-Feb-1976 (46 y.o. [name] Primary Care Provider: Raelene Bott Other Clinician: Massie Kluver Referring Provider: Treating Provider/Extender: Eddie North in Treatment: 30 Information Obtained from: Patient Chief Complaint 03/19/2021; patient referred by Dr. Luana Shu who has been looking after her left foot for quite a period of time for review of a nonhealing area in the left midfoot 03/12/2022; bilateral feet wounds and right lower extremity wound. Electronic Signature(s) Signed: 10/12/2022 11:07:53 AM By: Kalman Shan DO Entered By: Kalman Shan on 10/12/2022 10:33:32 -------------------------------------------------------------------------------- Debridement Details Patient Name: Date of Service: [name] 10/12/2022 9:45 A M Medical Record Number: [id number] Patient Account Number: [id number] Date of Birth/Sex: Treating RN: [birth year] (46 y.o. [name] Primary Care Provider: Raelene Bott Other Clinician: Massie Kluver Referring Provider: Treating Provider/Extender: Eddie North in Treatment: 30 Debridement Performed for Assessment: Wound #12 Left,Medial,Plantar Foot Performed By: Physician Kalman Shan, MD Debridement Type: Debridement Severity of Tissue Pre Debridement: Fat layer exposed Level of Consciousness (Pre-procedure): Awake and Alert Pre-procedure Verification/Time Out Yes - 10:18 Taken: Start Time: 10:18 T Area Debrided (L x W): otal 2 (cm) x 1.5 (cm) = 3 (cm) Tissue and other material debrided: Viable, Non-Viable, Callus, Slough, Subcutaneous, Slough Level:  Skin/Subcutaneous Tissue Debridement Description: Excisional Instrument: Curette Bleeding: Minimum Hemostasis Achieved: Pressure Response to Treatment: Procedure was tolerated well Level of Consciousness (Post- Awake and Alert procedure): ZANDRA, LAJEUNESSE A ([id number]) [id number] Page 2 of 21 Post Debridement Measurements of Total Wound Length: (cm) 1.4 Width: (cm) 0.8 Depth: (cm) 0.1 Volume: (cm) 0.088 Character of Wound/Ulcer Post Debridement: Stable Severity of Tissue Post Debridement: Fat layer exposed Post Procedure Diagnosis Same as Pre-procedure Electronic Signature(s) Signed: 10/12/2022 11:07:53 AM By: Kalman Shan DO Signed: 10/13/2022 3:46:10 PM By: Gretta Cool, BSN, RN, CWS, [name] RN, BSN Signed: 10/14/2022 9:48:27 AM By: Massie Kluver Entered By: Massie Kluver on 10/12/2022 10:22:08 -------------------------------------------------------------------------------- Debridement Details Patient Name: Date of Service: [name] 10/12/2022 9:45 A M Medical Record Number: [id number] Patient Account Number: [id number] Date of Birth/Sex: Treating RN: [birth year] (46 y.o. [name] Primary Care Provider: Raelene Bott Other Clinician: Massie Kluver Referring Provider: Treating Provider/Extender: Eddie North in Treatment: 30 Debridement Performed for Assessment: Wound #11 Right,Plantar Foot Performed By: Physician Kalman Shan, MD Debridement Type: Debridement Level of Consciousness (Pre-procedure): Awake and Alert Pre-procedure Verification/Time Out Yes - 10:22 Taken: Start Time: 10:22 T Area Debrided (L x W): otal 0.5 (cm) x 1 (cm) = 0.5 (cm) Tissue and other material debrided: Viable, Non-Viable, Callus, Slough, Subcutaneous, Slough Level: Skin/Subcutaneous Tissue Debridement Description: Excisional Instrument: Curette Bleeding: Minimum Hemostasis Achieved: Pressure Response to Treatment:  Procedure was tolerated well Level of Consciousness (Post- Awake and Alert procedure): Post Debridement Measurements of Total Wound Length: (cm) 0.2 Width: (cm) 0.4 Depth: (cm) 0.6 Volume: (cm) 0.038 Character of Wound/Ulcer Post Debridement: Stable Post Procedure Diagnosis Same as Pre-procedure Electronic Signature(s) Signed: 10/12/2022 11:07:53 AM By: Kalman Shan DO Signed: 10/13/2022 3:46:10 PM By: Gretta Cool, BSN, RN, CWS, [name] RN, BSN Signed: 10/14/2022 9:48:27 AM By: Massie Kluver Entered By: Massie Kluver on 10/12/2022 10:23:08 Nile Dear A ([id number]) [id number] Page 3 of 21 -------------------------------------------------------------------------------- HPI Details Patient Name: Date of Service: [name], [place]  10/12/2022 9:45 A M Medical Record Number: [id number] Patient Account Number: [id number] Date of Birth/Sex: Treating RN: 1976/01/23 (46 y.o. [name] Primary Care Provider: Raelene Bott Other Clinician: Massie Kluver Referring Provider: Treating Provider/Extender: Eddie North in Treatment: 30 History of Present Illness HPI Description: 01/18/18-She is here for initial evaluation of the left great toe ulcer. She is a poor historian in regards to timeframe in detail. She states approximately 4 weeks ago she lacerated her toe on something in the house. She followed up with her primary care who placed her on Bactrim and ultimately a second dose of Bactrim prior to coming to wound clinic. She states she has been treating the toe with peroxide, Betadine and a Band-Aid. She did not check her blood sugar this morning but checked it yesterday morning it was 327; she is unaware of a recent A1c and there are no current records. She saw Dr. she would've orthopedics last week for an old injury to the left ankle, she states he did not see her toe, nor did she bring it to his attention. She smokes approximately  1 pack cigarettes a day. Her social situation is concerning, she arrives this morning with her mother who appears extremely intoxicated/under the influence; her mother was asked to leave the room and be monitored by the patient's grandmother. The patient's aunt then accompanied the patient and the room throughout the rest of the appointment. We had a lengthy discussion regarding the deleterious effects of uncontrolled hyperglycemia and smoking as it relates to wound healing and overall health. She was strongly encouraged to decrease her smoking and get her diabetes under better control. She states she is currently on a diet and has cut down her [facility] consumption. The left toe is erythematous, macerated and slightly edematous with malodor present. The edema in her left foot is below her baseline, there is no erythema streaking. We will treat her with Santyl, doxycycline; we have ordered and xray, culture and provided a Peg assist surgical shoe and cultured the wound. 01/25/18-She is here in follow-up evaluation for a left great toe ulcer and presents with an abscess to her suprapubic area. She states her blood sugars remain elevated, feeling "sick" and if levels are below 250, but she is trying. She has made no attempt to decrease her smoking stating that we "can't take away her food in her cigarettes". She has been compliant with offloading using the PEG assist you. She is using Santyl daily. the culture obtained last week grew staph aureus and Enterococcus faecalis; continues on the doxycycline and Augmentin was added on Monday. The suprapubic area has erythema, no femoral variation, purple discoloration, minimal induration, was accessed with a cotton tip applicator with sanguinopurulent drainage, this was cultured, I suspect the current antibiotic treatment will cover and we will not add anything to her current treatment plan. She was advised to go to urgent care or ER with any change in redness,  induration or fever. 02/01/18-She is here in follow-up evaluation for left great toe ulcers and a new abdominal abscess from last week. She was able to use packing until earlier this week, where she "forgot it was there". She states she was feeling ill with GI symptoms last week and was not taking her antibiotic. She states her glucose levels have been predominantly less than 200, with occasional levels between 200-250. She thinks this was contributing to her GI symptoms as they have resolved without intervention. There continues to be significant  laceration to left toe, otherwise it clinically looks stable/improved. There is now less superficial opening to the lateral aspect of the great toe that was residual blister. We will transition to [facility] to all wounds, she will continue her Augmentin. If there is no change or deterioration next week for reculture. 02/08/18-She is here in follow-up evaluation for left great toe ulcer and abdominal ulcer. There is an improvement in both wounds. She has been wrapping her left toe with coban, not by our direction, which has created an area of discoloration to the medial aspect; she has been advised to NOT use coban secondary to her neuropathy. She states her glucose levels have been high over this last week ranging from 200-350, she continues to smoke. She admits to being less compliant with her offloading shoe. We will continue with same treatment plan and she will follow-up next week. 02/15/18-She is here in follow-up evaluation for left great toe ulcer and abdominal ulcer. The abdominal ulcer is epithelialized. The left great toe ulcer is improved and all injury from last week using the Coban wrap is resolved, the lateral ulcer is healed. She admits to noncompliance with wearing offloading shoe and admits to glucose levels being greater than 300 most of the week. She continues to smoke and expresses no desire to quit. There is one area medially  that probes deeper than it has historically, erythema to the toe and dorsal foot has consistently waxed and waned. There is no overt signs of cellulitis or infection but we will culture the wound for any occult infection given the new area of depth and erythema. We will hold off on sensitivities for initiation of antibiotic therapy. 02/22/18-She is here in follow up evaluation for left great toe ulcer. There is overall significant improvement in both wound appearance, erythema and edema with changes made last week. She was not initiated on antibiotic therapy. Culture obtained last week showed oxacillin sensitive staph aureus, sensitive to clindamycin. Clindamycin has been called into the pharmacy but she has been instructed to hold off on initiation secondary to overall clinical improvement and her history of antibiotic intolerance. She has been instructed to contact the clinic with any noted changes/deterioration and the wound, erythema, edema and/or pain. She will follow-up next week. She continues to smoke and her glucose levels remain elevated >250; she admits to compliance with offloading shoe 03/01/18 on evaluation today patient appears to be doing fairly well in regard to her left first toe ulcer. She has been tolerating the dressing changes with the [facility] Dressing without complication and overall this has definitely showed signs of improvement according to records as well is what the patient tells me today. I'm very pleased in that regard. She is having no pain today 03/08/18 She is here for follow up evaluation of a left great toe ulcer. She remains non-compliant with glucose control and smoking cessation; glucose levels consistently >200. She states that she got new shoe inserts/peg assist. She admits to compliance with offloading. Since my last evaluation there is significant improvement. We will switch to prisma at this time and she will follow up next week. She is noted to be  tachycardic at this appointment, heart rate 120s; she has a history of heart rate 70-130 according to our records. She admits to extreme agitation r/t personal issues; she was advised to monitor her heartrate and contact her physician if it does not return to a more normal range (<100). She takes cardizem twice daily. 03/15/18-She is here  in follow-up evaluation for left great toe ulcer. She remains noncompliant with glucose control and smoking cessation. She admits to compliance with wearing offloading shoe. The ulcer is improved/stable and we will continue with the same treatment plan and she will follow-up next week 03/22/18-She is here for evaluation for left great toe ulcer. There continues to be significant improvement despite recurrent hyperglycemia (over 500 yesterday) and she continues to smoke. She has been compliant with offloading and we will continue with same treatment plan and she will follow-up next week. 03/29/18-She is here for evaluation for left great toe ulcer. Despite continuing to smoke and uncontrolled diabetes she continues to improve. She is compliant with offloading shoe. We will continue with the same treatment plan and she will follow-up next week 04/05/18- She is here in follow up evaluation for a left great toe ulcer; she presents with small pustule to left fifth toe (resembles ant bite). She admits to compliance with wearing offloading shoe; continues to smoke or have uncontrolled blood glucose control. There is more callus than usual with evidence of bleeding; she denies known trauma. 04/12/18-She is here for evaluation of left great toe ulcer. Despite noncompliance with glycemic control and smoking she continues to make improvement. She continues to wear offloading shoe. The pustule, that was identified last week, to the left fifth toe is resolved. She will follow-up in 2 weeks 05/03/18-she is seen in follow-up evaluation for a left great toe ulcer. She is compliant with  offloading, otherwise noncompliant with glycemic control and Grape, Maicee A ([id number]) [id number] Page 4 of 21 smoking. She has plateaued and there is minimal improvement noted. We will transition to [facility], replaced the insert to her surgical shoe and she will follow-up in one week 05/10/18- She is here in follow up evaluation for a left great toe ulcer. It appears stable despite measurement change. We will continue with same treatment plan and follow up next week. 05/24/18-She is seen in follow-up evaluation for a left great toe ulcer. She remains compliant with offloading, has made significant improvement in her diet, decreasing the amount of sugar/soda. She said her recent A1c was 10.9 which is lower than. She did see a diabetic nutritionist/educator yesterday. She continues to smoke. We will continue with the same treatment plan and she'll follow-up next week. 05/31/18- She is seen in follow-up evaluation for left great toe ulcer. She continues to remain compliant with offloading, continues to make improvement in her diet, increasing her water and decreasing the amount of sugar/soda. She does continue to smoke with no desire to quit. We will apply Prisma to the depth and Hydrofera Blue over. We have not received insurance authorization for oasis. She will follow up next week. 06/07/18-She is seen in follow-up evaluation for left great toe ulcer. It has stalled according to today's measurements although base appears stable. She says she saw a diabetic educator yesterday; her average blood sugars are less than 300 which is an improvement for her. She continues to smoke and states "that's my next step" She continues with water over soda. We will order for xray, culture and reinstate ace wrap compression prior to placing apligraf for next week. She is voicing no complaints or concerns. Her dressing will change to iodoflex over the next week in preparation for  apligraf. 06/14/18-She is seen in follow-up evaluation for left great toe ulcer. Plain film x-ray performed last week was negative for osteomyelitis. Wound culture obtained last week grew strep B and OSSA; she is initiated  on keflex and cefdinir today; there is erythema to the toe which could be from ace wrap compression, she has a history of wrapping too tight and has has been encouraged to maintain ace wraps that we place today. We will hold off on application of apligraf today, will apply next week after antibiotic therapy has been initiated. She admits today that she has resumed taking a shower with her foot/toe submerged in water, she has been reminded to keep foot/toe out of the bath water. She will be seen in follow up next week 06/21/18-she is seen in follow-up evaluation for left great toe ulcer. She is tolerating antibiotic therapy with no GI disturbance. The wound is stable. Apligraf was applied today. She has been decreasing her smoking, only had 4 cigarettes yesterday and 1 today. She continues being more compliant in diabetic diet. She will follow-up next week for evaluation of site, if stable will remove at 2 weeks. 06/28/18- She is here in follow up evalution. Apligraf was placed last week, she states the dressing fell off on Tuesday and she was dressing with hydrofera blue. She is healed and will be discharged from the clinic today. She has been instructed to continue with smoking cessation, continue monitoring glucose levels, offloading for an additional 4 weeks and continue with hydrofera blue for additional two weeks for any possible microscopic opening. Readmission: 08/07/18 on evaluation today patient presents for reevaluation concerning the ulcer of her right great toe. She was previously discharged on 06/28/18 healed. Nonetheless she states that this began to show signs of drainage she subsequently went to her primary care provider. Subsequently an x-ray was performed on 08/01/18  which was negative. The patient was also placed on antibiotics at that time. Fortunately they should have been effective for the infection. Nonetheless she's been experiencing some improvement but still has a lot of drainage coming from the wound itself. 08/14/18 on evaluation today patient's wound actually does show signs of improvement in regard to the erythema at this point. She has completed the antibiotics. With that being said we did discuss the possibility of placing her in a total contact cast as of today although I think that I may want to give this just a little bit more time to ensure nothing recurrence as far as her infection is concerned. I do not want to put in the cast and risk infection at that time if things are not completely resolved. With that being said she is gonna require some debridement today. 08/21/18 on evaluation today patient actually appears to be doing okay in regard to her toe ulcer. She's been tolerating the dressing changes without complication. With that being said it does appear that she is ready and in fact I think it's appropriate for [place] to go ahead and initiate the total contact cast today. Nonetheless she will require some sharp debridement to prepare the wound for application. Overall I feel like things have been progressing well but we do need to do something to get this to close more readily. 08/24/18 patient seen today for reevaluation after having had the total contact cast applied on Tuesday. She seems to have done very well the wound appears to be doing great and overall I'm pleased with the progress that she's made. There were no abnormal areas of rubbing from the cast on her lower extremity. 08/30/18 on evaluation today patient actually appears to be completely healed in regard to her plantar toe ulcer. She tells me at this point she's been having  a lot of issues with the cast. She almost fell a couple of times the state shall the step of her dog a couple  times as well. This is been a very frustrating process for her other nonetheless she has completely healed the wound which is excellent news. Overall there does not appear to be the evidence of infection at this time which is great news. 09/11/18 evaluation today patient presents for follow-up concerning her great toe ulcer on the left which has unfortunately reopened since I last saw her which was only a couple of weeks ago. Unfortunately she was not able to get in to get the shoe and potentially the AFO that's gonna be necessary due to her left foot drop. She continues with offloading shoe but this is not enough to prevent her from reopening it appears. When we last had her in the total contact cast she did well from a healing standpoint but unfortunately the wound reopened as soon as she came out of the cast within just a couple of weeks. 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 This is also part of the reason why she falls on a regular basis. I just do not believe that is gonna be safe for [place] to reinitiate the total contact cast as last time we had this on she fell 3 times one week which is definitely not normal for her. 09/18/18 upon evaluation today the patient actually appears to be doing about the same in regard to her toe ulcer. She did not contact [facility] as I asked her to even though I had given her the prescription. In fact she actually states that she has no idea where the prescription is. She did apparently call [facility] and they told her that all she needed to do was bring the prescription in order to be able to be seen and work on getting the AFO for her left foot. With all that being said she still does not have an appointment and I'm not sure were things stand that regard. I will give her  a new prescription today in order to contact them to get this set up. 09/25/18 on evaluation today patient actually appears to be doing about the same in regard to her toes ulcer. She does have a small areas which seems to have a lot of callous buildup around the edge of the wound which is going to need sharp debridement today. She still is waiting to be scheduled for evaluation with [facility] for possibility of an AFO. She states there supposed to call her tomorrow to get this set up. Unfortunately it does appear that her foot specifically the toe area is showing signs of erythema. There does not appear to be any systemic infection which is in these good news. 10/02/18 on evaluation today patient actually appears to be doing about the same in regard to her toe ulcer. This really has not done too well although it's not significantly larger it's also not significantly smaller. She has been tolerating the dressing changes without complication. She actually has her appointment with [facility] and [facility] tomorrow to hopefully be measured for obtaining and AFO splint. I think this would be helpful preventing this from reoccurring. We had contemplated starting the cast this week although to be honest  I am reluctant to do that as she's been having nausea, vomiting, and seizure activity over the past three days. She has a history of seizures and have been told is nothing that can be done for these. With that being said I do believe that along with the seizures have the nausea vomiting which upon further questioning doesn't seem to be the normal for her and makes me concerned for the possibility of infection or something else going on. I discussed this with the patient and her mother during the office visit today. I do not feel the wound is effective but maybe something else. The responses this was "this just happens to her at times and we don't know why". They did not seem to be interested in going to the hospital  to have this checked out further. 10/09/18 on evaluation today patient presents for follow-up concerning her ongoing toe ulcer. She has been tolerating the dressing changes without complication. Fortunately there does not appear to be any evidence of infection which is great news however I do think that the patient would benefit from going ahead for with the total contact cast. She's actually in a wheelchair today she tells me that she will use her walker if we initiate the cast. I was very specific about the fact that if we were gonna do the cast I wanted to make sure that she was using the walker in order to prevent any falls. She tells me she does not have stairs that she has to traverse on a regular basis at her home. She has not had any seizures since last week again that something that happens to her often she tells me she did talk to [facility] [employer] and they said that it may take up to three weeks to get the brace approved for her. Hopefully that will not take that long but nonetheless in the meantime I do think the cast could be of benefit. 10/12/18 on evaluation today patient appears to be doing rather well in regard to her toe ulcer. It's just been a few days and already this is significantly improved both as far as overall appearance and size. Fortunately there's no sign of infection. She is here for her first obligatory cast change. ALAINE, LOUGHNEY A ([id number]) [id number] Page 5 of 21 10/19/18 Seen today for follow up and management of left great toe ulcer. Wound continues to show improvement. Noted small open area with seroussang drainage with palpation. Denies any increased pain or recent fevers during visit. She will continue calcium alginate with offloading shoe. Denies any questions or concerns during visit. 10/26/18 on evaluation today patient appears to be doing about the same as when I last saw her in regard to her wound bed. Fortunately there does not appear  to be any signs of infection. Unfortunately she continues to have a breakdown in regard to the toe region any time that she is not in the cast. It takes almost no time at all for this to happen. Nonetheless she still has not heard anything from the brace being made by [employer] as to when exactly this will be available to her. Fortunately there is no signs of infection at this time. 10/30/18 on evaluation today patient presents for application of the total contact cast as we just received him this morning. Fortunately we are gonna be able to apply this to her today which is great news. She continues to have no significant pain which is good news. Overall I do feel like things  have been improving while she was the cast is when she doesn't have a cast that things get worse. She still has not really heard anything from [facility] regarding her brace. 11/02/18 upon evaluation today patient's wound already appears to be doing significantly better which is good news. Fortunately there does not appear to be any signs of infection also good news. Overall I do think the total contact cast as before is helping to heal this area unfortunately it's just not gonna likely keep the area closed and healed without her getting her brace at least. Again the foot drop is a significant issue for her. 11/09/18 on evaluation today patient appears to be doing excellent in regard to her toe ulcer which in fact is completely healed. Fortunately we finally got the situation squared away with the paperwork which was needed to proceed with getting her brace approved by Medicaid. I have filled that out unfortunately that information has been sent to the orthopedic office that I worked at 2 1/2 years ago and not tired Current wound care measures. Fortunately she seems to be doing very well at this time. 11/23/18 on evaluation today patient appears to be doing More Poorly Compared to Last Time I Saw Her. At [facility] She Had Completely  Healed. Currently she is continuing to have issues with reopening. She states that she just found out that the brace was approved through Medicaid now she just has to go get measured in order to have this fitted for her and then made. Subsequently she does not have an appointment for this yet that is going to complicate things we obviously cannot put her back in the cast if we do not have everything measured because they're not gonna be able to measure her foot while she is in the cast. 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 Obviously this is unfortunate and does have me somewhat worried as well. 11/30/18 on evaluation today patient's toe ulcer actually appears to be doing fairly well. The good news is she will be getting her brace in the shoes next week on Wednesday. Hopefully we will be able to get this to heal without having to go back in the cast however she may need the cast in order to get the wound completely heal and then go from there. Fortunately there's no signs of infection at this time. 12/07/18 on evaluation today patient fortunately did receive her brace and she states she could tell this definitely makes her walk better. With that being said she's been having issues with her toe where she noticed yesterday there was a lot of tissue that was loosing off this appears to be much larger than what it was previous. She also states that her leg has been read putting much across the top of her foot just about the ankle although this seems to be receiving somewhat. The total area is still red and appears to be someone infected as best I can tell. She is previously taken Bactrim and that may be a good option for her today as well. We are gonna see what I wound culture shows as well and I think that this is definitely appropriate. With that being said outside of the culture I still need to initiate  something in the interim and that's what I'm gonna go ahead and select Bactrim is a good option for her. 12/14/18 on  evaluation today patient appears to be doing better in regard to her left great toe ulcer as compared to last week's evaluation. There's still some erythema although this is significantly improved which is excellent news. Overall I do believe that she is making good progress is still gonna take some time before she is where I would like her to be from the standpoint of being able to place her back into the total contact cast. Hopefully we will be where we need to be by next week. 12/21/18 on evaluation today patient actually appears to be doing poorly in regard to her toe ulcer. She's been tolerating the dressing changes without complication. Fortunately there's no signs of systemic infection although she does have a lot of drainage from the toe ulcer and this does seem to be causing some issues at this point. She does have erythema on the distal portion of her toe that appears to be likely cellulitis. 12/28/18 on evaluation today patient actually appears to be doing a little better in my pinion in regard to her toe ulcer. With that being said she still does have some evidence of infection at this time and for her culture she had both E. coli as well as enterococcus as organisms noted on evaluation. For that reason I think that though the Keflex likely has treated the E. coli rather well this has really done nothing for the enterococcus. We are going to have to initiate treatment for this specifically. 01/04/19 on evaluation today patient's toe actually appears to be doing better from the standpoint of infection. She currently would like to see about putting the cash back on I think that this is appropriate as long as she takes care of it and keeps it from getting wet. She is gonna have some drainage we can definitely pass this up with Drawtex and alginate to try to prevent as much drainage  as possible from causing the problems. With that being said I do want to at least try her with the cast between now and Tuesday. If there any issues we can't continue to use it then I will discontinue the use of the cast at that point. 01/08/19 on evaluation today patient actually appears to be doing very well as far as her foot ulcer specifically the great toe on the left is concerned. She did have an area of rubbing on the medial aspect of her left ankle which again is from the cast. Fortunately there's no signs of infection at this point in this appears to be a very slight skin breakdown. The patient tells me she felt it rubbing but didn't think it was that bad. Fortunately there is no signs of active infection at this time which is good news. No fevers, chills, nausea, or vomiting noted at this time. 01/15/19 on evaluation today patient actually appears to be doing well in regard to her toe ulcer. Again as previous she seems to do well and she has the cast on which indicates to me that during the time she doesn't have a cast on she's putting way too much pressure on this region. Obviously I think that's gonna be an issue as with the current national emergency concerning the Covid-19 Virus it has been recommended that we discontinue the use of total contact casting by the chief medical officer of our company, Dr. Simona Huh. The reasoning is that if a patient becomes sick and cannot come into have the cast removed they could not just leave this on for an  additional two weeks. Obviously the hospitals also do not want to receive patient's who are sick into the emergency department to potentially contaminate the region and spread the Covid-19 Virus among other sick individuals within the hospital system. Therefore at this point we are suspending the use of total contact cast until the current emergency subsides. This was all discussed with the patient today as well. 01/22/19 on evaluation today patient's wound  on her left great toe appears to be doing slightly worse than previously noted last week. She tells me that she has been on this quite a bit in fact she tells me she's been awake for 38 straight hours. This is due to the fact that she's having to care for grandparents because nobody else will. She has been taking care of them for five the last seven days since I've seen her they both have dementia his is from a stroke and her grandmother's was progressive. Nonetheless she states even her mom who knows her condition and situation has only help two of those days to take care of them she's been taking care of the rest. Fortunately there does not appear to be any signs of active infection in regard to her toe at this point although obviously it doesn't look as good as it did previous. I think this is directly related to her not taking off the pressure and friction by way of taking things easy. Though I completely understand what's going on. 01/29/19 on evaluation today patient's tools are actually appears to be showing some signs of improvement today compared to last week's evaluation as far as not necessarily the overall size of the wound but the fact that she has some new skin growth in between the two ends of the wound opening. Overall I feel like she has done well she states that she had a family member give her what sounds to be a CAM walker boot which has been helpful as well. 02/05/19 on evaluation today patient's wound bed actually appears to be doing significantly better in regard to her overall appearance of the size of the wound. With that being said she is still having an issue with offloading efficiently enough to get this to close. Apparently there is some signs of infection at this point as well unfortunately. Previously she's done well of Augmentin I really do not see anything that needs to be culture currently but there theme and cellulitis of the foot that I'm seeing I'm gonna go ahead and  place her on an antibiotic today to try to help clear this up. 02/12/2019 on evaluation today patient actually appears to be doing poorly in regard to her overall wound status. She tells me she has been using her offloading shoe but actually comes in today wearing her tennis shoe with the AFO brace. Again as I previously discussed with her this is really not sufficient to allow the area to heal appropriately. Nonetheless she continues to be somewhat noncompliant and I do wonder based on what she has told my nurse in the past as to HARLEA, GOETZINGER A ([id number]) [id number] Page 6 of 21 whether or not a good portion of this noncompliance may be recreational drug and alcohol related. She has had a history of heroin overdose and this was fairly recently in the past couple of months that have been seeing her. Nonetheless overall I feel like her wound looks significantly worse today compared to what it was previous. She still has significant erythema despite the Augmentin I  am not sure that this is an appropriate medication for her infection I am also concerned that the infection may have gone down into her bone. 02/19/19 on evaluation today patient actually appears to be doing about the same in regard to her toe ulcer. Unfortunately she continues to show signs of bone exposure and infection at this point. There does not appear to be any evidence of worsening of the infection but I'm also not really sure that it's getting significantly better. She is on the Augmentin which should be sufficient for the Staphylococcus aureus infection that she has at this point. With that being said she may need IV antibiotics to more appropriately treat this. We did have a discussion today about hyperbaric option therapy. 02/28/19 on evaluation today patient actually appears to be doing much worse in regard to the wound on her left great toe as compared to even my previous evaluation last week.  Unfortunately this seems to be training in a pretty poor direction. Her toe was actually now starting to angle laterally and I can actually see the entire joint area of the proximal portion of the digit where is the distal portion of the digit again is no longer even in contact with the joint line. Unfortunately there's a lot more necrotic tissue around the edge and the toe appears to be showing signs of becoming gangrenous in my pinion. I'm very concerned about were things stand at this point. She did see infectious disease and they are planning to send in a prescription for Sivextro for her and apparently this has been approved. With that being said I don't think she should avoid taking this but at the same time I'm not sure that it's gonna be sufficient to save her toe at this point. 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 03/05/19 on evaluation today patient actually appears to be doing quite well in regard to her toe all things considering. She still has bone exposed but there appears to be much less your thing on overall the appearance of the wound and the toe itself is dramatically improved. She still does have some issues currently obviously with infection she did see vascular as well and there concerned that her blood flow to the toad. For that reason they are setting up for an angiogram next week. 03/14/19 on evaluation today patient appears to be doing very poor in regard to her toe and specifically in regard to the ulceration and the fact that she's starting to notice the toe was leaning even more towards the lateral aspect and the complete joint is visible on the proximal aspect of the joint. Nonetheless she's also noted a significant odor and the tip of the toe is turning more dark and  necrotic appearing. Overall I think she is getting worse not better as far as this is concerned. For that reason I am recommending at this point that she likely needs to be seen for likely amputation. READMISSION 03/19/2021 This is a patient that we cared for in this clinic for a prolonged period of time in 2019 and 2020 with a left foot and left first toe wound. 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 Luana Shu. In December 2021 she had an ulcer on her right great toe as well as the fourth and fifth toes. 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 She also had an angiogram at that time and underwent angioplasty of the right anterior tibial artery. In any case she claims that the wound on the right foot is closed I did not look at this today which was probably an oversight although I think that should be done next week. After her surgery she developed a dehiscence but I do not see any follow-up of this. According to Dr. Deborra Medina last review that she was out of the area being cared for by another physician but recently came back to his attention. 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 A culture of this area showed E. coli apparently before she came back to see Dr. Luana Shu she was supposed to be receiving antibiotics but she did not really take them. Nor is she offloading this area at all. Finally her last hemoglobin A1c listed in epic was in March 2022 at 14.1 she says things are a lot better since then although I am not sure. 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 She had this wound at the time. During this admission she had E. coli sepsis a TEE was negative. Past medical history is extensive and includes type 2 diabetes with peripheral neuropathy  cardiomyopathy with an ejection fraction of 33%, hypertension, hyperlipidemia chronic renal failure stage III history of substance abuse with cocaine although she claims to be clean now verified by her mother. She is still a heavy cigarette smoker. She has a history of bipolar disorder seizure disorder ABI in our clinic was 1.05 6/1; left midfoot in the setting of a TMA done previously. Round circular wound with a "knuckle" of protruding tissue. The problem is that the knuckle was not attached to any of the surrounding granulation and this probed proximally widely I removed a large portion of this tissue. This wound goes with considerable undermining laterally. I do not feel any bone there was no purulence but this is a deep wound. 6/8; in spite of the debridement I did last week. She arrives with a wound looking exactly the same. A protruding "knuckle" of tissue nonadherent to most of the surrounding tissue. There is considerable depth around this from 6-12 o'clock at 2.7 cm and undermining of 1 cm. This does not look overtly infected and the x- ray I did last week was negative for any osseous abnormalities. We have been using silver collagen 6/15; deep tissue culture I did last week showed moderate staph aureus and moderate Pseudomonas. This will definitely require prolonged antibiotic therapy. The pathology on the protuberant area was negative for malignancy fungus etc. 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 We have been using silver collagen. I am going to be prescribing Levaquin for 2 weeks. Her CT scan of the foot is down for 7/5 6/22; CT scan of the foot on 7 5. She says she has hardware in the left leg from her previous fracture. She is on the Levaquin for the deep tissue culture I did that showed methicillin sensitive staph aureus and Pseudomonas. I gave her a 2-week supply and she will have another week. She arrives in clinic  today with the same protuberant tissue however this is  nonadherent to the tissue surrounding it. I am really at a loss to explain this unless there is underlying deep tissue infection 6/29; patient presents for 1 week follow-up. She has been using collagen to the wound bed. She reports taking her antibiotics as prescribed.She has no complaints or issues today. She denies signs of infection. 7/6; patient presents for one week followup. She has been using collagen to the wound bed. She states she is taking Levaquin however at times she is not able to keep it down. She denies signs of infection. 7/13; patient presents for 1 week follow-up. She has been using silver alginate to the wound bed. She still has nausea when taking her antibiotics. She denies signs of infection. 7/20; patient presents for 1 week follow-up. She has been using silver alginate with gentamicin cream to the wound bed. She denies any issues and has no complaints today. She denies signs of infection. 7/27; patient presents for 1 week follow-up. She continues to use silver alginate with gentamicin cream to the wound bed. She reports starting her antibiotics. She has no issues or complaints. Overall she reports stability to the wound. 8/3; patient presents for 1 week follow-up. She has been using silver alginate with gentamicin cream to the wound bed. She reports completing all antibiotics. She has no issues or complaints today. She denies signs of infection. 8/17; patient presents for 2-week follow-up. He is to use silver alginate to the wound bed. She has no issues or complaints today. She denies signs of infection. She reports her pain has improved in her foot since last clinic visit 8/24; patient presents for 1 week follow-up. She continues to use silver alginate to the wound bed. She has no issues or complaints. She denies signs of infection. Pain is stable. 9/7; patient presents for follow-up. She missed her last week  appointment due to feeling ill. She continues to use silver alginate. She has a new wound to the right lower extremity that is covered in eschar. She states It occurred over the past week and has no idea how it started. She currently denies signs of infection. 9/14; patient presents for follow-up. T the left foot wound she has been using gentamicin cream and silver alginate. T the right lower extremity wound she has o o been keeping this covered and has not obtain Santyl. 9/21; patient presents for follow-up. She reports using gentamicin cream and silver alginate to the left foot and Santyl to the right lower extremity wound. She has no issues or complaints today. She denies signs of infection. 9/28; patient presents for follow-up. She reports a new wound to her right heel. She states this occurred a few days ago and is progressively gotten worse. She has been trying to clean the area with a Q-tip and Santyl. She reports stability in the other 2 wounds. She has been using gentamicin cream and silver alginate to the left foot and Santyl to the right lower extremity wound. ABRYANNA, MUSOLINO A ([id number]) [id number] Page 7 of 21 10/12; patient presents for follow-up. She reports improvement to the wound beds. She is seeing vein and vascular to discuss the potential of a left BKA. She states they are going to do an arteriogram. She continues to use silver alginate with dressing changes to her wounds. 11/2; patient presents for follow-up. She states she has not been doing dressing changes to the wound beds. She states she is not able to offload the areas. She reports chronic pain to her left foot wound.  11/9; patient presents for follow-up. She came in with only socks on. She states she forgot to put on shoes. It is unclear if she is doing any dressing changes. She currently denies systemic signs of infection. 11/16; patient presents for follow-up. She came again only with  socks on. She states she does not wear shoes ever. It is unclear if she does dressing changes. She currently denies systemic signs of infection. 11/23; patient presents for follow-up. She wore her shoes today. It still unclear exactly what dressing she is using for each wound but she did states she obtained Dakin's solution and has been using this to the left foot wound. She currently denies signs of infection. 11/30; patient presents for follow-up. She has no issues or complaints today. She currently denies signs of infection. 12/7; patient presents for follow-up. She has no issues or complaints today. She has been using Hydrofera Blue to the right heel wound and Dakin solution to the left foot wound. Her right anterior leg wound is healed. She currently denies signs of infection. 12/14; patient presents for follow-up. She has been using Hydrofera Blue to the right heel and Dakin's to the left foot wounds. She has no issues or complaints today. She denies signs of infection. 12/21; patient presents for follow-up. She reports using Hydrofera Blue to the right heel and Dakin's to the left foot wound. She denies signs of infection. 12/28; patient presents for follow-up. She continues to use Dakin's to the left foot wound and Hydrofera Blue to the right heel wound. She denies signs of infection. 1/4; patient presents for follow-up. She has no issues or complaints today. She denies signs of infection. 1/11; patient presents for follow-up. It is unclear if she has been dressing these wounds over the past week. She currently denies signs of infection. 1/18; patient presents for follow-up. She states she has been using Dakin's wet-to-dry dressings to the left foot. She has been using Hydrofera Blue to the right foot foot wound. She states that the anterior right leg wound has reopened and draining serous fluid. She denies signs of infection. 1/25; patient presents for follow-up. She has no issues or  complaints today. 2/1; patient presents for follow-up. She has no issues or complaints today. She denies signs of infection. 2/8; patient presents for follow-up. She has lost her surgical shoes. She did not have a dressing to the right heel wound. She currently denies signs of infection. 2/15; patient presents for follow-up. She reports more pain to the right heel today. She denies purulent drainage Or fever/chills 2/22; patient presents for follow-up. She reports taking clindamycin over the past week. She states that she continues to have pain to her right heel. She reports purulent drainage. Readmission 03/16/2022 Ms. Tasmine Giesbrecht is a 46 year old female with a past medical history of type 2 diabetes, osteomyelitis to her feet, chronic systolic heart failure and bipolar disorder that presents to the clinic for bilateral feet wounds and right lower extremity wound. She was last seen in our clinic on 12/15/2021. At that time she had purulent drainage coming out of her right plantar foot and I recommended she go to the ED. She states she went to [facility] and has been there for the past 3 months. I cannot see the records. She states she had OR debridement and was on several weeks of IV antibiotics while inpatient. Since discharge she has not been taking care of the wound beds. She had nothing on her feet other than socks  today. She currently denies signs of infection. 5/31; patient presents for follow-up. She has been using Dakin's wet-to-dry dressings to the wound beds on her feet bilaterally and antibiotic ointment to the right anterior leg wound. She had a wound culture done at last clinic visit that showed moderate Pseudomonas aeruginosa sensitive to ciprofloxacin. She currently denies systemic signs of infection. 6/14; patient presents for follow-up. She received Keystone 5 days ago and has been using this on the wound beds. She states that last week she had to go to the hospital  because she had increased warmth and erythema to the right foot. She was started on 2 oral antibiotics. She states she has been taking these. She currently denies systemic signs of infection. She has no issues or complaints today. 6/21; patient presents for follow-up. She states she has been using Keystone antibiotics to the wound beds. She has no issues or complaints today. She denies signs of infection. 6/28; patient presents for follow-up. She has been using Keystone antibiotics to the wound beds. She has no issues or complaints today. 7/12; patient presents for follow-up. Has been using Keystone antibiotics to the wound beds with calcium alginate. She has no issues or complaints today. She never followed up with her orthopedic surgeon who did the OR debridement to the right foot. We discussed the total contact cast for the left foot and patient would like to do this next week. 7/19; patient presents for follow-up. She has been using Keystone antibiotics with calcium alginate to the wound beds. She has no issues or complaints today. Patient is in agreement to do the total contact cast of the left foot today. She knows to return later this week for the obligatory cast change. 05-13-2022 upon evaluation today patient's wound which she has the cast of the left leg actually appears to be doing significantly better. Fortunately I do not see any signs of active infection locally or systemically which is great news and overall I am extremely pleased with where we stand currently. 7/26; patient presents for follow-up. She has a cast in place for the past week. She states it irritated her shin. Other than that she tolerated the cast well. She states she would like a break for 1 week from the cast. We have been using Keystone antibiotic and Aquacel to both wound beds. She denies signs of infection. 8/2; patient presents for follow-up. She has been using Keystone and Aquacel to the wound beds. She denies any  issues and has no complaints. She is agreeable to have the cast placed today for the left leg. 06-03-2022 upon evaluation today patient appears to be doing well with regard to her wound she saw some signs of improvement which is great news. Fortunately I do not see any evidence of active infection locally or systemically at this time which is great news. No fevers, chills, nausea, vomiting, or diarrhea. 8/16; patient presents for follow-up. She has no issues or complaints today. We have been using Keystone and Aquacel to the wound beds. The left lower extremity is in a total contact cast. She is tolerated this well. 8/23; patient presents for follow-up. She has had the total contact cast on the left leg for the past week. Unfortunately this has rubbed and broken down the Waterhouse, Kristyl A ([id number]) [id number] Page 8 of 21 skin to the medial foot. She currently denies signs of infection. She has been using Keystone antibiotic to the right plantar foot wound. 8/30; patient presents for follow-up. We have  held off on the total contact cast for the left leg for the past week. Her wound on the left foot has improved and the previous surrounding breakdown of skin has epithelialized. She has been using Keystone antibiotic to both wound beds. She has no issues or complaints today. She denies signs of infection. 9/6; patient presents for follow-up. She has ordered her's Keystone antibiotic and this is arriving this week. She has been doing Dakin's wet-to-dry dressings to the wound beds. She denies signs of infection. She is agreeable to the total contact cast today. 9/13; patient presents for follow-up. She states that the cast caused her left leg shin to be sore. She would like to take a break from the cast this week. She has been using Keystone antibiotic to the right plantar foot wound. She denies signs of infection. 9/20; patient presents for follow-up. She has been using  Keystone antibiotics to the wound beds with calcium alginate to the right foot wound and Hydrofera Blue to the left foot wound. She is agreeable to having the cast placed today. She has been approved for Apligraf and we will order this for next clinic visit. 9/27; patient presents for follow-up. We have been using Keystone antibiotic with Hydrofera Blue to the left foot wound under a total contact cast. T the right o foot wound she has been using Keystone antibiotic and calcium alginate. She declines a total contact cast today. Apligraf is available for placement and she would like to proceed with this. 07-28-2022 upon evaluation today patient appears to be doing well currently in regard to her wound. She is actually showing signs of significant improvement which is great news. Fortunately I do not see any evidence of active infection locally nor systemically at this time. She has been seeing Dr. [place] and to be honest has been doing very well with the cast. Subsequently she comes in today with a cast on and we did reapply that today as well. She did not really want to she try to talk me out of that but I explained that if she wanted to heal this is really the right way to go. Patient voiced understanding. In regard to her right foot this is actually a lot better compared to the last time I saw her which is also great news. 10/11; patient presents for follow-up. Apligraf and the total contact cast was placed to the left leg at last clinic visit. She states that her right foot wound had burning pain to it with the placement of Apligraf to this area. She has been doing [place]terra over this area. She denies signs of infection including increased warmth, erythema or purulent drainage. 11/1; 3-week follow-up. The patient fortunately did not have a total contact cast or an Apligraf and on the left foot. She has been using Keystone ABD pads and kerlix and her own running shoes She arrives in clinic today  with thick callus and a very poor surface on the left foot on the right nonviable skin subcutaneous tissue and a deep probing hole. 11/15; patient missed her last clinic appointment. She states she has not been dressing the wound beds for the past 2 weeks. She states that at she had a new roommate but is now going back to live with her mother. Apparently its been a distracting 2 weeks. Patient currently denies signs of infection. 11/22; patient presents for follow-up. She states she has been using Keystone antibiotic and Dakin's wet-to-dry dressings to the wound beds. She is agreeable  for cast placement today. We had ordered Apligraf however this has not been received by our facility. 11/29; Patient had a total contact cast placed at last clinic visit and she tolerated this well. We were using silver alginate under the cast. Patient's been using Keystone antibiotic with Aquacel to the right plantar foot wound. She has no issues or complaints today. Apligraf is available for placement today. Patient would like to proceed with this. 12/6; patient presents for follow-up. She had Apligraf placed in standard fashion last clinic visit under the total contact cast to the left lower extremity. She has been using Keystone antibiotic and Aquacel to the right plantar foot wound. She has no issues or complaints today. 12/13; patient presents for follow-up. She has finished 5 Apligraf placements. Was told she would not qualify for more. We have been doing a total contact cast to the left lower extremity. She has been using Keystone antibiotic and Aquacel to the right plantar foot wound. She has no issues or complaints today. 12/20; patient presents for follow-up. We have been using Hydrofera Blue with Keystone antibiotic under a total contact cast of the left lower extremity. She reports using Keystone antibiotic and silver alginate to the right heel wound. She has no issues or complaints today. Electronic  Signature(s) Signed: 10/12/2022 11:07:53 AM By: Kalman Shan DO Entered By: Kalman Shan on 10/12/2022 10:34:30 -------------------------------------------------------------------------------- Physical Exam Details Patient Name: Date of Service: Michel Harrow 10/12/2022 9:45 A M Medical Record Number: [id number] Patient Account Number: [id number] Date of Birth/Sex: Treating RN: [birth year] (46 y.o. [name] Primary Care Provider: Raelene Bott Other Clinician: Massie Kluver Referring Provider: Treating Provider/Extender: Eddie North in Treatment: 6 Constitutional . Cardiovascular AUSTRALIA, DROLL A ([id number]) [id number] Page 9 of 21 . Psychiatric . Notes Right foot: T the plantar heel there is an incision site with increased depth.T the opening there is Nonviable tissue and granulation tissue. Left foot: T the o o o medial aspect there is an open wound with granulation tissue and nonviable tissue with surrounding callus. No overt signs of infection to any of the wound beds. Electronic Signature(s) Signed: 10/12/2022 11:07:53 AM By: Kalman Shan DO Entered By: Kalman Shan on 10/12/2022 10:35:53 -------------------------------------------------------------------------------- Physician Orders Details Patient Name: Date of Service: [name] 10/12/2022 9:45 A M Medical Record Number: [id number] Patient Account Number: [id number] Date of Birth/Sex: Treating RN: 04-Nov-1975 (46 y.o. [name] Primary Care Provider: Raelene Bott Other Clinician: Massie Kluver Referring Provider: Treating Provider/Extender: Eddie North in Treatment: 30 Verbal / Phone Orders: No Diagnosis Coding Follow-up Appointments Return Appointment in 1 week. Nurse Visit as needed Bathing/ Shower/ Hygiene May shower with wound dressing protected with water repellent cover or cast  protector. No tub bath. Anesthetic (Use 'Patient Medications' Section for Anesthetic Order Entry) Lidocaine applied to wound bed Edema Control - Lymphedema / Segmental Compressive Device / Other Elevate, Exercise Daily and A void Standing for Long Periods of Time. Elevate legs to the level of the heart and pump ankles as often as possible Elevate leg(s) parallel to the floor when sitting. Off-Loading Total Contact Cast to Left Lower Extremity Total Contact Cast to Right Lower Extremity - applied to left foot Open toe surgical shoe - Heel off loader - Right foot Other: - keep pressure off of feet. Medications-Please add to medication list. Wound #12 Left,Medial,Plantar Foot Keystone Compound - Keystone bilat feet Wound Treatment Wound #11 - Foot Wound Laterality: Plantar, Right  Cleanser: Dakin 16 (oz) 0.25 1 x Per Day/30 Days Discharge Instructions: Use as directed. Cleanser: Soap and Water 1 x Per Day/30 Days Discharge Instructions: Gently cleanse wound with antibacterial soap, rinse and pat dry prior to dressing wounds Topical: keystone gel 1 x Per Day/30 Days Prim Dressing: Aquacel Extra Hydrofiber Dressing, 2x2 (in/in) ary 1 x Per Day/30 Days FELMA, PFEFFERLE A ([id number]) [id number] Page 10 of 21 Secondary Dressing: ABD Pad 5x9 (in/in) (Generic) 1 x Per Day/30 Days Discharge Instructions: Cover with ABD pad Secured With: Medipore T - 58M Medipore H Soft Cloth Surgical T ape ape, 2x2 (in/yd) (Generic) 1 x Per Day/30 Days Secured With: Kerlix Roll Sterile or Non-Sterile 6-ply 4.5x4 (yd/yd) (Generic) 1 x Per Day/30 Days Discharge Instructions: Apply Kerlix as directed Wound #12 - Foot Wound Laterality: Plantar, Left, Medial Cleanser: Dakin 16 (oz) 0.25 (Dispense As Written) 1 x Per Day/30 Days Discharge Instructions: Use as directed. Topical: Vitamin AandD Ointment, 4(oz) tube 1 x Per Day/30 Days Discharge Instructions: apply to red, broken down  area Prim Dressing: Hydrofera Blue Ready Transfer Foam, 2.5x2.5 (in/in) 1 x Per Day/30 Days ary Discharge Instructions: Apply Hydrofera Blue Ready to wound bed as directed Prim Dressing: Keystone gel ary 1 x Per Day/30 Days Secondary Dressing: ABD Pad 5x9 (in/in) (Generic) 1 x Per Day/30 Days Discharge Instructions: Cover with ABD pad Secured With: Medipore T - 58M Medipore H Soft Cloth Surgical T ape ape, 2x2 (in/yd) (Generic) 1 x Per Day/30 Days Secured With: Kerlix Roll Sterile or Non-Sterile 6-ply 4.5x4 (yd/yd) (Generic) 1 x Per Day/30 Days Discharge Instructions: Apply Kerlix as directed Electronic Signature(s) Signed: 10/12/2022 11:07:53 AM By: Kalman Shan DO Entered By: Kalman Shan on 10/12/2022 10:41:15 -------------------------------------------------------------------------------- Problem List Details Patient Name: Date of Service: [name] 10/12/2022 9:45 A M Medical Record Number: [id number] Patient Account Number: [id number] Date of Birth/Sex: Treating RN: 1976-05-01 (46 y.o. [name] Primary Care Provider: Raelene Bott Other Clinician: Massie Kluver Referring Provider: Treating Provider/Extender: Eddie North in Treatment: 30 Active Problems ICD-10 Encounter Code Description 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 03/16/2022 No Yes E11.621 Type 2 diabetes mellitus with foot ulcer 03/16/2022 No Yes Balz, Laquisha A ([id number]) [id number] Page 11 of 21 E11.42 Type 2 diabetes mellitus with diabetic polyneuropathy 03/16/2022 No Yes L97.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 03/16/2022 No Yes of skin Inactive Problems Resolved Problems Electronic Signature(s) Signed:  10/12/2022 11:07:53 AM By: Kalman Shan DO Entered By: Kalman Shan on 10/12/2022 10:30:46 -------------------------------------------------------------------------------- Progress Note Details Patient Name: Date of Service: [name] 10/12/2022 9:45 A M Medical Record Number: [id number] Patient Account Number: [id number] Date of Birth/Sex: Treating RN: December 11, 1975 (46 y.o. [name] Primary Care Provider: Raelene Bott Other Clinician: Massie Kluver Referring Provider: Treating Provider/Extender: Eddie North in Treatment: 30 Subjective Chief Complaint Information obtained from Patient 03/19/2021; patient referred by Dr. Luana Shu who has been looking after her left foot for quite a period of time for review of a nonhealing area in the left midfoot 03/12/2022; bilateral feet wounds and right lower extremity wound. History of Present Illness (HPI) 01/18/18-She is here for initial evaluation of the left great toe ulcer. She is a poor historian in regards to timeframe in detail. She  states approximately 4 weeks ago she lacerated her toe on something in the house. She followed up with her primary care who placed her on Bactrim and ultimately a second dose of Bactrim prior to coming to wound clinic. She states she has been treating the toe with peroxide, Betadine and a Band-Aid. She did not check her blood sugar this morning but checked it yesterday morning it was 327; she is unaware of a recent A1c and there are no current records. She saw Dr. she would've orthopedics last week for an old injury to the left ankle, she states he did not see her toe, nor did she bring it to his attention. She smokes approximately 1 pack cigarettes a day. Her social situation is concerning, she arrives this morning with her mother who appears extremely intoxicated/under the influence; her mother was asked to leave the room and be monitored by the patient's grandmother. The  patient's aunt then accompanied the patient and the room throughout the rest of the appointment. We had a lengthy discussion regarding the deleterious effects of uncontrolled hyperglycemia and smoking as it relates to wound healing and overall health. She was strongly encouraged to decrease her smoking and get her diabetes under better control. She states she is currently on a diet and has cut down her [facility] consumption. The left toe is erythematous, macerated and slightly edematous with malodor present. The edema in her left foot is below her baseline, there is no erythema streaking. We will treat her with Santyl, doxycycline; we have ordered and xray, culture and provided a Peg assist surgical shoe and cultured the wound. 01/25/18-She is here in follow-up evaluation for a left great toe ulcer and presents with an abscess to her suprapubic area. She states her blood sugars remain elevated, feeling "sick" and if levels are below 250, but she is trying. She has made no attempt to decrease her smoking stating that we "can't take away her food in her cigarettes". She has been compliant with offloading using the PEG assist you. She is using Santyl daily. the culture obtained last week grew staph aureus and Enterococcus faecalis; continues on the doxycycline and Augmentin was added on Monday. The suprapubic area has erythema, no femoral variation, purple discoloration, minimal induration, was accessed with a cotton tip applicator with sanguinopurulent drainage, this was cultured, I suspect the current antibiotic treatment will cover and we will not add anything to her current treatment plan. She was advised to go to urgent care or ER with any change in redness, induration or fever. 02/01/18-She is here in follow-up evaluation for left great toe ulcers and a new abdominal abscess from last week. She was able to use packing until earlier this week, where she "forgot it was there". She states she was  feeling ill with GI symptoms last week and was not taking her antibiotic. She states her glucose levels have been predominantly less than 200, with occasional levels between 200-250. She thinks this was contributing to her GI symptoms as they have resolved without intervention. There continues to be significant laceration to left toe, otherwise it clinically looks stable/improved. There is now less superficial opening to the lateral aspect of the great toe that was residual blister. We will transition to [facility] to all wounds, she will continue her Augmentin. If there is no change or deterioration next week for reculture. 02/08/18-She is here in follow-up evaluation for left great toe ulcer and abdominal ulcer. There is an improvement in both wounds. She  has been wrapping her left toe with coban, not by our direction, which has created an area of discoloration to the medial aspect; she has been advised to NOT use coban secondary to her neuropathy. She states her glucose levels have been high over this last week ranging from 200-350, she continues to smoke. She admits to being less compliant with her offloading shoe. We will continue with same treatment plan and she will follow-up next week. 02/15/18-She is here in follow-up evaluation for left great toe ulcer and abdominal ulcer. The abdominal ulcer is epithelialized. The left great toe ulcer is improved Errickson, Christain A ([id number]) [id number] Page 12 of 21 and all injury from last week using the Coban wrap is resolved, the lateral ulcer is healed. She admits to noncompliance with wearing offloading shoe and admits to glucose levels being greater than 300 most of the week. She continues to smoke and expresses no desire to quit. There is one area medially that probes deeper than it has historically, erythema to the toe and dorsal foot has consistently waxed and waned. There is no overt signs of cellulitis or  infection but we will culture the wound for any occult infection given the new area of depth and erythema. We will hold off on sensitivities for initiation of antibiotic therapy. 02/22/18-She is here in follow up evaluation for left great toe ulcer. There is overall significant improvement in both wound appearance, erythema and edema with changes made last week. She was not initiated on antibiotic therapy. Culture obtained last week showed oxacillin sensitive staph aureus, sensitive to clindamycin. Clindamycin has been called into the pharmacy but she has been instructed to hold off on initiation secondary to overall clinical improvement and her history of antibiotic intolerance. She has been instructed to contact the clinic with any noted changes/deterioration and the wound, erythema, edema and/or pain. She will follow-up next week. She continues to smoke and her glucose levels remain elevated >250; she admits to compliance with offloading shoe 03/01/18 on evaluation today patient appears to be doing fairly well in regard to her left first toe ulcer. She has been tolerating the dressing changes with the [facility] Dressing without complication and overall this has definitely showed signs of improvement according to records as well is what the patient tells me today. I'm very pleased in that regard. She is having no pain today 03/08/18 She is here for follow up evaluation of a left great toe ulcer. She remains non-compliant with glucose control and smoking cessation; glucose levels consistently >200. She states that she got new shoe inserts/peg assist. She admits to compliance with offloading. Since my last evaluation there is significant improvement. We will switch to prisma at this time and she will follow up next week. She is noted to be tachycardic at this appointment, heart rate 120s; she has a history of heart rate 70-130 according to our records. She admits to extreme agitation r/t personal  issues; she was advised to monitor her heartrate and contact her physician if it does not return to a more normal range (<100). She takes cardizem twice daily. 03/15/18-She is here in follow-up evaluation for left great toe ulcer. She remains noncompliant with glucose control and smoking cessation. She admits to compliance with wearing offloading shoe. The ulcer is improved/stable and we will continue with the same treatment plan and she will follow-up next week 03/22/18-She is here for evaluation for left great toe ulcer. There continues to be significant improvement despite recurrent hyperglycemia (over 500  yesterday) and she continues to smoke. She has been compliant with offloading and we will continue with same treatment plan and she will follow-up next week. 03/29/18-She is here for evaluation for left great toe ulcer. Despite continuing to smoke and uncontrolled diabetes she continues to improve. She is compliant with offloading shoe. We will continue with the same treatment plan and she will follow-up next week 04/05/18- She is here in follow up evaluation for a left great toe ulcer; she presents with small pustule to left fifth toe (resembles ant bite). She admits to compliance with wearing offloading shoe; continues to smoke or have uncontrolled blood glucose control. There is more callus than usual with evidence of bleeding; she denies known trauma. 04/12/18-She is here for evaluation of left great toe ulcer. Despite noncompliance with glycemic control and smoking she continues to make improvement. She continues to wear offloading shoe. The pustule, that was identified last week, to the left fifth toe is resolved. She will follow-up in 2 weeks 05/03/18-she is seen in follow-up evaluation for a left great toe ulcer. She is compliant with offloading, otherwise noncompliant with glycemic control and smoking. She has plateaued and there is minimal improvement noted. We will transition to [facility], replaced the insert to her surgical shoe and she will follow-up in one week 05/10/18- She is here in follow up evaluation for a left great toe ulcer. It appears stable despite measurement change. We will continue with same treatment plan and follow up next week. 05/24/18-She is seen in follow-up evaluation for a left great toe ulcer. She remains compliant with offloading, has made significant improvement in her diet, decreasing the amount of sugar/soda. She said her recent A1c was 10.9 which is lower than. She did see a diabetic nutritionist/educator yesterday. She continues to smoke. We will continue with the same treatment plan and she'll follow-up next week. 05/31/18- She is seen in follow-up evaluation for left great toe ulcer. She continues to remain compliant with offloading, continues to make improvement in her diet, increasing her water and decreasing the amount of sugar/soda. She does continue to smoke with no desire to quit. We will apply Prisma to the depth and Hydrofera Blue over. We have not received insurance authorization for oasis. She will follow up next week. 06/07/18-She is seen in follow-up evaluation for left great toe ulcer. It has stalled according to today's measurements although base appears stable. She says she saw a diabetic educator yesterday; her average blood sugars are less than 300 which is an improvement for her. She continues to smoke and states "that's my next step" She continues with water over soda. We will order for xray, culture and reinstate ace wrap compression prior to placing apligraf for next week. She is voicing no complaints or concerns. Her dressing will change to iodoflex over the next week in preparation for apligraf. 06/14/18-She is seen in follow-up evaluation for left great toe ulcer. Plain film x-ray performed last week was negative for osteomyelitis. Wound culture obtained last week grew strep B and OSSA; she is initiated on keflex and cefdinir  today; there is erythema to the toe which could be from ace wrap compression, she has a history of wrapping too tight and has has been encouraged to maintain ace wraps that we place today. We will hold off on application of apligraf today, will apply next week after antibiotic therapy has been initiated. She admits today that she has resumed taking a shower with her foot/toe submerged in water,  she has been reminded to keep foot/toe out of the bath water. She will be seen in follow up next week 06/21/18-she is seen in follow-up evaluation for left great toe ulcer. She is tolerating antibiotic therapy with no GI disturbance. The wound is stable. Apligraf was applied today. She has been decreasing her smoking, only had 4 cigarettes yesterday and 1 today. She continues being more compliant in diabetic diet. She will follow-up next week for evaluation of site, if stable will remove at 2 weeks. 06/28/18- She is here in follow up evalution. Apligraf was placed last week, she states the dressing fell off on Tuesday and she was dressing with hydrofera blue. She is healed and will be discharged from the clinic today. She has been instructed to continue with smoking cessation, continue monitoring glucose levels, offloading for an additional 4 weeks and continue with hydrofera blue for additional two weeks for any possible microscopic opening. Readmission: 08/07/18 on evaluation today patient presents for reevaluation concerning the ulcer of her right great toe. She was previously discharged on 06/28/18 healed. Nonetheless she states that this began to show signs of drainage she subsequently went to her primary care provider. Subsequently an x-ray was performed on 08/01/18 which was negative. The patient was also placed on antibiotics at that time. Fortunately they should have been effective for the infection. Nonetheless she's been experiencing some improvement but still has a lot of drainage coming from the wound  itself. 08/14/18 on evaluation today patient's wound actually does show signs of improvement in regard to the erythema at this point. She has completed the antibiotics. With that being said we did discuss the possibility of placing her in a total contact cast as of today although I think that I may want to give this just a little bit more time to ensure nothing recurrence as far as her infection is concerned. I do not want to put in the cast and risk infection at that time if things are not completely resolved. With that being said she is gonna require some debridement today. 08/21/18 on evaluation today patient actually appears to be doing okay in regard to her toe ulcer. She's been tolerating the dressing changes without complication. With that being said it does appear that she is ready and in fact I think it's appropriate for [place] to go ahead and initiate the total contact cast today. Nonetheless she will require some sharp debridement to prepare the wound for application. Overall I feel like things have been progressing well but we do need to do something to get this to close more readily. 08/24/18 patient seen today for reevaluation after having had the total contact cast applied on Tuesday. She seems to have done very well the wound appears to be doing great and overall I'm pleased with the progress that she's made. There were no abnormal areas of rubbing from the cast on her lower extremity. 08/30/18 on evaluation today patient actually appears to be completely healed in regard to her plantar toe ulcer. She tells me at this point she's been having a lot of issues with the cast. She almost fell a couple of times the state shall the step of her dog a couple times as well. This is been a very frustrating process for her other nonetheless she has completely healed the wound which is excellent news. Overall there does not appear to be the evidence of infection at this time which is great news. 09/11/18  evaluation today patient presents for follow-up  concerning her great toe ulcer on the left which has unfortunately reopened since I last saw her which was only a couple of weeks ago. Unfortunately she was not able to get in to get the shoe and potentially the AFO that's gonna be necessary due to her left foot drop. She continues with offloading shoe but this is not enough to prevent her from reopening it appears. When we last had her in the total contact cast she did well from a healing standpoint but unfortunately the wound reopened as soon as she came out of the cast within just a couple of weeks. 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 This is also part of the reason why she falls on a regular basis. I just do not believe that is gonna be safe for [place] to reinitiate the total contact cast as last time we had this on she fell 3 times one week which is definitely not normal for her. 09/18/18 upon evaluation today the patient actually appears to be doing about the same in regard to her toe ulcer. She did not contact [employer] as I asked her to AIJA, SCARFO ([id number]) [id number] Page 13 of 21 even though I had given her the prescription. In fact she actually states that she has no idea where the prescription is. She did apparently call [employer] and they told her that all she needed to do was bring the prescription in order to be able to be seen and work on getting the AFO for her left foot. With all that being said she still does not have an appointment and I'm not sure were things stand that regard. I will give her a new prescription today in order to contact them to get this set up. 09/25/18 on evaluation today patient actually appears to be doing about the same in regard to  her toes ulcer. She does have a small areas which seems to have a lot of callous buildup around the edge of the wound which is going to need sharp debridement today. She still is waiting to be scheduled for evaluation with [employer] for possibility of an AFO. She states there supposed to call her tomorrow to get this set up. Unfortunately it does appear that her foot specifically the toe area is showing signs of erythema. There does not appear to be any systemic infection which is in these good news. 10/02/18 on evaluation today patient actually appears to be doing about the same in regard to her toe ulcer. This really has not done too well although it's not significantly larger it's also not significantly smaller. She has been tolerating the dressing changes without complication. She actually has her appointment with [employer] and [facility] tomorrow to hopefully be measured for obtaining and AFO splint. I think this would be helpful preventing this from reoccurring. We had contemplated starting the cast this week although to be honest I am reluctant to do that as she's been having nausea, vomiting, and seizure activity over the past three days. She has a history of seizures and have been told is nothing that can be done for these. With that being said I do believe that along with the seizures have the nausea vomiting which upon further questioning doesn't seem to be the normal for  her and makes me concerned for the possibility of infection or something else going on. I discussed this with the patient and her mother during the office visit today. I do not feel the wound is effective but maybe something else. The responses this was "this just happens to her at times and we don't know why". They did not seem to be interested in going to the hospital to have this checked out further. 10/09/18 on evaluation today patient presents for follow-up concerning her ongoing toe ulcer. She has been tolerating the  dressing changes without complication. Fortunately there does not appear to be any evidence of infection which is great news however I do think that the patient would benefit from going ahead for with the total contact cast. She's actually in a wheelchair today she tells me that she will use her walker if we initiate the cast. I was very specific about the fact that if we were gonna do the cast I wanted to make sure that she was using the walker in order to prevent any falls. She tells me she does not have stairs that she has to traverse on a regular basis at her home. She has not had any seizures since last week again that something that happens to her often she tells me she did talk to [employer] and they said that it may take up to three weeks to get the brace approved for her. Hopefully that will not take that long but nonetheless in the meantime I do think the cast could be of benefit. 10/12/18 on evaluation today patient appears to be doing rather well in regard to her toe ulcer. It's just been a few days and already this is significantly improved both as far as overall appearance and size. Fortunately there's no sign of infection. She is here for her first obligatory cast change. 10/19/18 Seen today for follow up and management of left great toe ulcer. Wound continues to show improvement. Noted small open area with seroussang drainage with palpation. Denies any increased pain or recent fevers during visit. She will continue calcium alginate with offloading shoe. Denies any questions or concerns during visit. 10/26/18 on evaluation today patient appears to be doing about the same as when I last saw her in regard to her wound bed. Fortunately there does not appear to be any signs of infection. Unfortunately she continues to have a breakdown in regard to the toe region any time that she is not in the cast. It takes almost no time at all for this to happen. Nonetheless she still has not heard anything  from the brace being made by [employer] as to when exactly this will be available to her. Fortunately there is no signs of infection at this time. 10/30/18 on evaluation today patient presents for application of the total contact cast as we just received him this morning. Fortunately we are gonna be able to apply this to her today which is great news. She continues to have no significant pain which is good news. Overall I do feel like things have been improving while she was the cast is when she doesn't have a cast that things get worse. She still has not really heard anything from [place] regarding her brace. 11/02/18 upon evaluation today patient's wound already appears to be doing significantly better which is good news. Fortunately there does not appear to be any signs of infection also good news. Overall I do think the total contact cast as before is helping  to heal this area unfortunately it's just not gonna likely keep the area closed and healed without her getting her brace at least. Again the foot drop is a significant issue for her. 11/09/18 on evaluation today patient appears to be doing excellent in regard to her toe ulcer which in fact is completely healed. Fortunately we finally got the situation squared away with the paperwork which was needed to proceed with getting her brace approved by Medicaid. I have filled that out unfortunately that information has been sent to the orthopedic office that I worked at 2 1/2 years ago and not tired Current wound care measures. Fortunately she seems to be doing very well at this time. 11/23/18 on evaluation today patient appears to be doing More Poorly Compared to Last Time I Saw Her. At [facility] She Had Completely Healed. Currently she is continuing to have issues with reopening. She states that she just found out that the brace was approved through Medicaid now she just has to go get measured in order to have this fitted for her and then made.  Subsequently she does not have an appointment for this yet that is going to complicate things we obviously cannot put her back in the cast if we do not have everything measured because they're not gonna be able to measure her foot while she is in the cast. 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 Obviously this is unfortunate and does have me somewhat worried as well. 11/30/18 on evaluation today patient's toe ulcer actually appears to be doing fairly well. The good news is she will be getting her brace in the shoes next week on Wednesday. Hopefully we will be able to get this to heal without having to go back in the cast however she may need the cast in order to get the wound completely heal and then go from there. Fortunately there's no signs of infection at this time. 12/07/18 on evaluation today patient fortunately did receive her brace and she states she could tell this definitely makes her walk better. With that being said she's been having issues with her toe where she noticed yesterday there was a lot of tissue that was loosing off this appears to be much larger than what it was previous. She also states that her leg has been read putting much across the top of her foot just about the ankle although this seems to be receiving somewhat. The total area is still red and appears to be someone infected as best I can tell. She is previously taken Bactrim and that may be a good option for her today as well. We are gonna see what I wound culture shows as well and I think that this is definitely appropriate. With that being said outside of the culture I still need to initiate something in the interim and that's what I'm gonna go ahead and select Bactrim is a good option for her. 12/14/18 on evaluation today patient appears to be doing better in regard to her left great toe ulcer as compared to last week's  evaluation. There's still some erythema although this is significantly improved which is excellent news. Overall I do believe that she is making good progress is still gonna take some time before she is where I would like her to be from the standpoint of being able to place her back into the total  contact cast. Hopefully we will be where we need to be by next week. 12/21/18 on evaluation today patient actually appears to be doing poorly in regard to her toe ulcer. She's been tolerating the dressing changes without complication. Fortunately there's no signs of systemic infection although she does have a lot of drainage from the toe ulcer and this does seem to be causing some issues at this point. She does have erythema on the distal portion of her toe that appears to be likely cellulitis. 12/28/18 on evaluation today patient actually appears to be doing a little better in my pinion in regard to her toe ulcer. With that being said she still does have some evidence of infection at this time and for her culture she had both E. coli as well as enterococcus as organisms noted on evaluation. For that reason I think that though the Keflex likely has treated the E. coli rather well this has really done nothing for the enterococcus. We are going to have to initiate treatment for this specifically. 01/04/19 on evaluation today patient's toe actually appears to be doing better from the standpoint of infection. She currently would like to see about putting the cash back on I think that this is appropriate as long as she takes care of it and keeps it from getting wet. She is gonna have some drainage we can definitely pass this up with Drawtex and alginate to try to prevent as much drainage as possible from causing the problems. With that being said I do want to at least try her with the cast between now and Tuesday. If there any issues we can't continue to use it then I will discontinue the use of the cast at that  point. 01/08/19 on evaluation today patient actually appears to be doing very well as far as her foot ulcer specifically the great toe on the left is concerned. She did have an area of rubbing on the medial aspect of her left ankle which again is from the cast. Fortunately there's no signs of infection at this point in this DARLYS, BUIS A ([id number]) [id number] Page 14 of 21 appears to be a very slight skin breakdown. The patient tells me she felt it rubbing but didn't think it was that bad. Fortunately there is no signs of active infection at this time which is good news. No fevers, chills, nausea, or vomiting noted at this time. 01/15/19 on evaluation today patient actually appears to be doing well in regard to her toe ulcer. Again as previous she seems to do well and she has the cast on which indicates to me that during the time she doesn't have a cast on she's putting way too much pressure on this region. Obviously I think that's gonna be an issue as with the current national emergency concerning the Covid-19 Virus it has been recommended that we discontinue the use of total contact casting by the chief medical officer of our company, Dr. Simona Huh. The reasoning is that if a patient becomes sick and cannot come into have the cast removed they could not just leave this on for an additional two weeks. Obviously the hospitals also do not want to receive patient's who are sick into the emergency department to potentially contaminate the region and spread the Covid-19 Virus among other sick individuals within the hospital system. Therefore at this point we are suspending the use of total contact cast until the current emergency subsides. This was all discussed with the patient today as  well. 01/22/19 on evaluation today patient's wound on her left great toe appears to be doing slightly worse than previously noted last week. She tells me that she has been on this quite a bit in  fact she tells me she's been awake for 38 straight hours. This is due to the fact that she's having to care for grandparents because nobody else will. She has been taking care of them for five the last seven days since I've seen her they both have dementia his is from a stroke and her grandmother's was progressive. Nonetheless she states even her mom who knows her condition and situation has only help two of those days to take care of them she's been taking care of the rest. Fortunately there does not appear to be any signs of active infection in regard to her toe at this point although obviously it doesn't look as good as it did previous. I think this is directly related to her not taking off the pressure and friction by way of taking things easy. Though I completely understand what's going on. 01/29/19 on evaluation today patient's tools are actually appears to be showing some signs of improvement today compared to last week's evaluation as far as not necessarily the overall size of the wound but the fact that she has some new skin growth in between the two ends of the wound opening. Overall I feel like she has done well she states that she had a family member give her what sounds to be a CAM walker boot which has been helpful as well. 02/05/19 on evaluation today patient's wound bed actually appears to be doing significantly better in regard to her overall appearance of the size of the wound. With that being said she is still having an issue with offloading efficiently enough to get this to close. Apparently there is some signs of infection at this point as well unfortunately. Previously she's done well of Augmentin I really do not see anything that needs to be culture currently but there theme and cellulitis of the foot that I'm seeing I'm gonna go ahead and place her on an antibiotic today to try to help clear this up. 02/12/2019 on evaluation today patient actually appears to be doing poorly in regard  to her overall wound status. She tells me she has been using her offloading shoe but actually comes in today wearing her tennis shoe with the AFO brace. Again as I previously discussed with her this is really not sufficient to allow the area to heal appropriately. Nonetheless she continues to be somewhat noncompliant and I do wonder based on what she has told my nurse in the past as to whether or not a good portion of this noncompliance may be recreational drug and alcohol related. She has had a history of heroin overdose and this was fairly recently in the past couple of months that have been seeing her. Nonetheless overall I feel like her wound looks significantly worse today compared to what it was previous. She still has significant erythema despite the Augmentin I am not sure that this is an appropriate medication for her infection I am also concerned that the infection may have gone down into her bone. 02/19/19 on evaluation today patient actually appears to be doing about the same in regard to her toe ulcer. Unfortunately she continues to show signs of bone exposure and infection at this point. There does not appear to be any evidence of worsening of the infection but I'm  also not really sure that it's getting significantly better. She is on the Augmentin which should be sufficient for the Staphylococcus aureus infection that she has at this point. With that being said she may need IV antibiotics to more appropriately treat this. We did have a discussion today about hyperbaric option therapy. 02/28/19 on evaluation today patient actually appears to be doing much worse in regard to the wound on her left great toe as compared to even my previous evaluation last week. Unfortunately this seems to be training in a pretty poor direction. Her toe was actually now starting to angle laterally and I can actually see the entire joint area of the proximal portion of the digit where is the distal portion of the  digit again is no longer even in contact with the joint line. Unfortunately there's a lot more necrotic tissue around the edge and the toe appears to be showing signs of becoming gangrenous in my pinion. I'm very concerned about were things stand at this point. She did see infectious disease and they are planning to send in a prescription for Sivextro for her and apparently this has been approved. With that being said I don't think she should avoid taking this but at the same time I'm not sure that it's gonna be sufficient to save her toe at this point. 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 03/05/19 on evaluation today patient actually appears to be doing quite well in regard to her toe all things considering. She still has bone exposed but there appears to be much less your thing on overall the appearance of the wound and the toe itself is dramatically improved. She still does have some issues currently obviously with infection she did see vascular as well and there concerned that her blood flow to the toad. For that reason they are setting up for an angiogram next week. 03/14/19 on evaluation today patient appears to be doing very poor in regard to her toe and specifically in regard to the ulceration and the fact that she's starting to notice the toe was leaning even more towards the lateral aspect and the complete joint is visible on the proximal aspect of the joint. Nonetheless she's also noted a significant odor and the tip of the toe is turning more dark and necrotic appearing. Overall I think she is getting worse not better as far as this is concerned. For that reason I am recommending at this point that she likely needs to be seen for likely amputation. READMISSION 03/19/2021 This is a  patient that we cared for in this clinic for a prolonged period of time in 2019 and 2020 with a left foot and left first toe wound. 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 Luana Shu. In December 2021 she had an ulcer on her right great toe as well as the fourth and fifth toes. 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 She also had an angiogram at that time and underwent angioplasty of the right anterior tibial artery. In any case she claims that the wound on the right foot is closed I did not look  at this today which was probably an oversight although I think that should be done next week. After her surgery she developed a dehiscence but I do not see any follow-up of this. According to Dr. Deborra Medina last review that she was out of the area being cared for by another physician but recently came back to his attention. 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 A culture of this area showed E. coli apparently before she came back to see Dr. Luana Shu she was supposed to be receiving antibiotics but she did not really take them. Nor is she offloading this area at all. Finally her last hemoglobin A1c listed in epic was in March 2022 at 14.1 she says things are a lot better since then although I am not sure. 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 She had this wound at the time. During this admission she had E. coli sepsis a TEE was negative. 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 She is still a heavy  cigarette smoker. She has a history of bipolar disorder seizure disorder ABI in our clinic was 1.05 6/1; left midfoot in the setting of a TMA done previously. Round circular wound with a "knuckle" of protruding tissue. The problem is that the knuckle was not attached to any of the surrounding granulation and this probed proximally widely I removed a large portion of this tissue. This wound goes with considerable undermining laterally. I do not feel any bone there was no purulence but this is a deep wound. 6/8; in spite of the debridement I did last week. She arrives with a wound looking exactly the same. A protruding "knuckle" of tissue nonadherent to most of the surrounding tissue. There is considerable depth around this from 6-12 o'clock at 2.7 cm and undermining of 1 cm. This does not look overtly infected and the x- ray I did last week was negative for any osseous abnormalities. We have been using silver collagen 6/15; deep tissue culture I did last week showed moderate staph aureus and moderate Pseudomonas. This will definitely require prolonged antibiotic therapy. The pathology on the protuberant area was negative for malignancy fungus etc. 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 We have been using silver collagen. I am going to be prescribing Levaquin for 2 weeks. Her CT scan of the foot is down for 7/5 6/22; CT scan of the foot on 7 5. She says she has hardware in the left leg from her previous fracture. She is on the Levaquin for the deep tissue culture I did Baldonado, Suheily A ([id number]) [id number] Page 15 of 21 that showed methicillin sensitive staph aureus and Pseudomonas. I gave her a 2-week supply and she will have another week. She arrives in clinic today with the same protuberant tissue however this is nonadherent to the tissue surrounding it. I am really at a loss to explain this unless there is  underlying deep tissue infection 6/29; patient presents for 1 week follow-up. She has been using collagen to the wound bed. She reports taking her antibiotics as prescribed.She has no complaints or issues today. She denies signs of infection. 7/6; patient presents for one week followup. She has  been using collagen to the wound bed. She states she is taking Levaquin however at times she is not able to keep it down. She denies signs of infection. 7/13; patient presents for 1 week follow-up. She has been using silver alginate to the wound bed. She still has nausea when taking her antibiotics. She denies signs of infection. 7/20; patient presents for 1 week follow-up. She has been using silver alginate with gentamicin cream to the wound bed. She denies any issues and has no complaints today. She denies signs of infection. 7/27; patient presents for 1 week follow-up. She continues to use silver alginate with gentamicin cream to the wound bed. She reports starting her antibiotics. She has no issues or complaints. Overall she reports stability to the wound. 8/3; patient presents for 1 week follow-up. She has been using silver alginate with gentamicin cream to the wound bed. She reports completing all antibiotics. She has no issues or complaints today. She denies signs of infection. 8/17; patient presents for 2-week follow-up. He is to use silver alginate to the wound bed. She has no issues or complaints today. She denies signs of infection. She reports her pain has improved in her foot since last clinic visit 8/24; patient presents for 1 week follow-up. She continues to use silver alginate to the wound bed. She has no issues or complaints. She denies signs of infection. Pain is stable. 9/7; patient presents for follow-up. She missed her last week appointment due to feeling ill. She continues to use silver alginate. She has a new wound to the right lower extremity that is covered in eschar. She states It  occurred over the past week and has no idea how it started. She currently denies signs of infection. 9/14; patient presents for follow-up. T the left foot wound she has been using gentamicin cream and silver alginate. T the right lower extremity wound she has o o been keeping this covered and has not obtain Santyl. 9/21; patient presents for follow-up. She reports using gentamicin cream and silver alginate to the left foot and Santyl to the right lower extremity wound. She has no issues or complaints today. She denies signs of infection. 9/28; patient presents for follow-up. She reports a new wound to her right heel. She states this occurred a few days ago and is progressively gotten worse. She has been trying to clean the area with a Q-tip and Santyl. She reports stability in the other 2 wounds. She has been using gentamicin cream and silver alginate to the left foot and Santyl to the right lower extremity wound. 10/12; patient presents for follow-up. She reports improvement to the wound beds. She is seeing vein and vascular to discuss the potential of a left BKA. She states they are going to do an arteriogram. She continues to use silver alginate with dressing changes to her wounds. 11/2; patient presents for follow-up. She states she has not been doing dressing changes to the wound beds. She states she is not able to offload the areas. She reports chronic pain to her left foot wound. 11/9; patient presents for follow-up. She came in with only socks on. She states she forgot to put on shoes. It is unclear if she is doing any dressing changes. She currently denies systemic signs of infection. 11/16; patient presents for follow-up. She came again only with socks on. She states she does not wear shoes ever. It is unclear if she does dressing changes. She currently denies systemic signs of infection. 11/23; patient  presents for follow-up. She wore her shoes today. It still unclear exactly what  dressing she is using for each wound but she did states she obtained Dakin's solution and has been using this to the left foot wound. She currently denies signs of infection. 11/30; patient presents for follow-up. She has no issues or complaints today. She currently denies signs of infection. 12/7; patient presents for follow-up. She has no issues or complaints today. She has been using Hydrofera Blue to the right heel wound and Dakin solution to the left foot wound. Her right anterior leg wound is healed. She currently denies signs of infection. 12/14; patient presents for follow-up. She has been using Hydrofera Blue to the right heel and Dakin's to the left foot wounds. She has no issues or complaints today. She denies signs of infection. 12/21; patient presents for follow-up. She reports using Hydrofera Blue to the right heel and Dakin's to the left foot wound. She denies signs of infection. 12/28; patient presents for follow-up. She continues to use Dakin's to the left foot wound and Hydrofera Blue to the right heel wound. She denies signs of infection. 1/4; patient presents for follow-up. She has no issues or complaints today. She denies signs of infection. 1/11; patient presents for follow-up. It is unclear if she has been dressing these wounds over the past week. She currently denies signs of infection. 1/18; patient presents for follow-up. She states she has been using Dakin's wet-to-dry dressings to the left foot. She has been using Hydrofera Blue to the right foot foot wound. She states that the anterior right leg wound has reopened and draining serous fluid. She denies signs of infection. 1/25; patient presents for follow-up. She has no issues or complaints today. 2/1; patient presents for follow-up. She has no issues or complaints today. She denies signs of infection. 2/8; patient presents for follow-up. She has lost her surgical shoes. She did not have a dressing to the right heel wound.  She currently denies signs of infection. 2/15; patient presents for follow-up. She reports more pain to the right heel today. She denies purulent drainage Or fever/chills 2/22; patient presents for follow-up. She reports taking clindamycin over the past week. She states that she continues to have pain to her right heel. She reports purulent drainage. Readmission 03/16/2022 Ms. Pat Elicker is a 46 year old female with a past medical history of type 2 diabetes, osteomyelitis to her feet, chronic systolic heart failure and bipolar disorder that presents to the clinic for bilateral feet wounds and right lower extremity wound. She was last seen in our clinic on 12/15/2021. At that time she had purulent drainage coming out of her right plantar foot and I recommended she go to the ED. She states she went to [facility] and has been there for the past 3 months. I cannot see the records. She states she had OR debridement and was on several weeks of IV antibiotics while inpatient. Since discharge she has not been taking care of the wound beds. She had nothing on her feet other than socks today. She currently denies signs of infection. 5/31; patient presents for follow-up. She has been using Dakin's wet-to-dry dressings to the wound beds on her feet bilaterally and antibiotic ointment to the right anterior leg wound. She had a wound culture done at last clinic visit that showed moderate Pseudomonas aeruginosa sensitive to ciprofloxacin. She currently denies systemic signs of infection. 6/14; patient presents for follow-up. She received Keystone 5 days ago and has  been using this on the wound beds. She states that last week she had to go to the hospital because she had increased warmth and erythema to the right foot. She was started on 2 oral antibiotics. She states she has been taking these. She currently denies systemic signs of infection. She has no issues or complaints today. LIYAT, FAULKENBERRY A  ([id number]) [id number] Page 16 of 21 6/21; patient presents for follow-up. She states she has been using Keystone antibiotics to the wound beds. She has no issues or complaints today. She denies signs of infection. 6/28; patient presents for follow-up. She has been using Keystone antibiotics to the wound beds. She has no issues or complaints today. 7/12; patient presents for follow-up. Has been using Keystone antibiotics to the wound beds with calcium alginate. She has no issues or complaints today. She never followed up with her orthopedic surgeon who did the OR debridement to the right foot. We discussed the total contact cast for the left foot and patient would like to do this next week. 7/19; patient presents for follow-up. She has been using Keystone antibiotics with calcium alginate to the wound beds. She has no issues or complaints today. Patient is in agreement to do the total contact cast of the left foot today. She knows to return later this week for the obligatory cast change. 05-13-2022 upon evaluation today patient's wound which she has the cast of the left leg actually appears to be doing significantly better. Fortunately I do not see any signs of active infection locally or systemically which is great news and overall I am extremely pleased with where we stand currently. 7/26; patient presents for follow-up. She has a cast in place for the past week. She states it irritated her shin. Other than that she tolerated the cast well. She states she would like a break for 1 week from the cast. We have been using Keystone antibiotic and Aquacel to both wound beds. She denies signs of infection. 8/2; patient presents for follow-up. She has been using Keystone and Aquacel to the wound beds. She denies any issues and has no complaints. She is agreeable to have the cast placed today for the left leg. 06-03-2022 upon evaluation today patient appears to be doing well with  regard to her wound she saw some signs of improvement which is great news. Fortunately I do not see any evidence of active infection locally or systemically at this time which is great news. No fevers, chills, nausea, vomiting, or diarrhea. 8/16; patient presents for follow-up. She has no issues or complaints today. We have been using Keystone and Aquacel to the wound beds. The left lower extremity is in a total contact cast. She is tolerated this well. 8/23; patient presents for follow-up. She has had the total contact cast on the left leg for the past week. Unfortunately this has rubbed and broken down the skin to the medial foot. She currently denies signs of infection. She has been using Keystone antibiotic to the right plantar foot wound. 8/30; patient presents for follow-up. We have held off on the total contact cast for the left leg for the past week. Her wound on the left foot has improved and the previous surrounding breakdown of skin has epithelialized. She has been using Keystone antibiotic to both wound beds. She has no issues or complaints today. She denies signs of infection. 9/6; patient presents for follow-up. She has ordered her's Keystone antibiotic and this is arriving this week. She has been  doing Dakin's wet-to-dry dressings to the wound beds. She denies signs of infection. She is agreeable to the total contact cast today. 9/13; patient presents for follow-up. She states that the cast caused her left leg shin to be sore. She would like to take a break from the cast this week. She has been using Keystone antibiotic to the right plantar foot wound. She denies signs of infection. 9/20; patient presents for follow-up. She has been using Keystone antibiotics to the wound beds with calcium alginate to the right foot wound and Hydrofera Blue to the left foot wound. She is agreeable to having the cast placed today. She has been approved for Apligraf and we will order this for next clinic  visit. 9/27; patient presents for follow-up. We have been using Keystone antibiotic with Hydrofera Blue to the left foot wound under a total contact cast. T the right o foot wound she has been using Keystone antibiotic and calcium alginate. She declines a total contact cast today. Apligraf is available for placement and she would like to proceed with this. 07-28-2022 upon evaluation today patient appears to be doing well currently in regard to her wound. She is actually showing signs of significant improvement which is great news. Fortunately I do not see any evidence of active infection locally nor systemically at this time. She has been seeing Dr. Heber [place] and to be honest has been doing very well with the cast. Subsequently she comes in today with a cast on and we did reapply that today as well. She did not really want to she try to talk me out of that but I explained that if she wanted to heal this is really the right way to go. Patient voiced understanding. In regard to her right foot this is actually a lot better compared to the last time I saw her which is also great news. 10/11; patient presents for follow-up. Apligraf and the total contact cast was placed to the left leg at last clinic visit. She states that her right foot wound had burning pain to it with the placement of Apligraf to this area. She has been doing [place] over this area. She denies signs of infection including increased warmth, erythema or purulent drainage. 11/1; 3-week follow-up. The patient fortunately did not have a total contact cast or an Apligraf and on the left foot. She has been using Keystone ABD pads and kerlix and her own running shoes She arrives in clinic today with thick callus and a very poor surface on the left foot on the right nonviable skin subcutaneous tissue and a deep probing hole. 11/15; patient missed her last clinic appointment. She states she has not been dressing the wound beds for the past 2  weeks. She states that at she had a new roommate but is now going back to live with her mother. Apparently its been a distracting 2 weeks. Patient currently denies signs of infection. 11/22; patient presents for follow-up. She states she has been using Keystone antibiotic and Dakin's wet-to-dry dressings to the wound beds. She is agreeable for cast placement today. We had ordered Apligraf however this has not been received by our facility. 11/29; Patient had a total contact cast placed at last clinic visit and she tolerated this well. We were using silver alginate under the cast. Patient's been using Keystone antibiotic with Aquacel to the right plantar foot wound. She has no issues or complaints today. Apligraf is available for placement today. Patient would like to proceed with  this. 12/6; patient presents for follow-up. She had Apligraf placed in standard fashion last clinic visit under the total contact cast to the left lower extremity. She has been using Keystone antibiotic and Aquacel to the right plantar foot wound. She has no issues or complaints today. 12/13; patient presents for follow-up. She has finished 5 Apligraf placements. Was told she would not qualify for more. We have been doing a total contact cast to the left lower extremity. She has been using Keystone antibiotic and Aquacel to the right plantar foot wound. She has no issues or complaints today. 12/20; patient presents for follow-up. We have been using Hydrofera Blue with Keystone antibiotic under a total contact cast of the left lower extremity. She reports using Keystone antibiotic and silver alginate to the right heel wound. She has no issues or complaints today. DAMILOLA, FLAMM A ([id number]) [id number] Page 17 of 21 Objective Constitutional Vitals Time Taken: 9:53 AM, Height: 69 in, Weight: 178 lbs, BMI: 26.3, Temperature: 98.7 F, Pulse: 71 bpm, Respiratory Rate: 16 breaths/min, Blood  Pressure: 144/82 mmHg. General Notes: Right foot: T the plantar heel there is an incision site with increased depth.T the opening there is Nonviable tissue and granulation tissue. Left o o foot: T the medial aspect there is an open wound with granulation tissue and nonviable tissue with surrounding callus. No overt signs of infection to any of the o wound beds. Integumentary (Hair, Skin) Wound #11 status is Open. Original cause of wound was Surgical Injury. The date acquired was: 12/01/2021. The wound has been in treatment 30 weeks. The wound is located on the [place]. The wound measures 0.2cm length x 0.4cm width x 0.6cm depth; 0.063cm^2 area and 0.038cm^3 volume. There is Fat Layer (Subcutaneous Tissue) exposed. There is no tunneling or undermining noted. There is a medium amount of serosanguineous drainage noted. The wound margin is distinct with the outline attached to the wound base. There is medium (34-66%) pink, pale granulation within the wound bed. There is a medium (34-66%) amount of necrotic tissue within the wound bed. Wound #12 status is Open. Original cause of wound was Pressure Injury. The date acquired was: 03/16/2020. The wound has been in treatment 30 weeks. The wound is located on the [place]. The wound measures 1.4cm length x 0.8cm width x 0.1cm depth; 0.88cm^2 area and 0.088cm^3 volume. There is Fat Layer (Subcutaneous Tissue) exposed. There is no tunneling or undermining noted. There is a medium amount of serous drainage noted. The wound margin is flat and intact. There is large (67-100%) red, pink granulation within the wound bed. There is no necrotic tissue within the wound bed. Assessment Active Problems ICD-10 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ity Non-pressure chronic ulcer of other part of right foot with fat layer exposed Type 2 diabetes mellitus with foot ulcer Type 2 diabetes mellitus with  diabetic polyneuropathy Non-pressure chronic ulcer of other part of right lower leg limited to breakdown of skin Patient's right heel wound is stable. I debrided nonviable tissue here and recommended continuing with silver alginate and Keystone antibiotic ointment. Continue aggressive offloading with peg assist in surgical shoe. The left plantar foot wound has improved in size and appearance. I debrided nonviable tissue and recommended continue with Keystone antibiotic and Hydrofera Blue under the total contact cast. Follow-up in 1 week. Procedures Wound #11 Pre-procedure diagnosis of Wound #11 is an Open Surgical Wound located on the Right,Plantar Foot . There was a Excisional Skin/Subcutaneous  Tissue Debridement with a total area of 0.5 sq cm performed by Kalman Shan, MD. With the following instrument(s): Curette to remove Viable and Non-Viable tissue/material. Material removed includes Callus, Subcutaneous Tissue, and Slough. A time out was conducted at 10:22, prior to the start of the procedure. A Minimum amount of bleeding was controlled with Pressure. The procedure was tolerated well. Post Debridement Measurements: 0.2cm length x 0.4cm width x 0.6cm depth; 0.038cm^3 volume. Character of Wound/Ulcer Post Debridement is stable. 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 There was a Excisional Skin/Subcutaneous Tissue Debridement with a total area of 3 sq cm performed by Kalman Shan, MD. With the following instrument(s): Curette to remove Viable and Non-Viable tissue/material. Material removed includes Callus, Subcutaneous Tissue, and Slough. A time out was conducted at 10:18, prior to the start of the procedure. A Minimum amount of bleeding was controlled with Pressure.  The procedure was tolerated well. Post Debridement Measurements: 1.4cm length x 0.8cm width x 0.1cm depth; 0.088cm^3 volume. Character of Wound/Ulcer Post Debridement is stable. Severity of Tissue Post Debridement is: Fat layer exposed. 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 There was a T Contact otal Cast Procedure by Kalman Shan, MD. Post procedure Diagnosis Wound #12: Same as Pre-Procedure Plan Follow-up Appointments: Return Appointment in 1 week. Nurse Visit as needed Bathing/ Shower/ Hygiene: May shower with wound dressing protected with water repellent cover or cast protector. No tub bath. NEELY, KAMMERER A ([id number]) [id number] Page 18 of 21 Anesthetic (Use 'Patient Medications' Section for Anesthetic Order Entry): Lidocaine applied to wound bed Edema Control - Lymphedema / Segmental Compressive Device / Other: Elevate, Exercise Daily and Avoid Standing for Long Periods of Time. Elevate legs to the level of the heart and pump ankles as often as possible Elevate leg(s) parallel to the floor when sitting. Off-Loading: T Contact Cast to Left Lower Extremity otal T Contact Cast to Right Lower Extremity - applied to left foot otal Open toe surgical shoe - Heel off loader - Right foot Other: - keep pressure off of feet. Medications-Please add to medication list.: Wound #12 Left,Medial,Plantar Foot: Keystone Compound - Keystone bilat feet WOUND #11: - Foot Wound Laterality: Plantar, Right Cleanser: Dakin 16 (oz) 0.25 1 x Per Day/30 Days Discharge Instructions: Use as directed. Cleanser: Soap and Water 1 x Per Day/30 Days Discharge Instructions: Gently cleanse wound with antibacterial soap, rinse and pat dry prior to dressing wounds Topical: keystone gel 1 x Per Day/30 Days Prim Dressing: Aquacel Extra  Hydrofiber Dressing, 2x2 (in/in) 1 x Per Day/30 Days ary Secondary Dressing: ABD Pad 5x9 (in/in) (Generic) 1 x Per Day/30 Days Discharge Instructions: Cover with ABD pad Secured With: Medipore T - 62M Medipore H Soft Cloth Surgical T ape ape, 2x2 (in/yd) (Generic) 1 x Per Day/30 Days Secured With: Kerlix Roll Sterile or Non-Sterile 6-ply 4.5x4 (yd/yd) (Generic) 1 x Per Day/30 Days Discharge Instructions: Apply Kerlix as directed WOUND #12: - Foot Wound Laterality: Plantar, Left, Medial Cleanser: Dakin 16 (oz) 0.25 (Dispense As Written) 1 x Per Day/30 Days Discharge Instructions: Use as directed. Topical: Vitamin AandD Ointment, 4(oz) tube 1 x Per Day/30 Days Discharge Instructions: apply to red, broken down area Prim Dressing: Hydrofera Blue Ready  Transfer Foam, 2.5x2.5 (in/in) 1 x Per Day/30 Days ary Discharge Instructions: Apply Hydrofera Blue Ready to wound bed as directed Prim Dressing: Keystone gel 1 x Per Day/30 Days ary Secondary Dressing: ABD Pad 5x9 (in/in) (Generic) 1 x Per Day/30 Days Discharge Instructions: Cover with ABD pad Secured With: Medipore T - 54M Medipore H Soft Cloth Surgical T ape ape, 2x2 (in/yd) (Generic) 1 x Per Day/30 Days Secured With: Kerlix Roll Sterile or Non-Sterile 6-ply 4.5x4 (yd/yd) (Generic) 1 x Per Day/30 Days Discharge Instructions: Apply Kerlix as directed 1. In office sharp debridement 2. Keystone antibiotic with Hydrofera Blueooleft foot 3. Keystone antibiotic with silver alginateooright foot 4. T contact cast placed in standard fashion otal 5. Follow-up in 1 week Electronic Signature(s) Signed: 10/12/2022 11:07:53 AM By: Kalman Shan DO Entered By: Kalman Shan on 10/12/2022 10:40:37 -------------------------------------------------------------------------------- ROS/PFSH Details Patient Name: Date of Service: [name] 10/12/2022 9:45 A M Medical Record Number: [id number] Patient Account Number: [id number] Date of  Birth/Sex: Treating RN: Mar 03, 1976 (46 y.o. [name] Primary Care Provider: Raelene Bott Other Clinician: Massie Kluver Referring Provider: Treating Provider/Extender: Eddie North in Treatment: 30 Information Obtained From Patient Ear/Nose/Mouth/Throat Medical History: Positive for: Chronic sinus problems/congestion; Middle ear problems SARAIH, LORTON A ([id number]) [id number] Page 19 of 21 Hematologic/Lymphatic Medical History: Positive for: Anemia Respiratory Medical History: Positive for: Chronic Obstructive Pulmonary Disease (COPD) Cardiovascular Medical History: Positive for: Congestive Heart Failure - EF 33% Endocrine Medical History: Positive for: Type II Diabetes Time with diabetes: 14 years Treated with: Insulin, Oral agents Blood sugar tested every day: No Blood sugar testing results: Bedtime: 176 Genitourinary Medical History: Positive for: End Stage Renal Disease Integumentary (Skin) Medical History: Positive for: History of pressure wounds Neurologic Medical History: Positive for: Neuropathy - feel and legs HBO Extended History Items Ear/Nose/Mouth/Throat: Ear/Nose/Mouth/Throat: Chronic sinus Middle ear problems problems/congestion Immunizations Pneumococcal Vaccine: Received Pneumococcal Vaccination: No Implantable Devices No devices added Family and Social History Current every day smoker; Marital Status - Single; Alcohol Use: Never; Drug Use: Prior History; Caffeine Use: Daily Electronic Signature(s) Signed: 10/12/2022 11:07:53 AM By: Kalman Shan DO Signed: 10/13/2022 3:46:10 PM By: Gretta Cool, BSN, RN, CWS, [name] RN, BSN Entered By: Kalman Shan on 10/12/2022 10:41:27 Nile Dear A ([id number]) [id number] Page 20 of 21 -------------------------------------------------------------------------------- Total Contact Cast Details Patient Name: Date of  Service: OREAN, GIARRATANO 10/12/2022 9:45 A M Medical Record Number: [id number] Patient Account Number: [id number] Date of Birth/Sex: Treating RN: 18-Apr-1976 (46 y.o. [name] Primary Care Provider: Raelene Bott Other Clinician: Massie Kluver Referring Provider: Treating Provider/Extender: Eddie North in Treatment: 57 T Contact Cast Applied for Wound Assessment: otal Wound #12 Left,Medial,Plantar Foot Performed By: Physician Kalman Shan, MD Post Procedure Diagnosis Same as Pre-procedure Electronic Signature(s) Signed: 10/12/2022 11:07:53 AM By: Kalman Shan DO Signed: 10/14/2022 9:48:27 AM By: Massie Kluver Entered By: Massie Kluver on 10/12/2022 10:23:29 -------------------------------------------------------------------------------- SuperBill Details Patient Name: Date of Service: Michel Harrow 10/12/2022 Medical Record Number: [id number] Patient Account Number: [id number] Date of Birth/Sex: Treating RN: April 17, 1976 (46 y.o. 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.621 Type 2 diabetes mellitus with foot ulcer E11.42 Type 2 diabetes  mellitus with diabetic polyneuropathy L97.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 of skin Facility Procedures : CPT4 Code: [id number] Description: 74163 - DEB SUBQ TISSUE 20 SQ CM/< ICD-10 Diagnosis Description 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 seve L97.512 Non-pressure chronic ulcer of other part of right foot with fat layer exposed   E11.621 Type 2 diabetes mellitus with foot ulcer Modifier: rity Quantity: 1 Physician Procedures Electronic Signature(s) Signed: 10/12/2022 11:07:53 AM By: Kalman Shan DO Entered By: Kalman Shan on 10/12/2022 10:41:05

## 2022-10-14 NOTE — Progress Notes (Signed)
Heather, [name] [name] ([id number]) [id number] Page 1 of 10 Visit Report for 10/12/2022 Arrival Information Details Patient Name: Date of Service: [name], [name] 10/12/2022 9:45 [name]: [id number] Patient Account Number: [id number] Date of Birth/Sex: Treating RN: 10/24/1976 (46 y.o. [name], [name] Primary Care Krystal Delduca: Raelene Bott Other Clinician: Massie Kluver Referring Gabriella Woodhead: Treating Analleli Gierke/Extender: Eddie North in Treatment: 82 Visit Information History Since Last Visit All ordered tests and consults were completed: No Patient Arrived: Wheel Chair Added or deleted any medications: No Arrival Time: 09:44 Any new allergies or adverse reactions: No Transfer Assistance: EasyPivot Patient Lift Had [name] fall or experienced change in No Patient Requires Transmission-Based Precautions: No activities of daily living that may affect Patient Has Alerts: No risk of falls: Signs or symptoms of abuse/neglect since last visito No Hospitalized since last visit: Yes Implantable device outside of the clinic excluding No cellular tissue based products placed in the center since last visit: Has Dressing in Place as Prescribed: Yes Pain Present Now: Yes Electronic Signature(s) Signed: 10/14/2022 9:48:27 AM By: Massie Kluver Entered By: Massie Kluver on 10/12/2022 09:53:18 -------------------------------------------------------------------------------- Clinic Level of Care Assessment Details Patient Name: Date of Service: [name], [name] 10/12/2022 9:45 [name]: [id number] Patient Account Number: [id number] Date of Birth/Sex: Treating RN: Aug 03, 1976 (46 y.o. [name] Primary Care Tomasz Steeves: Raelene Bott Other Clinician: Massie Kluver Referring Vick Filter: Treating Kimorah Ridolfi/Extender: Eddie North in Treatment: 30 Clinic Level of Care Assessment Items TOOL 1  Quantity Score _0  - 0 Use when EandM and Procedure is performed on INITIAL visit ASSESSMENTS - Nursing Assessment / Reassessment _1  - 0 General Physical Exam (combine w/ comprehensive assessment (listed just below) when performed on new pt. evals) _2  - 0 Comprehensive Assessment (HX, ROS, Risk Assessments, Wounds Hx, etc.) Seufert, Ellizabeth [name] ([id number]) [id number] Page 2 of 10 ASSESSMENTS - Wound and Skin Assessment / Reassessment _3  - 0 Dermatologic / Skin Assessment (not related to wound area) ASSESSMENTS - Ostomy and/or Continence Assessment and Care _4  - 0 Incontinence Assessment and Management _5  - 0 Ostomy Care Assessment and Management (repouching, etc.) PROCESS - Coordination of Care _6  - 0 Simple Patient / Family Education for ongoing care _7  - 0 Complex (extensive) Patient / Family Education for ongoing care _8  - 0 Staff obtains Programmer, systems, Records, T Results / Process Orders est _9  - 0 Staff telephones HHA, Nursing Homes / Clarify orders / etc _10  - 0 Routine Transfer to another Facility (non-emergent condition) _11  - 0 Routine Hospital Admission (non-emergent condition) _12  - 0 New Admissions / Biomedical engineer / Ordering NPWT Apligraf, etc. , _13  - 0 Emergency Hospital Admission (emergent condition) PROCESS - Special Needs _14  - 0 Pediatric / Minor Patient Management _15  - 0 Isolation Patient Management _16  - 0 Hearing / Language / Visual special needs _17  - 0 Assessment of Community assistance (transportation, D/C planning, etc.) _18  - 0 Additional assistance / Altered mentation _19  - 0 Support Surface(s) Assessment (bed, cushion, seat, etc.) INTERVENTIONS - Miscellaneous _20  - 0 External ear exam _21  - 0 Patient Transfer (multiple staff / Civil Service fast streamer / Similar devices) _22  - 0 Simple Staple / Suture removal (25 or less) _23  - 0 Complex Staple / Suture removal (26 or more) _24  - 0 Hypo/Hyperglycemic Management (do not check if  billed separately) _25  - 0 Ankle / Brachial Index (ABI) - do not check if billed separately Has the patient been seen at the hospital  within the last three years: Yes Total Score: 0 Level Of Care: ____ Electronic Signature(s) Signed: 10/14/2022 9:48:27 AM By: Massie Kluver Entered By: Massie Kluver on 10/12/2022 10:25:42 -------------------------------------------------------------------------------- Encounter Discharge Information Details Patient Name: Date of Service: [name] 10/12/2022 9:45 [name]: [id number] Patient Account Number: [id number] Date of Birth/Sex: Treating RN: 1976/10/22 (46 y.o. [name] Primary Care Grantham Hippert: Raelene Bott Other Clinician: Massie Kluver Referring Vinayak Bobier: Treating Shahed Yeoman/Extender: Graciella, Arment [name] ([id number]) [id number] Page 3 of 10 Weeks in Treatment: 30 Encounter Discharge Information Items Post Procedure Vitals Discharge Condition: Stable Temperature (F): 98.7 Ambulatory Status: Wheelchair Pulse (bpm): 71 Discharge Destination: Home Respiratory Rate (breaths/min): 16 Transportation: Private Auto Blood Pressure (mmHg): 144/82 Accompanied By: mother Schedule Follow-up Appointment: Yes Clinical Summary of Care: Electronic Signature(s) Signed: 10/14/2022 9:48:27 AM By: Massie Kluver Entered By: Massie Kluver on 10/12/2022 11:02:29 -------------------------------------------------------------------------------- Lower Extremity Assessment Details Patient Name: Date of Service: [name], [name] 10/12/2022 9:45 [name]: [id number] Patient Account Number: [id number] Date of Birth/Sex: Treating RN: [birth year] (46 y.o. [name] Primary Care Malayja Freund: Raelene Bott Other Clinician: Massie Kluver Referring Georges Victorio: Treating Trinita Devlin/Extender: Eddie North in Treatment: 30 Electronic  Signature(s) Signed: 10/13/2022 3:46:10 PM By: Gretta Cool BSN, RN, CWS, [name] RN, BSN Signed: 10/14/2022 9:48:27 AM By: Massie Kluver Entered By: Massie Kluver on 10/12/2022 10:07:56 -------------------------------------------------------------------------------- Multi Wound Chart Details Patient Name: Date of Service: [name] 10/12/2022 9:45 [name]: [id number] Patient Account Number: [id number] Date of Birth/Sex: Treating RN: August 29, 1976 (46 y.o. [name] Primary Care Lusine Corlett: Raelene Bott Other Clinician: Massie Kluver Referring Gregg Winchell: Treating Ransom Nickson/Extender: Eddie North in Treatment: 30 Vital Signs Height(in): 69 Pulse(bpm): 71 Weight(lbs): 178 Blood Pressure(mmHg): 144/82 Body Mass Index(BMI): 26.3 Temperature(F): 98.7 Respiratory Rate(breaths/min): 16 [Bardales, Laquanta [name] ([id number]):Photos:] [[id number].pdf Page 4 of 10:11 12 N/[name] N/[name]] Right, Plantar Foot Left, Medial, Plantar Foot N/[name] Wound Location: Surgical Injury Pressure Injury N/[name] Wounding Event: Open Surgical Wound 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: Extremity Chronic sinus problems/congestion, Chronic sinus problems/congestion, N/[name] Comorbid History: Middle ear problems, Anemia, Chronic Middle ear problems, Anemia, Chronic Obstructive Pulmonary Disease Obstructive Pulmonary Disease (COPD), Congestive Heart Failure, (COPD), Congestive Heart Failure, Type II Diabetes, End Stage Renal Type II Diabetes, End Stage Renal Disease, History of pressure wounds, Disease, History of pressure wounds, Neuropathy Neuropathy 12/01/2021 03/16/2020 N/[name] Date Acquired: 30 30 N/[name] Weeks of Treatment: Open Open N/[name] Wound Status: No No N/[name] Wound Recurrence: 0.2x0.4x0.6 1.4x0.8x0.1 N/[name] Measurements L x W x D (cm) 0.063 0.88 N/[name] [name] (cm) : rea 0.038 0.088 N/[name] Volume (cm) : 96.60% 81.30% N/[name] % Reduction in Area: 97.70%  90.70% N/[name] % Reduction in Volume: Full Thickness Without Exposed Grade 3 N/[name] Classification: Support Structures Medium Medium N/[name] Exudate Amount: Serosanguineous Serous N/[name] Exudate Type: red, brown amber N/[name] Exudate Color: Distinct, outline attached Flat and Intact N/[name] Wound Margin: Medium (34-66%) Large (67-100%) N/[name] Granulation Amount: Pink, Pale Red, Pink N/[name] Granulation Quality: Medium (34-66%) None Present (0%) N/[name] Necrotic Amount: Fat Layer (Subcutaneous Tissue): Yes Fat Layer (Subcutaneous Tissue): Yes N/[name] Exposed Structures: Fascia: No Fascia: No Tendon: No Tendon: No Muscle: No Muscle: No Joint: No Joint: No Bone: No Bone: No Medium (34-66%) Small (1-33%) N/[name] Epithelialization: Treatment Notes Electronic Signature(s) Signed: 10/14/2022 9:48:27 AM By: Massie Kluver Entered By: Massie Kluver on 10/12/2022 10:13:38 -------------------------------------------------------------------------------- Multi-Disciplinary Care Plan Details Patient Name: Date  of Service: [name], [name] 10/12/2022 9:45 [name]: [id number] Patient Account Number: [id number] Date of Birth/Sex: Treating RN: 1976/08/27 (46 y.o. [name] Primary Care Talisa Petrak: Raelene Bott Other Clinician: Massie Kluver Referring Scottlynn Lindell: Treating Shemaiah Round/Extender: Eddie North in Treatment: [address] TUN[place], HELD [name] ([id number]) [id number] Page 5 of 10 Abuse / Safety / Falls / Self Care Management Nursing Diagnoses: History of Falls Potential for falls Potential for injury related to falls Goals: Patient will not develop complications from immobility Date Initiated: 03/17/2022 Date Inactivated: 04/20/2022 Target Resolution Date: 03/16/2022 Goal Status: Met Patient/caregiver will verbalize understanding of skin care regimen Date Initiated: 03/17/2022 Date Inactivated: 09/28/2022 Target Resolution Date:  03/16/2022 Goal Status: Met Patient/caregiver will verbalize/demonstrate measure taken to improve self care Date Initiated: 03/17/2022 Target Resolution Date: 03/16/2022 Goal Status: Active Interventions: Assess fall risk on admission and as needed Provide education on basic hygiene Provide education on personal and home safety Notes: Necrotic Tissue Nursing Diagnoses: Impaired tissue integrity related to necrotic/devitalized tissue Knowledge deficit related to management of necrotic/devitalized tissue Goals: Necrotic/devitalized tissue will be minimized in the wound bed Date Initiated: 03/17/2022 Target Resolution Date: 10/26/2022 Goal Status: Active Patient/caregiver will verbalize understanding of reason and process for debridement of necrotic tissue Date Initiated: 03/17/2022 Date Inactivated: 09/28/2022 Target Resolution Date: 03/16/2022 Goal Status: Met Interventions: Provide education on necrotic tissue and debridement process Treatment Activities: Apply topical anesthetic as ordered : 03/16/2022 Excisional debridement : 03/16/2022 Notes: Osteomyelitis Nursing Diagnoses: Infection: osteomyelitis Knowledge deficit related to disease process and management Goals: Patient/caregiver will verbalize understanding of disease process and disease management Date Initiated: 03/17/2022 Target Resolution Date: 03/16/2022 Goal Status: Active Patient's osteomyelitis will resolve Date Initiated: 03/17/2022 Target Resolution Date: 10/26/2022 Goal Status: Active Signs and symptoms for osteomyelitis will be recognized and promptly addressed Date Initiated: 03/17/2022 Target Resolution Date: 10/26/2022 Goal Status: Active Interventions: Assess for signs and symptoms of osteomyelitis resolution every visit Provide education on osteomyelitis Treatment Activities: Systemic antibiotics : 03/16/2022 Notes: Patient on antibiotics. GWENDALYN, [name] [name] ([id number]) [id number]  Page 6 of 10 Electronic Signature(s) Signed: 10/13/2022 3:46:10 PM By: Gretta Cool, BSN, RN, CWS, [name] RN, BSN Signed: 10/14/2022 9:48:27 AM By: Massie Kluver Entered By: Massie Kluver on 10/12/2022 10:51:47 -------------------------------------------------------------------------------- Pain Assessment Details Patient Name: Date of Service: [name] 10/12/2022 9:45 [name]: [id number] Patient Account Number: [id number] Date of Birth/Sex: Treating RN: April 20, 1976 (46 y.o. [name] Primary Care Joellyn Grandt: Raelene Bott Other Clinician: Massie Kluver Referring Emersen Carroll: Treating Tasmine Hipwell/Extender: Eddie North in Treatment: 30 Active Problems Location of Pain Severity and Description of Pain Patient Has Paino Yes Site Locations Pain Location: Pain in Ulcers Duration of the Pain. Constant / Intermittento Constant Rate the pain. Current Pain Level: 8 Character of Pain Describe the Pain: Stabbing, Throbbing Pain Management and Medication Current Pain Management: Medication: Yes Cold Application: No Rest: No Massage: No Activity: No T.E.N.S.: No Heat Application: No Leg drop or elevation: No Is the Current Pain Management Adequate: Inadequate How does your wound impact your activities of daily livingo Sleep: No Bathing: No Appetite: No Relationship With Others: No Bladder Continence: No Emotions: No Bowel Continence: No Work: No Toileting: No Drive: No Dressing: No Hobbies: No Engineer, maintenance) Signed: 10/13/2022 3:46:10 PM By: Gretta Cool, BSN, RN, CWS, [name] RN, BSN Signed: 10/14/2022 9:48:27 AM By: Massie Kluver Entered By: Massie Kluver on 10/12/2022 09:53:50 [name] Dear [name] ([id number]) [id number] Page 7 of 10 --------------------------------------------------------------------------------  Patient/Caregiver Education Details Patient Name: Date of Service: [name], [name]  12/20/2023andnbsp9:45 [name]: [id number] Patient Account Number: [id number] Date of Birth/Gender: Treating RN: 19-May-1976 (46 y.o. [name] Primary Care Physician: Raelene Bott Other Clinician: Massie Kluver Referring Physician: Treating Physician/Extender: Eddie North in Treatment: 30 Education Assessment Education Provided To: Patient Education Topics Provided Wound/Skin Impairment: Handouts: Other: continue wound care as directed Methods: Explain/Verbal Responses: State content correctly Electronic Signature(s) Signed: 10/14/2022 9:48:27 AM By: Massie Kluver Entered By: Massie Kluver on 10/12/2022 10:51:41 -------------------------------------------------------------------------------- Wound Assessment Details Patient Name: Date of Service: [name], [name] 10/12/2022 9:45 [name]: [id number] Patient Account Number: [id number] Date of Birth/Sex: Treating RN: April 19, 1976 (46 y.o. [name] Primary Care Rhiana Morash: Raelene Bott Other Clinician: Massie Kluver Referring Kayson Bullis: Treating Branden Vine/Extender: Linna Hoff Weeks in Treatment: 30 Wound Status Wound Number: 11 Primary Open Surgical Wound Etiology: Wound Location: Right, Plantar Foot Wound Open Wounding Event: Surgical Injury Status: Date Acquired: 12/01/2021 Comorbid Chronic sinus problems/congestion, Middle ear problems, Anemia, Weeks Of Treatment: 30 History: Chronic Obstructive Pulmonary Disease (COPD), Congestive Heart Clustered Wound: No Failure, Type II Diabetes, End Stage Renal Disease, History of pressure wounds, Neuropathy Photos SANDI, TOWE [name] ([id number]) [id number] Page 8 of 10 Wound Measurements Length: (cm) 0.2 Width: (cm) 0.4 Depth: (cm) 0.6 Area: (cm) 0.063 Volume: (cm) 0.038 % Reduction in Area: 96.6% % Reduction in Volume: 97.7% Epithelialization: Medium  (34-66%) Tunneling: No Undermining: No Wound Description Classification: Full Thickness Without Exposed Suppo Wound Margin: Distinct, outline attached Exudate Amount: Medium Exudate Type: Serosanguineous Exudate Color: red, brown rt Structures Foul Odor After Cleansing: No Slough/Fibrino Yes Wound Bed Granulation Amount: Medium (34-66%) Exposed Structure Granulation Quality: Pink, Pale Fascia Exposed: No Necrotic Amount: Medium (34-66%) Fat Layer (Subcutaneous Tissue) Exposed: Yes Tendon Exposed: No Muscle Exposed: No Joint Exposed: No Bone Exposed: No Treatment Notes Wound #11 (Foot) Wound Laterality: Plantar, Right Cleanser Dakin 16 (oz) 0.25 Discharge Instruction: Use as directed. Soap and Water Discharge Instruction: Gently cleanse wound with antibacterial soap, rinse and pat dry prior to dressing wounds Peri-Wound Care Topical keystone gel Primary Dressing Aquacel Extra Hydrofiber Dressing, 2x2 (in/in) Secondary Dressing ABD Pad 5x9 (in/in) Discharge Instruction: Cover with ABD pad Secured With Medipore T - 28M Medipore H Soft Cloth Surgical T ape ape, 2x2 (in/yd) Kerlix Roll Sterile or Non-Sterile 6-ply 4.5x4 (yd/yd) Discharge Instruction: Apply Kerlix as directed Compression Wrap Compression Stockings Add-Ons Electronic Signature(s) Signed: 10/13/2022 3:46:10 PM By: Gretta Cool, BSN, RN, CWS, [name] RN, BSN Signed: 10/14/2022 9:48:27 AM By: Massie Kluver Entered By: Massie Kluver on 10/12/2022 10:07:17 [name] Dear [name] ([id number]) [id number] Page 9 of 10 -------------------------------------------------------------------------------- Wound Assessment Details Patient Name: Date of Service: [name], [name] 10/12/2022 9:45 [name]: [id number] Patient Account Number: [id number] Date of Birth/Sex: Treating RN: 10-Feb-1976 (46 y.o. [name], [name] Primary Care Matheus Spiker: Raelene Bott Other Clinician: Massie Kluver Referring Deangleo Passage: Treating Kendle Turbin/Extender: Linna Hoff Weeks in Treatment: 30 Wound Status Wound Number: 12 Primary Diabetic Wound/Ulcer of the Lower Extremity Etiology: Wound Location: Left, Medial, Plantar Foot Wound Open Wounding Event: Pressure Injury Status: Date Acquired: 03/16/2020 Comorbid Chronic sinus problems/congestion, Middle ear problems, Anemia, Weeks Of Treatment: 30 History: Chronic Obstructive Pulmonary Disease (COPD), Congestive Heart Clustered Wound: No Failure, Type II Diabetes, End Stage Renal Disease, History of pressure wounds, Neuropathy Photos Wound Measurements Length: (cm) Width: (cm) Depth: (cm) Area: (cm) Volume: (cm) 1.4 %  Reduction in Area: 81.3% 0.8 % Reduction in Volume: 90.7% 0.1 Epithelialization: Small (1-33%) 0.88 Tunneling: No 0.088 Undermining: No Wound Description Classification: Grade 3 Wound Margin: Flat and Intact Exudate Amount: Medium Exudate Type: Serous Exudate Color: amber Foul Odor After Cleansing: No Slough/Fibrino No Wound Bed Granulation Amount: Large (67-100%) Exposed Structure Granulation Quality: Red, Pink Fascia Exposed: No Necrotic Amount: None Present (0%) Fat Layer (Subcutaneous Tissue) Exposed: Yes Tendon Exposed: No Muscle Exposed: No Joint Exposed: No Bone Exposed: No Treatment Notes Wound #12 (Foot) Wound Laterality: Plantar, Left, Medial Cleanser Dakin 16 (oz) 0.25 Discharge Instruction: Use as directed. LANISE, MERGEN [name] ([id number]) [id number] Page 10 of 10 Peri-Wound Care Topical Vitamin AandD Ointment, 4(oz) tube Discharge Instruction: apply to red, broken down area Primary Dressing Hydrofera Blue Ready Transfer Foam, 2.5x2.5 (in/in) Discharge Instruction: Apply Hydrofera Blue Ready to wound bed as directed Keystone gel Secondary Dressing ABD Pad 5x9 (in/in) Discharge Instruction: Cover with ABD pad Secured With [place] Surgical T ape ape, 2x2 (in/yd) Kerlix Roll Sterile or Non-Sterile 6-ply 4.5x4 (yd/yd) Discharge Instruction: Apply Kerlix as directed Compression Wrap Compression Stockings Add-Ons Electronic Signature(s) Signed: 10/13/2022 3:46:10 PM By: Gretta Cool, BSN, RN, CWS, [name] RN, BSN Signed: 10/14/2022 9:48:27 AM By: Massie Kluver Entered By: Massie Kluver on 10/12/2022 10:07:46 -------------------------------------------------------------------------------- Gary Details Patient Name: Date of Service: [name] 10/12/2022 9:45 [name]: [id number] Patient Account Number: [id number] Date of Birth/Sex: Treating RN: 1975-12-29 (46 y.o. [name], [name] Primary Care Totiana Everson: Raelene Bott Other Clinician: Massie Kluver Referring Israel Werts: Treating Thais Silberstein/Extender: Eddie North in Treatment: 30 Vital Signs Time Taken: 09:53 Temperature (F): 98.7 Height (in): 69 Pulse (bpm): 71 Weight (lbs): 178 Respiratory Rate (breaths/min): 16 Body Mass Index (BMI): 26.3 Blood Pressure (mmHg): 144/82 Reference Range: 80 - 120 mg / dl Electronic Signature(s) Signed: 10/14/2022 9:48:27 AM By: Massie Kluver Entered By: Massie Kluver on 10/12/2022 09:56:32

## 2022-10-19 ENCOUNTER — Encounter: Payer: Medicaid Other | Admitting: Internal Medicine

## 2022-10-19 DIAGNOSIS — E11621 Type 2 diabetes mellitus with foot ulcer: Secondary | ICD-10-CM | POA: Diagnosis not present

## 2022-10-26 ENCOUNTER — Encounter: Payer: Medicaid Other | Attending: Internal Medicine | Admitting: Internal Medicine

## 2022-10-26 DIAGNOSIS — F17210 Nicotine dependence, cigarettes, uncomplicated: Secondary | ICD-10-CM | POA: Insufficient documentation

## 2022-10-26 DIAGNOSIS — L97512 Non-pressure chronic ulcer of other part of right foot with fat layer exposed: Secondary | ICD-10-CM | POA: Diagnosis not present

## 2022-10-26 DIAGNOSIS — E11621 Type 2 diabetes mellitus with foot ulcer: Secondary | ICD-10-CM

## 2022-10-26 DIAGNOSIS — L97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 of skin: Secondary | ICD-10-CM | POA: Diagnosis not present

## 2022-10-26 DIAGNOSIS — E1142 Type 2 diabetes mellitus with diabetic polyneuropathy: Secondary | ICD-10-CM | POA: Insufficient documentation

## 2022-10-26 DIAGNOSIS — L97528 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ity: Secondary | ICD-10-CM | POA: Diagnosis not present

## 2022-10-26 DIAGNOSIS — Z79899 Other long term (current) drug therapy: Secondary | ICD-10-CM | POA: Insufficient documentation

## 2022-10-26 NOTE — Progress Notes (Signed)
BLANCHE, SCOVELL A ([id number]) [id number] Page 1 of 19 Visit Report for 10/26/2022 Chief Complaint Document Details Patient Name: Date of Service: [name] 10/26/2022 11:00 A M Medical Record Number: [id number] Patient Account Number: [id number] Date of Birth/Sex: Treating RN: 08-Aug-1976 (47 y.o. [name] Primary Care Provider: Raelene Bott Other Clinician: Massie Kluver Referring Provider: Treating Provider/Extender: Eddie North in Treatment: 58 Information Obtained from: Patient Chief Complaint 03/19/2021; patient referred by Dr. Luana Shu who has been looking after her left foot for quite a period of time for review of a nonhealing area in the left midfoot 03/12/2022; bilateral feet wounds and right lower extremity wound. Electronic Signature(s) Signed: 10/26/2022 1:46:18 PM By: Kalman Shan DO Entered By: Kalman Shan on 10/26/2022 12:05:26 -------------------------------------------------------------------------------- Debridement Details Patient Name: Date of Service: Heather Hitch A. 10/26/2022 11:00 A M Medical Record Number: [id number] Patient Account Number: [id number] Date of Birth/Sex: Treating RN: April 13, 1976 (47 y.o. [name] Primary Care Provider: Raelene Bott Other Clinician: Massie Kluver Referring Provider: Treating Provider/Extender: Eddie North in Treatment: 32 Debridement Performed for Assessment: Wound #11 Right,Plantar Foot Performed By: Physician Kalman Shan, MD Debridement Type: Debridement Level of Consciousness (Pre-procedure): Awake and Alert Pre-procedure Verification/Time Out Yes - 11:42 Taken: Start Time: 11:42 T Area Debrided (L x W): otal 1 (cm) x 7 (cm) = 7 (cm) Tissue and other material debrided: Viable, Non-Viable, Callus, Slough, Subcutaneous, Slough Level: Skin/Subcutaneous Tissue Debridement Description: Excisional Instrument:  Curette Bleeding: Minimum Hemostasis Achieved: Pressure Response to Treatment: Procedure was tolerated well Level of Consciousness (Post- Awake and Alert procedure): KIASIA, CHOU A ([id number]) [id number] Page 2 of 19 Post Debridement Measurements of Total Wound Length: (cm) 0.1 Width: (cm) 0.1 Depth: (cm) 0.9 Volume: (cm) 0.007 Character of Wound/Ulcer Post Debridement: Stable Post Procedure Diagnosis Same as Pre-procedure Electronic Signature(s) Signed: 10/26/2022 1:46:18 PM By: Kalman Shan DO Signed: 10/26/2022 2:03:30 PM By: Massie Kluver Signed: 10/26/2022 4:52:31 PM By: Gretta Cool, BSN, RN, CWS, [name] RN, BSN Entered By: Massie Kluver on 10/26/2022 11:43:35 -------------------------------------------------------------------------------- Debridement Details Patient Name: Date of Service: Heather Hitch A. 10/26/2022 11:00 A M Medical Record Number: [id number] Patient Account Number: [id number] Date of Birth/Sex: Treating RN: 1975-11-18 (47 y.o. [name] Primary Care Provider: Raelene Bott Other Clinician: Massie Kluver Referring Provider: Treating Provider/Extender: Eddie North in Treatment: 32 Debridement Performed for Assessment: Wound #12 Left,Medial,Plantar Foot Performed By: Physician Kalman Shan, MD Debridement Type: Debridement Severity of Tissue Pre Debridement: Fat layer exposed Level of Consciousness (Pre-procedure): Awake and Alert Pre-procedure Verification/Time Out Yes - 11:46 Taken: Start Time: 11:46 T Area Debrided (L x W): otal 2 (cm) x 1.5 (cm) = 3 (cm) Tissue and other material debrided: Viable, Non-Viable, Callus, Slough, Subcutaneous, Slough Level: Skin/Subcutaneous Tissue Debridement Description: Excisional Instrument: Curette Bleeding: Minimum Hemostasis Achieved: Pressure Response to Treatment: Procedure was tolerated well Level of Consciousness (Post- Awake and  Alert procedure): Post Debridement Measurements of Total Wound Length: (cm) 1.2 Width: (cm) 0.7 Depth: (cm) 0.1 Volume: (cm) 0.066 Character of Wound/Ulcer Post Debridement: Stable Severity of Tissue Post Debridement: Fat layer exposed Post Procedure Diagnosis Same as Pre-procedure Electronic Signature(s) Signed: 10/26/2022 1:46:18 PM By: Kalman Shan DO Signed: 10/26/2022 2:03:30 PM By: Massie Kluver Signed: 10/26/2022 4:52:31 PM By: Gretta Cool, BSN, RN, CWS, [name] RN, BSN Entered By: Massie Kluver on 10/26/2022 11:49:15 Nile Dear A ([id number]) [id number] Page 3 of 19 -------------------------------------------------------------------------------- HPI Details Patient Name: Date of Service: [name]  10/26/2022 11:00 A M Medical Record Number: [id number] Patient Account Number: [id number] Date of Birth/Sex: Treating RN: November 09, 1975 (47 y.o. [name] Primary Care Provider: Raelene Bott Other Clinician: Massie Kluver Referring Provider: Treating Provider/Extender: Eddie North in Treatment: 37 History of Present Illness HPI Description: 01/18/18-She is here for initial evaluation of the left great toe ulcer. She is a poor historian in regards to timeframe in detail. She states approximately 4 weeks ago she lacerated her toe on something in the house. She followed up with her primary care who placed her on Bactrim and ultimately a second dose of Bactrim prior to coming to wound clinic. She states she has been treating the toe with peroxide, Betadine and a Band-Aid. She did not check her blood sugar this morning but checked it yesterday morning it was 327; she is unaware of a recent A1c and there are no current records. She saw Dr. she would've orthopedics last week for an old injury to the left ankle, she states he did not see her toe, nor did she bring it to his attention. She smokes approximately 1 pack cigarettes a  day. Her social situation is concerning, she arrives this morning with her mother who appears extremely intoxicated/under the influence; her mother was asked to leave the room and be monitored by the patient's grandmother. The patient's aunt then accompanied the patient and the room throughout the rest of the appointment. We had a lengthy discussion regarding the deleterious effects of uncontrolled hyperglycemia and smoking as it relates to wound healing and overall health. She was strongly encouraged to decrease her smoking and get her diabetes under better control. She states she is currently on a diet and has cut down her [facility] consumption. The left toe is erythematous, macerated and slightly edematous with malodor present. The edema in her left foot is below her baseline, there is no erythema streaking. We will treat her with Santyl, doxycycline; we have ordered and xray, culture and provided a Peg assist surgical shoe and cultured the wound. 01/25/18-She is here in follow-up evaluation for a left great toe ulcer and presents with an abscess to her suprapubic area. She states her blood sugars remain elevated, feeling "sick" and if levels are below 250, but she is trying. She has made no attempt to decrease her smoking stating that we "can't take away her food in her cigarettes". She has been compliant with offloading using the PEG assist you. She is using Santyl daily. the culture obtained last week grew staph aureus and Enterococcus faecalis; continues on the doxycycline and Augmentin was added on Monday. The suprapubic area has erythema, no femoral variation, purple discoloration, minimal induration, was accessed with a cotton tip applicator with sanguinopurulent drainage, this was cultured, I suspect the current antibiotic treatment will cover and we will not add anything to her current treatment plan. She was advised to go to urgent care or ER with any change in redness, induration or  fever. 02/01/18-She is here in follow-up evaluation for left great toe ulcers and a new abdominal abscess from last week. She was able to use packing until earlier this week, where she "forgot it was there". She states she was feeling ill with GI symptoms last week and was not taking her antibiotic. She states her glucose levels have been predominantly less than 200, with occasional levels between 200-250. She thinks this was contributing to her GI symptoms as they have resolved without intervention. There continues to be significant  laceration to left toe, otherwise it clinically looks stable/improved. There is now less superficial opening to the lateral aspect of the great toe that was residual blister. We will transition to [facility] to all wounds, she will continue her Augmentin. If there is no change or deterioration next week for reculture. 02/08/18-She is here in follow-up evaluation for left great toe ulcer and abdominal ulcer. There is an improvement in both wounds. She has been wrapping her left toe with coban, not by our direction, which has created an area of discoloration to the medial aspect; she has been advised to NOT use coban secondary to her neuropathy. She states her glucose levels have been high over this last week ranging from 200-350, she continues to smoke. She admits to being less compliant with her offloading shoe. We will continue with same treatment plan and she will follow-up next week. 02/15/18-She is here in follow-up evaluation for left great toe ulcer and abdominal ulcer. The abdominal ulcer is epithelialized. The left great toe ulcer is improved and all injury from last week using the Coban wrap is resolved, the lateral ulcer is healed. She admits to noncompliance with wearing offloading shoe and admits to glucose levels being greater than 300 most of the week. She continues to smoke and expresses no desire to quit. There is one area medially that probes deeper than  it has historically, erythema to the toe and dorsal foot has consistently waxed and waned. There is no overt signs of cellulitis or infection but we will culture the wound for any occult infection given the new area of depth and erythema. We will hold off on sensitivities for initiation of antibiotic therapy. 02/22/18-She is here in follow up evaluation for left great toe ulcer. There is overall significant improvement in both wound appearance, erythema and edema with changes made last week. She was not initiated on antibiotic therapy. Culture obtained last week showed oxacillin sensitive staph aureus, sensitive to clindamycin. Clindamycin has been called into the pharmacy but she has been instructed to hold off on initiation secondary to overall clinical improvement and her history of antibiotic intolerance. She has been instructed to contact the clinic with any noted changes/deterioration and the wound, erythema, edema and/or pain. She will follow-up next week. She continues to smoke and her glucose levels remain elevated >250; she admits to compliance with offloading shoe 03/01/18 on evaluation today patient appears to be doing fairly well in regard to her left first toe ulcer. She has been tolerating the dressing changes with the [facility] Dressing without complication and overall this has definitely showed signs of improvement according to records as well is what the patient tells me today. I'm very pleased in that regard. She is having no pain today 03/08/18 She is here for follow up evaluation of a left great toe ulcer. She remains non-compliant with glucose control and smoking cessation; glucose levels consistently >200. She states that she got new shoe inserts/peg assist. She admits to compliance with offloading. Since my last evaluation there is significant improvement. We will switch to prisma at this time and she will follow up next week. She is noted to be tachycardic at this appointment,  heart rate 120s; she has a history of heart rate 70-130 according to our records. She admits to extreme agitation r/t personal issues; she was advised to monitor her heartrate and contact her physician if it does not return to a more normal range (<100). She takes cardizem twice daily. 03/15/18-She is here  in follow-up evaluation for left great toe ulcer. She remains noncompliant with glucose control and smoking cessation. She admits to compliance with wearing offloading shoe. The ulcer is improved/stable and we will continue with the same treatment plan and she will follow-up next week 03/22/18-She is here for evaluation for left great toe ulcer. There continues to be significant improvement despite recurrent hyperglycemia (over 500 yesterday) and she continues to smoke. She has been compliant with offloading and we will continue with same treatment plan and she will follow-up next week. 03/29/18-She is here for evaluation for left great toe ulcer. Despite continuing to smoke and uncontrolled diabetes she continues to improve. She is compliant with offloading shoe. We will continue with the same treatment plan and she will follow-up next week 04/05/18- She is here in follow up evaluation for a left great toe ulcer; she presents with small pustule to left fifth toe (resembles ant bite). She admits to compliance with wearing offloading shoe; continues to smoke or have uncontrolled blood glucose control. There is more callus than usual with evidence of bleeding; she denies known trauma. 04/12/18-She is here for evaluation of left great toe ulcer. Despite noncompliance with glycemic control and smoking she continues to make improvement. She continues to wear offloading shoe. The pustule, that was identified last week, to the left fifth toe is resolved. She will follow-up in 2 weeks 05/03/18-she is seen in follow-up evaluation for a left great toe ulcer. She is compliant with offloading, otherwise noncompliant  with glycemic control and Teaster, Sylena A ([id number]) [id number] Page 4 of 19 smoking. She has plateaued and there is minimal improvement noted. We will transition to [facility], replaced the insert to her surgical shoe and she will follow-up in one week 05/10/18- She is here in follow up evaluation for a left great toe ulcer. It appears stable despite measurement change. We will continue with same treatment plan and follow up next week. 05/24/18-She is seen in follow-up evaluation for a left great toe ulcer. She remains compliant with offloading, has made significant improvement in her diet, decreasing the amount of sugar/soda. She said her recent A1c was 10.9 which is lower than. She did see a diabetic nutritionist/educator yesterday. She continues to smoke. We will continue with the same treatment plan and she'll follow-up next week. 05/31/18- She is seen in follow-up evaluation for left great toe ulcer. She continues to remain compliant with offloading, continues to make improvement in her diet, increasing her water and decreasing the amount of sugar/soda. She does continue to smoke with no desire to quit. We will apply Prisma to the depth and Hydrofera Blue over. We have not received insurance authorization for oasis. She will follow up next week. 06/07/18-She is seen in follow-up evaluation for left great toe ulcer. It has stalled according to today's measurements although base appears stable. She says she saw a diabetic educator yesterday; her average blood sugars are less than 300 which is an improvement for her. She continues to smoke and states "that's my next step" She continues with water over soda. We will order for xray, culture and reinstate ace wrap compression prior to placing apligraf for next week. She is voicing no complaints or concerns. Her dressing will change to iodoflex over the next week in preparation for apligraf. 06/14/18-She is seen in  follow-up evaluation for left great toe ulcer. Plain film x-ray performed last week was negative for osteomyelitis. Wound culture obtained last week grew strep B and OSSA; she is initiated  on keflex and cefdinir today; there is erythema to the toe which could be from ace wrap compression, she has a history of wrapping too tight and has has been encouraged to maintain ace wraps that we place today. We will hold off on application of apligraf today, will apply next week after antibiotic therapy has been initiated. She admits today that she has resumed taking a shower with her foot/toe submerged in water, she has been reminded to keep foot/toe out of the bath water. She will be seen in follow up next week 06/21/18-she is seen in follow-up evaluation for left great toe ulcer. She is tolerating antibiotic therapy with no GI disturbance. The wound is stable. Apligraf was applied today. She has been decreasing her smoking, only had 4 cigarettes yesterday and 1 today. She continues being more compliant in diabetic diet. She will follow-up next week for evaluation of site, if stable will remove at 2 weeks. 06/28/18- She is here in follow up evalution. Apligraf was placed last week, she states the dressing fell off on Tuesday and she was dressing with hydrofera blue. She is healed and will be discharged from the clinic today. She has been instructed to continue with smoking cessation, continue monitoring glucose levels, offloading for an additional 4 weeks and continue with hydrofera blue for additional two weeks for any possible microscopic opening. Readmission: 08/07/18 on evaluation today patient presents for reevaluation concerning the ulcer of her right great toe. She was previously discharged on 06/28/18 healed. Nonetheless she states that this began to show signs of drainage she subsequently went to her primary care provider. Subsequently an x-ray was performed on 08/01/18 which was negative. The patient was  also placed on antibiotics at that time. Fortunately they should have been effective for the infection. Nonetheless she's been experiencing some improvement but still has a lot of drainage coming from the wound itself. 08/14/18 on evaluation today patient's wound actually does show signs of improvement in regard to the erythema at this point. She has completed the antibiotics. With that being said we did discuss the possibility of placing her in a total contact cast as of today although I think that I may want to give this just a little bit more time to ensure nothing recurrence as far as her infection is concerned. I do not want to put in the cast and risk infection at that time if things are not completely resolved. With that being said she is gonna require some debridement today. 08/21/18 on evaluation today patient actually appears to be doing okay in regard to her toe ulcer. She's been tolerating the dressing changes without complication. With that being said it does appear that she is ready and in fact I think it's appropriate for [place] to go ahead and initiate the total contact cast today. Nonetheless she will require some sharp debridement to prepare the wound for application. Overall I feel like things have been progressing well but we do need to do something to get this to close more readily. 08/24/18 patient seen today for reevaluation after having had the total contact cast applied on Tuesday. She seems to have done very well the wound appears to be doing great and overall I'm pleased with the progress that she's made. There were no abnormal areas of rubbing from the cast on her lower extremity. 08/30/18 on evaluation today patient actually appears to be completely healed in regard to her plantar toe ulcer. She tells me at this point she's been having  a lot of issues with the cast. She almost fell a couple of times the state shall the step of her dog a couple times as well. This is been a very  frustrating process for her other nonetheless she has completely healed the wound which is excellent news. Overall there does not appear to be the evidence of infection at this time which is great news. 09/11/18 evaluation today patient presents for follow-up concerning her great toe ulcer on the left which has unfortunately reopened since I last saw her which was only a couple of weeks ago. Unfortunately she was not able to get in to get the shoe and potentially the AFO that's gonna be necessary due to her left foot drop. She continues with offloading shoe but this is not enough to prevent her from reopening it appears. When we last had her in the total contact cast she did well from a healing standpoint but unfortunately the wound reopened as soon as she came out of the cast within just a couple of weeks. 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 This is also part of the reason why she falls on a regular basis. I just do not believe that is gonna be safe for [place] to reinitiate the total contact cast as last time we had this on she fell 3 times one week which is definitely not normal for her. 09/18/18 upon evaluation today the patient actually appears to be doing about the same in regard to her toe ulcer. She did not contact [facility] as I asked her to even though I had given her the prescription. In fact she actually states that she has no idea where the prescription is. She did apparently call [employer] and they told her that all she needed to do was bring the prescription in order to be able to be seen and work on getting the AFO for her left foot. With all that being said she still does not have an appointment and I'm not sure were things stand that regard. I will give her a new prescription today in order  to contact them to get this set up. 09/25/18 on evaluation today patient actually appears to be doing about the same in regard to her toes ulcer. She does have a small areas which seems to have a lot of callous buildup around the edge of the wound which is going to need sharp debridement today. She still is waiting to be scheduled for evaluation with [facility] for possibility of an AFO. She states there supposed to call her tomorrow to get this set up. Unfortunately it does appear that her foot specifically the toe area is showing signs of erythema. There does not appear to be any systemic infection which is in these good news. 10/02/18 on evaluation today patient actually appears to be doing about the same in regard to her toe ulcer. This really has not done too well although it's not significantly larger it's also not significantly smaller. She has been tolerating the dressing changes without complication. She actually has her appointment with [facility] and [place] tomorrow to hopefully be measured for obtaining and AFO splint. I think this would be helpful preventing this from reoccurring. We had contemplated starting the cast this week although to be honest  I am reluctant to do that as she's been having nausea, vomiting, and seizure activity over the past three days. She has a history of seizures and have been told is nothing that can be done for these. With that being said I do believe that along with the seizures have the nausea vomiting which upon further questioning doesn't seem to be the normal for her and makes me concerned for the possibility of infection or something else going on. I discussed this with the patient and her mother during the office visit today. I do not feel the wound is effective but maybe something else. The responses this was "this just happens to her at times and we don't know why". They did not seem to be interested in going to the hospital to have this checked out  further. 10/09/18 on evaluation today patient presents for follow-up concerning her ongoing toe ulcer. She has been tolerating the dressing changes without complication. Fortunately there does not appear to be any evidence of infection which is great news however I do think that the patient would benefit from going ahead for with the total contact cast. She's actually in a wheelchair today she tells me that she will use her walker if we initiate the cast. I was very specific about the fact that if we were gonna do the cast I wanted to make sure that she was using the walker in order to prevent any falls. She tells me she does not have stairs that she has to traverse on a regular basis at her home. She has not had any seizures since last week again that something that happens to her often she tells me she did talk to [employer] and they said that it may take up to three weeks to get the brace approved for her. Hopefully that will not take that long but nonetheless in the meantime I do think the cast could be of benefit. 10/12/18 on evaluation today patient appears to be doing rather well in regard to her toe ulcer. It's just been a few days and already this is significantly improved both as far as overall appearance and size. Fortunately there's no sign of infection. She is here for her first obligatory cast change. KEYLAH, DARWISH A ([id number]) [id number] Page 5 of 19 10/19/18 Seen today for follow up and management of left great toe ulcer. Wound continues to show improvement. Noted small open area with seroussang drainage with palpation. Denies any increased pain or recent fevers during visit. She will continue calcium alginate with offloading shoe. Denies any questions or concerns during visit. 10/26/18 on evaluation today patient appears to be doing about the same as when I last saw her in regard to her wound bed. Fortunately there does not appear to be any signs of  infection. Unfortunately she continues to have a breakdown in regard to the toe region any time that she is not in the cast. It takes almost no time at all for this to happen. Nonetheless she still has not heard anything from the brace being made by [employer] as to when exactly this will be available to her. Fortunately there is no signs of infection at this time. 10/30/18 on evaluation today patient presents for application of the total contact cast as we just received him this morning. Fortunately we are gonna be able to apply this to her today which is great news. She continues to have no significant pain which is good news. Overall I do feel like things  have been improving while she was the cast is when she doesn't have a cast that things get worse. She still has not really heard anything from [place] regarding her brace. 11/02/18 upon evaluation today patient's wound already appears to be doing significantly better which is good news. Fortunately there does not appear to be any signs of infection also good news. Overall I do think the total contact cast as before is helping to heal this area unfortunately it's just not gonna likely keep the area closed and healed without her getting her brace at least. Again the foot drop is a significant issue for her. 11/09/18 on evaluation today patient appears to be doing excellent in regard to her toe ulcer which in fact is completely healed. Fortunately we finally got the situation squared away with the paperwork which was needed to proceed with getting her brace approved by Medicaid. I have filled that out unfortunately that information has been sent to the orthopedic office that I worked at 2 1/2 years ago and not tired Current wound care measures. Fortunately she seems to be doing very well at this time. 11/23/18 on evaluation today patient appears to be doing More Poorly Compared to Last Time I Saw Her. At [facility] She Had Completely Healed. Currently she is  continuing to have issues with reopening. She states that she just found out that the brace was approved through Medicaid now she just has to go get measured in order to have this fitted for her and then made. Subsequently she does not have an appointment for this yet that is going to complicate things we obviously cannot put her back in the cast if we do not have everything measured because they're not gonna be able to measure her foot while she is in the cast. 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 Obviously this is unfortunate and does have me somewhat worried as well. 11/30/18 on evaluation today patient's toe ulcer actually appears to be doing fairly well. The good news is she will be getting her brace in the shoes next week on Wednesday. Hopefully we will be able to get this to heal without having to go back in the cast however she may need the cast in order to get the wound completely heal and then go from there. Fortunately there's no signs of infection at this time. 12/07/18 on evaluation today patient fortunately did receive her brace and she states she could tell this definitely makes her walk better. With that being said she's been having issues with her toe where she noticed yesterday there was a lot of tissue that was loosing off this appears to be much larger than what it was previous. She also states that her leg has been read putting much across the top of her foot just about the ankle although this seems to be receiving somewhat. The total area is still red and appears to be someone infected as best I can tell. She is previously taken Bactrim and that may be a good option for her today as well. We are gonna see what I wound culture shows as well and I think that this is definitely appropriate. With that being said outside of the culture I still need to initiate something in the interim  and that's what I'm gonna go ahead and select Bactrim is a good option for her. 12/14/18 on  evaluation today patient appears to be doing better in regard to her left great toe ulcer as compared to last week's evaluation. There's still some erythema although this is significantly improved which is excellent news. Overall I do believe that she is making good progress is still gonna take some time before she is where I would like her to be from the standpoint of being able to place her back into the total contact cast. Hopefully we will be where we need to be by next week. 12/21/18 on evaluation today patient actually appears to be doing poorly in regard to her toe ulcer. She's been tolerating the dressing changes without complication. Fortunately there's no signs of systemic infection although she does have a lot of drainage from the toe ulcer and this does seem to be causing some issues at this point. She does have erythema on the distal portion of her toe that appears to be likely cellulitis. 12/28/18 on evaluation today patient actually appears to be doing a little better in my pinion in regard to her toe ulcer. With that being said she still does have some evidence of infection at this time and for her culture she had both E. coli as well as enterococcus as organisms noted on evaluation. For that reason I think that though the Keflex likely has treated the E. coli rather well this has really done nothing for the enterococcus. We are going to have to initiate treatment for this specifically. 01/04/19 on evaluation today patient's toe actually appears to be doing better from the standpoint of infection. She currently would like to see about putting the cash back on I think that this is appropriate as long as she takes care of it and keeps it from getting wet. She is gonna have some drainage we can definitely pass this up with Drawtex and alginate to try to prevent as much drainage as possible from causing  the problems. With that being said I do want to at least try her with the cast between now and Tuesday. If there any issues we can't continue to use it then I will discontinue the use of the cast at that point. 01/08/19 on evaluation today patient actually appears to be doing very well as far as her foot ulcer specifically the great toe on the left is concerned. She did have an area of rubbing on the medial aspect of her left ankle which again is from the cast. Fortunately there's no signs of infection at this point in this appears to be a very slight skin breakdown. The patient tells me she felt it rubbing but didn't think it was that bad. Fortunately there is no signs of active infection at this time which is good news. No fevers, chills, nausea, or vomiting noted at this time. 01/15/19 on evaluation today patient actually appears to be doing well in regard to her toe ulcer. Again as previous she seems to do well and she has the cast on which indicates to me that during the time she doesn't have a cast on she's putting way too much pressure on this region. Obviously I think that's gonna be an issue as with the current national emergency concerning the Covid-19 Virus it has been recommended that we discontinue the use of total contact casting by the chief medical officer of our company, Dr. Simona Huh. The reasoning is that if a patient becomes sick and cannot come into have the cast removed they could not just leave this on for an  additional two weeks. Obviously the hospitals also do not want to receive patient's who are sick into the emergency department to potentially contaminate the region and spread the Covid-19 Virus among other sick individuals within the hospital system. Therefore at this point we are suspending the use of total contact cast until the current emergency subsides. This was all discussed with the patient today as well. 01/22/19 on evaluation today patient's wound on her left great toe  appears to be doing slightly worse than previously noted last week. She tells me that she has been on this quite a bit in fact she tells me she's been awake for 38 straight hours. This is due to the fact that she's having to care for grandparents because nobody else will. She has been taking care of them for five the last seven days since I've seen her they both have dementia his is from a stroke and her grandmother's was progressive. Nonetheless she states even her mom who knows her condition and situation has only help two of those days to take care of them she's been taking care of the rest. Fortunately there does not appear to be any signs of active infection in regard to her toe at this point although obviously it doesn't look as good as it did previous. I think this is directly related to her not taking off the pressure and friction by way of taking things easy. Though I completely understand what's going on. 01/29/19 on evaluation today patient's tools are actually appears to be showing some signs of improvement today compared to last week's evaluation as far as not necessarily the overall size of the wound but the fact that she has some new skin growth in between the two ends of the wound opening. Overall I feel like she has done well she states that she had a family member give her what sounds to be a CAM walker boot which has been helpful as well. 02/05/19 on evaluation today patient's wound bed actually appears to be doing significantly better in regard to her overall appearance of the size of the wound. With that being said she is still having an issue with offloading efficiently enough to get this to close. Apparently there is some signs of infection at this point as well unfortunately. Previously she's done well of Augmentin I really do not see anything that needs to be culture currently but there theme and cellulitis of the foot that I'm seeing I'm gonna go ahead and place her on an  antibiotic today to try to help clear this up. 02/12/2019 on evaluation today patient actually appears to be doing poorly in regard to her overall wound status. She tells me she has been using her offloading shoe but actually comes in today wearing her tennis shoe with the AFO brace. Again as I previously discussed with her this is really not sufficient to allow the area to heal appropriately. Nonetheless she continues to be somewhat noncompliant and I do wonder based on what she has told my nurse in the past as to LAKEDRA, [place] A ([id number]) [id number] Page 6 of 19 whether or not a good portion of this noncompliance may be recreational drug and alcohol related. She has had a history of heroin overdose and this was fairly recently in the past couple of months that have been seeing her. Nonetheless overall I feel like her wound looks significantly worse today compared to what it was previous. She still has significant erythema despite the Augmentin I  am not sure that this is an appropriate medication for her infection I am also concerned that the infection may have gone down into her bone. 02/19/19 on evaluation today patient actually appears to be doing about the same in regard to her toe ulcer. Unfortunately she continues to show signs of bone exposure and infection at this point. There does not appear to be any evidence of worsening of the infection but I'm also not really sure that it's getting significantly better. She is on the Augmentin which should be sufficient for the Staphylococcus aureus infection that she has at this point. With that being said she may need IV antibiotics to more appropriately treat this. We did have a discussion today about hyperbaric option therapy. 02/28/19 on evaluation today patient actually appears to be doing much worse in regard to the wound on her left great toe as compared to even my previous evaluation last week. Unfortunately this seems  to be training in a pretty poor direction. Her toe was actually now starting to angle laterally and I can actually see the entire joint area of the proximal portion of the digit where is the distal portion of the digit again is no longer even in contact with the joint line. Unfortunately there's a lot more necrotic tissue around the edge and the toe appears to be showing signs of becoming gangrenous in my pinion. I'm very concerned about were things stand at this point. She did see infectious disease and they are planning to send in a prescription for Sivextro for her and apparently this has been approved. With that being said I don't think she should avoid taking this but at the same time I'm not sure that it's gonna be sufficient to save her toe at this point. 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 03/05/19 on evaluation today patient actually appears to be doing quite well in regard to her toe all things considering. She still has bone exposed but there appears to be much less your thing on overall the appearance of the wound and the toe itself is dramatically improved. She still does have some issues currently obviously with infection she did see vascular as well and there concerned that her blood flow to the toad. For that reason they are setting up for an angiogram next week. 03/14/19 on evaluation today patient appears to be doing very poor in regard to her toe and specifically in regard to the ulceration and the fact that she's starting to notice the toe was leaning even more towards the lateral aspect and the complete joint is visible on the proximal aspect of the joint. Nonetheless she's also noted a significant odor and the tip of the toe is turning more dark and necrotic appearing.  Overall I think she is getting worse not better as far as this is concerned. For that reason I am recommending at this point that she likely needs to be seen for likely amputation. READMISSION 03/19/2021 This is a patient that we cared for in this clinic for a prolonged period of time in 2019 and 2020 with a left foot and left first toe wound. 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 Luana Shu. In December 2021 she had an ulcer on her right great toe as well as the fourth and fifth toes. 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 She also had an angiogram at that time and underwent angioplasty of the right anterior tibial artery. In any case she claims that the wound on the right foot is closed I did not look at this today which was probably an oversight although I think that should be done next week. After her surgery she developed a dehiscence but I do not see any follow-up of this. According to Dr. Deborra Medina last review that she was out of the area being cared for by another physician but recently came back to his attention. 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 A culture of this area showed E. coli apparently before she came back to see Dr. Luana Shu she was supposed to be receiving antibiotics but she did not really take them. Nor is she offloading this area at all. Finally her last hemoglobin A1c listed in epic was in March 2022 at 14.1 she says things are a lot better since then although I am not sure. 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 She had this wound at the time. During this admission she had E. coli sepsis a TEE was negative. Past medical history is extensive and includes type 2 diabetes with peripheral neuropathy cardiomyopathy with  an ejection fraction of 33%, hypertension, hyperlipidemia chronic renal failure stage III history of substance abuse with cocaine although she claims to be clean now verified by her mother. She is still a heavy cigarette smoker. She has a history of bipolar disorder seizure disorder ABI in our clinic was 1.05 6/1; left midfoot in the setting of a TMA done previously. Round circular wound with a "knuckle" of protruding tissue. The problem is that the knuckle was not attached to any of the surrounding granulation and this probed proximally widely I removed a large portion of this tissue. This wound goes with considerable undermining laterally. I do not feel any bone there was no purulence but this is a deep wound. 6/8; in spite of the debridement I did last week. She arrives with a wound looking exactly the same. A protruding "knuckle" of tissue nonadherent to most of the surrounding tissue. There is considerable depth around this from 6-12 o'clock at 2.7 cm and undermining of 1 cm. This does not look overtly infected and the x- ray I did last week was negative for any osseous abnormalities. We have been using silver collagen 6/15; deep tissue culture I did last week showed moderate staph aureus and moderate Pseudomonas. This will definitely require prolonged antibiotic therapy. The pathology on the protuberant area was negative for malignancy fungus etc. 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 We have been using silver collagen. I am going to be prescribing Levaquin for 2 weeks. Her CT scan of the foot is down for 7/5 6/22; CT scan of the foot on 7 5. She says she has hardware in the left leg from her previous fracture. She is on the Levaquin for the deep tissue culture I did that showed methicillin sensitive staph aureus and Pseudomonas. I gave her a 2-week supply and she will have another week. She arrives in clinic today with the same  protuberant tissue however this is  nonadherent to the tissue surrounding it. I am really at a loss to explain this unless there is underlying deep tissue infection 6/29; patient presents for 1 week follow-up. She has been using collagen to the wound bed. She reports taking her antibiotics as prescribed.She has no complaints or issues today. She denies signs of infection. 7/6; patient presents for one week followup. She has been using collagen to the wound bed. She states she is taking Levaquin however at times she is not able to keep it down. She denies signs of infection. 7/13; patient presents for 1 week follow-up. She has been using silver alginate to the wound bed. She still has nausea when taking her antibiotics. She denies signs of infection. 7/20; patient presents for 1 week follow-up. She has been using silver alginate with gentamicin cream to the wound bed. She denies any issues and has no complaints today. She denies signs of infection. 7/27; patient presents for 1 week follow-up. She continues to use silver alginate with gentamicin cream to the wound bed. She reports starting her antibiotics. She has no issues or complaints. Overall she reports stability to the wound. 8/3; patient presents for 1 week follow-up. She has been using silver alginate with gentamicin cream to the wound bed. She reports completing all antibiotics. She has no issues or complaints today. She denies signs of infection. 8/17; patient presents for 2-week follow-up. He is to use silver alginate to the wound bed. She has no issues or complaints today. She denies signs of infection. She reports her pain has improved in her foot since last clinic visit 8/24; patient presents for 1 week follow-up. She continues to use silver alginate to the wound bed. She has no issues or complaints. She denies signs of infection. Pain is stable. 9/7; patient presents for follow-up. She missed her last week appointment due to feeling  ill. She continues to use silver alginate. She has a new wound to the right lower extremity that is covered in eschar. She states It occurred over the past week and has no idea how it started. She currently denies signs of infection. 9/14; patient presents for follow-up. T the left foot wound she has been using gentamicin cream and silver alginate. T the right lower extremity wound she has o o been keeping this covered and has not obtain Santyl. 9/21; patient presents for follow-up. She reports using gentamicin cream and silver alginate to the left foot and Santyl to the right lower extremity wound. She has no issues or complaints today. She denies signs of infection. 9/28; patient presents for follow-up. She reports a new wound to her right heel. She states this occurred a few days ago and is progressively gotten worse. She has been trying to clean the area with a Q-tip and Santyl. She reports stability in the other 2 wounds. She has been using gentamicin cream and silver alginate to the left foot and Santyl to the right lower extremity wound. JUDY, POLLMAN A ([id number]) [id number] Page 7 of 19 10/12; patient presents for follow-up. She reports improvement to the wound beds. She is seeing vein and vascular to discuss the potential of a left BKA. She states they are going to do an arteriogram. She continues to use silver alginate with dressing changes to her wounds. 11/2; patient presents for follow-up. She states she has not been doing dressing changes to the wound beds. She states she is not able to offload the areas. She reports chronic pain to her left foot wound.  11/9; patient presents for follow-up. She came in with only socks on. She states she forgot to put on shoes. It is unclear if she is doing any dressing changes. She currently denies systemic signs of infection. 11/16; patient presents for follow-up. She came again only with socks on. She states she does  not wear shoes ever. It is unclear if she does dressing changes. She currently denies systemic signs of infection. 11/23; patient presents for follow-up. She wore her shoes today. It still unclear exactly what dressing she is using for each wound but she did states she obtained Dakin's solution and has been using this to the left foot wound. She currently denies signs of infection. 11/30; patient presents for follow-up. She has no issues or complaints today. She currently denies signs of infection. 12/7; patient presents for follow-up. She has no issues or complaints today. She has been using Hydrofera Blue to the right heel wound and Dakin solution to the left foot wound. Her right anterior leg wound is healed. She currently denies signs of infection. 12/14; patient presents for follow-up. She has been using Hydrofera Blue to the right heel and Dakin's to the left foot wounds. She has no issues or complaints today. She denies signs of infection. 12/21; patient presents for follow-up. She reports using Hydrofera Blue to the right heel and Dakin's to the left foot wound. She denies signs of infection. 12/28; patient presents for follow-up. She continues to use Dakin's to the left foot wound and Hydrofera Blue to the right heel wound. She denies signs of infection. 1/4; patient presents for follow-up. She has no issues or complaints today. She denies signs of infection. 1/11; patient presents for follow-up. It is unclear if she has been dressing these wounds over the past week. She currently denies signs of infection. 1/18; patient presents for follow-up. She states she has been using Dakin's wet-to-dry dressings to the left foot. She has been using Hydrofera Blue to the right foot foot wound. She states that the anterior right leg wound has reopened and draining serous fluid. She denies signs of infection. 1/25; patient presents for follow-up. She has no issues or complaints today. 2/1; patient  presents for follow-up. She has no issues or complaints today. She denies signs of infection. 2/8; patient presents for follow-up. She has lost her surgical shoes. She did not have a dressing to the right heel wound. She currently denies signs of infection. 2/15; patient presents for follow-up. She reports more pain to the right heel today. She denies purulent drainage Or fever/chills 2/22; patient presents for follow-up. She reports taking clindamycin over the past week. She states that she continues to have pain to her right heel. She reports purulent drainage. Readmission 03/16/2022 Ms. Aiysha Freiwald is a 47 year old female with a past medical history of type 2 diabetes, osteomyelitis to her feet, chronic systolic heart failure and bipolar disorder that presents to the clinic for bilateral feet wounds and right lower extremity wound. She was last seen in our clinic on 12/15/2021. At that time she had purulent drainage coming out of her right plantar foot and I recommended she go to the ED. She states she went to [facility] and has been there for the past 3 months. I cannot see the records. She states she had OR debridement and was on several weeks of IV antibiotics while inpatient. Since discharge she has not been taking care of the wound beds. She had nothing on her feet other than socks  today. She currently denies signs of infection. 5/31; patient presents for follow-up. She has been using Dakin's wet-to-dry dressings to the wound beds on her feet bilaterally and antibiotic ointment to the right anterior leg wound. She had a wound culture done at last clinic visit that showed moderate Pseudomonas aeruginosa sensitive to ciprofloxacin. She currently denies systemic signs of infection. 6/14; patient presents for follow-up. She received Keystone 5 days ago and has been using this on the wound beds. She states that last week she had to go to the hospital because she had increased warmth  and erythema to the right foot. She was started on 2 oral antibiotics. She states she has been taking these. She currently denies systemic signs of infection. She has no issues or complaints today. 6/21; patient presents for follow-up. She states she has been using Keystone antibiotics to the wound beds. She has no issues or complaints today. She denies signs of infection. 6/28; patient presents for follow-up. She has been using Keystone antibiotics to the wound beds. She has no issues or complaints today. 7/12; patient presents for follow-up. Has been using Keystone antibiotics to the wound beds with calcium alginate. She has no issues or complaints today. She never followed up with her orthopedic surgeon who did the OR debridement to the right foot. We discussed the total contact cast for the left foot and patient would like to do this next week. 7/19; patient presents for follow-up. She has been using Keystone antibiotics with calcium alginate to the wound beds. She has no issues or complaints today. Patient is in agreement to do the total contact cast of the left foot today. She knows to return later this week for the obligatory cast change. 05-13-2022 upon evaluation today patient's wound which she has the cast of the left leg actually appears to be doing significantly better. Fortunately I do not see any signs of active infection locally or systemically which is great news and overall I am extremely pleased with where we stand currently. 7/26; patient presents for follow-up. She has a cast in place for the past week. She states it irritated her shin. Other than that she tolerated the cast well. She states she would like a break for 1 week from the cast. We have been using Keystone antibiotic and Aquacel to both wound beds. She denies signs of infection. 8/2; patient presents for follow-up. She has been using Keystone and Aquacel to the wound beds. She denies any issues and has no complaints. She  is agreeable to have the cast placed today for the left leg. 06-03-2022 upon evaluation today patient appears to be doing well with regard to her wound she saw some signs of improvement which is great news. Fortunately I do not see any evidence of active infection locally or systemically at this time which is great news. No fevers, chills, nausea, vomiting, or diarrhea. 8/16; patient presents for follow-up. She has no issues or complaints today. We have been using Keystone and Aquacel to the wound beds. The left lower extremity is in a total contact cast. She is tolerated this well. 8/23; patient presents for follow-up. She has had the total contact cast on the left leg for the past week. Unfortunately this has rubbed and broken down the Bixler, Corryn A ([id number]) [id number] Page 8 of 19 skin to the medial foot. She currently denies signs of infection. She has been using Keystone antibiotic to the right plantar foot wound. 8/30; patient presents for follow-up. We have  held off on the total contact cast for the left leg for the past week. Her wound on the left foot has improved and the previous surrounding breakdown of skin has epithelialized. She has been using Keystone antibiotic to both wound beds. She has no issues or complaints today. She denies signs of infection. 9/6; patient presents for follow-up. She has ordered her's Keystone antibiotic and this is arriving this week. She has been doing Dakin's wet-to-dry dressings to the wound beds. She denies signs of infection. She is agreeable to the total contact cast today. 9/13; patient presents for follow-up. She states that the cast caused her left leg shin to be sore. She would like to take a break from the cast this week. She has been using Keystone antibiotic to the right plantar foot wound. She denies signs of infection. 9/20; patient presents for follow-up. She has been using Keystone antibiotics to the wound  beds with calcium alginate to the right foot wound and Hydrofera Blue to the left foot wound. She is agreeable to having the cast placed today. She has been approved for Apligraf and we will order this for next clinic visit. 9/27; patient presents for follow-up. We have been using Keystone antibiotic with Hydrofera Blue to the left foot wound under a total contact cast. T the right o foot wound she has been using Keystone antibiotic and calcium alginate. She declines a total contact cast today. Apligraf is available for placement and she would like to proceed with this. 07-28-2022 upon evaluation today patient appears to be doing well currently in regard to her wound. She is actually showing signs of significant improvement which is great news. Fortunately I do not see any evidence of active infection locally nor systemically at this time. She has been seeing Dr. Heber Sobieski and to be honest has been doing very well with the cast. Subsequently she comes in today with a cast on and we did reapply that today as well. She did not really want to she try to talk me out of that but I explained that if she wanted to heal this is really the right way to go. Patient voiced understanding. In regard to her right foot this is actually a lot better compared to the last time I saw her which is also great news. 10/11; patient presents for follow-up. Apligraf and the total contact cast was placed to the left leg at last clinic visit. She states that her right foot wound had burning pain to it with the placement of Apligraf to this area. She has been doing [place] over this area. She denies signs of infection including increased warmth, erythema or purulent drainage. 11/1; 3-week follow-up. The patient fortunately did not have a total contact cast or an Apligraf and on the left foot. She has been using Keystone ABD pads and kerlix and her own running shoes She arrives in clinic today with thick callus and a very poor  surface on the left foot on the right nonviable skin subcutaneous tissue and a deep probing hole. 11/15; patient missed her last clinic appointment. She states she has not been dressing the wound beds for the past 2 weeks. She states that at she had a new roommate but is now going back to live with her mother. Apparently its been a distracting 2 weeks. Patient currently denies signs of infection. 11/22; patient presents for follow-up. She states she has been using Keystone antibiotic and Dakin's wet-to-dry dressings to the wound beds. She is agreeable  for cast placement today. We had ordered Apligraf however this has not been received by our facility. 11/29; Patient had a total contact cast placed at last clinic visit and she tolerated this well. We were using silver alginate under the cast. Patient's been using Keystone antibiotic with Aquacel to the right plantar foot wound. She has no issues or complaints today. Apligraf is available for placement today. Patient would like to proceed with this. 12/6; patient presents for follow-up. She had Apligraf placed in standard fashion last clinic visit under the total contact cast to the left lower extremity. She has been using Keystone antibiotic and Aquacel to the right plantar foot wound. She has no issues or complaints today. 12/13; patient presents for follow-up. She has finished 5 Apligraf placements. Was told she would not qualify for more. We have been doing a total contact cast to the left lower extremity. She has been using Keystone antibiotic and Aquacel to the right plantar foot wound. She has no issues or complaints today. 12/20; patient presents for follow-up. We have been using Hydrofera Blue with Keystone antibiotic under a total contact cast of the left lower extremity. She reports using Keystone antibiotic and silver alginate to the right heel wound. She has no issues or complaints today. 12/27; patient presents with a healthy wound on the  left midfoot. We have Apligraf to apply that to that more also using a total contact cast. On the right we are using Keystone and silver alginate. She is offloading the right heel with a surgical shoealthough by her admission she is on her feet quite a bit 1/30; patient presents for follow-up. Apligraf was placed to the wound beds last clinic visit. She was placed in a total contact cast to the left lower extremity. She declines a total contact cast today. She states that her mother is in the hospital and she cannot adequately get around with the cast on. Electronic Signature(s) Signed: 10/26/2022 1:46:18 PM By: Kalman Shan DO Entered By: Kalman Shan on 10/26/2022 12:06:20 -------------------------------------------------------------------------------- Physical Exam Details Patient Name: Date of Service: Heather Hitch A. 10/26/2022 11:00 A M Medical Record Number: [id number] Patient Account Number: [id number] Date of Birth/Sex: Treating RN: 09-17-1976 (47 y.o. [name] Primary Care Provider: Raelene Bott Other Clinician: Massie Kluver Referring Provider: Treating Provider/Extender: Eddie North in Treatment: [address], [place] ([id number]) [id number] Page 9 of 19 Constitutional . Cardiovascular . Psychiatric . Notes Right foot: T the plantar heel there is an incision site with increased depth. T the opening there is Nonviable tissue and granulation tissue. Left foot: T the o o o medial aspect there is an open wound with granulation tissue and nonviable tissue with surrounding callus. No overt signs of infection to any of the wound beds. Electronic Signature(s) Signed: 10/26/2022 1:46:18 PM By: Kalman Shan DO Entered By: Kalman Shan on 10/26/2022 12:07:33 -------------------------------------------------------------------------------- Physician Orders Details Patient Name: Date of Service: Heather Hitch  A. 10/26/2022 11:00 A M Medical Record Number: [id number] Patient Account Number: [id number] Date of Birth/Sex: Treating RN: 09-02-1976 (47 y.o. [name] Primary Care Provider: Raelene Bott Other Clinician: Massie Kluver Referring Provider: Treating Provider/Extender: Eddie North in Treatment: 62 Verbal / Phone Orders: No Diagnosis Coding Follow-up Appointments Return Appointment in 1 week. Nurse Visit as needed Bathing/ Shower/ Hygiene May shower with wound dressing protected with water repellent cover or cast protector. No tub bath. Anesthetic (Use 'Patient Medications' Section for Anesthetic Order Entry) Lidocaine  applied to wound bed Cellular or Tissue Based Products Cellular or Tissue Based Product Type: - Apligraf # 1 Edema Control - Lymphedema / Segmental Compressive Device / Other Elevate, Exercise Daily and A void Standing for Long Periods of Time. Elevate legs to the level of the heart and pump ankles as often as possible Elevate leg(s) parallel to the floor when sitting. Off-Loading Total Contact Cast to Left Lower Extremity Open toe surgical shoe - Heel off loader - Right foot Other: - keep pressure off of feet. Medications-Please add to medication list. Wound #12 Left,Medial,Plantar Foot Keystone Compound - Keystone bilat feet Wound Treatment Wound #11 - Foot Wound Laterality: Plantar, Right Cleanser: Dakin 16 (oz) 0.25 1 x Per Day/30 Days Discharge Instructions: Use as directed. SASKIA, SIMERSON A ([id number]) [id number] Page 10 of 19 Cleanser: Soap and Water 1 x Per Day/30 Days Discharge Instructions: Gently cleanse wound with antibacterial soap, rinse and pat dry prior to dressing wounds Topical: keystone gel 1 x Per Day/30 Days Prim Dressing: Aquacel Extra Hydrofiber Dressing, 2x2 (in/in) ary 1 x Per Day/30 Days Secondary Dressing: ABD Pad 5x9 (in/in) (Generic) 1 x Per Day/30 Days Discharge  Instructions: Cover with ABD pad Secured With: Medipore T - 72M Medipore H Soft Cloth Surgical T ape ape, 2x2 (in/yd) (Generic) 1 x Per Day/30 Days Secured With: Kerlix Roll Sterile or Non-Sterile 6-ply 4.5x4 (yd/yd) (Generic) 1 x Per Day/30 Days Discharge Instructions: Apply Kerlix as directed Wound #12 - Foot Wound Laterality: Plantar, Left, Medial Cleanser: Dakin 16 (oz) 0.25 (Dispense As Written) 1 x Per Day/30 Days Discharge Instructions: Use as directed. Topical: Vitamin AandD Ointment, 4(oz) tube 1 x Per Day/30 Days Discharge Instructions: apply to red, broken down area Prim Dressing: Hydrofera Blue Ready Transfer Foam, 2.5x2.5 (in/in) 1 x Per Day/30 Days ary Discharge Instructions: Apply Hydrofera Blue Ready to wound bed as directed Prim Dressing: Keystone gel ary 1 x Per Day/30 Days Secondary Dressing: ABD Pad 5x9 (in/in) (Generic) 1 x Per Day/30 Days Discharge Instructions: Cover with ABD pad Secured With: Medipore T - 72M Medipore H Soft Cloth Surgical T ape ape, 2x2 (in/yd) (Generic) 1 x Per Day/30 Days Secured With: Kerlix Roll Sterile or Non-Sterile 6-ply 4.5x4 (yd/yd) (Generic) 1 x Per Day/30 Days Discharge Instructions: Apply Kerlix as directed Electronic Signature(s) Signed: 10/26/2022 1:46:18 PM By: Kalman Shan DO Entered By: Kalman Shan on 10/26/2022 12:11:12 -------------------------------------------------------------------------------- Problem List Details Patient Name: Date of Service: Heather Hitch A. 10/26/2022 11:00 A M Medical Record Number: [id number] Patient Account Number: [id number] Date of Birth/Sex: Treating RN: Mar 02, 1976 (47 y.o. [name] Primary Care Provider: Raelene Bott Other Clinician: Massie Kluver Referring Provider: Treating Provider/Extender: Eddie North in Treatment: 32 Active Problems ICD-10 Encounter Code Description Active Date MDM Diagnosis L97.528 Non-pressure chronic ulcer of other part  of left foot with other specified 03/16/2022 No Yes severity Lenhard, Mandie A ([id number]) [id number] Page 11 of 19 L97.512 Non-pressure chronic ulcer of other part of right foot with fat layer exposed 03/16/2022 No Yes E11.621 Type 2 diabetes mellitus with foot ulcer 03/16/2022 No Yes E11.42 Type 2 diabetes mellitus with diabetic polyneuropathy 03/16/2022 No Yes L97.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 03/16/2022 No Yes of skin Inactive Problems Resolved Problems Electronic Signature(s) Signed: 10/26/2022 1:46:18 PM By: Kalman Shan DO Entered By: Kalman Shan on 10/26/2022 12:05:21 -------------------------------------------------------------------------------- Progress Note Details Patient Name: Date of Service: [name], [name] 10/26/2022 11:00 A M  Medical Record Number: [id number] Patient Account Number: [id number] Date of Birth/Sex: Treating RN: December 27, 1975 (47 y.o. [name] Primary Care Provider: Raelene Bott Other Clinician: Massie Kluver Referring Provider: Treating Provider/Extender: Eddie North in Treatment: 41 Subjective Chief Complaint Information obtained from Patient 03/19/2021; patient referred by Dr. Luana Shu who has been looking after her left foot for quite a period of time for review of a nonhealing area in the left midfoot 03/12/2022; bilateral feet wounds and right lower extremity wound. History of Present Illness (HPI) 01/18/18-She is here for initial evaluation of the left great toe ulcer. She is a poor historian in regards to timeframe in detail. She states approximately 4 weeks ago she lacerated her toe on something in the house. She followed up with her primary care who placed her on Bactrim and ultimately a second dose of Bactrim prior to coming to wound clinic. She states she has been treating the toe with peroxide, Betadine and a Band-Aid. She did not  check her blood sugar this morning but checked it yesterday morning it was 327; she is unaware of a recent A1c and there are no current records. She saw Dr. she would've orthopedics last week for an old injury to the left ankle, she states he did not see her toe, nor did she bring it to his attention. She smokes approximately 1 pack cigarettes a day. Her social situation is concerning, she arrives this morning with her mother who appears extremely intoxicated/under the influence; her mother was asked to leave the room and be monitored by the patient's grandmother. The patient's aunt then accompanied the patient and the room throughout the rest of the appointment. We had a lengthy discussion regarding the deleterious effects of uncontrolled hyperglycemia and smoking as it relates to wound healing and overall health. She was strongly encouraged to decrease her smoking and get her diabetes under better control. She states she is currently on a diet and has cut down her [facility] consumption. The left toe is erythematous, macerated and slightly edematous with malodor present. The edema in her left foot is below her baseline, there is no erythema streaking. We will treat her with Santyl, doxycycline; we have ordered and xray, culture and provided a Peg assist surgical shoe and cultured the wound. 01/25/18-She is here in follow-up evaluation for a left great toe ulcer and presents with an abscess to her suprapubic area. She states her blood sugars remain elevated, feeling "sick" and if levels are below 250, but she is trying. She has made no attempt to decrease her smoking stating that we "can't take away her food in her cigarettes". She has been compliant with offloading using the PEG assist you. She is using Santyl daily. the culture obtained last week grew staph aureus and Enterococcus faecalis; continues on the doxycycline and Augmentin was added on Monday. The suprapubic area has erythema, no  femoral variation, purple discoloration, minimal induration, was accessed with a cotton tip applicator with sanguinopurulent drainage, this was cultured, I suspect the current antibiotic treatment will cover and we will not add anything to her current treatment plan. She was advised to go to urgent care or ER with any change in redness, induration or fever. 02/01/18-She is here in follow-up evaluation for left great toe ulcers and a new abdominal abscess from last week. She was able to use packing until earlier this week, where she "forgot it was there". She states she was feeling ill with GI symptoms last week and  was not taking her antibiotic. She states her glucose levels have been predominantly less than 200, with occasional levels between 200-250. She thinks this was contributing to her GI symptoms as they have SHAKEELA, RABADAN A ([id number]) [id number] Page 12 of 19 resolved without intervention. There continues to be significant laceration to left toe, otherwise it clinically looks stable/improved. There is now less superficial opening to the lateral aspect of the great toe that was residual blister. We will transition to [facility] to all wounds, she will continue her Augmentin. If there is no change or deterioration next week for reculture. 02/08/18-She is here in follow-up evaluation for left great toe ulcer and abdominal ulcer. There is an improvement in both wounds. She has been wrapping her left toe with coban, not by our direction, which has created an area of discoloration to the medial aspect; she has been advised to NOT use coban secondary to her neuropathy. She states her glucose levels have been high over this last week ranging from 200-350, she continues to smoke. She admits to being less compliant with her offloading shoe. We will continue with same treatment plan and she will follow-up next week. 02/15/18-She is here in follow-up evaluation for left  great toe ulcer and abdominal ulcer. The abdominal ulcer is epithelialized. The left great toe ulcer is improved and all injury from last week using the Coban wrap is resolved, the lateral ulcer is healed. She admits to noncompliance with wearing offloading shoe and admits to glucose levels being greater than 300 most of the week. She continues to smoke and expresses no desire to quit. There is one area medially that probes deeper than it has historically, erythema to the toe and dorsal foot has consistently waxed and waned. There is no overt signs of cellulitis or infection but we will culture the wound for any occult infection given the new area of depth and erythema. We will hold off on sensitivities for initiation of antibiotic therapy. 02/22/18-She is here in follow up evaluation for left great toe ulcer. There is overall significant improvement in both wound appearance, erythema and edema with changes made last week. She was not initiated on antibiotic therapy. Culture obtained last week showed oxacillin sensitive staph aureus, sensitive to clindamycin. Clindamycin has been called into the pharmacy but she has been instructed to hold off on initiation secondary to overall clinical improvement and her history of antibiotic intolerance. She has been instructed to contact the clinic with any noted changes/deterioration and the wound, erythema, edema and/or pain. She will follow-up next week. She continues to smoke and her glucose levels remain elevated >250; she admits to compliance with offloading shoe 03/01/18 on evaluation today patient appears to be doing fairly well in regard to her left first toe ulcer. She has been tolerating the dressing changes with the [facility] Dressing without complication and overall this has definitely showed signs of improvement according to records as well is what the patient tells me today. I'm very pleased in that regard. She is having no pain today 03/08/18 She is  here for follow up evaluation of a left great toe ulcer. She remains non-compliant with glucose control and smoking cessation; glucose levels consistently >200. She states that she got new shoe inserts/peg assist. She admits to compliance with offloading. Since my last evaluation there is significant improvement. We will switch to prisma at this time and she will follow up next week. She is noted to be tachycardic at this appointment, heart rate 120s;  she has a history of heart rate 70-130 according to our records. She admits to extreme agitation r/t personal issues; she was advised to monitor her heartrate and contact her physician if it does not return to a more normal range (<100). She takes cardizem twice daily. 03/15/18-She is here in follow-up evaluation for left great toe ulcer. She remains noncompliant with glucose control and smoking cessation. She admits to compliance with wearing offloading shoe. The ulcer is improved/stable and we will continue with the same treatment plan and she will follow-up next week 03/22/18-She is here for evaluation for left great toe ulcer. There continues to be significant improvement despite recurrent hyperglycemia (over 500 yesterday) and she continues to smoke. She has been compliant with offloading and we will continue with same treatment plan and she will follow-up next week. 03/29/18-She is here for evaluation for left great toe ulcer. Despite continuing to smoke and uncontrolled diabetes she continues to improve. She is compliant with offloading shoe. We will continue with the same treatment plan and she will follow-up next week 04/05/18- She is here in follow up evaluation for a left great toe ulcer; she presents with small pustule to left fifth toe (resembles ant bite). She admits to compliance with wearing offloading shoe; continues to smoke or have uncontrolled blood glucose control. There is more callus than usual with evidence of bleeding; she denies known  trauma. 04/12/18-She is here for evaluation of left great toe ulcer. Despite noncompliance with glycemic control and smoking she continues to make improvement. She continues to wear offloading shoe. The pustule, that was identified last week, to the left fifth toe is resolved. She will follow-up in 2 weeks 05/03/18-she is seen in follow-up evaluation for a left great toe ulcer. She is compliant with offloading, otherwise noncompliant with glycemic control and smoking. She has plateaued and there is minimal improvement noted. We will transition to [facility], replaced the insert to her surgical shoe and she will follow-up in one week 05/10/18- She is here in follow up evaluation for a left great toe ulcer. It appears stable despite measurement change. We will continue with same treatment plan and follow up next week. 05/24/18-She is seen in follow-up evaluation for a left great toe ulcer. She remains compliant with offloading, has made significant improvement in her diet, decreasing the amount of sugar/soda. She said her recent A1c was 10.9 which is lower than. She did see a diabetic nutritionist/educator yesterday. She continues to smoke. We will continue with the same treatment plan and she'll follow-up next week. 05/31/18- She is seen in follow-up evaluation for left great toe ulcer. She continues to remain compliant with offloading, continues to make improvement in her diet, increasing her water and decreasing the amount of sugar/soda. She does continue to smoke with no desire to quit. We will apply Prisma to the depth and Hydrofera Blue over. We have not received insurance authorization for oasis. She will follow up next week. 06/07/18-She is seen in follow-up evaluation for left great toe ulcer. It has stalled according to today's measurements although base appears stable. She says she saw a diabetic educator yesterday; her average blood sugars are less than 300 which is an improvement for her. She  continues to smoke and states "that's my next step" She continues with water over soda. We will order for xray, culture and reinstate ace wrap compression prior to placing apligraf for next week. She is voicing no complaints or concerns. Her dressing will change to iodoflex over  the next week in preparation for apligraf. 06/14/18-She is seen in follow-up evaluation for left great toe ulcer. Plain film x-ray performed last week was negative for osteomyelitis. Wound culture obtained last week grew strep B and OSSA; she is initiated on keflex and cefdinir today; there is erythema to the toe which could be from ace wrap compression, she has a history of wrapping too tight and has has been encouraged to maintain ace wraps that we place today. We will hold off on application of apligraf today, will apply next week after antibiotic therapy has been initiated. She admits today that she has resumed taking a shower with her foot/toe submerged in water, she has been reminded to keep foot/toe out of the bath water. She will be seen in follow up next week 06/21/18-she is seen in follow-up evaluation for left great toe ulcer. She is tolerating antibiotic therapy with no GI disturbance. The wound is stable. Apligraf was applied today. She has been decreasing her smoking, only had 4 cigarettes yesterday and 1 today. She continues being more compliant in diabetic diet. She will follow-up next week for evaluation of site, if stable will remove at 2 weeks. 06/28/18- She is here in follow up evalution. Apligraf was placed last week, she states the dressing fell off on Tuesday and she was dressing with hydrofera blue. She is healed and will be discharged from the clinic today. She has been instructed to continue with smoking cessation, continue monitoring glucose levels, offloading for an additional 4 weeks and continue with hydrofera blue for additional two weeks for any possible microscopic opening. Readmission: 08/07/18 on  evaluation today patient presents for reevaluation concerning the ulcer of her right great toe. She was previously discharged on 06/28/18 healed. Nonetheless she states that this began to show signs of drainage she subsequently went to her primary care provider. Subsequently an x-ray was performed on 08/01/18 which was negative. The patient was also placed on antibiotics at that time. Fortunately they should have been effective for the infection. Nonetheless she's been experiencing some improvement but still has a lot of drainage coming from the wound itself. 08/14/18 on evaluation today patient's wound actually does show signs of improvement in regard to the erythema at this point. She has completed the antibiotics. With that being said we did discuss the possibility of placing her in a total contact cast as of today although I think that I may want to give this just a little bit more time to ensure nothing recurrence as far as her infection is concerned. I do not want to put in the cast and risk infection at that time if things are not completely resolved. With that being said she is gonna require some debridement today. 08/21/18 on evaluation today patient actually appears to be doing okay in regard to her toe ulcer. She's been tolerating the dressing changes without complication. With that being said it does appear that she is ready and in fact I think it's appropriate for [place] to go ahead and initiate the total contact cast today. Nonetheless she will require some sharp debridement to prepare the wound for application. Overall I feel like things have been progressing well but we do need to do something to get this to close more readily. 08/24/18 patient seen today for reevaluation after having had the total contact cast applied on Tuesday. She seems to have done very well the wound appears to be doing great and overall I'm pleased with the progress that she's made.  There were no abnormal areas of rubbing  from the cast on her lower extremity. 08/30/18 on evaluation today patient actually appears to be completely healed in regard to her plantar toe ulcer. She tells me at this point she's been having a lot of issues with the cast. She almost fell a couple of times the state shall the step of her dog a couple times as well. This is been a very frustrating process for her other nonetheless she has completely healed the wound which is excellent news. Overall there does not appear to be the evidence of infection at this time which is great news. 09/11/18 evaluation today patient presents for follow-up concerning her great toe ulcer on the left which has unfortunately reopened since I last saw her which AVANTI, JETTER A ([id number]) [id number] Page 13 of 19 was only a couple of weeks ago. Unfortunately she was not able to get in to get the shoe and potentially the AFO that's gonna be necessary due to her left foot drop. She continues with offloading shoe but this is not enough to prevent her from reopening it appears. When we last had her in the total contact cast she did well from a healing standpoint but unfortunately the wound reopened as soon as she came out of the cast within just a couple of weeks. 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 This is also part of the reason why she falls on a regular basis. I just do not believe that is gonna be safe for [place] to reinitiate the total contact cast as last time we had this on she fell 3 times one week which is definitely not normal for her. 09/18/18 upon evaluation today the patient actually appears to be doing about the same in regard to her toe ulcer. She did not contact [employer] as I asked her to even though I had given her the  prescription. In fact she actually states that she has no idea where the prescription is. She did apparently call [employer] and they told her that all she needed to do was bring the prescription in order to be able to be seen and work on getting the AFO for her left foot. With all that being said she still does not have an appointment and I'm not sure were things stand that regard. I will give her a new prescription today in order to contact them to get this set up. 09/25/18 on evaluation today patient actually appears to be doing about the same in regard to her toes ulcer. She does have a small areas which seems to have a lot of callous buildup around the edge of the wound which is going to need sharp debridement today. She still is waiting to be scheduled for evaluation with [employer] for possibility of an AFO. She states there supposed to call her tomorrow to get this set up. Unfortunately it does appear that her foot specifically the toe area is showing signs of erythema. There does not appear to be any systemic infection which is in these good news. 10/02/18 on evaluation today patient actually appears to be doing about the same in regard to her toe ulcer. This really has not done too well although it's not significantly larger it's also not significantly smaller.  She has been tolerating the dressing changes without complication. She actually has her appointment with [facility] and [place] tomorrow to hopefully be measured for obtaining and AFO splint. I think this would be helpful preventing this from reoccurring. We had contemplated starting the cast this week although to be honest I am reluctant to do that as she's been having nausea, vomiting, and seizure activity over the past three days. She has a history of seizures and have been told is nothing that can be done for these. With that being said I do believe that along with the seizures have the nausea vomiting which upon further questioning doesn't  seem to be the normal for her and makes me concerned for the possibility of infection or something else going on. I discussed this with the patient and her mother during the office visit today. I do not feel the wound is effective but maybe something else. The responses this was "this just happens to her at times and we don't know why". They did not seem to be interested in going to the hospital to have this checked out further. 10/09/18 on evaluation today patient presents for follow-up concerning her ongoing toe ulcer. She has been tolerating the dressing changes without complication. Fortunately there does not appear to be any evidence of infection which is great news however I do think that the patient would benefit from going ahead for with the total contact cast. She's actually in a wheelchair today she tells me that she will use her walker if we initiate the cast. I was very specific about the fact that if we were gonna do the cast I wanted to make sure that she was using the walker in order to prevent any falls. She tells me she does not have stairs that she has to traverse on a regular basis at her home. She has not had any seizures since last week again that something that happens to her often she tells me she did talk to [employer] and they said that it may take up to three weeks to get the brace approved for her. Hopefully that will not take that long but nonetheless in the meantime I do think the cast could be of benefit. 10/12/18 on evaluation today patient appears to be doing rather well in regard to her toe ulcer. It's just been a few days and already this is significantly improved both as far as overall appearance and size. Fortunately there's no sign of infection. She is here for her first obligatory cast change. 10/19/18 Seen today for follow up and management of left great toe ulcer. Wound continues to show improvement. Noted small open area with seroussang drainage with palpation.  Denies any increased pain or recent fevers during visit. She will continue calcium alginate with offloading shoe. Denies any questions or concerns during visit. 10/26/18 on evaluation today patient appears to be doing about the same as when I last saw her in regard to her wound bed. Fortunately there does not appear to be any signs of infection. Unfortunately she continues to have a breakdown in regard to the toe region any time that she is not in the cast. It takes almost no time at all for this to happen. Nonetheless she still has not heard anything from the brace being made by [employer] as to when exactly this will be available to her. Fortunately there is no signs of infection at this time. 10/30/18 on evaluation today patient presents for application of the total contact  cast as we just received him this morning. Fortunately we are gonna be able to apply this to her today which is great news. She continues to have no significant pain which is good news. Overall I do feel like things have been improving while she was the cast is when she doesn't have a cast that things get worse. She still has not really heard anything from [place] regarding her brace. 11/02/18 upon evaluation today patient's wound already appears to be doing significantly better which is good news. Fortunately there does not appear to be any signs of infection also good news. Overall I do think the total contact cast as before is helping to heal this area unfortunately it's just not gonna likely keep the area closed and healed without her getting her brace at least. Again the foot drop is a significant issue for her. 11/09/18 on evaluation today patient appears to be doing excellent in regard to her toe ulcer which in fact is completely healed. Fortunately we finally got the situation squared away with the paperwork which was needed to proceed with getting her brace approved by Medicaid. I have filled that out unfortunately  that information has been sent to the orthopedic office that I worked at 2 1/2 years ago and not tired Current wound care measures. Fortunately she seems to be doing very well at this time. 11/23/18 on evaluation today patient appears to be doing More Poorly Compared to Last Time I Saw Her. At [facility] She Had Completely Healed. Currently she is continuing to have issues with reopening. She states that she just found out that the brace was approved through Medicaid now she just has to go get measured in order to have this fitted for her and then made. Subsequently she does not have an appointment for this yet that is going to complicate things we obviously cannot put her back in the cast if we do not have everything measured because they're not gonna be able to measure her foot while she is in the cast. 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 Obviously this is unfortunate and does have me somewhat worried as well. 11/30/18 on evaluation today patient's toe ulcer actually appears to be doing fairly well. The good news is she will be getting her brace in the shoes next week on Wednesday. Hopefully we will be able to get this to heal without having to go back in the cast however she may need the cast in order to get the wound completely heal and then go from there. Fortunately there's no signs of infection at this time. 12/07/18 on evaluation today patient fortunately did receive her brace and she states she could tell this definitely makes her walk better. With that being said she's been having issues with her toe where she noticed yesterday there was a lot of tissue that was loosing off this appears to be much larger than what it was previous. She also states that her leg has been read putting much across the top of her foot just about the ankle although this seems to be receiving somewhat. The total  area is still red and appears to be someone infected as best I can tell. She is previously taken Bactrim and that may be a good option for her today as well. We are gonna see what I wound culture shows as well and I  think that this is definitely appropriate. With that being said outside of the culture I still need to initiate something in the interim and that's what I'm gonna go ahead and select Bactrim is a good option for her. 12/14/18 on evaluation today patient appears to be doing better in regard to her left great toe ulcer as compared to last week's evaluation. There's still some erythema although this is significantly improved which is excellent news. Overall I do believe that she is making good progress is still gonna take some time before she is where I would like her to be from the standpoint of being able to place her back into the total contact cast. Hopefully we will be where we need to be by next week. 12/21/18 on evaluation today patient actually appears to be doing poorly in regard to her toe ulcer. She's been tolerating the dressing changes without complication. Fortunately there's no signs of systemic infection although she does have a lot of drainage from the toe ulcer and this does seem to be causing some issues at this point. She does have erythema on the distal portion of her toe that appears to be likely cellulitis. 12/28/18 on evaluation today patient actually appears to be doing a little better in my pinion in regard to her toe ulcer. With that being said she still does have some evidence of infection at this time and for her culture she had both E. coli as well as enterococcus as organisms noted on evaluation. For that reason I think that though the Keflex likely has treated the E. coli rather well this has really done nothing for the enterococcus. We are going to have to initiate treatment for this specifically. PHOUA, HOADLEY A ([id number]) [id number]  Page 14 of 19 01/04/19 on evaluation today patient's toe actually appears to be doing better from the standpoint of infection. She currently would like to see about putting the cash back on I think that this is appropriate as long as she takes care of it and keeps it from getting wet. She is gonna have some drainage we can definitely pass this up with Drawtex and alginate to try to prevent as much drainage as possible from causing the problems. With that being said I do want to at least try her with the cast between now and Tuesday. If there any issues we can't continue to use it then I will discontinue the use of the cast at that point. 01/08/19 on evaluation today patient actually appears to be doing very well as far as her foot ulcer specifically the great toe on the left is concerned. She did have an area of rubbing on the medial aspect of her left ankle which again is from the cast. Fortunately there's no signs of infection at this point in this appears to be a very slight skin breakdown. The patient tells me she felt it rubbing but didn't think it was that bad. Fortunately there is no signs of active infection at this time which is good news. No fevers, chills, nausea, or vomiting noted at this time. 01/15/19 on evaluation today patient actually appears to be doing well in regard to her toe ulcer. Again as previous she seems to do well and she has the cast on which indicates to me that during the time she doesn't have a cast on she's putting way too much pressure on this region. Obviously I think that's gonna be an issue as with the current national emergency concerning the Covid-19  Virus it has been recommended that we discontinue the use of total contact casting by the chief medical officer of our company, Dr. Simona Huh. The reasoning is that if a patient becomes sick and cannot come into have the cast removed they could not just leave this on for an additional two weeks. Obviously the hospitals also  do not want to receive patient's who are sick into the emergency department to potentially contaminate the region and spread the Covid-19 Virus among other sick individuals within the hospital system. Therefore at this point we are suspending the use of total contact cast until the current emergency subsides. This was all discussed with the patient today as well. 01/22/19 on evaluation today patient's wound on her left great toe appears to be doing slightly worse than previously noted last week. She tells me that she has been on this quite a bit in fact she tells me she's been awake for 38 straight hours. This is due to the fact that she's having to care for grandparents because nobody else will. She has been taking care of them for five the last seven days since I've seen her they both have dementia his is from a stroke and her grandmother's was progressive. Nonetheless she states even her mom who knows her condition and situation has only help two of those days to take care of them she's been taking care of the rest. Fortunately there does not appear to be any signs of active infection in regard to her toe at this point although obviously it doesn't look as good as it did previous. I think this is directly related to her not taking off the pressure and friction by way of taking things easy. Though I completely understand what's going on. 01/29/19 on evaluation today patient's tools are actually appears to be showing some signs of improvement today compared to last week's evaluation as far as not necessarily the overall size of the wound but the fact that she has some new skin growth in between the two ends of the wound opening. Overall I feel like she has done well she states that she had a family member give her what sounds to be a CAM walker boot which has been helpful as well. 02/05/19 on evaluation today patient's wound bed actually appears to be doing significantly better in regard to her overall  appearance of the size of the wound. With that being said she is still having an issue with offloading efficiently enough to get this to close. Apparently there is some signs of infection at this point as well unfortunately. Previously she's done well of Augmentin I really do not see anything that needs to be culture currently but there theme and cellulitis of the foot that I'm seeing I'm gonna go ahead and place her on an antibiotic today to try to help clear this up. 02/12/2019 on evaluation today patient actually appears to be doing poorly in regard to her overall wound status. She tells me she has been using her offloading shoe but actually comes in today wearing her tennis shoe with the AFO brace. Again as I previously discussed with her this is really not sufficient to allow the area to heal appropriately. Nonetheless she continues to be somewhat noncompliant and I do wonder based on what she has told my nurse in the past as to whether or not a good portion of this noncompliance may be recreational drug and alcohol related. She has had a history of heroin overdose and  this was fairly recently in the past couple of months that have been seeing her. Nonetheless overall I feel like her wound looks significantly worse today compared to what it was previous. She still has significant erythema despite the Augmentin I am not sure that this is an appropriate medication for her infection I am also concerned that the infection may have gone down into her bone. 02/19/19 on evaluation today patient actually appears to be doing about the same in regard to her toe ulcer. Unfortunately she continues to show signs of bone exposure and infection at this point. There does not appear to be any evidence of worsening of the infection but I'm also not really sure that it's getting significantly better. She is on the Augmentin which should be sufficient for the Staphylococcus aureus infection that she has at this point.  With that being said she may need IV antibiotics to more appropriately treat this. We did have a discussion today about hyperbaric option therapy. 02/28/19 on evaluation today patient actually appears to be doing much worse in regard to the wound on her left great toe as compared to even my previous evaluation last week. Unfortunately this seems to be training in a pretty poor direction. Her toe was actually now starting to angle laterally and I can actually see the entire joint area of the proximal portion of the digit where is the distal portion of the digit again is no longer even in contact with the joint line. Unfortunately there's a lot more necrotic tissue around the edge and the toe appears to be showing signs of becoming gangrenous in my pinion. I'm very concerned about were things stand at this point. She did see infectious disease and they are planning to send in a prescription for Sivextro for her and apparently this has been approved. With that being said I don't think she should avoid taking this but at the same time I'm not sure that it's gonna be sufficient to save her toe at this point. 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 03/05/19 on evaluation today patient actually appears to be doing quite well in regard to her toe all things considering. She still has bone exposed but there appears to be much less your thing on overall the appearance of the wound and the toe itself is dramatically improved. She still does have some issues currently obviously with infection she did see vascular as well and there concerned that her blood flow to the toad. For that reason they are setting up for an angiogram next week. 03/14/19 on evaluation today patient appears to be doing very poor in regard  to her toe and specifically in regard to the ulceration and the fact that she's starting to notice the toe was leaning even more towards the lateral aspect and the complete joint is visible on the proximal aspect of the joint. Nonetheless she's also noted a significant odor and the tip of the toe is turning more dark and necrotic appearing. Overall I think she is getting worse not better as far as this is concerned. For that reason I am recommending at this point that she likely needs to be seen for likely amputation. READMISSION 03/19/2021 This is a patient that we cared for in this clinic for  a prolonged period of time in 2019 and 2020 with a left foot and left first toe wound. 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 Luana Shu. In December 2021 she had an ulcer on her right great toe as well as the fourth and fifth toes. 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 She also had an angiogram at that time and underwent angioplasty of the right anterior tibial artery. In any case she claims that the wound on the right foot is closed I did not look at this today which was probably an oversight although I think that should be done next week. After her surgery she developed a dehiscence but I do not see any follow-up of this. According to Dr. Deborra Medina last review that she was out of the area being cared for by another physician but recently came back to his attention. 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 A culture of this area showed E. coli apparently before she came back to see Dr. Luana Shu she was supposed to be receiving antibiotics but she did not really take them. Nor is she offloading this area at all. Finally her last hemoglobin A1c listed in epic was in March 2022 at 14.1 she says things are a lot better since then although I am not  sure. 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 She had this wound at the time. During this admission she had E. coli sepsis a TEE was negative. 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 She is still a heavy cigarette smoker. She has a history of bipolar disorder seizure disorder ABI in our clinic was 1.05 6/1; left midfoot in the setting of a TMA done previously. Round circular wound with a "knuckle" of protruding tissue. The problem is that the knuckle was not attached to any of the surrounding granulation and this probed proximally widely I removed a large portion of this tissue. This wound goes with considerable Dennard, Amnah A ([id number]) [id number] Page 15 of 19 undermining laterally. I do not feel any bone there was no purulence but this is a deep wound. 6/8; in spite of the debridement I did last week. She arrives with a wound looking exactly the same. A protruding "knuckle" of tissue nonadherent to most of the surrounding tissue. There is considerable depth around this from 6-12 o'clock at 2.7 cm and undermining of 1 cm. This does not look overtly infected and the x- ray I did last week was negative for any osseous abnormalities. We have been using silver collagen 6/15; deep tissue culture I did last week showed moderate staph aureus and moderate Pseudomonas. This will definitely require prolonged antibiotic therapy. The pathology on the protuberant area was negative for malignancy fungus etc. 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 We have been using silver  collagen. I am going to be prescribing Levaquin for 2 weeks. Her CT scan of the foot is down for 7/5 6/22; CT scan of the foot on 7 5. She says she has hardware in the left  leg from her previous fracture. She is on the Levaquin for the deep tissue culture I did that showed methicillin sensitive staph aureus and Pseudomonas. I gave her a 2-week supply and she will have another week. She arrives in clinic today with the same protuberant tissue however this is nonadherent to the tissue surrounding it. I am really at a loss to explain this unless there is underlying deep tissue infection 6/29; patient presents for 1 week follow-up. She has been using collagen to the wound bed. She reports taking her antibiotics as prescribed.She has no complaints or issues today. She denies signs of infection. 7/6; patient presents for one week followup. She has been using collagen to the wound bed. She states she is taking Levaquin however at times she is not able to keep it down. She denies signs of infection. 7/13; patient presents for 1 week follow-up. She has been using silver alginate to the wound bed. She still has nausea when taking her antibiotics. She denies signs of infection. 7/20; patient presents for 1 week follow-up. She has been using silver alginate with gentamicin cream to the wound bed. She denies any issues and has no complaints today. She denies signs of infection. 7/27; patient presents for 1 week follow-up. She continues to use silver alginate with gentamicin cream to the wound bed. She reports starting her antibiotics. She has no issues or complaints. Overall she reports stability to the wound. 8/3; patient presents for 1 week follow-up. She has been using silver alginate with gentamicin cream to the wound bed. She reports completing all antibiotics. She has no issues or complaints today. She denies signs of infection. 8/17; patient presents for 2-week follow-up. He is to use silver alginate to  the wound bed. She has no issues or complaints today. She denies signs of infection. She reports her pain has improved in her foot since last clinic visit 8/24; patient presents for 1 week follow-up. She continues to use silver alginate to the wound bed. She has no issues or complaints. She denies signs of infection. Pain is stable. 9/7; patient presents for follow-up. She missed her last week appointment due to feeling ill. She continues to use silver alginate. She has a new wound to the right lower extremity that is covered in eschar. She states It occurred over the past week and has no idea how it started. She currently denies signs of infection. 9/14; patient presents for follow-up. T the left foot wound she has been using gentamicin cream and silver alginate. T the right lower extremity wound she has o o been keeping this covered and has not obtain Santyl. 9/21; patient presents for follow-up. She reports using gentamicin cream and silver alginate to the left foot and Santyl to the right lower extremity wound. She has no issues or complaints today. She denies signs of infection. 9/28; patient presents for follow-up. She reports a new wound to her right heel. She states this occurred a few days ago and is progressively gotten worse. She has been trying to clean the area with a Q-tip and Santyl. She reports stability in the other 2 wounds. She has been using gentamicin cream and silver alginate to the left foot and Santyl to the right lower extremity wound. 10/12; patient presents for follow-up. She reports improvement to the wound beds. She is seeing vein and vascular to discuss the potential of a left BKA. She states they are going to do an arteriogram. She continues to use silver alginate with dressing  changes to her wounds. 11/2; patient presents for follow-up. She states she has not been doing dressing changes to the wound beds. She states she is not able to offload the areas. She reports  chronic pain to her left foot wound. 11/9; patient presents for follow-up. She came in with only socks on. She states she forgot to put on shoes. It is unclear if she is doing any dressing changes. She currently denies systemic signs of infection. 11/16; patient presents for follow-up. She came again only with socks on. She states she does not wear shoes ever. It is unclear if she does dressing changes. She currently denies systemic signs of infection. 11/23; patient presents for follow-up. She wore her shoes today. It still unclear exactly what dressing she is using for each wound but she did states she obtained Dakin's solution and has been using this to the left foot wound. She currently denies signs of infection. 11/30; patient presents for follow-up. She has no issues or complaints today. She currently denies signs of infection. 12/7; patient presents for follow-up. She has no issues or complaints today. She has been using Hydrofera Blue to the right heel wound and Dakin solution to the left foot wound. Her right anterior leg wound is healed. She currently denies signs of infection. 12/14; patient presents for follow-up. She has been using Hydrofera Blue to the right heel and Dakin's to the left foot wounds. She has no issues or complaints today. She denies signs of infection. 12/21; patient presents for follow-up. She reports using Hydrofera Blue to the right heel and Dakin's to the left foot wound. She denies signs of infection. 12/28; patient presents for follow-up. She continues to use Dakin's to the left foot wound and Hydrofera Blue to the right heel wound. She denies signs of infection. 1/4; patient presents for follow-up. She has no issues or complaints today. She denies signs of infection. 1/11; patient presents for follow-up. It is unclear if she has been dressing these wounds over the past week. She currently denies signs of infection. 1/18; patient presents for follow-up. She states  she has been using Dakin's wet-to-dry dressings to the left foot. She has been using Hydrofera Blue to the right foot foot wound. She states that the anterior right leg wound has reopened and draining serous fluid. She denies signs of infection. 1/25; patient presents for follow-up. She has no issues or complaints today. 2/1; patient presents for follow-up. She has no issues or complaints today. She denies signs of infection. 2/8; patient presents for follow-up. She has lost her surgical shoes. She did not have a dressing to the right heel wound. She currently denies signs of infection. 2/15; patient presents for follow-up. She reports more pain to the right heel today. She denies purulent drainage Or fever/chills 2/22; patient presents for follow-up. She reports taking clindamycin over the past week. She states that she continues to have pain to her right heel. She reports purulent drainage. Readmission 03/16/2022 Ms. Liberta Gimpel is a 47 year old female with a past medical history of type 2 diabetes, osteomyelitis to her feet, chronic systolic heart failure and bipolar disorder that presents to the clinic for bilateral feet wounds and right lower extremity wound. She was last seen in our clinic on 12/15/2021. At that time she had purulent drainage coming out of her right plantar foot and I recommended she go to the ED. She states she went to [facility] and has been there for the past 3 months. I  cannot see the records. She states she had OR debridement and was on several weeks of IV antibiotics while inpatient. Since discharge she has not been taking care of the wound beds. She had nothing on her feet other than socks today. She currently denies signs of infection. YOLANDE, SKODA A ([id number]) [id number] Page 16 of 19 5/31; patient presents for follow-up. She has been using Dakin's wet-to-dry dressings to the wound beds on her feet bilaterally and antibiotic  ointment to the right anterior leg wound. She had a wound culture done at last clinic visit that showed moderate Pseudomonas aeruginosa sensitive to ciprofloxacin. She currently denies systemic signs of infection. 6/14; patient presents for follow-up. She received Keystone 5 days ago and has been using this on the wound beds. She states that last week she had to go to the hospital because she had increased warmth and erythema to the right foot. She was started on 2 oral antibiotics. She states she has been taking these. She currently denies systemic signs of infection. She has no issues or complaints today. 6/21; patient presents for follow-up. She states she has been using Keystone antibiotics to the wound beds. She has no issues or complaints today. She denies signs of infection. 6/28; patient presents for follow-up. She has been using Keystone antibiotics to the wound beds. She has no issues or complaints today. 7/12; patient presents for follow-up. Has been using Keystone antibiotics to the wound beds with calcium alginate. She has no issues or complaints today. She never followed up with her orthopedic surgeon who did the OR debridement to the right foot. We discussed the total contact cast for the left foot and patient would like to do this next week. 7/19; patient presents for follow-up. She has been using Keystone antibiotics with calcium alginate to the wound beds. She has no issues or complaints today. Patient is in agreement to do the total contact cast of the left foot today. She knows to return later this week for the obligatory cast change. 05-13-2022 upon evaluation today patient's wound which she has the cast of the left leg actually appears to be doing significantly better. Fortunately I do not see any signs of active infection locally or systemically which is great news and overall I am extremely pleased with where we stand currently. 7/26; patient presents for follow-up. She has a  cast in place for the past week. She states it irritated her shin. Other than that she tolerated the cast well. She states she would like a break for 1 week from the cast. We have been using Keystone antibiotic and Aquacel to both wound beds. She denies signs of infection. 8/2; patient presents for follow-up. She has been using Keystone and Aquacel to the wound beds. She denies any issues and has no complaints. She is agreeable to have the cast placed today for the left leg. 06-03-2022 upon evaluation today patient appears to be doing well with regard to her wound she saw some signs of improvement which is great news. Fortunately I do not see any evidence of active infection locally or systemically at this time which is great news. No fevers, chills, nausea, vomiting, or diarrhea. 8/16; patient presents for follow-up. She has no issues or complaints today. We have been using Keystone and Aquacel to the wound beds. The left lower extremity is in a total contact cast. She is tolerated this well. 8/23; patient presents for follow-up. She has had the total contact cast on the left leg for  the past week. Unfortunately this has rubbed and broken down the skin to the medial foot. She currently denies signs of infection. She has been using Keystone antibiotic to the right plantar foot wound. 8/30; patient presents for follow-up. We have held off on the total contact cast for the left leg for the past week. Her wound on the left foot has improved and the previous surrounding breakdown of skin has epithelialized. She has been using Keystone antibiotic to both wound beds. She has no issues or complaints today. She denies signs of infection. 9/6; patient presents for follow-up. She has ordered her's Keystone antibiotic and this is arriving this week. She has been doing Dakin's wet-to-dry dressings to the wound beds. She denies signs of infection. She is agreeable to the total contact cast today. 9/13; patient  presents for follow-up. She states that the cast caused her left leg shin to be sore. She would like to take a break from the cast this week. She has been using Keystone antibiotic to the right plantar foot wound. She denies signs of infection. 9/20; patient presents for follow-up. She has been using Keystone antibiotics to the wound beds with calcium alginate to the right foot wound and Hydrofera Blue to the left foot wound. She is agreeable to having the cast placed today. She has been approved for Apligraf and we will order this for next clinic visit. 9/27; patient presents for follow-up. We have been using Keystone antibiotic with Hydrofera Blue to the left foot wound under a total contact cast. T the right o foot wound she has been using Keystone antibiotic and calcium alginate. She declines a total contact cast today. Apligraf is available for placement and she would like to proceed with this. 07-28-2022 upon evaluation today patient appears to be doing well currently in regard to her wound. She is actually showing signs of significant improvement which is great news. Fortunately I do not see any evidence of active infection locally nor systemically at this time. She has been seeing Dr. Heber [place] and to be honest has been doing very well with the cast. Subsequently she comes in today with a cast on and we did reapply that today as well. She did not really want to she try to talk me out of that but I explained that if she wanted to heal this is really the right way to go. Patient voiced understanding. In regard to her right foot this is actually a lot better compared to the last time I saw her which is also great news. 10/11; patient presents for follow-up. Apligraf and the total contact cast was placed to the left leg at last clinic visit. She states that her right foot wound had burning pain to it with the placement of Apligraf to this area. She has been doing [place] over this area. She denies  signs of infection including increased warmth, erythema or purulent drainage. 11/1; 3-week follow-up. The patient fortunately did not have a total contact cast or an Apligraf and on the left foot. She has been using Keystone ABD pads and kerlix and her own running shoes She arrives in clinic today with thick callus and a very poor surface on the left foot on the right nonviable skin subcutaneous tissue and a deep probing hole. 11/15; patient missed her last clinic appointment. She states she has not been dressing the wound beds for the past 2 weeks. She states that at she had a new roommate but is now going back to  live with her mother. Apparently its been a distracting 2 weeks. Patient currently denies signs of infection. 11/22; patient presents for follow-up. She states she has been using Keystone antibiotic and Dakin's wet-to-dry dressings to the wound beds. She is agreeable for cast placement today. We had ordered Apligraf however this has not been received by our facility. 11/29; Patient had a total contact cast placed at last clinic visit and she tolerated this well. We were using silver alginate under the cast. Patient's been using Keystone antibiotic with Aquacel to the right plantar foot wound. She has no issues or complaints today. Apligraf is available for placement today. Patient would like to proceed with this. 12/6; patient presents for follow-up. She had Apligraf placed in standard fashion last clinic visit under the total contact cast to the left lower extremity. She has been using Keystone antibiotic and Aquacel to the right plantar foot wound. She has no issues or complaints today. 12/13; patient presents for follow-up. She has finished 5 Apligraf placements. Was told she would not qualify for more. We have been doing a total contact cast to the left lower extremity. She has been using Keystone antibiotic and Aquacel to the right plantar foot wound. She has no issues or complaints  today. 12/20; patient presents for follow-up. We have been using Hydrofera Blue with Keystone antibiotic under a total contact cast of the left lower extremity. She reports using Keystone antibiotic and silver alginate to the right heel wound. She has no issues or complaints today. ASHLEIGH, LUCKOW A ([id number]) [id number] Page 17 of 19 12/27; patient presents with a healthy wound on the left midfoot. We have Apligraf to apply that to that more also using a total contact cast. On the right we are using Keystone and silver alginate. She is offloading the right heel with a surgical shoealthough by her admission she is on her feet quite a bit 1/30; patient presents for follow-up. Apligraf was placed to the wound beds last clinic visit. She was placed in a total contact cast to the left lower extremity. She declines a total contact cast today. She states that her mother is in the hospital and she cannot adequately get around with the cast on. Objective Constitutional Vitals Time Taken: 11:19 AM, Height: 69 in, Weight: 178 lbs, BMI: 26.3, Temperature: 97.7 F, Pulse: 63 bpm, Respiratory Rate: 16 breaths/min, Blood Pressure: 130/81 mmHg. General Notes: Right foot: T the plantar heel there is an incision site with increased depth. T the opening there is Nonviable tissue and granulation tissue. Left o o foot: T the medial aspect there is an open wound with granulation tissue and nonviable tissue with surrounding callus. No overt signs of infection to any of the o wound beds. Integumentary (Hair, Skin) Wound #11 status is Open. Original cause of wound was Surgical Injury. The date acquired was: 12/01/2021. The wound has been in treatment 32 weeks. The wound is located on the [place]. The wound measures 0.1cm length x 0.2cm width x 0.9cm depth; 0.016cm^2 area and 0.014cm^3 volume. There is Fat Layer (Subcutaneous Tissue) exposed. There is a medium amount of  serosanguineous drainage noted. The wound margin is distinct with the outline attached to the wound base. There is medium (34-66%) pink, pale granulation within the wound bed. There is a medium (34-66%) amount of necrotic tissue within the wound bed. Wound #12 status is Open. Original cause of wound was Pressure Injury. The date acquired was: 03/16/2020. The wound has been in treatment 32 weeks.  The wound is located on the [place]. The wound measures 1.2cm length x 0.7cm width x 0.1cm depth; 0.66cm^2 area and 0.066cm^3 volume. There is Fat Layer (Subcutaneous Tissue) exposed. There is a medium amount of serous drainage noted. The wound margin is flat and intact. There is large (67-100%) red, pink granulation within the wound bed. There is no necrotic tissue within the wound bed. 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 I debrided nonviable tissue here and recommended going back to [facility] antibiotic and Aquacel Ag. Continue aggressive offloading with surgical shoe and peg assist. The left foot wound appears well-healing. I debrided nonviable tissue here. Due to the beginning of the year we will have to run insurance verification for Apligraf. For now I recommended Hydrofera Blue and Keystone antibiotic ointment. Unfortunately patient declines the total contact cast today. We discussed the importance of aggressive offloading for her wound healing. Every time she stops the cast in the past her wound has rapidly declined. She is at risk for infection and  amputation. Despite discussing all this she continues to decline the cast. I recommended continuing to aggressively offload this area with her surgical shoe and peg assist. Procedures Wound #11 Pre-procedure diagnosis of Wound #11 is an Open Surgical Wound located on the [place] . There was a Excisional Skin/Subcutaneous Tissue Debridement with a total area of 7 sq cm performed by Kalman Shan, MD. With the following instrument(s): Curette to remove Viable and Non-Viable tissue/material. Material removed includes Callus, Subcutaneous Tissue, and Slough. A time out was conducted at 11:42, prior to the start of the procedure. A Minimum amount of bleeding was controlled with Pressure. The procedure was tolerated well. Post Debridement Measurements: 0.1cm length x 0.1cm width x 0.9cm depth; 0.007cm^3 volume. Character of Wound/Ulcer Post Debridement is stable. 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 There was a Excisional Skin/Subcutaneous Tissue Debridement with a total area of 3 sq cm performed by Kalman Shan, MD. With the following instrument(s): Curette to remove Viable and Non-Viable tissue/material. Material removed includes Callus, Subcutaneous Tissue, and Slough. A time out was conducted at 11:46, prior to the start of the procedure. A Minimum amount of bleeding was controlled with Pressure. The procedure was tolerated well. Post Debridement Measurements: 1.2cm length x 0.7cm width x 0.1cm depth; 0.066cm^3 volume. Character of Wound/Ulcer Post Debridement is stable. Severity of Tissue Post Debridement is: Fat layer exposed. Post procedure Diagnosis Wound #12: Same as Pre-Procedure AHAVA, KISSOON A ([id number]) [id number] Page 18 of  19 Plan Follow-up Appointments: Return Appointment in 1 week. Nurse Visit as needed Bathing/ Shower/ Hygiene: May shower with wound dressing protected with water repellent cover or cast protector. No tub bath. Anesthetic (Use 'Patient Medications' Section for Anesthetic Order Entry): Lidocaine applied to wound bed Cellular or Tissue Based Products: Cellular or Tissue Based Product Type: - Apligraf # 1 Edema Control - Lymphedema / Segmental Compressive Device / Other: Elevate,  Exercise Daily and Avoid Standing for Long Periods of Time. Elevate legs to the level of the heart and pump ankles as often as possible Elevate leg(s) parallel to the floor when sitting. Off-Loading: T Contact Cast to Left Lower Extremity otal Open toe surgical shoe - Heel off loader - Right foot Other: - keep pressure off of feet. Medications-Please add to medication list.: Wound #12 Left,Medial,Plantar Foot: Keystone Compound - Keystone bilat feet WOUND #11: - Foot Wound Laterality: Plantar, Right Cleanser: Dakin 16 (oz) 0.25 1 x Per Day/30 Days Discharge Instructions: Use as directed. Cleanser: Soap and Water 1 x Per Day/30 Days Discharge Instructions: Gently cleanse wound with antibacterial soap, rinse and pat dry prior to dressing wounds Topical: keystone gel 1 x Per Day/30 Days Prim Dressing: Aquacel Extra Hydrofiber Dressing, 2x2 (in/in) 1 x Per Day/30 Days ary Secondary Dressing: ABD Pad 5x9 (in/in) (Generic) 1 x Per Day/30 Days Discharge Instructions: Cover with ABD pad Secured With: Medipore T - 60M Medipore H Soft Cloth Surgical T ape ape, 2x2 (in/yd) (Generic) 1 x Per Day/30 Days Secured With: Kerlix Roll Sterile or Non-Sterile 6-ply 4.5x4 (yd/yd) (Generic) 1 x Per Day/30 Days Discharge Instructions: Apply Kerlix as directed WOUND #12: - Foot Wound Laterality: Plantar, Left, Medial Cleanser: Dakin 16 (oz) 0.25 (Dispense As Written) 1 x Per Day/30 Days Discharge Instructions: Use as  directed. Topical: Vitamin AandD Ointment, 4(oz) tube 1 x Per Day/30 Days Discharge Instructions: apply to red, broken down area Prim Dressing: Hydrofera Blue Ready Transfer Foam, 2.5x2.5 (in/in) 1 x Per Day/30 Days ary Discharge Instructions: Apply Hydrofera Blue Ready to wound bed as directed Prim Dressing: Keystone gel 1 x Per Day/30 Days ary Secondary Dressing: ABD Pad 5x9 (in/in) (Generic) 1 x Per Day/30 Days Discharge Instructions: Cover with ABD pad Secured With: Medipore T - 60M Medipore H Soft Cloth Surgical T ape ape, 2x2 (in/yd) (Generic) 1 x Per Day/30 Days Secured With: Kerlix Roll Sterile or Non-Sterile 6-ply 4.5x4 (yd/yd) (Generic) 1 x Per Day/30 Days Discharge Instructions: Apply Kerlix as directed 1. In office sharp debridement 2. Aquacel Ag with Keystone antibiotic ointmentooright foot 3. Hydrofera Blue with Keystone antibiotic ointmentooleft foot 4. Aggressive offloadingoosurgical shoe with peg assist 5. Follow-up in 1 week Electronic Signature(s) Signed: 10/26/2022 1:46:18 PM By: Kalman Shan DO Entered By: Kalman Shan on 10/26/2022 12:10:38 -------------------------------------------------------------------------------- SuperBill Details Patient Name: Date of Service: Heather Hitch A. 10/26/2022 Medical Record Number: [id number] Patient Account Number: [id number] Date of Birth/Sex: Treating RN: Sep 12, 1976 (47 y.o. [name] Primary Care Provider: Raelene Bott Other Clinician: Michell, Giuliano A ([id number]) [id number] Page 19 of 19 Referring Provider: Treating Provider/Extender: Eddie North in Treatment: 32 Diagnosis Coding ICD-10 Codes Code Description [phone] 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ity 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.621 Type 2 diabetes mellitus with foot  ulcer E11.42 Type 2 diabetes mellitus with diabetic polyneuropathy L97.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 of skin Facility Procedures : CPT4 Code: [id number] Description: 99357 - DEB SUBQ TISSUE 20 SQ CM/< ICD-10 Diagnosis Description 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 seve L97.512 Non-pressure chronic ulcer of other part of right foot with fat layer exposed  E11.621 Type 2 diabetes mellitus with foot ulcer Modifier: rity Quantity: 1 Physician Procedures : CPT4 Code Description Modifier [id number] -  WC PHYS SUBQ TISS 20 SQ CM ICD-10 Diagnosis Description 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ity L97.512 Non-pressure chronic ulcer of other part of right  foot with fat layer exposed E11.621 Type 2 diabetes mellitus with foot ulcer Quantity: 1 Electronic Signature(s) Signed: 10/26/2022 1:46:18 PM By: Kalman Shan DO Entered By: Kalman Shan on 10/26/2022 12:11:02

## 2022-10-26 NOTE — Progress Notes (Signed)
CYLEIGH, MASSARO [name] ([id number]) [id number] Page 1 of 8 Visit Report for 10/26/2022 Arrival Information Details Patient Name: Date of Service: [name], RAYLE 10/26/2022 11:00 [name]: [id number] Patient Account Number: [id number] Date of Birth/Sex: Treating RN: [birth year] (47 y.o. [name] Primary Care Micael Barb: Raelene Bott Other Clinician: Massie Kluver Referring Aastha Dayley: Treating Severo Beber/Extender: Eddie North in Treatment: 10 Visit Information History Since Last Visit All ordered tests and consults were completed: No Patient Arrived: Wheel Chair Added or deleted any medications: No Arrival Time: 11:09 Any new allergies or adverse reactions: No Transfer Assistance: EasyPivot Patient Lift Had [name] fall or experienced change in No Patient Identification Verified: Yes activities of daily living that may affect Secondary Verification Process Completed: Yes risk of falls: Patient Requires Transmission-Based Precautions: No Signs or symptoms of abuse/neglect since last visito No Patient Has Alerts: No Hospitalized since last visit: No Implantable device outside of the clinic excluding No cellular tissue based products placed in the center since last visit: Has Dressing in Place as Prescribed: Yes Has Footwear/Offloading in Place as Prescribed: Yes Left: T Contact Cast otal Pain Present Now: Yes Electronic Signature(s) Signed: 10/26/2022 2:03:30 PM By: Massie Kluver Entered By: Massie Kluver on 10/26/2022 11:18:37 -------------------------------------------------------------------------------- Clinic Level of Care Assessment Details Patient Name: Date of Service: [name], KELM 10/26/2022 11:00 [name]: [id number] Patient Account Number: [id number] Date of Birth/Sex: Treating RN: 11/24/1975 (47 y.o. [name] Primary Care Zymere Patlan: Raelene Bott Other Clinician: Massie Kluver Referring Dawnisha Marquina: Treating Lesha Jager/Extender: Eddie North in Treatment: 32 Clinic Level of Care Assessment Items TOOL 1 Quantity Score []  - 0 Use when EandM and Procedure is performed on INITIAL visit ASSESSMENTS - Nursing Assessment / Reassessment []  - 0 General Physical Exam (combine w/ comprehensive assessment (listed just below) when performed on new pt. evals) JAYAH, BALTHAZAR [name] ([id number]) [id number] Page 2 of 8 []  - 0 Comprehensive Assessment (HX, ROS, Risk Assessments, Wounds Hx, etc.) ASSESSMENTS - Wound and Skin Assessment / Reassessment []  - 0 Dermatologic / Skin Assessment (not related to wound area) ASSESSMENTS - Ostomy and/or Continence Assessment and Care []  - 0 Incontinence Assessment and Management []  - 0 Ostomy Care Assessment and Management (repouching, etc.) PROCESS - Coordination of Care []  - 0 Simple Patient / Family Education for ongoing care []  - 0 Complex (extensive) Patient / Family Education for ongoing care []  - 0 Staff obtains Programmer, systems, Records, T Results / Process Orders est []  - 0 Staff telephones HHA, Nursing Homes / Clarify orders / etc []  - 0 Routine Transfer to another Facility (non-emergent condition) []  - 0 Routine Hospital Admission (non-emergent condition) []  - 0 New Admissions / Biomedical engineer / Ordering NPWT Apligraf, etc. , []  - 0 Emergency Hospital Admission (emergent condition) PROCESS - Special Needs []  - 0 Pediatric / Minor Patient Management []  - 0 Isolation Patient Management []  - 0 Hearing / Language / Visual special needs []  - 0 Assessment of Community assistance (transportation, D/C planning, etc.) []  - 0 Additional assistance / Altered mentation []  - 0 Support Surface(s) Assessment (bed, cushion, seat, etc.) INTERVENTIONS - Miscellaneous []  - 0 External ear exam []  - 0 Patient Transfer (multiple staff / Civil Service fast streamer / Similar devices) []  -  0 Simple Staple / Suture removal (25 or less) []  - 0 Complex Staple / Suture removal (26 or more) []  - 0 Hypo/Hyperglycemic Management (do not check if billed separately) []  -  0 Ankle / Brachial Index (ABI) - do not check if billed separately Has the patient been seen at the hospital within the last three years: Yes Total Score: 0 Level Of Care: ____ Electronic Signature(s) Signed: 10/26/2022 2:03:30 PM By: Massie Kluver Entered By: Massie Kluver on 10/26/2022 11:51:27 -------------------------------------------------------------------------------- Encounter Discharge Information Details Patient Name: Date of Service: Heather Hitch [name]. 10/26/2022 11:00 [name]: [id number] Patient Account Number: [id number] Date of Birth/Sex: Treating RN: [birth year] (47 y.o. [name] ([id number]) [phone].pdf Page 3 of 8 Primary Care Peng Thorstenson: Raelene Bott Other Clinician: Massie Kluver Referring Kamaree Berkel: Treating Mahsa Hanser/Extender: Eddie North in Treatment: 28 Encounter Discharge Information Items Post Procedure Vitals Discharge Condition: Stable Temperature (F): 97.7 Ambulatory Status: Wheelchair Pulse (bpm): 63 Discharge Destination: Home Respiratory Rate (breaths/min): 16 Transportation: Private Auto Blood Pressure (mmHg): 130/81 Accompanied By: self Schedule Follow-up Appointment: Yes Clinical Summary of Care: Electronic Signature(s) Signed: 10/26/2022 2:03:30 PM By: Massie Kluver Entered By: Massie Kluver on 10/26/2022 12:05:27 -------------------------------------------------------------------------------- Lower Extremity Assessment Details Patient Name: Date of Service: [name] 10/26/2022 11:00 [name]: [id number] Patient Account Number: [id number] Date of Birth/Sex: Treating RN: 1976-05-02 (47 y.o. [name] Primary Care Vonnie Spagnolo: Raelene Bott Other  Clinician: Massie Kluver Referring Drea Jurewicz: Treating Akshita Italiano/Extender: Eddie North in Treatment: 32 Electronic Signature(s) Signed: 10/26/2022 2:03:30 PM By: Massie Kluver Signed: 10/26/2022 4:52:31 PM By: Gretta Cool BSN, RN, CWS, [name] RN, BSN Entered By: Massie Kluver on 10/26/2022 11:35:07 -------------------------------------------------------------------------------- Multi Wound Chart Details Patient Name: Date of Service: Heather Hitch [name]. 10/26/2022 11:00 [name]: [id number] Patient Account Number: [id number] Date of Birth/Sex: Treating RN: 05-24-1976 (47 y.o. [name] Primary Care Alayne Estrella: Raelene Bott Other Clinician: Massie Kluver Referring Marget Outten: Treating Kaysen Deal/Extender: Eddie North in Treatment: 32 Vital Signs Height(in): 69 Pulse(bpm): 63 Weight(lbs): 178 Blood Pressure(mmHg): 130/81 Body Mass Index(BMI): 26.3 Temperature(F): 97.7 Respiratory Rate(breaths/min): 16 [place], Norine [name] ([id number]) [id number] Page 4 of 8 [11:Photos:] [N/[name]:N/[name]] Right, Plantar Foot Left, Medial, Plantar Foot N/[name] Wound Location: Surgical Injury Pressure Injury N/[name] Wounding Event: Open Surgical Wound 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: Extremity Chronic sinus problems/congestion, Chronic sinus problems/congestion, N/[name] Comorbid History: Middle ear problems, Anemia, Chronic Middle ear problems, Anemia, Chronic Obstructive Pulmonary Disease Obstructive Pulmonary Disease (COPD), Congestive Heart Failure, (COPD), Congestive Heart Failure, Type II Diabetes, End Stage Renal Type II Diabetes, End Stage Renal Disease, History of pressure wounds, Disease, History of pressure wounds, Neuropathy Neuropathy 12/01/2021 03/16/2020 N/[name] Date Acquired: 60 32 N/[name] Weeks of Treatment: Open Open N/[name] Wound Status: No No N/[name] Wound Recurrence: 0.1x0.2x0.9 1.2x0.7x0.1  N/[name] Measurements L x W x D (cm) 0.016 0.66 N/[name] [name] (cm) : rea 0.014 0.066 N/[name] Volume (cm) : 99.10% 86.00% N/[name] % Reduction in Area: 99.20% 93.00% N/[name] % Reduction in Volume: Full Thickness Without Exposed Grade 3 N/[name] Classification: Support Structures Medium Medium N/[name] Exudate Amount: Serosanguineous Serous N/[name] Exudate Type: red, brown amber N/[name] Exudate Color: Distinct, outline attached Flat and Intact N/[name] Wound Margin: Medium (34-66%) Large (67-100%) N/[name] Granulation Amount: Pink, Pale Red, Pink N/[name] Granulation Quality: Medium (34-66%) None Present (0%) N/[name] Necrotic Amount: Fat Layer (Subcutaneous Tissue): Yes Fat Layer (Subcutaneous Tissue): Yes N/[name] Exposed Structures: Fascia: No Fascia: No Tendon: No Tendon: No Muscle: No Muscle: No Joint: No Joint: No Bone: No Bone: No Medium (34-66%) Small (1-33%) N/[name] Epithelialization: Treatment Notes Electronic Signature(s) Signed: 10/26/2022  2:03:30 PM By: Massie Kluver Entered By: Massie Kluver on 10/26/2022 11:35:11 -------------------------------------------------------------------------------- Pain Assessment Details Patient Name: Date of Service: [name], [name] 10/26/2022 11:00 [name]: [id number] Patient Account Number: [id number] Date of Birth/Sex: Treating RN: 1976-02-19 (47 y.o. [name] Primary Care Susanne Baumgarner: Raelene Bott Other Clinician: Massie Kluver Referring Kumari Sculley: Treating Teghan Philbin/Extender: Eddie North in Treatment: [address] [place] ([id number]) [id number] Page 5 of 8 Active Problems Location of Pain Severity and Description of Pain Patient Has Paino Yes Site Locations Pain Location: Pain in Ulcers Duration of the Pain. Constant / Intermittento Constant Rate the pain. Current Pain Level: 5 Character of Pain Describe the Pain: Burning, Shooting Pain Management and Medication Current Pain  Management: Medication: Yes Cold Application: No Rest: No Massage: No Activity: No T.E.N.S.: No Heat Application: No Leg drop or elevation: No Is the Current Pain Management Adequate: Inadequate How does your wound impact your activities of daily livingo Sleep: No Bathing: No Appetite: No Relationship With Others: No Bladder Continence: No Emotions: No Bowel Continence: No Work: No Toileting: No Drive: No Dressing: No Hobbies: No Electronic Signature(s) Signed: 10/26/2022 2:03:30 PM By: Massie Kluver Signed: 10/26/2022 4:52:31 PM By: Gretta Cool, BSN, RN, CWS, [name] RN, BSN Entered By: Massie Kluver on 10/26/2022 11:22:11 -------------------------------------------------------------------------------- Wound Assessment Details Patient Name: Date of Service: Heather Hitch [name]. 10/26/2022 11:00 [name]: [id number] Patient Account Number: [id number] Date of Birth/Sex: Treating RN: January 12, 1976 (47 y.o. [name] Primary Care Saprina Chuong: Raelene Bott Other Clinician: Massie Kluver Referring Ondre Salvetti: Treating Leanza Shepperson/Extender: Linna Hoff Weeks in Treatment: 32 Wound Status Wound Number: 11 Primary Open Surgical Wound Etiology: Wound Location: Right, Plantar Foot Wound Open Wounding Event: Surgical Injury Status: SOLYMAR, GRACE [name] ([id number]) [id number] Page 6 of 8 Status: Date Acquired: 12/01/2021 Comorbid Chronic sinus problems/congestion, Middle ear problems, Anemia, Weeks Of Treatment: 32 History: Chronic Obstructive Pulmonary Disease (COPD), Congestive Heart Clustered Wound: No Failure, Type II Diabetes, End Stage Renal Disease, History of pressure wounds, Neuropathy Photos Wound Measurements Length: (cm) 0.1 Width: (cm) 0.2 Depth: (cm) 0.9 Area: (cm) 0.016 Volume: (cm) 0.014 % Reduction in Area: 99.1% % Reduction in Volume: 99.2% Epithelialization: Medium (34-66%) Wound Description Classification:  Full Thickness Without Exposed Suppo Wound Margin: Distinct, outline attached Exudate Amount: Medium Exudate Type: Serosanguineous Exudate Color: red, brown rt Structures Foul Odor After Cleansing: No Slough/Fibrino Yes Wound Bed Granulation Amount: Medium (34-66%) Exposed Structure Granulation Quality: Pink, Pale Fascia Exposed: No Necrotic Amount: Medium (34-66%) Fat Layer (Subcutaneous Tissue) Exposed: Yes Tendon Exposed: No Muscle Exposed: No Joint Exposed: No Bone Exposed: No Treatment Notes Wound #11 (Foot) Wound Laterality: Plantar, Right Cleanser Dakin 16 (oz) 0.25 Discharge Instruction: Use as directed. Soap and Water Discharge Instruction: Gently cleanse wound with antibacterial soap, rinse and pat dry prior to dressing wounds Peri-Wound Care Topical keystone gel Primary Dressing Aquacel Extra Hydrofiber Dressing, 2x2 (in/in) Secondary Dressing ABD Pad 5x9 (in/in) Discharge Instruction: Cover with ABD pad Secured With Medipore T - 40M Medipore H Soft Cloth Surgical T ape ape, 2x2 (in/yd) Kerlix Roll Sterile or Non-Sterile 6-ply 4.5x4 (yd/yd) Discharge Instruction: Apply Kerlix as directed Compression Wrap Compression Stockings Add-Ons Bruning, Janneth [name] ([id number]) [id number] Page 7 of 8 Electronic Signature(s) Signed: 10/26/2022 2:03:30 PM By: Massie Kluver Signed: 10/26/2022 4:52:31 PM By: Gretta Cool, BSN, RN, CWS, [name] RN, BSN Entered By: Massie Kluver on 10/26/2022 11:34:19 -------------------------------------------------------------------------------- Wound Assessment Details Patient Name: Date of Service: Heather Hitch  [name]. 10/26/2022 11:00 [name]: [id number] Patient Account Number: [id number] Date of Birth/Sex: Treating RN: March 27, 1976 (47 y.o. [name], [name] Primary Care Savanna Dooley: Raelene Bott Other Clinician: Massie Kluver Referring Feliberto Stockley: Treating Pierre Cumpton/Extender: Linna Hoff Weeks  in Treatment: 32 Wound Status Wound Number: 12 Primary Diabetic Wound/Ulcer of the Lower Extremity Etiology: Wound Location: Left, Medial, Plantar Foot Wound Open Wounding Event: Pressure Injury Status: Date Acquired: 03/16/2020 Comorbid Chronic sinus problems/congestion, Middle ear problems, Anemia, Weeks Of Treatment: 32 History: Chronic Obstructive Pulmonary Disease (COPD), Congestive Heart Clustered Wound: No Failure, Type II Diabetes, End Stage Renal Disease, History of pressure wounds, Neuropathy Photos Wound Measurements Length: (cm) 1.2 Width: (cm) 0.7 Depth: (cm) 0.1 Area: (cm) 0.66 Volume: (cm) 0.066 % Reduction in Area: 86% % Reduction in Volume: 93% Epithelialization: Small (1-33%) Wound Description Classification: Grade 3 Wound Margin: Flat and Intact Exudate Amount: Medium Exudate Type: Serous Exudate Color: amber Foul Odor After Cleansing: No Slough/Fibrino No Wound Bed Granulation Amount: Large (67-100%) Exposed Structure Granulation Quality: Red, Pink Fascia Exposed: No Necrotic Amount: None Present (0%) Fat Layer (Subcutaneous Tissue) Exposed: Yes Tendon Exposed: No Muscle Exposed: No Joint Exposed: No Bone Exposed: No Hudspeth, Charidy [name] ([id number]) [id number] Page 8 of 8 Treatment Notes Wound #12 (Foot) Wound Laterality: Plantar, Left, Medial Cleanser Dakin 16 (oz) 0.25 Discharge Instruction: Use as directed. Peri-Wound Care Topical Vitamin AandD Ointment, 4(oz) tube Discharge Instruction: apply to red, broken down area Primary Dressing Hydrofera Blue Ready Transfer Foam, 2.5x2.5 (in/in) Discharge Instruction: Apply Hydrofera Blue Ready to wound bed as directed Keystone gel Secondary Dressing ABD Pad 5x9 (in/in) Discharge Instruction: Cover with ABD pad Secured With [place] Surgical T ape ape, 2x2 (in/yd) Kerlix Roll Sterile or Non-Sterile 6-ply 4.5x4 (yd/yd) Discharge  Instruction: Apply Kerlix as directed Compression Wrap Compression Stockings Add-Ons Electronic Signature(s) Signed: 10/26/2022 2:03:30 PM By: Massie Kluver Signed: 10/26/2022 4:52:31 PM By: Gretta Cool, BSN, RN, CWS, [name] RN, BSN Entered By: Massie Kluver on 10/26/2022 11:34:54 -------------------------------------------------------------------------------- [place] Details Patient Name: Date of Service: [name], Tynisa [name]. 10/26/2022 11:00 [name]: [id number] Patient Account Number: [id number] Date of Birth/Sex: Treating RN: Jul 02, 1976 (47 y.o. [name], [name] Primary Care Leiani Enright: Raelene Bott Other Clinician: Massie Kluver Referring Ajla Mcgeachy: Treating Elvan Ebron/Extender: Eddie North in Treatment: 32 Vital Signs Time Taken: 11:19 Temperature (F): 97.7 Height (in): 69 Pulse (bpm): 63 Weight (lbs): 178 Respiratory Rate (breaths/min): 16 Body Mass Index (BMI): 26.3 Blood Pressure (mmHg): 130/81 Reference Range: 80 - 120 mg / dl Electronic Signature(s) Signed: 10/26/2022 2:03:30 PM By: Massie Kluver Entered By: Massie Kluver on 10/26/2022 11:22:03

## 2022-11-01 NOTE — Progress Notes (Signed)
[name] ([id number]) [id number] Page 1 of 19 Visit Report for 10/19/2022 Cellular or Tissue Based Product Details Patient Name: Date of Service: [name], [name] 10/19/2022 9:45 [name] Medical Record Number: [id number] Patient Account Number: [id number] Date of Birth/Sex: Treating RN: 1975-11-13 (47 y.o. Orvan Falconer Primary Care Provider: Raelene Bott Other Clinician: Massie Kluver Referring Provider: Treating Provider/Extender: Eldridge Dace, MICHA EL Baxter Kail in Treatment: 31 Cellular or Tissue Based Product Type Wound #12 Left,Medial,Plantar Foot Applied to: Performed By: Physician Ricard [name], MD Cellular or Tissue Based Product Type: Apligraf Level of Consciousness (Pre-procedure): Awake and Alert Pre-procedure Verification/Time Out Yes - 10:38 Taken: Location: genitalia / hands / feet / multiple digits Wound Size (sq cm): 0.77 Product Size (sq cm): 44 Waste Size (sq cm): 22 Waste Reason: wound size Amount of Product Applied (sq cm): 22 Instrument Used: Blade, Scissors Lot #: [id number] Order #: 1 Expiration Date: 10/25/2022 Fenestrated: Yes Instrument: Blade Reconstituted: Yes Solution Type: normal saline Solution Amount: 12mls Lot #: [id number] Solution Expiration Date: 01/18/2024 Secured: Yes Secured With: Steri-Strips Dressing Applied: Yes Primary Dressing: bolster Response to Treatment: Procedure was tolerated well Level of Consciousness (Post- Awake and Alert procedure): Post Procedure Diagnosis Same as Pre-procedure Electronic Signature(s) Signed: 11/01/2022 10:27:37 AM By: Massie Kluver Entered By: Massie Kluver on 10/19/2022 10:44:47 [name] ([id number]) [id number] Page 2 of 19 -------------------------------------------------------------------------------- Debridement Details Patient Name: Date of Service: [name], [name] 10/19/2022 9:45 [name] Medical Record  Number: [id number] Patient Account Number: [id number] Date of Birth/Sex: Treating RN: Aug 22, 1976 (47 y.o. [name], [place] Primary Care Provider: Raelene Bott Other Clinician: Massie Kluver Referring Provider: Treating Provider/Extender: Eldridge Dace, MICHA EL [name], [name] in Treatment: 31 Debridement Performed for Assessment: Wound #12 Left,Medial,Plantar Foot Performed By: Physician Ricard [name], MD Debridement Type: Debridement Severity of Tissue Pre Debridement: Fat layer exposed Level of Consciousness (Pre-procedure): Awake and Alert Pre-procedure Verification/Time Out Yes - 10:34 Taken: Start Time: 10:34 T Area Debrided (L x W): otal 1.1 (cm) x 0.7 (cm) = 0.77 (cm) Tissue and other material debrided: Viable, Non-Viable, Slough, Slough Level: Non-Viable Tissue Debridement Description: Selective/Open Wound Instrument: Curette Bleeding: Minimum Hemostasis Achieved: Pressure Response to Treatment: Procedure was tolerated well Level of Consciousness (Post- Awake and Alert procedure): Post Debridement Measurements of Total Wound Length: (cm) 1.1 Width: (cm) 0.7 Depth: (cm) 0.1 Volume: (cm) 0.06 Character of Wound/Ulcer Post Debridement: Stable Severity of Tissue Post Debridement: Fat layer exposed Post Procedure Diagnosis Same as Pre-procedure Electronic Signature(s) Signed: 10/19/2022 4:44:55 PM By: Linton Ham MD Signed: 10/20/2022 9:37:21 AM By: Carlene Coria RN Signed: 11/01/2022 10:27:37 AM By: Massie Kluver Entered By: Massie Kluver on 10/19/2022 10:36:21 -------------------------------------------------------------------------------- HPI Details Patient Name: Date of Service: Heather Hitch [name]. 10/19/2022 9:45 [name] Medical Record Number: [id number] Patient Account Number: [id number] Date of Birth/Sex: Treating RN: 1975-12-14 (47 y.o. Orvan Falconer Primary Care Provider: Raelene Bott Other Clinician: Libia, Fazzini [name] ([id number])  [id number] Page 3 of 19 Referring Provider: Treating Provider/Extender: RO BSO N, MICHA EL Marcelline Deist [name] in Treatment: 31 History of Present Illness HPI Description: 01/18/18-She is here for initial evaluation of the left great toe ulcer. She is [name] poor historian in regards to timeframe in detail. She states approximately 4 [name] ago she lacerated her toe on something in the house. She followed up with her primary care who placed her on Bactrim and ultimately [name] second dose of  Bactrim prior to coming to wound clinic. She states she has been treating the toe with peroxide, Betadine and [name] Band-Aid. She did not check her blood sugar this morning but checked it yesterday morning it was 327; she is unaware of [name] recent A1c and there are no current records. She saw Dr. she would've orthopedics last week for an old injury to the left ankle, she states he did not see her toe, nor did she bring it to his attention. She smokes approximately 1 pack cigarettes [name] day. Her social situation is concerning, she arrives this morning with her mother who appears extremely intoxicated/under the influence; her mother was asked to leave the room and be monitored by the patient's grandmother. The patient's aunt then accompanied the patient and the room throughout the rest of the appointment. We had [name] lengthy discussion regarding the deleterious effects of uncontrolled hyperglycemia and smoking as it relates to wound healing and overall health. She was strongly encouraged to decrease her smoking and get her diabetes under better control. She states she is currently on [name] diet and has cut down her [facility] consumption. The left toe is erythematous, macerated and slightly edematous with malodor present. The edema in her left foot is below her baseline, there is no erythema streaking. We will treat her with Santyl, doxycycline; we have ordered and xray, culture and provided [name] Peg assist  surgical shoe and cultured the wound. 01/25/18-She is here in follow-up evaluation for [name] left great toe ulcer and presents with an abscess to her suprapubic area. She states her blood sugars remain elevated, feeling "sick" and if levels are below 250, but she is trying. She has made no attempt to decrease her smoking stating that we "can't take away her food in her cigarettes". She has been compliant with offloading using the PEG assist you. She is using Santyl daily. the culture obtained last week grew staph aureus and Enterococcus faecalis; continues on the doxycycline and Augmentin was added on Monday. The suprapubic area has erythema, no femoral variation, purple discoloration, minimal induration, was accessed with [name] cotton tip applicator with sanguinopurulent drainage, this was cultured, I suspect the current antibiotic treatment will cover and we will not add anything to her current treatment plan. She was advised to go to urgent care or ER with any change in redness, induration or fever. 02/01/18-She is here in follow-up evaluation for left great toe ulcers and [name] new abdominal abscess from last week. She was able to use packing until earlier this week, where she "forgot it was there". She states she was feeling ill with GI symptoms last week and was not taking her antibiotic. She states her glucose levels have been predominantly less than 200, with occasional levels between 200-250. She thinks this was contributing to her GI symptoms as they have resolved without intervention. There continues to be significant laceration to left toe, otherwise it clinically looks stable/improved. There is now less superficial opening to the lateral aspect of the great toe that was residual blister. We will transition to [facility] to all wounds, she will continue her Augmentin. If there is no change or deterioration next week for reculture. 02/08/18-She is here in follow-up evaluation for left great toe ulcer  and abdominal ulcer. There is an improvement in both wounds. She has been wrapping her left toe with coban, not by our direction, which has created an area of discoloration to the medial aspect; she has been advised to NOT use coban secondary  to her neuropathy. She states her glucose levels have been high over this last week ranging from 200-350, she continues to smoke. She admits to being less compliant with her offloading shoe. We will continue with same treatment plan and she will follow-up next week. 02/15/18-She is here in follow-up evaluation for left great toe ulcer and abdominal ulcer. The abdominal ulcer is epithelialized. The left great toe ulcer is improved and all injury from last week using the Coban wrap is resolved, the lateral ulcer is healed. She admits to noncompliance with wearing offloading shoe and admits to glucose levels being greater than 300 most of the week. She continues to smoke and expresses no desire to quit. There is one area medially that probes deeper than it has historically, erythema to the toe and dorsal foot has consistently waxed and waned. There is no overt signs of cellulitis or infection but we will culture the wound for any occult infection given the new area of depth and erythema. We will hold off on sensitivities for initiation of antibiotic therapy. 02/22/18-She is here in follow up evaluation for left great toe ulcer. There is overall significant improvement in both wound appearance, erythema and edema with changes made last week. She was not initiated on antibiotic therapy. Culture obtained last week showed oxacillin sensitive staph aureus, sensitive to clindamycin. Clindamycin has been called into the pharmacy but she has been instructed to hold off on initiation secondary to overall clinical improvement and her history of antibiotic intolerance. She has been instructed to contact the clinic with any noted changes/deterioration and the wound, erythema, edema  and/or pain. She will follow-up next week. She continues to smoke and her glucose levels remain elevated >250; she admits to compliance with offloading shoe 03/01/18 on evaluation today patient appears to be doing fairly well in regard to her left first toe ulcer. She has been tolerating the dressing changes with the [facility] Dressing without complication and overall this has definitely showed signs of improvement according to records as well is what the patient tells me today. I'[name] very pleased in that regard. She is having no pain today 03/08/18 She is here for follow up evaluation of [name] left great toe ulcer. She remains non-compliant with glucose control and smoking cessation; glucose levels consistently >200. She states that she got new shoe inserts/peg assist. She admits to compliance with offloading. Since my last evaluation there is significant improvement. We will switch to prisma at this time and she will follow up next week. She is noted to be tachycardic at this appointment, heart rate 120s; she has [name] history of heart rate 70-130 according to our records. She admits to extreme agitation r/t personal issues; she was advised to monitor her heartrate and contact her physician if it does not return to [name] more normal range (<100). She takes cardizem twice daily. 03/15/18-She is here in follow-up evaluation for left great toe ulcer. She remains noncompliant with glucose control and smoking cessation. She admits to compliance with wearing offloading shoe. The ulcer is improved/stable and we will continue with the same treatment plan and she will follow-up next week 03/22/18-She is here for evaluation for left great toe ulcer. There continues to be significant improvement despite recurrent hyperglycemia (over 500 yesterday) and she continues to smoke. She has been compliant with offloading and we will continue with same treatment plan and she will follow-up next week. 03/29/18-She is here for evaluation  for left great toe ulcer. Despite continuing to smoke and  uncontrolled diabetes she continues to improve. She is compliant with offloading shoe. We will continue with the same treatment plan and she will follow-up next week 04/05/18- She is here in follow up evaluation for [name] left great toe ulcer; she presents with small pustule to left fifth toe (resembles ant bite). She admits to compliance with wearing offloading shoe; continues to smoke or have uncontrolled blood glucose control. There is more callus than usual with evidence of bleeding; she denies known trauma. 04/12/18-She is here for evaluation of left great toe ulcer. Despite noncompliance with glycemic control and smoking she continues to make improvement. She continues to wear offloading shoe. The pustule, that was identified last week, to the left fifth toe is resolved. She will follow-up in 2 [name] 05/03/18-she is seen in follow-up evaluation for [name] left great toe ulcer. She is compliant with offloading, otherwise noncompliant with glycemic control and smoking. She has plateaued and there is minimal improvement noted. We will transition to [facility], replaced the insert to her surgical shoe and she will follow-up in one week 05/10/18- She is here in follow up evaluation for [name] left great toe ulcer. It appears stable despite measurement change. We will continue with same treatment plan and follow up next week. 05/24/18-She is seen in follow-up evaluation for [name] left great toe ulcer. She remains compliant with offloading, has made significant improvement in her diet, decreasing the amount of sugar/soda. She said her recent A1c was 10.9 which is lower than. She did see [name] diabetic nutritionist/educator yesterday. She continues to smoke. We will continue with the same treatment plan and she'll follow-up next week. 05/31/18- She is seen in follow-up evaluation for left great toe ulcer. She continues to remain compliant with offloading, continues to  make improvement in her diet, increasing her water and decreasing the amount of sugar/soda. She does continue to smoke with no desire to quit. We will apply Prisma to the depth and Hydrofera Blue over. We have not received insurance authorization for oasis. She will follow up next week. 06/07/18-She is seen in follow-up evaluation for left great toe ulcer. It has stalled according to today's measurements although base appears stable. She says she saw [name] diabetic educator yesterday; her average blood sugars are less than 300 which is an improvement for her. She continues to smoke and states "that's my next step" She continues with water over soda. We will order for xray, culture and reinstate ace wrap compression prior to placing apligraf for next week. She is voicing no complaints or concerns. Her dressing will change to iodoflex over the next week in preparation for apligraf. 06/14/18-She is seen in follow-up evaluation for left great toe ulcer. Plain film x-ray performed last week was negative for osteomyelitis. Wound culture obtained last week grew strep B and OSSA; she is initiated on keflex and cefdinir today; there is erythema to the toe which could be from ace wrap compression, she has [name] history of wrapping too tight and has has been encouraged to maintain ace wraps that we place today. We will hold off on application of apligraf today, will apply next week after antibiotic therapy has been initiated. She admits today that she has resumed taking [name] shower with her foot/toe submerged in water, she has been reminded to keep foot/toe out of the bath water. She will be seen in follow up next week 06/21/18-she is seen in follow-up evaluation for left great toe ulcer. She is tolerating antibiotic therapy with no GI disturbance. The  wound is stable. Apligraf was applied today. She has been decreasing her smoking, only had 4 cigarettes yesterday and 1 today. She continues being more compliant in diabetic  diet. She will follow-up next week for evaluation of site, if stable will remove at 2 [name]. [name], [name] ([id number]) [id number] Page 4 of 19 06/28/18- She is here in follow up evalution. Apligraf was placed last week, she states the dressing fell off on Tuesday and she was dressing with hydrofera blue. She is healed and will be discharged from the clinic today. She has been instructed to continue with smoking cessation, continue monitoring glucose levels, offloading for an additional 4 [name] and continue with hydrofera blue for additional two [name] for any possible microscopic opening. Readmission: 08/07/18 on evaluation today patient presents for reevaluation concerning the ulcer of her right great toe. She was previously discharged on 06/28/18 healed. Nonetheless she states that this began to show signs of drainage she subsequently went to her primary care provider. Subsequently an x-ray was performed on 08/01/18 which was negative. The patient was also placed on antibiotics at that time. Fortunately they should have been effective for the infection. Nonetheless she's been experiencing some improvement but still has [name] lot of drainage coming from the wound itself. 08/14/18 on evaluation today patient's wound actually does show signs of improvement in regard to the erythema at this point. She has completed the antibiotics. With that being said we did discuss the possibility of placing her in [name] total contact cast as of today although I think that I may want to give this just [name] little bit more time to ensure nothing recurrence as far as her infection is concerned. I do not want to put in the cast and risk infection at that time if things are not completely resolved. With that being said she is gonna require some debridement today. 08/21/18 on evaluation today patient actually appears to be doing okay in regard to her toe ulcer. She's been tolerating the dressing changes  without complication. With that being said it does appear that she is ready and in fact I think it's appropriate for [place] to go ahead and initiate the total contact cast today. Nonetheless she will require some sharp debridement to prepare the wound for application. Overall I feel like things have been progressing well but we do need to do something to get this to close more readily. 08/24/18 patient seen today for reevaluation after having had the total contact cast applied on Tuesday. She seems to have done very well the wound appears to be doing great and overall I'[name] pleased with the progress that she's made. There were no abnormal areas of rubbing from the cast on her lower extremity. 08/30/18 on evaluation today patient actually appears to be completely healed in regard to her plantar toe ulcer. She tells me at this point she's been having [name] lot of issues with the cast. She almost fell [name] couple of times the state shall the step of her dog [name] couple times as well. This is been [name] very frustrating process for her other nonetheless she has completely healed the wound which is excellent news. Overall there does not appear to be the evidence of infection at this time which is great news. 09/11/18 evaluation today patient presents for follow-up concerning her great toe ulcer on the left which has unfortunately reopened since I last saw her which was only [name] couple of [name] ago. Unfortunately she was not able to get in  to get the shoe and potentially the AFO that's gonna be necessary due to her left foot drop. She continues with offloading shoe but this is not enough to prevent her from reopening it appears. When we last had her in the total contact cast she did well from [name] healing standpoint but unfortunately the wound reopened as soon as she came out of the cast within just [name] couple of [name]. 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 This is also part of the reason why she falls on [name] regular basis. I just do not believe that is gonna be safe for [place] to reinitiate the total contact cast as last time we had this on she fell 3 times one week which is definitely not normal for her. 09/18/18 upon evaluation today the patient actually appears to be doing about the same in regard to her toe ulcer. She did not contact [employer] as I asked her to even though I had given her the prescription. In fact she actually states that she has no idea where the prescription is. She did apparently call [employer] and they told her that all she needed to do was bring the prescription in order to be able to be seen and work on getting the AFO for her left foot. With all that being said she still does not have an appointment and I'[name] not sure were things stand that regard. I will give her [name] new prescription today in order to contact them to get this set up. 09/25/18 on evaluation today patient actually appears to be doing about the same in regard to her toes ulcer. She does have [name] small areas which seems to have [name] lot of callous buildup around the edge of the wound which is going to need sharp debridement today. She still is waiting to be scheduled for evaluation with [employer] for possibility of an AFO. She states there supposed to call her tomorrow to get this set up. Unfortunately it does appear that her foot specifically the toe area is showing signs of erythema. There does not appear to be any systemic infection which is in these good news. 10/02/18 on evaluation today patient actually appears to be doing about the same in regard to her toe ulcer. This really has not done too well although it's not significantly larger it's also not significantly smaller. She has been tolerating the dressing changes without complication. She actually has her  appointment with [facility] and [facility] tomorrow to hopefully be measured for obtaining and AFO splint. I think this would be helpful preventing this from reoccurring. We had contemplated starting the cast this week although to be honest I am reluctant to do that as she's been having nausea, vomiting, and seizure activity over the past three days. She has [name] history of seizures and have been told is nothing that can be done for these. With that being said I do believe that along with the seizures have the nausea vomiting which upon further questioning doesn't seem to be the normal for her and makes me concerned for the possibility of infection or something else going on. I discussed this with the patient and her mother during the office visit today. I do not feel the wound is effective but maybe something else.  The responses this was "this just happens to her at times and we don't know why". They did not seem to be interested in going to the hospital to have this checked out further. 10/09/18 on evaluation today patient presents for follow-up concerning her ongoing toe ulcer. She has been tolerating the dressing changes without complication. Fortunately there does not appear to be any evidence of infection which is great news however I do think that the patient would benefit from going ahead for with the total contact cast. She's actually in [name] wheelchair today she tells me that she will use her walker if we initiate the cast. I was very specific about the fact that if we were gonna do the cast I wanted to make sure that she was using the walker in order to prevent any falls. She tells me she does not have stairs that she has to traverse on [name] regular basis at her home. She has not had any seizures since last week again that something that happens to her often she tells me she did talk to [employer] and they said that it may take up to three [name] to get the brace approved for her. Hopefully that will not  take that long but nonetheless in the meantime I do think the cast could be of benefit. 10/12/18 on evaluation today patient appears to be doing rather well in regard to her toe ulcer. It's just been [name] few days and already this is significantly improved both as far as overall appearance and size. Fortunately there's no sign of infection. She is here for her first obligatory cast change. 10/19/18 Seen today for follow up and management of left great toe ulcer. Wound continues to show improvement. Noted small open area with seroussang drainage with palpation. Denies any increased pain or recent fevers during visit. She will continue calcium alginate with offloading shoe. Denies any questions or concerns during visit. 10/26/18 on evaluation today patient appears to be doing about the same as when I last saw her in regard to her wound bed. Fortunately there does not appear to be any signs of infection. Unfortunately she continues to have [name] breakdown in regard to the toe region any time that she is not in the cast. It takes almost no time at all for this to happen. Nonetheless she still has not heard anything from the brace being made by [employer] as to when exactly this will be available to her. Fortunately there is no signs of infection at this time. 10/30/18 on evaluation today patient presents for application of the total contact cast as we just received him this morning. Fortunately we are gonna be able to apply this to her today which is great news. She continues to have no significant pain which is good news. Overall I do feel like things have been improving while she was the cast is when she doesn't have [name] cast that things get worse. She still has not really heard anything from [place] regarding her brace. 11/02/18 upon evaluation today patient's wound already appears to be doing significantly better which is good news. Fortunately there does not appear to be any signs of infection also good news. Overall  I do think the total contact cast as before is helping to heal this area unfortunately it's just not gonna likely keep the area closed and healed without her getting her brace at least. Again the foot drop is [name] significant issue for her. 11/09/18 on evaluation today patient appears to be  doing excellent in regard to her toe ulcer which in fact is completely healed. Fortunately we finally got the situation squared away with the paperwork which was needed to proceed with getting her brace approved by Medicaid. I have filled that out unfortunately that information has been sent to the orthopedic office that I worked at 2 1/2 years ago and not tired Current wound care measures. Fortunately she seems to be doing very well at this time. 11/23/18 on evaluation today patient appears to be doing More Poorly Compared to Last Time I Saw Her. At [facility] She Had Completely Healed. Currently [name], [name] ([id number]) [id number] Page 5 of 19 she is continuing to have issues with reopening. She states that she just found out that the brace was approved through Medicaid now she just has to go get measured in order to have this fitted for her and then made. Subsequently she does not have an appointment for this yet that is going to complicate things we obviously cannot put her back in the cast if we do not have everything measured because they're not gonna be able to measure her foot while she is in the cast. 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 Obviously this is unfortunate and does have me somewhat worried as well. 11/30/18 on evaluation today patient's toe ulcer actually appears to be doing fairly well. The good news is she will be getting her brace in the shoes next week on Wednesday. Hopefully we will be able to get this to heal without having to go back in the cast however she  may need the cast in order to get the wound completely heal and then go from there. Fortunately there's no signs of infection at this time. 12/07/18 on evaluation today patient fortunately did receive her brace and she states she could tell this definitely makes her walk better. With that being said she's been having issues with her toe where she noticed yesterday there was [name] lot of tissue that was loosing off this appears to be much larger than what it was previous. She also states that her leg has been read putting much across the top of her foot just about the ankle although this seems to be receiving somewhat. The total area is still red and appears to be someone infected as best I can tell. She is previously taken Bactrim and that may be [name] good option for her today as well. We are gonna see what I wound culture shows as well and I think that this is definitely appropriate. With that being said outside of the culture I still need to initiate something in the interim and that's what I'[name] gonna go ahead and select Bactrim is [name] good option for her. 12/14/18 on evaluation today patient appears to be doing better in regard to her left great toe ulcer as compared to last week's evaluation. There's still some erythema although this is significantly improved which is excellent news. Overall I do believe that she is making good progress is still gonna take some time before she is where I would like her to be from the standpoint of being able to place her back into the total contact cast. Hopefully we will be where we need to be by next week. 12/21/18 on evaluation today patient actually appears to be doing poorly in regard to her toe ulcer. She's been  tolerating the dressing changes without complication. Fortunately there's no signs of systemic infection although she does have [name] lot of drainage from the toe ulcer and this does seem to be causing some issues at this point. She does have erythema on the distal  portion of her toe that appears to be likely cellulitis. 12/28/18 on evaluation today patient actually appears to be doing [name] little better in my pinion in regard to her toe ulcer. With that being said she still does have some evidence of infection at this time and for her culture she had both E. coli as well as enterococcus as organisms noted on evaluation. For that reason I think that though the Keflex likely has treated the E. coli rather well this has really done nothing for the enterococcus. We are going to have to initiate treatment for this specifically. 01/04/19 on evaluation today patient's toe actually appears to be doing better from the standpoint of infection. She currently would like to see about putting the cash back on I think that this is appropriate as long as she takes care of it and keeps it from getting wet. She is gonna have some drainage we can definitely pass this up with Drawtex and alginate to try to prevent as much drainage as possible from causing the problems. With that being said I do want to at least try her with the cast between now and Tuesday. If there any issues we can't continue to use it then I will discontinue the use of the cast at that point. 01/08/19 on evaluation today patient actually appears to be doing very well as far as her foot ulcer specifically the great toe on the left is concerned. She did have an area of rubbing on the medial aspect of her left ankle which again is from the cast. Fortunately there's no signs of infection at this point in this appears to be [name] very slight skin breakdown. The patient tells me she felt it rubbing but didn't think it was that bad. Fortunately there is no signs of active infection at this time which is good news. No fevers, chills, nausea, or vomiting noted at this time. 01/15/19 on evaluation today patient actually appears to be doing well in regard to her toe ulcer. Again as previous she seems to do well and she has the cast  on which indicates to me that during the time she doesn't have [name] cast on she's putting way too much pressure on this region. Obviously I think that's gonna be an issue as with the current national emergency concerning the Covid-19 Virus it has been recommended that we discontinue the use of total contact casting by the chief medical officer of our company, Dr. Simona Huh. The reasoning is that if [name] patient becomes sick and cannot come into have the cast removed they could not just leave this on for an additional two [name]. Obviously the hospitals also do not want to receive patient's who are sick into the emergency department to potentially contaminate the region and spread the Covid-19 Virus among other sick individuals within the hospital system. Therefore at this point we are suspending the use of total contact cast until the current emergency subsides. This was all discussed with the patient today as well. 01/22/19 on evaluation today patient's wound on her left great toe appears to be doing slightly worse than previously noted last week. She tells me that she has been on this quite [name] bit in fact she tells me she's  been awake for 38 straight hours. This is due to the fact that she's having to care for grandparents because nobody else will. She has been taking care of them for five the last seven days since I've seen her they both have dementia his is from [name] stroke and her grandmother's was progressive. Nonetheless she states even her mom who knows her condition and situation has only help two of those days to take care of them she's been taking care of the rest. Fortunately there does not appear to be any signs of active infection in regard to her toe at this point although obviously it doesn't look as good as it did previous. I think this is directly related to her not taking off the pressure and friction by way of taking things easy. Though I completely understand what's going on. 01/29/19 on evaluation  today patient's tools are actually appears to be showing some signs of improvement today compared to last week's evaluation as far as not necessarily the overall size of the wound but the fact that she has some new skin growth in between the two ends of the wound opening. Overall I feel like she has done well she states that she had [name] family member give her what sounds to be [name] CAM walker boot which has been helpful as well. 02/05/19 on evaluation today patient's wound bed actually appears to be doing significantly better in regard to her overall appearance of the size of the wound. With that being said she is still having an issue with offloading efficiently enough to get this to close. Apparently there is some signs of infection at this point as well unfortunately. Previously she's done well of Augmentin I really do not see anything that needs to be culture currently but there theme and cellulitis of the foot that I'[name] seeing I'[name] gonna go ahead and place her on an antibiotic today to try to help clear this up. 02/12/2019 on evaluation today patient actually appears to be doing poorly in regard to her overall wound status. She tells me she has been using her offloading shoe but actually comes in today wearing her tennis shoe with the AFO brace. Again as I previously discussed with her this is really not sufficient to allow the area to heal appropriately. Nonetheless she continues to be somewhat noncompliant and I do wonder based on what she has told my nurse in the past as to whether or not [name] good portion of this noncompliance may be recreational drug and alcohol related. She has had [name] history of heroin overdose and this was fairly recently in the past couple of months that have been seeing her. Nonetheless overall I feel like her wound looks significantly worse today compared to what it was previous. She still has significant erythema despite the Augmentin I am not sure that this is an appropriate  medication for her infection I am also concerned that the infection may have gone down into her bone. 02/19/19 on evaluation today patient actually appears to be doing about the same in regard to her toe ulcer. Unfortunately she continues to show signs of bone exposure and infection at this point. There does not appear to be any evidence of worsening of the infection but I'[name] also not really sure that it's getting significantly better. She is on the Augmentin which should be sufficient for the Staphylococcus aureus infection that she has at this point. With that being said she may need IV antibiotics to more appropriately  treat this. We did have [name] discussion today about hyperbaric option therapy. 02/28/19 on evaluation today patient actually appears to be doing much worse in regard to the wound on her left great toe as compared to even my previous evaluation last week. Unfortunately this seems to be training in [name] pretty poor direction. Her toe was actually now starting to angle laterally and I can actually see the entire joint area of the proximal portion of the digit where is the distal portion of the digit again is no longer even in contact with the joint line. Unfortunately there's [name] lot more necrotic tissue around the edge and the toe appears to be showing signs of becoming gangrenous in my pinion. I'[name] very concerned about were things stand at this point. She did see infectious disease and they are planning to send in [name] prescription for Sivextro for her and apparently this has been approved. With that being said I don't think she should avoid taking this but at the same time I'[name] not sure that it's gonna be sufficient to save her toe at this point. She tells me that she still having to care for grandparents which I think is putting quite [name] bit of strain on her foot and specifically the total area and has caused this to break down even to [name] greater degree than would've otherwise been expected. 03/05/19 on  evaluation today patient actually appears to be doing quite well in regard to her toe all things considering. She still has bone exposed but there appears to be much less your thing on overall the appearance of the wound and the toe itself is dramatically improved. She still does have some issues currently obviously with infection she did see vascular as well and there concerned that her blood flow to the toad. For that reason they are setting up for an angiogram next week. [name], [name] ([id number]) [id number] Page 6 of 19 03/14/19 on evaluation today patient appears to be doing very poor in regard to her toe and specifically in regard to the ulceration and the fact that she's starting to notice the toe was leaning even more towards the lateral aspect and the complete joint is visible on the proximal aspect of the joint. Nonetheless she's also noted [name] significant odor and the tip of the toe is turning more dark and necrotic appearing. Overall I think she is getting worse not better as far as this is concerned. For that reason I am recommending at this point that she likely needs to be seen for likely amputation. READMISSION 03/19/2021 This is [name] patient that we cared for in this clinic for [name] prolonged period of time in 2019 and 2020 with [name] left foot and left first toe wound. 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 Luana Shu. In December 2021 she had an ulcer on her right great toe as well as the fourth and fifth toes. 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 She also had an angiogram at that time and underwent angioplasty of the right anterior tibial artery. In any case she claims that the wound on the right foot is closed I did not look at this today which was probably an oversight although I think that should be done  next week. 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 According to Dr. Deborra Medina last review that she was out of the area being cared for by another physician but recently came back to his attention. 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 [name] culture of this area showed E. coli apparently before she came back to see Dr. Luana Shu she was supposed to be receiving antibiotics but she did not really take them. Nor is she offloading this area at all. Finally her last hemoglobin A1c listed in epic was in March 2022 at 14.1 she says things are [name] lot better since then although I am not sure. 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 She had this wound at the time. During this admission she had E. coli sepsis [name] TEE was negative. 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 She is still [name] heavy cigarette smoker. She has [name] history of bipolar disorder seizure disorder ABI in our clinic was 1.05 6/1; left midfoot in the setting of [name] TMA done previously. Round circular wound with [name] "knuckle" of protruding tissue. The problem is that the knuckle was not attached to any of the surrounding granulation and this probed proximally widely I removed [name] large portion of this tissue. This wound goes with considerable undermining laterally. I do not feel any bone there was no purulence but this is [name] deep wound. 6/8; in spite of the debridement I did last week. She arrives with [name] wound looking exactly the same. [name] protruding "knuckle" of tissue nonadherent to most of the surrounding tissue. There is considerable depth around this from 6-12  o'clock at 2.7 cm and undermining of 1 cm. This does not look overtly infected and the x- ray I did last week was negative for any osseous abnormalities. We have been using silver collagen 6/15; deep tissue culture I did last week showed moderate staph aureus and moderate Pseudomonas. This will definitely require prolonged antibiotic therapy. The pathology on the protuberant area was negative for malignancy fungus etc. 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 We have been using silver collagen. I am going to be prescribing Levaquin for 2 [name][address] scan of the foot is down for 7/5 6/22; CT scan of the foot on 7 5. She says she has hardware in the left leg from her previous fracture. She is on the Levaquin for the deep tissue culture I did that showed methicillin sensitive staph aureus and Pseudomonas. I gave her [name] 2-week supply and she will have another week. She arrives in clinic today with the same protuberant tissue however this is nonadherent to the tissue surrounding it. I am really at [name] loss to explain this unless there is underlying deep tissue infection 6/29; patient presents for 1 week follow-up. She has been using collagen to the wound bed. She reports taking her antibiotics as prescribed.She has no complaints or issues today. She denies signs of infection. 7/6; patient presents for one week followup. She has been using collagen to the wound bed. She states she is taking Levaquin however at times she is not able to keep it down. She denies signs of infection. 7/13; patient presents for 1 week follow-up. She has been using silver alginate  to the wound bed. She still has nausea when taking her antibiotics. She denies signs of infection. 7/20; patient presents for 1 week follow-up. She has been using silver alginate with gentamicin cream to the wound bed. She denies any issues and has no complaints today. She denies signs of  infection. 7/27; patient presents for 1 week follow-up. She continues to use silver alginate with gentamicin cream to the wound bed. She reports starting her antibiotics. She has no issues or complaints. Overall she reports stability to the wound. 8/3; patient presents for 1 week follow-up. She has been using silver alginate with gentamicin cream to the wound bed. She reports completing all antibiotics. She has no issues or complaints today. She denies signs of infection. 8/17; patient presents for 2-week follow-up. He is to use silver alginate to the wound bed. She has no issues or complaints today. She denies signs of infection. She reports her pain has improved in her foot since last clinic visit 8/24; patient presents for 1 week follow-up. She continues to use silver alginate to the wound bed. She has no issues or complaints. She denies signs of infection. Pain is stable. 9/7; patient presents for follow-up. She missed her last week appointment due to feeling ill. She continues to use silver alginate. She has [name] new wound to the right lower extremity that is covered in eschar. She states It occurred over the past week and has no idea how it started. She currently denies signs of infection. 9/14; patient presents for follow-up. T the left foot wound she has been using gentamicin cream and silver alginate. T the right lower extremity wound she has o o been keeping this covered and has not obtain Santyl. 9/21; patient presents for follow-up. She reports using gentamicin cream and silver alginate to the left foot and Santyl to the right lower extremity wound. She has no issues or complaints today. She denies signs of infection. 9/28; patient presents for follow-up. She reports [name] new wound to her right heel. She states this occurred [name] few days ago and is progressively gotten worse. She has been trying to clean the area with [name] Q-tip and Santyl. She reports stability in the other 2 wounds. She has been  using gentamicin cream and silver alginate to the left foot and Santyl to the right lower extremity wound. 10/12; patient presents for follow-up. She reports improvement to the wound beds. She is seeing vein and vascular to discuss the potential of [name] left BKA. She states they are going to do an arteriogram. She continues to use silver alginate with dressing changes to her wounds. 11/2; patient presents for follow-up. She states she has not been doing dressing changes to the wound beds. She states she is not able to offload the areas. She reports chronic pain to her left foot wound. 11/9; patient presents for follow-up. She came in with only socks on. She states she forgot to put on shoes. It is unclear if she is doing any dressing changes. She currently denies systemic signs of infection. 11/16; patient presents for follow-up. She came again only with socks on. She states she does not wear shoes ever. It is unclear if she does dressing changes. She currently denies systemic signs of infection. 11/23; patient presents for follow-up. She wore her shoes today. It still unclear exactly what dressing she is using for each wound but she did states she obtained Dakin's solution and has been using this to the left foot wound. She currently denies  signs of infection. 11/30; patient presents for follow-up. She has no issues or complaints today. She currently denies signs of infection. 12/7; patient presents for follow-up. She has no issues or complaints today. She has been using Hydrofera Blue to the right heel wound and Dakin solution to the left foot wound. Her right anterior leg wound is healed. She currently denies signs of infection. 12/14; patient presents for follow-up. She has been using Hydrofera Blue to the right heel and Dakin's to the left foot wounds. She has no issues or complaints today. She denies signs of infection. [name], [name] ([id number]) [id number] Page 7  of 19 12/21; patient presents for follow-up. She reports using Hydrofera Blue to the right heel and Dakin's to the left foot wound. She denies signs of infection. 12/28; patient presents for follow-up. She continues to use Dakin's to the left foot wound and Hydrofera Blue to the right heel wound. She denies signs of infection. 1/4; patient presents for follow-up. She has no issues or complaints today. She denies signs of infection. 1/11; patient presents for follow-up. It is unclear if she has been dressing these wounds over the past week. She currently denies signs of infection. 1/18; patient presents for follow-up. She states she has been using Dakin's wet-to-dry dressings to the left foot. She has been using Hydrofera Blue to the right foot foot wound. She states that the anterior right leg wound has reopened and draining serous fluid. She denies signs of infection. 1/25; patient presents for follow-up. She has no issues or complaints today. 2/1; patient presents for follow-up. She has no issues or complaints today. She denies signs of infection. 2/8; patient presents for follow-up. She has lost her surgical shoes. She did not have [name] dressing to the right heel wound. She currently denies signs of infection. 2/15; patient presents for follow-up. She reports more pain to the right heel today. She denies purulent drainage Or fever/chills 2/22; patient presents for follow-up. She reports taking clindamycin over the past week. She states that she continues to have pain to her right heel. She reports purulent drainage. Readmission 03/16/2022 Ms. Zahlia Deshazer is [name] 47 year old female with [name] past medical history of type 2 diabetes, osteomyelitis to her feet, chronic systolic heart failure and bipolar disorder that presents to the clinic for bilateral feet wounds and right lower extremity wound. She was last seen in our clinic on 12/15/2021. At that time she had purulent drainage coming out of her right  plantar foot and I recommended she go to the ED. She states she went to [facility] and has been there for the past 3 months. I cannot see the records. She states she had OR debridement and was on several [name] of IV antibiotics while inpatient. Since discharge she has not been taking care of the wound beds. She had nothing on her feet other than socks today. She currently denies signs of infection. 5/31; patient presents for follow-up. She has been using Dakin's wet-to-dry dressings to the wound beds on her feet bilaterally and antibiotic ointment to the right anterior leg wound. She had [name] wound culture done at last clinic visit that showed moderate Pseudomonas aeruginosa sensitive to ciprofloxacin. She currently denies systemic signs of infection. 6/14; patient presents for follow-up. She received Keystone 5 days ago and has been using this on the wound beds. She states that last week she had to go to the hospital because she had increased warmth and erythema to the right foot. She was  started on 2 oral antibiotics. She states she has been taking these. She currently denies systemic signs of infection. She has no issues or complaints today. 6/21; patient presents for follow-up. She states she has been using Keystone antibiotics to the wound beds. She has no issues or complaints today. She denies signs of infection. 6/28; patient presents for follow-up. She has been using Keystone antibiotics to the wound beds. She has no issues or complaints today. 7/12; patient presents for follow-up. Has been using Keystone antibiotics to the wound beds with calcium alginate. She has no issues or complaints today. She never followed up with her orthopedic surgeon who did the OR debridement to the right foot. We discussed the total contact cast for the left foot and patient would like to do this next week. 7/19; patient presents for follow-up. She has been using Keystone antibiotics with calcium  alginate to the wound beds. She has no issues or complaints today. Patient is in agreement to do the total contact cast of the left foot today. She knows to return later this week for the obligatory cast change. 05-13-2022 upon evaluation today patient's wound which she has the cast of the left leg actually appears to be doing significantly better. Fortunately I do not see any signs of active infection locally or systemically which is great news and overall I am extremely pleased with where we stand currently. 7/26; patient presents for follow-up. She has [name] cast in place for the past week. She states it irritated her shin. Other than that she tolerated the cast well. She states she would like [name] break for 1 week from the cast. We have been using Keystone antibiotic and Aquacel to both wound beds. She denies signs of infection. 8/2; patient presents for follow-up. She has been using Keystone and Aquacel to the wound beds. She denies any issues and has no complaints. She is agreeable to have the cast placed today for the left leg. 06-03-2022 upon evaluation today patient appears to be doing well with regard to her wound she saw some signs of improvement which is great news. Fortunately I do not see any evidence of active infection locally or systemically at this time which is great news. No fevers, chills, nausea, vomiting, or diarrhea. 8/16; patient presents for follow-up. She has no issues or complaints today. We have been using Keystone and Aquacel to the wound beds. The left lower extremity is in [name] total contact cast. She is tolerated this well. 8/23; patient presents for follow-up. She has had the total contact cast on the left leg for the past week. Unfortunately this has rubbed and broken down the skin to the medial foot. She currently denies signs of infection. She has been using Keystone antibiotic to the right plantar foot wound. 8/30; patient presents for follow-up. We have held off on the  total contact cast for the left leg for the past week. Her wound on the left foot has improved and the previous surrounding breakdown of skin has epithelialized. She has been using Keystone antibiotic to both wound beds. She has no issues or complaints today. She denies signs of infection. 9/6; patient presents for follow-up. She has ordered her's Keystone antibiotic and this is arriving this week. She has been doing Dakin's wet-to-dry dressings to the wound beds. She denies signs of infection. She is agreeable to the total contact cast today. 9/13; patient presents for follow-up. She states that the cast caused her left leg shin to be sore. She  would like to take [name] break from the cast this week. She has been using Keystone antibiotic to the right plantar foot wound. She denies signs of infection. 9/20; patient presents for follow-up. She has been using Keystone antibiotics to the wound beds with calcium alginate to the right foot wound and Hydrofera Blue to the left foot wound. She is agreeable to having the cast placed today. She has been approved for Apligraf and we will order this for next clinic visit. 9/27; patient presents for follow-up. We have been using Keystone antibiotic with Hydrofera Blue to the left foot wound under [name] total contact cast. T the right o foot wound she has been using Keystone antibiotic and calcium alginate. She declines [name] total contact cast today. Apligraf is available for placement and she would like to proceed with this. 07-28-2022 upon evaluation today patient appears to be doing well currently in regard to her wound. She is actually showing signs of significant improvement which is great news. Fortunately I do not see any evidence of active infection locally nor systemically at this time. She has been seeing Dr. Heber Truesdale and to be honest has been doing very well with the cast. Subsequently she comes in today with [name] cast on and we did reapply that today as well. She did  not really want to she try to talk me out of that but I explained that if she wanted to heal this is really the right way to go. Patient voiced understanding. In regard to her [name], [name] ([id number]) [id number] Page 8 of 19 right foot this is actually [name] lot better compared to the last time I saw her which is also great news. 10/11; patient presents for follow-up. Apligraf and the total contact cast was placed to the left leg at last clinic visit. She states that her right foot wound had burning pain to it with the placement of Apligraf to this area. She has been doing [place] over this area. She denies signs of infection including increased warmth, erythema or purulent drainage. 11/1; 3-week follow-up. The patient fortunately did not have [name] total contact cast or an Apligraf and on the left foot. She has been using Keystone ABD pads and kerlix and her own running shoes She arrives in clinic today with thick callus and [name] very poor surface on the left foot on the right nonviable skin subcutaneous tissue and [name] deep probing hole. 11/15; patient missed her last clinic appointment. She states she has not been dressing the wound beds for the past 2 [name]. She states that at she had [name] new roommate but is now going back to live with her mother. Apparently its been [name] distracting 2 [name]. Patient currently denies signs of infection. 11/22; patient presents for follow-up. She states she has been using Keystone antibiotic and Dakin's wet-to-dry dressings to the wound beds. She is agreeable for cast placement today. We had ordered Apligraf however this has not been received by our facility. 11/29; Patient had [name] total contact cast placed at last clinic visit and she tolerated this well. We were using silver alginate under the cast. Patient's been using Keystone antibiotic with Aquacel to the right plantar foot wound. She has no issues or complaints today. Apligraf is available for  placement today. Patient would like to proceed with this. 12/6; patient presents for follow-up. She had Apligraf placed in standard fashion last clinic visit under the total contact cast to the left lower extremity. She has been using Keystone antibiotic  and Aquacel to the right plantar foot wound. She has no issues or complaints today. 12/13; patient presents for follow-up. She has finished 5 Apligraf placements. Was told she would not qualify for more. We have been doing [name] total contact cast to the left lower extremity. She has been using Keystone antibiotic and Aquacel to the right plantar foot wound. She has no issues or complaints today. 12/20; patient presents for follow-up. We have been using Hydrofera Blue with Keystone antibiotic under [name] total contact cast of the left lower extremity. She reports using Keystone antibiotic and silver alginate to the right heel wound. She has no issues or complaints today. 12/27; patient presents with [name] healthy wound on the left midfoot. We have Apligraf to apply that to that more also using [name] total contact cast. On the right we are using Keystone and silver alginate. She is offloading the right heel with [name] surgical shoealthough by her admission she is on her feet quite [name] bit Electronic Signature(s) Signed: 10/19/2022 4:44:55 PM By: Linton Ham MD Entered By: Linton Ham on 10/19/2022 11:19:47 -------------------------------------------------------------------------------- Physical Exam Details Patient Name: Date of Service: Heather Hitch [name]. 10/19/2022 9:45 [name] Medical Record Number: [id number] Patient Account Number: [id number] Date of Birth/Sex: Treating RN: Nov 03, 1975 (47 y.o. Orvan Falconer Primary Care Provider: Raelene Bott Other Clinician: Massie Kluver Referring Provider: Treating Provider/Extender: RO BSO N, MICHA EL [name], [name] in Treatment: 31 Constitutional Sitting or standing Blood Pressure is within target range  for patient.. Pulse regular and within target range for patient.Marland Kitchen Respirations regular, non-labored and within target range.. Temperature is normal and within the target range for the patient.Marland Kitchen appears in no distress. Notes Wound exam; right foot the plantar heel apparently an incision site. This has some depth with [name] divot. I could not feel or appreciate any bone. No purulence and no erythema around the wound On the left foot mid medial aspect. Oval-shaped wound that looks improved I removed some adherent slough and reapplied Apligraf in the standard fashion Electronic Signature(s) Signed: 10/19/2022 4:44:55 PM By: Linton Ham MD Entered By: Linton Ham on 10/19/2022 11:20:55 [name] ([id number]) [id number] Page 9 of 19 -------------------------------------------------------------------------------- Physician Orders Details Patient Name: Date of Service: COREAN, YOSHIMURA 10/19/2022 9:45 [name] Medical Record Number: [id number] Patient Account Number: [id number] Date of Birth/Sex: Treating RN: Feb 11, 1976 (47 y.o. Orvan Falconer Primary Care Provider: Raelene Bott Other Clinician: Massie Kluver Referring Provider: Treating Provider/Extender: RO BSO N, MICHA EL [name], [name] in Treatment: 31 Verbal / Phone Orders: No Diagnosis Coding Follow-up Appointments Return Appointment in 1 week. Nurse Visit as needed Bathing/ Shower/ Hygiene May shower with wound dressing protected with water repellent cover or cast protector. No tub bath. Anesthetic (Use 'Patient Medications' Section for Anesthetic Order Entry) Lidocaine applied to wound bed Cellular or Tissue Based Products Cellular or Tissue Based Product Type: - Apligraf # 1 Edema Control - Lymphedema / Segmental Compressive Device / Other Elevate, Exercise Daily and [name] void Standing for Long Periods of Time. Elevate legs to the level of the heart and pump ankles as often as  possible Elevate leg(s) parallel to the floor when sitting. Off-Loading Total Contact Cast to Left Lower Extremity Total Contact Cast to Right Lower Extremity - applied to left foot Open toe surgical shoe - Heel off loader - Right foot Other: - keep pressure off of feet. Medications-Please add to medication list. Wound #12 Left,Medial,Plantar Foot Keystone Compound - Keystone bilat  feet Wound Treatment Wound #11 - Foot Wound Laterality: Plantar, Right Cleanser: Dakin 16 (oz) 0.25 1 x Per Day/30 Days Discharge Instructions: Use as directed. Cleanser: Soap and Water 1 x Per Day/30 Days Discharge Instructions: Gently cleanse wound with antibacterial soap, rinse and pat dry prior to dressing wounds Topical: keystone gel 1 x Per Day/30 Days Prim Dressing: Aquacel Extra Hydrofiber Dressing, 2x2 (in/in) ary 1 x Per Day/30 Days Secondary Dressing: ABD Pad 5x9 (in/in) (Generic) 1 x Per Day/30 Days Discharge Instructions: Cover with ABD pad Secured With: Medipore T - 25M Medipore H Soft Cloth Surgical T ape ape, 2x2 (in/yd) (Generic) 1 x Per Day/30 Days Secured With: Kerlix Roll Sterile or Non-Sterile 6-ply 4.5x4 (yd/yd) (Generic) 1 x Per Day/30 Days Discharge Instructions: Apply Kerlix as directed Wound #12 - Foot Wound Laterality: Plantar, Left, Medial Cleanser: Dakin 16 (oz) 0.25 (Dispense As Written) 1 x Per Day/30 Days Discharge Instructions: Use as directed. [name], [name] ([id number]) [id number] Page 10 of 19 Topical: Vitamin AandD Ointment, 4(oz) tube 1 x Per Day/30 Days Discharge Instructions: apply to red, broken down area Prim Dressing: Hydrofera Blue Ready Transfer Foam, 2.5x2.5 (in/in) 1 x Per Day/30 Days ary Discharge Instructions: Apply Hydrofera Blue Ready to wound bed as directed Prim Dressing: Keystone gel ary 1 x Per Day/30 Days Secondary Dressing: ABD Pad 5x9 (in/in) (Generic) 1 x Per Day/30 Days Discharge Instructions: Cover with ABD  pad Secured With: Medipore T - 25M Medipore H Soft Cloth Surgical T ape ape, 2x2 (in/yd) (Generic) 1 x Per Day/30 Days Secured With: Kerlix Roll Sterile or Non-Sterile 6-ply 4.5x4 (yd/yd) (Generic) 1 x Per Day/30 Days Discharge Instructions: Apply Kerlix as directed Electronic Signature(s) Signed: 10/19/2022 4:44:55 PM By: Linton Ham MD Signed: 11/01/2022 10:27:37 AM By: Massie Kluver Entered By: Massie Kluver on 10/19/2022 11:07:02 -------------------------------------------------------------------------------- Problem List Details Patient Name: Date of Service: Heather Hitch [name]. 10/19/2022 9:45 [name] Medical Record Number: [id number] Patient Account Number: [id number] Date of Birth/Sex: Treating RN: 1976-07-27 (47 y.o. Orvan Falconer Primary Care Provider: Raelene Bott Other Clinician: Massie Kluver Referring Provider: Treating Provider/Extender: Eldridge Dace, MICHA EL [name], [name] in Treatment: 31 Active Problems ICD-10 Encounter Code Description 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 03/16/2022 No Yes E11.621 Type 2 diabetes mellitus with foot ulcer 03/16/2022 No Yes E11.42 Type 2 diabetes mellitus with diabetic polyneuropathy 03/16/2022 No Yes L97.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 03/16/2022 No Yes of skin Inactive Problems Kurowski, Laterra [name] ([id number]) [id number] Page 11 of 19 Resolved Problems Electronic Signature(s) Signed: 10/19/2022 4:44:55 PM By: Linton Ham MD Entered By: Linton Ham on 10/19/2022 11:18:29 -------------------------------------------------------------------------------- Progress Note Details Patient Name: Date of Service: Heather Hitch [name]. 10/19/2022 9:45 [name] Medical Record  Number: [id number] Patient Account Number: [id number] Date of Birth/Sex: Treating RN: [birth year] (47 y.o. Orvan Falconer Primary Care Provider: Raelene Bott Other Clinician: Massie Kluver Referring Provider: Treating Provider/Extender: RO BSO N, MICHA EL [name], [name] in Treatment: 31 Subjective History of Present Illness (HPI) 01/18/18-She is here for initial evaluation of the left great toe ulcer. She is [name] poor historian in regards to timeframe in detail. She states approximately 4 [name] ago she lacerated her toe on something in the house. She followed up with her  primary care who placed her on Bactrim and ultimately [name] second dose of Bactrim prior to coming to wound clinic. She states she has been treating the toe with peroxide, Betadine and [name] Band-Aid. She did not check her blood sugar this morning but checked it yesterday morning it was 327; she is unaware of [name] recent A1c and there are no current records. She saw Dr. she would've orthopedics last week for an old injury to the left ankle, she states he did not see her toe, nor did she bring it to his attention. She smokes approximately 1 pack cigarettes [name] day. Her social situation is concerning, she arrives this morning with her mother who appears extremely intoxicated/under the influence; her mother was asked to leave the room and be monitored by the patient's grandmother. The patient's aunt then accompanied the patient and the room throughout the rest of the appointment. We had [name] lengthy discussion regarding the deleterious effects of uncontrolled hyperglycemia and smoking as it relates to wound healing and overall health. She was strongly encouraged to decrease her smoking and get her diabetes under better control. She states she is currently on [name] diet and has cut down her [facility] consumption. The left toe is erythematous, macerated and slightly edematous with malodor present. The edema in her left foot is below her  baseline, there is no erythema streaking. We will treat her with Santyl, doxycycline; we have ordered and xray, culture and provided [name] Peg assist surgical shoe and cultured the wound. 01/25/18-She is here in follow-up evaluation for [name] left great toe ulcer and presents with an abscess to her suprapubic area. She states her blood sugars remain elevated, feeling "sick" and if levels are below 250, but she is trying. She has made no attempt to decrease her smoking stating that we "can't take away her food in her cigarettes". She has been compliant with offloading using the PEG assist you. She is using Santyl daily. the culture obtained last week grew staph aureus and Enterococcus faecalis; continues on the doxycycline and Augmentin was added on Monday. The suprapubic area has erythema, no femoral variation, purple discoloration, minimal induration, was accessed with [name] cotton tip applicator with sanguinopurulent drainage, this was cultured, I suspect the current antibiotic treatment will cover and we will not add anything to her current treatment plan. She was advised to go to urgent care or ER with any change in redness, induration or fever. 02/01/18-She is here in follow-up evaluation for left great toe ulcers and [name] new abdominal abscess from last week. She was able to use packing until earlier this week, where she "forgot it was there". She states she was feeling ill with GI symptoms last week and was not taking her antibiotic. She states her glucose levels have been predominantly less than 200, with occasional levels between 200-250. She thinks this was contributing to her GI symptoms as they have resolved without intervention. There continues to be significant laceration to left toe, otherwise it clinically looks stable/improved. There is now less superficial opening to the lateral aspect of the great toe that was residual blister. We will transition to [facility] to all wounds, she will continue her  Augmentin. If there is no change or deterioration next week for reculture. 02/08/18-She is here in follow-up evaluation for left great toe ulcer and abdominal ulcer. There is an improvement in both wounds. She has been wrapping her left toe with coban, not by our direction, which has created an area of discoloration  to the medial aspect; she has been advised to NOT use coban secondary to her neuropathy. She states her glucose levels have been high over this last week ranging from 200-350, she continues to smoke. She admits to being less compliant with her offloading shoe. We will continue with same treatment plan and she will follow-up next week. 02/15/18-She is here in follow-up evaluation for left great toe ulcer and abdominal ulcer. The abdominal ulcer is epithelialized. The left great toe ulcer is improved and all injury from last week using the Coban wrap is resolved, the lateral ulcer is healed. She admits to noncompliance with wearing offloading shoe and admits to glucose levels being greater than 300 most of the week. She continues to smoke and expresses no desire to quit. There is one area medially that probes deeper than it has historically, erythema to the toe and dorsal foot has consistently waxed and waned. There is no overt signs of cellulitis or infection but we will culture the wound for any occult infection given the new area of depth and erythema. We will hold off on sensitivities for initiation of antibiotic therapy. 02/22/18-She is here in follow up evaluation for left great toe ulcer. There is overall significant improvement in both wound appearance, erythema and edema with changes made last week. She was not initiated on antibiotic therapy. Culture obtained last week showed oxacillin sensitive staph aureus, sensitive to clindamycin. Clindamycin has been called into the pharmacy but she has been instructed to hold off on initiation secondary to overall clinical improvement and her  history of antibiotic intolerance. She has been instructed to contact the clinic with any noted changes/deterioration and the wound, erythema, edema and/or pain. She will follow-up next week. She continues to smoke and her glucose levels remain elevated >250; she admits to compliance with offloading shoe 03/01/18 on evaluation today patient appears to be doing fairly well in regard to her left first toe ulcer. She has been tolerating the dressing changes with the [facility] Dressing without complication and overall this has definitely showed signs of improvement according to records as well is what the patient tells me today. I'[name] very pleased in that regard. She is having no pain today 03/08/18 She is here for follow up evaluation of [name] left great toe ulcer. She remains non-compliant with glucose control and smoking cessation; glucose levels consistently >200. She states that she got new shoe inserts/peg assist. She admits to compliance with offloading. Since my last evaluation there is significant improvement. We will switch to prisma at this time and she will follow up next week. She is noted to be tachycardic at this appointment, heart rate 120s; she has [name] history of heart rate 70-130 according to our records. She admits to extreme agitation r/t personal issues; she was advised to monitor her heartrate and contact her physician if it does not return to [name] more normal range (<100). She takes cardizem twice daily. 03/15/18-She is here in follow-up evaluation for left great toe ulcer. She remains noncompliant with glucose control and smoking cessation. She admits to compliance with wearing offloading shoe. The ulcer is improved/stable and we will continue with the same treatment plan and she will follow-up next week 03/22/18-She is here for evaluation for left great toe ulcer. There continues to be significant improvement despite recurrent hyperglycemia (over 500 yesterday) CAYENNE, BREAULT [name] ([id number])  [id number] Page 12 of 19 and she continues to smoke. She has been compliant with offloading and we will continue with same treatment  plan and she will follow-up next week. 03/29/18-She is here for evaluation for left great toe ulcer. Despite continuing to smoke and uncontrolled diabetes she continues to improve. She is compliant with offloading shoe. We will continue with the same treatment plan and she will follow-up next week 04/05/18- She is here in follow up evaluation for [name] left great toe ulcer; she presents with small pustule to left fifth toe (resembles ant bite). She admits to compliance with wearing offloading shoe; continues to smoke or have uncontrolled blood glucose control. There is more callus than usual with evidence of bleeding; she denies known trauma. 04/12/18-She is here for evaluation of left great toe ulcer. Despite noncompliance with glycemic control and smoking she continues to make improvement. She continues to wear offloading shoe. The pustule, that was identified last week, to the left fifth toe is resolved. She will follow-up in 2 [name] 05/03/18-she is seen in follow-up evaluation for [name] left great toe ulcer. She is compliant with offloading, otherwise noncompliant with glycemic control and smoking. She has plateaued and there is minimal improvement noted. We will transition to [facility], replaced the insert to her surgical shoe and she will follow-up in one week 05/10/18- She is here in follow up evaluation for [name] left great toe ulcer. It appears stable despite measurement change. We will continue with same treatment plan and follow up next week. 05/24/18-She is seen in follow-up evaluation for [name] left great toe ulcer. She remains compliant with offloading, has made significant improvement in her diet, decreasing the amount of sugar/soda. She said her recent A1c was 10.9 which is lower than. She did see [name] diabetic nutritionist/educator yesterday.  She continues to smoke. We will continue with the same treatment plan and she'll follow-up next week. 05/31/18- She is seen in follow-up evaluation for left great toe ulcer. She continues to remain compliant with offloading, continues to make improvement in her diet, increasing her water and decreasing the amount of sugar/soda. She does continue to smoke with no desire to quit. We will apply Prisma to the depth and Hydrofera Blue over. We have not received insurance authorization for oasis. She will follow up next week. 06/07/18-She is seen in follow-up evaluation for left great toe ulcer. It has stalled according to today's measurements although base appears stable. She says she saw [name] diabetic educator yesterday; her average blood sugars are less than 300 which is an improvement for her. She continues to smoke and states "that's my next step" She continues with water over soda. We will order for xray, culture and reinstate ace wrap compression prior to placing apligraf for next week. She is voicing no complaints or concerns. Her dressing will change to iodoflex over the next week in preparation for apligraf. 06/14/18-She is seen in follow-up evaluation for left great toe ulcer. Plain film x-ray performed last week was negative for osteomyelitis. Wound culture obtained last week grew strep B and OSSA; she is initiated on keflex and cefdinir today; there is erythema to the toe which could be from ace wrap compression, she has [name] history of wrapping too tight and has has been encouraged to maintain ace wraps that we place today. We will hold off on application of apligraf today, will apply next week after antibiotic therapy has been initiated. She admits today that she has resumed taking [name] shower with her foot/toe submerged in water, she has been reminded to keep foot/toe out of the bath water. She will be seen in follow up next  week 06/21/18-she is seen in follow-up evaluation for left great toe ulcer. She is  tolerating antibiotic therapy with no GI disturbance. The wound is stable. Apligraf was applied today. She has been decreasing her smoking, only had 4 cigarettes yesterday and 1 today. She continues being more compliant in diabetic diet. She will follow-up next week for evaluation of site, if stable will remove at 2 [name]. 06/28/18- She is here in follow up evalution. Apligraf was placed last week, she states the dressing fell off on Tuesday and she was dressing with hydrofera blue. She is healed and will be discharged from the clinic today. She has been instructed to continue with smoking cessation, continue monitoring glucose levels, offloading for an additional 4 [name] and continue with hydrofera blue for additional two [name] for any possible microscopic opening. Readmission: 08/07/18 on evaluation today patient presents for reevaluation concerning the ulcer of her right great toe. She was previously discharged on 06/28/18 healed. Nonetheless she states that this began to show signs of drainage she subsequently went to her primary care provider. Subsequently an x-ray was performed on 08/01/18 which was negative. The patient was also placed on antibiotics at that time. Fortunately they should have been effective for the infection. Nonetheless she's been experiencing some improvement but still has [name] lot of drainage coming from the wound itself. 08/14/18 on evaluation today patient's wound actually does show signs of improvement in regard to the erythema at this point. She has completed the antibiotics. With that being said we did discuss the possibility of placing her in [name] total contact cast as of today although I think that I may want to give this just [name] little bit more time to ensure nothing recurrence as far as her infection is concerned. I do not want to put in the cast and risk infection at that time if things are not completely resolved. With that being said she is gonna require some debridement  today. 08/21/18 on evaluation today patient actually appears to be doing okay in regard to her toe ulcer. She's been tolerating the dressing changes without complication. With that being said it does appear that she is ready and in fact I think it's appropriate for [place] to go ahead and initiate the total contact cast today. Nonetheless she will require some sharp debridement to prepare the wound for application. Overall I feel like things have been progressing well but we do need to do something to get this to close more readily. 08/24/18 patient seen today for reevaluation after having had the total contact cast applied on Tuesday. She seems to have done very well the wound appears to be doing great and overall I'[name] pleased with the progress that she's made. There were no abnormal areas of rubbing from the cast on her lower extremity. 08/30/18 on evaluation today patient actually appears to be completely healed in regard to her plantar toe ulcer. She tells me at this point she's been having [name] lot of issues with the cast. She almost fell [name] couple of times the state shall the step of her dog [name] couple times as well. This is been [name] very frustrating process for her other nonetheless she has completely healed the wound which is excellent news. Overall there does not appear to be the evidence of infection at this time which is great news. 09/11/18 evaluation today patient presents for follow-up concerning her great toe ulcer on the left which has unfortunately reopened since I last saw her which was only  [name] couple of [name] ago. Unfortunately she was not able to get in to get the shoe and potentially the AFO that's gonna be necessary due to her left foot drop. She continues with offloading shoe but this is not enough to prevent her from reopening it appears. When we last had her in the total contact cast she did well from [name] healing standpoint but unfortunately the wound reopened as soon as she came out of the cast  within just [name] couple of [name]. 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 This is also part of the reason why she falls on [name] regular basis. I just do not believe that is gonna be safe for [place] to reinitiate the total contact cast as last time we had this on she fell 3 times one week which is definitely not normal for her. 09/18/18 upon evaluation today the patient actually appears to be doing about the same in regard to her toe ulcer. She did not contact [facility] as I asked her to even though I had given her the prescription. In fact she actually states that she has no idea where the prescription is. She did apparently call [facility] and they told her that all she needed to do was bring the prescription in order to be able to be seen and work on getting the AFO for her left foot. With all that being said she still does not have an appointment and I'[name] not sure were things stand that regard. I will give her [name] new prescription today in order to contact them to get this set up. 09/25/18 on evaluation today patient actually appears to be doing about the same in regard to her toes ulcer. She does have [name] small areas which seems to have [name] lot of callous buildup around the edge of the wound which is going to need sharp debridement today. She still is waiting to be scheduled for evaluation with [facility] for possibility of an AFO. She states there supposed to call her tomorrow to get this set up. Unfortunately it does appear that her foot specifically the toe area is showing signs of erythema. There does not appear to be any systemic infection which is in these good news. 10/02/18 on evaluation today patient actually appears to be doing about the same in regard to her toe ulcer. This really has not done too well although it's  not significantly larger it's also not significantly smaller. She has been tolerating the dressing changes without complication. She actually has her appointment with [facility] and [facility] tomorrow to hopefully be measured for obtaining and AFO splint. I think this would be helpful preventing this from reoccurring. We had contemplated starting the cast this week although to be honest I am reluctant to do that as she's been having nausea, vomiting, and seizure activity over the past three days. She has [name] history of seizures and have been told is nothing that can be done for these. With that being said I do believe that along with the seizures have the nausea vomiting which upon further questioning doesn't seem to be the normal for her and makes me concerned for the possibility of infection or something else going on. I discussed this with the patient and her mother during the office visit  today. I do not feel the wound is effective but maybe something else. The responses this was "this just happens to her at times and we don't know why". They did not seem to be interested in going to the hospital to have this checked out further. 10/09/18 on evaluation today patient presents for follow-up concerning her ongoing toe ulcer. She has been tolerating the dressing changes without complication. Fortunately there does not appear to be any evidence of infection which is great news however I do think that the patient would benefit from [name], [name] ([id number]) [id number] Page 13 of 19 going ahead for with the total contact cast. She's actually in [name] wheelchair today she tells me that she will use her walker if we initiate the cast. I was very specific about the fact that if we were gonna do the cast I wanted to make sure that she was using the walker in order to prevent any falls. She tells me she does not have stairs that she has to traverse on [name] regular basis at her home. She has  not had any seizures since last week again that something that happens to her often she tells me she did talk to [employer] and they said that it may take up to three [name] to get the brace approved for her. Hopefully that will not take that long but nonetheless in the meantime I do think the cast could be of benefit. 10/12/18 on evaluation today patient appears to be doing rather well in regard to her toe ulcer. It's just been [name] few days and already this is significantly improved both as far as overall appearance and size. Fortunately there's no sign of infection. She is here for her first obligatory cast change. 10/19/18 Seen today for follow up and management of left great toe ulcer. Wound continues to show improvement. Noted small open area with seroussang drainage with palpation. Denies any increased pain or recent fevers during visit. She will continue calcium alginate with offloading shoe. Denies any questions or concerns during visit. 10/26/18 on evaluation today patient appears to be doing about the same as when I last saw her in regard to her wound bed. Fortunately there does not appear to be any signs of infection. Unfortunately she continues to have [name] breakdown in regard to the toe region any time that she is not in the cast. It takes almost no time at all for this to happen. Nonetheless she still has not heard anything from the brace being made by [employer] as to when exactly this will be available to her. Fortunately there is no signs of infection at this time. 10/30/18 on evaluation today patient presents for application of the total contact cast as we just received him this morning. Fortunately we are gonna be able to apply this to her today which is great news. She continues to have no significant pain which is good news. Overall I do feel like things have been improving while she was the cast is when she doesn't have [name] cast that things get worse. She still has not really heard anything from  [place] regarding her brace. 11/02/18 upon evaluation today patient's wound already appears to be doing significantly better which is good news. Fortunately there does not appear to be any signs of infection also good news. Overall I do think the total contact cast as before is helping to heal this area unfortunately it's just not gonna likely keep the area closed and healed without her getting  her brace at least. Again the foot drop is [name] significant issue for her. 11/09/18 on evaluation today patient appears to be doing excellent in regard to her toe ulcer which in fact is completely healed. Fortunately we finally got the situation squared away with the paperwork which was needed to proceed with getting her brace approved by Medicaid. I have filled that out unfortunately that information has been sent to the orthopedic office that I worked at 2 1/2 years ago and not tired Current wound care measures. Fortunately she seems to be doing very well at this time. 11/23/18 on evaluation today patient appears to be doing More Poorly Compared to Last Time I Saw Her. At [facility] She Had Completely Healed. Currently she is continuing to have issues with reopening. She states that she just found out that the brace was approved through Medicaid now she just has to go get measured in order to have this fitted for her and then made. Subsequently she does not have an appointment for this yet that is going to complicate things we obviously cannot put her back in the cast if we do not have everything measured because they're not gonna be able to measure her foot while she is in the cast. 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 Obviously this is unfortunate and does have me somewhat worried as well. 11/30/18 on evaluation today patient's toe ulcer actually appears to be doing fairly well. The good news is she will be  getting her brace in the shoes next week on Wednesday. Hopefully we will be able to get this to heal without having to go back in the cast however she may need the cast in order to get the wound completely heal and then go from there. Fortunately there's no signs of infection at this time. 12/07/18 on evaluation today patient fortunately did receive her brace and she states she could tell this definitely makes her walk better. With that being said she's been having issues with her toe where she noticed yesterday there was [name] lot of tissue that was loosing off this appears to be much larger than what it was previous. She also states that her leg has been read putting much across the top of her foot just about the ankle although this seems to be receiving somewhat. The total area is still red and appears to be someone infected as best I can tell. She is previously taken Bactrim and that may be [name] good option for her today as well. We are gonna see what I wound culture shows as well and I think that this is definitely appropriate. With that being said outside of the culture I still need to initiate something in the interim and that's what I'[name] gonna go ahead and select Bactrim is [name] good option for her. 12/14/18 on evaluation today patient appears to be doing better in regard to her left great toe ulcer as compared to last week's evaluation. There's still some erythema although this is significantly improved which is excellent news. Overall I do believe that she is making good progress is still gonna take some time before she is where I would like her to be from the standpoint of being able to place her back into the total contact cast. Hopefully we will be where we need to be by next week. 12/21/18 on evaluation today patient actually  appears to be doing poorly in regard to her toe ulcer. She's been tolerating the dressing changes without complication. Fortunately there's no signs of systemic infection although  she does have [name] lot of drainage from the toe ulcer and this does seem to be causing some issues at this point. She does have erythema on the distal portion of her toe that appears to be likely cellulitis. 12/28/18 on evaluation today patient actually appears to be doing [name] little better in my pinion in regard to her toe ulcer. With that being said she still does have some evidence of infection at this time and for her culture she had both E. coli as well as enterococcus as organisms noted on evaluation. For that reason I think that though the Keflex likely has treated the E. coli rather well this has really done nothing for the enterococcus. We are going to have to initiate treatment for this specifically. 01/04/19 on evaluation today patient's toe actually appears to be doing better from the standpoint of infection. She currently would like to see about putting the cash back on I think that this is appropriate as long as she takes care of it and keeps it from getting wet. She is gonna have some drainage we can definitely pass this up with Drawtex and alginate to try to prevent as much drainage as possible from causing the problems. With that being said I do want to at least try her with the cast between now and Tuesday. If there any issues we can't continue to use it then I will discontinue the use of the cast at that point. 01/08/19 on evaluation today patient actually appears to be doing very well as far as her foot ulcer specifically the great toe on the left is concerned. She did have an area of rubbing on the medial aspect of her left ankle which again is from the cast. Fortunately there's no signs of infection at this point in this appears to be [name] very slight skin breakdown. The patient tells me she felt it rubbing but didn't think it was that bad. Fortunately there is no signs of active infection at this time which is good news. No fevers, chills, nausea, or vomiting noted at this time. 01/15/19 on  evaluation today patient actually appears to be doing well in regard to her toe ulcer. Again as previous she seems to do well and she has the cast on which indicates to me that during the time she doesn't have [name] cast on she's putting way too much pressure on this region. Obviously I think that's gonna be an issue as with the current national emergency concerning the Covid-19 Virus it has been recommended that we discontinue the use of total contact casting by the chief medical officer of our company, Dr. Simona Huh. The reasoning is that if [name] patient becomes sick and cannot come into have the cast removed they could not just leave this on for an additional two [name]. Obviously the hospitals also do not want to receive patient's who are sick into the emergency department to potentially contaminate the region and spread the Covid-19 Virus among other sick individuals within the hospital system. Therefore at this point we are suspending the use of total contact cast until the current emergency subsides. This was all discussed with the patient today as well. 01/22/19 on evaluation today patient's wound on her left great toe appears to be doing slightly worse than previously noted last week. She tells me that she  has been on this quite [name] bit in fact she tells me she's been awake for 38 straight hours. This is due to the fact that she's having to care for grandparents because nobody else will. She has been taking care of them for five the last seven days since I've seen her they both have dementia his is from [name] stroke and her grandmother's was progressive. Nonetheless she states even her mom who knows her condition and situation has only help two of those days to take care of them she's been taking care of the rest. Fortunately there does not appear to be any signs of active infection in regard to her toe at this point although obviously it doesn't look as good as it did previous. I think this is directly related  to her not taking off the pressure and friction by way of taking things easy. Though I completely understand what's going on. 01/29/19 on evaluation today patient's tools are actually appears to be showing some signs of improvement today compared to last week's evaluation as far as not necessarily the overall size of the wound but the fact that she has some new skin growth in between the two ends of the wound opening. Overall I feel like she has done well she states that she had [name] family member give her what sounds to be [name] CAM walker boot which has been helpful as well. [name], [name] ([id number]) [id number] Page 14 of 19 02/05/19 on evaluation today patient's wound bed actually appears to be doing significantly better in regard to her overall appearance of the size of the wound. With that being said she is still having an issue with offloading efficiently enough to get this to close. Apparently there is some signs of infection at this point as well unfortunately. Previously she's done well of Augmentin I really do not see anything that needs to be culture currently but there theme and cellulitis of the foot that I'[name] seeing I'[name] gonna go ahead and place her on an antibiotic today to try to help clear this up. 02/12/2019 on evaluation today patient actually appears to be doing poorly in regard to her overall wound status. She tells me she has been using her offloading shoe but actually comes in today wearing her tennis shoe with the AFO brace. Again as I previously discussed with her this is really not sufficient to allow the area to heal appropriately. Nonetheless she continues to be somewhat noncompliant and I do wonder based on what she has told my nurse in the past as to whether or not [name] good portion of this noncompliance may be recreational drug and alcohol related. She has had [name] history of heroin overdose and this was fairly recently in the past couple of months that have  been seeing her. Nonetheless overall I feel like her wound looks significantly worse today compared to what it was previous. She still has significant erythema despite the Augmentin I am not sure that this is an appropriate medication for her infection I am also concerned that the infection may have gone down into her bone. 02/19/19 on evaluation today patient actually appears to be doing about the same in regard to her toe ulcer. Unfortunately she continues to show signs of bone exposure and infection at this point. There does not appear to be any evidence of worsening of the infection but I'[name] also not really sure that it's getting significantly better. She is on the Augmentin which should be sufficient for  the Staphylococcus aureus infection that she has at this point. With that being said she may need IV antibiotics to more appropriately treat this. We did have [name] discussion today about hyperbaric option therapy. 02/28/19 on evaluation today patient actually appears to be doing much worse in regard to the wound on her left great toe as compared to even my previous evaluation last week. Unfortunately this seems to be training in [name] pretty poor direction. Her toe was actually now starting to angle laterally and I can actually see the entire joint area of the proximal portion of the digit where is the distal portion of the digit again is no longer even in contact with the joint line. Unfortunately there's [name] lot more necrotic tissue around the edge and the toe appears to be showing signs of becoming gangrenous in my pinion. I'[name] very concerned about were things stand at this point. She did see infectious disease and they are planning to send in [name] prescription for Sivextro for her and apparently this has been approved. With that being said I don't think she should avoid taking this but at the same time I'[name] not sure that it's gonna be sufficient to save her toe at this point. She tells me that she still having  to care for grandparents which I think is putting quite [name] bit of strain on her foot and specifically the total area and has caused this to break down even to [name] greater degree than would've otherwise been expected. 03/05/19 on evaluation today patient actually appears to be doing quite well in regard to her toe all things considering. She still has bone exposed but there appears to be much less your thing on overall the appearance of the wound and the toe itself is dramatically improved. She still does have some issues currently obviously with infection she did see vascular as well and there concerned that her blood flow to the toad. For that reason they are setting up for an angiogram next week. 03/14/19 on evaluation today patient appears to be doing very poor in regard to her toe and specifically in regard to the ulceration and the fact that she's starting to notice the toe was leaning even more towards the lateral aspect and the complete joint is visible on the proximal aspect of the joint. Nonetheless she's also noted [name] significant odor and the tip of the toe is turning more dark and necrotic appearing. Overall I think she is getting worse not better as far as this is concerned. For that reason I am recommending at this point that she likely needs to be seen for likely amputation. READMISSION 03/19/2021 This is [name] patient that we cared for in this clinic for [name] prolonged period of time in 2019 and 2020 with [name] left foot and left first toe wound. 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 Luana Shu. In December 2021 she had an ulcer on her right great toe as well as the fourth and fifth toes. 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 She also had an angiogram at that time and underwent angioplasty of the right anterior tibial artery. In any case she claims  that the wound on the right foot is closed I did not look at this today which was probably an oversight although I think that should be done next week. After her  surgery she developed [name] dehiscence but I do not see any follow-up of this. According to Dr. Deborra Medina last review that she was out of the area being cared for by another physician but recently came back to his attention. 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 [name] culture of this area showed E. coli apparently before she came back to see Dr. Luana Shu she was supposed to be receiving antibiotics but she did not really take them. Nor is she offloading this area at all. Finally her last hemoglobin A1c listed in epic was in March 2022 at 14.1 she says things are [name] lot better since then although I am not sure. 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 She had this wound at the time. During this admission she had E. coli sepsis [name] TEE was negative. 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 She is still [name] heavy cigarette smoker. She has [name] history of bipolar disorder seizure disorder ABI in our clinic was 1.05 6/1; left midfoot in the setting of [name] TMA done previously. Round circular wound with [name] "knuckle" of protruding tissue. The problem is that the knuckle was not attached to any of the surrounding granulation and this probed proximally widely I removed [name] large portion of this tissue. This wound goes with considerable undermining laterally. I do not feel any bone there was no purulence but this is [name] deep wound. 6/8; in spite of the debridement I did last week. She arrives with [name] wound looking exactly  the same. [name] protruding "knuckle" of tissue nonadherent to most of the surrounding tissue. There is considerable depth around this from 6-12 o'clock at 2.7 cm and undermining of 1 cm. This does not look overtly infected and the x- ray I did last week was negative for any osseous abnormalities. We have been using silver collagen 6/15; deep tissue culture I did last week showed moderate staph aureus and moderate Pseudomonas. This will definitely require prolonged antibiotic therapy. The pathology on the protuberant area was negative for malignancy fungus etc. 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 We have been using silver collagen. I am going to be prescribing Levaquin for 2 [name]. Her CT scan of the foot is down for 7/5 6/22; CT scan of the foot on 7 5. She says she has hardware in the left leg from her previous fracture. She is on the Levaquin for the deep tissue culture I did that showed methicillin sensitive staph aureus and Pseudomonas. I gave her [name] 2-week supply and she will have another week. She arrives in clinic today with the same protuberant tissue however this is nonadherent to the tissue surrounding it. I am really at [name] loss to explain this unless there is underlying deep tissue infection 6/29; patient presents for 1 week follow-up. She has been using collagen to the wound bed. She reports taking her antibiotics as prescribed.She has no complaints or issues today. She denies signs of infection. 7/6; patient presents for one week followup. She has been using collagen to the wound bed. She states she is taking Levaquin however at times she is not able to keep it down. She denies signs of infection.  7/13; patient presents for 1 week follow-up. She has been using silver alginate to the wound bed. She still has nausea when taking her antibiotics. She denies signs of infection. 7/20; patient presents for 1 week follow-up. She has  been using silver alginate with gentamicin cream to the wound bed. She denies any issues and has no complaints today. She denies signs of infection. 7/27; patient presents for 1 week follow-up. She continues to use silver alginate with gentamicin cream to the wound bed. She reports starting her antibiotics. She has no issues or complaints. Overall she reports stability to the wound. 8/3; patient presents for 1 week follow-up. She has been using silver alginate with gentamicin cream to the wound bed. She reports completing all antibiotics. She has no issues or complaints today. She denies signs of infection. 8/17; patient presents for 2-week follow-up. He is to use silver alginate to the wound bed. She has no issues or complaints today. She denies signs of infection. She reports her pain has improved in her foot since last clinic visit 8/24; patient presents for 1 week follow-up. She continues to use silver alginate to the wound bed. She has no issues or complaints. She denies signs of infection. Pain is stable. 9/7; patient presents for follow-up. She missed her last week appointment due to feeling ill. She continues to use silver alginate. She has [name] new wound to the right lower extremity that is covered in eschar. She states It occurred over the past week and has no idea how it started. She currently denies signs of TIMMI, DEVORA [name] ([id number]) [id number] Page 15 of 19 infection. 9/14; patient presents for follow-up. T the left foot wound she has been using gentamicin cream and silver alginate. T the right lower extremity wound she has o o been keeping this covered and has not obtain Santyl. 9/21; patient presents for follow-up. She reports using gentamicin cream and silver alginate to the left foot and Santyl to the right lower extremity wound. She has no issues or complaints today. She denies signs of infection. 9/28; patient presents for follow-up. She reports [name]  new wound to her right heel. She states this occurred [name] few days ago and is progressively gotten worse. She has been trying to clean the area with [name] Q-tip and Santyl. She reports stability in the other 2 wounds. She has been using gentamicin cream and silver alginate to the left foot and Santyl to the right lower extremity wound. 10/12; patient presents for follow-up. She reports improvement to the wound beds. She is seeing vein and vascular to discuss the potential of [name] left BKA. She states they are going to do an arteriogram. She continues to use silver alginate with dressing changes to her wounds. 11/2; patient presents for follow-up. She states she has not been doing dressing changes to the wound beds. She states she is not able to offload the areas. She reports chronic pain to her left foot wound. 11/9; patient presents for follow-up. She came in with only socks on. She states she forgot to put on shoes. It is unclear if she is doing any dressing changes. She currently denies systemic signs of infection. 11/16; patient presents for follow-up. She came again only with socks on. She states she does not wear shoes ever. It is unclear if she does dressing changes. She currently denies systemic signs of infection. 11/23; patient presents for follow-up. She wore her shoes today. It still unclear exactly what dressing she is using for each  wound but she did states she obtained Dakin's solution and has been using this to the left foot wound. She currently denies signs of infection. 11/30; patient presents for follow-up. She has no issues or complaints today. She currently denies signs of infection. 12/7; patient presents for follow-up. She has no issues or complaints today. She has been using Hydrofera Blue to the right heel wound and Dakin solution to the left foot wound. Her right anterior leg wound is healed. She currently denies signs of infection. 12/14; patient presents for follow-up. She has been  using Hydrofera Blue to the right heel and Dakin's to the left foot wounds. She has no issues or complaints today. She denies signs of infection. 12/21; patient presents for follow-up. She reports using Hydrofera Blue to the right heel and Dakin's to the left foot wound. She denies signs of infection. 12/28; patient presents for follow-up. She continues to use Dakin's to the left foot wound and Hydrofera Blue to the right heel wound. She denies signs of infection. 1/4; patient presents for follow-up. She has no issues or complaints today. She denies signs of infection. 1/11; patient presents for follow-up. It is unclear if she has been dressing these wounds over the past week. She currently denies signs of infection. 1/18; patient presents for follow-up. She states she has been using Dakin's wet-to-dry dressings to the left foot. She has been using Hydrofera Blue to the right foot foot wound. She states that the anterior right leg wound has reopened and draining serous fluid. She denies signs of infection. 1/25; patient presents for follow-up. She has no issues or complaints today. 2/1; patient presents for follow-up. She has no issues or complaints today. She denies signs of infection. 2/8; patient presents for follow-up. She has lost her surgical shoes. She did not have [name] dressing to the right heel wound. She currently denies signs of infection. 2/15; patient presents for follow-up. She reports more pain to the right heel today. She denies purulent drainage Or fever/chills 2/22; patient presents for follow-up. She reports taking clindamycin over the past week. She states that she continues to have pain to her right heel. She reports purulent drainage. Readmission 03/16/2022 Ms. Rehana Uncapher is [name] 47 year old female with [name] past medical history of type 2 diabetes, osteomyelitis to her feet, chronic systolic heart failure and bipolar disorder that presents to the clinic for bilateral feet wounds and  right lower extremity wound. She was last seen in our clinic on 12/15/2021. At that time she had purulent drainage coming out of her right plantar foot and I recommended she go to the ED. She states she went to [facility] and has been there for the past 3 months. I cannot see the records. She states she had OR debridement and was on several [name] of IV antibiotics while inpatient. Since discharge she has not been taking care of the wound beds. She had nothing on her feet other than socks today. She currently denies signs of infection. 5/31; patient presents for follow-up. She has been using Dakin's wet-to-dry dressings to the wound beds on her feet bilaterally and antibiotic ointment to the right anterior leg wound. She had [name] wound culture done at last clinic visit that showed moderate Pseudomonas aeruginosa sensitive to ciprofloxacin. She currently denies systemic signs of infection. 6/14; patient presents for follow-up. She received Keystone 5 days ago and has been using this on the wound beds. She states that last week she had to go to the hospital  because she had increased warmth and erythema to the right foot. She was started on 2 oral antibiotics. She states she has been taking these. She currently denies systemic signs of infection. She has no issues or complaints today. 6/21; patient presents for follow-up. She states she has been using Keystone antibiotics to the wound beds. She has no issues or complaints today. She denies signs of infection. 6/28; patient presents for follow-up. She has been using Keystone antibiotics to the wound beds. She has no issues or complaints today. 7/12; patient presents for follow-up. Has been using Keystone antibiotics to the wound beds with calcium alginate. She has no issues or complaints today. She never followed up with her orthopedic surgeon who did the OR debridement to the right foot. We discussed the total contact cast for the left foot and  patient would like to do this next week. 7/19; patient presents for follow-up. She has been using Keystone antibiotics with calcium alginate to the wound beds. She has no issues or complaints today. Patient is in agreement to do the total contact cast of the left foot today. She knows to return later this week for the obligatory cast change. 05-13-2022 upon evaluation today patient's wound which she has the cast of the left leg actually appears to be doing significantly better. Fortunately I do not see any signs of active infection locally or systemically which is great news and overall I am extremely pleased with where we stand currently. 7/26; patient presents for follow-up. She has [name] cast in place for the past week. She states it irritated her shin. Other than that she tolerated the cast well. She states she would like [name] break for 1 week from the cast. We have been using Keystone antibiotic and Aquacel to both wound beds. She denies signs of infection. 8/2; patient presents for follow-up. She has been using Keystone and Aquacel to the wound beds. She denies any issues and has no complaints. She is agreeable to have the cast placed today for the left leg. [name], CIRRINCIONE [name] ([id number]) [id number] Page 16 of 19 06-03-2022 upon evaluation today patient appears to be doing well with regard to her wound she saw some signs of improvement which is great news. Fortunately I do not see any evidence of active infection locally or systemically at this time which is great news. No fevers, chills, nausea, vomiting, or diarrhea. 8/16; patient presents for follow-up. She has no issues or complaints today. We have been using Keystone and Aquacel to the wound beds. The left lower extremity is in [name] total contact cast. She is tolerated this well. 8/23; patient presents for follow-up. She has had the total contact cast on the left leg for the past week. Unfortunately this has rubbed and  broken down the skin to the medial foot. She currently denies signs of infection. She has been using Keystone antibiotic to the right plantar foot wound. 8/30; patient presents for follow-up. We have held off on the total contact cast for the left leg for the past week. Her wound on the left foot has improved and the previous surrounding breakdown of skin has epithelialized. She has been using Keystone antibiotic to both wound beds. She has no issues or complaints today. She denies signs of infection. 9/6; patient presents for follow-up. She has ordered her's Keystone antibiotic and this is arriving this week. She has been doing Dakin's wet-to-dry dressings to the wound beds. She denies signs of infection. She is agreeable to the total  contact cast today. 9/13; patient presents for follow-up. She states that the cast caused her left leg shin to be sore. She would like to take [name] break from the cast this week. She has been using Keystone antibiotic to the right plantar foot wound. She denies signs of infection. 9/20; patient presents for follow-up. She has been using Keystone antibiotics to the wound beds with calcium alginate to the right foot wound and Hydrofera Blue to the left foot wound. She is agreeable to having the cast placed today. She has been approved for Apligraf and we will order this for next clinic visit. 9/27; patient presents for follow-up. We have been using Keystone antibiotic with Hydrofera Blue to the left foot wound under [name] total contact cast. T the right o foot wound she has been using Keystone antibiotic and calcium alginate. She declines [name] total contact cast today. Apligraf is available for placement and she would like to proceed with this. 07-28-2022 upon evaluation today patient appears to be doing well currently in regard to her wound. She is actually showing signs of significant improvement which is great news. Fortunately I do not see any evidence of active infection  locally nor systemically at this time. She has been seeing Dr. Heber Yettem and to be honest has been doing very well with the cast. Subsequently she comes in today with [name] cast on and we did reapply that today as well. She did not really want to she try to talk me out of that but I explained that if she wanted to heal this is really the right way to go. Patient voiced understanding. In regard to her right foot this is actually [name] lot better compared to the last time I saw her which is also great news. 10/11; patient presents for follow-up. Apligraf and the total contact cast was placed to the left leg at last clinic visit. She states that her right foot wound had burning pain to it with the placement of Apligraf to this area. She has been doing Beulah over this area. She denies signs of infection including increased warmth, erythema or purulent drainage. 11/1; 3-week follow-up. The patient fortunately did not have [name] total contact cast or an Apligraf and on the left foot. She has been using Keystone ABD pads and kerlix and her own running shoes She arrives in clinic today with thick callus and [name] very poor surface on the left foot on the right nonviable skin subcutaneous tissue and [name] deep probing hole. 11/15; patient missed her last clinic appointment. She states she has not been dressing the wound beds for the past 2 [name]. She states that at she had [name] new roommate but is now going back to live with her mother. Apparently its been [name] distracting 2 [name]. Patient currently denies signs of infection. 11/22; patient presents for follow-up. She states she has been using Keystone antibiotic and Dakin's wet-to-dry dressings to the wound beds. She is agreeable for cast placement today. We had ordered Apligraf however this has not been received by our facility. 11/29; Patient had [name] total contact cast placed at last clinic visit and she tolerated this well. We were using silver alginate under the cast. Patient's been  using Keystone antibiotic with Aquacel to the right plantar foot wound. She has no issues or complaints today. Apligraf is available for placement today. Patient would like to proceed with this. 12/6; patient presents for follow-up. She had Apligraf placed in standard fashion last clinic visit under the total  contact cast to the left lower extremity. She has been using Keystone antibiotic and Aquacel to the right plantar foot wound. She has no issues or complaints today. 12/13; patient presents for follow-up. She has finished 5 Apligraf placements. Was told she would not qualify for more. We have been doing [name] total contact cast to the left lower extremity. She has been using Keystone antibiotic and Aquacel to the right plantar foot wound. She has no issues or complaints today. 12/20; patient presents for follow-up. We have been using Hydrofera Blue with Keystone antibiotic under [name] total contact cast of the left lower extremity. She reports using Keystone antibiotic and silver alginate to the right heel wound. She has no issues or complaints today. 12/27; patient presents with [name] healthy wound on the left midfoot. We have Apligraf to apply that to that more also using [name] total contact cast. On the right we are using Keystone and silver alginate. She is offloading the right heel with [name] surgical shoealthough by her admission she is on her feet quite [name] bit Objective Constitutional Sitting or standing Blood Pressure is within target range for patient.. Pulse regular and within target range for patient.Marland Kitchen Respirations regular, non-labored and within target range.. Temperature is normal and within the target range for the patient.Marland Kitchen appears in no distress. Vitals Time Taken: 10:09 AM, Height: 69 in, Weight: 178 lbs, BMI: 26.3, Temperature: 98.4 F, Pulse: 64 bpm, Respiratory Rate: 16 breaths/min, Blood Pressure: 110/73 mmHg. General Notes: Wound exam; right foot the plantar heel apparently an incision site.  This has some depth with [name] divot. I could not feel or appreciate any bone. No purulence and no erythema around the wound oo On the left foot mid medial aspect. Oval-shaped wound that looks improved I removed some adherent slough and reapplied Apligraf in the standard fashion Integumentary (Hair, Skin) Wound #11 status is Open. Original cause of wound was Surgical Injury. The date acquired was: 12/01/2021. The wound has been in treatment 31 [name]. The wound is located on the [place]. The wound measures 0.1cm length x 0.3cm width x 0.4cm depth; 0.024cm^2 area and 0.009cm^3 volume. There is [name], [name] ([id number]) [id number] Page 17 of 19 Fat Layer (Subcutaneous Tissue) exposed. There is [name] medium amount of serosanguineous drainage noted. The wound margin is distinct with the outline attached to the wound base. There is medium (34-66%) pink, pale granulation within the wound bed. There is [name] medium (34-66%) amount of necrotic tissue within the wound bed. Wound #12 status is Open. Original cause of wound was Pressure Injury. The date acquired was: 03/16/2020. The wound has been in treatment 31 [name]. The wound is located on the [place]. The wound measures 1.1cm length x 0.7cm width x 0.1cm depth; 0.605cm^2 area and 0.06cm^3 volume. There is Fat Layer (Subcutaneous Tissue) exposed. There is [name] medium amount of serous drainage noted. The wound margin is flat and intact. There is large (67-100%) red, pink granulation within the wound bed. There is no necrotic tissue within the wound bed. 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 There was [name] Selective/Open Wound Non-Viable Tissue Debridement with [name] total area of 0.77 sq cm performed by Ricard [name], MD. With the following instrument(s): Curette to remove Viable and Non-Viable tissue/material. Material removed includes [place]. [name] time out was conducted at 10:34, prior to the start of the procedure. [name] Minimum amount of bleeding was controlled with Pressure. The procedure was tolerated well. Post Debridement Measurements: 1.1cm length x 0.7cm width x 0.1cm depth; 0.06cm^3 volume. Character of Wound/Ulcer Post Debridement is stable. Severity of Tissue Post Debridement is: Fat layer exposed. Post procedure Diagnosis Wound #12: Same as Pre-Procedure Pre-procedure diagnosis of Wound #12 is [name] Diabetic Wound/Ulcer of the Lower Extremity located on the Left,Medial,Plantar Foot. [name] skin graft procedure using [name] bioengineered skin substitute/cellular or tissue based product was performed by Ricard [name], MD with the following instrument(s): Blade and Scissors. Apligraf was applied and secured with Steri-Strips. 22 sq cm of product was utilized and 22 sq cm was wasted due to wound size. Post Application, bolster was applied. [name] Time Out was conducted at 10:38, prior to the start of the procedure. The procedure was tolerated well. Post procedure Diagnosis Wound #12: Same as Pre-Procedure . Pre-procedure diagnosis of Wound #12 is [name] Diabetic Wound/Ulcer of the Lower Extremity located on the Left,Medial,Plantar Foot . There was [name] T Contact otal Cast Procedure by Ricard [name], MD. Post procedure Diagnosis Wound #12: Same as Pre-Procedure Plan Follow-up Appointments: Return Appointment in 1 week. Nurse Visit as needed Bathing/ Shower/ Hygiene: May shower  with wound dressing protected with water repellent cover or cast protector. No tub bath. Anesthetic (Use 'Patient Medications' Section for Anesthetic Order Entry): Lidocaine applied to wound bed Cellular or Tissue Based Products: Cellular or Tissue Based Product Type: - Apligraf # 1 Edema Control - Lymphedema / Segmental Compressive Device / Other: Elevate, Exercise Daily and Avoid Standing for Long Periods of Time. Elevate legs to the level of the heart and pump ankles as often as possible Elevate leg(s) parallel to the floor when sitting. Off-Loading: T Contact Cast to Left Lower Extremity otal T Contact Cast to Right Lower Extremity - applied to left foot otal Open toe surgical shoe - Heel off loader - Right foot Other: - keep pressure off of feet. Medications-Please add to medication list.: Wound #12 Left,Medial,Plantar Foot: Keystone Compound - Keystone bilat feet WOUND #11: - Foot Wound Laterality: Plantar, Right Cleanser: Dakin 16 (oz) 0.25 1 x Per Day/30 Days Discharge Instructions: Use as directed. Cleanser: Soap and Water 1 x Per Day/30 Days Discharge Instructions: Gently cleanse wound with antibacterial soap, rinse and pat dry prior to dressing wounds Topical: keystone gel 1 x Per Day/30 Days Prim Dressing: Aquacel Extra Hydrofiber Dressing, 2x2 (in/in) 1 x Per Day/30 Days ary Secondary Dressing: ABD Pad 5x9 (in/in) (Generic) 1 x Per Day/30 Days SHAMINA, ETHERIDGE [name] ([id number]) [id number] Page 18 of 19 Discharge Instructions: Cover with ABD pad Secured With: Medipore T - 69M Medipore H Soft Cloth Surgical T ape ape, 2x2 (in/yd) (Generic) 1 x Per Day/30 Days Secured With: Kerlix Roll Sterile or Non-Sterile 6-ply 4.5x4 (yd/yd) (Generic) 1 x Per Day/30 Days Discharge Instructions: Apply Kerlix as directed WOUND #12: - Foot Wound Laterality: Plantar, Left, Medial Cleanser: Dakin 16 (oz) 0.25 (Dispense As Written) 1 x Per Day/30 Days Discharge  Instructions: Use as directed. Topical: Vitamin AandD Ointment, 4(oz) tube 1 x Per Day/30 Days Discharge Instructions: apply to red, broken  down area Prim Dressing: Hydrofera Blue Ready Transfer Foam, 2.5x2.5 (in/in) 1 x Per Day/30 Days ary Discharge Instructions: Apply Hydrofera Blue Ready to wound bed as directed Prim Dressing: Keystone gel 1 x Per Day/30 Days ary Secondary Dressing: ABD Pad 5x9 (in/in) (Generic) 1 x Per Day/30 Days Discharge Instructions: Cover with ABD pad Secured With: Medipore T - 35M Medipore H Soft Cloth Surgical T ape ape, 2x2 (in/yd) (Generic) 1 x Per Day/30 Days Secured With: Kerlix Roll Sterile or Non-Sterile 6-ply 4.5x4 (yd/yd) (Generic) 1 x Per Day/30 Days Discharge Instructions: Apply Kerlix as directed 1. Apligraf to the standard fashion to the left foot and then placed in [name] total contact cast in the standard fashion as well 2. I used some of the left over Apligraf over the [facility] in the right heel tacking this down into the depths of the wound and layering the top of this with silver alginate. 3. I asked the patient to offload the right foot is much as possible, she says she is really active on her feet Electronic Signature(s) Signed: 10/19/2022 4:44:55 PM By: Linton Ham MD Entered By: Linton Ham on 10/19/2022 11:21:55 -------------------------------------------------------------------------------- Total Contact Cast Details Patient Name: Date of Service: SHAMELA, HAYDON 10/19/2022 9:45 [name] Medical Record Number: [id number] Patient Account Number: [id number] Date of Birth/Sex: Treating RN: 03-25-1976 (47 y.o. Orvan Falconer Primary Care Provider: Raelene Bott Other Clinician: Massie Kluver Referring Provider: Treating Provider/Extender: RO BSO Delane Ginger, MICHA EL Baxter Kail in Treatment: 95 T Contact Cast Applied for Wound Assessment: otal Wound #12 Left,Medial,Plantar Foot Performed By: Physician Ricard [name], MD Post  Procedure Diagnosis Same as Pre-procedure Electronic Signature(s) Signed: 10/19/2022 4:44:55 PM By: Linton Ham MD Signed: 11/01/2022 10:27:37 AM By: Massie Kluver Entered By: Massie Kluver on 10/19/2022 10:36:47 [name] ([id number]) [id number] Page 19 of 19 -------------------------------------------------------------------------------- SuperBill Details Patient Name: Date of Service: ANAMARIE, HUNN 10/19/2022 Medical Record Number: [id number] Patient Account Number: [id number] Date of Birth/Sex: Treating RN: [birth year] (46 y.o. Orvan Falconer Primary Care Provider: Raelene Bott Other Clinician: Massie Kluver Referring Provider: Treating Provider/Extender: Eldridge Dace, MICHA EL [name], [name] in Treatment: 31 Diagnosis Coding ICD-10 Codes Code Description [phone] 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ity 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.621 Type 2 diabetes mellitus with foot ulcer E11.42 Type 2 diabetes mellitus with diabetic polyneuropathy L97.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 of skin Facility Procedures : CPT4 Code: [id number] Description: [phone] (Facility Use Only) Apligraf 44 SQ CM Modifier: Quantity: 44 : CPT4 Code: [id number] Description: 03128 - SKIN SUB GRAFT FACE/NK/HF/G ICD-10 Diagnosis Description 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i Modifier: ty Quantity: 1 Physician Procedures : CPT4 Code Description Modifier [id number] - WC PHYS SKIN SUB GRAFT FACE/NK/HF/G ICD-10 Diagnosis Description 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ity Quantity: 1 Electronic Signature(s) Signed: 10/19/2022 4:44:55 PM By: Linton Ham MD Entered By: Linton Ham on 10/19/2022  11:22:07

## 2022-11-01 NOTE — Progress Notes (Signed)
[name], [name] ([id number]) [id number] Page 1 of 8 Visit Report for 10/19/2022 Arrival Information Details Patient Name: Date of Service: [name], [name] 10/19/2022 9:45 [name]: [id number] Patient Account Number: [id number] Date of Birth/Sex: Treating RN: August 18, 1976 (47 y.o. [name] Primary Care Kayloni Rocco: Raelene Bott Other Clinician: Massie Kluver Referring Valdemar Mcclenahan: Treating Antoinne Spadaccini/Extender: RO BSO N, MICHA EL Hermelinda Dellen, [name] Weeks in Treatment: 55 Visit Information History Since Last Visit All ordered tests and consults were completed: No Patient Arrived: Wheel Chair Added or deleted any medications: No Arrival Time: 10:01 Any new allergies or adverse reactions: No Transfer Assistance: None Had [name] fall or experienced change in No Patient Identification Verified: Yes activities of daily living that may affect Secondary Verification Process Completed: Yes risk of falls: Patient Requires Transmission-Based Precautions: No Signs or symptoms of abuse/neglect since last visito No Patient Has Alerts: No Hospitalized since last visit: No Implantable device outside of the clinic excluding No cellular tissue based products placed in the center since last visit: Has Dressing in Place as Prescribed: Yes Has Footwear/Offloading in Place as Prescribed: Yes Left: T Contact Cast otal Pain Present Now: No Electronic Signature(s) Signed: 11/01/2022 10:27:37 AM By: Massie Kluver Entered By: Massie Kluver on 10/19/2022 10:09:23 -------------------------------------------------------------------------------- Encounter Discharge Information Details Patient Name: Date of Service: Heather Hitch [name]. 10/19/2022 9:45 [name]: [id number] Patient Account Number: [id number] Date of Birth/Sex: Treating RN: Jul 01, 1976 (47 y.o. [name] Primary Care Riann Oman: Raelene Bott Other Clinician: Massie Kluver Referring Loretta Kluender: Treating Richardson Dubree/Extender: RO BSO N, MICHA EL Hermelinda Dellen, [name] Weeks in Treatment: 31 Encounter Discharge Information Items Post Procedure Vitals Discharge Condition: Stable Temperature (F): 98.4 Ambulatory Status: Wheelchair Pulse (bpm): 64 Discharge Destination: Home Respiratory Rate (breaths/min): 16 Transportation: Private Auto Blood Pressure (mmHg): 110/73 Accompanied By: mother [name], [name] ([id number]) [id number] Page 2 of 8 Schedule Follow-up Appointment: Yes Clinical Summary of Care: Electronic Signature(s) Signed: 11/01/2022 10:27:37 AM By: Massie Kluver Entered By: Massie Kluver on 10/19/2022 16:10:55 -------------------------------------------------------------------------------- Lower Extremity Assessment Details Patient Name: Date of Service: [name], [name] 10/19/2022 9:45 [name]: [id number] Patient Account Number: [id number] Date of Birth/Sex: Treating RN: 09/27/1976 (47 y.o. [name] Primary Care Kyan Yurkovich: Raelene Bott Other Clinician: Massie Kluver Referring Marlo Goodrich: Treating Birt Reinoso/Extender: RO BSO N, [place] Hoffman, [name] Weeks in Treatment: 31 Electronic Signature(s) Signed: 10/20/2022 9:37:21 AM By: Carlene Coria RN Signed: 11/01/2022 10:27:37 AM By: Massie Kluver Entered By: Massie Kluver on 10/19/2022 10:23:40 -------------------------------------------------------------------------------- Multi Wound Chart Details Patient Name: Date of Service: Heather Hitch [name]. 10/19/2022 9:45 [name]: [id number] Patient Account Number: [id number] Date of Birth/Sex: Treating RN: 09/15/1976 (47 y.o. [name] Primary Care Danely Bayliss: Raelene Bott Other Clinician: Massie Kluver Referring Florena Kozma: Treating Annarose Ouellet/Extender: RO BSO N, MICHA EL Hermelinda Dellen, [name] Weeks in Treatment: 31 Vital Signs Height(in): 69 Pulse(bpm): 64 Weight(lbs):  178 Blood Pressure(mmHg): 110/73 Body Mass Index(BMI): 26.3 Temperature(F): 98.4 Respiratory Rate(breaths/min): 16 [11:Photos:] [N/[name]:N/[name] [id number] Page 3 of 8] Right, Plantar Foot Left, Medial, Plantar Foot N/[name] Wound Location: Surgical Injury Pressure Injury N/[name] Wounding Event: Open Surgical Wound 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: Extremity Chronic sinus problems/congestion, Chronic sinus problems/congestion, N/[name] Comorbid History: Middle ear problems, Anemia, Chronic Middle ear problems, Anemia, Chronic Obstructive Pulmonary Disease Obstructive Pulmonary Disease (COPD), Congestive Heart Failure, (COPD), Congestive Heart Failure, Type II Diabetes, End Stage Renal Type II Diabetes, End Stage Renal  Disease, History of pressure wounds, Disease, History of pressure wounds, Neuropathy Neuropathy 12/01/2021 03/16/2020 N/[name] Date Acquired: 19 31 N/[name] Weeks of Treatment: Open Open N/[name] Wound Status: No No N/[name] Wound Recurrence: 0.1x0.3x0.4 1.1x0.7x0.1 N/[name] Measurements L x W x D (cm) 0.024 0.605 N/[name] [name] (cm) : rea 0.009 0.06 N/[name] Volume (cm) : 98.70% 87.20% N/[name] % Reduction in Area: 99.50% 93.60% N/[name] % Reduction in Volume: Full Thickness Without Exposed Grade 3 N/[name] Classification: Support Structures Medium Medium N/[name] Exudate Amount: Serosanguineous Serous N/[name] Exudate Type: red, brown amber N/[name] Exudate Color: Distinct, outline attached Flat and Intact N/[name] Wound Margin: Medium (34-66%) Large (67-100%) N/[name] Granulation Amount: Pink, Pale Red, Pink N/[name] Granulation Quality: Medium (34-66%) None Present (0%) N/[name] Necrotic Amount: Fat Layer (Subcutaneous Tissue): Yes Fat Layer (Subcutaneous Tissue): Yes N/[name] Exposed Structures: Fascia: No Fascia: No Tendon: No Tendon: No Muscle: No Muscle: No Joint: No Joint: No Bone: No Bone: No Medium (34-66%) Small (1-33%) N/[name] Epithelialization: Treatment Notes Electronic  Signature(s) Signed: 11/01/2022 10:27:37 AM By: Massie Kluver Entered By: Massie Kluver on 10/19/2022 10:23:45 -------------------------------------------------------------------------------- Multi-Disciplinary Care Plan Details Patient Name: Date of Service: Heather Hitch [name]. 10/19/2022 9:45 [name]: [id number] Patient Account Number: [id number] Date of Birth/Sex: Treating RN: Feb 01, 1976 (47 y.o. [name] Primary Care Sanuel Ladnier: Raelene Bott Other Clinician: Massie Kluver Referring Geryl Dohn: Treating Antonios Ostrow/Extender: RO BSO N, MICHA EL Hermelinda Dellen, [name] Weeks in Treatment: 31 Active Inactive Abuse / Safety / Falls / Self Care Management Nursing Diagnoses: History of Falls Potential for falls Potential for injury related to falls [name], [name] ([id number]) [id number] Page 4 of 8 Goals: Patient will not develop complications from immobility Date Initiated: 03/17/2022 Date Inactivated: 04/20/2022 Target Resolution Date: 03/16/2022 Goal Status: Met Patient/caregiver will verbalize understanding of skin care regimen Date Initiated: 03/17/2022 Date Inactivated: 09/28/2022 Target Resolution Date: 03/16/2022 Goal Status: Met Patient/caregiver will verbalize/demonstrate measure taken to improve self care Date Initiated: 03/17/2022 Target Resolution Date: 03/16/2022 Goal Status: Active Interventions: Assess fall risk on admission and as needed Provide education on basic hygiene Provide education on personal and home safety Notes: Necrotic Tissue Nursing Diagnoses: Impaired tissue integrity related to necrotic/devitalized tissue Knowledge deficit related to management of necrotic/devitalized tissue Goals: Necrotic/devitalized tissue will be minimized in the wound bed Date Initiated: 03/17/2022 Target Resolution Date: 10/26/2022 Goal Status: Active Patient/caregiver will verbalize understanding of reason and process for debridement  of necrotic tissue Date Initiated: 03/17/2022 Date Inactivated: 09/28/2022 Target Resolution Date: 03/16/2022 Goal Status: Met Interventions: Provide education on necrotic tissue and debridement process Treatment Activities: Apply topical anesthetic as ordered : 03/16/2022 Excisional debridement : 03/16/2022 Notes: Osteomyelitis Nursing Diagnoses: Infection: osteomyelitis Knowledge deficit related to disease process and management Goals: Patient/caregiver will verbalize understanding of disease process and disease management Date Initiated: 03/17/2022 Target Resolution Date: 03/16/2022 Goal Status: Active Patient's osteomyelitis will resolve Date Initiated: 03/17/2022 Target Resolution Date: 10/26/2022 Goal Status: Active Signs and symptoms for osteomyelitis will be recognized and promptly addressed Date Initiated: 03/17/2022 Target Resolution Date: 10/26/2022 Goal Status: Active Interventions: Assess for signs and symptoms of osteomyelitis resolution every visit Provide education on osteomyelitis Treatment Activities: Systemic antibiotics : 03/16/2022 Notes: Patient on antibiotics. Electronic Signature(s) Signed: 10/20/2022 9:37:21 AM By: Carlene Coria RN Signed: 11/01/2022 10:27:37 AM By: Massie Kluver Entered By: Massie Kluver on 10/19/2022 [place], Shevonne [name] ([id number]) [id number] Page 5 of 8 -------------------------------------------------------------------------------- Pain Assessment Details Patient Name: Date of Service: [name], [name] 10/19/2022 9:45 [name]:  [id number] Patient Account Number: [id number] Date of Birth/Sex: Treating RN: 03-20-1976 (47 y.o. [name] Primary Care Logyn Dedominicis: Raelene Bott Other Clinician: Massie Kluver Referring Toshiye Kever: Treating Anikin Prosser/Extender: RO BSO N, MICHA EL Hermelinda Dellen, [name] Weeks in Treatment: 31 Active Problems Location of Pain Severity and Description of  Pain Patient Has Paino No Site Locations Pain Management and Medication Current Pain Management: Electronic Signature(s) Signed: 10/20/2022 9:37:21 AM By: Carlene Coria RN Signed: 11/01/2022 10:27:37 AM By: Massie Kluver Entered By: Massie Kluver on 10/19/2022 10:22:07 -------------------------------------------------------------------------------- Patient/Caregiver Education Details Patient Name: Date of Service: [name] 12/27/2023andnbsp9:45 [name]: [id number] Patient Account Number: [id number] Date of Birth/Gender: Treating RN: 1975-12-11 (47 y.o. [name] Primary Care Physician: Raelene Bott Other Clinician: Massie Kluver Referring Physician: Treating Physician/Extender: Eldridge Dace, [place]et Hoffman, [name] Weeks in Treatment: [address] [name] ([id number]) [id number] Page 6 of 8 Education Assessment Education Provided To: Patient and Caregiver Education Topics Provided Wound/Skin Impairment: Handouts: Other: continue wound care as directed Methods: Explain/Verbal Responses: State content correctly Electronic Signature(s) Signed: 11/01/2022 10:27:37 AM By: Massie Kluver Entered By: Massie Kluver on 10/19/2022 16:09:01 -------------------------------------------------------------------------------- Wound Assessment Details Patient Name: Date of Service: [name], [name] 10/19/2022 9:45 [name]: [id number] Patient Account Number: [id number] Date of Birth/Sex: Treating RN: 01/14/1976 (47 y.o. [name] Primary Care Jermichael Belmares: Raelene Bott Other Clinician: Massie Kluver Referring Maebel Marasco: Treating Faige Seely/Extender: RO BSO N, MICHA EL Hermelinda Dellen, [name] Weeks in Treatment: 31 Wound Status Wound Number: 11 Primary Open Surgical Wound Etiology: Wound Location: Right, Plantar Foot Wound Open Wounding Event: Surgical Injury Status: Date Acquired: 12/01/2021 Comorbid Chronic sinus  problems/congestion, Middle ear problems, Anemia, Weeks Of Treatment: 31 History: Chronic Obstructive Pulmonary Disease (COPD), Congestive Heart Clustered Wound: No Failure, Type II Diabetes, End Stage Renal Disease, History of pressure wounds, Neuropathy Photos Wound Measurements Length: (cm) 0.1 Width: (cm) 0.3 Depth: (cm) 0.4 Area: (cm) 0.024 Volume: (cm) 0.009 % Reduction in Area: 98.7% % Reduction in Volume: 99.5% Epithelialization: Medium (34-66%) Wound Description Classification: Full Thickness Without Exposed Support Structures Wound Margin: Distinct, outline attached [name], [name] ([id number]) Exudate Amount: Medium Exudate Type: Serosanguineous Exudate Color: red, brown Foul Odor After Cleansing: No Slough/Fibrino Yes [id number] Page 7 of 8 Wound Bed Granulation Amount: Medium (34-66%) Exposed Structure Granulation Quality: Pink, Pale Fascia Exposed: No Necrotic Amount: Medium (34-66%) Fat Layer (Subcutaneous Tissue) Exposed: Yes Tendon Exposed: No Muscle Exposed: No Joint Exposed: No Bone Exposed: No Electronic Signature(s) Signed: 10/20/2022 9:37:21 AM By: Carlene Coria RN Signed: 11/01/2022 10:27:37 AM By: Massie Kluver Entered By: Massie Kluver on 10/19/2022 10:22:45 -------------------------------------------------------------------------------- Wound Assessment Details Patient Name: Date of Service: Heather Hitch [name]. 10/19/2022 9:45 [name]: [id number] Patient Account Number: [id number] Date of Birth/Sex: Treating RN: [birth year] (47 y.o. [name] Primary Care Carolyne Whitsel: Raelene Bott Other Clinician: Massie Kluver Referring Amahd Morino: Treating Margia Wiesen/Extender: RO BSO N, MICHA EL Hermelinda Dellen, [name] Weeks in Treatment: 31 Wound Status Wound Number: 12 Primary Diabetic Wound/Ulcer of the Lower Extremity Etiology: Wound Location: Left, Medial, Plantar Foot Wound Open Wounding Event: Pressure  Injury Status: Date Acquired: 03/16/2020 Comorbid Chronic sinus problems/congestion, Middle ear problems, Anemia, Weeks Of Treatment: 31 History: Chronic Obstructive Pulmonary Disease (COPD), Congestive Heart Clustered Wound: No Failure, Type II Diabetes, End Stage Renal Disease, History of pressure wounds, Neuropathy Photos Wound Measurements Length: (cm) 1.1 Width: (cm) 0.7 Depth: (cm) 0.1 Area: (cm) 0.605 Volume: (cm) 0.06 %  Reduction in Area: 87.2% % Reduction in Volume: 93.6% Epithelialization: Small (1-33%) Wound Description Classification: Grade 3 [place], Mirabel [name] ([id number]) Wound Margin: Flat and Intact Exudate Amount: Medium Exudate Type: Serous Exudate Color: amber Foul Odor After Cleansing: No [id number] Page 8 of 8 Slough/Fibrino No Wound Bed Granulation Amount: Large (67-100%) Exposed Structure Granulation Quality: Red, Pink Fascia Exposed: No Necrotic Amount: None Present (0%) Fat Layer (Subcutaneous Tissue) Exposed: Yes Tendon Exposed: No Muscle Exposed: No Joint Exposed: No Bone Exposed: No Electronic Signature(s) Signed: 10/20/2022 9:37:21 AM By: Carlene Coria RN Signed: 11/01/2022 10:27:37 AM By: Massie Kluver Entered By: Massie Kluver on 10/19/2022 10:23:29 -------------------------------------------------------------------------------- [place] Details Patient Name: Date of Service: Heather Hitch [name]. 10/19/2022 9:45 [name]: [id number] Patient Account Number: [id number] Date of Birth/Sex: Treating RN: 11/26/1975 (47 y.o. [name] Primary Care [name] [name]: Raelene Bott Other Clinician: Massie Kluver Referring Luvinia Lucy: Treating Nezar Buckles/Extender: RO BSO N, MICHA EL Hermelinda Dellen, [name] Weeks in Treatment: 31 Vital Signs Time Taken: 10:09 Temperature (F): 98.4 Height (in): 69 Pulse (bpm): 64 Weight (lbs): 178 Respiratory Rate (breaths/min): 16 Body Mass Index (BMI): 26.3 Blood Pressure  (mmHg): 110/73 Reference Range: 80 - 120 mg / dl Electronic Signature(s) Signed: 11/01/2022 10:27:37 AM By: Massie Kluver Entered By: Massie Kluver on 10/19/2022 10:11:49

## 2022-11-02 ENCOUNTER — Encounter (HOSPITAL_BASED_OUTPATIENT_CLINIC_OR_DEPARTMENT_OTHER): Payer: Medicaid Other | Admitting: Internal Medicine

## 2022-11-02 DIAGNOSIS — L97528 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ity: Secondary | ICD-10-CM

## 2022-11-02 DIAGNOSIS — E11621 Type 2 diabetes mellitus with foot ulcer: Secondary | ICD-10-CM

## 2022-11-02 DIAGNOSIS — L97512 Non-pressure chronic ulcer of other part of right foot with fat layer exposed: Secondary | ICD-10-CM | POA: Diagnosis not present

## 2022-11-03 NOTE — Progress Notes (Signed)
BRAILYN, KILLION A ([id number]) [id number] Page 1 of 22 Visit Report for 11/02/2022 Chief Complaint Document Details Patient Name: Date of Service: CHRISTEE, MERVINE 11/02/2022 11:00 A M Medical Record Number: [id number] Patient Account Number: [id number] Date of Birth/Sex: Treating RN: 10/10/1976 (47 y.o. [name] Primary Care Provider: Raelene Bott Other Clinician: Massie Kluver Referring Provider: Treating Provider/Extender: Eddie North in Treatment: 79 Information Obtained from: Patient Chief Complaint 03/19/2021; patient referred by Dr. Luana Shu who has been looking after her left foot for quite a period of time for review of a nonhealing area in the left midfoot 03/12/2022; bilateral feet wounds and right lower extremity wound. Electronic Signature(s) Signed: 11/02/2022 2:05:02 PM By: Kalman Shan DO Entered By: Kalman Shan on 11/02/2022 12:16:52 -------------------------------------------------------------------------------- Cellular or Tissue Based Product Details Patient Name: Date of Service: [name] 11/02/2022 11:00 A M Medical Record Number: [id number] Patient Account Number: [id number] Date of Birth/Sex: Treating RN: [birth year] (47 y.o. [name] Primary Care Provider: Raelene Bott Other Clinician: Massie Kluver Referring Provider: Treating Provider/Extender: Eddie North in Treatment: 33 Cellular or Tissue Based Product Type Wound #12 Left,Medial,Plantar Foot Applied to: Performed By: Physician Kalman Shan, MD Cellular or Tissue Based Product Type: Apligraf Level of Consciousness (Pre-procedure): Awake and Alert Pre-procedure Verification/Time Out Yes - 11:59 Taken: Location: genitalia / hands / feet / multiple digits Wound Size (sq cm): 1.5 Product Size (sq cm): 44 Waste Size (sq cm): 42.5 Waste Reason: wound size Amount of Product Applied (sq cm):  1.5 Instrument Used: Forceps, Scissors Lot #: [id number] Expiration Date: 11/09/2022 Fenestrated: DAVION, MEARA A ([id number]) [id number] Page 2 of 22 Instrument: Blade Secured: Yes Secured With: Steri-Strips Dressing Applied: Yes Primary Dressing: mepitel Procedural Pain: 0 Post Procedural Pain: 0 Response to Treatment: Procedure was tolerated well Level of Consciousness (Post- Awake and Alert procedure): Post Procedure Diagnosis Same as Pre-procedure Electronic Signature(s) Signed: 11/03/2022 3:17:21 PM By: Gretta Cool, BSN, RN, CWS, [name] RN, BSN Entered By: Gretta Cool, BSN, RN, CWS, [name] on 11/02/2022 12:02:02 -------------------------------------------------------------------------------- Debridement Details Patient Name: Date of Service: [name] 11/02/2022 11:00 A M Medical Record Number: [id number] Patient Account Number: [id number] Date of Birth/Sex: Treating RN: 02/15/1976 (47 y.o. [name] Primary Care Provider: Raelene Bott Other Clinician: Massie Kluver Referring Provider: Treating Provider/Extender: Eddie North in Treatment: 33 Debridement Performed for Assessment: Wound #11 Right,Plantar Foot Performed By: Physician Kalman Shan, MD Debridement Type: Debridement Level of Consciousness (Pre-procedure): Awake and Alert Pre-procedure Verification/Time Out Yes - 10:40 Taken: T Area Debrided (L x W): otal 1 (cm) x 1 (cm) = 1 (cm) Tissue and other material debrided: Viable, Non-Viable, Callus, Slough, Subcutaneous, Slough Level: Skin/Subcutaneous Tissue Debridement Description: Excisional Instrument: Curette Bleeding: Minimum Hemostasis Achieved: Pressure Response to Treatment: Procedure was tolerated well Level of Consciousness (Post- Awake and Alert procedure): Post Debridement Measurements of Total Wound Length: (cm) 1 Width: (cm) 1 Depth: (cm) 1.1 Volume: (cm) 0.864 Character of  Wound/Ulcer Post Debridement: Stable Post Procedure Diagnosis Same as Pre-procedure Electronic Signature(s) Signed: 11/02/2022 2:05:02 PM By: Kalman Shan DO Signed: 11/03/2022 3:17:21 PM By: Gretta Cool, BSN, RN, CWS, [name] RN, BSN Entered By: Gretta Cool, BSN, RN, CWS, [name] on 11/02/2022 [place]agh, Daishia A ([id number]) [id number] Page 3 of 22 -------------------------------------------------------------------------------- Debridement Details Patient Name: Date of Service: ZENOLA, DEZARN 11/02/2022 11:00 A M Medical Record Number: [id number] Patient Account Number: [id number] Date of Birth/Sex: Treating RN: 1975/12/07 (47 y.o. F) Gretta Cool,  [place] Primary Care Provider: Raelene Bott Other Clinician: Massie Kluver Referring Provider: Treating Provider/Extender: Eddie North in Treatment: 33 Debridement Performed for Assessment: Wound #12 Left,Medial,Plantar Foot Performed By: Physician Kalman Shan, MD Debridement Type: Debridement Severity of Tissue Pre Debridement: Fat layer exposed Level of Consciousness (Pre-procedure): Awake and Alert Pre-procedure Verification/Time Out Yes - 10:40 Taken: T Area Debrided (L x W): otal 3.5 (cm) x 2 (cm) = 7 (cm) Tissue and other material debrided: Viable, Non-Viable, Callus, Slough, Subcutaneous, Slough Level: Skin/Subcutaneous Tissue Debridement Description: Excisional Instrument: Curette Bleeding: Minimum Hemostasis Achieved: Pressure Response to Treatment: Procedure was tolerated well Level of Consciousness (Post- Awake and Alert procedure): Post Debridement Measurements of Total Wound Length: (cm) 3.5 Width: (cm) 2 Depth: (cm) 0.8 Volume: (cm) 4.398 Character of Wound/Ulcer Post Debridement: Stable Severity of Tissue Post Debridement: Fat layer exposed Post Procedure Diagnosis Same as Pre-procedure Electronic Signature(s) Signed: 11/02/2022 2:05:02 PM By: Kalman Shan  DO Signed: 11/03/2022 3:17:21 PM By: Gretta Cool, BSN, RN, CWS, [name] RN, BSN Entered By: Gretta Cool, BSN, RN, CWS, [name] on 11/02/2022 11:55:09 -------------------------------------------------------------------------------- HPI Details Patient Name: Date of Service: [name], Fronie A. 11/02/2022 11:00 A M Medical Record Number: [id number] Patient Account Number: [id number] Date of Birth/Sex: Treating RN: 1976-09-23 (47 y.o. [name] Primary Care Provider: Raelene Bott Other Clinician: Massie Kluver Referring Provider: Treating Provider/Extender: Eddie North in Treatment: [address], [place] ([id number]) [id number] Page 4 of 22 History of Present Illness HPI Description: 01/18/18-She is here for initial evaluation of the left great toe ulcer. She is a poor historian in regards to timeframe in detail. She states approximately 4 weeks ago she lacerated her toe on something in the house. She followed up with her primary care who placed her on Bactrim and ultimately a second dose of Bactrim prior to coming to wound clinic. She states she has been treating the toe with peroxide, Betadine and a Band-Aid. She did not check her blood sugar this morning but checked it yesterday morning it was 327; she is unaware of a recent A1c and there are no current records. She saw Dr. she would've orthopedics last week for an old injury to the left ankle, she states he did not see her toe, nor did she bring it to his attention. She smokes approximately 1 pack cigarettes a day. Her social situation is concerning, she arrives this morning with her mother who appears extremely intoxicated/under the influence; her mother was asked to leave the room and be monitored by the patient's grandmother. The patient's aunt then accompanied the patient and the room throughout the rest of the appointment. We had a lengthy discussion regarding the deleterious effects of uncontrolled  hyperglycemia and smoking as it relates to wound healing and overall health. She was strongly encouraged to decrease her smoking and get her diabetes under better control. She states she is currently on a diet and has cut down her [facility] consumption. The left toe is erythematous, macerated and slightly edematous with malodor present. The edema in her left foot is below her baseline, there is no erythema streaking. We will treat her with Santyl, doxycycline; we have ordered and xray, culture and provided a Peg assist surgical shoe and cultured the wound. 01/25/18-She is here in follow-up evaluation for a left great toe ulcer and presents with an abscess to her suprapubic area. She states her blood sugars remain elevated, feeling "sick" and if levels are below 250, but she is trying. She  has made no attempt to decrease her smoking stating that we "can't take away her food in her cigarettes". She has been compliant with offloading using the PEG assist you. She is using Santyl daily. the culture obtained last week grew staph aureus and Enterococcus faecalis; continues on the doxycycline and Augmentin was added on Monday. The suprapubic area has erythema, no femoral variation, purple discoloration, minimal induration, was accessed with a cotton tip applicator with sanguinopurulent drainage, this was cultured, I suspect the current antibiotic treatment will cover and we will not add anything to her current treatment plan. She was advised to go to urgent care or ER with any change in redness, induration or fever. 02/01/18-She is here in follow-up evaluation for left great toe ulcers and a new abdominal abscess from last week. She was able to use packing until earlier this week, where she "forgot it was there". She states she was feeling ill with GI symptoms last week and was not taking her antibiotic. She states her glucose levels have been predominantly less than 200, with occasional levels between  200-250. She thinks this was contributing to her GI symptoms as they have resolved without intervention. There continues to be significant laceration to left toe, otherwise it clinically looks stable/improved. There is now less superficial opening to the lateral aspect of the great toe that was residual blister. We will transition to [facility] to all wounds, she will continue her Augmentin. If there is no change or deterioration next week for reculture. 02/08/18-She is here in follow-up evaluation for left great toe ulcer and abdominal ulcer. There is an improvement in both wounds. She has been wrapping her left toe with coban, not by our direction, which has created an area of discoloration to the medial aspect; she has been advised to NOT use coban secondary to her neuropathy. She states her glucose levels have been high over this last week ranging from 200-350, she continues to smoke. She admits to being less compliant with her offloading shoe. We will continue with same treatment plan and she will follow-up next week. 02/15/18-She is here in follow-up evaluation for left great toe ulcer and abdominal ulcer. The abdominal ulcer is epithelialized. The left great toe ulcer is improved and all injury from last week using the Coban wrap is resolved, the lateral ulcer is healed. She admits to noncompliance with wearing offloading shoe and admits to glucose levels being greater than 300 most of the week. She continues to smoke and expresses no desire to quit. There is one area medially that probes deeper than it has historically, erythema to the toe and dorsal foot has consistently waxed and waned. There is no overt signs of cellulitis or infection but we will culture the wound for any occult infection given the new area of depth and erythema. We will hold off on sensitivities for initiation of antibiotic therapy. 02/22/18-She is here in follow up evaluation for left great toe ulcer. There is overall  significant improvement in both wound appearance, erythema and edema with changes made last week. She was not initiated on antibiotic therapy. Culture obtained last week showed oxacillin sensitive staph aureus, sensitive to clindamycin. Clindamycin has been called into the pharmacy but she has been instructed to hold off on initiation secondary to overall clinical improvement and her history of antibiotic intolerance. She has been instructed to contact the clinic with any noted changes/deterioration and the wound, erythema, edema and/or pain. She will follow-up next week. She continues to smoke and  her glucose levels remain elevated >250; she admits to compliance with offloading shoe 03/01/18 on evaluation today patient appears to be doing fairly well in regard to her left first toe ulcer. She has been tolerating the dressing changes with the [facility]ressing without complication and overall this has definitely showed signs of improvement according to records as well is what the patient tells me today. I'm very pleased in that regard. She is having no pain today 03/08/18 She is here for follow up evaluation of a left great toe ulcer. She remains non-compliant with glucose control and smoking cessation; glucose levels consistently >200. She states that she got new shoe inserts/peg assist. She admits to compliance with offloading. Since my last evaluation there is significant improvement. We will switch to prisma at this time and she will follow up next week. She is noted to be tachycardic at this appointment, heart rate 120s; she has a history of heart rate 70-130 according to our records. She admits to extreme agitation r/t personal issues; she was advised to monitor her heartrate and contact her physician if it does not return to a more normal range (<100). She takes cardizem twice daily. 03/15/18-She is here in follow-up evaluation for left great toe ulcer. She remains noncompliant with glucose  control and smoking cessation. She admits to compliance with wearing offloading shoe. The ulcer is improved/stable and we will continue with the same treatment plan and she will follow-up next week 03/22/18-She is here for evaluation for left great toe ulcer. There continues to be significant improvement despite recurrent hyperglycemia (over 500 yesterday) and she continues to smoke. She has been compliant with offloading and we will continue with same treatment plan and she will follow-up next week. 03/29/18-She is here for evaluation for left great toe ulcer. Despite continuing to smoke and uncontrolled diabetes she continues to improve. She is compliant with offloading shoe. We will continue with the same treatment plan and she will follow-up next week 04/05/18- She is here in follow up evaluation for a left great toe ulcer; she presents with small pustule to left fifth toe (resembles ant bite). She admits to compliance with wearing offloading shoe; continues to smoke or have uncontrolled blood glucose control. There is more callus than usual with evidence of bleeding; she denies known trauma. 04/12/18-She is here for evaluation of left great toe ulcer. Despite noncompliance with glycemic control and smoking she continues to make improvement. She continues to wear offloading shoe. The pustule, that was identified last week, to the left fifth toe is resolved. She will follow-up in 2 weeks 05/03/18-she is seen in follow-up evaluation for a left great toe ulcer. She is compliant with offloading, otherwise noncompliant with glycemic control and smoking. She has plateaued and there is minimal improvement noted. We will transition to [facility], replaced the insert to her surgical shoe and she will follow-up in one week 05/10/18- She is here in follow up evaluation for a left great toe ulcer. It appears stable despite measurement change. We will continue with same treatment plan and follow up next  week. 05/24/18-She is seen in follow-up evaluation for a left great toe ulcer. She remains compliant with offloading, has made significant improvement in her diet, decreasing the amount of sugar/soda. She said her recent A1c was 10.9 which is lower than. She did see a diabetic nutritionist/educator yesterday. She continues to smoke. We will continue with the same treatment plan and she'll follow-up next week. 05/31/18- She is seen in follow-up  evaluation for left great toe ulcer. She continues to remain compliant with offloading, continues to make improvement in her diet, increasing her water and decreasing the amount of sugar/soda. She does continue to smoke with no desire to quit. We will apply Prisma to the depth and Hydrofera Blue over. We have not received insurance authorization for oasis. She will follow up next week. 06/07/18-She is seen in follow-up evaluation for left great toe ulcer. It has stalled according to today's measurements although base appears stable. She says she saw a diabetic educator yesterday; her average blood sugars are less than 300 which is an improvement for her. She continues to smoke and states "that's my next step" She continues with water over soda. We will order for xray, culture and reinstate ace wrap compression prior to placing apligraf for next week. She is voicing no complaints or concerns. Her dressing will change to iodoflex over the next week in preparation for apligraf. 06/14/18-She is seen in follow-up evaluation for left great toe ulcer. Plain film x-ray performed last week was negative for osteomyelitis. Wound culture obtained last week grew strep B and OSSA; she is initiated on keflex and cefdinir today; there is erythema to the toe which could be from ace wrap compression, she has a history of wrapping too tight and has has been encouraged to maintain ace wraps that we place today. We will hold off on application of apligraf today, will apply next week after  antibiotic therapy has been initiated. She admits today that she has resumed taking a shower with her foot/toe submerged in water, she has been reminded to keep foot/toe out of the bath water. She will be seen in follow up next week 06/21/18-she is seen in follow-up evaluation for left great toe ulcer. She is tolerating antibiotic therapy with no GI disturbance. The wound is stable. Apligraf was applied today. She has been decreasing her smoking, only had 4 cigarettes yesterday and 1 today. She continues being more compliant in diabetic diet. She will follow-up next week for evaluation of site, if stable will remove at 2 weeks. 06/28/18- She is here in follow up evalution. Apligraf was placed last week, she states the dressing fell off on Tuesday and she was dressing with hydrofera blue. She is healed and will be discharged from the clinic today. She has been instructed to continue with smoking cessation, continue monitoring glucose levels, offloading for an additional 4 weeks and continue with hydrofera blue for additional two weeks for any possible microscopic opening. SIRIAH, TREAT A ([id number]) [id number] Page 5 of 22 Readmission: 08/07/18 on evaluation today patient presents for reevaluation concerning the ulcer of her right great toe. She was previously discharged on 06/28/18 healed. Nonetheless she states that this began to show signs of drainage she subsequently went to her primary care provider. Subsequently an x-ray was performed on 08/01/18 which was negative. The patient was also placed on antibiotics at that time. Fortunately they should have been effective for the infection. Nonetheless she's been experiencing some improvement but still has a lot of drainage coming from the wound itself. 08/14/18 on evaluation today patient's wound actually does show signs of improvement in regard to the erythema at this point. She has completed the antibiotics. With that being  said we did discuss the possibility of placing her in a total contact cast as of today although I think that I may want to give this just a little bit more time to ensure nothing recurrence as far as her  infection is concerned. I do not want to put in the cast and risk infection at that time if things are not completely resolved. With that being said she is gonna require some debridement today. 08/21/18 on evaluation today patient actually appears to be doing okay in regard to her toe ulcer. She's been tolerating the dressing changes without complication. With that being said it does appear that she is ready and in fact I think it's appropriate for [place] to go ahead and initiate the total contact cast today. Nonetheless she will require some sharp debridement to prepare the wound for application. Overall I feel like things have been progressing well but we do need to do something to get this to close more readily. 08/24/18 patient seen today for reevaluation after having had the total contact cast applied on Tuesday. She seems to have done very well the wound appears to be doing great and overall I'm pleased with the progress that she's made. There were no abnormal areas of rubbing from the cast on her lower extremity. 08/30/18 on evaluation today patient actually appears to be completely healed in regard to her plantar toe ulcer. She tells me at this point she's been having a lot of issues with the cast. She almost fell a couple of times the state shall the step of her dog a couple times as well. This is been a very frustrating process for her other nonetheless she has completely healed the wound which is excellent news. Overall there does not appear to be the evidence of infection at this time which is great news. 09/11/18 evaluation today patient presents for follow-up concerning her great toe ulcer on the left which has unfortunately reopened since I last saw her which was only a couple of weeks ago.  Unfortunately she was not able to get in to get the shoe and potentially the AFO that's gonna be necessary due to her left foot drop. She continues with offloading shoe but this is not enough to prevent her from reopening it appears. When we last had her in the total contact cast she did well from a healing standpoint but unfortunately the wound reopened as soon as she came out of the cast within just a couple of weeks. 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 This is also part of the reason why she falls on a regular basis. I just do not believe that is gonna be safe for [place] to reinitiate the total contact cast as last time we had this on she fell 3 times one week which is definitely not normal for her. 09/18/18 upon evaluation today the patient actually appears to be doing about the same in regard to her toe ulcer. She did not contact [facility] as I asked her to even though I had given her the prescription. In fact she actually states that she has no idea where the prescription is. She did apparently call [facility] and they told her that all she needed to do was bring the prescription in order to be able to be seen and work on getting the AFO for her left foot. With all that being said she still does not have an appointment and I'm not sure were things stand that regard. I will give her a new  prescription today in order to contact them to get this set up. 09/25/18 on evaluation today patient actually appears to be doing about the same in regard to her toes ulcer. She does have a small areas which seems to have a lot of callous buildup around the edge of the wound which is going to need sharp debridement today. She still is waiting to be scheduled for evaluation with [facility] for possibility of an AFO. She  states there supposed to call her tomorrow to get this set up. Unfortunately it does appear that her foot specifically the toe area is showing signs of erythema. There does not appear to be any systemic infection which is in these good news. 10/02/18 on evaluation today patient actually appears to be doing about the same in regard to her toe ulcer. This really has not done too well although it's not significantly larger it's also not significantly smaller. She has been tolerating the dressing changes without complication. She actually has her appointment with [facility] and [place] tomorrow to hopefully be measured for obtaining and AFO splint. I think this would be helpful preventing this from reoccurring. We had contemplated starting the cast this week although to be honest I am reluctant to do that as she's been having nausea, vomiting, and seizure activity over the past three days. She has a history of seizures and have been told is nothing that can be done for these. With that being said I do believe that along with the seizures have the nausea vomiting which upon further questioning doesn't seem to be the normal for her and makes me concerned for the possibility of infection or something else going on. I discussed this with the patient and her mother during the office visit today. I do not feel the wound is effective but maybe something else. The responses this was "this just happens to her at times and we don't know why". They did not seem to be interested in going to the hospital to have this checked out further. 10/09/18 on evaluation today patient presents for follow-up concerning her ongoing toe ulcer. She has been tolerating the dressing changes without complication. Fortunately there does not appear to be any evidence of infection which is great news however I do think that the patient would benefit from going ahead for with the total contact cast. She's actually in a wheelchair today she  tells me that she will use her walker if we initiate the cast. I was very specific about the fact that if we were gonna do the cast I wanted to make sure that she was using the walker in order to prevent any falls. She tells me she does not have stairs that she has to traverse on a regular basis at her home. She has not had any seizures since last week again that something that happens to her often she tells me she did talk to [employer] and they said that it may take up to three weeks to get the brace approved for her. Hopefully that will not take that long but nonetheless in the meantime I do think the cast could be of benefit. 10/12/18 on evaluation today patient appears to be doing rather well in regard to her toe ulcer. It's just been a few days and already this is significantly improved both as far as overall appearance and size. Fortunately there's no sign of infection. She is here for her first obligatory cast change. 10/19/18 Seen today for follow up and management of  left great toe ulcer. Wound continues to show improvement. Noted small open area with seroussang drainage with palpation. Denies any increased pain or recent fevers during visit. She will continue calcium alginate with offloading shoe. Denies any questions or concerns during visit. 10/26/18 on evaluation today patient appears to be doing about the same as when I last saw her in regard to her wound bed. Fortunately there does not appear to be any signs of infection. Unfortunately she continues to have a breakdown in regard to the toe region any time that she is not in the cast. It takes almost no time at all for this to happen. Nonetheless she still has not heard anything from the brace being made by [employer] as to when exactly this will be available to her. Fortunately there is no signs of infection at this time. 10/30/18 on evaluation today patient presents for application of the total contact cast as we just received him this morning.  Fortunately we are gonna be able to apply this to her today which is great news. She continues to have no significant pain which is good news. Overall I do feel like things have been improving while she was the cast is when she doesn't have a cast that things get worse. She still has not really heard anything from [place] regarding her brace. 11/02/18 upon evaluation today patient's wound already appears to be doing significantly better which is good news. Fortunately there does not appear to be any signs of infection also good news. Overall I do think the total contact cast as before is helping to heal this area unfortunately it's just not gonna likely keep the area closed and healed without her getting her brace at least. Again the foot drop is a significant issue for her. 11/09/18 on evaluation today patient appears to be doing excellent in regard to her toe ulcer which in fact is completely healed. Fortunately we finally got the situation squared away with the paperwork which was needed to proceed with getting her brace approved by Medicaid. I have filled that out unfortunately that information has been sent to the orthopedic office that I worked at 2 1/2 years ago and not tired Current wound care measures. Fortunately she seems to be doing very well at this time. 11/23/18 on evaluation today patient appears to be doing More Poorly Compared to Last Time I Saw Her. At [facility] She Had Completely Healed. Currently she is continuing to have issues with reopening. She states that she just found out that the brace was approved through Medicaid now she just has to go get measured in order to have this fitted for her and then made. Subsequently she does not have an appointment for this yet that is going to complicate things we obviously cannot put her back in the cast if we do not have everything measured because they're not gonna be able to measure her foot while she is in the [place] ([id number])  [id number] Page 6 of 22 cast. 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 Obviously this is unfortunate and does have me somewhat worried as well. 11/30/18 on evaluation today patient's toe ulcer actually appears to be doing fairly well. The good news is she will be getting her brace in the shoes next week on Wednesday. Hopefully we will be able to get this to heal without having to go  back in the cast however she may need the cast in order to get the wound completely heal and then go from there. Fortunately there's no signs of infection at this time. 12/07/18 on evaluation today patient fortunately did receive her brace and she states she could tell this definitely makes her walk better. With that being said she's been having issues with her toe where she noticed yesterday there was a lot of tissue that was loosing off this appears to be much larger than what it was previous. She also states that her leg has been read putting much across the top of her foot just about the ankle although this seems to be receiving somewhat. The total area is still red and appears to be someone infected as best I can tell. She is previously taken Bactrim and that may be a good option for her today as well. We are gonna see what I wound culture shows as well and I think that this is definitely appropriate. With that being said outside of the culture I still need to initiate something in the interim and that's what I'm gonna go ahead and select Bactrim is a good option for her. 12/14/18 on evaluation today patient appears to be doing better in regard to her left great toe ulcer as compared to last week's evaluation. There's still some erythema although this is significantly improved which is excellent news. Overall I do believe that she is making good progress is still gonna take some time before she  is where I would like her to be from the standpoint of being able to place her back into the total contact cast. Hopefully we will be where we need to be by next week. 12/21/18 on evaluation today patient actually appears to be doing poorly in regard to her toe ulcer. She's been tolerating the dressing changes without complication. Fortunately there's no signs of systemic infection although she does have a lot of drainage from the toe ulcer and this does seem to be causing some issues at this point. She does have erythema on the distal portion of her toe that appears to be likely cellulitis. 12/28/18 on evaluation today patient actually appears to be doing a little better in my pinion in regard to her toe ulcer. With that being said she still does have some evidence of infection at this time and for her culture she had both E. coli as well as enterococcus as organisms noted on evaluation. For that reason I think that though the Keflex likely has treated the E. coli rather well this has really done nothing for the enterococcus. We are going to have to initiate treatment for this specifically. 01/04/19 on evaluation today patient's toe actually appears to be doing better from the standpoint of infection. She currently would like to see about putting the cash back on I think that this is appropriate as long as she takes care of it and keeps it from getting wet. She is gonna have some drainage we can definitely pass this up with Drawtex and alginate to try to prevent as much drainage as possible from causing the problems. With that being said I do want to at least try her with the cast between now and Tuesday. If there any issues we can't continue to use it then I will discontinue the use of the cast at that point. 01/08/19 on evaluation today patient actually appears to be doing very well as far as her foot ulcer specifically the great toe  on the left is concerned. She did have an area of rubbing on the medial  aspect of her left ankle which again is from the cast. Fortunately there's no signs of infection at this point in this appears to be a very slight skin breakdown. The patient tells me she felt it rubbing but didn't think it was that bad. Fortunately there is no signs of active infection at this time which is good news. No fevers, chills, nausea, or vomiting noted at this time. 01/15/19 on evaluation today patient actually appears to be doing well in regard to her toe ulcer. Again as previous she seems to do well and she has the cast on which indicates to me that during the time she doesn't have a cast on she's putting way too much pressure on this region. Obviously I think that's gonna be an issue as with the current national emergency concerning the Covid-19 Virus it has been recommended that we discontinue the use of total contact casting by the chief medical officer of our company, Dr. Simona Huh. The reasoning is that if a patient becomes sick and cannot come into have the cast removed they could not just leave this on for an additional two weeks. Obviously the hospitals also do not want to receive patient's who are sick into the emergency department to potentially contaminate the region and spread the Covid-19 Virus among other sick individuals within the hospital system. Therefore at this point we are suspending the use of total contact cast until the current emergency subsides. This was all discussed with the patient today as well. 01/22/19 on evaluation today patient's wound on her left great toe appears to be doing slightly worse than previously noted last week. She tells me that she has been on this quite a bit in fact she tells me she's been awake for 38 straight hours. This is due to the fact that she's having to care for grandparents because nobody else will. She has been taking care of them for five the last seven days since I've seen her they both have dementia his is from a stroke and  her grandmother's was progressive. Nonetheless she states even her mom who knows her condition and situation has only help two of those days to take care of them she's been taking care of the rest. Fortunately there does not appear to be any signs of active infection in regard to her toe at this point although obviously it doesn't look as good as it did previous. I think this is directly related to her not taking off the pressure and friction by way of taking things easy. Though I completely understand what's going on. 01/29/19 on evaluation today patient's tools are actually appears to be showing some signs of improvement today compared to last week's evaluation as far as not necessarily the overall size of the wound but the fact that she has some new skin growth in between the two ends of the wound opening. Overall I feel like she has done well she states that she had a family member give her what sounds to be a CAM walker boot which has been helpful as well. 02/05/19 on evaluation today patient's wound bed actually appears to be doing significantly better in regard to her overall appearance of the size of the wound. With that being said she is still having an issue with offloading efficiently enough to get this to close. Apparently there is some signs of infection at this point as well unfortunately.  Previously she's done well of Augmentin I really do not see anything that needs to be culture currently but there theme and cellulitis of the foot that I'm seeing I'm gonna go ahead and place her on an antibiotic today to try to help clear this up. 02/12/2019 on evaluation today patient actually appears to be doing poorly in regard to her overall wound status. She tells me she has been using her offloading shoe but actually comes in today wearing her tennis shoe with the AFO brace. Again as I previously discussed with her this is really not sufficient to allow the area to heal appropriately. Nonetheless she  continues to be somewhat noncompliant and I do wonder based on what she has told my nurse in the past as to whether or not a good portion of this noncompliance may be recreational drug and alcohol related. She has had a history of heroin overdose and this was fairly recently in the past couple of months that have been seeing her. Nonetheless overall I feel like her wound looks significantly worse today compared to what it was previous. She still has significant erythema despite the Augmentin I am not sure that this is an appropriate medication for her infection I am also concerned that the infection may have gone down into her bone. 02/19/19 on evaluation today patient actually appears to be doing about the same in regard to her toe ulcer. Unfortunately she continues to show signs of bone exposure and infection at this point. There does not appear to be any evidence of worsening of the infection but I'm also not really sure that it's getting significantly better. She is on the Augmentin which should be sufficient for the Staphylococcus aureus infection that she has at this point. With that being said she may need IV antibiotics to more appropriately treat this. We did have a discussion today about hyperbaric option therapy. 02/28/19 on evaluation today patient actually appears to be doing much worse in regard to the wound on her left great toe as compared to even my previous evaluation last week. Unfortunately this seems to be training in a pretty poor direction. Her toe was actually now starting to angle laterally and I can actually see the entire joint area of the proximal portion of the digit where is the distal portion of the digit again is no longer even in contact with the joint line. Unfortunately there's a lot more necrotic tissue around the edge and the toe appears to be showing signs of becoming gangrenous in my pinion. I'm very concerned about were things stand at this point. She did see  infectious disease and they are planning to send in a prescription for Sivextro for her and apparently this has been approved. With that being said I don't think she should avoid taking this but at the same time I'm not sure that it's gonna be sufficient to save her toe at this point. 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 03/05/19 on evaluation today patient actually appears to be doing quite well in regard to her toe all things considering. She still has bone exposed but there appears to be much less your thing on overall the appearance of the wound and the toe itself is dramatically improved. She still does have  some issues currently obviously with infection she did see vascular as well and there concerned that her blood flow to the toad. For that reason they are setting up for an angiogram next week. 03/14/19 on evaluation today patient appears to be doing very poor in regard to her toe and specifically in regard to the ulceration and the fact that she's starting to notice the toe was leaning even more towards the lateral aspect and the complete joint is visible on the proximal aspect of the joint. Nonetheless KEIONDRA, BROOK[place] A ([id number]) [id number] Page 7 of 22 she's also noted a significant odor and the tip of the toe is turning more dark and necrotic appearing. Overall I think she is getting worse not better as far as this is concerned. For that reason I am recommending at this point that she likely needs to be seen for likely amputation. READMISSION 03/19/2021 This is a patient that we cared for in this clinic for a prolonged period of time in 2019 and 2020 with a left foot and left first toe wound. I believe she ultimately became infected and underwent a left  first toe amputation. 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 Luana Shu. In December 2021 she had an ulcer on her right great toe as well as the fourth and fifth toes. 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 She also had an angiogram at that time and underwent angioplasty of the right anterior tibial artery. In any case she claims that the wound on the right foot is closed I did not look at this today which was probably an oversight although I think that should be done next week. After her surgery she developed a dehiscence but I do not see any follow-up of this. According to Dr. Deborra Medina last review that she was out of the area being cared for by another physician but recently came back to his attention. 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 A culture of this area showed E. coli apparently before she came back to see Dr. Luana Shu she was supposed to be receiving antibiotics but she did not really take them. Nor is she offloading this area at all. Finally her last hemoglobin A1c listed in epic was in March 2022 at 14.1 she says things are a lot better since then although I am not sure. 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 She had this wound at the time. During this admission she had E. coli sepsis a TEE was negative. 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 She is still a heavy cigarette smoker. She has a history of bipolar disorder seizure disorder ABI in our clinic was 1.05 6/1; left midfoot in the setting of a TMA done previously. Round circular wound with a "knuckle"  of protruding tissue. The problem is that the knuckle was not attached to any of the surrounding granulation and this probed proximally widely I removed a large portion of this tissue. This wound goes with considerable undermining laterally. I do not feel any bone there was no purulence but this is a deep wound. 6/8; in spite of the debridement I did last  week. She arrives with a wound looking exactly the same. A protruding "knuckle" of tissue nonadherent to most of the surrounding tissue. There is considerable depth around this from 6-12 o'clock at 2.7 cm and undermining of 1 cm. This does not look overtly infected and the x- ray I did last week was negative for any osseous abnormalities. We have been using silver collagen 6/15; deep tissue culture I did last week showed moderate staph aureus and moderate Pseudomonas. This will definitely require prolonged antibiotic therapy. The pathology on the protuberant area was negative for malignancy fungus etc. 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 We have been using silver collagen. I am going to be prescribing Levaquin for 2 weeks. Her CT scan of the foot is down for 7/5 6/22; CT scan of the foot on 7 5. She says she has hardware in the left leg from her previous fracture. She is on the Levaquin for the deep tissue culture I did that showed methicillin sensitive staph aureus and Pseudomonas. I gave her a 2-week supply and she will have another week. She arrives in clinic today with the same protuberant tissue however this is nonadherent to the tissue surrounding it. I am really at a loss to explain this unless there is underlying deep tissue infection 6/29; patient presents for 1 week follow-up. She has been using collagen to the wound bed. She reports taking her antibiotics as prescribed.She has no complaints or issues today. She denies signs of infection. 7/6; patient presents for one week  followup. She has been using collagen to the wound bed. She states she is taking Levaquin however at times she is not able to keep it down. She denies signs of infection. 7/13; patient presents for 1 week follow-up. She has been using silver alginate to the wound bed. She still has nausea when taking her antibiotics. She denies signs of infection. 7/20; patient presents for 1 week follow-up. She has been using silver alginate with gentamicin cream to the wound bed. She denies any issues and has no complaints today. She denies signs of infection. 7/27; patient presents for 1 week follow-up. She continues to use silver alginate with gentamicin cream to the wound bed. She reports starting her antibiotics. She has no issues or complaints. Overall she reports stability to the wound. 8/3; patient presents for 1 week follow-up. She has been using silver alginate with gentamicin cream to the wound bed. She reports completing all antibiotics. She has no issues or complaints today. She denies signs of infection. 8/17; patient presents for 2-week follow-up. He is to use silver alginate to the wound bed. She has no issues or complaints today. She denies signs of infection. She reports her pain has improved in her foot since last clinic visit 8/24; patient presents for 1 week follow-up. She continues to use silver alginate to the wound bed. She has no issues or complaints. She denies signs of infection. Pain is stable. 9/7; patient presents for follow-up. She missed her last week appointment due to feeling ill. She continues to use silver alginate. She has a new wound to the right lower extremity that is covered in eschar. She states It occurred over the past week and has no idea how it started. She currently denies signs of infection. 9/14; patient presents for follow-up. T the left foot wound she has been using gentamicin cream and silver alginate. T the right lower extremity wound she has o o been keeping  this covered and has not obtain Santyl. 9/21; patient presents for follow-up. She reports using gentamicin cream and silver alginate to the left foot and Santyl to the right lower extremity wound. She has no issues or complaints today. She denies signs of infection. 9/28; patient presents for follow-up. She reports a new wound to her right heel. She states this occurred a few days ago and is progressively gotten worse. She has been trying to clean the area with a Q-tip and Santyl. She reports stability in the other 2 wounds. She has been using gentamicin cream and silver alginate to the left foot and Santyl to the right lower extremity wound. 10/12; patient presents for follow-up. She reports improvement to the wound beds. She is seeing vein and vascular to discuss the potential of a left BKA. She states they are going to do an arteriogram. She continues to use silver alginate with dressing changes to her wounds. 11/2; patient presents for follow-up. She states she has not been doing dressing changes to the wound beds. She states she is not able to offload the areas. She reports chronic pain to her left foot wound. 11/9; patient presents for follow-up. She came in with only socks on. She states she forgot to put on shoes. It is unclear if she is doing any dressing changes. She currently denies systemic signs of infection. 11/16; patient presents for follow-up. She came again only with socks on. She states she does not wear shoes ever. It is unclear if she does dressing changes. She currently denies systemic signs of infection. 11/23; patient presents for follow-up. She wore her shoes today. It still unclear exactly what dressing she is using for each wound but she did states she obtained Dakin's solution and has been using this to the left foot wound. She currently denies signs of infection. 11/30; patient presents for follow-up. She has no issues or complaints today. She currently denies signs of  infection. 12/7; patient presents for follow-up. She has no issues or complaints today. She has been using Hydrofera Blue to the right heel wound and Dakin solution to the left foot wound. Her right anterior leg wound is healed. She currently denies signs of infection. 12/14; patient presents for follow-up. She has been using Hydrofera Blue to the right heel and Dakin's to the left foot wounds. She has no issues or complaints today. She denies signs of infection. 12/21; patient presents for follow-up. She reports using Hydrofera Blue to the right heel and Dakin's to the left foot wound. She denies signs of infection. 12/28; patient presents for follow-up. She continues to use Dakin's to the left foot wound and Hydrofera Blue to the right heel wound. She denies signs of KEELYNN, FURGERSON A ([id number]) [id number] Page 8 of 22 infection. 1/4; patient presents for follow-up. She has no issues or complaints today. She denies signs of infection. 1/11; patient presents for follow-up. It is unclear if she has been dressing these wounds over the past week. She currently denies signs of infection. 1/18; patient presents for follow-up. She states she has been using Dakin's wet-to-dry dressings to the left foot. She has been using Hydrofera Blue to the right foot foot wound. She states that the anterior right leg wound has reopened and draining serous fluid. She denies signs of infection. 1/25; patient presents for follow-up. She has no issues or complaints today. 2/1; patient presents for follow-up. She has no issues or complaints today. She denies signs of infection. 2/8; patient presents for follow-up.  She has lost her surgical shoes. She did not have a dressing to the right heel wound. She currently denies signs of infection. 2/15; patient presents for follow-up. She reports more pain to the right heel today. She denies purulent drainage Or fever/chills 2/22; patient presents for  follow-up. She reports taking clindamycin over the past week. She states that she continues to have pain to her right heel. She reports purulent drainage. Readmission 03/16/2022 Ms. Barbara Ahart is a 47 year old female with a past medical history of type 2 diabetes, osteomyelitis to her feet, chronic systolic heart failure and bipolar disorder that presents to the clinic for bilateral feet wounds and right lower extremity wound. She was last seen in our clinic on 12/15/2021. At that time she had purulent drainage coming out of her right plantar foot and I recommended she go to the ED. She states she went to [facility] and has been there for the past 3 months. I cannot see the records. She states she had OR debridement and was on several weeks of IV antibiotics while inpatient. Since discharge she has not been taking care of the wound beds. She had nothing on her feet other than socks today. She currently denies signs of infection. 5/31; patient presents for follow-up. She has been using Dakin's wet-to-dry dressings to the wound beds on her feet bilaterally and antibiotic ointment to the right anterior leg wound. She had a wound culture done at last clinic visit that showed moderate Pseudomonas aeruginosa sensitive to ciprofloxacin. She currently denies systemic signs of infection. 6/14; patient presents for follow-up. She received Keystone 5 days ago and has been using this on the wound beds. She states that last week she had to go to the hospital because she had increased warmth and erythema to the right foot. She was started on 2 oral antibiotics. She states she has been taking these. She currently denies systemic signs of infection. She has no issues or complaints today. 6/21; patient presents for follow-up. She states she has been using Keystone antibiotics to the wound beds. She has no issues or complaints today. She denies signs of infection. 6/28; patient presents for follow-up.  She has been using Keystone antibiotics to the wound beds. She has no issues or complaints today. 7/12; patient presents for follow-up. Has been using Keystone antibiotics to the wound beds with calcium alginate. She has no issues or complaints today. She never followed up with her orthopedic surgeon who did the OR debridement to the right foot. We discussed the total contact cast for the left foot and patient would like to do this next week. 7/19; patient presents for follow-up. She has been using Keystone antibiotics with calcium alginate to the wound beds. She has no issues or complaints today. Patient is in agreement to do the total contact cast of the left foot today. She knows to return later this week for the obligatory cast change. 05-13-2022 upon evaluation today patient's wound which she has the cast of the left leg actually appears to be doing significantly better. Fortunately I do not see any signs of active infection locally or systemically which is great news and overall I am extremely pleased with where we stand currently. 7/26; patient presents for follow-up. She has a cast in place for the past week. She states it irritated her shin. Other than that she tolerated the cast well. She states she would like a break for 1 week from the cast. We have been using [facility] antibiotic  and Aquacel to both wound beds. She denies signs of infection. 8/2; patient presents for follow-up. She has been using Keystone and Aquacel to the wound beds. She denies any issues and has no complaints. She is agreeable to have the cast placed today for the left leg. 06-03-2022 upon evaluation today patient appears to be doing well with regard to her wound she saw some signs of improvement which is great news. Fortunately I do not see any evidence of active infection locally or systemically at this time which is great news. No fevers, chills, nausea, vomiting, or diarrhea. 8/16; patient presents for follow-up. She  has no issues or complaints today. We have been using Keystone and Aquacel to the wound beds. The left lower extremity is in a total contact cast. She is tolerated this well. 8/23; patient presents for follow-up. She has had the total contact cast on the left leg for the past week. Unfortunately this has rubbed and broken down the skin to the medial foot. She currently denies signs of infection. She has been using Keystone antibiotic to the right plantar foot wound. 8/30; patient presents for follow-up. We have held off on the total contact cast for the left leg for the past week. Her wound on the left foot has improved and the previous surrounding breakdown of skin has epithelialized. She has been using Keystone antibiotic to both wound beds. She has no issues or complaints today. She denies signs of infection. 9/6; patient presents for follow-up. She has ordered her's Keystone antibiotic and this is arriving this week. She has been doing Dakin's wet-to-dry dressings to the wound beds. She denies signs of infection. She is agreeable to the total contact cast today. 9/13; patient presents for follow-up. She states that the cast caused her left leg shin to be sore. She would like to take a break from the cast this week. She has been using Keystone antibiotic to the right plantar foot wound. She denies signs of infection. 9/20; patient presents for follow-up. She has been using Keystone antibiotics to the wound beds with calcium alginate to the right foot wound and Hydrofera Blue to the left foot wound. She is agreeable to having the cast placed today. She has been approved for Apligraf and we will order this for next clinic visit. 9/27; patient presents for follow-up. We have been using Keystone antibiotic with Hydrofera Blue to the left foot wound under a total contact cast. T the right o foot wound she has been using Keystone antibiotic and calcium alginate. She declines a total contact cast today.  Apligraf is available for placement and she would like to proceed with this. 07-28-2022 upon evaluation today patient appears to be doing well currently in regard to her wound. She is actually showing signs of significant improvement which is great news. Fortunately I do not see any evidence of active infection locally nor systemically at this time. She has been seeing Dr. Heber Hanover and to be honest has been doing very well with the cast. Subsequently she comes in today with a cast on and we did reapply that today as well. She did not really want to she try to talk me out of that but I explained that if she wanted to heal this is really the right way to go. Patient voiced understanding. In regard to her right foot this is actually a lot better compared to the last time I saw her which is also great news. 10/11; patient presents for follow-up. Apligraf and  the total contact cast was placed to the left leg at last clinic visit. She states that her right foot wound had Ickes, Angeles A ([id number]) [id number] Page 9 of 22 burning pain to it with the placement of Apligraf to this area. She has been doing [place] over this area. She denies signs of infection including increased warmth, erythema or purulent drainage. 11/1; 3-week follow-up. The patient fortunately did not have a total contact cast or an Apligraf and on the left foot. She has been using Keystone ABD pads and kerlix and her own running shoes She arrives in clinic today with thick callus and a very poor surface on the left foot on the right nonviable skin subcutaneous tissue and a deep probing hole. 11/15; patient missed her last clinic appointment. She states she has not been dressing the wound beds for the past 2 weeks. She states that at she had a new roommate but is now going back to live with her mother. Apparently its been a distracting 2 weeks. Patient currently denies signs of infection. 11/22; patient  presents for follow-up. She states she has been using Keystone antibiotic and Dakin's wet-to-dry dressings to the wound beds. She is agreeable for cast placement today. We had ordered Apligraf however this has not been received by our facility. 11/29; Patient had a total contact cast placed at last clinic visit and she tolerated this well. We were using silver alginate under the cast. Patient's been using Keystone antibiotic with Aquacel to the right plantar foot wound. She has no issues or complaints today. Apligraf is available for placement today. Patient would like to proceed with this. 12/6; patient presents for follow-up. She had Apligraf placed in standard fashion last clinic visit under the total contact cast to the left lower extremity. She has been using Keystone antibiotic and Aquacel to the right plantar foot wound. She has no issues or complaints today. 12/13; patient presents for follow-up. She has finished 5 Apligraf placements. Was told she would not qualify for more. We have been doing a total contact cast to the left lower extremity. She has been using Keystone antibiotic and Aquacel to the right plantar foot wound. She has no issues or complaints today. 12/20; patient presents for follow-up. We have been using Hydrofera Blue with Keystone antibiotic under a total contact cast of the left lower extremity. She reports using Keystone antibiotic and silver alginate to the right heel wound. She has no issues or complaints today. 12/27; patient presents with a healthy wound on the left midfoot. We have Apligraf to apply that to that more also using a total contact cast. On the right we are using Keystone and silver alginate. She is offloading the right heel with a surgical shoealthough by her admission she is on her feet quite a bit 1/3; patient presents for follow-up. Apligraf was placed to the wound beds last clinic visit. She was placed in a total contact cast to the left lower  extremity. She declines a total contact cast today. She states that her mother is in the hospital and she cannot adequately get around with the cast on. 1/10; patient presents for follow-up. She declined the total contact cast at last clinic visit. Both wounds have declined in appearance. She states that she has been on her feet and not offloading the wound beds. She currently denies signs of infection. Electronic Signature(s) Signed: 11/02/2022 2:05:02 PM By: Kalman Shan DO Entered By: Kalman Shan on 11/02/2022 12:17:58 -------------------------------------------------------------------------------- Physical Exam Details Patient  Name: Date of Service: KATHRYNNE, KULINSKI 11/02/2022 11:00 A M Medical Record Number: [id number] Patient Account Number: [id number] Date of Birth/Sex: Treating RN: 23-Mar-1976 (47 y.o. [name] Primary Care Provider: Raelene Bott Other Clinician: Massie Kluver Referring Provider: Treating Provider/Extender: Eddie North in Treatment: 24 Constitutional . Cardiovascular . Psychiatric . Notes Right foot: T the plantar heel there is an incision site with increased depth. T the opening there is Nonviable tissue and granulation tissue. Left foot: T the o o o medial aspect there is an open wound with granulation tissue and nonviable tissue with surrounding callus. No overt signs of infection to any of the wound beds. Electronic Signature(s) Signed: 11/02/2022 2:05:02 PM By: Kalman Shan DO Entered By: Kalman Shan on 11/02/2022 12:18:20 [name] ([id number]) [id number] Page 10 of 22 -------------------------------------------------------------------------------- Physician Orders Details Patient Name: Date of Service: CAYLEE, VLACHOS 11/02/2022 11:00 A M Medical Record Number: [id number] Patient Account Number: [id number] Date of Birth/Sex: Treating RN: 04/03/1976 (47 y.o. [name] Primary Care Provider: Raelene Bott Other Clinician: Massie Kluver Referring Provider: Treating Provider/Extender: Eddie North in Treatment: 20 Verbal / Phone Orders: No Diagnosis Coding Follow-up Appointments Return Appointment in 1 week. Nurse Visit as needed Bathing/ Shower/ Hygiene May shower with wound dressing protected with water repellent cover or cast protector. No tub bath. Anesthetic (Use 'Patient Medications' Section for Anesthetic Order Entry) Lidocaine applied to wound bed Cellular or Tissue Based Products Cellular or Tissue Based Product Type: - Apligraf applied Cellular or Tissue Based Product applied to wound bed; including contact layer, fixation with steri-strips, dry gauze and cover dressing. (DO NOT REMOVE). Edema Control - Lymphedema / Segmental Compressive Device / Other Elevate, Exercise Daily and A void Standing for Long Periods of Time. Elevate legs to the level of the heart and pump ankles as often as possible Elevate leg(s) parallel to the floor when sitting. Off-Loading Total Contact Cast to Left Lower Extremity Open toe surgical shoe - Heel off loader - Right foot Other: - keep pressure off of feet. Additional Orders / Instructions Follow Nutritious Diet and Increase Protein Intake Other: - Stay off of foot, wheelchair only Medications-Please add to medication list. Wound #11 Right,Plantar Foot Keystone Compound Wound Treatment Wound #11 - Foot Wound Laterality: Plantar, Right Cleanser: Wound Cleanser 1 x Per Day/30 Days Discharge Instructions: Wash your hands with soap and water. Remove old dressing, discard into plastic bag and place into trash. Cleanse the wound with Wound Cleanser prior to applying a clean dressing using gauze sponges, not tissues or cotton balls. Do not scrub or use excessive force. Pat dry using gauze sponges, not tissue or cotton balls. Topical: keystone gel 1 x Per Day/30 Days Prim  Dressing: Aquacel Extra Hydrofiber Dressing, 2x2 (in/in) ary 1 x Per Day/30 Days Secondary Dressing: ABD Pad 5x9 (in/in) (Generic) 1 x Per Day/30 Days Discharge Instructions: Cover with ABD pad Secured With: Medipore T - 74M Medipore H Soft Cloth Surgical T ape ape, 2x2 (in/yd) 1 x Per Day/30 Days Secured With: [employer] Sterile or Non-Sterile 6-ply 4.5x4 (yd/yd) 1 x Per Day/30 Days Discharge Instructions: Apply Kerlix as directed ADDALIE, CALLES A ([id number]) [id number] Page 11 of 22 Wound #12 - Foot Wound Laterality: Plantar, Left, Medial Cleanser: Wound Cleanser 1 x Per Day/30 Days Discharge Instructions: Wash your hands with soap and water. Remove old dressing, discard into plastic bag and place into trash. Cleanse the wound with Wound Cleanser  prior to applying a clean dressing using gauze sponges, not tissues or cotton balls. Do not scrub or use excessive force. Pat dry using gauze sponges, not tissue or cotton balls. Peri-Wound Care: Triamcinolone Acetonide Cream, 0.1%, 15 (g) tube 1 x Per Day/30 Days Discharge Instructions: Apply as directed to irritated areas. Secondary Dressing: ABD Pad 5x9 (in/in) 1 x Per Day/30 Days Discharge Instructions: Cover with ABD pad Secondary Dressing: Foam Dressing, 4x4 (in/in) 1 x Per Day/30 Days Discharge Instructions: used for padding ankle and forefoot. Electronic Signature(s) Signed: 11/02/2022 2:05:02 PM By: Kalman Shan DO Signed: 11/03/2022 3:17:21 PM By: Gretta Cool, BSN, RN, CWS, [name] RN, BSN Entered By: Gretta Cool, BSN, RN, CWS, [name] on 11/02/2022 13:05:06 -------------------------------------------------------------------------------- Problem List Details Patient Name: Date of Service: [name] 11/02/2022 11:00 A M Medical Record Number: [id number] Patient Account Number: [id number] Date of Birth/Sex: Treating RN: November 07, 1975 (47 y.o. 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 03/16/2022 No Yes E11.621 Type 2 diabetes mellitus with foot ulcer 03/16/2022 No Yes E11.42 Type 2 diabetes mellitus with diabetic polyneuropathy 03/16/2022 No Yes L97.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 03/16/2022 No Yes of skin Inactive Problems Niehoff, Geselle A ([id number]) [id number] Page 12 of 22 Resolved Problems Electronic Signature(s) Signed: 11/02/2022 2:05:02 PM By: Kalman Shan DO Entered By: Kalman Shan on 11/02/2022 12:16:47 -------------------------------------------------------------------------------- Progress Note Details Patient Name: Date of Service: [name] 11/02/2022 11:00 A M Medical Record Number: [id number] Patient Account Number: [id number] Date of Birth/Sex: Treating RN: 07-Feb-1976 (47 y.o. [name] Primary Care Provider: Raelene Bott Other Clinician: Massie Kluver Referring Provider: Treating Provider/Extender: Eddie North in Treatment: 55 Subjective Chief Complaint Information obtained from Patient 03/19/2021; patient referred by Dr. Luana Shu who has been looking after her left foot for quite a period of time for review of a nonhealing area in the left midfoot 03/12/2022; bilateral feet wounds and right lower extremity wound. History of Present Illness (HPI) 01/18/18-She is here for initial evaluation of the left great toe ulcer. She is a poor historian in regards to timeframe in detail. She states approximately 4 weeks ago she lacerated her toe on something  in the house. She followed up with her primary care who placed her on Bactrim and ultimately a second dose of Bactrim prior to coming to wound clinic. She states she has been treating the toe with peroxide, Betadine and a Band-Aid. She did not check her blood sugar this morning but checked it yesterday morning it was 327; she is unaware of a recent A1c and there are no current records. She saw Dr. she would've orthopedics last week for an old injury to the left ankle, she states he did not see her toe, nor did she bring it to his attention. She smokes approximately 1 pack cigarettes a day. Her social situation is concerning, she arrives this morning with her mother who appears extremely intoxicated/under the influence; her mother was asked to leave the room and be monitored by the patient's grandmother. The patient's aunt then accompanied the patient and the room throughout the rest of the appointment. We  had a lengthy discussion regarding the deleterious effects of uncontrolled hyperglycemia and smoking as it relates to wound healing and overall health. She was strongly encouraged to decrease her smoking and get her diabetes under better control. She states she is currently on a diet and has cut down her [facility] consumption. The left toe is erythematous, macerated and slightly edematous with malodor present. The edema in her left foot is below her baseline, there is no erythema streaking. We will treat her with Santyl, doxycycline; we have ordered and xray, culture and provided a Peg assist surgical shoe and cultured the wound. 01/25/18-She is here in follow-up evaluation for a left great toe ulcer and presents with an abscess to her suprapubic area. She states her blood sugars remain elevated, feeling "sick" and if levels are below 250, but she is trying. She has made no attempt to decrease her smoking stating that we "can't take away her food in her cigarettes". She has been compliant with  offloading using the PEG assist you. She is using Santyl daily. the culture obtained last week grew staph aureus and Enterococcus faecalis; continues on the doxycycline and Augmentin was added on Monday. The suprapubic area has erythema, no femoral variation, purple discoloration, minimal induration, was accessed with a cotton tip applicator with sanguinopurulent drainage, this was cultured, I suspect the current antibiotic treatment will cover and we will not add anything to her current treatment plan. She was advised to go to urgent care or ER with any change in redness, induration or fever. 02/01/18-She is here in follow-up evaluation for left great toe ulcers and a new abdominal abscess from last week. She was able to use packing until earlier this week, where she "forgot it was there". She states she was feeling ill with GI symptoms last week and was not taking her antibiotic. She states her glucose levels have been predominantly less than 200, with occasional levels between 200-250. She thinks this was contributing to her GI symptoms as they have resolved without intervention. There continues to be significant laceration to left toe, otherwise it clinically looks stable/improved. There is now less superficial opening to the lateral aspect of the great toe that was residual blister. We will transition to [facility] to all wounds, she will continue her Augmentin. If there is no change or deterioration next week for reculture. 02/08/18-She is here in follow-up evaluation for left great toe ulcer and abdominal ulcer. There is an improvement in both wounds. She has been wrapping her left toe with coban, not by our direction, which has created an area of discoloration to the medial aspect; she has been advised to NOT use coban secondary to her neuropathy. She states her glucose levels have been high over this last week ranging from 200-350, she continues to smoke. She admits to being less compliant  with her offloading shoe. We will continue with same treatment plan and she will follow-up next week. 02/15/18-She is here in follow-up evaluation for left great toe ulcer and abdominal ulcer. The abdominal ulcer is epithelialized. The left great toe ulcer is improved and all injury from last week using the Coban wrap is resolved, the lateral ulcer is healed. She admits to noncompliance with wearing offloading shoe and admits to glucose levels being greater than 300 most of the week. She continues to smoke and expresses no desire to quit. There is one area medially that probes deeper than it has historically, erythema to the toe and dorsal foot has consistently waxed  and waned. There is no overt signs of cellulitis or infection but we will culture the wound for any occult infection given the new area of depth and erythema. We will hold off on sensitivities for initiation of antibiotic therapy. 02/22/18-She is here in follow up evaluation for left great toe ulcer. There is overall significant improvement in both wound appearance, erythema and edema with changes made last week. She was not initiated on antibiotic therapy. Culture obtained last week showed oxacillin sensitive staph aureus, sensitive to clindamycin. Clindamycin has been called into the pharmacy but she has been instructed to hold off on initiation secondary to overall clinical improvement and her history of antibiotic intolerance. She has been instructed to contact the clinic with any noted changes/deterioration and the wound, erythema, edema and/or pain. She will follow-up next week. She continues to smoke and her glucose levels remain elevated >250; she admits to compliance with offloading shoe 03/01/18 on evaluation today patient appears to be doing fairly well in regard to her left first toe ulcer. She has been tolerating the dressing changes with the [facility] Dressing without complication and overall this has definitely showed signs  of improvement according to records as well is what the patient tells me today. I'm very pleased in that regard. She is having no pain today 03/08/18 She is here for follow up evaluation of a left great toe ulcer. She remains non-compliant with glucose control and smoking cessation; glucose levels consistently >200. She states that she got new shoe inserts/peg assist. She admits to compliance with offloading. Since my last evaluation there is significant Wolfley, Lafaye A ([id number]) [id number] Page 13 of 22 improvement. We will switch to prisma at this time and she will follow up next week. She is noted to be tachycardic at this appointment, heart rate 120s; she has a history of heart rate 70-130 according to our records. She admits to extreme agitation r/t personal issues; she was advised to monitor her heartrate and contact her physician if it does not return to a more normal range (<100). She takes cardizem twice daily. 03/15/18-She is here in follow-up evaluation for left great toe ulcer. She remains noncompliant with glucose control and smoking cessation. She admits to compliance with wearing offloading shoe. The ulcer is improved/stable and we will continue with the same treatment plan and she will follow-up next week 03/22/18-She is here for evaluation for left great toe ulcer. There continues to be significant improvement despite recurrent hyperglycemia (over 500 yesterday) and she continues to smoke. She has been compliant with offloading and we will continue with same treatment plan and she will follow-up next week. 03/29/18-She is here for evaluation for left great toe ulcer. Despite continuing to smoke and uncontrolled diabetes she continues to improve. She is compliant with offloading shoe. We will continue with the same treatment plan and she will follow-up next week 04/05/18- She is here in follow up evaluation for a left great toe ulcer; she presents with small  pustule to left fifth toe (resembles ant bite). She admits to compliance with wearing offloading shoe; continues to smoke or have uncontrolled blood glucose control. There is more callus than usual with evidence of bleeding; she denies known trauma. 04/12/18-She is here for evaluation of left great toe ulcer. Despite noncompliance with glycemic control and smoking she continues to make improvement. She continues to wear offloading shoe. The pustule, that was identified last week, to the left fifth toe is resolved. She will follow-up in 2 weeks 05/03/18-she  is seen in follow-up evaluation for a left great toe ulcer. She is compliant with offloading, otherwise noncompliant with glycemic control and smoking. She has plateaued and there is minimal improvement noted. We will transition to [facility], replaced the insert to her surgical shoe and she will follow-up in one week 05/10/18- She is here in follow up evaluation for a left great toe ulcer. It appears stable despite measurement change. We will continue with same treatment plan and follow up next week. 05/24/18-She is seen in follow-up evaluation for a left great toe ulcer. She remains compliant with offloading, has made significant improvement in her diet, decreasing the amount of sugar/soda. She said her recent A1c was 10.9 which is lower than. She did see a diabetic nutritionist/educator yesterday. She continues to smoke. We will continue with the same treatment plan and she'll follow-up next week. 05/31/18- She is seen in follow-up evaluation for left great toe ulcer. She continues to remain compliant with offloading, continues to make improvement in her diet, increasing her water and decreasing the amount of sugar/soda. She does continue to smoke with no desire to quit. We will apply Prisma to the depth and Hydrofera Blue over. We have not received insurance authorization for oasis. She will follow up next week. 06/07/18-She is seen in follow-up  evaluation for left great toe ulcer. It has stalled according to today's measurements although base appears stable. She says she saw a diabetic educator yesterday; her average blood sugars are less than 300 which is an improvement for her. She continues to smoke and states "that's my next step" She continues with water over soda. We will order for xray, culture and reinstate ace wrap compression prior to placing apligraf for next week. She is voicing no complaints or concerns. Her dressing will change to iodoflex over the next week in preparation for apligraf. 06/14/18-She is seen in follow-up evaluation for left great toe ulcer. Plain film x-ray performed last week was negative for osteomyelitis. Wound culture obtained last week grew strep B and OSSA; she is initiated on keflex and cefdinir today; there is erythema to the toe which could be from ace wrap compression, she has a history of wrapping too tight and has has been encouraged to maintain ace wraps that we place today. We will hold off on application of apligraf today, will apply next week after antibiotic therapy has been initiated. She admits today that she has resumed taking a shower with her foot/toe submerged in water, she has been reminded to keep foot/toe out of the bath water. She will be seen in follow up next week 06/21/18-she is seen in follow-up evaluation for left great toe ulcer. She is tolerating antibiotic therapy with no GI disturbance. The wound is stable. Apligraf was applied today. She has been decreasing her smoking, only had 4 cigarettes yesterday and 1 today. She continues being more compliant in diabetic diet. She will follow-up next week for evaluation of site, if stable will remove at 2 weeks. 06/28/18- She is here in follow up evalution. Apligraf was placed last week, she states the dressing fell off on Tuesday and she was dressing with hydrofera blue. She is healed and will be discharged from the clinic today. She has  been instructed to continue with smoking cessation, continue monitoring glucose levels, offloading for an additional 4 weeks and continue with hydrofera blue for additional two weeks for any possible microscopic opening. Readmission: 08/07/18 on evaluation today patient presents for reevaluation concerning the ulcer of her  right great toe. She was previously discharged on 06/28/18 healed. Nonetheless she states that this began to show signs of drainage she subsequently went to her primary care provider. Subsequently an x-ray was performed on 08/01/18 which was negative. The patient was also placed on antibiotics at that time. Fortunately they should have been effective for the infection. Nonetheless she's been experiencing some improvement but still has a lot of drainage coming from the wound itself. 08/14/18 on evaluation today patient's wound actually does show signs of improvement in regard to the erythema at this point. She has completed the antibiotics. With that being said we did discuss the possibility of placing her in a total contact cast as of today although I think that I may want to give this just a little bit more time to ensure nothing recurrence as far as her infection is concerned. I do not want to put in the cast and risk infection at that time if things are not completely resolved. With that being said she is gonna require some debridement today. 08/21/18 on evaluation today patient actually appears to be doing okay in regard to her toe ulcer. She's been tolerating the dressing changes without complication. With that being said it does appear that she is ready and in fact I think it's appropriate for [place] to go ahead and initiate the total contact cast today. Nonetheless she will require some sharp debridement to prepare the wound for application. Overall I feel like things have been progressing well but we do need to do something to get this to close more readily. 08/24/18 patient seen  today for reevaluation after having had the total contact cast applied on Tuesday. She seems to have done very well the wound appears to be doing great and overall I'm pleased with the progress that she's made. There were no abnormal areas of rubbing from the cast on her lower extremity. 08/30/18 on evaluation today patient actually appears to be completely healed in regard to her plantar toe ulcer. She tells me at this point she's been having a lot of issues with the cast. She almost fell a couple of times the state shall the step of her dog a couple times as well. This is been a very frustrating process for her other nonetheless she has completely healed the wound which is excellent news. Overall there does not appear to be the evidence of infection at this time which is great news. 09/11/18 evaluation today patient presents for follow-up concerning her great toe ulcer on the left which has unfortunately reopened since I last saw her which was only a couple of weeks ago. Unfortunately she was not able to get in to get the shoe and potentially the AFO that's gonna be necessary due to her left foot drop. She continues with offloading shoe but this is not enough to prevent her from reopening it appears. When we last had her in the total contact cast she did well from a healing standpoint but unfortunately the wound reopened as soon as she came out of the cast within just a couple of weeks. 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 This is also part of the reason why she falls on a regular basis. I just do not believe that is gonna be  safe for [place] to reinitiate the total contact cast as last time we had this on she fell 3 times one week which is definitely not normal for her. 09/18/18 upon evaluation today the  patient actually appears to be doing about the same in regard to her toe ulcer. She did not contact [facility] as I asked her to even though I had given her the prescription. In fact she actually states that she has no idea where the prescription is. She did apparently call [facility] and they told her that all she needed to do was bring the prescription in order to be able to be seen and work on getting the AFO for her left foot. With all that being said she still does not have an appointment and I'm not sure were things stand that regard. I will give her a new prescription today in order to contact them to get this set up. 09/25/18 on evaluation today patient actually appears to be doing about the same in regard to her toes ulcer. She does have a small areas which seems to have a lot of callous buildup around the edge of the wound which is going to need sharp debridement today. She still is waiting to be scheduled for evaluation with [facility] for possibility of an AFO. She states there supposed to call her tomorrow to get this set up. Unfortunately it does appear that her foot specifically the toe area is showing signs of erythema. There does not appear to be any systemic infection which is in these good news. 10/02/18 on evaluation today patient actually appears to be doing about the same in regard to her toe ulcer. This really has not done too well although it's not significantly larger it's also not significantly smaller. She has been tolerating the dressing changes without complication. She actually has her appointment with [facility] and [place] tomorrow to hopefully be measured for obtaining and AFO splint. I think this would be helpful preventing this from reoccurring. We had contemplated starting the cast this week although to be honest I am reluctant to do that as she's been having nausea, vomiting, and seizure activity over the past three days. She has a history of seizures and have been told is  nothing that can be done for these. With that being said I do believe that along with the seizures have the nausea vomiting which upon further questioning doesn't seem to be the normal for her and makes me concerned for the possibility of KEALIE, BARRIE A ([id number]) [id number] Page 14 of 22 infection or something else going on. I discussed this with the patient and her mother during the office visit today. I do not feel the wound is effective but maybe something else. The responses this was "this just happens to her at times and we don't know why". They did not seem to be interested in going to the hospital to have this checked out further. 10/09/18 on evaluation today patient presents for follow-up concerning her ongoing toe ulcer. She has been tolerating the dressing changes without complication. Fortunately there does not appear to be any evidence of infection which is great news however I do think that the patient would benefit from going ahead for with the total contact cast. She's actually in a wheelchair today she tells me that she will use her walker if we initiate the cast. I was very specific about the fact that if we were gonna do the cast I wanted to make sure that she was  using the walker in order to prevent any falls. She tells me she does not have stairs that she has to traverse on a regular basis at her home. She has not had any seizures since last week again that something that happens to her often she tells me she did talk to [employer] and they said that it may take up to three weeks to get the brace approved for her. Hopefully that will not take that long but nonetheless in the meantime I do think the cast could be of benefit. 10/12/18 on evaluation today patient appears to be doing rather well in regard to her toe ulcer. It's just been a few days and already this is significantly improved both as far as overall appearance and size. Fortunately there's no  sign of infection. She is here for her first obligatory cast change. 10/19/18 Seen today for follow up and management of left great toe ulcer. Wound continues to show improvement. Noted small open area with seroussang drainage with palpation. Denies any increased pain or recent fevers during visit. She will continue calcium alginate with offloading shoe. Denies any questions or concerns during visit. 10/26/18 on evaluation today patient appears to be doing about the same as when I last saw her in regard to her wound bed. Fortunately there does not appear to be any signs of infection. Unfortunately she continues to have a breakdown in regard to the toe region any time that she is not in the cast. It takes almost no time at all for this to happen. Nonetheless she still has not heard anything from the brace being made by [employer] as to when exactly this will be available to her. Fortunately there is no signs of infection at this time. 10/30/18 on evaluation today patient presents for application of the total contact cast as we just received him this morning. Fortunately we are gonna be able to apply this to her today which is great news. She continues to have no significant pain which is good news. Overall I do feel like things have been improving while she was the cast is when she doesn't have a cast that things get worse. She still has not really heard anything from [place] regarding her brace. 11/02/18 upon evaluation today patient's wound already appears to be doing significantly better which is good news. Fortunately there does not appear to be any signs of infection also good news. Overall I do think the total contact cast as before is helping to heal this area unfortunately it's just not gonna likely keep the area closed and healed without her getting her brace at least. Again the foot drop is a significant issue for her. 11/09/18 on evaluation today patient appears to be doing excellent in regard to her  toe ulcer which in fact is completely healed. Fortunately we finally got the situation squared away with the paperwork which was needed to proceed with getting her brace approved by Medicaid. I have filled that out unfortunately that information has been sent to the orthopedic office that I worked at 2 1/2 years ago and not tired Current wound care measures. Fortunately she seems to be doing very well at this time. 11/23/18 on evaluation today patient appears to be doing More Poorly Compared to Last Time I Saw Her. At [facility] She Had Completely Healed. Currently she is continuing to have issues with reopening. She states that she just found out that the brace was approved through [place] now she just has to go  get measured in order to have this fitted for her and then made. Subsequently she does not have an appointment for this yet that is going to complicate things we obviously cannot put her back in the cast if we do not have everything measured because they're not gonna be able to measure her foot while she is in the cast. 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 Obviously this is unfortunate and does have me somewhat worried as well. 11/30/18 on evaluation today patient's toe ulcer actually appears to be doing fairly well. The good news is she will be getting her brace in the shoes next week on Wednesday. Hopefully we will be able to get this to heal without having to go back in the cast however she may need the cast in order to get the wound completely heal and then go from there. Fortunately there's no signs of infection at this time. 12/07/18 on evaluation today patient fortunately did receive her brace and she states she could tell this definitely makes her walk better. With that being said she's been having issues with her toe where she noticed yesterday there was a lot of tissue that was  loosing off this appears to be much larger than what it was previous. She also states that her leg has been read putting much across the top of her foot just about the ankle although this seems to be receiving somewhat. The total area is still red and appears to be someone infected as best I can tell. She is previously taken Bactrim and that may be a good option for her today as well. We are gonna see what I wound culture shows as well and I think that this is definitely appropriate. With that being said outside of the culture I still need to initiate something in the interim and that's what I'm gonna go ahead and select Bactrim is a good option for her. 12/14/18 on evaluation today patient appears to be doing better in regard to her left great toe ulcer as compared to last week's evaluation. There's still some erythema although this is significantly improved which is excellent news. Overall I do believe that she is making good progress is still gonna take some time before she is where I would like her to be from the standpoint of being able to place her back into the total contact cast. Hopefully we will be where we need to be by next week. 12/21/18 on evaluation today patient actually appears to be doing poorly in regard to her toe ulcer. She's been tolerating the dressing changes without complication. Fortunately there's no signs of systemic infection although she does have a lot of drainage from the toe ulcer and this does seem to be causing some issues at this point. She does have erythema on the distal portion of her toe that appears to be likely cellulitis. 12/28/18 on evaluation today patient actually appears to be doing a little better in my pinion in regard to her toe ulcer. With that being said she still does have some evidence of infection at this time and for her culture she had both E. coli as well as enterococcus as organisms noted on evaluation. For that reason I think that though the  Keflex likely has treated the E. coli rather well this has really done nothing for the enterococcus. We are going to have to  initiate treatment for this specifically. 01/04/19 on evaluation today patient's toe actually appears to be doing better from the standpoint of infection. She currently would like to see about putting the cash back on I think that this is appropriate as long as she takes care of it and keeps it from getting wet. She is gonna have some drainage we can definitely pass this up with Drawtex and alginate to try to prevent as much drainage as possible from causing the problems. With that being said I do want to at least try her with the cast between now and Tuesday. If there any issues we can't continue to use it then I will discontinue the use of the cast at that point. 01/08/19 on evaluation today patient actually appears to be doing very well as far as her foot ulcer specifically the great toe on the left is concerned. She did have an area of rubbing on the medial aspect of her left ankle which again is from the cast. Fortunately there's no signs of infection at this point in this appears to be a very slight skin breakdown. The patient tells me she felt it rubbing but didn't think it was that bad. Fortunately there is no signs of active infection at this time which is good news. No fevers, chills, nausea, or vomiting noted at this time. 01/15/19 on evaluation today patient actually appears to be doing well in regard to her toe ulcer. Again as previous she seems to do well and she has the cast on which indicates to me that during the time she doesn't have a cast on she's putting way too much pressure on this region. Obviously I think that's gonna be an issue as with the current national emergency concerning the Covid-19 Virus it has been recommended that we discontinue the use of total contact casting by the chief medical officer of our company, Dr. Simona Huh. The reasoning is that if a  patient becomes sick and cannot come into have the cast removed they could not just leave this on for an additional two weeks. Obviously the hospitals also do not want to receive patient's who are sick into the emergency department to potentially contaminate the region and spread the Covid-19 Virus among other sick individuals within the hospital system. Therefore at this point we are suspending the use of total contact cast until the current emergency subsides. This was all discussed with the patient today as well. 01/22/19 on evaluation today patient's wound on her left great toe appears to be doing slightly worse than previously noted last week. She tells me that she has been on this quite a bit in fact she tells me she's been awake for 38 straight hours. This is due to the fact that she's having to care for grandparents because nobody else will. She has been taking care of them for five the last seven days since I've seen her they both have dementia his is from a stroke and her grandmother's was progressive. Nonetheless she states even her mom who knows her condition and situation has only help two of those days to take care of them she's been taking care of the rest. Fortunately there does not appear to be any signs of active infection in regard to her toe at this point although FONTELLA, SHAN A ([id number]) [id number] Page 15 of 22 obviously it doesn't look as good as it did previous. I think this is directly related to her not taking off the pressure and friction by  way of taking things easy. Though I completely understand what's going on. 01/29/19 on evaluation today patient's tools are actually appears to be showing some signs of improvement today compared to last week's evaluation as far as not necessarily the overall size of the wound but the fact that she has some new skin growth in between the two ends of the wound opening. Overall I feel like she has done well she  states that she had a family member give her what sounds to be a CAM walker boot which has been helpful as well. 02/05/19 on evaluation today patient's wound bed actually appears to be doing significantly better in regard to her overall appearance of the size of the wound. With that being said she is still having an issue with offloading efficiently enough to get this to close. Apparently there is some signs of infection at this point as well unfortunately. Previously she's done well of Augmentin I really do not see anything that needs to be culture currently but there theme and cellulitis of the foot that I'm seeing I'm gonna go ahead and place her on an antibiotic today to try to help clear this up. 02/12/2019 on evaluation today patient actually appears to be doing poorly in regard to her overall wound status. She tells me she has been using her offloading shoe but actually comes in today wearing her tennis shoe with the AFO brace. Again as I previously discussed with her this is really not sufficient to allow the area to heal appropriately. Nonetheless she continues to be somewhat noncompliant and I do wonder based on what she has told my nurse in the past as to whether or not a good portion of this noncompliance may be recreational drug and alcohol related. She has had a history of heroin overdose and this was fairly recently in the past couple of months that have been seeing her. Nonetheless overall I feel like her wound looks significantly worse today compared to what it was previous. She still has significant erythema despite the Augmentin I am not sure that this is an appropriate medication for her infection I am also concerned that the infection may have gone down into her bone. 02/19/19 on evaluation today patient actually appears to be doing about the same in regard to her toe ulcer. Unfortunately she continues to show signs of bone exposure and infection at this point. There does not appear to  be any evidence of worsening of the infection but I'm also not really sure that it's getting significantly better. She is on the Augmentin which should be sufficient for the Staphylococcus aureus infection that she has at this point. With that being said she may need IV antibiotics to more appropriately treat this. We did have a discussion today about hyperbaric option therapy. 02/28/19 on evaluation today patient actually appears to be doing much worse in regard to the wound on her left great toe as compared to even my previous evaluation last week. Unfortunately this seems to be training in a pretty poor direction. Her toe was actually now starting to angle laterally and I can actually see the entire joint area of the proximal portion of the digit where is the distal portion of the digit again is no longer even in contact with the joint line. Unfortunately there's a lot more necrotic tissue around the edge and the toe appears to be showing signs of becoming gangrenous in my pinion. I'm very concerned about were things stand at this point.  She did see infectious disease and they are planning to send in a prescription for Sivextro for her and apparently this has been approved. With that being said I don't think she should avoid taking this but at the same time I'm not sure that it's gonna be sufficient to save her toe at this point. 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 03/05/19 on evaluation today patient actually appears to be doing quite well in regard to her toe all things considering. She still has bone exposed but there appears to be much less your thing on overall the appearance of the wound and the toe itself is dramatically improved. She still does have some issues currently obviously with  infection she did see vascular as well and there concerned that her blood flow to the toad. For that reason they are setting up for an angiogram next week. 03/14/19 on evaluation today patient appears to be doing very poor in regard to her toe and specifically in regard to the ulceration and the fact that she's starting to notice the toe was leaning even more towards the lateral aspect and the complete joint is visible on the proximal aspect of the joint. Nonetheless she's also noted a significant odor and the tip of the toe is turning more dark and necrotic appearing. Overall I think she is getting worse not better as far as this is concerned. For that reason I am recommending at this point that she likely needs to be seen for likely amputation. READMISSION 03/19/2021 This is a patient that we cared for in this clinic for a prolonged period of time in 2019 and 2020 with a left foot and left first toe wound. 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 Luana Shu. In December 2021 she had an ulcer on her right great toe as well as the fourth and fifth toes. 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 She also had an angiogram at that time and underwent angioplasty of the right anterior tibial artery. In any case she claims that the wound on the right foot is closed I did not look at this today which was probably an oversight although I think that should be done next week. After her surgery she developed a dehiscence but I do not see any follow-up of this. According to Dr. Deborra Medina last review that she was out of the area being cared for by another physician but recently came back to his attention. 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 A culture of this area showed E. coli apparently before she came back to see Dr. Luana Shu she was supposed to be  receiving antibiotics but she did not really take them. Nor is she offloading this area at all. Finally her last hemoglobin A1c listed in epic was in March 2022 at 14.1 she says things are a lot better since then although I am not sure. 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 She had this wound at the time. During this admission she had E. coli sepsis a TEE was negative. Past  medical history is extensive and includes type 2 diabetes with peripheral neuropathy cardiomyopathy with an ejection fraction of 33%, hypertension, hyperlipidemia chronic renal failure stage III history of substance abuse with cocaine although she claims to be clean now verified by her mother. She is still a heavy cigarette smoker. She has a history of bipolar disorder seizure disorder ABI in our clinic was 1.05 6/1; left midfoot in the setting of a TMA done previously. Round circular wound with a "knuckle" of protruding tissue. The problem is that the knuckle was not attached to any of the surrounding granulation and this probed proximally widely I removed a large portion of this tissue. This wound goes with considerable undermining laterally. I do not feel any bone there was no purulence but this is a deep wound. 6/8; in spite of the debridement I did last week. She arrives with a wound looking exactly the same. A protruding "knuckle" of tissue nonadherent to most of the surrounding tissue. There is considerable depth around this from 6-12 o'clock at 2.7 cm and undermining of 1 cm. This does not look overtly infected and the x- ray I did last week was negative for any osseous abnormalities. We have been using silver collagen 6/15; deep tissue culture I did last week showed moderate staph aureus and moderate Pseudomonas. This will definitely require prolonged antibiotic therapy. The pathology on the protuberant area was negative for  malignancy fungus etc. 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 We have been using silver collagen. I am going to be prescribing Levaquin for 2 weeks. Her CT scan of the foot is down for 7/5 6/22; CT scan of the foot on 7 5. She says she has hardware in the left leg from her previous fracture. She is on the Levaquin for the deep tissue culture I did that showed methicillin sensitive staph aureus and Pseudomonas. I gave her a 2-week supply and she will have another week. She arrives in clinic today with the same protuberant tissue however this is nonadherent to the tissue surrounding it. I am really at a loss to explain this unless there is underlying deep tissue infection 6/29; patient presents for 1 week follow-up. She has been using collagen to the wound bed. She reports taking her antibiotics as prescribed.She has no complaints or issues today. She denies signs of infection. 7/6; patient presents for one week followup. She has been using collagen to the wound bed. She states she is taking Levaquin however at times she is not able to keep it down. She denies signs of infection. 7/13; patient presents for 1 week follow-up. She has been using silver alginate to the wound bed. She still has nausea when taking her antibiotics. She denies signs of infection. 7/20; patient presents for 1 week follow-up. She has been using silver alginate with gentamicin cream to the wound bed. She denies any issues and has no complaints today. She denies signs of infection. 7/27; patient presents for 1 week follow-up. She continues to use silver alginate with gentamicin cream to the wound bed. She reports starting her antibiotics. She has no issues or complaints. Overall she reports stability to the wound. 8/3; patient presents for 1 week follow-up. She has been using silver alginate with gentamicin cream to the wound bed. She reports completing all  antibiotics. She has no issues or complaints today. She denies signs of infection. SHONETTE, RHAMES A ([id number]) [id number] Page 16 of 22 8/17; patient presents for 2-week  follow-up. He is to use silver alginate to the wound bed. She has no issues or complaints today. She denies signs of infection. She reports her pain has improved in her foot since last clinic visit 8/24; patient presents for 1 week follow-up. She continues to use silver alginate to the wound bed. She has no issues or complaints. She denies signs of infection. Pain is stable. 9/7; patient presents for follow-up. She missed her last week appointment due to feeling ill. She continues to use silver alginate. She has a new wound to the right lower extremity that is covered in eschar. She states It occurred over the past week and has no idea how it started. She currently denies signs of infection. 9/14; patient presents for follow-up. T the left foot wound she has been using gentamicin cream and silver alginate. T the right lower extremity wound she has o o been keeping this covered and has not obtain Santyl. 9/21; patient presents for follow-up. She reports using gentamicin cream and silver alginate to the left foot and Santyl to the right lower extremity wound. She has no issues or complaints today. She denies signs of infection. 9/28; patient presents for follow-up. She reports a new wound to her right heel. She states this occurred a few days ago and is progressively gotten worse. She has been trying to clean the area with a Q-tip and Santyl. She reports stability in the other 2 wounds. She has been using gentamicin cream and silver alginate to the left foot and Santyl to the right lower extremity wound. 10/12; patient presents for follow-up. She reports improvement to the wound beds. She is seeing vein and vascular to discuss the potential of a left BKA. She states they are going to do an arteriogram.  She continues to use silver alginate with dressing changes to her wounds. 11/2; patient presents for follow-up. She states she has not been doing dressing changes to the wound beds. She states she is not able to offload the areas. She reports chronic pain to her left foot wound. 11/9; patient presents for follow-up. She came in with only socks on. She states she forgot to put on shoes. It is unclear if she is doing any dressing changes. She currently denies systemic signs of infection. 11/16; patient presents for follow-up. She came again only with socks on. She states she does not wear shoes ever. It is unclear if she does dressing changes. She currently denies systemic signs of infection. 11/23; patient presents for follow-up. She wore her shoes today. It still unclear exactly what dressing she is using for each wound but she did states she obtained Dakin's solution and has been using this to the left foot wound. She currently denies signs of infection. 11/30; patient presents for follow-up. She has no issues or complaints today. She currently denies signs of infection. 12/7; patient presents for follow-up. She has no issues or complaints today. She has been using Hydrofera Blue to the right heel wound and Dakin solution to the left foot wound. Her right anterior leg wound is healed. She currently denies signs of infection. 12/14; patient presents for follow-up. She has been using Hydrofera Blue to the right heel and Dakin's to the left foot wounds. She has no issues or complaints today. She denies signs of infection. 12/21; patient presents for follow-up. She reports using Hydrofera Blue to the right heel and Dakin's to the left foot wound. She denies signs of infection. 12/28; patient presents for follow-up. She continues  to use Dakin's to the left foot wound and Hydrofera Blue to the right heel wound. She denies signs of infection. 1/4; patient presents for follow-up. She has no issues or  complaints today. She denies signs of infection. 1/11; patient presents for follow-up. It is unclear if she has been dressing these wounds over the past week. She currently denies signs of infection. 1/18; patient presents for follow-up. She states she has been using Dakin's wet-to-dry dressings to the left foot. She has been using Hydrofera Blue to the right foot foot wound. She states that the anterior right leg wound has reopened and draining serous fluid. She denies signs of infection. 1/25; patient presents for follow-up. She has no issues or complaints today. 2/1; patient presents for follow-up. She has no issues or complaints today. She denies signs of infection. 2/8; patient presents for follow-up. She has lost her surgical shoes. She did not have a dressing to the right heel wound. She currently denies signs of infection. 2/15; patient presents for follow-up. She reports more pain to the right heel today. She denies purulent drainage Or fever/chills 2/22; patient presents for follow-up. She reports taking clindamycin over the past week. She states that she continues to have pain to her right heel. She reports purulent drainage. Readmission 03/16/2022 Ms. Feiga Nadel is a 47 year old female with a past medical history of type 2 diabetes, osteomyelitis to her feet, chronic systolic heart failure and bipolar disorder that presents to the clinic for bilateral feet wounds and right lower extremity wound. She was last seen in our clinic on 12/15/2021. At that time she had purulent drainage coming out of her right plantar foot and I recommended she go to the ED. She states she went to [facility] and has been there for the past 3 months. I cannot see the records. She states she had OR debridement and was on several weeks of IV antibiotics while inpatient. Since discharge she has not been taking care of the wound beds. She had nothing on her feet other than socks today. She currently  denies signs of infection. 5/31; patient presents for follow-up. She has been using Dakin's wet-to-dry dressings to the wound beds on her feet bilaterally and antibiotic ointment to the right anterior leg wound. She had a wound culture done at last clinic visit that showed moderate Pseudomonas aeruginosa sensitive to ciprofloxacin. She currently denies systemic signs of infection. 6/14; patient presents for follow-up. She received Keystone 5 days ago and has been using this on the wound beds. She states that last week she had to go to the hospital because she had increased warmth and erythema to the right foot. She was started on 2 oral antibiotics. She states she has been taking these. She currently denies systemic signs of infection. She has no issues or complaints today. 6/21; patient presents for follow-up. She states she has been using Keystone antibiotics to the wound beds. She has no issues or complaints today. She denies signs of infection. 6/28; patient presents for follow-up. She has been using Keystone antibiotics to the wound beds. She has no issues or complaints today. 7/12; patient presents for follow-up. Has been using Keystone antibiotics to the wound beds with calcium alginate. She has no issues or complaints today. She never followed up with her orthopedic surgeon who did the OR debridement to the right foot. We discussed the total contact cast for the left foot and patient would like to do this next week. 7/19; patient presents for  follow-up. She has been using Keystone antibiotics with calcium alginate to the wound beds. She has no issues or complaints today. Patient is in agreement to do the total contact cast of the left foot today. She knows to return later this week for the obligatory cast change. 05-13-2022 upon evaluation today patient's wound which she has the cast of the left leg actually appears to be doing significantly better. Fortunately I do not see any signs of active  infection locally or systemically which is great news and overall I am extremely pleased with where we stand currently. SHANEKIA, LATELLA A ([id number]) [id number] Page 17 of 22 7/26; patient presents for follow-up. She has a cast in place for the past week. She states it irritated her shin. Other than that she tolerated the cast well. She states she would like a break for 1 week from the cast. We have been using Keystone antibiotic and Aquacel to both wound beds. She denies signs of infection. 8/2; patient presents for follow-up. She has been using Keystone and Aquacel to the wound beds. She denies any issues and has no complaints. She is agreeable to have the cast placed today for the left leg. 06-03-2022 upon evaluation today patient appears to be doing well with regard to her wound she saw some signs of improvement which is great news. Fortunately I do not see any evidence of active infection locally or systemically at this time which is great news. No fevers, chills, nausea, vomiting, or diarrhea. 8/16; patient presents for follow-up. She has no issues or complaints today. We have been using Keystone and Aquacel to the wound beds. The left lower extremity is in a total contact cast. She is tolerated this well. 8/23; patient presents for follow-up. She has had the total contact cast on the left leg for the past week. Unfortunately this has rubbed and broken down the skin to the medial foot. She currently denies signs of infection. She has been using Keystone antibiotic to the right plantar foot wound. 8/30; patient presents for follow-up. We have held off on the total contact cast for the left leg for the past week. Her wound on the left foot has improved and the previous surrounding breakdown of skin has epithelialized. She has been using Keystone antibiotic to both wound beds. She has no issues or complaints today. She denies signs of infection. 9/6; patient presents  for follow-up. She has ordered her's Keystone antibiotic and this is arriving this week. She has been doing Dakin's wet-to-dry dressings to the wound beds. She denies signs of infection. She is agreeable to the total contact cast today. 9/13; patient presents for follow-up. She states that the cast caused her left leg shin to be sore. She would like to take a break from the cast this week. She has been using Keystone antibiotic to the right plantar foot wound. She denies signs of infection. 9/20; patient presents for follow-up. She has been using Keystone antibiotics to the wound beds with calcium alginate to the right foot wound and Hydrofera Blue to the left foot wound. She is agreeable to having the cast placed today. She has been approved for Apligraf and we will order this for next clinic visit. 9/27; patient presents for follow-up. We have been using Keystone antibiotic with Hydrofera Blue to the left foot wound under a total contact cast. T the right o foot wound she has been using Keystone antibiotic and calcium alginate. She declines a total contact cast today. Apligraf is available  for placement and she would like to proceed with this. 07-28-2022 upon evaluation today patient appears to be doing well currently in regard to her wound. She is actually showing signs of significant improvement which is great news. Fortunately I do not see any evidence of active infection locally nor systemically at this time. She has been seeing Dr. Heber Paoli and to be honest has been doing very well with the cast. Subsequently she comes in today with a cast on and we did reapply that today as well. She did not really want to she try to talk me out of that but I explained that if she wanted to heal this is really the right way to go. Patient voiced understanding. In regard to her right foot this is actually a lot better compared to the last time I saw her which is also great news. 10/11; patient presents for  follow-up. Apligraf and the total contact cast was placed to the left leg at last clinic visit. She states that her right foot wound had burning pain to it with the placement of Apligraf to this area. She has been doing Gantt over this area. She denies signs of infection including increased warmth, erythema or purulent drainage. 11/1; 3-week follow-up. The patient fortunately did not have a total contact cast or an Apligraf and on the left foot. She has been using Keystone ABD pads and kerlix and her own running shoes She arrives in clinic today with thick callus and a very poor surface on the left foot on the right nonviable skin subcutaneous tissue and a deep probing hole. 11/15; patient missed her last clinic appointment. She states she has not been dressing the wound beds for the past 2 weeks. She states that at she had a new roommate but is now going back to live with her mother. Apparently its been a distracting 2 weeks. Patient currently denies signs of infection. 11/22; patient presents for follow-up. She states she has been using Keystone antibiotic and Dakin's wet-to-dry dressings to the wound beds. She is agreeable for cast placement today. We had ordered Apligraf however this has not been received by our facility. 11/29; Patient had a total contact cast placed at last clinic visit and she tolerated this well. We were using silver alginate under the cast. Patient's been using Keystone antibiotic with Aquacel to the right plantar foot wound. She has no issues or complaints today. Apligraf is available for placement today. Patient would like to proceed with this. 12/6; patient presents for follow-up. She had Apligraf placed in standard fashion last clinic visit under the total contact cast to the left lower extremity. She has been using Keystone antibiotic and Aquacel to the right plantar foot wound. She has no issues or complaints today. 12/13; patient presents for follow-up. She has  finished 5 Apligraf placements. Was told she would not qualify for more. We have been doing a total contact cast to the left lower extremity. She has been using Keystone antibiotic and Aquacel to the right plantar foot wound. She has no issues or complaints today. 12/20; patient presents for follow-up. We have been using Hydrofera Blue with Keystone antibiotic under a total contact cast of the left lower extremity. She reports using Keystone antibiotic and silver alginate to the right heel wound. She has no issues or complaints today. 12/27; patient presents with a healthy wound on the left midfoot. We have Apligraf to apply that to that more also using a total contact cast. On the right  we are using Keystone and silver alginate. She is offloading the right heel with a surgical shoealthough by her admission she is on her feet quite a bit 1/3; patient presents for follow-up. Apligraf was placed to the wound beds last clinic visit. She was placed in a total contact cast to the left lower extremity. She declines a total contact cast today. She states that her mother is in the hospital and she cannot adequately get around with the cast on. 1/10; patient presents for follow-up. She declined the total contact cast at last clinic visit. Both wounds have declined in appearance. She states that she has been on her feet and not offloading the wound beds. She currently denies signs of infection. Objective Constitutional Vitals Time Taken: 11:05 AM, Height: 69 in, Weight: 178 lbs, BMI: 26.3, Temperature: 98.2 F, Pulse: 66 bpm, Respiratory Rate: 16 breaths/min, Blood Pressure: 119/80 mmHg. MIYU, FENDERSON A ([id number]) [id number] Page 18 of 22 General Notes: Right foot: T the plantar heel there is an incision site with increased depth. T the opening there is Nonviable tissue and granulation tissue. Left o o foot: T the medial aspect there is an open wound with granulation tissue and  nonviable tissue with surrounding callus. No overt signs of infection to any of the o wound beds. Integumentary (Hair, Skin) Wound #11 status is Open. Original cause of wound was Surgical Injury. The date acquired was: 12/01/2021. The wound has been in treatment 33 weeks. The wound is located on the [place]. The wound measures 1cm length x 1cm width x 1cm depth; 0.785cm^2 area and 0.785cm^3 volume. There is Fat Layer (Subcutaneous Tissue) exposed. There is no tunneling noted, however, there is undermining starting at 12:00 and ending at 12:00 with a maximum distance of 0.5cm. There is a medium amount of serosanguineous drainage noted. The wound margin is distinct with the outline attached to the wound base. There is medium (34-66%) pale granulation within the wound bed. There is a medium (34-66%) amount of necrotic tissue within the wound bed. Wound #12 status is Open. Original cause of wound was Pressure Injury. The date acquired was: 03/16/2020. The wound has been in treatment 33 weeks. The wound is located on the [place]. The wound measures 1.5cm length x 1cm width x 0.7cm depth; 1.178cm^2 area and 0.825cm^3 volume. There is Fat Layer (Subcutaneous Tissue) exposed. There is no tunneling or undermining noted. There is a medium amount of serous drainage noted. The wound margin is flat and intact. There is small (1-33%) red, pink granulation within the wound bed. There is a large (67-100%) amount of necrotic tissue within the wound bed including Adherent Slough. General Notes: callus surrounding wound; rash on medial side of foot going up past ankle. 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 She is at high risk for amputation due to lack of compliance with wound care instructions. I debrided nonviable tissue. I was able to get a good enough service to apply Apligraf to the left foot. This was done in standard fashion. She is agreeable to the total contact cast today. No signs  of infection. Follow-up in 1 week. Procedures Wound #11 Pre-procedure diagnosis of Wound #11 is an Open Surgical Wound located on the Right,Plantar Foot . There was a Excisional Skin/Subcutaneous Tissue Debridement with a total area of 1 sq cm performed by Kalman Shan, MD. With the following instrument(s): Curette to remove Viable and Non-Viable tissue/material. Material removed includes Callus, Subcutaneous Tissue, and Slough. No specimens were taken. A time out was conducted at 10:40, prior to the start of the procedure. A Minimum amount of bleeding was controlled with Pressure. The procedure was tolerated well. Post Debridement Measurements: 1cm length x 1cm width x 1.1cm depth; 0.864cm^3 volume. Character of Wound/Ulcer Post Debridement is stable. 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 There was a Excisional Skin/Subcutaneous Tissue Debridement with a total area of 7 sq cm performed by Kalman Shan, MD. With the following instrument(s): Curette to remove Viable and Non-Viable tissue/material. Material removed includes Callus, Subcutaneous Tissue, and Slough. No specimens were taken. A time out was conducted at 10:40, prior to the start of the procedure. A Minimum amount of bleeding was controlled with Pressure. The procedure was  tolerated well. Post Debridement Measurements: 3.5cm length x 2cm width x 0.8cm depth; 4.398cm^3 volume. Character of Wound/Ulcer Post Debridement is stable. Severity of Tissue Post Debridement is: Fat layer exposed. Post procedure Diagnosis Wound #12: Same as Pre-Procedure Pre-procedure diagnosis of Wound #12 is a Diabetic Wound/Ulcer of the Lower Extremity located on the Left,Medial,Plantar Foot. A skin graft procedure using a bioengineered skin substitute/cellular or tissue based product was performed by Kalman Shan, MD with the following instrument(s): Forceps and Scissors. Apligraf was applied and secured with Steri-Strips. 1.5 sq cm of product was utilized and 42.5 sq cm was wasted due to wound size. Post Application, mepitel was applied. A Time Out was conducted at 11:59, prior to the start of the procedure. The procedure was tolerated well with a pain level of 0 throughout and a pain level of 0 following the procedure. Post procedure Diagnosis Wound #12: Same as Pre-Procedure . Plan Follow-up Appointments: Return Appointment in 1 week. Nurse Visit as needed Bathing/ Shower/ Hygiene: May shower with wound dressing protected with water repellent cover or cast protector. No tub bath. Anesthetic (Use 'Patient Medications' Section for Anesthetic Order Entry): Lidocaine applied to wound bed Cellular or Tissue Based Products: ERMALEE, MEALY ([id number]) [id number] Page 19 of 22 Cellular or Tissue Based Product Type: - Apligraf applied Cellular or Tissue Based Product applied to wound bed; including contact layer, fixation with steri-strips, dry gauze and cover dressing. (DO NOT REMOVE). Edema Control - Lymphedema / Segmental Compressive Device / Other: Elevate, Exercise Daily and Avoid Standing for Long Periods of Time. Elevate legs to the level of the heart and pump ankles as often as possible Elevate leg(s) parallel to the floor when  sitting. Off-Loading: T Contact Cast to Left Lower Extremity otal Open toe surgical shoe - Heel off loader - Right foot Other: - keep pressure off of feet. Additional Orders / Instructions: Follow Nutritious Diet and Increase Protein Intake Other: - Stay off of foot, wheelchair only Medications-Please add to medication list.: Wound #11 Right,Plantar Foot: Keystone Compound WOUND #11: - Foot Wound Laterality: Plantar, Right Cleanser: Wound Cleanser 1 x Per Day/30 Days Discharge Instructions: Wash your hands with soap and water. Remove old dressing,  discard into plastic bag and place into trash. Cleanse the wound with Wound Cleanser prior to applying a clean dressing using gauze sponges, not tissues or cotton balls. Do not scrub or use excessive force. Pat dry using gauze sponges, not tissue or cotton balls. Topical: keystone gel 1 x Per Day/30 Days Prim Dressing: Aquacel Extra Hydrofiber Dressing, 2x2 (in/in) 1 x Per Day/30 Days ary Secondary Dressing: ABD Pad 5x9 (in/in) (Generic) 1 x Per Day/30 Days Discharge Instructions: Cover with ABD pad Secured With: Medipore T - 74M Medipore H Soft Cloth Surgical T ape ape, 2x2 (in/yd) 1 x Per Day/30 Days Secured With: [employer] Sterile or Non-Sterile 6-ply 4.5x4 (yd/yd) 1 x Per Day/30 Days Discharge Instructions: Apply Kerlix as directed WOUND #12: - Foot Wound Laterality: Plantar, Left, Medial Cleanser: Wound Cleanser 1 x Per Day/30 Days Discharge Instructions: Wash your hands with soap and water. Remove old dressing, discard into plastic bag and place into trash. Cleanse the wound with Wound Cleanser prior to applying a clean dressing using gauze sponges, not tissues or cotton balls. Do not scrub or use excessive force. Pat dry using gauze sponges, not tissue or cotton balls. Topical: Triamcinolone Acetonide Cream, 0.1%, 15 (g) tube 1 x Per Day/30 Days Discharge Instructions: Apply as directed by provider. Secondary Dressing: ABD Pad 5x9  (in/in) (Generic) 1 x Per Day/30 Days Discharge Instructions: Cover with ABD pad Secured With: Kerlix Roll Sterile or Non-Sterile 6-ply 4.5x4 (yd/yd) 1 x Per Day/30 Days Discharge Instructions: Apply Kerlix as directed 1. In office sharp debridement 2. Apligraf placed in standard fashionooleft foot 3. T contact castooleft lower extremity otal 4. Follow-up in 1 week 5. Aggressive offloadingoosurgical shoe with peg assist to the right foot Electronic Signature(s) Signed: 11/02/2022 2:05:02 PM By: Kalman Shan DO Entered By: Kalman Shan on 11/02/2022 12:20:05 -------------------------------------------------------------------------------- ROS/PFSH Details Patient Name: Date of Service: [name] 11/02/2022 11:00 A M Medical Record Number: [id number] Patient Account Number: [id number] Date of Birth/Sex: Treating RN: 1976/06/03 (47 y.o. [name] Primary Care Provider: Raelene Bott Other Clinician: Massie Kluver Referring Provider: Treating Provider/Extender: Eddie North in Treatment: 21 Information Obtained From Patient Ear/Nose/Mouth/Throat Medical History: Positive for: Chronic sinus problems/congestion; Middle ear problems RAQUELLE, PIETRO A ([id number]) [id number] Page 20 of 22 Hematologic/Lymphatic Medical History: Positive for: Anemia Respiratory Medical History: Positive for: Chronic Obstructive Pulmonary Disease (COPD) Cardiovascular Medical History: Positive for: Congestive Heart Failure - EF 33% Endocrine Medical History: Positive for: Type II Diabetes Time with diabetes: 14 years Treated with: Insulin, Oral agents Blood sugar tested every day: No Blood sugar testing results: Bedtime: 176 Genitourinary Medical History: Positive for: End Stage Renal Disease Integumentary (Skin) Medical History: Positive for: History of pressure wounds Neurologic Medical History: Positive for:  Neuropathy - feel and legs HBO Extended History Items Ear/Nose/Mouth/Throat: Ear/Nose/Mouth/Throat: Chronic sinus Middle ear problems problems/congestion Immunizations Pneumococcal Vaccine: Received Pneumococcal Vaccination: No Implantable Devices No devices added Family and Social History Current every day smoker; Marital Status - Single; Alcohol Use: Never; Drug Use: Prior History; Caffeine Use: Daily Electronic Signature(s) Signed: 11/02/2022 2:05:02 PM By: Kalman Shan DO Signed: 11/03/2022 3:17:21 PM By: Gretta Cool, BSN, RN, CWS, [name] RN, BSN Entered By: Kalman Shan on 11/02/2022 12:30:55 Nile Dear A ([id number]) [id number] Page 21 of 22 -------------------------------------------------------------------------------- Total Contact Cast Details Patient Name: Date of Service: TRISA, CRANOR 11/02/2022 11:00 A M Medical Record Number: [id number] Patient Account Number: [id number] Date of Birth/Sex: Treating RN: 1976-09-12 (47 y.o. F) Gretta Cool,  [name] Primary Care Provider: Raelene Bott Other Clinician: Massie Kluver Referring Provider: Treating Provider/Extender: Eddie North in Treatment: 62 T Contact Cast Applied for Wound Assessment: otal Wound #12 Left,Medial,Plantar Foot Performed By: Physician Kalman Shan, MD Post Procedure Diagnosis Same as Pre-procedure Notes Returned to room during drying time, patient had legs crossed putting shoe on other foot. Pressure placed on cast before dry. Cast does not appear damaged. Provider notified. Electronic Signature(s) Signed: 11/02/2022 2:05:02 PM By: Kalman Shan DO Signed: 11/03/2022 3:17:21 PM By: Gretta Cool, BSN, RN, CWS, [name] RN, BSN Entered By: Gretta Cool, BSN, RN, CWS, [name] on 11/02/2022 12:41:57 -------------------------------------------------------------------------------- [place] Details Patient Name: Date of Service: Michel Harrow 11/02/2022 Medical Record  Number: [id number] Patient Account Number: [id number] Date of Birth/Sex: Treating RN: 10-24-1976 (47 y.o. [name], [name] Primary Care Provider: Raelene Bott Other Clinician: Massie Kluver Referring Provider: Treating Provider/Extender: Eddie North in Treatment: 33 Diagnosis Coding ICD-10 Codes Code Description [phone] 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ity 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.621 Type 2 diabetes mellitus with foot ulcer E11.42 Type 2 diabetes mellitus with diabetic polyneuropathy L97.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 of skin Facility Procedures : CPT4 Code: [id number] Description: [phone] (Facility Use Only) Apligraf 44 SQ CM Modifier: Quantity: 44 : CPT4 Code: [id number] Description: 29937 - SKIN SUB GRAFT FACE/NK/HF/G ICD-10 Diagnosis Description 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 E11.621 Type 2 diabetes mellitus with foot ulcer Modifier: ity Quantity: 1 Physician Procedures : CPT4 Code Description Modifier [id number] - WC PHYS SKIN SUB GRAFT FACE/NK/HF/G ICD-10 Diagnosis Description 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ity E11.621 Type 2 diabetes mellitus with foot ulcer Quantity: 1 : [id number] - WC PHYS SUBQ TISS 20 SQ CM ICD-10 Diagnosis Description 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.621 Type 2 diabetes mellitus with foot ulcer Quantity: 1 Electronic Signature(s) Signed: 11/02/2022 2:05:02 PM By: Kalman Shan DO Entered By: Kalman Shan on 11/02/2022 12:20:26

## 2022-11-03 NOTE — Progress Notes (Signed)
[name] ([id number]) [id number] Page 1 of 10 Visit Report for 11/02/2022 Arrival Information Details Patient Name: Date of Service: [name], [name] 11/02/2022 11:00 [name]: [id number] Patient Account Number: [id number] Date of Birth/Sex: Treating RN: [birth year] (47 y.o. [name], [name] Primary Care Shyanna Klingel: Raelene Bott Other Clinician: Massie Kluver Referring Jaleiyah Alas: Treating Imojean Yoshino/Extender: Eddie North in Treatment: 38 Visit Information History Since Last Visit Added or deleted any medications: No Patient Arrived: Ambulatory Hospitalized since last visit: No Arrival Time: 11:09 Has Dressing in Place as Prescribed: Yes Accompanied By: mom Has Footwear/Offloading in Place as Yes Transfer Assistance: None Prescribed: Patient Identification Verified: Yes Left: Surgical Shoe with Pressure Relief Insole Secondary Verification Process Completed: Yes Right: Surgical Shoe with Pressure Relief Insole Patient Requires Transmission-Based Precautions: No Pain Present Now: No Patient Has Alerts: No Electronic Signature(s) Signed: 11/03/2022 3:17:21 PM By: Gretta Cool, BSN, RN, CWS, [name] RN, BSN Entered By: Gretta Cool, BSN, RN, CWS, [name] on 11/02/2022 11:10:29 -------------------------------------------------------------------------------- Encounter Discharge Information Details Patient Name: Date of Service: Heather Hitch [name]. 11/02/2022 11:00 [name]: [id number] Patient Account Number: [id number] Date of Birth/Sex: Treating RN: [birth year] (47 y.o. [name] Primary Care Jyra Lagares: Raelene Bott Other Clinician: Massie Kluver Referring Terriann Difonzo: Treating Necha Harries/Extender: Eddie North in Treatment: 64 Encounter Discharge Information Items Post Procedure Vitals Discharge Condition: Stable Temperature (F): 98.2 Ambulatory Status: Ambulatory Pulse (bpm):  66 Discharge Destination: Home Respiratory Rate (breaths/min): 16 Transportation: Private Auto Blood Pressure (mmHg): 119/80 Accompanied By: mom Schedule Follow-up Appointment: Yes Clinical Summary of Care: Electronic Signature(s) Signed: 11/02/2022 1:11:33 PM By: Gretta Cool, BSN, RN, CWS, [name] RN, BSN [place], Oasis [name] ([id number]) PM By: Gretta Cool, BSN, RN, CWS, [name] RN, BSN [id number].pdf Page 2 of 10 Signed: 11/02/2022 1:11:33 Entered By: Gretta Cool, BSN, RN, CWS, [name] on 11/02/2022 13:11:33 -------------------------------------------------------------------------------- Lower Extremity Assessment Details Patient Name: Date of Service: [name] 11/02/2022 11:00 [name]: [id number] Patient Account Number: [id number] Date of Birth/Sex: Treating RN: 1976-01-17 (47 y.o. [name] Primary Care Kendry Pfarr: Raelene Bott Other Clinician: Massie Kluver Referring Taisia Fantini: Treating Ellyse Rotolo/Extender: Linna Hoff Weeks in Treatment: 33 Edema Assessment Assessed: [Left: No] [Right: No] [Left: Edema] [Right: :] Calf Left: Right: Point of Measurement: 30 cm From Medial Instep 31 cm Ankle Left: Right: Point of Measurement: 9 cm From Medial Instep 22.3 cm Vascular Assessment Pulses: Dorsalis Pedis Palpable: [Left:No Yes] [Right:No Yes] Electronic Signature(s) Signed: 11/03/2022 3:17:21 PM By: Gretta Cool, BSN, RN, CWS, [name] RN, BSN Entered By: Gretta Cool, BSN, RN, CWS, [name] on 11/02/2022 11:27:18 -------------------------------------------------------------------------------- Multi Wound Chart Details Patient Name: Date of Service: Heather Hitch [name]. 11/02/2022 11:00 [name]: [id number] Patient Account Number: [id number] Date of Birth/Sex: Treating RN: [birth year] (47 y.o. [name] Primary Care Kawanna Christley: Raelene Bott Other Clinician: Massie Kluver Referring Rasheedah Reis: Treating Davonda Ausley/Extender: Linna Hoff Weeks in Treatment: [address] Vital Signs [name] ([id number]) [id number] Page 3 of 10 Height(in): 69 Pulse(bpm): 66 Weight(lbs): 178 Blood Pressure(mmHg): 119/80 Body Mass Index(BMI): 26.3 Temperature(F): 98.2 Respiratory Rate(breaths/min): 16 [11:Photos:] [N/[name]:N/[name]] Right, Plantar Foot Left, Medial, Plantar Foot N/[name] Wound Location: Surgical Injury Pressure Injury N/[name] Wounding Event: Open Surgical Wound 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: Extremity Chronic sinus problems/congestion, Chronic sinus problems/congestion, N/[name] Comorbid History: Middle ear problems, Anemia, Chronic Middle ear problems, Anemia, Chronic Obstructive Pulmonary Disease Obstructive Pulmonary Disease (COPD), Congestive Heart Failure, (COPD), Congestive Heart  Failure, Type II Diabetes, End Stage Renal Type II Diabetes, End Stage Renal Disease, History of pressure wounds, Disease, History of pressure wounds, Neuropathy Neuropathy 12/01/2021 03/16/2020 N/[name] Date Acquired: 43 33 N/[name] Weeks of Treatment: Open Open N/[name] Wound Status: No No N/[name] Wound Recurrence: 1x1x1 1.5x1x0.7 N/[name] Measurements L x W x D (cm) 0.785 1.178 N/[name] [name] (cm) : rea 0.785 0.825 N/[name] Volume (cm) : 57.50% 75.00% N/[name] % Reduction in [name] rea: 52.70% 12.40% N/[name] % Reduction in Volume: 12 Starting Position 1 (o'clock): 12 Ending Position 1 (o'clock): 0.5 Maximum Distance 1 (cm): Yes No N/[name] Undermining: Full Thickness Without Exposed Grade 3 N/[name] Classification: Support Structures Medium Medium N/[name] Exudate Amount: Serosanguineous Serous N/[name] Exudate Type: red, brown amber N/[name] Exudate Color: Distinct, outline attached Flat and Intact N/[name] Wound Margin: Medium (34-66%) Small (1-33%) N/[name] Granulation Amount: Pale Red, Pink N/[name] Granulation Quality: Medium (34-66%) Large (67-100%) N/[name] Necrotic Amount: Fat Layer (Subcutaneous Tissue): Yes Fat Layer (Subcutaneous  Tissue): Yes N/[name] Exposed Structures: Fascia: No Fascia: No Tendon: No Tendon: No Muscle: No Muscle: No Joint: No Joint: No Bone: No Bone: No Medium (34-66%) None N/[name] Epithelialization: N/[name] callus surrounding wound; rash on N/[name] Assessment Notes: medial side of foot going up past ankle. (see photo below) Treatment Notes Electronic Signature(s) Signed: 11/03/2022 3:17:21 PM By: Gretta Cool, BSN, RN, CWS, [name] RN, BSN Entered By: Gretta Cool, BSN, RN, CWS, [name] on 11/02/2022 11:42:46 [name] ([id number]) [id number] Page 4 of 10 -------------------------------------------------------------------------------- Multi-Disciplinary Care Plan Details Patient Name: Date of Service: JAYNI, PRESCHER 11/02/2022 11:00 [name]: [id number] Patient Account Number: [id number] Date of Birth/Sex: Treating RN: 1976-03-01 (47 y.o. [name], [name] Primary Care Davonne Jarnigan: Raelene Bott Other Clinician: Massie Kluver Referring Bitania Shankland: Treating Zofia Peckinpaugh/Extender: Eddie North in Treatment: 14 Active Inactive Abuse / Safety / Falls / Self Care Management Nursing Diagnoses: History of Falls Potential for falls Potential for injury related to falls Goals: Patient will not develop complications from immobility Date Initiated: 03/17/2022 Date Inactivated: 04/20/2022 Target Resolution Date: 03/16/2022 Goal Status: Met Patient/caregiver will verbalize understanding of skin care regimen Date Initiated: 03/17/2022 Date Inactivated: 09/28/2022 Target Resolution Date: 03/16/2022 Goal Status: Met Patient/caregiver will verbalize/demonstrate measure taken to improve self care Date Initiated: 03/17/2022 Target Resolution Date: 03/16/2022 Goal Status: Active Interventions: Assess fall risk on admission and as needed Provide education on basic hygiene Provide education on personal and home safety Notes: Necrotic Tissue Nursing  Diagnoses: Impaired tissue integrity related to necrotic/devitalized tissue Knowledge deficit related to management of necrotic/devitalized tissue Goals: Necrotic/devitalized tissue will be minimized in the wound bed Date Initiated: 03/17/2022 Target Resolution Date: 10/26/2022 Goal Status: Active Patient/caregiver will verbalize understanding of reason and process for debridement of necrotic tissue Date Initiated: 03/17/2022 Date Inactivated: 09/28/2022 Target Resolution Date: 03/16/2022 Goal Status: Met Interventions: Provide education on necrotic tissue and debridement process Treatment Activities: Apply topical anesthetic as ordered : 03/16/2022 Excisional debridement : 03/16/2022 Notes: Osteomyelitis Nursing Diagnoses: Infection: osteomyelitis Randle, Keina [name] ([id number]) [id number] Page 5 of 10 Knowledge deficit related to disease process and management Goals: Patient/caregiver will verbalize understanding of disease process and disease management Date Initiated: 03/17/2022 Target Resolution Date: 03/16/2022 Goal Status: Active Patient's osteomyelitis will resolve Date Initiated: 03/17/2022 Target Resolution Date: 10/26/2022 Goal Status: Active Signs and symptoms for osteomyelitis will be recognized and promptly addressed Date Initiated: 03/17/2022 Target Resolution Date: 10/26/2022 Goal Status: Active Interventions: Assess for signs and symptoms of osteomyelitis resolution every visit Provide education on osteomyelitis  Treatment Activities: Systemic antibiotics : 03/16/2022 Notes: Patient on antibiotics. Pressure Nursing Diagnoses: Knowledge deficit related to management of pressures ulcers Potential for impaired tissue integrity related to pressure, friction, moisture, and shear Goals: Patient will remain free from development of additional pressure ulcers Date Initiated: 11/02/2022 Target Resolution Date: 11/09/2022 Goal Status:  Active Patient/caregiver will verbalize risk factors for pressure ulcer development Date Initiated: 11/02/2022 Target Resolution Date: 11/02/2022 Goal Status: Active Patient/caregiver will verbalize understanding of pressure ulcer management Date Initiated: 11/02/2022 Target Resolution Date: 11/02/2022 Goal Status: Active Interventions: Assess: immobility, friction, shearing, incontinence upon admission and as needed Provide education on pressure ulcers Notes: Electronic Signature(s) Signed: 11/02/2022 1:09:38 PM By: Gretta Cool, BSN, RN, CWS, [name] RN, BSN Entered By: Gretta Cool, BSN, RN, CWS, [name] on 11/02/2022 13:09:38 -------------------------------------------------------------------------------- Pain Assessment Details Patient Name: Date of Service: Heather Hitch [name]. 11/02/2022 11:00 [name]: [id number] Patient Account Number: [id number] Date of Birth/Sex: Treating RN: [birth year] (47 y.o. [name] Primary Care Chanel Mckesson: Raelene Bott Other Clinician: Massie Kluver Referring Jaedin Trumbo: Treating Shakeitha Umbaugh/Extender: Eddie North in Treatment: 63 Active Problems Location of Pain Severity and Description of Pain Patient Has Paino No POLINA, BURMASTER [name] ([id number]) [id number] Page 6 of 10 Patient Has Paino No Site Locations Pain Management and Medication Current Pain Management: Notes "Not bad" Electronic Signature(s) Signed: 11/03/2022 3:17:21 PM By: Gretta Cool, BSN, RN, CWS, [name] RN, BSN Entered By: Gretta Cool, BSN, RN, CWS, [name] on 11/02/2022 11:11:12 -------------------------------------------------------------------------------- Patient/Caregiver Education Details Patient Name: Date of Service: Michel Harrow 1/10/2024andnbsp11:00 [place] Record Number: [id number] Patient Account Number: [id number] Date of Birth/Gender: Treating RN: 10/31/1975 (47 y.o. [name] Primary Care Physician: Raelene Bott Other  Clinician: Massie Kluver Referring Physician: Treating Physician/Extender: Eddie North in Treatment: 19 Education Assessment Education Provided To: Patient Education Topics Provided Offloading: Handouts: How [place], Other: TCC applied today, continue to use wheelchair and keep off of foot. Methods: Explain/Verbal Responses: State content correctly Safety: Handouts: Fall Prevention and Safe Transfers, Other: patient feels comfortable walking in cast. Methods: Explain/Verbal Responses: State content correctly Wound Debridement: Handouts: Wound Debridement Methods: Demonstration, Explain/Verbal Responses: State content correctly [place], Ellar [name] ([id number]) [id number] Page 7 of 10 Electronic Signature(s) Signed: 11/03/2022 3:17:21 PM By: Gretta Cool, BSN, RN, CWS, [name] RN, BSN Entered By: Gretta Cool, BSN, RN, CWS, [name] on 11/02/2022 13:07:41 -------------------------------------------------------------------------------- Wound Assessment Details Patient Name: Date of Service: SHARONNE, RICKETTS 11/02/2022 11:00 [name]: [id number] Patient Account Number: [id number] Date of Birth/Sex: Treating RN: [birth year] (46 y.o. [name], [name] Primary Care Cloa Bushong: Raelene Bott Other Clinician: Massie Kluver Referring Earleen Aoun: Treating [place] Lapaglia/Extender: Linna Hoff Weeks in Treatment: 33 Wound Status Wound Number: 11 Primary Open Surgical Wound Etiology: Wound Location: Right, Plantar Foot Wound Open Wounding Event: Surgical Injury Status: Date Acquired: 12/01/2021 Comorbid Chronic sinus problems/congestion, Middle ear problems, Anemia, Weeks Of Treatment: 33 History: Chronic Obstructive Pulmonary Disease (COPD), Congestive Heart Clustered Wound: No Failure, Type II Diabetes, End Stage Renal Disease, History of pressure wounds, Neuropathy Photos Wound Measurements Length:  (cm) 1 Width: (cm) 1 Depth: (cm) 1 Area: (cm) 0.785 Volume: (cm) 0.785 % Reduction in Area: 57.5% % Reduction in Volume: 52.7% Epithelialization: Medium (34-66%) Tunneling: No Undermining: Yes Starting Position (o'clock): 12 Ending Position (o'clock): 12 Maximum Distance: (cm) 0.5 Wound Description Classification: Full Thickness Without Exposed Suppo Wound Margin: Distinct, outline attached Exudate Amount: Medium Exudate Type: Serosanguineous Exudate Color: red, brown  rt Structures Foul Odor After Cleansing: No Slough/Fibrino Yes Wound Bed Granulation Amount: Medium (34-66%) Exposed Structure Granulation Quality: Pale Fascia Exposed: No Necrotic Amount: Medium (34-66%) Fat Layer (Subcutaneous Tissue) Exposed: Yes [place], Dannia [name] ([id number]) [id number] Page 8 of 10 Tendon Exposed: No Muscle Exposed: No Joint Exposed: No Bone Exposed: No Treatment Notes Wound #11 (Foot) Wound Laterality: Plantar, Right Cleanser Wound Cleanser Discharge Instruction: Wash your hands with soap and water. Remove old dressing, discard into plastic bag and place into trash. Cleanse the wound with Wound Cleanser prior to applying [name] clean dressing using gauze sponges, not tissues or cotton balls. Do not scrub or use excessive force. Pat dry using gauze sponges, not tissue or cotton balls. Peri-Wound Care Topical keystone gel Primary Dressing Aquacel Extra Hydrofiber Dressing, 2x2 (in/in) Secondary Dressing ABD Pad 5x9 (in/in) Discharge Instruction: Cover with ABD pad Secured With Medipore T - 48M Medipore H Soft Cloth Surgical T ape ape, 2x2 (in/yd) Kerlix Roll Sterile or Non-Sterile 6-ply 4.5x4 (yd/yd) Discharge Instruction: Apply Kerlix as directed Compression Wrap Compression Stockings Add-Ons Electronic Signature(s) Signed: 11/03/2022 3:17:21 PM By: Gretta Cool, BSN, RN, CWS, [name] RN, BSN Entered By: Gretta Cool, BSN, RN, CWS, [name] on 11/02/2022  11:18:28 -------------------------------------------------------------------------------- Wound Assessment Details Patient Name: Date of Service: Heather Hitch [name]. 11/02/2022 11:00 [name]: [id number] Patient Account Number: [id number] Date of Birth/Sex: Treating RN: [birth year] (47 y.o. [name], [name] Primary Care Perlie Scheuring: Raelene Bott Other Clinician: Massie Kluver Referring Kaylob Wallen: Treating Agnes Probert/Extender: Linna Hoff Weeks in Treatment: 33 Wound Status Wound Number: 12 Primary Diabetic Wound/Ulcer of the Lower Extremity Etiology: Wound Location: Left, Medial, Plantar Foot Wound Open Wounding Event: Pressure Injury Status: Date Acquired: 03/16/2020 Comorbid Chronic sinus problems/congestion, Middle ear problems, Anemia, Weeks Of Treatment: 33 History: Chronic Obstructive Pulmonary Disease (COPD), Congestive Heart Clustered Wound: No Failure, Type II Diabetes, End Stage Renal Disease, History of pressure wounds, Neuropathy Photos Wavra, Antoniette [name] ([id number]) [id number] Page 9 of 10 Wound Measurements Length: (cm) 1.5 Width: (cm) 1 Depth: (cm) 0.7 Area: (cm) 1.178 Volume: (cm) 0.825 % Reduction in Area: 75% % Reduction in Volume: 12.4% Epithelialization: None Tunneling: No Undermining: No Wound Description Classification: Grade 3 Wound Margin: Flat and Intact Exudate Amount: Medium Exudate Type: Serous Exudate Color: amber Foul Odor After Cleansing: No Slough/Fibrino No Wound Bed Granulation Amount: Small (1-33%) Exposed Structure Granulation Quality: Red, Pink Fascia Exposed: No Necrotic Amount: Large (67-100%) Fat Layer (Subcutaneous Tissue) Exposed: Yes Necrotic Quality: Adherent Slough Tendon Exposed: No Muscle Exposed: No Joint Exposed: No Bone Exposed: No Assessment Notes callus surrounding wound; rash on medial side of foot going up past ankle. (see photo below) Treatment  Notes Wound #12 (Foot) Wound Laterality: Plantar, Left, Medial Cleanser Wound Cleanser Discharge Instruction: Wash your hands with soap and water. Remove old dressing, discard into plastic bag and place into trash. Cleanse the wound with Wound Cleanser prior to applying [name] clean dressing using gauze sponges, not tissues or cotton balls. Do not scrub or use excessive force. Pat dry using gauze sponges, not tissue or cotton balls. Peri-Wound Care Triamcinolone Acetonide Cream, 0.1%, 15 (g) tube Discharge Instruction: Apply as directed to irritated areas. Topical Primary Dressing Secondary Dressing ABD Pad 5x9 (in/in) Discharge Instruction: Cover with ABD pad Foam Dressing, 4x4 (in/in) Discharge Instruction: used for padding ankle and forefoot. Secured With Compression Wrap Compression Stockings Environmental education officer) Signed: 11/03/2022 3:17:21 PM By: Gretta Cool, BSN, RN, CWS, [name] RN, BSN Entered By: Gretta Cool, BSN, RN, CWS,  [name] on 11/02/2022 11:22:17 DIMPLE, BASTYR [name] ([id number]) [id number] Page 10 of 10 -------------------------------------------------------------------------------- Vitals Details Patient Name: Date of Service: BRITYN, MASTROGIOVANNI 11/02/2022 11:00 [name]: [id number] Patient Account Number: [id number] Date of Birth/Sex: Treating RN: 1976/05/17 (47 y.o. [name], [name] Primary Care Elimelech Houseman: Raelene Bott Other Clinician: Massie Kluver Referring Camilo Mander: Treating Parul Porcelli/Extender: Eddie North in Treatment: 33 Vital Signs Time Taken: 11:05 Temperature (F): 98.2 Height (in): 69 Pulse (bpm): 66 Weight (lbs): 178 Respiratory Rate (breaths/min): 16 Body Mass Index (BMI): 26.3 Blood Pressure (mmHg): 119/80 Reference Range: 80 - 120 mg / dl Electronic Signature(s) Signed: 11/03/2022 3:17:21 PM By: Gretta Cool, BSN, RN, CWS, [name] RN, BSN Entered By: Gretta Cool, BSN, RN, CWS, [name] on 11/02/2022 11:10:55

## 2022-11-09 ENCOUNTER — Encounter (HOSPITAL_BASED_OUTPATIENT_CLINIC_OR_DEPARTMENT_OTHER): Payer: Medicaid Other | Admitting: Internal Medicine

## 2022-11-09 DIAGNOSIS — L97528 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ity: Secondary | ICD-10-CM

## 2022-11-09 DIAGNOSIS — L97512 Non-pressure chronic ulcer of other part of right foot with fat layer exposed: Secondary | ICD-10-CM

## 2022-11-09 DIAGNOSIS — E11621 Type 2 diabetes mellitus with foot ulcer: Secondary | ICD-10-CM

## 2022-11-11 NOTE — Progress Notes (Signed)
Heather, [name] Heather ([id number]) [id number] Page 1 of 21 Visit Report for 11/09/2022 Chief Complaint Document Details Patient Name: Date of Service: Heather Heather, Heather Heather 11/09/2022 11:00 Heather Heather Medical Record Number: [id number] Patient Account Number: [id number] Date of Birth/Sex: Treating RN: 1975/12/19 (47 y.o. Heather Heather Primary Care Provider: Raelene [name] Other Clinician: Massie [name] Referring Provider: Treating Provider/Extender: Heather Heather in Treatment: 69 Information Obtained from: Patient Chief Complaint 03/19/2021; patient referred by Dr. Luana [name] who has been looking after her left foot for quite Heather period of time for review of Heather nonhealing area in the left midfoot 03/12/2022; bilateral feet wounds and right lower extremity wound. Electronic Signature(s) Signed: 11/09/2022 12:38:47 PM By: Heather Shan DO Entered By: Heather Heather on 11/09/2022 12:13:27 -------------------------------------------------------------------------------- Cellular or Tissue Based Product Details Patient Name: Date of Service: Heather Hitch Heather. 11/09/2022 11:00 Heather Heather Medical Record Number: [id number] Patient Account Number: [id number] Date of Birth/Sex: Treating RN: 02-16-1976 (47 y.o. Heather Heather Primary Care Provider: Raelene [name] Other Clinician: Massie [name] Referring Provider: Treating Provider/Extender: Heather Heather in Treatment: 34 Cellular or Tissue Based Product Type Wound #12 Left,Medial,Plantar Foot Applied to: Performed By: Physician Heather Shan, MD Cellular or Tissue Based Product Type: Apligraf Level of Consciousness (Pre-procedure): Awake and Alert Pre-procedure Verification/Time Out Yes - 11:54 Taken: Location: genitalia / hands / feet / multiple digits Wound Size (sq cm): 1.53 Product Size (sq cm): 44 Waste Size (sq cm): 10 Waste Reason: wound size Amount of Product Applied (sq cm):  34 Instrument Used: Blade, Scissors Lot #: [id number] Order #: 2 Expiration Date: 11/09/2022 Heather Heather ([id number]) [id number] Page 2 of 21 Fenestrated: Yes Instrument: Blade Reconstituted: Yes Solution Type: normal saline Solution Amount: 23ml Lot #: [id number] Solution Expiration Date: 11/10/2023 Secured: Yes Secured With: Steri-Strips Dressing Applied: Yes Primary Dressing: bolster Response to Treatment: Procedure was tolerated well Level of Consciousness (Post- Awake and Alert procedure): Post Procedure Diagnosis Same as Pre-procedure Electronic Signature(s) Signed: 11/11/2022 9:50:53 AM By: Heather Heather Entered By: Heather Heather on 11/09/2022 11:58:11 -------------------------------------------------------------------------------- Debridement Details Patient Name: Date of Service: Heather Hitch Heather. 11/09/2022 11:00 Heather Heather Medical Record Number: [id number] Patient Account Number: [id number] Date of Birth/Sex: Treating RN: [birth year] (47 y.o. Heather Heather, [name] Primary Care Provider: Raelene [name] Other Clinician: Massie [name] Referring Provider: Treating Provider/Extender: Heather Heather in Treatment: 34 Debridement Performed for Assessment: Wound #12 Left,Medial,Plantar Foot Performed By: Physician Heather Shan, MD Debridement Type: Debridement Severity of Tissue Pre Debridement: Fat layer exposed Level of Consciousness (Pre-procedure): Awake and Alert Pre-procedure Verification/Time Out Yes - 11:44 Taken: Start Time: 11:44 T Area Debrided (L x W): otal 3 (cm) x 3 (cm) = 9 (cm) Tissue and other material debrided: Viable, Non-Viable, Callus, Slough, Subcutaneous, Slough Level: Skin/Subcutaneous Tissue Debridement Description: Excisional Instrument: Curette Bleeding: Minimum Hemostasis Achieved: Pressure Response to Treatment: Procedure was tolerated well Level of Consciousness (Post- Awake and  Alert procedure): Post Debridement Measurements of Total Wound Length: (cm) 1.7 Width: (cm) 0.9 Depth: (cm) 0.1 Volume: (cm) 0.12 Character of Wound/Ulcer Post Debridement: Stable Severity of Tissue Post Debridement: Fat layer exposed Post Procedure Diagnosis Same as Pre-procedure Heather Heather, Heather Heather ([id number]) [id number] Page 3 of 21 Electronic Signature(s) Signed: 11/09/2022 12:38:47 PM By: Heather Shan DO Signed: 11/09/2022 11:43:47 PM By: Heather Heather BSN, RN, CWS, [name] RN, BSN Signed: 11/11/2022 9:50:53 AM By: Heather Heather Entered By: Heather Heather on 11/09/2022 11:48:44 -------------------------------------------------------------------------------- Debridement Details Patient Name: Date of  Service: Heather Heather, Heather Heather 11/09/2022 11:00 Heather Heather Medical Record Number: [id number] Patient Account Number: [id number] Date of Birth/Sex: Treating RN: 04/22/1976 (47 y.o. Heather Heather Primary Care Provider: Raelene [name] Other Clinician: Massie [name] Referring Provider: Treating Provider/Extender: Heather Heather in Treatment: 34 Debridement Performed for Assessment: Wound #11 Right,Plantar Foot Performed By: Physician Heather Shan, MD Debridement Type: Debridement Level of Consciousness (Pre-procedure): Awake and Alert Pre-procedure Verification/Time Out Yes - 11:49 Taken: Start Time: 11:49 T Area Debrided (L x W): otal 1 (cm) x 1 (cm) = 1 (cm) Tissue and other material debrided: Viable, Non-Viable, Callus, Slough, Subcutaneous, Slough Level: Skin/Subcutaneous Tissue Debridement Description: Excisional Instrument: Curette Bleeding: Minimum Hemostasis Achieved: Pressure Response to Treatment: Procedure was tolerated well Level of Consciousness (Post- Awake and Alert procedure): Post Debridement Measurements of Total Wound Length: (cm) 0.5 Width: (cm) 0.5 Depth: (cm) 0.9 Volume: (cm) 0.177 Character of Wound/Ulcer Post  Debridement: Stable Post Procedure Diagnosis Same as Pre-procedure Electronic Signature(s) Signed: 11/09/2022 12:38:47 PM By: Heather Shan DO Signed: 11/09/2022 11:43:47 PM By: Heather Heather, BSN, RN, CWS, [name] RN, BSN Signed: 11/11/2022 9:50:53 AM By: Heather Heather Entered By: Heather Heather on 11/09/2022 11:52:15 Heather Heather ([id number]) [id number] Page 4 of 21 -------------------------------------------------------------------------------- HPI Details Patient Name: Date of Service: Heather Heather, Heather Heather 11/09/2022 11:00 Heather Heather Medical Record Number: [id number] Patient Account Number: [id number] Date of Birth/Sex: Treating RN: June 15, 1976 (47 y.o. Heather Heather Primary Care Provider: Raelene [name] Other Clinician: Massie [name] Referring Provider: Treating Provider/Extender: Heather Heather in Treatment: 50 History of Present Illness HPI Description: 01/18/18-She is here for initial evaluation of the left great toe ulcer. She is Heather poor historian in regards to timeframe in detail. She states approximately 4 weeks ago she lacerated her toe on something in the house. She followed up with her primary care who placed her on Bactrim and ultimately Heather second dose of Bactrim prior to coming to wound clinic. She states she has been treating the toe with peroxide, Betadine and Heather Band-Aid. She did not check her blood sugar this morning but checked it yesterday morning it was 327; she is unaware of Heather recent A1c and there are no current records. She saw Dr. she would've orthopedics last week for an old injury to the left ankle, she states he did not see her toe, nor did she bring it to his attention. She smokes approximately 1 pack cigarettes Heather day. Her social situation is concerning, she arrives this morning with her mother who appears extremely intoxicated/under the influence; her mother was asked to leave the room and be monitored by the patient's  grandmother. The patient's aunt then accompanied the patient and the room throughout the rest of the appointment. We had Heather lengthy discussion regarding the deleterious effects of uncontrolled hyperglycemia and smoking as it relates to wound healing and overall health. She was strongly encouraged to decrease her smoking and get her diabetes under better control. She states she is currently on Heather diet and has cut down her [facility] consumption. The left toe is erythematous, macerated and slightly edematous with malodor present. The edema in her left foot is below her baseline, there is no erythema streaking. We will treat her with Santyl, doxycycline; we have ordered and xray, culture and provided Heather Peg assist surgical shoe and cultured the wound. 01/25/18-She is here in follow-up evaluation for Heather left great toe ulcer and presents with an abscess to her suprapubic area. She states  her blood sugars remain elevated, feeling "sick" and if levels are below 250, but she is trying. She has made no attempt to decrease her smoking stating that we "can't take away her food in her cigarettes". She has been compliant with offloading using the PEG assist you. She is using Santyl daily. the culture obtained last week grew staph aureus and Enterococcus faecalis; continues on the doxycycline and Augmentin was added on Monday. The suprapubic area has erythema, no femoral variation, purple discoloration, minimal induration, was accessed with Heather cotton tip applicator with sanguinopurulent drainage, this was cultured, I suspect the current antibiotic treatment will cover and we will not add anything to her current treatment plan. She was advised to go to urgent care or ER with any change in redness, induration or fever. 02/01/18-She is here in follow-up evaluation for left great toe ulcers and Heather new abdominal abscess from last week. She was able to use packing until earlier this week, where she "forgot it was there". She  states she was feeling ill with GI symptoms last week and was not taking her antibiotic. She states her glucose levels have been predominantly less than 200, with occasional levels between 200-250. She thinks this was contributing to her GI symptoms as they have resolved without intervention. There continues to be significant laceration to left toe, otherwise it clinically looks stable/improved. There is now less superficial opening to the lateral aspect of the great toe that was residual blister. We will transition to [facility] to all wounds, she will continue her Augmentin. If there is no change or deterioration next week for reculture. 02/08/18-She is here in follow-up evaluation for left great toe ulcer and abdominal ulcer. There is an improvement in both wounds. She has been wrapping her left toe with coban, not by our direction, which has created an area of discoloration to the medial aspect; she has been advised to NOT use coban secondary to her neuropathy. She states her glucose levels have been high over this last week ranging from 200-350, she continues to smoke. She admits to being less compliant with her offloading shoe. We will continue with same treatment plan and she will follow-up next week. 02/15/18-She is here in follow-up evaluation for left great toe ulcer and abdominal ulcer. The abdominal ulcer is epithelialized. The left great toe ulcer is improved and all injury from last week using the Coban wrap is resolved, the lateral ulcer is healed. She admits to noncompliance with wearing offloading shoe and admits to glucose levels being greater than 300 most of the week. She continues to smoke and expresses no desire to quit. There is one area medially that probes deeper than it has historically, erythema to the toe and dorsal foot has consistently waxed and waned. There is no overt signs of cellulitis or infection but we will culture the wound for any occult infection given the new  area of depth and erythema. We will hold off on sensitivities for initiation of antibiotic therapy. 02/22/18-She is here in follow up evaluation for left great toe ulcer. There is overall significant improvement in both wound appearance, erythema and edema with changes made last week. She was not initiated on antibiotic therapy. Culture obtained last week showed oxacillin sensitive staph aureus, sensitive to clindamycin. Clindamycin has been called into the pharmacy but she has been instructed to hold off on initiation secondary to overall clinical improvement and her history of antibiotic intolerance. She has been instructed to contact the clinic with any noted  changes/deterioration and the wound, erythema, edema and/or pain. She will follow-up next week. She continues to smoke and her glucose levels remain elevated >250; she admits to compliance with offloading shoe 03/01/18 on evaluation today patient appears to be doing fairly well in regard to her left first toe ulcer. She has been tolerating the dressing changes with the [facility] Dressing without complication and overall this has definitely showed signs of improvement according to records as well is what the patient tells me today. I'[name] very pleased in that regard. She is having no pain today 03/08/18 She is here for follow up evaluation of Heather left great toe ulcer. She remains non-compliant with glucose control and smoking cessation; glucose levels consistently >200. She states that she got new shoe inserts/peg assist. She admits to compliance with offloading. Since my last evaluation there is significant improvement. We will switch to prisma at this time and she will follow up next week. She is noted to be tachycardic at this appointment, heart rate 120s; she has Heather history of heart rate 70-130 according to our records. She admits to extreme agitation r/t personal issues; she was advised to monitor her heartrate and contact her physician if it  does not return to Heather more normal range (<100). She takes cardizem twice daily. 03/15/18-She is here in follow-up evaluation for left great toe ulcer. She remains noncompliant with glucose control and smoking cessation. She admits to compliance with wearing offloading shoe. The ulcer is improved/stable and we will continue with the same treatment plan and she will follow-up next week 03/22/18-She is here for evaluation for left great toe ulcer. There continues to be significant improvement despite recurrent hyperglycemia (over 500 yesterday) and she continues to smoke. She has been compliant with offloading and we will continue with same treatment plan and she will follow-up next week. 03/29/18-She is here for evaluation for left great toe ulcer. Despite continuing to smoke and uncontrolled diabetes she continues to improve. She is compliant with offloading shoe. We will continue with the same treatment plan and she will follow-up next week 04/05/18- She is here in follow up evaluation for Heather left great toe ulcer; she presents with small pustule to left fifth toe (resembles ant bite). She admits to compliance with wearing offloading shoe; continues to smoke or have uncontrolled blood glucose control. There is more callus than usual with evidence of bleeding; she denies known trauma. 04/12/18-She is here for evaluation of left great toe ulcer. Despite noncompliance with glycemic control and smoking she continues to make improvement. She continues to wear offloading shoe. The pustule, that was identified last week, to the left fifth toe is resolved. She will follow-up in 2 weeks 05/03/18-she is seen in follow-up evaluation for Heather left great toe ulcer. She is compliant with offloading, otherwise noncompliant with glycemic control and smoking. She has plateaued and there is minimal improvement noted. We will transition to [facility], replaced the insert to her surgical shoe and she will follow-up in one  week 05/10/18- She is here in follow up evaluation for Heather left great toe ulcer. It appears stable despite measurement change. We will continue with same treatment plan and follow up next week. 05/24/18-She is seen in follow-up evaluation for Heather left great toe ulcer. She remains compliant with offloading, has made significant improvement in her diet, decreasing the amount of sugar/soda. She said her recent A1c was 10.9 which is lower than. She did see Heather diabetic nutritionist/educator yesterday. She continues to smoke. We  will continue with the same treatment plan and she'll follow-up next week. 05/31/18- She is seen in follow-up evaluation for left great toe ulcer. She continues to remain compliant with offloading, continues to make improvement in her diet, increasing her water and decreasing the amount of sugar/soda. She does continue to smoke with no desire to quit. We will apply Prisma to the depth and Hydrofera Blue over. We have not received insurance authorization for oasis. She will follow up next week. 06/07/18-She is seen in follow-up evaluation for left great toe ulcer. It has stalled according to today's measurements although base appears stable. She says she saw Heather diabetic educator yesterday; her average blood sugars are less than 300 which is an improvement for her. She continues to smoke and states "that's my next step" She continues with water over soda. We will order for xray, culture and reinstate ace wrap compression prior to placing apligraf for next week. She is voicing no complaints or concerns. Her dressing will change to iodoflex over the next week in preparation for apligraf. 06/14/18-She is seen in follow-up evaluation for left great toe ulcer. Plain film x-ray performed last week was negative for osteomyelitis. Wound culture obtained last week grew strep B and OSSA; she is initiated on keflex and cefdinir today; there is erythema to the toe which could be from ace wrap compression,  she Isidore, Kadey Heather ([id number]) [id number] Page 5 of 21 has Heather history of wrapping too tight and has has been encouraged to maintain ace wraps that we place today. We will hold off on application of apligraf today, will apply next week after antibiotic therapy has been initiated. She admits today that she has resumed taking Heather shower with her foot/toe submerged in water, she has been reminded to keep foot/toe out of the bath water. She will be seen in follow up next week 06/21/18-she is seen in follow-up evaluation for left great toe ulcer. She is tolerating antibiotic therapy with no GI disturbance. The wound is stable. Apligraf was applied today. She has been decreasing her smoking, only had 4 cigarettes yesterday and 1 today. She continues being more compliant in diabetic diet. She will follow-up next week for evaluation of site, if stable will remove at 2 weeks. 06/28/18- She is here in follow up evalution. Apligraf was placed last week, she states the dressing fell off on Tuesday and she was dressing with hydrofera blue. She is healed and will be discharged from the clinic today. She has been instructed to continue with smoking cessation, continue monitoring glucose levels, offloading for an additional 4 weeks and continue with hydrofera blue for additional two weeks for any possible microscopic opening. Readmission: 08/07/18 on evaluation today patient presents for reevaluation concerning the ulcer of her right great toe. She was previously discharged on 06/28/18 healed. Nonetheless she states that this began to show signs of drainage she subsequently went to her primary care provider. Subsequently an x-ray was performed on 08/01/18 which was negative. The patient was also placed on antibiotics at that time. Fortunately they should have been effective for the infection. Nonetheless she's been experiencing some improvement but still has Heather lot of drainage coming from the  wound itself. 08/14/18 on evaluation today patient's wound actually does show signs of improvement in regard to the erythema at this point. She has completed the antibiotics. With that being said we did discuss the possibility of placing her in Heather total contact cast as of today although I think that I may  want to give this just Heather little bit more time to ensure nothing recurrence as far as her infection is concerned. I do not want to put in the cast and risk infection at that time if things are not completely resolved. With that being said she is gonna require some debridement today. 08/21/18 on evaluation today patient actually appears to be doing okay in regard to her toe ulcer. She's been tolerating the dressing changes without complication. With that being said it does appear that she is ready and in fact I think it's appropriate for [place] to go ahead and initiate the total contact cast today. Nonetheless she will require some sharp debridement to prepare the wound for application. Overall I feel like things have been progressing well but we do need to do something to get this to close more readily. 08/24/18 patient seen today for reevaluation after having had the total contact cast applied on Tuesday. She seems to have done very well the wound appears to be doing great and overall I'[name] pleased with the progress that she's made. There were no abnormal areas of rubbing from the cast on her lower extremity. 08/30/18 on evaluation today patient actually appears to be completely healed in regard to her plantar toe ulcer. She tells me at this point she's been having Heather lot of issues with the cast. She almost fell Heather couple of times the state shall the step of her dog Heather couple times as well. This is been Heather very frustrating process for her other nonetheless she has completely healed the wound which is excellent news. Overall there does not appear to be the evidence of infection at this time which is great  news. 09/11/18 evaluation today patient presents for follow-up concerning her great toe ulcer on the left which has unfortunately reopened since I last saw her which was only Heather couple of weeks ago. Unfortunately she was not able to get in to get the shoe and potentially the AFO that's gonna be necessary due to her left foot drop. She continues with offloading shoe but this is not enough to prevent her from reopening it appears. When we last had her in the total contact cast she did well from Heather healing standpoint but unfortunately the wound reopened as soon as she came out of the cast within just Heather couple of weeks. 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 This is also part of the reason why she falls on Heather regular basis. I just do not believe that is gonna be safe for [place] to reinitiate the total contact cast as last time we had this on she fell 3 times one week which is definitely not normal for her. 09/18/18 upon evaluation today the patient actually appears to be doing about the same in regard to her toe ulcer. She did not contact [employer] as I asked her to even though I had given her the prescription. In fact she actually states that she has no idea where the prescription is. She did apparently call [employer] and they told her that all she needed to do was bring the prescription in order to be able to be seen and work on getting the AFO for her left foot. With all that being said she still does not  have an appointment and I'[name] not sure were things stand that regard. I will give her Heather new prescription today in order to contact them to get this set up. 09/25/18 on evaluation today patient actually appears to be doing about the same in regard to her toes ulcer. She does have Heather small areas which seems to have Heather  lot of callous buildup around the edge of the wound which is going to need sharp debridement today. She still is waiting to be scheduled for evaluation with [employer] for possibility of an AFO. She states there supposed to call her tomorrow to get this set up. Unfortunately it does appear that her foot specifically the toe area is showing signs of erythema. There does not appear to be any systemic infection which is in these good news. 10/02/18 on evaluation today patient actually appears to be doing about the same in regard to her toe ulcer. This really has not done too well although it's not significantly larger it's also not significantly smaller. She has been tolerating the dressing changes without complication. She actually has her appointment with [facility] [place] tomorrow to hopefully be measured for obtaining and AFO splint. I think this would be helpful preventing this from reoccurring. We had contemplated starting the cast this week although to be honest I am reluctant to do that as she's been having nausea, vomiting, and seizure activity over the past three days. She has Heather history of seizures and have been told is nothing that can be done for these. With that being said I do believe that along with the seizures have the nausea vomiting which upon further questioning doesn't seem to be the normal for her and makes me concerned for the possibility of infection or something else going on. I discussed this with the patient and her mother during the office visit today. I do not feel the wound is effective but maybe something else. The responses this was "this just happens to her at times and we don't know why". They did not seem to be interested in going to the hospital to have this checked out further. 10/09/18 on evaluation today patient presents for follow-up concerning her ongoing toe ulcer. She has been tolerating the dressing changes without complication. Fortunately there does not appear  to be any evidence of infection which is great news however I do think that the patient would benefit from going ahead for with the total contact cast. She's actually in Heather wheelchair today she tells me that she will use her walker if we initiate the cast. I was very specific about the fact that if we were gonna do the cast I wanted to make sure that she was using the walker in order to prevent any falls. She tells me she does not have stairs that she has to traverse on Heather regular basis at her home. She has not had any seizures since last week again that something that happens to her often she tells me she did talk to [employer] and they said that it may take up to three weeks to get the brace approved for her. Hopefully that will not take that long but nonetheless in the meantime I do think the cast could be of benefit. 10/12/18 on evaluation today patient appears to be doing rather well in regard to her toe ulcer. It's just been Heather few days and already this is significantly improved both as far as overall appearance and size. Fortunately there's no sign of infection.  She is here for her first obligatory cast change. 10/19/18 Seen today for follow up and management of left great toe ulcer. Wound continues to show improvement. Noted small open area with seroussang drainage with palpation. Denies any increased pain or recent fevers during visit. She will continue calcium alginate with offloading shoe. Denies any questions or concerns during visit. 10/26/18 on evaluation today patient appears to be doing about the same as when I last saw her in regard to her wound bed. Fortunately there does not appear to be any signs of infection. Unfortunately she continues to have Heather breakdown in regard to the toe region any time that she is not in the cast. It takes almost no time at all for this to happen. Nonetheless she still has not heard anything from the brace being made by [employer] as to when exactly this will be  available to her. Fortunately there is no signs of infection at this time. 10/30/18 on evaluation today patient presents for application of the total contact cast as we just received him this morning. Fortunately we are gonna be able to apply this to her today which is great news. She continues to have no significant pain which is good news. Overall I do feel like things have been improving while she was the cast is when she doesn't have Heather cast that things get worse. She still has not really heard anything from [place] regarding her brace. 11/02/18 upon evaluation today patient's wound already appears to be doing significantly better which is good news. Fortunately there does not appear to be any signs of infection also good news. Overall I do think the total contact cast as before is helping to heal this area unfortunately it's just not gonna likely keep the area closed and healed without her getting her brace at least. Again the foot drop is Heather significant issue for her. Heather Heather, Heather Heather ([id number]) [id number] Page 6 of 21 11/09/18 on evaluation today patient appears to be doing excellent in regard to her toe ulcer which in fact is completely healed. Fortunately we finally got the situation squared away with the paperwork which was needed to proceed with getting her brace approved by Medicaid. I have filled that out unfortunately that information has been sent to the orthopedic office that I worked at 2 1/2 years ago and not tired Current wound care measures. Fortunately she seems to be doing very well at this time. 11/23/18 on evaluation today patient appears to be doing More Poorly Compared to Last Time I Saw Her. At [facility] She Had Completely Healed. Currently she is continuing to have issues with reopening. She states that she just found out that the brace was approved through Medicaid now she just has to go get measured in order to have this fitted for her and then  made. Subsequently she does not have an appointment for this yet that is going to complicate things we obviously cannot put her back in the cast if we do not have everything measured because they're not gonna be able to measure her foot while she is in the cast. Unfortunately the other thing that I found out today as well is that she was in the hospital over the weekend due to having Heather heroin overdose. Obviously this is unfortunate and does have me somewhat worried as well. 11/30/18 on evaluation today patient's toe ulcer actually appears to be doing fairly well. The good news is she will be getting her brace in the shoes  next week on Wednesday. Hopefully we will be able to get this to heal without having to go back in the cast however she may need the cast in order to get the wound completely heal and then go from there. Fortunately there's no signs of infection at this time. 12/07/18 on evaluation today patient fortunately did receive her brace and she states she could tell this definitely makes her walk better. With that being said she's been having issues with her toe where she noticed yesterday there was Heather lot of tissue that was loosing off this appears to be much larger than what it was previous. She also states that her leg has been read putting much across the top of her foot just about the ankle although this seems to be receiving somewhat. The total area is still red and appears to be someone infected as best I can tell. She is previously taken Bactrim and that may be Heather good option for her today as well. We are gonna see what I wound culture shows as well and I think that this is definitely appropriate. With that being said outside of the culture I still need to initiate something in the interim and that's what I'[name] gonna go ahead and select Bactrim is Heather good option for her. 12/14/18 on evaluation today patient appears to be doing better in regard to her left great toe ulcer as compared to last  week's evaluation. There's still some erythema although this is significantly improved which is excellent news. Overall I do believe that she is making good progress is still gonna take some time before she is where I would like her to be from the standpoint of being able to place her back into the total contact cast. Hopefully we will be where we need to be by next week. 12/21/18 on evaluation today patient actually appears to be doing poorly in regard to her toe ulcer. She's been tolerating the dressing changes without complication. Fortunately there's no signs of systemic infection although she does have Heather lot of drainage from the toe ulcer and this does seem to be causing some issues at this point. She does have erythema on the distal portion of her toe that appears to be likely cellulitis. 12/28/18 on evaluation today patient actually appears to be doing Heather little better in my pinion in regard to her toe ulcer. With that being said she still does have some evidence of infection at this time and for her culture she had both E. coli as well as enterococcus as organisms noted on evaluation. For that reason I think that though the Keflex likely has treated the E. coli rather well this has really done nothing for the enterococcus. We are going to have to initiate treatment for this specifically. 01/04/19 on evaluation today patient's toe actually appears to be doing better from the standpoint of infection. She currently would like to see about putting the cash back on I think that this is appropriate as long as she takes care of it and keeps it from getting wet. She is gonna have some drainage we can definitely pass this up with Drawtex and alginate to try to prevent as much drainage as possible from causing the problems. With that being said I do want to at least try her with the cast between now and Tuesday. If there any issues we can't continue to use it then I will discontinue the use of the cast at  that point. 01/08/19 on evaluation  today patient actually appears to be doing very well as far as her foot ulcer specifically the great toe on the left is concerned. She did have an area of rubbing on the medial aspect of her left ankle which again is from the cast. Fortunately there's no signs of infection at this point in this appears to be Heather very slight skin breakdown. The patient tells me she felt it rubbing but didn't think it was that bad. Fortunately there is no signs of active infection at this time which is good news. No fevers, chills, nausea, or vomiting noted at this time. 01/15/19 on evaluation today patient actually appears to be doing well in regard to her toe ulcer. Again as previous she seems to do well and she has the cast on which indicates to me that during the time she doesn't have Heather cast on she's putting way too much pressure on this region. Obviously I think that's gonna be an issue as with the current national emergency concerning the Covid-19 Virus it has been recommended that we discontinue the use of total contact casting by the chief medical officer of our company, Dr. Simona Huh. The reasoning is that if Heather patient becomes sick and cannot come into have the cast removed they could not just leave this on for an additional two weeks. Obviously the hospitals also do not want to receive patient's who are sick into the emergency department to potentially contaminate the region and spread the Covid-19 Virus among other sick individuals within the hospital system. Therefore at this point we are suspending the use of total contact cast until the current emergency subsides. This was all discussed with the patient today as well. 01/22/19 on evaluation today patient's wound on her left great toe appears to be doing slightly worse than previously noted last week. She tells me that she has been on this quite Heather bit in fact she tells me she's been awake for 38 straight hours. This is due to the  fact that she's having to care for grandparents because nobody else will. She has been taking care of them for five the last seven days since I've seen her they both have dementia his is from Heather stroke and her grandmother's was progressive. Nonetheless she states even her mom who knows her condition and situation has only help two of those days to take care of them she's been taking care of the rest. Fortunately there does not appear to be any signs of active infection in regard to her toe at this point although obviously it doesn't look as good as it did previous. I think this is directly related to her not taking off the pressure and friction by way of taking things easy. Though I completely understand what's going on. 01/29/19 on evaluation today patient's tools are actually appears to be showing some signs of improvement today compared to last week's evaluation as far as not necessarily the overall size of the wound but the fact that she has some new skin growth in between the two ends of the wound opening. Overall I feel like she has done well she states that she had Heather family member give her what sounds to be Heather CAM walker boot which has been helpful as well. 02/05/19 on evaluation today patient's wound bed actually appears to be doing significantly better in regard to her overall appearance of the size of the wound. With that being said she is still having an issue with offloading efficiently enough  to get this to close. Apparently there is some signs of infection at this point as well unfortunately. Previously she's done well of Augmentin I really do not see anything that needs to be culture currently but there theme and cellulitis of the foot that I'[name] seeing I'[name] gonna go ahead and place her on an antibiotic today to try to help clear this up. 02/12/2019 on evaluation today patient actually appears to be doing poorly in regard to her overall wound status. She tells me she has been using her  offloading shoe but actually comes in today wearing her tennis shoe with the AFO brace. Again as I previously discussed with her this is really not sufficient to allow the area to heal appropriately. Nonetheless she continues to be somewhat noncompliant and I do wonder based on what she has told my nurse in the past as to whether or not Heather good portion of this noncompliance may be recreational drug and alcohol related. She has had Heather history of heroin overdose and this was fairly recently in the past couple of months that have been seeing her. Nonetheless overall I feel like her wound looks significantly worse today compared to what it was previous. She still has significant erythema despite the Augmentin I am not sure that this is an appropriate medication for her infection I am also concerned that the infection may have gone down into her bone. 02/19/19 on evaluation today patient actually appears to be doing about the same in regard to her toe ulcer. Unfortunately she continues to show signs of bone exposure and infection at this point. There does not appear to be any evidence of worsening of the infection but I'[name] also not really sure that it's getting significantly better. She is on the Augmentin which should be sufficient for the Staphylococcus aureus infection that she has at this point. With that being said she may need IV antibiotics to more appropriately treat this. We did have Heather discussion today about hyperbaric option therapy. 02/28/19 on evaluation today patient actually appears to be doing much worse in regard to the wound on her left great toe as compared to even my previous evaluation last week. Unfortunately this seems to be training in Heather pretty poor direction. Her toe was actually now starting to angle laterally and I can actually see the entire joint area of the proximal portion of the digit where is the distal portion of the digit again is no longer even in contact with the joint  line. Unfortunately there's Heather lot more necrotic tissue around the edge and the toe appears to be showing signs of becoming gangrenous in my pinion. I'[name] very concerned about were things stand at this point. She did see infectious disease and they are planning to send in Heather prescription for Sivextro for her and apparently this has been approved. With that being said I don't think she should avoid taking this but at the same time I'[name] not sure that it's gonna be sufficient Heather Heather, Heather Heather ([id number]) [id number] Page 7 of 21 to save her toe at this point. She tells me that she still having to care for grandparents which I think is putting quite Heather bit of strain on her foot and specifically the total area and has caused this to break down even to Heather greater degree than would've otherwise been expected. 03/05/19 on evaluation today patient actually appears to be doing quite well in regard to her toe all things considering. She still has bone exposed  but there appears to be much less your thing on overall the appearance of the wound and the toe itself is dramatically improved. She still does have some issues currently obviously with infection she did see vascular as well and there concerned that her blood flow to the toad. For that reason they are setting up for an angiogram next week. 03/14/19 on evaluation today patient appears to be doing very poor in regard to her toe and specifically in regard to the ulceration and the fact that she's starting to notice the toe was leaning even more towards the lateral aspect and the complete joint is visible on the proximal aspect of the joint. Nonetheless she's also noted Heather significant odor and the tip of the toe is turning more dark and necrotic appearing. Overall I think she is getting worse not better as far as this is concerned. For that reason I am recommending at this point that she likely needs to be seen for likely  amputation. READMISSION 03/19/2021 This is Heather patient that we cared for in this clinic for Heather prolonged period of time in 2019 and 2020 with Heather left foot and left first toe wound. I believe she ultimately became infected and underwent Heather left first toe amputation. 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 Luana [name]. In December 2021 she had an ulcer on her right great toe as well as the fourth and fifth toes. She underwent Heather partial ray amputation of the right fourth and fifth toes. She also had an angiogram at that time and underwent angioplasty of the right anterior tibial artery. In any case she claims that the wound on the right foot is closed I did not look at this today which was probably an oversight although I think that should be done next week. After her surgery she developed Heather dehiscence but I do not see any follow-up of this. According to Dr. Deborra Medina last review that she was out of the area being cared for by another physician but recently came back to his attention. 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 Heather culture of this area showed E. coli apparently before she came back to see Dr. Luana [name] she was supposed to be receiving antibiotics but she did not really take them. Nor is she offloading this area at all. Finally her last hemoglobin A1c listed in epic was in March 2022 at 14.1 she says things are Heather lot better since then although I am not sure. 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 She had this wound at the time. During this admission she had E. coli sepsis Heather TEE was negative. 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 She is still Heather heavy cigarette smoker. She has Heather history of bipolar disorder seizure disorder ABI in our clinic was 1.05 6/1; left midfoot in the setting of Heather TMA done previously. Round circular wound with Heather "knuckle" of protruding tissue. The problem is that the knuckle was not attached to any of the surrounding granulation and this probed proximally widely I removed Heather large portion of this tissue. This wound goes with considerable undermining laterally. I do not feel any bone there  was no purulence but this is Heather deep wound. 6/8; in spite of the debridement I did last week. She arrives with Heather wound looking exactly the same. Heather protruding "knuckle" of tissue nonadherent to most of the surrounding tissue. There is considerable depth around this from 6-12 o'clock at 2.7 cm and undermining of 1 cm. This does not look overtly infected and the x- ray I did last week was negative for any osseous abnormalities. We have been using silver collagen 6/15; deep tissue culture I did last week showed moderate staph aureus and moderate Pseudomonas. This will definitely require prolonged antibiotic therapy. The pathology on the protuberant area was negative for malignancy fungus etc. 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 We have been using silver collagen. I am going to be prescribing Levaquin for 2 weeks. Her CT scan of the foot is down for 7/5 6/22; CT scan of the foot on 7 5. She says she has hardware in the left leg from her previous fracture. She is on the Levaquin for the deep tissue culture I did that showed methicillin sensitive staph aureus and Pseudomonas. I gave her Heather 2-week supply and she will have another week. She arrives in clinic today with the same protuberant tissue however this is nonadherent to the tissue surrounding it. I am really at Heather loss to explain this unless there is underlying deep  tissue infection 6/29; patient presents for 1 week follow-up. She has been using collagen to the wound bed. She reports taking her antibiotics as prescribed.She has no complaints or issues today. She denies signs of infection. 7/6; patient presents for one week followup. She has been using collagen to the wound bed. She states she is taking Levaquin however at times she is not able to keep it down. She denies signs of infection. 7/13; patient presents for 1 week follow-up. She has been using silver alginate to the wound bed. She still has nausea when taking her antibiotics. She denies signs of infection. 7/20; patient presents for 1 week follow-up. She has been using silver alginate with gentamicin cream to the wound bed. She denies any issues and has no complaints today. She denies signs of infection. 7/27; patient presents for 1 week follow-up. She continues to use silver alginate with gentamicin cream to the wound bed. She reports starting her antibiotics. She has no issues or complaints. Overall she reports stability to the wound. 8/3; patient presents for 1 week follow-up. She has been using silver alginate with gentamicin cream to the wound bed. She reports completing all antibiotics. She has no issues or complaints today. She denies signs of infection. 8/17; patient presents for 2-week follow-up. He is to use silver alginate to the wound bed. She has no issues or complaints today. She denies signs of infection. She reports her pain has improved in her foot since last clinic visit 8/24; patient presents for 1 week follow-up. She continues to use silver alginate to the wound bed. She has no issues or complaints. She denies signs of infection. Pain is stable. 9/7; patient presents for follow-up. She missed her last week appointment due to feeling ill. She continues to use silver alginate. She has Heather new wound to the right lower extremity that is covered in eschar. She states It occurred over  the past week and has no idea how it started. She currently denies signs of infection. 9/14; patient presents for follow-up. T the left foot wound she has  been using gentamicin cream and silver alginate. T the right lower extremity wound she has o o been keeping this covered and has not obtain Santyl. 9/21; patient presents for follow-up. She reports using gentamicin cream and silver alginate to the left foot and Santyl to the right lower extremity wound. She has no issues or complaints today. She denies signs of infection. 9/28; patient presents for follow-up. She reports Heather new wound to her right heel. She states this occurred Heather few days ago and is progressively gotten worse. She has been trying to clean the area with Heather Q-tip and Santyl. She reports stability in the other 2 wounds. She has been using gentamicin cream and silver alginate to the left foot and Santyl to the right lower extremity wound. 10/12; patient presents for follow-up. She reports improvement to the wound beds. She is seeing vein and vascular to discuss the potential of Heather left BKA. She states they are going to do an arteriogram. She continues to use silver alginate with dressing changes to her wounds. 11/2; patient presents for follow-up. She states she has not been doing dressing changes to the wound beds. She states she is not able to offload the areas. She reports chronic pain to her left foot wound. 11/9; patient presents for follow-up. She came in with only socks on. She states she forgot to put on shoes. It is unclear if she is doing any dressing changes. She currently denies systemic signs of infection. 11/16; patient presents for follow-up. She came again only with socks on. She states she does not wear shoes ever. It is unclear if she does dressing changes. She currently denies systemic signs of infection. 11/23; patient presents for follow-up. She wore her shoes today. It still unclear exactly what dressing she is  using for each wound but she did states she obtained Dakin's solution and has been using this to the left foot wound. She currently denies signs of infection. 11/30; patient presents for follow-up. She has no issues or complaints today. She currently denies signs of infection. Heather Heather, Heather Heather ([id number]) [id number] Page 8 of 21 12/7; patient presents for follow-up. She has no issues or complaints today. She has been using Hydrofera Blue to the right heel wound and Dakin solution to the left foot wound. Her right anterior leg wound is healed. She currently denies signs of infection. 12/14; patient presents for follow-up. She has been using Hydrofera Blue to the right heel and Dakin's to the left foot wounds. She has no issues or complaints today. She denies signs of infection. 12/21; patient presents for follow-up. She reports using Hydrofera Blue to the right heel and Dakin's to the left foot wound. She denies signs of infection. 12/28; patient presents for follow-up. She continues to use Dakin's to the left foot wound and Hydrofera Blue to the right heel wound. She denies signs of infection. 1/4; patient presents for follow-up. She has no issues or complaints today. She denies signs of infection. 1/11; patient presents for follow-up. It is unclear if she has been dressing these wounds over the past week. She currently denies signs of infection. 1/18; patient presents for follow-up. She states she has been using Dakin's wet-to-dry dressings to the left foot. She has been using Hydrofera Blue to the right foot foot wound. She states that the anterior right leg wound has reopened and draining serous fluid. She denies signs of infection. 1/25; patient presents for follow-up. She has no issues or complaints today. 2/1; patient presents  for follow-up. She has no issues or complaints today. She denies signs of infection. 2/8; patient presents for follow-up. She has lost her  surgical shoes. She did not have Heather dressing to the right heel wound. She currently denies signs of infection. 2/15; patient presents for follow-up. She reports more pain to the right heel today. She denies purulent drainage Or fever/chills 2/22; patient presents for follow-up. She reports taking clindamycin over the past week. She states that she continues to have pain to her right heel. She reports purulent drainage. Readmission 03/16/2022 Ms. Jazlynn Nemetz is Heather 47 year old female with Heather past medical history of type 2 diabetes, osteomyelitis to her feet, chronic systolic heart failure and bipolar disorder that presents to the clinic for bilateral feet wounds and right lower extremity wound. She was last seen in our clinic on 12/15/2021. At that time she had purulent drainage coming out of her right plantar foot and I recommended she go to the ED. She states she went to [facility] and has been there for the past 3 months. I cannot see the records. She states she had OR debridement and was on several weeks of IV antibiotics while inpatient. Since discharge she has not been taking care of the wound beds. She had nothing on her feet other than socks today. She currently denies signs of infection. 5/31; patient presents for follow-up. She has been using Dakin's wet-to-dry dressings to the wound beds on her feet bilaterally and antibiotic ointment to the right anterior leg wound. She had Heather wound culture done at last clinic visit that showed moderate Pseudomonas aeruginosa sensitive to ciprofloxacin. She currently denies systemic signs of infection. 6/14; patient presents for follow-up. She received Keystone 5 days ago and has been using this on the wound beds. She states that last week she had to go to the hospital because she had increased warmth and erythema to the right foot. She was started on 2 oral antibiotics. She states she has been taking these. She currently denies systemic signs of  infection. She has no issues or complaints today. 6/21; patient presents for follow-up. She states she has been using Keystone antibiotics to the wound beds. She has no issues or complaints today. She denies signs of infection. 6/28; patient presents for follow-up. She has been using Keystone antibiotics to the wound beds. She has no issues or complaints today. 7/12; patient presents for follow-up. Has been using Keystone antibiotics to the wound beds with calcium alginate. She has no issues or complaints today. She never followed up with her orthopedic surgeon who did the OR debridement to the right foot. We discussed the total contact cast for the left foot and patient would like to do this next week. 7/19; patient presents for follow-up. She has been using Keystone antibiotics with calcium alginate to the wound beds. She has no issues or complaints today. Patient is in agreement to do the total contact cast of the left foot today. She knows to return later this week for the obligatory cast change. 05-13-2022 upon evaluation today patient's wound which she has the cast of the left leg actually appears to be doing significantly better. Fortunately I do not see any signs of active infection locally or systemically which is great news and overall I am extremely pleased with where we stand currently. 7/26; patient presents for follow-up. She has Heather cast in place for the past week. She states it irritated her shin. Other than that she tolerated the cast well.  She states she would like Heather break for 1 week from the cast. We have been using Keystone antibiotic and Aquacel to both wound beds. She denies signs of infection. 8/2; patient presents for follow-up. She has been using Keystone and Aquacel to the wound beds. She denies any issues and has no complaints. She is agreeable to have the cast placed today for the left leg. 06-03-2022 upon evaluation today patient appears to be doing well with regard to her  wound she saw some signs of improvement which is great news. Fortunately I do not see any evidence of active infection locally or systemically at this time which is great news. No fevers, chills, nausea, vomiting, or diarrhea. 8/16; patient presents for follow-up. She has no issues or complaints today. We have been using Keystone and Aquacel to the wound beds. The left lower extremity is in Heather total contact cast. She is tolerated this well. 8/23; patient presents for follow-up. She has had the total contact cast on the left leg for the past week. Unfortunately this has rubbed and broken down the skin to the medial foot. She currently denies signs of infection. She has been using Keystone antibiotic to the right plantar foot wound. 8/30; patient presents for follow-up. We have held off on the total contact cast for the left leg for the past week. Her wound on the left foot has improved and the previous surrounding breakdown of skin has epithelialized. She has been using Keystone antibiotic to both wound beds. She has no issues or complaints today. She denies signs of infection. 9/6; patient presents for follow-up. She has ordered her's Keystone antibiotic and this is arriving this week. She has been doing Dakin's wet-to-dry dressings to the wound beds. She denies signs of infection. She is agreeable to the total contact cast today. 9/13; patient presents for follow-up. She states that the cast caused her left leg shin to be sore. She would like to take Heather break from the cast this week. She has been using Keystone antibiotic to the right plantar foot wound. She denies signs of infection. 9/20; patient presents for follow-up. She has been using Keystone antibiotics to the wound beds with calcium alginate to the right foot wound and Hydrofera Blue to the left foot wound. She is agreeable to having the cast placed today. She has been approved for Apligraf and we will order this for next clinic visit. 9/27;  patient presents for follow-up. We have been using Keystone antibiotic with Hydrofera Blue to the left foot wound under Heather total contact cast. T the right o HASINA, KREAGER Heather ([id number]) [id number] Page 9 of 21 foot wound she has been using Keystone antibiotic and calcium alginate. She declines Heather total contact cast today. Apligraf is available for placement and she would like to proceed with this. 07-28-2022 upon evaluation today patient appears to be doing well currently in regard to her wound. She is actually showing signs of significant improvement which is great news. Fortunately I do not see any evidence of active infection locally nor systemically at this time. She has been seeing Dr. Heber Crane and to be honest has been doing very well with the cast. Subsequently she comes in today with Heather cast on and we did reapply that today as well. She did not really want to she try to talk me out of that but I explained that if she wanted to heal this is really the right way to go. Patient voiced understanding. In regard to her  right foot this is actually Heather lot better compared to the last time I saw her which is also great news. 10/11; patient presents for follow-up. Apligraf and the total contact cast was placed to the left leg at last clinic visit. She states that her right foot wound had burning pain to it with the placement of Apligraf to this area. She has been doing Troy over this area. She denies signs of infection including increased warmth, erythema or purulent drainage. 11/1; 3-week follow-up. The patient fortunately did not have Heather total contact cast or an Apligraf and on the left foot. She has been using Keystone ABD pads and kerlix and her own running shoes She arrives in clinic today with thick callus and Heather very poor surface on the left foot on the right nonviable skin subcutaneous tissue and Heather deep probing hole. 11/15; patient missed her last clinic appointment. She  states she has not been dressing the wound beds for the past 2 weeks. She states that at she had Heather new roommate but is now going back to live with her mother. Apparently its been Heather distracting 2 weeks. Patient currently denies signs of infection. 11/22; patient presents for follow-up. She states she has been using Keystone antibiotic and Dakin's wet-to-dry dressings to the wound beds. She is agreeable for cast placement today. We had ordered Apligraf however this has not been received by our facility. 11/29; Patient had Heather total contact cast placed at last clinic visit and she tolerated this well. We were using silver alginate under the cast. Patient's been using Keystone antibiotic with Aquacel to the right plantar foot wound. She has no issues or complaints today. Apligraf is available for placement today. Patient would like to proceed with this. 12/6; patient presents for follow-up. She had Apligraf placed in standard fashion last clinic visit under the total contact cast to the left lower extremity. She has been using Keystone antibiotic and Aquacel to the right plantar foot wound. She has no issues or complaints today. 12/13; patient presents for follow-up. She has finished 5 Apligraf placements. Was told she would not qualify for more. We have been doing Heather total contact cast to the left lower extremity. She has been using Keystone antibiotic and Aquacel to the right plantar foot wound. She has no issues or complaints today. 12/20; patient presents for follow-up. We have been using Hydrofera Blue with Keystone antibiotic under Heather total contact cast of the left lower extremity. She reports using Keystone antibiotic and silver alginate to the right heel wound. She has no issues or complaints today. 12/27; patient presents with Heather healthy wound on the left midfoot. We have Apligraf to apply that to that more also using Heather total contact cast. On the right we are using Keystone and silver alginate. She is  offloading the right heel with Heather surgical shoealthough by her admission she is on her feet quite Heather bit 1/3; patient presents for follow-up. Apligraf was placed to the wound beds last clinic visit. She was placed in Heather total contact cast to the left lower extremity. She declines Heather total contact cast today. She states that her mother is in the hospital and she cannot adequately get around with the cast on. 1/10; patient presents for follow-up. She declined the total contact cast at last clinic visit. Both wounds have declined in appearance. She states that she has been on her feet and not offloading the wound beds. She currently denies signs of infection. 1/17; patient presents  for follow-up. She had the total contact cast along with Apligraf placed last week to the left lower extremity. She tolerated this well. She has been using Aquacel Ag and Keystone antibiotic to the right heel wound. She currently denies signs of infection. She has no issues or complaints today. Electronic Signature(s) Signed: 11/09/2022 12:38:47 PM By: Heather Shan DO Entered By: Heather Heather on 11/09/2022 12:14:02 -------------------------------------------------------------------------------- Physical Exam Details Patient Name: Date of Service: Heather Hitch Heather. 11/09/2022 11:00 Heather Heather Medical Record Number: [id number] Patient Account Number: [id number] Date of Birth/Sex: Treating RN: 10-Aug-1976 (47 y.o. Heather Heather Primary Care Provider: Raelene [name] Other Clinician: Massie [name] Referring Provider: Treating Provider/Extender: Heather Heather in Treatment: 55 Constitutional . Cardiovascular . Psychiatric Heather Heather, Heather Heather ([id number]) [id number] Page 10 of 21 . Notes Right foot: T the plantar heel there is an incision site with increased depth, No probing to bone. T the opening there is Nonviable tissue and granulation tissue. o o Left foot: T the medial  aspect there is an open wound with granulation tissue and nonviable tissue with surrounding callus. No overt signs of infection to any o of the wound beds. Electronic Signature(s) Signed: 11/09/2022 12:38:47 PM By: Heather Shan DO Entered By: Heather Heather on 11/09/2022 12:14:35 -------------------------------------------------------------------------------- Physician Orders Details Patient Name: Date of Service: Heather Hitch Heather. 11/09/2022 11:00 Heather Heather Medical Record Number: [id number] Patient Account Number: [id number] Date of Birth/Sex: Treating RN: 11-08-1975 (47 y.o. Heather Heather Primary Care Provider: Raelene [name] Other Clinician: Massie [name] Referring Provider: Treating Provider/Extender: Heather Heather in Treatment: 58 Verbal / Phone Orders: No Diagnosis Coding Follow-up Appointments Return Appointment in 1 week. Nurse Visit as needed Bathing/ Shower/ Hygiene May shower with wound dressing protected with water repellent cover or cast protector. No tub bath. Anesthetic (Use 'Patient Medications' Section for Anesthetic Order Entry) Lidocaine applied to wound bed Cellular or Tissue Based Products Cellular or Tissue Based Product Type: - Apligraf applied Cellular or Tissue Based Product applied to wound bed; including contact layer, fixation with steri-strips, dry gauze and cover dressing. (DO NOT REMOVE). Edema Control - Lymphedema / Segmental Compressive Device / Other Elevate, Exercise Daily and Heather void Standing for Long Periods of Time. Elevate legs to the level of the heart and pump ankles as often as possible Elevate leg(s) parallel to the floor when sitting. Off-Loading Total Contact Cast to Left Lower Extremity Open toe surgical shoe - Heel off loader - Right foot Other: - keep pressure off of feet. Additional Orders / Instructions Follow Nutritious Diet and Increase Protein Intake Other: - Stay off of foot, wheelchair  only Medications-Please add to medication list. Wound #11 Right,Plantar Foot Keystone Compound Wound Treatment Wound #11 - Foot Wound Laterality: Plantar, Right Cleanser: Wound Cleanser 1 x Per Day/30 Days Discharge Instructions: Wash your hands with soap and water. Remove old dressing, discard into plastic bag and place into trash. Cleanse the wound with Wound Cleanser prior to applying Heather clean dressing using gauze sponges, not tissues or cotton balls. Do not scrub or use excessive force. Pat dry using gauze sponges, not tissue or cotton balls. CAROLEENA, PAOLINI Heather ([id number]) [id number] Page 11 of 21 Topical: keystone gel 1 x Per Day/30 Days Prim Dressing: Aquacel Extra Hydrofiber Dressing, 2x2 (in/in) ary 1 x Per Day/30 Days Secondary Dressing: ABD Pad 5x9 (in/in) (Generic) 1 x Per Day/30 Days Discharge Instructions: Cover with ABD pad Secured With: Medipore T - 63M Medipore H Soft  Cloth Surgical T ape ape, 2x2 (in/yd) 1 x Per Day/30 Days Secured With: Kerlix Roll Sterile or Non-Sterile 6-ply 4.5x4 (yd/yd) 1 x Per Day/30 Days Discharge Instructions: Apply Kerlix as directed Wound #12 - Foot Wound Laterality: Plantar, Left, Medial Cleanser: Wound Cleanser 1 x Per Day/30 Days Discharge Instructions: Wash your hands with soap and water. Remove old dressing, discard into plastic bag and place into trash. Cleanse the wound with Wound Cleanser prior to applying Heather clean dressing using gauze sponges, not tissues or cotton balls. Do not scrub or use excessive force. Pat dry using gauze sponges, not tissue or cotton balls. Peri-Wound Care: Triamcinolone Acetonide Cream, 0.1%, 15 (g) tube 1 x Per Day/30 Days Discharge Instructions: Apply as directed to irritated areas. Secondary Dressing: ABD Pad 5x9 (in/in) 1 x Per Day/30 Days Discharge Instructions: Cover with ABD pad Secondary Dressing: Foam Dressing, 4x4 (in/in) 1 x Per Day/30 Days Discharge Instructions: used for  padding ankle and forefoot. Electronic Signature(s) Signed: 11/09/2022 12:38:47 PM By: Heather Shan DO Entered By: Heather Heather on 11/09/2022 12:16:22 -------------------------------------------------------------------------------- Problem List Details Patient Name: Date of Service: Heather Hitch Heather. 11/09/2022 11:00 Heather Heather Medical Record Number: [id number] Patient Account Number: [id number] Date of Birth/Sex: Treating RN: 1976-08-06 (47 y.o. 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 03/16/2022 No Yes E11.621 Type 2 diabetes mellitus with foot ulcer 03/16/2022 No Yes E11.42 Type 2 diabetes mellitus with diabetic polyneuropathy 03/16/2022 No Yes Heather Heather, Heather Heather ([id number]) [id number] Page 12 of 21 L97.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 03/16/2022 No Yes of skin Inactive Problems Resolved Problems Electronic Signature(s) Signed: 11/09/2022 12:38:47 PM By: Heather Shan DO Entered By: Heather Heather on 11/09/2022 12:13:22 -------------------------------------------------------------------------------- Progress Note Details Patient Name: Date of Service: Heather Hitch Heather. 11/09/2022 11:00 Heather Heather Medical Record Number: [id number] Patient Account Number: [id number] Date of Birth/Sex: Treating RN: 03/29/1976 (47 y.o. Heather Heather Primary Care Provider: Raelene [name] Other Clinician: Massie [name] Referring Provider: Treating Provider/Extender: Heather Heather in  Treatment: 71 Subjective Chief Complaint Information obtained from Patient 03/19/2021; patient referred by Dr. Luana [name] who has been looking after her left foot for quite Heather period of time for review of Heather nonhealing area in the left midfoot 03/12/2022; bilateral feet wounds and right lower extremity wound. History of Present Illness (HPI) 01/18/18-She is here for initial evaluation of the left great toe ulcer. She is Heather poor historian in regards to timeframe in detail. She states approximately 4 weeks ago she lacerated her toe on something in the house. She followed up with her primary care who placed her on Bactrim and ultimately Heather second dose of Bactrim prior to coming to wound clinic. She states she has been treating the toe with peroxide, Betadine and Heather Band-Aid. She did not check her blood sugar this morning but checked it yesterday morning it was 327; she is unaware of Heather recent A1c and there are no current records. She saw Dr. she would've orthopedics last week for an old injury to the left ankle, she states he did not see her toe, nor did she bring it  to his attention. She smokes approximately 1 pack cigarettes Heather day. Her social situation is concerning, she arrives this morning with her mother who appears extremely intoxicated/under the influence; her mother was asked to leave the room and be monitored by the patient's grandmother. The patient's aunt then accompanied the patient and the room throughout the rest of the appointment. We had Heather lengthy discussion regarding the deleterious effects of uncontrolled hyperglycemia and smoking as it relates to wound healing and overall health. She was strongly encouraged to decrease her smoking and get her diabetes under better control. She states she is currently on Heather diet and has cut down her [facility] consumption. The left toe is erythematous, macerated and slightly edematous with malodor present. The edema in her left foot is below her baseline, there is  no erythema streaking. We will treat her with Santyl, doxycycline; we have ordered and xray, culture and provided Heather Peg assist surgical shoe and cultured the wound. 01/25/18-She is here in follow-up evaluation for Heather left great toe ulcer and presents with an abscess to her suprapubic area. She states her blood sugars remain elevated, feeling "sick" and if levels are below 250, but she is trying. She has made no attempt to decrease her smoking stating that we "can't take away her food in her cigarettes". She has been compliant with offloading using the PEG assist you. She is using Santyl daily. the culture obtained last week grew staph aureus and Enterococcus faecalis; continues on the doxycycline and Augmentin was added on Monday. The suprapubic area has erythema, no femoral variation, purple discoloration, minimal induration, was accessed with Heather cotton tip applicator with sanguinopurulent drainage, this was cultured, I suspect the current antibiotic treatment will cover and we will not add anything to her current treatment plan. She was advised to go to urgent care or ER with any change in redness, induration or fever. 02/01/18-She is here in follow-up evaluation for left great toe ulcers and Heather new abdominal abscess from last week. She was able to use packing until earlier this week, where she "forgot it was there". She states she was feeling ill with GI symptoms last week and was not taking her antibiotic. She states her glucose levels have been predominantly less than 200, with occasional levels between 200-250. She thinks this was contributing to her GI symptoms as they have resolved without intervention. There continues to be significant laceration to left toe, otherwise it clinically looks stable/improved. There is now less superficial opening to the lateral aspect of the great toe that was residual blister. We will transition to [facility] to all wounds, she will continue her Augmentin. If there  is no change or deterioration next week for reculture. 02/08/18-She is here in follow-up evaluation for left great toe ulcer and abdominal ulcer. There is an improvement in both wounds. She has been wrapping her left toe with coban, not by our direction, which has created an area of discoloration to the medial aspect; she has been advised to NOT use coban secondary to her neuropathy. She states her glucose levels have been high over this last week ranging from 200-350, she continues to smoke. She admits to being less compliant with her offloading shoe. We will continue with same treatment plan and she will follow-up next week. 02/15/18-She is here in follow-up evaluation for left great toe ulcer and abdominal ulcer. The abdominal ulcer is epithelialized. The left great toe ulcer is improved and all injury from last week using the Coban  wrap is resolved, the lateral ulcer is healed. She admits to noncompliance with wearing offloading shoe and admits to glucose levels being greater than 300 most of the week. She continues to smoke and expresses no desire to quit. There is one area medially that probes deeper than it has historically, erythema to the toe and dorsal foot has consistently waxed and waned. There is no overt signs of cellulitis or infection Heather Heather, BAUNE Heather ([id number]) [id number] Page 13 of 21 but we will culture the wound for any occult infection given the new area of depth and erythema. We will hold off on sensitivities for initiation of antibiotic therapy. 02/22/18-She is here in follow up evaluation for left great toe ulcer. There is overall significant improvement in both wound appearance, erythema and edema with changes made last week. She was not initiated on antibiotic therapy. Culture obtained last week showed oxacillin sensitive staph aureus, sensitive to clindamycin. Clindamycin has been called into the pharmacy but she has been instructed to hold off on  initiation secondary to overall clinical improvement and her history of antibiotic intolerance. She has been instructed to contact the clinic with any noted changes/deterioration and the wound, erythema, edema and/or pain. She will follow-up next week. She continues to smoke and her glucose levels remain elevated >250; she admits to compliance with offloading shoe 03/01/18 on evaluation today patient appears to be doing fairly well in regard to her left first toe ulcer. She has been tolerating the dressing changes with the [facility] Dressing without complication and overall this has definitely showed signs of improvement according to records as well is what the patient tells me today. I'[name] very pleased in that regard. She is having no pain today 03/08/18 She is here for follow up evaluation of Heather left great toe ulcer. She remains non-compliant with glucose control and smoking cessation; glucose levels consistently >200. She states that she got new shoe inserts/peg assist. She admits to compliance with offloading. Since my last evaluation there is significant improvement. We will switch to prisma at this time and she will follow up next week. She is noted to be tachycardic at this appointment, heart rate 120s; she has Heather history of heart rate 70-130 according to our records. She admits to extreme agitation r/t personal issues; she was advised to monitor her heartrate and contact her physician if it does not return to Heather more normal range (<100). She takes cardizem twice daily. 03/15/18-She is here in follow-up evaluation for left great toe ulcer. She remains noncompliant with glucose control and smoking cessation. She admits to compliance with wearing offloading shoe. The ulcer is improved/stable and we will continue with the same treatment plan and she will follow-up next week 03/22/18-She is here for evaluation for left great toe ulcer. There continues to be significant improvement despite recurrent  hyperglycemia (over 500 yesterday) and she continues to smoke. She has been compliant with offloading and we will continue with same treatment plan and she will follow-up next week. 03/29/18-She is here for evaluation for left great toe ulcer. Despite continuing to smoke and uncontrolled diabetes she continues to improve. She is compliant with offloading shoe. We will continue with the same treatment plan and she will follow-up next week 04/05/18- She is here in follow up evaluation for Heather left great toe ulcer; she presents with small pustule to left fifth toe (resembles ant bite). She admits to compliance with wearing offloading shoe; continues to smoke or have uncontrolled blood glucose control. There is  more callus than usual with evidence of bleeding; she denies known trauma. 04/12/18-She is here for evaluation of left great toe ulcer. Despite noncompliance with glycemic control and smoking she continues to make improvement. She continues to wear offloading shoe. The pustule, that was identified last week, to the left fifth toe is resolved. She will follow-up in 2 weeks 05/03/18-she is seen in follow-up evaluation for Heather left great toe ulcer. She is compliant with offloading, otherwise noncompliant with glycemic control and smoking. She has plateaued and there is minimal improvement noted. We will transition to [facility], replaced the insert to her surgical shoe and she will follow-up in one week 05/10/18- She is here in follow up evaluation for Heather left great toe ulcer. It appears stable despite measurement change. We will continue with same treatment plan and follow up next week. 05/24/18-She is seen in follow-up evaluation for Heather left great toe ulcer. She remains compliant with offloading, has made significant improvement in her diet, decreasing the amount of sugar/soda. She said her recent A1c was 10.9 which is lower than. She did see Heather diabetic nutritionist/educator yesterday. She continues to  smoke. We will continue with the same treatment plan and she'll follow-up next week. 05/31/18- She is seen in follow-up evaluation for left great toe ulcer. She continues to remain compliant with offloading, continues to make improvement in her diet, increasing her water and decreasing the amount of sugar/soda. She does continue to smoke with no desire to quit. We will apply Prisma to the depth and Hydrofera Blue over. We have not received insurance authorization for oasis. She will follow up next week. 06/07/18-She is seen in follow-up evaluation for left great toe ulcer. It has stalled according to today's measurements although base appears stable. She says she saw Heather diabetic educator yesterday; her average blood sugars are less than 300 which is an improvement for her. She continues to smoke and states "that's my next step" She continues with water over soda. We will order for xray, culture and reinstate ace wrap compression prior to placing apligraf for next week. She is voicing no complaints or concerns. Her dressing will change to iodoflex over the next week in preparation for apligraf. 06/14/18-She is seen in follow-up evaluation for left great toe ulcer. Plain film x-ray performed last week was negative for osteomyelitis. Wound culture obtained last week grew strep B and OSSA; she is initiated on keflex and cefdinir today; there is erythema to the toe which could be from ace wrap compression, she has Heather history of wrapping too tight and has has been encouraged to maintain ace wraps that we place today. We will hold off on application of apligraf today, will apply next week after antibiotic therapy has been initiated. She admits today that she has resumed taking Heather shower with her foot/toe submerged in water, she has been reminded to keep foot/toe out of the bath water. She will be seen in follow up next week 06/21/18-she is seen in follow-up evaluation for left great toe ulcer. She is tolerating  antibiotic therapy with no GI disturbance. The wound is stable. Apligraf was applied today. She has been decreasing her smoking, only had 4 cigarettes yesterday and 1 today. She continues being more compliant in diabetic diet. She will follow-up next week for evaluation of site, if stable will remove at 2 weeks. 06/28/18- She is here in follow up evalution. Apligraf was placed last week, she states the dressing fell off on Tuesday and she was dressing  with hydrofera blue. She is healed and will be discharged from the clinic today. She has been instructed to continue with smoking cessation, continue monitoring glucose levels, offloading for an additional 4 weeks and continue with hydrofera blue for additional two weeks for any possible microscopic opening. Readmission: 08/07/18 on evaluation today patient presents for reevaluation concerning the ulcer of her right great toe. She was previously discharged on 06/28/18 healed. Nonetheless she states that this began to show signs of drainage she subsequently went to her primary care provider. Subsequently an x-ray was performed on 08/01/18 which was negative. The patient was also placed on antibiotics at that time. Fortunately they should have been effective for the infection. Nonetheless she's been experiencing some improvement but still has Heather lot of drainage coming from the wound itself. 08/14/18 on evaluation today patient's wound actually does show signs of improvement in regard to the erythema at this point. She has completed the antibiotics. With that being said we did discuss the possibility of placing her in Heather total contact cast as of today although I think that I may want to give this just Heather little bit more time to ensure nothing recurrence as far as her infection is concerned. I do not want to put in the cast and risk infection at that time if things are not completely resolved. With that being said she is gonna require some debridement  today. 08/21/18 on evaluation today patient actually appears to be doing okay in regard to her toe ulcer. She's been tolerating the dressing changes without complication. With that being said it does appear that she is ready and in fact I think it's appropriate for [place] to go ahead and initiate the total contact cast today. Nonetheless she will require some sharp debridement to prepare the wound for application. Overall I feel like things have been progressing well but we do need to do something to get this to close more readily. 08/24/18 patient seen today for reevaluation after having had the total contact cast applied on Tuesday. She seems to have done very well the wound appears to be doing great and overall I'[name] pleased with the progress that she's made. There were no abnormal areas of rubbing from the cast on her lower extremity. 08/30/18 on evaluation today patient actually appears to be completely healed in regard to her plantar toe ulcer. She tells me at this point she's been having Heather lot of issues with the cast. She almost fell Heather couple of times the state shall the step of her dog Heather couple times as well. This is been Heather very frustrating process for her other nonetheless she has completely healed the wound which is excellent news. Overall there does not appear to be the evidence of infection at this time which is great news. 09/11/18 evaluation today patient presents for follow-up concerning her great toe ulcer on the left which has unfortunately reopened since I last saw her which was only Heather couple of weeks ago. Unfortunately she was not able to get in to get the shoe and potentially the AFO that's gonna be necessary due to her left foot drop. She continues with offloading shoe but this is not enough to prevent her from reopening it appears. When we last had her in the total contact cast she did well from Heather healing standpoint but unfortunately the wound reopened as soon as she came out of the cast  within just Heather couple of weeks. Right now the biggest concern is that  I do believe the foot drop is leading to the issue and this is gonna continue to be an issue unfortunately until we get things under control as far as the walking anomaly is concerned with the foot drop. This is also part of the reason why she falls on Heather regular basis. I just do not believe that is gonna be safe for [place] to reinitiate the total contact cast as last time we had this on she fell 3 times one week which is definitely not normal for her. 09/18/18 upon evaluation today the patient actually appears to be doing about the same in regard to her toe ulcer. She did not contact [employer] as I asked her to even though I had given her the prescription. In fact she actually states that she has no idea where the prescription is. She did apparently call [employer] and they told her that all she needed to do was bring the prescription in order to be able to be seen and work on getting the AFO for her left foot. With all that being said she still does not have an appointment and I'[name] not sure were things stand that regard. I will give her Heather new prescription today in order to contact them to [place], MARQUART Heather ([id number]) [id number] Page 14 of 21 get this set up. 09/25/18 on evaluation today patient actually appears to be doing about the same in regard to her toes ulcer. She does have Heather small areas which seems to have Heather lot of callous buildup around the edge of the wound which is going to need sharp debridement today. She still is waiting to be scheduled for evaluation with [employer] for possibility of an AFO. She states there supposed to call her tomorrow to get this set up. Unfortunately it does appear that her foot specifically the toe area is showing signs of erythema. There does not appear to be any systemic infection which is in these good news. 10/02/18 on evaluation today patient actually appears to be doing about  the same in regard to her toe ulcer. This really has not done too well although it's not significantly larger it's also not significantly smaller. She has been tolerating the dressing changes without complication. She actually has her appointment with [facility] and [facility] tomorrow to hopefully be measured for obtaining and AFO splint. I think this would be helpful preventing this from reoccurring. We had contemplated starting the cast this week although to be honest I am reluctant to do that as she's been having nausea, vomiting, and seizure activity over the past three days. She has Heather history of seizures and have been told is nothing that can be done for these. With that being said I do believe that along with the seizures have the nausea vomiting which upon further questioning doesn't seem to be the normal for her and makes me concerned for the possibility of infection or something else going on. I discussed this with the patient and her mother during the office visit today. I do not feel the wound is effective but maybe something else. The responses this was "this just happens to her at times and we don't know why". They did not seem to be interested in going to the hospital to have this checked out further. 10/09/18 on evaluation today patient presents for follow-up concerning her ongoing toe ulcer. She has been tolerating the dressing changes without complication. Fortunately there does not appear to be any evidence of infection which is great news  however I do think that the patient would benefit from going ahead for with the total contact cast. She's actually in Heather wheelchair today she tells me that she will use her walker if we initiate the cast. I was very specific about the fact that if we were gonna do the cast I wanted to make sure that she was using the walker in order to prevent any falls. She tells me she does not have stairs that she has to traverse on Heather regular basis at her home. She has  not had any seizures since last week again that something that happens to her often she tells me she did talk to [employer] and they said that it may take up to three weeks to get the brace approved for her. Hopefully that will not take that long but nonetheless in the meantime I do think the cast could be of benefit. 10/12/18 on evaluation today patient appears to be doing rather well in regard to her toe ulcer. It's just been Heather few days and already this is significantly improved both as far as overall appearance and size. Fortunately there's no sign of infection. She is here for her first obligatory cast change. 10/19/18 Seen today for follow up and management of left great toe ulcer. Wound continues to show improvement. Noted small open area with seroussang drainage with palpation. Denies any increased pain or recent fevers during visit. She will continue calcium alginate with offloading shoe. Denies any questions or concerns during visit. 10/26/18 on evaluation today patient appears to be doing about the same as when I last saw her in regard to her wound bed. Fortunately there does not appear to be any signs of infection. Unfortunately she continues to have Heather breakdown in regard to the toe region any time that she is not in the cast. It takes almost no time at all for this to happen. Nonetheless she still has not heard anything from the brace being made by [employer] as to when exactly this will be available to her. Fortunately there is no signs of infection at this time. 10/30/18 on evaluation today patient presents for application of the total contact cast as we just received him this morning. Fortunately we are gonna be able to apply this to her today which is great news. She continues to have no significant pain which is good news. Overall I do feel like things have been improving while she was the cast is when she doesn't have Heather cast that things get worse. She still has not really heard anything from  [facility] regarding her brace. 11/02/18 upon evaluation today patient's wound already appears to be doing significantly better which is good news. Fortunately there does not appear to be any signs of infection also good news. Overall I do think the total contact cast as before is helping to heal this area unfortunately it's just not gonna likely keep the area closed and healed without her getting her brace at least. Again the foot drop is Heather significant issue for her. 11/09/18 on evaluation today patient appears to be doing excellent in regard to her toe ulcer which in fact is completely healed. Fortunately we finally got the situation squared away with the paperwork which was needed to proceed with getting her brace approved by Medicaid. I have filled that out unfortunately that information has been sent to the orthopedic office that I worked at 2 1/2 years ago and not tired Current wound care measures. Fortunately she seems to  be doing very well at this time. 11/23/18 on evaluation today patient appears to be doing More Poorly Compared to Last Time I Saw Her. At [facility] She Had Completely Healed. Currently she is continuing to have issues with reopening. She states that she just found out that the brace was approved through Medicaid now she just has to go get measured in order to have this fitted for her and then made. Subsequently she does not have an appointment for this yet that is going to complicate things we obviously cannot put her back in the cast if we do not have everything measured because they're not gonna be able to measure her foot while she is in the cast. Unfortunately the other thing that I found out today as well is that she was in the hospital over the weekend due to having Heather heroin overdose. Obviously this is unfortunate and does have me somewhat worried as well. 11/30/18 on evaluation today patient's toe ulcer actually appears to be doing fairly well. The good news is she will be  getting her brace in the shoes next week on Wednesday. Hopefully we will be able to get this to heal without having to go back in the cast however she may need the cast in order to get the wound completely heal and then go from there. Fortunately there's no signs of infection at this time. 12/07/18 on evaluation today patient fortunately did receive her brace and she states she could tell this definitely makes her walk better. With that being said she's been having issues with her toe where she noticed yesterday there was Heather lot of tissue that was loosing off this appears to be much larger than what it was previous. She also states that her leg has been read putting much across the top of her foot just about the ankle although this seems to be receiving somewhat. The total area is still red and appears to be someone infected as best I can tell. She is previously taken Bactrim and that may be Heather good option for her today as well. We are gonna see what I wound culture shows as well and I think that this is definitely appropriate. With that being said outside of the culture I still need to initiate something in the interim and that's what I'[name] gonna go ahead and select Bactrim is Heather good option for her. 12/14/18 on evaluation today patient appears to be doing better in regard to her left great toe ulcer as compared to last week's evaluation. There's still some erythema although this is significantly improved which is excellent news. Overall I do believe that she is making good progress is still gonna take some time before she is where I would like her to be from the standpoint of being able to place her back into the total contact cast. Hopefully we will be where we need to be by next week. 12/21/18 on evaluation today patient actually appears to be doing poorly in regard to her toe ulcer. She's been tolerating the dressing changes without complication. Fortunately there's no signs of systemic infection although  she does have Heather lot of drainage from the toe ulcer and this does seem to be causing some issues at this point. She does have erythema on the distal portion of her toe that appears to be likely cellulitis. 12/28/18 on evaluation today patient actually appears to be doing Heather little better in my pinion in regard to her toe ulcer. With that being  said she still does have some evidence of infection at this time and for her culture she had both E. coli as well as enterococcus as organisms noted on evaluation. For that reason I think that though the Keflex likely has treated the E. coli rather well this has really done nothing for the enterococcus. We are going to have to initiate treatment for this specifically. 01/04/19 on evaluation today patient's toe actually appears to be doing better from the standpoint of infection. She currently would like to see about putting the cash back on I think that this is appropriate as long as she takes care of it and keeps it from getting wet. She is gonna have some drainage we can definitely pass this up with Drawtex and alginate to try to prevent as much drainage as possible from causing the problems. With that being said I do want to at least try her with the cast between now and Tuesday. If there any issues we can't continue to use it then I will discontinue the use of the cast at that point. 01/08/19 on evaluation today patient actually appears to be doing very well as far as her foot ulcer specifically the great toe on the left is concerned. She did have an area of rubbing on the medial aspect of her left ankle which again is from the cast. Fortunately there's no signs of infection at this point in this appears to be Heather very slight skin breakdown. The patient tells me she felt it rubbing but didn't think it was that bad. Fortunately there is no signs of active infection at this time which is good news. No fevers, chills, nausea, or vomiting noted at this time. Heather Heather, Heather Heather ([id number]) [id number] Page 15 of 21 01/15/19 on evaluation today patient actually appears to be doing well in regard to her toe ulcer. Again as previous she seems to do well and she has the cast on which indicates to me that during the time she doesn't have Heather cast on she's putting way too much pressure on this region. Obviously I think that's gonna be an issue as with the current national emergency concerning the Covid-19 Virus it has been recommended that we discontinue the use of total contact casting by the chief medical officer of our company, Dr. Simona Huh. The reasoning is that if Heather patient becomes sick and cannot come into have the cast removed they could not just leave this on for an additional two weeks. Obviously the hospitals also do not want to receive patient's who are sick into the emergency department to potentially contaminate the region and spread the Covid-19 Virus among other sick individuals within the hospital system. Therefore at this point we are suspending the use of total contact cast until the current emergency subsides. This was all discussed with the patient today as well. 01/22/19 on evaluation today patient's wound on her left great toe appears to be doing slightly worse than previously noted last week. She tells me that she has been on this quite Heather bit in fact she tells me she's been awake for 38 straight hours. This is due to the fact that she's having to care for grandparents because nobody else will. She has been taking care of them for five the last seven days since I've seen her they both have dementia his is from Heather stroke and her grandmother's was progressive. Nonetheless she states even her mom who knows her condition and situation has only help two of  those days to take care of them she's been taking care of the rest. Fortunately there does not appear to be any signs of active infection in regard to her toe at this point  although obviously it doesn't look as good as it did previous. I think this is directly related to her not taking off the pressure and friction by way of taking things easy. Though I completely understand what's going on. 01/29/19 on evaluation today patient's tools are actually appears to be showing some signs of improvement today compared to last week's evaluation as far as not necessarily the overall size of the wound but the fact that she has some new skin growth in between the two ends of the wound opening. Overall I feel like she has done well she states that she had Heather family member give her what sounds to be Heather CAM walker boot which has been helpful as well. 02/05/19 on evaluation today patient's wound bed actually appears to be doing significantly better in regard to her overall appearance of the size of the wound. With that being said she is still having an issue with offloading efficiently enough to get this to close. Apparently there is some signs of infection at this point as well unfortunately. Previously she's done well of Augmentin I really do not see anything that needs to be culture currently but there theme and cellulitis of the foot that I'[name] seeing I'[name] gonna go ahead and place her on an antibiotic today to try to help clear this up. 02/12/2019 on evaluation today patient actually appears to be doing poorly in regard to her overall wound status. She tells me she has been using her offloading shoe but actually comes in today wearing her tennis shoe with the AFO brace. Again as I previously discussed with her this is really not sufficient to allow the area to heal appropriately. Nonetheless she continues to be somewhat noncompliant and I do wonder based on what she has told my nurse in the past as to whether or not Heather good portion of this noncompliance may be recreational drug and alcohol related. She has had Heather history of heroin overdose and this was fairly recently in the past couple of  months that have been seeing her. Nonetheless overall I feel like her wound looks significantly worse today compared to what it was previous. She still has significant erythema despite the Augmentin I am not sure that this is an appropriate medication for her infection I am also concerned that the infection may have gone down into her bone. 02/19/19 on evaluation today patient actually appears to be doing about the same in regard to her toe ulcer. Unfortunately she continues to show signs of bone exposure and infection at this point. There does not appear to be any evidence of worsening of the infection but I'[name] also not really sure that it's getting significantly better. She is on the Augmentin which should be sufficient for the Staphylococcus aureus infection that she has at this point. With that being said she may need IV antibiotics to more appropriately treat this. We did have Heather discussion today about hyperbaric option therapy. 02/28/19 on evaluation today patient actually appears to be doing much worse in regard to the wound on her left great toe as compared to even my previous evaluation last week. Unfortunately this seems to be training in Heather pretty poor direction. Her toe was actually now starting to angle laterally and I can actually see the entire joint  area of the proximal portion of the digit where is the distal portion of the digit again is no longer even in contact with the joint line. Unfortunately there's Heather lot more necrotic tissue around the edge and the toe appears to be showing signs of becoming gangrenous in my pinion. I'[name] very concerned about were things stand at this point. She did see infectious disease and they are planning to send in Heather prescription for Sivextro for her and apparently this has been approved. With that being said I don't think she should avoid taking this but at the same time I'[name] not sure that it's gonna be sufficient to save her toe at this point. She tells me that  she still having to care for grandparents which I think is putting quite Heather bit of strain on her foot and specifically the total area and has caused this to break down even to Heather greater degree than would've otherwise been expected. 03/05/19 on evaluation today patient actually appears to be doing quite well in regard to her toe all things considering. She still has bone exposed but there appears to be much less your thing on overall the appearance of the wound and the toe itself is dramatically improved. She still does have some issues currently obviously with infection she did see vascular as well and there concerned that her blood flow to the toad. For that reason they are setting up for an angiogram next week. 03/14/19 on evaluation today patient appears to be doing very poor in regard to her toe and specifically in regard to the ulceration and the fact that she's starting to notice the toe was leaning even more towards the lateral aspect and the complete joint is visible on the proximal aspect of the joint. Nonetheless she's also noted Heather significant odor and the tip of the toe is turning more dark and necrotic appearing. Overall I think she is getting worse not better as far as this is concerned. For that reason I am recommending at this point that she likely needs to be seen for likely amputation. READMISSION 03/19/2021 This is Heather patient that we cared for in this clinic for Heather prolonged period of time in 2019 and 2020 with Heather left foot and left first toe wound. I believe she ultimately became infected and underwent Heather left first toe amputation. 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 Luana [name]. In December 2021 she had an ulcer on her right great toe as well as the fourth and fifth toes. She underwent Heather partial ray amputation of the right fourth and fifth toes. She also had an angiogram at that time and underwent angioplasty of the right anterior tibial artery. In  any case she claims that the wound on the right foot is closed I did not look at this today which was probably an oversight although I think that should be done next week. After her surgery she developed Heather dehiscence but I do not see any follow-up of this. According to Dr. Deborra Medina last review that she was out of the area being cared for by another physician but recently came back to his attention. 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 Heather culture of this area showed E. coli apparently before she came back to see Dr. Luana [name] she was supposed to be receiving antibiotics but she did not really take them. Nor is she offloading this area at all. Finally her last hemoglobin  A1c listed in epic was in March 2022 at 14.1 she says things are Heather lot better since then although I am not sure. 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 She had this wound at the time. During this admission she had E. coli sepsis Heather TEE was negative. 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 She is still Heather heavy cigarette smoker. She has Heather history of bipolar disorder seizure disorder ABI in our clinic was 1.05 6/1; left midfoot in the setting of Heather TMA done previously. Round circular wound with Heather "knuckle" of protruding tissue. The problem is that the knuckle was not attached to any of the surrounding granulation and this probed proximally widely I removed Heather large portion of this tissue. This wound goes with considerable undermining laterally. I do not feel any bone there was no purulence but this is Heather deep wound. 6/8; in spite of the debridement I did last week. She arrives with Heather  wound looking exactly the same. Heather protruding "knuckle" of tissue nonadherent to most of the surrounding tissue. There is considerable depth around this from 6-12 o'clock at 2.7 cm and undermining of 1 cm. This does not look overtly infected and the x- ray I did last week was negative for any osseous abnormalities. We have been using silver collagen 6/15; deep tissue culture I did last week showed moderate staph aureus and moderate Pseudomonas. This will definitely require prolonged antibiotic therapy. The pathology on the protuberant area was negative for malignancy fungus etc. 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 We have been using silver collagen. I am going to be prescribing Levaquin for 2 weeks. Her CT scan of the foot is down for 7/5 6/22; CT scan of the foot on 7 5. She says she has hardware in the left leg from her previous fracture. She is on the Levaquin for the deep tissue culture I did that showed methicillin sensitive staph aureus and Pseudomonas. I gave her Heather 2-week supply and she will have another week. She arrives in clinic today with the same protuberant tissue however this is nonadherent to the tissue surrounding it. I am really at Heather loss to explain this unless there is underlying deep tissue infection DAJAH, FISCHMAN Heather ([id number]) [id number] Page 16 of 21 6/29; patient presents for 1 week follow-up. She has been using collagen to the wound bed. She reports taking her antibiotics as prescribed.She has no complaints or issues today. She denies signs of infection. 7/6; patient presents for one week followup. She has been using collagen to the wound bed. She states she is taking Levaquin however at times she is not able to keep it down. She denies signs of infection. 7/13; patient presents for 1 week follow-up. She has been using silver alginate to the wound bed. She still has nausea when taking  her antibiotics. She denies signs of infection. 7/20; patient presents for 1 week follow-up. She has been using silver alginate with gentamicin cream to the wound bed. She denies any issues and has no complaints today. She denies signs of infection. 7/27; patient presents for 1 week follow-up. She continues to use silver alginate with gentamicin cream to the wound  bed. She reports starting her antibiotics. She has no issues or complaints. Overall she reports stability to the wound. 8/3; patient presents for 1 week follow-up. She has been using silver alginate with gentamicin cream to the wound bed. She reports completing all antibiotics. She has no issues or complaints today. She denies signs of infection. 8/17; patient presents for 2-week follow-up. He is to use silver alginate to the wound bed. She has no issues or complaints today. She denies signs of infection. She reports her pain has improved in her foot since last clinic visit 8/24; patient presents for 1 week follow-up. She continues to use silver alginate to the wound bed. She has no issues or complaints. She denies signs of infection. Pain is stable. 9/7; patient presents for follow-up. She missed her last week appointment due to feeling ill. She continues to use silver alginate. She has Heather new wound to the right lower extremity that is covered in eschar. She states It occurred over the past week and has no idea how it started. She currently denies signs of infection. 9/14; patient presents for follow-up. T the left foot wound she has been using gentamicin cream and silver alginate. T the right lower extremity wound she has o o been keeping this covered and has not obtain Santyl. 9/21; patient presents for follow-up. She reports using gentamicin cream and silver alginate to the left foot and Santyl to the right lower extremity wound. She has no issues or complaints today. She denies signs of infection. 9/28; patient presents for  follow-up. She reports Heather new wound to her right heel. She states this occurred Heather few days ago and is progressively gotten worse. She has been trying to clean the area with Heather Q-tip and Santyl. She reports stability in the other 2 wounds. She has been using gentamicin cream and silver alginate to the left foot and Santyl to the right lower extremity wound. 10/12; patient presents for follow-up. She reports improvement to the wound beds. She is seeing vein and vascular to discuss the potential of Heather left BKA. She states they are going to do an arteriogram. She continues to use silver alginate with dressing changes to her wounds. 11/2; patient presents for follow-up. She states she has not been doing dressing changes to the wound beds. She states she is not able to offload the areas. She reports chronic pain to her left foot wound. 11/9; patient presents for follow-up. She came in with only socks on. She states she forgot to put on shoes. It is unclear if she is doing any dressing changes. She currently denies systemic signs of infection. 11/16; patient presents for follow-up. She came again only with socks on. She states she does not wear shoes ever. It is unclear if she does dressing changes. She currently denies systemic signs of infection. 11/23; patient presents for follow-up. She wore her shoes today. It still unclear exactly what dressing she is using for each wound but she did states she obtained Dakin's solution and has been using this to the left foot wound. She currently denies signs of infection. 11/30; patient presents for follow-up. She has no issues or complaints today. She currently denies signs of infection. 12/7; patient presents for follow-up. She has no issues or complaints today. She has been using Hydrofera Blue to the right heel wound and Dakin solution to the left foot wound. Her right anterior leg wound is healed. She currently denies signs of infection. 12/14; patient presents  for follow-up.  She has been using Hydrofera Blue to the right heel and Dakin's to the left foot wounds. She has no issues or complaints today. She denies signs of infection. 12/21; patient presents for follow-up. She reports using Hydrofera Blue to the right heel and Dakin's to the left foot wound. She denies signs of infection. 12/28; patient presents for follow-up. She continues to use Dakin's to the left foot wound and Hydrofera Blue to the right heel wound. She denies signs of infection. 1/4; patient presents for follow-up. She has no issues or complaints today. She denies signs of infection. 1/11; patient presents for follow-up. It is unclear if she has been dressing these wounds over the past week. She currently denies signs of infection. 1/18; patient presents for follow-up. She states she has been using Dakin's wet-to-dry dressings to the left foot. She has been using Hydrofera Blue to the right foot foot wound. She states that the anterior right leg wound has reopened and draining serous fluid. She denies signs of infection. 1/25; patient presents for follow-up. She has no issues or complaints today. 2/1; patient presents for follow-up. She has no issues or complaints today. She denies signs of infection. 2/8; patient presents for follow-up. She has lost her surgical shoes. She did not have Heather dressing to the right heel wound. She currently denies signs of infection. 2/15; patient presents for follow-up. She reports more pain to the right heel today. She denies purulent drainage Or fever/chills 2/22; patient presents for follow-up. She reports taking clindamycin over the past week. She states that she continues to have pain to her right heel. She reports purulent drainage. Readmission 03/16/2022 Ms. Deyja Sochacki is Heather 47 year old female with Heather past medical history of type 2 diabetes, osteomyelitis to her feet, chronic systolic heart failure and bipolar disorder that presents to the clinic  for bilateral feet wounds and right lower extremity wound. She was last seen in our clinic on 12/15/2021. At that time she had purulent drainage coming out of her right plantar foot and I recommended she go to the ED. She states she went to [facility] and has been there for the past 3 months. I cannot see the records. She states she had OR debridement and was on several weeks of IV antibiotics while inpatient. Since discharge she has not been taking care of the wound beds. She had nothing on her feet other than socks today. She currently denies signs of infection. 5/31; patient presents for follow-up. She has been using Dakin's wet-to-dry dressings to the wound beds on her feet bilaterally and antibiotic ointment to the right anterior leg wound. She had Heather wound culture done at last clinic visit that showed moderate Pseudomonas aeruginosa sensitive to ciprofloxacin. She currently denies systemic signs of infection. 6/14; patient presents for follow-up. She received Keystone 5 days ago and has been using this on the wound beds. She states that last week she had to go to the hospital because she had increased warmth and erythema to the right foot. She was started on 2 oral antibiotics. She states she has been taking these. She currently denies systemic signs of infection. She has no issues or complaints today. 6/21; patient presents for follow-up. She states she has been using Keystone antibiotics to the wound beds. She has no issues or complaints today. She denies signs of infection. BUFFIE, HERNE Heather ([id number]) [id number] Page 17 of 21 6/28; patient presents for follow-up. She has been using Keystone antibiotics to the wound beds. She  has no issues or complaints today. 7/12; patient presents for follow-up. Has been using Keystone antibiotics to the wound beds with calcium alginate. She has no issues or complaints today. She never followed up with her orthopedic  surgeon who did the OR debridement to the right foot. We discussed the total contact cast for the left foot and patient would like to do this next week. 7/19; patient presents for follow-up. She has been using Keystone antibiotics with calcium alginate to the wound beds. She has no issues or complaints today. Patient is in agreement to do the total contact cast of the left foot today. She knows to return later this week for the obligatory cast change. 05-13-2022 upon evaluation today patient's wound which she has the cast of the left leg actually appears to be doing significantly better. Fortunately I do not see any signs of active infection locally or systemically which is great news and overall I am extremely pleased with where we stand currently. 7/26; patient presents for follow-up. She has Heather cast in place for the past week. She states it irritated her shin. Other than that she tolerated the cast well. She states she would like Heather break for 1 week from the cast. We have been using Keystone antibiotic and Aquacel to both wound beds. She denies signs of infection. 8/2; patient presents for follow-up. She has been using Keystone and Aquacel to the wound beds. She denies any issues and has no complaints. She is agreeable to have the cast placed today for the left leg. 06-03-2022 upon evaluation today patient appears to be doing well with regard to her wound she saw some signs of improvement which is great news. Fortunately I do not see any evidence of active infection locally or systemically at this time which is great news. No fevers, chills, nausea, vomiting, or diarrhea. 8/16; patient presents for follow-up. She has no issues or complaints today. We have been using Keystone and Aquacel to the wound beds. The left lower extremity is in Heather total contact cast. She is tolerated this well. 8/23; patient presents for follow-up. She has had the total contact cast on the left leg for the past week.  Unfortunately this has rubbed and broken down the skin to the medial foot. She currently denies signs of infection. She has been using Keystone antibiotic to the right plantar foot wound. 8/30; patient presents for follow-up. We have held off on the total contact cast for the left leg for the past week. Her wound on the left foot has improved and the previous surrounding breakdown of skin has epithelialized. She has been using Keystone antibiotic to both wound beds. She has no issues or complaints today. She denies signs of infection. 9/6; patient presents for follow-up. She has ordered her's Keystone antibiotic and this is arriving this week. She has been doing Dakin's wet-to-dry dressings to the wound beds. She denies signs of infection. She is agreeable to the total contact cast today. 9/13; patient presents for follow-up. She states that the cast caused her left leg shin to be sore. She would like to take Heather break from the cast this week. She has been using Keystone antibiotic to the right plantar foot wound. She denies signs of infection. 9/20; patient presents for follow-up. She has been using Keystone antibiotics to the wound beds with calcium alginate to the right foot wound and Hydrofera Blue to the left foot wound. She is agreeable to having the cast placed today. She has been  approved for Apligraf and we will order this for next clinic visit. 9/27; patient presents for follow-up. We have been using Keystone antibiotic with Hydrofera Blue to the left foot wound under Heather total contact cast. T the right o foot wound she has been using Keystone antibiotic and calcium alginate. She declines Heather total contact cast today. Apligraf is available for placement and she would like to proceed with this. 07-28-2022 upon evaluation today patient appears to be doing well currently in regard to her wound. She is actually showing signs of significant improvement which is great news. Fortunately I do not see any  evidence of active infection locally nor systemically at this time. She has been seeing Dr. Heber [place] and to be honest has been doing very well with the cast. Subsequently she comes in today with Heather cast on and we did reapply that today as well. She did not really want to she try to talk me out of that but I explained that if she wanted to heal this is really the right way to go. Patient voiced understanding. In regard to her right foot this is actually Heather lot better compared to the last time I saw her which is also great news. 10/11; patient presents for follow-up. Apligraf and the total contact cast was placed to the left leg at last clinic visit. She states that her right foot wound had burning pain to it with the placement of Apligraf to this area. She has been doing Kimberly over this area. She denies signs of infection including increased warmth, erythema or purulent drainage. 11/1; 3-week follow-up. The patient fortunately did not have Heather total contact cast or an Apligraf and on the left foot. She has been using Keystone ABD pads and kerlix and her own running shoes She arrives in clinic today with thick callus and Heather very poor surface on the left foot on the right nonviable skin subcutaneous tissue and Heather deep probing hole. 11/15; patient missed her last clinic appointment. She states she has not been dressing the wound beds for the past 2 weeks. She states that at she had Heather new roommate but is now going back to live with her mother. Apparently its been Heather distracting 2 weeks. Patient currently denies signs of infection. 11/22; patient presents for follow-up. She states she has been using Keystone antibiotic and Dakin's wet-to-dry dressings to the wound beds. She is agreeable for cast placement today. We had ordered Apligraf however this has not been received by our facility. 11/29; Patient had Heather total contact cast placed at last clinic visit and she tolerated this well. We were using silver alginate  under the cast. Patient's been using Keystone antibiotic with Aquacel to the right plantar foot wound. She has no issues or complaints today. Apligraf is available for placement today. Patient would like to proceed with this. 12/6; patient presents for follow-up. She had Apligraf placed in standard fashion last clinic visit under the total contact cast to the left lower extremity. She has been using Keystone antibiotic and Aquacel to the right plantar foot wound. She has no issues or complaints today. 12/13; patient presents for follow-up. She has finished 5 Apligraf placements. Was told she would not qualify for more. We have been doing Heather total contact cast to the left lower extremity. She has been using Keystone antibiotic and Aquacel to the right plantar foot wound. She has no issues or complaints today. 12/20; patient presents for follow-up. We have been using Hydrofera Blue with  Keystone antibiotic under Heather total contact cast of the left lower extremity. She reports using Keystone antibiotic and silver alginate to the right heel wound. She has no issues or complaints today. 12/27; patient presents with Heather healthy wound on the left midfoot. We have Apligraf to apply that to that more also using Heather total contact cast. On the right we are using Keystone and silver alginate. She is offloading the right heel with Heather surgical shoealthough by her admission she is on her feet quite Heather bit 1/3; patient presents for follow-up. Apligraf was placed to the wound beds last clinic visit. She was placed in Heather total contact cast to the left lower extremity. She declines Heather total contact cast today. She states that her mother is in the hospital and she cannot adequately get around with the cast on. 1/10; patient presents for follow-up. She declined the total contact cast at last clinic visit. Both wounds have declined in appearance. She states that she has been on her feet and not offloading the wound beds. She currently  denies signs of infection. 1/17; patient presents for follow-up. She had the total contact cast along with Apligraf placed last week to the left lower extremity. She tolerated this well. She has been using Aquacel Ag and Keystone antibiotic to the right heel wound. She currently denies signs of infection. She has no issues or complaints today. MASIE, BERMINGHAM Heather ([id number]) [id number] Page 18 of 21 Objective Constitutional Vitals Time Taken: 11:14 AM, Height: 69 in, Weight: 178 lbs, BMI: 26.3, Temperature: 98.1 F, Pulse: 60 bpm, Respiratory Rate: 16 breaths/min, Blood Pressure: 122/79 mmHg. General Notes: Right foot: T the plantar heel there is an incision site with increased depth, No probing to bone. T the opening there is Nonviable tissue and o o granulation tissue. Left foot: T the medial aspect there is an open wound with granulation tissue and nonviable tissue with surrounding callus. No overt signs of o infection to any of the wound beds. Integumentary (Hair, Skin) Wound #11 status is Open. Original cause of wound was Surgical Injury. The date acquired was: 12/01/2021. The wound has been in treatment 34 weeks. The wound is located on the [place]. The wound measures 0.3cm length x 0.4cm width x 0.9cm depth; 0.094cm^2 area and 0.085cm^3 volume. There is Fat Layer (Subcutaneous Tissue) exposed. There is tunneling at 12:00 with Heather maximum distance of 0.9cm. There is Heather medium amount of serosanguineous drainage noted. The wound margin is distinct with the outline attached to the wound base. There is medium (34-66%) pale granulation within the wound bed. There is Heather medium (34-66%) amount of necrotic tissue within the wound bed. Wound #12 status is Open. Original cause of wound was Pressure Injury. The date acquired was: 03/16/2020. The wound has been in treatment 34 weeks. The wound is located on the [place]. The wound measures 1.7cm length x  0.9cm width x 0.1cm depth; 1.202cm^2 area and 0.12cm^3 volume. There is Fat Layer (Subcutaneous Tissue) exposed. There is Heather medium amount of serous drainage noted. The wound margin is flat and intact. There is small (1- 33%) red, pink granulation within the wound bed. There is Heather large (67-100%) amount of necrotic tissue within the wound bed including Adherent Slough. 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 I debrided nonviable tissue and Apligraf was placed in standard fashion under the total contact cast. I debrided nonviable tissue to the right foot wound. This area is stable but does appear to be decreasing in depth. I recommended continuing with [facility] antibiotic and Aquacel Ag. Continue aggressive offloading with peg assist and surgical shoe. Follow-up in 1 week. Procedures Wound #11 Pre-procedure diagnosis of Wound #11 is an Open Surgical Wound located on the Right,Plantar Foot . There was Heather Excisional Skin/Subcutaneous Tissue Debridement with Heather total area of 1 sq cm performed by Heather Shan, MD. With the following instrument(s): Curette to remove Viable and Non-Viable tissue/material. Material removed includes Callus, Subcutaneous Tissue, and Slough. Heather time out was conducted at 11:49, prior to the start of the procedure. Heather Minimum amount of bleeding was controlled with Pressure. The procedure was tolerated well. Post Debridement Measurements: 0.5cm length x 0.5cm width x 0.9cm depth; 0.177cm^3 volume. Character of Wound/Ulcer Post Debridement is  stable. Post procedure Diagnosis Wound #11: Same as Pre-Procedure Wound #12 Pre-procedure diagnosis of Wound #12 is Heather Diabetic Wound/Ulcer of the Lower Extremity located on the Left,Medial,Plantar Foot .Severity of Tissue Pre Debridement is: Fat layer exposed. There was Heather Excisional Skin/Subcutaneous Tissue Debridement with Heather total area of 9 sq cm performed by Heather Shan, MD. With the following instrument(s): Curette to remove Viable and Non-Viable tissue/material. Material removed includes Callus, Subcutaneous Tissue, and Slough. Heather time out was conducted at 11:44, prior to the start of the procedure. Heather Minimum amount of bleeding was controlled with Pressure. The procedure was tolerated well. Post Debridement Measurements: 1.7cm length x 0.9cm width x 0.1cm depth; 0.12cm^3 volume. Character of Wound/Ulcer Post Debridement is stable. Severity of Tissue Post Debridement is: Fat layer exposed. Post procedure Diagnosis Wound #12: Same as Pre-Procedure Pre-procedure diagnosis of Wound #12 is Heather Diabetic Wound/Ulcer of the Lower Extremity located on the Left,Medial,Plantar Foot. Heather skin graft procedure using Heather bioengineered skin substitute/cellular or tissue based product was performed by Heather Shan, MD with the following instrument(s): Blade and Scissors. Apligraf was applied and secured with Steri-Strips. 34 sq cm of product was utilized and 10 sq cm was wasted due to wound size. Post Application, bolster was applied. Heather Time Out was conducted at 11:54, prior to the start of the procedure. The procedure was tolerated well. Post procedure Diagnosis Wound #12: Same as Pre-Procedure . Pre-procedure diagnosis of Wound #12 is Heather Diabetic Wound/Ulcer of the Lower Extremity located on the Left,Medial,Plantar Foot . There was Heather T Contact otal Cast Procedure by Heather Shan, MD. Post procedure Diagnosis Wound #12: Same as Pre-Procedure ERAN, WINDISH Heather ([id number])  [id number] Page 19 of 21 Plan Follow-up Appointments: Return Appointment in 1 week. Nurse Visit as needed Bathing/ Shower/ Hygiene: May shower with wound dressing protected with water repellent cover or cast protector. No tub bath. Anesthetic (Use 'Patient Medications' Section for Anesthetic Order Entry): Lidocaine applied to wound bed Cellular or Tissue Based Products: Cellular or Tissue Based Product Type: - Apligraf applied Cellular or Tissue Based Product applied to wound bed; including contact layer, fixation with steri-strips, dry gauze and cover dressing. (DO NOT REMOVE). Edema Control - Lymphedema / Segmental Compressive Device / Other: Elevate, Exercise Daily and Avoid Standing for Long Periods of Time. Elevate legs to the level of the heart and  pump ankles as often as possible Elevate leg(s) parallel to the floor when sitting. Off-Loading: T Contact Cast to Left Lower Extremity otal Open toe surgical shoe - Heel off loader - Right foot Other: - keep pressure off of feet. Additional Orders / Instructions: Follow Nutritious Diet and Increase Protein Intake Other: - Stay off of foot, wheelchair only Medications-Please add to medication list.: Wound #11 Right,Plantar Foot: Keystone Compound WOUND #11: - Foot Wound Laterality: Plantar, Right Cleanser: Wound Cleanser 1 x Per Day/30 Days Discharge Instructions: Wash your hands with soap and water. Remove old dressing, discard into plastic bag and place into trash. Cleanse the wound with Wound Cleanser prior to applying Heather clean dressing using gauze sponges, not tissues or cotton balls. Do not scrub or use excessive force. Pat dry using gauze sponges, not tissue or cotton balls. Topical: keystone gel 1 x Per Day/30 Days Prim Dressing: Aquacel Extra Hydrofiber Dressing, 2x2 (in/in) 1 x Per Day/30 Days ary Secondary Dressing: ABD Pad 5x9 (in/in) (Generic) 1 x Per Day/30 Days Discharge Instructions:  Cover with ABD pad Secured With: Medipore T - 82M Medipore H Soft Cloth Surgical T ape ape, 2x2 (in/yd) 1 x Per Day/30 Days Secured With: [employer] Sterile or Non-Sterile 6-ply 4.5x4 (yd/yd) 1 x Per Day/30 Days Discharge Instructions: Apply Kerlix as directed WOUND #12: - Foot Wound Laterality: Plantar, Left, Medial Cleanser: Wound Cleanser 1 x Per Day/30 Days Discharge Instructions: Wash your hands with soap and water. Remove old dressing, discard into plastic bag and place into trash. Cleanse the wound with Wound Cleanser prior to applying Heather clean dressing using gauze sponges, not tissues or cotton balls. Do not scrub or use excessive force. Pat dry using gauze sponges, not tissue or cotton balls. Peri-Wound Care: Triamcinolone Acetonide Cream, 0.1%, 15 (g) tube 1 x Per Day/30 Days Discharge Instructions: Apply as directed to irritated areas. Secondary Dressing: ABD Pad 5x9 (in/in) 1 x Per Day/30 Days Discharge Instructions: Cover with ABD pad Secondary Dressing: Foam Dressing, 4x4 (in/in) 1 x Per Day/30 Days Discharge Instructions: used for padding ankle and forefoot. 1. In office sharp debridement 2. Apligraf placed in standard fashion 3. T contact cast placed in standard fashion otal 4. Aquacel Ag and Keystone antibiotic ointment 5. Aggressive offloading 6. Follow-up in 1 week Electronic Signature(s) Signed: 11/09/2022 12:38:47 PM By: Heather Shan DO Entered By: Heather Heather on 11/09/2022 12:15:55 Heather Heather ([id number]) [id number] Page 20 of 21 -------------------------------------------------------------------------------- Total Contact Cast Details Patient Name: Date of Service: BLEU, MOISAN 11/09/2022 11:00 Heather Heather Medical Record Number: [id number] Patient Account Number: [id number] Date of Birth/Sex: Treating RN: [birth year] (47 y.o. Heather Heather Primary Care Provider: Raelene [name] Other Clinician: Massie [name] Referring  Provider: Treating Provider/Extender: Heather Heather in Treatment: 78 T Contact Cast Applied for Wound Assessment: otal Wound #12 Left,Medial,Plantar Foot Performed By: Physician Heather Shan, MD Post Procedure Diagnosis Same as Pre-procedure Electronic Signature(s) Signed: 11/09/2022 12:38:47 PM By: Heather Shan DO Signed: 11/11/2022 9:50:53 AM By: Heather Heather Entered By: Heather Heather on 11/09/2022 11:52:42 -------------------------------------------------------------------------------- SuperBill Details Patient Name: Date of Service: Heather Hitch Heather. 11/09/2022 Medical Record Number: [id number] Patient Account Number: [id number] Date of Birth/Sex: Treating RN: Jun 20, 1976 (47 y.o. Heather Heather Primary Care Provider: Raelene [name] Other Clinician: Massie [name] Referring Provider: Treating Provider/Extender: Heather Heather in Treatment: 34 Diagnosis Coding ICD-10 Codes Code Description [phone] 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ity  P37.902 Non-pressure chronic ulcer of other part of right foot with fat layer exposed E11.621 Type 2 diabetes mellitus with foot ulcer E11.42 Type 2 diabetes mellitus with diabetic polyneuropathy L97.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 of skin Facility Procedures : CPT4 Code: [id number] Description: [phone] (Facility Use Only) Apligraf 44 SQ CM Modifier: Quantity: 44 : CPT4 Code: [id number] Description: 22979 - SKIN SUB GRAFT FACE/NK/HF/G ICD-10 Diagnosis Description 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 E11.621 Type 2 diabetes mellitus with foot ulcer Modifier: ity Quantity: 1 : CPT4 Code: 89211941 Description: 74081 - DEB SUBQ TISSUE 20 SQ CM/< ICD-10 Diagnosis Description L97.512 Non-pressure chronic ulcer of other part of right foot with fat  layer exposed E11.621 Type 2 diabetes mellitus with foot ulcer Modifier: Quantity: 1 Physician Procedures : CPT4 Code Description Modifier [id number] 31497 - WC PHYS SKIN SUB GRAFT FACE/NK/HF/G ELLINA, SIVERTSEN Heather ([id number]) [id number] ICD-10 Diagnosis Description L97.528 Non-pressure chronic ulcer of other part of left foot with other  specified severity E11.621 Type 2 diabetes mellitus with foot ulcer Quantity: 1 17.pdf Page 21 of 21 : [id number] 11042 - WC PHYS SUBQ TISS 20 SQ CM 1 ICD-10 Diagnosis Description 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.621 Type 2 diabetes mellitus with foot ulcer Quantity: Electronic Signature(s) Signed: 11/09/2022 12:38:47 PM By: Heather Shan DO Entered By: Heather Heather on 11/09/2022 12:16:12

## 2022-11-11 NOTE — Progress Notes (Signed)
Heather Heather Heather ([id number]) [id number] Page 1 of 11 Visit Report for 11/09/2022 Arrival Information Details Patient Name: Date of Service: Heather Heather 11/09/2022 11:00 Heather Heather Medical Record Number: [id number] Patient Account Number: [id number] Date of Birth/Sex: Treating RN: 10/24/1976 (47 y.o. Heather Heather Primary Care Heather Heather: Heather Heather Other Clinician: Massie [name] Referring Heather Heather: Treating Heather Heather/Extender: Heather Heather in Treatment: 44 Visit Information History Since Last Visit All ordered tests and consults were completed: No Patient Arrived: Wheel Chair Added or deleted any medications: No Arrival Time: 11:12 Any new allergies or adverse reactions: No Transfer Assistance: EasyPivot Patient Lift Had Heather fall or experienced change in No Patient Identification Verified: Yes activities of daily living that may affect Secondary Verification Process Completed: Yes risk of falls: Patient Requires Transmission-Based Precautions: No Signs or symptoms of abuse/neglect since No Patient Has Alerts: No last visito Hospitalized since last visit: No Implantable device outside of the clinic No excluding cellular tissue based products placed in the center since last visit: Has Dressing in Place as Prescribed: Yes Has Footwear/Offloading in Place as Yes Prescribed: Left: Surgical Shoe with Pressure Relief Insole Pain Present Now: Yes Electronic Signature(s) Signed: 11/11/2022 9:50:53 AM By: Heather Heather Entered By: Heather Heather on 11/09/2022 11:13:01 -------------------------------------------------------------------------------- Clinic Level of Care Assessment Details Patient Name: Date of Service: Heather Heather, Heather Heather 11/09/2022 11:00 Heather Heather Medical Record Number: [id number] Patient Account Number: [id number] Date of Birth/Sex: Treating RN: 1976/07/28 (47 y.o. Heather Heather Primary Care Heather Heather: Heather Heather Other Clinician: Massie [name] Referring Heather Heather: Treating Heather Heather: Heather Heather in Treatment: [facility] Level of Care Assessment Items TOOL 1 Quantity Score DHARA, SCHEPP Heather ([id number]) [id number] Page 2 of 11 []  - 0 Use when EandM and Procedure is performed on INITIAL visit ASSESSMENTS - Nursing Assessment / Reassessment []  - 0 General Physical Exam (combine w/ comprehensive assessment (listed just below) when performed on new pt. evals) []  - 0 Comprehensive Assessment (HX, ROS, Risk Assessments, Wounds Hx, etc.) ASSESSMENTS - Wound and Skin Assessment / Reassessment []  - 0 Dermatologic / Skin Assessment (not related to wound area) ASSESSMENTS - Ostomy and/or Continence Assessment and Care []  - 0 Incontinence Assessment and Management []  - 0 Ostomy Care Assessment and Management (repouching, etc.) PROCESS - Coordination of Care []  - 0 Simple Patient / Family Education for ongoing care []  - 0 Complex (extensive) Patient / Family Education for ongoing care []  - 0 Staff obtains Programmer, systems, Records, T Results / Process Orders est []  - 0 Staff telephones HHA, Nursing Homes / Clarify orders / etc []  - 0 Routine Transfer to another Facility (non-emergent condition) []  - 0 Routine Hospital Admission (non-emergent condition) []  - 0 New Admissions / Biomedical engineer / Ordering NPWT Apligraf, etc. , []  - 0 Emergency Hospital Admission (emergent condition) PROCESS - Special Needs []  - 0 Pediatric / Minor Patient Management []  - 0 Isolation Patient Management []  - 0 Hearing / Language / Visual special needs []  - 0 Assessment of Community assistance (transportation, D/C planning, etc.) []  - 0 Additional assistance / Altered mentation []  - 0 Support Surface(s) Assessment (bed, cushion, seat, etc.) INTERVENTIONS - Miscellaneous []  - 0 External ear exam []  - 0 Patient Transfer (multiple staff /  Civil Service fast streamer / Similar devices) []  - 0 Simple Staple / Suture removal (25 or less) []  - 0 Complex Staple / Suture removal (26 or more) []  - 0 Hypo/Hyperglycemic Management (do not check if billed separately) []  -  0 Ankle / Brachial Index (ABI) - do not check if billed separately Has the patient been seen at the hospital within the last three years: Yes Total Score: 0 Level Of Care: ____ Electronic Signature(s) Signed: 11/11/2022 9:50:53 AM By: Heather Heather Entered By: Heather Heather on 11/09/2022 11:58:43 Encounter Discharge Information Details -------------------------------------------------------------------------------- Heather Heather ([id number]) [id number] Page 3 of 11 Patient Name: Date of Service: Heather Heather, Heather Heather 11/09/2022 11:00 Heather Heather Medical Record Number: [id number] Patient Account Number: [id number] Date of Birth/Sex: Treating RN: 02/24/1976 (47 y.o. Heather Heather Primary Care Edker Punt: Heather Heather Other Clinician: Massie [name] Referring Narciso Stoutenburg: Treating Heather Heather: Heather Heather in Treatment: 6 Encounter Discharge Information Items Post Procedure Vitals Discharge Condition: Stable Temperature (F): 98.1 Ambulatory Status: Wheelchair Pulse (bpm): 60 Discharge Destination: Home Respiratory Rate (breaths/min): 16 Transportation: Private Auto Blood Pressure (mmHg): 122/79 Accompanied By: mother Schedule Follow-up Appointment: Yes Clinical Summary of Care: Electronic Signature(s) Signed: 11/11/2022 9:50:53 AM By: Heather Heather Entered By: Heather Heather on 11/09/2022 12:35:58 -------------------------------------------------------------------------------- Lower Extremity Assessment Details Patient Name: Date of Service: Heather Heather, Heather Heather 11/09/2022 11:00 Heather Heather Medical Record Number: [id number] Patient Account Number: [id number] Date of Birth/Sex: Treating RN: 1976/07/16 (47 y.o. Heather Heather Primary  Care Nyelli Samara: Heather Heather Other Clinician: Massie [name] Referring Heather Heather: Treating Heather Heather: Heather Heather Weeks in Treatment: 34 Edema Assessment Assessed: [Left: Yes] [Right: No] Edema: [Left: Ye] [Right: s] Calf Left: Right: Point of Measurement: 30 cm From Medial Instep 27 cm Ankle Left: Right: Point of Measurement: 9 cm From Medial Instep 22 cm Vascular Assessment Pulses: Dorsalis Pedis Palpable: [Left:Yes] Electronic Signature(s) Signed: 11/09/2022 11:43:47 PM By: Gretta Cool, BSN, RN, CWS, [name] RN, BSN Signed: 11/11/2022 9:50:53 AM By: Heather Heather Entered By: Heather Heather on 11/09/2022 11:33:14 Nile Dear Heather ([id number]) [id number] Page 4 of 11 -------------------------------------------------------------------------------- Multi Wound Chart Details Patient Name: Date of Service: Heather Heather, Heather Heather 11/09/2022 11:00 Heather Heather Medical Record Number: [id number] Patient Account Number: [id number] Date of Birth/Sex: Treating RN: 1976-03-31 (47 y.o. 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 Etiology: Extremity Chronic sinus problems/congestion, Chronic sinus problems/congestion, N/Heather Comorbid History: Middle ear problems, Anemia, Chronic Middle ear problems, Anemia, Chronic Obstructive Pulmonary Disease Obstructive Pulmonary Disease (COPD), Congestive Heart Failure, (COPD), Congestive Heart  Failure, Type II Diabetes, End Stage Renal Type II Diabetes, End Stage Renal Disease, History of pressure wounds, Disease, History of pressure wounds, Neuropathy Neuropathy 12/01/2021 03/16/2020 N/Heather Date Acquired: 64 34 N/Heather Weeks of Treatment: Open Open N/Heather Wound Status: No No N/Heather Wound Recurrence: 0.3x0.4x0.9 1.7x0.9x0.1 N/Heather Measurements L x W x D (cm) 0.094 1.202 N/Heather Heather (cm) : rea 0.085 0.12 N/Heather Volume (cm) : 94.90% 74.50% N/Heather % Reduction in Heather rea: 94.90% 87.30% N/Heather % Reduction in Volume: 12 Position 1 (o'clock): 0.9 Maximum Distance 1 (cm): Yes N/Heather N/Heather Tunneling: Full Thickness Without Exposed Grade 3 N/Heather Classification: Support Structures Medium Medium N/Heather Exudate Amount: Serosanguineous Serous N/Heather Exudate Type: red, brown amber N/Heather Exudate Color: Distinct, outline attached Flat and Intact N/Heather Wound Margin: Medium (34-66%) Small (1-33%) N/Heather Granulation Amount: Pale  Red, Pink N/Heather Granulation Quality: Medium (34-66%) Large (67-100%) N/Heather Necrotic Amount: Fat Layer (Subcutaneous Tissue): Yes Fat Layer (Subcutaneous Tissue): Yes N/Heather Exposed Structures: Fascia: No Fascia: No Tendon: No Tendon: No Muscle: No Muscle: No Joint: No Joint: No Bone: No Bone: No Medium (34-66%) None N/Heather Epithelialization: Heather Heather, Heather Heather ([id number]) [id number] Page 5 of 11 Treatment Notes Electronic Signature(s) Signed: 11/11/2022 9:50:53 AM By: Heather Heather Entered By: Heather Heather on 11/09/2022 11:33:21 -------------------------------------------------------------------------------- Multi-Disciplinary Care Plan Details Patient Name: Date of Service: Heather Hitch Heather. 11/09/2022 11:00 Heather Heather Medical Record Number: [id number] Patient Account Number: [id number] Date of Birth/Sex: Treating RN: 1975-12-09 (47 y.o. [name], [name] Primary Care Odyssey Vasbinder: Heather Heather Other Clinician: Massie [name] Referring Kanijah Groseclose: Treating Jurney Overacker/Extender:  Heather Heather in Treatment: 73 Active Inactive Abuse / Safety / Falls / Self Care Management Nursing Diagnoses: History of Falls Potential for falls Potential for injury related to falls Goals: Patient will not develop complications from immobility Date Initiated: 03/17/2022 Date Inactivated: 04/20/2022 Target Resolution Date: 03/16/2022 Goal Status: Met Patient/caregiver will verbalize understanding of skin care regimen Date Initiated: 03/17/2022 Date Inactivated: 09/28/2022 Target Resolution Date: 03/16/2022 Goal Status: Met Patient/caregiver will verbalize/demonstrate measure taken to improve self care Date Initiated: 03/17/2022 Target Resolution Date: 03/16/2022 Goal Status: Active Interventions: Assess fall risk on admission and as needed Provide education on basic hygiene Provide education on personal and home safety Notes: Necrotic Tissue Nursing Diagnoses: Impaired tissue integrity related to necrotic/devitalized tissue Knowledge deficit related to management of necrotic/devitalized tissue Goals: Necrotic/devitalized tissue will be minimized in the wound bed Date Initiated: 03/17/2022 Target Resolution Date: 10/26/2022 Goal Status: Active Patient/caregiver will verbalize understanding of reason and process for debridement of necrotic tissue Date Initiated: 03/17/2022 Date Inactivated: 09/28/2022 Target Resolution Date: 03/16/2022 Goal Status: Met Interventions: Provide education on necrotic tissue and debridement process Treatment Activities: Heather Heather, Heather Heather ([id number]) [id number] Page 6 of 11 Apply topical anesthetic as ordered : 03/16/2022 Excisional debridement : 03/16/2022 Notes: Osteomyelitis Nursing Diagnoses: Infection: osteomyelitis Knowledge deficit related to disease process and management Goals: Patient/caregiver will verbalize understanding of disease process and disease management Date Initiated:  03/17/2022 Target Resolution Date: 03/16/2022 Goal Status: Active Patient's osteomyelitis will resolve Date Initiated: 03/17/2022 Target Resolution Date: 10/26/2022 Goal Status: Active Signs and symptoms for osteomyelitis will be recognized and promptly addressed Date Initiated: 03/17/2022 Target Resolution Date: 10/26/2022 Goal Status: Active Interventions: Assess for signs and symptoms of osteomyelitis resolution every visit Provide education on osteomyelitis Treatment Activities: Systemic antibiotics : 03/16/2022 Notes: Patient on antibiotics. Pressure Nursing Diagnoses: Knowledge deficit related to management of pressures ulcers Potential for impaired tissue integrity related to pressure, friction, moisture, and shear Goals: Patient will remain free from development of additional pressure ulcers Date Initiated: 11/02/2022 Target Resolution Date: 11/09/2022 Goal Status: Active Patient/caregiver will verbalize risk factors for pressure ulcer development Date Initiated: 11/02/2022 Target Resolution Date: 11/02/2022 Goal Status: Active Patient/caregiver will verbalize understanding of pressure ulcer management Date Initiated: 11/02/2022 Target Resolution Date: 11/02/2022 Goal Status: Active Interventions: Assess: immobility, friction, shearing, incontinence upon admission and as needed Provide education on pressure ulcers Notes: Electronic Signature(s) Signed: 11/09/2022 11:43:47 PM By: Gretta Cool, BSN, RN, CWS, [name] RN, BSN Signed: 11/11/2022 9:50:53 AM By: Heather Heather Entered By: Heather Heather on 11/09/2022 12:30:27 -------------------------------------------------------------------------------- Pain Assessment Details Patient Name: Date of Service: Heather Hitch Heather. 11/09/2022 11:00 Heather Heather Heather, Heather Heather ([id number]) [id number] Page 7 of 11 Medical Record Number: [id number] Patient Account Number: [id number] Date of Birth/Sex: Treating  RN: [birth year] (47 y.o.  Heather Heather Primary Care Joseluis Alessio: Heather Heather Other Clinician: Massie [name] Referring Shey Bartmess: Treating Ariahna Smiddy/Extender: Heather Heather in Treatment: 82 Active Problems Location of Pain Severity and Description of Pain Patient Has Paino Yes Site Locations Pain Location: Generalized Pain, Pain in Ulcers Duration of the Pain. Constant / Intermittento Constant Rate the pain. Current Pain Level: 4 Character of Pain Describe the Pain: Aching Pain Management and Medication Current Pain Management: Medication: Yes Cold Application: No Rest: No Massage: No Activity: No T.E.N.S.: No Heat Application: No Leg drop or elevation: No Is the Current Pain Management Adequate: Inadequate How does your wound impact your activities of daily livingo Sleep: No Bathing: No Appetite: No Relationship With Others: No Bladder Continence: No Emotions: No Bowel Continence: No Work: No Toileting: No Drive: No Dressing: No Hobbies: No Engineer, maintenance) Signed: 11/09/2022 11:43:47 PM By: Gretta Cool, BSN, RN, CWS, [name] RN, BSN Signed: 11/11/2022 9:50:53 AM By: Heather Heather Entered By: Heather Heather on 11/09/2022 11:17:58 -------------------------------------------------------------------------------- Patient/Caregiver Education Details Patient Name: Date of Service: Michel Harrow 1/17/2024andnbsp11:00 [place] Record Number: [id number] Patient Account Number: [id number] Date of Birth/Gender: Treating RN: [birth year] (47 y.o. Heather Heather Primary Care Physician: Heather Heather Other Clinician: Massie [name] Referring Physician: Treating Physician/Extender: Heather Heather in Treatment: [address] [place] ([id number]) [id number] Page 8 of 11 Education Assessment Education Provided To: Patient Education Topics Provided Wound/Skin Impairment: Handouts: Other: continue wound care as  directed Methods: Explain/Verbal Responses: State content correctly Electronic Signature(s) Signed: 11/11/2022 9:50:53 AM By: Heather Heather Entered By: Heather Heather on 11/09/2022 12:30:19 -------------------------------------------------------------------------------- Wound Assessment Details Patient Name: Date of Service: Heather Heather, Heather Heather. 11/09/2022 11:00 Heather Heather Medical Record Number: [id number] Patient Account Number: [id number] Date of Birth/Sex: Treating RN: Mar 10, 1976 (47 y.o. Heather Heather Primary Care Elantra Caprara: Heather Heather Other Clinician: Massie [name] Referring Taijah Macrae: Treating Marybella Ethier/Extender: Heather Heather Weeks in Treatment: 34 Wound Status Wound Number: 11 Primary Open Surgical Wound Etiology: Wound Location: Right, Plantar Foot Wound Open Wounding Event: Surgical Injury Status: Date Acquired: 12/01/2021 Comorbid Chronic sinus problems/congestion, Middle ear problems, Anemia, Weeks Of Treatment: 34 History: Chronic Obstructive Pulmonary Disease (COPD), Congestive Heart Clustered Wound: No Failure, Type II Diabetes, End Stage Renal Disease, History of pressure wounds, Neuropathy Photos Wound Measurements Length: (cm) 0. Width: (cm) 0. Depth: (cm) 0. Area: (cm) 0 Volume: (cm) 0 Heather Heather, Heather Heather ([id number]) Wound Description Classification: Full Thickness Without Exposed Suppo Wound Margin: Distinct, outline attached Exudate Amount: Medium Exudate Type: Serosanguineous Exudate Color: red, brown rt Structures Foul Odor After Cleansing: Slough/Fibrino 3 % Reduction in Area: 94.9% 4 % Reduction in Volume: 94.9% 9 Epithelialization: Medium (34-66%) .094 Tunneling: Yes .085 Position (o'clock): 12 Maximum Distance: (cm) 0.9 [id number] Page 9 of 11 No Yes Wound Bed Granulation Amount: Medium (34-66%) Exposed Structure Granulation Quality: Pale Fascia Exposed: No Necrotic Amount: Medium (34-66%) Fat  Layer (Subcutaneous Tissue) Exposed: Yes Tendon Exposed: No Muscle Exposed: No Joint Exposed: No Bone Exposed: No Treatment Notes Wound #11 (Foot) Wound Laterality: Plantar, Right Cleanser Wound Cleanser Discharge Instruction: Wash your hands with soap and water. Remove old dressing, discard into plastic bag and place into trash. Cleanse the wound with Wound Cleanser prior to applying Heather clean dressing using gauze sponges, not tissues or cotton balls. Do not scrub or use excessive force. Pat dry using gauze sponges, not tissue or cotton balls. Peri-Wound Care Topical keystone gel Primary Dressing Aquacel Extra  Hydrofiber Dressing, 2x2 (in/in) Secondary Dressing ABD Pad 5x9 (in/in) Discharge Instruction: Cover with ABD pad Secured With Medipore T - 35M Medipore H Soft Cloth Surgical T ape ape, 2x2 (in/yd) Kerlix Roll Sterile or Non-Sterile 6-ply 4.5x4 (yd/yd) Discharge Instruction: Apply Kerlix as directed Compression Wrap Compression Stockings Add-Ons Electronic Signature(s) Signed: 11/09/2022 11:43:47 PM By: Gretta Cool, BSN, RN, CWS, [name] RN, BSN Signed: 11/11/2022 9:50:53 AM By: Heather Heather Entered By: Heather Heather on 11/09/2022 11:30:47 -------------------------------------------------------------------------------- Wound Assessment Details Patient Name: Date of Service: Heather Hitch Heather. 11/09/2022 11:00 Heather Heather Medical Record Number: [id number] Patient Account Number: [id number] Date of Birth/Sex: Treating RN: 06-14-1976 (47 y.o. Heather Heather Primary Care Camren Lipsett: Heather Heather Other Clinician: Massie [name] Referring Phelix [name]: Treating Zakary Kimura/Extender: Heather Heather in Treatment: [address] [place] ([id number]) [id number] Page 10 of 11 Wound Status Wound Number: 12 Primary Diabetic Wound/Ulcer of the Lower Extremity Etiology: Wound Location: Left, Medial, Plantar Foot Wound Open Wounding Event: Pressure  Injury Status: Date Acquired: 03/16/2020 Comorbid Chronic sinus problems/congestion, Middle ear problems, Anemia, Weeks Of Treatment: 34 History: Chronic Obstructive Pulmonary Disease (COPD), Congestive Heart Clustered Wound: No Failure, Type II Diabetes, End Stage Renal Disease, History of pressure wounds, Neuropathy Photos Wound Measurements Length: (cm) 1.7 Width: (cm) 0.9 Depth: (cm) 0.1 Area: (cm) 1.202 Volume: (cm) 0.12 % Reduction in Area: 74.5% % Reduction in Volume: 87.3% Epithelialization: None Wound Description Classification: Grade 3 Wound Margin: Flat and Intact Exudate Amount: Medium Exudate Type: Serous Exudate Color: amber Foul Odor After Cleansing: No Slough/Fibrino No Wound Bed Granulation Amount: Small (1-33%) Exposed Structure Granulation Quality: Red, Pink Fascia Exposed: No Necrotic Amount: Large (67-100%) Fat Layer (Subcutaneous Tissue) Exposed: Yes Necrotic Quality: Adherent Slough Tendon Exposed: No Muscle Exposed: No Joint Exposed: No Bone Exposed: No Treatment Notes Wound #12 (Foot) Wound Laterality: Plantar, Left, Medial Cleanser Wound Cleanser Discharge Instruction: Wash your hands with soap and water. Remove old dressing, discard into plastic bag and place into trash. Cleanse the wound with Wound Cleanser prior to applying Heather clean dressing using gauze sponges, not tissues or cotton balls. Do not scrub or use excessive force. Pat dry using gauze sponges, not tissue or cotton balls. Peri-Wound Care Triamcinolone Acetonide Cream, 0.1%, 15 (g) tube Discharge Instruction: Apply as directed to irritated areas. Topical Primary Dressing Secondary Dressing ABD Pad 5x9 (in/in) Discharge Instruction: Cover with ABD pad Foam Dressing, 4x4 (in/in) Discharge Instruction: used for padding ankle and forefoot. Secured With Compression Heather Heather, Heather Heather ([id number]) [id number] Page 11 of 11 Compression  Stockings Add-Ons Electronic Signature(s) Signed: 11/09/2022 11:43:47 PM By: Gretta Cool, BSN, RN, CWS, [name] RN, BSN Signed: 11/11/2022 9:50:53 AM By: Heather Heather Entered By: Heather Heather on 11/09/2022 11:31:40 -------------------------------------------------------------------------------- Vitals Details Patient Name: Date of Service: Heather Heather, Fatma Heather. 11/09/2022 11:00 Heather Heather Medical Record Number: [id number] Patient Account Number: [id number] Date of Birth/Sex: Treating RN: 1976-03-03 (47 y.o. [name], [name] Primary Care Cedar Roseman: Heather Heather Other Clinician: Massie [name] Referring Malakie Balis: Treating Nyheim Seufert/Extender: Heather Heather in Treatment: 34 Vital Signs Time Taken: 11:14 Temperature (F): 98.1 Height (in): 69 Pulse (bpm): 60 Weight (lbs): 178 Respiratory Rate (breaths/min): 16 Body Mass Index (BMI): 26.3 Blood Pressure (mmHg): 122/79 Reference Range: 80 - 120 mg / dl Electronic Signature(s) Signed: 11/11/2022 9:50:53 AM By: Heather Heather Entered By: Heather Heather on 11/09/2022 11:17:52

## 2022-11-16 ENCOUNTER — Encounter (HOSPITAL_BASED_OUTPATIENT_CLINIC_OR_DEPARTMENT_OTHER): Payer: Medicaid Other | Admitting: Internal Medicine

## 2022-11-16 DIAGNOSIS — E11621 Type 2 diabetes mellitus with foot ulcer: Secondary | ICD-10-CM

## 2022-11-16 DIAGNOSIS — L97528 Non-pressure chronic ulcer of other part of left foot with other specified severity: Secondary | ICD-10-CM | POA: Diagnosis not present

## 2022-11-16 DIAGNOSIS — L97512 Non-pressure chronic ulcer of other part of right foot with fat layer exposed: Secondary | ICD-10-CM

## 2022-11-17 NOTE — Progress Notes (Signed)
SAHORY, NORDLING [name] ([id number]) [id number] Page 1 of 21 Visit Report for 11/16/2022 Chief Complaint Document Details Patient Name: Date of Service: [name], [name] 11/16/2022 11:00 [name]: [id number] Patient Account Number: [id number] Date of Birth/Sex: Treating RN: 03/05/1976 (47 y.o. [name] Primary Care Provider: Raelene Bott Other Clinician: Massie Kluver Referring Provider: Treating Provider/Extender: Eddie North in Treatment: 46 Information Obtained from: Patient Chief Complaint 03/19/2021; patient referred by Dr. Luana Shu who has been looking after her left foot for quite [name] period of time for review of [name] nonhealing area in the left midfoot 03/12/2022; bilateral feet wounds and right lower extremity wound. Electronic Signature(s) Signed: 11/16/2022 3:12:59 PM By: Kalman Shan DO Entered By: Kalman Shan on 11/16/2022 12:47:48 -------------------------------------------------------------------------------- Cellular or Tissue Based Product Details Patient Name: Date of Service: Heather Hitch [name]. 11/16/2022 11:00 [name]: [id number] Patient Account Number: [id number] Date of Birth/Sex: Treating RN: 08-06-1976 (47 y.o. [name] Primary Care Provider: Raelene Bott Other Clinician: Massie Kluver Referring Provider: Treating Provider/Extender: Eddie North in Treatment: [phone] Cellular or Tissue Based Product Type Wound #12 Left,Medial,Plantar Foot Applied to: Performed By: Physician Kalman Shan, MD Cellular or Tissue Based Product Type: Apligraf Level of Consciousness (Pre-procedure): Awake and Alert Pre-procedure Verification/Time Out Yes - 11:42 Taken: Location: genitalia / hands / feet / multiple digits Wound Size (sq cm): 1.82 Product Size (sq cm): 44 Waste Size (sq cm): 42 Waste Reason: wound size Amount of Product Applied (sq cm):  2 Instrument Used: Blade, Scissors Lot #: [id number] Order #: [id number] Expiration Date: 11/22/2022 [name], [name] ([id number]) [id number] Page 2 of 21 Fenestrated: Yes Instrument: Blade Reconstituted: Yes Solution Type: normal saline Solution Amount: 19mls Lot #: [id number] Solution Expiration Date: 11/17/2023 Secured: Yes Secured With: Steri-Strips Dressing Applied: Yes Primary Dressing: mepitel one Response to Treatment: Procedure was tolerated well Level of Consciousness (Post- Awake and Alert procedure): Post Procedure Diagnosis Same as Pre-procedure Electronic Signature(s) Signed: 11/16/2022 5:30:54 PM By: Massie Kluver Entered By: Massie Kluver on 11/16/2022 12:17:48 -------------------------------------------------------------------------------- Debridement Details Patient Name: Date of Service: Heather Hitch [name]. 11/16/2022 11:00 [name]: [id number] Patient Account Number: [id number] Date of Birth/Sex: Treating RN: 31-Mar-1976 (47 y.o. [name] Primary Care Provider: Raelene Bott Other Clinician: Massie Kluver Referring Provider: Treating Provider/Extender: Eddie North in Treatment: 35 Debridement Performed for Assessment: Wound #12 Left,Medial,Plantar Foot Performed By: Physician Kalman Shan, MD Debridement Type: Debridement Severity of Tissue Pre Debridement: Fat layer exposed Level of Consciousness (Pre-procedure): Awake and Alert Pre-procedure Verification/Time Out Yes - 11:38 Taken: Start Time: 11:38 T Area Debrided (L x W): otal 2 (cm) x 2 (cm) = 4 (cm) Tissue and other material debrided: Viable, Non-Viable, Slough, Subcutaneous, Slough Level: Skin/Subcutaneous Tissue Debridement Description: Excisional Instrument: Curette Bleeding: Minimum Hemostasis Achieved: Pressure Response to Treatment: Procedure was tolerated well Level of Consciousness (Post- Awake and  Alert procedure): Post Debridement Measurements of Total Wound Length: (cm) 1.4 Width: (cm) 1.3 Depth: (cm) 0.1 Volume: (cm) 0.143 Character of Wound/Ulcer Post Debridement: Stable Severity of Tissue Post Debridement: Fat layer exposed Post Procedure Diagnosis Same as Pre-procedure [name], [name] ([id number]) [id number] Page 3 of 21 Electronic Signature(s) Signed: 11/16/2022 3:12:59 PM By: Kalman Shan DO Signed: 11/16/2022 5:30:54 PM By: Massie Kluver Signed: 11/16/2022 5:47:34 PM By: Gretta Cool, BSN, RN, CWS, [name] RN, BSN Entered By: Massie Kluver on 11/16/2022 11:39:04 -------------------------------------------------------------------------------- Debridement Details Patient Name: Date of  Service: [name], [name] 11/16/2022 11:00 [name]: [id number] Patient Account Number: [id number] Date of Birth/Sex: Treating RN: [birth year] (47 y.o. [name] Primary Care Provider: Raelene Bott Other Clinician: Massie Kluver Referring Provider: Treating Provider/Extender: Eddie North in Treatment: 35 Debridement Performed for Assessment: Wound #11 Right,Plantar Foot Performed By: Physician Kalman Shan, MD Debridement Type: Debridement Level of Consciousness (Pre-procedure): Awake and Alert Pre-procedure Verification/Time Out Yes - 11:40 Taken: Start Time: 11:40 T Area Debrided (L x W): otal 1 (cm) x 1 (cm) = 1 (cm) Tissue and other material debrided: Viable, Non-Viable, Callus, Slough, Subcutaneous, Slough Level: Skin/Subcutaneous Tissue Debridement Description: Excisional Instrument: Curette Bleeding: Minimum Hemostasis Achieved: Pressure Response to Treatment: Procedure was tolerated well Level of Consciousness (Post- Awake and Alert procedure): Post Debridement Measurements of Total Wound Length: (cm) 0.5 Width: (cm) 0.7 Depth: (cm) 0.7 Volume: (cm) 0.192 Character of Wound/Ulcer Post  Debridement: Stable Post Procedure Diagnosis Same as Pre-procedure Electronic Signature(s) Signed: 11/16/2022 3:12:59 PM By: Kalman Shan DO Signed: 11/16/2022 5:30:54 PM By: Massie Kluver Signed: 11/16/2022 5:47:34 PM By: Gretta Cool, BSN, RN, CWS, [name] RN, BSN Entered By: Massie Kluver on 11/16/2022 11:40:42 [name] ([id number]) [id number] Page 4 of 21 -------------------------------------------------------------------------------- HPI Details Patient Name: Date of Service: [name], [name] 11/16/2022 11:00 [name]: [id number] Patient Account Number: [id number] Date of Birth/Sex: Treating RN: 12-23-1975 (47 y.o. [name] Primary Care Provider: Raelene Bott Other Clinician: Massie Kluver Referring Provider: Treating Provider/Extender: Eddie North in Treatment: 35 History of Present Illness HPI Description: 01/18/18-She is here for initial evaluation of the left great toe ulcer. She is [name] poor historian in regards to timeframe in detail. She states approximately 4 weeks ago she lacerated her toe on something in the house. She followed up with her primary care who placed her on Bactrim and ultimately [name] second dose of Bactrim prior to coming to wound clinic. She states she has been treating the toe with peroxide, Betadine and [name] Band-Aid. She did not check her blood sugar this morning but checked it yesterday morning it was 327; she is unaware of [name] recent A1c and there are no current records. She saw Dr. she would've orthopedics last week for an old injury to the left ankle, she states he did not see her toe, nor did she bring it to his attention. She smokes approximately 1 pack cigarettes [name] day. Her social situation is concerning, she arrives this morning with her mother who appears extremely intoxicated/under the influence; her mother was asked to leave the room and be monitored by the patient's grandmother.  The patient's aunt then accompanied the patient and the room throughout the rest of the appointment. We had [name] lengthy discussion regarding the deleterious effects of uncontrolled hyperglycemia and smoking as it relates to wound healing and overall health. She was strongly encouraged to decrease her smoking and get her diabetes under better control. She states she is currently on [name] diet and has cut down her [facility] consumption. The left toe is erythematous, macerated and slightly edematous with malodor present. The edema in her left foot is below her baseline, there is no erythema streaking. We will treat her with Santyl, doxycycline; we have ordered and xray, culture and provided [name] Peg assist surgical shoe and cultured the wound. 01/25/18-She is here in follow-up evaluation for [name] left great toe ulcer and presents with an abscess to her suprapubic area. She states  her blood sugars remain elevated, feeling "sick" and if levels are below 250, but she is trying. She has made no attempt to decrease her smoking stating that we "can't take away her food in her cigarettes". She has been compliant with offloading using the PEG assist you. She is using Santyl daily. the culture obtained last week grew staph aureus and Enterococcus faecalis; continues on the doxycycline and Augmentin was added on Monday. The suprapubic area has erythema, no femoral variation, purple discoloration, minimal induration, was accessed with [name] cotton tip applicator with sanguinopurulent drainage, this was cultured, I suspect the current antibiotic treatment will cover and we will not add anything to her current treatment plan. She was advised to go to urgent care or ER with any change in redness, induration or fever. 02/01/18-She is here in follow-up evaluation for left great toe ulcers and [name] new abdominal abscess from last week. She was able to use packing until earlier this week, where she "forgot it was there". She states she was  feeling ill with GI symptoms last week and was not taking her antibiotic. She states her glucose levels have been predominantly less than 200, with occasional levels between 200-250. She thinks this was contributing to her GI symptoms as they have resolved without intervention. There continues to be significant laceration to left toe, otherwise it clinically looks stable/improved. There is now less superficial opening to the lateral aspect of the great toe that was residual blister. We will transition to [facility] to all wounds, she will continue her Augmentin. If there is no change or deterioration next week for reculture. 02/08/18-She is here in follow-up evaluation for left great toe ulcer and abdominal ulcer. There is an improvement in both wounds. She has been wrapping her left toe with coban, not by our direction, which has created an area of discoloration to the medial aspect; she has been advised to NOT use coban secondary to her neuropathy. She states her glucose levels have been high over this last week ranging from 200-350, she continues to smoke. She admits to being less compliant with her offloading shoe. We will continue with same treatment plan and she will follow-up next week. 02/15/18-She is here in follow-up evaluation for left great toe ulcer and abdominal ulcer. The abdominal ulcer is epithelialized. The left great toe ulcer is improved and all injury from last week using the Coban wrap is resolved, the lateral ulcer is healed. She admits to noncompliance with wearing offloading shoe and admits to glucose levels being greater than 300 most of the week. She continues to smoke and expresses no desire to quit. There is one area medially that probes deeper than it has historically, erythema to the toe and dorsal foot has consistently waxed and waned. There is no overt signs of cellulitis or infection but we will culture the wound for any occult infection given the new area of depth  and erythema. We will hold off on sensitivities for initiation of antibiotic therapy. 02/22/18-She is here in follow up evaluation for left great toe ulcer. There is overall significant improvement in both wound appearance, erythema and edema with changes made last week. She was not initiated on antibiotic therapy. Culture obtained last week showed oxacillin sensitive staph aureus, sensitive to clindamycin. Clindamycin has been called into the pharmacy but she has been instructed to hold off on initiation secondary to overall clinical improvement and her history of antibiotic intolerance. She has been instructed to contact the clinic with any noted  changes/deterioration and the wound, erythema, edema and/or pain. She will follow-up next week. She continues to smoke and her glucose levels remain elevated >250; she admits to compliance with offloading shoe 03/01/18 on evaluation today patient appears to be doing fairly well in regard to her left first toe ulcer. She has been tolerating the dressing changes with the [facility] Dressing without complication and overall this has definitely showed signs of improvement according to records as well is what the patient tells me today. I'm very pleased in that regard. She is having no pain today 03/08/18 She is here for follow up evaluation of [name] left great toe ulcer. She remains non-compliant with glucose control and smoking cessation; glucose levels consistently >200. She states that she got new shoe inserts/peg assist. She admits to compliance with offloading. Since my last evaluation there is significant improvement. We will switch to prisma at this time and she will follow up next week. She is noted to be tachycardic at this appointment, heart rate 120s; she has [name] history of heart rate 70-130 according to our records. She admits to extreme agitation r/t personal issues; she was advised to monitor her heartrate and contact her physician if it does not return  to [name] more normal range (<100). She takes cardizem twice daily. 03/15/18-She is here in follow-up evaluation for left great toe ulcer. She remains noncompliant with glucose control and smoking cessation. She admits to compliance with wearing offloading shoe. The ulcer is improved/stable and we will continue with the same treatment plan and she will follow-up next week 03/22/18-She is here for evaluation for left great toe ulcer. There continues to be significant improvement despite recurrent hyperglycemia (over 500 yesterday) and she continues to smoke. She has been compliant with offloading and we will continue with same treatment plan and she will follow-up next week. 03/29/18-She is here for evaluation for left great toe ulcer. Despite continuing to smoke and uncontrolled diabetes she continues to improve. She is compliant with offloading shoe. We will continue with the same treatment plan and she will follow-up next week 04/05/18- She is here in follow up evaluation for [name] left great toe ulcer; she presents with small pustule to left fifth toe (resembles ant bite). She admits to compliance with wearing offloading shoe; continues to smoke or have uncontrolled blood glucose control. There is more callus than usual with evidence of bleeding; she denies known trauma. 04/12/18-She is here for evaluation of left great toe ulcer. Despite noncompliance with glycemic control and smoking she continues to make improvement. She continues to wear offloading shoe. The pustule, that was identified last week, to the left fifth toe is resolved. She will follow-up in 2 weeks 05/03/18-she is seen in follow-up evaluation for [name] left great toe ulcer. She is compliant with offloading, otherwise noncompliant with glycemic control and smoking. She has plateaued and there is minimal improvement noted. We will transition to [facility], replaced the insert to her surgical shoe and she will follow-up in one week 05/10/18- She is  here in follow up evaluation for [name] left great toe ulcer. It appears stable despite measurement change. We will continue with same treatment plan and follow up next week. 05/24/18-She is seen in follow-up evaluation for [name] left great toe ulcer. She remains compliant with offloading, has made significant improvement in her diet, decreasing the amount of sugar/soda. She said her recent A1c was 10.9 which is lower than. She did see [name] diabetic nutritionist/educator yesterday. She continues to smoke. We  will continue with the same treatment plan and she'll follow-up next week. 05/31/18- She is seen in follow-up evaluation for left great toe ulcer. She continues to remain compliant with offloading, continues to make improvement in her diet, increasing her water and decreasing the amount of sugar/soda. She does continue to smoke with no desire to quit. We will apply Prisma to the depth and Hydrofera Blue over. We have not received insurance authorization for oasis. She will follow up next week. 06/07/18-She is seen in follow-up evaluation for left great toe ulcer. It has stalled according to today's measurements although base appears stable. She says she saw [name] diabetic educator yesterday; her average blood sugars are less than 300 which is an improvement for her. She continues to smoke and states "that's my next step" She continues with water over soda. We will order for xray, culture and reinstate ace wrap compression prior to placing apligraf for next week. She is voicing no complaints or concerns. Her dressing will change to iodoflex over the next week in preparation for apligraf. 06/14/18-She is seen in follow-up evaluation for left great toe ulcer. Plain film x-ray performed last week was negative for osteomyelitis. Wound culture obtained last week grew strep B and OSSA; she is initiated on keflex and cefdinir today; there is erythema to the toe which could be from ace wrap compression, she Auguste, Judye [name]  ([id number]) [id number] Page 5 of 21 has [name] history of wrapping too tight and has has been encouraged to maintain ace wraps that we place today. We will hold off on application of apligraf today, will apply next week after antibiotic therapy has been initiated. She admits today that she has resumed taking [name] shower with her foot/toe submerged in water, she has been reminded to keep foot/toe out of the bath water. She will be seen in follow up next week 06/21/18-she is seen in follow-up evaluation for left great toe ulcer. She is tolerating antibiotic therapy with no GI disturbance. The wound is stable. Apligraf was applied today. She has been decreasing her smoking, only had 4 cigarettes yesterday and 1 today. She continues being more compliant in diabetic diet. She will follow-up next week for evaluation of site, if stable will remove at 2 weeks. 06/28/18- She is here in follow up evalution. Apligraf was placed last week, she states the dressing fell off on Tuesday and she was dressing with hydrofera blue. She is healed and will be discharged from the clinic today. She has been instructed to continue with smoking cessation, continue monitoring glucose levels, offloading for an additional 4 weeks and continue with hydrofera blue for additional two weeks for any possible microscopic opening. Readmission: 08/07/18 on evaluation today patient presents for reevaluation concerning the ulcer of her right great toe. She was previously discharged on 06/28/18 healed. Nonetheless she states that this began to show signs of drainage she subsequently went to her primary care provider. Subsequently an x-ray was performed on 08/01/18 which was negative. The patient was also placed on antibiotics at that time. Fortunately they should have been effective for the infection. Nonetheless she's been experiencing some improvement but still has [name] lot of drainage coming from the wound itself. 08/14/18 on  evaluation today patient's wound actually does show signs of improvement in regard to the erythema at this point. She has completed the antibiotics. With that being said we did discuss the possibility of placing her in [name] total contact cast as of today although I think that I may  want to give this just [name] little bit more time to ensure nothing recurrence as far as her infection is concerned. I do not want to put in the cast and risk infection at that time if things are not completely resolved. With that being said she is gonna require some debridement today. 08/21/18 on evaluation today patient actually appears to be doing okay in regard to her toe ulcer. She's been tolerating the dressing changes without complication. With that being said it does appear that she is ready and in fact I think it's appropriate for [place] to go ahead and initiate the total contact cast today. Nonetheless she will require some sharp debridement to prepare the wound for application. Overall I feel like things have been progressing well but we do need to do something to get this to close more readily. 08/24/18 patient seen today for reevaluation after having had the total contact cast applied on Tuesday. She seems to have done very well the wound appears to be doing great and overall I'm pleased with the progress that she's made. There were no abnormal areas of rubbing from the cast on her lower extremity. 08/30/18 on evaluation today patient actually appears to be completely healed in regard to her plantar toe ulcer. She tells me at this point she's been having [name] lot of issues with the cast. She almost fell [name] couple of times the state shall the step of her dog [name] couple times as well. This is been [name] very frustrating process for her other nonetheless she has completely healed the wound which is excellent news. Overall there does not appear to be the evidence of infection at this time which is great news. 09/11/18 evaluation today  patient presents for follow-up concerning her great toe ulcer on the left which has unfortunately reopened since I last saw her which was only [name] couple of weeks ago. Unfortunately she was not able to get in to get the shoe and potentially the AFO that's gonna be necessary due to her left foot drop. She continues with offloading shoe but this is not enough to prevent her from reopening it appears. When we last had her in the total contact cast she did well from [name] healing standpoint but unfortunately the wound reopened as soon as she came out of the cast within just [name] couple of weeks. 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 This is also part of the reason why she falls on [name] regular basis. I just do not believe that is gonna be safe for [place] to reinitiate the total contact cast as last time we had this on she fell 3 times one week which is definitely not normal for her. 09/18/18 upon evaluation today the patient actually appears to be doing about the same in regard to her toe ulcer. She did not contact [employer] as I asked her to even though I had given her the prescription. In fact she actually states that she has no idea where the prescription is. She did apparently call [employer] and they told her that all she needed to do was bring the prescription in order to be able to be seen and work on getting the AFO for her left foot. With all that being said she still does not  have an appointment and I'm not sure were things stand that regard. I will give her [name] new prescription today in order to contact them to get this set up. 09/25/18 on evaluation today patient actually appears to be doing about the same in regard to her toes ulcer. She does have [name] small areas which seems to have [name] lot of callous buildup around the  edge of the wound which is going to need sharp debridement today. She still is waiting to be scheduled for evaluation with [employer] for possibility of an AFO. She states there supposed to call her tomorrow to get this set up. Unfortunately it does appear that her foot specifically the toe area is showing signs of erythema. There does not appear to be any systemic infection which is in these good news. 10/02/18 on evaluation today patient actually appears to be doing about the same in regard to her toe ulcer. This really has not done too well although it's not significantly larger it's also not significantly smaller. She has been tolerating the dressing changes without complication. She actually has her appointment with [facility] tomorrow to hopefully be measured for obtaining and AFO splint. I think this would be helpful preventing this from reoccurring. We had contemplated starting the cast this week although to be honest I am reluctant to do that as she's been having nausea, vomiting, and seizure activity over the past three days. She has [name] history of seizures and have been told is nothing that can be done for these. With that being said I do believe that along with the seizures have the nausea vomiting which upon further questioning doesn't seem to be the normal for her and makes me concerned for the possibility of infection or something else going on. I discussed this with the patient and her mother during the office visit today. I do not feel the wound is effective but maybe something else. The responses this was "this just happens to her at times and we don't know why". They did not seem to be interested in going to the hospital to have this checked out further. 10/09/18 on evaluation today patient presents for follow-up concerning her ongoing toe ulcer. She has been tolerating the dressing changes without complication. Fortunately there does not appear to be any evidence of infection  which is great news however I do think that the patient would benefit from going ahead for with the total contact cast. She's actually in [name] wheelchair today she tells me that she will use her walker if we initiate the cast. I was very specific about the fact that if we were gonna do the cast I wanted to make sure that she was using the walker in order to prevent any falls. She tells me she does not have stairs that she has to traverse on [name] regular basis at her home. She has not had any seizures since last week again that something that happens to her often she tells me she did talk to [employer] and they said that it may take up to three weeks to get the brace approved for her. Hopefully that will not take that long but nonetheless in the meantime I do think the cast could be of benefit. 10/12/18 on evaluation today patient appears to be doing rather well in regard to her toe ulcer. It's just been [name] few days and already this is significantly improved both as far as overall appearance and size. Fortunately there's no sign of infection.  She is here for her first obligatory cast change. 10/19/18 Seen today for follow up and management of left great toe ulcer. Wound continues to show improvement. Noted small open area with seroussang drainage with palpation. Denies any increased pain or recent fevers during visit. She will continue calcium alginate with offloading shoe. Denies any questions or concerns during visit. 10/26/18 on evaluation today patient appears to be doing about the same as when I last saw her in regard to her wound bed. Fortunately there does not appear to be any signs of infection. Unfortunately she continues to have [name] breakdown in regard to the toe region any time that she is not in the cast. It takes almost no time at all for this to happen. Nonetheless she still has not heard anything from the brace being made by [employer] as to when exactly this will be available to her. Fortunately there is  no signs of infection at this time. 10/30/18 on evaluation today patient presents for application of the total contact cast as we just received him this morning. Fortunately we are gonna be able to apply this to her today which is great news. She continues to have no significant pain which is good news. Overall I do feel like things have been improving while she was the cast is when she doesn't have [name] cast that things get worse. She still has not really heard anything from [facility] regarding her brace. 11/02/18 upon evaluation today patient's wound already appears to be doing significantly better which is good news. Fortunately there does not appear to be any signs of infection also good news. Overall I do think the total contact cast as before is helping to heal this area unfortunately it's just not gonna likely keep the area closed and healed without her getting her brace at least. Again the foot drop is [name] significant issue for her. [name], [name] ([id number]) [id number] Page 6 of 21 11/09/18 on evaluation today patient appears to be doing excellent in regard to her toe ulcer which in fact is completely healed. Fortunately we finally got the situation squared away with the paperwork which was needed to proceed with getting her brace approved by Medicaid. I have filled that out unfortunately that information has been sent to the orthopedic office that I worked at 2 1/2 years ago and not tired Current wound care measures. Fortunately she seems to be doing very well at this time. 11/23/18 on evaluation today patient appears to be doing More Poorly Compared to Last Time I Saw Her. At [facility] She Had Completely Healed. Currently she is continuing to have issues with reopening. She states that she just found out that the brace was approved through Medicaid now she just has to go get measured in order to have this fitted for her and then made. Subsequently she does not have an  appointment for this yet that is going to complicate things we obviously cannot put her back in the cast if we do not have everything measured because they're not gonna be able to measure her foot while she is in the cast. 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 Obviously this is unfortunate and does have me somewhat worried as well. 11/30/18 on evaluation today patient's toe ulcer actually appears to be doing fairly well. The good news is she will be getting her brace in the shoes  next week on Wednesday. Hopefully we will be able to get this to heal without having to go back in the cast however she may need the cast in order to get the wound completely heal and then go from there. Fortunately there's no signs of infection at this time. 12/07/18 on evaluation today patient fortunately did receive her brace and she states she could tell this definitely makes her walk better. With that being said she's been having issues with her toe where she noticed yesterday there was [name] lot of tissue that was loosing off this appears to be much larger than what it was previous. She also states that her leg has been read putting much across the top of her foot just about the ankle although this seems to be receiving somewhat. The total area is still red and appears to be someone infected as best I can tell. She is previously taken Bactrim and that may be [name] good option for her today as well. We are gonna see what I wound culture shows as well and I think that this is definitely appropriate. With that being said outside of the culture I still need to initiate something in the interim and that's what I'm gonna go ahead and select Bactrim is [name] good option for her. 12/14/18 on evaluation today patient appears to be doing better in regard to her left great toe ulcer as compared to last week's evaluation. There's still  some erythema although this is significantly improved which is excellent news. Overall I do believe that she is making good progress is still gonna take some time before she is where I would like her to be from the standpoint of being able to place her back into the total contact cast. Hopefully we will be where we need to be by next week. 12/21/18 on evaluation today patient actually appears to be doing poorly in regard to her toe ulcer. She's been tolerating the dressing changes without complication. Fortunately there's no signs of systemic infection although she does have [name] lot of drainage from the toe ulcer and this does seem to be causing some issues at this point. She does have erythema on the distal portion of her toe that appears to be likely cellulitis. 12/28/18 on evaluation today patient actually appears to be doing [name] little better in my pinion in regard to her toe ulcer. With that being said she still does have some evidence of infection at this time and for her culture she had both E. coli as well as enterococcus as organisms noted on evaluation. For that reason I think that though the Keflex likely has treated the E. coli rather well this has really done nothing for the enterococcus. We are going to have to initiate treatment for this specifically. 01/04/19 on evaluation today patient's toe actually appears to be doing better from the standpoint of infection. She currently would like to see about putting the cash back on I think that this is appropriate as long as she takes care of it and keeps it from getting wet. She is gonna have some drainage we can definitely pass this up with Drawtex and alginate to try to prevent as much drainage as possible from causing the problems. With that being said I do want to at least try her with the cast between now and Tuesday. If there any issues we can't continue to use it then I will discontinue the use of the cast at that point. 01/08/19 on evaluation  today patient actually appears to be doing very well as far as her foot ulcer specifically the great toe on the left is concerned. She did have an area of rubbing on the medial aspect of her left ankle which again is from the cast. Fortunately there's no signs of infection at this point in this appears to be [name] very slight skin breakdown. The patient tells me she felt it rubbing but didn't think it was that bad. Fortunately there is no signs of active infection at this time which is good news. No fevers, chills, nausea, or vomiting noted at this time. 01/15/19 on evaluation today patient actually appears to be doing well in regard to her toe ulcer. Again as previous she seems to do well and she has the cast on which indicates to me that during the time she doesn't have [name] cast on she's putting way too much pressure on this region. Obviously I think that's gonna be an issue as with the current national emergency concerning the Covid-19 Virus it has been recommended that we discontinue the use of total contact casting by the chief medical officer of our company, Dr. Simona Huh. The reasoning is that if [name] patient becomes sick and cannot come into have the cast removed they could not just leave this on for an additional two weeks. Obviously the hospitals also do not want to receive patient's who are sick into the emergency department to potentially contaminate the region and spread the Covid-19 Virus among other sick individuals within the hospital system. Therefore at this point we are suspending the use of total contact cast until the current emergency subsides. This was all discussed with the patient today as well. 01/22/19 on evaluation today patient's wound on her left great toe appears to be doing slightly worse than previously noted last week. She tells me that she has been on this quite [name] bit in fact she tells me she's been awake for 38 straight hours. This is due to the fact that she's having to care for  grandparents because nobody else will. She has been taking care of them for five the last seven days since I've seen her they both have dementia his is from [name] stroke and her grandmother's was progressive. Nonetheless she states even her mom who knows her condition and situation has only help two of those days to take care of them she's been taking care of the rest. Fortunately there does not appear to be any signs of active infection in regard to her toe at this point although obviously it doesn't look as good as it did previous. I think this is directly related to her not taking off the pressure and friction by way of taking things easy. Though I completely understand what's going on. 01/29/19 on evaluation today patient's tools are actually appears to be showing some signs of improvement today compared to last week's evaluation as far as not necessarily the overall size of the wound but the fact that she has some new skin growth in between the two ends of the wound opening. Overall I feel like she has done well she states that she had [name] family member give her what sounds to be [name] CAM walker boot which has been helpful as well. 02/05/19 on evaluation today patient's wound bed actually appears to be doing significantly better in regard to her overall appearance of the size of the wound. With that being said she is still having an issue with offloading efficiently enough  to get this to close. Apparently there is some signs of infection at this point as well unfortunately. Previously she's done well of Augmentin I really do not see anything that needs to be culture currently but there theme and cellulitis of the foot that I'm seeing I'm gonna go ahead and place her on an antibiotic today to try to help clear this up. 02/12/2019 on evaluation today patient actually appears to be doing poorly in regard to her overall wound status. She tells me she has been using her offloading shoe but actually comes in today  wearing her tennis shoe with the AFO brace. Again as I previously discussed with her this is really not sufficient to allow the area to heal appropriately. Nonetheless she continues to be somewhat noncompliant and I do wonder based on what she has told my nurse in the past as to whether or not [name] good portion of this noncompliance may be recreational drug and alcohol related. She has had [name] history of heroin overdose and this was fairly recently in the past couple of months that have been seeing her. Nonetheless overall I feel like her wound looks significantly worse today compared to what it was previous. She still has significant erythema despite the Augmentin I am not sure that this is an appropriate medication for her infection I am also concerned that the infection may have gone down into her bone. 02/19/19 on evaluation today patient actually appears to be doing about the same in regard to her toe ulcer. Unfortunately she continues to show signs of bone exposure and infection at this point. There does not appear to be any evidence of worsening of the infection but I'm also not really sure that it's getting significantly better. She is on the Augmentin which should be sufficient for the Staphylococcus aureus infection that she has at this point. With that being said she may need IV antibiotics to more appropriately treat this. We did have [name] discussion today about hyperbaric option therapy. 02/28/19 on evaluation today patient actually appears to be doing much worse in regard to the wound on her left great toe as compared to even my previous evaluation last week. Unfortunately this seems to be training in [name] pretty poor direction. Her toe was actually now starting to angle laterally and I can actually see the entire joint area of the proximal portion of the digit where is the distal portion of the digit again is no longer even in contact with the joint line. Unfortunately there's [name] lot more necrotic  tissue around the edge and the toe appears to be showing signs of becoming gangrenous in my pinion. I'm very concerned about were things stand at this point. She did see infectious disease and they are planning to send in [name] prescription for Sivextro for her and apparently this has been approved. With that being said I don't think she should avoid taking this but at the same time I'm not sure that it's gonna be sufficient [name], [name] ([id number]) [id number] Page 7 of 21 to save her toe at this point. She tells me that she still having to care for grandparents which I think is putting quite [name] bit of strain on her foot and specifically the total area and has caused this to break down even to [name] greater degree than would've otherwise been expected. 03/05/19 on evaluation today patient actually appears to be doing quite well in regard to her toe all things considering. She still has bone exposed  but there appears to be much less your thing on overall the appearance of the wound and the toe itself is dramatically improved. She still does have some issues currently obviously with infection she did see vascular as well and there concerned that her blood flow to the toad. For that reason they are setting up for an angiogram next week. 03/14/19 on evaluation today patient appears to be doing very poor in regard to her toe and specifically in regard to the ulceration and the fact that she's starting to notice the toe was leaning even more towards the lateral aspect and the complete joint is visible on the proximal aspect of the joint. Nonetheless she's also noted [name] significant odor and the tip of the toe is turning more dark and necrotic appearing. Overall I think she is getting worse not better as far as this is concerned. For that reason I am recommending at this point that she likely needs to be seen for likely amputation. READMISSION 03/19/2021 This is [name] patient that we cared  for in this clinic for [name] prolonged period of time in 2019 and 2020 with [name] left foot and left first toe wound. 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 Luana Shu. In December 2021 she had an ulcer on her right great toe as well as the fourth and fifth toes. 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 She also had an angiogram at that time and underwent angioplasty of the right anterior tibial artery. In any case she claims that the wound on the right foot is closed I did not look at this today which was probably an oversight although I think that should be done next week. 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 According to Dr. Deborra Medina last review that she was out of the area being cared for by another physician but recently came back to his attention. 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 [name] culture of this area showed E. coli apparently before she came back to see Dr. Luana Shu she was supposed to be receiving antibiotics but she did not really take them. Nor is she offloading this area at all. Finally her last hemoglobin A1c listed in epic was in March 2022 at 14.1 she says things are [name] lot better since then although I am not sure. 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 She had this wound at the time. During this admission she had E. coli sepsis [name] TEE was negative. 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 She is still [name] heavy cigarette smoker. She has  [name] history of bipolar disorder seizure disorder ABI in our clinic was 1.05 6/1; left midfoot in the setting of [name] TMA done previously. Round circular wound with [name] "knuckle" of protruding tissue. The problem is that the knuckle was not attached to any of the surrounding granulation and this probed proximally widely I removed [name] large portion of this tissue. This wound goes with considerable undermining laterally. I do not feel any bone there  was no purulence but this is [name] deep wound. 6/8; in spite of the debridement I did last week. She arrives with [name] wound looking exactly the same. [name] protruding "knuckle" of tissue nonadherent to most of the surrounding tissue. There is considerable depth around this from 6-12 o'clock at 2.7 cm and undermining of 1 cm. This does not look overtly infected and the x- ray I did last week was negative for any osseous abnormalities. We have been using silver collagen 6/15; deep tissue culture I did last week showed moderate staph aureus and moderate Pseudomonas. This will definitely require prolonged antibiotic therapy. The pathology on the protuberant area was negative for malignancy fungus etc. 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 We have been using silver collagen. I am going to be prescribing Levaquin for 2 weeks. Her CT scan of the foot is down for 7/5 6/22; CT scan of the foot on 7 5. She says she has hardware in the left leg from her previous fracture. She is on the Levaquin for the deep tissue culture I did that showed methicillin sensitive staph aureus and Pseudomonas. I gave her [name] 2-week supply and she will have another week. She arrives in clinic today with the same protuberant tissue however this is nonadherent to the tissue surrounding it. I am really at [name] loss to explain this unless there is underlying deep tissue infection 6/29; patient presents for 1 week follow-up. She has been using collagen to  the wound bed. She reports taking her antibiotics as prescribed.She has no complaints or issues today. She denies signs of infection. 7/6; patient presents for one week followup. She has been using collagen to the wound bed. She states she is taking Levaquin however at times she is not able to keep it down. She denies signs of infection. 7/13; patient presents for 1 week follow-up. She has been using silver alginate to the wound bed. She still has nausea when taking her antibiotics. She denies signs of infection. 7/20; patient presents for 1 week follow-up. She has been using silver alginate with gentamicin cream to the wound bed. She denies any issues and has no complaints today. She denies signs of infection. 7/27; patient presents for 1 week follow-up. She continues to use silver alginate with gentamicin cream to the wound bed. She reports starting her antibiotics. She has no issues or complaints. Overall she reports stability to the wound. 8/3; patient presents for 1 week follow-up. She has been using silver alginate with gentamicin cream to the wound bed. She reports completing all antibiotics. She has no issues or complaints today. She denies signs of infection. 8/17; patient presents for 2-week follow-up. He is to use silver alginate to the wound bed. She has no issues or complaints today. She denies signs of infection. She reports her pain has improved in her foot since last clinic visit 8/24; patient presents for 1 week follow-up. She continues to use silver alginate to the wound bed. She has no issues or complaints. She denies signs of infection. Pain is stable. 9/7; patient presents for follow-up. She missed her last week appointment due to feeling ill. She continues to use silver alginate. She has [name] new wound to the right lower extremity that is covered in eschar. She states It occurred over the past week and has no idea how it started. She currently denies signs of infection. 9/14;  patient presents for follow-up. T the left foot wound she has  been using gentamicin cream and silver alginate. T the right lower extremity wound she has o o been keeping this covered and has not obtain Santyl. 9/21; patient presents for follow-up. She reports using gentamicin cream and silver alginate to the left foot and Santyl to the right lower extremity wound. She has no issues or complaints today. She denies signs of infection. 9/28; patient presents for follow-up. She reports [name] new wound to her right heel. She states this occurred [name] few days ago and is progressively gotten worse. She has been trying to clean the area with [name] Q-tip and Santyl. She reports stability in the other 2 wounds. She has been using gentamicin cream and silver alginate to the left foot and Santyl to the right lower extremity wound. 10/12; patient presents for follow-up. She reports improvement to the wound beds. She is seeing vein and vascular to discuss the potential of [name] left [facility]KA. She states they are going to do an arteriogram. She continues to use silver alginate with dressing changes to her wounds. 11/2; patient presents for follow-up. She states she has not been doing dressing changes to the wound beds. She states she is not able to offload the areas. She reports chronic pain to her left foot wound. 11/9; patient presents for follow-up. She came in with only socks on. She states she forgot to put on shoes. It is unclear if she is doing any dressing changes. She currently denies systemic signs of infection. 11/16; patient presents for follow-up. She came again only with socks on. She states she does not wear shoes ever. It is unclear if she does dressing changes. She currently denies systemic signs of infection. 11/23; patient presents for follow-up. She wore her shoes today. It still unclear exactly what dressing she is using for each wound but she did states she obtained Dakin's solution and has been using this to  the left foot wound. She currently denies signs of infection. 11/30; patient presents for follow-up. She has no issues or complaints today. She currently denies signs of infection. [name], BORSKI [name] ([id number]) [id number] Page 8 of 21 12/7; patient presents for follow-up. She has no issues or complaints today. She has been using Hydrofera Blue to the right heel wound and Dakin solution to the left foot wound. Her right anterior leg wound is healed. She currently denies signs of infection. 12/14; patient presents for follow-up. She has been using Hydrofera Blue to the right heel and Dakin's to the left foot wounds. She has no issues or complaints today. She denies signs of infection. 12/21; patient presents for follow-up. She reports using Hydrofera Blue to the right heel and Dakin's to the left foot wound. She denies signs of infection. 12/28; patient presents for follow-up. She continues to use Dakin's to the left foot wound and Hydrofera Blue to the right heel wound. She denies signs of infection. 1/4; patient presents for follow-up. She has no issues or complaints today. She denies signs of infection. 1/11; patient presents for follow-up. It is unclear if she has been dressing these wounds over the past week. She currently denies signs of infection. 1/18; patient presents for follow-up. She states she has been using Dakin's wet-to-dry dressings to the left foot. She has been using Hydrofera Blue to the right foot foot wound. She states that the anterior right leg wound has reopened and draining serous fluid. She denies signs of infection. 1/25; patient presents for follow-up. She has no issues or complaints today. 2/1; patient presents  for follow-up. She has no issues or complaints today. She denies signs of infection. 2/8; patient presents for follow-up. She has lost her surgical shoes. She did not have [name] dressing to the right heel wound. She currently denies signs  of infection. 2/15; patient presents for follow-up. She reports more pain to the right heel today. She denies purulent drainage Or fever/chills 2/22; patient presents for follow-up. She reports taking clindamycin over the past week. She states that she continues to have pain to her right heel. She reports purulent drainage. Readmission 03/16/2022 Ms. Nessie Nong is [name] 47 year old female with [name] past medical history of type 2 diabetes, osteomyelitis to her feet, chronic systolic heart failure and bipolar disorder that presents to the clinic for bilateral feet wounds and right lower extremity wound. She was last seen in our clinic on 12/15/2021. At that time she had purulent drainage coming out of her right plantar foot and I recommended she go to the ED. She states she went to [facility] and has been there for the past 3 months. I cannot see the records. She states she had OR debridement and was on several weeks of IV antibiotics while inpatient. Since discharge she has not been taking care of the wound beds. She had nothing on her feet other than socks today. She currently denies signs of infection. 5/31; patient presents for follow-up. She has been using Dakin's wet-to-dry dressings to the wound beds on her feet bilaterally and antibiotic ointment to the right anterior leg wound. She had [name] wound culture done at last clinic visit that showed moderate Pseudomonas aeruginosa sensitive to ciprofloxacin. She currently denies systemic signs of infection. 6/14; patient presents for follow-up. She received Keystone 5 days ago and has been using this on the wound beds. She states that last week she had to go to the hospital because she had increased warmth and erythema to the right foot. She was started on 2 oral antibiotics. She states she has been taking these. She currently denies systemic signs of infection. She has no issues or complaints today. 6/21; patient presents for follow-up. She  states she has been using Keystone antibiotics to the wound beds. She has no issues or complaints today. She denies signs of infection. 6/28; patient presents for follow-up. She has been using Keystone antibiotics to the wound beds. She has no issues or complaints today. 7/12; patient presents for follow-up. Has been using Keystone antibiotics to the wound beds with calcium alginate. She has no issues or complaints today. She never followed up with her orthopedic surgeon who did the OR debridement to the right foot. We discussed the total contact cast for the left foot and patient would like to do this next week. 7/19; patient presents for follow-up. She has been using Keystone antibiotics with calcium alginate to the wound beds. She has no issues or complaints today. Patient is in agreement to do the total contact cast of the left foot today. She knows to return later this week for the obligatory cast change. 05-13-2022 upon evaluation today patient's wound which she has the cast of the left leg actually appears to be doing significantly better. Fortunately I do not see any signs of active infection locally or systemically which is great news and overall I am extremely pleased with where we stand currently. 7/26; patient presents for follow-up. She has [name] cast in place for the past week. She states it irritated her shin. Other than that she tolerated the cast well.  She states she would like [name] break for 1 week from the cast. We have been using Keystone antibiotic and Aquacel to both wound beds. She denies signs of infection. 8/2; patient presents for follow-up. She has been using Keystone and Aquacel to the wound beds. She denies any issues and has no complaints. She is agreeable to have the cast placed today for the left leg. 06-03-2022 upon evaluation today patient appears to be doing well with regard to her wound she saw some signs of improvement which is great news. Fortunately I do not see any  evidence of active infection locally or systemically at this time which is great news. No fevers, chills, nausea, vomiting, or diarrhea. 8/16; patient presents for follow-up. She has no issues or complaints today. We have been using Keystone and Aquacel to the wound beds. The left lower extremity is in [name] total contact cast. She is tolerated this well. 8/23; patient presents for follow-up. She has had the total contact cast on the left leg for the past week. Unfortunately this has rubbed and broken down the skin to the medial foot. She currently denies signs of infection. She has been using Keystone antibiotic to the right plantar foot wound. 8/30; patient presents for follow-up. We have held off on the total contact cast for the left leg for the past week. Her wound on the left foot has improved and the previous surrounding breakdown of skin has epithelialized. She has been using Keystone antibiotic to both wound beds. She has no issues or complaints today. She denies signs of infection. 9/6; patient presents for follow-up. She has ordered her's Keystone antibiotic and this is arriving this week. She has been doing Dakin's wet-to-dry dressings to the wound beds. She denies signs of infection. She is agreeable to the total contact cast today. 9/13; patient presents for follow-up. She states that the cast caused her left leg shin to be sore. She would like to take [name] break from the cast this week. She has been using Keystone antibiotic to the right plantar foot wound. She denies signs of infection. 9/20; patient presents for follow-up. She has been using Keystone antibiotics to the wound beds with calcium alginate to the right foot wound and Hydrofera Blue to the left foot wound. She is agreeable to having the cast placed today. She has been approved for Apligraf and we will order this for next clinic visit. 9/27; patient presents for follow-up. We have been using Keystone antibiotic with Hydrofera Blue  to the left foot wound under [name] total contact cast. T the right o Mcmillon, Katesha [name] ([id number]) [id number] Page 9 of 21 foot wound she has been using Keystone antibiotic and calcium alginate. She declines [name] total contact cast today. Apligraf is available for placement and she would like to proceed with this. 07-28-2022 upon evaluation today patient appears to be doing well currently in regard to her wound. She is actually showing signs of significant improvement which is great news. Fortunately I do not see any evidence of active infection locally nor systemically at this time. She has been seeing Dr. Heber [place] and to be honest has been doing very well with the cast. Subsequently she comes in today with [name] cast on and we did reapply that today as well. She did not really want to she try to talk me out of that but I explained that if she wanted to heal this is really the right way to go. Patient voiced understanding. In regard to her  right foot this is actually [name] lot better compared to the last time I saw her which is also great news. 10/11; patient presents for follow-up. Apligraf and the total contact cast was placed to the left leg at last clinic visit. She states that her right foot wound had burning pain to it with the placement of Apligraf to this area. She has been doing [place] over this area. She denies signs of infection including increased warmth, erythema or purulent drainage. 11/1; 3-week follow-up. The patient fortunately did not have [name] total contact cast or an Apligraf and on the left foot. She has been using Keystone ABD pads and kerlix and her own running shoes She arrives in clinic today with thick callus and [name] very poor surface on the left foot on the right nonviable skin subcutaneous tissue and [name] deep probing hole. 11/15; patient missed her last clinic appointment. She states she has not been dressing the wound beds for the past 2 weeks. She states that at  she had [name] new roommate but is now going back to live with her mother. Apparently its been [name] distracting 2 weeks. Patient currently denies signs of infection. 11/22; patient presents for follow-up. She states she has been using Keystone antibiotic and Dakin's wet-to-dry dressings to the wound beds. She is agreeable for cast placement today. We had ordered Apligraf however this has not been received by our facility. 11/29; Patient had [name] total contact cast placed at last clinic visit and she tolerated this well. We were using silver alginate under the cast. Patient's been using Keystone antibiotic with Aquacel to the right plantar foot wound. She has no issues or complaints today. Apligraf is available for placement today. Patient would like to proceed with this. 12/6; patient presents for follow-up. She had Apligraf placed in standard fashion last clinic visit under the total contact cast to the left lower extremity. She has been using Keystone antibiotic and Aquacel to the right plantar foot wound. She has no issues or complaints today. 12/13; patient presents for follow-up. She has finished 5 Apligraf placements. Was told she would not qualify for more. We have been doing [name] total contact cast to the left lower extremity. She has been using Keystone antibiotic and Aquacel to the right plantar foot wound. She has no issues or complaints today. 12/20; patient presents for follow-up. We have been using Hydrofera Blue with Keystone antibiotic under [name] total contact cast of the left lower extremity. She reports using Keystone antibiotic and silver alginate to the right heel wound. She has no issues or complaints today. 12/27; patient presents with [name] healthy wound on the left midfoot. We have Apligraf to apply that to that more also using [name] total contact cast. On the right we are using Keystone and silver alginate. She is offloading the right heel with [name] surgical shoealthough by her admission she is on her  feet quite [name] bit 1/3; patient presents for follow-up. Apligraf was placed to the wound beds last clinic visit. She was placed in [name] total contact cast to the left lower extremity. She declines [name] total contact cast today. She states that her mother is in the hospital and she cannot adequately get around with the cast on. 1/10; patient presents for follow-up. She declined the total contact cast at last clinic visit. Both wounds have declined in appearance. She states that she has been on her feet and not offloading the wound beds. She currently denies signs of infection. 1/17; patient presents  for follow-up. She had the total contact cast along with Apligraf placed last week to the left lower extremity. She tolerated this well. She has been using Aquacel Ag and Keystone antibiotic to the right heel wound. She currently denies signs of infection. She has no issues or complaints today. 1/24; patient presents for follow-up. We have been using Apligraf to the left foot wound along with [name] total contact cast. She has done well with this. T the right o heel wound she has been using Aquacel Ag and Keystone antibiotic ointment. She has no issues or complaints today. She denies signs of infection. Electronic Signature(s) Signed: 11/16/2022 3:12:59 PM By: Kalman Shan DO Entered By: Kalman Shan on 11/16/2022 12:48:33 -------------------------------------------------------------------------------- Physical Exam Details Patient Name: Date of Service: Heather Hitch [name]. 11/16/2022 11:00 [name]: [id number] Patient Account Number: [id number] Date of Birth/Sex: Treating RN: [birth year] (47 y.o. [name] Primary Care Provider: Raelene Bott Other Clinician: Massie Kluver Referring Provider: Treating Provider/Extender: Linna Hoff Weeks in Treatment: 56 Constitutional . Cardiovascular . AAMINAH, FORRESTER [name] ([id number]) [id number] Page  10 of 21 Psychiatric . Notes Right foot: T the plantar heel there is an incision site with increased depth, No probing to bone. T the opening there is Nonviable tissue and granulation tissue. o o Left foot: T the medial aspect there is an open wound with granulation tissue and nonviable tissue with surrounding callus. No overt signs of infection to any o of the wound beds. Electronic Signature(s) Signed: 11/16/2022 3:12:59 PM By: Kalman Shan DO Entered By: Kalman Shan on 11/16/2022 12:49:03 -------------------------------------------------------------------------------- Physician Orders Details Patient Name: Date of Service: Heather Hitch [name]. 11/16/2022 11:00 [name]: [id number] Patient Account Number: [id number] Date of Birth/Sex: Treating RN: [birth year] (47 y.o. [name] Primary Care Provider: Raelene Bott Other Clinician: Massie Kluver Referring Provider: Treating Provider/Extender: Eddie North in Treatment: 31 Verbal / Phone Orders: No Diagnosis Coding Follow-up Appointments Return Appointment in 1 week. Nurse Visit as needed Bathing/ Shower/ Hygiene May shower with wound dressing protected with water repellent cover or cast protector. No tub bath. Anesthetic (Use 'Patient Medications' Section for Anesthetic Order Entry) Lidocaine applied to wound bed Cellular or Tissue Based Products Cellular or Tissue Based Product Type: - Apligraf applied Cellular or Tissue Based Product applied to wound bed; including contact layer, fixation with steri-strips, dry gauze and cover dressing. (DO NOT REMOVE). Edema Control - Lymphedema / Segmental Compressive Device / Other Elevate, Exercise Daily and [name] void Standing for Long Periods of Time. Elevate legs to the level of the heart and pump ankles as often as possible Elevate leg(s) parallel to the floor when sitting. Off-Loading Total Contact Cast to Left Lower Extremity Open  toe surgical shoe - Heel off loader - Right foot Other: - keep pressure off of feet. Additional Orders / Instructions Follow Nutritious Diet and Increase Protein Intake Other: - Stay off of foot, wheelchair only Medications-Please add to medication list. Wound #11 Right,Plantar Foot Keystone Compound Wound Treatment Wound #11 - Foot Wound Laterality: Plantar, Right Cleanser: Wound Cleanser 1 x Per Day/30 Days Discharge Instructions: Wash your hands with soap and water. Remove old dressing, discard into plastic bag and place into trash. Cleanse the CHRISTYNE, MCCAIN [name] ([id number]) [id number] Page 11 of 21 wound with Wound Cleanser prior to applying [name] clean dressing using gauze sponges, not tissues or cotton balls. Do not scrub or use excessive force. Pat dry using gauze  sponges, not tissue or cotton balls. Topical: keystone gel 1 x Per Day/30 Days Prim Dressing: Aquacel Extra Hydrofiber Dressing, 2x2 (in/in) ary 1 x Per Day/30 Days Secondary Dressing: ABD Pad 5x9 (in/in) (Generic) 1 x Per Day/30 Days Discharge Instructions: Cover with ABD pad Secured With: Medipore T - 20M Medipore H Soft Cloth Surgical T ape ape, 2x2 (in/yd) 1 x Per Day/30 Days Secured With: [employer] Sterile or Non-Sterile 6-ply 4.5x4 (yd/yd) 1 x Per Day/30 Days Discharge Instructions: Apply Kerlix as directed Wound #12 - Foot Wound Laterality: Plantar, Left, Medial Cleanser: Wound Cleanser 1 x Per Day/30 Days Discharge Instructions: Wash your hands with soap and water. Remove old dressing, discard into plastic bag and place into trash. Cleanse the wound with Wound Cleanser prior to applying [name] clean dressing using gauze sponges, not tissues or cotton balls. Do not scrub or use excessive force. Pat dry using gauze sponges, not tissue or cotton balls. Peri-Wound Care: Triamcinolone Acetonide Cream, 0.1%, 15 (g) tube 1 x Per Day/30 Days Discharge Instructions: Apply as directed to irritated  areas. Secondary Dressing: ABD Pad 5x9 (in/in) 1 x Per Day/30 Days Discharge Instructions: Cover with ABD pad Secondary Dressing: Foam Dressing, 4x4 (in/in) 1 x Per Day/30 Days Discharge Instructions: used for padding ankle and forefoot. Electronic Signature(s) Signed: 11/16/2022 3:12:59 PM By: Kalman Shan DO Entered By: Kalman Shan on 11/16/2022 12:50:46 -------------------------------------------------------------------------------- Problem List Details Patient Name: Date of Service: Heather Hitch [name]. 11/16/2022 11:00 [name]: [id number] Patient Account Number: [id number] Date of Birth/Sex: Treating RN: October 16, 1976 (47 y.o. 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 03/16/2022 No Yes E11.621 Type 2 diabetes mellitus with foot ulcer 03/16/2022 No Yes Hougland, Ofelia [name] ([id number]) [id number] Page 12 of 21 E11.42 Type 2 diabetes mellitus with diabetic polyneuropathy 03/16/2022 No Yes L97.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 03/16/2022 No Yes of skin Inactive Problems Resolved Problems Electronic Signature(s) Signed: 11/16/2022 3:12:59 PM By: Kalman Shan DO Entered By: Kalman Shan on 11/16/2022 12:47:44 -------------------------------------------------------------------------------- Progress Note Details Patient Name: Date of Service: Heather Hitch [name]. 11/16/2022 11:00 [name]: [id number] Patient Account Number: [id number] Date of Birth/Sex:  Treating RN: 03/29/1976 (47 y.o. [name] Primary Care Provider: Raelene Bott Other Clinician: Massie Kluver Referring Provider: Treating Provider/Extender: Eddie North in Treatment: 24 Subjective Chief Complaint Information obtained from Patient 03/19/2021; patient referred by Dr. Luana Shu who has been looking after her left foot for quite [name] period of time for review of [name] nonhealing area in the left midfoot 03/12/2022; bilateral feet wounds and right lower extremity wound. History of Present Illness (HPI) 01/18/18-She is here for initial evaluation of the left great toe ulcer. She is [name] poor historian in regards to timeframe in detail. She states approximately 4 weeks ago she lacerated her toe on something in the house. She followed up with her primary care who placed her on Bactrim and ultimately [name] second dose of Bactrim prior to coming to wound clinic. She states she has been treating the toe with peroxide, Betadine and [name] Band-Aid. She did not check  her blood sugar this morning but checked it yesterday morning it was 327; she is unaware of [name] recent A1c and there are no current records. She saw Dr. she would've orthopedics last week for an old injury to the left ankle, she states he did not see her toe, nor did she bring it to his attention. She smokes approximately 1 pack cigarettes [name] day. Her social situation is concerning, she arrives this morning with her mother who appears extremely intoxicated/under the influence; her mother was asked to leave the room and be monitored by the patient's grandmother. The patient's aunt then accompanied the patient and the room throughout the rest of the appointment. We had [name] lengthy discussion regarding the deleterious effects of uncontrolled hyperglycemia and smoking as it relates to wound healing and overall health. She was strongly encouraged to decrease her smoking and get her diabetes under better control. She states she is  currently on [name] diet and has cut down her [facility] consumption. The left toe is erythematous, macerated and slightly edematous with malodor present. The edema in her left foot is below her baseline, there is no erythema streaking. We will treat her with Santyl, doxycycline; we have ordered and xray, culture and provided [name] Peg assist surgical shoe and cultured the wound. 01/25/18-She is here in follow-up evaluation for [name] left great toe ulcer and presents with an abscess to her suprapubic area. She states her blood sugars remain elevated, feeling "sick" and if levels are below 250, but she is trying. She has made no attempt to decrease her smoking stating that we "can't take away her food in her cigarettes". She has been compliant with offloading using the PEG assist you. She is using Santyl daily. the culture obtained last week grew staph aureus and Enterococcus faecalis; continues on the doxycycline and Augmentin was added on Monday. The suprapubic area has erythema, no femoral variation, purple discoloration, minimal induration, was accessed with [name] cotton tip applicator with sanguinopurulent drainage, this was cultured, I suspect the current antibiotic treatment will cover and we will not add anything to her current treatment plan. She was advised to go to urgent care or ER with any change in redness, induration or fever. 02/01/18-She is here in follow-up evaluation for left great toe ulcers and [name] new abdominal abscess from last week. She was able to use packing until earlier this week, where she "forgot it was there". She states she was feeling ill with GI symptoms last week and was not taking her antibiotic. She states her glucose levels have been predominantly less than 200, with occasional levels between 200-250. She thinks this was contributing to her GI symptoms as they have resolved without intervention. There continues to be significant laceration to left toe, otherwise it clinically looks  stable/improved. There is now less superficial opening to the lateral aspect of the great toe that was residual blister. We will transition to [facility] to all wounds, she will continue her Augmentin. If there is no change or deterioration next week for reculture. 02/08/18-She is here in follow-up evaluation for left great toe ulcer and abdominal ulcer. There is an improvement in both wounds. She has been wrapping her left toe with coban, not by our direction, which has created an area of discoloration to the medial aspect; she has been advised to NOT use coban secondary to her neuropathy. She states her glucose levels have been high over this last week ranging from 200-350, she continues to smoke. She admits to  being less compliant with her offloading shoe. We will continue with same treatment plan and she will follow-up next week. 02/15/18-She is here in follow-up evaluation for left great toe ulcer and abdominal ulcer. The abdominal ulcer is epithelialized. The left great toe ulcer is improved Martinek, Armani [name] ([id number]) [id number] Page 13 of 21 and all injury from last week using the Coban wrap is resolved, the lateral ulcer is healed. She admits to noncompliance with wearing offloading shoe and admits to glucose levels being greater than 300 most of the week. She continues to smoke and expresses no desire to quit. There is one area medially that probes deeper than it has historically, erythema to the toe and dorsal foot has consistently waxed and waned. There is no overt signs of cellulitis or infection but we will culture the wound for any occult infection given the new area of depth and erythema. We will hold off on sensitivities for initiation of antibiotic therapy. 02/22/18-She is here in follow up evaluation for left great toe ulcer. There is overall significant improvement in both wound appearance, erythema and edema with changes made last week. She was not  initiated on antibiotic therapy. Culture obtained last week showed oxacillin sensitive staph aureus, sensitive to clindamycin. Clindamycin has been called into the pharmacy but she has been instructed to hold off on initiation secondary to overall clinical improvement and her history of antibiotic intolerance. She has been instructed to contact the clinic with any noted changes/deterioration and the wound, erythema, edema and/or pain. She will follow-up next week. She continues to smoke and her glucose levels remain elevated >250; she admits to compliance with offloading shoe 03/01/18 on evaluation today patient appears to be doing fairly well in regard to her left first toe ulcer. She has been tolerating the dressing changes with the [facility] Dressing without complication and overall this has definitely showed signs of improvement according to records as well is what the patient tells me today. I'm very pleased in that regard. She is having no pain today 03/08/18 She is here for follow up evaluation of [name] left great toe ulcer. She remains non-compliant with glucose control and smoking cessation; glucose levels consistently >200. She states that she got new shoe inserts/peg assist. She admits to compliance with offloading. Since my last evaluation there is significant improvement. We will switch to prisma at this time and she will follow up next week. She is noted to be tachycardic at this appointment, heart rate 120s; she has [name] history of heart rate 70-130 according to our records. She admits to extreme agitation r/t personal issues; she was advised to monitor her heartrate and contact her physician if it does not return to [name] more normal range (<100). She takes cardizem twice daily. 03/15/18-She is here in follow-up evaluation for left great toe ulcer. She remains noncompliant with glucose control and smoking cessation. She admits to compliance with wearing offloading shoe. The ulcer is  improved/stable and we will continue with the same treatment plan and she will follow-up next week 03/22/18-She is here for evaluation for left great toe ulcer. There continues to be significant improvement despite recurrent hyperglycemia (over 500 yesterday) and she continues to smoke. She has been compliant with offloading and we will continue with same treatment plan and she will follow-up next week. 03/29/18-She is here for evaluation for left great toe ulcer. Despite continuing to smoke and uncontrolled diabetes she continues to improve. She is compliant with offloading shoe. We will continue with  the same treatment plan and she will follow-up next week 04/05/18- She is here in follow up evaluation for [name] left great toe ulcer; she presents with small pustule to left fifth toe (resembles ant bite). She admits to compliance with wearing offloading shoe; continues to smoke or have uncontrolled blood glucose control. There is more callus than usual with evidence of bleeding; she denies known trauma. 04/12/18-She is here for evaluation of left great toe ulcer. Despite noncompliance with glycemic control and smoking she continues to make improvement. She continues to wear offloading shoe. The pustule, that was identified last week, to the left fifth toe is resolved. She will follow-up in 2 weeks 05/03/18-she is seen in follow-up evaluation for [name] left great toe ulcer. She is compliant with offloading, otherwise noncompliant with glycemic control and smoking. She has plateaued and there is minimal improvement noted. We will transition to [facility], replaced the insert to her surgical shoe and she will follow-up in one week 05/10/18- She is here in follow up evaluation for [name] left great toe ulcer. It appears stable despite measurement change. We will continue with same treatment plan and follow up next week. 05/24/18-She is seen in follow-up evaluation for [name] left great toe ulcer. She remains compliant with  offloading, has made significant improvement in her diet, decreasing the amount of sugar/soda. She said her recent A1c was 10.9 which is lower than. She did see [name] diabetic nutritionist/educator yesterday. She continues to smoke. We will continue with the same treatment plan and she'll follow-up next week. 05/31/18- She is seen in follow-up evaluation for left great toe ulcer. She continues to remain compliant with offloading, continues to make improvement in her diet, increasing her water and decreasing the amount of sugar/soda. She does continue to smoke with no desire to quit. We will apply Prisma to the depth and Hydrofera Blue over. We have not received insurance authorization for oasis. She will follow up next week. 06/07/18-She is seen in follow-up evaluation for left great toe ulcer. It has stalled according to today's measurements although base appears stable. She says she saw [name] diabetic educator yesterday; her average blood sugars are less than 300 which is an improvement for her. She continues to smoke and states "that's my next step" She continues with water over soda. We will order for xray, culture and reinstate ace wrap compression prior to placing apligraf for next week. She is voicing no complaints or concerns. Her dressing will change to iodoflex over the next week in preparation for apligraf. 06/14/18-She is seen in follow-up evaluation for left great toe ulcer. Plain film x-ray performed last week was negative for osteomyelitis. Wound culture obtained last week grew strep B and OSSA; she is initiated on keflex and cefdinir today; there is erythema to the toe which could be from ace wrap compression, she has [name] history of wrapping too tight and has has been encouraged to maintain ace wraps that we place today. We will hold off on application of apligraf today, will apply next week after antibiotic therapy has been initiated. She admits today that she has resumed taking [name] shower with her  foot/toe submerged in water, she has been reminded to keep foot/toe out of the bath water. She will be seen in follow up next week 06/21/18-she is seen in follow-up evaluation for left great toe ulcer. She is tolerating antibiotic therapy with no GI disturbance. The wound is stable. Apligraf was applied today. She has been decreasing her smoking, only had  4 cigarettes yesterday and 1 today. She continues being more compliant in diabetic diet. She will follow-up next week for evaluation of site, if stable will remove at 2 weeks. 06/28/18- She is here in follow up evalution. Apligraf was placed last week, she states the dressing fell off on Tuesday and she was dressing with hydrofera blue. She is healed and will be discharged from the clinic today. She has been instructed to continue with smoking cessation, continue monitoring glucose levels, offloading for an additional 4 weeks and continue with hydrofera blue for additional two weeks for any possible microscopic opening. Readmission: 08/07/18 on evaluation today patient presents for reevaluation concerning the ulcer of her right great toe. She was previously discharged on 06/28/18 healed. Nonetheless she states that this began to show signs of drainage she subsequently went to her primary care provider. Subsequently an x-ray was performed on 08/01/18 which was negative. The patient was also placed on antibiotics at that time. Fortunately they should have been effective for the infection. Nonetheless she's been experiencing some improvement but still has [name] lot of drainage coming from the wound itself. 08/14/18 on evaluation today patient's wound actually does show signs of improvement in regard to the erythema at this point. She has completed the antibiotics. With that being said we did discuss the possibility of placing her in [name] total contact cast as of today although I think that I may want to give this just [name] little bit more time to ensure nothing  recurrence as far as her infection is concerned. I do not want to put in the cast and risk infection at that time if things are not completely resolved. With that being said she is gonna require some debridement today. 08/21/18 on evaluation today patient actually appears to be doing okay in regard to her toe ulcer. She's been tolerating the dressing changes without complication. With that being said it does appear that she is ready and in fact I think it's appropriate for [place] to go ahead and initiate the total contact cast today. Nonetheless she will require some sharp debridement to prepare the wound for application. Overall I feel like things have been progressing well but we do need to do something to get this to close more readily. 08/24/18 patient seen today for reevaluation after having had the total contact cast applied on Tuesday. She seems to have done very well the wound appears to be doing great and overall I'm pleased with the progress that she's made. There were no abnormal areas of rubbing from the cast on her lower extremity. 08/30/18 on evaluation today patient actually appears to be completely healed in regard to her plantar toe ulcer. She tells me at this point she's been having [name] lot of issues with the cast. She almost fell [name] couple of times the state shall the step of her dog [name] couple times as well. This is been [name] very frustrating process for her other nonetheless she has completely healed the wound which is excellent news. Overall there does not appear to be the evidence of infection at this time which is great news. 09/11/18 evaluation today patient presents for follow-up concerning her great toe ulcer on the left which has unfortunately reopened since I last saw her which was only [name] couple of weeks ago. Unfortunately she was not able to get in to get the shoe and potentially the AFO that's gonna be necessary due to her left foot drop. She continues with offloading shoe but this  is  not enough to prevent her from reopening it appears. When we last had her in the total contact cast she did well from [name] healing standpoint but unfortunately the wound reopened as soon as she came out of the cast within just [name] couple of weeks. 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 This is also part of the reason why she falls on [name] regular basis. I just do not believe that is gonna be safe for [place] to reinitiate the total contact cast as last time we had this on she fell 3 times one week which is definitely not normal for her. 09/18/18 upon evaluation today the patient actually appears to be doing about the same in regard to her toe ulcer. She did not contact [facility] as I asked her to SHANETTE, TAMARGO ([id number]) [id number] Page 14 of 21 even though I had given her the prescription. In fact she actually states that she has no idea where the prescription is. She did apparently call [facility] and they told her that all she needed to do was bring the prescription in order to be able to be seen and work on getting the AFO for her left foot. With all that being said she still does not have an appointment and I'm not sure were things stand that regard. I will give her [name] new prescription today in order to contact them to get this set up. 09/25/18 on evaluation today patient actually appears to be doing about the same in regard to her toes ulcer. She does have [name] small areas which seems to have [name] lot of callous buildup around the edge of the wound which is going to need sharp debridement today. She still is waiting to be scheduled for evaluation with [facility] for possibility of an AFO. She states there supposed to call her tomorrow to get this set up. Unfortunately it does appear that her  foot specifically the toe area is showing signs of erythema. There does not appear to be any systemic infection which is in these good news. 10/02/18 on evaluation today patient actually appears to be doing about the same in regard to her toe ulcer. This really has not done too well although it's not significantly larger it's also not significantly smaller. She has been tolerating the dressing changes without complication. She actually has her appointment with [facility] and [place] tomorrow to hopefully be measured for obtaining and AFO splint. I think this would be helpful preventing this from reoccurring. We had contemplated starting the cast this week although to be honest I am reluctant to do that as she's been having nausea, vomiting, and seizure activity over the past three days. She has [name] history of seizures and have been told is nothing that can be done for these. With that being said I do believe that along with the seizures have the nausea vomiting which upon further questioning doesn't seem to be the normal for her and makes me concerned for the possibility of infection or something else going on. I discussed this with the patient and her mother during the office visit today. I do not feel the wound is effective but maybe something else. The responses this was "this just happens to her at times and we don't know why".  They did not seem to be interested in going to the hospital to have this checked out further. 10/09/18 on evaluation today patient presents for follow-up concerning her ongoing toe ulcer. She has been tolerating the dressing changes without complication. Fortunately there does not appear to be any evidence of infection which is great news however I do think that the patient would benefit from going ahead for with the total contact cast. She's actually in [name] wheelchair today she tells me that she will use her walker if we initiate the cast. I was very specific about the fact that  if we were gonna do the cast I wanted to make sure that she was using the walker in order to prevent any falls. She tells me she does not have stairs that she has to traverse on [name] regular basis at her home. She has not had any seizures since last week again that something that happens to her often she tells me she did talk to [employer] and they said that it may take up to three weeks to get the brace approved for her. Hopefully that will not take that long but nonetheless in the meantime I do think the cast could be of benefit. 10/12/18 on evaluation today patient appears to be doing rather well in regard to her toe ulcer. It's just been [name] few days and already this is significantly improved both as far as overall appearance and size. Fortunately there's no sign of infection. She is here for her first obligatory cast change. 10/19/18 Seen today for follow up and management of left great toe ulcer. Wound continues to show improvement. Noted small open area with seroussang drainage with palpation. Denies any increased pain or recent fevers during visit. She will continue calcium alginate with offloading shoe. Denies any questions or concerns during visit. 10/26/18 on evaluation today patient appears to be doing about the same as when I last saw her in regard to her wound bed. Fortunately there does not appear to be any signs of infection. Unfortunately she continues to have [name] breakdown in regard to the toe region any time that she is not in the cast. It takes almost no time at all for this to happen. Nonetheless she still has not heard anything from the brace being made by [employer] as to when exactly this will be available to her. Fortunately there is no signs of infection at this time. 10/30/18 on evaluation today patient presents for application of the total contact cast as we just received him this morning. Fortunately we are gonna be able to apply this to her today which is great news. She continues to have  no significant pain which is good news. Overall I do feel like things have been improving while she was the cast is when she doesn't have [name] cast that things get worse. She still has not really heard anything from [facility] regarding her brace. 11/02/18 upon evaluation today patient's wound already appears to be doing significantly better which is good news. Fortunately there does not appear to be any signs of infection also good news. Overall I do think the total contact cast as before is helping to heal this area unfortunately it's just not gonna likely keep the area closed and healed without her getting her brace at least. Again the foot drop is [name] significant issue for her. 11/09/18 on evaluation today patient appears to be doing excellent in regard to her toe ulcer which in fact is completely healed. Fortunately we  finally got the situation squared away with the paperwork which was needed to proceed with getting her brace approved by Medicaid. I have filled that out unfortunately that information has been sent to the orthopedic office that I worked at 2 1/2 years ago and not tired Current wound care measures. Fortunately she seems to be doing very well at this time. 11/23/18 on evaluation today patient appears to be doing More Poorly Compared to Last Time I Saw Her. At [facility] She Had Completely Healed. Currently she is continuing to have issues with reopening. She states that she just found out that the brace was approved through Medicaid now she just has to go get measured in order to have this fitted for her and then made. Subsequently she does not have an appointment for this yet that is going to complicate things we obviously cannot put her back in the cast if we do not have everything measured because they're not gonna be able to measure her foot while she is in the cast. Unfortunately the other thing that I found out today as well is that she was in the hospital over the weekend due to having [name]  heroin overdose. Obviously this is unfortunate and does have me somewhat worried as well. 11/30/18 on evaluation today patient's toe ulcer actually appears to be doing fairly well. The good news is she will be getting her brace in the shoes next week on Wednesday. Hopefully we will be able to get this to heal without having to go back in the cast however she may need the cast in order to get the wound completely heal and then go from there. Fortunately there's no signs of infection at this time. 12/07/18 on evaluation today patient fortunately did receive her brace and she states she could tell this definitely makes her walk better. With that being said she's been having issues with her toe where she noticed yesterday there was [name] lot of tissue that was loosing off this appears to be much larger than what it was previous. She also states that her leg has been read putting much across the top of her foot just about the ankle although this seems to be receiving somewhat. The total area is still red and appears to be someone infected as best I can tell. She is previously taken Bactrim and that may be [name] good option for her today as well. We are gonna see what I wound culture shows as well and I think that this is definitely appropriate. With that being said outside of the culture I still need to initiate something in the interim and that's what I'm gonna go ahead and select Bactrim is [name] good option for her. 12/14/18 on evaluation today patient appears to be doing better in regard to her left great toe ulcer as compared to last week's evaluation. There's still some erythema although this is significantly improved which is excellent news. Overall I do believe that she is making good progress is still gonna take some time before she is where I would like her to be from the standpoint of being able to place her back into the total contact cast. Hopefully we will be where we need to be by next week. 12/21/18 on  evaluation today patient actually appears to be doing poorly in regard to her toe ulcer. She's been tolerating the dressing changes without complication. Fortunately there's no signs of systemic infection although she does have [name] lot of drainage from the toe  ulcer and this does seem to be causing some issues at this point. She does have erythema on the distal portion of her toe that appears to be likely cellulitis. 12/28/18 on evaluation today patient actually appears to be doing [name] little better in my pinion in regard to her toe ulcer. With that being said she still does have some evidence of infection at this time and for her culture she had both E. coli as well as enterococcus as organisms noted on evaluation. For that reason I think that though the Keflex likely has treated the E. coli rather well this has really done nothing for the enterococcus. We are going to have to initiate treatment for this specifically. 01/04/19 on evaluation today patient's toe actually appears to be doing better from the standpoint of infection. She currently would like to see about putting the cash back on I think that this is appropriate as long as she takes care of it and keeps it from getting wet. She is gonna have some drainage we can definitely pass this up with Drawtex and alginate to try to prevent as much drainage as possible from causing the problems. With that being said I do want to at least try her with the cast between now and Tuesday. If there any issues we can't continue to use it then I will discontinue the use of the cast at that point. 01/08/19 on evaluation today patient actually appears to be doing very well as far as her foot ulcer specifically the great toe on the left is concerned. She did have an area of rubbing on the medial aspect of her left ankle which again is from the cast. Fortunately there's no signs of infection at this point in this MARIKO, NOWAKOWSKI [name] ([id number])  [id number] Page 15 of 21 appears to be [name] very slight skin breakdown. The patient tells me she felt it rubbing but didn't think it was that bad. Fortunately there is no signs of active infection at this time which is good news. No fevers, chills, nausea, or vomiting noted at this time. 01/15/19 on evaluation today patient actually appears to be doing well in regard to her toe ulcer. Again as previous she seems to do well and she has the cast on which indicates to me that during the time she doesn't have [name] cast on she's putting way too much pressure on this region. Obviously I think that's gonna be an issue as with the current national emergency concerning the Covid-19 Virus it has been recommended that we discontinue the use of total contact casting by the chief medical officer of our company, Dr. Simona Huh. The reasoning is that if [name] patient becomes sick and cannot come into have the cast removed they could not just leave this on for an additional two weeks. Obviously the hospitals also do not want to receive patient's who are sick into the emergency department to potentially contaminate the region and spread the Covid-19 Virus among other sick individuals within the hospital system. Therefore at this point we are suspending the use of total contact cast until the current emergency subsides. This was all discussed with the patient today as well. 01/22/19 on evaluation today patient's wound on her left great toe appears to be doing slightly worse than previously noted last week. She tells me that she has been on this quite [name] bit in fact she tells me she's been awake for 38 straight hours. This is due to the fact that she's having to  care for grandparents because nobody else will. She has been taking care of them for five the last seven days since I've seen her they both have dementia his is from [name] stroke and her grandmother's was progressive. Nonetheless she states even her mom  who knows her condition and situation has only help two of those days to take care of them she's been taking care of the rest. Fortunately there does not appear to be any signs of active infection in regard to her toe at this point although obviously it doesn't look as good as it did previous. I think this is directly related to her not taking off the pressure and friction by way of taking things easy. Though I completely understand what's going on. 01/29/19 on evaluation today patient's tools are actually appears to be showing some signs of improvement today compared to last week's evaluation as far as not necessarily the overall size of the wound but the fact that she has some new skin growth in between the two ends of the wound opening. Overall I feel like she has done well she states that she had [name] family member give her what sounds to be [name] CAM walker boot which has been helpful as well. 02/05/19 on evaluation today patient's wound bed actually appears to be doing significantly better in regard to her overall appearance of the size of the wound. With that being said she is still having an issue with offloading efficiently enough to get this to close. Apparently there is some signs of infection at this point as well unfortunately. Previously she's done well of Augmentin I really do not see anything that needs to be culture currently but there theme and cellulitis of the foot that I'm seeing I'm gonna go ahead and place her on an antibiotic today to try to help clear this up. 02/12/2019 on evaluation today patient actually appears to be doing poorly in regard to her overall wound status. She tells me she has been using her offloading shoe but actually comes in today wearing her tennis shoe with the AFO brace. Again as I previously discussed with her this is really not sufficient to allow the area to heal appropriately. Nonetheless she continues to be somewhat noncompliant and I do wonder based on what she  has told my nurse in the past as to whether or not [name] good portion of this noncompliance may be recreational drug and alcohol related. She has had [name] history of heroin overdose and this was fairly recently in the past couple of months that have been seeing her. Nonetheless overall I feel like her wound looks significantly worse today compared to what it was previous. She still has significant erythema despite the Augmentin I am not sure that this is an appropriate medication for her infection I am also concerned that the infection may have gone down into her bone. 02/19/19 on evaluation today patient actually appears to be doing about the same in regard to her toe ulcer. Unfortunately she continues to show signs of bone exposure and infection at this point. There does not appear to be any evidence of worsening of the infection but I'm also not really sure that it's getting significantly better. She is on the Augmentin which should be sufficient for the Staphylococcus aureus infection that she has at this point. With that being said she may need IV antibiotics to more appropriately treat this. We did have [name] discussion today about hyperbaric option therapy. 02/28/19 on evaluation today  patient actually appears to be doing much worse in regard to the wound on her left great toe as compared to even my previous evaluation last week. Unfortunately this seems to be training in [name] pretty poor direction. Her toe was actually now starting to angle laterally and I can actually see the entire joint area of the proximal portion of the digit where is the distal portion of the digit again is no longer even in contact with the joint line. Unfortunately there's [name] lot more necrotic tissue around the edge and the toe appears to be showing signs of becoming gangrenous in my pinion. I'm very concerned about were things stand at this point. She did see infectious disease and they are planning to send in [name] prescription for Sivextro  for her and apparently this has been approved. With that being said I don't think she should avoid taking this but at the same time I'm not sure that it's gonna be sufficient to save her toe at this point. She tells me that she still having to care for grandparents which I think is putting quite [name] bit of strain on her foot and specifically the total area and has caused this to break down even to [name] greater degree than would've otherwise been expected. 03/05/19 on evaluation today patient actually appears to be doing quite well in regard to her toe all things considering. She still has bone exposed but there appears to be much less your thing on overall the appearance of the wound and the toe itself is dramatically improved. She still does have some issues currently obviously with infection she did see vascular as well and there concerned that her blood flow to the toad. For that reason they are setting up for an angiogram next week. 03/14/19 on evaluation today patient appears to be doing very poor in regard to her toe and specifically in regard to the ulceration and the fact that she's starting to notice the toe was leaning even more towards the lateral aspect and the complete joint is visible on the proximal aspect of the joint. Nonetheless she's also noted [name] significant odor and the tip of the toe is turning more dark and necrotic appearing. Overall I think she is getting worse not better as far as this is concerned. For that reason I am recommending at this point that she likely needs to be seen for likely amputation. READMISSION 03/19/2021 This is [name] patient that we cared for in this clinic for [name] prolonged period of time in 2019 and 2020 with [name] left foot and left first toe wound. 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 Luana Shu. In December 2021 she had an ulcer on her right great  toe as well as the fourth and fifth toes. 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 She also had an angiogram at that time and underwent angioplasty of the right anterior tibial artery. In any case she claims that the wound on the right foot is closed I did not look at this today which was probably an oversight although I think that should be done next week. 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 According to Dr. Deborra Medina last review that she was out of the area being cared for by another physician but recently came back to  his attention. 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 [name] culture of this area showed E. coli apparently before she came back to see Dr. Luana Shu she was supposed to be receiving antibiotics but she did not really take them. Nor is she offloading this area at all. Finally her last hemoglobin A1c listed in epic was in March 2022 at 14.1 she says things are [name] lot better since then although I am not sure. 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 She had this wound at the time. During this admission she had E. coli sepsis [name] TEE was negative. 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 She is still [name] heavy cigarette smoker. She has [name] history of bipolar disorder seizure disorder ABI in our clinic was 1.05 6/1; left midfoot in the setting of [name] TMA done previously. Round circular wound with [name] "knuckle" of protruding tissue. The problem is that the knuckle was not attached to any of the surrounding granulation and this probed proximally widely I removed [name] large  portion of this tissue. This wound goes with considerable undermining laterally. I do not feel any bone there was no purulence but this is [name] deep wound. 6/8; in spite of the debridement I did last week. She arrives with [name] wound looking exactly the same. [name] protruding "knuckle" of tissue nonadherent to most of the surrounding tissue. There is considerable depth around this from 6-12 o'clock at 2.7 cm and undermining of 1 cm. This does not look overtly infected and the x- ray I did last week was negative for any osseous abnormalities. We have been using silver collagen 6/15; deep tissue culture I did last week showed moderate staph aureus and moderate Pseudomonas. This will definitely require prolonged antibiotic therapy. The pathology on the protuberant area was negative for malignancy fungus etc. 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 We have been using silver collagen. I am going to be prescribing Levaquin for 2 weeks[address] Her CT scan of the foot is down for 7/5 6/22; CT scan of the foot on 7 5. She says she has hardware in the left leg from her previous fracture. She is on the Levaquin for the deep tissue culture I did Hayworth, Olivette [name] ([id number]) [id number] Page 16 of 21 that showed methicillin sensitive staph aureus and Pseudomonas. I gave her [name] 2-week supply and she will have another week. She arrives in clinic today with the same protuberant tissue however this is nonadherent to the tissue surrounding it. I am really at [name] loss to explain this unless there is underlying deep tissue infection 6/29; patient presents for 1 week follow-up. She has been using collagen to the wound bed. She reports taking her antibiotics as prescribed.She has no complaints or issues today. She denies signs of infection. 7/6; patient presents for one week followup. She has been using collagen to the wound bed. She states she is taking  Levaquin however at times she is not able to keep it down. She denies signs of infection. 7/13; patient presents for 1 week follow-up. She has been using silver alginate to the wound bed. She still has nausea when taking her antibiotics. She denies signs  of infection. 7/20; patient presents for 1 week follow-up. She has been using silver alginate with gentamicin cream to the wound bed. She denies any issues and has no complaints today. She denies signs of infection. 7/27; patient presents for 1 week follow-up. She continues to use silver alginate with gentamicin cream to the wound bed. She reports starting her antibiotics. She has no issues or complaints. Overall she reports stability to the wound. 8/3; patient presents for 1 week follow-up. She has been using silver alginate with gentamicin cream to the wound bed. She reports completing all antibiotics. She has no issues or complaints today. She denies signs of infection. 8/17; patient presents for 2-week follow-up. He is to use silver alginate to the wound bed. She has no issues or complaints today. She denies signs of infection. She reports her pain has improved in her foot since last clinic visit 8/24; patient presents for 1 week follow-up. She continues to use silver alginate to the wound bed. She has no issues or complaints. She denies signs of infection. Pain is stable. 9/7; patient presents for follow-up. She missed her last week appointment due to feeling ill. She continues to use silver alginate. She has [name] new wound to the right lower extremity that is covered in eschar. She states It occurred over the past week and has no idea how it started. She currently denies signs of infection. 9/14; patient presents for follow-up. T the left foot wound she has been using gentamicin cream and silver alginate. T the right lower extremity wound she has o o been keeping this covered and has not obtain Santyl. 9/21; patient presents for follow-up. She  reports using gentamicin cream and silver alginate to the left foot and Santyl to the right lower extremity wound. She has no issues or complaints today. She denies signs of infection. 9/28; patient presents for follow-up. She reports [name] new wound to her right heel. She states this occurred [name] few days ago and is progressively gotten worse. She has been trying to clean the area with [name] Q-tip and Santyl. She reports stability in the other 2 wounds. She has been using gentamicin cream and silver alginate to the left foot and Santyl to the right lower extremity wound. 10/12; patient presents for follow-up. She reports improvement to the wound beds. She is seeing vein and vascular to discuss the potential of [name] left BKA. She states they are going to do an arteriogram. She continues to use silver alginate with dressing changes to her wounds. 11/2; patient presents for follow-up. She states she has not been doing dressing changes to the wound beds. She states she is not able to offload the areas. She reports chronic pain to her left foot wound. 11/9; patient presents for follow-up. She came in with only socks on. She states she forgot to put on shoes. It is unclear if she is doing any dressing changes. She currently denies systemic signs of infection. 11/16; patient presents for follow-up. She came again only with socks on. She states she does not wear shoes ever. It is unclear if she does dressing changes. She currently denies systemic signs of infection. 11/23; patient presents for follow-up. She wore her shoes today. It still unclear exactly what dressing she is using for each wound but she did states she obtained Dakin's solution and has been using this to the left foot wound. She currently denies signs of infection. 11/30; patient presents for follow-up. She has no issues or complaints today. She  currently denies signs of infection. 12/7; patient presents for follow-up. She has no issues or complaints  today. She has been using Hydrofera Blue to the right heel wound and Dakin solution to the left foot wound. Her right anterior leg wound is healed. She currently denies signs of infection. 12/14; patient presents for follow-up. She has been using Hydrofera Blue to the right heel and Dakin's to the left foot wounds. She has no issues or complaints today. She denies signs of infection. 12/21; patient presents for follow-up. She reports using Hydrofera Blue to the right heel and Dakin's to the left foot wound. She denies signs of infection. 12/28; patient presents for follow-up. She continues to use Dakin's to the left foot wound and Hydrofera Blue to the right heel wound. She denies signs of infection. 1/4; patient presents for follow-up. She has no issues or complaints today. She denies signs of infection. 1/11; patient presents for follow-up. It is unclear if she has been dressing these wounds over the past week. She currently denies signs of infection. 1/18; patient presents for follow-up. She states she has been using Dakin's wet-to-dry dressings to the left foot. She has been using Hydrofera Blue to the right foot foot wound. She states that the anterior right leg wound has reopened and draining serous fluid. She denies signs of infection. 1/25; patient presents for follow-up. She has no issues or complaints today. 2/1; patient presents for follow-up. She has no issues or complaints today. She denies signs of infection. 2/8; patient presents for follow-up. She has lost her surgical shoes. She did not have [name] dressing to the right heel wound. She currently denies signs of infection. 2/15; patient presents for follow-up. She reports more pain to the right heel today. She denies purulent drainage Or fever/chills 2/22; patient presents for follow-up. She reports taking clindamycin over the past week. She states that she continues to have pain to her right heel. She reports purulent  drainage. Readmission 03/16/2022 Ms. Leveta Wahab is [name] 47 year old female with [name] past medical history of type 2 diabetes, osteomyelitis to her feet, chronic systolic heart failure and bipolar disorder that presents to the clinic for bilateral feet wounds and right lower extremity wound. She was last seen in our clinic on 12/15/2021. At that time she had purulent drainage coming out of her right plantar foot and I recommended she go to the ED. She states she went to [facility] and has been there for the past 3 months. I cannot see the records. She states she had OR debridement and was on several weeks of IV antibiotics while inpatient. Since discharge she has not been taking care of the wound beds. She had nothing on her feet other than socks today. She currently denies signs of infection. 5/31; patient presents for follow-up. She has been using Dakin's wet-to-dry dressings to the wound beds on her feet bilaterally and antibiotic ointment to the right anterior leg wound. She had [name] wound culture done at last clinic visit that showed moderate Pseudomonas aeruginosa sensitive to ciprofloxacin. She currently denies systemic signs of infection. 6/14; patient presents for follow-up. She received Keystone 5 days ago and has been using this on the wound beds. She states that last week she had to go to the hospital because she had increased warmth and erythema to the right foot. She was started on 2 oral antibiotics. She states she has been taking these. She currently denies systemic signs of infection. She has no issues or complaints  todaySHAMINA, ETHERIDGE ([id number]) [id number] Page 17 of 21 6/21; patient presents for follow-up. She states she has been using Keystone antibiotics to the wound beds. She has no issues or complaints today. She denies signs of infection. 6/28; patient presents for follow-up. She has been using Keystone antibiotics to the wound beds. She  has no issues or complaints today. 7/12; patient presents for follow-up. Has been using Keystone antibiotics to the wound beds with calcium alginate. She has no issues or complaints today. She never followed up with her orthopedic surgeon who did the OR debridement to the right foot. We discussed the total contact cast for the left foot and patient would like to do this next week. 7/19; patient presents for follow-up. She has been using Keystone antibiotics with calcium alginate to the wound beds. She has no issues or complaints today. Patient is in agreement to do the total contact cast of the left foot today. She knows to return later this week for the obligatory cast change. 05-13-2022 upon evaluation today patient's wound which she has the cast of the left leg actually appears to be doing significantly better. Fortunately I do not see any signs of active infection locally or systemically which is great news and overall I am extremely pleased with where we stand currently. 7/26; patient presents for follow-up. She has [name] cast in place for the past week. She states it irritated her shin. Other than that she tolerated the cast well. She states she would like [name] break for 1 week from the cast. We have been using Keystone antibiotic and Aquacel to both wound beds. She denies signs of infection. 8/2; patient presents for follow-up. She has been using Keystone and Aquacel to the wound beds. She denies any issues and has no complaints. She is agreeable to have the cast placed today for the left leg. 06-03-2022 upon evaluation today patient appears to be doing well with regard to her wound she saw some signs of improvement which is great news. Fortunately I do not see any evidence of active infection locally or systemically at this time which is great news. No fevers, chills, nausea, vomiting, or diarrhea. 8/16; patient presents for follow-up. She has no issues or complaints today. We have been using Keystone  and Aquacel to the wound beds. The left lower extremity is in [name] total contact cast. She is tolerated this well. 8/23; patient presents for follow-up. She has had the total contact cast on the left leg for the past week. Unfortunately this has rubbed and broken down the skin to the medial foot. She currently denies signs of infection. She has been using Keystone antibiotic to the right plantar foot wound. 8/30; patient presents for follow-up. We have held off on the total contact cast for the left leg for the past week. Her wound on the left foot has improved and the previous surrounding breakdown of skin has epithelialized. She has been using Keystone antibiotic to both wound beds. She has no issues or complaints today. She denies signs of infection. 9/6; patient presents for follow-up. She has ordered her's Keystone antibiotic and this is arriving this week. She has been doing Dakin's wet-to-dry dressings to the wound beds. She denies signs of infection. She is agreeable to the total contact cast today. 9/13; patient presents for follow-up. She states that the cast caused her left leg shin to be sore. She would like to take [name] break from the cast this week. She has been using Keystone  antibiotic to the right plantar foot wound. She denies signs of infection. 9/20; patient presents for follow-up. She has been using Keystone antibiotics to the wound beds with calcium alginate to the right foot wound and Hydrofera Blue to the left foot wound. She is agreeable to having the cast placed today. She has been approved for Apligraf and we will order this for next clinic visit. 9/27; patient presents for follow-up. We have been using Keystone antibiotic with Hydrofera Blue to the left foot wound under [name] total contact cast. T the right o foot wound she has been using Keystone antibiotic and calcium alginate. She declines [name] total contact cast today. Apligraf is available for placement and she would like to  proceed with this. 07-28-2022 upon evaluation today patient appears to be doing well currently in regard to her wound. She is actually showing signs of significant improvement which is great news. Fortunately I do not see any evidence of active infection locally nor systemically at this time. She has been seeing Dr. Heber Kincaid and to be honest has been doing very well with the cast. Subsequently she comes in today with [name] cast on and we did reapply that today as well. She did not really want to she try to talk me out of that but I explained that if she wanted to heal this is really the right way to go. Patient voiced understanding. In regard to her right foot this is actually [name] lot better compared to the last time I saw her which is also great news. 10/11; patient presents for follow-up. Apligraf and the total contact cast was placed to the left leg at last clinic visit. She states that her right foot wound had burning pain to it with the placement of Apligraf to this area. She has been doing Humphrey over this area. She denies signs of infection including increased warmth, erythema or purulent drainage. 11/1; 3-week follow-up. The patient fortunately did not have [name] total contact cast or an Apligraf and on the left foot. She has been using Keystone ABD pads and kerlix and her own running shoes She arrives in clinic today with thick callus and [name] very poor surface on the left foot on the right nonviable skin subcutaneous tissue and [name] deep probing hole. 11/15; patient missed her last clinic appointment. She states she has not been dressing the wound beds for the past 2 weeks. She states that at she had [name] new roommate but is now going back to live with her mother. Apparently its been [name] distracting 2 weeks. Patient currently denies signs of infection. 11/22; patient presents for follow-up. She states she has been using Keystone antibiotic and Dakin's wet-to-dry dressings to the wound beds. She is agreeable for  cast placement today. We had ordered Apligraf however this has not been received by our facility. 11/29; Patient had [name] total contact cast placed at last clinic visit and she tolerated this well. We were using silver alginate under the cast. Patient's been using Keystone antibiotic with Aquacel to the right plantar foot wound. She has no issues or complaints today. Apligraf is available for placement today. Patient would like to proceed with this. 12/6; patient presents for follow-up. She had Apligraf placed in standard fashion last clinic visit under the total contact cast to the left lower extremity. She has been using Keystone antibiotic and Aquacel to the right plantar foot wound. She has no issues or complaints today. 12/13; patient presents for follow-up. She has finished 5 Apligraf  placements. Was told she would not qualify for more. We have been doing [name] total contact cast to the left lower extremity. She has been using Keystone antibiotic and Aquacel to the right plantar foot wound. She has no issues or complaints today. 12/20; patient presents for follow-up. We have been using Hydrofera Blue with Keystone antibiotic under [name] total contact cast of the left lower extremity. She reports using Keystone antibiotic and silver alginate to the right heel wound. She has no issues or complaints today. 12/27; patient presents with [name] healthy wound on the left midfoot. We have Apligraf to apply that to that more also using [name] total contact cast. On the right we are using Keystone and silver alginate. She is offloading the right heel with [name] surgical shoealthough by her admission she is on her feet quite [name] bit 1/3; patient presents for follow-up. Apligraf was placed to the wound beds last clinic visit. She was placed in [name] total contact cast to the left lower extremity. She declines [name] total contact cast today. She states that her mother is in the hospital and she cannot adequately get around with the cast  on. 1/10; patient presents for follow-up. She declined the total contact cast at last clinic visit. Both wounds have declined in appearance. She states that she has been on her feet and not offloading the wound beds. She currently denies signs of infection. KALIANNA, VERBEKE [name] ([id number]) [id number] Page 18 of 21 1/17; patient presents for follow-up. She had the total contact cast along with Apligraf placed last week to the left lower extremity. She tolerated this well. She has been using Aquacel Ag and Keystone antibiotic to the right heel wound. She currently denies signs of infection. She has no issues or complaints today. 1/24; patient presents for follow-up. We have been using Apligraf to the left foot wound along with [name] total contact cast. She has done well with this. T the right o heel wound she has been using Aquacel Ag and Keystone antibiotic ointment. She has no issues or complaints today. She denies signs of infection. Objective Constitutional Vitals Time Taken: 11:18 AM, Height: 69 in, Weight: 178 lbs, BMI: 26.3, Temperature: 98.1 F, Pulse: 60 bpm, Respiratory Rate: 16 breaths/min, Blood Pressure: 116/75 mmHg. General Notes: Right foot: T the plantar heel there is an incision site with increased depth, No probing to bone. T the opening there is Nonviable tissue and o o granulation tissue. Left foot: T the medial aspect there is an open wound with granulation tissue and nonviable tissue with surrounding callus. No overt signs of o infection to any of the wound beds. Integumentary (Hair, Skin) Wound #11 status is Open. Original cause of wound was Surgical Injury. The date acquired was: 12/01/2021. The wound has been in treatment 35 weeks. The wound is located on the [place]. The wound measures 0.5cm length x 0.7cm width x 0.7cm depth; 0.275cm^2 area and 0.192cm^3 volume. There is Fat Layer (Subcutaneous Tissue) exposed. There is [name] medium amount of  serosanguineous drainage noted. The wound margin is distinct with the outline attached to the wound base. There is medium (34-66%) pale granulation within the wound bed. There is [name] medium (34-66%) amount of necrotic tissue within the wound bed. Wound #12 status is Open. Original cause of wound was Pressure Injury. The date acquired was: 03/16/2020. The wound has been in treatment 35 weeks. The wound is located on the [place]. The wound measures 1.4cm length x 1.3cm width x 0.1cm depth;  1.429cm^2 area and 0.143cm^3 volume. There is Fat Layer (Subcutaneous Tissue) exposed. There is [name] medium amount of serous drainage noted. The wound margin is flat and intact. There is small (1- 33%) red, pink granulation within the wound bed. There is [name] large (67-100%) amount of necrotic tissue within the wound bed including Adherent Slough. 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 I debrided nonviable tissue and Apligraf was placed in standard fashion. We continue the total contact cast. The right foot wound is stable. I debrided nonviable tissue here and recommended continuing with Aquacel Ag and Keystone antibiotic ointment. Continue aggressive offloading with surgical shoe and peg assist. Follow-up in 1 week. Procedures Wound #11 Pre-procedure diagnosis of Wound #11 is an Open Surgical Wound located on the Right,Plantar Foot . There was [name] Excisional Skin/Subcutaneous Tissue Debridement with [name] total area of 1 sq cm performed by Kalman Shan, MD. With the following instrument(s): Curette to remove Viable and  Non-Viable tissue/material. Material removed includes Callus, Subcutaneous Tissue, and Slough. [name] time out was conducted at 11:40, prior to the start of the procedure. [name] Minimum amount of bleeding was controlled with Pressure. The procedure was tolerated well. Post Debridement Measurements: 0.5cm length x 0.7cm width x 0.7cm depth; 0.192cm^3 volume. Character of Wound/Ulcer Post Debridement is stable. Post procedure Diagnosis Wound #11: Same as Pre-Procedure Wound #12 Pre-procedure diagnosis of Wound #12 is [name] Diabetic Wound/Ulcer of the Lower Extremity located on the Left,Medial,Plantar Foot .Severity of Tissue Pre Debridement is: Fat layer exposed. There was [name] Excisional Skin/Subcutaneous Tissue Debridement with [name] total area of 4 sq cm performed by Kalman Shan, MD. With the following instrument(s): Curette to remove Viable and Non-Viable tissue/material. Material removed includes Subcutaneous Tissue and Slough and. [name] time out was conducted at 11:38, prior to the start of the procedure. [name] Minimum amount of bleeding was controlled with Pressure. The procedure was tolerated well. Post Debridement Measurements: 1.4cm length x 1.3cm width x 0.1cm depth; 0.143cm^3 volume. Character of Wound/Ulcer Post Debridement is stable. Severity of Tissue Post Debridement is: Fat layer exposed. Post procedure Diagnosis Wound #12: Same as Pre-Procedure Pre-procedure diagnosis of Wound #12 is [name] Diabetic Wound/Ulcer of the Lower Extremity located on the Left,Medial,Plantar Foot. [name] skin graft procedure using [name] bioengineered skin substitute/cellular or tissue based product was performed by Kalman Shan, MD with the following instrument(s): Blade and Scissors. Apligraf was applied and secured with Steri-Strips. 2 sq cm of product was utilized and 42 sq cm was wasted due to wound size. Post Application, mepitel one NAMYA, VOGES [name] ([id number]) [id number] Page 19 of 21 was applied. [name]  Time Out was conducted at 11:42, prior to the start of the procedure. The procedure was tolerated well. Post procedure Diagnosis Wound #12: Same as Pre-Procedure . Pre-procedure diagnosis of Wound #12 is [name] Diabetic Wound/Ulcer of the Lower Extremity located on the Left,Medial,Plantar Foot . There was [name] T Contact otal Cast Procedure by Kalman Shan, MD. Post procedure Diagnosis Wound #12: Same as Pre-Procedure Plan Follow-up Appointments: Return Appointment in 1 week. Nurse Visit as needed Bathing/ Shower/ Hygiene: May shower with wound dressing protected with water repellent cover or cast protector. No  tub bath. Anesthetic (Use 'Patient Medications' Section for Anesthetic Order Entry): Lidocaine applied to wound bed Cellular or Tissue Based Products: Cellular or Tissue Based Product Type: - Apligraf applied Cellular or Tissue Based Product applied to wound bed; including contact layer, fixation with steri-strips, dry gauze and cover dressing. (DO NOT REMOVE). Edema Control - Lymphedema / Segmental Compressive Device / Other: Elevate, Exercise Daily and Avoid Standing for Long Periods of Time. Elevate legs to the level of the heart and pump ankles as often as possible Elevate leg(s) parallel to the floor when sitting. Off-Loading: T Contact Cast to Left Lower Extremity otal Open toe surgical shoe - Heel off loader - Right foot Other: - keep pressure off of feet. Additional Orders / Instructions: Follow Nutritious Diet and Increase Protein Intake Other: - Stay off of foot, wheelchair only Medications-Please add to medication list.: Wound #11 Right,Plantar Foot: Keystone Compound WOUND #11: - Foot Wound Laterality: Plantar, Right Cleanser: Wound Cleanser 1 x Per Day/30 Days Discharge Instructions: Wash your hands with soap and water. Remove old dressing, discard into plastic bag and place into trash. Cleanse the wound with Wound Cleanser prior to applying [name] clean dressing using  gauze sponges, not tissues or cotton balls. Do not scrub or use excessive force. Pat dry using gauze sponges, not tissue or cotton balls. Topical: keystone gel 1 x Per Day/30 Days Prim Dressing: Aquacel Extra Hydrofiber Dressing, 2x2 (in/in) 1 x Per Day/30 Days ary Secondary Dressing: ABD Pad 5x9 (in/in) (Generic) 1 x Per Day/30 Days Discharge Instructions: Cover with ABD pad Secured With: Medipore T - 33M Medipore H Soft Cloth Surgical T ape ape, 2x2 (in/yd) 1 x Per Day/30 Days Secured With: [employer] Sterile or Non-Sterile 6-ply 4.5x4 (yd/yd) 1 x Per Day/30 Days Discharge Instructions: Apply Kerlix as directed WOUND #12: - Foot Wound Laterality: Plantar, Left, Medial Cleanser: Wound Cleanser 1 x Per Day/30 Days Discharge Instructions: Wash your hands with soap and water. Remove old dressing, discard into plastic bag and place into trash. Cleanse the wound with Wound Cleanser prior to applying [name] clean dressing using gauze sponges, not tissues or cotton balls. Do not scrub or use excessive force. Pat dry using gauze sponges, not tissue or cotton balls. Peri-Wound Care: Triamcinolone Acetonide Cream, 0.1%, 15 (g) tube 1 x Per Day/30 Days Discharge Instructions: Apply as directed to irritated areas. Secondary Dressing: ABD Pad 5x9 (in/in) 1 x Per Day/30 Days Discharge Instructions: Cover with ABD pad Secondary Dressing: Foam Dressing, 4x4 (in/in) 1 x Per Day/30 Days Discharge Instructions: used for padding ankle and forefoot. 1. In office sharp debridement 2. Apligraf placed in standard fashion 3. T contact cast placed in standard fashion otal 4. Aquacel Ag and Keystone antibiotic ointmentooright foot wound 5. Follow-up in 1 week Electronic Signature(s) Signed: 11/16/2022 3:12:59 PM By: Kalman Shan DO Entered By: Kalman Shan on 11/16/2022 12:50:14 [name] ([id number]) [id number] Page 20 of  21 -------------------------------------------------------------------------------- Total Contact Cast Details Patient Name: Date of Service: ADITRI, LOUISCHARLES 11/16/2022 11:00 [name]: [id number] Patient Account Number: [id number] Date of Birth/Sex: Treating RN: [birth year] (47 y.o. [name], [name] Primary Care Provider: Raelene Bott Other Clinician: Massie Kluver Referring Provider: Treating Provider/Extender: Eddie North in Treatment: 16 T Contact Cast Applied for Wound Assessment: otal Wound #12 Left,Medial,Plantar Foot Performed By: Physician Kalman Shan, MD Post Procedure Diagnosis Same as Pre-procedure Electronic Signature(s) Signed: 11/16/2022 3:12:59 PM By: Kalman Shan DO Signed: 11/16/2022 5:30:54 PM By:  Massie Kluver Entered By: Massie Kluver on 11/16/2022 11:39:38 -------------------------------------------------------------------------------- SuperBill Details Patient Name: Date of Service: EMONEE, WINKOWSKI 11/16/2022 Medical Record Number: [id number] Patient Account Number: [id number] Date of Birth/Sex: Treating RN: [birth year] (47 y.o. [name], [name] Primary Care Provider: Raelene Bott Other Clinician: Massie Kluver Referring Provider: Treating Provider/Extender: Eddie North in Treatment: 35 Diagnosis Coding ICD-10 Codes Code Description [phone] Non-pressure chronic ulcer of other part of left foot with other specified severity 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.621 Type 2 diabetes mellitus with foot ulcer E11.42 Type 2 diabetes mellitus with diabetic polyneuropathy L97.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 of skin Facility Procedures : CPT4 Code: [id number] Description: [phone] (Facility Use Only) Apligraf 44 SQ CM Modifier: Quantity: 65 : Paolucci, K CPT4 Code: [id number] RISTI [name]  ([id number] Description: 32440 - SKIN SUB GRAFT FACE/NK/HF/G ICD-10 Diagnosis Description 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 E11.621 Type 2 diabetes mellitus with foot ulcer 2) [id number] Modifier: ity [email or web address] Quantity: 1 17.pdf Page 21 of 21 : CPT4 Code: [id number] Description: 43329 - DEB SUBQ TISSUE 20 SQ CM/< ICD-10 Diagnosis Description 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.621 Type 2 diabetes mellitus with foot ulcer Modifier: 1 Quantity: Physician Procedures : CPT4 Code Description Modifier [id number] 60630 - WC PHYS SKIN SUB GRAFT FACE/NK/HF/G ICD-10 Diagnosis Description 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ity E11.621 Type 2 diabetes mellitus with foot ulcer Quantity: 1 : [id number] 23557 - WC PHYS SUBQ TISS 20 SQ CM ICD-10 Diagnosis Description 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.621 Type 2 diabetes mellitus with foot ulcer Quantity: 1 Electronic Signature(s) Signed: 11/16/2022 3:12:59 PM By: Kalman Shan DO Entered By: Kalman Shan on 11/16/2022 12:50:34

## 2022-11-17 NOTE — Progress Notes (Signed)
[name] ([id number]) [id number] Page 1 of 11 Visit Report for 11/16/2022 Arrival Information Details Patient Name: Date of Service: [name], [name] 11/16/2022 11:00 [name]: [id number] Patient Account Number: [id number] Date of Birth/Sex: Treating RN: [birth year] (47 y.o. [name] Primary Care Tawni Melkonian: Raelene Bott Other Clinician: Massie Kluver Referring Jaselle Pryer: Treating Isayah Ignasiak/Extender: Eddie North in Treatment: 51 Visit Information History Since Last Visit All ordered tests and consults were completed: No Patient Arrived: Wheel Chair Added or deleted any medications: No Arrival Time: 11:08 Any new allergies or adverse reactions: No Transfer Assistance: EasyPivot Patient Lift Had [name] fall or experienced change in No Patient Identification Verified: Yes activities of daily living that may affect Secondary Verification Process Completed: Yes risk of falls: Patient Requires Transmission-Based Precautions: No Signs or symptoms of abuse/neglect since last visito No Patient Has Alerts: No Hospitalized since last visit: No Implantable device outside of the clinic excluding No cellular tissue based products placed in the center since last visit: Has Dressing in Place as Prescribed: Yes Has Compression in Place as Prescribed: Yes Has Footwear/Offloading in Place as Prescribed: Yes Left: T Contact Cast otal Wedge Shoe Pain Present Now: Yes Electronic Signature(s) Signed: 11/16/2022 5:30:54 PM By: Massie Kluver Entered By: Massie Kluver on 11/16/2022 11:16:00 -------------------------------------------------------------------------------- Clinic Level of Care Assessment Details Patient Name: Date of Service: [name], [name] 11/16/2022 11:00 [place] [name] Record Number: [id number] Patient Account Number: [id number] Date of Birth/Sex: Treating RN: 1976/08/27 (47 y.o. [name] Primary  Care Cherie Lasalle: Raelene Bott Other Clinician: Massie Kluver Referring Brecken Dewoody: Treating Lorry Anastasi/Extender: Eddie North in Treatment: 35 Clinic Level of Care Assessment Items TOOL 1 Quantity Score []  - 0 Use when EandM and Procedure is performed on INITIAL visit TRAMEKA, DOROUGH [name] ([id number]) [id number] Page 2 of 11 ASSESSMENTS - Nursing Assessment / Reassessment []  - 0 General Physical Exam (combine w/ comprehensive assessment (listed just below) when performed on new pt. evals) []  - 0 Comprehensive Assessment (HX, ROS, Risk Assessments, Wounds Hx, etc.) ASSESSMENTS - Wound and Skin Assessment / Reassessment []  - 0 Dermatologic / Skin Assessment (not related to wound area) ASSESSMENTS - Ostomy and/or Continence Assessment and Care []  - 0 Incontinence Assessment and Management []  - 0 Ostomy Care Assessment and Management (repouching, etc.) PROCESS - Coordination of Care []  - 0 Simple Patient / Family Education for ongoing care []  - 0 Complex (extensive) Patient / Family Education for ongoing care []  - 0 Staff obtains Programmer, systems, Records, T Results / Process Orders est []  - 0 Staff telephones HHA, Nursing Homes / Clarify orders / etc []  - 0 Routine Transfer to another Facility (non-emergent condition) []  - 0 Routine Hospital Admission (non-emergent condition) []  - 0 New Admissions / Biomedical engineer / Ordering NPWT Apligraf, etc. , []  - 0 Emergency Hospital Admission (emergent condition) PROCESS - Special Needs []  - 0 Pediatric / Minor Patient Management []  - 0 Isolation Patient Management []  - 0 Hearing / Language / Visual special needs []  - 0 Assessment of Community assistance (transportation, D/C planning, etc.) []  - 0 Additional assistance / Altered mentation []  - 0 Support Surface(s) Assessment (bed, cushion, seat, etc.) INTERVENTIONS - Miscellaneous []  - 0 External ear exam []  - 0 Patient  Transfer (multiple staff / Civil Service fast streamer / Similar devices) []  - 0 Simple Staple / Suture removal (25 or less) []  - 0 Complex Staple / Suture removal (26 or more) []  - 0 Hypo/Hyperglycemic  Management (do not check if billed separately) []  - 0 Ankle / Brachial Index (ABI) - do not check if billed separately Has the patient been seen at the hospital within the last three years: Yes Total Score: 0 Level Of Care: ____ Electronic Signature(s) Signed: 11/16/2022 5:30:54 PM By: Massie Kluver Entered By: Massie Kluver on 11/16/2022 12:18:43 -------------------------------------------------------------------------------- Encounter Discharge Information Details Patient Name: Date of Service: [name], [name] 11/16/2022 11:00 [place] Record Number: [id number] Patient Account Number: [id number] ORTHA, METTS [name] ([id number]) [id number] Page 3 of 11 Date of Birth/Sex: Treating RN: July 15, 1976 (47 y.o. [name] Primary Care Shahed Yeoman: Other Clinician: Glade Nurse Referring Lindalou Soltis: Treating Marlon Suleiman/Extender: Eddie North in Treatment: 7 Encounter Discharge Information Items Post Procedure Vitals Discharge Condition: Stable Temperature (F): 98.1 Ambulatory Status: Wheelchair Pulse (bpm): 60 Discharge Destination: Home Respiratory Rate (breaths/min): 16 Transportation: Private Auto Blood Pressure (mmHg): 116/75 Accompanied By: mother Schedule Follow-up Appointment: Yes Clinical Summary of Care: Electronic Signature(s) Signed: 11/16/2022 5:30:54 PM By: Massie Kluver Entered By: Massie Kluver on 11/16/2022 12:20:43 -------------------------------------------------------------------------------- Lower Extremity Assessment Details Patient Name: Date of Service: [name], [name] 11/16/2022 11:00 [name]: [id number] Patient Account Number: [id number] Date of Birth/Sex: Treating RN: [birth year] (47  y.o. [name] Primary Care Cayle Cordoba: Raelene Bott Other Clinician: Massie Kluver Referring [name]: Treating Sophee Mckimmy/Extender: Linna Hoff Weeks in Treatment: 35 Edema Assessment Assessed: [Left: No] [Right: No] [Left: Edema] [Right: :] Calf Left: Right: Point of Measurement: 30 cm From Medial Instep Ankle Left: Right: Point of Measurement: 9 cm From Medial Instep Vascular Assessment Pulses: Dorsalis Pedis Palpable: [Left:Yes] Electronic Signature(s) Signed: 11/16/2022 5:30:54 PM By: Massie Kluver Signed: 11/16/2022 5:47:34 PM By: Gretta Cool, BSN, RN, CWS, [name] RN, BSN Entered By: Massie Kluver on 11/16/2022 11:32:39 [name] ([id number]) [id number] Page 4 of 11 -------------------------------------------------------------------------------- Multi Wound Chart Details Patient Name: Date of Service: [name], [name] 11/16/2022 11:00 [name]: [id number] Patient Account Number: [id number] Date of Birth/Sex: Treating RN: 1976-01-13 (47 y.o. 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: Extremity Chronic sinus problems/congestion, Chronic sinus problems/congestion, N/[name] Comorbid History: Middle ear problems, Anemia, Chronic Middle ear problems, Anemia, Chronic Obstructive Pulmonary Disease Obstructive Pulmonary Disease (COPD), Congestive Heart Failure, (COPD), Congestive  Heart Failure, Type II Diabetes, End Stage Renal Type II Diabetes, End Stage Renal Disease, History of pressure wounds, Disease, History of pressure wounds, Neuropathy Neuropathy 12/01/2021 03/16/2020 N/[name] Date Acquired: 35 35 N/[name] Weeks of Treatment: Open Open N/[name] Wound Status: No No N/[name] Wound Recurrence: 0.5x0.7x0.7 1.4x1.3x0.1 N/[name] Measurements L x W x D (cm) 0.275 1.429 N/[name] [name] (cm) : rea 0.192 0.143 N/[name] Volume (cm) : 85.10% 69.70% N/[name] % Reduction in Area: 88.40% 84.80% N/[name] % Reduction in Volume: Full Thickness Without Exposed Grade 3 N/[name] Classification: Support Structures Medium Medium N/[name] Exudate Amount: Serosanguineous Serous N/[name] Exudate Type: red, brown amber N/[name] Exudate Color: Distinct, outline attached Flat and Intact N/[name] Wound Margin: Medium (34-66%) Small (1-33%) N/[name] Granulation Amount: Pale Red, Pink N/[name] Granulation Quality: Medium (34-66%) Large (67-100%) N/[name]  Necrotic Amount: Fat Layer (Subcutaneous Tissue): Yes Fat Layer (Subcutaneous Tissue): Yes N/[name] Exposed Structures: Fascia: No Fascia: No Tendon: No Tendon: No Muscle: No Muscle: No Joint: No Joint: No Bone: No Bone: No Medium (34-66%) None N/[name] Epithelialization: Treatment Notes CAMAURI, FLEECE [name] ([id number]) [id number] Page 5 of 11 Electronic Signature(s) Signed: 11/16/2022 5:30:54 PM By: Massie Kluver Entered By: Massie Kluver on 11/16/2022 11:32:44 -------------------------------------------------------------------------------- [place] Details Patient Name: Date of Service: [name] 11/16/2022 11:00 [name]: [id number] Patient Account Number: [id number] Date of Birth/Sex: Treating RN: Aug 31, 1976 (47 y.o. [name], [name] Primary Care Raneisha Bress: Raelene Bott Other Clinician: Massie Kluver Referring Lynessa Almanzar: Treating Rosamaria Donn/Extender: Eddie North in Treatment: 106 Active Inactive Abuse  / Safety / Falls / Self Care Management Nursing Diagnoses: History of Falls Potential for falls Potential for injury related to falls Goals: Patient will not develop complications from immobility Date Initiated: 03/17/2022 Date Inactivated: 04/20/2022 Target Resolution Date: 03/16/2022 Goal Status: Met Patient/caregiver will verbalize understanding of skin care regimen Date Initiated: 03/17/2022 Date Inactivated: 09/28/2022 Target Resolution Date: 03/16/2022 Goal Status: Met Patient/caregiver will verbalize/demonstrate measure taken to improve self care Date Initiated: 03/17/2022 Target Resolution Date: 03/16/2022 Goal Status: Active Interventions: Assess fall risk on admission and as needed Provide education on basic hygiene Provide education on personal and home safety Notes: Necrotic Tissue Nursing Diagnoses: Impaired tissue integrity related to necrotic/devitalized tissue Knowledge deficit related to management of necrotic/devitalized tissue Goals: Necrotic/devitalized tissue will be minimized in the wound bed Date Initiated: 03/17/2022 Target Resolution Date: 10/26/2022 Goal Status: Active Patient/caregiver will verbalize understanding of reason and process for debridement of necrotic tissue Date Initiated: 03/17/2022 Date Inactivated: 09/28/2022 Target Resolution Date: 03/16/2022 Goal Status: Met Interventions: Provide education on necrotic tissue and debridement process Treatment Activities: Apply topical anesthetic as ordered : 03/16/2022 Excisional debridement : 03/16/2022 JANESA, DOCKERY [name] ([id number]) [id number] Page 6 of 11 Notes: Osteomyelitis Nursing Diagnoses: Infection: osteomyelitis Knowledge deficit related to disease process and management Goals: Patient/caregiver will verbalize understanding of disease process and disease management Date Initiated: 03/17/2022 Target Resolution Date: 03/16/2022 Goal Status: Active Patient's  osteomyelitis will resolve Date Initiated: 03/17/2022 Target Resolution Date: 10/26/2022 Goal Status: Active Signs and symptoms for osteomyelitis will be recognized and promptly addressed Date Initiated: 03/17/2022 Target Resolution Date: 10/26/2022 Goal Status: Active Interventions: Assess for signs and symptoms of osteomyelitis resolution every visit Provide education on osteomyelitis Treatment Activities: Systemic antibiotics : 03/16/2022 Notes: Patient on antibiotics. Pressure Nursing Diagnoses: Knowledge deficit related to management of pressures ulcers Potential for impaired tissue integrity related to pressure, friction, moisture, and shear Goals: Patient will remain free from development of additional pressure ulcers Date Initiated: 11/02/2022 Target Resolution Date: 11/09/2022 Goal Status: Active Patient/caregiver will verbalize risk factors for pressure ulcer development Date Initiated: 11/02/2022 Target Resolution Date: 11/02/2022 Goal Status: Active Patient/caregiver will verbalize understanding of pressure ulcer management Date Initiated: 11/02/2022 Target Resolution Date: 11/02/2022 Goal Status: Active Interventions: Assess: immobility, friction, shearing, incontinence upon admission and as needed Provide education on pressure ulcers Notes: Electronic Signature(s) Signed: 11/16/2022 5:30:54 PM By: Massie Kluver Signed: 11/16/2022 5:47:34 PM By: Gretta Cool, BSN, RN, CWS, [name] RN, BSN Entered By: Massie Kluver on 11/16/2022 12:19:47 -------------------------------------------------------------------------------- Pain Assessment Details Patient Name: Date of Service: [name] 11/16/2022 11:00 [place] Record Number: [id number] Patient Account Number: [id number] Date of Birth/Sex: Treating RN: [birth year] (47 y.o. Eulogia, [place], [name] ([id number]) [phone].pdf Page 7 of 11 Primary Care Lyzbeth Genrich:  Raelene Bott Other Clinician:  Massie Kluver Referring Makinzy Cleere: Treating Kert Shackett/Extender: Eddie North in Treatment: 52 Active Problems Location of Pain Severity and Description of Pain Patient Has Paino Yes Site Locations Pain Location: Pain in Ulcers Duration of the Pain. Constant / Intermittento Constant Rate the pain. Current Pain Level: 2 Character of Pain Describe the Pain: Burning, Throbbing Pain Management and Medication Current Pain Management: Medication: Yes Cold Application: No Rest: No Massage: No Activity: No T.E.N.S.: No Heat Application: No Leg drop or elevation: No Is the Current Pain Management Adequate: Inadequate How does your wound impact your activities of daily livingo Sleep: No Bathing: No Appetite: No Relationship With Others: No Bladder Continence: No Emotions: No Bowel Continence: No Work: No Toileting: No Drive: No Dressing: No Hobbies: No Electronic Signature(s) Signed: 11/16/2022 5:30:54 PM By: Massie Kluver Signed: 11/16/2022 5:47:34 PM By: Gretta Cool, BSN, RN, CWS, [name] RN, BSN Entered By: Massie Kluver on 11/16/2022 11:18:56 -------------------------------------------------------------------------------- Patient/Caregiver Education Details Patient Name: Date of Service: [name], [name] 1/24/2024andnbsp11:00 [place] Record Number: [id number] Patient Account Number: [id number] Date of Birth/Gender: Treating RN: 1975-11-02 (47 y.o. [name] Primary Care Physician: Raelene Bott Other Clinician: Massie Kluver Referring Physician: Treating Physician/Extender: Eddie North in Treatment: [address], [place] ([id number]) [id number] Page 8 of 11 Education Assessment Education Provided To: Patient Education Topics Provided Wound/Skin Impairment: Handouts: Other: continue wound care as directed Methods: Explain/Verbal Responses: State content correctly Electronic  Signature(s) Signed: 11/16/2022 5:30:54 PM By: Massie Kluver Entered By: Massie Kluver on 11/16/2022 12:19:39 -------------------------------------------------------------------------------- Wound Assessment Details Patient Name: Date of Service: [name] 11/16/2022 11:00 [name]: [id number] Patient Account Number: [id number] Date of Birth/Sex: Treating RN: 1976-01-11 (47 y.o. [name], [name] Primary Care Contina Strain: Raelene Bott Other Clinician: Massie Kluver Referring Chamaine Stankus: Treating Azreal Stthomas/Extender: Linna Hoff Weeks in Treatment: 35 Wound Status Wound Number: 11 Primary Open Surgical Wound Etiology: Wound Location: Right, Plantar Foot Wound Open Wounding Event: Surgical Injury Status: Date Acquired: 12/01/2021 Comorbid Chronic sinus problems/congestion, Middle ear problems, Anemia, Weeks Of Treatment: 35 History: Chronic Obstructive Pulmonary Disease (COPD), Congestive Heart Clustered Wound: No Failure, Type II Diabetes, End Stage Renal Disease, History of pressure wounds, Neuropathy Photos Wound Measurements Length: (cm) 0.5 Width: (cm) 0.7 Depth: (cm) 0.7 Area: (cm) 0.275 Volume: (cm) 0.192 % Reduction in Area: 85.1% % Reduction in Volume: 88.4% Epithelialization: Medium (34-66%) Wound Description Classification: Full Thickness Without Exposed Support Structures Wound Margin: Distinct, outline attached Detloff, Juri [name] ([id number]) Exudate Amount: Medium Exudate Type: Serosanguineous Exudate Color: red, brown Foul Odor After Cleansing: No Slough/Fibrino Yes [id number] Page 9 of 11 Wound Bed Granulation Amount: Medium (34-66%) Exposed Structure Granulation Quality: Pale Fascia Exposed: No Necrotic Amount: Medium (34-66%) Fat Layer (Subcutaneous Tissue) Exposed: Yes Tendon Exposed: No Muscle Exposed: No Joint Exposed: No Bone Exposed: No Treatment Notes Wound #11 (Foot) Wound  Laterality: Plantar, Right Cleanser Wound Cleanser Discharge Instruction: Wash your hands with soap and water. Remove old dressing, discard into plastic bag and place into trash. Cleanse the wound with Wound Cleanser prior to applying [name] clean dressing using gauze sponges, not tissues or cotton balls. Do not scrub or use excessive force. Pat dry using gauze sponges, not tissue or cotton balls. Peri-Wound Care Topical keystone gel Primary Dressing Aquacel Extra Hydrofiber Dressing, 2x2 (in/in) Secondary Dressing ABD Pad 5x9 (in/in) Discharge Instruction: Cover with ABD pad Secured With Medipore T - 25M Medipore H Soft Cloth  Surgical T ape ape, 2x2 (in/yd) Kerlix Roll Sterile or Non-Sterile 6-ply 4.5x4 (yd/yd) Discharge Instruction: Apply Kerlix as directed Compression Wrap Compression Stockings Add-Ons Electronic Signature(s) Signed: 11/16/2022 5:30:54 PM By: Massie Kluver Signed: 11/16/2022 5:47:34 PM By: Gretta Cool, BSN, RN, CWS, [name] RN, BSN Entered By: Massie Kluver on 11/16/2022 11:31:35 -------------------------------------------------------------------------------- Wound Assessment Details Patient Name: Date of Service: [name] 11/16/2022 11:00 [name]: [id number] Patient Account Number: [id number] Date of Birth/Sex: Treating RN: 21-Mar-1976 (47 y.o. [name] Primary Care Josede Cicero: Raelene Bott Other Clinician: Massie Kluver Referring Acel Natzke: Treating Yolande Skoda/Extender: Linna Hoff Weeks in Treatment: 35 Wound Status Wound Number: 12 Primary Diabetic Wound/Ulcer of the Lower Extremity Etiology: Wound Location: Left, Medial, Plantar Foot Wound Open Butrum, Andee [name] ([id number]) [id number] Page 10 of 11 Wound Open Wounding Event: Pressure Injury Status: Date Acquired: 03/16/2020 Comorbid Chronic sinus problems/congestion, Middle ear problems, Anemia, Weeks Of Treatment: 35 History: Chronic  Obstructive Pulmonary Disease (COPD), Congestive Heart Clustered Wound: No Failure, Type II Diabetes, End Stage Renal Disease, History of pressure wounds, Neuropathy Photos Wound Measurements Length: (cm) 1.4 Width: (cm) 1.3 Depth: (cm) 0.1 Area: (cm) 1.429 Volume: (cm) 0.143 % Reduction in Area: 69.7% % Reduction in Volume: 84.8% Epithelialization: None Wound Description Classification: Grade 3 Wound Margin: Flat and Intact Exudate Amount: Medium Exudate Type: Serous Exudate Color: amber Foul Odor After Cleansing: No Slough/Fibrino No Wound Bed Granulation Amount: Small (1-33%) Exposed Structure Granulation Quality: Red, Pink Fascia Exposed: No Necrotic Amount: Large (67-100%) Fat Layer (Subcutaneous Tissue) Exposed: Yes Necrotic Quality: Adherent Slough Tendon Exposed: No Muscle Exposed: No Joint Exposed: No Bone Exposed: No Treatment Notes Wound #12 (Foot) Wound Laterality: Plantar, Left, Medial Cleanser Wound Cleanser Discharge Instruction: Wash your hands with soap and water. Remove old dressing, discard into plastic bag and place into trash. Cleanse the wound with Wound Cleanser prior to applying [name] clean dressing using gauze sponges, not tissues or cotton balls. Do not scrub or use excessive force. Pat dry using gauze sponges, not tissue or cotton balls. Peri-Wound Care Triamcinolone Acetonide Cream, 0.1%, 15 (g) tube Discharge Instruction: Apply as directed to irritated areas. Topical Primary Dressing Secondary Dressing ABD Pad 5x9 (in/in) Discharge Instruction: Cover with ABD pad Foam Dressing, 4x4 (in/in) Discharge Instruction: used for padding ankle and forefoot. Secured With Compression Wrap Compression Stockings Add-Ons SIRA, ADSIT [name] ([id number]) [id number] Page 11 of 11 Electronic Signature(s) Signed: 11/16/2022 5:30:54 PM By: Massie Kluver Signed: 11/16/2022 5:47:34 PM By: Gretta Cool, BSN, RN, CWS, [name] RN, BSN Entered By:  Massie Kluver on 11/16/2022 11:32:08 -------------------------------------------------------------------------------- Vitals Details Patient Name: Date of Service: [name], [name] 11/16/2022 11:00 [name]: [id number] Patient Account Number: [id number] Date of Birth/Sex: Treating RN: 06-03-1976 (47 y.o. [name], [name] Primary Care Reinhart Saulters: Raelene Bott Other Clinician: Massie Kluver Referring Natacia Chaisson: Treating Aino Heckert/Extender: Eddie North in Treatment: 35 Vital Signs Time Taken: 11:18 Temperature (F): 98.1 Height (in): 69 Pulse (bpm): 60 Weight (lbs): 178 Respiratory Rate (breaths/min): 16 Body Mass Index (BMI): 26.3 Blood Pressure (mmHg): 116/75 Reference Range: 80 - 120 mg / dl Electronic Signature(s) Signed: 11/16/2022 5:30:54 PM By: Massie Kluver Entered By: Massie Kluver on 11/16/2022 11:18:52

## 2022-11-23 ENCOUNTER — Encounter (HOSPITAL_BASED_OUTPATIENT_CLINIC_OR_DEPARTMENT_OTHER): Payer: Medicaid Other | Admitting: Internal Medicine

## 2022-11-23 DIAGNOSIS — L97512 Non-pressure chronic ulcer of other part of right foot with fat layer exposed: Secondary | ICD-10-CM | POA: Diagnosis not present

## 2022-11-23 DIAGNOSIS — L97528 Non-pressure chronic ulcer of other part of left foot with other specified severity: Secondary | ICD-10-CM | POA: Diagnosis not present

## 2022-11-23 DIAGNOSIS — E11621 Type 2 diabetes mellitus with foot ulcer: Secondary | ICD-10-CM | POA: Diagnosis not present

## 2022-11-24 NOTE — Progress Notes (Signed)
[name], [name] ([id number]) [id number] Page 1 of 21 Visit Report for 11/23/2022 Chief Complaint Document Details Patient Name: Date of Service: [name] 11/23/2022 11:00 [place] Record Number: [id number] Patient Account Number: [id number] Date of Birth/Sex: Treating RN: [birth year] (47 y.o. [name] Primary Care Provider: Raelene [name] Other Clinician: Massie [name] Referring Provider: Treating Provider/Extender: [name] in Treatment: 36 Information Obtained from: Patient Chief Complaint 03/19/2021; patient referred by Dr. Luana Shu who has been looking after her left foot for quite [name] period of time for review of [name] nonhealing area in the left midfoot 03/12/2022; bilateral feet wounds and right lower extremity wound. Electronic Signature(s) Signed: 11/23/2022 1:38:11 PM By: Heather Shan DO Entered By: [name] on 11/23/2022 11:57:24 -------------------------------------------------------------------------------- Cellular or Tissue Based Product Details Patient Name: Date of Service: [name] 11/23/2022 11:00 [name]: [id number] Patient Account Number: [id number] Date of Birth/Sex: Treating RN: 1976-03-14 (47 y.o. [name] Primary Care Provider: Raelene [name] Other Clinician: Massie [name] Referring Provider: Treating Provider/Extender: [name] in Treatment: 36 Cellular or Tissue Based Product Type Wound #12 Left,Medial,Plantar Foot Applied to: Performed By: Physician Heather Shan, MD Cellular or Tissue Based Product Type: Apligraf Level of Consciousness (Pre-procedure): Awake and Alert Pre-procedure Verification/Time Out Yes - 11:42 Taken: Location: genitalia / hands / feet / multiple digits Wound Size (sq cm): 1.05 Product Size (sq cm): 44 Waste Size (sq cm): 33 Waste Reason: wound size Amount of Product Applied (sq cm):  11 Instrument Used: Blade, Scissors Lot #: [id number] Order #: 4 Expiration Date: 11/30/2022 [name], [name] ([id number]) [id number] Page 2 of 21 Fenestrated: Yes Instrument: Blade Reconstituted: Yes Solution Type: normal saline Solution Amount: 8 mls Lot #: [id number] Solution Expiration Date: 02/21/2024 Secured: Yes Secured With: Steri-Strips Dressing Applied: Yes Primary Dressing: bolster Response to Treatment: Procedure was tolerated well Level of Consciousness (Post- Awake and Alert procedure): Post Procedure Diagnosis Same as Pre-procedure Electronic Signature(s) Signed: 11/23/2022 4:51:29 PM By: [name] Entered By: [name] on 11/23/2022 11:45:06 -------------------------------------------------------------------------------- Debridement Details Patient Name: Date of Service: [name] 11/23/2022 11:00 [name]: [id number] Patient Account Number: [id number] Date of Birth/Sex: Treating RN: 1976-07-31 (47 y.o. [name] Primary Care Provider: Raelene [name] Other Clinician: Massie [name] Referring Provider: Treating Provider/Extender: [name] in Treatment: 36 Debridement Performed for Assessment: Wound #11 Right,Plantar Foot Performed By: Physician Heather Shan, MD Debridement Type: Debridement Level of Consciousness (Pre-procedure): Awake and Alert Pre-procedure Verification/Time Out Yes - 11:37 Taken: Start Time: 11:37 T Area Debrided (L x W): otal 1 (cm) x 1 (cm) = 1 (cm) Tissue and other material debrided: Viable, Non-Viable, Cartilage, Slough, Subcutaneous, Slough Level: Skin/Subcutaneous Tissue/Muscle/Bone Debridement Description: Excisional Instrument: Curette Bleeding: Minimum Hemostasis Achieved: Pressure Response to Treatment: Procedure was tolerated well Level of Consciousness (Post- Awake and Alert procedure): Post Debridement Measurements of  Total Wound Length: (cm) 0.7 Width: (cm) 0.6 Depth: (cm) 0.7 Volume: (cm) 0.231 Character of Wound/Ulcer Post Debridement: Stable Post Procedure Diagnosis Same as Pre-procedure Electronic Signature(s) Signed: 11/23/2022 1:38:11 PM By: [name], [name] ([id number]) [id number] Page 3 of 21 Signed: 11/23/2022 4:51:29 PM By: [name] Signed: 11/23/2022 7:35:08 PM By: [name], BSN, RN, CWS, [name] Entered By: [name] on 11/23/2022 11:40:35 -------------------------------------------------------------------------------- Debridement Details Patient Name: Date of Service: [name] 11/23/2022 11:00 [name]: [id number] Patient Account  Number: [id number] Date of Birth/Sex: Treating RN: Jul 30, 1976 (47 y.o. [name], Heather Primary Care Provider: Raelene [name] Other Clinician: Massie [name] Referring Provider: Treating Provider/Extender: [name] in Treatment: 36 Debridement Performed for Assessment: Wound #12 Left,Medial,Plantar Foot Performed By: Physician Heather Shan, MD Debridement Type: Debridement Severity of Tissue Pre Debridement: Fat layer exposed Level of Consciousness (Pre-procedure): Awake and Alert Pre-procedure Verification/Time Out Yes - 11:39 Taken: Start Time: 11:39 T Area Debrided (L x W): otal 1.5 (cm) x 0.7 (cm) = 1.05 (cm) Tissue and other material debrided: Viable, Non-Viable, Cartilage, Slough, Subcutaneous, Slough Level: Skin/Subcutaneous Tissue/Muscle/Bone Debridement Description: Excisional Instrument: Curette Bleeding: Minimum Hemostasis Achieved: Pressure Response to Treatment: Procedure was tolerated well Level of Consciousness (Post- Awake and Alert procedure): Post Debridement Measurements of Total Wound Length: (cm) 1.5 Width: (cm) 0.7 Depth: (cm) 0.1 Volume: (cm) 0.082 Character of Wound/Ulcer Post Debridement:  Stable Severity of Tissue Post Debridement: Fat layer exposed Post Procedure Diagnosis Same as Pre-procedure Electronic Signature(s) Signed: 11/23/2022 1:38:11 PM By: Heather Shan DO Signed: 11/23/2022 4:51:29 PM By: [name] Signed: 11/23/2022 7:35:08 PM By: [name], BSN, RN, CWS, [name] Entered By: [name] on 11/23/2022 11:41:16 [name] ([id number]) [id number] Page 4 of 21 -------------------------------------------------------------------------------- HPI Details Patient Name: Date of Service: [name], [name] 11/23/2022 11:00 [name]: [id number] Patient Account Number: [id number] Date of Birth/Sex: Treating RN: Nov 29, 1975 (47 y.o. [name] Primary Care Provider: Raelene [name] Other Clinician: Massie [name] Referring Provider: Treating Provider/Extender: [name] in Treatment: 36 History of Present Illness HPI Description: 01/18/18-She is here for initial evaluation of the left great toe ulcer. She is [name] poor historian in regards to timeframe in detail. She states approximately 4 weeks ago she lacerated her toe on something in the house. She followed up with her primary care who placed her on Bactrim and ultimately [name] second dose of Bactrim prior to coming to wound clinic. She states she has been treating the toe with peroxide, Betadine and [name] Band-Aid. She did not check her blood sugar this morning but checked it yesterday morning it was 327; she is unaware of [name] recent A1c and there are no current records. She saw Dr. she would've orthopedics last week for an old injury to the left ankle, she states he did not see her toe, nor did she bring it to his attention. She smokes approximately 1 pack cigarettes [name] day. Her social situation is concerning, she arrives this morning with her mother who appears extremely intoxicated/under the influence; her mother was asked to leave the room and  be monitored by the patient's grandmother. The patient's aunt then accompanied the patient and the room throughout the rest of the appointment. We had [name] lengthy discussion regarding the deleterious effects of uncontrolled hyperglycemia and smoking as it relates to wound healing and overall health. She was strongly encouraged to decrease her smoking and get her diabetes under better control. She states she is currently on [name] diet and has cut down her [facility] consumption. The left toe is erythematous, macerated and slightly edematous with malodor present. The edema in her left foot is below her baseline, there is no erythema streaking. We will treat her with Santyl, doxycycline; we have ordered and xray, culture and provided [name] Peg assist surgical shoe and cultured the wound. 01/25/18-She is here in follow-up evaluation for [name] left great toe ulcer and presents with an abscess to her suprapubic area. She  states her blood sugars remain elevated, feeling "sick" and if levels are below 250, but she is trying. She has made no attempt to decrease her smoking stating that we "can't take away her food in her cigarettes". She has been compliant with offloading using the PEG assist you. She is using Santyl daily. the culture obtained last week grew staph aureus and Enterococcus faecalis; continues on the doxycycline and Augmentin was added on Monday. The suprapubic area has erythema, no femoral variation, purple discoloration, minimal induration, was accessed with [name] cotton tip applicator with sanguinopurulent drainage, this was cultured, I suspect the current antibiotic treatment will cover and we will not add anything to her current treatment plan. She was advised to go to urgent care or ER with any change in redness, induration or fever. 02/01/18-She is here in follow-up evaluation for left great toe ulcers and [name] new abdominal abscess from last week. She was able to use packing until earlier this week, where she  "forgot it was there". She states she was feeling ill with GI symptoms last week and was not taking her antibiotic. She states her glucose levels have been predominantly less than 200, with occasional levels between 200-250. She thinks this was contributing to her GI symptoms as they have resolved without intervention. There continues to be significant laceration to left toe, otherwise it clinically looks stable/improved. There is now less superficial opening to the lateral aspect of the great toe that was residual blister. We will transition to [facility] to all wounds, she will continue her Augmentin. If there is no change or deterioration next week for reculture. 02/08/18-She is here in follow-up evaluation for left great toe ulcer and abdominal ulcer. There is an improvement in both wounds. She has been wrapping her left toe with coban, not by our direction, which has created an area of discoloration to the medial aspect; she has been advised to NOT use coban secondary to her neuropathy. She states her glucose levels have been high over this last week ranging from 200-350, she continues to smoke. She admits to being less compliant with her offloading shoe. We will continue with same treatment plan and she will follow-up next week. 02/15/18-She is here in follow-up evaluation for left great toe ulcer and abdominal ulcer. The abdominal ulcer is epithelialized. The left great toe ulcer is improved and all injury from last week using the Coban wrap is resolved, the lateral ulcer is healed. She admits to noncompliance with wearing offloading shoe and admits to glucose levels being greater than 300 most of the week. She continues to smoke and expresses no desire to quit. There is one area medially that probes deeper than it has historically, erythema to the toe and dorsal foot has consistently waxed and waned. There is no overt signs of cellulitis or infection but we will culture the wound for any  occult infection given the new area of depth and erythema. We will hold off on sensitivities for initiation of antibiotic therapy. 02/22/18-She is here in follow up evaluation for left great toe ulcer. There is overall significant improvement in both wound appearance, erythema and edema with changes made last week. She was not initiated on antibiotic therapy. Culture obtained last week showed oxacillin sensitive staph aureus, sensitive to clindamycin. Clindamycin has been called into the pharmacy but she has been instructed to hold off on initiation secondary to overall clinical improvement and her history of antibiotic intolerance. She has been instructed to contact the clinic with any  noted changes/deterioration and the wound, erythema, edema and/or pain. She will follow-up next week. She continues to smoke and her glucose levels remain elevated >250; she admits to compliance with offloading shoe 03/01/18 on evaluation today patient appears to be doing fairly well in regard to her left first toe ulcer. She has been tolerating the dressing changes with the [facility] Dressing without complication and overall this has definitely showed signs of improvement according to records as well is what the patient tells me today. I'm very pleased in that regard. She is having no pain today 03/08/18 She is here for follow up evaluation of [name] left great toe ulcer. She remains non-compliant with glucose control and smoking cessation; glucose levels consistently >200. She states that she got new shoe inserts/peg assist. She admits to compliance with offloading. Since my last evaluation there is significant improvement. We will switch to prisma at this time and she will follow up next week. She is noted to be tachycardic at this appointment, heart rate 120s; she has [name] history of heart rate 70-130 according to our records. She admits to extreme agitation r/t personal issues; she was advised to monitor her heartrate  and contact her physician if it does not return to [name] more normal range (<100). She takes cardizem twice daily. 03/15/18-She is here in follow-up evaluation for left great toe ulcer. She remains noncompliant with glucose control and smoking cessation. She admits to compliance with wearing offloading shoe. The ulcer is improved/stable and we will continue with the same treatment plan and she will follow-up next week 03/22/18-She is here for evaluation for left great toe ulcer. There continues to be significant improvement despite recurrent hyperglycemia (over 500 yesterday) and she continues to smoke. She has been compliant with offloading and we will continue with same treatment plan and she will follow-up next week. 03/29/18-She is here for evaluation for left great toe ulcer. Despite continuing to smoke and uncontrolled diabetes she continues to improve. She is compliant with offloading shoe. We will continue with the same treatment plan and she will follow-up next week 04/05/18- She is here in follow up evaluation for [name] left great toe ulcer; she presents with small pustule to left fifth toe (resembles ant bite). She admits to compliance with wearing offloading shoe; continues to smoke or have uncontrolled blood glucose control. There is more callus than usual with evidence of bleeding; she denies known trauma. 04/12/18-She is here for evaluation of left great toe ulcer. Despite noncompliance with glycemic control and smoking she continues to make improvement. She continues to wear offloading shoe. The pustule, that was identified last week, to the left fifth toe is resolved. She will follow-up in 2 weeks 05/03/18-she is seen in follow-up evaluation for [name] left great toe ulcer. She is compliant with offloading, otherwise noncompliant with glycemic control and smoking. She has plateaued and there is minimal improvement noted. We will transition to [facility], replaced the insert to her surgical shoe and  she will follow-up in one week 05/10/18- She is here in follow up evaluation for [name] left great toe ulcer. It appears stable despite measurement change. We will continue with same treatment plan and follow up next week. 05/24/18-She is seen in follow-up evaluation for [name] left great toe ulcer. She remains compliant with offloading, has made significant improvement in her diet, decreasing the amount of sugar/soda. She said her recent A1c was 10.9 which is lower than. She did see [name] diabetic nutritionist/educator yesterday. She continues to smoke.  We will continue with the same treatment plan and she'll follow-up next week. 05/31/18- She is seen in follow-up evaluation for left great toe ulcer. She continues to remain compliant with offloading, continues to make improvement in her diet, increasing her water and decreasing the amount of sugar/soda. She does continue to smoke with no desire to quit. We will apply Prisma to the depth and Hydrofera Blue over. We have not received insurance authorization for oasis. She will follow up next week. 06/07/18-She is seen in follow-up evaluation for left great toe ulcer. It has stalled according to today's measurements although base appears stable. She says she saw [name] diabetic educator yesterday; her average blood sugars are less than 300 which is an improvement for her. She continues to smoke and states "that's my next step" She continues with water over soda. We will order for xray, culture and reinstate ace wrap compression prior to placing apligraf for next week. She is voicing no complaints or concerns. Her dressing will change to iodoflex over the next week in preparation for apligraf. 06/14/18-She is seen in follow-up evaluation for left great toe ulcer. Plain film x-ray performed last week was negative for osteomyelitis. Wound culture obtained last week grew strep B and OSSA; she is initiated on keflex and cefdinir today; there is erythema to the toe which could be from  ace wrap compression, she [name], [name] ([id number]) [id number] Page 5 of 21 has [name] history of wrapping too tight and has has been encouraged to maintain ace wraps that we place today. We will hold off on application of apligraf today, will apply next week after antibiotic therapy has been initiated. She admits today that she has resumed taking [name] shower with her foot/toe submerged in water, she has been reminded to keep foot/toe out of the bath water. She will be seen in follow up next week 06/21/18-she is seen in follow-up evaluation for left great toe ulcer. She is tolerating antibiotic therapy with no GI disturbance. The wound is stable. Apligraf was applied today. She has been decreasing her smoking, only had 4 cigarettes yesterday and 1 today. She continues being more compliant in diabetic diet. She will follow-up next week for evaluation of site, if stable will remove at 2 weeks. 06/28/18- She is here in follow up evalution. Apligraf was placed last week, she states the dressing fell off on Tuesday and she was dressing with hydrofera blue. She is healed and will be discharged from the clinic today. She has been instructed to continue with smoking cessation, continue monitoring glucose levels, offloading for an additional 4 weeks and continue with hydrofera blue for additional two weeks for any possible microscopic opening. Readmission: 08/07/18 on evaluation today patient presents for reevaluation concerning the ulcer of her right great toe. She was previously discharged on 06/28/18 healed. Nonetheless she states that this began to show signs of drainage she subsequently went to her primary care provider. Subsequently an x-ray was performed on 08/01/18 which was negative. The patient was also placed on antibiotics at that time. Fortunately they should have been effective for the infection. Nonetheless she's been experiencing some improvement but still has [name] lot of  drainage coming from the wound itself. 08/14/18 on evaluation today patient's wound actually does show signs of improvement in regard to the erythema at this point. She has completed the antibiotics. With that being said we did discuss the possibility of placing her in [name] total contact cast as of today although I think that I  may want to give this just [name] little bit more time to ensure nothing recurrence as far as her infection is concerned. I do not want to put in the cast and risk infection at that time if things are not completely resolved. With that being said she is gonna require some debridement today. 08/21/18 on evaluation today patient actually appears to be doing okay in regard to her toe ulcer. She's been tolerating the dressing changes without complication. With that being said it does appear that she is ready and in fact I think it's appropriate for [place] to go ahead and initiate the total contact cast today. Nonetheless she will require some sharp debridement to prepare the wound for application. Overall I feel like things have been progressing well but we do need to do something to get this to close more readily. 08/24/18 patient seen today for reevaluation after having had the total contact cast applied on Tuesday. She seems to have done very well the wound appears to be doing great and overall I'm pleased with the progress that she's made. There were no abnormal areas of rubbing from the cast on her lower extremity. 08/30/18 on evaluation today patient actually appears to be completely healed in regard to her plantar toe ulcer. She tells me at this point she's been having [name] lot of issues with the cast. She almost fell [name] couple of times the state shall the step of her dog [name] couple times as well. This is been [name] very frustrating process for her other nonetheless she has completely healed the wound which is excellent news. Overall there does not appear to be the evidence of infection at this  time which is great news. 09/11/18 evaluation today patient presents for follow-up concerning her great toe ulcer on the left which has unfortunately reopened since I last saw her which was only [name] couple of weeks ago. Unfortunately she was not able to get in to get the shoe and potentially the AFO that's gonna be necessary due to her left foot drop. She continues with offloading shoe but this is not enough to prevent her from reopening it appears. When we last had her in the total contact cast she did well from [name] healing standpoint but unfortunately the wound reopened as soon as she came out of the cast within just [name] couple of weeks. 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 This is also part of the reason why she falls on [name] regular basis. I just do not believe that is gonna be safe for [place] to reinitiate the total contact cast as last time we had this on she fell 3 times one week which is definitely not normal for her. 09/18/18 upon evaluation today the patient actually appears to be doing about the same in regard to her toe ulcer. She did not contact [employer] as I asked her to even though I had given her the prescription. In fact she actually states that she has no idea where the prescription is. She did apparently call [employer] and they told her that all she needed to do was bring the prescription in order to be able to be seen and work on getting the AFO for her left foot. With all that being said she still does  not have an appointment and I'm not sure were things stand that regard. I will give her [name] new prescription today in order to contact them to get this set up. 09/25/18 on evaluation today patient actually appears to be doing about the same in regard to her toes ulcer. She does have [name] small areas  which seems to have [name] lot of callous buildup around the edge of the wound which is going to need sharp debridement today. She still is waiting to be scheduled for evaluation with [employer] for possibility of an AFO. She states there supposed to call her tomorrow to get this set up. Unfortunately it does appear that her foot specifically the toe area is showing signs of erythema. There does not appear to be any systemic infection which is in these good news. 10/02/18 on evaluation today patient actually appears to be doing about the same in regard to her toe ulcer. This really has not done too well although it's not significantly larger it's also not significantly smaller. She has been tolerating the dressing changes without complication. She actually has her appointment with [facility] and [place] tomorrow to hopefully be measured for obtaining and AFO splint. I think this would be helpful preventing this from reoccurring. We had contemplated starting the cast this week although to be honest I am reluctant to do that as she's been having nausea, vomiting, and seizure activity over the past three days. She has [name] history of seizures and have been told is nothing that can be done for these. With that being said I do believe that along with the seizures have the nausea vomiting which upon further questioning doesn't seem to be the normal for her and makes me concerned for the possibility of infection or something else going on. I discussed this with the patient and her mother during the office visit today. I do not feel the wound is effective but maybe something else. The responses this was "this just happens to her at times and we don't know why". They did not seem to be interested in going to the hospital to have this checked out further. 10/09/18 on evaluation today patient presents for follow-up concerning her ongoing toe ulcer. She has been tolerating the dressing changes without complication. Fortunately  there does not appear to be any evidence of infection which is great news however I do think that the patient would benefit from going ahead for with the total contact cast. She's actually in [name] wheelchair today she tells me that she will use her walker if we initiate the cast. I was very specific about the fact that if we were gonna do the cast I wanted to make sure that she was using the walker in order to prevent any falls. She tells me she does not have stairs that she has to traverse on [name] regular basis at her home. She has not had any seizures since last week again that something that happens to her often she tells me she did talk to [employer] and they said that it may take up to three weeks to get the brace approved for her. Hopefully that will not take that long but nonetheless in the meantime I do think the cast could be of benefit. 10/12/18 on evaluation today patient appears to be doing rather well in regard to her toe ulcer. It's just been [name] few days and already this is significantly improved both as far as overall appearance and size. Fortunately there's no sign of  infection. She is here for her first obligatory cast change. 10/19/18 Seen today for follow up and management of left great toe ulcer. Wound continues to show improvement. Noted small open area with seroussang drainage with palpation. Denies any increased pain or recent fevers during visit. She will continue calcium alginate with offloading shoe. Denies any questions or concerns during visit. 10/26/18 on evaluation today patient appears to be doing about the same as when I last saw her in regard to her wound bed. Fortunately there does not appear to be any signs of infection. Unfortunately she continues to have [name] breakdown in regard to the toe region any time that she is not in the cast. It takes almost no time at all for this to happen. Nonetheless she still has not heard anything from the brace being made by [employer] as to when  exactly this will be available to her. Fortunately there is no signs of infection at this time. 10/30/18 on evaluation today patient presents for application of the total contact cast as we just received him this morning. Fortunately we are gonna be able to apply this to her today which is great news. She continues to have no significant pain which is good news. Overall I do feel like things have been improving while she was the cast is when she doesn't have [name] cast that things get worse. She still has not really heard anything from [place] regarding her brace. 11/02/18 upon evaluation today patient's wound already appears to be doing significantly better which is good news. Fortunately there does not appear to be any signs of infection also good news. Overall I do think the total contact cast as before is helping to heal this area unfortunately it's just not gonna likely keep the area closed and healed without her getting her brace at least. Again the foot drop is [name] significant issue for her. [name], [name] ([id number]) [id number] Page 6 of 21 11/09/18 on evaluation today patient appears to be doing excellent in regard to her toe ulcer which in fact is completely healed. Fortunately we finally got the situation squared away with the paperwork which was needed to proceed with getting her brace approved by Medicaid. I have filled that out unfortunately that information has been sent to the orthopedic office that I worked at 2 1/2 years ago and not tired Current wound care measures. Fortunately she seems to be doing very well at this time. 11/23/18 on evaluation today patient appears to be doing More Poorly Compared to Last Time I Saw Her. At [facility] She Had Completely Healed. Currently she is continuing to have issues with reopening. She states that she just found out that the brace was approved through Medicaid now she just has to go get measured in order to have this fitted  for her and then made. Subsequently she does not have an appointment for this yet that is going to complicate things we obviously cannot put her back in the cast if we do not have everything measured because they're not gonna be able to measure her foot while she is in the cast. 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 Obviously this is unfortunate and does have me somewhat worried as well. 11/30/18 on evaluation today patient's toe ulcer actually appears to be doing fairly well. The good news is she will be getting her brace in the  shoes next week on Wednesday. Hopefully we will be able to get this to heal without having to go back in the cast however she may need the cast in order to get the wound completely heal and then go from there. Fortunately there's no signs of infection at this time. 12/07/18 on evaluation today patient fortunately did receive her brace and she states she could tell this definitely makes her walk better. With that being said she's been having issues with her toe where she noticed yesterday there was [name] lot of tissue that was loosing off this appears to be much larger than what it was previous. She also states that her leg has been read putting much across the top of her foot just about the ankle although this seems to be receiving somewhat. The total area is still red and appears to be someone infected as best I can tell. She is previously taken Bactrim and that may be [name] good option for her today as well. We are gonna see what I wound culture shows as well and I think that this is definitely appropriate. With that being said outside of the culture I still need to initiate something in the interim and that's what I'm gonna go ahead and select Bactrim is [name] good option for her. 12/14/18 on evaluation today patient appears to be doing better in regard to her left great toe ulcer as  compared to last week's evaluation. There's still some erythema although this is significantly improved which is excellent news. Overall I do believe that she is making good progress is still gonna take some time before she is where I would like her to be from the standpoint of being able to place her back into the total contact cast. Hopefully we will be where we need to be by next week. 12/21/18 on evaluation today patient actually appears to be doing poorly in regard to her toe ulcer. She's been tolerating the dressing changes without complication. Fortunately there's no signs of systemic infection although she does have [name] lot of drainage from the toe ulcer and this does seem to be causing some issues at this point. She does have erythema on the distal portion of her toe that appears to be likely cellulitis. 12/28/18 on evaluation today patient actually appears to be doing [name] little better in my pinion in regard to her toe ulcer. With that being said she still does have some evidence of infection at this time and for her culture she had both E. coli as well as enterococcus as organisms noted on evaluation. For that reason I think that though the Keflex likely has treated the E. coli rather well this has really done nothing for the enterococcus. We are going to have to initiate treatment for this specifically. 01/04/19 on evaluation today patient's toe actually appears to be doing better from the standpoint of infection. She currently would like to see about putting the cash back on I think that this is appropriate as long as she takes care of it and keeps it from getting wet. She is gonna have some drainage we can definitely pass this up with Drawtex and alginate to try to prevent as much drainage as possible from causing the problems. With that being said I do want to at least try her with the cast between now and Tuesday. If there any issues we can't continue to use it then I will discontinue the use  of the cast at that point. 01/08/19 on  evaluation today patient actually appears to be doing very well as far as her foot ulcer specifically the great toe on the left is concerned. She did have an area of rubbing on the medial aspect of her left ankle which again is from the cast. Fortunately there's no signs of infection at this point in this appears to be [name] very slight skin breakdown. The patient tells me she felt it rubbing but didn't think it was that bad. Fortunately there is no signs of active infection at this time which is good news. No fevers, chills, nausea, or vomiting noted at this time. 01/15/19 on evaluation today patient actually appears to be doing well in regard to her toe ulcer. Again as previous she seems to do well and she has the cast on which indicates to me that during the time she doesn't have [name] cast on she's putting way too much pressure on this region. Obviously I think that's gonna be an issue as with the current national emergency concerning the Covid-19 Virus it has been recommended that we discontinue the use of total contact casting by the chief medical officer of our company, Dr. Simona Huh. The reasoning is that if [name] patient becomes sick and cannot come into have the cast removed they could not just leave this on for an additional two weeks. Obviously the hospitals also do not want to receive patient's who are sick into the emergency department to potentially contaminate the region and spread the Covid-19 Virus among other sick individuals within the hospital system. Therefore at this point we are suspending the use of total contact cast until the current emergency subsides. This was all discussed with the patient today as well. 01/22/19 on evaluation today patient's wound on her left great toe appears to be doing slightly worse than previously noted last week. She tells me that she has been on this quite [name] bit in fact she tells me she's been awake for 38 straight hours. This  is due to the fact that she's having to care for grandparents because nobody else will. She has been taking care of them for five the last seven days since I've seen her they both have dementia his is from [name] stroke and her grandmother's was progressive. Nonetheless she states even her mom who knows her condition and situation has only help two of those days to take care of them she's been taking care of the rest. Fortunately there does not appear to be any signs of active infection in regard to her toe at this point although obviously it doesn't look as good as it did previous. I think this is directly related to her not taking off the pressure and friction by way of taking things easy. Though I completely understand what's going on. 01/29/19 on evaluation today patient's tools are actually appears to be showing some signs of improvement today compared to last week's evaluation as far as not necessarily the overall size of the wound but the fact that she has some new skin growth in between the two ends of the wound opening. Overall I feel like she has done well she states that she had [name] family member give her what sounds to be [name] CAM walker boot which has been helpful as well. 02/05/19 on evaluation today patient's wound bed actually appears to be doing significantly better in regard to her overall appearance of the size of the wound. With that being said she is still having an issue with offloading efficiently  enough to get this to close. Apparently there is some signs of infection at this point as well unfortunately. Previously she's done well of Augmentin I really do not see anything that needs to be culture currently but there theme and cellulitis of the foot that I'm seeing I'm gonna go ahead and place her on an antibiotic today to try to help clear this up. 02/12/2019 on evaluation today patient actually appears to be doing poorly in regard to her overall wound status. She tells me she has been using  her offloading shoe but actually comes in today wearing her tennis shoe with the AFO brace. Again as I previously discussed with her this is really not sufficient to allow the area to heal appropriately. Nonetheless she continues to be somewhat noncompliant and I do wonder based on what she has told my nurse in the past as to whether or not [name] good portion of this noncompliance may be recreational drug and alcohol related. She has had [name] history of heroin overdose and this was fairly recently in the past couple of months that have been seeing her. Nonetheless overall I feel like her wound looks significantly worse today compared to what it was previous. She still has significant erythema despite the Augmentin I am not sure that this is an appropriate medication for her infection I am also concerned that the infection may have gone down into her bone. 02/19/19 on evaluation today patient actually appears to be doing about the same in regard to her toe ulcer. Unfortunately she continues to show signs of bone exposure and infection at this point. There does not appear to be any evidence of worsening of the infection but I'm also not really sure that it's getting significantly better. She is on the Augmentin which should be sufficient for the Staphylococcus aureus infection that she has at this point. With that being said she may need IV antibiotics to more appropriately treat this. We did have [name] discussion today about hyperbaric option therapy. 02/28/19 on evaluation today patient actually appears to be doing much worse in regard to the wound on her left great toe as compared to even my previous evaluation last week. Unfortunately this seems to be training in [name] pretty poor direction. Her toe was actually now starting to angle laterally and I can actually see the entire joint area of the proximal portion of the digit where is the distal portion of the digit again is no longer even in contact with the joint  line. Unfortunately there's [name] lot more necrotic tissue around the edge and the toe appears to be showing signs of becoming gangrenous in my pinion. I'm very concerned about were things stand at this point. She did see infectious disease and they are planning to send in [name] prescription for Sivextro for her and apparently this has been approved. With that being said I don't think she should avoid taking this but at the same time I'm not sure that it's gonna be sufficient Likes, Malarie [name] ([id number]) [id number] Page 7 of 21 to save her toe at this point. She tells me that she still having to care for grandparents which I think is putting quite [name] bit of strain on her foot and specifically the total area and has caused this to break down even to [name] greater degree than would've otherwise been expected. 03/05/19 on evaluation today patient actually appears to be doing quite well in regard to her toe all things considering. She still has bone  exposed but there appears to be much less your thing on overall the appearance of the wound and the toe itself is dramatically improved. She still does have some issues currently obviously with infection she did see vascular as well and there concerned that her blood flow to the toad. For that reason they are setting up for an angiogram next week. 03/14/19 on evaluation today patient appears to be doing very poor in regard to her toe and specifically in regard to the ulceration and the fact that she's starting to notice the toe was leaning even more towards the lateral aspect and the complete joint is visible on the proximal aspect of the joint. Nonetheless she's also noted [name] significant odor and the tip of the toe is turning more dark and necrotic appearing. Overall I think she is getting worse not better as far as this is concerned. For that reason I am recommending at this point that she likely needs to be seen for likely  amputation. READMISSION 03/19/2021 This is [name] patient that we cared for in this clinic for [name] prolonged period of time in 2019 and 2020 with [name] left foot and left first toe wound. 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 Luana Shu. In December 2021 she had an ulcer on her right great toe as well as the fourth and fifth toes. 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 She also had an angiogram at that time and underwent angioplasty of the right anterior tibial artery. In any case she claims that the wound on the right foot is closed I did not look at this today which was probably an oversight although I think that should be done next week. 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 According to Dr. Deborra Medina last review that she was out of the area being cared for by another physician but recently came back to his attention. 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 [name] culture of this area showed E. coli apparently before she came back to see Dr. Luana Shu she was supposed to be receiving antibiotics but she did not really take them. Nor is she offloading this area at all. Finally her last hemoglobin A1c listed in epic was in March 2022 at 14.1 she says things are [name] lot better since then although I am not sure. 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 She had this wound at the time. During this admission she had E. coli sepsis [name] TEE was negative. 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 She is still [name] heavy cigarette smoker. She has [name] history of bipolar disorder seizure disorder ABI in our clinic was 1.05 6/1; left midfoot in the setting of [name] TMA done previously. Round circular wound with [name] "knuckle" of protruding tissue. The problem is that the knuckle was not attached to any of the surrounding granulation and this probed proximally widely I removed [name] large portion of this tissue. This wound goes with considerable undermining laterally. I do not feel any bone  there was no purulence but this is [name] deep wound. 6/8; in spite of the debridement I did last week. She arrives with [name] wound looking exactly the same. [name] protruding "knuckle" of tissue nonadherent to most of the surrounding tissue. There is considerable depth around this from 6-12 o'clock at 2.7 cm and undermining of 1 cm. This does not look overtly infected and the x- ray I did last week was negative for any osseous abnormalities. We have been using silver collagen 6/15; deep tissue culture I did last week showed moderate staph aureus and moderate Pseudomonas. This will definitely require prolonged antibiotic therapy. The pathology on the protuberant area was negative for malignancy fungus etc. 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 We have been using silver collagen. I am going to be prescribing Levaquin for 2 weeks. Her CT scan of the foot is down for 7/5 6/22; CT scan of the foot on 7 5. She says she has hardware in the left leg from her previous fracture. She is on the Levaquin for the deep tissue culture I did that showed methicillin sensitive staph aureus and Pseudomonas. I gave her [name] 2-week supply and she will have another week. She arrives in clinic today with the same protuberant tissue however this is nonadherent to the tissue surrounding it. I am really at [name] loss to explain this unless there is underlying deep  tissue infection 6/29; patient presents for 1 week follow-up. She has been using collagen to the wound bed. She reports taking her antibiotics as prescribed.She has no complaints or issues today. She denies signs of infection. 7/6; patient presents for one week followup. She has been using collagen to the wound bed. She states she is taking Levaquin however at times she is not able to keep it down. She denies signs of infection. 7/13; patient presents for 1 week follow-up. She has been using silver alginate to the wound bed. She still has nausea when taking her antibiotics. She denies signs of infection. 7/20; patient presents for 1 week follow-up. She has been using silver alginate with gentamicin cream to the wound bed. She denies any issues and has no complaints today. She denies signs of infection. 7/27; patient presents for 1 week follow-up. She continues to use silver alginate with gentamicin cream to the wound bed. She reports starting her antibiotics. She has no issues or complaints. Overall she reports stability to the wound. 8/3; patient presents for 1 week follow-up. She has been using silver alginate with gentamicin cream to the wound bed. She reports completing all antibiotics. She has no issues or complaints today. She denies signs of infection. 8/17; patient presents for 2-week follow-up. He is to use silver alginate to the wound bed. She has no issues or complaints today. She denies signs of infection. She reports her pain has improved in her foot since last clinic visit 8/24; patient presents for 1 week follow-up. She continues to use silver alginate to the wound bed. She has no issues or complaints. She denies signs of infection. Pain is stable. 9/7; patient presents for follow-up. She missed her last week appointment due to feeling ill. She continues to use silver alginate. She has [name] new wound to the right lower extremity that is covered in eschar. She states It occurred over  the past week and has no idea how it started. She currently denies signs of infection. 9/14; patient presents for follow-up. T the left foot wound she  has been using gentamicin cream and silver alginate. T the right lower extremity wound she has o o been keeping this covered and has not obtain Santyl. 9/21; patient presents for follow-up. She reports using gentamicin cream and silver alginate to the left foot and Santyl to the right lower extremity wound. She has no issues or complaints today. She denies signs of infection. 9/28; patient presents for follow-up. She reports [name] new wound to her right heel. She states this occurred [name] few days ago and is progressively gotten worse. She has been trying to clean the area with [name] Q-tip and Santyl. She reports stability in the other 2 wounds. She has been using gentamicin cream and silver alginate to the left foot and Santyl to the right lower extremity wound. 10/12; patient presents for follow-up. She reports improvement to the wound beds. She is seeing vein and vascular to discuss the potential of [name] left BKA. She states they are going to do an arteriogram. She continues to use silver alginate with dressing changes to her wounds. 11/2; patient presents for follow-up. She states she has not been doing dressing changes to the wound beds. She states she is not able to offload the areas. She reports chronic pain to her left foot wound. 11/9; patient presents for follow-up. She came in with only socks on. She states she forgot to put on shoes. It is unclear if she is doing any dressing changes. She currently denies systemic signs of infection. 11/16; patient presents for follow-up. She came again only with socks on. She states she does not wear shoes ever. It is unclear if she does dressing changes. She currently denies systemic signs of infection. 11/23; patient presents for follow-up. She wore her shoes today. It still unclear exactly what dressing she is  using for each wound but she did states she obtained Dakin's solution and has been using this to the left foot wound. She currently denies signs of infection. 11/30; patient presents for follow-up. She has no issues or complaints today. She currently denies signs of infection. [name], [name] ([id number]) [id number] Page 8 of 21 12/7; patient presents for follow-up. She has no issues or complaints today. She has been using Hydrofera Blue to the right heel wound and Dakin solution to the left foot wound. Her right anterior leg wound is healed. She currently denies signs of infection. 12/14; patient presents for follow-up. She has been using Hydrofera Blue to the right heel and Dakin's to the left foot wounds. She has no issues or complaints today. She denies signs of infection. 12/21; patient presents for follow-up. She reports using Hydrofera Blue to the right heel and Dakin's to the left foot wound. She denies signs of infection. 12/28; patient presents for follow-up. She continues to use Dakin's to the left foot wound and Hydrofera Blue to the right heel wound. She denies signs of infection. 1/4; patient presents for follow-up. She has no issues or complaints today. She denies signs of infection. 1/11; patient presents for follow-up. It is unclear if she has been dressing these wounds over the past week. She currently denies signs of infection. 1/18; patient presents for follow-up. She states she has been using Dakin's wet-to-dry dressings to the left foot. She has been using Hydrofera Blue to the right foot foot wound. She states that the anterior right leg wound has reopened and draining serous fluid. She denies signs of infection. 1/25; patient presents for follow-up. She has no issues or complaints today. 2/1; patient  presents for follow-up. She has no issues or complaints today. She denies signs of infection. 2/8; patient presents for follow-up. She has lost her  surgical shoes. She did not have [name] dressing to the right heel wound. She currently denies signs of infection. 2/15; patient presents for follow-up. She reports more pain to the right heel today. She denies purulent drainage Or fever/chills 2/22; patient presents for follow-up. She reports taking clindamycin over the past week. She states that she continues to have pain to her right heel. She reports purulent drainage. Readmission 03/16/2022 Ms. Dhanya Bogle is [name] 47 year old female with [name] past medical history of type 2 diabetes, osteomyelitis to her feet, chronic systolic heart failure and bipolar disorder that presents to the clinic for bilateral feet wounds and right lower extremity wound. She was last seen in our clinic on 12/15/2021. At that time she had purulent drainage coming out of her right plantar foot and I recommended she go to the ED. She states she went to [facility] and has been there for the past 3 months. I cannot see the records. She states she had OR debridement and was on several weeks of IV antibiotics while inpatient. Since discharge she has not been taking care of the wound beds. She had nothing on her feet other than socks today. She currently denies signs of infection. 5/31; patient presents for follow-up. She has been using Dakin's wet-to-dry dressings to the wound beds on her feet bilaterally and antibiotic ointment to the right anterior leg wound. She had [name] wound culture done at last clinic visit that showed moderate Pseudomonas aeruginosa sensitive to ciprofloxacin. She currently denies systemic signs of infection. 6/14; patient presents for follow-up. She received Keystone 5 days ago and has been using this on the wound beds. She states that last week she had to go to the hospital because she had increased warmth and erythema to the right foot. She was started on 2 oral antibiotics. She states she has been taking these. She currently denies systemic signs of  infection. She has no issues or complaints today. 6/21; patient presents for follow-up. She states she has been using Keystone antibiotics to the wound beds. She has no issues or complaints today. She denies signs of infection. 6/28; patient presents for follow-up. She has been using Keystone antibiotics to the wound beds. She has no issues or complaints today. 7/12; patient presents for follow-up. Has been using Keystone antibiotics to the wound beds with calcium alginate. She has no issues or complaints today. She never followed up with her orthopedic surgeon who did the OR debridement to the right foot. We discussed the total contact cast for the left foot and patient would like to do this next week. 7/19; patient presents for follow-up. She has been using Keystone antibiotics with calcium alginate to the wound beds. She has no issues or complaints today. Patient is in agreement to do the total contact cast of the left foot today. She knows to return later this week for the obligatory cast change. 05-13-2022 upon evaluation today patient's wound which she has the cast of the left leg actually appears to be doing significantly better. Fortunately I do not see any signs of active infection locally or systemically which is great news and overall I am extremely pleased with where we stand currently. 7/26; patient presents for follow-up. She has [name] cast in place for the past week. She states it irritated her shin. Other than that she tolerated the cast  well. She states she would like [name] break for 1 week from the cast. We have been using Keystone antibiotic and Aquacel to both wound beds. She denies signs of infection. 8/2; patient presents for follow-up. She has been using Keystone and Aquacel to the wound beds. She denies any issues and has no complaints. She is agreeable to have the cast placed today for the left leg. 06-03-2022 upon evaluation today patient appears to be doing well with regard to her  wound she saw some signs of improvement which is great news. Fortunately I do not see any evidence of active infection locally or systemically at this time which is great news. No fevers, chills, nausea, vomiting, or diarrhea. 8/16; patient presents for follow-up. She has no issues or complaints today. We have been using Keystone and Aquacel to the wound beds. The left lower extremity is in [name] total contact cast. She is tolerated this well. 8/23; patient presents for follow-up. She has had the total contact cast on the left leg for the past week. Unfortunately this has rubbed and broken down the skin to the medial foot. She currently denies signs of infection. She has been using Keystone antibiotic to the right plantar foot wound. 8/30; patient presents for follow-up. We have held off on the total contact cast for the left leg for the past week. Her wound on the left foot has improved and the previous surrounding breakdown of skin has epithelialized. She has been using Keystone antibiotic to both wound beds. She has no issues or complaints today. She denies signs of infection. 9/6; patient presents for follow-up. She has ordered her's Keystone antibiotic and this is arriving this week. She has been doing Dakin's wet-to-dry dressings to the wound beds. She denies signs of infection. She is agreeable to the total contact cast today. 9/13; patient presents for follow-up. She states that the cast caused her left leg shin to be sore. She would like to take [name] break from the cast this week. She has been using Keystone antibiotic to the right plantar foot wound. She denies signs of infection. 9/20; patient presents for follow-up. She has been using Keystone antibiotics to the wound beds with calcium alginate to the right foot wound and Hydrofera Blue to the left foot wound. She is agreeable to having the cast placed today. She has been approved for Apligraf and we will order this for next clinic visit. 9/27;  patient presents for follow-up. We have been using Keystone antibiotic with Hydrofera Blue to the left foot wound under [name] total contact cast. T the right o Berryman, Shalese [name] ([id number]) [id number] Page 9 of 21 foot wound she has been using Keystone antibiotic and calcium alginate. She declines [name] total contact cast today. Apligraf is available for placement and she would like to proceed with this. 07-28-2022 upon evaluation today patient appears to be doing well currently in regard to her wound. She is actually showing signs of significant improvement which is great news. Fortunately I do not see any evidence of active infection locally nor systemically at this time. She has been seeing Dr. Heber [place] and to be honest has been doing very well with the cast. Subsequently she comes in today with [name] cast on and we did reapply that today as well. She did not really want to she try to talk me out of that but I explained that if she wanted to heal this is really the right way to go. Patient voiced understanding. In regard to  her right foot this is actually [name] lot better compared to the last time I saw her which is also great news. 10/11; patient presents for follow-up. Apligraf and the total contact cast was placed to the left leg at last clinic visit. She states that her right foot wound had burning pain to it with the placement of Apligraf to this area. She has been doing Arden over this area. She denies signs of infection including increased warmth, erythema or purulent drainage. 11/1; 3-week follow-up. The patient fortunately did not have [name] total contact cast or an Apligraf and on the left foot. She has been using Keystone ABD pads and kerlix and her own running shoes She arrives in clinic today with thick callus and [name] very poor surface on the left foot on the right nonviable skin subcutaneous tissue and [name] deep probing hole. 11/15; patient missed her last clinic appointment. She  states she has not been dressing the wound beds for the past 2 weeks. She states that at she had [name] new roommate but is now going back to live with her mother. Apparently its been [name] distracting 2 weeks. Patient currently denies signs of infection. 11/22; patient presents for follow-up. She states she has been using Keystone antibiotic and Dakin's wet-to-dry dressings to the wound beds. She is agreeable for cast placement today. We had ordered Apligraf however this has not been received by our facility. 11/29; Patient had [name] total contact cast placed at last clinic visit and she tolerated this well. We were using silver alginate under the cast. Patient's been using Keystone antibiotic with Aquacel to the right plantar foot wound. She has no issues or complaints today. Apligraf is available for placement today. Patient would like to proceed with this. 12/6; patient presents for follow-up. She had Apligraf placed in standard fashion last clinic visit under the total contact cast to the left lower extremity. She has been using Keystone antibiotic and Aquacel to the right plantar foot wound. She has no issues or complaints today. 12/13; patient presents for follow-up. She has finished 5 Apligraf placements. Was told she would not qualify for more. We have been doing [name] total contact cast to the left lower extremity. She has been using Keystone antibiotic and Aquacel to the right plantar foot wound. She has no issues or complaints today. 12/20; patient presents for follow-up. We have been using Hydrofera Blue with Keystone antibiotic under [name] total contact cast of the left lower extremity. She reports using Keystone antibiotic and silver alginate to the right heel wound. She has no issues or complaints today. 12/27; patient presents with [name] healthy wound on the left midfoot. We have Apligraf to apply that to that more also using [name] total contact cast. On the right we are using Keystone and silver alginate. She is  offloading the right heel with [name] surgical shoealthough by her admission she is on her feet quite [name] bit 1/3; patient presents for follow-up. Apligraf was placed to the wound beds last clinic visit. She was placed in [name] total contact cast to the left lower extremity. She declines [name] total contact cast today. She states that her mother is in the hospital and she cannot adequately get around with the cast on. 1/10; patient presents for follow-up. She declined the total contact cast at last clinic visit. Both wounds have declined in appearance. She states that she has been on her feet and not offloading the wound beds. She currently denies signs of infection. 1/17; patient  presents for follow-up. She had the total contact cast along with Apligraf placed last week to the left lower extremity. She tolerated this well. She has been using Aquacel Ag and Keystone antibiotic to the right heel wound. She currently denies signs of infection. She has no issues or complaints today. 1/24; patient presents for follow-up. We have been using Apligraf to the left foot wound along with [name] total contact cast. She has done well with this. T the right o heel wound she has been using Aquacel Ag and Keystone antibiotic ointment. She has no issues or complaints today. She denies signs of infection. 1/31; patient presents for follow-up. We have been using Apligraf to the left foot wound along with the total contact cast. She continues to do well with this. To the right heel we have been using Aquacel Ag and Keystone antibiotic ointment. She has no issues or complaints today. Electronic Signature(s) Signed: 11/23/2022 1:38:11 PM By: Heather Shan DO Entered By: [name] on 11/23/2022 11:57:56 -------------------------------------------------------------------------------- Physical Exam Details Patient Name: Date of Service: MILEIDY, ATKIN 11/23/2022 11:00 [name]: [id number] Patient Account Number:  [id number] Date of Birth/Sex: Treating RN: July 31, 1976 (47 y.o. [name] Primary Care Provider: Raelene [name] Other Clinician: Massie [name] Referring Provider: Treating Provider/Extender: [name] in Treatment: [address] ELIZZIE, WESTERGARD [name] ([id number]) [id number] Page 10 of 21 Cardiovascular . Psychiatric . Notes Right foot: T the plantar heel there is an incision site with increased depth, No probing to bone. T the opening there is Nonviable tissue and granulation tissue. o o Left foot: T the medial aspect there is an open wound with granulation tissue and nonviable tissue. No overt signs of infection to any of the wound beds. o Electronic Signature(s) Signed: 11/23/2022 1:38:11 PM By: Heather Shan DO Entered By: [name] on 11/23/2022 11:58:43 -------------------------------------------------------------------------------- Physician Orders Details Patient Name: Date of Service: [name] 11/23/2022 11:00 [name]: [id number] Patient Account Number: [id number] Date of Birth/Sex: Treating RN: [birth year] (47 y.o. [name] Primary Care Provider: Raelene [name] Other Clinician: Massie [name] Referring Provider: Treating Provider/Extender: [name] in Treatment: 97 Verbal / Phone Orders: No Diagnosis Coding Follow-up Appointments Return Appointment in 1 week. Nurse Visit as needed Bathing/ Shower/ Hygiene May shower with wound dressing protected with water repellent cover or cast protector. No tub bath. Anesthetic (Use 'Patient Medications' Section for Anesthetic Order Entry) Lidocaine applied to wound bed Cellular or Tissue Based Products Cellular or Tissue Based Product Type: - Apligraf applied Cellular or Tissue Based Product applied to wound bed; including contact layer, fixation with steri-strips, dry gauze and cover  dressing. (DO NOT REMOVE). Edema Control - Lymphedema / Segmental Compressive Device / Other Elevate, Exercise Daily and [name] void Standing for Long Periods of Time. Elevate legs to the level of the heart and pump ankles as often as possible Elevate leg(s) parallel to the floor when sitting. Off-Loading Total Contact Cast to Left Lower Extremity Open toe surgical shoe - Heel off loader - Right foot Other: - keep pressure off of feet. Additional Orders / Instructions Follow Nutritious Diet and Increase Protein Intake Other: - Stay off of foot, wheelchair only Medications-Please add to medication list. Wound #11 Right,Plantar Foot Keystone Compound Wound Treatment Wound #11 - Foot Wound Laterality: Plantar, Right SAYDIE, GERDTS [name] ([id number]) [id number] Page 11 of 21 Cleanser: Wound Cleanser 1 x Per Day/30 Days Discharge Instructions: Wash your  hands with soap and water. Remove old dressing, discard into plastic bag and place into trash. Cleanse the wound with Wound Cleanser prior to applying [name] clean dressing using gauze sponges, not tissues or cotton balls. Do not scrub or use excessive force. Pat dry using gauze sponges, not tissue or cotton balls. Topical: keystone gel 1 x Per Day/30 Days Prim Dressing: Aquacel Extra Hydrofiber Dressing, 2x2 (in/in) ary 1 x Per Day/30 Days Secondary Dressing: ABD Pad 5x9 (in/in) (Generic) 1 x Per Day/30 Days Discharge Instructions: Cover with ABD pad Secured With: Medipore T - 59M Medipore H Soft Cloth Surgical T ape ape, 2x2 (in/yd) 1 x Per Day/30 Days Secured With: [employer] Sterile or Non-Sterile 6-ply 4.5x4 (yd/yd) 1 x Per Day/30 Days Discharge Instructions: Apply Kerlix as directed Wound #12 - Foot Wound Laterality: Plantar, Left, Medial Cleanser: Wound Cleanser 1 x Per Day/30 Days Discharge Instructions: Wash your hands with soap and water. Remove old dressing, discard into plastic bag and place into trash. Cleanse  the wound with Wound Cleanser prior to applying [name] clean dressing using gauze sponges, not tissues or cotton balls. Do not scrub or use excessive force. Pat dry using gauze sponges, not tissue or cotton balls. Peri-Wound Care: Triamcinolone Acetonide Cream, 0.1%, 15 (g) tube 1 x Per Day/30 Days Discharge Instructions: Apply as directed to irritated areas. Secondary Dressing: ABD Pad 5x9 (in/in) 1 x Per Day/30 Days Discharge Instructions: Cover with ABD pad Secondary Dressing: Foam Dressing, 4x4 (in/in) 1 x Per Day/30 Days Discharge Instructions: used for padding ankle and forefoot. Electronic Signature(s) Signed: 11/23/2022 1:38:11 PM By: Heather Shan DO Entered By: [name] on 11/23/2022 12:03:15 -------------------------------------------------------------------------------- Problem List Details Patient Name: Date of Service: [name] 11/23/2022 11:00 [name]: [id number] Patient Account Number: [id number] Date of Birth/Sex: Treating RN: 1975-12-08 (47 y.o. 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 03/16/2022 No Yes E11.621 Type 2 diabetes mellitus with foot ulcer 03/16/2022 No Yes [place], Arista [name] ([id number]) [id number] Page 12 of 21 E11.42 Type 2 diabetes mellitus with diabetic polyneuropathy 03/16/2022 No Yes L97.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 03/16/2022 No Yes of skin Inactive Problems Resolved Problems Electronic Signature(s) Signed: 11/23/2022  1:38:11 PM By: Heather Shan DO Entered By: [name] on 11/23/2022 11:55:36 -------------------------------------------------------------------------------- Progress Note Details Patient Name: Date of Service: [name] 11/23/2022 11:00 [name]: [id number] Patient Account Number: [id number] Date of Birth/Sex: Treating RN: Oct 02, 1976 (47 y.o. [name] Primary Care Provider: Raelene [name] Other Clinician: Massie [name] Referring Provider: Treating Provider/Extender: [name] in Treatment: 33 Subjective Chief Complaint Information obtained from Patient 03/19/2021; patient referred by Dr. Luana Shu who has been looking after her left foot for quite [name] period of time for review of [name] nonhealing area in the left midfoot 03/12/2022; bilateral feet wounds and right lower extremity wound. History of Present Illness (HPI) 01/18/18-She is here for initial evaluation of the left great toe ulcer. She is [name] poor historian in regards to timeframe in detail. She states approximately 4 weeks ago she lacerated her toe on  something in the house. She followed up with her primary care who placed her on Bactrim and ultimately [name] second dose of Bactrim prior to coming to wound clinic. She states she has been treating the toe with peroxide, Betadine and [name] Band-Aid. She did not check her blood sugar this morning but checked it yesterday morning it was 327; she is unaware of [name] recent A1c and there are no current records. She saw Dr. she would've orthopedics last week for an old injury to the left ankle, she states he did not see her toe, nor did she bring it to his attention. She smokes approximately 1 pack cigarettes [name] day. Her social situation is concerning, she arrives this morning with her mother who appears extremely intoxicated/under the influence; her mother was asked to leave the room and be monitored by the patient's grandmother. The patient's  aunt then accompanied the patient and the room throughout the rest of the appointment. We had [name] lengthy discussion regarding the deleterious effects of uncontrolled hyperglycemia and smoking as it relates to wound healing and overall health. She was strongly encouraged to decrease her smoking and get her diabetes under better control. She states she is currently on [name] diet and has cut down her [facility] consumption. The left toe is erythematous, macerated and slightly edematous with malodor present. The edema in her left foot is below her baseline, there is no erythema streaking. We will treat her with Santyl, doxycycline; we have ordered and xray, culture and provided [name] Peg assist surgical shoe and cultured the wound. 01/25/18-She is here in follow-up evaluation for [name] left great toe ulcer and presents with an abscess to her suprapubic area. She states her blood sugars remain elevated, feeling "sick" and if levels are below 250, but she is trying. She has made no attempt to decrease her smoking stating that we "can't take away her food in her cigarettes". She has been compliant with offloading using the PEG assist you. She is using Santyl daily. the culture obtained last week grew staph aureus and Enterococcus faecalis; continues on the doxycycline and Augmentin was added on Monday. The suprapubic area has erythema, no femoral variation, purple discoloration, minimal induration, was accessed with [name] cotton tip applicator with sanguinopurulent drainage, this was cultured, I suspect the current antibiotic treatment will cover and we will not add anything to her current treatment plan. She was advised to go to urgent care or ER with any change in redness, induration or fever. 02/01/18-She is here in follow-up evaluation for left great toe ulcers and [name] new abdominal abscess from last week. She was able to use packing until earlier this week, where she "forgot it was there". She states she was feeling ill  with GI symptoms last week and was not taking her antibiotic. She states her glucose levels have been predominantly less than 200, with occasional levels between 200-250. She thinks this was contributing to her GI symptoms as they have resolved without intervention. There continues to be significant laceration to left toe, otherwise it clinically looks stable/improved. There is now less superficial opening to the lateral aspect of the great toe that was residual blister. We will transition to [facility] to all wounds, she will continue her Augmentin. If there is no change or deterioration next week for reculture. 02/08/18-She is here in follow-up evaluation for left great toe ulcer and abdominal ulcer. There is an improvement in both wounds. She has been wrapping her left toe with coban, not by  our direction, which has created an area of discoloration to the medial aspect; she has been advised to NOT use coban secondary to her neuropathy. She states her glucose levels have been high over this last week ranging from 200-350, she continues to smoke. She admits to being less [name], [name] ([id number]) [id number] Page 13 of 21 compliant with her offloading shoe. We will continue with same treatment plan and she will follow-up next week. 02/15/18-She is here in follow-up evaluation for left great toe ulcer and abdominal ulcer. The abdominal ulcer is epithelialized. The left great toe ulcer is improved and all injury from last week using the Coban wrap is resolved, the lateral ulcer is healed. She admits to noncompliance with wearing offloading shoe and admits to glucose levels being greater than 300 most of the week. She continues to smoke and expresses no desire to quit. There is one area medially that probes deeper than it has historically, erythema to the toe and dorsal foot has consistently waxed and waned. There is no overt signs of cellulitis or infection but we will  culture the wound for any occult infection given the new area of depth and erythema. We will hold off on sensitivities for initiation of antibiotic therapy. 02/22/18-She is here in follow up evaluation for left great toe ulcer. There is overall significant improvement in both wound appearance, erythema and edema with changes made last week. She was not initiated on antibiotic therapy. Culture obtained last week showed oxacillin sensitive staph aureus, sensitive to clindamycin. Clindamycin has been called into the pharmacy but she has been instructed to hold off on initiation secondary to overall clinical improvement and her history of antibiotic intolerance. She has been instructed to contact the clinic with any noted changes/deterioration and the wound, erythema, edema and/or pain. She will follow-up next week. She continues to smoke and her glucose levels remain elevated >250; she admits to compliance with offloading shoe 03/01/18 on evaluation today patient appears to be doing fairly well in regard to her left first toe ulcer. She has been tolerating the dressing changes with the [facility] Dressing without complication and overall this has definitely showed signs of improvement according to records as well is what the patient tells me today. I'm very pleased in that regard. She is having no pain today 03/08/18 She is here for follow up evaluation of [name] left great toe ulcer. She remains non-compliant with glucose control and smoking cessation; glucose levels consistently >200. She states that she got new shoe inserts/peg assist. She admits to compliance with offloading. Since my last evaluation there is significant improvement. We will switch to prisma at this time and she will follow up next week. She is noted to be tachycardic at this appointment, heart rate 120s; she has [name] history of heart rate 70-130 according to our records. She admits to extreme agitation r/t personal issues; she was advised to  monitor her heartrate and contact her physician if it does not return to [name] more normal range (<100). She takes cardizem twice daily. 03/15/18-She is here in follow-up evaluation for left great toe ulcer. She remains noncompliant with glucose control and smoking cessation. She admits to compliance with wearing offloading shoe. The ulcer is improved/stable and we will continue with the same treatment plan and she will follow-up next week 03/22/18-She is here for evaluation for left great toe ulcer. There continues to be significant improvement despite recurrent hyperglycemia (over 500 yesterday) and she continues to smoke. She has been compliant  with offloading and we will continue with same treatment plan and she will follow-up next week. 03/29/18-She is here for evaluation for left great toe ulcer. Despite continuing to smoke and uncontrolled diabetes she continues to improve. She is compliant with offloading shoe. We will continue with the same treatment plan and she will follow-up next week 04/05/18- She is here in follow up evaluation for [name] left great toe ulcer; she presents with small pustule to left fifth toe (resembles ant bite). She admits to compliance with wearing offloading shoe; continues to smoke or have uncontrolled blood glucose control. There is more callus than usual with evidence of bleeding; she denies known trauma. 04/12/18-She is here for evaluation of left great toe ulcer. Despite noncompliance with glycemic control and smoking she continues to make improvement. She continues to wear offloading shoe. The pustule, that was identified last week, to the left fifth toe is resolved. She will follow-up in 2 weeks 05/03/18-she is seen in follow-up evaluation for [name] left great toe ulcer. She is compliant with offloading, otherwise noncompliant with glycemic control and smoking. She has plateaued and there is minimal improvement noted. We will transition to [facility], replaced the insert to  her surgical shoe and she will follow-up in one week 05/10/18- She is here in follow up evaluation for [name] left great toe ulcer. It appears stable despite measurement change. We will continue with same treatment plan and follow up next week. 05/24/18-She is seen in follow-up evaluation for [name] left great toe ulcer. She remains compliant with offloading, has made significant improvement in her diet, decreasing the amount of sugar/soda. She said her recent A1c was 10.9 which is lower than. She did see [name] diabetic nutritionist/educator yesterday. She continues to smoke. We will continue with the same treatment plan and she'll follow-up next week. 05/31/18- She is seen in follow-up evaluation for left great toe ulcer. She continues to remain compliant with offloading, continues to make improvement in her diet, increasing her water and decreasing the amount of sugar/soda. She does continue to smoke with no desire to quit. We will apply Prisma to the depth and Hydrofera Blue over. We have not received insurance authorization for oasis. She will follow up next week. 06/07/18-She is seen in follow-up evaluation for left great toe ulcer. It has stalled according to today's measurements although base appears stable. She says she saw [name] diabetic educator yesterday; her average blood sugars are less than 300 which is an improvement for her. She continues to smoke and states "that's my next step" She continues with water over soda. We will order for xray, culture and reinstate ace wrap compression prior to placing apligraf for next week. She is voicing no complaints or concerns. Her dressing will change to iodoflex over the next week in preparation for apligraf. 06/14/18-She is seen in follow-up evaluation for left great toe ulcer. Plain film x-ray performed last week was negative for osteomyelitis. Wound culture obtained last week grew strep B and OSSA; she is initiated on keflex and cefdinir today; there is erythema to the  toe which could be from ace wrap compression, she has [name] history of wrapping too tight and has has been encouraged to maintain ace wraps that we place today. We will hold off on application of apligraf today, will apply next week after antibiotic therapy has been initiated. She admits today that she has resumed taking [name] shower with her foot/toe submerged in water, she has been reminded to keep foot/toe out of the  bath water. She will be seen in follow up next week 06/21/18-she is seen in follow-up evaluation for left great toe ulcer. She is tolerating antibiotic therapy with no GI disturbance. The wound is stable. Apligraf was applied today. She has been decreasing her smoking, only had 4 cigarettes yesterday and 1 today. She continues being more compliant in diabetic diet. She will follow-up next week for evaluation of site, if stable will remove at 2 weeks. 06/28/18- She is here in follow up evalution. Apligraf was placed last week, she states the dressing fell off on Tuesday and she was dressing with hydrofera blue. She is healed and will be discharged from the clinic today. She has been instructed to continue with smoking cessation, continue monitoring glucose levels, offloading for an additional 4 weeks and continue with hydrofera blue for additional two weeks for any possible microscopic opening. Readmission: 08/07/18 on evaluation today patient presents for reevaluation concerning the ulcer of her right great toe. She was previously discharged on 06/28/18 healed. Nonetheless she states that this began to show signs of drainage she subsequently went to her primary care provider. Subsequently an x-ray was performed on 08/01/18 which was negative. The patient was also placed on antibiotics at that time. Fortunately they should have been effective for the infection. Nonetheless she's been experiencing some improvement but still has [name] lot of drainage coming from the wound itself. 08/14/18 on evaluation  today patient's wound actually does show signs of improvement in regard to the erythema at this point. She has completed the antibiotics. With that being said we did discuss the possibility of placing her in [name] total contact cast as of today although I think that I may want to give this just [name] little bit more time to ensure nothing recurrence as far as her infection is concerned. I do not want to put in the cast and risk infection at that time if things are not completely resolved. With that being said she is gonna require some debridement today. 08/21/18 on evaluation today patient actually appears to be doing okay in regard to her toe ulcer. She's been tolerating the dressing changes without complication. With that being said it does appear that she is ready and in fact I think it's appropriate for [place] to go ahead and initiate the total contact cast today. Nonetheless she will require some sharp debridement to prepare the wound for application. Overall I feel like things have been progressing well but we do need to do something to get this to close more readily. 08/24/18 patient seen today for reevaluation after having had the total contact cast applied on Tuesday. She seems to have done very well the wound appears to be doing great and overall I'm pleased with the progress that she's made. There were no abnormal areas of rubbing from the cast on her lower extremity. 08/30/18 on evaluation today patient actually appears to be completely healed in regard to her plantar toe ulcer. She tells me at this point she's been having [name] lot of issues with the cast. She almost fell [name] couple of times the state shall the step of her dog [name] couple times as well. This is been [name] very frustrating process for her other nonetheless she has completely healed the wound which is excellent news. Overall there does not appear to be the evidence of infection at this time which is great news. 09/11/18 evaluation today patient  presents for follow-up concerning her great toe ulcer on the left which has  unfortunately reopened since I last saw her which was only [name] couple of weeks ago. Unfortunately she was not able to get in to get the shoe and potentially the AFO that's gonna be necessary due to her left foot drop. She continues with offloading shoe but this is not enough to prevent her from reopening it appears. When we last had her in the total contact cast she did well from [name] healing standpoint but unfortunately the wound reopened as soon as she came out of the cast within just [name] couple of weeks. 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 This is also part of the reason why she falls on [name] regular basis. I just do not believe that is gonna be safe for [place] to reinitiate the total contact cast as last time we had this on she fell 3 times one week which is definitely not normal for her. CYANNE, DELMAR [name] ([id number]) [id number] Page 14 of 21 09/18/18 upon evaluation today the patient actually appears to be doing about the same in regard to her toe ulcer. She did not contact [employer] as I asked her to even though I had given her the prescription. In fact she actually states that she has no idea where the prescription is. She did apparently call [employer] and they told her that all she needed to do was bring the prescription in order to be able to be seen and work on getting the AFO for her left foot. With all that being said she still does not have an appointment and I'm not sure were things stand that regard. I will give her [name] new prescription today in order to contact them to get this set up. 09/25/18 on evaluation today patient actually appears to be doing about the same in regard to her toes ulcer. She does  have [name] small areas which seems to have [name] lot of callous buildup around the edge of the wound which is going to need sharp debridement today. She still is waiting to be scheduled for evaluation with [employer] for possibility of an AFO. She states there supposed to call her tomorrow to get this set up. Unfortunately it does appear that her foot specifically the toe area is showing signs of erythema. There does not appear to be any systemic infection which is in these good news. 10/02/18 on evaluation today patient actually appears to be doing about the same in regard to her toe ulcer. This really has not done too well although it's not significantly larger it's also not significantly smaller. She has been tolerating the dressing changes without complication. She actually has her appointment with [employer] and [place] tomorrow to hopefully be measured for obtaining and AFO splint. I think this would be helpful preventing this from reoccurring. We had contemplated starting the cast this week although to be honest I am reluctant to do that as she's been having nausea, vomiting, and seizure activity over the past three days. She has [name] history of seizures and have been told is nothing that can be done for these. With that being said I do believe that along with the seizures have the nausea vomiting which upon further questioning doesn't seem to be the normal for her and makes me concerned for the possibility of infection  or something else going on. I discussed this with the patient and her mother during the office visit today. I do not feel the wound is effective but maybe something else. The responses this was "this just happens to her at times and we don't know why". They did not seem to be interested in going to the hospital to have this checked out further. 10/09/18 on evaluation today patient presents for follow-up concerning her ongoing toe ulcer. She has been tolerating the dressing changes  without complication. Fortunately there does not appear to be any evidence of infection which is great news however I do think that the patient would benefit from going ahead for with the total contact cast. She's actually in [name] wheelchair today she tells me that she will use her walker if we initiate the cast. I was very specific about the fact that if we were gonna do the cast I wanted to make sure that she was using the walker in order to prevent any falls. She tells me she does not have stairs that she has to traverse on [name] regular basis at her home. She has not had any seizures since last week again that something that happens to her often she tells me she did talk to [employer] and they said that it may take up to three weeks to get the brace approved for her. Hopefully that will not take that long but nonetheless in the meantime I do think the cast could be of benefit. 10/12/18 on evaluation today patient appears to be doing rather well in regard to her toe ulcer. It's just been [name] few days and already this is significantly improved both as far as overall appearance and size. Fortunately there's no sign of infection. She is here for her first obligatory cast change. 10/19/18 Seen today for follow up and management of left great toe ulcer. Wound continues to show improvement. Noted small open area with seroussang drainage with palpation. Denies any increased pain or recent fevers during visit. She will continue calcium alginate with offloading shoe. Denies any questions or concerns during visit. 10/26/18 on evaluation today patient appears to be doing about the same as when I last saw her in regard to her wound bed. Fortunately there does not appear to be any signs of infection. Unfortunately she continues to have [name] breakdown in regard to the toe region any time that she is not in the cast. It takes almost no time at all for this to happen. Nonetheless she still has not heard anything from the brace  being made by [employer] as to when exactly this will be available to her. Fortunately there is no signs of infection at this time. 10/30/18 on evaluation today patient presents for application of the total contact cast as we just received him this morning. Fortunately we are gonna be able to apply this to her today which is great news. She continues to have no significant pain which is good news. Overall I do feel like things have been improving while she was the cast is when she doesn't have [name] cast that things get worse. She still has not really heard anything from [place] regarding her brace. 11/02/18 upon evaluation today patient's wound already appears to be doing significantly better which is good news. Fortunately there does not appear to be any signs of infection also good news. Overall I do think the total contact cast as before is helping to heal this area unfortunately it's just not gonna likely  keep the area closed and healed without her getting her brace at least. Again the foot drop is [name] significant issue for her. 11/09/18 on evaluation today patient appears to be doing excellent in regard to her toe ulcer which in fact is completely healed. Fortunately we finally got the situation squared away with the paperwork which was needed to proceed with getting her brace approved by Medicaid. I have filled that out unfortunately that information has been sent to the orthopedic office that I worked at 2 1/2 years ago and not tired Current wound care measures. Fortunately she seems to be doing very well at this time. 11/23/18 on evaluation today patient appears to be doing More Poorly Compared to Last Time I Saw Her. At [facility] She Had Completely Healed. Currently she is continuing to have issues with reopening. She states that she just found out that the brace was approved through Medicaid now she just has to go get measured in order to have this fitted for her and then made. Subsequently she does not  have an appointment for this yet that is going to complicate things we obviously cannot put her back in the cast if we do not have everything measured because they're not gonna be able to measure her foot while she is in the cast. 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 Obviously this is unfortunate and does have me somewhat worried as well. 11/30/18 on evaluation today patient's toe ulcer actually appears to be doing fairly well. The good news is she will be getting her brace in the shoes next week on Wednesday. Hopefully we will be able to get this to heal without having to go back in the cast however she may need the cast in order to get the wound completely heal and then go from there. Fortunately there's no signs of infection at this time. 12/07/18 on evaluation today patient fortunately did receive her brace and she states she could tell this definitely makes her walk better. With that being said she's been having issues with her toe where she noticed yesterday there was [name] lot of tissue that was loosing off this appears to be much larger than what it was previous. She also states that her leg has been read putting much across the top of her foot just about the ankle although this seems to be receiving somewhat. The total area is still red and appears to be someone infected as best I can tell. She is previously taken Bactrim and that may be [name] good option for her today as well. We are gonna see what I wound culture shows as well and I think that this is definitely appropriate. With that being said outside of the culture I still need to initiate something in the interim and that's what I'm gonna go ahead and select Bactrim is [name] good option for her. 12/14/18 on evaluation today patient appears to be doing better in regard to her left great toe ulcer as compared to last week's evaluation. There's still  some erythema although this is significantly improved which is excellent news. Overall I do believe that she is making good progress is still gonna take some time before she is where I would like her to be from the standpoint of being able to place her back into the total contact cast. Hopefully we will be where we need to  be by next week. 12/21/18 on evaluation today patient actually appears to be doing poorly in regard to her toe ulcer. She's been tolerating the dressing changes without complication. Fortunately there's no signs of systemic infection although she does have [name] lot of drainage from the toe ulcer and this does seem to be causing some issues at this point. She does have erythema on the distal portion of her toe that appears to be likely cellulitis. 12/28/18 on evaluation today patient actually appears to be doing [name] little better in my pinion in regard to her toe ulcer. With that being said she still does have some evidence of infection at this time and for her culture she had both E. coli as well as enterococcus as organisms noted on evaluation. For that reason I think that though the Keflex likely has treated the E. coli rather well this has really done nothing for the enterococcus. We are going to have to initiate treatment for this specifically. 01/04/19 on evaluation today patient's toe actually appears to be doing better from the standpoint of infection. She currently would like to see about putting the cash back on I think that this is appropriate as long as she takes care of it and keeps it from getting wet. She is gonna have some drainage we can definitely pass this up with Drawtex and alginate to try to prevent as much drainage as possible from causing the problems. With that being said I do want to at least try her with the cast between now and Tuesday. If there any issues we can't continue to use it then I will discontinue the use of the cast at that point. [name], [name]  ([id number]) [id number] Page 15 of 21 01/08/19 on evaluation today patient actually appears to be doing very well as far as her foot ulcer specifically the great toe on the left is concerned. She did have an area of rubbing on the medial aspect of her left ankle which again is from the cast. Fortunately there's no signs of infection at this point in this appears to be [name] very slight skin breakdown. The patient tells me she felt it rubbing but didn't think it was that bad. Fortunately there is no signs of active infection at this time which is good news. No fevers, chills, nausea, or vomiting noted at this time. 01/15/19 on evaluation today patient actually appears to be doing well in regard to her toe ulcer. Again as previous she seems to do well and she has the cast on which indicates to me that during the time she doesn't have [name] cast on she's putting way too much pressure on this region. Obviously I think that's gonna be an issue as with the current national emergency concerning the Covid-19 Virus it has been recommended that we discontinue the use of total contact casting by the chief medical officer of our company, Dr. Simona Huh. The reasoning is that if [name] patient becomes sick and cannot come into have the cast removed they could not just leave this on for an additional two weeks. Obviously the hospitals also do not want to receive patient's who are sick into the emergency department to potentially contaminate the region and spread the Covid-19 Virus among other sick individuals within the hospital system. Therefore at this point we are suspending the use of total contact cast until the current emergency subsides. This was all discussed with the patient today as well. 01/22/19 on evaluation today patient's wound on her left  great toe appears to be doing slightly worse than previously noted last week. She tells me that she has been on this quite [name] bit in fact she tells me she's  been awake for 38 straight hours. This is due to the fact that she's having to care for grandparents because nobody else will. She has been taking care of them for five the last seven days since I've seen her they both have dementia his is from [name] stroke and her grandmother's was progressive. Nonetheless she states even her mom who knows her condition and situation has only help two of those days to take care of them she's been taking care of the rest. Fortunately there does not appear to be any signs of active infection in regard to her toe at this point although obviously it doesn't look as good as it did previous. I think this is directly related to her not taking off the pressure and friction by way of taking things easy. Though I completely understand what's going on. 01/29/19 on evaluation today patient's tools are actually appears to be showing some signs of improvement today compared to last week's evaluation as far as not necessarily the overall size of the wound but the fact that she has some new skin growth in between the two ends of the wound opening. Overall I feel like she has done well she states that she had [name] family member give her what sounds to be [name] CAM walker boot which has been helpful as well. 02/05/19 on evaluation today patient's wound bed actually appears to be doing significantly better in regard to her overall appearance of the size of the wound. With that being said she is still having an issue with offloading efficiently enough to get this to close. Apparently there is some signs of infection at this point as well unfortunately. Previously she's done well of Augmentin I really do not see anything that needs to be culture currently but there theme and cellulitis of the foot that I'm seeing I'm gonna go ahead and place her on an antibiotic today to try to help clear this up. 02/12/2019 on evaluation today patient actually appears to be doing poorly in regard to her overall wound  status. She tells me she has been using her offloading shoe but actually comes in today wearing her tennis shoe with the AFO brace. Again as I previously discussed with her this is really not sufficient to allow the area to heal appropriately. Nonetheless she continues to be somewhat noncompliant and I do wonder based on what she has told my nurse in the past as to whether or not [name] good portion of this noncompliance may be recreational drug and alcohol related. She has had [name] history of heroin overdose and this was fairly recently in the past couple of months that have been seeing her. Nonetheless overall I feel like her wound looks significantly worse today compared to what it was previous. She still has significant erythema despite the Augmentin I am not sure that this is an appropriate medication for her infection I am also concerned that the infection may have gone down into her bone. 02/19/19 on evaluation today patient actually appears to be doing about the same in regard to her toe ulcer. Unfortunately she continues to show signs of bone exposure and infection at this point. There does not appear to be any evidence of worsening of the infection but I'm also not really sure that it's getting significantly better. She  is on the Augmentin which should be sufficient for the Staphylococcus aureus infection that she has at this point. With that being said she may need IV antibiotics to more appropriately treat this. We did have [name] discussion today about hyperbaric option therapy. 02/28/19 on evaluation today patient actually appears to be doing much worse in regard to the wound on her left great toe as compared to even my previous evaluation last week. Unfortunately this seems to be training in [name] pretty poor direction. Her toe was actually now starting to angle laterally and I can actually see the entire joint area of the proximal portion of the digit where is the distal portion of the digit again is no  longer even in contact with the joint line. Unfortunately there's [name] lot more necrotic tissue around the edge and the toe appears to be showing signs of becoming gangrenous in my pinion. I'm very concerned about were things stand at this point. She did see infectious disease and they are planning to send in [name] prescription for Sivextro for her and apparently this has been approved. With that being said I don't think she should avoid taking this but at the same time I'm not sure that it's gonna be sufficient to save her toe at this point. She tells me that she still having to care for grandparents which I think is putting quite [name] bit of strain on her foot and specifically the total area and has caused this to break down even to [name] greater degree than would've otherwise been expected. 03/05/19 on evaluation today patient actually appears to be doing quite well in regard to her toe all things considering. She still has bone exposed but there appears to be much less your thing on overall the appearance of the wound and the toe itself is dramatically improved. She still does have some issues currently obviously with infection she did see vascular as well and there concerned that her blood flow to the toad. For that reason they are setting up for an angiogram next week. 03/14/19 on evaluation today patient appears to be doing very poor in regard to her toe and specifically in regard to the ulceration and the fact that she's starting to notice the toe was leaning even more towards the lateral aspect and the complete joint is visible on the proximal aspect of the joint. Nonetheless she's also noted [name] significant odor and the tip of the toe is turning more dark and necrotic appearing. Overall I think she is getting worse not better as far as this is concerned. For that reason I am recommending at this point that she likely needs to be seen for likely amputation. READMISSION 03/19/2021 This is [name] patient that we  cared for in this clinic for [name] prolonged period of time in 2019 and 2020 with [name] left foot and left first toe wound. 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 Luana Shu. In December 2021 she had an ulcer on her right great toe as well as the fourth and fifth toes. 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 She also had an angiogram at that time and underwent angioplasty of the right anterior tibial artery. In any case she claims that the wound on the right foot is closed I did not look at this today which was probably an oversight although I  think that should be done next week. 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 According to Dr. Deborra Medina last review that she was out of the area being cared for by another physician but recently came back to his attention. 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 [name] culture of this area showed E. coli apparently before she came back to see Dr. Luana Shu she was supposed to be receiving antibiotics but she did not really take them. Nor is she offloading this area at all. Finally her last hemoglobin A1c listed in epic was in March 2022 at 14.1 she says things are [name] lot better since then although I am not sure. 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 She had this wound at the time. During this admission she had E. coli sepsis [name] TEE was negative. 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 She is still [name] heavy cigarette smoker.  She has [name] history of bipolar disorder seizure disorder ABI in our clinic was 1.05 6/1; left midfoot in the setting of [name] TMA done previously. Round circular wound with [name] "knuckle" of protruding tissue. The problem is that the knuckle was not attached to any of the surrounding granulation and this probed proximally widely I removed [name] large portion of this tissue. This wound goes with considerable undermining laterally. I do not feel any bone there was no purulence but this is [name] deep wound. 6/8; in spite of the debridement I did last week. She arrives with [name] wound looking exactly the same. [name] protruding "knuckle" of tissue nonadherent to most of the surrounding tissue. There is considerable depth around this from 6-12 o'clock at 2.7 cm and undermining of 1 cm. This does not look overtly infected and the x- ray I did last week was negative for any osseous abnormalities. We have been using silver collagen 6/15; deep tissue culture I did last week showed moderate staph aureus and moderate Pseudomonas. This will definitely require prolonged antibiotic therapy. The pathology on the protuberant area was negative for malignancy fungus etc. the comment was chronic ulceration with exuberant fibrin necrotic debris and [name], [name] ([id number]) [id number] Page 16 of 21 negative for malignancy. We have been using silver collagen. I am going to be prescribing Levaquin for 2 weeks. Her CT scan of the foot is down for 7/5 6/22; CT scan of the foot on 7 5. She says she has hardware in the left leg from her previous fracture. She is on the Levaquin for the deep tissue culture I did that showed methicillin sensitive staph aureus and Pseudomonas. I gave her [name] 2-week supply and she will have another week. She arrives in clinic today with the same protuberant tissue however this is nonadherent to the tissue surrounding it. I am really at [name] loss to explain this unless there is underlying deep  tissue infection 6/29; patient presents for 1 week follow-up. She has been using collagen to the wound bed. She reports taking her antibiotics as prescribed.She has no complaints or issues today. She denies signs of infection. 7/6; patient presents for one week followup. She has been using collagen to the wound bed. She states she  is taking Levaquin however at times she is not able to keep it down. She denies signs of infection. 7/13; patient presents for 1 week follow-up. She has been using silver alginate to the wound bed. She still has nausea when taking her antibiotics. She denies signs of infection. 7/20; patient presents for 1 week follow-up. She has been using silver alginate with gentamicin cream to the wound bed. She denies any issues and has no complaints today. She denies signs of infection. 7/27; patient presents for 1 week follow-up. She continues to use silver alginate with gentamicin cream to the wound bed. She reports starting her antibiotics. She has no issues or complaints. Overall she reports stability to the wound. 8/3; patient presents for 1 week follow-up. She has been using silver alginate with gentamicin cream to the wound bed. She reports completing all antibiotics. She has no issues or complaints today. She denies signs of infection. 8/17; patient presents for 2-week follow-up. He is to use silver alginate to the wound bed. She has no issues or complaints today. She denies signs of infection. She reports her pain has improved in her foot since last clinic visit 8/24; patient presents for 1 week follow-up. She continues to use silver alginate to the wound bed. She has no issues or complaints. She denies signs of infection. Pain is stable. 9/7; patient presents for follow-up. She missed her last week appointment due to feeling ill. She continues to use silver alginate. She has [name] new wound to the right lower extremity that is covered in eschar. She states It occurred over  the past week and has no idea how it started. She currently denies signs of infection. 9/14; patient presents for follow-up. T the left foot wound she has been using gentamicin cream and silver alginate. T the right lower extremity wound she has o o been keeping this covered and has not obtain Santyl. 9/21; patient presents for follow-up. She reports using gentamicin cream and silver alginate to the left foot and Santyl to the right lower extremity wound. She has no issues or complaints today. She denies signs of infection. 9/28; patient presents for follow-up. She reports [name] new wound to her right heel. She states this occurred [name] few days ago and is progressively gotten worse. She has been trying to clean the area with [name] Q-tip and Santyl. She reports stability in the other 2 wounds. She has been using gentamicin cream and silver alginate to the left foot and Santyl to the right lower extremity wound. 10/12; patient presents for follow-up. She reports improvement to the wound beds. She is seeing vein and vascular to discuss the potential of [name] left BKA. She states they are going to do an arteriogram. She continues to use silver alginate with dressing changes to her wounds. 11/2; patient presents for follow-up. She states she has not been doing dressing changes to the wound beds. She states she is not able to offload the areas. She reports chronic pain to her left foot wound. 11/9; patient presents for follow-up. She came in with only socks on. She states she forgot to put on shoes. It is unclear if she is doing any dressing changes. She currently denies systemic signs of infection. 11/16; patient presents for follow-up. She came again only with socks on. She states she does not wear shoes ever. It is unclear if she does dressing changes. She currently denies systemic signs of infection. 11/23; patient presents for follow-up. She wore her shoes today. It still  unclear exactly what dressing she is  using for each wound but she did states she obtained Dakin's solution and has been using this to the left foot wound. She currently denies signs of infection. 11/30; patient presents for follow-up. She has no issues or complaints today. She currently denies signs of infection. 12/7; patient presents for follow-up. She has no issues or complaints today. She has been using Hydrofera Blue to the right heel wound and Dakin solution to the left foot wound. Her right anterior leg wound is healed. She currently denies signs of infection. 12/14; patient presents for follow-up. She has been using Hydrofera Blue to the right heel and Dakin's to the left foot wounds. She has no issues or complaints today. She denies signs of infection. 12/21; patient presents for follow-up. She reports using Hydrofera Blue to the right heel and Dakin's to the left foot wound. She denies signs of infection. 12/28; patient presents for follow-up. She continues to use Dakin's to the left foot wound and Hydrofera Blue to the right heel wound. She denies signs of infection. 1/4; patient presents for follow-up. She has no issues or complaints today. She denies signs of infection. 1/11; patient presents for follow-up. It is unclear if she has been dressing these wounds over the past week. She currently denies signs of infection. 1/18; patient presents for follow-up. She states she has been using Dakin's wet-to-dry dressings to the left foot. She has been using Hydrofera Blue to the right foot foot wound. She states that the anterior right leg wound has reopened and draining serous fluid. She denies signs of infection. 1/25; patient presents for follow-up. She has no issues or complaints today. 2/1; patient presents for follow-up. She has no issues or complaints today. She denies signs of infection. 2/8; patient presents for follow-up. She has lost her surgical shoes. She did not have [name] dressing to the right heel wound. She currently  denies signs of infection. 2/15; patient presents for follow-up. She reports more pain to the right heel today. She denies purulent drainage Or fever/chills 2/22; patient presents for follow-up. She reports taking clindamycin over the past week. She states that she continues to have pain to her right heel. She reports purulent drainage. Readmission 03/16/2022 Ms. Phyillis Dascoli is [name] 47 year old female with [name] past medical history of type 2 diabetes, osteomyelitis to her feet, chronic systolic heart failure and bipolar disorder that presents to the clinic for bilateral feet wounds and right lower extremity wound. She was last seen in our clinic on 12/15/2021. At that time she had purulent drainage coming out of her right plantar foot and I recommended she go to the ED. She states she went to [facility] and has been there for the past 3 months. I cannot see the records. She states she had OR debridement and was on several weeks of IV antibiotics while inpatient. Since discharge she has not been taking care of the wound beds. She had nothing on her feet other than socks today. She currently denies signs of infection. 5/31; patient presents for follow-up. She has been using Dakin's wet-to-dry dressings to the wound beds on her feet bilaterally and antibiotic ointment to the right anterior leg wound. She had [name] wound culture done at last clinic visit that showed moderate Pseudomonas aeruginosa sensitive to ciprofloxacin. She currently denies systemic signs of infection. 6/14; patient presents for follow-up. She received Keystone 5 days ago and has been using this on the wound beds. She states that  last week she had to go to [name] ([id number]) [id number] Page 17 of 21 the hospital because she had increased warmth and erythema to the right foot. She was started on 2 oral antibiotics. She states she has been taking these. She currently denies systemic signs of  infection. She has no issues or complaints today. 6/21; patient presents for follow-up. She states she has been using Keystone antibiotics to the wound beds. She has no issues or complaints today. She denies signs of infection. 6/28; patient presents for follow-up. She has been using Keystone antibiotics to the wound beds. She has no issues or complaints today. 7/12; patient presents for follow-up. Has been using Keystone antibiotics to the wound beds with calcium alginate. She has no issues or complaints today. She never followed up with her orthopedic surgeon who did the OR debridement to the right foot. We discussed the total contact cast for the left foot and patient would like to do this next week. 7/19; patient presents for follow-up. She has been using Keystone antibiotics with calcium alginate to the wound beds. She has no issues or complaints today. Patient is in agreement to do the total contact cast of the left foot today. She knows to return later this week for the obligatory cast change. 05-13-2022 upon evaluation today patient's wound which she has the cast of the left leg actually appears to be doing significantly better. Fortunately I do not see any signs of active infection locally or systemically which is great news and overall I am extremely pleased with where we stand currently. 7/26; patient presents for follow-up. She has [name] cast in place for the past week. She states it irritated her shin. Other than that she tolerated the cast well. She states she would like [name] break for 1 week from the cast. We have been using Keystone antibiotic and Aquacel to both wound beds. She denies signs of infection. 8/2; patient presents for follow-up. She has been using Keystone and Aquacel to the wound beds. She denies any issues and has no complaints. She is agreeable to have the cast placed today for the left leg. 06-03-2022 upon evaluation today patient appears to be doing well with regard to her  wound she saw some signs of improvement which is great news. Fortunately I do not see any evidence of active infection locally or systemically at this time which is great news. No fevers, chills, nausea, vomiting, or diarrhea. 8/16; patient presents for follow-up. She has no issues or complaints today. We have been using Keystone and Aquacel to the wound beds. The left lower extremity is in [name] total contact cast. She is tolerated this well. 8/23; patient presents for follow-up. She has had the total contact cast on the left leg for the past week. Unfortunately this has rubbed and broken down the skin to the medial foot. She currently denies signs of infection. She has been using Keystone antibiotic to the right plantar foot wound. 8/30; patient presents for follow-up. We have held off on the total contact cast for the left leg for the past week. Her wound on the left foot has improved and the previous surrounding breakdown of skin has epithelialized. She has been using Keystone antibiotic to both wound beds. She has no issues or complaints today. She denies signs of infection. 9/6; patient presents for follow-up. She has ordered her's Keystone antibiotic and this is arriving this week. She has been doing Dakin's wet-to-dry dressings to the wound beds. She denies  signs of infection. She is agreeable to the total contact cast today. 9/13; patient presents for follow-up. She states that the cast caused her left leg shin to be sore. She would like to take [name] break from the cast this week. She has been using Keystone antibiotic to the right plantar foot wound. She denies signs of infection. 9/20; patient presents for follow-up. She has been using Keystone antibiotics to the wound beds with calcium alginate to the right foot wound and Hydrofera Blue to the left foot wound. She is agreeable to having the cast placed today. She has been approved for Apligraf and we will order this for next clinic visit. 9/27;  patient presents for follow-up. We have been using Keystone antibiotic with Hydrofera Blue to the left foot wound under [name] total contact cast. T the right o foot wound she has been using Keystone antibiotic and calcium alginate. She declines [name] total contact cast today. Apligraf is available for placement and she would like to proceed with this. 07-28-2022 upon evaluation today patient appears to be doing well currently in regard to her wound. She is actually showing signs of significant improvement which is great news. Fortunately I do not see any evidence of active infection locally nor systemically at this time. She has been seeing Dr. Heber V[place]en[place] and to be honest has been doing very well with the cast. Subsequently she comes in today with [name] cast on and we did reapply that today as well. She did not really want to she try to talk me out of that but I explained that if she wanted to heal this is really the right way to go. Patient voiced understanding. In regard to her right foot this is actually [name] lot better compared to the last time I saw her which is also great news. 10/11; patient presents for follow-up. Apligraf and the total contact cast was placed to the left leg at last clinic visit. She states that her right foot wound had burning pain to it with the placement of Apligraf to this area. She has been doing [place] over this area. She denies signs of infection including increased warmth, erythema or purulent drainage. 11/1; 3-week follow-up. The patient fortunately did not have [name] total contact cast or an Apligraf and on the left foot. She has been using Keystone ABD pads and kerlix and her own running shoes She arrives in clinic today with thick callus and [name] very poor surface on the left foot on the right nonviable skin subcutaneous tissue and [name] deep probing hole. 11/15; patient missed her last clinic appointment. She states she has not been dressing the wound beds for the past 2 weeks. She states  that at she had [name] new roommate but is now going back to live with her mother. Apparently its been [name] distracting 2 weeks. Patient currently denies signs of infection. 11/22; patient presents for follow-up. She states she has been using Keystone antibiotic and Dakin's wet-to-dry dressings to the wound beds. She is agreeable for cast placement today. We had ordered Apligraf however this has not been received by our facility. 11/29; Patient had [name] total contact cast placed at last clinic visit and she tolerated this well. We were using silver alginate under the cast. Patient's been using Keystone antibiotic with Aquacel to the right plantar foot wound. She has no issues or complaints today. Apligraf is available for placement today. Patient would like to proceed with this. 12/6; patient presents for follow-up. She had Apligraf placed  in standard fashion last clinic visit under the total contact cast to the left lower extremity. She has been using Keystone antibiotic and Aquacel to the right plantar foot wound. She has no issues or complaints today. 12/13; patient presents for follow-up. She has finished 5 Apligraf placements. Was told she would not qualify for more. We have been doing [name] total contact cast to the left lower extremity. She has been using Keystone antibiotic and Aquacel to the right plantar foot wound. She has no issues or complaints today. 12/20; patient presents for follow-up. We have been using Hydrofera Blue with Keystone antibiotic under [name] total contact cast of the left lower extremity. She reports using Keystone antibiotic and silver alginate to the right heel wound. She has no issues or complaints today. 12/27; patient presents with [name] healthy wound on the left midfoot. We have Apligraf to apply that to that more also using [name] total contact cast. On the right we are using Keystone and silver alginate. She is offloading the right heel with [name] surgical shoealthough by her admission she is on  her feet quite [name] bit 1/3; patient presents for follow-up. Apligraf was placed to the wound beds last clinic visit. She was placed in [name] total contact cast to the left lower extremity. She declines [name] total contact cast today. She states that her mother is in the hospital and she cannot adequately get around with the cast on. YESLI, VANDERHOFF [name] ([id number]) [id number] Page 18 of 21 1/10; patient presents for follow-up. She declined the total contact cast at last clinic visit. Both wounds have declined in appearance. She states that she has been on her feet and not offloading the wound beds. She currently denies signs of infection. 1/17; patient presents for follow-up. She had the total contact cast along with Apligraf placed last week to the left lower extremity. She tolerated this well. She has been using Aquacel Ag and Keystone antibiotic to the right heel wound. She currently denies signs of infection. She has no issues or complaints today. 1/24; patient presents for follow-up. We have been using Apligraf to the left foot wound along with [name] total contact cast. She has done well with this. T the right o heel wound she has been using Aquacel Ag and Keystone antibiotic ointment. She has no issues or complaints today. She denies signs of infection. 1/31; patient presents for follow-up. We have been using Apligraf to the left foot wound along with the total contact cast. She continues to do well with this. To the right heel we have been using Aquacel Ag and Keystone antibiotic ointment. She has no issues or complaints today. Objective Constitutional Vitals Time Taken: 11:20 AM, Height: 69 in, Weight: 178 lbs, BMI: 26.3, Temperature: 98.5 F, Pulse: 67 bpm, Respiratory Rate: 16 breaths/min, Blood Pressure: 123/75 mmHg. General Notes: Right foot: T the plantar heel there is an incision site with increased depth, No probing to bone. T the opening there is Nonviable tissue and o  o granulation tissue. Left foot: T the medial aspect there is an open wound with granulation tissue and nonviable tissue. No overt signs of infection to any of the o wound beds. Integumentary (Hair, Skin) Wound #11 status is Open. Original cause of wound was Surgical Injury. The date acquired was: 12/01/2021. The wound has been in treatment 36 weeks. The wound is located on the [place]. The wound measures 0.7cm length x 0.6cm width x 0.7cm depth; 0.33cm^2 area and 0.231cm^3 volume. There is Fat  Layer (Subcutaneous Tissue) exposed. There is [name] medium amount of serosanguineous drainage noted. The wound margin is distinct with the outline attached to the wound base. There is medium (34-66%) pale granulation within the wound bed. There is [name] medium (34-66%) amount of necrotic tissue within the wound bed. Wound #12 status is Open. Original cause of wound was Pressure Injury. The date acquired was: 03/16/2020. The wound has been in treatment 36 weeks. The wound is located on the [place]. The wound measures 1.5cm length x 0.7cm width x 0.1cm depth; 0.825cm^2 area and 0.082cm^3 volume. There is Fat Layer (Subcutaneous Tissue) exposed. There is [name] medium amount of serous drainage noted. The wound margin is flat and intact. There is small (1- 33%) red, pink granulation within the wound bed. There is [name] large (67-100%) amount of necrotic tissue within the wound bed including Adherent Slough. 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  I debrided nonviable tissue here. We will continue the course with the skin substitute and TCC. I debrided nonviable tissue to the right plantar heel wound. This wound is stable. I recommended continuing with Aquacel Ag and Keystone antibiotic ointment. Continue aggressive offloading with peg assist and surgical shoe here. Procedures Wound #11 Pre-procedure diagnosis of Wound #11 is an Open Surgical Wound located on the Right,Plantar Foot . There was [name] Excisional Skin/Subcutaneous Tissue/Muscle/Bone Debridement with [name] total area of 1 sq cm performed by Heather Shan, MD. With the following instrument(s): Curette to remove Viable and Non-Viable tissue/material. Material removed includes Cartilage, Subcutaneous Tissue, and Slough. [name] time out was conducted at 11:37, prior to the start of the procedure. [name] Minimum amount of bleeding was controlled with Pressure. The procedure was tolerated well. Post Debridement Measurements: 0.7cm length x 0.6cm width x 0.7cm depth; 0.231cm^3 volume. Character of Wound/Ulcer Post Debridement is stable. Post procedure Diagnosis Wound #11: Same as Pre-Procedure Wound #12 Pre-procedure diagnosis of Wound #12 is [name] Diabetic Wound/Ulcer of the Lower Extremity located on the Left,Medial,Plantar Foot .Severity of Tissue Pre Debridement is: Fat layer exposed. There was [name] Excisional Skin/Subcutaneous Tissue/Muscle/Bone Debridement with [name] total area of 1.05 sq cm performed by Heather Shan, MD. With the following instrument(s): Curette to remove Viable and Non-Viable tissue/material. Material removed includes Cartilage, Subcutaneous Tissue, and Slough. [name] time out was conducted at 11:39, prior to the start of the procedure. [name] Minimum amount of bleeding was controlled with Pressure. The procedure was tolerated well. Post Debridement Measurements: 1.5cm length x 0.7cm width x 0.1cm depth; 0.082cm^3 volume. Character of Wound/Ulcer Post Debridement is stable. Severity of Tissue  Post Debridement is: Fat layer exposed. Post procedure Diagnosis Wound #12: Same as Pre-Procedure [name], [name] ([id number]) [id number] Page 19 of 21 Pre-procedure diagnosis of Wound #12 is [name] Diabetic Wound/Ulcer of the Lower Extremity located on the Left,Medial,Plantar Foot. [name] skin graft procedure using [name] bioengineered skin substitute/cellular or tissue based product was performed by Heather Shan, MD with the following instrument(s): Blade and Scissors. Apligraf was applied and secured with Steri-Strips. 11 sq cm of product was utilized and 33 sq cm was  wasted due to wound size. Post Application, bolster was applied. [name] Time Out was conducted at 11:42, prior to the start of the procedure. The procedure was tolerated well. Post procedure Diagnosis Wound #12: Same as Pre-Procedure . Pre-procedure diagnosis of Wound #12 is [name] Diabetic Wound/Ulcer of the Lower Extremity located on the Left,Medial,Plantar Foot . There was [name] T Contact otal Cast Procedure by Heather Shan, MD. Post procedure Diagnosis Wound #12: Same as Pre-Procedure Plan Follow-up Appointments: Return Appointment in 1 week. Nurse Visit as needed Bathing/ Shower/ Hygiene: May shower with wound dressing protected with water repellent cover or cast protector. No tub bath. Anesthetic (Use 'Patient Medications' Section for Anesthetic Order Entry): Lidocaine applied to wound bed Cellular or Tissue Based Products: Cellular or Tissue Based Product Type: - Apligraf applied Cellular or Tissue Based Product applied to wound bed; including contact layer, fixation with steri-strips, dry gauze and cover dressing. (DO NOT REMOVE). Edema Control - Lymphedema / Segmental Compressive Device / Other: Elevate, Exercise Daily and Avoid Standing for Long Periods of Time. Elevate legs to the level of the heart and pump ankles as often as possible Elevate leg(s) parallel to the floor when sitting. Off-Loading: T  Contact Cast to Left Lower Extremity otal Open toe surgical shoe - Heel off loader - Right foot Other: - keep pressure off of feet. Additional Orders / Instructions: Follow Nutritious Diet and Increase Protein Intake Other: - Stay off of foot, wheelchair only Medications-Please add to medication list.: Wound #11 Right,Plantar Foot: Keystone Compound WOUND #11: - Foot Wound Laterality: Plantar, Right Cleanser: Wound Cleanser 1 x Per Day/30 Days Discharge Instructions: Wash your hands with soap and water. Remove old dressing, discard into plastic bag and place into trash. Cleanse the wound with Wound Cleanser prior to applying [name] clean dressing using gauze sponges, not tissues or cotton balls. Do not scrub or use excessive force. Pat dry using gauze sponges, not tissue or cotton balls. Topical: keystone gel 1 x Per Day/30 Days Prim Dressing: Aquacel Extra Hydrofiber Dressing, 2x2 (in/in) 1 x Per Day/30 Days ary Secondary Dressing: ABD Pad 5x9 (in/in) (Generic) 1 x Per Day/30 Days Discharge Instructions: Cover with ABD pad Secured With: Medipore T - 72M Medipore H Soft Cloth Surgical T ape ape, 2x2 (in/yd) 1 x Per Day/30 Days Secured With: [employer] Sterile or Non-Sterile 6-ply 4.5x4 (yd/yd) 1 x Per Day/30 Days Discharge Instructions: Apply Kerlix as directed WOUND #12: - Foot Wound Laterality: Plantar, Left, Medial Cleanser: Wound Cleanser 1 x Per Day/30 Days Discharge Instructions: Wash your hands with soap and water. Remove old dressing, discard into plastic bag and place into trash. Cleanse the wound with Wound Cleanser prior to applying [name] clean dressing using gauze sponges, not tissues or cotton balls. Do not scrub or use excessive force. Pat dry using gauze sponges, not tissue or cotton balls. Peri-Wound Care: Triamcinolone Acetonide Cream, 0.1%, 15 (g) tube 1 x Per Day/30 Days Discharge Instructions: Apply as directed to irritated areas. Secondary Dressing: ABD Pad 5x9 (in/in) 1 x  Per Day/30 Days Discharge Instructions: Cover with ABD pad Secondary Dressing: Foam Dressing, 4x4 (in/in) 1 x Per Day/30 Days Discharge Instructions: used for padding ankle and forefoot. 1. Apligraf placed in standard fashion 2. In office sharp debridement 3. T contact cast placed in standard fashionooleft lower extremity otal 4. Follow-up in 1 week 5. Surgical shoe with peg assist to the right foot Electronic Signature(s) Signed: 11/23/2022 1:38:11 PM By: Heather Shan DO Entered By: [name]  on 11/23/2022 12:02:16 MIKEILA, BURGEN [name] ([id number]) [id number] Page 20 of 21 -------------------------------------------------------------------------------- Total Contact Cast Details Patient Name: Date of Service: ALONDRA, VANDEVEN 11/23/2022 11:00 [name]: [id number] Patient Account Number: [id number] Date of Birth/Sex: Treating RN: 05/18/1976 (47 y.o. [name] Primary Care Provider: Raelene [name] Other Clinician: Massie [name] Referring Provider: Treating Provider/Extender: [name] in Treatment: 36 T Contact Cast Applied for Wound Assessment: otal Wound #12 Left,Medial,Plantar Foot Performed By: Physician Heather Shan, MD Post Procedure Diagnosis Same as Pre-procedure Electronic Signature(s) Signed: 11/23/2022 1:38:11 PM By: Heather Shan DO Signed: 11/23/2022 4:51:29 PM By: [name] Entered By: [name] on 11/23/2022 11:41:32 -------------------------------------------------------------------------------- SuperBill Details Patient Name: Date of Service: [name] 11/23/2022 Medical Record Number: [id number] Patient Account Number: [id number] Date of Birth/Sex: Treating RN: 1976-08-05 (47 y.o. [name], Heather Primary Care Provider: Raelene [name] Other Clinician: Massie [name] Referring Provider: Treating Provider/Extender: [name]  in Treatment: 36 Diagnosis Coding ICD-10 Codes Code Description [phone] Non-pressure chronic ulcer of other part of left foot with other specified severity 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.621 Type 2 diabetes mellitus with foot ulcer E11.42 Type 2 diabetes mellitus with diabetic polyneuropathy L97.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 of skin Facility Procedures : CPT4 Code: [id number] Description: [phone] (Facility Use Only) Apligraf 44 SQ CM Modifier: Quantity: 44 : Weissberg, K CPT4 Code: [id number] RISTI [name] ([id number] Description: 74944 - SKIN SUB GRAFT FACE/NK/HF/G ICD-10 Diagnosis Description 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 2) [id number] - DEB SUBQ TISSUE 20 SQ CM/< ICD-10 Diagnosis Description  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.621 Type 2 diabetes mellitus with foot ulcer Modifier: ity Physician_218 1 Quantity: 1 17.pdf Page 21 of 21 Physician Procedures : CPT4 Code Description Modifier [id number] 03009 - WC PHYS SKIN SUB GRAFT FACE/NK/HF/G ICD-10 Diagnosis Description 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ity Quantity: 1 : [id number] 11042 - WC PHYS SUBQ TISS 20 SQ CM ICD-10 Diagnosis Description 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.621 Type 2 diabetes mellitus with foot ulcer Quantity: 1 Electronic Signature(s) Signed: 11/23/2022 1:38:11 PM By: Heather Shan DO Entered By: [name] on 11/23/2022 12:03:04

## 2022-11-24 NOTE — Progress Notes (Signed)
[name], [name] ([id number]) [id number] Page 1 of 11 Visit Report for 11/23/2022 Arrival Information Details Patient Name: Date of Service: TEYANNA, [name] 11/23/2022 11:00 [name]: [id number] Patient Account Number: [id number] Date of Birth/Sex: Treating RN: 1975-10-25 (47 y.o. [name], [name] Primary Care Laury Huizar: Raelene Bott Other Clinician: Massie Kluver Referring Jane Broughton: Treating Niklaus Mamaril/Extender: Eddie North in Treatment: 15 Visit Information History Since Last Visit All ordered tests and consults were completed: No Patient Arrived: Ambulatory Added or deleted any medications: No Arrival Time: 11:15 Any new allergies or adverse reactions: No Transfer Assistance: None Had [name] fall or experienced change in No Patient Requires Transmission-Based Precautions: No activities of daily living that may affect Patient Has Alerts: No risk of falls: Signs or symptoms of abuse/neglect since last visito No Hospitalized since last visit: No Implantable device outside of the clinic excluding No cellular tissue based products placed in the center since last visit: Has Dressing in Place as Prescribed: Yes Has Footwear/Offloading in Place as Prescribed: Yes Left: T Contact Cast otal Pain Present Now: Yes Electronic Signature(s) Signed: 11/23/2022 4:51:29 PM By: Massie Kluver Entered By: Massie Kluver on 11/23/2022 11:19:33 -------------------------------------------------------------------------------- Clinic Level of Care Assessment Details Patient Name: Date of Service: [name], [name] 11/23/2022 11:00 [name]: [id number] Patient Account Number: [id number] Date of Birth/Sex: Treating RN: 1976-08-28 (47 y.o. [name] Primary Care Kem Hensen: Raelene Bott Other Clinician: Massie Kluver Referring Deloyce Walthers: Treating Kylle Lall/Extender: Eddie North in  Treatment: 36 Clinic Level of Care Assessment Items TOOL 1 Quantity Score []  - 0 Use when EandM and Procedure is performed on INITIAL visit ASSESSMENTS - Nursing Assessment / Reassessment []  - 0 General Physical Exam (combine w/ comprehensive assessment (listed just below) when performed on new pt. evals) [name], [name] ([id number]) [id number] Page 2 of 11 []  - 0 Comprehensive Assessment (HX, ROS, Risk Assessments, Wounds Hx, etc.) ASSESSMENTS - Wound and Skin Assessment / Reassessment []  - 0 Dermatologic / Skin Assessment (not related to wound area) ASSESSMENTS - Ostomy and/or Continence Assessment and Care []  - 0 Incontinence Assessment and Management []  - 0 Ostomy Care Assessment and Management (repouching, etc.) PROCESS - Coordination of Care []  - 0 Simple Patient / Family Education for ongoing care []  - 0 Complex (extensive) Patient / Family Education for ongoing care []  - 0 Staff obtains Programmer, systems, Records, T Results / Process Orders est []  - 0 Staff telephones HHA, Nursing Homes / Clarify orders / etc []  - 0 Routine Transfer to another Facility (non-emergent condition) []  - 0 Routine Hospital Admission (non-emergent condition) []  - 0 New Admissions / Biomedical engineer / Ordering NPWT Apligraf, etc. , []  - 0 Emergency Hospital Admission (emergent condition) PROCESS - Special Needs []  - 0 Pediatric / Minor Patient Management []  - 0 Isolation Patient Management []  - 0 Hearing / Language / Visual special needs []  - 0 Assessment of Community assistance (transportation, D/C planning, etc.) []  - 0 Additional assistance / Altered mentation []  - 0 Support Surface(s) Assessment (bed, cushion, seat, etc.) INTERVENTIONS - Miscellaneous []  - 0 External ear exam []  - 0 Patient Transfer (multiple staff / Civil Service fast streamer / Similar devices) []  - 0 Simple Staple / Suture removal (25 or less) []  - 0 Complex Staple / Suture removal (26 or  more) []  - 0 Hypo/Hyperglycemic Management (do not check if billed separately) []  - 0 Ankle / Brachial Index (ABI) - do not check if billed  separately Has the patient been seen at the hospital within the last three years: Yes Total Score: 0 Level Of Care: ____ Electronic Signature(s) Signed: 11/23/2022 4:51:29 PM By: Massie Kluver Entered By: Massie Kluver on 11/23/2022 11:46:04 -------------------------------------------------------------------------------- Encounter Discharge Information Details Patient Name: Date of Service: Heather Hitch [name]. 11/23/2022 11:00 [name]: [id number] Patient Account Number: [id number] Date of Birth/Sex: Treating RN: 1975/11/26 (47 y.o. Tanee, Henery, Asiana [name] ([id number]) [phone].pdf Page 3 of 11 Primary Care Kevron Patella: Raelene Bott Other Clinician: Massie Kluver Referring Shaunte Tuft: Treating Declan Mier/Extender: Eddie North in Treatment: 43 Encounter Discharge Information Items Post Procedure Vitals Discharge Condition: Stable Temperature (F): 98.5 Ambulatory Status: Ambulatory Pulse (bpm): 67 Discharge Destination: Home Respiratory Rate (breaths/min): 16 Transportation: Private Auto Blood Pressure (mmHg): 123/75 Accompanied By: mother Schedule Follow-up Appointment: Yes Clinical Summary of Care: Electronic Signature(s) Signed: 11/23/2022 4:51:29 PM By: Massie Kluver Entered By: Massie Kluver on 11/23/2022 12:26:04 -------------------------------------------------------------------------------- Lower Extremity Assessment Details Patient Name: Date of Service: [name], [name] 11/23/2022 11:00 [name]: [id number] Patient Account Number: [id number] Date of Birth/Sex: Treating RN: 22-Oct-1976 (47 y.o. [name] Primary Care Harald Quevedo: Raelene Bott Other Clinician: Massie Kluver Referring Nayra Coury: Treating Tarrie Mcmichen/Extender: Linna Hoff Weeks in Treatment: 36 Edema Assessment Assessed: [Left: No] [Right: No] [Left: Edema] [Right: :] Calf Left: Right: Point of Measurement: 30 cm From Medial Instep Ankle Left: Right: Point of Measurement: 9 cm From Medial Instep Electronic Signature(s) Signed: 11/23/2022 4:51:29 PM By: Massie Kluver Signed: 11/23/2022 7:35:08 PM By: Gretta Cool, BSN, RN, CWS, [name] RN, BSN Entered By: Massie Kluver on 11/23/2022 11:34:22 [name] ([id number]) [id number] Page 4 of 11 -------------------------------------------------------------------------------- Multi Wound Chart Details Patient Name: Date of Service: [name], [name] 11/23/2022 11:00 [name]: [id number] Patient Account Number: [id number] Date of Birth/Sex: Treating RN: Mar 04, 1976 (47 y.o. 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: Extremity Chronic sinus problems/congestion, Chronic sinus problems/congestion, N/[name] Comorbid History: Middle ear problems, Anemia, Chronic Middle ear problems, Anemia, Chronic Obstructive Pulmonary Disease Obstructive Pulmonary Disease (COPD), Congestive Heart Failure, (COPD), Congestive Heart Failure, Type II Diabetes, End Stage Renal Type II Diabetes, End Stage Renal Disease, History of pressure wounds, Disease, History of pressure wounds, Neuropathy Neuropathy 12/01/2021 03/16/2020 N/[name] Date  Acquired: 33 36 N/[name] Weeks of Treatment: Open Open N/[name] Wound Status: No No N/[name] Wound Recurrence: 0.7x0.6x0.7 1.5x0.7x0.1 N/[name] Measurements L x W x D (cm) 0.33 0.825 N/[name] [name] (cm) : rea 0.231 0.082 N/[name] Volume (cm) : 82.10% 82.50% N/[name] % Reduction in Area: 86.10% 91.30% N/[name] % Reduction in Volume: Full Thickness Without Exposed Grade 3 N/[name] Classification: Support Structures Medium Medium N/[name] Exudate Amount: Serosanguineous Serous N/[name] Exudate Type: red, brown amber N/[name] Exudate Color: Distinct, outline attached Flat and Intact N/[name] Wound Margin: Medium (34-66%) Small (1-33%) N/[name] Granulation Amount: Pale Red, Pink N/[name] Granulation Quality: Medium (34-66%) Large (67-100%) N/[name] Necrotic Amount: Fat Layer (Subcutaneous Tissue): Yes Fat Layer (Subcutaneous Tissue): Yes N/[name] Exposed Structures: Fascia: No Fascia: No Tendon: No Tendon: No Muscle: No Muscle: No Joint:  No Joint: No Bone: No Bone: No Medium (34-66%) None N/[name] Epithelialization: Treatment Notes Electronic Signature(s) Signed: 11/23/2022 4:51:29 PM By: Massie Kluver Entered By: Massie Kluver on 11/23/2022 11:34:27 [name] ([id number]) [id number] Page 5 of 11 -------------------------------------------------------------------------------- Multi-Disciplinary Care Plan Details Patient Name: Date of Service: [name], [name] 11/23/2022 11:00 [name]: [id number] Patient Account Number: [id number] Date of Birth/Sex: Treating RN: 1976-09-14 (47 y.o. [name], [name] Primary Care Shruthi Northrup: Raelene Bott Other Clinician: Massie Kluver Referring Platon Arocho: Treating Angie Hogg/Extender: Eddie North in Treatment: 40 Active Inactive Abuse / Safety / Falls / Self Care Management Nursing Diagnoses: History of Falls Potential for falls Potential for injury related to falls Goals: Patient will not develop complications from immobility Date  Initiated: 03/17/2022 Date Inactivated: 04/20/2022 Target Resolution Date: 03/16/2022 Goal Status: Met Patient/caregiver will verbalize understanding of skin care regimen Date Initiated: 03/17/2022 Date Inactivated: 09/28/2022 Target Resolution Date: 03/16/2022 Goal Status: Met Patient/caregiver will verbalize/demonstrate measure taken to improve self care Date Initiated: 03/17/2022 Target Resolution Date: 03/16/2022 Goal Status: Active Interventions: Assess fall risk on admission and as needed Provide education on basic hygiene Provide education on personal and home safety Notes: Necrotic Tissue Nursing Diagnoses: Impaired tissue integrity related to necrotic/devitalized tissue Knowledge deficit related to management of necrotic/devitalized tissue Goals: Necrotic/devitalized tissue will be minimized in the wound bed Date Initiated: 03/17/2022 Target Resolution Date: 10/26/2022 Goal Status: Active Patient/caregiver will verbalize understanding of reason and process for debridement of necrotic tissue Date Initiated: 03/17/2022 Date Inactivated: 09/28/2022 Target Resolution Date: 03/16/2022 Goal Status: Met Interventions: Provide education on necrotic tissue and debridement process Treatment Activities: Apply topical anesthetic as ordered : 03/16/2022 Excisional debridement : 03/16/2022 Notes: Osteomyelitis Nursing Diagnoses: Infection: osteomyelitis [name], [name] ([id number]) [id number] Page 6 of 11 Knowledge deficit related to disease process and management Goals: Patient/caregiver will verbalize understanding of disease process and disease management Date Initiated: 03/17/2022 Target Resolution Date: 03/16/2022 Goal Status: Active Patient's osteomyelitis will resolve Date Initiated: 03/17/2022 Target Resolution Date: 10/26/2022 Goal Status: Active Signs and symptoms for osteomyelitis will be recognized and promptly addressed Date Initiated:  03/17/2022 Target Resolution Date: 10/26/2022 Goal Status: Active Interventions: Assess for signs and symptoms of osteomyelitis resolution every visit Provide education on osteomyelitis Treatment Activities: Systemic antibiotics : 03/16/2022 Notes: Patient on antibiotics. Pressure Nursing Diagnoses: Knowledge deficit related to management of pressures ulcers Potential for impaired tissue integrity related to pressure, friction, moisture, and shear Goals: Patient will remain free from development of additional pressure ulcers Date Initiated: 11/02/2022 Target Resolution Date: 11/09/2022 Goal Status: Active Patient/caregiver will verbalize risk factors for pressure ulcer development Date Initiated: 11/02/2022 Target Resolution Date: 11/02/2022 Goal Status: Active Patient/caregiver will verbalize understanding of pressure ulcer management Date Initiated: 11/02/2022 Target Resolution Date: 11/02/2022 Goal Status: Active Interventions: Assess: immobility, friction, shearing, incontinence upon admission and as needed Provide education on pressure ulcers Notes: Electronic Signature(s) Signed: 11/23/2022 4:51:29 PM By: Massie Kluver Signed: 11/23/2022 7:35:08 PM By: Gretta Cool, BSN, RN, CWS, [name] RN, BSN Entered By: Massie Kluver on 11/23/2022 12:25:16 -------------------------------------------------------------------------------- Pain Assessment Details Patient Name: Date of Service: Heather Hitch [name]. 11/23/2022 11:00 [name]: [id number] Patient Account Number: [id number] Date of Birth/Sex: Treating RN: 06/16/1976 (47 y.o. [name] Primary Care Avarose Mervine: Raelene Bott Other Clinician: Massie Kluver Referring Miasia Crabtree: Treating Kenshin Splawn/Extender: Eddie North in Treatment: 36 Active Problems Location of Pain Severity and Description of Pain [name], [name] ([id number]) [id number] Page 7 of 11  Patient Has Paino  Yes Site Locations Pain Location: Pain in Ulcers Duration of the Pain. Constant / Intermittento Constant Rate the pain. Current Pain Level: 7 Character of Pain Describe the Pain: Burning Pain Management and Medication Current Pain Management: Medication: No Cold Application: No Rest: No Massage: No Activity: No T.E.N.S.: No Heat Application: No Leg drop or elevation: No Is the Current Pain Management Adequate: Inadequate How does your wound impact your activities of daily livingo Sleep: No Bathing: No Appetite: No Relationship With Others: No Bladder Continence: No Emotions: No Bowel Continence: No Work: No Toileting: No Drive: No Dressing: No Hobbies: No Electronic Signature(s) Signed: 11/23/2022 4:51:29 PM By: Massie Kluver Signed: 11/23/2022 7:35:08 PM By: Gretta Cool, BSN, RN, CWS, [name] RN, BSN Entered By: Massie Kluver on 11/23/2022 11:23:31 -------------------------------------------------------------------------------- Patient/Caregiver Education Details Patient Name: Date of Service: [name] 1/31/2024andnbsp11:00 [name]: [id number] Patient Account Number: [id number] Date of Birth/Gender: Treating RN: Sep 05, 1976 (47 y.o. [name] Primary Care Physician: Raelene Bott Other Clinician: Massie Kluver Referring Physician: Treating Physician/Extender: Eddie North in Treatment: 54 Education Assessment Education Provided To: Patient Education Topics Provided KEMBER, BOCH ([id number]) [id number] Page 8 of 11 Wound/Skin Impairment: Handouts: Other: continue wound care as directed Methods: Explain/Verbal Responses: State content correctly Electronic Signature(s) Signed: 11/23/2022 4:51:29 PM By: Massie Kluver Entered By: Massie Kluver on 11/23/2022 12:25:09 -------------------------------------------------------------------------------- Wound Assessment Details Patient  Name: Date of Service: Heather Hitch [name]. 11/23/2022 11:00 [name]: [id number] Patient Account Number: [id number] Date of Birth/Sex: Treating RN: 09/22/1976 (47 y.o. [name], [name] Primary Care Trenyce Loera: Raelene Bott Other Clinician: Massie Kluver Referring Jenette Rayson: Treating Andrianna Manalang/Extender: Linna Hoff Weeks in Treatment: 36 Wound Status Wound Number: 11 Primary Open Surgical Wound Etiology: Wound Location: Right, Plantar Foot Wound Open Wounding Event: Surgical Injury Status: Date Acquired: 12/01/2021 Comorbid Chronic sinus problems/congestion, Middle ear problems, Anemia, Weeks Of Treatment: 36 History: Chronic Obstructive Pulmonary Disease (COPD), Congestive Heart Clustered Wound: No Failure, Type II Diabetes, End Stage Renal Disease, History of pressure wounds, Neuropathy Photos Wound Measurements Length: (cm) 0.7 Width: (cm) 0.6 Depth: (cm) 0.7 Area: (cm) 0.33 Volume: (cm) 0.231 % Reduction in Area: 82.1% % Reduction in Volume: 86.1% Epithelialization: Medium (34-66%) Wound Description Classification: Full Thickness Without Exposed Suppo Wound Margin: Distinct, outline attached Exudate Amount: Medium Exudate Type: Serosanguineous Exudate Color: red, brown rt Structures Foul Odor After Cleansing: No Slough/Fibrino Yes Wound Bed Granulation Amount: Medium (34-66%) Exposed Structure Granulation Quality: Pale Fascia Exposed: No [name], [name] ([id number]) [id number] Page 9 of 11 Necrotic Amount: Medium (34-66%) Fat Layer (Subcutaneous Tissue) Exposed: Yes Tendon Exposed: No Muscle Exposed: No Joint Exposed: No Bone Exposed: No Treatment Notes Wound #11 (Foot) Wound Laterality: Plantar, Right Cleanser Wound Cleanser Discharge Instruction: Wash your hands with soap and water. Remove old dressing, discard into plastic bag and place into trash. Cleanse the wound with Wound Cleanser prior to  applying [name] clean dressing using gauze sponges, not tissues or cotton balls. Do not scrub or use excessive force. Pat dry using gauze sponges, not tissue or cotton balls. Peri-Wound Care Topical keystone gel Primary Dressing Aquacel Extra Hydrofiber Dressing, 2x2 (in/in) Secondary Dressing ABD Pad 5x9 (in/in) Discharge Instruction: Cover with ABD pad Secured With Medipore T - 50M Medipore H Soft Cloth Surgical T ape ape, 2x2 (in/yd) Kerlix Roll Sterile or Non-Sterile 6-ply 4.5x4 (yd/yd) Discharge Instruction: Apply Kerlix as directed Compression Wrap Compression Stockings Add-Ons Electronic Signature(s) Signed:  11/23/2022 4:51:29 PM By: Massie Kluver Signed: 11/23/2022 7:35:08 PM By: Gretta Cool BSN, RN, CWS, [name] RN, BSN Entered By: Massie Kluver on 11/23/2022 11:33:30 -------------------------------------------------------------------------------- Wound Assessment Details Patient Name: Date of Service: Heather Hitch [name]. 11/23/2022 11:00 [name]: [id number] Patient Account Number: [id number] Date of Birth/Sex: Treating RN: [birth year] (47 y.o. [name], [name] Primary Care Erandi Lemma: Raelene Bott Other Clinician: Massie Kluver Referring Treg Diemer: Treating Marzell [name]/Extender: Linna Hoff Weeks in Treatment: 36 Wound Status Wound Number: 12 Primary Diabetic Wound/Ulcer of the Lower Extremity Etiology: Wound Location: Left, Medial, Plantar Foot Wound Open Wounding Event: Pressure Injury Status: Date Acquired: 03/16/2020 Comorbid Chronic sinus problems/congestion, Middle ear problems, Anemia, Weeks Of Treatment: 36 History: Chronic Obstructive Pulmonary Disease (COPD), Congestive Heart Clustered Wound: No Failure, Type II Diabetes, End Stage Renal Disease, History of pressure wounds, Neuropathy Photos [name], Isatu [name] ([id number]) [id number] Page 10 of 11 Wound Measurements Length: (cm) 1.5 Width: (cm) 0.7 Depth: (cm)  0.1 Area: (cm) 0.825 Volume: (cm) 0.082 % Reduction in Area: 82.5% % Reduction in Volume: 91.3% Epithelialization: None Wound Description Classification: Grade 3 Wound Margin: Flat and Intact Exudate Amount: Medium Exudate Type: Serous Exudate Color: amber Foul Odor After Cleansing: No Slough/Fibrino No Wound Bed Granulation Amount: Small (1-33%) Exposed Structure Granulation Quality: Red, Pink Fascia Exposed: No Necrotic Amount: Large (67-100%) Fat Layer (Subcutaneous Tissue) Exposed: Yes Necrotic Quality: Adherent Slough Tendon Exposed: No Muscle Exposed: No Joint Exposed: No Bone Exposed: No Treatment Notes Wound #12 (Foot) Wound Laterality: Plantar, Left, Medial Cleanser Wound Cleanser Discharge Instruction: Wash your hands with soap and water. Remove old dressing, discard into plastic bag and place into trash. Cleanse the wound with Wound Cleanser prior to applying [name] clean dressing using gauze sponges, not tissues or cotton balls. Do not scrub or use excessive force. Pat dry using gauze sponges, not tissue or cotton balls. Peri-Wound Care Triamcinolone Acetonide Cream, 0.1%, 15 (g) tube Discharge Instruction: Apply as directed to irritated areas. Topical Primary Dressing Secondary Dressing ABD Pad 5x9 (in/in) Discharge Instruction: Cover with ABD pad Foam Dressing, 4x4 (in/in) Discharge Instruction: used for padding ankle and forefoot. Secured With Compression Wrap Compression Stockings Environmental education officer) Signed: 11/23/2022 4:51:29 PM By: Massie Kluver Signed: 11/23/2022 7:35:08 PM By: Gretta Cool, BSN, RN, CWS, [name] RN, BSN Entered By: Massie Kluver on 11/23/2022 11:34:05 [name] ([id number]) [id number] Page 11 of 11 -------------------------------------------------------------------------------- Vitals Details Patient Name: Date of Service: SHERIECE, JEFCOAT 11/23/2022 11:00 [name]:  [id number] Patient Account Number: [id number] Date of Birth/Sex: Treating RN: [birth year] (47 y.o. [name], [name] Primary Care Marlia Schewe: Raelene Bott Other Clinician: Massie Kluver Referring Chyrl Elwell: Treating Delories Mauri/Extender: Eddie North in Treatment: 36 Vital Signs Time Taken: 11:20 Temperature (F): 98.5 Height (in): 69 Pulse (bpm): 67 Weight (lbs): 178 Respiratory Rate (breaths/min): 16 Body Mass Index (BMI): 26.3 Blood Pressure (mmHg): 123/75 Reference Range: 80 - 120 mg / dl Electronic Signature(s) Signed: 11/23/2022 4:51:29 PM By: Massie Kluver Entered By: Massie Kluver on 11/23/2022 11:23:27

## 2022-11-28 DIAGNOSIS — I361 Nonrheumatic tricuspid (valve) insufficiency: Secondary | ICD-10-CM | POA: Diagnosis not present

## 2022-11-28 DIAGNOSIS — I340 Nonrheumatic mitral (valve) insufficiency: Secondary | ICD-10-CM | POA: Diagnosis not present

## 2022-11-30 ENCOUNTER — Encounter: Payer: Medicaid Other | Admitting: Internal Medicine

## 2022-12-01 ENCOUNTER — Encounter: Payer: Medicaid Other | Attending: Physician Assistant | Admitting: Physician Assistant

## 2022-12-01 DIAGNOSIS — L97512 Non-pressure chronic ulcer of other part of right foot with fat layer exposed: Secondary | ICD-10-CM | POA: Insufficient documentation

## 2022-12-01 DIAGNOSIS — L97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 of skin: Secondary | ICD-10-CM | POA: Diagnosis not present

## 2022-12-01 DIAGNOSIS — I132 Hypertensive heart and chronic kidney disease with heart failure and with stage 5 chronic kidney disease, or end stage renal disease: Secondary | ICD-10-CM | POA: Insufficient documentation

## 2022-12-01 DIAGNOSIS — N183 Chronic kidney disease, stage 3 unspecified: Secondary | ICD-10-CM | POA: Insufficient documentation

## 2022-12-01 DIAGNOSIS — L97528 Non-pressure chronic ulcer of other part of left foot with other specified severity: Secondary | ICD-10-CM | POA: Insufficient documentation

## 2022-12-01 DIAGNOSIS — F17210 Nicotine dependence, cigarettes, uncomplicated: Secondary | ICD-10-CM | POA: Insufficient documentation

## 2022-12-01 DIAGNOSIS — E11621 Type 2 diabetes mellitus with foot ulcer: Secondary | ICD-10-CM | POA: Diagnosis present

## 2022-12-01 DIAGNOSIS — E1142 Type 2 diabetes mellitus with diabetic polyneuropathy: Secondary | ICD-10-CM | POA: Diagnosis not present

## 2022-12-01 DIAGNOSIS — E1122 Type 2 diabetes mellitus with diabetic chronic kidney disease: Secondary | ICD-10-CM | POA: Insufficient documentation

## 2022-12-01 DIAGNOSIS — I5022 Chronic systolic (congestive) heart failure: Secondary | ICD-10-CM | POA: Diagnosis not present

## 2022-12-02 NOTE — Progress Notes (Signed)
Heather, LANGS [name] ([id number]) [id number] Page 1 of 20 Visit Report for 12/01/2022 Chief Complaint Document Details Patient Name: Date of Service: [name], [name] 12/01/2022 12:30 PM Medical Record Number: [id number] Patient Account Number: [id number] Date of Birth/Sex: Treating RN: December 18, 1975 (47 y.o. [name] Primary Care Provider: Raelene Bott Other Clinician: Referring Provider: Treating Provider/Extender: Mariana Single in Treatment: 37 Information Obtained from: Patient Chief Complaint 03/19/2021; patient referred by Dr. Luana Shu who has been looking after her left foot for quite [name] period of time for review of [name] nonhealing area in the left midfoot 03/12/2022; bilateral feet wounds and right lower extremity wound. Electronic Signature(s) Signed: 12/01/2022 1:50:30 PM By: Worthy Keeler PA-C Entered By: Worthy Keeler on 12/01/2022 13:50:30 -------------------------------------------------------------------------------- Debridement Details Patient Name: Date of Service: Heather Hitch [name]. 12/01/2022 12:30 PM Medical Record Number: [id number] Patient Account Number: [id number] Date of Birth/Sex: Treating RN: 12-21-1975 (47 y.o. [name] Primary Care Provider: Raelene Bott Other Clinician: Referring Provider: Treating Provider/Extender: Mariana Single in Treatment: 37 Debridement Performed for Assessment: Wound #11 Right,Plantar Foot Performed By: Physician Tommie Sams., PA-C Debridement Type: Debridement Level of Consciousness (Pre-procedure): Awake and Alert Pre-procedure Verification/Time Out Yes - 12:58 Taken: Pain Control: Lidocaine T Area Debrided (L x W): otal 1 (cm) x 1 (cm) = 1 (cm) Tissue and other material debrided: Viable, Non-Viable, Slough, Subcutaneous, Biofilm, Slough Level: Skin/Subcutaneous Tissue Debridement Description: Excisional Instrument: Curette Bleeding: Minimum Hemostasis  Achieved: Pressure Response to Treatment: Procedure was tolerated well Level of Consciousness (Post- Awake and Alert procedure): [name], RENTAS [name] ([id number] Page 2 of 20 Post Debridement Measurements of Total Wound Length: (cm) 1 Width: (cm) 1 Depth: (cm) 0.7 Volume: (cm) 0.55 Character of Wound/Ulcer Post Debridement: Stable Post Procedure Diagnosis Same as Pre-procedure Electronic Signature(s) Signed: 12/01/2022 6:29:11 PM By: Worthy Keeler PA-C Signed: 12/02/2022 1:49:05 PM By: Gretta Cool, BSN, RN, CWS, [name] RN, BSN Entered By: Gretta Cool, BSN, RN, CWS, [name] on 12/01/2022 12:59:50 -------------------------------------------------------------------------------- HPI Details Patient Name: Date of Service: Heather Hitch [name]. 12/01/2022 12:30 PM Medical Record Number: [id number] Patient Account Number: [id number] Date of Birth/Sex: Treating RN: Sep 26, 1976 (47 y.o. [name] Primary Care Provider: Raelene Bott Other Clinician: Referring Provider: Treating Provider/Extender: Mariana Single in Treatment: 37 History of Present Illness HPI Description: 01/18/18-She is here for initial evaluation of the left great toe ulcer. She is [name] poor historian in regards to timeframe in detail. She states approximately 4 weeks ago she lacerated her toe on something in the house. She followed up with her primary care who placed her on Bactrim and ultimately [name] second dose of Bactrim prior to coming to wound clinic. She states she has been treating the toe with peroxide, Betadine and [name] Band-Aid. She did not check her blood sugar this morning but checked it yesterday morning it was 327; she is unaware of [name] recent A1c and there are no current records. She saw Dr. she would've orthopedics last week for an old injury to the left ankle, she states he did not see her toe, nor did she bring it to his attention. She smokes approximately 1 pack cigarettes [name] day. Her  social situation is concerning, she arrives this morning with her mother who appears extremely intoxicated/under the influence; her mother was asked to leave the room and be monitored by the patient's grandmother. The patient's aunt then accompanied the patient and the room throughout the rest of  the appointment. We had [name] lengthy discussion regarding the deleterious effects of uncontrolled hyperglycemia and smoking as it relates to wound healing and overall health. She was strongly encouraged to decrease her smoking and get her diabetes under better control. She states she is currently on [name] diet and has cut down her [facility] consumption. The left toe is erythematous, macerated and slightly edematous with malodor present. The edema in her left foot is below her baseline, there is no erythema streaking. We will treat her with Santyl, doxycycline; we have ordered and xray, culture and provided [name] Peg assist surgical shoe and cultured the wound. 01/25/18-She is here in follow-up evaluation for [name] left great toe ulcer and presents with an abscess to her suprapubic area. She states her blood sugars remain elevated, feeling "sick" and if levels are below 250, but she is trying. She has made no attempt to decrease her smoking stating that we "can't take away her food in her cigarettes". She has been compliant with offloading using the PEG assist you. She is using Santyl daily. the culture obtained last week grew staph aureus and Enterococcus faecalis; continues on the doxycycline and Augmentin was added on Monday. The suprapubic area has erythema, no femoral variation, purple discoloration, minimal induration, was accessed with [name] cotton tip applicator with sanguinopurulent drainage, this was cultured, I suspect the current antibiotic treatment will cover and we will not add anything to her current treatment plan. She was advised to go to urgent care or ER with any change in redness, induration or  fever. 02/01/18-She is here in follow-up evaluation for left great toe ulcers and [name] new abdominal abscess from last week. She was able to use packing until earlier this week, where she "forgot it was there". She states she was feeling ill with GI symptoms last week and was not taking her antibiotic. She states her glucose levels have been predominantly less than 200, with occasional levels between 200-250. She thinks this was contributing to her GI symptoms as they have resolved without intervention. There continues to be significant laceration to left toe, otherwise it clinically looks stable/improved. There is now less superficial opening to the lateral aspect of the great toe that was residual blister. We will transition to [facility] to all wounds, she will continue her Augmentin. If there is no change or deterioration next week for reculture. 02/08/18-She is here in follow-up evaluation for left great toe ulcer and abdominal ulcer. There is an improvement in both wounds. She has been wrapping her left toe with coban, not by our direction, which has created an area of discoloration to the medial aspect; she has been advised to NOT use coban secondary to her neuropathy. She states her glucose levels have been high over this last week ranging from 200-350, she continues to smoke. She admits to being less compliant with her offloading shoe. We will continue with same treatment plan and she will follow-up next week. 02/15/18-She is here in follow-up evaluation for left great toe ulcer and abdominal ulcer. The abdominal ulcer is epithelialized. The left great toe ulcer is improved and all injury from last week using the Coban wrap is resolved, the lateral ulcer is healed. She admits to noncompliance with wearing offloading shoe and admits to glucose levels being greater than 300 most of the week. She continues to smoke and expresses no desire to quit. There is one area medially that probes deeper than  it has historically, erythema to the toe and dorsal foot  has consistently waxed and waned. There is no overt signs of cellulitis or infection but we will culture the wound for any occult infection given the new area of depth and erythema. We will hold off on sensitivities for initiation of antibiotic therapy. 02/22/18-She is here in follow up evaluation for left great toe ulcer. There is overall significant improvement in both wound appearance, erythema and edema with changes made last week. She was not initiated on antibiotic therapy. Culture obtained last week showed oxacillin sensitive staph aureus, sensitive to clindamycin. Clindamycin has been called into the pharmacy but she has been instructed to hold off on initiation secondary to overall clinical improvement and her history of antibiotic intolerance. She has been instructed to contact the clinic with any noted changes/deterioration and the wound, erythema, edema and/or pain. She will follow-up next week. She continues to smoke and her glucose levels remain elevated >250; she admits to compliance with offloading shoe Osgood, Annett [name] ([id number]) [phone].pdf Page 3 of 20 03/01/18 on evaluation today patient appears to be doing fairly well in regard to her left first toe ulcer. She has been tolerating the dressing changes with the [facility] Dressing without complication and overall this has definitely showed signs of improvement according to records as well is what the patient tells me today. I'm very pleased in that regard. She is having no pain today 03/08/18 She is here for follow up evaluation of [name] left great toe ulcer. She remains non-compliant with glucose control and smoking cessation; glucose levels consistently >200. She states that she got new shoe inserts/peg assist. She admits to compliance with offloading. Since my last evaluation there is significant improvement. We will switch to prisma at this time and  she will follow up next week. She is noted to be tachycardic at this appointment, heart rate 120s; she has [name] history of heart rate 70-130 according to our records. She admits to extreme agitation r/t personal issues; she was advised to monitor her heartrate and contact her physician if it does not return to [name] more normal range (<100). She takes cardizem twice daily. 03/15/18-She is here in follow-up evaluation for left great toe ulcer. She remains noncompliant with glucose control and smoking cessation. She admits to compliance with wearing offloading shoe. The ulcer is improved/stable and we will continue with the same treatment plan and she will follow-up next week 03/22/18-She is here for evaluation for left great toe ulcer. There continues to be significant improvement despite recurrent hyperglycemia (over 500 yesterday) and she continues to smoke. She has been compliant with offloading and we will continue with same treatment plan and she will follow-up next week. 03/29/18-She is here for evaluation for left great toe ulcer. Despite continuing to smoke and uncontrolled diabetes she continues to improve. She is compliant with offloading shoe. We will continue with the same treatment plan and she will follow-up next week 04/05/18- She is here in follow up evaluation for [name] left great toe ulcer; she presents with small pustule to left fifth toe (resembles ant bite). She admits to compliance with wearing offloading shoe; continues to smoke or have uncontrolled blood glucose control. There is more callus than usual with evidence of bleeding; she denies known trauma. 04/12/18-She is here for evaluation of left great toe ulcer. Despite noncompliance with glycemic control and smoking she continues to make improvement. She continues to wear offloading shoe. The pustule, that was identified last week, to the left fifth toe is resolved. She will follow-up in 2  weeks 05/03/18-she is seen in follow-up evaluation for [name]  left great toe ulcer. She is compliant with offloading, otherwise noncompliant with glycemic control and smoking. She has plateaued and there is minimal improvement noted. We will transition to [facility], replaced the insert to her surgical shoe and she will follow-up in one week 05/10/18- She is here in follow up evaluation for [name] left great toe ulcer. It appears stable despite measurement change. We will continue with same treatment plan and follow up next week. 05/24/18-She is seen in follow-up evaluation for [name] left great toe ulcer. She remains compliant with offloading, has made significant improvement in her diet, decreasing the amount of sugar/soda. She said her recent A1c was 10.9 which is lower than. She did see [name] diabetic nutritionist/educator yesterday. She continues to smoke. We will continue with the same treatment plan and she'll follow-up next week. 05/31/18- She is seen in follow-up evaluation for left great toe ulcer. She continues to remain compliant with offloading, continues to make improvement in her diet, increasing her water and decreasing the amount of sugar/soda. She does continue to smoke with no desire to quit. We will apply Prisma to the depth and Hydrofera Blue over. We have not received insurance authorization for oasis. She will follow up next week. 06/07/18-She is seen in follow-up evaluation for left great toe ulcer. It has stalled according to today's measurements although base appears stable. She says she saw [name] diabetic educator yesterday; her average blood sugars are less than 300 which is an improvement for her. She continues to smoke and states "that's my next step" She continues with water over soda. We will order for xray, culture and reinstate ace wrap compression prior to placing apligraf for next week. She is voicing no complaints or concerns. Her dressing will change to iodoflex over the next week in preparation for apligraf. 06/14/18-She is seen in follow-up  evaluation for left great toe ulcer. Plain film x-ray performed last week was negative for osteomyelitis. Wound culture obtained last week grew strep B and OSSA; she is initiated on keflex and cefdinir today; there is erythema to the toe which could be from ace wrap compression, she has [name] history of wrapping too tight and has has been encouraged to maintain ace wraps that we place today. We will hold off on application of apligraf today, will apply next week after antibiotic therapy has been initiated. She admits today that she has resumed taking [name] shower with her foot/toe submerged in water, she has been reminded to keep foot/toe out of the bath water. She will be seen in follow up next week 06/21/18-she is seen in follow-up evaluation for left great toe ulcer. She is tolerating antibiotic therapy with no GI disturbance. The wound is stable. Apligraf was applied today. She has been decreasing her smoking, only had 4 cigarettes yesterday and 1 today. She continues being more compliant in diabetic diet. She will follow-up next week for evaluation of site, if stable will remove at 2 weeks. 06/28/18- She is here in follow up evalution. Apligraf was placed last week, she states the dressing fell off on Tuesday and she was dressing with hydrofera blue. She is healed and will be discharged from the clinic today. She has been instructed to continue with smoking cessation, continue monitoring glucose levels, offloading for an additional 4 weeks and continue with hydrofera blue for additional two weeks for any possible microscopic opening. Readmission: 08/07/18 on evaluation today patient presents for reevaluation concerning the ulcer  of her right great toe. She was previously discharged on 06/28/18 healed. Nonetheless she states that this began to show signs of drainage she subsequently went to her primary care provider. Subsequently an x-ray was performed on 08/01/18 which was negative. The patient was also placed  on antibiotics at that time. Fortunately they should have been effective for the infection. Nonetheless she's been experiencing some improvement but still has [name] lot of drainage coming from the wound itself. 08/14/18 on evaluation today patient's wound actually does show signs of improvement in regard to the erythema at this point. She has completed the antibiotics. With that being said we did discuss the possibility of placing her in [name] total contact cast as of today although I think that I may want to give this just [name] little bit more time to ensure nothing recurrence as far as her infection is concerned. I do not want to put in the cast and risk infection at that time if things are not completely resolved. With that being said she is gonna require some debridement today. 08/21/18 on evaluation today patient actually appears to be doing okay in regard to her toe ulcer. She's been tolerating the dressing changes without complication. With that being said it does appear that she is ready and in fact I think it's appropriate for [place] to go ahead and initiate the total contact cast today. Nonetheless she will require some sharp debridement to prepare the wound for application. Overall I feel like things have been progressing well but we do need to do something to get this to close more readily. 08/24/18 patient seen today for reevaluation after having had the total contact cast applied on Tuesday. She seems to have done very well the wound appears to be doing great and overall I'm pleased with the progress that she's made. There were no abnormal areas of rubbing from the cast on her lower extremity. 08/30/18 on evaluation today patient actually appears to be completely healed in regard to her plantar toe ulcer. She tells me at this point she's been having [name] lot of issues with the cast. She almost fell [name] couple of times the state shall the step of her dog [name] couple times as well. This is been [name] very frustrating  process for her other nonetheless she has completely healed the wound which is excellent news. Overall there does not appear to be the evidence of infection at this time which is great news. 09/11/18 evaluation today patient presents for follow-up concerning her great toe ulcer on the left which has unfortunately reopened since I last saw her which was only [name] couple of weeks ago. Unfortunately she was not able to get in to get the shoe and potentially the AFO that's gonna be necessary due to her left foot drop. She continues with offloading shoe but this is not enough to prevent her from reopening it appears. When we last had her in the total contact cast she did well from [name] healing standpoint but unfortunately the wound reopened as soon as she came out of the cast within just [name] couple of weeks. 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 This is also part of the reason why she falls on [name] regular basis. I just do not believe that  is gonna be safe for [place] to reinitiate the total contact cast as last time we had this on she fell 3 times one week which is definitely not normal for her. 09/18/18 upon evaluation today the patient actually appears to be doing about the same in regard to her toe ulcer. She did not contact [employer] as I asked her to even though I had given her the prescription. In fact she actually states that she has no idea where the prescription is. She did apparently call [employer] and they told her that all she needed to do was bring the prescription in order to be able to be seen and work on getting the AFO for her left foot. With all that being said she still does not have an appointment and I'm not sure were things stand that regard. I will give her [name] new prescription today in order to contact  them to get this set up. 09/25/18 on evaluation today patient actually appears to be doing about the same in regard to her toes ulcer. She does have [name] small areas which seems to have [name] lot of callous buildup around the edge of the wound which is going to need sharp debridement today. She still is waiting to be scheduled for evaluation with [employer] for possibility of an AFO. She states there supposed to call her tomorrow to get this set up. Unfortunately it does appear that her foot specifically the toe area is showing signs of erythema. There does not appear to be any systemic infection which is in these good news. DESHANTA, FREUND [name] ([id number]) [id number] Page 4 of 20 10/02/18 on evaluation today patient actually appears to be doing about the same in regard to her toe ulcer. This really has not done too well although it's not significantly larger it's also not significantly smaller. She has been tolerating the dressing changes without complication. She actually has her appointment with [employer] and [facility] tomorrow to hopefully be measured for obtaining and AFO splint. I think this would be helpful preventing this from reoccurring. We had contemplated starting the cast this week although to be honest I am reluctant to do that as she's been having nausea, vomiting, and seizure activity over the past three days. She has [name] history of seizures and have been told is nothing that can be done for these. With that being said I do believe that along with the seizures have the nausea vomiting which upon further questioning doesn't seem to be the normal for her and makes me concerned for the possibility of infection or something else going on. I discussed this with the patient and her mother during the office visit today. I do not feel the wound is effective but maybe something else. The responses this was "this just happens to her at times and we don't know why". They did not seem to  be interested in going to the hospital to have this checked out further. 10/09/18 on evaluation today patient presents for follow-up concerning her ongoing toe ulcer. She has been tolerating the dressing changes without complication. Fortunately there does not appear to be any evidence of infection which is great news however I do think that the patient would benefit from going ahead for with the total contact cast. She's actually in [name] wheelchair today she tells me that she will use her walker if we initiate the cast. I was very specific about the fact that if we were gonna do the cast I wanted to make sure  that she was using the walker in order to prevent any falls. She tells me she does not have stairs that she has to traverse on [name] regular basis at her home. She has not had any seizures since last week again that something that happens to her often she tells me she did talk to [employer] and they said that it may take up to three weeks to get the brace approved for her. Hopefully that will not take that long but nonetheless in the meantime I do think the cast could be of benefit. 10/12/18 on evaluation today patient appears to be doing rather well in regard to her toe ulcer. It's just been [name] few days and already this is significantly improved both as far as overall appearance and size. Fortunately there's no sign of infection. She is here for her first obligatory cast change. 10/19/18 Seen today for follow up and management of left great toe ulcer. Wound continues to show improvement. Noted small open area with seroussang drainage with palpation. Denies any increased pain or recent fevers during visit. She will continue calcium alginate with offloading shoe. Denies any questions or concerns during visit. 10/26/18 on evaluation today patient appears to be doing about the same as when I last saw her in regard to her wound bed. Fortunately there does not appear to be any signs of infection. Unfortunately  she continues to have [name] breakdown in regard to the toe region any time that she is not in the cast. It takes almost no time at all for this to happen. Nonetheless she still has not heard anything from the brace being made by [employer] as to when exactly this will be available to her. Fortunately there is no signs of infection at this time. 10/30/18 on evaluation today patient presents for application of the total contact cast as we just received him this morning. Fortunately we are gonna be able to apply this to her today which is great news. She continues to have no significant pain which is good news. Overall I do feel like things have been improving while she was the cast is when she doesn't have [name] cast that things get worse. She still has not really heard anything from [facility] regarding her brace. 11/02/18 upon evaluation today patient's wound already appears to be doing significantly better which is good news. Fortunately there does not appear to be any signs of infection also good news. Overall I do think the total contact cast as before is helping to heal this area unfortunately it's just not gonna likely keep the area closed and healed without her getting her brace at least. Again the foot drop is [name] significant issue for her. 11/09/18 on evaluation today patient appears to be doing excellent in regard to her toe ulcer which in fact is completely healed. Fortunately we finally got the situation squared away with the paperwork which was needed to proceed with getting her brace approved by Medicaid. I have filled that out unfortunately that information has been sent to the orthopedic office that I worked at 2 1/2 years ago and not tired Current wound care measures. Fortunately she seems to be doing very well at this time. 11/23/18 on evaluation today patient appears to be doing More Poorly Compared to Last Time I Saw Her. At [facility] She Had Completely Healed. Currently she is continuing to have issues  with reopening. She states that she just found out that the brace was approved through [place] now she just  has to go get measured in order to have this fitted for her and then made. Subsequently she does not have an appointment for this yet that is going to complicate things we obviously cannot put her back in the cast if we do not have everything measured because they're not gonna be able to measure her foot while she is in the cast. 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 Obviously this is unfortunate and does have me somewhat worried as well. 11/30/18 on evaluation today patient's toe ulcer actually appears to be doing fairly well. The good news is she will be getting her brace in the shoes next week on Wednesday. Hopefully we will be able to get this to heal without having to go back in the cast however she may need the cast in order to get the wound completely heal and then go from there. Fortunately there's no signs of infection at this time. 12/07/18 on evaluation today patient fortunately did receive her brace and she states she could tell this definitely makes her walk better. With that being said she's been having issues with her toe where she noticed yesterday there was [name] lot of tissue that was loosing off this appears to be much larger than what it was previous. She also states that her leg has been read putting much across the top of her foot just about the ankle although this seems to be receiving somewhat. The total area is still red and appears to be someone infected as best I can tell. She is previously taken Bactrim and that may be [name] good option for her today as well. We are gonna see what I wound culture shows as well and I think that this is definitely appropriate. With that being said outside of the culture I still need to initiate something in the interim and that's what I'm gonna  go ahead and select Bactrim is [name] good option for her. 12/14/18 on evaluation today patient appears to be doing better in regard to her left great toe ulcer as compared to last week's evaluation. There's still some erythema although this is significantly improved which is excellent news. Overall I do believe that she is making good progress is still gonna take some time before she is where I would like her to be from the standpoint of being able to place her back into the total contact cast. Hopefully we will be where we need to be by next week. 12/21/18 on evaluation today patient actually appears to be doing poorly in regard to her toe ulcer. She's been tolerating the dressing changes without complication. Fortunately there's no signs of systemic infection although she does have [name] lot of drainage from the toe ulcer and this does seem to be causing some issues at this point. She does have erythema on the distal portion of her toe that appears to be likely cellulitis. 12/28/18 on evaluation today patient actually appears to be doing [name] little better in my pinion in regard to her toe ulcer. With that being said she still does have some evidence of infection at this time and for her culture she had both E. coli as well as enterococcus as organisms noted on evaluation. For that reason I think that though the Keflex likely has treated the E. coli rather well this has really done nothing for the enterococcus. We are going to  have to initiate treatment for this specifically. 01/04/19 on evaluation today patient's toe actually appears to be doing better from the standpoint of infection. She currently would like to see about putting the cash back on I think that this is appropriate as long as she takes care of it and keeps it from getting wet. She is gonna have some drainage we can definitely pass this up with Drawtex and alginate to try to prevent as much drainage as possible from causing the problems. With that  being said I do want to at least try her with the cast between now and Tuesday. If there any issues we can't continue to use it then I will discontinue the use of the cast at that point. 01/08/19 on evaluation today patient actually appears to be doing very well as far as her foot ulcer specifically the great toe on the left is concerned. She did have an area of rubbing on the medial aspect of her left ankle which again is from the cast. Fortunately there's no signs of infection at this point in this appears to be [name] very slight skin breakdown. The patient tells me she felt it rubbing but didn't think it was that bad. Fortunately there is no signs of active infection at this time which is good news. No fevers, chills, nausea, or vomiting noted at this time. 01/15/19 on evaluation today patient actually appears to be doing well in regard to her toe ulcer. Again as previous she seems to do well and she has the cast on which indicates to me that during the time she doesn't have [name] cast on she's putting way too much pressure on this region. Obviously I think that's gonna be an issue as with the current national emergency concerning the Covid-19 Virus it has been recommended that we discontinue the use of total contact casting by the chief medical officer of our company, Dr. Simona Huh. The reasoning is that if [name] patient becomes sick and cannot come into have the cast removed they could not just leave this on for an additional two weeks. Obviously the hospitals also do not want to receive patient's who are sick into the emergency department to potentially contaminate the region and spread the Covid-19 Virus among other sick individuals within the hospital system. Therefore at this point we are suspending the use of total contact cast until the current emergency subsides. This was all discussed with the patient today as well. BELA, DEMARZO [name] ([id number]) [id number] Page 5 of 20 01/22/19  on evaluation today patient's wound on her left great toe appears to be doing slightly worse than previously noted last week. She tells me that she has been on this quite [name] bit in fact she tells me she's been awake for 38 straight hours. This is due to the fact that she's having to care for grandparents because nobody else will. She has been taking care of them for five the last seven days since I've seen her they both have dementia his is from [name] stroke and her grandmother's was progressive. Nonetheless she states even her mom who knows her condition and situation has only help two of those days to take care of them she's been taking care of the rest. Fortunately there does not appear to be any signs of active infection in regard to her toe at this point although obviously it doesn't look as good as it did previous. I think this is directly related to her not taking off the pressure  and friction by way of taking things easy. Though I completely understand what's going on. 01/29/19 on evaluation today patient's tools are actually appears to be showing some signs of improvement today compared to last week's evaluation as far as not necessarily the overall size of the wound but the fact that she has some new skin growth in between the two ends of the wound opening. Overall I feel like she has done well she states that she had [name] family member give her what sounds to be [name] CAM walker boot which has been helpful as well. 02/05/19 on evaluation today patient's wound bed actually appears to be doing significantly better in regard to her overall appearance of the size of the wound. With that being said she is still having an issue with offloading efficiently enough to get this to close. Apparently there is some signs of infection at this point as well unfortunately. Previously she's done well of Augmentin I really do not see anything that needs to be culture currently but there theme and cellulitis of the foot that  I'm seeing I'm gonna go ahead and place her on an antibiotic today to try to help clear this up. 02/12/2019 on evaluation today patient actually appears to be doing poorly in regard to her overall wound status. She tells me she has been using her offloading shoe but actually comes in today wearing her tennis shoe with the AFO brace. Again as I previously discussed with her this is really not sufficient to allow the area to heal appropriately. Nonetheless she continues to be somewhat noncompliant and I do wonder based on what she has told my nurse in the past as to whether or not [name] good portion of this noncompliance may be recreational drug and alcohol related. She has had [name] history of heroin overdose and this was fairly recently in the past couple of months that have been seeing her. Nonetheless overall I feel like her wound looks significantly worse today compared to what it was previous. She still has significant erythema despite the Augmentin I am not sure that this is an appropriate medication for her infection I am also concerned that the infection may have gone down into her bone. 02/19/19 on evaluation today patient actually appears to be doing about the same in regard to her toe ulcer. Unfortunately she continues to show signs of bone exposure and infection at this point. There does not appear to be any evidence of worsening of the infection but I'm also not really sure that it's getting significantly better. She is on the Augmentin which should be sufficient for the Staphylococcus aureus infection that she has at this point. With that being said she may need IV antibiotics to more appropriately treat this. We did have [name] discussion today about hyperbaric option therapy. 02/28/19 on evaluation today patient actually appears to be doing much worse in regard to the wound on her left great toe as compared to even my previous evaluation last week. Unfortunately this seems to be training in [name] pretty poor  direction. Her toe was actually now starting to angle laterally and I can actually see the entire joint area of the proximal portion of the digit where is the distal portion of the digit again is no longer even in contact with the joint line. Unfortunately there's [name] lot more necrotic tissue around the edge and the toe appears to be showing signs of becoming gangrenous in my pinion. I'm very concerned about were things stand  at this point. She did see infectious disease and they are planning to send in [name] prescription for Sivextro for her and apparently this has been approved. With that being said I don't think she should avoid taking this but at the same time I'm not sure that it's gonna be sufficient to save her toe at this point. She tells me that she still having to care for grandparents which I think is putting quite [name] bit of strain on her foot and specifically the total area and has caused this to break down even to [name] greater degree than would've otherwise been expected. 03/05/19 on evaluation today patient actually appears to be doing quite well in regard to her toe all things considering. She still has bone exposed but there appears to be much less your thing on overall the appearance of the wound and the toe itself is dramatically improved. She still does have some issues currently obviously with infection she did see vascular as well and there concerned that her blood flow to the toad. For that reason they are setting up for an angiogram next week. 03/14/19 on evaluation today patient appears to be doing very poor in regard to her toe and specifically in regard to the ulceration and the fact that she's starting to notice the toe was leaning even more towards the lateral aspect and the complete joint is visible on the proximal aspect of the joint. Nonetheless she's also noted [name] significant odor and the tip of the toe is turning more dark and necrotic appearing. Overall I think she is getting worse  not better as far as this is concerned. For that reason I am recommending at this point that she likely needs to be seen for likely amputation. READMISSION 03/19/2021 This is [name] patient that we cared for in this clinic for [name] prolonged period of time in 2019 and 2020 with [name] left foot and left first toe wound. 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 Luana Shu. In December 2021 she had an ulcer on her right great toe as well as the fourth and fifth toes. 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 She also had an angiogram at that time and underwent angioplasty of the right anterior tibial artery. In any case she claims that the wound on the right foot is closed I did not look at this today which was probably an oversight although I think that should be done next week. 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 According to Dr. Deborra Medina last review that she was out of the area being cared for by another physician but recently came back to his attention. 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 [name] culture of this area showed E. coli apparently before she came back to see Dr. Luana Shu she was supposed to be receiving antibiotics but she did not really take them. Nor is she offloading this area at all. Finally her last hemoglobin A1c listed in epic was in March 2022 at 14.1 she says things are [name] lot better since then although I am not sure. 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 She had this wound at the time. During this admission she had E. coli sepsis [name] TEE was  negative. 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 She is still [name] heavy cigarette smoker. She has [name] history of bipolar disorder seizure disorder ABI in our clinic was 1.05 6/1; left midfoot in the setting of [name] TMA done previously. Round circular wound with [name] "knuckle" of protruding tissue. The problem is that the knuckle was not attached to any of the surrounding granulation and this probed proximally widely I removed [name] large portion of this tissue. This wound goes with considerable undermining laterally. I do not feel any bone there was no purulence but this is [name] deep wound. 6/8; in spite of the debridement I did last week. She arrives with [name] wound looking exactly the same. [name] protruding "knuckle" of tissue nonadherent to most of the surrounding tissue. There is considerable depth around this from 6-12 o'clock at 2.7 cm and undermining of 1 cm. This does not look overtly infected and the x- ray I did last week was negative for any osseous abnormalities. We have been using silver collagen 6/15; deep tissue culture I did last week showed moderate staph aureus and moderate Pseudomonas. This will definitely require prolonged antibiotic therapy. The pathology on the protuberant area was negative for malignancy fungus etc. 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 We have been using silver collagen. I am going to be prescribing Levaquin for 2 weeks. Her CT scan of the foot is down for 7/5 6/22; CT scan of the foot on 7 5. She says she has hardware in the left leg from her previous fracture. She is on the Levaquin for the deep tissue culture I did that showed methicillin sensitive staph aureus and Pseudomonas. I gave her [name] 2-week supply and she will have another week. She arrives in clinic today with the same protuberant tissue however this  is nonadherent to the tissue surrounding it. I am really at [name] loss to explain this unless there is underlying deep tissue infection 6/29; patient presents for 1 week follow-up. She has been using collagen to the wound bed. She reports taking her antibiotics as prescribed.She has no complaints or issues today. She denies signs of infection. 7/6; patient presents for one week followup. She has been using collagen to the wound bed. She states she is taking Levaquin however at times she is not able to keep it down. She denies signs of infection. 7/13; patient presents for 1 week follow-up. She has been using silver alginate to the wound bed. She still has nausea when taking her antibiotics. She denies signs of infection. EOWYN, HARBERTS [name] ([id number]) [id number] Page 6 of 20 7/20; patient presents for 1 week follow-up. She has been using silver alginate with gentamicin cream to the wound bed. She denies any issues and has no complaints today. She denies signs of infection. 7/27; patient presents for 1 week follow-up. She continues to use silver alginate with gentamicin cream to the wound bed. She reports starting her antibiotics. She has no issues or complaints. Overall she reports stability to the wound. 8/3; patient presents for 1 week follow-up. She has been using silver alginate with gentamicin cream to the wound bed. She reports completing all antibiotics. She has no issues or complaints today. She denies signs of infection. 8/17; patient presents  for 2-week follow-up. He is to use silver alginate to the wound bed. She has no issues or complaints today. She denies signs of infection. She reports her pain has improved in her foot since last clinic visit 8/24; patient presents for 1 week follow-up. She continues to use silver alginate to the wound bed. She has no issues or complaints. She denies signs of infection. Pain is stable. 9/7; patient presents for follow-up. She  missed her last week appointment due to feeling ill. She continues to use silver alginate. She has [name] new wound to the right lower extremity that is covered in eschar. She states It occurred over the past week and has no idea how it started. She currently denies signs of infection. 9/14; patient presents for follow-up. T the left foot wound she has been using gentamicin cream and silver alginate. T the right lower extremity wound she has o o been keeping this covered and has not obtain Santyl. 9/21; patient presents for follow-up. She reports using gentamicin cream and silver alginate to the left foot and Santyl to the right lower extremity wound. She has no issues or complaints today. She denies signs of infection. 9/28; patient presents for follow-up. She reports [name] new wound to her right heel. She states this occurred [name] few days ago and is progressively gotten worse. She has been trying to clean the area with [name] Q-tip and Santyl. She reports stability in the other 2 wounds. She has been using gentamicin cream and silver alginate to the left foot and Santyl to the right lower extremity wound. 10/12; patient presents for follow-up. She reports improvement to the wound beds. She is seeing vein and vascular to discuss the potential of [name] left BKA. She states they are going to do an arteriogram. She continues to use silver alginate with dressing changes to her wounds. 11/2; patient presents for follow-up. She states she has not been doing dressing changes to the wound beds. She states she is not able to offload the areas. She reports chronic pain to her left foot wound. 11/9; patient presents for follow-up. She came in with only socks on. She states she forgot to put on shoes. It is unclear if she is doing any dressing changes. She currently denies systemic signs of infection. 11/16; patient presents for follow-up. She came again only with socks on. She states she does not wear shoes ever. It is unclear  if she does dressing changes. She currently denies systemic signs of infection. 11/23; patient presents for follow-up. She wore her shoes today. It still unclear exactly what dressing she is using for each wound but she did states she obtained Dakin's solution and has been using this to the left foot wound. She currently denies signs of infection. 11/30; patient presents for follow-up. She has no issues or complaints today. She currently denies signs of infection. 12/7; patient presents for follow-up. She has no issues or complaints today. She has been using Hydrofera Blue to the right heel wound and Dakin solution to the left foot wound. Her right anterior leg wound is healed. She currently denies signs of infection. 12/14; patient presents for follow-up. She has been using Hydrofera Blue to the right heel and Dakin's to the left foot wounds. She has no issues or complaints today. She denies signs of infection. 12/21; patient presents for follow-up. She reports using Hydrofera Blue to the right heel and Dakin's to the left foot wound. She denies signs of infection. 12/28; patient presents for  follow-up. She continues to use Dakin's to the left foot wound and Hydrofera Blue to the right heel wound. She denies signs of infection. 1/4; patient presents for follow-up. She has no issues or complaints today. She denies signs of infection. 1/11; patient presents for follow-up. It is unclear if she has been dressing these wounds over the past week. She currently denies signs of infection. 1/18; patient presents for follow-up. She states she has been using Dakin's wet-to-dry dressings to the left foot. She has been using Hydrofera Blue to the right foot foot wound. She states that the anterior right leg wound has reopened and draining serous fluid. She denies signs of infection. 1/25; patient presents for follow-up. She has no issues or complaints today. 2/1; patient presents for follow-up. She has no  issues or complaints today. She denies signs of infection. 2/8; patient presents for follow-up. She has lost her surgical shoes. She did not have [name] dressing to the right heel wound. She currently denies signs of infection. 2/15; patient presents for follow-up. She reports more pain to the right heel today. She denies purulent drainage Or fever/chills 2/22; patient presents for follow-up. She reports taking clindamycin over the past week. She states that she continues to have pain to her right heel. She reports purulent drainage. Readmission 03/16/2022 Ms. Lita Campana is [name] 47 year old female with [name] past medical history of type 2 diabetes, osteomyelitis to her feet, chronic systolic heart failure and bipolar disorder that presents to the clinic for bilateral feet wounds and right lower extremity wound. She was last seen in our clinic on 12/15/2021. At that time she had purulent drainage coming out of her right plantar foot and I recommended she go to the ED. She states she went to [facility] and has been there for the past 3 months. I cannot see the records. She states she had OR debridement and was on several weeks of IV antibiotics while inpatient. Since discharge she has not been taking care of the wound beds. She had nothing on her feet other than socks today. She currently denies signs of infection. 5/31; patient presents for follow-up. She has been using Dakin's wet-to-dry dressings to the wound beds on her feet bilaterally and antibiotic ointment to the right anterior leg wound. She had [name] wound culture done at last clinic visit that showed moderate Pseudomonas aeruginosa sensitive to ciprofloxacin. She currently denies systemic signs of infection. 6/14; patient presents for follow-up. She received Keystone 5 days ago and has been using this on the wound beds. She states that last week she had to go to the hospital because she had increased warmth and erythema to the right foot. She  was started on 2 oral antibiotics. She states she has been taking these. She currently denies systemic signs of infection. She has no issues or complaints today. 6/21; patient presents for follow-up. She states she has been using Keystone antibiotics to the wound beds. She has no issues or complaints today. She denies signs of infection. 6/28; patient presents for follow-up. She has been using Keystone antibiotics to the wound beds. She has no issues or complaints today. 7/12; patient presents for follow-up. Has been using Keystone antibiotics to the wound beds with calcium alginate. She has no issues or complaints today. She never followed up with her orthopedic surgeon who did the OR debridement to the right foot. We discussed the total contact cast for the left foot and patient would like to do this next week. Platner,  Malayla [name] ([id number] Page 7 of 20 7/19; patient presents for follow-up. She has been using Keystone antibiotics with calcium alginate to the wound beds. She has no issues or complaints today. Patient is in agreement to do the total contact cast of the left foot today. She knows to return later this week for the obligatory cast change. 05-13-2022 upon evaluation today patient's wound which she has the cast of the left leg actually appears to be doing significantly better. Fortunately I do not see any signs of active infection locally or systemically which is great news and overall I am extremely pleased with where we stand currently. 7/26; patient presents for follow-up. She has [name] cast in place for the past week. She states it irritated her shin. Other than that she tolerated the cast well. She states she would like [name] break for 1 week from the cast. We have been using Keystone antibiotic and Aquacel to both wound beds. She denies signs of infection. 8/2; patient presents for follow-up. She has been using Keystone and Aquacel to the wound beds.  She denies any issues and has no complaints. She is agreeable to have the cast placed today for the left leg. 06-03-2022 upon evaluation today patient appears to be doing well with regard to her wound she saw some signs of improvement which is great news. Fortunately I do not see any evidence of active infection locally or systemically at this time which is great news. No fevers, chills, nausea, vomiting, or diarrhea. 8/16; patient presents for follow-up. She has no issues or complaints today. We have been using Keystone and Aquacel to the wound beds. The left lower extremity is in [name] total contact cast. She is tolerated this well. 8/23; patient presents for follow-up. She has had the total contact cast on the left leg for the past week. Unfortunately this has rubbed and broken down the skin to the medial foot. She currently denies signs of infection. She has been using Keystone antibiotic to the right plantar foot wound. 8/30; patient presents for follow-up. We have held off on the total contact cast for the left leg for the past week. Her wound on the left foot has improved and the previous surrounding breakdown of skin has epithelialized. She has been using Keystone antibiotic to both wound beds. She has no issues or complaints today. She denies signs of infection. 9/6; patient presents for follow-up. She has ordered her's Keystone antibiotic and this is arriving this week. She has been doing Dakin's wet-to-dry dressings to the wound beds. She denies signs of infection. She is agreeable to the total contact cast today. 9/13; patient presents for follow-up. She states that the cast caused her left leg shin to be sore. She would like to take [name] break from the cast this week. She has been using Keystone antibiotic to the right plantar foot wound. She denies signs of infection. 9/20; patient presents for follow-up. She has been using Keystone antibiotics to the wound beds with calcium alginate to the  right foot wound and Hydrofera Blue to the left foot wound. She is agreeable to having the cast placed today. She has been approved for Apligraf and we will order this for next clinic visit. 9/27; patient presents for follow-up. We have been using Keystone antibiotic with Hydrofera Blue to the left foot wound under [name] total contact cast. T the right o foot wound she has been using Keystone antibiotic and calcium alginate. She declines [name] total contact cast today.  Apligraf is available for placement and she would like to proceed with this. 07-28-2022 upon evaluation today patient appears to be doing well currently in regard to her wound. She is actually showing signs of significant improvement which is great news. Fortunately I do not see any evidence of active infection locally nor systemically at this time. She has been seeing Dr. Heber [place] and to be honest has been doing very well with the cast. Subsequently she comes in today with [name] cast on and we did reapply that today as well. She did not really want to she try to talk me out of that but I explained that if she wanted to heal this is really the right way to go. Patient voiced understanding. In regard to her right foot this is actually [name] lot better compared to the last time I saw her which is also great news. 10/11; patient presents for follow-up. Apligraf and the total contact cast was placed to the left leg at last clinic visit. She states that her right foot wound had burning pain to it with the placement of Apligraf to this area. She has been doing Luther over this area. She denies signs of infection including increased warmth, erythema or purulent drainage. 11/1; 3-week follow-up. The patient fortunately did not have [name] total contact cast or an Apligraf and on the left foot. She has been using Keystone ABD pads and kerlix and her own running shoes She arrives in clinic today with thick callus and [name] very poor surface on the left foot on the right  nonviable skin subcutaneous tissue and [name] deep probing hole. 11/15; patient missed her last clinic appointment. She states she has not been dressing the wound beds for the past 2 weeks. She states that at she had [name] new roommate but is now going back to live with her mother. Apparently its been [name] distracting 2 weeks. Patient currently denies signs of infection. 11/22; patient presents for follow-up. She states she has been using Keystone antibiotic and Dakin's wet-to-dry dressings to the wound beds. She is agreeable for cast placement today. We had ordered Apligraf however this has not been received by our facility. 11/29; Patient had [name] total contact cast placed at last clinic visit and she tolerated this well. We were using silver alginate under the cast. Patient's been using Keystone antibiotic with Aquacel to the right plantar foot wound. She has no issues or complaints today. Apligraf is available for placement today. Patient would like to proceed with this. 12/6; patient presents for follow-up. She had Apligraf placed in standard fashion last clinic visit under the total contact cast to the left lower extremity. She has been using Keystone antibiotic and Aquacel to the right plantar foot wound. She has no issues or complaints today. 12/13; patient presents for follow-up. She has finished 5 Apligraf placements. Was told she would not qualify for more. We have been doing [name] total contact cast to the left lower extremity. She has been using Keystone antibiotic and Aquacel to the right plantar foot wound. She has no issues or complaints today. 12/20; patient presents for follow-up. We have been using Hydrofera Blue with Keystone antibiotic under [name] total contact cast of the left lower extremity. She reports using Keystone antibiotic and silver alginate to the right heel wound. She has no issues or complaints today. 12/27; patient presents with [name] healthy wound on the left midfoot. We have Apligraf to apply  that to that more also using [name] total contact cast.  On the right we are using Keystone and silver alginate. She is offloading the right heel with [name] surgical shoealthough by her admission she is on her feet quite [name] bit 1/3; patient presents for follow-up. Apligraf was placed to the wound beds last clinic visit. She was placed in [name] total contact cast to the left lower extremity. She declines [name] total contact cast today. She states that her mother is in the hospital and she cannot adequately get around with the cast on. 1/10; patient presents for follow-up. She declined the total contact cast at last clinic visit. Both wounds have declined in appearance. She states that she has been on her feet and not offloading the wound beds. She currently denies signs of infection. 1/17; patient presents for follow-up. She had the total contact cast along with Apligraf placed last week to the left lower extremity. She tolerated this well. She has been using Aquacel Ag and Keystone antibiotic to the right heel wound. She currently denies signs of infection. She has no issues or complaints today. 1/24; patient presents for follow-up. We have been using Apligraf to the left foot wound along with [name] total contact cast. She has done well with this. T the right o heel wound she has been using Aquacel Ag and Keystone antibiotic ointment. She has no issues or complaints today. She denies signs of infection. 1/31; patient presents for follow-up. We have been using Apligraf to the left foot wound along with the total contact cast. She continues to do well with this. To the right heel we have been using Aquacel Ag and Keystone antibiotic ointment. She has no issues or complaints today. RIMSHA, KLINK [name] ([id number]) [id number] Page 8 of 20 12-01-2022 upon evaluation patient is seen today on my schedule due to the fact that she unfortunately was in the hospital yesterday. Her cast needed to come off the  only reason she is out of the hospital is due to the fact that they would not take it off at the hospital which is somewhat bewildered reading to me to be perfectly honest. I am not certain why this was but either way she was released and then was placed on my schedule today in order to get this off and reapply the total contact casting as appropriate. I do not have an Apligraf for her today it was applied last week and today's actually expired yesterday as there was some scheduling conflicts with her being in the hospital. Nonetheless we do not have that for reapplication today but the good news that she is not draining too much and the Apligraf can go for up to 2 weeks so I am going to go ahead and reapply the total contact casting but we are going to leave the Apligraf in place. Electronic Signature(s) Signed: 12/01/2022 6:04:22 PM By: Worthy Keeler PA-C Entered By: Worthy Keeler on 12/01/2022 18:04:22 -------------------------------------------------------------------------------- Physical Exam Details Patient Name: Date of Service: DONNETTE, SILVERIO 12/01/2022 12:30 PM Medical Record Number: [id number] Patient Account Number: [id number] Date of Birth/Sex: Treating RN: 1976/01/29 (47 y.o. [name] Primary Care Provider: Raelene Bott Other Clinician: Referring Provider: Treating Provider/Extender: Lockie Pares Weeks in Treatment: 50 Constitutional Well-nourished and well-hydrated in no acute distress. Respiratory normal breathing without difficulty. Psychiatric this patient is able to make decisions and demonstrates good insight into disease process. Alert and Oriented x 3. pleasant and cooperative. Notes Upon inspection patient's wound bed actually showed signs of doing quite well in regard  to the right foot the left foot the Apligraf was still in place so this was not actually visualized today. I did reapply the total contact cast however to the left foot on the  right foot we did perform debridement to clearway some of the necrotic debris and slough down to good subcutaneous tissue patient tolerated this without complication. Electronic Signature(s) Signed: 12/01/2022 6:04:45 PM By: Worthy Keeler PA-C Entered By: Worthy Keeler on 12/01/2022 18:04:44 -------------------------------------------------------------------------------- Physician Orders Details Patient Name: Date of Service: Heather Hitch [name]. 12/01/2022 12:30 PM Medical Record Number: [id number] Patient Account Number: [id number] Date of Birth/Sex: Treating RN: [birth year] (47 y.o. [name] Primary Care Provider: Raelene Bott Other Clinician: Referring Provider: Treating Provider/Extender: Mariana Single in Treatment: [phone] Verbal / Phone Orders: No KODIE, PETTREY [name] ([id number]) [id number] Page 9 of 20 Diagnosis Coding Follow-up Appointments Return Appointment in 1 week. Nurse Visit as needed Bathing/ Shower/ Hygiene May shower with wound dressing protected with water repellent cover or cast protector. No tub bath. Anesthetic (Use 'Patient Medications' Section for Anesthetic Order Entry) Lidocaine applied to wound bed Cellular or Tissue Based Products Cellular or Tissue Based Product Type: - Apligraf applied remained in place from last application Cellular or Tissue Based Product applied to wound bed; including contact layer, fixation with steri-strips, dry gauze and cover dressing. (DO NOT REMOVE). Edema Control - Lymphedema / Segmental Compressive Device / Other Elevate, Exercise Daily and [name] void Standing for Long Periods of Time. Elevate legs to the level of the heart and pump ankles as often as possible Elevate leg(s) parallel to the floor when sitting. Off-Loading Total Contact Cast to Left Lower Extremity Open toe surgical shoe - Heel off loader - Right foot Other: - keep pressure off of feet. Additional Orders /  Instructions Follow Nutritious Diet and Increase Protein Intake Other: - Stay off of foot, wheelchair only Medications-Please add to medication list. Wound #11 Right,Plantar Foot Keystone Compound Wound Treatment Wound #11 - Foot Wound Laterality: Plantar, Right Cleanser: Wound Cleanser 1 x Per Day/30 Days Discharge Instructions: Wash your hands with soap and water. Remove old dressing, discard into plastic bag and place into trash. Cleanse the wound with Wound Cleanser prior to applying [name] clean dressing using gauze sponges, not tissues or cotton balls. Do not scrub or use excessive force. Pat dry using gauze sponges, not tissue or cotton balls. Topical: Gentamicin 1 x Per Day/30 Days Discharge Instructions: Apply as directed by provider. in clinic Topical: keystone gel 1 x Per Day/30 Days Discharge Instructions: at home Prim Dressing: Aquacel Extra Hydrofiber Dressing, 2x2 (in/in) ary 1 x Per Day/30 Days Secondary Dressing: ABD Pad 5x9 (in/in) (Generic) 1 x Per Day/30 Days Discharge Instructions: Cover with ABD pad Secured With: Medipore T - 40M Medipore H Soft Cloth Surgical T ape ape, 2x2 (in/yd) 1 x Per Day/30 Days Secured With: [employer] Sterile or Non-Sterile 6-ply 4.5x4 (yd/yd) 1 x Per Day/30 Days Discharge Instructions: Apply Kerlix as directed Wound #12 - Foot Wound Laterality: Plantar, Left, Medial Cleanser: Wound Cleanser 1 x Per Day/30 Days Discharge Instructions: Wash your hands with soap and water. Remove old dressing, discard into plastic bag and place into trash. Cleanse the wound with Wound Cleanser prior to applying [name] clean dressing using gauze sponges, not tissues or cotton balls. Do not scrub or use excessive force. Pat dry using gauze sponges, not tissue or cotton balls. Topical: Gentamicin 1 x Per Day/30 Days Discharge Instructions: Apply as  directed by provider. in clinic Topical: keystone gel 1 x Per Day/30 Days Discharge Instructions: at home Prim Dressing:  Aquacel Extra Hydrofiber Dressing, 2x2 (in/in) ary 1 x Per Day/30 Days Secondary Dressing: ABD Pad 5x9 (in/in) (Generic) 1 x Per Day/30 Days Discharge Instructions: Cover with ABD pad Secured With: Medipore T - 56M Medipore H Soft Cloth Surgical T ape ape, 2x2 (in/yd) 1 x Per Day/30 Days SAARA, CABANILLA [name] ([id number]) [phone].pdf Page 10 of 20 Secured With: [employer] or Non-Sterile 6-ply 4.5x4 (yd/yd) 1 x Per Day/30 Days Discharge Instructions: Apply Kerlix as directed [place] [employer] DME provider, dressing supplies - Apligraf for next visit Electronic Signature(s) Signed: 12/01/2022 6:29:11 PM By: Worthy Keeler PA-C Signed: 12/02/2022 1:49:05 PM By: Gretta Cool, BSN, RN, CWS, [name] RN, BSN Entered By: Gretta Cool, BSN, RN, CWS, [name] on 12/01/2022 13:31:29 Prescription 12/01/2022 -------------------------------------------------------------------------------- [name] Jeri Cos PA-C Patient Name: Provider: June 15, 1976 [id number] Date of Birth: NPI#: [id number] Sex: DEA #: [phone] Phone #: License #: [place] Patient Address: [facility] [place], [place] [address] [address] [place], [place] [address] [phone] Allergies erythromycin base; Neosporin (neo-bac-polym) Provider's Orders [place] for custom shoes Hand Signature: Date(s): Electronic Signature(s) Signed: 12/01/2022 6:29:11 PM By: Worthy Keeler PA-C Signed: 12/02/2022 1:49:05 PM By: Gretta Cool, BSN, RN, CWS, [name] RN, BSN Entered By: Gretta Cool, BSN, RN, CWS, [name] on 12/01/2022 13:31:30 DAICY, BEUS [name] ([id number]) [id number] Page 11 of 20 -------------------------------------------------------------------------------- Problem List Details Patient Name: Date of Service: TASHANTI, WIERMAN 12/01/2022 12:30 PM Medical Record Number: [id number] Patient Account Number: [id number] Date of Birth/Sex: Treating RN: December 23, 1975 (47 y.o. 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 12/01/2022 12:30 PM Medical Record Number: [id number] Patient Account Number: [id number] Date of Birth/Sex: Treating RN: May 30, 1976 (47 y.o. [name] Primary Care Provider: Raelene Bott Other Clinician: Referring Provider: Treating Provider/Extender: Mariana Single in Treatment: 37 Subjective Chief Complaint Information obtained from Patient 03/19/2021; patient referred by Dr. Luana Shu who has been looking after her left foot for quite [name] period of time for review of [name] nonhealing area in the left midfoot 03/12/2022; bilateral feet wounds and right lower extremity wound. RISSIE, PERRIN [name] ([id number]) [id number] Page 12 of 20 History of Present Illness (HPI) 01/18/18-She is here for  initial evaluation of the left great toe ulcer. She is [name] poor historian in regards to timeframe in detail. She states approximately 4 weeks ago she lacerated her toe on something in the house. She followed up with her primary care who placed her on  Bactrim and ultimately [name] second dose of Bactrim prior to coming to wound clinic. She states she has been treating the toe with peroxide, Betadine and [name] Band-Aid. She did not check her blood sugar this morning but checked it yesterday morning it was 327; she is unaware of [name] recent A1c and there are no current records. She saw Dr. she would've orthopedics last week for an old injury to the left ankle, she states he did not see her toe, nor did she bring it to his attention. She smokes approximately 1 pack cigarettes [name] day. Her social situation is concerning, she arrives this morning with her mother who appears extremely intoxicated/under the influence; her mother was asked to leave the room and be monitored by the patient's grandmother. The patient's aunt then accompanied the patient and the room throughout the rest of the appointment. We had [name] lengthy discussion regarding the deleterious effects of uncontrolled hyperglycemia and smoking as it relates to wound healing and overall health. She was strongly encouraged to decrease her smoking and get her diabetes under better control. She states she is currently on [name] diet and has cut down her [facility] consumption. The left toe is erythematous, macerated and slightly edematous with malodor present. The edema in her left foot is below her baseline, there is no erythema streaking. We will treat her with Santyl, doxycycline; we have ordered and xray, culture and provided [name] Peg assist surgical shoe and cultured the wound. 01/25/18-She is here in follow-up evaluation for [name] left great toe ulcer and presents with an abscess to her suprapubic area. She states her blood sugars remain elevated, feeling "sick" and if levels  are below 250, but she is trying. She has made no attempt to decrease her smoking stating that we "can't take away her food in her cigarettes". She has been compliant with offloading using the PEG assist you. She is using Santyl daily. the culture obtained last week grew staph aureus and Enterococcus faecalis; continues on the doxycycline and Augmentin was added on Monday. The suprapubic area has erythema, no femoral variation, purple discoloration, minimal induration, was accessed with [name] cotton tip applicator with sanguinopurulent drainage, this was cultured, I suspect the current antibiotic treatment will cover and we will not add anything to her current treatment plan. She was advised to go to urgent care or ER with any change in redness, induration or fever. 02/01/18-She is here in follow-up evaluation for left great toe ulcers and [name] new abdominal abscess from last week. She was able to use packing until earlier this week, where she "forgot it was there". She states she was feeling ill with GI symptoms last week and was not taking her antibiotic. She states her glucose levels have been predominantly less than 200, with occasional levels between 200-250. She thinks this was contributing to her GI symptoms as they have resolved without intervention. There continues to be significant laceration to left toe, otherwise it clinically looks stable/improved. There is now less superficial opening to the lateral aspect of the great toe that was residual blister. We will transition to [facility] to all wounds, she will continue her Augmentin. If there is no change or deterioration next week for reculture. 02/08/18-She is here in follow-up evaluation for left great toe ulcer and abdominal ulcer. There is an improvement in both wounds. She has been wrapping her left toe with coban, not by our direction, which has created an area of discoloration to the medial aspect; she has been  advised to NOT use coban  secondary to her neuropathy. She states her glucose levels have been high over this last week ranging from 200-350, she continues to smoke. She admits to being less compliant with her offloading shoe. We will continue with same treatment plan and she will follow-up next week. 02/15/18-She is here in follow-up evaluation for left great toe ulcer and abdominal ulcer. The abdominal ulcer is epithelialized. The left great toe ulcer is improved and all injury from last week using the Coban wrap is resolved, the lateral ulcer is healed. She admits to noncompliance with wearing offloading shoe and admits to glucose levels being greater than 300 most of the week. She continues to smoke and expresses no desire to quit. There is one area medially that probes deeper than it has historically, erythema to the toe and dorsal foot has consistently waxed and waned. There is no overt signs of cellulitis or infection but we will culture the wound for any occult infection given the new area of depth and erythema. We will hold off on sensitivities for initiation of antibiotic therapy. 02/22/18-She is here in follow up evaluation for left great toe ulcer. There is overall significant improvement in both wound appearance, erythema and edema with changes made last week. She was not initiated on antibiotic therapy. Culture obtained last week showed oxacillin sensitive staph aureus, sensitive to clindamycin. Clindamycin has been called into the pharmacy but she has been instructed to hold off on initiation secondary to overall clinical improvement and her history of antibiotic intolerance. She has been instructed to contact the clinic with any noted changes/deterioration and the wound, erythema, edema and/or pain. She will follow-up next week. She continues to smoke and her glucose levels remain elevated >250; she admits to compliance with offloading shoe 03/01/18 on evaluation today patient appears to be doing fairly well in  regard to her left first toe ulcer. She has been tolerating the dressing changes with the [facility] Dressing without complication and overall this has definitely showed signs of improvement according to records as well is what the patient tells me today. I'm very pleased in that regard. She is having no pain today 03/08/18 She is here for follow up evaluation of [name] left great toe ulcer. She remains non-compliant with glucose control and smoking cessation; glucose levels consistently >200. She states that she got new shoe inserts/peg assist. She admits to compliance with offloading. Since my last evaluation there is significant improvement. We will switch to prisma at this time and she will follow up next week. She is noted to be tachycardic at this appointment, heart rate 120s; she has [name] history of heart rate 70-130 according to our records. She admits to extreme agitation r/t personal issues; she was advised to monitor her heartrate and contact her physician if it does not return to [name] more normal range (<100). She takes cardizem twice daily. 03/15/18-She is here in follow-up evaluation for left great toe ulcer. She remains noncompliant with glucose control and smoking cessation. She admits to compliance with wearing offloading shoe. The ulcer is improved/stable and we will continue with the same treatment plan and she will follow-up next week 03/22/18-She is here for evaluation for left great toe ulcer. There continues to be significant improvement despite recurrent hyperglycemia (over 500 yesterday) and she continues to smoke. She has been compliant with offloading and we will continue with same treatment plan and she will follow-up next week. 03/29/18-She is here for evaluation for left great toe  ulcer. Despite continuing to smoke and uncontrolled diabetes she continues to improve. She is compliant with offloading shoe. We will continue with the same treatment plan and she will follow-up next  week 04/05/18- She is here in follow up evaluation for [name] left great toe ulcer; she presents with small pustule to left fifth toe (resembles ant bite). She admits to compliance with wearing offloading shoe; continues to smoke or have uncontrolled blood glucose control. There is more callus than usual with evidence of bleeding; she denies known trauma. 04/12/18-She is here for evaluation of left great toe ulcer. Despite noncompliance with glycemic control and smoking she continues to make improvement. She continues to wear offloading shoe. The pustule, that was identified last week, to the left fifth toe is resolved. She will follow-up in 2 weeks 05/03/18-she is seen in follow-up evaluation for [name] left great toe ulcer. She is compliant with offloading, otherwise noncompliant with glycemic control and smoking. She has plateaued and there is minimal improvement noted. We will transition to [facility], replaced the insert to her surgical shoe and she will follow-up in one week 05/10/18- She is here in follow up evaluation for [name] left great toe ulcer. It appears stable despite measurement change. We will continue with same treatment plan and follow up next week. 05/24/18-She is seen in follow-up evaluation for [name] left great toe ulcer. She remains compliant with offloading, has made significant improvement in her diet, decreasing the amount of sugar/soda. She said her recent A1c was 10.9 which is lower than. She did see [name] diabetic nutritionist/educator yesterday. She continues to smoke. We will continue with the same treatment plan and she'll follow-up next week. 05/31/18- She is seen in follow-up evaluation for left great toe ulcer. She continues to remain compliant with offloading, continues to make improvement in her diet, increasing her water and decreasing the amount of sugar/soda. She does continue to smoke with no desire to quit. We will apply Prisma to the depth and Hydrofera Blue over. We have not  received insurance authorization for oasis. She will follow up next week. 06/07/18-She is seen in follow-up evaluation for left great toe ulcer. It has stalled according to today's measurements although base appears stable. She says she saw [name] diabetic educator yesterday; her average blood sugars are less than 300 which is an improvement for her. She continues to smoke and states "that's my next step" She continues with water over soda. We will order for xray, culture and reinstate ace wrap compression prior to placing apligraf for next week. She is voicing no complaints or concerns. Her dressing will change to iodoflex over the next week in preparation for apligraf. 06/14/18-She is seen in follow-up evaluation for left great toe ulcer. Plain film x-ray performed last week was negative for osteomyelitis. Wound culture obtained last week grew strep B and OSSA; she is initiated on keflex and cefdinir today; there is erythema to the toe which could be from ace wrap compression, she has [name] history of wrapping too tight and has has been encouraged to maintain ace wraps that we place today. We will hold off on application of apligraf today, will apply next week after antibiotic therapy has been initiated. She admits today that she has resumed taking [name] shower with her foot/toe submerged in water, she has been reminded to keep foot/toe out of the bath water. She will be seen in follow up next week 06/21/18-she is seen in follow-up evaluation for left great toe ulcer. She is tolerating  antibiotic therapy with no GI disturbance. The wound is stable. Apligraf was applied today. She has been decreasing her smoking, only had 4 cigarettes yesterday and 1 today. She continues being more compliant in diabetic diet. She will follow-up next week for evaluation of site, if stable will remove at 2 weeks. 06/28/18- She is here in follow up evalution. Apligraf was placed last week, she states the dressing fell off on Tuesday and  she was dressing with hydrofera blue. She is healed and will be discharged from the clinic today. She has been instructed to continue with smoking cessation, continue monitoring glucose levels, offloading for an additional 4 weeks and continue with hydrofera blue for additional two weeks for any possible microscopic opening. Readmission: ZISEL, LOVELACE ([id number]) [id number].pdf Page 13 of 20 08/07/18 on evaluation today patient presents for reevaluation concerning the ulcer of her right great toe. She was previously discharged on 06/28/18 healed. Nonetheless she states that this began to show signs of drainage she subsequently went to her primary care provider. Subsequently an x-ray was performed on 08/01/18 which was negative. The patient was also placed on antibiotics at that time. Fortunately they should have been effective for the infection. Nonetheless she's been experiencing some improvement but still has [name] lot of drainage coming from the wound itself. 08/14/18 on evaluation today patient's wound actually does show signs of improvement in regard to the erythema at this point. She has completed the antibiotics. With that being said we did discuss the possibility of placing her in [name] total contact cast as of today although I think that I may want to give this just [name] little bit more time to ensure nothing recurrence as far as her infection is concerned. I do not want to put in the cast and risk infection at that time if things are not completely resolved. With that being said she is gonna require some debridement today. 08/21/18 on evaluation today patient actually appears to be doing okay in regard to her toe ulcer. She's been tolerating the dressing changes without complication. With that being said it does appear that she is ready and in fact I think it's appropriate for [place] to go ahead and initiate the total contact cast today. Nonetheless she will require some sharp  debridement to prepare the wound for application. Overall I feel like things have been progressing well but we do need to do something to get this to close more readily. 08/24/18 patient seen today for reevaluation after having had the total contact cast applied on Tuesday. She seems to have done very well the wound appears to be doing great and overall I'm pleased with the progress that she's made. There were no abnormal areas of rubbing from the cast on her lower extremity. 08/30/18 on evaluation today patient actually appears to be completely healed in regard to her plantar toe ulcer. She tells me at this point she's been having [name] lot of issues with the cast. She almost fell [name] couple of times the state shall the step of her dog [name] couple times as well. This is been [name] very frustrating process for her other nonetheless she has completely healed the wound which is excellent news. Overall there does not appear to be the evidence of infection at this time which is great news. 09/11/18 evaluation today patient presents for follow-up concerning her great toe ulcer on the left which has unfortunately reopened since I last saw her which was only [name] couple of weeks ago. Unfortunately  she was not able to get in to get the shoe and potentially the AFO that's gonna be necessary due to her left foot drop. She continues with offloading shoe but this is not enough to prevent her from reopening it appears. When we last had her in the total contact cast she did well from [name] healing standpoint but unfortunately the wound reopened as soon as she came out of the cast within just [name] couple of weeks. 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 This is also part of the reason why she falls on [name] regular basis. I just do not believe that  is gonna be safe for [place] to reinitiate the total contact cast as last time we had this on she fell 3 times one week which is definitely not normal for her. 09/18/18 upon evaluation today the patient actually appears to be doing about the same in regard to her toe ulcer. She did not contact [employer] as I asked her to even though I had given her the prescription. In fact she actually states that she has no idea where the prescription is. She did apparently call [employer] and they told her that all she needed to do was bring the prescription in order to be able to be seen and work on getting the AFO for her left foot. With all that being said she still does not have an appointment and I'm not sure were things stand that regard. I will give her [name] new prescription today in order to contact them to get this set up. 09/25/18 on evaluation today patient actually appears to be doing about the same in regard to her toes ulcer. She does have [name] small areas which seems to have [name] lot of callous buildup around the edge of the wound which is going to need sharp debridement today. She still is waiting to be scheduled for evaluation with [facility] for possibility of an AFO. She states there supposed to call her tomorrow to get this set up. Unfortunately it does appear that her foot specifically the toe area is showing signs of erythema. There does not appear to be any systemic infection which is in these good news. 10/02/18 on evaluation today patient actually appears to be doing about the same in regard to her toe ulcer. This really has not done too well although it's not significantly larger it's also not significantly smaller. She has been tolerating the dressing changes without complication. She actually has her appointment with [facility] and [place] tomorrow to hopefully be measured for obtaining and AFO splint. I think this would be helpful preventing this from reoccurring. We had contemplated starting the cast this week  although to be honest I am reluctant to do that as she's been having nausea, vomiting, and seizure activity over the past three days. She has [name] history of seizures and have been told is nothing that can be done for these. With that being said I do believe that along with the seizures have the nausea vomiting which upon further questioning doesn't seem to be the normal for her and makes me concerned for the possibility of infection or something else going on. I discussed this with the patient and her mother during the office visit today. I do not feel the  wound is effective but maybe something else. The responses this was "this just happens to her at times and we don't know why". They did not seem to be interested in going to the hospital to have this checked out further. 10/09/18 on evaluation today patient presents for follow-up concerning her ongoing toe ulcer. She has been tolerating the dressing changes without complication. Fortunately there does not appear to be any evidence of infection which is great news however I do think that the patient would benefit from going ahead for with the total contact cast. She's actually in [name] wheelchair today she tells me that she will use her walker if we initiate the cast. I was very specific about the fact that if we were gonna do the cast I wanted to make sure that she was using the walker in order to prevent any falls. She tells me she does not have stairs that she has to traverse on [name] regular basis at her home. She has not had any seizures since last week again that something that happens to her often she tells me she did talk to [employer] and they said that it may take up to three weeks to get the brace approved for her. Hopefully that will not take that long but nonetheless in the meantime I do think the cast could be of benefit. 10/12/18 on evaluation today patient appears to be doing rather well in regard to her toe ulcer. It's just been [name] few days and  already this is significantly improved both as far as overall appearance and size. Fortunately there's no sign of infection. She is here for her first obligatory cast change. 10/19/18 Seen today for follow up and management of left great toe ulcer. Wound continues to show improvement. Noted small open area with seroussang drainage with palpation. Denies any increased pain or recent fevers during visit. She will continue calcium alginate with offloading shoe. Denies any questions or concerns during visit. 10/26/18 on evaluation today patient appears to be doing about the same as when I last saw her in regard to her wound bed. Fortunately there does not appear to be any signs of infection. Unfortunately she continues to have [name] breakdown in regard to the toe region any time that she is not in the cast. It takes almost no time at all for this to happen. Nonetheless she still has not heard anything from the brace being made by [employer] as to when exactly this will be available to her. Fortunately there is no signs of infection at this time. 10/30/18 on evaluation today patient presents for application of the total contact cast as we just received him this morning. Fortunately we are gonna be able to apply this to her today which is great news. She continues to have no significant pain which is good news. Overall I do feel like things have been improving while she was the cast is when she doesn't have [name] cast that things get worse. She still has not really heard anything from [place] regarding her brace. 11/02/18 upon evaluation today patient's wound already appears to be doing significantly better which is good news. Fortunately there does not appear to be any signs of infection also good news. Overall I do think the total contact cast as before is helping to heal this area unfortunately it's just not gonna likely keep the area closed and healed without her getting her brace at least. Again the foot drop is [name]  significant issue for her. 11/09/18  on evaluation today patient appears to be doing excellent in regard to her toe ulcer which in fact is completely healed. Fortunately we finally got the situation squared away with the paperwork which was needed to proceed with getting her brace approved by Medicaid. I have filled that out unfortunately that information has been sent to the orthopedic office that I worked at 2 1/2 years ago and not tired Current wound care measures. Fortunately she seems to be doing very well at this time. 11/23/18 on evaluation today patient appears to be doing More Poorly Compared to Last Time I Saw Her. At [facility] She Had Completely Healed. Currently she is continuing to have issues with reopening. She states that she just found out that the brace was approved through Medicaid now she just has to go get measured in order to have this fitted for her and then made. Subsequently she does not have an appointment for this yet that is going to complicate things we obviously cannot put her back in the cast if we do not have everything measured because they're not gonna be able to measure her foot while she is in the cast. 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 Obviously this is unfortunate and does have me somewhat worried as well. SANVITHA, NAPLES [name] ([id number]) [id number] Page 14 of 20 11/30/18 on evaluation today patient's toe ulcer actually appears to be doing fairly well. The good news is she will be getting her brace in the shoes next week on Wednesday. Hopefully we will be able to get this to heal without having to go back in the cast however she may need the cast in order to get the wound completely heal and then go from there. Fortunately there's no signs of infection at this time. 12/07/18 on evaluation today patient fortunately did receive her brace  and she states she could tell this definitely makes her walk better. With that being said she's been having issues with her toe where she noticed yesterday there was [name] lot of tissue that was loosing off this appears to be much larger than what it was previous. She also states that her leg has been read putting much across the top of her foot just about the ankle although this seems to be receiving somewhat. The total area is still red and appears to be someone infected as best I can tell. She is previously taken Bactrim and that may be [name] good option for her today as well. We are gonna see what I wound culture shows as well and I think that this is definitely appropriate. With that being said outside of the culture I still need to initiate something in the interim and that's what I'm gonna go ahead and select Bactrim is [name] good option for her. 12/14/18 on evaluation today patient appears to be doing better in regard to her left great toe ulcer as compared to last week's evaluation. There's still some erythema although this is significantly improved which is excellent news. Overall I do believe that she is making good progress is still gonna take some time before she is where I would like her to be from the standpoint of being able to place her back into the total contact cast. Hopefully we will be where we need to be by next week. 12/21/18 on evaluation today patient actually appears to be doing poorly in  regard to her toe ulcer. She's been tolerating the dressing changes without complication. Fortunately there's no signs of systemic infection although she does have [name] lot of drainage from the toe ulcer and this does seem to be causing some issues at this point. She does have erythema on the distal portion of her toe that appears to be likely cellulitis. 12/28/18 on evaluation today patient actually appears to be doing [name] little better in my pinion in regard to her toe ulcer. With that being said she still does  have some evidence of infection at this time and for her culture she had both E. coli as well as enterococcus as organisms noted on evaluation. For that reason I think that though the Keflex likely has treated the E. coli rather well this has really done nothing for the enterococcus. We are going to have to initiate treatment for this specifically. 01/04/19 on evaluation today patient's toe actually appears to be doing better from the standpoint of infection. She currently would like to see about putting the cash back on I think that this is appropriate as long as she takes care of it and keeps it from getting wet. She is gonna have some drainage we can definitely pass this up with Drawtex and alginate to try to prevent as much drainage as possible from causing the problems. With that being said I do want to at least try her with the cast between now and Tuesday. If there any issues we can't continue to use it then I will discontinue the use of the cast at that point. 01/08/19 on evaluation today patient actually appears to be doing very well as far as her foot ulcer specifically the great toe on the left is concerned. She did have an area of rubbing on the medial aspect of her left ankle which again is from the cast. Fortunately there's no signs of infection at this point in this appears to be [name] very slight skin breakdown. The patient tells me she felt it rubbing but didn't think it was that bad. Fortunately there is no signs of active infection at this time which is good news. No fevers, chills, nausea, or vomiting noted at this time. 01/15/19 on evaluation today patient actually appears to be doing well in regard to her toe ulcer. Again as previous she seems to do well and she has the cast on which indicates to me that during the time she doesn't have [name] cast on she's putting way too much pressure on this region. Obviously I think that's gonna be an issue as with the current national emergency  concerning the Covid-19 Virus it has been recommended that we discontinue the use of total contact casting by the chief medical officer of our company, Dr. Simona Huh. The reasoning is that if [name] patient becomes sick and cannot come into have the cast removed they could not just leave this on for an additional two weeks. Obviously the hospitals also do not want to receive patient's who are sick into the emergency department to potentially contaminate the region and spread the Covid-19 Virus among other sick individuals within the hospital system. Therefore at this point we are suspending the use of total contact cast until the current emergency subsides. This was all discussed with the patient today as well. 01/22/19 on evaluation today patient's wound on her left great toe appears to be doing slightly worse than previously noted last week. She tells me that she has been on this quite [name]  bit in fact she tells me she's been awake for 38 straight hours. This is due to the fact that she's having to care for grandparents because nobody else will. She has been taking care of them for five the last seven days since I've seen her they both have dementia his is from [name] stroke and her grandmother's was progressive. Nonetheless she states even her mom who knows her condition and situation has only help two of those days to take care of them she's been taking care of the rest. Fortunately there does not appear to be any signs of active infection in regard to her toe at this point although obviously it doesn't look as good as it did previous. I think this is directly related to her not taking off the pressure and friction by way of taking things easy. Though I completely understand what's going on. 01/29/19 on evaluation today patient's tools are actually appears to be showing some signs of improvement today compared to last week's evaluation as far as not necessarily the overall size of the wound but the fact that she has  some new skin growth in between the two ends of the wound opening. Overall I feel like she has done well she states that she had [name] family member give her what sounds to be [name] CAM walker boot which has been helpful as well. 02/05/19 on evaluation today patient's wound bed actually appears to be doing significantly better in regard to her overall appearance of the size of the wound. With that being said she is still having an issue with offloading efficiently enough to get this to close. Apparently there is some signs of infection at this point as well unfortunately. Previously she's done well of Augmentin I really do not see anything that needs to be culture currently but there theme and cellulitis of the foot that I'm seeing I'm gonna go ahead and place her on an antibiotic today to try to help clear this up. 02/12/2019 on evaluation today patient actually appears to be doing poorly in regard to her overall wound status. She tells me she has been using her offloading shoe but actually comes in today wearing her tennis shoe with the AFO brace. Again as I previously discussed with her this is really not sufficient to allow the area to heal appropriately. Nonetheless she continues to be somewhat noncompliant and I do wonder based on what she has told my nurse in the past as to whether or not [name] good portion of this noncompliance may be recreational drug and alcohol related. She has had [name] history of heroin overdose and this was fairly recently in the past couple of months that have been seeing her. Nonetheless overall I feel like her wound looks significantly worse today compared to what it was previous. She still has significant erythema despite the Augmentin I am not sure that this is an appropriate medication for her infection I am also concerned that the infection may have gone down into her bone. 02/19/19 on evaluation today patient actually appears to be doing about the same in regard to her toe ulcer.  Unfortunately she continues to show signs of bone exposure and infection at this point. There does not appear to be any evidence of worsening of the infection but I'm also not really sure that it's getting significantly better. She is on the Augmentin which should be sufficient for the Staphylococcus aureus infection that she has at this point. With that being said she  may need IV antibiotics to more appropriately treat this. We did have [name] discussion today about hyperbaric option therapy. 02/28/19 on evaluation today patient actually appears to be doing much worse in regard to the wound on her left great toe as compared to even my previous evaluation last week. Unfortunately this seems to be training in [name] pretty poor direction. Her toe was actually now starting to angle laterally and I can actually see the entire joint area of the proximal portion of the digit where is the distal portion of the digit again is no longer even in contact with the joint line. Unfortunately there's [name] lot more necrotic tissue around the edge and the toe appears to be showing signs of becoming gangrenous in my pinion. I'm very concerned about were things stand at this point. She did see infectious disease and they are planning to send in [name] prescription for Sivextro for her and apparently this has been approved. With that being said I don't think she should avoid taking this but at the same time I'm not sure that it's gonna be sufficient to save her toe at this point. She tells me that she still having to care for grandparents which I think is putting quite [name] bit of strain on her foot and specifically the total area and has caused this to break down even to [name] greater degree than would've otherwise been expected. 03/05/19 on evaluation today patient actually appears to be doing quite well in regard to her toe all things considering. She still has bone exposed but there appears to be much less your thing on overall the appearance of  the wound and the toe itself is dramatically improved. She still does have some issues currently obviously with infection she did see vascular as well and there concerned that her blood flow to the toad. For that reason they are setting up for an angiogram next week. 03/14/19 on evaluation today patient appears to be doing very poor in regard to her toe and specifically in regard to the ulceration and the fact that she's starting to notice the toe was leaning even more towards the lateral aspect and the complete joint is visible on the proximal aspect of the joint. Nonetheless she's also noted [name] significant odor and the tip of the toe is turning more dark and necrotic appearing. Overall I think she is getting worse not better as far as this is concerned. For that reason I am recommending at this point that she likely needs to be seen for likely amputation. PNINA, ARMAO [name] ([id number]) [id number] Page 15 of 20 READMISSION 03/19/2021 This is [name] patient that we cared for in this clinic for [name] prolonged period of time in 2019 and 2020 with [name] left foot and left first toe wound. 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 Luana Shu. In December 2021 she had an ulcer on her right great toe as well as the fourth and fifth toes. 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 She also had an angiogram at that time and underwent angioplasty of the right anterior tibial artery. In any case she claims that the wound on the right foot is closed I did not look at this today which was probably an oversight although I think that should be done next week. After her surgery she developed [name] dehiscence but I  do not see any follow-up of this. According to Dr. Deborra Medina last review that she was out of the area being cared for by another physician  but recently came back to his attention. 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 [name] culture of this area showed E. coli apparently before she came back to see Dr. Luana Shu she was supposed to be receiving antibiotics but she did not really take them. Nor is she offloading this area at all. Finally her last hemoglobin A1c listed in epic was in March 2022 at 14.1 she says things are [name] lot better since then although I am not sure. 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 She had this wound at the time. During this admission she had E. coli sepsis [name] TEE was negative. 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 She is still [name] heavy cigarette smoker. She has [name] history of bipolar disorder seizure disorder ABI in our clinic was 1.05 6/1; left midfoot in the setting of [name] TMA done previously. Round circular wound with [name] "knuckle" of protruding tissue. The problem is that the knuckle was not attached to any of the surrounding granulation and this probed proximally widely I removed [name] large portion of this tissue. This wound goes with considerable undermining laterally. I do not feel any bone there was no purulence but this is [name] deep wound. 6/8; in spite of the debridement I did last week. She arrives with [name] wound looking exactly the same. [name] protruding "knuckle" of tissue nonadherent to most of the surrounding tissue. There is considerable depth around this from 6-12 o'clock at 2.7 cm and undermining of 1 cm. This does not look overtly infected and the x- ray I did last week was negative for any osseous abnormalities. We have been using silver collagen 6/15;  deep tissue culture I did last week showed moderate staph aureus and moderate Pseudomonas. This will definitely require prolonged antibiotic therapy. The pathology on the protuberant area was negative for malignancy fungus etc. 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 We have been using silver collagen. I am going to be prescribing Levaquin for 2 weeks. Her CT scan of the foot is down for 7/5 6/22; CT scan of the foot on 7 5. She says she has hardware in the left leg from her previous fracture. She is on the Levaquin for the deep tissue culture I did that showed methicillin sensitive staph aureus and Pseudomonas. I gave her [name] 2-week supply and she will have another week. She arrives in clinic today with the same protuberant tissue however this is nonadherent to the tissue surrounding it. I am really at [name] loss to explain this unless there is underlying deep tissue infection 6/29; patient presents for 1 week follow-up. She has been using collagen to the wound bed. She reports taking her antibiotics as prescribed.She has no complaints or issues today. She denies signs of infection. 7/6; patient presents for one week followup. She has been using collagen to the wound bed. She states she is taking Levaquin however at times she is not able to keep it down. She denies signs of infection. 7/13; patient presents for 1 week  follow-up. She has been using silver alginate to the wound bed. She still has nausea when taking her antibiotics. She denies signs of infection. 7/20; patient presents for 1 week follow-up. She has been using silver alginate with gentamicin cream to the wound bed. She denies any issues and has no complaints today. She denies signs of infection. 7/27; patient presents for 1 week follow-up. She continues to use silver alginate with gentamicin cream to the wound bed. She reports starting her antibiotics. She has no issues or  complaints. Overall she reports stability to the wound. 8/3; patient presents for 1 week follow-up. She has been using silver alginate with gentamicin cream to the wound bed. She reports completing all antibiotics. She has no issues or complaints today. She denies signs of infection. 8/17; patient presents for 2-week follow-up. He is to use silver alginate to the wound bed. She has no issues or complaints today. She denies signs of infection. She reports her pain has improved in her foot since last clinic visit 8/24; patient presents for 1 week follow-up. She continues to use silver alginate to the wound bed. She has no issues or complaints. She denies signs of infection. Pain is stable. 9/7; patient presents for follow-up. She missed her last week appointment due to feeling ill. She continues to use silver alginate. She has [name] new wound to the right lower extremity that is covered in eschar. She states It occurred over the past week and has no idea how it started. She currently denies signs of infection. 9/14; patient presents for follow-up. T the left foot wound she has been using gentamicin cream and silver alginate. T the right lower extremity wound she has o o been keeping this covered and has not obtain Santyl. 9/21; patient presents for follow-up. She reports using gentamicin cream and silver alginate to the left foot and Santyl to the right lower extremity wound. She has no issues or complaints today. She denies signs of infection. 9/28; patient presents for follow-up. She reports [name] new wound to her right heel. She states this occurred [name] few days ago and is progressively gotten worse. She has been trying to clean the area with [name] Q-tip and Santyl. She reports stability in the other 2 wounds. She has been using gentamicin cream and silver alginate to the left foot and Santyl to the right lower extremity wound. 10/12; patient presents for follow-up. She reports improvement to the wound beds.  She is seeing vein and vascular to discuss the potential of [name] left BKA. She states they are going to do an arteriogram. She continues to use silver alginate with dressing changes to her wounds. 11/2; patient presents for follow-up. She states she has not been doing dressing changes to the wound beds. She states she is not able to offload the areas. She reports chronic pain to her left foot wound. 11/9; patient presents for follow-up. She came in with only socks on. She states she forgot to put on shoes. It is unclear if she is doing any dressing changes. She currently denies systemic signs of infection. 11/16; patient presents for follow-up. She came again only with socks on. She states she does not wear shoes ever. It is unclear if she does dressing changes. She currently denies systemic signs of infection. 11/23; patient presents for follow-up. She wore her shoes today. It still unclear exactly what dressing she is using for each wound but she did states she obtained Dakin's solution and has been using this to  the left foot wound. She currently denies signs of infection. 11/30; patient presents for follow-up. She has no issues or complaints today. She currently denies signs of infection. 12/7; patient presents for follow-up. She has no issues or complaints today. She has been using Hydrofera Blue to the right heel wound and Dakin solution to the left foot wound. Her right anterior leg wound is healed. She currently denies signs of infection. 12/14; patient presents for follow-up. She has been using Hydrofera Blue to the right heel and Dakin's to the left foot wounds. She has no issues or complaints today. She denies signs of infection. 12/21; patient presents for follow-up. She reports using Hydrofera Blue to the right heel and Dakin's to the left foot wound. She denies signs of infection. 12/28; patient presents for follow-up. She continues to use Dakin's to the left foot wound and Hydrofera Blue  to the right heel wound. She denies signs of infection. TYESHA, GRASSEL [name] ([id number]) [id number] Page 16 of 20 1/4; patient presents for follow-up. She has no issues or complaints today. She denies signs of infection. 1/11; patient presents for follow-up. It is unclear if she has been dressing these wounds over the past week. She currently denies signs of infection. 1/18; patient presents for follow-up. She states she has been using Dakin's wet-to-dry dressings to the left foot. She has been using Hydrofera Blue to the right foot foot wound. She states that the anterior right leg wound has reopened and draining serous fluid. She denies signs of infection. 1/25; patient presents for follow-up. She has no issues or complaints today. 2/1; patient presents for follow-up. She has no issues or complaints today. She denies signs of infection. 2/8; patient presents for follow-up. She has lost her surgical shoes. She did not have [name] dressing to the right heel wound. She currently denies signs of infection. 2/15; patient presents for follow-up. She reports more pain to the right heel today. She denies purulent drainage Or fever/chills 2/22; patient presents for follow-up. She reports taking clindamycin over the past week. She states that she continues to have pain to her right heel. She reports purulent drainage. Readmission 03/16/2022 Ms. Ambrielle Ivery is [name] 47 year old female with [name] past medical history of type 2 diabetes, osteomyelitis to her feet, chronic systolic heart failure and bipolar disorder that presents to the clinic for bilateral feet wounds and right lower extremity wound. She was last seen in our clinic on 12/15/2021. At that time she had purulent drainage coming out of her right plantar foot and I recommended she go to the ED. She states she went to [facility] and has been there for the past 3 months. I cannot see the records. She states she had OR  debridement and was on several weeks of IV antibiotics while inpatient. Since discharge she has not been taking care of the wound beds. She had nothing on her feet other than socks today. She currently denies signs of infection. 5/31; patient presents for follow-up. She has been using Dakin's wet-to-dry dressings to the wound beds on her feet bilaterally and antibiotic ointment to the right anterior leg wound. She had [name] wound culture done at last clinic visit that showed moderate Pseudomonas aeruginosa sensitive to ciprofloxacin. She currently denies systemic signs of infection. 6/14; patient presents for follow-up. She received Keystone 5 days ago and has been using this on the wound beds. She states that last week she had to go to the hospital because she had increased warmth and  erythema to the right foot. She was started on 2 oral antibiotics. She states she has been taking these. She currently denies systemic signs of infection. She has no issues or complaints today. 6/21; patient presents for follow-up. She states she has been using Keystone antibiotics to the wound beds. She has no issues or complaints today. She denies signs of infection. 6/28; patient presents for follow-up. She has been using Keystone antibiotics to the wound beds. She has no issues or complaints today. 7/12; patient presents for follow-up. Has been using Keystone antibiotics to the wound beds with calcium alginate. She has no issues or complaints today. She never followed up with her orthopedic surgeon who did the OR debridement to the right foot. We discussed the total contact cast for the left foot and patient would like to do this next week. 7/19; patient presents for follow-up. She has been using Keystone antibiotics with calcium alginate to the wound beds. She has no issues or complaints today. Patient is in agreement to do the total contact cast of the left foot today. She knows to return later this week for the  obligatory cast change. 05-13-2022 upon evaluation today patient's wound which she has the cast of the left leg actually appears to be doing significantly better. Fortunately I do not see any signs of active infection locally or systemically which is great news and overall I am extremely pleased with where we stand currently. 7/26; patient presents for follow-up. She has [name] cast in place for the past week. She states it irritated her shin. Other than that she tolerated the cast well. She states she would like [name] break for 1 week from the cast. We have been using Keystone antibiotic and Aquacel to both wound beds. She denies signs of infection. 8/2; patient presents for follow-up. She has been using Keystone and Aquacel to the wound beds. She denies any issues and has no complaints. She is agreeable to have the cast placed today for the left leg. 06-03-2022 upon evaluation today patient appears to be doing well with regard to her wound she saw some signs of improvement which is great news. Fortunately I do not see any evidence of active infection locally or systemically at this time which is great news. No fevers, chills, nausea, vomiting, or diarrhea. 8/16; patient presents for follow-up. She has no issues or complaints today. We have been using Keystone and Aquacel to the wound beds. The left lower extremity is in [name] total contact cast. She is tolerated this well. 8/23; patient presents for follow-up. She has had the total contact cast on the left leg for the past week. Unfortunately this has rubbed and broken down the skin to the medial foot. She currently denies signs of infection. She has been using Keystone antibiotic to the right plantar foot wound. 8/30; patient presents for follow-up. We have held off on the total contact cast for the left leg for the past week. Her wound on the left foot has improved and the previous surrounding breakdown of skin has epithelialized. She has been using Keystone  antibiotic to both wound beds. She has no issues or complaints today. She denies signs of infection. 9/6; patient presents for follow-up. She has ordered her's Keystone antibiotic and this is arriving this week. She has been doing Dakin's wet-to-dry dressings to the wound beds. She denies signs of infection. She is agreeable to the total contact cast today. 9/13; patient presents for follow-up. She states that the cast caused her  left leg shin to be sore. She would like to take [name] break from the cast this week. She has been using Keystone antibiotic to the right plantar foot wound. She denies signs of infection. 9/20; patient presents for follow-up. She has been using Keystone antibiotics to the wound beds with calcium alginate to the right foot wound and Hydrofera Blue to the left foot wound. She is agreeable to having the cast placed today. She has been approved for Apligraf and we will order this for next clinic visit. 9/27; patient presents for follow-up. We have been using Keystone antibiotic with Hydrofera Blue to the left foot wound under [name] total contact cast. T the right o foot wound she has been using Keystone antibiotic and calcium alginate. She declines [name] total contact cast today. Apligraf is available for placement and she would like to proceed with this. 07-28-2022 upon evaluation today patient appears to be doing well currently in regard to her wound. She is actually showing signs of significant improvement which is great news. Fortunately I do not see any evidence of active infection locally nor systemically at this time. She has been seeing Dr. Heber Laurel and to be honest has been doing very well with the cast. Subsequently she comes in today with [name] cast on and we did reapply that today as well. She did not really want to she try to talk me out of that but I explained that if she wanted to heal this is really the right way to go. Patient voiced understanding. In regard to her right foot  this is actually [name] lot better compared to the last time I saw her which is also great news. 10/11; patient presents for follow-up. Apligraf and the total contact cast was placed to the left leg at last clinic visit. She states that her right foot wound had burning pain to it with the placement of Apligraf to this area. She has been doing Rembert over this area. She denies signs of infection including increased warmth, erythema or purulent drainage. DEANNIE, PARREIRA [name] ([id number]) [id number] Page 17 of 20 11/1; 3-week follow-up. The patient fortunately did not have [name] total contact cast or an Apligraf and on the left foot. She has been using Keystone ABD pads and kerlix and her own running shoes She arrives in clinic today with thick callus and [name] very poor surface on the left foot on the right nonviable skin subcutaneous tissue and [name] deep probing hole. 11/15; patient missed her last clinic appointment. She states she has not been dressing the wound beds for the past 2 weeks. She states that at she had [name] new roommate but is now going back to live with her mother. Apparently its been [name] distracting 2 weeks. Patient currently denies signs of infection. 11/22; patient presents for follow-up. She states she has been using Keystone antibiotic and Dakin's wet-to-dry dressings to the wound beds. She is agreeable for cast placement today. We had ordered Apligraf however this has not been received by our facility. 11/29; Patient had [name] total contact cast placed at last clinic visit and she tolerated this well. We were using silver alginate under the cast. Patient's been using Keystone antibiotic with Aquacel to the right plantar foot wound. She has no issues or complaints today. Apligraf is available for placement today. Patient would like to proceed with this. 12/6; patient presents for follow-up. She had Apligraf placed in standard fashion last clinic visit under the total contact cast  to the left lower  extremity. She has been using Keystone antibiotic and Aquacel to the right plantar foot wound. She has no issues or complaints today. 12/13; patient presents for follow-up. She has finished 5 Apligraf placements. Was told she would not qualify for more. We have been doing [name] total contact cast to the left lower extremity. She has been using Keystone antibiotic and Aquacel to the right plantar foot wound. She has no issues or complaints today. 12/20; patient presents for follow-up. We have been using Hydrofera Blue with Keystone antibiotic under [name] total contact cast of the left lower extremity. She reports using Keystone antibiotic and silver alginate to the right heel wound. She has no issues or complaints today. 12/27; patient presents with [name] healthy wound on the left midfoot. We have Apligraf to apply that to that more also using [name] total contact cast. On the right we are using Keystone and silver alginate. She is offloading the right heel with [name] surgical shoealthough by her admission she is on her feet quite [name] bit 1/3; patient presents for follow-up. Apligraf was placed to the wound beds last clinic visit. She was placed in [name] total contact cast to the left lower extremity. She declines [name] total contact cast today. She states that her mother is in the hospital and she cannot adequately get around with the cast on. 1/10; patient presents for follow-up. She declined the total contact cast at last clinic visit. Both wounds have declined in appearance. She states that she has been on her feet and not offloading the wound beds. She currently denies signs of infection. 1/17; patient presents for follow-up. She had the total contact cast along with Apligraf placed last week to the left lower extremity. She tolerated this well. She has been using Aquacel Ag and Keystone antibiotic to the right heel wound. She currently denies signs of infection. She has no issues or complaints today. 1/24;  patient presents for follow-up. We have been using Apligraf to the left foot wound along with [name] total contact cast. She has done well with this. T the right o heel wound she has been using Aquacel Ag and Keystone antibiotic ointment. She has no issues or complaints today. She denies signs of infection. 1/31; patient presents for follow-up. We have been using Apligraf to the left foot wound along with the total contact cast. She continues to do well with this. To the right heel we have been using Aquacel Ag and Keystone antibiotic ointment. She has no issues or complaints today. 12-01-2022 upon evaluation patient is seen today on my schedule due to the fact that she unfortunately was in the hospital yesterday. Her cast needed to come off the only reason she is out of the hospital is due to the fact that they would not take it off at the hospital which is somewhat bewildered reading to me to be perfectly honest. I am not certain why this was but either way she was released and then was placed on my schedule today in order to get this off and reapply the total contact casting as appropriate. I do not have an Apligraf for her today it was applied last week and today's actually expired yesterday as there was some scheduling conflicts with her being in the hospital. Nonetheless we do not have that for reapplication today but the good news that she is not draining too much and the Apligraf can go for up to 2 weeks so I am going to go ahead and reapply the total  contact casting but we are going to leave the Apligraf in place. Objective Constitutional Well-nourished and well-hydrated in no acute distress. Vitals Time Taken: 12:49 PM, Height: 69 in, Weight: 178 lbs, BMI: 26.3, Temperature: 98.2 F, Pulse: 64 bpm, Respiratory Rate: 16 breaths/min, Blood Pressure: 145/86 mmHg. Respiratory normal breathing without difficulty. Psychiatric this patient is able to make decisions and demonstrates good insight into  disease process. Alert and Oriented x 3. pleasant and cooperative. General Notes: Upon inspection patient's wound bed actually showed signs of doing quite well in regard to the right foot the left foot the Apligraf was still in place so this was not actually visualized today. I did reapply the total contact cast however to the left foot on the right foot we did perform debridement to clearway some of the necrotic debris and slough down to good subcutaneous tissue patient tolerated this without complication. Integumentary (Hair, Skin) Wound #11 status is Open. Original cause of wound was Surgical Injury. The date acquired was: 12/01/2021. The wound has been in treatment 37 weeks. The wound is located on the [place]. The wound measures 1cm length x 1cm width x 0.7cm depth; 0.785cm^2 area and 0.55cm^3 volume. There is Fat Layer (Subcutaneous Tissue) exposed. There is [name] medium amount of serosanguineous drainage noted. The wound margin is distinct with the outline attached to the wound base. There is medium (34-66%) pale granulation within the wound bed. There is [name] medium (34-66%) amount of necrotic tissue within the wound bed. Wound #12 status is Open. Original cause of wound was Pressure Injury. The date acquired was: 03/16/2020. The wound has been in treatment 37 weeks. The wound is located on the [place]. The wound measures 1.5cm length x 0.7cm width x 0.1cm depth; 0.825cm^2 area and 0.082cm^3 volume. There is Fat Layer (Subcutaneous Tissue) exposed. There is [name] medium amount of serous drainage noted. The wound margin is flat and intact. There is small (1- 33%) red, pink granulation within the wound bed. There is [name] large (67-100%) amount of necrotic tissue within the wound bed including Adherent Slough. General Notes: Apligraf remained in place. 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 There was [name] Excisional Skin/Subcutaneous Tissue Debridement with [name] total area of 1 sq cm performed by Tommie Sams., PA-C. With the following instrument(s): Curette to remove Viable and Non-Viable tissue/material. Material removed includes Subcutaneous Tissue, Slough, and Biofilm after achieving pain control using Lidocaine. No specimens were taken. [name] time out was conducted at 12:58, prior to the start of the procedure. [name] Minimum amount of bleeding was controlled with Pressure. The procedure was tolerated well. Post Debridement Measurements: 1cm length x 1cm width x 0.7cm depth; 0.55cm^3 volume. Character of Wound/Ulcer Post Debridement is stable. Post procedure Diagnosis Wound #11: Same as Pre-Procedure Wound #12 Pre-procedure diagnosis of Wound #12 is [name] Diabetic Wound/Ulcer of the Lower Extremity located on the Left,Medial,Plantar Foot . There was [name] T Contact otal Cast Procedure by Tommie Sams., PA-C. Post procedure Diagnosis Wound #12: Same as Pre-Procedure Plan Follow-up  Appointments: Return Appointment in 1 week. Nurse Visit as needed Bathing/ Shower/ Hygiene: May shower with wound dressing protected with water repellent cover or cast protector. No tub bath. Anesthetic (Use 'Patient Medications' Section for Anesthetic Order Entry): Lidocaine applied to wound bed Cellular or Tissue Based Products: Cellular or Tissue Based Product Type: - Apligraf applied remained in place from last application Cellular or Tissue Based Product applied  to wound bed; including contact layer, fixation with steri-strips, dry gauze and cover dressing. (DO NOT REMOVE). Edema Control - Lymphedema / Segmental Compressive Device / Other: Elevate, Exercise Daily and Avoid Standing for Long Periods of Time. Elevate legs to the level of the heart and pump ankles as often as possible Elevate leg(s) parallel to the floor when sitting. Off-Loading: T Contact Cast to Left Lower Extremity otal Open toe surgical shoe - Heel off loader - Right foot Other: - keep pressure off of feet. Additional Orders / Instructions: Follow Nutritious Diet and Increase Protein Intake Other: - Stay off of foot, wheelchair only Medications-Please add to medication list.: Wound #11 Right,Plantar Foot: Keystone Compound Consults ordered were: [place] for custom shoes Services and Therapies ordered were: DME provider, dressing supplies - Apligraf for next visit WOUND #11: - Foot Wound Laterality: Plantar, Right Cleanser: Wound Cleanser 1 x Per Day/30 Days Discharge Instructions: Wash your hands with soap and water. Remove old dressing, discard into plastic bag and place into trash. Cleanse the wound with Wound Cleanser prior to applying [name] clean dressing using gauze sponges, not tissues or cotton balls. Do not scrub or use excessive force. Pat dry using gauze sponges, not tissue or cotton balls. Topical: Gentamicin 1 x Per Day/30 Days Discharge Instructions: Apply as directed by provider. in clinic Topical: keystone gel 1 x Per Day/30 Days Discharge Instructions: at home Prim Dressing: Aquacel Extra Hydrofiber Dressing, 2x2 (in/in) 1 x Per Day/30 Days ary Secondary Dressing: ABD Pad 5x9 (in/in) (Generic) 1 x Per Day/30 Days Discharge Instructions: Cover with ABD pad Secured With: Medipore T - 64M Medipore H Soft Cloth Surgical T ape ape, 2x2 (in/yd) 1 x Per Day/30 Days Secured With: [employer] Sterile or Non-Sterile 6-ply 4.5x4 (yd/yd) 1 x Per  Day/30 Days Discharge Instructions: Apply Kerlix as directed WOUND #12: - Foot Wound Laterality: Plantar, Left, Medial Cleanser: Wound Cleanser 1 x Per Day/30 Days SWEDEN, HEINSOHN [name] ([id number]) [phone].pdf Page 19 of 20 Discharge Instructions: Wash your hands with soap and water. Remove old dressing, discard into plastic bag and place into trash. Cleanse the wound with Wound Cleanser prior to applying [name] clean dressing using gauze sponges, not tissues or cotton balls. Do not scrub or use excessive force. Pat dry using gauze sponges, not tissue or cotton balls. Topical: Gentamicin 1 x Per Day/30 Days Discharge Instructions: Apply as directed by provider. in clinic Topical: keystone gel 1 x Per Day/30 Days Discharge Instructions: at home Prim Dressing: Aquacel Extra Hydrofiber Dressing, 2x2 (in/in) 1 x Per Day/30 Days ary Secondary Dressing: ABD Pad 5x9 (in/in) (Generic) 1 x Per Day/30 Days Discharge Instructions: Cover with ABD pad Secured With: Medipore T - 64M Medipore H Soft Cloth Surgical T ape ape, 2x2 (in/yd) 1 x Per Day/30 Days Secured With: [employer] Sterile or Non-Sterile 6-ply 4.5x4 (yd/yd) 1 x Per Day/30 Days Discharge Instructions: Apply Kerlix as directed 1. I am good recommend that we have the patient continue to utilize [name] total contact cast for the left leg and she will be following  up with Dr. Heber Collbran next week and overall she seems to be doing really well in that regard. 2. I am good recommend as well we just stick with the silver alginate for the right heel. I did perform debridement clearway some of the slough and necrotic debris she tolerated that without complication were also using Keystone at home although here in the clinic she forgot to bring that therefore I did utilize gentamicin ointment here in the clinic. We will see patient back for reevaluation in 1 week here in the clinic. If anything worsens or changes patient will contact our  office for additional recommendations. Electronic Signature(s) Signed: 12/01/2022 6:05:29 PM By: Worthy Keeler PA-C Entered By: Worthy Keeler on 12/01/2022 18:05:28 -------------------------------------------------------------------------------- Total Contact Cast Details Patient Name: Date of Service: SANDYE, TARPEY 12/01/2022 12:30 PM Medical Record Number: [id number] Patient Account Number: [id number] Date of Birth/Sex: Treating RN: 1976-10-20 (47 y.o. [name] Primary Care Provider: Raelene Bott Other Clinician: Referring Provider: Treating Provider/Extender: Mariana Single in Treatment: [phone] T Contact Cast Applied for Wound Assessment: otal Wound #12 Left,Medial,Plantar Foot Performed By: Physician Tommie Sams., PA-C Post Procedure Diagnosis Same as Pre-procedure Electronic Signature(s) Signed: 12/01/2022 6:29:11 PM By: Worthy Keeler PA-C Signed: 12/02/2022 1:49:05 PM By: Gretta Cool, BSN, RN, CWS, [name] RN, BSN Entered By: Gretta Cool, BSN, RN, CWS, [name] on 12/01/2022 13:28:19 [name] ([id number].pdf Page 20 of 20 -------------------------------------------------------------------------------- SuperBill Details Patient Name: Date of Service: KONNIE, KNEEDLER 12/01/2022 Medical Record Number: [id number] Patient Account Number: [id number] Date of Birth/Sex: Treating RN: [birth year] (47 y.o. [name], [name] Primary Care Provider: Raelene Bott Other Clinician: Referring Provider: Treating Provider/Extender: Lockie Pares Weeks in Treatment: 37 Diagnosis Coding ICD-10 Codes Code Description [phone] Non-pressure chronic ulcer of other part of left foot with other specified severity 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.621 Type 2 diabetes mellitus with foot ulcer E11.42 Type 2 diabetes mellitus with diabetic polyneuropathy L97.811 Non-pressure chronic  ulcer of other part of right lower leg limited to breakdown of skin Facility Procedures : CPT4 Code: [id number] Description: [place] - DEB SUBQ TISSUE 20 SQ CM/< ICD-10 Diagnosis Description L97.512 Non-pressure chronic ulcer of other part of right foot with fat layer exposed Modifier: Quantity: 1 : CPT4 Code: [id number] Description: [id number] - APPLY TOTAL CONTACT LEG CAST ICD-10 Diagnosis Description 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 E11.621 Type 2 diabetes mellitus with foot ulcer Modifier: ity Quantity: 1 Physician Procedures : CPT4 Code Description Modifier [id number] - WC PHYS SUBQ TISS 20 SQ CM ICD-10 Diagnosis Description L97.512 Non-pressure chronic ulcer of other part of right foot with fat layer exposed Quantity: 1 : [id number] - WC PHYS APPLY TOTAL CONTACT CAST ICD-10 Diagnosis Description 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ity E11.621 Type 2 diabetes mellitus with foot ulcer Quantity: 1 Electronic Signature(s) Signed: 12/01/2022 6:06:02 PM By: Worthy Keeler PA-C Entered By: Worthy Keeler on 12/01/2022 18:06:01

## 2022-12-02 NOTE — Progress Notes (Signed)
[place], JUDAH [name] ([id number]) [id number] Page 1 of 9 Visit Report for 12/01/2022 Arrival Information Details Patient Name: Date of Service: [name] 12/01/2022 12:30 PM Medical Record Number: [id number] Patient Account Number: [id number] Date of Birth/Sex: Treating RN: 05/27/1976 (47 y.o. [name] Primary Care Johnatan Baskette: Raelene Bott Other Clinician: Referring Jeania Nater: Treating Jove Beyl/Extender: Mariana Single in Treatment: 53 Visit Information History Since Last Visit Added or deleted any medications: Yes Patient Arrived: Wheel Chair Has Dressing in Place as Prescribed: Yes Arrival Time: 12:48 Has Footwear/Offloading in Place as Prescribed: Yes Accompanied By: mom Left: T Contact Cast otal Transfer Assistance: None Pain Present Now: No Patient Identification Verified: Yes Secondary Verification Process Completed: Yes Patient Requires Transmission-Based Precautions: No Patient Has Alerts: No Electronic Signature(s) Signed: 12/02/2022 1:49:05 PM By: Gretta Cool, BSN, RN, CWS, [name] RN, BSN Entered By: Gretta Cool, BSN, RN, CWS, [name] on 12/01/2022 12:49:32 -------------------------------------------------------------------------------- Encounter Discharge Information Details Patient Name: Date of Service: [name] 12/01/2022 12:30 PM Medical Record Number: [id number] Patient Account Number: [id number] Date of Birth/Sex: Treating RN: Aug 16, 1976 (47 y.o. [name] Primary Care Zoriah Pulice: Raelene Bott Other Clinician: Referring Tanazia Achee: Treating Taviana Westergren/Extender: Mariana Single in Treatment: 37 Encounter Discharge Information Items Post Procedure Vitals Discharge Condition: Stable Temperature (F): 98.2 Ambulatory Status: Ambulatory Pulse (bpm): 64 Discharge Destination: Home Respiratory Rate (breaths/min): 18 Transportation: Private Auto Blood Pressure (mmHg): 145/86 Accompanied By: mom Schedule  Follow-up Appointment: Yes Clinical Summary of Care: Electronic Signature(s) Signed: 12/02/2022 1:49:05 PM By: Gretta Cool, BSN, RN, CWS, [name] RN, BSN [name], [name] ([id number]) [phone].pdf Page 2 of 9 Entered By: Gretta Cool, BSN, RN, CWS, [name] on 12/01/2022 13:33:57 -------------------------------------------------------------------------------- Lower Extremity Assessment Details Patient Name: Date of Service: [name], [name] 12/01/2022 12:30 PM Medical Record Number: [id number] Patient Account Number: [id number] Date of Birth/Sex: Treating RN: 1976/01/29 (47 y.o. [name] Primary Care Jezebelle Ledwell: Raelene Bott Other Clinician: Referring Buckley Bradly: Treating Lirio Bach/Extender: Lockie Pares Weeks in Treatment: 37 Edema Assessment Assessed: [name]: Yes] [name]: Yes] Edema: [Left: No] [Right: No] Vascular Assessment Pulses: Dorsalis Pedis Palpable: [Left:Yes] [Right:Yes] Electronic Signature(s) Signed: 12/02/2022 1:49:05 PM By: Gretta Cool, BSN, RN, CWS, [name] RN, BSN Entered By: Gretta Cool, BSN, RN, CWS, [name] on 12/01/2022 12:58:52 -------------------------------------------------------------------------------- Multi Wound Chart Details Patient Name: Date of Service: [name] 12/01/2022 12:30 PM Medical Record Number: [id number] Patient Account Number: [id number] Date of Birth/Sex: Treating RN: 1976-02-25 (47 y.o. 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: Extremity Chronic sinus problems/congestion, Chronic sinus problems/congestion, N/[name] Comorbid History: Middle ear problems, Anemia, Chronic Middle ear problems, Anemia, Chronic Obstructive Pulmonary Disease Obstructive Pulmonary Disease (COPD), Congestive Heart Failure, (COPD), Congestive Heart Failure, Type II Diabetes, End Stage Renal Type II Diabetes, End Stage Renal Disease, History of pressure wounds, Disease, History of pressure wounds, Neuropathy Neuropathy 12/01/2021 03/16/2020 N/[name] Date Acquired: 21 21 N/[name] Weeks of Treatment: Open Open N/[name] Wound Status: No No N/[name] Wound Recurrence: 1x1x0.7 1.5x0.7x0.1 N/[name] Measurements L x W x D (cm) 0.785 0.825 N/[name] [name] (cm) : rea 0.55 0.082 N/[name] Volume (cm) :  57.50% 82.50% N/[name] % Reduction in Area: 66.90% 91.30% N/[name] % Reduction in Volume: Full Thickness Without Exposed Grade 3 N/[name] Classification: Support Structures Medium Medium N/[name] Exudate Amount: Serosanguineous Serous N/[name] Exudate Type: red, brown amber N/[name] Exudate Color: Distinct, outline attached Flat and Intact N/[name] Wound Margin: Medium (34-66%) Small (1-33%) N/[name] Granulation Amount: Pale Red, Pink N/[name] Granulation Quality: Medium (34-66%) Large (67-100%) N/[name] Necrotic Amount: Fat Layer (Subcutaneous Tissue): Yes Fat Layer (Subcutaneous Tissue): Yes N/[name] Exposed Structures: Fascia: No Fascia: No Tendon: No Tendon: No Muscle: No Muscle: No Joint: No Joint: No Bone: No Bone: No Medium (34-66%) None N/[name] Epithelialization: N/[name] Apligraf remained in place. N/[name] Assessment Notes: Treatment Notes Electronic Signature(s) Signed: 12/02/2022 1:49:05 PM By: Gretta Cool, BSN, RN, CWS, [name] RN, BSN Entered By: Gretta Cool, BSN, RN, CWS, [name] on 12/01/2022 12:58:57 -------------------------------------------------------------------------------- Multi-Disciplinary Care Plan Details Patient Name: Date of Service: [name], [name]  12/01/2022 12:30 PM Medical Record Number: [id number] Patient Account Number: [id number] Date of Birth/Sex: Treating RN: [birth year] (47 y.o. [name] Primary Care Terryn Rosenkranz: Raelene Bott Other Clinician: Referring Khaled Herda: Treating Dixon Luczak/Extender: Mariana Single in Treatment: [id number] Active Inactive Abuse / Safety / Falls / Self Care Management JISELLA, HERDA [name] ([id number]) [id number] Page 4 of 9 Nursing Diagnoses: History of Falls Potential for falls Potential for injury related to falls Goals: Patient will not develop complications from immobility Date Initiated: 03/17/2022 Date Inactivated: 04/20/2022 Target Resolution Date: 03/16/2022 Goal Status: Met Patient/caregiver will verbalize understanding of skin care regimen Date Initiated: 03/17/2022 Date Inactivated: 09/28/2022 Target Resolution Date: 03/16/2022 Goal Status: Met Patient/caregiver will verbalize/demonstrate measure taken to improve self care Date Initiated: 03/17/2022 Target Resolution Date: 03/16/2022 Goal Status: Active Interventions: Assess fall risk on admission and as needed Provide education on basic hygiene Provide education on personal and home safety Notes: Necrotic Tissue Nursing Diagnoses: Impaired tissue integrity related to necrotic/devitalized tissue Knowledge deficit related to management of necrotic/devitalized tissue Goals: Necrotic/devitalized tissue will be minimized in the wound bed Date Initiated: 03/17/2022 Target Resolution Date: 10/26/2022 Goal Status: Active Patient/caregiver will verbalize understanding of reason and process for debridement of necrotic tissue Date Initiated: 03/17/2022 Date Inactivated: 09/28/2022 Target Resolution Date: 03/16/2022 Goal Status: Met Interventions: Provide education on necrotic tissue and debridement process Treatment Activities: Apply topical anesthetic as ordered : 03/16/2022 Excisional debridement :  03/16/2022 Notes: Osteomyelitis Nursing Diagnoses: Infection: osteomyelitis Knowledge deficit related to disease process and management Goals: Patient/caregiver will verbalize understanding of disease process and disease management Date Initiated: 03/17/2022 Target Resolution Date: 03/16/2022 Goal Status: Active Patient's osteomyelitis will resolve Date Initiated: 03/17/2022 Target Resolution Date: 10/26/2022 Goal Status: Active Signs and symptoms for osteomyelitis will be recognized and promptly addressed Date Initiated: 03/17/2022 Target Resolution Date: 10/26/2022 Goal Status: Active Interventions: Assess for signs and symptoms of osteomyelitis resolution every visit Provide education on osteomyelitis Treatment Activities: Systemic antibiotics : 03/16/2022 Notes: Patient on antibiotics. Pressure Nursing Diagnoses: Knowledge deficit related to management of pressures ulcers [place], Azaliyah [name] ([id number]) [phone].pdf Page 5 of 9 Potential for impaired tissue integrity related to pressure, friction, moisture, and shear Goals: Patient will remain free from development of additional pressure ulcers Date Initiated: 11/02/2022 Target Resolution Date: 11/09/2022 Goal Status: Active Patient/caregiver will verbalize risk factors for pressure ulcer development Date Initiated: 11/02/2022 Target Resolution Date: 11/02/2022 Goal Status: Active Patient/caregiver will verbalize understanding of pressure ulcer management Date Initiated: 11/02/2022 Target Resolution Date: 11/02/2022 Goal Status: Active Interventions: Assess: immobility, friction, shearing, incontinence upon admission and as needed Provide education  on pressure ulcers Notes: Electronic Signature(s) Signed: 12/02/2022 1:49:05 PM By: Gretta Cool, BSN, RN, CWS, [name] RN, BSN Entered By: Gretta Cool, BSN, RN, CWS, [name] on 12/01/2022 13:32:57 -------------------------------------------------------------------------------- Pain  Assessment Details Patient Name: Date of Service: [name], [name] 12/01/2022 12:30 PM Medical Record Number: [id number] Patient Account Number: [id number] Date of Birth/Sex: Treating RN: 1976/02/22 (47 y.o. [name] Primary Care Tymeshia Awan: Raelene Bott Other Clinician: Referring Jesus Nevills: Treating Abbigale Mcelhaney/Extender: Mariana Single in Treatment: 37 Active Problems Location of Pain Severity and Description of Pain Patient Has Paino No Site Locations Pain Management and Medication Current Pain Management: Electronic Signature(s) Signed: 12/02/2022 1:49:05 PM By: Gretta Cool, BSN, RN, CWS, [name] RN, BSN [place], Grant [name] ([id number]) PM By: Gretta Cool, BSN, RN, CWS, [name] RN, BSN [id number].pdf Page 6 of 9 Signed: 12/02/2022 1:49:05 Entered By: Gretta Cool, BSN, RN, CWS, [name] on 12/01/2022 12:50:01 -------------------------------------------------------------------------------- Patient/Caregiver Education Details Patient Name: Date of Service: [name] 2/8/2024andnbsp12:30 PM Medical Record Number: [id number] Patient Account Number: [id number] Date of Birth/Gender: Treating RN: [birth year] (47 y.o. [name] Primary Care Physician: Raelene Bott Other Clinician: Referring Physician: Treating Physician/Extender: Mariana Single in Treatment: 37 Education Assessment Education Provided To: Patient Education Topics Provided Offloading: Handouts: How [place], Other: DO not get wet. Methods: Explain/Verbal Responses: State content correctly Electronic Signature(s) Signed: 12/02/2022 1:49:05 PM By: Gretta Cool, BSN, RN, CWS, [name] RN, BSN Entered By: Gretta Cool, BSN, RN, CWS, [name] on 12/01/2022 13:32:51 -------------------------------------------------------------------------------- Wound Assessment Details Patient Name: Date of Service: [name], [name] 12/01/2022 12:30 PM Medical Record Number: [id number] Patient  Account Number: [id number] Date of Birth/Sex: Treating RN: 03-16-1976 (47 y.o. [name] Primary Care Venida Tsukamoto: Raelene Bott Other Clinician: Referring Javien Tesch: Treating Fawnda Vitullo/Extender: Lockie Pares Weeks in Treatment: 37 Wound Status Wound Number: 11 Primary Open Surgical Wound Etiology: Wound Location: Right, Plantar Foot Wound Open Wounding Event: Surgical Injury Status: Date Acquired: 12/01/2021 Comorbid Chronic sinus problems/congestion, Middle ear problems, Anemia, Weeks Of Treatment: 37 History: Chronic Obstructive Pulmonary Disease (COPD), Congestive Heart Clustered Wound: No Failure, Type II Diabetes, End Stage Renal Disease, History of pressure wounds, Neuropathy [name], [name] ([id number] Page 7 of 9 Photos Wound Measurements Length: (cm) 1 Width: (cm) 1 Depth: (cm) 0.7 Area: (cm) 0.785 Volume: (cm) 0.55 % Reduction in Area: 57.5% % Reduction in Volume: 66.9% Epithelialization: Medium (34-66%) Wound Description Classification: Full Thickness Without Exposed Suppo Wound Margin: Distinct, outline attached Exudate Amount: Medium Exudate Type: Serosanguineous Exudate Color: red, brown rt Structures Foul Odor After Cleansing: No Slough/Fibrino Yes Wound Bed Granulation Amount: Medium (34-66%) Exposed Structure Granulation Quality: Pale Fascia Exposed: No Necrotic Amount: Medium (34-66%) Fat Layer (Subcutaneous Tissue) Exposed: Yes Tendon Exposed: No Muscle Exposed: No Joint Exposed: No Bone Exposed: No Treatment Notes Wound #11 (Foot) Wound Laterality: Plantar, Right Cleanser Wound Cleanser Discharge Instruction: Wash your hands with soap and water. Remove old dressing, discard into plastic bag and place into trash. Cleanse the wound with Wound Cleanser prior to applying [name] clean dressing using gauze sponges, not tissues or cotton balls. Do not scrub or use excessive force. Pat dry using gauze  sponges, not tissue or cotton balls. Peri-Wound Care Topical Gentamicin Discharge Instruction: Apply as directed by Adil Tugwell. in clinic keystone gel Discharge Instruction: at home Primary Dressing Aquacel Extra Hydrofiber Dressing, 2x2 (in/in) Secondary Dressing ABD Pad 5x9 (in/in) Discharge Instruction: Cover with ABD pad Secured With [place] H Soft Cloth Surgical  T ape ape, 2x2 (in/yd) Kerlix Roll Sterile or Non-Sterile 6-ply 4.5x4 (yd/yd) Discharge Instruction: Apply Kerlix as directed Compression Wrap Compression Stockings Add-Ons Electronic Signature(s) Signed: 12/02/2022 1:49:05 PM By: Gretta Cool, BSN, RN, CWS, [name] RN, BSN [name], [name] ([id number]) PM By: Gretta Cool, BSN, RN, CWS, [name] RN, BSN [phone].pdf Page 8 of 9 Signed: 12/02/2022 1:49:05 Entered By: Gretta Cool, BSN, RN, CWS, [name] on 12/01/2022 12:57:25 -------------------------------------------------------------------------------- Wound Assessment Details Patient Name: Date of Service: [name], [name] 12/01/2022 12:30 PM Medical Record Number: [id number] Patient Account Number: [id number] Date of Birth/Sex: Treating RN: 03/06/1976 (47 y.o. [name], [name] Primary Care Shayden Bobier: Raelene Bott Other Clinician: Referring Genola Yuille: Treating Madiline Saffran/Extender: Lockie Pares Weeks in Treatment: 37 Wound Status Wound Number: 12 Primary Diabetic Wound/Ulcer of the Lower Extremity Etiology: Wound Location: Left, Medial, Plantar Foot Wound Open Wounding Event: Pressure Injury Status: Date Acquired: 03/16/2020 Comorbid Chronic sinus problems/congestion, Middle ear problems, Anemia, Weeks Of Treatment: 37 History: Chronic Obstructive Pulmonary Disease (COPD), Congestive Heart Clustered Wound: No Failure, Type II Diabetes, End Stage Renal Disease, History of pressure wounds, Neuropathy Photos Wound Measurements Length: (cm) 1.5 Width: (cm) 0.7 Depth: (cm) 0.1 Area: (cm)  0.825 Volume: (cm) 0.082 % Reduction in Area: 82.5% % Reduction in Volume: 91.3% Epithelialization: None Wound Description Classification: Grade 3 Wound Margin: Flat and Intact Exudate Amount: Medium Exudate Type: Serous Exudate Color: amber Foul Odor After Cleansing: No Slough/Fibrino No Wound Bed Granulation Amount: Small (1-33%) Exposed Structure Granulation Quality: Red, Pink Fascia Exposed: No Necrotic Amount: Large (67-100%) Fat Layer (Subcutaneous Tissue) Exposed: Yes Necrotic Quality: Adherent Slough Tendon Exposed: No Muscle Exposed: No Joint Exposed: No Bone Exposed: No Assessment Notes Apligraf remained in place. [name], [name] ([id number]) [id number] Page 9 of 9 Treatment Notes Wound #12 (Foot) Wound Laterality: Plantar, Left, Medial Cleanser Wound Cleanser Discharge Instruction: Wash your hands with soap and water. Remove old dressing, discard into plastic bag and place into trash. Cleanse the wound with Wound Cleanser prior to applying [name] clean dressing using gauze sponges, not tissues or cotton balls. Do not scrub or use excessive force. Pat dry using gauze sponges, not tissue or cotton balls. Peri-Wound Care Topical Gentamicin Discharge Instruction: Apply as directed by [place] Knee. in clinic keystone gel Discharge Instruction: at home Primary Dressing Aquacel Extra Hydrofiber Dressing, 2x2 (in/in) Secondary Dressing ABD Pad 5x9 (in/in) Discharge Instruction: Cover with ABD pad Secured With [place] Soft Cloth Surgical T ape ape, 2x2 (in/yd) Kerlix Roll Sterile or Non-Sterile 6-ply 4.5x4 (yd/yd) Discharge Instruction: Apply Kerlix as directed Compression Wrap Compression Stockings Add-Ons Electronic Signature(s) Signed: 12/02/2022 1:49:05 PM By: Gretta Cool, BSN, RN, CWS, [name] RN, BSN Entered By: Gretta Cool, BSN, RN, CWS, [name] on 12/01/2022  12:58:21 -------------------------------------------------------------------------------- [name] Details Patient Name: Date of Service: [name] 12/01/2022 12:30 PM Medical Record Number: [id number] Patient Account Number: [id number] Date of Birth/Sex: Treating RN: 1976-03-02 (47 y.o. [name], [name] Primary Care Izak Anding: Raelene Bott Other Clinician: Referring Takima Encina: Treating Roiza Wiedel/Extender: Mariana Single in Treatment: 37 Vital Signs Time Taken: 12:49 Temperature (F): 98.2 Height (in): 69 Pulse (bpm): 64 Weight (lbs): 178 Respiratory Rate (breaths/min): 16 Body Mass Index (BMI): 26.3 Blood Pressure (mmHg): 145/86 Reference Range: 80 - 120 mg / dl Electronic Signature(s) Signed: 12/02/2022 1:49:05 PM By: Gretta Cool, BSN, RN, CWS, [name] RN, BSN Entered By: Gretta Cool, BSN, RN, CWS, [name] on 12/01/2022 12:49:54

## 2022-12-07 ENCOUNTER — Encounter (HOSPITAL_BASED_OUTPATIENT_CLINIC_OR_DEPARTMENT_OTHER): Payer: Medicaid Other | Admitting: Internal Medicine

## 2022-12-07 DIAGNOSIS — L97512 Non-pressure chronic ulcer of other part of right foot with fat layer exposed: Secondary | ICD-10-CM | POA: Diagnosis not present

## 2022-12-07 DIAGNOSIS — L97528 Non-pressure chronic ulcer of other part of left foot with other specified severity: Secondary | ICD-10-CM

## 2022-12-07 DIAGNOSIS — E11621 Type 2 diabetes mellitus with foot ulcer: Secondary | ICD-10-CM

## 2022-12-10 NOTE — Progress Notes (Signed)
[name] [name] ([id number]) [id number] Page 1 of 21 Visit Report for 12/07/2022 Chief Complaint Document Details Patient Name: Date of Service: [name], [name] 12/07/2022 11:00 [name]: [id number] Patient Account Number: [id number] Date of Birth/Sex: Treating RN: 01-17-1976 (47 y.o. [name] Primary Care Provider: Raelene Bott Other Clinician: Massie Kluver Referring Provider: Treating Provider/Extender: Eddie North in Treatment: 72 Information Obtained from: Patient Chief Complaint 03/19/2021; patient referred by Dr. Luana Shu who has been looking after her left foot for quite [name] period of time for review of [name] nonhealing area in the left midfoot 03/12/2022; bilateral feet wounds and right lower extremity wound. Electronic Signature(s) Signed: 12/07/2022 3:27:54 PM By: Kalman Shan DO Entered By: Kalman Shan on 12/07/2022 13:15:25 -------------------------------------------------------------------------------- Cellular or Tissue Based Product Details Patient Name: Date of Service: [name] 12/07/2022 11:00 [name]: [id number] Patient Account Number: [id number] Date of Birth/Sex: Treating RN: Sep 23, 1976 (47 y.o. [name] Primary Care Provider: Raelene Bott Other Clinician: Massie Kluver Referring Provider: Treating Provider/Extender: Eddie North in Treatment: 38 Cellular or Tissue Based Product Type Wound #12 Left,Medial,Plantar Foot Applied to: Performed By: Physician Kalman Shan, MD Cellular or Tissue Based Product Type: Apligraf Level of Consciousness (Pre-procedure): Awake and Alert Pre-procedure Verification/Time Out Yes - 11:49 Taken: Location: genitalia / hands / feet / multiple digits Wound Size (sq cm): 1.05 Product Size (sq cm): 44 Waste Size (sq cm): 11 Waste Reason: wound size Amount of Product Applied (sq cm):  33 Instrument Used: Blade, Scissors Lot #: [id number] Order #: [id number] Expiration Date: 12/15/2022 [name] ([id number]) [phone].pdf Page 2 of 21 Fenestrated: Yes Instrument: Blade Reconstituted: Yes Solution Type: normal saline Solution Amount: 9m Lot #: [id number] Expiration Date: 02/05/2024 Secured: Yes Secured With: Steri-Strips Dressing Applied: Yes Primary Dressing: Bolster Response to Treatment: Procedure was tolerated well Level of Consciousness (Post- Awake and Alert procedure): Post Procedure Diagnosis Same as Pre-procedure Electronic Signature(s) Signed: 12/08/2022 11:57:56 AM By: VMassie KluverEntered By: VMassie Kluveron 12/07/2022 11:52:31 -------------------------------------------------------------------------------- Debridement Details Patient Name: Date of Service: Heather HitchA. 12/07/2022 11:00 [name]: [id number] Account Number: [id number] of Birth/Sex: Treating RN: 9July 01, 1977(47y.o. FMarlowe ShoresPrimary Care Provider: HRaelene BottOther Clinician: VMassie KluverReferring Provider: Treating Provider/Extender: HEddie Northin Treatment: 38 Debridement Performed for Assessment: Wound #12 Left,Medial,Plantar Foot Performed By: Physician HKalman Shan MD Debridement Type: Debridement Severity of Tissue Pre Debridement: Fat layer exposed Level of Consciousness (Pre-procedure): Awake and Alert Pre-procedure Verification/Time Out Yes - 11:47 Taken: Start Time: 11:47 T Area Debrided (L x W): otal 1.5 (cm) x 0.7 (cm) = 1.05 (cm) Tissue and other material debrided: Callus, Slough, Subcutaneous, Slough Level: Skin/Subcutaneous Tissue Debridement Description: Excisional Instrument: Curette Bleeding: Minimum Hemostasis Achieved: Pressure Response to Treatment: Procedure was tolerated well Level of Consciousness (Post- Awake and  Alert procedure): Post Debridement Measurements of Total Wound Length: (cm) 1.5 Width: (cm) 0.7 Depth: (cm) 0.2 Volume: (cm) 0.165 Character of Wound/Ulcer Post Debridement: Stable Severity of Tissue Post Debridement: Fat layer exposed Post Procedure Diagnosis Same as Pre-procedure [name], [name] ([id number] Page 3 of 21 Electronic Signature(s) Signed: 12/07/2022 12:00:48 PM By: HKalman ShanDO Signed: 12/08/2022 11:57:56 AM By: VMassie KluverSigned: 12/09/2022 1:08:07 PM By: WGretta Cool BSN, RN, CWS, [name] RN, BSN Entered By: VMassie Kluveron 12/07/2022 11:47:46 -------------------------------------------------------------------------------- Debridement Details Patient Name: Date of Service: [name] [name]. 12/07/2022 11:00 [name]: [id number] Patient Account Number: [id number] Date of Birth/Sex: Treating RN: 1976-06-23 (47 y.o. [name] Primary Care Provider: Raelene Bott Other Clinician: Massie Kluver Referring Provider: Treating Provider/Extender: Eddie North in Treatment: 38 Debridement Performed for Assessment: Wound #11 Right,Plantar Foot Performed By: Physician Kalman Shan, MD Debridement Type: Debridement Level of Consciousness (Pre-procedure): Awake and Alert Pre-procedure Verification/Time Out Yes - 11:47 Taken: Start Time: 11:47 T Area Debrided (L x W): otal 0.8 (cm) x 0.6 (cm) = 0.48 (cm) Tissue and other material debrided: Callus, Slough, Subcutaneous, Slough Level: Skin/Subcutaneous Tissue Debridement Description: Excisional Instrument: Curette Bleeding: Minimum Hemostasis Achieved: Pressure Response to Treatment: Procedure was tolerated well Level of Consciousness (Post- Awake and Alert procedure): Post Debridement Measurements of Total Wound Length: (cm) 0.8 Width: (cm) 0.6 Depth: (cm) 0.5 Volume: (cm) 0.188 Character of Wound/Ulcer Post Debridement:  Stable Post Procedure Diagnosis Same as Pre-procedure Electronic Signature(s) Signed: 12/07/2022 12:00:48 PM By: Kalman Shan DO Signed: 12/08/2022 11:57:56 AM By: Massie Kluver Signed: 12/09/2022 1:08:07 PM By: Gretta Cool, BSN, RN, CWS, [name] RN, BSN Entered By: Massie Kluver on 12/07/2022 11:48:44 [name] ([id number]) [id number] Page 4 of 21 -------------------------------------------------------------------------------- HPI Details Patient Name: Date of Service: [name], [name] 12/07/2022 11:00 [name]: [id number] Patient Account Number: [id number] Date of Birth/Sex: Treating RN: 1976-09-03 (47 y.o. [name] Primary Care Provider: Raelene Bott Other Clinician: Massie Kluver Referring Provider: Treating Provider/Extender: Eddie North in Treatment: 43 History of Present Illness HPI Description: 01/18/18-She is here for initial evaluation of the left great toe ulcer. She is [name] poor historian in regards to timeframe in detail. She states approximately 4 weeks ago she lacerated her toe on something in the house. She followed up with her primary care who placed her on Bactrim and ultimately [name] second dose of Bactrim prior to coming to wound clinic. She states she has been treating the toe with peroxide, Betadine and [name] Band-Aid. She did not check her blood sugar this morning but checked it yesterday morning it was 327; she is unaware of [name] recent A1c and there are no current records. She saw Dr. she would've orthopedics last week for an old injury to the left ankle, she states he did not see her toe, nor did she bring it to his attention. She smokes approximately 1 pack cigarettes [name] day. Her social situation is concerning, she arrives this morning with her mother who appears extremely intoxicated/under the influence; her mother was asked to leave the room and be monitored by the patient's grandmother. The  patient's aunt then accompanied the patient and the room throughout the rest of the appointment. We had [name] lengthy discussion regarding the deleterious effects of uncontrolled hyperglycemia and smoking as it relates to wound healing and overall health. She was strongly encouraged to decrease her smoking and get her diabetes under better control. She states she is currently on [name] diet and has cut down her [facility] consumption. The left toe is erythematous, macerated and slightly edematous with malodor present. The edema in her left foot is below her baseline, there is no erythema streaking. We will treat her with Santyl, doxycycline; we have ordered and xray, culture and provided [name] Peg assist surgical shoe and cultured the wound. 01/25/18-She is here in follow-up evaluation for [name] left great toe ulcer and presents with an abscess to her suprapubic area. She states her blood sugars remain  elevated, feeling "sick" and if levels are below 250, but she is trying. She has made no attempt to decrease her smoking stating that we "can't take away her food in her cigarettes". She has been compliant with offloading using the PEG assist you. She is using Santyl daily. the culture obtained last week grew staph aureus and Enterococcus faecalis; continues on the doxycycline and Augmentin was added on Monday. The suprapubic area has erythema, no femoral variation, purple discoloration, minimal induration, was accessed with [name] cotton tip applicator with sanguinopurulent drainage, this was cultured, I suspect the current antibiotic treatment will cover and we will not add anything to her current treatment plan. She was advised to go to urgent care or ER with any change in redness, induration or fever. 02/01/18-She is here in follow-up evaluation for left great toe ulcers and [name] new abdominal abscess from last week. She was able to use packing until earlier this week, where she "forgot it was there". She states she was  feeling ill with GI symptoms last week and was not taking her antibiotic. She states her glucose levels have been predominantly less than 200, with occasional levels between 200-250. She thinks this was contributing to her GI symptoms as they have resolved without intervention. There continues to be significant laceration to left toe, otherwise it clinically looks stable/improved. There is now less superficial opening to the lateral aspect of the great toe that was residual blister. We will transition to [facility] to all wounds, she will continue her Augmentin. If there is no change or deterioration next week for reculture. 02/08/18-She is here in follow-up evaluation for left great toe ulcer and abdominal ulcer. There is an improvement in both wounds. She has been wrapping her left toe with coban, not by our direction, which has created an area of discoloration to the medial aspect; she has been advised to NOT use coban secondary to her neuropathy. She states her glucose levels have been high over this last week ranging from 200-350, she continues to smoke. She admits to being less compliant with her offloading shoe. We will continue with same treatment plan and she will follow-up next week. 02/15/18-She is here in follow-up evaluation for left great toe ulcer and abdominal ulcer. The abdominal ulcer is epithelialized. The left great toe ulcer is improved and all injury from last week using the Coban wrap is resolved, the lateral ulcer is healed. She admits to noncompliance with wearing offloading shoe and admits to glucose levels being greater than 300 most of the week. She continues to smoke and expresses no desire to quit. There is one area medially that probes deeper than it has historically, erythema to the toe and dorsal foot has consistently waxed and waned. There is no overt signs of cellulitis or infection but we will culture the wound for any occult infection given the new area of depth  and erythema. We will hold off on sensitivities for initiation of antibiotic therapy. 02/22/18-She is here in follow up evaluation for left great toe ulcer. There is overall significant improvement in both wound appearance, erythema and edema with changes made last week. She was not initiated on antibiotic therapy. Culture obtained last week showed oxacillin sensitive staph aureus, sensitive to clindamycin. Clindamycin has been called into the pharmacy but she has been instructed to hold off on initiation secondary to overall clinical improvement and her history of antibiotic intolerance. She has been instructed to contact the clinic with any noted changes/deterioration and the wound,  erythema, edema and/or pain. She will follow-up next week. She continues to smoke and her glucose levels remain elevated >250; she admits to compliance with offloading shoe 03/01/18 on evaluation today patient appears to be doing fairly well in regard to her left first toe ulcer. She has been tolerating the dressing changes with the [facility] Dressing without complication and overall this has definitely showed signs of improvement according to records as well is what the patient tells me today. I'm very pleased in that regard. She is having no pain today 03/08/18 She is here for follow up evaluation of [name] left great toe ulcer. She remains non-compliant with glucose control and smoking cessation; glucose levels consistently >200. She states that she got new shoe inserts/peg assist. She admits to compliance with offloading. Since my last evaluation there is significant improvement. We will switch to prisma at this time and she will follow up next week. She is noted to be tachycardic at this appointment, heart rate 120s; she has [name] history of heart rate 70-130 according to our records. She admits to extreme agitation r/t personal issues; she was advised to monitor her heartrate and contact her physician if it does not return  to [name] more normal range (<100). She takes cardizem twice daily. 03/15/18-She is here in follow-up evaluation for left great toe ulcer. She remains noncompliant with glucose control and smoking cessation. She admits to compliance with wearing offloading shoe. The ulcer is improved/stable and we will continue with the same treatment plan and she will follow-up next week 03/22/18-She is here for evaluation for left great toe ulcer. There continues to be significant improvement despite recurrent hyperglycemia (over 500 yesterday) and she continues to smoke. She has been compliant with offloading and we will continue with same treatment plan and she will follow-up next week. 03/29/18-She is here for evaluation for left great toe ulcer. Despite continuing to smoke and uncontrolled diabetes she continues to improve. She is compliant with offloading shoe. We will continue with the same treatment plan and she will follow-up next week 04/05/18- She is here in follow up evaluation for [name] left great toe ulcer; she presents with small pustule to left fifth toe (resembles ant bite). She admits to compliance with wearing offloading shoe; continues to smoke or have uncontrolled blood glucose control. There is more callus than usual with evidence of bleeding; she denies known trauma. 04/12/18-She is here for evaluation of left great toe ulcer. Despite noncompliance with glycemic control and smoking she continues to make improvement. She continues to wear offloading shoe. The pustule, that was identified last week, to the left fifth toe is resolved. She will follow-up in 2 weeks 05/03/18-she is seen in follow-up evaluation for [name] left great toe ulcer. She is compliant with offloading, otherwise noncompliant with glycemic control and smoking. She has plateaued and there is minimal improvement noted. We will transition to [facility], replaced the insert to her surgical shoe and she will follow-up in one week 05/10/18- She is  here in follow up evaluation for [name] left great toe ulcer. It appears stable despite measurement change. We will continue with same treatment plan and follow up next week. 05/24/18-She is seen in follow-up evaluation for [name] left great toe ulcer. She remains compliant with offloading, has made significant improvement in her diet, decreasing the amount of sugar/soda. She said her recent A1c was 10.9 which is lower than. She did see [name] diabetic nutritionist/educator yesterday. She continues to smoke. We will continue with the  same treatment plan and she'll follow-up next week. 05/31/18- She is seen in follow-up evaluation for left great toe ulcer. She continues to remain compliant with offloading, continues to make improvement in her diet, increasing her water and decreasing the amount of sugar/soda. She does continue to smoke with no desire to quit. We will apply Prisma to the depth and Hydrofera Blue over. We have not received insurance authorization for oasis. She will follow up next week. 06/07/18-She is seen in follow-up evaluation for left great toe ulcer. It has stalled according to today's measurements although base appears stable. She says she saw [name] diabetic educator yesterday; her average blood sugars are less than 300 which is an improvement for her. She continues to smoke and states "that's my next step" She continues with water over soda. We will order for xray, culture and reinstate ace wrap compression prior to placing apligraf for next week. She is voicing no complaints or concerns. Her dressing will change to iodoflex over the next week in preparation for apligraf. 06/14/18-She is seen in follow-up evaluation for left great toe ulcer. Plain film x-ray performed last week was negative for osteomyelitis. Wound culture obtained last week grew strep B and OSSA; she is initiated on keflex and cefdinir today; there is erythema to the toe which could be from ace wrap compression, she Edmondson, Lariah [name]  ([id number]) [phone].pdf Page 5 of 21 has [name] history of wrapping too tight and has has been encouraged to maintain ace wraps that we place today. We will hold off on application of apligraf today, will apply next week after antibiotic therapy has been initiated. She admits today that she has resumed taking [name] shower with her foot/toe submerged in water, she has been reminded to keep foot/toe out of the bath water. She will be seen in follow up next week 06/21/18-she is seen in follow-up evaluation for left great toe ulcer. She is tolerating antibiotic therapy with no GI disturbance. The wound is stable. Apligraf was applied today. She has been decreasing her smoking, only had 4 cigarettes yesterday and 1 today. She continues being more compliant in diabetic diet. She will follow-up next week for evaluation of site, if stable will remove at 2 weeks. 06/28/18- She is here in follow up evalution. Apligraf was placed last week, she states the dressing fell off on Tuesday and she was dressing with hydrofera blue. She is healed and will be discharged from the clinic today. She has been instructed to continue with smoking cessation, continue monitoring glucose levels, offloading for an additional 4 weeks and continue with hydrofera blue for additional two weeks for any possible microscopic opening. Readmission: 08/07/18 on evaluation today patient presents for reevaluation concerning the ulcer of her right great toe. She was previously discharged on 06/28/18 healed. Nonetheless she states that this began to show signs of drainage she subsequently went to her primary care provider. Subsequently an x-ray was performed on 08/01/18 which was negative. The patient was also placed on antibiotics at that time. Fortunately they should have been effective for the infection. Nonetheless she's been experiencing some improvement but still has [name] lot of drainage coming from the wound itself. 08/14/18 on  evaluation today patient's wound actually does show signs of improvement in regard to the erythema at this point. She has completed the antibiotics. With that being said we did discuss the possibility of placing her in [name] total contact cast as of today although I think that I may want to give this  just [name] little bit more time to ensure nothing recurrence as far as her infection is concerned. I do not want to put in the cast and risk infection at that time if things are not completely resolved. With that being said she is gonna require some debridement today. 08/21/18 on evaluation today patient actually appears to be doing okay in regard to her toe ulcer. She's been tolerating the dressing changes without complication. With that being said it does appear that she is ready and in fact I think it's appropriate for [place] to go ahead and initiate the total contact cast today. Nonetheless she will require some sharp debridement to prepare the wound for application. Overall I feel like things have been progressing well but we do need to do something to get this to close more readily. 08/24/18 patient seen today for reevaluation after having had the total contact cast applied on Tuesday. She seems to have done very well the wound appears to be doing great and overall I'm pleased with the progress that she's made. There were no abnormal areas of rubbing from the cast on her lower extremity. 08/30/18 on evaluation today patient actually appears to be completely healed in regard to her plantar toe ulcer. She tells me at this point she's been having [name] lot of issues with the cast. She almost fell [name] couple of times the state shall the step of her dog [name] couple times as well. This is been [name] very frustrating process for her other nonetheless she has completely healed the wound which is excellent news. Overall there does not appear to be the evidence of infection at this time which is great news. 09/11/18 evaluation today  patient presents for follow-up concerning her great toe ulcer on the left which has unfortunately reopened since I last saw her which was only [name] couple of weeks ago. Unfortunately she was not able to get in to get the shoe and potentially the AFO that's gonna be necessary due to her left foot drop. She continues with offloading shoe but this is not enough to prevent her from reopening it appears. When we last had her in the total contact cast she did well from [name] healing standpoint but unfortunately the wound reopened as soon as she came out of the cast within just [name] couple of weeks. 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 This is also part of the reason why she falls on [name] regular basis. I just do not believe that is gonna be safe for [place] to reinitiate the total contact cast as last time we had this on she fell 3 times one week which is definitely not normal for her. 09/18/18 upon evaluation today the patient actually appears to be doing about the same in regard to her toe ulcer. She did not contact [employer] as I asked her to even though I had given her the prescription. In fact she actually states that she has no idea where the prescription is. She did apparently call [employer] and they told her that all she needed to do was bring the prescription in order to be able to be seen and work on getting the AFO for her left foot. With all that being said she still does not have an appointment and  I'm not sure were things stand that regard. I will give her [name] new prescription today in order to contact them to get this set up. 09/25/18 on evaluation today patient actually appears to be doing about the same in regard to her toes ulcer. She does have [name] small areas which seems to have [name] lot of callous buildup around the  edge of the wound which is going to need sharp debridement today. She still is waiting to be scheduled for evaluation with [employer] for possibility of an AFO. She states there supposed to call her tomorrow to get this set up. Unfortunately it does appear that her foot specifically the toe area is showing signs of erythema. There does not appear to be any systemic infection which is in these good news. 10/02/18 on evaluation today patient actually appears to be doing about the same in regard to her toe ulcer. This really has not done too well although it's not significantly larger it's also not significantly smaller. She has been tolerating the dressing changes without complication. She actually has her appointment with [employer] and [facility] tomorrow to hopefully be measured for obtaining and AFO splint. I think this would be helpful preventing this from reoccurring. We had contemplated starting the cast this week although to be honest I am reluctant to do that as she's been having nausea, vomiting, and seizure activity over the past three days. She has [name] history of seizures and have been told is nothing that can be done for these. With that being said I do believe that along with the seizures have the nausea vomiting which upon further questioning doesn't seem to be the normal for her and makes me concerned for the possibility of infection or something else going on. I discussed this with the patient and her mother during the office visit today. I do not feel the wound is effective but maybe something else. The responses this was "this just happens to her at times and we don't know why". They did not seem to be interested in going to the hospital to have this checked out further. 10/09/18 on evaluation today patient presents for follow-up concerning her ongoing toe ulcer. She has been tolerating the dressing changes without complication. Fortunately there does not appear to be any evidence of infection  which is great news however I do think that the patient would benefit from going ahead for with the total contact cast. She's actually in [name] wheelchair today she tells me that she will use her walker if we initiate the cast. I was very specific about the fact that if we were gonna do the cast I wanted to make sure that she was using the walker in order to prevent any falls. She tells me she does not have stairs that she has to traverse on [name] regular basis at her home. She has not had any seizures since last week again that something that happens to her often she tells me she did talk to [employer] and they said that it may take up to three weeks to get the brace approved for her. Hopefully that will not take that long but nonetheless in the meantime I do think the cast could be of benefit. 10/12/18 on evaluation today patient appears to be doing rather well in regard to her toe ulcer. It's just been [name] few days and already this is significantly improved both as far as overall appearance and size. Fortunately there's no sign of infection. She is here for  her first obligatory cast change. 10/19/18 Seen today for follow up and management of left great toe ulcer. Wound continues to show improvement. Noted small open area with seroussang drainage with palpation. Denies any increased pain or recent fevers during visit. She will continue calcium alginate with offloading shoe. Denies any questions or concerns during visit. 10/26/18 on evaluation today patient appears to be doing about the same as when I last saw her in regard to her wound bed. Fortunately there does not appear to be any signs of infection. Unfortunately she continues to have [name] breakdown in regard to the toe region any time that she is not in the cast. It takes almost no time at all for this to happen. Nonetheless she still has not heard anything from the brace being made by [employer] as to when exactly this will be available to her. Fortunately there is  no signs of infection at this time. 10/30/18 on evaluation today patient presents for application of the total contact cast as we just received him this morning. Fortunately we are gonna be able to apply this to her today which is great news. She continues to have no significant pain which is good news. Overall I do feel like things have been improving while she was the cast is when she doesn't have [name] cast that things get worse. She still has not really heard anything from [facility] regarding her brace. 11/02/18 upon evaluation today patient's wound already appears to be doing significantly better which is good news. Fortunately there does not appear to be any signs of infection also good news. Overall I do think the total contact cast as before is helping to heal this area unfortunately it's just not gonna likely keep the area closed and healed without her getting her brace at least. Again the foot drop is [name] significant issue for her. [name], [name] ([id number]) [id number] Page 6 of 21 11/09/18 on evaluation today patient appears to be doing excellent in regard to her toe ulcer which in fact is completely healed. Fortunately we finally got the situation squared away with the paperwork which was needed to proceed with getting her brace approved by Medicaid. I have filled that out unfortunately that information has been sent to the orthopedic office that I worked at 2 1/2 years ago and not tired Current wound care measures. Fortunately she seems to be doing very well at this time. 11/23/18 on evaluation today patient appears to be doing More Poorly Compared to Last Time I Saw Her. At [facility] She Had Completely Healed. Currently she is continuing to have issues with reopening. She states that she just found out that the brace was approved through Medicaid now she just has to go get measured in order to have this fitted for her and then made. Subsequently she does not have an  appointment for this yet that is going to complicate things we obviously cannot put her back in the cast if we do not have everything measured because they're not gonna be able to measure her foot while she is in the cast. 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 Obviously this is unfortunate and does have me somewhat worried as well. 11/30/18 on evaluation today patient's toe ulcer actually appears to be doing fairly well. The good news is she will be getting her brace in the shoes next week on Wednesday.  Hopefully we will be able to get this to heal without having to go back in the cast however she may need the cast in order to get the wound completely heal and then go from there. Fortunately there's no signs of infection at this time. 12/07/18 on evaluation today patient fortunately did receive her brace and she states she could tell this definitely makes her walk better. With that being said she's been having issues with her toe where she noticed yesterday there was [name] lot of tissue that was loosing off this appears to be much larger than what it was previous. She also states that her leg has been read putting much across the top of her foot just about the ankle although this seems to be receiving somewhat. The total area is still red and appears to be someone infected as best I can tell. She is previously taken Bactrim and that may be [name] good option for her today as well. We are gonna see what I wound culture shows as well and I think that this is definitely appropriate. With that being said outside of the culture I still need to initiate something in the interim and that's what I'm gonna go ahead and select Bactrim is [name] good option for her. 12/14/18 on evaluation today patient appears to be doing better in regard to her left great toe ulcer as compared to last week's evaluation. There's still  some erythema although this is significantly improved which is excellent news. Overall I do believe that she is making good progress is still gonna take some time before she is where I would like her to be from the standpoint of being able to place her back into the total contact cast. Hopefully we will be where we need to be by next week. 12/21/18 on evaluation today patient actually appears to be doing poorly in regard to her toe ulcer. She's been tolerating the dressing changes without complication. Fortunately there's no signs of systemic infection although she does have [name] lot of drainage from the toe ulcer and this does seem to be causing some issues at this point. She does have erythema on the distal portion of her toe that appears to be likely cellulitis. 12/28/18 on evaluation today patient actually appears to be doing [name] little better in my pinion in regard to her toe ulcer. With that being said she still does have some evidence of infection at this time and for her culture she had both E. coli as well as enterococcus as organisms noted on evaluation. For that reason I think that though the Keflex likely has treated the E. coli rather well this has really done nothing for the enterococcus. We are going to have to initiate treatment for this specifically. 01/04/19 on evaluation today patient's toe actually appears to be doing better from the standpoint of infection. She currently would like to see about putting the cash back on I think that this is appropriate as long as she takes care of it and keeps it from getting wet. She is gonna have some drainage we can definitely pass this up with Drawtex and alginate to try to prevent as much drainage as possible from causing the problems. With that being said I do want to at least try her with the cast between now and Tuesday. If there any issues we can't continue to use it then I will discontinue the use of the cast at that point. 01/08/19 on evaluation  today patient actually appears  to be doing very well as far as her foot ulcer specifically the great toe on the left is concerned. She did have an area of rubbing on the medial aspect of her left ankle which again is from the cast. Fortunately there's no signs of infection at this point in this appears to be [name] very slight skin breakdown. The patient tells me she felt it rubbing but didn't think it was that bad. Fortunately there is no signs of active infection at this time which is good news. No fevers, chills, nausea, or vomiting noted at this time. 01/15/19 on evaluation today patient actually appears to be doing well in regard to her toe ulcer. Again as previous she seems to do well and she has the cast on which indicates to me that during the time she doesn't have [name] cast on she's putting way too much pressure on this region. Obviously I think that's gonna be an issue as with the current national emergency concerning the Covid-19 Virus it has been recommended that we discontinue the use of total contact casting by the chief medical officer of our company, Dr. Simona Huh. The reasoning is that if [name] patient becomes sick and cannot come into have the cast removed they could not just leave this on for an additional two weeks. Obviously the hospitals also do not want to receive patient's who are sick into the emergency department to potentially contaminate the region and spread the Covid-19 Virus among other sick individuals within the hospital system. Therefore at this point we are suspending the use of total contact cast until the current emergency subsides. This was all discussed with the patient today as well. 01/22/19 on evaluation today patient's wound on her left great toe appears to be doing slightly worse than previously noted last week. She tells me that she has been on this quite [name] bit in fact she tells me she's been awake for 38 straight hours. This is due to the fact that she's having to care for  grandparents because nobody else will. She has been taking care of them for five the last seven days since I've seen her they both have dementia his is from [name] stroke and her grandmother's was progressive. Nonetheless she states even her mom who knows her condition and situation has only help two of those days to take care of them she's been taking care of the rest. Fortunately there does not appear to be any signs of active infection in regard to her toe at this point although obviously it doesn't look as good as it did previous. I think this is directly related to her not taking off the pressure and friction by way of taking things easy. Though I completely understand what's going on. 01/29/19 on evaluation today patient's tools are actually appears to be showing some signs of improvement today compared to last week's evaluation as far as not necessarily the overall size of the wound but the fact that she has some new skin growth in between the two ends of the wound opening. Overall I feel like she has done well she states that she had [name] family member give her what sounds to be [name] CAM walker boot which has been helpful as well. 02/05/19 on evaluation today patient's wound bed actually appears to be doing significantly better in regard to her overall appearance of the size of the wound. With that being said she is still having an issue with offloading efficiently enough to get this to  close. Apparently there is some signs of infection at this point as well unfortunately. Previously she's done well of Augmentin I really do not see anything that needs to be culture currently but there theme and cellulitis of the foot that I'm seeing I'm gonna go ahead and place her on an antibiotic today to try to help clear this up. 02/12/2019 on evaluation today patient actually appears to be doing poorly in regard to her overall wound status. She tells me she has been using her offloading shoe but actually comes in today  wearing her tennis shoe with the AFO brace. Again as I previously discussed with her this is really not sufficient to allow the area to heal appropriately. Nonetheless she continues to be somewhat noncompliant and I do wonder based on what she has told my nurse in the past as to whether or not [name] good portion of this noncompliance may be recreational drug and alcohol related. She has had [name] history of heroin overdose and this was fairly recently in the past couple of months that have been seeing her. Nonetheless overall I feel like her wound looks significantly worse today compared to what it was previous. She still has significant erythema despite the Augmentin I am not sure that this is an appropriate medication for her infection I am also concerned that the infection may have gone down into her bone. 02/19/19 on evaluation today patient actually appears to be doing about the same in regard to her toe ulcer. Unfortunately she continues to show signs of bone exposure and infection at this point. There does not appear to be any evidence of worsening of the infection but I'm also not really sure that it's getting significantly better. She is on the Augmentin which should be sufficient for the Staphylococcus aureus infection that she has at this point. With that being said she may need IV antibiotics to more appropriately treat this. We did have [name] discussion today about hyperbaric option therapy. 02/28/19 on evaluation today patient actually appears to be doing much worse in regard to the wound on her left great toe as compared to even my previous evaluation last week. Unfortunately this seems to be training in [name] pretty poor direction. Her toe was actually now starting to angle laterally and I can actually see the entire joint area of the proximal portion of the digit where is the distal portion of the digit again is no longer even in contact with the joint line. Unfortunately there's [name] lot more necrotic  tissue around the edge and the toe appears to be showing signs of becoming gangrenous in my pinion. I'm very concerned about were things stand at this point. She did see infectious disease and they are planning to send in [name] prescription for Sivextro for her and apparently this has been approved. With that being said I don't think she should avoid taking this but at the same time I'm not sure that it's gonna be sufficient Palmisano, Taneah [name] ([id number]) [phone].pdf Page 7 of 21 to save her toe at this point. She tells me that she still having to care for grandparents which I think is putting quite [name] bit of strain on her foot and specifically the total area and has caused this to break down even to [name] greater degree than would've otherwise been expected. 03/05/19 on evaluation today patient actually appears to be doing quite well in regard to her toe all things considering. She still has bone exposed but there appears to  be much less your thing on overall the appearance of the wound and the toe itself is dramatically improved. She still does have some issues currently obviously with infection she did see vascular as well and there concerned that her blood flow to the toad. For that reason they are setting up for an angiogram next week. 03/14/19 on evaluation today patient appears to be doing very poor in regard to her toe and specifically in regard to the ulceration and the fact that she's starting to notice the toe was leaning even more towards the lateral aspect and the complete joint is visible on the proximal aspect of the joint. Nonetheless she's also noted [name] significant odor and the tip of the toe is turning more dark and necrotic appearing. Overall I think she is getting worse not better as far as this is concerned. For that reason I am recommending at this point that she likely needs to be seen for likely amputation. READMISSION 03/19/2021 This is [name] patient that we cared  for in this clinic for [name] prolonged period of time in 2019 and 2020 with [name] left foot and left first toe wound. 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 Luana Shu. In December 2021 she had an ulcer on her right great toe as well as the fourth and fifth toes. 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 She also had an angiogram at that time and underwent angioplasty of the right anterior tibial artery. In any case she claims that the wound on the right foot is closed I did not look at this today which was probably an oversight although I think that should be done next week. 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 According to Dr. Deborra Medina last review that she was out of the area being cared for by another physician but recently came back to his attention. 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 [name] culture of this area showed E. coli apparently before she came back to see Dr. Luana Shu she was supposed to be receiving antibiotics but she did not really take them. Nor is she offloading this area at all. Finally her last hemoglobin A1c listed in epic was in March 2022 at 14.1 she says things are [name] lot better since then although I am not sure. 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 She had this wound at the time. During this admission she had E. coli sepsis [name] TEE was negative. 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 She is still [name] heavy cigarette smoker. She has  [name] history of bipolar disorder seizure disorder ABI in our clinic was 1.05 6/1; left midfoot in the setting of [name] TMA done previously. Round circular wound with [name] "knuckle" of protruding tissue. The problem is that the knuckle was not attached to any of the surrounding granulation and this probed proximally widely I removed [name] large portion of this tissue. This wound goes with considerable undermining laterally. I do not feel any bone there was no purulence but  this is [name] deep wound. 6/8; in spite of the debridement I did last week. She arrives with [name] wound looking exactly the same. [name] protruding "knuckle" of tissue nonadherent to most of the surrounding tissue. There is considerable depth around this from 6-12 o'clock at 2.7 cm and undermining of 1 cm. This does not look overtly infected and the x- ray I did last week was negative for any osseous abnormalities. We have been using silver collagen 6/15; deep tissue culture I did last week showed moderate staph aureus and moderate Pseudomonas. This will definitely require prolonged antibiotic therapy. The pathology on the protuberant area was negative for malignancy fungus etc. 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 We have been using silver collagen. I am going to be prescribing Levaquin for 2 weeks[address] Her CT scan of the foot is down for 7/5 6/22; CT scan of the foot on 7 5. She says she has hardware in the left leg from her previous fracture. She is on the Levaquin for the deep tissue culture I did that showed methicillin sensitive staph aureus and Pseudomonas. I gave her [name] 2-week supply and she will have another week. She arrives in clinic today with the same protuberant tissue however this is nonadherent to the tissue surrounding it. I am really at [name] loss to explain this unless there is underlying deep tissue infection 6/29; patient presents for 1 week follow-up. She has been using collagen to  the wound bed. She reports taking her antibiotics as prescribed.She has no complaints or issues today. She denies signs of infection. 7/6; patient presents for one week followup. She has been using collagen to the wound bed. She states she is taking Levaquin however at times she is not able to keep it down. She denies signs of infection. 7/13; patient presents for 1 week follow-up. She has been using silver alginate to the wound bed. She still has nausea when taking her antibiotics. She denies signs of infection. 7/20; patient presents for 1 week follow-up. She has been using silver alginate with gentamicin cream to the wound bed. She denies any issues and has no complaints today. She denies signs of infection. 7/27; patient presents for 1 week follow-up. She continues to use silver alginate with gentamicin cream to the wound bed. She reports starting her antibiotics. She has no issues or complaints. Overall she reports stability to the wound. 8/3; patient presents for 1 week follow-up. She has been using silver alginate with gentamicin cream to the wound bed. She reports completing all antibiotics. She has no issues or complaints today. She denies signs of infection. 8/17; patient presents for 2-week follow-up. He is to use silver alginate to the wound bed. She has no issues or complaints today. She denies signs of infection. She reports her pain has improved in her foot since last clinic visit 8/24; patient presents for 1 week follow-up. She continues to use silver alginate to the wound bed. She has no issues or complaints. She denies signs of infection. Pain is stable. 9/7; patient presents for follow-up. She missed her last week appointment due to feeling ill. She continues to use silver alginate. She has [name] new wound to the right lower extremity that is covered in eschar. She states It occurred over the past week and has no idea how it started. She currently denies signs of infection. 9/14;  patient presents for follow-up. T the left foot wound she has been using gentamicin cream  and silver alginate. T the right lower extremity wound she has o o been keeping this covered and has not obtain Santyl. 9/21; patient presents for follow-up. She reports using gentamicin cream and silver alginate to the left foot and Santyl to the right lower extremity wound. She has no issues or complaints today. She denies signs of infection. 9/28; patient presents for follow-up. She reports [name] new wound to her right heel. She states this occurred [name] few days ago and is progressively gotten worse. She has been trying to clean the area with [name] Q-tip and Santyl. She reports stability in the other 2 wounds. She has been using gentamicin cream and silver alginate to the left foot and Santyl to the right lower extremity wound. 10/12; patient presents for follow-up. She reports improvement to the wound beds. She is seeing vein and vascular to discuss the potential of [name] left BKA. She states they are going to do an arteriogram. She continues to use silver alginate with dressing changes to her wounds. 11/2; patient presents for follow-up. She states she has not been doing dressing changes to the wound beds. She states she is not able to offload the areas. She reports chronic pain to her left foot wound. 11/9; patient presents for follow-up. She came in with only socks on. She states she forgot to put on shoes. It is unclear if she is doing any dressing changes. She currently denies systemic signs of infection. 11/16; patient presents for follow-up. She came again only with socks on. She states she does not wear shoes ever. It is unclear if she does dressing changes. She currently denies systemic signs of infection. 11/23; patient presents for follow-up. She wore her shoes today. It still unclear exactly what dressing she is using for each wound but she did states she obtained Dakin's solution and has been using this to  the left foot wound. She currently denies signs of infection. 11/30; patient presents for follow-up. She has no issues or complaints today. She currently denies signs of infection. [name], [name] ([id number]) [id number] Page 8 of 21 12/7; patient presents for follow-up. She has no issues or complaints today. She has been using Hydrofera Blue to the right heel wound and Dakin solution to the left foot wound. Her right anterior leg wound is healed. She currently denies signs of infection. 12/14; patient presents for follow-up. She has been using Hydrofera Blue to the right heel and Dakin's to the left foot wounds. She has no issues or complaints today. She denies signs of infection. 12/21; patient presents for follow-up. She reports using Hydrofera Blue to the right heel and Dakin's to the left foot wound. She denies signs of infection. 12/28; patient presents for follow-up. She continues to use Dakin's to the left foot wound and Hydrofera Blue to the right heel wound. She denies signs of infection. 1/4; patient presents for follow-up. She has no issues or complaints today. She denies signs of infection. 1/11; patient presents for follow-up. It is unclear if she has been dressing these wounds over the past week. She currently denies signs of infection. 1/18; patient presents for follow-up. She states she has been using Dakin's wet-to-dry dressings to the left foot. She has been using Hydrofera Blue to the right foot foot wound. She states that the anterior right leg wound has reopened and draining serous fluid. She denies signs of infection. 1/25; patient presents for follow-up. She has no issues or complaints today. 2/1; patient presents for follow-up. She has  no issues or complaints today. She denies signs of infection. 2/8; patient presents for follow-up. She has lost her surgical shoes. She did not have [name] dressing to the right heel wound. She currently denies signs  of infection. 2/15; patient presents for follow-up. She reports more pain to the right heel today. She denies purulent drainage Or fever/chills 2/22; patient presents for follow-up. She reports taking clindamycin over the past week. She states that she continues to have pain to her right heel. She reports purulent drainage. Readmission 03/16/2022 Ms. Marvalee Mcbroom is [name] 48 year old female with [name] past medical history of type 2 diabetes, osteomyelitis to her feet, chronic systolic heart failure and bipolar disorder that presents to the clinic for bilateral feet wounds and right lower extremity wound. She was last seen in our clinic on 12/15/2021. At that time she had purulent drainage coming out of her right plantar foot and I recommended she go to the ED. She states she went to [facility] and has been there for the past 3 months. I cannot see the records. She states she had OR debridement and was on several weeks of IV antibiotics while inpatient. Since discharge she has not been taking care of the wound beds. She had nothing on her feet other than socks today. She currently denies signs of infection. 5/31; patient presents for follow-up. She has been using Dakin's wet-to-dry dressings to the wound beds on her feet bilaterally and antibiotic ointment to the right anterior leg wound. She had [name] wound culture done at last clinic visit that showed moderate Pseudomonas aeruginosa sensitive to ciprofloxacin. She currently denies systemic signs of infection. 6/14; patient presents for follow-up. She received Keystone 5 days ago and has been using this on the wound beds. She states that last week she had to go to the hospital because she had increased warmth and erythema to the right foot. She was started on 2 oral antibiotics. She states she has been taking these. She currently denies systemic signs of infection. She has no issues or complaints today. 6/21; patient presents for follow-up. She  states she has been using Keystone antibiotics to the wound beds. She has no issues or complaints today. She denies signs of infection. 6/28; patient presents for follow-up. She has been using Keystone antibiotics to the wound beds. She has no issues or complaints today. 7/12; patient presents for follow-up. Has been using Keystone antibiotics to the wound beds with calcium alginate. She has no issues or complaints today. She never followed up with her orthopedic surgeon who did the OR debridement to the right foot. We discussed the total contact cast for the left foot and patient would like to do this next week. 7/19; patient presents for follow-up. She has been using Keystone antibiotics with calcium alginate to the wound beds. She has no issues or complaints today. Patient is in agreement to do the total contact cast of the left foot today. She knows to return later this week for the obligatory cast change. 05-13-2022 upon evaluation today patient's wound which she has the cast of the left leg actually appears to be doing significantly better. Fortunately I do not see any signs of active infection locally or systemically which is great news and overall I am extremely pleased with where we stand currently. 7/26; patient presents for follow-up. She has [name] cast in place for the past week. She states it irritated her shin. Other than that she tolerated the cast well. She states she would  like [name] break for 1 week from the cast. We have been using Keystone antibiotic and Aquacel to both wound beds. She denies signs of infection. 8/2; patient presents for follow-up. She has been using Keystone and Aquacel to the wound beds. She denies any issues and has no complaints. She is agreeable to have the cast placed today for the left leg. 06-03-2022 upon evaluation today patient appears to be doing well with regard to her wound she saw some signs of improvement which is great news. Fortunately I do not see any  evidence of active infection locally or systemically at this time which is great news. No fevers, chills, nausea, vomiting, or diarrhea. 8/16; patient presents for follow-up. She has no issues or complaints today. We have been using Keystone and Aquacel to the wound beds. The left lower extremity is in [name] total contact cast. She is tolerated this well. 8/23; patient presents for follow-up. She has had the total contact cast on the left leg for the past week. Unfortunately this has rubbed and broken down the skin to the medial foot. She currently denies signs of infection. She has been using Keystone antibiotic to the right plantar foot wound. 8/30; patient presents for follow-up. We have held off on the total contact cast for the left leg for the past week. Her wound on the left foot has improved and the previous surrounding breakdown of skin has epithelialized. She has been using Keystone antibiotic to both wound beds. She has no issues or complaints today. She denies signs of infection. 9/6; patient presents for follow-up. She has ordered her's Keystone antibiotic and this is arriving this week. She has been doing Dakin's wet-to-dry dressings to the wound beds. She denies signs of infection. She is agreeable to the total contact cast today. 9/13; patient presents for follow-up. She states that the cast caused her left leg shin to be sore. She would like to take [name] break from the cast this week. She has been using Keystone antibiotic to the right plantar foot wound. She denies signs of infection. 9/20; patient presents for follow-up. She has been using Keystone antibiotics to the wound beds with calcium alginate to the right foot wound and Hydrofera Blue to the left foot wound. She is agreeable to having the cast placed today. She has been approved for Apligraf and we will order this for next clinic visit. 9/27; patient presents for follow-up. We have been using Keystone antibiotic with Hydrofera Blue  to the left foot wound under [name] total contact cast. T the right o Rivard, Gwendelyn [name] ([id number]) [id number].pdf Page 9 of 21 foot wound she has been using Keystone antibiotic and calcium alginate. She declines [name] total contact cast today. Apligraf is available for placement and she would like to proceed with this. 07-28-2022 upon evaluation today patient appears to be doing well currently in regard to her wound. She is actually showing signs of significant improvement which is great news. Fortunately I do not see any evidence of active infection locally nor systemically at this time. She has been seeing Dr. Heber [place] and to be honest has been doing very well with the cast. Subsequently she comes in today with [name] cast on and we did reapply that today as well. She did not really want to she try to talk me out of that but I explained that if she wanted to heal this is really the right way to go. Patient voiced understanding. In regard to her right foot this is  actually [name] lot better compared to the last time I saw her which is also great news. 10/11; patient presents for follow-up. Apligraf and the total contact cast was placed to the left leg at last clinic visit. She states that her right foot wound had burning pain to it with the placement of Apligraf to this area. She has been doing [place] over this area. She denies signs of infection including increased warmth, erythema or purulent drainage. 11/1; 3-week follow-up. The patient fortunately did not have [name] total contact cast or an Apligraf and on the left foot. She has been using Keystone ABD pads and kerlix and her own running shoes She arrives in clinic today with thick callus and [name] very poor surface on the left foot on the right nonviable skin subcutaneous tissue and [name] deep probing hole. 11/15; patient missed her last clinic appointment. She states she has not been dressing the wound beds for the past 2 weeks. She states that at  she had [name] new roommate but is now going back to live with her mother. Apparently its been [name] distracting 2 weeks. Patient currently denies signs of infection. 11/22; patient presents for follow-up. She states she has been using Keystone antibiotic and Dakin's wet-to-dry dressings to the wound beds. She is agreeable for cast placement today. We had ordered Apligraf however this has not been received by our facility. 11/29; Patient had [name] total contact cast placed at last clinic visit and she tolerated this well. We were using silver alginate under the cast. Patient's been using Keystone antibiotic with Aquacel to the right plantar foot wound. She has no issues or complaints today. Apligraf is available for placement today. Patient would like to proceed with this. 12/6; patient presents for follow-up. She had Apligraf placed in standard fashion last clinic visit under the total contact cast to the left lower extremity. She has been using Keystone antibiotic and Aquacel to the right plantar foot wound. She has no issues or complaints today. 12/13; patient presents for follow-up. She has finished 5 Apligraf placements. Was told she would not qualify for more. We have been doing [name] total contact cast to the left lower extremity. She has been using Keystone antibiotic and Aquacel to the right plantar foot wound. She has no issues or complaints today. 12/20; patient presents for follow-up. We have been using Hydrofera Blue with Keystone antibiotic under [name] total contact cast of the left lower extremity. She reports using Keystone antibiotic and silver alginate to the right heel wound. She has no issues or complaints today. 12/27; patient presents with [name] healthy wound on the left midfoot. We have Apligraf to apply that to that more also using [name] total contact cast. On the right we are using Keystone and silver alginate. She is offloading the right heel with [name] surgical shoealthough by her admission she is on her  feet quite [name] bit 1/3; patient presents for follow-up. Apligraf was placed to the wound beds last clinic visit. She was placed in [name] total contact cast to the left lower extremity. She declines [name] total contact cast today. She states that her mother is in the hospital and she cannot adequately get around with the cast on. 1/10; patient presents for follow-up. She declined the total contact cast at last clinic visit. Both wounds have declined in appearance. She states that she has been on her feet and not offloading the wound beds. She currently denies signs of infection. 1/17; patient presents for follow-up. She had  the total contact cast along with Apligraf placed last week to the left lower extremity. She tolerated this well. She has been using Aquacel Ag and Keystone antibiotic to the right heel wound. She currently denies signs of infection. She has no issues or complaints today. 1/24; patient presents for follow-up. We have been using Apligraf to the left foot wound along with [name] total contact cast. She has done well with this. T the right o heel wound she has been using Aquacel Ag and Keystone antibiotic ointment. She has no issues or complaints today. She denies signs of infection. 1/31; patient presents for follow-up. We have been using Apligraf to the left foot wound along with the total contact cast. She continues to do well with this. To the right heel we have been using Aquacel Ag and Keystone antibiotic ointment. She has no issues or complaints today. 12-01-2022 upon evaluation patient is seen today on my schedule due to the fact that she unfortunately was in the hospital yesterday. Her cast needed to come off the only reason she is out of the hospital is due to the fact that they would not take it off at the hospital which is somewhat bewildered reading to me to be perfectly honest. I am not certain why this was but either way she was released and then was placed on my schedule today in order  to get this off and reapply the total contact casting as appropriate. I do not have an Apligraf for her today it was applied last week and today's actually expired yesterday as there was some scheduling conflicts with her being in the hospital. Nonetheless we do not have that for reapplication today but the good news that she is not draining too much and the Apligraf can go for up to 2 weeks so I am going to go ahead and reapply the total contact casting but we are going to leave the Apligraf in place. 2/14; patient presents for follow-up. T the left leg she has had the total contact cast and Apligraf for the past week. She has had no issues with this. T the o o right heel she has been using antibiotic ointment and Aquacel Ag. Electronic Signature(s) Signed: 12/07/2022 3:27:54 PM By: Kalman Shan DO Entered By: Kalman Shan on 12/07/2022 13:16:40 -------------------------------------------------------------------------------- Physical Exam Details Patient Name: Date of Service: [name] 12/07/2022 11:00 [name]: [id number] Patient Account Number: [id number] [name], [name] ([id number]) [phone].pdf Page 10 of 21 Date of Birth/Sex: Treating RN: 06/27/1976 (47 y.o. [name] Primary Care Provider: Other Clinician: Glade Nurse Referring Provider: Treating Provider/Extender: Eddie North in Treatment: 18 Constitutional . Cardiovascular . Psychiatric . Notes Right foot: T the plantar heel there is an incision site with increased depth, No probing to bone. T the opening there is Nonviable tissue and granulation tissue. o o Left foot: T the medial aspect there is an open wound with granulation tissue and nonviable tissue. No overt signs of infection to any of the wound beds. o Electronic Signature(s) Signed: 12/07/2022 3:27:54 PM By: Kalman Shan DO Entered By: Kalman Shan  on 12/07/2022 13:17:09 -------------------------------------------------------------------------------- Physician Orders Details Patient Name: Date of Service: [name] 12/07/2022 11:00 [name]: [id number] Patient Account Number: [id number] Date of Birth/Sex: Treating RN: January 06, 1976 (47 y.o. [name] Primary Care Provider: Raelene Bott Other Clinician: Massie Kluver Referring Provider: Treating Provider/Extender: Eddie North in Treatment:  52 Verbal / Phone Orders: No Diagnosis Coding Follow-up Appointments Return Appointment in 1 week. Nurse Visit as needed Bathing/ Shower/ Hygiene May shower with wound dressing protected with water repellent cover or cast protector. No tub bath. Anesthetic (Use 'Patient Medications' Section for Anesthetic Order Entry) Lidocaine applied to wound bed Cellular or Tissue Based Products Cellular or Tissue Based Product Type: - Apligraf applied remained in place from last application Cellular or Tissue Based Product applied to wound bed; including contact layer, fixation with steri-strips, dry gauze and cover dressing. (DO NOT REMOVE). Edema Control - Lymphedema / Segmental Compressive Device / Other Elevate, Exercise Daily and [name] void Standing for Long Periods of Time. Elevate legs to the level of the heart and pump ankles as often as possible Elevate leg(s) parallel to the floor when sitting. Off-Loading Total Contact Cast to Left Lower Extremity Open toe surgical shoe - Heel off loader - Right foot Other: - keep pressure off of feet. Additional Orders / Instructions Follow Nutritious Diet and Increase Protein Intake Other: - Stay off of foot, wheelchair only [name], [name] ([id number]) [phone].pdf Page 11 of 21 Medications-Please add to medication list. Wound #11 Right,Plantar Foot Keystone Compound - use at home ntibiotic - apply Gentamycin in office  only Topical [name] Wound Treatment Wound #11 - Foot Wound Laterality: Plantar, Right Cleanser: Wound Cleanser 1 x Per Day/30 Days Discharge Instructions: Wash your hands with soap and water. Remove old dressing, discard into plastic bag and place into trash. Cleanse the wound with Wound Cleanser prior to applying [name] clean dressing using gauze sponges, not tissues or cotton balls. Do not scrub or use excessive force. Pat dry using gauze sponges, not tissue or cotton balls. Topical: Gentamicin 1 x Per Day/30 Days Discharge Instructions: Apply as directed by provider. in clinic Topical: keystone gel 1 x Per Day/30 Days Discharge Instructions: at home Prim Dressing: Aquacel Extra Hydrofiber Dressing, 2x2 (in/in) ary 1 x Per Day/30 Days Secondary Dressing: ABD Pad 5x9 (in/in) (Generic) 1 x Per Day/30 Days Discharge Instructions: Cover with ABD pad Secured With: Medipore T - 55M Medipore H Soft Cloth Surgical T ape ape, 2x2 (in/yd) 1 x Per Day/30 Days Secured With: [employer] Sterile or Non-Sterile 6-ply 4.5x4 (yd/yd) 1 x Per Day/30 Days Discharge Instructions: Apply Kerlix as directed Wound #12 - Foot Wound Laterality: Plantar, Left, Medial Cleanser: Wound Cleanser 1 x Per Day/30 Days Discharge Instructions: Wash your hands with soap and water. Remove old dressing, discard into plastic bag and place into trash. Cleanse the wound with Wound Cleanser prior to applying [name] clean dressing using gauze sponges, not tissues or cotton balls. Do not scrub or use excessive force. Pat dry using gauze sponges, not tissue or cotton balls. Topical: Gentamicin 1 x Per Day/30 Days Discharge Instructions: Apply as directed by provider. in clinic Secondary Dressing: ABD Pad 5x9 (in/in) (Generic) 1 x Per Day/30 Days Discharge Instructions: Cover with ABD pad Secured With: Medipore T - 55M Medipore H Soft Cloth Surgical T ape ape, 2x2 (in/yd) 1 x Per Day/30 Days Secured With: [employer] Sterile or Non-Sterile 6-ply  4.5x4 (yd/yd) 1 x Per Day/30 Days Discharge Instructions: Apply Kerlix as directed Electronic Signature(s) Signed: 12/07/2022 3:27:54 PM By: Kalman Shan DO Previous Signature: 12/07/2022 12:00:48 PM Version By: Kalman Shan DO Entered By: Kalman Shan on 12/07/2022 13:21:10 -------------------------------------------------------------------------------- Problem List Details Patient Name: Date of Service: [name] 12/07/2022 11:00 [name]: [id number] Patient Account Number: [id number] Date  of Birth/Sex: Treating RN: 01-21-1976 (47 y.o. 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 03/16/2022 No Yes E11.621 Type 2 diabetes mellitus with foot ulcer 03/16/2022 No Yes E11.42 Type 2 diabetes mellitus with diabetic polyneuropathy 03/16/2022 No Yes L97.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 03/16/2022 No Yes of skin Inactive Problems Resolved Problems Electronic Signature(s) Signed: 12/07/2022 3:27:54 PM By: Kalman Shan DO Entered By: Kalman Shan on 12/07/2022 13:15:20 -------------------------------------------------------------------------------- Progress Note Details Patient Name: Date of Service: [name] 12/07/2022 11:00 [name]: [id number] Patient Account Number: [id number] Date of Birth/Sex: Treating RN: July 06, 1976 (47 y.o. [name] Primary Care Provider: Raelene Bott Other Clinician: Massie Kluver Referring Provider: Treating Provider/Extender: Eddie North in Treatment: 69 Subjective Chief Complaint Information obtained from Patient 03/19/2021; patient referred by Dr. Luana Shu who has been looking after her left foot for quite [name] period of time for review of [name] nonhealing area in the left midfoot 03/12/2022; bilateral feet wounds and right lower extremity wound. History of Present Illness (HPI) 01/18/18-She is here for initial evaluation of the left great toe ulcer. She is [name] poor historian in regards to timeframe in detail. She states approximately 4 weeks ago she lacerated her toe on something in the house. She followed up with her primary care who placed her on Bactrim and ultimately [name] second dose of Bactrim prior to coming to wound clinic. She states she has been treating the toe with peroxide, Betadine and [name] Band-Aid. She did not check her blood sugar this morning but checked it yesterday morning it was 327; she is unaware of [name] recent A1c and there are no current records. She saw Dr. she would've orthopedics last week for an old injury to the left ankle, she states he did not see her toe, nor did she bring it to his attention. She smokes approximately 1 pack cigarettes [name] day. Her social situation is concerning, she arrives this morning with her mother who appears extremely intoxicated/under the influence; her mother was asked to leave the room and be monitored by the patient's grandmother. The patient's aunt then accompanied the patient and the room throughout the rest of the appointment. We had [name] lengthy discussion regarding the deleterious effects of uncontrolled hyperglycemia and smoking as it relates to wound healing and overall health. She was strongly encouraged to decrease her smoking and get her diabetes under better control. She states she is currently on [name] diet and has cut down her [facility] consumption. The left toe  is erythematous, macerated and slightly edematous with malodor present. The edema in her left foot is [name], [name] ([id number]) [phone] Page 13 of 21 below her baseline, there is no erythema streaking. We will treat her with Santyl, doxycycline; we have ordered and xray, culture and provided [name] Peg assist surgical shoe and cultured the wound. 01/25/18-She is here in follow-up evaluation for [name] left great toe ulcer and presents with an abscess to her suprapubic area. She states her blood sugars remain elevated, feeling "sick" and if levels are  below 250, but she is trying. She has made no attempt to decrease her smoking stating that we "can't take away her food in her cigarettes". She has been compliant with offloading using the PEG assist you. She is using Santyl daily. the culture obtained last week grew staph aureus and Enterococcus faecalis; continues on the doxycycline and Augmentin was added on Monday. The suprapubic area has erythema, no femoral variation, purple discoloration, minimal induration, was accessed with [name] cotton tip applicator with sanguinopurulent drainage, this was cultured, I suspect the current antibiotic treatment will cover and we will not add anything to her current treatment plan. She was advised to go to urgent care or ER with any change in redness, induration or fever. 02/01/18-She is here in follow-up evaluation for left great toe ulcers and [name] new abdominal abscess from last week. She was able to use packing until earlier this week, where she "forgot it was there". She states she was feeling ill with GI symptoms last week and was not taking her antibiotic. She states her glucose levels have been predominantly less than 200, with occasional levels between 200-250. She thinks this was contributing to her GI symptoms as they have resolved without intervention. There continues to be significant laceration to left toe, otherwise it clinically looks  stable/improved. There is now less superficial opening to the lateral aspect of the great toe that was residual blister. We will transition to [facility] to all wounds, she will continue her Augmentin. If there is no change or deterioration next week for reculture. 02/08/18-She is here in follow-up evaluation for left great toe ulcer and abdominal ulcer. There is an improvement in both wounds. She has been wrapping her left toe with coban, not by our direction, which has created an area of discoloration to the medial aspect; she has been advised to NOT use coban secondary to her neuropathy. She states her glucose levels have been high over this last week ranging from 200-350, she continues to smoke. She admits to being less compliant with her offloading shoe. We will continue with same treatment plan and she will follow-up next week. 02/15/18-She is here in follow-up evaluation for left great toe ulcer and abdominal ulcer. The abdominal ulcer is epithelialized. The left great toe ulcer is improved and all injury from last week using the Coban wrap is resolved, the lateral ulcer is healed. She admits to noncompliance with wearing offloading shoe and admits to glucose levels being greater than 300 most of the week. She continues to smoke and expresses no desire to quit. There is one area medially that probes deeper than it has historically, erythema to the toe and dorsal foot has consistently waxed and waned. There is no overt signs of cellulitis or infection but we will culture the wound for any occult infection given the new area of depth and erythema. We will hold off on sensitivities for initiation of antibiotic therapy. 02/22/18-She is here in follow up evaluation for left great toe ulcer. There is overall significant improvement in both wound appearance, erythema and edema with changes made last week. She was not initiated on antibiotic therapy. Culture obtained last week showed oxacillin sensitive  staph aureus, sensitive to clindamycin. Clindamycin has been called into the pharmacy but she has been instructed to hold off on initiation secondary to overall clinical improvement and her history of antibiotic intolerance. She has been instructed to contact the clinic with any noted changes/deterioration and the wound, erythema, edema and/or pain. She will follow-up  next week. She continues to smoke and her glucose levels remain elevated >250; she admits to compliance with offloading shoe 03/01/18 on evaluation today patient appears to be doing fairly well in regard to her left first toe ulcer. She has been tolerating the dressing changes with the [facility] Dressing without complication and overall this has definitely showed signs of improvement according to records as well is what the patient tells me today. I'm very pleased in that regard. She is having no pain today 03/08/18 She is here for follow up evaluation of [name] left great toe ulcer. She remains non-compliant with glucose control and smoking cessation; glucose levels consistently >200. She states that she got new shoe inserts/peg assist. She admits to compliance with offloading. Since my last evaluation there is significant improvement. We will switch to prisma at this time and she will follow up next week. She is noted to be tachycardic at this appointment, heart rate 120s; she has [name] history of heart rate 70-130 according to our records. She admits to extreme agitation r/t personal issues; she was advised to monitor her heartrate and contact her physician if it does not return to [name] more normal range (<100). She takes cardizem twice daily. 03/15/18-She is here in follow-up evaluation for left great toe ulcer. She remains noncompliant with glucose control and smoking cessation. She admits to compliance with wearing offloading shoe. The ulcer is improved/stable and we will continue with the same treatment plan and she will follow-up next  week 03/22/18-She is here for evaluation for left great toe ulcer. There continues to be significant improvement despite recurrent hyperglycemia (over 500 yesterday) and she continues to smoke. She has been compliant with offloading and we will continue with same treatment plan and she will follow-up next week. 03/29/18-She is here for evaluation for left great toe ulcer. Despite continuing to smoke and uncontrolled diabetes she continues to improve. She is compliant with offloading shoe. We will continue with the same treatment plan and she will follow-up next week 04/05/18- She is here in follow up evaluation for [name] left great toe ulcer; she presents with small pustule to left fifth toe (resembles ant bite). She admits to compliance with wearing offloading shoe; continues to smoke or have uncontrolled blood glucose control. There is more callus than usual with evidence of bleeding; she denies known trauma. 04/12/18-She is here for evaluation of left great toe ulcer. Despite noncompliance with glycemic control and smoking she continues to make improvement. She continues to wear offloading shoe. The pustule, that was identified last week, to the left fifth toe is resolved. She will follow-up in 2 weeks 05/03/18-she is seen in follow-up evaluation for [name] left great toe ulcer. She is compliant with offloading, otherwise noncompliant with glycemic control and smoking. She has plateaued and there is minimal improvement noted. We will transition to [facility], replaced the insert to her surgical shoe and she will follow-up in one week 05/10/18- She is here in follow up evaluation for [name] left great toe ulcer. It appears stable despite measurement change. We will continue with same treatment plan and follow up next week. 05/24/18-She is seen in follow-up evaluation for [name] left great toe ulcer. She remains compliant with offloading, has made significant improvement in her diet, decreasing the amount of sugar/soda.  She said her recent A1c was 10.9 which is lower than. She did see [name] diabetic nutritionist/educator yesterday. She continues to smoke. We will continue with the same treatment plan and she'll follow-up next  week. 05/31/18- She is seen in follow-up evaluation for left great toe ulcer. She continues to remain compliant with offloading, continues to make improvement in her diet, increasing her water and decreasing the amount of sugar/soda. She does continue to smoke with no desire to quit. We will apply Prisma to the depth and Hydrofera Blue over. We have not received insurance authorization for oasis. She will follow up next week. 06/07/18-She is seen in follow-up evaluation for left great toe ulcer. It has stalled according to today's measurements although base appears stable. She says she saw [name] diabetic educator yesterday; her average blood sugars are less than 300 which is an improvement for her. She continues to smoke and states "that's my next step" She continues with water over soda. We will order for xray, culture and reinstate ace wrap compression prior to placing apligraf for next week. She is voicing no complaints or concerns. Her dressing will change to iodoflex over the next week in preparation for apligraf. 06/14/18-She is seen in follow-up evaluation for left great toe ulcer. Plain film x-ray performed last week was negative for osteomyelitis. Wound culture obtained last week grew strep B and OSSA; she is initiated on keflex and cefdinir today; there is erythema to the toe which could be from ace wrap compression, she has [name] history of wrapping too tight and has has been encouraged to maintain ace wraps that we place today. We will hold off on application of apligraf today, will apply next week after antibiotic therapy has been initiated. She admits today that she has resumed taking [name] shower with her foot/toe submerged in water, she has been reminded to keep foot/toe out of the bath water. She  will be seen in follow up next week 06/21/18-she is seen in follow-up evaluation for left great toe ulcer. She is tolerating antibiotic therapy with no GI disturbance. The wound is stable. Apligraf was applied today. She has been decreasing her smoking, only had 4 cigarettes yesterday and 1 today. She continues being more compliant in diabetic diet. She will follow-up next week for evaluation of site, if stable will remove at 2 weeks. 06/28/18- She is here in follow up evalution. Apligraf was placed last week, she states the dressing fell off on Tuesday and she was dressing with hydrofera blue. She is healed and will be discharged from the clinic today. She has been instructed to continue with smoking cessation, continue monitoring glucose levels, offloading for an additional 4 weeks and continue with hydrofera blue for additional two weeks for any possible microscopic opening. Readmission: 08/07/18 on evaluation today patient presents for reevaluation concerning the ulcer of her right great toe. She was previously discharged on 06/28/18 healed. Nonetheless she states that this began to show signs of drainage she subsequently went to her primary care provider. Subsequently an x-ray was performed on 08/01/18 which was negative. The patient was also placed on antibiotics at that time. Fortunately they should have been effective for the infection. Nonetheless she's been experiencing some improvement but still has [name] lot of drainage coming from the wound itself. 08/14/18 on evaluation today patient's wound actually does show signs of improvement in regard to the erythema at this point. She has completed the antibiotics. With that being said we did discuss the possibility of placing her in [name] total contact cast as of today although I think that I may want to give this just [name] little bit more time to ensure nothing recurrence as far as her infection is concerned.  I do not want to put in the cast and risk infection at  that time if things are not completely resolved. With that being said she is gonna require some debridement today. 08/21/18 on evaluation today patient actually appears to be doing okay in regard to her toe ulcer. She's been tolerating the dressing changes without complication. With that being said it does appear that she is ready and in fact I think it's appropriate for [place] to go ahead and initiate the total contact cast today. [name], ZOMBEK [name] ([id number]) [id number] Page 14 of 21 Nonetheless she will require some sharp debridement to prepare the wound for application. Overall I feel like things have been progressing well but we do need to do something to get this to close more readily. 08/24/18 patient seen today for reevaluation after having had the total contact cast applied on Tuesday. She seems to have done very well the wound appears to be doing great and overall I'm pleased with the progress that she's made. There were no abnormal areas of rubbing from the cast on her lower extremity. 08/30/18 on evaluation today patient actually appears to be completely healed in regard to her plantar toe ulcer. She tells me at this point she's been having [name] lot of issues with the cast. She almost fell [name] couple of times the state shall the step of her dog [name] couple times as well. This is been [name] very frustrating process for her other nonetheless she has completely healed the wound which is excellent news. Overall there does not appear to be the evidence of infection at this time which is great news. 09/11/18 evaluation today patient presents for follow-up concerning her great toe ulcer on the left which has unfortunately reopened since I last saw her which was only [name] couple of weeks ago. Unfortunately she was not able to get in to get the shoe and potentially the AFO that's gonna be necessary due to her left foot drop. She continues with offloading shoe but this is not enough to  prevent her from reopening it appears. When we last had her in the total contact cast she did well from [name] healing standpoint but unfortunately the wound reopened as soon as she came out of the cast within just [name] couple of weeks. 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 This is also part of the reason why she falls on [name] regular basis. I just do not believe that is gonna be safe for [place] to reinitiate the total contact cast as last time we had this on she fell 3 times one week which is definitely not normal for her. 09/18/18 upon evaluation today the patient actually appears to be doing about the same in regard to her toe ulcer. She did not contact [employer] as I asked her to even though I had given her the prescription. In fact she actually states that she has no idea where the prescription is. She did apparently call [employer] and they told her that all she needed to do was bring the prescription in order to be able to be seen and work on getting the AFO for her left foot. With all that being said she still does not have an appointment and I'm not sure were things stand that  regard. I will give her [name] new prescription today in order to contact them to get this set up. 09/25/18 on evaluation today patient actually appears to be doing about the same in regard to her toes ulcer. She does have [name] small areas which seems to have [name] lot of callous buildup around the edge of the wound which is going to need sharp debridement today. She still is waiting to be scheduled for evaluation with [facility] for possibility of an AFO. She states there supposed to call her tomorrow to get this set up. Unfortunately it does appear that her foot specifically the toe area is showing signs of erythema. There does not appear to be any  systemic infection which is in these good news. 10/02/18 on evaluation today patient actually appears to be doing about the same in regard to her toe ulcer. This really has not done too well although it's not significantly larger it's also not significantly smaller. She has been tolerating the dressing changes without complication. She actually has her appointment with [facility] and [facility] tomorrow to hopefully be measured for obtaining and AFO splint. I think this would be helpful preventing this from reoccurring. We had contemplated starting the cast this week although to be honest I am reluctant to do that as she's been having nausea, vomiting, and seizure activity over the past three days. She has [name] history of seizures and have been told is nothing that can be done for these. With that being said I do believe that along with the seizures have the nausea vomiting which upon further questioning doesn't seem to be the normal for her and makes me concerned for the possibility of infection or something else going on. I discussed this with the patient and her mother during the office visit today. I do not feel the wound is effective but maybe something else. The responses this was "this just happens to her at times and we don't know why". They did not seem to be interested in going to the hospital to have this checked out further. 10/09/18 on evaluation today patient presents for follow-up concerning her ongoing toe ulcer. She has been tolerating the dressing changes without complication. Fortunately there does not appear to be any evidence of infection which is great news however I do think that the patient would benefit from going ahead for with the total contact cast. She's actually in [name] wheelchair today she tells me that she will use her walker if we initiate the cast. I was very specific about the fact that if we were gonna do the cast I wanted to make sure that she was using the walker in order to  prevent any falls. She tells me she does not have stairs that she has to traverse on [name] regular basis at her home. She has not had any seizures since last week again that something that happens to her often she tells me she did talk to [employer] and they said that it may take up to three weeks to get the brace approved for her. Hopefully that will not take that long but nonetheless in the meantime I do think the cast could be of benefit. 10/12/18 on evaluation today patient appears to be doing rather well in regard to her toe ulcer. It's just been [name] few days and already this is significantly improved both as far as overall appearance and size. Fortunately there's no sign of infection. She is here for her first obligatory cast change. 10/19/18 Seen  today for follow up and management of left great toe ulcer. Wound continues to show improvement. Noted small open area with seroussang drainage with palpation. Denies any increased pain or recent fevers during visit. She will continue calcium alginate with offloading shoe. Denies any questions or concerns during visit. 10/26/18 on evaluation today patient appears to be doing about the same as when I last saw her in regard to her wound bed. Fortunately there does not appear to be any signs of infection. Unfortunately she continues to have [name] breakdown in regard to the toe region any time that she is not in the cast. It takes almost no time at all for this to happen. Nonetheless she still has not heard anything from the brace being made by [employer] as to when exactly this will be available to her. Fortunately there is no signs of infection at this time. 10/30/18 on evaluation today patient presents for application of the total contact cast as we just received him this morning. Fortunately we are gonna be able to apply this to her today which is great news. She continues to have no significant pain which is good news. Overall I do feel like things have been  improving while she was the cast is when she doesn't have [name] cast that things get worse. She still has not really heard anything from [place] regarding her brace. 11/02/18 upon evaluation today patient's wound already appears to be doing significantly better which is good news. Fortunately there does not appear to be any signs of infection also good news. Overall I do think the total contact cast as before is helping to heal this area unfortunately it's just not gonna likely keep the area closed and healed without her getting her brace at least. Again the foot drop is [name] significant issue for her. 11/09/18 on evaluation today patient appears to be doing excellent in regard to her toe ulcer which in fact is completely healed. Fortunately we finally got the situation squared away with the paperwork which was needed to proceed with getting her brace approved by Medicaid. I have filled that out unfortunately that information has been sent to the orthopedic office that I worked at 2 1/2 years ago and not tired Current wound care measures. Fortunately she seems to be doing very well at this time. 11/23/18 on evaluation today patient appears to be doing More Poorly Compared to Last Time I Saw Her. At [facility] She Had Completely Healed. Currently she is continuing to have issues with reopening. She states that she just found out that the brace was approved through Medicaid now she just has to go get measured in order to have this fitted for her and then made. Subsequently she does not have an appointment for this yet that is going to complicate things we obviously cannot put her back in the cast if we do not have everything measured because they're not gonna be able to measure her foot while she is in the cast. 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 Obviously this is unfortunate and does have me somewhat  worried as well. 11/30/18 on evaluation today patient's toe ulcer actually appears to be doing fairly well. The good news is she will be getting her brace in the shoes next week on Wednesday. Hopefully we will be able to get this to heal without having to go back in  the cast however she may need the cast in order to get the wound completely heal and then go from there. Fortunately there's no signs of infection at this time. 12/07/18 on evaluation today patient fortunately did receive her brace and she states she could tell this definitely makes her walk better. With that being said she's been having issues with her toe where she noticed yesterday there was [name] lot of tissue that was loosing off this appears to be much larger than what it was previous. She also states that her leg has been read putting much across the top of her foot just about the ankle although this seems to be receiving somewhat. The total area is still red and appears to be someone infected as best I can tell. She is previously taken Bactrim and that may be [name] good option for her today as well. We are gonna see what I wound culture shows as well and I think that this is definitely appropriate. With that being said outside of the culture I still need to initiate something in the interim and that's what I'm gonna go ahead and select Bactrim is [name] good option for her. 12/14/18 on evaluation today patient appears to be doing better in regard to her left great toe ulcer as compared to last week's evaluation. There's still some Hou, Khalidah [name] ([id number]) [id number] Page 15 of 21 erythema although this is significantly improved which is excellent news. Overall I do believe that she is making good progress is still gonna take some time before she is where I would like her to be from the standpoint of being able to place her back into the total contact cast. Hopefully we will be where we need to be by next  week. 12/21/18 on evaluation today patient actually appears to be doing poorly in regard to her toe ulcer. She's been tolerating the dressing changes without complication. Fortunately there's no signs of systemic infection although she does have [name] lot of drainage from the toe ulcer and this does seem to be causing some issues at this point. She does have erythema on the distal portion of her toe that appears to be likely cellulitis. 12/28/18 on evaluation today patient actually appears to be doing [name] little better in my pinion in regard to her toe ulcer. With that being said she still does have some evidence of infection at this time and for her culture she had both E. coli as well as enterococcus as organisms noted on evaluation. For that reason I think that though the Keflex likely has treated the E. coli rather well this has really done nothing for the enterococcus. We are going to have to initiate treatment for this specifically. 01/04/19 on evaluation today patient's toe actually appears to be doing better from the standpoint of infection. She currently would like to see about putting the cash back on I think that this is appropriate as long as she takes care of it and keeps it from getting wet. She is gonna have some drainage we can definitely pass this up with Drawtex and alginate to try to prevent as much drainage as possible from causing the problems. With that being said I do want to at least try her with the cast between now and Tuesday. If there any issues we can't continue to use it then I will discontinue the use of the cast at that point. 01/08/19 on evaluation today patient actually appears to be doing very well as far as  her foot ulcer specifically the great toe on the left is concerned. She did have an area of rubbing on the medial aspect of her left ankle which again is from the cast. Fortunately there's no signs of infection at this point in this appears to be [name] very slight skin  breakdown. The patient tells me she felt it rubbing but didn't think it was that bad. Fortunately there is no signs of active infection at this time which is good news. No fevers, chills, nausea, or vomiting noted at this time. 01/15/19 on evaluation today patient actually appears to be doing well in regard to her toe ulcer. Again as previous she seems to do well and she has the cast on which indicates to me that during the time she doesn't have [name] cast on she's putting way too much pressure on this region. Obviously I think that's gonna be an issue as with the current national emergency concerning the Covid-19 Virus it has been recommended that we discontinue the use of total contact casting by the chief medical officer of our company, Dr. Simona Huh. The reasoning is that if [name] patient becomes sick and cannot come into have the cast removed they could not just leave this on for an additional two weeks. Obviously the hospitals also do not want to receive patient's who are sick into the emergency department to potentially contaminate the region and spread the Covid-19 Virus among other sick individuals within the hospital system. Therefore at this point we are suspending the use of total contact cast until the current emergency subsides. This was all discussed with the patient today as well. 01/22/19 on evaluation today patient's wound on her left great toe appears to be doing slightly worse than previously noted last week. She tells me that she has been on this quite [name] bit in fact she tells me she's been awake for 38 straight hours. This is due to the fact that she's having to care for grandparents because nobody else will. She has been taking care of them for five the last seven days since I've seen her they both have dementia his is from [name] stroke and her grandmother's was progressive. Nonetheless she states even her mom who knows her condition and situation has only help two of those days to take care  of them she's been taking care of the rest. Fortunately there does not appear to be any signs of active infection in regard to her toe at this point although obviously it doesn't look as good as it did previous. I think this is directly related to her not taking off the pressure and friction by way of taking things easy. Though I completely understand what's going on. 01/29/19 on evaluation today patient's tools are actually appears to be showing some signs of improvement today compared to last week's evaluation as far as not necessarily the overall size of the wound but the fact that she has some new skin growth in between the two ends of the wound opening. Overall I feel like she has done well she states that she had [name] family member give her what sounds to be [name] CAM walker boot which has been helpful as well. 02/05/19 on evaluation today patient's wound bed actually appears to be doing significantly better in regard to her overall appearance of the size of the wound. With that being said she is still having an issue with offloading efficiently enough to get this to close. Apparently there is some signs of  infection at this point as well unfortunately. Previously she's done well of Augmentin I really do not see anything that needs to be culture currently but there theme and cellulitis of the foot that I'm seeing I'm gonna go ahead and place her on an antibiotic today to try to help clear this up. 02/12/2019 on evaluation today patient actually appears to be doing poorly in regard to her overall wound status. She tells me she has been using her offloading shoe but actually comes in today wearing her tennis shoe with the AFO brace. Again as I previously discussed with her this is really not sufficient to allow the area to heal appropriately. Nonetheless she continues to be somewhat noncompliant and I do wonder based on what she has told my nurse in the past as to whether or not [name] good portion of this  noncompliance may be recreational drug and alcohol related. She has had [name] history of heroin overdose and this was fairly recently in the past couple of months that have been seeing her. Nonetheless overall I feel like her wound looks significantly worse today compared to what it was previous. She still has significant erythema despite the Augmentin I am not sure that this is an appropriate medication for her infection I am also concerned that the infection may have gone down into her bone. 02/19/19 on evaluation today patient actually appears to be doing about the same in regard to her toe ulcer. Unfortunately she continues to show signs of bone exposure and infection at this point. There does not appear to be any evidence of worsening of the infection but I'm also not really sure that it's getting significantly better. She is on the Augmentin which should be sufficient for the Staphylococcus aureus infection that she has at this point. With that being said she may need IV antibiotics to more appropriately treat this. We did have [name] discussion today about hyperbaric option therapy. 02/28/19 on evaluation today patient actually appears to be doing much worse in regard to the wound on her left great toe as compared to even my previous evaluation last week. Unfortunately this seems to be training in [name] pretty poor direction. Her toe was actually now starting to angle laterally and I can actually see the entire joint area of the proximal portion of the digit where is the distal portion of the digit again is no longer even in contact with the joint line. Unfortunately there's [name] lot more necrotic tissue around the edge and the toe appears to be showing signs of becoming gangrenous in my pinion. I'm very concerned about were things stand at this point. She did see infectious disease and they are planning to send in [name] prescription for Sivextro for her and apparently this has been approved. With that being said I  don't think she should avoid taking this but at the same time I'm not sure that it's gonna be sufficient to save her toe at this point. She tells me that she still having to care for grandparents which I think is putting quite [name] bit of strain on her foot and specifically the total area and has caused this to break down even to [name] greater degree than would've otherwise been expected. 03/05/19 on evaluation today patient actually appears to be doing quite well in regard to her toe all things considering. She still has bone exposed but there appears to be much less your thing on overall the appearance of the wound and the toe itself  is dramatically improved. She still does have some issues currently obviously with infection she did see vascular as well and there concerned that her blood flow to the toad. For that reason they are setting up for an angiogram next week. 03/14/19 on evaluation today patient appears to be doing very poor in regard to her toe and specifically in regard to the ulceration and the fact that she's starting to notice the toe was leaning even more towards the lateral aspect and the complete joint is visible on the proximal aspect of the joint. Nonetheless she's also noted [name] significant odor and the tip of the toe is turning more dark and necrotic appearing. Overall I think she is getting worse not better as far as this is concerned. For that reason I am recommending at this point that she likely needs to be seen for likely amputation. READMISSION 03/19/2021 This is [name] patient that we cared for in this clinic for [name] prolonged period of time in 2019 and 2020 with [name] left foot and left first toe wound. 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 Luana Shu. In December 2021 she had an ulcer on her right great toe as well as the fourth and fifth toes. She underwent [name] partial ray  amputation of the right fourth and fifth toes. She also had an angiogram at that time and underwent angioplasty of the right anterior tibial artery. In any case she claims that the wound on the right foot is closed I did not look at this today which was probably an oversight although I think that should be done next week. 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 According to Dr. Deborra Medina last review that she was out of the area being cared for by another physician but recently came back to his attention. 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 [name] culture of this area showed E. coli apparently before she came back to see Dr. Luana Shu she was supposed to be receiving antibiotics but she did not really take them. Nor is she offloading this area at all. Finally her last hemoglobin A1c listed in epic was in March 2022 at 14.1 she says things are [name] lot better since then although I am not sure. [name], [name] ([id number]) [id number] Page 16 of 21 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 She had this wound at the time. During this admission she had E. coli sepsis [name] TEE was negative. 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 She is still [name] heavy cigarette smoker. She has [name] history of bipolar disorder seizure disorder ABI in our clinic was 1.05 6/1; left midfoot in the setting of [name] TMA done previously. Round circular wound with [name] "knuckle" of protruding tissue. The problem is that the knuckle was not attached to any of the surrounding granulation and this probed proximally widely I  removed [name] large portion of this tissue. This wound goes with considerable undermining laterally. I do not feel any bone there was no purulence but this is [name] deep wound. 6/8; in  spite of the debridement I did last week. She arrives with [name] wound looking exactly the same. [name] protruding "knuckle" of tissue nonadherent to most of the surrounding tissue. There is considerable depth around this from 6-12 o'clock at 2.7 cm and undermining of 1 cm. This does not look overtly infected and the x- ray I did last week was negative for any osseous abnormalities. We have been using silver collagen 6/15; deep tissue culture I did last week showed moderate staph aureus and moderate Pseudomonas. This will definitely require prolonged antibiotic therapy. The pathology on the protuberant area was negative for malignancy fungus etc. 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 We have been using silver collagen. I am going to be prescribing Levaquin for 2 weeks. Her CT scan of the foot is down for 7/5 6/22; CT scan of the foot on 7 5. She says she has hardware in the left leg from her previous fracture. She is on the Levaquin for the deep tissue culture I did that showed methicillin sensitive staph aureus and Pseudomonas. I gave her [name] 2-week supply and she will have another week. She arrives in clinic today with the same protuberant tissue however this is nonadherent to the tissue surrounding it. I am really at [name] loss to explain this unless there is underlying deep tissue infection 6/29; patient presents for 1 week follow-up. She has been using collagen to the wound bed. She reports taking her antibiotics as prescribed.She has no complaints or issues today. She denies signs of infection. 7/6; patient presents for one week followup. She has been using collagen to the wound bed. She states she is taking Levaquin however at times she is not able to keep it down. She  denies signs of infection. 7/13; patient presents for 1 week follow-up. She has been using silver alginate to the wound bed. She still has nausea when taking her antibiotics. She denies signs of infection. 7/20; patient presents for 1 week follow-up. She has been using silver alginate with gentamicin cream to the wound bed. She denies any issues and has no complaints today. She denies signs of infection. 7/27; patient presents for 1 week follow-up. She continues to use silver alginate with gentamicin cream to the wound bed. She reports starting her antibiotics. She has no issues or complaints. Overall she reports stability to the wound. 8/3; patient presents for 1 week follow-up. She has been using silver alginate with gentamicin cream to the wound bed. She reports completing all antibiotics. She has no issues or complaints today. She denies signs of infection. 8/17; patient presents for 2-week follow-up. He is to use silver alginate to the wound bed. She has no issues or complaints today. She denies signs of infection. She reports her pain has improved in her foot since last clinic visit 8/24; patient presents for 1 week follow-up. She continues to use silver alginate to the wound bed. She has no issues or complaints. She denies signs of infection. Pain is stable. 9/7; patient presents for follow-up. She missed her last week appointment due to feeling ill. She continues to use silver alginate. She has [name] new wound to the right lower extremity that is covered in eschar. She states It occurred over the past week and has no idea how it started. She currently denies signs of infection. 9/14; patient presents for follow-up. T the left foot wound she has been using gentamicin cream and silver alginate. T the right lower extremity  wound she has o o been keeping this covered and has not obtain Santyl. 9/21; patient presents for follow-up. She reports using gentamicin cream and silver alginate to the left  foot and Santyl to the right lower extremity wound. She has no issues or complaints today. She denies signs of infection. 9/28; patient presents for follow-up. She reports [name] new wound to her right heel. She states this occurred [name] few days ago and is progressively gotten worse. She has been trying to clean the area with [name] Q-tip and Santyl. She reports stability in the other 2 wounds. She has been using gentamicin cream and silver alginate to the left foot and Santyl to the right lower extremity wound. 10/12; patient presents for follow-up. She reports improvement to the wound beds. She is seeing vein and vascular to discuss the potential of [name] left BKA. She states they are going to do an arteriogram. She continues to use silver alginate with dressing changes to her wounds. 11/2; patient presents for follow-up. She states she has not been doing dressing changes to the wound beds. She states she is not able to offload the areas. She reports chronic pain to her left foot wound. 11/9; patient presents for follow-up. She came in with only socks on. She states she forgot to put on shoes. It is unclear if she is doing any dressing changes. She currently denies systemic signs of infection. 11/16; patient presents for follow-up. She came again only with socks on. She states she does not wear shoes ever. It is unclear if she does dressing changes. She currently denies systemic signs of infection. 11/23; patient presents for follow-up. She wore her shoes today. It still unclear exactly what dressing she is using for each wound but she did states she obtained Dakin's solution and has been using this to the left foot wound. She currently denies signs of infection. 11/30; patient presents for follow-up. She has no issues or complaints today. She currently denies signs of infection. 12/7; patient presents for follow-up. She has no issues or complaints today. She has been using Hydrofera Blue to the right heel wound  and Dakin solution to the left foot wound. Her right anterior leg wound is healed. She currently denies signs of infection. 12/14; patient presents for follow-up. She has been using Hydrofera Blue to the right heel and Dakin's to the left foot wounds. She has no issues or complaints today. She denies signs of infection. 12/21; patient presents for follow-up. She reports using Hydrofera Blue to the right heel and Dakin's to the left foot wound. She denies signs of infection. 12/28; patient presents for follow-up. She continues to use Dakin's to the left foot wound and Hydrofera Blue to the right heel wound. She denies signs of infection. 1/4; patient presents for follow-up. She has no issues or complaints today. She denies signs of infection. 1/11; patient presents for follow-up. It is unclear if she has been dressing these wounds over the past week. She currently denies signs of infection. 1/18; patient presents for follow-up. She states she has been using Dakin's wet-to-dry dressings to the left foot. She has been using Hydrofera Blue to the right foot foot wound. She states that the anterior right leg wound has reopened and draining serous fluid. She denies signs of infection. 1/25; patient presents for follow-up. She has no issues or complaints today. 2/1; patient presents for follow-up. She has no issues or complaints today. She denies signs of infection. 2/8; patient presents for follow-up. She  has lost her surgical shoes. She did not have [name] dressing to the right heel wound. She currently denies signs of infection. [name], [name] ([id number]) [id number] Page 17 of 21 2/15; patient presents for follow-up. She reports more pain to the right heel today. She denies purulent drainage Or fever/chills 2/22; patient presents for follow-up. She reports taking clindamycin over the past week. She states that she continues to have pain to her right heel. She reports purulent  drainage. Readmission 03/16/2022 Ms. Sairy Lewi is [name] 47 year old female with [name] past medical history of type 2 diabetes, osteomyelitis to her feet, chronic systolic heart failure and bipolar disorder that presents to the clinic for bilateral feet wounds and right lower extremity wound. She was last seen in our clinic on 12/15/2021. At that time she had purulent drainage coming out of her right plantar foot and I recommended she go to the ED. She states she went to [facility] and has been there for the past 3 months. I cannot see the records. She states she had OR debridement and was on several weeks of IV antibiotics while inpatient. Since discharge she has not been taking care of the wound beds. She had nothing on her feet other than socks today. She currently denies signs of infection. 5/31; patient presents for follow-up. She has been using Dakin's wet-to-dry dressings to the wound beds on her feet bilaterally and antibiotic ointment to the right anterior leg wound. She had [name] wound culture done at last clinic visit that showed moderate Pseudomonas aeruginosa sensitive to ciprofloxacin. She currently denies systemic signs of infection. 6/14; patient presents for follow-up. She received Keystone 5 days ago and has been using this on the wound beds. She states that last week she had to go to the hospital because she had increased warmth and erythema to the right foot. She was started on 2 oral antibiotics. She states she has been taking these. She currently denies systemic signs of infection. She has no issues or complaints today. 6/21; patient presents for follow-up. She states she has been using Keystone antibiotics to the wound beds. She has no issues or complaints today. She denies signs of infection. 6/28; patient presents for follow-up. She has been using Keystone antibiotics to the wound beds. She has no issues or complaints today. 7/12; patient presents for follow-up. Has been  using Keystone antibiotics to the wound beds with calcium alginate. She has no issues or complaints today. She never followed up with her orthopedic surgeon who did the OR debridement to the right foot. We discussed the total contact cast for the left foot and patient would like to do this next week. 7/19; patient presents for follow-up. She has been using Keystone antibiotics with calcium alginate to the wound beds. She has no issues or complaints today. Patient is in agreement to do the total contact cast of the left foot today. She knows to return later this week for the obligatory cast change. 05-13-2022 upon evaluation today patient's wound which she has the cast of the left leg actually appears to be doing significantly better. Fortunately I do not see any signs of active infection locally or systemically which is great news and overall I am extremely pleased with where we stand currently. 7/26; patient presents for follow-up. She has [name] cast in place for the past week. She states it irritated her shin. Other than that she tolerated the cast well. She states she would like [name] break for 1 week from  the cast. We have been using Keystone antibiotic and Aquacel to both wound beds. She denies signs of infection. 8/2; patient presents for follow-up. She has been using Keystone and Aquacel to the wound beds. She denies any issues and has no complaints. She is agreeable to have the cast placed today for the left leg. 06-03-2022 upon evaluation today patient appears to be doing well with regard to her wound she saw some signs of improvement which is great news. Fortunately I do not see any evidence of active infection locally or systemically at this time which is great news. No fevers, chills, nausea, vomiting, or diarrhea. 8/16; patient presents for follow-up. She has no issues or complaints today. We have been using Keystone and Aquacel to the wound beds. The left lower extremity is in [name] total contact  cast. She is tolerated this well. 8/23; patient presents for follow-up. She has had the total contact cast on the left leg for the past week. Unfortunately this has rubbed and broken down the skin to the medial foot. She currently denies signs of infection. She has been using Keystone antibiotic to the right plantar foot wound. 8/30; patient presents for follow-up. We have held off on the total contact cast for the left leg for the past week. Her wound on the left foot has improved and the previous surrounding breakdown of skin has epithelialized. She has been using Keystone antibiotic to both wound beds. She has no issues or complaints today. She denies signs of infection. 9/6; patient presents for follow-up. She has ordered her's Keystone antibiotic and this is arriving this week. She has been doing Dakin's wet-to-dry dressings to the wound beds. She denies signs of infection. She is agreeable to the total contact cast today. 9/13; patient presents for follow-up. She states that the cast caused her left leg shin to be sore. She would like to take [name] break from the cast this week. She has been using Keystone antibiotic to the right plantar foot wound. She denies signs of infection. 9/20; patient presents for follow-up. She has been using Keystone antibiotics to the wound beds with calcium alginate to the right foot wound and Hydrofera Blue to the left foot wound. She is agreeable to having the cast placed today. She has been approved for Apligraf and we will order this for next clinic visit. 9/27; patient presents for follow-up. We have been using Keystone antibiotic with Hydrofera Blue to the left foot wound under [name] total contact cast. T the right o foot wound she has been using Keystone antibiotic and calcium alginate. She declines [name] total contact cast today. Apligraf is available for placement and she would like to proceed with this. 07-28-2022 upon evaluation today patient appears to be doing well  currently in regard to her wound. She is actually showing signs of significant improvement which is great news. Fortunately I do not see any evidence of active infection locally nor systemically at this time. She has been seeing Dr. Heber [place] and to be honest has been doing very well with the cast. Subsequently she comes in today with [name] cast on and we did reapply that today as well. She did not really want to she try to talk me out of that but I explained that if she wanted to heal this is really the right way to go. Patient voiced understanding. In regard to her right foot this is actually [name] lot better compared to the last time I saw her which is also great  news. 10/11; patient presents for follow-up. Apligraf and the total contact cast was placed to the left leg at last clinic visit. She states that her right foot wound had burning pain to it with the placement of Apligraf to this area. She has been doing Gerty over this area. She denies signs of infection including increased warmth, erythema or purulent drainage. 11/1; 3-week follow-up. The patient fortunately did not have [name] total contact cast or an Apligraf and on the left foot. She has been using Keystone ABD pads and kerlix and her own running shoes She arrives in clinic today with thick callus and [name] very poor surface on the left foot on the right nonviable skin subcutaneous tissue and [name] deep probing hole. 11/15; patient missed her last clinic appointment. She states she has not been dressing the wound beds for the past 2 weeks. She states that at she had [name] new roommate but is now going back to live with her mother. Apparently its been [name] distracting 2 weeks. Patient currently denies signs of infection. 11/22; patient presents for follow-up. She states she has been using Keystone antibiotic and Dakin's wet-to-dry dressings to the wound beds. She is agreeable for cast placement today. We had ordered Apligraf however this has not been received by  our facility. 11/29; Patient had [name] total contact cast placed at last clinic visit and she tolerated this well. We were using silver alginate under the cast. Patient's been using Gapinski, Alvis [name] ([id number]) [phone].pdf Page 18 of 21 Keystone antibiotic with Aquacel to the right plantar foot wound. She has no issues or complaints today. Apligraf is available for placement today. Patient would like to proceed with this. 12/6; patient presents for follow-up. She had Apligraf placed in standard fashion last clinic visit under the total contact cast to the left lower extremity. She has been using Keystone antibiotic and Aquacel to the right plantar foot wound. She has no issues or complaints today. 12/13; patient presents for follow-up. She has finished 5 Apligraf placements. Was told she would not qualify for more. We have been doing [name] total contact cast to the left lower extremity. She has been using Keystone antibiotic and Aquacel to the right plantar foot wound. She has no issues or complaints today. 12/20; patient presents for follow-up. We have been using Hydrofera Blue with Keystone antibiotic under [name] total contact cast of the left lower extremity. She reports using Keystone antibiotic and silver alginate to the right heel wound. She has no issues or complaints today. 12/27; patient presents with [name] healthy wound on the left midfoot. We have Apligraf to apply that to that more also using [name] total contact cast. On the right we are using Keystone and silver alginate. She is offloading the right heel with [name] surgical shoealthough by her admission she is on her feet quite [name] bit 1/3; patient presents for follow-up. Apligraf was placed to the wound beds last clinic visit. She was placed in [name] total contact cast to the left lower extremity. She declines [name] total contact cast today. She states that her mother is in the hospital and she cannot adequately get around with the cast  on. 1/10; patient presents for follow-up. She declined the total contact cast at last clinic visit. Both wounds have declined in appearance. She states that she has been on her feet and not offloading the wound beds. She currently denies signs of infection. 1/17; patient presents for follow-up. She had the total contact cast along with Apligraf  placed last week to the left lower extremity. She tolerated this well. She has been using Aquacel Ag and Keystone antibiotic to the right heel wound. She currently denies signs of infection. She has no issues or complaints today. 1/24; patient presents for follow-up. We have been using Apligraf to the left foot wound along with [name] total contact cast. She has done well with this. T the right o heel wound she has been using Aquacel Ag and Keystone antibiotic ointment. She has no issues or complaints today. She denies signs of infection. 1/31; patient presents for follow-up. We have been using Apligraf to the left foot wound along with the total contact cast. She continues to do well with this. To the right heel we have been using Aquacel Ag and Keystone antibiotic ointment. She has no issues or complaints today. 12-01-2022 upon evaluation patient is seen today on my schedule due to the fact that she unfortunately was in the hospital yesterday. Her cast needed to come off the only reason she is out of the hospital is due to the fact that they would not take it off at the hospital which is somewhat bewildered reading to me to be perfectly honest. I am not certain why this was but either way she was released and then was placed on my schedule today in order to get this off and reapply the total contact casting as appropriate. I do not have an Apligraf for her today it was applied last week and today's actually expired yesterday as there was some scheduling conflicts with her being in the hospital. Nonetheless we do not have that for reapplication today but the good news  that she is not draining too much and the Apligraf can go for up to 2 weeks so I am going to go ahead and reapply the total contact casting but we are going to leave the Apligraf in place. 2/14; patient presents for follow-up. T the left leg she has had the total contact cast and Apligraf for the past week. She has had no issues with this. T the o o right heel she has been using antibiotic ointment and Aquacel Ag. Objective Constitutional Vitals Time Taken: 11:35 AM, Height: 69 in, Weight: 178 lbs, BMI: 26.3, Temperature: 98.5 F, Pulse: 67 bpm, Respiratory Rate: 16 breaths/min, Blood Pressure: 132/72 mmHg. General Notes: Right foot: T the plantar heel there is an incision site with increased depth, No probing to bone. T the opening there is Nonviable tissue and o o granulation tissue. Left foot: T the medial aspect there is an open wound with granulation tissue and nonviable tissue. No overt signs of infection to any of the o wound beds. Integumentary (Hair, Skin) Wound #11 status is Open. Original cause of wound was Surgical Injury. The date acquired was: 12/01/2021. The wound has been in treatment 38 weeks. The wound is located on the [place]. The wound measures 0.8cm length x 0.6cm width x 0.5cm depth; 0.377cm^2 area and 0.188cm^3 volume. There is Fat Layer (Subcutaneous Tissue) exposed. There is [name] medium amount of serosanguineous drainage noted. The wound margin is distinct with the outline attached to the wound base. There is medium (34-66%) pale granulation within the wound bed. There is [name] medium (34-66%) amount of necrotic tissue within the wound bed. Wound #12 status is Open. Original cause of wound was Pressure Injury. The date acquired was: 03/16/2020. The wound has been in treatment 38 weeks. The wound is located on the [place]. The wound measures  1.5cm length x 0.7cm width x 0.2cm depth; 0.825cm^2 area and 0.165cm^3 volume. There is Fat Layer (Subcutaneous  Tissue) exposed. There is [name] medium amount of serous drainage noted. The wound margin is flat and intact. There is small (1- 33%) red, pink granulation within the wound bed. There is [name] large (67-100%) amount of necrotic tissue within the wound bed including Adherent Slough. 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 I debrided nonviable tissue. Apligraf was placed in standard fashion to the left plantar foot wound. The total contact cast was replaced to the left lower extremity. T the right plantar foot I recommended continuing Keystone antibiotic ointment and Aquacel Ag. Follow-up in 1 o week. Procedures Wound #11 Pre-procedure diagnosis of Wound #11 is an Open Surgical Wound located on the Right,Plantar Foot . There was [name] Excisional Skin/Subcutaneous Tissue Debridement with [name] total area of 0.48 sq cm performed by Kalman Shan, MD. With the following instrument(s): Curette Material removed includes Callus, Subcutaneous Tissue, and Slough. [name] time out was conducted at 11:47, prior to the start of the procedure. [name] Minimum amount of bleeding was controlled with Pressure. The procedure was tolerated well. Post Debridement Measurements: 0.8cm length x 0.6cm width x 0.5cm depth; 0.188cm^3 volume. Character of Wound/Ulcer Post Debridement is stable. Post procedure Diagnosis Wound #11: Same as Pre-Procedure Wound #12 Pre-procedure diagnosis of Wound #12 is [name] Diabetic Wound/Ulcer of the Lower Extremity located on the Left,Medial,Plantar Foot  .Severity of Tissue Pre Debridement is: Fat layer exposed. There was [name] Excisional Skin/Subcutaneous Tissue Debridement with [name] total area of 1.05 sq cm performed by Kalman Shan, MD. With the following instrument(s): Curette Material removed includes Callus, Subcutaneous Tissue, and Slough. [name] time out was conducted at 11:47, prior to the start of the procedure. [name] Minimum amount of bleeding was controlled with Pressure. The procedure was tolerated well. Post Debridement Measurements: 1.5cm length x 0.7cm width x 0.2cm depth; 0.165cm^3 volume. Character of Wound/Ulcer Post Debridement is stable. Severity of Tissue Post Debridement is: Fat layer exposed. Post procedure Diagnosis Wound #12: Same as Pre-Procedure Pre-procedure diagnosis of Wound #12 is [name] Diabetic Wound/Ulcer of the Lower Extremity located on the Left,Medial,Plantar Foot. [name] skin graft procedure using [name] bioengineered skin substitute/cellular or tissue based product was performed by Kalman Shan, MD with the following instrument(s): Blade and Scissors. Apligraf was applied and secured with Steri-Strips. 33 sq cm of product was utilized and 11 sq cm was wasted due to wound size. Post Application, Bolster was applied. [name] Time Out was conducted at 11:49, prior to the start of the procedure. The procedure was tolerated well. Post procedure Diagnosis Wound #12: Same as Pre-Procedure . Pre-procedure diagnosis of Wound #12 is [name] Diabetic Wound/Ulcer of the Lower Extremity located on the Left,Medial,Plantar Foot . There was [name] T Contact otal Cast Procedure by Kalman Shan, MD. Post procedure Diagnosis Wound #12: Same as Pre-Procedure Plan Follow-up Appointments: Return Appointment in 1 week. Nurse Visit as needed Bathing/ Shower/ Hygiene: May shower with wound dressing protected with water repellent cover or cast protector. No tub bath. Anesthetic (Use 'Patient Medications' Section for Anesthetic Order Entry): Lidocaine  applied to  wound bed Cellular or Tissue Based Products: Cellular or Tissue Based Product Type: - Apligraf applied remained in place from last application Cellular or Tissue Based Product applied to wound bed; including contact layer, fixation with steri-strips, dry gauze and cover dressing. (DO NOT REMOVE). Edema Control - Lymphedema / Segmental Compressive Device / Other: Elevate, Exercise Daily and Avoid Standing for Long Periods of Time. Elevate legs to the level of the heart and pump ankles as often as possible Elevate leg(s) parallel to the floor when sitting. Off-Loading: T Contact Cast to Left Lower Extremity otal Open toe surgical shoe - Heel off loader - Right foot Other: - keep pressure off of feet. Additional Orders / Instructions: Follow Nutritious Diet and Increase Protein Intake Other: - Stay off of foot, wheelchair only Medications-Please add to medication list.: Wound #11 Right,Plantar Foot: Keystone Compound - use at home Topical Antibiotic - apply Gentamycin in office only WOUND #11: - Foot Wound Laterality: Plantar, Right Cleanser: Wound Cleanser 1 x Per Day/30 Days Discharge Instructions: Wash your hands with soap and water. Remove old dressing, discard into plastic bag and place into trash. Cleanse the wound with Wound Cleanser prior to applying [name] clean dressing using gauze sponges, not tissues or cotton balls. Do not scrub or use excessive force. Pat dry using gauze sponges, not tissue or cotton balls. Topical: Gentamicin 1 x Per Day/30 Days Discharge Instructions: Apply as directed by provider. in clinic Topical: keystone gel 1 x Per Day/30 Days Discharge Instructions: at home Prim Dressing: Aquacel Extra Hydrofiber Dressing, 2x2 (in/in) 1 x Per Day/30 Days ary Secondary Dressing: ABD Pad 5x9 (in/in) (Generic) 1 x Per Day/30 Days Discharge Instructions: Cover with ABD pad Secured With: Medipore T - 38M Medipore H Soft Cloth Surgical T ape ape, 2x2 (in/yd) 1 x Per Day/30  Days Secured With: [employer] Sterile or Non-Sterile 6-ply 4.5x4 (yd/yd) 1 x Per Day/30 Days Discharge Instructions: Apply Kerlix as directed WOUND #12: - Foot Wound Laterality: Plantar, Left, Medial Cleanser: Wound Cleanser 1 x Per Day/30 Days Discharge Instructions: Wash your hands with soap and water. Remove old dressing, discard into plastic bag and place into trash. Cleanse the wound with Wound Cleanser prior to applying [name] clean dressing using gauze sponges, not tissues or cotton balls. Do not scrub or use excessive force. Pat dry TESLYN, DELAHOZ [name] ([id number]) [id number] Page 20 of 21 using gauze sponges, not tissue or cotton balls. Topical: Gentamicin 1 x Per Day/30 Days Discharge Instructions: Apply as directed by provider. in clinic Secondary Dressing: ABD Pad 5x9 (in/in) (Generic) 1 x Per Day/30 Days Discharge Instructions: Cover with ABD pad Secured With: Medipore T - 38M Medipore H Soft Cloth Surgical T ape ape, 2x2 (in/yd) 1 x Per Day/30 Days Secured With: Kerlix Roll Sterile or Non-Sterile 6-ply 4.5x4 (yd/yd) 1 x Per Day/30 Days Discharge Instructions: Apply Kerlix as directed 1. In office sharp debridement 2. Apligraf placed in standard fashion to the left plantar foot wound 3. Keystone antibiotic ointment and silver alginate 4. Aggressive offloadingoototal contact cast placed in standard fashion to the left lower extremity, continue left offloading heel shoe for the right foot Electronic Signature(s) Signed: 12/07/2022 3:27:54 PM By: Kalman Shan DO Entered By: Kalman Shan on 12/07/2022 13:19:52 -------------------------------------------------------------------------------- Total Contact Cast Details Patient Name: Date of Service: LYLA, SKORA 12/07/2022 11:00 [name]: [id number] Patient Account Number: [id number] Date of Birth/Sex: Treating RN: [birth year] (47 y.o. [name] Primary Care Provider:  Raelene Bott Other Clinician: Massie Kluver Referring Provider: Treating Provider/Extender: Eddie North in Treatment: 73 T Contact Cast Applied for Wound Assessment: otal Wound #12 Left,Medial,Plantar Foot Performed By: Physician Kalman Shan, MD Post Procedure Diagnosis Same as Pre-procedure Electronic Signature(s) Signed: 12/07/2022 12:00:48 PM By: Kalman Shan DO Signed: 12/08/2022 11:57:56 AM By: Massie Kluver Entered By: Massie Kluver on 12/07/2022 11:49:00 -------------------------------------------------------------------------------- SuperBill Details Patient Name: Date of Service: [name] 12/07/2022 Medical Record Number: [id number] Patient Account Number: [id number] Date of Birth/Sex: Treating RN: 02-Jun-1976 (46 y.o. [name] Primary Care Provider: Raelene Bott Other Clinician: Massie Kluver Referring Provider: Treating Provider/Extender: Eddie North in Treatment: [address] [place] ([id number]) [id number] Page 21 of 21 Diagnosis Coding ICD-10 Codes Code Description 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ity 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.621 Type 2 diabetes mellitus with foot ulcer E11.42 Type 2 diabetes mellitus with diabetic polyneuropathy L97.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 of skin Facility Procedures : CPT4 Code: [id number] Description: [id number] (Facility Use Only) Apligraf 44 SQ CM Modifier: Quantity: 44 : CPT4 Code: [id number] Description: [id number] - SKIN SUB GRAFT FACE/NK/HF/G ICD-10 Diagnosis Description 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 E11.621 Type 2 diabetes mellitus with foot ulcer Modifier: ity Quantity: 1 : CPT4 Code: [id number] Description:  [id number] - DEB SUBQ TISSUE 20 SQ CM/< ICD-10 Diagnosis Description 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.621 Type 2 diabetes mellitus with foot ulcer Modifier: Quantity: 1 Physician Procedures : CPT4 Code Description Modifier [id number] - WC PHYS SKIN SUB GRAFT FACE/NK/HF/G ICD-10 Diagnosis Description 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ity E11.621 Type 2 diabetes mellitus with foot ulcer Quantity: 1 : [id number] - WC PHYS SUBQ TISS 20 SQ CM ICD-10 Diagnosis Description 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.621 Type 2 diabetes mellitus with foot ulcer Quantity: 1 Electronic Signature(s) Signed: 12/07/2022 3:27:54 PM By: Kalman Shan DO Entered By: Kalman Shan on 12/07/2022 13:20:59

## 2022-12-10 NOTE — Progress Notes (Signed)
[name], [name] ([id number]) [id number] Page 1 of 11 Visit Report for 12/07/2022 Arrival Information Details Patient Name: Date of Service: [name], [name] 12/07/2022 11:00 [name]: [id number] Patient Account Number: [id number] Date of Birth/Sex: Treating RN: [birth year] (47 y.o. [name] Primary Care Zakiyah Diop: Raelene Bott Other Clinician: Massie Kluver Referring Rhys Lichty: Treating Edwardine Deschepper/Extender: Eddie North in Treatment: 99 Visit Information History Since Last Visit All ordered tests and consults were completed: No Patient Arrived: Wheel Chair Added or deleted any medications: No Arrival Time: 11:20 Any new allergies or adverse reactions: No Transfer Assistance: None Had [name] fall or experienced change in No Patient Identification Verified: Yes activities of daily living that may affect Secondary Verification Process Completed: Yes risk of falls: Patient Requires Transmission-Based Precautions: No Signs or symptoms of abuse/neglect since last No Patient Has Alerts: No visito Hospitalized since last visit: No Implantable device outside of the clinic No excluding cellular tissue based products placed in the center since last visit: Has Dressing in Place as Prescribed: Yes Has Footwear/Offloading in Place as Yes Prescribed: Left: Surgical Shoe with Pressure Relief Insole T Contact Cast otal Pain Present Now: No Electronic Signature(s) Signed: 12/08/2022 11:57:56 AM By: Massie Kluver Entered By: Massie Kluver on 12/07/2022 11:25:29 -------------------------------------------------------------------------------- Clinic Level of Care Assessment Details Patient Name: Date of Service: [name] 12/07/2022 11:00 [name]: [id number] Patient Account Number: [id number] Date of Birth/Sex: Treating RN: 1976-04-16 (47 y.o. [name] Primary Care Shilo Pauwels: Raelene Bott Other Clinician: Massie Kluver Referring Raelle Chambers: Treating Eoin Willden/Extender: Eddie North in Treatment: [facility] Level of Care Assessment Items [name] [name] ([id number]) [id number] Page 2 of 11 TOOL 1 Quantity Score []$  - 0 Use when EandM and Procedure is performed on INITIAL visit ASSESSMENTS - Nursing Assessment / Reassessment []$  - 0 General Physical Exam (combine w/ comprehensive assessment (listed just below) when performed on new pt. evals) []$  - 0 Comprehensive Assessment (HX, ROS, Risk Assessments, Wounds Hx, etc.) ASSESSMENTS - Wound and Skin Assessment / Reassessment []$  - 0 Dermatologic / Skin Assessment (not related to wound area) ASSESSMENTS - Ostomy and/or Continence Assessment and Care []$  - 0 Incontinence Assessment and Management []$  - 0 Ostomy Care Assessment and Management (repouching, etc.) PROCESS - Coordination of Care []$  - 0 Simple Patient / Family Education for ongoing care []$  - 0 Complex (extensive) Patient / Family Education for ongoing care []$  - 0 Staff obtains Programmer, systems, Records, T Results / Process Orders est []$  - 0 Staff telephones HHA, Nursing Homes / Clarify orders / etc []$  - 0 Routine Transfer to another Facility (non-emergent condition) []$  - 0 Routine Hospital Admission (non-emergent condition) []$  - 0 New Admissions / Biomedical engineer / Ordering NPWT Apligraf, etc. , []$  - 0 Emergency Hospital Admission (emergent condition) PROCESS - Special Needs []$  - 0 Pediatric / Minor Patient Management []$  - 0 Isolation Patient Management []$  - 0 Hearing / Language / Visual special needs []$  - 0 Assessment of Community assistance (transportation, D/C planning, etc.) []$  - 0 Additional assistance / Altered mentation []$  - 0 Support Surface(s) Assessment (bed, cushion, seat, etc.) INTERVENTIONS - Miscellaneous []$  - 0 External ear exam []$  - 0 Patient Transfer (multiple staff /  Civil Service fast streamer / Similar devices) []$  - 0 Simple Staple / Suture removal (25 or less) []$  - 0 Complex Staple / Suture removal (26 or more) []$  - 0 Hypo/Hyperglycemic Management (do not check if  billed separately) []$  - 0 Ankle / Brachial Index (ABI) - do not check if billed separately Has the patient been seen at the hospital within the last three years: Yes Total Score: 0 Level Of Care: ____ Electronic Signature(s) Signed: 12/08/2022 11:57:56 AM By: Massie Kluver Entered By: Massie Kluver on 12/07/2022 12:13:46 [name] ([id number] Page 3 of 11 -------------------------------------------------------------------------------- Encounter Discharge Information Details Patient Name: Date of Service: [name], [name] 12/07/2022 11:00 [name]: [id number] Patient Account Number: [id number] Date of Birth/Sex: Treating RN: 1976-10-15 (47 y.o. [name] Primary Care Harshini Trent: Raelene Bott Other Clinician: Massie Kluver Referring Mirabelle Cyphers: Treating Lesha Jager/Extender: Eddie North in Treatment: 40 Encounter Discharge Information Items Post Procedure Vitals Discharge Condition: Stable Temperature (F): 98.5 Ambulatory Status: Wheelchair Pulse (bpm): 67 Discharge Destination: Home Respiratory Rate (breaths/min): 16 Transportation: Private Auto Blood Pressure (mmHg): 132/72 Accompanied By: mother Schedule Follow-up Appointment: Yes Clinical Summary of Care: Electronic Signature(s) Signed: 12/08/2022 11:57:56 AM By: Massie Kluver Entered By: Massie Kluver on 12/07/2022 12:26:03 -------------------------------------------------------------------------------- Lower Extremity Assessment Details Patient Name: Date of Service: [name], [name] 12/07/2022 11:00 [name]: [id number] Patient Account Number: [id number] Date of Birth/Sex: Treating RN: 1976/05/18 (47 y.o. [name] Primary Care Fardowsa Authier: Raelene Bott Other Clinician: Massie Kluver Referring Tiki Tucciarone: Treating Graeden Bitner/Extender: Eddie North in Treatment: 59 Electronic Signature(s) Signed: 12/08/2022 11:57:56 AM By: Massie Kluver Signed: 12/09/2022 1:08:07 PM By: Gretta Cool, BSN, RN, CWS, [name] RN, BSN Entered By: Massie Kluver on 12/07/2022 11:46:43 -------------------------------------------------------------------------------- Multi Wound Chart Details Patient Name: Date of Service: [name] 12/07/2022 11:00 [name]: [id number] Patient Account Number: [id number] Date of Birth/Sex: Treating RN: 1975/10/30 (47 y.o. [name] Primary Care Zacaria Pousson: Raelene Bott Other Clinician: Massie Kluver Referring Alyanah Elliott: Treating Codie Krogh/Extender: Linna Hoff Weeks in Treatment: [address] Vital Signs [name], [name] [name] ([id number]) [id number] Page 4 of 11 Height(in): 69 Pulse(bpm): 67 Weight(lbs): 178 Blood Pressure(mmHg): 132/72 Body Mass Index(BMI): 26.3 Temperature(F): 98.5 Respiratory Rate(breaths/min): 16 [11:Photos:] [N/[name]:N/[name]] Right, Plantar Foot Left, Medial, Plantar Foot N/[name] Wound Location: Surgical Injury Pressure Injury N/[name] Wounding Event: Open Surgical Wound 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: Extremity Chronic sinus problems/congestion, Chronic sinus problems/congestion, N/[name] Comorbid History: Middle ear problems, Anemia, Chronic Middle ear problems, Anemia, Chronic Obstructive Pulmonary Disease Obstructive Pulmonary Disease (COPD), Congestive Heart Failure, (COPD), Congestive Heart Failure, Type II Diabetes, End Stage Renal Type II Diabetes, End Stage Renal Disease, History of pressure wounds, Disease, History of pressure wounds, Neuropathy Neuropathy 12/01/2021 03/16/2020 N/[name] Date Acquired: 63 72 N/[name] Weeks of Treatment: Open Open N/[name] Wound Status: No No  N/[name] Wound Recurrence: 0.8x0.6x0.5 1.5x0.7x0.2 N/[name] Measurements L x W x D (cm) 0.377 0.825 N/[name] [name] (cm) : rea 0.188 0.165 N/[name] Volume (cm) : 79.60% 82.50% N/[name] % Reduction in Area: 88.70% 82.50% N/[name] % Reduction in Volume: Full Thickness Without Exposed Grade 3 N/[name] Classification: Support Structures Medium Medium N/[name] Exudate Amount: Serosanguineous Serous N/[name] Exudate Type: red, brown amber N/[name] Exudate Color: Distinct, outline attached Flat and Intact N/[name] Wound Margin: Medium (34-66%) Small (1-33%) N/[name] Granulation Amount: Pale Red, Pink N/[name] Granulation Quality: Medium (34-66%) Large (67-100%) N/[name] Necrotic Amount: Fat Layer (Subcutaneous Tissue): Yes Fat Layer (Subcutaneous Tissue): Yes N/[name] Exposed Structures: Fascia: No Fascia: No Tendon: No Tendon: No Muscle: No Muscle: No Joint: No Joint: No Bone: No Bone: No Medium (34-66%) None N/[name] Epithelialization: Treatment Notes Electronic Signature(s) Signed:  12/08/2022 11:57:56 AM By: Massie Kluver Entered By: Massie Kluver on 12/07/2022 11:46:48 -------------------------------------------------------------------------------- Multi-Disciplinary Care Plan Details Patient Name: Date of Service: [name], [place]. 12/07/2022 11:00 [name]: [id number] Patient Account Number: [id number] [name], [name] ([id number]) [phone].pdf Page 5 of 11 Date of Birth/Sex: Treating RN: 1976/02/10 (47 y.o. [name] Primary Care Juanmanuel Marohl: Other Clinician: Glade Nurse Referring Deryck Hippler: Treating Eloise Mula/Extender: Eddie North in Treatment: 71 Active Inactive Abuse / Safety / Falls / Self Care Management Nursing Diagnoses: History of Falls Potential for falls Potential for injury related to falls Goals: Patient will not develop complications from immobility Date Initiated: 03/17/2022 Date Inactivated: 04/20/2022 Target Resolution Date:  03/16/2022 Goal Status: Met Patient/caregiver will verbalize understanding of skin care regimen Date Initiated: 03/17/2022 Date Inactivated: 09/28/2022 Target Resolution Date: 03/16/2022 Goal Status: Met Patient/caregiver will verbalize/demonstrate measure taken to improve self care Date Initiated: 03/17/2022 Target Resolution Date: 03/16/2022 Goal Status: Active Interventions: Assess fall risk on admission and as needed Provide education on basic hygiene Provide education on personal and home safety Notes: Necrotic Tissue Nursing Diagnoses: Impaired tissue integrity related to necrotic/devitalized tissue Knowledge deficit related to management of necrotic/devitalized tissue Goals: Necrotic/devitalized tissue will be minimized in the wound bed Date Initiated: 03/17/2022 Target Resolution Date: 10/26/2022 Goal Status: Active Patient/caregiver will verbalize understanding of reason and process for debridement of necrotic tissue Date Initiated: 03/17/2022 Date Inactivated: 09/28/2022 Target Resolution Date: 03/16/2022 Goal Status: Met Interventions: Provide education on necrotic tissue and debridement process Treatment Activities: Apply topical anesthetic as ordered : 03/16/2022 Excisional debridement : 03/16/2022 Notes: Osteomyelitis Nursing Diagnoses: Infection: osteomyelitis Knowledge deficit related to disease process and management Goals: Patient/caregiver will verbalize understanding of disease process and disease management Date Initiated: 03/17/2022 Target Resolution Date: 03/16/2022 Goal Status: Active Patient's osteomyelitis will resolve Date Initiated: 03/17/2022 Target Resolution Date: 10/26/2022 Goal Status: Active Signs and symptoms for osteomyelitis will be recognized and promptly addressed Date Initiated: 03/17/2022 Target Resolution Date: 10/26/2022 Goal Status: Active Interventions: Assess for signs and symptoms of osteomyelitis resolution every visit Provide education  on osteomyelitis [name], [name] [name] ([id number]) [phone].pdf Page 6 of 11 Treatment Activities: Systemic antibiotics : 03/16/2022 Notes: Patient on antibiotics. Pressure Nursing Diagnoses: Knowledge deficit related to management of pressures ulcers Potential for impaired tissue integrity related to pressure, friction, moisture, and shear Goals: Patient will remain free from development of additional pressure ulcers Date Initiated: 11/02/2022 Target Resolution Date: 11/09/2022 Goal Status: Active Patient/caregiver will verbalize risk factors for pressure ulcer development Date Initiated: 11/02/2022 Target Resolution Date: 11/02/2022 Goal Status: Active Patient/caregiver will verbalize understanding of pressure ulcer management Date Initiated: 11/02/2022 Target Resolution Date: 11/02/2022 Goal Status: Active Interventions: Assess: immobility, friction, shearing, incontinence upon admission and as needed Provide education on pressure ulcers Notes: Electronic Signature(s) Signed: 12/08/2022 11:57:56 AM By: Massie Kluver Signed: 12/09/2022 1:08:07 PM By: Gretta Cool, BSN, RN, CWS, [name] RN, BSN Entered By: Massie Kluver on 12/07/2022 12:25:02 -------------------------------------------------------------------------------- Pain Assessment Details Patient Name: Date of Service: [name] 12/07/2022 11:00 [name]: [id number] Patient Account Number: [id number] Date of Birth/Sex: Treating RN: 24-Jan-1976 (47 y.o. [name] Primary Care Caterin Tabares: Raelene Bott Other Clinician: Massie Kluver Referring Davisha Linthicum: Treating Clint Strupp/Extender: Eddie North in Treatment: 47 Active Problems Location of Pain Severity and Description of Pain Patient Has Paino No Site Locations [place], [place] [name] ([id number]) [id number] Page 7 of 11 Pain Management and Medication Current Pain Management: Electronic  Signature(s) Signed:  12/08/2022 11:57:56 AM By: Massie Kluver Signed: 12/09/2022 1:08:07 PM By: Gretta Cool, BSN, RN, CWS, [name] RN, BSN Entered By: Massie Kluver on 12/07/2022 11:40:36 -------------------------------------------------------------------------------- Patient/Caregiver Education Details Patient Name: Date of Service: [name], Tekela [name]. 2/14/2024andnbsp11:00 [place] Record Number: [id number] Patient Account Number: [id number] Date of Birth/Gender: Treating RN: 06/17/1976 (47 y.o. [name] Primary Care Physician: Raelene Bott Other Clinician: Massie Kluver Referring Physician: Treating Physician/Extender: Eddie North in Treatment: 29 Education Assessment Education Provided To: Patient Education Topics Provided Wound/Skin Impairment: Handouts: Other: continue wound care as directed Methods: Explain/Verbal Responses: State content correctly Electronic Signature(s) Signed: 12/08/2022 11:57:56 AM By: Massie Kluver Entered By: Massie Kluver on 12/07/2022 12:24:54 [name] ([id number] Page 8 of 11 -------------------------------------------------------------------------------- Wound Assessment Details Patient Name: Date of Service: [name], [name] 12/07/2022 11:00 [name]: [id number] Patient Account Number: [id number] Date of Birth/Sex: Treating RN: [birth year] (47 y.o. [name], [name] Primary Care Allysia Ingles: Raelene Bott Other Clinician: Massie Kluver Referring Ibn Stief: Treating Dannilynn Gallina/Extender: Linna Hoff Weeks in Treatment: 38 Wound Status Wound Number: 11 Primary Open Surgical Wound Etiology: Wound Location: Right, Plantar Foot Wound Open Wounding Event: Surgical Injury Status: Date Acquired: 12/01/2021 Comorbid Chronic sinus problems/congestion, Middle ear problems, Anemia, Weeks Of Treatment: 38 History: Chronic Obstructive Pulmonary  Disease (COPD), Congestive Heart Clustered Wound: No Failure, Type II Diabetes, End Stage Renal Disease, History of pressure wounds, Neuropathy Photos Wound Measurements Length: (cm) 0.8 Width: (cm) 0.6 Depth: (cm) 0.5 Area: (cm) 0.377 Volume: (cm) 0.188 % Reduction in Area: 79.6% % Reduction in Volume: 88.7% Epithelialization: Medium (34-66%) Wound Description Classification: Full Thickness Without Exposed Suppo Wound Margin: Distinct, outline attached Exudate Amount: Medium Exudate Type: Serosanguineous Exudate Color: red, brown rt Structures Foul Odor After Cleansing: No Slough/Fibrino Yes Wound Bed Granulation Amount: Medium (34-66%) Exposed Structure Granulation Quality: Pale Fascia Exposed: No Necrotic Amount: Medium (34-66%) Fat Layer (Subcutaneous Tissue) Exposed: Yes Tendon Exposed: No Muscle Exposed: No Joint Exposed: No Bone Exposed: No Treatment Notes Wound #11 (Foot) Wound Laterality: Plantar, Right Cleanser Wound Cleanser Discharge Instruction: Wash your hands with soap and water. Remove old dressing, discard into plastic bag and place into trash. Cleanse the [name], [name] [name] ([id number]) [id number] Page 9 of 11 wound with Wound Cleanser prior to applying [name] clean dressing using gauze sponges, not tissues or cotton balls. Do not scrub or use excessive force. Pat dry using gauze sponges, not tissue or cotton balls. Peri-Wound Care Topical Gentamicin Discharge Instruction: Apply as directed by Lechelle Wrigley. in clinic keystone gel Discharge Instruction: at home Primary Dressing Aquacel Extra Hydrofiber Dressing, 2x2 (in/in) Secondary Dressing ABD Pad 5x9 (in/in) Discharge Instruction: Cover with ABD pad Secured With [place] H Soft Cloth Surgical T ape ape, 2x2 (in/yd) Kerlix Roll Sterile or Non-Sterile 6-ply 4.5x4 (yd/yd) Discharge Instruction: Apply Kerlix as directed Compression Wrap Compression  Stockings Add-Ons Electronic Signature(s) Signed: 12/08/2022 11:57:56 AM By: Massie Kluver Signed: 12/09/2022 1:08:07 PM By: Gretta Cool, BSN, RN, CWS, [name] RN, BSN Entered By: Massie Kluver on 12/07/2022 11:46:03 -------------------------------------------------------------------------------- Wound Assessment Details Patient Name: Date of Service: [name] 12/07/2022 11:00 [name]: [id number] Patient Account Number: [id number] Date of Birth/Sex: Treating RN: [birth year] (47 y.o. [name] Primary Care Saverio Kader: Raelene Bott Other Clinician: Massie Kluver Referring Idelle [name]: Treating Kolina Kube/Extender: Linna Hoff Weeks in Treatment: 38 Wound Status Wound Number: 12 Primary Diabetic Wound/Ulcer of the Lower Extremity Etiology: Wound  Location: Left, Medial, Plantar Foot Wound Open Wounding Event: Pressure Injury Status: Date Acquired: 03/16/2020 Comorbid Chronic sinus problems/congestion, Middle ear problems, Anemia, Weeks Of Treatment: 38 History: Chronic Obstructive Pulmonary Disease (COPD), Congestive Heart Clustered Wound: No Failure, Type II Diabetes, End Stage Renal Disease, History of pressure wounds, Neuropathy Photos [name], [name] [name] ([id number]) [phone].pdf Page 10 of 11 Wound Measurements Length: (cm) 1.5 Width: (cm) 0.7 Depth: (cm) 0.2 Area: (cm) 0.825 Volume: (cm) 0.165 % Reduction in Area: 82.5% % Reduction in Volume: 82.5% Epithelialization: None Wound Description Classification: Grade 3 Wound Margin: Flat and Intact Exudate Amount: Medium Exudate Type: Serous Exudate Color: amber Foul Odor After Cleansing: No Slough/Fibrino No Wound Bed Granulation Amount: Small (1-33%) Exposed Structure Granulation Quality: Red, Pink Fascia Exposed: No Necrotic Amount: Large (67-100%) Fat Layer (Subcutaneous Tissue) Exposed: Yes Necrotic Quality: Adherent Slough Tendon Exposed: No Muscle  Exposed: No Joint Exposed: No Bone Exposed: No Treatment Notes Wound #12 (Foot) Wound Laterality: Plantar, Left, Medial Cleanser Wound Cleanser Discharge Instruction: Wash your hands with soap and water. Remove old dressing, discard into plastic bag and place into trash. Cleanse the wound with Wound Cleanser prior to applying [name] clean dressing using gauze sponges, not tissues or cotton balls. Do not scrub or use excessive force. Pat dry using gauze sponges, not tissue or cotton balls. Peri-Wound Care Topical Gentamicin Discharge Instruction: Apply as directed by Malikai Gut. in clinic Primary Dressing Secondary Dressing ABD Pad 5x9 (in/in) Discharge Instruction: Cover with ABD pad Secured With [place] H Soft Cloth Surgical T ape ape, 2x2 (in/yd) Kerlix Roll Sterile or Non-Sterile 6-ply 4.5x4 (yd/yd) Discharge Instruction: Apply Kerlix as directed Compression Wrap Compression Stockings Add-Ons Electronic Signature(s) Signed: 12/08/2022 11:57:56 AM By: Massie Kluver Signed: 12/09/2022 1:08:07 PM By: Gretta Cool, BSN, RN, CWS, [name] RN, BSN Entered By: Massie Kluver on 12/07/2022 11:46:33 [name] ([id number]) [id number] Page 11 of 11 -------------------------------------------------------------------------------- Vitals Details Patient Name: Date of Service: [name], [name] 12/07/2022 11:00 [name]: [id number] Patient Account Number: [id number] Date of Birth/Sex: Treating RN: [birth year] (47 y.o. [name], [name] Primary Care Jeraldean Wechter: Raelene Bott Other Clinician: Massie Kluver Referring Lamere Lightner: Treating Evangelynn Lochridge/Extender: Eddie North in Treatment: 38 Vital Signs Time Taken: 11:35 Temperature (F): 98.5 Height (in): 69 Pulse (bpm): 67 Weight (lbs): 178 Respiratory Rate (breaths/min): 16 Body Mass Index (BMI): 26.3 Blood Pressure (mmHg): 132/72 Reference Range: 80 - 120 mg /  dl Electronic Signature(s) Signed: 12/08/2022 11:57:56 AM By: Massie Kluver Entered By: Massie Kluver on 12/07/2022 11:40:26

## 2022-12-14 ENCOUNTER — Encounter (HOSPITAL_BASED_OUTPATIENT_CLINIC_OR_DEPARTMENT_OTHER): Payer: Medicaid Other | Admitting: Internal Medicine

## 2022-12-14 DIAGNOSIS — L97528 Non-pressure chronic ulcer of other part of left foot with other specified severity: Secondary | ICD-10-CM | POA: Diagnosis not present

## 2022-12-14 DIAGNOSIS — L97512 Non-pressure chronic ulcer of other part of right foot with fat layer exposed: Secondary | ICD-10-CM | POA: Diagnosis not present

## 2022-12-14 DIAGNOSIS — E11621 Type 2 diabetes mellitus with foot ulcer: Secondary | ICD-10-CM

## 2022-12-19 NOTE — Progress Notes (Signed)
[name] [name] ([id number]) [id number] Page 1 of 22 Visit Report for 12/14/2022 Chief Complaint Document Details Patient Name: Date of Service: [name], [name] 12/14/2022 11:00 [name]: [id number] Patient Account Number: [id number] Date of Birth/Sex: Treating RN: [birth year] (47 y.o. [name] Primary Care Provider: Raelene [name] Other Clinician: Massie [name] Referring Provider: Treating Provider/Extender: [name] in Treatment: 39 Information Obtained from: Patient Chief Complaint 03/19/2021; patient referred by Dr. Luana [name] who has been looking after her left foot for quite [name] period of time for review of [name] nonhealing area in the left midfoot 03/12/2022; bilateral feet wounds and right lower extremity wound. Electronic Signature(s) Signed: 12/14/2022 11:45:14 AM By: Heather Shan DO Entered By: [name] on 12/14/2022 11:32:04 -------------------------------------------------------------------------------- Debridement Details Patient Name: Date of Service: [name], [name] 12/14/2022 11:00 [name]: [id number] Patient Account Number: [id number] Date of Birth/Sex: Treating RN: [birth year] (47 y.o. [name] Primary Care Provider: Raelene [name] Other Clinician: Massie [name] Referring Provider: Treating Provider/Extender: [name] in Treatment: 39 Debridement Performed for Assessment: Wound #12 Left,Medial,Plantar Foot Performed By: Physician Heather Shan, MD Debridement Type: Debridement Severity of Tissue Pre Debridement: Fat layer exposed Level of Consciousness (Pre-procedure): Awake and Alert Pre-procedure Verification/Time Out Yes - 11:26 Taken: Start Time: 11:26 T Area Debrided (L x W): otal 2 (cm) x 2 (cm) = 4 (cm) Tissue and other material debrided: Viable, Non-Viable, Callus, Slough, Slough Level: Non-Viable  Tissue Debridement Description: Selective/Open Wound Instrument: Curette Bleeding: Moderate Hemostasis Achieved: Pressure Response to Treatment: Procedure was tolerated well Level of Consciousness (Post- Awake and Alert procedure): [name], [name] ([id number]) [id number] Page 2 of 22 Post Debridement Measurements of Total Wound Length: (cm) 1.3 Width: (cm) 1.6 Depth: (cm) 0.1 Volume: (cm) 0.163 Character of Wound/Ulcer Post Debridement: Stable Severity of Tissue Post Debridement: Fat layer exposed Post Procedure Diagnosis Same as Pre-procedure Electronic Signature(s) Signed: 12/14/2022 11:45:14 AM By: Heather Shan DO Signed: 12/15/2022 11:31:26 AM By: [name] Signed: 12/19/2022 9:50:02 AM By: [name] Entered By: [name] on 12/14/2022 11:27:59 -------------------------------------------------------------------------------- Debridement Details Patient Name: Date of Service: [name] 12/14/2022 11:00 [name]: [id number] Patient Account Number: [id number] Date of Birth/Sex: Treating RN: 25-Dec-1975 (47 y.o. [name] Primary Care Provider: Raelene [name] Other Clinician: Massie [name] Referring Provider: Treating Provider/Extender: [name] in Treatment: 39 Debridement Performed for Assessment: Wound #11 Right,Plantar Foot Performed By: Physician Heather Shan, MD Debridement Type: Debridement Level of Consciousness (Pre-procedure): Awake and Alert Pre-procedure Verification/Time Out Yes - 11:28 Taken: Start Time: 11:28 T Area Debrided (L x W): otal 1 (cm) x 0.5 (cm) = 0.5 (cm) Tissue and other material debrided: Viable, Non-Viable, Callus, Slough, Subcutaneous, Slough Level: Skin/Subcutaneous Tissue Debridement Description: Excisional Instrument: Curette Bleeding: Moderate Hemostasis Achieved: Pressure Response to Treatment: Procedure was  tolerated well Level of Consciousness (Post- Awake and Alert procedure): Post Debridement Measurements of Total Wound Length: (cm) 0.5 Width: (cm) 0.4 Depth: (cm) 0.4 Volume: (cm) 0.063 Character of Wound/Ulcer Post Debridement: Stable Post Procedure Diagnosis Same as Pre-procedure Electronic Signature(s) Signed: 12/14/2022 11:45:14 AM By: Heather Shan DO Signed: 12/15/2022 11:31:26 AM By: [name] Signed: 12/19/2022 9:50:02 AM By: [name] Entered By: [name] on 12/14/2022 11:29:14 [name] ([id number]) [id number] Page 3 of 22 -------------------------------------------------------------------------------- HPI Details Patient Name: Date of Service: [name]  12/14/2022 11:00 [name]: [id number] Patient Account Number: [id number] Date of Birth/Sex: Treating RN: 09/24/1976 (47 y.o. [name] Primary Care Provider: Raelene [name] Other Clinician: Massie [name] Referring Provider: Treating Provider/Extender: [name] in Treatment: 39 History of Present Illness HPI Description: 01/18/18-She is here for initial evaluation of the left great toe ulcer. She is [name] poor historian in regards to timeframe in detail. She states approximately 4 weeks ago she lacerated her toe on something in the house. She followed up with her primary care who placed her on Bactrim and ultimately [name] second dose of Bactrim prior to coming to wound clinic. She states she has been treating the toe with peroxide, Betadine and [name] Band-Aid. She did not check her blood sugar this morning but checked it yesterday morning it was 327; she is unaware of [name] recent A1c and there are no current records. She saw Dr. she would've orthopedics last week for an old injury to the left ankle, she states he did not see her toe, nor did she bring it to his attention. She smokes approximately 1 pack  cigarettes [name] day. Her social situation is concerning, she arrives this morning with her mother who appears extremely intoxicated/under the influence; her mother was asked to leave the room and be monitored by the patient's grandmother. The patient's aunt then accompanied the patient and the room throughout the rest of the appointment. We had [name] lengthy discussion regarding the deleterious effects of uncontrolled hyperglycemia and smoking as it relates to wound healing and overall health. She was strongly encouraged to decrease her smoking and get her diabetes under better control. She states she is currently on [name] diet and has cut down her [facility] consumption. The left toe is erythematous, macerated and slightly edematous with malodor present. The edema in her left foot is below her baseline, there is no erythema streaking. We will treat her with Santyl, doxycycline; we have ordered and xray, culture and provided [name] Peg assist surgical shoe and cultured the wound. 01/25/18-She is here in follow-up evaluation for [name] left great toe ulcer and presents with an abscess to her suprapubic area. She states her blood sugars remain elevated, feeling "sick" and if levels are below 250, but she is trying. She has made no attempt to decrease her smoking stating that we "can't take away her food in her cigarettes". She has been compliant with offloading using the PEG assist you. She is using Santyl daily. the culture obtained last week grew staph aureus and Enterococcus faecalis; continues on the doxycycline and Augmentin was added on Monday. The suprapubic area has erythema, no femoral variation, purple discoloration, minimal induration, was accessed with [name] cotton tip applicator with sanguinopurulent drainage, this was cultured, I suspect the current antibiotic treatment will cover and we will not add anything to her current treatment plan. She was advised to go to urgent care or ER with any change in redness,  induration or fever. 02/01/18-She is here in follow-up evaluation for left great toe ulcers and [name] new abdominal abscess from last week. She was able to use packing until earlier this week, where she "forgot it was there". She states she was feeling ill with GI symptoms last week and was not taking her antibiotic. She states her glucose levels have been predominantly less than 200, with occasional levels between 200-250. She thinks this was contributing to her GI symptoms as they have resolved without intervention. There continues to be significant  laceration to left toe, otherwise it clinically looks stable/improved. There is now less superficial opening to the lateral aspect of the great toe that was residual blister. We will transition to [facility] to all wounds, she will continue her Augmentin. If there is no change or deterioration next week for reculture. 02/08/18-She is here in follow-up evaluation for left great toe ulcer and abdominal ulcer. There is an improvement in both wounds. She has been wrapping her left toe with coban, not by our direction, which has created an area of discoloration to the medial aspect; she has been advised to NOT use coban secondary to her neuropathy. She states her glucose levels have been high over this last week ranging from 200-350, she continues to smoke. She admits to being less compliant with her offloading shoe. We will continue with same treatment plan and she will follow-up next week. 02/15/18-She is here in follow-up evaluation for left great toe ulcer and abdominal ulcer. The abdominal ulcer is epithelialized. The left great toe ulcer is improved and all injury from last week using the Coban wrap is resolved, the lateral ulcer is healed. She admits to noncompliance with wearing offloading shoe and admits to glucose levels being greater than 300 most of the week. She continues to smoke and expresses no desire to quit. There is one area medially  that probes deeper than it has historically, erythema to the toe and dorsal foot has consistently waxed and waned. There is no overt signs of cellulitis or infection but we will culture the wound for any occult infection given the new area of depth and erythema. We will hold off on sensitivities for initiation of antibiotic therapy. 02/22/18-She is here in follow up evaluation for left great toe ulcer. There is overall significant improvement in both wound appearance, erythema and edema with changes made last week. She was not initiated on antibiotic therapy. Culture obtained last week showed oxacillin sensitive staph aureus, sensitive to clindamycin. Clindamycin has been called into the pharmacy but she has been instructed to hold off on initiation secondary to overall clinical improvement and her history of antibiotic intolerance. She has been instructed to contact the clinic with any noted changes/deterioration and the wound, erythema, edema and/or pain. She will follow-up next week. She continues to smoke and her glucose levels remain elevated >250; she admits to compliance with offloading shoe 03/01/18 on evaluation today patient appears to be doing fairly well in regard to her left first toe ulcer. She has been tolerating the dressing changes with the [facility] Dressing without complication and overall this has definitely showed signs of improvement according to records as well is what the patient tells me today. I'm very pleased in that regard. She is having no pain today 03/08/18 She is here for follow up evaluation of [name] left great toe ulcer. She remains non-compliant with glucose control and smoking cessation; glucose levels consistently >200. She states that she got new shoe inserts/peg assist. She admits to compliance with offloading. Since my last evaluation there is significant improvement. We will switch to prisma at this time and she will follow up next week. She is noted to be  tachycardic at this appointment, heart rate 120s; she has [name] history of heart rate 70-130 according to our records. She admits to extreme agitation r/t personal issues; she was advised to monitor her heartrate and contact her physician if it does not return to [name] more normal range (<100). She takes cardizem twice daily. 03/15/18-She is here  in follow-up evaluation for left great toe ulcer. She remains noncompliant with glucose control and smoking cessation. She admits to compliance with wearing offloading shoe. The ulcer is improved/stable and we will continue with the same treatment plan and she will follow-up next week 03/22/18-She is here for evaluation for left great toe ulcer. There continues to be significant improvement despite recurrent hyperglycemia (over 500 yesterday) and she continues to smoke. She has been compliant with offloading and we will continue with same treatment plan and she will follow-up next week. 03/29/18-She is here for evaluation for left great toe ulcer. Despite continuing to smoke and uncontrolled diabetes she continues to improve. She is compliant with offloading shoe. We will continue with the same treatment plan and she will follow-up next week 04/05/18- She is here in follow up evaluation for [name] left great toe ulcer; she presents with small pustule to left fifth toe (resembles ant bite). She admits to compliance with wearing offloading shoe; continues to smoke or have uncontrolled blood glucose control. There is more callus than usual with evidence of bleeding; she denies known trauma. 04/12/18-She is here for evaluation of left great toe ulcer. Despite noncompliance with glycemic control and smoking she continues to make improvement. She continues to wear offloading shoe. The pustule, that was identified last week, to the left fifth toe is resolved. She will follow-up in 2 weeks 05/03/18-she is seen in follow-up evaluation for [name] left great toe ulcer. She is compliant with  offloading, otherwise noncompliant with glycemic control and Warnick, Jeany [name] ([id number]) [phone].pdf Page 4 of 22 smoking. She has plateaued and there is minimal improvement noted. We will transition to [facility], replaced the insert to her surgical shoe and she will follow-up in one week 05/10/18- She is here in follow up evaluation for [name] left great toe ulcer. It appears stable despite measurement change. We will continue with same treatment plan and follow up next week. 05/24/18-She is seen in follow-up evaluation for [name] left great toe ulcer. She remains compliant with offloading, has made significant improvement in her diet, decreasing the amount of sugar/soda. She said her recent A1c was 10.9 which is lower than. She did see [name] diabetic nutritionist/educator yesterday. She continues to smoke. We will continue with the same treatment plan and she'll follow-up next week. 05/31/18- She is seen in follow-up evaluation for left great toe ulcer. She continues to remain compliant with offloading, continues to make improvement in her diet, increasing her water and decreasing the amount of sugar/soda. She does continue to smoke with no desire to quit. We will apply Prisma to the depth and Hydrofera Blue over. We have not received insurance authorization for oasis. She will follow up next week. 06/07/18-She is seen in follow-up evaluation for left great toe ulcer. It has stalled according to today's measurements although base appears stable. She says she saw [name] diabetic educator yesterday; her average blood sugars are less than 300 which is an improvement for her. She continues to smoke and states "that's my next step" She continues with water over soda. We will order for xray, culture and reinstate ace wrap compression prior to placing apligraf for next week. She is voicing no complaints or concerns. Her dressing will change to iodoflex over the next week in preparation for  apligraf. 06/14/18-She is seen in follow-up evaluation for left great toe ulcer. Plain film x-ray performed last week was negative for osteomyelitis. Wound culture obtained last week grew strep B and OSSA; she is initiated  on keflex and cefdinir today; there is erythema to the toe which could be from ace wrap compression, she has [name] history of wrapping too tight and has has been encouraged to maintain ace wraps that we place today. We will hold off on application of apligraf today, will apply next week after antibiotic therapy has been initiated. She admits today that she has resumed taking [name] shower with her foot/toe submerged in water, she has been reminded to keep foot/toe out of the bath water. She will be seen in follow up next week 06/21/18-she is seen in follow-up evaluation for left great toe ulcer. She is tolerating antibiotic therapy with no GI disturbance. The wound is stable. Apligraf was applied today. She has been decreasing her smoking, only had 4 cigarettes yesterday and 1 today. She continues being more compliant in diabetic diet. She will follow-up next week for evaluation of site, if stable will remove at 2 weeks. 06/28/18- She is here in follow up evalution. Apligraf was placed last week, she states the dressing fell off on Tuesday and she was dressing with hydrofera blue. She is healed and will be discharged from the clinic today. She has been instructed to continue with smoking cessation, continue monitoring glucose levels, offloading for an additional 4 weeks and continue with hydrofera blue for additional two weeks for any possible microscopic opening. Readmission: 08/07/18 on evaluation today patient presents for reevaluation concerning the ulcer of her right great toe. She was previously discharged on 06/28/18 healed. Nonetheless she states that this began to show signs of drainage she subsequently went to her primary care provider. Subsequently an x-ray was performed on 08/01/18  which was negative. The patient was also placed on antibiotics at that time. Fortunately they should have been effective for the infection. Nonetheless she's been experiencing some improvement but still has [name] lot of drainage coming from the wound itself. 08/14/18 on evaluation today patient's wound actually does show signs of improvement in regard to the erythema at this point. She has completed the antibiotics. With that being said we did discuss the possibility of placing her in [name] total contact cast as of today although I think that I may want to give this just [name] little bit more time to ensure nothing recurrence as far as her infection is concerned. I do not want to put in the cast and risk infection at that time if things are not completely resolved. With that being said she is gonna require some debridement today. 08/21/18 on evaluation today patient actually appears to be doing okay in regard to her toe ulcer. She's been tolerating the dressing changes without complication. With that being said it does appear that she is ready and in fact I think it's appropriate for [place] to go ahead and initiate the total contact cast today. Nonetheless she will require some sharp debridement to prepare the wound for application. Overall I feel like things have been progressing well but we do need to do something to get this to close more readily. 08/24/18 patient seen today for reevaluation after having had the total contact cast applied on Tuesday. She seems to have done very well the wound appears to be doing great and overall I'm pleased with the progress that she's made. There were no abnormal areas of rubbing from the cast on her lower extremity. 08/30/18 on evaluation today patient actually appears to be completely healed in regard to her plantar toe ulcer. She tells me at this point she's been having  [name] lot of issues with the cast. She almost fell [name] couple of times the state shall the step of her dog [name] couple  times as well. This is been [name] very frustrating process for her other nonetheless she has completely healed the wound which is excellent news. Overall there does not appear to be the evidence of infection at this time which is great news. 09/11/18 evaluation today patient presents for follow-up concerning her great toe ulcer on the left which has unfortunately reopened since I last saw her which was only [name] couple of weeks ago. Unfortunately she was not able to get in to get the shoe and potentially the AFO that's gonna be necessary due to her left foot drop. She continues with offloading shoe but this is not enough to prevent her from reopening it appears. When we last had her in the total contact cast she did well from [name] healing standpoint but unfortunately the wound reopened as soon as she came out of the cast within just [name] couple of weeks. 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 This is also part of the reason why she falls on [name] regular basis. I just do not believe that is gonna be safe for [place] to reinitiate the total contact cast as last time we had this on she fell 3 times one week which is definitely not normal for her. 09/18/18 upon evaluation today the patient actually appears to be doing about the same in regard to her toe ulcer. She did not contact [facility] as I asked her to even though I had given her the prescription. In fact she actually states that she has no idea where the prescription is. She did apparently call [facility] and they told her that all she needed to do was bring the prescription in order to be able to be seen and work on getting the AFO for her left foot. With all that being said she still does not have an appointment and I'm not sure were things stand that regard. I will give her  [name] new prescription today in order to contact them to get this set up. 09/25/18 on evaluation today patient actually appears to be doing about the same in regard to her toes ulcer. She does have [name] small areas which seems to have [name] lot of callous buildup around the edge of the wound which is going to need sharp debridement today. She still is waiting to be scheduled for evaluation with [facility] for possibility of an AFO. She states there supposed to call her tomorrow to get this set up. Unfortunately it does appear that her foot specifically the toe area is showing signs of erythema. There does not appear to be any systemic infection which is in these good news. 10/02/18 on evaluation today patient actually appears to be doing about the same in regard to her toe ulcer. This really has not done too well although it's not significantly larger it's also not significantly smaller. She has been tolerating the dressing changes without complication. She actually has her appointment with [facility] and [facility] tomorrow to hopefully be measured for obtaining and AFO splint. I think this would be helpful preventing this from reoccurring. We had contemplated starting the cast this week although to be honest  I am reluctant to do that as she's been having nausea, vomiting, and seizure activity over the past three days. She has [name] history of seizures and have been told is nothing that can be done for these. With that being said I do believe that along with the seizures have the nausea vomiting which upon further questioning doesn't seem to be the normal for her and makes me concerned for the possibility of infection or something else going on. I discussed this with the patient and her mother during the office visit today. I do not feel the wound is effective but maybe something else. The responses this was "this just happens to her at times and we don't know why". They did not seem to be interested in going to the hospital  to have this checked out further. 10/09/18 on evaluation today patient presents for follow-up concerning her ongoing toe ulcer. She has been tolerating the dressing changes without complication. Fortunately there does not appear to be any evidence of infection which is great news however I do think that the patient would benefit from going ahead for with the total contact cast. She's actually in [name] wheelchair today she tells me that she will use her walker if we initiate the cast. I was very specific about the fact that if we were gonna do the cast I wanted to make sure that she was using the walker in order to prevent any falls. She tells me she does not have stairs that she has to traverse on [name] regular basis at her home. She has not had any seizures since last week again that something that happens to her often she tells me she did talk to [employer] and they said that it may take up to three weeks to get the brace approved for her. Hopefully that will not take that long but nonetheless in the meantime I do think the cast could be of benefit. 10/12/18 on evaluation today patient appears to be doing rather well in regard to her toe ulcer. It's just been [name] few days and already this is significantly improved both as far as overall appearance and size. Fortunately there's no sign of infection. She is here for her first obligatory cast change. [name], [name] ([id number]) [id number] Page 5 of 22 10/19/18 Seen today for follow up and management of left great toe ulcer. Wound continues to show improvement. Noted small open area with seroussang drainage with palpation. Denies any increased pain or recent fevers during visit. She will continue calcium alginate with offloading shoe. Denies any questions or concerns during visit. 10/26/18 on evaluation today patient appears to be doing about the same as when I last saw her in regard to her wound bed. Fortunately there does not appear  to be any signs of infection. Unfortunately she continues to have [name] breakdown in regard to the toe region any time that she is not in the cast. It takes almost no time at all for this to happen. Nonetheless she still has not heard anything from the brace being made by [employer] as to when exactly this will be available to her. Fortunately there is no signs of infection at this time. 10/30/18 on evaluation today patient presents for application of the total contact cast as we just received him this morning. Fortunately we are gonna be able to apply this to her today which is great news. She continues to have no significant pain which is good news. Overall I do feel like things  have been improving while she was the cast is when she doesn't have [name] cast that things get worse. She still has not really heard anything from [facility] regarding her brace. 11/02/18 upon evaluation today patient's wound already appears to be doing significantly better which is good news. Fortunately there does not appear to be any signs of infection also good news. Overall I do think the total contact cast as before is helping to heal this area unfortunately it's just not gonna likely keep the area closed and healed without her getting her brace at least. Again the foot drop is [name] significant issue for her. 11/09/18 on evaluation today patient appears to be doing excellent in regard to her toe ulcer which in fact is completely healed. Fortunately we finally got the situation squared away with the paperwork which was needed to proceed with getting her brace approved by Medicaid. I have filled that out unfortunately that information has been sent to the orthopedic office that I worked at 2 1/2 years ago and not tired Current wound care measures. Fortunately she seems to be doing very well at this time. 11/23/18 on evaluation today patient appears to be doing More Poorly Compared to Last Time I Saw Her. At [facility] She Had Completely  Healed. Currently she is continuing to have issues with reopening. She states that she just found out that the brace was approved through Medicaid now she just has to go get measured in order to have this fitted for her and then made. Subsequently she does not have an appointment for this yet that is going to complicate things we obviously cannot put her back in the cast if we do not have everything measured because they're not gonna be able to measure her foot while she is in the cast. 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 Obviously this is unfortunate and does have me somewhat worried as well. 11/30/18 on evaluation today patient's toe ulcer actually appears to be doing fairly well. The good news is she will be getting her brace in the shoes next week on Wednesday. Hopefully we will be able to get this to heal without having to go back in the cast however she may need the cast in order to get the wound completely heal and then go from there. Fortunately there's no signs of infection at this time. 12/07/18 on evaluation today patient fortunately did receive her brace and she states she could tell this definitely makes her walk better. With that being said she's been having issues with her toe where she noticed yesterday there was [name] lot of tissue that was loosing off this appears to be much larger than what it was previous. She also states that her leg has been read putting much across the top of her foot just about the ankle although this seems to be receiving somewhat. The total area is still red and appears to be someone infected as best I can tell. She is previously taken Bactrim and that may be [name] good option for her today as well. We are gonna see what I wound culture shows as well and I think that this is definitely appropriate. With that being said outside of the culture I still need to initiate  something in the interim and that's what I'm gonna go ahead and select Bactrim is [name] good option for her. 12/14/18 on  evaluation today patient appears to be doing better in regard to her left great toe ulcer as compared to last week's evaluation. There's still some erythema although this is significantly improved which is excellent news. Overall I do believe that she is making good progress is still gonna take some time before she is where I would like her to be from the standpoint of being able to place her back into the total contact cast. Hopefully we will be where we need to be by next week. 12/21/18 on evaluation today patient actually appears to be doing poorly in regard to her toe ulcer. She's been tolerating the dressing changes without complication. Fortunately there's no signs of systemic infection although she does have [name] lot of drainage from the toe ulcer and this does seem to be causing some issues at this point. She does have erythema on the distal portion of her toe that appears to be likely cellulitis. 12/28/18 on evaluation today patient actually appears to be doing [name] little better in my pinion in regard to her toe ulcer. With that being said she still does have some evidence of infection at this time and for her culture she had both E. coli as well as enterococcus as organisms noted on evaluation. For that reason I think that though the Keflex likely has treated the E. coli rather well this has really done nothing for the enterococcus. We are going to have to initiate treatment for this specifically. 01/04/19 on evaluation today patient's toe actually appears to be doing better from the standpoint of infection. She currently would like to see about putting the cash back on I think that this is appropriate as long as she takes care of it and keeps it from getting wet. She is gonna have some drainage we can definitely pass this up with Drawtex and alginate to try to prevent as much drainage  as possible from causing the problems. With that being said I do want to at least try her with the cast between now and Tuesday. If there any issues we can't continue to use it then I will discontinue the use of the cast at that point. 01/08/19 on evaluation today patient actually appears to be doing very well as far as her foot ulcer specifically the great toe on the left is concerned. She did have an area of rubbing on the medial aspect of her left ankle which again is from the cast. Fortunately there's no signs of infection at this point in this appears to be [name] very slight skin breakdown. The patient tells me she felt it rubbing but didn't think it was that bad. Fortunately there is no signs of active infection at this time which is good news. No fevers, chills, nausea, or vomiting noted at this time. 01/15/19 on evaluation today patient actually appears to be doing well in regard to her toe ulcer. Again as previous she seems to do well and she has the cast on which indicates to me that during the time she doesn't have [name] cast on she's putting way too much pressure on this region. Obviously I think that's gonna be an issue as with the current national emergency concerning the Covid-19 Virus it has been recommended that we discontinue the use of total contact casting by the chief medical officer of our company, Dr. Simona Huh. The reasoning is that if [name] patient becomes sick and cannot come into have the cast removed they could not just leave this on for an  additional two weeks. Obviously the hospitals also do not want to receive patient's who are sick into the emergency department to potentially contaminate the region and spread the Covid-19 Virus among other sick individuals within the hospital system. Therefore at this point we are suspending the use of total contact cast until the current emergency subsides. This was all discussed with the patient today as well. 01/22/19 on evaluation today patient's wound  on her left great toe appears to be doing slightly worse than previously noted last week. She tells me that she has been on this quite [name] bit in fact she tells me she's been awake for 38 straight hours. This is due to the fact that she's having to care for grandparents because nobody else will. She has been taking care of them for five the last seven days since I've seen her they both have dementia his is from [name] stroke and her grandmother's was progressive. Nonetheless she states even her mom who knows her condition and situation has only help two of those days to take care of them she's been taking care of the rest. Fortunately there does not appear to be any signs of active infection in regard to her toe at this point although obviously it doesn't look as good as it did previous. I think this is directly related to her not taking off the pressure and friction by way of taking things easy. Though I completely understand what's going on. 01/29/19 on evaluation today patient's tools are actually appears to be showing some signs of improvement today compared to last week's evaluation as far as not necessarily the overall size of the wound but the fact that she has some new skin growth in between the two ends of the wound opening. Overall I feel like she has done well she states that she had [name] family member give her what sounds to be [name] CAM walker boot which has been helpful as well. 02/05/19 on evaluation today patient's wound bed actually appears to be doing significantly better in regard to her overall appearance of the size of the wound. With that being said she is still having an issue with offloading efficiently enough to get this to close. Apparently there is some signs of infection at this point as well unfortunately. Previously she's done well of Augmentin I really do not see anything that needs to be culture currently but there theme and cellulitis of the foot that I'm seeing I'm gonna go ahead and  place her on an antibiotic today to try to help clear this up. 02/12/2019 on evaluation today patient actually appears to be doing poorly in regard to her overall wound status. She tells me she has been using her offloading shoe but actually comes in today wearing her tennis shoe with the AFO brace. Again as I previously discussed with her this is really not sufficient to allow the area to heal appropriately. Nonetheless she continues to be somewhat noncompliant and I do wonder based on what she has told my nurse in the past as to [name], [name] ([id number]) [id number] Page 6 of 22 whether or not [name] good portion of this noncompliance may be recreational drug and alcohol related. She has had [name] history of heroin overdose and this was fairly recently in the past couple of months that have been seeing her. Nonetheless overall I feel like her wound looks significantly worse today compared to what it was previous. She still has significant erythema despite the Augmentin I  am not sure that this is an appropriate medication for her infection I am also concerned that the infection may have gone down into her bone. 02/19/19 on evaluation today patient actually appears to be doing about the same in regard to her toe ulcer. Unfortunately she continues to show signs of bone exposure and infection at this point. There does not appear to be any evidence of worsening of the infection but I'm also not really sure that it's getting significantly better. She is on the Augmentin which should be sufficient for the Staphylococcus aureus infection that she has at this point. With that being said she may need IV antibiotics to more appropriately treat this. We did have [name] discussion today about hyperbaric option therapy. 02/28/19 on evaluation today patient actually appears to be doing much worse in regard to the wound on her left great toe as compared to even my previous evaluation last week.  Unfortunately this seems to be training in [name] pretty poor direction. Her toe was actually now starting to angle laterally and I can actually see the entire joint area of the proximal portion of the digit where is the distal portion of the digit again is no longer even in contact with the joint line. Unfortunately there's [name] lot more necrotic tissue around the edge and the toe appears to be showing signs of becoming gangrenous in my pinion. I'm very concerned about were things stand at this point. She did see infectious disease and they are planning to send in [name] prescription for Sivextro for her and apparently this has been approved. With that being said I don't think she should avoid taking this but at the same time I'm not sure that it's gonna be sufficient to save her toe at this point. She tells me that she still having to care for grandparents which I think is putting quite [name] bit of strain on her foot and specifically the total area and has caused this to break down even to [name] greater degree than would've otherwise been expected. 03/05/19 on evaluation today patient actually appears to be doing quite well in regard to her toe all things considering. She still has bone exposed but there appears to be much less your thing on overall the appearance of the wound and the toe itself is dramatically improved. She still does have some issues currently obviously with infection she did see vascular as well and there concerned that her blood flow to the toad. For that reason they are setting up for an angiogram next week. 03/14/19 on evaluation today patient appears to be doing very poor in regard to her toe and specifically in regard to the ulceration and the fact that she's starting to notice the toe was leaning even more towards the lateral aspect and the complete joint is visible on the proximal aspect of the joint. Nonetheless she's also noted [name] significant odor and the tip of the toe is turning more dark and  necrotic appearing. Overall I think she is getting worse not better as far as this is concerned. For that reason I am recommending at this point that she likely needs to be seen for likely amputation. READMISSION 03/19/2021 This is [name] patient that we cared for in this clinic for [name] prolonged period of time in 2019 and 2020 with [name] left foot and left first toe wound. 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 Luana [name]. In December 2021 she had an ulcer on her right great toe as well as the fourth and fifth toes. 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 She also had an angiogram at that time and underwent angioplasty of the right anterior tibial artery. In any case she claims that the wound on the right foot is closed I did not look at this today which was probably an oversight although I think that should be done next week. 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 According to Dr. Deborra Medina last review that she was out of the area being cared for by another physician but recently came back to his attention. 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 [name] culture of this area showed E. coli apparently before she came back to see Dr. Luana [name] she was supposed to be receiving antibiotics but she did not really take them. Nor is she offloading this area at all. Finally her last hemoglobin A1c listed in epic was in March 2022 at 14.1 she says things are [name] lot better since then although I am not sure. 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 She had this wound at the time. During this admission she had E. coli sepsis [name] TEE was negative. Past medical history is extensive and includes type 2 diabetes with peripheral neuropathy  cardiomyopathy with an ejection fraction of 33%, hypertension, hyperlipidemia chronic renal failure stage III history of substance abuse with cocaine although she claims to be clean now verified by her mother. She is still [name] heavy cigarette smoker. She has [name] history of bipolar disorder seizure disorder ABI in our clinic was 1.05 6/1; left midfoot in the setting of [name] TMA done previously. Round circular wound with [name] "knuckle" of protruding tissue. The problem is that the knuckle was not attached to any of the surrounding granulation and this probed proximally widely I removed [name] large portion of this tissue. This wound goes with considerable undermining laterally. I do not feel any bone there was no purulence but this is [name] deep wound. 6/8; in spite of the debridement I did last week. She arrives with [name] wound looking exactly the same. [name] protruding "knuckle" of tissue nonadherent to most of the surrounding tissue. There is considerable depth around this from 6-12 o'clock at 2.7 cm and undermining of 1 cm. This does not look overtly infected and the x- ray I did last week was negative for any osseous abnormalities. We have been using silver collagen 6/15; deep tissue culture I did last week showed moderate staph aureus and moderate Pseudomonas. This will definitely require prolonged antibiotic therapy. The pathology on the protuberant area was negative for malignancy fungus etc. 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 We have been using silver collagen. I am going to be prescribing Levaquin for 2 weeks. Her CT scan of the foot is down for 7/5 6/22; CT scan of the foot on 7 5. She says she has hardware in the left leg from her previous fracture. She is on the Levaquin for the deep tissue culture I did that showed methicillin sensitive staph aureus and Pseudomonas. I gave her [name] 2-week supply and she will have another week. She arrives in clinic  today with the same protuberant tissue however this is  nonadherent to the tissue surrounding it. I am really at [name] loss to explain this unless there is underlying deep tissue infection 6/29; patient presents for 1 week follow-up. She has been using collagen to the wound bed. She reports taking her antibiotics as prescribed.She has no complaints or issues today. She denies signs of infection. 7/6; patient presents for one week followup. She has been using collagen to the wound bed. She states she is taking Levaquin however at times she is not able to keep it down. She denies signs of infection. 7/13; patient presents for 1 week follow-up. She has been using silver alginate to the wound bed. She still has nausea when taking her antibiotics. She denies signs of infection. 7/20; patient presents for 1 week follow-up. She has been using silver alginate with gentamicin cream to the wound bed. She denies any issues and has no complaints today. She denies signs of infection. 7/27; patient presents for 1 week follow-up. She continues to use silver alginate with gentamicin cream to the wound bed. She reports starting her antibiotics. She has no issues or complaints. Overall she reports stability to the wound. 8/3; patient presents for 1 week follow-up. She has been using silver alginate with gentamicin cream to the wound bed. She reports completing all antibiotics. She has no issues or complaints today. She denies signs of infection. 8/17; patient presents for 2-week follow-up. He is to use silver alginate to the wound bed. She has no issues or complaints today. She denies signs of infection. She reports her pain has improved in her foot since last clinic visit 8/24; patient presents for 1 week follow-up. She continues to use silver alginate to the wound bed. She has no issues or complaints. She denies signs of infection. Pain is stable. 9/7; patient presents for follow-up. She missed her last week  appointment due to feeling ill. She continues to use silver alginate. She has [name] new wound to the right lower extremity that is covered in eschar. She states It occurred over the past week and has no idea how it started. She currently denies signs of infection. 9/14; patient presents for follow-up. T the left foot wound she has been using gentamicin cream and silver alginate. T the right lower extremity wound she has o o been keeping this covered and has not obtain Santyl. 9/21; patient presents for follow-up. She reports using gentamicin cream and silver alginate to the left foot and Santyl to the right lower extremity wound. She has no issues or complaints today. She denies signs of infection. 9/28; patient presents for follow-up. She reports [name] new wound to her right heel. She states this occurred [name] few days ago and is progressively gotten worse. She has been trying to clean the area with [name] Q-tip and Santyl. She reports stability in the other 2 wounds. She has been using gentamicin cream and silver alginate to the left foot and Santyl to the right lower extremity wound. [name], [name] ([id number]) [id number] Page 7 of 22 10/12; patient presents for follow-up. She reports improvement to the wound beds. She is seeing vein and vascular to discuss the potential of [name] left BKA. She states they are going to do an arteriogram. She continues to use silver alginate with dressing changes to her wounds. 11/2; patient presents for follow-up. She states she has not been doing dressing changes to the wound beds. She states she is not able to offload the areas. She reports chronic pain to her left foot wound.  11/9; patient presents for follow-up. She came in with only socks on. She states she forgot to put on shoes. It is unclear if she is doing any dressing changes. She currently denies systemic signs of infection. 11/16; patient presents for follow-up. She came again only with  socks on. She states she does not wear shoes ever. It is unclear if she does dressing changes. She currently denies systemic signs of infection. 11/23; patient presents for follow-up. She wore her shoes today. It still unclear exactly what dressing she is using for each wound but she did states she obtained Dakin's solution and has been using this to the left foot wound. She currently denies signs of infection. 11/30; patient presents for follow-up. She has no issues or complaints today. She currently denies signs of infection. 12/7; patient presents for follow-up. She has no issues or complaints today. She has been using Hydrofera Blue to the right heel wound and Dakin solution to the left foot wound. Her right anterior leg wound is healed. She currently denies signs of infection. 12/14; patient presents for follow-up. She has been using Hydrofera Blue to the right heel and Dakin's to the left foot wounds. She has no issues or complaints today. She denies signs of infection. 12/21; patient presents for follow-up. She reports using Hydrofera Blue to the right heel and Dakin's to the left foot wound. She denies signs of infection. 12/28; patient presents for follow-up. She continues to use Dakin's to the left foot wound and Hydrofera Blue to the right heel wound. She denies signs of infection. 1/4; patient presents for follow-up. She has no issues or complaints today. She denies signs of infection. 1/11; patient presents for follow-up. It is unclear if she has been dressing these wounds over the past week. She currently denies signs of infection. 1/18; patient presents for follow-up. She states she has been using Dakin's wet-to-dry dressings to the left foot. She has been using Hydrofera Blue to the right foot foot wound. She states that the anterior right leg wound has reopened and draining serous fluid. She denies signs of infection. 1/25; patient presents for follow-up. She has no issues or  complaints today. 2/1; patient presents for follow-up. She has no issues or complaints today. She denies signs of infection. 2/8; patient presents for follow-up. She has lost her surgical shoes. She did not have [name] dressing to the right heel wound. She currently denies signs of infection. 2/15; patient presents for follow-up. She reports more pain to the right heel today. She denies purulent drainage Or fever/chills 2/22; patient presents for follow-up. She reports taking clindamycin over the past week. She states that she continues to have pain to her right heel. She reports purulent drainage. Readmission 03/16/2022 Ms. Tasmine Giesbrecht is [name] 47 year old female with [name] past medical history of type 2 diabetes, osteomyelitis to her feet, chronic systolic heart failure and bipolar disorder that presents to the clinic for bilateral feet wounds and right lower extremity wound. She was last seen in our clinic on 12/15/2021. At that time she had purulent drainage coming out of her right plantar foot and I recommended she go to the ED. She states she went to [facility] and has been there for the past 3 months. I cannot see the records. She states she had OR debridement and was on several weeks of IV antibiotics while inpatient. Since discharge she has not been taking care of the wound beds. She had nothing on her feet other than socks  today. She currently denies signs of infection. 5/31; patient presents for follow-up. She has been using Dakin's wet-to-dry dressings to the wound beds on her feet bilaterally and antibiotic ointment to the right anterior leg wound. She had [name] wound culture done at last clinic visit that showed moderate Pseudomonas aeruginosa sensitive to ciprofloxacin. She currently denies systemic signs of infection. 6/14; patient presents for follow-up. She received Keystone 5 days ago and has been using this on the wound beds. She states that last week she had to go to the hospital  because she had increased warmth and erythema to the right foot. She was started on 2 oral antibiotics. She states she has been taking these. She currently denies systemic signs of infection. She has no issues or complaints today. 6/21; patient presents for follow-up. She states she has been using Keystone antibiotics to the wound beds. She has no issues or complaints today. She denies signs of infection. 6/28; patient presents for follow-up. She has been using Keystone antibiotics to the wound beds. She has no issues or complaints today. 7/12; patient presents for follow-up. Has been using Keystone antibiotics to the wound beds with calcium alginate. She has no issues or complaints today. She never followed up with her orthopedic surgeon who did the OR debridement to the right foot. We discussed the total contact cast for the left foot and patient would like to do this next week. 7/19; patient presents for follow-up. She has been using Keystone antibiotics with calcium alginate to the wound beds. She has no issues or complaints today. Patient is in agreement to do the total contact cast of the left foot today. She knows to return later this week for the obligatory cast change. 05-13-2022 upon evaluation today patient's wound which she has the cast of the left leg actually appears to be doing significantly better. Fortunately I do not see any signs of active infection locally or systemically which is great news and overall I am extremely pleased with where we stand currently. 7/26; patient presents for follow-up. She has [name] cast in place for the past week. She states it irritated her shin. Other than that she tolerated the cast well. She states she would like [name] break for 1 week from the cast. We have been using Keystone antibiotic and Aquacel to both wound beds. She denies signs of infection. 8/2; patient presents for follow-up. She has been using Keystone and Aquacel to the wound beds. She denies any  issues and has no complaints. She is agreeable to have the cast placed today for the left leg. 06-03-2022 upon evaluation today patient appears to be doing well with regard to her wound she saw some signs of improvement which is great news. Fortunately I do not see any evidence of active infection locally or systemically at this time which is great news. No fevers, chills, nausea, vomiting, or diarrhea. 8/16; patient presents for follow-up. She has no issues or complaints today. We have been using Keystone and Aquacel to the wound beds. The left lower extremity is in [name] total contact cast. She is tolerated this well. 8/23; patient presents for follow-up. She has had the total contact cast on the left leg for the past week. Unfortunately this has rubbed and broken down the Leaton, Abeera [name] ([id number]) [phone].pdf Page 8 of 22 skin to the medial foot. She currently denies signs of infection. She has been using Keystone antibiotic to the right plantar foot wound. 8/30; patient presents for follow-up. We have  held off on the total contact cast for the left leg for the past week. Her wound on the left foot has improved and the previous surrounding breakdown of skin has epithelialized. She has been using Keystone antibiotic to both wound beds. She has no issues or complaints today. She denies signs of infection. 9/6; patient presents for follow-up. She has ordered her's Keystone antibiotic and this is arriving this week. She has been doing Dakin's wet-to-dry dressings to the wound beds. She denies signs of infection. She is agreeable to the total contact cast today. 9/13; patient presents for follow-up. She states that the cast caused her left leg shin to be sore. She would like to take [name] break from the cast this week. She has been using Keystone antibiotic to the right plantar foot wound. She denies signs of infection. 9/20; patient presents for follow-up. She has been using  Keystone antibiotics to the wound beds with calcium alginate to the right foot wound and Hydrofera Blue to the left foot wound. She is agreeable to having the cast placed today. She has been approved for Apligraf and we will order this for next clinic visit. 9/27; patient presents for follow-up. We have been using Keystone antibiotic with Hydrofera Blue to the left foot wound under [name] total contact cast. T the right o foot wound she has been using Keystone antibiotic and calcium alginate. She declines [name] total contact cast today. Apligraf is available for placement and she would like to proceed with this. 07-28-2022 upon evaluation today patient appears to be doing well currently in regard to her wound. She is actually showing signs of significant improvement which is great news. Fortunately I do not see any evidence of active infection locally nor systemically at this time. She has been seeing Dr. [place] and to be honest has been doing very well with the cast. Subsequently she comes in today with [name] cast on and we did reapply that today as well. She did not really want to she try to talk me out of that but I explained that if she wanted to heal this is really the right way to go. Patient voiced understanding. In regard to her right foot this is actually [name] lot better compared to the last time I saw her which is also great news. 10/11; patient presents for follow-up. Apligraf and the total contact cast was placed to the left leg at last clinic visit. She states that her right foot wound had burning pain to it with the placement of Apligraf to this area. She has been doing [place] over this area. She denies signs of infection including increased warmth, erythema or purulent drainage. 11/1; 3-week follow-up. The patient fortunately did not have [name] total contact cast or an Apligraf and on the left foot. She has been using Keystone ABD pads and kerlix and her own running shoes She arrives in clinic today  with thick callus and [name] very poor surface on the left foot on the right nonviable skin subcutaneous tissue and [name] deep probing hole. 11/15; patient missed her last clinic appointment. She states she has not been dressing the wound beds for the past 2 weeks. She states that at she had [name] new roommate but is now going back to live with her mother. Apparently its been [name] distracting 2 weeks. Patient currently denies signs of infection. 11/22; patient presents for follow-up. She states she has been using Keystone antibiotic and Dakin's wet-to-dry dressings to the wound beds. She is agreeable  for cast placement today. We had ordered Apligraf however this has not been received by our facility. 11/29; Patient had [name] total contact cast placed at last clinic visit and she tolerated this well. We were using silver alginate under the cast. Patient's been using Keystone antibiotic with Aquacel to the right plantar foot wound. She has no issues or complaints today. Apligraf is available for placement today. Patient would like to proceed with this. 12/6; patient presents for follow-up. She had Apligraf placed in standard fashion last clinic visit under the total contact cast to the left lower extremity. She has been using Keystone antibiotic and Aquacel to the right plantar foot wound. She has no issues or complaints today. 12/13; patient presents for follow-up. She has finished 5 Apligraf placements. Was told she would not qualify for more. We have been doing [name] total contact cast to the left lower extremity. She has been using Keystone antibiotic and Aquacel to the right plantar foot wound. She has no issues or complaints today. 12/20; patient presents for follow-up. We have been using Hydrofera Blue with Keystone antibiotic under [name] total contact cast of the left lower extremity. She reports using Keystone antibiotic and silver alginate to the right heel wound. She has no issues or complaints today. 12/27; patient  presents with [name] healthy wound on the left midfoot. We have Apligraf to apply that to that more also using [name] total contact cast. On the right we are using Keystone and silver alginate. She is offloading the right heel with [name] surgical shoealthough by her admission she is on her feet quite [name] bit 1/3; patient presents for follow-up. Apligraf was placed to the wound beds last clinic visit. She was placed in [name] total contact cast to the left lower extremity. She declines [name] total contact cast today. She states that her mother is in the hospital and she cannot adequately get around with the cast on. 1/10; patient presents for follow-up. She declined the total contact cast at last clinic visit. Both wounds have declined in appearance. She states that she has been on her feet and not offloading the wound beds. She currently denies signs of infection. 1/17; patient presents for follow-up. She had the total contact cast along with Apligraf placed last week to the left lower extremity. She tolerated this well. She has been using Aquacel Ag and Keystone antibiotic to the right heel wound. She currently denies signs of infection. She has no issues or complaints today. 1/24; patient presents for follow-up. We have been using Apligraf to the left foot wound along with [name] total contact cast. She has done well with this. T the right o heel wound she has been using Aquacel Ag and Keystone antibiotic ointment. She has no issues or complaints today. She denies signs of infection. 1/31; patient presents for follow-up. We have been using Apligraf to the left foot wound along with the total contact cast. She continues to do well with this. To the right heel we have been using Aquacel Ag and Keystone antibiotic ointment. She has no issues or complaints today. 12-01-2022 upon evaluation patient is seen today on my schedule due to the fact that she unfortunately was in the hospital yesterday. Her cast needed to come off the only  reason she is out of the hospital is due to the fact that they would not take it off at the hospital which is somewhat bewildered reading to me to be perfectly honest. I am not certain why this was  but either way she was released and then was placed on my schedule today in order to get this off and reapply the total contact casting as appropriate. I do not have an Apligraf for her today it was applied last week and today's actually expired yesterday as there was some scheduling conflicts with her being in the hospital. Nonetheless we do not have that for reapplication today but the good news that she is not draining too much and the Apligraf can go for up to 2 weeks so I am going to go ahead and reapply the total contact casting but we are going to leave the Apligraf in place. 2/14; patient presents for follow-up. T the left leg she has had the total contact cast and Apligraf for the past week. She has had no issues with this. T the o o right heel she has been using antibiotic ointment and Aquacel Ag. 2/21; patient presents for follow-up. We have been using Apligraf and [name] total contact cast to the left lower extremity. She is tolerated this well. Unfortunately she is not approved for any more Apligraf per insurance. She has been using antibiotic ointment and Aquacel Ag to the right foot. She has no issues or complaints today. Electronic Signature(s) Signed: 12/14/2022 11:45:14 AM By: Heather Shan DO Entered By: [name] on 12/14/2022 11:32:50 [name] ([id number]) [id number] Page 9 of 22 -------------------------------------------------------------------------------- Physical Exam Details Patient Name: Date of Service: [name], [name] 12/14/2022 11:00 [name]: [id number] Patient Account Number: [id number] Date of Birth/Sex: Treating RN: [birth year] (47 y.o. [name] Primary Care Provider: Raelene [name] Other Clinician:  Massie [name] Referring Provider: Treating Provider/Extender: [name] in Treatment: 16 Constitutional . Cardiovascular . Psychiatric . Notes Right foot: T the plantar heel there is an incision site with increased depth, No probing to bone. T the opening there is Nonviable tissue and granulation tissue. o o Left foot: T the medial aspect there is an open wound with granulation tissue and nonviable tissue. No overt signs of infection to any of the wound beds. o Electronic Signature(s) Signed: 12/14/2022 11:45:14 AM By: Heather Shan DO Entered By: [name] on 12/14/2022 11:33:14 -------------------------------------------------------------------------------- Physician Orders Details Patient Name: Date of Service: [name] 12/14/2022 11:00 [name]: [id number] Patient Account Number: [id number] Date of Birth/Sex: Treating RN: Mar 06, 1976 (47 y.o. [name] Primary Care Provider: Raelene [name] Other Clinician: Massie [name] Referring Provider: Treating Provider/Extender: [name] in Treatment: 58 Verbal / Phone Orders: No Diagnosis Coding Follow-up Appointments Return Appointment in 1 week. Nurse Visit as needed Bathing/ Shower/ Hygiene May shower with wound dressing protected with water repellent cover or cast protector. No tub bath. Anesthetic (Use 'Patient Medications' Section for Anesthetic Order Entry) Lidocaine applied to wound bed Veras, Miamor [name] ([id number]) [phone].pdf Page 10 of 22 Edema Control - Lymphedema / Segmental Compressive Device / Other Elevate, Exercise Daily and [name] void Standing for Long Periods of Time. Elevate legs to the level of the heart and pump ankles as often as possible Elevate leg(s) parallel to the floor when sitting. Off-Loading Total Contact Cast to Left Lower Extremity Open toe surgical shoe - Heel off loader -  Right foot Other: - keep pressure off of feet. Additional Orders / Instructions Follow Nutritious Diet and Increase Protein Intake Other: - Stay off of foot, wheelchair only Medications-Please add to medication list. Wound #11 Right,Plantar Foot Keystone Compound - use  at home ntibiotic - apply Gentamycin in office only Topical [name] Wound Treatment Wound #11 - Foot Wound Laterality: Plantar, Right Cleanser: Wound Cleanser 1 x Per Day/30 Days Discharge Instructions: Wash your hands with soap and water. Remove old dressing, discard into plastic bag and place into trash. Cleanse the wound with Wound Cleanser prior to applying [name] clean dressing using gauze sponges, not tissues or cotton balls. Do not scrub or use excessive force. Pat dry using gauze sponges, not tissue or cotton balls. Topical: Gentamicin 1 x Per Day/30 Days Discharge Instructions: Apply as directed by provider. in clinic Topical: keystone gel 1 x Per Day/30 Days Discharge Instructions: at home Prim Dressing: Aquacel Extra Hydrofiber Dressing, 2x2 (in/in) ary 1 x Per Day/30 Days Secondary Dressing: ABD Pad 5x9 (in/in) (Generic) 1 x Per Day/30 Days Discharge Instructions: Cover with ABD pad Secured With: Medipore T - 61M Medipore H Soft Cloth Surgical T ape ape, 2x2 (in/yd) 1 x Per Day/30 Days Secured With: [employer] Sterile or Non-Sterile 6-ply 4.5x4 (yd/yd) 1 x Per Day/30 Days Discharge Instructions: Apply Kerlix as directed Wound #12 - Foot Wound Laterality: Plantar, Left, Medial Cleanser: Wound Cleanser 1 x Per Day/30 Days Discharge Instructions: Wash your hands with soap and water. Remove old dressing, discard into plastic bag and place into trash. Cleanse the wound with Wound Cleanser prior to applying [name] clean dressing using gauze sponges, not tissues or cotton balls. Do not scrub or use excessive force. Pat dry using gauze sponges, not tissue or cotton balls. Topical: Gentamicin 1 x Per Day/30 Days Discharge  Instructions: Apply as directed by provider. in clinic Secondary Dressing: ABD Pad 5x9 (in/in) (Generic) 1 x Per Day/30 Days Discharge Instructions: Cover with ABD pad Secondary Dressing: Hydrofera Blue Ready Transfer Foam, 2.5x2.5 (in/in) 1 x Per Day/30 Days Discharge Instructions: Apply to wound bed over non-stick dressing. Secured With: Medipore T - 61M Medipore H Soft Cloth Surgical T ape ape, 2x2 (in/yd) 1 x Per Day/30 Days Secured With: [employer] Sterile or Non-Sterile 6-ply 4.5x4 (yd/yd) 1 x Per Day/30 Days Discharge Instructions: Apply Kerlix as directed Electronic Signature(s) Signed: 12/14/2022 11:45:14 AM By: Heather Shan DO Entered By: [name] on 12/14/2022 11:36:26 [name] ([id number]) [id number] Page 11 of 22 -------------------------------------------------------------------------------- Problem List Details Patient Name: Date of Service: [name], [name] 12/14/2022 11:00 [name]: [id number] Patient Account Number: [id number] Date of Birth/Sex: Treating RN: 04-06-1976 (47 y.o. 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 03/16/2022 No Yes E11.621 Type 2 diabetes mellitus with foot ulcer 03/16/2022 No Yes E11.42 Type 2 diabetes mellitus with diabetic polyneuropathy 03/16/2022 No Yes L97.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 03/16/2022 No Yes of skin Inactive Problems Resolved Problems Electronic Signature(s) Signed: 12/14/2022 11:45:14 AM By:  Heather Shan DO Entered By: [name] on 12/14/2022 11:32:00 -------------------------------------------------------------------------------- Progress Note Details Patient Name: Date of Service: [name] 12/14/2022 11:00 [name]: [id number] Patient Account Number: [id number] [name], ZIGLAR [name] ([id number]) [phone].pdf Page 12 of 22 Date of Birth/Sex: Treating RN: 1976-05-10 (47 y.o. [name] Primary Care Provider:  Other Clinician: Sherron Ales, Janace Hoard Referring Provider: Treating Provider/Extender: [name] in Treatment: 45 Subjective Chief Complaint Information obtained from Patient 03/19/2021; patient referred by Dr. Luana [name] who has been looking after her left foot for quite [name] period of time for review of [name] nonhealing area in the left midfoot 03/12/2022; bilateral feet wounds and right lower extremity wound. History of Present Illness (HPI) 01/18/18-She is here for initial evaluation of the left great toe ulcer. She is [name] poor historian in regards to timeframe in detail. She states approximately 4 weeks ago she lacerated her toe on something in the house. She followed up with her primary care who placed her on Bactrim and ultimately [name] second dose of Bactrim prior to coming to wound clinic. She states she has been treating the toe with peroxide, Betadine and [name] Band-Aid. She did not check her blood sugar this morning but checked it yesterday morning it was 327; she is unaware of [name] recent A1c and there are no current records. She saw Dr. she would've orthopedics last week for an old injury to the left ankle, she states he did not see her toe, nor did she bring it to his attention. She smokes approximately 1 pack cigarettes [name] day. Her social situation is concerning, she arrives this morning with her mother who appears extremely intoxicated/under the influence; her mother was asked to leave the  room and be monitored by the patient's grandmother. The patient's aunt then accompanied the patient and the room throughout the rest of the appointment. We had [name] lengthy discussion regarding the deleterious effects of uncontrolled hyperglycemia and smoking as it relates to wound healing and overall health. She was strongly encouraged to decrease her smoking and get her diabetes under better control. She states she is currently on [name] diet and has cut down her [facility] consumption. The left toe is erythematous, macerated and slightly edematous with malodor present. The edema in her left foot is below her baseline, there is no erythema streaking. We will treat her with Santyl, doxycycline; we have ordered and xray, culture and provided [name] Peg assist surgical shoe and cultured the wound. 01/25/18-She is here in follow-up evaluation for [name] left great toe ulcer and presents with an abscess to her suprapubic area. She states her blood sugars remain elevated, feeling "sick" and if levels are below 250, but she is trying. She has made no attempt to decrease her smoking stating that we "can't take away her food in her cigarettes". She has been compliant with offloading using the PEG assist you. She is using Santyl daily. the culture obtained last week grew staph aureus and Enterococcus faecalis; continues on the doxycycline and Augmentin was added on Monday. The suprapubic area has erythema, no femoral variation, purple discoloration, minimal induration, was accessed with [name] cotton tip applicator with sanguinopurulent drainage, this was cultured, I suspect the current antibiotic treatment will cover and we will not add anything to her current treatment plan. She was advised to go to urgent care or ER with any change in redness, induration or fever. 02/01/18-She is here in follow-up evaluation for left great toe ulcers and [name] new abdominal abscess from last week. She was able to use packing until earlier this week,  where she "forgot it was there". She states she was feeling ill with GI symptoms last week and was not taking her antibiotic. She states her glucose levels have been predominantly less than 200, with occasional levels between 200-250. She  thinks this was contributing to her GI symptoms as they have resolved without intervention. There continues to be significant laceration to left toe, otherwise it clinically looks stable/improved. There is now less superficial opening to the lateral aspect of the great toe that was residual blister. We will transition to [facility] to all wounds, she will continue her Augmentin. If there is no change or deterioration next week for reculture. 02/08/18-She is here in follow-up evaluation for left great toe ulcer and abdominal ulcer. There is an improvement in both wounds. She has been wrapping her left toe with coban, not by our direction, which has created an area of discoloration to the medial aspect; she has been advised to NOT use coban secondary to her neuropathy. She states her glucose levels have been high over this last week ranging from 200-350, she continues to smoke. She admits to being less compliant with her offloading shoe. We will continue with same treatment plan and she will follow-up next week. 02/15/18-She is here in follow-up evaluation for left great toe ulcer and abdominal ulcer. The abdominal ulcer is epithelialized. The left great toe ulcer is improved and all injury from last week using the Coban wrap is resolved, the lateral ulcer is healed. She admits to noncompliance with wearing offloading shoe and admits to glucose levels being greater than 300 most of the week. She continues to smoke and expresses no desire to quit. There is one area medially that probes deeper than it has historically, erythema to the toe and dorsal foot has consistently waxed and waned. There is no overt signs of cellulitis or infection but we will culture the wound for  any occult infection given the new area of depth and erythema. We will hold off on sensitivities for initiation of antibiotic therapy. 02/22/18-She is here in follow up evaluation for left great toe ulcer. There is overall significant improvement in both wound appearance, erythema and edema with changes made last week. She was not initiated on antibiotic therapy. Culture obtained last week showed oxacillin sensitive staph aureus, sensitive to clindamycin. Clindamycin has been called into the pharmacy but she has been instructed to hold off on initiation secondary to overall clinical improvement and her history of antibiotic intolerance. She has been instructed to contact the clinic with any noted changes/deterioration and the wound, erythema, edema and/or pain. She will follow-up next week. She continues to smoke and her glucose levels remain elevated >250; she admits to compliance with offloading shoe 03/01/18 on evaluation today patient appears to be doing fairly well in regard to her left first toe ulcer. She has been tolerating the dressing changes with the [facility] Dressing without complication and overall this has definitely showed signs of improvement according to records as well is what the patient tells me today. I'm very pleased in that regard. She is having no pain today 03/08/18 She is here for follow up evaluation of [name] left great toe ulcer. She remains non-compliant with glucose control and smoking cessation; glucose levels consistently >200. She states that she got new shoe inserts/peg assist. She admits to compliance with offloading. Since my last evaluation there is significant improvement. We will switch to prisma at this time and she will follow up next week. She is noted to be tachycardic at this appointment, heart rate 120s; she has [name] history of heart rate 70-130 according to our records. She admits to extreme agitation r/t personal issues; she was advised to monitor her heartrate  and contact her physician  if it does not return to [name] more normal range (<100). She takes cardizem twice daily. 03/15/18-She is here in follow-up evaluation for left great toe ulcer. She remains noncompliant with glucose control and smoking cessation. She admits to compliance with wearing offloading shoe. The ulcer is improved/stable and we will continue with the same treatment plan and she will follow-up next week 03/22/18-She is here for evaluation for left great toe ulcer. There continues to be significant improvement despite recurrent hyperglycemia (over 500 yesterday) and she continues to smoke. She has been compliant with offloading and we will continue with same treatment plan and she will follow-up next week. 03/29/18-She is here for evaluation for left great toe ulcer. Despite continuing to smoke and uncontrolled diabetes she continues to improve. She is compliant with offloading shoe. We will continue with the same treatment plan and she will follow-up next week 04/05/18- She is here in follow up evaluation for [name] left great toe ulcer; she presents with small pustule to left fifth toe (resembles ant bite). She admits to compliance with wearing offloading shoe; continues to smoke or have uncontrolled blood glucose control. There is more callus than usual with evidence of bleeding; she denies known trauma. 04/12/18-She is here for evaluation of left great toe ulcer. Despite noncompliance with glycemic control and smoking she continues to make improvement. She continues to wear offloading shoe. The pustule, that was identified last week, to the left fifth toe is resolved. She will follow-up in 2 weeks 05/03/18-she is seen in follow-up evaluation for [name] left great toe ulcer. She is compliant with offloading, otherwise noncompliant with glycemic control and smoking. She has plateaued and there is minimal improvement noted. We will transition to [facility], replaced the insert to her surgical shoe and  she will follow-up in one week 05/10/18- She is here in follow up evaluation for [name] left great toe ulcer. It appears stable despite measurement change. We will continue with same treatment plan and follow up next week. 05/24/18-She is seen in follow-up evaluation for [name] left great toe ulcer. She remains compliant with offloading, has made significant improvement in her diet, decreasing the amount of sugar/soda. She said her recent A1c was 10.9 which is lower than. She did see [name] diabetic nutritionist/educator yesterday. She continues to smoke. We will continue with the same treatment plan and she'll follow-up next week. 05/31/18- She is seen in follow-up evaluation for left great toe ulcer. She continues to remain compliant with offloading, continues to make improvement in her diet, increasing her water and decreasing the amount of sugar/soda. She does continue to smoke with no desire to quit. We will apply Prisma to the depth and Hydrofera Blue over. We have not received insurance authorization for oasis. She will follow up next week. 06/07/18-She is seen in follow-up evaluation for left great toe ulcer. It has stalled according to today's measurements although base appears stable. She says she saw [name] diabetic educator yesterday; her average blood sugars are less than 300 which is an improvement for her. She continues to smoke and states "that's my next step" She continues with water over soda. We will order for xray, culture and reinstate ace wrap compression prior to placing apligraf for next week. She is voicing no complaints or concerns. Her dressing will change to iodoflex over the next week in preparation for apligraf. 06/14/18-She is seen in follow-up evaluation for left great toe ulcer. Plain film x-ray performed last week was negative for osteomyelitis. Wound culture obtained Pitter,  Hayle [name] ([id number]) [id number] Page 13 of 22 last week grew strep B and OSSA; she is  initiated on keflex and cefdinir today; there is erythema to the toe which could be from ace wrap compression, she has [name] history of wrapping too tight and has has been encouraged to maintain ace wraps that we place today. We will hold off on application of apligraf today, will apply next week after antibiotic therapy has been initiated. She admits today that she has resumed taking [name] shower with her foot/toe submerged in water, she has been reminded to keep foot/toe out of the bath water. She will be seen in follow up next week 06/21/18-she is seen in follow-up evaluation for left great toe ulcer. She is tolerating antibiotic therapy with no GI disturbance. The wound is stable. Apligraf was applied today. She has been decreasing her smoking, only had 4 cigarettes yesterday and 1 today. She continues being more compliant in diabetic diet. She will follow-up next week for evaluation of site, if stable will remove at 2 weeks. 06/28/18- She is here in follow up evalution. Apligraf was placed last week, she states the dressing fell off on Tuesday and she was dressing with hydrofera blue. She is healed and will be discharged from the clinic today. She has been instructed to continue with smoking cessation, continue monitoring glucose levels, offloading for an additional 4 weeks and continue with hydrofera blue for additional two weeks for any possible microscopic opening. Readmission: 08/07/18 on evaluation today patient presents for reevaluation concerning the ulcer of her right great toe. She was previously discharged on 06/28/18 healed. Nonetheless she states that this began to show signs of drainage she subsequently went to her primary care provider. Subsequently an x-ray was performed on 08/01/18 which was negative. The patient was also placed on antibiotics at that time. Fortunately they should have been effective for the infection. Nonetheless she's been experiencing some improvement but still has [name] lot of  drainage coming from the wound itself. 08/14/18 on evaluation today patient's wound actually does show signs of improvement in regard to the erythema at this point. She has completed the antibiotics. With that being said we did discuss the possibility of placing her in [name] total contact cast as of today although I think that I may want to give this just [name] little bit more time to ensure nothing recurrence as far as her infection is concerned. I do not want to put in the cast and risk infection at that time if things are not completely resolved. With that being said she is gonna require some debridement today. 08/21/18 on evaluation today patient actually appears to be doing okay in regard to her toe ulcer. She's been tolerating the dressing changes without complication. With that being said it does appear that she is ready and in fact I think it's appropriate for [place] to go ahead and initiate the total contact cast today. Nonetheless she will require some sharp debridement to prepare the wound for application. Overall I feel like things have been progressing well but we do need to do something to get this to close more readily. 08/24/18 patient seen today for reevaluation after having had the total contact cast applied on Tuesday. She seems to have done very well the wound appears to be doing great and overall I'm pleased with the progress that she's made. There were no abnormal areas of rubbing from the cast on her lower extremity. 08/30/18 on evaluation today patient actually appears to  be completely healed in regard to her plantar toe ulcer. She tells me at this point she's been having [name] lot of issues with the cast. She almost fell [name] couple of times the state shall the step of her dog [name] couple times as well. This is been [name] very frustrating process for her other nonetheless she has completely healed the wound which is excellent news. Overall there does not appear to be the evidence of infection at this  time which is great news. 09/11/18 evaluation today patient presents for follow-up concerning her great toe ulcer on the left which has unfortunately reopened since I last saw her which was only [name] couple of weeks ago. Unfortunately she was not able to get in to get the shoe and potentially the AFO that's gonna be necessary due to her left foot drop. She continues with offloading shoe but this is not enough to prevent her from reopening it appears. When we last had her in the total contact cast she did well from [name] healing standpoint but unfortunately the wound reopened as soon as she came out of the cast within just [name] couple of weeks. 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 This is also part of the reason why she falls on [name] regular basis. I just do not believe that is gonna be safe for [place] to reinitiate the total contact cast as last time we had this on she fell 3 times one week which is definitely not normal for her. 09/18/18 upon evaluation today the patient actually appears to be doing about the same in regard to her toe ulcer. She did not contact [employer] as I asked her to even though I had given her the prescription. In fact she actually states that she has no idea where the prescription is. She did apparently call [employer] and they told her that all she needed to do was bring the prescription in order to be able to be seen and work on getting the AFO for her left foot. With all that being said she still does not have an appointment and I'm not sure were things stand that regard. I will give her [name] new prescription today in order to contact them to get this set up. 09/25/18 on evaluation today patient actually appears to be doing about the same in regard to her toes ulcer. She does have [name] small areas  which seems to have [name] lot of callous buildup around the edge of the wound which is going to need sharp debridement today. She still is waiting to be scheduled for evaluation with [employer] for possibility of an AFO. She states there supposed to call her tomorrow to get this set up. Unfortunately it does appear that her foot specifically the toe area is showing signs of erythema. There does not appear to be any systemic infection which is in these good news. 10/02/18 on evaluation today patient actually appears to be doing about the same in regard to her toe ulcer. This really has not done too well although it's not significantly larger it's also not significantly smaller. She has been tolerating the dressing changes without complication. She actually has her appointment with [employer] and [facility] tomorrow to hopefully be measured for obtaining and AFO splint. I think this  would be helpful preventing this from reoccurring. We had contemplated starting the cast this week although to be honest I am reluctant to do that as she's been having nausea, vomiting, and seizure activity over the past three days. She has [name] history of seizures and have been told is nothing that can be done for these. With that being said I do believe that along with the seizures have the nausea vomiting which upon further questioning doesn't seem to be the normal for her and makes me concerned for the possibility of infection or something else going on. I discussed this with the patient and her mother during the office visit today. I do not feel the wound is effective but maybe something else. The responses this was "this just happens to her at times and we don't know why". They did not seem to be interested in going to the hospital to have this checked out further. 10/09/18 on evaluation today patient presents for follow-up concerning her ongoing toe ulcer. She has been tolerating the dressing changes without complication. Fortunately  there does not appear to be any evidence of infection which is great news however I do think that the patient would benefit from going ahead for with the total contact cast. She's actually in [name] wheelchair today she tells me that she will use her walker if we initiate the cast. I was very specific about the fact that if we were gonna do the cast I wanted to make sure that she was using the walker in order to prevent any falls. She tells me she does not have stairs that she has to traverse on [name] regular basis at her home. She has not had any seizures since last week again that something that happens to her often she tells me she did talk to [employer] and they said that it may take up to three weeks to get the brace approved for her. Hopefully that will not take that long but nonetheless in the meantime I do think the cast could be of benefit. 10/12/18 on evaluation today patient appears to be doing rather well in regard to her toe ulcer. It's just been [name] few days and already this is significantly improved both as far as overall appearance and size. Fortunately there's no sign of infection. She is here for her first obligatory cast change. 10/19/18 Seen today for follow up and management of left great toe ulcer. Wound continues to show improvement. Noted small open area with seroussang drainage with palpation. Denies any increased pain or recent fevers during visit. She will continue calcium alginate with offloading shoe. Denies any questions or concerns during visit. 10/26/18 on evaluation today patient appears to be doing about the same as when I last saw her in regard to her wound bed. Fortunately there does not appear to be any signs of infection. Unfortunately she continues to have [name] breakdown in regard to the toe region any time that she is not in the cast. It takes almost no time at all for this to happen. Nonetheless she still has not heard anything from the brace being made by [employer] as to when  exactly this will be available to her. Fortunately there is no signs of infection at this time. 10/30/18 on evaluation today patient presents for application of the total contact cast as we just received him this morning. Fortunately we are gonna be able to apply this to her today which is great news. She continues to have no significant pain  which is good news. Overall I do feel like things have been improving while she was the cast is when she doesn't have [name] cast that things get worse. She still has not really heard anything from [facility] regarding her brace. 11/02/18 upon evaluation today patient's wound already appears to be doing significantly better which is good news. Fortunately there does not appear to be any signs of infection also good news. Overall I do think the total contact cast as before is helping to heal this area unfortunately it's just not gonna likely keep the KADINCE, SAEED [name] ([id number]) [id number] Page 14 of 22 area closed and healed without her getting her brace at least. Again the foot drop is [name] significant issue for her. 11/09/18 on evaluation today patient appears to be doing excellent in regard to her toe ulcer which in fact is completely healed. Fortunately we finally got the situation squared away with the paperwork which was needed to proceed with getting her brace approved by Medicaid. I have filled that out unfortunately that information has been sent to the orthopedic office that I worked at 2 1/2 years ago and not tired Current wound care measures. Fortunately she seems to be doing very well at this time. 11/23/18 on evaluation today patient appears to be doing More Poorly Compared to Last Time I Saw Her. At [facility] She Had Completely Healed. Currently she is continuing to have issues with reopening. She states that she just found out that the brace was approved through Medicaid now she just has to go get measured in order to have this  fitted for her and then made. Subsequently she does not have an appointment for this yet that is going to complicate things we obviously cannot put her back in the cast if we do not have everything measured because they're not gonna be able to measure her foot while she is in the cast. 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 Obviously this is unfortunate and does have me somewhat worried as well. 11/30/18 on evaluation today patient's toe ulcer actually appears to be doing fairly well. The good news is she will be getting her brace in the shoes next week on Wednesday. Hopefully we will be able to get this to heal without having to go back in the cast however she may need the cast in order to get the wound completely heal and then go from there. Fortunately there's no signs of infection at this time. 12/07/18 on evaluation today patient fortunately did receive her brace and she states she could tell this definitely makes her walk better. With that being said she's been having issues with her toe where she noticed yesterday there was [name] lot of tissue that was loosing off this appears to be much larger than what it was previous. She also states that her leg has been read putting much across the top of her foot just about the ankle although this seems to be receiving somewhat. The total area is still red and appears to be someone infected as best I can tell. She is previously taken Bactrim and that may be [name] good option for her today as well. We are gonna see what I wound culture shows as well and I think that this is definitely appropriate. With that being said outside of the culture I still need to initiate something in the interim  and that's what I'm gonna go ahead and select Bactrim is [name] good option for her. 12/14/18 on evaluation today patient appears to be doing better in regard to her left great toe ulcer  as compared to last week's evaluation. There's still some erythema although this is significantly improved which is excellent news. Overall I do believe that she is making good progress is still gonna take some time before she is where I would like her to be from the standpoint of being able to place her back into the total contact cast. Hopefully we will be where we need to be by next week. 12/21/18 on evaluation today patient actually appears to be doing poorly in regard to her toe ulcer. She's been tolerating the dressing changes without complication. Fortunately there's no signs of systemic infection although she does have [name] lot of drainage from the toe ulcer and this does seem to be causing some issues at this point. She does have erythema on the distal portion of her toe that appears to be likely cellulitis. 12/28/18 on evaluation today patient actually appears to be doing [name] little better in my pinion in regard to her toe ulcer. With that being said she still does have some evidence of infection at this time and for her culture she had both E. coli as well as enterococcus as organisms noted on evaluation. For that reason I think that though the Keflex likely has treated the E. coli rather well this has really done nothing for the enterococcus. We are going to have to initiate treatment for this specifically. 01/04/19 on evaluation today patient's toe actually appears to be doing better from the standpoint of infection. She currently would like to see about putting the cash back on I think that this is appropriate as long as she takes care of it and keeps it from getting wet. She is gonna have some drainage we can definitely pass this up with Drawtex and alginate to try to prevent as much drainage as possible from causing the problems. With that being said I do want to at least try her with the cast between now and Tuesday. If there any issues we can't continue to use it then I will discontinue the  use of the cast at that point. 01/08/19 on evaluation today patient actually appears to be doing very well as far as her foot ulcer specifically the great toe on the left is concerned. She did have an area of rubbing on the medial aspect of her left ankle which again is from the cast. Fortunately there's no signs of infection at this point in this appears to be [name] very slight skin breakdown. The patient tells me she felt it rubbing but didn't think it was that bad. Fortunately there is no signs of active infection at this time which is good news. No fevers, chills, nausea, or vomiting noted at this time. 01/15/19 on evaluation today patient actually appears to be doing well in regard to her toe ulcer. Again as previous she seems to do well and she has the cast on which indicates to me that during the time she doesn't have [name] cast on she's putting way too much pressure on this region. Obviously I think that's gonna be an issue as with the current national emergency concerning the Covid-19 Virus it has been recommended that we discontinue the use of total contact casting by the chief medical officer of our company, Dr. Simona Huh. The reasoning is that if [name] patient  becomes sick and cannot come into have the cast removed they could not just leave this on for an additional two weeks. Obviously the hospitals also do not want to receive patient's who are sick into the emergency department to potentially contaminate the region and spread the Covid-19 Virus among other sick individuals within the hospital system. Therefore at this point we are suspending the use of total contact cast until the current emergency subsides. This was all discussed with the patient today as well. 01/22/19 on evaluation today patient's wound on her left great toe appears to be doing slightly worse than previously noted last week. She tells me that she has been on this quite [name] bit in fact she tells me she's been awake for 38 straight hours.  This is due to the fact that she's having to care for grandparents because nobody else will. She has been taking care of them for five the last seven days since I've seen her they both have dementia his is from [name] stroke and her grandmother's was progressive. Nonetheless she states even her mom who knows her condition and situation has only help two of those days to take care of them she's been taking care of the rest. Fortunately there does not appear to be any signs of active infection in regard to her toe at this point although obviously it doesn't look as good as it did previous. I think this is directly related to her not taking off the pressure and friction by way of taking things easy. Though I completely understand what's going on. 01/29/19 on evaluation today patient's tools are actually appears to be showing some signs of improvement today compared to last week's evaluation as far as not necessarily the overall size of the wound but the fact that she has some new skin growth in between the two ends of the wound opening. Overall I feel like she has done well she states that she had [name] family member give her what sounds to be [name] CAM walker boot which has been helpful as well. 02/05/19 on evaluation today patient's wound bed actually appears to be doing significantly better in regard to her overall appearance of the size of the wound. With that being said she is still having an issue with offloading efficiently enough to get this to close. Apparently there is some signs of infection at this point as well unfortunately. Previously she's done well of Augmentin I really do not see anything that needs to be culture currently but there theme and cellulitis of the foot that I'm seeing I'm gonna go ahead and place her on an antibiotic today to try to help clear this up. 02/12/2019 on evaluation today patient actually appears to be doing poorly in regard to her overall wound status. She tells me she has been  using her offloading shoe but actually comes in today wearing her tennis shoe with the AFO brace. Again as I previously discussed with her this is really not sufficient to allow the area to heal appropriately. Nonetheless she continues to be somewhat noncompliant and I do wonder based on what she has told my nurse in the past as to whether or not [name] good portion of this noncompliance may be recreational drug and alcohol related. She has had [name] history of heroin overdose and this was fairly recently in the past couple of months that have been seeing her. Nonetheless overall I feel like her wound looks significantly worse today compared to what it was  previous. She still has significant erythema despite the Augmentin I am not sure that this is an appropriate medication for her infection I am also concerned that the infection may have gone down into her bone. 02/19/19 on evaluation today patient actually appears to be doing about the same in regard to her toe ulcer. Unfortunately she continues to show signs of bone exposure and infection at this point. There does not appear to be any evidence of worsening of the infection but I'm also not really sure that it's getting significantly better. She is on the Augmentin which should be sufficient for the Staphylococcus aureus infection that she has at this point. With that being said she may need IV antibiotics to more appropriately treat this. We did have [name] discussion today about hyperbaric option therapy. 02/28/19 on evaluation today patient actually appears to be doing much worse in regard to the wound on her left great toe as compared to even my previous evaluation last week. Unfortunately this seems to be training in [name] pretty poor direction. Her toe was actually now starting to angle laterally and I can actually see the entire joint area of the proximal portion of the digit where is the distal portion of the digit again is no longer even in contact with the joint  line. Unfortunately there's [name] lot more necrotic tissue around the edge and the toe appears to be showing signs of becoming gangrenous in my pinion. I'm very concerned about were things stand at this point. She did see infectious disease and they are planning to send in [name] prescription for Sivextro for her and [name], [name] ([id number]) [id number] Page 15 of 22 apparently this has been approved. With that being said I don't think she should avoid taking this but at the same time I'm not sure that it's gonna be sufficient to save her toe at this point. She tells me that she still having to care for grandparents which I think is putting quite [name] bit of strain on her foot and specifically the total area and has caused this to break down even to [name] greater degree than would've otherwise been expected. 03/05/19 on evaluation today patient actually appears to be doing quite well in regard to her toe all things considering. She still has bone exposed but there appears to be much less your thing on overall the appearance of the wound and the toe itself is dramatically improved. She still does have some issues currently obviously with infection she did see vascular as well and there concerned that her blood flow to the toad. For that reason they are setting up for an angiogram next week. 03/14/19 on evaluation today patient appears to be doing very poor in regard to her toe and specifically in regard to the ulceration and the fact that she's starting to notice the toe was leaning even more towards the lateral aspect and the complete joint is visible on the proximal aspect of the joint. Nonetheless she's also noted [name] significant odor and the tip of the toe is turning more dark and necrotic appearing. Overall I think she is getting worse not better as far as this is concerned. For that reason I am recommending at this point that she likely needs to be seen for likely  amputation. READMISSION 03/19/2021 This is [name] patient that we cared for in this clinic for [name] prolonged period of time in 2019 and 2020 with [name] left foot and left first toe wound. I believe she ultimately  became infected and underwent [name] left first toe amputation. 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 Luana [name]. In December 2021 she had an ulcer on her right great toe as well as the fourth and fifth toes. 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 She also had an angiogram at that time and underwent angioplasty of the right anterior tibial artery. In any case she claims that the wound on the right foot is closed I did not look at this today which was probably an oversight although I think that should be done next week. 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 According to Dr. Deborra Medina last review that she was out of the area being cared for by another physician but recently came back to his attention. 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 [name] culture of this area showed E. coli apparently before she came back to see Dr. Luana [name] she was supposed to be receiving antibiotics but she did not really take them. Nor is she offloading this area at all. Finally her last hemoglobin A1c listed in epic was in March 2022 at 14.1 she says things are [name] lot better since then although I am not sure. 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 She had this wound at the time. During this admission she had E. coli sepsis [name] TEE was negative. 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 She is still [name] heavy cigarette smoker. She has [name] history of bipolar disorder seizure disorder ABI in our clinic was 1.05 6/1; left midfoot in the setting of [name] TMA done previously. Round circular wound with [name] "knuckle" of protruding tissue. The problem is that the knuckle was not attached to any of the surrounding granulation and this probed proximally widely I removed [name] large portion of this tissue. This wound goes with considerable undermining laterally. I do not feel any bone there was no purulence but this is [name] deep wound. 6/8; in spite of the debridement I did last week. She arrives with [name] wound looking exactly the same. [name] protruding "knuckle" of tissue nonadherent to most of the surrounding tissue. There is considerable depth around this from 6-12 o'clock at 2.7 cm and undermining of 1 cm. This does not look overtly infected and the x- ray I did last week was negative for any osseous abnormalities. We have been using silver collagen 6/15; deep tissue culture I did last week showed moderate staph aureus and moderate Pseudomonas. This will definitely require prolonged antibiotic therapy. The pathology on the protuberant area was negative for malignancy fungus etc. 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 We have been using silver collagen. I am going to be prescribing Levaquin for 2 weeks. Her CT scan of the foot is down for 7/5 6/22; CT scan of the foot on 7 5. She says she has hardware in the left leg from her previous fracture. She is on the Levaquin for the deep tissue culture I did that showed methicillin sensitive staph aureus and Pseudomonas. I gave her [name] 2-week supply and  she will have another week. She arrives in clinic today with the same protuberant tissue however this is nonadherent to the tissue surrounding it. I am really at [name] loss to explain this unless there is underlying deep  tissue infection 6/29; patient presents for 1 week follow-up. She has been using collagen to the wound bed. She reports taking her antibiotics as prescribed.She has no complaints or issues today. She denies signs of infection. 7/6; patient presents for one week followup. She has been using collagen to the wound bed. She states she is taking Levaquin however at times she is not able to keep it down. She denies signs of infection. 7/13; patient presents for 1 week follow-up. She has been using silver alginate to the wound bed. She still has nausea when taking her antibiotics. She denies signs of infection. 7/20; patient presents for 1 week follow-up. She has been using silver alginate with gentamicin cream to the wound bed. She denies any issues and has no complaints today. She denies signs of infection. 7/27; patient presents for 1 week follow-up. She continues to use silver alginate with gentamicin cream to the wound bed. She reports starting her antibiotics. She has no issues or complaints. Overall she reports stability to the wound. 8/3; patient presents for 1 week follow-up. She has been using silver alginate with gentamicin cream to the wound bed. She reports completing all antibiotics. She has no issues or complaints today. She denies signs of infection. 8/17; patient presents for 2-week follow-up. He is to use silver alginate to the wound bed. She has no issues or complaints today. She denies signs of infection. She reports her pain has improved in her foot since last clinic visit 8/24; patient presents for 1 week follow-up. She continues to use silver alginate to the wound bed. She has no issues or complaints. She denies signs of infection. Pain is stable. 9/7; patient presents for follow-up. She missed her last week appointment due to feeling ill. She continues to use silver alginate. She has [name] new wound to the right lower extremity that is covered in eschar. She states It occurred over  the past week and has no idea how it started. She currently denies signs of infection. 9/14; patient presents for follow-up. T the left foot wound she has been using gentamicin cream and silver alginate. T the right lower extremity wound she has o o been keeping this covered and has not obtain Santyl. 9/21; patient presents for follow-up. She reports using gentamicin cream and silver alginate to the left foot and Santyl to the right lower extremity wound. She has no issues or complaints today. She denies signs of infection. 9/28; patient presents for follow-up. She reports [name] new wound to her right heel. She states this occurred [name] few days ago and is progressively gotten worse. She has been trying to clean the area with [name] Q-tip and Santyl. She reports stability in the other 2 wounds. She has been using gentamicin cream and silver alginate to the left foot and Santyl to the right lower extremity wound. 10/12; patient presents for follow-up. She reports improvement to the wound beds. She is seeing vein and vascular to discuss the potential of [name] left BKA. She states they are going to do an arteriogram. She continues to use silver alginate with dressing changes to her wounds. 11/2; patient presents for follow-up. She states she has not been doing dressing changes to the wound beds. She states she is not able to offload the  areas. She reports chronic pain to her left foot wound. 11/9; patient presents for follow-up. She came in with only socks on. She states she forgot to put on shoes. It is unclear if she is doing any dressing changes. She currently denies systemic signs of infection. 11/16; patient presents for follow-up. She came again only with socks on. She states she does not wear shoes ever. It is unclear if she does dressing changes. She currently denies systemic signs of infection. 11/23; patient presents for follow-up. She wore her shoes today. It still unclear exactly what dressing she is  using for each wound but she did states she obtained Dakin's solution and has been using this to the left foot wound. She currently denies signs of infection. MAEBY, LUHRSEN [name] ([id number]) [id number] Page 16 of 22 11/30; patient presents for follow-up. She has no issues or complaints today. She currently denies signs of infection. 12/7; patient presents for follow-up. She has no issues or complaints today. She has been using Hydrofera Blue to the right heel wound and Dakin solution to the left foot wound. Her right anterior leg wound is healed. She currently denies signs of infection. 12/14; patient presents for follow-up. She has been using Hydrofera Blue to the right heel and Dakin's to the left foot wounds. She has no issues or complaints today. She denies signs of infection. 12/21; patient presents for follow-up. She reports using Hydrofera Blue to the right heel and Dakin's to the left foot wound. She denies signs of infection. 12/28; patient presents for follow-up. She continues to use Dakin's to the left foot wound and Hydrofera Blue to the right heel wound. She denies signs of infection. 1/4; patient presents for follow-up. She has no issues or complaints today. She denies signs of infection. 1/11; patient presents for follow-up. It is unclear if she has been dressing these wounds over the past week. She currently denies signs of infection. 1/18; patient presents for follow-up. She states she has been using Dakin's wet-to-dry dressings to the left foot. She has been using Hydrofera Blue to the right foot foot wound. She states that the anterior right leg wound has reopened and draining serous fluid. She denies signs of infection. 1/25; patient presents for follow-up. She has no issues or complaints today. 2/1; patient presents for follow-up. She has no issues or complaints today. She denies signs of infection. 2/8; patient presents for follow-up. She has lost her  surgical shoes. She did not have [name] dressing to the right heel wound. She currently denies signs of infection. 2/15; patient presents for follow-up. She reports more pain to the right heel today. She denies purulent drainage Or fever/chills 2/22; patient presents for follow-up. She reports taking clindamycin over the past week. She states that she continues to have pain to her right heel. She reports purulent drainage. Readmission 03/16/2022 Ms. Gearldean Mccurty is [name] 47 year old female with [name] past medical history of type 2 diabetes, osteomyelitis to her feet, chronic systolic heart failure and bipolar disorder that presents to the clinic for bilateral feet wounds and right lower extremity wound. She was last seen in our clinic on 12/15/2021. At that time she had purulent drainage coming out of her right plantar foot and I recommended she go to the ED. She states she went to [facility] and has been there for the past 3 months. I cannot see the records. She states she had OR debridement and was on several weeks of IV antibiotics while inpatient. Since discharge  she has not been taking care of the wound beds. She had nothing on her feet other than socks today. She currently denies signs of infection. 5/31; patient presents for follow-up. She has been using Dakin's wet-to-dry dressings to the wound beds on her feet bilaterally and antibiotic ointment to the right anterior leg wound. She had [name] wound culture done at last clinic visit that showed moderate Pseudomonas aeruginosa sensitive to ciprofloxacin. She currently denies systemic signs of infection. 6/14; patient presents for follow-up. She received Keystone 5 days ago and has been using this on the wound beds. She states that last week she had to go to the hospital because she had increased warmth and erythema to the right foot. She was started on 2 oral antibiotics. She states she has been taking these. She currently denies systemic signs of  infection. She has no issues or complaints today. 6/21; patient presents for follow-up. She states she has been using Keystone antibiotics to the wound beds. She has no issues or complaints today. She denies signs of infection. 6/28; patient presents for follow-up. She has been using Keystone antibiotics to the wound beds. She has no issues or complaints today. 7/12; patient presents for follow-up. Has been using Keystone antibiotics to the wound beds with calcium alginate. She has no issues or complaints today. She never followed up with her orthopedic surgeon who did the OR debridement to the right foot. We discussed the total contact cast for the left foot and patient would like to do this next week. 7/19; patient presents for follow-up. She has been using Keystone antibiotics with calcium alginate to the wound beds. She has no issues or complaints today. Patient is in agreement to do the total contact cast of the left foot today. She knows to return later this week for the obligatory cast change. 05-13-2022 upon evaluation today patient's wound which she has the cast of the left leg actually appears to be doing significantly better. Fortunately I do not see any signs of active infection locally or systemically which is great news and overall I am extremely pleased with where we stand currently. 7/26; patient presents for follow-up. She has [name] cast in place for the past week. She states it irritated her shin. Other than that she tolerated the cast well. She states she would like [name] break for 1 week from the cast. We have been using Keystone antibiotic and Aquacel to both wound beds. She denies signs of infection. 8/2; patient presents for follow-up. She has been using Keystone and Aquacel to the wound beds. She denies any issues and has no complaints. She is agreeable to have the cast placed today for the left leg. 06-03-2022 upon evaluation today patient appears to be doing well with regard to her  wound she saw some signs of improvement which is great news. Fortunately I do not see any evidence of active infection locally or systemically at this time which is great news. No fevers, chills, nausea, vomiting, or diarrhea. 8/16; patient presents for follow-up. She has no issues or complaints today. We have been using Keystone and Aquacel to the wound beds. The left lower extremity is in [name] total contact cast. She is tolerated this well. 8/23; patient presents for follow-up. She has had the total contact cast on the left leg for the past week. Unfortunately this has rubbed and broken down the skin to the medial foot. She currently denies signs of infection. She has been using Keystone antibiotic to the right  plantar foot wound. 8/30; patient presents for follow-up. We have held off on the total contact cast for the left leg for the past week. Her wound on the left foot has improved and the previous surrounding breakdown of skin has epithelialized. She has been using Keystone antibiotic to both wound beds. She has no issues or complaints today. She denies signs of infection. 9/6; patient presents for follow-up. She has ordered her's Keystone antibiotic and this is arriving this week. She has been doing Dakin's wet-to-dry dressings to the wound beds. She denies signs of infection. She is agreeable to the total contact cast today. 9/13; patient presents for follow-up. She states that the cast caused her left leg shin to be sore. She would like to take [name] break from the cast this week. She has been using Keystone antibiotic to the right plantar foot wound. She denies signs of infection. 9/20; patient presents for follow-up. She has been using Keystone antibiotics to the wound beds with calcium alginate to the right foot wound and Hydrofera Blue to the left foot wound. She is agreeable to having the cast placed today. She has been approved for Apligraf and we will order this for next clinic  visit. [name], [name] ([id number]) [id number] Page 17 of 22 9/27; patient presents for follow-up. We have been using Keystone antibiotic with Hydrofera Blue to the left foot wound under [name] total contact cast. T the right o foot wound she has been using Keystone antibiotic and calcium alginate. She declines [name] total contact cast today. Apligraf is available for placement and she would like to proceed with this. 07-28-2022 upon evaluation today patient appears to be doing well currently in regard to her wound. She is actually showing signs of significant improvement which is great news. Fortunately I do not see any evidence of active infection locally nor systemically at this time. She has been seeing Dr. Heber Shelley and to be honest has been doing very well with the cast. Subsequently she comes in today with [name] cast on and we did reapply that today as well. She did not really want to she try to talk me out of that but I explained that if she wanted to heal this is really the right way to go. Patient voiced understanding. In regard to her right foot this is actually [name] lot better compared to the last time I saw her which is also great news. 10/11; patient presents for follow-up. Apligraf and the total contact cast was placed to the left leg at last clinic visit. She states that her right foot wound had burning pain to it with the placement of Apligraf to this area. She has been doing [place] over this area. She denies signs of infection including increased warmth, erythema or purulent drainage. 11/1; 3-week follow-up. The patient fortunately did not have [name] total contact cast or an Apligraf and on the left foot. She has been using Keystone ABD pads and kerlix and her own running shoes She arrives in clinic today with thick callus and [name] very poor surface on the left foot on the right nonviable skin subcutaneous tissue and [name] deep probing hole. 11/15; patient missed her last clinic  appointment. She states she has not been dressing the wound beds for the past 2 weeks. She states that at she had [name] new roommate but is now going back to live with her mother. Apparently its been [name] distracting 2 weeks. Patient currently denies signs of infection. 11/22; patient presents for follow-up.  She states she has been using Keystone antibiotic and Dakin's wet-to-dry dressings to the wound beds. She is agreeable for cast placement today. We had ordered Apligraf however this has not been received by our facility. 11/29; Patient had [name] total contact cast placed at last clinic visit and she tolerated this well. We were using silver alginate under the cast. Patient's been using Keystone antibiotic with Aquacel to the right plantar foot wound. She has no issues or complaints today. Apligraf is available for placement today. Patient would like to proceed with this. 12/6; patient presents for follow-up. She had Apligraf placed in standard fashion last clinic visit under the total contact cast to the left lower extremity. She has been using Keystone antibiotic and Aquacel to the right plantar foot wound. She has no issues or complaints today. 12/13; patient presents for follow-up. She has finished 5 Apligraf placements. Was told she would not qualify for more. We have been doing [name] total contact cast to the left lower extremity. She has been using Keystone antibiotic and Aquacel to the right plantar foot wound. She has no issues or complaints today. 12/20; patient presents for follow-up. We have been using Hydrofera Blue with Keystone antibiotic under [name] total contact cast of the left lower extremity. She reports using Keystone antibiotic and silver alginate to the right heel wound. She has no issues or complaints today. 12/27; patient presents with [name] healthy wound on the left midfoot. We have Apligraf to apply that to that more also using [name] total contact cast. On the right we are using Keystone and silver  alginate. She is offloading the right heel with [name] surgical shoealthough by her admission she is on her feet quite [name] bit 1/3; patient presents for follow-up. Apligraf was placed to the wound beds last clinic visit. She was placed in [name] total contact cast to the left lower extremity. She declines [name] total contact cast today. She states that her mother is in the hospital and she cannot adequately get around with the cast on. 1/10; patient presents for follow-up. She declined the total contact cast at last clinic visit. Both wounds have declined in appearance. She states that she has been on her feet and not offloading the wound beds. She currently denies signs of infection. 1/17; patient presents for follow-up. She had the total contact cast along with Apligraf placed last week to the left lower extremity. She tolerated this well. She has been using Aquacel Ag and Keystone antibiotic to the right heel wound. She currently denies signs of infection. She has no issues or complaints today. 1/24; patient presents for follow-up. We have been using Apligraf to the left foot wound along with [name] total contact cast. She has done well with this. T the right o heel wound she has been using Aquacel Ag and Keystone antibiotic ointment. She has no issues or complaints today. She denies signs of infection. 1/31; patient presents for follow-up. We have been using Apligraf to the left foot wound along with the total contact cast. She continues to do well with this. To the right heel we have been using Aquacel Ag and Keystone antibiotic ointment. She has no issues or complaints today. 12-01-2022 upon evaluation patient is seen today on my schedule due to the fact that she unfortunately was in the hospital yesterday. Her cast needed to come off the only reason she is out of the hospital is due to the fact that they would not take it off at the hospital  which is somewhat bewildered reading to me to be perfectly honest. I am not  certain why this was but either way she was released and then was placed on my schedule today in order to get this off and reapply the total contact casting as appropriate. I do not have an Apligraf for her today it was applied last week and today's actually expired yesterday as there was some scheduling conflicts with her being in the hospital. Nonetheless we do not have that for reapplication today but the good news that she is not draining too much and the Apligraf can go for up to 2 weeks so I am going to go ahead and reapply the total contact casting but we are going to leave the Apligraf in place. 2/14; patient presents for follow-up. T the left leg she has had the total contact cast and Apligraf for the past week. She has had no issues with this. T the o o right heel she has been using antibiotic ointment and Aquacel Ag. 2/21; patient presents for follow-up. We have been using Apligraf and [name] total contact cast to the left lower extremity. She is tolerated this well. Unfortunately she is not approved for any more Apligraf per insurance. She has been using antibiotic ointment and Aquacel Ag to the right foot. She has no issues or complaints today. Objective Constitutional Vitals Time Taken: 11:03 AM, Height: 69 in, Weight: 178 lbs, BMI: 26.3, Temperature: 98.7 F, Pulse: 67 bpm, Respiratory Rate: 16 breaths/min, Blood Pressure: 144/81 mmHg. General Notes: Right foot: T the plantar heel there is an incision site with increased depth, No probing to bone. T the opening there is Nonviable tissue and o o granulation tissue. Left foot: T the medial aspect there is an open wound with granulation tissue and nonviable tissue. No overt signs of infection to any of the o wound beds. Integumentary (Hair, Skin) Fiscus, Lottie [name] ([id number]) [phone].pdf Page 18 of 22 Wound #11 status is Open. Original cause of wound was Surgical Injury. The date acquired was: 12/01/2021. The  wound has been in treatment 39 weeks. The wound is located on the [name]. The wound measures 0.5cm length x 0.4cm width x 0.4cm depth; 0.157cm^2 area and 0.063cm^3 volume. There is Fat Layer (Subcutaneous Tissue) exposed. There is [name] medium amount of serosanguineous drainage noted. The wound margin is distinct with the outline attached to the wound base. There is medium (34-66%) pale granulation within the wound bed. There is [name] medium (34-66%) amount of necrotic tissue within the wound bed. Wound #12 status is Open. Original cause of wound was Pressure Injury. The date acquired was: 03/16/2020. The wound has been in treatment 39 weeks. The wound is located on the [place]. The wound measures 1.3cm length x 1.3cm width x 0.2cm depth; 1.327cm^2 area and 0.265cm^3 volume. There is Fat Layer (Subcutaneous Tissue) exposed. There is [name] medium amount of serous drainage noted. The wound margin is flat and intact. There is small (1- 33%) red, pink granulation within the wound bed. There is [name] large (67-100%) amount of necrotic tissue within the wound bed including Adherent Slough. 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 I debrided  nonviable tissue. I recommended Hydrofera Blue and antibiotic ointment to the left foot wound and continuing with antibiotic ointment and Aquacel Ag to the right heel. Continue the total contact cast to the left leg. Follow-up in 1 week. Procedures Wound #11 Pre-procedure diagnosis of Wound #11 is an Open Surgical Wound located on the Right,Plantar Foot . There was [name] Excisional  Skin/Subcutaneous Tissue Debridement with [name] total area of 0.5 sq cm performed by Heather Shan, MD. With the following instrument(s): Curette to remove Viable and Non-Viable tissue/material. Material removed includes Callus, Subcutaneous Tissue, and Slough. [name] time out was conducted at 11:28, prior to the start of the procedure. [name] Moderate amount of bleeding was controlled with Pressure. The procedure was tolerated well. Post Debridement Measurements: 0.5cm length x 0.4cm width x 0.4cm depth; 0.063cm^3 volume. Character of Wound/Ulcer Post Debridement is stable. Post procedure Diagnosis Wound #11: Same as Pre-Procedure Wound #12 Pre-procedure diagnosis of Wound #12 is [name] Diabetic Wound/Ulcer of the Lower Extremity located on the Left,Medial,Plantar Foot .Severity of Tissue Pre Debridement is: Fat layer exposed. There was [name] Selective/Open Wound Non-Viable Tissue Debridement with [name] total area of 4 sq cm performed by Heather Shan, MD. With the following instrument(s): Curette to remove Viable and Non-Viable tissue/material. Material removed includes Callus and Slough and. [name] time out was conducted at 11:26, prior to the start of the procedure. [name] Moderate amount of bleeding was controlled with Pressure. The procedure was tolerated well. Post Debridement Measurements: 1.3cm length x 1.6cm width x 0.1cm depth; 0.163cm^3 volume. Character of Wound/Ulcer Post Debridement is stable. Severity of Tissue Post Debridement is: Fat layer exposed. Post procedure Diagnosis Wound #12: Same as Pre-Procedure Pre-procedure diagnosis of Wound #12 is [name] Diabetic Wound/Ulcer of the Lower Extremity located on the Left,Medial,Plantar Foot . There was [name] T Contact otal Cast Procedure by Heather Shan, MD. Post procedure Diagnosis Wound #12: Same as Pre-Procedure Plan Follow-up Appointments: Return Appointment in 1 week. Nurse Visit as needed Bathing/ Shower/ Hygiene: May shower with wound dressing protected with  water repellent cover or cast protector. No tub bath. Anesthetic (Use 'Patient Medications' Section for Anesthetic Order Entry): Lidocaine applied to wound bed Edema Control - Lymphedema / Segmental Compressive Device / Other: Elevate, Exercise Daily and Avoid Standing for Long Periods of Time. Elevate legs to the level of the heart and pump ankles as often as possible Elevate leg(s) parallel to the floor when sitting. Off-Loading: T Contact Cast to Left Lower Extremity otal Open toe surgical shoe - Heel off loader - Right foot Other: - keep pressure off of feet. Additional Orders / Instructions: Follow Nutritious Diet and Increase Protein Intake Other: - Stay off of foot, wheelchair only Medications-Please add to medication list.: Wound #11 Right,Plantar Foot: CAMEREN, DEMELLO [name] ([id number]) [phone].pdf Page 19 of 22 Keystone Compound - use at home Topical Antibiotic - apply Gentamycin in office only WOUND #11: - Foot Wound Laterality: Plantar, Right Cleanser: Wound Cleanser 1 x Per Day/30 Days Discharge Instructions: Wash your hands with soap and water. Remove old dressing, discard into plastic bag and place into trash. Cleanse the wound with Wound Cleanser prior to applying [name] clean dressing using gauze sponges, not tissues or cotton balls. Do not scrub or use excessive force. Pat dry using gauze sponges, not tissue or cotton balls. Topical: Gentamicin 1 x Per Day/30 Days Discharge Instructions: Apply as directed by provider. in clinic Topical: keystone gel 1 x Per Day/30 Days Discharge Instructions: at home Prim Dressing: Aquacel Extra Hydrofiber  Dressing, 2x2 (in/in) 1 x Per Day/30 Days ary Secondary Dressing: ABD Pad 5x9 (in/in) (Generic) 1 x Per Day/30 Days Discharge Instructions: Cover with ABD pad Secured With: Medipore T - 2M Medipore H Soft Cloth Surgical T ape ape, 2x2 (in/yd) 1 x Per Day/30 Days Secured With: [employer] Sterile or Non-Sterile  6-ply 4.5x4 (yd/yd) 1 x Per Day/30 Days Discharge Instructions: Apply Kerlix as directed WOUND #12: - Foot Wound Laterality: Plantar, Left, Medial Cleanser: Wound Cleanser 1 x Per Day/30 Days Discharge Instructions: Wash your hands with soap and water. Remove old dressing, discard into plastic bag and place into trash. Cleanse the wound with Wound Cleanser prior to applying [name] clean dressing using gauze sponges, not tissues or cotton balls. Do not scrub or use excessive force. Pat dry using gauze sponges, not tissue or cotton balls. Topical: Gentamicin 1 x Per Day/30 Days Discharge Instructions: Apply as directed by provider. in clinic Secondary Dressing: ABD Pad 5x9 (in/in) (Generic) 1 x Per Day/30 Days Discharge Instructions: Cover with ABD pad Secondary Dressing: Hydrofera Blue Ready Transfer Foam, 2.5x2.5 (in/in) 1 x Per Day/30 Days Discharge Instructions: Apply to wound bed over non-stick dressing. Secured With: Medipore T - 2M Medipore H Soft Cloth Surgical T ape ape, 2x2 (in/yd) 1 x Per Day/30 Days Secured With: Kerlix Roll Sterile or Non-Sterile 6-ply 4.5x4 (yd/yd) 1 x Per Day/30 Days Discharge Instructions: Apply Kerlix as directed 1. In office sharp debridement 2. T contact cast placed in standard fashion to the left lower extremity otal 3. Aggressive offloadingoooffloading heel shoe to the right foot 4. Hydrofera Blue and antibiotic ointment - left foot 5. Aquacel Ag and antibiotic ointment - right foot Electronic Signature(s) Signed: 12/14/2022 11:45:14 AM By: Heather Shan DO Entered By: [name] on 12/14/2022 11:35:57 -------------------------------------------------------------------------------- ROS/PFSH Details Patient Name: Date of Service: [name], Donelle [name]. 12/14/2022 11:00 [name]: [id number] Patient Account Number: [id number] Date of Birth/Sex: Treating RN: 04-Sep-1976 (47 y.o. [name] Primary Care Provider: Raelene [name] Other  Clinician: Massie [name] Referring Provider: Treating Provider/Extender: [name] in Treatment: 39 Information Obtained From Patient Ear/Nose/Mouth/Throat Medical History: Positive for: Chronic sinus problems/congestion; Middle ear problems Hematologic/Lymphatic Medical History: Positive for: Anemia PERNA, [place] [name] ([id number]) [phone].pdf Page 20 of 22 Respiratory Medical History: Positive for: Chronic Obstructive Pulmonary Disease (COPD) Cardiovascular Medical History: Positive for: Congestive Heart Failure - EF 33% Endocrine Medical History: Positive for: Type II Diabetes Time with diabetes: 14 years Treated with: Insulin, Oral agents Blood sugar tested every day: No Blood sugar testing results: Bedtime: 176 Genitourinary Medical History: Positive for: End Stage Renal Disease Integumentary (Skin) Medical History: Positive for: History of pressure wounds Neurologic Medical History: Positive for: Neuropathy - feel and legs HBO Extended History Items Ear/Nose/Mouth/Throat: Ear/Nose/Mouth/Throat: Chronic sinus Middle ear problems problems/congestion Immunizations Pneumococcal Vaccine: Received Pneumococcal Vaccination: No Implantable Devices No devices added Family and Social History Current every day smoker; Marital Status - Single; Alcohol Use: Never; Drug Use: Prior History; Caffeine Use: Daily Electronic Signature(s) Signed: 12/14/2022 11:45:14 AM By: Heather Shan DO Signed: 12/15/2022 11:31:26 AM By: [name] Entered By: [name] on 12/14/2022 11:36:42 -------------------------------------------------------------------------------- Total Contact Cast Details Patient Name: Date of Service: [name] 12/14/2022 11:00 [name]: [id number] Patient Account Number: [id number] Date of Birth/Sex: Treating RN: 1976-08-22 (47 y.o. [name] Primary Care Provider: Raelene [name] Other Clinician: Dnyla, Gondek [name] ([id number]) [id number]  Page 21 of 22 Referring Provider: Treating Provider/Extender: [name] in Treatment: 39 T Contact Cast Applied for Wound Assessment: otal Wound #12 Left,Medial,Plantar Foot Performed By: Physician Heather Shan, MD Post Procedure Diagnosis Same as Pre-procedure Electronic Signature(s) Signed: 12/14/2022 11:45:14 AM By: Heather Shan DO Signed: 12/19/2022 9:50:02 AM By: [name] Entered By: [name] on 12/14/2022 11:29:34 -------------------------------------------------------------------------------- SuperBill Details Patient Name: Date of Service: Michel Harrow 12/14/2022 Medical Record Number: [id number] Patient Account Number: [id number] Date of Birth/Sex: Treating RN: Jul 01, 1976 (47 y.o. [name], [name] Primary Care Provider: Raelene [name] Other Clinician: Massie [name] Referring Provider: Treating Provider/Extender: [name] in Treatment: 39 Diagnosis Coding ICD-10 Codes Code Description [phone] Non-pressure chronic ulcer of other part of left foot with other specified severity 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.621 Type 2 diabetes mellitus with foot ulcer E11.42 Type 2 diabetes mellitus with diabetic polyneuropathy L97.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 of skin Facility Procedures : CPT4 Code: [id number] Description: [place] - DEB SUBQ TISSUE 20 SQ CM/< ICD-10 Diagnosis Description 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.621 Type 2 diabetes mellitus with foot ulcer Modifier: Quantity: 1 : CPT4 Code: [id number] Description: [id number] - DEBRIDE WOUND 1ST 20 SQ CM OR < ICD-10 Diagnosis Description L97.528  Non-pressure chronic ulcer of other part of left foot with other specified severi E11.621 Type 2 diabetes mellitus with foot ulcer Modifier: ty Quantity: 1 Physician Procedures : CPT4 Code Description Modifier [id number] - WC PHYS SUBQ TISS 20 SQ CM ICD-10 Diagnosis Description 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.621 Type 2 diabetes mellitus with foot ulcer Quantity: 1 : [id number] - WC PHYS DEBR WO ANESTH 20 SQ CM ICD-10 Diagnosis Description 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ity E11.621 Type 2 diabetes mellitus with foot ulcer Kaufhold, Fey [name] ([id number])  [id number] Quantity: 1 Page 22 of 22 Electronic Signature(s) Signed: 12/14/2022 11:45:14 AM By: Heather Shan DO Entered By: [name] on 12/14/2022 11:36:16

## 2022-12-19 NOTE — Progress Notes (Signed)
ASHANTII, BRAWDY A ([id number]) [id number] Page 1 of 11 Visit Report for 12/14/2022 Arrival Information Details Patient Name: Date of Service: SHERRY, ARROYO 12/14/2022 11:00 A M Medical Record Number: [id number] Patient Account Number: [id number] Date of Birth/Sex: Treating RN: 25-Apr-1976 (47 y.o. [name], [name] Primary Care Khamani Fairley: Raelene Bott Other Clinician: Massie Kluver Referring Bobbiejo Ishikawa: Treating Gifford Ballon/Extender: Eddie North in Treatment: 48 Visit Information History Since Last Visit All ordered tests and consults were completed: No Patient Arrived: Wheel Chair Added or deleted any medications: No Arrival Time: 11:02 Any new allergies or adverse reactions: No Transfer Assistance: None Had a fall or experienced change in No Patient Requires Transmission-Based Precautions: No activities of daily living that may affect Patient Has Alerts: No risk of falls: Signs or symptoms of abuse/neglect since last No visito Hospitalized since last visit: No Implantable device outside of the clinic No excluding cellular tissue based products placed in the center since last visit: Has Dressing in Place as Prescribed: Yes Has Footwear/Offloading in Place as Yes Prescribed: Left: Surgical Shoe with Pressure Relief Insole T Contact Cast otal Pain Present Now: No Electronic Signature(s) Signed: 12/19/2022 9:50:02 AM By: Massie Kluver Entered By: Massie Kluver on 12/14/2022 11:03:33 -------------------------------------------------------------------------------- Clinic Level of Care Assessment Details Patient Name: Date of Service: TIM, SEVY 12/14/2022 11:00 A M Medical Record Number: [id number] Patient Account Number: [id number] Date of Birth/Sex: Treating RN: 12/07/1975 (47 y.o. [name] Primary Care Floye Fesler: Raelene Bott Other Clinician: Massie Kluver Referring Zaccheus Edmister: Treating Vinetta Brach/Extender:  Eddie North in Treatment: [facility] Level of Care Assessment Items ELZORA, BLASKI A ([id number]) [id number] Page 2 of 11 TOOL 1 Quantity Score '[]'$  - 0 Use when EandM and Procedure is performed on INITIAL visit ASSESSMENTS - Nursing Assessment / Reassessment '[]'$  - 0 General Physical Exam (combine w/ comprehensive assessment (listed just below) when performed on new pt. evals) '[]'$  - 0 Comprehensive Assessment (HX, ROS, Risk Assessments, Wounds Hx, etc.) ASSESSMENTS - Wound and Skin Assessment / Reassessment '[]'$  - 0 Dermatologic / Skin Assessment (not related to wound area) ASSESSMENTS - Ostomy and/or Continence Assessment and Care '[]'$  - 0 Incontinence Assessment and Management '[]'$  - 0 Ostomy Care Assessment and Management (repouching, etc.) PROCESS - Coordination of Care '[]'$  - 0 Simple Patient / Family Education for ongoing care '[]'$  - 0 Complex (extensive) Patient / Family Education for ongoing care '[]'$  - 0 Staff obtains Programmer, systems, Records, T Results / Process Orders est '[]'$  - 0 Staff telephones HHA, Nursing Homes / Clarify orders / etc '[]'$  - 0 Routine Transfer to another Facility (non-emergent condition) '[]'$  - 0 Routine Hospital Admission (non-emergent condition) '[]'$  - 0 New Admissions / Biomedical engineer / Ordering NPWT Apligraf, etc. , '[]'$  - 0 Emergency Hospital Admission (emergent condition) PROCESS - Special Needs '[]'$  - 0 Pediatric / Minor Patient Management '[]'$  - 0 Isolation Patient Management '[]'$  - 0 Hearing / Language / Visual special needs '[]'$  - 0 Assessment of Community assistance (transportation, D/C planning, etc.) '[]'$  - 0 Additional assistance / Altered mentation '[]'$  - 0 Support Surface(s) Assessment (bed, cushion, seat, etc.) INTERVENTIONS - Miscellaneous '[]'$  - 0 External ear exam '[]'$  - 0 Patient Transfer (multiple staff / Civil Service fast streamer / Similar devices) '[]'$  - 0 Simple Staple / Suture removal (25 or less) '[]'$  -  0 Complex Staple / Suture removal (26 or more) '[]'$  - 0 Hypo/Hyperglycemic Management (do not check if billed separately) '[]'$  - 0 Ankle / Brachial Index (  ABI) - do not check if billed separately Has the patient been seen at the hospital within the last three years: Yes Total Score: 0 Level Of Care: ____ Electronic Signature(s) Signed: 12/19/2022 9:50:02 AM By: Massie Kluver Entered By: Massie Kluver on 12/14/2022 11:31:15 Nile Dear A ([id number]) [id number] Page 3 of 11 -------------------------------------------------------------------------------- Encounter Discharge Information Details Patient Name: Date of Service: JOLEA, ORAMAS 12/14/2022 11:00 A M Medical Record Number: [id number] Patient Account Number: [id number] Date of Birth/Sex: Treating RN: 11-23-1975 (47 y.o. [name] Primary Care Domenique Southers: Raelene Bott Other Clinician: Massie Kluver Referring Mayela Bullard: Treating Raymundo Rout/Extender: Eddie North in Treatment: 39 Encounter Discharge Information Items Post Procedure Vitals Discharge Condition: Stable Temperature (F): 98.7 Ambulatory Status: Wheelchair Pulse (bpm): 67 Discharge Destination: Home Respiratory Rate (breaths/min): 16 Transportation: Private Auto Blood Pressure (mmHg): 144/81 Accompanied By: mother Schedule Follow-up Appointment: Yes Clinical Summary of Care: Electronic Signature(s) Signed: 12/19/2022 9:50:02 AM By: Massie Kluver Entered By: Massie Kluver on 12/14/2022 12:07:16 -------------------------------------------------------------------------------- Lower Extremity Assessment Details Patient Name: Date of Service: YAITZA, LOR 12/14/2022 11:00 A M Medical Record Number: [id number] Patient Account Number: [id number] Date of Birth/Sex: Treating RN: [birth year] (47 y.o. [name] Primary Care Arland Usery: Raelene Bott Other Clinician: Massie Kluver Referring  Teal Raben: Treating Sudiksha Victor/Extender: Eddie North in Treatment: 6 Electronic Signature(s) Signed: 12/15/2022 11:31:26 AM By: Gretta Cool BSN, RN, CWS, [name] RN, BSN Signed: 12/19/2022 9:50:02 AM By: Massie Kluver Entered By: Massie Kluver on 12/14/2022 11:21:47 -------------------------------------------------------------------------------- Multi Wound Chart Details Patient Name: Date of Service: [name] 12/14/2022 11:00 A M Medical Record Number: [id number] Patient Account Number: [id number] Date of Birth/Sex: Treating RN: 1976/02/17 (47 y.o. [name] Primary Care Tomoko Sandra: Raelene Bott Other Clinician: Massie Kluver Referring Gimena Buick: Treating Cheick Suhr/Extender: Linna Hoff Weeks in Treatment: [address] Vital Signs MAHIKA, BRACKINS A ([id number]) [id number] Page 4 of 11 Height(in): 69 Pulse(bpm): 67 Weight(lbs): 178 Blood Pressure(mmHg): 144/81 Body Mass Index(BMI): 26.3 Temperature(F): 98.7 Respiratory Rate(breaths/min): 16 [11:Photos:] [N/A:N/A] Right, Plantar Foot Left, Medial, Plantar Foot N/A Wound Location: Surgical Injury Pressure Injury N/A Wounding Event: Open Surgical Wound 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: Extremity Chronic sinus problems/congestion, Chronic sinus problems/congestion, N/A Comorbid History: Middle ear problems, Anemia, Chronic Middle ear problems, Anemia, Chronic Obstructive Pulmonary Disease Obstructive Pulmonary Disease (COPD), Congestive Heart Failure, (COPD), Congestive Heart Failure, Type II Diabetes, End Stage Renal Type II Diabetes, End Stage Renal Disease, History of pressure wounds, Disease, History of pressure wounds, Neuropathy Neuropathy 12/01/2021 03/16/2020 N/A Date Acquired: 54 29 N/A Weeks of Treatment: Open Open N/A Wound Status: No No N/A Wound Recurrence: 0.5x0.4x0.4 1.3x1.3x0.2 N/A Measurements L x W x D (cm) 0.157  1.327 N/A A (cm) : rea 0.063 0.265 N/A Volume (cm) : 91.50% 71.80% N/A % Reduction in Area: 96.20% 71.90% N/A % Reduction in Volume: Full Thickness Without Exposed Grade 3 N/A Classification: Support Structures Medium Medium N/A Exudate Amount: Serosanguineous Serous N/A Exudate Type: red, brown amber N/A Exudate Color: Distinct, outline attached Flat and Intact N/A Wound Margin: Medium (34-66%) Small (1-33%) N/A Granulation Amount: Pale Red, Pink N/A Granulation Quality: Medium (34-66%) Large (67-100%) N/A Necrotic Amount: Fat Layer (Subcutaneous Tissue): Yes Fat Layer (Subcutaneous Tissue): Yes N/A Exposed Structures: Fascia: No Fascia: No Tendon: No Tendon: No Muscle: No Muscle: No Joint: No Joint: No Bone: No Bone: No Medium (34-66%) None N/A Epithelialization: Treatment Notes Electronic Signature(s) Signed: 12/19/2022 9:50:02 AM By: Massie Kluver Entered By: Clifton James,  Angie on 12/14/2022 11:26:02 -------------------------------------------------------------------------------- [place] Details Patient Name: Date of Service: MAKIRAH, CORAL 12/14/2022 11:00 [place] Record Number: [id number] Patient Account Number: [id number] GENEVIENE, DASHNER A ([id number]) [phone].pdf Page 5 of 11 Date of Birth/Sex: Treating RN: 10/28/1975 (47 y.o. [name] Primary Care Vinie Charity: Other Clinician: Glade Nurse Referring Sanchez Hemmer: Treating Ambera Fedele/Extender: Eddie North in Treatment: 59 Active Inactive Abuse / Safety / Falls / Self Care Management Nursing Diagnoses: History of Falls Potential for falls Potential for injury related to falls Goals: Patient will not develop complications from immobility Date Initiated: 03/17/2022 Date Inactivated: 04/20/2022 Target Resolution Date: 03/16/2022 Goal Status: Met Patient/caregiver will verbalize understanding of skin care  regimen Date Initiated: 03/17/2022 Date Inactivated: 09/28/2022 Target Resolution Date: 03/16/2022 Goal Status: Met Patient/caregiver will verbalize/demonstrate measure taken to improve self care Date Initiated: 03/17/2022 Target Resolution Date: 03/16/2022 Goal Status: Active Interventions: Assess fall risk on admission and as needed Provide education on basic hygiene Provide education on personal and home safety Notes: Necrotic Tissue Nursing Diagnoses: Impaired tissue integrity related to necrotic/devitalized tissue Knowledge deficit related to management of necrotic/devitalized tissue Goals: Necrotic/devitalized tissue will be minimized in the wound bed Date Initiated: 03/17/2022 Target Resolution Date: 10/26/2022 Goal Status: Active Patient/caregiver will verbalize understanding of reason and process for debridement of necrotic tissue Date Initiated: 03/17/2022 Date Inactivated: 09/28/2022 Target Resolution Date: 03/16/2022 Goal Status: Met Interventions: Provide education on necrotic tissue and debridement process Treatment Activities: Apply topical anesthetic as ordered : 03/16/2022 Excisional debridement : 03/16/2022 Notes: Osteomyelitis Nursing Diagnoses: Infection: osteomyelitis Knowledge deficit related to disease process and management Goals: Patient/caregiver will verbalize understanding of disease process and disease management Date Initiated: 03/17/2022 Target Resolution Date: 03/16/2022 Goal Status: Active Patient's osteomyelitis will resolve Date Initiated: 03/17/2022 Target Resolution Date: 10/26/2022 Goal Status: Active Signs and symptoms for osteomyelitis will be recognized and promptly addressed Date Initiated: 03/17/2022 Target Resolution Date: 10/26/2022 Goal Status: Active Interventions: Assess for signs and symptoms of osteomyelitis resolution every visit Provide education on osteomyelitis JAZZLEEN, SCHINK A ([id number]) [phone].pdf  Page 6 of 11 Treatment Activities: Systemic antibiotics : 03/16/2022 Notes: Patient on antibiotics. Pressure Nursing Diagnoses: Knowledge deficit related to management of pressures ulcers Potential for impaired tissue integrity related to pressure, friction, moisture, and shear Goals: Patient will remain free from development of additional pressure ulcers Date Initiated: 11/02/2022 Target Resolution Date: 11/09/2022 Goal Status: Active Patient/caregiver will verbalize risk factors for pressure ulcer development Date Initiated: 11/02/2022 Target Resolution Date: 11/02/2022 Goal Status: Active Patient/caregiver will verbalize understanding of pressure ulcer management Date Initiated: 11/02/2022 Target Resolution Date: 11/02/2022 Goal Status: Active Interventions: Assess: immobility, friction, shearing, incontinence upon admission and as needed Provide education on pressure ulcers Notes: Electronic Signature(s) Signed: 12/15/2022 11:31:26 AM By: Gretta Cool, BSN, RN, CWS, [name] RN, BSN Signed: 12/19/2022 9:50:02 AM By: Massie Kluver Entered By: Massie Kluver on 12/14/2022 12:06:02 -------------------------------------------------------------------------------- Pain Assessment Details Patient Name: Date of Service: [name] 12/14/2022 11:00 A M Medical Record Number: [id number] Patient Account Number: [id number] Date of Birth/Sex: Treating RN: 29-Aug-1976 (47 y.o. [name] Primary Care Christyann Manolis: Raelene Bott Other Clinician: Massie Kluver Referring Sonia Bromell: Treating Zikeria Keough/Extender: Eddie North in Treatment: 39 Active Problems Location of Pain Severity and Description of Pain Patient Has Paino No Site Locations [place], [place] A ([id number]) [id number] Page 7 of 11 Pain Management and Medication Current Pain Management: Electronic Signature(s) Signed: 12/15/2022 11:31:26 AM By: Gretta Cool, BSN, RN, CWS, [name] RN,  BSN Signed:  12/19/2022 9:50:02 AM By: Massie Kluver Entered By: Massie Kluver on 12/14/2022 11:07:17 -------------------------------------------------------------------------------- Patient/Caregiver Education Details Patient Name: Date of Service: Michel Harrow 2/21/2024andnbsp11:00 [place] Record Number: [id number] Patient Account Number: [id number] Date of Birth/Gender: Treating RN: 04/17/1976 (47 y.o. [name] Primary Care Physician: Raelene Bott Other Clinician: Massie Kluver Referring Physician: Treating Physician/Extender: Eddie North in Treatment: 55 Education Assessment Education Provided To: Patient Education Topics Provided Wound/Skin Impairment: Handouts: Other: continue wound care as directed Methods: Explain/Verbal Responses: State content correctly Electronic Signature(s) Signed: 12/19/2022 9:50:02 AM By: Massie Kluver Entered By: Massie Kluver on 12/14/2022 12:06:21 Nile Dear A ([id number]) [id number] Page 8 of 11 -------------------------------------------------------------------------------- Wound Assessment Details Patient Name: Date of Service: LIZANN, DOOL 12/14/2022 11:00 A M Medical Record Number: [id number] Patient Account Number: [id number] Date of Birth/Sex: Treating RN: 1976-01-01 (47 y.o. [name], [name] Primary Care Sashia Campas: Raelene Bott Other Clinician: Massie Kluver Referring Skylarr Liz: Treating Daveena Elmore/Extender: Linna Hoff Weeks in Treatment: 39 Wound Status Wound Number: 11 Primary Open Surgical Wound Etiology: Wound Location: Right, Plantar Foot Wound Open Wounding Event: Surgical Injury Status: Date Acquired: 12/01/2021 Comorbid Chronic sinus problems/congestion, Middle ear problems, Anemia, Weeks Of Treatment: 39 History: Chronic Obstructive Pulmonary Disease (COPD), Congestive Heart Clustered Wound: No Failure, Type II Diabetes, End Stage Renal  Disease, History of pressure wounds, Neuropathy Photos Wound Measurements Length: (cm) 0.5 Width: (cm) 0.4 Depth: (cm) 0.4 Area: (cm) 0.157 Volume: (cm) 0.063 % Reduction in Area: 91.5% % Reduction in Volume: 96.2% Epithelialization: Medium (34-66%) Wound Description Classification: Full Thickness Without Exposed Suppo Wound Margin: Distinct, outline attached Exudate Amount: Medium Exudate Type: Serosanguineous Exudate Color: red, brown rt Structures Foul Odor After Cleansing: No Slough/Fibrino Yes Wound Bed Granulation Amount: Medium (34-66%) Exposed Structure Granulation Quality: Pale Fascia Exposed: No Necrotic Amount: Medium (34-66%) Fat Layer (Subcutaneous Tissue) Exposed: Yes Tendon Exposed: No Muscle Exposed: No Joint Exposed: No Bone Exposed: No Treatment Notes Wound #11 (Foot) Wound Laterality: Plantar, Right Cleanser Wound Cleanser Discharge Instruction: Wash your hands with soap and water. Remove old dressing, discard into plastic bag and place into trash. Cleanse the DAMYA, HAWKES A ([id number]) [phone].pdf Page 9 of 11 wound with Wound Cleanser prior to applying a clean dressing using gauze sponges, not tissues or cotton balls. Do not scrub or use excessive force. Pat dry using gauze sponges, not tissue or cotton balls. Peri-Wound Care Topical Gentamicin Discharge Instruction: Apply as directed by Maiya Kates. in clinic keystone gel Discharge Instruction: at home Primary Dressing Aquacel Extra Hydrofiber Dressing, 2x2 (in/in) Secondary Dressing ABD Pad 5x9 (in/in) Discharge Instruction: Cover with ABD pad Secured With [place] Surgical T ape ape, 2x2 (in/yd) Kerlix Roll Sterile or Non-Sterile 6-ply 4.5x4 (yd/yd) Discharge Instruction: Apply Kerlix as directed Compression Wrap Compression Stockings Add-Ons Electronic Signature(s) Signed: 12/15/2022 11:31:26 AM By: Gretta Cool, BSN, RN, CWS, [name] RN,  BSN Signed: 12/19/2022 9:50:02 AM By: Massie Kluver Entered By: Massie Kluver on 12/14/2022 11:20:47 -------------------------------------------------------------------------------- Wound Assessment Details Patient Name: Date of Service: [name] 12/14/2022 11:00 A M Medical Record Number: [id number] Patient Account Number: [id number] Date of Birth/Sex: Treating RN: 06/08/1976 (47 y.o. [name] Primary Care Wilford Merryfield: Raelene Bott Other Clinician: Massie Kluver Referring Jacy Howat: Treating Celestine Prim/Extender: Linna Hoff Weeks in Treatment: 39 Wound Status Wound Number: 12 Primary Diabetic Wound/Ulcer of the Lower Extremity Etiology: Wound Location: Left, Medial, Plantar Foot Wound Open Wounding Event:  Pressure Injury Status: Date Acquired: 03/16/2020 Comorbid Chronic sinus problems/congestion, Middle ear problems, Anemia, Weeks Of Treatment: 39 History: Chronic Obstructive Pulmonary Disease (COPD), Congestive Heart Clustered Wound: No Failure, Type II Diabetes, End Stage Renal Disease, History of pressure wounds, Neuropathy Photos PRECIOSA, OHR A ([id number]) [phone].pdf Page 10 of 11 Wound Measurements Length: (cm) 1.3 Width: (cm) 1.3 Depth: (cm) 0.2 Area: (cm) 1.327 Volume: (cm) 0.265 % Reduction in Area: 71.8% % Reduction in Volume: 71.9% Epithelialization: None Wound Description Classification: Grade 3 Wound Margin: Flat and Intact Exudate Amount: Medium Exudate Type: Serous Exudate Color: amber Foul Odor After Cleansing: No Slough/Fibrino No Wound Bed Granulation Amount: Small (1-33%) Exposed Structure Granulation Quality: Red, Pink Fascia Exposed: No Necrotic Amount: Large (67-100%) Fat Layer (Subcutaneous Tissue) Exposed: Yes Necrotic Quality: Adherent Slough Tendon Exposed: No Muscle Exposed: No Joint Exposed: No Bone Exposed: No Treatment Notes Wound #12 (Foot) Wound Laterality: Plantar,  Left, Medial Cleanser Wound Cleanser Discharge Instruction: Wash your hands with soap and water. Remove old dressing, discard into plastic bag and place into trash. Cleanse the wound with Wound Cleanser prior to applying a clean dressing using gauze sponges, not tissues or cotton balls. Do not scrub or use excessive force. Pat dry using gauze sponges, not tissue or cotton balls. Peri-Wound Care Topical Gentamicin Discharge Instruction: Apply as directed by Lillian Ballester. in clinic Primary Dressing Secondary Dressing ABD Pad 5x9 (in/in) Discharge Instruction: Cover with ABD pad Hydrofera Blue Ready Transfer Foam, 2.5x2.5 (in/in) Discharge Instruction: Apply to wound bed over non-stick dressing. Secured With [place] H Soft Cloth Surgical T ape ape, 2x2 (in/yd) Kerlix Roll Sterile or Non-Sterile 6-ply 4.5x4 (yd/yd) Discharge Instruction: Apply Kerlix as directed Compression Wrap Compression Stockings Environmental education officer) Signed: 12/15/2022 11:31:26 AM By: Gretta Cool, BSN, RN, CWS, [name] RN, BSN Signed: 12/19/2022 9:50:02 AM By: Massie Kluver Entered By: Massie Kluver on 12/14/2022 11:21:26 Nile Dear A ([id number]) [id number] Page 11 of 11 -------------------------------------------------------------------------------- Vitals Details Patient Name: Date of Service: TERENCE, QUASHIE 12/14/2022 11:00 A M Medical Record Number: [id number] Patient Account Number: [id number] Date of Birth/Sex: Treating RN: April 09, 1976 (48 y.o. [name], [name] Primary Care Rozelle Caudle: Raelene Bott Other Clinician: Massie Kluver Referring Keylan Costabile: Treating Kymiah Araiza/Extender: Eddie North in Treatment: 39 Vital Signs Time Taken: 11:03 Temperature (F): 98.7 Height (in): 69 Pulse (bpm): 67 Weight (lbs): 178 Respiratory Rate (breaths/min): 16 Body Mass Index (BMI): 26.3 Blood Pressure (mmHg): 144/81 Reference Range: 80 - 120 mg  / dl Electronic Signature(s) Signed: 12/19/2022 9:50:02 AM By: Massie Kluver Entered By: Massie Kluver on 12/14/2022 11:07:12

## 2022-12-21 ENCOUNTER — Encounter (HOSPITAL_BASED_OUTPATIENT_CLINIC_OR_DEPARTMENT_OTHER): Payer: Medicaid Other | Admitting: Internal Medicine

## 2022-12-21 DIAGNOSIS — L97512 Non-pressure chronic ulcer of other part of right foot with fat layer exposed: Secondary | ICD-10-CM | POA: Diagnosis not present

## 2022-12-21 DIAGNOSIS — L97528 Non-pressure chronic ulcer of other part of left foot with other specified severity: Secondary | ICD-10-CM | POA: Diagnosis not present

## 2022-12-21 DIAGNOSIS — E11621 Type 2 diabetes mellitus with foot ulcer: Secondary | ICD-10-CM | POA: Diagnosis not present

## 2022-12-26 NOTE — Progress Notes (Signed)
SERRIA, SECOY Heather ([id number]) [phone].pdf Page 1 of 11 Visit Report for 12/21/2022 Arrival Information Details Patient Name: Date of Service: Heather Heather, Heather Heather 12/21/2022 11:00 Heather Heather Medical Record Number: [id number] Patient Account Number: [id number] Date of Birth/Sex: Treating RN: 08-28-1976 (47 y.o. Heather Heather Primary Care Heather Heather: Heather Heather Other Clinician: Massie [name] Referring Heather Heather: Treating Heather Heather/Extender: Heather Heather in Treatment: 38 Visit Information History Since Last Visit All ordered tests and consults were completed: No Patient Arrived: Wheel Chair Added or deleted any medications: No Arrival Time: 10:58 Any new allergies or adverse reactions: No Transfer Assistance: EasyPivot Patient Lift Had Heather fall or experienced change in No Patient Identification Verified: Yes activities of daily living that may affect Secondary Verification Process Completed: Yes risk of falls: Patient Requires Transmission-Based Precautions: No Signs or symptoms of abuse/neglect since last visito No Patient Has Alerts: No Hospitalized since last visit: No Implantable device outside of the clinic excluding No cellular tissue based products placed in the center since last visit: Has Dressing in Place as Prescribed: Yes Has Footwear/Offloading in Place as Prescribed: Yes Left: T Contact Cast otal Wedge Shoe Pain Present Now: No Electronic Signature(s) Signed: 12/23/2022 1:35:31 PM By: Heather Heather Entered By: Heather Heather on 12/21/2022 11:03:51 -------------------------------------------------------------------------------- Clinic Level of Care Assessment Details Patient Name: Date of Service: Heather Heather 12/21/2022 11:00 Heather Heather Medical Record Number: [id number] Patient Account Number: [id number] Date of Birth/Sex: Treating RN: 1975-12-09 (47 y.o. Heather Heather Primary Care Heather Heather: Heather Heather Other Clinician:  Massie [name] Referring Kyllian Clingerman: Treating Heather Heather/Extender: Heather Heather in Treatment: 40 Clinic Level of Care Assessment Items TOOL 1 Quantity Score '[]'$  - 0 Use when EandM and Procedure is performed on INITIAL visit ASSESSMENTS - Nursing Assessment / Reassessment Heather Heather, Heather Heather Heather ([id number]) [phone].pdf Page 2 of 11 '[]'$  - 0 General Physical Exam (combine w/ comprehensive assessment (listed just below) when performed on new pt. evals) '[]'$  - 0 Comprehensive Assessment (HX, ROS, Risk Assessments, Wounds Hx, etc.) ASSESSMENTS - Wound and Skin Assessment / Reassessment '[]'$  - 0 Dermatologic / Skin Assessment (not related to wound area) ASSESSMENTS - Ostomy and/or Continence Assessment and Care '[]'$  - 0 Incontinence Assessment and Management '[]'$  - 0 Ostomy Care Assessment and Management (repouching, etc.) PROCESS - Coordination of Care '[]'$  - 0 Simple Patient / Family Education for ongoing care '[]'$  - 0 Complex (extensive) Patient / Family Education for ongoing care '[]'$  - 0 Staff obtains Programmer, systems, Records, T Results / Process Orders est '[]'$  - 0 Staff telephones HHA, Nursing Homes / Clarify orders / etc '[]'$  - 0 Routine Transfer to another Facility (non-emergent condition) '[]'$  - 0 Routine Hospital Admission (non-emergent condition) '[]'$  - 0 New Admissions / Biomedical engineer / Ordering NPWT Apligraf, etc. , '[]'$  - 0 Emergency Hospital Admission (emergent condition) PROCESS - Special Needs '[]'$  - 0 Pediatric / Minor Patient Management '[]'$  - 0 Isolation Patient Management '[]'$  - 0 Hearing / Language / Visual special needs '[]'$  - 0 Assessment of Community assistance (transportation, D/C planning, etc.) '[]'$  - 0 Additional assistance / Altered mentation '[]'$  - 0 Support Surface(s) Assessment (bed, cushion, seat, etc.) INTERVENTIONS - Miscellaneous '[]'$  - 0 External ear exam '[]'$  - 0 Patient Transfer (multiple staff / Civil Service fast streamer / Similar  devices) '[]'$  - 0 Simple Staple / Suture removal (25 or less) '[]'$  - 0 Complex Staple / Suture removal (26 or more) '[]'$  - 0 Hypo/Hyperglycemic Management (do not check if billed separately) '[]'$  -  0 Ankle / Brachial Index (ABI) - do not check if billed separately Has the patient been seen at the hospital within the last three years: Yes Total Score: 0 Level Of Care: ____ Electronic Signature(s) Signed: 12/23/2022 1:35:31 PM By: Heather Heather Entered By: Heather Heather on 12/21/2022 11:41:27 -------------------------------------------------------------------------------- Encounter Discharge Information Details Patient Name: Date of Service: Heather Heather, [place]. 12/21/2022 11:00 Heather Heather Record Number: [id number] Patient Account Number: [id number] [name], [name] Heather ([id number]) [phone].pdf Page 3 of 11 Date of Birth/Sex: Treating RN: 02/12/1976 (47 y.o. Heather Heather Primary Care Heather Heather: Other Clinician: Glade [name] Referring Heather Heather: Treating Heather Heather/Extender: Heather Heather in Treatment: 40 Encounter Discharge Information Items Post Procedure Vitals Discharge Condition: Stable Temperature (F): 98.6 Ambulatory Status: Wheelchair Pulse (bpm): 59 Discharge Destination: Home Respiratory Rate (breaths/min): 16 Transportation: Private Auto Blood Pressure (mmHg): 138/26 Accompanied By: mother Schedule Follow-up Appointment: Yes Clinical Summary of Care: Electronic Signature(s) Signed: 12/23/2022 1:35:31 PM By: Heather Heather Entered By: Heather Heather on 12/21/2022 12:16:29 -------------------------------------------------------------------------------- Lower Extremity Assessment Details Patient Name: Date of Service: Heather Heather, Heather Heather 12/21/2022 11:00 Heather Heather Medical Record Number: [id number] Patient Account Number: [id number] Date of Birth/Sex: Treating RN: [birth year] (47 y.o. Heather Heather Primary Care Candon Caras:  Heather Heather Other Clinician: Massie [name] Referring Dywane Peruski: Treating Dae Antonucci/Extender: Heather Heather in Treatment: 40 Electronic Signature(s) Signed: 12/22/2022 6:12:13 PM By: Gretta Cool BSN, RN, CWS, [name] RN, BSN Signed: 12/23/2022 1:35:31 PM By: Heather Heather Entered By: Heather Heather on 12/21/2022 11:24:54 -------------------------------------------------------------------------------- Multi Wound Chart Details Patient Name: Date of Service: Heather Hitch Heather. 12/21/2022 11:00 Heather Heather Medical Record Number: [id number] Patient Account Number: [id number] Date of Birth/Sex: Treating RN: 1976/04/30 (47 y.o. Heather Heather Primary Care Brizeida Mcmurry: Heather Heather Other Clinician: Massie [name] Referring Bernadine Melecio: Treating Divina Neale/Extender: Heather Heather in Treatment: 40 Vital Signs Height(in): 69 Pulse(bpm): 59 Weight(lbs): 178 Blood Pressure(mmHg): 138/76 Body Mass Index(BMI): 26.3 Temperature(F): 98.6 Cardosa, Rosela Heather ([id number]) [id number] Page 4 of 11 Respiratory Rate(breaths/min): 16 [11:Photos:] [N/Heather:N/Heather] Right, Plantar Foot Left, Medial, Plantar Foot N/Heather Wound Location: Surgical Injury Pressure Injury N/Heather Wounding Event: Open Surgical Wound 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 Etiology: Extremity Chronic sinus problems/congestion, Chronic sinus problems/congestion, N/Heather Comorbid History: Middle ear problems, Anemia, Chronic Middle ear problems, Anemia, Chronic Obstructive Pulmonary Disease Obstructive Pulmonary Disease (COPD), Congestive Heart Failure, (COPD), Congestive Heart Failure, Type II Diabetes, End Stage Renal Type II Diabetes, End Stage Renal Disease, History of pressure wounds, Disease, History of pressure wounds, Neuropathy Neuropathy 12/01/2021 03/16/2020 N/Heather Date Acquired: 40 40 N/Heather Weeks of Treatment: Open Open N/Heather Wound Status: No No N/Heather Wound Recurrence: 0.6x0.6x0.4  1.1x1x0.1 N/Heather Measurements L x W x D (cm) 0.283 0.864 N/Heather Heather (cm) : rea 0.113 0.086 N/Heather Volume (cm) : 84.70% 81.70% N/Heather % Reduction in Area: 93.20% 90.90% N/Heather % Reduction in Volume: Full Thickness Without Exposed Grade 3 N/Heather Classification: Support Structures Medium Medium N/Heather Exudate Amount: Serosanguineous Serous N/Heather Exudate Type: red, brown amber N/Heather Exudate Color: Distinct, outline attached Flat and Intact N/Heather Wound Margin: Medium (34-66%) Small (1-33%) N/Heather Granulation Amount: Pale Red, Pink N/Heather Granulation Quality: Medium (34-66%) Large (67-100%) N/Heather Necrotic Amount: Fat Layer (Subcutaneous Tissue): Yes Fat Layer (Subcutaneous Tissue): Yes N/Heather Exposed Structures: Fascia: No Fascia: No Tendon: No Tendon: No Muscle: No Muscle: No Joint: No Joint: No Bone: No Bone: No Medium (34-66%) None N/Heather Epithelialization: Treatment Notes Electronic Signature(s) Signed: 12/23/2022 1:35:31 PM By:  Heather Heather Entered By: Heather Heather on 12/21/2022 11:25:04 -------------------------------------------------------------------------------- [place] Details Patient Name: Date of Service: Heather Heather, Heather Heather 12/21/2022 11:00 Heather Heather Medical Record Number: [id number] Patient Account Number: [id number] Date of Birth/Sex: Treating RN: 1976/06/20 (47 y.o. Heather Heather Primary Care Prescious Hurless: Heather Heather Other Clinician: Massie [name] Referring Sallee Hogrefe: Treating Amarys Sliwinski/Extender: Heather Heather in Treatment: [address], [place] ([id number]) [id number] Page 5 of 11 Active Inactive Abuse / Safety / Falls / Self Care Management Nursing Diagnoses: History of Falls Potential for falls Potential for injury related to falls Goals: Patient will not develop complications from immobility Date Initiated: 03/17/2022 Date Inactivated: 04/20/2022 Target Resolution Date: 03/16/2022 Goal Status: Met Patient/caregiver  will verbalize understanding of skin care regimen Date Initiated: 03/17/2022 Date Inactivated: 09/28/2022 Target Resolution Date: 03/16/2022 Goal Status: Met Patient/caregiver will verbalize/demonstrate measure taken to improve self care Date Initiated: 03/17/2022 Target Resolution Date: 03/16/2022 Goal Status: Active Interventions: Assess fall risk on admission and as needed Provide education on basic hygiene Provide education on personal and home safety Notes: Necrotic Tissue Nursing Diagnoses: Impaired tissue integrity related to necrotic/devitalized tissue Knowledge deficit related to management of necrotic/devitalized tissue Goals: Necrotic/devitalized tissue will be minimized in the wound bed Date Initiated: 03/17/2022 Target Resolution Date: 10/26/2022 Goal Status: Active Patient/caregiver will verbalize understanding of reason and process for debridement of necrotic tissue Date Initiated: 03/17/2022 Date Inactivated: 09/28/2022 Target Resolution Date: 03/16/2022 Goal Status: Met Interventions: Provide education on necrotic tissue and debridement process Treatment Activities: Apply topical anesthetic as ordered : 03/16/2022 Excisional debridement : 03/16/2022 Notes: Osteomyelitis Nursing Diagnoses: Infection: osteomyelitis Knowledge deficit related to disease process and management Goals: Patient/caregiver will verbalize understanding of disease process and disease management Date Initiated: 03/17/2022 Target Resolution Date: 03/16/2022 Goal Status: Active Patient's osteomyelitis will resolve Date Initiated: 03/17/2022 Target Resolution Date: 10/26/2022 Goal Status: Active Signs and symptoms for osteomyelitis will be recognized and promptly addressed Date Initiated: 03/17/2022 Target Resolution Date: 10/26/2022 Goal Status: Active Interventions: Assess for signs and symptoms of osteomyelitis resolution every visit Provide education on osteomyelitis Treatment  Activities: Systemic antibiotics : 03/16/2022 Notes: Heather Heather, Heather Heather ([id number]) [phone].pdf Page 6 of 11 Patient on antibiotics. Pressure Nursing Diagnoses: Knowledge deficit related to management of pressures ulcers Potential for impaired tissue integrity related to pressure, friction, moisture, and shear Goals: Patient will remain free from development of additional pressure ulcers Date Initiated: 11/02/2022 Target Resolution Date: 11/09/2022 Goal Status: Active Patient/caregiver will verbalize risk factors for pressure ulcer development Date Initiated: 11/02/2022 Target Resolution Date: 11/02/2022 Goal Status: Active Patient/caregiver will verbalize understanding of pressure ulcer management Date Initiated: 11/02/2022 Target Resolution Date: 11/02/2022 Goal Status: Active Interventions: Assess: immobility, friction, shearing, incontinence upon admission and as needed Provide education on pressure ulcers Notes: Electronic Signature(s) Signed: 12/22/2022 6:12:13 PM By: Gretta Cool, BSN, RN, CWS, [name] RN, BSN Signed: 12/23/2022 1:35:31 PM By: Heather Heather Entered By: Heather Heather on 12/21/2022 12:01:38 -------------------------------------------------------------------------------- Pain Assessment Details Patient Name: Date of Service: Heather Hitch Heather. 12/21/2022 11:00 [place] Record Number: [id number] Patient Account Number: [id number] Date of Birth/Sex: Treating RN: 09/13/1976 (47 y.o. Heather Heather Primary Care Hershel Corkery: Heather Heather Other Clinician: Massie [name] Referring Parth Mccormac: Treating Nehemiah Montee/Extender: Heather Heather in Treatment: 40 Active Problems Location of Pain Severity and Description of Pain Patient Has Paino No Site Locations [place], [place] Heather ([id number]) [id number] Page 7 of 11 Pain Management and Medication Current Pain Management: Electronic Signature(s) Signed: 12/22/2022  6:12:13 PM By:  Gretta Cool, BSN, RN, [employer], Leisure centre manager, BSN Signed: 12/23/2022 1:35:31 PM By: Heather Heather Entered By: Heather Heather on 12/21/2022 11:06:12 -------------------------------------------------------------------------------- Patient/Caregiver Education Details Patient Name: Date of Service: Michel Harrow 2/28/2024andnbsp11:00 [place] Record Number: [id number] Patient Account Number: [id number] Date of Birth/Gender: Treating RN: 09/22/1976 (47 y.o. Heather Heather Primary Care Physician: Heather Heather Other Clinician: Massie [name] Referring Physician: Treating Physician/Extender: Heather Heather in Treatment: 56 Education Assessment Education Provided To: Patient Education Topics Provided Wound/Skin Impairment: Handouts: Other: continue wound care as directed Methods: Explain/Verbal Responses: State content correctly Electronic Signature(s) Signed: 12/23/2022 1:35:31 PM By: Heather Heather Entered By: Heather Heather on 12/21/2022 12:02:02 -------------------------------------------------------------------------------- Wound Assessment Details Patient Name: Date of Service: Heather Hitch Heather. 12/21/2022 11:00 Heather Heather Medical Record Number: [id number] Patient Account Number: [id number] Date of Birth/Sex: Treating RN: 01-Jan-1976 (47 y.o. Heather Heather Primary Care Erlinda Solinger: Heather Heather Other Clinician: Massie [name] Referring Makisha Marrin: Treating Jerron Niblack/Extender: Linna Hoff Weeks in Treatment: [address] Wound Status Heather Heather, Heather Heather ([id number]) [id number] Page 8 of 11 Wound Number: 11 Primary Open Surgical Wound Etiology: Wound Location: Right, Plantar Foot Wound Open Wounding Event: Surgical Injury Status: Date Acquired: 12/01/2021 Comorbid Chronic sinus problems/congestion, Middle ear problems, Anemia, Weeks Of Treatment: 40 History: Chronic Obstructive Pulmonary Disease (COPD), Congestive  Heart Clustered Wound: No Failure, Type II Diabetes, End Stage Renal Disease, History of pressure wounds, Neuropathy Photos Wound Measurements Length: (cm) 0.6 Width: (cm) 0.6 Depth: (cm) 0.4 Area: (cm) 0.283 Volume: (cm) 0.113 % Reduction in Area: 84.7% % Reduction in Volume: 93.2% Epithelialization: Medium (34-66%) Wound Description Classification: Full Thickness Without Exposed Suppo Wound Margin: Distinct, outline attached Exudate Amount: Medium Exudate Type: Serosanguineous Exudate Color: red, brown rt Structures Foul Odor After Cleansing: No Slough/Fibrino Yes Wound Bed Granulation Amount: Medium (34-66%) Exposed Structure Granulation Quality: Pale Fascia Exposed: No Necrotic Amount: Medium (34-66%) Fat Layer (Subcutaneous Tissue) Exposed: Yes Tendon Exposed: No Muscle Exposed: No Joint Exposed: No Bone Exposed: No Treatment Notes Wound #11 (Foot) Wound Laterality: Plantar, Right Cleanser Wound Cleanser Discharge Instruction: Wash your hands with soap and water. Remove old dressing, discard into plastic bag and place into trash. Cleanse the wound with Wound Cleanser prior to applying Heather clean dressing using gauze sponges, not tissues or cotton balls. Do not scrub or use excessive force. Pat dry using gauze sponges, not tissue or cotton balls. Peri-Wound Care Topical Gentamicin Discharge Instruction: Apply as directed by Anaisha Mago. in clinic Primary Dressing Aquacel Extra Hydrofiber Dressing, 2x2 (in/in) Secondary Dressing ABD Pad 5x9 (in/in) Discharge Instruction: Cover with ABD pad Secured With [place] Surgical T ape ape, 2x2 (in/yd) Kerlix Roll Sterile or Non-Sterile 6-ply 4.5x4 (yd/yd) Discharge Instruction: Apply Kerlix as directed Compression [name] Marquiss, Anavey Heather ([id number]) [id number].pdf Page 9 of 11 Compression Stockings Add-Ons Electronic Signature(s) Signed: 12/22/2022 6:12:13 PM By: Gretta Cool,  BSN, RN, CWS, [name] RN, BSN Signed: 12/23/2022 1:35:31 PM By: Heather Heather Entered By: Heather Heather on 12/21/2022 11:23:56 -------------------------------------------------------------------------------- Wound Assessment Details Patient Name: Date of Service: Heather Hitch Heather. 12/21/2022 11:00 Heather Heather Medical Record Number: [id number] Patient Account Number: [id number] Date of Birth/Sex: Treating RN: 25-Mar-1976 (47 y.o. Heather Heather Primary Care Zaylen Susman: Heather Heather Other Clinician: Massie [name] Referring Traxton Kolenda: Treating Hisayo Delossantos/Extender: Linna Hoff Weeks in Treatment: 40 Wound Status Wound Number: 12 Primary Diabetic Wound/Ulcer of the Lower Extremity Etiology: Wound Location: Left, Medial, Plantar Foot Wound Open Wounding Event: Pressure  Injury Status: Date Acquired: 03/16/2020 Comorbid Chronic sinus problems/congestion, Middle ear problems, Anemia, Weeks Of Treatment: 40 History: Chronic Obstructive Pulmonary Disease (COPD), Congestive Heart Clustered Wound: No Failure, Type II Diabetes, End Stage Renal Disease, History of pressure wounds, Neuropathy Photos Wound Measurements Length: (cm) 1.1 Width: (cm) 1 Depth: (cm) 0.1 Area: (cm) 0.864 Volume: (cm) 0.086 % Reduction in Area: 81.7% % Reduction in Volume: 90.9% Epithelialization: None Wound Description Classification: Grade 3 Wound Margin: Flat and Intact Exudate Amount: Medium Exudate Type: Serous Exudate Color: amber Foul Odor After Cleansing: No Slough/Fibrino No Wound Bed Granulation Amount: Small (1-33%) Exposed Structure Granulation Quality: Red, Pink Fascia Exposed: No Necrotic Amount: Large (67-100%) Fat Layer (Subcutaneous Tissue) Exposed: Yes Necrotic Quality: Adherent Slough Tendon Exposed: No Heather Heather, Heather Heather ([id number]) [id number].pdf Page 10 of 11 Muscle Exposed: No Joint Exposed: No Bone Exposed: No Treatment Notes Wound #12 (Foot) Wound  Laterality: Plantar, Left, Medial Cleanser Wound Cleanser Discharge Instruction: Wash your hands with soap and water. Remove old dressing, discard into plastic bag and place into trash. Cleanse the wound with Wound Cleanser prior to applying Heather clean dressing using gauze sponges, not tissues or cotton balls. Do not scrub or use excessive force. Pat dry using gauze sponges, not tissue or cotton balls. Peri-Wound Care Topical Gentamicin Discharge Instruction: Apply as directed by Julez Huseby. in clinic Primary Dressing Hydrofera Blue Ready Transfer Foam, 2.5x2.5 (in/in) Discharge Instruction: Apply Hydrofera Blue Ready to wound bed as directed [place] 4 1/4x 4 1/4 (in/in) Discharge Instruction: Apply to periwound Secondary Dressing ABD Pad 5x9 (in/in) Discharge Instruction: Cover with ABD pad Secured With [place] Surgical T ape ape, 2x2 (in/yd) Kerlix Roll Sterile or Non-Sterile 6-ply 4.5x4 (yd/yd) Discharge Instruction: Apply Kerlix as directed Compression Wrap Compression Stockings Add-Ons Electronic Signature(s) Signed: 12/22/2022 6:12:13 PM By: Gretta Cool, BSN, RN, CWS, [name] RN, BSN Signed: 12/23/2022 1:35:31 PM By: Heather Heather Entered By: Heather Heather on 12/21/2022 11:24:42 -------------------------------------------------------------------------------- Vitals Details Patient Name: Date of Service: Heather Heather, Heather Heather. 12/21/2022 11:00 Heather Heather Medical Record Number: [id number] Patient Account Number: [id number] Date of Birth/Sex: Treating RN: Jun 17, 1976 (47 y.o. [name], [name] Primary Care Jodette Wik: Heather Heather Other Clinician: Massie [name] Referring Lavada Langsam: Treating Mckenzey Parcell/Extender: Heather Heather in Treatment: 40 Vital Signs Time Taken: 11:04 Temperature (F): 98.6 Height (in): 69 Pulse (bpm): 59 Weight (lbs): 178 Respiratory Rate (breaths/min): 16 Body Mass Index (BMI): 26.3 Blood Pressure (mmHg):  138/76 Reference Range: 80 - 120 mg / dl Heather Heather, Heather Heather ([id number]) [id number] Page 11 of 11 Electronic Signature(s) Signed: 12/23/2022 1:35:31 PM By: Heather Heather Entered By: Heather Heather on 12/21/2022 11:06:08

## 2022-12-26 NOTE — Progress Notes (Signed)
SHANDON, HODA A ([id number]) [phone].pdf Page 1 of 22 Visit Report for 12/21/2022 Chief Complaint Document Details Patient Name: Date of Service: JUNG, BURBACK 12/21/2022 11:00 A M Medical Record Number: [id number] Patient Account Number: [id number] Date of Birth/Sex: Treating RN: 11-10-1975 (47 y.o. [name] Primary Care Provider: Raelene Bott Other Clinician: Massie Kluver Referring Provider: Treating Provider/Extender: Eddie North in Treatment: 5 Information Obtained from: Patient Chief Complaint 03/19/2021; patient referred by Dr. Luana Shu who has been looking after her left foot for quite a period of time for review of a nonhealing area in the left midfoot 03/12/2022; bilateral feet wounds and right lower extremity wound. Electronic Signature(s) Signed: 12/21/2022 1:53:20 PM By: Kalman Shan DO Entered By: Kalman Shan on 12/21/2022 12:52:51 -------------------------------------------------------------------------------- Debridement Details Patient Name: Date of Service: [name] 12/21/2022 11:00 A M Medical Record Number: [id number] Patient Account Number: [id number] Date of Birth/Sex: Treating RN: 03-18-1976 (47 y.o. [name] Primary Care Provider: Raelene Bott Other Clinician: Massie Kluver Referring Provider: Treating Provider/Extender: Eddie North in Treatment: 40 Debridement Performed for Assessment: Wound #12 Left,Medial,Plantar Foot Performed By: Physician Kalman Shan, MD Debridement Type: Debridement Severity of Tissue Pre Debridement: Fat layer exposed Level of Consciousness (Pre-procedure): Awake and Alert Pre-procedure Verification/Time Out Yes - 11:30 Taken: Start Time: 11:30 T Area Debrided (L x W): otal 4 (cm) x 4 (cm) = 16 (cm) Tissue and other material debrided: Viable, Non-Viable, Callus, Slough, Slough Level: Non-Viable  Tissue Debridement Description: Selective/Open Wound Instrument: Curette Bleeding: Minimum Hemostasis Achieved: Pressure Response to Treatment: Procedure was tolerated well Level of Consciousness (Post- Awake and Alert procedure): LATARSHIA, CHRISTMAS A ([id number] Page 2 of 22 Post Debridement Measurements of Total Wound Length: (cm) 1.1 Width: (cm) 1 Depth: (cm) 0.1 Volume: (cm) 0.086 Character of Wound/Ulcer Post Debridement: Stable Severity of Tissue Post Debridement: Fat layer exposed Post Procedure Diagnosis Same as Pre-procedure Electronic Signature(s) Signed: 12/21/2022 12:14:48 PM By: Kalman Shan DO Signed: 12/22/2022 6:12:13 PM By: Gretta Cool, BSN, RN, CWS, [name] RN, BSN Signed: 12/23/2022 1:35:31 PM By: Massie Kluver Entered By: Massie Kluver on 12/21/2022 11:32:35 -------------------------------------------------------------------------------- Debridement Details Patient Name: Date of Service: [name] 12/21/2022 11:00 A M Medical Record Number: [id number] Patient Account Number: [id number] Date of Birth/Sex: Treating RN: 1976/02/24 (47 y.o. [name] Primary Care Provider: Raelene Bott Other Clinician: Massie Kluver Referring Provider: Treating Provider/Extender: Eddie North in Treatment: 40 Debridement Performed for Assessment: Wound #11 Right,Plantar Foot Performed By: Physician Kalman Shan, MD Debridement Type: Debridement Level of Consciousness (Pre-procedure): Awake and Alert Pre-procedure Verification/Time Out Yes - 11:33 Taken: Start Time: 11:33 T Area Debrided (L x W): otal 1 (cm) x 1 (cm) = 1 (cm) Tissue and other material debrided: Viable, Non-Viable, Callus, Slough, Subcutaneous, Slough Level: Skin/Subcutaneous Tissue Debridement Description: Excisional Instrument: Curette Bleeding: Minimum Hemostasis Achieved: Pressure Response to Treatment: Procedure was tolerated  well Level of Consciousness (Post- Awake and Alert procedure): Post Debridement Measurements of Total Wound Length: (cm) 0.6 Width: (cm) 0.6 Depth: (cm) 0.4 Volume: (cm) 0.113 Character of Wound/Ulcer Post Debridement: Stable Post Procedure Diagnosis Same as Pre-procedure Electronic Signature(s) Signed: 12/21/2022 12:14:48 PM By: Kalman Shan DO Signed: 12/22/2022 6:12:13 PM By: Gretta Cool, BSN, RN, CWS, [name] RN, BSN Signed: 12/23/2022 1:35:31 PM By: Massie Kluver Entered By: Massie Kluver on 12/21/2022 11:35:00 Nile Dear A ([id number]) [id number] Page 3 of 22 -------------------------------------------------------------------------------- HPI Details Patient Name: Date of Service: [name], [place]  12/21/2022 11:00 A M Medical Record Number: [id number] Patient Account Number: [id number] Date of Birth/Sex: Treating RN: [birth year] (47 y.o. [name] Primary Care Provider: Raelene Bott Other Clinician: Massie Kluver Referring Provider: Treating Provider/Extender: Eddie North in Treatment: 52 History of Present Illness HPI Description: 01/18/18-She is here for initial evaluation of the left great toe ulcer. She is a poor historian in regards to timeframe in detail. She states approximately 4 weeks ago she lacerated her toe on something in the house. She followed up with her primary care who placed her on Bactrim and ultimately a second dose of Bactrim prior to coming to wound clinic. She states she has been treating the toe with peroxide, Betadine and a Band-Aid. She did not check her blood sugar this morning but checked it yesterday morning it was 327; she is unaware of a recent A1c and there are no current records. She saw Dr. she would've orthopedics last week for an old injury to the left ankle, she states he did not see her toe, nor did she bring it to his attention. She smokes approximately 1 pack cigarettes a day. Her  social situation is concerning, she arrives this morning with her mother who appears extremely intoxicated/under the influence; her mother was asked to leave the room and be monitored by the patient's grandmother. The patient's aunt then accompanied the patient and the room throughout the rest of the appointment. We had a lengthy discussion regarding the deleterious effects of uncontrolled hyperglycemia and smoking as it relates to wound healing and overall health. She was strongly encouraged to decrease her smoking and get her diabetes under better control. She states she is currently on a diet and has cut down her [facility] consumption. The left toe is erythematous, macerated and slightly edematous with malodor present. The edema in her left foot is below her baseline, there is no erythema streaking. We will treat her with Santyl, doxycycline; we have ordered and xray, culture and provided a Peg assist surgical shoe and cultured the wound. 01/25/18-She is here in follow-up evaluation for a left great toe ulcer and presents with an abscess to her suprapubic area. She states her blood sugars remain elevated, feeling "sick" and if levels are below 250, but she is trying. She has made no attempt to decrease her smoking stating that we "can't take away her food in her cigarettes". She has been compliant with offloading using the PEG assist you. She is using Santyl daily. the culture obtained last week grew staph aureus and Enterococcus faecalis; continues on the doxycycline and Augmentin was added on Monday. The suprapubic area has erythema, no femoral variation, purple discoloration, minimal induration, was accessed with a cotton tip applicator with sanguinopurulent drainage, this was cultured, I suspect the current antibiotic treatment will cover and we will not add anything to her current treatment plan. She was advised to go to urgent care or ER with any change in redness, induration or  fever. 02/01/18-She is here in follow-up evaluation for left great toe ulcers and a new abdominal abscess from last week. She was able to use packing until earlier this week, where she "forgot it was there". She states she was feeling ill with GI symptoms last week and was not taking her antibiotic. She states her glucose levels have been predominantly less than 200, with occasional levels between 200-250. She thinks this was contributing to her GI symptoms as they have resolved without intervention. There continues to be significant  laceration to left toe, otherwise it clinically looks stable/improved. There is now less superficial opening to the lateral aspect of the great toe that was residual blister. We will transition to [facility] to all wounds, she will continue her Augmentin. If there is no change or deterioration next week for reculture. 02/08/18-She is here in follow-up evaluation for left great toe ulcer and abdominal ulcer. There is an improvement in both wounds. She has been wrapping her left toe with coban, not by our direction, which has created an area of discoloration to the medial aspect; she has been advised to NOT use coban secondary to her neuropathy. She states her glucose levels have been high over this last week ranging from 200-350, she continues to smoke. She admits to being less compliant with her offloading shoe. We will continue with same treatment plan and she will follow-up next week. 02/15/18-She is here in follow-up evaluation for left great toe ulcer and abdominal ulcer. The abdominal ulcer is epithelialized. The left great toe ulcer is improved and all injury from last week using the Coban wrap is resolved, the lateral ulcer is healed. She admits to noncompliance with wearing offloading shoe and admits to glucose levels being greater than 300 most of the week. She continues to smoke and expresses no desire to quit. There is one area medially that probes deeper than  it has historically, erythema to the toe and dorsal foot has consistently waxed and waned. There is no overt signs of cellulitis or infection but we will culture the wound for any occult infection given the new area of depth and erythema. We will hold off on sensitivities for initiation of antibiotic therapy. 02/22/18-She is here in follow up evaluation for left great toe ulcer. There is overall significant improvement in both wound appearance, erythema and edema with changes made last week. She was not initiated on antibiotic therapy. Culture obtained last week showed oxacillin sensitive staph aureus, sensitive to clindamycin. Clindamycin has been called into the pharmacy but she has been instructed to hold off on initiation secondary to overall clinical improvement and her history of antibiotic intolerance. She has been instructed to contact the clinic with any noted changes/deterioration and the wound, erythema, edema and/or pain. She will follow-up next week. She continues to smoke and her glucose levels remain elevated >250; she admits to compliance with offloading shoe 03/01/18 on evaluation today patient appears to be doing fairly well in regard to her left first toe ulcer. She has been tolerating the dressing changes with the [facility] Dressing without complication and overall this has definitely showed signs of improvement according to records as well is what the patient tells me today. I'm very pleased in that regard. She is having no pain today 03/08/18 She is here for follow up evaluation of a left great toe ulcer. She remains non-compliant with glucose control and smoking cessation; glucose levels consistently >200. She states that she got new shoe inserts/peg assist. She admits to compliance with offloading. Since my last evaluation there is significant improvement. We will switch to prisma at this time and she will follow up next week. She is noted to be tachycardic at this appointment,  heart rate 120s; she has a history of heart rate 70-130 according to our records. She admits to extreme agitation r/t personal issues; she was advised to monitor her heartrate and contact her physician if it does not return to a more normal range (<100). She takes cardizem twice daily. 03/15/18-She is here  in follow-up evaluation for left great toe ulcer. She remains noncompliant with glucose control and smoking cessation. She admits to compliance with wearing offloading shoe. The ulcer is improved/stable and we will continue with the same treatment plan and she will follow-up next week 03/22/18-She is here for evaluation for left great toe ulcer. There continues to be significant improvement despite recurrent hyperglycemia (over 500 yesterday) and she continues to smoke. She has been compliant with offloading and we will continue with same treatment plan and she will follow-up next week. 03/29/18-She is here for evaluation for left great toe ulcer. Despite continuing to smoke and uncontrolled diabetes she continues to improve. She is compliant with offloading shoe. We will continue with the same treatment plan and she will follow-up next week 04/05/18- She is here in follow up evaluation for a left great toe ulcer; she presents with small pustule to left fifth toe (resembles ant bite). She admits to compliance with wearing offloading shoe; continues to smoke or have uncontrolled blood glucose control. There is more callus than usual with evidence of bleeding; she denies known trauma. 04/12/18-She is here for evaluation of left great toe ulcer. Despite noncompliance with glycemic control and smoking she continues to make improvement. She continues to wear offloading shoe. The pustule, that was identified last week, to the left fifth toe is resolved. She will follow-up in 2 weeks 05/03/18-she is seen in follow-up evaluation for a left great toe ulcer. She is compliant with offloading, otherwise noncompliant  with glycemic control and Yzaguirre, Cathlin A ([id number]) [phone].pdf Page 4 of 22 smoking. She has plateaued and there is minimal improvement noted. We will transition to [facility], replaced the insert to her surgical shoe and she will follow-up in one week 05/10/18- She is here in follow up evaluation for a left great toe ulcer. It appears stable despite measurement change. We will continue with same treatment plan and follow up next week. 05/24/18-She is seen in follow-up evaluation for a left great toe ulcer. She remains compliant with offloading, has made significant improvement in her diet, decreasing the amount of sugar/soda. She said her recent A1c was 10.9 which is lower than. She did see a diabetic nutritionist/educator yesterday. She continues to smoke. We will continue with the same treatment plan and she'll follow-up next week. 05/31/18- She is seen in follow-up evaluation for left great toe ulcer. She continues to remain compliant with offloading, continues to make improvement in her diet, increasing her water and decreasing the amount of sugar/soda. She does continue to smoke with no desire to quit. We will apply Prisma to the depth and Hydrofera Blue over. We have not received insurance authorization for oasis. She will follow up next week. 06/07/18-She is seen in follow-up evaluation for left great toe ulcer. It has stalled according to today's measurements although base appears stable. She says she saw a diabetic educator yesterday; her average blood sugars are less than 300 which is an improvement for her. She continues to smoke and states "that's my next step" She continues with water over soda. We will order for xray, culture and reinstate ace wrap compression prior to placing apligraf for next week. She is voicing no complaints or concerns. Her dressing will change to iodoflex over the next week in preparation for apligraf. 06/14/18-She is seen in  follow-up evaluation for left great toe ulcer. Plain film x-ray performed last week was negative for osteomyelitis. Wound culture obtained last week grew strep B and OSSA; she is initiated  on keflex and cefdinir today; there is erythema to the toe which could be from ace wrap compression, she has a history of wrapping too tight and has has been encouraged to maintain ace wraps that we place today. We will hold off on application of apligraf today, will apply next week after antibiotic therapy has been initiated. She admits today that she has resumed taking a shower with her foot/toe submerged in water, she has been reminded to keep foot/toe out of the bath water. She will be seen in follow up next week 06/21/18-she is seen in follow-up evaluation for left great toe ulcer. She is tolerating antibiotic therapy with no GI disturbance. The wound is stable. Apligraf was applied today. She has been decreasing her smoking, only had 4 cigarettes yesterday and 1 today. She continues being more compliant in diabetic diet. She will follow-up next week for evaluation of site, if stable will remove at 2 weeks. 06/28/18- She is here in follow up evalution. Apligraf was placed last week, she states the dressing fell off on Tuesday and she was dressing with hydrofera blue. She is healed and will be discharged from the clinic today. She has been instructed to continue with smoking cessation, continue monitoring glucose levels, offloading for an additional 4 weeks and continue with hydrofera blue for additional two weeks for any possible microscopic opening. Readmission: 08/07/18 on evaluation today patient presents for reevaluation concerning the ulcer of her right great toe. She was previously discharged on 06/28/18 healed. Nonetheless she states that this began to show signs of drainage she subsequently went to her primary care provider. Subsequently an x-ray was performed on 08/01/18 which was negative. The patient was  also placed on antibiotics at that time. Fortunately they should have been effective for the infection. Nonetheless she's been experiencing some improvement but still has a lot of drainage coming from the wound itself. 08/14/18 on evaluation today patient's wound actually does show signs of improvement in regard to the erythema at this point. She has completed the antibiotics. With that being said we did discuss the possibility of placing her in a total contact cast as of today although I think that I may want to give this just a little bit more time to ensure nothing recurrence as far as her infection is concerned. I do not want to put in the cast and risk infection at that time if things are not completely resolved. With that being said she is gonna require some debridement today. 08/21/18 on evaluation today patient actually appears to be doing okay in regard to her toe ulcer. She's been tolerating the dressing changes without complication. With that being said it does appear that she is ready and in fact I think it's appropriate for [place] to go ahead and initiate the total contact cast today. Nonetheless she will require some sharp debridement to prepare the wound for application. Overall I feel like things have been progressing well but we do need to do something to get this to close more readily. 08/24/18 patient seen today for reevaluation after having had the total contact cast applied on Tuesday. She seems to have done very well the wound appears to be doing great and overall I'm pleased with the progress that she's made. There were no abnormal areas of rubbing from the cast on her lower extremity. 08/30/18 on evaluation today patient actually appears to be completely healed in regard to her plantar toe ulcer. She tells me at this point she's been having  a lot of issues with the cast. She almost fell a couple of times the state shall the step of her dog a couple times as well. This is been a very  frustrating process for her other nonetheless she has completely healed the wound which is excellent news. Overall there does not appear to be the evidence of infection at this time which is great news. 09/11/18 evaluation today patient presents for follow-up concerning her great toe ulcer on the left which has unfortunately reopened since I last saw her which was only a couple of weeks ago. Unfortunately she was not able to get in to get the shoe and potentially the AFO that's gonna be necessary due to her left foot drop. She continues with offloading shoe but this is not enough to prevent her from reopening it appears. When we last had her in the total contact cast she did well from a healing standpoint but unfortunately the wound reopened as soon as she came out of the cast within just a couple of weeks. 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 This is also part of the reason why she falls on a regular basis. I just do not believe that is gonna be safe for [place] to reinitiate the total contact cast as last time we had this on she fell 3 times one week which is definitely not normal for her. 09/18/18 upon evaluation today the patient actually appears to be doing about the same in regard to her toe ulcer. She did not contact [employer] as I asked her to even though I had given her the prescription. In fact she actually states that she has no idea where the prescription is. She did apparently call [employer] and they told her that all she needed to do was bring the prescription in order to be able to be seen and work on getting the AFO for her left foot. With all that being said she still does not have an appointment and I'm not sure were things stand that regard. I will give her a new prescription today in order  to contact them to get this set up. 09/25/18 on evaluation today patient actually appears to be doing about the same in regard to her toes ulcer. She does have a small areas which seems to have a lot of callous buildup around the edge of the wound which is going to need sharp debridement today. She still is waiting to be scheduled for evaluation with [employer] for possibility of an AFO. She states there supposed to call her tomorrow to get this set up. Unfortunately it does appear that her foot specifically the toe area is showing signs of erythema. There does not appear to be any systemic infection which is in these good news. 10/02/18 on evaluation today patient actually appears to be doing about the same in regard to her toe ulcer. This really has not done too well although it's not significantly larger it's also not significantly smaller. She has been tolerating the dressing changes without complication. She actually has her appointment with [employer] and [place] tomorrow to hopefully be measured for obtaining and AFO splint. I think this would be helpful preventing this from reoccurring. We had contemplated starting the cast this week although to be honest  I am reluctant to do that as she's been having nausea, vomiting, and seizure activity over the past three days. She has a history of seizures and have been told is nothing that can be done for these. With that being said I do believe that along with the seizures have the nausea vomiting which upon further questioning doesn't seem to be the normal for her and makes me concerned for the possibility of infection or something else going on. I discussed this with the patient and her mother during the office visit today. I do not feel the wound is effective but maybe something else. The responses this was "this just happens to her at times and we don't know why". They did not seem to be interested in going to the hospital to have this checked out  further. 10/09/18 on evaluation today patient presents for follow-up concerning her ongoing toe ulcer. She has been tolerating the dressing changes without complication. Fortunately there does not appear to be any evidence of infection which is great news however I do think that the patient would benefit from going ahead for with the total contact cast. She's actually in a wheelchair today she tells me that she will use her walker if we initiate the cast. I was very specific about the fact that if we were gonna do the cast I wanted to make sure that she was using the walker in order to prevent any falls. She tells me she does not have stairs that she has to traverse on a regular basis at her home. She has not had any seizures since last week again that something that happens to her often she tells me she did talk to [employer] and they said that it may take up to three weeks to get the brace approved for her. Hopefully that will not take that long but nonetheless in the meantime I do think the cast could be of benefit. 10/12/18 on evaluation today patient appears to be doing rather well in regard to her toe ulcer. It's just been a few days and already this is significantly improved both as far as overall appearance and size. Fortunately there's no sign of infection. She is here for her first obligatory cast change. MONIQUEA, STITELER A ([id number]) [phone].pdf Page 5 of 22 10/19/18 Seen today for follow up and management of left great toe ulcer. Wound continues to show improvement. Noted small open area with seroussang drainage with palpation. Denies any increased pain or recent fevers during visit. She will continue calcium alginate with offloading shoe. Denies any questions or concerns during visit. 10/26/18 on evaluation today patient appears to be doing about the same as when I last saw her in regard to her wound bed. Fortunately there does not appear to be any signs of  infection. Unfortunately she continues to have a breakdown in regard to the toe region any time that she is not in the cast. It takes almost no time at all for this to happen. Nonetheless she still has not heard anything from the brace being made by [employer] as to when exactly this will be available to her. Fortunately there is no signs of infection at this time. 10/30/18 on evaluation today patient presents for application of the total contact cast as we just received him this morning. Fortunately we are gonna be able to apply this to her today which is great news. She continues to have no significant pain which is good news. Overall I do feel like things  have been improving while she was the cast is when she doesn't have a cast that things get worse. She still has not really heard anything from [place] regarding her brace. 11/02/18 upon evaluation today patient's wound already appears to be doing significantly better which is good news. Fortunately there does not appear to be any signs of infection also good news. Overall I do think the total contact cast as before is helping to heal this area unfortunately it's just not gonna likely keep the area closed and healed without her getting her brace at least. Again the foot drop is a significant issue for her. 11/09/18 on evaluation today patient appears to be doing excellent in regard to her toe ulcer which in fact is completely healed. Fortunately we finally got the situation squared away with the paperwork which was needed to proceed with getting her brace approved by Medicaid. I have filled that out unfortunately that information has been sent to the orthopedic office that I worked at 2 1/2 years ago and not tired Current wound care measures. Fortunately she seems to be doing very well at this time. 11/23/18 on evaluation today patient appears to be doing More Poorly Compared to Last Time I Saw Her. At [facility] She Had Completely Healed. Currently she is  continuing to have issues with reopening. She states that she just found out that the brace was approved through Medicaid now she just has to go get measured in order to have this fitted for her and then made. Subsequently she does not have an appointment for this yet that is going to complicate things we obviously cannot put her back in the cast if we do not have everything measured because they're not gonna be able to measure her foot while she is in the cast. 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 Obviously this is unfortunate and does have me somewhat worried as well. 11/30/18 on evaluation today patient's toe ulcer actually appears to be doing fairly well. The good news is she will be getting her brace in the shoes next week on Wednesday. Hopefully we will be able to get this to heal without having to go back in the cast however she may need the cast in order to get the wound completely heal and then go from there. Fortunately there's no signs of infection at this time. 12/07/18 on evaluation today patient fortunately did receive her brace and she states she could tell this definitely makes her walk better. With that being said she's been having issues with her toe where she noticed yesterday there was a lot of tissue that was loosing off this appears to be much larger than what it was previous. She also states that her leg has been read putting much across the top of her foot just about the ankle although this seems to be receiving somewhat. The total area is still red and appears to be someone infected as best I can tell. She is previously taken Bactrim and that may be a good option for her today as well. We are gonna see what I wound culture shows as well and I think that this is definitely appropriate. With that being said outside of the culture I still need to initiate something in the interim  and that's what I'm gonna go ahead and select Bactrim is a good option for her. 12/14/18 on  evaluation today patient appears to be doing better in regard to her left great toe ulcer as compared to last week's evaluation. There's still some erythema although this is significantly improved which is excellent news. Overall I do believe that she is making good progress is still gonna take some time before she is where I would like her to be from the standpoint of being able to place her back into the total contact cast. Hopefully we will be where we need to be by next week. 12/21/18 on evaluation today patient actually appears to be doing poorly in regard to her toe ulcer. She's been tolerating the dressing changes without complication. Fortunately there's no signs of systemic infection although she does have a lot of drainage from the toe ulcer and this does seem to be causing some issues at this point. She does have erythema on the distal portion of her toe that appears to be likely cellulitis. 12/28/18 on evaluation today patient actually appears to be doing a little better in my pinion in regard to her toe ulcer. With that being said she still does have some evidence of infection at this time and for her culture she had both E. coli as well as enterococcus as organisms noted on evaluation. For that reason I think that though the Keflex likely has treated the E. coli rather well this has really done nothing for the enterococcus. We are going to have to initiate treatment for this specifically. 01/04/19 on evaluation today patient's toe actually appears to be doing better from the standpoint of infection. She currently would like to see about putting the cash back on I think that this is appropriate as long as she takes care of it and keeps it from getting wet. She is gonna have some drainage we can definitely pass this up with Drawtex and alginate to try to prevent as much drainage as possible from causing  the problems. With that being said I do want to at least try her with the cast between now and Tuesday. If there any issues we can't continue to use it then I will discontinue the use of the cast at that point. 01/08/19 on evaluation today patient actually appears to be doing very well as far as her foot ulcer specifically the great toe on the left is concerned. She did have an area of rubbing on the medial aspect of her left ankle which again is from the cast. Fortunately there's no signs of infection at this point in this appears to be a very slight skin breakdown. The patient tells me she felt it rubbing but didn't think it was that bad. Fortunately there is no signs of active infection at this time which is good news. No fevers, chills, nausea, or vomiting noted at this time. 01/15/19 on evaluation today patient actually appears to be doing well in regard to her toe ulcer. Again as previous she seems to do well and she has the cast on which indicates to me that during the time she doesn't have a cast on she's putting way too much pressure on this region. Obviously I think that's gonna be an issue as with the current national emergency concerning the Covid-19 Virus it has been recommended that we discontinue the use of total contact casting by the chief medical officer of our company, Dr. Simona Huh. The reasoning is that if a patient becomes sick and cannot come into have the cast removed they could not just leave this on for an  additional two weeks. Obviously the hospitals also do not want to receive patient's who are sick into the emergency department to potentially contaminate the region and spread the Covid-19 Virus among other sick individuals within the hospital system. Therefore at this point we are suspending the use of total contact cast until the current emergency subsides. This was all discussed with the patient today as well. 01/22/19 on evaluation today patient's wound on her left great toe  appears to be doing slightly worse than previously noted last week. She tells me that she has been on this quite a bit in fact she tells me she's been awake for 38 straight hours. This is due to the fact that she's having to care for grandparents because nobody else will. She has been taking care of them for five the last seven days since I've seen her they both have dementia his is from a stroke and her grandmother's was progressive. Nonetheless she states even her mom who knows her condition and situation has only help two of those days to take care of them she's been taking care of the rest. Fortunately there does not appear to be any signs of active infection in regard to her toe at this point although obviously it doesn't look as good as it did previous. I think this is directly related to her not taking off the pressure and friction by way of taking things easy. Though I completely understand what's going on. 01/29/19 on evaluation today patient's tools are actually appears to be showing some signs of improvement today compared to last week's evaluation as far as not necessarily the overall size of the wound but the fact that she has some new skin growth in between the two ends of the wound opening. Overall I feel like she has done well she states that she had a family member give her what sounds to be a CAM walker boot which has been helpful as well. 02/05/19 on evaluation today patient's wound bed actually appears to be doing significantly better in regard to her overall appearance of the size of the wound. With that being said she is still having an issue with offloading efficiently enough to get this to close. Apparently there is some signs of infection at this point as well unfortunately. Previously she's done well of Augmentin I really do not see anything that needs to be culture currently but there theme and cellulitis of the foot that I'm seeing I'm gonna go ahead and place her on an  antibiotic today to try to help clear this up. 02/12/2019 on evaluation today patient actually appears to be doing poorly in regard to her overall wound status. She tells me she has been using her offloading shoe but actually comes in today wearing her tennis shoe with the AFO brace. Again as I previously discussed with her this is really not sufficient to allow the area to heal appropriately. Nonetheless she continues to be somewhat noncompliant and I do wonder based on what she has told my nurse in the past as to CORLEY, LUKE A ([id number]) [id number] Page 6 of 22 whether or not a good portion of this noncompliance may be recreational drug and alcohol related. She has had a history of heroin overdose and this was fairly recently in the past couple of months that have been seeing her. Nonetheless overall I feel like her wound looks significantly worse today compared to what it was previous. She still has significant erythema despite the Augmentin I  am not sure that this is an appropriate medication for her infection I am also concerned that the infection may have gone down into her bone. 02/19/19 on evaluation today patient actually appears to be doing about the same in regard to her toe ulcer. Unfortunately she continues to show signs of bone exposure and infection at this point. There does not appear to be any evidence of worsening of the infection but I'm also not really sure that it's getting significantly better. She is on the Augmentin which should be sufficient for the Staphylococcus aureus infection that she has at this point. With that being said she may need IV antibiotics to more appropriately treat this. We did have a discussion today about hyperbaric option therapy. 02/28/19 on evaluation today patient actually appears to be doing much worse in regard to the wound on her left great toe as compared to even my previous evaluation last week. Unfortunately this seems  to be training in a pretty poor direction. Her toe was actually now starting to angle laterally and I can actually see the entire joint area of the proximal portion of the digit where is the distal portion of the digit again is no longer even in contact with the joint line. Unfortunately there's a lot more necrotic tissue around the edge and the toe appears to be showing signs of becoming gangrenous in my pinion. I'm very concerned about were things stand at this point. She did see infectious disease and they are planning to send in a prescription for Sivextro for her and apparently this has been approved. With that being said I don't think she should avoid taking this but at the same time I'm not sure that it's gonna be sufficient to save her toe at this point. 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 03/05/19 on evaluation today patient actually appears to be doing quite well in regard to her toe all things considering. She still has bone exposed but there appears to be much less your thing on overall the appearance of the wound and the toe itself is dramatically improved. She still does have some issues currently obviously with infection she did see vascular as well and there concerned that her blood flow to the toad. For that reason they are setting up for an angiogram next week. 03/14/19 on evaluation today patient appears to be doing very poor in regard to her toe and specifically in regard to the ulceration and the fact that she's starting to notice the toe was leaning even more towards the lateral aspect and the complete joint is visible on the proximal aspect of the joint. Nonetheless she's also noted a significant odor and the tip of the toe is turning more dark and necrotic appearing.  Overall I think she is getting worse not better as far as this is concerned. For that reason I am recommending at this point that she likely needs to be seen for likely amputation. READMISSION 03/19/2021 This is a patient that we cared for in this clinic for a prolonged period of time in 2019 and 2020 with a left foot and left first toe wound. 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 Luana Shu. In December 2021 she had an ulcer on her right great toe as well as the fourth and fifth toes. 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 She also had an angiogram at that time and underwent angioplasty of the right anterior tibial artery. In any case she claims that the wound on the right foot is closed I did not look at this today which was probably an oversight although I think that should be done next week. After her surgery she developed a dehiscence but I do not see any follow-up of this. According to Dr. Deborra Medina last review that she was out of the area being cared for by another physician but recently came back to his attention. 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 A culture of this area showed E. coli apparently before she came back to see Dr. Luana Shu she was supposed to be receiving antibiotics but she did not really take them. Nor is she offloading this area at all. Finally her last hemoglobin A1c listed in epic was in March 2022 at 14.1 she says things are a lot better since then although I am not sure. 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 She had this wound at the time. During this admission she had E. coli sepsis a TEE was negative. Past medical history is extensive and includes type 2 diabetes with peripheral neuropathy cardiomyopathy with  an ejection fraction of 33%, hypertension, hyperlipidemia chronic renal failure stage III history of substance abuse with cocaine although she claims to be clean now verified by her mother. She is still a heavy cigarette smoker. She has a history of bipolar disorder seizure disorder ABI in our clinic was 1.05 6/1; left midfoot in the setting of a TMA done previously. Round circular wound with a "knuckle" of protruding tissue. The problem is that the knuckle was not attached to any of the surrounding granulation and this probed proximally widely I removed a large portion of this tissue. This wound goes with considerable undermining laterally. I do not feel any bone there was no purulence but this is a deep wound. 6/8; in spite of the debridement I did last week. She arrives with a wound looking exactly the same. A protruding "knuckle" of tissue nonadherent to most of the surrounding tissue. There is considerable depth around this from 6-12 o'clock at 2.7 cm and undermining of 1 cm. This does not look overtly infected and the x- ray I did last week was negative for any osseous abnormalities. We have been using silver collagen 6/15; deep tissue culture I did last week showed moderate staph aureus and moderate Pseudomonas. This will definitely require prolonged antibiotic therapy. The pathology on the protuberant area was negative for malignancy fungus etc. 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 We have been using silver collagen. I am going to be prescribing Levaquin for 2 weeks. Her CT scan of the foot is down for 7/5 6/22; CT scan of the foot on 7 5. She says she has hardware in the left leg from her previous fracture. She is on the Levaquin for the deep tissue culture I did that showed methicillin sensitive staph aureus and Pseudomonas. I gave her a 2-week supply and she will have another week. She arrives in clinic today with the same  protuberant tissue however this is  nonadherent to the tissue surrounding it. I am really at a loss to explain this unless there is underlying deep tissue infection 6/29; patient presents for 1 week follow-up. She has been using collagen to the wound bed. She reports taking her antibiotics as prescribed.She has no complaints or issues today. She denies signs of infection. 7/6; patient presents for one week followup. She has been using collagen to the wound bed. She states she is taking Levaquin however at times she is not able to keep it down. She denies signs of infection. 7/13; patient presents for 1 week follow-up. She has been using silver alginate to the wound bed. She still has nausea when taking her antibiotics. She denies signs of infection. 7/20; patient presents for 1 week follow-up. She has been using silver alginate with gentamicin cream to the wound bed. She denies any issues and has no complaints today. She denies signs of infection. 7/27; patient presents for 1 week follow-up. She continues to use silver alginate with gentamicin cream to the wound bed. She reports starting her antibiotics. She has no issues or complaints. Overall she reports stability to the wound. 8/3; patient presents for 1 week follow-up. She has been using silver alginate with gentamicin cream to the wound bed. She reports completing all antibiotics. She has no issues or complaints today. She denies signs of infection. 8/17; patient presents for 2-week follow-up. He is to use silver alginate to the wound bed. She has no issues or complaints today. She denies signs of infection. She reports her pain has improved in her foot since last clinic visit 8/24; patient presents for 1 week follow-up. She continues to use silver alginate to the wound bed. She has no issues or complaints. She denies signs of infection. Pain is stable. 9/7; patient presents for follow-up. She missed her last week appointment due to feeling  ill. She continues to use silver alginate. She has a new wound to the right lower extremity that is covered in eschar. She states It occurred over the past week and has no idea how it started. She currently denies signs of infection. 9/14; patient presents for follow-up. T the left foot wound she has been using gentamicin cream and silver alginate. T the right lower extremity wound she has o o been keeping this covered and has not obtain Santyl. 9/21; patient presents for follow-up. She reports using gentamicin cream and silver alginate to the left foot and Santyl to the right lower extremity wound. She has no issues or complaints today. She denies signs of infection. 9/28; patient presents for follow-up. She reports a new wound to her right heel. She states this occurred a few days ago and is progressively gotten worse. She has been trying to clean the area with a Q-tip and Santyl. She reports stability in the other 2 wounds. She has been using gentamicin cream and silver alginate to the left foot and Santyl to the right lower extremity wound. JASEY, SHELTON A ([id number]) [phone].pdf Page 7 of 22 10/12; patient presents for follow-up. She reports improvement to the wound beds. She is seeing vein and vascular to discuss the potential of a left BKA. She states they are going to do an arteriogram. She continues to use silver alginate with dressing changes to her wounds. 11/2; patient presents for follow-up. She states she has not been doing dressing changes to the wound beds. She states she is not able to offload the areas. She reports chronic pain to her left foot wound.  11/9; patient presents for follow-up. She came in with only socks on. She states she forgot to put on shoes. It is unclear if she is doing any dressing changes. She currently denies systemic signs of infection. 11/16; patient presents for follow-up. She came again only with socks on. She states she does  not wear shoes ever. It is unclear if she does dressing changes. She currently denies systemic signs of infection. 11/23; patient presents for follow-up. She wore her shoes today. It still unclear exactly what dressing she is using for each wound but she did states she obtained Dakin's solution and has been using this to the left foot wound. She currently denies signs of infection. 11/30; patient presents for follow-up. She has no issues or complaints today. She currently denies signs of infection. 12/7; patient presents for follow-up. She has no issues or complaints today. She has been using Hydrofera Blue to the right heel wound and Dakin solution to the left foot wound. Her right anterior leg wound is healed. She currently denies signs of infection. 12/14; patient presents for follow-up. She has been using Hydrofera Blue to the right heel and Dakin's to the left foot wounds. She has no issues or complaints today. She denies signs of infection. 12/21; patient presents for follow-up. She reports using Hydrofera Blue to the right heel and Dakin's to the left foot wound. She denies signs of infection. 12/28; patient presents for follow-up. She continues to use Dakin's to the left foot wound and Hydrofera Blue to the right heel wound. She denies signs of infection. 1/4; patient presents for follow-up. She has no issues or complaints today. She denies signs of infection. 1/11; patient presents for follow-up. It is unclear if she has been dressing these wounds over the past week. She currently denies signs of infection. 1/18; patient presents for follow-up. She states she has been using Dakin's wet-to-dry dressings to the left foot. She has been using Hydrofera Blue to the right foot foot wound. She states that the anterior right leg wound has reopened and draining serous fluid. She denies signs of infection. 1/25; patient presents for follow-up. She has no issues or complaints today. 2/1; patient  presents for follow-up. She has no issues or complaints today. She denies signs of infection. 2/8; patient presents for follow-up. She has lost her surgical shoes. She did not have a dressing to the right heel wound. She currently denies signs of infection. 2/15; patient presents for follow-up. She reports more pain to the right heel today. She denies purulent drainage Or fever/chills 2/22; patient presents for follow-up. She reports taking clindamycin over the past week. She states that she continues to have pain to her right heel. She reports purulent drainage. Readmission 03/16/2022 Ms. Aiysha Freiwald is a 47 year old female with a past medical history of type 2 diabetes, osteomyelitis to her feet, chronic systolic heart failure and bipolar disorder that presents to the clinic for bilateral feet wounds and right lower extremity wound. She was last seen in our clinic on 12/15/2021. At that time she had purulent drainage coming out of her right plantar foot and I recommended she go to the ED. She states she went to [facility] and has been there for the past 3 months. I cannot see the records. She states she had OR debridement and was on several weeks of IV antibiotics while inpatient. Since discharge she has not been taking care of the wound beds. She had nothing on her feet other than socks  today. She currently denies signs of infection. 5/31; patient presents for follow-up. She has been using Dakin's wet-to-dry dressings to the wound beds on her feet bilaterally and antibiotic ointment to the right anterior leg wound. She had a wound culture done at last clinic visit that showed moderate Pseudomonas aeruginosa sensitive to ciprofloxacin. She currently denies systemic signs of infection. 6/14; patient presents for follow-up. She received Keystone 5 days ago and has been using this on the wound beds. She states that last week she had to go to the hospital because she had increased warmth  and erythema to the right foot. She was started on 2 oral antibiotics. She states she has been taking these. She currently denies systemic signs of infection. She has no issues or complaints today. 6/21; patient presents for follow-up. She states she has been using Keystone antibiotics to the wound beds. She has no issues or complaints today. She denies signs of infection. 6/28; patient presents for follow-up. She has been using Keystone antibiotics to the wound beds. She has no issues or complaints today. 7/12; patient presents for follow-up. Has been using Keystone antibiotics to the wound beds with calcium alginate. She has no issues or complaints today. She never followed up with her orthopedic surgeon who did the OR debridement to the right foot. We discussed the total contact cast for the left foot and patient would like to do this next week. 7/19; patient presents for follow-up. She has been using Keystone antibiotics with calcium alginate to the wound beds. She has no issues or complaints today. Patient is in agreement to do the total contact cast of the left foot today. She knows to return later this week for the obligatory cast change. 05-13-2022 upon evaluation today patient's wound which she has the cast of the left leg actually appears to be doing significantly better. Fortunately I do not see any signs of active infection locally or systemically which is great news and overall I am extremely pleased with where we stand currently. 7/26; patient presents for follow-up. She has a cast in place for the past week. She states it irritated her shin. Other than that she tolerated the cast well. She states she would like a break for 1 week from the cast. We have been using Keystone antibiotic and Aquacel to both wound beds. She denies signs of infection. 8/2; patient presents for follow-up. She has been using Keystone and Aquacel to the wound beds. She denies any issues and has no complaints. She  is agreeable to have the cast placed today for the left leg. 06-03-2022 upon evaluation today patient appears to be doing well with regard to her wound she saw some signs of improvement which is great news. Fortunately I do not see any evidence of active infection locally or systemically at this time which is great news. No fevers, chills, nausea, vomiting, or diarrhea. 8/16; patient presents for follow-up. She has no issues or complaints today. We have been using Keystone and Aquacel to the wound beds. The left lower extremity is in a total contact cast. She is tolerated this well. 8/23; patient presents for follow-up. She has had the total contact cast on the left leg for the past week. Unfortunately this has rubbed and broken down the Hata, Jakylah A ([id number]) [phone].pdf Page 8 of 22 skin to the medial foot. She currently denies signs of infection. She has been using Keystone antibiotic to the right plantar foot wound. 8/30; patient presents for follow-up. We have  held off on the total contact cast for the left leg for the past week. Her wound on the left foot has improved and the previous surrounding breakdown of skin has epithelialized. She has been using Keystone antibiotic to both wound beds. She has no issues or complaints today. She denies signs of infection. 9/6; patient presents for follow-up. She has ordered her's Keystone antibiotic and this is arriving this week. She has been doing Dakin's wet-to-dry dressings to the wound beds. She denies signs of infection. She is agreeable to the total contact cast today. 9/13; patient presents for follow-up. She states that the cast caused her left leg shin to be sore. She would like to take a break from the cast this week. She has been using Keystone antibiotic to the right plantar foot wound. She denies signs of infection. 9/20; patient presents for follow-up. She has been using Keystone antibiotics to the wound  beds with calcium alginate to the right foot wound and Hydrofera Blue to the left foot wound. She is agreeable to having the cast placed today. She has been approved for Apligraf and we will order this for next clinic visit. 9/27; patient presents for follow-up. We have been using Keystone antibiotic with Hydrofera Blue to the left foot wound under a total contact cast. T the right o foot wound she has been using Keystone antibiotic and calcium alginate. She declines a total contact cast today. Apligraf is available for placement and she would like to proceed with this. 07-28-2022 upon evaluation today patient appears to be doing well currently in regard to her wound. She is actually showing signs of significant improvement which is great news. Fortunately I do not see any evidence of active infection locally nor systemically at this time. She has been seeing Dr. Heber Sobieski and to be honest has been doing very well with the cast. Subsequently she comes in today with a cast on and we did reapply that today as well. She did not really want to she try to talk me out of that but I explained that if she wanted to heal this is really the right way to go. Patient voiced understanding. In regard to her right foot this is actually a lot better compared to the last time I saw her which is also great news. 10/11; patient presents for follow-up. Apligraf and the total contact cast was placed to the left leg at last clinic visit. She states that her right foot wound had burning pain to it with the placement of Apligraf to this area. She has been doing [place] over this area. She denies signs of infection including increased warmth, erythema or purulent drainage. 11/1; 3-week follow-up. The patient fortunately did not have a total contact cast or an Apligraf and on the left foot. She has been using Keystone ABD pads and kerlix and her own running shoes She arrives in clinic today with thick callus and a very poor  surface on the left foot on the right nonviable skin subcutaneous tissue and a deep probing hole. 11/15; patient missed her last clinic appointment. She states she has not been dressing the wound beds for the past 2 weeks. She states that at she had a new roommate but is now going back to live with her mother. Apparently its been a distracting 2 weeks. Patient currently denies signs of infection. 11/22; patient presents for follow-up. She states she has been using Keystone antibiotic and Dakin's wet-to-dry dressings to the wound beds. She is agreeable  for cast placement today. We had ordered Apligraf however this has not been received by our facility. 11/29; Patient had a total contact cast placed at last clinic visit and she tolerated this well. We were using silver alginate under the cast. Patient's been using Keystone antibiotic with Aquacel to the right plantar foot wound. She has no issues or complaints today. Apligraf is available for placement today. Patient would like to proceed with this. 12/6; patient presents for follow-up. She had Apligraf placed in standard fashion last clinic visit under the total contact cast to the left lower extremity. She has been using Keystone antibiotic and Aquacel to the right plantar foot wound. She has no issues or complaints today. 12/13; patient presents for follow-up. She has finished 5 Apligraf placements. Was told she would not qualify for more. We have been doing a total contact cast to the left lower extremity. She has been using Keystone antibiotic and Aquacel to the right plantar foot wound. She has no issues or complaints today. 12/20; patient presents for follow-up. We have been using Hydrofera Blue with Keystone antibiotic under a total contact cast of the left lower extremity. She reports using Keystone antibiotic and silver alginate to the right heel wound. She has no issues or complaints today. 12/27; patient presents with a healthy wound on the  left midfoot. We have Apligraf to apply that to that more also using a total contact cast. On the right we are using Keystone and silver alginate. She is offloading the right heel with a surgical shoealthough by her admission she is on her feet quite a bit 1/3; patient presents for follow-up. Apligraf was placed to the wound beds last clinic visit. She was placed in a total contact cast to the left lower extremity. She declines a total contact cast today. She states that her mother is in the hospital and she cannot adequately get around with the cast on. 1/10; patient presents for follow-up. She declined the total contact cast at last clinic visit. Both wounds have declined in appearance. She states that she has been on her feet and not offloading the wound beds. She currently denies signs of infection. 1/17; patient presents for follow-up. She had the total contact cast along with Apligraf placed last week to the left lower extremity. She tolerated this well. She has been using Aquacel Ag and Keystone antibiotic to the right heel wound. She currently denies signs of infection. She has no issues or complaints today. 1/24; patient presents for follow-up. We have been using Apligraf to the left foot wound along with a total contact cast. She has done well with this. T the right o heel wound she has been using Aquacel Ag and Keystone antibiotic ointment. She has no issues or complaints today. She denies signs of infection. 1/31; patient presents for follow-up. We have been using Apligraf to the left foot wound along with the total contact cast. She continues to do well with this. To the right heel we have been using Aquacel Ag and Keystone antibiotic ointment. She has no issues or complaints today. 12-01-2022 upon evaluation patient is seen today on my schedule due to the fact that she unfortunately was in the hospital yesterday. Her cast needed to come off the only reason she is out of the hospital is due  to the fact that they would not take it off at the hospital which is somewhat bewildered reading to me to be perfectly honest. I am not certain why this was  but either way she was released and then was placed on my schedule today in order to get this off and reapply the total contact casting as appropriate. I do not have an Apligraf for her today it was applied last week and today's actually expired yesterday as there was some scheduling conflicts with her being in the hospital. Nonetheless we do not have that for reapplication today but the good news that she is not draining too much and the Apligraf can go for up to 2 weeks so I am going to go ahead and reapply the total contact casting but we are going to leave the Apligraf in place. 2/14; patient presents for follow-up. T the left leg she has had the total contact cast and Apligraf for the past week. She has had no issues with this. T the o o right heel she has been using antibiotic ointment and Aquacel Ag. 2/21; patient presents for follow-up. We have been using Apligraf and a total contact cast to the left lower extremity. She is tolerated this well. Unfortunately she is not approved for any more Apligraf per insurance. She has been using antibiotic ointment and Aquacel Ag to the right foot. She has no issues or complaints today. 2/28; patient presents for follow-up. We have been using Hydrofera Blue and antibiotic ointment under the total contact cast to the left lower extremity. He has been using Aquacel Ag with antibiotic ointment to the right plantar foot. She has no issues or complaints today. Electronic Signature(s) Signed: 12/21/2022 1:53:20 PM By: Sherilyn Dacosta A ([id number]) [id number] Page 9 of 22 Entered By: Kalman Shan on 12/21/2022 12:53:46 -------------------------------------------------------------------------------- Physical Exam Details Patient Name: Date of Service: AZERA, MCCLARIN 12/21/2022 11:00 A M Medical Record Number: [id number] Patient Account Number: [id number] Date of Birth/Sex: Treating RN: 16-Dec-1975 (47 y.o. [name] Primary Care Provider: Raelene Bott Other Clinician: Massie Kluver Referring Provider: Treating Provider/Extender: Linna Hoff Weeks in Treatment: 80 Constitutional . Cardiovascular . Psychiatric . Notes Right foot: T the plantar heel there is an incision site with increased depth, No probing to bone. T the opening there is Nonviable tissue and granulation tissue. o o Left foot: T the medial aspect there is an open wound with granulation tissue, Callus and nonviable tissue. No overt signs of infection to any of the wound o beds. Electronic Signature(s) Signed: 12/21/2022 1:53:20 PM By: Kalman Shan DO Entered By: Kalman Shan on 12/21/2022 12:54:19 -------------------------------------------------------------------------------- Physician Orders Details Patient Name: Date of Service: [name] 12/21/2022 11:00 A M Medical Record Number: [id number] Patient Account Number: [id number] Date of Birth/Sex: Treating RN: 07/19/1976 (47 y.o. [name] Primary Care Provider: Raelene Bott Other Clinician: Massie Kluver Referring Provider: Treating Provider/Extender: Eddie North in Treatment: 57 Verbal / Phone Orders: No Diagnosis Coding Follow-up Appointments Return Appointment in 1 week. Nurse Visit as needed Bathing/ Shower/ Hygiene May shower with wound dressing protected with water repellent cover or cast protector. No tub bath. [place], MURCHISON A ([id number]) [phone].pdf Page 10 of 22 Anesthetic (Use 'Patient Medications' Section for Anesthetic Order Entry) Lidocaine applied to wound bed Edema Control - Lymphedema / Segmental Compressive Device / Other Elevate, Exercise Daily and A void Standing for Long Periods of  Time. Elevate legs to the level of the heart and pump ankles as often as possible Elevate leg(s) parallel to the floor when sitting. Off-Loading Total Contact Cast to Left Lower Extremity Open toe surgical shoe -  Heel off loader - Right foot Other: - keep pressure off of feet. Additional Orders / Instructions Follow Nutritious Diet and Increase Protein Intake Other: - Stay off of foot, wheelchair only Medications-Please add to medication list. Wound #11 Right,Plantar Foot Keystone Compound - use at home ntibiotic - apply Gentamycin in office only Topical A Wound Treatment Wound #11 - Foot Wound Laterality: Plantar, Right Cleanser: Wound Cleanser 1 x Per Day/30 Days Discharge Instructions: Wash your hands with soap and water. Remove old dressing, discard into plastic bag and place into trash. Cleanse the wound with Wound Cleanser prior to applying a clean dressing using gauze sponges, not tissues or cotton balls. Do not scrub or use excessive force. Pat dry using gauze sponges, not tissue or cotton balls. Topical: Gentamicin 1 x Per Day/30 Days Discharge Instructions: Apply as directed by provider. in clinic Prim Dressing: Aquacel Extra Hydrofiber Dressing, 2x2 (in/in) ary 1 x Per Day/30 Days Secondary Dressing: ABD Pad 5x9 (in/in) (Generic) 1 x Per Day/30 Days Discharge Instructions: Cover with ABD pad Secured With: Medipore T - 12M Medipore H Soft Cloth Surgical T ape ape, 2x2 (in/yd) 1 x Per Day/30 Days Secured With: [employer] Sterile or Non-Sterile 6-ply 4.5x4 (yd/yd) 1 x Per Day/30 Days Discharge Instructions: Apply Kerlix as directed Wound #12 - Foot Wound Laterality: Plantar, Left, Medial Cleanser: Wound Cleanser 1 x Per Day/30 Days Discharge Instructions: Wash your hands with soap and water. Remove old dressing, discard into plastic bag and place into trash. Cleanse the wound with Wound Cleanser prior to applying a clean dressing using gauze sponges, not tissues or cotton  balls. Do not scrub or use excessive force. Pat dry using gauze sponges, not tissue or cotton balls. Topical: Gentamicin 1 x Per Day/30 Days Discharge Instructions: Apply as directed by provider. in clinic Prim Dressing: Hydrofera Blue Ready Transfer Foam, 2.5x2.5 (in/in) 1 x Per Day/30 Days ary Discharge Instructions: Apply Hydrofera Blue Ready to wound bed as directed Prim Dressing: Silvercel 4 1/4x 4 1/4 (in/in) 1 x Per Day/30 Days ary Discharge Instructions: Apply to periwound Secondary Dressing: ABD Pad 5x9 (in/in) (Generic) 1 x Per Day/30 Days Discharge Instructions: Cover with ABD pad Secured With: Medipore T - 12M Medipore H Soft Cloth Surgical T ape ape, 2x2 (in/yd) 1 x Per Day/30 Days Secured With: [employer] Sterile or Non-Sterile 6-ply 4.5x4 (yd/yd) 1 x Per Day/30 Days Discharge Instructions: Apply Kerlix as directed Patient Medications llergies: erythromycin base, Neosporin (neo-bac-polym) A Notifications Medication Indication Start End 12/21/2022 gentamicin DOSE 1 - topical 0.1 % cream - apply once daily Routt, Pheonix A ([id number]) [id number] Page 11 of 22 Electronic Signature(s) Signed: 12/21/2022 1:53:20 PM By: Kalman Shan DO Previous Signature: 12/21/2022 12:12:53 PM Version By: Kalman Shan DO Entered By: Kalman Shan on 12/21/2022 12:58:28 -------------------------------------------------------------------------------- Problem List Details Patient Name: Date of Service: [name] 12/21/2022 11:00 A M Medical Record Number: [id number] Patient Account Number: [id number] Date of Birth/Sex: Treating RN: 07-07-1976 (47 y.o. [name] Primary Care Provider: Raelene Bott Other Clinician: Massie Kluver Referring Provider: Treating Provider/Extender: Eddie North in Treatment: 68 Active Problems ICD-10 Encounter Code Description Active Date MDM Diagnosis L97.528 Non-pressure chronic  ulcer of other part of left foot with other specified 03/16/2022 No Yes severity L97.512 Non-pressure chronic ulcer of other part of right foot with fat layer exposed 03/16/2022 No Yes E11.621 Type 2 diabetes mellitus with foot ulcer 03/16/2022 No Yes E11.42 Type 2 diabetes mellitus with diabetic polyneuropathy  03/16/2022 No Yes L97.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 03/16/2022 No Yes of skin Inactive Problems Resolved Problems Electronic Signature(s) Signed: 12/21/2022 1:53:20 PM By: Kalman Shan DO Entered By: Kalman Shan on 12/21/2022 12:52:32 Nile Dear A ([id number]) [id number] Page 12 of 22 -------------------------------------------------------------------------------- Progress Note Details Patient Name: Date of Service: ELLIAH, DERING 12/21/2022 11:00 A M Medical Record Number: [id number] Patient Account Number: [id number] Date of Birth/Sex: Treating RN: 04-12-1976 (47 y.o. [name] Primary Care Provider: Raelene Bott Other Clinician: Massie Kluver Referring Provider: Treating Provider/Extender: Eddie North in Treatment: 40 Subjective Chief Complaint Information obtained from Patient 03/19/2021; patient referred by Dr. Luana Shu who has been looking after her left foot for quite a period of time for review of a nonhealing area in the left midfoot 03/12/2022; bilateral feet wounds and right lower extremity wound. History of Present Illness (HPI) 01/18/18-She is here for initial evaluation of the left great toe ulcer. She is a poor historian in regards to timeframe in detail. She states approximately 4 weeks ago she lacerated her toe on something in the house. She followed up with her primary care who placed her on Bactrim and ultimately a second dose of Bactrim prior to coming to wound clinic. She states she has been treating the toe with peroxide, Betadine and a  Band-Aid. She did not check her blood sugar this morning but checked it yesterday morning it was 327; she is unaware of a recent A1c and there are no current records. She saw Dr. she would've orthopedics last week for an old injury to the left ankle, she states he did not see her toe, nor did she bring it to his attention. She smokes approximately 1 pack cigarettes a day. Her social situation is concerning, she arrives this morning with her mother who appears extremely intoxicated/under the influence; her mother was asked to leave the room and be monitored by the patient's grandmother. The patient's aunt then accompanied the patient and the room throughout the rest of the appointment. We had a lengthy discussion regarding the deleterious effects of uncontrolled hyperglycemia and smoking as it relates to wound healing and overall health. She was strongly encouraged to decrease her smoking and get her diabetes under better control. She states she is currently on a diet and has cut down her [facility] consumption. The left toe is erythematous, macerated and slightly edematous with malodor present. The edema in her left foot is below her baseline, there is no erythema streaking. We will treat her with Santyl, doxycycline; we have ordered and xray, culture and provided a Peg assist surgical shoe and cultured the wound. 01/25/18-She is here in follow-up evaluation for a left great toe ulcer and presents with an abscess to her suprapubic area. She states her blood sugars remain elevated, feeling "sick" and if levels are below 250, but she is trying. She has made no attempt to decrease her smoking stating that we "can't take away her food in her cigarettes". She has been compliant with offloading using the PEG assist you. She is using Santyl daily. the culture obtained last week grew staph aureus and Enterococcus faecalis; continues on the doxycycline and Augmentin was added on Monday. The suprapubic area has  erythema, no femoral variation, purple discoloration, minimal induration, was accessed with a cotton tip applicator with sanguinopurulent drainage, this was cultured, I suspect the current antibiotic treatment will cover and we will not add anything to  her current treatment plan. She was advised to go to urgent care or ER with any change in redness, induration or fever. 02/01/18-She is here in follow-up evaluation for left great toe ulcers and a new abdominal abscess from last week. She was able to use packing until earlier this week, where she "forgot it was there". She states she was feeling ill with GI symptoms last week and was not taking her antibiotic. She states her glucose levels have been predominantly less than 200, with occasional levels between 200-250. She thinks this was contributing to her GI symptoms as they have resolved without intervention. There continues to be significant laceration to left toe, otherwise it clinically looks stable/improved. There is now less superficial opening to the lateral aspect of the great toe that was residual blister. We will transition to [facility] to all wounds, she will continue her Augmentin. If there is no change or deterioration next week for reculture. 02/08/18-She is here in follow-up evaluation for left great toe ulcer and abdominal ulcer. There is an improvement in both wounds. She has been wrapping her left toe with coban, not by our direction, which has created an area of discoloration to the medial aspect; she has been advised to NOT use coban secondary to her neuropathy. She states her glucose levels have been high over this last week ranging from 200-350, she continues to smoke. She admits to being less compliant with her offloading shoe. We will continue with same treatment plan and she will follow-up next week. 02/15/18-She is here in follow-up evaluation for left great toe ulcer and abdominal ulcer. The abdominal ulcer is  epithelialized. The left great toe ulcer is improved and all injury from last week using the Coban wrap is resolved, the lateral ulcer is healed. She admits to noncompliance with wearing offloading shoe and admits to glucose levels being greater than 300 most of the week. She continues to smoke and expresses no desire to quit. There is one area medially that probes deeper than it has historically, erythema to the toe and dorsal foot has consistently waxed and waned. There is no overt signs of cellulitis or infection but we will culture the wound for any occult infection given the new area of depth and erythema. We will hold off on sensitivities for initiation of antibiotic therapy. 02/22/18-She is here in follow up evaluation for left great toe ulcer. There is overall significant improvement in both wound appearance, erythema and edema with changes made last week. She was not initiated on antibiotic therapy. Culture obtained last week showed oxacillin sensitive staph aureus, sensitive to clindamycin. Clindamycin has been called into the pharmacy but she has been instructed to hold off on initiation secondary to overall clinical improvement and her history of antibiotic intolerance. She has been instructed to contact the clinic with any noted changes/deterioration and the wound, erythema, edema and/or pain. She will follow-up next week. She continues to smoke and her glucose levels remain elevated >250; she admits to compliance with offloading shoe 03/01/18 on evaluation today patient appears to be doing fairly well in regard to her left first toe ulcer. She has been tolerating the dressing changes with the [facility] Dressing without complication and overall this has definitely showed signs of improvement according to records as well is what the patient tells me today. I'm very pleased in that regard. She is having no pain today 03/08/18 She is here for follow up evaluation of a left great toe ulcer.  She remains non-compliant  with glucose control and smoking cessation; glucose levels consistently >200. She states that she got new shoe inserts/peg assist. She admits to compliance with offloading. Since my last evaluation there is significant improvement. We will switch to prisma at this time and she will follow up next week. She is noted to be tachycardic at this appointment, heart rate 120s; she has a history of heart rate 70-130 according to our records. She admits to extreme agitation r/t personal issues; she was advised to monitor her heartrate and contact her physician if it does not return to a more normal range (<100). She takes cardizem twice daily. 03/15/18-She is here in follow-up evaluation for left great toe ulcer. She remains noncompliant with glucose control and smoking cessation. She admits to compliance with wearing offloading shoe. The ulcer is improved/stable and we will continue with the same treatment plan and she will follow-up next week 03/22/18-She is here for evaluation for left great toe ulcer. There continues to be significant improvement despite recurrent hyperglycemia (over 500 yesterday) and she continues to smoke. She has been compliant with offloading and we will continue with same treatment plan and she will follow-up next week. 03/29/18-She is here for evaluation for left great toe ulcer. Despite continuing to smoke and uncontrolled diabetes she continues to improve. She is compliant with offloading shoe. We will continue with the same treatment plan and she will follow-up next week 04/05/18- She is here in follow up evaluation for a left great toe ulcer; she presents with small pustule to left fifth toe (resembles ant bite). She admits to compliance with wearing offloading shoe; continues to smoke or have uncontrolled blood glucose control. There is more callus than usual with evidence of BEYONKA, HIESTER A ([id number]) [phone].pdf Page 13 of  22 bleeding; she denies known trauma. 04/12/18-She is here for evaluation of left great toe ulcer. Despite noncompliance with glycemic control and smoking she continues to make improvement. She continues to wear offloading shoe. The pustule, that was identified last week, to the left fifth toe is resolved. She will follow-up in 2 weeks 05/03/18-she is seen in follow-up evaluation for a left great toe ulcer. She is compliant with offloading, otherwise noncompliant with glycemic control and smoking. She has plateaued and there is minimal improvement noted. We will transition to [facility], replaced the insert to her surgical shoe and she will follow-up in one week 05/10/18- She is here in follow up evaluation for a left great toe ulcer. It appears stable despite measurement change. We will continue with same treatment plan and follow up next week. 05/24/18-She is seen in follow-up evaluation for a left great toe ulcer. She remains compliant with offloading, has made significant improvement in her diet, decreasing the amount of sugar/soda. She said her recent A1c was 10.9 which is lower than. She did see a diabetic nutritionist/educator yesterday. She continues to smoke. We will continue with the same treatment plan and she'll follow-up next week. 05/31/18- She is seen in follow-up evaluation for left great toe ulcer. She continues to remain compliant with offloading, continues to make improvement in her diet, increasing her water and decreasing the amount of sugar/soda. She does continue to smoke with no desire to quit. We will apply Prisma to the depth and Hydrofera Blue over. We have not received insurance authorization for oasis. She will follow up next week. 06/07/18-She is seen in follow-up evaluation for left great toe ulcer. It has stalled according to today's measurements although base appears stable. She says  she saw a diabetic educator yesterday; her average blood sugars are less than 300 which  is an improvement for her. She continues to smoke and states "that's my next step" She continues with water over soda. We will order for xray, culture and reinstate ace wrap compression prior to placing apligraf for next week. She is voicing no complaints or concerns. Her dressing will change to iodoflex over the next week in preparation for apligraf. 06/14/18-She is seen in follow-up evaluation for left great toe ulcer. Plain film x-ray performed last week was negative for osteomyelitis. Wound culture obtained last week grew strep B and OSSA; she is initiated on keflex and cefdinir today; there is erythema to the toe which could be from ace wrap compression, she has a history of wrapping too tight and has has been encouraged to maintain ace wraps that we place today. We will hold off on application of apligraf today, will apply next week after antibiotic therapy has been initiated. She admits today that she has resumed taking a shower with her foot/toe submerged in water, she has been reminded to keep foot/toe out of the bath water. She will be seen in follow up next week 06/21/18-she is seen in follow-up evaluation for left great toe ulcer. She is tolerating antibiotic therapy with no GI disturbance. The wound is stable. Apligraf was applied today. She has been decreasing her smoking, only had 4 cigarettes yesterday and 1 today. She continues being more compliant in diabetic diet. She will follow-up next week for evaluation of site, if stable will remove at 2 weeks. 06/28/18- She is here in follow up evalution. Apligraf was placed last week, she states the dressing fell off on Tuesday and she was dressing with hydrofera blue. She is healed and will be discharged from the clinic today. She has been instructed to continue with smoking cessation, continue monitoring glucose levels, offloading for an additional 4 weeks and continue with hydrofera blue for additional two weeks for any possible microscopic  opening. Readmission: 08/07/18 on evaluation today patient presents for reevaluation concerning the ulcer of her right great toe. She was previously discharged on 06/28/18 healed. Nonetheless she states that this began to show signs of drainage she subsequently went to her primary care provider. Subsequently an x-ray was performed on 08/01/18 which was negative. The patient was also placed on antibiotics at that time. Fortunately they should have been effective for the infection. Nonetheless she's been experiencing some improvement but still has a lot of drainage coming from the wound itself. 08/14/18 on evaluation today patient's wound actually does show signs of improvement in regard to the erythema at this point. She has completed the antibiotics. With that being said we did discuss the possibility of placing her in a total contact cast as of today although I think that I may want to give this just a little bit more time to ensure nothing recurrence as far as her infection is concerned. I do not want to put in the cast and risk infection at that time if things are not completely resolved. With that being said she is gonna require some debridement today. 08/21/18 on evaluation today patient actually appears to be doing okay in regard to her toe ulcer. She's been tolerating the dressing changes without complication. With that being said it does appear that she is ready and in fact I think it's appropriate for [place] to go ahead and initiate the total contact cast today. Nonetheless she will require some sharp debridement  to prepare the wound for application. Overall I feel like things have been progressing well but we do need to do something to get this to close more readily. 08/24/18 patient seen today for reevaluation after having had the total contact cast applied on Tuesday. She seems to have done very well the wound appears to be doing great and overall I'm pleased with the progress that she's made.  There were no abnormal areas of rubbing from the cast on her lower extremity. 08/30/18 on evaluation today patient actually appears to be completely healed in regard to her plantar toe ulcer. She tells me at this point she's been having a lot of issues with the cast. She almost fell a couple of times the state shall the step of her dog a couple times as well. This is been a very frustrating process for her other nonetheless she has completely healed the wound which is excellent news. Overall there does not appear to be the evidence of infection at this time which is great news. 09/11/18 evaluation today patient presents for follow-up concerning her great toe ulcer on the left which has unfortunately reopened since I last saw her which was only a couple of weeks ago. Unfortunately she was not able to get in to get the shoe and potentially the AFO that's gonna be necessary due to her left foot drop. She continues with offloading shoe but this is not enough to prevent her from reopening it appears. When we last had her in the total contact cast she did well from a healing standpoint but unfortunately the wound reopened as soon as she came out of the cast within just a couple of weeks. 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 This is also part of the reason why she falls on a regular basis. I just do not believe that is gonna be safe for [place] to reinitiate the total contact cast as last time we had this on she fell 3 times one week which is definitely not normal for her. 09/18/18 upon evaluation today the patient actually appears to be doing about the same in regard to her toe ulcer. She did not contact [facility] as I asked her to even though I had given her the prescription. In fact she actually states that she  has no idea where the prescription is. She did apparently call [facility] and they told her that all she needed to do was bring the prescription in order to be able to be seen and work on getting the AFO for her left foot. With all that being said she still does not have an appointment and I'm not sure were things stand that regard. I will give her a new prescription today in order to contact them to get this set up. 09/25/18 on evaluation today patient actually appears to be doing about the same in regard to her toes ulcer. She does have a small areas which seems to have a lot of callous buildup around the edge of the wound which is going to need sharp debridement today. She still is waiting to be scheduled for evaluation with [facility] for possibility of an AFO. She states there supposed to call her tomorrow to get this set up. Unfortunately it does appear that her foot  specifically the toe area is showing signs of erythema. There does not appear to be any systemic infection which is in these good news. 10/02/18 on evaluation today patient actually appears to be doing about the same in regard to her toe ulcer. This really has not done too well although it's not significantly larger it's also not significantly smaller. She has been tolerating the dressing changes without complication. She actually has her appointment with [facility] and [facility] tomorrow to hopefully be measured for obtaining and AFO splint. I think this would be helpful preventing this from reoccurring. We had contemplated starting the cast this week although to be honest I am reluctant to do that as she's been having nausea, vomiting, and seizure activity over the past three days. She has a history of seizures and have been told is nothing that can be done for these. With that being said I do believe that along with the seizures have the nausea vomiting which upon further questioning doesn't seem to be the normal for her and makes me  concerned for the possibility of infection or something else going on. I discussed this with the patient and her mother during the office visit today. I do not feel the wound is effective but maybe something else. The responses this was "this just happens to her at times and we don't know why". They did not seem to be interested in going to the hospital to have this checked out further. 10/09/18 on evaluation today patient presents for follow-up concerning her ongoing toe ulcer. She has been tolerating the dressing changes without complication. Fortunately there does not appear to be any evidence of infection which is great news however I do think that the patient would benefit from going ahead for with the total contact cast. She's actually in a wheelchair today she tells me that she will use her walker if we initiate the cast. I was very specific about the fact that if we were gonna do the cast I wanted to make sure that she was using the walker in order to prevent any falls. She tells me she does not have stairs that she has to traverse on a regular basis at her home. She has not had any seizures since last week again that something that happens to her often she tells me she did talk to [employer] and they said that it may take up to three weeks to get the brace approved for her. Hopefully that will not take that long but nonetheless in the meantime I do think the cast could be of benefit. NEELA, BERKLAND A ([id number]) [phone].pdf Page 14 of 22 10/12/18 on evaluation today patient appears to be doing rather well in regard to her toe ulcer. It's just been a few days and already this is significantly improved both as far as overall appearance and size. Fortunately there's no sign of infection. She is here for her first obligatory cast change. 10/19/18 Seen today for follow up and management of left great toe ulcer. Wound continues to show improvement. Noted small open area with  seroussang drainage with palpation. Denies any increased pain or recent fevers during visit. She will continue calcium alginate with offloading shoe. Denies any questions or concerns during visit. 10/26/18 on evaluation today patient appears to be doing about the same as when I last saw her in regard to her wound bed. Fortunately there does not appear to be any signs of infection. Unfortunately she continues to have a breakdown in regard to  the toe region any time that she is not in the cast. It takes almost no time at all for this to happen. Nonetheless she still has not heard anything from the brace being made by [employer] as to when exactly this will be available to her. Fortunately there is no signs of infection at this time. 10/30/18 on evaluation today patient presents for application of the total contact cast as we just received him this morning. Fortunately we are gonna be able to apply this to her today which is great news. She continues to have no significant pain which is good news. Overall I do feel like things have been improving while she was the cast is when she doesn't have a cast that things get worse. She still has not really heard anything from [facility] regarding her brace. 11/02/18 upon evaluation today patient's wound already appears to be doing significantly better which is good news. Fortunately there does not appear to be any signs of infection also good news. Overall I do think the total contact cast as before is helping to heal this area unfortunately it's just not gonna likely keep the area closed and healed without her getting her brace at least. Again the foot drop is a significant issue for her. 11/09/18 on evaluation today patient appears to be doing excellent in regard to her toe ulcer which in fact is completely healed. Fortunately we finally got the situation squared away with the paperwork which was needed to proceed with getting her brace approved by Medicaid. I have filled  that out unfortunately that information has been sent to the orthopedic office that I worked at 2 1/2 years ago and not tired Current wound care measures. Fortunately she seems to be doing very well at this time. 11/23/18 on evaluation today patient appears to be doing More Poorly Compared to Last Time I Saw Her. At [facility] She Had Completely Healed. Currently she is continuing to have issues with reopening. She states that she just found out that the brace was approved through Medicaid now she just has to go get measured in order to have this fitted for her and then made. Subsequently she does not have an appointment for this yet that is going to complicate things we obviously cannot put her back in the cast if we do not have everything measured because they're not gonna be able to measure her foot while she is in the cast. 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 Obviously this is unfortunate and does have me somewhat worried as well. 11/30/18 on evaluation today patient's toe ulcer actually appears to be doing fairly well. The good news is she will be getting her brace in the shoes next week on Wednesday. Hopefully we will be able to get this to heal without having to go back in the cast however she may need the cast in order to get the wound completely heal and then go from there. Fortunately there's no signs of infection at this time. 12/07/18 on evaluation today patient fortunately did receive her brace and she states she could tell this definitely makes her walk better. With that being said she's been having issues with her toe where she noticed yesterday there was a lot of tissue that was loosing off this appears to be much larger than what it was previous. She also states that her leg has  been read putting much across the top of her foot just about the ankle although this seems to be  receiving somewhat. The total area is still red and appears to be someone infected as best I can tell. She is previously taken Bactrim and that may be a good option for her today as well. We are gonna see what I wound culture shows as well and I think that this is definitely appropriate. With that being said outside of the culture I still need to initiate something in the interim and that's what I'm gonna go ahead and select Bactrim is a good option for her. 12/14/18 on evaluation today patient appears to be doing better in regard to her left great toe ulcer as compared to last week's evaluation. There's still some erythema although this is significantly improved which is excellent news. Overall I do believe that she is making good progress is still gonna take some time before she is where I would like her to be from the standpoint of being able to place her back into the total contact cast. Hopefully we will be where we need to be by next week. 12/21/18 on evaluation today patient actually appears to be doing poorly in regard to her toe ulcer. She's been tolerating the dressing changes without complication. Fortunately there's no signs of systemic infection although she does have a lot of drainage from the toe ulcer and this does seem to be causing some issues at this point. She does have erythema on the distal portion of her toe that appears to be likely cellulitis. 12/28/18 on evaluation today patient actually appears to be doing a little better in my pinion in regard to her toe ulcer. With that being said she still does have some evidence of infection at this time and for her culture she had both E. coli as well as enterococcus as organisms noted on evaluation. For that reason I think that though the Keflex likely has treated the E. coli rather well this has really done nothing for the enterococcus. We are going to have to initiate treatment for this specifically. 01/04/19 on evaluation today patient's  toe actually appears to be doing better from the standpoint of infection. She currently would like to see about putting the cash back on I think that this is appropriate as long as she takes care of it and keeps it from getting wet. She is gonna have some drainage we can definitely pass this up with Drawtex and alginate to try to prevent as much drainage as possible from causing the problems. With that being said I do want to at least try her with the cast between now and Tuesday. If there any issues we can't continue to use it then I will discontinue the use of the cast at that point. 01/08/19 on evaluation today patient actually appears to be doing very well as far as her foot ulcer specifically the great toe on the left is concerned. She did have an area of rubbing on the medial aspect of her left ankle which again is from the cast. Fortunately there's no signs of infection at this point in this appears to be a very slight skin breakdown. The patient tells me she felt it rubbing but didn't think it was that bad. Fortunately there is no signs of active infection at this time which is good news. No fevers, chills, nausea, or vomiting noted at this time. 01/15/19 on evaluation today patient actually appears to be doing well  in regard to her toe ulcer. Again as previous she seems to do well and she has the cast on which indicates to me that during the time she doesn't have a cast on she's putting way too much pressure on this region. Obviously I think that's gonna be an issue as with the current national emergency concerning the Covid-19 Virus it has been recommended that we discontinue the use of total contact casting by the chief medical officer of our company, Dr. Simona Huh. The reasoning is that if a patient becomes sick and cannot come into have the cast removed they could not just leave this on for an additional two weeks. Obviously the hospitals also do not want to receive patient's who are sick into  the emergency department to potentially contaminate the region and spread the Covid-19 Virus among other sick individuals within the hospital system. Therefore at this point we are suspending the use of total contact cast until the current emergency subsides. This was all discussed with the patient today as well. 01/22/19 on evaluation today patient's wound on her left great toe appears to be doing slightly worse than previously noted last week. She tells me that she has been on this quite a bit in fact she tells me she's been awake for 38 straight hours. This is due to the fact that she's having to care for grandparents because nobody else will. She has been taking care of them for five the last seven days since I've seen her they both have dementia his is from a stroke and her grandmother's was progressive. Nonetheless she states even her mom who knows her condition and situation has only help two of those days to take care of them she's been taking care of the rest. Fortunately there does not appear to be any signs of active infection in regard to her toe at this point although obviously it doesn't look as good as it did previous. I think this is directly related to her not taking off the pressure and friction by way of taking things easy. Though I completely understand what's going on. 01/29/19 on evaluation today patient's tools are actually appears to be showing some signs of improvement today compared to last week's evaluation as far as not necessarily the overall size of the wound but the fact that she has some new skin growth in between the two ends of the wound opening. Overall I feel like she has done well she states that she had a family member give her what sounds to be a CAM walker boot which has been helpful as well. 02/05/19 on evaluation today patient's wound bed actually appears to be doing significantly better in regard to her overall appearance of the size of the wound. With that being  said she is still having an issue with offloading efficiently enough to get this to close. Apparently there is some signs of infection at this point as well unfortunately. Previously she's done well of Augmentin I really do not see anything that needs to be culture currently but there theme and cellulitis of the foot that I'm seeing I'm gonna go ahead and place her on an antibiotic today to try to help clear this up. CAROLJEAN, PAZMINO A ([id number]) [phone].pdf Page 15 of 22 02/12/2019 on evaluation today patient actually appears to be doing poorly in regard to her overall wound status. She tells me she has been using her offloading shoe but actually comes in today wearing her tennis shoe with the AFO  brace. Again as I previously discussed with her this is really not sufficient to allow the area to heal appropriately. Nonetheless she continues to be somewhat noncompliant and I do wonder based on what she has told my nurse in the past as to whether or not a good portion of this noncompliance may be recreational drug and alcohol related. She has had a history of heroin overdose and this was fairly recently in the past couple of months that have been seeing her. Nonetheless overall I feel like her wound looks significantly worse today compared to what it was previous. She still has significant erythema despite the Augmentin I am not sure that this is an appropriate medication for her infection I am also concerned that the infection may have gone down into her bone. 02/19/19 on evaluation today patient actually appears to be doing about the same in regard to her toe ulcer. Unfortunately she continues to show signs of bone exposure and infection at this point. There does not appear to be any evidence of worsening of the infection but I'm also not really sure that it's getting significantly better. She is on the Augmentin which should be sufficient for the Staphylococcus aureus infection  that she has at this point. With that being said she may need IV antibiotics to more appropriately treat this. We did have a discussion today about hyperbaric option therapy. 02/28/19 on evaluation today patient actually appears to be doing much worse in regard to the wound on her left great toe as compared to even my previous evaluation last week. Unfortunately this seems to be training in a pretty poor direction. Her toe was actually now starting to angle laterally and I can actually see the entire joint area of the proximal portion of the digit where is the distal portion of the digit again is no longer even in contact with the joint line. Unfortunately there's a lot more necrotic tissue around the edge and the toe appears to be showing signs of becoming gangrenous in my pinion. I'm very concerned about were things stand at this point. She did see infectious disease and they are planning to send in a prescription for Sivextro for her and apparently this has been approved. With that being said I don't think she should avoid taking this but at the same time I'm not sure that it's gonna be sufficient to save her toe at this point. 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 03/05/19 on evaluation today patient actually appears to be doing quite well in regard to her toe all things considering. She still has bone exposed but there appears to be much less your thing on overall the appearance of the wound and the toe itself is dramatically improved. She still does have some issues currently obviously with infection she did see vascular as well and there concerned that her blood flow to the toad. For that reason they are setting up for an angiogram next week. 03/14/19 on evaluation today patient appears to be  doing very poor in regard to her toe and specifically in regard to the ulceration and the fact that she's starting to notice the toe was leaning even more towards the lateral aspect and the complete joint is visible on the proximal aspect of the  joint. Nonetheless she's also noted a significant odor and the tip of the toe is turning more dark and necrotic appearing. Overall I think she is getting worse not better as far as this is concerned. For that reason I am recommending at this point that she likely needs to be seen for likely amputation. READMISSION 03/19/2021 This is a patient that we cared for in this clinic for a prolonged period of time in 2019 and 2020 with a left foot and left first toe wound. 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 Luana Shu. In December 2021 she had an ulcer on her right great toe as well as the fourth and fifth toes. 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 She also had an angiogram at that time and underwent angioplasty of the right anterior tibial artery. In any case she claims that the wound on the right foot is closed I did not look at this today which was probably an oversight although I think that should be done next week. After her surgery she developed a dehiscence but I do not see any follow-up of this. According to Dr. Deborra Medina last review that she was out of the area being cared for by another physician but recently came back to his attention. 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 A culture of this area showed E. coli apparently before she came back to see Dr. Luana Shu she was supposed to be receiving antibiotics but she did not really take them. Nor is she offloading this area at all. Finally her last hemoglobin A1c listed in epic was in March 2022 at 14.1 she says things are a lot better since  then although I am not sure. 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 She had this wound at the time. During this admission she had E. coli sepsis a TEE was negative. 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 She is still a heavy cigarette smoker. She has a history of bipolar disorder seizure disorder ABI in our clinic was 1.05 6/1; left midfoot in the setting of a TMA done previously. Round circular wound with a "knuckle" of protruding tissue. The problem is that the knuckle was not attached to any of the surrounding granulation and this probed proximally widely I removed a large portion of this tissue. This wound goes with considerable undermining laterally. I do not feel any bone there was no purulence but this is a deep wound. 6/8; in spite of the debridement I did last week. She arrives with a wound looking exactly the same. A protruding "knuckle" of tissue nonadherent to most of the surrounding tissue. There is considerable depth around this from 6-12 o'clock at 2.7 cm and undermining of 1 cm. This does not look overtly infected and the x- ray I did last week was negative for any osseous abnormalities. We have been using silver collagen 6/15; deep tissue culture I did last week showed moderate staph aureus and moderate Pseudomonas. This will definitely require prolonged antibiotic therapy. The pathology on the protuberant area was negative for malignancy  fungus etc. 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 We have been using silver collagen. I am going to be prescribing Levaquin for 2 weeks.  Her CT scan of the foot is down for 7/5 6/22; CT scan of the foot on 7 5. She says she has hardware in the left leg from her previous fracture. She is on the Levaquin for the deep tissue culture I did that showed methicillin sensitive staph aureus and Pseudomonas. I gave her a 2-week supply and she will have another week. She arrives in clinic today with the same protuberant tissue however this is nonadherent to the tissue surrounding it. I am really at a loss to explain this unless there is underlying deep tissue infection 6/29; patient presents for 1 week follow-up. She has been using collagen to the wound bed. She reports taking her antibiotics as prescribed.She has no complaints or issues today. She denies signs of infection. 7/6; patient presents for one week followup. She has been using collagen to the wound bed. She states she is taking Levaquin however at times she is not able to keep it down. She denies signs of infection. 7/13; patient presents for 1 week follow-up. She has been using silver alginate to the wound bed. She still has nausea when taking her antibiotics. She denies signs of infection. 7/20; patient presents for 1 week follow-up. She has been using silver alginate with gentamicin cream to the wound bed. She denies any issues and has no complaints today. She denies signs of infection. 7/27; patient presents for 1 week follow-up. She continues to use silver alginate with gentamicin cream to the wound bed. She reports starting her antibiotics. She has no issues or complaints. Overall she reports stability to the wound. 8/3; patient presents for 1 week follow-up. She has been using silver alginate with gentamicin cream to the wound bed. She reports completing all antibiotics. She has no issues or complaints today. She denies signs of infection. 8/17; patient presents for 2-week follow-up. He is to use silver alginate to the wound bed. She has no issues or complaints today. She  denies signs of infection. She reports her pain has improved in her foot since last clinic visit 8/24; patient presents for 1 week follow-up. She continues to use silver alginate to the wound bed. She has no issues or complaints. She denies signs of infection. Pain is stable. 9/7; patient presents for follow-up. She missed her last week appointment due to feeling ill. She continues to use silver alginate. She has a new wound to the right lower extremity that is covered in eschar. She states It occurred over the past week and has no idea how it started. She currently denies signs of infection. 9/14; patient presents for follow-up. T the left foot wound she has been using gentamicin cream and silver alginate. T the right lower extremity wound she has o o been keeping this covered and has not obtain Santyl. 9/21; patient presents for follow-up. She reports using gentamicin cream and silver alginate to the left foot and Santyl to the right lower extremity wound. She has no issues or complaints today. She denies signs of infection. LEANNA, DAYE A ([id number]) [phone].pdf Page 16 of 22 9/28; patient presents for follow-up. She reports a new wound to her right heel. She states this occurred a few days ago and is progressively gotten worse. She has been trying to clean the area with a Q-tip and Santyl. She reports stability  in the other 2 wounds. She has been using gentamicin cream and silver alginate to the left foot and Santyl to the right lower extremity wound. 10/12; patient presents for follow-up. She reports improvement to the wound beds. She is seeing vein and vascular to discuss the potential of a left BKA. She states they are going to do an arteriogram. She continues to use silver alginate with dressing changes to her wounds. 11/2; patient presents for follow-up. She states she has not been doing dressing changes to the wound beds. She states she is not able to offload  the areas. She reports chronic pain to her left foot wound. 11/9; patient presents for follow-up. She came in with only socks on. She states she forgot to put on shoes. It is unclear if she is doing any dressing changes. She currently denies systemic signs of infection. 11/16; patient presents for follow-up. She came again only with socks on. She states she does not wear shoes ever. It is unclear if she does dressing changes. She currently denies systemic signs of infection. 11/23; patient presents for follow-up. She wore her shoes today. It still unclear exactly what dressing she is using for each wound but she did states she obtained Dakin's solution and has been using this to the left foot wound. She currently denies signs of infection. 11/30; patient presents for follow-up. She has no issues or complaints today. She currently denies signs of infection. 12/7; patient presents for follow-up. She has no issues or complaints today. She has been using Hydrofera Blue to the right heel wound and Dakin solution to the left foot wound. Her right anterior leg wound is healed. She currently denies signs of infection. 12/14; patient presents for follow-up. She has been using Hydrofera Blue to the right heel and Dakin's to the left foot wounds. She has no issues or complaints today. She denies signs of infection. 12/21; patient presents for follow-up. She reports using Hydrofera Blue to the right heel and Dakin's to the left foot wound. She denies signs of infection. 12/28; patient presents for follow-up. She continues to use Dakin's to the left foot wound and Hydrofera Blue to the right heel wound. She denies signs of infection. 1/4; patient presents for follow-up. She has no issues or complaints today. She denies signs of infection. 1/11; patient presents for follow-up. It is unclear if she has been dressing these wounds over the past week. She currently denies signs of infection. 1/18; patient presents  for follow-up. She states she has been using Dakin's wet-to-dry dressings to the left foot. She has been using Hydrofera Blue to the right foot foot wound. She states that the anterior right leg wound has reopened and draining serous fluid. She denies signs of infection. 1/25; patient presents for follow-up. She has no issues or complaints today. 2/1; patient presents for follow-up. She has no issues or complaints today. She denies signs of infection. 2/8; patient presents for follow-up. She has lost her surgical shoes. She did not have a dressing to the right heel wound. She currently denies signs of infection. 2/15; patient presents for follow-up. She reports more pain to the right heel today. She denies purulent drainage Or fever/chills 2/22; patient presents for follow-up. She reports taking clindamycin over the past week. She states that she continues to have pain to her right heel. She reports purulent drainage. Readmission 03/16/2022 Ms. Dewayne Burland is a 47 year old female with a past medical history of type 2 diabetes, osteomyelitis to her feet, chronic  systolic heart failure and bipolar disorder that presents to the clinic for bilateral feet wounds and right lower extremity wound. She was last seen in our clinic on 12/15/2021. At that time she had purulent drainage coming out of her right plantar foot and I recommended she go to the ED. She states she went to [facility] and has been there for the past 3 months. I cannot see the records. She states she had OR debridement and was on several weeks of IV antibiotics while inpatient. Since discharge she has not been taking care of the wound beds. She had nothing on her feet other than socks today. She currently denies signs of infection. 5/31; patient presents for follow-up. She has been using Dakin's wet-to-dry dressings to the wound beds on her feet bilaterally and antibiotic ointment to the right anterior leg wound. She had a  wound culture done at last clinic visit that showed moderate Pseudomonas aeruginosa sensitive to ciprofloxacin. She currently denies systemic signs of infection. 6/14; patient presents for follow-up. She received Keystone 5 days ago and has been using this on the wound beds. She states that last week she had to go to the hospital because she had increased warmth and erythema to the right foot. She was started on 2 oral antibiotics. She states she has been taking these. She currently denies systemic signs of infection. She has no issues or complaints today. 6/21; patient presents for follow-up. She states she has been using Keystone antibiotics to the wound beds. She has no issues or complaints today. She denies signs of infection. 6/28; patient presents for follow-up. She has been using Keystone antibiotics to the wound beds. She has no issues or complaints today. 7/12; patient presents for follow-up. Has been using Keystone antibiotics to the wound beds with calcium alginate. She has no issues or complaints today. She never followed up with her orthopedic surgeon who did the OR debridement to the right foot. We discussed the total contact cast for the left foot and patient would like to do this next week. 7/19; patient presents for follow-up. She has been using Keystone antibiotics with calcium alginate to the wound beds. She has no issues or complaints today. Patient is in agreement to do the total contact cast of the left foot today. She knows to return later this week for the obligatory cast change. 05-13-2022 upon evaluation today patient's wound which she has the cast of the left leg actually appears to be doing significantly better. Fortunately I do not see any signs of active infection locally or systemically which is great news and overall I am extremely pleased with where we stand currently. 7/26; patient presents for follow-up. She has a cast in place for the past week. She states it  irritated her shin. Other than that she tolerated the cast well. She states she would like a break for 1 week from the cast. We have been using Keystone antibiotic and Aquacel to both wound beds. She denies signs of infection. 8/2; patient presents for follow-up. She has been using Keystone and Aquacel to the wound beds. She denies any issues and has no complaints. She is agreeable to have the cast placed today for the left leg. 06-03-2022 upon evaluation today patient appears to be doing well with regard to her wound she saw some signs of improvement which is great news. Fortunately I do not see any evidence of active infection locally or systemically at this time which is great news. No fevers,  chills, nausea, vomiting, or diarrhea. ERICCA, ZUHLKE A ([id number]) [phone].pdf Page 17 of 22 8/16; patient presents for follow-up. She has no issues or complaints today. We have been using Keystone and Aquacel to the wound beds. The left lower extremity is in a total contact cast. She is tolerated this well. 8/23; patient presents for follow-up. She has had the total contact cast on the left leg for the past week. Unfortunately this has rubbed and broken down the skin to the medial foot. She currently denies signs of infection. She has been using Keystone antibiotic to the right plantar foot wound. 8/30; patient presents for follow-up. We have held off on the total contact cast for the left leg for the past week. Her wound on the left foot has improved and the previous surrounding breakdown of skin has epithelialized. She has been using Keystone antibiotic to both wound beds. She has no issues or complaints today. She denies signs of infection. 9/6; patient presents for follow-up. She has ordered her's Keystone antibiotic and this is arriving this week. She has been doing Dakin's wet-to-dry dressings to the wound beds. She denies signs of infection. She is agreeable to the total  contact cast today. 9/13; patient presents for follow-up. She states that the cast caused her left leg shin to be sore. She would like to take a break from the cast this week. She has been using Keystone antibiotic to the right plantar foot wound. She denies signs of infection. 9/20; patient presents for follow-up. She has been using Keystone antibiotics to the wound beds with calcium alginate to the right foot wound and Hydrofera Blue to the left foot wound. She is agreeable to having the cast placed today. She has been approved for Apligraf and we will order this for next clinic visit. 9/27; patient presents for follow-up. We have been using Keystone antibiotic with Hydrofera Blue to the left foot wound under a total contact cast. T the right o foot wound she has been using Keystone antibiotic and calcium alginate. She declines a total contact cast today. Apligraf is available for placement and she would like to proceed with this. 07-28-2022 upon evaluation today patient appears to be doing well currently in regard to her wound. She is actually showing signs of significant improvement which is great news. Fortunately I do not see any evidence of active infection locally nor systemically at this time. She has been seeing Dr. Heber Roslyn and to be honest has been doing very well with the cast. Subsequently she comes in today with a cast on and we did reapply that today as well. She did not really want to she try to talk me out of that but I explained that if she wanted to heal this is really the right way to go. Patient voiced understanding. In regard to her right foot this is actually a lot better compared to the last time I saw her which is also great news. 10/11; patient presents for follow-up. Apligraf and the total contact cast was placed to the left leg at last clinic visit. She states that her right foot wound had burning pain to it with the placement of Apligraf to this area. She has been doing  [place] over this area. She denies signs of infection including increased warmth, erythema or purulent drainage. 11/1; 3-week follow-up. The patient fortunately did not have a total contact cast or an Apligraf and on the left foot. She has been using Keystone ABD pads and kerlix and her  own running shoes She arrives in clinic today with thick callus and a very poor surface on the left foot on the right nonviable skin subcutaneous tissue and a deep probing hole. 11/15; patient missed her last clinic appointment. She states she has not been dressing the wound beds for the past 2 weeks. She states that at she had a new roommate but is now going back to live with her mother. Apparently its been a distracting 2 weeks. Patient currently denies signs of infection. 11/22; patient presents for follow-up. She states she has been using Keystone antibiotic and Dakin's wet-to-dry dressings to the wound beds. She is agreeable for cast placement today. We had ordered Apligraf however this has not been received by our facility. 11/29; Patient had a total contact cast placed at last clinic visit and she tolerated this well. We were using silver alginate under the cast. Patient's been using Keystone antibiotic with Aquacel to the right plantar foot wound. She has no issues or complaints today. Apligraf is available for placement today. Patient would like to proceed with this. 12/6; patient presents for follow-up. She had Apligraf placed in standard fashion last clinic visit under the total contact cast to the left lower extremity. She has been using Keystone antibiotic and Aquacel to the right plantar foot wound. She has no issues or complaints today. 12/13; patient presents for follow-up. She has finished 5 Apligraf placements. Was told she would not qualify for more. We have been doing a total contact cast to the left lower extremity. She has been using Keystone antibiotic and Aquacel to the right plantar foot  wound. She has no issues or complaints today. 12/20; patient presents for follow-up. We have been using Hydrofera Blue with Keystone antibiotic under a total contact cast of the left lower extremity. She reports using Keystone antibiotic and silver alginate to the right heel wound. She has no issues or complaints today. 12/27; patient presents with a healthy wound on the left midfoot. We have Apligraf to apply that to that more also using a total contact cast. On the right we are using Keystone and silver alginate. She is offloading the right heel with a surgical shoealthough by her admission she is on her feet quite a bit 1/3; patient presents for follow-up. Apligraf was placed to the wound beds last clinic visit. She was placed in a total contact cast to the left lower extremity. She declines a total contact cast today. She states that her mother is in the hospital and she cannot adequately get around with the cast on. 1/10; patient presents for follow-up. She declined the total contact cast at last clinic visit. Both wounds have declined in appearance. She states that she has been on her feet and not offloading the wound beds. She currently denies signs of infection. 1/17; patient presents for follow-up. She had the total contact cast along with Apligraf placed last week to the left lower extremity. She tolerated this well. She has been using Aquacel Ag and Keystone antibiotic to the right heel wound. She currently denies signs of infection. She has no issues or complaints today. 1/24; patient presents for follow-up. We have been using Apligraf to the left foot wound along with a total contact cast. She has done well with this. T the right o heel wound she has been using Aquacel Ag and Keystone antibiotic ointment. She has no issues or complaints today. She denies signs of infection. 1/31; patient presents for follow-up. We have been using Apligraf  to the left foot wound along with the total contact  cast. She continues to do well with this. To the right heel we have been using Aquacel Ag and Keystone antibiotic ointment. She has no issues or complaints today. 12-01-2022 upon evaluation patient is seen today on my schedule due to the fact that she unfortunately was in the hospital yesterday. Her cast needed to come off the only reason she is out of the hospital is due to the fact that they would not take it off at the hospital which is somewhat bewildered reading to me to be perfectly honest. I am not certain why this was but either way she was released and then was placed on my schedule today in order to get this off and reapply the total contact casting as appropriate. I do not have an Apligraf for her today it was applied last week and today's actually expired yesterday as there was some scheduling conflicts with her being in the hospital. Nonetheless we do not have that for reapplication today but the good news that she is not draining too much and the Apligraf can go for up to 2 weeks so I am going to go ahead and reapply the total contact casting but we are going to leave the Apligraf in place. 2/14; patient presents for follow-up. T the left leg she has had the total contact cast and Apligraf for the past week. She has had no issues with this. T the o o right heel she has been using antibiotic ointment and Aquacel Ag. 2/21; patient presents for follow-up. We have been using Apligraf and a total contact cast to the left lower extremity. She is tolerated this well. Unfortunately she is not approved for any more Apligraf per insurance. She has been using antibiotic ointment and Aquacel Ag to the right foot. She has no issues or complaints today. 2/28; patient presents for follow-up. We have been using Hydrofera Blue and antibiotic ointment under the total contact cast to the left lower extremity. He has been using Aquacel Ag with antibiotic ointment to the right plantar foot. She has no issues  or complaints today. JERMERIA, TELLES A ([id number]) [phone].pdf Page 18 of 22 Objective Constitutional Vitals Time Taken: 11:04 AM, Height: 69 in, Weight: 178 lbs, BMI: 26.3, Temperature: 98.6 F, Pulse: 59 bpm, Respiratory Rate: 16 breaths/min, Blood Pressure: 138/76 mmHg. General Notes: Right foot: T the plantar heel there is an incision site with increased depth, No probing to bone. T the opening there is Nonviable tissue and o o granulation tissue. Left foot: T the medial aspect there is an open wound with granulation tissue, Callus and nonviable tissue. No overt signs of infection to o any of the wound beds. Integumentary (Hair, Skin) Wound #11 status is Open. Original cause of wound was Surgical Injury. The date acquired was: 12/01/2021. The wound has been in treatment 40 weeks. The wound is located on the [place]. The wound measures 0.6cm length x 0.6cm width x 0.4cm depth; 0.283cm^2 area and 0.113cm^3 volume. There is Fat Layer (Subcutaneous Tissue) exposed. There is a medium amount of serosanguineous drainage noted. The wound margin is distinct with the outline attached to the wound base. There is medium (34-66%) pale granulation within the wound bed. There is a medium (34-66%) amount of necrotic tissue within the wound bed. Wound #12 status is Open. Original cause of wound was Pressure Injury. The date acquired was: 03/16/2020. The wound has been in treatment 40 weeks. The wound is  located on the [place]. The wound measures 1.1cm length x 1cm width x 0.1cm depth; 0.864cm^2 area and 0.086cm^3 volume. There is Fat Layer (Subcutaneous Tissue) exposed. There is a medium amount of serous drainage noted. The wound margin is flat and intact. There is small (1-33%) red, pink granulation within the wound bed. There is a large (67-100%) amount of necrotic tissue within the wound bed including Adherent Slough. 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 I debrided nonviable tissue. I recommended continue Hydrofera Blue with antibiotic ointment under the total contact cast to the left lower extremity. She does not want to use Keystone antibiotic ointment anymore and so we will go ahead and prescribe her gentamicin. She can continue with Aquacel Ag to the right foot also. Continue aggressive offloading with offloading heel shoe to the right foot. Follow-up in 1 week. Procedures Wound #11 Pre-procedure diagnosis of Wound #11 is an Open Surgical Wound located on the Right,Plantar Foot . There was a Excisional Skin/Subcutaneous Tissue Debridement with a total area of 1 sq cm performed by Kalman Shan, MD. With the following instrument(s): Curette to remove Viable and Non-Viable tissue/material. Material removed includes Callus, Subcutaneous Tissue, and Slough. A time out was conducted at 11:33, prior to the start of the procedure. A Minimum amount of bleeding was controlled with Pressure. The procedure was tolerated well. Post Debridement Measurements: 0.6cm length x 0.6cm width x 0.4cm depth; 0.113cm^3 volume. Character of Wound/Ulcer Post Debridement is stable. 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 There was a Selective/Open Wound Non-Viable Tissue Debridement with a total area of 16 sq cm performed by Kalman Shan, MD. With the  following instrument(s): Curette to remove Viable and Non-Viable tissue/material. Material removed includes Callus and Slough and. A time out was conducted at 11:30, prior to the start of the procedure. A Minimum amount of bleeding was controlled with Pressure. The procedure was tolerated well. Post Debridement Measurements: 1.1cm length x 1cm width x 0.1cm depth; 0.086cm^3 volume. Character of Wound/Ulcer Post Debridement is stable. Severity of Tissue Post Debridement is: Fat layer exposed. 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 There was a T Contact otal Cast Procedure by Kalman Shan, MD. Post procedure Diagnosis Wound #12: Same as Pre-Procedure Plan MARDENE, NEESER A ([id number]) [phone].pdf Page 19 of 22 Follow-up Appointments: Return Appointment in 1 week. Nurse Visit as needed Bathing/ Shower/ Hygiene: May shower with wound dressing protected with water repellent cover or cast protector. No tub bath. Anesthetic (Use 'Patient Medications' Section for Anesthetic Order Entry): Lidocaine applied to wound bed Edema Control - Lymphedema / Segmental Compressive Device / Other: Elevate, Exercise Daily and Avoid Standing for Long Periods of Time. Elevate legs to the level of the heart and pump ankles as often as possible Elevate leg(s) parallel to the floor when sitting. Off-Loading: T Contact Cast to Left Lower Extremity otal Open toe surgical  shoe - Heel off loader - Right foot Other: - keep pressure off of feet. Additional Orders / Instructions: Follow Nutritious Diet and Increase Protein Intake Other: - Stay off of foot, wheelchair only Medications-Please add to medication list.: Wound #11 Right,Plantar Foot: Keystone Compound - use at home Topical Antibiotic - apply Gentamycin in office only The following  medication(s) was prescribed: gentamicin topical 0.1 % cream 1 apply once daily starting 12/21/2022 WOUND #11: - Foot Wound Laterality: Plantar, Right Cleanser: Wound Cleanser 1 x Per Day/30 Days Discharge Instructions: Wash your hands with soap and water. Remove old dressing, discard into plastic bag and place into trash. Cleanse the wound with Wound Cleanser prior to applying a clean dressing using gauze sponges, not tissues or cotton balls. Do not scrub or use excessive force. Pat dry using gauze sponges, not tissue or cotton balls. Topical: Gentamicin 1 x Per Day/30 Days Discharge Instructions: Apply as directed by provider. in clinic Prim Dressing: Aquacel Extra Hydrofiber Dressing, 2x2 (in/in) 1 x Per Day/30 Days ary Secondary Dressing: ABD Pad 5x9 (in/in) (Generic) 1 x Per Day/30 Days Discharge Instructions: Cover with ABD pad Secured With: Medipore T - 60M Medipore H Soft Cloth Surgical T ape ape, 2x2 (in/yd) 1 x Per Day/30 Days Secured With: [employer] Sterile or Non-Sterile 6-ply 4.5x4 (yd/yd) 1 x Per Day/30 Days Discharge Instructions: Apply Kerlix as directed WOUND #12: - Foot Wound Laterality: Plantar, Left, Medial Cleanser: Wound Cleanser 1 x Per Day/30 Days Discharge Instructions: Wash your hands with soap and water. Remove old dressing, discard into plastic bag and place into trash. Cleanse the wound with Wound Cleanser prior to applying a clean dressing using gauze sponges, not tissues or cotton balls. Do not scrub or use excessive force. Pat dry using gauze sponges, not tissue or cotton balls. Topical: Gentamicin 1 x Per Day/30 Days Discharge Instructions: Apply as directed by provider. in clinic Prim Dressing: Hydrofera Blue Ready Transfer Foam, 2.5x2.5 (in/in) 1 x Per Day/30 Days ary Discharge Instructions: Apply Hydrofera Blue Ready to wound bed as directed Prim Dressing: Silvercel 4 1/4x 4 1/4 (in/in) 1 x Per Day/30 Days ary Discharge Instructions: Apply to  periwound Secondary Dressing: ABD Pad 5x9 (in/in) (Generic) 1 x Per Day/30 Days Discharge Instructions: Cover with ABD pad Secured With: Medipore T - 60M Medipore H Soft Cloth Surgical T ape ape, 2x2 (in/yd) 1 x Per Day/30 Days Secured With: Kerlix Roll Sterile or Non-Sterile 6-ply 4.5x4 (yd/yd) 1 x Per Day/30 Days Discharge Instructions: Apply Kerlix as directed 1. In office sharp debridement 2. Aquacel Ag and antibiotic ointment to the right foot 3. T contact cast placed in standard fashion otal 4. Hydrofera Blue with antibiotic ointment 5. Follow-up in 1 week Electronic Signature(s) Signed: 12/21/2022 1:53:20 PM By: Kalman Shan DO Entered By: Kalman Shan on 12/21/2022 12:55:23 -------------------------------------------------------------------------------- ROS/PFSH Details Patient Name: Date of Service: [name], Cj A. 12/21/2022 11:00 A Elnoria Howard, Debbe A ([id number] Page 20 of 22 Medical Record Number: [id number] Patient Account Number: [id number] Date of Birth/Sex: Treating RN: [birth year] (47 y.o. [name] Primary Care Provider: Raelene Bott Other Clinician: Massie Kluver Referring Provider: Treating Provider/Extender: Eddie North in Treatment: 56 Information Obtained From Patient Ear/Nose/Mouth/Throat Medical History: Positive for: Chronic sinus problems/congestion; Middle ear problems Hematologic/Lymphatic Medical History: Positive for: Anemia Respiratory Medical History: Positive for: Chronic Obstructive Pulmonary Disease (COPD) Cardiovascular Medical History: Positive for: Congestive Heart Failure - EF 33% Endocrine Medical History: Positive for: Type II Diabetes  Time with diabetes: 14 years Treated with: Insulin, Oral agents Blood sugar tested every day: No Blood sugar testing results: Bedtime: 176 Genitourinary Medical History: Positive for: End Stage Renal  Disease Integumentary (Skin) Medical History: Positive for: History of pressure wounds Neurologic Medical History: Positive for: Neuropathy - feel and legs HBO Extended History Items Ear/Nose/Mouth/Throat: Ear/Nose/Mouth/Throat: Chronic sinus Middle ear problems problems/congestion Immunizations Pneumococcal Vaccine: Received Pneumococcal Vaccination: No Implantable Devices No devices added Family and Social History Current every day smoker; Marital Status - Single; Alcohol Use: Never; Drug Use: Prior History; Caffeine Use: Daily Electronic Signature(s) Signed: 12/21/2022 1:53:20 PM By: Kalman Shan DO Signed: 12/22/2022 6:12:13 PM By: Gretta Cool, BSN, RN, CWS, [name] RN, BSN Entered By: Kalman Shan on 12/21/2022 12:58:42 Nile Dear A ([id number] Page 21 of 22 -------------------------------------------------------------------------------- Total Contact Cast Details Patient Name: Date of Service: GUSTAVA, GHOLAR 12/21/2022 11:00 A M Medical Record Number: [id number] Patient Account Number: [id number] Date of Birth/Sex: Treating RN: February 15, 1976 (47 y.o. [name] Primary Care Provider: Raelene Bott Other Clinician: Massie Kluver Referring Provider: Treating Provider/Extender: Eddie North in Treatment: 23 T Contact Cast Applied for Wound Assessment: otal Wound #12 Left,Medial,Plantar Foot Performed By: Physician Kalman Shan, MD Post Procedure Diagnosis Same as Pre-procedure Electronic Signature(s) Signed: 12/21/2022 12:14:48 PM By: Kalman Shan DO Signed: 12/23/2022 1:35:31 PM By: Massie Kluver Entered By: Massie Kluver on 12/21/2022 11:35:27 -------------------------------------------------------------------------------- SuperBill Details Patient Name: Date of Service: Michel Harrow 12/21/2022 Medical Record Number: [id number] Patient Account Number: [id number] Date of Birth/Sex:  Treating RN: 1976/03/30 (47 y.o. [name], [name] Primary Care Provider: Raelene Bott Other Clinician: Massie Kluver Referring Provider: Treating Provider/Extender: Eddie North in Treatment: 40 Diagnosis Coding ICD-10 Codes Code Description [phone] Non-pressure chronic ulcer of other part of left foot with other specified severity 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.621 Type 2 diabetes mellitus with foot ulcer E11.42 Type 2 diabetes mellitus with diabetic polyneuropathy L97.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 of skin Facility Procedures : ROCHANDA, DIESING Code: [id number] RISTI A [phone] [id number] Description: 11042 - DEB SUBQ TISSUE 20 SQ CM/< ICD-10 Diagnosis Description 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.621 Type 2 diabetes mellitus with foot ulcer 02) [id number] - DEBRIDE  WOUND 1ST 20 SQ CM OR < ICD-10 Diagnosis Description 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ity E11.621 Type 2 diabetes mellitus with foot ulcer Modifier: _[id number]_218 1 Quantity: 1 17.pdf Page 22 of 22 Physician Procedures : CPT4 Code Description Modifier [id number] - WC PHYS SUBQ TISS 20 SQ CM ICD-10 Diagnosis Description 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.621 Type 2 diabetes mellitus with foot ulcer Quantity: 1 : [id number] - WC PHYS DEBR WO ANESTH 20 SQ CM ICD-10 Diagnosis Description 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ity E11.621 Type 2 diabetes mellitus with foot ulcer Quantity: 1 Electronic Signature(s) Signed: 12/21/2022 1:53:20 PM By: Kalman Shan DO Entered By: Kalman Shan on 12/21/2022 12:55:44

## 2022-12-28 ENCOUNTER — Encounter: Payer: Medicaid Other | Attending: Internal Medicine | Admitting: Internal Medicine

## 2022-12-28 DIAGNOSIS — E1142 Type 2 diabetes mellitus with diabetic polyneuropathy: Secondary | ICD-10-CM | POA: Insufficient documentation

## 2022-12-28 DIAGNOSIS — E11621 Type 2 diabetes mellitus with foot ulcer: Secondary | ICD-10-CM | POA: Diagnosis not present

## 2022-12-28 DIAGNOSIS — I5022 Chronic systolic (congestive) heart failure: Secondary | ICD-10-CM | POA: Insufficient documentation

## 2022-12-28 DIAGNOSIS — J449 Chronic obstructive pulmonary disease, unspecified: Secondary | ICD-10-CM | POA: Diagnosis not present

## 2022-12-28 DIAGNOSIS — F319 Bipolar disorder, unspecified: Secondary | ICD-10-CM | POA: Diagnosis not present

## 2022-12-28 DIAGNOSIS — I132 Hypertensive heart and chronic kidney disease with heart failure and with stage 5 chronic kidney disease, or end stage renal disease: Secondary | ICD-10-CM | POA: Diagnosis not present

## 2022-12-28 DIAGNOSIS — Z79899 Other long term (current) drug therapy: Secondary | ICD-10-CM | POA: Diagnosis not present

## 2022-12-28 DIAGNOSIS — L97528 Non-pressure chronic ulcer of other part of left foot with other specified severity: Secondary | ICD-10-CM | POA: Diagnosis not present

## 2022-12-28 DIAGNOSIS — L97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 of skin: Secondary | ICD-10-CM | POA: Insufficient documentation

## 2022-12-28 DIAGNOSIS — L97512 Non-pressure chronic ulcer of other part of right foot with fat layer exposed: Secondary | ICD-10-CM | POA: Insufficient documentation

## 2022-12-28 DIAGNOSIS — F17210 Nicotine dependence, cigarettes, uncomplicated: Secondary | ICD-10-CM | POA: Diagnosis not present

## 2022-12-28 DIAGNOSIS — N186 End stage renal disease: Secondary | ICD-10-CM | POA: Diagnosis not present

## 2022-12-28 DIAGNOSIS — E1122 Type 2 diabetes mellitus with diabetic chronic kidney disease: Secondary | ICD-10-CM | POA: Diagnosis not present

## 2022-12-30 NOTE — Progress Notes (Signed)
[name], [name] [name] ([id number]) [id number] Page 1 of 22 Visit Report for 12/28/2022 Chief Complaint Document Details Patient Name: Date of Service: [name], [name] 12/28/2022 8:30 [name]: [id number] Patient Account Number: [id number] Date of Birth/Sex: Treating RN: Feb 22, 1976 (47 y.o. [name] Primary Care Provider: Raelene [name] Other Clinician: Referring Provider: Treating Provider/Extender: [name] in Treatment: 24 Information Obtained from: Patient Chief Complaint 03/19/2021; patient referred by Dr. Luana [name] who has been looking after her left foot for quite [name] period of time for review of [name] nonhealing area in the left midfoot 03/12/2022; bilateral feet wounds and right lower extremity wound. Electronic Signature(s) Signed: 12/28/2022 1:18:38 PM By: Heather Shan DO Entered By: [name] on 12/28/2022 09:29:18 -------------------------------------------------------------------------------- Debridement Details Patient Name: Date of Service: [name] 12/28/2022 8:30 [name]: [id number] Patient Account Number: [id number] Date of Birth/Sex: Treating RN: 27-Feb-1976 (47 y.o. [name] Primary Care Provider: Raelene [name] Other Clinician: Referring Provider: Treating Provider/Extender: [name] in Treatment: 41 Debridement Performed for Assessment: Wound #12 Left,Medial,Plantar Foot Performed By: Physician Heather Shan, MD Debridement Type: Debridement Severity of Tissue Pre Debridement: Fat layer exposed Level of Consciousness (Pre-procedure): Awake and Alert Pre-procedure Verification/Time Out No Taken: Start Time: 09:05 T Area Debrided (L x W): otal 0.9 (cm) x 0.7 (cm) = 0.63 (cm) Tissue and other material debrided: Viable, Non-Viable, Callus, Slough, Subcutaneous, Slough Level: Skin/Subcutaneous Tissue Debridement Description:  Excisional Instrument: Curette Bleeding: Moderate Hemostasis Achieved: Pressure Response to Treatment: Procedure was tolerated well Level of Consciousness (Post- Awake and Alert procedure): [name], [name] ([id number]) [id number] Page 2 of 22 Post Debridement Measurements of Total Wound Length: (cm) 0.9 Width: (cm) 0.7 Depth: (cm) 0.3 Volume: (cm) 0.148 Character of Wound/Ulcer Post Debridement: Stable Severity of Tissue Post Debridement: Fat layer exposed Post Procedure Diagnosis Same as Pre-procedure Electronic Signature(s) Signed: 12/28/2022 1:18:38 PM By: Heather Shan DO Signed: 12/28/2022 4:47:39 PM By: [name] Entered By: [name] on 12/28/2022 09:07:09 -------------------------------------------------------------------------------- Debridement Details Patient Name: Date of Service: [name] 12/28/2022 8:30 [name]: [id number] Patient Account Number: [id number] Date of Birth/Sex: Treating RN: 1976-10-05 (47 y.o. [name] Primary Care Provider: Raelene [name] Other Clinician: Referring Provider: Treating Provider/Extender: [name] in Treatment: 41 Debridement Performed for Assessment: Wound #11 Right,Plantar Foot Performed By: Physician Heather Shan, MD Debridement Type: Debridement Level of Consciousness (Pre-procedure): Awake and Alert Pre-procedure Verification/Time Out No Taken: Start Time: 09:05 T Area Debrided (L x W): otal 0.5 (cm) x 0.3 (cm) = 0.15 (cm) Tissue and other material debrided: Viable, Non-Viable, Slough, Slough Level: Non-Viable Tissue Debridement Description: Selective/Open Wound Instrument: Curette Bleeding: Moderate Hemostasis Achieved: Pressure Response to Treatment: Procedure was tolerated well Level of Consciousness (Post- Awake and Alert procedure): Post Debridement Measurements of Total Wound Length: (cm)  0.5 Width: (cm) 0.3 Depth: (cm) 0.5 Volume: (cm) 0.059 Character of Wound/Ulcer Post Debridement: Stable Post Procedure Diagnosis Same as Pre-procedure Electronic Signature(s) Signed: 12/28/2022 1:18:38 PM By: Heather Shan DO Signed: 12/28/2022 4:47:39 PM By: [name] Entered By: [name] on 12/28/2022 09:12:30 [name] ([id number]) [id number] Page 3 of 22 -------------------------------------------------------------------------------- HPI Details Patient Name: Date of Service: [name], [name] 12/28/2022 8:30 [name]: [id number] Patient Account Number: [id number] Date of Birth/Sex: Treating RN: May 05, 1976 (46 y.o. [name] Primary Care Provider: Heber [name],  [name] Other Clinician: Referring Provider: Treating Provider/Extender: [name] in Treatment: 59 History of Present Illness HPI Description: 01/18/18-She is here for initial evaluation of the left great toe ulcer. She is [name] poor historian in regards to timeframe in detail. She states approximately 4 weeks ago she lacerated her toe on something in the house. She followed up with her primary care who placed her on Bactrim and ultimately [name] second dose of Bactrim prior to coming to wound clinic. She states she has been treating the toe with peroxide, Betadine and [name] Band-Aid. She did not check her blood sugar this morning but checked it yesterday morning it was 327; she is unaware of [name] recent A1c and there are no current records. She saw Dr. she would've orthopedics last week for an old injury to the left ankle, she states he did not see her toe, nor did she bring it to his attention. She smokes approximately 1 pack cigarettes [name] day. Her social situation is concerning, she arrives this morning with her mother who appears extremely intoxicated/under the influence; her mother was asked to leave the room and be monitored by the  patient's grandmother. The patient's aunt then accompanied the patient and the room throughout the rest of the appointment. We had [name] lengthy discussion regarding the deleterious effects of uncontrolled hyperglycemia and smoking as it relates to wound healing and overall health. She was strongly encouraged to decrease her smoking and get her diabetes under better control. She states she is currently on [name] diet and has cut down her [facility] consumption. The left toe is erythematous, macerated and slightly edematous with malodor present. The edema in her left foot is below her baseline, there is no erythema streaking. We will treat her with Santyl, doxycycline; we have ordered and xray, culture and provided [name] Peg assist surgical shoe and cultured the wound. 01/25/18-She is here in follow-up evaluation for [name] left great toe ulcer and presents with an abscess to her suprapubic area. She states her blood sugars remain elevated, feeling "sick" and if levels are below 250, but she is trying. She has made no attempt to decrease her smoking stating that we "can't take away her food in her cigarettes". She has been compliant with offloading using the PEG assist you. She is using Santyl daily. the culture obtained last week grew staph aureus and Enterococcus faecalis; continues on the doxycycline and Augmentin was added on Monday. The suprapubic area has erythema, no femoral variation, purple discoloration, minimal induration, was accessed with [name] cotton tip applicator with sanguinopurulent drainage, this was cultured, I suspect the current antibiotic treatment will cover and we will not add anything to her current treatment plan. She was advised to go to urgent care or ER with any change in redness, induration or fever. 02/01/18-She is here in follow-up evaluation for left great toe ulcers and [name] new abdominal abscess from last week. She was able to use packing until earlier this week, where she "forgot it was  there". She states she was feeling ill with GI symptoms last week and was not taking her antibiotic. She states her glucose levels have been predominantly less than 200, with occasional levels between 200-250. She thinks this was contributing to her GI symptoms as they have resolved without intervention. There continues to be significant laceration to left toe, otherwise it clinically looks stable/improved. There is now less superficial opening to the lateral aspect of the great toe that was residual blister. We will  transition to [facility] to all wounds, she will continue her Augmentin. If there is no change or deterioration next week for reculture. 02/08/18-She is here in follow-up evaluation for left great toe ulcer and abdominal ulcer. There is an improvement in both wounds. She has been wrapping her left toe with coban, not by our direction, which has created an area of discoloration to the medial aspect; she has been advised to NOT use coban secondary to her neuropathy. She states her glucose levels have been high over this last week ranging from 200-350, she continues to smoke. She admits to being less compliant with her offloading shoe. We will continue with same treatment plan and she will follow-up next week. 02/15/18-She is here in follow-up evaluation for left great toe ulcer and abdominal ulcer. The abdominal ulcer is epithelialized. The left great toe ulcer is improved and all injury from last week using the Coban wrap is resolved, the lateral ulcer is healed. She admits to noncompliance with wearing offloading shoe and admits to glucose levels being greater than 300 most of the week. She continues to smoke and expresses no desire to quit. There is one area medially that probes deeper than it has historically, erythema to the toe and dorsal foot has consistently waxed and waned. There is no overt signs of cellulitis or infection but we will culture the wound for any occult infection  given the new area of depth and erythema. We will hold off on sensitivities for initiation of antibiotic therapy. 02/22/18-She is here in follow up evaluation for left great toe ulcer. There is overall significant improvement in both wound appearance, erythema and edema with changes made last week. She was not initiated on antibiotic therapy. Culture obtained last week showed oxacillin sensitive staph aureus, sensitive to clindamycin. Clindamycin has been called into the pharmacy but she has been instructed to hold off on initiation secondary to overall clinical improvement and her history of antibiotic intolerance. She has been instructed to contact the clinic with any noted changes/deterioration and the wound, erythema, edema and/or pain. She will follow-up next week. She continues to smoke and her glucose levels remain elevated >250; she admits to compliance with offloading shoe 03/01/18 on evaluation today patient appears to be doing fairly well in regard to her left first toe ulcer. She has been tolerating the dressing changes with the [facility] Dressing without complication and overall this has definitely showed signs of improvement according to records as well is what the patient tells me today. I'm very pleased in that regard. She is having no pain today 03/08/18 She is here for follow up evaluation of [name] left great toe ulcer. She remains non-compliant with glucose control and smoking cessation; glucose levels consistently >200. She states that she got new shoe inserts/peg assist. She admits to compliance with offloading. Since my last evaluation there is significant improvement. We will switch to prisma at this time and she will follow up next week. She is noted to be tachycardic at this appointment, heart rate 120s; she has [name] history of heart rate 70-130 according to our records. She admits to extreme agitation r/t personal issues; she was advised to monitor her heartrate and contact her  physician if it does not return to [name] more normal range (<100). She takes cardizem twice daily. 03/15/18-She is here in follow-up evaluation for left great toe ulcer. She remains noncompliant with glucose control and smoking cessation. She admits to compliance with wearing offloading shoe. The ulcer is improved/stable  and we will continue with the same treatment plan and she will follow-up next week 03/22/18-She is here for evaluation for left great toe ulcer. There continues to be significant improvement despite recurrent hyperglycemia (over 500 yesterday) and she continues to smoke. She has been compliant with offloading and we will continue with same treatment plan and she will follow-up next week. 03/29/18-She is here for evaluation for left great toe ulcer. Despite continuing to smoke and uncontrolled diabetes she continues to improve. She is compliant with offloading shoe. We will continue with the same treatment plan and she will follow-up next week 04/05/18- She is here in follow up evaluation for [name] left great toe ulcer; she presents with small pustule to left fifth toe (resembles ant bite). She admits to compliance with wearing offloading shoe; continues to smoke or have uncontrolled blood glucose control. There is more callus than usual with evidence of bleeding; she denies known trauma. 04/12/18-She is here for evaluation of left great toe ulcer. Despite noncompliance with glycemic control and smoking she continues to make improvement. She continues to wear offloading shoe. The pustule, that was identified last week, to the left fifth toe is resolved. She will follow-up in 2 weeks 05/03/18-she is seen in follow-up evaluation for [name] left great toe ulcer. She is compliant with offloading, otherwise noncompliant with glycemic control and smoking. She has plateaued and there is minimal improvement noted. We will transition to [facility], replaced the insert to her surgical shoe and she will follow-up  in one week Tinkle, Samarra [name] ([id number]) [id number] Page 4 of 22 05/10/18- She is here in follow up evaluation for [name] left great toe ulcer. It appears stable despite measurement change. We will continue with same treatment plan and follow up next week. 05/24/18-She is seen in follow-up evaluation for [name] left great toe ulcer. She remains compliant with offloading, has made significant improvement in her diet, decreasing the amount of sugar/soda. She said her recent A1c was 10.9 which is lower than. She did see [name] diabetic nutritionist/educator yesterday. She continues to smoke. We will continue with the same treatment plan and she'll follow-up next week. 05/31/18- She is seen in follow-up evaluation for left great toe ulcer. She continues to remain compliant with offloading, continues to make improvement in her diet, increasing her water and decreasing the amount of sugar/soda. She does continue to smoke with no desire to quit. We will apply Prisma to the depth and Hydrofera Blue over. We have not received insurance authorization for oasis. She will follow up next week. 06/07/18-She is seen in follow-up evaluation for left great toe ulcer. It has stalled according to today's measurements although base appears stable. She says she saw [name] diabetic educator yesterday; her average blood sugars are less than 300 which is an improvement for her. She continues to smoke and states "that's my next step" She continues with water over soda. We will order for xray, culture and reinstate ace wrap compression prior to placing apligraf for next week. She is voicing no complaints or concerns. Her dressing will change to iodoflex over the next week in preparation for apligraf. 06/14/18-She is seen in follow-up evaluation for left great toe ulcer. Plain film x-ray performed last week was negative for osteomyelitis. Wound culture obtained last week grew strep B and OSSA; she is initiated on keflex and  cefdinir today; there is erythema to the toe which could be from ace wrap compression, she has [name] history of wrapping too tight and has has  been encouraged to maintain ace wraps that we place today. We will hold off on application of apligraf today, will apply next week after antibiotic therapy has been initiated. She admits today that she has resumed taking [name] shower with her foot/toe submerged in water, she has been reminded to keep foot/toe out of the bath water. She will be seen in follow up next week 06/21/18-she is seen in follow-up evaluation for left great toe ulcer. She is tolerating antibiotic therapy with no GI disturbance. The wound is stable. Apligraf was applied today. She has been decreasing her smoking, only had 4 cigarettes yesterday and 1 today. She continues being more compliant in diabetic diet. She will follow-up next week for evaluation of site, if stable will remove at 2 weeks. 06/28/18- She is here in follow up evalution. Apligraf was placed last week, she states the dressing fell off on Tuesday and she was dressing with hydrofera blue. She is healed and will be discharged from the clinic today. She has been instructed to continue with smoking cessation, continue monitoring glucose levels, offloading for an additional 4 weeks and continue with hydrofera blue for additional two weeks for any possible microscopic opening. Readmission: 08/07/18 on evaluation today patient presents for reevaluation concerning the ulcer of her right great toe. She was previously discharged on 06/28/18 healed. Nonetheless she states that this began to show signs of drainage she subsequently went to her primary care provider. Subsequently an x-ray was performed on 08/01/18 which was negative. The patient was also placed on antibiotics at that time. Fortunately they should have been effective for the infection. Nonetheless she's been experiencing some improvement but still has [name] lot of drainage coming from the  wound itself. 08/14/18 on evaluation today patient's wound actually does show signs of improvement in regard to the erythema at this point. She has completed the antibiotics. With that being said we did discuss the possibility of placing her in [name] total contact cast as of today although I think that I may want to give this just [name] little bit more time to ensure nothing recurrence as far as her infection is concerned. I do not want to put in the cast and risk infection at that time if things are not completely resolved. With that being said she is gonna require some debridement today. 08/21/18 on evaluation today patient actually appears to be doing okay in regard to her toe ulcer. She's been tolerating the dressing changes without complication. With that being said it does appear that she is ready and in fact I think it's appropriate for [place] to go ahead and initiate the total contact cast today. Nonetheless she will require some sharp debridement to prepare the wound for application. Overall I feel like things have been progressing well but we do need to do something to get this to close more readily. 08/24/18 patient seen today for reevaluation after having had the total contact cast applied on Tuesday. She seems to have done very well the wound appears to be doing great and overall I'm pleased with the progress that she's made. There were no abnormal areas of rubbing from the cast on her lower extremity. 08/30/18 on evaluation today patient actually appears to be completely healed in regard to her plantar toe ulcer. She tells me at this point she's been having [name] lot of issues with the cast. She almost fell [name] couple of times the state shall the step of her dog [name] couple times as well. This is  been [name] very frustrating process for her other nonetheless she has completely healed the wound which is excellent news. Overall there does not appear to be the evidence of infection at this time which is great  news. 09/11/18 evaluation today patient presents for follow-up concerning her great toe ulcer on the left which has unfortunately reopened since I last saw her which was only [name] couple of weeks ago. Unfortunately she was not able to get in to get the shoe and potentially the AFO that's gonna be necessary due to her left foot drop. She continues with offloading shoe but this is not enough to prevent her from reopening it appears. When we last had her in the total contact cast she did well from [name] healing standpoint but unfortunately the wound reopened as soon as she came out of the cast within just [name] couple of weeks. 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 This is also part of the reason why she falls on [name] regular basis. I just do not believe that is gonna be safe for [place] to reinitiate the total contact cast as last time we had this on she fell 3 times one week which is definitely not normal for her. 09/18/18 upon evaluation today the patient actually appears to be doing about the same in regard to her toe ulcer. She did not contact [facility] as I asked her to even though I had given her the prescription. In fact she actually states that she has no idea where the prescription is. She did apparently call [facility] and they told her that all she needed to do was bring the prescription in order to be able to be seen and work on getting the AFO for her left foot. With all that being said she still does not have an appointment and I'm not sure were things stand that regard. I will give her [name] new prescription today in order to contact them to get this set up. 09/25/18 on evaluation today patient actually appears to be doing about the same in regard to her toes ulcer. She does have [name] small areas which seems to have [name]  lot of callous buildup around the edge of the wound which is going to need sharp debridement today. She still is waiting to be scheduled for evaluation with [facility] for possibility of an AFO. She states there supposed to call her tomorrow to get this set up. Unfortunately it does appear that her foot specifically the toe area is showing signs of erythema. There does not appear to be any systemic infection which is in these good news. 10/02/18 on evaluation today patient actually appears to be doing about the same in regard to her toe ulcer. This really has not done too well although it's not significantly larger it's also not significantly smaller. She has been tolerating the dressing changes without complication. She actually has her appointment with [facility] and [place] tomorrow to hopefully be measured for obtaining and AFO splint. I think this would be helpful preventing this from reoccurring. We had contemplated starting the cast this week although to be honest I am reluctant to do that as she's been having nausea, vomiting, and seizure activity over the past three days. She has [name] history of seizures and have been  told is nothing that can be done for these. With that being said I do believe that along with the seizures have the nausea vomiting which upon further questioning doesn't seem to be the normal for her and makes me concerned for the possibility of infection or something else going on. I discussed this with the patient and her mother during the office visit today. I do not feel the wound is effective but maybe something else. The responses this was "this just happens to her at times and we don't know why". They did not seem to be interested in going to the hospital to have this checked out further. 10/09/18 on evaluation today patient presents for follow-up concerning her ongoing toe ulcer. She has been tolerating the dressing changes without complication. Fortunately there does not appear  to be any evidence of infection which is great news however I do think that the patient would benefit from going ahead for with the total contact cast. She's actually in [name] wheelchair today she tells me that she will use her walker if we initiate the cast. I was very specific about the fact that if we were gonna do the cast I wanted to make sure that she was using the walker in order to prevent any falls. She tells me she does not have stairs that she has to traverse on [name] regular basis at her home. She has not had any seizures since last week again that something that happens to her often she tells me she did talk to [employer] and they said that it may take up to three weeks to get the brace approved for her. Hopefully that will not take that long but nonetheless in the meantime I do think the cast could be of benefit. 10/12/18 on evaluation today patient appears to be doing rather well in regard to her toe ulcer. It's just been [name] few days and already this is significantly improved both as far as overall appearance and size. Fortunately there's no sign of infection. She is here for her first obligatory cast change. 10/19/18 Seen today for follow up and management of left great toe ulcer. Wound continues to show improvement. Noted small open area with seroussang drainage with palpation. Denies any increased pain or recent fevers during visit. She will continue calcium alginate with offloading shoe. Denies any questions [name], [name] ([id number]) [id number] Page 5 of 22 or concerns during visit. 10/26/18 on evaluation today patient appears to be doing about the same as when I last saw her in regard to her wound bed. Fortunately there does not appear to be any signs of infection. Unfortunately she continues to have [name] breakdown in regard to the toe region any time that she is not in the cast. It takes almost no time at all for this to happen. Nonetheless she still has not heard  anything from the brace being made by [employer] as to when exactly this will be available to her. Fortunately there is no signs of infection at this time. 10/30/18 on evaluation today patient presents for application of the total contact cast as we just received him this morning. Fortunately we are gonna be able to apply this to her today which is great news. She continues to have no significant pain which is good news. Overall I do feel like things have been improving while she was the cast is when she doesn't have [name] cast that things get worse. She still has not really heard anything from [employer] regarding  her brace. 11/02/18 upon evaluation today patient's wound already appears to be doing significantly better which is good news. Fortunately there does not appear to be any signs of infection also good news. Overall I do think the total contact cast as before is helping to heal this area unfortunately it's just not gonna likely keep the area closed and healed without her getting her brace at least. Again the foot drop is [name] significant issue for her. 11/09/18 on evaluation today patient appears to be doing excellent in regard to her toe ulcer which in fact is completely healed. Fortunately we finally got the situation squared away with the paperwork which was needed to proceed with getting her brace approved by Medicaid. I have filled that out unfortunately that information has been sent to the orthopedic office that I worked at 2 1/2 years ago and not tired Current wound care measures. Fortunately she seems to be doing very well at this time. 11/23/18 on evaluation today patient appears to be doing More Poorly Compared to Last Time I Saw Her. At [facility] She Had Completely Healed. Currently she is continuing to have issues with reopening. She states that she just found out that the brace was approved through Medicaid now she just has to go get measured in order to have this fitted for her and then made.  Subsequently she does not have an appointment for this yet that is going to complicate things we obviously cannot put her back in the cast if we do not have everything measured because they're not gonna be able to measure her foot while she is in the cast. 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 Obviously this is unfortunate and does have me somewhat worried as well. 11/30/18 on evaluation today patient's toe ulcer actually appears to be doing fairly well. The good news is she will be getting her brace in the shoes next week on Wednesday. Hopefully we will be able to get this to heal without having to go back in the cast however she may need the cast in order to get the wound completely heal and then go from there. Fortunately there's no signs of infection at this time. 12/07/18 on evaluation today patient fortunately did receive her brace and she states she could tell this definitely makes her walk better. With that being said she's been having issues with her toe where she noticed yesterday there was [name] lot of tissue that was loosing off this appears to be much larger than what it was previous. She also states that her leg has been read putting much across the top of her foot just about the ankle although this seems to be receiving somewhat. The total area is still red and appears to be someone infected as best I can tell. She is previously taken Bactrim and that may be [name] good option for her today as well. We are gonna see what I wound culture shows as well and I think that this is definitely appropriate. With that being said outside of the culture I still need to initiate something in the interim and that's what I'm gonna go ahead and select Bactrim is [name] good option for her. 12/14/18 on evaluation today patient appears to be doing better in regard to her left great toe ulcer as compared to last week's  evaluation. There's still some erythema although this is  significantly improved which is excellent news. Overall I do believe that she is making good progress is still gonna take some time before she is where I would like her to be from the standpoint of being able to place her back into the total contact cast. Hopefully we will be where we need to be by next week. 12/21/18 on evaluation today patient actually appears to be doing poorly in regard to her toe ulcer. She's been tolerating the dressing changes without complication. Fortunately there's no signs of systemic infection although she does have [name] lot of drainage from the toe ulcer and this does seem to be causing some issues at this point. She does have erythema on the distal portion of her toe that appears to be likely cellulitis. 12/28/18 on evaluation today patient actually appears to be doing [name] little better in my pinion in regard to her toe ulcer. With that being said she still does have some evidence of infection at this time and for her culture she had both E. coli as well as enterococcus as organisms noted on evaluation. For that reason I think that though the Keflex likely has treated the E. coli rather well this has really done nothing for the enterococcus. We are going to have to initiate treatment for this specifically. 01/04/19 on evaluation today patient's toe actually appears to be doing better from the standpoint of infection. She currently would like to see about putting the cash back on I think that this is appropriate as long as she takes care of it and keeps it from getting wet. She is gonna have some drainage we can definitely pass this up with Drawtex and alginate to try to prevent as much drainage as possible from causing the problems. With that being said I do want to at least try her with the cast between now and Tuesday. If there any issues we can't continue to use it then I will discontinue the use of the cast at that  point. 01/08/19 on evaluation today patient actually appears to be doing very well as far as her foot ulcer specifically the great toe on the left is concerned. She did have an area of rubbing on the medial aspect of her left ankle which again is from the cast. Fortunately there's no signs of infection at this point in this appears to be [name] very slight skin breakdown. The patient tells me she felt it rubbing but didn't think it was that bad. Fortunately there is no signs of active infection at this time which is good news. No fevers, chills, nausea, or vomiting noted at this time. 01/15/19 on evaluation today patient actually appears to be doing well in regard to her toe ulcer. Again as previous she seems to do well and she has the cast on which indicates to me that during the time she doesn't have [name] cast on she's putting way too much pressure on this region. Obviously I think that's gonna be an issue as with the current national emergency concerning the Covid-19 Virus it has been recommended that we discontinue the use of total contact casting by the chief medical officer of our company, Dr. Simona Huh. The reasoning is that if [name] patient becomes sick and cannot come into have the cast removed they could not just leave this on for an additional two weeks. Obviously the hospitals also do not want to receive patient's who are sick into the emergency department to potentially contaminate the region and spread the Covid-19  Virus among other sick individuals within the hospital system. Therefore at this point we are suspending the use of total contact cast until the current emergency subsides. This was all discussed with the patient today as well. 01/22/19 on evaluation today patient's wound on her left great toe appears to be doing slightly worse than previously noted last week. She tells me that she has been on this quite [name] bit in fact she tells me she's been awake for 38 straight hours. This is due to the fact  that she's having to care for grandparents because nobody else will. She has been taking care of them for five the last seven days since I've seen her they both have dementia his is from [name] stroke and her grandmother's was progressive. Nonetheless she states even her mom who knows her condition and situation has only help two of those days to take care of them she's been taking care of the rest. Fortunately there does not appear to be any signs of active infection in regard to her toe at this point although obviously it doesn't look as good as it did previous. I think this is directly related to her not taking off the pressure and friction by way of taking things easy. Though I completely understand what's going on. 01/29/19 on evaluation today patient's tools are actually appears to be showing some signs of improvement today compared to last week's evaluation as far as not necessarily the overall size of the wound but the fact that she has some new skin growth in between the two ends of the wound opening. Overall I feel like she has done well she states that she had [name] family member give her what sounds to be [name] CAM walker boot which has been helpful as well. 02/05/19 on evaluation today patient's wound bed actually appears to be doing significantly better in regard to her overall appearance of the size of the wound. With that being said she is still having an issue with offloading efficiently enough to get this to close. Apparently there is some signs of infection at this point as well unfortunately. Previously she's done well of Augmentin I really do not see anything that needs to be culture currently but there theme and cellulitis of the foot that I'm seeing I'm gonna go ahead and place her on an antibiotic today to try to help clear this up. 02/12/2019 on evaluation today patient actually appears to be doing poorly in regard to her overall wound status. She tells me she has been using her offloading shoe  but actually comes in today wearing her tennis shoe with the AFO brace. Again as I previously discussed with her this is really not sufficient to allow the area to heal appropriately. Nonetheless she continues to be somewhat noncompliant and I do wonder based on what she has told my nurse in the past as to whether or not [name] good portion of this noncompliance may be recreational drug and alcohol related. She has had [name] history of heroin overdose and this was fairly recently in the past couple of months that have been seeing her. Nonetheless overall I feel like her wound looks significantly worse today compared to what it [name], [name] ([id number]) [id number] Page 6 of 22 was previous. She still has significant erythema despite the Augmentin I am not sure that this is an appropriate medication for her infection I am also concerned that the infection may have gone down into her bone. 02/19/19 on evaluation  today patient actually appears to be doing about the same in regard to her toe ulcer. Unfortunately she continues to show signs of bone exposure and infection at this point. There does not appear to be any evidence of worsening of the infection but I'm also not really sure that it's getting significantly better. She is on the Augmentin which should be sufficient for the Staphylococcus aureus infection that she has at this point. With that being said she may need IV antibiotics to more appropriately treat this. We did have [name] discussion today about hyperbaric option therapy. 02/28/19 on evaluation today patient actually appears to be doing much worse in regard to the wound on her left great toe as compared to even my previous evaluation last week. Unfortunately this seems to be training in [name] pretty poor direction. Her toe was actually now starting to angle laterally and I can actually see the entire joint area of the proximal portion of the digit where is the distal portion of the  digit again is no longer even in contact with the joint line. Unfortunately there's [name] lot more necrotic tissue around the edge and the toe appears to be showing signs of becoming gangrenous in my pinion. I'm very concerned about were things stand at this point. She did see infectious disease and they are planning to send in [name] prescription for Sivextro for her and apparently this has been approved. With that being said I don't think she should avoid taking this but at the same time I'm not sure that it's gonna be sufficient to save her toe at this point. She tells me that she still having to care for grandparents which I think is putting quite [name] bit of strain on her foot and specifically the total area and has caused this to break down even to [name] greater degree than would've otherwise been expected. 03/05/19 on evaluation today patient actually appears to be doing quite well in regard to her toe all things considering. She still has bone exposed but there appears to be much less your thing on overall the appearance of the wound and the toe itself is dramatically improved. She still does have some issues currently obviously with infection she did see vascular as well and there concerned that her blood flow to the toad. For that reason they are setting up for an angiogram next week. 03/14/19 on evaluation today patient appears to be doing very poor in regard to her toe and specifically in regard to the ulceration and the fact that she's starting to notice the toe was leaning even more towards the lateral aspect and the complete joint is visible on the proximal aspect of the joint. Nonetheless she's also noted [name] significant odor and the tip of the toe is turning more dark and necrotic appearing. Overall I think she is getting worse not better as far as this is concerned. For that reason I am recommending at this point that she likely needs to be seen for likely amputation. READMISSION 03/19/2021 This is [name]  patient that we cared for in this clinic for [name] prolonged period of time in 2019 and 2020 with [name] left foot and left first toe wound. 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 Luana [name]. In December 2021 she had an ulcer on her right great toe as well as the fourth and fifth toes. She underwent [name]  partial ray amputation of the right fourth and fifth toes. She also had an angiogram at that time and underwent angioplasty of the right anterior tibial artery. In any case she claims that the wound on the right foot is closed I did not look at this today which was probably an oversight although I think that should be done next week. 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 According to Dr. Deborra Medina last review that she was out of the area being cared for by another physician but recently came back to his attention. 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 [name] culture of this area showed E. coli apparently before she came back to see Dr. Luana [name] she was supposed to be receiving antibiotics but she did not really take them. Nor is she offloading this area at all. Finally her last hemoglobin A1c listed in epic was in March 2022 at 14.1 she says things are [name] lot better since then although I am not sure. 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 She had this wound at the time. During this admission she had E. coli sepsis [name] TEE was negative. 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 She is still [name] heavy  cigarette smoker. She has [name] history of bipolar disorder seizure disorder ABI in our clinic was 1.05 6/1; left midfoot in the setting of [name] TMA done previously. Round circular wound with [name] "knuckle" of protruding tissue. The problem is that the knuckle was not attached to any of the surrounding granulation and this probed proximally widely I removed [name] large portion of this tissue. This wound goes with considerable undermining laterally. I do not feel any bone there was no purulence but this is [name] deep wound. 6/8; in spite of the debridement I did last week. She arrives with [name] wound looking exactly the same. [name] protruding "knuckle" of tissue nonadherent to most of the surrounding tissue. There is considerable depth around this from 6-12 o'clock at 2.7 cm and undermining of 1 cm. This does not look overtly infected and the x- ray I did last week was negative for any osseous abnormalities. We have been using silver collagen 6/15; deep tissue culture I did last week showed moderate staph aureus and moderate Pseudomonas. This will definitely require prolonged antibiotic therapy. The pathology on the protuberant area was negative for malignancy fungus etc. 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 We have been using silver collagen. I am going to be prescribing Levaquin for 2 weeks. Her CT scan of the foot is down for 7/5 6/22; CT scan of the foot on 7 5. She says she has hardware in the left leg from her previous fracture. She is on the Levaquin for the deep tissue culture I did that showed methicillin sensitive staph aureus and Pseudomonas. I gave her [name] 2-week supply and she will have another week. She arrives in clinic today with the same protuberant tissue however this is nonadherent to the tissue surrounding it. I am really at [name] loss to explain this unless there is underlying deep tissue infection 6/29; patient presents for 1 week follow-up. She  has been using collagen to the wound bed. She reports taking her antibiotics as prescribed.She has no complaints or issues today. She denies signs of infection. 7/6; patient presents for one week followup. She has been using collagen to the wound bed. She states she is taking Levaquin however at times she is not able to keep it down. She denies signs of infection. 7/13; patient presents for 1 week follow-up. She has been using silver alginate to the wound bed. She still has nausea when taking her antibiotics. She denies signs of infection. 7/20; patient presents for 1 week follow-up. She has been using silver alginate with gentamicin cream to the wound bed. She denies any issues and has no complaints today. She denies signs of infection. 7/27; patient presents for 1 week follow-up. She continues to use silver alginate with gentamicin cream to the wound bed. She reports starting her antibiotics. She has no issues or complaints. Overall she reports stability to the wound. 8/3; patient presents for 1 week follow-up. She has been using silver alginate with gentamicin cream to the wound bed. She reports completing all antibiotics. She has no issues or complaints today. She denies signs of infection. 8/17; patient presents for 2-week follow-up. He is to use silver alginate to the wound bed. She has no issues or complaints today. She denies signs of infection. She reports her pain has improved in her foot since last clinic visit 8/24; patient presents for 1 week follow-up. She continues to use silver alginate to the wound bed. She has no issues or complaints. She denies signs of infection. Pain is stable. 9/7; patient presents for follow-up. She missed her last week appointment due to feeling ill. She continues to use silver alginate. She has [name] new wound to the right lower extremity that is covered in eschar. She states It occurred over the past week and has no idea how it started. She currently denies  signs of infection. 9/14; patient presents for follow-up. T the left foot wound she has been using gentamicin cream and silver alginate. T the right lower extremity wound she has o o been keeping this covered and has not obtain Santyl. 9/21; patient presents for follow-up. She reports using gentamicin cream and silver alginate to the left foot and Santyl to the right lower extremity wound. She has no issues or complaints today. She denies signs of infection. 9/28; patient presents for follow-up. She reports [name] new wound to her right heel. She states this occurred [name] few days ago and is progressively gotten worse. She has been trying to clean the area with [name] Q-tip and Santyl. She reports stability in the other 2 wounds. She has been using gentamicin cream and silver alginate to the left foot and Santyl to the right lower extremity wound. 10/12; patient presents for follow-up. She reports improvement to the wound beds. She is seeing vein and vascular to discuss the potential of [name] left BKA. She states they are going to do an arteriogram. She continues to use silver alginate with dressing changes to her wounds. [name], [name] ([id number]) [id number] Page 7 of 22 11/2; patient presents for follow-up. She states she has not been doing dressing changes to the wound beds. She states she is not able to offload the areas. She reports chronic pain to her left foot wound. 11/9; patient presents for follow-up. She came in with only socks on. She states she forgot to put on shoes. It is unclear if she is doing any dressing changes.  She currently denies systemic signs of infection. 11/16; patient presents for follow-up. She came again only with socks on. She states she does not wear shoes ever. It is unclear if she does dressing changes. She currently denies systemic signs of infection. 11/23; patient presents for follow-up. She wore her shoes today. It still unclear exactly what  dressing she is using for each wound but she did states she obtained Dakin's solution and has been using this to the left foot wound. She currently denies signs of infection. 11/30; patient presents for follow-up. She has no issues or complaints today. She currently denies signs of infection. 12/7; patient presents for follow-up. She has no issues or complaints today. She has been using Hydrofera Blue to the right heel wound and Dakin solution to the left foot wound. Her right anterior leg wound is healed. She currently denies signs of infection. 12/14; patient presents for follow-up. She has been using Hydrofera Blue to the right heel and Dakin's to the left foot wounds. She has no issues or complaints today. She denies signs of infection. 12/21; patient presents for follow-up. She reports using Hydrofera Blue to the right heel and Dakin's to the left foot wound. She denies signs of infection. 12/28; patient presents for follow-up. She continues to use Dakin's to the left foot wound and Hydrofera Blue to the right heel wound. She denies signs of infection. 1/4; patient presents for follow-up. She has no issues or complaints today. She denies signs of infection. 1/11; patient presents for follow-up. It is unclear if she has been dressing these wounds over the past week. She currently denies signs of infection. 1/18; patient presents for follow-up. She states she has been using Dakin's wet-to-dry dressings to the left foot. She has been using Hydrofera Blue to the right foot foot wound. She states that the anterior right leg wound has reopened and draining serous fluid. She denies signs of infection. 1/25; patient presents for follow-up. She has no issues or complaints today. 2/1; patient presents for follow-up. She has no issues or complaints today. She denies signs of infection. 2/8; patient presents for follow-up. She has lost her surgical shoes. She did not have [name] dressing to the right heel wound.  She currently denies signs of infection. 2/15; patient presents for follow-up. She reports more pain to the right heel today. She denies purulent drainage Or fever/chills 2/22; patient presents for follow-up. She reports taking clindamycin over the past week. She states that she continues to have pain to her right heel. She reports purulent drainage. Readmission 03/16/2022 Ms. Maidah Shorb is [name] 47 year old female with [name] past medical history of type 2 diabetes, osteomyelitis to her feet, chronic systolic heart failure and bipolar disorder that presents to the clinic for bilateral feet wounds and right lower extremity wound. She was last seen in our clinic on 12/15/2021. At that time she had purulent drainage coming out of her right plantar foot and I recommended she go to the ED. She states she went to [facility] and has been there for the past 3 months. I cannot see the records. She states she had OR debridement and was on several weeks of IV antibiotics while inpatient. Since discharge she has not been taking care of the wound beds. She had nothing on her feet other than socks today. She currently denies signs of infection. 5/31; patient presents for follow-up. She has been using Dakin's wet-to-dry dressings to the wound beds on her feet bilaterally and antibiotic ointment  to the right anterior leg wound. She had [name] wound culture done at last clinic visit that showed moderate Pseudomonas aeruginosa sensitive to ciprofloxacin. She currently denies systemic signs of infection. 6/14; patient presents for follow-up. She received Keystone 5 days ago and has been using this on the wound beds. She states that last week she had to go to the hospital because she had increased warmth and erythema to the right foot. She was started on 2 oral antibiotics. She states she has been taking these. She currently denies systemic signs of infection. She has no issues or complaints today. 6/21; patient  presents for follow-up. She states she has been using Keystone antibiotics to the wound beds. She has no issues or complaints today. She denies signs of infection. 6/28; patient presents for follow-up. She has been using Keystone antibiotics to the wound beds. She has no issues or complaints today. 7/12; patient presents for follow-up. Has been using Keystone antibiotics to the wound beds with calcium alginate. She has no issues or complaints today. She never followed up with her orthopedic surgeon who did the OR debridement to the right foot. We discussed the total contact cast for the left foot and patient would like to do this next week. 7/19; patient presents for follow-up. She has been using Keystone antibiotics with calcium alginate to the wound beds. She has no issues or complaints today. Patient is in agreement to do the total contact cast of the left foot today. She knows to return later this week for the obligatory cast change. 05-13-2022 upon evaluation today patient's wound which she has the cast of the left leg actually appears to be doing significantly better. Fortunately I do not see any signs of active infection locally or systemically which is great news and overall I am extremely pleased with where we stand currently. 7/26; patient presents for follow-up. She has [name] cast in place for the past week. She states it irritated her shin. Other than that she tolerated the cast well. She states she would like [name] break for 1 week from the cast. We have been using Keystone antibiotic and Aquacel to both wound beds. She denies signs of infection. 8/2; patient presents for follow-up. She has been using Keystone and Aquacel to the wound beds. She denies any issues and has no complaints. She is agreeable to have the cast placed today for the left leg. 06-03-2022 upon evaluation today patient appears to be doing well with regard to her wound she saw some signs of improvement which is great  news. Fortunately I do not see any evidence of active infection locally or systemically at this time which is great news. No fevers, chills, nausea, vomiting, or diarrhea. 8/16; patient presents for follow-up. She has no issues or complaints today. We have been using Keystone and Aquacel to the wound beds. The left lower extremity is in [name] total contact cast. She is tolerated this well. 8/23; patient presents for follow-up. She has had the total contact cast on the left leg for the past week. Unfortunately this has rubbed and broken down the skin to the medial foot. She currently denies signs of infection. She has been using Keystone antibiotic to the right plantar foot wound. [name], [name] [name] ([id number]) [id number] Page 8 of 22 8/30; patient presents for follow-up. We have held off on the total contact cast for the left leg for the past week. Her wound on the left foot has improved and the previous surrounding breakdown of skin  has epithelialized. She has been using Keystone antibiotic to both wound beds. She has no issues or complaints today. She denies signs of infection. 9/6; patient presents for follow-up. She has ordered her's Keystone antibiotic and this is arriving this week. She has been doing Dakin's wet-to-dry dressings to the wound beds. She denies signs of infection. She is agreeable to the total contact cast today. 9/13; patient presents for follow-up. She states that the cast caused her left leg shin to be sore. She would like to take [name] break from the cast this week. She has been using Keystone antibiotic to the right plantar foot wound. She denies signs of infection. 9/20; patient presents for follow-up. She has been using Keystone antibiotics to the wound beds with calcium alginate to the right foot wound and Hydrofera Blue to the left foot wound. She is agreeable to having the cast placed today. She has been approved for Apligraf and we will order this  for next clinic visit. 9/27; patient presents for follow-up. We have been using Keystone antibiotic with Hydrofera Blue to the left foot wound under [name] total contact cast. T the right o foot wound she has been using Keystone antibiotic and calcium alginate. She declines [name] total contact cast today. Apligraf is available for placement and she would like to proceed with this. 07-28-2022 upon evaluation today patient appears to be doing well currently in regard to her wound. She is actually showing signs of significant improvement which is great news. Fortunately I do not see any evidence of active infection locally nor systemically at this time. She has been seeing Dr. Heber Seneca and to be honest has been doing very well with the cast. Subsequently she comes in today with [name] cast on and we did reapply that today as well. She did not really want to she try to talk me out of that but I explained that if she wanted to heal this is really the right way to go. Patient voiced understanding. In regard to her right foot this is actually [name] lot better compared to the last time I saw her which is also great news. 10/11; patient presents for follow-up. Apligraf and the total contact cast was placed to the left leg at last clinic visit. She states that her right foot wound had burning pain to it with the placement of Apligraf to this area. She has been doing [place] over this area. She denies signs of infection including increased warmth, erythema or purulent drainage. 11/1; 3-week follow-up. The patient fortunately did not have [name] total contact cast or an Apligraf and on the left foot. She has been using Keystone ABD pads and kerlix and her own running shoes She arrives in clinic today with thick callus and [name] very poor surface on the left foot on the right nonviable skin subcutaneous tissue and [name] deep probing hole. 11/15; patient missed her last clinic appointment. She states she has not been dressing the wound beds for  the past 2 weeks. She states that at she had [name] new roommate but is now going back to live with her mother. Apparently its been [name] distracting 2 weeks. Patient currently denies signs of infection. 11/22; patient presents for follow-up. She states she has been using Keystone antibiotic and Dakin's wet-to-dry dressings to the wound beds. She is agreeable for cast placement today. We had ordered Apligraf however this has not been received by our facility. 11/29; Patient had [name] total contact cast placed at last clinic visit and  she tolerated this well. We were using silver alginate under the cast. Patient's been using Keystone antibiotic with Aquacel to the right plantar foot wound. She has no issues or complaints today. Apligraf is available for placement today. Patient would like to proceed with this. 12/6; patient presents for follow-up. She had Apligraf placed in standard fashion last clinic visit under the total contact cast to the left lower extremity. She has been using Keystone antibiotic and Aquacel to the right plantar foot wound. She has no issues or complaints today. 12/13; patient presents for follow-up. She has finished 5 Apligraf placements. Was told she would not qualify for more. We have been doing [name] total contact cast to the left lower extremity. She has been using Keystone antibiotic and Aquacel to the right plantar foot wound. She has no issues or complaints today. 12/20; patient presents for follow-up. We have been using Hydrofera Blue with Keystone antibiotic under [name] total contact cast of the left lower extremity. She reports using Keystone antibiotic and silver alginate to the right heel wound. She has no issues or complaints today. 12/27; patient presents with [name] healthy wound on the left midfoot. We have Apligraf to apply that to that more also using [name] total contact cast. On the right we are using Keystone and silver alginate. She is offloading the right heel with [name] surgical  shoealthough by her admission she is on her feet quite [name] bit 1/3; patient presents for follow-up. Apligraf was placed to the wound beds last clinic visit. She was placed in [name] total contact cast to the left lower extremity. She declines [name] total contact cast today. She states that her mother is in the hospital and she cannot adequately get around with the cast on. 1/10; patient presents for follow-up. She declined the total contact cast at last clinic visit. Both wounds have declined in appearance. She states that she has been on her feet and not offloading the wound beds. She currently denies signs of infection. 1/17; patient presents for follow-up. She had the total contact cast along with Apligraf placed last week to the left lower extremity. She tolerated this well. She has been using Aquacel Ag and Keystone antibiotic to the right heel wound. She currently denies signs of infection. She has no issues or complaints today. 1/24; patient presents for follow-up. We have been using Apligraf to the left foot wound along with [name] total contact cast. She has done well with this. T the right o heel wound she has been using Aquacel Ag and Keystone antibiotic ointment. She has no issues or complaints today. She denies signs of infection. 1/31; patient presents for follow-up. We have been using Apligraf to the left foot wound along with the total contact cast. She continues to do well with this. To the right heel we have been using Aquacel Ag and Keystone antibiotic ointment. She has no issues or complaints today. 12-01-2022 upon evaluation patient is seen today on my schedule due to the fact that she unfortunately was in the hospital yesterday. Her cast needed to come off the only reason she is out of the hospital is due to the fact that they would not take it off at the hospital which is somewhat bewildered reading to me to be perfectly honest. I am not certain why this was but either way she was released and  then was placed on my schedule today in order to get this off and reapply the total contact casting as appropriate. I do  not have an Apligraf for her today it was applied last week and today's actually expired yesterday as there was some scheduling conflicts with her being in the hospital. Nonetheless we do not have that for reapplication today but the good news that she is not draining too much and the Apligraf can go for up to 2 weeks so I am going to go ahead and reapply the total contact casting but we are going to leave the Apligraf in place. 2/14; patient presents for follow-up. T the left leg she has had the total contact cast and Apligraf for the past week. She has had no issues with this. T the o o right heel she has been using antibiotic ointment and Aquacel Ag. 2/21; patient presents for follow-up. We have been using Apligraf and [name] total contact cast to the left lower extremity. She is tolerated this well. Unfortunately she is not approved for any more Apligraf per insurance. She has been using antibiotic ointment and Aquacel Ag to the right foot. She has no issues or complaints today. 2/28; patient presents for follow-up. We have been using Hydrofera Blue and antibiotic ointment under the total contact cast to the left lower extremity. He has been using Aquacel Ag with antibiotic ointment to the right plantar foot. She has no issues or complaints today. 3/6; patient presents for follow-up. She did not obtain her gentamicin ointment. She has been using Aquacel Ag to the right plantar foot wound. We have been using Hydrofera Blue with antibiotic ointment under the total contact cast to the left lower extremity. She has no issues or complaints today. Electronic Signature(s) Signed: 12/28/2022 1:18:38 PM By: Sherilyn Dacosta [name] ([id number]) PM By: Heather Shan DO [id number] Page 9 of 22 Signed: 12/28/2022 1:18:38 Entered By: [name] on  12/28/2022 09:29:51 -------------------------------------------------------------------------------- Physical Exam Details Patient Name: Date of Service: KAMEAH, THRESHER 12/28/2022 8:30 [name]: [id number] Patient Account Number: [id number] Date of Birth/Sex: Treating RN: [birth year] (47 y.o. [name] Primary Care Provider: Raelene [name] Other Clinician: Referring Provider: Treating Provider/Extender: [name] in Treatment: 70 Constitutional . Cardiovascular . Psychiatric . Notes Right foot: T the plantar heel there is an incision site with increased depth, No probing to bone. T the opening there is Nonviable tissue and granulation tissue. o o Left foot: T the medial aspect there is an open wound with granulation tissue, Callus and nonviable tissue. No overt signs of infection to any of the wound o beds. Electronic Signature(s) Signed: 12/28/2022 1:18:38 PM By: Heather Shan DO Entered By: [name] on 12/28/2022 09:30:16 -------------------------------------------------------------------------------- Physician Orders Details Patient Name: Date of Service: [name] 12/28/2022 8:30 [name]: [id number] Patient Account Number: [id number] Date of Birth/Sex: Treating RN: [birth year] (47 y.o. [name] Primary Care Provider: Raelene [name] Other Clinician: Referring Provider: Treating Provider/Extender: [name] in Treatment: 2 Verbal / Phone Orders: No Diagnosis Coding Follow-up Appointments Return Appointment in 1 week. Nurse Visit as needed Bathing/ Shower/ Hygiene May shower with wound dressing protected with water repellent cover or cast protector. FLORE, RAMIL [name] ([id number]) [id number] Page 10 of 22 No tub bath. Anesthetic (Use 'Patient Medications' Section for Anesthetic Order Entry) Lidocaine applied to wound  bed Edema Control - Lymphedema / Segmental Compressive Device / Other Elevate, Exercise Daily and [name] void Standing for Long Periods of Time. Elevate legs to the level of the heart and pump ankles  as often as possible Elevate leg(s) parallel to the floor when sitting. Off-Loading Total Contact Cast to Left Lower Extremity Open toe surgical shoe - Heel off loader - Right foot Other: - keep pressure off of feet. Additional Orders / Instructions Follow Nutritious Diet and Increase Protein Intake Other: - Stay off of foot, wheelchair only Medications-Please add to medication list. Wound #11 Right,Plantar Foot Keystone Compound - use at home ntibiotic - apply Gentamycin in office only Topical [name] Wound Treatment Wound #11 - Foot Wound Laterality: Plantar, Right Cleanser: Wound Cleanser 1 x Per Day/30 Days Discharge Instructions: Wash your hands with soap and water. Remove old dressing, discard into plastic bag and place into trash. Cleanse the wound with Wound Cleanser prior to applying [name] clean dressing using gauze sponges, not tissues or cotton balls. Do not scrub or use excessive force. Pat dry using gauze sponges, not tissue or cotton balls. Topical: Gentamicin 1 x Per Day/30 Days Discharge Instructions: Apply as directed by provider. in clinic Prim Dressing: Aquacel Extra Hydrofiber Dressing, 2x2 (in/in) ary 1 x Per Day/30 Days Secondary Dressing: ABD Pad 5x9 (in/in) (Generic) 1 x Per Day/30 Days Discharge Instructions: Cover with ABD pad Secured With: Medipore T - 78M Medipore H Soft Cloth Surgical T ape ape, 2x2 (in/yd) 1 x Per Day/30 Days Secured With: [employer] Sterile or Non-Sterile 6-ply 4.5x4 (yd/yd) 1 x Per Day/30 Days Discharge Instructions: Apply Kerlix as directed Wound #12 - Foot Wound Laterality: Plantar, Left, Medial Cleanser: Wound Cleanser 1 x Per Day/30 Days Discharge Instructions: Wash your hands with soap and water. Remove old dressing, discard into plastic bag and  place into trash. Cleanse the wound with Wound Cleanser prior to applying [name] clean dressing using gauze sponges, not tissues or cotton balls. Do not scrub or use excessive force. Pat dry using gauze sponges, not tissue or cotton balls. Topical: Gentamicin 1 x Per Day/30 Days Discharge Instructions: Apply as directed by provider. in clinic Prim Dressing: Hydrofera Blue Ready Transfer Foam, 2.5x2.5 (in/in) 1 x Per Day/30 Days ary Discharge Instructions: Apply Hydrofera Blue Ready to wound bed as directed Prim Dressing: Silvercel 4 1/4x 4 1/4 (in/in) 1 x Per Day/30 Days ary Discharge Instructions: Apply to periwound Secondary Dressing: ABD Pad 5x9 (in/in) (Generic) 1 x Per Day/30 Days Discharge Instructions: Cover with ABD pad Secured With: Medipore T - 78M Medipore H Soft Cloth Surgical T ape ape, 2x2 (in/yd) 1 x Per Day/30 Days Secured With: [employer] Sterile or Non-Sterile 6-ply 4.5x4 (yd/yd) 1 x Per Day/30 Days Discharge Instructions: Apply Kerlix as directed Electronic Signature(s) Signed: 12/28/2022 1:18:38 PM By: Heather Shan DO Signed: 12/28/2022 4:47:39 PM By: [name] Entered By: [name] on 12/28/2022 09:41:30 [name] ([id number]) [id number] Page 11 of 22 -------------------------------------------------------------------------------- Problem List Details Patient Name: Date of Service: BREONNE, TUTWILER 12/28/2022 8:30 [name]: [id number] Patient Account Number: [id number] Date of Birth/Sex: Treating RN: 09-12-1976 (47 y.o. 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/2022 No Yes severity L97.512  Non-pressure chronic ulcer of other part of right foot with fat layer exposed 03/16/2022 No Yes E11.621 Type 2 diabetes mellitus with foot ulcer 03/16/2022 No Yes E11.42 Type 2 diabetes mellitus with diabetic polyneuropathy 03/16/2022 No  Yes L97.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 03/16/2022 No Yes of skin Inactive Problems Resolved Problems Electronic Signature(s) Signed: 12/28/2022 1:18:38 PM By: Heather Shan DO Entered By: [name] on 12/28/2022 09:27:16 -------------------------------------------------------------------------------- Progress Note Details Patient Name: Date of Service: [name] 12/28/2022 8:30 [name]: [id number] Patient Account Number: [id number] [name], [name] ([id number]) [id number] Page 12 of 22 Date of Birth/Sex: Treating RN: Apr 26, 1976 (47 y.o. [name] Primary Care Provider: Other Clinician: Raelene [name] Referring Provider: Treating Provider/Extender: [name] in Treatment: 57 Subjective Chief Complaint Information obtained from Patient 03/19/2021; patient referred by Dr. Luana [name] who has been looking after her left foot for quite [name] period of time for review of [name] nonhealing area in the left midfoot 03/12/2022; bilateral feet wounds and right lower extremity wound. History of Present Illness (HPI) 01/18/18-She is here for initial evaluation of the left great toe ulcer. She is [name] poor historian in regards to timeframe in detail. She states approximately 4 weeks ago she lacerated her toe on something in the house. She followed up with her primary care who placed her on Bactrim and ultimately [name] second dose of Bactrim prior to coming to wound clinic. She states she has been treating the toe with peroxide, Betadine and [name] Band-Aid. She did not check her blood sugar this morning but checked it yesterday morning it was 327; she  is unaware of [name] recent A1c and there are no current records. She saw Dr. she would've orthopedics last week for an old injury to the left ankle, she states he did not see her toe, nor did she bring it to his attention. She smokes approximately 1 pack cigarettes [name] day. Her social situation is concerning, she arrives this morning with her mother who appears extremely intoxicated/under the influence; her mother was asked to leave the room and be monitored by the patient's grandmother. The patient's aunt then accompanied the patient and the room throughout the rest of the appointment. We had [name] lengthy discussion regarding the deleterious effects of uncontrolled hyperglycemia and smoking as it relates to wound healing and overall health. She was strongly encouraged to decrease her smoking and get her diabetes under better control. She states she is currently on [name] diet and has cut down her [facility] consumption. The left toe is erythematous, macerated and slightly edematous with malodor present. The edema in her left foot is below her baseline, there is no erythema streaking. We will treat her with Santyl, doxycycline; we have ordered and xray, culture and provided [name] Peg assist surgical shoe and cultured the wound. 01/25/18-She is here in follow-up evaluation for [name] left great toe ulcer and presents with an abscess to her suprapubic area. She states her blood sugars remain elevated, feeling "sick" and if levels are below 250, but she is trying. She has made no attempt to decrease her smoking stating that we "can't take away her food in her cigarettes". She has been compliant with offloading using the PEG assist you. She is using Santyl daily. the culture obtained last week grew staph aureus and Enterococcus faecalis; continues on the doxycycline and Augmentin was added on Monday. The suprapubic area has erythema, no femoral variation, purple discoloration, minimal induration, was accessed with [name] cotton tip  applicator with sanguinopurulent drainage, this was cultured, I suspect the current antibiotic treatment will cover and we will not add anything to her current treatment plan.  She was advised to go to urgent care or ER with any change in redness, induration or fever. 02/01/18-She is here in follow-up evaluation for left great toe ulcers and [name] new abdominal abscess from last week. She was able to use packing until earlier this week, where she "forgot it was there". She states she was feeling ill with GI symptoms last week and was not taking her antibiotic. She states her glucose levels have been predominantly less than 200, with occasional levels between 200-250. She thinks this was contributing to her GI symptoms as they have resolved without intervention. There continues to be significant laceration to left toe, otherwise it clinically looks stable/improved. There is now less superficial opening to the lateral aspect of the great toe that was residual blister. We will transition to [facility] to all wounds, she will continue her Augmentin. If there is no change or deterioration next week for reculture. 02/08/18-She is here in follow-up evaluation for left great toe ulcer and abdominal ulcer. There is an improvement in both wounds. She has been wrapping her left toe with coban, not by our direction, which has created an area of discoloration to the medial aspect; she has been advised to NOT use coban secondary to her neuropathy. She states her glucose levels have been high over this last week ranging from 200-350, she continues to smoke. She admits to being less compliant with her offloading shoe. We will continue with same treatment plan and she will follow-up next week. 02/15/18-She is here in follow-up evaluation for left great toe ulcer and abdominal ulcer. The abdominal ulcer is epithelialized. The left great toe ulcer is improved and all injury from last week using the Coban wrap is resolved,  the lateral ulcer is healed. She admits to noncompliance with wearing offloading shoe and admits to glucose levels being greater than 300 most of the week. She continues to smoke and expresses no desire to quit. There is one area medially that probes deeper than it has historically, erythema to the toe and dorsal foot has consistently waxed and waned. There is no overt signs of cellulitis or infection but we will culture the wound for any occult infection given the new area of depth and erythema. We will hold off on sensitivities for initiation of antibiotic therapy. 02/22/18-She is here in follow up evaluation for left great toe ulcer. There is overall significant improvement in both wound appearance, erythema and edema with changes made last week. She was not initiated on antibiotic therapy. Culture obtained last week showed oxacillin sensitive staph aureus, sensitive to clindamycin. Clindamycin has been called into the pharmacy but she has been instructed to hold off on initiation secondary to overall clinical improvement and her history of antibiotic intolerance. She has been instructed to contact the clinic with any noted changes/deterioration and the wound, erythema, edema and/or pain. She will follow-up next week. She continues to smoke and her glucose levels remain elevated >250; she admits to compliance with offloading shoe 03/01/18 on evaluation today patient appears to be doing fairly well in regard to her left first toe ulcer. She has been tolerating the dressing changes with the [facility] Dressing without complication and overall this has definitely showed signs of improvement according to records as well is what the patient tells me today. I'm very pleased in that regard. She is having no pain today 03/08/18 She is here for follow up evaluation of [name] left great toe ulcer. She remains non-compliant with glucose control and smoking  cessation; glucose levels consistently >200. She states that  she got new shoe inserts/peg assist. She admits to compliance with offloading. Since my last evaluation there is significant improvement. We will switch to prisma at this time and she will follow up next week. She is noted to be tachycardic at this appointment, heart rate 120s; she has [name] history of heart rate 70-130 according to our records. She admits to extreme agitation r/t personal issues; she was advised to monitor her heartrate and contact her physician if it does not return to [name] more normal range (<100). She takes cardizem twice daily. 03/15/18-She is here in follow-up evaluation for left great toe ulcer. She remains noncompliant with glucose control and smoking cessation. She admits to compliance with wearing offloading shoe. The ulcer is improved/stable and we will continue with the same treatment plan and she will follow-up next week 03/22/18-She is here for evaluation for left great toe ulcer. There continues to be significant improvement despite recurrent hyperglycemia (over 500 yesterday) and she continues to smoke. She has been compliant with offloading and we will continue with same treatment plan and she will follow-up next week. 03/29/18-She is here for evaluation for left great toe ulcer. Despite continuing to smoke and uncontrolled diabetes she continues to improve. She is compliant with offloading shoe. We will continue with the same treatment plan and she will follow-up next week 04/05/18- She is here in follow up evaluation for [name] left great toe ulcer; she presents with small pustule to left fifth toe (resembles ant bite). She admits to compliance with wearing offloading shoe; continues to smoke or have uncontrolled blood glucose control. There is more callus than usual with evidence of bleeding; she denies known trauma. 04/12/18-She is here for evaluation of left great toe ulcer. Despite noncompliance with glycemic control and smoking she continues to make improvement. She continues to  wear offloading shoe. The pustule, that was identified last week, to the left fifth toe is resolved. She will follow-up in 2 weeks 05/03/18-she is seen in follow-up evaluation for [name] left great toe ulcer. She is compliant with offloading, otherwise noncompliant with glycemic control and smoking. She has plateaued and there is minimal improvement noted. We will transition to [facility], replaced the insert to her surgical shoe and she will follow-up in one week 05/10/18- She is here in follow up evaluation for [name] left great toe ulcer. It appears stable despite measurement change. We will continue with same treatment plan and follow up next week. 05/24/18-She is seen in follow-up evaluation for [name] left great toe ulcer. She remains compliant with offloading, has made significant improvement in her diet, decreasing the amount of sugar/soda. She said her recent A1c was 10.9 which is lower than. She did see [name] diabetic nutritionist/educator yesterday. She continues to smoke. We will continue with the same treatment plan and she'll follow-up next week. 05/31/18- She is seen in follow-up evaluation for left great toe ulcer. She continues to remain compliant with offloading, continues to make improvement in her diet, increasing her water and decreasing the amount of sugar/soda. She does continue to smoke with no desire to quit. We will apply Prisma to the depth and Hydrofera Blue over. We have not received insurance authorization for oasis. She will follow up next week. 06/07/18-She is seen in follow-up evaluation for left great toe ulcer. It has stalled according to today's measurements although base appears stable. She says she saw [name] diabetic educator yesterday; her average blood sugars are less than  300 which is an improvement for her. She continues to smoke and states "that's my next step" She continues with water over soda. We will order for xray, culture and reinstate ace wrap compression prior to placing  apligraf for next week. She is voicing no complaints or concerns. Her dressing will change to iodoflex over the next week in preparation for apligraf. 06/14/18-She is seen in follow-up evaluation for left great toe ulcer. Plain film x-ray performed last week was negative for osteomyelitis. Wound culture obtained JAMONI, KENNAN [name] ([id number]) [id number] Page 13 of 22 last week grew strep B and OSSA; she is initiated on keflex and cefdinir today; there is erythema to the toe which could be from ace wrap compression, she has [name] history of wrapping too tight and has has been encouraged to maintain ace wraps that we place today. We will hold off on application of apligraf today, will apply next week after antibiotic therapy has been initiated. She admits today that she has resumed taking [name] shower with her foot/toe submerged in water, she has been reminded to keep foot/toe out of the bath water. She will be seen in follow up next week 06/21/18-she is seen in follow-up evaluation for left great toe ulcer. She is tolerating antibiotic therapy with no GI disturbance. The wound is stable. Apligraf was applied today. She has been decreasing her smoking, only had 4 cigarettes yesterday and 1 today. She continues being more compliant in diabetic diet. She will follow-up next week for evaluation of site, if stable will remove at 2 weeks. 06/28/18- She is here in follow up evalution. Apligraf was placed last week, she states the dressing fell off on Tuesday and she was dressing with hydrofera blue. She is healed and will be discharged from the clinic today. She has been instructed to continue with smoking cessation, continue monitoring glucose levels, offloading for an additional 4 weeks and continue with hydrofera blue for additional two weeks for any possible microscopic opening. Readmission: 08/07/18 on evaluation today patient presents for reevaluation concerning the ulcer of her right  great toe. She was previously discharged on 06/28/18 healed. Nonetheless she states that this began to show signs of drainage she subsequently went to her primary care provider. Subsequently an x-ray was performed on 08/01/18 which was negative. The patient was also placed on antibiotics at that time. Fortunately they should have been effective for the infection. Nonetheless she's been experiencing some improvement but still has [name] lot of drainage coming from the wound itself. 08/14/18 on evaluation today patient's wound actually does show signs of improvement in regard to the erythema at this point. She has completed the antibiotics. With that being said we did discuss the possibility of placing her in [name] total contact cast as of today although I think that I may want to give this just [name] little bit more time to ensure nothing recurrence as far as her infection is concerned. I do not want to put in the cast and risk infection at that time if things are not completely resolved. With that being said she is gonna require some debridement today. 08/21/18 on evaluation today patient actually appears to be doing okay in regard to her toe ulcer. She's been tolerating the dressing changes without complication. With that being said it does appear that she is ready and in fact I think it's appropriate for [place] to go ahead and initiate the total contact cast today. Nonetheless she will require some sharp debridement to prepare the wound  for application. Overall I feel like things have been progressing well but we do need to do something to get this to close more readily. 08/24/18 patient seen today for reevaluation after having had the total contact cast applied on Tuesday. She seems to have done very well the wound appears to be doing great and overall I'm pleased with the progress that she's made. There were no abnormal areas of rubbing from the cast on her lower extremity. 08/30/18 on evaluation today patient actually  appears to be completely healed in regard to her plantar toe ulcer. She tells me at this point she's been having [name] lot of issues with the cast. She almost fell [name] couple of times the state shall the step of her dog [name] couple times as well. This is been [name] very frustrating process for her other nonetheless she has completely healed the wound which is excellent news. Overall there does not appear to be the evidence of infection at this time which is great news. 09/11/18 evaluation today patient presents for follow-up concerning her great toe ulcer on the left which has unfortunately reopened since I last saw her which was only [name] couple of weeks ago. Unfortunately she was not able to get in to get the shoe and potentially the AFO that's gonna be necessary due to her left foot drop. She continues with offloading shoe but this is not enough to prevent her from reopening it appears. When we last had her in the total contact cast she did well from [name] healing standpoint but unfortunately the wound reopened as soon as she came out of the cast within just [name] couple of weeks. 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 This is also part of the reason why she falls on [name] regular basis. I just do not believe that is gonna be safe for [place] to reinitiate the total contact cast as last time we had this on she fell 3 times one week which is definitely not normal for her. 09/18/18 upon evaluation today the patient actually appears to be doing about the same in regard to her toe ulcer. She did not contact [employer] as I asked her to even though I had given her the prescription. In fact she actually states that she has no idea where the prescription is. She did apparently call [employer] and they told her that all she needed to do was bring  the prescription in order to be able to be seen and work on getting the AFO for her left foot. With all that being said she still does not have an appointment and I'm not sure were things stand that regard. I will give her [name] new prescription today in order to contact them to get this set up. 09/25/18 on evaluation today patient actually appears to be doing about the same in regard to her toes ulcer. She does have [name] small areas which seems to have [name] lot of callous buildup around the edge of the wound which is going to need sharp debridement today. She still is waiting to be scheduled for evaluation with [employer] for possibility of an AFO. She states there supposed to call her tomorrow to get this set up. Unfortunately it does appear that her foot specifically the toe area  is showing signs of erythema. There does not appear to be any systemic infection which is in these good news. 10/02/18 on evaluation today patient actually appears to be doing about the same in regard to her toe ulcer. This really has not done too well although it's not significantly larger it's also not significantly smaller. She has been tolerating the dressing changes without complication. She actually has her appointment with [facility] and [place] tomorrow to hopefully be measured for obtaining and AFO splint. I think this would be helpful preventing this from reoccurring. We had contemplated starting the cast this week although to be honest I am reluctant to do that as she's been having nausea, vomiting, and seizure activity over the past three days. She has [name] history of seizures and have been told is nothing that can be done for these. With that being said I do believe that along with the seizures have the nausea vomiting which upon further questioning doesn't seem to be the normal for her and makes me concerned for the possibility of infection or something else going on. I discussed this with the patient and her mother during the  office visit today. I do not feel the wound is effective but maybe something else. The responses this was "this just happens to her at times and we don't know why". They did not seem to be interested in going to the hospital to have this checked out further. 10/09/18 on evaluation today patient presents for follow-up concerning her ongoing toe ulcer. She has been tolerating the dressing changes without complication. Fortunately there does not appear to be any evidence of infection which is great news however I do think that the patient would benefit from going ahead for with the total contact cast. She's actually in [name] wheelchair today she tells me that she will use her walker if we initiate the cast. I was very specific about the fact that if we were gonna do the cast I wanted to make sure that she was using the walker in order to prevent any falls. She tells me she does not have stairs that she has to traverse on [name] regular basis at her home. She has not had any seizures since last week again that something that happens to her often she tells me she did talk to [employer] and they said that it may take up to three weeks to get the brace approved for her. Hopefully that will not take that long but nonetheless in the meantime I do think the cast could be of benefit. 10/12/18 on evaluation today patient appears to be doing rather well in regard to her toe ulcer. It's just been [name] few days and already this is significantly improved both as far as overall appearance and size. Fortunately there's no sign of infection. She is here for her first obligatory cast change. 10/19/18 Seen today for follow up and management of left great toe ulcer. Wound continues to show improvement. Noted small open area with seroussang drainage with palpation. Denies any increased pain or recent fevers during visit. She will continue calcium alginate with offloading shoe. Denies any questions or concerns during visit. 10/26/18 on  evaluation today patient appears to be doing about the same as when I last saw her in regard to her wound bed. Fortunately there does not appear to be any signs of infection. Unfortunately she continues to have [name] breakdown in regard to the toe region any time that she is not in the cast. It  takes almost no time at all for this to happen. Nonetheless she still has not heard anything from the brace being made by [employer] as to when exactly this will be available to her. Fortunately there is no signs of infection at this time. 10/30/18 on evaluation today patient presents for application of the total contact cast as we just received him this morning. Fortunately we are gonna be able to apply this to her today which is great news. She continues to have no significant pain which is good news. Overall I do feel like things have been improving while she was the cast is when she doesn't have [name] cast that things get worse. She still has not really heard anything from [facility] regarding her brace. 11/02/18 upon evaluation today patient's wound already appears to be doing significantly better which is good news. Fortunately there does not appear to be any signs of infection also good news. Overall I do think the total contact cast as before is helping to heal this area unfortunately it's just not gonna likely keep the JOVANI, ENTWISTLE [name] ([id number]) [id number] Page 14 of 22 area closed and healed without her getting her brace at least. Again the foot drop is [name] significant issue for her. 11/09/18 on evaluation today patient appears to be doing excellent in regard to her toe ulcer which in fact is completely healed. Fortunately we finally got the situation squared away with the paperwork which was needed to proceed with getting her brace approved by Medicaid. I have filled that out unfortunately that information has been sent to the orthopedic office that I worked at 2 1/2 years ago and not tired  Current wound care measures. Fortunately she seems to be doing very well at this time. 11/23/18 on evaluation today patient appears to be doing More Poorly Compared to Last Time I Saw Her. At [facility] She Had Completely Healed. Currently she is continuing to have issues with reopening. She states that she just found out that the brace was approved through Medicaid now she just has to go get measured in order to have this fitted for her and then made. Subsequently she does not have an appointment for this yet that is going to complicate things we obviously cannot put her back in the cast if we do not have everything measured because they're not gonna be able to measure her foot while she is in the cast. 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 Obviously this is unfortunate and does have me somewhat worried as well. 11/30/18 on evaluation today patient's toe ulcer actually appears to be doing fairly well. The good news is she will be getting her brace in the shoes next week on Wednesday. Hopefully we will be able to get this to heal without having to go back in the cast however she may need the cast in order to get the wound completely heal and then go from there. Fortunately there's no signs of infection at this time. 12/07/18 on evaluation today patient fortunately did receive her brace and she states she could tell this definitely makes her walk better. With that being said she's been having issues with her toe where she noticed yesterday there was [name] lot of tissue that was loosing off this appears to be much larger than what it was previous. She also states that her leg has been read putting much  across the top of her foot just about the ankle although this seems to be receiving somewhat. The total area is still red and appears to be someone infected as best I can tell. She is previously taken Bactrim  and that may be [name] good option for her today as well. We are gonna see what I wound culture shows as well and I think that this is definitely appropriate. With that being said outside of the culture I still need to initiate something in the interim and that's what I'm gonna go ahead and select Bactrim is [name] good option for her. 12/14/18 on evaluation today patient appears to be doing better in regard to her left great toe ulcer as compared to last week's evaluation. There's still some erythema although this is significantly improved which is excellent news. Overall I do believe that she is making good progress is still gonna take some time before she is where I would like her to be from the standpoint of being able to place her back into the total contact cast. Hopefully we will be where we need to be by next week. 12/21/18 on evaluation today patient actually appears to be doing poorly in regard to her toe ulcer. She's been tolerating the dressing changes without complication. Fortunately there's no signs of systemic infection although she does have [name] lot of drainage from the toe ulcer and this does seem to be causing some issues at this point. She does have erythema on the distal portion of her toe that appears to be likely cellulitis. 12/28/18 on evaluation today patient actually appears to be doing [name] little better in my pinion in regard to her toe ulcer. With that being said she still does have some evidence of infection at this time and for her culture she had both E. coli as well as enterococcus as organisms noted on evaluation. For that reason I think that though the Keflex likely has treated the E. coli rather well this has really done nothing for the enterococcus. We are going to have to initiate treatment for this specifically. 01/04/19 on evaluation today patient's toe actually appears to be doing better from the standpoint of infection. She currently would like to see about putting the cash back  on I think that this is appropriate as long as she takes care of it and keeps it from getting wet. She is gonna have some drainage we can definitely pass this up with Drawtex and alginate to try to prevent as much drainage as possible from causing the problems. With that being said I do want to at least try her with the cast between now and Tuesday. If there any issues we can't continue to use it then I will discontinue the use of the cast at that point. 01/08/19 on evaluation today patient actually appears to be doing very well as far as her foot ulcer specifically the great toe on the left is concerned. She did have an area of rubbing on the medial aspect of her left ankle which again is from the cast. Fortunately there's no signs of infection at this point in this appears to be [name] very slight skin breakdown. The patient tells me she felt it rubbing but didn't think it was that bad. Fortunately there is no signs of active infection at this time which is good news. No fevers, chills, nausea, or vomiting noted at this time. 01/15/19 on evaluation today patient actually appears to be doing well in regard to her  toe ulcer. Again as previous she seems to do well and she has the cast on which indicates to me that during the time she doesn't have [name] cast on she's putting way too much pressure on this region. Obviously I think that's gonna be an issue as with the current national emergency concerning the Covid-19 Virus it has been recommended that we discontinue the use of total contact casting by the chief medical officer of our company, Dr. Simona Huh. The reasoning is that if [name] patient becomes sick and cannot come into have the cast removed they could not just leave this on for an additional two weeks. Obviously the hospitals also do not want to receive patient's who are sick into the emergency department to potentially contaminate the region and spread the Covid-19 Virus among other sick individuals within the  hospital system. Therefore at this point we are suspending the use of total contact cast until the current emergency subsides. This was all discussed with the patient today as well. 01/22/19 on evaluation today patient's wound on her left great toe appears to be doing slightly worse than previously noted last week. She tells me that she has been on this quite [name] bit in fact she tells me she's been awake for 38 straight hours. This is due to the fact that she's having to care for grandparents because nobody else will. She has been taking care of them for five the last seven days since I've seen her they both have dementia his is from [name] stroke and her grandmother's was progressive. Nonetheless she states even her mom who knows her condition and situation has only help two of those days to take care of them she's been taking care of the rest. Fortunately there does not appear to be any signs of active infection in regard to her toe at this point although obviously it doesn't look as good as it did previous. I think this is directly related to her not taking off the pressure and friction by way of taking things easy. Though I completely understand what's going on. 01/29/19 on evaluation today patient's tools are actually appears to be showing some signs of improvement today compared to last week's evaluation as far as not necessarily the overall size of the wound but the fact that she has some new skin growth in between the two ends of the wound opening. Overall I feel like she has done well she states that she had [name] family member give her what sounds to be [name] CAM walker boot which has been helpful as well. 02/05/19 on evaluation today patient's wound bed actually appears to be doing significantly better in regard to her overall appearance of the size of the wound. With that being said she is still having an issue with offloading efficiently enough to get this to close. Apparently there is some signs of infection  at this point as well unfortunately. Previously she's done well of Augmentin I really do not see anything that needs to be culture currently but there theme and cellulitis of the foot that I'm seeing I'm gonna go ahead and place her on an antibiotic today to try to help clear this up. 02/12/2019 on evaluation today patient actually appears to be doing poorly in regard to her overall wound status. She tells me she has been using her offloading shoe but actually comes in today wearing her tennis shoe with the AFO brace. Again as I previously discussed with her this is really not sufficient  to allow the area to heal appropriately. Nonetheless she continues to be somewhat noncompliant and I do wonder based on what she has told my nurse in the past as to whether or not [name] good portion of this noncompliance may be recreational drug and alcohol related. She has had [name] history of heroin overdose and this was fairly recently in the past couple of months that have been seeing her. Nonetheless overall I feel like her wound looks significantly worse today compared to what it was previous. She still has significant erythema despite the Augmentin I am not sure that this is an appropriate medication for her infection I am also concerned that the infection may have gone down into her bone. 02/19/19 on evaluation today patient actually appears to be doing about the same in regard to her toe ulcer. Unfortunately she continues to show signs of bone exposure and infection at this point. There does not appear to be any evidence of worsening of the infection but I'm also not really sure that it's getting significantly better. She is on the Augmentin which should be sufficient for the Staphylococcus aureus infection that she has at this point. With that being said she may need IV antibiotics to more appropriately treat this. We did have [name] discussion today about hyperbaric option therapy. 02/28/19 on evaluation today patient  actually appears to be doing much worse in regard to the wound on her left great toe as compared to even my previous evaluation last week. Unfortunately this seems to be training in [name] pretty poor direction. Her toe was actually now starting to angle laterally and I can actually see the entire joint area of the proximal portion of the digit where is the distal portion of the digit again is no longer even in contact with the joint line. Unfortunately there's [name] lot more necrotic tissue around the edge and the toe appears to be showing signs of becoming gangrenous in my pinion. I'm very concerned about were things stand at this point. She did see infectious disease and they are planning to send in [name] prescription for Sivextro for her and [name], [name] ([id number]) [id number] Page 15 of 22 apparently this has been approved. With that being said I don't think she should avoid taking this but at the same time I'm not sure that it's gonna be sufficient to save her toe at this point. She tells me that she still having to care for grandparents which I think is putting quite [name] bit of strain on her foot and specifically the total area and has caused this to break down even to [name] greater degree than would've otherwise been expected. 03/05/19 on evaluation today patient actually appears to be doing quite well in regard to her toe all things considering. She still has bone exposed but there appears to be much less your thing on overall the appearance of the wound and the toe itself is dramatically improved. She still does have some issues currently obviously with infection she did see vascular as well and there concerned that her blood flow to the toad. For that reason they are setting up for an angiogram next week. 03/14/19 on evaluation today patient appears to be doing very poor in regard to her toe and specifically in regard to the ulceration and the fact that she's starting to notice  the toe was leaning even more towards the lateral aspect and the complete joint is visible on the proximal aspect of the joint. Nonetheless she's also  noted [name] significant odor and the tip of the toe is turning more dark and necrotic appearing. Overall I think she is getting worse not better as far as this is concerned. For that reason I am recommending at this point that she likely needs to be seen for likely amputation. READMISSION 03/19/2021 This is [name] patient that we cared for in this clinic for [name] prolonged period of time in 2019 and 2020 with [name] left foot and left first toe wound. 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 Luana [name]. In December 2021 she had an ulcer on her right great toe as well as the fourth and fifth toes. 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 She also had an angiogram at that time and underwent angioplasty of the right anterior tibial artery. In any case she claims that the wound on the right foot is closed I did not look at this today which was probably an oversight although I think that should be done next week. 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 According to Dr. Deborra Medina last review that she was out of the area being cared for by another physician but recently came back to his attention. 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 [name] culture of this area showed E. coli apparently before she came back to see Dr. Luana [name] she was supposed to be receiving antibiotics but she did not really take them. Nor is she offloading this area at all. Finally her last hemoglobin A1c listed in epic was in March 2022 at 14.1 she says things are [name] lot better since then although I am not sure. 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 She had this wound at the time. During this admission she had E. coli sepsis [name] TEE was negative. 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 She is still [name] heavy cigarette smoker. She has [name] history of bipolar disorder seizure disorder ABI in our clinic was 1.05 6/1; left midfoot in the setting of [name] TMA done previously. Round circular wound with [name] "knuckle" of protruding tissue. The problem is that the knuckle was not attached to any of the surrounding granulation and this probed proximally widely I removed [name] large portion of this tissue. This wound goes with considerable undermining laterally. I do not feel any bone there was no purulence but this is [name] deep wound. 6/8; in spite of the debridement I did last week. She arrives with [name] wound looking exactly the same. [name] protruding "knuckle" of tissue nonadherent to most of the surrounding tissue. There is considerable depth around this from 6-12 o'clock at 2.7 cm and undermining of 1 cm. This does not look overtly infected and the x- ray I did last week was negative for any osseous abnormalities. We have been using silver collagen 6/15; deep tissue culture I did last week showed moderate staph aureus and moderate Pseudomonas. This will definitely require prolonged antibiotic therapy. The pathology on the protuberant area was negative for malignancy fungus etc. the comment  was chronic ulceration with exuberant fibrin necrotic debris and negative for malignancy. We have been using silver collagen. I am going to be prescribing Levaquin for 2 weeks. Her CT scan of the foot is down for 7/5 6/22; CT scan of the foot on 7 5. She says she has hardware in the left leg from  her previous fracture. She is on the Levaquin for the deep tissue culture I did that showed methicillin sensitive staph aureus and Pseudomonas. I gave her [name] 2-week supply and she will have another week. She arrives in clinic today with the same protuberant tissue however this is nonadherent to the tissue surrounding it. I am really at [name] loss to explain this unless there is underlying deep tissue infection 6/29; patient presents for 1 week follow-up. She has been using collagen to the wound bed. She reports taking her antibiotics as prescribed.She has no complaints or issues today. She denies signs of infection. 7/6; patient presents for one week followup. She has been using collagen to the wound bed. She states she is taking Levaquin however at times she is not able to keep it down. She denies signs of infection. 7/13; patient presents for 1 week follow-up. She has been using silver alginate to the wound bed. She still has nausea when taking her antibiotics. She denies signs of infection. 7/20; patient presents for 1 week follow-up. She has been using silver alginate with gentamicin cream to the wound bed. She denies any issues and has no complaints today. She denies signs of infection. 7/27; patient presents for 1 week follow-up. She continues to use silver alginate with gentamicin cream to the wound bed. She reports starting her antibiotics. She has no issues or complaints. Overall she reports stability to the wound. 8/3; patient presents for 1 week follow-up. She has been using silver alginate with gentamicin cream to the wound bed. She reports completing all antibiotics. She has no issues or complaints today. She denies signs of infection. 8/17; patient presents for 2-week follow-up. He is to use silver alginate to the wound bed. She has no issues or complaints today. She denies signs of infection. She reports her pain has improved in her foot since last clinic visit 8/24; patient presents for  1 week follow-up. She continues to use silver alginate to the wound bed. She has no issues or complaints. She denies signs of infection. Pain is stable. 9/7; patient presents for follow-up. She missed her last week appointment due to feeling ill. She continues to use silver alginate. She has [name] new wound to the right lower extremity that is covered in eschar. She states It occurred over the past week and has no idea how it started. She currently denies signs of infection. 9/14; patient presents for follow-up. T the left foot wound she has been using gentamicin cream and silver alginate. T the right lower extremity wound she has o o been keeping this covered and has not obtain Santyl. 9/21; patient presents for follow-up. She reports using gentamicin cream and silver alginate to the left foot and Santyl to the right lower extremity wound. She has no issues or complaints today. She denies signs of infection. 9/28; patient presents for follow-up. She reports [name] new wound to her right heel. She states this occurred [name] few days ago and is progressively gotten worse. She has been trying to clean the area with [name] Q-tip and Santyl. She reports stability in the other 2 wounds. She has been using gentamicin cream and silver  alginate to the left foot and Santyl to the right lower extremity wound. 10/12; patient presents for follow-up. She reports improvement to the wound beds. She is seeing vein and vascular to discuss the potential of [name] left BKA. She states they are going to do an arteriogram. She continues to use silver alginate with dressing changes to her wounds. 11/2; patient presents for follow-up. She states she has not been doing dressing changes to the wound beds. She states she is not able to offload the areas. She reports chronic pain to her left foot wound. 11/9; patient presents for follow-up. She came in with only socks on. She states she forgot to put on shoes. It is unclear if she is doing any  dressing changes. She currently denies systemic signs of infection. 11/16; patient presents for follow-up. She came again only with socks on. She states she does not wear shoes ever. It is unclear if she does dressing changes. She currently denies systemic signs of infection. 11/23; patient presents for follow-up. She wore her shoes today. It still unclear exactly what dressing she is using for each wound but she did states she obtained Dakin's solution and has been using this to the left foot wound. She currently denies signs of infection. CHANE, GERRITS [name] ([id number]) [id number] Page 16 of 22 11/30; patient presents for follow-up. She has no issues or complaints today. She currently denies signs of infection. 12/7; patient presents for follow-up. She has no issues or complaints today. She has been using Hydrofera Blue to the right heel wound and Dakin solution to the left foot wound. Her right anterior leg wound is healed. She currently denies signs of infection. 12/14; patient presents for follow-up. She has been using Hydrofera Blue to the right heel and Dakin's to the left foot wounds. She has no issues or complaints today. She denies signs of infection. 12/21; patient presents for follow-up. She reports using Hydrofera Blue to the right heel and Dakin's to the left foot wound. She denies signs of infection. 12/28; patient presents for follow-up. She continues to use Dakin's to the left foot wound and Hydrofera Blue to the right heel wound. She denies signs of infection. 1/4; patient presents for follow-up. She has no issues or complaints today. She denies signs of infection. 1/11; patient presents for follow-up. It is unclear if she has been dressing these wounds over the past week. She currently denies signs of infection. 1/18; patient presents for follow-up. She states she has been using Dakin's wet-to-dry dressings to the left foot. She has been using Hydrofera  Blue to the right foot foot wound. She states that the anterior right leg wound has reopened and draining serous fluid. She denies signs of infection. 1/25; patient presents for follow-up. She has no issues or complaints today. 2/1; patient presents for follow-up. She has no issues or complaints today. She denies signs of infection. 2/8; patient presents for follow-up. She has lost her surgical shoes. She did not have [name] dressing to the right heel wound. She currently denies signs of infection. 2/15; patient presents for follow-up. She reports more pain to the right heel today. She denies purulent drainage Or fever/chills 2/22; patient presents for follow-up. She reports taking clindamycin over the past week. She states that she continues to have pain to her right heel. She reports purulent drainage. Readmission 03/16/2022 Ms. Immaculate Basilio is [name] 47 year old female with [name] past medical history of type 2 diabetes, osteomyelitis to her feet, chronic systolic heart failure and  bipolar disorder that presents to the clinic for bilateral feet wounds and right lower extremity wound. She was last seen in our clinic on 12/15/2021. At that time she had purulent drainage coming out of her right plantar foot and I recommended she go to the ED. She states she went to [facility] and has been there for the past 3 months. I cannot see the records. She states she had OR debridement and was on several weeks of IV antibiotics while inpatient. Since discharge she has not been taking care of the wound beds. She had nothing on her feet other than socks today. She currently denies signs of infection. 5/31; patient presents for follow-up. She has been using Dakin's wet-to-dry dressings to the wound beds on her feet bilaterally and antibiotic ointment to the right anterior leg wound. She had [name] wound culture done at last clinic visit that showed moderate Pseudomonas aeruginosa sensitive to ciprofloxacin.  She currently denies systemic signs of infection. 6/14; patient presents for follow-up. She received Keystone 5 days ago and has been using this on the wound beds. She states that last week she had to go to the hospital because she had increased warmth and erythema to the right foot. She was started on 2 oral antibiotics. She states she has been taking these. She currently denies systemic signs of infection. She has no issues or complaints today. 6/21; patient presents for follow-up. She states she has been using Keystone antibiotics to the wound beds. She has no issues or complaints today. She denies signs of infection. 6/28; patient presents for follow-up. She has been using Keystone antibiotics to the wound beds. She has no issues or complaints today. 7/12; patient presents for follow-up. Has been using Keystone antibiotics to the wound beds with calcium alginate. She has no issues or complaints today. She never followed up with her orthopedic surgeon who did the OR debridement to the right foot. We discussed the total contact cast for the left foot and patient would like to do this next week. 7/19; patient presents for follow-up. She has been using Keystone antibiotics with calcium alginate to the wound beds. She has no issues or complaints today. Patient is in agreement to do the total contact cast of the left foot today. She knows to return later this week for the obligatory cast change. 05-13-2022 upon evaluation today patient's wound which she has the cast of the left leg actually appears to be doing significantly better. Fortunately I do not see any signs of active infection locally or systemically which is great news and overall I am extremely pleased with where we stand currently. 7/26; patient presents for follow-up. She has [name] cast in place for the past week. She states it irritated her shin. Other than that she tolerated the cast well. She states she would like [name] break for 1 week from the  cast. We have been using Keystone antibiotic and Aquacel to both wound beds. She denies signs of infection. 8/2; patient presents for follow-up. She has been using Keystone and Aquacel to the wound beds. She denies any issues and has no complaints. She is agreeable to have the cast placed today for the left leg. 06-03-2022 upon evaluation today patient appears to be doing well with regard to her wound she saw some signs of improvement which is great news. Fortunately I do not see any evidence of active infection locally or systemically at this time which is great news. No fevers, chills, nausea, vomiting, or  diarrhea. 8/16; patient presents for follow-up. She has no issues or complaints today. We have been using Keystone and Aquacel to the wound beds. The left lower extremity is in [name] total contact cast. She is tolerated this well. 8/23; patient presents for follow-up. She has had the total contact cast on the left leg for the past week. Unfortunately this has rubbed and broken down the skin to the medial foot. She currently denies signs of infection. She has been using Keystone antibiotic to the right plantar foot wound. 8/30; patient presents for follow-up. We have held off on the total contact cast for the left leg for the past week. Her wound on the left foot has improved and the previous surrounding breakdown of skin has epithelialized. She has been using Keystone antibiotic to both wound beds. She has no issues or complaints today. She denies signs of infection. 9/6; patient presents for follow-up. She has ordered her's Keystone antibiotic and this is arriving this week. She has been doing Dakin's wet-to-dry dressings to the wound beds. She denies signs of infection. She is agreeable to the total contact cast today. 9/13; patient presents for follow-up. She states that the cast caused her left leg shin to be sore. She would like to take [name] break from the cast this week. She has been using  Keystone antibiotic to the right plantar foot wound. She denies signs of infection. 9/20; patient presents for follow-up. She has been using Keystone antibiotics to the wound beds with calcium alginate to the right foot wound and Hydrofera Blue to the left foot wound. She is agreeable to having the cast placed today. She has been approved for Apligraf and we will order this for next clinic visit. [name], [name] ([id number]) [id number] Page 17 of 22 9/27; patient presents for follow-up. We have been using Keystone antibiotic with Hydrofera Blue to the left foot wound under [name] total contact cast. T the right o foot wound she has been using Keystone antibiotic and calcium alginate. She declines [name] total contact cast today. Apligraf is available for placement and she would like to proceed with this. 07-28-2022 upon evaluation today patient appears to be doing well currently in regard to her wound. She is actually showing signs of significant improvement which is great news. Fortunately I do not see any evidence of active infection locally nor systemically at this time. She has been seeing Dr. Heber [place] and to be honest has been doing very well with the cast. Subsequently she comes in today with [name] cast on and we did reapply that today as well. She did not really want to she try to talk me out of that but I explained that if she wanted to heal this is really the right way to go. Patient voiced understanding. In regard to her right foot this is actually [name] lot better compared to the last time I saw her which is also great news. 10/11; patient presents for follow-up. Apligraf and the total contact cast was placed to the left leg at last clinic visit. She states that her right foot wound had burning pain to it with the placement of Apligraf to this area. She has been doing Godfrey over this area. She denies signs of infection including increased warmth, erythema or purulent  drainage. 11/1; 3-week follow-up. The patient fortunately did not have [name] total contact cast or an Apligraf and on the left foot. She has been using Keystone ABD pads and kerlix and her own running shoes She  arrives in clinic today with thick callus and [name] very poor surface on the left foot on the right nonviable skin subcutaneous tissue and [name] deep probing hole. 11/15; patient missed her last clinic appointment. She states she has not been dressing the wound beds for the past 2 weeks. She states that at she had [name] new roommate but is now going back to live with her mother. Apparently its been [name] distracting 2 weeks. Patient currently denies signs of infection. 11/22; patient presents for follow-up. She states she has been using Keystone antibiotic and Dakin's wet-to-dry dressings to the wound beds. She is agreeable for cast placement today. We had ordered Apligraf however this has not been received by our facility. 11/29; Patient had [name] total contact cast placed at last clinic visit and she tolerated this well. We were using silver alginate under the cast. Patient's been using Keystone antibiotic with Aquacel to the right plantar foot wound. She has no issues or complaints today. Apligraf is available for placement today. Patient would like to proceed with this. 12/6; patient presents for follow-up. She had Apligraf placed in standard fashion last clinic visit under the total contact cast to the left lower extremity. She has been using Keystone antibiotic and Aquacel to the right plantar foot wound. She has no issues or complaints today. 12/13; patient presents for follow-up. She has finished 5 Apligraf placements. Was told she would not qualify for more. We have been doing [name] total contact cast to the left lower extremity. She has been using Keystone antibiotic and Aquacel to the right plantar foot wound. She has no issues or complaints today. 12/20; patient presents for follow-up. We have been using  Hydrofera Blue with Keystone antibiotic under [name] total contact cast of the left lower extremity. She reports using Keystone antibiotic and silver alginate to the right heel wound. She has no issues or complaints today. 12/27; patient presents with [name] healthy wound on the left midfoot. We have Apligraf to apply that to that more also using [name] total contact cast. On the right we are using Keystone and silver alginate. She is offloading the right heel with [name] surgical shoealthough by her admission she is on her feet quite [name] bit 1/3; patient presents for follow-up. Apligraf was placed to the wound beds last clinic visit. She was placed in [name] total contact cast to the left lower extremity. She declines [name] total contact cast today. She states that her mother is in the hospital and she cannot adequately get around with the cast on. 1/10; patient presents for follow-up. She declined the total contact cast at last clinic visit. Both wounds have declined in appearance. She states that she has been on her feet and not offloading the wound beds. She currently denies signs of infection. 1/17; patient presents for follow-up. She had the total contact cast along with Apligraf placed last week to the left lower extremity. She tolerated this well. She has been using Aquacel Ag and Keystone antibiotic to the right heel wound. She currently denies signs of infection. She has no issues or complaints today. 1/24; patient presents for follow-up. We have been using Apligraf to the left foot wound along with [name] total contact cast. She has done well with this. T the right o heel wound she has been using Aquacel Ag and Keystone antibiotic ointment. She has no issues or complaints today. She denies signs of infection. 1/31; patient presents for follow-up. We have been using Apligraf to the left foot  wound along with the total contact cast. She continues to do well with this. To the right heel we have been using Aquacel Ag and Keystone  antibiotic ointment. She has no issues or complaints today. 12-01-2022 upon evaluation patient is seen today on my schedule due to the fact that she unfortunately was in the hospital yesterday. Her cast needed to come off the only reason she is out of the hospital is due to the fact that they would not take it off at the hospital which is somewhat bewildered reading to me to be perfectly honest. I am not certain why this was but either way she was released and then was placed on my schedule today in order to get this off and reapply the total contact casting as appropriate. I do not have an Apligraf for her today it was applied last week and today's actually expired yesterday as there was some scheduling conflicts with her being in the hospital. Nonetheless we do not have that for reapplication today but the good news that she is not draining too much and the Apligraf can go for up to 2 weeks so I am going to go ahead and reapply the total contact casting but we are going to leave the Apligraf in place. 2/14; patient presents for follow-up. T the left leg she has had the total contact cast and Apligraf for the past week. She has had no issues with this. T the o o right heel she has been using antibiotic ointment and Aquacel Ag. 2/21; patient presents for follow-up. We have been using Apligraf and [name] total contact cast to the left lower extremity. She is tolerated this well. Unfortunately she is not approved for any more Apligraf per insurance. She has been using antibiotic ointment and Aquacel Ag to the right foot. She has no issues or complaints today. 2/28; patient presents for follow-up. We have been using Hydrofera Blue and antibiotic ointment under the total contact cast to the left lower extremity. He has been using Aquacel Ag with antibiotic ointment to the right plantar foot. She has no issues or complaints today. 3/6; patient presents for follow-up. She did not obtain her gentamicin ointment.  She has been using Aquacel Ag to the right plantar foot wound. We have been using Hydrofera Blue with antibiotic ointment under the total contact cast to the left lower extremity. She has no issues or complaints today. Objective Constitutional Vitals Time Taken: 8:35 AM, Height: 69 in, Weight: 178 lbs, BMI: 26.3, Temperature: 98.2 F, Pulse: 59 bpm, Respiratory Rate: 18 breaths/min, Blood Pressure: 107/57 mmHg. DAESIA, CREEDEN [name] ([id number]) [id number] Page 18 of 22 General Notes: Right foot: T the plantar heel there is an incision site with increased depth, No probing to bone. T the opening there is Nonviable tissue and o o granulation tissue. Left foot: T the medial aspect there is an open wound with granulation tissue, Callus and nonviable tissue. No overt signs of infection to o any of the wound beds. Integumentary (Hair, Skin) Wound #11 status is Open. Original cause of wound was Surgical Injury. The date acquired was: 12/01/2021. The wound has been in treatment 41 weeks. The wound is located on the [place]. The wound measures 0.5cm length x 0.3cm width x 0.4cm depth; 0.118cm^2 area and 0.047cm^3 volume. There is Fat Layer (Subcutaneous Tissue) exposed. There is [name] medium amount of serosanguineous drainage noted. The wound margin is distinct with the outline attached to the wound base. There is medium (34-66%) pale  granulation within the wound bed. There is [name] medium (34-66%) amount of necrotic tissue within the wound bed. Wound #12 status is Open. Original cause of wound was Pressure Injury. The date acquired was: 03/16/2020. The wound has been in treatment 41 weeks. The wound is located on the [name] [place]. The wound measures 0.9cm length x 0.74cm width x 0.2cm depth; 0.523cm^2 area and 0.105cm^3 volume. There is Fat Layer (Subcutaneous Tissue) exposed. There is [name] medium amount of serous drainage noted. The wound margin is flat and intact.  There is small (1- 33%) red, pink granulation within the wound bed. There is [name] large (67-100%) amount of necrotic tissue within the wound bed including Adherent Slough. 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 I debrided nonviable tissue. Measurements are larger for the left plantar wound after callus was debrided. No signs of infection. I recommended continuing the course with Hydrofera Blue and antibiotic ointment under the total contact cast to the left lower extremity. Continue Aquacel to the right plantar heel wound. Recommended she pick up gentamicin and start using this. Procedures Wound #11 Pre-procedure diagnosis of Wound #11 is an Open Surgical Wound located on the Right,Plantar Foot . There was [name] Selective/Open Wound Non-Viable Tissue Debridement with [name] total area of 0.15 sq cm performed by Heather Shan, MD. With the following instrument(s): Curette to remove Viable and Non-Viable tissue/material. Material removed includes [place]. No specimens were taken.[name] Moderate amount of bleeding was controlled with Pressure. The procedure was tolerated well. Post Debridement Measurements: 0.5cm length x 0.3cm width x 0.5cm depth; 0.059cm^3 volume. Character of Wound/Ulcer Post Debridement is stable. Post procedure Diagnosis Wound #11: Same as Pre-Procedure Wound #12 Pre-procedure diagnosis of Wound #12 is [name] Diabetic Wound/Ulcer of the Lower Extremity located on the Left,Medial,Plantar Foot .Severity of Tissue Pre Debridement is: Fat layer exposed. There was [name] Excisional Skin/Subcutaneous Tissue Debridement with [name] total  area of 0.63 sq cm performed by Heather Shan, MD. With the following instrument(s): Curette to remove Viable and Non-Viable tissue/material. Material removed includes Callus, Subcutaneous Tissue, and Slough. No specimens were taken.[name] Moderate amount of bleeding was controlled with Pressure. The procedure was tolerated well. Post Debridement Measurements: 0.9cm length x 0.7cm width x 0.3cm depth; 0.148cm^3 volume. Character of Wound/Ulcer Post Debridement is stable. Severity of Tissue Post Debridement is: Fat layer exposed. Post procedure Diagnosis Wound #12: Same as Pre-Procedure Pre-procedure diagnosis of Wound #12 is [name] Diabetic Wound/Ulcer of the Lower Extremity located on the Left,Medial,Plantar Foot . There was [name] T Contact otal Cast Procedure by Heather Shan, MD. Post procedure Diagnosis Wound #12: Same as Pre-Procedure Plan Follow-up Appointments: Return Appointment in 1 week. Nurse Visit as needed Bathing/ Shower/ Hygiene: May shower with wound dressing protected with water repellent cover or cast protector. No tub bath. Anesthetic (Use 'Patient Medications' Section for Anesthetic Order Entry): Lidocaine applied to wound bed Edema Control - Lymphedema / Segmental Compressive Device / Other: Elevate, Exercise Daily and Avoid Standing for Long Periods of Time. Elevate legs to the level of the heart and pump ankles as often as possible Elevate leg(s) parallel to the floor when sitting. Off-Loading: T Contact Cast to Left Lower Extremity otal  Open toe surgical shoe - Heel off loader - Right foot RIDLEY, SPIRA [name] ([id number]) [id number] Page 19 of 22 Other: - keep pressure off of feet. Additional Orders / Instructions: Follow Nutritious Diet and Increase Protein Intake Other: - Stay off of foot, wheelchair only Medications-Please add to medication list.: Wound #11 Right,Plantar Foot: Keystone Compound - use at home Topical Antibiotic - apply  Gentamycin in office only WOUND #11: - Foot Wound Laterality: Plantar, Right Cleanser: Wound Cleanser 1 x Per Day/30 Days Discharge Instructions: Wash your hands with soap and water. Remove old dressing, discard into plastic bag and place into trash. Cleanse the wound with Wound Cleanser prior to applying [name] clean dressing using gauze sponges, not tissues or cotton balls. Do not scrub or use excessive force. Pat dry using gauze sponges, not tissue or cotton balls. Topical: Gentamicin 1 x Per Day/30 Days Discharge Instructions: Apply as directed by provider. in clinic Prim Dressing: Aquacel Extra Hydrofiber Dressing, 2x2 (in/in) 1 x Per Day/30 Days ary Secondary Dressing: ABD Pad 5x9 (in/in) (Generic) 1 x Per Day/30 Days Discharge Instructions: Cover with ABD pad Secured With: Medipore T - 1M Medipore H Soft Cloth Surgical T ape ape, 2x2 (in/yd) 1 x Per Day/30 Days Secured With: [employer] Sterile or Non-Sterile 6-ply 4.5x4 (yd/yd) 1 x Per Day/30 Days Discharge Instructions: Apply Kerlix as directed WOUND #12: - Foot Wound Laterality: Plantar, Left, Medial Cleanser: Wound Cleanser 1 x Per Day/30 Days Discharge Instructions: Wash your hands with soap and water. Remove old dressing, discard into plastic bag and place into trash. Cleanse the wound with Wound Cleanser prior to applying [name] clean dressing using gauze sponges, not tissues or cotton balls. Do not scrub or use excessive force. Pat dry using gauze sponges, not tissue or cotton balls. Topical: Gentamicin 1 x Per Day/30 Days Discharge Instructions: Apply as directed by provider. in clinic Prim Dressing: Hydrofera Blue Ready Transfer Foam, 2.5x2.5 (in/in) 1 x Per Day/30 Days ary Discharge Instructions: Apply Hydrofera Blue Ready to wound bed as directed Prim Dressing: Silvercel 4 1/4x 4 1/4 (in/in) 1 x Per Day/30 Days ary Discharge Instructions: Apply to periwound Secondary Dressing: ABD Pad 5x9 (in/in) (Generic) 1 x Per Day/30  Days Discharge Instructions: Cover with ABD pad Secured With: Medipore T - 1M Medipore H Soft Cloth Surgical T ape ape, 2x2 (in/yd) 1 x Per Day/30 Days Secured With: Kerlix Roll Sterile or Non-Sterile 6-ply 4.5x4 (yd/yd) 1 x Per Day/30 Days Discharge Instructions: Apply Kerlix as directed 1. In office sharp debridement 2. Hydrofera Blue and antibiotic ointmentooleft plantar foot wound 3. T contact cast placed in standard fashion otal 4. Aquacel with antibiotic ointmentooright plantar heel wound 5. Follow-up in 1 week Electronic Signature(s) Signed: 12/28/2022 1:18:38 PM By: Heather Shan DO Entered By: [name] on 12/28/2022 09:32:00 -------------------------------------------------------------------------------- ROS/PFSH Details Patient Name: Date of Service: [name], Ardyn [name]. 12/28/2022 8:30 [name]: [id number] Patient Account Number: [id number] Date of Birth/Sex: Treating RN: February 21, 1976 (47 y.o. [name] Primary Care Provider: Raelene [name] Other Clinician: Referring Provider: Treating Provider/Extender: [name] in Treatment: 77 Information Obtained From Patient Ear/Nose/Mouth/Throat Medical History: Positive for: Chronic sinus problems/congestion; Middle ear problems LOANNE, LAMARRE [name] ([id number]) [id number] Page 20 of 22 Hematologic/Lymphatic Medical History: Positive for: Anemia Respiratory Medical History: Positive for: Chronic Obstructive Pulmonary Disease (COPD) Cardiovascular Medical History: Positive for: Congestive Heart Failure - EF 33% Endocrine Medical History: Positive for: Type II Diabetes Time with diabetes: 14 years  Treated with: Insulin, Oral agents Blood sugar tested every day: No Blood sugar testing results: Bedtime: 176 Genitourinary Medical History: Positive for: End Stage Renal Disease Integumentary (Skin) Medical History: Positive for: History of  pressure wounds Neurologic Medical History: Positive for: Neuropathy - feel and legs HBO Extended History Items Ear/Nose/Mouth/Throat: Ear/Nose/Mouth/Throat: Chronic sinus Middle ear problems problems/congestion Immunizations Pneumococcal Vaccine: Received Pneumococcal Vaccination: No Implantable Devices No devices added Family and Social History Current every day smoker; Marital Status - Single; Alcohol Use: Never; Drug Use: Prior History; Caffeine Use: Daily Electronic Signature(s) Signed: 12/28/2022 1:18:38 PM By: Heather Shan DO Signed: 12/28/2022 4:47:39 PM By: [name] Entered By: [name] on 12/28/2022 09:32:53 [name] ([id number]) [id number] Page 21 of 22 -------------------------------------------------------------------------------- Total Contact Cast Details Patient Name: Date of Service: MADLINE, SLIWA 12/28/2022 8:30 [name]: [id number] Patient Account Number: [id number] Date of Birth/Sex: Treating RN: Mar 04, 1976 (47 y.o. [name] Primary Care Provider: Raelene [name] Other Clinician: Referring Provider: Treating Provider/Extender: [name] in Treatment: 36 T Contact Cast Applied for Wound Assessment: otal Wound #12 Left,Medial,Plantar Foot Performed By: Physician Heather Shan, MD Post Procedure Diagnosis Same as Pre-procedure Electronic Signature(s) Signed: 12/28/2022 1:18:38 PM By: Heather Shan DO Signed: 12/28/2022 4:47:39 PM By: [name] Entered By: [name] on 12/28/2022 09:08:11 -------------------------------------------------------------------------------- [facility] Details Patient Name: Date of Service: Michel Harrow 12/28/2022 Medical Record Number: [id number] Patient Account Number: [id number] Date of Birth/Sex: Treating RN: 16-Aug-1976 (47 y.o. [name] Primary Care Provider: Raelene [name]  Other Clinician: Referring Provider: Treating Provider/Extender: [name] in Treatment: 41 Diagnosis Coding ICD-10 Codes Code Description [phone] Non-pressure chronic ulcer of other part of left foot with other specified severity 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.621 Type 2 diabetes mellitus with foot ulcer E11.42 Type 2 diabetes mellitus with diabetic polyneuropathy L97.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 of skin Facility Procedures : CPT4 Code: [id number] Description: [place] - DEB SUBQ TISSUE 20 SQ CM/< ICD-10 Diagnosis Description 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i E11.621 Type 2 diabetes mellitus with foot ulcer Modifier: ty Quantity: 1 : CPT4 Code: [id number] Description: [id number] - DEBRIDE WOUND 1ST 20 SQ CM OR < ICD-10 Diagnosis Description 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.621 Type 2 diabetes mellitus with foot ulcer Modifier: Quantity: 1 Physician Procedures : CPT4 Code Description Modifier [id number] - WC PHYS SUBQ TISS 20 SQ CM ICD-10 Diagnosis Description 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ity ELDORIS, UPSHAW [name] ([id number])  [id number] E11.621 Type 2 diabetes mellitus with foot ulcer Quantity: 1 7.pdf Page 22 of 22 : [id number] - WC PHYS DEBR WO ANESTH 20 SQ CM 1 ICD-10 Diagnosis Description 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.621 Type 2 diabetes mellitus with foot ulcer Quantity: Electronic Signature(s) Signed: 12/28/2022 1:18:38 PM By: Heather Shan DO Entered By: [name] on 12/28/2022 09:32:18

## 2022-12-30 NOTE — Progress Notes (Signed)
[name], [name] ([id number]) [id number] Page 1 of 9 Visit Report for 12/28/2022 Arrival Information Details Patient Name: Date of Service: [name], [name] 12/28/2022 8:30 [name] Medical Record Number: [id number] Patient Account Number: [id number] Date of Birth/Sex: Treating RN: 1976/01/07 (47 y.o. [name] Primary Care [name]: [name] Other Clinician: Referring [name]: Treating [name]/Extender: [name] in Treatment: 39 Visit Information History Since Last Visit Added or deleted any medications: No Patient Arrived: Wheel Chair Has Dressing in Place as Prescribed: Yes Arrival Time: 08:31 Pain Present Now: No Accompanied By: Mother Transfer Assistance: None Patient Identification Verified: Yes Secondary Verification Process Completed: Yes Patient Requires Transmission-Based Precautions: No Patient Has Alerts: No Electronic Signature(s) Signed: 12/28/2022 4:47:39 PM By: [name] Entered By: [name] on 12/28/2022 08:33:25 -------------------------------------------------------------------------------- Clinic Level of Care Assessment Details Patient Name: Date of Service: [name], [name] 12/28/2022 8:30 [name] Medical Record Number: [id number] Patient Account Number: [id number] Date of Birth/Sex: Treating RN: [birth year] (47 y.o. [name] Primary Care Roopa Graver: [name] Other Clinician: Referring Leilanny Fluitt: Treating Rachal Dvorsky/Extender: [name] in Treatment: 38 Clinic Level of Care Assessment Items TOOL 1 Quantity Score '[]'$  - 0 Use when EandM and Procedure is performed on INITIAL visit ASSESSMENTS - Nursing Assessment / Reassessment '[]'$  - 0 General Physical Exam (combine w/ comprehensive assessment (listed just below) when performed on new pt. evals) '[]'$  - 0 Comprehensive Assessment (HX, ROS, Risk Assessments, Wounds Hx, etc.) ASSESSMENTS - Wound and  Skin Assessment / Reassessment '[]'$  - 0 Dermatologic / Skin Assessment (not related to wound area) ASSESSMENTS - Ostomy and/or Continence Assessment and Care [name] ([id number]) [id number] Page 2 of 9 '[]'$  - 0 Incontinence Assessment and Management '[]'$  - 0 Ostomy Care Assessment and Management (repouching, etc.) PROCESS - Coordination of Care '[]'$  - 0 Simple Patient / Family Education for ongoing care '[]'$  - 0 Complex (extensive) Patient / Family Education for ongoing care '[]'$  - 0 Staff obtains Programmer, systems, Records, T Results / Process Orders est '[]'$  - 0 Staff telephones HHA, Nursing Homes / Clarify orders / etc '[]'$  - 0 Routine Transfer to another Facility (non-emergent condition) '[]'$  - 0 Routine Hospital Admission (non-emergent condition) '[]'$  - 0 New Admissions / Biomedical engineer / Ordering NPWT Apligraf, etc. , '[]'$  - 0 Emergency Hospital Admission (emergent condition) PROCESS - Special Needs '[]'$  - 0 Pediatric / Minor Patient Management '[]'$  - 0 Isolation Patient Management '[]'$  - 0 Hearing / Language / Visual special needs '[]'$  - 0 Assessment of Community assistance (transportation, D/C planning, etc.) '[]'$  - 0 Additional assistance / Altered mentation '[]'$  - 0 Support Surface(s) Assessment (bed, cushion, seat, etc.) INTERVENTIONS - Miscellaneous '[]'$  - 0 External ear exam '[]'$  - 0 Patient Transfer (multiple staff / Civil Service fast streamer / Similar devices) '[]'$  - 0 Simple Staple / Suture removal (25 or less) '[]'$  - 0 Complex Staple / Suture removal (26 or more) '[]'$  - 0 Hypo/Hyperglycemic Management (do not check if billed separately) '[]'$  - 0 Ankle / Brachial Index (ABI) - do not check if billed separately Has the patient been seen at the hospital within the last three years: Yes Total Score: 0 Level Of Care: ____ Electronic Signature(s) Signed: 12/28/2022 4:47:39 PM By: [name] Entered By: [name] on 12/28/2022  09:08:40 -------------------------------------------------------------------------------- Encounter Discharge Information Details Patient Name: Date of Service: [name], [name] 12/28/2022 8:30 [name] Medical Record Number: [id number] Patient  Account Number: [id number] Date of Birth/Sex: Treating RN: 04-12-1976 (47 y.o. [name] Primary Care [name]: [name] Other Clinician: Referring Noam Karaffa: Treating Shadasia Oldfield/Extender: [name] in Treatment: 30 Encounter Discharge Information Items Post Procedure Vitals Discharge Condition: Stable Temperature (F): 98.2 Noon, Affie [name] ([id number]) [id number] Page 3 of 9 Ambulatory Status: Wheelchair Pulse (bpm): 59 Discharge Destination: Home Respiratory Rate (breaths/min): 18 Transportation: Private Auto Blood Pressure (mmHg): 107/57 Accompanied By: Mother Schedule Follow-up Appointment: Yes Clinical Summary of Care: Electronic Signature(s) Signed: 12/28/2022 4:47:39 PM By: [name] Entered By: [name] on 12/28/2022 09:12:01 -------------------------------------------------------------------------------- Lower Extremity Assessment Details Patient Name: Date of Service: [name] 12/28/2022 8:30 [name] Medical Record Number: [id number] Patient Account Number: [id number] Date of Birth/Sex: Treating RN: 10-Mar-1976 (47 y.o. [name] Primary Care Breaker Springer: [name] Other Clinician: Referring Corwyn Vora: Treating Ora Mcnatt/Extender: [name] in Treatment: 33 Electronic Signature(s) Signed: 12/28/2022 4:47:39 PM By: [name] Entered By: [name] on 12/28/2022 09:02:39 -------------------------------------------------------------------------------- Multi Wound Chart Details Patient Name: Date of Service: [name] 12/28/2022 8:30 [name] Medical Record Number: [id number] Patient Account  Number: [id number] Date of Birth/Sex: Treating RN: [birth year] (47 y.o. 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: Extremity Chronic sinus problems/congestion, Chronic sinus problems/congestion, N/[name] Comorbid History: Middle ear problems, Anemia, Chronic Middle ear problems, Anemia, Chronic Obstructive Pulmonary Disease Obstructive Pulmonary Disease (COPD), Congestive Heart Failure, (COPD), Congestive Heart Failure, Type II Diabetes, End Stage Renal Type II Diabetes, End Stage Renal Disease, History of pressure wounds, Disease, History of pressure wounds, Neuropathy Neuropathy 12/01/2021 03/16/2020 N/[name] Date Acquired: 41 41 N/[name] Weeks of Treatment: Open Open N/[name] Wound Status: No No N/[name] Wound Recurrence: 0.5x0.3x0.4 0.9x0.74x0.2 N/[name] Measurements L x W x D (cm) 0.118 0.523 N/[name] [name] (cm) : rea 0.047 0.105 N/[name] Volume (cm) : 93.60% 88.90% N/[name] % Reduction in Area: 97.20% 88.90% N/[name] % Reduction in Volume: Full Thickness Without Exposed Grade 3 N/[name] Classification: Support Structures Medium Medium N/[name] Exudate Amount: Serosanguineous Serous N/[name] Exudate Type: red, brown amber N/[name] Exudate Color: Distinct, outline attached Flat and Intact N/[name] Wound Margin: Medium (34-66%) Small (1-33%) N/[name] Granulation Amount: Pale Red, Pink N/[name] Granulation Quality: Medium (34-66%) Large (67-100%) N/[name] Necrotic Amount: Fat Layer  (Subcutaneous Tissue): Yes Fat Layer (Subcutaneous Tissue): Yes N/[name] Exposed Structures: Fascia: No Fascia: No Tendon: No Tendon: No Muscle: No Muscle: No Joint: No Joint: No Bone: No Bone: No Medium (34-66%) None N/[name] Epithelialization: Treatment Notes Electronic Signature(s) Signed: 12/28/2022 4:47:39 PM By: [name] Entered By: [name] on 12/28/2022 09:04:03 -------------------------------------------------------------------------------- Pain Assessment Details Patient Name: Date of Service: [name] 12/28/2022 8:30 [name] Medical Record Number: [id number] Patient Account Number: [id number] Date of Birth/Sex: Treating RN: [birth year] (47 y.o. [name] Primary Care Mei Suits: [name] Other Clinician: Referring Rodert Hinch: Treating Makana Feigel/Extender: [name] in Treatment: 45 Active Problems Location of Pain Severity and Description of Pain Patient Has Paino No Site Locations Scarlata, Deniss [name] (  [id number]) [id number] Page 5 of 9 Pain Management and Medication Current Pain Management: Electronic Signature(s) Signed: 12/28/2022 4:47:39 PM By: [name] Entered By: [name] on 12/28/2022 08:38:52 -------------------------------------------------------------------------------- Patient/Caregiver Education Details Patient Name: Date of Service: [name] 3/6/2024andnbsp8:30 Bryant Record Number: [id number] Patient Account Number: [id number] Date of Birth/Gender: Treating RN: 08-13-1976 (47 y.o. [name] Primary Care Physician: [name] Other Clinician: Referring Physician: Treating Physician/Extender: [name] in Treatment: 55 Education Assessment Education Provided To: Patient Education Topics Provided Wound Debridement: Handouts: Wound Debridement Methods: Explain/Verbal Responses: State content  correctly Wound/Skin Impairment: Handouts: Caring for Your Ulcer Methods: Explain/Verbal Responses: State content correctly Electronic Signature(s) Signed: 12/28/2022 4:47:39 PM By: [name] Entered By: [name] on 12/28/2022 09:10:49 [name] ([id number]) [id number] Page 6 of 9 -------------------------------------------------------------------------------- Wound Assessment Details Patient Name: Date of Service: [name], [name] 12/28/2022 8:30 [name] Medical Record Number: [id number] Patient Account Number: [id number] Date of Birth/Sex: Treating RN: [birth year] (47 y.o. [name] Primary Care Latrisa Hellums: [name] Other Clinician: Referring Travius Crochet: Treating Brittian Renaldo/Extender: Linna Hoff Weeks in Treatment: 41 Wound Status Wound Number: 11 Primary Open Surgical Wound Etiology: Wound Location: Right, Plantar Foot Wound Open Wounding Event: Surgical Injury Status: Date Acquired: 12/01/2021 Comorbid Chronic sinus problems/congestion, Middle ear problems, Anemia, Weeks Of Treatment: 41 History: Chronic Obstructive Pulmonary Disease (COPD), Congestive Heart Clustered Wound: No Failure, Type II Diabetes, End Stage Renal Disease, History of pressure wounds, Neuropathy Photos Wound Measurements Length: (cm) 0.5 Width: (cm) 0.3 Depth: (cm) 0.4 Area: (cm) 0.118 Volume: (cm) 0.047 % Reduction in Area: 93.6% % Reduction in Volume: 97.2% Epithelialization: Medium (34-66%) Wound Description Classification: Full Thickness Without Exposed Suppo Wound Margin: Distinct, outline attached Exudate Amount: Medium Exudate Type: Serosanguineous Exudate Color: red, brown rt Structures Foul Odor After Cleansing: No Slough/Fibrino Yes Wound Bed Granulation Amount: Medium (34-66%) Exposed Structure Granulation Quality: Pale Fascia Exposed: No Necrotic Amount: Medium (34-66%) Fat Layer (Subcutaneous Tissue)  Exposed: Yes Tendon Exposed: No Muscle Exposed: No Joint Exposed: No Bone Exposed: No Treatment Notes Wound #11 (Foot) Wound Laterality: Plantar, Right Cleanser Wound Cleanser Discharge Instruction: Wash your hands with soap and water. Remove old dressing, discard into plastic bag and place into trash. Cleanse the [name], [name] [name] ([id number]) [id number] Page 7 of 9 wound with Wound Cleanser prior to applying [name] clean dressing using gauze sponges, not tissues or cotton balls. Do not scrub or use excessive force. Pat dry using gauze sponges, not tissue or cotton balls. Peri-Wound Care Topical Gentamicin Discharge Instruction: Apply as directed by Jeaneane Adamec. in clinic Primary Dressing Aquacel Extra Hydrofiber Dressing, 2x2 (in/in) Secondary Dressing ABD Pad 5x9 (in/in) Discharge Instruction: Cover with ABD pad Secured With Nambe Surgical T ape ape, 2x2 (in/yd) Kerlix Roll Sterile or Non-Sterile 6-ply 4.5x4 (yd/yd) Discharge Instruction: Apply Kerlix as directed Compression Wrap Compression Stockings Add-Ons Electronic Signature(s) Signed: 12/28/2022 4:47:39 PM By: [name] Entered By: [name] on 12/28/2022 08:56:38 -------------------------------------------------------------------------------- Wound Assessment Details Patient Name: Date of Service: [name] 12/28/2022 8:30 [name] Medical Record Number: [id number] Patient Account Number: [id number] Date of Birth/Sex: Treating RN: [birth year] (47 y.o. [name] Primary Care Zhoe Catania: [name] Other Clinician: Referring Lexa Coronado: Treating Elber Galyean/Extender: Linna Hoff Weeks in Treatment: 41 Wound Status Wound Number: 12 Primary Diabetic Wound/Ulcer of the Lower Extremity Etiology:  Wound Location: Left, Medial, Plantar Foot Wound Open Wounding Event: Pressure Injury Status: Date Acquired: 03/16/2020 Comorbid  Chronic sinus problems/congestion, Middle ear problems, Anemia, Weeks Of Treatment: 41 History: Chronic Obstructive Pulmonary Disease (COPD), Congestive Heart Clustered Wound: No Failure, Type II Diabetes, End Stage Renal Disease, History of pressure wounds, Neuropathy Photos [name], [name] ([id number]) [id number] Page 8 of 9 Wound Measurements Length: (cm) 0.9 Width: (cm) 0.74 Depth: (cm) 0.2 Area: (cm) 0.523 Volume: (cm) 0.105 % Reduction in Area: 88.9% % Reduction in Volume: 88.9% Epithelialization: None Wound Description Classification: Grade 3 Wound Margin: Flat and Intact Exudate Amount: Medium Exudate Type: Serous Exudate Color: amber Foul Odor After Cleansing: No Slough/Fibrino No Wound Bed Granulation Amount: Small (1-33%) Exposed Structure Granulation Quality: Red, Pink Fascia Exposed: No Necrotic Amount: Large (67-100%) Fat Layer (Subcutaneous Tissue) Exposed: Yes Necrotic Quality: Adherent Slough Tendon Exposed: No Muscle Exposed: No Joint Exposed: No Bone Exposed: No Treatment Notes Wound #12 (Foot) Wound Laterality: Plantar, Left, Medial Cleanser Wound Cleanser Discharge Instruction: Wash your hands with soap and water. Remove old dressing, discard into plastic bag and place into trash. Cleanse the wound with Wound Cleanser prior to applying [name] clean dressing using gauze sponges, not tissues or cotton balls. Do not scrub or use excessive force. Pat dry using gauze sponges, not tissue or cotton balls. Peri-Wound Care Topical Gentamicin Discharge Instruction: Apply as directed by Yojan Paskett. in clinic Primary Dressing Hydrofera Blue Ready Transfer Foam, 2.5x2.5 (in/in) Discharge Instruction: Apply Hydrofera Blue Ready to wound bed as directed [place]cel 4 1/4x 4 1/4 (in/in) Discharge Instruction: Apply to periwound Secondary Dressing ABD Pad 5x9 (in/in) Discharge Instruction: Cover with ABD pad Secured With Hope H Soft Cloth Surgical T ape ape, 2x2 (in/yd) Kerlix Roll Sterile or Non-Sterile 6-ply 4.5x4 (yd/yd) Discharge Instruction: Apply Kerlix as directed Compression Wrap Compression Stockings Add-Ons Electronic Signature(s) Signed: 12/28/2022 4:47:39 PM By: [name] Entered By: [name] on 12/28/2022 08:57:03 [name] ([id number]) [id number] Page 9 of 9 -------------------------------------------------------------------------------- Vitals Details Patient Name: Date of Service: [name], [name] 12/28/2022 8:30 [name] Medical Record Number: [id number] Patient Account Number: [id number] Date of Birth/Sex: Treating RN: [birth year] (47 y.o. [name] Primary Care Kewon Statler: [name] Other Clinician: Referring Cortlynn Hollinsworth: Treating Lamees Gable/Extender: [name] in Treatment: 60 Vital Signs Time Taken: 08:35 Temperature (F): 98.2 Height (in): 69 Pulse (bpm): 59 Weight (lbs): 178 Respiratory Rate (breaths/min): 18 Body Mass Index (BMI): 26.3 Blood Pressure (mmHg): 107/57 Reference Range: 80 - 120 mg / dl Electronic Signature(s) Signed: 12/28/2022 4:47:39 PM By: [name] Entered By: [name] on 12/28/2022 08:38:46

## 2023-01-03 ENCOUNTER — Encounter (HOSPITAL_BASED_OUTPATIENT_CLINIC_OR_DEPARTMENT_OTHER): Payer: Medicaid Other | Admitting: Internal Medicine

## 2023-01-03 DIAGNOSIS — E11621 Type 2 diabetes mellitus with foot ulcer: Secondary | ICD-10-CM

## 2023-01-03 DIAGNOSIS — L97528 Non-pressure chronic ulcer of other part of left foot with other specified severity: Secondary | ICD-10-CM

## 2023-01-03 DIAGNOSIS — L97512 Non-pressure chronic ulcer of other part of right foot with fat layer exposed: Secondary | ICD-10-CM | POA: Diagnosis not present

## 2023-01-04 ENCOUNTER — Encounter: Payer: Medicaid Other | Admitting: Internal Medicine

## 2023-01-04 NOTE — Progress Notes (Signed)
Heather, MOHAMMADI [name] ([id number]) [id number] Page 1 of 10 Visit Report for 01/03/2023 Clinic Level of Care Assessment Details Patient Name: Date of Service: [name], [name] 01/03/2023 3:45 PM Medical Record Number: [id number] Patient Account Number: [id number] Date of Birth/Sex: Treating RN: [birth year] (47 y.o. [name] Primary Care Caelen Reierson: Raelene Bott Other Clinician: Massie Kluver Referring Doloros Kwolek: Treating Natoshia Souter/Extender: Eddie North in Treatment: 58 Clinic Level of Care Assessment Items TOOL 1 Quantity Score '[]'$  - 0 Use when EandM and Procedure is performed on INITIAL visit ASSESSMENTS - Nursing Assessment / Reassessment '[]'$  - 0 General Physical Exam (combine w/ comprehensive assessment (listed just below) when performed on new pt. evals) '[]'$  - 0 Comprehensive Assessment (HX, ROS, Risk Assessments, Wounds Hx, etc.) ASSESSMENTS - Wound and Skin Assessment / Reassessment '[]'$  - 0 Dermatologic / Skin Assessment (not related to wound area) ASSESSMENTS - Ostomy and/or Continence Assessment and Care '[]'$  - 0 Incontinence Assessment and Management '[]'$  - 0 Ostomy Care Assessment and Management (repouching, etc.) PROCESS - Coordination of Care '[]'$  - 0 Simple Patient / Family Education for ongoing care '[]'$  - 0 Complex (extensive) Patient / Family Education for ongoing care '[]'$  - 0 Staff obtains Programmer, systems, Records, T Results / Process Orders est '[]'$  - 0 Staff telephones HHA, Nursing Homes / Clarify orders / etc '[]'$  - 0 Routine Transfer to another Facility (non-emergent condition) '[]'$  - 0 Routine Hospital Admission (non-emergent condition) '[]'$  - 0 New Admissions / Biomedical engineer / Ordering NPWT Apligraf, etc. , '[]'$  - 0 Emergency Hospital Admission (emergent condition) PROCESS - Special Needs '[]'$  - 0 Pediatric / Minor Patient Management '[]'$  - 0 Isolation Patient Management '[]'$  - 0 Hearing / Language / Visual special  needs '[]'$  - 0 Assessment of Community assistance (transportation, D/C planning, etc.) '[]'$  - 0 Additional assistance / Altered mentation '[]'$  - 0 Support Surface(s) Assessment (bed, cushion, seat, etc.) INTERVENTIONS - Miscellaneous '[]'$  - 0 External ear exam '[]'$  - 0 Patient Transfer (multiple staff / Civil Service fast streamer / Similar devices) '[]'$  - 0 Simple Staple / Suture removal (25 or less) '[]'$  - 0 Complex Staple / Suture removal (26 or more) Proch, Shaianne [name] ([id number]) [id number] Page 2 of 10 '[]'$  - 0 Hypo/Hyperglycemic Management (do not check if billed separately) '[]'$  - 0 Ankle / Brachial Index (ABI) - do not check if billed separately Has the patient been seen at the hospital within the last three years: Yes Total Score: 0 Level Of Care: ____ Electronic Signature(s) Signed: 01/04/2023 11:54:14 AM By: Massie Kluver Entered By: Massie Kluver on 01/03/2023 16:26:39 -------------------------------------------------------------------------------- Encounter Discharge Information Details Patient Name: Date of Service: Heather Hitch [name]. 01/03/2023 3:45 PM Medical Record Number: [id number] Patient Account Number: [id number] Date of Birth/Sex: Treating RN: [birth year] (47 y.o. [name] Primary Care Izeyah Deike: Raelene Bott Other Clinician: Massie Kluver Referring Jearldean Gutt: Treating Nyilah Kight/Extender: Eddie North in Treatment: 54 Encounter Discharge Information Items Post Procedure Vitals Discharge Condition: Stable Temperature (F): 98.5 Ambulatory Status: Wheelchair Pulse (bpm): 80 Discharge Destination: Home Respiratory Rate (breaths/min): 16 Transportation: Private Auto Blood Pressure (mmHg): 151/101 Accompanied By: mother Schedule Follow-up Appointment: Yes Clinical Summary of Care: Electronic Signature(s) Signed: 01/04/2023 11:54:14 AM By: Massie Kluver Entered By: Massie Kluver on 01/03/2023  17:09:52 -------------------------------------------------------------------------------- Lower Extremity Assessment Details Patient Name: Date of Service: [name] 01/03/2023 3:45 PM Medical Record Number: [id number] Patient Account Number: [id number] Date of Birth/Sex: Treating RN: 1976-02-25 (47 y.o. [name] Primary Care  Logann Whitebread: Raelene Bott Other Clinician: Massie Kluver Referring Saul Fabiano: Treating Kindall Swaby/Extender: Eddie North in Treatment: 97 Electronic Signature(s) Signed: 01/04/2023 7:50:42 AM By: Gretta Cool BSN, RN, CWS, [name] RN, BSN Signed: 01/04/2023 11:54:14 AM By: Cliffton Asters ([id number] Page 3 of 10 Entered By: Massie Kluver on 01/03/2023 16:16:34 -------------------------------------------------------------------------------- Multi Wound Chart Details Patient Name: Date of Service: [name] 01/03/2023 3:45 PM Medical Record Number: [id number] Patient Account Number: [id number] Date of Birth/Sex: Treating RN: 1975/11/09 (47 y.o. 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: Extremity Chronic sinus problems/congestion, Chronic sinus problems/congestion, N/[name] Comorbid History: Middle ear problems, Anemia, Chronic Middle ear problems, Anemia, Chronic Obstructive Pulmonary Disease Obstructive Pulmonary Disease (COPD), Congestive  Heart Failure, (COPD), Congestive Heart Failure, Type II Diabetes, End Stage Renal Type II Diabetes, End Stage Renal Disease, History of pressure wounds, Disease, History of pressure wounds, Neuropathy Neuropathy 12/01/2021 03/16/2020 N/[name] Date Acquired: 41 41 N/[name] Weeks of Treatment: Open Open N/[name] Wound Status: No No N/[name] Wound Recurrence: 0.8x0.5x0.4 1x0.9x0.1 N/[name] Measurements L x W x D (cm) 0.314 0.707 N/[name] [name] (cm) : rea 0.126 0.071 N/[name] Volume (cm) : 83.00% 85.00% N/[name] % Reduction in Area: 92.40% 92.50% N/[name] % Reduction in Volume: Full Thickness Without Exposed Grade 3 N/[name] Classification: Support Structures Medium Medium N/[name] Exudate Amount: Serosanguineous Serous N/[name] Exudate Type: red, brown amber N/[name] Exudate Color: Distinct, outline attached Flat and Intact N/[name] Wound Margin: Medium (34-66%) Small (1-33%) N/[name] Granulation Amount: Pale Red, Pink N/[name] Granulation Quality: Medium (34-66%) Large (67-100%) N/[name] Necrotic Amount: Fat Layer (Subcutaneous Tissue): Yes Fat Layer (Subcutaneous Tissue): Yes N/[name] Exposed Structures: Fascia: No Fascia: No Tendon: No Tendon: No Muscle: No Muscle: No Joint: No Joint: No Bone: No Bone: No Medium (34-66%) None N/[name] Epithelialization: Brenes, Teah [name] ([id number]) [id number].pdf Page 4 of 10 Treatment Notes Electronic Signature(s) Signed: 01/04/2023 11:54:14 AM By: Massie Kluver Entered By: Massie Kluver on 01/03/2023 16:16:39 -------------------------------------------------------------------------------- Multi-Disciplinary Care Plan Details Patient Name: Date of Service: Heather Hitch [name]. 01/03/2023 3:45 PM Medical Record Number: [id number] Patient Account Number: [id number] Date of Birth/Sex: Treating RN: October 22, 1976 (47 y.o. [name], [name] Primary Care Kalai Baca: Raelene Bott Other Clinician: Massie Kluver Referring Chanie Soucek: Treating Rohith Fauth/Extender: Eddie North in  Treatment: 46 Active Inactive Abuse / Safety / Falls / Self Care Management Nursing Diagnoses: History of Falls Potential for falls Potential for injury related to falls Goals: Patient will not develop complications from immobility Date Initiated: 03/17/2022 Date Inactivated: 04/20/2022 Target Resolution Date: 03/16/2022 Goal Status: Met Patient/caregiver will verbalize understanding of skin care regimen Date Initiated: 03/17/2022 Date Inactivated: 09/28/2022 Target Resolution Date: 03/16/2022 Goal Status: Met Patient/caregiver will verbalize/demonstrate measure taken to improve self care Date Initiated: 03/17/2022 Target Resolution Date: 03/16/2022 Goal Status: Active Interventions: Assess fall risk on admission and as needed Provide education on basic hygiene Provide education on personal and home safety Notes: Necrotic Tissue Nursing Diagnoses: Impaired tissue integrity related to necrotic/devitalized tissue Knowledge deficit related to management of necrotic/devitalized tissue Goals: Necrotic/devitalized tissue will be minimized in the wound bed Date Initiated: 03/17/2022 Target Resolution  Date: 10/26/2022 Goal Status: Active Patient/caregiver will verbalize understanding of reason and process for debridement of necrotic tissue Date Initiated: 03/17/2022 Date Inactivated: 09/28/2022 Target Resolution Date: 03/16/2022 Goal Status: Met Interventions: Provide education on necrotic tissue and debridement process ROSETTE, SUTHERLIN [name] ([id number]) [phone].pdf Page 5 of 10 Treatment Activities: Apply topical anesthetic as ordered : 03/16/2022 Excisional debridement : 03/16/2022 Notes: Osteomyelitis Nursing Diagnoses: Infection: osteomyelitis Knowledge deficit related to disease process and management Goals: Patient/caregiver will verbalize understanding of disease process and disease management Date Initiated: 03/17/2022 Target Resolution Date: 03/16/2022 Goal  Status: Active Patient's osteomyelitis will resolve Date Initiated: 03/17/2022 Target Resolution Date: 10/26/2022 Goal Status: Active Signs and symptoms for osteomyelitis will be recognized and promptly addressed Date Initiated: 03/17/2022 Target Resolution Date: 10/26/2022 Goal Status: Active Interventions: Assess for signs and symptoms of osteomyelitis resolution every visit Provide education on osteomyelitis Treatment Activities: Systemic antibiotics : 03/16/2022 Notes: Patient on antibiotics. Pressure Nursing Diagnoses: Knowledge deficit related to management of pressures ulcers Potential for impaired tissue integrity related to pressure, friction, moisture, and shear Goals: Patient will remain free from development of additional pressure ulcers Date Initiated: 11/02/2022 Target Resolution Date: 11/09/2022 Goal Status: Active Patient/caregiver will verbalize risk factors for pressure ulcer development Date Initiated: 11/02/2022 Target Resolution Date: 11/02/2022 Goal Status: Active Patient/caregiver will verbalize understanding of pressure ulcer management Date Initiated: 11/02/2022 Target Resolution Date: 11/02/2022 Goal Status: Active Interventions: Assess: immobility, friction, shearing, incontinence upon admission and as needed Provide education on pressure ulcers Notes: Electronic Signature(s) Signed: 01/04/2023 7:50:42 AM By: Gretta Cool, BSN, RN, CWS, [name] RN, BSN Signed: 01/04/2023 11:54:14 AM By: Massie Kluver Entered By: Massie Kluver on 01/03/2023 17:08:27 Pain Assessment Details -------------------------------------------------------------------------------- [name] ([id number]) [id number] Page 6 of 10 Patient Name: Date of Service: BAYLIE, STARLIPER 01/03/2023 3:45 PM Medical Record Number: [id number] Patient Account Number: [id number] Date of Birth/Sex: Treating RN: 03-13-1976 (46 y.o. [name] Primary Care Shamila Lerch: Raelene Bott Other Clinician: Massie Kluver Referring Clois Montavon: Treating Lyndsee Casa/Extender: Eddie North in Treatment: 38 Active Problems Location of Pain Severity and Description of Pain Patient Has Paino No Site Locations Pain Management and Medication Current Pain Management: Electronic Signature(s) Signed: 01/04/2023 7:50:42 AM By: Gretta Cool, BSN, RN, CWS, [name] RN, BSN Signed: 01/04/2023 11:54:14 AM By: Massie Kluver Entered By: Massie Kluver on 01/03/2023 15:54:21 -------------------------------------------------------------------------------- Patient/Caregiver Education Details Patient Name: Date of Service: Michel Harrow 3/12/2024andnbsp3:45 PM Medical Record Number: [id number] Patient Account Number: [id number] Date of Birth/Gender: Treating RN: 1976/03/25 (47 y.o. [name] Primary Care Physician: Raelene Bott Other Clinician: Massie Kluver Referring Physician: Treating Physician/Extender: Eddie North in Treatment: 24 Education Assessment Education Provided To: Patient Education Topics Provided Wound/Skin Impairment: Handouts: Other: continue wound care as directed Methods: Explain/Verbal Responses: State content correctly EVANGELIA, SEISER [name] ([id number]) [phone].pdf Page 7 of 10 Electronic Signature(s) Signed: 01/04/2023 11:54:14 AM By: Massie Kluver Entered By: Massie Kluver on 01/03/2023 17:08:48 -------------------------------------------------------------------------------- Wound Assessment Details Patient Name: Date of Service: RAMAN, [name] 01/03/2023 3:45 PM Medical Record Number: [id number] Patient Account Number: [id number] Date of Birth/Sex: Treating RN: 1976-01-20 (47 y.o. [name] Primary Care Shadee Montoya: Raelene Bott Other Clinician: Massie Kluver Referring Brenyn Petrey: Treating Kollyns Mickelson/Extender: Linna Hoff Weeks in Treatment: 41 Wound  Status Wound Number: 11 Primary Open Surgical Wound Etiology: Wound Location: Right, Plantar Foot Wound Open Wounding Event: Surgical Injury Status: Date Acquired: 12/01/2021 Comorbid Chronic sinus problems/congestion, Middle ear problems, Anemia, Weeks Of Treatment: 41 History:  Chronic Obstructive Pulmonary Disease (COPD), Congestive Heart Clustered Wound: No Failure, Type II Diabetes, End Stage Renal Disease, History of pressure wounds, Neuropathy Photos Wound Measurements Length: (cm) 0.8 Width: (cm) 0.5 Depth: (cm) 0.4 Area: (cm) 0.314 Volume: (cm) 0.126 % Reduction in Area: 83% % Reduction in Volume: 92.4% Epithelialization: Medium (34-66%) Tunneling: No Undermining: No Wound Description Classification: Full Thickness Without Exposed Suppo Wound Margin: Distinct, outline attached Exudate Amount: Medium Exudate Type: Serosanguineous Exudate Color: red, brown rt Structures Foul Odor After Cleansing: No Slough/Fibrino Yes Wound Bed Granulation Amount: Medium (34-66%) Exposed Structure Granulation Quality: Pale Fascia Exposed: No Necrotic Amount: Medium (34-66%) Fat Layer (Subcutaneous Tissue) Exposed: Yes Tendon Exposed: No Muscle Exposed: No Joint Exposed: No Bone Exposed: No Fiola, Afra [name] ([id number] Page 8 of 10 Treatment Notes Wound #11 (Foot) Wound Laterality: Plantar, Right Cleanser Wound Cleanser Discharge Instruction: Wash your hands with soap and water. Remove old dressing, discard into plastic bag and place into trash. Cleanse the wound with Wound Cleanser prior to applying [name] clean dressing using gauze sponges, not tissues or cotton balls. Do not scrub or use excessive force. Pat dry using gauze sponges, not tissue or cotton balls. Peri-Wound Care Topical Gentamicin Discharge Instruction: Apply as directed by Sayaka Hoeppner. in clinic Primary Dressing Aquacel Extra Hydrofiber Dressing, 2x2 (in/in) Secondary  Dressing ABD Pad 5x9 (in/in) Discharge Instruction: Cover with ABD pad Secured With Medipore T - 36M Medipore H Soft Cloth Surgical T ape ape, 2x2 (in/yd) Kerlix Roll Sterile or Non-Sterile 6-ply 4.5x4 (yd/yd) Discharge Instruction: Apply Kerlix as directed Compression Wrap Compression Stockings Add-Ons Electronic Signature(s) Signed: 01/04/2023 7:50:42 AM By: Gretta Cool, BSN, RN, CWS, [name] RN, BSN Signed: 01/04/2023 11:54:14 AM By: Massie Kluver Entered By: Massie Kluver on 01/03/2023 16:15:18 -------------------------------------------------------------------------------- Wound Assessment Details Patient Name: Date of Service: Heather Hitch [name]. 01/03/2023 3:45 PM Medical Record Number: [id number] Patient Account Number: [id number] Date of Birth/Sex: Treating RN: [birth year] (47 y.o. [name], [name] Primary Care Mateya Torti: Raelene Bott Other Clinician: Massie Kluver Referring Masha Orbach: Treating Payzlee Ryder/Extender: Eddie North in Treatment: 41 Wound Status Wound Number: 12 Primary Diabetic Wound/Ulcer of the Lower Extremity Etiology: Wound Location: Left, Medial, Plantar Foot Wound Open Wounding Event: Pressure Injury Status: Date Acquired: 03/16/2020 Comorbid Chronic sinus problems/congestion, Middle ear problems, Anemia, Weeks Of Treatment: 41 History: Chronic Obstructive Pulmonary Disease (COPD), Congestive Heart Clustered Wound: No Failure, Type II Diabetes, End Stage Renal Disease, History of pressure wounds, Neuropathy Photos MARQUES, CAMILLO [name] ([id number]) [id number] Page 9 of 10 Wound Measurements Length: (cm) 1 Width: (cm) 0.9 Depth: (cm) 0.1 Area: (cm) 0.707 Volume: (cm) 0.071 % Reduction in Area: 85% % Reduction in Volume: 92.5% Epithelialization: None Wound Description Classification: Grade 3 Wound Margin: Flat and Intact Exudate Amount: Medium Exudate Type: Serous Exudate Color: amber Foul Odor After  Cleansing: No Slough/Fibrino No Wound Bed Granulation Amount: Small (1-33%) Exposed Structure Granulation Quality: Red, Pink Fascia Exposed: No Necrotic Amount: Large (67-100%) Fat Layer (Subcutaneous Tissue) Exposed: Yes Necrotic Quality: Adherent Slough Tendon Exposed: No Muscle Exposed: No Joint Exposed: No Bone Exposed: No Treatment Notes Wound #12 (Foot) Wound Laterality: Plantar, Left, Medial Cleanser Wound Cleanser Discharge Instruction: Wash your hands with soap and water. Remove old dressing, discard into plastic bag and place into trash. Cleanse the wound with Wound Cleanser prior to applying [name] clean dressing using gauze sponges, not tissues or cotton balls. Do not scrub or use excessive force. Pat dry using gauze sponges, not tissue or cotton balls. Peri-Wound  Care Topical Gentamicin Discharge Instruction: Apply as directed by Jarom Govan. in clinic Primary Dressing Hydrofera Blue Ready Transfer Foam, 2.5x2.5 (in/in) Discharge Instruction: Apply Hydrofera Blue Ready to wound bed as directed [place] 4 1/4x 4 1/4 (in/in) Discharge Instruction: Apply to periwound Secondary Dressing ABD Pad 5x9 (in/in) Discharge Instruction: Cover with ABD pad Secured With [place] H Soft Cloth Surgical T ape ape, 2x2 (in/yd) Kerlix Roll Sterile or Non-Sterile 6-ply 4.5x4 (yd/yd) Discharge Instruction: Apply Kerlix as directed Compression Wrap Compression Stockings Add-Ons Electronic Signature(s) Signed: 01/04/2023 7:50:42 AM By: Gretta Cool, BSN, RN, CWS, [name] RN, BSN [place], Cristel [name] ([id number]) [phone].pdf Page 10 of 10 Signed: 01/04/2023 11:54:14 AM By: Massie Kluver Entered By: Massie Kluver on 01/03/2023 16:16:09 -------------------------------------------------------------------------------- Vitals Details Patient Name: Date of Service: Heather Hitch [name]. 01/03/2023 3:45 PM Medical Record Number: [id number] Patient Account Number:  [id number] Date of Birth/Sex: Treating RN: [birth year] (47 y.o. [name], [name] Primary Care Jourden Gilson: Raelene Bott Other Clinician: Massie Kluver Referring Ihsan Nomura: Treating Anorah Trias/Extender: Eddie North in Treatment: 3 Vital Signs Time Taken: 15:51 Temperature (F): 98.5 Height (in): 69 Pulse (bpm): 80 Weight (lbs): 178 Respiratory Rate (breaths/min): 16 Body Mass Index (BMI): 26.3 Blood Pressure (mmHg): 151/101 Reference Range: 80 - 120 mg / dl Notes Patient c/o headache, she did not take her bp medication x 2 days Willona Phariss informed Electronic Signature(s) Signed: 01/04/2023 11:54:14 AM By: Massie Kluver Entered By: Massie Kluver on 01/03/2023 15:54:11

## 2023-01-04 NOTE — Progress Notes (Signed)
NICOLINE, BODE [name] ([id number]) [id number] Page 1 of 22 Visit Report for 01/03/2023 Chief Complaint Document Details Patient Name: Date of Service: [name], [name] 01/03/2023 3:45 PM Medical Record Number: [id number] Patient Account Number: [id number] Date of Birth/Sex: Treating RN: 1976/10/12 (47 y.o. [name] Primary Care Provider: Raelene Bott Other Clinician: Massie Kluver Referring Provider: Treating Provider/Extender: Eddie North in Treatment: 46 Information Obtained from: Patient Chief Complaint 03/19/2021; patient referred by Dr. Luana Shu who has been looking after her left foot for quite [name] period of time for review of [name] nonhealing area in the left midfoot 03/12/2022; bilateral feet wounds and right lower extremity wound. Electronic Signature(s) Signed: 01/03/2023 4:50:32 PM By: Kalman Shan DO Entered By: Kalman Shan on 01/03/2023 16:30:06 -------------------------------------------------------------------------------- Debridement Details Patient Name: Date of Service: Heather Hitch [name]. 01/03/2023 3:45 PM Medical Record Number: [id number] Patient Account Number: [id number] Date of Birth/Sex: Treating RN: 04-Nov-1975 (47 y.o. [name], [name] Primary Care Provider: Raelene Bott Other Clinician: Massie Kluver Referring Provider: Treating Provider/Extender: Eddie North in Treatment: 41 Debridement Performed for Assessment: Wound #12 Left,Medial,Plantar Foot Performed By: Physician Kalman Shan, MD Debridement Type: Debridement Severity of Tissue Pre Debridement: Fat layer exposed Level of Consciousness (Pre-procedure): Awake and Alert Pre-procedure Verification/Time Out Yes - 04:12 Taken: Start Time: 04:12 T Area Debrided (L x W): otal 5 (cm) x 5 (cm) = 25 (cm) Tissue and other material debrided: Viable, Non-Viable, Callus, Slough, Subcutaneous, Slough Level: Skin/Subcutaneous  Tissue Debridement Description: Excisional Instrument: Curette Bleeding: Minimum Hemostasis Achieved: Pressure Response to Treatment: Procedure was tolerated well Level of Consciousness (Post- Awake and Alert procedure): [name], [name] ([id number]) [id number] Page 2 of 22 Post Debridement Measurements of Total Wound Length: (cm) 1 Width: (cm) 0.9 Depth: (cm) 0.1 Volume: (cm) 0.071 Character of Wound/Ulcer Post Debridement: Stable Severity of Tissue Post Debridement: Fat layer exposed Post Procedure Diagnosis Same as Pre-procedure Electronic Signature(s) Signed: 01/03/2023 4:50:32 PM By: Kalman Shan DO Signed: 01/04/2023 7:50:42 AM By: Gretta Cool, BSN, RN, CWS, [name] RN, BSN Signed: 01/04/2023 11:54:14 AM By: Massie Kluver Entered By: Massie Kluver on 01/03/2023 16:24:03 -------------------------------------------------------------------------------- Debridement Details Patient Name: Date of Service: Heather Hitch [name]. 01/03/2023 3:45 PM Medical Record Number: [id number] Patient Account Number: [id number] Date of Birth/Sex: Treating RN: [birth year] (47 y.o. [name] Primary Care Provider: Raelene Bott Other Clinician: Massie Kluver Referring Provider: Treating Provider/Extender: Eddie North in Treatment: 41 Debridement Performed for Assessment: Wound #11 Right,Plantar Foot Performed By: Physician Kalman Shan, MD Debridement Type: Debridement Level of Consciousness (Pre-procedure): Awake and Alert Pre-procedure Verification/Time Out Yes - 04:24 Taken: Start Time: 04:24 T Area Debrided (L x W): otal 0.8 (cm) x 0.5 (cm) = 0.4 (cm) Tissue and other material debrided: Viable, Non-Viable, Slough, Slough Level: Non-Viable Tissue Debridement Description: Selective/Open Wound Instrument: Curette Bleeding: Minimum Hemostasis Achieved: Pressure Response to Treatment: Procedure was tolerated well Level of  Consciousness (Post- Awake and Alert procedure): Post Debridement Measurements of Total Wound Length: (cm) 0.8 Width: (cm) 0.5 Depth: (cm) 0.4 Volume: (cm) 0.126 Character of Wound/Ulcer Post Debridement: Stable Post Procedure Diagnosis Same as Pre-procedure Electronic Signature(s) Signed: 01/03/2023 4:50:32 PM By: Kalman Shan DO Signed: 01/04/2023 7:50:42 AM By: Gretta Cool, BSN, RN, CWS, [name] RN, BSN Signed: 01/04/2023 11:54:14 AM By: Massie Kluver Entered By: Massie Kluver on 01/03/2023 16:25:42 [name] ([id number]) [id number] Page 3 of 22 -------------------------------------------------------------------------------- HPI Details Patient Name: Date of Service: [name], [name] 01/03/2023 3:45 PM Medical  Record Number: [id number] Patient Account Number: [id number] Date of Birth/Sex: Treating RN: 1975-12-15 (47 y.o. [name] Primary Care Provider: Raelene Bott Other Clinician: Massie Kluver Referring Provider: Treating Provider/Extender: Eddie North in Treatment: 67 History of Present Illness HPI Description: 01/18/18-She is here for initial evaluation of the left great toe ulcer. She is [name] poor historian in regards to timeframe in detail. She states approximately 4 weeks ago she lacerated her toe on something in the house. She followed up with her primary care who placed her on Bactrim and ultimately [name] second dose of Bactrim prior to coming to wound clinic. She states she has been treating the toe with peroxide, Betadine and [name] Band-Aid. She did not check her blood sugar this morning but checked it yesterday morning it was 327; she is unaware of [name] recent A1c and there are no current records. She saw Dr. she would've orthopedics last week for an old injury to the left ankle, she states he did not see her toe, nor did she bring it to his attention. She smokes approximately 1 pack cigarettes [name] day. Her social  situation is concerning, she arrives this morning with her mother who appears extremely intoxicated/under the influence; her mother was asked to leave the room and be monitored by the patient's grandmother. The patient's aunt then accompanied the patient and the room throughout the rest of the appointment. We had [name] lengthy discussion regarding the deleterious effects of uncontrolled hyperglycemia and smoking as it relates to wound healing and overall health. She was strongly encouraged to decrease her smoking and get her diabetes under better control. She states she is currently on [name] diet and has cut down her [facility] consumption. The left toe is erythematous, macerated and slightly edematous with malodor present. The edema in her left foot is below her baseline, there is no erythema streaking. We will treat her with Santyl, doxycycline; we have ordered and xray, culture and provided [name] Peg assist surgical shoe and cultured the wound. 01/25/18-She is here in follow-up evaluation for [name] left great toe ulcer and presents with an abscess to her suprapubic area. She states her blood sugars remain elevated, feeling "sick" and if levels are below 250, but she is trying. She has made no attempt to decrease her smoking stating that we "can't take away her food in her cigarettes". She has been compliant with offloading using the PEG assist you. She is using Santyl daily. the culture obtained last week grew staph aureus and Enterococcus faecalis; continues on the doxycycline and Augmentin was added on Monday. The suprapubic area has erythema, no femoral variation, purple discoloration, minimal induration, was accessed with [name] cotton tip applicator with sanguinopurulent drainage, this was cultured, I suspect the current antibiotic treatment will cover and we will not add anything to her current treatment plan. She was advised to go to urgent care or ER with any change in redness, induration or fever. 02/01/18-She  is here in follow-up evaluation for left great toe ulcers and [name] new abdominal abscess from last week. She was able to use packing until earlier this week, where she "forgot it was there". She states she was feeling ill with GI symptoms last week and was not taking her antibiotic. She states her glucose levels have been predominantly less than 200, with occasional levels between 200-250. She thinks this was contributing to her GI symptoms as they have resolved without intervention. There continues to be significant laceration to left toe, otherwise  it clinically looks stable/improved. There is now less superficial opening to the lateral aspect of the great toe that was residual blister. We will transition to [facility] to all wounds, she will continue her Augmentin. If there is no change or deterioration next week for reculture. 02/08/18-She is here in follow-up evaluation for left great toe ulcer and abdominal ulcer. There is an improvement in both wounds. She has been wrapping her left toe with coban, not by our direction, which has created an area of discoloration to the medial aspect; she has been advised to NOT use coban secondary to her neuropathy. She states her glucose levels have been high over this last week ranging from 200-350, she continues to smoke. She admits to being less compliant with her offloading shoe. We will continue with same treatment plan and she will follow-up next week. 02/15/18-She is here in follow-up evaluation for left great toe ulcer and abdominal ulcer. The abdominal ulcer is epithelialized. The left great toe ulcer is improved and all injury from last week using the Coban wrap is resolved, the lateral ulcer is healed. She admits to noncompliance with wearing offloading shoe and admits to glucose levels being greater than 300 most of the week. She continues to smoke and expresses no desire to quit. There is one area medially that probes deeper than it has historically,  erythema to the toe and dorsal foot has consistently waxed and waned. There is no overt signs of cellulitis or infection but we will culture the wound for any occult infection given the new area of depth and erythema. We will hold off on sensitivities for initiation of antibiotic therapy. 02/22/18-She is here in follow up evaluation for left great toe ulcer. There is overall significant improvement in both wound appearance, erythema and edema with changes made last week. She was not initiated on antibiotic therapy. Culture obtained last week showed oxacillin sensitive staph aureus, sensitive to clindamycin. Clindamycin has been called into the pharmacy but she has been instructed to hold off on initiation secondary to overall clinical improvement and her history of antibiotic intolerance. She has been instructed to contact the clinic with any noted changes/deterioration and the wound, erythema, edema and/or pain. She will follow-up next week. She continues to smoke and her glucose levels remain elevated >250; she admits to compliance with offloading shoe 03/01/18 on evaluation today patient appears to be doing fairly well in regard to her left first toe ulcer. She has been tolerating the dressing changes with the [facility] Dressing without complication and overall this has definitely showed signs of improvement according to records as well is what the patient tells me today. I'm very pleased in that regard. She is having no pain today 03/08/18 She is here for follow up evaluation of [name] left great toe ulcer. She remains non-compliant with glucose control and smoking cessation; glucose levels consistently >200. She states that she got new shoe inserts/peg assist. She admits to compliance with offloading. Since my last evaluation there is significant improvement. We will switch to prisma at this time and she will follow up next week. She is noted to be tachycardic at this appointment, heart rate 120s;  she has [name] history of heart rate 70-130 according to our records. She admits to extreme agitation r/t personal issues; she was advised to monitor her heartrate and contact her physician if it does not return to [name] more normal range (<100). She takes cardizem twice daily. 03/15/18-She is here in follow-up evaluation for left  great toe ulcer. She remains noncompliant with glucose control and smoking cessation. She admits to compliance with wearing offloading shoe. The ulcer is improved/stable and we will continue with the same treatment plan and she will follow-up next week 03/22/18-She is here for evaluation for left great toe ulcer. There continues to be significant improvement despite recurrent hyperglycemia (over 500 yesterday) and she continues to smoke. She has been compliant with offloading and we will continue with same treatment plan and she will follow-up next week. 03/29/18-She is here for evaluation for left great toe ulcer. Despite continuing to smoke and uncontrolled diabetes she continues to improve. She is compliant with offloading shoe. We will continue with the same treatment plan and she will follow-up next week 04/05/18- She is here in follow up evaluation for [name] left great toe ulcer; she presents with small pustule to left fifth toe (resembles ant bite). She admits to compliance with wearing offloading shoe; continues to smoke or have uncontrolled blood glucose control. There is more callus than usual with evidence of bleeding; she denies known trauma. 04/12/18-She is here for evaluation of left great toe ulcer. Despite noncompliance with glycemic control and smoking she continues to make improvement. She continues to wear offloading shoe. The pustule, that was identified last week, to the left fifth toe is resolved. She will follow-up in 2 weeks 05/03/18-she is seen in follow-up evaluation for [name] left great toe ulcer. She is compliant with offloading, otherwise noncompliant with glycemic  control and [name], Annarose [name] ([id number]) [id number] Page 4 of 22 smoking. She has plateaued and there is minimal improvement noted. We will transition to [facility], replaced the insert to her surgical shoe and she will follow-up in one week 05/10/18- She is here in follow up evaluation for [name] left great toe ulcer. It appears stable despite measurement change. We will continue with same treatment plan and follow up next week. 05/24/18-She is seen in follow-up evaluation for [name] left great toe ulcer. She remains compliant with offloading, has made significant improvement in her diet, decreasing the amount of sugar/soda. She said her recent A1c was 10.9 which is lower than. She did see [name] diabetic nutritionist/educator yesterday. She continues to smoke. We will continue with the same treatment plan and she'll follow-up next week. 05/31/18- She is seen in follow-up evaluation for left great toe ulcer. She continues to remain compliant with offloading, continues to make improvement in her diet, increasing her water and decreasing the amount of sugar/soda. She does continue to smoke with no desire to quit. We will apply Prisma to the depth and Hydrofera Blue over. We have not received insurance authorization for oasis. She will follow up next week. 06/07/18-She is seen in follow-up evaluation for left great toe ulcer. It has stalled according to today's measurements although base appears stable. She says she saw [name] diabetic educator yesterday; her average blood sugars are less than 300 which is an improvement for her. She continues to smoke and states "that's my next step" She continues with water over soda. We will order for xray, culture and reinstate ace wrap compression prior to placing apligraf for next week. She is voicing no complaints or concerns. Her dressing will change to iodoflex over the next week in preparation for apligraf. 06/14/18-She is seen in follow-up evaluation  for left great toe ulcer. Plain film x-ray performed last week was negative for osteomyelitis. Wound culture obtained last week grew strep B and OSSA; she is initiated on keflex and cefdinir today;  there is erythema to the toe which could be from ace wrap compression, she has [name] history of wrapping too tight and has has been encouraged to maintain ace wraps that we place today. We will hold off on application of apligraf today, will apply next week after antibiotic therapy has been initiated. She admits today that she has resumed taking [name] shower with her foot/toe submerged in water, she has been reminded to keep foot/toe out of the bath water. She will be seen in follow up next week 06/21/18-she is seen in follow-up evaluation for left great toe ulcer. She is tolerating antibiotic therapy with no GI disturbance. The wound is stable. Apligraf was applied today. She has been decreasing her smoking, only had 4 cigarettes yesterday and 1 today. She continues being more compliant in diabetic diet. She will follow-up next week for evaluation of site, if stable will remove at 2 weeks. 06/28/18- She is here in follow up evalution. Apligraf was placed last week, she states the dressing fell off on Tuesday and she was dressing with hydrofera blue. She is healed and will be discharged from the clinic today. She has been instructed to continue with smoking cessation, continue monitoring glucose levels, offloading for an additional 4 weeks and continue with hydrofera blue for additional two weeks for any possible microscopic opening. Readmission: 08/07/18 on evaluation today patient presents for reevaluation concerning the ulcer of her right great toe. She was previously discharged on 06/28/18 healed. Nonetheless she states that this began to show signs of drainage she subsequently went to her primary care provider. Subsequently an x-ray was performed on 08/01/18 which was negative. The patient was also placed on  antibiotics at that time. Fortunately they should have been effective for the infection. Nonetheless she's been experiencing some improvement but still has [name] lot of drainage coming from the wound itself. 08/14/18 on evaluation today patient's wound actually does show signs of improvement in regard to the erythema at this point. She has completed the antibiotics. With that being said we did discuss the possibility of placing her in [name] total contact cast as of today although I think that I may want to give this just [name] little bit more time to ensure nothing recurrence as far as her infection is concerned. I do not want to put in the cast and risk infection at that time if things are not completely resolved. With that being said she is gonna require some debridement today. 08/21/18 on evaluation today patient actually appears to be doing okay in regard to her toe ulcer. She's been tolerating the dressing changes without complication. With that being said it does appear that she is ready and in fact I think it's appropriate for [place] to go ahead and initiate the total contact cast today. Nonetheless she will require some sharp debridement to prepare the wound for application. Overall I feel like things have been progressing well but we do need to do something to get this to close more readily. 08/24/18 patient seen today for reevaluation after having had the total contact cast applied on Tuesday. She seems to have done very well the wound appears to be doing great and overall I'm pleased with the progress that she's made. There were no abnormal areas of rubbing from the cast on her lower extremity. 08/30/18 on evaluation today patient actually appears to be completely healed in regard to her plantar toe ulcer. She tells me at this point she's been having [name] lot of issues with  the cast. She almost fell [name] couple of times the state shall the step of her dog [name] couple times as well. This is been [name] very frustrating  process for her other nonetheless she has completely healed the wound which is excellent news. Overall there does not appear to be the evidence of infection at this time which is great news. 09/11/18 evaluation today patient presents for follow-up concerning her great toe ulcer on the left which has unfortunately reopened since I last saw her which was only [name] couple of weeks ago. Unfortunately she was not able to get in to get the shoe and potentially the AFO that's gonna be necessary due to her left foot drop. She continues with offloading shoe but this is not enough to prevent her from reopening it appears. When we last had her in the total contact cast she did well from [name] healing standpoint but unfortunately the wound reopened as soon as she came out of the cast within just [name] couple of weeks. 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 This is also part of the reason why she falls on [name] regular basis. I just do not believe that is gonna be safe for [place] to reinitiate the total contact cast as last time we had this on she fell 3 times one week which is definitely not normal for her. 09/18/18 upon evaluation today the patient actually appears to be doing about the same in regard to her toe ulcer. She did not contact [facility] as I asked her to even though I had given her the prescription. In fact she actually states that she has no idea where the prescription is. She did apparently call [facility] and they told her that all she needed to do was bring the prescription in order to be able to be seen and work on getting the AFO for her left foot. With all that being said she still does not have an appointment and I'm not sure were things stand that regard. I will give her [name] new prescription today in order to contact  them to get this set up. 09/25/18 on evaluation today patient actually appears to be doing about the same in regard to her toes ulcer. She does have [name] small areas which seems to have [name] lot of callous buildup around the edge of the wound which is going to need sharp debridement today. She still is waiting to be scheduled for evaluation with [facility] for possibility of an AFO. She states there supposed to call her tomorrow to get this set up. Unfortunately it does appear that her foot specifically the toe area is showing signs of erythema. There does not appear to be any systemic infection which is in these good news. 10/02/18 on evaluation today patient actually appears to be doing about the same in regard to her toe ulcer. This really has not done too well although it's not significantly larger it's also not significantly smaller. She has been tolerating the dressing changes without complication. She actually has her appointment with [facility] and [place] tomorrow to hopefully be measured for obtaining and AFO splint. I think this would be helpful preventing this from reoccurring. We had contemplated starting the cast this week although to be honest I am reluctant to do  that as she's been having nausea, vomiting, and seizure activity over the past three days. She has [name] history of seizures and have been told is nothing that can be done for these. With that being said I do believe that along with the seizures have the nausea vomiting which upon further questioning doesn't seem to be the normal for her and makes me concerned for the possibility of infection or something else going on. I discussed this with the patient and her mother during the office visit today. I do not feel the wound is effective but maybe something else. The responses this was "this just happens to her at times and we don't know why". They did not seem to be interested in going to the hospital to have this checked out further. 10/09/18  on evaluation today patient presents for follow-up concerning her ongoing toe ulcer. She has been tolerating the dressing changes without complication. Fortunately there does not appear to be any evidence of infection which is great news however I do think that the patient would benefit from going ahead for with the total contact cast. She's actually in [name] wheelchair today she tells me that she will use her walker if we initiate the cast. I was very specific about the fact that if we were gonna do the cast I wanted to make sure that she was using the walker in order to prevent any falls. She tells me she does not have stairs that she has to traverse on [name] regular basis at her home. She has not had any seizures since last week again that something that happens to her often she tells me she did talk to [employer] and they said that it may take up to three weeks to get the brace approved for her. Hopefully that will not take that long but nonetheless in the meantime I do think the cast could be of benefit. 10/12/18 on evaluation today patient appears to be doing rather well in regard to her toe ulcer. It's just been [name] few days and already this is significantly improved both as far as overall appearance and size. Fortunately there's no sign of infection. She is here for her first obligatory cast change. [name], [name] ([id number]) [id number] Page 5 of 22 10/19/18 Seen today for follow up and management of left great toe ulcer. Wound continues to show improvement. Noted small open area with seroussang drainage with palpation. Denies any increased pain or recent fevers during visit. She will continue calcium alginate with offloading shoe. Denies any questions or concerns during visit. 10/26/18 on evaluation today patient appears to be doing about the same as when I last saw her in regard to her wound bed. Fortunately there does not appear to be any signs of infection. Unfortunately she  continues to have [name] breakdown in regard to the toe region any time that she is not in the cast. It takes almost no time at all for this to happen. Nonetheless she still has not heard anything from the brace being made by [employer] as to when exactly this will be available to her. Fortunately there is no signs of infection at this time. 10/30/18 on evaluation today patient presents for application of the total contact cast as we just received him this morning. Fortunately we are gonna be able to apply this to her today which is great news. She continues to have no significant pain which is good news. Overall I do feel like things have been improving while she  was the cast is when she doesn't have [name] cast that things get worse. She still has not really heard anything from [place] regarding her brace. 11/02/18 upon evaluation today patient's wound already appears to be doing significantly better which is good news. Fortunately there does not appear to be any signs of infection also good news. Overall I do think the total contact cast as before is helping to heal this area unfortunately it's just not gonna likely keep the area closed and healed without her getting her brace at least. Again the foot drop is [name] significant issue for her. 11/09/18 on evaluation today patient appears to be doing excellent in regard to her toe ulcer which in fact is completely healed. Fortunately we finally got the situation squared away with the paperwork which was needed to proceed with getting her brace approved by Medicaid. I have filled that out unfortunately that information has been sent to the orthopedic office that I worked at 2 1/2 years ago and not tired Current wound care measures. Fortunately she seems to be doing very well at this time. 11/23/18 on evaluation today patient appears to be doing More Poorly Compared to Last Time I Saw Her. At [facility] She Had Completely Healed. Currently she is continuing to have issues  with reopening. She states that she just found out that the brace was approved through Medicaid now she just has to go get measured in order to have this fitted for her and then made. Subsequently she does not have an appointment for this yet that is going to complicate things we obviously cannot put her back in the cast if we do not have everything measured because they're not gonna be able to measure her foot while she is in the cast. 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 Obviously this is unfortunate and does have me somewhat worried as well. 11/30/18 on evaluation today patient's toe ulcer actually appears to be doing fairly well. The good news is she will be getting her brace in the shoes next week on Wednesday. Hopefully we will be able to get this to heal without having to go back in the cast however she may need the cast in order to get the wound completely heal and then go from there. Fortunately there's no signs of infection at this time. 12/07/18 on evaluation today patient fortunately did receive her brace and she states she could tell this definitely makes her walk better. With that being said she's been having issues with her toe where she noticed yesterday there was [name] lot of tissue that was loosing off this appears to be much larger than what it was previous. She also states that her leg has been read putting much across the top of her foot just about the ankle although this seems to be receiving somewhat. The total area is still red and appears to be someone infected as best I can tell. She is previously taken Bactrim and that may be [name] good option for her today as well. We are gonna see what I wound culture shows as well and I think that this is definitely appropriate. With that being said outside of the culture I still need to initiate something in the interim and that's what I'm gonna  go ahead and select Bactrim is [name] good option for her. 12/14/18 on evaluation today patient appears to  be doing better in regard to her left great toe ulcer as compared to last week's evaluation. There's still some erythema although this is significantly improved which is excellent news. Overall I do believe that she is making good progress is still gonna take some time before she is where I would like her to be from the standpoint of being able to place her back into the total contact cast. Hopefully we will be where we need to be by next week. 12/21/18 on evaluation today patient actually appears to be doing poorly in regard to her toe ulcer. She's been tolerating the dressing changes without complication. Fortunately there's no signs of systemic infection although she does have [name] lot of drainage from the toe ulcer and this does seem to be causing some issues at this point. She does have erythema on the distal portion of her toe that appears to be likely cellulitis. 12/28/18 on evaluation today patient actually appears to be doing [name] little better in my pinion in regard to her toe ulcer. With that being said she still does have some evidence of infection at this time and for her culture she had both [name] coli as well as enterococcus as organisms noted on evaluation. For that reason I think that though the Keflex likely has treated the [name] coli rather well this has really done nothing for the enterococcus. We are going to have to initiate treatment for this specifically. 01/04/19 on evaluation today patient's toe actually appears to be doing better from the standpoint of infection. She currently would like to see about putting the cash back on I think that this is appropriate as long as she takes care of it and keeps it from getting wet. She is gonna have some drainage we can definitely pass this up with Drawtex and alginate to try to prevent as much drainage as possible from causing the problems. With that  being said I do want to at least try her with the cast between now and Tuesday. If there any issues we can't continue to use it then I will discontinue the use of the cast at that point. 01/08/19 on evaluation today patient actually appears to be doing very well as far as her foot ulcer specifically the great toe on the left is concerned. She did have an area of rubbing on the medial aspect of her left ankle which again is from the cast. Fortunately there's no signs of infection at this point in this appears to be [name] very slight skin breakdown. The patient tells me she felt it rubbing but didn't think it was that bad. Fortunately there is no signs of active infection at this time which is good news. No fevers, chills, nausea, or vomiting noted at this time. 01/15/19 on evaluation today patient actually appears to be doing well in regard to her toe ulcer. Again as previous she seems to do well and she has the cast on which indicates to me that during the time she doesn't have [name] cast on she's putting way too much pressure on this region. Obviously I think that's gonna be an issue as with the current national emergency concerning the Covid-19 Virus it has been recommended that we discontinue the use of total contact casting by the chief medical officer of our company, Dr. Simona Huh. The reasoning is that if [name] patient becomes sick and cannot come into have the cast removed they could not just leave this on for an additional two weeks. Obviously the  hospitals also do not want to receive patient's who are sick into the emergency department to potentially contaminate the region and spread the Covid-19 Virus among other sick individuals within the hospital system. Therefore at this point we are suspending the use of total contact cast until the current emergency subsides. This was all discussed with the patient today as well. 01/22/19 on evaluation today patient's wound on her left great toe appears to be doing  slightly worse than previously noted last week. She tells me that she has been on this quite [name] bit in fact she tells me she's been awake for 38 straight hours. This is due to the fact that she's having to care for grandparents because nobody else will. She has been taking care of them for five the last seven days since I've seen her they both have dementia his is from [name] stroke and her grandmother's was progressive. Nonetheless she states even her mom who knows her condition and situation has only help two of those days to take care of them she's been taking care of the rest. Fortunately there does not appear to be any signs of active infection in regard to her toe at this point although obviously it doesn't look as good as it did previous. I think this is directly related to her not taking off the pressure and friction by way of taking things easy. Though I completely understand what's going on. 01/29/19 on evaluation today patient's tools are actually appears to be showing some signs of improvement today compared to last week's evaluation as far as not necessarily the overall size of the wound but the fact that she has some new skin growth in between the two ends of the wound opening. Overall I feel like she has done well she states that she had [name] family member give her what sounds to be [name] CAM walker boot which has been helpful as well. 02/05/19 on evaluation today patient's wound bed actually appears to be doing significantly better in regard to her overall appearance of the size of the wound. With that being said she is still having an issue with offloading efficiently enough to get this to close. Apparently there is some signs of infection at this point as well unfortunately. Previously she's done well of Augmentin I really do not see anything that needs to be culture currently but there theme and cellulitis of the foot that I'm seeing I'm gonna go ahead and place her on an antibiotic today to try to  help clear this up. 02/12/2019 on evaluation today patient actually appears to be doing poorly in regard to her overall wound status. She tells me she has been using her offloading shoe but actually comes in today wearing her tennis shoe with the AFO brace. Again as I previously discussed with her this is really not sufficient to allow the area to heal appropriately. Nonetheless she continues to be somewhat noncompliant and I do wonder based on what she has told my nurse in the past as to [name], [name] ([id number]) [id number] Page 6 of 22 whether or not [name] good portion of this noncompliance may be recreational drug and alcohol related. She has had [name] history of heroin overdose and this was fairly recently in the past couple of months that have been seeing her. Nonetheless overall I feel like her wound looks significantly worse today compared to what it was previous. She still has significant erythema despite the Augmentin I am not sure that this  is an appropriate medication for her infection I am also concerned that the infection may have gone down into her bone. 02/19/19 on evaluation today patient actually appears to be doing about the same in regard to her toe ulcer. Unfortunately she continues to show signs of bone exposure and infection at this point. There does not appear to be any evidence of worsening of the infection but I'm also not really sure that it's getting significantly better. She is on the Augmentin which should be sufficient for the Staphylococcus aureus infection that she has at this point. With that being said she may need IV antibiotics to more appropriately treat this. We did have [name] discussion today about hyperbaric option therapy. 02/28/19 on evaluation today patient actually appears to be doing much worse in regard to the wound on her left great toe as compared to even my previous evaluation last week. Unfortunately this seems to be training in [name] pretty  poor direction. Her toe was actually now starting to angle laterally and I can actually see the entire joint area of the proximal portion of the digit where is the distal portion of the digit again is no longer even in contact with the joint line. Unfortunately there's [name] lot more necrotic tissue around the edge and the toe appears to be showing signs of becoming gangrenous in my pinion. I'm very concerned about were things stand at this point. She did see infectious disease and they are planning to send in [name] prescription for Sivextro for her and apparently this has been approved. With that being said I don't think she should avoid taking this but at the same time I'm not sure that it's gonna be sufficient to save her toe at this point. She tells me that she still having to care for grandparents which I think is putting quite [name] bit of strain on her foot and specifically the total area and has caused this to break down even to [name] greater degree than would've otherwise been expected. 03/05/19 on evaluation today patient actually appears to be doing quite well in regard to her toe all things considering. She still has bone exposed but there appears to be much less your thing on overall the appearance of the wound and the toe itself is dramatically improved. She still does have some issues currently obviously with infection she did see vascular as well and there concerned that her blood flow to the toad. For that reason they are setting up for an angiogram next week. 03/14/19 on evaluation today patient appears to be doing very poor in regard to her toe and specifically in regard to the ulceration and the fact that she's starting to notice the toe was leaning even more towards the lateral aspect and the complete joint is visible on the proximal aspect of the joint. Nonetheless she's also noted [name] significant odor and the tip of the toe is turning more dark and necrotic appearing. Overall I think she is getting  worse not better as far as this is concerned. For that reason I am recommending at this point that she likely needs to be seen for likely amputation. READMISSION 03/19/2021 This is [name] patient that we cared for in this clinic for [name] prolonged period of time in 2019 and 2020 with [name] left foot and left first toe wound. 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  Baker. In December 2021 she had an ulcer on her right great toe as well as the fourth and fifth toes. 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 She also had an angiogram at that time and underwent angioplasty of the right anterior tibial artery. In any case she claims that the wound on the right foot is closed I did not look at this today which was probably an oversight although I think that should be done next week. 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 According to Dr. Deborra Medina last review that she was out of the area being cared for by another physician but recently came back to his attention. 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 [name] culture of this area showed [name] coli apparently before she came back to see Dr. Luana Shu she was supposed to be receiving antibiotics but she did not really take them. Nor is she offloading this area at all. Finally her last hemoglobin A1c listed in epic was in March 2022 at 14.1 she says things are [name] lot better since then although I am not sure. 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 She had this wound at the time. During this admission she had [name] coli sepsis [name] TEE was negative. 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 She is still [name] heavy cigarette smoker. She has [name] history of bipolar disorder seizure disorder ABI in our clinic was 1.05 6/1; left midfoot in the setting of [name] TMA done previously. Round circular wound with [name] "knuckle" of protruding tissue. The problem is that the knuckle was not attached to any of the surrounding granulation and this probed proximally widely I removed [name] large portion of this tissue. This wound goes with considerable undermining laterally. I do not feel any bone there was no purulence but this is [name] deep wound. 6/8; in spite of the debridement I did last week. She arrives with [name] wound looking exactly the same. [name] protruding "knuckle" of tissue nonadherent to most of the surrounding tissue. There is considerable depth around this from 6-12 o'clock at 2.7 cm and undermining of 1 cm. This does not look overtly infected and the x- ray I did last week was negative for any osseous abnormalities. We have been using silver collagen 6/15; deep tissue culture I did last week showed moderate staph aureus and moderate Pseudomonas. This will definitely require prolonged antibiotic therapy. The pathology on the protuberant area was negative for malignancy fungus etc. 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 We have been using silver collagen. I am going to be prescribing Levaquin for 2 weeks. Her CT scan of the foot is down for 7/5 6/22; CT scan of the foot on 7 5. She says she has hardware in the left leg from her previous fracture. She is on the Levaquin for the deep tissue culture I did that showed methicillin sensitive staph aureus and Pseudomonas. I gave her [name] 2-week supply and she will have another week. She arrives in clinic today with the same protuberant tissue however this  is nonadherent to the tissue surrounding  it. I am really at [name] loss to explain this unless there is underlying deep tissue infection 6/29; patient presents for 1 week follow-up. She has been using collagen to the wound bed. She reports taking her antibiotics as prescribed.She has no complaints or issues today. She denies signs of infection. 7/6; patient presents for one week followup. She has been using collagen to the wound bed. She states she is taking Levaquin however at times she is not able to keep it down. She denies signs of infection. 7/13; patient presents for 1 week follow-up. She has been using silver alginate to the wound bed. She still has nausea when taking her antibiotics. She denies signs of infection. 7/20; patient presents for 1 week follow-up. She has been using silver alginate with gentamicin cream to the wound bed. She denies any issues and has no complaints today. She denies signs of infection. 7/27; patient presents for 1 week follow-up. She continues to use silver alginate with gentamicin cream to the wound bed. She reports starting her antibiotics. She has no issues or complaints. Overall she reports stability to the wound. 8/3; patient presents for 1 week follow-up. She has been using silver alginate with gentamicin cream to the wound bed. She reports completing all antibiotics. She has no issues or complaints today. She denies signs of infection. 8/17; patient presents for 2-week follow-up. He is to use silver alginate to the wound bed. She has no issues or complaints today. She denies signs of infection. She reports her pain has improved in her foot since last clinic visit 8/24; patient presents for 1 week follow-up. She continues to use silver alginate to the wound bed. She has no issues or complaints. She denies signs of infection. Pain is stable. 9/7; patient presents for follow-up. She missed her last week appointment due to feeling ill. She continues to use silver  alginate. She has [name] new wound to the right lower extremity that is covered in eschar. She states It occurred over the past week and has no idea how it started. She currently denies signs of infection. 9/14; patient presents for follow-up. T the left foot wound she has been using gentamicin cream and silver alginate. T the right lower extremity wound she has o o been keeping this covered and has not obtain Santyl. 9/21; patient presents for follow-up. She reports using gentamicin cream and silver alginate to the left foot and Santyl to the right lower extremity wound. She has no issues or complaints today. She denies signs of infection. 9/28; patient presents for follow-up. She reports [name] new wound to her right heel. She states this occurred [name] few days ago and is progressively gotten worse. She has been trying to clean the area with [name] Q-tip and Santyl. She reports stability in the other 2 wounds. She has been using gentamicin cream and silver alginate to the left foot and Santyl to the right lower extremity wound. [name], [name] ([id number]) [id number] Page 7 of 22 10/12; patient presents for follow-up. She reports improvement to the wound beds. She is seeing vein and vascular to discuss the potential of [name] left BKA. She states they are going to do an arteriogram. She continues to use silver alginate with dressing changes to her wounds. 11/2; patient presents for follow-up. She states she has not been doing dressing changes to the wound beds. She states she is not able to offload the areas. She reports chronic pain to her left foot wound. 11/9; patient presents for follow-up.  She came in with only socks on. She states she forgot to put on shoes. It is unclear if she is doing any dressing changes. She currently denies systemic signs of infection. 11/16; patient presents for follow-up. She came again only with socks on. She states she does not wear shoes ever. It is unclear  if she does dressing changes. She currently denies systemic signs of infection. 11/23; patient presents for follow-up. She wore her shoes today. It still unclear exactly what dressing she is using for each wound but she did states she obtained Dakin's solution and has been using this to the left foot wound. She currently denies signs of infection. 11/30; patient presents for follow-up. She has no issues or complaints today. She currently denies signs of infection. 12/7; patient presents for follow-up. She has no issues or complaints today. She has been using Hydrofera Blue to the right heel wound and Dakin solution to the left foot wound. Her right anterior leg wound is healed. She currently denies signs of infection. 12/14; patient presents for follow-up. She has been using Hydrofera Blue to the right heel and Dakin's to the left foot wounds. She has no issues or complaints today. She denies signs of infection. 12/21; patient presents for follow-up. She reports using Hydrofera Blue to the right heel and Dakin's to the left foot wound. She denies signs of infection. 12/28; patient presents for follow-up. She continues to use Dakin's to the left foot wound and Hydrofera Blue to the right heel wound. She denies signs of infection. 1/4; patient presents for follow-up. She has no issues or complaints today. She denies signs of infection. 1/11; patient presents for follow-up. It is unclear if she has been dressing these wounds over the past week. She currently denies signs of infection. 1/18; patient presents for follow-up. She states she has been using Dakin's wet-to-dry dressings to the left foot. She has been using Hydrofera Blue to the right foot foot wound. She states that the anterior right leg wound has reopened and draining serous fluid. She denies signs of infection. 1/25; patient presents for follow-up. She has no issues or complaints today. 2/1; patient presents for follow-up. She has no  issues or complaints today. She denies signs of infection. 2/8; patient presents for follow-up. She has lost her surgical shoes. She did not have [name] dressing to the right heel wound. She currently denies signs of infection. 2/15; patient presents for follow-up. She reports more pain to the right heel today. She denies purulent drainage Or fever/chills 2/22; patient presents for follow-up. She reports taking clindamycin over the past week. She states that she continues to have pain to her right heel. She reports purulent drainage. Readmission 03/16/2022 Ms. Kehlanie Tensley is [name] 47 year old female with [name] past medical history of type 2 diabetes, osteomyelitis to her feet, chronic systolic heart failure and bipolar disorder that presents to the clinic for bilateral feet wounds and right lower extremity wound. She was last seen in our clinic on 12/15/2021. At that time she had purulent drainage coming out of her right plantar foot and I recommended she go to the ED. She states she went to [facility] and has been there for the past 3 months. I cannot see the records. She states she had OR debridement and was on several weeks of IV antibiotics while inpatient. Since discharge she has not been taking care of the wound beds. She had nothing on her feet other than socks today. She currently denies signs  of infection. 5/31; patient presents for follow-up. She has been using Dakin's wet-to-dry dressings to the wound beds on her feet bilaterally and antibiotic ointment to the right anterior leg wound. She had [name] wound culture done at last clinic visit that showed moderate Pseudomonas aeruginosa sensitive to ciprofloxacin. She currently denies systemic signs of infection. 6/14; patient presents for follow-up. She received Keystone 5 days ago and has been using this on the wound beds. She states that last week she had to go to the hospital because she had increased warmth and erythema to the right foot. She  was started on 2 oral antibiotics. She states she has been taking these. She currently denies systemic signs of infection. She has no issues or complaints today. 6/21; patient presents for follow-up. She states she has been using Keystone antibiotics to the wound beds. She has no issues or complaints today. She denies signs of infection. 6/28; patient presents for follow-up. She has been using Keystone antibiotics to the wound beds. She has no issues or complaints today. 7/12; patient presents for follow-up. Has been using Keystone antibiotics to the wound beds with calcium alginate. She has no issues or complaints today. She never followed up with her orthopedic surgeon who did the OR debridement to the right foot. We discussed the total contact cast for the left foot and patient would like to do this next week. 7/19; patient presents for follow-up. She has been using Keystone antibiotics with calcium alginate to the wound beds. She has no issues or complaints today. Patient is in agreement to do the total contact cast of the left foot today. She knows to return later this week for the obligatory cast change. 05-13-2022 upon evaluation today patient's wound which she has the cast of the left leg actually appears to be doing significantly better. Fortunately I do not see any signs of active infection locally or systemically which is great news and overall I am extremely pleased with where we stand currently. 7/26; patient presents for follow-up. She has [name] cast in place for the past week. She states it irritated her shin. Other than that she tolerated the cast well. She states she would like [name] break for 1 week from the cast. We have been using Keystone antibiotic and Aquacel to both wound beds. She denies signs of infection. 8/2; patient presents for follow-up. She has been using Keystone and Aquacel to the wound beds. She denies any issues and has no complaints. She is agreeable to have the cast  placed today for the left leg. 06-03-2022 upon evaluation today patient appears to be doing well with regard to her wound she saw some signs of improvement which is great news. Fortunately I do not see any evidence of active infection locally or systemically at this time which is great news. No fevers, chills, nausea, vomiting, or diarrhea. 8/16; patient presents for follow-up. She has no issues or complaints today. We have been using Keystone and Aquacel to the wound beds. The left lower extremity is in [name] total contact cast. She is tolerated this well. 8/23; patient presents for follow-up. She has had the total contact cast on the left leg for the past week. Unfortunately this has rubbed and broken down the Wofford, Sacha [name] ([id number]) [id number] Page 8 of 22 skin to the medial foot. She currently denies signs of infection. She has been using Keystone antibiotic to the right plantar foot wound. 8/30; patient presents for follow-up. We have held off on the total  contact cast for the left leg for the past week. Her wound on the left foot has improved and the previous surrounding breakdown of skin has epithelialized. She has been using Keystone antibiotic to both wound beds. She has no issues or complaints today. She denies signs of infection. 9/6; patient presents for follow-up. She has ordered her's Keystone antibiotic and this is arriving this week. She has been doing Dakin's wet-to-dry dressings to the wound beds. She denies signs of infection. She is agreeable to the total contact cast today. 9/13; patient presents for follow-up. She states that the cast caused her left leg shin to be sore. She would like to take [name] break from the cast this week. She has been using Keystone antibiotic to the right plantar foot wound. She denies signs of infection. 9/20; patient presents for follow-up. She has been using Keystone antibiotics to the wound beds with calcium alginate to the  right foot wound and Hydrofera Blue to the left foot wound. She is agreeable to having the cast placed today. She has been approved for Apligraf and we will order this for next clinic visit. 9/27; patient presents for follow-up. We have been using Keystone antibiotic with Hydrofera Blue to the left foot wound under [name] total contact cast. T the right o foot wound she has been using Keystone antibiotic and calcium alginate. She declines [name] total contact cast today. Apligraf is available for placement and she would like to proceed with this. 07-28-2022 upon evaluation today patient appears to be doing well currently in regard to her wound. She is actually showing signs of significant improvement which is great news. Fortunately I do not see any evidence of active infection locally nor systemically at this time. She has been seeing Dr. Heber Fairview and to be honest has been doing very well with the cast. Subsequently she comes in today with [name] cast on and we did reapply that today as well. She did not really want to she try to talk me out of that but I explained that if she wanted to heal this is really the right way to go. Patient voiced understanding. In regard to her right foot this is actually [name] lot better compared to the last time I saw her which is also great news. 10/11; patient presents for follow-up. Apligraf and the total contact cast was placed to the left leg at last clinic visit. She states that her right foot wound had burning pain to it with the placement of Apligraf to this area. She has been doing [place] over this area. She denies signs of infection including increased warmth, erythema or purulent drainage. 11/1; 3-week follow-up. The patient fortunately did not have [name] total contact cast or an Apligraf and on the left foot. She has been using Keystone ABD pads and kerlix and her own running shoes She arrives in clinic today with thick callus and [name] very poor surface on the left foot on the right  nonviable skin subcutaneous tissue and [name] deep probing hole. 11/15; patient missed her last clinic appointment. She states she has not been dressing the wound beds for the past 2 weeks. She states that at she had [name] new roommate but is now going back to live with her mother. Apparently its been [name] distracting 2 weeks. Patient currently denies signs of infection. 11/22; patient presents for follow-up. She states she has been using Keystone antibiotic and Dakin's wet-to-dry dressings to the wound beds. She is agreeable for cast placement today. We  had ordered Apligraf however this has not been received by our facility. 11/29; Patient had [name] total contact cast placed at last clinic visit and she tolerated this well. We were using silver alginate under the cast. Patient's been using Keystone antibiotic with Aquacel to the right plantar foot wound. She has no issues or complaints today. Apligraf is available for placement today. Patient would like to proceed with this. 12/6; patient presents for follow-up. She had Apligraf placed in standard fashion last clinic visit under the total contact cast to the left lower extremity. She has been using Keystone antibiotic and Aquacel to the right plantar foot wound. She has no issues or complaints today. 12/13; patient presents for follow-up. She has finished 5 Apligraf placements. Was told she would not qualify for more. We have been doing [name] total contact cast to the left lower extremity. She has been using Keystone antibiotic and Aquacel to the right plantar foot wound. She has no issues or complaints today. 12/20; patient presents for follow-up. We have been using Hydrofera Blue with Keystone antibiotic under [name] total contact cast of the left lower extremity. She reports using Keystone antibiotic and silver alginate to the right heel wound. She has no issues or complaints today. 12/27; patient presents with [name] healthy wound on the left midfoot. We have Apligraf to apply  that to that more also using [name] total contact cast. On the right we are using Keystone and silver alginate. She is offloading the right heel with [name] surgical shoealthough by her admission she is on her feet quite [name] bit 1/3; patient presents for follow-up. Apligraf was placed to the wound beds last clinic visit. She was placed in [name] total contact cast to the left lower extremity. She declines [name] total contact cast today. She states that her mother is in the hospital and she cannot adequately get around with the cast on. 1/10; patient presents for follow-up. She declined the total contact cast at last clinic visit. Both wounds have declined in appearance. She states that she has been on her feet and not offloading the wound beds. She currently denies signs of infection. 1/17; patient presents for follow-up. She had the total contact cast along with Apligraf placed last week to the left lower extremity. She tolerated this well. She has been using Aquacel Ag and Keystone antibiotic to the right heel wound. She currently denies signs of infection. She has no issues or complaints today. 1/24; patient presents for follow-up. We have been using Apligraf to the left foot wound along with [name] total contact cast. She has done well with this. T the right o heel wound she has been using Aquacel Ag and Keystone antibiotic ointment. She has no issues or complaints today. She denies signs of infection. 1/31; patient presents for follow-up. We have been using Apligraf to the left foot wound along with the total contact cast. She continues to do well with this. To the right heel we have been using Aquacel Ag and Keystone antibiotic ointment. She has no issues or complaints today. 12-01-2022 upon evaluation patient is seen today on my schedule due to the fact that she unfortunately was in the hospital yesterday. Her cast needed to come off the only reason she is out of the hospital is due to the fact that they would not take it  off at the hospital which is somewhat bewildered reading to me to be perfectly honest. I am not certain why this was but either way she was  released and then was placed on my schedule today in order to get this off and reapply the total contact casting as appropriate. I do not have an Apligraf for her today it was applied last week and today's actually expired yesterday as there was some scheduling conflicts with her being in the hospital. Nonetheless we do not have that for reapplication today but the good news that she is not draining too much and the Apligraf can go for up to 2 weeks so I am going to go ahead and reapply the total contact casting but we are going to leave the Apligraf in place. 2/14; patient presents for follow-up. T the left leg she has had the total contact cast and Apligraf for the past week. She has had no issues with this. T the o o right heel she has been using antibiotic ointment and Aquacel Ag. 2/21; patient presents for follow-up. We have been using Apligraf and [name] total contact cast to the left lower extremity. She is tolerated this well. Unfortunately she is not approved for any more Apligraf per insurance. She has been using antibiotic ointment and Aquacel Ag to the right foot. She has no issues or complaints today. 2/28; patient presents for follow-up. We have been using Hydrofera Blue and antibiotic ointment under the total contact cast to the left lower extremity. He has been using Aquacel Ag with antibiotic ointment to the right plantar foot. She has no issues or complaints today. 3/6; patient presents for follow-up. She did not obtain her gentamicin ointment. She has been using Aquacel Ag to the right plantar foot wound. We have been using Hydrofera Blue with antibiotic ointment under the total contact cast to the left lower extremity. She has no issues or complaints today. 3/12; patient presents for follow-up. She has been using gentamicin ointment and Aquacel Ag  to the right plantar foot wound. We have been using Jenavieve Beagley, Merrianne [name] ([id number]) [id number] Page 9 of 22 Blue with antibiotic ointment under the total contact cast on the left lower extremity foot wound. She has no issues or complaints today. Electronic Signature(s) Signed: 01/03/2023 4:50:32 PM By: Kalman Shan DO Entered By: Kalman Shan on 01/03/2023 16:30:37 -------------------------------------------------------------------------------- Physical Exam Details Patient Name: Date of Service: [name], [name] 01/03/2023 3:45 PM Medical Record Number: [id number] Patient Account Number: [id number] Date of Birth/Sex: Treating RN: April 27, 1976 (47 y.o. [name] Primary Care Provider: Raelene Bott Other Clinician: Massie Kluver Referring Provider: Treating Provider/Extender: Eddie North in Treatment: 70 Constitutional . Cardiovascular . Psychiatric . Notes Right foot: T the plantar heel there is an incision site with increased depth, No probing to bone. T the opening there is Nonviable tissue and granulation tissue. o o Left foot: T the medial aspect there is an open wound with granulation tissue, Callus and nonviable tissue. No overt signs of infection to any of the wound o beds. Electronic Signature(s) Signed: 01/03/2023 4:50:32 PM By: Kalman Shan DO Entered By: Kalman Shan on 01/03/2023 16:31:50 -------------------------------------------------------------------------------- Physician Orders Details Patient Name: Date of Service: Heather Hitch [name]. 01/03/2023 3:45 PM Medical Record Number: [id number] Patient Account Number: [id number] Date of Birth/Sex: Treating RN: 03/09/1976 (48 y.o. [name] Primary Care Provider: Raelene Bott Other Clinician: Massie Kluver Referring Provider: Treating Provider/Extender: Eddie North in Treatment: 14 Verbal / Phone  Orders: No Diagnosis Coding Follow-up Appointments [name], [name] ([id number]) [id number] Page 10 of 22 Return Appointment in 1 week. Nurse Visit as  needed Bathing/ Shower/ Hygiene May shower with wound dressing protected with water repellent cover or cast protector. No tub bath. Anesthetic (Use 'Patient Medications' Section for Anesthetic Order Entry) Lidocaine applied to wound bed Edema Control - Lymphedema / Segmental Compressive Device / Other Elevate, Exercise Daily and [name] void Standing for Long Periods of Time. Elevate legs to the level of the heart and pump ankles as often as possible Elevate leg(s) parallel to the floor when sitting. Off-Loading Total Contact Cast to Left Lower Extremity Open toe surgical shoe - Heel off loader - Right foot Other: - keep pressure off of feet. Additional Orders / Instructions Follow Nutritious Diet and Increase Protein Intake Other: - Stay off of foot, wheelchair only Medications-Please add to medication list. Wound #11 Right,Plantar Foot Keystone Compound - use at home ntibiotic - apply Gentamycin in office only Topical [name] Wound Treatment Wound #11 - Foot Wound Laterality: Plantar, Right Cleanser: Wound Cleanser 1 x Per Day/30 Days Discharge Instructions: Wash your hands with soap and water. Remove old dressing, discard into plastic bag and place into trash. Cleanse the wound with Wound Cleanser prior to applying [name] clean dressing using gauze sponges, not tissues or cotton balls. Do not scrub or use excessive force. Pat dry using gauze sponges, not tissue or cotton balls. Topical: Gentamicin 1 x Per Day/30 Days Discharge Instructions: Apply as directed by provider. in clinic Prim Dressing: Aquacel Extra Hydrofiber Dressing, 2x2 (in/in) ary 1 x Per Day/30 Days Secondary Dressing: ABD Pad 5x9 (in/in) (Generic) 1 x Per Day/30 Days Discharge Instructions: Cover with ABD pad Secured With: Medipore T - 49M Medipore H  Soft Cloth Surgical T ape ape, 2x2 (in/yd) 1 x Per Day/30 Days Secured With: [employer] Sterile or Non-Sterile 6-ply 4.5x4 (yd/yd) 1 x Per Day/30 Days Discharge Instructions: Apply Kerlix as directed Wound #12 - Foot Wound Laterality: Plantar, Left, Medial Cleanser: Wound Cleanser 1 x Per Day/30 Days Discharge Instructions: Wash your hands with soap and water. Remove old dressing, discard into plastic bag and place into trash. Cleanse the wound with Wound Cleanser prior to applying [name] clean dressing using gauze sponges, not tissues or cotton balls. Do not scrub or use excessive force. Pat dry using gauze sponges, not tissue or cotton balls. Topical: Gentamicin 1 x Per Day/30 Days Discharge Instructions: Apply as directed by provider. in clinic Prim Dressing: Hydrofera Blue Ready Transfer Foam, 2.5x2.5 (in/in) 1 x Per Day/30 Days ary Discharge Instructions: Apply Hydrofera Blue Ready to wound bed as directed Prim Dressing: Silvercel 4 1/4x 4 1/4 (in/in) 1 x Per Day/30 Days ary Discharge Instructions: Apply to periwound Secondary Dressing: ABD Pad 5x9 (in/in) (Generic) 1 x Per Day/30 Days Discharge Instructions: Cover with ABD pad Secured With: Medipore T - 49M Medipore H Soft Cloth Surgical T ape ape, 2x2 (in/yd) 1 x Per Day/30 Days Secured With: [employer] Sterile or Non-Sterile 6-ply 4.5x4 (yd/yd) 1 x Per Day/30 Days Discharge Instructions: Apply Kerlix as directed Electronic Signature(s) Signed: 01/03/2023 4:50:32 PM By: Kalman Shan DO Entered By: Kalman Shan on 01/03/2023 16:39:44 [name] ([id number]) [id number] Page 11 of 22 -------------------------------------------------------------------------------- Problem List Details Patient Name: Date of Service: [name], [name] 01/03/2023 3:45 PM Medical Record Number: [id number] Patient Account Number: [id number] Date of Birth/Sex: Treating RN: [birth year] (47 y.o. [name] Primary  Care Provider: Raelene Bott Other Clinician: Massie Kluver Referring Provider: Treating Provider/Extender: Eddie North in [id number] Active Problems ICD-10 Encounter Code Description Active Date  MDM Diagnosis L97.528 Non-pressure chronic ulcer of other part of left foot with other specified 03/16/2022 No Yes severity L97.512 Non-pressure chronic ulcer of other part of right foot with fat layer exposed 03/16/2022 No Yes E11.621 Type 2 diabetes mellitus with foot ulcer 03/16/2022 No Yes E11.42 Type 2 diabetes mellitus with diabetic polyneuropathy 03/16/2022 No Yes L97.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 03/16/2022 No Yes of skin Inactive Problems Resolved Problems Electronic Signature(s) Signed: 01/03/2023 4:50:32 PM By: Kalman Shan DO Entered By: Kalman Shan on 01/03/2023 16:30:00 Progress Note Details -------------------------------------------------------------------------------- [name] ([id number]) [id number] Page 12 of 22 Patient Name: Date of Service: [name], [name] 01/03/2023 3:45 PM Medical Record Number: [id number] Patient Account Number: [id number] Date of Birth/Sex: Treating RN: 12-Mar-1976 (47 y.o. [name] Primary Care Provider: Raelene Bott Other Clinician: Massie Kluver Referring Provider: Treating Provider/Extender: Eddie North in Treatment: 72 Subjective Chief Complaint Information obtained from Patient 03/19/2021; patient referred by Dr. Luana Shu who has been looking after her left foot for quite [name] period of time for review of [name] nonhealing area in the left midfoot 03/12/2022; bilateral feet wounds and right lower extremity wound. History of Present Illness (HPI) 01/18/18-She is here for initial evaluation of the left great toe ulcer. She is [name] poor historian in regards to timeframe in detail. She states  approximately 4 weeks ago she lacerated her toe on something in the house. She followed up with her primary care who placed her on Bactrim and ultimately [name] second dose of Bactrim prior to coming to wound clinic. She states she has been treating the toe with peroxide, Betadine and [name] Band-Aid. She did not check her blood sugar this morning but checked it yesterday morning it was 327; she is unaware of [name] recent A1c and there are no current records. She saw Dr. she would've orthopedics last week for an old injury to the left ankle, she states he did not see her toe, nor did she bring it to his attention. She smokes approximately 1 pack cigarettes [name] day. Her social situation is concerning, she arrives this morning with her mother who appears extremely intoxicated/under the influence; her mother was asked to leave the room and be monitored by the patient's grandmother. The patient's aunt then accompanied the patient and the room throughout the rest of the appointment. We had [name] lengthy discussion regarding the deleterious effects of uncontrolled hyperglycemia and smoking as it relates to wound healing and overall health. She was strongly encouraged to decrease her smoking and get her diabetes under better control. She states she is currently on [name] diet and has cut down her [place] consumption. The left toe is erythematous, macerated and slightly edematous with malodor present. The edema in her left foot is below her baseline, there is no erythema streaking. We will treat her with Santyl, doxycycline; we have ordered and xray, culture and provided [name] Peg assist surgical shoe and cultured the wound. 01/25/18-She is here in follow-up evaluation for [name] left great toe ulcer and presents with an abscess to her suprapubic area. She states her blood sugars remain elevated, feeling "sick" and if levels are below 250, but she is trying. She has made no attempt to decrease her smoking stating that we "can't take away  her food in her cigarettes". She has been compliant with offloading using the PEG assist you. She is using Santyl daily. the culture obtained last week grew staph  aureus and Enterococcus faecalis; continues on the doxycycline and Augmentin was added on Monday. The suprapubic area has erythema, no femoral variation, purple discoloration, minimal induration, was accessed with [name] cotton tip applicator with sanguinopurulent drainage, this was cultured, I suspect the current antibiotic treatment will cover and we will not add anything to her current treatment plan. She was advised to go to urgent care or ER with any change in redness, induration or fever. 02/01/18-She is here in follow-up evaluation for left great toe ulcers and [name] new abdominal abscess from last week. She was able to use packing until earlier this week, where she "forgot it was there". She states she was feeling ill with GI symptoms last week and was not taking her antibiotic. She states her glucose levels have been predominantly less than 200, with occasional levels between 200-250. She thinks this was contributing to her GI symptoms as they have resolved without intervention. There continues to be significant laceration to left toe, otherwise it clinically looks stable/improved. There is now less superficial opening to the lateral aspect of the great toe that was residual blister. We will transition to [facility] to all wounds, she will continue her Augmentin. If there is no change or deterioration next week for reculture. 02/08/18-She is here in follow-up evaluation for left great toe ulcer and abdominal ulcer. There is an improvement in both wounds. She has been wrapping her left toe with coban, not by our direction, which has created an area of discoloration to the medial aspect; she has been advised to NOT use coban secondary to her neuropathy. She states her glucose levels have been high over this last week ranging from 200-350, she  continues to smoke. She admits to being less compliant with her offloading shoe. We will continue with same treatment plan and she will follow-up next week. 02/15/18-She is here in follow-up evaluation for left great toe ulcer and abdominal ulcer. The abdominal ulcer is epithelialized. The left great toe ulcer is improved and all injury from last week using the Coban wrap is resolved, the lateral ulcer is healed. She admits to noncompliance with wearing offloading shoe and admits to glucose levels being greater than 300 most of the week. She continues to smoke and expresses no desire to quit. There is one area medially that probes deeper than it has historically, erythema to the toe and dorsal foot has consistently waxed and waned. There is no overt signs of cellulitis or infection but we will culture the wound for any occult infection given the new area of depth and erythema. We will hold off on sensitivities for initiation of antibiotic therapy. 02/22/18-She is here in follow up evaluation for left great toe ulcer. There is overall significant improvement in both wound appearance, erythema and edema with changes made last week. She was not initiated on antibiotic therapy. Culture obtained last week showed oxacillin sensitive staph aureus, sensitive to clindamycin. Clindamycin has been called into the pharmacy but she has been instructed to hold off on initiation secondary to overall clinical improvement and her history of antibiotic intolerance. She has been instructed to contact the clinic with any noted changes/deterioration and the wound, erythema, edema and/or pain. She will follow-up next week. She continues to smoke and her glucose levels remain elevated >250; she admits to compliance with offloading shoe 03/01/18 on evaluation today patient appears to be doing fairly well in regard to her left first toe ulcer. She has been tolerating the dressing changes with the [facility]  Dressing without  complication and overall this has definitely showed signs of improvement according to records as well is what the patient tells me today. I'm very pleased in that regard. She is having no pain today 03/08/18 She is here for follow up evaluation of [name] left great toe ulcer. She remains non-compliant with glucose control and smoking cessation; glucose levels consistently >200. She states that she got new shoe inserts/peg assist. She admits to compliance with offloading. Since my last evaluation there is significant improvement. We will switch to prisma at this time and she will follow up next week. She is noted to be tachycardic at this appointment, heart rate 120s; she has [name] history of heart rate 70-130 according to our records. She admits to extreme agitation r/t personal issues; she was advised to monitor her heartrate and contact her physician if it does not return to [name] more normal range (<100). She takes cardizem twice daily. 03/15/18-She is here in follow-up evaluation for left great toe ulcer. She remains noncompliant with glucose control and smoking cessation. She admits to compliance with wearing offloading shoe. The ulcer is improved/stable and we will continue with the same treatment plan and she will follow-up next week 03/22/18-She is here for evaluation for left great toe ulcer. There continues to be significant improvement despite recurrent hyperglycemia (over 500 yesterday) and she continues to smoke. She has been compliant with offloading and we will continue with same treatment plan and she will follow-up next week. 03/29/18-She is here for evaluation for left great toe ulcer. Despite continuing to smoke and uncontrolled diabetes she continues to improve. She is compliant with offloading shoe. We will continue with the same treatment plan and she will follow-up next week 04/05/18- She is here in follow up evaluation for [name] left great toe ulcer; she presents with small pustule to left fifth toe  (resembles ant bite). She admits to compliance with wearing offloading shoe; continues to smoke or have uncontrolled blood glucose control. There is more callus than usual with evidence of bleeding; she denies known trauma. 04/12/18-She is here for evaluation of left great toe ulcer. Despite noncompliance with glycemic control and smoking she continues to make improvement. She continues to wear offloading shoe. The pustule, that was identified last week, to the left fifth toe is resolved. She will follow-up in 2 weeks 05/03/18-she is seen in follow-up evaluation for [name] left great toe ulcer. She is compliant with offloading, otherwise noncompliant with glycemic control and smoking. She has plateaued and there is minimal improvement noted. We will transition to [facility], replaced the insert to her surgical shoe and she will follow-up in one week 05/10/18- She is here in follow up evaluation for [name] left great toe ulcer. It appears stable despite measurement change. We will continue with same treatment plan and follow up next week. 05/24/18-She is seen in follow-up evaluation for [name] left great toe ulcer. She remains compliant with offloading, has made significant improvement in her diet, decreasing the amount of sugar/soda. She said her recent A1c was 10.9 which is lower than. She did see [name] diabetic nutritionist/educator yesterday. She continues to smoke. We will continue with the same treatment plan and she'll follow-up next week. 05/31/18- She is seen in follow-up evaluation for left great toe ulcer. She continues to remain compliant with offloading, continues to make improvement in her diet, increasing her water and decreasing the amount of sugar/soda. She does continue to smoke with no desire to quit. We will apply Prisma  to the depth and Hydrofera Blue over. We have not received insurance authorization for oasis. She will follow up next week. 06/07/18-She is seen in follow-up evaluation for left great  toe ulcer. It has stalled according to today's measurements although base appears stable. She says she saw [name] diabetic educator yesterday; her average blood sugars are less than 300 which is an improvement for her. She continues to smoke and states "that's my next step" She continues with water over soda. We will order for xray, culture and reinstate ace wrap compression prior to placing apligraf for next EYDIE, SLANSKY [name] ([id number]) [id number] Page 13 of 22 week. She is voicing no complaints or concerns. Her dressing will change to iodoflex over the next week in preparation for apligraf. 06/14/18-She is seen in follow-up evaluation for left great toe ulcer. Plain film x-ray performed last week was negative for osteomyelitis. Wound culture obtained last week grew strep B and OSSA; she is initiated on keflex and cefdinir today; there is erythema to the toe which could be from ace wrap compression, she has [name] history of wrapping too tight and has has been encouraged to maintain ace wraps that we place today. We will hold off on application of apligraf today, will apply next week after antibiotic therapy has been initiated. She admits today that she has resumed taking [name] shower with her foot/toe submerged in water, she has been reminded to keep foot/toe out of the bath water. She will be seen in follow up next week 06/21/18-she is seen in follow-up evaluation for left great toe ulcer. She is tolerating antibiotic therapy with no GI disturbance. The wound is stable. Apligraf was applied today. She has been decreasing her smoking, only had 4 cigarettes yesterday and 1 today. She continues being more compliant in diabetic diet. She will follow-up next week for evaluation of site, if stable will remove at 2 weeks. 06/28/18- She is here in follow up evalution. Apligraf was placed last week, she states the dressing fell off on Tuesday and she was dressing with hydrofera blue. She is healed  and will be discharged from the clinic today. She has been instructed to continue with smoking cessation, continue monitoring glucose levels, offloading for an additional 4 weeks and continue with hydrofera blue for additional two weeks for any possible microscopic opening. Readmission: 08/07/18 on evaluation today patient presents for reevaluation concerning the ulcer of her right great toe. She was previously discharged on 06/28/18 healed. Nonetheless she states that this began to show signs of drainage she subsequently went to her primary care provider. Subsequently an x-ray was performed on 08/01/18 which was negative. The patient was also placed on antibiotics at that time. Fortunately they should have been effective for the infection. Nonetheless she's been experiencing some improvement but still has [name] lot of drainage coming from the wound itself. 08/14/18 on evaluation today patient's wound actually does show signs of improvement in regard to the erythema at this point. She has completed the antibiotics. With that being said we did discuss the possibility of placing her in [name] total contact cast as of today although I think that I may want to give this just [name] little bit more time to ensure nothing recurrence as far as her infection is concerned. I do not want to put in the cast and risk infection at that time if things are not completely resolved. With that being said she is gonna require some debridement today. 08/21/18 on evaluation today patient actually appears to  be doing okay in regard to her toe ulcer. She's been tolerating the dressing changes without complication. With that being said it does appear that she is ready and in fact I think it's appropriate for [place] to go ahead and initiate the total contact cast today. Nonetheless she will require some sharp debridement to prepare the wound for application. Overall I feel like things have been progressing well but we do need to do something to  get this to close more readily. 08/24/18 patient seen today for reevaluation after having had the total contact cast applied on Tuesday. She seems to have done very well the wound appears to be doing great and overall I'm pleased with the progress that she's made. There were no abnormal areas of rubbing from the cast on her lower extremity. 08/30/18 on evaluation today patient actually appears to be completely healed in regard to her plantar toe ulcer. She tells me at this point she's been having [name] lot of issues with the cast. She almost fell [name] couple of times the state shall the step of her dog [name] couple times as well. This is been [name] very frustrating process for her other nonetheless she has completely healed the wound which is excellent news. Overall there does not appear to be the evidence of infection at this time which is great news. 09/11/18 evaluation today patient presents for follow-up concerning her great toe ulcer on the left which has unfortunately reopened since I last saw her which was only [name] couple of weeks ago. Unfortunately she was not able to get in to get the shoe and potentially the AFO that's gonna be necessary due to her left foot drop. She continues with offloading shoe but this is not enough to prevent her from reopening it appears. When we last had her in the total contact cast she did well from [name] healing standpoint but unfortunately the wound reopened as soon as she came out of the cast within just [name] couple of weeks. 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 This is also part of the reason why she falls on [name] regular basis. I just do not believe that is gonna be safe for [place] to reinitiate the total contact cast as last time we had this on she fell 3 times one week which is definitely  not normal for her. 09/18/18 upon evaluation today the patient actually appears to be doing about the same in regard to her toe ulcer. She did not contact [facility] as I asked her to even though I had given her the prescription. In fact she actually states that she has no idea where the prescription is. She did apparently call [facility] and they told her that all she needed to do was bring the prescription in order to be able to be seen and work on getting the AFO for her left foot. With all that being said she still does not have an appointment and I'm not sure were things stand that regard. I will give her [name] new prescription today in order to contact them to get this set up. 09/25/18 on evaluation today patient actually appears to be doing about the same in regard to her toes ulcer. She does have [name] small areas which seems to have [name]  lot of callous buildup around the edge of the wound which is going to need sharp debridement today. She still is waiting to be scheduled for evaluation with [facility] for possibility of an AFO. She states there supposed to call her tomorrow to get this set up. Unfortunately it does appear that her foot specifically the toe area is showing signs of erythema. There does not appear to be any systemic infection which is in these good news. 10/02/18 on evaluation today patient actually appears to be doing about the same in regard to her toe ulcer. This really has not done too well although it's not significantly larger it's also not significantly smaller. She has been tolerating the dressing changes without complication. She actually has her appointment with [facility] and [facility] tomorrow to hopefully be measured for obtaining and AFO splint. I think this would be helpful preventing this from reoccurring. We had contemplated starting the cast this week although to be honest I am reluctant to do that as she's been having nausea, vomiting, and seizure activity over the past three days.  She has [name] history of seizures and have been told is nothing that can be done for these. With that being said I do believe that along with the seizures have the nausea vomiting which upon further questioning doesn't seem to be the normal for her and makes me concerned for the possibility of infection or something else going on. I discussed this with the patient and her mother during the office visit today. I do not feel the wound is effective but maybe something else. The responses this was "this just happens to her at times and we don't know why". They did not seem to be interested in going to the hospital to have this checked out further. 10/09/18 on evaluation today patient presents for follow-up concerning her ongoing toe ulcer. She has been tolerating the dressing changes without complication. Fortunately there does not appear to be any evidence of infection which is great news however I do think that the patient would benefit from going ahead for with the total contact cast. She's actually in [name] wheelchair today she tells me that she will use her walker if we initiate the cast. I was very specific about the fact that if we were gonna do the cast I wanted to make sure that she was using the walker in order to prevent any falls. She tells me she does not have stairs that she has to traverse on [name] regular basis at her home. She has not had any seizures since last week again that something that happens to her often she tells me she did talk to [employer] and they said that it may take up to three weeks to get the brace approved for her. Hopefully that will not take that long but nonetheless in the meantime I do think the cast could be of benefit. 10/12/18 on evaluation today patient appears to be doing rather well in regard to her toe ulcer. It's just been [name] few days and already this is significantly improved both as far as overall appearance and size. Fortunately there's no sign of infection. She is here  for her first obligatory cast change. 10/19/18 Seen today for follow up and management of left great toe ulcer. Wound continues to show improvement. Noted small open area with seroussang drainage with palpation. Denies any increased pain or recent fevers during visit. She will continue calcium alginate with offloading shoe. Denies any questions or concerns during visit.  10/26/18 on evaluation today patient appears to be doing about the same as when I last saw her in regard to her wound bed. Fortunately there does not appear to be any signs of infection. Unfortunately she continues to have [name] breakdown in regard to the toe region any time that she is not in the cast. It takes almost no time at all for this to happen. Nonetheless she still has not heard anything from the brace being made by [employer] as to when exactly this will be available to her. Fortunately there is no signs of infection at this time. 10/30/18 on evaluation today patient presents for application of the total contact cast as we just received him this morning. Fortunately we are gonna be able to apply this to her today which is great news. She continues to have no significant pain which is good news. Overall I do feel like things have been improving while she was the cast is when she doesn't have [name] cast that things get worse. She still has not really heard anything from [facility] regarding her brace. LACE, CRUEY [name] ([id number]) [id number] Page 14 of 22 11/02/18 upon evaluation today patient's wound already appears to be doing significantly better which is good news. Fortunately there does not appear to be any signs of infection also good news. Overall I do think the total contact cast as before is helping to heal this area unfortunately it's just not gonna likely keep the area closed and healed without her getting her brace at least. Again the foot drop is [name] significant issue for her. 11/09/18 on evaluation today  patient appears to be doing excellent in regard to her toe ulcer which in fact is completely healed. Fortunately we finally got the situation squared away with the paperwork which was needed to proceed with getting her brace approved by Medicaid. I have filled that out unfortunately that information has been sent to the orthopedic office that I worked at 2 1/2 years ago and not tired Current wound care measures. Fortunately she seems to be doing very well at this time. 11/23/18 on evaluation today patient appears to be doing More Poorly Compared to Last Time I Saw Her. At [facility] She Had Completely Healed. Currently she is continuing to have issues with reopening. She states that she just found out that the brace was approved through Medicaid now she just has to go get measured in order to have this fitted for her and then made. Subsequently she does not have an appointment for this yet that is going to complicate things we obviously cannot put her back in the cast if we do not have everything measured because they're not gonna be able to measure her foot while she is in the cast. 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 Obviously this is unfortunate and does have me somewhat worried as well. 11/30/18 on evaluation today patient's toe ulcer actually appears to be doing fairly well. The good news is she will be getting her brace in the shoes next week on Wednesday. Hopefully we will be able to get this to heal without having to go back in the cast however she may need the cast in order to get the wound completely heal and then go from there. Fortunately there's no signs of infection at this time. 12/07/18 on evaluation today patient fortunately did receive her brace and  she states she could tell this definitely makes her walk better. With that being said she's been having issues with her toe where she  noticed yesterday there was [name] lot of tissue that was loosing off this appears to be much larger than what it was previous. She also states that her leg has been read putting much across the top of her foot just about the ankle although this seems to be receiving somewhat. The total area is still red and appears to be someone infected as best I can tell. She is previously taken Bactrim and that may be [name] good option for her today as well. We are gonna see what I wound culture shows as well and I think that this is definitely appropriate. With that being said outside of the culture I still need to initiate something in the interim and that's what I'm gonna go ahead and select Bactrim is [name] good option for her. 12/14/18 on evaluation today patient appears to be doing better in regard to her left great toe ulcer as compared to last week's evaluation. There's still some erythema although this is significantly improved which is excellent news. Overall I do believe that she is making good progress is still gonna take some time before she is where I would like her to be from the standpoint of being able to place her back into the total contact cast. Hopefully we will be where we need to be by next week. 12/21/18 on evaluation today patient actually appears to be doing poorly in regard to her toe ulcer. She's been tolerating the dressing changes without complication. Fortunately there's no signs of systemic infection although she does have [name] lot of drainage from the toe ulcer and this does seem to be causing some issues at this point. She does have erythema on the distal portion of her toe that appears to be likely cellulitis. 12/28/18 on evaluation today patient actually appears to be doing [name] little better in my pinion in regard to her toe ulcer. With that being said she still does have some evidence of infection at this time and for her culture she had both [name] coli as well as enterococcus as organisms noted on  evaluation. For that reason I think that though the Keflex likely has treated the [name] coli rather well this has really done nothing for the enterococcus. We are going to have to initiate treatment for this specifically. 01/04/19 on evaluation today patient's toe actually appears to be doing better from the standpoint of infection. She currently would like to see about putting the cash back on I think that this is appropriate as long as she takes care of it and keeps it from getting wet. She is gonna have some drainage we can definitely pass this up with Drawtex and alginate to try to prevent as much drainage as possible from causing the problems. With that being said I do want to at least try her with the cast between now and Tuesday. If there any issues we can't continue to use it then I will discontinue the use of the cast at that point. 01/08/19 on evaluation today patient actually appears to be doing very well as far as her foot ulcer specifically the great toe on the left is concerned. She did have an area of rubbing on the medial aspect of her left ankle which again is from the cast. Fortunately there's no signs of infection at this point in this appears to be [name] very  slight skin breakdown. The patient tells me she felt it rubbing but didn't think it was that bad. Fortunately there is no signs of active infection at this time which is good news. No fevers, chills, nausea, or vomiting noted at this time. 01/15/19 on evaluation today patient actually appears to be doing well in regard to her toe ulcer. Again as previous she seems to do well and she has the cast on which indicates to me that during the time she doesn't have [name] cast on she's putting way too much pressure on this region. Obviously I think that's gonna be an issue as with the current national emergency concerning the Covid-19 Virus it has been recommended that we discontinue the use of total contact casting by the chief medical officer of  our company, Dr. Simona Huh. The reasoning is that if [name] patient becomes sick and cannot come into have the cast removed they could not just leave this on for an additional two weeks. Obviously the hospitals also do not want to receive patient's who are sick into the emergency department to potentially contaminate the region and spread the Covid-19 Virus among other sick individuals within the hospital system. Therefore at this point we are suspending the use of total contact cast until the current emergency subsides. This was all discussed with the patient today as well. 01/22/19 on evaluation today patient's wound on her left great toe appears to be doing slightly worse than previously noted last week. She tells me that she has been on this quite [name] bit in fact she tells me she's been awake for 38 straight hours. This is due to the fact that she's having to care for grandparents because nobody else will. She has been taking care of them for five the last seven days since I've seen her they both have dementia his is from [name] stroke and her grandmother's was progressive. Nonetheless she states even her mom who knows her condition and situation has only help two of those days to take care of them she's been taking care of the rest. Fortunately there does not appear to be any signs of active infection in regard to her toe at this point although obviously it doesn't look as good as it did previous. I think this is directly related to her not taking off the pressure and friction by way of taking things easy. Though I completely understand what's going on. 01/29/19 on evaluation today patient's tools are actually appears to be showing some signs of improvement today compared to last week's evaluation as far as not necessarily the overall size of the wound but the fact that she has some new skin growth in between the two ends of the wound opening. Overall I feel like she has done well she states that she had [name] family  member give her what sounds to be [name] CAM walker boot which has been helpful as well. 02/05/19 on evaluation today patient's wound bed actually appears to be doing significantly better in regard to her overall appearance of the size of the wound. With that being said she is still having an issue with offloading efficiently enough to get this to close. Apparently there is some signs of infection at this point as well unfortunately. Previously she's done well of Augmentin I really do not see anything that needs to be culture currently but there theme and cellulitis of the foot that I'm seeing I'm gonna go ahead and place her on an antibiotic today to try  to help clear this up. 02/12/2019 on evaluation today patient actually appears to be doing poorly in regard to her overall wound status. She tells me she has been using her offloading shoe but actually comes in today wearing her tennis shoe with the AFO brace. Again as I previously discussed with her this is really not sufficient to allow the area to heal appropriately. Nonetheless she continues to be somewhat noncompliant and I do wonder based on what she has told my nurse in the past as to whether or not [name] good portion of this noncompliance may be recreational drug and alcohol related. She has had [name] history of heroin overdose and this was fairly recently in the past couple of months that have been seeing her. Nonetheless overall I feel like her wound looks significantly worse today compared to what it was previous. She still has significant erythema despite the Augmentin I am not sure that this is an appropriate medication for her infection I am also concerned that the infection may have gone down into her bone. 02/19/19 on evaluation today patient actually appears to be doing about the same in regard to her toe ulcer. Unfortunately she continues to show signs of bone exposure and infection at this point. There does not appear to be any evidence of worsening  of the infection but I'm also not really sure that it's getting significantly better. She is on the Augmentin which should be sufficient for the Staphylococcus aureus infection that she has at this point. With that being said she may need IV antibiotics to more appropriately treat this. We did have [name] discussion today about hyperbaric option therapy. 02/28/19 on evaluation today patient actually appears to be doing much worse in regard to the wound on her left great toe as compared to even my previous evaluation last week. Unfortunately this seems to be training in [name] pretty poor direction. Her toe was actually now starting to angle laterally and I can actually see the entire joint area of the proximal portion of the digit where is the distal portion of the digit again is no longer even in contact with the joint line. KYAIRA, MCMEANS [name] ([id number]) [id number] Page 15 of 22 Unfortunately there's [name] lot more necrotic tissue around the edge and the toe appears to be showing signs of becoming gangrenous in my pinion. I'm very concerned about were things stand at this point. She did see infectious disease and they are planning to send in [name] prescription for Sivextro for her and apparently this has been approved. With that being said I don't think she should avoid taking this but at the same time I'm not sure that it's gonna be sufficient to save her toe at this point. She tells me that she still having to care for grandparents which I think is putting quite [name] bit of strain on her foot and specifically the total area and has caused this to break down even to [name] greater degree than would've otherwise been expected. 03/05/19 on evaluation today patient actually appears to be doing quite well in regard to her toe all things considering. She still has bone exposed but there appears to be much less your thing on overall the appearance of the wound and the toe itself is dramatically improved. She  still does have some issues currently obviously with infection she did see vascular as well and there concerned that her blood flow to the toad. For that reason they are setting up for an angiogram next  week. 03/14/19 on evaluation today patient appears to be doing very poor in regard to her toe and specifically in regard to the ulceration and the fact that she's starting to notice the toe was leaning even more towards the lateral aspect and the complete joint is visible on the proximal aspect of the joint. Nonetheless she's also noted [name] significant odor and the tip of the toe is turning more dark and necrotic appearing. Overall I think she is getting worse not better as far as this is concerned. For that reason I am recommending at this point that she likely needs to be seen for likely amputation. READMISSION 03/19/2021 This is [name] patient that we cared for in this clinic for [name] prolonged period of time in 2019 and 2020 with [name] left foot and left first toe wound. 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 Luana Shu. In December 2021 she had an ulcer on her right great toe as well as the fourth and fifth toes. 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 She also had an angiogram at that time and underwent angioplasty of the right anterior tibial artery. In any case she claims that the wound on the right foot is closed I did not look at this today which was probably an oversight although I think that should be done next week. 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 According to Dr. Deborra Medina last review that she was out of the area being cared for by another physician but recently came back to his attention. 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 [name] culture of this area showed [name] coli apparently before  she came back to see Dr. Luana Shu she was supposed to be receiving antibiotics but she did not really take them. Nor is she offloading this area at all. Finally her last hemoglobin A1c listed in epic was in March 2022 at 14.1 she says things are [name] lot better since then although I am not sure. 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 She had this wound at the time. During this admission she had [name] coli sepsis [name] TEE was negative. 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 She is still [name] heavy cigarette smoker. She has [name] history of bipolar disorder seizure disorder ABI in our clinic was 1.05 6/1; left midfoot in the setting of [name] TMA done previously. Round circular wound with [name] "knuckle" of protruding tissue. The problem is that the knuckle was not attached to any of the surrounding granulation and this probed proximally widely I removed [name] large portion of this tissue. This wound goes with considerable undermining laterally. I do not feel any bone there was no purulence but this is [name] deep wound. 6/8; in spite of the debridement I did last week. She arrives with [name] wound looking exactly the same. [name] protruding "knuckle" of tissue nonadherent to most of the surrounding tissue. There is considerable depth around this from 6-12 o'clock at 2.7 cm and undermining of 1 cm. This does not look  overtly infected and the x- ray I did last week was negative for any osseous abnormalities. We have been using silver collagen 6/15; deep tissue culture I did last week showed moderate staph aureus and moderate Pseudomonas. This will definitely require prolonged antibiotic therapy. The  pathology on the protuberant area was negative for malignancy fungus etc. 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 We have been using silver collagen. I am going to be prescribing Levaquin for 2 weeks. Her CT scan of the foot is down for 7/5 6/22; CT scan of the foot on 7 5. She says she has hardware in the left leg from her previous fracture. She is on the Levaquin for the deep tissue culture I did that showed methicillin sensitive staph aureus and Pseudomonas. I gave her [name] 2-week supply and she will have another week. She arrives in clinic today with the same protuberant tissue however this is nonadherent to the tissue surrounding it. I am really at [name] loss to explain this unless there is underlying deep tissue infection 6/29; patient presents for 1 week follow-up. She has been using collagen to the wound bed. She reports taking her antibiotics as prescribed.She has no complaints or issues today. She denies signs of infection. 7/6; patient presents for one week followup. She has been using collagen to the wound bed. She states she is taking Levaquin however at times she is not able to keep it down. She denies signs of infection. 7/13; patient presents for 1 week follow-up. She has been using silver alginate to the wound bed. She still has nausea when taking her antibiotics. She denies signs of infection. 7/20; patient presents for 1 week follow-up. She has been using silver alginate with gentamicin cream to the wound bed. She denies any issues and has no complaints today. She denies signs of infection. 7/27; patient presents for 1 week follow-up. She continues to use silver alginate with gentamicin cream to the wound bed. She reports starting her antibiotics. She has no issues or complaints. Overall she reports stability to the wound. 8/3; patient presents for 1 week follow-up. She has been using silver alginate with gentamicin cream to  the wound bed. She reports completing all antibiotics. She has no issues or complaints today. She denies signs of infection. 8/17; patient presents for 2-week follow-up. He is to use silver alginate to the wound bed. She has no issues or complaints today. She denies signs of infection. She reports her pain has improved in her foot since last clinic visit 8/24; patient presents for 1 week follow-up. She continues to use silver alginate to the wound bed. She has no issues or complaints. She denies signs of infection. Pain is stable. 9/7; patient presents for follow-up. She missed her last week appointment due to feeling ill. She continues to use silver alginate. She has [name] new wound to the right lower extremity that is covered in eschar. She states It occurred over the past week and has no idea how it started. She currently denies signs of infection. 9/14; patient presents for follow-up. T the left foot wound she has been using gentamicin cream and silver alginate. T the right lower extremity wound she has o o been keeping this covered and has not obtain Santyl. 9/21; patient presents for follow-up. She reports using gentamicin cream and silver alginate to the left foot and Santyl to the right lower extremity wound. She has no issues or complaints today. She denies signs  of infection. 9/28; patient presents for follow-up. She reports [name] new wound to her right heel. She states this occurred [name] few days ago and is progressively gotten worse. She has been trying to clean the area with [name] Q-tip and Santyl. She reports stability in the other 2 wounds. She has been using gentamicin cream and silver alginate to the left foot and Santyl to the right lower extremity wound. 10/12; patient presents for follow-up. She reports improvement to the wound beds. She is seeing vein and vascular to discuss the potential of [name] left BKA. She states they are going to do an arteriogram. She continues to use silver alginate with  dressing changes to her wounds. 11/2; patient presents for follow-up. She states she has not been doing dressing changes to the wound beds. She states she is not able to offload the areas. She reports chronic pain to her left foot wound. 11/9; patient presents for follow-up. She came in with only socks on. She states she forgot to put on shoes. It is unclear if she is doing any dressing changes. She currently denies systemic signs of infection. 11/16; patient presents for follow-up. She came again only with socks on. She states she does not wear shoes ever. It is unclear if she does dressing changes. She currently denies systemic signs of infection. 11/23; patient presents for follow-up. She wore her shoes today. It still unclear exactly what dressing she is using for each wound but she did states she LATRISTA, SMOLENSKI [name] ([id number]) [id number] Page 16 of 22 obtained Dakin's solution and has been using this to the left foot wound. She currently denies signs of infection. 11/30; patient presents for follow-up. She has no issues or complaints today. She currently denies signs of infection. 12/7; patient presents for follow-up. She has no issues or complaints today. She has been using Hydrofera Blue to the right heel wound and Dakin solution to the left foot wound. Her right anterior leg wound is healed. She currently denies signs of infection. 12/14; patient presents for follow-up. She has been using Hydrofera Blue to the right heel and Dakin's to the left foot wounds. She has no issues or complaints today. She denies signs of infection. 12/21; patient presents for follow-up. She reports using Hydrofera Blue to the right heel and Dakin's to the left foot wound. She denies signs of infection. 12/28; patient presents for follow-up. She continues to use Dakin's to the left foot wound and Hydrofera Blue to the right heel wound. She denies signs of infection. 1/4; patient presents  for follow-up. She has no issues or complaints today. She denies signs of infection. 1/11; patient presents for follow-up. It is unclear if she has been dressing these wounds over the past week. She currently denies signs of infection. 1/18; patient presents for follow-up. She states she has been using Dakin's wet-to-dry dressings to the left foot. She has been using Hydrofera Blue to the right foot foot wound. She states that the anterior right leg wound has reopened and draining serous fluid. She denies signs of infection. 1/25; patient presents for follow-up. She has no issues or complaints today. 2/1; patient presents for follow-up. She has no issues or complaints today. She denies signs of infection. 2/8; patient presents for follow-up. She has lost her surgical shoes. She did not have [name] dressing to the right heel wound. She currently denies signs of infection. 2/15; patient presents for follow-up. She reports more pain to the right heel today. She denies purulent drainage  Or fever/chills 2/22; patient presents for follow-up. She reports taking clindamycin over the past week. She states that she continues to have pain to her right heel. She reports purulent drainage. Readmission 03/16/2022 Ms. Wajeeha Alsbury is [name] 47 year old female with [name] past medical history of type 2 diabetes, osteomyelitis to her feet, chronic systolic heart failure and bipolar disorder that presents to the clinic for bilateral feet wounds and right lower extremity wound. She was last seen in our clinic on 12/15/2021. At that time she had purulent drainage coming out of her right plantar foot and I recommended she go to the ED. She states she went to [facility] and has been there for the past 3 months. I cannot see the records. She states she had OR debridement and was on several weeks of IV antibiotics while inpatient. Since discharge she has not been taking care of the wound beds. She had nothing on her feet other  than socks today. She currently denies signs of infection. 5/31; patient presents for follow-up. She has been using Dakin's wet-to-dry dressings to the wound beds on her feet bilaterally and antibiotic ointment to the right anterior leg wound. She had [name] wound culture done at last clinic visit that showed moderate Pseudomonas aeruginosa sensitive to ciprofloxacin. She currently denies systemic signs of infection. 6/14; patient presents for follow-up. She received Keystone 5 days ago and has been using this on the wound beds. She states that last week she had to go to the hospital because she had increased warmth and erythema to the right foot. She was started on 2 oral antibiotics. She states she has been taking these. She currently denies systemic signs of infection. She has no issues or complaints today. 6/21; patient presents for follow-up. She states she has been using Keystone antibiotics to the wound beds. She has no issues or complaints today. She denies signs of infection. 6/28; patient presents for follow-up. She has been using Keystone antibiotics to the wound beds. She has no issues or complaints today. 7/12; patient presents for follow-up. Has been using Keystone antibiotics to the wound beds with calcium alginate. She has no issues or complaints today. She never followed up with her orthopedic surgeon who did the OR debridement to the right foot. We discussed the total contact cast for the left foot and patient would like to do this next week. 7/19; patient presents for follow-up. She has been using Keystone antibiotics with calcium alginate to the wound beds. She has no issues or complaints today. Patient is in agreement to do the total contact cast of the left foot today. She knows to return later this week for the obligatory cast change. 05-13-2022 upon evaluation today patient's wound which she has the cast of the left leg actually appears to be doing significantly better. Fortunately I  do not see any signs of active infection locally or systemically which is great news and overall I am extremely pleased with where we stand currently. 7/26; patient presents for follow-up. She has [name] cast in place for the past week. She states it irritated her shin. Other than that she tolerated the cast well. She states she would like [name] break for 1 week from the cast. We have been using Keystone antibiotic and Aquacel to both wound beds. She denies signs of infection. 8/2; patient presents for follow-up. She has been using Keystone and Aquacel to the wound beds. She denies any issues and has no complaints. She is agreeable to have the  cast placed today for the left leg. 06-03-2022 upon evaluation today patient appears to be doing well with regard to her wound she saw some signs of improvement which is great news. Fortunately I do not see any evidence of active infection locally or systemically at this time which is great news. No fevers, chills, nausea, vomiting, or diarrhea. 8/16; patient presents for follow-up. She has no issues or complaints today. We have been using Keystone and Aquacel to the wound beds. The left lower extremity is in [name] total contact cast. She is tolerated this well. 8/23; patient presents for follow-up. She has had the total contact cast on the left leg for the past week. Unfortunately this has rubbed and broken down the skin to the medial foot. She currently denies signs of infection. She has been using Keystone antibiotic to the right plantar foot wound. 8/30; patient presents for follow-up. We have held off on the total contact cast for the left leg for the past week. Her wound on the left foot has improved and the previous surrounding breakdown of skin has epithelialized. She has been using Keystone antibiotic to both wound beds. She has no issues or complaints today. She denies signs of infection. 9/6; patient presents for follow-up. She has ordered her's Keystone  antibiotic and this is arriving this week. She has been doing Dakin's wet-to-dry dressings to the wound beds. She denies signs of infection. She is agreeable to the total contact cast today. 9/13; patient presents for follow-up. She states that the cast caused her left leg shin to be sore. She would like to take [name] break from the cast this week. She has been using Keystone antibiotic to the right plantar foot wound. She denies signs of infection. 9/20; patient presents for follow-up. She has been using Keystone antibiotics to the wound beds with calcium alginate to the right foot wound and Hydrofera Quaintance, Ijanae [name] ([id number]) [id number] Page 17 of 22 Blue to the left foot wound. She is agreeable to having the cast placed today. She has been approved for Apligraf and we will order this for next clinic visit. 9/27; patient presents for follow-up. We have been using Keystone antibiotic with Hydrofera Blue to the left foot wound under [name] total contact cast. T the right o foot wound she has been using Keystone antibiotic and calcium alginate. She declines [name] total contact cast today. Apligraf is available for placement and she would like to proceed with this. 07-28-2022 upon evaluation today patient appears to be doing well currently in regard to her wound. She is actually showing signs of significant improvement which is great news. Fortunately I do not see any evidence of active infection locally nor systemically at this time. She has been seeing Dr. Heber Chicken and to be honest has been doing very well with the cast. Subsequently she comes in today with [name] cast on and we did reapply that today as well. She did not really want to she try to talk me out of that but I explained that if she wanted to heal this is really the right way to go. Patient voiced understanding. In regard to her right foot this is actually [name] lot better compared to the last time I saw her which is also great  news. 10/11; patient presents for follow-up. Apligraf and the total contact cast was placed to the left leg at last clinic visit. She states that her right foot wound had burning pain to it with the placement of Apligraf to  this area. She has been doing Corley over this area. She denies signs of infection including increased warmth, erythema or purulent drainage. 11/1; 3-week follow-up. The patient fortunately did not have [name] total contact cast or an Apligraf and on the left foot. She has been using Keystone ABD pads and kerlix and her own running shoes She arrives in clinic today with thick callus and [name] very poor surface on the left foot on the right nonviable skin subcutaneous tissue and [name] deep probing hole. 11/15; patient missed her last clinic appointment. She states she has not been dressing the wound beds for the past 2 weeks. She states that at she had [name] new roommate but is now going back to live with her mother. Apparently its been [name] distracting 2 weeks. Patient currently denies signs of infection. 11/22; patient presents for follow-up. She states she has been using Keystone antibiotic and Dakin's wet-to-dry dressings to the wound beds. She is agreeable for cast placement today. We had ordered Apligraf however this has not been received by our facility. 11/29; Patient had [name] total contact cast placed at last clinic visit and she tolerated this well. We were using silver alginate under the cast. Patient's been using Keystone antibiotic with Aquacel to the right plantar foot wound. She has no issues or complaints today. Apligraf is available for placement today. Patient would like to proceed with this. 12/6; patient presents for follow-up. She had Apligraf placed in standard fashion last clinic visit under the total contact cast to the left lower extremity. She has been using Keystone antibiotic and Aquacel to the right plantar foot wound. She has no issues or complaints today. 12/13; patient  presents for follow-up. She has finished 5 Apligraf placements. Was told she would not qualify for more. We have been doing [name] total contact cast to the left lower extremity. She has been using Keystone antibiotic and Aquacel to the right plantar foot wound. She has no issues or complaints today. 12/20; patient presents for follow-up. We have been using Hydrofera Blue with Keystone antibiotic under [name] total contact cast of the left lower extremity. She reports using Keystone antibiotic and silver alginate to the right heel wound. She has no issues or complaints today. 12/27; patient presents with [name] healthy wound on the left midfoot. We have Apligraf to apply that to that more also using [name] total contact cast. On the right we are using Keystone and silver alginate. She is offloading the right heel with [name] surgical shoealthough by her admission she is on her feet quite [name] bit 1/3; patient presents for follow-up. Apligraf was placed to the wound beds last clinic visit. She was placed in [name] total contact cast to the left lower extremity. She declines [name] total contact cast today. She states that her mother is in the hospital and she cannot adequately get around with the cast on. 1/10; patient presents for follow-up. She declined the total contact cast at last clinic visit. Both wounds have declined in appearance. She states that she has been on her feet and not offloading the wound beds. She currently denies signs of infection. 1/17; patient presents for follow-up. She had the total contact cast along with Apligraf placed last week to the left lower extremity. She tolerated this well. She has been using Aquacel Ag and Keystone antibiotic to the right heel wound. She currently denies signs of infection. She has no issues or complaints today. 1/24; patient presents for follow-up. We have been using Apligraf to  the left foot wound along with [name] total contact cast. She has done well with this. T the right o heel wound  she has been using Aquacel Ag and Keystone antibiotic ointment. She has no issues or complaints today. She denies signs of infection. 1/31; patient presents for follow-up. We have been using Apligraf to the left foot wound along with the total contact cast. She continues to do well with this. To the right heel we have been using Aquacel Ag and Keystone antibiotic ointment. She has no issues or complaints today. 12-01-2022 upon evaluation patient is seen today on my schedule due to the fact that she unfortunately was in the hospital yesterday. Her cast needed to come off the only reason she is out of the hospital is due to the fact that they would not take it off at the hospital which is somewhat bewildered reading to me to be perfectly honest. I am not certain why this was but either way she was released and then was placed on my schedule today in order to get this off and reapply the total contact casting as appropriate. I do not have an Apligraf for her today it was applied last week and today's actually expired yesterday as there was some scheduling conflicts with her being in the hospital. Nonetheless we do not have that for reapplication today but the good news that she is not draining too much and the Apligraf can go for up to 2 weeks so I am going to go ahead and reapply the total contact casting but we are going to leave the Apligraf in place. 2/14; patient presents for follow-up. T the left leg she has had the total contact cast and Apligraf for the past week. She has had no issues with this. T the o o right heel she has been using antibiotic ointment and Aquacel Ag. 2/21; patient presents for follow-up. We have been using Apligraf and [name] total contact cast to the left lower extremity. She is tolerated this well. Unfortunately she is not approved for any more Apligraf per insurance. She has been using antibiotic ointment and Aquacel Ag to the right foot. She has no issues or complaints  today. 2/28; patient presents for follow-up. We have been using Hydrofera Blue and antibiotic ointment under the total contact cast to the left lower extremity. He has been using Aquacel Ag with antibiotic ointment to the right plantar foot. She has no issues or complaints today. 3/6; patient presents for follow-up. She did not obtain her gentamicin ointment. She has been using Aquacel Ag to the right plantar foot wound. We have been using Hydrofera Blue with antibiotic ointment under the total contact cast to the left lower extremity. She has no issues or complaints today. 3/12; patient presents for follow-up. She has been using gentamicin ointment and Aquacel Ag to the right plantar foot wound. We have been using Hydrofera Blue with antibiotic ointment under the total contact cast on the left lower extremity foot wound. She has no issues or complaints today. Objective Constitutional TANAIRY, HOLDSWORTH [name] ([id number]) [id number] Page 18 of 22 Vitals Time Taken: 3:51 PM, Height: 69 in, Weight: 178 lbs, BMI: 26.3, Temperature: 98.5 F, Pulse: 80 bpm, Respiratory Rate: 16 breaths/min, Blood Pressure: 151/101 mmHg. General Notes: Patient c/o headache, she did not take her bp medication x 2 days Provider informed General Notes: Right foot: T the plantar heel there is an incision site with increased depth, No probing to bone. T the opening there is  Nonviable tissue and o o granulation tissue. Left foot: T the medial aspect there is an open wound with granulation tissue, Callus and nonviable tissue. No overt signs of infection to o any of the wound beds. Integumentary (Hair, Skin) Wound #11 status is Open. Original cause of wound was Surgical Injury. The date acquired was: 12/01/2021. The wound has been in treatment 41 weeks. The wound is located on the [place]. The wound measures 0.8cm length x 0.5cm width x 0.4cm depth; 0.314cm^2 area and 0.126cm^3 volume. There  is Fat Layer (Subcutaneous Tissue) exposed. There is no tunneling or undermining noted. There is [name] medium amount of serosanguineous drainage noted. The wound margin is distinct with the outline attached to the wound base. There is medium (34-66%) pale granulation within the wound bed. There is [name] medium (34- 66%) amount of necrotic tissue within the wound bed. Wound #12 status is Open. Original cause of wound was Pressure Injury. The date acquired was: 03/16/2020. The wound has been in treatment 41 weeks. The wound is located on the [place]. The wound measures 1cm length x 0.9cm width x 0.1cm depth; 0.707cm^2 area and 0.071cm^3 volume. There is Fat Layer (Subcutaneous Tissue) exposed. There is [name] medium amount of serous drainage noted. The wound margin is flat and intact. There is small (1-33%) red, pink granulation within the wound bed. There is [name] large (67-100%) amount of necrotic tissue within the wound bed including Adherent Slough. 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 I debrided nonviable tissue. I recommended continuing the course with antibiotic ointment and Hydrofera Blue to the left foot under the total contact cast and gentamicin ointment with Aquacel Ag to the right heel. Continue aggressive offloading with heel offloaded to the right foot. Follow-up in 1 week. Procedures Wound #11 Pre-procedure diagnosis of Wound #11 is an Open Surgical Wound located on the Right,Plantar Foot . There was [name] Selective/Open Wound Non-Viable Tissue Debridement with [name] total area of 0.4 sq cm performed by Kalman Shan, MD. With the following instrument(s): Curette to remove Viable and Non-Viable tissue/material. Material removed includes [place]. [name] time out was conducted at 04:24, prior to the start of the procedure. [name] Minimum amount of bleeding was controlled with Pressure. The procedure was tolerated well. Post Debridement Measurements: 0.8cm length x 0.5cm width x 0.4cm depth; 0.126cm^3 volume. Character of Wound/Ulcer Post Debridement is stable. Post procedure Diagnosis Wound #11: Same as Pre-Procedure Wound #12 Pre-procedure diagnosis of Wound #12 is [name] Diabetic Wound/Ulcer of the Lower Extremity located on the Left,Medial,Plantar Foot .Severity of Tissue Pre Debridement is: Fat layer exposed. There was [name] Excisional Skin/Subcutaneous Tissue Debridement with [name] total area of 25 sq cm performed by Kalman Shan, MD. With the following instrument(s): Curette to remove Viable and Non-Viable tissue/material. Material removed includes Callus, Subcutaneous Tissue, and Slough. [name] time out was conducted at 04:12, prior to the start of the procedure. [name] Minimum amount of bleeding was controlled with Pressure. The procedure was tolerated well. Post Debridement Measurements: 1cm length x 0.9cm width x 0.1cm depth; 0.071cm^3 volume. Character of Wound/Ulcer Post Debridement is stable. Severity of Tissue Post Debridement is: Fat layer exposed. Post procedure Diagnosis  Wound #12: Same as Pre-Procedure Pre-procedure diagnosis of Wound #12 is [name] Diabetic Wound/Ulcer of the Lower Extremity located on the Left,Medial,Plantar Foot . There was [name] T Contact otal Cast Procedure by Kalman Shan, MD. Post procedure Diagnosis Wound #12: Same as Pre-Procedure Plan Follow-up Appointments: Return Appointment in 1 week. Nurse Visit as needed Bathing/ Shower/ Hygiene: May shower with wound dressing protected with water repellent cover or cast protector. No tub bath. Anesthetic (Use 'Patient Medications' Section for  Anesthetic Order Entry): Lidocaine applied to wound bed Edema Control - Lymphedema / Segmental Compressive Device / Other: Elevate, Exercise Daily and Avoid Standing for Long Periods of Time. K[place], BALDUS [name] ([id number]) [id number] Page 19 of 22 Elevate legs to the level of the heart and pump ankles as often as possible Elevate leg(s) parallel to the floor when sitting. Off-Loading: T Contact Cast to Left Lower Extremity otal Open toe surgical shoe - Heel off loader - Right foot Other: - keep pressure off of feet. Additional Orders / Instructions: Follow Nutritious Diet and Increase Protein Intake Other: - Stay off of foot, wheelchair only Medications-Please add to medication list.: Wound #11 Right,Plantar Foot: Keystone Compound - use at home Topical Antibiotic - apply Gentamycin in office only WOUND #11: - Foot Wound Laterality: Plantar, Right Cleanser: Wound Cleanser 1 x Per Day/30 Days Discharge Instructions: Wash your hands with soap and water. Remove old dressing, discard into plastic bag and place into trash. Cleanse the wound with Wound Cleanser prior to applying [name] clean dressing using gauze sponges, not tissues or cotton balls. Do not scrub or use excessive force. Pat dry using gauze sponges, not tissue or cotton balls. Topical: Gentamicin 1 x Per Day/30 Days Discharge Instructions: Apply as directed by provider. in clinic Prim Dressing: Aquacel Extra Hydrofiber Dressing, 2x2 (in/in) 1 x Per Day/30 Days ary Secondary Dressing: ABD Pad 5x9 (in/in) (Generic) 1 x Per Day/30 Days Discharge Instructions: Cover with ABD pad Secured With: Medipore T - 25M Medipore H Soft Cloth Surgical T ape ape, 2x2 (in/yd) 1 x Per Day/30 Days Secured With: [employer] Sterile or Non-Sterile 6-ply 4.5x4 (yd/yd) 1 x Per Day/30 Days Discharge Instructions: Apply Kerlix as directed WOUND #12: - Foot Wound Laterality: Plantar, Left, Medial Cleanser: Wound Cleanser 1 x Per  Day/30 Days Discharge Instructions: Wash your hands with soap and water. Remove old dressing, discard into plastic bag and place into trash. Cleanse the wound with Wound Cleanser prior to applying [name] clean dressing using gauze sponges, not tissues or cotton balls. Do not scrub or use excessive force. Pat dry using gauze sponges, not tissue or cotton balls. Topical: Gentamicin 1 x Per Day/30 Days Discharge Instructions: Apply as directed by provider. in clinic Prim Dressing: Hydrofera Blue Ready Transfer Foam, 2.5x2.5 (in/in) 1 x Per Day/30 Days ary Discharge Instructions: Apply Hydrofera Blue Ready to wound bed as directed Prim Dressing: Silvercel 4 1/4x 4 1/4 (in/in) 1 x Per Day/30 Days ary Discharge Instructions: Apply to periwound Secondary Dressing: ABD Pad 5x9 (in/in) (Generic) 1 x Per Day/30 Days Discharge Instructions: Cover with ABD pad Secured With: Medipore T - 25M Medipore H Soft Cloth Surgical T ape ape, 2x2 (in/yd) 1 x Per Day/30 Days Secured With: Kerlix Roll Sterile or Non-Sterile 6-ply 4.5x4 (yd/yd) 1 x Per Day/30 Days Discharge Instructions: Apply Kerlix as directed 1. Admit for sharp debridement 2. Hydrofera Blue with antibiotic ointment under total contact cast to the left lower extremity 3. Gentamicin ointment with Aquacel Ag to the right  foot 4. Aggressive offloadingoooffloading heel shoe to the right foot 5. Follow-up in 1 week Electronic Signature(s) Signed: 01/03/2023 4:50:32 PM By: Kalman Shan DO Entered By: Kalman Shan on 01/03/2023 16:33:54 -------------------------------------------------------------------------------- ROS/PFSH Details Patient Name: Date of Service: Heather Hitch [name]. 01/03/2023 3:45 PM Medical Record Number: [id number] Patient Account Number: [id number] Date of Birth/Sex: Treating RN: 1976-07-15 (47 y.o. [name] Primary Care Provider: Raelene Bott Other Clinician: Massie Kluver Referring Provider: Treating  Provider/Extender: Eddie North in Treatment: [place] From Patient Ear/Nose/Mouth/Throat LATECHIA, SPERDUTO ([id number]) [id number] Page 20 of 22 Medical History: Positive for: Chronic sinus problems/congestion; Middle ear problems Hematologic/Lymphatic Medical History: Positive for: Anemia Respiratory Medical History: Positive for: Chronic Obstructive Pulmonary Disease (COPD) Cardiovascular Medical History: Positive for: Congestive Heart Failure - EF 33% Endocrine Medical History: Positive for: Type II Diabetes Time with diabetes: 14 years Treated with: Insulin, Oral agents Blood sugar tested every day: No Blood sugar testing results: Bedtime: 176 Genitourinary Medical History: Positive for: End Stage Renal Disease Integumentary (Skin) Medical History: Positive for: History of pressure wounds Neurologic Medical History: Positive for: Neuropathy - feel and legs HBO Extended History Items Ear/Nose/Mouth/Throat: Ear/Nose/Mouth/Throat: Chronic sinus Middle ear problems problems/congestion Immunizations Pneumococcal Vaccine: Received Pneumococcal Vaccination: No Implantable Devices No devices added Family and Social History Current every day smoker; Marital Status - Single; Alcohol Use: Never; Drug Use: Prior History; Caffeine Use: Daily Electronic Signature(s) Signed: 01/03/2023 4:50:32 PM By: Kalman Shan DO Signed: 01/04/2023 7:50:42 AM By: Gretta Cool, BSN, RN, CWS, [name] RN, BSN Entered By: Kalman Shan on 01/03/2023 16:42:34 KERIS, EBANKS [name] ([id number]) [id number] Page 21 of 22 -------------------------------------------------------------------------------- Total Contact Cast Details Patient Name: Date of Service: TANAYAH, RYER 01/03/2023 3:45 PM Medical Record Number: [id number] Patient Account Number: [id number] Date of Birth/Sex: Treating RN: [birth year] (47  y.o. [name] Primary Care Provider: Raelene Bott Other Clinician: Massie Kluver Referring Provider: Treating Provider/Extender: Eddie North in Treatment: [phone] T Contact Cast Applied for Wound Assessment: otal Wound #12 Left,Medial,Plantar Foot Performed By: Physician Kalman Shan, MD Post Procedure Diagnosis Same as Pre-procedure Electronic Signature(s) Signed: 01/03/2023 4:50:32 PM By: Kalman Shan DO Signed: 01/04/2023 11:54:14 AM By: Massie Kluver Entered By: Massie Kluver on 01/03/2023 16:24:22 -------------------------------------------------------------------------------- SuperBill Details Patient Name: Date of Service: Heather Hitch [name]. 01/03/2023 Medical Record Number: [id number] Patient Account Number: [id number] Date of Birth/Sex: Treating RN: [birth year] (47 y.o. [name], [name] Primary Care Provider: Raelene Bott Other Clinician: Massie Kluver Referring Provider: Treating Provider/Extender: Eddie North in Treatment: 41 Diagnosis Coding ICD-10 Codes Code Description [phone] Non-pressure chronic ulcer of other part of left foot with other specified severity 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.621 Type 2 diabetes mellitus with foot ulcer E11.42 Type 2 diabetes mellitus with diabetic polyneuropathy L97.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 of skin Facility Procedures : CPT4 Code: [id number] Description: [id number] - DEB SUBQ TISSUE 20 SQ CM/< ICD-10 Diagnosis Description 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i E11.621 Type 2 diabetes mellitus with foot ulcer Modifier: ty Quantity: 1 : Laker, Lajoyce [place] Code: [id number] RISTI [name] [id number] Description: [id number] - DEB SUBQ TISS EA ADDL 20CM 02) [id number] ICD-10 Diagnosis Description L97.528 Non-pressure chronic  ulcer of other part of left foot with other specified severity E11.621 Type 2 diabetes mellitus with foot ulcer Modifier: [id number]_[id number] Quantity:  1 17.pdf Page 22 of 22 : CPT4 Code: [id number] Description: [phone] - DEBRIDE WOUND 1ST 20 SQ CM OR < ICD-10 Diagnosis Description 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.621 Type 2 diabetes mellitus with foot ulcer Modifier: 1 Quantity: Physician Procedures : CPT4 Code Description Modifier [id number] - WC PHYS SUBQ TISS 20 SQ CM ICD-10 Diagnosis Description 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ity E11.621 Type 2 diabetes mellitus with foot ulcer Quantity: 1 : [id number] - WC PHYS SUBQ TISS EA ADDL 20 CM ICD-10 Diagnosis Description 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ity E11.621 Type 2 diabetes mellitus with foot ulcer Quantity: 1 : [id number] - WC PHYS DEBR WO ANESTH 20 SQ CM ICD-10 Diagnosis Description 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.621 Type 2 diabetes mellitus with foot ulcer Quantity: 1 Electronic Signature(s) Signed: 01/03/2023 4:50:32 PM By: Kalman Shan DO Entered By: Kalman Shan on 01/03/2023 16:39:33

## 2023-01-11 ENCOUNTER — Encounter (HOSPITAL_BASED_OUTPATIENT_CLINIC_OR_DEPARTMENT_OTHER): Payer: Medicaid Other | Admitting: Internal Medicine

## 2023-01-11 DIAGNOSIS — L97528 Non-pressure chronic ulcer of other part of left foot with other specified severity: Secondary | ICD-10-CM

## 2023-01-11 DIAGNOSIS — E11621 Type 2 diabetes mellitus with foot ulcer: Secondary | ICD-10-CM

## 2023-01-11 DIAGNOSIS — L97512 Non-pressure chronic ulcer of other part of right foot with fat layer exposed: Secondary | ICD-10-CM

## 2023-01-12 NOTE — Progress Notes (Signed)
[name] ([id number]) [id number] Page 1 of 11 Visit Report for 01/11/2023 Arrival Information Details Patient Name: Date of Service: [name], [name] 01/11/2023 11:00 [name]: [id number] Patient Account Number: [id number] Date of Birth/Sex: Treating RN: 1976/09/12 (47 y.o. [name] Primary Care Akul Leggette: Raelene Bott Other Clinician: Massie Kluver Referring Dlynn Ranes: Treating Zaelyn Noack/Extender: Eddie North in Treatment: 4 Visit Information History Since Last Visit All ordered tests and consults were completed: No Patient Arrived: Wheel Chair Added or deleted any medications: No Arrival Time: 11:03 Any new allergies or adverse reactions: No Transfer Assistance: EasyPivot Patient Lift Had [name] fall or experienced change in No Patient Identification Verified: Yes activities of daily living that may affect Secondary Verification Process Completed: Yes risk of falls: Patient Requires Transmission-Based Precautions: No Signs or symptoms of abuse/neglect since No Patient Has Alerts: No last visito Hospitalized since last visit: No Implantable device outside of the clinic No excluding cellular tissue based products placed in the center since last visit: Has Dressing in Place as Prescribed: Yes Has Footwear/Offloading in Place as Yes Prescribed: Left: T Contact Cast otal Right: Surgical Shoe with Pressure Relief Insole Pain Present Now: No Electronic Signature(s) Signed: 01/11/2023 5:18:06 PM By: Massie Kluver Entered By: Massie Kluver on 01/11/2023 11:03:52 -------------------------------------------------------------------------------- Clinic Level of Care Assessment Details Patient Name: Date of Service: [name] 01/11/2023 11:00 [name]: [id number] Patient Account Number: [id number] Date of Birth/Sex: Treating RN: June 08, 1976 (47 y.o. [name] Primary Care  Uzoma [name]: Raelene Bott Other Clinician: Massie Kluver Referring Oris Staffieri: Treating Leeann Bady/Extender: Eddie North in Treatment: [facility] Level of Care Assessment Items [name], BOGACZ [name] ([id number]) [id number] Page 2 of 11 TOOL 1 Quantity Score []  - 0 Use when EandM and Procedure is performed on INITIAL visit ASSESSMENTS - Nursing Assessment / Reassessment []  - 0 General Physical Exam (combine w/ comprehensive assessment (listed just below) when performed on new pt. evals) []  - 0 Comprehensive Assessment (HX, ROS, Risk Assessments, Wounds Hx, etc.) ASSESSMENTS - Wound and Skin Assessment / Reassessment []  - 0 Dermatologic / Skin Assessment (not related to wound area) ASSESSMENTS - Ostomy and/or Continence Assessment and Care []  - 0 Incontinence Assessment and Management []  - 0 Ostomy Care Assessment and Management (repouching, etc.) PROCESS - Coordination of Care []  - 0 Simple Patient / Family Education for ongoing care []  - 0 Complex (extensive) Patient / Family Education for ongoing care []  - 0 Staff obtains Programmer, systems, Records, T Results / Process Orders est []  - 0 Staff telephones HHA, Nursing Homes / Clarify orders / etc []  - 0 Routine Transfer to another Facility (non-emergent condition) []  - 0 Routine Hospital Admission (non-emergent condition) []  - 0 New Admissions / Biomedical engineer / Ordering NPWT Apligraf, etc. , []  - 0 Emergency Hospital Admission (emergent condition) PROCESS - Special Needs []  - 0 Pediatric / Minor Patient Management []  - 0 Isolation Patient Management []  - 0 Hearing / Language / Visual special needs []  - 0 Assessment of Community assistance (transportation, D/C planning, etc.) []  - 0 Additional assistance / Altered mentation []  - 0 Support Surface(s) Assessment (bed, cushion, seat, etc.) INTERVENTIONS - Miscellaneous []  - 0 External ear exam []  - 0 Patient Transfer  (multiple staff / Civil Service fast streamer / Similar devices) []  - 0 Simple Staple / Suture removal (25 or less) []  - 0 Complex Staple / Suture removal (26 or more) []  - 0 Hypo/Hyperglycemic Management (do  not check if billed separately) []  - 0 Ankle / Brachial Index (ABI) - do not check if billed separately Has the patient been seen at the hospital within the last three years: Yes Total Score: 0 Level Of Care: ____ Electronic Signature(s) Signed: 01/11/2023 5:18:06 PM By: Massie Kluver Entered By: Massie Kluver on 01/11/2023 11:29:06 [name] ([id number].pdf Page 3 of 11 -------------------------------------------------------------------------------- Encounter Discharge Information Details Patient Name: Date of Service: [name] 01/11/2023 11:00 [name]: [id number] Patient Account Number: [id number] Date of Birth/Sex: Treating RN: 1976-03-09 (47 y.o. [name] Primary Care Leaf [employer]: Raelene Bott Other Clinician: Massie Kluver Referring Zamyra Allensworth: Treating Malarie Tappen/Extender: Eddie North in Treatment: 99 Encounter Discharge Information Items Post Procedure Vitals Discharge Condition: Stable Temperature (F): 98.4 Ambulatory Status: Wheelchair Pulse (bpm): 66 Discharge Destination: Home Respiratory Rate (breaths/min): 16 Transportation: Private Auto Blood Pressure (mmHg): 121/81 Accompanied By: mother Schedule Follow-up Appointment: Yes Clinical Summary of Care: Electronic Signature(s) Signed: 01/11/2023 5:18:06 PM By: Massie Kluver Entered By: Massie Kluver on 01/11/2023 11:54:38 -------------------------------------------------------------------------------- Lower Extremity Assessment Details Patient Name: Date of Service: [name] 01/11/2023 11:00 [name]: [id number] Patient Account Number: [id number] Date of Birth/Sex: Treating RN: [birth year] (47 y.o. [name] Primary Care Ariya Bohannon: Raelene Bott Other Clinician: Massie Kluver Referring Hadden Steig: Treating Bhavya Grand/Extender: Eddie North in Treatment: 53 Electronic Signature(s) Signed: 01/11/2023 5:15:10 PM By: Gretta Cool BSN, RN, CWS, [name] RN, BSN Signed: 01/11/2023 5:18:06 PM By: Massie Kluver Entered By: Massie Kluver on 01/11/2023 11:19:04 -------------------------------------------------------------------------------- Multi Wound Chart Details Patient Name: Date of Service: [name] 01/11/2023 11:00 [name]: [id number] Patient Account Number: [id number] Date of Birth/Sex: Treating RN: Apr 09, 1976 (47 y.o. [name] Primary Care Trystian Crisanto: Raelene Bott Other Clinician: Massie Kluver Referring Magaly Pollina: Treating Leaira Fullam/Extender: Linna Hoff Weeks in Treatment: [address] Vital Signs WELTHA, HORAK [name] ([id number]) [id number] Page 4 of 11 Height(in): 69 Pulse(bpm): 66 Weight(lbs): 178 Blood Pressure(mmHg): 121/81 Body Mass Index(BMI): 26.3 Temperature(F): 98.4 Respiratory Rate(breaths/min): 16 [11:Photos:] [N/[name]:N/[name]] Right, Plantar Foot Left, Medial, Plantar Foot N/[name] Wound Location: Surgical Injury Pressure Injury N/[name] Wounding Event: Open Surgical Wound 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: Extremity Chronic sinus problems/congestion, Chronic sinus problems/congestion, N/[name] Comorbid History: Middle ear problems, Anemia, Chronic Middle ear problems, Anemia, Chronic Obstructive Pulmonary Disease Obstructive Pulmonary Disease (COPD), Congestive Heart Failure, (COPD), Congestive Heart Failure, Type II Diabetes, End Stage Renal Type II Diabetes, End Stage Renal Disease, History of pressure wounds, Disease, History of pressure wounds, Neuropathy Neuropathy 12/01/2021 03/16/2020 N/[name] Date Acquired: 7 52 N/[name] Weeks of Treatment: Open Open N/[name] Wound Status: No No  N/[name] Wound Recurrence: 1x0.7x0.5 1x0.6x0.1 N/[name] Measurements L x W x D (cm) 0.55 0.471 N/[name] [name] (cm) : rea 0.275 0.047 N/[name] Volume (cm) : 70.20% 90.00% N/[name] % Reduction in Area: 83.40% 95.00% N/[name] % Reduction in Volume: Full Thickness Without Exposed Grade 3 N/[name] Classification: Support Structures Medium Medium N/[name] Exudate Amount: Serosanguineous Serous N/[name] Exudate Type: red, brown amber N/[name] Exudate Color: Distinct, outline attached Flat and Intact N/[name] Wound Margin: Medium (34-66%) Small (1-33%) N/[name] Granulation Amount: Pale Red, Pink N/[name] Granulation Quality: Medium (34-66%) Large (67-100%) N/[name] Necrotic Amount: Fat Layer (Subcutaneous Tissue): Yes Fat Layer (Subcutaneous Tissue): Yes N/[name] Exposed Structures: Fascia: No Fascia: No Tendon: No Tendon: No Muscle: No Muscle: No Joint: No Joint: No Bone: No Bone: No Medium (34-66%) None N/[name] Epithelialization: Treatment Notes  Electronic Signature(s) Signed: 01/11/2023 5:18:06 PM By: Massie Kluver Entered By: Massie Kluver on 01/11/2023 11:19:09 -------------------------------------------------------------------------------- Multi-Disciplinary Care Plan Details Patient Name: Date of Service: [name] 01/11/2023 11:00 [name]: [id number] Patient Account Number: [id number] ARMONI, CARTE ([id number]) [phone].pdf Page 5 of 11 Date of Birth/Sex: Treating RN: 12/02/1975 (47 y.o. [name] Primary Care Tahnee Cifuentes: Other Clinician: Glade Nurse Referring Jahara Dail: Treating Puneet Masoner/Extender: Eddie North in Treatment: 36 Active Inactive Abuse / Safety / Falls / Self Care Management Nursing Diagnoses: History of Falls Potential for falls Potential for injury related to falls Goals: Patient will not develop complications from immobility Date Initiated: 03/17/2022 Date Inactivated: 04/20/2022 Target Resolution Date:  03/16/2022 Goal Status: Met Patient/caregiver will verbalize understanding of skin care regimen Date Initiated: 03/17/2022 Date Inactivated: 09/28/2022 Target Resolution Date: 03/16/2022 Goal Status: Met Patient/caregiver will verbalize/demonstrate measure taken to improve self care Date Initiated: 03/17/2022 Target Resolution Date: 03/16/2022 Goal Status: Active Interventions: Assess fall risk on admission and as needed Provide education on basic hygiene Provide education on personal and home safety Notes: Necrotic Tissue Nursing Diagnoses: Impaired tissue integrity related to necrotic/devitalized tissue Knowledge deficit related to management of necrotic/devitalized tissue Goals: Necrotic/devitalized tissue will be minimized in the wound bed Date Initiated: 03/17/2022 Target Resolution Date: 10/26/2022 Goal Status: Active Patient/caregiver will verbalize understanding of reason and process for debridement of necrotic tissue Date Initiated: 03/17/2022 Date Inactivated: 09/28/2022 Target Resolution Date: 03/16/2022 Goal Status: Met Interventions: Provide education on necrotic tissue and debridement process Treatment Activities: Apply topical anesthetic as ordered : 03/16/2022 Excisional debridement : 03/16/2022 Notes: Osteomyelitis Nursing Diagnoses: Infection: osteomyelitis Knowledge deficit related to disease process and management Goals: Patient/caregiver will verbalize understanding of disease process and disease management Date Initiated: 03/17/2022 Target Resolution Date: 03/16/2022 Goal Status: Active Patient's osteomyelitis will resolve Date Initiated: 03/17/2022 Target Resolution Date: 10/26/2022 Goal Status: Active Signs and symptoms for osteomyelitis will be recognized and promptly addressed Date Initiated: 03/17/2022 Target Resolution Date: 10/26/2022 Goal Status: Active Interventions: Assess for signs and symptoms of osteomyelitis resolution every visit Provide education  on osteomyelitis CHRISTNA, MODGLIN [name] ([id number]) [phone].pdf Page 6 of 11 Treatment Activities: Systemic antibiotics : 03/16/2022 Notes: Patient on antibiotics. Pressure Nursing Diagnoses: Knowledge deficit related to management of pressures ulcers Potential for impaired tissue integrity related to pressure, friction, moisture, and shear Goals: Patient will remain free from development of additional pressure ulcers Date Initiated: 11/02/2022 Target Resolution Date: 11/09/2022 Goal Status: Active Patient/caregiver will verbalize risk factors for pressure ulcer development Date Initiated: 11/02/2022 Target Resolution Date: 11/02/2022 Goal Status: Active Patient/caregiver will verbalize understanding of pressure ulcer management Date Initiated: 11/02/2022 Target Resolution Date: 11/02/2022 Goal Status: Active Interventions: Assess: immobility, friction, shearing, incontinence upon admission and as needed Provide education on pressure ulcers Notes: Electronic Signature(s) Signed: 01/11/2023 5:15:10 PM By: Gretta Cool, BSN, RN, CWS, [name] RN, BSN Signed: 01/11/2023 5:18:06 PM By: Massie Kluver Entered By: Massie Kluver on 01/11/2023 11:29:21 -------------------------------------------------------------------------------- Pain Assessment Details Patient Name: Date of Service: [name] 01/11/2023 11:00 [name]: [id number] Patient Account Number: [id number] Date of Birth/Sex: Treating RN: 1976/08/11 (47 y.o. [name] Primary Care Jamira Barfuss: Raelene Bott Other Clinician: Massie Kluver Referring Abhay Godbolt: Treating Draper Gallon/Extender: Eddie North in Treatment: 4 Active Problems Location of Pain Severity and Description of Pain Patient Has Paino No Site Locations [place], [place] [name] ([id number]) [id number] Page 7 of 11 Pain Management and Medication Current Pain Management: Electronic  Signature(s) Signed: 01/11/2023 5:15:10 PM By: Gretta Cool, BSN, RN, CWS, [name] RN, BSN Signed: 01/11/2023 5:18:06 PM By: Massie Kluver Entered By: Massie Kluver on 01/11/2023 11:06:56 -------------------------------------------------------------------------------- Patient/Caregiver Education Details Patient Name: Date of Service: [name], Seraiah [name]. 3/20/2024andnbsp11:00 [place] Record Number: [id number] Patient Account Number: [id number] Date of Birth/Gender: Treating RN: September 30, 1976 (47 y.o. [name] Primary Care Physician: Raelene Bott Other Clinician: Massie Kluver Referring Physician: Treating Physician/Extender: Eddie North in Treatment: 44 Education Assessment Education Provided To: Patient and Caregiver Education Topics Provided Wound/Skin Impairment: Handouts: Other: continue wound care as directed Methods: Explain/Verbal Responses: State content correctly Electronic Signature(s) Signed: 01/11/2023 5:18:06 PM By: Massie Kluver Entered By: Massie Kluver on 01/11/2023 11:29:42 [name] ([id number] Page 8 of 11 -------------------------------------------------------------------------------- Wound Assessment Details Patient Name: Date of Service: ARRYN, DEWAR 01/11/2023 11:00 [name]: [id number] Patient Account Number: [id number] Date of Birth/Sex: Treating RN: February 14, 1976 (47 y.o. [name], [name] Primary Care Rainie Crenshaw: Raelene Bott Other Clinician: Massie Kluver Referring Raekwon Winkowski: Treating Kye Silverstein/Extender: Eddie North in Treatment: 43 Wound Status Wound Number: 11 Primary Open Surgical Wound Etiology: Wound Location: Right, Plantar Foot Wound Open Wounding Event: Surgical Injury Status: Date Acquired: 12/01/2021 Comorbid Chronic sinus problems/congestion, Middle ear problems, Anemia, Weeks Of Treatment: 43 History: Chronic Obstructive  Pulmonary Disease (COPD), Congestive Heart Clustered Wound: No Failure, Type II Diabetes, End Stage Renal Disease, History of pressure wounds, Neuropathy Photos Wound Measurements Length: (cm) 1 Width: (cm) 0.7 Depth: (cm) 0.5 Area: (cm) 0.55 Volume: (cm) 0.275 % Reduction in Area: 70.2% % Reduction in Volume: 83.4% Epithelialization: Medium (34-66%) Tunneling: No Undermining: No Wound Description Classification: Full Thickness Without Exposed Suppo Wound Margin: Distinct, outline attached Exudate Amount: Medium Exudate Type: Serosanguineous Exudate Color: red, brown rt Structures Foul Odor After Cleansing: No Slough/Fibrino Yes Wound Bed Granulation Amount: Medium (34-66%) Exposed Structure Granulation Quality: Pale Fascia Exposed: No Necrotic Amount: Medium (34-66%) Fat Layer (Subcutaneous Tissue) Exposed: Yes Tendon Exposed: No Muscle Exposed: No Joint Exposed: No Bone Exposed: No Treatment Notes Wound #11 (Foot) Wound Laterality: Plantar, Right Cleanser Wound Cleanser Discharge Instruction: Wash your hands with soap and water. Remove old dressing, discard into plastic bag and place into trash. Cleanse the JASLYNNE, WYKA [name] ([id number]) [id number] Page 9 of 11 wound with Wound Cleanser prior to applying [name] clean dressing using gauze sponges, not tissues or cotton balls. Do not scrub or use excessive force. Pat dry using gauze sponges, not tissue or cotton balls. Peri-Wound Care Topical Gentamicin Discharge Instruction: Apply as directed by Criag Wicklund. in clinic Primary Dressing Aquacel Extra Hydrofiber Dressing, 2x2 (in/in) Secondary Dressing ABD Pad 5x9 (in/in) Discharge Instruction: Cover with ABD pad Secured With [place] Surgical T ape ape, 2x2 (in/yd) Kerlix Roll Sterile or Non-Sterile 6-ply 4.5x4 (yd/yd) Discharge Instruction: Apply Kerlix as directed Compression Wrap Compression  Stockings Add-Ons Electronic Signature(s) Signed: 01/11/2023 5:15:10 PM By: Gretta Cool, BSN, RN, CWS, [name] RN, BSN Signed: 01/11/2023 5:18:06 PM By: Massie Kluver Entered By: Massie Kluver on 01/11/2023 11:18:20 -------------------------------------------------------------------------------- Wound Assessment Details Patient Name: Date of Service: [name] 01/11/2023 11:00 [name]: [id number] Patient Account Number: [id number] Date of Birth/Sex: Treating RN: 1976/03/23 (47 y.o. [name] Primary Care Quintrell Baze: Raelene Bott Other Clinician: Massie Kluver Referring Iktan Aikman: Treating Sherissa Tenenbaum/Extender: Eddie North in Treatment: 70 Wound Status Wound Number: 12 Primary Diabetic Wound/Ulcer of the Lower Extremity  Etiology: Wound Location: Left, Medial, Plantar Foot Wound Open Wounding Event: Pressure Injury Status: Date Acquired: 03/16/2020 Comorbid Chronic sinus problems/congestion, Middle ear problems, Anemia, Weeks Of Treatment: 43 History: Chronic Obstructive Pulmonary Disease (COPD), Congestive Heart Clustered Wound: No Failure, Type II Diabetes, End Stage Renal Disease, History of pressure wounds, Neuropathy Photos DORISSA, LONZO [name] ([id number]) [phone].pdf Page 10 of 11 Wound Measurements Length: (cm) 1 Width: (cm) 0.6 Depth: (cm) 0.1 Area: (cm) 0.471 Volume: (cm) 0.047 % Reduction in Area: 90% % Reduction in Volume: 95% Epithelialization: None Tunneling: No Undermining: No Wound Description Classification: Grade 3 Wound Margin: Flat and Intact Exudate Amount: Medium Exudate Type: Serous Exudate Color: amber Foul Odor After Cleansing: No Slough/Fibrino No Wound Bed Granulation Amount: Small (1-33%) Exposed Structure Granulation Quality: Red, Pink Fascia Exposed: No Necrotic Amount: Large (67-100%) Fat Layer (Subcutaneous Tissue) Exposed: Yes Necrotic Quality: Adherent  Slough Tendon Exposed: No Muscle Exposed: No Joint Exposed: No Bone Exposed: No Treatment Notes Wound #12 (Foot) Wound Laterality: Plantar, Left, Medial Cleanser Wound Cleanser Discharge Instruction: Wash your hands with soap and water. Remove old dressing, discard into plastic bag and place into trash. Cleanse the wound with Wound Cleanser prior to applying [name] clean dressing using gauze sponges, not tissues or cotton balls. Do not scrub or use excessive force. Pat dry using gauze sponges, not tissue or cotton balls. Peri-Wound Care Topical Gentamicin Discharge Instruction: Apply as directed by Kortnie Stovall. in clinic Primary Dressing Hydrofera Blue Ready Transfer Foam, 2.5x2.5 (in/in) Discharge Instruction: Apply Hydrofera Blue Ready to wound bed as directed [place] 4 1/4x 4 1/4 (in/in) Discharge Instruction: Apply to periwound Secondary Dressing ABD Pad 5x9 (in/in) Discharge Instruction: Cover with ABD pad Secured With [place] H Soft Cloth Surgical T ape ape, 2x2 (in/yd) Kerlix Roll Sterile or Non-Sterile 6-ply 4.5x4 (yd/yd) Discharge Instruction: Apply Kerlix as directed Compression Wrap Compression Stockings Add-Ons Electronic Signature(s) Signed: 01/11/2023 5:15:10 PM By: Gretta Cool, BSN, RN, CWS, [name] RN, BSN Signed: 01/11/2023 5:18:06 PM By: Massie Kluver Entered By: Massie Kluver on 01/11/2023 11:18:52 [name] ([id number]) [id number] Page 11 of 11 -------------------------------------------------------------------------------- Vitals Details Patient Name: Date of Service: BRELAND, KILCREASE 01/11/2023 11:00 [name]: [id number] Patient Account Number: [id number] Date of Birth/Sex: Treating RN: August 23, 1976 (47 y.o. [name], [name] Primary Care Fawn Desrocher: Raelene Bott Other Clinician: Massie Kluver Referring Matayah Reyburn: Treating Tauriel Scronce/Extender: Eddie North in Treatment: 27 Vital  Signs Time Taken: 11:04 Temperature (F): 98.4 Height (in): 69 Pulse (bpm): 66 Weight (lbs): 178 Respiratory Rate (breaths/min): 16 Body Mass Index (BMI): 26.3 Blood Pressure (mmHg): 121/81 Reference Range: 80 - 120 mg / dl Electronic Signature(s) Signed: 01/11/2023 5:18:06 PM By: Massie Kluver Entered By: Massie Kluver on 01/11/2023 11:06:51

## 2023-01-12 NOTE — Progress Notes (Signed)
TAMIRRA, JANNEY [name] ([id number]) [id number] Page 1 of 22 Visit Report for 01/11/2023 Chief Complaint Document Details Patient Name: Date of Service: [name], [name] 01/11/2023 11:00 [name]: [id number] Patient Account Number: [id number] Date of Birth/Sex: Treating RN: 14-Sep-1976 (47 y.o. [name] Primary Care Provider: Raelene Bott Other Clinician: Massie Kluver Referring Provider: Treating Provider/Extender: Eddie North in Treatment: 74 Information Obtained from: Patient Chief Complaint 03/19/2021; patient referred by Dr. Luana Shu who has been looking after her left foot for quite [name] period of time for review of [name] nonhealing area in the left midfoot 03/12/2022; bilateral feet wounds and right lower extremity wound. Electronic Signature(s) Signed: 01/11/2023 2:38:02 PM By: Kalman Shan DO Entered By: Kalman Shan on 01/11/2023 12:24:10 -------------------------------------------------------------------------------- Debridement Details Patient Name: Date of Service: Heather Hitch [name]. 01/11/2023 11:00 [name]: [id number] Patient Account Number: [id number] Date of Birth/Sex: Treating RN: Feb 22, 1976 (47 y.o. [name], [name] Primary Care Provider: Raelene Bott Other Clinician: Massie Kluver Referring Provider: Treating Provider/Extender: Eddie North in Treatment: 43 Debridement Performed for Assessment: Wound #12 Left,Medial,Plantar Foot Performed By: Physician Kalman Shan, MD Debridement Type: Debridement Severity of Tissue Pre Debridement: Fat layer exposed Level of Consciousness (Pre-procedure): Awake and Alert Pre-procedure Verification/Time Out Yes - 11:25 Taken: Start Time: 11:25 T Area Debrided (L x W): otal 1.5 (cm) x 1 (cm) = 1.5 (cm) Tissue and other material debrided: Viable, Non-Viable, Callus, Slough, Subcutaneous, Slough Level:  Skin/Subcutaneous Tissue Debridement Description: Excisional Instrument: Curette Bleeding: Minimum Hemostasis Achieved: Pressure Response to Treatment: Procedure was tolerated well Level of Consciousness (Post- Awake and Alert procedure): [name], [name] ([id number] Page 2 of 22 Post Debridement Measurements of Total Wound Length: (cm) 1 Width: (cm) 0.6 Depth: (cm) 0.1 Volume: (cm) 0.047 Character of Wound/Ulcer Post Debridement: Stable Severity of Tissue Post Debridement: Fat layer exposed Post Procedure Diagnosis Same as Pre-procedure Electronic Signature(s) Signed: 01/11/2023 2:38:02 PM By: Kalman Shan DO Signed: 01/11/2023 5:15:10 PM By: Gretta Cool, BSN, RN, CWS, [name] RN, BSN Signed: 01/11/2023 5:18:06 PM By: Massie Kluver Entered By: Massie Kluver on 01/11/2023 11:26:07 -------------------------------------------------------------------------------- Debridement Details Patient Name: Date of Service: Heather Hitch [name]. 01/11/2023 11:00 [name]: [id number] Patient Account Number: [id number] Date of Birth/Sex: Treating RN: May 15, 1976 (47 y.o. [name] Primary Care Provider: Raelene Bott Other Clinician: Massie Kluver Referring Provider: Treating Provider/Extender: Eddie North in Treatment: 43 Debridement Performed for Assessment: Wound #11 Right,Plantar Foot Performed By: Physician Kalman Shan, MD Debridement Type: Debridement Level of Consciousness (Pre-procedure): Awake and Alert Pre-procedure Verification/Time Out Yes - 11:27 Taken: Start Time: 11:27 T Area Debrided (L x W): otal 1 (cm) x 0.7 (cm) = 0.7 (cm) Tissue and other material debrided: Viable, Non-Viable, Slough, Slough Level: Non-Viable Tissue Debridement Description: Selective/Open Wound Instrument: Curette Bleeding: Minimum Hemostasis Achieved: Pressure Response to Treatment: Procedure was tolerated  well Level of Consciousness (Post- Awake and Alert procedure): Post Debridement Measurements of Total Wound Length: (cm) 1 Width: (cm) 0.7 Depth: (cm) 0.5 Volume: (cm) 0.275 Character of Wound/Ulcer Post Debridement: Stable Post Procedure Diagnosis Same as Pre-procedure Electronic Signature(s) Signed: 01/11/2023 2:38:02 PM By: Kalman Shan DO Signed: 01/11/2023 5:15:10 PM By: Gretta Cool, BSN, RN, CWS, [name] RN, BSN Signed: 01/11/2023 5:18:06 PM By: Massie Kluver Entered By: Massie Kluver on 01/11/2023 11:28:14 [name] ([id number]) [id number] Page 3 of 22 -------------------------------------------------------------------------------- HPI Details Patient Name: Date of Service: [name], [name] 01/11/2023  11:00 [name]: [id number] Patient Account Number: [id number] Date of Birth/Sex: Treating RN: 1976-09-17 (47 y.o. [name] Primary Care Provider: Raelene Bott Other Clinician: Massie Kluver Referring Provider: Treating Provider/Extender: Eddie North in Treatment: 50 History of Present Illness HPI Description: 01/18/18-She is here for initial evaluation of the left great toe ulcer. She is [name] poor historian in regards to timeframe in detail. She states approximately 4 weeks ago she lacerated her toe on something in the house. She followed up with her primary care who placed her on Bactrim and ultimately [name] second dose of Bactrim prior to coming to wound clinic. She states she has been treating the toe with peroxide, Betadine and [name] Band-Aid. She did not check her blood sugar this morning but checked it yesterday morning it was 327; she is unaware of [name] recent A1c and there are no current records. She saw Dr. she would've orthopedics last week for an old injury to the left ankle, she states he did not see her toe, nor did she bring it to his attention. She smokes approximately 1 pack cigarettes [name] day. Her  social situation is concerning, she arrives this morning with her mother who appears extremely intoxicated/under the influence; her mother was asked to leave the room and be monitored by the patient's grandmother. The patient's aunt then accompanied the patient and the room throughout the rest of the appointment. We had [name] lengthy discussion regarding the deleterious effects of uncontrolled hyperglycemia and smoking as it relates to wound healing and overall health. She was strongly encouraged to decrease her smoking and get her diabetes under better control. She states she is currently on [name] diet and has cut down her [facility] consumption. The left toe is erythematous, macerated and slightly edematous with malodor present. The edema in her left foot is below her baseline, there is no erythema streaking. We will treat her with Santyl, doxycycline; we have ordered and xray, culture and provided [name] Peg assist surgical shoe and cultured the wound. 01/25/18-She is here in follow-up evaluation for [name] left great toe ulcer and presents with an abscess to her suprapubic area. She states her blood sugars remain elevated, feeling "sick" and if levels are below 250, but she is trying. She has made no attempt to decrease her smoking stating that we "can't take away her food in her cigarettes". She has been compliant with offloading using the PEG assist you. She is using Santyl daily. the culture obtained last week grew staph aureus and Enterococcus faecalis; continues on the doxycycline and Augmentin was added on Monday. The suprapubic area has erythema, no femoral variation, purple discoloration, minimal induration, was accessed with [name] cotton tip applicator with sanguinopurulent drainage, this was cultured, I suspect the current antibiotic treatment will cover and we will not add anything to her current treatment plan. She was advised to go to urgent care or ER with any change in redness, induration or  fever. 02/01/18-She is here in follow-up evaluation for left great toe ulcers and [name] new abdominal abscess from last week. She was able to use packing until earlier this week, where she "forgot it was there". She states she was feeling ill with GI symptoms last week and was not taking her antibiotic. She states her glucose levels have been predominantly less than 200, with occasional levels between 200-250. She thinks this was contributing to her GI symptoms as they have resolved without intervention. There continues to be significant laceration  to left toe, otherwise it clinically looks stable/improved. There is now less superficial opening to the lateral aspect of the great toe that was residual blister. We will transition to [facility] to all wounds, she will continue her Augmentin. If there is no change or deterioration next week for reculture. 02/08/18-She is here in follow-up evaluation for left great toe ulcer and abdominal ulcer. There is an improvement in both wounds. She has been wrapping her left toe with coban, not by our direction, which has created an area of discoloration to the medial aspect; she has been advised to NOT use coban secondary to her neuropathy. She states her glucose levels have been high over this last week ranging from 200-350, she continues to smoke. She admits to being less compliant with her offloading shoe. We will continue with same treatment plan and she will follow-up next week. 02/15/18-She is here in follow-up evaluation for left great toe ulcer and abdominal ulcer. The abdominal ulcer is epithelialized. The left great toe ulcer is improved and all injury from last week using the Coban wrap is resolved, the lateral ulcer is healed. She admits to noncompliance with wearing offloading shoe and admits to glucose levels being greater than 300 most of the week. She continues to smoke and expresses no desire to quit. There is one area medially that probes deeper than  it has historically, erythema to the toe and dorsal foot has consistently waxed and waned. There is no overt signs of cellulitis or infection but we will culture the wound for any occult infection given the new area of depth and erythema. We will hold off on sensitivities for initiation of antibiotic therapy. 02/22/18-She is here in follow up evaluation for left great toe ulcer. There is overall significant improvement in both wound appearance, erythema and edema with changes made last week. She was not initiated on antibiotic therapy. Culture obtained last week showed oxacillin sensitive staph aureus, sensitive to clindamycin. Clindamycin has been called into the pharmacy but she has been instructed to hold off on initiation secondary to overall clinical improvement and her history of antibiotic intolerance. She has been instructed to contact the clinic with any noted changes/deterioration and the wound, erythema, edema and/or pain. She will follow-up next week. She continues to smoke and her glucose levels remain elevated >250; she admits to compliance with offloading shoe 03/01/18 on evaluation today patient appears to be doing fairly well in regard to her left first toe ulcer. She has been tolerating the dressing changes with the [facility] Dressing without complication and overall this has definitely showed signs of improvement according to records as well is what the patient tells me today. I'm very pleased in that regard. She is having no pain today 03/08/18 She is here for follow up evaluation of [name] left great toe ulcer. She remains non-compliant with glucose control and smoking cessation; glucose levels consistently >200. She states that she got new shoe inserts/peg assist. She admits to compliance with offloading. Since my last evaluation there is significant improvement. We will switch to prisma at this time and she will follow up next week. She is noted to be tachycardic at this appointment,  heart rate 120s; she has [name] history of heart rate 70-130 according to our records. She admits to extreme agitation r/t personal issues; she was advised to monitor her heartrate and contact her physician if it does not return to [name] more normal range (<100). She takes cardizem twice daily. 03/15/18-She is here in  follow-up evaluation for left great toe ulcer. She remains noncompliant with glucose control and smoking cessation. She admits to compliance with wearing offloading shoe. The ulcer is improved/stable and we will continue with the same treatment plan and she will follow-up next week 03/22/18-She is here for evaluation for left great toe ulcer. There continues to be significant improvement despite recurrent hyperglycemia (over 500 yesterday) and she continues to smoke. She has been compliant with offloading and we will continue with same treatment plan and she will follow-up next week. 03/29/18-She is here for evaluation for left great toe ulcer. Despite continuing to smoke and uncontrolled diabetes she continues to improve. She is compliant with offloading shoe. We will continue with the same treatment plan and she will follow-up next week 04/05/18- She is here in follow up evaluation for [name] left great toe ulcer; she presents with small pustule to left fifth toe (resembles ant bite). She admits to compliance with wearing offloading shoe; continues to smoke or have uncontrolled blood glucose control. There is more callus than usual with evidence of bleeding; she denies known trauma. 04/12/18-She is here for evaluation of left great toe ulcer. Despite noncompliance with glycemic control and smoking she continues to make improvement. She continues to wear offloading shoe. The pustule, that was identified last week, to the left fifth toe is resolved. She will follow-up in 2 weeks 05/03/18-she is seen in follow-up evaluation for [name] left great toe ulcer. She is compliant with offloading, otherwise noncompliant  with glycemic control and [name], [name] ([id number]) [id number].pdf Page 4 of 22 smoking. She has plateaued and there is minimal improvement noted. We will transition to [facility], replaced the insert to her surgical shoe and she will follow-up in one week 05/10/18- She is here in follow up evaluation for [name] left great toe ulcer. It appears stable despite measurement change. We will continue with same treatment plan and follow up next week. 05/24/18-She is seen in follow-up evaluation for [name] left great toe ulcer. She remains compliant with offloading, has made significant improvement in her diet, decreasing the amount of sugar/soda. She said her recent A1c was 10.9 which is lower than. She did see [name] diabetic nutritionist/educator yesterday. She continues to smoke. We will continue with the same treatment plan and she'll follow-up next week. 05/31/18- She is seen in follow-up evaluation for left great toe ulcer. She continues to remain compliant with offloading, continues to make improvement in her diet, increasing her water and decreasing the amount of sugar/soda. She does continue to smoke with no desire to quit. We will apply Prisma to the depth and Hydrofera Blue over. We have not received insurance authorization for oasis. She will follow up next week. 06/07/18-She is seen in follow-up evaluation for left great toe ulcer. It has stalled according to today's measurements although base appears stable. She says she saw [name] diabetic educator yesterday; her average blood sugars are less than 300 which is an improvement for her. She continues to smoke and states "that's my next step" She continues with water over soda. We will order for xray, culture and reinstate ace wrap compression prior to placing apligraf for next week. She is voicing no complaints or concerns. Her dressing will change to iodoflex over the next week in preparation for apligraf. 06/14/18-She is seen in  follow-up evaluation for left great toe ulcer. Plain film x-ray performed last week was negative for osteomyelitis. Wound culture obtained last week grew strep B and OSSA; she is initiated on  keflex and cefdinir today; there is erythema to the toe which could be from ace wrap compression, she has [name] history of wrapping too tight and has has been encouraged to maintain ace wraps that we place today. We will hold off on application of apligraf today, will apply next week after antibiotic therapy has been initiated. She admits today that she has resumed taking [name] shower with her foot/toe submerged in water, she has been reminded to keep foot/toe out of the bath water. She will be seen in follow up next week 06/21/18-she is seen in follow-up evaluation for left great toe ulcer. She is tolerating antibiotic therapy with no GI disturbance. The wound is stable. Apligraf was applied today. She has been decreasing her smoking, only had 4 cigarettes yesterday and 1 today. She continues being more compliant in diabetic diet. She will follow-up next week for evaluation of site, if stable will remove at 2 weeks. 06/28/18- She is here in follow up evalution. Apligraf was placed last week, she states the dressing fell off on Tuesday and she was dressing with hydrofera blue. She is healed and will be discharged from the clinic today. She has been instructed to continue with smoking cessation, continue monitoring glucose levels, offloading for an additional 4 weeks and continue with hydrofera blue for additional two weeks for any possible microscopic opening. Readmission: 08/07/18 on evaluation today patient presents for reevaluation concerning the ulcer of her right great toe. She was previously discharged on 06/28/18 healed. Nonetheless she states that this began to show signs of drainage she subsequently went to her primary care provider. Subsequently an x-ray was performed on 08/01/18 which was negative. The patient was  also placed on antibiotics at that time. Fortunately they should have been effective for the infection. Nonetheless she's been experiencing some improvement but still has [name] lot of drainage coming from the wound itself. 08/14/18 on evaluation today patient's wound actually does show signs of improvement in regard to the erythema at this point. She has completed the antibiotics. With that being said we did discuss the possibility of placing her in [name] total contact cast as of today although I think that I may want to give this just [name] little bit more time to ensure nothing recurrence as far as her infection is concerned. I do not want to put in the cast and risk infection at that time if things are not completely resolved. With that being said she is gonna require some debridement today. 08/21/18 on evaluation today patient actually appears to be doing okay in regard to her toe ulcer. She's been tolerating the dressing changes without complication. With that being said it does appear that she is ready and in fact I think it's appropriate for [place] to go ahead and initiate the total contact cast today. Nonetheless she will require some sharp debridement to prepare the wound for application. Overall I feel like things have been progressing well but we do need to do something to get this to close more readily. 08/24/18 patient seen today for reevaluation after having had the total contact cast applied on Tuesday. She seems to have done very well the wound appears to be doing great and overall I'm pleased with the progress that she's made. There were no abnormal areas of rubbing from the cast on her lower extremity. 08/30/18 on evaluation today patient actually appears to be completely healed in regard to her plantar toe ulcer. She tells me at this point she's been having [name]  lot of issues with the cast. She almost fell [name] couple of times the state shall the step of her dog [name] couple times as well. This is been [name] very  frustrating process for her other nonetheless she has completely healed the wound which is excellent news. Overall there does not appear to be the evidence of infection at this time which is great news. 09/11/18 evaluation today patient presents for follow-up concerning her great toe ulcer on the left which has unfortunately reopened since I last saw her which was only [name] couple of weeks ago. Unfortunately she was not able to get in to get the shoe and potentially the AFO that's gonna be necessary due to her left foot drop. She continues with offloading shoe but this is not enough to prevent her from reopening it appears. When we last had her in the total contact cast she did well from [name] healing standpoint but unfortunately the wound reopened as soon as she came out of the cast within just [name] couple of weeks. 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 This is also part of the reason why she falls on [name] regular basis. I just do not believe that is gonna be safe for [place] to reinitiate the total contact cast as last time we had this on she fell 3 times one week which is definitely not normal for her. 09/18/18 upon evaluation today the patient actually appears to be doing about the same in regard to her toe ulcer. She did not contact [facility] as I asked her to even though I had given her the prescription. In fact she actually states that she has no idea where the prescription is. She did apparently call [facility] and they told her that all she needed to do was bring the prescription in order to be able to be seen and work on getting the AFO for her left foot. With all that being said she still does not have an appointment and I'm not sure were things stand that regard. I will give her [name] new prescription today in order  to contact them to get this set up. 09/25/18 on evaluation today patient actually appears to be doing about the same in regard to her toes ulcer. She does have [name] small areas which seems to have [name] lot of callous buildup around the edge of the wound which is going to need sharp debridement today. She still is waiting to be scheduled for evaluation with [facility] for possibility of an AFO. She states there supposed to call her tomorrow to get this set up. Unfortunately it does appear that her foot specifically the toe area is showing signs of erythema. There does not appear to be any systemic infection which is in these good news. 10/02/18 on evaluation today patient actually appears to be doing about the same in regard to her toe ulcer. This really has not done too well although it's not significantly larger it's also not significantly smaller. She has been tolerating the dressing changes without complication. She actually has her appointment with [facility] and [place] tomorrow to hopefully be measured for obtaining and AFO splint. I think this would be helpful preventing this from reoccurring. We had contemplated starting the cast this week although to be honest I  am reluctant to do that as she's been having nausea, vomiting, and seizure activity over the past three days. She has [name] history of seizures and have been told is nothing that can be done for these. With that being said I do believe that along with the seizures have the nausea vomiting which upon further questioning doesn't seem to be the normal for her and makes me concerned for the possibility of infection or something else going on. I discussed this with the patient and her mother during the office visit today. I do not feel the wound is effective but maybe something else. The responses this was "this just happens to her at times and we don't know why". They did not seem to be interested in going to the hospital to have this checked out  further. 10/09/18 on evaluation today patient presents for follow-up concerning her ongoing toe ulcer. She has been tolerating the dressing changes without complication. Fortunately there does not appear to be any evidence of infection which is great news however I do think that the patient would benefit from going ahead for with the total contact cast. She's actually in [name] wheelchair today she tells me that she will use her walker if we initiate the cast. I was very specific about the fact that if we were gonna do the cast I wanted to make sure that she was using the walker in order to prevent any falls. She tells me she does not have stairs that she has to traverse on [name] regular basis at her home. She has not had any seizures since last week again that something that happens to her often she tells me she did talk to [employer] and they said that it may take up to three weeks to get the brace approved for her. Hopefully that will not take that long but nonetheless in the meantime I do think the cast could be of benefit. 10/12/18 on evaluation today patient appears to be doing rather well in regard to her toe ulcer. It's just been [name] few days and already this is significantly improved both as far as overall appearance and size. Fortunately there's no sign of infection. She is here for her first obligatory cast change. [name], [name] ([id number]) [id number] Page 5 of 22 10/19/18 Seen today for follow up and management of left great toe ulcer. Wound continues to show improvement. Noted small open area with seroussang drainage with palpation. Denies any increased pain or recent fevers during visit. She will continue calcium alginate with offloading shoe. Denies any questions or concerns during visit. 10/26/18 on evaluation today patient appears to be doing about the same as when I last saw her in regard to her wound bed. Fortunately there does not appear to be any signs of  infection. Unfortunately she continues to have [name] breakdown in regard to the toe region any time that she is not in the cast. It takes almost no time at all for this to happen. Nonetheless she still has not heard anything from the brace being made by [employer] as to when exactly this will be available to her. Fortunately there is no signs of infection at this time. 10/30/18 on evaluation today patient presents for application of the total contact cast as we just received him this morning. Fortunately we are gonna be able to apply this to her today which is great news. She continues to have no significant pain which is good news. Overall I do feel like things have  been improving while she was the cast is when she doesn't have [name] cast that things get worse. She still has not really heard anything from [place] regarding her brace. 11/02/18 upon evaluation today patient's wound already appears to be doing significantly better which is good news. Fortunately there does not appear to be any signs of infection also good news. Overall I do think the total contact cast as before is helping to heal this area unfortunately it's just not gonna likely keep the area closed and healed without her getting her brace at least. Again the foot drop is [name] significant issue for her. 11/09/18 on evaluation today patient appears to be doing excellent in regard to her toe ulcer which in fact is completely healed. Fortunately we finally got the situation squared away with the paperwork which was needed to proceed with getting her brace approved by Medicaid. I have filled that out unfortunately that information has been sent to the orthopedic office that I worked at 2 1/2 years ago and not tired Current wound care measures. Fortunately she seems to be doing very well at this time. 11/23/18 on evaluation today patient appears to be doing More Poorly Compared to Last Time I Saw Her. At [facility] She Had Completely Healed. Currently she is  continuing to have issues with reopening. She states that she just found out that the brace was approved through Medicaid now she just has to go get measured in order to have this fitted for her and then made. Subsequently she does not have an appointment for this yet that is going to complicate things we obviously cannot put her back in the cast if we do not have everything measured because they're not gonna be able to measure her foot while she is in the cast. 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 Obviously this is unfortunate and does have me somewhat worried as well. 11/30/18 on evaluation today patient's toe ulcer actually appears to be doing fairly well. The good news is she will be getting her brace in the shoes next week on Wednesday. Hopefully we will be able to get this to heal without having to go back in the cast however she may need the cast in order to get the wound completely heal and then go from there. Fortunately there's no signs of infection at this time. 12/07/18 on evaluation today patient fortunately did receive her brace and she states she could tell this definitely makes her walk better. With that being said she's been having issues with her toe where she noticed yesterday there was [name] lot of tissue that was loosing off this appears to be much larger than what it was previous. She also states that her leg has been read putting much across the top of her foot just about the ankle although this seems to be receiving somewhat. The total area is still red and appears to be someone infected as best I can tell. She is previously taken Bactrim and that may be [name] good option for her today as well. We are gonna see what I wound culture shows as well and I think that this is definitely appropriate. With that being said outside of the culture I still need to initiate something in the interim  and that's what I'm gonna go ahead and select Bactrim is [name] good option for her. 12/14/18 on evaluation  today patient appears to be doing better in regard to her left great toe ulcer as compared to last week's evaluation. There's still some erythema although this is significantly improved which is excellent news. Overall I do believe that she is making good progress is still gonna take some time before she is where I would like her to be from the standpoint of being able to place her back into the total contact cast. Hopefully we will be where we need to be by next week. 12/21/18 on evaluation today patient actually appears to be doing poorly in regard to her toe ulcer. She's been tolerating the dressing changes without complication. Fortunately there's no signs of systemic infection although she does have [name] lot of drainage from the toe ulcer and this does seem to be causing some issues at this point. She does have erythema on the distal portion of her toe that appears to be likely cellulitis. 12/28/18 on evaluation today patient actually appears to be doing [name] little better in my pinion in regard to her toe ulcer. With that being said she still does have some evidence of infection at this time and for her culture she had both E. coli as well as enterococcus as organisms noted on evaluation. For that reason I think that though the Keflex likely has treated the E. coli rather well this has really done nothing for the enterococcus. We are going to have to initiate treatment for this specifically. 01/04/19 on evaluation today patient's toe actually appears to be doing better from the standpoint of infection. She currently would like to see about putting the cash back on I think that this is appropriate as long as she takes care of it and keeps it from getting wet. She is gonna have some drainage we can definitely pass this up with Drawtex and alginate to try to prevent as much drainage as possible from causing  the problems. With that being said I do want to at least try her with the cast between now and Tuesday. If there any issues we can't continue to use it then I will discontinue the use of the cast at that point. 01/08/19 on evaluation today patient actually appears to be doing very well as far as her foot ulcer specifically the great toe on the left is concerned. She did have an area of rubbing on the medial aspect of her left ankle which again is from the cast. Fortunately there's no signs of infection at this point in this appears to be [name] very slight skin breakdown. The patient tells me she felt it rubbing but didn't think it was that bad. Fortunately there is no signs of active infection at this time which is good news. No fevers, chills, nausea, or vomiting noted at this time. 01/15/19 on evaluation today patient actually appears to be doing well in regard to her toe ulcer. Again as previous she seems to do well and she has the cast on which indicates to me that during the time she doesn't have [name] cast on she's putting way too much pressure on this region. Obviously I think that's gonna be an issue as with the current national emergency concerning the Covid-19 Virus it has been recommended that we discontinue the use of total contact casting by the chief medical officer of our company, Dr. Simona Huh. The reasoning is that if [name] patient becomes sick and cannot come into have the cast removed they could not just leave this on for an additional  two weeks. Obviously the hospitals also do not want to receive patient's who are sick into the emergency department to potentially contaminate the region and spread the Covid-19 Virus among other sick individuals within the hospital system. Therefore at this point we are suspending the use of total contact cast until the current emergency subsides. This was all discussed with the patient today as well. 01/22/19 on evaluation today patient's wound on her left great toe  appears to be doing slightly worse than previously noted last week. She tells me that she has been on this quite [name] bit in fact she tells me she's been awake for 38 straight hours. This is due to the fact that she's having to care for grandparents because nobody else will. She has been taking care of them for five the last seven days since I've seen her they both have dementia his is from [name] stroke and her grandmother's was progressive. Nonetheless she states even her mom who knows her condition and situation has only help two of those days to take care of them she's been taking care of the rest. Fortunately there does not appear to be any signs of active infection in regard to her toe at this point although obviously it doesn't look as good as it did previous. I think this is directly related to her not taking off the pressure and friction by way of taking things easy. Though I completely understand what's going on. 01/29/19 on evaluation today patient's tools are actually appears to be showing some signs of improvement today compared to last week's evaluation as far as not necessarily the overall size of the wound but the fact that she has some new skin growth in between the two ends of the wound opening. Overall I feel like she has done well she states that she had [name] family member give her what sounds to be [name] CAM walker boot which has been helpful as well. 02/05/19 on evaluation today patient's wound bed actually appears to be doing significantly better in regard to her overall appearance of the size of the wound. With that being said she is still having an issue with offloading efficiently enough to get this to close. Apparently there is some signs of infection at this point as well unfortunately. Previously she's done well of Augmentin I really do not see anything that needs to be culture currently but there theme and cellulitis of the foot that I'm seeing I'm gonna go ahead and place her on an  antibiotic today to try to help clear this up. 02/12/2019 on evaluation today patient actually appears to be doing poorly in regard to her overall wound status. She tells me she has been using her offloading shoe but actually comes in today wearing her tennis shoe with the AFO brace. Again as I previously discussed with her this is really not sufficient to allow the area to heal appropriately. Nonetheless she continues to be somewhat noncompliant and I do wonder based on what she has told my nurse in the past as to MELLODY, FLORENTINE [name] ([id number]) [id number] Page 6 of 22 whether or not [name] good portion of this noncompliance may be recreational drug and alcohol related. She has had [name] history of heroin overdose and this was fairly recently in the past couple of months that have been seeing her. Nonetheless overall I feel like her wound looks significantly worse today compared to what it was previous. She still has significant erythema despite the Augmentin I am  not sure that this is an appropriate medication for her infection I am also concerned that the infection may have gone down into her bone. 02/19/19 on evaluation today patient actually appears to be doing about the same in regard to her toe ulcer. Unfortunately she continues to show signs of bone exposure and infection at this point. There does not appear to be any evidence of worsening of the infection but I'm also not really sure that it's getting significantly better. She is on the Augmentin which should be sufficient for the Staphylococcus aureus infection that she has at this point. With that being said she may need IV antibiotics to more appropriately treat this. We did have [name] discussion today about hyperbaric option therapy. 02/28/19 on evaluation today patient actually appears to be doing much worse in regard to the wound on her left great toe as compared to even my previous evaluation last week. Unfortunately this seems  to be training in [name] pretty poor direction. Her toe was actually now starting to angle laterally and I can actually see the entire joint area of the proximal portion of the digit where is the distal portion of the digit again is no longer even in contact with the joint line. Unfortunately there's [name] lot more necrotic tissue around the edge and the toe appears to be showing signs of becoming gangrenous in my pinion. I'm very concerned about were things stand at this point. She did see infectious disease and they are planning to send in [name] prescription for Sivextro for her and apparently this has been approved. With that being said I don't think she should avoid taking this but at the same time I'm not sure that it's gonna be sufficient to save her toe at this point. She tells me that she still having to care for grandparents which I think is putting quite [name] bit of strain on her foot and specifically the total area and has caused this to break down even to [name] greater degree than would've otherwise been expected. 03/05/19 on evaluation today patient actually appears to be doing quite well in regard to her toe all things considering. She still has bone exposed but there appears to be much less your thing on overall the appearance of the wound and the toe itself is dramatically improved. She still does have some issues currently obviously with infection she did see vascular as well and there concerned that her blood flow to the toad. For that reason they are setting up for an angiogram next week. 03/14/19 on evaluation today patient appears to be doing very poor in regard to her toe and specifically in regard to the ulceration and the fact that she's starting to notice the toe was leaning even more towards the lateral aspect and the complete joint is visible on the proximal aspect of the joint. Nonetheless she's also noted [name] significant odor and the tip of the toe is turning more dark and necrotic appearing.  Overall I think she is getting worse not better as far as this is concerned. For that reason I am recommending at this point that she likely needs to be seen for likely amputation. READMISSION 03/19/2021 This is [name] patient that we cared for in this clinic for [name] prolonged period of time in 2019 and 2020 with [name] left foot and left first toe wound. 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 Luana Shu. In December 2021 she had an ulcer on her right great toe as well as the fourth and fifth toes. 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 She also had an angiogram at that time and underwent angioplasty of the right anterior tibial artery. In any case she claims that the wound on the right foot is closed I did not look at this today which was probably an oversight although I think that should be done next week. 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 According to Dr. Deborra Medina last review that she was out of the area being cared for by another physician but recently came back to his attention. 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 [name] culture of this area showed E. coli apparently before she came back to see Dr. Luana Shu she was supposed to be receiving antibiotics but she did not really take them. Nor is she offloading this area at all. Finally her last hemoglobin A1c listed in epic was in March 2022 at 14.1 she says things are [name] lot better since then although I am not sure. 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 She had this wound at the time. During this admission she had E. coli sepsis [name] TEE was negative. Past medical history is extensive and includes type 2 diabetes with peripheral neuropathy cardiomyopathy with  an ejection fraction of 33%, hypertension, hyperlipidemia chronic renal failure stage III history of substance abuse with cocaine although she claims to be clean now verified by her mother. She is still [name] heavy cigarette smoker. She has [name] history of bipolar disorder seizure disorder ABI in our clinic was 1.05 6/1; left midfoot in the setting of [name] TMA done previously. Round circular wound with [name] "knuckle" of protruding tissue. The problem is that the knuckle was not attached to any of the surrounding granulation and this probed proximally widely I removed [name] large portion of this tissue. This wound goes with considerable undermining laterally. I do not feel any bone there was no purulence but this is [name] deep wound. 6/8; in spite of the debridement I did last week. She arrives with [name] wound looking exactly the same. [name] protruding "knuckle" of tissue nonadherent to most of the surrounding tissue. There is considerable depth around this from 6-12 o'clock at 2.7 cm and undermining of 1 cm. This does not look overtly infected and the x- ray I did last week was negative for any osseous abnormalities. We have been using silver collagen 6/15; deep tissue culture I did last week showed moderate staph aureus and moderate Pseudomonas. This will definitely require prolonged antibiotic therapy. The pathology on the protuberant area was negative for malignancy fungus etc. 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 We have been using silver collagen. I am going to be prescribing Levaquin for 2 weeks. Her CT scan of the foot is down for 7/5 6/22; CT scan of the foot on 7 5. She says she has hardware in the left leg from her previous fracture. She is on the Levaquin for the deep tissue culture I did that showed methicillin sensitive staph aureus and Pseudomonas. I gave her [name] 2-week supply and she will have another week. She arrives in clinic today with the same  protuberant tissue however this is nonadherent  to the tissue surrounding it. I am really at [name] loss to explain this unless there is underlying deep tissue infection 6/29; patient presents for 1 week follow-up. She has been using collagen to the wound bed. She reports taking her antibiotics as prescribed.She has no complaints or issues today. She denies signs of infection. 7/6; patient presents for one week followup. She has been using collagen to the wound bed. She states she is taking Levaquin however at times she is not able to keep it down. She denies signs of infection. 7/13; patient presents for 1 week follow-up. She has been using silver alginate to the wound bed. She still has nausea when taking her antibiotics. She denies signs of infection. 7/20; patient presents for 1 week follow-up. She has been using silver alginate with gentamicin cream to the wound bed. She denies any issues and has no complaints today. She denies signs of infection. 7/27; patient presents for 1 week follow-up. She continues to use silver alginate with gentamicin cream to the wound bed. She reports starting her antibiotics. She has no issues or complaints. Overall she reports stability to the wound. 8/3; patient presents for 1 week follow-up. She has been using silver alginate with gentamicin cream to the wound bed. She reports completing all antibiotics. She has no issues or complaints today. She denies signs of infection. 8/17; patient presents for 2-week follow-up. He is to use silver alginate to the wound bed. She has no issues or complaints today. She denies signs of infection. She reports her pain has improved in her foot since last clinic visit 8/24; patient presents for 1 week follow-up. She continues to use silver alginate to the wound bed. She has no issues or complaints. She denies signs of infection. Pain is stable. 9/7; patient presents for follow-up. She missed her last week appointment due to feeling  ill. She continues to use silver alginate. She has [name] new wound to the right lower extremity that is covered in eschar. She states It occurred over the past week and has no idea how it started. She currently denies signs of infection. 9/14; patient presents for follow-up. T the left foot wound she has been using gentamicin cream and silver alginate. T the right lower extremity wound she has o o been keeping this covered and has not obtain Santyl. 9/21; patient presents for follow-up. She reports using gentamicin cream and silver alginate to the left foot and Santyl to the right lower extremity wound. She has no issues or complaints today. She denies signs of infection. 9/28; patient presents for follow-up. She reports [name] new wound to her right heel. She states this occurred [name] few days ago and is progressively gotten worse. She has been trying to clean the area with [name] Q-tip and Santyl. She reports stability in the other 2 wounds. She has been using gentamicin cream and silver alginate to the left foot and Santyl to the right lower extremity wound. [name], [name] ([id number]) [id number] Page 7 of 22 10/12; patient presents for follow-up. She reports improvement to the wound beds. She is seeing vein and vascular to discuss the potential of [name] left BKA. She states they are going to do an arteriogram. She continues to use silver alginate with dressing changes to her wounds. 11/2; patient presents for follow-up. She states she has not been doing dressing changes to the wound beds. She states she is not able to offload the areas. She reports chronic pain to her left foot wound. 11/9;  patient presents for follow-up. She came in with only socks on. She states she forgot to put on shoes. It is unclear if she is doing any dressing changes. She currently denies systemic signs of infection. 11/16; patient presents for follow-up. She came again only with socks on. She states she does  not wear shoes ever. It is unclear if she does dressing changes. She currently denies systemic signs of infection. 11/23; patient presents for follow-up. She wore her shoes today. It still unclear exactly what dressing she is using for each wound but she did states she obtained Dakin's solution and has been using this to the left foot wound. She currently denies signs of infection. 11/30; patient presents for follow-up. She has no issues or complaints today. She currently denies signs of infection. 12/7; patient presents for follow-up. She has no issues or complaints today. She has been using Hydrofera Blue to the right heel wound and Dakin solution to the left foot wound. Her right anterior leg wound is healed. She currently denies signs of infection. 12/14; patient presents for follow-up. She has been using Hydrofera Blue to the right heel and Dakin's to the left foot wounds. She has no issues or complaints today. She denies signs of infection. 12/21; patient presents for follow-up. She reports using Hydrofera Blue to the right heel and Dakin's to the left foot wound. She denies signs of infection. 12/28; patient presents for follow-up. She continues to use Dakin's to the left foot wound and Hydrofera Blue to the right heel wound. She denies signs of infection. 1/4; patient presents for follow-up. She has no issues or complaints today. She denies signs of infection. 1/11; patient presents for follow-up. It is unclear if she has been dressing these wounds over the past week. She currently denies signs of infection. 1/18; patient presents for follow-up. She states she has been using Dakin's wet-to-dry dressings to the left foot. She has been using Hydrofera Blue to the right foot foot wound. She states that the anterior right leg wound has reopened and draining serous fluid. She denies signs of infection. 1/25; patient presents for follow-up. She has no issues or complaints today. 2/1; patient  presents for follow-up. She has no issues or complaints today. She denies signs of infection. 2/8; patient presents for follow-up. She has lost her surgical shoes. She did not have [name] dressing to the right heel wound. She currently denies signs of infection. 2/15; patient presents for follow-up. She reports more pain to the right heel today. She denies purulent drainage Or fever/chills 2/22; patient presents for follow-up. She reports taking clindamycin over the past week. She states that she continues to have pain to her right heel. She reports purulent drainage. Readmission 03/16/2022 Ms. Rasheedah Limback is [name] 47 year old female with [name] past medical history of type 2 diabetes, osteomyelitis to her feet, chronic systolic heart failure and bipolar disorder that presents to the clinic for bilateral feet wounds and right lower extremity wound. She was last seen in our clinic on 12/15/2021. At that time she had purulent drainage coming out of her right plantar foot and I recommended she go to the ED. She states she went to [facility] and has been there for the past 3 months. I cannot see the records. She states she had OR debridement and was on several weeks of IV antibiotics while inpatient. Since discharge she has not been taking care of the wound beds. She had nothing on her feet other than socks today.  She currently denies signs of infection. 5/31; patient presents for follow-up. She has been using Dakin's wet-to-dry dressings to the wound beds on her feet bilaterally and antibiotic ointment to the right anterior leg wound. She had [name] wound culture done at last clinic visit that showed moderate Pseudomonas aeruginosa sensitive to ciprofloxacin. She currently denies systemic signs of infection. 6/14; patient presents for follow-up. She received Keystone 5 days ago and has been using this on the wound beds. She states that last week she had to go to the hospital because she had increased warmth  and erythema to the right foot. She was started on 2 oral antibiotics. She states she has been taking these. She currently denies systemic signs of infection. She has no issues or complaints today. 6/21; patient presents for follow-up. She states she has been using Keystone antibiotics to the wound beds. She has no issues or complaints today. She denies signs of infection. 6/28; patient presents for follow-up. She has been using Keystone antibiotics to the wound beds. She has no issues or complaints today. 7/12; patient presents for follow-up. Has been using Keystone antibiotics to the wound beds with calcium alginate. She has no issues or complaints today. She never followed up with her orthopedic surgeon who did the OR debridement to the right foot. We discussed the total contact cast for the left foot and patient would like to do this next week. 7/19; patient presents for follow-up. She has been using Keystone antibiotics with calcium alginate to the wound beds. She has no issues or complaints today. Patient is in agreement to do the total contact cast of the left foot today. She knows to return later this week for the obligatory cast change. 05-13-2022 upon evaluation today patient's wound which she has the cast of the left leg actually appears to be doing significantly better. Fortunately I do not see any signs of active infection locally or systemically which is great news and overall I am extremely pleased with where we stand currently. 7/26; patient presents for follow-up. She has [name] cast in place for the past week. She states it irritated her shin. Other than that she tolerated the cast well. She states she would like [name] break for 1 week from the cast. We have been using Keystone antibiotic and Aquacel to both wound beds. She denies signs of infection. 8/2; patient presents for follow-up. She has been using Keystone and Aquacel to the wound beds. She denies any issues and has no complaints. She  is agreeable to have the cast placed today for the left leg. 06-03-2022 upon evaluation today patient appears to be doing well with regard to her wound she saw some signs of improvement which is great news. Fortunately I do not see any evidence of active infection locally or systemically at this time which is great news. No fevers, chills, nausea, vomiting, or diarrhea. 8/16; patient presents for follow-up. She has no issues or complaints today. We have been using Keystone and Aquacel to the wound beds. The left lower extremity is in [name] total contact cast. She is tolerated this well. 8/23; patient presents for follow-up. She has had the total contact cast on the left leg for the past week. Unfortunately this has rubbed and broken down the Saintvil, Sally-Anne [name] ([id number]) [phone].pdf Page 8 of 22 skin to the medial foot. She currently denies signs of infection. She has been using Keystone antibiotic to the right plantar foot wound. 8/30; patient presents for follow-up. We have held  off on the total contact cast for the left leg for the past week. Her wound on the left foot has improved and the previous surrounding breakdown of skin has epithelialized. She has been using Keystone antibiotic to both wound beds. She has no issues or complaints today. She denies signs of infection. 9/6; patient presents for follow-up. She has ordered her's Keystone antibiotic and this is arriving this week. She has been doing Dakin's wet-to-dry dressings to the wound beds. She denies signs of infection. She is agreeable to the total contact cast today. 9/13; patient presents for follow-up. She states that the cast caused her left leg shin to be sore. She would like to take [name] break from the cast this week. She has been using Keystone antibiotic to the right plantar foot wound. She denies signs of infection. 9/20; patient presents for follow-up. She has been using Keystone antibiotics to the wound  beds with calcium alginate to the right foot wound and Hydrofera Blue to the left foot wound. She is agreeable to having the cast placed today. She has been approved for Apligraf and we will order this for next clinic visit. 9/27; patient presents for follow-up. We have been using Keystone antibiotic with Hydrofera Blue to the left foot wound under [name] total contact cast. T the right o foot wound she has been using Keystone antibiotic and calcium alginate. She declines [name] total contact cast today. Apligraf is available for placement and she would like to proceed with this. 07-28-2022 upon evaluation today patient appears to be doing well currently in regard to her wound. She is actually showing signs of significant improvement which is great news. Fortunately I do not see any evidence of active infection locally nor systemically at this time. She has been seeing Dr. Heber Adams and to be honest has been doing very well with the cast. Subsequently she comes in today with [name] cast on and we did reapply that today as well. She did not really want to she try to talk me out of that but I explained that if she wanted to heal this is really the right way to go. Patient voiced understanding. In regard to her right foot this is actually [name] lot better compared to the last time I saw her which is also great news. 10/11; patient presents for follow-up. Apligraf and the total contact cast was placed to the left leg at last clinic visit. She states that her right foot wound had burning pain to it with the placement of Apligraf to this area. She has been doing Dora over this area. She denies signs of infection including increased warmth, erythema or purulent drainage. 11/1; 3-week follow-up. The patient fortunately did not have [name] total contact cast or an Apligraf and on the left foot. She has been using Keystone ABD pads and kerlix and her own running shoes She arrives in clinic today with thick callus and [name] very poor  surface on the left foot on the right nonviable skin subcutaneous tissue and [name] deep probing hole. 11/15; patient missed her last clinic appointment. She states she has not been dressing the wound beds for the past 2 weeks. She states that at she had [name] new roommate but is now going back to live with her mother. Apparently its been [name] distracting 2 weeks. Patient currently denies signs of infection. 11/22; patient presents for follow-up. She states she has been using Keystone antibiotic and Dakin's wet-to-dry dressings to the wound beds. She is agreeable for  cast placement today. We had ordered Apligraf however this has not been received by our facility. 11/29; Patient had [name] total contact cast placed at last clinic visit and she tolerated this well. We were using silver alginate under the cast. Patient's been using Keystone antibiotic with Aquacel to the right plantar foot wound. She has no issues or complaints today. Apligraf is available for placement today. Patient would like to proceed with this. 12/6; patient presents for follow-up. She had Apligraf placed in standard fashion last clinic visit under the total contact cast to the left lower extremity. She has been using Keystone antibiotic and Aquacel to the right plantar foot wound. She has no issues or complaints today. 12/13; patient presents for follow-up. She has finished 5 Apligraf placements. Was told she would not qualify for more. We have been doing [name] total contact cast to the left lower extremity. She has been using Keystone antibiotic and Aquacel to the right plantar foot wound. She has no issues or complaints today. 12/20; patient presents for follow-up. We have been using Hydrofera Blue with Keystone antibiotic under [name] total contact cast of the left lower extremity. She reports using Keystone antibiotic and silver alginate to the right heel wound. She has no issues or complaints today. 12/27; patient presents with [name] healthy wound on the  left midfoot. We have Apligraf to apply that to that more also using [name] total contact cast. On the right we are using Keystone and silver alginate. She is offloading the right heel with [name] surgical shoealthough by her admission she is on her feet quite [name] bit 1/3; patient presents for follow-up. Apligraf was placed to the wound beds last clinic visit. She was placed in [name] total contact cast to the left lower extremity. She declines [name] total contact cast today. She states that her mother is in the hospital and she cannot adequately get around with the cast on. 1/10; patient presents for follow-up. She declined the total contact cast at last clinic visit. Both wounds have declined in appearance. She states that she has been on her feet and not offloading the wound beds. She currently denies signs of infection. 1/17; patient presents for follow-up. She had the total contact cast along with Apligraf placed last week to the left lower extremity. She tolerated this well. She has been using Aquacel Ag and Keystone antibiotic to the right heel wound. She currently denies signs of infection. She has no issues or complaints today. 1/24; patient presents for follow-up. We have been using Apligraf to the left foot wound along with [name] total contact cast. She has done well with this. T the right o heel wound she has been using Aquacel Ag and Keystone antibiotic ointment. She has no issues or complaints today. She denies signs of infection. 1/31; patient presents for follow-up. We have been using Apligraf to the left foot wound along with the total contact cast. She continues to do well with this. To the right heel we have been using Aquacel Ag and Keystone antibiotic ointment. She has no issues or complaints today. 12-01-2022 upon evaluation patient is seen today on my schedule due to the fact that she unfortunately was in the hospital yesterday. Her cast needed to come off the only reason she is out of the hospital is due  to the fact that they would not take it off at the hospital which is somewhat bewildered reading to me to be perfectly honest. I am not certain why this was but  either way she was released and then was placed on my schedule today in order to get this off and reapply the total contact casting as appropriate. I do not have an Apligraf for her today it was applied last week and today's actually expired yesterday as there was some scheduling conflicts with her being in the hospital. Nonetheless we do not have that for reapplication today but the good news that she is not draining too much and the Apligraf can go for up to 2 weeks so I am going to go ahead and reapply the total contact casting but we are going to leave the Apligraf in place. 2/14; patient presents for follow-up. T the left leg she has had the total contact cast and Apligraf for the past week. She has had no issues with this. T the o o right heel she has been using antibiotic ointment and Aquacel Ag. 2/21; patient presents for follow-up. We have been using Apligraf and [name] total contact cast to the left lower extremity. She is tolerated this well. Unfortunately she is not approved for any more Apligraf per insurance. She has been using antibiotic ointment and Aquacel Ag to the right foot. She has no issues or complaints today. 2/28; patient presents for follow-up. We have been using Hydrofera Blue and antibiotic ointment under the total contact cast to the left lower extremity. He has been using Aquacel Ag with antibiotic ointment to the right plantar foot. She has no issues or complaints today. 3/6; patient presents for follow-up. She did not obtain her gentamicin ointment. She has been using Aquacel Ag to the right plantar foot wound. We have been using Hydrofera Blue with antibiotic ointment under the total contact cast to the left lower extremity. She has no issues or complaints today. 3/12; patient presents for follow-up. She has been  using gentamicin ointment and Aquacel Ag to the right plantar foot wound. We have been using Loreli Benzinger, Cameran [name] ([id number]) [id number] Page 9 of 22 Blue with antibiotic ointment under the total contact cast on the left lower extremity foot wound. She has no issues or complaints today. 3/20; patient presents for follow-up. She has been using gentamicin ointment and Aquacel Ag to the right plantar foot wound. We have been using Hydrofera Blue with antibiotic ointment under the total contact cast to the left lower extremity wound. She has no issues or complaints today. Electronic Signature(s) Signed: 01/11/2023 2:38:02 PM By: Kalman Shan DO Entered By: Kalman Shan on 01/11/2023 12:24:39 -------------------------------------------------------------------------------- Physical Exam Details Patient Name: Date of Service: Heather Hitch [name]. 01/11/2023 11:00 [name]: [id number] Patient Account Number: [id number] Date of Birth/Sex: Treating RN: [birth year] (47 y.o. [name] Primary Care Provider: Raelene Bott Other Clinician: Massie Kluver Referring Provider: Treating Provider/Extender: Eddie North in Treatment: 37 Constitutional . Cardiovascular . Psychiatric . Notes Right foot: T the plantar heel there is an incision site with increased depth, No probing to bone. T the opening there is Nonviable tissue and granulation tissue. o o Left foot: T the medial aspect there is an open wound with granulation tissue, Callus and nonviable tissue. No overt signs of infection to any of the wound o beds. Electronic Signature(s) Signed: 01/11/2023 2:38:02 PM By: Kalman Shan DO Entered By: Kalman Shan on 01/11/2023 12:25:18 -------------------------------------------------------------------------------- Physician Orders Details Patient Name: Date of Service: Heather Hitch [name]. 01/11/2023 11:00 [name]  M Medical Record Number: [id number] Patient Account Number: [id number] Date of Birth/Sex: Treating RN:  November 02, 1975 (47 y.o. [name], [name] Primary Care Provider: Raelene Bott Other Clinician: Massie Kluver Referring Provider: Treating Provider/Extender: Eddie North in Treatment: 79 Verbal / Phone Orders: Yes Clinician: Cornell Barman Read Back and Verified: Yes Diagnosis Coding AURI, BUCKLIN [name] ([id number]) [id number] Page 10 of 22 Follow-up Appointments Return Appointment in 1 week. Nurse Visit as needed Bathing/ Shower/ Hygiene May shower with wound dressing protected with water repellent cover or cast protector. No tub bath. Anesthetic (Use 'Patient Medications' Section for Anesthetic Order Entry) Lidocaine applied to wound bed Edema Control - Lymphedema / Segmental Compressive Device / Other Elevate, Exercise Daily and [name] void Standing for Long Periods of Time. Elevate legs to the level of the heart and pump ankles as often as possible Elevate leg(s) parallel to the floor when sitting. Off-Loading Total Contact Cast to Left Lower Extremity Open toe surgical shoe - Heel off loader - Right foot Other: - keep pressure off of feet. Additional Orders / Instructions Follow Nutritious Diet and Increase Protein Intake Other: - Stay off of foot, wheelchair only Medications-Please add to medication list. Wound #11 Right,Plantar Foot Keystone Compound - use at home ntibiotic - apply Gentamycin in office only Topical [name] Wound Treatment Wound #11 - Foot Wound Laterality: Plantar, Right Cleanser: Wound Cleanser 1 x Per Day/30 Days Discharge Instructions: Wash your hands with soap and water. Remove old dressing, discard into plastic bag and place into trash. Cleanse the wound with Wound Cleanser prior to applying [name] clean dressing using gauze sponges, not tissues or cotton balls. Do not scrub or use excessive force. Pat dry using gauze  sponges, not tissue or cotton balls. Topical: Gentamicin 1 x Per Day/30 Days Discharge Instructions: Apply as directed by provider. in clinic Prim Dressing: Aquacel Extra Hydrofiber Dressing, 2x2 (in/in) ary 1 x Per Day/30 Days Secondary Dressing: ABD Pad 5x9 (in/in) (Generic) 1 x Per Day/30 Days Discharge Instructions: Cover with ABD pad Secured With: Medipore T - 4M Medipore H Soft Cloth Surgical T ape ape, 2x2 (in/yd) 1 x Per Day/30 Days Secured With: [employer] Sterile or Non-Sterile 6-ply 4.5x4 (yd/yd) 1 x Per Day/30 Days Discharge Instructions: Apply Kerlix as directed Wound #12 - Foot Wound Laterality: Plantar, Left, Medial Cleanser: Wound Cleanser 1 x Per Day/30 Days Discharge Instructions: Wash your hands with soap and water. Remove old dressing, discard into plastic bag and place into trash. Cleanse the wound with Wound Cleanser prior to applying [name] clean dressing using gauze sponges, not tissues or cotton balls. Do not scrub or use excessive force. Pat dry using gauze sponges, not tissue or cotton balls. Topical: Gentamicin 1 x Per Day/30 Days Discharge Instructions: Apply as directed by provider. in clinic Prim Dressing: Hydrofera Blue Ready Transfer Foam, 2.5x2.5 (in/in) 1 x Per Day/30 Days ary Discharge Instructions: Apply Hydrofera Blue Ready to wound bed as directed Prim Dressing: Silvercel 4 1/4x 4 1/4 (in/in) 1 x Per Day/30 Days ary Discharge Instructions: Apply to periwound Secondary Dressing: ABD Pad 5x9 (in/in) (Generic) 1 x Per Day/30 Days Discharge Instructions: Cover with ABD pad Secured With: Medipore T - 4M Medipore H Soft Cloth Surgical T ape ape, 2x2 (in/yd) 1 x Per Day/30 Days Secured With: [employer] Sterile or Non-Sterile 6-ply 4.5x4 (yd/yd) 1 x Per Day/30 Days Discharge Instructions: Apply Kerlix as directed Electronic Signature(s) Signed: 01/11/2023 2:38:02 PM By: Owens Shark, Adaiah [name] ([id number]  Page 11 of 22 Signed: 01/11/2023 2:38:02 PM By: Kalman Shan DO Entered By:  Kalman Shan on 01/11/2023 [id number] -------------------------------------------------------------------------------- Problem List Details Patient Name: Date of Service: SHAVELLE, TEP 01/11/2023 11:00 [name]: [id number] Patient Account Number: [id number] Date of Birth/Sex: Treating RN: Oct 17, 1976 (47 y.o. 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 03/16/2022 No Yes E11.621 Type 2 diabetes mellitus with foot ulcer 03/16/2022 No Yes E11.42 Type 2 diabetes mellitus with diabetic polyneuropathy 03/16/2022 No Yes L97.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 03/16/2022 No Yes of skin Inactive Problems Resolved Problems Electronic Signature(s) Signed: 01/11/2023 2:38:02 PM By: Kalman Shan DO Entered By: Kalman Shan on 01/11/2023 12:24:05 [name] ([id number]) [id number] Page 12 of 22 -------------------------------------------------------------------------------- Progress Note Details Patient Name: Date of Service: [name], FREILICH 01/11/2023 11:00 [name]: [id number] Patient Account Number: [id number] Date of Birth/Sex: Treating RN: [birth year] (47 y.o. [name] Primary Care Provider: Raelene Bott Other Clinician: Massie Kluver Referring Provider: Treating Provider/Extender: Eddie North in Treatment: 28 Subjective Chief  Complaint Information obtained from Patient 03/19/2021; patient referred by Dr. Luana Shu who has been looking after her left foot for quite [name] period of time for review of [name] nonhealing area in the left midfoot 03/12/2022; bilateral feet wounds and right lower extremity wound. History of Present Illness (HPI) 01/18/18-She is here for initial evaluation of the left great toe ulcer. She is [name] poor historian in regards to timeframe in detail. She states approximately 4 weeks ago she lacerated her toe on something in the house. She followed up with her primary care who placed her on Bactrim and ultimately [name] second dose of Bactrim prior to coming to wound clinic. She states she has been treating the toe with peroxide, Betadine and [name] Band-Aid. She did not check her blood sugar this morning but checked it yesterday morning it was 327; she is unaware of [name] recent A1c and there are no current records. She saw Dr. she would've orthopedics last week for an old injury to the left ankle, she states he did not see her toe, nor did she bring it to his attention. She smokes approximately 1 pack cigarettes [name] day. Her social situation is concerning, she arrives this morning with her mother who appears extremely intoxicated/under the influence; her mother was asked to leave the room and be monitored by the patient's grandmother. The patient's aunt then accompanied the patient and the room throughout the rest of the appointment. We had [name] lengthy discussion regarding the deleterious effects of uncontrolled hyperglycemia and smoking as it relates to wound healing and overall health. She was strongly encouraged to decrease her smoking and get her diabetes under better control. She states she is currently on [name] diet and has cut down her [facility] consumption. The left toe is erythematous, macerated and slightly edematous with malodor present. The edema in her left foot is below her baseline, there is no erythema streaking. We will  treat her with Santyl, doxycycline; we have ordered and xray, culture and provided [name] Peg assist surgical shoe and cultured the wound. 01/25/18-She is here in follow-up evaluation for [name] left great toe ulcer  and presents with an abscess to her suprapubic area. She states her blood sugars remain elevated, feeling "sick" and if levels are below 250, but she is trying. She has made no attempt to decrease her smoking stating that we "can't take away her food in her cigarettes". She has been compliant with offloading using the PEG assist you. She is using Santyl daily. the culture obtained last week grew staph aureus and Enterococcus faecalis; continues on the doxycycline and Augmentin was added on Monday. The suprapubic area has erythema, no femoral variation, purple discoloration, minimal induration, was accessed with [name] cotton tip applicator with sanguinopurulent drainage, this was cultured, I suspect the current antibiotic treatment will cover and we will not add anything to her current treatment plan. She was advised to go to urgent care or ER with any change in redness, induration or fever. 02/01/18-She is here in follow-up evaluation for left great toe ulcers and [name] new abdominal abscess from last week. She was able to use packing until earlier this week, where she "forgot it was there". She states she was feeling ill with GI symptoms last week and was not taking her antibiotic. She states her glucose levels have been predominantly less than 200, with occasional levels between 200-250. She thinks this was contributing to her GI symptoms as they have resolved without intervention. There continues to be significant laceration to left toe, otherwise it clinically looks stable/improved. There is now less superficial opening to the lateral aspect of the great toe that was residual blister. We will transition to [facility] to all wounds, she will continue her Augmentin. If there is no change or deterioration  next week for reculture. 02/08/18-She is here in follow-up evaluation for left great toe ulcer and abdominal ulcer. There is an improvement in both wounds. She has been wrapping her left toe with coban, not by our direction, which has created an area of discoloration to the medial aspect; she has been advised to NOT use coban secondary to her neuropathy. She states her glucose levels have been high over this last week ranging from 200-350, she continues to smoke. She admits to being less compliant with her offloading shoe. We will continue with same treatment plan and she will follow-up next week. 02/15/18-She is here in follow-up evaluation for left great toe ulcer and abdominal ulcer. The abdominal ulcer is epithelialized. The left great toe ulcer is improved and all injury from last week using the Coban wrap is resolved, the lateral ulcer is healed. She admits to noncompliance with wearing offloading shoe and admits to glucose levels being greater than 300 most of the week. She continues to smoke and expresses no desire to quit. There is one area medially that probes deeper than it has historically, erythema to the toe and dorsal foot has consistently waxed and waned. There is no overt signs of cellulitis or infection but we will culture the wound for any occult infection given the new area of depth and erythema. We will hold off on sensitivities for initiation of antibiotic therapy. 02/22/18-She is here in follow up evaluation for left great toe ulcer. There is overall significant improvement in both wound appearance, erythema and edema with changes made last week. She was not initiated on antibiotic therapy. Culture obtained last week showed oxacillin sensitive staph aureus, sensitive to clindamycin. Clindamycin has been called into the pharmacy but she has been instructed to hold off on initiation secondary to overall clinical improvement and her history of antibiotic intolerance. She  has been  instructed to contact the clinic with any noted changes/deterioration and the wound, erythema, edema and/or pain. She will follow-up next week. She continues to smoke and her glucose levels remain elevated >250; she admits to compliance with offloading shoe 03/01/18 on evaluation today patient appears to be doing fairly well in regard to her left first toe ulcer. She has been tolerating the dressing changes with the [facility] Dressing without complication and overall this has definitely showed signs of improvement according to records as well is what the patient tells me today. I'm very pleased in that regard. She is having no pain today 03/08/18 She is here for follow up evaluation of [name] left great toe ulcer. She remains non-compliant with glucose control and smoking cessation; glucose levels consistently >200. She states that she got new shoe inserts/peg assist. She admits to compliance with offloading. Since my last evaluation there is significant improvement. We will switch to prisma at this time and she will follow up next week. She is noted to be tachycardic at this appointment, heart rate 120s; she has [name] history of heart rate 70-130 according to our records. She admits to extreme agitation r/t personal issues; she was advised to monitor her heartrate and contact her physician if it does not return to [name] more normal range (<100). She takes cardizem twice daily. 03/15/18-She is here in follow-up evaluation for left great toe ulcer. She remains noncompliant with glucose control and smoking cessation. She admits to compliance with wearing offloading shoe. The ulcer is improved/stable and we will continue with the same treatment plan and she will follow-up next week 03/22/18-She is here for evaluation for left great toe ulcer. There continues to be significant improvement despite recurrent hyperglycemia (over 500 yesterday) and she continues to smoke. She has been compliant with offloading and we will  continue with same treatment plan and she will follow-up next week. 03/29/18-She is here for evaluation for left great toe ulcer. Despite continuing to smoke and uncontrolled diabetes she continues to improve. She is compliant with offloading shoe. We will continue with the same treatment plan and she will follow-up next week 04/05/18- She is here in follow up evaluation for [name] left great toe ulcer; she presents with small pustule to left fifth toe (resembles ant bite). She admits to compliance with wearing offloading shoe; continues to smoke or have uncontrolled blood glucose control. There is more callus than usual with evidence of bleeding; she denies known trauma. 04/12/18-She is here for evaluation of left great toe ulcer. Despite noncompliance with glycemic control and smoking she continues to make improvement. She continues to wear offloading shoe. The pustule, that was identified last week, to the left fifth toe is resolved. She will follow-up in 2 weeks 05/03/18-she is seen in follow-up evaluation for [name] left great toe ulcer. She is compliant with offloading, otherwise noncompliant with glycemic control and smoking. She has plateaued and there is minimal improvement noted. We will transition to [facility], replaced the insert to her surgical shoe and she will follow-up in one week 05/10/18- She is here in follow up evaluation for [name] left great toe ulcer. It appears stable despite measurement change. We will continue with same treatment plan and follow up next week. 05/24/18-She is seen in follow-up evaluation for [name] left great toe ulcer. She remains compliant with offloading, has made significant improvement in her diet, decreasing the amount of sugar/soda. She said her recent A1c was 10.9 which is lower than. She did  see [name] diabetic nutritionist/educator yesterday. She continues to smoke. We will continue with the same treatment plan and she'll follow-up next week. 05/31/18- She is seen in follow-up  evaluation for left great toe ulcer. She continues to remain compliant with offloading, continues to make improvement in her diet, increasing her water and decreasing the amount of sugar/soda. She does continue to smoke with no desire to quit. We will apply Prisma to the depth and Hydrofera Blue over. We have not received insurance authorization for oasis. She will follow up next week. [name], [name] ([id number]) [id number] Page 13 of 22 06/07/18-She is seen in follow-up evaluation for left great toe ulcer. It has stalled according to today's measurements although base appears stable. She says she saw [name] diabetic educator yesterday; her average blood sugars are less than 300 which is an improvement for her. She continues to smoke and states "that's my next step" She continues with water over soda. We will order for xray, culture and reinstate ace wrap compression prior to placing apligraf for next week. She is voicing no complaints or concerns. Her dressing will change to iodoflex over the next week in preparation for apligraf. 06/14/18-She is seen in follow-up evaluation for left great toe ulcer. Plain film x-ray performed last week was negative for osteomyelitis. Wound culture obtained last week grew strep B and OSSA; she is initiated on keflex and cefdinir today; there is erythema to the toe which could be from ace wrap compression, she has [name] history of wrapping too tight and has has been encouraged to maintain ace wraps that we place today. We will hold off on application of apligraf today, will apply next week after antibiotic therapy has been initiated. She admits today that she has resumed taking [name] shower with her foot/toe submerged in water, she has been reminded to keep foot/toe out of the bath water. She will be seen in follow up next week 06/21/18-she is seen in follow-up evaluation for left great toe ulcer. She is tolerating antibiotic therapy with no GI  disturbance. The wound is stable. Apligraf was applied today. She has been decreasing her smoking, only had 4 cigarettes yesterday and 1 today. She continues being more compliant in diabetic diet. She will follow-up next week for evaluation of site, if stable will remove at 2 weeks. 06/28/18- She is here in follow up evalution. Apligraf was placed last week, she states the dressing fell off on Tuesday and she was dressing with hydrofera blue. She is healed and will be discharged from the clinic today. She has been instructed to continue with smoking cessation, continue monitoring glucose levels, offloading for an additional 4 weeks and continue with hydrofera blue for additional two weeks for any possible microscopic opening. Readmission: 08/07/18 on evaluation today patient presents for reevaluation concerning the ulcer of her right great toe. She was previously discharged on 06/28/18 healed. Nonetheless she states that this began to show signs of drainage she subsequently went to her primary care provider. Subsequently an x-ray was performed on 08/01/18 which was negative. The patient was also placed on antibiotics at that time. Fortunately they should have been effective for the infection. Nonetheless she's been experiencing some improvement but still has [name] lot of drainage coming from the wound itself. 08/14/18 on evaluation today patient's wound actually does show signs of improvement in regard to the erythema at this point. She has completed the antibiotics. With that being said we did discuss the possibility of placing her in [name] total contact  cast as of today although I think that I may want to give this just [name] little bit more time to ensure nothing recurrence as far as her infection is concerned. I do not want to put in the cast and risk infection at that time if things are not completely resolved. With that being said she is gonna require some debridement today. 08/21/18 on evaluation today patient  actually appears to be doing okay in regard to her toe ulcer. She's been tolerating the dressing changes without complication. With that being said it does appear that she is ready and in fact I think it's appropriate for [place] to go ahead and initiate the total contact cast today. Nonetheless she will require some sharp debridement to prepare the wound for application. Overall I feel like things have been progressing well but we do need to do something to get this to close more readily. 08/24/18 patient seen today for reevaluation after having had the total contact cast applied on Tuesday. She seems to have done very well the wound appears to be doing great and overall I'm pleased with the progress that she's made. There were no abnormal areas of rubbing from the cast on her lower extremity. 08/30/18 on evaluation today patient actually appears to be completely healed in regard to her plantar toe ulcer. She tells me at this point she's been having [name] lot of issues with the cast. She almost fell [name] couple of times the state shall the step of her dog [name] couple times as well. This is been [name] very frustrating process for her other nonetheless she has completely healed the wound which is excellent news. Overall there does not appear to be the evidence of infection at this time which is great news. 09/11/18 evaluation today patient presents for follow-up concerning her great toe ulcer on the left which has unfortunately reopened since I last saw her which was only [name] couple of weeks ago. Unfortunately she was not able to get in to get the shoe and potentially the AFO that's gonna be necessary due to her left foot drop. She continues with offloading shoe but this is not enough to prevent her from reopening it appears. When we last had her in the total contact cast she did well from [name] healing standpoint but unfortunately the wound reopened as soon as she came out of the cast within just [name] couple of weeks. 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 This is also part of the reason why she falls on [name] regular basis. I just do not believe that is gonna be safe for [place] to reinitiate the total contact cast as last time we had this on she fell 3 times one week which is definitely not normal for her. 09/18/18 upon evaluation today the patient actually appears to be doing about the same in regard to her toe ulcer. She did not contact [employer] as I asked her to even though I had given her the prescription. In fact she actually states that she has no idea where the prescription is. She did apparently call [employer] and they told her that all she needed to do was bring the prescription in order to be able to be seen and work on getting the AFO for her  left foot. With all that being said she still does not have an appointment and I'm not sure were things stand that regard. I will give her [name] new prescription today in order to contact them to get this set up. 09/25/18 on evaluation today patient actually appears to be doing about the same in regard to her toes ulcer. She does have [name] small areas which seems to have [name] lot of callous buildup around the edge of the wound which is going to need sharp debridement today. She still is waiting to be scheduled for evaluation with [employer] for possibility of an AFO. She states there supposed to call her tomorrow to get this set up. Unfortunately it does appear that her foot specifically the toe area is showing signs of erythema. There does not appear to be any systemic infection which is in these good news. 10/02/18 on evaluation today patient actually appears to be doing about the same in regard to her toe ulcer. This really has not done too well although it's not significantly larger it's also not  significantly smaller. She has been tolerating the dressing changes without complication. She actually has her appointment with [employer] and [place] tomorrow to hopefully be measured for obtaining and AFO splint. I think this would be helpful preventing this from reoccurring. We had contemplated starting the cast this week although to be honest I am reluctant to do that as she's been having nausea, vomiting, and seizure activity over the past three days. She has [name] history of seizures and have been told is nothing that can be done for these. With that being said I do believe that along with the seizures have the nausea vomiting which upon further questioning doesn't seem to be the normal for her and makes me concerned for the possibility of infection or something else going on. I discussed this with the patient and her mother during the office visit today. I do not feel the wound is effective but maybe something else. The responses this was "this just happens to her at times and we don't know why". They did not seem to be interested in going to the hospital to have this checked out further. 10/09/18 on evaluation today patient presents for follow-up concerning her ongoing toe ulcer. She has been tolerating the dressing changes without complication. Fortunately there does not appear to be any evidence of infection which is great news however I do think that the patient would benefit from going ahead for with the total contact cast. She's actually in [name] wheelchair today she tells me that she will use her walker if we initiate the cast. I was very specific about the fact that if we were gonna do the cast I wanted to make sure that she was using the walker in order to prevent any falls. She tells me she does not have stairs that she has to traverse on [name] regular basis at her home. She has not had any seizures since last week again that something that happens to her often she tells me she did talk to [employer]  and they said that it may take up to three weeks to get the brace approved for her. Hopefully that will not take that long but nonetheless in the meantime I do think the cast could be of benefit. 10/12/18 on evaluation today patient appears to be doing rather well in regard to her toe ulcer. It's just been [name] few days and already this is significantly improved both as far  as overall appearance and size. Fortunately there's no sign of infection. She is here for her first obligatory cast change. 10/19/18 Seen today for follow up and management of left great toe ulcer. Wound continues to show improvement. Noted small open area with seroussang drainage with palpation. Denies any increased pain or recent fevers during visit. She will continue calcium alginate with offloading shoe. Denies any questions or concerns during visit. 10/26/18 on evaluation today patient appears to be doing about the same as when I last saw her in regard to her wound bed. Fortunately there does not appear to be any signs of infection. Unfortunately she continues to have [name] breakdown in regard to the toe region any time that she is not in the cast. It takes almost no time at all for this to happen. Nonetheless she still has not heard anything from the brace being made by [employer] as to when exactly this will be available to her. Fortunately there is no signs of infection at this time. 10/30/18 on evaluation today patient presents for application of the total contact cast as we just received him this morning. Fortunately we are gonna be able to DELOIS, GRAVATT [name] ([id number]) [id number] Page 14 of 22 apply this to her today which is great news. She continues to have no significant pain which is good news. Overall I do feel like things have been improving while she was the cast is when she doesn't have [name] cast that things get worse. She still has not really heard anything from [place] regarding her brace. 11/02/18  upon evaluation today patient's wound already appears to be doing significantly better which is good news. Fortunately there does not appear to be any signs of infection also good news. Overall I do think the total contact cast as before is helping to heal this area unfortunately it's just not gonna likely keep the area closed and healed without her getting her brace at least. Again the foot drop is [name] significant issue for her. 11/09/18 on evaluation today patient appears to be doing excellent in regard to her toe ulcer which in fact is completely healed. Fortunately we finally got the situation squared away with the paperwork which was needed to proceed with getting her brace approved by Medicaid. I have filled that out unfortunately that information has been sent to the orthopedic office that I worked at 2 1/2 years ago and not tired Current wound care measures. Fortunately she seems to be doing very well at this time. 11/23/18 on evaluation today patient appears to be doing More Poorly Compared to Last Time I Saw Her. At [facility] She Had Completely Healed. Currently she is continuing to have issues with reopening. She states that she just found out that the brace was approved through Medicaid now she just has to go get measured in order to have this fitted for her and then made. Subsequently she does not have an appointment for this yet that is going to complicate things we obviously cannot put her back in the cast if we do not have everything measured because they're not gonna be able to measure her foot while she is in the cast. 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 Obviously this is unfortunate and does have me somewhat worried as well. 11/30/18 on evaluation today patient's toe ulcer actually appears to be doing fairly well. The good news  is she will be getting her brace in the shoes next week  on Wednesday. Hopefully we will be able to get this to heal without having to go back in the cast however she may need the cast in order to get the wound completely heal and then go from there. Fortunately there's no signs of infection at this time. 12/07/18 on evaluation today patient fortunately did receive her brace and she states she could tell this definitely makes her walk better. With that being said she's been having issues with her toe where she noticed yesterday there was [name] lot of tissue that was loosing off this appears to be much larger than what it was previous. She also states that her leg has been read putting much across the top of her foot just about the ankle although this seems to be receiving somewhat. The total area is still red and appears to be someone infected as best I can tell. She is previously taken Bactrim and that may be [name] good option for her today as well. We are gonna see what I wound culture shows as well and I think that this is definitely appropriate. With that being said outside of the culture I still need to initiate something in the interim and that's what I'm gonna go ahead and select Bactrim is [name] good option for her. 12/14/18 on evaluation today patient appears to be doing better in regard to her left great toe ulcer as compared to last week's evaluation. There's still some erythema although this is significantly improved which is excellent news. Overall I do believe that she is making good progress is still gonna take some time before she is where I would like her to be from the standpoint of being able to place her back into the total contact cast. Hopefully we will be where we need to be by next week. 12/21/18 on evaluation today patient actually appears to be doing poorly in regard to her toe ulcer. She's been tolerating the dressing changes without complication. Fortunately there's no signs of systemic infection although she does have [name] lot of drainage from the  toe ulcer and this does seem to be causing some issues at this point. She does have erythema on the distal portion of her toe that appears to be likely cellulitis. 12/28/18 on evaluation today patient actually appears to be doing [name] little better in my pinion in regard to her toe ulcer. With that being said she still does have some evidence of infection at this time and for her culture she had both E. coli as well as enterococcus as organisms noted on evaluation. For that reason I think that though the Keflex likely has treated the E. coli rather well this has really done nothing for the enterococcus. We are going to have to initiate treatment for this specifically. 01/04/19 on evaluation today patient's toe actually appears to be doing better from the standpoint of infection. She currently would like to see about putting the cash back on I think that this is appropriate as long as she takes care of it and keeps it from getting wet. She is gonna have some drainage we can definitely pass this up with Drawtex and alginate to try to prevent as much drainage as possible from causing the problems. With that being said I do want to at least try her with the cast between now and Tuesday. If there any issues we can't continue to use it then I will discontinue the  use of the cast at that point. 01/08/19 on evaluation today patient actually appears to be doing very well as far as her foot ulcer specifically the great toe on the left is concerned. She did have an area of rubbing on the medial aspect of her left ankle which again is from the cast. Fortunately there's no signs of infection at this point in this appears to be [name] very slight skin breakdown. The patient tells me she felt it rubbing but didn't think it was that bad. Fortunately there is no signs of active infection at this time which is good news. No fevers, chills, nausea, or vomiting noted at this time. 01/15/19 on evaluation today patient actually appears  to be doing well in regard to her toe ulcer. Again as previous she seems to do well and she has the cast on which indicates to me that during the time she doesn't have [name] cast on she's putting way too much pressure on this region. Obviously I think that's gonna be an issue as with the current national emergency concerning the Covid-19 Virus it has been recommended that we discontinue the use of total contact casting by the chief medical officer of our company, Dr. Simona Huh. The reasoning is that if [name] patient becomes sick and cannot come into have the cast removed they could not just leave this on for an additional two weeks. Obviously the hospitals also do not want to receive patient's who are sick into the emergency department to potentially contaminate the region and spread the Covid-19 Virus among other sick individuals within the hospital system. Therefore at this point we are suspending the use of total contact cast until the current emergency subsides. This was all discussed with the patient today as well. 01/22/19 on evaluation today patient's wound on her left great toe appears to be doing slightly worse than previously noted last week. She tells me that she has been on this quite [name] bit in fact she tells me she's been awake for 38 straight hours. This is due to the fact that she's having to care for grandparents because nobody else will. She has been taking care of them for five the last seven days since I've seen her they both have dementia his is from [name] stroke and her grandmother's was progressive. Nonetheless she states even her mom who knows her condition and situation has only help two of those days to take care of them she's been taking care of the rest. Fortunately there does not appear to be any signs of active infection in regard to her toe at this point although obviously it doesn't look as good as it did previous. I think this is directly related to her not taking off the pressure and  friction by way of taking things easy. Though I completely understand what's going on. 01/29/19 on evaluation today patient's tools are actually appears to be showing some signs of improvement today compared to last week's evaluation as far as not necessarily the overall size of the wound but the fact that she has some new skin growth in between the two ends of the wound opening. Overall I feel like she has done well she states that she had [name] family member give her what sounds to be [name] CAM walker boot which has been helpful as well. 02/05/19 on evaluation today patient's wound bed actually appears to be doing significantly better in regard to her overall appearance of the size of the wound. With that being  said she is still having an issue with offloading efficiently enough to get this to close. Apparently there is some signs of infection at this point as well unfortunately. Previously she's done well of Augmentin I really do not see anything that needs to be culture currently but there theme and cellulitis of the foot that I'm seeing I'm gonna go ahead and place her on an antibiotic today to try to help clear this up. 02/12/2019 on evaluation today patient actually appears to be doing poorly in regard to her overall wound status. She tells me she has been using her offloading shoe but actually comes in today wearing her tennis shoe with the AFO brace. Again as I previously discussed with her this is really not sufficient to allow the area to heal appropriately. Nonetheless she continues to be somewhat noncompliant and I do wonder based on what she has told my nurse in the past as to whether or not [name] good portion of this noncompliance may be recreational drug and alcohol related. She has had [name] history of heroin overdose and this was fairly recently in the past couple of months that have been seeing her. Nonetheless overall I feel like her wound looks significantly worse today compared to what it was  previous. She still has significant erythema despite the Augmentin I am not sure that this is an appropriate medication for her infection I am also concerned that the infection may have gone down into her bone. 02/19/19 on evaluation today patient actually appears to be doing about the same in regard to her toe ulcer. Unfortunately she continues to show signs of bone exposure and infection at this point. There does not appear to be any evidence of worsening of the infection but I'm also not really sure that it's getting significantly better. She is on the Augmentin which should be sufficient for the Staphylococcus aureus infection that she has at this point. With that being said she may need IV antibiotics to more appropriately treat this. We did have [name] discussion today about hyperbaric option therapy. [name], [name] ([id number]) [id number] Page 15 of 22 02/28/19 on evaluation today patient actually appears to be doing much worse in regard to the wound on her left great toe as compared to even my previous evaluation last week. Unfortunately this seems to be training in [name] pretty poor direction. Her toe was actually now starting to angle laterally and I can actually see the entire joint area of the proximal portion of the digit where is the distal portion of the digit again is no longer even in contact with the joint line. Unfortunately there's [name] lot more necrotic tissue around the edge and the toe appears to be showing signs of becoming gangrenous in my pinion. I'm very concerned about were things stand at this point. She did see infectious disease and they are planning to send in [name] prescription for Sivextro for her and apparently this has been approved. With that being said I don't think she should avoid taking this but at the same time I'm not sure that it's gonna be sufficient to save her toe at this point. She tells me that she still having to care for grandparents which I  think is putting quite [name] bit of strain on her foot and specifically the total area and has caused this to break down even to [name] greater degree than would've otherwise been expected. 03/05/19 on evaluation today patient actually appears to be doing quite well in regard  to her toe all things considering. She still has bone exposed but there appears to be much less your thing on overall the appearance of the wound and the toe itself is dramatically improved. She still does have some issues currently obviously with infection she did see vascular as well and there concerned that her blood flow to the toad. For that reason they are setting up for an angiogram next week. 03/14/19 on evaluation today patient appears to be doing very poor in regard to her toe and specifically in regard to the ulceration and the fact that she's starting to notice the toe was leaning even more towards the lateral aspect and the complete joint is visible on the proximal aspect of the joint. Nonetheless she's also noted [name] significant odor and the tip of the toe is turning more dark and necrotic appearing. Overall I think she is getting worse not better as far as this is concerned. For that reason I am recommending at this point that she likely needs to be seen for likely amputation. READMISSION 03/19/2021 This is [name] patient that we cared for in this clinic for [name] prolonged period of time in 2019 and 2020 with [name] left foot and left first toe wound. 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 Excell SeltzerBaker. In December 2021 she had an ulcer on her right great toe as well as the fourth and fifth toes. 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 She also had an angiogram at that time and underwent angioplasty of the right anterior tibial artery. In any case she claims that the wound on the  right foot is closed I did not look at this today which was probably an oversight although I think that should be done next week. 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 According to Dr. Bernette RedbirdBaker's last review that she was out of the area being cared for by another physician but recently came back to his attention. 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 [name] culture of this area showed E. coli apparently before she came back to see Dr. Excell SeltzerBaker she was supposed to be receiving antibiotics but she did not really take them. Nor is she offloading this area at all. Finally her last hemoglobin A1c listed in epic was in March 2022 at 14.1 she says things are [name] lot better since then although I am not sure. 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 She had this wound at the time. During this admission she had E. coli sepsis [name] TEE was negative. 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 She is still [name] heavy cigarette smoker. She has [name] history of bipolar disorder seizure disorder ABI in our clinic was 1.05 6/1; left midfoot in the setting of [name] TMA done previously. Round circular wound with [name] "knuckle" of protruding tissue. The problem is that the knuckle was not attached to any of the surrounding granulation and this probed proximally widely I removed [name] large portion of this tissue. This wound goes with  considerable undermining laterally. I do not feel any bone there was no purulence but this is [name] deep wound. 6/8; in spite of the debridement I did last week. She arrives with [name] wound looking exactly the same. [name]  protruding "knuckle" of tissue nonadherent to most of the surrounding tissue. There is considerable depth around this from 6-12 o'clock at 2.7 cm and undermining of 1 cm. This does not look overtly infected and the x- ray I did last week was negative for any osseous abnormalities. We have been using silver collagen 6/15; deep tissue culture I did last week showed moderate staph aureus and moderate Pseudomonas. This will definitely require prolonged antibiotic therapy. The pathology on the protuberant area was negative for malignancy fungus etc. 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 We have been using silver collagen. I am going to be prescribing Levaquin for 2 weeks[address] Her CT scan of the foot is down for 7/5 6/22; CT scan of the foot on 7 5. She says she has hardware in the left leg from her previous fracture. She is on the Levaquin for the deep tissue culture I did that showed methicillin sensitive staph aureus and Pseudomonas. I gave her [name] 2-week supply and she will have another week. She arrives in clinic today with the same protuberant tissue however this is nonadherent to the tissue surrounding it. I am really at [name] loss to explain this unless there is underlying deep tissue infection 6/29; patient presents for 1 week follow-up. She has been using collagen to the wound bed. She reports taking her antibiotics as prescribed.She has no complaints or issues today. She denies signs of infection. 7/6; patient presents for one week followup. She has been using collagen to the wound bed. She states she is taking Levaquin however at times she is not able to keep it down. She denies signs of infection. 7/13; patient presents for 1 week follow-up. She has been using silver alginate to the wound bed. She still has nausea when taking her antibiotics. She denies signs of infection. 7/20; patient presents for 1 week follow-up. She has been using  silver alginate with gentamicin cream to the wound bed. She denies any issues and has no complaints today. She denies signs of infection. 7/27; patient presents for 1 week follow-up. She continues to use silver alginate with gentamicin cream to the wound bed. She reports starting her antibiotics. She has no issues or complaints. Overall she reports stability to the wound. 8/3; patient presents for 1 week follow-up. She has been using silver alginate with gentamicin cream to the wound bed. She reports completing all antibiotics. She has no issues or complaints today. She denies signs of infection. 8/17; patient presents for 2-week follow-up. He is to use silver alginate to the wound bed. She has no issues or complaints today. She denies signs of infection. She reports her pain has improved in her foot since last clinic visit 8/24; patient presents for 1 week follow-up. She continues to use silver alginate to the wound bed. She has no issues or complaints. She denies signs of infection. Pain is stable. 9/7; patient presents for follow-up. She missed her last week appointment due to feeling ill. She continues to use silver alginate. She has [name] new wound to the right lower extremity that is covered in eschar. She states It occurred over the past week and has no idea how it started. She currently denies signs of infection. 9/14; patient  presents for follow-up. T the left foot wound she has been using gentamicin cream and silver alginate. T the right lower extremity wound she has o o been keeping this covered and has not obtain Santyl. 9/21; patient presents for follow-up. She reports using gentamicin cream and silver alginate to the left foot and Santyl to the right lower extremity wound. She has no issues or complaints today. She denies signs of infection. 9/28; patient presents for follow-up. She reports [name] new wound to her right heel. She states this occurred [name] few days ago and is progressively gotten  worse. She has been trying to clean the area with [name] Q-tip and Santyl. She reports stability in the other 2 wounds. She has been using gentamicin cream and silver alginate to the left foot and Santyl to the right lower extremity wound. 10/12; patient presents for follow-up. She reports improvement to the wound beds. She is seeing vein and vascular to discuss the potential of [name] left BKA. She states they are going to do an arteriogram. She continues to use silver alginate with dressing changes to her wounds. 11/2; patient presents for follow-up. She states she has not been doing dressing changes to the wound beds. She states she is not able to offload the areas. She reports chronic pain to her left foot wound. 11/9; patient presents for follow-up. She came in with only socks on. She states she forgot to put on shoes. It is unclear if she is doing any dressing changes. She currently denies systemic signs of infection. 11/16; patient presents for follow-up. She came again only with socks on. She states she does not wear shoes ever. It is unclear if she does dressing changes. AZHARIA, MIKOLAJCZAK [name] ([id number]) [id number] Page 16 of 22 She currently denies systemic signs of infection. 11/23; patient presents for follow-up. She wore her shoes today. It still unclear exactly what dressing she is using for each wound but she did states she obtained Dakin's solution and has been using this to the left foot wound. She currently denies signs of infection. 11/30; patient presents for follow-up. She has no issues or complaints today. She currently denies signs of infection. 12/7; patient presents for follow-up. She has no issues or complaints today. She has been using Hydrofera Blue to the right heel wound and Dakin solution to the left foot wound. Her right anterior leg wound is healed. She currently denies signs of infection. 12/14; patient presents for follow-up. She has been using  Hydrofera Blue to the right heel and Dakin's to the left foot wounds. She has no issues or complaints today. She denies signs of infection. 12/21; patient presents for follow-up. She reports using Hydrofera Blue to the right heel and Dakin's to the left foot wound. She denies signs of infection. 12/28; patient presents for follow-up. She continues to use Dakin's to the left foot wound and Hydrofera Blue to the right heel wound. She denies signs of infection. 1/4; patient presents for follow-up. She has no issues or complaints today. She denies signs of infection. 1/11; patient presents for follow-up. It is unclear if she has been dressing these wounds over the past week. She currently denies signs of infection. 1/18; patient presents for follow-up. She states she has been using Dakin's wet-to-dry dressings to the left foot. She has been using Hydrofera Blue to the right foot foot wound. She states that the anterior right leg wound has reopened and draining serous fluid. She denies signs of infection. 1/25; patient presents for  follow-up. She has no issues or complaints today. 2/1; patient presents for follow-up. She has no issues or complaints today. She denies signs of infection. 2/8; patient presents for follow-up. She has lost her surgical shoes. She did not have [name] dressing to the right heel wound. She currently denies signs of infection. 2/15; patient presents for follow-up. She reports more pain to the right heel today. She denies purulent drainage Or fever/chills 2/22; patient presents for follow-up. She reports taking clindamycin over the past week. She states that she continues to have pain to her right heel. She reports purulent drainage. Readmission 03/16/2022 Ms. Takecia Degraw is [name] 47 year old female with [name] past medical history of type 2 diabetes, osteomyelitis to her feet, chronic systolic heart failure and bipolar disorder that presents to the clinic for bilateral feet wounds and right  lower extremity wound. She was last seen in our clinic on 12/15/2021. At that time she had purulent drainage coming out of her right plantar foot and I recommended she go to the ED. She states she went to [facility] and has been there for the past 3 months. I cannot see the records. She states she had OR debridement and was on several weeks of IV antibiotics while inpatient. Since discharge she has not been taking care of the wound beds. She had nothing on her feet other than socks today. She currently denies signs of infection. 5/31; patient presents for follow-up. She has been using Dakin's wet-to-dry dressings to the wound beds on her feet bilaterally and antibiotic ointment to the right anterior leg wound. She had [name] wound culture done at last clinic visit that showed moderate Pseudomonas aeruginosa sensitive to ciprofloxacin. She currently denies systemic signs of infection. 6/14; patient presents for follow-up. She received Keystone 5 days ago and has been using this on the wound beds. She states that last week she had to go to the hospital because she had increased warmth and erythema to the right foot. She was started on 2 oral antibiotics. She states she has been taking these. She currently denies systemic signs of infection. She has no issues or complaints today. 6/21; patient presents for follow-up. She states she has been using Keystone antibiotics to the wound beds. She has no issues or complaints today. She denies signs of infection. 6/28; patient presents for follow-up. She has been using Keystone antibiotics to the wound beds. She has no issues or complaints today. 7/12; patient presents for follow-up. Has been using Keystone antibiotics to the wound beds with calcium alginate. She has no issues or complaints today. She never followed up with her orthopedic surgeon who did the OR debridement to the right foot. We discussed the total contact cast for the left foot and  patient would like to do this next week. 7/19; patient presents for follow-up. She has been using Keystone antibiotics with calcium alginate to the wound beds. She has no issues or complaints today. Patient is in agreement to do the total contact cast of the left foot today. She knows to return later this week for the obligatory cast change. 05-13-2022 upon evaluation today patient's wound which she has the cast of the left leg actually appears to be doing significantly better. Fortunately I do not see any signs of active infection locally or systemically which is great news and overall I am extremely pleased with where we stand currently. 7/26; patient presents for follow-up. She has [name] cast in place for the past week. She states it  irritated her shin. Other than that she tolerated the cast well. She states she would like [name] break for 1 week from the cast. We have been using Keystone antibiotic and Aquacel to both wound beds. She denies signs of infection. 8/2; patient presents for follow-up. She has been using Keystone and Aquacel to the wound beds. She denies any issues and has no complaints. She is agreeable to have the cast placed today for the left leg. 06-03-2022 upon evaluation today patient appears to be doing well with regard to her wound she saw some signs of improvement which is great news. Fortunately I do not see any evidence of active infection locally or systemically at this time which is great news. No fevers, chills, nausea, vomiting, or diarrhea. 8/16; patient presents for follow-up. She has no issues or complaints today. We have been using Keystone and Aquacel to the wound beds. The left lower extremity is in [name] total contact cast. She is tolerated this well. 8/23; patient presents for follow-up. She has had the total contact cast on the left leg for the past week. Unfortunately this has rubbed and broken down the skin to the medial foot. She currently denies signs of infection. She  has been using Keystone antibiotic to the right plantar foot wound. 8/30; patient presents for follow-up. We have held off on the total contact cast for the left leg for the past week. Her wound on the left foot has improved and the previous surrounding breakdown of skin has epithelialized. She has been using Keystone antibiotic to both wound beds. She has no issues or complaints today. She denies signs of infection. 9/6; patient presents for follow-up. She has ordered her's Keystone antibiotic and this is arriving this week. She has been doing Dakin's wet-to-dry dressings to the wound beds. She denies signs of infection. She is agreeable to the total contact cast today. 9/13; patient presents for follow-up. She states that the cast caused her left leg shin to be sore. She would like to take [name] break from the cast this week. She [name], [name] ([id number]) [id number] Page 17 of 22 has been using Keystone antibiotic to the right plantar foot wound. She denies signs of infection. 9/20; patient presents for follow-up. She has been using Keystone antibiotics to the wound beds with calcium alginate to the right foot wound and Hydrofera Blue to the left foot wound. She is agreeable to having the cast placed today. She has been approved for Apligraf and we will order this for next clinic visit. 9/27; patient presents for follow-up. We have been using Keystone antibiotic with Hydrofera Blue to the left foot wound under [name] total contact cast. T the right o foot wound she has been using Keystone antibiotic and calcium alginate. She declines [name] total contact cast today. Apligraf is available for placement and she would like to proceed with this. 07-28-2022 upon evaluation today patient appears to be doing well currently in regard to her wound. She is actually showing signs of significant improvement which is great news. Fortunately I do not see any evidence of active infection locally  nor systemically at this time. She has been seeing Dr. Heber  and to be honest has been doing very well with the cast. Subsequently she comes in today with [name] cast on and we did reapply that today as well. She did not really want to she try to talk me out of that but I explained that if she wanted to heal this is really the  right way to go. Patient voiced understanding. In regard to her right foot this is actually [name] lot better compared to the last time I saw her which is also great news. 10/11; patient presents for follow-up. Apligraf and the total contact cast was placed to the left leg at last clinic visit. She states that her right foot wound had burning pain to it with the placement of Apligraf to this area. She has been doing Leroy over this area. She denies signs of infection including increased warmth, erythema or purulent drainage. 11/1; 3-week follow-up. The patient fortunately did not have [name] total contact cast or an Apligraf and on the left foot. She has been using Keystone ABD pads and kerlix and her own running shoes She arrives in clinic today with thick callus and [name] very poor surface on the left foot on the right nonviable skin subcutaneous tissue and [name] deep probing hole. 11/15; patient missed her last clinic appointment. She states she has not been dressing the wound beds for the past 2 weeks. She states that at she had [name] new roommate but is now going back to live with her mother. Apparently its been [name] distracting 2 weeks. Patient currently denies signs of infection. 11/22; patient presents for follow-up. She states she has been using Keystone antibiotic and Dakin's wet-to-dry dressings to the wound beds. She is agreeable for cast placement today. We had ordered Apligraf however this has not been received by our facility. 11/29; Patient had [name] total contact cast placed at last clinic visit and she tolerated this well. We were using silver alginate under the cast. Patient's been  using Keystone antibiotic with Aquacel to the right plantar foot wound. She has no issues or complaints today. Apligraf is available for placement today. Patient would like to proceed with this. 12/6; patient presents for follow-up. She had Apligraf placed in standard fashion last clinic visit under the total contact cast to the left lower extremity. She has been using Keystone antibiotic and Aquacel to the right plantar foot wound. She has no issues or complaints today. 12/13; patient presents for follow-up. She has finished 5 Apligraf placements. Was told she would not qualify for more. We have been doing [name] total contact cast to the left lower extremity. She has been using Keystone antibiotic and Aquacel to the right plantar foot wound. She has no issues or complaints today. 12/20; patient presents for follow-up. We have been using Hydrofera Blue with Keystone antibiotic under [name] total contact cast of the left lower extremity. She reports using Keystone antibiotic and silver alginate to the right heel wound. She has no issues or complaints today. 12/27; patient presents with [name] healthy wound on the left midfoot. We have Apligraf to apply that to that more also using [name] total contact cast. On the right we are using Keystone and silver alginate. She is offloading the right heel with [name] surgical shoealthough by her admission she is on her feet quite [name] bit 1/3; patient presents for follow-up. Apligraf was placed to the wound beds last clinic visit. She was placed in [name] total contact cast to the left lower extremity. She declines [name] total contact cast today. She states that her mother is in the hospital and she cannot adequately get around with the cast on. 1/10; patient presents for follow-up. She declined the total contact cast at last clinic visit. Both wounds have declined in appearance. She states that she has been on her feet and not offloading the wound  beds. She currently denies signs of  infection. 1/17; patient presents for follow-up. She had the total contact cast along with Apligraf placed last week to the left lower extremity. She tolerated this well. She has been using Aquacel Ag and Keystone antibiotic to the right heel wound. She currently denies signs of infection. She has no issues or complaints today. 1/24; patient presents for follow-up. We have been using Apligraf to the left foot wound along with [name] total contact cast. She has done well with this. T the right o heel wound she has been using Aquacel Ag and Keystone antibiotic ointment. She has no issues or complaints today. She denies signs of infection. 1/31; patient presents for follow-up. We have been using Apligraf to the left foot wound along with the total contact cast. She continues to do well with this. To the right heel we have been using Aquacel Ag and Keystone antibiotic ointment. She has no issues or complaints today. 12-01-2022 upon evaluation patient is seen today on my schedule due to the fact that she unfortunately was in the hospital yesterday. Her cast needed to come off the only reason she is out of the hospital is due to the fact that they would not take it off at the hospital which is somewhat bewildered reading to me to be perfectly honest. I am not certain why this was but either way she was released and then was placed on my schedule today in order to get this off and reapply the total contact casting as appropriate. I do not have an Apligraf for her today it was applied last week and today's actually expired yesterday as there was some scheduling conflicts with her being in the hospital. Nonetheless we do not have that for reapplication today but the good news that she is not draining too much and the Apligraf can go for up to 2 weeks so I am going to go ahead and reapply the total contact casting but we are going to leave the Apligraf in place. 2/14; patient presents for follow-up. T the left leg she  has had the total contact cast and Apligraf for the past week. She has had no issues with this. T the o o right heel she has been using antibiotic ointment and Aquacel Ag. 2/21; patient presents for follow-up. We have been using Apligraf and [name] total contact cast to the left lower extremity. She is tolerated this well. Unfortunately she is not approved for any more Apligraf per insurance. She has been using antibiotic ointment and Aquacel Ag to the right foot. She has no issues or complaints today. 2/28; patient presents for follow-up. We have been using Hydrofera Blue and antibiotic ointment under the total contact cast to the left lower extremity. He has been using Aquacel Ag with antibiotic ointment to the right plantar foot. She has no issues or complaints today. 3/6; patient presents for follow-up. She did not obtain her gentamicin ointment. She has been using Aquacel Ag to the right plantar foot wound. We have been using Hydrofera Blue with antibiotic ointment under the total contact cast to the left lower extremity. She has no issues or complaints today. 3/12; patient presents for follow-up. She has been using gentamicin ointment and Aquacel Ag to the right plantar foot wound. We have been using Hydrofera Blue with antibiotic ointment under the total contact cast on the left lower extremity foot wound. She has no issues or complaints today. 3/20; patient presents for follow-up. She has been using  gentamicin ointment and Aquacel Ag to the right plantar foot wound. We have been using Hydrofera Blue with antibiotic ointment under the total contact cast to the left lower extremity wound. She has no issues or complaints today. MCCOY, CHAO [name] ([id number]) [id number] Page 18 of 22 Objective Constitutional Vitals Time Taken: 11:04 AM, Height: 69 in, Weight: 178 lbs, BMI: 26.3, Temperature: 98.4 F, Pulse: 66 bpm, Respiratory Rate: 16 breaths/Heather, Blood Pressure: 121/81  mmHg. General Notes: Right foot: T the plantar heel there is an incision site with increased depth, No probing to bone. T the opening there is Nonviable tissue and o o granulation tissue. Left foot: T the medial aspect there is an open wound with granulation tissue, Callus and nonviable tissue. No overt signs of infection to o any of the wound beds. Integumentary (Hair, Skin) Wound #11 status is Open. Original cause of wound was Surgical Injury. The date acquired was: 12/01/2021. The wound has been in treatment 43 weeks. The wound is located on the [place]. The wound measures 1cm length x 0.7cm width x 0.5cm depth; 0.55cm^2 area and 0.275cm^3 volume. There is Fat Layer (Subcutaneous Tissue) exposed. There is no tunneling or undermining noted. There is [name] medium amount of serosanguineous drainage noted. The wound margin is distinct with the outline attached to the wound base. There is medium (34-66%) pale granulation within the wound bed. There is [name] medium (34-66%) amount of necrotic tissue within the wound bed. Wound #12 status is Open. Original cause of wound was Pressure Injury. The date acquired was: 03/16/2020. The wound has been in treatment 43 weeks. The wound is located on the [place]. The wound measures 1cm length x 0.6cm width x 0.1cm depth; 0.471cm^2 area and 0.047cm^3 volume. There is Fat Layer (Subcutaneous Tissue) exposed. There is no tunneling or undermining noted. There is [name] medium amount of serous drainage noted. The wound margin is flat and intact. There is small (1-33%) red, pink granulation within the wound bed. There is [name] large (67-100%) amount of necrotic tissue within the wound bed including Adherent Slough. 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 I debrided nonviable tissue and recommended continuing the course with antibiotic ointment and Hydrofera Blue under the total contact cast. The right plantar foot is stable. I debrided nonviable tissue. I again recommended continuing antibiotic ointment with Aquacel Ag. Continue aggressive offloading with right offloading heel shoe. Follow-up in 1 week. Procedures Wound #11 Pre-procedure diagnosis of Wound #11 is an Open Surgical Wound located on the Right,Plantar Foot . There was [name] Selective/Open Wound Non-Viable Tissue Debridement with [name] total area of 0.7 sq cm performed by Kalman Shan, MD. With the following instrument(s): Curette to remove Viable and Non-Viable tissue/material. Material removed includes [place]. [name] time out was conducted at 11:27, prior to the start of the procedure. [name] Minimum amount of bleeding was controlled with Pressure. The procedure was tolerated well. Post Debridement Measurements: 1cm length x 0.7cm width x 0.5cm depth; 0.275cm^3 volume. Character of Wound/Ulcer Post Debridement is stable. Post procedure Diagnosis Wound #11: Same as Pre-Procedure Wound #12 Pre-procedure diagnosis of Wound #12 is [name] Diabetic Wound/Ulcer of the Lower Extremity located  on the [place] .Severity of Tissue Pre Debridement is: Fat layer exposed. There was [name] Excisional Skin/Subcutaneous Tissue Debridement with [name] total area of 1.5 sq cm performed by Kalman Shan, MD. With the following instrument(s): Curette to remove Viable and Non-Viable tissue/material. Material removed includes Callus, Subcutaneous Tissue, and Slough. [name] time out was conducted at 11:25, prior to the start of the procedure. [name] Minimum amount of bleeding was controlled with Pressure. The procedure was tolerated well. Post  Debridement Measurements: 1cm length x 0.6cm width x 0.1cm depth; 0.047cm^3 volume. Character of Wound/Ulcer Post Debridement is stable. Severity of Tissue Post Debridement is: Fat layer exposed. Post procedure Diagnosis Wound #12: Same as Pre-Procedure Pre-procedure diagnosis of Wound #12 is [name] Diabetic Wound/Ulcer of the Lower Extremity located on the Left,Medial,Plantar Foot . There was [name] T Contact otal Cast Procedure by Kalman Shan, MD. Post procedure Diagnosis Wound #12: Same as Pre-Procedure Plan Follow-up Appointments: Return Appointment in 1 week. Nurse Visit as needed Bathing/ Shower/ Hygiene: BOWEN, BONTON ([id number]) [id number] Page 19 of 22 May shower with wound dressing protected with water repellent cover or cast protector. No tub bath. Anesthetic (Use 'Patient Medications' Section for Anesthetic Order Entry): Lidocaine applied to wound bed Edema Control - Lymphedema / Segmental Compressive Device / Other: Elevate, Exercise Daily and Avoid Standing for Long Periods of Time. Elevate legs to the level of the heart and pump ankles as often as possible Elevate leg(s) parallel to the floor when sitting. Off-Loading: T Contact Cast to Left Lower Extremity otal Open toe surgical shoe - Heel off loader - Right foot Other: - keep pressure off of feet. Additional Orders / Instructions: Follow Nutritious Diet and Increase Protein Intake Other: - Stay off of foot, wheelchair only Medications-Please add to medication list.: Wound #11 Right,Plantar Foot: Keystone Compound - use at home Topical Antibiotic - apply Gentamycin in office only WOUND #11: - Foot Wound Laterality: Plantar, Right Cleanser: Wound Cleanser 1 x Per Day/30 Days Discharge Instructions: Wash your hands with soap and water. Remove old dressing, discard into plastic bag and place into trash. Cleanse the wound with Wound Cleanser prior to applying [name] clean dressing using gauze  sponges, not tissues or cotton balls. Do not scrub or use excessive force. Pat dry using gauze sponges, not tissue or cotton balls. Topical: Gentamicin 1 x Per Day/30 Days Discharge Instructions: Apply as directed by provider. in clinic Prim Dressing: Aquacel Extra Hydrofiber Dressing, 2x2 (in/in) 1 x Per Day/30 Days ary Secondary Dressing: ABD Pad 5x9 (in/in) (Generic) 1 x Per Day/30 Days Discharge Instructions: Cover with ABD pad Secured With: Medipore T - 40M Medipore H Soft Cloth Surgical T ape ape, 2x2 (in/yd) 1 x Per Day/30 Days Secured With: [employer] Sterile or Non-Sterile 6-ply 4.5x4 (yd/yd) 1 x Per Day/30 Days Discharge Instructions: Apply Kerlix as directed WOUND #12: - Foot Wound Laterality: Plantar, Left, Medial Cleanser: Wound Cleanser 1 x Per Day/30 Days Discharge Instructions: Wash your hands with soap and water. Remove old dressing, discard into plastic bag and place into trash. Cleanse the wound with Wound Cleanser prior to applying [name] clean dressing using gauze sponges, not tissues or cotton balls. Do not scrub or use excessive force. Pat dry using gauze sponges, not tissue or cotton balls. Topical: Gentamicin 1 x Per Day/30 Days Discharge Instructions: Apply as directed by provider. in clinic Prim Dressing: Hydrofera Blue Ready Transfer Foam, 2.5x2.5 (in/in) 1 x Per Day/30 Days ary Discharge Instructions: Apply Hydrofera Blue Ready to  wound bed as directed Prim Dressing: Silvercel 4 1/4x 4 1/4 (in/in) 1 x Per Day/30 Days ary Discharge Instructions: Apply to periwound Secondary Dressing: ABD Pad 5x9 (in/in) (Generic) 1 x Per Day/30 Days Discharge Instructions: Cover with ABD pad Secured With: Medipore T - 60M Medipore H Soft Cloth Surgical T ape ape, 2x2 (in/yd) 1 x Per Day/30 Days Secured With: Kerlix Roll Sterile or Non-Sterile 6-ply 4.5x4 (yd/yd) 1 x Per Day/30 Days Discharge Instructions: Apply Kerlix as directed 1. In office sharp debridement 2. Hydrofera Blue  with antibiotic ointment under total contact cast to the left lower extremity 3. Gentamicin ointment with Aquacel Ag to the right foot 4. Aggressive offloadingoooffloading heel shoe to the right foot 5. Follow-up in 1 week Electronic Signature(s) Signed: 01/11/2023 2:38:02 PM By: Kalman Shan DO Entered By: Kalman Shan on 01/11/2023 12:27:23 -------------------------------------------------------------------------------- ROS/PFSH Details Patient Name: Date of Service: Heather Hitch [name]. 01/11/2023 11:00 [name]: [id number] Patient Account Number: [id number] Date of Birth/Sex: Treating RN: [birth year] (47 y.o. [name] Primary Care Provider: Raelene Bott Other Clinician: Massie Kluver Referring Provider: Treating Provider/Extender: Eddie North in Treatment: [address] [place] ([id number]) [id number] Page 20 of 22 Information Obtained From Patient Ear/Nose/Mouth/Throat Medical History: Positive for: Chronic sinus problems/congestion; Middle ear problems Hematologic/Lymphatic Medical History: Positive for: Anemia Respiratory Medical History: Positive for: Chronic Obstructive Pulmonary Disease (COPD) Cardiovascular Medical History: Positive for: Congestive Heart Failure - EF 33% Endocrine Medical History: Positive for: Type II Diabetes Time with diabetes: 14 years Treated with: Insulin, Oral agents Blood sugar tested every day: No Blood sugar testing results: Bedtime: 176 Genitourinary Medical History: Positive for: End Stage Renal Disease Integumentary (Skin) Medical History: Positive for: History of pressure wounds Neurologic Medical History: Positive for: Neuropathy - feel and legs HBO Extended History Items Ear/Nose/Mouth/Throat: Ear/Nose/Mouth/Throat: Chronic sinus Middle ear problems problems/congestion Immunizations Pneumococcal Vaccine: Received Pneumococcal Vaccination:  No Implantable Devices No devices added Family and Social History Current every day smoker; Marital Status - Single; Alcohol Use: Never; Drug Use: Prior History; Caffeine Use: Daily Electronic Signature(s) Signed: 01/11/2023 2:38:02 PM By: Kalman Shan DO Signed: 01/11/2023 5:15:10 PM By: Gretta Cool, BSN, RN, CWS, [name] RN, BSN Entered By: Kalman Shan on 01/11/2023 12:28:20 [name] ([id number]) [id number] Page 21 of 22 -------------------------------------------------------------------------------- Total Contact Cast Details Patient Name: Date of Service: JENEFFER, LEINEN 01/11/2023 11:00 [name]: [id number] Patient Account Number: [id number] Date of Birth/Sex: Treating RN: 1976/07/07 (47 y.o. [name] Primary Care Provider: Raelene Bott Other Clinician: Massie Kluver Referring Provider: Treating Provider/Extender: Eddie North in Treatment: 73 T Contact Cast Applied for Wound Assessment: otal Wound #12 Left,Medial,Plantar Foot Performed By: Physician Kalman Shan, MD Post Procedure Diagnosis Same as Pre-procedure Electronic Signature(s) Signed: 01/11/2023 2:38:02 PM By: Kalman Shan DO Signed: 01/11/2023 5:18:06 PM By: Massie Kluver Entered By: Massie Kluver on 01/11/2023 11:26:27 -------------------------------------------------------------------------------- SuperBill Details Patient Name: Date of Service: Michel Harrow 01/11/2023 Medical Record Number: [id number] Patient Account Number: [id number] Date of Birth/Sex: Treating RN: 05/06/1976 (47 y.o. [name] Primary Care Provider: Raelene Bott Other Clinician: Massie Kluver Referring Provider: Treating Provider/Extender: Eddie North in Treatment: 43 Diagnosis Coding ICD-10 Codes Code Description [phone] Non-pressure chronic ulcer of other part of left foot with other specified  severity 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.621 Type 2 diabetes mellitus with foot ulcer E11.42 Type  2 diabetes mellitus with diabetic polyneuropathy L97.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 of skin Facility Procedures : CPT4 Code: [id number] Description: [id number] - DEB SUBQ TISSUE 20 SQ CM/< ICD-10 Diagnosis Description 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i E11.621 Type 2 diabetes mellitus with foot ulcer Modifier: ty Quantity: 1 : ETHELREDA, KLITZKE Code: [id number] RISTI [name] [id number] Description: [id number] - DEBRIDE WOUND 1ST 20 SQ CM OR < 02) [id number] ICD-10 Diagnosis Description 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.621 Type 2 diabetes mellitus with foot ulcer Modifier: [id number] Quantity: 1 17.pdf Page 22 of 22 Physician Procedures : CPT4 Code Description Modifier [id number] - WC PHYS SUBQ TISS 20 SQ CM ICD-10 Diagnosis Description 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ity E11.621 Type 2 diabetes mellitus with foot ulcer Quantity: 1 : [id number] - WC PHYS DEBR WO ANESTH 20 SQ CM ICD-10 Diagnosis Description 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.621 Type 2 diabetes mellitus with foot ulcer Quantity: 1 Electronic Signature(s) Signed: 01/11/2023 2:38:02 PM By: Kalman Shan DO Entered By: Kalman Shan on 01/11/2023 12:27:54

## 2023-01-18 ENCOUNTER — Encounter (HOSPITAL_BASED_OUTPATIENT_CLINIC_OR_DEPARTMENT_OTHER): Payer: Medicaid Other | Admitting: Internal Medicine

## 2023-01-18 DIAGNOSIS — L97512 Non-pressure chronic ulcer of other part of right foot with fat layer exposed: Secondary | ICD-10-CM | POA: Diagnosis not present

## 2023-01-18 DIAGNOSIS — L97528 Non-pressure chronic ulcer of other part of left foot with other specified severity: Secondary | ICD-10-CM

## 2023-01-18 DIAGNOSIS — E11621 Type 2 diabetes mellitus with foot ulcer: Secondary | ICD-10-CM

## 2023-01-19 NOTE — Progress Notes (Signed)
[name] ([id number]) [id number] Page 1 of 20 Visit Report for 01/18/2023 Chief Complaint Document Details Patient Name: Date of Service: [name], [name] 01/18/2023 11:00 [name]: [id number] Patient Account Number: [id number] Date of Birth/Sex: Treating RN: Sep 26, 1976 (47 y.o. [name] Primary Care Provider: Raelene Bott Other Clinician: Massie Kluver Referring Provider: Treating Provider/Extender: Eddie North in Treatment: 30 Information Obtained from: Patient Chief Complaint 03/19/2021; patient referred by Dr. Luana Shu who has been looking after her left foot for quite [name] period of time for review of [name] nonhealing area in the left midfoot 03/12/2022; bilateral feet wounds and right lower extremity wound. Electronic Signature(s) Signed: 01/18/2023 11:48:34 AM By: Kalman Shan DO Entered By: Kalman Shan on 01/18/2023 11:36:09 -------------------------------------------------------------------------------- Debridement Details Patient Name: Date of Service: [name] 01/18/2023 11:00 [name]: [id number] Patient Account Number: [id number] Date of Birth/Sex: Treating RN: [birth year] (47 y.o. [name], [name] Primary Care Provider: Raelene Bott Other Clinician: Massie Kluver Referring Provider: Treating Provider/Extender: Eddie North in Treatment: 44 Debridement Performed for Assessment: Wound #12 Left,Medial,Plantar Foot Performed By: Physician Kalman Shan, MD Debridement Type: Debridement Severity of Tissue Pre Debridement: Fat layer exposed Level of Consciousness (Pre-procedure): Awake and Alert Pre-procedure Verification/Time Out Yes - 11:30 Taken: Start Time: 11:30 T Area Debrided (L x W): otal 1.5 (cm) x 1.5 (cm) = 2.25 (cm) Tissue and other material debrided: Viable, Non-Viable, Callus, Slough, Slough Level: Non-Viable  Tissue Debridement Description: Selective/Open Wound Instrument: Curette Bleeding: Minimum Hemostasis Achieved: Pressure Response to Treatment: Procedure was tolerated well Level of Consciousness (Post- Awake and Alert procedure): Post Debridement Measurements of Total Wound Length: (cm) 0.7 Width: (cm) 0.4 Depth: (cm) 0.1 Volume: (cm) 0.022 Character of Wound/Ulcer Post Debridement: Stable Severity of Tissue Post Debridement: Fat layer exposed Post Procedure Diagnosis Same as Pre-procedure Electronic Signature(s) Signed: 01/18/2023 11:48:34 AM By: Kalman Shan DO Signed: 01/18/2023 2:54:42 PM By: Gretta Cool, BSN, RN, CWS, [name] RN, BSN Signed: 01/19/2023 9:18:57 AM By: Massie Kluver Entered By: Massie Kluver on 01/18/2023 11:31:11 [name] ([id number]) [id number] Page 2 of 20 -------------------------------------------------------------------------------- Debridement Details Patient Name: Date of Service: [name], [name] 01/18/2023 11:00 [name]: [id number] Patient Account Number: [id number] Date of Birth/Sex: Treating RN: [birth year] (47 y.o. [name] Primary Care Provider: Raelene Bott Other Clinician: Massie Kluver Referring Provider: Treating Provider/Extender: Eddie North in Treatment: 44 Debridement Performed for Assessment: Wound #11 Right,Plantar Foot Performed By: Physician Kalman Shan, MD Debridement Type: Debridement Level of Consciousness (Pre-procedure): Awake and Alert Pre-procedure Verification/Time Out Yes - 11:33 Taken: Start Time: 11:33 T Area Debrided (L x W): otal 0.5 (cm) x 0.5 (cm) = 0.25 (cm) Tissue and other material debrided: Viable, Non-Viable, Slough, Subcutaneous, Slough Level: Skin/Subcutaneous Tissue Debridement Description: Excisional Instrument: Curette Bleeding: Minimum Hemostasis Achieved: Pressure Response to Treatment: Procedure was  tolerated well Level of Consciousness (Post- Awake and Alert procedure): Post Debridement Measurements of Total Wound Length: (cm) 0.5 Width: (cm) 0.5 Depth: (cm) 0.3 Volume: (cm) 0.059 Character of Wound/Ulcer Post Debridement: Stable Post Procedure Diagnosis Same as Pre-procedure Electronic Signature(s) Signed: 01/18/2023 11:48:34 AM By: Kalman Shan DO Signed: 01/18/2023 2:54:42 PM By: Gretta Cool, BSN, RN, CWS, [name] RN, BSN Signed: 01/19/2023 9:18:57 AM By: Massie Kluver Entered By: Massie Kluver on 01/18/2023 11:33:43 -------------------------------------------------------------------------------- HPI Details Patient Name: Date of Service: [name] 01/18/2023 11:00 [name]: [id number] Patient Account  Number: [id number] Date of Birth/Sex: Treating RN: February 14, 1976 (47 y.o. [name] Primary Care Provider: Raelene Bott Other Clinician: Massie Kluver Referring Provider: Treating Provider/Extender: Eddie North in Treatment: 67 History of Present Illness HPI Description: 01/18/18-She is here for initial evaluation of the left great toe ulcer. She is [name] poor historian in regards to timeframe in detail. She states approximately 4 weeks ago she lacerated her toe on something in the house. She followed up with her primary care who placed her on Bactrim and ultimately [name] second dose of Bactrim prior to coming to wound clinic. She states she has been treating the toe with peroxide, Betadine and [name] Band-Aid. She did not check her blood sugar this morning but checked it yesterday morning it was 327; she is unaware of [name] recent A1c and there are no current records. She saw Dr. she would've orthopedics last week for an old injury to the left ankle, she states he did not see her toe, nor did she bring it to his attention. She smokes approximately 1 pack cigarettes [name] day. Her social situation is concerning, she arrives this morning with her  mother who appears extremely intoxicated/under the influence; her mother was asked to leave the room and be monitored by the patient's grandmother. The patient's aunt then accompanied the patient and the room throughout the rest of the appointment. We had [name] lengthy discussion regarding the deleterious effects of uncontrolled hyperglycemia and smoking as it relates to wound healing and overall health. She was strongly encouraged to decrease her smoking and get her diabetes under better control. She states she is currently on [name] diet and has cut down her [facility] consumption. The left toe is erythematous, macerated and slightly edematous with malodor present. The edema in her left foot is below her baseline, there is no erythema streaking. We will treat her with Santyl, doxycycline; we have ordered and xray, culture and provided [name] Peg assist surgical shoe and cultured the wound. 01/25/18-She is here in follow-up evaluation for [name] left great toe ulcer and presents with an abscess to her suprapubic area. She states her blood sugars remain elevated, feeling "sick" and if levels are below 250, but she is trying. She has made no attempt to decrease her smoking stating that we "can't take away her food in her cigarettes". She has been compliant with offloading using the PEG assist you. She is using Santyl daily. the culture obtained last week grew staph aureus and Enterococcus faecalis; continues on the doxycycline and Augmentin was added on Monday. The suprapubic area has erythema, no femoral variation, purple discoloration, minimal induration, was accessed with [name] cotton tip applicator with sanguinopurulent drainage, this was cultured, I suspect the current antibiotic treatment will cover and we will not add anything to her current treatment plan. She was advised to go to urgent care or ER with any change in redness, induration or fever. LEONORE, [name] ([id number]) [id number]  Page 3 of 20 02/01/18-She is here in follow-up evaluation for left great toe ulcers and [name] new abdominal abscess from last week. She was able to use packing until earlier this week, where she "forgot it was there". She states she was feeling ill with GI symptoms last week and was not taking her antibiotic. She states her glucose levels have been predominantly less than 200, with occasional levels between 200-250. She thinks this was contributing to her GI symptoms as they have resolved without intervention. There continues to be significant laceration  to left toe, otherwise it clinically looks stable/improved. There is now less superficial opening to the lateral aspect of the great toe that was residual blister. We will transition to [facility] to all wounds, she will continue her Augmentin. If there is no change or deterioration next week for reculture. 02/08/18-She is here in follow-up evaluation for left great toe ulcer and abdominal ulcer. There is an improvement in both wounds. She has been wrapping her left toe with coban, not by our direction, which has created an area of discoloration to the medial aspect; she has been advised to NOT use coban secondary to her neuropathy. She states her glucose levels have been high over this last week ranging from 200-350, she continues to smoke. She admits to being less compliant with her offloading shoe. We will continue with same treatment plan and she will follow-up next week. 02/15/18-She is here in follow-up evaluation for left great toe ulcer and abdominal ulcer. The abdominal ulcer is epithelialized. The left great toe ulcer is improved and all injury from last week using the Coban wrap is resolved, the lateral ulcer is healed. She admits to noncompliance with wearing offloading shoe and admits to glucose levels being greater than 300 most of the week. She continues to smoke and expresses no desire to quit. There is one area medially that probes deeper  than it has historically, erythema to the toe and dorsal foot has consistently waxed and waned. There is no overt signs of cellulitis or infection but we will culture the wound for any occult infection given the new area of depth and erythema. We will hold off on sensitivities for initiation of antibiotic therapy. 02/22/18-She is here in follow up evaluation for left great toe ulcer. There is overall significant improvement in both wound appearance, erythema and edema with changes made last week. She was not initiated on antibiotic therapy. Culture obtained last week showed oxacillin sensitive staph aureus, sensitive to clindamycin. Clindamycin has been called into the pharmacy but she has been instructed to hold off on initiation secondary to overall clinical improvement and her history of antibiotic intolerance. She has been instructed to contact the clinic with any noted changes/deterioration and the wound, erythema, edema and/or pain. She will follow-up next week. She continues to smoke and her glucose levels remain elevated >250; she admits to compliance with offloading shoe 03/01/18 on evaluation today patient appears to be doing fairly well in regard to her left first toe ulcer. She has been tolerating the dressing changes with the [facility] Dressing without complication and overall this has definitely showed signs of improvement according to records as well is what the patient tells me today. I'm very pleased in that regard. She is having no pain today 03/08/18 She is here for follow up evaluation of [name] left great toe ulcer. She remains non-compliant with glucose control and smoking cessation; glucose levels consistently >200. She states that she got new shoe inserts/peg assist. She admits to compliance with offloading. Since my last evaluation there is significant improvement. We will switch to prisma at this time and she will follow up next week. She is noted to be tachycardic at this  appointment, heart rate 120s; she has [name] history of heart rate 70-130 according to our records. She admits to extreme agitation r/t personal issues; she was advised to monitor her heartrate and contact her physician if it does not return to [name] more normal range (<100). She takes cardizem twice daily. 03/15/18-She is here in  follow-up evaluation for left great toe ulcer. She remains noncompliant with glucose control and smoking cessation. She admits to compliance with wearing offloading shoe. The ulcer is improved/stable and we will continue with the same treatment plan and she will follow-up next week 03/22/18-She is here for evaluation for left great toe ulcer. There continues to be significant improvement despite recurrent hyperglycemia (over 500 yesterday) and she continues to smoke. She has been compliant with offloading and we will continue with same treatment plan and she will follow-up next week. 03/29/18-She is here for evaluation for left great toe ulcer. Despite continuing to smoke and uncontrolled diabetes she continues to improve. She is compliant with offloading shoe. We will continue with the same treatment plan and she will follow-up next week 04/05/18- She is here in follow up evaluation for [name] left great toe ulcer; she presents with small pustule to left fifth toe (resembles ant bite). She admits to compliance with wearing offloading shoe; continues to smoke or have uncontrolled blood glucose control. There is more callus than usual with evidence of bleeding; she denies known trauma. 04/12/18-She is here for evaluation of left great toe ulcer. Despite noncompliance with glycemic control and smoking she continues to make improvement. She continues to wear offloading shoe. The pustule, that was identified last week, to the left fifth toe is resolved. She will follow-up in 2 weeks 05/03/18-she is seen in follow-up evaluation for [name] left great toe ulcer. She is compliant with offloading, otherwise  noncompliant with glycemic control and smoking. She has plateaued and there is minimal improvement noted. We will transition to [facility], replaced the insert to her surgical shoe and she will follow-up in one week 05/10/18- She is here in follow up evaluation for [name] left great toe ulcer. It appears stable despite measurement change. We will continue with same treatment plan and follow up next week. 05/24/18-She is seen in follow-up evaluation for [name] left great toe ulcer. She remains compliant with offloading, has made significant improvement in her diet, decreasing the amount of sugar/soda. She said her recent A1c was 10.9 which is lower than. She did see [name] diabetic nutritionist/educator yesterday. She continues to smoke. We will continue with the same treatment plan and she'll follow-up next week. 05/31/18- She is seen in follow-up evaluation for left great toe ulcer. She continues to remain compliant with offloading, continues to make improvement in her diet, increasing her water and decreasing the amount of sugar/soda. She does continue to smoke with no desire to quit. We will apply Prisma to the depth and Hydrofera Blue over. We have not received insurance authorization for oasis. She will follow up next week. 06/07/18-She is seen in follow-up evaluation for left great toe ulcer. It has stalled according to today's measurements although base appears stable. She says she saw [name] diabetic educator yesterday; her average blood sugars are less than 300 which is an improvement for her. She continues to smoke and states "that's my next step" She continues with water over soda. We will order for xray, culture and reinstate ace wrap compression prior to placing apligraf for next week. She is voicing no complaints or concerns. Her dressing will change to iodoflex over the next week in preparation for apligraf. 06/14/18-She is seen in follow-up evaluation for left great toe ulcer. Plain film x-ray performed last  week was negative for osteomyelitis. Wound culture obtained last week grew strep B and OSSA; she is initiated on keflex and cefdinir today; there is erythema to the  toe which could be from ace wrap compression, she has [name] history of wrapping too tight and has has been encouraged to maintain ace wraps that we place today. We will hold off on application of apligraf today, will apply next week after antibiotic therapy has been initiated. She admits today that she has resumed taking [name] shower with her foot/toe submerged in water, she has been reminded to keep foot/toe out of the bath water. She will be seen in follow up next week 06/21/18-she is seen in follow-up evaluation for left great toe ulcer. She is tolerating antibiotic therapy with no GI disturbance. The wound is stable. Apligraf was applied today. She has been decreasing her smoking, only had 4 cigarettes yesterday and 1 today. She continues being more compliant in diabetic diet. She will follow-up next week for evaluation of site, if stable will remove at 2 weeks. 06/28/18- She is here in follow up evalution. Apligraf was placed last week, she states the dressing fell off on Tuesday and she was dressing with hydrofera blue. She is healed and will be discharged from the clinic today. She has been instructed to continue with smoking cessation, continue monitoring glucose levels, offloading for an additional 4 weeks and continue with hydrofera blue for additional two weeks for any possible microscopic opening. Readmission: 08/07/18 on evaluation today patient presents for reevaluation concerning the ulcer of her right great toe. She was previously discharged on 06/28/18 healed. Nonetheless she states that this began to show signs of drainage she subsequently went to her primary care provider. Subsequently an x-ray was performed on 08/01/18 which was negative. The patient was also placed on antibiotics at that time. Fortunately they should have been  effective for the infection. Nonetheless she's been experiencing some improvement but still has [name] lot of drainage coming from the wound itself. 08/14/18 on evaluation today patient's wound actually does show signs of improvement in regard to the erythema at this point. She has completed the antibiotics. With that being said we did discuss the possibility of placing her in [name] total contact cast as of today although I think that I may want to give this just [name] little bit more time to ensure nothing recurrence as far as her infection is concerned. I do not want to put in the cast and risk infection at that time if things are not completely resolved. With that being said she is gonna require some debridement today. 08/21/18 on evaluation today patient actually appears to be doing okay in regard to her toe ulcer. She's been tolerating the dressing changes without complication. With that being said it does appear that she is ready and in fact I think it's appropriate for [place] to go ahead and initiate the total contact cast today. Nonetheless she will require some sharp debridement to prepare the wound for application. Overall I feel like things have been progressing well but we do need to do something to get this to close more readily. 08/24/18 patient seen today for reevaluation after having had the total contact cast applied on Tuesday. She seems to have done very well the wound appears to be doing great and overall I'm pleased with the progress that she's made. There were no abnormal areas of rubbing from the cast on her lower extremity. 08/30/18 on evaluation today patient actually appears to be completely healed in regard to her plantar toe ulcer. She tells me at this point she's been having [name] lot of issues with the cast. She almost fell  [name] couple of times the state shall the step of her dog [name] couple times as well. This is been [name] very frustrating process for her other nonetheless she has completely healed the  wound which is excellent news. Overall there does not appear to be the evidence of infection at this time [name], [name] ([id number]) [id number] Page 4 of 20 which is great news. 09/11/18 evaluation today patient presents for follow-up concerning her great toe ulcer on the left which has unfortunately reopened since I last saw her which was only [name] couple of weeks ago. Unfortunately she was not able to get in to get the shoe and potentially the AFO that's gonna be necessary due to her left foot drop. She continues with offloading shoe but this is not enough to prevent her from reopening it appears. When we last had her in the total contact cast she did well from [name] healing standpoint but unfortunately the wound reopened as soon as she came out of the cast within just [name] couple of weeks. 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 This is also part of the reason why she falls on [name] regular basis. I just do not believe that is gonna be safe for [place] to reinitiate the total contact cast as last time we had this on she fell 3 times one week which is definitely not normal for her. 09/18/18 upon evaluation today the patient actually appears to be doing about the same in regard to her toe ulcer. She did not contact [facility] as I asked her to even though I had given her the prescription. In fact she actually states that she has no idea where the prescription is. She did apparently call [facility] and they told her that all she needed to do was bring the prescription in order to be able to be seen and work on getting the AFO for her left foot. With all that being said she still does not have an appointment and I'm not sure were things stand that regard. I will give her [name] new prescription today in  order to contact them to get this set up. 09/25/18 on evaluation today patient actually appears to be doing about the same in regard to her toes ulcer. She does have [name] small areas which seems to have [name] lot of callous buildup around the edge of the wound which is going to need sharp debridement today. She still is waiting to be scheduled for evaluation with [facility] for possibility of an AFO. She states there supposed to call her tomorrow to get this set up. Unfortunately it does appear that her foot specifically the toe area is showing signs of erythema. There does not appear to be any systemic infection which is in these good news. 10/02/18 on evaluation today patient actually appears to be doing about the same in regard to her toe ulcer. This really has not done too well although it's not significantly larger it's also not significantly smaller. She has been tolerating the dressing changes without complication. She actually has her appointment with [facility] and [place] [facility] tomorrow to hopefully be measured for obtaining and AFO splint. I think this would be helpful preventing this from reoccurring. We had contemplated starting the cast this week although to be honest I  am reluctant to do that as she's been having nausea, vomiting, and seizure activity over the past three days. She has [name] history of seizures and have been told is nothing that can be done for these. With that being said I do believe that along with the seizures have the nausea vomiting which upon further questioning doesn't seem to be the normal for her and makes me concerned for the possibility of infection or something else going on. I discussed this with the patient and her mother during the office visit today. I do not feel the wound is effective but maybe something else. The responses this was "this just happens to her at times and we don't know why". They did not seem to be interested in going to the hospital to have this checked out  further. 10/09/18 on evaluation today patient presents for follow-up concerning her ongoing toe ulcer. She has been tolerating the dressing changes without complication. Fortunately there does not appear to be any evidence of infection which is great news however I do think that the patient would benefit from going ahead for with the total contact cast. She's actually in [name] wheelchair today she tells me that she will use her walker if we initiate the cast. I was very specific about the fact that if we were gonna do the cast I wanted to make sure that she was using the walker in order to prevent any falls. She tells me she does not have stairs that she has to traverse on [name] regular basis at her home. She has not had any seizures since last week again that something that happens to her often she tells me she did talk to [employer] and they said that it may take up to three weeks to get the brace approved for her. Hopefully that will not take that long but nonetheless in the meantime I do think the cast could be of benefit. 10/12/18 on evaluation today patient appears to be doing rather well in regard to her toe ulcer. It's just been [name] few days and already this is significantly improved both as far as overall appearance and size. Fortunately there's no sign of infection. She is here for her first obligatory cast change. 10/19/18 Seen today for follow up and management of left great toe ulcer. Wound continues to show improvement. Noted small open area with seroussang drainage with palpation. Denies any increased pain or recent fevers during visit. She will continue calcium alginate with offloading shoe. Denies any questions or concerns during visit. 10/26/18 on evaluation today patient appears to be doing about the same as when I last saw her in regard to her wound bed. Fortunately there does not appear to be any signs of infection. Unfortunately she continues to have [name] breakdown in regard to the toe region any  time that she is not in the cast. It takes almost no time at all for this to happen. Nonetheless she still has not heard anything from the brace being made by [employer] as to when exactly this will be available to her. Fortunately there is no signs of infection at this time. 10/30/18 on evaluation today patient presents for application of the total contact cast as we just received him this morning. Fortunately we are gonna be able to apply this to her today which is great news. She continues to have no significant pain which is good news. Overall I do feel like things have been improving while she was the cast is when  she doesn't have [name] cast that things get worse. She still has not really heard anything from [place] regarding her brace. 11/02/18 upon evaluation today patient's wound already appears to be doing significantly better which is good news. Fortunately there does not appear to be any signs of infection also good news. Overall I do think the total contact cast as before is helping to heal this area unfortunately it's just not gonna likely keep the area closed and healed without her getting her brace at least. Again the foot drop is [name] significant issue for her. 11/09/18 on evaluation today patient appears to be doing excellent in regard to her toe ulcer which in fact is completely healed. Fortunately we finally got the situation squared away with the paperwork which was needed to proceed with getting her brace approved by Medicaid. I have filled that out unfortunately that information has been sent to the orthopedic office that I worked at 2 1/2 years ago and not tired Current wound care measures. Fortunately she seems to be doing very well at this time. 11/23/18 on evaluation today patient appears to be doing More Poorly Compared to Last Time I Saw Her. At [facility] She Had Completely Healed. Currently she is continuing to have issues with reopening. She states that she just found out that the brace  was approved through Medicaid now she just has to go get measured in order to have this fitted for her and then made. Subsequently she does not have an appointment for this yet that is going to complicate things we obviously cannot put her back in the cast if we do not have everything measured because they're not gonna be able to measure her foot while she is in the cast. 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 Obviously this is unfortunate and does have me somewhat worried as well. 11/30/18 on evaluation today patient's toe ulcer actually appears to be doing fairly well. The good news is she will be getting her brace in the shoes next week on Wednesday. Hopefully we will be able to get this to heal without having to go back in the cast however she may need the cast in order to get the wound completely heal and then go from there. Fortunately there's no signs of infection at this time. 12/07/18 on evaluation today patient fortunately did receive her brace and she states she could tell this definitely makes her walk better. With that being said she's been having issues with her toe where she noticed yesterday there was [name] lot of tissue that was loosing off this appears to be much larger than what it was previous. She also states that her leg has been read putting much across the top of her foot just about the ankle although this seems to be receiving somewhat. The total area is still red and appears to be someone infected as best I can tell. She is previously taken Bactrim and that may be [name] good option for her today as well. We are gonna see what I wound culture shows as well and I think that this is definitely appropriate. With that being said outside of the culture I still need to initiate something in the interim and that's what I'm gonna go ahead and select Bactrim is [name] good option for her. 12/14/18 on  evaluation today patient appears to be doing better in regard  to her left great toe ulcer as compared to last week's evaluation. There's still some erythema although this is significantly improved which is excellent news. Overall I do believe that she is making good progress is still gonna take some time before she is where I would like her to be from the standpoint of being able to place her back into the total contact cast. Hopefully we will be where we need to be by next week. 12/21/18 on evaluation today patient actually appears to be doing poorly in regard to her toe ulcer. She's been tolerating the dressing changes without complication. Fortunately there's no signs of systemic infection although she does have [name] lot of drainage from the toe ulcer and this does seem to be causing some issues at this point. She does have erythema on the distal portion of her toe that appears to be likely cellulitis. 12/28/18 on evaluation today patient actually appears to be doing [name] little better in my pinion in regard to her toe ulcer. With that being said she still does have Kant, Evin [name] ([id number]) [id number] Page 5 of 20 some evidence of infection at this time and for her culture she had both E. coli as well as enterococcus as organisms noted on evaluation. For that reason I think that though the Keflex likely has treated the E. coli rather well this has really done nothing for the enterococcus. We are going to have to initiate treatment for this specifically. 01/04/19 on evaluation today patient's toe actually appears to be doing better from the standpoint of infection. She currently would like to see about putting the cash back on I think that this is appropriate as long as she takes care of it and keeps it from getting wet. She is gonna have some drainage we can definitely pass this up with Drawtex and alginate to try to prevent as much drainage as possible from causing the  problems. With that being said I do want to at least try her with the cast between now and Tuesday. If there any issues we can't continue to use it then I will discontinue the use of the cast at that point. 01/08/19 on evaluation today patient actually appears to be doing very well as far as her foot ulcer specifically the great toe on the left is concerned. She did have an area of rubbing on the medial aspect of her left ankle which again is from the cast. Fortunately there's no signs of infection at this point in this appears to be [name] very slight skin breakdown. The patient tells me she felt it rubbing but didn't think it was that bad. Fortunately there is no signs of active infection at this time which is good news. No fevers, chills, nausea, or vomiting noted at this time. 01/15/19 on evaluation today patient actually appears to be doing well in regard to her toe ulcer. Again as previous she seems to do well and she has the cast on which indicates to me that during the time she doesn't have [name] cast on she's putting way too much pressure on this region. Obviously I think that's gonna be an issue as with the current national emergency concerning the Covid-19 Virus it has been recommended that we discontinue the use of total contact casting by the chief medical officer of our company, Dr. Simona Huh. The reasoning is that if [name] patient becomes sick and cannot come into have the cast removed they could not just leave this on for an additional  two weeks. Obviously the hospitals also do not want to receive patient's who are sick into the emergency department to potentially contaminate the region and spread the Covid-19 Virus among other sick individuals within the hospital system. Therefore at this point we are suspending the use of total contact cast until the current emergency subsides. This was all discussed with the patient today as well. 01/22/19 on evaluation today patient's wound on her left great toe  appears to be doing slightly worse than previously noted last week. She tells me that she has been on this quite [name] bit in fact she tells me she's been awake for 38 straight hours. This is due to the fact that she's having to care for grandparents because nobody else will. She has been taking care of them for five the last seven days since I've seen her they both have dementia his is from [name] stroke and her grandmother's was progressive. Nonetheless she states even her mom who knows her condition and situation has only help two of those days to take care of them she's been taking care of the rest. Fortunately there does not appear to be any signs of active infection in regard to her toe at this point although obviously it doesn't look as good as it did previous. I think this is directly related to her not taking off the pressure and friction by way of taking things easy. Though I completely understand what's going on. 01/29/19 on evaluation today patient's tools are actually appears to be showing some signs of improvement today compared to last week's evaluation as far as not necessarily the overall size of the wound but the fact that she has some new skin growth in between the two ends of the wound opening. Overall I feel like she has done well she states that she had [name] family member give her what sounds to be [name] CAM walker boot which has been helpful as well. 02/05/19 on evaluation today patient's wound bed actually appears to be doing significantly better in regard to her overall appearance of the size of the wound. With that being said she is still having an issue with offloading efficiently enough to get this to close. Apparently there is some signs of infection at this point as well unfortunately. Previously she's done well of Augmentin I really do not see anything that needs to be culture currently but there theme and cellulitis of the foot that I'm seeing I'm gonna go ahead and place her on an  antibiotic today to try to help clear this up. 02/12/2019 on evaluation today patient actually appears to be doing poorly in regard to her overall wound status. She tells me she has been using her offloading shoe but actually comes in today wearing her tennis shoe with the AFO brace. Again as I previously discussed with her this is really not sufficient to allow the area to heal appropriately. Nonetheless she continues to be somewhat noncompliant and I do wonder based on what she has told my nurse in the past as to whether or not [name] good portion of this noncompliance may be recreational drug and alcohol related. She has had [name] history of heroin overdose and this was fairly recently in the past couple of months that have been seeing her. Nonetheless overall I feel like her wound looks significantly worse today compared to what it was previous. She still has significant erythema despite the Augmentin I am not sure that this is an appropriate medication for  her infection I am also concerned that the infection may have gone down into her bone. 02/19/19 on evaluation today patient actually appears to be doing about the same in regard to her toe ulcer. Unfortunately she continues to show signs of bone exposure and infection at this point. There does not appear to be any evidence of worsening of the infection but I'm also not really sure that it's getting significantly better. She is on the Augmentin which should be sufficient for the Staphylococcus aureus infection that she has at this point. With that being said she may need IV antibiotics to more appropriately treat this. We did have [name] discussion today about hyperbaric option therapy. 02/28/19 on evaluation today patient actually appears to be doing much worse in regard to the wound on her left great toe as compared to even my previous evaluation last week. Unfortunately this seems to be training in [name] pretty poor direction. Her toe was actually now starting to  angle laterally and I can actually see the entire joint area of the proximal portion of the digit where is the distal portion of the digit again is no longer even in contact with the joint line. Unfortunately there's [name] lot more necrotic tissue around the edge and the toe appears to be showing signs of becoming gangrenous in my pinion. I'm very concerned about were things stand at this point. She did see infectious disease and they are planning to send in [name] prescription for Sivextro for her and apparently this has been approved. With that being said I don't think she should avoid taking this but at the same time I'm not sure that it's gonna be sufficient to save her toe at this point. She tells me that she still having to care for grandparents which I think is putting quite [name] bit of strain on her foot and specifically the total area and has caused this to break down even to [name] greater degree than would've otherwise been expected. 03/05/19 on evaluation today patient actually appears to be doing quite well in regard to her toe all things considering. She still has bone exposed but there appears to be much less your thing on overall the appearance of the wound and the toe itself is dramatically improved. She still does have some issues currently obviously with infection she did see vascular as well and there concerned that her blood flow to the toad. For that reason they are setting up for an angiogram next week. 03/14/19 on evaluation today patient appears to be doing very poor in regard to her toe and specifically in regard to the ulceration and the fact that she's starting to notice the toe was leaning even more towards the lateral aspect and the complete joint is visible on the proximal aspect of the joint. Nonetheless she's also noted [name] significant odor and the tip of the toe is turning more dark and necrotic appearing. Overall I think she is getting worse not better as far as this is concerned. For  that reason I am recommending at this point that she likely needs to be seen for likely amputation. READMISSION 03/19/2021 This is [name] patient that we cared for in this clinic for [name] prolonged period of time in 2019 and 2020 with [name] left foot and left first toe wound. 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 Luana Shu. In December 2021 she  had an ulcer on her right great toe as well as the fourth and fifth toes. 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 She also had an angiogram at that time and underwent angioplasty of the right anterior tibial artery. In any case she claims that the wound on the right foot is closed I did not look at this today which was probably an oversight although I think that should be done next week. 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 According to Dr. Deborra Medina last review that she was out of the area being cared for by another physician but recently came back to his attention. 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 [name] culture of this area showed E. coli apparently before she came back to see Dr. Luana Shu she was supposed to be receiving antibiotics but she did not really take them. Nor is she offloading this area at all. Finally her last hemoglobin A1c listed in epic was in March 2022 at 14.1 she says things are [name] lot better since then although I am not sure. 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 She had this wound at the time. During this admission she had E. coli sepsis [name] TEE was negative. 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 She is still [name] heavy cigarette smoker. She has [name] history of bipolar disorder seizure disorder ABI in our clinic was 1.05 [name], [name] ([id number]) [id number] Page 6 of 20 6/1; left midfoot in the setting of [name] TMA done previously. Round circular wound with [name] "knuckle" of protruding tissue. The problem is that the knuckle was not attached to any of the surrounding granulation and this probed proximally widely I removed [name] large portion of this tissue. This wound goes with considerable undermining laterally. I do not feel any bone there was no purulence but this is [name] deep wound. 6/8; in spite of the debridement I did last week. She arrives with [name] wound looking exactly the same. [name] protruding "knuckle" of tissue nonadherent to most of the surrounding tissue. There is considerable depth around this from 6-12 o'clock at 2.7 cm and undermining of 1 cm. This does not look overtly infected and the x- ray I did last week was negative for any osseous abnormalities. We have been using silver collagen 6/15; deep tissue culture I did last week showed moderate staph aureus and moderate Pseudomonas. This will definitely require prolonged antibiotic therapy. The pathology on the protuberant area was negative for malignancy fungus etc. 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 We have been using silver collagen. I am going to be prescribing Levaquin for 2 weeks. Her CT scan of the foot is down for 7/5 6/22; CT scan of the foot on 7 5. She says she has hardware in the left leg from her previous fracture. She is on the Levaquin for the deep tissue culture I did that showed methicillin sensitive staph aureus and Pseudomonas. I gave her [name] 2-week supply and she will have another week. She arrives in clinic today with the same  protuberant tissue however this is nonadherent  to the tissue surrounding it. I am really at [name] loss to explain this unless there is underlying deep tissue infection 6/29; patient presents for 1 week follow-up. She has been using collagen to the wound bed. She reports taking her antibiotics as prescribed.She has no complaints or issues today. She denies signs of infection. 7/6; patient presents for one week followup. She has been using collagen to the wound bed. She states she is taking Levaquin however at times she is not able to keep it down. She denies signs of infection. 7/13; patient presents for 1 week follow-up. She has been using silver alginate to the wound bed. She still has nausea when taking her antibiotics. She denies signs of infection. 7/20; patient presents for 1 week follow-up. She has been using silver alginate with gentamicin cream to the wound bed. She denies any issues and has no complaints today. She denies signs of infection. 7/27; patient presents for 1 week follow-up. She continues to use silver alginate with gentamicin cream to the wound bed. She reports starting her antibiotics. She has no issues or complaints. Overall she reports stability to the wound. 8/3; patient presents for 1 week follow-up. She has been using silver alginate with gentamicin cream to the wound bed. She reports completing all antibiotics. She has no issues or complaints today. She denies signs of infection. 8/17; patient presents for 2-week follow-up. He is to use silver alginate to the wound bed. She has no issues or complaints today. She denies signs of infection. She reports her pain has improved in her foot since last clinic visit 8/24; patient presents for 1 week follow-up. She continues to use silver alginate to the wound bed. She has no issues or complaints. She denies signs of infection. Pain is stable. 9/7; patient presents for follow-up. She missed her last week appointment due to feeling  ill. She continues to use silver alginate. She has [name] new wound to the right lower extremity that is covered in eschar. She states It occurred over the past week and has no idea how it started. She currently denies signs of infection. 9/14; patient presents for follow-up. T the left foot wound she has been using gentamicin cream and silver alginate. T the right lower extremity wound she has o o been keeping this covered and has not obtain Santyl. 9/21; patient presents for follow-up. She reports using gentamicin cream and silver alginate to the left foot and Santyl to the right lower extremity wound. She has no issues or complaints today. She denies signs of infection. 9/28; patient presents for follow-up. She reports [name] new wound to her right heel. She states this occurred [name] few days ago and is progressively gotten worse. She has been trying to clean the area with [name] Q-tip and Santyl. She reports stability in the other 2 wounds. She has been using gentamicin cream and silver alginate to the left foot and Santyl to the right lower extremity wound. 10/12; patient presents for follow-up. She reports improvement to the wound beds. She is seeing vein and vascular to discuss the potential of [name] left BKA. She states they are going to do an arteriogram. She continues to use silver alginate with dressing changes to her wounds. 11/2; patient presents for follow-up. She states she has not been doing dressing changes to the wound beds. She states she is not able to offload the areas. She reports chronic pain to her left foot wound. 11/9; patient presents for follow-up. She came in with only  socks on. She states she forgot to put on shoes. It is unclear if she is doing any dressing changes. She currently denies systemic signs of infection. 11/16; patient presents for follow-up. She came again only with socks on. She states she does not wear shoes ever. It is unclear if she does dressing changes. She currently  denies systemic signs of infection. 11/23; patient presents for follow-up. She wore her shoes today. It still unclear exactly what dressing she is using for each wound but she did states she obtained Dakin's solution and has been using this to the left foot wound. She currently denies signs of infection. 11/30; patient presents for follow-up. She has no issues or complaints today. She currently denies signs of infection. 12/7; patient presents for follow-up. She has no issues or complaints today. She has been using Hydrofera Blue to the right heel wound and Dakin solution to the left foot wound. Her right anterior leg wound is healed. She currently denies signs of infection. 12/14; patient presents for follow-up. She has been using Hydrofera Blue to the right heel and Dakin's to the left foot wounds. She has no issues or complaints today. She denies signs of infection. 12/21; patient presents for follow-up. She reports using Hydrofera Blue to the right heel and Dakin's to the left foot wound. She denies signs of infection. 12/28; patient presents for follow-up. She continues to use Dakin's to the left foot wound and Hydrofera Blue to the right heel wound. She denies signs of infection. 1/4; patient presents for follow-up. She has no issues or complaints today. She denies signs of infection. 1/11; patient presents for follow-up. It is unclear if she has been dressing these wounds over the past week. She currently denies signs of infection. 1/18; patient presents for follow-up. She states she has been using Dakin's wet-to-dry dressings to the left foot. She has been using Hydrofera Blue to the right foot foot wound. She states that the anterior right leg wound has reopened and draining serous fluid. She denies signs of infection. 1/25; patient presents for follow-up. She has no issues or complaints today. 2/1; patient presents for follow-up. She has no issues or complaints today. She denies signs of  infection. 2/8; patient presents for follow-up. She has lost her surgical shoes. She did not have [name] dressing to the right heel wound. She currently denies signs of infection. 2/15; patient presents for follow-up. She reports more pain to the right heel today. She denies purulent drainage Or fever/chills 2/22; patient presents for follow-up. She reports taking clindamycin over the past week. She states that she continues to have pain to her right heel. She reports purulent drainage. Readmission 03/16/2022 Ms. Tacara Mazzaro is [name] 47 year old female with [name] past medical history of type 2 diabetes, osteomyelitis to her feet, chronic systolic heart failure and bipolar disorder that presents to the clinic for bilateral feet wounds and right lower extremity wound. She was last seen in our clinic on 12/15/2021. At that time she had VERNETT, KUCHAREK [name] ([id number]) [id number] Page 7 of 20 purulent drainage coming out of her right plantar foot and I recommended she go to the ED. She states she went to [facility] and has been there for the past 3 months. I cannot see the records. She states she had OR debridement and was on several weeks of IV antibiotics while inpatient. Since discharge she has not been taking care of the wound beds. She had nothing on her feet other than socks today.  She currently denies signs of infection. 5/31; patient presents for follow-up. She has been using Dakin's wet-to-dry dressings to the wound beds on her feet bilaterally and antibiotic ointment to the right anterior leg wound. She had [name] wound culture done at last clinic visit that showed moderate Pseudomonas aeruginosa sensitive to ciprofloxacin. She currently denies systemic signs of infection. 6/14; patient presents for follow-up. She received Keystone 5 days ago and has been using this on the wound beds. She states that last week she had to go to the hospital because she had increased warmth  and erythema to the right foot. She was started on 2 oral antibiotics. She states she has been taking these. She currently denies systemic signs of infection. She has no issues or complaints today. 6/21; patient presents for follow-up. She states she has been using Keystone antibiotics to the wound beds. She has no issues or complaints today. She denies signs of infection. 6/28; patient presents for follow-up. She has been using Keystone antibiotics to the wound beds. She has no issues or complaints today. 7/12; patient presents for follow-up. Has been using Keystone antibiotics to the wound beds with calcium alginate. She has no issues or complaints today. She never followed up with her orthopedic surgeon who did the OR debridement to the right foot. We discussed the total contact cast for the left foot and patient would like to do this next week. 7/19; patient presents for follow-up. She has been using Keystone antibiotics with calcium alginate to the wound beds. She has no issues or complaints today. Patient is in agreement to do the total contact cast of the left foot today. She knows to return later this week for the obligatory cast change. 05-13-2022 upon evaluation today patient's wound which she has the cast of the left leg actually appears to be doing significantly better. Fortunately I do not see any signs of active infection locally or systemically which is great news and overall I am extremely pleased with where we stand currently. 7/26; patient presents for follow-up. She has [name] cast in place for the past week. She states it irritated her shin. Other than that she tolerated the cast well. She states she would like [name] break for 1 week from the cast. We have been using Keystone antibiotic and Aquacel to both wound beds. She denies signs of infection. 8/2; patient presents for follow-up. She has been using Keystone and Aquacel to the wound beds. She denies any issues and has no complaints. She  is agreeable to have the cast placed today for the left leg. 06-03-2022 upon evaluation today patient appears to be doing well with regard to her wound she saw some signs of improvement which is great news. Fortunately I do not see any evidence of active infection locally or systemically at this time which is great news. No fevers, chills, nausea, vomiting, or diarrhea. 8/16; patient presents for follow-up. She has no issues or complaints today. We have been using Keystone and Aquacel to the wound beds. The left lower extremity is in [name] total contact cast. She is tolerated this well. 8/23; patient presents for follow-up. She has had the total contact cast on the left leg for the past week. Unfortunately this has rubbed and broken down the skin to the medial foot. She currently denies signs of infection. She has been using Keystone antibiotic to the right plantar foot wound. 8/30; patient presents for follow-up. We have held off on the total contact cast for the left  leg for the past week. Her wound on the left foot has improved and the previous surrounding breakdown of skin has epithelialized. She has been using Keystone antibiotic to both wound beds. She has no issues or complaints today. She denies signs of infection. 9/6; patient presents for follow-up. She has ordered her's Keystone antibiotic and this is arriving this week. She has been doing Dakin's wet-to-dry dressings to the wound beds. She denies signs of infection. She is agreeable to the total contact cast today. 9/13; patient presents for follow-up. She states that the cast caused her left leg shin to be sore. She would like to take [name] break from the cast this week. She has been using Keystone antibiotic to the right plantar foot wound. She denies signs of infection. 9/20; patient presents for follow-up. She has been using Keystone antibiotics to the wound beds with calcium alginate to the right foot wound and Hydrofera Blue to the left  foot wound. She is agreeable to having the cast placed today. She has been approved for Apligraf and we will order this for next clinic visit. 9/27; patient presents for follow-up. We have been using Keystone antibiotic with Hydrofera Blue to the left foot wound under [name] total contact cast. T the right o foot wound she has been using Keystone antibiotic and calcium alginate. She declines [name] total contact cast today. Apligraf is available for placement and she would like to proceed with this. 07-28-2022 upon evaluation today patient appears to be doing well currently in regard to her wound. She is actually showing signs of significant improvement which is great news. Fortunately I do not see any evidence of active infection locally nor systemically at this time. She has been seeing Dr. Heber Pitkin and to be honest has been doing very well with the cast. Subsequently she comes in today with [name] cast on and we did reapply that today as well. She did not really want to she try to talk me out of that but I explained that if she wanted to heal this is really the right way to go. Patient voiced understanding. In regard to her right foot this is actually [name] lot better compared to the last time I saw her which is also great news. 10/11; patient presents for follow-up. Apligraf and the total contact cast was placed to the left leg at last clinic visit. She states that her right foot wound had burning pain to it with the placement of Apligraf to this area. She has been doing [place] over this area. She denies signs of infection including increased warmth, erythema or purulent drainage. 11/1; 3-week follow-up. The patient fortunately did not have [name] total contact cast or an Apligraf and on the left foot. She has been using Keystone ABD pads and kerlix and her own running shoes She arrives in clinic today with thick callus and [name] very poor surface on the left foot on the right nonviable skin subcutaneous tissue and [name] deep  probing hole. 11/15; patient missed her last clinic appointment. She states she has not been dressing the wound beds for the past 2 weeks. She states that at she had [name] new roommate but is now going back to live with her mother. Apparently its been [name] distracting 2 weeks. Patient currently denies signs of infection. 11/22; patient presents for follow-up. She states she has been using Keystone antibiotic and Dakin's wet-to-dry dressings to the wound beds. She is agreeable for cast placement today. We had ordered Apligraf however this  has not been received by our facility. 11/29; Patient had [name] total contact cast placed at last clinic visit and she tolerated this well. We were using silver alginate under the cast. Patient's been using Keystone antibiotic with Aquacel to the right plantar foot wound. She has no issues or complaints today. Apligraf is available for placement today. Patient would like to proceed with this. 12/6; patient presents for follow-up. She had Apligraf placed in standard fashion last clinic visit under the total contact cast to the left lower extremity. She has been using Keystone antibiotic and Aquacel to the right plantar foot wound. She has no issues or complaints today. 12/13; patient presents for follow-up. She has finished 5 Apligraf placements. Was told she would not qualify for more. We have been doing [name] total contact cast to the left lower extremity. She has been using Keystone antibiotic and Aquacel to the right plantar foot wound. She has no issues or complaints today. CHAQUETTA, ROHMAN [name] ([id number]) [id number] Page 8 of 20 12/20; patient presents for follow-up. We have been using Hydrofera Blue with Keystone antibiotic under [name] total contact cast of the left lower extremity. She reports using Keystone antibiotic and silver alginate to the right heel wound. She has no issues or complaints today. 12/27; patient presents with [name] healthy wound on the  left midfoot. We have Apligraf to apply that to that more also using [name] total contact cast. On the right we are using Keystone and silver alginate. She is offloading the right heel with [name] surgical shoealthough by her admission she is on her feet quite [name] bit 1/3; patient presents for follow-up. Apligraf was placed to the wound beds last clinic visit. She was placed in [name] total contact cast to the left lower extremity. She declines [name] total contact cast today. She states that her mother is in the hospital and she cannot adequately get around with the cast on. 1/10; patient presents for follow-up. She declined the total contact cast at last clinic visit. Both wounds have declined in appearance. She states that she has been on her feet and not offloading the wound beds. She currently denies signs of infection. 1/17; patient presents for follow-up. She had the total contact cast along with Apligraf placed last week to the left lower extremity. She tolerated this well. She has been using Aquacel Ag and Keystone antibiotic to the right heel wound. She currently denies signs of infection. She has no issues or complaints today. 1/24; patient presents for follow-up. We have been using Apligraf to the left foot wound along with [name] total contact cast. She has done well with this. T the right o heel wound she has been using Aquacel Ag and Keystone antibiotic ointment. She has no issues or complaints today. She denies signs of infection. 1/31; patient presents for follow-up. We have been using Apligraf to the left foot wound along with the total contact cast. She continues to do well with this. To the right heel we have been using Aquacel Ag and Keystone antibiotic ointment. She has no issues or complaints today. 12-01-2022 upon evaluation patient is seen today on my schedule due to the fact that she unfortunately was in the hospital yesterday. Her cast needed to come off the only reason she is out of the hospital is due  to the fact that they would not take it off at the hospital which is somewhat bewildered reading to me to be perfectly honest. I am not certain why this was but  either way she was released and then was placed on my schedule today in order to get this off and reapply the total contact casting as appropriate. I do not have an Apligraf for her today it was applied last week and today's actually expired yesterday as there was some scheduling conflicts with her being in the hospital. Nonetheless we do not have that for reapplication today but the good news that she is not draining too much and the Apligraf can go for up to 2 weeks so I am going to go ahead and reapply the total contact casting but we are going to leave the Apligraf in place. 2/14; patient presents for follow-up. T the left leg she has had the total contact cast and Apligraf for the past week. She has had no issues with this. T the o o right heel she has been using antibiotic ointment and Aquacel Ag. 2/21; patient presents for follow-up. We have been using Apligraf and [name] total contact cast to the left lower extremity. She is tolerated this well. Unfortunately she is not approved for any more Apligraf per insurance. She has been using antibiotic ointment and Aquacel Ag to the right foot. She has no issues or complaints today. 2/28; patient presents for follow-up. We have been using Hydrofera Blue and antibiotic ointment under the total contact cast to the left lower extremity. He has been using Aquacel Ag with antibiotic ointment to the right plantar foot. She has no issues or complaints today. 3/6; patient presents for follow-up. She did not obtain her gentamicin ointment. She has been using Aquacel Ag to the right plantar foot wound. We have been using Hydrofera Blue with antibiotic ointment under the total contact cast to the left lower extremity. She has no issues or complaints today. 3/12; patient presents for follow-up. She has been  using gentamicin ointment and Aquacel Ag to the right plantar foot wound. We have been using Hydrofera Blue with antibiotic ointment under the total contact cast on the left lower extremity foot wound. She has no issues or complaints today. 3/20; patient presents for follow-up. She has been using gentamicin ointment and Aquacel Ag to the right plantar foot wound. We have been using Hydrofera Blue with antibiotic ointment under the total contact cast to the left lower extremity wound. She has no issues or complaints today. 3/27; patient presents for follow-up. We have been using antibiotic ointment and Aquacel Ag to the right plantar foot wound. We have been using Hydrofera Blue with antibiotic ointment under the total contact cast to the left lower extremity. Both wounds are smaller. Electronic Signature(s) Signed: 01/18/2023 11:48:34 AM By: Kalman Shan DO Entered By: Kalman Shan on 01/18/2023 11:39:42 -------------------------------------------------------------------------------- Physical Exam Details Patient Name: Date of Service: [name] 01/18/2023 11:00 [name]: [id number] Patient Account Number: [id number] Date of Birth/Sex: Treating RN: [birth year] (47 y.o. [name] Primary Care Provider: Raelene Bott Other Clinician: Massie Kluver Referring Provider: Treating Provider/Extender: Eddie North in Treatment: 16 Constitutional . Cardiovascular . Psychiatric . Notes Right foot: T the plantar heel there is an incision site with increased depth, No probing to bone. T the opening there is Nonviable tissue and granulation tissue. o o Left foot: T the medial aspect there is an open wound with granulation tissue, Callus and slough build up. No overt signs of infection to any of the wound o beds. Electronic Signature(s) Signed: 01/18/2023 11:48:34 AM By: Kalman Shan DO [name], Promyse [name] (  [id number])  [id number] Page 9 of 20 Entered By: Kalman Shan on 01/18/2023 11:40:12 -------------------------------------------------------------------------------- Physician Orders Details Patient Name: Date of Service: [name], [name] 01/18/2023 11:00 [name]: [id number] Patient Account Number: [id number] Date of Birth/Sex: Treating RN: Nov 02, 1975 (47 y.o. [name] Primary Care Provider: Raelene Bott Other Clinician: Massie Kluver Referring Provider: Treating Provider/Extender: Eddie North in Treatment: 27 Verbal / Phone Orders: Yes Clinician: Cornell Barman Read Back and Verified: Yes Diagnosis Coding Follow-up Appointments Return Appointment in 1 week. Nurse Visit as needed Bathing/ Shower/ Hygiene May shower with wound dressing protected with water repellent cover or cast protector. No tub bath. Anesthetic (Use 'Patient Medications' Section for Anesthetic Order Entry) Lidocaine applied to wound bed Edema Control - Lymphedema / Segmental Compressive Device / Other Elevate, Exercise Daily and [name] void Standing for Long Periods of Time. Elevate legs to the level of the heart and pump ankles as often as possible Elevate leg(s) parallel to the floor when sitting. Off-Loading Total Contact Cast to Left Lower Extremity Open toe surgical shoe - Heel off loader - Right foot Other: - keep pressure off of feet. Additional Orders / Instructions Follow Nutritious Diet and Increase Protein Intake Other: - Stay off of foot, wheelchair only Wound Treatment Wound #11 - Foot Wound Laterality: Plantar, Right Cleanser: Wound Cleanser 1 x Per Day/30 Days Discharge Instructions: Wash your hands with soap and water. Remove old dressing, discard into plastic bag and place into trash. Cleanse the wound with Wound Cleanser prior to applying [name] clean dressing using gauze sponges, not tissues or cotton balls. Do not scrub or use  excessive force. Pat dry using gauze sponges, not tissue or cotton balls. Topical: Gentamicin 1 x Per Day/30 Days Discharge Instructions: Apply as directed by provider. in clinic Prim Dressing: Aquacel Extra Hydrofiber Dressing, 2x2 (in/in) ary 1 x Per Day/30 Days Secondary Dressing: ABD Pad 5x9 (in/in) (Generic) 1 x Per Day/30 Days Discharge Instructions: Cover with ABD pad Secured With: Medipore T - 44M Medipore H Soft Cloth Surgical T ape ape, 2x2 (in/yd) 1 x Per Day/30 Days Secured With: [employer] Sterile or Non-Sterile 6-ply 4.5x4 (yd/yd) 1 x Per Day/30 Days Discharge Instructions: Apply Kerlix as directed Wound #12 - Foot Wound Laterality: Plantar, Left, Medial Cleanser: Wound Cleanser 1 x Per Week/30 Days Discharge Instructions: Wash your hands with soap and water. Remove old dressing, discard into plastic bag and place into trash. Cleanse the wound with Wound Cleanser prior to applying [name] clean dressing using gauze sponges, not tissues or cotton balls. Do not scrub or use excessive force. Pat dry using gauze sponges, not tissue or cotton balls. Topical: Gentamicin 1 x Per Week/30 Days Discharge Instructions: Apply as directed by provider. in clinic Prim Dressing: Hydrofera Blue Ready Transfer Foam, 2.5x2.5 (in/in) 1 x Per Week/30 Days ary Discharge Instructions: Apply Hydrofera Blue Ready to wound bed as directed Prim Dressing: Silvercel 4 1/4x 4 1/4 (in/in) 1 x Per Week/30 Days ary Discharge Instructions: Apply to periwound Secondary Dressing: ABD Pad 5x9 (in/in) (Generic) 1 x Per Week/30 Days MCKYNLEIGH, RIM [name] ([id number]) [id number] Page 10 of 20 Discharge Instructions: Cover with ABD pad Secured With: Medipore T - 44M Medipore H Soft Cloth Surgical T ape ape, 2x2 (in/yd) 1 x Per Week/30 Days Secured With: [employer] Sterile or Non-Sterile 6-ply 4.5x4 (yd/yd) 1 x Per Week/30 Days Discharge Instructions: Apply Kerlix as directed Electronic  Signature(s) Signed: 01/19/2023 9:18:57 AM By: Massie Kluver  Signed: 01/19/2023 10:38:15 AM By: Kalman Shan DO Previous Signature: 01/18/2023 11:48:34 AM Version By: Kalman Shan DO Entered By: Massie Kluver on 01/19/2023 09:14:44 -------------------------------------------------------------------------------- Problem List Details Patient Name: Date of Service: [name] 01/18/2023 11:00 [name]: [id number] Patient Account Number: [id number] Date of Birth/Sex: Treating RN: April 14, 1976 (47 y.o. 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 03/16/2022 No Yes E11.621 Type 2 diabetes mellitus with foot ulcer 03/16/2022 No Yes E11.42 Type 2 diabetes mellitus with diabetic polyneuropathy 03/16/2022 No Yes L97.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 03/16/2022 No Yes of skin Inactive Problems Resolved Problems Electronic Signature(s) Signed: 01/18/2023 11:48:34 AM By: Kalman Shan DO Entered By: Kalman Shan on 01/18/2023 11:36:04 -------------------------------------------------------------------------------- Progress Note Details Patient Name: Date of Service: [name] 01/18/2023 11:00 [name]: [id number] Patient Account Number: [id number] Date of Birth/Sex: Treating RN: [birth year] (47 y.o. [name] Primary Care Provider: Raelene Bott Other Clinician: Massie Kluver Referring Provider: Treating Provider/Extender: Eddie North in  Treatment: [address], [place] ([id number]) [id number] Page 11 of 20 Subjective Chief Complaint Information obtained from Patient 03/19/2021; patient referred by Dr. Luana Shu who has been looking after her left foot for quite [name] period of time for review of [name] nonhealing area in the left midfoot 03/12/2022; bilateral feet wounds and right lower extremity wound. History of Present Illness (HPI) 01/18/18-She is here for initial evaluation of the left great toe ulcer. She is [name] poor historian in regards to timeframe in detail. She states approximately 4 weeks ago she lacerated her toe on something in the house. She followed up with her primary care who placed her on Bactrim and ultimately [name] second dose of Bactrim prior to coming to wound clinic. She states she has been treating the toe with peroxide, Betadine and [name] Band-Aid. She did not check her blood sugar this morning but checked it yesterday morning it was 327; she is unaware of [name] recent A1c and there are no current records. She saw Dr. she would've orthopedics last week for an old injury to the left ankle, she states he did not see her toe, nor did she bring it to his attention. She smokes approximately 1 pack cigarettes [name] day. Her social situation is concerning, she arrives this morning with her mother who appears extremely intoxicated/under the influence; her mother was asked to leave the room and be monitored by the patient's grandmother. The patient's aunt then accompanied the patient and the room throughout the rest of the appointment. We had [name] lengthy discussion regarding the deleterious effects of uncontrolled hyperglycemia and smoking as it relates to wound healing and overall health. She was strongly encouraged to decrease her smoking and get her diabetes under better control. She states she is currently on [name] diet and has cut down her [facility] consumption. The left toe is erythematous, macerated and slightly  edematous with malodor present. The edema in her left foot is below her baseline, there is no erythema streaking. We will treat her with Santyl, doxycycline; we have ordered and xray, culture and provided [name]  Peg assist surgical shoe and cultured the wound. 01/25/18-She is here in follow-up evaluation for [name] left great toe ulcer and presents with an abscess to her suprapubic area. She states her blood sugars remain elevated, feeling "sick" and if levels are below 250, but she is trying. She has made no attempt to decrease her smoking stating that we "can't take away her food in her cigarettes". She has been compliant with offloading using the PEG assist you. She is using Santyl daily. the culture obtained last week grew staph aureus and Enterococcus faecalis; continues on the doxycycline and Augmentin was added on Monday. The suprapubic area has erythema, no femoral variation, purple discoloration, minimal induration, was accessed with [name] cotton tip applicator with sanguinopurulent drainage, this was cultured, I suspect the current antibiotic treatment will cover and we will not add anything to her current treatment plan. She was advised to go to urgent care or ER with any change in redness, induration or fever. 02/01/18-She is here in follow-up evaluation for left great toe ulcers and [name] new abdominal abscess from last week. She was able to use packing until earlier this week, where she "forgot it was there". She states she was feeling ill with GI symptoms last week and was not taking her antibiotic. She states her glucose levels have been predominantly less than 200, with occasional levels between 200-250. She thinks this was contributing to her GI symptoms as they have resolved without intervention. There continues to be significant laceration to left toe, otherwise it clinically looks stable/improved. There is now less superficial opening to the lateral aspect of the great toe that was residual blister. We  will transition to [facility] to all wounds, she will continue her Augmentin. If there is no change or deterioration next week for reculture. 02/08/18-She is here in follow-up evaluation for left great toe ulcer and abdominal ulcer. There is an improvement in both wounds. She has been wrapping her left toe with coban, not by our direction, which has created an area of discoloration to the medial aspect; she has been advised to NOT use coban secondary to her neuropathy. She states her glucose levels have been high over this last week ranging from 200-350, she continues to smoke. She admits to being less compliant with her offloading shoe. We will continue with same treatment plan and she will follow-up next week. 02/15/18-She is here in follow-up evaluation for left great toe ulcer and abdominal ulcer. The abdominal ulcer is epithelialized. The left great toe ulcer is improved and all injury from last week using the Coban wrap is resolved, the lateral ulcer is healed. She admits to noncompliance with wearing offloading shoe and admits to glucose levels being greater than 300 most of the week. She continues to smoke and expresses no desire to quit. There is one area medially that probes deeper than it has historically, erythema to the toe and dorsal foot has consistently waxed and waned. There is no overt signs of cellulitis or infection but we will culture the wound for any occult infection given the new area of depth and erythema. We will hold off on sensitivities for initiation of antibiotic therapy. 02/22/18-She is here in follow up evaluation for left great toe ulcer. There is overall significant improvement in both wound appearance, erythema and edema with changes made last week. She was not initiated on antibiotic therapy. Culture obtained last week showed oxacillin sensitive staph aureus, sensitive to clindamycin. Clindamycin has been called into the pharmacy but she  has been instructed to hold  off on initiation secondary to overall clinical improvement and her history of antibiotic intolerance. She has been instructed to contact the clinic with any noted changes/deterioration and the wound, erythema, edema and/or pain. She will follow-up next week. She continues to smoke and her glucose levels remain elevated >250; she admits to compliance with offloading shoe 03/01/18 on evaluation today patient appears to be doing fairly well in regard to her left first toe ulcer. She has been tolerating the dressing changes with the [facility] Dressing without complication and overall this has definitely showed signs of improvement according to records as well is what the patient tells me today. I'm very pleased in that regard. She is having no pain today 03/08/18 She is here for follow up evaluation of [name] left great toe ulcer. She remains non-compliant with glucose control and smoking cessation; glucose levels consistently >200. She states that she got new shoe inserts/peg assist. She admits to compliance with offloading. Since my last evaluation there is significant improvement. We will switch to prisma at this time and she will follow up next week. She is noted to be tachycardic at this appointment, heart rate 120s; she has [name] history of heart rate 70-130 according to our records. She admits to extreme agitation r/t personal issues; she was advised to monitor her heartrate and contact her physician if it does not return to [name] more normal range (<100). She takes cardizem twice daily. 03/15/18-She is here in follow-up evaluation for left great toe ulcer. She remains noncompliant with glucose control and smoking cessation. She admits to compliance with wearing offloading shoe. The ulcer is improved/stable and we will continue with the same treatment plan and she will follow-up next week 03/22/18-She is here for evaluation for left great toe ulcer. There continues to be significant improvement despite recurrent  hyperglycemia (over 500 yesterday) and she continues to smoke. She has been compliant with offloading and we will continue with same treatment plan and she will follow-up next week. 03/29/18-She is here for evaluation for left great toe ulcer. Despite continuing to smoke and uncontrolled diabetes she continues to improve. She is compliant with offloading shoe. We will continue with the same treatment plan and she will follow-up next week 04/05/18- She is here in follow up evaluation for [name] left great toe ulcer; she presents with small pustule to left fifth toe (resembles ant bite). She admits to compliance with wearing offloading shoe; continues to smoke or have uncontrolled blood glucose control. There is more callus than usual with evidence of bleeding; she denies known trauma. 04/12/18-She is here for evaluation of left great toe ulcer. Despite noncompliance with glycemic control and smoking she continues to make improvement. She continues to wear offloading shoe. The pustule, that was identified last week, to the left fifth toe is resolved. She will follow-up in 2 weeks 05/03/18-she is seen in follow-up evaluation for [name] left great toe ulcer. She is compliant with offloading, otherwise noncompliant with glycemic control and smoking. She has plateaued and there is minimal improvement noted. We will transition to [facility], replaced the insert to her surgical shoe and she will follow-up in one week 05/10/18- She is here in follow up evaluation for [name] left great toe ulcer. It appears stable despite measurement change. We will continue with same treatment plan and follow up next week. 05/24/18-She is seen in follow-up evaluation for [name] left great toe ulcer. She remains compliant with offloading, has made significant improvement in  her diet, decreasing the amount of sugar/soda. She said her recent A1c was 10.9 which is lower than. She did see [name] diabetic nutritionist/educator yesterday. She continues to  smoke. We will continue with the same treatment plan and she'll follow-up next week. 05/31/18- She is seen in follow-up evaluation for left great toe ulcer. She continues to remain compliant with offloading, continues to make improvement in her diet, increasing her water and decreasing the amount of sugar/soda. She does continue to smoke with no desire to quit. We will apply Prisma to the depth and Hydrofera Blue over. We have not received insurance authorization for oasis. She will follow up next week. 06/07/18-She is seen in follow-up evaluation for left great toe ulcer. It has stalled according to today's measurements although base appears stable. She says she saw [name] diabetic educator yesterday; her average blood sugars are less than 300 which is an improvement for her. She continues to smoke and states "that's my next step" She continues with water over soda. We will order for xray, culture and reinstate ace wrap compression prior to placing apligraf for next week. She is voicing no complaints or concerns. Her dressing will change to iodoflex over the next week in preparation for apligraf. 06/14/18-She is seen in follow-up evaluation for left great toe ulcer. Plain film x-ray performed last week was negative for osteomyelitis. Wound culture obtained last week grew strep B and OSSA; she is initiated on keflex and cefdinir today; there is erythema to the toe which could be from ace wrap compression, she has [name] history of wrapping too tight and has has been encouraged to maintain ace wraps that we place today. We will hold off on application of apligraf today, will apply next week after antibiotic therapy has been initiated. She admits today that she has resumed taking [name] shower with her foot/toe submerged in water, she has been reminded to keep foot/toe out of the bath water. She will be seen in follow up next week 06/21/18-she is seen in follow-up evaluation for left great toe ulcer. She is tolerating  antibiotic therapy with no GI disturbance. The wound is stable. Apligraf was WANONA, YAP [name] (PT:[id number]) [id number] Page 12 of 20 applied today. She has been decreasing her smoking, only had 4 cigarettes yesterday and 1 today. She continues being more compliant in diabetic diet. She will follow-up next week for evaluation of site, if stable will remove at 2 weeks. 06/28/18- She is here in follow up evalution. Apligraf was placed last week, she states the dressing fell off on Tuesday and she was dressing with hydrofera blue. She is healed and will be discharged from the clinic today. She has been instructed to continue with smoking cessation, continue monitoring glucose levels, offloading for an additional 4 weeks and continue with hydrofera blue for additional two weeks for any possible microscopic opening. Readmission: 08/07/18 on evaluation today patient presents for reevaluation concerning the ulcer of her right great toe. She was previously discharged on 06/28/18 healed. Nonetheless she states that this began to show signs of drainage she subsequently went to her primary care provider. Subsequently an x-ray was performed on 08/01/18 which was negative. The patient was also placed on antibiotics at that time. Fortunately they should have been effective for the infection. Nonetheless she's been experiencing some improvement but still has [name] lot of drainage coming from the wound itself. 08/14/18 on evaluation today patient's wound actually does show signs of improvement in regard to the erythema at this point. She  has completed the antibiotics. With that being said we did discuss the possibility of placing her in [name] total contact cast as of today although I think that I may want to give this just [name] little bit more time to ensure nothing recurrence as far as her infection is concerned. I do not want to put in the cast and risk infection at that time if things are not completely  resolved. With that being said she is gonna require some debridement today. 08/21/18 on evaluation today patient actually appears to be doing okay in regard to her toe ulcer. She's been tolerating the dressing changes without complication. With that being said it does appear that she is ready and in fact I think it's appropriate for [place] to go ahead and initiate the total contact cast today. Nonetheless she will require some sharp debridement to prepare the wound for application. Overall I feel like things have been progressing well but we do need to do something to get this to close more readily. 08/24/18 patient seen today for reevaluation after having had the total contact cast applied on Tuesday. She seems to have done very well the wound appears to be doing great and overall I'm pleased with the progress that she's made. There were no abnormal areas of rubbing from the cast on her lower extremity. 08/30/18 on evaluation today patient actually appears to be completely healed in regard to her plantar toe ulcer. She tells me at this point she's been having [name] lot of issues with the cast. She almost fell [name] couple of times the state shall the step of her dog [name] couple times as well. This is been [name] very frustrating process for her other nonetheless she has completely healed the wound which is excellent news. Overall there does not appear to be the evidence of infection at this time which is great news. 09/11/18 evaluation today patient presents for follow-up concerning her great toe ulcer on the left which has unfortunately reopened since I last saw her which was only [name] couple of weeks ago. Unfortunately she was not able to get in to get the shoe and potentially the AFO that's gonna be necessary due to her left foot drop. She continues with offloading shoe but this is not enough to prevent her from reopening it appears. When we last had her in the total contact cast she did well from [name] healing standpoint but  unfortunately the wound reopened as soon as she came out of the cast within just [name] couple of weeks. 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 This is also part of the reason why she falls on [name] regular basis. I just do not believe that is gonna be safe for [place] to reinitiate the total contact cast as last time we had this on she fell 3 times one week which is definitely not normal for her. 09/18/18 upon evaluation today the patient actually appears to be doing about the same in regard to her toe ulcer. She did not contact [employer] as I asked her to even though I had given her the prescription. In fact she actually states that she has no idea where the prescription is. She did apparently call [employer] and they told her that all she needed to do  was bring the prescription in order to be able to be seen and work on getting the AFO for her left foot. With all that being said she still does not have an appointment and I'm not sure were things stand that regard. I will give her [name] new prescription today in order to contact them to get this set up. 09/25/18 on evaluation today patient actually appears to be doing about the same in regard to her toes ulcer. She does have [name] small areas which seems to have [name] lot of callous buildup around the edge of the wound which is going to need sharp debridement today. She still is waiting to be scheduled for evaluation with [employer] for possibility of an AFO. She states there supposed to call her tomorrow to get this set up. Unfortunately it does appear that her foot specifically the toe area is showing signs of erythema. There does not appear to be any systemic infection which is in these good news. 10/02/18 on evaluation today patient actually appears to be doing about the same in  regard to her toe ulcer. This really has not done too well although it's not significantly larger it's also not significantly smaller. She has been tolerating the dressing changes without complication. She actually has her appointment with [employer] and [place] tomorrow to hopefully be measured for obtaining and AFO splint. I think this would be helpful preventing this from reoccurring. We had contemplated starting the cast this week although to be honest I am reluctant to do that as she's been having nausea, vomiting, and seizure activity over the past three days. She has [name] history of seizures and have been told is nothing that can be done for these. With that being said I do believe that along with the seizures have the nausea vomiting which upon further questioning doesn't seem to be the normal for her and makes me concerned for the possibility of infection or something else going on. I discussed this with the patient and her mother during the office visit today. I do not feel the wound is effective but maybe something else. The responses this was "this just happens to her at times and we don't know why". They did not seem to be interested in going to the hospital to have this checked out further. 10/09/18 on evaluation today patient presents for follow-up concerning her ongoing toe ulcer. She has been tolerating the dressing changes without complication. Fortunately there does not appear to be any evidence of infection which is great news however I do think that the patient would benefit from going ahead for with the total contact cast. She's actually in [name] wheelchair today she tells me that she will use her walker if we initiate the cast. I was very specific about the fact that if we were gonna do the cast I wanted to make sure that she was using the walker in order to prevent any falls. She tells me she does not have stairs that she has to traverse on [name] regular basis at her home. She has not had any  seizures since last week again that something that happens to her often she tells me she did talk to [employer] and they said that it may take up to three weeks to get the brace approved for her. Hopefully that will not take that long but nonetheless in the meantime I do think the cast could be of benefit. 10/12/18 on evaluation today patient appears to be doing rather well in  regard to her toe ulcer. It's just been [name] few days and already this is significantly improved both as far as overall appearance and size. Fortunately there's no sign of infection. She is here for her first obligatory cast change. 10/19/18 Seen today for follow up and management of left great toe ulcer. Wound continues to show improvement. Noted small open area with seroussang drainage with palpation. Denies any increased pain or recent fevers during visit. She will continue calcium alginate with offloading shoe. Denies any questions or concerns during visit. 10/26/18 on evaluation today patient appears to be doing about the same as when I last saw her in regard to her wound bed. Fortunately there does not appear to be any signs of infection. Unfortunately she continues to have [name] breakdown in regard to the toe region any time that she is not in the cast. It takes almost no time at all for this to happen. Nonetheless she still has not heard anything from the brace being made by [employer] as to when exactly this will be available to her. Fortunately there is no signs of infection at this time. 10/30/18 on evaluation today patient presents for application of the total contact cast as we just received him this morning. Fortunately we are gonna be able to apply this to her today which is great news. She continues to have no significant pain which is good news. Overall I do feel like things have been improving while she was the cast is when she doesn't have [name] cast that things get worse. She still has not really heard anything from [place]  regarding her brace. 11/02/18 upon evaluation today patient's wound already appears to be doing significantly better which is good news. Fortunately there does not appear to be any signs of infection also good news. Overall I do think the total contact cast as before is helping to heal this area unfortunately it's just not gonna likely keep the area closed and healed without her getting her brace at least. Again the foot drop is [name] significant issue for her. 11/09/18 on evaluation today patient appears to be doing excellent in regard to her toe ulcer which in fact is completely healed. Fortunately we finally got the situation squared away with the paperwork which was needed to proceed with getting her brace approved by Medicaid. I have filled that out unfortunately that information has been sent to the orthopedic office that I worked at 2 1/2 years ago and not tired Current wound care measures. Fortunately she seems to be [name], [name] ([id number]) [id number] Page 13 of 20 doing very well at this time. 11/23/18 on evaluation today patient appears to be doing More Poorly Compared to Last Time I Saw Her. At [facility] She Had Completely Healed. Currently she is continuing to have issues with reopening. She states that she just found out that the brace was approved through Medicaid now she just has to go get measured in order to have this fitted for her and then made. Subsequently she does not have an appointment for this yet that is going to complicate things we obviously cannot put her back in the cast if we do not have everything measured because they're not gonna be able to measure her foot while she is in the cast. 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 Obviously this is unfortunate and does have me somewhat  worried as well. 11/30/18 on evaluation today patient's toe ulcer  actually appears to be doing fairly well. The good news is she will be getting her brace in the shoes next week on Wednesday. Hopefully we will be able to get this to heal without having to go back in the cast however she may need the cast in order to get the wound completely heal and then go from there. Fortunately there's no signs of infection at this time. 12/07/18 on evaluation today patient fortunately did receive her brace and she states she could tell this definitely makes her walk better. With that being said she's been having issues with her toe where she noticed yesterday there was [name] lot of tissue that was loosing off this appears to be much larger than what it was previous. She also states that her leg has been read putting much across the top of her foot just about the ankle although this seems to be receiving somewhat. The total area is still red and appears to be someone infected as best I can tell. She is previously taken Bactrim and that may be [name] good option for her today as well. We are gonna see what I wound culture shows as well and I think that this is definitely appropriate. With that being said outside of the culture I still need to initiate something in the interim and that's what I'm gonna go ahead and select Bactrim is [name] good option for her. 12/14/18 on evaluation today patient appears to be doing better in regard to her left great toe ulcer as compared to last week's evaluation. There's still some erythema although this is significantly improved which is excellent news. Overall I do believe that she is making good progress is still gonna take some time before she is where I would like her to be from the standpoint of being able to place her back into the total contact cast. Hopefully we will be where we need to be by next week. 12/21/18 on evaluation today patient actually appears to be doing poorly in regard to her toe ulcer. She's been tolerating the dressing changes  without complication. Fortunately there's no signs of systemic infection although she does have [name] lot of drainage from the toe ulcer and this does seem to be causing some issues at this point. She does have erythema on the distal portion of her toe that appears to be likely cellulitis. 12/28/18 on evaluation today patient actually appears to be doing [name] little better in my pinion in regard to her toe ulcer. With that being said she still does have some evidence of infection at this time and for her culture she had both E. coli as well as enterococcus as organisms noted on evaluation. For that reason I think that though the Keflex likely has treated the E. coli rather well this has really done nothing for the enterococcus. We are going to have to initiate treatment for this specifically. 01/04/19 on evaluation today patient's toe actually appears to be doing better from the standpoint of infection. She currently would like to see about putting the cash back on I think that this is appropriate as long as she takes care of it and keeps it from getting wet. She is gonna have some drainage we can definitely pass this up with Drawtex and alginate to try to prevent as much drainage as possible from causing the problems. With that being said I do want to at least try her with the  cast between now and Tuesday. If there any issues we can't continue to use it then I will discontinue the use of the cast at that point. 01/08/19 on evaluation today patient actually appears to be doing very well as far as her foot ulcer specifically the great toe on the left is concerned. She did have an area of rubbing on the medial aspect of her left ankle which again is from the cast. Fortunately there's no signs of infection at this point in this appears to be [name] very slight skin breakdown. The patient tells me she felt it rubbing but didn't think it was that bad. Fortunately there is no signs of active infection at this time which is  good news. No fevers, chills, nausea, or vomiting noted at this time. 01/15/19 on evaluation today patient actually appears to be doing well in regard to her toe ulcer. Again as previous she seems to do well and she has the cast on which indicates to me that during the time she doesn't have [name] cast on she's putting way too much pressure on this region. Obviously I think that's gonna be an issue as with the current national emergency concerning the Covid-19 Virus it has been recommended that we discontinue the use of total contact casting by the chief medical officer of our company, Dr. Simona Huh. The reasoning is that if [name] patient becomes sick and cannot come into have the cast removed they could not just leave this on for an additional two weeks. Obviously the hospitals also do not want to receive patient's who are sick into the emergency department to potentially contaminate the region and spread the Covid-19 Virus among other sick individuals within the hospital system. Therefore at this point we are suspending the use of total contact cast until the current emergency subsides. This was all discussed with the patient today as well. 01/22/19 on evaluation today patient's wound on her left great toe appears to be doing slightly worse than previously noted last week. She tells me that she has been on this quite [name] bit in fact she tells me she's been awake for 38 straight hours. This is due to the fact that she's having to care for grandparents because nobody else will. She has been taking care of them for five the last seven days since I've seen her they both have dementia his is from [name] stroke and her grandmother's was progressive. Nonetheless she states even her mom who knows her condition and situation has only help two of those days to take care of them she's been taking care of the rest. Fortunately there does not appear to be any signs of active infection in regard to her toe at this point  although obviously it doesn't look as good as it did previous. I think this is directly related to her not taking off the pressure and friction by way of taking things easy. Though I completely understand what's going on. 01/29/19 on evaluation today patient's tools are actually appears to be showing some signs of improvement today compared to last week's evaluation as far as not necessarily the overall size of the wound but the fact that she has some new skin growth in between the two ends of the wound opening. Overall I feel like she has done well she states that she had [name] family member give her what sounds to be [name] CAM walker boot which has been helpful as well. 02/05/19 on evaluation today patient's wound bed actually appears  to be doing significantly better in regard to her overall appearance of the size of the wound. With that being said she is still having an issue with offloading efficiently enough to get this to close. Apparently there is some signs of infection at this point as well unfortunately. Previously she's done well of Augmentin I really do not see anything that needs to be culture currently but there theme and cellulitis of the foot that I'm seeing I'm gonna go ahead and place her on an antibiotic today to try to help clear this up. 02/12/2019 on evaluation today patient actually appears to be doing poorly in regard to her overall wound status. She tells me she has been using her offloading shoe but actually comes in today wearing her tennis shoe with the AFO brace. Again as I previously discussed with her this is really not sufficient to allow the area to heal appropriately. Nonetheless she continues to be somewhat noncompliant and I do wonder based on what she has told my nurse in the past as to whether or not [name] good portion of this noncompliance may be recreational drug and alcohol related. She has had [name] history of heroin overdose and this was fairly recently in the past couple of  months that have been seeing her. Nonetheless overall I feel like her wound looks significantly worse today compared to what it was previous. She still has significant erythema despite the Augmentin I am not sure that this is an appropriate medication for her infection I am also concerned that the infection may have gone down into her bone. 02/19/19 on evaluation today patient actually appears to be doing about the same in regard to her toe ulcer. Unfortunately she continues to show signs of bone exposure and infection at this point. There does not appear to be any evidence of worsening of the infection but I'm also not really sure that it's getting significantly better. She is on the Augmentin which should be sufficient for the Staphylococcus aureus infection that she has at this point. With that being said she may need IV antibiotics to more appropriately treat this. We did have [name] discussion today about hyperbaric option therapy. 02/28/19 on evaluation today patient actually appears to be doing much worse in regard to the wound on her left great toe as compared to even my previous evaluation last week. Unfortunately this seems to be training in [name] pretty poor direction. Her toe was actually now starting to angle laterally and I can actually see the entire joint area of the proximal portion of the digit where is the distal portion of the digit again is no longer even in contact with the joint line. Unfortunately there's [name] lot more necrotic tissue around the edge and the toe appears to be showing signs of becoming gangrenous in my pinion. I'm very concerned about were things stand at this point. She did see infectious disease and they are planning to send in [name] prescription for Sivextro for her and apparently this has been approved. With that being said I don't think she should avoid taking this but at the same time I'm not sure that it's gonna be sufficient to save her toe at this point. She tells me that  she still having to care for grandparents which I think is putting quite [name] bit of strain on her foot and specifically the total area and has caused this to break down even to [name] greater degree than would've otherwise been expected. 03/05/19 on evaluation  today patient actually appears to be doing quite well in regard to her toe all things considering. She still has bone exposed but there Eskin, Aleiyah [name] ([id number]) [id number] Page 14 of 20 appears to be much less your thing on overall the appearance of the wound and the toe itself is dramatically improved. She still does have some issues currently obviously with infection she did see vascular as well and there concerned that her blood flow to the toad. For that reason they are setting up for an angiogram next week. 03/14/19 on evaluation today patient appears to be doing very poor in regard to her toe and specifically in regard to the ulceration and the fact that she's starting to notice the toe was leaning even more towards the lateral aspect and the complete joint is visible on the proximal aspect of the joint. Nonetheless she's also noted [name] significant odor and the tip of the toe is turning more dark and necrotic appearing. Overall I think she is getting worse not better as far as this is concerned. For that reason I am recommending at this point that she likely needs to be seen for likely amputation. READMISSION 03/19/2021 This is [name] patient that we cared for in this clinic for [name] prolonged period of time in 2019 and 2020 with [name] left foot and left first toe wound. 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 Luana Shu. In December 2021 she had an ulcer on her right great toe as well as the fourth and fifth toes. 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 She also had an  angiogram at that time and underwent angioplasty of the right anterior tibial artery. In any case she claims that the wound on the right foot is closed I did not look at this today which was probably an oversight although I think that should be done next week. 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 According to Dr. Deborra Medina last review that she was out of the area being cared for by another physician but recently came back to his attention. 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 [name] culture of this area showed E. coli apparently before she came back to see Dr. Luana Shu she was supposed to be receiving antibiotics but she did not really take them. Nor is she offloading this area at all. Finally her last hemoglobin A1c listed in epic was in March 2022 at 14.1 she says things are [name] lot better since then although I am not sure. 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 She had this wound at the time. During this admission she had E. coli sepsis [name] TEE was negative. 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 She is still [name] heavy cigarette smoker. She has [name] history of bipolar disorder seizure disorder ABI in our clinic was 1.05 6/1; left midfoot in the setting of [name] TMA done previously. Round circular wound with [name] "knuckle" of protruding tissue. The problem is that the knuckle was not attached to any of  the surrounding granulation and this probed proximally widely I removed [name] large portion of this tissue. This wound goes with considerable undermining laterally. I do not feel any bone there was no purulence but this  is [name] deep wound. 6/8; in spite of the debridement I did last week. She arrives with [name] wound looking exactly the same. [name] protruding "knuckle" of tissue nonadherent to most of the surrounding tissue. There is considerable depth around this from 6-12 o'clock at 2.7 cm and undermining of 1 cm. This does not look overtly infected and the x- ray I did last week was negative for any osseous abnormalities. We have been using silver collagen 6/15; deep tissue culture I did last week showed moderate staph aureus and moderate Pseudomonas. This will definitely require prolonged antibiotic therapy. The pathology on the protuberant area was negative for malignancy fungus etc. 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 We have been using silver collagen. I am going to be prescribing Levaquin for 2 weeks. Her CT scan of the foot is down for 7/5 6/22; CT scan of the foot on 7 5. She says she has hardware in the left leg from her previous fracture. She is on the Levaquin for the deep tissue culture I did that showed methicillin sensitive staph aureus and Pseudomonas. I gave her [name] 2-week supply and she will have another week. She arrives in clinic today with the same protuberant tissue however this is nonadherent to the tissue surrounding it. I am really at [name] loss to explain this unless there is underlying deep tissue infection 6/29; patient presents for 1 week follow-up. She has been using collagen to the wound bed. She reports taking her antibiotics as prescribed.She has no complaints or issues today. She denies signs of infection. 7/6; patient presents for one week followup. She has been using collagen to the wound bed. She states she is taking Levaquin however at times she is not able to keep it down. She denies signs of infection. 7/13; patient presents for 1 week follow-up. She has been using silver alginate to the wound bed. She still has nausea when  taking her antibiotics. She denies signs of infection. 7/20; patient presents for 1 week follow-up. She has been using silver alginate with gentamicin cream to the wound bed. She denies any issues and has no complaints today. She denies signs of infection. 7/27; patient presents for 1 week follow-up. She continues to use silver alginate with gentamicin cream to the wound bed. She reports starting her antibiotics. She has no issues or complaints. Overall she reports stability to the wound. 8/3; patient presents for 1 week follow-up. She has been using silver alginate with gentamicin cream to the wound bed. She reports completing all antibiotics. She has no issues or complaints today. She denies signs of infection. 8/17; patient presents for 2-week follow-up. He is to use silver alginate to the wound bed. She has no issues or complaints today. She denies signs of infection. She reports her pain has improved in her foot since last clinic visit 8/24; patient presents for 1 week follow-up. She continues to use silver alginate to the wound bed. She has no issues or complaints. She denies signs of infection. Pain is stable. 9/7; patient presents for follow-up. She missed her last week appointment due to feeling ill. She continues to use silver alginate. She has [name] new wound to the right lower extremity that is covered in eschar. She states It  occurred over the past week and has no idea how it started. She currently denies signs of infection. 9/14; patient presents for follow-up. T the left foot wound she has been using gentamicin cream and silver alginate. T the right lower extremity wound she has o o been keeping this covered and has not obtain Santyl. 9/21; patient presents for follow-up. She reports using gentamicin cream and silver alginate to the left foot and Santyl to the right lower extremity wound. She has no issues or complaints today. She denies signs of infection. 9/28; patient presents for  follow-up. She reports [name] new wound to her right heel. She states this occurred [name] few days ago and is progressively gotten worse. She has been trying to clean the area with [name] Q-tip and Santyl. She reports stability in the other 2 wounds. She has been using gentamicin cream and silver alginate to the left foot and Santyl to the right lower extremity wound. 10/12; patient presents for follow-up. She reports improvement to the wound beds. She is seeing vein and vascular to discuss the potential of [name] left BKA. She states they are going to do an arteriogram. She continues to use silver alginate with dressing changes to her wounds. 11/2; patient presents for follow-up. She states she has not been doing dressing changes to the wound beds. She states she is not able to offload the areas. She reports chronic pain to her left foot wound. 11/9; patient presents for follow-up. She came in with only socks on. She states she forgot to put on shoes. It is unclear if she is doing any dressing changes. She currently denies systemic signs of infection. 11/16; patient presents for follow-up. She came again only with socks on. She states she does not wear shoes ever. It is unclear if she does dressing changes. She currently denies systemic signs of infection. 11/23; patient presents for follow-up. She wore her shoes today. It still unclear exactly what dressing she is using for each wound but she did states she obtained Dakin's solution and has been using this to the left foot wound. She currently denies signs of infection. 11/30; patient presents for follow-up. She has no issues or complaints today. She currently denies signs of infection. 12/7; patient presents for follow-up. She has no issues or complaints today. She has been using Hydrofera Blue to the right heel wound and Dakin solution to the left foot wound. Her right anterior leg wound is healed. She currently denies signs of infection. [name], [name]  ([id number]) [id number] Page 15 of 20 12/14; patient presents for follow-up. She has been using Hydrofera Blue to the right heel and Dakin's to the left foot wounds. She has no issues or complaints today. She denies signs of infection. 12/21; patient presents for follow-up. She reports using Hydrofera Blue to the right heel and Dakin's to the left foot wound. She denies signs of infection. 12/28; patient presents for follow-up. She continues to use Dakin's to the left foot wound and Hydrofera Blue to the right heel wound. She denies signs of infection. 1/4; patient presents for follow-up. She has no issues or complaints today. She denies signs of infection. 1/11; patient presents for follow-up. It is unclear if she has been dressing these wounds over the past week. She currently denies signs of infection. 1/18; patient presents for follow-up. She states she has been using Dakin's wet-to-dry dressings to the left foot. She has been using Hydrofera Blue to the right foot foot wound. She states that  the anterior right leg wound has reopened and draining serous fluid. She denies signs of infection. 1/25; patient presents for follow-up. She has no issues or complaints today. 2/1; patient presents for follow-up. She has no issues or complaints today. She denies signs of infection. 2/8; patient presents for follow-up. She has lost her surgical shoes. She did not have [name] dressing to the right heel wound. She currently denies signs of infection. 2/15; patient presents for follow-up. She reports more pain to the right heel today. She denies purulent drainage Or fever/chills 2/22; patient presents for follow-up. She reports taking clindamycin over the past week. She states that she continues to have pain to her right heel. She reports purulent drainage. Readmission 03/16/2022 Ms. Yanick Banderas is [name] 47 year old female with [name] past medical history of type 2 diabetes, osteomyelitis to  her feet, chronic systolic heart failure and bipolar disorder that presents to the clinic for bilateral feet wounds and right lower extremity wound. She was last seen in our clinic on 12/15/2021. At that time she had purulent drainage coming out of her right plantar foot and I recommended she go to the ED. She states she went to [facility] and has been there for the past 3 months. I cannot see the records. She states she had OR debridement and was on several weeks of IV antibiotics while inpatient. Since discharge she has not been taking care of the wound beds. She had nothing on her feet other than socks today. She currently denies signs of infection. 5/31; patient presents for follow-up. She has been using Dakin's wet-to-dry dressings to the wound beds on her feet bilaterally and antibiotic ointment to the right anterior leg wound. She had [name] wound culture done at last clinic visit that showed moderate Pseudomonas aeruginosa sensitive to ciprofloxacin. She currently denies systemic signs of infection. 6/14; patient presents for follow-up. She received Keystone 5 days ago and has been using this on the wound beds. She states that last week she had to go to the hospital because she had increased warmth and erythema to the right foot. She was started on 2 oral antibiotics. She states she has been taking these. She currently denies systemic signs of infection. She has no issues or complaints today. 6/21; patient presents for follow-up. She states she has been using Keystone antibiotics to the wound beds. She has no issues or complaints today. She denies signs of infection. 6/28; patient presents for follow-up. She has been using Keystone antibiotics to the wound beds. She has no issues or complaints today. 7/12; patient presents for follow-up. Has been using Keystone antibiotics to the wound beds with calcium alginate. She has no issues or complaints today. She never followed up with her  orthopedic surgeon who did the OR debridement to the right foot. We discussed the total contact cast for the left foot and patient would like to do this next week. 7/19; patient presents for follow-up. She has been using Keystone antibiotics with calcium alginate to the wound beds. She has no issues or complaints today. Patient is in agreement to do the total contact cast of the left foot today. She knows to return later this week for the obligatory cast change. 05-13-2022 upon evaluation today patient's wound which she has the cast of the left leg actually appears to be doing significantly better. Fortunately I do not see any signs of active infection locally or systemically which is great news and overall I am extremely pleased with where we  stand currently. 7/26; patient presents for follow-up. She has [name] cast in place for the past week. She states it irritated her shin. Other than that she tolerated the cast well. She states she would like [name] break for 1 week from the cast. We have been using Keystone antibiotic and Aquacel to both wound beds. She denies signs of infection. 8/2; patient presents for follow-up. She has been using Keystone and Aquacel to the wound beds. She denies any issues and has no complaints. She is agreeable to have the cast placed today for the left leg. 06-03-2022 upon evaluation today patient appears to be doing well with regard to her wound she saw some signs of improvement which is great news. Fortunately I do not see any evidence of active infection locally or systemically at this time which is great news. No fevers, chills, nausea, vomiting, or diarrhea. 8/16; patient presents for follow-up. She has no issues or complaints today. We have been using Keystone and Aquacel to the wound beds. The left lower extremity is in [name] total contact cast. She is tolerated this well. 8/23; patient presents for follow-up. She has had the total contact cast on the left leg for the past week.  Unfortunately this has rubbed and broken down the skin to the medial foot. She currently denies signs of infection. She has been using Keystone antibiotic to the right plantar foot wound. 8/30; patient presents for follow-up. We have held off on the total contact cast for the left leg for the past week. Her wound on the left foot has improved and the previous surrounding breakdown of skin has epithelialized. She has been using Keystone antibiotic to both wound beds. She has no issues or complaints today. She denies signs of infection. 9/6; patient presents for follow-up. She has ordered her's Keystone antibiotic and this is arriving this week. She has been doing Dakin's wet-to-dry dressings to the wound beds. She denies signs of infection. She is agreeable to the total contact cast today. 9/13; patient presents for follow-up. She states that the cast caused her left leg shin to be sore. She would like to take [name] break from the cast this week. She has been using Keystone antibiotic to the right plantar foot wound. She denies signs of infection. 9/20; patient presents for follow-up. She has been using Keystone antibiotics to the wound beds with calcium alginate to the right foot wound and Hydrofera Blue to the left foot wound. She is agreeable to having the cast placed today. She has been approved for Apligraf and we will order this for next clinic visit. 9/27; patient presents for follow-up. We have been using Keystone antibiotic with Hydrofera Blue to the left foot wound under [name] total contact cast. T the right o foot wound she has been using Keystone antibiotic and calcium alginate. She declines [name] total contact cast today. Apligraf is available for placement and she would like to proceed with this. 07-28-2022 upon evaluation today patient appears to be doing well currently in regard to her wound. She is actually showing signs of significant improvement JAQUELA, SHAKER [name] ([id number])  [id number] Page 16 of 20 which is great news. Fortunately I do not see any evidence of active infection locally nor systemically at this time. She has been seeing Dr. Heber Linden and to be honest has been doing very well with the cast. Subsequently she comes in today with [name] cast on and we did reapply that today as well. She did not really want to she  try to talk me out of that but I explained that if she wanted to heal this is really the right way to go. Patient voiced understanding. In regard to her right foot this is actually [name] lot better compared to the last time I saw her which is also great news. 10/11; patient presents for follow-up. Apligraf and the total contact cast was placed to the left leg at last clinic visit. She states that her right foot wound had burning pain to it with the placement of Apligraf to this area. She has been doing [place] over this area. She denies signs of infection including increased warmth, erythema or purulent drainage. 11/1; 3-week follow-up. The patient fortunately did not have [name] total contact cast or an Apligraf and on the left foot. She has been using Keystone ABD pads and kerlix and her own running shoes She arrives in clinic today with thick callus and [name] very poor surface on the left foot on the right nonviable skin subcutaneous tissue and [name] deep probing hole. 11/15; patient missed her last clinic appointment. She states she has not been dressing the wound beds for the past 2 weeks. She states that at she had [name] new roommate but is now going back to live with her mother. Apparently its been [name] distracting 2 weeks. Patient currently denies signs of infection. 11/22; patient presents for follow-up. She states she has been using Keystone antibiotic and Dakin's wet-to-dry dressings to the wound beds. She is agreeable for cast placement today. We had ordered Apligraf however this has not been received by our facility. 11/29; Patient had [name]  total contact cast placed at last clinic visit and she tolerated this well. We were using silver alginate under the cast. Patient's been using Keystone antibiotic with Aquacel to the right plantar foot wound. She has no issues or complaints today. Apligraf is available for placement today. Patient would like to proceed with this. 12/6; patient presents for follow-up. She had Apligraf placed in standard fashion last clinic visit under the total contact cast to the left lower extremity. She has been using Keystone antibiotic and Aquacel to the right plantar foot wound. She has no issues or complaints today. 12/13; patient presents for follow-up. She has finished 5 Apligraf placements. Was told she would not qualify for more. We have been doing [name] total contact cast to the left lower extremity. She has been using Keystone antibiotic and Aquacel to the right plantar foot wound. She has no issues or complaints today. 12/20; patient presents for follow-up. We have been using Hydrofera Blue with Keystone antibiotic under [name] total contact cast of the left lower extremity. She reports using Keystone antibiotic and silver alginate to the right heel wound. She has no issues or complaints today. 12/27; patient presents with [name] healthy wound on the left midfoot. We have Apligraf to apply that to that more also using [name] total contact cast. On the right we are using Keystone and silver alginate. She is offloading the right heel with [name] surgical shoealthough by her admission she is on her feet quite [name] bit 1/3; patient presents for follow-up. Apligraf was placed to the wound beds last clinic visit. She was placed in [name] total contact cast to the left lower extremity. She declines [name] total contact cast today. She states that her mother is in the hospital and she cannot adequately get around with the cast on. 1/10; patient presents for follow-up. She declined the total contact cast at last clinic visit.  Both wounds have declined  in appearance. She states that she has been on her feet and not offloading the wound beds. She currently denies signs of infection. 1/17; patient presents for follow-up. She had the total contact cast along with Apligraf placed last week to the left lower extremity. She tolerated this well. She has been using Aquacel Ag and Keystone antibiotic to the right heel wound. She currently denies signs of infection. She has no issues or complaints today. 1/24; patient presents for follow-up. We have been using Apligraf to the left foot wound along with [name] total contact cast. She has done well with this. T the right o heel wound she has been using Aquacel Ag and Keystone antibiotic ointment. She has no issues or complaints today. She denies signs of infection. 1/31; patient presents for follow-up. We have been using Apligraf to the left foot wound along with the total contact cast. She continues to do well with this. To the right heel we have been using Aquacel Ag and Keystone antibiotic ointment. She has no issues or complaints today. 12-01-2022 upon evaluation patient is seen today on my schedule due to the fact that she unfortunately was in the hospital yesterday. Her cast needed to come off the only reason she is out of the hospital is due to the fact that they would not take it off at the hospital which is somewhat bewildered reading to me to be perfectly honest. I am not certain why this was but either way she was released and then was placed on my schedule today in order to get this off and reapply the total contact casting as appropriate. I do not have an Apligraf for her today it was applied last week and today's actually expired yesterday as there was some scheduling conflicts with her being in the hospital. Nonetheless we do not have that for reapplication today but the good news that she is not draining too much and the Apligraf can go for up to 2 weeks so I am going to go ahead and reapply the total  contact casting but we are going to leave the Apligraf in place. 2/14; patient presents for follow-up. T the left leg she has had the total contact cast and Apligraf for the past week. She has had no issues with this. T the o o right heel she has been using antibiotic ointment and Aquacel Ag. 2/21; patient presents for follow-up. We have been using Apligraf and [name] total contact cast to the left lower extremity. She is tolerated this well. Unfortunately she is not approved for any more Apligraf per insurance. She has been using antibiotic ointment and Aquacel Ag to the right foot. She has no issues or complaints today. 2/28; patient presents for follow-up. We have been using Hydrofera Blue and antibiotic ointment under the total contact cast to the left lower extremity. He has been using Aquacel Ag with antibiotic ointment to the right plantar foot. She has no issues or complaints today. 3/6; patient presents for follow-up. She did not obtain her gentamicin ointment. She has been using Aquacel Ag to the right plantar foot wound. We have been using Hydrofera Blue with antibiotic ointment under the total contact cast to the left lower extremity. She has no issues or complaints today. 3/12; patient presents for follow-up. She has been using gentamicin ointment and Aquacel Ag to the right plantar foot wound. We have been using Hydrofera Blue with antibiotic ointment under the total contact cast on the left  lower extremity foot wound. She has no issues or complaints today. 3/20; patient presents for follow-up. She has been using gentamicin ointment and Aquacel Ag to the right plantar foot wound. We have been using Hydrofera Blue with antibiotic ointment under the total contact cast to the left lower extremity wound. She has no issues or complaints today. 3/27; patient presents for follow-up. We have been using antibiotic ointment and Aquacel Ag to the right plantar foot wound. We have been using  Hydrofera Blue with antibiotic ointment under the total contact cast to the left lower extremity. Both wounds are smaller. Objective Constitutional [name], [name] ([id number]) [id number] Page 17 of 20 Vitals Time Taken: 11:03 AM, Height: 69 in, Weight: 178 lbs, BMI: 26.3, Temperature: 98.2 F, Pulse: 59 bpm, Respiratory Rate: 16 breaths/min, Blood Pressure: 160/76 mmHg. General Notes: Right foot: T the plantar heel there is an incision site with increased depth, No probing to bone. T the opening there is Nonviable tissue and o o granulation tissue. Left foot: T the medial aspect there is an open wound with granulation tissue, Callus and slough build up. No overt signs of infection to any o of the wound beds. Integumentary (Hair, Skin) Wound #11 status is Open. Original cause of wound was Surgical Injury. The date acquired was: 12/01/2021. The wound has been in treatment 44 weeks. The wound is located on the [place]. The wound measures 0.5cm length x 0.5cm width x 0.3cm depth; 0.196cm^2 area and 0.059cm^3 volume. There is Fat Layer (Subcutaneous Tissue) exposed. There is [name] medium amount of serosanguineous drainage noted. The wound margin is distinct with the outline attached to the wound base. There is medium (34-66%) pale granulation within the wound bed. There is [name] medium (34-66%) amount of necrotic tissue within the wound bed. Wound #12 status is Open. Original cause of wound was Pressure Injury. The date acquired was: 03/16/2020. The wound has been in treatment 44 weeks. The wound is located on the [place]. The wound measures 0.7cm length x 0.4cm width x 0.1cm depth; 0.22cm^2 area and 0.022cm^3 volume. There is Fat Layer (Subcutaneous Tissue) exposed. There is [name] medium amount of serous drainage noted. The wound margin is flat and intact. There is small (1- 33%) red, pink granulation within the wound bed. There is [name] large (67-100%) amount  of necrotic tissue within the wound bed including Adherent Slough. 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 I debrided nonviable tissue. I recommended continuing the course with antibiotic, ointment and Hydrofera Blue under the total contact cast for the left lower extremity and Aquacel Ag with antibiotic ointment to the right plantar foot. Follow-up in 1 week. Procedures Wound #11 Pre-procedure diagnosis of Wound #11 is an Open Surgical Wound located on the Right,Plantar Foot . There was [name] Excisional Skin/Subcutaneous Tissue Debridement with [name] total area of 0.25 sq cm performed by Kalman Shan, MD. With the following instrument(s): Curette to remove Viable and Non-Viable tissue/material. Material removed includes Subcutaneous Tissue and Slough and. [name] time out was conducted at 11:33, prior to the start of the procedure. [name] Minimum amount of bleeding was controlled with  Pressure. The procedure was tolerated well. Post Debridement Measurements: 0.5cm length x 0.5cm width x 0.3cm depth; 0.059cm^3 volume. Character of Wound/Ulcer Post Debridement is stable. Post procedure Diagnosis Wound #11: Same as Pre-Procedure Wound #12 Pre-procedure diagnosis of Wound #12 is [name] Diabetic Wound/Ulcer of the Lower Extremity located on the Left,Medial,Plantar Foot .Severity of Tissue Pre Debridement is: Fat layer exposed. There was [name] Selective/Open Wound Non-Viable Tissue Debridement with [name] total area of 2.25 sq cm performed by Kalman Shan, MD. With the following instrument(s): Curette  to remove Viable and Non-Viable tissue/material. Material removed includes Callus and Slough and. [name] time out was conducted at 11:30, prior to the start of the procedure. [name] Minimum amount of bleeding was controlled with Pressure. The procedure was tolerated well. Post Debridement Measurements: 0.7cm length x 0.4cm width x 0.1cm depth; 0.022cm^3 volume. Character of Wound/Ulcer Post Debridement is stable. Severity of Tissue Post Debridement is: Fat layer exposed. Post procedure Diagnosis Wound #12: Same as Pre-Procedure Pre-procedure diagnosis of Wound #12 is [name] Diabetic Wound/Ulcer of the Lower Extremity located on the Left,Medial,Plantar Foot . There was [name] T Contact otal Cast Procedure by Kalman Shan, MD. Post procedure Diagnosis Wound #12: Same as Pre-Procedure Plan Follow-up Appointments: Return Appointment in 1 week. Nurse Visit as needed Bathing/ Shower/ Hygiene: May shower with wound dressing protected with water repellent cover or cast protector. No tub bath. Anesthetic (Use 'Patient Medications' Section for Anesthetic Order Entry): Lidocaine applied to wound bed Edema Control - Lymphedema / Segmental Compressive Device / Other: Elevate, Exercise Daily and Avoid Standing for Long Periods of Time. [name], [name] ([id number]) [id number] Page 18 of 20 Elevate legs to the level of the heart and pump ankles as often as possible Elevate leg(s) parallel to the floor when sitting. Off-Loading: T Contact Cast to Left Lower Extremity otal Open toe surgical shoe - Heel off loader - Right foot Other: - keep pressure off of feet. Additional Orders / Instructions: Follow Nutritious Diet and Increase Protein Intake Other: - Stay off of foot, wheelchair only WOUND #11: - Foot Wound Laterality: Plantar, Right Cleanser: Wound Cleanser 1 x Per Day/30 Days Discharge Instructions: Wash your hands with soap and water. Remove old dressing, discard into plastic bag  and place into trash. Cleanse the wound with Wound Cleanser prior to applying [name] clean dressing using gauze sponges, not tissues or cotton balls. Do not scrub or use excessive force. Pat dry using gauze sponges, not tissue or cotton balls. Topical: Gentamicin 1 x Per Day/30 Days Discharge Instructions: Apply as directed by provider. in clinic Prim Dressing: Aquacel Extra Hydrofiber Dressing, 2x2 (in/in) 1 x Per Day/30 Days ary Secondary Dressing: ABD Pad 5x9 (in/in) (Generic) 1 x Per Day/30 Days Discharge Instructions: Cover with ABD pad Secured With: Medipore T - 54M Medipore H Soft Cloth Surgical T ape ape, 2x2 (in/yd) 1 x Per Day/30 Days Secured With: [employer] Sterile or Non-Sterile 6-ply 4.5x4 (yd/yd) 1 x Per Day/30 Days Discharge Instructions: Apply Kerlix as directed WOUND #12: - Foot Wound Laterality: Plantar, Left, Medial Cleanser: Wound Cleanser 1 x Per Week/30 Days Discharge Instructions: Wash your hands with soap and water. Remove old dressing, discard into plastic bag and place into trash. Cleanse the wound with Wound Cleanser prior to applying [name] clean dressing using gauze sponges, not tissues or cotton balls. Do not scrub or use excessive force. Pat dry using gauze sponges, not tissue or cotton balls. Topical: Gentamicin 1 x Per Week/30 Days Discharge Instructions: Apply as  directed by provider. in clinic Prim Dressing: Hydrofera Blue Ready Transfer Foam, 2.5x2.5 (in/in) 1 x Per Week/30 Days ary Discharge Instructions: Apply Hydrofera Blue Ready to wound bed as directed Prim Dressing: Silvercel 4 1/4x 4 1/4 (in/in) 1 x Per Week/30 Days ary Discharge Instructions: Apply to periwound Secondary Dressing: ABD Pad 5x9 (in/in) (Generic) 1 x Per Week/30 Days Discharge Instructions: Cover with ABD pad Secured With: Medipore T - 42M Medipore H Soft Cloth Surgical T ape ape, 2x2 (in/yd) 1 x Per Week/30 Days Secured With: [employer] Sterile or Non-Sterile 6-ply 4.5x4 (yd/yd) 1 x Per  Week/30 Days Discharge Instructions: Apply Kerlix as directed 1. In office sharp debridement 2. T contact cast placed in standard fashionooleft lower extremity otal 3. Hydrofera Blue and antibiotic ointment 4. Aquacel Ag and antibiotic ointment 5. Offloading heel shoe to the right foot Electronic Signature(s) Signed: 01/18/2023 11:48:34 AM By: Kalman Shan DO Entered By: Kalman Shan on 01/18/2023 11:44:25 -------------------------------------------------------------------------------- ROS/PFSH Details Patient Name: Date of Service: [name], Emmilee [name]. 01/18/2023 11:00 [name]: [id number] Patient Account Number: [id number] Date of Birth/Sex: Treating RN: 1976-06-10 (47 y.o. [name] Primary Care Provider: Raelene Bott Other Clinician: Massie Kluver Referring Provider: Treating Provider/Extender: Eddie North in Treatment: 52 Information Obtained From Patient Ear/Nose/Mouth/Throat Medical History: Positive for: Chronic sinus problems/congestion; Middle ear problems Hematologic/Lymphatic Medical History: Positive for: Anemia Respiratory Medical History: Positive for: Chronic Obstructive Pulmonary Disease (COPD) Defeo, Kyera [name] ([id number]) [id number] Page 19 of 20 Cardiovascular Medical History: Positive for: Congestive Heart Failure - EF 33% Endocrine Medical History: Positive for: Type II Diabetes Time with diabetes: 14 years Treated with: Insulin, Oral agents Blood sugar tested every day: No Blood sugar testing results: Bedtime: 176 Genitourinary Medical History: Positive for: End Stage Renal Disease Integumentary (Skin) Medical History: Positive for: History of pressure wounds Neurologic Medical History: Positive for: Neuropathy - feel and legs HBO Extended History Items Ear/Nose/Mouth/Throat: Ear/Nose/Mouth/Throat: Chronic sinus Middle ear  problems problems/congestion Immunizations Pneumococcal Vaccine: Received Pneumococcal Vaccination: No Implantable Devices No devices added Family and Social History Current every day smoker; Marital Status - Single; Alcohol Use: Never; Drug Use: Prior History; Caffeine Use: Daily Electronic Signature(s) Signed: 01/18/2023 11:48:34 AM By: Kalman Shan DO Signed: 01/18/2023 2:54:42 PM By: Gretta Cool, BSN, RN, CWS, [name] RN, BSN Entered By: Kalman Shan on 01/18/2023 11:45:23 -------------------------------------------------------------------------------- Total Contact Cast Details Patient Name: Date of Service: [name] 01/18/2023 11:00 [name]: [id number] Patient Account Number: [id number] Date of Birth/Sex: Treating RN: January 05, 1976 (47 y.o. [name], [name] Primary Care Provider: Raelene Bott Other Clinician: Massie Kluver Referring Provider: Treating Provider/Extender: Eddie North in Treatment: 86 T Contact Cast Applied for Wound Assessment: otal Wound #12 Left,Medial,Plantar Foot Performed By: Physician Kalman Shan, MD Post Procedure Diagnosis Same as Pre-procedure Electronic Signature(s) Signed: 01/18/2023 11:48:34 AM By: Kalman Shan DO Signed: 01/19/2023 9:18:57 AM By: Massie Kluver Entered By: Massie Kluver on 01/18/2023 11:32:51 [name] ([id number]) [id number] Page 20 of 20 -------------------------------------------------------------------------------- SuperBill Details Patient Name: Date of Service: VENNIE, [name] 01/18/2023 Medical Record Number: [id number] Patient Account Number: [id number] Date of Birth/Sex: Treating RN: [birth year] (47 y.o. [name] Primary Care Provider: Raelene Bott Other Clinician: Massie Kluver Referring Provider: Treating Provider/Extender: Eddie North in Treatment: 44 Diagnosis Coding ICD-10 Codes Code  Description [phone] Non-pressure chronic ulcer of other part of left foot with other specified severity L97.512 Non-pressure chronic ulcer of  other part of right foot with fat layer exposed E11.621 Type 2 diabetes mellitus with foot ulcer E11.42 Type 2 diabetes mellitus with diabetic polyneuropathy L97.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 of skin Facility Procedures : CPT4 Code: [id number] Description: [id number] - DEB SUBQ TISSUE 20 SQ CM/< ICD-10 Diagnosis Description 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.621 Type 2 diabetes mellitus with foot ulcer Modifier: Quantity: 1 : CPT4 Code: [id number] Description: [id number] - DEBRIDE WOUND 1ST 20 SQ CM OR < ICD-10 Diagnosis Description 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i E11.621 Type 2 diabetes mellitus with foot ulcer Modifier: ty Quantity: 1 Physician Procedures : CPT4 Code Description Modifier [id number] - WC PHYS SUBQ TISS 20 SQ CM ICD-10 Diagnosis Description 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.621 Type 2 diabetes mellitus with foot ulcer Quantity: 1 : [id number] - WC PHYS DEBR WO ANESTH 20 SQ CM ICD-10 Diagnosis Description 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ity E11.621 Type 2 diabetes mellitus with foot ulcer Quantity: 1 Electronic Signature(s) Signed: 01/18/2023 11:48:34 AM By: Kalman Shan DO Entered By: Kalman Shan on 01/18/2023 11:44:52

## 2023-01-19 NOTE — Progress Notes (Signed)
[name] ([id number]) [id number] Page 1 of 9 Visit Report for 01/18/2023 Arrival Information Details Patient Name: Date of Service: [name], [name] 01/18/2023 11:00 [name]: [id number] Patient Account Number: [id number] Date of Birth/Sex: Treating RN: 12-20-1975 (47 y.o. [name] Primary Care Maryiah Olvey: Raelene Bott Other Clinician: Massie Kluver Referring Leslea Vowles: Treating Kaytlynne Neace/Extender: Eddie North in Treatment: 26 Visit Information History Since Last Visit All ordered tests and consults were completed: No Patient Arrived: Wheel Chair Added or deleted any medications: No Arrival Time: 10:59 Any new allergies or adverse reactions: No Transfer Assistance: None Had [name] fall or experienced change in No Patient Identification Verified: Yes activities of daily living that may affect Secondary Verification Process Completed: Yes risk of falls: Patient Requires Transmission-Based Precautions: No Signs or symptoms of abuse/neglect since last visito No Patient Has Alerts: No Hospitalized since last visit: No Implantable device outside of the clinic excluding No cellular tissue based products placed in the center since last visit: Has Dressing in Place as Prescribed: Yes Has Footwear/Offloading in Place as Prescribed: Yes Left: T Contact Cast otal Right: Wedge Shoe Pain Present Now: No Electronic Signature(s) Signed: 01/19/2023 9:18:57 AM By: Massie Kluver Entered By: Massie Kluver on 01/18/2023 11:03:15 -------------------------------------------------------------------------------- Clinic Level of Care Assessment Details Patient Name: Date of Service: [name] 01/18/2023 11:00 [name]: [id number] Patient Account Number: [id number] Date of Birth/Sex: Treating RN: 11/27/1975 (47 y.o. [name] Primary Care Yvaine Jankowiak: Raelene Bott Other Clinician: Massie Kluver Referring Zyara Riling: Treating Tajee Savant/Extender: Eddie North in Treatment: 22 Clinic Level of Care Assessment Items TOOL 1 Quantity Score []  - 0 Use when EandM and Procedure is performed on INITIAL visit ASSESSMENTS - Nursing Assessment / Reassessment []  - 0 General Physical Exam (combine w/ comprehensive assessment (listed just below) when performed on new pt. evals) []  - 0 Comprehensive Assessment (HX, ROS, Risk Assessments, Wounds Hx, etc.) ASSESSMENTS - Wound and Skin Assessment / Reassessment []  - 0 Dermatologic / Skin Assessment (not related to wound area) ASSESSMENTS - Ostomy and/or Continence Assessment and Care []  - 0 Incontinence Assessment and Management []  - 0 Ostomy Care Assessment and Management (repouching, etc.) PROCESS - Coordination of Care []  - 0 Simple Patient / Family Education for ongoing care []  - 0 Complex (extensive) Patient / Family Education for ongoing care []  - 0 Staff obtains Programmer, systems, Records, T Results / Process Orders est []  - 0 Staff telephones HHA, Nursing Homes / Clarify orders / etc []  - 0 Routine Transfer to another Facility (non-emergent condition) [name], Malee [name] ([id number]) [phone].pdf Page 2 of 9 []  - 0 Routine Hospital Admission (non-emergent condition) []  - 0 New Admissions / Biomedical engineer / Ordering NPWT Apligraf, etc. , []  - 0 Emergency Hospital Admission (emergent condition) PROCESS - Special Needs []  - 0 Pediatric / Minor Patient Management []  - 0 Isolation Patient Management []  - 0 Hearing / Language / Visual special needs []  - 0 Assessment of Community assistance (transportation, D/C planning, etc.) []  - 0 Additional assistance / Altered mentation []  - 0 Support Surface(s) Assessment (bed, cushion, seat, etc.) INTERVENTIONS - Miscellaneous []  - 0 External ear exam []  - 0 Patient Transfer (multiple staff / Civil Service fast streamer / Similar devices) []  -  0 Simple Staple / Suture removal (25 or less) []  - 0 Complex Staple / Suture removal (26 or more) []  - 0 Hypo/Hyperglycemic Management (do not check if billed separately) []  -  0 Ankle / Brachial Index (ABI) - do not check if billed separately Has the patient been seen at the hospital within the last three years: Yes Total Score: 0 Level Of Care: ____ Electronic Signature(s) Signed: 01/19/2023 9:18:57 AM By: Massie Kluver Entered By: Massie Kluver on 01/18/2023 11:34:20 -------------------------------------------------------------------------------- Encounter Discharge Information Details Patient Name: Date of Service: Heather Hitch [name]. 01/18/2023 11:00 [name]: [id number] Patient Account Number: [id number] Date of Birth/Sex: Treating RN: Oct 20, 1976 (47 y.o. [name] Primary Care Reda Gettis: Raelene Bott Other Clinician: Massie Kluver Referring Kiyoto Slomski: Treating Tareka Jhaveri/Extender: Eddie North in Treatment: 38 Encounter Discharge Information Items Post Procedure Vitals Discharge Condition: Stable Temperature (F): 98.2 Ambulatory Status: Wheelchair Pulse (bpm): 59 Discharge Destination: Home Respiratory Rate (breaths/min): 16 Transportation: Private Auto Blood Pressure (mmHg): 160/76 Accompanied By: mother Schedule Follow-up Appointment: Yes Clinical Summary of Care: Electronic Signature(s) Signed: 01/19/2023 9:18:57 AM By: Massie Kluver Entered By: Massie Kluver on 01/18/2023 13:24:26 -------------------------------------------------------------------------------- Lower Extremity Assessment Details Patient Name: Date of Service: [name] 01/18/2023 11:00 [name]: [id number] Patient Account Number: [id number] Date of Birth/Sex: Treating RN: Jan 18, 1976 (47 y.o. [name] Primary Care Amery Minasyan: Raelene Bott Other Clinician: Massie Kluver Referring Enas Winchel: Treating Boleslaus Holloway/Extender:  Eddie North in Treatment: [address], [place] ([id number]) [id number] Page 3 of 9 Electronic Signature(s) Signed: 01/18/2023 2:54:42 PM By: Gretta Cool, BSN, RN, CWS, [name] RN, BSN Signed: 01/19/2023 9:18:57 AM By: Massie Kluver Entered By: Massie Kluver on 01/18/2023 11:22:49 -------------------------------------------------------------------------------- Multi Wound Chart Details Patient Name: Date of Service: Heather Hitch [name]. 01/18/2023 11:00 [name]: [id number] Patient Account Number: [id number] Date of Birth/Sex: Treating RN: [birth year] (47 y.o. 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: Extremity Chronic sinus problems/congestion, Chronic sinus problems/congestion, N/[name] Comorbid History: Middle ear problems, Anemia, Chronic Middle ear problems, Anemia, Chronic Obstructive Pulmonary Disease Obstructive Pulmonary Disease (COPD), Congestive Heart Failure, (COPD), Congestive Heart Failure, Type II Diabetes, End Stage Renal Type II Diabetes, End Stage Renal Disease, History of pressure wounds, Disease, History of pressure wounds, Neuropathy Neuropathy 12/01/2021 03/16/2020 N/[name] Date Acquired: 31 18 N/[name] Weeks of Treatment: Open Open N/[name] Wound Status: No No N/[name] Wound Recurrence: 0.5x0.5x0.3 0.7x0.4x0.1 N/[name] Measurements L x W x D (cm) 0.196 0.22 N/[name] [name] (cm) : rea 0.059 0.022  N/[name] Volume (cm) : 89.40% 95.30% N/[name] % Reduction in Area: 96.40% 97.70% N/[name] % Reduction in Volume: Full Thickness Without Exposed Grade 3 N/[name] Classification: Support Structures Medium Medium N/[name] Exudate Amount: Serosanguineous Serous N/[name] Exudate Type: red, brown amber N/[name] Exudate Color: Distinct, outline attached Flat and Intact N/[name] Wound Margin: Medium (34-66%) Small (1-33%) N/[name] Granulation Amount: Pale Red, Pink N/[name] Granulation Quality: Medium (34-66%) Large (67-100%) N/[name] Necrotic Amount: Fat Layer (Subcutaneous Tissue): Yes Fat Layer (Subcutaneous Tissue): Yes N/[name] Exposed Structures: Fascia: No Fascia: No Tendon: No Tendon: No Muscle: No Muscle: No Joint: No Joint: No Bone: No Bone: No Medium (34-66%) None N/[name] Epithelialization: Treatment Notes Electronic Signature(s) Signed: 01/19/2023 9:18:57 AM By: Yvonna Alanis, Cashay [name] ([id number] Page 4  of 9 Signed: 01/19/2023 9:18:57 AM By: Massie Kluver Entered By: Massie Kluver on 01/18/2023 11:22:54 -------------------------------------------------------------------------------- [place] Details Patient Name: Date of Service: Heather Hitch [name]. 01/18/2023 11:00 [name]: [id number] Patient Account Number: [id number] Date of Birth/Sex: Treating RN: 01-03-1976 (47 y.o. [name], [name] Primary Care Mckinzee Spirito: Raelene Bott Other Clinician: Massie Kluver Referring Nyashia Raney: Treating Sanita Estrada/Extender: Eddie North in Treatment: 15 Active Inactive Abuse / Safety / Falls / Self Care Management Nursing Diagnoses: History of Falls Potential for falls Potential for injury related to falls Goals: Patient will not develop complications from immobility Date Initiated: 03/17/2022 Date Inactivated: 04/20/2022 Target Resolution Date: 03/16/2022 Goal Status: Met Patient/caregiver will verbalize understanding of skin  care regimen Date Initiated: 03/17/2022 Date Inactivated: 09/28/2022 Target Resolution Date: 03/16/2022 Goal Status: Met Patient/caregiver will verbalize/demonstrate measure taken to improve self care Date Initiated: 03/17/2022 Target Resolution Date: 03/16/2022 Goal Status: Active Interventions: Assess fall risk on admission and as needed Provide education on basic hygiene Provide education on personal and home safety Notes: Necrotic Tissue Nursing Diagnoses: Impaired tissue integrity related to necrotic/devitalized tissue Knowledge deficit related to management of necrotic/devitalized tissue Goals: Necrotic/devitalized tissue will be minimized in the wound bed Date Initiated: 03/17/2022 Target Resolution Date: 10/26/2022 Goal Status: Active Patient/caregiver will verbalize understanding of reason and process for debridement of necrotic tissue Date Initiated: 03/17/2022 Date Inactivated: 09/28/2022 Target Resolution Date: 03/16/2022 Goal Status: Met Interventions: Provide education on necrotic tissue and debridement process Treatment Activities: Apply topical anesthetic as ordered : 03/16/2022 Excisional debridement : 03/16/2022 Notes: Osteomyelitis Nursing Diagnoses: Infection: osteomyelitis Knowledge deficit related to disease process and management Goals: Patient/caregiver will verbalize understanding of disease process and disease management Date Initiated: 03/17/2022 Target Resolution Date: 03/16/2022 Goal Status: Active WALLACE, DIETSCH [name] ([id number]) [phone].pdf Page 5 of 9 Patient's osteomyelitis will resolve Date Initiated: 03/17/2022 Target Resolution Date: 10/26/2022 Goal Status: Active Signs and symptoms for osteomyelitis will be recognized and promptly addressed Date Initiated: 03/17/2022 Target Resolution Date: 10/26/2022 Goal Status: Active Interventions: Assess for signs and symptoms of osteomyelitis resolution every visit Provide education on  osteomyelitis Treatment Activities: Systemic antibiotics : 03/16/2022 Notes: Patient on antibiotics. Pressure Nursing Diagnoses: Knowledge deficit related to management of pressures ulcers Potential for impaired tissue integrity related to pressure, friction, moisture, and shear Goals: Patient will remain free from development of additional pressure ulcers Date Initiated: 11/02/2022 Target Resolution Date: 11/09/2022 Goal Status: Active Patient/caregiver will verbalize risk factors for pressure ulcer development Date Initiated: 11/02/2022 Target Resolution Date: 11/02/2022 Goal Status: Active Patient/caregiver will verbalize understanding of pressure ulcer management Date Initiated: 11/02/2022 Target Resolution Date: 11/02/2022 Goal Status: Active Interventions: Assess: immobility, friction, shearing, incontinence upon admission and as needed Provide education on pressure ulcers Notes: Electronic Signature(s) Signed: 01/18/2023 2:54:42 PM By: Gretta Cool, BSN, RN, CWS, [name] RN, BSN Signed: 01/19/2023 9:18:57 AM By: Massie Kluver Entered By: Massie Kluver on 01/18/2023 11:34:34 -------------------------------------------------------------------------------- Pain Assessment Details Patient Name: Date of Service: Heather Hitch [name]. 01/18/2023 11:00 [name]: [id number] Patient Account Number: [id number] Date of Birth/Sex: Treating RN: [birth year] (47 y.o. [name] Primary Care Tuck Dulworth: Raelene Bott Other Clinician: Massie Kluver Referring Matilda Fleig: Treating Rebekah Sprinkle/Extender: Eddie North in Treatment: 44 Active Problems Location of Pain Severity and Description of Pain Patient Has Paino No Site Locations [place], [place] [name] ([id number]) [id number] Page 6 of 9 Pain Management and Medication Current Pain Management: Electronic Signature(s) Signed: 01/18/2023 2:54:42 PM By: Gretta Cool, BSN, RN,  CWS, Romie Minus, BSN Signed:  01/19/2023 9:18:57 AM By: Massie Kluver Entered By: Massie Kluver on 01/18/2023 11:18:09 -------------------------------------------------------------------------------- Patient/Caregiver Education Details Patient Name: Date of Service: [name], Amarilys [name]. 3/27/2024andnbsp11:00 [name]: [id number] Patient Account Number: [id number] Date of Birth/Gender: Treating RN: 1976-03-07 (47 y.o. [name] Primary Care Physician: Raelene Bott Other Clinician: Massie Kluver Referring Physician: Treating Physician/Extender: Eddie North in Treatment: 29 Education Assessment Education Provided To: Patient and Caregiver Education Topics Provided Wound/Skin Impairment: Handouts: Other: continue wound care as directed Methods: Explain/Verbal Responses: State content correctly Electronic Signature(s) Signed: 01/19/2023 9:18:57 AM By: Massie Kluver Entered By: Massie Kluver on 01/18/2023 11:34:53 -------------------------------------------------------------------------------- Wound Assessment Details Patient Name: Date of Service: MARYCRUZ, NADOLNY 01/18/2023 11:00 [name]: [id number] Patient Account Number: [id number] Date of Birth/Sex: Treating RN: November 01, 1975 (47 y.o. [name] Primary Care Kimiyah Blick: Raelene Bott Other Clinician: Massie Kluver Referring Shakeia Krus: Treating Ely Spragg/Extender: Linna Hoff Weeks in Treatment: 44 Wound Status Wound Number: 11 Primary Open Surgical Wound Etiology: Wound Location: Right, Plantar Foot Wound Open Wounding Event: Surgical Injury Status: Date Acquired: 12/01/2021 LAKEILA, GRUNDEN [name] ([id number]) [id number].pdf Page 7 of 9 Date Acquired: 12/01/2021 Comorbid Chronic sinus problems/congestion, Middle ear problems, Anemia, Weeks Of Treatment: 44 History: Chronic Obstructive Pulmonary Disease (COPD), Congestive Heart Clustered Wound: No  Failure, Type II Diabetes, End Stage Renal Disease, History of pressure wounds, Neuropathy Photos Wound Measurements Length: (cm) 0.5 Width: (cm) 0.5 Depth: (cm) 0.3 Area: (cm) 0.196 Volume: (cm) 0.059 % Reduction in Area: 89.4% % Reduction in Volume: 96.4% Epithelialization: Medium (34-66%) Wound Description Classification: Full Thickness Without Exposed Suppo Wound Margin: Distinct, outline attached Exudate Amount: Medium Exudate Type: Serosanguineous Exudate Color: red, brown rt Structures Foul Odor After Cleansing: No Slough/Fibrino Yes Wound Bed Granulation Amount: Medium (34-66%) Exposed Structure Granulation Quality: Pale Fascia Exposed: No Necrotic Amount: Medium (34-66%) Fat Layer (Subcutaneous Tissue) Exposed: Yes Tendon Exposed: No Muscle Exposed: No Joint Exposed: No Bone Exposed: No Treatment Notes Wound #11 (Foot) Wound Laterality: Plantar, Right Cleanser Wound Cleanser Discharge Instruction: Wash your hands with soap and water. Remove old dressing, discard into plastic bag and place into trash. Cleanse the wound with Wound Cleanser prior to applying [name] clean dressing using gauze sponges, not tissues or cotton balls. Do not scrub or use excessive force. Pat dry using gauze sponges, not tissue or cotton balls. Peri-Wound Care Topical Gentamicin Discharge Instruction: Apply as directed by Charman Blasco. in clinic Primary Dressing Aquacel Extra Hydrofiber Dressing, 2x2 (in/in) Secondary Dressing ABD Pad 5x9 (in/in) Discharge Instruction: Cover with ABD pad Secured With [place] H Soft Cloth Surgical T ape ape, 2x2 (in/yd) Kerlix Roll Sterile or Non-Sterile 6-ply 4.5x4 (yd/yd) Discharge Instruction: Apply Kerlix as directed Compression Wrap Compression Stockings Add-Ons EMILIYA, KREEGER [name] ([id number]) [phone] Page 8 of 9 Electronic Signature(s) Signed: 01/18/2023 2:54:42 PM By: Gretta Cool, BSN, RN, CWS, [name] RN,  BSN Signed: 01/19/2023 9:18:57 AM By: Massie Kluver Entered By: Massie Kluver on 01/18/2023 11:22:04 -------------------------------------------------------------------------------- Wound Assessment Details Patient Name: Date of Service: Heather Hitch [name]. 01/18/2023 11:00 [name]: [id number] Patient Account Number: [id number] Date of Birth/Sex: Treating RN: [birth year] (47 y.o. [name] Primary Care Vickie Ponds: Raelene Bott Other Clinician: Massie Kluver Referring Melessa Cowell: Treating Jquan Egelston/Extender: Eddie North in Treatment: 44 Wound Status Wound Number: 12 Primary Diabetic Wound/Ulcer of the Lower Extremity Etiology: Wound Location: Left, Medial, Plantar Foot Wound Open  Wounding Event: Pressure Injury Status: Date Acquired: 03/16/2020 Comorbid Chronic sinus problems/congestion, Middle ear problems, Anemia, Weeks Of Treatment: 44 History: Chronic Obstructive Pulmonary Disease (COPD), Congestive Heart Clustered Wound: No Failure, Type II Diabetes, End Stage Renal Disease, History of pressure wounds, Neuropathy Photos Wound Measurements Length: (cm) 0.7 Width: (cm) 0.4 Depth: (cm) 0.1 Area: (cm) 0.22 Volume: (cm) 0.022 % Reduction in Area: 95.3% % Reduction in Volume: 97.7% Epithelialization: None Wound Description Classification: Grade 3 Wound Margin: Flat and Intact Exudate Amount: Medium Exudate Type: Serous Exudate Color: amber Foul Odor After Cleansing: No Slough/Fibrino No Wound Bed Granulation Amount: Small (1-33%) Exposed Structure Granulation Quality: Red, Pink Fascia Exposed: No Necrotic Amount: Large (67-100%) Fat Layer (Subcutaneous Tissue) Exposed: Yes Necrotic Quality: Adherent Slough Tendon Exposed: No Muscle Exposed: No Joint Exposed: No Bone Exposed: No Treatment Notes Wound #12 (Foot) Wound Laterality: Plantar, Left, Medial Cleanser Wound Cleanser Discharge Instruction: Wash your hands  with soap and water. Remove old dressing, discard into plastic bag and place into trash. Cleanse the wound with Wound Cleanser prior to applying [name] clean dressing using gauze sponges, not tissues or cotton balls. Do not scrub or use excessive force. Pat dry using gauze sponges, not tissue or cotton balls. WANDALEE, SPADEA [name] ([id number]) [id number] Page 9 of 9 Peri-Wound Care Topical Gentamicin Discharge Instruction: Apply as directed by Khristie Sak. in clinic Primary Dressing Hydrofera Blue Ready Transfer Foam, 2.5x2.5 (in/in) Discharge Instruction: Apply Hydrofera Blue Ready to wound bed as directed [place] 4 1/4x 4 1/4 (in/in) Discharge Instruction: Apply to periwound Secondary Dressing ABD Pad 5x9 (in/in) Discharge Instruction: Cover with ABD pad Secured With [place] Surgical T ape ape, 2x2 (in/yd) Kerlix Roll Sterile or Non-Sterile 6-ply 4.5x4 (yd/yd) Discharge Instruction: Apply Kerlix as directed Compression Wrap Compression Stockings Add-Ons Electronic Signature(s) Signed: 01/18/2023 2:54:42 PM By: Gretta Cool, BSN, RN, CWS, [name] RN, BSN Signed: 01/19/2023 9:18:57 AM By: Massie Kluver Entered By: Massie Kluver on 01/18/2023 11:22:38 -------------------------------------------------------------------------------- [place] Details Patient Name: Date of Service: [name], Iracema [name]. 01/18/2023 11:00 [name]: [id number] Patient Account Number: [id number] Date of Birth/Sex: Treating RN: 25-May-1976 (47 y.o. [name], [name] Primary Care Theo Reither: Raelene Bott Other Clinician: Massie Kluver Referring Ran Tullis: Treating Blessyn [place]/Extender: Eddie North in Treatment: 48 Vital Signs Time Taken: 11:03 Temperature (F): 98.2 Height (in): 69 Pulse (bpm): 59 Weight (lbs): 178 Respiratory Rate (breaths/min): 16 Body Mass Index (BMI): 26.3 Blood Pressure (mmHg): 160/76 Reference Range: 80 - 120  mg / dl Electronic Signature(s) Signed: 01/19/2023 9:18:57 AM By: Massie Kluver Entered By: Massie Kluver on 01/18/2023 11:07:22

## 2023-01-24 ENCOUNTER — Encounter: Payer: Medicaid Other | Attending: Physician Assistant | Admitting: Physician Assistant

## 2023-01-24 DIAGNOSIS — I429 Cardiomyopathy, unspecified: Secondary | ICD-10-CM | POA: Insufficient documentation

## 2023-01-24 DIAGNOSIS — I5022 Chronic systolic (congestive) heart failure: Secondary | ICD-10-CM | POA: Insufficient documentation

## 2023-01-24 DIAGNOSIS — J449 Chronic obstructive pulmonary disease, unspecified: Secondary | ICD-10-CM | POA: Insufficient documentation

## 2023-01-24 DIAGNOSIS — L97512 Non-pressure chronic ulcer of other part of right foot with fat layer exposed: Secondary | ICD-10-CM | POA: Diagnosis not present

## 2023-01-24 DIAGNOSIS — F319 Bipolar disorder, unspecified: Secondary | ICD-10-CM | POA: Insufficient documentation

## 2023-01-24 DIAGNOSIS — M21372 Foot drop, left foot: Secondary | ICD-10-CM | POA: Insufficient documentation

## 2023-01-24 DIAGNOSIS — E1122 Type 2 diabetes mellitus with diabetic chronic kidney disease: Secondary | ICD-10-CM | POA: Diagnosis not present

## 2023-01-24 DIAGNOSIS — I132 Hypertensive heart and chronic kidney disease with heart failure and with stage 5 chronic kidney disease, or end stage renal disease: Secondary | ICD-10-CM | POA: Insufficient documentation

## 2023-01-24 DIAGNOSIS — G40909 Epilepsy, unspecified, not intractable, without status epilepticus: Secondary | ICD-10-CM | POA: Insufficient documentation

## 2023-01-24 DIAGNOSIS — E1142 Type 2 diabetes mellitus with diabetic polyneuropathy: Secondary | ICD-10-CM | POA: Insufficient documentation

## 2023-01-24 DIAGNOSIS — L97528 Non-pressure chronic ulcer of other part of left foot with other specified severity: Secondary | ICD-10-CM | POA: Diagnosis not present

## 2023-01-24 DIAGNOSIS — N186 End stage renal disease: Secondary | ICD-10-CM | POA: Diagnosis not present

## 2023-01-24 DIAGNOSIS — L97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 of skin: Secondary | ICD-10-CM | POA: Diagnosis not present

## 2023-01-24 DIAGNOSIS — E11621 Type 2 diabetes mellitus with foot ulcer: Secondary | ICD-10-CM | POA: Insufficient documentation

## 2023-01-24 NOTE — Progress Notes (Addendum)
[name], [name] ([id number]) [id number] Page 1 of 18 Visit Report for 01/24/2023 Chief Complaint Document Details Patient Name: Date of Service: [name], [name] 01/24/2023 1:45 PM Medical Record Number: [id number] Patient Account Number: [id number] Date of Birth/Sex: Treating RN: 1976-07-25 (47 y.o. [name] Primary Care Provider: Lindwood [name] Other Clinician: Betha [name] Referring Provider: Treating Provider/Extender: [name] in Treatment: 42 Information Obtained from: Patient Chief Complaint 03/19/2021; patient referred by Dr. Excell Seltzer who has been looking after her left foot for quite [name] period of time for review of [name] nonhealing area in the left midfoot 03/12/2022; bilateral feet wounds and right lower extremity wound. Electronic Signature(s) Signed: 01/24/2023 2:09:58 PM By: Heather Kelp PA-C Entered By: [name] on 01/24/2023 14:09:58 -------------------------------------------------------------------------------- HPI Details Patient Name: Date of Service: [name] 01/24/2023 1:45 PM Medical Record Number: [id number] Patient Account Number: [id number] Date of Birth/Sex: Treating RN: [birth year] (47 y.o. [name] Primary Care Provider: Lindwood [name] Other Clinician: Betha [name] Referring Provider: Treating Provider/Extender: [name] in Treatment: 75 History of Present Illness HPI Description: 01/18/18-She is here for initial evaluation of the left great toe ulcer. She is [name] poor historian in regards to timeframe in detail. She states approximately 4 weeks ago she lacerated her toe on something in the house. She followed up with her primary care who placed her on Bactrim and ultimately [name] second dose of Bactrim prior to coming to wound clinic. She states she has been treating the toe with peroxide, Betadine and [name] Band-Aid. She did not check her blood sugar this morning but  checked it yesterday morning it was 327; she is unaware of [name] recent A1c and there are no current records. She saw Dr. she would've orthopedics last week for an old injury to the left ankle, she states he did not see her toe, nor did she bring it to his attention. She smokes approximately 1 pack cigarettes [name] day. Her social situation is concerning, she arrives this morning with her mother who appears extremely intoxicated/under the influence; her mother was asked to leave the room and be monitored by the patient's grandmother. The patient's aunt then accompanied the patient and the room throughout the rest of the appointment. We had [name] lengthy discussion regarding the deleterious effects of uncontrolled hyperglycemia and smoking as it relates to wound healing and overall health. She was strongly encouraged to decrease her smoking and get her diabetes under better control. She states she is currently on [name] diet and has cut down her [facility] consumption. The left toe is erythematous, macerated and slightly edematous with malodor present. The edema in her left foot is below her baseline, there is no erythema streaking. We will treat her with Santyl, doxycycline; we have ordered and xray, culture and provided [name] Peg assist surgical shoe and cultured the wound. 01/25/18-She is here in follow-up evaluation for [name] left great toe ulcer and presents with an abscess to her suprapubic area. She states her blood sugars remain elevated, feeling "sick" and if levels are below 250, but she is trying. She has made no attempt to decrease her smoking stating that we "can't take away her food in her cigarettes". She has been compliant with offloading using the PEG assist you. She is using Santyl daily. the culture obtained last week grew staph aureus and Enterococcus faecalis; continues on the doxycycline and Augmentin was added on Monday. The suprapubic area has erythema, no femoral  variation, purple discoloration, minimal  induration, was accessed with [name] cotton tip applicator with sanguinopurulent drainage, this was cultured, I suspect the current antibiotic treatment will cover and we will not add anything to her current treatment plan. She was advised to go to urgent care or ER with any change in redness, induration or fever. 02/01/18-She is here in follow-up evaluation for left great toe ulcers and [name] new abdominal abscess from last week. She was able to use packing until earlier this week, where she "forgot it was there". She states she was feeling ill with GI symptoms last week and was not taking her antibiotic. She states her glucose levels have been predominantly less than 200, with occasional levels between 200-250. She thinks this was contributing to her GI symptoms as they have resolved without intervention. There continues to be significant laceration to left toe, otherwise it clinically looks stable/improved. There is now less superficial opening to the lateral aspect of the great toe that was residual blister. We will transition to [facility] to all wounds, she will continue her Augmentin. If there is no change or deterioration next week for reculture. 02/08/18-She is here in follow-up evaluation for left great toe ulcer and abdominal ulcer. There is an improvement in both wounds. She has been wrapping her left toe with coban, not by our direction, which has created an area of discoloration to the medial aspect; she has been advised to NOT use coban secondary to her neuropathy. She states her glucose levels have been high over this last week ranging from 200-350, she continues to smoke. She admits to being less compliant with her offloading shoe. We will continue with same treatment plan and she will follow-up next week. 02/15/18-She is here in follow-up evaluation for left great toe ulcer and abdominal ulcer. The abdominal ulcer is epithelialized. The left great toe ulcer is improved and all injury from  last week using the Coban wrap is resolved, the lateral ulcer is healed. She admits to noncompliance with wearing offloading shoe and admits to glucose levels being greater than 300 most of the week. She continues to smoke and expresses no desire to quit. There is one area medially that probes deeper than it has historically, erythema to the toe and dorsal foot has consistently waxed and waned. There is no overt signs of cellulitis or infection but we will culture the wound for any occult infection given the new area of depth and erythema. We will hold off on sensitivities for initiation of antibiotic therapy. 02/22/18-She is here in follow up evaluation for left great toe ulcer. There is overall significant improvement in both wound appearance, erythema and edema with changes made last week. She was not initiated on antibiotic therapy. Culture obtained last week showed oxacillin sensitive staph aureus, sensitive to clindamycin. Clindamycin has been called into the pharmacy but she has been instructed to hold off on initiation secondary to overall clinical improvement and her history of antibiotic intolerance. She has been instructed to contact the clinic with any noted changes/deterioration and the wound, erythema, edema and/or pain. She will follow-up next week. She continues to smoke and her glucose levels remain elevated >250; she admits to compliance with offloading shoe [name], [name] ([id number]) [id number] Page 2 of 18 03/01/18 on evaluation today patient appears to be doing fairly well in regard to her left first toe ulcer. She has been tolerating the dressing changes with the [facility] Dressing without complication and overall this has definitely showed signs of  improvement according to records as well is what the patient tells me today. I'm very pleased in that regard. She is having no pain today 03/08/18 She is here for follow up evaluation of [name] left great toe  ulcer. She remains non-compliant with glucose control and smoking cessation; glucose levels consistently >200. She states that she got new shoe inserts/peg assist. She admits to compliance with offloading. Since my last evaluation there is significant improvement. We will switch to prisma at this time and she will follow up next week. She is noted to be tachycardic at this appointment, heart rate 120s; she has [name] history of heart rate 70-130 according to our records. She admits to extreme agitation r/t personal issues; she was advised to monitor her heartrate and contact her physician if it does not return to [name] more normal range (<100). She takes cardizem twice daily. 03/15/18-She is here in follow-up evaluation for left great toe ulcer. She remains noncompliant with glucose control and smoking cessation. She admits to compliance with wearing offloading shoe. The ulcer is improved/stable and we will continue with the same treatment plan and she will follow-up next week 03/22/18-She is here for evaluation for left great toe ulcer. There continues to be significant improvement despite recurrent hyperglycemia (over 500 yesterday) and she continues to smoke. She has been compliant with offloading and we will continue with same treatment plan and she will follow-up next week. 03/29/18-She is here for evaluation for left great toe ulcer. Despite continuing to smoke and uncontrolled diabetes she continues to improve. She is compliant with offloading shoe. We will continue with the same treatment plan and she will follow-up next week 04/05/18- She is here in follow up evaluation for [name] left great toe ulcer; she presents with small pustule to left fifth toe (resembles ant bite). She admits to compliance with wearing offloading shoe; continues to smoke or have uncontrolled blood glucose control. There is more callus than usual with evidence of bleeding; she denies known trauma. 04/12/18-She is here for evaluation of  left great toe ulcer. Despite noncompliance with glycemic control and smoking she continues to make improvement. She continues to wear offloading shoe. The pustule, that was identified last week, to the left fifth toe is resolved. She will follow-up in 2 weeks 05/03/18-she is seen in follow-up evaluation for [name] left great toe ulcer. She is compliant with offloading, otherwise noncompliant with glycemic control and smoking. She has plateaued and there is minimal improvement noted. We will transition to [facility], replaced the insert to her surgical shoe and she will follow-up in one week 05/10/18- She is here in follow up evaluation for [name] left great toe ulcer. It appears stable despite measurement change. We will continue with same treatment plan and follow up next week. 05/24/18-She is seen in follow-up evaluation for [name] left great toe ulcer. She remains compliant with offloading, has made significant improvement in her diet, decreasing the amount of sugar/soda. She said her recent A1c was 10.9 which is lower than. She did see [name] diabetic nutritionist/educator yesterday. She continues to smoke. We will continue with the same treatment plan and she'll follow-up next week. 05/31/18- She is seen in follow-up evaluation for left great toe ulcer. She continues to remain compliant with offloading, continues to make improvement in her diet, increasing her water and decreasing the amount of sugar/soda. She does continue to smoke with no desire to quit. We will apply Prisma to the depth and Hydrofera Blue over. We have not received  insurance authorization for oasis. She will follow up next week. 06/07/18-She is seen in follow-up evaluation for left great toe ulcer. It has stalled according to today's measurements although base appears stable. She says she saw [name] diabetic educator yesterday; her average blood sugars are less than 300 which is an improvement for her. She continues to smoke and states "that's my next  step" She continues with water over soda. We will order for xray, culture and reinstate ace wrap compression prior to placing apligraf for next week. She is voicing no complaints or concerns. Her dressing will change to iodoflex over the next week in preparation for apligraf. 06/14/18-She is seen in follow-up evaluation for left great toe ulcer. Plain film x-ray performed last week was negative for osteomyelitis. Wound culture obtained last week grew strep B and OSSA; she is initiated on keflex and cefdinir today; there is erythema to the toe which could be from ace wrap compression, she has [name] history of wrapping too tight and has has been encouraged to maintain ace wraps that we place today. We will hold off on application of apligraf today, will apply next week after antibiotic therapy has been initiated. She admits today that she has resumed taking [name] shower with her foot/toe submerged in water, she has been reminded to keep foot/toe out of the bath water. She will be seen in follow up next week 06/21/18-she is seen in follow-up evaluation for left great toe ulcer. She is tolerating antibiotic therapy with no GI disturbance. The wound is stable. Apligraf was applied today. She has been decreasing her smoking, only had 4 cigarettes yesterday and 1 today. She continues being more compliant in diabetic diet. She will follow-up next week for evaluation of site, if stable will remove at 2 weeks. 06/28/18- She is here in follow up evalution. Apligraf was placed last week, she states the dressing fell off on Tuesday and she was dressing with hydrofera blue. She is healed and will be discharged from the clinic today. She has been instructed to continue with smoking cessation, continue monitoring glucose levels, offloading for an additional 4 weeks and continue with hydrofera blue for additional two weeks for any possible microscopic opening. Readmission: 08/07/18 on evaluation today patient presents for  reevaluation concerning the ulcer of her right great toe. She was previously discharged on 06/28/18 healed. Nonetheless she states that this began to show signs of drainage she subsequently went to her primary care provider. Subsequently an x-ray was performed on 08/01/18 which was negative. The patient was also placed on antibiotics at that time. Fortunately they should have been effective for the infection. Nonetheless she's been experiencing some improvement but still has [name] lot of drainage coming from the wound itself. 08/14/18 on evaluation today patient's wound actually does show signs of improvement in regard to the erythema at this point. She has completed the antibiotics. With that being said we did discuss the possibility of placing her in [name] total contact cast as of today although I think that I may want to give this just [name] little bit more time to ensure nothing recurrence as far as her infection is concerned. I do not want to put in the cast and risk infection at that time if things are not completely resolved. With that being said she is gonna require some debridement today. 08/21/18 on evaluation today patient actually appears to be doing okay in regard to her toe ulcer. She's been tolerating the dressing changes without complication. With that being  said it does appear that she is ready and in fact I think it's appropriate for [place] to go ahead and initiate the total contact cast today. Nonetheless she will require some sharp debridement to prepare the wound for application. Overall I feel like things have been progressing well but we do need to do something to get this to close more readily. 08/24/18 patient seen today for reevaluation after having had the total contact cast applied on Tuesday. She seems to have done very well the wound appears to be doing great and overall I'm pleased with the progress that she's made. There were no abnormal areas of rubbing from the cast on her lower  extremity. 08/30/18 on evaluation today patient actually appears to be completely healed in regard to her plantar toe ulcer. She tells me at this point she's been having [name] lot of issues with the cast. She almost fell [name] couple of times the state shall the step of her dog [name] couple times as well. This is been [name] very frustrating process for her other nonetheless she has completely healed the wound which is excellent news. Overall there does not appear to be the evidence of infection at this time which is great news. 09/11/18 evaluation today patient presents for follow-up concerning her great toe ulcer on the left which has unfortunately reopened since I last saw her which was only [name] couple of weeks ago. Unfortunately she was not able to get in to get the shoe and potentially the AFO that's gonna be necessary due to her left foot drop. She continues with offloading shoe but this is not enough to prevent her from reopening it appears. When we last had her in the total contact cast she did well from [name] healing standpoint but unfortunately the wound reopened as soon as she came out of the cast within just [name] couple of weeks. 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 This is also part of the reason why she falls on [name] regular basis. I just do not believe that is gonna be safe for [place] to reinitiate the total contact cast as last time we had this on she fell 3 times one week which is definitely not normal for her. 09/18/18 upon evaluation today the patient actually appears to be doing about the same in regard to her toe ulcer. She did not contact [employer] as I asked her to even though I had given her the prescription. In fact she actually states that she has no idea where the prescription is. She did apparently call  [employer] and they told her that all she needed to do was bring the prescription in order to be able to be seen and work on getting the AFO for her left foot. With all that being said she still does not have an appointment and I'm not sure were things stand that regard. I will give her [name] new prescription today in order to contact them to get this set up. 09/25/18 on evaluation today patient actually appears to be doing about the same in regard to her toes ulcer. She does have [name] small areas which seems to have [name] lot of callous buildup around the edge of the wound which is going to need sharp debridement today. She still  is waiting to be scheduled for evaluation with [employer] for possibility of an AFO. She states there supposed to call her tomorrow to get this set up. Unfortunately it does appear that her foot specifically the toe area is showing signs of erythema. There does not appear to be any systemic infection which is in these good news. 10/02/18 on evaluation today patient actually appears to be doing about the same in regard to her toe ulcer. This really has not done too well although it's not [name], [name] ([id number]) [id number] Page 3 of 18 significantly larger it's also not significantly smaller. She has been tolerating the dressing changes without complication. She actually has her appointment with [employer] and [place] tomorrow to hopefully be measured for obtaining and AFO splint. I think this would be helpful preventing this from reoccurring. We had contemplated starting the cast this week although to be honest I am reluctant to do that as she's been having nausea, vomiting, and seizure activity over the past three days. She has [name] history of seizures and have been told is nothing that can be done for these. With that being said I do believe that along with the seizures have the nausea vomiting which upon further questioning doesn't seem to be the normal for her  and makes me concerned for the possibility of infection or something else going on. I discussed this with the patient and her mother during the office visit today. I do not feel the wound is effective but maybe something else. The responses this was "this just happens to her at times and we don't know why". They did not seem to be interested in going to the hospital to have this checked out further. 10/09/18 on evaluation today patient presents for follow-up concerning her ongoing toe ulcer. She has been tolerating the dressing changes without complication. Fortunately there does not appear to be any evidence of infection which is great news however I do think that the patient would benefit from going ahead for with the total contact cast. She's actually in [name] wheelchair today she tells me that she will use her walker if we initiate the cast. I was very specific about the fact that if we were gonna do the cast I wanted to make sure that she was using the walker in order to prevent any falls. She tells me she does not have stairs that she has to traverse on [name] regular basis at her home. She has not had any seizures since last week again that something that happens to her often she tells me she did talk to [employer] and they said that it may take up to three weeks to get the brace approved for her. Hopefully that will not take that long but nonetheless in the meantime I do think the cast could be of benefit. 10/12/18 on evaluation today patient appears to be doing rather well in regard to her toe ulcer. It's just been [name] few days and already this is significantly improved both as far as overall appearance and size. Fortunately there's no sign of infection. She is here for her first obligatory cast change. 10/19/18 Seen today for follow up and management of left great toe ulcer. Wound continues to show improvement. Noted small open area with seroussang drainage with palpation. Denies any increased pain or  recent fevers during visit. She will continue calcium alginate with offloading shoe. Denies any questions or concerns during visit. 10/26/18 on evaluation today patient appears to be doing about the  same as when I last saw her in regard to her wound bed. Fortunately there does not appear to be any signs of infection. Unfortunately she continues to have [name] breakdown in regard to the toe region any time that she is not in the cast. It takes almost no time at all for this to happen. Nonetheless she still has not heard anything from the brace being made by [employer] as to when exactly this will be available to her. Fortunately there is no signs of infection at this time. 10/30/18 on evaluation today patient presents for application of the total contact cast as we just received him this morning. Fortunately we are gonna be able to apply this to her today which is great news. She continues to have no significant pain which is good news. Overall I do feel like things have been improving while she was the cast is when she doesn't have [name] cast that things get worse. She still has not really heard anything from [employer] regarding her brace. 11/02/18 upon evaluation today patient's wound already appears to be doing significantly better which is good news. Fortunately there does not appear to be any signs of infection also good news. Overall I do think the total contact cast as before is helping to heal this area unfortunately it's just not gonna likely keep the area closed and healed without her getting her brace at least. Again the foot drop is [name] significant issue for her. 11/09/18 on evaluation today patient appears to be doing excellent in regard to her toe ulcer which in fact is completely healed. Fortunately we finally got the situation squared away with the paperwork which was needed to proceed with getting her brace approved by Medicaid. I have filled that out unfortunately that information has been sent to the  orthopedic office that I worked at 2 1/2 years ago and not tired Current wound care measures. Fortunately she seems to be doing very well at this time. 11/23/18 on evaluation today patient appears to be doing More Poorly Compared to Last Time I Saw Her. At [facility] She Had Completely Healed. Currently she is continuing to have issues with reopening. She states that she just found out that the brace was approved through Medicaid now she just has to go get measured in order to have this fitted for her and then made. Subsequently she does not have an appointment for this yet that is going to complicate things we obviously cannot put her back in the cast if we do not have everything measured because they're not gonna be able to measure her foot while she is in the cast. 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 Obviously this is unfortunate and does have me somewhat worried as well. 11/30/18 on evaluation today patient's toe ulcer actually appears to be doing fairly well. The good news is she will be getting her brace in the shoes next week on Wednesday. Hopefully we will be able to get this to heal without having to go back in the cast however she may need the cast in order to get the wound completely heal and then go from there. Fortunately there's no signs of infection at this time. 12/07/18 on evaluation today patient fortunately did receive her brace and she states she could tell this definitely makes her walk better. With that being said she's been having issues with  her toe where she noticed yesterday there was [name] lot of tissue that was loosing off this appears to be much larger than what it was previous. She also states that her leg has been read putting much across the top of her foot just about the ankle although this seems to be receiving somewhat. The total area is still red and appears to be  someone infected as best I can tell. She is previously taken Bactrim and that may be [name] good option for her today as well. We are gonna see what I wound culture shows as well and I think that this is definitely appropriate. With that being said outside of the culture I still need to initiate something in the interim and that's what I'm gonna go ahead and select Bactrim is [name] good option for her. 12/14/18 on evaluation today patient appears to be doing better in regard to her left great toe ulcer as compared to last week's evaluation. There's still some erythema although this is significantly improved which is excellent news. Overall I do believe that she is making good progress is still gonna take some time before she is where I would like her to be from the standpoint of being able to place her back into the total contact cast. Hopefully we will be where we need to be by next week. 12/21/18 on evaluation today patient actually appears to be doing poorly in regard to her toe ulcer. She's been tolerating the dressing changes without complication. Fortunately there's no signs of systemic infection although she does have [name] lot of drainage from the toe ulcer and this does seem to be causing some issues at this point. She does have erythema on the distal portion of her toe that appears to be likely cellulitis. 12/28/18 on evaluation today patient actually appears to be doing [name] little better in my pinion in regard to her toe ulcer. With that being said she still does have some evidence of infection at this time and for her culture she had both E. coli as well as enterococcus as organisms noted on evaluation. For that reason I think that though the Keflex likely has treated the E. coli rather well this has really done nothing for the enterococcus. We are going to have to initiate treatment for this specifically. 01/04/19 on evaluation today patient's toe actually appears to be doing better from the standpoint of  infection. She currently would like to see about putting the cash back on I think that this is appropriate as long as she takes care of it and keeps it from getting wet. She is gonna have some drainage we can definitely pass this up with Drawtex and alginate to try to prevent as much drainage as possible from causing the problems. With that being said I do want to at least try her with the cast between now and Tuesday. If there any issues we can't continue to use it then I will discontinue the use of the cast at that point. 01/08/19 on evaluation today patient actually appears to be doing very well as far as her foot ulcer specifically the great toe on the left is concerned. She did have an area of rubbing on the medial aspect of her left ankle which again is from the cast. Fortunately there's no signs of infection at this point in this appears to be [name] very slight skin breakdown. The patient tells me she felt it rubbing but didn't think it was that bad. Fortunately there  is no signs of active infection at this time which is good news. No fevers, chills, nausea, or vomiting noted at this time. 01/15/19 on evaluation today patient actually appears to be doing well in regard to her toe ulcer. Again as previous she seems to do well and she has the cast on which indicates to me that during the time she doesn't have [name] cast on she's putting way too much pressure on this region. Obviously I think that's gonna be an issue as with the current national emergency concerning the Covid-19 Virus it has been recommended that we discontinue the use of total contact casting by the chief medical officer of our company, Dr. Maurine Minister. The reasoning is that if [name] patient becomes sick and cannot come into have the cast removed they could not just leave this on for an additional two weeks. Obviously the hospitals also do not want to receive patient's who are sick into the emergency department to potentially contaminate the region  and spread the Covid-19 Virus among other sick individuals within the hospital system. Therefore at this point we are suspending the use of total contact cast until the current emergency subsides. This was all discussed with the patient today as well. [name], [name] ([id number]) [id number] Page 4 of 18 01/22/19 on evaluation today patient's wound on her left great toe appears to be doing slightly worse than previously noted last week. She tells me that she has been on this quite [name] bit in fact she tells me she's been awake for 38 straight hours. This is due to the fact that she's having to care for grandparents because nobody else will. She has been taking care of them for five the last seven days since I've seen her they both have dementia his is from [name] stroke and her grandmother's was progressive. Nonetheless she states even her mom who knows her condition and situation has only help two of those days to take care of them she's been taking care of the rest. Fortunately there does not appear to be any signs of active infection in regard to her toe at this point although obviously it doesn't look as good as it did previous. I think this is directly related to her not taking off the pressure and friction by way of taking things easy. Though I completely understand what's going on. 01/29/19 on evaluation today patient's tools are actually appears to be showing some signs of improvement today compared to last week's evaluation as far as not necessarily the overall size of the wound but the fact that she has some new skin growth in between the two ends of the wound opening. Overall I feel like she has done well she states that she had [name] family member give her what sounds to be [name] CAM walker boot which has been helpful as well. 02/05/19 on evaluation today patient's wound bed actually appears to be doing significantly better in regard to her overall appearance of the size of the  wound. With that being said she is still having an issue with offloading efficiently enough to get this to close. Apparently there is some signs of infection at this point as well unfortunately. Previously she's done well of Augmentin I really do not see anything that needs to be culture currently but there theme and cellulitis of the foot that I'm seeing I'm gonna go ahead and place her on an antibiotic today to try to help clear this up. 02/12/2019 on evaluation today patient actually  appears to be doing poorly in regard to her overall wound status. She tells me she has been using her offloading shoe but actually comes in today wearing her tennis shoe with the AFO brace. Again as I previously discussed with her this is really not sufficient to allow the area to heal appropriately. Nonetheless she continues to be somewhat noncompliant and I do wonder based on what she has told my nurse in the past as to whether or not [name] good portion of this noncompliance may be recreational drug and alcohol related. She has had [name] history of heroin overdose and this was fairly recently in the past couple of months that have been seeing her. Nonetheless overall I feel like her wound looks significantly worse today compared to what it was previous. She still has significant erythema despite the Augmentin I am not sure that this is an appropriate medication for her infection I am also concerned that the infection may have gone down into her bone. 02/19/19 on evaluation today patient actually appears to be doing about the same in regard to her toe ulcer. Unfortunately she continues to show signs of bone exposure and infection at this point. There does not appear to be any evidence of worsening of the infection but I'm also not really sure that it's getting significantly better. She is on the Augmentin which should be sufficient for the Staphylococcus aureus infection that she has at this point. With that being said she may  need IV antibiotics to more appropriately treat this. We did have [name] discussion today about hyperbaric option therapy. 02/28/19 on evaluation today patient actually appears to be doing much worse in regard to the wound on her left great toe as compared to even my previous evaluation last week. Unfortunately this seems to be training in [name] pretty poor direction. Her toe was actually now starting to angle laterally and I can actually see the entire joint area of the proximal portion of the digit where is the distal portion of the digit again is no longer even in contact with the joint line. Unfortunately there's [name] lot more necrotic tissue around the edge and the toe appears to be showing signs of becoming gangrenous in my pinion. I'm very concerned about were things stand at this point. She did see infectious disease and they are planning to send in [name] prescription for Sivextro for her and apparently this has been approved. With that being said I don't think she should avoid taking this but at the same time I'm not sure that it's gonna be sufficient to save her toe at this point. She tells me that she still having to care for grandparents which I think is putting quite [name] bit of strain on her foot and specifically the total area and has caused this to break down even to [name] greater degree than would've otherwise been expected. 03/05/19 on evaluation today patient actually appears to be doing quite well in regard to her toe all things considering. She still has bone exposed but there appears to be much less your thing on overall the appearance of the wound and the toe itself is dramatically improved. She still does have some issues currently obviously with infection she did see vascular as well and there concerned that her blood flow to the toad. For that reason they are setting up for an angiogram next week. 03/14/19 on evaluation today patient appears to be doing very poor in regard to her toe and specifically in  regard to the ulceration and the fact that she's starting to notice the toe was leaning even more towards the lateral aspect and the complete joint is visible on the proximal aspect of the joint. Nonetheless she's also noted [name] significant odor and the tip of the toe is turning more dark and necrotic appearing. Overall I think she is getting worse not better as far as this is concerned. For that reason I am recommending at this point that she likely needs to be seen for likely amputation. READMISSION 03/19/2021 This is [name] patient that we cared for in this clinic for [name] prolonged period of time in 2019 and 2020 with [name] left foot and left first toe wound. 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 Excell Seltzer. In December 2021 she had an ulcer on her right great toe as well as the fourth and fifth toes. 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 She also had an angiogram at that time and underwent angioplasty of the right anterior tibial artery. In any case she claims that the wound on the right foot is closed I did not look at this today which was probably an oversight although I think that should be done next week. 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 According to Dr. Bernette Redbird last review that she was out of the area being cared for by another physician but recently came back to his attention. 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 [name] culture of this area showed E. coli apparently before she came back to see Dr. Excell Seltzer she was supposed to be receiving antibiotics but she did not really take them. Nor is she offloading this area at all. Finally her last hemoglobin A1c listed in epic was in March 2022 at 14.1 she says things are [name] lot better since then although I am not sure. She was hospitalized in  March with metabolic multifactorial encephalopathy. 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 She had this wound at the time. During this admission she had E. coli sepsis [name] TEE was negative. 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 She is still [name] heavy cigarette smoker. She has [name] history of bipolar disorder seizure disorder ABI in our clinic was 1.05 6/1; left midfoot in the setting of [name] TMA done previously. Round circular wound with [name] "knuckle" of protruding tissue. The problem is that the knuckle was not attached to any of the surrounding granulation and this probed proximally widely I removed [name] large portion of this tissue. This wound goes with considerable undermining laterally. I do not feel any bone there was no purulence but this is [name] deep wound. 6/8; in spite of the debridement I did last week. She arrives with [name] wound looking exactly the same. [name] protruding "knuckle" of tissue nonadherent to most of the surrounding tissue. There is considerable depth around this from 6-12 o'clock at 2.7 cm and undermining of 1 cm. This does not look overtly infected and the x- ray I did last week was negative for any osseous abnormalities. We have been using  silver collagen 6/15; deep tissue culture I did last week showed moderate staph aureus and moderate Pseudomonas. This will definitely require prolonged antibiotic therapy. The pathology on the protuberant area was negative for malignancy fungus etc. 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 We have been using silver collagen. I am going to be prescribing Levaquin for 2 weeks. Her CT scan of the foot is down for 7/5 6/22; CT scan  of the foot on 7 5. She says she has hardware in the left leg from her previous fracture. She is on the Levaquin for the deep tissue culture I did that showed methicillin sensitive staph aureus and Pseudomonas. I gave her [name] 2-week supply and she will have another week. She arrives in clinic today with the same protuberant tissue however this is nonadherent to the tissue surrounding it. I am really at [name] loss to explain this unless there is underlying deep tissue infection 6/29; patient presents for 1 week follow-up. She has been using collagen to the wound bed. She reports taking her antibiotics as prescribed.She has no complaints or issues today. She denies signs of infection. 7/6; patient presents for one week followup. She has been using collagen to the wound bed. She states she is taking Levaquin however at times she is not able to keep it down. She denies signs of infection. 7/13; patient presents for 1 week follow-up. She has been using silver alginate to the wound bed. She still has nausea when taking her antibiotics. She denies signs of infection. 7/20; patient presents for 1 week follow-up. She has been using silver alginate with gentamicin cream to the wound bed. She denies any issues and has no [name], [name] ([id number]) [id number] Page 5 of 18 complaints today. She denies signs of infection. 7/27; patient presents for 1 week follow-up. She continues to use silver alginate with gentamicin cream to the wound bed. She reports starting her antibiotics. She has no issues or complaints. Overall she reports stability to the wound. 8/3; patient presents for 1 week follow-up. She has been using silver alginate with gentamicin cream to the wound bed. She reports completing all antibiotics. She has no issues or complaints today. She denies signs of infection. 8/17; patient presents for 2-week follow-up. He is to use silver alginate to the wound bed. She has no issues  or complaints today. She denies signs of infection. She reports her pain has improved in her foot since last clinic visit 8/24; patient presents for 1 week follow-up. She continues to use silver alginate to the wound bed. She has no issues or complaints. She denies signs of infection. Pain is stable. 9/7; patient presents for follow-up. She missed her last week appointment due to feeling ill. She continues to use silver alginate. She has [name] new wound to the right lower extremity that is covered in eschar. She states It occurred over the past week and has no idea how it started. She currently denies signs of infection. 9/14; patient presents for follow-up. T the left foot wound she has been using gentamicin cream and silver alginate. T the right lower extremity wound she has o o been keeping this covered and has not obtain Santyl. 9/21; patient presents for follow-up. She reports using gentamicin cream and silver alginate to the left foot and Santyl to the right lower extremity wound. She has no issues or complaints today. She denies signs of infection. 9/28; patient presents for follow-up. She reports [name] new  wound to her right heel. She states this occurred [name] few days ago and is progressively gotten worse. She has been trying to clean the area with [name] Q-tip and Santyl. She reports stability in the other 2 wounds. She has been using gentamicin cream and silver alginate to the left foot and Santyl to the right lower extremity wound. 10/12; patient presents for follow-up. She reports improvement to the wound beds. She is seeing vein and vascular to discuss the potential of [name] left BKA. She states they are going to do an arteriogram. She continues to use silver alginate with dressing changes to her wounds. 11/2; patient presents for follow-up. She states she has not been doing dressing changes to the wound beds. She states she is not able to offload the areas. She reports chronic pain to her left foot  wound. 11/9; patient presents for follow-up. She came in with only socks on. She states she forgot to put on shoes. It is unclear if she is doing any dressing changes. She currently denies systemic signs of infection. 11/16; patient presents for follow-up. She came again only with socks on. She states she does not wear shoes ever. It is unclear if she does dressing changes. She currently denies systemic signs of infection. 11/23; patient presents for follow-up. She wore her shoes today. It still unclear exactly what dressing she is using for each wound but she did states she obtained Dakin's solution and has been using this to the left foot wound. She currently denies signs of infection. 11/30; patient presents for follow-up. She has no issues or complaints today. She currently denies signs of infection. 12/7; patient presents for follow-up. She has no issues or complaints today. She has been using Hydrofera Blue to the right heel wound and Dakin solution to the left foot wound. Her right anterior leg wound is healed. She currently denies signs of infection. 12/14; patient presents for follow-up. She has been using Hydrofera Blue to the right heel and Dakin's to the left foot wounds. She has no issues or complaints today. She denies signs of infection. 12/21; patient presents for follow-up. She reports using Hydrofera Blue to the right heel and Dakin's to the left foot wound. She denies signs of infection. 12/28; patient presents for follow-up. She continues to use Dakin's to the left foot wound and Hydrofera Blue to the right heel wound. She denies signs of infection. 1/4; patient presents for follow-up. She has no issues or complaints today. She denies signs of infection. 1/11; patient presents for follow-up. It is unclear if she has been dressing these wounds over the past week. She currently denies signs of infection. 1/18; patient presents for follow-up. She states she has been using Dakin's  wet-to-dry dressings to the left foot. She has been using Hydrofera Blue to the right foot foot wound. She states that the anterior right leg wound has reopened and draining serous fluid. She denies signs of infection. 1/25; patient presents for follow-up. She has no issues or complaints today. 2/1; patient presents for follow-up. She has no issues or complaints today. She denies signs of infection. 2/8; patient presents for follow-up. She has lost her surgical shoes. She did not have [name] dressing to the right heel wound. She currently denies signs of infection. 2/15; patient presents for follow-up. She reports more pain to the right heel today. She denies purulent drainage Or fever/chills 2/22; patient presents for follow-up. She reports taking clindamycin over the past week. She states that she continues  to have pain to her right heel. She reports purulent drainage. Readmission 03/16/2022 Ms. Polette Nofsinger is [name] 47 year old female with [name] past medical history of type 2 diabetes, osteomyelitis to her feet, chronic systolic heart failure and bipolar disorder that presents to the clinic for bilateral feet wounds and right lower extremity wound. She was last seen in our clinic on 12/15/2021. At that time she had purulent drainage coming out of her right plantar foot and I recommended she go to the ED. She states she went to [facility] and has been there for the past 3 months. I cannot see the records. She states she had OR debridement and was on several weeks of IV antibiotics while inpatient. Since discharge she has not been taking care of the wound beds. She had nothing on her feet other than socks today. She currently denies signs of infection. 5/31; patient presents for follow-up. She has been using Dakin's wet-to-dry dressings to the wound beds on her feet bilaterally and antibiotic ointment to the right anterior leg wound. She had [name] wound culture done at last clinic visit that showed  moderate Pseudomonas aeruginosa sensitive to ciprofloxacin. She currently denies systemic signs of infection. 6/14; patient presents for follow-up. She received Keystone 5 days ago and has been using this on the wound beds. She states that last week she had to go to the hospital because she had increased warmth and erythema to the right foot. She was started on 2 oral antibiotics. She states she has been taking these. She currently denies systemic signs of infection. She has no issues or complaints today. 6/21; patient presents for follow-up. She states she has been using Keystone antibiotics to the wound beds. She has no issues or complaints today. She denies signs of infection. 6/28; patient presents for follow-up. She has been using Keystone antibiotics to the wound beds. She has no issues or complaints today. 7/12; patient presents for follow-up. Has been using Keystone antibiotics to the wound beds with calcium alginate. She has no issues or complaints today. She never followed up with her orthopedic surgeon who did the OR debridement to the right foot. We discussed the total contact cast for the left foot and patient would like to do this next week. 7/19; patient presents for follow-up. She has been using Keystone antibiotics with calcium alginate to the wound beds. She has no issues or complaints today. [name], [name] ([id number]) [id number] Page 6 of 18 Patient is in agreement to do the total contact cast of the left foot today. She knows to return later this week for the obligatory cast change. 05-13-2022 upon evaluation today patient's wound which she has the cast of the left leg actually appears to be doing significantly better. Fortunately I do not see any signs of active infection locally or systemically which is great news and overall I am extremely pleased with where we stand currently. 7/26; patient presents for follow-up. She has [name] cast in place for the  past week. She states it irritated her shin. Other than that she tolerated the cast well. She states she would like [name] break for 1 week from the cast. We have been using Keystone antibiotic and Aquacel to both wound beds. She denies signs of infection. 8/2; patient presents for follow-up. She has been using Keystone and Aquacel to the wound beds. She denies any issues and has no complaints. She is agreeable to have the cast placed today for the left leg. 06-03-2022 upon evaluation today  patient appears to be doing well with regard to her wound she saw some signs of improvement which is great news. Fortunately I do not see any evidence of active infection locally or systemically at this time which is great news. No fevers, chills, nausea, vomiting, or diarrhea. 8/16; patient presents for follow-up. She has no issues or complaints today. We have been using Keystone and Aquacel to the wound beds. The left lower extremity is in [name] total contact cast. She is tolerated this well. 8/23; patient presents for follow-up. She has had the total contact cast on the left leg for the past week. Unfortunately this has rubbed and broken down the skin to the medial foot. She currently denies signs of infection. She has been using Keystone antibiotic to the right plantar foot wound. 8/30; patient presents for follow-up. We have held off on the total contact cast for the left leg for the past week. Her wound on the left foot has improved and the previous surrounding breakdown of skin has epithelialized. She has been using Keystone antibiotic to both wound beds. She has no issues or complaints today. She denies signs of infection. 9/6; patient presents for follow-up. She has ordered her's Keystone antibiotic and this is arriving this week. She has been doing Dakin's wet-to-dry dressings to the wound beds. She denies signs of infection. She is agreeable to the total contact cast today. 9/13; patient presents for follow-up.  She states that the cast caused her left leg shin to be sore. She would like to take [name] break from the cast this week. She has been using Keystone antibiotic to the right plantar foot wound. She denies signs of infection. 9/20; patient presents for follow-up. She has been using Keystone antibiotics to the wound beds with calcium alginate to the right foot wound and Hydrofera Blue to the left foot wound. She is agreeable to having the cast placed today. She has been approved for Apligraf and we will order this for next clinic visit. 9/27; patient presents for follow-up. We have been using Keystone antibiotic with Hydrofera Blue to the left foot wound under [name] total contact cast. T the right o foot wound she has been using Keystone antibiotic and calcium alginate. She declines [name] total contact cast today. Apligraf is available for placement and she would like to proceed with this. 07-28-2022 upon evaluation today patient appears to be doing well currently in regard to her wound. She is actually showing signs of significant improvement which is great news. Fortunately I do not see any evidence of active infection locally nor systemically at this time. She has been seeing Dr. Mikey Bussing and to be honest has been doing very well with the cast. Subsequently she comes in today with [name] cast on and we did reapply that today as well. She did not really want to she try to talk me out of that but I explained that if she wanted to heal this is really the right way to go. Patient voiced understanding. In regard to her right foot this is actually [name] lot better compared to the last time I saw her which is also great news. 10/11; patient presents for follow-up. Apligraf and the total contact cast was placed to the left leg at last clinic visit. She states that her right foot wound had burning pain to it with the placement of Apligraf to this area. She has been doing [place] over this area. She denies signs of infection  including increased warmth, erythema or  purulent drainage. 11/1; 3-week follow-up. The patient fortunately did not have [name] total contact cast or an Apligraf and on the left foot. She has been using Keystone ABD pads and kerlix and her own running shoes She arrives in clinic today with thick callus and [name] very poor surface on the left foot on the right nonviable skin subcutaneous tissue and [name] deep probing hole. 11/15; patient missed her last clinic appointment. She states she has not been dressing the wound beds for the past 2 weeks. She states that at she had [name] new roommate but is now going back to live with her mother. Apparently its been [name] distracting 2 weeks. Patient currently denies signs of infection. 11/22; patient presents for follow-up. She states she has been using Keystone antibiotic and Dakin's wet-to-dry dressings to the wound beds. She is agreeable for cast placement today. We had ordered Apligraf however this has not been received by our facility. 11/29; Patient had [name] total contact cast placed at last clinic visit and she tolerated this well. We were using silver alginate under the cast. Patient's been using Keystone antibiotic with Aquacel to the right plantar foot wound. She has no issues or complaints today. Apligraf is available for placement today. Patient would like to proceed with this. 12/6; patient presents for follow-up. She had Apligraf placed in standard fashion last clinic visit under the total contact cast to the left lower extremity. She has been using Keystone antibiotic and Aquacel to the right plantar foot wound. She has no issues or complaints today. 12/13; patient presents for follow-up. She has finished 5 Apligraf placements. Was told she would not qualify for more. We have been doing [name] total contact cast to the left lower extremity. She has been using Keystone antibiotic and Aquacel to the right plantar foot wound. She has no issues or complaints today. 12/20;  patient presents for follow-up. We have been using Hydrofera Blue with Keystone antibiotic under [name] total contact cast of the left lower extremity. She reports using Keystone antibiotic and silver alginate to the right heel wound. She has no issues or complaints today. 12/27; patient presents with [name] healthy wound on the left midfoot. We have Apligraf to apply that to that more also using [name] total contact cast. On the right we are using Keystone and silver alginate. She is offloading the right heel with [name] surgical shoealthough by her admission she is on her feet quite [name] bit 1/3; patient presents for follow-up. Apligraf was placed to the wound beds last clinic visit. She was placed in [name] total contact cast to the left lower extremity. She declines [name] total contact cast today. She states that her mother is in the hospital and she cannot adequately get around with the cast on. 1/10; patient presents for follow-up. She declined the total contact cast at last clinic visit. Both wounds have declined in appearance. She states that she has been on her feet and not offloading the wound beds. She currently denies signs of infection. 1/17; patient presents for follow-up. She had the total contact cast along with Apligraf placed last week to the left lower extremity. She tolerated this well. She has been using Aquacel Ag and Keystone antibiotic to the right heel wound. She currently denies signs of infection. She has no issues or complaints today. 1/24; patient presents for follow-up. We have been using Apligraf to the left foot wound along with [name] total contact cast. She has done well with this. T the right o  heel wound she has been using Aquacel Ag and Keystone antibiotic ointment. She has no issues or complaints today. She denies signs of infection. 1/31; patient presents for follow-up. We have been using Apligraf to the left foot wound along with the total contact cast. She continues to do well with this. To the  right heel we have been using Aquacel Ag and Keystone antibiotic ointment. She has no issues or complaints today. 12-01-2022 upon evaluation patient is seen today on my schedule due to the fact that she unfortunately was in the hospital yesterday. Her cast needed to come [name], [name] ([id number]) [id number] Page 7 of 18 off the only reason she is out of the hospital is due to the fact that they would not take it off at the hospital which is somewhat bewildered reading to me to be perfectly honest. I am not certain why this was but either way she was released and then was placed on my schedule today in order to get this off and reapply the total contact casting as appropriate. I do not have an Apligraf for her today it was applied last week and today's actually expired yesterday as there was some scheduling conflicts with her being in the hospital. Nonetheless we do not have that for reapplication today but the good news that she is not draining too much and the Apligraf can go for up to 2 weeks so I am going to go ahead and reapply the total contact casting but we are going to leave the Apligraf in place. 2/14; patient presents for follow-up. T the left leg she has had the total contact cast and Apligraf for the past week. She has had no issues with this. T the o o right heel she has been using antibiotic ointment and Aquacel Ag. 2/21; patient presents for follow-up. We have been using Apligraf and [name] total contact cast to the left lower extremity. She is tolerated this well. Unfortunately she is not approved for any more Apligraf per insurance. She has been using antibiotic ointment and Aquacel Ag to the right foot. She has no issues or complaints today. 2/28; patient presents for follow-up. We have been using Hydrofera Blue and antibiotic ointment under the total contact cast to the left lower extremity. He has been using Aquacel Ag with antibiotic ointment to the right  plantar foot. She has no issues or complaints today. 3/6; patient presents for follow-up. She did not obtain her gentamicin ointment. She has been using Aquacel Ag to the right plantar foot wound. We have been using Hydrofera Blue with antibiotic ointment under the total contact cast to the left lower extremity. She has no issues or complaints today. 3/12; patient presents for follow-up. She has been using gentamicin ointment and Aquacel Ag to the right plantar foot wound. We have been using Hydrofera Blue with antibiotic ointment under the total contact cast on the left lower extremity foot wound. She has no issues or complaints today. 3/20; patient presents for follow-up. She has been using gentamicin ointment and Aquacel Ag to the right plantar foot wound. We have been using Hydrofera Blue with antibiotic ointment under the total contact cast to the left lower extremity wound. She has no issues or complaints today. 3/27; patient presents for follow-up. We have been using antibiotic ointment and Aquacel Ag to the right plantar foot wound. We have been using Hydrofera Blue with antibiotic ointment under the total contact cast to the left lower extremity. Both wounds are smaller. 01-24-2023  upon evaluation today patient actually appears to be doing better compared to last time I saw her. It has been several weeks since I last saw her but nonetheless I think the total contact casting is doing [name] good job. Fortunately I do not see any evidence of infection or worsening overall which is great news and in general I do believe that she is moving in the appropriate direction. It has been [name] very long road but nonetheless I feel like she is nearing the end at least in regard to the left foot and even the right foot looks much better to be perfectly honest. Electronic Signature(s) Signed: 01/26/2023 8:30:25 PM By: Heather Kelp PA-C Entered By: [name] on 01/26/2023  20:30:25 -------------------------------------------------------------------------------- Physical Exam Details Patient Name: Date of Service: KAEDEN, DEPAZ 01/24/2023 1:45 PM Medical Record Number: [id number] Patient Account Number: [id number] Date of Birth/Sex: Treating RN: 13-Apr-1976 (47 y.o. [name] Primary Care Provider: Lindwood [name] Other Clinician: Betha [name] Referring Provider: Treating Provider/Extender: [name] Weeks in Treatment: 6 Constitutional Well-nourished and well-hydrated in no acute distress. Respiratory normal breathing without difficulty. Psychiatric this patient is able to make decisions and demonstrates good insight into disease process. Alert and Oriented x 3. pleasant and cooperative. Notes Upon inspection patient's wound bed actually showed signs of really doing quite well I do not see any need for sharp debridement at this point. I am actually very pleased with where we stand. Electronic Signature(s) Signed: 01/26/2023 8:30:39 PM By: Heather Kelp PA-C Entered By: [name] on 01/26/2023 20:30:39 -------------------------------------------------------------------------------- Physician Orders Details Patient Name: Date of Service: [name], [name] 01/24/2023 1:45 PM Medical Record Number: [id number] Patient Account Number: [id number] Date of Birth/Sex: Treating RN: Apr 01, 1976 (47 y.o. 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 Nurse Visit as needed Bathing/ Shower/ Hygiene May shower with wound dressing protected with water repellent cover or cast protector. No tub bath. Anesthetic (Use 'Patient Medications' Section for Anesthetic Order Entry) Lidocaine applied to wound bed Edema Control - Lymphedema / Segmental Compressive Device / Other Elevate, Exercise Daily and [name] void Standing for Long Periods of Time. Elevate legs to the level of the heart and pump ankles as often as possible Elevate leg(s) parallel to the floor when sitting. Off-Loading Total Contact Cast to Left Lower Extremity Open toe surgical shoe - Heel off loader - Right foot Other: - keep pressure off of feet. Additional Orders / Instructions Follow Nutritious Diet and Increase Protein Intake Other: - Stay off of foot, wheelchair only Wound Treatment Wound #11 - Foot Wound Laterality: Plantar, Right Cleanser: Wound Cleanser 1 x Per Day/30 Days Discharge Instructions: Wash your hands with soap and water. Remove old dressing, discard into plastic bag and place into trash. Cleanse the wound with Wound Cleanser prior to applying [name]  clean dressing using gauze sponges, not tissues or cotton balls. Do not scrub or use excessive force. Pat dry using gauze sponges, not tissue or cotton balls. Topical: Gentamicin 1 x Per Day/30 Days Discharge Instructions: Apply as directed by provider. in clinic Prim Dressing: Aquacel Extra Hydrofiber Dressing, 2x2 (in/in) ary 1 x Per Day/30 Days Secondary Dressing: ABD Pad 5x9 (in/in) (Generic) 1 x Per Day/30 Days Discharge Instructions: Cover with ABD pad Secured With: Medipore T - 2M Medipore H Soft Cloth Surgical  T ape ape, 2x2 (in/yd) 1 x Per Day/30 Days Secured With: [employer] Sterile or Non-Sterile 6-ply 4.5x4 (yd/yd) 1 x Per Day/30 Days Discharge Instructions: Apply Kerlix as directed Wound #12 - Foot Wound Laterality: Plantar, Left, Medial Cleanser: Wound Cleanser 1 x Per Week/30 Days Discharge Instructions: Wash your hands with soap and water. Remove old dressing, discard into plastic bag and place into trash. Cleanse the wound with Wound Cleanser prior to applying [name] clean dressing using gauze sponges, not tissues or cotton balls. Do not scrub or use excessive force. Pat dry using gauze sponges, not tissue or cotton balls. Topical: Gentamicin 1 x Per Week/30 Days Discharge Instructions: Apply as directed by provider. in clinic Prim Dressing: Hydrofera Blue Ready Transfer Foam, 2.5x2.5 (in/in) 1 x Per Week/30 Days ary Discharge Instructions: Apply Hydrofera Blue Ready to wound bed as directed Prim Dressing: Silvercel 4 1/4x 4 1/4 (in/in) 1 x Per Week/30 Days ary Discharge Instructions: Apply to periwound Secondary Dressing: ABD Pad 5x9 (in/in) (Generic) 1 x Per Week/30 Days Discharge Instructions: Cover with ABD pad Secured With: Medipore T - 2M Medipore H Soft Cloth Surgical T ape ape, 2x2 (in/yd) 1 x Per Week/30 Days Secured With: [employer] or Non-Sterile 6-ply 4.5x4 (yd/yd) 1 x Per Week/30 Days Discharge Instructions: Apply Kerlix as directed MACALA, BALDONADO [name] ([id number]) [id number] Page 9 of 18 Electronic Signature(s) Signed: 01/24/2023 4:53:44 PM By: [name] Signed: 01/27/2023 8:47:01 AM By: Heather Kelp PA-C Entered By: [name] on 01/24/2023 14:25:07 -------------------------------------------------------------------------------- Problem List Details Patient Name: Date of Service: [name] 01/24/2023 1:45 PM Medical Record Number: [id number] Patient Account Number: [id number] Date of Birth/Sex: Treating RN: [birth year] (47  y.o. [name] Primary Care Provider: Lindwood [name] Other Clinician: Betha [name] Referring Provider: Treating Provider/Extender: [name] in Treatment: 75 Active Problems ICD-10 Encounter Code Description 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 03/16/2022 No Yes E11.621 Type 2 diabetes mellitus with foot ulcer 03/16/2022 No Yes E11.42 Type 2 diabetes mellitus with diabetic polyneuropathy 03/16/2022 No Yes L97.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 03/16/2022 No Yes of skin Inactive Problems Resolved Problems Electronic Signature(s) Signed: 01/24/2023 2:09:55 PM By: Heather Kelp PA-C Entered By: [name] on 01/24/2023 14:09:55 -------------------------------------------------------------------------------- Progress Note Details Patient Name: Date of Service: [name] 01/24/2023 1:45 PM Medical Record Number: [id number] Patient Account Number: [id number] Date of Birth/Sex: Treating RN: 06/29/1976 (47 y.o. [name] Primary Care Provider: Lindwood [name] Other Clinician: Betha [name] Referring Provider: Treating Provider/Extender: [name] in Treatment: 48 Subjective Chief Complaint Information obtained from Patient 03/19/2021; patient referred by Dr. Excell Seltzer who has been looking after her left foot for quite [name] period of time for review of [name] nonhealing  area in the left midfoot Fielding, Shamar [name] ([id number]) [id number] Page 10 of 18 03/12/2022; bilateral feet wounds and right lower extremity wound. History of Present Illness (HPI) 01/18/18-She is here for initial evaluation of the left great toe ulcer. She is [name] poor historian in regards to timeframe in  detail. She states approximately 4 weeks ago she lacerated her toe on something in the house. She followed up with her primary care who placed her on Bactrim and ultimately [name] second dose of Bactrim prior to coming to wound clinic. She states she has been treating the toe with peroxide, Betadine and [name] Band-Aid. She did not check her blood sugar this morning but checked it yesterday morning it was 327; she is unaware of [name] recent A1c and there are no current records. She saw Dr. she would've orthopedics last week for an old injury to the left ankle, she states he did not see her toe, nor did she bring it to his attention. She smokes approximately 1 pack cigarettes [name] day. Her social situation is concerning, she arrives this morning with her mother who appears extremely intoxicated/under the influence; her mother was asked to leave the room and be monitored by the patient's grandmother. The patient's aunt then accompanied the patient and the room throughout the rest of the appointment. We had [name] lengthy discussion regarding the deleterious effects of uncontrolled hyperglycemia and smoking as it relates to wound healing and overall health. She was strongly encouraged to decrease her smoking and get her diabetes under better control. She states she is currently on [name] diet and has cut down her [facility] consumption. The left toe is erythematous, macerated and slightly edematous with malodor present. The edema in her left foot is below her baseline, there is no erythema streaking. We will treat her with Santyl, doxycycline; we have ordered and xray, culture and provided [name] Peg assist surgical shoe and cultured the wound. 01/25/18-She is here in follow-up evaluation for [name] left great toe ulcer and presents with an abscess to her suprapubic area. She states her blood sugars remain elevated, feeling "sick" and if levels are below 250, but she is trying. She has made no attempt to decrease her smoking stating that we  "can't take away her food in her cigarettes". She has been compliant with offloading using the PEG assist you. She is using Santyl daily. the culture obtained last week grew staph aureus and Enterococcus faecalis; continues on the doxycycline and Augmentin was added on Monday. The suprapubic area has erythema, no femoral variation, purple discoloration, minimal induration, was accessed with [name] cotton tip applicator with sanguinopurulent drainage, this was cultured, I suspect the current antibiotic treatment will cover and we will not add anything to her current treatment plan. She was advised to go to urgent care or ER with any change in redness, induration or fever. 02/01/18-She is here in follow-up evaluation for left great toe ulcers and [name] new abdominal abscess from last week. She was able to use packing until earlier this week, where she "forgot it was there". She states she was feeling ill with GI symptoms last week and was not taking her antibiotic. She states her glucose levels have been predominantly less than 200, with occasional levels between 200-250. She thinks this was contributing to her GI symptoms as they have resolved without intervention. There continues to be significant laceration to left toe, otherwise it clinically looks stable/improved. There is now less superficial opening to the lateral aspect of the great  toe that was residual blister. We will transition to [facility] to all wounds, she will continue her Augmentin. If there is no change or deterioration next week for reculture. 02/08/18-She is here in follow-up evaluation for left great toe ulcer and abdominal ulcer. There is an improvement in both wounds. She has been wrapping her left toe with coban, not by our direction, which has created an area of discoloration to the medial aspect; she has been advised to NOT use coban secondary to her neuropathy. She states her glucose levels have been high over this last week ranging  from 200-350, she continues to smoke. She admits to being less compliant with her offloading shoe. We will continue with same treatment plan and she will follow-up next week. 02/15/18-She is here in follow-up evaluation for left great toe ulcer and abdominal ulcer. The abdominal ulcer is epithelialized. The left great toe ulcer is improved and all injury from last week using the Coban wrap is resolved, the lateral ulcer is healed. She admits to noncompliance with wearing offloading shoe and admits to glucose levels being greater than 300 most of the week. She continues to smoke and expresses no desire to quit. There is one area medially that probes deeper than it has historically, erythema to the toe and dorsal foot has consistently waxed and waned. There is no overt signs of cellulitis or infection but we will culture the wound for any occult infection given the new area of depth and erythema. We will hold off on sensitivities for initiation of antibiotic therapy. 02/22/18-She is here in follow up evaluation for left great toe ulcer. There is overall significant improvement in both wound appearance, erythema and edema with changes made last week. She was not initiated on antibiotic therapy. Culture obtained last week showed oxacillin sensitive staph aureus, sensitive to clindamycin. Clindamycin has been called into the pharmacy but she has been instructed to hold off on initiation secondary to overall clinical improvement and her history of antibiotic intolerance. She has been instructed to contact the clinic with any noted changes/deterioration and the wound, erythema, edema and/or pain. She will follow-up next week. She continues to smoke and her glucose levels remain elevated >250; she admits to compliance with offloading shoe 03/01/18 on evaluation today patient appears to be doing fairly well in regard to her left first toe ulcer. She has been tolerating the dressing changes with the [facility]  Dressing without complication and overall this has definitely showed signs of improvement according to records as well is what the patient tells me today. I'm very pleased in that regard. She is having no pain today 03/08/18 She is here for follow up evaluation of [name] left great toe ulcer. She remains non-compliant with glucose control and smoking cessation; glucose levels consistently >200. She states that she got new shoe inserts/peg assist. She admits to compliance with offloading. Since my last evaluation there is significant improvement. We will switch to prisma at this time and she will follow up next week. She is noted to be tachycardic at this appointment, heart rate 120s; she has [name] history of heart rate 70-130 according to our records. She admits to extreme agitation r/t personal issues; she was advised to monitor her heartrate and contact her physician if it does not return to [name] more normal range (<100). She takes cardizem twice daily. 03/15/18-She is here in follow-up evaluation for left great toe ulcer. She remains noncompliant with glucose control and smoking cessation. She admits to compliance with  wearing offloading shoe. The ulcer is improved/stable and we will continue with the same treatment plan and she will follow-up next week 03/22/18-She is here for evaluation for left great toe ulcer. There continues to be significant improvement despite recurrent hyperglycemia (over 500 yesterday) and she continues to smoke. She has been compliant with offloading and we will continue with same treatment plan and she will follow-up next week. 03/29/18-She is here for evaluation for left great toe ulcer. Despite continuing to smoke and uncontrolled diabetes she continues to improve. She is compliant with offloading shoe. We will continue with the same treatment plan and she will follow-up next week 04/05/18- She is here in follow up evaluation for [name] left great toe ulcer; she presents with small pustule to  left fifth toe (resembles ant bite). She admits to compliance with wearing offloading shoe; continues to smoke or have uncontrolled blood glucose control. There is more callus than usual with evidence of bleeding; she denies known trauma. 04/12/18-She is here for evaluation of left great toe ulcer. Despite noncompliance with glycemic control and smoking she continues to make improvement. She continues to wear offloading shoe. The pustule, that was identified last week, to the left fifth toe is resolved. She will follow-up in 2 weeks 05/03/18-she is seen in follow-up evaluation for [name] left great toe ulcer. She is compliant with offloading, otherwise noncompliant with glycemic control and smoking. She has plateaued and there is minimal improvement noted. We will transition to [facility], replaced the insert to her surgical shoe and she will follow-up in one week 05/10/18- She is here in follow up evaluation for [name] left great toe ulcer. It appears stable despite measurement change. We will continue with same treatment plan and follow up next week. 05/24/18-She is seen in follow-up evaluation for [name] left great toe ulcer. She remains compliant with offloading, has made significant improvement in her diet, decreasing the amount of sugar/soda. She said her recent A1c was 10.9 which is lower than. She did see [name] diabetic nutritionist/educator yesterday. She continues to smoke. We will continue with the same treatment plan and she'll follow-up next week. 05/31/18- She is seen in follow-up evaluation for left great toe ulcer. She continues to remain compliant with offloading, continues to make improvement in her diet, increasing her water and decreasing the amount of sugar/soda. She does continue to smoke with no desire to quit. We will apply Prisma to the depth and Hydrofera Blue over. We have not received insurance authorization for oasis. She will follow up next week. 06/07/18-She is seen in follow-up evaluation  for left great toe ulcer. It has stalled according to today's measurements although base appears stable. She says she saw [name] diabetic educator yesterday; her average blood sugars are less than 300 which is an improvement for her. She continues to smoke and states "that's my next step" She continues with water over soda. We will order for xray, culture and reinstate ace wrap compression prior to placing apligraf for next week. She is voicing no complaints or concerns. Her dressing will change to iodoflex over the next week in preparation for apligraf. 06/14/18-She is seen in follow-up evaluation for left great toe ulcer. Plain film x-ray performed last week was negative for osteomyelitis. Wound culture obtained last week grew strep B and OSSA; she is initiated on keflex and cefdinir today; there is erythema to the toe which could be from ace wrap compression, she has [name] history of wrapping too tight and has has been encouraged  to maintain ace wraps that we place today. We will hold off on application of apligraf today, will apply next week after antibiotic therapy has been initiated. She admits today that she has resumed taking [name] shower with her foot/toe submerged in water, she has been reminded to keep foot/toe out of the bath water. She will be seen in follow up next week 06/21/18-she is seen in follow-up evaluation for left great toe ulcer. She is tolerating antibiotic therapy with no GI disturbance. The wound is stable. Apligraf was applied today. She has been decreasing her smoking, only had 4 cigarettes yesterday and 1 today. She continues being more compliant in diabetic diet. She will follow-up next week for evaluation of site, if stable will remove at 2 weeks. 06/28/18- She is here in follow up evalution. Apligraf was placed last week, she states the dressing fell off on Tuesday and she was dressing with hydrofera blue. She is healed and will be discharged from the clinic today. She has been  instructed to continue with smoking cessation, continue monitoring glucose levels, offloading for an additional 4 weeks and continue with hydrofera blue for additional two weeks for any possible microscopic opening. HARMONIE, VERRASTRO [name] ([id number]) [id number] Page 11 of 18 Readmission: 08/07/18 on evaluation today patient presents for reevaluation concerning the ulcer of her right great toe. She was previously discharged on 06/28/18 healed. Nonetheless she states that this began to show signs of drainage she subsequently went to her primary care provider. Subsequently an x-ray was performed on 08/01/18 which was negative. The patient was also placed on antibiotics at that time. Fortunately they should have been effective for the infection. Nonetheless she's been experiencing some improvement but still has [name] lot of drainage coming from the wound itself. 08/14/18 on evaluation today patient's wound actually does show signs of improvement in regard to the erythema at this point. She has completed the antibiotics. With that being said we did discuss the possibility of placing her in [name] total contact cast as of today although I think that I may want to give this just [name] little bit more time to ensure nothing recurrence as far as her infection is concerned. I do not want to put in the cast and risk infection at that time if things are not completely resolved. With that being said she is gonna require some debridement today. 08/21/18 on evaluation today patient actually appears to be doing okay in regard to her toe ulcer. She's been tolerating the dressing changes without complication. With that being said it does appear that she is ready and in fact I think it's appropriate for [place] to go ahead and initiate the total contact cast today. Nonetheless she will require some sharp debridement to prepare the wound for application. Overall I feel like things have been progressing well but we do  need to do something to get this to close more readily. 08/24/18 patient seen today for reevaluation after having had the total contact cast applied on Tuesday. She seems to have done very well the wound appears to be doing great and overall I'm pleased with the progress that she's made. There were no abnormal areas of rubbing from the cast on her lower extremity. 08/30/18 on evaluation today patient actually appears to be completely healed in regard to her plantar toe ulcer. She tells me at this point she's been having [name] lot of issues with the cast. She almost fell [name] couple of times the state shall the step of her  dog [name] couple times as well. This is been [name] very frustrating process for her other nonetheless she has completely healed the wound which is excellent news. Overall there does not appear to be the evidence of infection at this time which is great news. 09/11/18 evaluation today patient presents for follow-up concerning her great toe ulcer on the left which has unfortunately reopened since I last saw her which was only [name] couple of weeks ago. Unfortunately she was not able to get in to get the shoe and potentially the AFO that's gonna be necessary due to her left foot drop. She continues with offloading shoe but this is not enough to prevent her from reopening it appears. When we last had her in the total contact cast she did well from [name] healing standpoint but unfortunately the wound reopened as soon as she came out of the cast within just [name] couple of weeks. 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 This is also part of the reason why she falls on [name] regular basis. I just do not believe that is gonna be safe for [place] to reinitiate the total contact cast as last time we had this on she fell 3 times one  week which is definitely not normal for her. 09/18/18 upon evaluation today the patient actually appears to be doing about the same in regard to her toe ulcer. She did not contact [employer] as I asked her to even though I had given her the prescription. In fact she actually states that she has no idea where the prescription is. She did apparently call [employer] and they told her that all she needed to do was bring the prescription in order to be able to be seen and work on getting the AFO for her left foot. With all that being said she still does not have an appointment and I'm not sure were things stand that regard. I will give her [name] new prescription today in order to contact them to get this set up. 09/25/18 on evaluation today patient actually appears to be doing about the same in regard to her toes ulcer. She does have [name] small areas which seems to have [name] lot of callous buildup around the edge of the wound which is going to need sharp debridement today. She still is waiting to be scheduled for evaluation with [employer] for possibility of an AFO. She states there supposed to call her tomorrow to get this set up. Unfortunately it does appear that her foot specifically the toe area is showing signs of erythema. There does not appear to be any systemic infection which is in these good news. 10/02/18 on evaluation today patient actually appears to be doing about the same in regard to her toe ulcer. This really has not done too well although it's not significantly larger it's also not significantly smaller. She has been tolerating the dressing changes without complication. She actually has her appointment with [employer] and [place] tomorrow to hopefully be measured for obtaining and AFO splint. I think this would be helpful preventing this from reoccurring. We had contemplated starting the cast this week although to be honest I am reluctant to do that as she's been having nausea, vomiting, and seizure activity  over the past three days. She  has [name] history of seizures and have been told is nothing that can be done for these. With that being said I do believe that along with the seizures have the nausea vomiting which upon further questioning doesn't seem to be the normal for her and makes me concerned for the possibility of infection or something else going on. I discussed this with the patient and her mother during the office visit today. I do not feel the wound is effective but maybe something else. The responses this was "this just happens to her at times and we don't know why". They did not seem to be interested in going to the hospital to have this checked out further. 10/09/18 on evaluation today patient presents for follow-up concerning her ongoing toe ulcer. She has been tolerating the dressing changes without complication. Fortunately there does not appear to be any evidence of infection which is great news however I do think that the patient would benefit from going ahead for with the total contact cast. She's actually in [name] wheelchair today she tells me that she will use her walker if we initiate the cast. I was very specific about the fact that if we were gonna do the cast I wanted to make sure that she was using the walker in order to prevent any falls. She tells me she does not have stairs that she has to traverse on [name] regular basis at her home. She has not had any seizures since last week again that something that happens to her often she tells me she did talk to [employer] and they said that it may take up to three weeks to get the brace approved for her. Hopefully that will not take that long but nonetheless in the meantime I do think the cast could be of benefit. 10/12/18 on evaluation today patient appears to be doing rather well in regard to her toe ulcer. It's just been [name] few days and already this is significantly improved both as far as overall appearance and size. Fortunately there's no sign  of infection. She is here for her first obligatory cast change. 10/19/18 Seen today for follow up and management of left great toe ulcer. Wound continues to show improvement. Noted small open area with seroussang drainage with palpation. Denies any increased pain or recent fevers during visit. She will continue calcium alginate with offloading shoe. Denies any questions or concerns during visit. 10/26/18 on evaluation today patient appears to be doing about the same as when I last saw her in regard to her wound bed. Fortunately there does not appear to be any signs of infection. Unfortunately she continues to have [name] breakdown in regard to the toe region any time that she is not in the cast. It takes almost no time at all for this to happen. Nonetheless she still has not heard anything from the brace being made by [employer] as to when exactly this will be available to her. Fortunately there is no signs of infection at this time. 10/30/18 on evaluation today patient presents for application of the total contact cast as we just received him this morning. Fortunately we are gonna be able to apply this to her today which is great news. She continues to have no significant pain which is good news. Overall I do feel like things have been improving while she was the cast is when she doesn't have [name] cast that things get worse. She still has not really heard anything from [employer] regarding her brace.  11/02/18 upon evaluation today patient's wound already appears to be doing significantly better which is good news. Fortunately there does not appear to be any signs of infection also good news. Overall I do think the total contact cast as before is helping to heal this area unfortunately it's just not gonna likely keep the area closed and healed without her getting her brace at least. Again the foot drop is [name] significant issue for her. 11/09/18 on evaluation today patient appears to be doing excellent in regard to her toe  ulcer which in fact is completely healed. Fortunately we finally got the situation squared away with the paperwork which was needed to proceed with getting her brace approved by Medicaid. I have filled that out unfortunately that information has been sent to the orthopedic office that I worked at 2 1/2 years ago and not tired Current wound care measures. Fortunately she seems to be doing very well at this time. 11/23/18 on evaluation today patient appears to be doing More Poorly Compared to Last Time I Saw Her. At [facility] She Had Completely Healed. Currently she is continuing to have issues with reopening. She states that she just found out that the brace was approved through Medicaid now she just has to go get measured in order to have this fitted for her and then made. Subsequently she does not have an appointment for this yet that is going to complicate things we obviously cannot put her back in the cast if we do not have everything measured because they're not gonna be able to measure her foot while she is in the cast. 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 Obviously [name], [name] ([id number]) [id number] Page 12 of 18 this is unfortunate and does have me somewhat worried as well. 11/30/18 on evaluation today patient's toe ulcer actually appears to be doing fairly well. The good news is she will be getting her brace in the shoes next week on Wednesday. Hopefully we will be able to get this to heal without having to go back in the cast however she may need the cast in order to get the wound completely heal and then go from there. Fortunately there's no signs of infection at this time. 12/07/18 on evaluation today patient fortunately did receive her brace and she states she could tell this definitely makes her walk better. With that being said she's been having issues  with her toe where she noticed yesterday there was [name] lot of tissue that was loosing off this appears to be much larger than what it was previous. She also states that her leg has been read putting much across the top of her foot just about the ankle although this seems to be receiving somewhat. The total area is still red and appears to be someone infected as best I can tell. She is previously taken Bactrim and that may be [name] good option for her today as well. We are gonna see what I wound culture shows as well and I think that this is definitely appropriate. With that being said outside of the culture I still need to initiate something in the interim and that's what I'm gonna go ahead and select Bactrim is [name] good option for her. 12/14/18 on evaluation today patient appears to be doing better in regard to her left great toe ulcer as compared to last week's evaluation.  There's still some erythema although this is significantly improved which is excellent news. Overall I do believe that she is making good progress is still gonna take some time before she is where I would like her to be from the standpoint of being able to place her back into the total contact cast. Hopefully we will be where we need to be by next week. 12/21/18 on evaluation today patient actually appears to be doing poorly in regard to her toe ulcer. She's been tolerating the dressing changes without complication. Fortunately there's no signs of systemic infection although she does have [name] lot of drainage from the toe ulcer and this does seem to be causing some issues at this point. She does have erythema on the distal portion of her toe that appears to be likely cellulitis. 12/28/18 on evaluation today patient actually appears to be doing [name] little better in my pinion in regard to her toe ulcer. With that being said she still does have some evidence of infection at this time and for her culture she had both E. coli as well as enterococcus as  organisms noted on evaluation. For that reason I think that though the Keflex likely has treated the E. coli rather well this has really done nothing for the enterococcus. We are going to have to initiate treatment for this specifically. 01/04/19 on evaluation today patient's toe actually appears to be doing better from the standpoint of infection. She currently would like to see about putting the cash back on I think that this is appropriate as long as she takes care of it and keeps it from getting wet. She is gonna have some drainage we can definitely pass this up with Drawtex and alginate to try to prevent as much drainage as possible from causing the problems. With that being said I do want to at least try her with the cast between now and Tuesday. If there any issues we can't continue to use it then I will discontinue the use of the cast at that point. 01/08/19 on evaluation today patient actually appears to be doing very well as far as her foot ulcer specifically the great toe on the left is concerned. She did have an area of rubbing on the medial aspect of her left ankle which again is from the cast. Fortunately there's no signs of infection at this point in this appears to be [name] very slight skin breakdown. The patient tells me she felt it rubbing but didn't think it was that bad. Fortunately there is no signs of active infection at this time which is good news. No fevers, chills, nausea, or vomiting noted at this time. 01/15/19 on evaluation today patient actually appears to be doing well in regard to her toe ulcer. Again as previous she seems to do well and she has the cast on which indicates to me that during the time she doesn't have [name] cast on she's putting way too much pressure on this region. Obviously I think that's gonna be an issue as with the current national emergency concerning the Covid-19 Virus it has been recommended that we discontinue the use of total contact casting by the chief  medical officer of our company, Dr. Maurine Minister. The reasoning is that if [name] patient becomes sick and cannot come into have the cast removed they could not just leave this on for an additional two weeks. Obviously the hospitals also do not want to receive patient's who are sick into the emergency department to  potentially contaminate the region and spread the Covid-19 Virus among other sick individuals within the hospital system. Therefore at this point we are suspending the use of total contact cast until the current emergency subsides. This was all discussed with the patient today as well. 01/22/19 on evaluation today patient's wound on her left great toe appears to be doing slightly worse than previously noted last week. She tells me that she has been on this quite [name] bit in fact she tells me she's been awake for 38 straight hours. This is due to the fact that she's having to care for grandparents because nobody else will. She has been taking care of them for five the last seven days since I've seen her they both have dementia his is from [name] stroke and her grandmother's was progressive. Nonetheless she states even her mom who knows her condition and situation has only help two of those days to take care of them she's been taking care of the rest. Fortunately there does not appear to be any signs of active infection in regard to her toe at this point although obviously it doesn't look as good as it did previous. I think this is directly related to her not taking off the pressure and friction by way of taking things easy. Though I completely understand what's going on. 01/29/19 on evaluation today patient's tools are actually appears to be showing some signs of improvement today compared to last week's evaluation as far as not necessarily the overall size of the wound but the fact that she has some new skin growth in between the two ends of the wound opening. Overall I feel like she has done well she states that  she had [name] family member give her what sounds to be [name] CAM walker boot which has been helpful as well. 02/05/19 on evaluation today patient's wound bed actually appears to be doing significantly better in regard to her overall appearance of the size of the wound. With that being said she is still having an issue with offloading efficiently enough to get this to close. Apparently there is some signs of infection at this point as well unfortunately. Previously she's done well of Augmentin I really do not see anything that needs to be culture currently but there theme and cellulitis of the foot that I'm seeing I'm gonna go ahead and place her on an antibiotic today to try to help clear this up. 02/12/2019 on evaluation today patient actually appears to be doing poorly in regard to her overall wound status. She tells me she has been using her offloading shoe but actually comes in today wearing her tennis shoe with the AFO brace. Again as I previously discussed with her this is really not sufficient to allow the area to heal appropriately. Nonetheless she continues to be somewhat noncompliant and I do wonder based on what she has told my nurse in the past as to whether or not [name] good portion of this noncompliance may be recreational drug and alcohol related. She has had [name] history of heroin overdose and this was fairly recently in the past couple of months that have been seeing her. Nonetheless overall I feel like her wound looks significantly worse today compared to what it was previous. She still has significant erythema despite the Augmentin I am not sure that this is an appropriate medication for her infection I am also concerned that the infection may have gone down into her bone. 02/19/19 on evaluation today patient  actually appears to be doing about the same in regard to her toe ulcer. Unfortunately she continues to show signs of bone exposure and infection at this point. There does not appear to be any  evidence of worsening of the infection but I'm also not really sure that it's getting significantly better. She is on the Augmentin which should be sufficient for the Staphylococcus aureus infection that she has at this point. With that being said she may need IV antibiotics to more appropriately treat this. We did have [name] discussion today about hyperbaric option therapy. 02/28/19 on evaluation today patient actually appears to be doing much worse in regard to the wound on her left great toe as compared to even my previous evaluation last week. Unfortunately this seems to be training in [name] pretty poor direction. Her toe was actually now starting to angle laterally and I can actually see the entire joint area of the proximal portion of the digit where is the distal portion of the digit again is no longer even in contact with the joint line. Unfortunately there's [name] lot more necrotic tissue around the edge and the toe appears to be showing signs of becoming gangrenous in my pinion. I'm very concerned about were things stand at this point. She did see infectious disease and they are planning to send in [name] prescription for Sivextro for her and apparently this has been approved. With that being said I don't think she should avoid taking this but at the same time I'm not sure that it's gonna be sufficient to save her toe at this point. She tells me that she still having to care for grandparents which I think is putting quite [name] bit of strain on her foot and specifically the total area and has caused this to break down even to [name] greater degree than would've otherwise been expected. 03/05/19 on evaluation today patient actually appears to be doing quite well in regard to her toe all things considering. She still has bone exposed but there appears to be much less your thing on overall the appearance of the wound and the toe itself is dramatically improved. She still does have some issues currently obviously with  infection she did see vascular as well and there concerned that her blood flow to the toad. For that reason they are setting up for an angiogram next week. 03/14/19 on evaluation today patient appears to be doing very poor in regard to her toe and specifically in regard to the ulceration and the fact that she's starting to notice the toe was leaning even more towards the lateral aspect and the complete joint is visible on the proximal aspect of the joint. Nonetheless she's also noted [name] significant odor and the tip of the toe is turning more dark and necrotic appearing. Overall I think she is getting worse not better as far as [name], [name] ([id number]) [id number] Page 13 of 18 this is concerned. For that reason I am recommending at this point that she likely needs to be seen for likely amputation. READMISSION 03/19/2021 This is [name] patient that we cared for in this clinic for [name] prolonged period of time in 2019 and 2020 with [name] left foot and left first toe wound. 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 Excell Seltzer. In December 2021 she had an ulcer on her right great toe as well as the  fourth and fifth toes. 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 She also had an angiogram at that time and underwent angioplasty of the right anterior tibial artery. In any case she claims that the wound on the right foot is closed I did not look at this today which was probably an oversight although I think that should be done next week. 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 According to Dr. Bernette Redbird last review that she was out of the area being cared for by another physician but recently came back to his attention. 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 [name] culture of this area  showed E. coli apparently before she came back to see Dr. Excell Seltzer she was supposed to be receiving antibiotics but she did not really take them. Nor is she offloading this area at all. Finally her last hemoglobin A1c listed in epic was in March 2022 at 14.1 she says things are [name] lot better since then although I am not sure. 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 She had this wound at the time. During this admission she had E. coli sepsis [name] TEE was negative. 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 She is still [name] heavy cigarette smoker. She has [name] history of bipolar disorder seizure disorder ABI in our clinic was 1.05 6/1; left midfoot in the setting of [name] TMA done previously. Round circular wound with [name] "knuckle" of protruding tissue. The problem is that the knuckle was not attached to any of the surrounding granulation and this probed proximally widely I removed [name] large portion of this tissue. This wound goes with considerable undermining laterally. I do not feel any bone there was no purulence but this is [name] deep wound. 6/8; in spite of the debridement I did last week. She arrives with [name] wound looking exactly the same. [name] protruding "knuckle" of tissue nonadherent to most of the surrounding tissue. There is considerable depth around this from 6-12 o'clock at 2.7 cm and undermining of 1 cm. This does not look overtly infected and the x- ray I did last week was negative for any osseous abnormalities. We have been using silver collagen 6/15; deep tissue culture I did last week showed moderate staph aureus and moderate Pseudomonas. This will definitely require  prolonged antibiotic therapy. The pathology on the protuberant area was negative for malignancy fungus etc. 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 We have been using silver collagen. I am going to be prescribing Levaquin for 2 weeks. Her CT scan of the foot is down for 7/5 6/22; CT scan of the foot on 7 5. She says she has hardware in the left leg from her previous fracture. She is on the Levaquin for the deep tissue culture I did that showed methicillin sensitive staph aureus and Pseudomonas. I gave her [name] 2-week supply and she will have another week. She arrives in clinic today with the same protuberant tissue however this is nonadherent to the tissue surrounding it. I am really at [name] loss to explain this unless there is underlying deep tissue infection  6/29; patient presents for 1 week follow-up. She has been using collagen to the wound bed. She reports taking her antibiotics as prescribed.She has no complaints or issues today. She denies signs of infection. 7/6; patient presents for one week followup. She has been using collagen to the wound bed. She states she is taking Levaquin however at times she is not able to keep it down. She denies signs of infection. 7/13; patient presents for 1 week follow-up. She has been using silver alginate to the wound bed. She still has nausea when taking her antibiotics. She denies signs of infection. 7/20; patient presents for 1 week follow-up. She has been using silver alginate with gentamicin cream to the wound bed. She denies any issues and has no complaints today. She denies signs of infection. 7/27; patient presents for 1 week follow-up. She continues to use silver alginate with gentamicin cream to the wound bed. She reports starting her antibiotics. She has no issues or complaints. Overall she reports stability to the wound. 8/3; patient presents for 1 week follow-up. She has been using silver  alginate with gentamicin cream to the wound bed. She reports completing all antibiotics. She has no issues or complaints today. She denies signs of infection. 8/17; patient presents for 2-week follow-up. He is to use silver alginate to the wound bed. She has no issues or complaints today. She denies signs of infection. She reports her pain has improved in her foot since last clinic visit 8/24; patient presents for 1 week follow-up. She continues to use silver alginate to the wound bed. She has no issues or complaints. She denies signs of infection. Pain is stable. 9/7; patient presents for follow-up. She missed her last week appointment due to feeling ill. She continues to use silver alginate. She has [name] new wound to the right lower extremity that is covered in eschar. She states It occurred over the past week and has no idea how it started. She currently denies signs of infection. 9/14; patient presents for follow-up. T the left foot wound she has been using gentamicin cream and silver alginate. T the right lower extremity wound she has o o been keeping this covered and has not obtain Santyl. 9/21; patient presents for follow-up. She reports using gentamicin cream and silver alginate to the left foot and Santyl to the right lower extremity wound. She has no issues or complaints today. She denies signs of infection. 9/28; patient presents for follow-up. She reports [name] new wound to her right heel. She states this occurred [name] few days ago and is progressively gotten worse. She has been trying to clean the area with [name] Q-tip and Santyl. She reports stability in the other 2 wounds. She has been using gentamicin cream and silver alginate to the left foot and Santyl to the right lower extremity wound. 10/12; patient presents for follow-up. She reports improvement to the wound beds. She is seeing vein and vascular to discuss the potential of [name] left BKA. She states they are going to do an arteriogram. She  continues to use silver alginate with dressing changes to her wounds. 11/2; patient presents for follow-up. She states she has not been doing dressing changes to the wound beds. She states she is not able to offload the areas. She reports chronic pain to her left foot wound. 11/9; patient presents for follow-up. She came in with only socks on. She states she forgot to put on shoes. It is unclear if she is doing any dressing changes.  She currently denies systemic signs of infection. 11/16; patient presents for follow-up. She came again only with socks on. She states she does not wear shoes ever. It is unclear if she does dressing changes. She currently denies systemic signs of infection. 11/23; patient presents for follow-up. She wore her shoes today. It still unclear exactly what dressing she is using for each wound but she did states she obtained Dakin's solution and has been using this to the left foot wound. She currently denies signs of infection. 11/30; patient presents for follow-up. She has no issues or complaints today. She currently denies signs of infection. 12/7; patient presents for follow-up. She has no issues or complaints today. She has been using Hydrofera Blue to the right heel wound and Dakin solution to the left foot wound. Her right anterior leg wound is healed. She currently denies signs of infection. 12/14; patient presents for follow-up. She has been using Hydrofera Blue to the right heel and Dakin's to the left foot wounds. She has no issues or complaints today. She denies signs of infection. 12/21; patient presents for follow-up. She reports using Hydrofera Blue to the right heel and Dakin's to the left foot wound. She denies signs of infection. 12/28; patient presents for follow-up. She continues to use Dakin's to the left foot wound and Hydrofera Blue to the right heel wound. She denies signs of infection. KARYNA, BESSLER [name] ([id number])  [id number] Page 14 of 18 1/4; patient presents for follow-up. She has no issues or complaints today. She denies signs of infection. 1/11; patient presents for follow-up. It is unclear if she has been dressing these wounds over the past week. She currently denies signs of infection. 1/18; patient presents for follow-up. She states she has been using Dakin's wet-to-dry dressings to the left foot. She has been using Hydrofera Blue to the right foot foot wound. She states that the anterior right leg wound has reopened and draining serous fluid. She denies signs of infection. 1/25; patient presents for follow-up. She has no issues or complaints today. 2/1; patient presents for follow-up. She has no issues or complaints today. She denies signs of infection. 2/8; patient presents for follow-up. She has lost her surgical shoes. She did not have [name] dressing to the right heel wound. She currently denies signs of infection. 2/15; patient presents for follow-up. She reports more pain to the right heel today. She denies purulent drainage Or fever/chills 2/22; patient presents for follow-up. She reports taking clindamycin over the past week. She states that she continues to have pain to her right heel. She reports purulent drainage. Readmission 03/16/2022 Ms. Cobie Leidner is [name] 47 year old female with [name] past medical history of type 2 diabetes, osteomyelitis to her feet, chronic systolic heart failure and bipolar disorder that presents to the clinic for bilateral feet wounds and right lower extremity wound. She was last seen in our clinic on 12/15/2021. At that time she had purulent drainage coming out of her right plantar foot and I recommended she go to the ED. She states she went to [facility] and has been there for the past 3 months. I cannot see the records. She states she had OR debridement and was on several weeks of IV antibiotics while inpatient. Since discharge she  has not been taking care of the wound beds. She had nothing on her feet other than socks today. She currently denies signs of infection. 5/31; patient presents for follow-up. She has been using Dakin's wet-to-dry dressings to the  wound beds on her feet bilaterally and antibiotic ointment to the right anterior leg wound. She had [name] wound culture done at last clinic visit that showed moderate Pseudomonas aeruginosa sensitive to ciprofloxacin. She currently denies systemic signs of infection. 6/14; patient presents for follow-up. She received Keystone 5 days ago and has been using this on the wound beds. She states that last week she had to go to the hospital because she had increased warmth and erythema to the right foot. She was started on 2 oral antibiotics. She states she has been taking these. She currently denies systemic signs of infection. She has no issues or complaints today. 6/21; patient presents for follow-up. She states she has been using Keystone antibiotics to the wound beds. She has no issues or complaints today. She denies signs of infection. 6/28; patient presents for follow-up. She has been using Keystone antibiotics to the wound beds. She has no issues or complaints today. 7/12; patient presents for follow-up. Has been using Keystone antibiotics to the wound beds with calcium alginate. She has no issues or complaints today. She never followed up with her orthopedic surgeon who did the OR debridement to the right foot. We discussed the total contact cast for the left foot and patient would like to do this next week. 7/19; patient presents for follow-up. She has been using Keystone antibiotics with calcium alginate to the wound beds. She has no issues or complaints today. Patient is in agreement to do the total contact cast of the left foot today. She knows to return later this week for the obligatory cast change. 05-13-2022 upon evaluation today patient's wound which she has the cast of  the left leg actually appears to be doing significantly better. Fortunately I do not see any signs of active infection locally or systemically which is great news and overall I am extremely pleased with where we stand currently. 7/26; patient presents for follow-up. She has [name] cast in place for the past week. She states it irritated her shin. Other than that she tolerated the cast well. She states she would like [name] break for 1 week from the cast. We have been using Keystone antibiotic and Aquacel to both wound beds. She denies signs of infection. 8/2; patient presents for follow-up. She has been using Keystone and Aquacel to the wound beds. She denies any issues and has no complaints. She is agreeable to have the cast placed today for the left leg. 06-03-2022 upon evaluation today patient appears to be doing well with regard to her wound she saw some signs of improvement which is great news. Fortunately I do not see any evidence of active infection locally or systemically at this time which is great news. No fevers, chills, nausea, vomiting, or diarrhea. 8/16; patient presents for follow-up. She has no issues or complaints today. We have been using Keystone and Aquacel to the wound beds. The left lower extremity is in [name] total contact cast. She is tolerated this well. 8/23; patient presents for follow-up. She has had the total contact cast on the left leg for the past week. Unfortunately this has rubbed and broken down the skin to the medial foot. She currently denies signs of infection. She has been using Keystone antibiotic to the right plantar foot wound. 8/30; patient presents for follow-up. We have held off on the total contact cast for the left leg for the past week. Her wound on the left foot has improved and the previous surrounding breakdown of skin has  epithelialized. She has been using Keystone antibiotic to both wound beds. She has no issues or complaints today. She denies signs of  infection. 9/6; patient presents for follow-up. She has ordered her's Keystone antibiotic and this is arriving this week. She has been doing Dakin's wet-to-dry dressings to the wound beds. She denies signs of infection. She is agreeable to the total contact cast today. 9/13; patient presents for follow-up. She states that the cast caused her left leg shin to be sore. She would like to take [name] break from the cast this week. She has been using Keystone antibiotic to the right plantar foot wound. She denies signs of infection. 9/20; patient presents for follow-up. She has been using Keystone antibiotics to the wound beds with calcium alginate to the right foot wound and Hydrofera Blue to the left foot wound. She is agreeable to having the cast placed today. She has been approved for Apligraf and we will order this for next clinic visit. 9/27; patient presents for follow-up. We have been using Keystone antibiotic with Hydrofera Blue to the left foot wound under [name] total contact cast. T the right o foot wound she has been using Keystone antibiotic and calcium alginate. She declines [name] total contact cast today. Apligraf is available for placement and she would like to proceed with this. 07-28-2022 upon evaluation today patient appears to be doing well currently in regard to her wound. She is actually showing signs of significant improvement which is great news. Fortunately I do not see any evidence of active infection locally nor systemically at this time. She has been seeing Dr. Mikey Bussing and to be honest has been doing very well with the cast. Subsequently she comes in today with [name] cast on and we did reapply that today as well. She did not really want to she try to talk me out of that but I explained that if she wanted to heal this is really the right way to go. Patient voiced understanding. In regard to her right foot this is actually [name] lot better compared to the last time I saw her which is also great  news. 10/11; patient presents for follow-up. Apligraf and the total contact cast was placed to the left leg at last clinic visit. She states that her right foot wound had burning pain to it with the placement of Apligraf to this area. She has been doing [place] over this area. She denies signs of infection including increased [place], Jerzee [name] ([id number]) [id number] Page 15 of 18 warmth, erythema or purulent drainage. 11/1; 3-week follow-up. The patient fortunately did not have [name] total contact cast or an Apligraf and on the left foot. She has been using Keystone ABD pads and kerlix and her own running shoes She arrives in clinic today with thick callus and [name] very poor surface on the left foot on the right nonviable skin subcutaneous tissue and [name] deep probing hole. 11/15; patient missed her last clinic appointment. She states she has not been dressing the wound beds for the past 2 weeks. She states that at she had [name] new roommate but is now going back to live with her mother. Apparently its been [name] distracting 2 weeks. Patient currently denies signs of infection. 11/22; patient presents for follow-up. She states she has been using Keystone antibiotic and Dakin's wet-to-dry dressings to the wound beds. She is agreeable for cast placement today. We had ordered Apligraf however this has not been received by our facility. 11/29; Patient had [name] total  contact cast placed at last clinic visit and she tolerated this well. We were using silver alginate under the cast. Patient's been using Keystone antibiotic with Aquacel to the right plantar foot wound. She has no issues or complaints today. Apligraf is available for placement today. Patient would like to proceed with this. 12/6; patient presents for follow-up. She had Apligraf placed in standard fashion last clinic visit under the total contact cast to the left lower extremity. She has been using Keystone antibiotic and Aquacel to  the right plantar foot wound. She has no issues or complaints today. 12/13; patient presents for follow-up. She has finished 5 Apligraf placements. Was told she would not qualify for more. We have been doing [name] total contact cast to the left lower extremity. She has been using Keystone antibiotic and Aquacel to the right plantar foot wound. She has no issues or complaints today. 12/20; patient presents for follow-up. We have been using Hydrofera Blue with Keystone antibiotic under [name] total contact cast of the left lower extremity. She reports using Keystone antibiotic and silver alginate to the right heel wound. She has no issues or complaints today. 12/27; patient presents with [name] healthy wound on the left midfoot. We have Apligraf to apply that to that more also using [name] total contact cast. On the right we are using Keystone and silver alginate. She is offloading the right heel with [name] surgical shoealthough by her admission she is on her feet quite [name] bit 1/3; patient presents for follow-up. Apligraf was placed to the wound beds last clinic visit. She was placed in [name] total contact cast to the left lower extremity. She declines [name] total contact cast today. She states that her mother is in the hospital and she cannot adequately get around with the cast on. 1/10; patient presents for follow-up. She declined the total contact cast at last clinic visit. Both wounds have declined in appearance. She states that she has been on her feet and not offloading the wound beds. She currently denies signs of infection. 1/17; patient presents for follow-up. She had the total contact cast along with Apligraf placed last week to the left lower extremity. She tolerated this well. She has been using Aquacel Ag and Keystone antibiotic to the right heel wound. She currently denies signs of infection. She has no issues or complaints today. 1/24; patient presents for follow-up. We have been using Apligraf to the left foot wound  along with [name] total contact cast. She has done well with this. T the right o heel wound she has been using Aquacel Ag and Keystone antibiotic ointment. She has no issues or complaints today. She denies signs of infection. 1/31; patient presents for follow-up. We have been using Apligraf to the left foot wound along with the total contact cast. She continues to do well with this. To the right heel we have been using Aquacel Ag and Keystone antibiotic ointment. She has no issues or complaints today. 12-01-2022 upon evaluation patient is seen today on my schedule due to the fact that she unfortunately was in the hospital yesterday. Her cast needed to come off the only reason she is out of the hospital is due to the fact that they would not take it off at the hospital which is somewhat bewildered reading to me to be perfectly honest. I am not certain why this was but either way she was released and then was placed on my schedule today in order to get this off and reapply  the total contact casting as appropriate. I do not have an Apligraf for her today it was applied last week and today's actually expired yesterday as there was some scheduling conflicts with her being in the hospital. Nonetheless we do not have that for reapplication today but the good news that she is not draining too much and the Apligraf can go for up to 2 weeks so I am going to go ahead and reapply the total contact casting but we are going to leave the Apligraf in place. 2/14; patient presents for follow-up. T the left leg she has had the total contact cast and Apligraf for the past week. She has had no issues with this. T the o o right heel she has been using antibiotic ointment and Aquacel Ag. 2/21; patient presents for follow-up. We have been using Apligraf and [name] total contact cast to the left lower extremity. She is tolerated this well. Unfortunately she is not approved for any more Apligraf per insurance. She has been using  antibiotic ointment and Aquacel Ag to the right foot. She has no issues or complaints today. 2/28; patient presents for follow-up. We have been using Hydrofera Blue and antibiotic ointment under the total contact cast to the left lower extremity. He has been using Aquacel Ag with antibiotic ointment to the right plantar foot. She has no issues or complaints today. 3/6; patient presents for follow-up. She did not obtain her gentamicin ointment. She has been using Aquacel Ag to the right plantar foot wound. We have been using Hydrofera Blue with antibiotic ointment under the total contact cast to the left lower extremity. She has no issues or complaints today. 3/12; patient presents for follow-up. She has been using gentamicin ointment and Aquacel Ag to the right plantar foot wound. We have been using Hydrofera Blue with antibiotic ointment under the total contact cast on the left lower extremity foot wound. She has no issues or complaints today. 3/20; patient presents for follow-up. She has been using gentamicin ointment and Aquacel Ag to the right plantar foot wound. We have been using Hydrofera Blue with antibiotic ointment under the total contact cast to the left lower extremity wound. She has no issues or complaints today. 3/27; patient presents for follow-up. We have been using antibiotic ointment and Aquacel Ag to the right plantar foot wound. We have been using Hydrofera Blue with antibiotic ointment under the total contact cast to the left lower extremity. Both wounds are smaller. 01-24-2023 upon evaluation today patient actually appears to be doing better compared to last time I saw her. It has been several weeks since I last saw her but nonetheless I think the total contact casting is doing [name] good job. Fortunately I do not see any evidence of infection or worsening overall which is great news and in general I do believe that she is moving in the appropriate direction. It has been [name] very long  road but nonetheless I feel like she is nearing the end at least in regard to the left foot and even the right foot looks much better to be perfectly honest. Objective Constitutional Well-nourished and well-hydrated in no acute distress. GILA, LAUF [name] ([id number]) [id number] Page 16 of 18 Vitals Time Taken: 2:00 PM, Height: 69 in, Weight: 178 lbs, BMI: 26.3, Temperature: 98.3 F, Pulse: 71 bpm, Respiratory Rate: 16 breaths/min, Blood Pressure: 140/88 mmHg. Respiratory normal breathing without difficulty. Psychiatric this patient is able to make decisions and demonstrates good insight into disease process. Alert and  Oriented x 3. pleasant and cooperative. General Notes: Upon inspection patient's wound bed actually showed signs of really doing quite well I do not see any need for sharp debridement at this point. I am actually very pleased with where we stand. Integumentary (Hair, Skin) Wound #11 status is Open. Original cause of wound was Surgical Injury. The date acquired was: 12/01/2021. The wound has been in treatment 44 weeks. The wound is located on the Right,Plantar Foot. The wound measures 0.6cm length x 0.4cm width x 0.4cm depth; 0.188cm^2 area and 0.075cm^3 volume. There is Fat Layer (Subcutaneous Tissue) exposed. There is [name] medium amount of serosanguineous drainage noted. The wound margin is distinct with the outline attached to the wound base. There is medium (34-66%) pale granulation within the wound bed. There is [name] medium (34-66%) amount of necrotic tissue within the wound bed. Wound #12 status is Open. Original cause of wound was Pressure Injury. The date acquired was: 03/16/2020. The wound has been in treatment 44 weeks. The wound is located on the Left,Medial,Plantar Foot. The wound measures 0.6cm length x 0.4cm width x 0.1cm depth; 0.188cm^2 area and 0.019cm^3 volume. There is Fat Layer (Subcutaneous Tissue) exposed. There is [name] medium amount of serous  drainage noted. The wound margin is flat and intact. There is small (1- 33%) red, pink granulation within the wound bed. There is [name] large (67-100%) amount of necrotic tissue within the wound bed including Adherent Slough. 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 There was [name] T Contact otal Cast Procedure by Nelida Meuse., PA-C. Post procedure Diagnosis Wound #12: Same as Pre-Procedure Plan Follow-up Appointments: Return Appointment in 1 week. Nurse Visit as needed Bathing/ Shower/ Hygiene: May shower with wound dressing protected with water repellent cover or cast protector. No tub bath. Anesthetic (Use 'Patient Medications' Section for Anesthetic Order Entry): Lidocaine applied to wound bed Edema Control - Lymphedema / Segmental Compressive Device / Other: Elevate, Exercise Daily and Avoid Standing for Long Periods of Time. Elevate legs to the level of the heart and pump ankles as often as possible Elevate leg(s) parallel to the floor when sitting. Off-Loading: T Contact Cast to Left Lower Extremity otal Open toe surgical shoe - Heel off loader - Right foot Other: - keep pressure off of feet. Additional Orders / Instructions: Follow Nutritious Diet and Increase Protein Intake Other: - Stay off of foot, wheelchair only WOUND #11: - Foot Wound Laterality: Plantar, Right Cleanser: Wound Cleanser 1 x Per Day/30 Days Discharge Instructions: Wash your hands with soap and water. Remove old dressing,  discard into plastic bag and place into trash. Cleanse the wound with Wound Cleanser prior to applying [name] clean dressing using gauze sponges, not tissues or cotton balls. Do not scrub or use excessive force. Pat dry using gauze sponges, not tissue or cotton balls. Topical: Gentamicin 1 x Per Day/30 Days Discharge Instructions: Apply as directed by provider. in clinic Prim Dressing: Aquacel Extra Hydrofiber Dressing, 2x2 (in/in) 1 x Per Day/30 Days ary Secondary Dressing: ABD Pad 5x9 (in/in) (Generic) 1 x Per Day/30 Days  Discharge Instructions: Cover with ABD pad JULIANE, GUEST [name] ([id number]) [id number] Page 17 of 18 Secured With: Medipore T - 20M Medipore H Soft Cloth Surgical T ape ape, 2x2 (in/yd) 1 x Per Day/30 Days Secured With: [employer] Sterile or Non-Sterile 6-ply 4.5x4 (yd/yd) 1 x Per Day/30 Days Discharge Instructions: Apply Kerlix as directed WOUND #12: - Foot Wound Laterality: Plantar, Left, Medial Cleanser: Wound Cleanser 1 x Per Week/30 Days Discharge Instructions: Wash your hands with soap and water. Remove old dressing, discard into plastic bag and place into trash. Cleanse the wound with Wound Cleanser prior to applying [name] clean dressing using gauze sponges, not tissues or cotton balls. Do not scrub or use excessive force. Pat dry using gauze sponges, not tissue or cotton balls. Topical: Gentamicin 1 x Per Week/30 Days Discharge Instructions: Apply as directed by provider. in clinic Prim Dressing: Hydrofera Blue Ready Transfer Foam, 2.5x2.5 (in/in) 1 x Per Week/30 Days ary Discharge Instructions: Apply Hydrofera Blue Ready to wound bed as directed Prim Dressing: Silvercel 4 1/4x 4 1/4 (in/in) 1 x Per Week/30 Days ary Discharge Instructions: Apply to periwound Secondary Dressing: ABD Pad 5x9 (in/in) (Generic) 1 x Per Week/30 Days Discharge Instructions: Cover with ABD pad Secured With: Medipore T - 20M Medipore H Soft Cloth Surgical T ape ape,  2x2 (in/yd) 1 x Per Week/30 Days Secured With: [employer] Sterile or Non-Sterile 6-ply 4.5x4 (yd/yd) 1 x Per Week/30 Days Discharge Instructions: Apply Kerlix as directed 1. I am good recommend currently that we actually continue with the total contact cast I did go ahead and reapply that currently to the left foot. 2. Also can recommend that we should continue with the gentamicin per Dr. Neita Garnet previous orders. Were also using the Aquacel for the right foot and were using the [facility] for the left foot. 3. I would suggest that the patient should continue with the total contact casting I did reapply that again today as well. We will see patient back for reevaluation in 1 week here in the clinic. If anything worsens or changes patient will contact our office for additional recommendations. Electronic Signature(s) Signed: 01/26/2023 8:31:14 PM By: Heather Kelp PA-C Entered By: [name] on 01/26/2023 20:31:14 -------------------------------------------------------------------------------- Total Contact Cast Details Patient Name: Date of Service: SUMEYA, YONTZ 01/24/2023 1:45 PM Medical Record Number: [id number] Patient Account Number: [id number] Date of Birth/Sex: Treating RN: 1976/07/31 (47 y.o. [name] Primary Care Provider: Lindwood [name] Other Clinician: Betha [name] Referring Provider: Treating Provider/Extender: [name] in Treatment: [phone] Contact Cast Applied for Wound Assessment: otal Wound #12 Left,Medial,Plantar Foot Performed By: Physician Nelida Meuse., PA-C Post Procedure Diagnosis Same as Pre-procedure Electronic Signature(s) Signed: 01/24/2023 4:53:44 PM By: [name] Signed: 01/27/2023 8:47:01 AM By: Heather Kelp PA-C Entered By: [name] on 01/24/2023 14:23:34 -------------------------------------------------------------------------------- SuperBill Details Patient Name: Date of Service: [name]  [name]. 01/24/2023 Medical Record Number: [id number] Patient Account Number: [id number] Date of Birth/Sex: Treating RN: 03-08-1976 (47 y.o. [name] Primary Care Provider: Lindwood [name] Other Clinician: Betha [name] Referring Provider: Treating Provider/Extender: [name] Weeks in Treatment: 44 Diagnosis Coding ICD-10 Codes Code Description [phone] Non-pressure chronic ulcer of other part of left foot with other specified severity 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.621 Type 2 diabetes mellitus with foot ulcer Cosma, Patience [name] ([id number]) [id number] Page 18 of 18 E11.42 Type 2 diabetes mellitus  with diabetic polyneuropathy L97.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 of skin Facility Procedures : CPT4 Code: [id number] Description: [phone] - APPLY TOTAL CONTACT LEG CAST ICD-10 Diagnosis Description 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 Modifier: ity Quantity: 1 Physician Procedures : CPT4 Code Description Modifier [id number] - WC PHYS LEVEL 4 - EST PT 25 ICD-10 Diagnosis Description 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ity L97.512 Non-pressure chronic ulcer of other part of  right foot with fat layer exposed E11.621 Type 2 diabetes mellitus with foot ulcer E11.42 Type 2 diabetes mellitus with diabetic polyneuropathy Quantity: 1 Electronic Signature(s) Signed: 01/26/2023 8:31:34 PM By: Heather Kelp PA-C Previous Signature: 01/24/2023 4:53:44 PM Version By: [name] Entered By: [name] on 01/26/2023 20:31:33

## 2023-01-25 NOTE — Progress Notes (Signed)
JALAIAH, DOLENCE A ([id number]) [id number] Page 1 of 9 Visit Report for 01/24/2023 Arrival Information Details Patient Name: Date of Service: KYLY, BAERT 01/24/2023 1:45 PM Medical Record Number: [id number] Patient Account Number: [id number] Date of Birth/Sex: Treating RN: 09-10-1976 (47 y.o. Drema Pry Primary Care Maddyson Keil: Raelene Bott Other Clinician: Massie Kluver Referring Monnie Anspach: Treating Janalyn Higby/Extender: Mariana Single in Treatment: 64 Visit Information History Since Last Visit All ordered tests and consults were completed: No Patient Arrived: Wheel Chair Added or deleted any medications: No Arrival Time: 13:54 Any new allergies or adverse reactions: No Transfer Assistance: EasyPivot Patient Lift Had a fall or experienced change in No Patient Identification Verified: Yes activities of daily living that may affect Secondary Verification Process Completed: Yes risk of falls: Patient Requires Transmission-Based Precautions: No Signs or symptoms of abuse/neglect since last visito No Patient Has Alerts: No Hospitalized since last visit: No Implantable device outside of the clinic excluding No cellular tissue based products placed in the center since last visit: Has Dressing in Place as Prescribed: Yes Has Footwear/Offloading in Place as Prescribed: Yes Left: T Contact Cast otal Right: Wedge Shoe Pain Present Now: Yes Electronic Signature(s) Signed: 01/24/2023 4:53:44 PM By: Massie Kluver Entered By: Massie Kluver on 01/24/2023 13:59:43 -------------------------------------------------------------------------------- Clinic Level of Care Assessment Details Patient Name: Date of Service: MONTRICE, BETTINI 01/24/2023 1:45 PM Medical Record Number: [id number] Patient Account Number: [id number] Date of Birth/Sex: Treating RN: 1975-11-26 (47 y.o. Drema Pry Primary Care Abbi Mancini: Raelene Bott Other Clinician:  Massie Kluver Referring Ozan Maclay: Treating Manjot Hinks/Extender: Mariana Single in Treatment: 44 Clinic Level of Care Assessment Items TOOL 1 Quantity Score []  - 0 Use when EandM and Procedure is performed on INITIAL visit ASSESSMENTS - Nursing Assessment / Reassessment []  - 0 General Physical Exam (combine w/ comprehensive assessment (listed just below) when performed on new pt. evals) []  - 0 Comprehensive Assessment (HX, ROS, Risk Assessments, Wounds Hx, etc.) ASSESSMENTS - Wound and Skin Assessment / Reassessment []  - 0 Dermatologic / Skin Assessment (not related to wound area) ASSESSMENTS - Ostomy and/or Continence Assessment and Care []  - 0 Incontinence Assessment and Management []  - 0 Ostomy Care Assessment and Management (repouching, etc.) PROCESS - Coordination of Care []  - 0 Simple Patient / Family Education for ongoing care []  - 0 Complex (extensive) Patient / Family Education for ongoing care []  - 0 Staff obtains Programmer, systems, Records, T Results / Process Orders est []  - 0 Staff telephones HHA, Nursing Homes / Clarify orders / etc []  - 0 Routine Transfer to another Facility (non-emergent condition) Perrone, Aminah A ([id number]) [id number].pdf Page 2 of 9 []  - 0 Routine Hospital Admission (non-emergent condition) []  - 0 New Admissions / Biomedical engineer / Ordering NPWT Apligraf, etc. , []  - 0 Emergency Hospital Admission (emergent condition) PROCESS - Special Needs []  - 0 Pediatric / Minor Patient Management []  - 0 Isolation Patient Management []  - 0 Hearing / Language / Visual special needs []  - 0 Assessment of Community assistance (transportation, D/C planning, etc.) []  - 0 Additional assistance / Altered mentation []  - 0 Support Surface(s) Assessment (bed, cushion, seat, etc.) INTERVENTIONS - Miscellaneous []  - 0 External ear exam []  - 0 Patient Transfer (multiple staff / Civil Service fast streamer / Similar  devices) []  - 0 Simple Staple / Suture removal (25 or less) []  - 0 Complex Staple / Suture removal (26 or more) []  - 0 Hypo/Hyperglycemic Management (do not check if billed separately) []  -  0 Ankle / Brachial Index (ABI) - do not check if billed separately Has the patient been seen at the hospital within the last three years: Yes Total Score: 0 Level Of Care: ____ Electronic Signature(s) Signed: 01/24/2023 4:53:44 PM By: Massie Kluver Entered By: Massie Kluver on 01/24/2023 14:25:14 -------------------------------------------------------------------------------- Encounter Discharge Information Details Patient Name: Date of Service: [name] 01/24/2023 1:45 PM Medical Record Number: [id number] Patient Account Number: [id number] Date of Birth/Sex: Treating RN: [birth year] (47 y.o. Drema Pry Primary Care Meet Weathington: Raelene Bott Other Clinician: Massie Kluver Referring Burwell Bethel: Treating Arda Daggs/Extender: Mariana Single in Treatment: 85 Encounter Discharge Information Items Discharge Condition: Stable Ambulatory Status: Wheelchair Discharge Destination: Home Transportation: Private Auto Accompanied By: mother Schedule Follow-up Appointment: Yes Clinical Summary of Care: Electronic Signature(s) Signed: 01/24/2023 4:53:44 PM By: Massie Kluver Entered By: Massie Kluver on 01/24/2023 15:09:24 -------------------------------------------------------------------------------- Lower Extremity Assessment Details Patient Name: Date of Service: ALEXIOUS, GLUECKERT 01/24/2023 1:45 PM Medical Record Number: [id number] Patient Account Number: [id number] Date of Birth/Sex: Treating RN: 09-22-1976 (47 y.o. Drema Pry Primary Care Maisley Hainsworth: Raelene Bott Other Clinician: Massie Kluver Referring Andreas Sobolewski: Treating Juel Bellerose/Extender: Mariana Single in Treatment: [address], [place] ([id number]) [id number] Page 3  of 9 Electronic Signature(s) Signed: 01/24/2023 4:29:20 PM By: Rosalio Loud MSN RN CNS WTA Signed: 01/24/2023 4:53:44 PM By: Massie Kluver Entered By: Massie Kluver on 01/24/2023 14:16:18 -------------------------------------------------------------------------------- Multi Wound Chart Details Patient Name: Date of Service: [name] 01/24/2023 1:45 PM Medical Record Number: [id number] Patient Account Number: [id number] Date of Birth/Sex: Treating RN: 10/30/1975 (47 y.o. 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: Extremity Chronic sinus problems/congestion, Chronic sinus problems/congestion, N/A Comorbid History: Middle ear problems, Anemia, Chronic Middle ear problems, Anemia, Chronic Obstructive Pulmonary Disease Obstructive Pulmonary Disease (COPD), Congestive Heart Failure, (COPD), Congestive Heart Failure, Type II Diabetes, End Stage Renal Type II Diabetes, End Stage Renal Disease, History of pressure wounds, Disease, History of pressure wounds, Neuropathy Neuropathy 12/01/2021 03/16/2020 N/A Date Acquired: 6 17 N/A Weeks of Treatment: Open Open N/A Wound Status: No No N/A Wound Recurrence: 0.6x0.4x0.4 0.6x0.4x0.1 N/A Measurements L x W x D (cm) 0.188 0.188 N/A A (cm) : rea 0.075 0.019 N/A Volume (cm) : 89.80% 96.00% N/A % Reduction in Area: 95.50% 98.00% N/A % Reduction in Volume: Full Thickness Without Exposed Grade 3  N/A Classification: Support Structures Medium Medium N/A Exudate Amount: Serosanguineous Serous N/A Exudate Type: red, brown amber N/A Exudate Color: Distinct, outline attached Flat and Intact N/A Wound Margin: Medium (34-66%) Small (1-33%) N/A Granulation Amount: Pale Red, Pink N/A Granulation Quality: Medium (34-66%) Large (67-100%) N/A Necrotic Amount: Fat Layer (Subcutaneous Tissue): Yes Fat Layer (Subcutaneous Tissue): Yes N/A Exposed Structures: Fascia: No Fascia: No Tendon: No Tendon: No Muscle: No Muscle: No Joint: No Joint: No Bone: No Bone: No Medium (34-66%) None N/A Epithelialization: Treatment Notes Electronic Signature(s) Signed: 01/24/2023 4:53:44 PM By: Cliffton Asters ([id number]) PM By: Lottie Rater.pdf Page 4 of 9 Signed: 01/24/2023 4:53:44 Entered By: Massie Kluver on 01/24/2023 14:16:56 -------------------------------------------------------------------------------- Multi-Disciplinary Care Plan Details  Patient Name: Date of Service: ZIYAH, MANGLICMOT 01/24/2023 1:45 PM Medical Record Number: [id number] Patient Account Number: [id number] Date of Birth/Sex: Treating RN: 1976-03-16 (47 y.o. Drema Pry Primary Care Mauricio Dahlen: Raelene Bott Other Clinician: Massie Kluver Referring Derral Colucci: Treating Darshawn Boateng/Extender: Mariana Single in Treatment: 23 Active Inactive Abuse / Safety / Falls / Self Care Management Nursing Diagnoses: History of Falls Potential for falls Potential for injury related to falls Goals: Patient will not develop complications from immobility Date Initiated: 03/17/2022 Date Inactivated: 04/20/2022 Target Resolution Date: 03/16/2022 Goal Status: Met Patient/caregiver will verbalize understanding of skin care regimen Date Initiated: 03/17/2022 Date Inactivated: 09/28/2022 Target Resolution Date: 03/16/2022 Goal Status: Met Patient/caregiver will  verbalize/demonstrate measure taken to improve self care Date Initiated: 03/17/2022 Target Resolution Date: 03/16/2022 Goal Status: Active Interventions: Assess fall risk on admission and as needed Provide education on basic hygiene Provide education on personal and home safety Notes: Necrotic Tissue Nursing Diagnoses: Impaired tissue integrity related to necrotic/devitalized tissue Knowledge deficit related to management of necrotic/devitalized tissue Goals: Necrotic/devitalized tissue will be minimized in the wound bed Date Initiated: 03/17/2022 Target Resolution Date: 10/26/2022 Goal Status: Active Patient/caregiver will verbalize understanding of reason and process for debridement of necrotic tissue Date Initiated: 03/17/2022 Date Inactivated: 09/28/2022 Target Resolution Date: 03/16/2022 Goal Status: Met Interventions: Provide education on necrotic tissue and debridement process Treatment Activities: Apply topical anesthetic as ordered : 03/16/2022 Excisional debridement : 03/16/2022 Notes: Osteomyelitis Nursing Diagnoses: Infection: osteomyelitis Knowledge deficit related to disease process and management Goals: Patient/caregiver will verbalize understanding of disease process and disease management Date Initiated: 03/17/2022 Target Resolution Date: 03/16/2022 Goal Status: Active JOSCELYNE, KASIK A ([id number]) [id number] Page 5 of 9 Patient's osteomyelitis will resolve Date Initiated: 03/17/2022 Target Resolution Date: 10/26/2022 Goal Status: Active Signs and symptoms for osteomyelitis will be recognized and promptly addressed Date Initiated: 03/17/2022 Target Resolution Date: 10/26/2022 Goal Status: Active Interventions: Assess for signs and symptoms of osteomyelitis resolution every visit Provide education on osteomyelitis Treatment Activities: Systemic antibiotics : 03/16/2022 Notes: Patient on antibiotics. Pressure Nursing Diagnoses: Knowledge  deficit related to management of pressures ulcers Potential for impaired tissue integrity related to pressure, friction, moisture, and shear Goals: Patient will remain free from development of additional pressure ulcers Date Initiated: 11/02/2022 Target Resolution Date: 11/09/2022 Goal Status: Active Patient/caregiver will verbalize risk factors for pressure ulcer development Date Initiated: 11/02/2022 Target Resolution Date: 11/02/2022 Goal Status: Active Patient/caregiver will verbalize understanding of pressure ulcer management Date Initiated: 11/02/2022 Target Resolution Date: 11/02/2022 Goal Status: Active Interventions: Assess: immobility, friction, shearing, incontinence upon admission and as needed Provide education on pressure ulcers Notes: Electronic Signature(s) Signed: 01/24/2023 4:29:20 PM By: Rosalio Loud MSN RN CNS WTA Signed: 01/24/2023 4:53:44 PM By: Massie Kluver Entered By: Massie Kluver on 01/24/2023 14:25:44 -------------------------------------------------------------------------------- Pain Assessment Details Patient Name: Date of Service: Michel Harrow 01/24/2023 1:45 PM Medical Record Number: [id number] Patient Account Number: [id number] Date of Birth/Sex: Treating RN: 11-10-1975 (47 y.o. Drema Pry Primary Care Cevin Rubinstein: Raelene Bott Other Clinician: Massie Kluver Referring Shamecca Whitebread: Treating Anderson Coppock/Extender: Lockie Pares Weeks in Treatment: 49 Active Problems Location of Pain Severity and Description of Pain Patient Has Paino Yes Site Locations Pain Location: BRYAHNA, ROATH A ([id number]) [phone].pdf Page 6 of 9 Pain Location: Generalized Pain With Dressing Change: No Duration of the Pain. Constant / Intermittento Constant Rate the pain. Current Pain Level: 5 Character of Pain Describe the Pain: Heavy, Other: pressure pain Pain Management and Medication Current Pain  Management: Medication:  Yes Cold Application: No Rest: Yes Massage: No Activity: No T.E.N.S.: No Heat Application: No Leg drop or elevation: No Is the Current Pain Management Adequate: Inadequate How does your wound impact your activities of daily livingo Sleep: No Bathing: No Appetite: No Relationship With Others: No Bladder Continence: No Emotions: No Bowel Continence: No Work: No Toileting: No Drive: No Dressing: No Hobbies: No Engineer, maintenance) Signed: 01/24/2023 4:29:20 PM By: Rosalio Loud MSN RN CNS WTA Signed: 01/24/2023 4:53:44 PM By: Massie Kluver Entered By: Massie Kluver on 01/24/2023 14:03:38 -------------------------------------------------------------------------------- Patient/Caregiver Education Details Patient Name: Date of Service: Michel Harrow 4/2/2024andnbsp1:45 PM Medical Record Number: [id number] Patient Account Number: [id number] Date of Birth/Gender: Treating RN: May 07, 1976 (47 y.o. Drema Pry Primary Care Physician: Raelene Bott Other Clinician: Massie Kluver Referring Physician: Treating Physician/Extender: Mariana Single in Treatment: 35 Education Assessment Education Provided To: Patient Education Topics Provided Wound/Skin Impairment: Handouts: Other: continue wound care as directed Methods: Explain/Verbal Responses: State content correctly Electronic Signature(s) Signed: 01/24/2023 4:53:44 PM By: Massie Kluver Entered By: Massie Kluver on 01/24/2023 14:26:10 Nile Dear A ([id number]) [id number] Page 7 of 9 -------------------------------------------------------------------------------- Wound Assessment Details Patient Name: Date of Service: SAIDI, DEPIETRO 01/24/2023 1:45 PM Medical Record Number: [id number] Patient Account Number: [id number] Date of Birth/Sex: Treating RN: 10/17/1976 (47 y.o. Drema Pry Primary Care Kmarion Rawl: Raelene Bott Other Clinician: Massie Kluver Referring  Josuha Fontanez: Treating Eliseo Withers/Extender: Lockie Pares Weeks in Treatment: 44 Wound Status Wound Number: 11 Primary Open Surgical Wound Etiology: Wound Location: Right, Plantar Foot Wound Open Wounding Event: Surgical Injury Status: Date Acquired: 12/01/2021 Comorbid Chronic sinus problems/congestion, Middle ear problems, Anemia, Weeks Of Treatment: 44 History: Chronic Obstructive Pulmonary Disease (COPD), Congestive Heart Clustered Wound: No Failure, Type II Diabetes, End Stage Renal Disease, History of pressure wounds, Neuropathy Photos Wound Measurements Length: (cm) 0.6 Width: (cm) 0.4 Depth: (cm) 0.4 Area: (cm) 0.188 Volume: (cm) 0.075 % Reduction in Area: 89.8% % Reduction in Volume: 95.5% Epithelialization: Medium (34-66%) Wound Description Classification: Full Thickness Without Exposed Suppo Wound Margin: Distinct, outline attached Exudate Amount: Medium Exudate Type: Serosanguineous Exudate Color: red, brown rt Structures Foul Odor After Cleansing: No Slough/Fibrino Yes Wound Bed Granulation Amount: Medium (34-66%) Exposed Structure Granulation Quality: Pale Fascia Exposed: No Necrotic Amount: Medium (34-66%) Fat Layer (Subcutaneous Tissue) Exposed: Yes Tendon Exposed: No Muscle Exposed: No Joint Exposed: No Bone Exposed: No Treatment Notes Wound #11 (Foot) Wound Laterality: Plantar, Right Cleanser Wound Cleanser Discharge Instruction: Wash your hands with soap and water. Remove old dressing, discard into plastic bag and place into trash. Cleanse the wound with Wound Cleanser prior to applying a clean dressing using gauze sponges, not tissues or cotton balls. Do not scrub or use excessive force. Pat dry using gauze sponges, not tissue or cotton balls. Peri-Wound Care Topical Gentamicin Discharge Instruction: Apply as directed by Seaborn Nakama. in clinic Primary Dressing Aquacel Extra Hydrofiber Dressing, 2x2 (in/in) Slinger, Sukaina A ([id number])  [phone].pdf Page 8 of 9 Secondary Dressing ABD Pad 5x9 (in/in) Discharge Instruction: Cover with ABD pad Secured With Vian H Soft Cloth Surgical T ape ape, 2x2 (in/yd) Kerlix Roll Sterile or Non-Sterile 6-ply 4.5x4 (yd/yd) Discharge Instruction: Apply Kerlix as directed Compression Wrap Compression Stockings Add-Ons Electronic Signature(s) Signed: 01/24/2023 4:29:20 PM By: Rosalio Loud MSN RN CNS WTA Signed: 01/24/2023 4:53:44 PM By: Massie Kluver Entered By: Massie Kluver on 01/24/2023 14:15:43 -------------------------------------------------------------------------------- Wound Assessment Details Patient Name: Date of Service: STA  MALANNI, DEVOY A. 01/24/2023 1:45 PM Medical Record Number: [id number] Patient Account Number: [id number] Date of Birth/Sex: Treating RN: [birth year] (47 y.o. Drema Pry Primary Care Kashari Chalmers: Raelene Bott Other Clinician: Massie Kluver Referring Danile Trier: Treating Issa Luster/Extender: Lockie Pares Weeks in Treatment: 44 Wound Status Wound Number: 12 Primary Diabetic Wound/Ulcer of the Lower Extremity Etiology: Wound Location: Left, Medial, Plantar Foot Wound Open Wounding Event: Pressure Injury Status: Date Acquired: 03/16/2020 Comorbid Chronic sinus problems/congestion, Middle ear problems, Anemia, Weeks Of Treatment: 44 History: Chronic Obstructive Pulmonary Disease (COPD), Congestive Heart Clustered Wound: No Failure, Type II Diabetes, End Stage Renal Disease, History of pressure wounds, Neuropathy Photos Wound Measurements Length: (cm) 0.6 Width: (cm) 0.4 Depth: (cm) 0.1 Area: (cm) 0.188 Volume: (cm) 0.019 % Reduction in Area: 96% % Reduction in Volume: 98% Epithelialization: None Wound Description Classification: Grade 3 Wound Margin: Flat and Intact Exudate Amount: Medium Exudate Type: Serous Exudate Color: amber Foul Odor After Cleansing: No Slough/Fibrino  No Wound Bed Granulation Amount: Small (1-33%) Exposed Structure Granulation Quality: Red, Pink Fascia Exposed: No Necrotic Amount: Large (67-100%) Fat Layer (Subcutaneous Tissue) Exposed: Yes Necrotic Quality: Adherent Slough Tendon Exposed: No Sholl, Teal A ([id number]) [id number] Page 9 of 9 Muscle Exposed: No Joint Exposed: No Bone Exposed: No Treatment Notes Wound #12 (Foot) Wound Laterality: Plantar, Left, Medial Cleanser Wound Cleanser Discharge Instruction: Wash your hands with soap and water. Remove old dressing, discard into plastic bag and place into trash. Cleanse the wound with Wound Cleanser prior to applying a clean dressing using gauze sponges, not tissues or cotton balls. Do not scrub or use excessive force. Pat dry using gauze sponges, not tissue or cotton balls. Peri-Wound Care Topical Gentamicin Discharge Instruction: Apply as directed by Yaphet Sm[place]ur[place]. in clinic Primary Dressing Hydrofera Blue Ready Transfer Foam, 2.5x2.5 (in/in) Discharge Instruction: Apply Hydrofera Blue Ready to wound bed as directed [place] 4 1/4x 4 1/4 (in/in) Discharge Instruction: Apply to periwound Secondary Dressing ABD Pad 5x9 (in/in) Discharge Instruction: Cover with ABD pad Secured With [place] [facility] T ape ape, 2x2 (in/yd) Kerlix Roll Sterile or Non-Sterile 6-ply 4.5x4 (yd/yd) Discharge Instruction: Apply Kerlix as directed Compression Wrap Compression Stockings Add-Ons Electronic Signature(s) Signed: 01/24/2023 4:29:20 PM By: Rosalio Loud MSN RN CNS WTA Signed: 01/24/2023 4:53:44 PM By: Massie Kluver Entered By: Massie Kluver on 01/24/2023 14:16:08 -------------------------------------------------------------------------------- [place] Details Patient Name: Date of Service: [name] 01/24/2023 1:45 PM Medical Record Number: [id number] Patient Account Number: [id number] Date of Birth/Sex: Treating  RN: [birth year] (47 y.o. Drema Pry Primary Care Jailene Cupit: Raelene Bott Other Clinician: Massie Kluver Referring Czar Ysaguirre: Treating Miko Markwood/Extender: Mariana Single in Treatment: 44 Vital Signs Time Taken: 14:00 Temperature (F): 98.3 Height (in): 69 Pulse (bpm): 71 Weight (lbs): 178 Respiratory Rate (breaths/min): 16 Body Mass Index (BMI): 26.3 Blood Pressure (mmHg): 140/88 Reference Range: 80 - 120 mg / dl Electronic Signature(s) Signed: 01/24/2023 4:53:44 PM By: Massie Kluver Entered By: Massie Kluver on 01/24/2023 14:03:30

## 2023-01-31 ENCOUNTER — Encounter: Payer: Medicaid Other | Admitting: Physician Assistant

## 2023-01-31 DIAGNOSIS — E11621 Type 2 diabetes mellitus with foot ulcer: Secondary | ICD-10-CM | POA: Diagnosis not present

## 2023-01-31 NOTE — Progress Notes (Signed)
LAYNI, PELTZ [name] ([id number]) [id number] Page 1 of 9 Visit Report for 01/31/2023 Arrival Information Details Patient Name: Date of Service: [name], [name] 01/31/2023 10:45 [name]: [id number] Patient Account Number: [id number] Date of Birth/Sex: Treating RN: 19-May-1976 (47 y.o. Skip Mayer Primary Care Abrahan Fulmore: [name] Other Clinician: [name] Referring Brannon Levene: Treating Shamal Stracener/Extender: [name] in Treatment: 45 Visit Information History Since Last Visit All ordered tests and consults were completed: No Patient Arrived: Wheel Chair Added or deleted any medications: No Arrival Time: 10:44 Any new allergies or adverse reactions: No Transfer Assistance: None Had [name] fall or experienced change in No Patient Identification Verified: Yes activities of daily living that may affect Secondary Verification Process Completed: Yes risk of falls: Patient Requires Transmission-Based Precautions: No Signs or symptoms of abuse/neglect since last visito No Patient Has Alerts: No Hospitalized since last visit: No Implantable device outside of the clinic excluding No cellular tissue based products placed in the center since last visit: Has Dressing in Place as Prescribed: Yes Has Footwear/Offloading in Place as Prescribed: Yes Left: T Contact Cast otal Right: Wedge Shoe Pain Present Now: No Electronic Signature(s) Unsigned Entered ByBetha Loa on 01/31/2023 10:52:01 -------------------------------------------------------------------------------- Clinic Level of Care Assessment Details Patient Name: Date of Service: [name], [name] 01/31/2023 10:45 [name]: [id number] Patient Account Number: [id number] Date of Birth/Sex: Treating RN: October 21, 1976 (47 y.o. Skip Mayer Primary Care Houda Brau: [name] Other Clinician: [name] Referring Willis Holquin: Treating  Emberleigh Reily/Extender: [name] in Treatment: 45 Clinic Level of Care Assessment Items TOOL 1 Quantity Score []  - 0 Use when EandM and Procedure is performed on INITIAL visit ASSESSMENTS - Nursing Assessment / Reassessment []  - 0 General Physical Exam (combine w/ comprehensive assessment (listed just below) when performed on new pt. evals) []  - 0 Comprehensive Assessment (HX, ROS, Risk Assessments, Wounds Hx, etc.) ASSESSMENTS - Wound and Skin Assessment / Reassessment []  - 0 Dermatologic / Skin Assessment (not related to wound area) ASSESSMENTS - Ostomy and/or Continence Assessment and Care []  - 0 Incontinence Assessment and Management []  - 0 Ostomy Care Assessment and Management (repouching, etc.) PROCESS - Coordination of Care []  - 0 Simple Patient / Family Education for ongoing care []  - 0 Complex (extensive) Patient / Family Education for ongoing care []  - 0 Staff obtains Chiropractor, Records, T Results / Process Orders est []  - 0 Staff telephones HHA, Nursing Homes / Clarify orders / etc [name], [name] ([id number]) [phone].pdf Page 2 of 9 []  - 0 Routine Transfer to another Facility (non-emergent condition) []  - 0 Routine Hospital Admission (non-emergent condition) []  - 0 New Admissions / Manufacturing engineer / Ordering NPWT Apligraf, etc. , []  - 0 Emergency Hospital Admission (emergent condition) PROCESS - Special Needs []  - 0 Pediatric / Minor Patient Management []  - 0 Isolation Patient Management []  - 0 Hearing / Language / Visual special needs []  - 0 Assessment of Community assistance (transportation, D/C planning, etc.) []  - 0 Additional assistance / Altered mentation []  - 0 Support Surface(s) Assessment (bed, cushion, seat, etc.) INTERVENTIONS - Miscellaneous []  - 0 External ear exam []  - 0 Patient Transfer (multiple staff / Nurse, adult / Similar devices) []  - 0 Simple Staple / Suture removal (25 or  less) []  - 0 Complex Staple / Suture removal (26 or more) []  - 0 Hypo/Hyperglycemic Management (do not check if billed separately) []  - 0 Ankle / Brachial Index (  ABI) - do not check if billed separately Has the patient been seen at the hospital within the last three years: Yes Total Score: 0 Level Of Care: ____ Electronic Signature(s) Unsigned Entered By: Betha LoaVenable, Angie on 01/31/2023 11:35:53 -------------------------------------------------------------------------------- Encounter Discharge Information Details Patient Name: Date of Service: [name], [name] 01/31/2023 10:45 [name]: [id number] Patient Account Number: [id number] Date of Birth/Sex: Treating RN: [birth year] (47 y.o. Skip MayerF) [name], [name] Primary Care Zarriah Starkel: [name], [name] Other Clinician: Betha LoaVenable, Angie Referring Deaire Mcwhirter: Treating Farren Landa/Extender: Bethann GooStone, Hoyt Hoffman, [name] Weeks in Treatment: 45 Encounter Discharge Information Items Post Procedure Vitals Discharge Condition: Stable Temperature (F): 98.6 Ambulatory Status: Wheelchair Pulse (bpm): 60 Discharge Destination: Home Respiratory Rate (breaths/min): 16 Transportation: Private Auto Blood Pressure (mmHg): 145/81 Accompanied By: mother Schedule Follow-up Appointment: Yes Clinical Summary of Care: Electronic Signature(s) Unsigned Entered ByBetha Loa: Venable, Angie on 01/31/2023 11:37:36 -------------------------------------------------------------------------------- Lower Extremity Assessment Details Patient Name: Date of Service: [name], [name] 01/31/2023 10:45 [name]: [id number] Patient Account Number: [id number] [name], [name] ([id number]) [id number] Page 3 of 9 Date of Birth/Sex: Treating RN: [birth year] (47 y.o. Skip MayerF) [name], [name] Primary Care Lewis Keats: Other Clinician: Debby FreibergHoffman, [name] Venable, Angie Referring Aniela Caniglia: Treating Angelos Wasco/Extender: Gillermo MurdochStone, Hoyt Hoffman, [name] Weeks in Treatment:  45 Electronic Signature(s) Unsigned Entered By: Betha LoaVenable, Angie on 01/31/2023 11:10:36 -------------------------------------------------------------------------------- Multi Wound Chart Details Patient Name: Date of Service: [name], [name] 01/31/2023 10:45 [name]: [id number] Patient Account Number: [id number] Date of Birth/Sex: Treating RN: [birth year] (47 y.o. 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: Extremity Chronic sinus problems/congestion, Chronic sinus problems/congestion, N/[name] Comorbid History: Middle ear problems, Anemia, Chronic Middle ear problems, Anemia, Chronic Obstructive Pulmonary Disease Obstructive Pulmonary Disease (COPD), Congestive Heart Failure, (COPD), Congestive Heart Failure, Type II Diabetes, End Stage Renal Type II Diabetes, End Stage Renal Disease, History of pressure wounds, Disease, History of pressure wounds, Neuropathy Neuropathy 12/01/2021 03/16/2020 N/[name] Date Acquired: 45 45 N/[name] Weeks of Treatment: Open Open N/[name] Wound Status: No No N/[name] Wound Recurrence: 0.4x0.3x0.3 0.7x0.5x0.2 N/[name] Measurements L x W x D (cm) 0.094 0.275 N/[name] [name] (cm) : rea 0.028 0.055 N/[name] Volume (cm) : 94.90% 94.20% N/[name] % Reduction in Area: 98.30% 94.20% N/[name] % Reduction in Volume: Full Thickness Without Exposed Grade 3 N/[name] Classification: Support Structures Medium Medium N/[name] Exudate Amount: Serosanguineous Serous  N/[name] Exudate Type: red, brown amber N/[name] Exudate Color: Distinct, outline attached Flat and Intact N/[name] Wound Margin: Medium (34-66%) Small (1-33%) N/[name] Granulation Amount: Pale Red, Pink N/[name] Granulation Quality: Medium (34-66%) Large (67-100%) N/[name] Necrotic Amount: Fat Layer (Subcutaneous Tissue): Yes Fat Layer (Subcutaneous Tissue): Yes N/[name] Exposed Structures: Fascia: No Fascia: No Tendon: No Tendon: No Muscle: No Muscle: No Joint: No Joint: No Bone: No Bone: No Medium (34-66%) None N/[name] Epithelialization: Juste, Shade [name] ([id number]) [id number] Page 4 of 9 Treatment Notes Electronic Signature(s) Unsigned Entered By: Betha LoaVenable, Angie on 01/31/2023 11:10:41 -------------------------------------------------------------------------------- Multi-Disciplinary Care Plan Details Patient Name: Date of Service: [name], [name] 01/31/2023 10:45 [name]: [id number] Patient Account Number: [id number] Date of Birth/Sex: Treating RN: [birth year] (  47 y.o. Cathlean Cower, [name] Primary Care Daryus Sowash: [name] Other Clinician: [name] Referring Mazel Villela: Treating Brodrick Curran/Extender: [name] Weeks in Treatment: 46 Active Inactive Abuse / Safety / Falls / Self Care Management Nursing Diagnoses: History of Falls Potential for falls Potential for injury related to falls Goals: Patient will not develop complications from immobility Date Initiated: 03/17/2022 Date Inactivated: 04/20/2022 Target Resolution Date: 03/16/2022 Goal Status: Met Patient/caregiver will verbalize understanding of skin care regimen Date Initiated: 03/17/2022 Date Inactivated: 09/28/2022 Target Resolution Date: 03/16/2022 Goal Status: Met Patient/caregiver will verbalize/demonstrate measure taken to improve self care Date Initiated: 03/17/2022 Target Resolution Date: 03/16/2022 Goal Status: Active Interventions: Assess fall risk on admission and as  needed Provide education on basic hygiene Provide education on personal and home safety Notes: Necrotic Tissue Nursing Diagnoses: Impaired tissue integrity related to necrotic/devitalized tissue Knowledge deficit related to management of necrotic/devitalized tissue Goals: Necrotic/devitalized tissue will be minimized in the wound bed Date Initiated: 03/17/2022 Target Resolution Date: 10/26/2022 Goal Status: Active Patient/caregiver will verbalize understanding of reason and process for debridement of necrotic tissue Date Initiated: 03/17/2022 Date Inactivated: 09/28/2022 Target Resolution Date: 03/16/2022 Goal Status: Met Interventions: Provide education on necrotic tissue and debridement process Treatment Activities: Apply topical anesthetic as ordered : 03/16/2022 Excisional debridement : 03/16/2022 Notes: Osteomyelitis Nursing Diagnoses: BULAH, LURIE [name] ([id number]) [phone].pdf Page 5 of 9 Infection: osteomyelitis Knowledge deficit related to disease process and management Goals: Patient/caregiver will verbalize understanding of disease process and disease management Date Initiated: 03/17/2022 Target Resolution Date: 03/16/2022 Goal Status: Active Patient's osteomyelitis will resolve Date Initiated: 03/17/2022 Target Resolution Date: 10/26/2022 Goal Status: Active Signs and symptoms for osteomyelitis will be recognized and promptly addressed Date Initiated: 03/17/2022 Target Resolution Date: 10/26/2022 Goal Status: Active Interventions: Assess for signs and symptoms of osteomyelitis resolution every visit Provide education on osteomyelitis Treatment Activities: Systemic antibiotics : 03/16/2022 Notes: Patient on antibiotics. Pressure Nursing Diagnoses: Knowledge deficit related to management of pressures ulcers Potential for impaired tissue integrity related to pressure, friction, moisture, and shear Goals: Patient will remain free from development of  additional pressure ulcers Date Initiated: 11/02/2022 Target Resolution Date: 11/09/2022 Goal Status: Active Patient/caregiver will verbalize risk factors for pressure ulcer development Date Initiated: 11/02/2022 Target Resolution Date: 11/02/2022 Goal Status: Active Patient/caregiver will verbalize understanding of pressure ulcer management Date Initiated: 11/02/2022 Target Resolution Date: 11/02/2022 Goal Status: Active Interventions: Assess: immobility, friction, shearing, incontinence upon admission and as needed Provide education on pressure ulcers Notes: Electronic Signature(s) Unsigned Entered By: [name] on 01/31/2023 11:36:10 -------------------------------------------------------------------------------- Pain Assessment Details Patient Name: Date of Service: [name], [name] 01/31/2023 10:45 [name]: [id number] Patient Account Number: [id number] Date of Birth/Sex: Treating RN: [birth year] (46 y.o. Skip Mayer Primary Care Ibeth Fahmy: [name] Other Clinician: [name] Referring Legrande Hao: Treating Kailena Lubas/Extender: [name] Weeks in Treatment: 45 Active Problems Location of Pain Severity and Description of Pain Patient Has Paino No Site Locations [place], [place] [name] ([id number]) [id number] Page 6 of 9 Pain Management and Medication Current Pain Management: Electronic Signature(s) Unsigned Entered By: [name] on 01/31/2023 10:57:18 -------------------------------------------------------------------------------- Patient/Caregiver Education Details Patient Name: Date of Service: [name], [name] 4/9/2024andnbsp10:45 [name]: [id number] Patient Account Number: [id number] Date of Birth/Gender: Treating RN: May 22, 1976 (47 y.o. Skip Mayer Primary Care Physician: [name] Other Clinician: [name] Referring Physician: Treating Physician/Extender: [name] in Treatment: 45 Education Assessment Education Provided To: Patient Education Topics Provided  Wound/Skin Impairment: Handouts: Other: CONTINUE WOUND CARE AS DIRECTED Methods: Explain/Verbal Responses: State content correctly Electronic Signature(s) Unsigned Entered By: [name] on 01/31/2023 11:36:31 -------------------------------------------------------------------------------- Wound Assessment Details Patient Name: Date of Service: [name], [name] 01/31/2023 10:45 [name]: [id number] Patient Account Number: [id number] Date of Birth/Sex: Treating RN: Nov 17, 1975 (47 y.o. Skip Mayer Primary Care Braelen Sproule: [name] Other Clinician: [name] Referring Alverto Shedd: Treating Kenechukwu Eckstein/Extender: [name] Weeks in Treatment: 45 Wound Status Wound Number: 11 Primary Open Surgical Wound Etiology: SAHIRAH, RUDELL [name] ([id number]) [id number].pdf Page 7 of 9 Etiology: Wound Location: Right, Plantar Foot Wound Open Wounding Event: Surgical Injury Status: Date Acquired: 12/01/2021 Comorbid Chronic sinus problems/congestion, Middle ear problems, Anemia, Weeks Of Treatment: 45 History: Chronic Obstructive Pulmonary Disease (COPD), Congestive Heart Clustered Wound: No Failure, Type II Diabetes, End Stage Renal Disease, History of pressure wounds, Neuropathy Photos Wound Measurements Length: (cm) 0.4 Width: (cm) 0.3 Depth: (cm) 0.3 Area: (cm) 0.094 Volume: (cm) 0.028 % Reduction in Area: 94.9% % Reduction in Volume: 98.3% Epithelialization: Medium (34-66%) Wound Description Classification: Full Thickness Without Exposed Suppo Wound Margin: Distinct, outline attached Exudate Amount: Medium Exudate Type: Serosanguineous Exudate Color: red, brown rt Structures Foul Odor After Cleansing: No Slough/Fibrino Yes Wound Bed Granulation Amount: Medium (34-66%) Exposed  Structure Granulation Quality: Pale Fascia Exposed: No Necrotic Amount: Medium (34-66%) Fat Layer (Subcutaneous Tissue) Exposed: Yes Tendon Exposed: No Muscle Exposed: No Joint Exposed: No Bone Exposed: No Treatment Notes Wound #11 (Foot) Wound Laterality: Plantar, Right Cleanser Wound Cleanser Discharge Instruction: Wash your hands with soap and water. Remove old dressing, discard into plastic bag and place into trash. Cleanse the wound with Wound Cleanser prior to applying [name] clean dressing using gauze sponges, not tissues or cotton balls. Do not scrub or use excessive force. Pat dry using gauze sponges, not tissue or cotton balls. Peri-Wound Care Topical Gentamicin Discharge Instruction: Apply as directed by Carlyann Placide. in clinic Primary Dressing Aquacel Extra Hydrofiber Dressing, 2x2 (in/in) Secondary Dressing ABD Pad 5x9 (in/in) Discharge Instruction: Cover with ABD pad Secured With Medipore T - 36M Medipore H Soft Cloth Surgical T ape ape, 2x2 (in/yd) Kerlix Roll Sterile or Non-Sterile 6-ply 4.5x4 (yd/yd) Discharge Instruction: Apply Kerlix as directed Compression Wrap Compression Stockings TESSI, EUSTACHE [name] ([id number]) [phone].pdf Page 8 of 9 Add-Ons Electronic Signature(s) Unsigned Entered By: [name] on 01/31/2023 11:10:26 -------------------------------------------------------------------------------- Wound Assessment Details Patient Name: Date of Service: TORRANCE, FRECH 01/31/2023 10:45 [name]: [id number] Patient Account Number: [id number] Date of Birth/Sex: Treating RN: 1975/12/22 (47 y.o. Cathlean Cower, [name] Primary Care Brittane Grudzinski: [name] Other Clinician: [name] Referring Shamika Pedregon: Treating Forrester Blando/Extender: [name] Weeks in Treatment: 45 Wound Status Wound Number: 12 Primary Diabetic Wound/Ulcer of the Lower Extremity Etiology: Wound Location: Left, Medial, Plantar  Foot Wound Open Wounding Event: Pressure Injury Status: Date Acquired: 03/16/2020 Comorbid Chronic sinus problems/congestion, Middle ear problems, Anemia, Weeks Of Treatment: 45 History: Chronic Obstructive Pulmonary Disease (COPD), Congestive Heart Clustered Wound: No Failure, Type II Diabetes, End Stage Renal Disease, History of pressure wounds, Neuropathy Photos Wound Measurements Length: (cm) 0.7 Width: (cm) 0.5 Depth: (cm) 0.2 Area: (cm) 0.275 Volume: (cm) 0.055 % Reduction in Area: 94.2% % Reduction in Volume: 94.2% Epithelialization: None Wound Description Classification: Grade 3 Wound Margin: Flat and Intact Exudate Amount: Medium Exudate Type: Serous Exudate Color: amber Foul Odor After Cleansing: No Slough/Fibrino No Wound Bed Granulation Amount: Small (1-33%) Exposed Structure Granulation Quality: Red, Pink  Fascia Exposed: No Necrotic Amount: Large (67-100%) Fat Layer (Subcutaneous Tissue) Exposed: Yes Necrotic Quality: Adherent Slough Tendon Exposed: No Muscle Exposed: No Joint Exposed: No Bone Exposed: No Treatment Notes Wound #12 (Foot) Wound Laterality: Plantar, Left, Medial Cleanser Wound Cleanser Discharge Instruction: Wash your hands with soap and water. Remove old dressing, discard into plastic bag and place into trash. Cleanse the THEMA, HOLSON [name] ([id number]) [id number] Page 9 of 9 wound with Wound Cleanser prior to applying [name] clean dressing using gauze sponges, not tissues or cotton balls. Do not scrub or use excessive force. Pat dry using gauze sponges, not tissue or cotton balls. Peri-Wound Care Topical Gentamicin Discharge Instruction: Apply as directed by Roderic Lammert. in clinic Primary Dressing Hydrofera Blue Ready Transfer Foam, 2.5x2.5 (in/in) Discharge Instruction: Apply Hydrofera Blue Ready to wound bed as directed Secondary Dressing ABD Pad 5x9 (in/in) Discharge Instruction: Cover with ABD pad Secured  With Medipore T - 9M Medipore H Soft Cloth Surgical T ape ape, 2x2 (in/yd) Kerlix Roll Sterile or Non-Sterile 6-ply 4.5x4 (yd/yd) Discharge Instruction: Apply Kerlix as directed Compression Wrap Compression Stockings Add-Ons Electronic Signature(s) Unsigned Entered By: [name] on 01/31/2023 11:09:43 -------------------------------------------------------------------------------- Vitals Details Patient Name: Date of Service: FLORENE, MANRIQUEZ 01/31/2023 10:45 [name]: [id number] Patient Account Number: [id number] Date of Birth/Sex: Treating RN: 06-06-1976 (47 y.o. Cathlean Cower, [name] Primary Care Jeanpierre Thebeau: [name] Other Clinician: [name] Referring Netra Postlethwait: Treating Can Lucci/Extender: [name] in Treatment: 45 Vital Signs Time Taken: 10:52 Temperature (F): 98.6 Height (in): 69 Pulse (bpm): 60 Weight (lbs): 178 Respiratory Rate (breaths/min): 16 Body Mass Index (BMI): 26.3 Blood Pressure (mmHg): 145/82 Reference Range: 80 - 120 mg / dl Electronic Signature(s) Unsigned Entered ByBetha Loa on 01/31/2023 10:57:09 Signature(s): Date(s):

## 2023-01-31 NOTE — Progress Notes (Signed)
Heather PhoenixSTALEY, [name] ([id number]) [id number] Page 1 of 4 Visit Report for 01/31/2023 Chief Complaint Document Details Patient Name: Date of Service: Heather FredericksonSTA [name], [name] 01/31/2023 10:45 [name]: [id number] Patient Account Number: [id number] Date of Birth/Sex: Treating RN: [birth year] (47 y.o. Skip MayerF) [name], [name] Primary Care Provider: [name], [name] Other Clinician: Betha LoaVenable, Angie Referring Provider: Treating Provider/Extender: Bethann GooStone, Willies Laviolette Hoffman, [name] Weeks in Treatment: 45 Information Obtained from: Patient Chief Complaint 03/19/2021; patient referred by Dr. Excell SeltzerBaker who has been looking after her left foot for quite [name] period of time for review of [name] nonhealing area in the left midfoot 03/12/2022; bilateral feet wounds and right lower extremity wound. Electronic Signature(s) Signed: 01/31/2023 10:37:54 AM By: Lenda KelpStone III, Kloe Oates PA-C Entered By: Lenda KelpStone III, Leonard Hendler on 01/31/2023 10:37:54 -------------------------------------------------------------------------------- Debridement Details Patient Name: Date of Service: Peri JeffersonSTA [name], [name]. 01/31/2023 10:45 [name]: [id number] Patient Account Number: [id number] Date of Birth/Sex: Treating RN: [birth year] (47 y.o. [name]) [name], [name] Primary Care Provider: [name], [name] Other Clinician: Betha LoaVenable, Angie Referring Provider: Treating Provider/Extender: Bethann GooStone, Azilee Pirro Hoffman, [name] Weeks in Treatment: 45 Debridement Performed for Assessment: Wound #12 Left,Medial,Plantar Foot Performed By: Physician Nelida MeuseStone, Melora Menon E., PA-C Debridement Type: Debridement Severity of Tissue Pre Debridement: Fat layer exposed Level of Consciousness (Pre-procedure): Awake and Alert Pre-procedure Verification/Time Out Yes - 11:26 Taken: Start Time: 11:26 T Area Debrided (L x W): otal 3 (cm) x 3 (cm) = 9 (cm) Tissue and other material debrided: Viable, Non-Viable, Callus, Slough, Subcutaneous, Slough Level: Skin/Subcutaneous  Tissue Debridement Description: Excisional Instrument: Curette Bleeding: Minimum Hemostasis Achieved: Pressure Response to Treatment: Procedure was tolerated well Level of Consciousness (Post- Awake and Alert procedure): Post Debridement Measurements of Total Wound Length: (cm) 0.7 Width: (cm) 0.5 Depth: (cm) 0.2 Volume: (cm) 0.055 Character of Wound/Ulcer Post Debridement: Stable Severity of Tissue Post Debridement: Fat layer exposed Post Procedure Diagnosis Same as Pre-procedure Electronic Signature(s) Unsigned Entered By: Betha LoaVenable, Angie on 01/31/2023 11:31:25 Signature(s): Heather PhoenixSTALEY, [name] ([id number]) [id number]) [id number] Date(s): [id number] Page 2 of 4 -------------------------------------------------------------------------------- Debridement Details Patient Name: Date of Service: Heather FredericksonSTA [name], [name] 01/31/2023 10:45 [name]: [id number] Patient Account Number: [id number] Date of Birth/Sex: Treating RN: [birth year] (47 y.o. [name]) [name], [name] Primary Care Provider: [name], [name] Other Clinician: Betha LoaVenable, Angie Referring Provider: Treating Provider/Extender: Bethann GooStone, Jovanni Rash Hoffman, [name] Weeks in Treatment: 45 Debridement Performed for Assessment: Wound #11 Right,Plantar Foot Performed By: Physician Nelida MeuseStone, Yuktha Kerchner E., PA-C Debridement Type: Debridement Level of Consciousness (Pre-procedure): Awake and Alert Pre-procedure Verification/Time Out Yes - 11:32 Taken: Start Time: 11:32 T Area Debrided (L x W): otal 0.4 (cm) x 0.3 (cm) = 0.12 (cm) Tissue and other material debrided: Viable, Non-Viable, Callus, Slough, Subcutaneous, Slough Level: Skin/Subcutaneous Tissue Debridement Description: Excisional Instrument: Curette Bleeding: Minimum Hemostasis Achieved: Pressure Response to Treatment: Procedure was tolerated well Level of Consciousness (Post- Awake and Alert procedure): Post Debridement Measurements of Total Wound Length: (cm) 0.4 Width: (cm)  0.3 Depth: (cm) 0.3 Volume: (cm) 0.028 Character of Wound/Ulcer Post Debridement: Stable Post Procedure Diagnosis Same as Pre-procedure Electronic Signature(s) Unsigned Entered ByBetha Loa: Venable, Angie on 01/31/2023 11:34:24 -------------------------------------------------------------------------------- Physician Orders Details Patient Name: Date of Service: Heather FredericksonSTA [name], [name] 01/31/2023 10:45 [name]: [id number] Patient Account Number: [id number] Date of Birth/Sex: Treating RN: [birth year] (47 y.o. 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 Nurse Visit as needed Bathing/ Shower/ Hygiene May shower with wound dressing protected with water repellent cover or cast protector. No tub bath. [name], [name] ([id number]) [id number] Page 3 of 4 Anesthetic (Use 'Patient Medications' Section for Anesthetic Order Entry) Lidocaine applied to wound bed Edema Control - Lymphedema / Segmental Compressive Device / Other Elevate, Exercise Daily and [name] void Standing for Long Periods of Time. Elevate legs to the level of the heart and pump ankles as often as possible Elevate leg(s) parallel to the floor when sitting. Off-Loading Total Contact Cast to Left Lower  Extremity Open toe surgical shoe - Heel off loader - Right foot Other: - keep pressure off of feet. Additional Orders / Instructions Follow Nutritious Diet and Increase Protein Intake Other: - Stay off of foot, wheelchair only Wound Treatment Wound #11 - Foot Wound Laterality: Plantar, Right Cleanser: Wound Cleanser 1 x Per Day/30 Days Discharge Instructions: Wash your hands with soap and water. Remove old dressing, discard into plastic bag and place into trash. Cleanse the wound with Wound Cleanser prior to applying [name] clean dressing using gauze sponges, not tissues or cotton balls. Do not scrub or use excessive force. Pat dry using gauze sponges, not tissue or cotton balls. Topical: Gentamicin 1 x Per Day/30 Days Discharge Instructions: Apply as directed by provider. in clinic Prim Dressing: Aquacel Extra Hydrofiber Dressing, 2x2 (in/in) ary 1 x Per Day/30 Days Secondary Dressing: ABD Pad 5x9 (in/in) (Generic) 1 x Per Day/30 Days Discharge Instructions: Cover with ABD pad Secured With: Medipore T - 64M Medipore H Soft Cloth Surgical T ape ape, 2x2 (in/yd) 1 x Per Day/30 Days Secured With: [employer] Sterile or Non-Sterile 6-ply 4.5x4 (yd/yd) 1 x Per Day/30 Days Discharge Instructions: Apply Kerlix as directed Wound #12 - Foot Wound Laterality: Plantar, Left, Medial Cleanser: Wound Cleanser 1 x Per Week/30 Days Discharge Instructions: Wash your hands with soap and water. Remove old dressing, discard into plastic bag and place into trash. Cleanse the wound with Wound Cleanser prior to applying [name] clean dressing using gauze sponges, not tissues or cotton balls. Do not scrub or use excessive force. Pat dry using gauze sponges, not tissue or cotton balls. Topical: Gentamicin 1 x Per Week/30 Days Discharge Instructions: Apply as directed by provider. in clinic Prim Dressing: Hydrofera Blue Ready Transfer Foam, 2.5x2.5 (in/in) 1 x Per Week/30 Days ary Discharge Instructions: Apply Hydrofera  Blue Ready to wound bed as directed Secondary Dressing: ABD Pad 5x9 (in/in) (Generic) 1 x Per Week/30 Days Discharge Instructions: Cover with ABD pad Secured With: Medipore T - 64M Medipore H Soft Cloth Surgical T ape ape, 2x2 (in/yd) 1 x Per Week/30 Days Secured With: [employer] or Non-Sterile 6-ply 4.5x4 (yd/yd) 1 x Per Week/30 Days Discharge Instructions: Apply Kerlix as directed Electronic Signature(s) Unsigned Entered By: [name] on 01/31/2023 11:35:43 -------------------------------------------------------------------------------- Problem List Details Patient Name: Date of Service: [name], [name] 01/31/2023 10:45 [name]: [id number] Patient Account Number: [id number] Date of Birth/Sex: Treating RN: 12-26-1975 (47 y.o. F)  Huel Coventry Primary Care Provider: [name] Other Clinician: [name] Referring Provider: Treating Provider/Extender: [name] in Treatment: 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 Cathlean Cower, [name] Primary Care Provider: [name] Other Clinician: [name] Referring Provider: Treating Provider/Extender: [name] in Treatment: [phone] T Contact Cast Applied for Wound Assessment: otal Wound #12 Left,Medial,Plantar Foot Performed By: Physician Nelida Meuse., PA-C Post Procedure Diagnosis Same as Pre-procedure Electronic Signature(s) Unsigned Entered By: [name] on 01/31/2023 11:35:04 Signature(s): Date(s):

## 2023-02-08 ENCOUNTER — Encounter (HOSPITAL_BASED_OUTPATIENT_CLINIC_OR_DEPARTMENT_OTHER): Payer: Medicaid Other | Admitting: Internal Medicine

## 2023-02-08 DIAGNOSIS — L97512 Non-pressure chronic ulcer of other part of right foot with fat layer exposed: Secondary | ICD-10-CM | POA: Diagnosis not present

## 2023-02-08 DIAGNOSIS — E11621 Type 2 diabetes mellitus with foot ulcer: Secondary | ICD-10-CM

## 2023-02-08 DIAGNOSIS — L97528 Non-pressure chronic ulcer of other part of left foot with other specified severity: Secondary | ICD-10-CM

## 2023-02-08 DIAGNOSIS — Z794 Long term (current) use of insulin: Secondary | ICD-10-CM

## 2023-02-08 NOTE — Progress Notes (Signed)
HENNA, DERDERIAN A ([id number]) [id number] Page 1 of 20 Visit Report for 02/08/2023 Chief Complaint Document Details Patient Name: Date of Service: [name], [name] 02/08/2023 10:00 A M Medical Record Number: [id number] Patient Account Number: [id number] Date of Birth/Sex: Treating RN: 11-19-1975 (47 y.o. [name], [name] Primary Care Provider: [name] Other Clinician: Referring Provider: Treating Provider/Extender: Chapman Moss in Treatment: 57 Information Obtained from: Patient Chief Complaint 03/19/2021; patient referred by Dr. Excell Seltzer who has been looking after her left foot for quite a period of time for review of a nonhealing area in the left midfoot 03/12/2022; bilateral feet wounds and right lower extremity wound. Electronic Signature(s) Signed: 02/08/2023 11:12:29 AM By: Geralyn Corwin DO Entered By: Geralyn Corwin on 02/08/2023 10:53:23 -------------------------------------------------------------------------------- Debridement Details Patient Name: Date of Service: [name], [name] 02/08/2023 10:00 A M Medical Record Number: [id number] Patient Account Number: [id number] Date of Birth/Sex: Treating RN: 1976-07-15 (47 y.o. [name], [name] Primary Care Provider: [name] Other Clinician: Referring Provider: Treating Provider/Extender: Chapman Moss in Treatment: 47 Debridement Performed for Assessment: Wound #12 Left,Medial,Plantar Foot Performed By: Physician Geralyn Corwin, MD Debridement Type: Debridement Severity of Tissue Pre Debridement: Fat layer exposed Level of Consciousness (Pre-procedure): Awake and Alert Pre-procedure Verification/Time Out Yes - 10:43 Taken: Start Time: 10:43 Pain Control: Lidocaine 2% T opical Gel T Area Debrided (L x W): otal 0.5 (cm) x 0.5 (cm) = 0.25 (cm) Tissue and other material debrided: Viable, Non-Viable, Callus, Slough, Skin: Dermis ,  Slough Level: Skin/Dermis Debridement Description: Selective/Open Wound Instrument: Curette Bleeding: Minimum Hemostasis Achieved: Pressure End Time: 10:44 Procedural Pain: 0 Post Procedural Pain: 0 Response to Treatment: Procedure was tolerated well Level of Consciousness (Post- Awake and Alert procedure): Post Debridement Measurements of Total Wound Length: (cm) 0.5 Width: (cm) 0.5 Depth: (cm) 0.2 Volume: (cm) 0.039 Character of Wound/Ulcer Post Debridement: Stable Severity of Tissue Post Debridement: Fat layer exposed Post Procedure Diagnosis Same as Pre-procedure Electronic Signature(s) Signed: 02/08/2023 11:12:29 AM By: Geralyn Corwin DO Signed: 02/08/2023 2:56:04 PM By: Garen Grams, Somaly A ([id number]) [id number] Page 2 of 20 Entered By: Bonnell Public on 02/08/2023 10:44:43 -------------------------------------------------------------------------------- Debridement Details Patient Name: Date of Service: DREW, HERMAN 02/08/2023 10:00 A M Medical Record Number: [id number] Patient Account Number: [id number] Date of Birth/Sex: Treating RN: January 22, 1976 (47 y.o. [name], [name] Primary Care Provider: [name] Other Clinician: Referring Provider: Treating Provider/Extender: Chapman Moss in Treatment: 47 Debridement Performed for Assessment: Wound #11 Right,Plantar Foot Performed By: Physician Geralyn Corwin, MD Debridement Type: Debridement Level of Consciousness (Pre-procedure): Awake and Alert Pre-procedure Verification/Time Out Yes - 10:43 Taken: Start Time: 10:43 Pain Control: Lidocaine 2% T opical Gel T Area Debrided (L x W): otal 0.5 (cm) x 0.5 (cm) = 0.25 (cm) Tissue and other material debrided: Viable, Non-Viable, Callus, Slough, Subcutaneous, Slough Level: Skin/Subcutaneous Tissue Debridement Description: Excisional Instrument: Curette Bleeding: Minimum Hemostasis Achieved:  Pressure End Time: 10:44 Procedural Pain: 0 Post Procedural Pain: 0 Response to Treatment: Procedure was tolerated well Level of Consciousness (Post- Awake and Alert procedure): Post Debridement Measurements of Total Wound Length: (cm) 0.5 Width: (cm) 0.5 Depth: (cm) 0.3 Volume: (cm) 0.059 Character of Wound/Ulcer Post Debridement: Stable Post Procedure Diagnosis Same as Pre-procedure Electronic Signature(s) Signed: 02/08/2023 11:12:29 AM By: Geralyn Corwin DO Signed: 02/08/2023 2:56:04 PM By: Bonnell Public Entered By: Bonnell Public on 02/08/2023 10:45:25 -------------------------------------------------------------------------------- HPI Details Patient Name: Date of Service: [name], [name] 02/08/2023 10:00 A  M Medical Record Number: [id number] Patient Account Number: [id number] Date of Birth/Sex: Treating RN: [birth year] (47 y.o. [name], [name] Primary Care Provider: [name] Other Clinician: Referring Provider: Treating Provider/Extender: Chapman Moss in Treatment: 38 History of Present Illness HPI Description: 01/18/18-She is here for initial evaluation of the left great toe ulcer. She is a poor historian in regards to timeframe in detail. She states approximately 4 weeks ago she lacerated her toe on something in the house. She followed up with her primary care who placed her on Bactrim and ultimately a second dose of Bactrim prior to coming to wound clinic. She states she has been treating the toe with peroxide, Betadine and a Band-Aid. She did not check her blood sugar this morning but checked it yesterday morning it was 327; she is unaware of a recent A1c and there are no current records. She saw Dr. she would've orthopedics last week for an old injury to the left ankle, she states he did not see her toe, nor did she bring it to his attention. She smokes approximately 1 pack cigarettes a day. Her social situation is concerning, she arrives  this morning with her mother who appears extremely intoxicated/under the influence; her mother was asked to leave the room and be monitored by the patient's grandmother. The patient's aunt then accompanied the patient and the room throughout the rest of the appointment. We had a lengthy discussion regarding the deleterious effects of uncontrolled hyperglycemia and smoking as it relates to wound healing and overall health. She was strongly encouraged to decrease her smoking and get her diabetes under better control. She states she is currently on a diet and has cut down her [facility] consumption. The left toe is erythematous, macerated and slightly edematous with malodor present. The edema in her left foot is below her baseline, there is no erythema streaking. We will treat her with Santyl, doxycycline; we have ordered and xray, culture JENNAVIEVE, ARRICK A ([id number]) [id number] Page 3 of 20 and provided a Peg assist surgical shoe and cultured the wound. 01/25/18-She is here in follow-up evaluation for a left great toe ulcer and presents with an abscess to her suprapubic area. She states her blood sugars remain elevated, feeling "sick" and if levels are below 250, but she is trying. She has made no attempt to decrease her smoking stating that we "can't take away her food in her cigarettes". She has been compliant with offloading using the PEG assist you. She is using Santyl daily. the culture obtained last week grew staph aureus and Enterococcus faecalis; continues on the doxycycline and Augmentin was added on Monday. The suprapubic area has erythema, no femoral variation, purple discoloration, minimal induration, was accessed with a cotton tip applicator with sanguinopurulent drainage, this was cultured, I suspect the current antibiotic treatment will cover and we will not add anything to her current treatment plan. She was advised to go to urgent care or ER with any change  in redness, induration or fever. 02/01/18-She is here in follow-up evaluation for left great toe ulcers and a new abdominal abscess from last week. She was able to use packing until earlier this week, where she "forgot it was there". She states she was feeling ill with GI symptoms last week and was not taking her antibiotic. She states her glucose levels have been predominantly less than 200, with occasional levels between 200-250. She thinks this was contributing to her GI symptoms as they have resolved without intervention. There  continues to be significant laceration to left toe, otherwise it clinically looks stable/improved. There is now less superficial opening to the lateral aspect of the great toe that was residual blister. We will transition to [facility] to all wounds, she will continue her Augmentin. If there is no change or deterioration next week for reculture. 02/08/18-She is here in follow-up evaluation for left great toe ulcer and abdominal ulcer. There is an improvement in both wounds. She has been wrapping her left toe with coban, not by our direction, which has created an area of discoloration to the medial aspect; she has been advised to NOT use coban secondary to her neuropathy. She states her glucose levels have been high over this last week ranging from 200-350, she continues to smoke. She admits to being less compliant with her offloading shoe. We will continue with same treatment plan and she will follow-up next week. 02/15/18-She is here in follow-up evaluation for left great toe ulcer and abdominal ulcer. The abdominal ulcer is epithelialized. The left great toe ulcer is improved and all injury from last week using the Coban wrap is resolved, the lateral ulcer is healed. She admits to noncompliance with wearing offloading shoe and admits to glucose levels being greater than 300 most of the week. She continues to smoke and expresses no desire to quit. There is one area  medially that probes deeper than it has historically, erythema to the toe and dorsal foot has consistently waxed and waned. There is no overt signs of cellulitis or infection but we will culture the wound for any occult infection given the new area of depth and erythema. We will hold off on sensitivities for initiation of antibiotic therapy. 02/22/18-She is here in follow up evaluation for left great toe ulcer. There is overall significant improvement in both wound appearance, erythema and edema with changes made last week. She was not initiated on antibiotic therapy. Culture obtained last week showed oxacillin sensitive staph aureus, sensitive to clindamycin. Clindamycin has been called into the pharmacy but she has been instructed to hold off on initiation secondary to overall clinical improvement and her history of antibiotic intolerance. She has been instructed to contact the clinic with any noted changes/deterioration and the wound, erythema, edema and/or pain. She will follow-up next week. She continues to smoke and her glucose levels remain elevated >250; she admits to compliance with offloading shoe 03/01/18 on evaluation today patient appears to be doing fairly well in regard to her left first toe ulcer. She has been tolerating the dressing changes with the [facility] Dressing without complication and overall this has definitely showed signs of improvement according to records as well is what the patient tells me today. I'm very pleased in that regard. She is having no pain today 03/08/18 She is here for follow up evaluation of a left great toe ulcer. She remains non-compliant with glucose control and smoking cessation; glucose levels consistently >200. She states that she got new shoe inserts/peg assist. She admits to compliance with offloading. Since my last evaluation there is significant improvement. We will switch to prisma at this time and she will follow up next week. She is noted to be  tachycardic at this appointment, heart rate 120s; she has a history of heart rate 70-130 according to our records. She admits to extreme agitation r/t personal issues; she was advised to monitor her heartrate and contact her physician if it does not return to a more normal range (<100). She takes cardizem twice  daily. 03/15/18-She is here in follow-up evaluation for left great toe ulcer. She remains noncompliant with glucose control and smoking cessation. She admits to compliance with wearing offloading shoe. The ulcer is improved/stable and we will continue with the same treatment plan and she will follow-up next week 03/22/18-She is here for evaluation for left great toe ulcer. There continues to be significant improvement despite recurrent hyperglycemia (over 500 yesterday) and she continues to smoke. She has been compliant with offloading and we will continue with same treatment plan and she will follow-up next week. 03/29/18-She is here for evaluation for left great toe ulcer. Despite continuing to smoke and uncontrolled diabetes she continues to improve. She is compliant with offloading shoe. We will continue with the same treatment plan and she will follow-up next week 04/05/18- She is here in follow up evaluation for a left great toe ulcer; she presents with small pustule to left fifth toe (resembles ant bite). She admits to compliance with wearing offloading shoe; continues to smoke or have uncontrolled blood glucose control. There is more callus than usual with evidence of bleeding; she denies known trauma. 04/12/18-She is here for evaluation of left great toe ulcer. Despite noncompliance with glycemic control and smoking she continues to make improvement. She continues to wear offloading shoe. The pustule, that was identified last week, to the left fifth toe is resolved. She will follow-up in 2 weeks 05/03/18-she is seen in follow-up evaluation for a left great toe ulcer. She is compliant with  offloading, otherwise noncompliant with glycemic control and smoking. She has plateaued and there is minimal improvement noted. We will transition to [facility], replaced the insert to her surgical shoe and she will follow-up in one week 05/10/18- She is here in follow up evaluation for a left great toe ulcer. It appears stable despite measurement change. We will continue with same treatment plan and follow up next week. 05/24/18-She is seen in follow-up evaluation for a left great toe ulcer. She remains compliant with offloading, has made significant improvement in her diet, decreasing the amount of sugar/soda. She said her recent A1c was 10.9 which is lower than. She did see a diabetic nutritionist/educator yesterday. She continues to smoke. We will continue with the same treatment plan and she'll follow-up next week. 05/31/18- She is seen in follow-up evaluation for left great toe ulcer. She continues to remain compliant with offloading, continues to make improvement in her diet, increasing her water and decreasing the amount of sugar/soda. She does continue to smoke with no desire to quit. We will apply Prisma to the depth and Hydrofera Blue over. We have not received insurance authorization for oasis. She will follow up next week. 06/07/18-She is seen in follow-up evaluation for left great toe ulcer. It has stalled according to today's measurements although base appears stable. She says she saw a diabetic educator yesterday; her average blood sugars are less than 300 which is an improvement for her. She continues to smoke and states "that's my next step" She continues with water over soda. We will order for xray, culture and reinstate ace wrap compression prior to placing apligraf for next week. She is voicing no complaints or concerns. Her dressing will change to iodoflex over the next week in preparation for apligraf. 06/14/18-She is seen in follow-up evaluation for left great toe ulcer. Plain  film x-ray performed last week was negative for osteomyelitis. Wound culture obtained last week grew strep B and OSSA; she is initiated on keflex and cefdinir today;  there is erythema to the toe which could be from ace wrap compression, she has a history of wrapping too tight and has has been encouraged to maintain ace wraps that we place today. We will hold off on application of apligraf today, will apply next week after antibiotic therapy has been initiated. She admits today that she has resumed taking a shower with her foot/toe submerged in water, she has been reminded to keep foot/toe out of the bath water. She will be seen in follow up next week 06/21/18-she is seen in follow-up evaluation for left great toe ulcer. She is tolerating antibiotic therapy with no GI disturbance. The wound is stable. Apligraf was applied today. She has been decreasing her smoking, only had 4 cigarettes yesterday and 1 today. She continues being more compliant in diabetic diet. She will follow-up next week for evaluation of site, if stable will remove at 2 weeks. 06/28/18- She is here in follow up evalution. Apligraf was placed last week, she states the dressing fell off on Tuesday and she was dressing with hydrofera blue. She is healed and will be discharged from the clinic today. She has been instructed to continue with smoking cessation, continue monitoring glucose levels, offloading for an additional 4 weeks and continue with hydrofera blue for additional two weeks for any possible microscopic opening. Readmission: 08/07/18 on evaluation today patient presents for reevaluation concerning the ulcer of her right great toe. She was previously discharged on 06/28/18 healed. Nonetheless she states that this began to show signs of drainage she subsequently went to her primary care provider. Subsequently an x-ray was performed on 08/01/18 which was negative. The patient was also placed on antibiotics at that time. Fortunately  they should have been effective for the infection. Nonetheless she's been experiencing some improvement but still has a lot of drainage coming from the wound itself. 08/14/18 on evaluation today patient's wound actually does show signs of improvement in regard to the erythema at this point. She has completed the antibiotics. With that being said we did discuss the possibility of placing her in a total contact cast as of today although I think that I may want to give this just a little bit more time to ensure nothing recurrence as far as her infection is concerned. I do not want to put in the cast and risk infection at that time if things are not completely resolved. With that being said she is gonna require some debridement today. 08/21/18 on evaluation today patient actually appears to be doing okay in regard to her toe ulcer. She's been tolerating the dressing changes without complication. With that being said it does appear that she is ready and in fact I think it's appropriate for [place] to go ahead and initiate the total contact cast today. Nonetheless she will require some sharp debridement to prepare the wound for application. Overall I feel like things have been progressing well but we do need DOROTHIE, WAH A ([id number]) [id number] Page 4 of 20 to do something to get this to close more readily. 08/24/18 patient seen today for reevaluation after having had the total contact cast applied on Tuesday. She seems to have done very well the wound appears to be doing great and overall I'm pleased with the progress that she's made. There were no abnormal areas of rubbing from the cast on her lower extremity. 08/30/18 on evaluation today patient actually appears to be completely healed in regard to her plantar toe ulcer. She tells me at this  point she's been having a lot of issues with the cast. She almost fell a couple of times the state shall the step of her dog a couple times  as well. This is been a very frustrating process for her other nonetheless she has completely healed the wound which is excellent news. Overall there does not appear to be the evidence of infection at this time which is great news. 09/11/18 evaluation today patient presents for follow-up concerning her great toe ulcer on the left which has unfortunately reopened since I last saw her which was only a couple of weeks ago. Unfortunately she was not able to get in to get the shoe and potentially the AFO that's gonna be necessary due to her left foot drop. She continues with offloading shoe but this is not enough to prevent her from reopening it appears. When we last had her in the total contact cast she did well from a healing standpoint but unfortunately the wound reopened as soon as she came out of the cast within just a couple of weeks. 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 This is also part of the reason why she falls on a regular basis. I just do not believe that is gonna be safe for [place] to reinitiate the total contact cast as last time we had this on she fell 3 times one week which is definitely not normal for her. 09/18/18 upon evaluation today the patient actually appears to be doing about the same in regard to her toe ulcer. She did not contact [employer] as I asked her to even though I had given her the prescription. In fact she actually states that she has no idea where the prescription is. She did apparently call [employer] and they told her that all she needed to do was bring the prescription in order to be able to be seen and work on getting the AFO for her left foot. With all that being said she still does not have an appointment and I'm not sure were things stand that regard. I will give her a new  prescription today in order to contact them to get this set up. 09/25/18 on evaluation today patient actually appears to be doing about the same in regard to her toes ulcer. She does have a small areas which seems to have a lot of callous buildup around the edge of the wound which is going to need sharp debridement today. She still is waiting to be scheduled for evaluation with [employer] for possibility of an AFO. She states there supposed to call her tomorrow to get this set up. Unfortunately it does appear that her foot specifically the toe area is showing signs of erythema. There does not appear to be any systemic infection which is in these good news. 10/02/18 on evaluation today patient actually appears to be doing about the same in regard to her toe ulcer. This really has not done too well although it's not significantly larger it's also not significantly smaller. She has been tolerating the dressing changes without complication. She actually has her appointment with [facility] and [place] tomorrow to hopefully be measured for obtaining and AFO splint. I think this would be helpful preventing this from reoccurring. We had contemplated starting the cast this week  although to be honest I am reluctant to do that as she's been having nausea, vomiting, and seizure activity over the past three days. She has a history of seizures and have been told is nothing that can be done for these. With that being said I do believe that along with the seizures have the nausea vomiting which upon further questioning doesn't seem to be the normal for her and makes me concerned for the possibility of infection or something else going on. I discussed this with the patient and her mother during the office visit today. I do not feel the wound is effective but maybe something else. The responses this was "this just happens to her at times and we don't know why". They did not seem to be interested in going to the hospital to  have this checked out further. 10/09/18 on evaluation today patient presents for follow-up concerning her ongoing toe ulcer. She has been tolerating the dressing changes without complication. Fortunately there does not appear to be any evidence of infection which is great news however I do think that the patient would benefit from going ahead for with the total contact cast. She's actually in a wheelchair today she tells me that she will use her walker if we initiate the cast. I was very specific about the fact that if we were gonna do the cast I wanted to make sure that she was using the walker in order to prevent any falls. She tells me she does not have stairs that she has to traverse on a regular basis at her home. She has not had any seizures since last week again that something that happens to her often she tells me she did talk to [employer] and they said that it may take up to three weeks to get the brace approved for her. Hopefully that will not take that long but nonetheless in the meantime I do think the cast could be of benefit. 10/12/18 on evaluation today patient appears to be doing rather well in regard to her toe ulcer. It's just been a few days and already this is significantly improved both as far as overall appearance and size. Fortunately there's no sign of infection. She is here for her first obligatory cast change. 10/19/18 Seen today for follow up and management of left great toe ulcer. Wound continues to show improvement. Noted small open area with seroussang drainage with palpation. Denies any increased pain or recent fevers during visit. She will continue calcium alginate with offloading shoe. Denies any questions or concerns during visit. 10/26/18 on evaluation today patient appears to be doing about the same as when I last saw her in regard to her wound bed. Fortunately there does not appear to be any signs of infection. Unfortunately she continues to have a breakdown in regard  to the toe region any time that she is not in the cast. It takes almost no time at all for this to happen. Nonetheless she still has not heard anything from the brace being made by [employer] as to when exactly this will be available to her. Fortunately there is no signs of infection at this time. 10/30/18 on evaluation today patient presents for application of the total contact cast as we just received him this morning. Fortunately we are gonna be able to apply this to her today which is great news. She continues to have no significant pain which is good news. Overall I do feel like things have been improving while she  was the cast is when she doesn't have a cast that things get worse. She still has not really heard anything from [employer] regarding her brace. 11/02/18 upon evaluation today patient's wound already appears to be doing significantly better which is good news. Fortunately there does not appear to be any signs of infection also good news. Overall I do think the total contact cast as before is helping to heal this area unfortunately it's just not gonna likely keep the area closed and healed without her getting her brace at least. Again the foot drop is a significant issue for her. 11/09/18 on evaluation today patient appears to be doing excellent in regard to her toe ulcer which in fact is completely healed. Fortunately we finally got the situation squared away with the paperwork which was needed to proceed with getting her brace approved by Medicaid. I have filled that out unfortunately that information has been sent to the orthopedic office that I worked at 2 1/2 years ago and not tired Current wound care measures. Fortunately she seems to be doing very well at this time. 11/23/18 on evaluation today patient appears to be doing More Poorly Compared to Last Time I Saw Her. At [facility] She Had Completely Healed. Currently she is continuing to have issues with reopening. She states that she just  found out that the brace was approved through Medicaid now she just has to go get measured in order to have this fitted for her and then made. Subsequently she does not have an appointment for this yet that is going to complicate things we obviously cannot put her back in the cast if we do not have everything measured because they're not gonna be able to measure her foot while she is in the cast. 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 Obviously this is unfortunate and does have me somewhat worried as well. 11/30/18 on evaluation today patient's toe ulcer actually appears to be doing fairly well. The good news is she will be getting her brace in the shoes next week on Wednesday. Hopefully we will be able to get this to heal without having to go back in the cast however she may need the cast in order to get the wound completely heal and then go from there. Fortunately there's no signs of infection at this time. 12/07/18 on evaluation today patient fortunately did receive her brace and she states she could tell this definitely makes her walk better. With that being said she's been having issues with her toe where she noticed yesterday there was a lot of tissue that was loosing off this appears to be much larger than what it was previous. She also states that her leg has been read putting much across the top of her foot just about the ankle although this seems to be receiving somewhat. The total area is still red and appears to be someone infected as best I can tell. She is previously taken Bactrim and that may be a good option for her today as well. We are gonna see what I wound culture shows as well and I think that this is definitely appropriate. With that being said outside of the culture I still need to initiate something in the interim and that's what I'm gonna go ahead and select Bactrim is a good  option for her. 12/14/18 on evaluation today patient appears to  be doing better in regard to her left great toe ulcer as compared to last week's evaluation. There's still some erythema although this is significantly improved which is excellent news. Overall I do believe that she is making good progress is still gonna take some time VESTA, WHEELAND A ([id number]) [id number] Page 5 of 20 before she is where I would like her to be from the standpoint of being able to place her back into the total contact cast. Hopefully we will be where we need to be by next week. 12/21/18 on evaluation today patient actually appears to be doing poorly in regard to her toe ulcer. She's been tolerating the dressing changes without complication. Fortunately there's no signs of systemic infection although she does have a lot of drainage from the toe ulcer and this does seem to be causing some issues at this point. She does have erythema on the distal portion of her toe that appears to be likely cellulitis. 12/28/18 on evaluation today patient actually appears to be doing a little better in my pinion in regard to her toe ulcer. With that being said she still does have some evidence of infection at this time and for her culture she had both E. coli as well as enterococcus as organisms noted on evaluation. For that reason I think that though the Keflex likely has treated the E. coli rather well this has really done nothing for the enterococcus. We are going to have to initiate treatment for this specifically. 01/04/19 on evaluation today patient's toe actually appears to be doing better from the standpoint of infection. She currently would like to see about putting the cash back on I think that this is appropriate as long as she takes care of it and keeps it from getting wet. She is gonna have some drainage we can definitely pass this up with Drawtex and alginate to try to prevent as much drainage as  possible from causing the problems. With that being said I do want to at least try her with the cast between now and Tuesday. If there any issues we can't continue to use it then I will discontinue the use of the cast at that point. 01/08/19 on evaluation today patient actually appears to be doing very well as far as her foot ulcer specifically the great toe on the left is concerned. She did have an area of rubbing on the medial aspect of her left ankle which again is from the cast. Fortunately there's no signs of infection at this point in this appears to be a very slight skin breakdown. The patient tells me she felt it rubbing but didn't think it was that bad. Fortunately there is no signs of active infection at this time which is good news. No fevers, chills, nausea, or vomiting noted at this time. 01/15/19 on evaluation today patient actually appears to be doing well in regard to her toe ulcer. Again as previous she seems to do well and she has the cast on which indicates to me that during the time she doesn't have a cast on she's putting way too much pressure on this region. Obviously I think that's gonna be an issue as with the current national emergency concerning the Covid-19 Virus it has been recommended that we discontinue the use of total contact casting by the chief medical officer of our company, Dr. Maurine Minister. The reasoning is that if a patient becomes sick and cannot come into have the cast removed they could not just leave  this on for an additional two weeks. Obviously the hospitals also do not want to receive patient's who are sick into the emergency department to potentially contaminate the region and spread the Covid-19 Virus among other sick individuals within the hospital system. Therefore at this point we are suspending the use of total contact cast until the current emergency subsides. This was all discussed with the patient today as well. 01/22/19 on evaluation today patient's wound on  her left great toe appears to be doing slightly worse than previously noted last week. She tells me that she has been on this quite a bit in fact she tells me she's been awake for 38 straight hours. This is due to the fact that she's having to care for grandparents because nobody else will. She has been taking care of them for five the last seven days since I've seen her they both have dementia his is from a stroke and her grandmother's was progressive. Nonetheless she states even her mom who knows her condition and situation has only help two of those days to take care of them she's been taking care of the rest. Fortunately there does not appear to be any signs of active infection in regard to her toe at this point although obviously it doesn't look as good as it did previous. I think this is directly related to her not taking off the pressure and friction by way of taking things easy. Though I completely understand what's going on. 01/29/19 on evaluation today patient's tools are actually appears to be showing some signs of improvement today compared to last week's evaluation as far as not necessarily the overall size of the wound but the fact that she has some new skin growth in between the two ends of the wound opening. Overall I feel like she has done well she states that she had a family member give her what sounds to be a CAM walker boot which has been helpful as well. 02/05/19 on evaluation today patient's wound bed actually appears to be doing significantly better in regard to her overall appearance of the size of the wound. With that being said she is still having an issue with offloading efficiently enough to get this to close. Apparently there is some signs of infection at this point as well unfortunately. Previously she's done well of Augmentin I really do not see anything that needs to be culture currently but there theme and cellulitis of the foot that I'm seeing I'm gonna go ahead and place  her on an antibiotic today to try to help clear this up. 02/12/2019 on evaluation today patient actually appears to be doing poorly in regard to her overall wound status. She tells me she has been using her offloading shoe but actually comes in today wearing her tennis shoe with the AFO brace. Again as I previously discussed with her this is really not sufficient to allow the area to heal appropriately. Nonetheless she continues to be somewhat noncompliant and I do wonder based on what she has told my nurse in the past as to whether or not a good portion of this noncompliance may be recreational drug and alcohol related. She has had a history of heroin overdose and this was fairly recently in the past couple of months that have been seeing her. Nonetheless overall I feel like her wound looks significantly worse today compared to what it was previous. She still has significant erythema despite the Augmentin I am not sure that this  is an appropriate medication for her infection I am also concerned that the infection may have gone down into her bone. 02/19/19 on evaluation today patient actually appears to be doing about the same in regard to her toe ulcer. Unfortunately she continues to show signs of bone exposure and infection at this point. There does not appear to be any evidence of worsening of the infection but I'm also not really sure that it's getting significantly better. She is on the Augmentin which should be sufficient for the Staphylococcus aureus infection that she has at this point. With that being said she may need IV antibiotics to more appropriately treat this. We did have a discussion today about hyperbaric option therapy. 02/28/19 on evaluation today patient actually appears to be doing much worse in regard to the wound on her left great toe as compared to even my previous evaluation last week. Unfortunately this seems to be training in a pretty poor direction. Her toe was actually now  starting to angle laterally and I can actually see the entire joint area of the proximal portion of the digit where is the distal portion of the digit again is no longer even in contact with the joint line. Unfortunately there's a lot more necrotic tissue around the edge and the toe appears to be showing signs of becoming gangrenous in my pinion. I'm very concerned about were things stand at this point. She did see infectious disease and they are planning to send in a prescription for Sivextro for her and apparently this has been approved. With that being said I don't think she should avoid taking this but at the same time I'm not sure that it's gonna be sufficient to save her toe at this point. 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 03/05/19 on evaluation today patient actually appears to be doing quite well in regard to her toe all things considering. She still has bone exposed but there appears to be much less your thing on overall the appearance of the wound and the toe itself is dramatically improved. She still does have some issues currently obviously with infection she did see vascular as well and there concerned that her blood flow to the toad. For that reason they are setting up for an angiogram next week. 03/14/19 on evaluation today patient appears to be doing very poor in regard to her toe and specifically in regard to the ulceration and the fact that she's starting to notice the toe was leaning even more towards the lateral aspect and the complete joint is visible on the proximal aspect of the joint. Nonetheless she's also noted a significant odor and the tip of the toe is turning more dark and necrotic appearing. Overall I think she is getting worse not better as far as this is  concerned. For that reason I am recommending at this point that she likely needs to be seen for likely amputation. READMISSION 03/19/2021 This is a patient that we cared for in this clinic for a prolonged period of time in 2019 and 2020 with a left foot and left first toe wound. 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  Baker. In December 2021 she had an ulcer on her right great toe as well as the fourth and fifth toes. 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 She also had an angiogram at that time and underwent angioplasty of the right anterior tibial artery. In any case she claims that the wound on the right foot is closed I did not look at this today which was probably an oversight although I think that should be done next week. After her surgery she developed a dehiscence but I do not see any follow-up of this. According to Dr. Bernette Redbird last review that she was out of the area being cared for by another physician but recently came back to his attention. 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 A culture of this area showed E. coli apparently before she came back to see Dr. Excell Seltzer she was supposed to be receiving antibiotics but she did not really take them. Nor is she offloading this area at all. Finally her last hemoglobin A1c listed in epic was in March 2022 at 14.1 she says things are a lot better since then although I am not sure. 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 She had this wound at the time. During this admission she had E. coli sepsis a TEE was negative. Past medical history is extensive and includes type 2 diabetes with peripheral neuropathy  cardiomyopathy with an ejection fraction of 33%, hypertension, hyperlipidemia chronic renal failure stage III history of substance abuse with cocaine although she claims to be clean now verified by her mother. She is still a heavy cigarette smoker. She has a history of bipolar disorder seizure disorder ABI in our clinic was 1.05 6/1; left midfoot in the setting of a TMA done previously. Round circular wound with a "knuckle" of protruding tissue. The problem is that the knuckle was not attached to any of the surrounding granulation and this probed proximally widely I removed a large portion of this tissue. This wound goes with considerable undermining laterally. I do not feel any bone there was no purulence but this is a deep wound. 6/8; in spite of the debridement I did last week. She arrives with a wound looking exactly the same. A protruding "knuckle" of tissue nonadherent to most of the surrounding tissue. There is considerable depth around this from 6-12 o'clock at 2.7 cm and undermining of 1 cm. This does not look overtly infected and the x- ray I did last week was negative for any osseous abnormalities. We have been using silver collagen 6/15; deep tissue culture I did last week showed moderate staph aureus and moderate Pseudomonas. This will definitely require prolonged antibiotic therapy. The pathology on the protuberant area was negative for malignancy fungus etc. 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 We have been using silver collagen. I am going to be prescribing Levaquin for 2 weeks. Her CT scan of the foot is down for 7/5 6/22; CT scan of the foot on 7 5. She says she has hardware in the left leg from her previous fracture. She is on the Levaquin for the deep tissue culture I did that showed methicillin sensitive staph aureus and Pseudomonas. I gave her a 2-week supply and she will have another week. She arrives in clinic  today with the same protuberant  tissue however this is nonadherent to the tissue surrounding it. I am really at a loss to explain this unless there is underlying deep tissue infection 6/29; patient presents for 1 week follow-up. She has been using collagen to the wound bed. She reports taking her antibiotics as prescribed.She has no complaints or issues today. She denies signs of infection. 7/6; patient presents for one week followup. She has been using collagen to the wound bed. She states she is taking Levaquin however at times she is not able to keep it down. She denies signs of infection. 7/13; patient presents for 1 week follow-up. She has been using silver alginate to the wound bed. She still has nausea when taking her antibiotics. She denies signs of infection. 7/20; patient presents for 1 week follow-up. She has been using silver alginate with gentamicin cream to the wound bed. She denies any issues and has no complaints today. She denies signs of infection. 7/27; patient presents for 1 week follow-up. She continues to use silver alginate with gentamicin cream to the wound bed. She reports starting her antibiotics. She has no issues or complaints. Overall she reports stability to the wound. 8/3; patient presents for 1 week follow-up. She has been using silver alginate with gentamicin cream to the wound bed. She reports completing all antibiotics. She has no issues or complaints today. She denies signs of infection. 8/17; patient presents for 2-week follow-up. He is to use silver alginate to the wound bed. She has no issues or complaints today. She denies signs of infection. She reports her pain has improved in her foot since last clinic visit 8/24; patient presents for 1 week follow-up. She continues to use silver alginate to the wound bed. She has no issues or complaints. She denies signs of infection. Pain is stable. 9/7; patient presents for follow-up. She missed her last week  appointment due to feeling ill. She continues to use silver alginate. She has a new wound to the right lower extremity that is covered in eschar. She states It occurred over the past week and has no idea how it started. She currently denies signs of infection. 9/14; patient presents for follow-up. T the left foot wound she has been using gentamicin cream and silver alginate. T the right lower extremity wound she has o o been keeping this covered and has not obtain Santyl. 9/21; patient presents for follow-up. She reports using gentamicin cream and silver alginate to the left foot and Santyl to the right lower extremity wound. She has no issues or complaints today. She denies signs of infection. 9/28; patient presents for follow-up. She reports a new wound to her right heel. She states this occurred a few days ago and is progressively gotten worse. She has been trying to clean the area with a Q-tip and Santyl. She reports stability in the other 2 wounds. She has been using gentamicin cream and silver alginate to the left foot and Santyl to the right lower extremity wound. 10/12; patient presents for follow-up. She reports improvement to the wound beds. She is seeing vein and vascular to discuss the potential of a left BKA. She states they are going to do an arteriogram. She continues to use silver alginate with dressing changes to her wounds. 11/2; patient presents for follow-up. She states she has not been doing dressing changes to the wound beds. She states she is not able to offload the areas. She reports chronic pain to her left foot wound. 11/9; patient presents for follow-up.  She came in with only socks on. She states she forgot to put on shoes. It is unclear if she is doing any dressing changes. She currently denies systemic signs of infection. 11/16; patient presents for follow-up. She came again only with socks on. She states she does not wear shoes ever. It is unclear if she does dressing  changes. She currently denies systemic signs of infection. 11/23; patient presents for follow-up. She wore her shoes today. It still unclear exactly what dressing she is using for each wound but she did states she obtained Dakin's solution and has been using this to the left foot wound. She currently denies signs of infection. 11/30; patient presents for follow-up. She has no issues or complaints today. She currently denies signs of infection. 12/7; patient presents for follow-up. She has no issues or complaints today. She has been using Hydrofera Blue to the right heel wound and Dakin solution to the left foot wound. Her right anterior leg wound is healed. She currently denies signs of infection. 12/14; patient presents for follow-up. She has been using Hydrofera Blue to the right heel and Dakin's to the left foot wounds. She has no issues or complaints today. She denies signs of infection. 12/21; patient presents for follow-up. She reports using Hydrofera Blue to the right heel and Dakin's to the left foot wound. She denies signs of infection. 12/28; patient presents for follow-up. She continues to use Dakin's to the left foot wound and Hydrofera Blue to the right heel wound. She denies signs of infection. 1/4; patient presents for follow-up. She has no issues or complaints today. She denies signs of infection. 1/11; patient presents for follow-up. It is unclear if she has been dressing these wounds over the past week. She currently denies signs of infection. 1/18; patient presents for follow-up. She states she has been using Dakin's wet-to-dry dressings to the left foot. She has been using Hydrofera Blue to the right foot foot wound. She states that the anterior right leg wound has reopened and draining serous fluid. She denies signs of infection. 1/25; patient presents for follow-up. She has no issues or complaints today. 2/1; patient presents for follow-up. She has no issues or complaints  today. She denies signs of infection. 2/8; patient presents for follow-up. She has lost her surgical shoes. She did not have a dressing to the right heel wound. She currently denies signs of infection. EUDORA, GUEVARRA A ([id number]) [id number] Page 7 of 20 2/15; patient presents for follow-up. She reports more pain to the right heel today. She denies purulent drainage Or fever/chills 2/22; patient presents for follow-up. She reports taking clindamycin over the past week. She states that she continues to have pain to her right heel. She reports purulent drainage. Readmission 03/16/2022 Ms. Takari Duncombe is a 47 year old female with a past medical history of type 2 diabetes, osteomyelitis to her feet, chronic systolic heart failure and bipolar disorder that presents to the clinic for bilateral feet wounds and right lower extremity wound. She was last seen in our clinic on 12/15/2021. At that time she had purulent drainage coming out of her right plantar foot and I recommended she go to the ED. She states she went to [facility] and has been there for the past 3 months. I cannot see the records. She states she had OR debridement and was on several weeks of IV antibiotics while inpatient. Since discharge she has not been taking care of the wound beds. She had nothing on her  feet other than socks today. She currently denies signs of infection. 5/31; patient presents for follow-up. She has been using Dakin's wet-to-dry dressings to the wound beds on her feet bilaterally and antibiotic ointment to the right anterior leg wound. She had a wound culture done at last clinic visit that showed moderate Pseudomonas aeruginosa sensitive to ciprofloxacin. She currently denies systemic signs of infection. 6/14; patient presents for follow-up. She received Keystone 5 days ago and has been using this on the wound beds. She states that last week she had to go to the hospital because  she had increased warmth and erythema to the right foot. She was started on 2 oral antibiotics. She states she has been taking these. She currently denies systemic signs of infection. She has no issues or complaints today. 6/21; patient presents for follow-up. She states she has been using Keystone antibiotics to the wound beds. She has no issues or complaints today. She denies signs of infection. 6/28; patient presents for follow-up. She has been using Keystone antibiotics to the wound beds. She has no issues or complaints today. 7/12; patient presents for follow-up. Has been using Keystone antibiotics to the wound beds with calcium alginate. She has no issues or complaints today. She never followed up with her orthopedic surgeon who did the OR debridement to the right foot. We discussed the total contact cast for the left foot and patient would like to do this next week. 7/19; patient presents for follow-up. She has been using Keystone antibiotics with calcium alginate to the wound beds. She has no issues or complaints today. Patient is in agreement to do the total contact cast of the left foot today. She knows to return later this week for the obligatory cast change. 05-13-2022 upon evaluation today patient's wound which she has the cast of the left leg actually appears to be doing significantly better. Fortunately I do not see any signs of active infection locally or systemically which is great news and overall I am extremely pleased with where we stand currently. 7/26; patient presents for follow-up. She has a cast in place for the past week. She states it irritated her shin. Other than that she tolerated the cast well. She states she would like a break for 1 week from the cast. We have been using Keystone antibiotic and Aquacel to both wound beds. She denies signs of infection. 8/2; patient presents for follow-up. She has been using Keystone and Aquacel to the wound beds. She denies any issues  and has no complaints. She is agreeable to have the cast placed today for the left leg. 06-03-2022 upon evaluation today patient appears to be doing well with regard to her wound she saw some signs of improvement which is great news. Fortunately I do not see any evidence of active infection locally or systemically at this time which is great news. No fevers, chills, nausea, vomiting, or diarrhea. 8/16; patient presents for follow-up. She has no issues or complaints today. We have been using Keystone and Aquacel to the wound beds. The left lower extremity is in a total contact cast. She is tolerated this well. 8/23; patient presents for follow-up. She has had the total contact cast on the left leg for the past week. Unfortunately this has rubbed and broken down the skin to the medial foot. She currently denies signs of infection. She has been using Keystone antibiotic to the right plantar foot wound. 8/30; patient presents for follow-up. We have held off on the total  contact cast for the left leg for the past week. Her wound on the left foot has improved and the previous surrounding breakdown of skin has epithelialized. She has been using Keystone antibiotic to both wound beds. She has no issues or complaints today. She denies signs of infection. 9/6; patient presents for follow-up. She has ordered her's Keystone antibiotic and this is arriving this week. She has been doing Dakin's wet-to-dry dressings to the wound beds. She denies signs of infection. She is agreeable to the total contact cast today. 9/13; patient presents for follow-up. She states that the cast caused her left leg shin to be sore. She would like to take a break from the cast this week. She has been using Keystone antibiotic to the right plantar foot wound. She denies signs of infection. 9/20; patient presents for follow-up. She has been using Keystone antibiotics to the wound beds with calcium alginate to the right foot wound and  Hydrofera Blue to the left foot wound. She is agreeable to having the cast placed today. She has been approved for Apligraf and we will order this for next clinic visit. 9/27; patient presents for follow-up. We have been using Keystone antibiotic with Hydrofera Blue to the left foot wound under a total contact cast. T the right o foot wound she has been using Keystone antibiotic and calcium alginate. She declines a total contact cast today. Apligraf is available for placement and she would like to proceed with this. 07-28-2022 upon evaluation today patient appears to be doing well currently in regard to her wound. She is actually showing signs of significant improvement which is great news. Fortunately I do not see any evidence of active infection locally nor systemically at this time. She has been seeing Dr. Mikey Bussing and to be honest has been doing very well with the cast. Subsequently she comes in today with a cast on and we did reapply that today as well. She did not really want to she try to talk me out of that but I explained that if she wanted to heal this is really the right way to go. Patient voiced understanding. In regard to her right foot this is actually a lot better compared to the last time I saw her which is also great news. 10/11; patient presents for follow-up. Apligraf and the total contact cast was placed to the left leg at last clinic visit. She states that her right foot wound had burning pain to it with the placement of Apligraf to this area. She has been doing Olar over this area. She denies signs of infection including increased warmth, erythema or purulent drainage. 11/1; 3-week follow-up. The patient fortunately did not have a total contact cast or an Apligraf and on the left foot. She has been using Keystone ABD pads and kerlix and her own running shoes She arrives in clinic today with thick callus and a very poor surface on the left foot on the right nonviable skin  subcutaneous tissue and a deep probing hole. 11/15; patient missed her last clinic appointment. She states she has not been dressing the wound beds for the past 2 weeks. She states that at she had a new roommate but is now going back to live with her mother. Apparently its been a distracting 2 weeks. Patient currently denies signs of infection. 11/22; patient presents for follow-up. She states she has been using Keystone antibiotic and Dakin's wet-to-dry dressings to the wound beds. She is agreeable for cast placement today. We  had ordered Apligraf however this has not been received by our facility. 11/29; Patient had a total contact cast placed at last clinic visit and she tolerated this well. We were using silver alginate under the cast. Patient's been using Keystone antibiotic with Aquacel to the right plantar foot wound. She has no issues or complaints today. Apligraf is available for placement today. Patient would ANAMARIA, DUSENBURY A ([id number]) [id number] Page 8 of 20 like to proceed with this. 12/6; patient presents for follow-up. She had Apligraf placed in standard fashion last clinic visit under the total contact cast to the left lower extremity. She has been using Keystone antibiotic and Aquacel to the right plantar foot wound. She has no issues or complaints today. 12/13; patient presents for follow-up. She has finished 5 Apligraf placements. Was told she would not qualify for more. We have been doing a total contact cast to the left lower extremity. She has been using Keystone antibiotic and Aquacel to the right plantar foot wound. She has no issues or complaints today. 12/20; patient presents for follow-up. We have been using Hydrofera Blue with Keystone antibiotic under a total contact cast of the left lower extremity. She reports using Keystone antibiotic and silver alginate to the right heel wound. She has no issues or complaints today. 12/27; patient presents  with a healthy wound on the left midfoot. We have Apligraf to apply that to that more also using a total contact cast. On the right we are using Keystone and silver alginate. She is offloading the right heel with a surgical shoealthough by her admission she is on her feet quite a bit 1/3; patient presents for follow-up. Apligraf was placed to the wound beds last clinic visit. She was placed in a total contact cast to the left lower extremity. She declines a total contact cast today. She states that her mother is in the hospital and she cannot adequately get around with the cast on. 1/10; patient presents for follow-up. She declined the total contact cast at last clinic visit. Both wounds have declined in appearance. She states that she has been on her feet and not offloading the wound beds. She currently denies signs of infection. 1/17; patient presents for follow-up. She had the total contact cast along with Apligraf placed last week to the left lower extremity. She tolerated this well. She has been using Aquacel Ag and Keystone antibiotic to the right heel wound. She currently denies signs of infection. She has no issues or complaints today. 1/24; patient presents for follow-up. We have been using Apligraf to the left foot wound along with a total contact cast. She has done well with this. T the right o heel wound she has been using Aquacel Ag and Keystone antibiotic ointment. She has no issues or complaints today. She denies signs of infection. 1/31; patient presents for follow-up. We have been using Apligraf to the left foot wound along with the total contact cast. She continues to do well with this. To the right heel we have been using Aquacel Ag and Keystone antibiotic ointment. She has no issues or complaints today. 12-01-2022 upon evaluation patient is seen today on my schedule due to the fact that she unfortunately was in the hospital yesterday. Her cast needed to come off the only reason she is  out of the hospital is due to the fact that they would not take it off at the hospital which is somewhat bewildered reading to me to be perfectly honest. I am not  certain why this was but either way she was released and then was placed on my schedule today in order to get this off and reapply the total contact casting as appropriate. I do not have an Apligraf for her today it was applied last week and today's actually expired yesterday as there was some scheduling conflicts with her being in the hospital. Nonetheless we do not have that for reapplication today but the good news that she is not draining too much and the Apligraf can go for up to 2 weeks so I am going to go ahead and reapply the total contact casting but we are going to leave the Apligraf in place. 2/14; patient presents for follow-up. T the left leg she has had the total contact cast and Apligraf for the past week. She has had no issues with this. T the o o right heel she has been using antibiotic ointment and Aquacel Ag. 2/21; patient presents for follow-up. We have been using Apligraf and a total contact cast to the left lower extremity. She is tolerated this well. Unfortunately she is not approved for any more Apligraf per insurance. She has been using antibiotic ointment and Aquacel Ag to the right foot. She has no issues or complaints today. 2/28; patient presents for follow-up. We have been using Hydrofera Blue and antibiotic ointment under the total contact cast to the left lower extremity. He has been using Aquacel Ag with antibiotic ointment to the right plantar foot. She has no issues or complaints today. 3/6; patient presents for follow-up. She did not obtain her gentamicin ointment. She has been using Aquacel Ag to the right plantar foot wound. We have been using Hydrofera Blue with antibiotic ointment under the total contact cast to the left lower extremity. She has no issues or complaints today. 3/12; patient presents for  follow-up. She has been using gentamicin ointment and Aquacel Ag to the right plantar foot wound. We have been using Hydrofera Blue with antibiotic ointment under the total contact cast on the left lower extremity foot wound. She has no issues or complaints today. 3/20; patient presents for follow-up. She has been using gentamicin ointment and Aquacel Ag to the right plantar foot wound. We have been using Hydrofera Blue with antibiotic ointment under the total contact cast to the left lower extremity wound. She has no issues or complaints today. 3/27; patient presents for follow-up. We have been using antibiotic ointment and Aquacel Ag to the right plantar foot wound. We have been using Hydrofera Blue with antibiotic ointment under the total contact cast to the left lower extremity. Both wounds are smaller. 01-24-2023 upon evaluation today patient actually appears to be doing better compared to last time I saw her. It has been several weeks since I last saw her but nonetheless I think the total contact casting is doing a good job. Fortunately I do not see any evidence of infection or worsening overall which is great news and in general I do believe that she is moving in the appropriate direction. It has been a very long road but nonetheless I feel like she is nearing the end at least in regard to the left foot and even the right foot looks much better to be perfectly honest. 01-31-2023 upon evaluation today patient appears to be doing well currently in regard to her wounds. She has been require some sharp debridement which I think is probably bone both sides to remove a lot of callus that has built up at  this point. I discussed that with her today. Also think total contact cast is doing well for the left foot Emina continue that as well. 4/17; patient presents for follow-up. We have been doing Hydrofera Blue with antibiotic ointment under the total contact cast to the left lower extremity and Aquacel Ag  with antibiotic ointment to the right foot wound. She has been using her offloading heel shoe here. Electronic Signature(s) Signed: 02/08/2023 11:12:29 AM By: Geralyn Corwin DO Entered By: Geralyn Corwin on 02/08/2023 10:54:27 -------------------------------------------------------------------------------- Physical Exam Details Patient Name: Date of Service: [name] 02/08/2023 10:00 A M Medical Record Number: [id number] Patient Account Number: [id number] Date of Birth/Sex: Treating RN: December 08, 1975 (47 y.o. [name], [name] Primary Care Provider: [name] Other Clinician: Referring Provider: Treating Provider/Extender: Chapman Moss in Treatment: [address], Shanese A ([id number]) [id number] Page 9 of 20 Constitutional . Cardiovascular . Psychiatric . Notes Right foot: T the plantar heel there is an incision site with increased depth, No probing to bone. T the opening there is Nonviable tissue and granulation tissue. o o Left foot: T the medial aspect there is an open wound with granulation tissue, Callus and slough build up. No overt signs of infection to any of the wound o beds. Electronic Signature(s) Signed: 02/08/2023 11:12:29 AM By: Geralyn Corwin DO Entered By: Geralyn Corwin on 02/08/2023 10:57:08 -------------------------------------------------------------------------------- Physician Orders Details Patient Name: Date of Service: [name] 02/08/2023 10:00 A M Medical Record Number: [id number] Patient Account Number: [id number] Date of Birth/Sex: Treating RN: [birth year] (47 y.o. [name], [name] Primary Care Provider: [name] Other Clinician: Referring Provider: Treating Provider/Extender: Chapman Moss in Treatment: 70 Verbal / Phone Orders: No Diagnosis Coding Follow-up Appointments Return Appointment in 1 week. Nurse Visit as needed Bathing/ Shower/  Hygiene May shower with wound dressing protected with water repellent cover or cast protector. No tub bath. Anesthetic (Use 'Patient Medications' Section for Anesthetic Order Entry) Lidocaine applied to wound bed Edema Control - Lymphedema / Segmental Compressive Device / Other Elevate, Exercise Daily and A void Standing for Long Periods of Time. Elevate legs to the level of the heart and pump ankles as often as possible Elevate leg(s) parallel to the floor when sitting. Off-Loading Total Contact Cast to Left Lower Extremity Open toe surgical shoe - Heel off loader - Right foot Other: - keep pressure off of feet. Additional Orders / Instructions Follow Nutritious Diet and Increase Protein Intake Other: - Stay off of foot, wheelchair only Wound Treatment Wound #11 - Foot Wound Laterality: Plantar, Right Cleanser: Wound Cleanser 1 x Per Day/30 Days Discharge Instructions: Wash your hands with soap and water. Remove old dressing, discard into plastic bag and place into trash. Cleanse the wound with Wound Cleanser prior to applying a clean dressing using gauze sponges, not tissues or cotton balls. Do not scrub or use excessive force. Pat dry using gauze sponges, not tissue or cotton balls. Topical: Gentamicin 1 x Per Day/30 Days Discharge Instructions: Apply as directed by provider. in clinic Prim Dressing: Aquacel Extra Hydrofiber Dressing, 2x2 (in/in) ary 1 x Per Day/30 Days Secondary Dressing: ABD Pad 5x9 (in/in) (Generic) 1 x Per Day/30 Days Discharge Instructions: Cover with ABD pad Secured With: Medipore T - 60M Medipore H Soft Cloth Surgical T ape ape, 2x2 (in/yd) 1 x Per Day/30 Days Secured With: Kerlix Roll Sterile or Non-Sterile 6-ply 4.5x4 (yd/yd) 1 x Per Day/30 Days Discharge Instructions: Apply Kerlix as directed Brassell, Donique  A ([id number]) [id number] Page 10 of 20 Wound #12 - Foot Wound Laterality: Plantar, Left, Medial Cleanser: Wound Cleanser  1 x Per Week/30 Days Discharge Instructions: Wash your hands with soap and water. Remove old dressing, discard into plastic bag and place into trash. Cleanse the wound with Wound Cleanser prior to applying a clean dressing using gauze sponges, not tissues or cotton balls. Do not scrub or use excessive force. Pat dry using gauze sponges, not tissue or cotton balls. Topical: Gentamicin 1 x Per Week/30 Days Discharge Instructions: Apply as directed by provider. in clinic Prim Dressing: Hydrofera Blue Ready Transfer Foam, 2.5x2.5 (in/in) 1 x Per Week/30 Days ary Discharge Instructions: Apply Hydrofera Blue Ready to wound bed as directed Secondary Dressing: ABD Pad 5x9 (in/in) (Generic) 1 x Per Week/30 Days Discharge Instructions: Cover with ABD pad Secured With: Medipore T - 57M Medipore H Soft Cloth Surgical T ape ape, 2x2 (in/yd) 1 x Per Week/30 Days Secured With: [employer] or Non-Sterile 6-ply 4.5x4 (yd/yd) 1 x Per Week/30 Days Discharge Instructions: Apply Kerlix as directed Electronic Signature(s) Signed: 02/08/2023 11:12:29 AM By: Geralyn Corwin DO Entered By: Geralyn Corwin on 02/08/2023 10:58:53 -------------------------------------------------------------------------------- Problem List Details Patient Name: Date of Service: [name] 02/08/2023 10:00 A M Medical Record Number: [id number] Patient Account Number: [id number] Date of Birth/Sex: Treating RN: 1976/06/03 (46 y.o. 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 03/16/2022 No Yes E11.621 Type 2 diabetes  mellitus with foot ulcer 03/16/2022 No Yes E11.42 Type 2 diabetes mellitus with diabetic polyneuropathy 03/16/2022 No Yes L97.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 03/16/2022 No Yes of skin Inactive Problems Resolved Problems Electronic Signature(s) Signed: 02/08/2023 11:12:29 AM By: Kerrin Champagne, Adajah A ([id number]) [id number] Page 11 of 20 Entered By: Geralyn Corwin on 02/08/2023 10:53:07 -------------------------------------------------------------------------------- Progress Note Details Patient Name: Date of Service: BONNEE, ZERTUCHE 02/08/2023 10:00 A M Medical Record Number: [id number] Patient Account Number: [id number] Date of Birth/Sex: Treating RN: 10/02/1976 (47 y.o. [name], [name] Primary Care Provider: [name] Other Clinician: Referring Provider: Treating Provider/Extender: Chapman Moss in Treatment: 55 Subjective Chief Complaint Information obtained from Patient 03/19/2021; patient referred by Dr. Excell Seltzer who has been looking after her left foot for quite a period of time for review of a nonhealing area in the left midfoot 03/12/2022; bilateral feet wounds and right lower extremity wound. History of Present Illness (HPI) 01/18/18-She is here for initial evaluation of the left great toe ulcer. She is a poor historian in regards to timeframe in detail. She states approximately 4 weeks ago she lacerated her toe on something in the house. She followed up with her primary care who placed her on Bactrim and ultimately a second dose of Bactrim prior to coming to wound clinic. She states she has been treating the toe with peroxide, Betadine and a Band-Aid. She did not check her blood sugar this morning but checked it yesterday morning it was 327; she is unaware of a recent A1c and there are no current records. She saw Dr. she would've orthopedics last week for an  old injury to the  left ankle, she states he did not see her toe, nor did she bring it to his attention. She smokes approximately 1 pack cigarettes a day. Her social situation is concerning, she arrives this morning with her mother who appears extremely intoxicated/under the influence; her mother was asked to leave the room and be monitored by the patient's grandmother. The patient's aunt then accompanied the patient and the room throughout the rest of the appointment. We had a lengthy discussion regarding the deleterious effects of uncontrolled hyperglycemia and smoking as it relates to wound healing and overall health. She was strongly encouraged to decrease her smoking and get her diabetes under better control. She states she is currently on a diet and has cut down her [facility] consumption. The left toe is erythematous, macerated and slightly edematous with malodor present. The edema in her left foot is below her baseline, there is no erythema streaking. We will treat her with Santyl, doxycycline; we have ordered and xray, culture and provided a Peg assist surgical shoe and cultured the wound. 01/25/18-She is here in follow-up evaluation for a left great toe ulcer and presents with an abscess to her suprapubic area. She states her blood sugars remain elevated, feeling "sick" and if levels are below 250, but she is trying. She has made no attempt to decrease her smoking stating that we "can't take away her food in her cigarettes". She has been compliant with offloading using the PEG assist you. She is using Santyl daily. the culture obtained last week grew staph aureus and Enterococcus faecalis; continues on the doxycycline and Augmentin was added on Monday. The suprapubic area has erythema, no femoral variation, purple discoloration, minimal induration, was accessed with a cotton tip applicator with sanguinopurulent drainage, this was cultured, I suspect the current antibiotic treatment will cover  and we will not add anything to her current treatment plan. She was advised to go to urgent care or ER with any change in redness, induration or fever. 02/01/18-She is here in follow-up evaluation for left great toe ulcers and a new abdominal abscess from last week. She was able to use packing until earlier this week, where she "forgot it was there". She states she was feeling ill with GI symptoms last week and was not taking her antibiotic. She states her glucose levels have been predominantly less than 200, with occasional levels between 200-250. She thinks this was contributing to her GI symptoms as they have resolved without intervention. There continues to be significant laceration to left toe, otherwise it clinically looks stable/improved. There is now less superficial opening to the lateral aspect of the great toe that was residual blister. We will transition to [facility] to all wounds, she will continue her Augmentin. If there is no change or deterioration next week for reculture. 02/08/18-She is here in follow-up evaluation for left great toe ulcer and abdominal ulcer. There is an improvement in both wounds. She has been wrapping her left toe with coban, not by our direction, which has created an area of discoloration to the medial aspect; she has been advised to NOT use coban secondary to her neuropathy. She states her glucose levels have been high over this last week ranging from 200-350, she continues to smoke. She admits to being less compliant with her offloading shoe. We will continue with same treatment plan and she will follow-up next week. 02/15/18-She is here in follow-up evaluation for left great toe ulcer and abdominal ulcer. The abdominal ulcer is epithelialized. The left  great toe ulcer is improved and all injury from last week using the Coban wrap is resolved, the lateral ulcer is healed. She admits to noncompliance with wearing offloading shoe and admits to glucose levels  being greater than 300 most of the week. She continues to smoke and expresses no desire to quit. There is one area medially that probes deeper than it has historically, erythema to the toe and dorsal foot has consistently waxed and waned. There is no overt signs of cellulitis or infection but we will culture the wound for any occult infection given the new area of depth and erythema. We will hold off on sensitivities for initiation of antibiotic therapy. 02/22/18-She is here in follow up evaluation for left great toe ulcer. There is overall significant improvement in both wound appearance, erythema and edema with changes made last week. She was not initiated on antibiotic therapy. Culture obtained last week showed oxacillin sensitive staph aureus, sensitive to clindamycin. Clindamycin has been called into the pharmacy but she has been instructed to hold off on initiation secondary to overall clinical improvement and her history of antibiotic intolerance. She has been instructed to contact the clinic with any noted changes/deterioration and the wound, erythema, edema and/or pain. She will follow-up next week. She continues to smoke and her glucose levels remain elevated >250; she admits to compliance with offloading shoe 03/01/18 on evaluation today patient appears to be doing fairly well in regard to her left first toe ulcer. She has been tolerating the dressing changes with the [facility] Dressing without complication and overall this has definitely showed signs of improvement according to records as well is what the patient tells me today. I'm very pleased in that regard. She is having no pain today 03/08/18 She is here for follow up evaluation of a left great toe ulcer. She remains non-compliant with glucose control and smoking cessation; glucose levels consistently >200. She states that she got new shoe inserts/peg assist. She admits to compliance with offloading. Since my last evaluation there is  significant improvement. We will switch to prisma at this time and she will follow up next week. She is noted to be tachycardic at this appointment, heart rate 120s; she has a history of heart rate 70-130 according to our records. She admits to extreme agitation r/t personal issues; she was advised to monitor her heartrate and contact her physician if it does not return to a more normal range (<100). She takes cardizem twice daily. 03/15/18-She is here in follow-up evaluation for left great toe ulcer. She remains noncompliant with glucose control and smoking cessation. She admits to compliance with wearing offloading shoe. The ulcer is improved/stable and we will continue with the same treatment plan and she will follow-up next week 03/22/18-She is here for evaluation for left great toe ulcer. There continues to be significant improvement despite recurrent hyperglycemia (over 500 yesterday) and she continues to smoke. She has been compliant with offloading and we will continue with same treatment plan and she will follow-up next week. 03/29/18-She is here for evaluation for left great toe ulcer. Despite continuing to smoke and uncontrolled diabetes she continues to improve. She is compliant with offloading shoe. We will continue with the same treatment plan and she will follow-up next week 04/05/18- She is here in follow up evaluation for a left great toe ulcer; she presents with small pustule to left fifth toe (resembles ant bite). She admits to compliance with wearing offloading shoe; continues to smoke or have uncontrolled  blood glucose control. There is more callus than usual with evidence of bleeding; she denies known trauma. 04/12/18-She is here for evaluation of left great toe ulcer. Despite noncompliance with glycemic control and smoking she continues to make improvement. She continues to wear offloading shoe. The pustule, that was identified last week, to the left fifth toe is resolved. She will  follow-up in 2 weeks 05/03/18-she is seen in follow-up evaluation for a left great toe ulcer. She is compliant with offloading, otherwise noncompliant with glycemic control and smoking. She has plateaued and there is minimal improvement noted. We will transition to [facility], replaced the insert to her surgical shoe and she will follow-up in one week 05/10/18- She is here in follow up evaluation for a left great toe ulcer. It appears stable despite measurement change. We will continue with same treatment plan and follow up next week. KEGAN, SHEPARDSON A ([id number]) [id number] Page 12 of 20 05/24/18-She is seen in follow-up evaluation for a left great toe ulcer. She remains compliant with offloading, has made significant improvement in her diet, decreasing the amount of sugar/soda. She said her recent A1c was 10.9 which is lower than. She did see a diabetic nutritionist/educator yesterday. She continues to smoke. We will continue with the same treatment plan and she'll follow-up next week. 05/31/18- She is seen in follow-up evaluation for left great toe ulcer. She continues to remain compliant with offloading, continues to make improvement in her diet, increasing her water and decreasing the amount of sugar/soda. She does continue to smoke with no desire to quit. We will apply Prisma to the depth and Hydrofera Blue over. We have not received insurance authorization for oasis. She will follow up next week. 06/07/18-She is seen in follow-up evaluation for left great toe ulcer. It has stalled according to today's measurements although base appears stable. She says she saw a diabetic educator yesterday; her average blood sugars are less than 300 which is an improvement for her. She continues to smoke and states "that's my next step" She continues with water over soda. We will order for xray, culture and reinstate ace wrap compression prior to placing apligraf for next week. She is  voicing no complaints or concerns. Her dressing will change to iodoflex over the next week in preparation for apligraf. 06/14/18-She is seen in follow-up evaluation for left great toe ulcer. Plain film x-ray performed last week was negative for osteomyelitis. Wound culture obtained last week grew strep B and OSSA; she is initiated on keflex and cefdinir today; there is erythema to the toe which could be from ace wrap compression, she has a history of wrapping too tight and has has been encouraged to maintain ace wraps that we place today. We will hold off on application of apligraf today, will apply next week after antibiotic therapy has been initiated. She admits today that she has resumed taking a shower with her foot/toe submerged in water, she has been reminded to keep foot/toe out of the bath water. She will be seen in follow up next week 06/21/18-she is seen in follow-up evaluation for left great toe ulcer. She is tolerating antibiotic therapy with no GI disturbance. The wound is stable. Apligraf was applied today. She has been decreasing her smoking, only had 4 cigarettes yesterday and 1 today. She continues being more compliant in diabetic diet. She will follow-up next week for evaluation of site, if stable will remove at 2 weeks. 06/28/18- She is here in follow up evalution. Apligraf was placed  last week, she states the dressing fell off on Tuesday and she was dressing with hydrofera blue. She is healed and will be discharged from the clinic today. She has been instructed to continue with smoking cessation, continue monitoring glucose levels, offloading for an additional 4 weeks and continue with hydrofera blue for additional two weeks for any possible microscopic opening. Readmission: 08/07/18 on evaluation today patient presents for reevaluation concerning the ulcer of her right great toe. She was previously discharged on 06/28/18 healed. Nonetheless she states that this began to show signs of  drainage she subsequently went to her primary care provider. Subsequently an x-ray was performed on 08/01/18 which was negative. The patient was also placed on antibiotics at that time. Fortunately they should have been effective for the infection. Nonetheless she's been experiencing some improvement but still has a lot of drainage coming from the wound itself. 08/14/18 on evaluation today patient's wound actually does show signs of improvement in regard to the erythema at this point. She has completed the antibiotics. With that being said we did discuss the possibility of placing her in a total contact cast as of today although I think that I may want to give this just a little bit more time to ensure nothing recurrence as far as her infection is concerned. I do not want to put in the cast and risk infection at that time if things are not completely resolved. With that being said she is gonna require some debridement today. 08/21/18 on evaluation today patient actually appears to be doing okay in regard to her toe ulcer. She's been tolerating the dressing changes without complication. With that being said it does appear that she is ready and in fact I think it's appropriate for [place] to go ahead and initiate the total contact cast today. Nonetheless she will require some sharp debridement to prepare the wound for application. Overall I feel like things have been progressing well but we do need to do something to get this to close more readily. 08/24/18 patient seen today for reevaluation after having had the total contact cast applied on Tuesday. She seems to have done very well the wound appears to be doing great and overall I'm pleased with the progress that she's made. There were no abnormal areas of rubbing from the cast on her lower extremity. 08/30/18 on evaluation today patient actually appears to be completely healed in regard to her plantar toe ulcer. She tells me at this point she's been having a  lot of issues with the cast. She almost fell a couple of times the state shall the step of her dog a couple times as well. This is been a very frustrating process for her other nonetheless she has completely healed the wound which is excellent news. Overall there does not appear to be the evidence of infection at this time which is great news. 09/11/18 evaluation today patient presents for follow-up concerning her great toe ulcer on the left which has unfortunately reopened since I last saw her which was only a couple of weeks ago. Unfortunately she was not able to get in to get the shoe and potentially the AFO that's gonna be necessary due to her left foot drop. She continues with offloading shoe but this is not enough to prevent her from reopening it appears. When we last had her in the total contact cast she did well from a healing standpoint but unfortunately the wound reopened as soon as she came out of the  cast within just a couple of weeks. 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 This is also part of the reason why she falls on a regular basis. I just do not believe that is gonna be safe for [place] to reinitiate the total contact cast as last time we had this on she fell 3 times one week which is definitely not normal for her. 09/18/18 upon evaluation today the patient actually appears to be doing about the same in regard to her toe ulcer. She did not contact [employer] as I asked her to even though I had given her the prescription. In fact she actually states that she has no idea where the prescription is. She did apparently call [employer] and they told her that all she needed to do was bring the prescription in order to be able to be seen and work on getting the AFO for her left foot. With all that  being said she still does not have an appointment and I'm not sure were things stand that regard. I will give her a new prescription today in order to contact them to get this set up. 09/25/18 on evaluation today patient actually appears to be doing about the same in regard to her toes ulcer. She does have a small areas which seems to have a lot of callous buildup around the edge of the wound which is going to need sharp debridement today. She still is waiting to be scheduled for evaluation with [employer] for possibility of an AFO. She states there supposed to call her tomorrow to get this set up. Unfortunately it does appear that her foot specifically the toe area is showing signs of erythema. There does not appear to be any systemic infection which is in these good news. 10/02/18 on evaluation today patient actually appears to be doing about the same in regard to her toe ulcer. This really has not done too well although it's not significantly larger it's also not significantly smaller. She has been tolerating the dressing changes without complication. She actually has her appointment with [employer] and [facility] tomorrow to hopefully be measured for obtaining and AFO splint. I think this would be helpful preventing this from reoccurring. We had contemplated starting the cast this week although to be honest I am reluctant to do that as she's been having nausea, vomiting, and seizure activity over the past three days. She has a history of seizures and have been told is nothing that can be done for these. With that being said I do believe that along with the seizures have the nausea vomiting which upon further questioning doesn't seem to be the normal for her and makes me concerned for the possibility of infection or something else going on. I discussed this with the patient and her mother during the office visit today. I do not feel the wound is effective but maybe something else. The responses this was  "this just happens to her at times and we don't know why". They did not seem to be interested in going to the hospital to have this checked out further. 10/09/18 on evaluation today patient presents for follow-up concerning her ongoing toe ulcer. She has been tolerating the dressing changes without complication. Fortunately there does not appear to be any evidence  of infection which is great news however I do think that the patient would benefit from going ahead for with the total contact cast. She's actually in a wheelchair today she tells me that she will use her walker if we initiate the cast. I was very specific about the fact that if we were gonna do the cast I wanted to make sure that she was using the walker in order to prevent any falls. She tells me she does not have stairs that she has to traverse on a regular basis at her home. She has not had any seizures since last week again that something that happens to her often she tells me she did talk to [employer] and they said that it may take up to three weeks to get the brace approved for her. Hopefully that will not take that long but nonetheless in the meantime I do think the cast could be of benefit. 10/12/18 on evaluation today patient appears to be doing rather well in regard to her toe ulcer. It's just been a few days and already this is significantly improved both as far as overall appearance and size. Fortunately there's no sign of infection. She is here for her first obligatory cast change. 10/19/18 Seen today for follow up and management of left great toe ulcer. Wound continues to show improvement. Noted small open area with seroussang drainage with palpation. Denies any increased pain or recent fevers during visit. She will continue calcium alginate with offloading shoe. Denies any questions or concerns during visit. JAIMEE, CORUM A ([id number]) [id number] Page 13 of 20 10/26/18 on evaluation today patient  appears to be doing about the same as when I last saw her in regard to her wound bed. Fortunately there does not appear to be any signs of infection. Unfortunately she continues to have a breakdown in regard to the toe region any time that she is not in the cast. It takes almost no time at all for this to happen. Nonetheless she still has not heard anything from the brace being made by [employer] as to when exactly this will be available to her. Fortunately there is no signs of infection at this time. 10/30/18 on evaluation today patient presents for application of the total contact cast as we just received him this morning. Fortunately we are gonna be able to apply this to her today which is great news. She continues to have no significant pain which is good news. Overall I do feel like things have been improving while she was the cast is when she doesn't have a cast that things get worse. She still has not really heard anything from [employer] regarding her brace. 11/02/18 upon evaluation today patient's wound already appears to be doing significantly better which is good news. Fortunately there does not appear to be any signs of infection also good news. Overall I do think the total contact cast as before is helping to heal this area unfortunately it's just not gonna likely keep the area closed and healed without her getting her brace at least. Again the foot drop is a significant issue for her. 11/09/18 on evaluation today patient appears to be doing excellent in regard to her toe ulcer which in fact is completely healed. Fortunately we finally got the situation squared away with the paperwork which was needed to proceed with getting her brace approved by Medicaid. I have filled that out unfortunately that information has been sent to the orthopedic office that I worked at 2  1/2 years ago and not tired Current wound care measures. Fortunately she seems to be doing very well at this time. 11/23/18 on  evaluation today patient appears to be doing More Poorly Compared to Last Time I Saw Her. At [facility] She Had Completely Healed. Currently she is continuing to have issues with reopening. She states that she just found out that the brace was approved through Medicaid now she just has to go get measured in order to have this fitted for her and then made. Subsequently she does not have an appointment for this yet that is going to complicate things we obviously cannot put her back in the cast if we do not have everything measured because they're not gonna be able to measure her foot while she is in the cast. 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 Obviously this is unfortunate and does have me somewhat worried as well. 11/30/18 on evaluation today patient's toe ulcer actually appears to be doing fairly well. The good news is she will be getting her brace in the shoes next week on Wednesday. Hopefully we will be able to get this to heal without having to go back in the cast however she may need the cast in order to get the wound completely heal and then go from there. Fortunately there's no signs of infection at this time. 12/07/18 on evaluation today patient fortunately did receive her brace and she states she could tell this definitely makes her walk better. With that being said she's been having issues with her toe where she noticed yesterday there was a lot of tissue that was loosing off this appears to be much larger than what it was previous. She also states that her leg has been read putting much across the top of her foot just about the ankle although this seems to be receiving somewhat. The total area is still red and appears to be someone infected as best I can tell. She is previously taken Bactrim and that may be a good option for her today as well. We are gonna see what I wound culture shows as  well and I think that this is definitely appropriate. With that being said outside of the culture I still need to initiate something in the interim and that's what I'm gonna go ahead and select Bactrim is a good option for her. 12/14/18 on evaluation today patient appears to be doing better in regard to her left great toe ulcer as compared to last week's evaluation. There's still some erythema although this is significantly improved which is excellent news. Overall I do believe that she is making good progress is still gonna take some time before she is where I would like her to be from the standpoint of being able to place her back into the total contact cast. Hopefully we will be where we need to be by next week. 12/21/18 on evaluation today patient actually appears to be doing poorly in regard to her toe ulcer. She's been tolerating the dressing changes without complication. Fortunately there's no signs of systemic infection although she does have a lot of drainage from the toe ulcer and this does seem to be causing some issues at this point. She does have erythema on the distal portion of her toe that appears to be likely cellulitis. 12/28/18 on evaluation today patient actually appears to be doing a  little better in my pinion in regard to her toe ulcer. With that being said she still does have some evidence of infection at this time and for her culture she had both E. coli as well as enterococcus as organisms noted on evaluation. For that reason I think that though the Keflex likely has treated the E. coli rather well this has really done nothing for the enterococcus. We are going to have to initiate treatment for this specifically. 01/04/19 on evaluation today patient's toe actually appears to be doing better from the standpoint of infection. She currently would like to see about putting the cash back on I think that this is appropriate as long as she takes care of it and keeps it from getting wet.  She is gonna have some drainage we can definitely pass this up with Drawtex and alginate to try to prevent as much drainage as possible from causing the problems. With that being said I do want to at least try her with the cast between now and Tuesday. If there any issues we can't continue to use it then I will discontinue the use of the cast at that point. 01/08/19 on evaluation today patient actually appears to be doing very well as far as her foot ulcer specifically the great toe on the left is concerned. She did have an area of rubbing on the medial aspect of her left ankle which again is from the cast. Fortunately there's no signs of infection at this point in this appears to be a very slight skin breakdown. The patient tells me she felt it rubbing but didn't think it was that bad. Fortunately there is no signs of active infection at this time which is good news. No fevers, chills, nausea, or vomiting noted at this time. 01/15/19 on evaluation today patient actually appears to be doing well in regard to her toe ulcer. Again as previous she seems to do well and she has the cast on which indicates to me that during the time she doesn't have a cast on she's putting way too much pressure on this region. Obviously I think that's gonna be an issue as with the current national emergency concerning the Covid-19 Virus it has been recommended that we discontinue the use of total contact casting by the chief medical officer of our company, Dr. Maurine Minister. The reasoning is that if a patient becomes sick and cannot come into have the cast removed they could not just leave this on for an additional two weeks. Obviously the hospitals also do not want to receive patient's who are sick into the emergency department to potentially contaminate the region and spread the Covid-19 Virus among other sick individuals within the hospital system. Therefore at this point we are suspending the use of total contact cast until the  current emergency subsides. This was all discussed with the patient today as well. 01/22/19 on evaluation today patient's wound on her left great toe appears to be doing slightly worse than previously noted last week. She tells me that she has been on this quite a bit in fact she tells me she's been awake for 38 straight hours. This is due to the fact that she's having to care for grandparents because nobody else will. She has been taking care of them for five the last seven days since I've seen her they both have dementia his is from a stroke and her grandmother's was progressive. Nonetheless she states even her mom who knows her condition and  situation has only help two of those days to take care of them she's been taking care of the rest. Fortunately there does not appear to be any signs of active infection in regard to her toe at this point although obviously it doesn't look as good as it did previous. I think this is directly related to her not taking off the pressure and friction by way of taking things easy. Though I completely understand what's going on. 01/29/19 on evaluation today patient's tools are actually appears to be showing some signs of improvement today compared to last week's evaluation as far as not necessarily the overall size of the wound but the fact that she has some new skin growth in between the two ends of the wound opening. Overall I feel like she has done well she states that she had a family member give her what sounds to be a CAM walker boot which has been helpful as well. 02/05/19 on evaluation today patient's wound bed actually appears to be doing significantly better in regard to her overall appearance of the size of the wound. With that being said she is still having an issue with offloading efficiently enough to get this to close. Apparently there is some signs of infection at this point as well unfortunately. Previously she's done well of Augmentin I really do not see  anything that needs to be culture currently but there theme and cellulitis of the foot that I'm seeing I'm gonna go ahead and place her on an antibiotic today to try to help clear this up. 02/12/2019 on evaluation today patient actually appears to be doing poorly in regard to her overall wound status. She tells me she has been using her offloading shoe but actually comes in today wearing her tennis shoe with the AFO brace. Again as I previously discussed with her this is really not sufficient to allow the area to heal appropriately. Nonetheless she continues to be somewhat noncompliant and I do wonder based on what she has told my nurse in the past as to whether or not a good portion of this noncompliance may be recreational drug and alcohol related. She has had a history of heroin overdose and this was fairly recently in the past couple of months that have been seeing her. Nonetheless overall I feel like her wound looks significantly worse today compared to what it was previous. She still has significant erythema despite the Augmentin I am not sure that this is an appropriate medication for her infection I am also concerned that the infection may have gone down into her bone. SHAMEL, GALYEAN A ([id number]) [id number] Page 14 of 20 02/19/19 on evaluation today patient actually appears to be doing about the same in regard to her toe ulcer. Unfortunately she continues to show signs of bone exposure and infection at this point. There does not appear to be any evidence of worsening of the infection but I'm also not really sure that it's getting significantly better. She is on the Augmentin which should be sufficient for the Staphylococcus aureus infection that she has at this point. With that being said she may need IV antibiotics to more appropriately treat this. We did have a discussion today about hyperbaric option therapy. 02/28/19 on evaluation today patient actually appears to  be doing much worse in regard to the wound on her left great toe as compared to even my previous evaluation last week. Unfortunately this seems to be training in a pretty poor direction. Her toe  was actually now starting to angle laterally and I can actually see the entire joint area of the proximal portion of the digit where is the distal portion of the digit again is no longer even in contact with the joint line. Unfortunately there's a lot more necrotic tissue around the edge and the toe appears to be showing signs of becoming gangrenous in my pinion. I'm very concerned about were things stand at this point. She did see infectious disease and they are planning to send in a prescription for Sivextro for her and apparently this has been approved. With that being said I don't think she should avoid taking this but at the same time I'm not sure that it's gonna be sufficient to save her toe at this point. 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 03/05/19 on evaluation today patient actually appears to be doing quite well in regard to her toe all things considering. She still has bone exposed but there appears to be much less your thing on overall the appearance of the wound and the toe itself is dramatically improved. She still does have some issues currently obviously with infection she did see vascular as well and there concerned that her blood flow to the toad. For that reason they are setting up for an angiogram next week. 03/14/19 on evaluation today patient appears to be doing very poor in regard to her toe and specifically in regard to the ulceration and the fact that she's starting to notice the toe was leaning even more towards the lateral aspect and the complete joint is visible on the  proximal aspect of the joint. Nonetheless she's also noted a significant odor and the tip of the toe is turning more dark and necrotic appearing. Overall I think she is getting worse not better as far as this is concerned. For that reason I am recommending at this point that she likely needs to be seen for likely amputation. READMISSION 03/19/2021 This is a patient that we cared for in this clinic for a prolonged period of time in 2019 and 2020 with a left foot and left first toe wound. 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 Excell Seltzer. In December 2021 she had an ulcer on her right great toe as well as the fourth and fifth toes. 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 She also had an angiogram at that time and underwent angioplasty of the right anterior tibial artery. In any case she claims that the wound on the right foot is closed I did not look at this today which was probably an oversight although I think that should be done next week. After her surgery she developed a dehiscence but I do not see any follow-up of this. According to Dr. Bernette Redbird last review that she was out of the area being cared for by another physician but recently came back to his attention. 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 A culture of this area showed E. coli apparently before she came back to see Dr. Excell Seltzer she was supposed to be receiving antibiotics but she did not really  take them. Nor is she offloading this area at all. Finally her last hemoglobin A1c listed in epic was in March 2022 at 14.1 she says things are a lot better since then although I am not sure. 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 She had this wound at the time. During this admission she had  E. coli sepsis a TEE was negative. 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 She is still a heavy cigarette smoker. She has a history of bipolar disorder seizure disorder ABI in our clinic was 1.05 6/1; left midfoot in the setting of a TMA done previously. Round circular wound with a "knuckle" of protruding tissue. The problem is that the knuckle was not attached to any of the surrounding granulation and this probed proximally widely I removed a large portion of this tissue. This wound goes with considerable undermining laterally. I do not feel any bone there was no purulence but this is a deep wound. 6/8; in spite of the debridement I did last week. She arrives with a wound looking exactly the same. A protruding "knuckle" of tissue nonadherent to most of the surrounding tissue. There is considerable depth around this from 6-12 o'clock at 2.7 cm and undermining of 1 cm. This does not look overtly infected and the x- ray I did last week was negative for any osseous abnormalities. We have been using silver collagen 6/15; deep tissue culture I did last week showed moderate staph aureus and moderate Pseudomonas. This will definitely require prolonged antibiotic therapy. The pathology on the protuberant area was negative for malignancy fungus etc. 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 We have been using silver collagen. I am going to be prescribing Levaquin for 2 weeks. Her CT scan of the foot is down for 7/5 6/22; CT scan of the foot on 7 5. She says she has hardware in the left leg from her previous fracture. She is on the Levaquin for the deep tissue culture I did that showed methicillin  sensitive staph aureus and Pseudomonas. I gave her a 2-week supply and she will have another week. She arrives in clinic today with the same protuberant tissue however this is nonadherent to the tissue surrounding it. I am really at a loss to explain this unless there is underlying deep tissue infection 6/29; patient presents for 1 week follow-up. She has been using collagen to the wound bed. She reports taking her antibiotics as prescribed.She has no complaints or issues today. She denies signs of infection. 7/6; patient presents for one week followup. She has been using collagen to the wound bed. She states she is taking Levaquin however at times she is not able to keep it down. She denies signs of infection. 7/13; patient presents for 1 week follow-up. She has been using silver alginate to the wound bed. She still has nausea when taking her antibiotics. She denies signs of infection. 7/20; patient presents for 1 week follow-up. She has been using silver alginate with gentamicin cream to the wound bed. She denies any issues and has no complaints today. She denies signs of infection. 7/27; patient presents for 1 week follow-up. She continues to use silver alginate with  gentamicin cream to the wound bed. She reports starting her antibiotics. She has no issues or complaints. Overall she reports stability to the wound. 8/3; patient presents for 1 week follow-up. She has been using silver alginate with gentamicin cream to the wound bed. She reports completing all antibiotics. She has no issues or complaints today. She denies signs of infection. 8/17; patient presents for 2-week follow-up. He is to use silver alginate to the wound bed. She has no issues or complaints today. She denies signs of infection. She reports her pain has improved in her foot since last clinic visit 8/24; patient presents for 1 week follow-up. She continues to use silver alginate to the wound bed. She has no issues or complaints.  She denies signs of infection. Pain is stable. 9/7; patient presents for follow-up. She missed her last week appointment due to feeling ill. She continues to use silver alginate. She has a new wound to the right lower extremity that is covered in eschar. She states It occurred over the past week and has no idea how it started. She currently denies signs of infection. 9/14; patient presents for follow-up. T the left foot wound she has been using gentamicin cream and silver alginate. T the right lower extremity wound she has o o been keeping this covered and has not obtain Santyl. 9/21; patient presents for follow-up. She reports using gentamicin cream and silver alginate to the left foot and Santyl to the right lower extremity wound. She has no issues or complaints today. She denies signs of infection. 9/28; patient presents for follow-up. She reports a new wound to her right heel. She states this occurred a few days ago and is progressively gotten worse. She has been trying to clean the area with a Q-tip and Santyl. She reports stability in the other 2 wounds. She has been using gentamicin cream and silver alginate to the left foot and Santyl to the right lower extremity wound. 10/12; patient presents for follow-up. She reports improvement to the wound beds. She is seeing vein and vascular to discuss the potential of a left BKA. She states they are going to do an arteriogram. She continues to use silver alginate with dressing changes to her wounds. 11/2; patient presents for follow-up. She states she has not been doing dressing changes to the wound beds. She states she is not able to offload the areas. TERRISHA, LOPATA A ([id number]) [id number] Page 15 of 20 She reports chronic pain to her left foot wound. 11/9; patient presents for follow-up. She came in with only socks on. She states she forgot to put on shoes. It is unclear if she is doing any dressing changes. She  currently denies systemic signs of infection. 11/16; patient presents for follow-up. She came again only with socks on. She states she does not wear shoes ever. It is unclear if she does dressing changes. She currently denies systemic signs of infection. 11/23; patient presents for follow-up. She wore her shoes today. It still unclear exactly what dressing she is using for each wound but she did states she obtained Dakin's solution and has been using this to the left foot wound. She currently denies signs of infection. 11/30; patient presents for follow-up. She has no issues or complaints today. She currently denies signs of infection. 12/7; patient presents for follow-up. She has no issues or complaints today. She has been using Hydrofera Blue to the right heel wound and Dakin solution to the left foot wound. Her right anterior leg  wound is healed. She currently denies signs of infection. 12/14; patient presents for follow-up. She has been using Hydrofera Blue to the right heel and Dakin's to the left foot wounds. She has no issues or complaints today. She denies signs of infection. 12/21; patient presents for follow-up. She reports using Hydrofera Blue to the right heel and Dakin's to the left foot wound. She denies signs of infection. 12/28; patient presents for follow-up. She continues to use Dakin's to the left foot wound and Hydrofera Blue to the right heel wound. She denies signs of infection. 1/4; patient presents for follow-up. She has no issues or complaints today. She denies signs of infection. 1/11; patient presents for follow-up. It is unclear if she has been dressing these wounds over the past week. She currently denies signs of infection. 1/18; patient presents for follow-up. She states she has been using Dakin's wet-to-dry dressings to the left foot. She has been using Hydrofera Blue to the right foot foot wound. She states that the anterior right leg wound has reopened and draining  serous fluid. She denies signs of infection. 1/25; patient presents for follow-up. She has no issues or complaints today. 2/1; patient presents for follow-up. She has no issues or complaints today. She denies signs of infection. 2/8; patient presents for follow-up. She has lost her surgical shoes. She did not have a dressing to the right heel wound. She currently denies signs of infection. 2/15; patient presents for follow-up. She reports more pain to the right heel today. She denies purulent drainage Or fever/chills 2/22; patient presents for follow-up. She reports taking clindamycin over the past week. She states that she continues to have pain to her right heel. She reports purulent drainage. Readmission 03/16/2022 Ms. Alka Falwell is a 46 year old female with a past medical history of type 2 diabetes, osteomyelitis to her feet, chronic systolic heart failure and bipolar disorder that presents to the clinic for bilateral feet wounds and right lower extremity wound. She was last seen in our clinic on 12/15/2021. At that time she had purulent drainage coming out of her right plantar foot and I recommended she go to the ED. She states she went to [facility] and has been there for the past 3 months. I cannot see the records. She states she had OR debridement and was on several weeks of IV antibiotics while inpatient. Since discharge she has not been taking care of the wound beds. She had nothing on her feet other than socks today. She currently denies signs of infection. 5/31; patient presents for follow-up. She has been using Dakin's wet-to-dry dressings to the wound beds on her feet bilaterally and antibiotic ointment to the right anterior leg wound. She had a wound culture done at last clinic visit that showed moderate Pseudomonas aeruginosa sensitive to ciprofloxacin. She currently denies systemic signs of infection. 6/14; patient presents for follow-up. She received Keystone 5 days  ago and has been using this on the wound beds. She states that last week she had to go to the hospital because she had increased warmth and erythema to the right foot. She was started on 2 oral antibiotics. She states she has been taking these. She currently denies systemic signs of infection. She has no issues or complaints today. 6/21; patient presents for follow-up. She states she has been using Keystone antibiotics to the wound beds. She has no issues or complaints today. She denies signs of infection. 6/28; patient presents for follow-up. She has been using [address]  antibiotics to the wound beds. She has no issues or complaints today. 7/12; patient presents for follow-up. Has been using Keystone antibiotics to the wound beds with calcium alginate. She has no issues or complaints today. She never followed up with her orthopedic surgeon who did the OR debridement to the right foot. We discussed the total contact cast for the left foot and patient would like to do this next week. 7/19; patient presents for follow-up. She has been using Keystone antibiotics with calcium alginate to the wound beds. She has no issues or complaints today. Patient is in agreement to do the total contact cast of the left foot today. She knows to return later this week for the obligatory cast change. 05-13-2022 upon evaluation today patient's wound which she has the cast of the left leg actually appears to be doing significantly better. Fortunately I do not see any signs of active infection locally or systemically which is great news and overall I am extremely pleased with where we stand currently. 7/26; patient presents for follow-up. She has a cast in place for the past week. She states it irritated her shin. Other than that she tolerated the cast well. She states she would like a break for 1 week from the cast. We have been using Keystone antibiotic and Aquacel to both wound beds. She denies signs of infection. 8/2;  patient presents for follow-up. She has been using Keystone and Aquacel to the wound beds. She denies any issues and has no complaints. She is agreeable to have the cast placed today for the left leg. 06-03-2022 upon evaluation today patient appears to be doing well with regard to her wound she saw some signs of improvement which is great news. Fortunately I do not see any evidence of active infection locally or systemically at this time which is great news. No fevers, chills, nausea, vomiting, or diarrhea. 8/16; patient presents for follow-up. She has no issues or complaints today. We have been using Keystone and Aquacel to the wound beds. The left lower extremity is in a total contact cast. She is tolerated this well. 8/23; patient presents for follow-up. She has had the total contact cast on the left leg for the past week. Unfortunately this has rubbed and broken down the skin to the medial foot. She currently denies signs of infection. She has been using Keystone antibiotic to the right plantar foot wound. 8/30; patient presents for follow-up. We have held off on the total contact cast for the left leg for the past week. Her wound on the left foot has improved and the previous surrounding breakdown of skin has epithelialized. She has been using Keystone antibiotic to both wound beds. She has no issues or complaints TEIGEN, PARSLOW A ([id number]) [id number] Page 16 of 20 today. She denies signs of infection. 9/6; patient presents for follow-up. She has ordered her's Keystone antibiotic and this is arriving this week. She has been doing Dakin's wet-to-dry dressings to the wound beds. She denies signs of infection. She is agreeable to the total contact cast today. 9/13; patient presents for follow-up. She states that the cast caused her left leg shin to be sore. She would like to take a break from the cast this week. She has been using Keystone antibiotic to the right  plantar foot wound. She denies signs of infection. 9/20; patient presents for follow-up. She has been using Keystone antibiotics to the wound beds with calcium alginate to the right foot wound and Hydrofera Blue to the  left foot wound. She is agreeable to having the cast placed today. She has been approved for Apligraf and we will order this for next clinic visit. 9/27; patient presents for follow-up. We have been using Keystone antibiotic with Hydrofera Blue to the left foot wound under a total contact cast. T the right o foot wound she has been using Keystone antibiotic and calcium alginate. She declines a total contact cast today. Apligraf is available for placement and she would like to proceed with this. 07-28-2022 upon evaluation today patient appears to be doing well currently in regard to her wound. She is actually showing signs of significant improvement which is great news. Fortunately I do not see any evidence of active infection locally nor systemically at this time. She has been seeing Dr. Mikey Bussing and to be honest has been doing very well with the cast. Subsequently she comes in today with a cast on and we did reapply that today as well. She did not really want to she try to talk me out of that but I explained that if she wanted to heal this is really the right way to go. Patient voiced understanding. In regard to her right foot this is actually a lot better compared to the last time I saw her which is also great news. 10/11; patient presents for follow-up. Apligraf and the total contact cast was placed to the left leg at last clinic visit. She states that her right foot wound had burning pain to it with the placement of Apligraf to this area. She has been doing [place] over this area. She denies signs of infection including increased warmth, erythema or purulent drainage. 11/1; 3-week follow-up. The patient fortunately did not have a total contact cast or an Apligraf and on the left  foot. She has been using Keystone ABD pads and kerlix and her own running shoes She arrives in clinic today with thick callus and a very poor surface on the left foot on the right nonviable skin subcutaneous tissue and a deep probing hole. 11/15; patient missed her last clinic appointment. She states she has not been dressing the wound beds for the past 2 weeks. She states that at she had a new roommate but is now going back to live with her mother. Apparently its been a distracting 2 weeks. Patient currently denies signs of infection. 11/22; patient presents for follow-up. She states she has been using Keystone antibiotic and Dakin's wet-to-dry dressings to the wound beds. She is agreeable for cast placement today. We had ordered Apligraf however this has not been received by our facility. 11/29; Patient had a total contact cast placed at last clinic visit and she tolerated this well. We were using silver alginate under the cast. Patient's been using Keystone antibiotic with Aquacel to the right plantar foot wound. She has no issues or complaints today. Apligraf is available for placement today. Patient would like to proceed with this. 12/6; patient presents for follow-up. She had Apligraf placed in standard fashion last clinic visit under the total contact cast to the left lower extremity. She has been using Keystone antibiotic and Aquacel to the right plantar foot wound. She has no issues or complaints today. 12/13; patient presents for follow-up. She has finished 5 Apligraf placements. Was told she would not qualify for more. We have been doing a total contact cast to the left lower extremity. She has been using Keystone antibiotic and Aquacel to the right plantar foot wound. She has no issues or  complaints today. 12/20; patient presents for follow-up. We have been using Hydrofera Blue with Keystone antibiotic under a total contact cast of the left lower extremity. She reports using Keystone  antibiotic and silver alginate to the right heel wound. She has no issues or complaints today. 12/27; patient presents with a healthy wound on the left midfoot. We have Apligraf to apply that to that more also using a total contact cast. On the right we are using Keystone and silver alginate. She is offloading the right heel with a surgical shoealthough by her admission she is on her feet quite a bit 1/3; patient presents for follow-up. Apligraf was placed to the wound beds last clinic visit. She was placed in a total contact cast to the left lower extremity. She declines a total contact cast today. She states that her mother is in the hospital and she cannot adequately get around with the cast on. 1/10; patient presents for follow-up. She declined the total contact cast at last clinic visit. Both wounds have declined in appearance. She states that she has been on her feet and not offloading the wound beds. She currently denies signs of infection. 1/17; patient presents for follow-up. She had the total contact cast along with Apligraf placed last week to the left lower extremity. She tolerated this well. She has been using Aquacel Ag and Keystone antibiotic to the right heel wound. She currently denies signs of infection. She has no issues or complaints today. 1/24; patient presents for follow-up. We have been using Apligraf to the left foot wound along with a total contact cast. She has done well with this. T the right o heel wound she has been using Aquacel Ag and Keystone antibiotic ointment. She has no issues or complaints today. She denies signs of infection. 1/31; patient presents for follow-up. We have been using Apligraf to the left foot wound along with the total contact cast. She continues to do well with this. To the right heel we have been using Aquacel Ag and Keystone antibiotic ointment. She has no issues or complaints today. 12-01-2022 upon evaluation patient is seen today on my schedule  due to the fact that she unfortunately was in the hospital yesterday. Her cast needed to come off the only reason she is out of the hospital is due to the fact that they would not take it off at the hospital which is somewhat bewildered reading to me to be perfectly honest. I am not certain why this was but either way she was released and then was placed on my schedule today in order to get this off and reapply the total contact casting as appropriate. I do not have an Apligraf for her today it was applied last week and today's actually expired yesterday as there was some scheduling conflicts with her being in the hospital. Nonetheless we do not have that for reapplication today but the good news that she is not draining too much and the Apligraf can go for up to 2 weeks so I am going to go ahead and reapply the total contact casting but we are going to leave the Apligraf in place. 2/14; patient presents for follow-up. T the left leg she has had the total contact cast and Apligraf for the past week. She has had no issues with this. T the o o right heel she has been using antibiotic ointment and Aquacel Ag. 2/21; patient presents for follow-up. We have been using Apligraf and a total contact cast to  the left lower extremity. She is tolerated this well. Unfortunately she is not approved for any more Apligraf per insurance. She has been using antibiotic ointment and Aquacel Ag to the right foot. She has no issues or complaints today. 2/28; patient presents for follow-up. We have been using Hydrofera Blue and antibiotic ointment under the total contact cast to the left lower extremity. He has been using Aquacel Ag with antibiotic ointment to the right plantar foot. She has no issues or complaints today. 3/6; patient presents for follow-up. She did not obtain her gentamicin ointment. She has been using Aquacel Ag to the right plantar foot wound. We have been using Hydrofera Blue with antibiotic ointment  under the total contact cast to the left lower extremity. She has no issues or complaints today. 3/12; patient presents for follow-up. She has been using gentamicin ointment and Aquacel Ag to the right plantar foot wound. We have been using Hydrofera Blue with antibiotic ointment under the total contact cast on the left lower extremity foot wound. She has no issues or complaints today. 3/20; patient presents for follow-up. She has been using gentamicin ointment and Aquacel Ag to the right plantar foot wound. We have been using Hydrofera Blue with antibiotic ointment under the total contact cast to the left lower extremity wound. She has no issues or complaints today. ZENDA, HERSKOWITZ A ([id number]) [id number] Page 17 of 20 3/27; patient presents for follow-up. We have been using antibiotic ointment and Aquacel Ag to the right plantar foot wound. We have been using Hydrofera Blue with antibiotic ointment under the total contact cast to the left lower extremity. Both wounds are smaller. 01-24-2023 upon evaluation today patient actually appears to be doing better compared to last time I saw her. It has been several weeks since I last saw her but nonetheless I think the total contact casting is doing a good job. Fortunately I do not see any evidence of infection or worsening overall which is great news and in general I do believe that she is moving in the appropriate direction. It has been a very long road but nonetheless I feel like she is nearing the end at least in regard to the left foot and even the right foot looks much better to be perfectly honest. 01-31-2023 upon evaluation today patient appears to be doing well currently in regard to her wounds. She has been require some sharp debridement which I think is probably bone both sides to remove a lot of callus that has built up at this point. I discussed that with her today. Also think total contact cast is doing well for the  left foot Emina continue that as well. 4/17; patient presents for follow-up. We have been doing Hydrofera Blue with antibiotic ointment under the total contact cast to the left lower extremity and Aquacel Ag with antibiotic ointment to the right foot wound. She has been using her offloading heel shoe here. Objective Constitutional Vitals Time Taken: 10:23 AM, Height: 69 in, Weight: 178 lbs, BMI: 26.3, Temperature: 98.4 F, Pulse: 72 bpm, Respiratory Rate: 18 breaths/min, Blood Pressure: 105/72 mmHg. General Notes: Right foot: T the plantar heel there is an incision site with increased depth, No probing to bone. T the opening there is Nonviable tissue and o o granulation tissue. Left foot: T the medial aspect there is an open wound with granulation tissue, Callus and slough build up. No overt signs of infection to any o of the wound beds. Integumentary (Hair, Skin) Wound #11  status is Open. Original cause of wound was Surgical Injury. The date acquired was: 12/01/2021. The wound has been in treatment 47 weeks. The wound is located on the Right,Plantar Foot. The wound measures 0.5cm length x 0.5cm width x 0.3cm depth; 0.196cm^2 area and 0.059cm^3 volume. There is Fat Layer (Subcutaneous Tissue) exposed. There is a small amount of serous drainage noted. The wound margin is distinct with the outline attached to the wound base. There is no granulation within the wound bed. There is a medium (34-66%) amount of necrotic tissue within the wound bed including Adherent Slough. Wound #12 status is Open. Original cause of wound was Pressure Injury. The date acquired was: 03/16/2020. The wound has been in treatment 47 weeks. The wound is located on the Left,Medial,Plantar Foot. The wound measures 0.5cm length x 0.5cm width x 0.2cm depth; 0.196cm^2 area and 0.039cm^3 volume. There is Fat Layer (Subcutaneous Tissue) exposed. There is a small amount of serous drainage noted. The wound margin is flat and intact.  There is large (67- 100%) red, pink granulation within the wound bed. There is a small (1-33%) amount of necrotic tissue within the wound bed including Adherent Slough. 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 I debrided nonviable tissue. I recommended continuing the course with Hydrofera Blue and antibiotic ointment under the total contact cast to the left and Aquacel Ag with antibiotic ointment to the right along with her offloading shoe. Follow-up in 1 week. Procedures Wound #11 Pre-procedure diagnosis of Wound #11 is an Open Surgical Wound located on the Right,Plantar Foot . There was a Excisional Skin/Subcutaneous Tissue Debridement with a total area of 0.25 sq cm performed by Geralyn Corwin, MD. With the following instrument(s): Curette to remove Viable and Non-Viable tissue/material. Material removed includes Callus, Subcutaneous Tissue, and Slough after achieving pain control using Lidocaine 2% T opical Gel. No specimens were taken. A time out was conducted at 10:43, prior to the start of the procedure. A Minimum amount of bleeding was controlled with Pressure. The procedure was tolerated well with a pain level of 0 throughout and a pain level of 0 following the procedure. Post Debridement Measurements: 0.5cm length x 0.5cm width x 0.3cm depth; 0.059cm^3 volume. Character of Wound/Ulcer Post Debridement is stable. Post procedure Diagnosis Wound #11: Same as Pre-Procedure Wound #12 Pre-procedure diagnosis of Wound #12 is a Diabetic Wound/Ulcer of the Lower Extremity located on  the Left,Medial,Plantar Foot .Severity of Tissue Pre Debridement is: Fat layer exposed. There was a Selective/Open Wound Skin/Dermis Debridement with a total area of 0.25 sq cm performed by Marisue Ivan, Sybel A ([id number]) [phone].pdf Page 73 of 20 Shanda Bumps, MD. With the following instrument(s): Curette to remove Viable and Non-Viable tissue/material. Material removed includes Callus, Slough, and Skin: Dermis after achieving pain control using Lidocaine 2% T opical Gel. No specimens were taken. A time out was conducted at 10:43, prior to the start of the procedure. A Minimum amount of bleeding was controlled with Pressure. The procedure was tolerated well with a pain level of 0 throughout and a pain level of 0 following the procedure. Post Debridement Measurements: 0.5cm length x  0.5cm width x 0.2cm depth; 0.039cm^3 volume. Character of Wound/Ulcer Post Debridement is stable. Severity of Tissue Post Debridement is: Fat layer exposed. Post procedure Diagnosis Wound #12: Same as Pre-Procedure Plan Follow-up Appointments: Return Appointment in 1 week. Nurse Visit as needed Bathing/ Shower/ Hygiene: May shower with wound dressing protected with water repellent cover or cast protector. No tub bath. Anesthetic (Use 'Patient Medications' Section for Anesthetic Order Entry): Lidocaine applied to wound bed Edema Control - Lymphedema / Segmental Compressive Device / Other: Elevate, Exercise Daily and Avoid Standing for Long Periods of Time. Elevate legs to the level of the heart and pump ankles as often as possible Elevate leg(s) parallel to the floor when sitting. Off-Loading: T Contact Cast to Left Lower Extremity otal Open toe surgical shoe - Heel off loader - Right foot Other: - keep pressure off of feet. Additional Orders / Instructions: Follow Nutritious Diet and Increase Protein Intake Other: - Stay off of foot, wheelchair only WOUND #11: - Foot Wound  Laterality: Plantar, Right Cleanser: Wound Cleanser 1 x Per Day/30 Days Discharge Instructions: Wash your hands with soap and water. Remove old dressing, discard into plastic bag and place into trash. Cleanse the wound with Wound Cleanser prior to applying a clean dressing using gauze sponges, not tissues or cotton balls. Do not scrub or use excessive force. Pat dry using gauze sponges, not tissue or cotton balls. Topical: Gentamicin 1 x Per Day/30 Days Discharge Instructions: Apply as directed by provider. in clinic Prim Dressing: Aquacel Extra Hydrofiber Dressing, 2x2 (in/in) 1 x Per Day/30 Days ary Secondary Dressing: ABD Pad 5x9 (in/in) (Generic) 1 x Per Day/30 Days Discharge Instructions: Cover with ABD pad Secured With: Medipore T - 635M Medipore H Soft Cloth Surgical T ape ape, 2x2 (in/yd) 1 x Per Day/30 Days Secured With: [employer] Sterile or Non-Sterile 6-ply 4.5x4 (yd/yd) 1 x Per Day/30 Days Discharge Instructions: Apply Kerlix as directed WOUND #12: - Foot Wound Laterality: Plantar, Left, Medial Cleanser: Wound Cleanser 1 x Per Week/30 Days Discharge Instructions: Wash your hands with soap and water. Remove old dressing, discard into plastic bag and place into trash. Cleanse the wound with Wound Cleanser prior to applying a clean dressing using gauze sponges, not tissues or cotton balls. Do not scrub or use excessive force. Pat dry using gauze sponges, not tissue or cotton balls. Topical: Gentamicin 1 x Per Week/30 Days Discharge Instructions: Apply as directed by provider. in clinic Prim Dressing: Hydrofera Blue Ready Transfer Foam, 2.5x2.5 (in/in) 1 x Per Week/30 Days ary Discharge Instructions: Apply Hydrofera Blue Ready to wound bed as directed Secondary Dressing: ABD Pad 5x9 (in/in) (Generic) 1 x Per Week/30 Days Discharge Instructions: Cover with ABD pad Secured With: Medipore T - 635M Medipore H Soft Cloth Surgical T ape ape, 2x2 (in/yd) 1 x Per Week/30 Days Secured With:  [employer] Sterile or Non-Sterile 6-ply 4.5x4 (yd/yd) 1 x Per Week/30 Days Discharge Instructions: Apply Kerlix as directed 1. In office sharp debridement 2. Aquacel Ag and antibiotic ointmentooright foot 3. Hydrofera Blue and antibiotic ointment under the total contact castooleft lower extremity 4. T contact cast placed in standard fashion otal 5. Follow-up in 1 week Electronic Signature(s) Signed: 02/08/2023 11:12:29 AM By: Geralyn Corwin DO Entered By: Geralyn Corwin on 02/08/2023 10:58:09 -------------------------------------------------------------------------------- ROS/PFSH Details Patient Name: Date of Service: [name], Azarah A. 02/08/2023 10:00 A M Medical Record Number: [id number] Patient Account Number: [id number] Date of Birth/Sex: Treating RN: May 29, 1976 (47 y.o. [name], [name] Primary Care  Provider: [name] Other Clinician: Referring Provider: Treating Provider/Extender: Chapman Moss in Treatment: [address], [place] A ([id number]) [id number] Page 19 of 20 Information Obtained From Patient Ear/Nose/Mouth/Throat Medical History: Positive for: Chronic sinus problems/congestion; Middle ear problems Hematologic/Lymphatic Medical History: Positive for: Anemia Respiratory Medical History: Positive for: Chronic Obstructive Pulmonary Disease (COPD) Cardiovascular Medical History: Positive for: Congestive Heart Failure - EF 33% Endocrine Medical History: Positive for: Type II Diabetes Time with diabetes: 14 years Treated with: Insulin, Oral agents Blood sugar tested every day: No Blood sugar testing results: Bedtime: 176 Genitourinary Medical History: Positive for: End Stage Renal Disease Integumentary (Skin) Medical History: Positive for: History of pressure wounds Neurologic Medical History: Positive for: Neuropathy - feel and legs HBO Extended History Items Ear/Nose/Mouth/Throat:  Ear/Nose/Mouth/Throat: Chronic sinus Middle ear problems problems/congestion Immunizations Pneumococcal Vaccine: Received Pneumococcal Vaccination: No Implantable Devices No devices added Family and Social History Current every day smoker; Marital Status - Single; Alcohol Use: Never; Drug Use: Prior History; Caffeine Use: Daily Electronic Signature(s) Signed: 02/08/2023 11:12:29 AM By: Geralyn Corwin DO Signed: 02/08/2023 2:56:04 PM By: Bonnell Public Entered By: Geralyn Corwin on 02/08/2023 10:59:54 -------------------------------------------------------------------------------- SuperBill Details Patient Name: Date of Service: [name], LOCKIE BOTHUN 02/08/2023 Earmon [place] A ([id number]) [id number] Page 20 of 20 Medical Record Number: [id number] Patient Account Number: [id number] Date of Birth/Sex: Treating RN: 19-Feb-1976 (47 y.o. [name], [name] Primary Care Provider: [name] Other Clinician: Referring Provider: Treating Provider/Extender: Chapman Moss in Treatment: 38 Diagnosis Coding ICD-10 Codes Code Description [phone] Non-pressure chronic ulcer of other part of left foot with other specified severity 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.621 Type 2 diabetes mellitus with foot ulcer E11.42 Type 2 diabetes mellitus with diabetic polyneuropathy L97.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 of skin Facility Procedures : CPT4 Code: [id number] Description: 11042 - DEB SUBQ TISSUE 20 SQ CM/< ICD-10 Diagnosis Description 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.621 Type 2 diabetes mellitus with foot ulcer Modifier: Quantity: 1 : CPT4 Code: [id number] Description: 10272 - APPLY TOTAL CONTACT LEG CAST ICD-10 Diagnosis Description L97.528 Non-pressure chronic ulcer of other part of  left foot with other specified sever E11.621 Type 2 diabetes mellitus with foot ulcer Modifier: ity Quantity: 1 Physician Procedures : CPT4 Code Description Modifier [id number] 11042 - WC PHYS SUBQ TISS 20 SQ CM ICD-10 Diagnosis Description 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.621 Type 2 diabetes mellitus with foot ulcer Quantity: 1 : [id number] 29445 - WC PHYS APPLY TOTAL CONTACT CAST ICD-10 Diagnosis Description 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ity E11.621 Type 2 diabetes mellitus with foot ulcer Quantity: 1 Electronic Signature(s) Signed: 02/08/2023 11:12:29 AM By: Geralyn Corwin DO Entered By: Geralyn Corwin on 02/08/2023 10:58:43

## 2023-02-08 NOTE — Progress Notes (Signed)
Heather Heather, Heather Heather ([id number]) [id number] Page 1 of 8 Visit Report for 02/08/2023 Arrival Information Details Patient Name: Date of Service: Heather Heather 02/08/2023 10:00 Heather Heather Medical Record Number: [id number] Patient Account Number: [id number] Date of Birth/Sex: Treating RN: 06/29/1976 (47 y.o. [name], [name] Primary Care Krystl Wickware: [name] Other Clinician: Referring Sharissa Brierley: Treating Shalandria Elsbernd/Extender: Chapman Moss in Treatment: 7 Visit Information History Since Last Visit Has Dressing in Place as Prescribed: Yes Patient Arrived: Wheel Chair Pain Present Now: No Arrival Time: 10:22 Accompanied By: family member Transfer Assistance: None Patient Identification Verified: Yes Secondary Verification Process Completed: Yes Patient Requires Transmission-Based Precautions: No Patient Has Alerts: No Electronic Signature(s) Signed: 02/08/2023 2:56:04 PM By: Bonnell Public Entered By: Bonnell Public on 02/08/2023 10:22:39 -------------------------------------------------------------------------------- Encounter Discharge Information Details Patient Name: Date of Service: Heather Heather. 02/08/2023 10:00 Heather Heather Medical Record Number: [id number] Patient Account Number: [id number] Date of Birth/Sex: Treating RN: [birth year] (47 y.o. [name], [name] Primary Care Ayme Short: [name] Other Clinician: Referring Cherly Erno: Treating Salle Brandle/Extender: Chapman Moss in Treatment: 51 Encounter Discharge Information Items Post Procedure Vitals Discharge Condition: Stable Temperature (F): 98.4 Ambulatory Status: Walker Pulse (bpm): 72 Discharge Destination: Home Respiratory Rate (breaths/min): 18 Transportation: Private Auto Blood Pressure (mmHg): 105/72 Accompanied By: mother Schedule Follow-up Appointment: Yes Clinical Summary of Care: Electronic Signature(s) Signed: 02/08/2023 2:56:04 PM By: Bonnell Public Entered By: Bonnell Public on 02/08/2023 14:49:23 -------------------------------------------------------------------------------- Lower Extremity Assessment Details Patient Name: Date of Service: Heather Heather 02/08/2023 10:00 Heather Heather Medical Record Number: [id number] Patient Account Number: [id number] Date of Birth/Sex: Treating RN: Aug 12, 1976 (47 y.o. [name], [name] Primary Care Mashelle Busick: [name] Other Clinician: Referring Val Farnam: Treating Deniss Wormley/Extender: [name] Weeks in Treatment: 47 Edema Assessment Assessed: [Left: No] [Right: No] Edema: [Left: No] [Right: No] Vascular Assessment Heather Heather ([id number]) [Right:[id number] Page 2 of 8] Pulses: Dorsalis Pedis Palpable: [Left:Yes] [Right:Yes] Electronic Signature(s) Signed: 02/08/2023 2:56:04 PM By: Bonnell Public Entered By: Bonnell Public on 02/08/2023 10:40:13 -------------------------------------------------------------------------------- Multi Wound Chart Details Patient Name: Date of Service: Heather Heather, Heather Heather. 02/08/2023 10:00 Heather Heather Medical Record Number: [id number] Patient Account Number: [id number] Date of Birth/Sex: Treating RN: 08/18/1976 (47 y.o. 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: Extremity Chronic sinus problems/congestion, Chronic sinus problems/congestion, N/Heather Comorbid History: Middle ear problems, Anemia,  Chronic Middle ear problems, Anemia, Chronic Obstructive Pulmonary Disease Obstructive Pulmonary Disease (COPD), Congestive Heart Failure, (COPD), Congestive Heart Failure, Type II Diabetes, End Stage Renal Type II Diabetes, End Stage Renal Disease, History of pressure wounds, Disease, History of pressure wounds, Neuropathy Neuropathy 12/01/2021 03/16/2020 N/Heather Date Acquired: 66 47 N/Heather Weeks of Treatment: Open Open N/Heather Wound Status: No No N/Heather Wound Recurrence: 0.5x0.5x0.3 0.5x0.5x0.2 N/Heather Measurements L x W x D (cm) 0.196 0.196 N/Heather Heather (cm) : rea 0.059 0.039 N/Heather Volume (cm) : 89.40% 95.80% N/Heather % Reduction in Heather rea: 96.40% 95.90% N/Heather % Reduction in Volume: Full Thickness Without Exposed Grade 3 N/Heather Classification: Support Structures Small Small N/Heather Exudate Heather mount: Serous Serous N/Heather Exudate Type: amber amber N/Heather Exudate Color:  Distinct, outline attached Flat and Intact N/Heather Wound Margin: None Present (0%) Large (67-100%) N/Heather Granulation Heather mount: N/Heather Red, Pink N/Heather Granulation Quality: Medium (34-66%) Small (1-33%) N/Heather Necrotic Heather mount: Fat Layer (Subcutaneous Tissue): Yes Fat Layer (Subcutaneous Tissue): Yes N/Heather Exposed Structures: Fascia: No Fascia: No Tendon: No Tendon: No Muscle: No Muscle: No Joint: No Joint: No Bone: No Bone: No Medium (34-66%) None N/Heather Epithelialization: Debridement - Excisional Debridement - Selective/Open Wound N/Heather Debridement: Pre-procedure Verification/Time Out 10:43 10:43 N/Heather Taken: Lidocaine 2% Topical Gel Lidocaine 2% T opical Gel N/Heather Pain Control: Callus, Subcutaneous, Slough Callus, Slough N/Heather Tissue DebridedDARL, Heather Heather ([id number]) [id number] Page 3 of 8 Skin/Subcutaneous Tissue Skin/Dermis N/Heather Level: 0.25 0.25 N/Heather Debridement Heather (sq cm): rea Curette Curette N/Heather Instrument: Minimum Minimum N/Heather Bleeding: Pressure Pressure N/Heather Hemostasis Achieved: 0 0 N/Heather Procedural Pain: 0 0 N/Heather Post  Procedural Pain: Procedure was tolerated well Procedure was tolerated well N/Heather Debridement Treatment Response: 0.5x0.5x0.3 0.5x0.5x0.2 N/Heather Post Debridement Measurements L x W x D (cm) 0.059 0.039 N/Heather Post Debridement Volume: (cm) Debridement Debridement N/Heather Procedures Performed: Treatment Notes Electronic Signature(s) Signed: 02/08/2023 2:56:04 PM By: Bonnell Public Entered By: Bonnell Public on 02/08/2023 10:46:14 -------------------------------------------------------------------------------- Multi-Disciplinary Care Plan Details Patient Name: Date of Service: Heather Heather. 02/08/2023 10:00 Heather Heather Medical Record Number: [id number] Patient Account Number: [id number] Date of Birth/Sex: Treating RN: November 25, 1975 (47 y.o. [name], [name] Primary Care Marbeth Smedley: [name] Other Clinician: Referring Haneen Bernales: Treating [name]/Extender: Chapman Moss in Treatment: 1 Active Inactive Abuse / Safety / Falls / Self Care Management Nursing Diagnoses: History of Falls Potential for falls Potential for injury related to falls Goals: Patient will not develop complications from immobility Date Initiated: 03/17/2022 Date Inactivated: 04/20/2022 Target Resolution Date: 03/16/2022 Goal Status: Met Patient/caregiver will verbalize understanding of skin care regimen Date Initiated: 03/17/2022 Date Inactivated: 09/28/2022 Target Resolution Date: 03/16/2022 Goal Status: Met Patient/caregiver will verbalize/demonstrate measure taken to improve self care Date Initiated: 03/17/2022 Target Resolution Date: 03/16/2022 Goal Status: Active Interventions: Assess fall risk on admission and as needed Provide education on basic hygiene Provide education on personal and home safety Notes: Necrotic Tissue Nursing Diagnoses: Impaired tissue integrity related to necrotic/devitalized tissue Knowledge deficit related to management of necrotic/devitalized  tissue Goals: Necrotic/devitalized tissue will be minimized in the wound bed Date Initiated: 03/17/2022 Target Resolution Date: 03/19/2023 Goal Status: Active Patient/caregiver will verbalize understanding of reason and process for debridement of necrotic tissue Date Initiated: 03/17/2022 Date Inactivated: 09/28/2022 Target Resolution Date: 03/16/2022 Goal Status: Met Interventions: Provide education on necrotic tissue and debridement process KYLANI, WIRES Heather ([id number]) [id number] Page 4 of 8 Treatment Activities: Apply topical anesthetic as ordered : 03/16/2022 Excisional debridement : 03/16/2022 Notes: Osteomyelitis Nursing Diagnoses: Infection: osteomyelitis Knowledge deficit related to disease process and management Goals: Patient/caregiver will verbalize understanding of disease process and disease management Date Initiated: 03/17/2022 Target Resolution Date: 03/16/2022 Goal Status: Active Patient's osteomyelitis will resolve Date Initiated: 03/17/2022 Target Resolution Date: 10/26/2022 Goal Status: Active Signs and symptoms for osteomyelitis will be recognized and promptly addressed Date Initiated: 03/17/2022 Target Resolution Date: 10/26/2022 Goal Status: Active Interventions: Assess for signs and symptoms of osteomyelitis resolution every visit Provide education on osteomyelitis Treatment Activities: Systemic antibiotics : 03/16/2022 Notes: Patient on antibiotics. Pressure Nursing Diagnoses: Knowledge deficit related to management of pressures ulcers Potential for impaired tissue integrity related to pressure, friction, moisture, and shear Goals: Patient will remain free from development of additional pressure ulcers Date Initiated: 11/02/2022  Target Resolution Date: 03/17/2023 Goal Status: Active Patient/caregiver will verbalize risk factors for pressure ulcer development Date Initiated: 11/02/2022 Target Resolution Date: 03/17/2023 Goal Status:  Active Patient/caregiver will verbalize understanding of pressure ulcer management Date Initiated: 11/02/2022 Target Resolution Date: 03/17/2023 Goal Status: Active Interventions: Assess: immobility, friction, shearing, incontinence upon admission and as needed Provide education on pressure ulcers Notes: Electronic Signature(s) Signed: 02/08/2023 2:56:04 PM By: Bonnell Public Entered By: Bonnell Public on 02/08/2023 11:15:04 -------------------------------------------------------------------------------- Pain Assessment Details Patient Name: Date of Service: Heather Heather, Heather Heather 02/08/2023 10:00 Heather Heather Medical Record Number: [id number] Patient Account Number: [id number] Date of Birth/Sex: Treating RN: 1976/04/15 (48 y.o. [name], [name] Primary Care Lorry Furber: [name] Other Clinician: Referring Brealyn Baril: Treating Daneille Desilva/Extender: Chapman Moss in Treatment: 56 Active Problems Location of Pain Severity and Description of Pain Heather Heather, Heather Heather ([id number]) [id number] Page 5 of 8 Patient Has Paino No Site Locations Pain Management and Medication Current Pain Management: Electronic Signature(s) Signed: 02/08/2023 2:56:04 PM By: Bonnell Public Entered By: Bonnell Public on 02/08/2023 10:25:34 -------------------------------------------------------------------------------- Patient/Caregiver Education Details Patient Name: Date of Service: [name] 4/17/2024andnbsp10:00 Heather Heather Medical Record Number: [id number] Patient Account Number: [id number] Date of Birth/Gender: Treating RN: [birth year] (47 y.o. [name], [name] Primary Care Physician: [name] Other Clinician: Referring Physician: Treating Physician/Extender: Chapman Moss in Treatment: 26 Education Assessment Education Provided To: Patient Education Topics Provided Wound Debridement: Handouts: Wound Debridement Methods:  Explain/Verbal Responses: State content correctly Electronic Signature(s) Signed: 02/08/2023 2:56:04 PM By: Bonnell Public Entered By: Bonnell Public on 02/08/2023 14:47:27 -------------------------------------------------------------------------------- Wound Assessment Details Patient Name: Date of Service: Heather Heather, Heather Heather 02/08/2023 10:00 Heather Heather Medical Record Number: [id number] Patient Account Number: [id number] Date of Birth/Sex: Treating RN: 09-02-1976 (47 y.o. [name], [name] Primary Care Amaia Lavallie: [name] Other Clinician: Referring Nello Corro: Treating Ayane Delancey/Extender: [name] Weeks in Treatment: 51 Wound Status Wound Number: 11 Primary Open Surgical Wound Etiology: Wound Location: Right, Plantar Foot Wound Open Wounding Event: Surgical Injury Status: Heather Heather, Heather Heather ([id number]) [id number] Page 6 of 8 Status: Date Acquired: 12/01/2021 Comorbid Chronic sinus problems/congestion, Middle ear problems, Anemia, Weeks Of Treatment: 47 History: Chronic Obstructive Pulmonary Disease (COPD), Congestive Heart Clustered Wound: No Failure, Type II Diabetes, End Stage Renal Disease, History of pressure wounds, Neuropathy Photos Wound Measurements Length: (cm) 0.5 Width: (cm) 0.5 Depth: (cm) 0.3 Area: (cm) 0.196 Volume: (cm) 0.059 % Reduction in Area: 89.4% % Reduction in Volume: 96.4% Epithelialization: Medium (34-66%) Wound Description Classification: Full Thickness Without Exposed Support Structures Wound Margin: Distinct, outline attached Exudate Amount: Small Exudate Type: Serous Exudate Color: amber Foul Odor After Cleansing: No Slough/Fibrino Yes Wound Bed Granulation Amount: None Present (0%) Exposed Structure Necrotic Amount: Medium (34-66%) Fascia Exposed: No Necrotic Quality: Adherent Slough Fat Layer (Subcutaneous Tissue) Exposed: Yes Tendon Exposed: No Muscle Exposed: No Joint Exposed: No Bone  Exposed: No Treatment Notes Wound #11 (Foot) Wound Laterality: Plantar, Right Cleanser Wound Cleanser Discharge Instruction: Wash your hands with soap and water. Remove old dressing, discard into plastic bag and place into trash. Cleanse the wound with Wound Cleanser prior to applying Heather clean dressing using gauze sponges, not tissues or cotton balls. Do not scrub or use excessive force. Pat dry using gauze sponges, not tissue or cotton balls. Heather-Wound Care Topical Gentamicin Discharge Instruction: Apply as directed by [name] Lung. in clinic Primary Dressing Aquacel Extra Hydrofiber Dressing, 2x2 (in/in) Secondary Dressing ABD Pad 5x9 (in/in) Discharge Instruction: Cover with ABD pad  Secured With Medipore T - 89M Medipore H Soft Cloth Surgical T ape ape, 2x2 (in/yd) Kerlix Roll Sterile or Non-Sterile 6-ply 4.5x4 (yd/yd) Discharge Instruction: Apply Kerlix as directed Compression Wrap Compression Stockings Add-Ons Heather Heather, Heather Heather ([id number]) [phone].pdf Page 7 of 8 Electronic Signature(s) Signed: 02/08/2023 2:56:04 PM By: Bonnell Public Entered By: Bonnell Public on 02/08/2023 10:37:45 -------------------------------------------------------------------------------- Wound Assessment Details Patient Name: Date of Service: Heather Heather, Heather Heather 02/08/2023 10:00 Heather Heather Medical Record Number: [id number] Patient Account Number: [id number] Date of Birth/Sex: Treating RN: 1976-05-30 (47 y.o. [name], [name] Primary Care Obie Kallenbach: [name] Other Clinician: Referring Deannie Resetar: Treating Kalin Kyler/Extender: [name] Weeks in Treatment: 47 Wound Status Wound Number: 12 Primary Diabetic Wound/Ulcer of the Lower Extremity Etiology: Wound Location: Left, Medial, Plantar Foot Wound Open Wounding Event: Pressure Injury Status: Date Acquired: 03/16/2020 Comorbid Chronic sinus problems/congestion, Middle ear problems, Anemia, Weeks Of Treatment:  47 History: Chronic Obstructive Pulmonary Disease (COPD), Congestive Heart Clustered Wound: No Failure, Type II Diabetes, End Stage Renal Disease, History of pressure wounds, Neuropathy Photos Wound Measurements Length: (cm) 0.5 Width: (cm) 0.5 Depth: (cm) 0.2 Area: (cm) 0.196 Volume: (cm) 0.039 % Reduction in Area: 95.8% % Reduction in Volume: 95.9% Epithelialization: None Wound Description Classification: Grade 3 Wound Margin: Flat and Intact Exudate Amount: Small Exudate Type: Serous Exudate Color: amber Foul Odor After Cleansing: No Slough/Fibrino No Wound Bed Granulation Amount: Large (67-100%) Exposed Structure Granulation Quality: Red, Pink Fascia Exposed: No Necrotic Amount: Small (1-33%) Fat Layer (Subcutaneous Tissue) Exposed: Yes Necrotic Quality: Adherent Slough Tendon Exposed: No Muscle Exposed: No Joint Exposed: No Bone Exposed: No Treatment Notes Wound #12 (Foot) Wound Laterality: Plantar, Left, Medial Cleanser Wound Cleanser Discharge Instruction: Wash your hands with soap and water. Remove old dressing, discard into plastic bag and place into trash. Cleanse the wound with Wound Cleanser prior to applying Heather clean dressing using gauze sponges, not tissues or cotton balls. Do not scrub or use excessive force. Pat dry using gauze sponges, not tissue or cotton balls. Heather-Wound Care Heather Heather, Heather Heather ([id number]) [id number] Page 8 of 8 Topical Gentamicin Discharge Instruction: Apply as directed by Axxel Gude. in clinic Primary Dressing Hydrofera Blue Ready Transfer Foam, 2.5x2.5 (in/in) Discharge Instruction: Apply Hydrofera Blue Ready to wound bed as directed Secondary Dressing ABD Pad 5x9 (in/in) Discharge Instruction: Cover with ABD pad Secured With Medipore T - 89M Medipore H Soft Cloth Surgical T ape ape, 2x2 (in/yd) Kerlix Roll Sterile or Non-Sterile 6-ply 4.5x4 (yd/yd) Discharge Instruction: Apply Kerlix as  directed Compression Wrap Compression Stockings Add-Ons Electronic Signature(s) Signed: 02/08/2023 2:56:04 PM By: Bonnell Public Entered By: Bonnell Public on 02/08/2023 10:38:39 -------------------------------------------------------------------------------- Vitals Details Patient Name: Date of Service: Heather Heather, Heather Heather. 02/08/2023 10:00 Heather Heather Medical Record Number: [id number] Patient Account Number: [id number] Date of Birth/Sex: Treating RN: 12-17-1975 (47 y.o. [name], [name] Primary Care Shade Kaley: [name] Other Clinician: Referring Kalissa Grays: Treating Chantel Teti/Extender: Chapman Moss in Treatment: 64 Vital Signs Time Taken: 10:23 Temperature (F): 98.4 Height (in): 69 Pulse (bpm): 72 Weight (lbs): 178 Respiratory Rate (breaths/min): 18 Body Mass Index (BMI): 26.3 Blood Pressure (mmHg): 105/72 Reference Range: 80 - 120 mg / dl Electronic Signature(s) Signed: 02/08/2023 2:56:04 PM By: Bonnell Public Entered By: Bonnell Public on 02/08/2023 10:23:43

## 2023-02-15 ENCOUNTER — Encounter (HOSPITAL_BASED_OUTPATIENT_CLINIC_OR_DEPARTMENT_OTHER): Payer: Medicaid Other | Admitting: Internal Medicine

## 2023-02-15 DIAGNOSIS — E11621 Type 2 diabetes mellitus with foot ulcer: Secondary | ICD-10-CM | POA: Diagnosis not present

## 2023-02-15 DIAGNOSIS — L97512 Non-pressure chronic ulcer of other part of right foot with fat layer exposed: Secondary | ICD-10-CM

## 2023-02-15 DIAGNOSIS — L97528 Non-pressure chronic ulcer of other part of left foot with other specified severity: Secondary | ICD-10-CM

## 2023-02-16 NOTE — Progress Notes (Signed)
SHALEIGH, LAUBSCHER [name] ([id number]) [id number] Page 1 of 20 Visit Report for 02/15/2023 Chief Complaint Document Details Patient Name: Date of Service: [name], [name] 02/15/2023 10:00 [name]: [id number] Patient Account Number: [id number] Date of Birth/Sex: Treating RN: 06/29/1976 (47 y.o. Skip Mayer Primary Care Provider: [name] Other Clinician: [name] Referring Provider: Treating Provider/Extender: Chapman Moss in Treatment: 15 Information Obtained from: Patient Chief Complaint 03/19/2021; patient referred by Dr. Excell Seltzer who has been looking after her left foot for quite [name] period of time for review of [name] nonhealing area in the left midfoot 03/12/2022; bilateral feet wounds and right lower extremity wound. Electronic Signature(s) Signed: 02/15/2023 12:31:39 PM By: Geralyn Corwin DO Entered By: Geralyn Corwin on 02/15/2023 10:53:39 -------------------------------------------------------------------------------- Debridement Details Patient Name: Date of Service: [name], [name] 02/15/2023 10:00 [name]: [id number] Patient Account Number: [id number] Date of Birth/Sex: Treating RN: 08/26/1976 (47 y.o. Skip Mayer Primary Care Provider: [name] Other Clinician: [name] Referring Provider: Treating Provider/Extender: Chapman Moss in Treatment: 48 Debridement Performed for Assessment: Wound #12 Left,Medial,Plantar Foot Performed By: Physician Geralyn Corwin, MD Debridement Type: Debridement Severity of Tissue Pre Debridement: Fat layer exposed Level of Consciousness (Pre-procedure): Awake and Alert Pre-procedure Verification/Time Out Yes - 10:46 Taken: Start Time: 10:46 Percent of Wound Bed Debrided: 100% T Area Debrided (cm): otal 0.16 Tissue and other material debrided: Viable, Non-Viable, Callus, Slough, Slough Level: Non-Viable  Tissue Debridement Description: Selective/Open Wound Instrument: Curette Bleeding: Minimum Hemostasis Achieved: Pressure Response to Treatment: Procedure was tolerated well Level of Consciousness (Post- Awake and Alert procedure): Post Debridement Measurements of Total Wound Length: (cm) 0.4 Width: (cm) 0.5 Depth: (cm) 0.2 Volume: (cm) 0.031 Character of Wound/Ulcer Post Debridement: Stable Severity of Tissue Post Debridement: Fat layer exposed Post Procedure Diagnosis Same as Pre-procedure Electronic Signature(s) Signed: 02/15/2023 12:31:39 PM By: Geralyn Corwin DO Signed: 02/15/2023 4:32:09 PM By: [name] Signed: 02/15/2023 4:54:10 PM By: Elliot Gurney, BSN, RN, CWS, [name] RN, BSN Entered By: [name] on 02/15/2023 10:47:33 [name] ([id number]) [id number] Page 2 of 20 -------------------------------------------------------------------------------- Debridement Details Patient Name: Date of Service: [name], [name] 02/15/2023 10:00 [name]: [id number] Patient Account Number: [id number] Date of Birth/Sex: Treating RN: 26-Mar-1976 (47 y.o. Cathlean Cower, [name] Primary Care Provider: [name] Other Clinician: [name] Referring Provider: Treating Provider/Extender: Chapman Moss in Treatment: 48 Debridement Performed for Assessment: Wound #11 Right,Plantar Foot Performed By: Physician Geralyn Corwin, MD Debridement Type: Debridement Level of Consciousness (Pre-procedure): Awake and Alert Pre-procedure Verification/Time Out Yes - 10:50 Taken: Start Time: 10:50 Percent of Wound Bed Debrided: 100% T Area Debrided (cm): otal 0.07 Tissue and other material debrided: Viable, Non-Viable, Slough, Subcutaneous, Slough Level: Skin/Subcutaneous Tissue Debridement Description: Excisional Instrument: Curette Bleeding: Minimum Hemostasis Achieved: Pressure Response to Treatment: Procedure was  tolerated well Level of Consciousness (Post- Awake and Alert procedure): Post Debridement Measurements of Total Wound Length: (cm) 0.3 Width: (cm) 0.3 Depth: (cm) 0.4 Volume: (cm) 0.028 Character of Wound/Ulcer Post Debridement: Stable Post Procedure Diagnosis Same as Pre-procedure Electronic Signature(s) Signed: 02/15/2023 12:31:39 PM By: Geralyn Corwin DO Signed: 02/15/2023 4:32:09 PM By: [name] Signed: 02/15/2023 4:54:10 PM By: Elliot Gurney, BSN, RN, CWS, [name] RN, BSN Entered By: [name] on 02/15/2023 10:50:59 -------------------------------------------------------------------------------- HPI Details Patient Name: Date of Service: [name], [name] 02/15/2023 10:00 [name]: [id number] Patient Account Number: [id number] Date of Birth/Sex: Treating  RN: 1976-03-24 (47 y.o. Skip Mayer Primary Care Provider: [name] Other Clinician: [name] Referring Provider: Treating Provider/Extender: Chapman Moss in Treatment: 48 History of Present Illness HPI Description: 01/18/18-She is here for initial evaluation of the left great toe ulcer. She is [name] poor historian in regards to timeframe in detail. She states approximately 4 weeks ago she lacerated her toe on something in the house. She followed up with her primary care who placed her on Bactrim and ultimately [name] second dose of Bactrim prior to coming to wound clinic. She states she has been treating the toe with peroxide, Betadine and [name] Band-Aid. She did not check her blood sugar this morning but checked it yesterday morning it was 327; she is unaware of [name] recent A1c and there are no current records. She saw Dr. she would've orthopedics last week for an old injury to the left ankle, she states he did not see her toe, nor did she bring it to his attention. She smokes approximately 1 pack cigarettes [name] day. Her social situation is concerning, she arrives this morning with her  mother who appears extremely intoxicated/under the influence; her mother was asked to leave the room and be monitored by the patient's grandmother. The patient's aunt then accompanied the patient and the room throughout the rest of the appointment. We had [name] lengthy discussion regarding the deleterious effects of uncontrolled hyperglycemia and smoking as it relates to wound healing and overall health. She was strongly encouraged to decrease her smoking and get her diabetes under better control. She states she is currently on [name] diet and has cut down her [facility] consumption. The left toe is erythematous, macerated and slightly edematous with malodor present. The edema in her left foot is below her baseline, there is no erythema streaking. We will treat her with Santyl, doxycycline; we have ordered and xray, culture and provided [name] Peg assist surgical shoe and cultured the wound. 01/25/18-She is here in follow-up evaluation for [name] left great toe ulcer and presents with an abscess to her suprapubic area. She states her blood sugars remain elevated, feeling "sick" and if levels are below 250, but she is trying. She has made no attempt to decrease her smoking stating that we "can't take away her food in her cigarettes". She has been compliant with offloading using the PEG assist you. She is using Santyl daily. the culture obtained last week grew staph aureus and Enterococcus faecalis; continues on the doxycycline and Augmentin was added on Monday. The suprapubic area has erythema, no femoral variation, purple discoloration, minimal induration, was accessed with [name] cotton tip applicator with sanguinopurulent drainage, this was cultured, I suspect the [name], [name] ([id number]) [phone].pdf Page 3 of 20 current antibiotic treatment will cover and we will not add anything to her current treatment plan. She was advised to go to urgent care or ER with any change in redness, induration  or fever. 02/01/18-She is here in follow-up evaluation for left great toe ulcers and [name] new abdominal abscess from last week. She was able to use packing until earlier this week, where she "forgot it was there". She states she was feeling ill with GI symptoms last week and was not taking her antibiotic. She states her glucose levels have been predominantly less than 200, with occasional levels between 200-250. She thinks this was contributing to her GI symptoms as they have resolved without intervention. There continues to be significant laceration to left toe, otherwise it clinically  looks stable/improved. There is now less superficial opening to the lateral aspect of the great toe that was residual blister. We will transition to [facility] to all wounds, she will continue her Augmentin. If there is no change or deterioration next week for reculture. 02/08/18-She is here in follow-up evaluation for left great toe ulcer and abdominal ulcer. There is an improvement in both wounds. She has been wrapping her left toe with coban, not by our direction, which has created an area of discoloration to the medial aspect; she has been advised to NOT use coban secondary to her neuropathy. She states her glucose levels have been high over this last week ranging from 200-350, she continues to smoke. She admits to being less compliant with her offloading shoe. We will continue with same treatment plan and she will follow-up next week. 02/15/18-She is here in follow-up evaluation for left great toe ulcer and abdominal ulcer. The abdominal ulcer is epithelialized. The left great toe ulcer is improved and all injury from last week using the Coban wrap is resolved, the lateral ulcer is healed. She admits to noncompliance with wearing offloading shoe and admits to glucose levels being greater than 300 most of the week. She continues to smoke and expresses no desire to quit. There is one area medially that probes deeper  than it has historically, erythema to the toe and dorsal foot has consistently waxed and waned. There is no overt signs of cellulitis or infection but we will culture the wound for any occult infection given the new area of depth and erythema. We will hold off on sensitivities for initiation of antibiotic therapy. 02/22/18-She is here in follow up evaluation for left great toe ulcer. There is overall significant improvement in both wound appearance, erythema and edema with changes made last week. She was not initiated on antibiotic therapy. Culture obtained last week showed oxacillin sensitive staph aureus, sensitive to clindamycin. Clindamycin has been called into the pharmacy but she has been instructed to hold off on initiation secondary to overall clinical improvement and her history of antibiotic intolerance. She has been instructed to contact the clinic with any noted changes/deterioration and the wound, erythema, edema and/or pain. She will follow-up next week. She continues to smoke and her glucose levels remain elevated >250; she admits to compliance with offloading shoe 03/01/18 on evaluation today patient appears to be doing fairly well in regard to her left first toe ulcer. She has been tolerating the dressing changes with the [facility] Dressing without complication and overall this has definitely showed signs of improvement according to records as well is what the patient tells me today. I'm very pleased in that regard. She is having no pain today 03/08/18 She is here for follow up evaluation of [name] left great toe ulcer. She remains non-compliant with glucose control and smoking cessation; glucose levels consistently >200. She states that she got new shoe inserts/peg assist. She admits to compliance with offloading. Since my last evaluation there is significant improvement. We will switch to prisma at this time and she will follow up next week. She is noted to be tachycardic at this  appointment, heart rate 120s; she has [name] history of heart rate 70-130 according to our records. She admits to extreme agitation r/t personal issues; she was advised to monitor her heartrate and contact her physician if it does not return to [name] more normal range (<100). She takes cardizem twice daily. 03/15/18-She is here in follow-up evaluation for left great toe  ulcer. She remains noncompliant with glucose control and smoking cessation. She admits to compliance with wearing offloading shoe. The ulcer is improved/stable and we will continue with the same treatment plan and she will follow-up next week 03/22/18-She is here for evaluation for left great toe ulcer. There continues to be significant improvement despite recurrent hyperglycemia (over 500 yesterday) and she continues to smoke. She has been compliant with offloading and we will continue with same treatment plan and she will follow-up next week. 03/29/18-She is here for evaluation for left great toe ulcer. Despite continuing to smoke and uncontrolled diabetes she continues to improve. She is compliant with offloading shoe. We will continue with the same treatment plan and she will follow-up next week 04/05/18- She is here in follow up evaluation for [name] left great toe ulcer; she presents with small pustule to left fifth toe (resembles ant bite). She admits to compliance with wearing offloading shoe; continues to smoke or have uncontrolled blood glucose control. There is more callus than usual with evidence of bleeding; she denies known trauma. 04/12/18-She is here for evaluation of left great toe ulcer. Despite noncompliance with glycemic control and smoking she continues to make improvement. She continues to wear offloading shoe. The pustule, that was identified last week, to the left fifth toe is resolved. She will follow-up in 2 weeks 05/03/18-she is seen in follow-up evaluation for [name] left great toe ulcer. She is compliant with offloading, otherwise  noncompliant with glycemic control and smoking. She has plateaued and there is minimal improvement noted. We will transition to [facility], replaced the insert to her surgical shoe and she will follow-up in one week 05/10/18- She is here in follow up evaluation for [name] left great toe ulcer. It appears stable despite measurement change. We will continue with same treatment plan and follow up next week. 05/24/18-She is seen in follow-up evaluation for [name] left great toe ulcer. She remains compliant with offloading, has made significant improvement in her diet, decreasing the amount of sugar/soda. She said her recent A1c was 10.9 which is lower than. She did see [name] diabetic nutritionist/educator yesterday. She continues to smoke. We will continue with the same treatment plan and she'll follow-up next week. 05/31/18- She is seen in follow-up evaluation for left great toe ulcer. She continues to remain compliant with offloading, continues to make improvement in her diet, increasing her water and decreasing the amount of sugar/soda. She does continue to smoke with no desire to quit. We will apply Prisma to the depth and Hydrofera Blue over. We have not received insurance authorization for oasis. She will follow up next week. 06/07/18-She is seen in follow-up evaluation for left great toe ulcer. It has stalled according to today's measurements although base appears stable. She says she saw [name] diabetic educator yesterday; her average blood sugars are less than 300 which is an improvement for her. She continues to smoke and states "that's my next step" She continues with water over soda. We will order for xray, culture and reinstate ace wrap compression prior to placing apligraf for next week. She is voicing no complaints or concerns. Her dressing will change to iodoflex over the next week in preparation for apligraf. 06/14/18-She is seen in follow-up evaluation for left great toe ulcer. Plain film x-ray performed last  week was negative for osteomyelitis. Wound culture obtained last week grew strep B and OSSA; she is initiated on keflex and cefdinir today; there is erythema to the toe which could be from ace  wrap compression, she has [name] history of wrapping too tight and has has been encouraged to maintain ace wraps that we place today. We will hold off on application of apligraf today, will apply next week after antibiotic therapy has been initiated. She admits today that she has resumed taking [name] shower with her foot/toe submerged in water, she has been reminded to keep foot/toe out of the bath water. She will be seen in follow up next week 06/21/18-she is seen in follow-up evaluation for left great toe ulcer. She is tolerating antibiotic therapy with no GI disturbance. The wound is stable. Apligraf was applied today. She has been decreasing her smoking, only had 4 cigarettes yesterday and 1 today. She continues being more compliant in diabetic diet. She will follow-up next week for evaluation of site, if stable will remove at 2 weeks. 06/28/18- She is here in follow up evalution. Apligraf was placed last week, she states the dressing fell off on Tuesday and she was dressing with hydrofera blue. She is healed and will be discharged from the clinic today. She has been instructed to continue with smoking cessation, continue monitoring glucose levels, offloading for an additional 4 weeks and continue with hydrofera blue for additional two weeks for any possible microscopic opening. Readmission: 08/07/18 on evaluation today patient presents for reevaluation concerning the ulcer of her right great toe. She was previously discharged on 06/28/18 healed. Nonetheless she states that this began to show signs of drainage she subsequently went to her primary care provider. Subsequently an x-ray was performed on 08/01/18 which was negative. The patient was also placed on antibiotics at that time. Fortunately they should have been  effective for the infection. Nonetheless she's been experiencing some improvement but still has [name] lot of drainage coming from the wound itself. 08/14/18 on evaluation today patient's wound actually does show signs of improvement in regard to the erythema at this point. She has completed the antibiotics. With that being said we did discuss the possibility of placing her in [name] total contact cast as of today although I think that I may want to give this just [name] little bit more time to ensure nothing recurrence as far as her infection is concerned. I do not want to put in the cast and risk infection at that time if things are not completely resolved. With that being said she is gonna require some debridement today. 08/21/18 on evaluation today patient actually appears to be doing okay in regard to her toe ulcer. She's been tolerating the dressing changes without complication. With that being said it does appear that she is ready and in fact I think it's appropriate for [place] to go ahead and initiate the total contact cast today. Nonetheless she will require some sharp debridement to prepare the wound for application. Overall I feel like things have been progressing well but we do need to do something to get this to close more readily. 08/24/18 patient seen today for reevaluation after having had the total contact cast applied on Tuesday. She seems to have done very well the wound appears to be doing great and overall I'm pleased with the progress that she's made. There were no abnormal areas of rubbing from the cast on her lower extremity. 08/30/18 on evaluation today patient actually appears to be completely healed in regard to her plantar toe ulcer. She tells me at this point she's been having [name] lot [name], [name] ([id number]) [id number] Page 4 of 20 of issues with the cast.  She almost fell [name] couple of times the state shall the step of her dog [name] couple times as well. This is been [name]  very frustrating process for her other nonetheless she has completely healed the wound which is excellent news. Overall there does not appear to be the evidence of infection at this time which is great news. 09/11/18 evaluation today patient presents for follow-up concerning her great toe ulcer on the left which has unfortunately reopened since I last saw her which was only [name] couple of weeks ago. Unfortunately she was not able to get in to get the shoe and potentially the AFO that's gonna be necessary due to her left foot drop. She continues with offloading shoe but this is not enough to prevent her from reopening it appears. When we last had her in the total contact cast she did well from [name] healing standpoint but unfortunately the wound reopened as soon as she came out of the cast within just [name] couple of weeks. 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 This is also part of the reason why she falls on [name] regular basis. I just do not believe that is gonna be safe for [place] to reinitiate the total contact cast as last time we had this on she fell 3 times one week which is definitely not normal for her. 09/18/18 upon evaluation today the patient actually appears to be doing about the same in regard to her toe ulcer. She did not contact [facility] as I asked her to even though I had given her the prescription. In fact she actually states that she has no idea where the prescription is. She did apparently call [facility] and they told her that all she needed to do was bring the prescription in order to be able to be seen and work on getting the AFO for her left foot. With all that being said she still does not have an appointment and I'm not sure were things stand that regard. I will give her [name] new prescription today in  order to contact them to get this set up. 09/25/18 on evaluation today patient actually appears to be doing about the same in regard to her toes ulcer. She does have [name] small areas which seems to have [name] lot of callous buildup around the edge of the wound which is going to need sharp debridement today. She still is waiting to be scheduled for evaluation with [facility] for possibility of an AFO. She states there supposed to call her tomorrow to get this set up. Unfortunately it does appear that her foot specifically the toe area is showing signs of erythema. There does not appear to be any systemic infection which is in these good news. 10/02/18 on evaluation today patient actually appears to be doing about the same in regard to her toe ulcer. This really has not done too well although it's not significantly larger it's also not significantly smaller. She has been tolerating the dressing changes without complication. She actually has her appointment with [facility] and [facility] tomorrow to hopefully be measured for obtaining and AFO splint. I think this would be helpful preventing this from reoccurring. We had contemplated starting the cast this week although to be honest I am reluctant to do that as  she's been having nausea, vomiting, and seizure activity over the past three days. She has [name] history of seizures and have been told is nothing that can be done for these. With that being said I do believe that along with the seizures have the nausea vomiting which upon further questioning doesn't seem to be the normal for her and makes me concerned for the possibility of infection or something else going on. I discussed this with the patient and her mother during the office visit today. I do not feel the wound is effective but maybe something else. The responses this was "this just happens to her at times and we don't know why". They did not seem to be interested in going to the hospital to have this checked out  further. 10/09/18 on evaluation today patient presents for follow-up concerning her ongoing toe ulcer. She has been tolerating the dressing changes without complication. Fortunately there does not appear to be any evidence of infection which is great news however I do think that the patient would benefit from going ahead for with the total contact cast. She's actually in [name] wheelchair today she tells me that she will use her walker if we initiate the cast. I was very specific about the fact that if we were gonna do the cast I wanted to make sure that she was using the walker in order to prevent any falls. She tells me she does not have stairs that she has to traverse on [name] regular basis at her home. She has not had any seizures since last week again that something that happens to her often she tells me she did talk to [employer] and they said that it may take up to three weeks to get the brace approved for her. Hopefully that will not take that long but nonetheless in the meantime I do think the cast could be of benefit. 10/12/18 on evaluation today patient appears to be doing rather well in regard to her toe ulcer. It's just been [name] few days and already this is significantly improved both as far as overall appearance and size. Fortunately there's no sign of infection. She is here for her first obligatory cast change. 10/19/18 Seen today for follow up and management of left great toe ulcer. Wound continues to show improvement. Noted small open area with seroussang drainage with palpation. Denies any increased pain or recent fevers during visit. She will continue calcium alginate with offloading shoe. Denies any questions or concerns during visit. 10/26/18 on evaluation today patient appears to be doing about the same as when I last saw her in regard to her wound bed. Fortunately there does not appear to be any signs of infection. Unfortunately she continues to have [name] breakdown in regard to the toe region any  time that she is not in the cast. It takes almost no time at all for this to happen. Nonetheless she still has not heard anything from the brace being made by [employer] as to when exactly this will be available to her. Fortunately there is no signs of infection at this time. 10/30/18 on evaluation today patient presents for application of the total contact cast as we just received him this morning. Fortunately we are gonna be able to apply this to her today which is great news. She continues to have no significant pain which is good news. Overall I do feel like things have been improving while she was the cast is when she doesn't have [name] cast that  things get worse. She still has not really heard anything from [employer] regarding her brace. 11/02/18 upon evaluation today patient's wound already appears to be doing significantly better which is good news. Fortunately there does not appear to be any signs of infection also good news. Overall I do think the total contact cast as before is helping to heal this area unfortunately it's just not gonna likely keep the area closed and healed without her getting her brace at least. Again the foot drop is [name] significant issue for her. 11/09/18 on evaluation today patient appears to be doing excellent in regard to her toe ulcer which in fact is completely healed. Fortunately we finally got the situation squared away with the paperwork which was needed to proceed with getting her brace approved by Medicaid. I have filled that out unfortunately that information has been sent to the orthopedic office that I worked at 2 1/2 years ago and not tired Current wound care measures. Fortunately she seems to be doing very well at this time. 11/23/18 on evaluation today patient appears to be doing More Poorly Compared to Last Time I Saw Her. At [facility] She Had Completely Healed. Currently she is continuing to have issues with reopening. She states that she just found out that the brace  was approved through Medicaid now she just has to go get measured in order to have this fitted for her and then made. Subsequently she does not have an appointment for this yet that is going to complicate things we obviously cannot put her back in the cast if we do not have everything measured because they're not gonna be able to measure her foot while she is in the cast. 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 Obviously this is unfortunate and does have me somewhat worried as well. 11/30/18 on evaluation today patient's toe ulcer actually appears to be doing fairly well. The good news is she will be getting her brace in the shoes next week on Wednesday. Hopefully we will be able to get this to heal without having to go back in the cast however she may need the cast in order to get the wound completely heal and then go from there. Fortunately there's no signs of infection at this time. 12/07/18 on evaluation today patient fortunately did receive her brace and she states she could tell this definitely makes her walk better. With that being said she's been having issues with her toe where she noticed yesterday there was [name] lot of tissue that was loosing off this appears to be much larger than what it was previous. She also states that her leg has been read putting much across the top of her foot just about the ankle although this seems to be receiving somewhat. The total area is still red and appears to be someone infected as best I can tell. She is previously taken Bactrim and that may be [name] good option for her today as well. We are gonna see what I wound culture shows as well and I think that this is definitely appropriate. With that being said outside of the culture I still need to initiate something in the interim and that's what I'm gonna go ahead and select Bactrim is [name] good option for her. 12/14/18 on  evaluation today patient appears to be doing better in regard to her left great toe ulcer  as compared to last week's evaluation. There's still some erythema although this is significantly improved which is excellent news. Overall I do believe that she is making good progress is still gonna take some time before she is where I would like her to be from the standpoint of being able to place her back into the total contact cast. Hopefully we will be where we need to be by next week. 12/21/18 on evaluation today patient actually appears to be doing poorly in regard to her toe ulcer. She's been tolerating the dressing changes without complication. Fortunately there's no signs of systemic infection although she does have [name] lot of drainage from the toe ulcer and this does seem to be causing some issues at this point. She does have erythema on the distal portion of her toe that appears to be likely cellulitis. [name], [name] ([id number]) [id number] Page 5 of 20 12/28/18 on evaluation today patient actually appears to be doing [name] little better in my pinion in regard to her toe ulcer. With that being said she still does have some evidence of infection at this time and for her culture she had both E. coli as well as enterococcus as organisms noted on evaluation. For that reason I think that though the Keflex likely has treated the E. coli rather well this has really done nothing for the enterococcus. We are going to have to initiate treatment for this specifically. 01/04/19 on evaluation today patient's toe actually appears to be doing better from the standpoint of infection. She currently would like to see about putting the cash back on I think that this is appropriate as long as she takes care of it and keeps it from getting wet. She is gonna have some drainage we can definitely pass this up with Drawtex and alginate to try to prevent as much drainage as possible from causing the  problems. With that being said I do want to at least try her with the cast between now and Tuesday. If there any issues we can't continue to use it then I will discontinue the use of the cast at that point. 01/08/19 on evaluation today patient actually appears to be doing very well as far as her foot ulcer specifically the great toe on the left is concerned. She did have an area of rubbing on the medial aspect of her left ankle which again is from the cast. Fortunately there's no signs of infection at this point in this appears to be [name] very slight skin breakdown. The patient tells me she felt it rubbing but didn't think it was that bad. Fortunately there is no signs of active infection at this time which is good news. No fevers, chills, nausea, or vomiting noted at this time. 01/15/19 on evaluation today patient actually appears to be doing well in regard to her toe ulcer. Again as previous she seems to do well and she has the cast on which indicates to me that during the time she doesn't have [name] cast on she's putting way too much pressure on this region. Obviously I think that's gonna be an issue as with the current national emergency concerning the Covid-19 Virus it has been recommended that we discontinue the use of total contact casting by the chief medical officer of our company, Dr. Maurine Minister. The reasoning is that if [name] patient becomes sick and cannot come into have the cast removed they could not just leave this on for an additional two weeks. Obviously the hospitals also  do not want to receive patient's who are sick into the emergency department to potentially contaminate the region and spread the Covid-19 Virus among other sick individuals within the hospital system. Therefore at this point we are suspending the use of total contact cast until the current emergency subsides. This was all discussed with the patient today as well. 01/22/19 on evaluation today patient's wound on her left great toe  appears to be doing slightly worse than previously noted last week. She tells me that she has been on this quite [name] bit in fact she tells me she's been awake for 38 straight hours. This is due to the fact that she's having to care for grandparents because nobody else will. She has been taking care of them for five the last seven days since I've seen her they both have dementia his is from [name] stroke and her grandmother's was progressive. Nonetheless she states even her mom who knows her condition and situation has only help two of those days to take care of them she's been taking care of the rest. Fortunately there does not appear to be any signs of active infection in regard to her toe at this point although obviously it doesn't look as good as it did previous. I think this is directly related to her not taking off the pressure and friction by way of taking things easy. Though I completely understand what's going on. 01/29/19 on evaluation today patient's tools are actually appears to be showing some signs of improvement today compared to last week's evaluation as far as not necessarily the overall size of the wound but the fact that she has some new skin growth in between the two ends of the wound opening. Overall I feel like she has done well she states that she had [name] family member give her what sounds to be [name] CAM walker boot which has been helpful as well. 02/05/19 on evaluation today patient's wound bed actually appears to be doing significantly better in regard to her overall appearance of the size of the wound. With that being said she is still having an issue with offloading efficiently enough to get this to close. Apparently there is some signs of infection at this point as well unfortunately. Previously she's done well of Augmentin I really do not see anything that needs to be culture currently but there theme and cellulitis of the foot that I'm seeing I'm gonna go ahead and place her on an  antibiotic today to try to help clear this up. 02/12/2019 on evaluation today patient actually appears to be doing poorly in regard to her overall wound status. She tells me she has been using her offloading shoe but actually comes in today wearing her tennis shoe with the AFO brace. Again as I previously discussed with her this is really not sufficient to allow the area to heal appropriately. Nonetheless she continues to be somewhat noncompliant and I do wonder based on what she has told my nurse in the past as to whether or not [name] good portion of this noncompliance may be recreational drug and alcohol related. She has had [name] history of heroin overdose and this was fairly recently in the past couple of months that have been seeing her. Nonetheless overall I feel like her wound looks significantly worse today compared to what it was previous. She still has significant erythema despite the Augmentin I am not sure that this is an appropriate medication for her infection I am also concerned  that the infection may have gone down into her bone. 02/19/19 on evaluation today patient actually appears to be doing about the same in regard to her toe ulcer. Unfortunately she continues to show signs of bone exposure and infection at this point. There does not appear to be any evidence of worsening of the infection but I'm also not really sure that it's getting significantly better. She is on the Augmentin which should be sufficient for the Staphylococcus aureus infection that she has at this point. With that being said she may need IV antibiotics to more appropriately treat this. We did have [name] discussion today about hyperbaric option therapy. 02/28/19 on evaluation today patient actually appears to be doing much worse in regard to the wound on her left great toe as compared to even my previous evaluation last week. Unfortunately this seems to be training in [name] pretty poor direction. Her toe was actually now starting to  angle laterally and I can actually see the entire joint area of the proximal portion of the digit where is the distal portion of the digit again is no longer even in contact with the joint line. Unfortunately there's [name] lot more necrotic tissue around the edge and the toe appears to be showing signs of becoming gangrenous in my pinion. I'm very concerned about were things stand at this point. She did see infectious disease and they are planning to send in [name] prescription for Sivextro for her and apparently this has been approved. With that being said I don't think she should avoid taking this but at the same time I'm not sure that it's gonna be sufficient to save her toe at this point. She tells me that she still having to care for grandparents which I think is putting quite [name] bit of strain on her foot and specifically the total area and has caused this to break down even to [name] greater degree than would've otherwise been expected. 03/05/19 on evaluation today patient actually appears to be doing quite well in regard to her toe all things considering. She still has bone exposed but there appears to be much less your thing on overall the appearance of the wound and the toe itself is dramatically improved. She still does have some issues currently obviously with infection she did see vascular as well and there concerned that her blood flow to the toad. For that reason they are setting up for an angiogram next week. 03/14/19 on evaluation today patient appears to be doing very poor in regard to her toe and specifically in regard to the ulceration and the fact that she's starting to notice the toe was leaning even more towards the lateral aspect and the complete joint is visible on the proximal aspect of the joint. Nonetheless she's also noted [name] significant odor and the tip of the toe is turning more dark and necrotic appearing. Overall I think she is getting worse not better as far as this is concerned. For  that reason I am recommending at this point that she likely needs to be seen for likely amputation. READMISSION 03/19/2021 This is [name] patient that we cared for in this clinic for [name] prolonged period of time in 2019 and 2020 with [name] left foot and left first toe wound. 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 Excell Seltzer. In December 2021 she had an ulcer on her right  great toe as well as the fourth and fifth toes. 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 She also had an angiogram at that time and underwent angioplasty of the right anterior tibial artery. In any case she claims that the wound on the right foot is closed I did not look at this today which was probably an oversight although I think that should be done next week. 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 According to Dr. Bernette Redbird last review that she was out of the area being cared for by another physician but recently came back to his attention. 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 [name] culture of this area showed E. coli apparently before she came back to see Dr. Excell Seltzer she was supposed to be receiving antibiotics but she did not really take them. Nor is she offloading this area at all. Finally her last hemoglobin A1c listed in epic was in March 2022 at 14.1 she says things are [name] lot better since then although I am not sure. 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 She had this wound at the time. During this admission she had E. coli sepsis [name] TEE was negative. 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 She is still [name] heavy cigarette smoker. She has [name] history of bipolar disorder seizure disorder FUMIYE, LUBBEN [name] ([id number]) [id number] Page 6 of 20 ABI in our clinic was 1.05 6/1; left midfoot in the setting of [name] TMA done previously. Round circular wound with [name] "knuckle" of protruding tissue. The problem is that the knuckle was not attached to any of the surrounding granulation and this probed proximally widely I removed [name] large portion of this tissue. This wound goes with considerable undermining laterally. I do not feel any bone there was no purulence but this is [name] deep wound. 6/8; in spite of the debridement I did last week. She arrives with [name] wound looking exactly the same. [name] protruding "knuckle" of tissue nonadherent to most of the surrounding tissue. There is considerable depth around this from 6-12 o'clock at 2.7 cm and undermining of 1 cm. This does not look overtly infected and the x- ray I did last week was negative for any osseous abnormalities. We have been using silver collagen 6/15; deep tissue culture I did last week showed moderate staph aureus and moderate Pseudomonas. This will definitely require prolonged antibiotic therapy. The pathology on the protuberant area was negative for malignancy fungus etc. 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 We have been using silver collagen. I am going to be prescribing Levaquin for 2 weeks. Her CT scan of the foot is down for 7/5 6/22; CT scan of the foot on 7 5. She says she has hardware in the left leg from her previous fracture. She is on the Levaquin for the deep tissue culture I did that showed methicillin sensitive staph aureus and Pseudomonas. I gave her [name] 2-week supply and she will have another week. She arrives in clinic today with the same  protuberant tissue however this is nonadherent to the tissue surrounding it. I  am really at [name] loss to explain this unless there is underlying deep tissue infection 6/29; patient presents for 1 week follow-up. She has been using collagen to the wound bed. She reports taking her antibiotics as prescribed.She has no complaints or issues today. She denies signs of infection. 7/6; patient presents for one week followup. She has been using collagen to the wound bed. She states she is taking Levaquin however at times she is not able to keep it down. She denies signs of infection. 7/13; patient presents for 1 week follow-up. She has been using silver alginate to the wound bed. She still has nausea when taking her antibiotics. She denies signs of infection. 7/20; patient presents for 1 week follow-up. She has been using silver alginate with gentamicin cream to the wound bed. She denies any issues and has no complaints today. She denies signs of infection. 7/27; patient presents for 1 week follow-up. She continues to use silver alginate with gentamicin cream to the wound bed. She reports starting her antibiotics. She has no issues or complaints. Overall she reports stability to the wound. 8/3; patient presents for 1 week follow-up. She has been using silver alginate with gentamicin cream to the wound bed. She reports completing all antibiotics. She has no issues or complaints today. She denies signs of infection. 8/17; patient presents for 2-week follow-up. He is to use silver alginate to the wound bed. She has no issues or complaints today. She denies signs of infection. She reports her pain has improved in her foot since last clinic visit 8/24; patient presents for 1 week follow-up. She continues to use silver alginate to the wound bed. She has no issues or complaints. She denies signs of infection. Pain is stable. 9/7; patient presents for follow-up. She missed her last week appointment due to feeling  ill. She continues to use silver alginate. She has [name] new wound to the right lower extremity that is covered in eschar. She states It occurred over the past week and has no idea how it started. She currently denies signs of infection. 9/14; patient presents for follow-up. T the left foot wound she has been using gentamicin cream and silver alginate. T the right lower extremity wound she has o o been keeping this covered and has not obtain Santyl. 9/21; patient presents for follow-up. She reports using gentamicin cream and silver alginate to the left foot and Santyl to the right lower extremity wound. She has no issues or complaints today. She denies signs of infection. 9/28; patient presents for follow-up. She reports [name] new wound to her right heel. She states this occurred [name] few days ago and is progressively gotten worse. She has been trying to clean the area with [name] Q-tip and Santyl. She reports stability in the other 2 wounds. She has been using gentamicin cream and silver alginate to the left foot and Santyl to the right lower extremity wound. 10/12; patient presents for follow-up. She reports improvement to the wound beds. She is seeing vein and vascular to discuss the potential of [name] left BKA. She states they are going to do an arteriogram. She continues to use silver alginate with dressing changes to her wounds. 11/2; patient presents for follow-up. She states she has not been doing dressing changes to the wound beds. She states she is not able to offload the areas. She reports chronic pain to her left foot wound. 11/9; patient presents for follow-up. She came in with only socks on. She states she forgot  to put on shoes. It is unclear if she is doing any dressing changes. She currently denies systemic signs of infection. 11/16; patient presents for follow-up. She came again only with socks on. She states she does not wear shoes ever. It is unclear if she does dressing changes. She currently  denies systemic signs of infection. 11/23; patient presents for follow-up. She wore her shoes today. It still unclear exactly what dressing she is using for each wound but she did states she obtained Dakin's solution and has been using this to the left foot wound. She currently denies signs of infection. 11/30; patient presents for follow-up. She has no issues or complaints today. She currently denies signs of infection. 12/7; patient presents for follow-up. She has no issues or complaints today. She has been using Hydrofera Blue to the right heel wound and Dakin solution to the left foot wound. Her right anterior leg wound is healed. She currently denies signs of infection. 12/14; patient presents for follow-up. She has been using Hydrofera Blue to the right heel and Dakin's to the left foot wounds. She has no issues or complaints today. She denies signs of infection. 12/21; patient presents for follow-up. She reports using Hydrofera Blue to the right heel and Dakin's to the left foot wound. She denies signs of infection. 12/28; patient presents for follow-up. She continues to use Dakin's to the left foot wound and Hydrofera Blue to the right heel wound. She denies signs of infection. 1/4; patient presents for follow-up. She has no issues or complaints today. She denies signs of infection. 1/11; patient presents for follow-up. It is unclear if she has been dressing these wounds over the past week. She currently denies signs of infection. 1/18; patient presents for follow-up. She states she has been using Dakin's wet-to-dry dressings to the left foot. She has been using Hydrofera Blue to the right foot foot wound. She states that the anterior right leg wound has reopened and draining serous fluid. She denies signs of infection. 1/25; patient presents for follow-up. She has no issues or complaints today. 2/1; patient presents for follow-up. She has no issues or complaints today. She denies signs of  infection. 2/8; patient presents for follow-up. She has lost her surgical shoes. She did not have [name] dressing to the right heel wound. She currently denies signs of infection. 2/15; patient presents for follow-up. She reports more pain to the right heel today. She denies purulent drainage Or fever/chills 2/22; patient presents for follow-up. She reports taking clindamycin over the past week. She states that she continues to have pain to her right heel. She reports purulent drainage. Readmission 03/16/2022 [name], [name] ([id number]) [phone].pdf Page 7 of 20 [name] is [name] 47 year old female with [name] past medical history of type 2 diabetes, osteomyelitis to her feet, chronic systolic heart failure and bipolar disorder that presents to the clinic for bilateral feet wounds and right lower extremity wound. She was last seen in our clinic on 12/15/2021. At that time she had purulent drainage coming out of her right plantar foot and I recommended she go to the ED. She states she went to [facility] and has been there for the past 3 months. I cannot see the records. She states she had OR debridement and was on several weeks of IV antibiotics while inpatient. Since discharge she has not been taking care of the wound beds. She had nothing on her feet other than socks today. She currently denies signs of infection.  5/31; patient presents for follow-up. She has been using Dakin's wet-to-dry dressings to the wound beds on her feet bilaterally and antibiotic ointment to the right anterior leg wound. She had [name] wound culture done at last clinic visit that showed moderate Pseudomonas aeruginosa sensitive to ciprofloxacin. She currently denies systemic signs of infection. 6/14; patient presents for follow-up. She received Keystone 5 days ago and has been using this on the wound beds. She states that last week she had to go to the hospital because she had increased warmth  and erythema to the right foot. She was started on 2 oral antibiotics. She states she has been taking these. She currently denies systemic signs of infection. She has no issues or complaints today. 6/21; patient presents for follow-up. She states she has been using Keystone antibiotics to the wound beds. She has no issues or complaints today. She denies signs of infection. 6/28; patient presents for follow-up. She has been using Keystone antibiotics to the wound beds. She has no issues or complaints today. 7/12; patient presents for follow-up. Has been using Keystone antibiotics to the wound beds with calcium alginate. She has no issues or complaints today. She never followed up with her orthopedic surgeon who did the OR debridement to the right foot. We discussed the total contact cast for the left foot and patient would like to do this next week. 7/19; patient presents for follow-up. She has been using Keystone antibiotics with calcium alginate to the wound beds. She has no issues or complaints today. Patient is in agreement to do the total contact cast of the left foot today. She knows to return later this week for the obligatory cast change. 05-13-2022 upon evaluation today patient's wound which she has the cast of the left leg actually appears to be doing significantly better. Fortunately I do not see any signs of active infection locally or systemically which is great news and overall I am extremely pleased with where we stand currently. 7/26; patient presents for follow-up. She has [name] cast in place for the past week. She states it irritated her shin. Other than that she tolerated the cast well. She states she would like [name] break for 1 week from the cast. We have been using Keystone antibiotic and Aquacel to both wound beds. She denies signs of infection. 8/2; patient presents for follow-up. She has been using Keystone and Aquacel to the wound beds. She denies any issues and has no complaints. She  is agreeable to have the cast placed today for the left leg. 06-03-2022 upon evaluation today patient appears to be doing well with regard to her wound she saw some signs of improvement which is great news. Fortunately I do not see any evidence of active infection locally or systemically at this time which is great news. No fevers, chills, nausea, vomiting, or diarrhea. 8/16; patient presents for follow-up. She has no issues or complaints today. We have been using Keystone and Aquacel to the wound beds. The left lower extremity is in [name] total contact cast. She is tolerated this well. 8/23; patient presents for follow-up. She has had the total contact cast on the left leg for the past week. Unfortunately this has rubbed and broken down the skin to the medial foot. She currently denies signs of infection. She has been using Keystone antibiotic to the right plantar foot wound. 8/30; patient presents for follow-up. We have held off on the total contact cast for the left leg for the past week. Her  wound on the left foot has improved and the previous surrounding breakdown of skin has epithelialized. She has been using Keystone antibiotic to both wound beds. She has no issues or complaints today. She denies signs of infection. 9/6; patient presents for follow-up. She has ordered her's Keystone antibiotic and this is arriving this week. She has been doing Dakin's wet-to-dry dressings to the wound beds. She denies signs of infection. She is agreeable to the total contact cast today. 9/13; patient presents for follow-up. She states that the cast caused her left leg shin to be sore. She would like to take [name] break from the cast this week. She has been using Keystone antibiotic to the right plantar foot wound. She denies signs of infection. 9/20; patient presents for follow-up. She has been using Keystone antibiotics to the wound beds with calcium alginate to the right foot wound and Hydrofera Blue to the left  foot wound. She is agreeable to having the cast placed today. She has been approved for Apligraf and we will order this for next clinic visit. 9/27; patient presents for follow-up. We have been using Keystone antibiotic with Hydrofera Blue to the left foot wound under [name] total contact cast. T the right o foot wound she has been using Keystone antibiotic and calcium alginate. She declines [name] total contact cast today. Apligraf is available for placement and she would like to proceed with this. 07-28-2022 upon evaluation today patient appears to be doing well currently in regard to her wound. She is actually showing signs of significant improvement which is great news. Fortunately I do not see any evidence of active infection locally nor systemically at this time. She has been seeing Dr. Mikey Bussing and to be honest has been doing very well with the cast. Subsequently she comes in today with [name] cast on and we did reapply that today as well. She did not really want to she try to talk me out of that but I explained that if she wanted to heal this is really the right way to go. Patient voiced understanding. In regard to her right foot this is actually [name] lot better compared to the last time I saw her which is also great news. 10/11; patient presents for follow-up. Apligraf and the total contact cast was placed to the left leg at last clinic visit. She states that her right foot wound had burning pain to it with the placement of Apligraf to this area. She has been doing [place] over this area. She denies signs of infection including increased warmth, erythema or purulent drainage. 11/1; 3-week follow-up. The patient fortunately did not have [name] total contact cast or an Apligraf and on the left foot. She has been using Keystone ABD pads and kerlix and her own running shoes She arrives in clinic today with thick callus and [name] very poor surface on the left foot on the right nonviable skin subcutaneous tissue and [name] deep  probing hole. 11/15; patient missed her last clinic appointment. She states she has not been dressing the wound beds for the past 2 weeks. She states that at she had [name] new roommate but is now going back to live with her mother. Apparently its been [name] distracting 2 weeks. Patient currently denies signs of infection. 11/22; patient presents for follow-up. She states she has been using Keystone antibiotic and Dakin's wet-to-dry dressings to the wound beds. She is agreeable for cast placement today. We had ordered Apligraf however this has not been received by our  facility. 11/29; Patient had [name] total contact cast placed at last clinic visit and she tolerated this well. We were using silver alginate under the cast. Patient's been using Keystone antibiotic with Aquacel to the right plantar foot wound. She has no issues or complaints today. Apligraf is available for placement today. Patient would like to proceed with this. 12/6; patient presents for follow-up. She had Apligraf placed in standard fashion last clinic visit under the total contact cast to the left lower extremity. She has been using Keystone antibiotic and Aquacel to the right plantar foot wound. She has no issues or complaints today. 12/13; patient presents for follow-up. She has finished 5 Apligraf placements. Was told she would not qualify for more. We have been doing [name] total contact Gatchalian, Lauralei [name] ([id number]) [phone].pdf Page 8 of 20 cast to the left lower extremity. She has been using Keystone antibiotic and Aquacel to the right plantar foot wound. She has no issues or complaints today. 12/20; patient presents for follow-up. We have been using Hydrofera Blue with Keystone antibiotic under [name] total contact cast of the left lower extremity. She reports using Keystone antibiotic and silver alginate to the right heel wound. She has no issues or complaints today. 12/27; patient presents with [name] healthy wound on the  left midfoot. We have Apligraf to apply that to that more also using [name] total contact cast. On the right we are using Keystone and silver alginate. She is offloading the right heel with [name] surgical shoealthough by her admission she is on her feet quite [name] bit 1/3; patient presents for follow-up. Apligraf was placed to the wound beds last clinic visit. She was placed in [name] total contact cast to the left lower extremity. She declines [name] total contact cast today. She states that her mother is in the hospital and she cannot adequately get around with the cast on. 1/10; patient presents for follow-up. She declined the total contact cast at last clinic visit. Both wounds have declined in appearance. She states that she has been on her feet and not offloading the wound beds. She currently denies signs of infection. 1/17; patient presents for follow-up. She had the total contact cast along with Apligraf placed last week to the left lower extremity. She tolerated this well. She has been using Aquacel Ag and Keystone antibiotic to the right heel wound. She currently denies signs of infection. She has no issues or complaints today. 1/24; patient presents for follow-up. We have been using Apligraf to the left foot wound along with [name] total contact cast. She has done well with this. T the right o heel wound she has been using Aquacel Ag and Keystone antibiotic ointment. She has no issues or complaints today. She denies signs of infection. 1/31; patient presents for follow-up. We have been using Apligraf to the left foot wound along with the total contact cast. She continues to do well with this. To the right heel we have been using Aquacel Ag and Keystone antibiotic ointment. She has no issues or complaints today. 12-01-2022 upon evaluation patient is seen today on my schedule due to the fact that she unfortunately was in the hospital yesterday. Her cast needed to come off the only reason she is out of the hospital is due  to the fact that they would not take it off at the hospital which is somewhat bewildered reading to me to be perfectly honest. I am not certain why this was but either way she was released and  then was placed on my schedule today in order to get this off and reapply the total contact casting as appropriate. I do not have an Apligraf for her today it was applied last week and today's actually expired yesterday as there was some scheduling conflicts with her being in the hospital. Nonetheless we do not have that for reapplication today but the good news that she is not draining too much and the Apligraf can go for up to 2 weeks so I am going to go ahead and reapply the total contact casting but we are going to leave the Apligraf in place. 2/14; patient presents for follow-up. T the left leg she has had the total contact cast and Apligraf for the past week. She has had no issues with this. T the o o right heel she has been using antibiotic ointment and Aquacel Ag. 2/21; patient presents for follow-up. We have been using Apligraf and [name] total contact cast to the left lower extremity. She is tolerated this well. Unfortunately she is not approved for any more Apligraf per insurance. She has been using antibiotic ointment and Aquacel Ag to the right foot. She has no issues or complaints today. 2/28; patient presents for follow-up. We have been using Hydrofera Blue and antibiotic ointment under the total contact cast to the left lower extremity. He has been using Aquacel Ag with antibiotic ointment to the right plantar foot. She has no issues or complaints today. 3/6; patient presents for follow-up. She did not obtain her gentamicin ointment. She has been using Aquacel Ag to the right plantar foot wound. We have been using Hydrofera Blue with antibiotic ointment under the total contact cast to the left lower extremity. She has no issues or complaints today. 3/12; patient presents for follow-up. She has been  using gentamicin ointment and Aquacel Ag to the right plantar foot wound. We have been using Hydrofera Blue with antibiotic ointment under the total contact cast on the left lower extremity foot wound. She has no issues or complaints today. 3/20; patient presents for follow-up. She has been using gentamicin ointment and Aquacel Ag to the right plantar foot wound. We have been using Hydrofera Blue with antibiotic ointment under the total contact cast to the left lower extremity wound. She has no issues or complaints today. 3/27; patient presents for follow-up. We have been using antibiotic ointment and Aquacel Ag to the right plantar foot wound. We have been using Hydrofera Blue with antibiotic ointment under the total contact cast to the left lower extremity. Both wounds are smaller. 01-24-2023 upon evaluation today patient actually appears to be doing better compared to last time I saw her. It has been several weeks since I last saw her but nonetheless I think the total contact casting is doing [name] good job. Fortunately I do not see any evidence of infection or worsening overall which is great news and in general I do believe that she is moving in the appropriate direction. It has been [name] very long road but nonetheless I feel like she is nearing the end at least in regard to the left foot and even the right foot looks much better to be perfectly honest. 01-31-2023 upon evaluation today patient appears to be doing well currently in regard to her wounds. She has been require some sharp debridement which I think is probably bone both sides to remove [name] lot of callus that has built up at this point. I discussed that with her today. Also think total  contact cast is doing well for the left foot Emina continue that as well. 4/17; patient presents for follow-up. We have been doing Hydrofera Blue with antibiotic ointment under the total contact cast to the left lower extremity and Aquacel Ag with antibiotic ointment  to the right foot wound. She has been using her offloading heel shoe here. 4/24; patient presents for follow-up. We have been doing Hydrofera Blue with antibiotic ointment under the total contact cast to the left lower extremity and Aquacel Ag with antibiotic ointment to the right foot wound. She has been using her offloading heel shoe here. Wounds are smaller with slough accumulation. Electronic Signature(s) Signed: 02/15/2023 12:31:39 PM By: Geralyn Corwin DO Entered By: Geralyn Corwin on 02/15/2023 10:54:31 -------------------------------------------------------------------------------- Physical Exam Details Patient Name: Date of Service: [name] 02/15/2023 10:00 [name]: [id number] Patient Account Number: [id number] Date of Birth/Sex: Treating RN: 1976-09-09 (47 y.o. Skip Mayer Primary Care Provider: [name] Other Clinician: [name] Referring Provider: Treating Provider/Extender: Chapman Moss in Treatment: [address] HILDA, RYNDERS [name] ([id number]) [id number] Page 9 of 20 Cardiovascular . Psychiatric . Notes Right foot: T the plantar heel there is an incision site with increased depth, No probing to bone. T the opening there is Nonviable tissue and granulation tissue. o o Left foot: T the medial aspect there is an open wound with granulation tissue, Callus and slough build up. No overt signs of infection to any of the wound o beds. Electronic Signature(s) Signed: 02/15/2023 12:31:39 PM By: Geralyn Corwin DO Entered By: Geralyn Corwin on 02/15/2023 10:55:09 -------------------------------------------------------------------------------- Physician Orders Details Patient Name: Date of Service: [name] 02/15/2023 10:00 [name]: [id number] Patient Account Number: [id number] Date of Birth/Sex: Treating RN: 02-17-1976 (47 y.o. Skip Mayer Primary Care Provider: [name] Other Clinician: [name] Referring Provider: Treating Provider/Extender: Chapman Moss in Treatment: 2 Verbal / Phone Orders: Yes Clinician: Huel Coventry Read Back and Verified: Yes Diagnosis Coding Follow-up Appointments Return Appointment in 1 week. Nurse Visit as needed Bathing/ Shower/ Hygiene May shower with wound dressing protected with water repellent cover or cast protector. No tub bath. Anesthetic (Use 'Patient Medications' Section for Anesthetic Order Entry) Lidocaine applied to wound bed Edema Control - Lymphedema / Segmental Compressive Device / Other Elevate, Exercise Daily and [name] void Standing for Long Periods of Time. Elevate legs to the level of the heart and pump ankles as often as possible Elevate leg(s) parallel to the floor when sitting. Off-Loading Total Contact Cast to Left Lower Extremity Open toe surgical shoe - Heel off loader - Right foot Other: - keep pressure off of feet. Additional Orders / Instructions Follow Nutritious Diet and Increase Protein Intake Other: - Stay off of foot, wheelchair only Wound Treatment Wound #11 - Foot Wound Laterality: Plantar, Right Cleanser: Wound Cleanser 1 x Per Day/30 Days Discharge Instructions: Wash your hands with soap and water. Remove old dressing, discard into plastic bag and place into trash. Cleanse the wound with Wound Cleanser prior to applying [name] clean dressing using gauze sponges, not tissues or cotton balls. Do not scrub or use excessive force. Pat dry using gauze sponges, not tissue or cotton balls. Topical: Gentamicin 1 x Per Day/30 Days Discharge Instructions: Apply as directed by provider. in clinic Prim Dressing: Aquacel Extra Hydrofiber Dressing, 2x2 (in/in) ary 1 x Per Day/30 Days Secondary Dressing: ABD Pad 5x9 (in/in) (Generic) 1 x Per Day/30  Days Discharge Instructions: Cover with ABD pad Secured With: Medipore T - 1M  Medipore H Soft Cloth Surgical T ape ape, 2x2 (in/yd) 1 x Per Day/30 Days Secured With: Kerlix Roll Sterile or Non-Sterile 6-ply 4.5x4 (yd/yd) 1 x Per Day/30 Days Discharge Instructions: Apply Kerlix as directed Wound #12 - Foot Wound Laterality: Plantar, Left, Medial Cleanser: Wound Cleanser 1 x Per Week/30 Days EMMERIE, BATTAGLIA [name] ([id number]) [id number] Page 10 of 20 Discharge Instructions: Wash your hands with soap and water. Remove old dressing, discard into plastic bag and place into trash. Cleanse the wound with Wound Cleanser prior to applying [name] clean dressing using gauze sponges, not tissues or cotton balls. Do not scrub or use excessive force. Pat dry using gauze sponges, not tissue or cotton balls. Topical: Gentamicin 1 x Per Week/30 Days Discharge Instructions: Apply as directed by provider. in clinic Prim Dressing: Hydrofera Blue Ready Transfer Foam, 2.5x2.5 (in/in) 1 x Per Week/30 Days ary Discharge Instructions: Apply Hydrofera Blue Ready to wound bed as directed Secondary Dressing: ABD Pad 5x9 (in/in) (Generic) 1 x Per Week/30 Days Discharge Instructions: Cover with ABD pad Secured With: Medipore T - 1M Medipore H Soft Cloth Surgical T ape ape, 2x2 (in/yd) 1 x Per Week/30 Days Secured With: [employer] or Non-Sterile 6-ply 4.5x4 (yd/yd) 1 x Per Week/30 Days Discharge Instructions: Apply Kerlix as directed Electronic Signature(s) Signed: 02/15/2023 12:31:39 PM By: Geralyn Corwin DO Entered By: Geralyn Corwin on 02/15/2023 10:57:59 -------------------------------------------------------------------------------- Problem List Details Patient Name: Date of Service: [name] 02/15/2023 10:00 [name]: [id number] Patient Account Number: [id number] Date of Birth/Sex: Treating RN: 01-Jul-1976 (46 y.o. 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 03/16/2022 No Yes E11.621 Type 2 diabetes mellitus with foot ulcer 03/16/2022 No Yes E11.42 Type 2 diabetes mellitus with diabetic polyneuropathy 03/16/2022 No Yes L97.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 03/16/2022 No Yes of skin Inactive Problems Resolved Problems Electronic Signature(s) Signed: 02/15/2023 12:31:39 PM By: Geralyn Corwin DO Entered By: Geralyn Corwin on 02/15/2023 10:53:33 [name] ([id number]) [id number] Page 11 of 20 -------------------------------------------------------------------------------- Progress Note Details Patient Name: Date of Service: DONDREA, CLENDENIN 02/15/2023 10:00 [name]: [id number] Patient Account Number: [id number] Date of Birth/Sex: Treating RN: November 04, 1975 (47 y.o. Skip Mayer Primary Care Provider: [name] Other Clinician: [name] Referring Provider: Treating Provider/Extender: Chapman Moss in Treatment: 19 Subjective Chief Complaint Information obtained from Patient 03/19/2021; patient referred by Dr. Excell Seltzer who has been looking after her left foot for quite [name] period of time for review of [name] nonhealing area in the left midfoot 03/12/2022; bilateral feet wounds and right lower extremity wound. History of Present Illness (HPI) 01/18/18-She is here for initial evaluation of the left great toe ulcer. She is [name] poor historian in regards to timeframe in detail. She states approximately 4 weeks ago she lacerated her toe on something in the house. She followed up with  her primary care who placed her on Bactrim and ultimately [name] second dose of Bactrim prior to coming to wound clinic. She states she  has been treating the toe with peroxide, Betadine and [name] Band-Aid. She did not check her blood sugar this morning but checked it yesterday morning it was 327; she is unaware of [name] recent A1c and there are no current records. She saw Dr. she would've orthopedics last week for an old injury to the left ankle, she states he did not see her toe, nor did she bring it to his attention. She smokes approximately 1 pack cigarettes [name] day. Her social situation is concerning, she arrives this morning with her mother who appears extremely intoxicated/under the influence; her mother was asked to leave the room and be monitored by the patient's grandmother. The patient's aunt then accompanied the patient and the room throughout the rest of the appointment. We had [name] lengthy discussion regarding the deleterious effects of uncontrolled hyperglycemia and smoking as it relates to wound healing and overall health. She was strongly encouraged to decrease her smoking and get her diabetes under better control. She states she is currently on [name] diet and has cut down her [facility] consumption. The left toe is erythematous, macerated and slightly edematous with malodor present. The edema in her left foot is below her baseline, there is no erythema streaking. We will treat her with Santyl, doxycycline; we have ordered and xray, culture and provided [name] Peg assist surgical shoe and cultured the wound. 01/25/18-She is here in follow-up evaluation for [name] left great toe ulcer and presents with an abscess to her suprapubic area. She states her blood sugars remain elevated, feeling "sick" and if levels are below 250, but she is trying. She has made no attempt to decrease her smoking stating that we "can't take away her food in her cigarettes". She has been compliant with offloading using the PEG assist you. She  is using Santyl daily. the culture obtained last week grew staph aureus and Enterococcus faecalis; continues on the doxycycline and Augmentin was added on Monday. The suprapubic area has erythema, no femoral variation, purple discoloration, minimal induration, was accessed with [name] cotton tip applicator with sanguinopurulent drainage, this was cultured, I suspect the current antibiotic treatment will cover and we will not add anything to her current treatment plan. She was advised to go to urgent care or ER with any change in redness, induration or fever. 02/01/18-She is here in follow-up evaluation for left great toe ulcers and [name] new abdominal abscess from last week. She was able to use packing until earlier this week, where she "forgot it was there". She states she was feeling ill with GI symptoms last week and was not taking her antibiotic. She states her glucose levels have been predominantly less than 200, with occasional levels between 200-250. She thinks this was contributing to her GI symptoms as they have resolved without intervention. There continues to be significant laceration to left toe, otherwise it clinically looks stable/improved. There is now less superficial opening to the lateral aspect of the great toe that was residual blister. We will transition to [facility] to all wounds, she will continue her Augmentin. If there is no change or deterioration next week for reculture. 02/08/18-She is here in follow-up evaluation for left great toe ulcer and abdominal ulcer. There is an improvement in both wounds. She has been wrapping her left toe with coban, not by our direction, which has created an area of discoloration to the medial aspect; she has been advised to NOT use coban secondary to her neuropathy. She states her glucose levels have been high  over this last week ranging from 200-350, she continues to smoke. She admits to being less compliant with her offloading shoe. We will continue  with same treatment plan and she will follow-up next week. 02/15/18-She is here in follow-up evaluation for left great toe ulcer and abdominal ulcer. The abdominal ulcer is epithelialized. The left great toe ulcer is improved and all injury from last week using the Coban wrap is resolved, the lateral ulcer is healed. She admits to noncompliance with wearing offloading shoe and admits to glucose levels being greater than 300 most of the week. She continues to smoke and expresses no desire to quit. There is one area medially that probes deeper than it has historically, erythema to the toe and dorsal foot has consistently waxed and waned. There is no overt signs of cellulitis or infection but we will culture the wound for any occult infection given the new area of depth and erythema. We will hold off on sensitivities for initiation of antibiotic therapy. 02/22/18-She is here in follow up evaluation for left great toe ulcer. There is overall significant improvement in both wound appearance, erythema and edema with changes made last week. She was not initiated on antibiotic therapy. Culture obtained last week showed oxacillin sensitive staph aureus, sensitive to clindamycin. Clindamycin has been called into the pharmacy but she has been instructed to hold off on initiation secondary to overall clinical improvement and her history of antibiotic intolerance. She has been instructed to contact the clinic with any noted changes/deterioration and the wound, erythema, edema and/or pain. She will follow-up next week. She continues to smoke and her glucose levels remain elevated >250; she admits to compliance with offloading shoe 03/01/18 on evaluation today patient appears to be doing fairly well in regard to her left first toe ulcer. She has been tolerating the dressing changes with the [facility] Dressing without complication and overall this has definitely showed signs of improvement according to records as well  is what the patient tells me today. I'm very pleased in that regard. She is having no pain today 03/08/18 She is here for follow up evaluation of [name] left great toe ulcer. She remains non-compliant with glucose control and smoking cessation; glucose levels consistently >200. She states that she got new shoe inserts/peg assist. She admits to compliance with offloading. Since my last evaluation there is significant improvement. We will switch to prisma at this time and she will follow up next week. She is noted to be tachycardic at this appointment, heart rate 120s; she has [name] history of heart rate 70-130 according to our records. She admits to extreme agitation r/t personal issues; she was advised to monitor her heartrate and contact her physician if it does not return to [name] more normal range (<100). She takes cardizem twice daily. 03/15/18-She is here in follow-up evaluation for left great toe ulcer. She remains noncompliant with glucose control and smoking cessation. She admits to compliance with wearing offloading shoe. The ulcer is improved/stable and we will continue with the same treatment plan and she will follow-up next week 03/22/18-She is here for evaluation for left great toe ulcer. There continues to be significant improvement despite recurrent hyperglycemia (over 500 yesterday) and she continues to smoke. She has been compliant with offloading and we will continue with same treatment plan and she will follow-up next week. 03/29/18-She is here for evaluation for left great toe ulcer. Despite continuing to smoke and uncontrolled diabetes she continues to improve. She is compliant with offloading  shoe. We will continue with the same treatment plan and she will follow-up next week 04/05/18- She is here in follow up evaluation for [name] left great toe ulcer; she presents with small pustule to left fifth toe (resembles ant bite). She admits to compliance with wearing offloading shoe; continues to smoke or have  uncontrolled blood glucose control. There is more callus than usual with evidence of bleeding; she denies known trauma. 04/12/18-She is here for evaluation of left great toe ulcer. Despite noncompliance with glycemic control and smoking she continues to make improvement. She continues to wear offloading shoe. The pustule, that was identified last week, to the left fifth toe is resolved. She will follow-up in 2 weeks 05/03/18-she is seen in follow-up evaluation for [name] left great toe ulcer. She is compliant with offloading, otherwise noncompliant with glycemic control and smoking. She has plateaued and there is minimal improvement noted. We will transition to [facility], replaced the insert to her surgical shoe and she will follow-up in one week 05/10/18- She is here in follow up evaluation for [name] left great toe ulcer. It appears stable despite measurement change. We will continue with same treatment plan and follow up next week. 05/24/18-She is seen in follow-up evaluation for [name] left great toe ulcer. She remains compliant with offloading, has made significant improvement in her diet, decreasing the amount of sugar/soda. She said her recent A1c was 10.9 which is lower than. She did see [name] diabetic nutritionist/educator yesterday. She continues to smoke. We will continue with the same treatment plan and she'll follow-up next week. 05/31/18- She is seen in follow-up evaluation for left great toe ulcer. She continues to remain compliant with offloading, continues to make improvement in her diet, increasing her water and decreasing the amount of sugar/soda. She does continue to smoke with no desire to quit. We will apply Prisma to the depth and KSENIA, KUNZ [name] ([id number]) [id number].pdf Page 12 of 20 Hydrofera Blue over. We have not received insurance authorization for oasis. She will follow up next week. 06/07/18-She is seen in follow-up evaluation for left great toe ulcer. It has  stalled according to today's measurements although base appears stable. She says she saw [name] diabetic educator yesterday; her average blood sugars are less than 300 which is an improvement for her. She continues to smoke and states "that's my next step" She continues with water over soda. We will order for xray, culture and reinstate ace wrap compression prior to placing apligraf for next week. She is voicing no complaints or concerns. Her dressing will change to iodoflex over the next week in preparation for apligraf. 06/14/18-She is seen in follow-up evaluation for left great toe ulcer. Plain film x-ray performed last week was negative for osteomyelitis. Wound culture obtained last week grew strep B and OSSA; she is initiated on keflex and cefdinir today; there is erythema to the toe which could be from ace wrap compression, she has [name] history of wrapping too tight and has has been encouraged to maintain ace wraps that we place today. We will hold off on application of apligraf today, will apply next week after antibiotic therapy has been initiated. She admits today that she has resumed taking [name] shower with her foot/toe submerged in water, she has been reminded to keep foot/toe out of the bath water. She will be seen in follow up next week 06/21/18-she is seen in follow-up evaluation for left great toe ulcer. She is tolerating antibiotic therapy with no GI disturbance. The wound  is stable. Apligraf was applied today. She has been decreasing her smoking, only had 4 cigarettes yesterday and 1 today. She continues being more compliant in diabetic diet. She will follow-up next week for evaluation of site, if stable will remove at 2 weeks. 06/28/18- She is here in follow up evalution. Apligraf was placed last week, she states the dressing fell off on Tuesday and she was dressing with hydrofera blue. She is healed and will be discharged from the clinic today. She has been instructed to continue with smoking  cessation, continue monitoring glucose levels, offloading for an additional 4 weeks and continue with hydrofera blue for additional two weeks for any possible microscopic opening. Readmission: 08/07/18 on evaluation today patient presents for reevaluation concerning the ulcer of her right great toe. She was previously discharged on 06/28/18 healed. Nonetheless she states that this began to show signs of drainage she subsequently went to her primary care provider. Subsequently an x-ray was performed on 08/01/18 which was negative. The patient was also placed on antibiotics at that time. Fortunately they should have been effective for the infection. Nonetheless she's been experiencing some improvement but still has [name] lot of drainage coming from the wound itself. 08/14/18 on evaluation today patient's wound actually does show signs of improvement in regard to the erythema at this point. She has completed the antibiotics. With that being said we did discuss the possibility of placing her in [name] total contact cast as of today although I think that I may want to give this just [name] little bit more time to ensure nothing recurrence as far as her infection is concerned. I do not want to put in the cast and risk infection at that time if things are not completely resolved. With that being said she is gonna require some debridement today. 08/21/18 on evaluation today patient actually appears to be doing okay in regard to her toe ulcer. She's been tolerating the dressing changes without complication. With that being said it does appear that she is ready and in fact I think it's appropriate for [place] to go ahead and initiate the total contact cast today. Nonetheless she will require some sharp debridement to prepare the wound for application. Overall I feel like things have been progressing well but we do need to do something to get this to close more readily. 08/24/18 patient seen today for reevaluation after having had the  total contact cast applied on Tuesday. She seems to have done very well the wound appears to be doing great and overall I'm pleased with the progress that she's made. There were no abnormal areas of rubbing from the cast on her lower extremity. 08/30/18 on evaluation today patient actually appears to be completely healed in regard to her plantar toe ulcer. She tells me at this point she's been having [name] lot of issues with the cast. She almost fell [name] couple of times the state shall the step of her dog [name] couple times as well. This is been [name] very frustrating process for her other nonetheless she has completely healed the wound which is excellent news. Overall there does not appear to be the evidence of infection at this time which is great news. 09/11/18 evaluation today patient presents for follow-up concerning her great toe ulcer on the left which has unfortunately reopened since I last saw her which was only [name] couple of weeks ago. Unfortunately she was not able to get in to get the shoe and potentially the AFO that's gonna  be necessary due to her left foot drop. She continues with offloading shoe but this is not enough to prevent her from reopening it appears. When we last had her in the total contact cast she did well from [name] healing standpoint but unfortunately the wound reopened as soon as she came out of the cast within just [name] couple of weeks. 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 This is also part of the reason why she falls on [name] regular basis. I just do not believe that is gonna be safe for [place] to reinitiate the total contact cast as last time we had this on she fell 3 times one week which is definitely not normal for her. 09/18/18 upon evaluation today the patient actually appears to be doing about  the same in regard to her toe ulcer. She did not contact [employer] as I asked her to even though I had given her the prescription. In fact she actually states that she has no idea where the prescription is. She did apparently call [employer] and they told her that all she needed to do was bring the prescription in order to be able to be seen and work on getting the AFO for her left foot. With all that being said she still does not have an appointment and I'm not sure were things stand that regard. I will give her [name] new prescription today in order to contact them to get this set up. 09/25/18 on evaluation today patient actually appears to be doing about the same in regard to her toes ulcer. She does have [name] small areas which seems to have [name] lot of callous buildup around the edge of the wound which is going to need sharp debridement today. She still is waiting to be scheduled for evaluation with [employer] for possibility of an AFO. She states there supposed to call her tomorrow to get this set up. Unfortunately it does appear that her foot specifically the toe area is showing signs of erythema. There does not appear to be any systemic infection which is in these good news. 10/02/18 on evaluation today patient actually appears to be doing about the same in regard to her toe ulcer. This really has not done too well although it's not significantly larger it's also not significantly smaller. She has been tolerating the dressing changes without complication. She actually has her appointment with [employer] and [employer] tomorrow to hopefully be measured for obtaining and AFO splint. I think this would be helpful preventing this from reoccurring. We had contemplated starting the cast this week although to be honest I am reluctant to do that as she's been having nausea, vomiting, and seizure activity over the past three days. She has [name] history of seizures and have been told is nothing that can be done for these. With that  being said I do believe that along with the seizures have the nausea vomiting which upon further questioning doesn't seem to be the normal for her and makes me concerned for the possibility of infection or something else going on. I discussed this with the patient and her mother during the office visit today. I do not feel the wound is effective but maybe something else. The responses this was "this just happens to her at  times and we don't know why". They did not seem to be interested in going to the hospital to have this checked out further. 10/09/18 on evaluation today patient presents for follow-up concerning her ongoing toe ulcer. She has been tolerating the dressing changes without complication. Fortunately there does not appear to be any evidence of infection which is great news however I do think that the patient would benefit from going ahead for with the total contact cast. She's actually in [name] wheelchair today she tells me that she will use her walker if we initiate the cast. I was very specific about the fact that if we were gonna do the cast I wanted to make sure that she was using the walker in order to prevent any falls. She tells me she does not have stairs that she has to traverse on [name] regular basis at her home. She has not had any seizures since last week again that something that happens to her often she tells me she did talk to [employer] and they said that it may take up to three weeks to get the brace approved for her. Hopefully that will not take that long but nonetheless in the meantime I do think the cast could be of benefit. 10/12/18 on evaluation today patient appears to be doing rather well in regard to her toe ulcer. It's just been [name] few days and already this is significantly improved both as far as overall appearance and size. Fortunately there's no sign of infection. She is here for her first obligatory cast change. 10/19/18 Seen today for follow up and management of left  great toe ulcer. Wound continues to show improvement. Noted small open area with seroussang drainage with palpation. Denies any increased pain or recent fevers during visit. She will continue calcium alginate with offloading shoe. Denies any questions or concerns during visit. 10/26/18 on evaluation today patient appears to be doing about the same as when I last saw her in regard to her wound bed. Fortunately there does not appear to be any signs of infection. Unfortunately she continues to have [name] breakdown in regard to the toe region any time that she is not in the cast. It takes almost no time at all for this to happen. Nonetheless she still has not heard anything from the brace being made by [employer] as to when exactly this will be available to her. Fortunately there is no signs of infection at this time. EMMALISE, HUARD [name] ([id number]) [id number] Page 13 of 20 10/30/18 on evaluation today patient presents for application of the total contact cast as we just received him this morning. Fortunately we are gonna be able to apply this to her today which is great news. She continues to have no significant pain which is good news. Overall I do feel like things have been improving while she was the cast is when she doesn't have [name] cast that things get worse. She still has not really heard anything from [employer] regarding her brace. 11/02/18 upon evaluation today patient's wound already appears to be doing significantly better which is good news. Fortunately there does not appear to be any signs of infection also good news. Overall I do think the total contact cast as before is helping to heal this area unfortunately it's just not gonna likely keep the area closed and healed without her getting her brace at least. Again the foot drop is [name] significant issue for her. 11/09/18 on evaluation today patient appears to be doing excellent  in regard to her toe ulcer which in fact is completely  healed. Fortunately we finally got the situation squared away with the paperwork which was needed to proceed with getting her brace approved by Medicaid. I have filled that out unfortunately that information has been sent to the orthopedic office that I worked at 2 1/2 years ago and not tired Current wound care measures. Fortunately she seems to be doing very well at this time. 11/23/18 on evaluation today patient appears to be doing More Poorly Compared to Last Time I Saw Her. At [facility] She Had Completely Healed. Currently she is continuing to have issues with reopening. She states that she just found out that the brace was approved through Medicaid now she just has to go get measured in order to have this fitted for her and then made. Subsequently she does not have an appointment for this yet that is going to complicate things we obviously cannot put her back in the cast if we do not have everything measured because they're not gonna be able to measure her foot while she is in the cast. 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 Obviously this is unfortunate and does have me somewhat worried as well. 11/30/18 on evaluation today patient's toe ulcer actually appears to be doing fairly well. The good news is she will be getting her brace in the shoes next week on Wednesday. Hopefully we will be able to get this to heal without having to go back in the cast however she may need the cast in order to get the wound completely heal and then go from there. Fortunately there's no signs of infection at this time. 12/07/18 on evaluation today patient fortunately did receive her brace and she states she could tell this definitely makes her walk better. With that being said she's been having issues with her toe where she noticed yesterday there was [name] lot of tissue that was loosing off this appears to be much larger  than what it was previous. She also states that her leg has been read putting much across the top of her foot just about the ankle although this seems to be receiving somewhat. The total area is still red and appears to be someone infected as best I can tell. She is previously taken Bactrim and that may be [name] good option for her today as well. We are gonna see what I wound culture shows as well and I think that this is definitely appropriate. With that being said outside of the culture I still need to initiate something in the interim and that's what I'm gonna go ahead and select Bactrim is [name] good option for her. 12/14/18 on evaluation today patient appears to be doing better in regard to her left great toe ulcer as compared to last week's evaluation. There's still some erythema although this is significantly improved which is excellent news. Overall I do believe that she is making good progress is still gonna take some time before she is where I would like her to be from the standpoint of being able to place her back into the total contact cast. Hopefully we will be where we need to be by next week. 12/21/18 on evaluation today patient actually appears to be doing poorly in regard to her toe ulcer. She's been tolerating the dressing changes without complication. Fortunately there's no signs  of systemic infection although she does have [name] lot of drainage from the toe ulcer and this does seem to be causing some issues at this point. She does have erythema on the distal portion of her toe that appears to be likely cellulitis. 12/28/18 on evaluation today patient actually appears to be doing [name] little better in my pinion in regard to her toe ulcer. With that being said she still does have some evidence of infection at this time and for her culture she had both E. coli as well as enterococcus as organisms noted on evaluation. For that reason I think that though the Keflex likely has treated the E. coli rather  well this has really done nothing for the enterococcus. We are going to have to initiate treatment for this specifically. 01/04/19 on evaluation today patient's toe actually appears to be doing better from the standpoint of infection. She currently would like to see about putting the cash back on I think that this is appropriate as long as she takes care of it and keeps it from getting wet. She is gonna have some drainage we can definitely pass this up with Drawtex and alginate to try to prevent as much drainage as possible from causing the problems. With that being said I do want to at least try her with the cast between now and Tuesday. If there any issues we can't continue to use it then I will discontinue the use of the cast at that point. 01/08/19 on evaluation today patient actually appears to be doing very well as far as her foot ulcer specifically the great toe on the left is concerned. She did have an area of rubbing on the medial aspect of her left ankle which again is from the cast. Fortunately there's no signs of infection at this point in this appears to be [name] very slight skin breakdown. The patient tells me she felt it rubbing but didn't think it was that bad. Fortunately there is no signs of active infection at this time which is good news. No fevers, chills, nausea, or vomiting noted at this time. 01/15/19 on evaluation today patient actually appears to be doing well in regard to her toe ulcer. Again as previous she seems to do well and she has the cast on which indicates to me that during the time she doesn't have [name] cast on she's putting way too much pressure on this region. Obviously I think that's gonna be an issue as with the current national emergency concerning the Covid-19 Virus it has been recommended that we discontinue the use of total contact casting by the chief medical officer of our company, Dr. Maurine Minister. The reasoning is that if [name] patient becomes sick and cannot come into have  the cast removed they could not just leave this on for an additional two weeks. Obviously the hospitals also do not want to receive patient's who are sick into the emergency department to potentially contaminate the region and spread the Covid-19 Virus among other sick individuals within the hospital system. Therefore at this point we are suspending the use of total contact cast until the current emergency subsides. This was all discussed with the patient today as well. 01/22/19 on evaluation today patient's wound on her left great toe appears to be doing slightly worse than previously noted last week. She tells me that she has been on this quite [name] bit in fact she tells me she's been awake for 38 straight hours. This is due to  the fact that she's having to care for grandparents because nobody else will. She has been taking care of them for five the last seven days since I've seen her they both have dementia his is from [name] stroke and her grandmother's was progressive. Nonetheless she states even her mom who knows her condition and situation has only help two of those days to take care of them she's been taking care of the rest. Fortunately there does not appear to be any signs of active infection in regard to her toe at this point although obviously it doesn't look as good as it did previous. I think this is directly related to her not taking off the pressure and friction by way of taking things easy. Though I completely understand what's going on. 01/29/19 on evaluation today patient's tools are actually appears to be showing some signs of improvement today compared to last week's evaluation as far as not necessarily the overall size of the wound but the fact that she has some new skin growth in between the two ends of the wound opening. Overall I feel like she has done well she states that she had [name] family member give her what sounds to be [name] CAM walker boot which has been helpful as well. 02/05/19 on  evaluation today patient's wound bed actually appears to be doing significantly better in regard to her overall appearance of the size of the wound. With that being said she is still having an issue with offloading efficiently enough to get this to close. Apparently there is some signs of infection at this point as well unfortunately. Previously she's done well of Augmentin I really do not see anything that needs to be culture currently but there theme and cellulitis of the foot that I'm seeing I'm gonna go ahead and place her on an antibiotic today to try to help clear this up. 02/12/2019 on evaluation today patient actually appears to be doing poorly in regard to her overall wound status. She tells me she has been using her offloading shoe but actually comes in today wearing her tennis shoe with the AFO brace. Again as I previously discussed with her this is really not sufficient to allow the area to heal appropriately. Nonetheless she continues to be somewhat noncompliant and I do wonder based on what she has told my nurse in the past as to whether or not [name] good portion of this noncompliance may be recreational drug and alcohol related. She has had [name] history of heroin overdose and this was fairly recently in the past couple of months that have been seeing her. Nonetheless overall I feel like her wound looks significantly worse today compared to what it was previous. She still has significant erythema despite the Augmentin I am not sure that this is an appropriate medication for her infection I am also concerned that the infection may have gone down into her bone. 02/19/19 on evaluation today patient actually appears to be doing about the same in regard to her toe ulcer. Unfortunately she continues to show signs of bone exposure and infection at this point. There does not appear to be any evidence of worsening of the infection but I'm also not really sure that it's getting significantly better. She is  on the Augmentin which should be sufficient for the Staphylococcus aureus infection that she has at this point. With that being said she may need IV antibiotics to more appropriately treat this. We did have [name] discussion today about hyperbaric  option therapy. [name], [name] ([id number]) [id number] Page 14 of 20 02/28/19 on evaluation today patient actually appears to be doing much worse in regard to the wound on her left great toe as compared to even my previous evaluation last week. Unfortunately this seems to be training in [name] pretty poor direction. Her toe was actually now starting to angle laterally and I can actually see the entire joint area of the proximal portion of the digit where is the distal portion of the digit again is no longer even in contact with the joint line. Unfortunately there's [name] lot more necrotic tissue around the edge and the toe appears to be showing signs of becoming gangrenous in my pinion. I'm very concerned about were things stand at this point. She did see infectious disease and they are planning to send in [name] prescription for Sivextro for her and apparently this has been approved. With that being said I don't think she should avoid taking this but at the same time I'm not sure that it's gonna be sufficient to save her toe at this point. She tells me that she still having to care for grandparents which I think is putting quite [name] bit of strain on her foot and specifically the total area and has caused this to break down even to [name] greater degree than would've otherwise been expected. 03/05/19 on evaluation today patient actually appears to be doing quite well in regard to her toe all things considering. She still has bone exposed but there appears to be much less your thing on overall the appearance of the wound and the toe itself is dramatically improved. She still does have some issues currently obviously with infection she did see vascular as well  and there concerned that her blood flow to the toad. For that reason they are setting up for an angiogram next week. 03/14/19 on evaluation today patient appears to be doing very poor in regard to her toe and specifically in regard to the ulceration and the fact that she's starting to notice the toe was leaning even more towards the lateral aspect and the complete joint is visible on the proximal aspect of the joint. Nonetheless she's also noted [name] significant odor and the tip of the toe is turning more dark and necrotic appearing. Overall I think she is getting worse not better as far as this is concerned. For that reason I am recommending at this point that she likely needs to be seen for likely amputation. READMISSION 03/19/2021 This is [name] patient that we cared for in this clinic for [name] prolonged period of time in 2019 and 2020 with [name] left foot and left first toe wound. 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 Excell Seltzer. In December 2021 she had an ulcer on her right great toe as well as the fourth and fifth toes. 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 She also had an angiogram at that time and underwent angioplasty of the right anterior tibial artery. In any case she claims that the wound on the right foot is closed I did not look at this today which was probably an oversight although I think that should be done next week. 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 According to Dr. Bernette Redbird last review that she was out  of the area being cared for by another physician but recently came back to his attention. 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 [name] culture of this area showed E. coli apparently before she came back to see Dr. Excell Seltzer she was supposed to be receiving antibiotics but she did not  really take them. Nor is she offloading this area at all. Finally her last hemoglobin A1c listed in epic was in March 2022 at 14.1 she says things are [name] lot better since then although I am not sure. 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 She had this wound at the time. During this admission she had E. coli sepsis [name] TEE was negative. 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 She is still [name] heavy cigarette smoker. She has [name] history of bipolar disorder seizure disorder ABI in our clinic was 1.05 6/1; left midfoot in the setting of [name] TMA done previously. Round circular wound with [name] "knuckle" of protruding tissue. The problem is that the knuckle was not attached to any of the surrounding granulation and this probed proximally widely I removed [name] large portion of this tissue. This wound goes with considerable undermining laterally. I do not feel any bone there was no purulence but this is [name] deep wound. 6/8; in spite of the debridement I did last week. She arrives with [name] wound looking exactly the same. [name] protruding "knuckle" of tissue nonadherent to most of the surrounding tissue. There is considerable depth around this from 6-12 o'clock at 2.7 cm and undermining of 1 cm. This does not look overtly infected and the x- ray I did last week was negative for any osseous abnormalities. We have been using silver collagen 6/15; deep tissue culture I did last week showed moderate staph aureus and moderate Pseudomonas. This will definitely require prolonged antibiotic therapy. The pathology on the protuberant area was negative for malignancy fungus etc. 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 We have been using silver collagen. I am going to be prescribing Levaquin for 2 weeks. Her CT scan of the foot is down for 7/5 6/22; CT scan of the foot on 7 5. She says she has hardware in the left leg from her previous fracture. She is on the Levaquin for the deep tissue culture I did that showed methicillin sensitive staph aureus and Pseudomonas. I gave her [name] 2-week supply and she will have another week. She arrives in clinic today with the same protuberant tissue however this is nonadherent to the tissue surrounding it. I am really at [name] loss to explain this unless there is underlying deep tissue infection 6/29; patient presents for 1 week follow-up. She has been using collagen to the wound bed. She reports taking her antibiotics as prescribed.She has no complaints or issues today. She denies signs of infection. 7/6; patient presents for one week followup. She has been using collagen to the wound bed. She states she is taking Levaquin however at times she is not able to keep it down. She denies signs of infection. 7/13; patient presents for 1 week follow-up. She has been using silver alginate to the wound bed. She still has nausea when taking  her antibiotics. She denies signs of infection. 7/20; patient presents for 1 week follow-up. She has been using silver alginate with gentamicin cream to the wound bed. She denies any issues and has no complaints today. She denies signs of infection. 7/27; patient presents for 1 week follow-up. She continues to use silver alginate with gentamicin cream to the wound bed. She reports starting her antibiotics. She has no issues or complaints. Overall she reports stability to the wound. 8/3; patient presents for 1 week follow-up. She has been using silver alginate with gentamicin cream to the wound bed. She reports completing all antibiotics. She has no issues or complaints  today. She denies signs of infection. 8/17; patient presents for 2-week follow-up. He is to use silver alginate to the wound bed. She has no issues or complaints today. She denies signs of infection. She reports her pain has improved in her foot since last clinic visit 8/24; patient presents for 1 week follow-up. She continues to use silver alginate to the wound bed. She has no issues or complaints. She denies signs of infection. Pain is stable. 9/7; patient presents for follow-up. She missed her last week appointment due to feeling ill. She continues to use silver alginate. She has [name] new wound to the right lower extremity that is covered in eschar. She states It occurred over the past week and has no idea how it started. She currently denies signs of infection. 9/14; patient presents for follow-up. T the left foot wound she has been using gentamicin cream and silver alginate. T the right lower extremity wound she has o o been keeping this covered and has not obtain Santyl. 9/21; patient presents for follow-up. She reports using gentamicin cream and silver alginate to the left foot and Santyl to the right lower extremity wound. She has no issues or complaints today. She denies signs of infection. 9/28; patient presents for follow-up. She reports [name] new wound to her right heel. She states this occurred [name] few days ago and is progressively gotten worse. She has been trying to clean the area with [name] Q-tip and Santyl. She reports stability in the other 2 wounds. She has been using gentamicin cream and silver alginate to the left foot and Santyl to the right lower extremity wound. 10/12; patient presents for follow-up. She reports improvement to the wound beds. She is seeing vein and vascular to discuss the potential of [name] left BKA. She states they are going to do an arteriogram. She continues to use silver alginate with dressing changes to her wounds. 11/2; patient presents for follow-up. She states she  has not been doing dressing changes to the wound beds. She states she is not able to offload the areas. She reports chronic pain to her left foot wound. 11/9; patient presents for follow-up. She came in with only socks on. She states she forgot to put on shoes. It is unclear if she is doing any dressing changes. She currently denies systemic signs of infection. SHATARA, STANEK [name] ([id number]) [id number] Page 15 of 20 11/16; patient presents for follow-up. She came again only with socks on. She states she does not wear shoes ever. It is unclear if she does dressing changes. She currently denies systemic signs of infection. 11/23; patient presents for follow-up. She wore her shoes today. It still unclear exactly what dressing she is using for each wound but she did states she obtained Dakin's solution and has been using this to the left foot wound. She currently denies signs  of infection. 11/30; patient presents for follow-up. She has no issues or complaints today. She currently denies signs of infection. 12/7; patient presents for follow-up. She has no issues or complaints today. She has been using Hydrofera Blue to the right heel wound and Dakin solution to the left foot wound. Her right anterior leg wound is healed. She currently denies signs of infection. 12/14; patient presents for follow-up. She has been using Hydrofera Blue to the right heel and Dakin's to the left foot wounds. She has no issues or complaints today. She denies signs of infection. 12/21; patient presents for follow-up. She reports using Hydrofera Blue to the right heel and Dakin's to the left foot wound. She denies signs of infection. 12/28; patient presents for follow-up. She continues to use Dakin's to the left foot wound and Hydrofera Blue to the right heel wound. She denies signs of infection. 1/4; patient presents for follow-up. She has no issues or complaints today. She denies signs of  infection. 1/11; patient presents for follow-up. It is unclear if she has been dressing these wounds over the past week. She currently denies signs of infection. 1/18; patient presents for follow-up. She states she has been using Dakin's wet-to-dry dressings to the left foot. She has been using Hydrofera Blue to the right foot foot wound. She states that the anterior right leg wound has reopened and draining serous fluid. She denies signs of infection. 1/25; patient presents for follow-up. She has no issues or complaints today. 2/1; patient presents for follow-up. She has no issues or complaints today. She denies signs of infection. 2/8; patient presents for follow-up. She has lost her surgical shoes. She did not have [name] dressing to the right heel wound. She currently denies signs of infection. 2/15; patient presents for follow-up. She reports more pain to the right heel today. She denies purulent drainage Or fever/chills 2/22; patient presents for follow-up. She reports taking clindamycin over the past week. She states that she continues to have pain to her right heel. She reports purulent drainage. Readmission 03/16/2022 Ms. Quinisha Mould is [name] 47 year old female with [name] past medical history of type 2 diabetes, osteomyelitis to her feet, chronic systolic heart failure and bipolar disorder that presents to the clinic for bilateral feet wounds and right lower extremity wound. She was last seen in our clinic on 12/15/2021. At that time she had purulent drainage coming out of her right plantar foot and I recommended she go to the ED. She states she went to [facility] and has been there for the past 3 months. I cannot see the records. She states she had OR debridement and was on several weeks of IV antibiotics while inpatient. Since discharge she has not been taking care of the wound beds. She had nothing on her feet other than socks today. She currently denies signs of infection. 5/31; patient  presents for follow-up. She has been using Dakin's wet-to-dry dressings to the wound beds on her feet bilaterally and antibiotic ointment to the right anterior leg wound. She had [name] wound culture done at last clinic visit that showed moderate Pseudomonas aeruginosa sensitive to ciprofloxacin. She currently denies systemic signs of infection. 6/14; patient presents for follow-up. She received Keystone 5 days ago and has been using this on the wound beds. She states that last week she had to go to the hospital because she had increased warmth and erythema to the right foot. She was started on 2 oral antibiotics. She states she has been  taking these. She currently denies systemic signs of infection. She has no issues or complaints today. 6/21; patient presents for follow-up. She states she has been using Keystone antibiotics to the wound beds. She has no issues or complaints today. She denies signs of infection. 6/28; patient presents for follow-up. She has been using Keystone antibiotics to the wound beds. She has no issues or complaints today. 7/12; patient presents for follow-up. Has been using Keystone antibiotics to the wound beds with calcium alginate. She has no issues or complaints today. She never followed up with her orthopedic surgeon who did the OR debridement to the right foot. We discussed the total contact cast for the left foot and patient would like to do this next week. 7/19; patient presents for follow-up. She has been using Keystone antibiotics with calcium alginate to the wound beds. She has no issues or complaints today. Patient is in agreement to do the total contact cast of the left foot today. She knows to return later this week for the obligatory cast change. 05-13-2022 upon evaluation today patient's wound which she has the cast of the left leg actually appears to be doing significantly better. Fortunately I do not see any signs of active infection locally or systemically which  is great news and overall I am extremely pleased with where we stand currently. 7/26; patient presents for follow-up. She has [name] cast in place for the past week. She states it irritated her shin. Other than that she tolerated the cast well. She states she would like [name] break for 1 week from the cast. We have been using Keystone antibiotic and Aquacel to both wound beds. She denies signs of infection. 8/2; patient presents for follow-up. She has been using Keystone and Aquacel to the wound beds. She denies any issues and has no complaints. She is agreeable to have the cast placed today for the left leg. 06-03-2022 upon evaluation today patient appears to be doing well with regard to her wound she saw some signs of improvement which is great news. Fortunately I do not see any evidence of active infection locally or systemically at this time which is great news. No fevers, chills, nausea, vomiting, or diarrhea. 8/16; patient presents for follow-up. She has no issues or complaints today. We have been using Keystone and Aquacel to the wound beds. The left lower extremity is in [name] total contact cast. She is tolerated this well. 8/23; patient presents for follow-up. She has had the total contact cast on the left leg for the past week. Unfortunately this has rubbed and broken down the skin to the medial foot. She currently denies signs of infection. She has been using Keystone antibiotic to the right plantar foot wound. 8/30; patient presents for follow-up. We have held off on the total contact cast for the left leg for the past week. Her wound on the left foot has improved and the previous surrounding breakdown of skin has epithelialized. She has been using Keystone antibiotic to both wound beds. She has no issues or complaints today. She denies signs of infection. 9/6; patient presents for follow-up. She has ordered her's Keystone antibiotic and this is arriving this week. She has been doing Dakin's wet-to-dry  dressings to the wound beds. She denies signs of infection. She is agreeable to the total contact cast today. VALYNCIA, WIENS [name] ([id number]) [id number] Page 16 of 20 9/13; patient presents for follow-up. She states that the cast caused her left leg shin to be sore. She would  like to take [name] break from the cast this week. She has been using Keystone antibiotic to the right plantar foot wound. She denies signs of infection. 9/20; patient presents for follow-up. She has been using Keystone antibiotics to the wound beds with calcium alginate to the right foot wound and Hydrofera Blue to the left foot wound. She is agreeable to having the cast placed today. She has been approved for Apligraf and we will order this for next clinic visit. 9/27; patient presents for follow-up. We have been using Keystone antibiotic with Hydrofera Blue to the left foot wound under [name] total contact cast. T the right o foot wound she has been using Keystone antibiotic and calcium alginate. She declines [name] total contact cast today. Apligraf is available for placement and she would like to proceed with this. 07-28-2022 upon evaluation today patient appears to be doing well currently in regard to her wound. She is actually showing signs of significant improvement which is great news. Fortunately I do not see any evidence of active infection locally nor systemically at this time. She has been seeing Dr. Mikey Bussing and to be honest has been doing very well with the cast. Subsequently she comes in today with [name] cast on and we did reapply that today as well. She did not really want to she try to talk me out of that but I explained that if she wanted to heal this is really the right way to go. Patient voiced understanding. In regard to her right foot this is actually [name] lot better compared to the last time I saw her which is also great news. 10/11; patient presents for follow-up. Apligraf and the total contact cast  was placed to the left leg at last clinic visit. She states that her right foot wound had burning pain to it with the placement of Apligraf to this area. She has been doing Ferney over this area. She denies signs of infection including increased warmth, erythema or purulent drainage. 11/1; 3-week follow-up. The patient fortunately did not have [name] total contact cast or an Apligraf and on the left foot. She has been using Keystone ABD pads and kerlix and her own running shoes She arrives in clinic today with thick callus and [name] very poor surface on the left foot on the right nonviable skin subcutaneous tissue and [name] deep probing hole. 11/15; patient missed her last clinic appointment. She states she has not been dressing the wound beds for the past 2 weeks. She states that at she had [name] new roommate but is now going back to live with her mother. Apparently its been [name] distracting 2 weeks. Patient currently denies signs of infection. 11/22; patient presents for follow-up. She states she has been using Keystone antibiotic and Dakin's wet-to-dry dressings to the wound beds. She is agreeable for cast placement today. We had ordered Apligraf however this has not been received by our facility. 11/29; Patient had [name] total contact cast placed at last clinic visit and she tolerated this well. We were using silver alginate under the cast. Patient's been using Keystone antibiotic with Aquacel to the right plantar foot wound. She has no issues or complaints today. Apligraf is available for placement today. Patient would like to proceed with this. 12/6; patient presents for follow-up. She had Apligraf placed in standard fashion last clinic visit under the total contact cast to the left lower extremity. She has been using Keystone antibiotic and Aquacel to the right plantar foot wound. She has no  issues or complaints today. 12/13; patient presents for follow-up. She has finished 5 Apligraf placements. Was told she would  not qualify for more. We have been doing [name] total contact cast to the left lower extremity. She has been using Keystone antibiotic and Aquacel to the right plantar foot wound. She has no issues or complaints today. 12/20; patient presents for follow-up. We have been using Hydrofera Blue with Keystone antibiotic under [name] total contact cast of the left lower extremity. She reports using Keystone antibiotic and silver alginate to the right heel wound. She has no issues or complaints today. 12/27; patient presents with [name] healthy wound on the left midfoot. We have Apligraf to apply that to that more also using [name] total contact cast. On the right we are using Keystone and silver alginate. She is offloading the right heel with [name] surgical shoealthough by her admission she is on her feet quite [name] bit 1/3; patient presents for follow-up. Apligraf was placed to the wound beds last clinic visit. She was placed in [name] total contact cast to the left lower extremity. She declines [name] total contact cast today. She states that her mother is in the hospital and she cannot adequately get around with the cast on. 1/10; patient presents for follow-up. She declined the total contact cast at last clinic visit. Both wounds have declined in appearance. She states that she has been on her feet and not offloading the wound beds. She currently denies signs of infection. 1/17; patient presents for follow-up. She had the total contact cast along with Apligraf placed last week to the left lower extremity. She tolerated this well. She has been using Aquacel Ag and Keystone antibiotic to the right heel wound. She currently denies signs of infection. She has no issues or complaints today. 1/24; patient presents for follow-up. We have been using Apligraf to the left foot wound along with [name] total contact cast. She has done well with this. T the right o heel wound she has been using Aquacel Ag and Keystone antibiotic ointment. She has no issues  or complaints today. She denies signs of infection. 1/31; patient presents for follow-up. We have been using Apligraf to the left foot wound along with the total contact cast. She continues to do well with this. To the right heel we have been using Aquacel Ag and Keystone antibiotic ointment. She has no issues or complaints today. 12-01-2022 upon evaluation patient is seen today on my schedule due to the fact that she unfortunately was in the hospital yesterday. Her cast needed to come off the only reason she is out of the hospital is due to the fact that they would not take it off at the hospital which is somewhat bewildered reading to me to be perfectly honest. I am not certain why this was but either way she was released and then was placed on my schedule today in order to get this off and reapply the total contact casting as appropriate. I do not have an Apligraf for her today it was applied last week and today's actually expired yesterday as there was some scheduling conflicts with her being in the hospital. Nonetheless we do not have that for reapplication today but the good news that she is not draining too much and the Apligraf can go for up to 2 weeks so I am going to go ahead and reapply the total contact casting but we are going to leave the Apligraf in place. 2/14; patient presents for follow-up.  T the left leg she has had the total contact cast and Apligraf for the past week. She has had no issues with this. T the o o right heel she has been using antibiotic ointment and Aquacel Ag. 2/21; patient presents for follow-up. We have been using Apligraf and [name] total contact cast to the left lower extremity. She is tolerated this well. Unfortunately she is not approved for any more Apligraf per insurance. She has been using antibiotic ointment and Aquacel Ag to the right foot. She has no issues or complaints today. 2/28; patient presents for follow-up. We have been using Hydrofera Blue and  antibiotic ointment under the total contact cast to the left lower extremity. He has been using Aquacel Ag with antibiotic ointment to the right plantar foot. She has no issues or complaints today. 3/6; patient presents for follow-up. She did not obtain her gentamicin ointment. She has been using Aquacel Ag to the right plantar foot wound. We have been using Hydrofera Blue with antibiotic ointment under the total contact cast to the left lower extremity. She has no issues or complaints today. 3/12; patient presents for follow-up. She has been using gentamicin ointment and Aquacel Ag to the right plantar foot wound. We have been using Hydrofera Blue with antibiotic ointment under the total contact cast on the left lower extremity foot wound. She has no issues or complaints today. 3/20; patient presents for follow-up. She has been using gentamicin ointment and Aquacel Ag to the right plantar foot wound. We have been using Hydrofera Blue with antibiotic ointment under the total contact cast to the left lower extremity wound. She has no issues or complaints today. 3/27; patient presents for follow-up. We have been using antibiotic ointment and Aquacel Ag to the right plantar foot wound. We have been using Hydrofera Blue with antibiotic ointment under the total contact cast to the left lower extremity. Both wounds are smaller. 01-24-2023 upon evaluation today patient actually appears to be doing better compared to last time I saw her. It has been several weeks since I last saw her but FREADA, TWERSKY [name] ([id number]) [id number] Page 17 of 20 nonetheless I think the total contact casting is doing [name] good job. Fortunately I do not see any evidence of infection or worsening overall which is great news and in general I do believe that she is moving in the appropriate direction. It has been [name] very long road but nonetheless I feel like she is nearing the end at least in regard to the left  foot and even the right foot looks much better to be perfectly honest. 01-31-2023 upon evaluation today patient appears to be doing well currently in regard to her wounds. She has been require some sharp debridement which I think is probably bone both sides to remove [name] lot of callus that has built up at this point. I discussed that with her today. Also think total contact cast is doing well for the left foot Emina continue that as well. 4/17; patient presents for follow-up. We have been doing Hydrofera Blue with antibiotic ointment under the total contact cast to the left lower extremity and Aquacel Ag with antibiotic ointment to the right foot wound. She has been using her offloading heel shoe here. 4/24; patient presents for follow-up. We have been doing Hydrofera Blue with antibiotic ointment under the total contact cast to the left lower extremity and Aquacel Ag with antibiotic ointment to the right foot wound. She has been using her offloading heel  shoe here. Wounds are smaller with slough accumulation. Objective Constitutional Vitals Time Taken: 10:30 AM, Height: 69 in, Weight: 178 lbs, BMI: 26.3, Temperature: 98.6 [name], Pulse: 61 bpm, Respiratory Rate: 16 breaths/min, Blood Pressure: 131/86 mmHg. General Notes: Right foot: T the plantar heel there is an incision site with increased depth, No probing to bone. T the opening there is Nonviable tissue and o o granulation tissue. Left foot: T the medial aspect there is an open wound with granulation tissue, Callus and slough build up. No overt signs of infection to any o of the wound beds. Integumentary (Hair, Skin) Wound #11 status is Open. Original cause of wound was Surgical Injury. The date acquired was: 12/01/2021. The wound has been in treatment 48 weeks. The wound is located on the Right,Plantar Foot. The wound measures 0.3cm length x 0.3cm width x 0.4cm depth; 0.071cm^2 area and 0.028cm^3 volume. There is Fat Layer (Subcutaneous Tissue)  exposed. There is [name] small amount of serous drainage noted. The wound margin is distinct with the outline attached to the wound base. There is no granulation within the wound bed. There is [name] medium (34-66%) amount of necrotic tissue within the wound bed including Adherent Slough. Wound #12 status is Open. Original cause of wound was Pressure Injury. The date acquired was: 03/16/2020. The wound has been in treatment 48 weeks. The wound is located on the Left,Medial,Plantar Foot. The wound measures 0.4cm length x 0.5cm width x 0.2cm depth; 0.157cm^2 area and 0.031cm^3 volume. There is Fat Layer (Subcutaneous Tissue) exposed. There is [name] small amount of serous drainage noted. The wound margin is flat and intact. There is large (67- 100%) red, pink granulation within the wound bed. There is [name] small (1-33%) amount of necrotic tissue within the wound bed including Adherent Slough. 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 I debrided nonviable tissue. I recommended continuing the course with Hydrofera Blue and antibiotic ointment under the total contact cast on the left and Aquacel Ag with antibiotic ointment on the right. Continue offloading heel shoe on the right. Follow-up in 1 week. Procedures Wound #11 Pre-procedure diagnosis of Wound #11 is an Open Surgical Wound located on the Right,Plantar Foot . There was [name] Excisional Skin/Subcutaneous Tissue Debridement with [name] total area of 0.07 sq cm performed by Geralyn Corwin, MD. With the following instrument(s): Curette to remove Viable and Non-Viable tissue/material. Material removed  includes Subcutaneous Tissue and Slough and. [name] time out was conducted at 10:50, prior to the start of the procedure. [name] Minimum amount of bleeding was controlled with Pressure. The procedure was tolerated well. Post Debridement Measurements: 0.3cm length x 0.3cm width x 0.4cm depth; 0.028cm^3 volume. Character of Wound/Ulcer Post Debridement is stable. Post procedure Diagnosis Wound #11: Same as Pre-Procedure Wound #12 Pre-procedure diagnosis of Wound #12 is [name] Diabetic Wound/Ulcer of the Lower Extremity located on the Left,Medial,Plantar Foot .Severity of Tissue Pre Debridement is: Fat layer exposed. There was [name] Selective/Open Wound Non-Viable Tissue Debridement with [name] total area of 0.16 sq cm performed by Geralyn Corwin, MD. With the following instrument(s): Curette to remove Viable and Non-Viable tissue/material. Material removed includes Callus and Slough and. [name] time out was conducted  at 10:46, prior to the start of the procedure. [name] Minimum amount of bleeding was controlled with Pressure. The procedure was tolerated well. Post Debridement Measurements: 0.4cm length x 0.5cm width x 0.2cm depth; 0.031cm^3 volume. Character of Wound/Ulcer Post Debridement is stable. Severity of Tissue Post Debridement is: Fat layer exposed. Post procedure Diagnosis Wound #12: Same as Pre-Procedure TIWANA, CHAVIS [name] ([id number]) [phone].pdf Page 18 of 20 Pre-procedure diagnosis of Wound #12 is [name] Diabetic Wound/Ulcer of the Lower Extremity located on the Left,Medial,Plantar Foot . There was [name] T Contact otal Cast Procedure by Geralyn Corwin, MD. Post procedure Diagnosis Wound #12: Same as Pre-Procedure Plan Follow-up Appointments: Return Appointment in 1 week. Nurse Visit as needed Bathing/ Shower/ Hygiene: May shower with wound dressing protected with water repellent cover or cast protector. No tub bath. Anesthetic (Use 'Patient Medications' Section for Anesthetic Order  Entry): Lidocaine applied to wound bed Edema Control - Lymphedema / Segmental Compressive Device / Other: Elevate, Exercise Daily and Avoid Standing for Long Periods of Time. Elevate legs to the level of the heart and pump ankles as often as possible Elevate leg(s) parallel to the floor when sitting. Off-Loading: T Contact Cast to Left Lower Extremity otal Open toe surgical shoe - Heel off loader - Right foot Other: - keep pressure off of feet. Additional Orders / Instructions: Follow Nutritious Diet and Increase Protein Intake Other: - Stay off of foot, wheelchair only WOUND #11: - Foot Wound Laterality: Plantar, Right Cleanser: Wound Cleanser 1 x Per Day/30 Days Discharge Instructions: Wash your hands with soap and water. Remove old dressing, discard into plastic bag and place into trash. Cleanse the wound with Wound Cleanser prior to applying [name] clean dressing using gauze sponges, not tissues or cotton balls. Do not scrub or use excessive force. Pat dry using gauze sponges, not tissue or cotton balls. Topical: Gentamicin 1 x Per Day/30 Days Discharge Instructions: Apply as directed by provider. in clinic Prim Dressing: Aquacel Extra Hydrofiber Dressing, 2x2 (in/in) 1 x Per Day/30 Days ary Secondary Dressing: ABD Pad 5x9 (in/in) (Generic) 1 x Per Day/30 Days Discharge Instructions: Cover with ABD pad Secured With: Medipore T - 31M Medipore H Soft Cloth Surgical T ape ape, 2x2 (in/yd) 1 x Per Day/30 Days Secured With: [employer] Sterile or Non-Sterile 6-ply 4.5x4 (yd/yd) 1 x Per Day/30 Days Discharge Instructions: Apply Kerlix as directed WOUND #12: - Foot Wound Laterality: Plantar, Left, Medial Cleanser: Wound Cleanser 1 x Per Week/30 Days Discharge Instructions: Wash your hands with soap and water. Remove old dressing, discard into plastic bag and place into trash. Cleanse the wound with Wound Cleanser prior to applying [name] clean dressing using gauze sponges, not tissues or cotton  balls. Do not scrub or use excessive force. Pat dry using gauze sponges, not tissue or cotton balls. Topical: Gentamicin 1 x Per Week/30 Days Discharge Instructions: Apply as directed by provider. in clinic Prim Dressing: Hydrofera Blue Ready Transfer Foam, 2.5x2.5 (in/in) 1 x Per Week/30 Days ary Discharge Instructions: Apply Hydrofera Blue Ready to wound bed as directed Secondary Dressing: ABD Pad 5x9 (in/in) (Generic) 1 x Per Week/30 Days Discharge Instructions: Cover with ABD pad Secured With: Medipore T - 31M Medipore H Soft Cloth Surgical T ape ape, 2x2 (in/yd) 1 x Per Week/30 Days Secured With: [employer] Sterile or Non-Sterile 6-ply 4.5x4 (yd/yd) 1 x Per Week/30 Days Discharge Instructions: Apply Kerlix as directed 1. In office sharp debridement 2. T contact cast placed in standard fashion otal 3. Hydrofera  Blue 4. Aquacel Ag 5. Antibiotic ointment 6. Follow-up in 1 week Electronic Signature(s) Signed: 02/15/2023 12:31:39 PM By: Geralyn Corwin DO Entered By: Geralyn Corwin on 02/15/2023 10:56:29 -------------------------------------------------------------------------------- ROS/PFSH Details Patient Name: Date of Service: [name], Shawntia [name]. 02/15/2023 10:00 [name]: [id number] Patient Account Number: [id number] Date of Birth/Sex: Treating RN: 1976/05/30 (47 y.o. Skip Mayer Primary Care Provider: [name] Other Clinician: [name] Referring Provider: Treating Provider/Extender: Chapman Moss in Treatment: [id number] Kamela [name] ([id number]) [id number] Page 19 of 20 Information Obtained From Patient Ear/Nose/Mouth/Throat Medical History: Positive for: Chronic sinus problems/congestion; Middle ear problems Hematologic/Lymphatic Medical History: Positive for: Anemia Respiratory Medical History: Positive for: Chronic Obstructive Pulmonary Disease (COPD) Cardiovascular Medical  History: Positive for: Congestive Heart Failure - EF 33% Endocrine Medical History: Positive for: Type II Diabetes Time with diabetes: 14 years Treated with: Insulin, Oral agents Blood sugar tested every day: No Blood sugar testing results: Bedtime: 176 Genitourinary Medical History: Positive for: End Stage Renal Disease Integumentary (Skin) Medical History: Positive for: History of pressure wounds Neurologic Medical History: Positive for: Neuropathy - feel and legs HBO Extended History Items Ear/Nose/Mouth/Throat: Ear/Nose/Mouth/Throat: Chronic sinus Middle ear problems problems/congestion Immunizations Pneumococcal Vaccine: Received Pneumococcal Vaccination: No Implantable Devices No devices added Family and Social History Current every day smoker; Marital Status - Single; Alcohol Use: Never; Drug Use: Prior History; Caffeine Use: Daily Electronic Signature(s) Signed: 02/15/2023 12:31:39 PM By: Geralyn Corwin DO Signed: 02/15/2023 4:54:10 PM By: Elliot Gurney, BSN, RN, CWS, [name] RN, BSN Entered By: Geralyn Corwin on 02/15/2023 10:58:39 -------------------------------------------------------------------------------- Total Contact Cast Details Patient Name: Date of Service: [name] 02/15/2023 10:00 [name] Christianne Dolin, Fedra [name] ([id number]) [phone].pdf Page 20 of 20 Medical Record Number: [id number] Patient Account Number: [id number] Date of Birth/Sex: Treating RN: 1976-09-03 (47 y.o. Skip Mayer Primary Care Provider: [name] Other Clinician: [name] Referring Provider: Treating Provider/Extender: Chapman Moss in Treatment: [phone] T Contact Cast Applied for Wound Assessment: otal Wound #12 Left,Medial,Plantar Foot Performed By: Physician Geralyn Corwin, MD Post Procedure Diagnosis Same as Pre-procedure Electronic Signature(s) Signed: 02/15/2023 12:31:39 PM By: Geralyn Corwin DO Signed: 02/15/2023 4:32:09  PM By: [name] Entered By: [name] on 02/15/2023 10:52:03 -------------------------------------------------------------------------------- SuperBill Details Patient Name: Date of Service: [name] 02/15/2023 Medical Record Number: [id number] Patient Account Number: [id number] Date of Birth/Sex: Treating RN: [birth year] (47 y.o. Cathlean Cower, [name] Primary Care Provider: [name] Other Clinician: [name] Referring Provider: Treating Provider/Extender: Chapman Moss in Treatment: 48 Diagnosis Coding ICD-10 Codes Code Description [phone] Non-pressure chronic ulcer of other part of left foot with other specified severity 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.621 Type 2 diabetes mellitus with foot ulcer E11.42 Type 2 diabetes mellitus with diabetic polyneuropathy L97.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 of skin Facility Procedures : CPT4 Code: 16606301 Description: 11042 - DEB SUBQ TISSUE 20 SQ CM/< ICD-10 Diagnosis Description 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.621 Type 2 diabetes mellitus with foot ulcer Modifier: Quantity: 1 : CPT4 Code: 60109323 Description: 55732 - APPLY TOTAL CONTACT LEG CAST ICD-10 Diagnosis Description 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 E11.621 Type 2 diabetes mellitus with foot ulcer Modifier: ity Quantity: 1 Physician Procedures : CPT4 Code Description Modifier [id number] -  WC PHYS SUBQ TISS 20 SQ CM ICD-10 Diagnosis Description 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.621 Type 2 diabetes mellitus with foot ulcer Quantity: 1 : [id number] - WC PHYS APPLY TOTAL CONTACT CAST ICD-10 Diagnosis Description 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

## 2023-02-16 NOTE — Progress Notes (Signed)
Heather Heather, Heather Heather ([id number]) [id number] Page 1 of Heather Visit Report for 02/15/2023 Arrival Information Details Patient Name: Date of Service: Heather Heather, Heather Heather 02/15/2023 10:00 Heather Heather Medical Record Number: [id number] Patient Account Number: [id number] Date of Birth/Sex: Treating RN: [name] (47 y.o. Heather Heather Primary Care Heather Heather: Heather Heather Other Clinician: Betha [name] Referring Heather Heather: Treating Heather Heather/Extender: Heather Heather in Treatment: 48 Visit Information History Since Last Visit All ordered tests and consults were completed: No Patient Arrived: Wheel Chair Added or deleted any medications: No Arrival Time: 10:29 Any new allergies or adverse reactions: No Transfer Assistance: EasyPivot Patient Lift Had Heather fall or experienced change in No Patient Identification Verified: Yes activities of daily living that Heather affect Secondary Verification Process Completed: Yes risk of falls: Patient Requires Transmission-Based Precautions: No Signs or symptoms of abuse/neglect since last visito No Patient Has Alerts: No Hospitalized since last visit: No Implantable device outside of the clinic excluding No cellular tissue based products placed in the center since last visit: Has Dressing in Place as Prescribed: Yes Has Footwear/Offloading in Place as Prescribed: Yes Left: T Contact Cast otal Right: Wedge Shoe Pain Present Now: No Electronic Signature(s) Signed: 02/15/2023 4:32:09 PM By: Heather Heather Entered By: Heather Heather on 02/15/2023 10:30:27 -------------------------------------------------------------------------------- Clinic Level of Care Assessment Details Patient Name: Date of Service: Heather Heather, Heather Heather 02/15/2023 10:00 Heather Heather Medical Record Number: [id number] Patient Account Number: [id number] Date of Birth/Sex: Treating RN: [name] (47 y.o. Heather Heather Primary Care Heather Heather: Heather Heather Other  Clinician: Betha [name] Referring Gratia Disla: Treating Heather Heather/Extender: Heather Heather in Treatment: 48 Clinic Level of Care Assessment Items TOOL 1 Quantity Score []  - 0 Use when EandM and Procedure is performed on INITIAL visit ASSESSMENTS - Nursing Assessment / Reassessment []  - 0 General Physical Exam (combine w/ comprehensive assessment (listed just below) when performed on new pt. evals) []  - 0 Comprehensive Assessment (HX, ROS, Risk Assessments, Wounds Hx, etc.) ASSESSMENTS - Wound and Skin Assessment / Reassessment []  - 0 Dermatologic / Skin Assessment (not related to wound area) ASSESSMENTS - Ostomy and/or Continence Assessment and Care []  - 0 Incontinence Assessment and Management []  - 0 Ostomy Care Assessment and Management (repouching, etc.) PROCESS - Coordination of Care []  - 0 Simple Patient / Family Education for ongoing care []  - 0 Complex (extensive) Patient / Family Education for ongoing care []  - 0 Staff obtains Chiropractor, Records, T Results / Process Orders est []  - 0 Staff telephones HHA, Nursing Homes / Clarify orders / etc []  - 0 Routine Transfer to another Facility (non-emergent condition) Dirks, Heather Heather ([id number]) [phone].pdf Page 2 of Heather []  - 0 Routine Hospital Admission (non-emergent condition) []  - 0 New Admissions / Manufacturing engineer / Ordering NPWT Apligraf, etc. , []  - 0 Emergency Hospital Admission (emergent condition) PROCESS - Special Needs []  - 0 Pediatric / Minor Patient Management []  - 0 Isolation Patient Management []  - 0 Hearing / Language / Visual special needs []  - 0 Assessment of Community assistance (transportation, D/C planning, etc.) []  - 0 Additional assistance / Altered mentation []  - 0 Support Surface(s) Assessment (bed, cushion, seat, etc.) INTERVENTIONS - Miscellaneous []  - 0 External ear exam []  - 0 Patient Transfer (multiple staff / Nurse, adult /  Similar devices) []  - 0 Simple Staple / Suture removal (25 or less) []  - 0 Complex Staple / Suture removal (26 or more) []  - 0 Hypo/Hyperglycemic Management (do not check if billed  separately)  - 0 Ankle / Brachial Index (ABI) - do not check if billed separately Has the patient been seen at the hospital within the last three years: Yes Total Score: 0 Level Of Care: ____ Electronic Signature(s) Signed: 02/15/2023 4:32:09 PM By: Heather Heather Entered By: Heather Heather on 02/15/2023 10:52:33 -------------------------------------------------------------------------------- Encounter Discharge Information Details Patient Name: Date of Service: Heather Heather. 02/15/2023 10:00 Heather Heather Medical Record Number: [id number] Patient Account Number: [id number] Date of Birth/Sex: Treating RN: Heather Heather (47 y.o. Heather Heather Primary Care Kyal Arts: Heather Heather Other Clinician: Betha [name] Referring Heather Heather: Treating Heather Heather/Extender: Heather Heather in Treatment: 39 Encounter Discharge Information Items Post Procedure Vitals Discharge Condition: Stable Temperature (F): 98.6 Ambulatory Status: Wheelchair Pulse (bpm): 61 Discharge Destination: Home Respiratory Rate (breaths/min): 16 Transportation: Private Auto Blood Pressure (mmHg): 131/86 Accompanied By: mother Schedule Follow-up Appointment: Yes Clinical Summary of Care: Electronic Signature(s) Signed: 02/15/2023 4:32:09 PM By: Heather Heather Entered By: Heather Heather on 02/15/2023 13:00:22 -------------------------------------------------------------------------------- Lower Extremity Assessment Details Patient Name: Date of Service: Heather Heather, Heather Heather 02/15/2023 10:00 Heather Heather Medical Record Number: [id number] Patient Account Number: [id number] Date of Birth/Sex: Treating RN: [birth year] (47 y.o. Heather Heather Primary Care Heather Heather: Heather Heather Other Clinician: Betha [name] Referring Heather Heather: Treating  Heather Heather/Extender: Heather Heather in Treatment: [address], Heather Heather ([id number]) [id number] Page 3 of Heather Electronic Signature(s) Signed: 02/15/2023 4:32:09 PM By: Heather Heather Signed: 02/15/2023 4:54:10 PM By: Elliot Gurney, BSN, RN, CWS, [name] RN, BSN Entered By: Heather Heather on 02/15/2023 10:43:16 -------------------------------------------------------------------------------- Multi Wound Chart Details Patient Name: Date of Service: Heather Heather. 02/15/2023 10:00 Heather Heather Medical Record Number: [id number] Patient Account Number: [id number] Date of Birth/Sex: Treating RN: [birth year] [name] (47 y.o. Cathlean Cower, [name] Primary Care Deaire Mcwhirter: Heather Heather Other Clinician: Betha [name] Referring Manford Sprong: Treating Meryn Sarracino/Extender: Heather Heather in Treatment: 48 Vital Signs Height(in): 69 Pulse(bpm): 61 Weight(lbs): 178 Blood Pressure(mmHg): 131/86 Body Mass Index(BMI): 26.3 Temperature(F): 98.6 Respiratory Rate(breaths/min): 16 [11:Photos:] [N/Heather:N/Heather] Right, Plantar Foot Left, Medial, Plantar Foot N/Heather Wound Location: Surgical Injury Pressure Injury N/Heather Wounding Event: Open Surgical Wound 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 Etiology: Extremity Chronic sinus problems/congestion, Chronic sinus problems/congestion, N/Heather Comorbid History: Middle ear problems, Anemia, Chronic Middle ear problems, Anemia, Chronic Obstructive Pulmonary Disease Obstructive Pulmonary Disease (COPD), Congestive Heart Failure, (COPD), Congestive Heart Failure, Type II Diabetes, End Stage Renal Type II Diabetes, End Stage Renal Disease, History of pressure wounds, Disease, History of pressure wounds, Neuropathy Neuropathy 12/01/2021 03/16/2020 N/Heather Date Acquired: 48 48 N/Heather Weeks of Treatment: Open Open N/Heather Wound Status: No No N/Heather Wound Recurrence: 0.3x0.3x0.4 0.4x0.5x0.2 N/Heather Measurements L x W x D (cm) 0.071 0.157 N/Heather Heather (cm)  : rea 0.028 0.031 N/Heather Volume (cm) : 96.20% 96.70% N/Heather % Reduction in Area: 98.30% 96.70% N/Heather % Reduction in Volume: Full Thickness Without Exposed Grade 3 N/Heather Classification: Support Structures Small Small N/Heather Exudate Amount: Serous Serous N/Heather Exudate Type: amber amber N/Heather Exudate Color: Distinct, outline attached Flat and Intact N/Heather Wound Margin: None Present (0%) Large (67-100%) N/Heather Granulation Amount: N/Heather Red, Pink N/Heather Granulation Quality: Medium (34-66%) Small (1-33%) N/Heather Necrotic Amount: Fat Layer (Subcutaneous Tissue): Yes Fat Layer (Subcutaneous Tissue): Yes N/Heather Exposed Structures: Fascia: No Fascia: No Tendon: No Tendon: No Muscle: No Muscle: No Joint: No Joint: No Bone: No Bone: No Medium (34-66%) None N/Heather Epithelialization: Treatment Notes Electronic Signature(s) Signed: 02/15/2023 4:32:09 PM By: Sarita Haver, Yazmin Heather ([id number])  [phone].pdf Page 4 of Heather Signed: 02/15/2023 4:32:09 PM By: Heather Heather Entered By: Heather Heather on 02/15/2023 10:45:13 -------------------------------------------------------------------------------- Multi-Disciplinary Care Plan Details Patient Name: Date of Service: Heather Heather. 02/15/2023 10:00 Heather Heather Medical Record Number: [id number] Patient Account Number: [id number] Date of Birth/Sex: Treating RN: [birth year] [name] (47 y.o. Cathlean Cower, [name] Primary Care Brock Larmon: Heather Heather Other Clinician: Betha [name] Referring Ravonda Brecheen: Treating Ledford Goodson/Extender: Heather Heather in Treatment: 56 Active Inactive Abuse / Safety / Falls / Self Care Management Nursing Diagnoses: History of Falls Potential for falls Potential for injury related to falls Goals: Patient will not develop complications from immobility Date Initiated: 03/17/2022 Date Inactivated: 04/20/2022 Target Resolution Date: 03/16/2022 Goal Status: Met Patient/caregiver will verbalize understanding of  skin care regimen Date Initiated: 03/17/2022 Date Inactivated: 09/28/2022 Target Resolution Date: 03/16/2022 Goal Status: Met Patient/caregiver will verbalize/demonstrate measure taken to improve self care Date Initiated: 03/17/2022 Target Resolution Date: 03/16/2022 Goal Status: Active Interventions: Assess fall risk on admission and as needed Provide education on basic hygiene Provide education on personal and home safety Notes: Necrotic Tissue Nursing Diagnoses: Impaired tissue integrity related to necrotic/devitalized tissue Knowledge deficit related to management of necrotic/devitalized tissue Goals: Necrotic/devitalized tissue will be minimized in the wound bed Date Initiated: 03/17/2022 Target Resolution Date: 03/19/2023 Goal Status: Active Patient/caregiver will verbalize understanding of reason and process for debridement of necrotic tissue Date Initiated: 03/17/2022 Date Inactivated: 09/28/2022 Target Resolution Date: 03/16/2022 Goal Status: Met Interventions: Provide education on necrotic tissue and debridement process Treatment Activities: Apply topical anesthetic as ordered : 03/16/2022 Excisional debridement : 03/16/2022 Notes: Osteomyelitis Nursing Diagnoses: Infection: osteomyelitis Knowledge deficit related to disease process and management Goals: Patient/caregiver will verbalize understanding of disease process and disease management Date Initiated: 03/17/2022 Target Resolution Date: 03/16/2022 Goal Status: Active Heather Heather, Heather Heather ([id number]) [id number] Page 5 of Heather Patient's osteomyelitis will resolve Date Initiated: 03/17/2022 Target Resolution Date: 10/26/2022 Goal Status: Active Signs and symptoms for osteomyelitis will be recognized and promptly addressed Date Initiated: 03/17/2022 Target Resolution Date: 10/26/2022 Goal Status: Active Interventions: Assess for signs and symptoms of osteomyelitis resolution every visit Provide  education on osteomyelitis Treatment Activities: Systemic antibiotics : 03/16/2022 Notes: Patient on antibiotics. Pressure Nursing Diagnoses: Knowledge deficit related to management of pressures ulcers Potential for impaired tissue integrity related to pressure, friction, moisture, and shear Goals: Patient will remain free from development of additional pressure ulcers Date Initiated: 11/02/2022 Target Resolution Date: 03/17/2023 Goal Status: Active Patient/caregiver will verbalize risk factors for pressure ulcer development Date Initiated: 11/02/2022 Target Resolution Date: 03/17/2023 Goal Status: Active Patient/caregiver will verbalize understanding of pressure ulcer management Date Initiated: 11/02/2022 Target Resolution Date: 03/17/2023 Goal Status: Active Interventions: Assess: immobility, friction, shearing, incontinence upon admission and as needed Provide education on pressure ulcers Notes: Electronic Signature(s) Signed: 02/15/2023 4:32:09 PM By: Heather Heather Signed: 02/15/2023 4:54:10 PM By: Elliot Gurney, BSN, RN, CWS, [name] RN, BSN Entered By: Heather Heather on 02/15/2023 10:52:54 -------------------------------------------------------------------------------- Pain Assessment Details Patient Name: Date of Service: Heather Heather. 02/15/2023 10:00 Heather Heather Medical Record Number: [id number] Patient Account Number: [id number] Date of Birth/Sex: Treating RN: [birth year] [name] (47 y.o. Heather Heather Primary Care Jamisyn Langer: Heather Heather Other Clinician: Betha [name] Referring Evelean Bigler: Treating Mona Ayars/Extender: Heather Heather in Treatment: 48 Active Problems Location of Pain Severity and Description of Pain Patient Has Paino No Site Locations [place], [place] Heather ([id number]) [id number] Page 6 of Heather Pain Management and Medication Current Pain Management: Electronic Signature(s) Signed: 02/15/2023 4:32:09 PM By:  Heather Heather Signed:  02/15/2023 4:54:10 PM By: Elliot Gurney, BSN, RN, CWS, [name] RN, BSN Entered By: Heather Heather on 02/15/2023 10:33:49 -------------------------------------------------------------------------------- Patient/Caregiver Education Details Patient Name: Date of Service: Heather Heather 4/24/2024andnbsp10:00 Heather Heather Medical Record Number: [id number] Patient Account Number: [id number] Date of Birth/Gender: Treating RN: [name] (47 y.o. Heather Heather Primary Care Physician: Heather Heather Other Clinician: Betha [name] Referring Physician: Treating Physician/Extender: Heather Heather in Treatment: 39 Education Assessment Education Provided To: Patient Education Topics Provided Wound/Skin Impairment: Handouts: Other: continue wound care as directed Methods: Explain/Verbal Responses: State content correctly Electronic Signature(s) Signed: 02/15/2023 4:32:09 PM By: Heather Heather Entered By: Heather Heather on 02/15/2023 12:59:23 -------------------------------------------------------------------------------- Wound Assessment Details Patient Name: Date of Service: Heather Heather. 02/15/2023 10:00 Heather Heather Medical Record Number: [id number] Patient Account Number: [id number] Date of Birth/Sex: Treating RN: [birth year] (47 y.o. Heather Heather Primary Care Amerika Nourse: Heather Heather Other Clinician: Betha [name] Referring Emmajean Ratledge: Treating Teyla Skidgel/Extender: [name] Weeks in Treatment: 48 Wound Status Wound Number: 11 Primary Open Surgical Wound Etiology: Wound Location: Right, Plantar Foot Wound Open Wounding Event: Surgical Injury Status: Date Acquired: 12/01/2021 Heather Heather, Heather Heather ([id number]) [phone].pdf Page 7 of Heather Date Acquired: 12/01/2021 Comorbid Chronic sinus problems/congestion, Middle ear problems, Anemia, Weeks Of Treatment: 48 History: Chronic Obstructive Pulmonary Disease (COPD), Congestive Heart Clustered Wound: No  Failure, Type II Diabetes, End Stage Renal Disease, History of pressure wounds, Neuropathy Photos Wound Measurements Length: (cm) 0.3 Width: (cm) 0.3 Depth: (cm) 0.4 Area: (cm) 0.071 Volume: (cm) 0.028 % Reduction in Area: 96.2% % Reduction in Volume: 98.3% Epithelialization: Medium (34-66%) Wound Description Classification: Full Thickness Without Exposed Support Structures Wound Margin: Distinct, outline attached Exudate Amount: Small Exudate Type: Serous Exudate Color: amber Foul Odor After Cleansing: No Slough/Fibrino Yes Wound Bed Granulation Amount: None Present (0%) Exposed Structure Necrotic Amount: Medium (34-66%) Fascia Exposed: No Necrotic Quality: Adherent Slough Fat Layer (Subcutaneous Tissue) Exposed: Yes Tendon Exposed: No Muscle Exposed: No Joint Exposed: No Bone Exposed: No Treatment Notes Wound #11 (Foot) Wound Laterality: Plantar, Right Cleanser Wound Cleanser Discharge Instruction: Wash your hands with soap and water. Remove old dressing, discard into plastic bag and place into trash. Cleanse the wound with Wound Cleanser prior to applying Heather clean dressing using gauze sponges, not tissues or cotton balls. Do not scrub or use excessive force. Pat dry using gauze sponges, not tissue or cotton balls. Heather-Wound Care Topical Gentamicin Discharge Instruction: Apply as directed by Jaceon Heiberger. in clinic Primary Dressing Aquacel Extra Hydrofiber Dressing, 2x2 (in/in) Secondary Dressing ABD Pad 5x9 (in/in) Discharge Instruction: Cover with ABD pad Secured With Medipore T - 63M Medipore H Soft Cloth Surgical T ape ape, 2x2 (in/yd) Kerlix Roll Sterile or Non-Sterile 6-ply 4.5x4 (yd/yd) Discharge Instruction: Apply Kerlix as directed Compression Wrap Compression Stockings Add-Ons Heather Heather, Heather Heather ([id number]) [phone].pdf Page 8 of Heather Electronic Signature(s) Signed: 02/15/2023 4:32:09 PM By: Heather Heather Signed: 02/15/2023 4:54:10  PM By: Elliot Gurney, BSN, RN, CWS, [name] RN, BSN Entered By: Heather Heather on 02/15/2023 10:42:23 -------------------------------------------------------------------------------- Wound Assessment Details Patient Name: Date of Service: Heather Heather. 02/15/2023 10:00 Heather Heather Medical Record Number: [id number] Patient Account Number: [id number] Date of Birth/Sex: Treating RN: [birth year] [name] (48 y.o. Heather Heather Primary Care Rakwon Letourneau: Heather Heather Other Clinician: Betha [name] Referring Jazalynn Mireles: Treating Kinsley Holderman/Extender: [name] Weeks in Treatment: 48 Wound Status Wound Number: 12 Primary Diabetic Wound/Ulcer of the Lower Extremity Etiology: Wound Location: Left, Medial, Plantar Foot Wound  Open Wounding Event: Pressure Injury Status: Date Acquired: 03/16/2020 Comorbid Chronic sinus problems/congestion, Middle ear problems, Anemia, Weeks Of Treatment: 48 History: Chronic Obstructive Pulmonary Disease (COPD), Congestive Heart Clustered Wound: No Failure, Type II Diabetes, End Stage Renal Disease, History of pressure wounds, Neuropathy Photos Wound Measurements Length: (cm) 0.4 Width: (cm) 0.5 Depth: (cm) 0.2 Area: (cm) 0.157 Volume: (cm) 0.031 % Reduction in Area: 96.7% % Reduction in Volume: 96.7% Epithelialization: None Wound Description Classification: Grade 3 Wound Margin: Flat and Intact Exudate Amount: Small Exudate Type: Serous Exudate Color: amber Foul Odor After Cleansing: No Slough/Fibrino No Wound Bed Granulation Amount: Large (67-100%) Exposed Structure Granulation Quality: Red, Pink Fascia Exposed: No Necrotic Amount: Small (1-33%) Fat Layer (Subcutaneous Tissue) Exposed: Yes Necrotic Quality: Adherent Slough Tendon Exposed: No Muscle Exposed: No Joint Exposed: No Bone Exposed: No Treatment Notes Wound #12 (Foot) Wound Laterality: Plantar, Left, Medial Cleanser Wound Cleanser Discharge Instruction: Wash your hands with soap and  water. Remove old dressing, discard into plastic bag and place into trash. Cleanse the wound with Wound Cleanser prior to applying Heather clean dressing using gauze sponges, not tissues or cotton balls. Do not scrub or use excessive force. Pat dry using gauze sponges, not tissue or cotton balls. EDWARDINE, DESCHEPPER Heather ([id number]) [id number] Page Heather of Heather Heather-Wound Care Topical Gentamicin Discharge Instruction: Apply as directed by Antrice Pal. in clinic Primary Dressing Hydrofera Blue Ready Transfer Foam, 2.5x2.5 (in/in) Discharge Instruction: Apply Hydrofera Blue Ready to wound bed as directed Secondary Dressing ABD Pad 5x9 (in/in) Discharge Instruction: Cover with ABD pad Secured With Medipore T - 74M Medipore H Soft Cloth Surgical T ape ape, 2x2 (in/yd) Kerlix Roll Sterile or Non-Sterile 6-ply 4.5x4 (yd/yd) Discharge Instruction: Apply Kerlix as directed Compression Wrap Compression Stockings Add-Ons Electronic Signature(s) Signed: 02/15/2023 4:32:09 PM By: Heather Heather Signed: 02/15/2023 4:54:10 PM By: Elliot Gurney, BSN, RN, CWS, [name] RN, BSN Entered By: Heather Heather on 02/15/2023 10:43:06 -------------------------------------------------------------------------------- Vitals Details Patient Name: Date of Service: [name], Eulalie Heather. 02/15/2023 10:00 Heather Heather Medical Record Number: [id number] Patient Account Number: [id number] Date of Birth/Sex: Treating RN: [birth year] (47 y.o. Cathlean Cower, [name] Primary Care Londyn Wotton: Heather Heather Other Clinician: Betha [name] Referring Fergie Sherbert: Treating Zsazsa Bahena/Extender: Heather Heather in Treatment: 48 Vital Signs Time Taken: 10:30 Temperature (F): 98.6 Height (in): 69 Pulse (bpm): 61 Weight (lbs): 178 Respiratory Rate (breaths/min): 16 Body Mass Index (BMI): 26.3 Blood Pressure (mmHg): 131/86 Reference Range: 80 - 120 mg / dl Electronic Signature(s) Signed: 02/15/2023 4:32:09 PM By: Heather Heather Entered  By: Heather Heather on 02/15/2023 10:33:28

## 2023-02-22 ENCOUNTER — Encounter: Payer: Medicaid Other | Attending: Internal Medicine | Admitting: Internal Medicine

## 2023-02-22 DIAGNOSIS — L97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 of skin: Secondary | ICD-10-CM | POA: Insufficient documentation

## 2023-02-22 DIAGNOSIS — L97522 Non-pressure chronic ulcer of other part of left foot with fat layer exposed: Secondary | ICD-10-CM | POA: Insufficient documentation

## 2023-02-22 DIAGNOSIS — L97528 Non-pressure chronic ulcer of other part of left foot with other specified severity: Secondary | ICD-10-CM | POA: Diagnosis not present

## 2023-02-22 DIAGNOSIS — E11621 Type 2 diabetes mellitus with foot ulcer: Secondary | ICD-10-CM

## 2023-02-22 DIAGNOSIS — L97512 Non-pressure chronic ulcer of other part of right foot with fat layer exposed: Secondary | ICD-10-CM | POA: Diagnosis not present

## 2023-02-22 DIAGNOSIS — Z794 Long term (current) use of insulin: Secondary | ICD-10-CM | POA: Insufficient documentation

## 2023-02-22 DIAGNOSIS — E11622 Type 2 diabetes mellitus with other skin ulcer: Secondary | ICD-10-CM | POA: Diagnosis not present

## 2023-02-22 DIAGNOSIS — E1142 Type 2 diabetes mellitus with diabetic polyneuropathy: Secondary | ICD-10-CM | POA: Insufficient documentation

## 2023-02-23 ENCOUNTER — Other Ambulatory Visit: Payer: Self-pay | Admitting: Internal Medicine

## 2023-02-23 DIAGNOSIS — N184 Chronic kidney disease, stage 4 (severe): Secondary | ICD-10-CM

## 2023-02-24 NOTE — Progress Notes (Signed)
Heather, [name] [name] ([id number]) [id number] Page 1 of 21 Visit Report for 02/22/2023 Chief Complaint Document Details Patient Name: Date of Service: Heather, [name] 02/22/2023 10:45 [name]: [id number] Patient Account Number: [id number] Date of Birth/Sex: Treating RN: [birth year] (47 y.o. Skip Mayer Primary Care Provider: [name] Other Clinician: [name] Referring Provider: Treating Provider/Extender: Chapman Moss in Treatment: 21 Information Obtained from: Patient Chief Complaint 03/19/2021; patient referred by Dr. Excell Seltzer who has been looking after her left foot for quite [name] period of time for review of [name] nonhealing area in the left midfoot 03/12/2022; bilateral feet wounds and right lower extremity wound. Electronic Signature(s) Signed: 02/22/2023 12:46:15 PM By: Geralyn Corwin DO Entered By: Geralyn Corwin on 02/22/2023 11:51:40 -------------------------------------------------------------------------------- Debridement Details Patient Name: Date of Service: [name] 02/22/2023 10:45 [name]: [id number] Patient Account Number: [id number] Date of Birth/Sex: Treating RN: [birth year] (47 y.o. Cathlean Cower, [name] Primary Care Provider: [name] Other Clinician: [name] Referring Provider: Treating Provider/Extender: Chapman Moss in Treatment: 49 Debridement Performed for Assessment: Wound #12 Left,Medial,Plantar Foot Performed By: Physician Geralyn Corwin, MD Debridement Type: Debridement Severity of Tissue Pre Debridement: Fat layer exposed Level of Consciousness (Pre-procedure): Awake and Alert Pre-procedure Verification/Time Out Yes - 11:12 Taken: Start Time: 11:12 Percent of Wound Bed Debrided: 100% T Area Debrided (cm): otal 0.35 Tissue and other material debrided: Viable, Non-Viable, Callus, Slough, Skin: Dermis , Skin: Epidermis,  Slough Level: Skin/Epidermis Debridement Description: Selective/Open Wound Instrument: Curette Bleeding: Minimum Hemostasis Achieved: Pressure Response to Treatment: Procedure was tolerated well Level of Consciousness (Post- Awake and Alert procedure): Post Debridement Measurements of Total Wound Length: (cm) 0.9 Width: (cm) 0.5 Depth: (cm) 0.2 Volume: (cm) 0.071 Character of Wound/Ulcer Post Debridement: Stable Severity of Tissue Post Debridement: Fat layer exposed Post Procedure Diagnosis Same as Pre-procedure Electronic Signature(s) Signed: 02/22/2023 12:46:15 PM By: Geralyn Corwin DO Signed: 02/22/2023 4:46:34 PM By: [name] Signed: 02/23/2023 4:56:06 PM By: Elliot Gurney, BSN, RN, CWS, [name] RN, BSN Entered By: [name] on 02/22/2023 11:14:45 [name] ([id number]) [id number] Page 2 of 21 -------------------------------------------------------------------------------- Debridement Details Patient Name: Date of Service: Heather, [name] 02/22/2023 10:45 [name]: [id number] Patient Account Number: [id number] Date of Birth/Sex: Treating RN: 1975-11-19 (47 y.o. Cathlean Cower, [name] Primary Care Provider: [name] Other Clinician: [name] Referring Provider: Treating Provider/Extender: Chapman Moss in Treatment: 49 Debridement Performed for Assessment: Wound #11 Right,Plantar Foot Performed By: Physician Geralyn Corwin, MD Debridement Type: Debridement Level of Consciousness (Pre-procedure): Awake and Alert Pre-procedure Verification/Time Out Yes - 11:15 Taken: Start Time: 11:15 Percent of Wound Bed Debrided: 100% T Area Debrided (cm): otal 0.06 Tissue and other material debrided: Viable, Non-Viable, Slough, Subcutaneous, Skin: Dermis , Skin: Epidermis, Slough Level: Skin/Subcutaneous Tissue Debridement Description: Excisional Instrument: Curette Bleeding: Minimum Hemostasis  Achieved: Pressure Response to Treatment: Procedure was tolerated well Level of Consciousness (Post- Awake and Alert procedure): Post Debridement Measurements of Total Wound Length: (cm) 0.4 Width: (cm) 0.2 Depth: (cm) 0.4 Volume: (cm) 0.025 Character of Wound/Ulcer Post Debridement: Stable Post Procedure Diagnosis Same as Pre-procedure Electronic Signature(s) Signed: 02/22/2023 12:46:15 PM By: Geralyn Corwin DO Signed: 02/22/2023 4:46:34 PM By: [name] Signed: 02/23/2023 4:56:06 PM By: Elliot Gurney, BSN, RN, CWS, [name] RN, BSN Entered By: [name] on 02/22/2023 11:15:51 -------------------------------------------------------------------------------- HPI Details Patient Name: Date of Service: [name], [name] 02/22/2023 10:45 [name]:  [id number] Patient Account Number: [id number] Date of Birth/Sex: Treating RN: 1976/09/05 (47 y.o. Skip Mayer Primary Care Provider: [name] Other Clinician: [name] Referring Provider: Treating Provider/Extender: Chapman Moss in Treatment: 81 History of Present Illness HPI Description: 01/18/18-She is here for initial evaluation of the left great toe ulcer. She is [name] poor historian in regards to timeframe in detail. She states approximately 4 weeks ago she lacerated her toe on something in the house. She followed up with her primary care who placed her on Bactrim and ultimately [name] second dose of Bactrim prior to coming to wound clinic. She states she has been treating the toe with peroxide, Betadine and [name] Band-Aid. She did not check her blood sugar this morning but checked it yesterday morning it was 327; she is unaware of [name] recent A1c and there are no current records. She saw Dr. she would've orthopedics last week for an old injury to the left ankle, she states he did not see her toe, nor did she bring it to his attention. She smokes approximately 1 pack cigarettes [name] day. Her social situation  is concerning, she arrives this morning with her mother who appears extremely intoxicated/under the influence; her mother was asked to leave the room and be monitored by the patient's grandmother. The patient's aunt then accompanied the patient and the room throughout the rest of the appointment. We had [name] lengthy discussion regarding the deleterious effects of uncontrolled hyperglycemia and smoking as it relates to wound healing and overall health. She was strongly encouraged to decrease her smoking and get her diabetes under better control. She states she is currently on [name] diet and has cut down her [facility] consumption. The left toe is erythematous, macerated and slightly edematous with malodor present. The edema in her left foot is below her baseline, there is no erythema streaking. We will treat her with Santyl, doxycycline; we have ordered and xray, culture and provided [name] Peg assist surgical shoe and cultured the wound. 01/25/18-She is here in follow-up evaluation for [name] left great toe ulcer and presents with an abscess to her suprapubic area. She states her blood sugars remain elevated, feeling "sick" and if levels are below 250, but she is trying. She has made no attempt to decrease her smoking stating that we "can't take away her food in her cigarettes". She has been compliant with offloading using the PEG assist you. She is using Santyl daily. the culture obtained last week grew staph aureus and Enterococcus faecalis; continues on the doxycycline and Augmentin was added on Monday. The suprapubic area has erythema, no femoral variation, purple discoloration, minimal induration, was accessed with [name] cotton tip applicator with sanguinopurulent drainage, this was cultured, I suspect the HILINAI, SACCENTE [name] ([id number]) [id number] Page 3 of 21 current antibiotic treatment will cover and we will not add anything to her current treatment plan. She was advised to go to urgent  care or ER with any change in redness, induration or fever. 02/01/18-She is here in follow-up evaluation for left great toe ulcers and [name] new abdominal abscess from last week. She was able to use packing until earlier this week, where she "forgot it was there". She states she was feeling ill with GI symptoms last week and was not taking her antibiotic. She states her glucose levels have been predominantly less than 200, with occasional levels between 200-250. She thinks this was contributing to her GI symptoms as they have resolved without intervention. There continues to  be significant laceration to left toe, otherwise it clinically looks stable/improved. There is now less superficial opening to the lateral aspect of the great toe that was residual blister. We will transition to [facility] to all wounds, she will continue her Augmentin. If there is no change or deterioration next week for reculture. 02/08/18-She is here in follow-up evaluation for left great toe ulcer and abdominal ulcer. There is an improvement in both wounds. She has been wrapping her left toe with coban, not by our direction, which has created an area of discoloration to the medial aspect; she has been advised to NOT use coban secondary to her neuropathy. She states her glucose levels have been high over this last week ranging from 200-350, she continues to smoke. She admits to being less compliant with her offloading shoe. We will continue with same treatment plan and she will follow-up next week. 02/15/18-She is here in follow-up evaluation for left great toe ulcer and abdominal ulcer. The abdominal ulcer is epithelialized. The left great toe ulcer is improved and all injury from last week using the Coban wrap is resolved, the lateral ulcer is healed. She admits to noncompliance with wearing offloading shoe and admits to glucose levels being greater than 300 most of the week. She continues to smoke and expresses no desire to  quit. There is one area medially that probes deeper than it has historically, erythema to the toe and dorsal foot has consistently waxed and waned. There is no overt signs of cellulitis or infection but we will culture the wound for any occult infection given the new area of depth and erythema. We will hold off on sensitivities for initiation of antibiotic therapy. 02/22/18-She is here in follow up evaluation for left great toe ulcer. There is overall significant improvement in both wound appearance, erythema and edema with changes made last week. She was not initiated on antibiotic therapy. Culture obtained last week showed oxacillin sensitive staph aureus, sensitive to clindamycin. Clindamycin has been called into the pharmacy but she has been instructed to hold off on initiation secondary to overall clinical improvement and her history of antibiotic intolerance. She has been instructed to contact the clinic with any noted changes/deterioration and the wound, erythema, edema and/or pain. She will follow-up next week. She continues to smoke and her glucose levels remain elevated >250; she admits to compliance with offloading shoe 03/01/18 on evaluation today patient appears to be doing fairly well in regard to her left first toe ulcer. She has been tolerating the dressing changes with the [facility] Dressing without complication and overall this has definitely showed signs of improvement according to records as well is what the patient tells me today. I'm very pleased in that regard. She is having no pain today 03/08/18 She is here for follow up evaluation of [name] left great toe ulcer. She remains non-compliant with glucose control and smoking cessation; glucose levels consistently >200. She states that she got new shoe inserts/peg assist. She admits to compliance with offloading. Since my last evaluation there is significant improvement. We will switch to prisma at this time and she will follow up next  week. She is noted to be tachycardic at this appointment, heart rate 120s; she has [name] history of heart rate 70-130 according to our records. She admits to extreme agitation r/t personal issues; she was advised to monitor her heartrate and contact her physician if it does not return to [name] more normal range (<100). She takes cardizem twice daily. 03/15/18-She  is here in follow-up evaluation for left great toe ulcer. She remains noncompliant with glucose control and smoking cessation. She admits to compliance with wearing offloading shoe. The ulcer is improved/stable and we will continue with the same treatment plan and she will follow-up next week 03/22/18-She is here for evaluation for left great toe ulcer. There continues to be significant improvement despite recurrent hyperglycemia (over 500 yesterday) and she continues to smoke. She has been compliant with offloading and we will continue with same treatment plan and she will follow-up next week. 03/29/18-She is here for evaluation for left great toe ulcer. Despite continuing to smoke and uncontrolled diabetes she continues to improve. She is compliant with offloading shoe. We will continue with the same treatment plan and she will follow-up next week 04/05/18- She is here in follow up evaluation for [name] left great toe ulcer; she presents with small pustule to left fifth toe (resembles ant bite). She admits to compliance with wearing offloading shoe; continues to smoke or have uncontrolled blood glucose control. There is more callus than usual with evidence of bleeding; she denies known trauma. 04/12/18-She is here for evaluation of left great toe ulcer. Despite noncompliance with glycemic control and smoking she continues to make improvement. She continues to wear offloading shoe. The pustule, that was identified last week, to the left fifth toe is resolved. She will follow-up in 2 weeks 05/03/18-she is seen in follow-up evaluation for [name] left great toe ulcer.  She is compliant with offloading, otherwise noncompliant with glycemic control and smoking. She has plateaued and there is minimal improvement noted. We will transition to [facility], replaced the insert to her surgical shoe and she will follow-up in one week 05/10/18- She is here in follow up evaluation for [name] left great toe ulcer. It appears stable despite measurement change. We will continue with same treatment plan and follow up next week. 05/24/18-She is seen in follow-up evaluation for [name] left great toe ulcer. She remains compliant with offloading, has made significant improvement in her diet, decreasing the amount of sugar/soda. She said her recent A1c was 10.9 which is lower than. She did see [name] diabetic nutritionist/educator yesterday. She continues to smoke. We will continue with the same treatment plan and she'll follow-up next week. 05/31/18- She is seen in follow-up evaluation for left great toe ulcer. She continues to remain compliant with offloading, continues to make improvement in her diet, increasing her water and decreasing the amount of sugar/soda. She does continue to smoke with no desire to quit. We will apply Prisma to the depth and Hydrofera Blue over. We have not received insurance authorization for oasis. She will follow up next week. 06/07/18-She is seen in follow-up evaluation for left great toe ulcer. It has stalled according to today's measurements although base appears stable. She says she saw [name] diabetic educator yesterday; her average blood sugars are less than 300 which is an improvement for her. She continues to smoke and states "that's my next step" She continues with water over soda. We will order for xray, culture and reinstate ace wrap compression prior to placing apligraf for next week. She is voicing no complaints or concerns. Her dressing will change to iodoflex over the next week in preparation for apligraf. 06/14/18-She is seen in follow-up evaluation for left  great toe ulcer. Plain film x-ray performed last week was negative for osteomyelitis. Wound culture obtained last week grew strep B and OSSA; she is initiated on keflex and cefdinir today; there is  erythema to the toe which could be from ace wrap compression, she has [name] history of wrapping too tight and has has been encouraged to maintain ace wraps that we place today. We will hold off on application of apligraf today, will apply next week after antibiotic therapy has been initiated. She admits today that she has resumed taking [name] shower with her foot/toe submerged in water, she has been reminded to keep foot/toe out of the bath water. She will be seen in follow up next week 06/21/18-she is seen in follow-up evaluation for left great toe ulcer. She is tolerating antibiotic therapy with no GI disturbance. The wound is stable. Apligraf was applied today. She has been decreasing her smoking, only had 4 cigarettes yesterday and 1 today. She continues being more compliant in diabetic diet. She will follow-up next week for evaluation of site, if stable will remove at 2 weeks. 06/28/18- She is here in follow up evalution. Apligraf was placed last week, she states the dressing fell off on Tuesday and she was dressing with hydrofera blue. She is healed and will be discharged from the clinic today. She has been instructed to continue with smoking cessation, continue monitoring glucose levels, offloading for an additional 4 weeks and continue with hydrofera blue for additional two weeks for any possible microscopic opening. Readmission: 08/07/18 on evaluation today patient presents for reevaluation concerning the ulcer of her right great toe. She was previously discharged on 06/28/18 healed. Nonetheless she states that this began to show signs of drainage she subsequently went to her primary care provider. Subsequently an x-ray was performed on 08/01/18 which was negative. The patient was also placed on antibiotics at  that time. Fortunately they should have been effective for the infection. Nonetheless she's been experiencing some improvement but still has [name] lot of drainage coming from the wound itself. 08/14/18 on evaluation today patient's wound actually does show signs of improvement in regard to the erythema at this point. She has completed the antibiotics. With that being said we did discuss the possibility of placing her in [name] total contact cast as of today although I think that I may want to give this just [name] little bit more time to ensure nothing recurrence as far as her infection is concerned. I do not want to put in the cast and risk infection at that time if things are not completely resolved. With that being said she is gonna require some debridement today. 08/21/18 on evaluation today patient actually appears to be doing okay in regard to her toe ulcer. She's been tolerating the dressing changes without complication. With that being said it does appear that she is ready and in fact I think it's appropriate for [place] to go ahead and initiate the total contact cast today. Nonetheless she will require some sharp debridement to prepare the wound for application. Overall I feel like things have been progressing well but we do need to do something to get this to close more readily. 08/24/18 patient seen today for reevaluation after having had the total contact cast applied on Tuesday. She seems to have done very well the wound appears to be doing great and overall I'm pleased with the progress that she's made. There were no abnormal areas of rubbing from the cast on her lower extremity. 08/30/18 on evaluation today patient actually appears to be completely healed in regard to her plantar toe ulcer. She tells me at this point she's been having [name] lot AGATHE, MCCLUNEY [name] ([id number]) [id number]  Page 4 of 21 of issues with the cast. She almost fell [name] couple of times the state shall the step of  her dog [name] couple times as well. This is been [name] very frustrating process for her other nonetheless she has completely healed the wound which is excellent news. Overall there does not appear to be the evidence of infection at this time which is great news. 09/11/18 evaluation today patient presents for follow-up concerning her great toe ulcer on the left which has unfortunately reopened since I last saw her which was only [name] couple of weeks ago. Unfortunately she was not able to get in to get the shoe and potentially the AFO that's gonna be necessary due to her left foot drop. She continues with offloading shoe but this is not enough to prevent her from reopening it appears. When we last had her in the total contact cast she did well from [name] healing standpoint but unfortunately the wound reopened as soon as she came out of the cast within just [name] couple of weeks. 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 This is also part of the reason why she falls on [name] regular basis. I just do not believe that is gonna be safe for [place] to reinitiate the total contact cast as last time we had this on she fell 3 times one week which is definitely not normal for her. 09/18/18 upon evaluation today the patient actually appears to be doing about the same in regard to her toe ulcer. She did not contact [facility] as I asked her to even though I had given her the prescription. In fact she actually states that she has no idea where the prescription is. She did apparently call [facility] and they told her that all she needed to do was bring the prescription in order to be able to be seen and work on getting the AFO for her left foot. With all that being said she still does not have an appointment and I'm not sure were things stand that regard.  I will give her [name] new prescription today in order to contact them to get this set up. 09/25/18 on evaluation today patient actually appears to be doing about the same in regard to her toes ulcer. She does have [name] small areas which seems to have [name] lot of callous buildup around the edge of the wound which is going to need sharp debridement today. She still is waiting to be scheduled for evaluation with [facility] for possibility of an AFO. She states there supposed to call her tomorrow to get this set up. Unfortunately it does appear that her foot specifically the toe area is showing signs of erythema. There does not appear to be any systemic infection which is in these good news. 10/02/18 on evaluation today patient actually appears to be doing about the same in regard to her toe ulcer. This really has not done too well although it's not significantly larger it's also not significantly smaller. She has been tolerating the dressing changes without complication. She actually has her appointment with [facility] and [facility] tomorrow to hopefully be measured for obtaining and AFO splint. I think this would be helpful preventing this from reoccurring. We had contemplated starting the cast this week although to  be honest I am reluctant to do that as she's been having nausea, vomiting, and seizure activity over the past three days. She has [name] history of seizures and have been told is nothing that can be done for these. With that being said I do believe that along with the seizures have the nausea vomiting which upon further questioning doesn't seem to be the normal for her and makes me concerned for the possibility of infection or something else going on. I discussed this with the patient and her mother during the office visit today. I do not feel the wound is effective but maybe something else. The responses this was "this just happens to her at times and we don't know why". They did not seem to be interested in going  to the hospital to have this checked out further. 10/09/18 on evaluation today patient presents for follow-up concerning her ongoing toe ulcer. She has been tolerating the dressing changes without complication. Fortunately there does not appear to be any evidence of infection which is great news however I do think that the patient would benefit from going ahead for with the total contact cast. She's actually in [name] wheelchair today she tells me that she will use her walker if we initiate the cast. I was very specific about the fact that if we were gonna do the cast I wanted to make sure that she was using the walker in order to prevent any falls. She tells me she does not have stairs that she has to traverse on [name] regular basis at her home. She has not had any seizures since last week again that something that happens to her often she tells me she did talk to [employer] and they said that it may take up to three weeks to get the brace approved for her. Hopefully that will not take that long but nonetheless in the meantime I do think the cast could be of benefit. 10/12/18 on evaluation today patient appears to be doing rather well in regard to her toe ulcer. It's just been [name] few days and already this is significantly improved both as far as overall appearance and size. Fortunately there's no sign of infection. She is here for her first obligatory cast change. 10/19/18 Seen today for follow up and management of left great toe ulcer. Wound continues to show improvement. Noted small open area with seroussang drainage with palpation. Denies any increased pain or recent fevers during visit. She will continue calcium alginate with offloading shoe. Denies any questions or concerns during visit. 10/26/18 on evaluation today patient appears to be doing about the same as when I last saw her in regard to her wound bed. Fortunately there does not appear to be any signs of infection. Unfortunately she continues to have [name]  breakdown in regard to the toe region any time that she is not in the cast. It takes almost no time at all for this to happen. Nonetheless she still has not heard anything from the brace being made by [employer] as to when exactly this will be available to her. Fortunately there is no signs of infection at this time. 10/30/18 on evaluation today patient presents for application of the total contact cast as we just received him this morning. Fortunately we are gonna be able to apply this to her today which is great news. She continues to have no significant pain which is good news. Overall I do feel like things have been improving while she was the  cast is when she doesn't have [name] cast that things get worse. She still has not really heard anything from [employer] regarding her brace. 11/02/18 upon evaluation today patient's wound already appears to be doing significantly better which is good news. Fortunately there does not appear to be any signs of infection also good news. Overall I do think the total contact cast as before is helping to heal this area unfortunately it's just not gonna likely keep the area closed and healed without her getting her brace at least. Again the foot drop is [name] significant issue for her. 11/09/18 on evaluation today patient appears to be doing excellent in regard to her toe ulcer which in fact is completely healed. Fortunately we finally got the situation squared away with the paperwork which was needed to proceed with getting her brace approved by Medicaid. I have filled that out unfortunately that information has been sent to the orthopedic office that I worked at 2 1/2 years ago and not tired Current wound care measures. Fortunately she seems to be doing very well at this time. 11/23/18 on evaluation today patient appears to be doing More Poorly Compared to Last Time I Saw Her. At [facility] She Had Completely Healed. Currently she is continuing to have issues with reopening. She  states that she just found out that the brace was approved through Medicaid now she just has to go get measured in order to have this fitted for her and then made. Subsequently she does not have an appointment for this yet that is going to complicate things we obviously cannot put her back in the cast if we do not have everything measured because they're not gonna be able to measure her foot while she is in the cast. 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 Obviously this is unfortunate and does have me somewhat worried as well. 11/30/18 on evaluation today patient's toe ulcer actually appears to be doing fairly well. The good news is she will be getting her brace in the shoes next week on Wednesday. Hopefully we will be able to get this to heal without having to go back in the cast however she may need the cast in order to get the wound completely heal and then go from there. Fortunately there's no signs of infection at this time. 12/07/18 on evaluation today patient fortunately did receive her brace and she states she could tell this definitely makes her walk better. With that being said she's been having issues with her toe where she noticed yesterday there was [name] lot of tissue that was loosing off this appears to be much larger than what it was previous. She also states that her leg has been read putting much across the top of her foot just about the ankle although this seems to be receiving somewhat. The total area is still red and appears to be someone infected as best I can tell. She is previously taken Bactrim and that may be [name] good option for her today as well. We are gonna see what I wound culture shows as well and I think that this is definitely appropriate. With that being said outside of the culture I still need to initiate something in the interim and that's what I'm gonna go ahead and select  Bactrim is [name] good option for her. 12/14/18 on evaluation today patient appears to be doing  better in regard to her left great toe ulcer as compared to last week's evaluation. There's still some erythema although this is significantly improved which is excellent news. Overall I do believe that she is making good progress is still gonna take some time before she is where I would like her to be from the standpoint of being able to place her back into the total contact cast. Hopefully we will be where we need to be by next week. 12/21/18 on evaluation today patient actually appears to be doing poorly in regard to her toe ulcer. She's been tolerating the dressing changes without complication. Fortunately there's no signs of systemic infection although she does have [name] lot of drainage from the toe ulcer and this does seem to be causing some issues at this point. She does have erythema on the distal portion of her toe that appears to be likely cellulitis. [name], [name] ([id number]) [id number] Page 5 of 21 12/28/18 on evaluation today patient actually appears to be doing [name] little better in my pinion in regard to her toe ulcer. With that being said she still does have some evidence of infection at this time and for her culture she had both E. coli as well as enterococcus as organisms noted on evaluation. For that reason I think that though the Keflex likely has treated the E. coli rather well this has really done nothing for the enterococcus. We are going to have to initiate treatment for this specifically. 01/04/19 on evaluation today patient's toe actually appears to be doing better from the standpoint of infection. She currently would like to see about putting the cash back on I think that this is appropriate as long as she takes care of it and keeps it from getting wet. She is gonna have some drainage we can definitely pass this up with Drawtex and alginate to try to prevent as  much drainage as possible from causing the problems. With that being said I do want to at least try her with the cast between now and Tuesday. If there any issues we can't continue to use it then I will discontinue the use of the cast at that point. 01/08/19 on evaluation today patient actually appears to be doing very well as far as her foot ulcer specifically the great toe on the left is concerned. She did have an area of rubbing on the medial aspect of her left ankle which again is from the cast. Fortunately there's no signs of infection at this point in this appears to be [name] very slight skin breakdown. The patient tells me she felt it rubbing but didn't think it was that bad. Fortunately there is no signs of active infection at this time which is good news. No fevers, chills, nausea, or vomiting noted at this time. 01/15/19 on evaluation today patient actually appears to be doing well in regard to her toe ulcer. Again as previous she seems to do well and she has the cast on which indicates to me that during the time she doesn't have [name] cast on she's putting way too much pressure on this region. Obviously I think that's gonna be an issue as with the current national emergency concerning the Covid-19 Virus it has been recommended that we discontinue the use of total contact casting by the chief medical officer of our company, Dr. Maurine Minister. The reasoning is that if [name] patient becomes sick and cannot come into have the cast removed they could not just leave this on  for an additional two weeks. Obviously the hospitals also do not want to receive patient's who are sick into the emergency department to potentially contaminate the region and spread the Covid-19 Virus among other sick individuals within the hospital system. Therefore at this point we are suspending the use of total contact cast until the current emergency subsides. This was all discussed with the patient today as well. 01/22/19 on evaluation today  patient's wound on her left great toe appears to be doing slightly worse than previously noted last week. She tells me that she has been on this quite [name] bit in fact she tells me she's been awake for 38 straight hours. This is due to the fact that she's having to care for grandparents because nobody else will. She has been taking care of them for five the last seven days since I've seen her they both have dementia his is from [name] stroke and her grandmother's was progressive. Nonetheless she states even her mom who knows her condition and situation has only help two of those days to take care of them she's been taking care of the rest. Fortunately there does not appear to be any signs of active infection in regard to her toe at this point although obviously it doesn't look as good as it did previous. I think this is directly related to her not taking off the pressure and friction by way of taking things easy. Though I completely understand what's going on. 01/29/19 on evaluation today patient's tools are actually appears to be showing some signs of improvement today compared to last week's evaluation as far as not necessarily the overall size of the wound but the fact that she has some new skin growth in between the two ends of the wound opening. Overall I feel like she has done well she states that she had [name] family member give her what sounds to be [name] CAM walker boot which has been helpful as well. 02/05/19 on evaluation today patient's wound bed actually appears to be doing significantly better in regard to her overall appearance of the size of the wound. With that being said she is still having an issue with offloading efficiently enough to get this to close. Apparently there is some signs of infection at this point as well unfortunately. Previously she's done well of Augmentin I really do not see anything that needs to be culture currently but there theme and cellulitis of the foot that I'm seeing I'm gonna  go ahead and place her on an antibiotic today to try to help clear this up. 02/12/2019 on evaluation today patient actually appears to be doing poorly in regard to her overall wound status. She tells me she has been using her offloading shoe but actually comes in today wearing her tennis shoe with the AFO brace. Again as I previously discussed with her this is really not sufficient to allow the area to heal appropriately. Nonetheless she continues to be somewhat noncompliant and I do wonder based on what she has told my nurse in the past as to whether or not [name] good portion of this noncompliance may be recreational drug and alcohol related. She has had [name] history of heroin overdose and this was fairly recently in the past couple of months that have been seeing her. Nonetheless overall I feel like her wound looks significantly worse today compared to what it was previous. She still has significant erythema despite the Augmentin I am not sure that this is an  appropriate medication for her infection I am also concerned that the infection may have gone down into her bone. 02/19/19 on evaluation today patient actually appears to be doing about the same in regard to her toe ulcer. Unfortunately she continues to show signs of bone exposure and infection at this point. There does not appear to be any evidence of worsening of the infection but I'm also not really sure that it's getting significantly better. She is on the Augmentin which should be sufficient for the Staphylococcus aureus infection that she has at this point. With that being said she may need IV antibiotics to more appropriately treat this. We did have [name] discussion today about hyperbaric option therapy. 02/28/19 on evaluation today patient actually appears to be doing much worse in regard to the wound on her left great toe as compared to even my previous evaluation last week. Unfortunately this seems to be training in [name] pretty poor direction. Her toe  was actually now starting to angle laterally and I can actually see the entire joint area of the proximal portion of the digit where is the distal portion of the digit again is no longer even in contact with the joint line. Unfortunately there's [name] lot more necrotic tissue around the edge and the toe appears to be showing signs of becoming gangrenous in my pinion. I'm very concerned about were things stand at this point. She did see infectious disease and they are planning to send in [name] prescription for Sivextro for her and apparently this has been approved. With that being said I don't think she should avoid taking this but at the same time I'm not sure that it's gonna be sufficient to save her toe at this point. She tells me that she still having to care for grandparents which I think is putting quite [name] bit of strain on her foot and specifically the total area and has caused this to break down even to [name] greater degree than would've otherwise been expected. 03/05/19 on evaluation today patient actually appears to be doing quite well in regard to her toe all things considering. She still has bone exposed but there appears to be much less your thing on overall the appearance of the wound and the toe itself is dramatically improved. She still does have some issues currently obviously with infection she did see vascular as well and there concerned that her blood flow to the toad. For that reason they are setting up for an angiogram next week. 03/14/19 on evaluation today patient appears to be doing very poor in regard to her toe and specifically in regard to the ulceration and the fact that she's starting to notice the toe was leaning even more towards the lateral aspect and the complete joint is visible on the proximal aspect of the joint. Nonetheless she's also noted [name] significant odor and the tip of the toe is turning more dark and necrotic appearing. Overall I think she is getting worse not better as far  as this is concerned. For that reason I am recommending at this point that she likely needs to be seen for likely amputation. READMISSION 03/19/2021 This is [name] patient that we cared for in this clinic for [name] prolonged period of time in 2019 and 2020 with [name] left foot and left first toe wound. 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 Excell Seltzer. In  December 2021 she had an ulcer on her right great toe as well as the fourth and fifth toes. 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 She also had an angiogram at that time and underwent angioplasty of the right anterior tibial artery. In any case she claims that the wound on the right foot is closed I did not look at this today which was probably an oversight although I think that should be done next week. 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 According to Dr. Bernette Redbird last review that she was out of the area being cared for by another physician but recently came back to his attention. 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 [name] culture of this area showed E. coli apparently before she came back to see Dr. Excell Seltzer she was supposed to be receiving antibiotics but she did not really take them. Nor is she offloading this area at all. Finally her last hemoglobin A1c listed in epic was in March 2022 at 14.1 she says things are [name] lot better since then although I am not sure. 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 She had this wound at the time. During this admission she had E. coli sepsis [name] TEE was negative. 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 She is still [name] heavy cigarette smoker. She has [name] history of bipolar disorder seizure disorder [name], [name] ([id number]) [id number] Page 6 of 21 ABI in our clinic was 1.05 6/1; left midfoot in the setting of [name] TMA done previously. Round circular wound with [name] "knuckle" of protruding tissue. The problem is that the knuckle was not attached to any of the surrounding granulation and this probed proximally widely I removed [name] large portion of this tissue. This wound goes with considerable undermining laterally. I do not feel any bone there was no purulence but this is [name] deep wound. 6/8; in spite of the debridement I did last week. She arrives with [name] wound looking exactly the same. [name] protruding "knuckle" of tissue nonadherent to most of the surrounding tissue. There is considerable depth around this from 6-12 o'clock at 2.7 cm and undermining of 1 cm. This does not look overtly infected and the x- ray I did last week was negative for any osseous abnormalities. We have been using silver collagen 6/15; deep tissue culture I did last week showed moderate staph aureus and moderate Pseudomonas. This will definitely require prolonged antibiotic therapy. The pathology on the protuberant area was negative for malignancy fungus etc. 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 We have been using silver collagen. I am going to be prescribing Levaquin for 2 weeks. Her CT scan of the foot is down for 7/5 6/22; CT scan of the foot on 7 5. She says she has hardware in the left leg from her previous fracture. She is on the Levaquin for the deep tissue culture I did that showed methicillin sensitive staph aureus and Pseudomonas. I gave her [name] 2-week supply and she will have  another week. She arrives in clinic today with the same protuberant tissue however  this is nonadherent to the tissue surrounding it. I am really at [name] loss to explain this unless there is underlying deep tissue infection 6/29; patient presents for 1 week follow-up. She has been using collagen to the wound bed. She reports taking her antibiotics as prescribed.She has no complaints or issues today. She denies signs of infection. 7/6; patient presents for one week followup. She has been using collagen to the wound bed. She states she is taking Levaquin however at times she is not able to keep it down. She denies signs of infection. 7/13; patient presents for 1 week follow-up. She has been using silver alginate to the wound bed. She still has nausea when taking her antibiotics. She denies signs of infection. 7/20; patient presents for 1 week follow-up. She has been using silver alginate with gentamicin cream to the wound bed. She denies any issues and has no complaints today. She denies signs of infection. 7/27; patient presents for 1 week follow-up. She continues to use silver alginate with gentamicin cream to the wound bed. She reports starting her antibiotics. She has no issues or complaints. Overall she reports stability to the wound. 8/3; patient presents for 1 week follow-up. She has been using silver alginate with gentamicin cream to the wound bed. She reports completing all antibiotics. She has no issues or complaints today. She denies signs of infection. 8/17; patient presents for 2-week follow-up. He is to use silver alginate to the wound bed. She has no issues or complaints today. She denies signs of infection. She reports her pain has improved in her foot since last clinic visit 8/24; patient presents for 1 week follow-up. She continues to use silver alginate to the wound bed. She has no issues or complaints. She denies signs of infection. Pain is stable. 9/7; patient presents for  follow-up. She missed her last week appointment due to feeling ill. She continues to use silver alginate. She has [name] new wound to the right lower extremity that is covered in eschar. She states It occurred over the past week and has no idea how it started. She currently denies signs of infection. 9/14; patient presents for follow-up. T the left foot wound she has been using gentamicin cream and silver alginate. T the right lower extremity wound she has o o been keeping this covered and has not obtain Santyl. 9/21; patient presents for follow-up. She reports using gentamicin cream and silver alginate to the left foot and Santyl to the right lower extremity wound. She has no issues or complaints today. She denies signs of infection. 9/28; patient presents for follow-up. She reports [name] new wound to her right heel. She states this occurred [name] few days ago and is progressively gotten worse. She has been trying to clean the area with [name] Q-tip and Santyl. She reports stability in the other 2 wounds. She has been using gentamicin cream and silver alginate to the left foot and Santyl to the right lower extremity wound. 10/12; patient presents for follow-up. She reports improvement to the wound beds. She is seeing vein and vascular to discuss the potential of [name] left BKA. She states they are going to do an arteriogram. She continues to use silver alginate with dressing changes to her wounds. 11/2; patient presents for follow-up. She states she has not been doing dressing changes to the wound beds. She states she is not able to offload the areas. She reports chronic pain to her left foot wound. 11/9; patient presents for follow-up. She came  in with only socks on. She states she forgot to put on shoes. It is unclear if she is doing any dressing changes. She currently denies systemic signs of infection. 11/16; patient presents for follow-up. She came again only with socks on. She states she does not wear shoes ever.  It is unclear if she does dressing changes. She currently denies systemic signs of infection. 11/23; patient presents for follow-up. She wore her shoes today. It still unclear exactly what dressing she is using for each wound but she did states she obtained Dakin's solution and has been using this to the left foot wound. She currently denies signs of infection. 11/30; patient presents for follow-up. She has no issues or complaints today. She currently denies signs of infection. 12/7; patient presents for follow-up. She has no issues or complaints today. She has been using Hydrofera Blue to the right heel wound and Dakin solution to the left foot wound. Her right anterior leg wound is healed. She currently denies signs of infection. 12/14; patient presents for follow-up. She has been using Hydrofera Blue to the right heel and Dakin's to the left foot wounds. She has no issues or complaints today. She denies signs of infection. 12/21; patient presents for follow-up. She reports using Hydrofera Blue to the right heel and Dakin's to the left foot wound. She denies signs of infection. 12/28; patient presents for follow-up. She continues to use Dakin's to the left foot wound and Hydrofera Blue to the right heel wound. She denies signs of infection. 1/4; patient presents for follow-up. She has no issues or complaints today. She denies signs of infection. 1/11; patient presents for follow-up. It is unclear if she has been dressing these wounds over the past week. She currently denies signs of infection. 1/18; patient presents for follow-up. She states she has been using Dakin's wet-to-dry dressings to the left foot. She has been using Hydrofera Blue to the right foot foot wound. She states that the anterior right leg wound has reopened and draining serous fluid. She denies signs of infection. 1/25; patient presents for follow-up. She has no issues or complaints today. 2/1; patient presents for follow-up.  She has no issues or complaints today. She denies signs of infection. 2/8; patient presents for follow-up. She has lost her surgical shoes. She did not have [name] dressing to the right heel wound. She currently denies signs of infection. 2/15; patient presents for follow-up. She reports more pain to the right heel today. She denies purulent drainage Or fever/chills 2/22; patient presents for follow-up. She reports taking clindamycin over the past week. She states that she continues to have pain to her right heel. She reports purulent drainage. Readmission 03/16/2022 [name], [name] ([id number]) [id number] Page 7 of 21 Ms. Shequilla Sallie is [name] 47 year old female with [name] past medical history of type 2 diabetes, osteomyelitis to her feet, chronic systolic heart failure and bipolar disorder that presents to the clinic for bilateral feet wounds and right lower extremity wound. She was last seen in our clinic on 12/15/2021. At that time she had purulent drainage coming out of her right plantar foot and I recommended she go to the ED. She states she went to [facility] and has been there for the past 3 months. I cannot see the records. She states she had OR debridement and was on several weeks of IV antibiotics while inpatient. Since discharge she has not been taking care of the wound beds. She had nothing on her feet other  than socks today. She currently denies signs of infection. 5/31; patient presents for follow-up. She has been using Dakin's wet-to-dry dressings to the wound beds on her feet bilaterally and antibiotic ointment to the right anterior leg wound. She had [name] wound culture done at last clinic visit that showed moderate Pseudomonas aeruginosa sensitive to ciprofloxacin. She currently denies systemic signs of infection. 6/14; patient presents for follow-up. She received Keystone 5 days ago and has been using this on the wound beds. She states that last week she  had to go to the hospital because she had increased warmth and erythema to the right foot. She was started on 2 oral antibiotics. She states she has been taking these. She currently denies systemic signs of infection. She has no issues or complaints today. 6/21; patient presents for follow-up. She states she has been using Keystone antibiotics to the wound beds. She has no issues or complaints today. She denies signs of infection. 6/28; patient presents for follow-up. She has been using Keystone antibiotics to the wound beds. She has no issues or complaints today. 7/12; patient presents for follow-up. Has been using Keystone antibiotics to the wound beds with calcium alginate. She has no issues or complaints today. She never followed up with her orthopedic surgeon who did the OR debridement to the right foot. We discussed the total contact cast for the left foot and patient would like to do this next week. 7/19; patient presents for follow-up. She has been using Keystone antibiotics with calcium alginate to the wound beds. She has no issues or complaints today. Patient is in agreement to do the total contact cast of the left foot today. She knows to return later this week for the obligatory cast change. 05-13-2022 upon evaluation today patient's wound which she has the cast of the left leg actually appears to be doing significantly better. Fortunately I do not see any signs of active infection locally or systemically which is great news and overall I am extremely pleased with where we stand currently. 7/26; patient presents for follow-up. She has [name] cast in place for the past week. She states it irritated her shin. Other than that she tolerated the cast well. She states she would like [name] break for 1 week from the cast. We have been using Keystone antibiotic and Aquacel to both wound beds. She denies signs of infection. 8/2; patient presents for follow-up. She has been using Keystone and Aquacel to the  wound beds. She denies any issues and has no complaints. She is agreeable to have the cast placed today for the left leg. 06-03-2022 upon evaluation today patient appears to be doing well with regard to her wound she saw some signs of improvement which is great news. Fortunately I do not see any evidence of active infection locally or systemically at this time which is great news. No fevers, chills, nausea, vomiting, or diarrhea. 8/16; patient presents for follow-up. She has no issues or complaints today. We have been using Keystone and Aquacel to the wound beds. The left lower extremity is in [name] total contact cast. She is tolerated this well. 8/23; patient presents for follow-up. She has had the total contact cast on the left leg for the past week. Unfortunately this has rubbed and broken down the skin to the medial foot. She currently denies signs of infection. She has been using Keystone antibiotic to the right plantar foot wound. 8/30; patient presents for follow-up. We have held off on the total contact cast  for the left leg for the past week. Her wound on the left foot has improved and the previous surrounding breakdown of skin has epithelialized. She has been using Keystone antibiotic to both wound beds. She has no issues or complaints today. She denies signs of infection. 9/6; patient presents for follow-up. She has ordered her's Keystone antibiotic and this is arriving this week. She has been doing Dakin's wet-to-dry dressings to the wound beds. She denies signs of infection. She is agreeable to the total contact cast today. 9/13; patient presents for follow-up. She states that the cast caused her left leg shin to be sore. She would like to take [name] break from the cast this week. She has been using Keystone antibiotic to the right plantar foot wound. She denies signs of infection. 9/20; patient presents for follow-up. She has been using Keystone antibiotics to the wound beds with calcium  alginate to the right foot wound and Hydrofera Blue to the left foot wound. She is agreeable to having the cast placed today. She has been approved for Apligraf and we will order this for next clinic visit. 9/27; patient presents for follow-up. We have been using Keystone antibiotic with Hydrofera Blue to the left foot wound under [name] total contact cast. T the right o foot wound she has been using Keystone antibiotic and calcium alginate. She declines [name] total contact cast today. Apligraf is available for placement and she would like to proceed with this. 07-28-2022 upon evaluation today patient appears to be doing well currently in regard to her wound. She is actually showing signs of significant improvement which is great news. Fortunately I do not see any evidence of active infection locally nor systemically at this time. She has been seeing Dr. Mikey Bussing and to be honest has been doing very well with the cast. Subsequently she comes in today with [name] cast on and we did reapply that today as well. She did not really want to she try to talk me out of that but I explained that if she wanted to heal this is really the right way to go. Patient voiced understanding. In regard to her right foot this is actually [name] lot better compared to the last time I saw her which is also great news. 10/11; patient presents for follow-up. Apligraf and the total contact cast was placed to the left leg at last clinic visit. She states that her right foot wound had burning pain to it with the placement of Apligraf to this area. She has been doing [name] over this area. She denies signs of infection including increased warmth, erythema or purulent drainage. 11/1; 3-week follow-up. The patient fortunately did not have [name] total contact cast or an Apligraf and on the left foot. She has been using Keystone ABD pads and kerlix and her own running shoes She arrives in clinic today with thick callus and [name] very poor surface on the left  foot on the right nonviable skin subcutaneous tissue and [name] deep probing hole. 11/15; patient missed her last clinic appointment. She states she has not been dressing the wound beds for the past 2 weeks. She states that at she had [name] new roommate but is now going back to live with her mother. Apparently its been [name] distracting 2 weeks. Patient currently denies signs of infection. 11/22; patient presents for follow-up. She states she has been using Keystone antibiotic and Dakin's wet-to-dry dressings to the wound beds. She is agreeable for cast placement today. We had ordered  Apligraf however this has not been received by our facility. 11/29; Patient had [name] total contact cast placed at last clinic visit and she tolerated this well. We were using silver alginate under the cast. Patient's been using Keystone antibiotic with Aquacel to the right plantar foot wound. She has no issues or complaints today. Apligraf is available for placement today. Patient would like to proceed with this. 12/6; patient presents for follow-up. She had Apligraf placed in standard fashion last clinic visit under the total contact cast to the left lower extremity. She has been using Keystone antibiotic and Aquacel to the right plantar foot wound. She has no issues or complaints today. 12/13; patient presents for follow-up. She has finished 5 Apligraf placements. Was told she would not qualify for more. We have been doing [name] total contact Gulbranson, Evalin [name] ([id number]) [id number] Page 8 of 21 cast to the left lower extremity. She has been using Keystone antibiotic and Aquacel to the right plantar foot wound. She has no issues or complaints today. 12/20; patient presents for follow-up. We have been using Hydrofera Blue with Keystone antibiotic under [name] total contact cast of the left lower extremity. She reports using Keystone antibiotic and silver alginate to the right heel wound. She has no issues or  complaints today. 12/27; patient presents with [name] healthy wound on the left midfoot. We have Apligraf to apply that to that more also using [name] total contact cast. On the right we are using Keystone and silver alginate. She is offloading the right heel with [name] surgical shoealthough by her admission she is on her feet quite [name] bit 1/3; patient presents for follow-up. Apligraf was placed to the wound beds last clinic visit. She was placed in [name] total contact cast to the left lower extremity. She declines [name] total contact cast today. She states that her mother is in the hospital and she cannot adequately get around with the cast on. 1/10; patient presents for follow-up. She declined the total contact cast at last clinic visit. Both wounds have declined in appearance. She states that she has been on her feet and not offloading the wound beds. She currently denies signs of infection. 1/17; patient presents for follow-up. She had the total contact cast along with Apligraf placed last week to the left lower extremity. She tolerated this well. She has been using Aquacel Ag and Keystone antibiotic to the right heel wound. She currently denies signs of infection. She has no issues or complaints today. 1/24; patient presents for follow-up. We have been using Apligraf to the left foot wound along with [name] total contact cast. She has done well with this. T the right o heel wound she has been using Aquacel Ag and Keystone antibiotic ointment. She has no issues or complaints today. She denies signs of infection. 1/31; patient presents for follow-up. We have been using Apligraf to the left foot wound along with the total contact cast. She continues to do well with this. To the right heel we have been using Aquacel Ag and Keystone antibiotic ointment. She has no issues or complaints today. 12-01-2022 upon evaluation patient is seen today on my schedule due to the fact that she unfortunately was in the hospital yesterday. Her cast  needed to come off the only reason she is out of the hospital is due to the fact that they would not take it off at the hospital which is somewhat bewildered reading to me to be perfectly honest. I am not certain why  this was but either way she was released and then was placed on my schedule today in order to get this off and reapply the total contact casting as appropriate. I do not have an Apligraf for her today it was applied last week and today's actually expired yesterday as there was some scheduling conflicts with her being in the hospital. Nonetheless we do not have that for reapplication today but the good news that she is not draining too much and the Apligraf can go for up to 2 weeks so I am going to go ahead and reapply the total contact casting but we are going to leave the Apligraf in place. 2/14; patient presents for follow-up. T the left leg she has had the total contact cast and Apligraf for the past week. She has had no issues with this. T the o o right heel she has been using antibiotic ointment and Aquacel Ag. 2/21; patient presents for follow-up. We have been using Apligraf and [name] total contact cast to the left lower extremity. She is tolerated this well. Unfortunately she is not approved for any more Apligraf per insurance. She has been using antibiotic ointment and Aquacel Ag to the right foot. She has no issues or complaints today. 2/28; patient presents for follow-up. We have been using Hydrofera Blue and antibiotic ointment under the total contact cast to the left lower extremity. He has been using Aquacel Ag with antibiotic ointment to the right plantar foot. She has no issues or complaints today. 3/6; patient presents for follow-up. She did not obtain her gentamicin ointment. She has been using Aquacel Ag to the right plantar foot wound. We have been using Hydrofera Blue with antibiotic ointment under the total contact cast to the left lower extremity. She has no issues or  complaints today. 3/12; patient presents for follow-up. She has been using gentamicin ointment and Aquacel Ag to the right plantar foot wound. We have been using Hydrofera Blue with antibiotic ointment under the total contact cast on the left lower extremity foot wound. She has no issues or complaints today. 3/20; patient presents for follow-up. She has been using gentamicin ointment and Aquacel Ag to the right plantar foot wound. We have been using Hydrofera Blue with antibiotic ointment under the total contact cast to the left lower extremity wound. She has no issues or complaints today. 3/27; patient presents for follow-up. We have been using antibiotic ointment and Aquacel Ag to the right plantar foot wound. We have been using Hydrofera Blue with antibiotic ointment under the total contact cast to the left lower extremity. Both wounds are smaller. 01-24-2023 upon evaluation today patient actually appears to be doing better compared to last time I saw her. It has been several weeks since I last saw her but nonetheless I think the total contact casting is doing [name] good job. Fortunately I do not see any evidence of infection or worsening overall which is great news and in general I do believe that she is moving in the appropriate direction. It has been [name] very long road but nonetheless I feel like she is nearing the end at least in regard to the left foot and even the right foot looks much better to be perfectly honest. 01-31-2023 upon evaluation today patient appears to be doing well currently in regard to her wounds. She has been require some sharp debridement which I think is probably bone both sides to remove [name] lot of callus that has built up at this point.  I discussed that with her today. Also think total contact cast is doing well for the left foot Emina continue that as well. 4/17; patient presents for follow-up. We have been doing Hydrofera Blue with antibiotic ointment under the total contact cast  to the left lower extremity and Aquacel Ag with antibiotic ointment to the right foot wound. She has been using her offloading heel shoe here. 4/24; patient presents for follow-up. We have been doing Hydrofera Blue with antibiotic ointment under the total contact cast to the left lower extremity and Aquacel Ag with antibiotic ointment to the right foot wound. She has been using her offloading heel shoe here. Wounds are smaller with slough accumulation. 5/1; patient presents for follow-up. We have been doing Hydrofera Blue with antibiotic ointment under the total contact cast to the left lower extremity and Aquacel Ag with antibiotic ointment to the right foot wound. Left foot wound is stable and right foot wound appears smaller. Electronic Signature(s) Signed: 02/22/2023 12:46:15 PM By: Geralyn Corwin DO Entered By: Geralyn Corwin on 02/22/2023 11:52:18 -------------------------------------------------------------------------------- Physical Exam Details Patient Name: Date of Service: [name], [name] 02/22/2023 10:45 [name]: [id number] Patient Account Number: [id number] Date of Birth/Sex: Treating RN: 01-22-1976 (47 y.o. Skip Mayer Primary Care Provider: [name] Other Clinician: [name] Referring Provider: Treating Provider/Extender: Chapman Moss in Treatment: [address], Juliannah [name] ([id number]) [id number] Page 9 of 21 Constitutional . Cardiovascular . Psychiatric . Notes Right foot: T the plantar heel there is an incision site with increased depth, No probing to bone. T the opening there is Nonviable tissue and granulation tissue. o o Left foot: T the medial aspect there is an open wound with granulation tissue, Callus and slough build up. No overt signs of infection to any of the wound o beds. Electronic Signature(s) Signed: 02/22/2023 12:46:15 PM By: Geralyn Corwin DO Entered By: Geralyn Corwin on 02/22/2023 11:52:38 -------------------------------------------------------------------------------- Physician Orders Details Patient Name: Date of Service: [name] 02/22/2023 10:45 [name]: [id number] Patient Account Number: [id number] Date of Birth/Sex: Treating RN: [birth year] (47 y.o. Skip Mayer Primary Care Provider: [name] Other Clinician: [name] Referring Provider: Treating Provider/Extender: Chapman Moss in Treatment: 73 Verbal / Phone Orders: Yes Clinician: Huel Coventry Read Back and Verified: Yes Diagnosis Coding Follow-up Appointments Return Appointment in 1 week. Nurse Visit as needed Bathing/ Shower/ Hygiene May shower with wound dressing protected with water repellent cover or cast protector. No tub bath. Anesthetic (Use 'Patient Medications' Section for Anesthetic Order Entry) Lidocaine applied to wound bed Edema Control - Lymphedema / Segmental Compressive Device / Other Elevate, Exercise Daily and [name] void Standing for Long Periods of Time. Elevate legs to the level of the heart and pump ankles as often as possible Elevate leg(s) parallel to the floor when sitting. Off-Loading Total Contact Cast to Left Lower Extremity Open toe surgical shoe - Heel off loader - Right foot Other: - keep pressure off of feet. Additional Orders / Instructions Follow Nutritious Diet and Increase Protein Intake Other: - Stay off of foot, wheelchair only Wound Treatment Wound #11 - Foot Wound Laterality: Plantar, Right Cleanser: Wound Cleanser 1 x Per Day/30 Days Discharge Instructions: Wash your hands with soap and water. Remove old dressing, discard into plastic bag and place into trash. Cleanse the wound with Wound Cleanser prior to applying [name] clean dressing using gauze sponges, not tissues or cotton balls. Do not scrub  or use excessive force. Pat dry using gauze sponges, not tissue or cotton  balls. Topical: Gentamicin 1 x Per Day/30 Days Discharge Instructions: Apply as directed by provider. in clinic Prim Dressing: AquacelAg Advantage Dressing, 2X2 (in/in) 1 x Per Day/30 Days ary Discharge Instructions: Apply to wound as directed Secondary Dressing: ABD Pad 5x9 (in/in) (Generic) 1 x Per Day/30 Days Discharge Instructions: Cover with ABD pad Secured With: Medipore T - 84M Medipore H Soft Cloth Surgical T ape ape, 2x2 (in/yd) 1 x Per Day/30 Days Secured With: [employer] Sterile or Non-Sterile 6-ply 4.5x4 (yd/yd) 1 x Per Day/30 Days [name], [name] ([id number]) [id number] Page 10 of 21 Discharge Instructions: Apply Kerlix as directed Wound #12 - Foot Wound Laterality: Plantar, Left, Medial Cleanser: Wound Cleanser 1 x Per Week/30 Days Discharge Instructions: Wash your hands with soap and water. Remove old dressing, discard into plastic bag and place into trash. Cleanse the wound with Wound Cleanser prior to applying [name] clean dressing using gauze sponges, not tissues or cotton balls. Do not scrub or use excessive force. Pat dry using gauze sponges, not tissue or cotton balls. Prim Dressing: Hydrofera Blue Ready Transfer Foam, 2.5x2.5 (in/in) 1 x Per Week/30 Days ary Discharge Instructions: Apply Hydrofera Blue Ready to wound bed as directed Secondary Dressing: ABD Pad 5x9 (in/in) (Generic) 1 x Per Week/30 Days Discharge Instructions: Cover with ABD pad Secured With: Medipore T - 84M Medipore H Soft Cloth Surgical T ape ape, 2x2 (in/yd) 1 x Per Week/30 Days Secured With: [employer] or Non-Sterile 6-ply 4.5x4 (yd/yd) 1 x Per Week/30 Days Discharge Instructions: Apply Kerlix as directed Electronic Signature(s) Signed: 02/23/2023 11:50:02 AM By: Geralyn Corwin DO Signed: 02/23/2023 4:58:56 PM By: Angelina Pih Previous Signature: 02/22/2023 12:46:15 PM Version By: Geralyn Corwin DO Entered By: Angelina Pih on 02/23/2023  07:50:15 -------------------------------------------------------------------------------- Problem List Details Patient Name: Date of Service: [name] 02/22/2023 10:45 [name]: [id number] Patient Account Number: [id number] Date of Birth/Sex: Treating RN: May 12, 1976 (47 y.o. 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 03/16/2022 No Yes E11.621 Type 2 diabetes mellitus with foot ulcer 03/16/2022 No Yes E11.42 Type 2 diabetes mellitus with diabetic polyneuropathy 03/16/2022 No Yes L97.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 03/16/2022 No Yes of skin Z79.4 Long term (current) use of insulin 02/22/2023 No Yes Inactive Problems Resolved Problems [name], [name] ([id number]) [id number] Page 11 of 21 Electronic Signature(s) Signed: 02/22/2023 3:04:48 PM By: Geralyn Corwin DO Previous Signature: 02/22/2023 12:46:15 PM Version By: Geralyn Corwin DO Entered By: Geralyn Corwin on 02/22/2023 14:51:14 -------------------------------------------------------------------------------- Progress Note Details Patient Name: Date of Service: [name] 02/22/2023 10:45 [name]: [id number] Patient Account Number: [id number] Date of Birth/Sex: Treating RN: 09-11-1976 (47 y.o. Skip Mayer Primary Care Provider: [name] Other Clinician: [name] Referring Provider: Treating Provider/Extender: Chapman Moss in Treatment:  48 Subjective Chief Complaint Information obtained from Patient 03/19/2021; patient referred by Dr. Excell Seltzer who has been looking after her left foot for quite [name] period of time for review of [name] nonhealing area in the  left midfoot 03/12/2022; bilateral feet wounds and right lower extremity wound. History of Present Illness (HPI) 01/18/18-She is here for initial evaluation of the left great toe ulcer. She is [name] poor historian in regards to timeframe in detail. She states approximately 4 weeks ago she lacerated her toe on something in the house. She followed up with her primary care who placed her on Bactrim and ultimately [name] second dose of Bactrim prior to coming to wound clinic. She states she has been treating the toe with peroxide, Betadine and [name] Band-Aid. She did not check her blood sugar this morning but checked it yesterday morning it was 327; she is unaware of [name] recent A1c and there are no current records. She saw Dr. she would've orthopedics last week for an old injury to the left ankle, she states he did not see her toe, nor did she bring it to his attention. She smokes approximately 1 pack cigarettes [name] day. Her social situation is concerning, she arrives this morning with her mother who appears extremely intoxicated/under the influence; her mother was asked to leave the room and be monitored by the patient's grandmother. The patient's aunt then accompanied the patient and the room throughout the rest of the appointment. We had [name] lengthy discussion regarding the deleterious effects of uncontrolled hyperglycemia and smoking as it relates to wound healing and overall health. She was strongly encouraged to decrease her smoking and get her diabetes under better control. She states she is currently on [name] diet and has cut down her [facility] consumption. The left toe is erythematous, macerated and slightly edematous with malodor present. The edema in her left foot is below her baseline, there is no  erythema streaking. We will treat her with Santyl, doxycycline; we have ordered and xray, culture and provided [name] Peg assist surgical shoe and cultured the wound. 01/25/18-She is here in follow-up evaluation for [name] left great toe ulcer and presents with an abscess to her suprapubic area. She states her blood sugars remain elevated, feeling "sick" and if levels are below 250, but she is trying. She has made no attempt to decrease her smoking stating that we "can't take away her food in her cigarettes". She has been compliant with offloading using the PEG assist you. She is using Santyl daily. the culture obtained last week grew staph aureus and Enterococcus faecalis; continues on the doxycycline and Augmentin was added on Monday. The suprapubic area has erythema, no femoral variation, purple discoloration, minimal induration, was accessed with [name] cotton tip applicator with sanguinopurulent drainage, this was cultured, I suspect the current antibiotic treatment will cover and we will not add anything to her current treatment plan. She was advised to go to urgent care or ER with any change in redness, induration or fever. 02/01/18-She is here in follow-up evaluation for left great toe ulcers and [name] new abdominal abscess from last week. She was able to use packing until earlier this week, where she "forgot it was there". She states she was feeling ill with GI symptoms last week and was not taking her antibiotic. She states her glucose levels have been predominantly less than 200, with occasional levels between 200-250. She thinks this was contributing to her GI symptoms as they have resolved without intervention. There continues to be significant laceration to left toe, otherwise it clinically looks stable/improved. There is now less superficial opening to the lateral aspect of the great toe that was residual blister. We will transition to [facility] to all  wounds, she will continue her Augmentin. If there is  no change or deterioration next week for reculture. 02/08/18-She is here in follow-up evaluation for left great toe ulcer and abdominal ulcer. There is an improvement in both wounds. She has been wrapping her left toe with coban, not by our direction, which has created an area of discoloration to the medial aspect; she has been advised to NOT use coban secondary to her neuropathy. She states her glucose levels have been high over this last week ranging from 200-350, she continues to smoke. She admits to being less compliant with her offloading shoe. We will continue with same treatment plan and she will follow-up next week. 02/15/18-She is here in follow-up evaluation for left great toe ulcer and abdominal ulcer. The abdominal ulcer is epithelialized. The left great toe ulcer is improved and all injury from last week using the Coban wrap is resolved, the lateral ulcer is healed. She admits to noncompliance with wearing offloading shoe and admits to glucose levels being greater than 300 most of the week. She continues to smoke and expresses no desire to quit. There is one area medially that probes deeper than it has historically, erythema to the toe and dorsal foot has consistently waxed and waned. There is no overt signs of cellulitis or infection but we will culture the wound for any occult infection given the new area of depth and erythema. We will hold off on sensitivities for initiation of antibiotic therapy. 02/22/18-She is here in follow up evaluation for left great toe ulcer. There is overall significant improvement in both wound appearance, erythema and edema with changes made last week. She was not initiated on antibiotic therapy. Culture obtained last week showed oxacillin sensitive staph aureus, sensitive to clindamycin. Clindamycin has been called into the pharmacy but she has been instructed to hold off on initiation secondary to overall clinical improvement and her history of antibiotic  intolerance. She has been instructed to contact the clinic with any noted changes/deterioration and the wound, erythema, edema and/or pain. She will follow-up next week. She continues to smoke and her glucose levels remain elevated >250; she admits to compliance with offloading shoe 03/01/18 on evaluation today patient appears to be doing fairly well in regard to her left first toe ulcer. She has been tolerating the dressing changes with the [facility] Dressing without complication and overall this has definitely showed signs of improvement according to records as well is what the patient tells me today. I'm very pleased in that regard. She is having no pain today 03/08/18 She is here for follow up evaluation of [name] left great toe ulcer. She remains non-compliant with glucose control and smoking cessation; glucose levels consistently >200. She states that she got new shoe inserts/peg assist. She admits to compliance with offloading. Since my last evaluation there is significant improvement. We will switch to prisma at this time and she will follow up next week. She is noted to be tachycardic at this appointment, heart rate 120s; she has [name] history of heart rate 70-130 according to our records. She admits to extreme agitation r/t personal issues; she was advised to monitor her heartrate and contact her physician if it does not return to [name] more normal range (<100). She takes cardizem twice daily. 03/15/18-She is here in follow-up evaluation for left great toe ulcer. She remains noncompliant with glucose control and smoking cessation. She admits to compliance with wearing offloading shoe. The ulcer is improved/stable and we will continue with the  same treatment plan and she will follow-up next week 03/22/18-She is here for evaluation for left great toe ulcer. There continues to be significant improvement despite recurrent hyperglycemia (over 500 yesterday) and she continues to smoke. She has been compliant  with offloading and we will continue with same treatment plan and she will follow-up next week. 03/29/18-She is here for evaluation for left great toe ulcer. Despite continuing to smoke and uncontrolled diabetes she continues to improve. She is compliant with offloading shoe. We will continue with the same treatment plan and she will follow-up next week 04/05/18- She is here in follow up evaluation for [name] left great toe ulcer; she presents with small pustule to left fifth toe (resembles ant bite). She admits to compliance with wearing offloading shoe; continues to smoke or have uncontrolled blood glucose control. There is more callus than usual with evidence of bleeding; she denies known trauma. 04/12/18-She is here for evaluation of left great toe ulcer. Despite noncompliance with glycemic control and smoking she continues to make improvement. She continues to wear offloading shoe. The pustule, that was identified last week, to the left fifth toe is resolved. She will follow-up in 2 weeks 05/03/18-she is seen in follow-up evaluation for [name] left great toe ulcer. She is compliant with offloading, otherwise noncompliant with glycemic control and Stigler, Ilissa [name] ([id number]) [phone].pdf Page 12 of 21 smoking. She has plateaued and there is minimal improvement noted. We will transition to [facility], replaced the insert to her surgical shoe and she will follow-up in one week 05/10/18- She is here in follow up evaluation for [name] left great toe ulcer. It appears stable despite measurement change. We will continue with same treatment plan and follow up next week. 05/24/18-She is seen in follow-up evaluation for [name] left great toe ulcer. She remains compliant with offloading, has made significant improvement in her diet, decreasing the amount of sugar/soda. She said her recent A1c was 10.9 which is lower than. She did see [name] diabetic nutritionist/educator yesterday. She continues to smoke.  We will continue with the same treatment plan and she'll follow-up next week. 05/31/18- She is seen in follow-up evaluation for left great toe ulcer. She continues to remain compliant with offloading, continues to make improvement in her diet, increasing her water and decreasing the amount of sugar/soda. She does continue to smoke with no desire to quit. We will apply Prisma to the depth and Hydrofera Blue over. We have not received insurance authorization for oasis. She will follow up next week. 06/07/18-She is seen in follow-up evaluation for left great toe ulcer. It has stalled according to today's measurements although base appears stable. She says she saw [name] diabetic educator yesterday; her average blood sugars are less than 300 which is an improvement for her. She continues to smoke and states "that's my next step" She continues with water over soda. We will order for xray, culture and reinstate ace wrap compression prior to placing apligraf for next week. She is voicing no complaints or concerns. Her dressing will change to iodoflex over the next week in preparation for apligraf. 06/14/18-She is seen in follow-up evaluation for left great toe ulcer. Plain film x-ray performed last week was negative for osteomyelitis. Wound culture obtained last week grew strep B and OSSA; she is initiated on keflex and cefdinir today; there is erythema to the toe which could be from ace wrap compression, she has [name] history of wrapping too tight and has has been encouraged to maintain ace wraps  that we place today. We will hold off on application of apligraf today, will apply next week after antibiotic therapy has been initiated. She admits today that she has resumed taking [name] shower with her foot/toe submerged in water, she has been reminded to keep foot/toe out of the bath water. She will be seen in follow up next week 06/21/18-she is seen in follow-up evaluation for left great toe ulcer. She is tolerating antibiotic  therapy with no GI disturbance. The wound is stable. Apligraf was applied today. She has been decreasing her smoking, only had 4 cigarettes yesterday and 1 today. She continues being more compliant in diabetic diet. She will follow-up next week for evaluation of site, if stable will remove at 2 weeks. 06/28/18- She is here in follow up evalution. Apligraf was placed last week, she states the dressing fell off on Tuesday and she was dressing with hydrofera blue. She is healed and will be discharged from the clinic today. She has been instructed to continue with smoking cessation, continue monitoring glucose levels, offloading for an additional 4 weeks and continue with hydrofera blue for additional two weeks for any possible microscopic opening. Readmission: 08/07/18 on evaluation today patient presents for reevaluation concerning the ulcer of her right great toe. She was previously discharged on 06/28/18 healed. Nonetheless she states that this began to show signs of drainage she subsequently went to her primary care provider. Subsequently an x-ray was performed on 08/01/18 which was negative. The patient was also placed on antibiotics at that time. Fortunately they should have been effective for the infection. Nonetheless she's been experiencing some improvement but still has [name] lot of drainage coming from the wound itself. 08/14/18 on evaluation today patient's wound actually does show signs of improvement in regard to the erythema at this point. She has completed the antibiotics. With that being said we did discuss the possibility of placing her in [name] total contact cast as of today although I think that I may want to give this just [name] little bit more time to ensure nothing recurrence as far as her infection is concerned. I do not want to put in the cast and risk infection at that time if things are not completely resolved. With that being said she is gonna require some debridement today. 08/21/18 on  evaluation today patient actually appears to be doing okay in regard to her toe ulcer. She's been tolerating the dressing changes without complication. With that being said it does appear that she is ready and in fact I think it's appropriate for [place] to go ahead and initiate the total contact cast today. Nonetheless she will require some sharp debridement to prepare the wound for application. Overall I feel like things have been progressing well but we do need to do something to get this to close more readily. 08/24/18 patient seen today for reevaluation after having had the total contact cast applied on Tuesday. She seems to have done very well the wound appears to be doing great and overall I'm pleased with the progress that she's made. There were no abnormal areas of rubbing from the cast on her lower extremity. 08/30/18 on evaluation today patient actually appears to be completely healed in regard to her plantar toe ulcer. She tells me at this point she's been having [name] lot of issues with the cast. She almost fell [name] couple of times the state shall the step of her dog [name] couple times as well. This is been [name] very frustrating process  for her other nonetheless she has completely healed the wound which is excellent news. Overall there does not appear to be the evidence of infection at this time which is great news. 09/11/18 evaluation today patient presents for follow-up concerning her great toe ulcer on the left which has unfortunately reopened since I last saw her which was only [name] couple of weeks ago. Unfortunately she was not able to get in to get the shoe and potentially the AFO that's gonna be necessary due to her left foot drop. She continues with offloading shoe but this is not enough to prevent her from reopening it appears. When we last had her in the total contact cast she did well from [name] healing standpoint but unfortunately the wound reopened as soon as she came out of the cast within just [name] couple  of weeks. 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 This is also part of the reason why she falls on [name] regular basis. I just do not believe that is gonna be safe for [place] to reinitiate the total contact cast as last time we had this on she fell 3 times one week which is definitely not normal for her. 09/18/18 upon evaluation today the patient actually appears to be doing about the same in regard to her toe ulcer. She did not contact [employer] as I asked her to even though I had given her the prescription. In fact she actually states that she has no idea where the prescription is. She did apparently call [employer] and they told her that all she needed to do was bring the prescription in order to be able to be seen and work on getting the AFO for her left foot. With all that being said she still does not have an appointment and I'm not sure were things stand that regard. I will give her [name] new prescription today in order to contact them to get this set up. 09/25/18 on evaluation today patient actually appears to be doing about the same in regard to her toes ulcer. She does have [name] small areas which seems to have [name] lot of callous buildup around the edge of the wound which is going to need sharp debridement today. She still is waiting to be scheduled for evaluation with [employer] for possibility of an AFO. She states there supposed to call her tomorrow to get this set up. Unfortunately it does appear that her foot specifically the toe area is showing signs of erythema. There does not appear to be any systemic infection which is in these good news. 10/02/18 on evaluation today patient actually appears to be doing about the same in regard to her toe ulcer. This really has not done too well although it's not significantly  larger it's also not significantly smaller. She has been tolerating the dressing changes without complication. She actually has her appointment with [employer] and [facility] tomorrow to hopefully be measured for obtaining and AFO splint. I think this would be helpful preventing this from reoccurring. We had contemplated starting the cast this week although to be honest I am reluctant to do that as she's been having nausea, vomiting, and seizure activity over the past three days. She has [name] history of seizures and have been told is nothing that can  be done for these. With that being said I do believe that along with the seizures have the nausea vomiting which upon further questioning doesn't seem to be the normal for her and makes me concerned for the possibility of infection or something else going on. I discussed this with the patient and her mother during the office visit today. I do not feel the wound is effective but maybe something else. The responses this was "this just happens to her at times and we don't know why". They did not seem to be interested in going to the hospital to have this checked out further. 10/09/18 on evaluation today patient presents for follow-up concerning her ongoing toe ulcer. She has been tolerating the dressing changes without complication. Fortunately there does not appear to be any evidence of infection which is great news however I do think that the patient would benefit from going ahead for with the total contact cast. She's actually in [name] wheelchair today she tells me that she will use her walker if we initiate the cast. I was very specific about the fact that if we were gonna do the cast I wanted to make sure that she was using the walker in order to prevent any falls. She tells me she does not have stairs that she has to traverse on [name] regular basis at her home. She has not had any seizures since last week again that something that happens to her often she tells me she  did talk to [employer] and they said that it may take up to three weeks to get the brace approved for her. Hopefully that will not take that long but nonetheless in the meantime I do think the cast could be of benefit. 10/12/18 on evaluation today patient appears to be doing rather well in regard to her toe ulcer. It's just been [name] few days and already this is significantly improved both as far as overall appearance and size. Fortunately there's no sign of infection. She is here for her first obligatory cast change. [name], [name] ([id number]) [id number] Page 13 of 21 10/19/18 Seen today for follow up and management of left great toe ulcer. Wound continues to show improvement. Noted small open area with seroussang drainage with palpation. Denies any increased pain or recent fevers during visit. She will continue calcium alginate with offloading shoe. Denies any questions or concerns during visit. 10/26/18 on evaluation today patient appears to be doing about the same as when I last saw her in regard to her wound bed. Fortunately there does not appear to be any signs of infection. Unfortunately she continues to have [name] breakdown in regard to the toe region any time that she is not in the cast. It takes almost no time at all for this to happen. Nonetheless she still has not heard anything from the brace being made by [employer] as to when exactly this will be available to her. Fortunately there is no signs of infection at this time. 10/30/18 on evaluation today patient presents for application of the total contact cast as we just received him this morning. Fortunately we are gonna be able to apply this to her today which is great news. She continues to have no significant pain which is good news. Overall I do feel like things have been improving while she was the cast is when she doesn't have [name] cast that things get worse. She still has not really heard anything from [employer] regarding  her brace. 11/02/18 upon evaluation  today patient's wound already appears to be doing significantly better which is good news. Fortunately there does not appear to be any signs of infection also good news. Overall I do think the total contact cast as before is helping to heal this area unfortunately it's just not gonna likely keep the area closed and healed without her getting her brace at least. Again the foot drop is [name] significant issue for her. 11/09/18 on evaluation today patient appears to be doing excellent in regard to her toe ulcer which in fact is completely healed. Fortunately we finally got the situation squared away with the paperwork which was needed to proceed with getting her brace approved by Medicaid. I have filled that out unfortunately that information has been sent to the orthopedic office that I worked at 2 1/2 years ago and not tired Current wound care measures. Fortunately she seems to be doing very well at this time. 11/23/18 on evaluation today patient appears to be doing More Poorly Compared to Last Time I Saw Her. At [facility] She Had Completely Healed. Currently she is continuing to have issues with reopening. She states that she just found out that the brace was approved through Medicaid now she just has to go get measured in order to have this fitted for her and then made. Subsequently she does not have an appointment for this yet that is going to complicate things we obviously cannot put her back in the cast if we do not have everything measured because they're not gonna be able to measure her foot while she is in the cast. 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 Obviously this is unfortunate and does have me somewhat worried as well. 11/30/18 on evaluation today patient's toe ulcer actually appears to be doing fairly well. The good news is she will be getting her brace in  the shoes next week on Wednesday. Hopefully we will be able to get this to heal without having to go back in the cast however she may need the cast in order to get the wound completely heal and then go from there. Fortunately there's no signs of infection at this time. 12/07/18 on evaluation today patient fortunately did receive her brace and she states she could tell this definitely makes her walk better. With that being said she's been having issues with her toe where she noticed yesterday there was [name] lot of tissue that was loosing off this appears to be much larger than what it was previous. She also states that her leg has been read putting much across the top of her foot just about the ankle although this seems to be receiving somewhat. The total area is still red and appears to be someone infected as best I can tell. She is previously taken Bactrim and that may be [name] good option for her today as well. We are gonna see what I wound culture shows as well and I think that this is definitely appropriate. With that being said outside of the culture I still need to initiate something in the interim and that's what I'm gonna go ahead and select Bactrim is [name] good option for her. 12/14/18 on evaluation today patient appears to be doing better in regard to her left great toe ulcer as compared to last week's evaluation. There's still some erythema although this is significantly improved which is excellent news.  Overall I do believe that she is making good progress is still gonna take some time before she is where I would like her to be from the standpoint of being able to place her back into the total contact cast. Hopefully we will be where we need to be by next week. 12/21/18 on evaluation today patient actually appears to be doing poorly in regard to her toe ulcer. She's been tolerating the dressing changes without complication. Fortunately there's no signs of systemic infection although she does have [name] lot  of drainage from the toe ulcer and this does seem to be causing some issues at this point. She does have erythema on the distal portion of her toe that appears to be likely cellulitis. 12/28/18 on evaluation today patient actually appears to be doing [name] little better in my pinion in regard to her toe ulcer. With that being said she still does have some evidence of infection at this time and for her culture she had both E. coli as well as enterococcus as organisms noted on evaluation. For that reason I think that though the Keflex likely has treated the E. coli rather well this has really done nothing for the enterococcus. We are going to have to initiate treatment for this specifically. 01/04/19 on evaluation today patient's toe actually appears to be doing better from the standpoint of infection. She currently would like to see about putting the cash back on I think that this is appropriate as long as she takes care of it and keeps it from getting wet. She is gonna have some drainage we can definitely pass this up with Drawtex and alginate to try to prevent as much drainage as possible from causing the problems. With that being said I do want to at least try her with the cast between now and Tuesday. If there any issues we can't continue to use it then I will discontinue the use of the cast at that point. 01/08/19 on evaluation today patient actually appears to be doing very well as far as her foot ulcer specifically the great toe on the left is concerned. She did have an area of rubbing on the medial aspect of her left ankle which again is from the cast. Fortunately there's no signs of infection at this point in this appears to be [name] very slight skin breakdown. The patient tells me she felt it rubbing but didn't think it was that bad. Fortunately there is no signs of active infection at this time which is good news. No fevers, chills, nausea, or vomiting noted at this time. 01/15/19 on evaluation today  patient actually appears to be doing well in regard to her toe ulcer. Again as previous she seems to do well and she has the cast on which indicates to me that during the time she doesn't have [name] cast on she's putting way too much pressure on this region. Obviously I think that's gonna be an issue as with the current national emergency concerning the Covid-19 Virus it has been recommended that we discontinue the use of total contact casting by the chief medical officer of our company, Dr. Maurine Minister. The reasoning is that if [name] patient becomes sick and cannot come into have the cast removed they could not just leave this on for an additional two weeks. Obviously the hospitals also do not want to receive patient's who are sick into the emergency department to potentially contaminate the region and spread the Covid-19 Virus among other sick individuals  within the hospital system. Therefore at this point we are suspending the use of total contact cast until the current emergency subsides. This was all discussed with the patient today as well. 01/22/19 on evaluation today patient's wound on her left great toe appears to be doing slightly worse than previously noted last week. She tells me that she has been on this quite [name] bit in fact she tells me she's been awake for 38 straight hours. This is due to the fact that she's having to care for grandparents because nobody else will. She has been taking care of them for five the last seven days since I've seen her they both have dementia his is from [name] stroke and her grandmother's was progressive. Nonetheless she states even her mom who knows her condition and situation has only help two of those days to take care of them she's been taking care of the rest. Fortunately there does not appear to be any signs of active infection in regard to her toe at this point although obviously it doesn't look as good as it did previous. I think this is directly related to her not taking  off the pressure and friction by way of taking things easy. Though I completely understand what's going on. 01/29/19 on evaluation today patient's tools are actually appears to be showing some signs of improvement today compared to last week's evaluation as far as not necessarily the overall size of the wound but the fact that she has some new skin growth in between the two ends of the wound opening. Overall I feel like she has done well she states that she had [name] family member give her what sounds to be [name] CAM walker boot which has been helpful as well. 02/05/19 on evaluation today patient's wound bed actually appears to be doing significantly better in regard to her overall appearance of the size of the wound. With that being said she is still having an issue with offloading efficiently enough to get this to close. Apparently there is some signs of infection at this point as well unfortunately. Previously she's done well of Augmentin I really do not see anything that needs to be culture currently but there theme and cellulitis of the foot that I'm seeing I'm gonna go ahead and place her on an antibiotic today to try to help clear this up. 02/12/2019 on evaluation today patient actually appears to be doing poorly in regard to her overall wound status. She tells me she has been using her offloading shoe but actually comes in today wearing her tennis shoe with the AFO brace. Again as I previously discussed with her this is really not sufficient to allow the area to heal appropriately. Nonetheless she continues to be somewhat noncompliant and I do wonder based on what she has told my nurse in the past as to [name], [name] ([id number]) [id number] Page 14 of 21 whether or not [name] good portion of this noncompliance may be recreational drug and alcohol related. She has had [name] history of heroin overdose and this was fairly recently in the past couple of months that have been seeing her.  Nonetheless overall I feel like her wound looks significantly worse today compared to what it was previous. She still has significant erythema despite the Augmentin I am not sure that this is an appropriate medication for her infection I am also concerned that the infection may have gone down into her bone. 02/19/19 on evaluation today patient actually appears to  be doing about the same in regard to her toe ulcer. Unfortunately she continues to show signs of bone exposure and infection at this point. There does not appear to be any evidence of worsening of the infection but I'm also not really sure that it's getting significantly better. She is on the Augmentin which should be sufficient for the Staphylococcus aureus infection that she has at this point. With that being said she may need IV antibiotics to more appropriately treat this. We did have [name] discussion today about hyperbaric option therapy. 02/28/19 on evaluation today patient actually appears to be doing much worse in regard to the wound on her left great toe as compared to even my previous evaluation last week. Unfortunately this seems to be training in [name] pretty poor direction. Her toe was actually now starting to angle laterally and I can actually see the entire joint area of the proximal portion of the digit where is the distal portion of the digit again is no longer even in contact with the joint line. Unfortunately there's [name] lot more necrotic tissue around the edge and the toe appears to be showing signs of becoming gangrenous in my pinion. I'm very concerned about were things stand at this point. She did see infectious disease and they are planning to send in [name] prescription for Sivextro for her and apparently this has been approved. With that being said I don't think she should avoid taking this but at the same time I'm not sure that it's gonna be sufficient to save her toe at this point. She tells me that she still having to care for  grandparents which I think is putting quite [name] bit of strain on her foot and specifically the total area and has caused this to break down even to [name] greater degree than would've otherwise been expected. 03/05/19 on evaluation today patient actually appears to be doing quite well in regard to her toe all things considering. She still has bone exposed but there appears to be much less your thing on overall the appearance of the wound and the toe itself is dramatically improved. She still does have some issues currently obviously with infection she did see vascular as well and there concerned that her blood flow to the toad. For that reason they are setting up for an angiogram next week. 03/14/19 on evaluation today patient appears to be doing very poor in regard to her toe and specifically in regard to the ulceration and the fact that she's starting to notice the toe was leaning even more towards the lateral aspect and the complete joint is visible on the proximal aspect of the joint. Nonetheless she's also noted [name] significant odor and the tip of the toe is turning more dark and necrotic appearing. Overall I think she is getting worse not better as far as this is concerned. For that reason I am recommending at this point that she likely needs to be seen for likely amputation. READMISSION 03/19/2021 This is [name] patient that we cared for in this clinic for [name] prolonged period of time in 2019 and 2020 with [name] left foot and left first toe wound. 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 Excell Seltzer. In December 2021 she had an ulcer on her right great toe as well as the fourth and fifth toes. She underwent [name] partial ray amputation of the right  fourth and fifth toes. She also had an angiogram at that time and underwent angioplasty of the right anterior tibial artery. In any case she claims that the  wound on the right foot is closed I did not look at this today which was probably an oversight although I think that should be done next week. 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 According to Dr. Bernette Redbird last review that she was out of the area being cared for by another physician but recently came back to his attention. 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 [name] culture of this area showed E. coli apparently before she came back to see Dr. Excell Seltzer she was supposed to be receiving antibiotics but she did not really take them. Nor is she offloading this area at all. Finally her last hemoglobin A1c listed in epic was in March 2022 at 14.1 she says things are [name] lot better since then although I am not sure. 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 She had this wound at the time. During this admission she had E. coli sepsis [name] TEE was negative. 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 She is still [name] heavy cigarette smoker. She has [name] history of bipolar disorder seizure disorder ABI in our clinic was 1.05 6/1; left midfoot in the setting of [name] TMA done previously. Round circular wound with [name] "knuckle" of protruding tissue. The problem is that the knuckle was not attached to any of the surrounding granulation and this probed proximally widely I removed [name] large portion of this tissue. This wound goes with considerable undermining laterally. I do not feel any bone there was no purulence but this is [name] deep wound. 6/8; in spite of the debridement I did last week. She arrives with [name] wound looking exactly the  same. [name] protruding "knuckle" of tissue nonadherent to most of the surrounding tissue. There is considerable depth around this from 6-12 o'clock at 2.7 cm and undermining of 1 cm. This does not look overtly infected and the x- ray I did last week was negative for any osseous abnormalities. We have been using silver collagen 6/15; deep tissue culture I did last week showed moderate staph aureus and moderate Pseudomonas. This will definitely require prolonged antibiotic therapy. The pathology on the protuberant area was negative for malignancy fungus etc. 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 We have been using silver collagen. I am going to be prescribing Levaquin for 2 weeks. Her CT scan of the foot is down for 7/5 6/22; CT scan of the foot on 7 5. She says she has hardware in the left leg from her previous fracture. She is on the Levaquin for the deep tissue culture I did that showed methicillin sensitive staph aureus and Pseudomonas. I gave her [name] 2-week supply and she will have another week. She arrives in clinic today with the same protuberant tissue however this is nonadherent to the tissue surrounding it. I am really at [name] loss to explain this unless there is underlying deep tissue infection 6/29; patient presents for 1 week follow-up. She has been using collagen to  the wound bed. She reports taking her antibiotics as prescribed.She has no complaints or issues today. She denies signs of infection. 7/6; patient presents for one week followup. She has been using collagen to the wound bed. She states she is taking Levaquin however at times she is not able to keep it down. She denies signs of infection. 7/13; patient presents for 1 week follow-up. She has been using silver alginate to the wound bed. She still has nausea when taking her antibiotics. She denies signs of infection. 7/20; patient presents for 1 week follow-up. She has been  using silver alginate with gentamicin cream to the wound bed. She denies any issues and has no complaints today. She denies signs of infection. 7/27; patient presents for 1 week follow-up. She continues to use silver alginate with gentamicin cream to the wound bed. She reports starting her antibiotics. She has no issues or complaints. Overall she reports stability to the wound. 8/3; patient presents for 1 week follow-up. She has been using silver alginate with gentamicin cream to the wound bed. She reports completing all antibiotics. She has no issues or complaints today. She denies signs of infection. 8/17; patient presents for 2-week follow-up. He is to use silver alginate to the wound bed. She has no issues or complaints today. She denies signs of infection. She reports her pain has improved in her foot since last clinic visit 8/24; patient presents for 1 week follow-up. She continues to use silver alginate to the wound bed. She has no issues or complaints. She denies signs of infection. Pain is stable. 9/7; patient presents for follow-up. She missed her last week appointment due to feeling ill. She continues to use silver alginate. She has [name] new wound to the right lower extremity that is covered in eschar. She states It occurred over the past week and has no idea how it started. She currently denies signs of infection. 9/14; patient presents for follow-up. T the left foot wound she has been using gentamicin cream and silver alginate. T the right lower extremity wound she has o o been keeping this covered and has not obtain Santyl. 9/21; patient presents for follow-up. She reports using gentamicin cream and silver alginate to the left foot and Santyl to the right lower extremity wound. She has no issues or complaints today. She denies signs of infection. 9/28; patient presents for follow-up. She reports [name] new wound to her right heel. She states this occurred [name] few days ago and is progressively  gotten worse. She has been trying to clean the area with [name] Q-tip and Santyl. She reports stability in the other 2 wounds. She has been using gentamicin cream and silver alginate to the left foot and Santyl to the right lower extremity wound. [name], [name] ([id number]) [id number] Page 15 of 21 10/12; patient presents for follow-up. She reports improvement to the wound beds. She is seeing vein and vascular to discuss the potential of [name] left BKA. She states they are going to do an arteriogram. She continues to use silver alginate with dressing changes to her wounds. 11/2; patient presents for follow-up. She states she has not been doing dressing changes to the wound beds. She states she is not able to offload the areas. She reports chronic pain to her left foot wound. 11/9; patient presents for follow-up. She came in with only socks on. She states she forgot to put on shoes. It is unclear if she is doing any dressing changes. She currently denies systemic  signs of infection. 11/16; patient presents for follow-up. She came again only with socks on. She states she does not wear shoes ever. It is unclear if she does dressing changes. She currently denies systemic signs of infection. 11/23; patient presents for follow-up. She wore her shoes today. It still unclear exactly what dressing she is using for each wound but she did states she obtained Dakin's solution and has been using this to the left foot wound. She currently denies signs of infection. 11/30; patient presents for follow-up. She has no issues or complaints today. She currently denies signs of infection. 12/7; patient presents for follow-up. She has no issues or complaints today. She has been using Hydrofera Blue to the right heel wound and Dakin solution to the left foot wound. Her right anterior leg wound is healed. She currently denies signs of infection. 12/14; patient presents for follow-up. She has been using  Hydrofera Blue to the right heel and Dakin's to the left foot wounds. She has no issues or complaints today. She denies signs of infection. 12/21; patient presents for follow-up. She reports using Hydrofera Blue to the right heel and Dakin's to the left foot wound. She denies signs of infection. 12/28; patient presents for follow-up. She continues to use Dakin's to the left foot wound and Hydrofera Blue to the right heel wound. She denies signs of infection. 1/4; patient presents for follow-up. She has no issues or complaints today. She denies signs of infection. 1/11; patient presents for follow-up. It is unclear if she has been dressing these wounds over the past week. She currently denies signs of infection. 1/18; patient presents for follow-up. She states she has been using Dakin's wet-to-dry dressings to the left foot. She has been using Hydrofera Blue to the right foot foot wound. She states that the anterior right leg wound has reopened and draining serous fluid. She denies signs of infection. 1/25; patient presents for follow-up. She has no issues or complaints today. 2/1; patient presents for follow-up. She has no issues or complaints today. She denies signs of infection. 2/8; patient presents for follow-up. She has lost her surgical shoes. She did not have [name] dressing to the right heel wound. She currently denies signs of infection. 2/15; patient presents for follow-up. She reports more pain to the right heel today. She denies purulent drainage Or fever/chills 2/22; patient presents for follow-up. She reports taking clindamycin over the past week. She states that she continues to have pain to her right heel. She reports purulent drainage. Readmission 03/16/2022 Ms. Savonnah Leseberg is [name] 47 year old female with [name] past medical history of type 2 diabetes, osteomyelitis to her feet, chronic systolic heart failure and bipolar disorder that presents to the clinic for bilateral feet wounds and right  lower extremity wound. She was last seen in our clinic on 12/15/2021. At that time she had purulent drainage coming out of her right plantar foot and I recommended she go to the ED. She states she went to [facility] and has been there for the past 3 months. I cannot see the records. She states she had OR debridement and was on several weeks of IV antibiotics while inpatient. Since discharge she has not been taking care of the wound beds. She had nothing on her feet other than socks today. She currently denies signs of infection. 5/31; patient presents for follow-up. She has been using Dakin's wet-to-dry dressings to the wound beds on her feet bilaterally and antibiotic ointment to the right anterior  leg wound. She had [name] wound culture done at last clinic visit that showed moderate Pseudomonas aeruginosa sensitive to ciprofloxacin. She currently denies systemic signs of infection. 6/14; patient presents for follow-up. She received Keystone 5 days ago and has been using this on the wound beds. She states that last week she had to go to the hospital because she had increased warmth and erythema to the right foot. She was started on 2 oral antibiotics. She states she has been taking these. She currently denies systemic signs of infection. She has no issues or complaints today. 6/21; patient presents for follow-up. She states she has been using Keystone antibiotics to the wound beds. She has no issues or complaints today. She denies signs of infection. 6/28; patient presents for follow-up. She has been using Keystone antibiotics to the wound beds. She has no issues or complaints today. 7/12; patient presents for follow-up. Has been using Keystone antibiotics to the wound beds with calcium alginate. She has no issues or complaints today. She never followed up with her orthopedic surgeon who did the OR debridement to the right foot. We discussed the total contact cast for the left foot and  patient would like to do this next week. 7/19; patient presents for follow-up. She has been using Keystone antibiotics with calcium alginate to the wound beds. She has no issues or complaints today. Patient is in agreement to do the total contact cast of the left foot today. She knows to return later this week for the obligatory cast change. 05-13-2022 upon evaluation today patient's wound which she has the cast of the left leg actually appears to be doing significantly better. Fortunately I do not see any signs of active infection locally or systemically which is great news and overall I am extremely pleased with where we stand currently. 7/26; patient presents for follow-up. She has [name] cast in place for the past week. She states it irritated her shin. Other than that she tolerated the cast well. She states she would like [name] break for 1 week from the cast. We have been using Keystone antibiotic and Aquacel to both wound beds. She denies signs of infection. 8/2; patient presents for follow-up. She has been using Keystone and Aquacel to the wound beds. She denies any issues and has no complaints. She is agreeable to have the cast placed today for the left leg. 06-03-2022 upon evaluation today patient appears to be doing well with regard to her wound she saw some signs of improvement which is great news. Fortunately I do not see any evidence of active infection locally or systemically at this time which is great news. No fevers, chills, nausea, vomiting, or diarrhea. 8/16; patient presents for follow-up. She has no issues or complaints today. We have been using Keystone and Aquacel to the wound beds. The left lower extremity is in [name] total contact cast. She is tolerated this well. 8/23; patient presents for follow-up. She has had the total contact cast on the left leg for the past week. Unfortunately this has rubbed and broken down the Vowell, Aubryanna [name] ([id number]) [id number]  Page 16 of 21 skin to the medial foot. She currently denies signs of infection. She has been using Keystone antibiotic to the right plantar foot wound. 8/30; patient presents for follow-up. We have held off on the total contact cast for the left leg for the past week. Her wound on the left foot has improved and the previous surrounding breakdown of skin has epithelialized. She has  been using Keystone antibiotic to both wound beds. She has no issues or complaints today. She denies signs of infection. 9/6; patient presents for follow-up. She has ordered her's Keystone antibiotic and this is arriving this week. She has been doing Dakin's wet-to-dry dressings to the wound beds. She denies signs of infection. She is agreeable to the total contact cast today. 9/13; patient presents for follow-up. She states that the cast caused her left leg shin to be sore. She would like to take [name] break from the cast this week. She has been using Keystone antibiotic to the right plantar foot wound. She denies signs of infection. 9/20; patient presents for follow-up. She has been using Keystone antibiotics to the wound beds with calcium alginate to the right foot wound and Hydrofera Blue to the left foot wound. She is agreeable to having the cast placed today. She has been approved for Apligraf and we will order this for next clinic visit. 9/27; patient presents for follow-up. We have been using Keystone antibiotic with Hydrofera Blue to the left foot wound under [name] total contact cast. T the right o foot wound she has been using Keystone antibiotic and calcium alginate. She declines [name] total contact cast today. Apligraf is available for placement and she would like to proceed with this. 07-28-2022 upon evaluation today patient appears to be doing well currently in regard to her wound. She is actually showing signs of significant improvement which is great news. Fortunately I do not see any evidence of active infection locally  nor systemically at this time. She has been seeing Dr. Mikey Bussing and to be honest has been doing very well with the cast. Subsequently she comes in today with [name] cast on and we did reapply that today as well. She did not really want to she try to talk me out of that but I explained that if she wanted to heal this is really the right way to go. Patient voiced understanding. In regard to her right foot this is actually [name] lot better compared to the last time I saw her which is also great news. 10/11; patient presents for follow-up. Apligraf and the total contact cast was placed to the left leg at last clinic visit. She states that her right foot wound had burning pain to it with the placement of Apligraf to this area. She has been doing [place] over this area. She denies signs of infection including increased warmth, erythema or purulent drainage. 11/1; 3-week follow-up. The patient fortunately did not have [name] total contact cast or an Apligraf and on the left foot. She has been using Keystone ABD pads and kerlix and her own running shoes She arrives in clinic today with thick callus and [name] very poor surface on the left foot on the right nonviable skin subcutaneous tissue and [name] deep probing hole. 11/15; patient missed her last clinic appointment. She states she has not been dressing the wound beds for the past 2 weeks. She states that at she had [name] new roommate but is now going back to live with her mother. Apparently its been [name] distracting 2 weeks. Patient currently denies signs of infection. 11/22; patient presents for follow-up. She states she has been using Keystone antibiotic and Dakin's wet-to-dry dressings to the wound beds. She is agreeable for cast placement today. We had ordered Apligraf however this has not been received by our facility. 11/29; Patient had [name] total contact cast placed at last clinic visit and she tolerated this well. We  were using silver alginate under the cast. Patient's been  using Keystone antibiotic with Aquacel to the right plantar foot wound. She has no issues or complaints today. Apligraf is available for placement today. Patient would like to proceed with this. 12/6; patient presents for follow-up. She had Apligraf placed in standard fashion last clinic visit under the total contact cast to the left lower extremity. She has been using Keystone antibiotic and Aquacel to the right plantar foot wound. She has no issues or complaints today. 12/13; patient presents for follow-up. She has finished 5 Apligraf placements. Was told she would not qualify for more. We have been doing [name] total contact cast to the left lower extremity. She has been using Keystone antibiotic and Aquacel to the right plantar foot wound. She has no issues or complaints today. 12/20; patient presents for follow-up. We have been using Hydrofera Blue with Keystone antibiotic under [name] total contact cast of the left lower extremity. She reports using Keystone antibiotic and silver alginate to the right heel wound. She has no issues or complaints today. 12/27; patient presents with [name] healthy wound on the left midfoot. We have Apligraf to apply that to that more also using [name] total contact cast. On the right we are using Keystone and silver alginate. She is offloading the right heel with [name] surgical shoealthough by her admission she is on her feet quite [name] bit 1/3; patient presents for follow-up. Apligraf was placed to the wound beds last clinic visit. She was placed in [name] total contact cast to the left lower extremity. She declines [name] total contact cast today. She states that her mother is in the hospital and she cannot adequately get around with the cast on. 1/10; patient presents for follow-up. She declined the total contact cast at last clinic visit. Both wounds have declined in appearance. She states that she has been on her feet and not offloading the wound beds. She currently denies signs of  infection. 1/17; patient presents for follow-up. She had the total contact cast along with Apligraf placed last week to the left lower extremity. She tolerated this well. She has been using Aquacel Ag and Keystone antibiotic to the right heel wound. She currently denies signs of infection. She has no issues or complaints today. 1/24; patient presents for follow-up. We have been using Apligraf to the left foot wound along with [name] total contact cast. She has done well with this. T the right o heel wound she has been using Aquacel Ag and Keystone antibiotic ointment. She has no issues or complaints today. She denies signs of infection. 1/31; patient presents for follow-up. We have been using Apligraf to the left foot wound along with the total contact cast. She continues to do well with this. To the right heel we have been using Aquacel Ag and Keystone antibiotic ointment. She has no issues or complaints today. 12-01-2022 upon evaluation patient is seen today on my schedule due to the fact that she unfortunately was in the hospital yesterday. Her cast needed to come off the only reason she is out of the hospital is due to the fact that they would not take it off at the hospital which is somewhat bewildered reading to me to be perfectly honest. I am not certain why this was but either way she was released and then was placed on my schedule today in order to get this off and reapply the total contact casting as appropriate. I do not have an Apligraf for  her today it was applied last week and today's actually expired yesterday as there was some scheduling conflicts with her being in the hospital. Nonetheless we do not have that for reapplication today but the good news that she is not draining too much and the Apligraf can go for up to 2 weeks so I am going to go ahead and reapply the total contact casting but we are going to leave the Apligraf in place. 2/14; patient presents for follow-up. T the left leg she  has had the total contact cast and Apligraf for the past week. She has had no issues with this. T the o o right heel she has been using antibiotic ointment and Aquacel Ag. 2/21; patient presents for follow-up. We have been using Apligraf and [name] total contact cast to the left lower extremity. She is tolerated this well. Unfortunately she is not approved for any more Apligraf per insurance. She has been using antibiotic ointment and Aquacel Ag to the right foot. She has no issues or complaints today. 2/28; patient presents for follow-up. We have been using Hydrofera Blue and antibiotic ointment under the total contact cast to the left lower extremity. He has been using Aquacel Ag with antibiotic ointment to the right plantar foot. She has no issues or complaints today. 3/6; patient presents for follow-up. She did not obtain her gentamicin ointment. She has been using Aquacel Ag to the right plantar foot wound. We have been using Hydrofera Blue with antibiotic ointment under the total contact cast to the left lower extremity. She has no issues or complaints today. 3/12; patient presents for follow-up. She has been using gentamicin ointment and Aquacel Ag to the right plantar foot wound. We have been using Thanna Hopgood, Altheia [name] ([id number]) [id number] Page 17 of 21 Blue with antibiotic ointment under the total contact cast on the left lower extremity foot wound. She has no issues or complaints today. 3/20; patient presents for follow-up. She has been using gentamicin ointment and Aquacel Ag to the right plantar foot wound. We have been using Hydrofera Blue with antibiotic ointment under the total contact cast to the left lower extremity wound. She has no issues or complaints today. 3/27; patient presents for follow-up. We have been using antibiotic ointment and Aquacel Ag to the right plantar foot wound. We have been using Hydrofera Blue with antibiotic ointment under the  total contact cast to the left lower extremity. Both wounds are smaller. 01-24-2023 upon evaluation today patient actually appears to be doing better compared to last time I saw her. It has been several weeks since I last saw her but nonetheless I think the total contact casting is doing [name] good job. Fortunately I do not see any evidence of infection or worsening overall which is great news and in general I do believe that she is moving in the appropriate direction. It has been [name] very long road but nonetheless I feel like she is nearing the end at least in regard to the left foot and even the right foot looks much better to be perfectly honest. 01-31-2023 upon evaluation today patient appears to be doing well currently in regard to her wounds. She has been require some sharp debridement which I think is probably bone both sides to remove [name] lot of callus that has built up at this point. I discussed that with her today. Also think total contact cast is doing well for the left foot Emina continue that as well. 4/17; patient presents for  follow-up. We have been doing Hydrofera Blue with antibiotic ointment under the total contact cast to the left lower extremity and Aquacel Ag with antibiotic ointment to the right foot wound. She has been using her offloading heel shoe here. 4/24; patient presents for follow-up. We have been doing Hydrofera Blue with antibiotic ointment under the total contact cast to the left lower extremity and Aquacel Ag with antibiotic ointment to the right foot wound. She has been using her offloading heel shoe here. Wounds are smaller with slough accumulation. 5/1; patient presents for follow-up. We have been doing Hydrofera Blue with antibiotic ointment under the total contact cast to the left lower extremity and Aquacel Ag with antibiotic ointment to the right foot wound. Left foot wound is stable and right foot wound appears smaller. Objective Constitutional Vitals Time Taken: 10:53  AM, Height: 69 in, Weight: 178 lbs, BMI: 26.3, Temperature: 98.5 F, Pulse: 73 bpm, Respiratory Rate: 16 breaths/min, Blood Pressure: 100/67 mmHg. General Notes: Right foot: T the plantar heel there is an incision site with increased depth, No probing to bone. T the opening there is Nonviable tissue and o o granulation tissue. Left foot: T the medial aspect there is an open wound with granulation tissue, Callus and slough build up. No overt signs of infection to any o of the wound beds. Integumentary (Hair, Skin) Wound #11 status is Open. Original cause of wound was Surgical Injury. The date acquired was: 12/01/2021. The wound has been in treatment 49 weeks. The wound is located on the Right,Plantar Foot. The wound measures 0.4cm length x 0.2cm width x 0.4cm depth; 0.063cm^2 area and 0.025cm^3 volume. There is Fat Layer (Subcutaneous Tissue) exposed. There is [name] small amount of serous drainage noted. The wound margin is distinct with the outline attached to the wound base. There is no granulation within the wound bed. There is [name] medium (34-66%) amount of necrotic tissue within the wound bed including Adherent Slough. Wound #12 status is Open. Original cause of wound was Pressure Injury. The date acquired was: 03/16/2020. The wound has been in treatment 49 weeks. The wound is located on the Left,Medial,Plantar Foot. The wound measures 0.9cm length x 0.5cm width x 0.2cm depth; 0.353cm^2 area and 0.071cm^3 volume. There is Fat Layer (Subcutaneous Tissue) exposed. There is [name] small amount of serous drainage noted. The wound margin is flat and intact. There is large (67- 100%) red, pink granulation within the wound bed. There is [name] small (1-33%) amount of necrotic tissue within the wound bed including Adherent Slough. 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 I debrided nonviable tissue. I recommended Hydrofera Blue and the total contact cast on the left side and continue with all Aquacel Ag and antibiotic ointment to the right foot. Follow-up in 1 week. Procedures Wound #11 Pre-procedure diagnosis of Wound #11 is an Open Surgical Wound located on the Right,Plantar Foot . There was [name] Excisional Skin/Subcutaneous Tissue Debridement with [name] total area of 0.06 sq cm performed by Geralyn Corwin, MD. With the following instrument(s): Curette to remove Viable and Non-Viable tissue/material. Material removed includes Subcutaneous Tissue, Slough, Skin: Dermis, and Skin: Epidermis. [name] time out was conducted at 11:15,  prior to the start of the procedure. [name] Minimum amount of bleeding was controlled with Pressure. The procedure was tolerated well. Post Debridement Measurements: KYNEDI, LAMME [name] ([id number]) [id number] Page 18 of 21 length x 0.2cm width x 0.4cm depth; 0.025cm^3 volume. Character of Wound/Ulcer Post Debridement is stable. Post procedure Diagnosis Wound #11: Same as Pre-Procedure Wound #12 Pre-procedure diagnosis of Wound #12 is [name] Diabetic Wound/Ulcer of the Lower Extremity located on the Left,Medial,Plantar Foot .Severity of Tissue Pre Debridement is: Fat layer exposed. There was [name] Selective/Open Wound Skin/Epidermis Debridement with [name] total area of 0.35 sq cm performed by Geralyn Corwin, MD. With the following instrument(s): Curette to remove Viable and Non-Viable tissue/material. Material removed includes Callus, Slough, Skin: Dermis, and Skin: Epidermis. [name] time out was conducted at 11:12, prior to the start of the procedure. [name] Minimum amount of bleeding was controlled with Pressure.  The procedure was tolerated well. Post Debridement Measurements: 0.9cm length x 0.5cm width x 0.2cm depth; 0.071cm^3 volume. Character of Wound/Ulcer Post Debridement is stable. Severity of Tissue Post Debridement is: Fat layer exposed. Post procedure Diagnosis Wound #12: Same as Pre-Procedure Pre-procedure diagnosis of Wound #12 is [name] Diabetic Wound/Ulcer of the Lower Extremity located on the Left,Medial,Plantar Foot . There was [name] T Contact otal Cast Procedure by Geralyn Corwin, MD. Post procedure Diagnosis Wound #12: Same as Pre-Procedure Plan Follow-up Appointments: Return Appointment in 1 week. Nurse Visit as needed Bathing/ Shower/ Hygiene: May shower with wound dressing protected with water repellent cover or cast protector. No tub bath. Anesthetic (Use 'Patient Medications' Section for Anesthetic Order Entry): Lidocaine applied to wound bed Edema Control - Lymphedema / Segmental Compressive Device / Other: Elevate, Exercise Daily and Avoid Standing for Long Periods of Time. Elevate legs to the level of the heart and pump ankles as often as possible Elevate leg(s) parallel to the floor when sitting. Off-Loading: T Contact Cast to Left Lower Extremity otal Open toe surgical shoe - Heel off loader - Right foot Other: - keep pressure off of feet. Additional Orders / Instructions: Follow Nutritious Diet and Increase Protein Intake Other: - Stay off of foot, wheelchair only WOUND #11: - Foot Wound Laterality: Plantar, Right Cleanser: Wound Cleanser 1 x Per Day/30 Days Discharge Instructions: Wash your hands with soap and water. Remove old dressing, discard into plastic bag and place into trash. Cleanse the wound with Wound Cleanser prior to applying [name] clean dressing using gauze sponges, not tissues or cotton balls. Do not scrub or use excessive force. Pat dry using gauze sponges, not tissue or cotton balls. Topical: Gentamicin 1 x Per Day/30 Days Discharge Instructions: Apply as  directed by provider. in clinic Prim Dressing: Aquacel Extra Hydrofiber Dressing, 2x2 (in/in) 1 x Per Day/30 Days ary Secondary Dressing: ABD Pad 5x9 (in/in) (Generic) 1 x Per Day/30 Days Discharge Instructions: Cover with ABD pad Secured With: Medipore T - 43M Medipore H Soft Cloth Surgical T ape ape, 2x2 (in/yd) 1 x Per Day/30 Days Secured With: [employer] Sterile or Non-Sterile 6-ply 4.5x4 (yd/yd) 1 x Per Day/30 Days Discharge Instructions: Apply Kerlix as directed WOUND #12: - Foot Wound Laterality: Plantar, Left, Medial Cleanser: Wound Cleanser 1 x Per Week/30 Days Discharge Instructions: Wash your hands with soap and water. Remove old dressing, discard into plastic bag and place into trash. Cleanse the wound with Wound Cleanser prior to applying [name] clean dressing using gauze sponges, not tissues or cotton balls. Do not scrub or use excessive force. Pat dry using gauze  sponges, not tissue or cotton balls. Prim Dressing: Hydrofera Blue Ready Transfer Foam, 2.5x2.5 (in/in) 1 x Per Week/30 Days ary Discharge Instructions: Apply Hydrofera Blue Ready to wound bed as directed Secondary Dressing: ABD Pad 5x9 (in/in) (Generic) 1 x Per Week/30 Days Discharge Instructions: Cover with ABD pad Secured With: Medipore T - 31M Medipore H Soft Cloth Surgical T ape ape, 2x2 (in/yd) 1 x Per Week/30 Days Secured With: [employer] Sterile or Non-Sterile 6-ply 4.5x4 (yd/yd) 1 x Per Week/30 Days Discharge Instructions: Apply Kerlix as directed 1. In office sharp debridement 2. Hydrofera Blue 3. Aquacel Ag 4. Antibiotic ointment 5. T contact cast placed in standard fashion otal 6. Follow-up in 1 week Electronic Signature(s) Signed: 02/22/2023 12:46:15 PM By: Geralyn Corwin DO Entered By: Geralyn Corwin on 02/22/2023 11:55:58 [name] ([id number]) [id number] Page 19 of 21 -------------------------------------------------------------------------------- ROS/PFSH  Details Patient Name: Date of Service: [name], [name] 02/22/2023 10:45 [name]: [id number] Patient Account Number: [id number] Date of Birth/Sex: Treating RN: Sep 24, 1976 (47 y.o. Skip Mayer Primary Care Provider: [name] Other Clinician: [name] Referring Provider: Treating Provider/Extender: Chapman Moss in Treatment: 53 Information Obtained From Patient Ear/Nose/Mouth/Throat Medical History: Positive for: Chronic sinus problems/congestion; Middle ear problems Hematologic/Lymphatic Medical History: Positive for: Anemia Respiratory Medical History: Positive for: Chronic Obstructive Pulmonary Disease (COPD) Cardiovascular Medical History: Positive for: Congestive Heart Failure - EF 33% Endocrine Medical History: Positive for: Type II Diabetes Time with diabetes: 14 years Treated with: Insulin, Oral agents Blood sugar tested every day: No Blood sugar testing results: Bedtime: 176 Genitourinary Medical History: Positive for: End Stage Renal Disease Integumentary (Skin) Medical History: Positive for: History of pressure wounds Neurologic Medical History: Positive for: Neuropathy - feel and legs HBO Extended History Items Ear/Nose/Mouth/Throat: Ear/Nose/Mouth/Throat: Chronic sinus Middle ear problems problems/congestion Immunizations Pneumococcal Vaccine: Received Pneumococcal Vaccination: No Implantable Devices No devices added Family and Social History Current every day smoker; Marital Status - Single; Alcohol Use: Never; Drug Use: Prior History; Caffeine Use: Daily Electronic Signature(s) Signed: 02/22/2023 12:46:15 PM By: Kerrin Champagne, Raechelle [name] ([id number]) [id number] Page 20 of 21 Signed: 02/23/2023 4:56:06 PM By: Elliot Gurney, BSN, RN, CWS, [name] RN, BSN Entered By: Geralyn Corwin on 02/22/2023  11:56:44 -------------------------------------------------------------------------------- Total Contact Cast Details Patient Name: Date of Service: [name], [name] 02/22/2023 10:45 [name]: [id number] Patient Account Number: [id number] Date of Birth/Sex: Treating RN: Mar 07, 1976 (47 y.o. Skip Mayer Primary Care Provider: [name] Other Clinician: [name] Referring Provider: Treating Provider/Extender: Chapman Moss in Treatment: 39 T Contact Cast Applied for Wound Assessment: otal Wound #12 Left,Medial,Plantar Foot Performed By: Physician Geralyn Corwin, MD Post Procedure Diagnosis Same as Pre-procedure Electronic Signature(s) Signed: 02/22/2023 12:46:15 PM By: Geralyn Corwin DO Signed: 02/22/2023 4:46:34 PM By: [name] Entered By: [name] on 02/22/2023 11:16:35 -------------------------------------------------------------------------------- SuperBill Details Patient Name: Date of Service: [name] 02/22/2023 Medical Record Number: [id number] Patient Account Number: [id number] Date of Birth/Sex: Treating RN: 1976/02/25 (47 y.o. Cathlean Cower, [name] Primary Care Provider: [name] Other Clinician: [name] Referring Provider: Treating Provider/Extender: Chapman Moss in Treatment: 49 Diagnosis Coding ICD-10 Codes Code Description [phone] Non-pressure chronic ulcer of other part of left foot with other specified severity 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.621 Type 2 diabetes mellitus with foot ulcer E11.42 Type 2 diabetes mellitus with diabetic polyneuropathy L97.811 Non-pressure  chronic ulcer of other part of right lower leg limited to breakdown of skin Z79.4 Long term (current) use of insulin Facility Procedures : CPT4 Code: 16109604 Description: 11042 - DEB SUBQ TISSUE 20 SQ CM/< ICD-10 Diagnosis Description  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.621 Type 2 diabetes mellitus with foot ulcer Modifier: Quantity: 1 : CPT4 Code: 54098119 Description: 14782 - APPLY TOTAL CONTACT LEG CAST ICD-10 Diagnosis Description 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 E11.621 Type 2 diabetes mellitus with foot ulcer Modifier: ity Quantity: 1 Physician Procedures : CPT4 Code Description Modifier [id number] 11042 - WC PHYS SUBQ TISS 20 SQ CM ICD-10 Diagnosis Description 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

## 2023-02-24 NOTE — Progress Notes (Signed)
[name] ([id number]) [id number] Page 1 of 9 Visit Report for 02/22/2023 Arrival Information Details Patient Name: Date of Service: [name], [name] 02/22/2023 10:45 [name]: [id number] Patient Account Number: [id number] Date of Birth/Sex: Treating RN: [name] (47 y.o. [name] Primary Care Puja Caffey: [name] Other Clinician: [name] Referring Sadaf Przybysz: Treating Carma Dwiggins/Extender: Chapman Moss in Treatment: 30 Visit Information History Since Last Visit All ordered tests and consults were completed: No Patient Arrived: Ambulatory Added or deleted any medications: No Arrival Time: 10:51 Any new allergies or adverse reactions: No Transfer Assistance: None Had [name] fall or experienced change in No Patient Identification Verified: Yes activities of daily living that may affect Secondary Verification Process Completed: Yes risk of falls: Patient Requires Transmission-Based Precautions: No Signs or symptoms of abuse/neglect since last visito No Patient Has Alerts: No Hospitalized since last visit: No Implantable device outside of the clinic excluding No cellular tissue based products placed in the center since last visit: Has Dressing in Place as Prescribed: Yes Has Footwear/Offloading in Place as Prescribed: Yes Left: T Contact Cast otal Right: Wedge Shoe Pain Present Now: No Electronic Signature(s) Signed: 02/22/2023 4:46:34 PM By: [name] Entered By: [name] on 02/22/2023 10:52:44 -------------------------------------------------------------------------------- Clinic Level of Care Assessment Details Patient Name: Date of Service: [name], [name] 02/22/2023 10:45 [name]: [id number] Patient Account Number: [id number] Date of Birth/Sex: Treating RN: [name] (47 y.o. [name] Primary Care Sharone Picchi: [name] Other Clinician: [name] Referring Bransen Fassnacht: Treating Buddie Marston/Extender: Chapman Moss in Treatment: 79 Clinic Level of Care Assessment Items TOOL 1 Quantity Score []  - 0 Use when EandM and Procedure is performed on INITIAL visit ASSESSMENTS - Nursing Assessment / Reassessment []  - 0 General Physical Exam (combine w/ comprehensive assessment (listed just below) when performed on new pt. evals) []  - 0 Comprehensive Assessment (HX, ROS, Risk Assessments, Wounds Hx, etc.) ASSESSMENTS - Wound and Skin Assessment / Reassessment []  - 0 Dermatologic / Skin Assessment (not related to wound area) ASSESSMENTS - Ostomy and/or Continence Assessment and Care []  - 0 Incontinence Assessment and Management []  - 0 Ostomy Care Assessment and Management (repouching, etc.) PROCESS - Coordination of Care []  - 0 Simple Patient / Family Education for ongoing care []  - 0 Complex (extensive) Patient / Family Education for ongoing care []  - 0 Staff obtains Chiropractor, Records, T Results / Process Orders est []  - 0 Staff telephones HHA, Nursing Homes / Clarify orders / etc []  - 0 Routine Transfer to another Facility (non-emergent condition) Virag, Evangelyn [name] ([id number]) [phone].pdf Page 2 of 9 []  - 0 Routine Hospital Admission (non-emergent condition) []  - 0 New Admissions / Manufacturing engineer / Ordering NPWT Apligraf, etc. , []  - 0 Emergency Hospital Admission (emergent condition) PROCESS - Special Needs []  - 0 Pediatric / Minor Patient Management []  - 0 Isolation Patient Management []  - 0 Hearing / Language / Visual special needs []  - 0 Assessment of Community assistance (transportation, D/C planning, etc.) []  - 0 Additional assistance / Altered mentation []  - 0 Support Surface(s) Assessment (bed, cushion, seat, etc.) INTERVENTIONS - Miscellaneous []  - 0 External ear exam []  - 0 Patient Transfer (multiple staff / Nurse, adult / Similar devices) []  -  0 Simple Staple / Suture removal (25 or less) []  - 0 Complex Staple / Suture removal (26 or more) []  - 0 Hypo/Hyperglycemic Management (do not check if billed separately) []  -  0 Ankle / Brachial Index (ABI) - do not check if billed separately Has the patient been seen at the hospital within the last three years: Yes Total Score: 0 Level Of Care: ____ Electronic Signature(s) Signed: 02/22/2023 4:46:34 PM By: [name] Entered By: [name] on 02/22/2023 11:47:25 -------------------------------------------------------------------------------- Encounter Discharge Information Details Patient Name: Date of Service: Heather Jefferson [name]. 02/22/2023 10:45 [name]: [id number] Patient Account Number: [id number] Date of Birth/Sex: Treating RN: [name][birth year] (47 y.o. [name] Primary Care Jassiel Flye: [name] Other Clinician: [name] Referring Ahjanae Cassel: Treating Jacody Beneke/Extender: Chapman Moss in Treatment: 26 Encounter Discharge Information Items Post Procedure Vitals Discharge Condition: Stable Temperature (F): 98.5 Ambulatory Status: Wheelchair Pulse (bpm): 73 Discharge Destination: Home Respiratory Rate (breaths/min): 16 Transportation: Private Auto Blood Pressure (mmHg): 100/67 Accompanied By: mother Schedule Follow-up Appointment: Yes Clinical Summary of Care: Electronic Signature(s) Signed: 02/22/2023 4:46:34 PM By: [name] Entered By: [name] on 02/22/2023 11:50:02 -------------------------------------------------------------------------------- Lower Extremity Assessment Details Patient Name: Date of Service: [name], [name] 02/22/2023 10:45 [name]: [id number] Patient Account Number: [id number] Date of Birth/Sex: Treating RN: [name][birth year] (47 y.o. [name] Primary Care Jayel Inks: [name] Other Clinician: [name] Referring [name] Albers: Treating Tariyah Pendry/Extender:  Chapman Moss in Treatment: [address], Kyriana [name] ([id number]) [id number] Page 3 of 9 Electronic Signature(s) Signed: 02/22/2023 4:46:34 PM By: [name] Signed: 02/23/2023 4:56:06 PM By: Elliot Gurney, BSN, RN, CWS, [name] RN, BSN Entered By: [name] on 02/22/2023 11:09:38 -------------------------------------------------------------------------------- Multi Wound Chart Details Patient Name: Date of Service: Heather Jefferson [name]. 02/22/2023 10:45 [name]: [id number] Patient Account Number: [id number] Date of Birth/Sex: Treating RN: [birth year] (47 y.o. Cathlean Cower, [name] Primary Care Davonne Jarnigan: [name] Other Clinician: [name] Referring Kemisha Bonnette: Treating Allah Reason/Extender: Chapman Moss in Treatment: 30 Vital Signs Height(in): 69 Pulse(bpm): 73 Weight(lbs): 178 Blood Pressure(mmHg): 100/67 Body Mass Index(BMI): 26.3 Temperature(F): 98.5 Respiratory Rate(breaths/min): 16 [11:Photos:] [N/[name]:N/[name]] Right, Plantar Foot Left, Medial, Plantar Foot N/[name] Wound Location: Surgical Injury Pressure Injury N/[name] Wounding Event: Open Surgical Wound 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: Extremity Chronic sinus problems/congestion, Chronic sinus problems/congestion, N/[name] Comorbid History: Middle ear problems, Anemia, Chronic Middle ear problems, Anemia, Chronic Obstructive Pulmonary Disease Obstructive Pulmonary Disease (COPD), Congestive Heart Failure, (COPD), Congestive Heart Failure, Type II Diabetes, End Stage Renal Type II Diabetes, End Stage Renal Disease, History of pressure wounds, Disease, History of pressure wounds, Neuropathy Neuropathy 12/01/2021 03/16/2020 N/[name] Date Acquired: 82 49 N/[name] Weeks of Treatment: Open Open N/[name] Wound Status: No No N/[name] Wound Recurrence: 0.4x0.2x0.4 0.9x0.5x0.2 N/[name] Measurements L x W x D (cm) 0.063 0.353 N/[name] [name] (cm) : rea 0.025 0.071  N/[name] Volume (cm) : 96.60% 92.50% N/[name] % Reduction in Area: 98.50% 92.50% N/[name] % Reduction in Volume: Full Thickness Without Exposed Grade 3 N/[name] Classification: Support Structures Small Small N/[name] Exudate Amount: Serous Serous N/[name] Exudate Type: amber amber N/[name] Exudate Color: Distinct, outline attached Flat and Intact N/[name] Wound Margin: None Present (0%) Large (67-100%) N/[name] Granulation Amount: N/[name] Red, Pink N/[name] Granulation Quality: Medium (34-66%) Small (1-33%) N/[name] Necrotic Amount: Fat Layer (Subcutaneous Tissue): Yes Fat Layer (Subcutaneous Tissue): Yes N/[name] Exposed Structures: Fascia: No Fascia: No Tendon: No Tendon: No Muscle: No Muscle: No Joint: No Joint: No Bone: No Bone: No Medium (34-66%) None N/[name] Epithelialization: Treatment Notes Electronic Signature(s) Signed: 02/22/2023 4:46:34 PM By: Esmond Plants ([id number]) PM By: Glynda Jaeger,  Angie [id number].pdf Page 4 of 9 Signed: 02/22/2023 4:46:34 Entered By: [name] on 02/22/2023 11:09:42 -------------------------------------------------------------------------------- Multi-Disciplinary Care Plan Details Patient Name: Date of Service: DARRIN, LOBER 02/22/2023 10:45 [name]: [id number] Patient Account Number: [id number] Date of Birth/Sex: Treating RN: [birth year][name] (47 y.o. Cathlean Cower, [name] Primary Care Rosemary Pentecost: [name] Other Clinician: [name] Referring Latacha Texeira: Treating Desarie Feild/Extender: Chapman Moss in Treatment: 50 Active Inactive Abuse / Safety / Falls / Self Care Management Nursing Diagnoses: History of Falls Potential for falls Potential for injury related to falls Goals: Patient will not develop complications from immobility Date Initiated: 03/17/2022 Date Inactivated: 04/20/2022 Target Resolution Date: 03/16/2022 Goal Status: Met Patient/caregiver will verbalize understanding of skin care regimen Date  Initiated: 03/17/2022 Date Inactivated: 09/28/2022 Target Resolution Date: 03/16/2022 Goal Status: Met Patient/caregiver will verbalize/demonstrate measure taken to improve self care Date Initiated: 03/17/2022 Target Resolution Date: 03/16/2022 Goal Status: Active Interventions: Assess fall risk on admission and as needed Provide education on basic hygiene Provide education on personal and home safety Notes: Necrotic Tissue Nursing Diagnoses: Impaired tissue integrity related to necrotic/devitalized tissue Knowledge deficit related to management of necrotic/devitalized tissue Goals: Necrotic/devitalized tissue will be minimized in the wound bed Date Initiated: 03/17/2022 Target Resolution Date: 03/19/2023 Goal Status: Active Patient/caregiver will verbalize understanding of reason and process for debridement of necrotic tissue Date Initiated: 03/17/2022 Date Inactivated: 09/28/2022 Target Resolution Date: 03/16/2022 Goal Status: Met Interventions: Provide education on necrotic tissue and debridement process Treatment Activities: Apply topical anesthetic as ordered : 03/16/2022 Excisional debridement : 03/16/2022 Notes: Osteomyelitis Nursing Diagnoses: Infection: osteomyelitis Knowledge deficit related to disease process and management Goals: Patient/caregiver will verbalize understanding of disease process and disease management Date Initiated: 03/17/2022 Target Resolution Date: 03/16/2022 Goal Status: Active MELECIA, CREMEANS [name] ([id number]) [phone].pdf Page 5 of 9 Patient's osteomyelitis will resolve Date Initiated: 03/17/2022 Target Resolution Date: 10/26/2022 Goal Status: Active Signs and symptoms for osteomyelitis will be recognized and promptly addressed Date Initiated: 03/17/2022 Target Resolution Date: 10/26/2022 Goal Status: Active Interventions: Assess for signs and symptoms of osteomyelitis resolution every visit Provide education on  osteomyelitis Treatment Activities: Systemic antibiotics : 03/16/2022 Notes: Patient on antibiotics. Pressure Nursing Diagnoses: Knowledge deficit related to management of pressures ulcers Potential for impaired tissue integrity related to pressure, friction, moisture, and shear Goals: Patient will remain free from development of additional pressure ulcers Date Initiated: 11/02/2022 Target Resolution Date: 03/17/2023 Goal Status: Active Patient/caregiver will verbalize risk factors for pressure ulcer development Date Initiated: 11/02/2022 Target Resolution Date: 03/17/2023 Goal Status: Active Patient/caregiver will verbalize understanding of pressure ulcer management Date Initiated: 11/02/2022 Target Resolution Date: 03/17/2023 Goal Status: Active Interventions: Assess: immobility, friction, shearing, incontinence upon admission and as needed Provide education on pressure ulcers Notes: Electronic Signature(s) Signed: 02/22/2023 4:46:34 PM By: [name] Signed: 02/23/2023 4:56:06 PM By: Elliot Gurney, BSN, RN, CWS, [name] RN, BSN Entered By: [name] on 02/22/2023 11:48:12 -------------------------------------------------------------------------------- Pain Assessment Details Patient Name: Date of Service: Heather Jefferson [name]. 02/22/2023 10:45 [name]: [id number] Patient Account Number: [id number] Date of Birth/Sex: Treating RN: [birth year][name] (47 y.o. [name] Primary Care Almond Fitzgibbon: [name] Other Clinician: [name] Referring Jadia Capers: Treating Tila Millirons/Extender: Chapman Moss in Treatment: 40 Active Problems Location of Pain Severity and Description of Pain Patient Has Paino No Site Locations [place], [place] [name] ([id number]) [id number] Page 6 of 9 Pain Management and Medication Current Pain Management: Electronic Signature(s) Signed: 02/22/2023 11:09:32 AM By: Elliot Gurney, BSN, RN,  CWS, Stedman Bing, BSN Signed:  02/22/2023 4:46:34 PM By: [name] Entered By: [name] on 02/22/2023 10:56:21 -------------------------------------------------------------------------------- Patient/Caregiver Education Details Patient Name: Date of Service: [name] 5/1/2024andnbsp10:45 [name]: [id number] Patient Account Number: [id number] Date of Birth/Gender: Treating RN: [birth year] [name] (47 y.o. [name] Primary Care Physician: [name] Other Clinician: [name] Referring Physician: Treating Physician/Extender: Chapman Moss in Treatment: 64 Education Assessment Education Provided To: Patient Education Topics Provided Offloading: Handouts: Other: discuss staying off feet as much as possible Methods: Explain/Verbal Responses: State content correctly Wound/Skin Impairment: Handouts: Other: continue wound care as directed Methods: Explain/Verbal Responses: State content correctly Electronic Signature(s) Signed: 02/22/2023 4:46:34 PM By: [name] Entered By: [name] on 02/22/2023 11:49:03 -------------------------------------------------------------------------------- Wound Assessment Details Patient Name: Date of Service: [name], [name] 02/22/2023 10:45 [name]: [id number] Patient Account Number: [id number] Date of Birth/Sex: Treating RN: [birth year][name] (47 y.o. [name] Primary Care Cadance Raus: [name] Other Clinician: [name] Referring Mariel Lukins: Treating Mylei Brackeen/Extender: Chapman Moss in Treatment: [address] Janell [name] ([id number]) [id number] Page 7 of 9 Wound Status Wound Number: 11 Primary Open Surgical Wound Etiology: Wound Location: Right, Plantar Foot Wound Open Wounding Event: Surgical Injury Status: Date Acquired: 12/01/2021 Comorbid Chronic sinus problems/congestion, Middle ear problems, Anemia, Weeks Of Treatment:  49 History: Chronic Obstructive Pulmonary Disease (COPD), Congestive Heart Clustered Wound: No Failure, Type II Diabetes, End Stage Renal Disease, History of pressure wounds, Neuropathy Photos Wound Measurements Length: (cm) 0.4 Width: (cm) 0.2 Depth: (cm) 0.4 Area: (cm) 0.063 Volume: (cm) 0.025 % Reduction in Area: 96.6% % Reduction in Volume: 98.5% Epithelialization: Medium (34-66%) Wound Description Classification: Full Thickness Without Exposed Support Structures Wound Margin: Distinct, outline attached Exudate Amount: Small Exudate Type: Serous Exudate Color: amber Foul Odor After Cleansing: No Slough/Fibrino Yes Wound Bed Granulation Amount: None Present (0%) Exposed Structure Necrotic Amount: Medium (34-66%) Fascia Exposed: No Necrotic Quality: Adherent Slough Fat Layer (Subcutaneous Tissue) Exposed: Yes Tendon Exposed: No Muscle Exposed: No Joint Exposed: No Bone Exposed: No Treatment Notes Wound #11 (Foot) Wound Laterality: Plantar, Right Cleanser Wound Cleanser Discharge Instruction: Wash your hands with soap and water. Remove old dressing, discard into plastic bag and place into trash. Cleanse the wound with Wound Cleanser prior to applying [name] clean dressing using gauze sponges, not tissues or cotton balls. Do not scrub or use excessive force. Pat dry using gauze sponges, not tissue or cotton balls. Heather-Wound Care Topical Gentamicin Discharge Instruction: Apply as directed by Devynn Hessler. in clinic Primary Dressing Aquacel Extra Hydrofiber Dressing, 2x2 (in/in) Secondary Dressing ABD Pad 5x9 (in/in) Discharge Instruction: Cover with ABD pad Secured With Medipore T - 54M Medipore H Soft Cloth Surgical T ape ape, 2x2 (in/yd) Kerlix Roll Sterile or Non-Sterile 6-ply 4.5x4 (yd/yd) Discharge Instruction: Apply Kerlix as directed [name], [name] ([id number]) [phone].pdf Page 8 of 9 Compression Wrap Compression  Stockings Add-Ons Electronic Signature(s) Signed: 02/22/2023 11:09:32 AM By: Elliot Gurney, BSN, RN, CWS, [name] RN, BSN Signed: 02/22/2023 4:46:34 PM By: [name] Entered By: [name] on 02/22/2023 11:08:57 -------------------------------------------------------------------------------- Wound Assessment Details Patient Name: Date of Service: Heather Jefferson [name]. 02/22/2023 10:45 [name]: [id number] Patient Account Number: [id number] Date of Birth/Sex: Treating RN: [birth year] (47 y.o. [name] Primary Care Desmond Szabo: [name] Other Clinician: [name] Referring Latoya Diskin: Treating Dhilan Brauer/Extender: [name] Weeks in Treatment: 72 Wound Status Wound Number: 12 Primary Diabetic Wound/Ulcer of  the Lower Extremity Etiology: Wound Location: Left, Medial, Plantar Foot Wound Open Wounding Event: Pressure Injury Status: Date Acquired: 03/16/2020 Comorbid Chronic sinus problems/congestion, Middle ear problems, Anemia, Weeks Of Treatment: 49 History: Chronic Obstructive Pulmonary Disease (COPD), Congestive Heart Clustered Wound: No Failure, Type II Diabetes, End Stage Renal Disease, History of pressure wounds, Neuropathy Photos Wound Measurements Length: (cm) 0.9 Width: (cm) 0.5 Depth: (cm) 0.2 Area: (cm) 0.353 Volume: (cm) 0.071 % Reduction in Area: 92.5% % Reduction in Volume: 92.5% Epithelialization: None Wound Description Classification: Grade 3 Wound Margin: Flat and Intact Exudate Amount: Small Exudate Type: Serous Exudate Color: amber Foul Odor After Cleansing: No Slough/Fibrino No Wound Bed Granulation Amount: Large (67-100%) Exposed Structure Granulation Quality: Red, Pink Fascia Exposed: No Necrotic Amount: Small (1-33%) Fat Layer (Subcutaneous Tissue) Exposed: Yes Necrotic Quality: Adherent Slough Tendon Exposed: No Muscle Exposed: No Joint Exposed: No Bone Exposed: No Treatment Notes Wound #12 (Foot)  Wound Laterality: Plantar, Left, Medial Cleanser Schmaltz, Hanne [name] ([id number]) [phone].pdf Page 9 of 9 Wound Cleanser Discharge Instruction: Wash your hands with soap and water. Remove old dressing, discard into plastic bag and place into trash. Cleanse the wound with Wound Cleanser prior to applying [name] clean dressing using gauze sponges, not tissues or cotton balls. Do not scrub or use excessive force. Pat dry using gauze sponges, not tissue or cotton balls. Heather-Wound Care Topical Primary Dressing Hydrofera Blue Ready Transfer Foam, 2.5x2.5 (in/in) Discharge Instruction: Apply Hydrofera Blue Ready to wound bed as directed Secondary Dressing ABD Pad 5x9 (in/in) Discharge Instruction: Cover with ABD pad Secured With Medipore T - 39M Medipore H Soft Cloth Surgical T ape ape, 2x2 (in/yd) Kerlix Roll Sterile or Non-Sterile 6-ply 4.5x4 (yd/yd) Discharge Instruction: Apply Kerlix as directed Compression Wrap Compression Stockings Add-Ons Electronic Signature(s) Signed: 02/22/2023 4:46:34 PM By: [name] Signed: 02/23/2023 4:56:06 PM By: Elliot Gurney, BSN, RN, CWS, [name] RN, BSN Entered By: [name] on 02/22/2023 11:09:27 -------------------------------------------------------------------------------- Vitals Details Patient Name: Date of Service: [name], [name] 02/22/2023 10:45 [name]: [id number] Patient Account Number: [id number] Date of Birth/Sex: Treating RN: [name][birth year] (47 y.o. Cathlean Cower, [name] Primary Care Ahtziri Jeffries: [name] Other Clinician: [name] Referring Clois Montavon: Treating Gavin Telford/Extender: Chapman Moss in Treatment: 26 Vital Signs Time Taken: 10:53 Temperature (F): 98.5 Height (in): 69 Pulse (bpm): 73 Weight (lbs): 178 Respiratory Rate (breaths/min): 16 Body Mass Index (BMI): 26.3 Blood Pressure (mmHg): 100/67 Reference Range: 80 - 120 mg / dl Electronic Signature(s) Signed: 02/22/2023  4:46:34 PM By: [name] Entered By: [name] on 02/22/2023 10:55:55

## 2023-03-01 ENCOUNTER — Encounter: Payer: Medicaid Other | Admitting: Internal Medicine

## 2023-03-01 DIAGNOSIS — E11621 Type 2 diabetes mellitus with foot ulcer: Secondary | ICD-10-CM | POA: Diagnosis not present

## 2023-03-02 NOTE — Progress Notes (Signed)
Heather, [name] A ([id number]) [id number] Page 1 of 9 Visit Report for 03/01/2023 Arrival Information Details Patient Name: Date of Service: [name], [name] 03/01/2023 11:00 A M Medical Record Number: [id number] Patient Account Number: [id number] Date of Birth/Sex: Treating RN: Mar 13, 1976 (47 y.o. Ginette Pitman Primary Care Meika Earll: [name] Other Clinician: [name] Referring Marvell Stavola: Treating Florita Nitsch/Extender: RO BSO N, MICHA EL Adela Lank, [name] Weeks in Treatment: 50 Visit Information History Since Last Visit All ordered tests and consults were completed: No Patient Arrived: Wheel Chair Added or deleted any medications: No Arrival Time: 11:06 Any new allergies or adverse reactions: No Transfer Assistance: None Had a fall or experienced change in No Patient Identification Verified: Yes activities of daily living that may affect Secondary Verification Process Completed: Yes risk of falls: Patient Requires Transmission-Based Precautions: No Signs or symptoms of abuse/neglect since last visito No Patient Has Alerts: No Hospitalized since last visit: No Implantable device outside of the clinic excluding No cellular tissue based products placed in the center since last visit: Has Dressing in Place as Prescribed: Yes Has Footwear/Offloading in Place as Prescribed: Yes Left: T Contact Cast otal Pain Present Now: Yes Electronic Signature(s) Signed: 03/01/2023 4:34:51 PM By: [name] Entered By: [name] on 03/01/2023 11:10:47 -------------------------------------------------------------------------------- Clinic Level of Care Assessment Details Patient Name: Date of Service: Heather, [name] 03/01/2023 11:00 A M Medical Record Number: [id number] Patient Account Number: [id number] Date of Birth/Sex: Treating RN: 29-Aug-1976 (47 y.o. Ginette Pitman Primary Care Aryanna Shaver: [name] Other Clinician: [name] Referring  Kazim Corrales: Treating Abigial Newville/Extender: RO BSO N, MICHA EL Adela Lank, [name] Weeks in Treatment: 50 Clinic Level of Care Assessment Items TOOL 1 Quantity Score []  - 0 Use when EandM and Procedure is performed on INITIAL visit ASSESSMENTS - Nursing Assessment / Reassessment []  - 0 General Physical Exam (combine w/ comprehensive assessment (listed just below) when performed on new pt. evals) []  - 0 Comprehensive Assessment (HX, ROS, Risk Assessments, Wounds Hx, etc.) ASSESSMENTS - Wound and Skin Assessment / Reassessment []  - 0 Dermatologic / Skin Assessment (not related to wound area) ASSESSMENTS - Ostomy and/or Continence Assessment and Care []  - 0 Incontinence Assessment and Management []  - 0 Ostomy Care Assessment and Management (repouching, etc.) PROCESS - Coordination of Care []  - 0 Simple Patient / Family Education for ongoing care []  - 0 Complex (extensive) Patient / Family Education for ongoing care []  - 0 Staff obtains Chiropractor, Records, T Results / Process Orders est []  - 0 Staff telephones HHA, Nursing Homes / Clarify orders / etc []  - 0 Routine Transfer to another Facility (non-emergent condition) Privette, Heidee A ([id number]) [id number] Page 2 of 9 []  - 0 Routine Hospital Admission (non-emergent condition) []  - 0 New Admissions / Manufacturing engineer / Ordering NPWT Apligraf, etc. , []  - 0 Emergency Hospital Admission (emergent condition) PROCESS - Special Needs []  - 0 Pediatric / Minor Patient Management []  - 0 Isolation Patient Management []  - 0 Hearing / Language / Visual special needs []  - 0 Assessment of Community assistance (transportation, D/C planning, etc.) []  - 0 Additional assistance / Altered mentation []  - 0 Support Surface(s) Assessment (bed, cushion, seat, etc.) INTERVENTIONS - Miscellaneous []  - 0 External ear exam []  - 0 Patient Transfer (multiple staff / Nurse, adult / Similar devices) []  - 0 Simple  Staple / Suture removal (25 or less) []  - 0 Complex Staple / Suture removal (26 or more) []  - 0 Hypo/Hyperglycemic Management (do not  check if billed separately) []  - 0 Ankle / Brachial Index (ABI) - do not check if billed separately Has the patient been seen at the hospital within the last three years: Yes Total Score: 0 Level Of Care: ____ Electronic Signature(s) Signed: 03/01/2023 4:34:51 PM By: [name] Entered By: [name] on 03/01/2023 11:30:42 -------------------------------------------------------------------------------- Encounter Discharge Information Details Patient Name: Date of Service: Heather Jefferson A. 03/01/2023 11:00 A M Medical Record Number: [id number] Patient Account Number: [id number] Date of Birth/Sex: Treating RN: 1976/06/07 (47 y.o. Ginette Pitman Primary Care Dimitriy Carreras: [name] Other Clinician: [name] Referring Jomel Whittlesey: Treating Nabor Thomann/Extender: RO BSO N, MICHA EL Adela Lank, Georgiann Hahn in Treatment: 50 Encounter Discharge Information Items Discharge Condition: Stable Ambulatory Status: Wheelchair Discharge Destination: Home Transportation: Private Auto Accompanied By: mother Schedule Follow-up Appointment: Yes Clinical Summary of Care: Electronic Signature(s) Signed: 03/01/2023 4:34:51 PM By: [name] Entered By: [name] on 03/01/2023 12:00:17 -------------------------------------------------------------------------------- Lower Extremity Assessment Details Patient Name: Date of Service: Heather, [name] 03/01/2023 11:00 A M Medical Record Number: [id number] Patient Account Number: [id number] Date of Birth/Sex: Treating RN: June 21, 1976 (47 y.o. Ginette Pitman Primary Care Chasyn Cinque: [name] Other Clinician: [name] Referring Emri Sample: Treating Desha Bitner/Extender: Chauncey Mann, MICHA EL Adela Lank, [name] Weeks in Treatment: [address] Charlise A ([id number]) [id number] Page 3 of  9 Electronic Signature(s) Signed: 03/01/2023 4:34:51 PM By: [name] Signed: 03/01/2023 5:05:57 PM By: Midge Aver MSN RN CNS WTA Entered By: [name] on 03/01/2023 11:25:15 -------------------------------------------------------------------------------- Multi Wound Chart Details Patient Name: Date of Service: Heather Jefferson A. 03/01/2023 11:00 A M Medical Record Number: [id number] Patient Account Number: [id number] Date of Birth/Sex: Treating RN: Sep 22, 1976 (47 y.o. 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: Extremity Chronic sinus problems/congestion, Chronic sinus problems/congestion, N/A Comorbid History: Middle ear problems, Anemia, Chronic Middle ear problems, Anemia, Chronic Obstructive Pulmonary Disease Obstructive Pulmonary Disease (COPD), Congestive Heart Failure, (COPD), Congestive Heart Failure, Type II Diabetes, End Stage Renal Type II Diabetes, End Stage Renal Disease, History of pressure wounds, Disease, History of pressure wounds, Neuropathy Neuropathy 12/01/2021 03/16/2020 N/A Date Acquired: 50 50 N/A Weeks of Treatment: Open Open N/A Wound Status: No No N/A Wound Recurrence: 0.2x0.2x0.3 0.7x0.4x0.2 N/A Measurements L x W x D (cm) 0.031 0.22 N/A A (cm) : rea 0.009 0.044 N/A Volume (cm) : 98.30% 95.30% N/A % Reduction in Area: 99.50% 95.30% N/A % Reduction in Volume: Full Thickness Without Exposed Grade 3  N/A Classification: Support Structures Small Small N/A Exudate Amount: Serous Serous N/A Exudate Type: amber amber N/A Exudate Color: Distinct, outline attached Flat and Intact N/A Wound Margin: None Present (0%) Large (67-100%) N/A Granulation Amount: N/A Red, Pink N/A Granulation Quality: Medium (34-66%) Small (1-33%) N/A Necrotic Amount: Fat Layer (Subcutaneous Tissue): Yes Fat Layer (Subcutaneous Tissue): Yes N/A Exposed Structures: Fascia: No Fascia: No Tendon: No Tendon: No Muscle: No Muscle: No Joint: No Joint: No Bone: No Bone: No Medium (34-66%) None N/A Epithelialization: Treatment Notes Electronic Signature(s) Signed: 03/01/2023 4:34:51 PM By: [name] Entered By: [name] on 03/01/2023 11:25:20  MARGARETMARY, SORIA A ([id number]) [id number] Page 4 of 9 -------------------------------------------------------------------------------- Multi-Disciplinary Care Plan Details Patient Name: Date of Service: LILJA, JOST 03/01/2023 11:00 A M Medical Record Number: [id number] Patient Account Number: [id number] Date of Birth/Sex: Treating RN: 11/17/1975 (47 y.o. Ginette Pitman Primary Care Tarvis Blossom: [name] Other Clinician: [name] Referring Kiaan Overholser: Treating Caroleen Stoermer/Extender: RO BSO N, MICHA EL Adela Lank, [name] Weeks in Treatment: 50 Active Inactive Abuse / Safety / Falls / Self Care Management Nursing Diagnoses: History of Falls Potential for falls Potential for injury related to falls Goals: Patient will not develop complications from immobility Date Initiated: 03/17/2022 Date Inactivated: 04/20/2022 Target Resolution Date: 03/16/2022 Goal Status: Met Patient/caregiver will verbalize understanding of skin care regimen Date Initiated: 03/17/2022 Date Inactivated: 09/28/2022 Target Resolution Date: 03/16/2022 Goal Status: Met Patient/caregiver will verbalize/demonstrate measure taken to improve self  care Date Initiated: 03/17/2022 Target Resolution Date: 03/16/2022 Goal Status: Active Interventions: Assess fall risk on admission and as needed Provide education on basic hygiene Provide education on personal and home safety Notes: Necrotic Tissue Nursing Diagnoses: Impaired tissue integrity related to necrotic/devitalized tissue Knowledge deficit related to management of necrotic/devitalized tissue Goals: Necrotic/devitalized tissue will be minimized in the wound bed Date Initiated: 03/17/2022 Target Resolution Date: 03/19/2023 Goal Status: Active Patient/caregiver will verbalize understanding of reason and process for debridement of necrotic tissue Date Initiated: 03/17/2022 Date Inactivated: 09/28/2022 Target Resolution Date: 03/16/2022 Goal Status: Met Interventions: Provide education on necrotic tissue and debridement process Treatment Activities: Apply topical anesthetic as ordered : 03/16/2022 Excisional debridement : 03/16/2022 Notes: Osteomyelitis Nursing Diagnoses: Infection: osteomyelitis Knowledge deficit related to disease process and management Goals: Patient/caregiver will verbalize understanding of disease process and disease management Date Initiated: 03/17/2022 Target Resolution Date: 03/16/2022 Goal Status: Active Patient's osteomyelitis will resolve Date Initiated: 03/17/2022 Target Resolution Date: 10/26/2022 THEORA, DEPRIMO ([id number]) [id number] Page 5 of 9 Goal Status: Active Signs and symptoms for osteomyelitis will be recognized and promptly addressed Date Initiated: 03/17/2022 Target Resolution Date: 10/26/2022 Goal Status: Active Interventions: Assess for signs and symptoms of osteomyelitis resolution every visit Provide education on osteomyelitis Treatment Activities: Systemic antibiotics : 03/16/2022 Notes: Patient on antibiotics. Pressure Nursing Diagnoses: Knowledge deficit related to management of pressures  ulcers Potential for impaired tissue integrity related to pressure, friction, moisture, and shear Goals: Patient will remain free from development of additional pressure ulcers Date Initiated: 11/02/2022 Target Resolution Date: 03/17/2023 Goal Status: Active Patient/caregiver will verbalize risk factors for pressure ulcer development Date Initiated: 11/02/2022 Target Resolution Date: 03/17/2023 Goal Status: Active Patient/caregiver will verbalize understanding of pressure ulcer management Date Initiated: 11/02/2022 Target Resolution Date: 03/17/2023 Goal Status: Active Interventions: Assess: immobility, friction, shearing, incontinence upon admission and as needed Provide education on pressure ulcers Notes: Electronic Signature(s) Signed: 03/01/2023 4:34:51 PM By: [name] Signed: 03/01/2023 5:05:57 PM By: Midge Aver MSN RN CNS WTA Entered By: [name] on 03/01/2023 11:31:10 -------------------------------------------------------------------------------- Pain Assessment Details Patient Name: Date of Service: Heather Jefferson A. 03/01/2023 11:00 A M Medical Record Number: [id number] Patient Account Number: [id number] Date of Birth/Sex: Treating RN: 1976-06-10 (47 y.o. Ginette Pitman Primary Care Saranya Harlin: [name] Other Clinician: [name] Referring Huie Ghuman: Treating Kassi Esteve/Extender: RO BSO N, MICHA EL Adela Lank, [name] Weeks in Treatment: 50 Active Problems Location of Pain Severity and Description of Pain Patient Has Paino No Site Locations Duration of the Pain. DAYSIA, ERRICKSON A ([id number]) [id number] Page 6 of 9 Duration of the Pain. Constant / Intermittento Intermittent Rate the pain. Current Pain  Level: 5 Character of Pain Describe the Pain: Burning Pain Management and Medication Current Pain Management: Medication: Yes Cold Application: No Rest: No Massage: No Activity: No T.E.N.S.: No Heat Application: No Leg drop  or elevation: No Is the Current Pain Management Adequate: Inadequate How does your wound impact your activities of daily livingo Sleep: No Bathing: No Appetite: No Relationship With Others: No Bladder Continence: No Emotions: No Bowel Continence: No Work: No Toileting: No Drive: No Dressing: No Hobbies: No Electronic Signature(s) Signed: 03/01/2023 4:34:51 PM By: [name] Signed: 03/01/2023 5:05:57 PM By: Midge Aver MSN RN CNS WTA Entered By: [name] on 03/01/2023 11:23:38 -------------------------------------------------------------------------------- Patient/Caregiver Education Details Patient Name: Date of Service: [name], Boneta A. 5/8/2024andnbsp11:00 A M Medical Record Number: [id number] Patient Account Number: [id number] Date of Birth/Gender: Treating RN: [birth year] (47 y.o. Ginette Pitman Primary Care Physician: [name] Other Clinician: [name] Referring Physician: Treating Physician/Extender: RO BSO N, MICHA EL Adela Lank, Georgiann Hahn in Treatment: 50 Education Assessment Education Provided To: Patient Education Topics Provided Wound/Skin Impairment: Handouts: Other: continue wound care as directed Methods: Explain/Verbal Responses: State content correctly Electronic Signature(s) Signed: 03/01/2023 4:34:51 PM By: [name] Entered By: [name] on 03/01/2023 11:31:28 Earmon Phoenix A ([id number]) [id number] Page 7 of 9 -------------------------------------------------------------------------------- Wound Assessment Details Patient Name: Date of Service: CAMYRA, CHAMPA 03/01/2023 11:00 A M Medical Record Number: [id number] Patient Account Number: [id number] Date of Birth/Sex: Treating RN: 01-Aug-1976 (47 y.o. Ginette Pitman Primary Care Per Beagley: [name] Other Clinician: [name] Referring Kaijah Abts: Treating Kei Mcelhiney/Extender: RO BSO N, MICHA EL Adela Lank, [name] Weeks in Treatment:  50 Wound Status Wound Number: 11 Primary Open Surgical Wound Etiology: Wound Location: Right, Plantar Foot Wound Open Wounding Event: Surgical Injury Status: Date Acquired: 12/01/2021 Comorbid Chronic sinus problems/congestion, Middle ear problems, Anemia, Weeks Of Treatment: 50 History: Chronic Obstructive Pulmonary Disease (COPD), Congestive Heart Clustered Wound: No Failure, Type II Diabetes, End Stage Renal Disease, History of pressure wounds, Neuropathy Photos Wound Measurements Length: (cm) 0.2 Width: (cm) 0.2 Depth: (cm) 0.3 Area: (cm) 0.031 Volume: (cm) 0.009 % Reduction in Area: 98.3% % Reduction in Volume: 99.5% Epithelialization: Medium (34-66%) Wound Description Classification: Full Thickness Without Exposed Support Structures Wound Margin: Distinct, outline attached Exudate Amount: Small Exudate Type: Serous Exudate Color: amber Foul Odor After Cleansing: No Slough/Fibrino Yes Wound Bed Granulation Amount: None Present (0%) Exposed Structure Necrotic Amount: Medium (34-66%) Fascia Exposed: No Necrotic Quality: Adherent Slough Fat Layer (Subcutaneous Tissue) Exposed: Yes Tendon Exposed: No Muscle Exposed: No Joint Exposed: No Bone Exposed: No Treatment Notes Wound #11 (Foot) Wound Laterality: Plantar, Right Cleanser Wound Cleanser Discharge Instruction: Wash your hands with soap and water. Remove old dressing, discard into plastic bag and place into trash. Cleanse the wound with Wound Cleanser prior to applying a clean dressing using gauze sponges, not tissues or cotton balls. Do not scrub or use excessive force. Pat dry using gauze sponges, not tissue or cotton balls. Heather-Wound Care Topical Gentamicin Discharge Instruction: Apply as directed by Alyvia Derk. in clinic Primary Dressing AquacelAg Advantage Dressing, 2X2 (in/in) DE, MINK A ([id number]) [id number] Page 8 of 9 Discharge Instruction: Apply to wound as  directed Secondary Dressing ABD Pad 5x9 (in/in) Discharge Instruction: Cover with ABD pad Secured With Medipore T - 79M Medipore H Soft Cloth Surgical T ape ape, 2x2 (in/yd) Kerlix Roll Sterile or Non-Sterile 6-ply 4.5x4 (yd/yd) Discharge Instruction: Apply Kerlix as directed Compression Wrap Compression Stockings Add-Ons Electronic Signature(s) Signed: 03/01/2023 4:34:51 PM  ByBetha Loa Signed: 03/01/2023 5:05:57 PM By: Midge Aver MSN RN CNS WTA Entered By: [name] on 03/01/2023 11:24:24 -------------------------------------------------------------------------------- Wound Assessment Details Patient Name: Date of Service: Heather Jefferson A. 03/01/2023 11:00 A M Medical Record Number: [id number] Patient Account Number: [id number] Date of Birth/Sex: Treating RN: 09/22/1976 (47 y.o. Ginette Pitman Primary Care Justyna Timoney: [name] Other Clinician: [name] Referring Jaion Lagrange: Treating Markayla Reichart/Extender: RO BSO N, MICHA EL Adela Lank, [name] Weeks in Treatment: 50 Wound Status Wound Number: 12 Primary Diabetic Wound/Ulcer of the Lower Extremity Etiology: Wound Location: Left, Medial, Plantar Foot Wound Open Wounding Event: Pressure Injury Status: Date Acquired: 03/16/2020 Comorbid Chronic sinus problems/congestion, Middle ear problems, Anemia, Weeks Of Treatment: 50 History: Chronic Obstructive Pulmonary Disease (COPD), Congestive Heart Clustered Wound: No Failure, Type II Diabetes, End Stage Renal Disease, History of pressure wounds, Neuropathy Photos Wound Measurements Length: (cm) 0.7 Width: (cm) 0.4 Depth: (cm) 0.2 Area: (cm) 0.22 Volume: (cm) 0.044 % Reduction in Area: 95.3% % Reduction in Volume: 95.3% Epithelialization: None Wound Description Classification: Grade 3 Wound Margin: Flat and Intact Exudate Amount: Small Exudate Type: Serous Exudate Color: amber Foul Odor After Cleansing: No Slough/Fibrino No Wound Bed Granulation Amount:  Large (67-100%) Exposed Structure Granulation Quality: Red, Pink Fascia Exposed: No Dinino, Sharie A ([id number]) [id number] Page 9 of 9 Necrotic Amount: Small (1-33%) Fat Layer (Subcutaneous Tissue) Exposed: Yes Necrotic Quality: Adherent Slough Tendon Exposed: No Muscle Exposed: No Joint Exposed: No Bone Exposed: No Treatment Notes Wound #12 (Foot) Wound Laterality: Plantar, Left, Medial Cleanser Wound Cleanser Discharge Instruction: Wash your hands with soap and water. Remove old dressing, discard into plastic bag and place into trash. Cleanse the wound with Wound Cleanser prior to applying a clean dressing using gauze sponges, not tissues or cotton balls. Do not scrub or use excessive force. Pat dry using gauze sponges, not tissue or cotton balls. Heather-Wound Care Topical Primary Dressing Hydrofera Blue Ready Transfer Foam, 2.5x2.5 (in/in) Discharge Instruction: Apply Hydrofera Blue Ready to wound bed as directed Secondary Dressing ABD Pad 5x9 (in/in) Discharge Instruction: Cover with ABD pad Secured With Medipore T - 35M Medipore H Soft Cloth Surgical T ape ape, 2x2 (in/yd) Kerlix Roll Sterile or Non-Sterile 6-ply 4.5x4 (yd/yd) Discharge Instruction: Apply Kerlix as directed Compression Wrap Compression Stockings Add-Ons Electronic Signature(s) Signed: 03/01/2023 4:34:51 PM By: [name] Signed: 03/01/2023 5:05:57 PM By: Midge Aver MSN RN CNS WTA Entered By: [name] on 03/01/2023 11:25:00 -------------------------------------------------------------------------------- Vitals Details Patient Name: Date of Service: [name], Manessa A. 03/01/2023 11:00 A M Medical Record Number: [id number] Patient Account Number: [id number] Date of Birth/Sex: Treating RN: 1976/05/28 (47 y.o. Ginette Pitman Primary Care Cedar Ditullio: [name] Other Clinician: [name] Referring Arnell Mausolf: Treating Early Steel/Extender: RO BSO N, MICHA EL Adela Lank,  [name] Weeks in Treatment: 50 Vital Signs Time Taken: 11:11 Temperature (F): 98.4 Height (in): 69 Pulse (bpm): 67 Weight (lbs): 178 Respiratory Rate (breaths/min): 16 Body Mass Index (BMI): 26.3 Blood Pressure (mmHg): 110/73 Reference Range: 80 - 120 mg / dl Electronic Signature(s) Signed: 03/01/2023 4:34:51 PM By: [name] Entered By: [name] on 03/01/2023 11:13:10

## 2023-03-02 NOTE — Progress Notes (Signed)
WILLOE, KERWICK A ([id number]) [id number] Page 1 of 17 Visit Report for 03/01/2023 HPI Details Patient Name: Date of Service: KARLETTA, PRUDEN 03/01/2023 11:00 A M Medical Record Number: [id number] Patient Account Number: [id number] Date of Birth/Sex: Treating RN: 01-01-1976 (47 y.o. Ginette Pitman Primary Care Provider: [name] Other Clinician: [name] Referring Provider: Treating Provider/Extender: RO BSO N, MICHA EL Adela Lank, [name] Weeks in Treatment: 50 History of Present Illness HPI Description: 01/18/18-She is here for initial evaluation of the left great toe ulcer. She is a poor historian in regards to timeframe in detail. She states approximately 4 weeks ago she lacerated her toe on something in the house. She followed up with her primary care who placed her on Bactrim and ultimately a second dose of Bactrim prior to coming to wound clinic. She states she has been treating the toe with peroxide, Betadine and a Band-Aid. She did not check her blood sugar this morning but checked it yesterday morning it was 327; she is unaware of a recent A1c and there are no current records. She saw Dr. she would've orthopedics last week for an old injury to the left ankle, she states he did not see her toe, nor did she bring it to his attention. She smokes approximately 1 pack cigarettes a day. Her social situation is concerning, she arrives this morning with her mother who appears extremely intoxicated/under the influence; her mother was asked to leave the room and be monitored by the patient's grandmother. The patient's aunt then accompanied the patient and the room throughout the rest of the appointment. We had a lengthy discussion regarding the deleterious effects of uncontrolled hyperglycemia and smoking as it relates to wound healing and overall health. She was strongly encouraged to decrease her smoking and get her diabetes under better control. She states  she is currently on a diet and has cut down her [facility] consumption. The left toe is erythematous, macerated and slightly edematous with malodor present. The edema in her left foot is below her baseline, there is no erythema streaking. We will treat her with Santyl, doxycycline; we have ordered and xray, culture and provided a Peg assist surgical shoe and cultured the wound. 01/25/18-She is here in follow-up evaluation for a left great toe ulcer and presents with an abscess to her suprapubic area. She states her blood sugars remain elevated, feeling "sick" and if levels are below 250, but she is trying. She has made no attempt to decrease her smoking stating that we "can't take away her food in her cigarettes". She has been compliant with offloading using the PEG assist you. She is using Santyl daily. the culture obtained last week grew staph aureus and Enterococcus faecalis; continues on the doxycycline and Augmentin was added on Monday. The suprapubic area has erythema, no femoral variation, purple discoloration, minimal induration, was accessed with a cotton tip applicator with sanguinopurulent drainage, this was cultured, I suspect the current antibiotic treatment will cover and we will not add anything to her current treatment plan. She was advised to go to urgent care or ER with any change in redness, induration or fever. 02/01/18-She is here in follow-up evaluation for left great toe ulcers and a new abdominal abscess from last week. She was able to use packing until earlier this week, where she "forgot it was there". She states she was feeling ill with GI symptoms last week and was not taking her antibiotic. She states her glucose levels have been predominantly less  than 200, with occasional levels between 200-250. She thinks this was contributing to her GI symptoms as they have resolved without intervention. There continues to be significant laceration to left toe, otherwise it clinically  looks stable/improved. There is now less superficial opening to the lateral aspect of the great toe that was residual blister. We will transition to [facility] to all wounds, she will continue her Augmentin. If there is no change or deterioration next week for reculture. 02/08/18-She is here in follow-up evaluation for left great toe ulcer and abdominal ulcer. There is an improvement in both wounds. She has been wrapping her left toe with coban, not by our direction, which has created an area of discoloration to the medial aspect; she has been advised to NOT use coban secondary to her neuropathy. She states her glucose levels have been high over this last week ranging from 200-350, she continues to smoke. She admits to being less compliant with her offloading shoe. We will continue with same treatment plan and she will follow-up next week. 02/15/18-She is here in follow-up evaluation for left great toe ulcer and abdominal ulcer. The abdominal ulcer is epithelialized. The left great toe ulcer is improved and all injury from last week using the Coban wrap is resolved, the lateral ulcer is healed. She admits to noncompliance with wearing offloading shoe and admits to glucose levels being greater than 300 most of the week. She continues to smoke and expresses no desire to quit. There is one area medially that probes deeper than it has historically, erythema to the toe and dorsal foot has consistently waxed and waned. There is no overt signs of cellulitis or infection but we will culture the wound for any occult infection given the new area of depth and erythema. We will hold off on sensitivities for initiation of antibiotic therapy. 02/22/18-She is here in follow up evaluation for left great toe ulcer. There is overall significant improvement in both wound appearance, erythema and edema with changes made last week. She was not initiated on antibiotic therapy. Culture obtained last week showed oxacillin  sensitive staph aureus, sensitive to clindamycin. Clindamycin has been called into the pharmacy but she has been instructed to hold off on initiation secondary to overall clinical improvement and her history of antibiotic intolerance. She has been instructed to contact the clinic with any noted changes/deterioration and the wound, erythema, edema and/or pain. She will follow-up next week. She continues to smoke and her glucose levels remain elevated >250; she admits to compliance with offloading shoe 03/01/18 on evaluation today patient appears to be doing fairly well in regard to her left first toe ulcer. She has been tolerating the dressing changes with the [facility] Dressing without complication and overall this has definitely showed signs of improvement according to records as well is what the patient tells me today. I'm very pleased in that regard. She is having no pain today 03/08/18 She is here for follow up evaluation of a left great toe ulcer. She remains non-compliant with glucose control and smoking cessation; glucose levels consistently >200. She states that she got new shoe inserts/peg assist. She admits to compliance with offloading. Since my last evaluation there is significant improvement. We will switch to prisma at this time and she will follow up next week. She is noted to be tachycardic at this appointment, heart rate 120s; she has a history of heart rate 70-130 according to our records. She admits to extreme agitation r/t personal issues; she was advised  to monitor her heartrate and contact her physician if it does not return to a more normal range (<100). She takes cardizem twice daily. 03/15/18-She is here in follow-up evaluation for left great toe ulcer. She remains noncompliant with glucose control and smoking cessation. She admits to compliance with wearing offloading shoe. The ulcer is improved/stable and we will continue with the same treatment plan and she will follow-up  next week 03/22/18-She is here for evaluation for left great toe ulcer. There continues to be significant improvement despite recurrent hyperglycemia (over 500 yesterday) and she continues to smoke. She has been compliant with offloading and we will continue with same treatment plan and she will follow-up next week. 03/29/18-She is here for evaluation for left great toe ulcer. Despite continuing to smoke and uncontrolled diabetes she continues to improve. She is compliant with offloading shoe. We will continue with the same treatment plan and she will follow-up next week 04/05/18- She is here in follow up evaluation for a left great toe ulcer; she presents with small pustule to left fifth toe (resembles ant bite). She admits to compliance with wearing offloading shoe; continues to smoke or have uncontrolled blood glucose control. There is more callus than usual with evidence of bleeding; she denies known trauma. 04/12/18-She is here for evaluation of left great toe ulcer. Despite noncompliance with glycemic control and smoking she continues to make improvement. She continues to wear offloading shoe. The pustule, that was identified last week, to the left fifth toe is resolved. She will follow-up in 2 weeks 05/03/18-she is seen in follow-up evaluation for a left great toe ulcer. She is compliant with offloading, otherwise noncompliant with glycemic control and smoking. She has plateaued and there is minimal improvement noted. We will transition to [facility], replaced the insert to her surgical shoe and she will follow-up in one week 05/10/18- She is here in follow up evaluation for a left great toe ulcer. It appears stable despite measurement change. We will continue with same treatment plan and follow up next week. 05/24/18-She is seen in follow-up evaluation for a left great toe ulcer. She remains compliant with offloading, has made significant improvement in her diet, decreasing the amount of  sugar/soda. She said her recent A1c was 10.9 which is lower than. She did see a diabetic nutritionist/educator yesterday. She continues to smoke. We will continue with the same treatment plan and she'll follow-up next week. 05/31/18- She is seen in follow-up evaluation for left great toe ulcer. She continues to remain compliant with offloading, continues to make improvement in her diet, increasing her water and decreasing the amount of sugar/soda. She does continue to smoke with no desire to quit. We will apply Prisma to the depth and Hydrofera Blue over. We have not received insurance authorization for oasis. She will follow up next week. MONSERRATT, GERSHMAN A ([id number]) [id number] Page 2 of 17 06/07/18-She is seen in follow-up evaluation for left great toe ulcer. It has stalled according to today's measurements although base appears stable. She says she saw a diabetic educator yesterday; her average blood sugars are less than 300 which is an improvement for her. She continues to smoke and states "that's my next step" She continues with water over soda. We will order for xray, culture and reinstate ace wrap compression prior to placing apligraf for next week. She is voicing no complaints or concerns. Her dressing will change to iodoflex over the next week in preparation for apligraf. 06/14/18-She is seen in follow-up evaluation for  left great toe ulcer. Plain film x-ray performed last week was negative for osteomyelitis. Wound culture obtained last week grew strep B and OSSA; she is initiated on keflex and cefdinir today; there is erythema to the toe which could be from ace wrap compression, she has a history of wrapping too tight and has has been encouraged to maintain ace wraps that we place today. We will hold off on application of apligraf today, will apply next week after antibiotic therapy has been initiated. She admits today that she has resumed taking a shower with her  foot/toe submerged in water, she has been reminded to keep foot/toe out of the bath water. She will be seen in follow up next week 06/21/18-she is seen in follow-up evaluation for left great toe ulcer. She is tolerating antibiotic therapy with no GI disturbance. The wound is stable. Apligraf was applied today. She has been decreasing her smoking, only had 4 cigarettes yesterday and 1 today. She continues being more compliant in diabetic diet. She will follow-up next week for evaluation of site, if stable will remove at 2 weeks. 06/28/18- She is here in follow up evalution. Apligraf was placed last week, she states the dressing fell off on Tuesday and she was dressing with hydrofera blue. She is healed and will be discharged from the clinic today. She has been instructed to continue with smoking cessation, continue monitoring glucose levels, offloading for an additional 4 weeks and continue with hydrofera blue for additional two weeks for any possible microscopic opening. Readmission: 08/07/18 on evaluation today patient presents for reevaluation concerning the ulcer of her right great toe. She was previously discharged on 06/28/18 healed. Nonetheless she states that this began to show signs of drainage she subsequently went to her primary care provider. Subsequently an x-ray was performed on 08/01/18 which was negative. The patient was also placed on antibiotics at that time. Fortunately they should have been effective for the infection. Nonetheless she's been experiencing some improvement but still has a lot of drainage coming from the wound itself. 08/14/18 on evaluation today patient's wound actually does show signs of improvement in regard to the erythema at this point. She has completed the antibiotics. With that being said we did discuss the possibility of placing her in a total contact cast as of today although I think that I may want to give this just a little bit more time to ensure nothing  recurrence as far as her infection is concerned. I do not want to put in the cast and risk infection at that time if things are not completely resolved. With that being said she is gonna require some debridement today. 08/21/18 on evaluation today patient actually appears to be doing okay in regard to her toe ulcer. She's been tolerating the dressing changes without complication. With that being said it does appear that she is ready and in fact I think it's appropriate for [place] to go ahead and initiate the total contact cast today. Nonetheless she will require some sharp debridement to prepare the wound for application. Overall I feel like things have been progressing well but we do need to do something to get this to close more readily. 08/24/18 patient seen today for reevaluation after having had the total contact cast applied on Tuesday. She seems to have done very well the wound appears to be doing great and overall I'm pleased with the progress that she's made. There were no abnormal areas of rubbing from the cast on her lower  extremity. 08/30/18 on evaluation today patient actually appears to be completely healed in regard to her plantar toe ulcer. She tells me at this point she's been having a lot of issues with the cast. She almost fell a couple of times the state shall the step of her dog a couple times as well. This is been a very frustrating process for her other nonetheless she has completely healed the wound which is excellent news. Overall there does not appear to be the evidence of infection at this time which is great news. 09/11/18 evaluation today patient presents for follow-up concerning her great toe ulcer on the left which has unfortunately reopened since I last saw her which was only a couple of weeks ago. Unfortunately she was not able to get in to get the shoe and potentially the AFO that's gonna be necessary due to her left foot drop. She continues with offloading shoe but this is  not enough to prevent her from reopening it appears. When we last had her in the total contact cast she did well from a healing standpoint but unfortunately the wound reopened as soon as she came out of the cast within just a couple of weeks. 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 This is also part of the reason why she falls on a regular basis. I just do not believe that is gonna be safe for [place] to reinitiate the total contact cast as last time we had this on she fell 3 times one week which is definitely not normal for her. 09/18/18 upon evaluation today the patient actually appears to be doing about the same in regard to her toe ulcer. She did not contact [employer] as I asked her to even though I had given her the prescription. In fact she actually states that she has no idea where the prescription is. She did apparently call [employer] and they told her that all she needed to do was bring the prescription in order to be able to be seen and work on getting the AFO for her left foot. With all that being said she still does not have an appointment and I'm not sure were things stand that regard. I will give her a new prescription today in order to contact them to get this set up. 09/25/18 on evaluation today patient actually appears to be doing about the same in regard to her toes ulcer. She does have a small areas which seems to have a lot of callous buildup around the edge of the wound which is going to need sharp debridement today. She still is waiting to be scheduled for evaluation with [employer] for possibility of an AFO. She states there supposed to call her tomorrow to get this set up. Unfortunately it does appear that her foot specifically the toe area is showing signs of erythema. There does not appear to  be any systemic infection which is in these good news. 10/02/18 on evaluation today patient actually appears to be doing about the same in regard to her toe ulcer. This really has not done too well although it's not significantly larger it's also not significantly smaller. She has been tolerating the dressing changes without complication. She actually has her appointment with [employer] and [facility] tomorrow to hopefully be  measured for obtaining and AFO splint. I think this would be helpful preventing this from reoccurring. We had contemplated starting the cast this week although to be honest I am reluctant to do that as she's been having nausea, vomiting, and seizure activity over the past three days. She has a history of seizures and have been told is nothing that can be done for these. With that being said I do believe that along with the seizures have the nausea vomiting which upon further questioning doesn't seem to be the normal for her and makes me concerned for the possibility of infection or something else going on. I discussed this with the patient and her mother during the office visit today. I do not feel the wound is effective but maybe something else. The responses this was "this just happens to her at times and we don't know why". They did not seem to be interested in going to the hospital to have this checked out further. 10/09/18 on evaluation today patient presents for follow-up concerning her ongoing toe ulcer. She has been tolerating the dressing changes without complication. Fortunately there does not appear to be any evidence of infection which is great news however I do think that the patient would benefit from going ahead for with the total contact cast. She's actually in a wheelchair today she tells me that she will use her walker if we initiate the cast. I was very specific about the fact that if we were gonna do the cast I wanted to make sure that she was using the walker in  order to prevent any falls. She tells me she does not have stairs that she has to traverse on a regular basis at her home. She has not had any seizures since last week again that something that happens to her often she tells me she did talk to [employer] and they said that it may take up to three weeks to get the brace approved for her. Hopefully that will not take that long but nonetheless in the meantime I do think the cast could be of benefit. 10/12/18 on evaluation today patient appears to be doing rather well in regard to her toe ulcer. It's just been a few days and already this is significantly improved both as far as overall appearance and size. Fortunately there's no sign of infection. She is here for her first obligatory cast change. 10/19/18 Seen today for follow up and management of left great toe ulcer. Wound continues to show improvement. Noted small open area with seroussang drainage with palpation. Denies any increased pain or recent fevers during visit. She will continue calcium alginate with offloading shoe. Denies any questions or concerns during visit. 10/26/18 on evaluation today patient appears to be doing about the same as when I last saw her in regard to her wound bed. Fortunately there does not appear to be any signs of infection. Unfortunately she continues to have a breakdown in regard to the toe region any time that she is not in the cast. It takes almost no time at all for this to happen. Nonetheless she still has not heard anything from the brace being made by [employer] as to when exactly this will be available to her. Fortunately there is no signs of infection at this time. 10/30/18 on evaluation today patient presents for application of the total contact cast as we just received him this morning. Fortunately we are gonna be able to KAYORI, HOLZMANN A ([id number]) [id number] Page 3 of  17 apply this to her today which is great news. She continues to have  no significant pain which is good news. Overall I do feel like things have been improving while she was the cast is when she doesn't have a cast that things get worse. She still has not really heard anything from [employer] regarding her brace. 11/02/18 upon evaluation today patient's wound already appears to be doing significantly better which is good news. Fortunately there does not appear to be any signs of infection also good news. Overall I do think the total contact cast as before is helping to heal this area unfortunately it's just not gonna likely keep the area closed and healed without her getting her brace at least. Again the foot drop is a significant issue for her. 11/09/18 on evaluation today patient appears to be doing excellent in regard to her toe ulcer which in fact is completely healed. Fortunately we finally got the situation squared away with the paperwork which was needed to proceed with getting her brace approved by Medicaid. I have filled that out unfortunately that information has been sent to the orthopedic office that I worked at 2 1/2 years ago and not tired Current wound care measures. Fortunately she seems to be doing very well at this time. 11/23/18 on evaluation today patient appears to be doing More Poorly Compared to Last Time I Saw Her. At [facility] She Had Completely Healed. Currently she is continuing to have issues with reopening. She states that she just found out that the brace was approved through Medicaid now she just has to go get measured in order to have this fitted for her and then made. Subsequently she does not have an appointment for this yet that is going to complicate things we obviously cannot put her back in the cast if we do not have everything measured because they're not gonna be able to measure her foot while she is in the cast. Unfortunately the other thing that I found out today as well is that she was in the hospital over the weekend due to having a  heroin overdose. Obviously this is unfortunate and does have me somewhat worried as well. 11/30/18 on evaluation today patient's toe ulcer actually appears to be doing fairly well. The good news is she will be getting her brace in the shoes next week on Wednesday. Hopefully we will be able to get this to heal without having to go back in the cast however she may need the cast in order to get the wound completely heal and then go from there. Fortunately there's no signs of infection at this time. 12/07/18 on evaluation today patient fortunately did receive her brace and she states she could tell this definitely makes her walk better. With that being said she's been having issues with her toe where she noticed yesterday there was a lot of tissue that was loosing off this appears to be much larger than what it was previous. She also states that her leg has been read putting much across the top of her foot just about the ankle although this seems to be receiving somewhat. The total area is still red and appears to be someone infected as best I can tell. She is previously taken Bactrim and that may be a good option for her today as well. We are gonna see what I wound culture shows as well and I think that this is definitely appropriate. With that being said outside of the culture  I still need to initiate something in the interim and that's what I'm gonna go ahead and select Bactrim is a good option for her. 12/14/18 on evaluation today patient appears to be doing better in regard to her left great toe ulcer as compared to last week's evaluation. There's still some erythema although this is significantly improved which is excellent news. Overall I do believe that she is making good progress is still gonna take some time before she is where I would like her to be from the standpoint of being able to place her back into the total contact cast. Hopefully we will be where we need to be by next week. 12/21/18 on  evaluation today patient actually appears to be doing poorly in regard to her toe ulcer. She's been tolerating the dressing changes without complication. Fortunately there's no signs of systemic infection although she does have a lot of drainage from the toe ulcer and this does seem to be causing some issues at this point. She does have erythema on the distal portion of her toe that appears to be likely cellulitis. 12/28/18 on evaluation today patient actually appears to be doing a little better in my pinion in regard to her toe ulcer. With that being said she still does have some evidence of infection at this time and for her culture she had both E. coli as well as enterococcus as organisms noted on evaluation. For that reason I think that though the Keflex likely has treated the E. coli rather well this has really done nothing for the enterococcus. We are going to have to initiate treatment for this specifically. 01/04/19 on evaluation today patient's toe actually appears to be doing better from the standpoint of infection. She currently would like to see about putting the cash back on I think that this is appropriate as long as she takes care of it and keeps it from getting wet. She is gonna have some drainage we can definitely pass this up with Drawtex and alginate to try to prevent as much drainage as possible from causing the problems. With that being said I do want to at least try her with the cast between now and Tuesday. If there any issues we can't continue to use it then I will discontinue the use of the cast at that point. 01/08/19 on evaluation today patient actually appears to be doing very well as far as her foot ulcer specifically the great toe on the left is concerned. She did have an area of rubbing on the medial aspect of her left ankle which again is from the cast. Fortunately there's no signs of infection at this point in this appears to be a very slight skin breakdown. The patient  tells me she felt it rubbing but didn't think it was that bad. Fortunately there is no signs of active infection at this time which is good news. No fevers, chills, nausea, or vomiting noted at this time. 01/15/19 on evaluation today patient actually appears to be doing well in regard to her toe ulcer. Again as previous she seems to do well and she has the cast on which indicates to me that during the time she doesn't have a cast on she's putting way too much pressure on this region. Obviously I think that's gonna be an issue as with the current national emergency concerning the Covid-19 Virus it has been recommended that we discontinue the use of total contact casting by the chief medical officer of our company,  Dr. Maurine Minister. The reasoning is that if a patient becomes sick and cannot come into have the cast removed they could not just leave this on for an additional two weeks. Obviously the hospitals also do not want to receive patient's who are sick into the emergency department to potentially contaminate the region and spread the Covid-19 Virus among other sick individuals within the hospital system. Therefore at this point we are suspending the use of total contact cast until the current emergency subsides. This was all discussed with the patient today as well. 01/22/19 on evaluation today patient's wound on her left great toe appears to be doing slightly worse than previously noted last week. She tells me that she has been on this quite a bit in fact she tells me she's been awake for 38 straight hours. This is due to the fact that she's having to care for grandparents because nobody else will. She has been taking care of them for five the last seven days since I've seen her they both have dementia his is from a stroke and her grandmother's was progressive. Nonetheless she states even her mom who knows her condition and situation has only help two of those days to take care of them she's been taking  care of the rest. Fortunately there does not appear to be any signs of active infection in regard to her toe at this point although obviously it doesn't look as good as it did previous. I think this is directly related to her not taking off the pressure and friction by way of taking things easy. Though I completely understand what's going on. 01/29/19 on evaluation today patient's tools are actually appears to be showing some signs of improvement today compared to last week's evaluation as far as not necessarily the overall size of the wound but the fact that she has some new skin growth in between the two ends of the wound opening. Overall I feel like she has done well she states that she had a family member give her what sounds to be a CAM walker boot which has been helpful as well. 02/05/19 on evaluation today patient's wound bed actually appears to be doing significantly better in regard to her overall appearance of the size of the wound. With that being said she is still having an issue with offloading efficiently enough to get this to close. Apparently there is some signs of infection at this point as well unfortunately. Previously she's done well of Augmentin I really do not see anything that needs to be culture currently but there theme and cellulitis of the foot that I'm seeing I'm gonna go ahead and place her on an antibiotic today to try to help clear this up. 02/12/2019 on evaluation today patient actually appears to be doing poorly in regard to her overall wound status. She tells me she has been using her offloading shoe but actually comes in today wearing her tennis shoe with the AFO brace. Again as I previously discussed with her this is really not sufficient to allow the area to heal appropriately. Nonetheless she continues to be somewhat noncompliant and I do wonder based on what she has told my nurse in the past as to whether or not a good portion of this noncompliance may be recreational  drug and alcohol related. She has had a history of heroin overdose and this was fairly recently in the past couple of months that have been seeing her. Nonetheless overall I feel like her wound  looks significantly worse today compared to what it was previous. She still has significant erythema despite the Augmentin I am not sure that this is an appropriate medication for her infection I am also concerned that the infection may have gone down into her bone. 02/19/19 on evaluation today patient actually appears to be doing about the same in regard to her toe ulcer. Unfortunately she continues to show signs of bone exposure and infection at this point. There does not appear to be any evidence of worsening of the infection but I'm also not really sure that it's getting significantly better. She is on the Augmentin which should be sufficient for the Staphylococcus aureus infection that she has at this point. With that being said she may need IV antibiotics to more appropriately treat this. We did have a discussion today about hyperbaric option therapy. VIANKA, YLITALO A ([id number]) [id number] Page 4 of 17 02/28/19 on evaluation today patient actually appears to be doing much worse in regard to the wound on her left great toe as compared to even my previous evaluation last week. Unfortunately this seems to be training in a pretty poor direction. Her toe was actually now starting to angle laterally and I can actually see the entire joint area of the proximal portion of the digit where is the distal portion of the digit again is no longer even in contact with the joint line. Unfortunately there's a lot more necrotic tissue around the edge and the toe appears to be showing signs of becoming gangrenous in my pinion. I'm very concerned about were things stand at this point. She did see infectious disease and they are planning to send in a prescription for Sivextro for her and apparently  this has been approved. With that being said I don't think she should avoid taking this but at the same time I'm not sure that it's gonna be sufficient to save her toe at this point. 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 03/05/19 on evaluation today patient actually appears to be doing quite well in regard to her toe all things considering. She still has bone exposed but there appears to be much less your thing on overall the appearance of the wound and the toe itself is dramatically improved. She still does have some issues currently obviously with infection she did see vascular as well and there concerned that her blood flow to the toad. For that reason they are setting up for an angiogram next week. 03/14/19 on evaluation today patient appears to be doing very poor in regard to her toe and specifically in regard to the ulceration and the fact that she's starting to notice the toe was leaning even more towards the lateral aspect and the complete joint is visible on the proximal aspect of the joint. Nonetheless she's also noted a significant odor and the tip of the toe is turning more dark and necrotic appearing. Overall I think she is getting worse not better as far as this is concerned. For that reason I am recommending at this point that she likely needs to be seen for likely amputation. READMISSION 03/19/2021 This is a patient that we cared for in this clinic for a prolonged period of time in 2019 and 2020 with a left foot and  left first toe wound. 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 Excell Seltzer. In December 2021 she had an ulcer on her right great toe as well as the  fourth and fifth toes. 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 She also had an angiogram at that time and underwent angioplasty of the right anterior tibial artery. In any case she claims that the wound on the right foot is closed I did not look at this today which was probably an oversight although I think that should be done next week. After her surgery she developed a dehiscence but I do not see any follow-up of this. According to Dr. Bernette Redbird last review that she was out of the area being cared for by another physician but recently came back to his attention. 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 A culture of this area showed E. coli apparently before she came back to see Dr. Excell Seltzer she was supposed to be receiving antibiotics but she did not really take them. Nor is she offloading this area at all. Finally her last hemoglobin A1c listed in epic was in March 2022 at 14.1 she says things are a lot better since then although I am not sure. 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 She had this wound at the time. During this admission she had E. coli sepsis a TEE was negative. 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 She is still a heavy cigarette smoker. She has a history of bipolar disorder seizure disorder ABI in our clinic was 1.05 6/1; left midfoot in the setting of a TMA done previously. Round circular wound with a "knuckle" of protruding tissue. The problem is that the knuckle was not attached to any of the surrounding granulation and this probed proximally widely I removed a large portion of this  tissue. This wound goes with considerable undermining laterally. I do not feel any bone there was no purulence but this is a deep wound. 6/8; in spite of the debridement I did last week. She arrives with a wound looking exactly the same. A protruding "knuckle" of tissue nonadherent to most of the surrounding tissue. There is considerable depth around this from 6-12 o'clock at 2.7 cm and undermining of 1 cm. This does not look overtly infected and the x- ray I did last week was negative for any osseous abnormalities. We have been using silver collagen 6/15; deep tissue culture I did last week showed moderate staph aureus and moderate Pseudomonas. This will definitely require prolonged antibiotic therapy. The pathology on the protuberant area was negative for malignancy fungus etc. 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 We have been using silver collagen. I am going to be prescribing Levaquin for 2 weeks. Her CT scan of the foot is down for 7/5 6/22; CT scan of the foot on 7 5. She says she has hardware in the left leg from her previous fracture. She is on the Levaquin for the deep tissue culture I did that showed methicillin sensitive staph aureus  and Pseudomonas. I gave her a 2-week supply and she will have another week. She arrives in clinic today with the same protuberant tissue however this is nonadherent to the tissue surrounding it. I am really at a loss to explain this unless there is underlying deep tissue infection 6/29; patient presents for 1 week follow-up. She has been using collagen to the wound bed. She reports taking her antibiotics as prescribed.She has no complaints or issues today. She denies signs of infection. 7/6; patient presents for one week followup. She has been using collagen to the wound bed. She states she is taking Levaquin however at times she is not able to keep it down. She denies signs of infection. 7/13;  patient presents for 1 week follow-up. She has been using silver alginate to the wound bed. She still has nausea when taking her antibiotics. She denies signs of infection. 7/20; patient presents for 1 week follow-up. She has been using silver alginate with gentamicin cream to the wound bed. She denies any issues and has no complaints today. She denies signs of infection. 7/27; patient presents for 1 week follow-up. She continues to use silver alginate with gentamicin cream to the wound bed. She reports starting her antibiotics. She has no issues or complaints. Overall she reports stability to the wound. 8/3; patient presents for 1 week follow-up. She has been using silver alginate with gentamicin cream to the wound bed. She reports completing all antibiotics. She has no issues or complaints today. She denies signs of infection. 8/17; patient presents for 2-week follow-up. He is to use silver alginate to the wound bed. She has no issues or complaints today. She denies signs of infection. She reports her pain has improved in her foot since last clinic visit 8/24; patient presents for 1 week follow-up. She continues to use silver alginate to the wound bed. She has no issues or complaints. She denies signs of infection. Pain is stable. 9/7; patient presents for follow-up. She missed her last week appointment due to feeling ill. She continues to use silver alginate. She has a new wound to the right lower extremity that is covered in eschar. She states It occurred over the past week and has no idea how it started. She currently denies signs of infection. 9/14; patient presents for follow-up. T the left foot wound she has been using gentamicin cream and silver alginate. T the right lower extremity wound she has o o been keeping this covered and has not obtain Santyl. 9/21; patient presents for follow-up. She reports using gentamicin cream and silver alginate to the left foot and Santyl to the right lower  extremity wound. She has no issues or complaints today. She denies signs of infection. 9/28; patient presents for follow-up. She reports a new wound to her right heel. She states this occurred a few days ago and is progressively gotten worse. She has been trying to clean the area with a Q-tip and Santyl. She reports stability in the other 2 wounds. She has been using gentamicin cream and silver alginate to the left foot and Santyl to the right lower extremity wound. 10/12; patient presents for follow-up. She reports improvement to the wound beds. She is seeing vein and vascular to discuss the potential of a left BKA. She states they are going to do an arteriogram. She continues to use silver alginate with dressing changes to her wounds. 11/2; patient presents for follow-up. She states she has not been doing dressing changes to the wound beds.  She states she is not able to offload the areas. She reports chronic pain to her left foot wound. 11/9; patient presents for follow-up. She came in with only socks on. She states she forgot to put on shoes. It is unclear if she is doing any dressing changes. She currently denies systemic signs of infection. 11/16; patient presents for follow-up. She came again only with socks on. She states she does not wear shoes ever. It is unclear if she does dressing changes. ONEAL, GERBITZ A ([id number]) [id number] Page 5 of 17 She currently denies systemic signs of infection. 11/23; patient presents for follow-up. She wore her shoes today. It still unclear exactly what dressing she is using for each wound but she did states she obtained Dakin's solution and has been using this to the left foot wound. She currently denies signs of infection. 11/30; patient presents for follow-up. She has no issues or complaints today. She currently denies signs of infection. 12/7; patient presents for follow-up. She has no issues or complaints today. She has  been using Hydrofera Blue to the right heel wound and Dakin solution to the left foot wound. Her right anterior leg wound is healed. She currently denies signs of infection. 12/14; patient presents for follow-up. She has been using Hydrofera Blue to the right heel and Dakin's to the left foot wounds. She has no issues or complaints today. She denies signs of infection. 12/21; patient presents for follow-up. She reports using Hydrofera Blue to the right heel and Dakin's to the left foot wound. She denies signs of infection. 12/28; patient presents for follow-up. She continues to use Dakin's to the left foot wound and Hydrofera Blue to the right heel wound. She denies signs of infection. 1/4; patient presents for follow-up. She has no issues or complaints today. She denies signs of infection. 1/11; patient presents for follow-up. It is unclear if she has been dressing these wounds over the past week. She currently denies signs of infection. 1/18; patient presents for follow-up. She states she has been using Dakin's wet-to-dry dressings to the left foot. She has been using Hydrofera Blue to the right foot foot wound. She states that the anterior right leg wound has reopened and draining serous fluid. She denies signs of infection. 1/25; patient presents for follow-up. She has no issues or complaints today. 2/1; patient presents for follow-up. She has no issues or complaints today. She denies signs of infection. 2/8; patient presents for follow-up. She has lost her surgical shoes. She did not have a dressing to the right heel wound. She currently denies signs of infection. 2/15; patient presents for follow-up. She reports more pain to the right heel today. She denies purulent drainage Or fever/chills 2/22; patient presents for follow-up. She reports taking clindamycin over the past week. She states that she continues to have pain to her right heel. She reports purulent drainage. Readmission  03/16/2022 Ms. Timber Lickert is a 47 year old female with a past medical history of type 2 diabetes, osteomyelitis to her feet, chronic systolic heart failure and bipolar disorder that presents to the clinic for bilateral feet wounds and right lower extremity wound. She was last seen in our clinic on 12/15/2021. At that time she had purulent drainage coming out of her right plantar foot and I recommended she go to the ED. She states she went to [facility] and has been there for the past 3 months. I cannot see the records. She states she had OR debridement and was on  several weeks of IV antibiotics while inpatient. Since discharge she has not been taking care of the wound beds. She had nothing on her feet other than socks today. She currently denies signs of infection. 5/31; patient presents for follow-up. She has been using Dakin's wet-to-dry dressings to the wound beds on her feet bilaterally and antibiotic ointment to the right anterior leg wound. She had a wound culture done at last clinic visit that showed moderate Pseudomonas aeruginosa sensitive to ciprofloxacin. She currently denies systemic signs of infection. 6/14; patient presents for follow-up. She received Keystone 5 days ago and has been using this on the wound beds. She states that last week she had to go to the hospital because she had increased warmth and erythema to the right foot. She was started on 2 oral antibiotics. She states she has been taking these. She currently denies systemic signs of infection. She has no issues or complaints today. 6/21; patient presents for follow-up. She states she has been using Keystone antibiotics to the wound beds. She has no issues or complaints today. She denies signs of infection. 6/28; patient presents for follow-up. She has been using Keystone antibiotics to the wound beds. She has no issues or complaints today. 7/12; patient presents for follow-up. Has been using Keystone  antibiotics to the wound beds with calcium alginate. She has no issues or complaints today. She never followed up with her orthopedic surgeon who did the OR debridement to the right foot. We discussed the total contact cast for the left foot and patient would like to do this next week. 7/19; patient presents for follow-up. She has been using Keystone antibiotics with calcium alginate to the wound beds. She has no issues or complaints today. Patient is in agreement to do the total contact cast of the left foot today. She knows to return later this week for the obligatory cast change. 05-13-2022 upon evaluation today patient's wound which she has the cast of the left leg actually appears to be doing significantly better. Fortunately I do not see any signs of active infection locally or systemically which is great news and overall I am extremely pleased with where we stand currently. 7/26; patient presents for follow-up. She has a cast in place for the past week. She states it irritated her shin. Other than that she tolerated the cast well. She states she would like a break for 1 week from the cast. We have been using Keystone antibiotic and Aquacel to both wound beds. She denies signs of infection. 8/2; patient presents for follow-up. She has been using Keystone and Aquacel to the wound beds. She denies any issues and has no complaints. She is agreeable to have the cast placed today for the left leg. 06-03-2022 upon evaluation today patient appears to be doing well with regard to her wound she saw some signs of improvement which is great news. Fortunately I do not see any evidence of active infection locally or systemically at this time which is great news. No fevers, chills, nausea, vomiting, or diarrhea. 8/16; patient presents for follow-up. She has no issues or complaints today. We have been using Keystone and Aquacel to the wound beds. The left lower extremity is in a total contact cast. She is  tolerated this well. 8/23; patient presents for follow-up. She has had the total contact cast on the left leg for the past week. Unfortunately this has rubbed and broken down the skin to the medial foot. She currently denies signs of infection.  She has been using Keystone antibiotic to the right plantar foot wound. 8/30; patient presents for follow-up. We have held off on the total contact cast for the left leg for the past week. Her wound on the left foot has improved and the previous surrounding breakdown of skin has epithelialized. She has been using Keystone antibiotic to both wound beds. She has no issues or complaints today. She denies signs of infection. 9/6; patient presents for follow-up. She has ordered her's Keystone antibiotic and this is arriving this week. She has been doing Dakin's wet-to-dry dressings to the wound beds. She denies signs of infection. She is agreeable to the total contact cast today. 9/13; patient presents for follow-up. She states that the cast caused her left leg shin to be sore. She would like to take a break from the cast this week. She JARYIA, HYAMS A ([id number]) [id number] Page 6 of 17 has been using Keystone antibiotic to the right plantar foot wound. She denies signs of infection. 9/20; patient presents for follow-up. She has been using Keystone antibiotics to the wound beds with calcium alginate to the right foot wound and Hydrofera Blue to the left foot wound. She is agreeable to having the cast placed today. She has been approved for Apligraf and we will order this for next clinic visit. 9/27; patient presents for follow-up. We have been using Keystone antibiotic with Hydrofera Blue to the left foot wound under a total contact cast. T the right o foot wound she has been using Keystone antibiotic and calcium alginate. She declines a total contact cast today. Apligraf is available for placement and she would like to proceed with  this. 07-28-2022 upon evaluation today patient appears to be doing well currently in regard to her wound. She is actually showing signs of significant improvement which is great news. Fortunately I do not see any evidence of active infection locally nor systemically at this time. She has been seeing Dr. Mikey Bussing and to be honest has been doing very well with the cast. Subsequently she comes in today with a cast on and we did reapply that today as well. She did not really want to she try to talk me out of that but I explained that if she wanted to heal this is really the right way to go. Patient voiced understanding. In regard to her right foot this is actually a lot better compared to the last time I saw her which is also great news. 10/11; patient presents for follow-up. Apligraf and the total contact cast was placed to the left leg at last clinic visit. She states that her right foot wound had burning pain to it with the placement of Apligraf to this area. She has been doing [place] over this area. She denies signs of infection including increased warmth, erythema or purulent drainage. 11/1; 3-week follow-up. The patient fortunately did not have a total contact cast or an Apligraf and on the left foot. She has been using Keystone ABD pads and kerlix and her own running shoes She arrives in clinic today with thick callus and a very poor surface on the left foot on the right nonviable skin subcutaneous tissue and a deep probing hole. 11/15; patient missed her last clinic appointment. She states she has not been dressing the wound beds for the past 2 weeks. She states that at she had a new roommate but is now going back to live with her mother. Apparently its been a distracting 2 weeks. Patient currently denies  signs of infection. 11/22; patient presents for follow-up. She states she has been using Keystone antibiotic and Dakin's wet-to-dry dressings to the wound beds. She is agreeable for cast  placement today. We had ordered Apligraf however this has not been received by our facility. 11/29; Patient had a total contact cast placed at last clinic visit and she tolerated this well. We were using silver alginate under the cast. Patient's been using Keystone antibiotic with Aquacel to the right plantar foot wound. She has no issues or complaints today. Apligraf is available for placement today. Patient would like to proceed with this. 12/6; patient presents for follow-up. She had Apligraf placed in standard fashion last clinic visit under the total contact cast to the left lower extremity. She has been using Keystone antibiotic and Aquacel to the right plantar foot wound. She has no issues or complaints today. 12/13; patient presents for follow-up. She has finished 5 Apligraf placements. Was told she would not qualify for more. We have been doing a total contact cast to the left lower extremity. She has been using Keystone antibiotic and Aquacel to the right plantar foot wound. She has no issues or complaints today. 12/20; patient presents for follow-up. We have been using Hydrofera Blue with Keystone antibiotic under a total contact cast of the left lower extremity. She reports using Keystone antibiotic and silver alginate to the right heel wound. She has no issues or complaints today. 12/27; patient presents with a healthy wound on the left midfoot. We have Apligraf to apply that to that more also using a total contact cast. On the right we are using Keystone and silver alginate. She is offloading the right heel with a surgical shoealthough by her admission she is on her feet quite a bit 1/3; patient presents for follow-up. Apligraf was placed to the wound beds last clinic visit. She was placed in a total contact cast to the left lower extremity. She declines a total contact cast today. She states that her mother is in the hospital and she cannot adequately get around with the cast on. 1/10;  patient presents for follow-up. She declined the total contact cast at last clinic visit. Both wounds have declined in appearance. She states that she has been on her feet and not offloading the wound beds. She currently denies signs of infection. 1/17; patient presents for follow-up. She had the total contact cast along with Apligraf placed last week to the left lower extremity. She tolerated this well. She has been using Aquacel Ag and Keystone antibiotic to the right heel wound. She currently denies signs of infection. She has no issues or complaints today. 1/24; patient presents for follow-up. We have been using Apligraf to the left foot wound along with a total contact cast. She has done well with this. T the right o heel wound she has been using Aquacel Ag and Keystone antibiotic ointment. She has no issues or complaints today. She denies signs of infection. 1/31; patient presents for follow-up. We have been using Apligraf to the left foot wound along with the total contact cast. She continues to do well with this. To the right heel we have been using Aquacel Ag and Keystone antibiotic ointment. She has no issues or complaints today. 12-01-2022 upon evaluation patient is seen today on my schedule due to the fact that she unfortunately was in the hospital yesterday. Her cast needed to come off the only reason she is out of the hospital is due to the fact that  they would not take it off at the hospital which is somewhat bewildered reading to me to be perfectly honest. I am not certain why this was but either way she was released and then was placed on my schedule today in order to get this off and reapply the total contact casting as appropriate. I do not have an Apligraf for her today it was applied last week and today's actually expired yesterday as there was some scheduling conflicts with her being in the hospital. Nonetheless we do not have that for reapplication today but the good news that she  is not draining too much and the Apligraf can go for up to 2 weeks so I am going to go ahead and reapply the total contact casting but we are going to leave the Apligraf in place. 2/14; patient presents for follow-up. T the left leg she has had the total contact cast and Apligraf for the past week. She has had no issues with this. T the o o right heel she has been using antibiotic ointment and Aquacel Ag. 2/21; patient presents for follow-up. We have been using Apligraf and a total contact cast to the left lower extremity. She is tolerated this well. Unfortunately she is not approved for any more Apligraf per insurance. She has been using antibiotic ointment and Aquacel Ag to the right foot. She has no issues or complaints today. 2/28; patient presents for follow-up. We have been using Hydrofera Blue and antibiotic ointment under the total contact cast to the left lower extremity. He has been using Aquacel Ag with antibiotic ointment to the right plantar foot. She has no issues or complaints today. 3/6; patient presents for follow-up. She did not obtain her gentamicin ointment. She has been using Aquacel Ag to the right plantar foot wound. We have been using Hydrofera Blue with antibiotic ointment under the total contact cast to the left lower extremity. She has no issues or complaints today. 3/12; patient presents for follow-up. She has been using gentamicin ointment and Aquacel Ag to the right plantar foot wound. We have been using Hydrofera Blue with antibiotic ointment under the total contact cast on the left lower extremity foot wound. She has no issues or complaints today. 3/20; patient presents for follow-up. She has been using gentamicin ointment and Aquacel Ag to the right plantar foot wound. We have been using Hydrofera Blue with antibiotic ointment under the total contact cast to the left lower extremity wound. She has no issues or complaints today. 3/27; patient presents for follow-up. We  have been using antibiotic ointment and Aquacel Ag to the right plantar foot wound. We have been using Hydrofera Blue with antibiotic ointment under the total contact cast to the left lower extremity. Both wounds are smaller. 01-24-2023 upon evaluation today patient actually appears to be doing better compared to last time I saw her. It has been several weeks since I last saw her but nonetheless I think the total contact casting is doing a good job. Fortunately I do not see any evidence of infection or worsening overall which is great news RICKYA, KETLER A ([id number]) [id number] Page 7 of 17 and in general I do believe that she is moving in the appropriate direction. It has been a very long road but nonetheless I feel like she is nearing the end at least in regard to the left foot and even the right foot looks much better to be perfectly honest. 01-31-2023 upon evaluation today patient appears to be doing  well currently in regard to her wounds. She has been require some sharp debridement which I think is probably bone both sides to remove a lot of callus that has built up at this point. I discussed that with her today. Also think total contact cast is doing well for the left foot Emina continue that as well. 4/17; patient presents for follow-up. We have been doing Hydrofera Blue with antibiotic ointment under the total contact cast to the left lower extremity and Aquacel Ag with antibiotic ointment to the right foot wound. She has been using her offloading heel shoe here. 4/24; patient presents for follow-up. We have been doing Hydrofera Blue with antibiotic ointment under the total contact cast to the left lower extremity and Aquacel Ag with antibiotic ointment to the right foot wound. She has been using her offloading heel shoe here. Wounds are smaller with slough accumulation. 5/1; patient presents for follow-up. We have been doing Hydrofera Blue with antibiotic ointment  under the total contact cast to the left lower extremity and Aquacel Ag with antibiotic ointment to the right foot wound. Left foot wound is stable and right foot wound appears smaller. 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 On the left she is using Hydrofera Blue topical antibiotic under a total contact cast, on the right plantar heel gentamicin Aquacel Ag. According to our intake nurse both are making slow but steady improvements. Electronic Signature(s) Signed: 03/01/2023 4:41:35 PM By: Baltazar Najjar MD Entered By: Baltazar Najjar on 03/01/2023 12:02:13 -------------------------------------------------------------------------------- Physical Exam Details Patient Name: Date of Service: [name] 03/01/2023 11:00 A M Medical Record Number: [id number] Patient Account Number: [id number] Date of Birth/Sex: Treating RN: 10-22-1976 (47 y.o. Ginette Pitman Primary Care Provider: [name] Other Clinician: [name] Referring Provider: Treating Provider/Extender: RO BSO N, MICHA EL Adela Lank, [name] Weeks in Treatment: 50 Cardiovascular Pedal pulses absent bilaterally.. Notes Wound exam; right foot plantar heel small probing area with some depth although this is apparently improving there is no surrounding erythema no purulence no drainage. On the left foot there is a fair amount of callus around this wound. Surface of it looks healthy however no drainage no evidence of tenderness Electronic Signature(s) Signed: 03/01/2023 4:41:35 PM By: Baltazar Najjar MD Entered By: Baltazar Najjar on 03/01/2023 12:07:22 -------------------------------------------------------------------------------- Physician Orders Details Patient Name: Date of Service: [name] 03/01/2023 11:00 A M Medical Record Number: [id number] Patient Account Number: [id number] Date of Birth/Sex: Treating RN: 1976/06/21 (48 y.o. Ginette Pitman Primary Care Provider: [name] Other Clinician:  [name] Referring Provider: Treating Provider/Extender: RO BSO Dorris Carnes, MICHA EL Adela Lank, Georgiann Hahn in Treatment: 50 Verbal / Phone Orders: Yes Clinician: Midge Aver Read Back and Verified: Yes Diagnosis Coding Follow-up Appointments Return Appointment in 1 week. Nurse Visit as needed Bathing/ Shower/ Hygiene May shower with wound dressing protected with water repellent cover or cast protector. No tub bath. Anesthetic (Use 'Patient Medications' Section for Anesthetic Order Entry) Lidocaine applied to wound bed Edema Control - Lymphedema / Segmental Compressive Device / Other Elevate, Exercise Daily and A void Standing for Long Periods of Time. Elevate legs to the level of the heart and pump ankles as often as possible Elevate leg(s) parallel to the floor when sitting. NORMALEE, FICKES A ([id number]) [id number] Page 8 of 17 Off-Loading Total Contact Cast to Left Lower Extremity Open toe surgical shoe - Heel off loader - Right foot Other: - keep pressure off of feet. Additional Orders / Instructions Follow  Nutritious Diet and Increase Protein Intake Other: - Stay off of foot, wheelchair only Wound Treatment Wound #11 - Foot Wound Laterality: Plantar, Right Cleanser: Wound Cleanser 1 x Per Day/30 Days Discharge Instructions: Wash your hands with soap and water. Remove old dressing, discard into plastic bag and place into trash. Cleanse the wound with Wound Cleanser prior to applying a clean dressing using gauze sponges, not tissues or cotton balls. Do not scrub or use excessive force. Pat dry using gauze sponges, not tissue or cotton balls. Topical: Gentamicin 1 x Per Day/30 Days Discharge Instructions: Apply as directed by provider. in clinic Prim Dressing: AquacelAg Advantage Dressing, 2X2 (in/in) 1 x Per Day/30 Days ary Discharge Instructions: Apply to wound as directed Secondary Dressing: ABD Pad 5x9 (in/in) (Generic) 1 x Per Day/30  Days Discharge Instructions: Cover with ABD pad Secured With: Medipore T - 2M Medipore H Soft Cloth Surgical T ape ape, 2x2 (in/yd) 1 x Per Day/30 Days Secured With: [employer] Sterile or Non-Sterile 6-ply 4.5x4 (yd/yd) 1 x Per Day/30 Days Discharge Instructions: Apply Kerlix as directed Wound #12 - Foot Wound Laterality: Plantar, Left, Medial Cleanser: Wound Cleanser 1 x Per Week/30 Days Discharge Instructions: Wash your hands with soap and water. Remove old dressing, discard into plastic bag and place into trash. Cleanse the wound with Wound Cleanser prior to applying a clean dressing using gauze sponges, not tissues or cotton balls. Do not scrub or use excessive force. Pat dry using gauze sponges, not tissue or cotton balls. Prim Dressing: Hydrofera Blue Ready Transfer Foam, 2.5x2.5 (in/in) 1 x Per Week/30 Days ary Discharge Instructions: Apply Hydrofera Blue Ready to wound bed as directed Secondary Dressing: ABD Pad 5x9 (in/in) (Generic) 1 x Per Week/30 Days Discharge Instructions: Cover with ABD pad Secured With: Medipore T - 2M Medipore H Soft Cloth Surgical T ape ape, 2x2 (in/yd) 1 x Per Week/30 Days Secured With: [employer] or Non-Sterile 6-ply 4.5x4 (yd/yd) 1 x Per Week/30 Days Discharge Instructions: Apply Kerlix as directed Electronic Signature(s) Signed: 03/01/2023 4:34:51 PM By: [name] Signed: 03/01/2023 4:41:35 PM By: Baltazar Najjar MD Entered By: [name] on 03/01/2023 11:30:35 -------------------------------------------------------------------------------- Problem List Details Patient Name: Date of Service: [name] 03/01/2023 11:00 A M Medical Record Number: [id number] Patient Account Number: [id number] Date of Birth/Sex: Treating RN: July 05, 1976 (47 y.o. 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/2022 No Yes severity Panchal, Meah A ([id number]) [phone].pdf Page 9 of 17 L97.512 Non-pressure chronic ulcer of other part of right foot with fat layer exposed 03/16/2022 No Yes E11.621 Type 2 diabetes mellitus with foot ulcer 03/16/2022 No Yes E11.42 Type 2 diabetes mellitus with diabetic polyneuropathy 03/16/2022 No Yes L97.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 03/16/2022 No Yes of skin Z79.4 Long term (current) use of insulin 02/22/2023 No Yes Inactive Problems Resolved Problems Electronic Signature(s) Signed: 03/01/2023 4:41:35 PM By: Baltazar Najjar MD Entered By: Baltazar Najjar on 03/01/2023 11:57:26 -------------------------------------------------------------------------------- Progress Note Details Patient Name: Date of Service: [name] 03/01/2023 11:00 A M Medical Record Number: [id number] Patient Account Number: [id number] Date of Birth/Sex: Treating RN: Mar 24, 1976 (47 y.o. Ginette Pitman Primary Care Provider: [name] Other Clinician:  [name] Referring Provider: Treating Provider/Extender: RO BSO N, MICHA EL Adela Lank, [name] Weeks in Treatment: 50 Subjective History of Present Illness (HPI) 01/18/18-She is here for initial evaluation of the left great toe ulcer. She is a poor historian in regards to timeframe in detail. She states approximately 4 weeks ago she lacerated her toe on something in the house. She followed up with her primary care who placed her on Bactrim and ultimately a second dose of Bactrim prior to coming to wound clinic. She states she has been treating the toe with peroxide, Betadine and a Band-Aid. She did not check her blood sugar this morning but checked it yesterday morning it was  327; she is unaware of a recent A1c and there are no current records. She saw Dr. she would've orthopedics last week for an old injury to the left ankle, she states he did not see her toe, nor did she bring it to his attention. She smokes approximately 1 pack cigarettes a day. Her social situation is concerning, she arrives this morning with her mother who appears extremely intoxicated/under the influence; her mother was asked to leave the room and be monitored by the patient's grandmother. The patient's aunt then accompanied the patient and the room throughout the rest of the appointment. We had a lengthy discussion regarding the deleterious effects of uncontrolled hyperglycemia and smoking as it relates to wound healing and overall health. She was strongly encouraged to decrease her smoking and get her diabetes under better control. She states she is currently on a diet and has cut down her [facility] consumption. The left toe is erythematous, macerated and slightly edematous with malodor present. The edema in her left foot is below her baseline, there is no erythema streaking. We will treat her with Santyl, doxycycline; we have ordered and xray, culture and provided a Peg assist surgical shoe and cultured the wound. 01/25/18-She is here in follow-up evaluation for a left great toe ulcer and presents with an abscess to her suprapubic area. She states her blood sugars remain elevated, feeling "sick" and if levels are below 250, but she is trying. She has made no attempt to decrease her smoking stating that we "can't take away her food in her cigarettes". She has been compliant with offloading using the PEG assist you. She is using Santyl daily. the culture obtained last week grew staph aureus and Enterococcus faecalis; continues on the doxycycline and Augmentin was added on Monday. The suprapubic area has erythema, no femoral variation, purple discoloration, minimal induration, was accessed with a  cotton tip applicator with sanguinopurulent drainage, this was cultured, I suspect the current antibiotic treatment will cover and we will not add anything to her current treatment plan. She was advised to go to urgent care or ER with any change in redness, induration or fever. 02/01/18-She is here in follow-up evaluation for left great toe ulcers and a new abdominal abscess from last week. She was able to use packing until earlier this week, where she "forgot it was there". She states she was feeling ill with GI symptoms last week and was not taking her antibiotic. She states her glucose levels have been predominantly less than 200, with occasional levels between 200-250. She thinks this was contributing to her GI symptoms as they have resolved without intervention. There continues to be significant laceration to left toe, otherwise it clinically looks stable/improved. There is now less superficial opening to the lateral aspect of the great toe that was residual  blister. We will transition to [facility] to all wounds, she will continue her Augmentin. If there is no change or deterioration next week for reculture. 02/08/18-She is here in follow-up evaluation for left great toe ulcer and abdominal ulcer. There is an improvement in both wounds. She has been wrapping her left toe with coban, not by our direction, which has created an area of discoloration to the medial aspect; she has been advised to NOT use coban secondary to her neuropathy. She states her glucose levels have been high over this last week ranging from 200-350, she continues to smoke. She admits to being less compliant with her offloading shoe. We will continue with same treatment plan and she will follow-up next week. 02/15/18-She is here in follow-up evaluation for left great toe ulcer and abdominal ulcer. The abdominal ulcer is epithelialized. The left great toe ulcer is improved and all injury from last week using the Coban wrap is  resolved, the lateral ulcer is healed. She admits to noncompliance with wearing offloading shoe and admits to glucose levels being greater than 300 most of the week. She continues to smoke and expresses no desire to quit. There is one area medially that probes deeper than it has historically, erythema to the toe and dorsal foot has consistently waxed and waned. There is no overt signs of cellulitis or infection but we will culture the wound for any occult infection given the new area of depth and erythema. We will hold off on sensitivities for initiation of antibiotic therapy. 02/22/18-She is here in follow up evaluation for left great toe ulcer. There is overall significant improvement in both wound appearance, erythema and edema with NZURI, STENGLE A ([id number]) [phone].pdf Page 10 of 17 changes made last week. She was not initiated on antibiotic therapy. Culture obtained last week showed oxacillin sensitive staph aureus, sensitive to clindamycin. Clindamycin has been called into the pharmacy but she has been instructed to hold off on initiation secondary to overall clinical improvement and her history of antibiotic intolerance. She has been instructed to contact the clinic with any noted changes/deterioration and the wound, erythema, edema and/or pain. She will follow-up next week. She continues to smoke and her glucose levels remain elevated >250; she admits to compliance with offloading shoe 03/01/18 on evaluation today patient appears to be doing fairly well in regard to her left first toe ulcer. She has been tolerating the dressing changes with the [facility] Dressing without complication and overall this has definitely showed signs of improvement according to records as well is what the patient tells me today. I'm very pleased in that regard. She is having no pain today 03/08/18 She is here for follow up evaluation of a left great toe ulcer. She remains non-compliant  with glucose control and smoking cessation; glucose levels consistently >200. She states that she got new shoe inserts/peg assist. She admits to compliance with offloading. Since my last evaluation there is significant improvement. We will switch to prisma at this time and she will follow up next week. She is noted to be tachycardic at this appointment, heart rate 120s; she has a history of heart rate 70-130 according to our records. She admits to extreme agitation r/t personal issues; she was advised to monitor her heartrate and contact her physician if it does not return to a more normal range (<100). She takes cardizem twice daily. 03/15/18-She is here in follow-up evaluation for left great toe ulcer. She remains noncompliant with glucose control and smoking cessation.  She admits to compliance with wearing offloading shoe. The ulcer is improved/stable and we will continue with the same treatment plan and she will follow-up next week 03/22/18-She is here for evaluation for left great toe ulcer. There continues to be significant improvement despite recurrent hyperglycemia (over 500 yesterday) and she continues to smoke. She has been compliant with offloading and we will continue with same treatment plan and she will follow-up next week. 03/29/18-She is here for evaluation for left great toe ulcer. Despite continuing to smoke and uncontrolled diabetes she continues to improve. She is compliant with offloading shoe. We will continue with the same treatment plan and she will follow-up next week 04/05/18- She is here in follow up evaluation for a left great toe ulcer; she presents with small pustule to left fifth toe (resembles ant bite). She admits to compliance with wearing offloading shoe; continues to smoke or have uncontrolled blood glucose control. There is more callus than usual with evidence of bleeding; she denies known trauma. 04/12/18-She is here for evaluation of left great toe ulcer. Despite  noncompliance with glycemic control and smoking she continues to make improvement. She continues to wear offloading shoe. The pustule, that was identified last week, to the left fifth toe is resolved. She will follow-up in 2 weeks 05/03/18-she is seen in follow-up evaluation for a left great toe ulcer. She is compliant with offloading, otherwise noncompliant with glycemic control and smoking. She has plateaued and there is minimal improvement noted. We will transition to [facility], replaced the insert to her surgical shoe and she will follow-up in one week 05/10/18- She is here in follow up evaluation for a left great toe ulcer. It appears stable despite measurement change. We will continue with same treatment plan and follow up next week. 05/24/18-She is seen in follow-up evaluation for a left great toe ulcer. She remains compliant with offloading, has made significant improvement in her diet, decreasing the amount of sugar/soda. She said her recent A1c was 10.9 which is lower than. She did see a diabetic nutritionist/educator yesterday. She continues to smoke. We will continue with the same treatment plan and she'll follow-up next week. 05/31/18- She is seen in follow-up evaluation for left great toe ulcer. She continues to remain compliant with offloading, continues to make improvement in her diet, increasing her water and decreasing the amount of sugar/soda. She does continue to smoke with no desire to quit. We will apply Prisma to the depth and Hydrofera Blue over. We have not received insurance authorization for oasis. She will follow up next week. 06/07/18-She is seen in follow-up evaluation for left great toe ulcer. It has stalled according to today's measurements although base appears stable. She says she saw a diabetic educator yesterday; her average blood sugars are less than 300 which is an improvement for her. She continues to smoke and states "that's my next step" She continues with water  over soda. We will order for xray, culture and reinstate ace wrap compression prior to placing apligraf for next week. She is voicing no complaints or concerns. Her dressing will change to iodoflex over the next week in preparation for apligraf. 06/14/18-She is seen in follow-up evaluation for left great toe ulcer. Plain film x-ray performed last week was negative for osteomyelitis. Wound culture obtained last week grew strep B and OSSA; she is initiated on keflex and cefdinir today; there is erythema to the toe which could be from ace wrap compression, she has a history of wrapping too tight  and has has been encouraged to maintain ace wraps that we place today. We will hold off on application of apligraf today, will apply next week after antibiotic therapy has been initiated. She admits today that she has resumed taking a shower with her foot/toe submerged in water, she has been reminded to keep foot/toe out of the bath water. She will be seen in follow up next week 06/21/18-she is seen in follow-up evaluation for left great toe ulcer. She is tolerating antibiotic therapy with no GI disturbance. The wound is stable. Apligraf was applied today. She has been decreasing her smoking, only had 4 cigarettes yesterday and 1 today. She continues being more compliant in diabetic diet. She will follow-up next week for evaluation of site, if stable will remove at 2 weeks. 06/28/18- She is here in follow up evalution. Apligraf was placed last week, she states the dressing fell off on Tuesday and she was dressing with hydrofera blue. She is healed and will be discharged from the clinic today. She has been instructed to continue with smoking cessation, continue monitoring glucose levels, offloading for an additional 4 weeks and continue with hydrofera blue for additional two weeks for any possible microscopic opening. Readmission: 08/07/18 on evaluation today patient presents for reevaluation concerning the ulcer of  her right great toe. She was previously discharged on 06/28/18 healed. Nonetheless she states that this began to show signs of drainage she subsequently went to her primary care provider. Subsequently an x-ray was performed on 08/01/18 which was negative. The patient was also placed on antibiotics at that time. Fortunately they should have been effective for the infection. Nonetheless she's been experiencing some improvement but still has a lot of drainage coming from the wound itself. 08/14/18 on evaluation today patient's wound actually does show signs of improvement in regard to the erythema at this point. She has completed the antibiotics. With that being said we did discuss the possibility of placing her in a total contact cast as of today although I think that I may want to give this just a little bit more time to ensure nothing recurrence as far as her infection is concerned. I do not want to put in the cast and risk infection at that time if things are not completely resolved. With that being said she is gonna require some debridement today. 08/21/18 on evaluation today patient actually appears to be doing okay in regard to her toe ulcer. She's been tolerating the dressing changes without complication. With that being said it does appear that she is ready and in fact I think it's appropriate for [place] to go ahead and initiate the total contact cast today. Nonetheless she will require some sharp debridement to prepare the wound for application. Overall I feel like things have been progressing well but we do need to do something to get this to close more readily. 08/24/18 patient seen today for reevaluation after having had the total contact cast applied on Tuesday. She seems to have done very well the wound appears to be doing great and overall I'm pleased with the progress that she's made. There were no abnormal areas of rubbing from the cast on her lower extremity. 08/30/18 on evaluation today patient  actually appears to be completely healed in regard to her plantar toe ulcer. She tells me at this point she's been having a lot of issues with the cast. She almost fell a couple of times the state shall the step of her dog a couple times  as well. This is been a very frustrating process for her other nonetheless she has completely healed the wound which is excellent news. Overall there does not appear to be the evidence of infection at this time which is great news. 09/11/18 evaluation today patient presents for follow-up concerning her great toe ulcer on the left which has unfortunately reopened since I last saw her which was only a couple of weeks ago. Unfortunately she was not able to get in to get the shoe and potentially the AFO that's gonna be necessary due to her left foot drop. She continues with offloading shoe but this is not enough to prevent her from reopening it appears. When we last had her in the total contact cast she did well from a healing standpoint but unfortunately the wound reopened as soon as she came out of the cast within just a couple of weeks. 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 This is also part of the reason why she falls on a regular basis. I just do not believe that is gonna be safe for [place] to reinitiate the total contact cast as last time we had this on she fell 3 times one week which is definitely not normal for her. 09/18/18 upon evaluation today the patient actually appears to be doing about the same in regard to her toe ulcer. She did not contact [employer] as I asked her to even though I had given her the prescription. In fact she actually states that she has no idea where the prescription is. She did apparently call [employer] and they told her that all she needed to do  was bring the prescription in order to be able to be seen and work on getting the AFO for her left foot. With all that being said she still does not have an appointment and I'm not sure were things stand that regard. I will give her a new prescription today in order to contact them to get this set up. 09/25/18 on evaluation today patient actually appears to be doing about the same in regard to her toes ulcer. She does have a small areas which seems to have Gerhart, Marigny A ([id number]) [id number].pdf Page 11 of 17 a lot of callous buildup around the edge of the wound which is going to need sharp debridement today. She still is waiting to be scheduled for evaluation with [employer] for possibility of an AFO. She states there supposed to call her tomorrow to get this set up. Unfortunately it does appear that her foot specifically the toe area is showing signs of erythema. There does not appear to be any systemic infection which is in these good news. 10/02/18 on evaluation today patient actually appears to be doing about the same in regard to her toe ulcer. This really has not done too well although it's not significantly larger it's also not significantly smaller. She has been tolerating the dressing changes without complication. She actually has her appointment with [employer] and  tomorrow to hopefully be measured for obtaining and AFO splint. I think this would be helpful preventing this from reoccurring. We had contemplated starting the cast this week although to be honest I am reluctant to do that as she's been having nausea, vomiting, and seizure activity over the  past three days. She has a history of seizures and have been told is nothing that can be done for these. With that being said I do believe that along with the seizures have the nausea vomiting which upon further questioning doesn't seem to be the normal for her and makes me concerned for the possibility  of infection or something else going on. I discussed this with the patient and her mother during the office visit today. I do not feel the wound is effective but maybe something else. The responses this was "this just happens to her at times and we don't know why". They did not seem to be interested in going to the hospital to have this checked out further. 10/09/18 on evaluation today patient presents for follow-up concerning her ongoing toe ulcer. She has been tolerating the dressing changes without complication. Fortunately there does not appear to be any evidence of infection which is great news however I do think that the patient would benefit from going ahead for with the total contact cast. She's actually in a wheelchair today she tells me that she will use her walker if we initiate the cast. I was very specific about the fact that if we were gonna do the cast I wanted to make sure that she was using the walker in order to prevent any falls. She tells me she does not have stairs that she has to traverse on a regular basis at her home. She has not had any seizures since last week again that something that happens to her often she tells me she did talk to [employer] and they said that it may take up to three weeks to get the brace approved for her. Hopefully that will not take that long but nonetheless in the meantime I do think the cast could be of benefit. 10/12/18 on evaluation today patient appears to be doing rather well in regard to her toe ulcer. It's just been a few days and already this is significantly improved both as far as overall appearance and size. Fortunately there's no sign of infection. She is here for her first obligatory cast change. 10/19/18 Seen today for follow up and management of left great toe ulcer. Wound continues to show improvement. Noted small open area with seroussang drainage with palpation. Denies any increased pain or recent fevers during visit. She will continue  calcium alginate with offloading shoe. Denies any questions or concerns during visit. 10/26/18 on evaluation today patient appears to be doing about the same as when I last saw her in regard to her wound bed. Fortunately there does not appear to be any signs of infection. Unfortunately she continues to have a breakdown in regard to the toe region any time that she is not in the cast. It takes almost no time at all for this to happen. Nonetheless she still has not heard anything from the brace being made by [employer] as to when exactly this will be available to her. Fortunately there is no signs of infection at this time. 10/30/18 on evaluation today patient presents for application of the total contact cast as we just received him this morning. Fortunately we are gonna be able to apply this to her today which is great news. She continues to have no significant pain which is good news. Overall I do feel like things have been improving while she was the cast is when she doesn't have a cast that things get worse. She still has not really heard anything  from [employer] regarding her brace. 11/02/18 upon evaluation today patient's wound already appears to be doing significantly better which is good news. Fortunately there does not appear to be any signs of infection also good news. Overall I do think the total contact cast as before is helping to heal this area unfortunately it's just not gonna likely keep the area closed and healed without her getting her brace at least. Again the foot drop is a significant issue for her. 11/09/18 on evaluation today patient appears to be doing excellent in regard to her toe ulcer which in fact is completely healed. Fortunately we finally got the situation squared away with the paperwork which was needed to proceed with getting her brace approved by Medicaid. I have filled that out unfortunately that information has been sent to the orthopedic office that I worked at 2 1/2 years ago  and not tired Current wound care measures. Fortunately she seems to be doing very well at this time. 11/23/18 on evaluation today patient appears to be doing More Poorly Compared to Last Time I Saw Her. At [facility] She Had Completely Healed. Currently she is continuing to have issues with reopening. She states that she just found out that the brace was approved through Medicaid now she just has to go get measured in order to have this fitted for her and then made. Subsequently she does not have an appointment for this yet that is going to complicate things we obviously cannot put her back in the cast if we do not have everything measured because they're not gonna be able to measure her foot while she is in the cast. 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 Obviously this is unfortunate and does have me somewhat worried as well. 11/30/18 on evaluation today patient's toe ulcer actually appears to be doing fairly well. The good news is she will be getting her brace in the shoes next week on Wednesday. Hopefully we will be able to get this to heal without having to go back in the cast however she may need the cast in order to get the wound completely heal and then go from there. Fortunately there's no signs of infection at this time. 12/07/18 on evaluation today patient fortunately did receive her brace and she states she could tell this definitely makes her walk better. With that being said she's been having issues with her toe where she noticed yesterday there was a lot of tissue that was loosing off this appears to be much larger than what it was previous. She also states that her leg has been read putting much across the top of her foot just about the ankle although this seems to be receiving somewhat. The total area is still red and appears to be someone infected as best I can tell. She is previously  taken Bactrim and that may be a good option for her today as well. We are gonna see what I wound culture shows as well and I think that this is definitely appropriate. With that being said outside of the culture I still need to initiate something in the interim and that's what I'm gonna go ahead and select Bactrim is a good option for her. 12/14/18 on evaluation today patient appears to be doing better in regard to her left great toe ulcer as compared to last week's evaluation. There's still some erythema  although this is significantly improved which is excellent news. Overall I do believe that she is making good progress is still gonna take some time before she is where I would like her to be from the standpoint of being able to place her back into the total contact cast. Hopefully we will be where we need to be by next week. 12/21/18 on evaluation today patient actually appears to be doing poorly in regard to her toe ulcer. She's been tolerating the dressing changes without complication. Fortunately there's no signs of systemic infection although she does have a lot of drainage from the toe ulcer and this does seem to be causing some issues at this point. She does have erythema on the distal portion of her toe that appears to be likely cellulitis. 12/28/18 on evaluation today patient actually appears to be doing a little better in my pinion in regard to her toe ulcer. With that being said she still does have some evidence of infection at this time and for her culture she had both E. coli as well as enterococcus as organisms noted on evaluation. For that reason I think that though the Keflex likely has treated the E. coli rather well this has really done nothing for the enterococcus. We are going to have to initiate treatment for this specifically. 01/04/19 on evaluation today patient's toe actually appears to be doing better from the standpoint of infection. She currently would like to see about putting  the cash back on I think that this is appropriate as long as she takes care of it and keeps it from getting wet. She is gonna have some drainage we can definitely pass this up with Drawtex and alginate to try to prevent as much drainage as possible from causing the problems. With that being said I do want to at least try her with the cast between now and Tuesday. If there any issues we can't continue to use it then I will discontinue the use of the cast at that point. 01/08/19 on evaluation today patient actually appears to be doing very well as far as her foot ulcer specifically the great toe on the left is concerned. She did have an area of rubbing on the medial aspect of her left ankle which again is from the cast. Fortunately there's no signs of infection at this point in this appears to be a very slight skin breakdown. The patient tells me she felt it rubbing but didn't think it was that bad. Fortunately there is no signs of active infection at this time which is good news. No fevers, chills, nausea, or vomiting noted at this time. 01/15/19 on evaluation today patient actually appears to be doing well in regard to her toe ulcer. Again as previous she seems to do well and she has the cast on which indicates to me that during the time she doesn't have a cast on she's putting way too much pressure on this region. Obviously I think that's gonna be an issue as with the current national emergency concerning the Covid-19 Virus it has been recommended that we discontinue the use of total contact casting by WYNN, WINDERS A ([id number]) [phone].pdf Page 12 of 17 the chief medical officer of our company, Dr. Maurine Minister. The reasoning is that if a patient becomes sick and cannot come into have the cast removed they could not just leave this on for an additional two weeks. Obviously the hospitals also do not want to receive patient's who are sick into  the emergency department  to potentially contaminate the region and spread the Covid-19 Virus among other sick individuals within the hospital system. Therefore at this point we are suspending the use of total contact cast until the current emergency subsides. This was all discussed with the patient today as well. 01/22/19 on evaluation today patient's wound on her left great toe appears to be doing slightly worse than previously noted last week. She tells me that she has been on this quite a bit in fact she tells me she's been awake for 38 straight hours. This is due to the fact that she's having to care for grandparents because nobody else will. She has been taking care of them for five the last seven days since I've seen her they both have dementia his is from a stroke and her grandmother's was progressive. Nonetheless she states even her mom who knows her condition and situation has only help two of those days to take care of them she's been taking care of the rest. Fortunately there does not appear to be any signs of active infection in regard to her toe at this point although obviously it doesn't look as good as it did previous. I think this is directly related to her not taking off the pressure and friction by way of taking things easy. Though I completely understand what's going on. 01/29/19 on evaluation today patient's tools are actually appears to be showing some signs of improvement today compared to last week's evaluation as far as not necessarily the overall size of the wound but the fact that she has some new skin growth in between the two ends of the wound opening. Overall I feel like she has done well she states that she had a family member give her what sounds to be a CAM walker boot which has been helpful as well. 02/05/19 on evaluation today patient's wound bed actually appears to be doing significantly better in regard to her overall appearance of the size of the wound. With that being said she is still having  an issue with offloading efficiently enough to get this to close. Apparently there is some signs of infection at this point as well unfortunately. Previously she's done well of Augmentin I really do not see anything that needs to be culture currently but there theme and cellulitis of the foot that I'm seeing I'm gonna go ahead and place her on an antibiotic today to try to help clear this up. 02/12/2019 on evaluation today patient actually appears to be doing poorly in regard to her overall wound status. She tells me she has been using her offloading shoe but actually comes in today wearing her tennis shoe with the AFO brace. Again as I previously discussed with her this is really not sufficient to allow the area to heal appropriately. Nonetheless she continues to be somewhat noncompliant and I do wonder based on what she has told my nurse in the past as to whether or not a good portion of this noncompliance may be recreational drug and alcohol related. She has had a history of heroin overdose and this was fairly recently in the past couple of months that have been seeing her. Nonetheless overall I feel like her wound looks significantly worse today compared to what it was previous. She still has significant erythema despite the Augmentin I am not sure that this is an appropriate medication for her infection I am also concerned that the infection may have gone down into her bone.  02/19/19 on evaluation today patient actually appears to be doing about the same in regard to her toe ulcer. Unfortunately she continues to show signs of bone exposure and infection at this point. There does not appear to be any evidence of worsening of the infection but I'm also not really sure that it's getting significantly better. She is on the Augmentin which should be sufficient for the Staphylococcus aureus infection that she has at this point. With that being said she may need IV antibiotics to more appropriately treat  this. We did have a discussion today about hyperbaric option therapy. 02/28/19 on evaluation today patient actually appears to be doing much worse in regard to the wound on her left great toe as compared to even my previous evaluation last week. Unfortunately this seems to be training in a pretty poor direction. Her toe was actually now starting to angle laterally and I can actually see the entire joint area of the proximal portion of the digit where is the distal portion of the digit again is no longer even in contact with the joint line. Unfortunately there's a lot more necrotic tissue around the edge and the toe appears to be showing signs of becoming gangrenous in my pinion. I'm very concerned about were things stand at this point. She did see infectious disease and they are planning to send in a prescription for Sivextro for her and apparently this has been approved. With that being said I don't think she should avoid taking this but at the same time I'm not sure that it's gonna be sufficient to save her toe at this point. 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 03/05/19 on evaluation today patient actually appears to be doing quite well in regard to her toe all things considering. She still has bone exposed but there appears to be much less your thing on overall the appearance of the wound and the toe itself is dramatically improved. She still does have some issues currently obviously with infection she did see vascular as well and there concerned that her blood flow to the toad. For that reason they are setting up for an angiogram next week. 03/14/19 on evaluation today patient appears to be doing very poor in regard to her toe and specifically in regard to the ulceration and the fact that  she's starting to notice the toe was leaning even more towards the lateral aspect and the complete joint is visible on the proximal aspect of the joint. Nonetheless she's also noted a significant odor and the tip of the toe is turning more dark and necrotic appearing. Overall I think she is getting worse not better as far as this is concerned. For that reason I am recommending at this point that she likely needs to be seen for likely amputation. READMISSION 03/19/2021 This is a patient that we cared for in this clinic for a prolonged period of time in 2019 and 2020 with a left foot and left first toe wound. 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 Excell Seltzer. In December 2021 she had an ulcer on her right great toe as well as the fourth and fifth toes.  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 She also had an angiogram at that time and underwent angioplasty of the right anterior tibial artery. In any case she claims that the wound on the right foot is closed I did not look at this today which was probably an oversight although I think that should be done next week. After her surgery she developed a dehiscence but I do not see any follow-up of this. According to Dr. Bernette Redbird last review that she was out of the area being cared for by another physician but recently came back to his attention. 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 A culture of this area showed E. coli apparently before she came back to see Dr. Excell Seltzer she was supposed to be receiving antibiotics but she did not really take them. Nor is she offloading this area at all. Finally her last hemoglobin A1c listed in epic was in March 2022 at 14.1 she says things are a lot better since then although I am not sure. She was hospitalized in March with metabolic multifactorial  encephalopathy. 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 She had this wound at the time. During this admission she had E. coli sepsis a TEE was negative. 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 She is still a heavy cigarette smoker. She has a history of bipolar disorder seizure disorder ABI in our clinic was 1.05 6/1; left midfoot in the setting of a TMA done previously. Round circular wound with a "knuckle" of protruding tissue. The problem is that the knuckle was not attached to any of the surrounding granulation and this probed proximally widely I removed a large portion of this tissue. This wound goes with considerable undermining laterally. I do not feel any bone there was no purulence but this is a deep wound. 6/8; in spite of the debridement I did last week. She arrives with a wound looking exactly the same. A protruding "knuckle" of tissue nonadherent to most of the surrounding tissue. There is considerable depth around this from 6-12 o'clock at 2.7 cm and undermining of 1 cm. This does not look overtly infected and the x- ray I did last week was negative for any osseous abnormalities. We have been using silver collagen 6/15; deep tissue culture I did last week showed moderate staph aureus and moderate Pseudomonas. This will definitely require prolonged antibiotic therapy. The pathology on the protuberant area was negative for malignancy fungus etc. 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 We have been using silver collagen. I am going to be prescribing Levaquin for 2 weeks. Her CT scan of the foot is down for 7/5 6/22; CT scan of the foot on 7 5. She says she has  hardware in the left leg from her previous fracture. She is on the Levaquin for the deep tissue culture I did that showed methicillin sensitive staph aureus and Pseudomonas. I gave her a 2-week supply and she will have another week. She arrives in clinic today with the same protuberant tissue however this is nonadherent to the tissue surrounding it. I am really at a loss to explain this unless there is underlying deep tissue infection 6/29; patient presents for  1 week follow-up. She has been using collagen to the wound bed. She reports taking her antibiotics as prescribed.She has no complaints or issues today. She denies signs of infection. ARTURO, GUTERMUTH A ([id number]) [id number] Page 25 of 17 7/6; patient presents for one week followup. She has been using collagen to the wound bed. She states she is taking Levaquin however at times she is not able to keep it down. She denies signs of infection. 7/13; patient presents for 1 week follow-up. She has been using silver alginate to the wound bed. She still has nausea when taking her antibiotics. She denies signs of infection. 7/20; patient presents for 1 week follow-up. She has been using silver alginate with gentamicin cream to the wound bed. She denies any issues and has no complaints today. She denies signs of infection. 7/27; patient presents for 1 week follow-up. She continues to use silver alginate with gentamicin cream to the wound bed. She reports starting her antibiotics. She has no issues or complaints. Overall she reports stability to the wound. 8/3; patient presents for 1 week follow-up. She has been using silver alginate with gentamicin cream to the wound bed. She reports completing all antibiotics. She has no issues or complaints today. She denies signs of infection. 8/17; patient presents for 2-week follow-up. He is to use silver alginate to the wound bed. She has no issues or complaints today. She denies signs  of infection. She reports her pain has improved in her foot since last clinic visit 8/24; patient presents for 1 week follow-up. She continues to use silver alginate to the wound bed. She has no issues or complaints. She denies signs of infection. Pain is stable. 9/7; patient presents for follow-up. She missed her last week appointment due to feeling ill. She continues to use silver alginate. She has a new wound to the right lower extremity that is covered in eschar. She states It occurred over the past week and has no idea how it started. She currently denies signs of infection. 9/14; patient presents for follow-up. T the left foot wound she has been using gentamicin cream and silver alginate. T the right lower extremity wound she has o o been keeping this covered and has not obtain Santyl. 9/21; patient presents for follow-up. She reports using gentamicin cream and silver alginate to the left foot and Santyl to the right lower extremity wound. She has no issues or complaints today. She denies signs of infection. 9/28; patient presents for follow-up. She reports a new wound to her right heel. She states this occurred a few days ago and is progressively gotten worse. She has been trying to clean the area with a Q-tip and Santyl. She reports stability in the other 2 wounds. She has been using gentamicin cream and silver alginate to the left foot and Santyl to the right lower extremity wound. 10/12; patient presents for follow-up. She reports improvement to the wound beds. She is seeing vein and vascular to discuss the potential of a left BKA. She states they are going to do an arteriogram. She continues to use silver alginate with dressing changes to her wounds. 11/2; patient presents for follow-up. She states she has not been doing dressing changes to the wound beds. She states she is not able to offload the areas. She reports chronic pain to her left foot wound. 11/9; patient presents for  follow-up. She came in with only socks on. She states she forgot to put on shoes. It is unclear if she  is doing any dressing changes. She currently denies systemic signs of infection. 11/16; patient presents for follow-up. She came again only with socks on. She states she does not wear shoes ever. It is unclear if she does dressing changes. She currently denies systemic signs of infection. 11/23; patient presents for follow-up. She wore her shoes today. It still unclear exactly what dressing she is using for each wound but she did states she obtained Dakin's solution and has been using this to the left foot wound. She currently denies signs of infection. 11/30; patient presents for follow-up. She has no issues or complaints today. She currently denies signs of infection. 12/7; patient presents for follow-up. She has no issues or complaints today. She has been using Hydrofera Blue to the right heel wound and Dakin solution to the left foot wound. Her right anterior leg wound is healed. She currently denies signs of infection. 12/14; patient presents for follow-up. She has been using Hydrofera Blue to the right heel and Dakin's to the left foot wounds. She has no issues or complaints today. She denies signs of infection. 12/21; patient presents for follow-up. She reports using Hydrofera Blue to the right heel and Dakin's to the left foot wound. She denies signs of infection. 12/28; patient presents for follow-up. She continues to use Dakin's to the left foot wound and Hydrofera Blue to the right heel wound. She denies signs of infection. 1/4; patient presents for follow-up. She has no issues or complaints today. She denies signs of infection. 1/11; patient presents for follow-up. It is unclear if she has been dressing these wounds over the past week. She currently denies signs of infection. 1/18; patient presents for follow-up. She states she has been using Dakin's wet-to-dry dressings to the left  foot. She has been using Hydrofera Blue to the right foot foot wound. She states that the anterior right leg wound has reopened and draining serous fluid. She denies signs of infection. 1/25; patient presents for follow-up. She has no issues or complaints today. 2/1; patient presents for follow-up. She has no issues or complaints today. She denies signs of infection. 2/8; patient presents for follow-up. She has lost her surgical shoes. She did not have a dressing to the right heel wound. She currently denies signs of infection. 2/15; patient presents for follow-up. She reports more pain to the right heel today. She denies purulent drainage Or fever/chills 2/22; patient presents for follow-up. She reports taking clindamycin over the past week. She states that she continues to have pain to her right heel. She reports purulent drainage. Readmission 03/16/2022 Ms. Briell Revolorio is a 47 year old female with a past medical history of type 2 diabetes, osteomyelitis to her feet, chronic systolic heart failure and bipolar disorder that presents to the clinic for bilateral feet wounds and right lower extremity wound. She was last seen in our clinic on 12/15/2021. At that time she had purulent drainage coming out of her right plantar foot and I recommended she go to the ED. She states she went to [facility] and has been there for the past 3 months. I cannot see the records. She states she had OR debridement and was on several weeks of IV antibiotics while inpatient. Since discharge she has not been taking care of the wound beds. She had nothing on her feet other than socks today. She currently denies signs of infection. 5/31; patient presents for follow-up. She has been using Dakin's wet-to-dry dressings to the wound beds on her feet  bilaterally and antibiotic ointment to the right anterior leg wound. She had a wound culture done at last clinic visit that showed moderate Pseudomonas aeruginosa  sensitive to ciprofloxacin. She currently denies systemic signs of infection. 6/14; patient presents for follow-up. She received Keystone 5 days ago and has been using this on the wound beds. She states that last week she had to go to the hospital because she had increased warmth and erythema to the right foot. She was started on 2 oral antibiotics. She states she has been taking these. She currently denies systemic signs of infection. She has no issues or complaints today. 6/21; patient presents for follow-up. She states she has been using Keystone antibiotics to the wound beds. She has no issues or complaints today. She denies signs of infection. 6/28; patient presents for follow-up. She has been using Keystone antibiotics to the wound beds. She has no issues or complaints today. CLAY, ALVIAR A ([id number]) [id number] Page 14 of 17 7/12; patient presents for follow-up. Has been using Keystone antibiotics to the wound beds with calcium alginate. She has no issues or complaints today. She never followed up with her orthopedic surgeon who did the OR debridement to the right foot. We discussed the total contact cast for the left foot and patient would like to do this next week. 7/19; patient presents for follow-up. She has been using Keystone antibiotics with calcium alginate to the wound beds. She has no issues or complaints today. Patient is in agreement to do the total contact cast of the left foot today. She knows to return later this week for the obligatory cast change. 05-13-2022 upon evaluation today patient's wound which she has the cast of the left leg actually appears to be doing significantly better. Fortunately I do not see any signs of active infection locally or systemically which is great news and overall I am extremely pleased with where we stand currently. 7/26; patient presents for follow-up. She has a cast in place for the past week. She states it  irritated her shin. Other than that she tolerated the cast well. She states she would like a break for 1 week from the cast. We have been using Keystone antibiotic and Aquacel to both wound beds. She denies signs of infection. 8/2; patient presents for follow-up. She has been using Keystone and Aquacel to the wound beds. She denies any issues and has no complaints. She is agreeable to have the cast placed today for the left leg. 06-03-2022 upon evaluation today patient appears to be doing well with regard to her wound she saw some signs of improvement which is great news. Fortunately I do not see any evidence of active infection locally or systemically at this time which is great news. No fevers, chills, nausea, vomiting, or diarrhea. 8/16; patient presents for follow-up. She has no issues or complaints today. We have been using Keystone and Aquacel to the wound beds. The left lower extremity is in a total contact cast. She is tolerated this well. 8/23; patient presents for follow-up. She has had the total contact cast on the left leg for the past week. Unfortunately this has rubbed and broken down the skin to the medial foot. She currently denies signs of infection. She has been using Keystone antibiotic to the right plantar foot wound. 8/30; patient presents for follow-up. We have held off on the total contact cast for the left leg for the past week. Her wound on the left foot has improved and the previous  surrounding breakdown of skin has epithelialized. She has been using Keystone antibiotic to both wound beds. She has no issues or complaints today. She denies signs of infection. 9/6; patient presents for follow-up. She has ordered her's Keystone antibiotic and this is arriving this week. She has been doing Dakin's wet-to-dry dressings to the wound beds. She denies signs of infection. She is agreeable to the total contact cast today. 9/13; patient presents for follow-up. She states that the cast  caused her left leg shin to be sore. She would like to take a break from the cast this week. She has been using Keystone antibiotic to the right plantar foot wound. She denies signs of infection. 9/20; patient presents for follow-up. She has been using Keystone antibiotics to the wound beds with calcium alginate to the right foot wound and Hydrofera Blue to the left foot wound. She is agreeable to having the cast placed today. She has been approved for Apligraf and we will order this for next clinic visit. 9/27; patient presents for follow-up. We have been using Keystone antibiotic with Hydrofera Blue to the left foot wound under a total contact cast. T the right o foot wound she has been using Keystone antibiotic and calcium alginate. She declines a total contact cast today. Apligraf is available for placement and she would like to proceed with this. 07-28-2022 upon evaluation today patient appears to be doing well currently in regard to her wound. She is actually showing signs of significant improvement which is great news. Fortunately I do not see any evidence of active infection locally nor systemically at this time. She has been seeing Dr. Mikey Bussing and to be honest has been doing very well with the cast. Subsequently she comes in today with a cast on and we did reapply that today as well. She did not really want to she try to talk me out of that but I explained that if she wanted to heal this is really the right way to go. Patient voiced understanding. In regard to her right foot this is actually a lot better compared to the last time I saw her which is also great news. 10/11; patient presents for follow-up. Apligraf and the total contact cast was placed to the left leg at last clinic visit. She states that her right foot wound had burning pain to it with the placement of Apligraf to this area. She has been doing [place] over this area. She denies signs of infection including increased warmth,  erythema or purulent drainage. 11/1; 3-week follow-up. The patient fortunately did not have a total contact cast or an Apligraf and on the left foot. She has been using Keystone ABD pads and kerlix and her own running shoes She arrives in clinic today with thick callus and a very poor surface on the left foot on the right nonviable skin subcutaneous tissue and a deep probing hole. 11/15; patient missed her last clinic appointment. She states she has not been dressing the wound beds for the past 2 weeks. She states that at she had a new roommate but is now going back to live with her mother. Apparently its been a distracting 2 weeks. Patient currently denies signs of infection. 11/22; patient presents for follow-up. She states she has been using Keystone antibiotic and Dakin's wet-to-dry dressings to the wound beds. She is agreeable for cast placement today. We had ordered Apligraf however this has not been received by our facility. 11/29; Patient had a total contact cast placed at  last clinic visit and she tolerated this well. We were using silver alginate under the cast. Patient's been using Keystone antibiotic with Aquacel to the right plantar foot wound. She has no issues or complaints today. Apligraf is available for placement today. Patient would like to proceed with this. 12/6; patient presents for follow-up. She had Apligraf placed in standard fashion last clinic visit under the total contact cast to the left lower extremity. She has been using Keystone antibiotic and Aquacel to the right plantar foot wound. She has no issues or complaints today. 12/13; patient presents for follow-up. She has finished 5 Apligraf placements. Was told she would not qualify for more. We have been doing a total contact cast to the left lower extremity. She has been using Keystone antibiotic and Aquacel to the right plantar foot wound. She has no issues or complaints today. 12/20; patient presents for follow-up. We  have been using Hydrofera Blue with Keystone antibiotic under a total contact cast of the left lower extremity. She reports using Keystone antibiotic and silver alginate to the right heel wound. She has no issues or complaints today. 12/27; patient presents with a healthy wound on the left midfoot. We have Apligraf to apply that to that more also using a total contact cast. On the right we are using Keystone and silver alginate. She is offloading the right heel with a surgical shoealthough by her admission she is on her feet quite a bit 1/3; patient presents for follow-up. Apligraf was placed to the wound beds last clinic visit. She was placed in a total contact cast to the left lower extremity. She declines a total contact cast today. She states that her mother is in the hospital and she cannot adequately get around with the cast on. 1/10; patient presents for follow-up. She declined the total contact cast at last clinic visit. Both wounds have declined in appearance. She states that she has been on her feet and not offloading the wound beds. She currently denies signs of infection. 1/17; patient presents for follow-up. She had the total contact cast along with Apligraf placed last week to the left lower extremity. She tolerated this well. She has been using Aquacel Ag and Keystone antibiotic to the right heel wound. She currently denies signs of infection. She has no issues or complaints today. 1/24; patient presents for follow-up. We have been using Apligraf to the left foot wound along with a total contact cast. She has done well with this. T the right o heel wound she has been using Aquacel Ag and Keystone antibiotic ointment. She has no issues or complaints today. She denies signs of infection. CARRAH, BOYNES A ([id number]) [id number] Page 15 of 17 1/31; patient presents for follow-up. We have been using Apligraf to the left foot wound along with the total contact  cast. She continues to do well with this. To the right heel we have been using Aquacel Ag and Keystone antibiotic ointment. She has no issues or complaints today. 12-01-2022 upon evaluation patient is seen today on my schedule due to the fact that she unfortunately was in the hospital yesterday. Her cast needed to come off the only reason she is out of the hospital is due to the fact that they would not take it off at the hospital which is somewhat bewildered reading to me to be perfectly honest. I am not certain why this was but either way she was released and then was placed on my schedule today in order to  get this off and reapply the total contact casting as appropriate. I do not have an Apligraf for her today it was applied last week and today's actually expired yesterday as there was some scheduling conflicts with her being in the hospital. Nonetheless we do not have that for reapplication today but the good news that she is not draining too much and the Apligraf can go for up to 2 weeks so I am going to go ahead and reapply the total contact casting but we are going to leave the Apligraf in place. 2/14; patient presents for follow-up. T the left leg she has had the total contact cast and Apligraf for the past week. She has had no issues with this. T the o o right heel she has been using antibiotic ointment and Aquacel Ag. 2/21; patient presents for follow-up. We have been using Apligraf and a total contact cast to the left lower extremity. She is tolerated this well. Unfortunately she is not approved for any more Apligraf per insurance. She has been using antibiotic ointment and Aquacel Ag to the right foot. She has no issues or complaints today. 2/28; patient presents for follow-up. We have been using Hydrofera Blue and antibiotic ointment under the total contact cast to the left lower extremity. He has been using Aquacel Ag with antibiotic ointment to the right plantar foot. She has no issues  or complaints today. 3/6; patient presents for follow-up. She did not obtain her gentamicin ointment. She has been using Aquacel Ag to the right plantar foot wound. We have been using Hydrofera Blue with antibiotic ointment under the total contact cast to the left lower extremity. She has no issues or complaints today. 3/12; patient presents for follow-up. She has been using gentamicin ointment and Aquacel Ag to the right plantar foot wound. We have been using Hydrofera Blue with antibiotic ointment under the total contact cast on the left lower extremity foot wound. She has no issues or complaints today. 3/20; patient presents for follow-up. She has been using gentamicin ointment and Aquacel Ag to the right plantar foot wound. We have been using Hydrofera Blue with antibiotic ointment under the total contact cast to the left lower extremity wound. She has no issues or complaints today. 3/27; patient presents for follow-up. We have been using antibiotic ointment and Aquacel Ag to the right plantar foot wound. We have been using Hydrofera Blue with antibiotic ointment under the total contact cast to the left lower extremity. Both wounds are smaller. 01-24-2023 upon evaluation today patient actually appears to be doing better compared to last time I saw her. It has been several weeks since I last saw her but nonetheless I think the total contact casting is doing a good job. Fortunately I do not see any evidence of infection or worsening overall which is great news and in general I do believe that she is moving in the appropriate direction. It has been a very long road but nonetheless I feel like she is nearing the end at least in regard to the left foot and even the right foot looks much better to be perfectly honest. 01-31-2023 upon evaluation today patient appears to be doing well currently in regard to her wounds. She has been require some sharp debridement which I think is probably bone both sides to  remove a lot of callus that has built up at this point. I discussed that with her today. Also think total contact cast is doing well for the left foot  Emina continue that as well. 4/17; patient presents for follow-up. We have been doing Hydrofera Blue with antibiotic ointment under the total contact cast to the left lower extremity and Aquacel Ag with antibiotic ointment to the right foot wound. She has been using her offloading heel shoe here. 4/24; patient presents for follow-up. We have been doing Hydrofera Blue with antibiotic ointment under the total contact cast to the left lower extremity and Aquacel Ag with antibiotic ointment to the right foot wound. She has been using her offloading heel shoe here. Wounds are smaller with slough accumulation. 5/1; patient presents for follow-up. We have been doing Hydrofera Blue with antibiotic ointment under the total contact cast to the left lower extremity and Aquacel Ag with antibiotic ointment to the right foot wound. Left foot wound is stable and right foot wound appears smaller. 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 On the left she is using Hydrofera Blue topical antibiotic under a total contact cast, on the right plantar heel gentamicin Aquacel Ag. According to our intake nurse both are making slow but steady improvements. Objective Constitutional Vitals Time Taken: 11:11 AM, Height: 69 in, Weight: 178 lbs, BMI: 26.3, Temperature: 98.4 F, Pulse: 67 bpm, Respiratory Rate: 16 breaths/min, Blood Pressure: 110/73 mmHg. Cardiovascular Pedal pulses absent bilaterally.. General Notes: Wound exam; right foot plantar heel small probing area with some depth although this is apparently improving there is no surrounding erythema no purulence no drainage. On the left foot there is a fair amount of callus around this wound. Surface of it looks healthy however no drainage no evidence of tenderness Integumentary (Hair, Skin) Wound #11 status is Open.  Original cause of wound was Surgical Injury. The date acquired was: 12/01/2021. The wound has been in treatment 50 weeks. The wound is located on the Right,Plantar Foot. The wound measures 0.2cm length x 0.2cm width x 0.3cm depth; 0.031cm^2 area and 0.009cm^3 volume. There is Fat Layer (Subcutaneous Tissue) exposed. There is a small amount of serous drainage noted. The wound margin is distinct with the outline attached to the wound base. There is no granulation within the wound bed. There is a medium (34-66%) amount of necrotic tissue within the wound bed including Adherent Slough. Wound #12 status is Open. Original cause of wound was Pressure Injury. The date acquired was: 03/16/2020. The wound has been in treatment 50 weeks. The wound is located on the Left,Medial,Plantar Foot. The wound measures 0.7cm length x 0.4cm width x 0.2cm depth; 0.22cm^2 area and 0.044cm^3 volume. There is Fat Layer (Subcutaneous Tissue) exposed. There is a small amount of serous drainage noted. The wound margin is flat and intact. There is large (67- 100%) red, pink granulation within the wound bed. There is a small (1-33%) amount of necrotic tissue within the wound bed including Adherent Slough. 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 There was a T Contact otal Cast Procedure by Maxwell Caul, MD. Post procedure  Diagnosis Wound #12: Same as Pre-Procedure Plan  Follow-up Appointments: Return Appointment in 1 week. Nurse Visit as needed Bathing/ Shower/ Hygiene: May shower with wound dressing protected with water repellent cover or cast protector. No tub bath. Anesthetic (Use 'Patient Medications' Section for Anesthetic Order Entry): Lidocaine applied to wound bed Edema Control - Lymphedema / Segmental Compressive Device / Other: Elevate, Exercise Daily and Avoid Standing for Long Periods of Time. Elevate legs to the level of the heart and pump ankles as often as possible Elevate leg(s) parallel to the floor when sitting. Off-Loading: T Contact Cast to Left Lower Extremity otal Open toe surgical shoe - Heel off loader - Right foot Other: - keep pressure off of feet. Additional Orders / Instructions: Follow Nutritious Diet and Increase Protein Intake Other: - Stay off of foot, wheelchair only WOUND #11: - Foot Wound Laterality: Plantar, Right Cleanser: Wound Cleanser 1 x Per Day/30 Days Discharge Instructions: Wash your hands with soap and water. Remove old dressing, discard into plastic bag and place into trash. Cleanse the wound with Wound Cleanser prior to applying a clean dressing using gauze sponges, not tissues or cotton balls. Do not scrub or use excessive force. Pat dry using gauze sponges, not tissue or cotton balls. Topical: Gentamicin 1 x Per Day/30 Days Discharge Instructions: Apply as directed by provider. in clinic Prim Dressing: AquacelAg Advantage Dressing, 2X2 (in/in) 1 x Per Day/30 Days ary Discharge Instructions: Apply to wound as directed Secondary Dressing: ABD Pad 5x9 (in/in) (Generic) 1 x Per Day/30 Days Discharge Instructions: Cover with ABD pad Secured With: Medipore T - 41M Medipore H Soft Cloth Surgical T ape ape, 2x2 (in/yd) 1 x Per Day/30 Days Secured With: [employer] Sterile or Non-Sterile 6-ply 4.5x4 (yd/yd) 1 x Per Day/30 Days Discharge Instructions: Apply  Kerlix as directed WOUND #12: - Foot Wound Laterality: Plantar, Left, Medial Cleanser: Wound Cleanser 1 x Per Week/30 Days Discharge Instructions: Wash your hands with soap and water. Remove old dressing, discard into plastic bag and place into trash. Cleanse the wound with Wound Cleanser prior to applying a clean dressing using gauze sponges, not tissues or cotton balls. Do not scrub or use excessive force. Pat dry using gauze sponges, not tissue or cotton balls. Prim Dressing: Hydrofera Blue Ready Transfer Foam, 2.5x2.5 (in/in) 1 x Per Week/30 Days ary Discharge Instructions: Apply Hydrofera Blue Ready to wound bed as directed Secondary Dressing: ABD Pad 5x9 (in/in) (Generic) 1 x Per Week/30 Days Discharge Instructions: Cover with ABD pad Secured With: Medipore T - 41M Medipore H Soft Cloth Surgical T ape ape, 2x2 (in/yd) 1 x Per Week/30 Days Secured With: [employer] Sterile or Non-Sterile 6-ply 4.5x4 (yd/yd) 1 x Per Week/30 Days Discharge Instructions: Apply Kerlix as directed 1. I did not change the dressing things seem to be smaller 2. T contact cast on the left repeat reapplied in the standard fashion otal 3. No debridement today however she may need the callus on the left pared from around the wound edges if there is not progression Electronic Signature(s) KENLYNN, DELGARDO A ([id number]) [phone].pdf Page 17 of 17 Signed: 03/01/2023 4:41:35 PM By: Baltazar Najjar MD Entered By: Baltazar Najjar on 03/01/2023 12:08:05 -------------------------------------------------------------------------------- Total Contact Cast Details Patient Name: Date of Service: [name] 03/01/2023 11:00 A M Medical Record Number: [id number] Patient Account Number: [id number] Date of Birth/Sex: Treating RN: 1976/05/12 (47 y.o. Ginette Pitman Primary Care Provider: [name] Other Clinician: [name] Referring Provider: Treating Provider/Extender: RO BSO N, MICHA  EL Adela Lank, Georgiann Hahn  in Treatment: 50 T Contact Cast Applied for Wound Assessment: otal Wound #12 Left,Medial,Plantar Foot Performed By: Physician Maxwell Caul, MD Post Procedure Diagnosis Same as Pre-procedure Electronic Signature(s) Signed: 03/01/2023 4:41:35 PM By: Baltazar Najjar MD Entered By: Baltazar Najjar on 03/01/2023 12:10:41 -------------------------------------------------------------------------------- SuperBill Details Patient Name: Date of Service: [name] 03/01/2023 Medical Record Number: [id number] Patient Account Number: [id number] Date of Birth/Sex: Treating RN: 1976-10-05 (47 y.o. Ginette Pitman Primary Care Provider: [name] Other Clinician: [name] Referring Provider: Treating Provider/Extender: Chauncey Mann, MICHA EL Adela Lank, [name] Weeks in Treatment: 50 Diagnosis Coding ICD-10 Codes Code Description [phone] Non-pressure chronic ulcer of other part of left foot with other specified severity 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.621 Type 2 diabetes mellitus with foot ulcer E11.42 Type 2 diabetes mellitus with diabetic polyneuropathy L97.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 of skin Z79.4 Long term (current) use of insulin Facility Procedures : CPT4 Code: [id number] Description: 29445 - APPLY TOTAL CONTACT LEG CAST ICD-10 Diagnosis Description 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 Modifier: ity Quantity: 1 Physician Procedures : CPT4 Code Description Modifier [id number] - WC PHYS APPLY TOTAL CONTACT CAST ICD-10 Diagnosis Description 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ity Quantity: 1 Electronic Signature(s) Signed: 03/01/2023 4:41:35 PM By: Baltazar Najjar MD Entered By: Baltazar Najjar on 03/01/2023 12:08:22

## 2023-03-08 ENCOUNTER — Encounter (HOSPITAL_BASED_OUTPATIENT_CLINIC_OR_DEPARTMENT_OTHER): Payer: Medicaid Other | Admitting: Internal Medicine

## 2023-03-08 DIAGNOSIS — L97512 Non-pressure chronic ulcer of other part of right foot with fat layer exposed: Secondary | ICD-10-CM

## 2023-03-08 DIAGNOSIS — E11621 Type 2 diabetes mellitus with foot ulcer: Secondary | ICD-10-CM

## 2023-03-08 DIAGNOSIS — L97528 Non-pressure chronic ulcer of other part of left foot with other specified severity: Secondary | ICD-10-CM

## 2023-03-15 ENCOUNTER — Encounter (HOSPITAL_BASED_OUTPATIENT_CLINIC_OR_DEPARTMENT_OTHER): Payer: Medicaid Other | Admitting: Internal Medicine

## 2023-03-15 DIAGNOSIS — L97512 Non-pressure chronic ulcer of other part of right foot with fat layer exposed: Secondary | ICD-10-CM | POA: Diagnosis not present

## 2023-03-15 DIAGNOSIS — E11621 Type 2 diabetes mellitus with foot ulcer: Secondary | ICD-10-CM | POA: Diagnosis not present

## 2023-03-15 DIAGNOSIS — L97528 Non-pressure chronic ulcer of other part of left foot with other specified severity: Secondary | ICD-10-CM

## 2023-03-17 DIAGNOSIS — I340 Nonrheumatic mitral (valve) insufficiency: Secondary | ICD-10-CM | POA: Diagnosis not present

## 2023-03-22 ENCOUNTER — Encounter: Payer: Medicaid Other | Admitting: Internal Medicine

## 2023-03-23 ENCOUNTER — Other Ambulatory Visit: Payer: Medicaid Other

## 2023-03-24 ENCOUNTER — Ambulatory Visit: Payer: Medicaid Other | Admitting: Physician Assistant

## 2023-03-27 ENCOUNTER — Encounter: Payer: Medicaid Other | Attending: Physician Assistant | Admitting: Physician Assistant

## 2023-03-27 ENCOUNTER — Ambulatory Visit: Payer: Medicaid Other

## 2023-03-27 DIAGNOSIS — E11622 Type 2 diabetes mellitus with other skin ulcer: Secondary | ICD-10-CM | POA: Diagnosis not present

## 2023-03-27 DIAGNOSIS — Z794 Long term (current) use of insulin: Secondary | ICD-10-CM | POA: Diagnosis not present

## 2023-03-27 DIAGNOSIS — E1142 Type 2 diabetes mellitus with diabetic polyneuropathy: Secondary | ICD-10-CM | POA: Diagnosis not present

## 2023-03-27 DIAGNOSIS — L97512 Non-pressure chronic ulcer of other part of right foot with fat layer exposed: Secondary | ICD-10-CM | POA: Insufficient documentation

## 2023-03-27 DIAGNOSIS — L97522 Non-pressure chronic ulcer of other part of left foot with fat layer exposed: Secondary | ICD-10-CM | POA: Insufficient documentation

## 2023-03-27 DIAGNOSIS — L97528 Non-pressure chronic ulcer of other part of left foot with other specified severity: Secondary | ICD-10-CM | POA: Diagnosis not present

## 2023-03-27 DIAGNOSIS — E11621 Type 2 diabetes mellitus with foot ulcer: Secondary | ICD-10-CM | POA: Diagnosis present

## 2023-03-27 DIAGNOSIS — L97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 of skin: Secondary | ICD-10-CM | POA: Diagnosis not present

## 2023-03-27 DIAGNOSIS — I5022 Chronic systolic (congestive) heart failure: Secondary | ICD-10-CM | POA: Diagnosis not present

## 2023-03-27 NOTE — Progress Notes (Signed)
AMIAYAH, KUNKLER [name] ([id number]) [id number] Page 1 of 2 Visit Report for 03/27/2023 Chief Complaint Document Details Patient Name: Date of Service: [name], [name] 03/27/2023 10:30 [name]: [id number] Patient Account Number: [id number] Date of Birth/Sex: Treating RN: 05-02-1976 (47 y.o. Esmeralda Links Primary Care Provider: [name] Other Clinician: Referring Provider: Treating Provider/Extender: [name] in Treatment: 23 Information Obtained from: Patient Chief Complaint 03/19/2021; patient referred by Dr. Excell Seltzer who has been looking after her left foot for quite [name] period of time for review of [name] nonhealing area in the left midfoot 03/12/2022; bilateral feet wounds and right lower extremity wound. Electronic Signature(s) Signed: 03/27/2023 10:21:55 AM By: [name] PA-C Entered By: [name] on 03/27/2023 10:21:54 -------------------------------------------------------------------------------- Problem List Details Patient Name: Date of Service: Heather Jefferson [name]. 03/27/2023 10:30 [name]: [id number] Patient Account Number: [id number] Date of Birth/Sex: Treating RN: April 06, 1976 (47 y.o. Esmeralda Links Primary Care Provider: [name] Other Clinician: Referring Provider: Treating Provider/Extender: [name] in Treatment: 10 Active Problems ICD-10 Encounter Code Description 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 03/16/2022 No Yes E11.621 Type 2 diabetes mellitus with foot ulcer 03/16/2022 No Yes E11.42 Type 2 diabetes mellitus with diabetic polyneuropathy 03/16/2022 No Yes L97.811 Non-pressure chronic ulcer of other part of right lower leg limited to 03/16/2022 No Yes breakdown of  skin Z79.4 Long term (current) use of insulin 02/22/2023 No Yes Inactive Problems Resolved Problems [name], [name] ([id number]) [id number] Page 2 of 2 Electronic Signature(s) Signed: 03/27/2023 10:21:44 AM By: [name] PA-C Entered By: [name] on 03/27/2023 10:21:44

## 2023-04-05 ENCOUNTER — Encounter (HOSPITAL_BASED_OUTPATIENT_CLINIC_OR_DEPARTMENT_OTHER): Payer: Medicaid Other | Admitting: Internal Medicine

## 2023-04-05 DIAGNOSIS — E11621 Type 2 diabetes mellitus with foot ulcer: Secondary | ICD-10-CM | POA: Diagnosis not present

## 2023-04-05 DIAGNOSIS — L97512 Non-pressure chronic ulcer of other part of right foot with fat layer exposed: Secondary | ICD-10-CM

## 2023-04-05 DIAGNOSIS — L97528 Non-pressure chronic ulcer of other part of left foot with other specified severity: Secondary | ICD-10-CM | POA: Diagnosis not present

## 2023-04-08 NOTE — Progress Notes (Signed)
OLANI, JARACZ A ([id number]) [id number] Page 1 of 10 Visit Report for 04/05/2023 Arrival Information Details Patient Name: Date of Service: [name], [name] 04/05/2023 11:00 A M Medical Record Number: [id number] Patient Account Number: [id number] Date of Birth/Sex: Treating RN: July 01, 1976 (47 y.o. Skip Mayer Primary Care Jana Swartzlander: [name] Other Clinician: [name] Referring Rontavious Albright: Treating Mervil Wacker/Extender: Chapman Moss in Treatment: 43 Visit Information History Since Last Visit All ordered tests and consults were completed: No Patient Arrived: Wheel Chair Added or deleted any medications: No Arrival Time: 11:01 Any new allergies or adverse reactions: No Transfer Assistance: EasyPivot Patient Lift Had a fall or experienced change in No Patient Identification Verified: Yes activities of daily living that may affect Secondary Verification Process Completed: Yes risk of falls: Patient Requires Transmission-Based Precautions: No Signs or symptoms of abuse/neglect since No Patient Has Alerts: No last visito Hospitalized since last visit: No Implantable device outside of the clinic No excluding cellular tissue based products placed in the center since last visit: Has Dressing in Place as Prescribed: Yes Has Footwear/Offloading in Place as Yes Prescribed: Left: Surgical Shoe with Pressure Relief Insole Right: Wedge Shoe Pain Present Now: No Electronic Signature(s) Signed: 04/05/2023 4:43:31 PM By: [name] Entered By: [name] on 04/05/2023 11:08:00 -------------------------------------------------------------------------------- Clinic Level of Care Assessment Details Patient Name: Date of Service: [name], [name] 04/05/2023 11:00 A M Medical Record Number: [id number] Patient Account Number: [id number] Date of Birth/Sex: Treating RN: 27-Aug-1976 (47 y.o. Skip Mayer Primary Care Mordechai Matuszak:  [name] Other Clinician: [name] Referring Kelen Laura: Treating Pryor Guettler/Extender: Chapman Moss in Treatment: 81 Clinic Level of Care Assessment Items NAJMA, WELLMAN A ([id number]) [id number] Page 2 of 10 TOOL 1 Quantity Score []  - 0 Use when EandM and Procedure is performed on INITIAL visit ASSESSMENTS - Nursing Assessment / Reassessment []  - 0 General Physical Exam (combine w/ comprehensive assessment (listed just below) when performed on new pt. evals) []  - 0 Comprehensive Assessment (HX, ROS, Risk Assessments, Wounds Hx, etc.) ASSESSMENTS - Wound and Skin Assessment / Reassessment []  - 0 Dermatologic / Skin Assessment (not related to wound area) ASSESSMENTS - Ostomy and/or Continence Assessment and Care []  - 0 Incontinence Assessment and Management []  - 0 Ostomy Care Assessment and Management (repouching, etc.) PROCESS - Coordination of Care []  - 0 Simple Patient / Family Education for ongoing care []  - 0 Complex (extensive) Patient / Family Education for ongoing care []  - 0 Staff obtains Chiropractor, Records, T Results / Process Orders est []  - 0 Staff telephones HHA, Nursing Homes / Clarify orders / etc []  - 0 Routine Transfer to another Facility (non-emergent condition) []  - 0 Routine Hospital Admission (non-emergent condition) []  - 0 New Admissions / Manufacturing engineer / Ordering NPWT Apligraf, etc. , []  - 0 Emergency Hospital Admission (emergent condition) PROCESS - Special Needs []  - 0 Pediatric / Minor Patient Management []  - 0 Isolation Patient Management []  - 0 Hearing / Language / Visual special needs []  - 0 Assessment of Community assistance (transportation, D/C planning, etc.) []  - 0 Additional assistance / Altered mentation []  - 0 Support Surface(s) Assessment (bed, cushion, seat, etc.) INTERVENTIONS - Miscellaneous []  - 0 External ear exam []  - 0 Patient Transfer (multiple  staff / Nurse, adult / Similar devices) []  - 0 Simple Staple / Suture removal (25 or less) []  - 0 Complex Staple / Suture removal (26 or more) []  - 0 Hypo/Hyperglycemic Management (do not check  if billed separately) []  - 0 Ankle / Brachial Index (ABI) - do not check if billed separately Has the patient been seen at the hospital within the last three years: Yes Total Score: 0 Level Of Care: ____ Electronic Signature(s) Signed: 04/05/2023 4:43:31 PM By: [name] Entered By: [name] on 04/05/2023 11:43:59 Earmon Phoenix A ([id number]) [id number] Page 3 of 10 -------------------------------------------------------------------------------- Encounter Discharge Information Details Patient Name: Date of Service: [name], [name] 04/05/2023 11:00 A M Medical Record Number: [id number] Patient Account Number: [id number] Date of Birth/Sex: Treating RN: [birth year] (47 y.o. Skip Mayer Primary Care Shahin Knierim: [name] Other Clinician: [name] Referring Desia Saban: Treating Ashlyn Cabler/Extender: Chapman Moss in Treatment: 73 Encounter Discharge Information Items Post Procedure Vitals Discharge Condition: Stable Temperature (F): 98.2 Ambulatory Status: Wheelchair Pulse (bpm): 105 Discharge Destination: Home Respiratory Rate (breaths/min): 16 Transportation: Private Auto Blood Pressure (mmHg): 103/73 Accompanied By: mother Schedule Follow-up Appointment: Yes Clinical Summary of Care: Electronic Signature(s) Signed: 04/05/2023 4:43:31 PM By: [name] Entered By: [name] on 04/05/2023 12:20:50 -------------------------------------------------------------------------------- Lower Extremity Assessment Details Patient Name: Date of Service: [name], [name] 04/05/2023 11:00 A M Medical Record Number: [id number] Patient Account Number: [id number] Date of Birth/Sex: Treating RN: 09-24-1976 (47 y.o. Skip Mayer Primary Care Kavita Bartl: [name] Other Clinician: [name] Referring Emeric Novinger: Treating Asusena Sigley/Extender: [name] Weeks in Treatment: 55 Edema Assessment Assessed: [Left: No] [Right: No] [Left: Edema] [Right: :] Calf Left: Right: Point of Measurement: 31 cm From Medial Instep Ankle Left: Right: Point of Measurement: 10 cm From Medial Instep Electronic Signature(s) Signed: 04/05/2023 4:43:31 PM By: [name] Signed: 04/07/2023 12:16:56 PM By: Elliot Gurney, BSN, RN, CWS, [name] RN, BSN Entered By: [name] on 04/05/2023 11:17:24 Earmon Phoenix A ([id number]) [id number] Page 4 of 10 -------------------------------------------------------------------------------- Multi Wound Chart Details Patient Name: Date of Service: [name], [name] 04/05/2023 11:00 A M Medical Record Number: [id number] Patient Account Number: [id number] Date of Birth/Sex: Treating RN: 1976/10/14 (47 y.o. 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: Extremity Chronic sinus problems/congestion, Chronic sinus problems/congestion, N/A Comorbid History: Middle ear problems, Anemia, Chronic Middle ear problems, Anemia, Chronic Obstructive Pulmonary Disease Obstructive Pulmonary Disease (COPD), Congestive Heart Failure, (COPD), Congestive Heart Failure, Type II Diabetes, End Stage Renal Type II Diabetes, End Stage  Renal Disease, History of pressure wounds, Disease, History of pressure wounds, Neuropathy Neuropathy 12/01/2021 03/16/2020 N/A Date Acquired: 3 55 N/A Weeks of Treatment: Open Open N/A Wound Status: No No N/A Wound Recurrence: 0.1x0.1x0.3 1.2x0.9x0.1 N/A Measurements L x W x D (cm) 0.008 0.848 N/A A (cm) : rea 0.002 0.085 N/A Volume (cm) : 99.60% 82.00% N/A % Reduction in Area: 99.90% 91.00% N/A % Reduction in Volume: Full Thickness Without Exposed Grade 3 N/A Classification: Support Structures Small Medium N/A Exudate Amount: Serous Serosanguineous N/A Exudate Type: amber red, brown N/A Exudate Color: Distinct, outline attached Flat and Intact N/A Wound Margin: Small (1-33%) Large (67-100%) N/A Granulation Amount: Pink, Pale Pink, Pale N/A Granulation Quality: Large (67-100%) Small (1-33%) N/A Necrotic Amount: Fat Layer (Subcutaneous Tissue): Yes Fat Layer (Subcutaneous  Tissue): Yes N/A Exposed Structures: Fascia: No Fascia: No Tendon: No Tendon: No Muscle: No Muscle: No Joint: No Joint: No Bone: No Bone: No Medium (34-66%) None N/A Epithelialization: Treatment Notes NELLYE, WALCUTT A ([id number]) [phone] [id number].pdf Page 5 of 10 Electronic Signature(s) Signed: 04/05/2023 4:43:31 PM By: [name] Entered By: [name] on 04/05/2023 11:17:30 -------------------------------------------------------------------------------- Multi-Disciplinary Care Plan Details Patient Name: Date of Service: [name], [name] 04/05/2023 11:00 A M Medical Record Number: [id number] Patient Account Number: [id number] Date of Birth/Sex: Treating RN: 04-02-1976 (47 y.o. Cathlean Cower, [name] Primary Care Sabree Nuon: [name] Other Clinician: [name] Referring Ridwan Bondy: Treating Taiten Brawn/Extender: Chapman Moss in Treatment: 29 Active Inactive Abuse / Safety / Falls / Self Care Management Nursing Diagnoses: History of  Falls Potential for falls Potential for injury related to falls Goals: Patient will not develop complications from immobility Date Initiated: 03/17/2022 Date Inactivated: 04/20/2022 Target Resolution Date: 03/16/2022 Goal Status: Met Patient/caregiver will verbalize understanding of skin care regimen Date Initiated: 03/17/2022 Date Inactivated: 09/28/2022 Target Resolution Date: 03/16/2022 Goal Status: Met Patient/caregiver will verbalize/demonstrate measure taken to improve self care Date Initiated: 03/17/2022 Target Resolution Date: 04/20/2022 Goal Status: Active Interventions: Assess fall risk on admission and as needed Provide education on basic hygiene Provide education on personal and home safety Notes: Osteomyelitis Nursing Diagnoses: Infection: osteomyelitis Knowledge deficit related to disease process and management Goals: Patient/caregiver will verbalize understanding of disease process and disease management Date Initiated: 03/17/2022 Date Inactivated: 03/27/2023 Target Resolution Date: 03/16/2022 Goal Status: Met Patient's osteomyelitis will resolve Date Initiated: 03/17/2022 Target Resolution Date: 10/26/2022 Goal Status: Active Signs and symptoms for osteomyelitis will be recognized and promptly addressed Date Initiated: 03/17/2022 Target Resolution Date: 10/26/2022 Goal Status: Active Interventions: Assess for signs and symptoms of osteomyelitis resolution every visit Provide education on osteomyelitis TWYLA, VASKO A ([id number]) [id number] Page 6 of 10 Treatment Activities: Systemic antibiotics : 03/16/2022 Notes: Patient on antibiotics. Electronic Signature(s) Signed: 04/05/2023 4:43:31 PM By: [name] Signed: 04/07/2023 12:16:56 PM By: Elliot Gurney, BSN, RN, CWS, [name] RN, BSN Entered By: [name] on 04/05/2023 12:19:39 -------------------------------------------------------------------------------- Pain Assessment Details Patient  Name: Date of Service: Heather Jefferson A. 04/05/2023 11:00 A M Medical Record Number: [id number] Patient Account Number: [id number] Date of Birth/Sex: Treating RN: 06-30-1976 (47 y.o. Skip Mayer Primary Care Andrewjames Weirauch: [name] Other Clinician: [name] Referring Armonii Sieh: Treating Kaileen Bronkema/Extender: Chapman Moss in Treatment: 63 Active Problems Location of Pain Severity and Description of Pain Patient Has Paino No Site Locations Pain Management and Medication Current Pain Management: Electronic Signature(s) Signed: 04/05/2023 4:43:31 PM By: [name] Signed: 04/07/2023 12:16:56 PM By: Elliot Gurney, BSN, RN, CWS, [name] RN, BSN Entered By: [name] on 04/05/2023 11:10:10 Earmon Phoenix A ([id number]) [id number] Page 7 of 10 -------------------------------------------------------------------------------- Patient/Caregiver Education Details Patient Name: Date of Service: [name], [name] 6/12/2024andnbsp11:00 A M Medical Record Number: [id number] Patient Account Number: [id number] Date of Birth/Gender: Treating RN: 1976/01/11 (47 y.o. Skip Mayer Primary Care Physician: [name] Other Clinician: [name] Referring Physician: Treating Physician/Extender: Chapman Moss in Treatment: 23 Education Assessment Education Provided To: Patient Education Topics Provided Wound/Skin Impairment: Handouts: Other: continue wound care as directed Methods: Explain/Verbal Responses: State content correctly Electronic Signature(s) Signed: 04/05/2023 4:43:31 PM By: [name] Entered By: [name] on 04/05/2023 12:19:56 -------------------------------------------------------------------------------- Wound Assessment Details Patient Name: Date of Service: [name], [name] 04/05/2023 11:00 A M Medical Record Number: [id number] Patient Account Number: [id number] Date of Birth/Sex:  Treating RN: June 15, 1976 (47 y.o. Skip Mayer Primary Care Aarna Mihalko: [name] Other Clinician: [name] Referring Atlee Kluth: Treating Shivali Quackenbush/Extender: [name] Weeks in Treatment: 55 Wound Status Wound Number: 11 Primary Open Surgical Wound Etiology: Wound Location: Right, Plantar Foot Wound Open Wounding Event: Surgical Injury Status: Date Acquired: 12/01/2021 Comorbid Chronic sinus problems/congestion, Middle ear problems, Anemia, Weeks Of Treatment: 55 History: Chronic Obstructive Pulmonary Disease (COPD), Congestive Heart Clustered Wound: No Failure, Type II Diabetes, End Stage Renal Disease, History of pressure wounds, Neuropathy Photos FE, [place] A ([id number]) [id number] Page 8 of 10 Wound Measurements Length: (cm) 0.1 Width: (cm) 0.1 Depth: (cm) 0.3 Area: (cm) 0.008 Volume: (cm) 0.002 % Reduction in Area: 99.6% % Reduction in Volume: 99.9% Epithelialization: Medium (34-66%) Wound Description Classification: Full Thickness Without Exposed Suppo Wound Margin: Distinct, outline attached Exudate Amount: Small Exudate Type: Serous Exudate Color: amber rt Structures Foul Odor After Cleansing: No Slough/Fibrino Yes Wound Bed Granulation Amount: Small (1-33%) Exposed Structure Granulation Quality: Pink, Pale Fascia Exposed: No Necrotic Amount: Large (67-100%) Fat Layer (Subcutaneous Tissue) Exposed: Yes Necrotic Quality: Adherent Slough Tendon Exposed: No Muscle Exposed: No Joint Exposed: No Bone Exposed: No Treatment Notes Wound #11 (Foot) Wound Laterality: Plantar, Right Cleanser Wound Cleanser Discharge Instruction: Wash your hands with soap and water. Remove old dressing, discard into plastic bag and place into trash. Cleanse the wound with Wound Cleanser prior to applying a clean dressing using gauze sponges, not tissues or cotton balls. Do not scrub or use excessive force. Pat dry using  gauze sponges, not tissue or cotton balls. Heather-Wound Care Topical Primary Dressing Aquacel Extra Hydrofiber Dressing, 2x2 (in/in) Secondary Dressing ABD Pad 5x9 (in/in) Discharge Instruction: Cover with ABD pad Secured With Medipore T - 14M Medipore H Soft Cloth Surgical T ape ape, 2x2 (in/yd) Kerlix Roll Sterile or Non-Sterile 6-ply 4.5x4 (yd/yd) Discharge Instruction: Apply Kerlix as directed Compression Wrap Compression Stockings Add-Ons Electronic Signature(s) Signed: 04/05/2023 4:43:31 PM By: [name] Signed: 04/07/2023 12:16:56 PM By: Elliot Gurney, BSN, RN, CWS, [name] RN, BSN Entered By: [name] on 04/05/2023 11:16:30 Earmon Phoenix A ([id number]) [id number] Page 9 of 10 -------------------------------------------------------------------------------- Wound Assessment Details Patient Name: Date of Service: [name], [name] 04/05/2023 11:00 A M Medical Record Number: [id number] Patient Account Number: [id number] Date of Birth/Sex: Treating RN: 12/01/1975 (47 y.o. Cathlean Cower, [name] Primary Care Brileigh Sevcik: [name] Other Clinician: [name] Referring Khoi Hamberger: Treating Brantley Wiley/Extender: [name] Weeks in Treatment: 55 Wound Status Wound Number: 12 Primary Diabetic Wound/Ulcer of the Lower Extremity Etiology: Wound Location: Left, Medial, Plantar Foot Wound Open Wounding Event: Pressure Injury Status: Date Acquired: 03/16/2020 Comorbid Chronic sinus problems/congestion, Middle ear problems, Anemia, Weeks Of Treatment: 55 History: Chronic Obstructive Pulmonary Disease (COPD), Congestive Heart Clustered Wound: No Failure, Type II Diabetes, End Stage Renal Disease, History of pressure wounds, Neuropathy Photos Wound Measurements Length: (cm) 1.2 Width: (cm) 0.9 Depth: (cm) 0.1 Area: (cm) 0.848 Volume: (cm) 0.085 % Reduction in Area: 82% % Reduction in Volume: 91% Epithelialization: None Wound  Description Classification: Grade 3 Wound Margin: Flat and Intact Exudate Amount: Medium Exudate Type: Serosanguineous Exudate Color: red, brown Foul Odor After Cleansing: No Slough/Fibrino No Wound Bed Granulation Amount: Large (67-100%) Exposed Structure Granulation Quality: Pink, Pale Fascia Exposed: No Necrotic Amount: Small (1-33%) Fat Layer (Subcutaneous Tissue) Exposed: Yes Necrotic Quality: Adherent Slough Tendon Exposed: No Muscle Exposed: No Joint Exposed: No Bone Exposed: No Treatment Notes Wound #12 (Foot) Wound Laterality: Plantar, Left, Medial Cleanser Wound  Cleanser Discharge Instruction: Wash your hands with soap and water. Remove old dressing, discard into plastic bag and place into trash. Cleanse the KHRISTI, WAGLEY A ([id number]) [id number] Page 10 of 10 wound with Wound Cleanser prior to applying a clean dressing using gauze sponges, not tissues or cotton balls. Do not scrub or use excessive force. Pat dry using gauze sponges, not tissue or cotton balls. Heather-Wound Care Topical Gentamicin Discharge Instruction: Apply as directed by Merril Nagy. Primary Dressing Hydrofera Blue Ready Transfer Foam, 2.5x2.5 (in/in) Discharge Instruction: Apply Hydrofera Blue Ready to wound bed as directed Secondary Dressing heel cup Discharge Instruction: x2 for heel and front of foot Secured With Medipore T - 65M Medipore H Soft Cloth Surgical T ape ape, 2x2 (in/yd) Kerlix Roll Sterile or Non-Sterile 6-ply 4.5x4 (yd/yd) Discharge Instruction: Apply Kerlix as directed Compression Wrap Compression Stockings Add-Ons Electronic Signature(s) Signed: 04/05/2023 4:43:31 PM By: [name] Signed: 04/07/2023 12:16:56 PM By: Elliot Gurney, BSN, RN, CWS, [name] RN, BSN Entered By: [name] on 04/05/2023 11:17:01 -------------------------------------------------------------------------------- Vitals Details Patient Name: Date of Service: [name], [name]  04/05/2023 11:00 A M Medical Record Number: [id number] Patient Account Number: [id number] Date of Birth/Sex: Treating RN: 11/06/1975 (47 y.o. Cathlean Cower, [name] Primary Care Tamer Baughman: [name] Other Clinician: [name] Referring Allix Blomquist: Treating Bayla Mcgovern/Extender: Chapman Moss in Treatment: 79 Vital Signs Time Taken: 11:08 Temperature (F): 98.2 Height (in): 69 Pulse (bpm): 105 Weight (lbs): 178 Respiratory Rate (breaths/min): 16 Body Mass Index (BMI): 26.3 Blood Pressure (mmHg): 103/73 Reference Range: 80 - 120 mg / dl Electronic Signature(s) Signed: 04/05/2023 4:43:31 PM By: [name] Entered By: [name] on 04/05/2023 11:10:04

## 2023-04-08 NOTE — Progress Notes (Signed)
YUDI, PELCZAR [name] ([id number]) [id number] Page 1 of 23 Visit Report for 04/05/2023 Chief Complaint Document Details Patient Name: Date of Service: [name], [name] 04/05/2023 11:00 [name]: [id number] Patient Account Number: [id number] Date of Birth/Sex: Treating RN: 10-31-1975 (47 y.o. Skip Mayer Primary Care Provider: [name] Other Clinician: [name] Referring Provider: Treating Provider/Extender: Chapman Moss in Treatment: 30 Information Obtained from: Patient Chief Complaint 03/19/2021; patient referred by Dr. Excell Seltzer who has been looking after her left foot for quite [name] period of time for review of [name] nonhealing area in the left midfoot 03/12/2022; bilateral feet wounds and right lower extremity wound. Electronic Signature(s) Signed: 04/05/2023 4:14:13 PM By: Geralyn Corwin DO Entered By: Geralyn Corwin on 04/05/2023 12:55:33 -------------------------------------------------------------------------------- Debridement Details Patient Name: Date of Service: [name], [name] 04/05/2023 11:00 [name]: [id number] Patient Account Number: [id number] Date of Birth/Sex: Treating RN: August 19, 1976 (47 y.o. Cathlean Cower, [name] Primary Care Provider: [name] Other Clinician: [name] Referring Provider: Treating Provider/Extender: Chapman Moss in Treatment: 55 Debridement Performed for Assessment: Wound #12 Left,Medial,Plantar Foot Performed By: Physician Geralyn Corwin, MD Debridement Type: Debridement Severity of Tissue Pre Debridement: Fat layer exposed Level of Consciousness (Pre-procedure): Awake and Alert Pre-procedure Verification/Time Out Yes - 11:41 Taken: Start Time: 11:41 Percent of Wound Bed Debrided: 100% T Area Debrided (cm): otal 0.85 Tissue and other material debrided: Viable, Non-Viable, Callus, Slough, Slough Level: Non-Viable  Tissue Debridement Description: Selective/Open Wound Instrument: Curette Bleeding: Minimum Hemostasis Achieved: Pressure Response to Treatment: Procedure was tolerated well Level of Consciousness (Post- Awake and Alert [name], [name] ([id number]) [id number] Page 2 of 23 Awake and Alert procedure): Post Debridement Measurements of Total Wound Length: (cm) 1.2 Width: (cm) 0.9 Depth: (cm) 0.1 Volume: (cm) 0.085 Character of Wound/Ulcer Post Debridement: Stable Severity of Tissue Post Debridement: Fat layer exposed Post Procedure Diagnosis Same as Pre-procedure Electronic Signature(s) Signed: 04/05/2023 4:14:13 PM By: Geralyn Corwin DO Signed: 04/05/2023 4:43:31 PM By: [name] Signed: 04/07/2023 12:16:56 PM By: Elliot Gurney, BSN, RN, CWS, [name] RN, BSN Entered By: [name] on 04/05/2023 11:41:55 -------------------------------------------------------------------------------- Debridement Details Patient Name: Date of Service: Heather Jefferson [name]. 04/05/2023 11:00 [name]: [id number] Patient Account Number: [id number] Date of Birth/Sex: Treating RN: [birth year] (47 y.o. Cathlean Cower, [name] Primary Care Provider: [name] Other Clinician: [name] Referring Provider: Treating Provider/Extender: Chapman Moss in Treatment: 55 Debridement Performed for Assessment: Wound #11 Right,Plantar Foot Performed By: Physician Geralyn Corwin, MD Debridement Type: Debridement Level of Consciousness (Pre-procedure): Awake and Alert Pre-procedure Verification/Time Out Yes - 11:42 Taken: Start Time: 11:42 Percent of Wound Bed Debrided: 100% T Area Debrided (cm): otal 0.01 Tissue and other material debrided: Viable, Non-Viable, Callus, Slough, Skin: Dermis , Skin: Epidermis, Slough Level: Skin/Epidermis Debridement Description: Selective/Open Wound Instrument: Curette Bleeding: Minimum Hemostasis Achieved:  Pressure Response to Treatment: Procedure was tolerated well Level of Consciousness (Post- Awake and Alert procedure): Post Debridement Measurements of Total Wound Length: (cm) 0.1 Width: (cm) 0.1 Depth: (cm) 0.3 Volume: (cm) 0.002 Character of Wound/Ulcer Post Debridement: Stable Post Procedure Diagnosis Same as Pre-procedure Electronic Signature(s) Signed: 04/05/2023 4:14:13 PM By: Geralyn Corwin DO Signed: 04/05/2023 4:43:31 PM By: [name] Signed: 04/07/2023 12:16:56 PM By: Elliot Gurney, BSN, RN, CWS, [name] RN, BSN [name], [name] ([id number]) [phone].pdf Page 3 of 23 Signed: 04/07/2023 12:16:56 PM By: Elliot Gurney, BSN, RN, CWS, [name] RN, BSN Entered By: [name] on 04/05/2023  11:43:02 -------------------------------------------------------------------------------- HPI Details Patient Name: Date of Service: [name], [name] 04/05/2023 11:00 [name]: [id number] Patient Account Number: [id number] Date of Birth/Sex: Treating RN: 1975/11/28 (47 y.o. Skip Mayer Primary Care Provider: [name] Other Clinician: [name] Referring Provider: Treating Provider/Extender: Chapman Moss in Treatment: 61 History of Present Illness HPI Description: 01/18/18-She is here for initial evaluation of the left great toe ulcer. She is [name] poor historian in regards to timeframe in detail. She states approximately 4 weeks ago she lacerated her toe on something in the house. She followed up with her primary care who placed her on Bactrim and ultimately [name] second dose of Bactrim prior to coming to wound clinic. She states she has been treating the toe with peroxide, Betadine and [name] Band-Aid. She did not check her blood sugar this morning but checked it yesterday morning it was 327; she is unaware of [name] recent A1c and there are no current records. She saw Dr. she would've orthopedics last week for an old injury to the left ankle, she  states he did not see her toe, nor did she bring it to his attention. She smokes approximately 1 pack cigarettes [name] day. Her social situation is concerning, she arrives this morning with her mother who appears extremely intoxicated/under the influence; her mother was asked to leave the room and be monitored by the patient's grandmother. The patient's aunt then accompanied the patient and the room throughout the rest of the appointment. We had [name] lengthy discussion regarding the deleterious effects of uncontrolled hyperglycemia and smoking as it relates to wound healing and overall health. She was strongly encouraged to decrease her smoking and get her diabetes under better control. She states she is currently on [name] diet and has cut down her [facility] consumption. The left toe is erythematous, macerated and slightly edematous with malodor present. The edema in her left foot is below her baseline, there is no erythema streaking. We will treat her with Santyl, doxycycline; we have ordered and xray, culture and provided [name] Peg assist surgical shoe and cultured the wound. 01/25/18-She is here in follow-up evaluation for [name] left great toe ulcer and presents with an abscess to her suprapubic area. She states her blood sugars remain elevated, feeling "sick" and if levels are below 250, but she is trying. She has made no attempt to decrease her smoking stating that we "can't take away her food in her cigarettes". She has been compliant with offloading using the PEG assist you. She is using Santyl daily. the culture obtained last week grew staph aureus and Enterococcus faecalis; continues on the doxycycline and Augmentin was added on Monday. The suprapubic area has erythema, no femoral variation, purple discoloration, minimal induration, was accessed with [name] cotton tip applicator with sanguinopurulent drainage, this was cultured, I suspect the current antibiotic treatment will cover and we will not add anything to her  current treatment plan. She was advised to go to urgent care or ER with any change in redness, induration or fever. 02/01/18-She is here in follow-up evaluation for left great toe ulcers and [name] new abdominal abscess from last week. She was able to use packing until earlier this week, where she "forgot it was there". She states she was feeling ill with GI symptoms last week and was not taking her antibiotic. She states her glucose levels have been predominantly less than 200, with occasional levels between 200-250. She thinks this was contributing to her  GI symptoms as they have resolved without intervention. There continues to be significant laceration to left toe, otherwise it clinically looks stable/improved. There is now less superficial opening to the lateral aspect of the great toe that was residual blister. We will transition to [facility] to all wounds, she will continue her Augmentin. If there is no change or deterioration next week for reculture. 02/08/18-She is here in follow-up evaluation for left great toe ulcer and abdominal ulcer. There is an improvement in both wounds. She has been wrapping her left toe with coban, not by our direction, which has created an area of discoloration to the medial aspect; she has been advised to NOT use coban secondary to her neuropathy. She states her glucose levels have been high over this last week ranging from 200-350, she continues to smoke. She admits to being less compliant with her offloading shoe. We will continue with same treatment plan and she will follow-up next week. 02/15/18-She is here in follow-up evaluation for left great toe ulcer and abdominal ulcer. The abdominal ulcer is epithelialized. The left great toe ulcer is improved and all injury from last week using the Coban wrap is resolved, the lateral ulcer is healed. She admits to noncompliance with wearing offloading shoe and admits to glucose levels being greater than 300 most of the  week. She continues to smoke and expresses no desire to quit. There is one area medially that probes deeper than it has historically, erythema to the toe and dorsal foot has consistently waxed and waned. There is no overt signs of cellulitis or infection but we will culture the wound for any occult infection given the new area of depth and erythema. We will hold off on sensitivities for initiation of antibiotic therapy. 02/22/18-She is here in follow up evaluation for left great toe ulcer. There is overall significant improvement in both wound appearance, erythema and edema with changes made last week. She was not initiated on antibiotic therapy. Culture obtained last week showed oxacillin sensitive staph aureus, sensitive to clindamycin. Clindamycin has been called into the pharmacy but she has been instructed to hold off on initiation secondary to overall clinical improvement and her history of antibiotic intolerance. She has been instructed to contact the clinic with any noted changes/deterioration and the wound, erythema, edema and/or pain. She will follow-up next week. She continues to smoke and her glucose levels remain elevated >250; she admits to compliance with offloading shoe 03/01/18 on evaluation today patient appears to be doing fairly well in regard to her left first toe ulcer. She has been tolerating the dressing changes with the [facility] Dressing without complication and overall this has definitely showed signs of improvement according to records as well is what the patient tells me today. I'm very pleased in that regard. She is having no pain today 03/08/18 She is here for follow up evaluation of [name] left great toe ulcer. She remains non-compliant with glucose control and smoking cessation; glucose levels consistently >200. She states that she got new shoe inserts/peg assist. She admits to compliance with offloading. Since my last evaluation there is significant improvement. We will  switch to prisma at this time and she will follow up next week. She is noted to be tachycardic at this appointment, heart rate 120s; she has [name] history of heart rate 70-130 according to our records. She admits to extreme agitation r/t personal issues; she was advised to monitor her heartrate and contact her physician if it does not return to  [name] more normal range (<100). She takes cardizem twice daily. 03/15/18-She is here in follow-up evaluation for left great toe ulcer. She remains noncompliant with glucose control and smoking cessation. She admits to compliance with wearing offloading shoe. The ulcer is improved/stable and we will continue with the same treatment plan and she will follow-up next week 03/22/18-She is here for evaluation for left great toe ulcer. There continues to be significant improvement despite recurrent hyperglycemia (over 500 yesterday) and she continues to smoke. She has been compliant with offloading and we will continue with same treatment plan and she will follow-up next week. 03/29/18-She is here for evaluation for left great toe ulcer. Despite continuing to smoke and uncontrolled diabetes she continues to improve. She is compliant with offloading shoe. We will continue with the same treatment plan and she will follow-up next week 04/05/18- She is here in follow up evaluation for [name] left great toe ulcer; she presents with small pustule to left fifth toe (resembles ant bite). She admits to compliance with wearing offloading shoe; continues to smoke or have uncontrolled blood glucose control. There is more callus than usual with evidence of bleeding; she denies known trauma. [name], [name] ([id number]) [id number] Page 4 of 23 04/12/18-She is here for evaluation of left great toe ulcer. Despite noncompliance with glycemic control and smoking she continues to make improvement. She continues to wear offloading shoe. The pustule, that was identified last  week, to the left fifth toe is resolved. She will follow-up in 2 weeks 05/03/18-she is seen in follow-up evaluation for [name] left great toe ulcer. She is compliant with offloading, otherwise noncompliant with glycemic control and smoking. She has plateaued and there is minimal improvement noted. We will transition to [facility], replaced the insert to her surgical shoe and she will follow-up in one week 05/10/18- She is here in follow up evaluation for [name] left great toe ulcer. It appears stable despite measurement change. We will continue with same treatment plan and follow up next week. 05/24/18-She is seen in follow-up evaluation for [name] left great toe ulcer. She remains compliant with offloading, has made significant improvement in her diet, decreasing the amount of sugar/soda. She said her recent A1c was 10.9 which is lower than. She did see [name] diabetic nutritionist/educator yesterday. She continues to smoke. We will continue with the same treatment plan and she'll follow-up next week. 05/31/18- She is seen in follow-up evaluation for left great toe ulcer. She continues to remain compliant with offloading, continues to make improvement in her diet, increasing her water and decreasing the amount of sugar/soda. She does continue to smoke with no desire to quit. We will apply Prisma to the depth and Hydrofera Blue over. We have not received insurance authorization for oasis. She will follow up next week. 06/07/18-She is seen in follow-up evaluation for left great toe ulcer. It has stalled according to today's measurements although base appears stable. She says she saw [name] diabetic educator yesterday; her average blood sugars are less than 300 which is an improvement for her. She continues to smoke and states "that's my next step" She continues with water over soda. We will order for xray, culture and reinstate ace wrap compression prior to placing apligraf for next week. She is voicing no complaints or concerns.  Her dressing will change to iodoflex over the next week in preparation for apligraf. 06/14/18-She is seen in follow-up evaluation for left great toe ulcer. Plain film x-ray performed last week was negative for osteomyelitis.  Wound culture obtained last week grew strep B and OSSA; she is initiated on keflex and cefdinir today; there is erythema to the toe which could be from ace wrap compression, she has [name] history of wrapping too tight and has has been encouraged to maintain ace wraps that we place today. We will hold off on application of apligraf today, will apply next week after antibiotic therapy has been initiated. She admits today that she has resumed taking [name] shower with her foot/toe submerged in water, she has been reminded to keep foot/toe out of the bath water. She will be seen in follow up next week 06/21/18-she is seen in follow-up evaluation for left great toe ulcer. She is tolerating antibiotic therapy with no GI disturbance. The wound is stable. Apligraf was applied today. She has been decreasing her smoking, only had 4 cigarettes yesterday and 1 today. She continues being more compliant in diabetic diet. She will follow-up next week for evaluation of site, if stable will remove at 2 weeks. 06/28/18- She is here in follow up evalution. Apligraf was placed last week, she states the dressing fell off on Tuesday and she was dressing with hydrofera blue. She is healed and will be discharged from the clinic today. She has been instructed to continue with smoking cessation, continue monitoring glucose levels, offloading for an additional 4 weeks and continue with hydrofera blue for additional two weeks for any possible microscopic opening. Readmission: 08/07/18 on evaluation today patient presents for reevaluation concerning the ulcer of her right great toe. She was previously discharged on 06/28/18 healed. Nonetheless she states that this began to show signs of drainage she subsequently went to her  primary care provider. Subsequently an x-ray was performed on 08/01/18 which was negative. The patient was also placed on antibiotics at that time. Fortunately they should have been effective for the infection. Nonetheless she's been experiencing some improvement but still has [name] lot of drainage coming from the wound itself. 08/14/18 on evaluation today patient's wound actually does show signs of improvement in regard to the erythema at this point. She has completed the antibiotics. With that being said we did discuss the possibility of placing her in [name] total contact cast as of today although I think that I may want to give this just [name] little bit more time to ensure nothing recurrence as far as her infection is concerned. I do not want to put in the cast and risk infection at that time if things are not completely resolved. With that being said she is gonna require some debridement today. 08/21/18 on evaluation today patient actually appears to be doing okay in regard to her toe ulcer. She's been tolerating the dressing changes without complication. With that being said it does appear that she is ready and in fact I think it's appropriate for [place] to go ahead and initiate the total contact cast today. Nonetheless she will require some sharp debridement to prepare the wound for application. Overall I feel like things have been progressing well but we do need to do something to get this to close more readily. 08/24/18 patient seen today for reevaluation after having had the total contact cast applied on Tuesday. She seems to have done very well the wound appears to be doing great and overall I'm pleased with the progress that she's made. There were no abnormal areas of rubbing from the cast on her lower extremity. 08/30/18 on evaluation today patient actually appears to be completely healed in regard to  her plantar toe ulcer. She tells me at this point she's been having [name] lot of issues with the cast. She  almost fell [name] couple of times the state shall the step of her dog [name] couple times as well. This is been [name] very frustrating process for her other nonetheless she has completely healed the wound which is excellent news. Overall there does not appear to be the evidence of infection at this time which is great news. 09/11/18 evaluation today patient presents for follow-up concerning her great toe ulcer on the left which has unfortunately reopened since I last saw her which was only [name] couple of weeks ago. Unfortunately she was not able to get in to get the shoe and potentially the AFO that's gonna be necessary due to her left foot drop. She continues with offloading shoe but this is not enough to prevent her from reopening it appears. When we last had her in the total contact cast she did well from [name] healing standpoint but unfortunately the wound reopened as soon as she came out of the cast within just [name] couple of weeks. 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 This is also part of the reason why she falls on [name] regular basis. I just do not believe that is gonna be safe for [place] to reinitiate the total contact cast as last time we had this on she fell 3 times one week which is definitely not normal for her. 09/18/18 upon evaluation today the patient actually appears to be doing about the same in regard to her toe ulcer. She did not contact [facility] as I asked her to even though I had given her the prescription. In fact she actually states that she has no idea where the prescription is. She did apparently call [employer] and they told her that all she needed to do was bring the prescription in order to be able to be seen and work on getting the AFO for her left foot. With all that being said she still does not have an  appointment and I'm not sure were things stand that regard. I will give her [name] new prescription today in order to contact them to get this set up. 09/25/18 on evaluation today patient actually appears to be doing about the same in regard to her toes ulcer. She does have [name] small areas which seems to have [name] lot of callous buildup around the edge of the wound which is going to need sharp debridement today. She still is waiting to be scheduled for evaluation with [facility] for possibility of an AFO. She states there supposed to call her tomorrow to get this set up. Unfortunately it does appear that her foot specifically the toe area is showing signs of erythema. There does not appear to be any systemic infection which is in these good news. 10/02/18 on evaluation today patient actually appears to be doing about the same in regard to her toe ulcer. This really has not done too well although it's not significantly larger it's also not significantly smaller. She has been tolerating the dressing changes without complication. She actually has her appointment with [facility] and [facility] tomorrow to hopefully be measured for obtaining and AFO splint. I think this would be helpful preventing this from  reoccurring. We had contemplated starting the cast this week although to be honest I am reluctant to do that as she's been having nausea, vomiting, and seizure activity over the past three days. She has [name] history of seizures and have been told is nothing that can be done for these. With that being said I do believe that along with the seizures have the nausea vomiting which upon further questioning doesn't seem to be the normal for her and makes me concerned for the possibility of infection or something else going on. I discussed this with the patient and her mother during the office visit today. I do not feel the wound is effective but maybe something else. The responses this was "this just happens to her at times and we  don't know why". They did not seem to be interested in going to the hospital to have this checked out further. 10/09/18 on evaluation today patient presents for follow-up concerning her ongoing toe ulcer. She has been tolerating the dressing changes without complication. Fortunately there does not appear to be any evidence of infection which is great news however I do think that the patient would benefit from going ahead for with the total contact cast. She's actually in [name] wheelchair today she tells me that she will use her walker if we initiate the cast. I was very specific about the fact that if we were gonna do the cast I wanted to make sure that she was using the walker in order to prevent any falls. She tells me she does not have stairs that she has to traverse on [name] regular basis at her home. She has not had any seizures since last week again that something that happens to her often she tells me she did talk to [employer] and they said that it may take up to three weeks to get the brace approved for her. Hopefully that will not take that long but nonetheless in the meantime I do think the cast could be of benefit. [name], [name] ([id number]) [id number] Page 5 of 23 10/12/18 on evaluation today patient appears to be doing rather well in regard to her toe ulcer. It's just been [name] few days and already this is significantly improved both as far as overall appearance and size. Fortunately there's no sign of infection. She is here for her first obligatory cast change. 10/19/18 Seen today for follow up and management of left great toe ulcer. Wound continues to show improvement. Noted small open area with seroussang drainage with palpation. Denies any increased pain or recent fevers during visit. She will continue calcium alginate with offloading shoe. Denies any questions or concerns during visit. 10/26/18 on evaluation today patient appears to be doing about the same as when I  last saw her in regard to her wound bed. Fortunately there does not appear to be any signs of infection. Unfortunately she continues to have [name] breakdown in regard to the toe region any time that she is not in the cast. It takes almost no time at all for this to happen. Nonetheless she still has not heard anything from the brace being made by [employer] as to when exactly this will be available to her. Fortunately there is no signs of infection at this time. 10/30/18 on evaluation today patient presents for application of the total contact cast as we just received him this morning. Fortunately we are gonna be able to apply this to her today which is great news. She continues to have  no significant pain which is good news. Overall I do feel like things have been improving while she was the cast is when she doesn't have [name] cast that things get worse. She still has not really heard anything from [employer] regarding her brace. 11/02/18 upon evaluation today patient's wound already appears to be doing significantly better which is good news. Fortunately there does not appear to be any signs of infection also good news. Overall I do think the total contact cast as before is helping to heal this area unfortunately it's just not gonna likely keep the area closed and healed without her getting her brace at least. Again the foot drop is [name] significant issue for her. 11/09/18 on evaluation today patient appears to be doing excellent in regard to her toe ulcer which in fact is completely healed. Fortunately we finally got the situation squared away with the paperwork which was needed to proceed with getting her brace approved by Medicaid. I have filled that out unfortunately that information has been sent to the orthopedic office that I worked at 2 1/2 years ago and not tired Current wound care measures. Fortunately she seems to be doing very well at this time. 11/23/18 on evaluation today patient appears to be doing More  Poorly Compared to Last Time I Saw Her. At [facility] She Had Completely Healed. Currently she is continuing to have issues with reopening. She states that she just found out that the brace was approved through Medicaid now she just has to go get measured in order to have this fitted for her and then made. Subsequently she does not have an appointment for this yet that is going to complicate things we obviously cannot put her back in the cast if we do not have everything measured because they're not gonna be able to measure her foot while she is in the cast. 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 Obviously this is unfortunate and does have me somewhat worried as well. 11/30/18 on evaluation today patient's toe ulcer actually appears to be doing fairly well. The good news is she will be getting her brace in the shoes next week on Wednesday. Hopefully we will be able to get this to heal without having to go back in the cast however she may need the cast in order to get the wound completely heal and then go from there. Fortunately there's no signs of infection at this time. 12/07/18 on evaluation today patient fortunately did receive her brace and she states she could tell this definitely makes her walk better. With that being said she's been having issues with her toe where she noticed yesterday there was [name] lot of tissue that was loosing off this appears to be much larger than what it was previous. She also states that her leg has been read putting much across the top of her foot just about the ankle although this seems to be receiving somewhat. The total area is still red and appears to be someone infected as best I can tell. She is previously taken Bactrim and that may be [name] good option for her today as well. We are gonna see what I wound culture shows as well and I think that this is definitely  appropriate. With that being said outside of the culture I still need to initiate something in the interim and that's what I'm gonna  go ahead and select Bactrim is [name] good option for her. 12/14/18 on evaluation today patient appears to be doing better in regard to her left great toe ulcer as compared to last week's evaluation. There's still some erythema although this is significantly improved which is excellent news. Overall I do believe that she is making good progress is still gonna take some time before she is where I would like her to be from the standpoint of being able to place her back into the total contact cast. Hopefully we will be where we need to be by next week. 12/21/18 on evaluation today patient actually appears to be doing poorly in regard to her toe ulcer. She's been tolerating the dressing changes without complication. Fortunately there's no signs of systemic infection although she does have [name] lot of drainage from the toe ulcer and this does seem to be causing some issues at this point. She does have erythema on the distal portion of her toe that appears to be likely cellulitis. 12/28/18 on evaluation today patient actually appears to be doing [name] little better in my pinion in regard to her toe ulcer. With that being said she still does have some evidence of infection at this time and for her culture she had both E. coli as well as enterococcus as organisms noted on evaluation. For that reason I think that though the Keflex likely has treated the E. coli rather well this has really done nothing for the enterococcus. We are going to have to initiate treatment for this specifically. 01/04/19 on evaluation today patient's toe actually appears to be doing better from the standpoint of infection. She currently would like to see about putting the cash back on I think that this is appropriate as long as she takes care of it and keeps it from getting wet. She is gonna have some drainage we can  definitely pass this up with Drawtex and alginate to try to prevent as much drainage as possible from causing the problems. With that being said I do want to at least try her with the cast between now and Tuesday. If there any issues we can't continue to use it then I will discontinue the use of the cast at that point. 01/08/19 on evaluation today patient actually appears to be doing very well as far as her foot ulcer specifically the great toe on the left is concerned. She did have an area of rubbing on the medial aspect of her left ankle which again is from the cast. Fortunately there's no signs of infection at this point in this appears to be [name] very slight skin breakdown. The patient tells me she felt it rubbing but didn't think it was that bad. Fortunately there is no signs of active infection at this time which is good news. No fevers, chills, nausea, or vomiting noted at this time. 01/15/19 on evaluation today patient actually appears to be doing well in regard to her toe ulcer. Again as previous she seems to do well and she has the cast on which indicates to me that during the time she doesn't have [name] cast on she's putting way too much pressure on this region. Obviously I think that's gonna be an issue as with the current national emergency concerning the Covid-19 Virus it has been recommended that we discontinue the use of total contact casting by the chief medical officer of our company, Dr. Maurine Minister. The reasoning is that if [name] patient becomes sick and cannot come into  have the cast removed they could not just leave this on for an additional two weeks. Obviously the hospitals also do not want to receive patient's who are sick into the emergency department to potentially contaminate the region and spread the Covid-19 Virus among other sick individuals within the hospital system. Therefore at this point we are suspending the use of total contact cast until the current emergency subsides. This was all  discussed with the patient today as well. 01/22/19 on evaluation today patient's wound on her left great toe appears to be doing slightly worse than previously noted last week. She tells me that she has been on this quite [name] bit in fact she tells me she's been awake for 38 straight hours. This is due to the fact that she's having to care for grandparents because nobody else will. She has been taking care of them for five the last seven days since I've seen her they both have dementia his is from [name] stroke and her grandmother's was progressive. Nonetheless she states even her mom who knows her condition and situation has only help two of those days to take care of them she's been taking care of the rest. Fortunately there does not appear to be any signs of active infection in regard to her toe at this point although obviously it doesn't look as good as it did previous. I think this is directly related to her not taking off the pressure and friction by way of taking things easy. Though I completely understand what's going on. 01/29/19 on evaluation today patient's tools are actually appears to be showing some signs of improvement today compared to last week's evaluation as far as not necessarily the overall size of the wound but the fact that she has some new skin growth in between the two ends of the wound opening. Overall I feel like she has done well she states that she had [name] family member give her what sounds to be [name] CAM walker boot which has been helpful as well. 02/05/19 on evaluation today patient's wound bed actually appears to be doing significantly better in regard to her overall appearance of the size of the wound. With that being said she is still having an issue with offloading efficiently enough to get this to close. Apparently there is some signs of infection at this point as well unfortunately. Previously she's done well of Augmentin I really do not see anything that needs to be culture  currently but there theme and cellulitis of the foot that I'm seeing I'm gonna go ahead and place her on an antibiotic today to try to help clear this up. JERALYNN, WYSZYNSKI [name] ([id number]) [id number] Page 6 of 23 02/12/2019 on evaluation today patient actually appears to be doing poorly in regard to her overall wound status. She tells me she has been using her offloading shoe but actually comes in today wearing her tennis shoe with the AFO brace. Again as I previously discussed with her this is really not sufficient to allow the area to heal appropriately. Nonetheless she continues to be somewhat noncompliant and I do wonder based on what she has told my nurse in the past as to whether or not [name] good portion of this noncompliance may be recreational drug and alcohol related. She has had [name] history of heroin overdose and this was fairly recently in the past couple of months that have been seeing her. Nonetheless overall I feel like her wound looks significantly worse today compared to  what it was previous. She still has significant erythema despite the Augmentin I am not sure that this is an appropriate medication for her infection I am also concerned that the infection may have gone down into her bone. 02/19/19 on evaluation today patient actually appears to be doing about the same in regard to her toe ulcer. Unfortunately she continues to show signs of bone exposure and infection at this point. There does not appear to be any evidence of worsening of the infection but I'm also not really sure that it's getting significantly better. She is on the Augmentin which should be sufficient for the Staphylococcus aureus infection that she has at this point. With that being said she may need IV antibiotics to more appropriately treat this. We did have [name] discussion today about hyperbaric option therapy. 02/28/19 on evaluation today patient actually appears to be doing much worse in regard to  the wound on her left great toe as compared to even my previous evaluation last week. Unfortunately this seems to be training in [name] pretty poor direction. Her toe was actually now starting to angle laterally and I can actually see the entire joint area of the proximal portion of the digit where is the distal portion of the digit again is no longer even in contact with the joint line. Unfortunately there's [name] lot more necrotic tissue around the edge and the toe appears to be showing signs of becoming gangrenous in my pinion. I'm very concerned about were things stand at this point. She did see infectious disease and they are planning to send in [name] prescription for Sivextro for her and apparently this has been approved. With that being said I don't think she should avoid taking this but at the same time I'm not sure that it's gonna be sufficient to save her toe at this point. She tells me that she still having to care for grandparents which I think is putting quite [name] bit of strain on her foot and specifically the total area and has caused this to break down even to [name] greater degree than would've otherwise been expected. 03/05/19 on evaluation today patient actually appears to be doing quite well in regard to her toe all things considering. She still has bone exposed but there appears to be much less your thing on overall the appearance of the wound and the toe itself is dramatically improved. She still does have some issues currently obviously with infection she did see vascular as well and there concerned that her blood flow to the toad. For that reason they are setting up for an angiogram next week. 03/14/19 on evaluation today patient appears to be doing very poor in regard to her toe and specifically in regard to the ulceration and the fact that she's starting to notice the toe was leaning even more towards the lateral aspect and the complete joint is visible on the proximal aspect of the joint.  Nonetheless she's also noted [name] significant odor and the tip of the toe is turning more dark and necrotic appearing. Overall I think she is getting worse not better as far as this is concerned. For that reason I am recommending at this point that she likely needs to be seen for likely amputation. READMISSION 03/19/2021 This is [name] patient that we cared for in this clinic for [name] prolonged period of time in 2019 and 2020 with [name] left foot and left first toe wound. I believe she ultimately became infected and underwent [name] left  first toe amputation. 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 Excell Seltzer. In December 2021 she had an ulcer on her right great toe as well as the fourth and fifth toes. 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 She also had an angiogram at that time and underwent angioplasty of the right anterior tibial artery. In any case she claims that the wound on the right foot is closed I did not look at this today which was probably an oversight although I think that should be done next week. 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 According to Dr. Bernette Redbird last review that she was out of the area being cared for by another physician but recently came back to his attention. 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 [name] culture of this area showed E. coli apparently before she came back to see Dr. Excell Seltzer she was supposed to be receiving antibiotics but she did not really take them. Nor is she offloading this area at all. Finally her last hemoglobin A1c listed in epic was in March 2022 at 14.1 she says things are [name] lot better since then although I am not sure. 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 She had this wound at the time. During this admission she had E. coli sepsis [name] TEE was  negative. 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 She is still [name] heavy cigarette smoker. She has [name] history of bipolar disorder seizure disorder ABI in our clinic was 1.05 6/1; left midfoot in the setting of [name] TMA done previously. Round circular wound with [name] "knuckle" of protruding tissue. The problem is that the knuckle was not attached to any of the surrounding granulation and this probed proximally widely I removed [name] large portion of this tissue. This wound goes with considerable undermining laterally. I do not feel any bone there was no purulence but this is [name] deep wound. 6/8; in spite of the debridement I did last week. She arrives with [name] wound looking exactly the same. [name] protruding "knuckle" of tissue nonadherent to most of the surrounding tissue. There is considerable depth around this from 6-12 o'clock at 2.7 cm and undermining of 1 cm. This does not look overtly infected and the x- ray I did last week was negative for any osseous abnormalities. We have been using silver collagen 6/15; deep tissue culture I did last week showed moderate staph aureus and moderate Pseudomonas. This will definitely require prolonged antibiotic therapy. The pathology on the protuberant area was negative for malignancy fungus etc. 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 We have been using silver collagen. I am going to be prescribing Levaquin for 2 weeks. Her CT scan of the foot is down for 7/5 6/22; CT scan of the foot on 7 5. She says she has hardware in the left leg from her previous fracture. She is on the Levaquin for the deep tissue culture I did that showed methicillin sensitive staph aureus  and Pseudomonas. I gave her [name] 2-week supply and she will have another week.  She arrives in clinic today with the same protuberant tissue however this is nonadherent to the tissue surrounding it. I am really at [name] loss to explain this unless there is underlying deep tissue infection 6/29; patient presents for 1 week follow-up. She has been using collagen to the wound bed. She reports taking her antibiotics as prescribed.She has no complaints or issues today. She denies signs of infection. 7/6; patient presents for one week followup. She has been using collagen to the wound bed. She states she is taking Levaquin however at times she is not able to keep it down. She denies signs of infection. 7/13; patient presents for 1 week follow-up. She has been using silver alginate to the wound bed. She still has nausea when taking her antibiotics. She denies signs of infection. 7/20; patient presents for 1 week follow-up. She has been using silver alginate with gentamicin cream to the wound bed. She denies any issues and has no complaints today. She denies signs of infection. 7/27; patient presents for 1 week follow-up. She continues to use silver alginate with gentamicin cream to the wound bed. She reports starting her antibiotics. She has no issues or complaints. Overall she reports stability to the wound. 8/3; patient presents for 1 week follow-up. She has been using silver alginate with gentamicin cream to the wound bed. She reports completing all antibiotics. She has no issues or complaints today. She denies signs of infection. 8/17; patient presents for 2-week follow-up. He is to use silver alginate to the wound bed. She has no issues or complaints today. She denies signs of infection. She reports her pain has improved in her foot since last clinic visit 8/24; patient presents for 1 week follow-up. She continues to use silver alginate to the wound bed. She has no issues or complaints. She denies signs  of infection. Pain is stable. 9/7; patient presents for follow-up. She missed her last week appointment due to feeling ill. She continues to use silver alginate. She has [name] new wound to the right lower extremity that is covered in eschar. She states It occurred over the past week and has no idea how it started. She currently denies signs of infection. 9/14; patient presents for follow-up. T the left foot wound she has been using gentamicin cream and silver alginate. T the right lower extremity wound she has o o been keeping this covered and has not obtain Santyl. 9/21; patient presents for follow-up. She reports using gentamicin cream and silver alginate to the left foot and Santyl to the right lower extremity wound. She has no issues or complaints today. She denies signs of infection. 9/28; patient presents for follow-up. She reports [name] new wound to her right heel. She states this occurred [name] few days ago and is progressively gotten worse. She [name], [name] ([id number]) [id number] Page 7 of 23 has been trying to clean the area with [name] Q-tip and Santyl. She reports stability in the other 2 wounds. She has been using gentamicin cream and silver alginate to the left foot and Santyl to the right lower extremity wound. 10/12; patient presents for follow-up. She reports improvement to the wound beds. She is seeing vein and vascular to discuss the potential of [name] left BKA. She states they are going to do an arteriogram. She continues to use silver alginate with dressing changes to her wounds. 11/2; patient presents for follow-up. She states she has not been doing dressing changes to the wound beds. She states she is not able  to offload the areas. She reports chronic pain to her left foot wound. 11/9; patient presents for follow-up. She came in with only socks on. She states she forgot to put on shoes. It is unclear if she is doing any dressing changes. She currently denies  systemic signs of infection. 11/16; patient presents for follow-up. She came again only with socks on. She states she does not wear shoes ever. It is unclear if she does dressing changes. She currently denies systemic signs of infection. 11/23; patient presents for follow-up. She wore her shoes today. It still unclear exactly what dressing she is using for each wound but she did states she obtained Dakin's solution and has been using this to the left foot wound. She currently denies signs of infection. 11/30; patient presents for follow-up. She has no issues or complaints today. She currently denies signs of infection. 12/7; patient presents for follow-up. She has no issues or complaints today. She has been using Hydrofera Blue to the right heel wound and Dakin solution to the left foot wound. Her right anterior leg wound is healed. She currently denies signs of infection. 12/14; patient presents for follow-up. She has been using Hydrofera Blue to the right heel and Dakin's to the left foot wounds. She has no issues or complaints today. She denies signs of infection. 12/21; patient presents for follow-up. She reports using Hydrofera Blue to the right heel and Dakin's to the left foot wound. She denies signs of infection. 12/28; patient presents for follow-up. She continues to use Dakin's to the left foot wound and Hydrofera Blue to the right heel wound. She denies signs of infection. 1/4; patient presents for follow-up. She has no issues or complaints today. She denies signs of infection. 1/11; patient presents for follow-up. It is unclear if she has been dressing these wounds over the past week. She currently denies signs of infection. 1/18; patient presents for follow-up. She states she has been using Dakin's wet-to-dry dressings to the left foot. She has been using Hydrofera Blue to the right foot foot wound. She states that the anterior right leg wound has reopened and draining serous fluid. She  denies signs of infection. 1/25; patient presents for follow-up. She has no issues or complaints today. 2/1; patient presents for follow-up. She has no issues or complaints today. She denies signs of infection. 2/8; patient presents for follow-up. She has lost her surgical shoes. She did not have [name] dressing to the right heel wound. She currently denies signs of infection. 2/15; patient presents for follow-up. She reports more pain to the right heel today. She denies purulent drainage Or fever/chills 2/22; patient presents for follow-up. She reports taking clindamycin over the past week. She states that she continues to have pain to her right heel. She reports purulent drainage. Readmission 03/16/2022 Ms. Maurice Haley is [name] 47 year old female with [name] past medical history of type 2 diabetes, osteomyelitis to her feet, chronic systolic heart failure and bipolar disorder that presents to the clinic for bilateral feet wounds and right lower extremity wound. She was last seen in our clinic on 12/15/2021. At that time she had purulent drainage coming out of her right plantar foot and I recommended she go to the ED. She states she went to [facility] and has been there for the past 3 months. I cannot see the records. She states she had OR debridement and was on several weeks of IV antibiotics while inpatient. Since discharge she has not been taking care  of the wound beds. She had nothing on her feet other than socks today. She currently denies signs of infection. 5/31; patient presents for follow-up. She has been using Dakin's wet-to-dry dressings to the wound beds on her feet bilaterally and antibiotic ointment to the right anterior leg wound. She had [name] wound culture done at last clinic visit that showed moderate Pseudomonas aeruginosa sensitive to ciprofloxacin. She currently denies systemic signs of infection. 6/14; patient presents for follow-up. She received Keystone 5 days ago and has been  using this on the wound beds. She states that last week she had to go to the hospital because she had increased warmth and erythema to the right foot. She was started on 2 oral antibiotics. She states she has been taking these. She currently denies systemic signs of infection. She has no issues or complaints today. 6/21; patient presents for follow-up. She states she has been using Keystone antibiotics to the wound beds. She has no issues or complaints today. She denies signs of infection. 6/28; patient presents for follow-up. She has been using Keystone antibiotics to the wound beds. She has no issues or complaints today. 7/12; patient presents for follow-up. Has been using Keystone antibiotics to the wound beds with calcium alginate. She has no issues or complaints today. She never followed up with her orthopedic surgeon who did the OR debridement to the right foot. We discussed the total contact cast for the left foot and patient would like to do this next week. 7/19; patient presents for follow-up. She has been using Keystone antibiotics with calcium alginate to the wound beds. She has no issues or complaints today. Patient is in agreement to do the total contact cast of the left foot today. She knows to return later this week for the obligatory cast change. 05-13-2022 upon evaluation today patient's wound which she has the cast of the left leg actually appears to be doing significantly better. Fortunately I do not see any signs of active infection locally or systemically which is great news and overall I am extremely pleased with where we stand currently. 7/26; patient presents for follow-up. She has [name] cast in place for the past week. She states it irritated her shin. Other than that she tolerated the cast well. She states she would like [name] break for 1 week from the cast. We have been using Keystone antibiotic and Aquacel to both wound beds. She denies signs of infection. 8/2; patient presents for  follow-up. She has been using Keystone and Aquacel to the wound beds. She denies any issues and has no complaints. She is agreeable to have the cast placed today for the left leg. 06-03-2022 upon evaluation today patient appears to be doing well with regard to her wound she saw some signs of improvement which is great news. Fortunately I do not see any evidence of active infection locally or systemically at this time which is great news. No fevers, chills, nausea, vomiting, or diarrhea. 8/16; patient presents for follow-up. She has no issues or complaints today. We have been using Keystone and Aquacel to the wound beds. The left lower Rogstad, Moneka [name] ([id number]) [id number] Page 8 of 23 extremity is in [name] total contact cast. She is tolerated this well. 8/23; patient presents for follow-up. She has had the total contact cast on the left leg for the past week. Unfortunately this has rubbed and broken down the skin to the medial foot. She currently denies signs of infection. She has been using Keystone antibiotic  to the right plantar foot wound. 8/30; patient presents for follow-up. We have held off on the total contact cast for the left leg for the past week. Her wound on the left foot has improved and the previous surrounding breakdown of skin has epithelialized. She has been using Keystone antibiotic to both wound beds. She has no issues or complaints today. She denies signs of infection. 9/6; patient presents for follow-up. She has ordered her's Keystone antibiotic and this is arriving this week. She has been doing Dakin's wet-to-dry dressings to the wound beds. She denies signs of infection. She is agreeable to the total contact cast today. 9/13; patient presents for follow-up. She states that the cast caused her left leg shin to be sore. She would like to take [name] break from the cast this week. She has been using Keystone antibiotic to the right plantar foot wound. She  denies signs of infection. 9/20; patient presents for follow-up. She has been using Keystone antibiotics to the wound beds with calcium alginate to the right foot wound and Hydrofera Blue to the left foot wound. She is agreeable to having the cast placed today. She has been approved for Apligraf and we will order this for next clinic visit. 9/27; patient presents for follow-up. We have been using Keystone antibiotic with Hydrofera Blue to the left foot wound under [name] total contact cast. T the right o foot wound she has been using Keystone antibiotic and calcium alginate. She declines [name] total contact cast today. Apligraf is available for placement and she would like to proceed with this. 07-28-2022 upon evaluation today patient appears to be doing well currently in regard to her wound. She is actually showing signs of significant improvement which is great news. Fortunately I do not see any evidence of active infection locally nor systemically at this time. She has been seeing Dr. Mikey Bussing and to be honest has been doing very well with the cast. Subsequently she comes in today with [name] cast on and we did reapply that today as well. She did not really want to she try to talk me out of that but I explained that if she wanted to heal this is really the right way to go. Patient voiced understanding. In regard to her right foot this is actually [name] lot better compared to the last time I saw her which is also great news. 10/11; patient presents for follow-up. Apligraf and the total contact cast was placed to the left leg at last clinic visit. She states that her right foot wound had burning pain to it with the placement of Apligraf to this area. She has been doing [place] over this area. She denies signs of infection including increased warmth, erythema or purulent drainage. 11/1; 3-week follow-up. The patient fortunately did not have [name] total contact cast or an Apligraf and on the left foot. She has been using  Keystone ABD pads and kerlix and her own running shoes She arrives in clinic today with thick callus and [name] very poor surface on the left foot on the right nonviable skin subcutaneous tissue and [name] deep probing hole. 11/15; patient missed her last clinic appointment. She states she has not been dressing the wound beds for the past 2 weeks. She states that at she had [name] new roommate but is now going back to live with her mother. Apparently its been [name] distracting 2 weeks. Patient currently denies signs of infection. 11/22; patient presents for follow-up. She states she has been using  Keystone antibiotic and Dakin's wet-to-dry dressings to the wound beds. She is agreeable for cast placement today. We had ordered Apligraf however this has not been received by our facility. 11/29; Patient had [name] total contact cast placed at last clinic visit and she tolerated this well. We were using silver alginate under the cast. Patient's been using Keystone antibiotic with Aquacel to the right plantar foot wound. She has no issues or complaints today. Apligraf is available for placement today. Patient would like to proceed with this. 12/6; patient presents for follow-up. She had Apligraf placed in standard fashion last clinic visit under the total contact cast to the left lower extremity. She has been using Keystone antibiotic and Aquacel to the right plantar foot wound. She has no issues or complaints today. 12/13; patient presents for follow-up. She has finished 5 Apligraf placements. Was told she would not qualify for more. We have been doing [name] total contact cast to the left lower extremity. She has been using Keystone antibiotic and Aquacel to the right plantar foot wound. She has no issues or complaints today. 12/20; patient presents for follow-up. We have been using Hydrofera Blue with Keystone antibiotic under [name] total contact cast of the left lower extremity. She reports using Keystone antibiotic and silver alginate  to the right heel wound. She has no issues or complaints today. 12/27; patient presents with [name] healthy wound on the left midfoot. We have Apligraf to apply that to that more also using [name] total contact cast. On the right we are using Keystone and silver alginate. She is offloading the right heel with [name] surgical shoealthough by her admission she is on her feet quite [name] bit 1/3; patient presents for follow-up. Apligraf was placed to the wound beds last clinic visit. She was placed in [name] total contact cast to the left lower extremity. She declines [name] total contact cast today. She states that her mother is in the hospital and she cannot adequately get around with the cast on. 1/10; patient presents for follow-up. She declined the total contact cast at last clinic visit. Both wounds have declined in appearance. She states that she has been on her feet and not offloading the wound beds. She currently denies signs of infection. 1/17; patient presents for follow-up. She had the total contact cast along with Apligraf placed last week to the left lower extremity. She tolerated this well. She has been using Aquacel Ag and Keystone antibiotic to the right heel wound. She currently denies signs of infection. She has no issues or complaints today. 1/24; patient presents for follow-up. We have been using Apligraf to the left foot wound along with [name] total contact cast. She has done well with this. T the right o heel wound she has been using Aquacel Ag and Keystone antibiotic ointment. She has no issues or complaints today. She denies signs of infection. 1/31; patient presents for follow-up. We have been using Apligraf to the left foot wound along with the total contact cast. She continues to do well with this. To the right heel we have been using Aquacel Ag and Keystone antibiotic ointment. She has no issues or complaints today. 12-01-2022 upon evaluation patient is seen today on my schedule due to the fact that she  unfortunately was in the hospital yesterday. Her cast needed to come off the only reason she is out of the hospital is due to the fact that they would not take it off at the hospital which is somewhat bewildered reading  to me to be perfectly honest. I am not certain why this was but either way she was released and then was placed on my schedule today in order to get this off and reapply the total contact casting as appropriate. I do not have an Apligraf for her today it was applied last week and today's actually expired yesterday as there was some scheduling conflicts with her being in the hospital. Nonetheless we do not have that for reapplication today but the good news that she is not draining too much and the Apligraf can go for up to 2 weeks so I am going to go ahead and reapply the total contact casting but we are going to leave the Apligraf in place. 2/14; patient presents for follow-up. T the left leg she has had the total contact cast and Apligraf for the past week. She has had no issues with this. T the o o right heel she has been using antibiotic ointment and Aquacel Ag. 2/21; patient presents for follow-up. We have been using Apligraf and [name] total contact cast to the left lower extremity. She is tolerated this well. Unfortunately she is not approved for any more Apligraf per insurance. She has been using antibiotic ointment and Aquacel Ag to the right foot. She has no issues or complaints today. 2/28; patient presents for follow-up. We have been using Hydrofera Blue and antibiotic ointment under the total contact cast to the left lower extremity. He has been using Aquacel Ag with antibiotic ointment to the right plantar foot. She has no issues or complaints today. 3/6; patient presents for follow-up. She did not obtain her gentamicin ointment. She has been using Aquacel Ag to the right plantar foot wound. We have been [name], [name] ([id number]) [id number] Page  9 of 23 using Hydrofera Blue with antibiotic ointment under the total contact cast to the left lower extremity. She has no issues or complaints today. 3/12; patient presents for follow-up. She has been using gentamicin ointment and Aquacel Ag to the right plantar foot wound. We have been using Hydrofera Blue with antibiotic ointment under the total contact cast on the left lower extremity foot wound. She has no issues or complaints today. 3/20; patient presents for follow-up. She has been using gentamicin ointment and Aquacel Ag to the right plantar foot wound. We have been using Hydrofera Blue with antibiotic ointment under the total contact cast to the left lower extremity wound. She has no issues or complaints today. 3/27; patient presents for follow-up. We have been using antibiotic ointment and Aquacel Ag to the right plantar foot wound. We have been using Hydrofera Blue with antibiotic ointment under the total contact cast to the left lower extremity. Both wounds are smaller. 01-24-2023 upon evaluation today patient actually appears to be doing better compared to last time I saw her. It has been several weeks since I last saw her but nonetheless I think the total contact casting is doing [name] good job. Fortunately I do not see any evidence of infection or worsening overall which is great news and in general I do believe that she is moving in the appropriate direction. It has been [name] very long road but nonetheless I feel like she is nearing the end at least in regard to the left foot and even the right foot looks much better to be perfectly honest. 01-31-2023 upon evaluation today patient appears to be doing well currently in regard to her wounds. She has been require some sharp debridement which  I think is probably bone both sides to remove [name] lot of callus that has built up at this point. I discussed that with her today. Also think total contact cast is doing well for the left foot Emina continue that as  well. 4/17; patient presents for follow-up. We have been doing Hydrofera Blue with antibiotic ointment under the total contact cast to the left lower extremity and Aquacel Ag with antibiotic ointment to the right foot wound. She has been using her offloading heel shoe here. 4/24; patient presents for follow-up. We have been doing Hydrofera Blue with antibiotic ointment under the total contact cast to the left lower extremity and Aquacel Ag with antibiotic ointment to the right foot wound. She has been using her offloading heel shoe here. Wounds are smaller with slough accumulation. 5/1; patient presents for follow-up. We have been doing Hydrofera Blue with antibiotic ointment under the total contact cast to the left lower extremity and Aquacel Ag with antibiotic ointment to the right foot wound. Left foot wound is stable and right foot wound appears smaller. 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 On the left she is using Hydrofera Blue topical antibiotic under [name] total contact cast, on the right plantar heel gentamicin Aquacel Ag. According to our intake nurse both are making slow but steady improvements. 5/15; patient presents for follow-up. We have been doing Hydrofera Blue with antibiotic ointment under the total contact cast on the left and antibiotic ointment with Aquacel Ag on the right. Unfortunately the left foot wound has declined in size and appearance. We also do not have the total contact cast available in office today. Patient denies signs of infection. 5/22; patient presents for follow-up. We have been doing Hydrofera Blue and antibiotic ointment to the left foot and Aquacel Ag with antibiotic ointment to the right foot. We took [name] break from the cast since we did not have it placed at last week. Wounds look stable if not slightly improved. 03-27-2023 patient presents today she has had this For Most 2 Weeks Because She Was in the Hospital and They Did Not T It off. Again That Is Really  Not ake Something That She Could Control but Nonetheless We Are Seeing Her at This Point Today for Reevaluation. We Do Want to Go and See about Reapply the Cast. Again We Had All Set Everything up to Go Ahead and Do This When We Went to Get the Cast Only to Realize That Not Had Come in and We Were out at the Moment. With That Being York Spaniel We Will Get Have T Get the Exam before We Can Reapply the T Contact Casting Going Forward. o otal 6/12; patient presents for follow-up. She has been using Hydrofera Blue to the left foot wound and Aquacel Ag to the right foot wound. No cast was available last week placed on the patient so she has been offloading with her surgical shoe. She has no issues or complaints today. Electronic Signature(s) Signed: 04/05/2023 4:14:13 PM By: Geralyn Corwin DO Entered By: Geralyn Corwin on 04/05/2023 13:05:45 -------------------------------------------------------------------------------- Physical Exam Details Patient Name: Date of Service: DEENNA, BRUNEAU 04/05/2023 11:00 [name]: [id number] Patient Account Number: [id number] Date of Birth/Sex: Treating RN: Jul 03, 1976 (47 y.o. Skip Mayer Primary Care Provider: [name] Other Clinician: [name] Referring Provider: Treating Provider/Extender: Chapman Moss in Treatment: 55 Constitutional . Cardiovascular . Psychiatric . MAKINZI, KEMMERLING [name] ([id number]) [id number] Page 10 of 23 Notes Right foot: T  the plantar heel there is an incision site with increased depth, No probing to bone. T the opening there is Slough, granulation tissue and callus. o o Left foot: T the medial aspect there is an open wound with granulation tissue, Callus and slough build up. No overt signs of infection to any of the wound o beds. Electronic Signature(s) Signed: 04/05/2023 4:14:13 PM By: Geralyn Corwin DO Entered By: Geralyn Corwin on 04/05/2023  13:07:14 -------------------------------------------------------------------------------- Physician Orders Details Patient Name: Date of Service: Heather Jefferson [name]. 04/05/2023 11:00 [name]: [id number] Patient Account Number: [id number] Date of Birth/Sex: Treating RN: [birth year] (47 y.o. Skip Mayer Primary Care Provider: [name] Other Clinician: [name] Referring Provider: Treating Provider/Extender: Chapman Moss in Treatment: 37 Verbal / Phone Orders: Yes Clinician: Huel Coventry Read Back and Verified: Yes Diagnosis Coding Follow-up Appointments Return Appointment in 1 week. Nurse Visit as needed Bathing/ Shower/ Hygiene May shower with wound dressing protected with water repellent cover or cast protector. No tub bath. Anesthetic (Use 'Patient Medications' Section for Anesthetic Order Entry) Lidocaine applied to wound bed Edema Control - Lymphedema / Segmental Compressive Device / Other Elevate, Exercise Daily and [name] void Standing for Long Periods of Time. Elevate legs to the level of the heart and pump ankles as often as possible Elevate leg(s) parallel to the floor when sitting. Off-Loading Total Contact Cast to Left Lower Extremity - tcc applied left leg Open toe surgical shoe - Heel off loader - Right foot- Other: - keep pressure off of feet. Additional Orders / Instructions Follow Nutritious Diet and Increase Protein Intake Other: - Stay off of foot, wheelchair only Wound Treatment Wound #11 - Foot Wound Laterality: Plantar, Right Cleanser: Wound Cleanser 3 x Per Week/30 Days Discharge Instructions: Wash your hands with soap and water. Remove old dressing, discard into plastic bag and place into trash. Cleanse the wound with Wound Cleanser prior to applying [name] clean dressing using gauze sponges, not tissues or cotton balls. Do not scrub or use excessive force. Pat dry using gauze sponges, not tissue or cotton  balls. Prim Dressing: Aquacel Extra Hydrofiber Dressing, 2x2 (in/in) ary 3 x Per Week/30 Days Secondary Dressing: ABD Pad 5x9 (in/in) (Generic) 3 x Per Week/30 Days Discharge Instructions: Cover with ABD pad Secured With: Medipore T - 5M Medipore H Soft Cloth Surgical T ape ape, 2x2 (in/yd) 3 x Per Week/30 Days Secured With: [employer] or Non-Sterile 6-ply 4.5x4 (yd/yd) 3 x Per Week/30 Days Discharge Instructions: Apply Kerlix as directed RUSSCHELLE, SLEET [name] ([id number]) [id number] Page 11 of 23 Wound #12 - Foot Wound Laterality: Plantar, Left, Medial Cleanser: Wound Cleanser 1 x Per Week/30 Days Discharge Instructions: Wash your hands with soap and water. Remove old dressing, discard into plastic bag and place into trash. Cleanse the wound with Wound Cleanser prior to applying [name] clean dressing using gauze sponges, not tissues or cotton balls. Do not scrub or use excessive force. Pat dry using gauze sponges, not tissue or cotton balls. Topical: Gentamicin 1 x Per Week/30 Days Discharge Instructions: Apply as directed by provider. Prim Dressing: Hydrofera Blue Ready Transfer Foam, 2.5x2.5 (in/in) 1 x Per Week/30 Days ary Discharge Instructions: Apply Hydrofera Blue Ready to wound bed as directed Secondary Dressing: heel cup 1 x Per Week/30 Days Discharge Instructions: x2 for heel and front of foot Secured With: Medipore T - 5M Medipore H Soft Cloth Surgical T ape ape, 2x2 (in/yd) 1 x Per Week/30 Days Secured  With: Kerlix Roll Sterile or Non-Sterile 6-ply 4.5x4 (yd/yd) 1 x Per Week/30 Days Discharge Instructions: Apply Kerlix as directed Electronic Signature(s) Signed: 04/05/2023 4:14:13 PM By: Geralyn Corwin DO Entered By: Geralyn Corwin on 04/05/2023 13:12:45 -------------------------------------------------------------------------------- Problem List Details Patient Name: Date of Service: Heather Jefferson [name]. 04/05/2023 11:00 [name] Medical Record  Number: [id number] Patient Account Number: [id number] Date of Birth/Sex: Treating RN: 11-01-1975 (47 y.o. 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 03/16/2022 No Yes E11.621 Type 2 diabetes mellitus with foot ulcer 03/16/2022 No Yes E11.42 Type 2 diabetes mellitus with diabetic polyneuropathy 03/16/2022 No Yes L97.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 03/16/2022 No Yes of skin [name], Myrtha [name] ([id number]) [id number] Page 12 of 23 Z79.4 Long term (current) use of insulin 02/22/2023 No Yes Inactive Problems Resolved Problems Electronic Signature(s) Signed: 04/05/2023 4:14:13 PM By: Geralyn Corwin DO Entered By: Geralyn Corwin on 04/05/2023 12:55:27 -------------------------------------------------------------------------------- Progress Note Details Patient Name: Date of Service: Heather Jefferson [name]. 04/05/2023 11:00 [name]: [id number] Patient Account Number: [id number] Date of Birth/Sex: Treating RN: 15-Dec-1975 (47 y.o. Skip Mayer Primary Care Provider: [name] Other Clinician: [name] Referring Provider: Treating Provider/Extender: Chapman Moss in Treatment: 73 Subjective Chief Complaint Information obtained from Patient 03/19/2021; patient referred by Dr. Excell Seltzer who has been looking after her left foot for quite [name] period of time for review of [name] nonhealing area in the left midfoot 03/12/2022; bilateral feet wounds and right lower extremity  wound. History of Present Illness (HPI) 01/18/18-She is here for initial evaluation of the left great toe ulcer. She is [name] poor historian in regards to timeframe in detail. She states approximately 4 weeks ago she lacerated her toe on something in the house. She followed up with her primary care who placed her on Bactrim and ultimately [name] second dose of Bactrim prior to coming to wound clinic. She states she has been treating the toe with peroxide, Betadine and [name] Band-Aid. She did not check her blood sugar this morning but checked it yesterday morning it was 327; she is unaware of [name] recent A1c and there are no current records. She saw Dr. she would've orthopedics last week for an old injury to the left ankle, she states he did not see her toe, nor did she bring it to his attention. She smokes approximately 1 pack cigarettes [name] day. Her social situation is concerning, she arrives this morning with her mother who appears extremely intoxicated/under the influence; her mother was asked to leave the room and be monitored by the patient's grandmother. The patient's aunt then accompanied the patient and the room throughout the rest of the appointment. We had [name] lengthy discussion regarding the deleterious effects of uncontrolled hyperglycemia and smoking as it relates to wound healing and overall health. She was strongly encouraged to decrease her smoking and get her diabetes under better control. She states she is currently on [name] diet and has cut down her [facility] consumption. The left toe is erythematous, macerated and slightly edematous with malodor present. The edema in her left foot is below her  baseline, there is no erythema streaking. We will treat her with Santyl, doxycycline; we have ordered and xray, culture and provided [name] Peg assist surgical shoe and cultured the wound. 01/25/18-She is here in follow-up evaluation for [name] left great toe ulcer and presents with an abscess to her suprapubic area. She  states her blood sugars remain elevated, feeling "sick" and if levels are below 250, but she is trying. She has made no attempt to decrease her smoking stating that we "can't take away her food in her cigarettes". She has been compliant with offloading using the PEG assist you. She is using Santyl daily. the culture obtained last week grew staph aureus and Enterococcus faecalis; continues on the doxycycline and Augmentin was added on Monday. The suprapubic area has erythema, no femoral variation, purple discoloration, minimal induration, was accessed with [name] cotton tip applicator with sanguinopurulent drainage, this was cultured, I suspect the current antibiotic treatment will cover and we will not add anything to her current treatment plan. She was advised to go to urgent care or ER with any change in redness, induration or fever. 02/01/18-She is here in follow-up evaluation for left great toe ulcers and [name] new abdominal abscess from last week. She was able to use packing until earlier this week, where she "forgot it was there". She states she was feeling ill with GI symptoms last week and was not taking her antibiotic. She states her glucose levels have been predominantly less than 200, with occasional levels between 200-250. She thinks this was contributing to her GI symptoms as they have resolved without intervention. There continues to be significant laceration to left toe, otherwise it clinically looks stable/improved. There is now less superficial opening to the lateral aspect of the great toe that was residual blister. We will transition to [facility] to all wounds, she will continue her Augmentin. If there is no change or deterioration next week for reculture. 02/08/18-She is here in follow-up evaluation for left great toe ulcer and abdominal ulcer. There is an improvement in both wounds. She has been wrapping her left toe with coban, not by our direction, which has created an area of  discoloration to the medial aspect; she has been advised to NOT use coban secondary to her neuropathy. She states her glucose levels have been high over this last week ranging from 200-350, she continues to smoke. She admits to being less compliant with her offloading shoe. We will continue with same treatment plan and she will follow-up next week. 02/15/18-She is here in follow-up evaluation for left great toe ulcer and abdominal ulcer. The abdominal ulcer is epithelialized. The left great toe ulcer is improved and all injury from last week using the Coban wrap is resolved, the lateral ulcer is healed. She admits to noncompliance with wearing offloading shoe and admits to glucose levels being greater than 300 most of the week. She continues to smoke and expresses no desire to quit. There is one area medially that probes deeper than it has historically, erythema to the toe and dorsal foot has consistently waxed and waned. There is no overt signs of cellulitis or infection but we will culture the wound for any occult infection given the new area of depth and erythema. We will hold off on sensitivities for initiation of antibiotic therapy. 02/22/18-She is here in follow up evaluation for left great toe ulcer. There is overall significant improvement in both wound appearance, erythema and edema with changes made last week. She was not initiated on  antibiotic therapy. Culture obtained last week showed oxacillin sensitive staph aureus, sensitive to MIU, SANDEFER [name] ([id number]) [id number] Page 13 of 23 clindamycin. Clindamycin has been called into the pharmacy but she has been instructed to hold off on initiation secondary to overall clinical improvement and her history of antibiotic intolerance. She has been instructed to contact the clinic with any noted changes/deterioration and the wound, erythema, edema and/or pain. She will follow-up next week. She continues to smoke and her  glucose levels remain elevated >250; she admits to compliance with offloading shoe 03/01/18 on evaluation today patient appears to be doing fairly well in regard to her left first toe ulcer. She has been tolerating the dressing changes with the [facility] Dressing without complication and overall this has definitely showed signs of improvement according to records as well is what the patient tells me today. I'm very pleased in that regard. She is having no pain today 03/08/18 She is here for follow up evaluation of [name] left great toe ulcer. She remains non-compliant with glucose control and smoking cessation; glucose levels consistently >200. She states that she got new shoe inserts/peg assist. She admits to compliance with offloading. Since my last evaluation there is significant improvement. We will switch to prisma at this time and she will follow up next week. She is noted to be tachycardic at this appointment, heart rate 120s; she has [name] history of heart rate 70-130 according to our records. She admits to extreme agitation r/t personal issues; she was advised to monitor her heartrate and contact her physician if it does not return to [name] more normal range (<100). She takes cardizem twice daily. 03/15/18-She is here in follow-up evaluation for left great toe ulcer. She remains noncompliant with glucose control and smoking cessation. She admits to compliance with wearing offloading shoe. The ulcer is improved/stable and we will continue with the same treatment plan and she will follow-up next week 03/22/18-She is here for evaluation for left great toe ulcer. There continues to be significant improvement despite recurrent hyperglycemia (over 500 yesterday) and she continues to smoke. She has been compliant with offloading and we will continue with same treatment plan and she will follow-up next week. 03/29/18-She is here for evaluation for left great toe ulcer. Despite continuing to smoke and uncontrolled  diabetes she continues to improve. She is compliant with offloading shoe. We will continue with the same treatment plan and she will follow-up next week 04/05/18- She is here in follow up evaluation for [name] left great toe ulcer; she presents with small pustule to left fifth toe (resembles ant bite). She admits to compliance with wearing offloading shoe; continues to smoke or have uncontrolled blood glucose control. There is more callus than usual with evidence of bleeding; she denies known trauma. 04/12/18-She is here for evaluation of left great toe ulcer. Despite noncompliance with glycemic control and smoking she continues to make improvement. She continues to wear offloading shoe. The pustule, that was identified last week, to the left fifth toe is resolved. She will follow-up in 2 weeks 05/03/18-she is seen in follow-up evaluation for [name] left great toe ulcer. She is compliant with offloading, otherwise noncompliant with glycemic control and smoking. She has plateaued and there is minimal improvement noted. We will transition to [facility], replaced the insert to her surgical shoe and she will follow-up in one week 05/10/18- She is here in follow up evaluation for [name] left great toe ulcer. It appears stable despite measurement change. We will  continue with same treatment plan and follow up next week. 05/24/18-She is seen in follow-up evaluation for [name] left great toe ulcer. She remains compliant with offloading, has made significant improvement in her diet, decreasing the amount of sugar/soda. She said her recent A1c was 10.9 which is lower than. She did see [name] diabetic nutritionist/educator yesterday. She continues to smoke. We will continue with the same treatment plan and she'll follow-up next week. 05/31/18- She is seen in follow-up evaluation for left great toe ulcer. She continues to remain compliant with offloading, continues to make improvement in her diet, increasing her water and decreasing the  amount of sugar/soda. She does continue to smoke with no desire to quit. We will apply Prisma to the depth and Hydrofera Blue over. We have not received insurance authorization for oasis. She will follow up next week. 06/07/18-She is seen in follow-up evaluation for left great toe ulcer. It has stalled according to today's measurements although base appears stable. She says she saw [name] diabetic educator yesterday; her average blood sugars are less than 300 which is an improvement for her. She continues to smoke and states "that's my next step" She continues with water over soda. We will order for xray, culture and reinstate ace wrap compression prior to placing apligraf for next week. She is voicing no complaints or concerns. Her dressing will change to iodoflex over the next week in preparation for apligraf. 06/14/18-She is seen in follow-up evaluation for left great toe ulcer. Plain film x-ray performed last week was negative for osteomyelitis. Wound culture obtained last week grew strep B and OSSA; she is initiated on keflex and cefdinir today; there is erythema to the toe which could be from ace wrap compression, she has [name] history of wrapping too tight and has has been encouraged to maintain ace wraps that we place today. We will hold off on application of apligraf today, will apply next week after antibiotic therapy has been initiated. She admits today that she has resumed taking [name] shower with her foot/toe submerged in water, she has been reminded to keep foot/toe out of the bath water. She will be seen in follow up next week 06/21/18-she is seen in follow-up evaluation for left great toe ulcer. She is tolerating antibiotic therapy with no GI disturbance. The wound is stable. Apligraf was applied today. She has been decreasing her smoking, only had 4 cigarettes yesterday and 1 today. She continues being more compliant in diabetic diet. She will follow-up next week for evaluation of site, if stable will  remove at 2 weeks. 06/28/18- She is here in follow up evalution. Apligraf was placed last week, she states the dressing fell off on Tuesday and she was dressing with hydrofera blue. She is healed and will be discharged from the clinic today. She has been instructed to continue with smoking cessation, continue monitoring glucose levels, offloading for an additional 4 weeks and continue with hydrofera blue for additional two weeks for any possible microscopic opening. Readmission: 08/07/18 on evaluation today patient presents for reevaluation concerning the ulcer of her right great toe. She was previously discharged on 06/28/18 healed. Nonetheless she states that this began to show signs of drainage she subsequently went to her primary care provider. Subsequently an x-ray was performed on 08/01/18 which was negative. The patient was also placed on antibiotics at that time. Fortunately they should have been effective for the infection. Nonetheless she's been experiencing some improvement but still has [name] lot of drainage coming from the  wound itself. 08/14/18 on evaluation today patient's wound actually does show signs of improvement in regard to the erythema at this point. She has completed the antibiotics. With that being said we did discuss the possibility of placing her in [name] total contact cast as of today although I think that I may want to give this just [name] little bit more time to ensure nothing recurrence as far as her infection is concerned. I do not want to put in the cast and risk infection at that time if things are not completely resolved. With that being said she is gonna require some debridement today. 08/21/18 on evaluation today patient actually appears to be doing okay in regard to her toe ulcer. She's been tolerating the dressing changes without complication. With that being said it does appear that she is ready and in fact I think it's appropriate for [place] to go ahead and initiate the total  contact cast today. Nonetheless she will require some sharp debridement to prepare the wound for application. Overall I feel like things have been progressing well but we do need to do something to get this to close more readily. 08/24/18 patient seen today for reevaluation after having had the total contact cast applied on Tuesday. She seems to have done very well the wound appears to be doing great and overall I'm pleased with the progress that she's made. There were no abnormal areas of rubbing from the cast on her lower extremity. 08/30/18 on evaluation today patient actually appears to be completely healed in regard to her plantar toe ulcer. She tells me at this point she's been having [name] lot of issues with the cast. She almost fell [name] couple of times the state shall the step of her dog [name] couple times as well. This is been [name] very frustrating process for her other nonetheless she has completely healed the wound which is excellent news. Overall there does not appear to be the evidence of infection at this time which is great news. 09/11/18 evaluation today patient presents for follow-up concerning her great toe ulcer on the left which has unfortunately reopened since I last saw her which was only [name] couple of weeks ago. Unfortunately she was not able to get in to get the shoe and potentially the AFO that's gonna be necessary due to her left foot drop. She continues with offloading shoe but this is not enough to prevent her from reopening it appears. When we last had her in the total contact cast she did well from [name] healing standpoint but unfortunately the wound reopened as soon as she came out of the cast within just [name] couple of weeks. 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 This is also part of the reason  why she falls on [name] regular basis. I just do not believe that is gonna be safe for [place] to reinitiate the total contact cast as last time we had this on she fell 3 times one week which is definitely not normal for her. 09/18/18 upon evaluation today the patient actually appears to be doing about the same in regard to her toe ulcer. She did not contact [employer] as I asked her to even though I had given her the prescription. In fact she actually states that  she has no idea where the prescription is. She did apparently call [facility] and they told her that all she needed to do was bring the prescription in order to be able to be seen and work on getting the AFO for her left foot. With all that being said she still does not have an appointment and I'm not sure were things stand that regard. I will give her [name] new prescription today in order to contact them to get this set up. 09/25/18 on evaluation today patient actually appears to be doing about the same in regard to her toes ulcer. She does have [name] small areas which seems to have [name] lot of callous buildup around the edge of the wound which is going to need sharp debridement today. She still is waiting to be scheduled for evaluation with SHERIDEN, BLAKEMORE [name] ([id number]) [id number] Page 14 of [address] [facility] for possibility of an AFO. She states there supposed to call her tomorrow to get this set up. Unfortunately it does appear that her foot specifically the toe area is showing signs of erythema. There does not appear to be any systemic infection which is in these good news. 10/02/18 on evaluation today patient actually appears to be doing about the same in regard to her toe ulcer. This really has not done too well although it's not significantly larger it's also not significantly smaller. She has been tolerating the dressing changes without complication. She actually has her appointment with [employer] and [facility] tomorrow to hopefully be  measured for obtaining and AFO splint. I think this would be helpful preventing this from reoccurring. We had contemplated starting the cast this week although to be honest I am reluctant to do that as she's been having nausea, vomiting, and seizure activity over the past three days. She has [name] history of seizures and have been told is nothing that can be done for these. With that being said I do believe that along with the seizures have the nausea vomiting which upon further questioning doesn't seem to be the normal for her and makes me concerned for the possibility of infection or something else going on. I discussed this with the patient and her mother during the office visit today. I do not feel the wound is effective but maybe something else. The responses this was "this just happens to her at times and we don't know why". They did not seem to be interested in going to the hospital to have this checked out further. 10/09/18 on evaluation today patient presents for follow-up concerning her ongoing toe ulcer. She has been tolerating the dressing changes without complication. Fortunately there does not appear to be any evidence of infection which is great news however I do think that the patient would benefit from going ahead for with the total contact cast. She's actually in [name] wheelchair today she tells me that she will use her walker if we initiate the cast. I was very specific about the fact that if we were gonna do the cast I wanted to make sure that she was using the walker in order to prevent any falls. She tells me she does not have stairs that she has to traverse on [name] regular basis at her home. She has not had any seizures since last week again that something that happens to her often she tells me she did talk to [employer] and they said that it may take up to three weeks to get the brace approved for her. Hopefully that  will not take that long but nonetheless in the meantime I do think the cast  could be of benefit. 10/12/18 on evaluation today patient appears to be doing rather well in regard to her toe ulcer. It's just been [name] few days and already this is significantly improved both as far as overall appearance and size. Fortunately there's no sign of infection. She is here for her first obligatory cast change. 10/19/18 Seen today for follow up and management of left great toe ulcer. Wound continues to show improvement. Noted small open area with seroussang drainage with palpation. Denies any increased pain or recent fevers during visit. She will continue calcium alginate with offloading shoe. Denies any questions or concerns during visit. 10/26/18 on evaluation today patient appears to be doing about the same as when I last saw her in regard to her wound bed. Fortunately there does not appear to be any signs of infection. Unfortunately she continues to have [name] breakdown in regard to the toe region any time that she is not in the cast. It takes almost no time at all for this to happen. Nonetheless she still has not heard anything from the brace being made by [employer] as to when exactly this will be available to her. Fortunately there is no signs of infection at this time. 10/30/18 on evaluation today patient presents for application of the total contact cast as we just received him this morning. Fortunately we are gonna be able to apply this to her today which is great news. She continues to have no significant pain which is good news. Overall I do feel like things have been improving while she was the cast is when she doesn't have [name] cast that things get worse. She still has not really heard anything from [employer] regarding her brace. 11/02/18 upon evaluation today patient's wound already appears to be doing significantly better which is good news. Fortunately there does not appear to be any signs of infection also good news. Overall I do think the total contact cast as before is helping to heal this  area unfortunately it's just not gonna likely keep the area closed and healed without her getting her brace at least. Again the foot drop is [name] significant issue for her. 11/09/18 on evaluation today patient appears to be doing excellent in regard to her toe ulcer which in fact is completely healed. Fortunately we finally got the situation squared away with the paperwork which was needed to proceed with getting her brace approved by Medicaid. I have filled that out unfortunately that information has been sent to the orthopedic office that I worked at 2 1/2 years ago and not tired Current wound care measures. Fortunately she seems to be doing very well at this time. 11/23/18 on evaluation today patient appears to be doing More Poorly Compared to Last Time I Saw Her. At [facility] She Had Completely Healed. Currently she is continuing to have issues with reopening. She states that she just found out that the brace was approved through Medicaid now she just has to go get measured in order to have this fitted for her and then made. Subsequently she does not have an appointment for this yet that is going to complicate things we obviously cannot put her back in the cast if we do not have everything measured because they're not gonna be able to measure her foot while she is in the cast. Unfortunately the other thing that I found out today as well is that  she was in the hospital over the weekend due to having [name] heroin overdose. Obviously this is unfortunate and does have me somewhat worried as well. 11/30/18 on evaluation today patient's toe ulcer actually appears to be doing fairly well. The good news is she will be getting her brace in the shoes next week on Wednesday. Hopefully we will be able to get this to heal without having to go back in the cast however she may need the cast in order to get the wound completely heal and then go from there. Fortunately there's no signs of infection at this time. 12/07/18 on  evaluation today patient fortunately did receive her brace and she states she could tell this definitely makes her walk better. With that being said she's been having issues with her toe where she noticed yesterday there was [name] lot of tissue that was loosing off this appears to be much larger than what it was previous. She also states that her leg has been read putting much across the top of her foot just about the ankle although this seems to be receiving somewhat. The total area is still red and appears to be someone infected as best I can tell. She is previously taken Bactrim and that may be [name] good option for her today as well. We are gonna see what I wound culture shows as well and I think that this is definitely appropriate. With that being said outside of the culture I still need to initiate something in the interim and that's what I'm gonna go ahead and select Bactrim is [name] good option for her. 12/14/18 on evaluation today patient appears to be doing better in regard to her left great toe ulcer as compared to last week's evaluation. There's still some erythema although this is significantly improved which is excellent news. Overall I do believe that she is making good progress is still gonna take some time before she is where I would like her to be from the standpoint of being able to place her back into the total contact cast. Hopefully we will be where we need to be by next week. 12/21/18 on evaluation today patient actually appears to be doing poorly in regard to her toe ulcer. She's been tolerating the dressing changes without complication. Fortunately there's no signs of systemic infection although she does have [name] lot of drainage from the toe ulcer and this does seem to be causing some issues at this point. She does have erythema on the distal portion of her toe that appears to be likely cellulitis. 12/28/18 on evaluation today patient actually appears to be doing [name] little better in my pinion in  regard to her toe ulcer. With that being said she still does have some evidence of infection at this time and for her culture she had both E. coli as well as enterococcus as organisms noted on evaluation. For that reason I think that though the Keflex likely has treated the E. coli rather well this has really done nothing for the enterococcus. We are going to have to initiate treatment for this specifically. 01/04/19 on evaluation today patient's toe actually appears to be doing better from the standpoint of infection. She currently would like to see about putting the cash back on I think that this is appropriate as long as she takes care of it and keeps it from getting wet. She is gonna have some drainage we can definitely pass this up with Drawtex and alginate to try to prevent  as much drainage as possible from causing the problems. With that being said I do want to at least try her with the cast between now and Tuesday. If there any issues we can't continue to use it then I will discontinue the use of the cast at that point. 01/08/19 on evaluation today patient actually appears to be doing very well as far as her foot ulcer specifically the great toe on the left is concerned. She did have an area of rubbing on the medial aspect of her left ankle which again is from the cast. Fortunately there's no signs of infection at this point in this appears to be [name] very slight skin breakdown. The patient tells me she felt it rubbing but didn't think it was that bad. Fortunately there is no signs of active infection at this time which is good news. No fevers, chills, nausea, or vomiting noted at this time. 01/15/19 on evaluation today patient actually appears to be doing well in regard to her toe ulcer. Again as previous she seems to do well and she has the cast on which indicates to me that during the time she doesn't have [name] cast on she's putting way too much pressure on this region. Obviously I think that's gonna  be an issue as with the current national emergency concerning the Covid-19 Virus it has been recommended that we discontinue the use of total contact casting by the chief medical officer of our company, Dr. Maurine Minister. The reasoning is that if [name] patient becomes sick and cannot come into have the cast removed they could CANTRICE, STULTS [name] ([id number]) [id number] Page 15 of 23 not just leave this on for an additional two weeks. Obviously the hospitals also do not want to receive patient's who are sick into the emergency department to potentially contaminate the region and spread the Covid-19 Virus among other sick individuals within the hospital system. Therefore at this point we are suspending the use of total contact cast until the current emergency subsides. This was all discussed with the patient today as well. 01/22/19 on evaluation today patient's wound on her left great toe appears to be doing slightly worse than previously noted last week. She tells me that she has been on this quite [name] bit in fact she tells me she's been awake for 38 straight hours. This is due to the fact that she's having to care for grandparents because nobody else will. She has been taking care of them for five the last seven days since I've seen her they both have dementia his is from [name] stroke and her grandmother's was progressive. Nonetheless she states even her mom who knows her condition and situation has only help two of those days to take care of them she's been taking care of the rest. Fortunately there does not appear to be any signs of active infection in regard to her toe at this point although obviously it doesn't look as good as it did previous. I think this is directly related to her not taking off the pressure and friction by way of taking things easy. Though I completely understand what's going on. 01/29/19 on evaluation today patient's tools are actually appears to be showing some signs of  improvement today compared to last week's evaluation as far as not necessarily the overall size of the wound but the fact that she has some new skin growth in between the two ends of the wound opening. Overall I feel like she has done well she  states that she had [name] family member give her what sounds to be [name] CAM walker boot which has been helpful as well. 02/05/19 on evaluation today patient's wound bed actually appears to be doing significantly better in regard to her overall appearance of the size of the wound. With that being said she is still having an issue with offloading efficiently enough to get this to close. Apparently there is some signs of infection at this point as well unfortunately. Previously she's done well of Augmentin I really do not see anything that needs to be culture currently but there theme and cellulitis of the foot that I'm seeing I'm gonna go ahead and place her on an antibiotic today to try to help clear this up. 02/12/2019 on evaluation today patient actually appears to be doing poorly in regard to her overall wound status. She tells me she has been using her offloading shoe but actually comes in today wearing her tennis shoe with the AFO brace. Again as I previously discussed with her this is really not sufficient to allow the area to heal appropriately. Nonetheless she continues to be somewhat noncompliant and I do wonder based on what she has told my nurse in the past as to whether or not [name] good portion of this noncompliance may be recreational drug and alcohol related. She has had [name] history of heroin overdose and this was fairly recently in the past couple of months that have been seeing her. Nonetheless overall I feel like her wound looks significantly worse today compared to what it was previous. She still has significant erythema despite the Augmentin I am not sure that this is an appropriate medication for her infection I am also concerned that the infection may have  gone down into her bone. 02/19/19 on evaluation today patient actually appears to be doing about the same in regard to her toe ulcer. Unfortunately she continues to show signs of bone exposure and infection at this point. There does not appear to be any evidence of worsening of the infection but I'm also not really sure that it's getting significantly better. She is on the Augmentin which should be sufficient for the Staphylococcus aureus infection that she has at this point. With that being said she may need IV antibiotics to more appropriately treat this. We did have [name] discussion today about hyperbaric option therapy. 02/28/19 on evaluation today patient actually appears to be doing much worse in regard to the wound on her left great toe as compared to even my previous evaluation last week. Unfortunately this seems to be training in [name] pretty poor direction. Her toe was actually now starting to angle laterally and I can actually see the entire joint area of the proximal portion of the digit where is the distal portion of the digit again is no longer even in contact with the joint line. Unfortunately there's [name] lot more necrotic tissue around the edge and the toe appears to be showing signs of becoming gangrenous in my pinion. I'm very concerned about were things stand at this point. She did see infectious disease and they are planning to send in [name] prescription for Sivextro for her and apparently this has been approved. With that being said I don't think she should avoid taking this but at the same time I'm not sure that it's gonna be sufficient to save her toe at this point. She tells me that she still having to care for grandparents which I think is putting quite [name]  bit of strain on her foot and specifically the total area and has caused this to break down even to [name] greater degree than would've otherwise been expected. 03/05/19 on evaluation today patient actually appears to be doing quite well in regard to  her toe all things considering. She still has bone exposed but there appears to be much less your thing on overall the appearance of the wound and the toe itself is dramatically improved. She still does have some issues currently obviously with infection she did see vascular as well and there concerned that her blood flow to the toad. For that reason they are setting up for an angiogram next week. 03/14/19 on evaluation today patient appears to be doing very poor in regard to her toe and specifically in regard to the ulceration and the fact that she's starting to notice the toe was leaning even more towards the lateral aspect and the complete joint is visible on the proximal aspect of the joint. Nonetheless she's also noted [name] significant odor and the tip of the toe is turning more dark and necrotic appearing. Overall I think she is getting worse not better as far as this is concerned. For that reason I am recommending at this point that she likely needs to be seen for likely amputation. READMISSION 03/19/2021 This is [name] patient that we cared for in this clinic for [name] prolonged period of time in 2019 and 2020 with [name] left foot and left first toe wound. 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 Excell Seltzer. In December 2021 she had an ulcer on her right great toe as well as the fourth and fifth toes. 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 She also had an angiogram at that time and underwent angioplasty of the right anterior tibial artery. In any case she claims that the wound on the right foot is closed I did not look at this today which was probably an oversight although I think that should be done next week. 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 According to Dr. Bernette Redbird last review that she was out of the area  being cared for by another physician but recently came back to his attention. 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 [name] culture of this area showed E. coli apparently before she came back to see Dr. Excell Seltzer she was supposed to be receiving antibiotics but she did not really take them. Nor is she offloading this area at all. Finally her last hemoglobin A1c listed in epic was in March 2022 at 14.1 she says things are [name] lot better since then although I am not sure. 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 She had this wound at the time. During this admission she had E. coli sepsis [name] TEE was negative. 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 She is still [name] heavy cigarette smoker. She has [name] history of bipolar disorder seizure disorder ABI in our clinic was 1.05 6/1; left midfoot in the setting of [name] TMA  done previously. Round circular wound with [name] "knuckle" of protruding tissue. The problem is that the knuckle was not attached to any of the surrounding granulation and this probed proximally widely I removed [name] large portion of this tissue. This wound goes with considerable undermining laterally. I do not feel any bone there was no purulence but this is [name] deep wound. 6/8; in spite of the debridement I did last week. She arrives with [name] wound looking exactly the same. [name] protruding "knuckle" of tissue nonadherent to most of the surrounding tissue. There is considerable depth around this from 6-12 o'clock at 2.7 cm and undermining of 1 cm. This does not look overtly infected and the x- ray I did last week was negative for any osseous abnormalities. We have  been using silver collagen 6/15; deep tissue culture I did last week showed moderate staph aureus and moderate Pseudomonas. This will definitely require prolonged antibiotic therapy. The pathology on the protuberant area was negative for malignancy fungus etc. 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 We have been using silver collagen. I am going to be prescribing Levaquin for 2 weeks. Her CT scan of the foot is down for 7/5 6/22; CT scan of the foot on 7 5. She says she has hardware in the left leg from her previous fracture. She is on the Levaquin for the deep tissue culture I did that showed methicillin sensitive staph aureus and Pseudomonas. I gave her [name] 2-week supply and she will have another week. She arrives in clinic today with the same protuberant tissue however this is nonadherent to the tissue surrounding it. I am really at [name] loss to explain this unless there is underlying deep tissue infection 6/29; patient presents for 1 week follow-up. She has been using collagen to the wound bed. She reports taking her antibiotics as prescribed.She has no complaints or issues today. She denies signs of infection. 7/6; patient presents for one week followup. She has been using collagen to the wound bed. She states she is taking Levaquin however at times she is not able [name], [name] ([id number]) [id number] Page 16 of 23 to keep it down. She denies signs of infection. 7/13; patient presents for 1 week follow-up. She has been using silver alginate to the wound bed. She still has nausea when taking her antibiotics. She denies signs of infection. 7/20; patient presents for 1 week follow-up. She has been using silver alginate with gentamicin cream to the wound bed. She denies any issues and has no complaints today. She denies signs of infection. 7/27; patient presents for 1 week follow-up. She continues to use silver  alginate with gentamicin cream to the wound bed. She reports starting her antibiotics. She has no issues or complaints. Overall she reports stability to the wound. 8/3; patient presents for 1 week follow-up. She has been using silver alginate with gentamicin cream to the wound bed. She reports completing all antibiotics. She has no issues or complaints today. She denies signs of infection. 8/17; patient presents for 2-week follow-up. He is to use silver alginate to the wound bed. She has no issues or complaints today. She denies signs of infection. She reports her pain has improved in her foot since last clinic visit 8/24; patient presents for 1 week follow-up. She continues to use silver alginate to the wound bed. She has no issues or complaints. She denies signs of infection. Pain is stable. 9/7; patient presents for follow-up. She missed  her last week appointment due to feeling ill. She continues to use silver alginate. She has [name] new wound to the right lower extremity that is covered in eschar. She states It occurred over the past week and has no idea how it started. She currently denies signs of infection. 9/14; patient presents for follow-up. T the left foot wound she has been using gentamicin cream and silver alginate. T the right lower extremity wound she has o o been keeping this covered and has not obtain Santyl. 9/21; patient presents for follow-up. She reports using gentamicin cream and silver alginate to the left foot and Santyl to the right lower extremity wound. She has no issues or complaints today. She denies signs of infection. 9/28; patient presents for follow-up. She reports [name] new wound to her right heel. She states this occurred [name] few days ago and is progressively gotten worse. She has been trying to clean the area with [name] Q-tip and Santyl. She reports stability in the other 2 wounds. She has been using gentamicin cream and silver alginate to the left foot and Santyl to the right  lower extremity wound. 10/12; patient presents for follow-up. She reports improvement to the wound beds. She is seeing vein and vascular to discuss the potential of [name] left BKA. She states they are going to do an arteriogram. She continues to use silver alginate with dressing changes to her wounds. 11/2; patient presents for follow-up. She states she has not been doing dressing changes to the wound beds. She states she is not able to offload the areas. She reports chronic pain to her left foot wound. 11/9; patient presents for follow-up. She came in with only socks on. She states she forgot to put on shoes. It is unclear if she is doing any dressing changes. She currently denies systemic signs of infection. 11/16; patient presents for follow-up. She came again only with socks on. She states she does not wear shoes ever. It is unclear if she does dressing changes. She currently denies systemic signs of infection. 11/23; patient presents for follow-up. She wore her shoes today. It still unclear exactly what dressing she is using for each wound but she did states she obtained Dakin's solution and has been using this to the left foot wound. She currently denies signs of infection. 11/30; patient presents for follow-up. She has no issues or complaints today. She currently denies signs of infection. 12/7; patient presents for follow-up. She has no issues or complaints today. She has been using Hydrofera Blue to the right heel wound and Dakin solution to the left foot wound. Her right anterior leg wound is healed. She currently denies signs of infection. 12/14; patient presents for follow-up. She has been using Hydrofera Blue to the right heel and Dakin's to the left foot wounds. She has no issues or complaints today. She denies signs of infection. 12/21; patient presents for follow-up. She reports using Hydrofera Blue to the right heel and Dakin's to the left foot wound. She denies signs of  infection. 12/28; patient presents for follow-up. She continues to use Dakin's to the left foot wound and Hydrofera Blue to the right heel wound. She denies signs of infection. 1/4; patient presents for follow-up. She has no issues or complaints today. She denies signs of infection. 1/11; patient presents for follow-up. It is unclear if she has been dressing these wounds over the past week. She currently denies signs of infection. 1/18; patient presents for follow-up. She states she has been  using Dakin's wet-to-dry dressings to the left foot. She has been using Hydrofera Blue to the right foot foot wound. She states that the anterior right leg wound has reopened and draining serous fluid. She denies signs of infection. 1/25; patient presents for follow-up. She has no issues or complaints today. 2/1; patient presents for follow-up. She has no issues or complaints today. She denies signs of infection. 2/8; patient presents for follow-up. She has lost her surgical shoes. She did not have [name] dressing to the right heel wound. She currently denies signs of infection. 2/15; patient presents for follow-up. She reports more pain to the right heel today. She denies purulent drainage Or fever/chills 2/22; patient presents for follow-up. She reports taking clindamycin over the past week. She states that she continues to have pain to her right heel. She reports purulent drainage. Readmission 03/16/2022 Ms. Aviv Salz is [name] 47 year old female with [name] past medical history of type 2 diabetes, osteomyelitis to her feet, chronic systolic heart failure and bipolar disorder that presents to the clinic for bilateral feet wounds and right lower extremity wound. She was last seen in our clinic on 12/15/2021. At that time she had purulent drainage coming out of her right plantar foot and I recommended she go to the ED. She states she went to [facility] and has been there for the past 3 months. I cannot see the  records. She states she had OR debridement and was on several weeks of IV antibiotics while inpatient. Since discharge she has not been taking care of the wound beds. She had nothing on her feet other than socks today. She currently denies signs of infection. 5/31; patient presents for follow-up. She has been using Dakin's wet-to-dry dressings to the wound beds on her feet bilaterally and antibiotic ointment to the right anterior leg wound. She had [name] wound culture done at last clinic visit that showed moderate Pseudomonas aeruginosa sensitive to ciprofloxacin. She currently denies systemic signs of infection. 6/14; patient presents for follow-up. She received Keystone 5 days ago and has been using this on the wound beds. She states that last week she had to go to the hospital because she had increased warmth and erythema to the right foot. She was started on 2 oral antibiotics. She states she has been taking these. She currently denies systemic signs of infection. She has no issues or complaints today. 6/21; patient presents for follow-up. She states she has been using Keystone antibiotics to the wound beds. She has no issues or complaints today. She denies signs of infection. 6/28; patient presents for follow-up. She has been using Keystone antibiotics to the wound beds. She has no issues or complaints today. 7/12; patient presents for follow-up. Has been using Keystone antibiotics to the wound beds with calcium alginate. She has no issues or complaints today. She MERIBETH, BIENER [name] ([id number]) [id number] Page 17 of 23 never followed up with her orthopedic surgeon who did the OR debridement to the right foot. We discussed the total contact cast for the left foot and patient would like to do this next week. 7/19; patient presents for follow-up. She has been using Keystone antibiotics with calcium alginate to the wound beds. She has no issues or complaints today. Patient is  in agreement to do the total contact cast of the left foot today. She knows to return later this week for the obligatory cast change. 05-13-2022 upon evaluation today patient's wound which she has the cast of the left leg actually  appears to be doing significantly better. Fortunately I do not see any signs of active infection locally or systemically which is great news and overall I am extremely pleased with where we stand currently. 7/26; patient presents for follow-up. She has [name] cast in place for the past week. She states it irritated her shin. Other than that she tolerated the cast well. She states she would like [name] break for 1 week from the cast. We have been using Keystone antibiotic and Aquacel to both wound beds. She denies signs of infection. 8/2; patient presents for follow-up. She has been using Keystone and Aquacel to the wound beds. She denies any issues and has no complaints. She is agreeable to have the cast placed today for the left leg. 06-03-2022 upon evaluation today patient appears to be doing well with regard to her wound she saw some signs of improvement which is great news. Fortunately I do not see any evidence of active infection locally or systemically at this time which is great news. No fevers, chills, nausea, vomiting, or diarrhea. 8/16; patient presents for follow-up. She has no issues or complaints today. We have been using Keystone and Aquacel to the wound beds. The left lower extremity is in [name] total contact cast. She is tolerated this well. 8/23; patient presents for follow-up. She has had the total contact cast on the left leg for the past week. Unfortunately this has rubbed and broken down the skin to the medial foot. She currently denies signs of infection. She has been using Keystone antibiotic to the right plantar foot wound. 8/30; patient presents for follow-up. We have held off on the total contact cast for the left leg for the past week. Her wound on the left foot  has improved and the previous surrounding breakdown of skin has epithelialized. She has been using Keystone antibiotic to both wound beds. She has no issues or complaints today. She denies signs of infection. 9/6; patient presents for follow-up. She has ordered her's Keystone antibiotic and this is arriving this week. She has been doing Dakin's wet-to-dry dressings to the wound beds. She denies signs of infection. She is agreeable to the total contact cast today. 9/13; patient presents for follow-up. She states that the cast caused her left leg shin to be sore. She would like to take [name] break from the cast this week. She has been using Keystone antibiotic to the right plantar foot wound. She denies signs of infection. 9/20; patient presents for follow-up. She has been using Keystone antibiotics to the wound beds with calcium alginate to the right foot wound and Hydrofera Blue to the left foot wound. She is agreeable to having the cast placed today. She has been approved for Apligraf and we will order this for next clinic visit. 9/27; patient presents for follow-up. We have been using Keystone antibiotic with Hydrofera Blue to the left foot wound under [name] total contact cast. T the right o foot wound she has been using Keystone antibiotic and calcium alginate. She declines [name] total contact cast today. Apligraf is available for placement and she would like to proceed with this. 07-28-2022 upon evaluation today patient appears to be doing well currently in regard to her wound. She is actually showing signs of significant improvement which is great news. Fortunately I do not see any evidence of active infection locally nor systemically at this time. She has been seeing Dr. Mikey Bussing and to be honest has been doing very well with the cast. Subsequently she  comes in today with [name] cast on and we did reapply that today as well. She did not really want to she try to talk me out of that but I explained that if she  wanted to heal this is really the right way to go. Patient voiced understanding. In regard to her right foot this is actually [name] lot better compared to the last time I saw her which is also great news. 10/11; patient presents for follow-up. Apligraf and the total contact cast was placed to the left leg at last clinic visit. She states that her right foot wound had burning pain to it with the placement of Apligraf to this area. She has been doing [place] over this area. She denies signs of infection including increased warmth, erythema or purulent drainage. 11/1; 3-week follow-up. The patient fortunately did not have [name] total contact cast or an Apligraf and on the left foot. She has been using Keystone ABD pads and kerlix and her own running shoes She arrives in clinic today with thick callus and [name] very poor surface on the left foot on the right nonviable skin subcutaneous tissue and [name] deep probing hole. 11/15; patient missed her last clinic appointment. She states she has not been dressing the wound beds for the past 2 weeks. She states that at she had [name] new roommate but is now going back to live with her mother. Apparently its been [name] distracting 2 weeks. Patient currently denies signs of infection. 11/22; patient presents for follow-up. She states she has been using Keystone antibiotic and Dakin's wet-to-dry dressings to the wound beds. She is agreeable for cast placement today. We had ordered Apligraf however this has not been received by our facility. 11/29; Patient had [name] total contact cast placed at last clinic visit and she tolerated this well. We were using silver alginate under the cast. Patient's been using Keystone antibiotic with Aquacel to the right plantar foot wound. She has no issues or complaints today. Apligraf is available for placement today. Patient would like to proceed with this. 12/6; patient presents for follow-up. She had Apligraf placed in standard fashion last clinic visit  under the total contact cast to the left lower extremity. She has been using Keystone antibiotic and Aquacel to the right plantar foot wound. She has no issues or complaints today. 12/13; patient presents for follow-up. She has finished 5 Apligraf placements. Was told she would not qualify for more. We have been doing [name] total contact cast to the left lower extremity. She has been using Keystone antibiotic and Aquacel to the right plantar foot wound. She has no issues or complaints today. 12/20; patient presents for follow-up. We have been using Hydrofera Blue with Keystone antibiotic under [name] total contact cast of the left lower extremity. She reports using Keystone antibiotic and silver alginate to the right heel wound. She has no issues or complaints today. 12/27; patient presents with [name] healthy wound on the left midfoot. We have Apligraf to apply that to that more also using [name] total contact cast. On the right we are using Keystone and silver alginate. She is offloading the right heel with [name] surgical shoealthough by her admission she is on her feet quite [name] bit 1/3; patient presents for follow-up. Apligraf was placed to the wound beds last clinic visit. She was placed in [name] total contact cast to the left lower extremity. She declines [name] total contact cast today. She states that her mother is in the hospital and she  cannot adequately get around with the cast on. 1/10; patient presents for follow-up. She declined the total contact cast at last clinic visit. Both wounds have declined in appearance. She states that she has been on her feet and not offloading the wound beds. She currently denies signs of infection. 1/17; patient presents for follow-up. She had the total contact cast along with Apligraf placed last week to the left lower extremity. She tolerated this well. She has been using Aquacel Ag and Keystone antibiotic to the right heel wound. She currently denies signs of infection. She has no issues or  complaints today. 1/24; patient presents for follow-up. We have been using Apligraf to the left foot wound along with [name] total contact cast. She has done well with this. T the right o heel wound she has been using Aquacel Ag and Keystone antibiotic ointment. She has no issues or complaints today. She denies signs of infection. MILLIAN, VESTAL [name] ([id number]) [id number] Page 18 of 23 1/31; patient presents for follow-up. We have been using Apligraf to the left foot wound along with the total contact cast. She continues to do well with this. To the right heel we have been using Aquacel Ag and Keystone antibiotic ointment. She has no issues or complaints today. 12-01-2022 upon evaluation patient is seen today on my schedule due to the fact that she unfortunately was in the hospital yesterday. Her cast needed to come off the only reason she is out of the hospital is due to the fact that they would not take it off at the hospital which is somewhat bewildered reading to me to be perfectly honest. I am not certain why this was but either way she was released and then was placed on my schedule today in order to get this off and reapply the total contact casting as appropriate. I do not have an Apligraf for her today it was applied last week and today's actually expired yesterday as there was some scheduling conflicts with her being in the hospital. Nonetheless we do not have that for reapplication today but the good news that she is not draining too much and the Apligraf can go for up to 2 weeks so I am going to go ahead and reapply the total contact casting but we are going to leave the Apligraf in place. 2/14; patient presents for follow-up. T the left leg she has had the total contact cast and Apligraf for the past week. She has had no issues with this. T the o o right heel she has been using antibiotic ointment and Aquacel Ag. 2/21; patient presents for follow-up. We have been  using Apligraf and [name] total contact cast to the left lower extremity. She is tolerated this well. Unfortunately she is not approved for any more Apligraf per insurance. She has been using antibiotic ointment and Aquacel Ag to the right foot. She has no issues or complaints today. 2/28; patient presents for follow-up. We have been using Hydrofera Blue and antibiotic ointment under the total contact cast to the left lower extremity. He has been using Aquacel Ag with antibiotic ointment to the right plantar foot. She has no issues or complaints today. 3/6; patient presents for follow-up. She did not obtain her gentamicin ointment. She has been using Aquacel Ag to the right plantar foot wound. We have been using Hydrofera Blue with antibiotic ointment under the total contact cast to the left lower extremity. She has no issues or complaints today. 3/12; patient presents for follow-up.  She has been using gentamicin ointment and Aquacel Ag to the right plantar foot wound. We have been using Hydrofera Blue with antibiotic ointment under the total contact cast on the left lower extremity foot wound. She has no issues or complaints today. 3/20; patient presents for follow-up. She has been using gentamicin ointment and Aquacel Ag to the right plantar foot wound. We have been using Hydrofera Blue with antibiotic ointment under the total contact cast to the left lower extremity wound. She has no issues or complaints today. 3/27; patient presents for follow-up. We have been using antibiotic ointment and Aquacel Ag to the right plantar foot wound. We have been using Hydrofera Blue with antibiotic ointment under the total contact cast to the left lower extremity. Both wounds are smaller. 01-24-2023 upon evaluation today patient actually appears to be doing better compared to last time I saw her. It has been several weeks since I last saw her but nonetheless I think the total contact casting is doing [name] good job.  Fortunately I do not see any evidence of infection or worsening overall which is great news and in general I do believe that she is moving in the appropriate direction. It has been [name] very long road but nonetheless I feel like she is nearing the end at least in regard to the left foot and even the right foot looks much better to be perfectly honest. 01-31-2023 upon evaluation today patient appears to be doing well currently in regard to her wounds. She has been require some sharp debridement which I think is probably bone both sides to remove [name] lot of callus that has built up at this point. I discussed that with her today. Also think total contact cast is doing well for the left foot Emina continue that as well. 4/17; patient presents for follow-up. We have been doing Hydrofera Blue with antibiotic ointment under the total contact cast to the left lower extremity and Aquacel Ag with antibiotic ointment to the right foot wound. She has been using her offloading heel shoe here. 4/24; patient presents for follow-up. We have been doing Hydrofera Blue with antibiotic ointment under the total contact cast to the left lower extremity and Aquacel Ag with antibiotic ointment to the right foot wound. She has been using her offloading heel shoe here. Wounds are smaller with slough accumulation. 5/1; patient presents for follow-up. We have been doing Hydrofera Blue with antibiotic ointment under the total contact cast to the left lower extremity and Aquacel Ag with antibiotic ointment to the right foot wound. Left foot wound is stable and right foot wound appears smaller. 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 On the left she is using Hydrofera Blue topical antibiotic under [name] total contact cast, on the right plantar heel gentamicin Aquacel Ag. According to our intake nurse both are making slow but steady improvements. 5/15; patient presents for follow-up. We have been doing Hydrofera Blue with antibiotic  ointment under the total contact cast on the left and antibiotic ointment with Aquacel Ag on the right. Unfortunately the left foot wound has declined in size and appearance. We also do not have the total contact cast available in office today. Patient denies signs of infection. 5/22; patient presents for follow-up. We have been doing Hydrofera Blue and antibiotic ointment to the left foot and Aquacel Ag with antibiotic ointment to the right foot. We took [name] break from the cast since we did not have it placed at last week. Wounds look stable  if not slightly improved. 03-27-2023 patient presents today she has had this For Most 2 Weeks Because She Was in the Hospital and They Did Not T It off. Again That Is Really Not ake Something That She Could Control but Nonetheless We Are Seeing Her at This Point Today for Reevaluation. We Do Want to Go and See about Reapply the Cast. Again We Had All Set Everything up to Go Ahead and Do This When We Went to Get the Cast Only to Realize That Not Had Come in and We Were out at the Moment. With That Being [place] Spaniel We Will Get Have T Get the Exam before We Can Reapply the T Contact Casting Going Forward. o otal 6/12; patient presents for follow-up. She has been using Hydrofera Blue to the left foot wound and Aquacel Ag to the right foot wound. No cast was available last week placed on the patient so she has been offloading with her surgical shoe. She has no issues or complaints today. Objective Constitutional Vitals Time Taken: 11:08 AM, Height: 69 in, Weight: 178 lbs, BMI: 26.3, Temperature: 98.2 F, Pulse: 105 bpm, Respiratory Rate: 16 breaths/min, Blood Pressure: 103/73 mmHg. General Notes: Right foot: T the plantar heel there is an incision site with increased depth, No probing to bone. T the opening there is Slough, granulation o o tissue and callus. Left foot: T the medial aspect there is an open wound with granulation tissue, Callus and slough build up. No overt  signs of infection to any o of the wound beds. Integumentary (Hair, Skin) Wound #11 status is Open. Original cause of wound was Surgical Injury. The date acquired was: 12/01/2021. The wound has been in treatment 55 weeks. The wound is located on the Right,Plantar Foot. The wound measures 0.1cm length x 0.1cm width x 0.3cm depth; 0.008cm^2 area and 0.002cm^3 volume. There is LYLIANNA, ELGART [name] ([id number]) [id number] Page 19 of 23 Fat Layer (Subcutaneous Tissue) exposed. There is [name] small amount of serous drainage noted. The wound margin is distinct with the outline attached to the wound base. There is small (1-33%) pink, pale granulation within the wound bed. There is [name] large (67-100%) amount of necrotic tissue within the wound bed including Adherent Slough. Wound #12 status is Open. Original cause of wound was Pressure Injury. The date acquired was: 03/16/2020. The wound has been in treatment 55 weeks. The wound is located on the Left,Medial,Plantar Foot. The wound measures 1.2cm length x 0.9cm width x 0.1cm depth; 0.848cm^2 area and 0.085cm^3 volume. There is Fat Layer (Subcutaneous Tissue) exposed. There is [name] medium amount of serosanguineous drainage noted. The wound margin is flat and intact. There is large (67-100%) pink, pale granulation within the wound bed. There is [name] small (1-33%) amount of necrotic tissue within the wound bed including Adherent Slough. 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 I debrided nonviable tissue. I recommended continuing the course  with Aquacel to the right heel and Hydrofera Blue to the left foot. The total contact cast Was placed in standard fashion to the left leg. Follow-up in 1 week. Procedures Wound #11 Pre-procedure diagnosis of Wound #11 is an  Open Surgical Wound located on the Right,Plantar Foot . There was [name] Selective/Open Wound Skin/Epidermis Debridement with [name] total area of 0.01 sq cm performed by Geralyn Corwin, MD. With the following instrument(s): Curette to remove Viable and Non-Viable tissue/material. Material removed includes Callus, Slough, Skin: Dermis, and Skin: Epidermis. [name] time out was conducted at 11:42, prior to the start of the procedure. [name] Minimum amount of bleeding was controlled with Pressure. The procedure was tolerated well. Post Debridement Measurements: 0.1cm length x 0.1cm width x 0.3cm depth; 0.002cm^3 volume. Character of Wound/Ulcer Post Debridement is stable. Post procedure Diagnosis Wound #11: Same as Pre-Procedure Wound #12 Pre-procedure diagnosis of Wound #12 is [name] Diabetic Wound/Ulcer of the Lower Extremity located on the Left,Medial,Plantar Foot .Severity of Tissue Pre Debridement is: Fat layer exposed. There was [name] Selective/Open Wound Non-Viable Tissue Debridement with [name] total area of 0.85 sq cm performed by Geralyn Corwin, MD. With the following instrument(s): Curette to remove Viable and Non-Viable tissue/material. Material removed includes Callus and Slough and. [name] time out was conducted at 11:41, prior to the start of the procedure. [name] Minimum amount of bleeding was controlled with Pressure. The procedure was tolerated well. Post Debridement Measurements: 1.2cm length x 0.9cm width x 0.1cm depth; 0.085cm^3 volume. Character of Wound/Ulcer Post Debridement is stable. Severity of Tissue Post Debridement is: Fat layer exposed. Post procedure Diagnosis Wound #12: Same as Pre-Procedure Pre-procedure diagnosis of Wound #12 is [name] Diabetic Wound/Ulcer of the Lower Extremity located on  the Left,Medial,Plantar Foot . There was [name] T Contact otal Cast Procedure by Geralyn Corwin, MD. Post procedure Diagnosis Wound #12: Same as Pre-Procedure Plan Follow-up Appointments: Return Appointment in 1 week. Nurse Visit as needed Bathing/ Shower/ Hygiene: May shower with wound dressing protected with water repellent cover or cast protector. No tub bath. Anesthetic (Use 'Patient Medications' Section for Anesthetic Order Entry): Lidocaine applied to wound bed Edema Control - Lymphedema / Segmental Compressive Device / Other: Elevate, Exercise Daily and Avoid Standing for Long Periods of Time. Elevate legs to the level of the heart and pump ankles as often as possible Elevate leg(s) parallel to the floor when sitting. Off-Loading: T Contact Cast to Left Lower Extremity - tcc applied left leg otal Open toe surgical shoe - Heel off loader - Right foot- Other: - keep pressure off of feet. Additional Orders / Instructions: Follow Nutritious Diet and Increase Protein Intake Other: - Stay off of foot, wheelchair only WOUND #11: - Foot Wound Laterality: Plantar, Right Cleanser: Wound Cleanser 3 x Per Week/30 Days VERNETT, KUCHAREK [name] ([id number]) [id number] Page 20 of 23 Discharge Instructions: Wash your hands with soap and water. Remove old dressing, discard into plastic bag and place into trash. Cleanse the wound with Wound Cleanser prior to applying [name] clean dressing using gauze sponges, not tissues or cotton balls. Do not scrub or use excessive force. Pat dry using gauze sponges, not tissue or cotton balls. Prim Dressing: Aquacel Extra Hydrofiber Dressing, 2x2 (in/in) 3 x Per Week/30 Days ary Secondary Dressing: ABD Pad 5x9 (in/in) (Generic) 3 x Per Week/30 Days Discharge Instructions: Cover with ABD pad Secured With: Medipore T - 33M Medipore H Soft Cloth Surgical T ape ape, 2x2 (in/yd) 3 x Per Week/30 Days Secured With: [employer] or Non-Sterile  6-ply 4.5x4 (yd/yd) 3 x Per Week/30 Days Discharge Instructions: Apply Kerlix as directed WOUND #12: - Foot Wound Laterality: Plantar, Left, Medial Cleanser: Wound Cleanser 1 x Per Week/30 Days Discharge Instructions: Wash your hands with soap  and water. Remove old dressing, discard into plastic bag and place into trash. Cleanse the wound with Wound Cleanser prior to applying [name] clean dressing using gauze sponges, not tissues or cotton balls. Do not scrub or use excessive force. Pat dry using gauze sponges, not tissue or cotton balls. Topical: Gentamicin 1 x Per Week/30 Days Discharge Instructions: Apply as directed by provider. Prim Dressing: Hydrofera Blue Ready Transfer Foam, 2.5x2.5 (in/in) 1 x Per Week/30 Days ary Discharge Instructions: Apply Hydrofera Blue Ready to wound bed as directed Secondary Dressing: heel cup 1 x Per Week/30 Days Discharge Instructions: x2 for heel and front of foot Secured With: Medipore T - 25M Medipore H Soft Cloth Surgical T ape ape, 2x2 (in/yd) 1 x Per Week/30 Days Secured With: [employer] Sterile or Non-Sterile 6-ply 4.5x4 (yd/yd) 1 x Per Week/30 Days Discharge Instructions: Apply Kerlix as directed 1. In office sharp debridement 2. Aquacel Ag to the right heel 3. Hydrofera Blue under the total contact cast to the left leg 4. Follow-up in 1 week Electronic Signature(s) Signed: 04/05/2023 4:14:13 PM By: Geralyn Corwin DO Entered By: Geralyn Corwin on 04/05/2023 13:11:48 -------------------------------------------------------------------------------- ROS/PFSH Details Patient Name: Date of Service: [name], Aryona [name]. 04/05/2023 11:00 [name]: [id number] Patient Account Number: [id number] Date of Birth/Sex: Treating RN: [birth year] (47 y.o. Skip Mayer Primary Care Provider: [name] Other Clinician: [name] Referring Provider: Treating Provider/Extender: Chapman Moss in Treatment:  31 Information Obtained From Patient Ear/Nose/Mouth/Throat Medical History: Positive for: Chronic sinus problems/congestion; Middle ear problems Hematologic/Lymphatic Medical History: Positive for: Anemia Respiratory Medical History: Positive for: Chronic Obstructive Pulmonary Disease (COPD) Cardiovascular Medical History: Positive for: Congestive Heart Failure - EF 33% Oberhaus, Eleena [name] ([id number]) [id number] Page 21 of 23 Endocrine Medical History: Positive for: Type II Diabetes Time with diabetes: 14 years Treated with: Insulin, Oral agents Blood sugar tested every day: No Blood sugar testing results: Bedtime: 176 Genitourinary Medical History: Positive for: End Stage Renal Disease Integumentary (Skin) Medical History: Positive for: History of pressure wounds Neurologic Medical History: Positive for: Neuropathy - feel and legs HBO Extended History Items Ear/Nose/Mouth/Throat: Ear/Nose/Mouth/Throat: Chronic sinus Middle ear problems problems/congestion Immunizations Pneumococcal Vaccine: Received Pneumococcal Vaccination: No Implantable Devices No devices added Family and Social History Current every day smoker; Marital Status - Single; Alcohol Use: Never; Drug Use: Prior History; Caffeine Use: Daily Electronic Signature(s) Signed: 04/05/2023 4:14:13 PM By: Geralyn Corwin DO Signed: 04/07/2023 12:16:56 PM By: Elliot Gurney, BSN, RN, CWS, [name] RN, BSN Entered By: Geralyn Corwin on 04/05/2023 13:12:58 -------------------------------------------------------------------------------- Total Contact Cast Details Patient Name: Date of Service: Heather Jefferson [name]. 04/05/2023 11:00 [name]: [id number] Patient Account Number: [id number] Date of Birth/Sex: Treating RN: [birth year] (47 y.o. Cathlean Cower, [name] Primary Care Provider: [name] Other Clinician: [name] Referring Provider: Treating Provider/Extender: Chapman Moss in Treatment: 61 T Contact Cast Applied for Wound Assessment: otal Wound #12 Left,Medial,Plantar Foot Performed By: Physician Geralyn Corwin, MD Post Procedure Diagnosis Same as Pre-procedure Tobey Grim ([id number]) [id number] Page 22 of 23 Electronic Signature(s) Signed: 04/05/2023 4:14:13 PM By: Geralyn Corwin DO Signed: 04/05/2023 4:43:31 PM By: [name] Entered By: [name] on 04/05/2023 11:42:21 -------------------------------------------------------------------------------- SuperBill Details Patient Name: Date of Service: Heather Jefferson [name]. 04/05/2023 Medical Record Number: [id number] Patient Account Number: [id number] Date of Birth/Sex: Treating RN: [birth year] (47 y.o. 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.621 Type 2 diabetes mellitus with foot ulcer E11.42 Type 2 diabetes mellitus with diabetic polyneuropathy L97.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 of skin Z79.4 Long term (current) use of insulin Facility Procedures : CPT4 Code: 04540981 Description: 97597 - DEBRIDE WOUND 1ST 20 SQ CM OR < ICD-10 Diagnosis Description 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.621 Type 2 diabetes mellitus with foot ulcer Modifier: Quantity: 1 : CPT4 Code: 19147829 Description: 56213 - APPLY TOTAL CONTACT LEG CAST ICD-10 Diagnosis Description 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i E11.621 Type 2  diabetes mellitus with foot ulcer Modifier: ty Quantity: 1 Physician Procedures : CPT4 Code Description Modifier [id number] 97597 - WC PHYS DEBR WO ANESTH 20 SQ CM ICD-10 Diagnosis Description 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.621 Type 2 diabetes mellitus with foot ulcer Quantity: 1 : [id number] 29445 - WC PHYS APPLY TOTAL CONTACT CAST ICD-10 Diagnosis Description 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ity E11.621 Type 2 diabetes mellitus with foot ulcer Quantity: 1 Electronic Signature(s) Signed: 04/05/2023 4:14:13 PM By: Geralyn Corwin DO Entered By: Geralyn Corwin on 04/05/2023 13:12:33 [name] ([id number]) [id number] Page 23 of 23

## 2023-04-12 ENCOUNTER — Encounter (HOSPITAL_BASED_OUTPATIENT_CLINIC_OR_DEPARTMENT_OTHER): Payer: Medicaid Other | Admitting: Internal Medicine

## 2023-04-12 DIAGNOSIS — E11621 Type 2 diabetes mellitus with foot ulcer: Secondary | ICD-10-CM

## 2023-04-12 DIAGNOSIS — L97528 Non-pressure chronic ulcer of other part of left foot with other specified severity: Secondary | ICD-10-CM | POA: Diagnosis not present

## 2023-04-17 NOTE — Progress Notes (Signed)
[name], [name] ([id number]) [id number] Page 1 of 22 Visit Report for 04/12/2023 Chief Complaint Document Details Patient Name: Date of Service: [name], [name] 04/12/2023 11:00 [name]: [id number] Patient Account Number: [id number] Date of Birth/Sex: Treating RN: 1976/10/04 (47 y.o. Female) Huel Coventry Primary Care Provider: [name] Other Clinician: Referring Provider: Treating Provider/Extender: Chapman Moss in Treatment: 76 Information Obtained from: Patient Chief Complaint 03/19/2021; patient referred by Dr. Excell Seltzer who has been looking after her left foot for quite [name] period of time for review of [name] nonhealing area in the left midfoot 03/12/2022; bilateral feet wounds and right lower extremity wound. Electronic Signature(s) Signed: 04/13/2023 12:49:28 PM By: Geralyn Corwin DO Entered By: Geralyn Corwin on 04/12/2023 12:10:35 -------------------------------------------------------------------------------- Debridement Details Patient Name: Date of Service: [name], [name]. 04/12/2023 11:00 [name]: [id number] Patient Account Number: [id number] Date of Birth/Sex: Treating RN: 08/13/1976 (47 y.o. Female) Huel Coventry Primary Care Provider: [name] Other Clinician: Referring Provider: Treating Provider/Extender: Chapman Moss in Treatment: 56 Debridement Performed for Assessment: Wound #12 Left,Medial,Plantar Foot Performed By: Physician Geralyn Corwin, MD Debridement Type: Debridement Severity of Tissue Pre Debridement: Fat layer exposed Level of Consciousness (Pre-procedure): Awake and Alert Pre-procedure Verification/Time Out Yes - 11:44 Taken: Start Time: 11:44 Percent of Wound Bed Debrided: 100% T Area Debrided (cm): otal 0.38 Tissue and other material debrided: Viable, Non-Viable, Callus, Slough, Skin: Dermis , Skin: Epidermis, Slough Level:  Skin/Epidermis Debridement Description: Selective/Open Wound Instrument: Curette Bleeding: Minimum Hemostasis Achieved: Pressure Response to Treatment: Procedure was tolerated well Level of Consciousness (Post- Awake and Alert EMALYNN, CLEWIS [name] ([id number]) [id number] Page 2 of 22 Awake and Alert procedure): Post Debridement Measurements of Total Wound Length: (cm) 0.8 Width: (cm) 0.6 Depth: (cm) 0.1 Volume: (cm) 0.038 Character of Wound/Ulcer Post Debridement: Improved Severity of Tissue Post Debridement: Fat layer exposed Post Procedure Diagnosis Same as Pre-procedure Electronic Signature(s) Signed: 04/13/2023 12:49:28 PM By: Geralyn Corwin DO Signed: 04/13/2023 5:12:55 PM By: [name] Signed: 04/17/2023 9:09:50 AM By: Elliot Gurney, BSN, RN, CWS, [name] RN, BSN Entered By: [name] on 04/12/2023 11:45:02 -------------------------------------------------------------------------------- HPI Details Patient Name: Date of Service: [name], [name] 04/12/2023 11:00 [name]: [id number] Patient Account Number: [id number] Date of Birth/Sex: Treating RN: [birth year] (47 y.o. Female) Huel Coventry Primary Care Provider: [name] Other Clinician: Referring Provider: Treating Provider/Extender: Chapman Moss in Treatment: 2 History of Present Illness HPI Description: 01/18/18-She is here for initial evaluation of the left great toe ulcer. She is [name] poor historian in regards to timeframe in detail. She states approximately 4 weeks ago she lacerated her toe on something in the house. She followed up with her primary care who placed her on Bactrim and ultimately [name] second dose of Bactrim prior to coming to wound clinic. She states she has been treating the toe with peroxide, Betadine and [name] Band-Aid. She did not check her blood sugar this morning but checked it yesterday morning it was 327; she is unaware of [name] recent A1c  and there are no current records. She saw Dr. she would've orthopedics last week for an old injury to the left ankle, she states he did not see her toe, nor did she bring it to his attention. She smokes approximately 1 pack cigarettes [name] day. Her social situation is concerning, she arrives this morning with her mother who appears extremely intoxicated/under the influence; her mother was asked to leave  the room and be monitored by the patient's grandmother. The patient's aunt then accompanied the patient and the room throughout the rest of the appointment. We had [name] lengthy discussion regarding the deleterious effects of uncontrolled hyperglycemia and smoking as it relates to wound healing and overall health. She was strongly encouraged to decrease her smoking and get her diabetes under better control. She states she is currently on [name] diet and has cut down her [facility] consumption. The left toe is erythematous, macerated and slightly edematous with malodor present. The edema in her left foot is below her baseline, there is no erythema streaking. We will treat her with Santyl, doxycycline; we have ordered and xray, culture and provided [name] Peg assist surgical shoe and cultured the wound. 01/25/18-She is here in follow-up evaluation for [name] left great toe ulcer and presents with an abscess to her suprapubic area. She states her blood sugars remain elevated, feeling "sick" and if levels are below 250, but she is trying. She has made no attempt to decrease her smoking stating that we "can't take away her food in her cigarettes". She has been compliant with offloading using the PEG assist you. She is using Santyl daily. the culture obtained last week grew staph aureus and Enterococcus faecalis; continues on the doxycycline and Augmentin was added on Monday. The suprapubic area has erythema, no femoral variation, purple discoloration, minimal induration, was accessed with [name] cotton tip applicator with  sanguinopurulent drainage, this was cultured, I suspect the current antibiotic treatment will cover and we will not add anything to her current treatment plan. She was advised to go to urgent care or ER with any change in redness, induration or fever. 02/01/18-She is here in follow-up evaluation for left great toe ulcers and [name] new abdominal abscess from last week. She was able to use packing until earlier this week, where she "forgot it was there". She states she was feeling ill with GI symptoms last week and was not taking her antibiotic. She states her glucose levels have been predominantly less than 200, with occasional levels between 200-250. She thinks this was contributing to her GI symptoms as they have resolved without intervention. There continues to be significant laceration to left toe, otherwise it clinically looks stable/improved. There is now less superficial opening to the lateral aspect of the great toe that was residual blister. We will transition to [facility] to all wounds, she will continue her Augmentin. If there is no change or deterioration next week for reculture. 02/08/18-She is here in follow-up evaluation for left great toe ulcer and abdominal ulcer. There is an improvement in both wounds. She has been wrapping her left toe with coban, not by our direction, which has created an area of discoloration to the medial aspect; she has been advised to NOT use coban secondary to her neuropathy. She states her glucose levels have been high over this last week ranging from 200-350, she continues to smoke. She admits to being less compliant with her offloading shoe. We will continue with same treatment plan and she will follow-up next week. 02/15/18-She is here in follow-up evaluation for left great toe ulcer and abdominal ulcer. The abdominal ulcer is epithelialized. The left great toe ulcer is improved and all injury from last week using the Coban wrap is resolved, the lateral ulcer  is healed. She admits to noncompliance with wearing offloading shoe and admits to glucose levels being greater than 300 most of the week. She continues to smoke and expresses  no desire to quit. There is one area medially that probes deeper than it has historically, erythema to the toe and dorsal foot has consistently waxed and waned. There is no overt signs of cellulitis or infection but we will culture the wound for any occult infection given the new area of depth and erythema. We will hold off on sensitivities for initiation of antibiotic therapy. 02/22/18-She is here in follow up evaluation for left great toe ulcer. There is overall significant improvement in both wound appearance, erythema and edema with AHLAYAH, TARKOWSKI [name] ([id number]) [phone].pdf Page 3 of 22 changes made last week. She was not initiated on antibiotic therapy. Culture obtained last week showed oxacillin sensitive staph aureus, sensitive to clindamycin. Clindamycin has been called into the pharmacy but she has been instructed to hold off on initiation secondary to overall clinical improvement and her history of antibiotic intolerance. She has been instructed to contact the clinic with any noted changes/deterioration and the wound, erythema, edema and/or pain. She will follow-up next week. She continues to smoke and her glucose levels remain elevated >250; she admits to compliance with offloading shoe 03/01/18 on evaluation today patient appears to be doing fairly well in regard to her left first toe ulcer. She has been tolerating the dressing changes with the [facility] Dressing without complication and overall this has definitely showed signs of improvement according to records as well is what the patient tells me today. I'm very pleased in that regard. She is having no pain today 03/08/18 She is here for follow up evaluation of [name] left great toe ulcer. She remains non-compliant with glucose control and  smoking cessation; glucose levels consistently >200. She states that she got new shoe inserts/peg assist. She admits to compliance with offloading. Since my last evaluation there is significant improvement. We will switch to prisma at this time and she will follow up next week. She is noted to be tachycardic at this appointment, heart rate 120s; she has [name] history of heart rate 70-130 according to our records. She admits to extreme agitation r/t personal issues; she was advised to monitor her heartrate and contact her physician if it does not return to [name] more normal range (<100). She takes cardizem twice daily. 03/15/18-She is here in follow-up evaluation for left great toe ulcer. She remains noncompliant with glucose control and smoking cessation. She admits to compliance with wearing offloading shoe. The ulcer is improved/stable and we will continue with the same treatment plan and she will follow-up next week 03/22/18-She is here for evaluation for left great toe ulcer. There continues to be significant improvement despite recurrent hyperglycemia (over 500 yesterday) and she continues to smoke. She has been compliant with offloading and we will continue with same treatment plan and she will follow-up next week. 03/29/18-She is here for evaluation for left great toe ulcer. Despite continuing to smoke and uncontrolled diabetes she continues to improve. She is compliant with offloading shoe. We will continue with the same treatment plan and she will follow-up next week 04/05/18- She is here in follow up evaluation for [name] left great toe ulcer; she presents with small pustule to left fifth toe (resembles ant bite). She admits to compliance with wearing offloading shoe; continues to smoke or have uncontrolled blood glucose control. There is more callus than usual with evidence of bleeding; she denies known trauma. 04/12/18-She is here for evaluation of left great toe ulcer. Despite noncompliance with glycemic  control and smoking she continues to make improvement. She  continues to wear offloading shoe. The pustule, that was identified last week, to the left fifth toe is resolved. She will follow-up in 2 weeks 05/03/18-she is seen in follow-up evaluation for [name] left great toe ulcer. She is compliant with offloading, otherwise noncompliant with glycemic control and smoking. She has plateaued and there is minimal improvement noted. We will transition to [facility], replaced the insert to her surgical shoe and she will follow-up in one week 05/10/18- She is here in follow up evaluation for [name] left great toe ulcer. It appears stable despite measurement change. We will continue with same treatment plan and follow up next week. 05/24/18-She is seen in follow-up evaluation for [name] left great toe ulcer. She remains compliant with offloading, has made significant improvement in her diet, decreasing the amount of sugar/soda. She said her recent A1c was 10.9 which is lower than. She did see [name] diabetic nutritionist/educator yesterday. She continues to smoke. We will continue with the same treatment plan and she'll follow-up next week. 05/31/18- She is seen in follow-up evaluation for left great toe ulcer. She continues to remain compliant with offloading, continues to make improvement in her diet, increasing her water and decreasing the amount of sugar/soda. She does continue to smoke with no desire to quit. We will apply Prisma to the depth and Hydrofera Blue over. We have not received insurance authorization for oasis. She will follow up next week. 06/07/18-She is seen in follow-up evaluation for left great toe ulcer. It has stalled according to today's measurements although base appears stable. She says she saw [name] diabetic educator yesterday; her average blood sugars are less than 300 which is an improvement for her. She continues to smoke and states "that's my next step" She continues with water over soda. We will order for  xray, culture and reinstate ace wrap compression prior to placing apligraf for next week. She is voicing no complaints or concerns. Her dressing will change to iodoflex over the next week in preparation for apligraf. 06/14/18-She is seen in follow-up evaluation for left great toe ulcer. Plain film x-ray performed last week was negative for osteomyelitis. Wound culture obtained last week grew strep B and OSSA; she is initiated on keflex and cefdinir today; there is erythema to the toe which could be from ace wrap compression, she has [name] history of wrapping too tight and has has been encouraged to maintain ace wraps that we place today. We will hold off on application of apligraf today, will apply next week after antibiotic therapy has been initiated. She admits today that she has resumed taking [name] shower with her foot/toe submerged in water, she has been reminded to keep foot/toe out of the bath water. She will be seen in follow up next week 06/21/18-she is seen in follow-up evaluation for left great toe ulcer. She is tolerating antibiotic therapy with no GI disturbance. The wound is stable. Apligraf was applied today. She has been decreasing her smoking, only had 4 cigarettes yesterday and 1 today. She continues being more compliant in diabetic diet. She will follow-up next week for evaluation of site, if stable will remove at 2 weeks. 06/28/18- She is here in follow up evalution. Apligraf was placed last week, she states the dressing fell off on Tuesday and she was dressing with hydrofera blue. She is healed and will be discharged from the clinic today. She has been instructed to continue with smoking cessation, continue monitoring glucose levels, offloading for an additional 4 weeks and continue with  hydrofera blue for additional two weeks for any possible microscopic opening. Readmission: 08/07/18 on evaluation today patient presents for reevaluation concerning the ulcer of her right great toe. She was  previously discharged on 06/28/18 healed. Nonetheless she states that this began to show signs of drainage she subsequently went to her primary care provider. Subsequently an x-ray was performed on 08/01/18 which was negative. The patient was also placed on antibiotics at that time. Fortunately they should have been effective for the infection. Nonetheless she's been experiencing some improvement but still has [name] lot of drainage coming from the wound itself. 08/14/18 on evaluation today patient's wound actually does show signs of improvement in regard to the erythema at this point. She has completed the antibiotics. With that being said we did discuss the possibility of placing her in [name] total contact cast as of today although I think that I may want to give this just [name] little bit more time to ensure nothing recurrence as far as her infection is concerned. I do not want to put in the cast and risk infection at that time if things are not completely resolved. With that being said she is gonna require some debridement today. 08/21/18 on evaluation today patient actually appears to be doing okay in regard to her toe ulcer. She's been tolerating the dressing changes without complication. With that being said it does appear that she is ready and in fact I think it's appropriate for [place] to go ahead and initiate the total contact cast today. Nonetheless she will require some sharp debridement to prepare the wound for application. Overall I feel like things have been progressing well but we do need to do something to get this to close more readily. 08/24/18 patient seen today for reevaluation after having had the total contact cast applied on Tuesday. She seems to have done very well the wound appears to be doing great and overall I'm pleased with the progress that she's made. There were no abnormal areas of rubbing from the cast on her lower extremity. 08/30/18 on evaluation today patient actually appears to be  completely healed in regard to her plantar toe ulcer. She tells me at this point she's been having [name] lot of issues with the cast. She almost fell [name] couple of times the state shall the step of her dog [name] couple times as well. This is been [name] very frustrating process for her other nonetheless she has completely healed the wound which is excellent news. Overall there does not appear to be the evidence of infection at this time which is great news. 09/11/18 evaluation today patient presents for follow-up concerning her great toe ulcer on the left which has unfortunately reopened since I last saw her which was only [name] couple of weeks ago. Unfortunately she was not able to get in to get the shoe and potentially the AFO that's gonna be necessary due to her left foot drop. She continues with offloading shoe but this is not enough to prevent her from reopening it appears. When we last had her in the total contact cast she did well from [name] healing standpoint but unfortunately the wound reopened as soon as she came out of the cast within just [name] couple of weeks. Right now the biggest concern is that I do believe the foot drop is leading to the issue and this is gonna continue to be an issue unfortunately until we get things under control as far as the walking anomaly is concerned with  the foot drop. This is also part of the reason why she falls on [name] regular basis. I just do not believe that is gonna be safe for [place] to reinitiate the total contact cast as last time we had this on she fell 3 times one week which is definitely not normal for her. 09/18/18 upon evaluation today the patient actually appears to be doing about the same in regard to her toe ulcer. She did not contact [facility] as I asked her to even though I had given her the prescription. In fact she actually states that she has no idea where the prescription is. She did apparently call [employer] and they told her that all she needed to do was bring the  prescription in order to be able to be seen and work on getting the AFO for her left foot. With all that being said she still does not have an appointment and I'm not sure were things stand that regard. I will give her [name] new prescription today in order to contact them to get this set up. 09/25/18 on evaluation today patient actually appears to be doing about the same in regard to her toes ulcer. She does have [name] small areas which seems to have TYREONA, PANJWANI [name] ([id number]) [id number] Page 4 of 22 [name] lot of callous buildup around the edge of the wound which is going to need sharp debridement today. She still is waiting to be scheduled for evaluation with [facility] for possibility of an AFO. She states there supposed to call her tomorrow to get this set up. Unfortunately it does appear that her foot specifically the toe area is showing signs of erythema. There does not appear to be any systemic infection which is in these good news. 10/02/18 on evaluation today patient actually appears to be doing about the same in regard to her toe ulcer. This really has not done too well although it's not significantly larger it's also not significantly smaller. She has been tolerating the dressing changes without complication. She actually has her appointment with [facility] and [facility] tomorrow to hopefully be measured for obtaining and AFO splint. I think this would be helpful preventing this from reoccurring. We had contemplated starting the cast this week although to be honest I am reluctant to do that as she's been having nausea, vomiting, and seizure activity over the past three days. She has [name] history of seizures and have been told is nothing that can be done for these. With that being said I do believe that along with the seizures have the nausea vomiting which upon further questioning doesn't seem to be the normal for her and makes me concerned for the possibility of infection or  something else going on. I discussed this with the patient and her mother during the office visit today. I do not feel the wound is effective but maybe something else. The responses this was "this just happens to her at times and we don't know why". They did not seem to be interested in going to the hospital to have this checked out further. 10/09/18 on evaluation today patient presents for follow-up concerning her ongoing toe ulcer. She has been tolerating the dressing changes without complication. Fortunately there does not appear to be any evidence of infection which is great news however I do think that the patient would benefit from going ahead for with the total contact cast. She's actually in [name] wheelchair today she tells me that she will use her walker if we  initiate the cast. I was very specific about the fact that if we were gonna do the cast I wanted to make sure that she was using the walker in order to prevent any falls. She tells me she does not have stairs that she has to traverse on [name] regular basis at her home. She has not had any seizures since last week again that something that happens to her often she tells me she did talk to [employer] and they said that it may take up to three weeks to get the brace approved for her. Hopefully that will not take that long but nonetheless in the meantime I do think the cast could be of benefit. 10/12/18 on evaluation today patient appears to be doing rather well in regard to her toe ulcer. It's just been [name] few days and already this is significantly improved both as far as overall appearance and size. Fortunately there's no sign of infection. She is here for her first obligatory cast change. 10/19/18 Seen today for follow up and management of left great toe ulcer. Wound continues to show improvement. Noted small open area with seroussang drainage with palpation. Denies any increased pain or recent fevers during visit. She will continue calcium alginate  with offloading shoe. Denies any questions or concerns during visit. 10/26/18 on evaluation today patient appears to be doing about the same as when I last saw her in regard to her wound bed. Fortunately there does not appear to be any signs of infection. Unfortunately she continues to have [name] breakdown in regard to the toe region any time that she is not in the cast. It takes almost no time at all for this to happen. Nonetheless she still has not heard anything from the brace being made by [employer] as to when exactly this will be available to her. Fortunately there is no signs of infection at this time. 10/30/18 on evaluation today patient presents for application of the total contact cast as we just received him this morning. Fortunately we are gonna be able to apply this to her today which is great news. She continues to have no significant pain which is good news. Overall I do feel like things have been improving while she was the cast is when she doesn't have [name] cast that things get worse. She still has not really heard anything from [employer] regarding her brace. 11/02/18 upon evaluation today patient's wound already appears to be doing significantly better which is good news. Fortunately there does not appear to be any signs of infection also good news. Overall I do think the total contact cast as before is helping to heal this area unfortunately it's just not gonna likely keep the area closed and healed without her getting her brace at least. Again the foot drop is [name] significant issue for her. 11/09/18 on evaluation today patient appears to be doing excellent in regard to her toe ulcer which in fact is completely healed. Fortunately we finally got the situation squared away with the paperwork which was needed to proceed with getting her brace approved by Medicaid. I have filled that out unfortunately that information has been sent to the orthopedic office that I worked at 2 1/2 years ago and not tired  Current wound care measures. Fortunately she seems to be doing very well at this time. 11/23/18 on evaluation today patient appears to be doing More Poorly Compared to Last Time I Saw Her. At [facility] She Had Completely Healed. Currently she is  continuing to have issues with reopening. She states that she just found out that the brace was approved through Medicaid now she just has to go get measured in order to have this fitted for her and then made. Subsequently she does not have an appointment for this yet that is going to complicate things we obviously cannot put her back in the cast if we do not have everything measured because they're not gonna be able to measure her foot while she is in the cast. 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 Obviously this is unfortunate and does have me somewhat worried as well. 11/30/18 on evaluation today patient's toe ulcer actually appears to be doing fairly well. The good news is she will be getting her brace in the shoes next week on Wednesday. Hopefully we will be able to get this to heal without having to go back in the cast however she may need the cast in order to get the wound completely heal and then go from there. Fortunately there's no signs of infection at this time. 12/07/18 on evaluation today patient fortunately did receive her brace and she states she could tell this definitely makes her walk better. With that being said she's been having issues with her toe where she noticed yesterday there was [name] lot of tissue that was loosing off this appears to be much larger than what it was previous. She also states that her leg has been read putting much across the top of her foot just about the ankle although this seems to be receiving somewhat. The total area is still red and appears to be someone infected as best I can tell. She is previously taken Bactrim  and that may be [name] good option for her today as well. We are gonna see what I wound culture shows as well and I think that this is definitely appropriate. With that being said outside of the culture I still need to initiate something in the interim and that's what I'm gonna go ahead and select Bactrim is [name] good option for her. 12/14/18 on evaluation today patient appears to be doing better in regard to her left great toe ulcer as compared to last week's evaluation. There's still some erythema although this is significantly improved which is excellent news. Overall I do believe that she is making good progress is still gonna take some time before she is where I would like her to be from the standpoint of being able to place her back into the total contact cast. Hopefully we will be where we need to be by next week. 12/21/18 on evaluation today patient actually appears to be doing poorly in regard to her toe ulcer. She's been tolerating the dressing changes without complication. Fortunately there's no signs of systemic infection although she does have [name] lot of drainage from the toe ulcer and this does seem to be causing some issues at this point. She does have erythema on the distal portion of her toe that appears to be likely cellulitis. 12/28/18 on evaluation today patient actually appears to be doing [name] little better in my pinion in regard to her toe ulcer. With that being said she still does have some evidence of infection at this time and for her culture she had both E. coli as well as enterococcus as organisms noted on evaluation. For that reason I think that  though the Keflex likely has treated the E. coli rather well this has really done nothing for the enterococcus. We are going to have to initiate treatment for this specifically. 01/04/19 on evaluation today patient's toe actually appears to be doing better from the standpoint of infection. She currently would like to see about putting the cash back  on I think that this is appropriate as long as she takes care of it and keeps it from getting wet. She is gonna have some drainage we can definitely pass this up with Drawtex and alginate to try to prevent as much drainage as possible from causing the problems. With that being said I do want to at least try her with the cast between now and Tuesday. If there any issues we can't continue to use it then I will discontinue the use of the cast at that point. 01/08/19 on evaluation today patient actually appears to be doing very well as far as her foot ulcer specifically the great toe on the left is concerned. She did have an area of rubbing on the medial aspect of her left ankle which again is from the cast. Fortunately there's no signs of infection at this point in this appears to be [name] very slight skin breakdown. The patient tells me she felt it rubbing but didn't think it was that bad. Fortunately there is no signs of active infection at this time which is good news. No fevers, chills, nausea, or vomiting noted at this time. 01/15/19 on evaluation today patient actually appears to be doing well in regard to her toe ulcer. Again as previous she seems to do well and she has the cast on which indicates to me that during the time she doesn't have [name] cast on she's putting way too much pressure on this region. Obviously I think that's gonna be an issue as with the current national emergency concerning the Covid-19 Virus it has been recommended that we discontinue the use of total contact casting by DANEAN, MARNER [name] ([id number]) [id number] Page 5 of 22 the chief medical officer of our company, Dr. Maurine Minister. The reasoning is that if [name] patient becomes sick and cannot come into have the cast removed they could not just leave this on for an additional two weeks. Obviously the hospitals also do not want to receive patient's who are sick into the emergency department to potentially contaminate  the region and spread the Covid-19 Virus among other sick individuals within the hospital system. Therefore at this point we are suspending the use of total contact cast until the current emergency subsides. This was all discussed with the patient today as well. 01/22/19 on evaluation today patient's wound on her left great toe appears to be doing slightly worse than previously noted last week. She tells me that she has been on this quite [name] bit in fact she tells me she's been awake for 38 straight hours. This is due to the fact that she's having to care for grandparents because nobody else will. She has been taking care of them for five the last seven days since I've seen her they both have dementia his is from [name] stroke and her grandmother's was progressive. Nonetheless she states even her mom who knows her condition and situation has only help two of those days to take care of them she's been taking care of the rest. Fortunately there does not appear to be any signs of active infection in regard to her toe at this point although  obviously it doesn't look as good as it did previous. I think this is directly related to her not taking off the pressure and friction by way of taking things easy. Though I completely understand what's going on. 01/29/19 on evaluation today patient's tools are actually appears to be showing some signs of improvement today compared to last week's evaluation as far as not necessarily the overall size of the wound but the fact that she has some new skin growth in between the two ends of the wound opening. Overall I feel like she has done well she states that she had [name] family member give her what sounds to be [name] CAM walker boot which has been helpful as well. 02/05/19 on evaluation today patient's wound bed actually appears to be doing significantly better in regard to her overall appearance of the size of the wound. With that being said she is still having an issue with offloading  efficiently enough to get this to close. Apparently there is some signs of infection at this point as well unfortunately. Previously she's done well of Augmentin I really do not see anything that needs to be culture currently but there theme and cellulitis of the foot that I'm seeing I'm gonna go ahead and place her on an antibiotic today to try to help clear this up. 02/12/2019 on evaluation today patient actually appears to be doing poorly in regard to her overall wound status. She tells me she has been using her offloading shoe but actually comes in today wearing her tennis shoe with the AFO brace. Again as I previously discussed with her this is really not sufficient to allow the area to heal appropriately. Nonetheless she continues to be somewhat noncompliant and I do wonder based on what she has told my nurse in the past as to whether or not [name] good portion of this noncompliance may be recreational drug and alcohol related. She has had [name] history of heroin overdose and this was fairly recently in the past couple of months that have been seeing her. Nonetheless overall I feel like her wound looks significantly worse today compared to what it was previous. She still has significant erythema despite the Augmentin I am not sure that this is an appropriate medication for her infection I am also concerned that the infection may have gone down into her bone. 02/19/19 on evaluation today patient actually appears to be doing about the same in regard to her toe ulcer. Unfortunately she continues to show signs of bone exposure and infection at this point. There does not appear to be any evidence of worsening of the infection but I'm also not really sure that it's getting significantly better. She is on the Augmentin which should be sufficient for the Staphylococcus aureus infection that she has at this point. With that being said she may need IV antibiotics to more appropriately treat this. We did have [name]  discussion today about hyperbaric option therapy. 02/28/19 on evaluation today patient actually appears to be doing much worse in regard to the wound on her left great toe as compared to even my previous evaluation last week. Unfortunately this seems to be training in [name] pretty poor direction. Her toe was actually now starting to angle laterally and I can actually see the entire joint area of the proximal portion of the digit where is the distal portion of the digit again is no longer even in contact with the joint line. Unfortunately there's [name] lot more necrotic tissue around  the edge and the toe appears to be showing signs of becoming gangrenous in my pinion. I'm very concerned about were things stand at this point. She did see infectious disease and they are planning to send in [name] prescription for Sivextro for her and apparently this has been approved. With that being said I don't think she should avoid taking this but at the same time I'm not sure that it's gonna be sufficient to save her toe at this point. She tells me that she still having to care for grandparents which I think is putting quite [name] bit of strain on her foot and specifically the total area and has caused this to break down even to [name] greater degree than would've otherwise been expected. 03/05/19 on evaluation today patient actually appears to be doing quite well in regard to her toe all things considering. She still has bone exposed but there appears to be much less your thing on overall the appearance of the wound and the toe itself is dramatically improved. She still does have some issues currently obviously with infection she did see vascular as well and there concerned that her blood flow to the toad. For that reason they are setting up for an angiogram next week. 03/14/19 on evaluation today patient appears to be doing very poor in regard to her toe and specifically in regard to the ulceration and the fact that she's starting to notice  the toe was leaning even more towards the lateral aspect and the complete joint is visible on the proximal aspect of the joint. Nonetheless she's also noted [name] significant odor and the tip of the toe is turning more dark and necrotic appearing. Overall I think she is getting worse not better as far as this is concerned. For that reason I am recommending at this point that she likely needs to be seen for likely amputation. READMISSION 03/19/2021 This is [name] patient that we cared for in this clinic for [name] prolonged period of time in 2019 and 2020 with [name] left foot and left first toe wound. 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 Excell Seltzer. In December 2021 she had an ulcer on her right great toe as well as the fourth and fifth toes. 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 She also had an angiogram at that time and underwent angioplasty of the right anterior tibial artery. In any case she claims that the wound on the right foot is closed I did not look at this today which was probably an oversight although I think that should be done next week. 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 According to Dr. Bernette Redbird last review that she was out of the area being cared for by another physician but recently came back to his attention. 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 [name] culture of this area showed E. coli apparently before she came back to see Dr. Excell Seltzer she was supposed to be receiving antibiotics but she did not really take them. Nor is she offloading this area at all. Finally her last hemoglobin A1c listed in epic was in March 2022 at 14.1 she says things are [name] lot better since then although I am not sure. 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 She had this wound at the time. During this admission she had E. coli sepsis [name] TEE was negative. 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 She is still [name] heavy cigarette smoker. She has [name] history of bipolar disorder seizure disorder ABI in our clinic was 1.05 6/1; left midfoot in the setting of [name] TMA done previously. Round circular wound with [name] "knuckle" of protruding tissue. The problem is that the knuckle was not attached to any of the surrounding granulation and this probed proximally widely I removed [name] large portion of this tissue. This wound goes with considerable undermining laterally. I do not feel any bone there was no purulence but this is [name] deep wound. 6/8; in spite of the debridement I did last week. She arrives with [name] wound looking exactly the same. [name] protruding "knuckle" of tissue nonadherent to most of the surrounding tissue. There is considerable depth around this from 6-12 o'clock at 2.7 cm and undermining of 1 cm. This does not look overtly infected and the x- ray I did last week was negative for any osseous abnormalities. We have been using silver collagen 6/15; deep tissue culture I did last week showed moderate staph aureus and moderate Pseudomonas. This will definitely require prolonged antibiotic therapy. The pathology on the protuberant area was negative for malignancy fungus etc. 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 We have been using silver collagen. I am going to be prescribing Levaquin for 2 weeks. Her CT scan of the foot is down for 7/5 6/22; CT scan of the foot on 7 5. She says she has hardware in the left leg from  her previous fracture. She is on the Levaquin for the deep tissue culture I did that showed methicillin sensitive staph aureus and Pseudomonas. I gave her [name] 2-week supply and she will have another week. She arrives in clinic today with the same protuberant tissue however this is nonadherent to the tissue surrounding it. I am really at [name] loss to explain this unless there is underlying deep tissue infection 6/29; patient presents for 1 week follow-up. She has been using collagen to the wound bed. She reports taking her antibiotics as prescribed.She has no complaints or issues today. She denies signs of infection. ABBAGALE, GOGUEN [name] ([id number]) [id number] Page 6 of 22 7/6; patient presents for one week followup. She has been using collagen to the wound bed. She states she is taking Levaquin however at times she is not able to keep it down. She denies signs of infection. 7/13; patient presents for 1 week follow-up. She has been using silver alginate to the wound bed. She still has nausea when taking her antibiotics. She denies signs of infection. 7/20; patient presents for 1 week follow-up. She has been using silver alginate with gentamicin cream to the wound bed. She denies any issues and has no complaints today. She denies signs of infection. 7/27; patient presents for 1 week follow-up. She continues to use silver alginate with gentamicin cream to the wound bed. She reports starting her antibiotics. She has no issues or complaints. Overall she reports stability to the wound. 8/3; patient presents for 1 week follow-up. She has been using silver alginate with gentamicin cream to  the wound bed. She reports completing all antibiotics. She has no issues or complaints today. She denies signs of infection. 8/17; patient presents for 2-week follow-up. He is to use silver alginate to the wound bed. She has no issues or complaints today. She denies signs of infection. She reports her  pain has improved in her foot since last clinic visit 8/24; patient presents for 1 week follow-up. She continues to use silver alginate to the wound bed. She has no issues or complaints. She denies signs of infection. Pain is stable. 9/7; patient presents for follow-up. She missed her last week appointment due to feeling ill. She continues to use silver alginate. She has [name] new wound to the right lower extremity that is covered in eschar. She states It occurred over the past week and has no idea how it started. She currently denies signs of infection. 9/14; patient presents for follow-up. T the left foot wound she has been using gentamicin cream and silver alginate. T the right lower extremity wound she has o o been keeping this covered and has not obtain Santyl. 9/21; patient presents for follow-up. She reports using gentamicin cream and silver alginate to the left foot and Santyl to the right lower extremity wound. She has no issues or complaints today. She denies signs of infection. 9/28; patient presents for follow-up. She reports [name] new wound to her right heel. She states this occurred [name] few days ago and is progressively gotten worse. She has been trying to clean the area with [name] Q-tip and Santyl. She reports stability in the other 2 wounds. She has been using gentamicin cream and silver alginate to the left foot and Santyl to the right lower extremity wound. 10/12; patient presents for follow-up. She reports improvement to the wound beds. She is seeing vein and vascular to discuss the potential of [name] left BKA. She states they are going to do an arteriogram. She continues to use silver alginate with dressing changes to her wounds. 11/2; patient presents for follow-up. She states she has not been doing dressing changes to the wound beds. She states she is not able to offload the areas. She reports chronic pain to her left foot wound. 11/9; patient presents for follow-up. She came in with only socks  on. She states she forgot to put on shoes. It is unclear if she is doing any dressing changes. She currently denies systemic signs of infection. 11/16; patient presents for follow-up. She came again only with socks on. She states she does not wear shoes ever. It is unclear if she does dressing changes. She currently denies systemic signs of infection. 11/23; patient presents for follow-up. She wore her shoes today. It still unclear exactly what dressing she is using for each wound but she did states she obtained Dakin's solution and has been using this to the left foot wound. She currently denies signs of infection. 11/30; patient presents for follow-up. She has no issues or complaints today. She currently denies signs of infection. 12/7; patient presents for follow-up. She has no issues or complaints today. She has been using Hydrofera Blue to the right heel wound and Dakin solution to the left foot wound. Her right anterior leg wound is healed. She currently denies signs of infection. 12/14; patient presents for follow-up. She has been using Hydrofera Blue to the right heel and Dakin's to the left foot wounds. She has no issues or complaints today. She denies signs of infection. 12/21; patient presents for follow-up. She reports  using Hydrofera Blue to the right heel and Dakin's to the left foot wound. She denies signs of infection. 12/28; patient presents for follow-up. She continues to use Dakin's to the left foot wound and Hydrofera Blue to the right heel wound. She denies signs of infection. 1/4; patient presents for follow-up. She has no issues or complaints today. She denies signs of infection. 1/11; patient presents for follow-up. It is unclear if she has been dressing these wounds over the past week. She currently denies signs of infection. 1/18; patient presents for follow-up. She states she has been using Dakin's wet-to-dry dressings to the left foot. She has been using Hydrofera Blue to  the right foot foot wound. She states that the anterior right leg wound has reopened and draining serous fluid. She denies signs of infection. 1/25; patient presents for follow-up. She has no issues or complaints today. 2/1; patient presents for follow-up. She has no issues or complaints today. She denies signs of infection. 2/8; patient presents for follow-up. She has lost her surgical shoes. She did not have [name] dressing to the right heel wound. She currently denies signs of infection. 2/15; patient presents for follow-up. She reports more pain to the right heel today. She denies purulent drainage Or fever/chills 2/22; patient presents for follow-up. She reports taking clindamycin over the past week. She states that she continues to have pain to her right heel. She reports purulent drainage. Readmission 03/16/2022 Ms. Dazaria Macneill is [name] 47 year old female with [name] past medical history of type 2 diabetes, osteomyelitis to her feet, chronic systolic heart failure and bipolar disorder that presents to the clinic for bilateral feet wounds and right lower extremity wound. She was last seen in our clinic on 12/15/2021. At that time she had purulent drainage coming out of her right plantar foot and I recommended she go to the ED. She states she went to [facility] and has been there for the past 3 months. I cannot see the records. She states she had OR debridement and was on several weeks of IV antibiotics while inpatient. Since discharge she has not been taking care of the wound beds. She had nothing on her feet other than socks today. She currently denies signs of infection. 5/31; patient presents for follow-up. She has been using Dakin's wet-to-dry dressings to the wound beds on her feet bilaterally and antibiotic ointment to the right anterior leg wound. She had [name] wound culture done at last clinic visit that showed moderate Pseudomonas aeruginosa sensitive to ciprofloxacin. She currently denies  systemic signs of infection. 6/14; patient presents for follow-up. She received Keystone 5 days ago and has been using this on the wound beds. She states that last week she had to go to the hospital because she had increased warmth and erythema to the right foot. She was started on 2 oral antibiotics. She states she has been taking these. She currently denies systemic signs of infection. She has no issues or complaints today. 6/21; patient presents for follow-up. She states she has been using Keystone antibiotics to the wound beds. She has no issues or complaints today. She denies signs of infection. 6/28; patient presents for follow-up. She has been using Keystone antibiotics to the wound beds. She has no issues or complaints today. SHABRIA, EGLEY [name] ([id number]) [id number] Page 7 of 22 7/12; patient presents for follow-up. Has been using Keystone antibiotics to the wound beds with calcium alginate. She has no issues or complaints today. She never followed up with  her orthopedic surgeon who did the OR debridement to the right foot. We discussed the total contact cast for the left foot and patient would like to do this next week. 7/19; patient presents for follow-up. She has been using Keystone antibiotics with calcium alginate to the wound beds. She has no issues or complaints today. Patient is in agreement to do the total contact cast of the left foot today. She knows to return later this week for the obligatory cast change. 05-13-2022 upon evaluation today patient's wound which she has the cast of the left leg actually appears to be doing significantly better. Fortunately I do not see any signs of active infection locally or systemically which is great news and overall I am extremely pleased with where we stand currently. 7/26; patient presents for follow-up. She has [name] cast in place for the past week. She states it irritated her shin. Other than that she tolerated the cast  well. She states she would like [name] break for 1 week from the cast. We have been using Keystone antibiotic and Aquacel to both wound beds. She denies signs of infection. 8/2; patient presents for follow-up. She has been using Keystone and Aquacel to the wound beds. She denies any issues and has no complaints. She is agreeable to have the cast placed today for the left leg. 06-03-2022 upon evaluation today patient appears to be doing well with regard to her wound she saw some signs of improvement which is great news. Fortunately I do not see any evidence of active infection locally or systemically at this time which is great news. No fevers, chills, nausea, vomiting, or diarrhea. 8/16; patient presents for follow-up. She has no issues or complaints today. We have been using Keystone and Aquacel to the wound beds. The left lower extremity is in [name] total contact cast. She is tolerated this well. 8/23; patient presents for follow-up. She has had the total contact cast on the left leg for the past week. Unfortunately this has rubbed and broken down the skin to the medial foot. She currently denies signs of infection. She has been using Keystone antibiotic to the right plantar foot wound. 8/30; patient presents for follow-up. We have held off on the total contact cast for the left leg for the past week. Her wound on the left foot has improved and the previous surrounding breakdown of skin has epithelialized. She has been using Keystone antibiotic to both wound beds. She has no issues or complaints today. She denies signs of infection. 9/6; patient presents for follow-up. She has ordered her's Keystone antibiotic and this is arriving this week. She has been doing Dakin's wet-to-dry dressings to the wound beds. She denies signs of infection. She is agreeable to the total contact cast today. 9/13; patient presents for follow-up. She states that the cast caused her left leg shin to be sore. She would like to take  [name] break from the cast this week. She has been using Keystone antibiotic to the right plantar foot wound. She denies signs of infection. 9/20; patient presents for follow-up. She has been using Keystone antibiotics to the wound beds with calcium alginate to the right foot wound and Hydrofera Blue to the left foot wound. She is agreeable to having the cast placed today. She has been approved for Apligraf and we will order this for next clinic visit. 9/27; patient presents for follow-up. We have been using Keystone antibiotic with Hydrofera Blue to the left foot wound under [name] total contact  cast. T the right o foot wound she has been using Keystone antibiotic and calcium alginate. She declines [name] total contact cast today. Apligraf is available for placement and she would like to proceed with this. 07-28-2022 upon evaluation today patient appears to be doing well currently in regard to her wound. She is actually showing signs of significant improvement which is great news. Fortunately I do not see any evidence of active infection locally nor systemically at this time. She has been seeing Dr. Mikey Bussing and to be honest has been doing very well with the cast. Subsequently she comes in today with [name] cast on and we did reapply that today as well. She did not really want to she try to talk me out of that but I explained that if she wanted to heal this is really the right way to go. Patient voiced understanding. In regard to her right foot this is actually [name] lot better compared to the last time I saw her which is also great news. 10/11; patient presents for follow-up. Apligraf and the total contact cast was placed to the left leg at last clinic visit. She states that her right foot wound had burning pain to it with the placement of Apligraf to this area. She has been doing [place] over this area. She denies signs of infection including increased warmth, erythema or purulent drainage. 11/1; 3-week follow-up. The  patient fortunately did not have [name] total contact cast or an Apligraf and on the left foot. She has been using Keystone ABD pads and kerlix and her own running shoes She arrives in clinic today with thick callus and [name] very poor surface on the left foot on the right nonviable skin subcutaneous tissue and [name] deep probing hole. 11/15; patient missed her last clinic appointment. She states she has not been dressing the wound beds for the past 2 weeks. She states that at she had [name] new roommate but is now going back to live with her mother. Apparently its been [name] distracting 2 weeks. Patient currently denies signs of infection. 11/22; patient presents for follow-up. She states she has been using Keystone antibiotic and Dakin's wet-to-dry dressings to the wound beds. She is agreeable for cast placement today. We had ordered Apligraf however this has not been received by our facility. 11/29; Patient had [name] total contact cast placed at last clinic visit and she tolerated this well. We were using silver alginate under the cast. Patient's been using Keystone antibiotic with Aquacel to the right plantar foot wound. She has no issues or complaints today. Apligraf is available for placement today. Patient would like to proceed with this. 12/6; patient presents for follow-up. She had Apligraf placed in standard fashion last clinic visit under the total contact cast to the left lower extremity. She has been using Keystone antibiotic and Aquacel to the right plantar foot wound. She has no issues or complaints today. 12/13; patient presents for follow-up. She has finished 5 Apligraf placements. Was told she would not qualify for more. We have been doing [name] total contact cast to the left lower extremity. She has been using Keystone antibiotic and Aquacel to the right plantar foot wound. She has no issues or complaints today. 12/20; patient presents for follow-up. We have been using Hydrofera Blue with Keystone antibiotic  under [name] total contact cast of the left lower extremity. She reports using Keystone antibiotic and silver alginate to the right heel wound. She has no issues or complaints today. 12/27; patient presents  with [name] healthy wound on the left midfoot. We have Apligraf to apply that to that more also using [name] total contact cast. On the right we are using Keystone and silver alginate. She is offloading the right heel with [name] surgical shoealthough by her admission she is on her feet quite [name] bit 1/3; patient presents for follow-up. Apligraf was placed to the wound beds last clinic visit. She was placed in [name] total contact cast to the left lower extremity. She declines [name] total contact cast today. She states that her mother is in the hospital and she cannot adequately get around with the cast on. 1/10; patient presents for follow-up. She declined the total contact cast at last clinic visit. Both wounds have declined in appearance. She states that she has been on her feet and not offloading the wound beds. She currently denies signs of infection. 1/17; patient presents for follow-up. She had the total contact cast along with Apligraf placed last week to the left lower extremity. She tolerated this well. She has been using Aquacel Ag and Keystone antibiotic to the right heel wound. She currently denies signs of infection. She has no issues or complaints today. 1/24; patient presents for follow-up. We have been using Apligraf to the left foot wound along with [name] total contact cast. She has done well with this. T the right o heel wound she has been using Aquacel Ag and Keystone antibiotic ointment. She has no issues or complaints today. She denies signs of infection. TASHAI, [name] ([id number]) [id number] Page 8 of 22 1/31; patient presents for follow-up. We have been using Apligraf to the left foot wound along with the total contact cast. She continues to do well with this. To the right heel  we have been using Aquacel Ag and Keystone antibiotic ointment. She has no issues or complaints today. 12-01-2022 upon evaluation patient is seen today on my schedule due to the fact that she unfortunately was in the hospital yesterday. Her cast needed to come off the only reason she is out of the hospital is due to the fact that they would not take it off at the hospital which is somewhat bewildered reading to me to be perfectly honest. I am not certain why this was but either way she was released and then was placed on my schedule today in order to get this off and reapply the total contact casting as appropriate. I do not have an Apligraf for her today it was applied last week and today's actually expired yesterday as there was some scheduling conflicts with her being in the hospital. Nonetheless we do not have that for reapplication today but the good news that she is not draining too much and the Apligraf can go for up to 2 weeks so I am going to go ahead and reapply the total contact casting but we are going to leave the Apligraf in place. 2/14; patient presents for follow-up. T the left leg she has had the total contact cast and Apligraf for the past week. She has had no issues with this. T the o o right heel she has been using antibiotic ointment and Aquacel Ag. 2/21; patient presents for follow-up. We have been using Apligraf and [name] total contact cast to the left lower extremity. She is tolerated this well. Unfortunately she is not approved for any more Apligraf per insurance. She has been using antibiotic ointment and Aquacel Ag to the right foot. She has no issues or complaints today. 2/28;  patient presents for follow-up. We have been using Hydrofera Blue and antibiotic ointment under the total contact cast to the left lower extremity. He has been using Aquacel Ag with antibiotic ointment to the right plantar foot. She has no issues or complaints today. 3/6; patient presents for follow-up. She  did not obtain her gentamicin ointment. She has been using Aquacel Ag to the right plantar foot wound. We have been using Hydrofera Blue with antibiotic ointment under the total contact cast to the left lower extremity. She has no issues or complaints today. 3/12; patient presents for follow-up. She has been using gentamicin ointment and Aquacel Ag to the right plantar foot wound. We have been using Hydrofera Blue with antibiotic ointment under the total contact cast on the left lower extremity foot wound. She has no issues or complaints today. 3/20; patient presents for follow-up. She has been using gentamicin ointment and Aquacel Ag to the right plantar foot wound. We have been using Hydrofera Blue with antibiotic ointment under the total contact cast to the left lower extremity wound. She has no issues or complaints today. 3/27; patient presents for follow-up. We have been using antibiotic ointment and Aquacel Ag to the right plantar foot wound. We have been using Hydrofera Blue with antibiotic ointment under the total contact cast to the left lower extremity. Both wounds are smaller. 01-24-2023 upon evaluation today patient actually appears to be doing better compared to last time I saw her. It has been several weeks since I last saw her but nonetheless I think the total contact casting is doing [name] good job. Fortunately I do not see any evidence of infection or worsening overall which is great news and in general I do believe that she is moving in the appropriate direction. It has been [name] very long road but nonetheless I feel like she is nearing the end at least in regard to the left foot and even the right foot looks much better to be perfectly honest. 01-31-2023 upon evaluation today patient appears to be doing well currently in regard to her wounds. She has been require some sharp debridement which I think is probably bone both sides to remove [name] lot of callus that has built up at this point. I  discussed that with her today. Also think total contact cast is doing well for the left foot Emina continue that as well. 4/17; patient presents for follow-up. We have been doing Hydrofera Blue with antibiotic ointment under the total contact cast to the left lower extremity and Aquacel Ag with antibiotic ointment to the right foot wound. She has been using her offloading heel shoe here. 4/24; patient presents for follow-up. We have been doing Hydrofera Blue with antibiotic ointment under the total contact cast to the left lower extremity and Aquacel Ag with antibiotic ointment to the right foot wound. She has been using her offloading heel shoe here. Wounds are smaller with slough accumulation. 5/1; patient presents for follow-up. We have been doing Hydrofera Blue with antibiotic ointment under the total contact cast to the left lower extremity and Aquacel Ag with antibiotic ointment to the right foot wound. Left foot wound is stable and right foot wound appears smaller. 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 On the left she is using Hydrofera Blue topical antibiotic under [name] total contact cast, on the right plantar heel gentamicin Aquacel Ag. According to our intake nurse both are making slow but steady improvements. 5/15; patient presents for follow-up. We have been doing  Hydrofera Blue with antibiotic ointment under the total contact cast on the left and antibiotic ointment with Aquacel Ag on the right. Unfortunately the left foot wound has declined in size and appearance. We also do not have the total contact cast available in office today. Patient denies signs of infection. 5/22; patient presents for follow-up. We have been doing Hydrofera Blue and antibiotic ointment to the left foot and Aquacel Ag with antibiotic ointment to the right foot. We took [name] break from the cast since we did not have it placed at last week. Wounds look stable if not slightly improved. 03-27-2023 patient presents  today she has had this For Most 2 Weeks Because She Was in the Hospital and They Did Not T It off. Again That Is Really Not ake Something That She Could Control but Nonetheless We Are Seeing Her at This Point Today for Reevaluation. We Do Want to Go and See about Reapply the Cast. Again We Had All Set Everything up to Go Ahead and Do This When We Went to Get the Cast Only to Realize That Not Had Come in and We Were out at the Moment. With That Being York Spaniel We Will Get Have T Get the Exam before We Can Reapply the T Contact Casting Going Forward. o otal 6/12; patient presents for follow-up. She has been using Hydrofera Blue to the left foot wound and Aquacel Ag to the right foot wound. No cast was available last week placed on the patient so she has been offloading with her surgical shoe. She has no issues or complaints today. 6/19; patient presents for follow-up. We have been using Hydrofera Blue to the left foot wound under the total contact cast and Aquacel Ag to the right wound. She has no issues or complaints today. The wounds are smaller. Electronic Signature(s) Signed: 04/13/2023 12:49:28 PM By: Geralyn Corwin DO Entered By: Geralyn Corwin on 04/12/2023 12:11:10 [name] ([id number]) [id number] Page 9 of 22 -------------------------------------------------------------------------------- Physical Exam Details Patient Name: Date of Service: KOI, YARBRO 04/12/2023 11:00 [name]: [id number] Patient Account Number: [id number] Date of Birth/Sex: Treating RN: [birth year] (47 y.o. Female) Huel Coventry Primary Care Provider: [name] Other Clinician: Referring Provider: Treating Provider/Extender: Chapman Moss in Treatment: 80 Constitutional . Cardiovascular . Psychiatric . Notes Right foot: T the plantar heel there is an incision site with increased depth, No probing to bone. T the opening there is  Skin and granulation tissue Left foot: T o o o the medial aspect there is an open wound with granulation tissue, Callus and slough build up. No overt signs of infection to any of the wound beds. Electronic Signature(s) Signed: 04/13/2023 12:49:28 PM By: Geralyn Corwin DO Entered By: Geralyn Corwin on 04/12/2023 12:11:54 -------------------------------------------------------------------------------- Physician Orders Details Patient Name: Date of Service: [name] 04/12/2023 11:00 [name]: [id number] Patient Account Number: [id number] Date of Birth/Sex: Treating RN: 09/03/1976 (47 y.o. Female) Huel Coventry Primary Care Provider: [name] Other Clinician: Referring Provider: Treating Provider/Extender: Chapman Moss in Treatment: 50 Verbal / Phone Orders: Yes Clinician: Huel Coventry Read Back and Verified: Yes Diagnosis Coding Follow-up Appointments Return Appointment in 1 week. Nurse Visit as needed Bathing/ Shower/ Hygiene May shower with wound dressing protected with water repellent cover or cast protector. No tub bath. Anesthetic (Use 'Patient Medications' Section for Anesthetic Order Entry) Lidocaine applied to wound bed DANELIA, SNODGRASS [name] ([id number]) [phone].pdf Page 10 of 22  Edema Control - Lymphedema / Segmental Compressive Device / Other Elevate, Exercise Daily and [name] void Standing for Long Periods of Time. Elevate legs to the level of the heart and pump ankles as often as possible Elevate leg(s) parallel to the floor when sitting. Off-Loading Total Contact Cast to Left Lower Extremity - tcc applied left leg Open toe surgical shoe - Heel off loader - Right foot- Other: - keep pressure off of feet. Additional Orders / Instructions Follow Nutritious Diet and Increase Protein Intake Other: - Stay off of foot, wheelchair only Wound Treatment Wound #11 - Foot Wound Laterality: Plantar,  Right Cleanser: Wound Cleanser 3 x Per Week/30 Days Discharge Instructions: Wash your hands with soap and water. Remove old dressing, discard into plastic bag and place into trash. Cleanse the wound with Wound Cleanser prior to applying [name] clean dressing using gauze sponges, not tissues or cotton balls. Do not scrub or use excessive force. Pat dry using gauze sponges, not tissue or cotton balls. Prim Dressing: Aquacel Extra Hydrofiber Dressing, 2x2 (in/in) ary 3 x Per Week/30 Days Secondary Dressing: ABD Pad 5x9 (in/in) (Generic) 3 x Per Week/30 Days Discharge Instructions: Cover with ABD pad Secured With: Medipore T - 79M Medipore H Soft Cloth Surgical T ape ape, 2x2 (in/yd) 3 x Per Week/30 Days Secured With: [employer] or Non-Sterile 6-ply 4.5x4 (yd/yd) 3 x Per Week/30 Days Discharge Instructions: Apply Kerlix as directed Wound #12 - Foot Wound Laterality: Plantar, Left, Medial Cleanser: Wound Cleanser 1 x Per Week/30 Days Discharge Instructions: Wash your hands with soap and water. Remove old dressing, discard into plastic bag and place into trash. Cleanse the wound with Wound Cleanser prior to applying [name] clean dressing using gauze sponges, not tissues or cotton balls. Do not scrub or use excessive force. Pat dry using gauze sponges, not tissue or cotton balls. Prim Dressing: Hydrofera Blue Ready Transfer Foam, 2.5x2.5 (in/in) 1 x Per Week/30 Days ary Discharge Instructions: Apply Hydrofera Blue Ready to wound bed as directed Secondary Dressing: heel cup 1 x Per Week/30 Days Discharge Instructions: x2 for heel and front of foot Secured With: Medipore T - 79M Medipore H Soft Cloth Surgical T ape ape, 2x2 (in/yd) 1 x Per Week/30 Days Secured With: [employer] or Non-Sterile 6-ply 4.5x4 (yd/yd) 1 x Per Week/30 Days Discharge Instructions: Apply Kerlix as directed Electronic Signature(s) Signed: 04/13/2023 12:49:28 PM By: Geralyn Corwin DO Entered By: Geralyn Corwin on  04/12/2023 12:13:17 -------------------------------------------------------------------------------- Problem List Details Patient Name: Date of Service: [name] 04/12/2023 11:00 [name]: [id number] Patient Account Number: [id number] Date of Birth/Sex: Treating RN: [birth year] (47 y.o. Female) Huel Coventry Primary Care Provider: [name] Other Clinician: Referring Provider: Treating Provider/Extender: Chapman Moss in Treatment: 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 Female) Huel Coventry Primary Care Provider: [name] Other Clinician: Referring Provider: Treating Provider/Extender: Chapman Moss in Treatment: 65 Subjective Chief Complaint Information obtained from Patient 03/19/2021; patient referred by  Dr. Excell Seltzer who has been looking after her left foot for quite [name] period of time for review of [name] nonhealing area in the left midfoot 03/12/2022; bilateral feet wounds and right lower extremity wound. History of Present Illness (HPI) 01/18/18-She is here for initial evaluation of the left great toe ulcer. She is [name] poor historian in regards to timeframe in detail. She states approximately 4 weeks ago she lacerated her toe on something in the house. She followed up with her primary care who placed her on Bactrim and ultimately [name] second dose of Bactrim prior to coming to wound clinic. She states she has been treating the toe with peroxide, Betadine and [name] Band-Aid. She did not check her blood sugar this morning but checked it yesterday morning it was 327; she is unaware of [name] recent A1c and there are no current records. She saw Dr. she would've orthopedics last week for an old injury to the left ankle, she states he did not see her toe, nor did she bring it to his attention. She smokes approximately 1 pack cigarettes Shader, Barbee [name] ([id number]) [id number] Page 12 of 22 [name] day. Her social situation is concerning, she arrives this morning with her mother who appears extremely intoxicated/under the influence; her mother was asked to leave the room and be monitored by the patient's grandmother. The patient's aunt then accompanied the patient and the room throughout the rest of the appointment. We had [name] lengthy discussion regarding the deleterious effects of uncontrolled hyperglycemia and smoking as it relates to wound healing and overall health. She was strongly encouraged to decrease her smoking and get her diabetes under better control. She states she is currently on [name] diet and has cut down her [facility] consumption. The left toe is erythematous, macerated and slightly edematous with malodor present. The edema in her left foot is below her baseline, there is no erythema streaking. We  will treat her with Santyl, doxycycline; we have ordered and xray, culture and provided [name] Peg assist surgical shoe and cultured the wound. 01/25/18-She is here in follow-up evaluation for [name] left great toe ulcer and presents with an abscess to her suprapubic area. She states her blood sugars remain elevated, feeling "sick" and if levels are below 250, but she is trying. She has made no attempt to decrease her smoking stating that we "can't take away her food in her cigarettes". She has been compliant with offloading using the PEG assist you. She is using Santyl daily. the culture obtained last week grew staph aureus and Enterococcus faecalis; continues on the doxycycline and Augmentin was added on Monday. The suprapubic area has erythema, no femoral variation, purple discoloration, minimal induration, was accessed with [name] cotton tip applicator with sanguinopurulent drainage, this was cultured, I suspect the current antibiotic treatment will cover and we will not add anything to her current treatment plan. She was advised to go to urgent care or ER with any change in redness, induration or fever. 02/01/18-She is here in follow-up evaluation for left great toe ulcers and [name] new abdominal abscess from last week. She  was able to use packing until earlier this week, where she "forgot it was there". She states she was feeling ill with GI symptoms last week and was not taking her antibiotic. She states her glucose levels have been predominantly less than 200, with occasional levels between 200-250. She thinks this was contributing to her GI symptoms as they have resolved without intervention. There continues to be significant laceration to left toe, otherwise it clinically looks stable/improved. There is now less superficial opening to the lateral aspect of the great toe that was residual blister. We will transition to [facility] to all wounds, she will continue her Augmentin. If there is no change or  deterioration next week for reculture. 02/08/18-She is here in follow-up evaluation for left great toe ulcer and abdominal ulcer. There is an improvement in both wounds. She has been wrapping her left toe with coban, not by our direction, which has created an area of discoloration to the medial aspect; she has been advised to NOT use coban secondary to her neuropathy. She states her glucose levels have been high over this last week ranging from 200-350, she continues to smoke. She admits to being less compliant with her offloading shoe. We will continue with same treatment plan and she will follow-up next week. 02/15/18-She is here in follow-up evaluation for left great toe ulcer and abdominal ulcer. The abdominal ulcer is epithelialized. The left great toe ulcer is improved and all injury from last week using the Coban wrap is resolved, the lateral ulcer is healed. She admits to noncompliance with wearing offloading shoe and admits to glucose levels being greater than 300 most of the week. She continues to smoke and expresses no desire to quit. There is one area medially that probes deeper than it has historically, erythema to the toe and dorsal foot has consistently waxed and waned. There is no overt signs of cellulitis or infection but we will culture the wound for any occult infection given the new area of depth and erythema. We will hold off on sensitivities for initiation of antibiotic therapy. 02/22/18-She is here in follow up evaluation for left great toe ulcer. There is overall significant improvement in both wound appearance, erythema and edema with changes made last week. She was not initiated on antibiotic therapy. Culture obtained last week showed oxacillin sensitive staph aureus, sensitive to clindamycin. Clindamycin has been called into the pharmacy but she has been instructed to hold off on initiation secondary to overall clinical improvement and her history of antibiotic intolerance. She  has been instructed to contact the clinic with any noted changes/deterioration and the wound, erythema, edema and/or pain. She will follow-up next week. She continues to smoke and her glucose levels remain elevated >250; she admits to compliance with offloading shoe 03/01/18 on evaluation today patient appears to be doing fairly well in regard to her left first toe ulcer. She has been tolerating the dressing changes with the [facility] Dressing without complication and overall this has definitely showed signs of improvement according to records as well is what the patient tells me today. I'm very pleased in that regard. She is having no pain today 03/08/18 She is here for follow up evaluation of [name] left great toe ulcer. She remains non-compliant with glucose control and smoking cessation; glucose levels consistently >200. She states that she got new shoe inserts/peg assist. She admits to compliance with offloading. Since my last evaluation there is significant improvement. We will switch to prisma at this time and  she will follow up next week. She is noted to be tachycardic at this appointment, heart rate 120s; she has [name] history of heart rate 70-130 according to our records. She admits to extreme agitation r/t personal issues; she was advised to monitor her heartrate and contact her physician if it does not return to [name] more normal range (<100). She takes cardizem twice daily. 03/15/18-She is here in follow-up evaluation for left great toe ulcer. She remains noncompliant with glucose control and smoking cessation. She admits to compliance with wearing offloading shoe. The ulcer is improved/stable and we will continue with the same treatment plan and she will follow-up next week 03/22/18-She is here for evaluation for left great toe ulcer. There continues to be significant improvement despite recurrent hyperglycemia (over 500 yesterday) and she continues to smoke. She has been compliant with offloading and  we will continue with same treatment plan and she will follow-up next week. 03/29/18-She is here for evaluation for left great toe ulcer. Despite continuing to smoke and uncontrolled diabetes she continues to improve. She is compliant with offloading shoe. We will continue with the same treatment plan and she will follow-up next week 04/05/18- She is here in follow up evaluation for [name] left great toe ulcer; she presents with small pustule to left fifth toe (resembles ant bite). She admits to compliance with wearing offloading shoe; continues to smoke or have uncontrolled blood glucose control. There is more callus than usual with evidence of bleeding; she denies known trauma. 04/12/18-She is here for evaluation of left great toe ulcer. Despite noncompliance with glycemic control and smoking she continues to make improvement. She continues to wear offloading shoe. The pustule, that was identified last week, to the left fifth toe is resolved. She will follow-up in 2 weeks 05/03/18-she is seen in follow-up evaluation for [name] left great toe ulcer. She is compliant with offloading, otherwise noncompliant with glycemic control and smoking. She has plateaued and there is minimal improvement noted. We will transition to [facility], replaced the insert to her surgical shoe and she will follow-up in one week 05/10/18- She is here in follow up evaluation for [name] left great toe ulcer. It appears stable despite measurement change. We will continue with same treatment plan and follow up next week. 05/24/18-She is seen in follow-up evaluation for [name] left great toe ulcer. She remains compliant with offloading, has made significant improvement in her diet, decreasing the amount of sugar/soda. She said her recent A1c was 10.9 which is lower than. She did see [name] diabetic nutritionist/educator yesterday. She continues to smoke. We will continue with the same treatment plan and she'll follow-up next week. 05/31/18- She is seen in  follow-up evaluation for left great toe ulcer. She continues to remain compliant with offloading, continues to make improvement in her diet, increasing her water and decreasing the amount of sugar/soda. She does continue to smoke with no desire to quit. We will apply Prisma to the depth and Hydrofera Blue over. We have not received insurance authorization for oasis. She will follow up next week. 06/07/18-She is seen in follow-up evaluation for left great toe ulcer. It has stalled according to today's measurements although base appears stable. She says she saw [name] diabetic educator yesterday; her average blood sugars are less than 300 which is an improvement for her. She continues to smoke and states "that's my next step" She continues with water over soda. We will order for xray, culture and reinstate ace wrap compression prior to placing  apligraf for next week. She is voicing no complaints or concerns. Her dressing will change to iodoflex over the next week in preparation for apligraf. 06/14/18-She is seen in follow-up evaluation for left great toe ulcer. Plain film x-ray performed last week was negative for osteomyelitis. Wound culture obtained last week grew strep B and OSSA; she is initiated on keflex and cefdinir today; there is erythema to the toe which could be from ace wrap compression, she has [name] history of wrapping too tight and has has been encouraged to maintain ace wraps that we place today. We will hold off on application of apligraf today, will apply next week after antibiotic therapy has been initiated. She admits today that she has resumed taking [name] shower with her foot/toe submerged in water, she has been reminded to keep foot/toe out of the bath water. She will be seen in follow up next week 06/21/18-she is seen in follow-up evaluation for left great toe ulcer. She is tolerating antibiotic therapy with no GI disturbance. The wound is stable. Apligraf was applied today. She has been  decreasing her smoking, only had 4 cigarettes yesterday and 1 today. She continues being more compliant in diabetic diet. She will follow-up next week for evaluation of site, if stable will remove at 2 weeks. 06/28/18- She is here in follow up evalution. Apligraf was placed last week, she states the dressing fell off on Tuesday and she was dressing with hydrofera blue. She is healed and will be discharged from the clinic today. She has been instructed to continue with smoking cessation, continue monitoring glucose levels, offloading for an additional 4 weeks and continue with hydrofera blue for additional two weeks for any possible microscopic opening. Readmission: 08/07/18 on evaluation today patient presents for reevaluation concerning the ulcer of her right great toe. She was previously discharged on 06/28/18 healed. Nonetheless she states that this began to show signs of drainage she subsequently went to her primary care provider. Subsequently an x-ray was performed on 08/01/18 which was negative. The patient was also placed on antibiotics at that time. Fortunately they should have been effective for the infection. Nonetheless she's been experiencing some improvement but still has [name] lot of drainage coming from the wound itself. 08/14/18 on evaluation today patient's wound actually does show signs of improvement in regard to the erythema at this point. She has completed the antibiotics. With that being said we did discuss the possibility of placing her in [name] total contact cast as of today although I think that I may want to give this just JADALYN, OLIVERI [name] ([id number]) [id number] Page 13 of 22 [name] little bit more time to ensure nothing recurrence as far as her infection is concerned. I do not want to put in the cast and risk infection at that time if things are not completely resolved. With that being said she is gonna require some debridement today. 08/21/18 on evaluation today  patient actually appears to be doing okay in regard to her toe ulcer. She's been tolerating the dressing changes without complication. With that being said it does appear that she is ready and in fact I think it's appropriate for [place] to go ahead and initiate the total contact cast today. Nonetheless she will require some sharp debridement to prepare the wound for application. Overall I feel like things have been progressing well but we do need to do something to get this to close more readily. 08/24/18 patient seen today for reevaluation after having had the total contact  cast applied on Tuesday. She seems to have done very well the wound appears to be doing great and overall I'm pleased with the progress that she's made. There were no abnormal areas of rubbing from the cast on her lower extremity. 08/30/18 on evaluation today patient actually appears to be completely healed in regard to her plantar toe ulcer. She tells me at this point she's been having [name] lot of issues with the cast. She almost fell [name] couple of times the state shall the step of her dog [name] couple times as well. This is been [name] very frustrating process for her other nonetheless she has completely healed the wound which is excellent news. Overall there does not appear to be the evidence of infection at this time which is great news. 09/11/18 evaluation today patient presents for follow-up concerning her great toe ulcer on the left which has unfortunately reopened since I last saw her which was only [name] couple of weeks ago. Unfortunately she was not able to get in to get the shoe and potentially the AFO that's gonna be necessary due to her left foot drop. She continues with offloading shoe but this is not enough to prevent her from reopening it appears. When we last had her in the total contact cast she did well from [name] healing standpoint but unfortunately the wound reopened as soon as she came out of the cast within just [name] couple of weeks. Right  now the biggest concern is that I do believe the foot drop is leading to the issue and this is gonna continue to be an issue unfortunately until we get things under control as far as the walking anomaly is concerned with the foot drop. This is also part of the reason why she falls on [name] regular basis. I just do not believe that is gonna be safe for [place] to reinitiate the total contact cast as last time we had this on she fell 3 times one week which is definitely not normal for her. 09/18/18 upon evaluation today the patient actually appears to be doing about the same in regard to her toe ulcer. She did not contact [employer] as I asked her to even though I had given her the prescription. In fact she actually states that she has no idea where the prescription is. She did apparently call [employer] and they told her that all she needed to do was bring the prescription in order to be able to be seen and work on getting the AFO for her left foot. With all that being said she still does not have an appointment and I'm not sure were things stand that regard. I will give her [name] new prescription today in order to contact them to get this set up. 09/25/18 on evaluation today patient actually appears to be doing about the same in regard to her toes ulcer. She does have [name] small areas which seems to have [name] lot of callous buildup around the edge of the wound which is going to need sharp debridement today. She still is waiting to be scheduled for evaluation with [employer] for possibility of an AFO. She states there supposed to call her tomorrow to get this set up. Unfortunately it does appear that her foot specifically the toe area is showing signs of erythema. There does not appear to be any systemic infection which is in these good news. 10/02/18 on evaluation today patient actually appears to be doing about the same in regard to her toe  ulcer. This really has not done too well although it's not significantly larger it's also  not significantly smaller. She has been tolerating the dressing changes without complication. She actually has her appointment with [employer] and [facility] tomorrow to hopefully be measured for obtaining and AFO splint. I think this would be helpful preventing this from reoccurring. We had contemplated starting the cast this week although to be honest I am reluctant to do that as she's been having nausea, vomiting, and seizure activity over the past three days. She has [name] history of seizures and have been told is nothing that can be done for these. With that being said I do believe that along with the seizures have the nausea vomiting which upon further questioning doesn't seem to be the normal for her and makes me concerned for the possibility of infection or something else going on. I discussed this with the patient and her mother during the office visit today. I do not feel the wound is effective but maybe something else. The responses this was "this just happens to her at times and we don't know why". They did not seem to be interested in going to the hospital to have this checked out further. 10/09/18 on evaluation today patient presents for follow-up concerning her ongoing toe ulcer. She has been tolerating the dressing changes without complication. Fortunately there does not appear to be any evidence of infection which is great news however I do think that the patient would benefit from going ahead for with the total contact cast. She's actually in [name] wheelchair today she tells me that she will use her walker if we initiate the cast. I was very specific about the fact that if we were gonna do the cast I wanted to make sure that she was using the walker in order to prevent any falls. She tells me she does not have stairs that she has to traverse on [name] regular basis at her home. She has not had any seizures since last week again that something that happens to her often she tells me she did talk to  [employer] and they said that it may take up to three weeks to get the brace approved for her. Hopefully that will not take that long but nonetheless in the meantime I do think the cast could be of benefit. 10/12/18 on evaluation today patient appears to be doing rather well in regard to her toe ulcer. It's just been [name] few days and already this is significantly improved both as far as overall appearance and size. Fortunately there's no sign of infection. She is here for her first obligatory cast change. 10/19/18 Seen today for follow up and management of left great toe ulcer. Wound continues to show improvement. Noted small open area with seroussang drainage with palpation. Denies any increased pain or recent fevers during visit. She will continue calcium alginate with offloading shoe. Denies any questions or concerns during visit. 10/26/18 on evaluation today patient appears to be doing about the same as when I last saw her in regard to her wound bed. Fortunately there does not appear to be any signs of infection. Unfortunately she continues to have [name] breakdown in regard to the toe region any time that she is not in the cast. It takes almost no time at all for this to happen. Nonetheless she still has not heard anything from the brace being made by [employer] as to when exactly this will be available to her. Fortunately there is no  signs of infection at this time. 10/30/18 on evaluation today patient presents for application of the total contact cast as we just received him this morning. Fortunately we are gonna be able to apply this to her today which is great news. She continues to have no significant pain which is good news. Overall I do feel like things have been improving while she was the cast is when she doesn't have [name] cast that things get worse. She still has not really heard anything from [employer] regarding her brace. 11/02/18 upon evaluation today patient's wound already appears to be doing  significantly better which is good news. Fortunately there does not appear to be any signs of infection also good news. Overall I do think the total contact cast as before is helping to heal this area unfortunately it's just not gonna likely keep the area closed and healed without her getting her brace at least. Again the foot drop is [name] significant issue for her. 11/09/18 on evaluation today patient appears to be doing excellent in regard to her toe ulcer which in fact is completely healed. Fortunately we finally got the situation squared away with the paperwork which was needed to proceed with getting her brace approved by Medicaid. I have filled that out unfortunately that information has been sent to the orthopedic office that I worked at 2 1/2 years ago and not tired Current wound care measures. Fortunately she seems to be doing very well at this time. 11/23/18 on evaluation today patient appears to be doing More Poorly Compared to Last Time I Saw Her. At [facility] She Had Completely Healed. Currently she is continuing to have issues with reopening. She states that she just found out that the brace was approved through Medicaid now she just has to go get measured in order to have this fitted for her and then made. Subsequently she does not have an appointment for this yet that is going to complicate things we obviously cannot put her back in the cast if we do not have everything measured because they're not gonna be able to measure her foot while she is in the cast. 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 Obviously this is unfortunate and does have me somewhat worried as well. 11/30/18 on evaluation today patient's toe ulcer actually appears to be doing fairly well. The good news is she will be getting her brace in the shoes next week on Wednesday. Hopefully we will be able to get this to heal  without having to go back in the cast however she may need the cast in order to get the wound completely heal and then go from there. Fortunately there's no signs of infection at this time. 12/07/18 on evaluation today patient fortunately did receive her brace and she states she could tell this definitely makes her walk better. With that being said she's been having issues with her toe where she noticed yesterday there was [name] lot of tissue that was loosing off this appears to be much larger than what it was previous. She also states that her leg has been read putting much across the top of her foot just about the ankle although this seems to be receiving YARIXA, LIGHTCAP [name] ([id number]) [phone].pdf Page 14 of 22 somewhat. The total area is still red and appears to be someone infected as best I can tell. She is previously  taken Bactrim and that may be [name] good option for her today as well. We are gonna see what I wound culture shows as well and I think that this is definitely appropriate. With that being said outside of the culture I still need to initiate something in the interim and that's what I'm gonna go ahead and select Bactrim is [name] good option for her. 12/14/18 on evaluation today patient appears to be doing better in regard to her left great toe ulcer as compared to last week's evaluation. There's still some erythema although this is significantly improved which is excellent news. Overall I do believe that she is making good progress is still gonna take some time before she is where I would like her to be from the standpoint of being able to place her back into the total contact cast. Hopefully we will be where we need to be by next week. 12/21/18 on evaluation today patient actually appears to be doing poorly in regard to her toe ulcer. She's been tolerating the dressing changes without complication. Fortunately there's no signs of systemic infection although she does have [name]  lot of drainage from the toe ulcer and this does seem to be causing some issues at this point. She does have erythema on the distal portion of her toe that appears to be likely cellulitis. 12/28/18 on evaluation today patient actually appears to be doing [name] little better in my pinion in regard to her toe ulcer. With that being said she still does have some evidence of infection at this time and for her culture she had both E. coli as well as enterococcus as organisms noted on evaluation. For that reason I think that though the Keflex likely has treated the E. coli rather well this has really done nothing for the enterococcus. We are going to have to initiate treatment for this specifically. 01/04/19 on evaluation today patient's toe actually appears to be doing better from the standpoint of infection. She currently would like to see about putting the cash back on I think that this is appropriate as long as she takes care of it and keeps it from getting wet. She is gonna have some drainage we can definitely pass this up with Drawtex and alginate to try to prevent as much drainage as possible from causing the problems. With that being said I do want to at least try her with the cast between now and Tuesday. If there any issues we can't continue to use it then I will discontinue the use of the cast at that point. 01/08/19 on evaluation today patient actually appears to be doing very well as far as her foot ulcer specifically the great toe on the left is concerned. She did have an area of rubbing on the medial aspect of her left ankle which again is from the cast. Fortunately there's no signs of infection at this point in this appears to be [name] very slight skin breakdown. The patient tells me she felt it rubbing but didn't think it was that bad. Fortunately there is no signs of active infection at this time which is good news. No fevers, chills, nausea, or vomiting noted at this time. 01/15/19 on evaluation today  patient actually appears to be doing well in regard to her toe ulcer. Again as previous she seems to do well and she has the cast on which indicates to me that during the time she doesn't have [name] cast on she's putting way too much pressure on  this region. Obviously I think that's gonna be an issue as with the current national emergency concerning the Covid-19 Virus it has been recommended that we discontinue the use of total contact casting by the chief medical officer of our company, Dr. Maurine Minister. The reasoning is that if [name] patient becomes sick and cannot come into have the cast removed they could not just leave this on for an additional two weeks. Obviously the hospitals also do not want to receive patient's who are sick into the emergency department to potentially contaminate the region and spread the Covid-19 Virus among other sick individuals within the hospital system. Therefore at this point we are suspending the use of total contact cast until the current emergency subsides. This was all discussed with the patient today as well. 01/22/19 on evaluation today patient's wound on her left great toe appears to be doing slightly worse than previously noted last week. She tells me that she has been on this quite [name] bit in fact she tells me she's been awake for 38 straight hours. This is due to the fact that she's having to care for grandparents because nobody else will. She has been taking care of them for five the last seven days since I've seen her they both have dementia his is from [name] stroke and her grandmother's was progressive. Nonetheless she states even her mom who knows her condition and situation has only help two of those days to take care of them she's been taking care of the rest. Fortunately there does not appear to be any signs of active infection in regard to her toe at this point although obviously it doesn't look as good as it did previous. I think this is directly related to her not taking  off the pressure and friction by way of taking things easy. Though I completely understand what's going on. 01/29/19 on evaluation today patient's tools are actually appears to be showing some signs of improvement today compared to last week's evaluation as far as not necessarily the overall size of the wound but the fact that she has some new skin growth in between the two ends of the wound opening. Overall I feel like she has done well she states that she had [name] family member give her what sounds to be [name] CAM walker boot which has been helpful as well. 02/05/19 on evaluation today patient's wound bed actually appears to be doing significantly better in regard to her overall appearance of the size of the wound. With that being said she is still having an issue with offloading efficiently enough to get this to close. Apparently there is some signs of infection at this point as well unfortunately. Previously she's done well of Augmentin I really do not see anything that needs to be culture currently but there theme and cellulitis of the foot that I'm seeing I'm gonna go ahead and place her on an antibiotic today to try to help clear this up. 02/12/2019 on evaluation today patient actually appears to be doing poorly in regard to her overall wound status. She tells me she has been using her offloading shoe but actually comes in today wearing her tennis shoe with the AFO brace. Again as I previously discussed with her this is really not sufficient to allow the area to heal appropriately. Nonetheless she continues to be somewhat noncompliant and I do wonder based on what she has told my nurse in the past as to whether or not [name] good portion of  this noncompliance may be recreational drug and alcohol related. She has had [name] history of heroin overdose and this was fairly recently in the past couple of months that have been seeing her. Nonetheless overall I feel like her wound looks significantly worse today compared to  what it was previous. She still has significant erythema despite the Augmentin I am not sure that this is an appropriate medication for her infection I am also concerned that the infection may have gone down into her bone. 02/19/19 on evaluation today patient actually appears to be doing about the same in regard to her toe ulcer. Unfortunately she continues to show signs of bone exposure and infection at this point. There does not appear to be any evidence of worsening of the infection but I'm also not really sure that it's getting significantly better. She is on the Augmentin which should be sufficient for the Staphylococcus aureus infection that she has at this point. With that being said she may need IV antibiotics to more appropriately treat this. We did have [name] discussion today about hyperbaric option therapy. 02/28/19 on evaluation today patient actually appears to be doing much worse in regard to the wound on her left great toe as compared to even my previous evaluation last week. Unfortunately this seems to be training in [name] pretty poor direction. Her toe was actually now starting to angle laterally and I can actually see the entire joint area of the proximal portion of the digit where is the distal portion of the digit again is no longer even in contact with the joint line. Unfortunately there's [name] lot more necrotic tissue around the edge and the toe appears to be showing signs of becoming gangrenous in my pinion. I'm very concerned about were things stand at this point. She did see infectious disease and they are planning to send in [name] prescription for Sivextro for her and apparently this has been approved. With that being said I don't think she should avoid taking this but at the same time I'm not sure that it's gonna be sufficient to save her toe at this point. She tells me that she still having to care for grandparents which I think is putting quite [name] bit of strain on her foot and  specifically the total area and has caused this to break down even to [name] greater degree than would've otherwise been expected. 03/05/19 on evaluation today patient actually appears to be doing quite well in regard to her toe all things considering. She still has bone exposed but there appears to be much less your thing on overall the appearance of the wound and the toe itself is dramatically improved. She still does have some issues currently obviously with infection she did see vascular as well and there concerned that her blood flow to the toad. For that reason they are setting up for an angiogram next week. 03/14/19 on evaluation today patient appears to be doing very poor in regard to her toe and specifically in regard to the ulceration and the fact that she's starting to notice the toe was leaning even more towards the lateral aspect and the complete joint is visible on the proximal aspect of the joint. Nonetheless she's also noted [name] significant odor and the tip of the toe is turning more dark and necrotic appearing. Overall I think she is getting worse not better as far as this is concerned. For that reason I am recommending at this point that she likely needs to be  seen for likely amputation. READMISSION 03/19/2021 This is [name] patient that we cared for in this clinic for [name] prolonged period of time in 2019 and 2020 with [name] left foot and left first toe wound. 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 Excell Seltzer. In December 2021 she had an ulcer on her right great toe as well as the fourth and fifth toes. 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 She also had an angiogram at that time and underwent angioplasty of the right anterior tibial artery. In any case she claims that the wound on the right DEISY, OZBUN [name] ([id number])  [id number] Page 15 of 22 foot is closed I did not look at this today which was probably an oversight although I think that should be done next week. 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 According to Dr. Bernette Redbird last review that she was out of the area being cared for by another physician but recently came back to his attention. 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 [name] culture of this area showed E. coli apparently before she came back to see Dr. Excell Seltzer she was supposed to be receiving antibiotics but she did not really take them. Nor is she offloading this area at all. Finally her last hemoglobin A1c listed in epic was in March 2022 at 14.1 she says things are [name] lot better since then although I am not sure. 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 She had this wound at the time. During this admission she had E. coli sepsis [name] TEE was negative. 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 She is still [name] heavy cigarette smoker. She has [name] history of bipolar disorder seizure disorder ABI in our clinic was 1.05 6/1; left midfoot in the setting of [name] TMA done previously. Round circular wound with [name] "knuckle" of protruding tissue. The problem is that the knuckle was not attached to any of the surrounding granulation and this probed proximally widely I removed [name] large portion of this tissue. This wound goes with considerable undermining laterally. I do not feel any bone there was no purulence but this is [name] deep wound. 6/8; in spite of the debridement I did last week. She arrives  with [name] wound looking exactly the same. [name] protruding "knuckle" of tissue nonadherent to most of the surrounding tissue. There is considerable depth around this from 6-12 o'clock at 2.7 cm and undermining of 1 cm. This does not look overtly infected and the x- ray I did last week was negative for any osseous abnormalities. We have been using silver collagen 6/15; deep tissue culture I did last week showed moderate staph aureus and moderate Pseudomonas. This will definitely require prolonged antibiotic therapy. The pathology on the protuberant area was negative for malignancy fungus etc. 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 We have been using silver collagen. I am going to be prescribing Levaquin for 2 weeks. Her CT scan of the foot is down for  7/5 6/22; CT scan of the foot on 7 5. She says she has hardware in the left leg from her previous fracture. She is on the Levaquin for the deep tissue culture I did that showed methicillin sensitive staph aureus and Pseudomonas. I gave her [name] 2-week supply and she will have another week. She arrives in clinic today with the same protuberant tissue however this is nonadherent to the tissue surrounding it. I am really at [name] loss to explain this unless there is underlying deep tissue infection 6/29; patient presents for 1 week follow-up. She has been using collagen to the wound bed. She reports taking her antibiotics as prescribed.She has no complaints or issues today. She denies signs of infection. 7/6; patient presents for one week followup. She has been using collagen to the wound bed. She states she is taking Levaquin however at times she is not able to keep it down. She denies signs of infection. 7/13; patient presents for 1 week follow-up. She has been using silver alginate to the wound bed. She still has nausea when taking her antibiotics. She denies signs of infection. 7/20; patient presents for  1 week follow-up. She has been using silver alginate with gentamicin cream to the wound bed. She denies any issues and has no complaints today. She denies signs of infection. 7/27; patient presents for 1 week follow-up. She continues to use silver alginate with gentamicin cream to the wound bed. She reports starting her antibiotics. She has no issues or complaints. Overall she reports stability to the wound. 8/3; patient presents for 1 week follow-up. She has been using silver alginate with gentamicin cream to the wound bed. She reports completing all antibiotics. She has no issues or complaints today. She denies signs of infection. 8/17; patient presents for 2-week follow-up. He is to use silver alginate to the wound bed. She has no issues or complaints today. She denies signs of infection. She reports her pain has improved in her foot since last clinic visit 8/24; patient presents for 1 week follow-up. She continues to use silver alginate to the wound bed. She has no issues or complaints. She denies signs of infection. Pain is stable. 9/7; patient presents for follow-up. She missed her last week appointment due to feeling ill. She continues to use silver alginate. She has [name] new wound to the right lower extremity that is covered in eschar. She states It occurred over the past week and has no idea how it started. She currently denies signs of infection. 9/14; patient presents for follow-up. T the left foot wound she has been using gentamicin cream and silver alginate. T the right lower extremity wound she has o o been keeping this covered and has not obtain Santyl. 9/21; patient presents for follow-up. She reports using gentamicin cream and silver alginate to the left foot and Santyl to the right lower extremity wound. She has no issues or complaints today. She denies signs of infection. 9/28; patient presents for follow-up. She reports [name] new wound to her right heel. She states this occurred [name] few  days ago and is progressively gotten worse. She has been trying to clean the area with [name] Q-tip and Santyl. She reports stability in the other 2 wounds. She has been using gentamicin cream and silver alginate to the left foot and Santyl to the right lower extremity wound. 10/12; patient presents for follow-up. She reports improvement to the wound beds. She is seeing vein and vascular to discuss the potential of [name]  left BKA. She states they are going to do an arteriogram. She continues to use silver alginate with dressing changes to her wounds. 11/2; patient presents for follow-up. She states she has not been doing dressing changes to the wound beds. She states she is not able to offload the areas. She reports chronic pain to her left foot wound. 11/9; patient presents for follow-up. She came in with only socks on. She states she forgot to put on shoes. It is unclear if she is doing any dressing changes. She currently denies systemic signs of infection. 11/16; patient presents for follow-up. She came again only with socks on. She states she does not wear shoes ever. It is unclear if she does dressing changes. She currently denies systemic signs of infection. 11/23; patient presents for follow-up. She wore her shoes today. It still unclear exactly what dressing she is using for each wound but she did states she obtained Dakin's solution and has been using this to the left foot wound. She currently denies signs of infection. 11/30; patient presents for follow-up. She has no issues or complaints today. She currently denies signs of infection. 12/7; patient presents for follow-up. She has no issues or complaints today. She has been using Hydrofera Blue to the right heel wound and Dakin solution to the left foot wound. Her right anterior leg wound is healed. She currently denies signs of infection. 12/14; patient presents for follow-up. She has been using Hydrofera Blue to the right heel and Dakin's to the  left foot wounds. She has no issues or complaints today. She denies signs of infection. 12/21; patient presents for follow-up. She reports using Hydrofera Blue to the right heel and Dakin's to the left foot wound. She denies signs of infection. 12/28; patient presents for follow-up. She continues to use Dakin's to the left foot wound and Hydrofera Blue to the right heel wound. She denies signs of infection. 1/4; patient presents for follow-up. She has no issues or complaints today. She denies signs of infection. 1/11; patient presents for follow-up. It is unclear if she has been dressing these wounds over the past week. She currently denies signs of infection. 1/18; patient presents for follow-up. She states she has been using Dakin's wet-to-dry dressings to the left foot. She has been using Hydrofera Blue to the right foot foot wound. She states that the anterior right leg wound has reopened and draining serous fluid. She denies signs of infection. 1/25; patient presents for follow-up. She has no issues or complaints today. [name], [name] ([id number]) [id number] Page 16 of 22 2/1; patient presents for follow-up. She has no issues or complaints today. She denies signs of infection. 2/8; patient presents for follow-up. She has lost her surgical shoes. She did not have [name] dressing to the right heel wound. She currently denies signs of infection. 2/15; patient presents for follow-up. She reports more pain to the right heel today. She denies purulent drainage Or fever/chills 2/22; patient presents for follow-up. She reports taking clindamycin over the past week. She states that she continues to have pain to her right heel. She reports purulent drainage. Readmission 03/16/2022 Ms. Darice Vicario is [name] 47 year old female with [name] past medical history of type 2 diabetes, osteomyelitis to her feet, chronic systolic heart failure and bipolar disorder that presents to the clinic for  bilateral feet wounds and right lower extremity wound. She was last seen in our clinic on 12/15/2021. At that time she had purulent drainage coming out of her right  plantar foot and I recommended she go to the ED. She states she went to [facility] and has been there for the past 3 months. I cannot see the records. She states she had OR debridement and was on several weeks of IV antibiotics while inpatient. Since discharge she has not been taking care of the wound beds. She had nothing on her feet other than socks today. She currently denies signs of infection. 5/31; patient presents for follow-up. She has been using Dakin's wet-to-dry dressings to the wound beds on her feet bilaterally and antibiotic ointment to the right anterior leg wound. She had [name] wound culture done at last clinic visit that showed moderate Pseudomonas aeruginosa sensitive to ciprofloxacin. She currently denies systemic signs of infection. 6/14; patient presents for follow-up. She received Keystone 5 days ago and has been using this on the wound beds. She states that last week she had to go to the hospital because she had increased warmth and erythema to the right foot. She was started on 2 oral antibiotics. She states she has been taking these. She currently denies systemic signs of infection. She has no issues or complaints today. 6/21; patient presents for follow-up. She states she has been using Keystone antibiotics to the wound beds. She has no issues or complaints today. She denies signs of infection. 6/28; patient presents for follow-up. She has been using Keystone antibiotics to the wound beds. She has no issues or complaints today. 7/12; patient presents for follow-up. Has been using Keystone antibiotics to the wound beds with calcium alginate. She has no issues or complaints today. She never followed up with her orthopedic surgeon who did the OR debridement to the right foot. We discussed the total contact  cast for the left foot and patient would like to do this next week. 7/19; patient presents for follow-up. She has been using Keystone antibiotics with calcium alginate to the wound beds. She has no issues or complaints today. Patient is in agreement to do the total contact cast of the left foot today. She knows to return later this week for the obligatory cast change. 05-13-2022 upon evaluation today patient's wound which she has the cast of the left leg actually appears to be doing significantly better. Fortunately I do not see any signs of active infection locally or systemically which is great news and overall I am extremely pleased with where we stand currently. 7/26; patient presents for follow-up. She has [name] cast in place for the past week. She states it irritated her shin. Other than that she tolerated the cast well. She states she would like [name] break for 1 week from the cast. We have been using Keystone antibiotic and Aquacel to both wound beds. She denies signs of infection. 8/2; patient presents for follow-up. She has been using Keystone and Aquacel to the wound beds. She denies any issues and has no complaints. She is agreeable to have the cast placed today for the left leg. 06-03-2022 upon evaluation today patient appears to be doing well with regard to her wound she saw some signs of improvement which is great news. Fortunately I do not see any evidence of active infection locally or systemically at this time which is great news. No fevers, chills, nausea, vomiting, or diarrhea. 8/16; patient presents for follow-up. She has no issues or complaints today. We have been using Keystone and Aquacel to the wound beds. The left lower extremity is in [name] total contact cast. She is tolerated this  well. 8/23; patient presents for follow-up. She has had the total contact cast on the left leg for the past week. Unfortunately this has rubbed and broken down the skin to the medial foot. She currently  denies signs of infection. She has been using Keystone antibiotic to the right plantar foot wound. 8/30; patient presents for follow-up. We have held off on the total contact cast for the left leg for the past week. Her wound on the left foot has improved and the previous surrounding breakdown of skin has epithelialized. She has been using Keystone antibiotic to both wound beds. She has no issues or complaints today. She denies signs of infection. 9/6; patient presents for follow-up. She has ordered her's Keystone antibiotic and this is arriving this week. She has been doing Dakin's wet-to-dry dressings to the wound beds. She denies signs of infection. She is agreeable to the total contact cast today. 9/13; patient presents for follow-up. She states that the cast caused her left leg shin to be sore. She would like to take [name] break from the cast this week. She has been using Keystone antibiotic to the right plantar foot wound. She denies signs of infection. 9/20; patient presents for follow-up. She has been using Keystone antibiotics to the wound beds with calcium alginate to the right foot wound and Hydrofera Blue to the left foot wound. She is agreeable to having the cast placed today. She has been approved for Apligraf and we will order this for next clinic visit. 9/27; patient presents for follow-up. We have been using Keystone antibiotic with Hydrofera Blue to the left foot wound under [name] total contact cast. T the right o foot wound she has been using Keystone antibiotic and calcium alginate. She declines [name] total contact cast today. Apligraf is available for placement and she would like to proceed with this. 07-28-2022 upon evaluation today patient appears to be doing well currently in regard to her wound. She is actually showing signs of significant improvement which is great news. Fortunately I do not see any evidence of active infection locally nor systemically at this time. She has been seeing  Dr. Mikey Bussing and to be honest has been doing very well with the cast. Subsequently she comes in today with [name] cast on and we did reapply that today as well. She did not really want to she try to talk me out of that but I explained that if she wanted to heal this is really the right way to go. Patient voiced understanding. In regard to her right foot this is actually [name] lot better compared to the last time I saw her which is also great news. 10/11; patient presents for follow-up. Apligraf and the total contact cast was placed to the left leg at last clinic visit. She states that her right foot wound had burning pain to it with the placement of Apligraf to this area. She has been doing [place] over this area. She denies signs of infection including increased warmth, erythema or purulent drainage. 11/1; 3-week follow-up. The patient fortunately did not have [name] total contact cast or an Apligraf and on the left foot. She has been using Keystone ABD pads and kerlix and her own running shoes She arrives in clinic today with thick callus and [name] very poor surface on the left foot on the right nonviable skin subcutaneous tissue and [name] deep probing hole. 11/15; patient missed her last clinic appointment. She states she has not been dressing the wound beds for the  past 2 weeks. She states that at she had [name] new roommate but is now going back to live with her mother. Apparently its been [name] distracting 2 weeks. Patient currently denies signs of infection. LEVINA, BOYACK [name] ([id number]) [id number] Page 17 of 22 11/22; patient presents for follow-up. She states she has been using Keystone antibiotic and Dakin's wet-to-dry dressings to the wound beds. She is agreeable for cast placement today. We had ordered Apligraf however this has not been received by our facility. 11/29; Patient had [name] total contact cast placed at last clinic visit and she tolerated this well. We were using silver alginate under  the cast. Patient's been using Keystone antibiotic with Aquacel to the right plantar foot wound. She has no issues or complaints today. Apligraf is available for placement today. Patient would like to proceed with this. 12/6; patient presents for follow-up. She had Apligraf placed in standard fashion last clinic visit under the total contact cast to the left lower extremity. She has been using Keystone antibiotic and Aquacel to the right plantar foot wound. She has no issues or complaints today. 12/13; patient presents for follow-up. She has finished 5 Apligraf placements. Was told she would not qualify for more. We have been doing [name] total contact cast to the left lower extremity. She has been using Keystone antibiotic and Aquacel to the right plantar foot wound. She has no issues or complaints today. 12/20; patient presents for follow-up. We have been using Hydrofera Blue with Keystone antibiotic under [name] total contact cast of the left lower extremity. She reports using Keystone antibiotic and silver alginate to the right heel wound. She has no issues or complaints today. 12/27; patient presents with [name] healthy wound on the left midfoot. We have Apligraf to apply that to that more also using [name] total contact cast. On the right we are using Keystone and silver alginate. She is offloading the right heel with [name] surgical shoealthough by her admission she is on her feet quite [name] bit 1/3; patient presents for follow-up. Apligraf was placed to the wound beds last clinic visit. She was placed in [name] total contact cast to the left lower extremity. She declines [name] total contact cast today. She states that her mother is in the hospital and she cannot adequately get around with the cast on. 1/10; patient presents for follow-up. She declined the total contact cast at last clinic visit. Both wounds have declined in appearance. She states that she has been on her feet and not offloading the wound beds. She currently denies  signs of infection. 1/17; patient presents for follow-up. She had the total contact cast along with Apligraf placed last week to the left lower extremity. She tolerated this well. She has been using Aquacel Ag and Keystone antibiotic to the right heel wound. She currently denies signs of infection. She has no issues or complaints today. 1/24; patient presents for follow-up. We have been using Apligraf to the left foot wound along with [name] total contact cast. She has done well with this. T the right o heel wound she has been using Aquacel Ag and Keystone antibiotic ointment. She has no issues or complaints today. She denies signs of infection. 1/31; patient presents for follow-up. We have been using Apligraf to the left foot wound along with the total contact cast. She continues to do well with this. To the right heel we have been using Aquacel Ag and Keystone antibiotic ointment. She has no issues or complaints today. 12-01-2022 upon  evaluation patient is seen today on my schedule due to the fact that she unfortunately was in the hospital yesterday. Her cast needed to come off the only reason she is out of the hospital is due to the fact that they would not take it off at the hospital which is somewhat bewildered reading to me to be perfectly honest. I am not certain why this was but either way she was released and then was placed on my schedule today in order to get this off and reapply the total contact casting as appropriate. I do not have an Apligraf for her today it was applied last week and today's actually expired yesterday as there was some scheduling conflicts with her being in the hospital. Nonetheless we do not have that for reapplication today but the good news that she is not draining too much and the Apligraf can go for up to 2 weeks so I am going to go ahead and reapply the total contact casting but we are going to leave the Apligraf in place. 2/14; patient presents for follow-up. T the left  leg she has had the total contact cast and Apligraf for the past week. She has had no issues with this. T the o o right heel she has been using antibiotic ointment and Aquacel Ag. 2/21; patient presents for follow-up. We have been using Apligraf and [name] total contact cast to the left lower extremity. She is tolerated this well. Unfortunately she is not approved for any more Apligraf per insurance. She has been using antibiotic ointment and Aquacel Ag to the right foot. She has no issues or complaints today. 2/28; patient presents for follow-up. We have been using Hydrofera Blue and antibiotic ointment under the total contact cast to the left lower extremity. He has been using Aquacel Ag with antibiotic ointment to the right plantar foot. She has no issues or complaints today. 3/6; patient presents for follow-up. She did not obtain her gentamicin ointment. She has been using Aquacel Ag to the right plantar foot wound. We have been using Hydrofera Blue with antibiotic ointment under the total contact cast to the left lower extremity. She has no issues or complaints today. 3/12; patient presents for follow-up. She has been using gentamicin ointment and Aquacel Ag to the right plantar foot wound. We have been using Hydrofera Blue with antibiotic ointment under the total contact cast on the left lower extremity foot wound. She has no issues or complaints today. 3/20; patient presents for follow-up. She has been using gentamicin ointment and Aquacel Ag to the right plantar foot wound. We have been using Hydrofera Blue with antibiotic ointment under the total contact cast to the left lower extremity wound. She has no issues or complaints today. 3/27; patient presents for follow-up. We have been using antibiotic ointment and Aquacel Ag to the right plantar foot wound. We have been using Hydrofera Blue with antibiotic ointment under the total contact cast to the left lower extremity. Both wounds are  smaller. 01-24-2023 upon evaluation today patient actually appears to be doing better compared to last time I saw her. It has been several weeks since I last saw her but nonetheless I think the total contact casting is doing [name] good job. Fortunately I do not see any evidence of infection or worsening overall which is great news and in general I do believe that she is moving in the appropriate direction. It has been [name] very long road but nonetheless I feel like she is  nearing the end at least in regard to the left foot and even the right foot looks much better to be perfectly honest. 01-31-2023 upon evaluation today patient appears to be doing well currently in regard to her wounds. She has been require some sharp debridement which I think is probably bone both sides to remove [name] lot of callus that has built up at this point. I discussed that with her today. Also think total contact cast is doing well for the left foot Emina continue that as well. 4/17; patient presents for follow-up. We have been doing Hydrofera Blue with antibiotic ointment under the total contact cast to the left lower extremity and Aquacel Ag with antibiotic ointment to the right foot wound. She has been using her offloading heel shoe here. 4/24; patient presents for follow-up. We have been doing Hydrofera Blue with antibiotic ointment under the total contact cast to the left lower extremity and Aquacel Ag with antibiotic ointment to the right foot wound. She has been using her offloading heel shoe here. Wounds are smaller with slough accumulation. 5/1; patient presents for follow-up. We have been doing Hydrofera Blue with antibiotic ointment under the total contact cast to the left lower extremity and Aquacel Ag with antibiotic ointment to the right foot wound. Left foot wound is stable and right foot wound appears smaller. 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 On the left she is using Hydrofera Blue topical antibiotic under [name] total  contact cast, on the right plantar heel gentamicin Aquacel Ag. According to our intake nurse both are making slow but steady improvements. 5/15; patient presents for follow-up. We have been doing Hydrofera Blue with antibiotic ointment under the total contact cast on the left and antibiotic ointment with Aquacel Ag on the right. Unfortunately the left foot wound has declined in size and appearance. We also do not have the total contact cast available in office today. Patient denies signs of infection. 5/22; patient presents for follow-up. We have been doing Hydrofera Blue and antibiotic ointment to the left foot and Aquacel Ag with antibiotic ointment to the right foot. We took [name] break from the cast since we did not have it placed at last week. Wounds look stable if not slightly improved. MOXIE, KALIL [name] ([id number]) [id number] Page 4 of 22 03-27-2023 patient presents today she has had this For Most 2 Weeks Because She Was in the Hospital and They Did Not T It off. Again That Is Really Not ake Something That She Could Control but Nonetheless We Are Seeing Her at This Point Today for Reevaluation. We Do Want to Go and See about Reapply the Cast. Again We Had All Set Everything up to Go Ahead and Do This When We Went to Get the Cast Only to Realize That Not Had Come in and We Were out at the Moment. With That Being [place] We Will Get Have T Get the Exam before We Can Reapply the T Contact Casting Going Forward. o otal 6/12; patient presents for follow-up. She has been using Hydrofera Blue to the left foot wound and Aquacel Ag to the right foot wound. No cast was available last week placed on the patient so she has been offloading with her surgical shoe. She has no issues or complaints today. 6/19; patient presents for follow-up. We have been using Hydrofera Blue to the left foot wound under the total contact cast and Aquacel Ag to the right wound. She has no issues or  complaints today. The wounds  are smaller. Objective Constitutional Vitals Time Taken: 11:16 AM, Height: 69 in, Weight: 178 lbs, BMI: 26.3, Temperature: 98.8 F, Pulse: 60 bpm, Respiratory Rate: 16 breaths/min, Blood Pressure: 104/56 mmHg. General Notes: Right foot: T the plantar heel there is an incision site with increased depth, No probing to bone. T the opening there is Skin and granulation o o tissue Left foot: T the medial aspect there is an open wound with granulation tissue, Callus and slough build up. No overt signs of infection to any of the o wound beds. Integumentary (Hair, Skin) Wound #11 status is Open. Original cause of wound was Surgical Injury. The date acquired was: 12/01/2021. The wound has been in treatment 56 weeks. The wound is located on the Right,Plantar Foot. The wound measures 0.1cm length x 0.1cm width x 0.4cm depth; 0.008cm^2 area and 0.003cm^3 volume. There is Fat Layer (Subcutaneous Tissue) exposed. There is [name] small amount of serous drainage noted. The wound margin is distinct with the outline attached to the wound base. There is small (1-33%) pink, pale granulation within the wound bed. There is [name] large (67-100%) amount of necrotic tissue within the wound bed including Adherent Slough. Wound #12 status is Open. Original cause of wound was Pressure Injury. The date acquired was: 03/16/2020. The wound has been in treatment 56 weeks. The wound is located on the Left,Medial,Plantar Foot. The wound measures 0.8cm length x 0.6cm width x 0.1cm depth; 0.377cm^2 area and 0.038cm^3 volume. There is Fat Layer (Subcutaneous Tissue) exposed. There is [name] medium amount of serosanguineous drainage noted. The wound margin is flat and intact. There is large (67-100%) pink, pale granulation within the wound bed. There is [name] small (1-33%) amount of necrotic tissue within the wound bed including Adherent Slough. 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 I debrided nonviable tissue. I recommended continuing the course with Hydrofera Blue under the total contact cast of the left foot and Aquacel Ag to the right foot. Continue aggressive offloading on the right foot. Follow-up in 1 week. Procedures Wound #12 Pre-procedure diagnosis of Wound #12 is [name] Diabetic Wound/Ulcer of the Lower Extremity located on the Left,Medial,Plantar Foot .Severity of Tissue Pre Debridement is: Fat layer exposed. There was [name] Selective/Open Wound Skin/Epidermis Debridement with [name] total area of 0.38 sq cm performed by Geralyn Corwin, MD. With the following instrument(s): Curette to remove Viable and Non-Viable tissue/material. Material removed includes Callus, Slough, Skin: Dermis, and Skin: Epidermis. [name] time out was conducted at 11:44, prior to the start of the procedure. [name] Minimum amount of bleeding was controlled with Pressure. The procedure was tolerated well. Post Debridement Measurements: 0.8cm length x 0.6cm width x 0.1cm depth; 0.038cm^3 volume. Character of Wound/Ulcer Post Debridement is improved. Severity of Tissue Post Debridement is: Fat layer exposed. Post procedure Diagnosis Wound #12: Same as Pre-Procedure Pre-procedure diagnosis of Wound #12 is [name] Diabetic Wound/Ulcer of the Lower Extremity located on the Left,Medial,Plantar Foot . There was [name] T Contact otal Cast Procedure by Geralyn Corwin, MD. Post procedure Diagnosis Wound #12: Same as Pre-Procedure ELIAS, BORDNER [name] ([id number]) [phone].[id number] Page 19 of 22 Plan Follow-up Appointments: Return Appointment  in  1 week. Nurse Visit as needed Bathing/ Shower/ Hygiene: May shower with wound dressing protected with water repellent cover or cast protector. No tub bath. Anesthetic (Use 'Patient Medications' Section for Anesthetic Order Entry): Lidocaine applied to wound bed Edema Control - Lymphedema / Segmental Compressive Device / Other: Elevate, Exercise Daily and Avoid Standing for Long Periods of Time. Elevate legs to the level of the heart and pump ankles as often as possible Elevate leg(s) parallel to the floor when sitting. Off-Loading: T Contact Cast to Left Lower Extremity - tcc applied left leg otal Open toe surgical shoe - Heel off loader - Right foot- Other: - keep pressure off of feet. Additional Orders / Instructions: Follow Nutritious Diet and Increase Protein Intake Other: - Stay off of foot, wheelchair only WOUND #11: - Foot Wound Laterality: Plantar, Right Cleanser: Wound Cleanser 3 x Per Week/30 Days Discharge Instructions: Wash your hands with soap and water. Remove old dressing, discard into plastic bag and place into trash. Cleanse the wound with Wound Cleanser prior to applying [name] clean dressing using gauze sponges, not tissues or cotton balls. Do not scrub or use excessive force. Pat dry using gauze sponges, not tissue or cotton balls. Prim Dressing: Aquacel Extra Hydrofiber Dressing, 2x2 (in/in) 3 x Per Week/30 Days ary Secondary Dressing: ABD Pad 5x9 (in/in) (Generic) 3 x Per Week/30 Days Discharge Instructions: Cover with ABD pad Secured With: Medipore T - 71M Medipore H Soft Cloth Surgical T ape ape, 2x2 (in/yd) 3 x Per Week/30 Days Secured With: [employer] or Non-Sterile 6-ply 4.5x4 (yd/yd) 3 x Per Week/30 Days Discharge Instructions: Apply Kerlix as directed WOUND #12: - Foot Wound Laterality: Plantar, Left, Medial Cleanser: Wound Cleanser 1 x Per Week/30 Days Discharge Instructions: Wash your hands with soap and water. Remove old dressing, discard into  plastic bag and place into trash. Cleanse the wound with Wound Cleanser prior to applying [name] clean dressing using gauze sponges, not tissues or cotton balls. Do not scrub or use excessive force. Pat dry using gauze sponges, not tissue or cotton balls. Prim Dressing: Hydrofera Blue Ready Transfer Foam, 2.5x2.5 (in/in) 1 x Per Week/30 Days ary Discharge Instructions: Apply Hydrofera Blue Ready to wound bed as directed Secondary Dressing: heel cup 1 x Per Week/30 Days Discharge Instructions: x2 for heel and front of foot Secured With: Medipore T - 71M Medipore H Soft Cloth Surgical T ape ape, 2x2 (in/yd) 1 x Per Week/30 Days Secured With: [employer] Sterile or Non-Sterile 6-ply 4.5x4 (yd/yd) 1 x Per Week/30 Days Discharge Instructions: Apply Kerlix as directed 1. In office sharp debridement 2. T contact cast placed in standard fashion to the left lower extremity otal 3. Hydrofera Blue 4. Aquacel Ag 5. Follow-up in 1 week Electronic Signature(s) Signed: 04/13/2023 12:49:28 PM By: Geralyn Corwin DO Entered By: Geralyn Corwin on 04/12/2023 12:12:43 -------------------------------------------------------------------------------- ROS/PFSH Details Patient Name: Date of Service: [name], Aubriel [name]. 04/12/2023 11:00 [name]: [id number] Patient Account Number: [id number] Date of Birth/Sex: Treating RN: 1976-09-30 (47 y.o. Female) Huel Coventry Primary Care Provider: [name] Other Clinician: Referring Provider: Treating Provider/Extender: Chapman Moss in Treatment: [address] Information Obtained From [name] ([id number]) [id number] Page 20 of 22 Patient Ear/Nose/Mouth/Throat Medical History: Positive for: Chronic sinus problems/congestion; Middle ear problems Hematologic/Lymphatic Medical History: Positive for: Anemia Respiratory Medical History: Positive for: Chronic Obstructive Pulmonary Disease  (COPD) Cardiovascular Medical History: Positive for: Congestive Heart Failure - EF 33% Endocrine Medical History: Positive for:  Type II Diabetes Time with diabetes: 14 years Treated with: Insulin, Oral agents Blood sugar tested every day: No Blood sugar testing results: Bedtime: 176 Genitourinary Medical History: Positive for: End Stage Renal Disease Integumentary (Skin) Medical History: Positive for: History of pressure wounds Neurologic Medical History: Positive for: Neuropathy - feel and legs HBO Extended History Items Ear/Nose/Mouth/Throat: Ear/Nose/Mouth/Throat: Chronic sinus Middle ear problems problems/congestion Immunizations Pneumococcal Vaccine: Received Pneumococcal Vaccination: No Implantable Devices No devices added Family and Social History Current every day smoker; Marital Status - Single; Alcohol Use: Never; Drug Use: Prior History; Caffeine Use: Daily Electronic Signature(s) Signed: 04/13/2023 12:49:28 PM By: Geralyn Corwin DO Signed: 04/17/2023 9:09:50 AM By: Elliot Gurney, BSN, RN, CWS, [name] RN, BSN Entered By: Geralyn Corwin on 04/12/2023 12:13:28 [name] ([id number]) [id number] Page 21 of 22 -------------------------------------------------------------------------------- Total Contact Cast Details Patient Name: Date of Service: [name], [name] 04/12/2023 11:00 [name]: [id number] Patient Account Number: [id number] Date of Birth/Sex: Treating RN: [birth year] (47 y.o. Female) Huel Coventry Primary Care Provider: [name] Other Clinician: Referring Provider: Treating Provider/Extender: Chapman Moss in Treatment: 33 T Contact Cast Applied for Wound Assessment: otal Wound #12 Left,Medial,Plantar Foot Performed By: Physician Geralyn Corwin, MD Post Procedure Diagnosis Same as Pre-procedure Electronic Signature(s) Signed: 04/13/2023 12:49:28 PM By: Geralyn Corwin  DO Signed: 04/13/2023 5:12:55 PM By: [name] Entered By: [name] on 04/12/2023 11:45:23 -------------------------------------------------------------------------------- SuperBill Details Patient Name: Date of Service: [name] 04/12/2023 Medical Record Number: [id number] Patient Account Number: [id number] Date of Birth/Sex: Treating RN: November 26, 1975 (47 y.o. 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.621 Type 2 diabetes mellitus with foot ulcer E11.42 Type 2 diabetes mellitus with diabetic polyneuropathy L97.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 of skin Z79.4 Long term (current) use of insulin Facility Procedures : SUHAYLAH, WAMPOLE Code: [id number] RISTI [name] ([id number] Description: [phone] - APPLY TOTAL CONTACT LEG CAST ICD-10 Diagnosis Description 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

## 2023-04-17 NOTE — Progress Notes (Signed)
MONASIA, LAIR [name] ([id number]) [id number] Page 1 of 10 Visit Report for 04/12/2023 Arrival Information Details Patient Name: Date of Service: [name], [name] 04/12/2023 11:00 [name]: [id number] Patient Account Number: [id number] Date of Birth/Sex: Treating RN: [birth year] (47 y.o. Female) Huel Coventry Primary Care Yolonda Purtle: [name] Other Clinician: Referring Devera Englander: Treating Seymore Brodowski/Extender: Chapman Moss in Treatment: 60 Visit Information History Since Last Visit All ordered tests and consults were completed: No Patient Arrived: Wheel Chair Added or deleted any medications: No Arrival Time: 11:13 Any new allergies or adverse reactions: No Transfer Assistance: None Had [name] fall or experienced change in No Patient Identification Verified: Yes activities of daily living that may affect Secondary Verification Process Completed: Yes risk of falls: Patient Requires Transmission-Based Precautions: No Signs or symptoms of abuse/neglect since last visito No Patient Has Alerts: No Hospitalized since last visit: No Implantable device outside of the clinic excluding No cellular tissue based products placed in the center since last visit: Has Dressing in Place as Prescribed: Yes Has Footwear/Offloading in Place as Prescribed: Yes Left: T Contact Cast otal Pain Present Now: No Electronic Signature(s) Signed: 04/13/2023 5:12:55 PM By: [name] Entered By: [name] on 04/12/2023 11:15:58 -------------------------------------------------------------------------------- Clinic Level of Care Assessment Details Patient Name: Date of Service: TASHEIKA, KITZMILLER 04/12/2023 11:00 [name]: [id number] Patient Account Number: [id number] Date of Birth/Sex: Treating RN: 03/24/1976 (47 y.o. Female) Huel Coventry Primary Care Maricarmen Braziel: [name] Other Clinician: Referring Nicholette Dolson: Treating  Brendaliz Kuk/Extender: Chapman Moss in Treatment: 66 Clinic Level of Care Assessment Items TOOL 1 Quantity Score []  - 0 Use when EandM and Procedure is performed on INITIAL visit ASSESSMENTS - Nursing Assessment / Reassessment []  - 0 General Physical Exam (combine w/ comprehensive assessment (listed just below) when performed on new pt. evalsNANCE, [name] ([id number]) [id number] Page 2 of 10 []  - 0 Comprehensive Assessment (HX, ROS, Risk Assessments, Wounds Hx, etc.) ASSESSMENTS - Wound and Skin Assessment / Reassessment []  - 0 Dermatologic / Skin Assessment (not related to wound area) ASSESSMENTS - Ostomy and/or Continence Assessment and Care []  - 0 Incontinence Assessment and Management []  - 0 Ostomy Care Assessment and Management (repouching, etc.) PROCESS - Coordination of Care []  - 0 Simple Patient / Family Education for ongoing care []  - 0 Complex (extensive) Patient / Family Education for ongoing care []  - 0 Staff obtains Chiropractor, Records, T Results / Process Orders est []  - 0 Staff telephones HHA, Nursing Homes / Clarify orders / etc []  - 0 Routine Transfer to another Facility (non-emergent condition) []  - 0 Routine Hospital Admission (non-emergent condition) []  - 0 New Admissions / Manufacturing engineer / Ordering NPWT Apligraf, etc. , []  - 0 Emergency Hospital Admission (emergent condition) PROCESS - Special Needs []  - 0 Pediatric / Minor Patient Management []  - 0 Isolation Patient Management []  - 0 Hearing / Language / Visual special needs []  - 0 Assessment of Community assistance (transportation, D/C planning, etc.) []  - 0 Additional assistance / Altered mentation []  - 0 Support Surface(s) Assessment (bed, cushion, seat, etc.) INTERVENTIONS - Miscellaneous []  - 0 External ear exam []  - 0 Patient Transfer (multiple staff / Nurse, adult / Similar devices) []  - 0 Simple Staple / Suture removal  (25 or less) []  - 0 Complex Staple / Suture removal (26 or more) []  - 0 Hypo/Hyperglycemic Management (do not check if billed separately) []  - 0 Ankle / Brachial Index (ABI) -  do not check if billed separately Has the patient been seen at the hospital within the last three years: Yes Total Score: 0 Level Of Care: ____ Electronic Signature(s) Signed: 04/13/2023 5:12:55 PM By: [name] Entered By: [name] on 04/12/2023 11:46:25 -------------------------------------------------------------------------------- Encounter Discharge Information Details Patient Name: Date of Service: Heather Jefferson [name]. 04/12/2023 11:00 [name]: [id number] Patient Account Number: [id number] Date of Birth/Sex: Treating RN: 21-Jan-1976 (47 y.o. Female) Huel Coventry [name] ([id number]) [id number] Page 3 of 10 Primary Care Burnett Lieber: [name] Other Clinician: Referring Shantrice Rodenberg: Treating Olubunmi Rothenberger/Extender: Chapman Moss in Treatment: 23 Encounter Discharge Information Items Post Procedure Vitals Discharge Condition: Stable Temperature (F): 98.8 Ambulatory Status: Wheelchair Pulse (bpm): 60 Discharge Destination: Home Respiratory Rate (breaths/min): 16 Transportation: Private Auto Blood Pressure (mmHg): 104/56 Accompanied By: mother Schedule Follow-up Appointment: Yes Clinical Summary of Care: Electronic Signature(s) Signed: 04/13/2023 5:12:55 PM By: [name] Entered By: [name] on 04/12/2023 13:40:51 -------------------------------------------------------------------------------- Lower Extremity Assessment Details Patient Name: Date of Service: [name], [name] 04/12/2023 11:00 [name]: [id number] Patient Account Number: [id number] Date of Birth/Sex: Treating RN: 03/04/1976 (47 y.o. Female) Huel Coventry Primary Care Jakera Beaupre: [name] Other Clinician: Referring  Anushri Casalino: Treating Charlene Cowdrey/Extender: [name] Weeks in Treatment: 56 Edema Assessment Assessed: [Left: No] [Right: No] [Left: Edema] [Right: :] Calf Left: Right: Point of Measurement: 31 cm From Medial Instep Ankle Left: Right: Point of Measurement: 10 cm From Medial Instep Electronic Signature(s) Signed: 04/13/2023 5:12:55 PM By: [name] Signed: 04/17/2023 9:09:50 AM By: Elliot Gurney, BSN, RN, CWS, [name] RN, BSN Entered By: [name] on 04/12/2023 11:29:40 [name] ([id number]) [id number] Page 4 of 10 -------------------------------------------------------------------------------- Multi Wound Chart Details Patient Name: Date of Service: [name], [name] 04/12/2023 11:00 [name]: [id number] Patient Account Number: [id number] Date of Birth/Sex: Treating RN: 09-May-1976 (47 y.o. Female) Huel Coventry Primary Care Vlada Uriostegui: [name] Other Clinician: Referring Elray Dains: Treating Oronde Hallenbeck/Extender: Chapman Moss in Treatment: 47 Vital Signs Height(in): 69 Pulse(bpm): 60 Weight(lbs): 178 Blood Pressure(mmHg): 104/56 Body Mass Index(BMI): 26.3 Temperature(F): 98.8 Respiratory Rate(breaths/min): 16 [11:Photos:] [N/[name]:N/[name]] Right, Plantar Foot Left, Medial, Plantar Foot N/[name] Wound Location: Surgical Injury Pressure Injury N/[name] Wounding Event: Open Surgical Wound 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: Extremity Chronic sinus problems/congestion, Chronic sinus problems/congestion, N/[name] Comorbid History: Middle ear problems, Anemia, Chronic Middle ear problems, Anemia, Chronic Obstructive Pulmonary Disease Obstructive Pulmonary Disease (COPD), Congestive Heart Failure, (COPD), Congestive Heart Failure, Type II Diabetes, End Stage Renal Type II Diabetes, End Stage Renal Disease, History of pressure wounds, Disease, History of pressure wounds, Neuropathy  Neuropathy 12/01/2021 03/16/2020 N/[name] Date Acquired: 34 56 N/[name] Weeks of Treatment: Open Open N/[name] Wound Status: No No N/[name] Wound Recurrence: 0.1x0.1x0.4 0.8x0.6x0.1 N/[name] Measurements L x W x D (cm) 0.008 0.377 N/[name] [name] (cm) : rea 0.003 0.038 N/[name] Volume (cm) : 99.60% 92.00% N/[name] % Reduction in Area: 99.80% 96.00% N/[name] % Reduction in Volume: Full Thickness Without Exposed Grade 3 N/[name] Classification: Support Structures Small Medium N/[name] Exudate Amount: Serous Serosanguineous N/[name] Exudate Type: amber red, brown N/[name] Exudate Color: Distinct, outline attached Flat and Intact N/[name] Wound Margin: Small (1-33%) Large (67-100%) N/[name] Granulation Amount: Pink, Pale Pink, Pale N/[name] Granulation Quality: Large (67-100%) Small (1-33%) N/[name] Necrotic Amount: Fat Layer (Subcutaneous Tissue): Yes Fat Layer (Subcutaneous Tissue): Yes N/[name] Exposed Structures: Fascia: No Fascia: No Tendon: No Tendon: No Muscle: No Muscle: No Joint:  No Joint: No Bone: No Bone: No Medium (34-66%) None N/[name] Epithelialization: Treatment Notes Electronic Signature(s) Signed: 04/13/2023 5:12:55 PM By: [name] Entered By: [name] on 04/12/2023 11:29:46 [name] ([id number]) [id number] Page 5 of 10 -------------------------------------------------------------------------------- Multi-Disciplinary Care Plan Details Patient Name: Date of Service: [name], [name] 04/12/2023 11:00 [name]: [id number] Patient Account Number: [id number] Date of Birth/Sex: Treating RN: Jun 18, 1976 (47 y.o. Female) Huel Coventry Primary Care Santiana Glidden: [name] Other Clinician: Referring Dontravious Camille: Treating Jospeh Mangel/Extender: Chapman Moss in Treatment: 52 Active Inactive Abuse / Safety / Falls / Self Care Management Nursing Diagnoses: History of Falls Potential for falls Potential for injury related to falls Goals: Patient will not develop  complications from immobility Date Initiated: 03/17/2022 Date Inactivated: 04/20/2022 Target Resolution Date: 03/16/2022 Goal Status: Met Patient/caregiver will verbalize understanding of skin care regimen Date Initiated: 03/17/2022 Date Inactivated: 09/28/2022 Target Resolution Date: 03/16/2022 Goal Status: Met Patient/caregiver will verbalize/demonstrate measure taken to improve self care Date Initiated: 03/17/2022 Target Resolution Date: 04/20/2022 Goal Status: Active Interventions: Assess fall risk on admission and as needed Provide education on basic hygiene Provide education on personal and home safety Notes: Osteomyelitis Nursing Diagnoses: Infection: osteomyelitis Knowledge deficit related to disease process and management Goals: Patient/caregiver will verbalize understanding of disease process and disease management Date Initiated: 03/17/2022 Date Inactivated: 03/27/2023 Target Resolution Date: 03/16/2022 Goal Status: Met Patient's osteomyelitis will resolve Date Initiated: 03/17/2022 Target Resolution Date: 10/26/2022 Goal Status: Active Signs and symptoms for osteomyelitis will be recognized and promptly addressed Date Initiated: 03/17/2022 Target Resolution Date: 10/26/2022 Goal Status: Active Interventions: Assess for signs and symptoms of osteomyelitis resolution every visit Provide education on osteomyelitis Treatment Activities: Systemic antibiotics : 03/16/2022 Notes: Patient on antibiotics. [name], [name] ([id number]) [id number] Page 6 of 10 Electronic Signature(s) Signed: 04/13/2023 5:12:55 PM By: [name] Signed: 04/17/2023 9:09:50 AM By: Elliot Gurney, BSN, RN, CWS, [name] RN, BSN Entered By: [name] on 04/12/2023 11:46:41 -------------------------------------------------------------------------------- Pain Assessment Details Patient Name: Date of Service: Heather Jefferson [name]. 04/12/2023 11:00 [name]:  [id number] Patient Account Number: [id number] Date of Birth/Sex: Treating RN: 1976-02-04 (47 y.o. Female) Huel Coventry Primary Care Brandyce Dimario: [name] Other Clinician: Referring Zhane Donlan: Treating Tiyanna Larcom/Extender: Chapman Moss in Treatment: 66 Active Problems Location of Pain Severity and Description of Pain Patient Has Paino No Site Locations Pain Management and Medication Current Pain Management: Electronic Signature(s) Signed: 04/13/2023 5:12:55 PM By: [name] Signed: 04/17/2023 9:09:50 AM By: Elliot Gurney, BSN, RN, CWS, [name] RN, BSN Entered By: [name] on 04/12/2023 11:18:06 [name] ([id number]) [id number] Page 7 of 10 -------------------------------------------------------------------------------- Patient/Caregiver Education Details Patient Name: Date of Service: [name], [name] 6/19/2024andnbsp11:00 [name]: [id number] Patient Account Number: [id number] Date of Birth/Gender: Treating RN: [birth year] (47 y.o. Female) Huel Coventry Primary Care Physician: [name] Other Clinician: Referring Physician: Treating Physician/Extender: Chapman Moss in Treatment: 36 Education Assessment Education Provided To: Patient Education Topics Provided Wound/Skin Impairment: Handouts: Other: continue wound care as directed Methods: Explain/Verbal Responses: State content correctly Electronic Signature(s) Signed: 04/13/2023 5:12:55 PM By: [name] Entered By: [name] on 04/12/2023 11:47:04 -------------------------------------------------------------------------------- Wound Assessment Details Patient Name: Date of Service: [name], [name] 04/12/2023 11:00 [name]: [id number] Patient Account Number: [id number] Date of Birth/Sex: Treating RN: [birth year] (47 y.o. Female) Huel Coventry Primary Care Jahid Weida: [name] Other  Clinician: Referring Cristofher Livecchi: Treating Journey Ratterman/Extender: [name]  Weeks in Treatment: 56 Wound Status Wound Number: 11 Primary Open Surgical Wound Etiology: Wound Location: Right, Plantar Foot Wound Open Wounding Event: Surgical Injury Status: Date Acquired: 12/01/2021 Comorbid Chronic sinus problems/congestion, Middle ear problems, Anemia, Weeks Of Treatment: 56 History: Chronic Obstructive Pulmonary Disease (COPD), Congestive Heart Clustered Wound: No Failure, Type II Diabetes, End Stage Renal Disease, History of pressure wounds, Neuropathy Photos Wound Measurements Hert, Jayla [name] ([id number]) Length: (cm) 0.1 Width: (cm) 0.1 Depth: (cm) 0.4 Area: (cm) 0.008 Volume: (cm) 0.003 [id number] Page 8 of 10 % Reduction in Area: 99.6% % Reduction in Volume: 99.8% Epithelialization: Medium (34-66%) Wound Description Classification: Full Thickness Without Exposed Suppo Wound Margin: Distinct, outline attached Exudate Amount: Small Exudate Type: Serous Exudate Color: amber rt Structures Foul Odor After Cleansing: No Slough/Fibrino Yes Wound Bed Granulation Amount: Small (1-33%) Exposed Structure Granulation Quality: Pink, Pale Fascia Exposed: No Necrotic Amount: Large (67-100%) Fat Layer (Subcutaneous Tissue) Exposed: Yes Necrotic Quality: Adherent Slough Tendon Exposed: No Muscle Exposed: No Joint Exposed: No Bone Exposed: No Treatment Notes Wound #11 (Foot) Wound Laterality: Plantar, Right Cleanser Wound Cleanser Discharge Instruction: Wash your hands with soap and water. Remove old dressing, discard into plastic bag and place into trash. Cleanse the wound with Wound Cleanser prior to applying [name] clean dressing using gauze sponges, not tissues or cotton balls. Do not scrub or use excessive force. Pat dry using gauze sponges, not tissue or cotton balls. Heather-Wound Care Topical Primary Dressing Aquacel Extra  Hydrofiber Dressing, 2x2 (in/in) Secondary Dressing ABD Pad 5x9 (in/in) Discharge Instruction: Cover with ABD pad Secured With Medipore T - 74M Medipore H Soft Cloth Surgical T ape ape, 2x2 (in/yd) Kerlix Roll Sterile or Non-Sterile 6-ply 4.5x4 (yd/yd) Discharge Instruction: Apply Kerlix as directed Compression Wrap Compression Stockings Add-Ons Electronic Signature(s) Signed: 04/13/2023 5:12:55 PM By: [name] Signed: 04/17/2023 9:09:50 AM By: Elliot Gurney, BSN, RN, CWS, [name] RN, BSN Entered By: [name] on 04/12/2023 11:28:49 -------------------------------------------------------------------------------- Wound Assessment Details Patient Name: Date of Service: Heather Jefferson [name]. 04/12/2023 11:00 [name]: [id number] Patient Account Number: [id number] [name], [name] ([id number]) [id number] Page 9 of 10 Date of Birth/Sex: Treating RN: 1976/05/27 (47 y.o. Female) Huel Coventry Primary Care Linday Rhodes: Other Clinician: [name] Referring Keirah Konitzer: Treating Yarah Fuente/Extender: Chapman Moss in Treatment: 56 Wound Status Wound Number: 12 Primary Diabetic Wound/Ulcer of the Lower Extremity Etiology: Wound Location: Left, Medial, Plantar Foot Wound Open Wounding Event: Pressure Injury Status: Date Acquired: 03/16/2020 Comorbid Chronic sinus problems/congestion, Middle ear problems, Anemia, Weeks Of Treatment: 56 History: Chronic Obstructive Pulmonary Disease (COPD), Congestive Heart Clustered Wound: No Failure, Type II Diabetes, End Stage Renal Disease, History of pressure wounds, Neuropathy Photos Wound Measurements Length: (cm) 0.8 Width: (cm) 0.6 Depth: (cm) 0.1 Area: (cm) 0.377 Volume: (cm) 0.038 % Reduction in Area: 92% % Reduction in Volume: 96% Epithelialization: None Wound Description Classification: Grade 3 Wound Margin: Flat and Intact Exudate Amount: Medium Exudate Type:  Serosanguineous Exudate Color: red, brown Foul Odor After Cleansing: No Slough/Fibrino No Wound Bed Granulation Amount: Large (67-100%) Exposed Structure Granulation Quality: Pink, Pale Fascia Exposed: No Necrotic Amount: Small (1-33%) Fat Layer (Subcutaneous Tissue) Exposed: Yes Necrotic Quality: Adherent Slough Tendon Exposed: No Muscle Exposed: No Joint Exposed: No Bone Exposed: No Treatment Notes Wound #12 (Foot) Wound Laterality: Plantar, Left, Medial Cleanser Wound Cleanser Discharge Instruction: Wash your hands with soap and water. Remove old dressing, discard into plastic bag and place into trash. Cleanse the wound with Wound Cleanser  prior to applying [name] clean dressing using gauze sponges, not tissues or cotton balls. Do not scrub or use excessive force. Pat dry using gauze sponges, not tissue or cotton balls. Heather-Wound Care Topical Primary Dressing Hydrofera Blue Ready Transfer Foam, 2.5x2.5 (in/in) Discharge Instruction: Apply Hydrofera Blue Ready to wound bed as directed Secondary Dressing heel cup Discharge Instruction: x2 for heel and front of foot Secured With [name], [name] ([id number]) [phone].pdf Page 10 of 10 Medipore T - 4M Medipore H Soft Cloth Surgical T ape ape, 2x2 (in/yd) Kerlix Roll Sterile or Non-Sterile 6-ply 4.5x4 (yd/yd) Discharge Instruction: Apply Kerlix as directed Compression Wrap Compression Stockings Add-Ons Electronic Signature(s) Signed: 04/13/2023 5:12:55 PM By: [name] Signed: 04/17/2023 9:09:50 AM By: Elliot Gurney, BSN, RN, CWS, [name] RN, BSN Entered By: [name] on 04/12/2023 11:29:25 -------------------------------------------------------------------------------- Vitals Details Patient Name: Date of Service: [name], Edward [name]. 04/12/2023 11:00 [name]: [id number] Patient Account Number: [id number] Date of Birth/Sex: Treating RN: 1976-05-31 (47 y.o. Female) Huel Coventry Primary Care  Annastasia Haskins: [name] Other Clinician: Referring Margaretta Chittum: Treating Shiane Wenberg/Extender: Chapman Moss in Treatment: 80 Vital Signs Time Taken: 11:16 Temperature (F): 98.8 Height (in): 69 Pulse (bpm): 60 Weight (lbs): 178 Respiratory Rate (breaths/min): 16 Body Mass Index (BMI): 26.3 Blood Pressure (mmHg): 104/56 Reference Range: 80 - 120 mg / dl Electronic Signature(s) Signed: 04/13/2023 5:12:55 PM By: [name] Entered By: [name] on 04/12/2023 11:18:00

## 2023-04-19 ENCOUNTER — Encounter (HOSPITAL_BASED_OUTPATIENT_CLINIC_OR_DEPARTMENT_OTHER): Payer: Medicaid Other | Admitting: Internal Medicine

## 2023-04-19 DIAGNOSIS — E11621 Type 2 diabetes mellitus with foot ulcer: Secondary | ICD-10-CM

## 2023-04-19 DIAGNOSIS — L97528 Non-pressure chronic ulcer of other part of left foot with other specified severity: Secondary | ICD-10-CM

## 2023-04-19 DIAGNOSIS — L97512 Non-pressure chronic ulcer of other part of right foot with fat layer exposed: Secondary | ICD-10-CM

## 2023-04-20 NOTE — Progress Notes (Signed)
OKEMA, ROLLINSON A ([id number]) [id number] Page 1 of 23 Visit Report for 04/19/2023 Chief Complaint Document Details Patient Name: Date of Service: MAGDALA, BRAHMBHATT 04/19/2023 11:00 A M Medical Record Number: [id number] Patient Account Number: [id number] Date of Birth/Sex: Treating RN: 1976-03-18 (47 y.o. Skip Mayer Primary Care Provider: [name] Other Clinician: Referring Provider: Treating Provider/Extender: Chapman Moss in Treatment: 84 Information Obtained from: Patient Chief Complaint 03/19/2021; patient referred by Dr. Excell Seltzer who has been looking after her left foot for quite a period of time for review of a nonhealing area in the left midfoot 03/12/2022; bilateral feet wounds and right lower extremity wound. Electronic Signature(s) Signed: 04/19/2023 4:25:37 PM By: Geralyn Corwin DO Entered By: Geralyn Corwin on 04/19/2023 12:39:34 -------------------------------------------------------------------------------- Debridement Details Patient Name: Date of Service: [name] 04/19/2023 11:00 A M Medical Record Number: [id number] Patient Account Number: [id number] Date of Birth/Sex: Treating RN: [birth year] (47 y.o. Skip Mayer Primary Care Provider: [name] Other Clinician: Referring Provider: Treating Provider/Extender: Chapman Moss in Treatment: 57 Debridement Performed for Assessment: Wound #12 Left,Medial,Plantar Foot Performed By: Physician Geralyn Corwin, MD Debridement Type: Debridement Severity of Tissue Pre Debridement: Fat layer exposed Level of Consciousness (Pre-procedure): Awake and Alert Pre-procedure Verification/Time Out Yes - 11:28 Taken: Start Time: 11:28 Percent of Wound Bed Debrided: 100% T Area Debrided (cm): otal 0.09 Tissue and other material debrided: Viable, Non-Viable, Callus, Slough, Skin: Dermis , Skin: Epidermis, Slough Level:  Skin/Epidermis Debridement Description: Selective/Open Wound Instrument: Curette Bleeding: Minimum Hemostasis Achieved: Pressure Response to Treatment: Procedure was tolerated well Level of Consciousness (Post- Awake and Alert KASYN, STOUFFER A ([id number]) [id number] Page 2 of 23 Awake and Alert procedure): Post Debridement Measurements of Total Wound Length: (cm) 0.3 Width: (cm) 0.4 Depth: (cm) 0.1 Volume: (cm) 0.009 Character of Wound/Ulcer Post Debridement: Improved Severity of Tissue Post Debridement: Fat layer exposed Post Procedure Diagnosis Same as Pre-procedure Electronic Signature(s) Signed: 04/19/2023 4:25:37 PM By: Geralyn Corwin DO Signed: 04/19/2023 5:16:31 PM By: [name] Signed: 04/20/2023 4:37:47 PM By: Elliot Gurney, BSN, RN, CWS, [name] RN, BSN Entered By: [name] on 04/19/2023 11:29:38 -------------------------------------------------------------------------------- Debridement Details Patient Name: Date of Service: [name] 04/19/2023 11:00 A M Medical Record Number: [id number] Patient Account Number: [id number] Date of Birth/Sex: Treating RN: Aug 13, 1976 (47 y.o. Skip Mayer Primary Care Provider: [name] Other Clinician: Referring Provider: Treating Provider/Extender: Chapman Moss in Treatment: 57 Debridement Performed for Assessment: Wound #11 Right,Plantar Foot Performed By: Physician Geralyn Corwin, MD Debridement Type: Debridement Level of Consciousness (Pre-procedure): Awake and Alert Pre-procedure Verification/Time Out Yes - 11:31 Taken: Start Time: 11:31 Percent of Wound Bed Debrided: 100% T Area Debrided (cm): otal 0.06 Tissue and other material debrided: Viable, Non-Viable, Callus, Slough, Slough Level: Non-Viable Tissue Debridement Description: Selective/Open Wound Instrument: Curette Bleeding: Minimum Hemostasis Achieved: Pressure Response to Treatment: Procedure  was tolerated well Level of Consciousness (Post- Awake and Alert procedure): Post Debridement Measurements of Total Wound Length: (cm) 0.2 Width: (cm) 0.4 Depth: (cm) 0.4 Volume: (cm) 0.025 Character of Wound/Ulcer Post Debridement: Improved Post Procedure Diagnosis Same as Pre-procedure Electronic Signature(s) Signed: 04/19/2023 4:25:37 PM By: Geralyn Corwin DO Signed: 04/19/2023 5:16:31 PM By: [name] Signed: 04/20/2023 4:37:47 PM By: Elliot Gurney BSN, RN, CWS, [name] RN, BSN Pump Back, Kaytee A ([id number]) [phone].pdf Page 3 of 23 Signed: 04/20/2023 4:37:47 PM By: Elliot Gurney, BSN, RN, CWS, [name] RN, BSN Entered By: [name] on 04/19/2023 11:31:39 -------------------------------------------------------------------------------- HPI Details Patient Name:  Date of Service: CERINITY, ZYNDA 04/19/2023 11:00 A M Medical Record Number: [id number] Patient Account Number: [id number] Date of Birth/Sex: Treating RN: 1976/04/14 (47 y.o. Skip Mayer Primary Care Provider: [name] Other Clinician: Referring Provider: Treating Provider/Extender: Chapman Moss in Treatment: 54 History of Present Illness HPI Description: 01/18/18-She is here for initial evaluation of the left great toe ulcer. She is a poor historian in regards to timeframe in detail. She states approximately 4 weeks ago she lacerated her toe on something in the house. She followed up with her primary care who placed her on Bactrim and ultimately a second dose of Bactrim prior to coming to wound clinic. She states she has been treating the toe with peroxide, Betadine and a Band-Aid. She did not check her blood sugar this morning but checked it yesterday morning it was 327; she is unaware of a recent A1c and there are no current records. She saw Dr. she would've orthopedics last week for an old injury to the left ankle, she states he did not see her toe, nor did she bring it to his  attention. She smokes approximately 1 pack cigarettes a day. Her social situation is concerning, she arrives this morning with her mother who appears extremely intoxicated/under the influence; her mother was asked to leave the room and be monitored by the patient's grandmother. The patient's aunt then accompanied the patient and the room throughout the rest of the appointment. We had a lengthy discussion regarding the deleterious effects of uncontrolled hyperglycemia and smoking as it relates to wound healing and overall health. She was strongly encouraged to decrease her smoking and get her diabetes under better control. She states she is currently on a diet and has cut down her [facility] consumption. The left toe is erythematous, macerated and slightly edematous with malodor present. The edema in her left foot is below her baseline, there is no erythema streaking. We will treat her with Santyl, doxycycline; we have ordered and xray, culture and provided a Peg assist surgical shoe and cultured the wound. 01/25/18-She is here in follow-up evaluation for a left great toe ulcer and presents with an abscess to her suprapubic area. She states her blood sugars remain elevated, feeling "sick" and if levels are below 250, but she is trying. She has made no attempt to decrease her smoking stating that we "can't take away her food in her cigarettes". She has been compliant with offloading using the PEG assist you. She is using Santyl daily. the culture obtained last week grew staph aureus and Enterococcus faecalis; continues on the doxycycline and Augmentin was added on Monday. The suprapubic area has erythema, no femoral variation, purple discoloration, minimal induration, was accessed with a cotton tip applicator with sanguinopurulent drainage, this was cultured, I suspect the current antibiotic treatment will cover and we will not add anything to her current treatment plan. She was advised to go to urgent  care or ER with any change in redness, induration or fever. 02/01/18-She is here in follow-up evaluation for left great toe ulcers and a new abdominal abscess from last week. She was able to use packing until earlier this week, where she "forgot it was there". She states she was feeling ill with GI symptoms last week and was not taking her antibiotic. She states her glucose levels have been predominantly less than 200, with occasional levels between 200-250. She thinks this was contributing to her GI symptoms as they have resolved without intervention.  There continues to be significant laceration to left toe, otherwise it clinically looks stable/improved. There is now less superficial opening to the lateral aspect of the great toe that was residual blister. We will transition to [facility] to all wounds, she will continue her Augmentin. If there is no change or deterioration next week for reculture. 02/08/18-She is here in follow-up evaluation for left great toe ulcer and abdominal ulcer. There is an improvement in both wounds. She has been wrapping her left toe with coban, not by our direction, which has created an area of discoloration to the medial aspect; she has been advised to NOT use coban secondary to her neuropathy. She states her glucose levels have been high over this last week ranging from 200-350, she continues to smoke. She admits to being less compliant with her offloading shoe. We will continue with same treatment plan and she will follow-up next week. 02/15/18-She is here in follow-up evaluation for left great toe ulcer and abdominal ulcer. The abdominal ulcer is epithelialized. The left great toe ulcer is improved and all injury from last week using the Coban wrap is resolved, the lateral ulcer is healed. She admits to noncompliance with wearing offloading shoe and admits to glucose levels being greater than 300 most of the week. She continues to smoke and expresses no desire to  quit. There is one area medially that probes deeper than it has historically, erythema to the toe and dorsal foot has consistently waxed and waned. There is no overt signs of cellulitis or infection but we will culture the wound for any occult infection given the new area of depth and erythema. We will hold off on sensitivities for initiation of antibiotic therapy. 02/22/18-She is here in follow up evaluation for left great toe ulcer. There is overall significant improvement in both wound appearance, erythema and edema with changes made last week. She was not initiated on antibiotic therapy. Culture obtained last week showed oxacillin sensitive staph aureus, sensitive to clindamycin. Clindamycin has been called into the pharmacy but she has been instructed to hold off on initiation secondary to overall clinical improvement and her history of antibiotic intolerance. She has been instructed to contact the clinic with any noted changes/deterioration and the wound, erythema, edema and/or pain. She will follow-up next week. She continues to smoke and her glucose levels remain elevated >250; she admits to compliance with offloading shoe 03/01/18 on evaluation today patient appears to be doing fairly well in regard to her left first toe ulcer. She has been tolerating the dressing changes with the [facility] Dressing without complication and overall this has definitely showed signs of improvement according to records as well is what the patient tells me today. I'm very pleased in that regard. She is having no pain today 03/08/18 She is here for follow up evaluation of a left great toe ulcer. She remains non-compliant with glucose control and smoking cessation; glucose levels consistently >200. She states that she got new shoe inserts/peg assist. She admits to compliance with offloading. Since my last evaluation there is significant improvement. We will switch to prisma at this time and she will follow up next  week. She is noted to be tachycardic at this appointment, heart rate 120s; she has a history of heart rate 70-130 according to our records. She admits to extreme agitation r/t personal issues; she was advised to monitor her heartrate and contact her physician if it does not return to a more normal range (<100). She takes cardizem  twice daily. 03/15/18-She is here in follow-up evaluation for left great toe ulcer. She remains noncompliant with glucose control and smoking cessation. She admits to compliance with wearing offloading shoe. The ulcer is improved/stable and we will continue with the same treatment plan and she will follow-up next week 03/22/18-She is here for evaluation for left great toe ulcer. There continues to be significant improvement despite recurrent hyperglycemia (over 500 yesterday) and she continues to smoke. She has been compliant with offloading and we will continue with same treatment plan and she will follow-up next week. 03/29/18-She is here for evaluation for left great toe ulcer. Despite continuing to smoke and uncontrolled diabetes she continues to improve. She is compliant with offloading shoe. We will continue with the same treatment plan and she will follow-up next week 04/05/18- She is here in follow up evaluation for a left great toe ulcer; she presents with small pustule to left fifth toe (resembles ant bite). She admits to compliance with wearing offloading shoe; continues to smoke or have uncontrolled blood glucose control. There is more callus than usual with evidence of bleeding; she denies known trauma. MEHAR, SAGEN A ([id number]) [id number] Page 4 of 23 04/12/18-She is here for evaluation of left great toe ulcer. Despite noncompliance with glycemic control and smoking she continues to make improvement. She continues to wear offloading shoe. The pustule, that was identified last week, to the left fifth toe is resolved. She will follow-up in  2 weeks 05/03/18-she is seen in follow-up evaluation for a left great toe ulcer. She is compliant with offloading, otherwise noncompliant with glycemic control and smoking. She has plateaued and there is minimal improvement noted. We will transition to [facility], replaced the insert to her surgical shoe and she will follow-up in one week 05/10/18- She is here in follow up evaluation for a left great toe ulcer. It appears stable despite measurement change. We will continue with same treatment plan and follow up next week. 05/24/18-She is seen in follow-up evaluation for a left great toe ulcer. She remains compliant with offloading, has made significant improvement in her diet, decreasing the amount of sugar/soda. She said her recent A1c was 10.9 which is lower than. She did see a diabetic nutritionist/educator yesterday. She continues to smoke. We will continue with the same treatment plan and she'll follow-up next week. 05/31/18- She is seen in follow-up evaluation for left great toe ulcer. She continues to remain compliant with offloading, continues to make improvement in her diet, increasing her water and decreasing the amount of sugar/soda. She does continue to smoke with no desire to quit. We will apply Prisma to the depth and Hydrofera Blue over. We have not received insurance authorization for oasis. She will follow up next week. 06/07/18-She is seen in follow-up evaluation for left great toe ulcer. It has stalled according to today's measurements although base appears stable. She says she saw a diabetic educator yesterday; her average blood sugars are less than 300 which is an improvement for her. She continues to smoke and states "that's my next step" She continues with water over soda. We will order for xray, culture and reinstate ace wrap compression prior to placing apligraf for next week. She is voicing no complaints or concerns. Her dressing will change to iodoflex over the next week in  preparation for apligraf. 06/14/18-She is seen in follow-up evaluation for left great toe ulcer. Plain film x-ray performed last week was negative for osteomyelitis. Wound culture obtained last week grew strep B  and OSSA; she is initiated on keflex and cefdinir today; there is erythema to the toe which could be from ace wrap compression, she has a history of wrapping too tight and has has been encouraged to maintain ace wraps that we place today. We will hold off on application of apligraf today, will apply next week after antibiotic therapy has been initiated. She admits today that she has resumed taking a shower with her foot/toe submerged in water, she has been reminded to keep foot/toe out of the bath water. She will be seen in follow up next week 06/21/18-she is seen in follow-up evaluation for left great toe ulcer. She is tolerating antibiotic therapy with no GI disturbance. The wound is stable. Apligraf was applied today. She has been decreasing her smoking, only had 4 cigarettes yesterday and 1 today. She continues being more compliant in diabetic diet. She will follow-up next week for evaluation of site, if stable will remove at 2 weeks. 06/28/18- She is here in follow up evalution. Apligraf was placed last week, she states the dressing fell off on Tuesday and she was dressing with hydrofera blue. She is healed and will be discharged from the clinic today. She has been instructed to continue with smoking cessation, continue monitoring glucose levels, offloading for an additional 4 weeks and continue with hydrofera blue for additional two weeks for any possible microscopic opening. Readmission: 08/07/18 on evaluation today patient presents for reevaluation concerning the ulcer of her right great toe. She was previously discharged on 06/28/18 healed. Nonetheless she states that this began to show signs of drainage she subsequently went to her primary care provider. Subsequently an x-ray was  performed on 08/01/18 which was negative. The patient was also placed on antibiotics at that time. Fortunately they should have been effective for the infection. Nonetheless she's been experiencing some improvement but still has a lot of drainage coming from the wound itself. 08/14/18 on evaluation today patient's wound actually does show signs of improvement in regard to the erythema at this point. She has completed the antibiotics. With that being said we did discuss the possibility of placing her in a total contact cast as of today although I think that I may want to give this just a little bit more time to ensure nothing recurrence as far as her infection is concerned. I do not want to put in the cast and risk infection at that time if things are not completely resolved. With that being said she is gonna require some debridement today. 08/21/18 on evaluation today patient actually appears to be doing okay in regard to her toe ulcer. She's been tolerating the dressing changes without complication. With that being said it does appear that she is ready and in fact I think it's appropriate for [place] to go ahead and initiate the total contact cast today. Nonetheless she will require some sharp debridement to prepare the wound for application. Overall I feel like things have been progressing well but we do need to do something to get this to close more readily. 08/24/18 patient seen today for reevaluation after having had the total contact cast applied on Tuesday. She seems to have done very well the wound appears to be doing great and overall I'm pleased with the progress that she's made. There were no abnormal areas of rubbing from the cast on her lower extremity. 08/30/18 on evaluation today patient actually appears to be completely healed in regard to her plantar toe ulcer. She tells me at  this point she's been having a lot of issues with the cast. She almost fell a couple of times the state shall the step  of her dog a couple times as well. This is been a very frustrating process for her other nonetheless she has completely healed the wound which is excellent news. Overall there does not appear to be the evidence of infection at this time which is great news. 09/11/18 evaluation today patient presents for follow-up concerning her great toe ulcer on the left which has unfortunately reopened since I last saw her which was only a couple of weeks ago. Unfortunately she was not able to get in to get the shoe and potentially the AFO that's gonna be necessary due to her left foot drop. She continues with offloading shoe but this is not enough to prevent her from reopening it appears. When we last had her in the total contact cast she did well from a healing standpoint but unfortunately the wound reopened as soon as she came out of the cast within just a couple of weeks. 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 This is also part of the reason why she falls on a regular basis. I just do not believe that is gonna be safe for [place] to reinitiate the total contact cast as last time we had this on she fell 3 times one week which is definitely not normal for her. 09/18/18 upon evaluation today the patient actually appears to be doing about the same in regard to her toe ulcer. She did not contact [facility] as I asked her to even though I had given her the prescription. In fact she actually states that she has no idea where the prescription is. She did apparently call [employer] and they told her that all she needed to do was bring the prescription in order to be able to be seen and work on getting the AFO for her left foot. With all that being said she still does not have an appointment and I'm not sure were things stand that  regard. I will give her a new prescription today in order to contact them to get this set up. 09/25/18 on evaluation today patient actually appears to be doing about the same in regard to her toes ulcer. She does have a small areas which seems to have a lot of callous buildup around the edge of the wound which is going to need sharp debridement today. She still is waiting to be scheduled for evaluation with [facility] for possibility of an AFO. She states there supposed to call her tomorrow to get this set up. Unfortunately it does appear that her foot specifically the toe area is showing signs of erythema. There does not appear to be any systemic infection which is in these good news. 10/02/18 on evaluation today patient actually appears to be doing about the same in regard to her toe ulcer. This really has not done too well although it's not significantly larger it's also not significantly smaller. She has been tolerating the dressing changes without complication. She actually has her appointment with [facility] and [place] tomorrow to hopefully be measured for obtaining and AFO splint. I think this would be helpful preventing this from reoccurring. We had contemplated starting the cast this  week although to be honest I am reluctant to do that as she's been having nausea, vomiting, and seizure activity over the past three days. She has a history of seizures and have been told is nothing that can be done for these. With that being said I do believe that along with the seizures have the nausea vomiting which upon further questioning doesn't seem to be the normal for her and makes me concerned for the possibility of infection or something else going on. I discussed this with the patient and her mother during the office visit today. I do not feel the wound is effective but maybe something else. The responses this was "this just happens to her at times and we don't know why". They did not seem to be interested  in going to the hospital to have this checked out further. 10/09/18 on evaluation today patient presents for follow-up concerning her ongoing toe ulcer. She has been tolerating the dressing changes without complication. Fortunately there does not appear to be any evidence of infection which is great news however I do think that the patient would benefit from going ahead for with the total contact cast. She's actually in a wheelchair today she tells me that she will use her walker if we initiate the cast. I was very specific about the fact that if we were gonna do the cast I wanted to make sure that she was using the walker in order to prevent any falls. She tells me she does not have stairs that she has to traverse on a regular basis at her home. She has not had any seizures since last week again that something that happens to her often she tells me she did talk to [employer] and they said that it may take up to three weeks to get the brace approved for her. Hopefully that will not take that long but nonetheless in the meantime I do think the cast could be of benefit. SHIREEN, RAYBURN A ([id number]) [id number] Page 5 of 23 10/12/18 on evaluation today patient appears to be doing rather well in regard to her toe ulcer. It's just been a few days and already this is significantly improved both as far as overall appearance and size. Fortunately there's no sign of infection. She is here for her first obligatory cast change. 10/19/18 Seen today for follow up and management of left great toe ulcer. Wound continues to show improvement. Noted small open area with seroussang drainage with palpation. Denies any increased pain or recent fevers during visit. She will continue calcium alginate with offloading shoe. Denies any questions or concerns during visit. 10/26/18 on evaluation today patient appears to be doing about the same as when I last saw her in regard to her wound bed. Fortunately  there does not appear to be any signs of infection. Unfortunately she continues to have a breakdown in regard to the toe region any time that she is not in the cast. It takes almost no time at all for this to happen. Nonetheless she still has not heard anything from the brace being made by [employer] as to when exactly this will be available to her. Fortunately there is no signs of infection at this time. 10/30/18 on evaluation today patient presents for application of the total contact cast as we just received him this morning. Fortunately we are gonna be able to apply this to her today which is great news. She continues to have no significant pain which is good news. Overall  I do feel like things have been improving while she was the cast is when she doesn't have a cast that things get worse. She still has not really heard anything from [employer] regarding her brace. 11/02/18 upon evaluation today patient's wound already appears to be doing significantly better which is good news. Fortunately there does not appear to be any signs of infection also good news. Overall I do think the total contact cast as before is helping to heal this area unfortunately it's just not gonna likely keep the area closed and healed without her getting her brace at least. Again the foot drop is a significant issue for her. 11/09/18 on evaluation today patient appears to be doing excellent in regard to her toe ulcer which in fact is completely healed. Fortunately we finally got the situation squared away with the paperwork which was needed to proceed with getting her brace approved by Medicaid. I have filled that out unfortunately that information has been sent to the orthopedic office that I worked at 2 1/2 years ago and not tired Current wound care measures. Fortunately she seems to be doing very well at this time. 11/23/18 on evaluation today patient appears to be doing More Poorly Compared to Last Time I Saw Her. At [facility] She  Had Completely Healed. Currently she is continuing to have issues with reopening. She states that she just found out that the brace was approved through Medicaid now she just has to go get measured in order to have this fitted for her and then made. Subsequently she does not have an appointment for this yet that is going to complicate things we obviously cannot put her back in the cast if we do not have everything measured because they're not gonna be able to measure her foot while she is in the cast. 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 Obviously this is unfortunate and does have me somewhat worried as well. 11/30/18 on evaluation today patient's toe ulcer actually appears to be doing fairly well. The good news is she will be getting her brace in the shoes next week on Wednesday. Hopefully we will be able to get this to heal without having to go back in the cast however she may need the cast in order to get the wound completely heal and then go from there. Fortunately there's no signs of infection at this time. 12/07/18 on evaluation today patient fortunately did receive her brace and she states she could tell this definitely makes her walk better. With that being said she's been having issues with her toe where she noticed yesterday there was a lot of tissue that was loosing off this appears to be much larger than what it was previous. She also states that her leg has been read putting much across the top of her foot just about the ankle although this seems to be receiving somewhat. The total area is still red and appears to be someone infected as best I can tell. She is previously taken Bactrim and that may be a good option for her today as well. We are gonna see what I wound culture shows as well and I think that this is definitely appropriate. With that being said outside of the culture I still  need to initiate something in the interim and that's what I'm gonna go ahead and select Bactrim is a good  option for her. 12/14/18 on evaluation today patient appears to be doing better in regard to her left great toe ulcer as compared to last week's evaluation. There's still some erythema although this is significantly improved which is excellent news. Overall I do believe that she is making good progress is still gonna take some time before she is where I would like her to be from the standpoint of being able to place her back into the total contact cast. Hopefully we will be where we need to be by next week. 12/21/18 on evaluation today patient actually appears to be doing poorly in regard to her toe ulcer. She's been tolerating the dressing changes without complication. Fortunately there's no signs of systemic infection although she does have a lot of drainage from the toe ulcer and this does seem to be causing some issues at this point. She does have erythema on the distal portion of her toe that appears to be likely cellulitis. 12/28/18 on evaluation today patient actually appears to be doing a little better in my pinion in regard to her toe ulcer. With that being said she still does have some evidence of infection at this time and for her culture she had both E. coli as well as enterococcus as organisms noted on evaluation. For that reason I think that though the Keflex likely has treated the E. coli rather well this has really done nothing for the enterococcus. We are going to have to initiate treatment for this specifically. 01/04/19 on evaluation today patient's toe actually appears to be doing better from the standpoint of infection. She currently would like to see about putting the cash back on I think that this is appropriate as long as she takes care of it and keeps it from getting wet. She is gonna have some drainage we can definitely pass this up with Drawtex and alginate to try to prevent  as much drainage as possible from causing the problems. With that being said I do want to at least try her with the cast between now and Tuesday. If there any issues we can't continue to use it then I will discontinue the use of the cast at that point. 01/08/19 on evaluation today patient actually appears to be doing very well as far as her foot ulcer specifically the great toe on the left is concerned. She did have an area of rubbing on the medial aspect of her left ankle which again is from the cast. Fortunately there's no signs of infection at this point in this appears to be a very slight skin breakdown. The patient tells me she felt it rubbing but didn't think it was that bad. Fortunately there is no signs of active infection at this time which is good news. No fevers, chills, nausea, or vomiting noted at this time. 01/15/19 on evaluation today patient actually appears to be doing well in regard to her toe ulcer. Again as previous she seems to do well and she has the cast on which indicates to me that during the time she doesn't have a cast on she's putting way too much pressure on this region. Obviously I think that's gonna be an issue as with the current national emergency concerning the Covid-19 Virus it has been recommended that we discontinue the use of total contact casting by the chief medical officer of our company, Dr. Maurine Minister. The reasoning is that if a patient becomes sick and cannot come into have the cast removed they could not just  leave this on for an additional two weeks. Obviously the hospitals also do not want to receive patient's who are sick into the emergency department to potentially contaminate the region and spread the Covid-19 Virus among other sick individuals within the hospital system. Therefore at this point we are suspending the use of total contact cast until the current emergency subsides. This was all discussed with the patient today as well. 01/22/19 on evaluation  today patient's wound on her left great toe appears to be doing slightly worse than previously noted last week. She tells me that she has been on this quite a bit in fact she tells me she's been awake for 38 straight hours. This is due to the fact that she's having to care for grandparents because nobody else will. She has been taking care of them for five the last seven days since I've seen her they both have dementia his is from a stroke and her grandmother's was progressive. Nonetheless she states even her mom who knows her condition and situation has only help two of those days to take care of them she's been taking care of the rest. Fortunately there does not appear to be any signs of active infection in regard to her toe at this point although obviously it doesn't look as good as it did previous. I think this is directly related to her not taking off the pressure and friction by way of taking things easy. Though I completely understand what's going on. 01/29/19 on evaluation today patient's tools are actually appears to be showing some signs of improvement today compared to last week's evaluation as far as not necessarily the overall size of the wound but the fact that she has some new skin growth in between the two ends of the wound opening. Overall I feel like she has done well she states that she had a family member give her what sounds to be a CAM walker boot which has been helpful as well. 02/05/19 on evaluation today patient's wound bed actually appears to be doing significantly better in regard to her overall appearance of the size of the wound. With that being said she is still having an issue with offloading efficiently enough to get this to close. Apparently there is some signs of infection at this point as well unfortunately. Previously she's done well of Augmentin I really do not see anything that needs to be culture currently but there theme and cellulitis of the foot that I'm seeing I'm  gonna go ahead and place her on an antibiotic today to try to help clear this up. LEILA, SCHUFF A ([id number]) [id number] Page 6 of 23 02/12/2019 on evaluation today patient actually appears to be doing poorly in regard to her overall wound status. She tells me she has been using her offloading shoe but actually comes in today wearing her tennis shoe with the AFO brace. Again as I previously discussed with her this is really not sufficient to allow the area to heal appropriately. Nonetheless she continues to be somewhat noncompliant and I do wonder based on what she has told my nurse in the past as to whether or not a good portion of this noncompliance may be recreational drug and alcohol related. She has had a history of heroin overdose and this was fairly recently in the past couple of months that have been seeing her. Nonetheless overall I feel like her wound looks significantly worse today compared to what it was previous. She still has significant  erythema despite the Augmentin I am not sure that this is an appropriate medication for her infection I am also concerned that the infection may have gone down into her bone. 02/19/19 on evaluation today patient actually appears to be doing about the same in regard to her toe ulcer. Unfortunately she continues to show signs of bone exposure and infection at this point. There does not appear to be any evidence of worsening of the infection but I'm also not really sure that it's getting significantly better. She is on the Augmentin which should be sufficient for the Staphylococcus aureus infection that she has at this point. With that being said she may need IV antibiotics to more appropriately treat this. We did have a discussion today about hyperbaric option therapy. 02/28/19 on evaluation today patient actually appears to be doing much worse in regard to the wound on her left great toe as compared to even my previous evaluation  last week. Unfortunately this seems to be training in a pretty poor direction. Her toe was actually now starting to angle laterally and I can actually see the entire joint area of the proximal portion of the digit where is the distal portion of the digit again is no longer even in contact with the joint line. Unfortunately there's a lot more necrotic tissue around the edge and the toe appears to be showing signs of becoming gangrenous in my pinion. I'm very concerned about were things stand at this point. She did see infectious disease and they are planning to send in a prescription for Sivextro for her and apparently this has been approved. With that being said I don't think she should avoid taking this but at the same time I'm not sure that it's gonna be sufficient to save her toe at this point. 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 03/05/19 on evaluation today patient actually appears to be doing quite well in regard to her toe all things considering. She still has bone exposed but there appears to be much less your thing on overall the appearance of the wound and the toe itself is dramatically improved. She still does have some issues currently obviously with infection she did see vascular as well and there concerned that her blood flow to the toad. For that reason they are setting up for an angiogram next week. 03/14/19 on evaluation today patient appears to be doing very poor in regard to her toe and specifically in regard to the ulceration and the fact that she's starting to notice the toe was leaning even more towards the lateral aspect and the complete joint is visible on the proximal aspect of the joint. Nonetheless she's also noted a significant odor and the tip of the toe is turning  more dark and necrotic appearing. Overall I think she is getting worse not better as far as this is concerned. For that reason I am recommending at this point that she likely needs to be seen for likely amputation. READMISSION 03/19/2021 This is a patient that we cared for in this clinic for a prolonged period of time in 2019 and 2020 with a left foot and left first toe wound. 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 Excell Seltzer. In December 2021 she had an ulcer on her right great toe as well as the fourth and fifth toes. 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 She also had an angiogram at that time and underwent angioplasty of the right anterior tibial artery. In any case she claims that the wound on the right foot is closed I did not look at this today which was probably an oversight although I think that should be done next week. After her surgery she developed a dehiscence but I do not see any follow-up of this. According to Dr. Bernette Redbird last review that she was out of the area being cared for by another physician but recently came back to his attention. 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 A culture of this area showed E. coli apparently before she came back to see Dr. Excell Seltzer she was supposed to be receiving antibiotics but she did not really take them. Nor is she offloading this area at all. Finally her last hemoglobin A1c listed in epic was in March 2022 at 14.1 she says things are a lot better since then although I am not sure. 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 She had this wound at the time. During this admission she had E. coli sepsis a TEE was negative. Past medical history is extensive and includes type 2 diabetes with  peripheral neuropathy cardiomyopathy with an ejection fraction of 33%, hypertension, hyperlipidemia chronic renal failure stage III history of substance abuse with cocaine although she claims to be clean now verified by her mother. She is still a heavy cigarette smoker. She has a history of bipolar disorder seizure disorder ABI in our clinic was 1.05 6/1; left midfoot in the setting of a TMA done previously. Round circular wound with a "knuckle" of protruding tissue. The problem is that the knuckle was not attached to any of the surrounding granulation and this probed proximally widely I removed a large portion of this tissue. This wound goes with considerable undermining laterally. I do not feel any bone there was no purulence but this is a deep wound. 6/8; in spite of the debridement I did last week. She arrives with a wound looking exactly the same. A protruding "knuckle" of tissue nonadherent to most of the surrounding tissue. There is considerable depth around this from 6-12 o'clock at 2.7 cm and undermining of 1 cm. This does not look overtly infected and the x- ray I did last week was negative for any osseous abnormalities. We have been using silver collagen 6/15; deep tissue culture I did last week showed moderate staph aureus and moderate Pseudomonas. This will definitely require prolonged antibiotic therapy. The pathology on the protuberant area was negative for malignancy fungus etc. 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 We have been using silver collagen. I am going to be prescribing Levaquin for 2 weeks. Her CT scan of the foot is down for 7/5 6/22; CT scan of the foot on 7 5. She says she has hardware in the left leg from her previous fracture. She is on the Levaquin for the deep tissue culture I did that showed methicillin sensitive staph aureus and Pseudomonas. I gave her a 2-week supply and she will have another week. She  arrives in clinic today with the same  protuberant tissue however this is nonadherent to the tissue surrounding it. I am really at a loss to explain this unless there is underlying deep tissue infection 6/29; patient presents for 1 week follow-up. She has been using collagen to the wound bed. She reports taking her antibiotics as prescribed.She has no complaints or issues today. She denies signs of infection. 7/6; patient presents for one week followup. She has been using collagen to the wound bed. She states she is taking Levaquin however at times she is not able to keep it down. She denies signs of infection. 7/13; patient presents for 1 week follow-up. She has been using silver alginate to the wound bed. She still has nausea when taking her antibiotics. She denies signs of infection. 7/20; patient presents for 1 week follow-up. She has been using silver alginate with gentamicin cream to the wound bed. She denies any issues and has no complaints today. She denies signs of infection. 7/27; patient presents for 1 week follow-up. She continues to use silver alginate with gentamicin cream to the wound bed. She reports starting her antibiotics. She has no issues or complaints. Overall she reports stability to the wound. 8/3; patient presents for 1 week follow-up. She has been using silver alginate with gentamicin cream to the wound bed. She reports completing all antibiotics. She has no issues or complaints today. She denies signs of infection. 8/17; patient presents for 2-week follow-up. He is to use silver alginate to the wound bed. She has no issues or complaints today. She denies signs of infection. She reports her pain has improved in her foot since last clinic visit 8/24; patient presents for 1 week follow-up. She continues to use silver alginate to the wound bed. She has no issues or complaints. She denies signs of infection. Pain is stable. 9/7; patient presents for follow-up. She missed her  last week appointment due to feeling ill. She continues to use silver alginate. She has a new wound to the right lower extremity that is covered in eschar. She states It occurred over the past week and has no idea how it started. She currently denies signs of infection. 9/14; patient presents for follow-up. T the left foot wound she has been using gentamicin cream and silver alginate. T the right lower extremity wound she has o o been keeping this covered and has not obtain Santyl. 9/21; patient presents for follow-up. She reports using gentamicin cream and silver alginate to the left foot and Santyl to the right lower extremity wound. She has no issues or complaints today. She denies signs of infection. 9/28; patient presents for follow-up. She reports a new wound to her right heel. She states this occurred a few days ago and is progressively gotten worse. She CATORI, PANOZZO A ([id number]) [id number] Page 7 of 23 has been trying to clean the area with a Q-tip and Santyl. She reports stability in the other 2 wounds. She has been using gentamicin cream and silver alginate to the left foot and Santyl to the right lower extremity wound. 10/12; patient presents for follow-up. She reports improvement to the wound beds. She is seeing vein and vascular to discuss the potential of a left BKA. She states they are going to do an arteriogram. She continues to use silver alginate with dressing changes to her wounds. 11/2; patient presents for follow-up. She states she has not been doing dressing changes to the wound beds. She states she is not able to offload the areas. She reports chronic pain  to her left foot wound. 11/9; patient presents for follow-up. She came in with only socks on. She states she forgot to put on shoes. It is unclear if she is doing any dressing changes. She currently denies systemic signs of infection. 11/16; patient presents for follow-up. She came again only  with socks on. She states she does not wear shoes ever. It is unclear if she does dressing changes. She currently denies systemic signs of infection. 11/23; patient presents for follow-up. She wore her shoes today. It still unclear exactly what dressing she is using for each wound but she did states she obtained Dakin's solution and has been using this to the left foot wound. She currently denies signs of infection. 11/30; patient presents for follow-up. She has no issues or complaints today. She currently denies signs of infection. 12/7; patient presents for follow-up. She has no issues or complaints today. She has been using Hydrofera Blue to the right heel wound and Dakin solution to the left foot wound. Her right anterior leg wound is healed. She currently denies signs of infection. 12/14; patient presents for follow-up. She has been using Hydrofera Blue to the right heel and Dakin's to the left foot wounds. She has no issues or complaints today. She denies signs of infection. 12/21; patient presents for follow-up. She reports using Hydrofera Blue to the right heel and Dakin's to the left foot wound. She denies signs of infection. 12/28; patient presents for follow-up. She continues to use Dakin's to the left foot wound and Hydrofera Blue to the right heel wound. She denies signs of infection. 1/4; patient presents for follow-up. She has no issues or complaints today. She denies signs of infection. 1/11; patient presents for follow-up. It is unclear if she has been dressing these wounds over the past week. She currently denies signs of infection. 1/18; patient presents for follow-up. She states she has been using Dakin's wet-to-dry dressings to the left foot. She has been using Hydrofera Blue to the right foot foot wound. She states that the anterior right leg wound has reopened and draining serous fluid. She denies signs of infection. 1/25; patient presents for follow-up. She has no issues or  complaints today. 2/1; patient presents for follow-up. She has no issues or complaints today. She denies signs of infection. 2/8; patient presents for follow-up. She has lost her surgical shoes. She did not have a dressing to the right heel wound. She currently denies signs of infection. 2/15; patient presents for follow-up. She reports more pain to the right heel today. She denies purulent drainage Or fever/chills 2/22; patient presents for follow-up. She reports taking clindamycin over the past week. She states that she continues to have pain to her right heel. She reports purulent drainage. Readmission 03/16/2022 Ms. Javaria Knapke is a 47 year old female with a past medical history of type 2 diabetes, osteomyelitis to her feet, chronic systolic heart failure and bipolar disorder that presents to the clinic for bilateral feet wounds and right lower extremity wound. She was last seen in our clinic on 12/15/2021. At that time she had purulent drainage coming out of her right plantar foot and I recommended she go to the ED. She states she went to [facility] and has been there for the past 3 months. I cannot see the records. She states she had OR debridement and was on several weeks of IV antibiotics while inpatient. Since discharge she has not been taking care of the wound beds. She had nothing on  her feet other than socks today. She currently denies signs of infection. 5/31; patient presents for follow-up. She has been using Dakin's wet-to-dry dressings to the wound beds on her feet bilaterally and antibiotic ointment to the right anterior leg wound. She had a wound culture done at last clinic visit that showed moderate Pseudomonas aeruginosa sensitive to ciprofloxacin. She currently denies systemic signs of infection. 6/14; patient presents for follow-up. She received Keystone 5 days ago and has been using this on the wound beds. She states that last week she had to go to the hospital  because she had increased warmth and erythema to the right foot. She was started on 2 oral antibiotics. She states she has been taking these. She currently denies systemic signs of infection. She has no issues or complaints today. 6/21; patient presents for follow-up. She states she has been using Keystone antibiotics to the wound beds. She has no issues or complaints today. She denies signs of infection. 6/28; patient presents for follow-up. She has been using Keystone antibiotics to the wound beds. She has no issues or complaints today. 7/12; patient presents for follow-up. Has been using Keystone antibiotics to the wound beds with calcium alginate. She has no issues or complaints today. She never followed up with her orthopedic surgeon who did the OR debridement to the right foot. We discussed the total contact cast for the left foot and patient would like to do this next week. 7/19; patient presents for follow-up. She has been using Keystone antibiotics with calcium alginate to the wound beds. She has no issues or complaints today. Patient is in agreement to do the total contact cast of the left foot today. She knows to return later this week for the obligatory cast change. 05-13-2022 upon evaluation today patient's wound which she has the cast of the left leg actually appears to be doing significantly better. Fortunately I do not see any signs of active infection locally or systemically which is great news and overall I am extremely pleased with where we stand currently. 7/26; patient presents for follow-up. She has a cast in place for the past week. She states it irritated her shin. Other than that she tolerated the cast well. She states she would like a break for 1 week from the cast. We have been using Keystone antibiotic and Aquacel to both wound beds. She denies signs of infection. 8/2; patient presents for follow-up. She has been using Keystone and Aquacel to the wound beds. She denies any  issues and has no complaints. She is agreeable to have the cast placed today for the left leg. 06-03-2022 upon evaluation today patient appears to be doing well with regard to her wound she saw some signs of improvement which is great news. Fortunately I do not see any evidence of active infection locally or systemically at this time which is great news. No fevers, chills, nausea, vomiting, or diarrhea. 8/16; patient presents for follow-up. She has no issues or complaints today. We have been using Keystone and Aquacel to the wound beds. The left lower Ruggirello, Pocahontas A ([id number]) [phone].pdf Page 8 of 23 extremity is in a total contact cast. She is tolerated this well. 8/23; patient presents for follow-up. She has had the total contact cast on the left leg for the past week. Unfortunately this has rubbed and broken down the skin to the medial foot. She currently denies signs of infection. She has been using Keystone antibiotic to the right plantar foot wound. 8/30; patient  presents for follow-up. We have held off on the total contact cast for the left leg for the past week. Her wound on the left foot has improved and the previous surrounding breakdown of skin has epithelialized. She has been using Keystone antibiotic to both wound beds. She has no issues or complaints today. She denies signs of infection. 9/6; patient presents for follow-up. She has ordered her's Keystone antibiotic and this is arriving this week. She has been doing Dakin's wet-to-dry dressings to the wound beds. She denies signs of infection. She is agreeable to the total contact cast today. 9/13; patient presents for follow-up. She states that the cast caused her left leg shin to be sore. She would like to take a break from the cast this week. She has been using Keystone antibiotic to the right plantar foot wound. She denies signs of infection. 9/20; patient presents for follow-up. She has been using  Keystone antibiotics to the wound beds with calcium alginate to the right foot wound and Hydrofera Blue to the left foot wound. She is agreeable to having the cast placed today. She has been approved for Apligraf and we will order this for next clinic visit. 9/27; patient presents for follow-up. We have been using Keystone antibiotic with Hydrofera Blue to the left foot wound under a total contact cast. T the right o foot wound she has been using Keystone antibiotic and calcium alginate. She declines a total contact cast today. Apligraf is available for placement and she would like to proceed with this. 07-28-2022 upon evaluation today patient appears to be doing well currently in regard to her wound. She is actually showing signs of significant improvement which is great news. Fortunately I do not see any evidence of active infection locally nor systemically at this time. She has been seeing Dr. Mikey Bussing and to be honest has been doing very well with the cast. Subsequently she comes in today with a cast on and we did reapply that today as well. She did not really want to she try to talk me out of that but I explained that if she wanted to heal this is really the right way to go. Patient voiced understanding. In regard to her right foot this is actually a lot better compared to the last time I saw her which is also great news. 10/11; patient presents for follow-up. Apligraf and the total contact cast was placed to the left leg at last clinic visit. She states that her right foot wound had burning pain to it with the placement of Apligraf to this area. She has been doing Crandall over this area. She denies signs of infection including increased warmth, erythema or purulent drainage. 11/1; 3-week follow-up. The patient fortunately did not have a total contact cast or an Apligraf and on the left foot. She has been using Keystone ABD pads and kerlix and her own running shoes She arrives in clinic today  with thick callus and a very poor surface on the left foot on the right nonviable skin subcutaneous tissue and a deep probing hole. 11/15; patient missed her last clinic appointment. She states she has not been dressing the wound beds for the past 2 weeks. She states that at she had a new roommate but is now going back to live with her mother. Apparently its been a distracting 2 weeks. Patient currently denies signs of infection. 11/22; patient presents for follow-up. She states she has been using Keystone antibiotic and Dakin's wet-to-dry dressings to the  wound beds. She is agreeable for cast placement today. We had ordered Apligraf however this has not been received by our facility. 11/29; Patient had a total contact cast placed at last clinic visit and she tolerated this well. We were using silver alginate under the cast. Patient's been using Keystone antibiotic with Aquacel to the right plantar foot wound. She has no issues or complaints today. Apligraf is available for placement today. Patient would like to proceed with this. 12/6; patient presents for follow-up. She had Apligraf placed in standard fashion last clinic visit under the total contact cast to the left lower extremity. She has been using Keystone antibiotic and Aquacel to the right plantar foot wound. She has no issues or complaints today. 12/13; patient presents for follow-up. She has finished 5 Apligraf placements. Was told she would not qualify for more. We have been doing a total contact cast to the left lower extremity. She has been using Keystone antibiotic and Aquacel to the right plantar foot wound. She has no issues or complaints today. 12/20; patient presents for follow-up. We have been using Hydrofera Blue with Keystone antibiotic under a total contact cast of the left lower extremity. She reports using Keystone antibiotic and silver alginate to the right heel wound. She has no issues or complaints today. 12/27; patient  presents with a healthy wound on the left midfoot. We have Apligraf to apply that to that more also using a total contact cast. On the right we are using Keystone and silver alginate. She is offloading the right heel with a surgical shoealthough by her admission she is on her feet quite a bit 1/3; patient presents for follow-up. Apligraf was placed to the wound beds last clinic visit. She was placed in a total contact cast to the left lower extremity. She declines a total contact cast today. She states that her mother is in the hospital and she cannot adequately get around with the cast on. 1/10; patient presents for follow-up. She declined the total contact cast at last clinic visit. Both wounds have declined in appearance. She states that she has been on her feet and not offloading the wound beds. She currently denies signs of infection. 1/17; patient presents for follow-up. She had the total contact cast along with Apligraf placed last week to the left lower extremity. She tolerated this well. She has been using Aquacel Ag and Keystone antibiotic to the right heel wound. She currently denies signs of infection. She has no issues or complaints today. 1/24; patient presents for follow-up. We have been using Apligraf to the left foot wound along with a total contact cast. She has done well with this. T the right o heel wound she has been using Aquacel Ag and Keystone antibiotic ointment. She has no issues or complaints today. She denies signs of infection. 1/31; patient presents for follow-up. We have been using Apligraf to the left foot wound along with the total contact cast. She continues to do well with this. To the right heel we have been using Aquacel Ag and Keystone antibiotic ointment. She has no issues or complaints today. 12-01-2022 upon evaluation patient is seen today on my schedule due to the fact that she unfortunately was in the hospital yesterday. Her cast needed to come off the only  reason she is out of the hospital is due to the fact that they would not take it off at the hospital which is somewhat bewildered reading to me to be perfectly honest. I am  not certain why this was but either way she was released and then was placed on my schedule today in order to get this off and reapply the total contact casting as appropriate. I do not have an Apligraf for her today it was applied last week and today's actually expired yesterday as there was some scheduling conflicts with her being in the hospital. Nonetheless we do not have that for reapplication today but the good news that she is not draining too much and the Apligraf can go for up to 2 weeks so I am going to go ahead and reapply the total contact casting but we are going to leave the Apligraf in place. 2/14; patient presents for follow-up. T the left leg she has had the total contact cast and Apligraf for the past week. She has had no issues with this. T the o o right heel she has been using antibiotic ointment and Aquacel Ag. 2/21; patient presents for follow-up. We have been using Apligraf and a total contact cast to the left lower extremity. She is tolerated this well. Unfortunately she is not approved for any more Apligraf per insurance. She has been using antibiotic ointment and Aquacel Ag to the right foot. She has no issues or complaints today. 2/28; patient presents for follow-up. We have been using Hydrofera Blue and antibiotic ointment under the total contact cast to the left lower extremity. He has been using Aquacel Ag with antibiotic ointment to the right plantar foot. She has no issues or complaints today. 3/6; patient presents for follow-up. She did not obtain her gentamicin ointment. She has been using Aquacel Ag to the right plantar foot wound. We have been SHERITA, DECOSTE A ([id number]) [id number] Page 9 of 23 using Hydrofera Blue with antibiotic ointment under the total contact  cast to the left lower extremity. She has no issues or complaints today. 3/12; patient presents for follow-up. She has been using gentamicin ointment and Aquacel Ag to the right plantar foot wound. We have been using Hydrofera Blue with antibiotic ointment under the total contact cast on the left lower extremity foot wound. She has no issues or complaints today. 3/20; patient presents for follow-up. She has been using gentamicin ointment and Aquacel Ag to the right plantar foot wound. We have been using Hydrofera Blue with antibiotic ointment under the total contact cast to the left lower extremity wound. She has no issues or complaints today. 3/27; patient presents for follow-up. We have been using antibiotic ointment and Aquacel Ag to the right plantar foot wound. We have been using Hydrofera Blue with antibiotic ointment under the total contact cast to the left lower extremity. Both wounds are smaller. 01-24-2023 upon evaluation today patient actually appears to be doing better compared to last time I saw her. It has been several weeks since I last saw her but nonetheless I think the total contact casting is doing a good job. Fortunately I do not see any evidence of infection or worsening overall which is great news and in general I do believe that she is moving in the appropriate direction. It has been a very long road but nonetheless I feel like she is nearing the end at least in regard to the left foot and even the right foot looks much better to be perfectly honest. 01-31-2023 upon evaluation today patient appears to be doing well currently in regard to her wounds. She has been require some sharp debridement which I think is probably bone both sides to  remove a lot of callus that has built up at this point. I discussed that with her today. Also think total contact cast is doing well for the left foot Emina continue that as well. 4/17; patient presents for follow-up. We have been doing Hydrofera Blue  with antibiotic ointment under the total contact cast to the left lower extremity and Aquacel Ag with antibiotic ointment to the right foot wound. She has been using her offloading heel shoe here. 4/24; patient presents for follow-up. We have been doing Hydrofera Blue with antibiotic ointment under the total contact cast to the left lower extremity and Aquacel Ag with antibiotic ointment to the right foot wound. She has been using her offloading heel shoe here. Wounds are smaller with slough accumulation. 5/1; patient presents for follow-up. We have been doing Hydrofera Blue with antibiotic ointment under the total contact cast to the left lower extremity and Aquacel Ag with antibiotic ointment to the right foot wound. Left foot wound is stable and right foot wound appears smaller. 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 On the left she is using Hydrofera Blue topical antibiotic under a total contact cast, on the right plantar heel gentamicin Aquacel Ag. According to our intake nurse both are making slow but steady improvements. 5/15; patient presents for follow-up. We have been doing Hydrofera Blue with antibiotic ointment under the total contact cast on the left and antibiotic ointment with Aquacel Ag on the right. Unfortunately the left foot wound has declined in size and appearance. We also do not have the total contact cast available in office today. Patient denies signs of infection. 5/22; patient presents for follow-up. We have been doing Hydrofera Blue and antibiotic ointment to the left foot and Aquacel Ag with antibiotic ointment to the right foot. We took a break from the cast since we did not have it placed at last week. Wounds look stable if not slightly improved. 03-27-2023 patient presents today she has had this For Most 2 Weeks Because She Was in the Hospital and They Did Not T It off. Again That Is Really Not ake Something That She Could Control but Nonetheless We Are Seeing Her at  This Point Today for Reevaluation. We Do Want to Go and See about Reapply the Cast. Again We Had All Set Everything up to Go Ahead and Do This When We Went to Get the Cast Only to Realize That Not Had Come in and We Were out at the Moment. With That Being York Spaniel We Will Get Have T Get the Exam before We Can Reapply the T Contact Casting Going Forward. o otal 6/12; patient presents for follow-up. She has been using Hydrofera Blue to the left foot wound and Aquacel Ag to the right foot wound. No cast was available last week placed on the patient so she has been offloading with her surgical shoe. She has no issues or complaints today. 6/19; patient presents for follow-up. We have been using Hydrofera Blue to the left foot wound under the total contact cast and Aquacel Ag to the right wound. She has no issues or complaints today. The wounds are smaller. 6/26; patient presents for follow-up. We have been using Hydrofera Blue to the left foot wound under the total contact cast and Aquacel Ag to the right foot wound. Wounds are smaller. Patient has no issues or complaints today. Electronic Signature(s) Signed: 04/19/2023 4:25:37 PM By: Geralyn Corwin DO Entered By: Geralyn Corwin on 04/19/2023 12:40:46 -------------------------------------------------------------------------------- Physical Exam Details Patient Name:  Date of Service: CURLEY, FAYETTE 04/19/2023 11:00 A M Medical Record Number: [id number] Patient Account Number: [id number] Date of Birth/Sex: Treating RN: Dec 01, 1975 (47 y.o. Skip Mayer Primary Care Provider: [name] Other Clinician: Referring Provider: Treating Provider/Extender: Chapman Moss in Treatment: [address] Constitutional . Cardiovascular LIBBEY, DUCE A ([id number]) [id number] Page 10 of 23 . Psychiatric . Notes Right foot: T the plantar heel there is an incision site with increased depth, No probing to bone.  T the opening there is slough and granulation tissue Left foot: o o T the medial aspect there is an open wound with granulation tissue, Callus and slough build up. No overt signs of infection to any of the wound beds. o Electronic Signature(s) Signed: 04/19/2023 4:25:37 PM By: Geralyn Corwin DO Entered By: Geralyn Corwin on 04/19/2023 12:41:48 -------------------------------------------------------------------------------- Physician Orders Details Patient Name: Date of Service: [name] 04/19/2023 11:00 A M Medical Record Number: [id number] Patient Account Number: [id number] Date of Birth/Sex: Treating RN: [birth year] (47 y.o. Skip Mayer Primary Care Provider: [name] Other Clinician: Referring Provider: Treating Provider/Extender: Chapman Moss in Treatment: 68 Verbal / Phone Orders: Yes Clinician: Huel Coventry Read Back and Verified: Yes Diagnosis Coding Follow-up Appointments Return Appointment in 1 week. Nurse Visit as needed Bathing/ Shower/ Hygiene May shower with wound dressing protected with water repellent cover or cast protector. No tub bath. Anesthetic (Use 'Patient Medications' Section for Anesthetic Order Entry) Lidocaine applied to wound bed Edema Control - Lymphedema / Segmental Compressive Device / Other Elevate, Exercise Daily and A void Standing for Long Periods of Time. Elevate legs to the level of the heart and pump ankles as often as possible Elevate leg(s) parallel to the floor when sitting. Off-Loading Total Contact Cast to Left Lower Extremity - tcc applied left leg Open toe surgical shoe - Heel off loader - Right foot- Other: - keep pressure off of feet. Additional Orders / Instructions Follow Nutritious Diet and Increase Protein Intake Other: - Stay off of foot, wheelchair only Wound Treatment Wound #11 - Foot Wound Laterality: Plantar, Right Cleanser: Wound Cleanser 3 x Per Week/30 Days Discharge  Instructions: Wash your hands with soap and water. Remove old dressing, discard into plastic bag and place into trash. Cleanse the wound with Wound Cleanser prior to applying a clean dressing using gauze sponges, not tissues or cotton balls. Do not scrub or use excessive force. Pat dry using gauze sponges, not tissue or cotton balls. Prim Dressing: Aquacel Extra Hydrofiber Dressing, 2x2 (in/in) ary 3 x Per Week/30 Days Secondary Dressing: ABD Pad 5x9 (in/in) (Generic) 3 x Per Week/30 Days Discharge Instructions: Cover with ABD pad Secured With: Medipore T - 30M Medipore H Soft Cloth Surgical T ape ape, 2x2 (in/yd) 3 x Per Week/30 Days ARIAUNNA, LONGSWORTH A ([id number]) [id number].pdf Page 11 of 23 Secured With: [employer] or Non-Sterile 6-ply 4.5x4 (yd/yd) 3 x Per Week/30 Days Discharge Instructions: Apply Kerlix as directed Wound #12 - Foot Wound Laterality: Plantar, Left, Medial Cleanser: Wound Cleanser 1 x Per Week/30 Days Discharge Instructions: Wash your hands with soap and water. Remove old dressing, discard into plastic bag and place into trash. Cleanse the wound with Wound Cleanser prior to applying a clean dressing using gauze sponges, not tissues or cotton balls. Do not scrub or use excessive force. Pat dry using gauze sponges, not tissue or cotton balls. Prim Dressing: Hydrofera Blue Ready Transfer Foam, 2.5x2.5 (in/in) 1 x Per Week/30 Days ary  Discharge Instructions: Apply Hydrofera Blue Ready to wound bed as directed Secondary Dressing: heel cup 1 x Per Week/30 Days Discharge Instructions: x2 for heel and front of foot Secured With: Medipore T - 23M Medipore H Soft Cloth Surgical T ape ape, 2x2 (in/yd) 1 x Per Week/30 Days Secured With: [employer] or Non-Sterile 6-ply 4.5x4 (yd/yd) 1 x Per Week/30 Days Discharge Instructions: Apply Kerlix as directed Electronic Signature(s) Signed: 04/19/2023 4:25:37 PM By: Geralyn Corwin DO Entered By:  Geralyn Corwin on 04/19/2023 12:49:11 -------------------------------------------------------------------------------- Problem List Details Patient Name: Date of Service: [name] 04/19/2023 11:00 A M Medical Record Number: [id number] Patient Account Number: [id number] Date of Birth/Sex: Treating RN: September 16, 1976 (47 y.o. 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 03/16/2022 No Yes E11.621 Type 2 diabetes mellitus with foot ulcer 03/16/2022 No Yes E11.42 Type 2 diabetes mellitus with diabetic polyneuropathy 03/16/2022 No Yes L97.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 03/16/2022 No Yes of skin Klinck, Evana A ([id number]) [phone].pdf Page 12 of 23 Z79.4 Long term (current) use of insulin 02/22/2023 No Yes Inactive Problems Resolved Problems Electronic Signature(s) Signed: 04/19/2023 4:25:37 PM By: Geralyn Corwin DO Entered By: Geralyn Corwin on 04/19/2023 12:39:29 -------------------------------------------------------------------------------- Progress Note Details Patient Name: Date of Service: [name] 04/19/2023 11:00 A M Medical Record Number: [id number] Patient Account Number: [id number] Date of Birth/Sex: Treating RN: August 12, 1976 (47 y.o. Skip Mayer Primary Care Provider: [name] Other Clinician: Referring Provider: Treating Provider/Extender: Chapman Moss in Treatment: 34 Subjective Chief Complaint Information obtained from Patient 03/19/2021; patient  referred by Dr. Excell Seltzer who has been looking after her left foot for quite a period of time for review of a nonhealing area in the left midfoot 03/12/2022; bilateral feet wounds and right lower extremity wound. History of Present Illness (HPI) 01/18/18-She is here for initial evaluation of the left great toe ulcer. She is a poor historian in regards to timeframe in detail. She states approximately 4 weeks ago she lacerated her toe on something in the house. She followed up with her primary care who placed her on Bactrim and ultimately a second dose of Bactrim prior to coming to wound clinic. She states she has been treating the toe with peroxide, Betadine and a Band-Aid. She did not check her blood sugar this morning but checked it yesterday morning it was 327; she is unaware of a recent A1c and there are no current records. She saw Dr. she would've orthopedics last week for an old injury to the left ankle, she states he did not see her toe, nor did she bring it to his attention. She smokes approximately 1 pack cigarettes a day. Her social situation is concerning, she arrives this morning with her mother who appears extremely intoxicated/under the influence; her mother was asked to leave the room and be monitored by the patient's grandmother. The patient's aunt then accompanied the patient and the room throughout the rest of the appointment. We had a lengthy discussion regarding the deleterious effects of uncontrolled hyperglycemia and smoking as it relates to wound healing and overall health. She was strongly encouraged to  decrease her smoking and get her diabetes under better control. She states she is currently on a diet and has cut down her [facility] consumption. The left toe is erythematous, macerated and slightly edematous with malodor present. The edema in her left foot is below her baseline, there is no erythema streaking. We will treat her with Santyl, doxycycline; we have ordered and xray,  culture and provided a Peg assist surgical shoe and cultured the wound. 01/25/18-She is here in follow-up evaluation for a left great toe ulcer and presents with an abscess to her suprapubic area. She states her blood sugars remain elevated, feeling "sick" and if levels are below 250, but she is trying. She has made no attempt to decrease her smoking stating that we "can't take away her food in her cigarettes". She has been compliant with offloading using the PEG assist you. She is using Santyl daily. the culture obtained last week grew staph aureus and Enterococcus faecalis; continues on the doxycycline and Augmentin was added on Monday. The suprapubic area has erythema, no femoral variation, purple discoloration, minimal induration, was accessed with a cotton tip applicator with sanguinopurulent drainage, this was cultured, I suspect the current antibiotic treatment will cover and we will not add anything to her current treatment plan. She was advised to go to urgent care or ER with any change in redness, induration or fever. 02/01/18-She is here in follow-up evaluation for left great toe ulcers and a new abdominal abscess from last week. She was able to use packing until earlier this week, where she "forgot it was there". She states she was feeling ill with GI symptoms last week and was not taking her antibiotic. She states her glucose levels have been predominantly less than 200, with occasional levels between 200-250. She thinks this was contributing to her GI symptoms as they have resolved without intervention. There continues to be significant laceration to left toe, otherwise it clinically looks stable/improved. There is now less superficial opening to the lateral aspect of the great toe that was residual blister. We will transition to [facility] to all wounds, she will continue her Augmentin. If there is no change or deterioration next week for reculture. 02/08/18-She is here in follow-up  evaluation for left great toe ulcer and abdominal ulcer. There is an improvement in both wounds. She has been wrapping her left toe with coban, not by our direction, which has created an area of discoloration to the medial aspect; she has been advised to NOT use coban secondary to her neuropathy. She states her glucose levels have been high over this last week ranging from 200-350, she continues to smoke. She admits to being less compliant with her offloading shoe. We will continue with same treatment plan and she will follow-up next week. 02/15/18-She is here in follow-up evaluation for left great toe ulcer and abdominal ulcer. The abdominal ulcer is epithelialized. The left great toe ulcer is improved and all injury from last week using the Coban wrap is resolved, the lateral ulcer is healed. She admits to noncompliance with wearing offloading shoe and admits to glucose levels being greater than 300 most of the week. She continues to smoke and expresses no desire to quit. There is one area medially that probes deeper than it has historically, erythema to the toe and dorsal foot has consistently waxed and waned. There is no overt signs of cellulitis or infection but we will culture the wound for any occult infection given the new area of depth  and erythema. We will hold off on sensitivities for initiation of antibiotic therapy. 02/22/18-She is here in follow up evaluation for left great toe ulcer. There is overall significant improvement in both wound appearance, erythema and edema with changes made last week. She was not initiated on antibiotic therapy. Culture obtained last week showed oxacillin sensitive staph aureus, sensitive to PREEYA, CLECKLEY A ([id number]) [phone].pdf Page 13 of 23 clindamycin. Clindamycin has been called into the pharmacy but she has been instructed to hold off on initiation secondary to overall clinical improvement and her history of antibiotic  intolerance. She has been instructed to contact the clinic with any noted changes/deterioration and the wound, erythema, edema and/or pain. She will follow-up next week. She continues to smoke and her glucose levels remain elevated >250; she admits to compliance with offloading shoe 03/01/18 on evaluation today patient appears to be doing fairly well in regard to her left first toe ulcer. She has been tolerating the dressing changes with the [facility] Dressing without complication and overall this has definitely showed signs of improvement according to records as well is what the patient tells me today. I'm very pleased in that regard. She is having no pain today 03/08/18 She is here for follow up evaluation of a left great toe ulcer. She remains non-compliant with glucose control and smoking cessation; glucose levels consistently >200. She states that she got new shoe inserts/peg assist. She admits to compliance with offloading. Since my last evaluation there is significant improvement. We will switch to prisma at this time and she will follow up next week. She is noted to be tachycardic at this appointment, heart rate 120s; she has a history of heart rate 70-130 according to our records. She admits to extreme agitation r/t personal issues; she was advised to monitor her heartrate and contact her physician if it does not return to a more normal range (<100). She takes cardizem twice daily. 03/15/18-She is here in follow-up evaluation for left great toe ulcer. She remains noncompliant with glucose control and smoking cessation. She admits to compliance with wearing offloading shoe. The ulcer is improved/stable and we will continue with the same treatment plan and she will follow-up next week 03/22/18-She is here for evaluation for left great toe ulcer. There continues to be significant improvement despite recurrent hyperglycemia (over 500 yesterday) and she continues to smoke. She has been compliant  with offloading and we will continue with same treatment plan and she will follow-up next week. 03/29/18-She is here for evaluation for left great toe ulcer. Despite continuing to smoke and uncontrolled diabetes she continues to improve. She is compliant with offloading shoe. We will continue with the same treatment plan and she will follow-up next week 04/05/18- She is here in follow up evaluation for a left great toe ulcer; she presents with small pustule to left fifth toe (resembles ant bite). She admits to compliance with wearing offloading shoe; continues to smoke or have uncontrolled blood glucose control. There is more callus than usual with evidence of bleeding; she denies known trauma. 04/12/18-She is here for evaluation of left great toe ulcer. Despite noncompliance with glycemic control and smoking she continues to make improvement. She continues to wear offloading shoe. The pustule, that was identified last week, to the left fifth toe is resolved. She will follow-up in 2 weeks 05/03/18-she is seen in follow-up evaluation for a left great toe ulcer. She is compliant with offloading, otherwise noncompliant with glycemic control and smoking. She has plateaued and  there is minimal improvement noted. We will transition to [facility], replaced the insert to her surgical shoe and she will follow-up in one week 05/10/18- She is here in follow up evaluation for a left great toe ulcer. It appears stable despite measurement change. We will continue with same treatment plan and follow up next week. 05/24/18-She is seen in follow-up evaluation for a left great toe ulcer. She remains compliant with offloading, has made significant improvement in her diet, decreasing the amount of sugar/soda. She said her recent A1c was 10.9 which is lower than. She did see a diabetic nutritionist/educator yesterday. She continues to smoke. We will continue with the same treatment plan and she'll follow-up next  week. 05/31/18- She is seen in follow-up evaluation for left great toe ulcer. She continues to remain compliant with offloading, continues to make improvement in her diet, increasing her water and decreasing the amount of sugar/soda. She does continue to smoke with no desire to quit. We will apply Prisma to the depth and Hydrofera Blue over. We have not received insurance authorization for oasis. She will follow up next week. 06/07/18-She is seen in follow-up evaluation for left great toe ulcer. It has stalled according to today's measurements although base appears stable. She says she saw a diabetic educator yesterday; her average blood sugars are less than 300 which is an improvement for her. She continues to smoke and states "that's my next step" She continues with water over soda. We will order for xray, culture and reinstate ace wrap compression prior to placing apligraf for next week. She is voicing no complaints or concerns. Her dressing will change to iodoflex over the next week in preparation for apligraf. 06/14/18-She is seen in follow-up evaluation for left great toe ulcer. Plain film x-ray performed last week was negative for osteomyelitis. Wound culture obtained last week grew strep B and OSSA; she is initiated on keflex and cefdinir today; there is erythema to the toe which could be from ace wrap compression, she has a history of wrapping too tight and has has been encouraged to maintain ace wraps that we place today. We will hold off on application of apligraf today, will apply next week after antibiotic therapy has been initiated. She admits today that she has resumed taking a shower with her foot/toe submerged in water, she has been reminded to keep foot/toe out of the bath water. She will be seen in follow up next week 06/21/18-she is seen in follow-up evaluation for left great toe ulcer. She is tolerating antibiotic therapy with no GI disturbance. The wound is stable. Apligraf was applied  today. She has been decreasing her smoking, only had 4 cigarettes yesterday and 1 today. She continues being more compliant in diabetic diet. She will follow-up next week for evaluation of site, if stable will remove at 2 weeks. 06/28/18- She is here in follow up evalution. Apligraf was placed last week, she states the dressing fell off on Tuesday and she was dressing with hydrofera blue. She is healed and will be discharged from the clinic today. She has been instructed to continue with smoking cessation, continue monitoring glucose levels, offloading for an additional 4 weeks and continue with hydrofera blue for additional two weeks for any possible microscopic opening. Readmission: 08/07/18 on evaluation today patient presents for reevaluation concerning the ulcer of her right great toe. She was previously discharged on 06/28/18 healed. Nonetheless she states that this began to show signs of drainage she subsequently went to her primary  care provider. Subsequently an x-ray was performed on 08/01/18 which was negative. The patient was also placed on antibiotics at that time. Fortunately they should have been effective for the infection. Nonetheless she's been experiencing some improvement but still has a lot of drainage coming from the wound itself. 08/14/18 on evaluation today patient's wound actually does show signs of improvement in regard to the erythema at this point. She has completed the antibiotics. With that being said we did discuss the possibility of placing her in a total contact cast as of today although I think that I may want to give this just a little bit more time to ensure nothing recurrence as far as her infection is concerned. I do not want to put in the cast and risk infection at that time if things are not completely resolved. With that being said she is gonna require some debridement today. 08/21/18 on evaluation today patient actually appears to be doing okay in regard to her toe  ulcer. She's been tolerating the dressing changes without complication. With that being said it does appear that she is ready and in fact I think it's appropriate for [place] to go ahead and initiate the total contact cast today. Nonetheless she will require some sharp debridement to prepare the wound for application. Overall I feel like things have been progressing well but we do need to do something to get this to close more readily. 08/24/18 patient seen today for reevaluation after having had the total contact cast applied on Tuesday. She seems to have done very well the wound appears to be doing great and overall I'm pleased with the progress that she's made. There were no abnormal areas of rubbing from the cast on her lower extremity. 08/30/18 on evaluation today patient actually appears to be completely healed in regard to her plantar toe ulcer. She tells me at this point she's been having a lot of issues with the cast. She almost fell a couple of times the state shall the step of her dog a couple times as well. This is been a very frustrating process for her other nonetheless she has completely healed the wound which is excellent news. Overall there does not appear to be the evidence of infection at this time which is great news. 09/11/18 evaluation today patient presents for follow-up concerning her great toe ulcer on the left which has unfortunately reopened since I last saw her which was only a couple of weeks ago. Unfortunately she was not able to get in to get the shoe and potentially the AFO that's gonna be necessary due to her left foot drop. She continues with offloading shoe but this is not enough to prevent her from reopening it appears. When we last had her in the total contact cast she did well from a healing standpoint but unfortunately the wound reopened as soon as she came out of the cast within just a couple of weeks. 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 This is also part of the reason why she falls on a regular basis. I just do not believe that is gonna be safe for [place] to reinitiate the total contact cast as last time we had this on she fell 3 times one week which is definitely not  normal for her. 09/18/18 upon evaluation today the patient actually appears to be doing about the same in regard to her toe ulcer. She did not contact [employer] as I asked her to even though I had given her the prescription. In fact she actually states that she has no idea where the prescription is. She did apparently call [employer] and they told her that all she needed to do was bring the prescription in order to be able to be seen and work on getting the AFO for her left foot. With all that being said she still does not have an appointment and I'm not sure were things stand that regard. I will give her a new prescription today in order to contact them to get this set up. 09/25/18 on evaluation today patient actually appears to be doing about the same in regard to her toes ulcer. She does have a small areas which seems to have a lot of callous buildup around the edge of the wound which is going to need sharp debridement today. She still is waiting to be scheduled for evaluation with REIKO, VINJE A ([id number]) [id number] Page 14 of [address] [facility] for possibility of an AFO. She states there supposed to call her tomorrow to get this set up. Unfortunately it does appear that her foot specifically the toe area is showing signs of erythema. There does not appear to be any systemic infection which is in these good news. 10/02/18 on evaluation today patient actually appears to be doing about the same in regard to her toe ulcer. This really has not done too well although it's  not significantly larger it's also not significantly smaller. She has been tolerating the dressing changes without complication. She actually has her appointment with [employer] and [facility] tomorrow to hopefully be measured for obtaining and AFO splint. I think this would be helpful preventing this from reoccurring. We had contemplated starting the cast this week although to be honest I am reluctant to do that as she's been having nausea, vomiting, and seizure activity over the past three days. She has a history of seizures and have been told is nothing that can be done for these. With that being said I do believe that along with the seizures have the nausea vomiting which upon further questioning doesn't seem to be the normal for her and makes me concerned for the possibility of infection or something else going on. I discussed this with the patient and her mother during the office visit today. I do not feel the wound is effective but maybe something else. The responses this was "this just happens to her at times and we don't know why". They did not seem to be interested in going to the hospital to have this checked out further. 10/09/18 on evaluation today patient presents for follow-up concerning her ongoing toe ulcer. She has been tolerating the dressing changes without complication. Fortunately there does not appear to be any evidence of infection which is great news however I do think that the patient would benefit from going ahead for with the total contact cast. She's actually in a wheelchair today she tells me that she will use her walker if we initiate the cast. I was very specific about the fact that if we were gonna do the cast I wanted to make sure that she was using the walker in order to prevent any falls. She tells me she does not have stairs that she has to traverse on a regular basis at  her home. She has not had any seizures since last week again that something that happens to her often  she tells me she did talk to [employer] and they said that it may take up to three weeks to get the brace approved for her. Hopefully that will not take that long but nonetheless in the meantime I do think the cast could be of benefit. 10/12/18 on evaluation today patient appears to be doing rather well in regard to her toe ulcer. It's just been a few days and already this is significantly improved both as far as overall appearance and size. Fortunately there's no sign of infection. She is here for her first obligatory cast change. 10/19/18 Seen today for follow up and management of left great toe ulcer. Wound continues to show improvement. Noted small open area with seroussang drainage with palpation. Denies any increased pain or recent fevers during visit. She will continue calcium alginate with offloading shoe. Denies any questions or concerns during visit. 10/26/18 on evaluation today patient appears to be doing about the same as when I last saw her in regard to her wound bed. Fortunately there does not appear to be any signs of infection. Unfortunately she continues to have a breakdown in regard to the toe region any time that she is not in the cast. It takes almost no time at all for this to happen. Nonetheless she still has not heard anything from the brace being made by [employer] as to when exactly this will be available to her. Fortunately there is no signs of infection at this time. 10/30/18 on evaluation today patient presents for application of the total contact cast as we just received him this morning. Fortunately we are gonna be able to apply this to her today which is great news. She continues to have no significant pain which is good news. Overall I do feel like things have been improving while she was the cast is when she doesn't have a cast that things get worse. She still has not really heard anything from [employer] regarding her brace. 11/02/18 upon evaluation today patient's wound already  appears to be doing significantly better which is good news. Fortunately there does not appear to be any signs of infection also good news. Overall I do think the total contact cast as before is helping to heal this area unfortunately it's just not gonna likely keep the area closed and healed without her getting her brace at least. Again the foot drop is a significant issue for her. 11/09/18 on evaluation today patient appears to be doing excellent in regard to her toe ulcer which in fact is completely healed. Fortunately we finally got the situation squared away with the paperwork which was needed to proceed with getting her brace approved by Medicaid. I have filled that out unfortunately that information has been sent to the orthopedic office that I worked at 2 1/2 years ago and not tired Current wound care measures. Fortunately she seems to be doing very well at this time. 11/23/18 on evaluation today patient appears to be doing More Poorly Compared to Last Time I Saw Her. At [facility] She Had Completely Healed. Currently she is continuing to have issues with reopening. She states that she just found out that the brace was approved through Medicaid now she just has to go get measured in order to have this fitted for her and then made. Subsequently she does not have an appointment for this yet that is going to  complicate things we obviously cannot put her back in the cast if we do not have everything measured because they're not gonna be able to measure her foot while she is in the cast. 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 Obviously this is unfortunate and does have me somewhat worried as well. 11/30/18 on evaluation today patient's toe ulcer actually appears to be doing fairly well. The good news is she will be getting her brace in the shoes next week on Wednesday. Hopefully we will be able to get  this to heal without having to go back in the cast however she may need the cast in order to get the wound completely heal and then go from there. Fortunately there's no signs of infection at this time. 12/07/18 on evaluation today patient fortunately did receive her brace and she states she could tell this definitely makes her walk better. With that being said she's been having issues with her toe where she noticed yesterday there was a lot of tissue that was loosing off this appears to be much larger than what it was previous. She also states that her leg has been read putting much across the top of her foot just about the ankle although this seems to be receiving somewhat. The total area is still red and appears to be someone infected as best I can tell. She is previously taken Bactrim and that may be a good option for her today as well. We are gonna see what I wound culture shows as well and I think that this is definitely appropriate. With that being said outside of the culture I still need to initiate something in the interim and that's what I'm gonna go ahead and select Bactrim is a good option for her. 12/14/18 on evaluation today patient appears to be doing better in regard to her left great toe ulcer as compared to last week's evaluation. There's still some erythema although this is significantly improved which is excellent news. Overall I do believe that she is making good progress is still gonna take some time before she is where I would like her to be from the standpoint of being able to place her back into the total contact cast. Hopefully we will be where we need to be by next week. 12/21/18 on evaluation today patient actually appears to be doing poorly in regard to her toe ulcer. She's been tolerating the dressing changes without complication. Fortunately there's no signs of systemic infection although she does have a lot of drainage from the toe ulcer and this does seem to be  causing some issues at this point. She does have erythema on the distal portion of her toe that appears to be likely cellulitis. 12/28/18 on evaluation today patient actually appears to be doing a little better in my pinion in regard to her toe ulcer. With that being said she still does have some evidence of infection at this time and for her culture she had both E. coli as well as enterococcus as organisms noted on evaluation. For that reason I think that though the Keflex likely has treated the E. coli rather well this has really done nothing for the enterococcus. We are going to have to initiate treatment for this specifically. 01/04/19 on evaluation today patient's toe actually appears to be doing better from the standpoint of infection. She currently would like to  see about putting the cash back on I think that this is appropriate as long as she takes care of it and keeps it from getting wet. She is gonna have some drainage we can definitely pass this up with Drawtex and alginate to try to prevent as much drainage as possible from causing the problems. With that being said I do want to at least try her with the cast between now and Tuesday. If there any issues we can't continue to use it then I will discontinue the use of the cast at that point. 01/08/19 on evaluation today patient actually appears to be doing very well as far as her foot ulcer specifically the great toe on the left is concerned. She did have an area of rubbing on the medial aspect of her left ankle which again is from the cast. Fortunately there's no signs of infection at this point in this appears to be a very slight skin breakdown. The patient tells me she felt it rubbing but didn't think it was that bad. Fortunately there is no signs of active infection at this time which is good news. No fevers, chills, nausea, or vomiting noted at this time. 01/15/19 on evaluation today patient actually appears to be doing well in regard to her  toe ulcer. Again as previous she seems to do well and she has the cast on which indicates to me that during the time she doesn't have a cast on she's putting way too much pressure on this region. Obviously I think that's gonna be an issue as with the current national emergency concerning the Covid-19 Virus it has been recommended that we discontinue the use of total contact casting by the chief medical officer of our company, Dr. Maurine Minister. The reasoning is that if a patient becomes sick and cannot come into have the cast removed they could Alred, Alyric A ([id number]) [phone].pdf Page 15 of 23 not just leave this on for an additional two weeks. Obviously the hospitals also do not want to receive patient's who are sick into the emergency department to potentially contaminate the region and spread the Covid-19 Virus among other sick individuals within the hospital system. Therefore at this point we are suspending the use of total contact cast until the current emergency subsides. This was all discussed with the patient today as well. 01/22/19 on evaluation today patient's wound on her left great toe appears to be doing slightly worse than previously noted last week. She tells me that she has been on this quite a bit in fact she tells me she's been awake for 38 straight hours. This is due to the fact that she's having to care for grandparents because nobody else will. She has been taking care of them for five the last seven days since I've seen her they both have dementia his is from a stroke and her grandmother's was progressive. Nonetheless she states even her mom who knows her condition and situation has only help two of those days to take care of them she's been taking care of the rest. Fortunately there does not appear to be any signs of active infection in regard to her toe at this point although obviously it doesn't look as good as it did previous. I think this is directly  related to her not taking off the pressure and friction by way of taking things easy. Though I completely understand what's going on. 01/29/19 on evaluation today patient's tools are actually appears to be showing some signs  of improvement today compared to last week's evaluation as far as not necessarily the overall size of the wound but the fact that she has some new skin growth in between the two ends of the wound opening. Overall I feel like she has done well she states that she had a family member give her what sounds to be a CAM walker boot which has been helpful as well. 02/05/19 on evaluation today patient's wound bed actually appears to be doing significantly better in regard to her overall appearance of the size of the wound. With that being said she is still having an issue with offloading efficiently enough to get this to close. Apparently there is some signs of infection at this point as well unfortunately. Previously she's done well of Augmentin I really do not see anything that needs to be culture currently but there theme and cellulitis of the foot that I'm seeing I'm gonna go ahead and place her on an antibiotic today to try to help clear this up. 02/12/2019 on evaluation today patient actually appears to be doing poorly in regard to her overall wound status. She tells me she has been using her offloading shoe but actually comes in today wearing her tennis shoe with the AFO brace. Again as I previously discussed with her this is really not sufficient to allow the area to heal appropriately. Nonetheless she continues to be somewhat noncompliant and I do wonder based on what she has told my nurse in the past as to whether or not a good portion of this noncompliance may be recreational drug and alcohol related. She has had a history of heroin overdose and this was fairly recently in the past couple of months that have been seeing her. Nonetheless overall I feel like her wound looks  significantly worse today compared to what it was previous. She still has significant erythema despite the Augmentin I am not sure that this is an appropriate medication for her infection I am also concerned that the infection may have gone down into her bone. 02/19/19 on evaluation today patient actually appears to be doing about the same in regard to her toe ulcer. Unfortunately she continues to show signs of bone exposure and infection at this point. There does not appear to be any evidence of worsening of the infection but I'm also not really sure that it's getting significantly better. She is on the Augmentin which should be sufficient for the Staphylococcus aureus infection that she has at this point. With that being said she may need IV antibiotics to more appropriately treat this. We did have a discussion today about hyperbaric option therapy. 02/28/19 on evaluation today patient actually appears to be doing much worse in regard to the wound on her left great toe as compared to even my previous evaluation last week. Unfortunately this seems to be training in a pretty poor direction. Her toe was actually now starting to angle laterally and I can actually see the entire joint area of the proximal portion of the digit where is the distal portion of the digit again is no longer even in contact with the joint line. Unfortunately there's a lot more necrotic tissue around the edge and the toe appears to be showing signs of becoming gangrenous in my pinion. I'm very concerned about were things stand at this point. She did see infectious disease and they are planning to send in a prescription for Sivextro for her and apparently this has been approved. With that being  said I don't think she should avoid taking this but at the same time I'm not sure that it's gonna be sufficient to save her toe at this point. She tells me that she still having to care for grandparents which I think is putting quite a bit of  strain on her foot and specifically the total area and has caused this to break down even to a greater degree than would've otherwise been expected. 03/05/19 on evaluation today patient actually appears to be doing quite well in regard to her toe all things considering. She still has bone exposed but there appears to be much less your thing on overall the appearance of the wound and the toe itself is dramatically improved. She still does have some issues currently obviously with infection she did see vascular as well and there concerned that her blood flow to the toad. For that reason they are setting up for an angiogram next week. 03/14/19 on evaluation today patient appears to be doing very poor in regard to her toe and specifically in regard to the ulceration and the fact that she's starting to notice the toe was leaning even more towards the lateral aspect and the complete joint is visible on the proximal aspect of the joint. Nonetheless she's also noted a significant odor and the tip of the toe is turning more dark and necrotic appearing. Overall I think she is getting worse not better as far as this is concerned. For that reason I am recommending at this point that she likely needs to be seen for likely amputation. READMISSION 03/19/2021 This is a patient that we cared for in this clinic for a prolonged period of time in 2019 and 2020 with a left foot and left first toe wound. 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 Excell Seltzer. In December 2021 she had an ulcer on her right great toe as well as the fourth and fifth toes. 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 She also had an angiogram at that time and underwent angioplasty of the right anterior tibial artery. In any case she claims that the wound on the right foot is closed I did not look at  this today which was probably an oversight although I think that should be done next week. After her surgery she developed a dehiscence but I do not see any follow-up of this. According to Dr. Bernette Redbird last review that she was out of the area being cared for by another physician but recently came back to his attention. 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 A culture of this area showed E. coli apparently before she came back to see Dr. Excell Seltzer she was supposed to be receiving antibiotics but she did not really take them. Nor is she offloading this area at all. Finally her last hemoglobin A1c listed in epic was in March 2022 at 14.1 she says things are a lot better since then although I am not sure. 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 She had this wound at the time. During this admission she had E. coli sepsis a TEE was negative. Past medical history is extensive and includes type 2 diabetes with peripheral neuropathy cardiomyopathy with an ejection fraction of 33%, hypertension, hyperlipidemia chronic renal failure stage III history  of substance abuse with cocaine although she claims to be clean now verified by her mother. She is still a heavy cigarette smoker. She has a history of bipolar disorder seizure disorder ABI in our clinic was 1.05 6/1; left midfoot in the setting of a TMA done previously. Round circular wound with a "knuckle" of protruding tissue. The problem is that the knuckle was not attached to any of the surrounding granulation and this probed proximally widely I removed a large portion of this tissue. This wound goes with considerable undermining laterally. I do not feel any bone there was no purulence but this is a deep wound. 6/8; in spite of the debridement I did last week. She arrives with a wound looking exactly the same. A protruding "knuckle" of tissue nonadherent to  most of the surrounding tissue. There is considerable depth around this from 6-12 o'clock at 2.7 cm and undermining of 1 cm. This does not look overtly infected and the x- ray I did last week was negative for any osseous abnormalities. We have been using silver collagen 6/15; deep tissue culture I did last week showed moderate staph aureus and moderate Pseudomonas. This will definitely require prolonged antibiotic therapy. The pathology on the protuberant area was negative for malignancy fungus etc. 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 We have been using silver collagen. I am going to be prescribing Levaquin for 2 weeks. Her CT scan of the foot is down for 7/5 6/22; CT scan of the foot on 7 5. She says she has hardware in the left leg from her previous fracture. She is on the Levaquin for the deep tissue culture I did that showed methicillin sensitive staph aureus and Pseudomonas. I gave her a 2-week supply and she will have another week. She arrives in clinic today with the same protuberant tissue however this is nonadherent to the tissue surrounding it. I am really at a loss to explain this unless there is underlying deep tissue infection 6/29; patient presents for 1 week follow-up. She has been using collagen to the wound bed. She reports taking her antibiotics as prescribed.She has no complaints or issues today. She denies signs of infection. 7/6; patient presents for one week followup. She has been using collagen to the wound bed. She states she is taking Levaquin however at times she is not able ANDRIKA, PERAZA A ([id number]) [id number] Page 16 of 23 to keep it down. She denies signs of infection. 7/13; patient presents for 1 week follow-up. She has been using silver alginate to the wound bed. She still has nausea when taking her antibiotics. She denies signs of infection. 7/20; patient presents for 1  week follow-up. She has been using silver alginate with gentamicin cream to the wound bed. She denies any issues and has no complaints today. She denies signs of infection. 7/27; patient presents for 1 week follow-up. She continues to use silver alginate with gentamicin cream to the wound bed. She reports starting her antibiotics. She has no issues or complaints. Overall she reports stability to the wound. 8/3; patient presents for 1 week follow-up. She has been using silver alginate with gentamicin cream to the wound bed. She reports completing all antibiotics. She has no issues or complaints today. She denies signs of infection. 8/17; patient presents for 2-week follow-up. He is to use silver alginate to the wound bed. She has no issues or complaints today. She denies signs of infection. She reports her pain  has improved in her foot since last clinic visit 8/24; patient presents for 1 week follow-up. She continues to use silver alginate to the wound bed. She has no issues or complaints. She denies signs of infection. Pain is stable. 9/7; patient presents for follow-up. She missed her last week appointment due to feeling ill. She continues to use silver alginate. She has a new wound to the right lower extremity that is covered in eschar. She states It occurred over the past week and has no idea how it started. She currently denies signs of infection. 9/14; patient presents for follow-up. T the left foot wound she has been using gentamicin cream and silver alginate. T the right lower extremity wound she has o o been keeping this covered and has not obtain Santyl. 9/21; patient presents for follow-up. She reports using gentamicin cream and silver alginate to the left foot and Santyl to the right lower extremity wound. She has no issues or complaints today. She denies signs of infection. 9/28; patient presents for follow-up. She reports a new wound to her right heel. She states this occurred a few  days ago and is progressively gotten worse. She has been trying to clean the area with a Q-tip and Santyl. She reports stability in the other 2 wounds. She has been using gentamicin cream and silver alginate to the left foot and Santyl to the right lower extremity wound. 10/12; patient presents for follow-up. She reports improvement to the wound beds. She is seeing vein and vascular to discuss the potential of a left BKA. She states they are going to do an arteriogram. She continues to use silver alginate with dressing changes to her wounds. 11/2; patient presents for follow-up. She states she has not been doing dressing changes to the wound beds. She states she is not able to offload the areas. She reports chronic pain to her left foot wound. 11/9; patient presents for follow-up. She came in with only socks on. She states she forgot to put on shoes. It is unclear if she is doing any dressing changes. She currently denies systemic signs of infection. 11/16; patient presents for follow-up. She came again only with socks on. She states she does not wear shoes ever. It is unclear if she does dressing changes. She currently denies systemic signs of infection. 11/23; patient presents for follow-up. She wore her shoes today. It still unclear exactly what dressing she is using for each wound but she did states she obtained Dakin's solution and has been using this to the left foot wound. She currently denies signs of infection. 11/30; patient presents for follow-up. She has no issues or complaints today. She currently denies signs of infection. 12/7; patient presents for follow-up. She has no issues or complaints today. She has been using Hydrofera Blue to the right heel wound and Dakin solution to the left foot wound. Her right anterior leg wound is healed. She currently denies signs of infection. 12/14; patient presents for follow-up. She has been using Hydrofera Blue to the right heel and Dakin's to the  left foot wounds. She has no issues or complaints today. She denies signs of infection. 12/21; patient presents for follow-up. She reports using Hydrofera Blue to the right heel and Dakin's to the left foot wound. She denies signs of infection. 12/28; patient presents for follow-up. She continues to use Dakin's to the left foot wound and Hydrofera Blue to the right heel wound. She denies signs of infection. 1/4; patient presents for follow-up.  She has no issues or complaints today. She denies signs of infection. 1/11; patient presents for follow-up. It is unclear if she has been dressing these wounds over the past week. She currently denies signs of infection. 1/18; patient presents for follow-up. She states she has been using Dakin's wet-to-dry dressings to the left foot. She has been using Hydrofera Blue to the right foot foot wound. She states that the anterior right leg wound has reopened and draining serous fluid. She denies signs of infection. 1/25; patient presents for follow-up. She has no issues or complaints today. 2/1; patient presents for follow-up. She has no issues or complaints today. She denies signs of infection. 2/8; patient presents for follow-up. She has lost her surgical shoes. She did not have a dressing to the right heel wound. She currently denies signs of infection. 2/15; patient presents for follow-up. She reports more pain to the right heel today. She denies purulent drainage Or fever/chills 2/22; patient presents for follow-up. She reports taking clindamycin over the past week. She states that she continues to have pain to her right heel. She reports purulent drainage. Readmission 03/16/2022 Ms. Niasia Lanphear is a 47 year old female with a past medical history of type 2 diabetes, osteomyelitis to her feet, chronic systolic heart failure and bipolar disorder that presents to the clinic for bilateral feet wounds and right lower extremity wound. She was last seen in our  clinic on 12/15/2021. At that time she had purulent drainage coming out of her right plantar foot and I recommended she go to the ED. She states she went to [facility] and has been there for the past 3 months. I cannot see the records. She states she had OR debridement and was on several weeks of IV antibiotics while inpatient. Since discharge she has not been taking care of the wound beds. She had nothing on her feet other than socks today. She currently denies signs of infection. 5/31; patient presents for follow-up. She has been using Dakin's wet-to-dry dressings to the wound beds on her feet bilaterally and antibiotic ointment to the right anterior leg wound. She had a wound culture done at last clinic visit that showed moderate Pseudomonas aeruginosa sensitive to ciprofloxacin. She currently denies systemic signs of infection. 6/14; patient presents for follow-up. She received Keystone 5 days ago and has been using this on the wound beds. She states that last week she had to go to the hospital because she had increased warmth and erythema to the right foot. She was started on 2 oral antibiotics. She states she has been taking these. She currently denies systemic signs of infection. She has no issues or complaints today. 6/21; patient presents for follow-up. She states she has been using Keystone antibiotics to the wound beds. She has no issues or complaints today. She denies signs of infection. 6/28; patient presents for follow-up. She has been using Keystone antibiotics to the wound beds. She has no issues or complaints today. 7/12; patient presents for follow-up. Has been using Keystone antibiotics to the wound beds with calcium alginate. She has no issues or complaints today. She LENNIE, DUNNIGAN A ([id number]) [id number] Page 17 of 23 never followed up with her orthopedic surgeon who did the OR debridement to the right foot. We discussed the total contact  cast for the left foot and patient would like to do this next week. 7/19; patient presents for follow-up. She has been using Keystone antibiotics with calcium alginate to the wound beds. She has no  issues or complaints today. Patient is in agreement to do the total contact cast of the left foot today. She knows to return later this week for the obligatory cast change. 05-13-2022 upon evaluation today patient's wound which she has the cast of the left leg actually appears to be doing significantly better. Fortunately I do not see any signs of active infection locally or systemically which is great news and overall I am extremely pleased with where we stand currently. 7/26; patient presents for follow-up. She has a cast in place for the past week. She states it irritated her shin. Other than that she tolerated the cast well. She states she would like a break for 1 week from the cast. We have been using Keystone antibiotic and Aquacel to both wound beds. She denies signs of infection. 8/2; patient presents for follow-up. She has been using Keystone and Aquacel to the wound beds. She denies any issues and has no complaints. She is agreeable to have the cast placed today for the left leg. 06-03-2022 upon evaluation today patient appears to be doing well with regard to her wound she saw some signs of improvement which is great news. Fortunately I do not see any evidence of active infection locally or systemically at this time which is great news. No fevers, chills, nausea, vomiting, or diarrhea. 8/16; patient presents for follow-up. She has no issues or complaints today. We have been using Keystone and Aquacel to the wound beds. The left lower extremity is in a total contact cast. She is tolerated this well. 8/23; patient presents for follow-up. She has had the total contact cast on the left leg for the past week. Unfortunately this has rubbed and broken down the skin to the medial foot. She currently  denies signs of infection. She has been using Keystone antibiotic to the right plantar foot wound. 8/30; patient presents for follow-up. We have held off on the total contact cast for the left leg for the past week. Her wound on the left foot has improved and the previous surrounding breakdown of skin has epithelialized. She has been using Keystone antibiotic to both wound beds. She has no issues or complaints today. She denies signs of infection. 9/6; patient presents for follow-up. She has ordered her's Keystone antibiotic and this is arriving this week. She has been doing Dakin's wet-to-dry dressings to the wound beds. She denies signs of infection. She is agreeable to the total contact cast today. 9/13; patient presents for follow-up. She states that the cast caused her left leg shin to be sore. She would like to take a break from the cast this week. She has been using Keystone antibiotic to the right plantar foot wound. She denies signs of infection. 9/20; patient presents for follow-up. She has been using Keystone antibiotics to the wound beds with calcium alginate to the right foot wound and Hydrofera Blue to the left foot wound. She is agreeable to having the cast placed today. She has been approved for Apligraf and we will order this for next clinic visit. 9/27; patient presents for follow-up. We have been using Keystone antibiotic with Hydrofera Blue to the left foot wound under a total contact cast. T the right o foot wound she has been using Keystone antibiotic and calcium alginate. She declines a total contact cast today. Apligraf is available for placement and she would like to proceed with this. 07-28-2022 upon evaluation today patient appears to be doing well currently in regard to her wound. She  is actually showing signs of significant improvement which is great news. Fortunately I do not see any evidence of active infection locally nor systemically at this time. She has been seeing  Dr. Mikey Bussing and to be honest has been doing very well with the cast. Subsequently she comes in today with a cast on and we did reapply that today as well. She did not really want to she try to talk me out of that but I explained that if she wanted to heal this is really the right way to go. Patient voiced understanding. In regard to her right foot this is actually a lot better compared to the last time I saw her which is also great news. 10/11; patient presents for follow-up. Apligraf and the total contact cast was placed to the left leg at last clinic visit. She states that her right foot wound had burning pain to it with the placement of Apligraf to this area. She has been doing [place] over this area. She denies signs of infection including increased warmth, erythema or purulent drainage. 11/1; 3-week follow-up. The patient fortunately did not have a total contact cast or an Apligraf and on the left foot. She has been using Keystone ABD pads and kerlix and her own running shoes She arrives in clinic today with thick callus and a very poor surface on the left foot on the right nonviable skin subcutaneous tissue and a deep probing hole. 11/15; patient missed her last clinic appointment. She states she has not been dressing the wound beds for the past 2 weeks. She states that at she had a new roommate but is now going back to live with her mother. Apparently its been a distracting 2 weeks. Patient currently denies signs of infection. 11/22; patient presents for follow-up. She states she has been using Keystone antibiotic and Dakin's wet-to-dry dressings to the wound beds. She is agreeable for cast placement today. We had ordered Apligraf however this has not been received by our facility. 11/29; Patient had a total contact cast placed at last clinic visit and she tolerated this well. We were using silver alginate under the cast. Patient's been using Keystone antibiotic with Aquacel to the right  plantar foot wound. She has no issues or complaints today. Apligraf is available for placement today. Patient would like to proceed with this. 12/6; patient presents for follow-up. She had Apligraf placed in standard fashion last clinic visit under the total contact cast to the left lower extremity. She has been using Keystone antibiotic and Aquacel to the right plantar foot wound. She has no issues or complaints today. 12/13; patient presents for follow-up. She has finished 5 Apligraf placements. Was told she would not qualify for more. We have been doing a total contact cast to the left lower extremity. She has been using Keystone antibiotic and Aquacel to the right plantar foot wound. She has no issues or complaints today. 12/20; patient presents for follow-up. We have been using Hydrofera Blue with Keystone antibiotic under a total contact cast of the left lower extremity. She reports using Keystone antibiotic and silver alginate to the right heel wound. She has no issues or complaints today. 12/27; patient presents with a healthy wound on the left midfoot. We have Apligraf to apply that to that more also using a total contact cast. On the right we are using Keystone and silver alginate. She is offloading the right heel with a surgical shoealthough by her admission she is on her feet quite a  bit 1/3; patient presents for follow-up. Apligraf was placed to the wound beds last clinic visit. She was placed in a total contact cast to the left lower extremity. She declines a total contact cast today. She states that her mother is in the hospital and she cannot adequately get around with the cast on. 1/10; patient presents for follow-up. She declined the total contact cast at last clinic visit. Both wounds have declined in appearance. She states that she has been on her feet and not offloading the wound beds. She currently denies signs of infection. 1/17; patient presents for follow-up. She had the total  contact cast along with Apligraf placed last week to the left lower extremity. She tolerated this well. She has been using Aquacel Ag and Keystone antibiotic to the right heel wound. She currently denies signs of infection. She has no issues or complaints today. 1/24; patient presents for follow-up. We have been using Apligraf to the left foot wound along with a total contact cast. She has done well with this. T the right o heel wound she has been using Aquacel Ag and Keystone antibiotic ointment. She has no issues or complaints today. She denies signs of infection. TAMRAH, VICTORINO A ([id number]) [id number] Page 18 of 23 1/31; patient presents for follow-up. We have been using Apligraf to the left foot wound along with the total contact cast. She continues to do well with this. To the right heel we have been using Aquacel Ag and Keystone antibiotic ointment. She has no issues or complaints today. 12-01-2022 upon evaluation patient is seen today on my schedule due to the fact that she unfortunately was in the hospital yesterday. Her cast needed to come off the only reason she is out of the hospital is due to the fact that they would not take it off at the hospital which is somewhat bewildered reading to me to be perfectly honest. I am not certain why this was but either way she was released and then was placed on my schedule today in order to get this off and reapply the total contact casting as appropriate. I do not have an Apligraf for her today it was applied last week and today's actually expired yesterday as there was some scheduling conflicts with her being in the hospital. Nonetheless we do not have that for reapplication today but the good news that she is not draining too much and the Apligraf can go for up to 2 weeks so I am going to go ahead and reapply the total contact casting but we are going to leave the Apligraf in place. 2/14; patient presents for follow-up. T  the left leg she has had the total contact cast and Apligraf for the past week. She has had no issues with this. T the o o right heel she has been using antibiotic ointment and Aquacel Ag. 2/21; patient presents for follow-up. We have been using Apligraf and a total contact cast to the left lower extremity. She is tolerated this well. Unfortunately she is not approved for any more Apligraf per insurance. She has been using antibiotic ointment and Aquacel Ag to the right foot. She has no issues or complaints today. 2/28; patient presents for follow-up. We have been using Hydrofera Blue and antibiotic ointment under the total contact cast to the left lower extremity. He has been using Aquacel Ag with antibiotic ointment to the right plantar foot. She has no issues or complaints today. 3/6; patient presents for follow-up. She did not  obtain her gentamicin ointment. She has been using Aquacel Ag to the right plantar foot wound. We have been using Hydrofera Blue with antibiotic ointment under the total contact cast to the left lower extremity. She has no issues or complaints today. 3/12; patient presents for follow-up. She has been using gentamicin ointment and Aquacel Ag to the right plantar foot wound. We have been using Hydrofera Blue with antibiotic ointment under the total contact cast on the left lower extremity foot wound. She has no issues or complaints today. 3/20; patient presents for follow-up. She has been using gentamicin ointment and Aquacel Ag to the right plantar foot wound. We have been using Hydrofera Blue with antibiotic ointment under the total contact cast to the left lower extremity wound. She has no issues or complaints today. 3/27; patient presents for follow-up. We have been using antibiotic ointment and Aquacel Ag to the right plantar foot wound. We have been using Hydrofera Blue with antibiotic ointment under the total contact cast to the left lower extremity. Both wounds are  smaller. 01-24-2023 upon evaluation today patient actually appears to be doing better compared to last time I saw her. It has been several weeks since I last saw her but nonetheless I think the total contact casting is doing a good job. Fortunately I do not see any evidence of infection or worsening overall which is great news and in general I do believe that she is moving in the appropriate direction. It has been a very long road but nonetheless I feel like she is nearing the end at least in regard to the left foot and even the right foot looks much better to be perfectly honest. 01-31-2023 upon evaluation today patient appears to be doing well currently in regard to her wounds. She has been require some sharp debridement which I think is probably bone both sides to remove a lot of callus that has built up at this point. I discussed that with her today. Also think total contact cast is doing well for the left foot Emina continue that as well. 4/17; patient presents for follow-up. We have been doing Hydrofera Blue with antibiotic ointment under the total contact cast to the left lower extremity and Aquacel Ag with antibiotic ointment to the right foot wound. She has been using her offloading heel shoe here. 4/24; patient presents for follow-up. We have been doing Hydrofera Blue with antibiotic ointment under the total contact cast to the left lower extremity and Aquacel Ag with antibiotic ointment to the right foot wound. She has been using her offloading heel shoe here. Wounds are smaller with slough accumulation. 5/1; patient presents for follow-up. We have been doing Hydrofera Blue with antibiotic ointment under the total contact cast to the left lower extremity and Aquacel Ag with antibiotic ointment to the right foot wound. Left foot wound is stable and right foot wound appears smaller. 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 On the left she is using Hydrofera Blue topical antibiotic under a total  contact cast, on the right plantar heel gentamicin Aquacel Ag. According to our intake nurse both are making slow but steady improvements. 5/15; patient presents for follow-up. We have been doing Hydrofera Blue with antibiotic ointment under the total contact cast on the left and antibiotic ointment with Aquacel Ag on the right. Unfortunately the left foot wound has declined in size and appearance. We also do not have the total contact cast available in office today. Patient denies signs of infection. 5/22;  patient presents for follow-up. We have been doing Hydrofera Blue and antibiotic ointment to the left foot and Aquacel Ag with antibiotic ointment to the right foot. We took a break from the cast since we did not have it placed at last week. Wounds look stable if not slightly improved. 03-27-2023 patient presents today she has had this For Most 2 Weeks Because She Was in the Hospital and They Did Not T It off. Again That Is Really Not ake Something That She Could Control but Nonetheless We Are Seeing Her at This Point Today for Reevaluation. We Do Want to Go and See about Reapply the Cast. Again We Had All Set Everything up to Go Ahead and Do This When We Went to Get the Cast Only to Realize That Not Had Come in and We Were out at the Moment. With That Being [place] We Will Get Have T Get the Exam before We Can Reapply the T Contact Casting Going Forward. o otal 6/12; patient presents for follow-up. She has been using Hydrofera Blue to the left foot wound and Aquacel Ag to the right foot wound. No cast was available last week placed on the patient so she has been offloading with her surgical shoe. She has no issues or complaints today. 6/19; patient presents for follow-up. We have been using Hydrofera Blue to the left foot wound under the total contact cast and Aquacel Ag to the right wound. She has no issues or complaints today. The wounds are smaller. 6/26; patient presents for follow-up. We have  been using Hydrofera Blue to the left foot wound under the total contact cast and Aquacel Ag to the right foot wound. Wounds are smaller. Patient has no issues or complaints today. Objective Constitutional Vitals Time Taken: 10:59 AM, Height: 69 in, Weight: 178 lbs, BMI: 26.3, Temperature: 98.4 F, Pulse: 65 bpm, Respiratory Rate: 6 breaths/min, Blood Pressure: 142/76 mmHg. General Notes: Right foot: T the plantar heel there is an incision site with increased depth, No probing to bone. T the opening there is slough and granulation ayslin kundert, Brennley A ([id number]) [id number].pdf Page 19 of 23 tissue Left foot: T the medial aspect there is an open wound with granulation tissue, Callus and slough build up. No overt signs of infection to any of the o wound beds. Integumentary (Hair, Skin) Wound #11 status is Open. Original cause of wound was Surgical Injury. The date acquired was: 12/01/2021. The wound has been in treatment 57 weeks. The wound is located on the Right,Plantar Foot. The wound measures 0.2cm length x 0.4cm width x 0.4cm depth; 0.063cm^2 area and 0.025cm^3 volume. There is Fat Layer (Subcutaneous Tissue) exposed. There is a small amount of serous drainage noted. The wound margin is distinct with the outline attached to the wound base. There is small (1-33%) pink, pale granulation within the wound bed. There is a large (67-100%) amount of necrotic tissue within the wound bed including Adherent Slough. Wound #12 status is Open. Original cause of wound was Pressure Injury. The date acquired was: 03/16/2020. The wound has been in treatment 57 weeks. The wound is located on the Left,Medial,Plantar Foot. The wound measures 0.3cm length x 0.4cm width x 0.1cm depth; 0.094cm^2 area and 0.009cm^3 volume. There is Fat Layer (Subcutaneous Tissue) exposed. There is a medium amount of serosanguineous drainage noted. The wound margin is flat and intact. There is large (67-100%)  pink, pale granulation within the wound bed. There is a small (1-33%) amount of necrotic tissue within  the wound bed including Adherent Slough. 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 I debrided nonviable tissue. We continued Hydrofera Blue under the total contact cast on the left leg and Aquacel Ag to the right leg with offloading heel shoe. Procedures Wound #11 Pre-procedure diagnosis of Wound #11 is an Open Surgical Wound located on the Right,Plantar Foot . There was a Selective/Open Wound Non-Viable Tissue Debridement with a total area of 0.06 sq cm performed by Geralyn Corwin, MD. With the following instrument(s): Curette to remove Viable and Non-Viable tissue/material. Material removed includes Callus and Slough and. A time out was conducted at 11:31, prior to the start of the procedure. A Minimum amount of bleeding was controlled with Pressure. The procedure was tolerated well. Post Debridement Measurements: 0.2cm length x 0.4cm width x 0.4cm depth; 0.025cm^3 volume. Character of Wound/Ulcer Post Debridement is improved. 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 There was a Selective/Open Wound Skin/Epidermis Debridement with a total area of 0.09 sq cm performed by Geralyn Corwin, MD. With the following instrument(s):  Curette to remove Viable and Non-Viable tissue/material. Material removed includes Callus, Slough, Skin: Dermis, and Skin: Epidermis. A time out was conducted at 11:28, prior to the start of the procedure. A Minimum amount of bleeding was controlled with Pressure. The procedure was tolerated well. Post Debridement Measurements: 0.3cm length x 0.4cm width x 0.1cm depth; 0.009cm^3 volume. Character of Wound/Ulcer Post Debridement is improved. Severity of Tissue Post Debridement is: Fat layer exposed. 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 There was a T Contact otal Cast Procedure by Geralyn Corwin, MD. Post procedure Diagnosis Wound #12: Same as Pre-Procedure Plan Follow-up Appointments: Return Appointment in 1 week. Nurse Visit as needed Bathing/ Shower/ Hygiene: May shower with wound dressing protected with water repellent cover or cast protector. No tub bath. Anesthetic (Use 'Patient Medications' Section for Anesthetic Order Entry): Lidocaine applied to wound bed Edema Control - Lymphedema / Segmental Compressive Device / Other: Elevate, Exercise Daily and Avoid Standing for Long Periods of Time. Elevate legs to the level of the heart and pump ankles as often as possible Elevate leg(s) parallel to the floor when sitting. Off-Loading: T Contact Cast to Left Lower Extremity - tcc applied left leg otal Open toe surgical shoe - Heel off loader - Right footTAKARI, DUNCOMBE A ([id number]) [id number] Page 20 of 23 Other: - keep pressure off of feet. Additional Orders / Instructions: Follow Nutritious Diet and Increase Protein Intake Other: - Stay off of foot, wheelchair only WOUND #11: - Foot Wound Laterality: Plantar, Right Cleanser: Wound Cleanser 3 x Per Week/30 Days Discharge Instructions: Wash your hands with  soap and water. Remove old dressing, discard into plastic bag and place into trash. Cleanse the wound with Wound Cleanser prior to applying a clean dressing using  gauze sponges, not tissues or cotton balls. Do not scrub or use excessive force. Pat dry using gauze sponges, not tissue or cotton balls. Prim Dressing: Aquacel Extra Hydrofiber Dressing, 2x2 (in/in) 3 x Per Week/30 Days ary Secondary Dressing: ABD Pad 5x9 (in/in) (Generic) 3 x Per Week/30 Days Discharge Instructions: Cover with ABD pad Secured With: Medipore T - 42M Medipore H Soft Cloth Surgical T ape ape, 2x2 (in/yd) 3 x Per Week/30 Days Secured With: [employer] or Non-Sterile 6-ply 4.5x4 (yd/yd) 3 x Per Week/30 Days Discharge Instructions: Apply Kerlix as directed WOUND #12: - Foot Wound Laterality: Plantar, Left, Medial Cleanser: Wound Cleanser 1 x Per Week/30 Days Discharge Instructions: Wash your hands with soap and water. Remove old dressing, discard into plastic bag and place into trash. Cleanse the wound with Wound Cleanser prior to applying a clean dressing using gauze sponges, not tissues or cotton balls. Do not scrub or use excessive force. Pat dry using gauze sponges, not tissue or cotton balls. Prim Dressing: Hydrofera Blue Ready Transfer Foam, 2.5x2.5 (in/in) 1 x Per Week/30 Days ary Discharge Instructions: Apply Hydrofera Blue Ready to wound bed as directed Secondary Dressing: heel cup 1 x Per Week/30 Days Discharge Instructions: x2 for heel and front of foot Secured With: Medipore T - 42M Medipore H Soft Cloth Surgical T ape ape, 2x2 (in/yd) 1 x Per Week/30 Days Secured With: [employer] Sterile or Non-Sterile 6-ply 4.5x4 (yd/yd) 1 x Per Week/30 Days Discharge Instructions: Apply Kerlix as directed 1. In office sharp debridement 2. T contact cast placed in standard fashionleft lower extremity otal 3. Hydrofera Blue 4. Aquacel Ag 5. Follow-up in 1 week Electronic Signature(s) Signed: 04/19/2023 4:25:37  PM By: Geralyn Corwin DO Entered By: Geralyn Corwin on 04/19/2023 12:47:37 -------------------------------------------------------------------------------- ROS/PFSH Details Patient Name: Date of Service: [name], Shavonne A. 04/19/2023 11:00 A M Medical Record Number: [id number] Patient Account Number: [id number] Date of Birth/Sex: Treating RN: 02-10-1976 (47 y.o. Skip Mayer Primary Care Provider: [name] Other Clinician: Referring Provider: Treating Provider/Extender: Chapman Moss in Treatment: 69 Information Obtained From Patient Ear/Nose/Mouth/Throat Medical History: Positive for: Chronic sinus problems/congestion; Middle ear problems Hematologic/Lymphatic Medical History: Positive for: Anemia Respiratory Medical History: Positive for: Chronic Obstructive Pulmonary Disease (COPD) Canipe, Omari A ([id number]) [id number] Page 21 of 23 Cardiovascular Medical History: Positive for: Congestive Heart Failure - EF 33% Endocrine Medical History: Positive for: Type II Diabetes Time with diabetes: 14 years Treated with: Insulin, Oral agents Blood sugar tested every day: No Blood sugar testing results: Bedtime: 176 Genitourinary Medical History: Positive for: End Stage Renal Disease Integumentary (Skin) Medical History: Positive for: History of pressure wounds Neurologic Medical History: Positive for: Neuropathy - feel and legs HBO Extended History Items Ear/Nose/Mouth/Throat: Ear/Nose/Mouth/Throat: Chronic sinus Middle ear problems problems/congestion Immunizations Pneumococcal Vaccine: Received Pneumococcal Vaccination: No Implantable Devices No devices added Family and Social History Current every day smoker; Marital Status - Single; Alcohol Use: Never; Drug Use: Prior History; Caffeine Use: Daily Electronic Signature(s) Signed: 04/19/2023 4:25:37 PM By: Geralyn Corwin DO Signed: 04/20/2023 4:37:47 PM  By: Elliot Gurney, BSN, RN, CWS, [name] RN, BSN Entered By: Geralyn Corwin on 04/19/2023 12:49:23 -------------------------------------------------------------------------------- Total Contact Cast Details Patient Name: Date of Service: [name] 04/19/2023 11:00 A M Medical Record Number: [id number] Patient Account Number: [id number] Date of Birth/Sex: Treating RN: 10-17-1976 (47 y.o. Skip Mayer Primary Care Provider: [name] Other Clinician: Referring Provider: Treating Provider/Extender: Chapman Moss in Treatment: 27 T  Contact Cast Applied for Wound Assessment: otal Wound #12 Left,Medial,Plantar Foot Performed By: Physician Geralyn Corwin, MD Earmon Phoenix A ([id number]) [id number] Page 22 of 23 Post Procedure Diagnosis Same as Pre-procedure Electronic Signature(s) Signed: 04/19/2023 4:25:37 PM By: Geralyn Corwin DO Signed: 04/19/2023 5:16:31 PM By: [name] Entered By: [name] on 04/19/2023 11:29:56 -------------------------------------------------------------------------------- SuperBill Details Patient Name: Date of Service: [name] 04/19/2023 Medical Record Number: [id number] Patient Account Number: [id number] Date of Birth/Sex: Treating RN: [birth year] (47 y.o. 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.621 Type 2 diabetes mellitus with foot ulcer E11.42 Type 2 diabetes mellitus with diabetic polyneuropathy L97.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 of skin Z79.4 Long term (current) use  of insulin Facility Procedures : CPT4 Code: 01027253 Description: 97597 - DEBRIDE WOUND 1ST 20 SQ CM OR < ICD-10 Diagnosis Description 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.621 Type 2 diabetes mellitus with foot ulcer Modifier: Quantity: 1 : CPT4 Code: 66440347 Description: 42595 - APPLY TOTAL CONTACT LEG CAST ICD-10 Diagnosis Description 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i E11.621 Type 2 diabetes mellitus with foot ulcer Modifier: ty Quantity: 1 Physician Procedures : CPT4 Code Description Modifier [id number] 97597 - WC PHYS DEBR WO ANESTH 20 SQ CM ICD-10 Diagnosis Description 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.621 Type 2 diabetes mellitus with foot ulcer Quantity: 1 Electronic Signature(s) Signed: 04/19/2023 4:25:37 PM By: Geralyn Corwin DO Entered By: Geralyn Corwin on 04/19/2023 12:49:01

## 2023-04-20 NOTE — Progress Notes (Signed)
[name], [name] ([id number]) [id number] Page 1 of 10 Visit Report for 04/19/2023 Arrival Information Details Patient Name: Date of Service: [name], [name] 04/19/2023 11:00 [name]: [id number] Patient Account Number: [id number] Date of Birth/Sex: Treating RN: 15-Aug-1976 (47 y.o. Skip Mayer Primary Care Hillel Card: [name] Other Clinician: Referring Shanielle Correll: Treating Bronwyn Belasco/Extender: Chapman Moss in Treatment: 37 Visit Information History Since Last Visit All ordered tests and consults were completed: No Patient Arrived: Wheel Chair Added or deleted any medications: No Arrival Time: 10:56 Any new allergies or adverse reactions: No Transfer Assistance: None Had [name] fall or experienced change in No Patient Identification Verified: Yes activities of daily living that may affect Secondary Verification Process Completed: Yes risk of falls: Patient Requires Transmission-Based Precautions: No Signs or symptoms of abuse/neglect since No Patient Has Alerts: No last visito Hospitalized since last visit: No Implantable device outside of the clinic No excluding cellular tissue based products placed in the center since last visit: Has Dressing in Place as Prescribed: Yes Has Footwear/Offloading in Place as Yes Prescribed: Left: T Contact Cast otal Right: Surgical Shoe with Pressure Relief Insole Pain Present Now: No Electronic Signature(s) Signed: 04/19/2023 5:16:31 PM By: [name] Entered By: [name] on 04/19/2023 10:59:16 -------------------------------------------------------------------------------- Clinic Level of Care Assessment Details Patient Name: Date of Service: [name], [name] 04/19/2023 11:00 [name]: [id number] Patient Account Number: [id number] Date of Birth/Sex: Treating RN: 1976-02-04 (47 y.o. Skip Mayer Primary Care Samaiyah Howes: [name] Other  Clinician: Referring Yumna Ebers: Treating Marcos Peloso/Extender: Chapman Moss in Treatment: 63 Clinic Level of Care Assessment Items YEVETTE, KNUST [name] ([id number]) [id number] Page 2 of 10 TOOL 1 Quantity Score []  - 0 Use when EandM and Procedure is performed on INITIAL visit ASSESSMENTS - Nursing Assessment / Reassessment []  - 0 General Physical Exam (combine w/ comprehensive assessment (listed just below) when performed on new pt. evals) []  - 0 Comprehensive Assessment (HX, ROS, Risk Assessments, Wounds Hx, etc.) ASSESSMENTS - Wound and Skin Assessment / Reassessment []  - 0 Dermatologic / Skin Assessment (not related to wound area) ASSESSMENTS - Ostomy and/or Continence Assessment and Care []  - 0 Incontinence Assessment and Management []  - 0 Ostomy Care Assessment and Management (repouching, etc.) PROCESS - Coordination of Care []  - 0 Simple Patient / Family Education for ongoing care []  - 0 Complex (extensive) Patient / Family Education for ongoing care []  - 0 Staff obtains Chiropractor, Records, T Results / Process Orders est []  - 0 Staff telephones HHA, Nursing Homes / Clarify orders / etc []  - 0 Routine Transfer to another Facility (non-emergent condition) []  - 0 Routine Hospital Admission (non-emergent condition) []  - 0 New Admissions / Manufacturing engineer / Ordering NPWT Apligraf, etc. , []  - 0 Emergency Hospital Admission (emergent condition) PROCESS - Special Needs []  - 0 Pediatric / Minor Patient Management []  - 0 Isolation Patient Management []  - 0 Hearing / Language / Visual special needs []  - 0 Assessment of Community assistance (transportation, D/C planning, etc.) []  - 0 Additional assistance / Altered mentation []  - 0 Support Surface(s) Assessment (bed, cushion, seat, etc.) INTERVENTIONS - Miscellaneous []  - 0 External ear exam []  - 0 Patient Transfer (multiple staff / Nurse, adult / Similar  devices) []  - 0 Simple Staple / Suture removal (25 or less) []  - 0 Complex Staple / Suture removal (26 or more) []  - 0 Hypo/Hyperglycemic Management (do not check if billed separately) []  -  0 Ankle / Brachial Index (ABI) - do not check if billed separately Has the patient been seen at the hospital within the last three years: Yes Total Score: 0 Level Of Care: ____ Electronic Signature(s) Signed: 04/19/2023 5:16:31 PM By: [name] Entered By: [name] on 04/19/2023 11:32:03 [name] ([id number]) [id number] Page 3 of 10 -------------------------------------------------------------------------------- Encounter Discharge Information Details Patient Name: Date of Service: [name], [name] 04/19/2023 11:00 [name]: [id number] Patient Account Number: [id number] Date of Birth/Sex: Treating RN: 11-17-1975 (47 y.o. Skip Mayer Primary Care Nasra Counce: [name] Other Clinician: Referring Kenyonna Micek: Treating Dewain Platz/Extender: Chapman Moss in Treatment: 46 Encounter Discharge Information Items Post Procedure Vitals Discharge Condition: Stable Temperature (F): 98.4 Ambulatory Status: Wheelchair Pulse (bpm): 65 Discharge Destination: Home Respiratory Rate (breaths/min): 16 Transportation: Private Auto Blood Pressure (mmHg): 142/76 Accompanied By: mother Schedule Follow-up Appointment: Yes Clinical Summary of Care: Electronic Signature(s) Signed: 04/19/2023 5:16:31 PM By: [name] Entered By: [name] on 04/19/2023 11:50:26 -------------------------------------------------------------------------------- Lower Extremity Assessment Details Patient Name: Date of Service: [name], [name] 04/19/2023 11:00 [name]: [id number] Patient Account Number: [id number] Date of Birth/Sex: Treating RN: 05/11/1976 (47 y.o. Skip Mayer Primary Care Mateen Franssen: [name] Other  Clinician: Referring Zitlali Primm: Treating Prudy Candy/Extender: [name] Weeks in Treatment: 57 Edema Assessment Assessed: [Left: No] [Right: No] [Left: Edema] [Right: :] Calf Left: Right: Point of Measurement: 31 cm From Medial Instep Ankle Left: Right: Point of Measurement: 10 cm From Medial Instep Electronic Signature(s) Signed: 04/19/2023 5:16:31 PM By: [name] Signed: 04/20/2023 4:37:47 PM By: Elliot Gurney, BSN, RN, CWS, [name] RN, BSN Entered By: [name] on 04/19/2023 11:15:25 [name] ([id number]) [id number] Page 4 of 10 -------------------------------------------------------------------------------- Multi Wound Chart Details Patient Name: Date of Service: [name], [name] 04/19/2023 11:00 [name]: [id number] Patient Account Number: [id number] Date of Birth/Sex: Treating RN: 22-Mar-1976 (47 y.o. Cathlean Cower, [name] Primary Care Ulis Kaps: [name] Other Clinician: Referring Giuseppina Quinones: Treating Starlin Steib/Extender: Chapman Moss in Treatment: 21 Vital Signs Height(in): 69 Pulse(bpm): 65 Weight(lbs): 178 Blood Pressure(mmHg): 142/76 Body Mass Index(BMI): 26.3 Temperature(F): 98.4 Respiratory Rate(breaths/min): 6 [11:Photos:] [N/[name]:N/[name]] Right, Plantar Foot Left, Medial, Plantar Foot N/[name] Wound Location: Surgical Injury Pressure Injury N/[name] Wounding Event: Open Surgical Wound 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: Extremity Chronic sinus problems/congestion, Chronic sinus problems/congestion, N/[name] Comorbid History: Middle ear problems, Anemia, Chronic Middle ear problems, Anemia, Chronic Obstructive Pulmonary Disease Obstructive Pulmonary Disease (COPD), Congestive Heart Failure, (COPD), Congestive Heart Failure, Type II Diabetes, End Stage Renal Type II Diabetes, End Stage Renal Disease, History of pressure wounds, Disease, History of pressure  wounds, Neuropathy Neuropathy 12/01/2021 03/16/2020 N/[name] Date Acquired: 23 57 N/[name] Weeks of Treatment: Open Open N/[name] Wound Status: No No N/[name] Wound Recurrence: 0.2x0.4x0.4 0.3x0.4x0.1 N/[name] Measurements L x W x D (cm) 0.063 0.094 N/[name] [name] (cm) : rea 0.025 0.009 N/[name] Volume (cm) : 96.60% 98.00% N/[name] % Reduction in Area: 98.50% 99.00% N/[name] % Reduction in Volume: Full Thickness Without Exposed Grade 3 N/[name] Classification: Support Structures Small Medium N/[name] Exudate Amount: Serous Serosanguineous N/[name] Exudate Type: amber red, brown N/[name] Exudate Color: Distinct, outline attached Flat and Intact N/[name] Wound Margin: Small (1-33%) Large (67-100%) N/[name] Granulation Amount: Pink, Pale Pink, Pale N/[name] Granulation Quality: Large (67-100%) Small (1-33%) N/[name] Necrotic Amount: Fat Layer (Subcutaneous Tissue): Yes Fat Layer (Subcutaneous Tissue): Yes N/[name] Exposed Structures: Fascia: No Fascia: No Tendon: No  Tendon: No Muscle: No Muscle: No Joint: No Joint: No Bone: No Bone: No Medium (34-66%) None N/[name] Epithelialization: Treatment Notes RUNA, WHITTINGHAM [name] ([id number]) [id number].pdf Page 5 of 10 Electronic Signature(s) Signed: 04/19/2023 5:16:31 PM By: [name] Entered By: [name] on 04/19/2023 11:15:30 -------------------------------------------------------------------------------- Multi-Disciplinary Care Plan Details Patient Name: Date of Service: Heather Jefferson [name]. 04/19/2023 11:00 [name]: [id number] Patient Account Number: [id number] Date of Birth/Sex: Treating RN: [birth year] (47 y.o. Skip Mayer Primary Care Jaxsyn Catalfamo: [name] Other Clinician: Referring Jenesis Suchy: Treating Zaylynn Rickett/Extender: Chapman Moss in Treatment: 11 Active Inactive Abuse / Safety / Falls / Self Care Management Nursing Diagnoses: History of Falls Potential for falls Potential for injury related to falls Goals: Patient will not  develop complications from immobility Date Initiated: 03/17/2022 Date Inactivated: 04/20/2022 Target Resolution Date: 03/16/2022 Goal Status: Met Patient/caregiver will verbalize understanding of skin care regimen Date Initiated: 03/17/2022 Date Inactivated: 09/28/2022 Target Resolution Date: 03/16/2022 Goal Status: Met Patient/caregiver will verbalize/demonstrate measure taken to improve self care Date Initiated: 03/17/2022 Target Resolution Date: 04/20/2022 Goal Status: Active Interventions: Assess fall risk on admission and as needed Provide education on basic hygiene Provide education on personal and home safety Notes: Osteomyelitis Nursing Diagnoses: Infection: osteomyelitis Knowledge deficit related to disease process and management Goals: Patient/caregiver will verbalize understanding of disease process and disease management Date Initiated: 03/17/2022 Date Inactivated: 03/27/2023 Target Resolution Date: 03/16/2022 Goal Status: Met Patient's osteomyelitis will resolve Date Initiated: 03/17/2022 Target Resolution Date: 10/26/2022 Goal Status: Active Signs and symptoms for osteomyelitis will be recognized and promptly addressed Date Initiated: 03/17/2022 Target Resolution Date: 10/26/2022 Goal Status: Active Interventions: Assess for signs and symptoms of osteomyelitis resolution every visit Provide education on osteomyelitis [name], TOTZKE [name] ([id number]) [id number].pdf Page 6 of 10 Treatment Activities: Systemic antibiotics : 03/16/2022 Notes: Patient on antibiotics. Electronic Signature(s) Signed: 04/19/2023 5:16:31 PM By: [name] Signed: 04/20/2023 4:37:47 PM By: Elliot Gurney, BSN, RN, CWS, [name] RN, BSN Entered By: [name] on 04/19/2023 11:32:20 -------------------------------------------------------------------------------- Pain Assessment Details Patient Name: Date of Service: Heather Jefferson [name]. 04/19/2023 11:00 [name]:  [id number] Patient Account Number: [id number] Date of Birth/Sex: Treating RN: 20-Oct-1976 (47 y.o. Skip Mayer Primary Care Haniah Penny: [name] Other Clinician: Referring Karinda Cabriales: Treating Cadden Elizondo/Extender: Chapman Moss in Treatment: 75 Active Problems Location of Pain Severity and Description of Pain Patient Has Paino No Site Locations Pain Management and Medication Current Pain Management: Electronic Signature(s) Signed: 04/19/2023 5:16:31 PM By: [name] Signed: 04/20/2023 4:37:47 PM By: Elliot Gurney, BSN, RN, CWS, [name] RN, BSN Entered By: [name] on 04/19/2023 11:01:36 [name] ([id number]) [id number] Page 7 of 10 -------------------------------------------------------------------------------- Patient/Caregiver Education Details Patient Name: Date of Service: [name], [name] 6/26/2024andnbsp11:00 [name]: [id number] Patient Account Number: [id number] Date of Birth/Gender: Treating RN: 12-19-1975 (47 y.o. Skip Mayer Primary Care Physician: [name] Other Clinician: Referring Physician: Treating Physician/Extender: Chapman Moss in Treatment: 36 Education Assessment Education Provided To: Patient Education Topics Provided Wound/Skin Impairment: Handouts: Other: continue wound care as directed Methods: Explain/Verbal Responses: State content correctly Electronic Signature(s) Signed: 04/19/2023 5:16:31 PM By: [name] Entered By: [name] on 04/19/2023 11:32:38 -------------------------------------------------------------------------------- Wound Assessment Details Patient Name: Date of Service: [name], [name] 04/19/2023 11:00 [name]: [id number] Patient Account Number: [id number] Date of Birth/Sex: Treating RN: July 13, 1976 (47 y.o. Skip Mayer Primary Care Geoffery Aultman: [name] Other Clinician: Referring  Nadja Lina: Treating Vaiden Adames/Extender: Chapman Moss in Treatment: 52 Wound Status Wound Number: 11 Primary Open Surgical Wound Etiology: Wound Location: Right, Plantar Foot Wound Open Wounding Event: Surgical Injury Status: Date Acquired: 12/01/2021 Comorbid Chronic sinus problems/congestion, Middle ear problems, Anemia, Weeks Of Treatment: 57 History: Chronic Obstructive Pulmonary Disease (COPD), Congestive Heart Clustered Wound: No Failure, Type II Diabetes, End Stage Renal Disease, History of pressure wounds, Neuropathy Photos JAYDAN, CHRETIEN [name] ([id number]) [phone].pdf Page 8 of 10 Wound Measurements Length: (cm) 0.2 Width: (cm) 0.4 Depth: (cm) 0.4 Area: (cm) 0.063 Volume: (cm) 0.025 % Reduction in Area: 96.6% % Reduction in Volume: 98.5% Epithelialization: Medium (34-66%) Wound Description Classification: Full Thickness Without Exposed Suppo Wound Margin: Distinct, outline attached Exudate Amount: Small Exudate Type: Serous Exudate Color: amber rt Structures Foul Odor After Cleansing: No Slough/Fibrino Yes Wound Bed Granulation Amount: Small (1-33%) Exposed Structure Granulation Quality: Pink, Pale Fascia Exposed: No Necrotic Amount: Large (67-100%) Fat Layer (Subcutaneous Tissue) Exposed: Yes Necrotic Quality: Adherent Slough Tendon Exposed: No Muscle Exposed: No Joint Exposed: No Bone Exposed: No Treatment Notes Wound #11 (Foot) Wound Laterality: Plantar, Right Cleanser Wound Cleanser Discharge Instruction: Wash your hands with soap and water. Remove old dressing, discard into plastic bag and place into trash. Cleanse the wound with Wound Cleanser prior to applying [name] clean dressing using gauze sponges, not tissues or cotton balls. Do not scrub or use excessive force. Pat dry using gauze sponges, not tissue or cotton balls. Heather-Wound Care Topical Primary Dressing Aquacel Extra Hydrofiber Dressing, 2x2  (in/in) Secondary Dressing ABD Pad 5x9 (in/in) Discharge Instruction: Cover with ABD pad Secured With Medipore T - 28M Medipore H Soft Cloth Surgical T ape ape, 2x2 (in/yd) Kerlix Roll Sterile or Non-Sterile 6-ply 4.5x4 (yd/yd) Discharge Instruction: Apply Kerlix as directed Compression Wrap Compression Stockings Add-Ons Electronic Signature(s) Signed: 04/19/2023 5:16:31 PM By: [name] Signed: 04/20/2023 4:37:47 PM By: Elliot Gurney, BSN, RN, CWS, [name] RN, BSN Entered By: [name] on 04/19/2023 11:14:21 [name] ([id number]) [id number] Page 9 of 10 -------------------------------------------------------------------------------- Wound Assessment Details Patient Name: Date of Service: [name], [name] 04/19/2023 11:00 [name]: [id number] Patient Account Number: [id number] Date of Birth/Sex: Treating RN: 1976/05/26 (47 y.o. Cathlean Cower, [name] Primary Care Ellinor Test: [name] Other Clinician: Referring Raja Liska: Treating Velisa Regnier/Extender: [name] Weeks in Treatment: 50 Wound Status Wound Number: 12 Primary Diabetic Wound/Ulcer of the Lower Extremity Etiology: Wound Location: Left, Medial, Plantar Foot Wound Open Wounding Event: Pressure Injury Status: Date Acquired: 03/16/2020 Comorbid Chronic sinus problems/congestion, Middle ear problems, Anemia, Weeks Of Treatment: 57 History: Chronic Obstructive Pulmonary Disease (COPD), Congestive Heart Clustered Wound: No Failure, Type II Diabetes, End Stage Renal Disease, History of pressure wounds, Neuropathy Photos Wound Measurements Length: (cm) 0.3 Width: (cm) 0.4 Depth: (cm) 0.1 Area: (cm) 0.094 Volume: (cm) 0.009 % Reduction in Area: 98% % Reduction in Volume: 99% Epithelialization: None Wound Description Classification: Grade 3 Wound Margin: Flat and Intact Exudate Amount: Medium Exudate Type: Serosanguineous Exudate Color: red, brown Foul  Odor After Cleansing: No Slough/Fibrino No Wound Bed Granulation Amount: Large (67-100%) Exposed Structure Granulation Quality: Pink, Pale Fascia Exposed: No Necrotic Amount: Small (1-33%) Fat Layer (Subcutaneous Tissue) Exposed: Yes Necrotic Quality: Adherent Slough Tendon Exposed: No Muscle Exposed: No Joint Exposed: No Bone Exposed: No Treatment Notes Wound #12 (Foot) Wound Laterality: Plantar, Left, Medial Cleanser Wound Cleanser Discharge Instruction: Wash your hands with soap and water. Remove old dressing, discard into plastic bag and place into  trash. Cleanse the KALAN, RINN [name] ([id number]) [id number] Page 10 of 10 wound with Wound Cleanser prior to applying [name] clean dressing using gauze sponges, not tissues or cotton balls. Do not scrub or use excessive force. Pat dry using gauze sponges, not tissue or cotton balls. Heather-Wound Care Topical Primary Dressing Hydrofera Blue Ready Transfer Foam, 2.5x2.5 (in/in) Discharge Instruction: Apply Hydrofera Blue Ready to wound bed as directed Secondary Dressing heel cup Discharge Instruction: x2 for heel and front of foot Secured With Medipore T - 18M Medipore H Soft Cloth Surgical T ape ape, 2x2 (in/yd) Kerlix Roll Sterile or Non-Sterile 6-ply 4.5x4 (yd/yd) Discharge Instruction: Apply Kerlix as directed Compression Wrap Compression Stockings Add-Ons Electronic Signature(s) Signed: 04/19/2023 5:16:31 PM By: [name] Signed: 04/20/2023 4:37:47 PM By: Elliot Gurney, BSN, RN, CWS, [name] RN, BSN Entered By: [name] on 04/19/2023 11:14:50 -------------------------------------------------------------------------------- Vitals Details Patient Name: Date of Service: [name], [name] 04/19/2023 11:00 [name]: [id number] Patient Account Number: [id number] Date of Birth/Sex: Treating RN: 1976-01-03 (47 y.o. Cathlean Cower, [name] Primary Care Briella Hobday: [name] Other Clinician: Referring  Samuell Knoble: Treating Psalm Schappell/Extender: Chapman Moss in Treatment: 28 Vital Signs Time Taken: 10:59 Temperature (F): 98.4 Height (in): 69 Pulse (bpm): 65 Weight (lbs): 178 Respiratory Rate (breaths/min): 6 Body Mass Index (BMI): 26.3 Blood Pressure (mmHg): 142/76 Reference Range: 80 - 120 mg / dl Electronic Signature(s) Signed: 04/19/2023 5:16:31 PM By: [name] Entered By: [name] on 04/19/2023 11:01:24

## 2023-04-26 ENCOUNTER — Encounter: Payer: MEDICAID | Attending: Internal Medicine | Admitting: Internal Medicine

## 2023-04-26 DIAGNOSIS — Z794 Long term (current) use of insulin: Secondary | ICD-10-CM | POA: Insufficient documentation

## 2023-04-26 DIAGNOSIS — E11622 Type 2 diabetes mellitus with other skin ulcer: Secondary | ICD-10-CM | POA: Diagnosis not present

## 2023-04-26 DIAGNOSIS — F17210 Nicotine dependence, cigarettes, uncomplicated: Secondary | ICD-10-CM | POA: Diagnosis not present

## 2023-04-26 DIAGNOSIS — L97528 Non-pressure chronic ulcer of other part of left foot with other specified severity: Secondary | ICD-10-CM | POA: Diagnosis not present

## 2023-04-26 DIAGNOSIS — E1142 Type 2 diabetes mellitus with diabetic polyneuropathy: Secondary | ICD-10-CM | POA: Insufficient documentation

## 2023-04-26 DIAGNOSIS — S81801A Unspecified open wound, right lower leg, initial encounter: Secondary | ICD-10-CM | POA: Insufficient documentation

## 2023-04-26 DIAGNOSIS — I132 Hypertensive heart and chronic kidney disease with heart failure and with stage 5 chronic kidney disease, or end stage renal disease: Secondary | ICD-10-CM | POA: Insufficient documentation

## 2023-04-26 DIAGNOSIS — I5022 Chronic systolic (congestive) heart failure: Secondary | ICD-10-CM | POA: Diagnosis not present

## 2023-04-26 DIAGNOSIS — N186 End stage renal disease: Secondary | ICD-10-CM | POA: Diagnosis not present

## 2023-04-26 DIAGNOSIS — J449 Chronic obstructive pulmonary disease, unspecified: Secondary | ICD-10-CM | POA: Insufficient documentation

## 2023-04-26 DIAGNOSIS — L97512 Non-pressure chronic ulcer of other part of right foot with fat layer exposed: Secondary | ICD-10-CM | POA: Insufficient documentation

## 2023-04-26 DIAGNOSIS — Z7984 Long term (current) use of oral hypoglycemic drugs: Secondary | ICD-10-CM | POA: Diagnosis not present

## 2023-04-26 DIAGNOSIS — E1122 Type 2 diabetes mellitus with diabetic chronic kidney disease: Secondary | ICD-10-CM | POA: Diagnosis not present

## 2023-04-26 DIAGNOSIS — L97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 of skin: Secondary | ICD-10-CM | POA: Diagnosis not present

## 2023-04-26 DIAGNOSIS — E11621 Type 2 diabetes mellitus with foot ulcer: Secondary | ICD-10-CM | POA: Diagnosis not present

## 2023-04-26 DIAGNOSIS — W540XXA Bitten by dog, initial encounter: Secondary | ICD-10-CM | POA: Insufficient documentation

## 2023-05-03 ENCOUNTER — Encounter (HOSPITAL_BASED_OUTPATIENT_CLINIC_OR_DEPARTMENT_OTHER): Payer: MEDICAID | Admitting: Internal Medicine

## 2023-05-03 DIAGNOSIS — L97512 Non-pressure chronic ulcer of other part of right foot with fat layer exposed: Secondary | ICD-10-CM | POA: Diagnosis not present

## 2023-05-03 DIAGNOSIS — L97528 Non-pressure chronic ulcer of other part of left foot with other specified severity: Secondary | ICD-10-CM

## 2023-05-03 DIAGNOSIS — E11621 Type 2 diabetes mellitus with foot ulcer: Secondary | ICD-10-CM

## 2023-05-10 ENCOUNTER — Encounter: Payer: MEDICAID | Admitting: Internal Medicine

## 2023-05-10 DIAGNOSIS — L97512 Non-pressure chronic ulcer of other part of right foot with fat layer exposed: Secondary | ICD-10-CM | POA: Diagnosis not present

## 2023-05-10 DIAGNOSIS — L97528 Non-pressure chronic ulcer of other part of left foot with other specified severity: Secondary | ICD-10-CM | POA: Diagnosis not present

## 2023-05-10 DIAGNOSIS — E11621 Type 2 diabetes mellitus with foot ulcer: Secondary | ICD-10-CM

## 2023-05-10 NOTE — Progress Notes (Signed)
ALANIS, CLIFT [name] ([id number]) [id number] Page 1 of 23 Visit Report for 05/10/2023 Chief Complaint Document Details Patient Name: Date of Service: [name], [name] 05/10/2023 11:00 [name]: [id number] Patient Account Number: [id number] Date of Birth/Sex: Treating RN: 04-28-1976 (47 y.o. [name] Primary Care Provider: [name] Other Clinician: Referring Provider: Treating Provider/Extender: Chapman Moss in Treatment: 65 Information Obtained from: Patient Chief Complaint 03/19/2021; patient referred by Dr. Excell Seltzer who has been looking after her left foot for quite [name] period of time for review of [name] nonhealing area in the left midfoot 03/12/2022; bilateral feet wounds and right lower extremity wound. Electronic Signature(s) Signed: 05/10/2023 1:39:39 PM By: Heather Corwin DO Entered By: [name] on 05/10/2023 11:37:30 -------------------------------------------------------------------------------- Debridement Details Patient Name: Date of Service: [name], [name] 05/10/2023 11:00 [name]: [id number] Patient Account Number: [id number] Date of Birth/Sex: Treating RN: 1976/01/25 (47 y.o. [name] Primary Care Provider: [name] Other Clinician: Referring Provider: Treating Provider/Extender: Chapman Moss in Treatment: 60 Debridement Performed for Assessment: Wound #12 Left,Medial,Plantar Foot Performed By: Physician Heather Corwin, MD Debridement Type: Debridement Severity of Tissue Pre Debridement: Fat layer exposed Level of Consciousness (Pre-procedure): Awake and Alert Pre-procedure Verification/Time Out Yes - 11:29 Taken: Pain Control: Lidocaine 4% T opical Solution Percent of Wound Bed Debrided: 100% T Area Debrided (cm): otal 0.03 Tissue and other material debrided: Viable, Non-Viable, Callus, Slough, Skin: Dermis , Skin:  Epidermis, Slough Level: Skin/Epidermis Debridement Description: Selective/Open Wound Instrument: Curette Bleeding: Moderate Hemostasis Achieved: Pressure Response to Treatment: Procedure was tolerated well Level of Consciousness (Post- Awake and Alert [name], [name] ([id number]) [id number] Page 2 of 23 Awake and Alert procedure): Post Debridement Measurements of Total Wound Length: (cm) 0.2 Width: (cm) 0.2 Depth: (cm) 0.2 Volume: (cm) 0.006 Character of Wound/Ulcer Post Debridement: Stable Severity of Tissue Post Debridement: Fat layer exposed Post Procedure Diagnosis Same as Pre-procedure Electronic Signature(s) Signed: 05/10/2023 1:39:39 PM By: Heather Corwin DO Signed: 05/10/2023 4:35:37 PM By: [name] Entered By: [name] on 05/10/2023 11:30:32 -------------------------------------------------------------------------------- Debridement Details Patient Name: Date of Service: [name] 05/10/2023 11:00 [name]: [id number] Patient Account Number: [id number] Date of Birth/Sex: Treating RN: January 24, 1976 (47 y.o. [name] Primary Care Provider: [name] Other Clinician: Referring Provider: Treating Provider/Extender: Chapman Moss in Treatment: 60 Debridement Performed for Assessment: Wound #11 Right,Plantar Foot Performed By: Physician Heather Corwin, MD Debridement Type: Debridement Level of Consciousness (Pre-procedure): Awake and Alert Pre-procedure Verification/Time Out Yes - 11:31 Taken: Pain Control: Lidocaine 4% T opical Solution Percent of Wound Bed Debrided: 100% T Area Debrided (cm): otal 0.01 Tissue and other material debrided: Viable, Non-Viable, Callus, Slough, Subcutaneous, Skin: Dermis , Skin: Epidermis, Slough Level: Skin/Subcutaneous Tissue Debridement Description: Excisional Instrument: Curette Bleeding: Moderate Hemostasis Achieved:  Pressure Response to Treatment: Procedure was tolerated well Level of Consciousness (Post- Awake and Alert procedure): Post Debridement Measurements of Total Wound Length: (cm) 0.1 Width: (cm) 0.1 Depth: (cm) 0.3 Volume: (cm) 0.002 Character of Wound/Ulcer Post Debridement: Stable Post Procedure Diagnosis Same as Pre-procedure Electronic Signature(s) Signed: 05/10/2023 1:39:39 PM By: Heather Corwin DO Signed: 05/10/2023 4:35:37 PM By: [name] Entered By: [name] on 05/10/2023 11:31:50 [name] ([id number]) [id number] Page 3 of 23 -------------------------------------------------------------------------------- HPI Details Patient Name: Date of Service: Heather, [name] 05/10/2023 11:00 [name]: [id number] Patient Account Number: [id number] Date of Birth/Sex: Treating  RN: 1976/01/03 (47 y.o. [name] Primary Care Provider: [name] Other Clinician: Referring Provider: Treating Provider/Extender: Chapman Moss in Treatment: 50 History of Present Illness HPI Description: 01/18/18-She is here for initial evaluation of the left great toe ulcer. She is [name] poor historian in regards to timeframe in detail. She states approximately 4 weeks ago she lacerated her toe on something in the house. She followed up with her primary care who placed her on Bactrim and ultimately [name] second dose of Bactrim prior to coming to wound clinic. She states she has been treating the toe with peroxide, Betadine and [name] Band-Aid. She did not check her blood sugar this morning but checked it yesterday morning it was 327; she is unaware of [name] recent A1c and there are no current records. She saw Dr. she would've orthopedics last week for an old injury to the left ankle, she states he did not see her toe, nor did she bring it to his attention. She smokes approximately 1 pack cigarettes [name] day. Her social situation is  concerning, she arrives this morning with her mother who appears extremely intoxicated/under the influence; her mother was asked to leave the room and be monitored by the patient's grandmother. The patient's aunt then accompanied the patient and the room throughout the rest of the appointment. We had [name] lengthy discussion regarding the deleterious effects of uncontrolled hyperglycemia and smoking as it relates to wound healing and overall health. She was strongly encouraged to decrease her smoking and get her diabetes under better control. She states she is currently on [name] diet and has cut down her [facility] consumption. The left toe is erythematous, macerated and slightly edematous with malodor present. The edema in her left foot is below her baseline, there is no erythema streaking. We will treat her with Santyl, doxycycline; we have ordered and xray, culture and provided [name] Peg assist surgical shoe and cultured the wound. 01/25/18-She is here in follow-up evaluation for [name] left great toe ulcer and presents with an abscess to her suprapubic area. She states her blood sugars remain elevated, feeling "sick" and if levels are below 250, but she is trying. She has made no attempt to decrease her smoking stating that we "can't take away her food in her cigarettes". She has been compliant with offloading using the PEG assist you. She is using Santyl daily. the culture obtained last week grew staph aureus and Enterococcus faecalis; continues on the doxycycline and Augmentin was added on Monday. The suprapubic area has erythema, no femoral variation, purple discoloration, minimal induration, was accessed with [name] cotton tip applicator with sanguinopurulent drainage, this was cultured, I suspect the current antibiotic treatment will cover and we will not add anything to her current treatment plan. She was advised to go to urgent care or ER with any change in redness, induration or fever. 02/01/18-She is here in  follow-up evaluation for left great toe ulcers and [name] new abdominal abscess from last week. She was able to use packing until earlier this week, where she "forgot it was there". She states she was feeling ill with GI symptoms last week and was not taking her antibiotic. She states her glucose levels have been predominantly less than 200, with occasional levels between 200-250. She thinks this was contributing to her GI symptoms as they have resolved without intervention. There continues to be significant laceration to left toe, otherwise it clinically looks stable/improved. There is now less superficial opening to the lateral  aspect of the great toe that was residual blister. We will transition to [facility] to all wounds, she will continue her Augmentin. If there is no change or deterioration next week for reculture. 02/08/18-She is here in follow-up evaluation for left great toe ulcer and abdominal ulcer. There is an improvement in both wounds. She has been wrapping her left toe with coban, not by our direction, which has created an area of discoloration to the medial aspect; she has been advised to NOT use coban secondary to her neuropathy. She states her glucose levels have been high over this last week ranging from 200-350, she continues to smoke. She admits to being less compliant with her offloading shoe. We will continue with same treatment plan and she will follow-up next week. 02/15/18-She is here in follow-up evaluation for left great toe ulcer and abdominal ulcer. The abdominal ulcer is epithelialized. The left great toe ulcer is improved and all injury from last week using the Coban wrap is resolved, the lateral ulcer is healed. She admits to noncompliance with wearing offloading shoe and admits to glucose levels being greater than 300 most of the week. She continues to smoke and expresses no desire to quit. There is one area medially that probes deeper than it has historically, erythema  to the toe and dorsal foot has consistently waxed and waned. There is no overt signs of cellulitis or infection but we will culture the wound for any occult infection given the new area of depth and erythema. We will hold off on sensitivities for initiation of antibiotic therapy. 02/22/18-She is here in follow up evaluation for left great toe ulcer. There is overall significant improvement in both wound appearance, erythema and edema with changes made last week. She was not initiated on antibiotic therapy. Culture obtained last week showed oxacillin sensitive staph aureus, sensitive to clindamycin. Clindamycin has been called into the pharmacy but she has been instructed to hold off on initiation secondary to overall clinical improvement and her history of antibiotic intolerance. She has been instructed to contact the clinic with any noted changes/deterioration and the wound, erythema, edema and/or pain. She will follow-up next week. She continues to smoke and her glucose levels remain elevated >250; she admits to compliance with offloading shoe 03/01/18 on evaluation today patient appears to be doing fairly well in regard to her left first toe ulcer. She has been tolerating the dressing changes with the [facility] Dressing without complication and overall this has definitely showed signs of improvement according to records as well is what the patient tells me today. I'm very pleased in that regard. She is having no pain today 03/08/18 She is here for follow up evaluation of [name] left great toe ulcer. She remains non-compliant with glucose control and smoking cessation; glucose levels consistently >200. She states that she got new shoe inserts/peg assist. She admits to compliance with offloading. Since my last evaluation there is significant improvement. We will switch to prisma at this time and she will follow up next week. She is noted to be tachycardic at this appointment, heart rate 120s; she has [name]  history of heart rate 70-130 according to our records. She admits to extreme agitation r/t personal issues; she was advised to monitor her heartrate and contact her physician if it does not return to [name] more normal range (<100). She takes cardizem twice daily. 03/15/18-She is here in follow-up evaluation for left great toe ulcer. She remains noncompliant with glucose control and smoking cessation. She  admits to compliance with wearing offloading shoe. The ulcer is improved/stable and we will continue with the same treatment plan and she will follow-up next week 03/22/18-She is here for evaluation for left great toe ulcer. There continues to be significant improvement despite recurrent hyperglycemia (over 500 yesterday) and she continues to smoke. She has been compliant with offloading and we will continue with same treatment plan and she will follow-up next week. 03/29/18-She is here for evaluation for left great toe ulcer. Despite continuing to smoke and uncontrolled diabetes she continues to improve. She is compliant with offloading shoe. We will continue with the same treatment plan and she will follow-up next week 04/05/18- She is here in follow up evaluation for [name] left great toe ulcer; she presents with small pustule to left fifth toe (resembles ant bite). She admits to compliance with wearing offloading shoe; continues to smoke or have uncontrolled blood glucose control. There is more callus than usual with evidence of bleeding; she denies known trauma. 04/12/18-She is here for evaluation of left great toe ulcer. Despite noncompliance with glycemic control and smoking she continues to make improvement. She continues to wear offloading shoe. The pustule, that was identified last week, to the left fifth toe is resolved. She will follow-up in 2 weeks Masri, Adelynne [name] ([id number]) [id number] Page 4 of 23 05/03/18-she is seen in follow-up evaluation for [name] left great toe ulcer. She  is compliant with offloading, otherwise noncompliant with glycemic control and smoking. She has plateaued and there is minimal improvement noted. We will transition to [facility], replaced the insert to her surgical shoe and she will follow-up in one week 05/10/18- She is here in follow up evaluation for [name] left great toe ulcer. It appears stable despite measurement change. We will continue with same treatment plan and follow up next week. 05/24/18-She is seen in follow-up evaluation for [name] left great toe ulcer. She remains compliant with offloading, has made significant improvement in her diet, decreasing the amount of sugar/soda. She said her recent A1c was 10.9 which is lower than. She did see [name] diabetic nutritionist/educator yesterday. She continues to smoke. We will continue with the same treatment plan and she'll follow-up next week. 05/31/18- She is seen in follow-up evaluation for left great toe ulcer. She continues to remain compliant with offloading, continues to make improvement in her diet, increasing her water and decreasing the amount of sugar/soda. She does continue to smoke with no desire to quit. We will apply Prisma to the depth and Hydrofera Blue over. We have not received insurance authorization for oasis. She will follow up next week. 06/07/18-She is seen in follow-up evaluation for left great toe ulcer. It has stalled according to today's measurements although base appears stable. She says she saw [name] diabetic educator yesterday; her average blood sugars are less than 300 which is an improvement for her. She continues to smoke and states "that's my next step" She continues with water over soda. We will order for xray, culture and reinstate ace wrap compression prior to placing apligraf for next week. She is voicing no complaints or concerns. Her dressing will change to iodoflex over the next week in preparation for apligraf. 06/14/18-She is seen in follow-up evaluation for left great  toe ulcer. Plain film x-ray performed last week was negative for osteomyelitis. Wound culture obtained last week grew strep B and OSSA; she is initiated on keflex and cefdinir today; there is erythema to the toe which could be from ace wrap compression,  she has [name] history of wrapping too tight and has has been encouraged to maintain ace wraps that we place today. We will hold off on application of apligraf today, will apply next week after antibiotic therapy has been initiated. She admits today that she has resumed taking [name] shower with her foot/toe submerged in water, she has been reminded to keep foot/toe out of the bath water. She will be seen in follow up next week 06/21/18-she is seen in follow-up evaluation for left great toe ulcer. She is tolerating antibiotic therapy with no GI disturbance. The wound is stable. Apligraf was applied today. She has been decreasing her smoking, only had 4 cigarettes yesterday and 1 today. She continues being more compliant in diabetic diet. She will follow-up next week for evaluation of site, if stable will remove at 2 weeks. 06/28/18- She is here in follow up evalution. Apligraf was placed last week, she states the dressing fell off on Tuesday and she was dressing with hydrofera blue. She is healed and will be discharged from the clinic today. She has been instructed to continue with smoking cessation, continue monitoring glucose levels, offloading for an additional 4 weeks and continue with hydrofera blue for additional two weeks for any possible microscopic opening. Readmission: 08/07/18 on evaluation today patient presents for reevaluation concerning the ulcer of her right great toe. She was previously discharged on 06/28/18 healed. Nonetheless she states that this began to show signs of drainage she subsequently went to her primary care provider. Subsequently an x-ray was performed on 08/01/18 which was negative. The patient was also placed on antibiotics at that  time. Fortunately they should have been effective for the infection. Nonetheless she's been experiencing some improvement but still has [name] lot of drainage coming from the wound itself. 08/14/18 on evaluation today patient's wound actually does show signs of improvement in regard to the erythema at this point. She has completed the antibiotics. With that being said we did discuss the possibility of placing her in [name] total contact cast as of today although I think that I may want to give this just [name] little bit more time to ensure nothing recurrence as far as her infection is concerned. I do not want to put in the cast and risk infection at that time if things are not completely resolved. With that being said she is gonna require some debridement today. 08/21/18 on evaluation today patient actually appears to be doing okay in regard to her toe ulcer. She's been tolerating the dressing changes without complication. With that being said it does appear that she is ready and in fact I think it's appropriate for [place] to go ahead and initiate the total contact cast today. Nonetheless she will require some sharp debridement to prepare the wound for application. Overall I feel like things have been progressing well but we do need to do something to get this to close more readily. 08/24/18 patient seen today for reevaluation after having had the total contact cast applied on Tuesday. She seems to have done very well the wound appears to be doing great and overall I'm pleased with the progress that she's made. There were no abnormal areas of rubbing from the cast on her lower extremity. 08/30/18 on evaluation today patient actually appears to be completely healed in regard to her plantar toe ulcer. She tells me at this point she's been having [name] lot of issues with the cast. She almost fell [name] couple of times the state shall the  step of her dog [name] couple times as well. This is been [name] very frustrating process for her other  nonetheless she has completely healed the wound which is excellent news. Overall there does not appear to be the evidence of infection at this time which is great news. 09/11/18 evaluation today patient presents for follow-up concerning her great toe ulcer on the left which has unfortunately reopened since I last saw her which was only [name] couple of weeks ago. Unfortunately she was not able to get in to get the shoe and potentially the AFO that's gonna be necessary due to her left foot drop. She continues with offloading shoe but this is not enough to prevent her from reopening it appears. When we last had her in the total contact cast she did well from [name] healing standpoint but unfortunately the wound reopened as soon as she came out of the cast within just [name] couple of weeks. 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 This is also part of the reason why she falls on [name] regular basis. I just do not believe that is gonna be safe for [place] to reinitiate the total contact cast as last time we had this on she fell 3 times one week which is definitely not normal for her. 09/18/18 upon evaluation today the patient actually appears to be doing about the same in regard to her toe ulcer. She did not contact [facility] as I asked her to even though I had given her the prescription. In fact she actually states that she has no idea where the prescription is. She did apparently call [facility] and they told her that all she needed to do was bring the prescription in order to be able to be seen and work on getting the AFO for her left foot. With all that being said she still does not have an appointment and I'm not sure were things stand that regard. I will give her [name] new prescription today in order to contact them to get this set  up. 09/25/18 on evaluation today patient actually appears to be doing about the same in regard to her toes ulcer. She does have [name] small areas which seems to have [name] lot of callous buildup around the edge of the wound which is going to need sharp debridement today. She still is waiting to be scheduled for evaluation with [facility] for possibility of an AFO. She states there supposed to call her tomorrow to get this set up. Unfortunately it does appear that her foot specifically the toe area is showing signs of erythema. There does not appear to be any systemic infection which is in these good news. 10/02/18 on evaluation today patient actually appears to be doing about the same in regard to her toe ulcer. This really has not done too well although it's not significantly larger it's also not significantly smaller. She has been tolerating the dressing changes without complication. She actually has her appointment with [facility] and [facility] tomorrow to hopefully be measured for obtaining and AFO splint. I think this would be helpful preventing this from reoccurring. We had contemplated starting the cast this week although to be honest I am reluctant to do that as she's been having nausea, vomiting, and seizure activity over the past  three days. She has [name] history of seizures and have been told is nothing that can be done for these. With that being said I do believe that along with the seizures have the nausea vomiting which upon further questioning doesn't seem to be the normal for her and makes me concerned for the possibility of infection or something else going on. I discussed this with the patient and her mother during the office visit today. I do not feel the wound is effective but maybe something else. The responses this was "this just happens to her at times and we don't know why". They did not seem to be interested in going to the hospital to have this checked out further. 10/09/18 on evaluation today  patient presents for follow-up concerning her ongoing toe ulcer. She has been tolerating the dressing changes without complication. Fortunately there does not appear to be any evidence of infection which is great news however I do think that the patient would benefit from going ahead for with the total contact cast. She's actually in [name] wheelchair today she tells me that she will use her walker if we initiate the cast. I was very specific about the fact that if we were gonna do the cast I wanted to make sure that she was using the walker in order to prevent any falls. She tells me she does not have stairs that she has to traverse on [name] regular basis at her home. She has not had any seizures since last week again that something that happens to her often she tells me she did talk to [employer] and they said that it may take up to three weeks to get the brace approved for her. Hopefully that will not take that long but nonetheless in the meantime I do think the cast could be of benefit. 10/12/18 on evaluation today patient appears to be doing rather well in regard to her toe ulcer. It's just been [name] few days and already this is significantly improved both as far as overall appearance and size. Fortunately there's no sign of infection. She is here for her first obligatory cast change. [name], [name] ([id number]) [id number] Page 5 of 23 10/19/18 Seen today for follow up and management of left great toe ulcer. Wound continues to show improvement. Noted small open area with seroussang drainage with palpation. Denies any increased pain or recent fevers during visit. She will continue calcium alginate with offloading shoe. Denies any questions or concerns during visit. 10/26/18 on evaluation today patient appears to be doing about the same as when I last saw her in regard to her wound bed. Fortunately there does not appear to be any signs of infection. Unfortunately she continues to have [name]  breakdown in regard to the toe region any time that she is not in the cast. It takes almost no time at all for this to happen. Nonetheless she still has not heard anything from the brace being made by [employer] as to when exactly this will be available to her. Fortunately there is no signs of infection at this time. 10/30/18 on evaluation today patient presents for application of the total contact cast as we just received him this morning. Fortunately we are gonna be able to apply this to her today which is great news. She continues to have no significant pain which is good news. Overall I do feel like things have been improving while she was the cast is when she doesn't have [name] cast that things get  worse. She still has not really heard anything from [employer] regarding her brace. 11/02/18 upon evaluation today patient's wound already appears to be doing significantly better which is good news. Fortunately there does not appear to be any signs of infection also good news. Overall I do think the total contact cast as before is helping to heal this area unfortunately it's just not gonna likely keep the area closed and healed without her getting her brace at least. Again the foot drop is [name] significant issue for her. 11/09/18 on evaluation today patient appears to be doing excellent in regard to her toe ulcer which in fact is completely healed. Fortunately we finally got the situation squared away with the paperwork which was needed to proceed with getting her brace approved by Medicaid. I have filled that out unfortunately that information has been sent to the orthopedic office that I worked at 2 1/2 years ago and not tired Current wound care measures. Fortunately she seems to be doing very well at this time. 11/23/18 on evaluation today patient appears to be doing More Poorly Compared to Last Time I Saw Her. At [facility] She Had Completely Healed. Currently she is continuing to have issues with reopening. She  states that she just found out that the brace was approved through Medicaid now she just has to go get measured in order to have this fitted for her and then made. Subsequently she does not have an appointment for this yet that is going to complicate things we obviously cannot put her back in the cast if we do not have everything measured because they're not gonna be able to measure her foot while she is in the cast. 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 Obviously this is unfortunate and does have me somewhat worried as well. 11/30/18 on evaluation today patient's toe ulcer actually appears to be doing fairly well. The good news is she will be getting her brace in the shoes next week on Wednesday. Hopefully we will be able to get this to heal without having to go back in the cast however she may need the cast in order to get the wound completely heal and then go from there. Fortunately there's no signs of infection at this time. 12/07/18 on evaluation today patient fortunately did receive her brace and she states she could tell this definitely makes her walk better. With that being said she's been having issues with her toe where she noticed yesterday there was [name] lot of tissue that was loosing off this appears to be much larger than what it was previous. She also states that her leg has been read putting much across the top of her foot just about the ankle although this seems to be receiving somewhat. The total area is still red and appears to be someone infected as best I can tell. She is previously taken Bactrim and that may be [name] good option for her today as well. We are gonna see what I wound culture shows as well and I think that this is definitely appropriate. With that being said outside of the culture I still need to initiate something in the interim and that's what I'm gonna go ahead and select  Bactrim is [name] good option for her. 12/14/18 on evaluation today patient appears to be doing better in regard to her left great toe ulcer as compared  to last week's evaluation. There's still some erythema although this is significantly improved which is excellent news. Overall I do believe that she is making good progress is still gonna take some time before she is where I would like her to be from the standpoint of being able to place her back into the total contact cast. Hopefully we will be where we need to be by next week. 12/21/18 on evaluation today patient actually appears to be doing poorly in regard to her toe ulcer. She's been tolerating the dressing changes without complication. Fortunately there's no signs of systemic infection although she does have [name] lot of drainage from the toe ulcer and this does seem to be causing some issues at this point. She does have erythema on the distal portion of her toe that appears to be likely cellulitis. 12/28/18 on evaluation today patient actually appears to be doing [name] little better in my pinion in regard to her toe ulcer. With that being said she still does have some evidence of infection at this time and for her culture she had both E. coli as well as enterococcus as organisms noted on evaluation. For that reason I think that though the Keflex likely has treated the E. coli rather well this has really done nothing for the enterococcus. We are going to have to initiate treatment for this specifically. 01/04/19 on evaluation today patient's toe actually appears to be doing better from the standpoint of infection. She currently would like to see about putting the cash back on I think that this is appropriate as long as she takes care of it and keeps it from getting wet. She is gonna have some drainage we can definitely pass this up with Drawtex and alginate to try to prevent as much drainage as possible from causing the problems. With that being said I do want to  at least try her with the cast between now and Tuesday. If there any issues we can't continue to use it then I will discontinue the use of the cast at that point. 01/08/19 on evaluation today patient actually appears to be doing very well as far as her foot ulcer specifically the great toe on the left is concerned. She did have an area of rubbing on the medial aspect of her left ankle which again is from the cast. Fortunately there's no signs of infection at this point in this appears to be [name] very slight skin breakdown. The patient tells me she felt it rubbing but didn't think it was that bad. Fortunately there is no signs of active infection at this time which is good news. No fevers, chills, nausea, or vomiting noted at this time. 01/15/19 on evaluation today patient actually appears to be doing well in regard to her toe ulcer. Again as previous she seems to do well and she has the cast on which indicates to me that during the time she doesn't have [name] cast on she's putting way too much pressure on this region. Obviously I think that's gonna be an issue as with the current national emergency concerning the Covid-19 Virus it has been recommended that we discontinue the use of total contact casting by the chief medical officer of our company, Dr. Maurine Minister. The reasoning is that if [name] patient becomes sick and cannot come into have the cast removed they could not just leave this on for an additional two weeks. Obviously the hospitals also do not want to receive patient's who are sick into the  emergency department to potentially contaminate the region and spread the Covid-19 Virus among other sick individuals within the hospital system. Therefore at this point we are suspending the use of total contact cast until the current emergency subsides. This was all discussed with the patient today as well. 01/22/19 on evaluation today patient's wound on her left great toe appears to be doing slightly worse than previously  noted last week. She tells me that she has been on this quite [name] bit in fact she tells me she's been awake for 38 straight hours. This is due to the fact that she's having to care for grandparents because nobody else will. She has been taking care of them for five the last seven days since I've seen her they both have dementia his is from [name] stroke and her grandmother's was progressive. Nonetheless she states even her mom who knows her condition and situation has only help two of those days to take care of them she's been taking care of the rest. Fortunately there does not appear to be any signs of active infection in regard to her toe at this point although obviously it doesn't look as good as it did previous. I think this is directly related to her not taking off the pressure and friction by way of taking things easy. Though I completely understand what's going on. 01/29/19 on evaluation today patient's tools are actually appears to be showing some signs of improvement today compared to last week's evaluation as far as not necessarily the overall size of the wound but the fact that she has some new skin growth in between the two ends of the wound opening. Overall I feel like she has done well she states that she had [name] family member give her what sounds to be [name] CAM walker boot which has been helpful as well. 02/05/19 on evaluation today patient's wound bed actually appears to be doing significantly better in regard to her overall appearance of the size of the wound. With that being said she is still having an issue with offloading efficiently enough to get this to close. Apparently there is some signs of infection at this point as well unfortunately. Previously she's done well of Augmentin I really do not see anything that needs to be culture currently but there theme and cellulitis of the foot that I'm seeing I'm gonna go ahead and place her on an antibiotic today to try to help clear this up. 02/12/2019  on evaluation today patient actually appears to be doing poorly in regard to her overall wound status. She tells me she has been using her offloading shoe but actually comes in today wearing her tennis shoe with the AFO brace. Again as I previously discussed with her this is really not sufficient to allow the [name], [name] ([id number]) [id number] Page 6 of 23 area to heal appropriately. Nonetheless she continues to be somewhat noncompliant and I do wonder based on what she has told my nurse in the past as to whether or not [name] good portion of this noncompliance may be recreational drug and alcohol related. She has had [name] history of heroin overdose and this was fairly recently in the past couple of months that have been seeing her. Nonetheless overall I feel like her wound looks significantly worse today compared to what it was previous. She still has significant erythema despite the Augmentin I am not sure that this is an appropriate medication for her infection I am also concerned that the  infection may have gone down into her bone. 02/19/19 on evaluation today patient actually appears to be doing about the same in regard to her toe ulcer. Unfortunately she continues to show signs of bone exposure and infection at this point. There does not appear to be any evidence of worsening of the infection but I'm also not really sure that it's getting significantly better. She is on the Augmentin which should be sufficient for the Staphylococcus aureus infection that she has at this point. With that being said she may need IV antibiotics to more appropriately treat this. We did have [name] discussion today about hyperbaric option therapy. 02/28/19 on evaluation today patient actually appears to be doing much worse in regard to the wound on her left great toe as compared to even my previous evaluation last week. Unfortunately this seems to be training in [name] pretty poor direction. Her toe was  actually now starting to angle laterally and I can actually see the entire joint area of the proximal portion of the digit where is the distal portion of the digit again is no longer even in contact with the joint line. Unfortunately there's [name] lot more necrotic tissue around the edge and the toe appears to be showing signs of becoming gangrenous in my pinion. I'm very concerned about were things stand at this point. She did see infectious disease and they are planning to send in [name] prescription for Sivextro for her and apparently this has been approved. With that being said I don't think she should avoid taking this but at the same time I'm not sure that it's gonna be sufficient to save her toe at this point. She tells me that she still having to care for grandparents which I think is putting quite [name] bit of strain on her foot and specifically the total area and has caused this to break down even to [name] greater degree than would've otherwise been expected. 03/05/19 on evaluation today patient actually appears to be doing quite well in regard to her toe all things considering. She still has bone exposed but there appears to be much less your thing on overall the appearance of the wound and the toe itself is dramatically improved. She still does have some issues currently obviously with infection she did see vascular as well and there concerned that her blood flow to the toad. For that reason they are setting up for an angiogram next week. 03/14/19 on evaluation today patient appears to be doing very poor in regard to her toe and specifically in regard to the ulceration and the fact that she's starting to notice the toe was leaning even more towards the lateral aspect and the complete joint is visible on the proximal aspect of the joint. Nonetheless she's also noted [name] significant odor and the tip of the toe is turning more dark and necrotic appearing. Overall I think she is getting worse not better as far  as this is concerned. For that reason I am recommending at this point that she likely needs to be seen for likely amputation. READMISSION 03/19/2021 This is [name] patient that we cared for in this clinic for [name] prolonged period of time in 2019 and 2020 with [name] left foot and left first toe wound. 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 Excell Seltzer. In December 2021 she had an ulcer on her right great toe  as well as the fourth and fifth toes. 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 She also had an angiogram at that time and underwent angioplasty of the right anterior tibial artery. In any case she claims that the wound on the right foot is closed I did not look at this today which was probably an oversight although I think that should be done next week. 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 According to Dr. Bernette Redbird last review that she was out of the area being cared for by another physician but recently came back to his attention. 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 [name] culture of this area showed E. coli apparently before she came back to see Dr. Excell Seltzer she was supposed to be receiving antibiotics but she did not really take them. Nor is she offloading this area at all. Finally her last hemoglobin A1c listed in epic was in March 2022 at 14.1 she says things are [name] lot better since then although I am not sure. 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 She had this wound at the time. During this admission she had E. coli sepsis [name] TEE was negative. 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 She is still [name] heavy cigarette smoker. She has [name] history of bipolar disorder seizure disorder ABI in our clinic was 1.05 6/1; left midfoot in the setting of [name] TMA done previously. Round circular wound with [name] "knuckle" of protruding tissue. The problem is that the knuckle was not attached to any of the surrounding granulation and this probed proximally widely I removed [name] large portion of this tissue. This wound goes with considerable undermining laterally. I do not feel any bone there was no purulence but this is [name] deep wound. 6/8; in spite of the debridement I did last week. She arrives with [name] wound looking exactly the same. [name] protruding "knuckle" of tissue nonadherent to most of the surrounding tissue. There is considerable depth around this from 6-12 o'clock at 2.7 cm and undermining of 1 cm. This does not look overtly infected and the x- ray I did last week was negative for any osseous abnormalities. We have been using silver collagen 6/15; deep tissue culture I did last week showed moderate staph aureus and moderate Pseudomonas. This will definitely require prolonged antibiotic therapy. The pathology on the protuberant area was negative for malignancy fungus etc. 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 We have been using silver collagen. I am going to be prescribing Levaquin for 2 weeks. Her CT scan of the foot is down for 7/5 6/22; CT scan of the foot on 7 5. She says she has hardware in the left leg from her previous fracture. She is on the Levaquin for the deep tissue culture I did that showed methicillin sensitive staph aureus and Pseudomonas. I gave her [name] 2-week supply and she will have another week. She arrives in clinic today with the same protuberant tissue however this  is nonadherent to the tissue surrounding it. I am really at [name] loss to explain this unless there is  underlying deep tissue infection 6/29; patient presents for 1 week follow-up. She has been using collagen to the wound bed. She reports taking her antibiotics as prescribed.She has no complaints or issues today. She denies signs of infection. 7/6; patient presents for one week followup. She has been using collagen to the wound bed. She states she is taking Levaquin however at times she is not able to keep it down. She denies signs of infection. 7/13; patient presents for 1 week follow-up. She has been using silver alginate to the wound bed. She still has nausea when taking her antibiotics. She denies signs of infection. 7/20; patient presents for 1 week follow-up. She has been using silver alginate with gentamicin cream to the wound bed. She denies any issues and has no complaints today. She denies signs of infection. 7/27; patient presents for 1 week follow-up. She continues to use silver alginate with gentamicin cream to the wound bed. She reports starting her antibiotics. She has no issues or complaints. Overall she reports stability to the wound. 8/3; patient presents for 1 week follow-up. She has been using silver alginate with gentamicin cream to the wound bed. She reports completing all antibiotics. She has no issues or complaints today. She denies signs of infection. 8/17; patient presents for 2-week follow-up. He is to use silver alginate to the wound bed. She has no issues or complaints today. She denies signs of infection. She reports her pain has improved in her foot since last clinic visit 8/24; patient presents for 1 week follow-up. She continues to use silver alginate to the wound bed. She has no issues or complaints. She denies signs of infection. Pain is stable. 9/7; patient presents for follow-up. She missed her last week appointment due to feeling ill. She continues to use silver  alginate. She has [name] new wound to the right lower extremity that is covered in eschar. She states It occurred over the past week and has no idea how it started. She currently denies signs of infection. 9/14; patient presents for follow-up. T the left foot wound she has been using gentamicin cream and silver alginate. T the right lower extremity wound she has o o been keeping this covered and has not obtain Santyl. 9/21; patient presents for follow-up. She reports using gentamicin cream and silver alginate to the left foot and Santyl to the right lower extremity wound. She has no issues or complaints today. She denies signs of infection. 9/28; patient presents for follow-up. She reports [name] new wound to her right heel. She states this occurred [name] few days ago and is progressively gotten worse. She has been trying to clean the area with [name] Q-tip and Santyl. She reports stability in the other 2 wounds. She has been using gentamicin cream and silver alginate to the left foot and Santyl to the right lower extremity wound. [name], [name] ([id number]) [id number] Page 7 of 23 10/12; patient presents for follow-up. She reports improvement to the wound beds. She is seeing vein and vascular to discuss the potential of [name] left BKA. She states they are going to do an arteriogram. She continues to use silver alginate with dressing changes to her wounds. 11/2; patient presents for follow-up. She states she has not been doing dressing changes to the wound beds. She states she is not able to offload the areas. She reports chronic pain to her left foot wound. 11/9; patient presents for follow-up. She came in with only socks on. She states she forgot to put  on shoes. It is unclear if she is doing any dressing changes. She currently denies systemic signs of infection. 11/16; patient presents for follow-up. She came again only with socks on. She states she does not wear shoes ever. It is unclear  if she does dressing changes. She currently denies systemic signs of infection. 11/23; patient presents for follow-up. She wore her shoes today. It still unclear exactly what dressing she is using for each wound but she did states she obtained Dakin's solution and has been using this to the left foot wound. She currently denies signs of infection. 11/30; patient presents for follow-up. She has no issues or complaints today. She currently denies signs of infection. 12/7; patient presents for follow-up. She has no issues or complaints today. She has been using Hydrofera Blue to the right heel wound and Dakin solution to the left foot wound. Her right anterior leg wound is healed. She currently denies signs of infection. 12/14; patient presents for follow-up. She has been using Hydrofera Blue to the right heel and Dakin's to the left foot wounds. She has no issues or complaints today. She denies signs of infection. 12/21; patient presents for follow-up. She reports using Hydrofera Blue to the right heel and Dakin's to the left foot wound. She denies signs of infection. 12/28; patient presents for follow-up. She continues to use Dakin's to the left foot wound and Hydrofera Blue to the right heel wound. She denies signs of infection. 1/4; patient presents for follow-up. She has no issues or complaints today. She denies signs of infection. 1/11; patient presents for follow-up. It is unclear if she has been dressing these wounds over the past week. She currently denies signs of infection. 1/18; patient presents for follow-up. She states she has been using Dakin's wet-to-dry dressings to the left foot. She has been using Hydrofera Blue to the right foot foot wound. She states that the anterior right leg wound has reopened and draining serous fluid. She denies signs of infection. 1/25; patient presents for follow-up. She has no issues or complaints today. 2/1; patient presents for follow-up. She has no  issues or complaints today. She denies signs of infection. 2/8; patient presents for follow-up. She has lost her surgical shoes. She did not have [name] dressing to the right heel wound. She currently denies signs of infection. 2/15; patient presents for follow-up. She reports more pain to the right heel today. She denies purulent drainage Or fever/chills 2/22; patient presents for follow-up. She reports taking clindamycin over the past week. She states that she continues to have pain to her right heel. She reports purulent drainage. Readmission 03/16/2022 Ms. Devan Babino is [name] 47 year old female with [name] past medical history of type 2 diabetes, osteomyelitis to her feet, chronic systolic heart failure and bipolar disorder that presents to the clinic for bilateral feet wounds and right lower extremity wound. She was last seen in our clinic on 12/15/2021. At that time she had purulent drainage coming out of her right plantar foot and I recommended she go to the ED. She states she went to [facility] and has been there for the past 3 months. I cannot see the records. She states she had OR debridement and was on several weeks of IV antibiotics while inpatient. Since discharge she has not been taking care of the wound beds. She had nothing on her feet other than socks today. She currently denies signs of infection. 5/31; patient presents for follow-up. She has been using Dakin's wet-to-dry  dressings to the wound beds on her feet bilaterally and antibiotic ointment to the right anterior leg wound. She had [name] wound culture done at last clinic visit that showed moderate Pseudomonas aeruginosa sensitive to ciprofloxacin. She currently denies systemic signs of infection. 6/14; patient presents for follow-up. She received Keystone 5 days ago and has been using this on the wound beds. She states that last week she had to go to the hospital because she had increased warmth and erythema to the right foot. She  was started on 2 oral antibiotics. She states she has been taking these. She currently denies systemic signs of infection. She has no issues or complaints today. 6/21; patient presents for follow-up. She states she has been using Keystone antibiotics to the wound beds. She has no issues or complaints today. She denies signs of infection. 6/28; patient presents for follow-up. She has been using Keystone antibiotics to the wound beds. She has no issues or complaints today. 7/12; patient presents for follow-up. Has been using Keystone antibiotics to the wound beds with calcium alginate. She has no issues or complaints today. She never followed up with her orthopedic surgeon who did the OR debridement to the right foot. We discussed the total contact cast for the left foot and patient would like to do this next week. 7/19; patient presents for follow-up. She has been using Keystone antibiotics with calcium alginate to the wound beds. She has no issues or complaints today. Patient is in agreement to do the total contact cast of the left foot today. She knows to return later this week for the obligatory cast change. 05-13-2022 upon evaluation today patient's wound which she has the cast of the left leg actually appears to be doing significantly better. Fortunately I do not see any signs of active infection locally or systemically which is great news and overall I am extremely pleased with where we stand currently. 7/26; patient presents for follow-up. She has [name] cast in place for the past week. She states it irritated her shin. Other than that she tolerated the cast well. She states she would like [name] break for 1 week from the cast. We have been using Keystone antibiotic and Aquacel to both wound beds. She denies signs of infection. 8/2; patient presents for follow-up. She has been using Keystone and Aquacel to the wound beds. She denies any issues and has no complaints. She is agreeable to have the cast  placed today for the left leg. 06-03-2022 upon evaluation today patient appears to be doing well with regard to her wound she saw some signs of improvement which is great news. Fortunately I do not see any evidence of active infection locally or systemically at this time which is great news. No fevers, chills, nausea, vomiting, or diarrhea. 8/16; patient presents for follow-up. She has no issues or complaints today. We have been using Keystone and Aquacel to the wound beds. The left lower extremity is in [name] total contact cast. She is tolerated this well. MARIELLEN, BLANEY [name] ([id number]) [id number] Page 8 of 23 8/23; patient presents for follow-up. She has had the total contact cast on the left leg for the past week. Unfortunately this has rubbed and broken down the skin to the medial foot. She currently denies signs of infection. She has been using Keystone antibiotic to the right plantar foot wound. 8/30; patient presents for follow-up. We have held off on the total contact cast for the left leg for the past week. Her wound on  the left foot has improved and the previous surrounding breakdown of skin has epithelialized. She has been using Keystone antibiotic to both wound beds. She has no issues or complaints today. She denies signs of infection. 9/6; patient presents for follow-up. She has ordered her's Keystone antibiotic and this is arriving this week. She has been doing Dakin's wet-to-dry dressings to the wound beds. She denies signs of infection. She is agreeable to the total contact cast today. 9/13; patient presents for follow-up. She states that the cast caused her left leg shin to be sore. She would like to take [name] break from the cast this week. She has been using Keystone antibiotic to the right plantar foot wound. She denies signs of infection. 9/20; patient presents for follow-up. She has been using Keystone antibiotics to the wound beds with calcium alginate to the  right foot wound and Hydrofera Blue to the left foot wound. She is agreeable to having the cast placed today. She has been approved for Apligraf and we will order this for next clinic visit. 9/27; patient presents for follow-up. We have been using Keystone antibiotic with Hydrofera Blue to the left foot wound under [name] total contact cast. T the right o foot wound she has been using Keystone antibiotic and calcium alginate. She declines [name] total contact cast today. Apligraf is available for placement and she would like to proceed with this. 07-28-2022 upon evaluation today patient appears to be doing well currently in regard to her wound. She is actually showing signs of significant improvement which is great news. Fortunately I do not see any evidence of active infection locally nor systemically at this time. She has been seeing Dr. Mikey Bussing and to be honest has been doing very well with the cast. Subsequently she comes in today with [name] cast on and we did reapply that today as well. She did not really want to she try to talk me out of that but I explained that if she wanted to heal this is really the right way to go. Patient voiced understanding. In regard to her right foot this is actually [name] lot better compared to the last time I saw her which is also great news. 10/11; patient presents for follow-up. Apligraf and the total contact cast was placed to the left leg at last clinic visit. She states that her right foot wound had burning pain to it with the placement of Apligraf to this area. She has been doing [place] over this area. She denies signs of infection including increased warmth, erythema or purulent drainage. 11/1; 3-week follow-up. The patient fortunately did not have [name] total contact cast or an Apligraf and on the left foot. She has been using Keystone ABD pads and kerlix and her own running shoes She arrives in clinic today with thick callus and [name] very poor surface on the left foot on the right  nonviable skin subcutaneous tissue and [name] deep probing hole. 11/15; patient missed her last clinic appointment. She states she has not been dressing the wound beds for the past 2 weeks. She states that at she had [name] new roommate but is now going back to live with her mother. Apparently its been [name] distracting 2 weeks. Patient currently denies signs of infection. 11/22; patient presents for follow-up. She states she has been using Keystone antibiotic and Dakin's wet-to-dry dressings to the wound beds. She is agreeable for cast placement today. We had ordered Apligraf however this has not been received by our facility. 11/29;  Patient had [name] total contact cast placed at last clinic visit and she tolerated this well. We were using silver alginate under the cast. Patient's been using Keystone antibiotic with Aquacel to the right plantar foot wound. She has no issues or complaints today. Apligraf is available for placement today. Patient would like to proceed with this. 12/6; patient presents for follow-up. She had Apligraf placed in standard fashion last clinic visit under the total contact cast to the left lower extremity. She has been using Keystone antibiotic and Aquacel to the right plantar foot wound. She has no issues or complaints today. 12/13; patient presents for follow-up. She has finished 5 Apligraf placements. Was told she would not qualify for more. We have been doing [name] total contact cast to the left lower extremity. She has been using Keystone antibiotic and Aquacel to the right plantar foot wound. She has no issues or complaints today. 12/20; patient presents for follow-up. We have been using Hydrofera Blue with Keystone antibiotic under [name] total contact cast of the left lower extremity. She reports using Keystone antibiotic and silver alginate to the right heel wound. She has no issues or complaints today. 12/27; patient presents with [name] healthy wound on the left midfoot. We have Apligraf to apply  that to that more also using [name] total contact cast. On the right we are using Keystone and silver alginate. She is offloading the right heel with [name] surgical shoealthough by her admission she is on her feet quite [name] bit 1/3; patient presents for follow-up. Apligraf was placed to the wound beds last clinic visit. She was placed in [name] total contact cast to the left lower extremity. She declines [name] total contact cast today. She states that her mother is in the hospital and she cannot adequately get around with the cast on. 1/10; patient presents for follow-up. She declined the total contact cast at last clinic visit. Both wounds have declined in appearance. She states that she has been on her feet and not offloading the wound beds. She currently denies signs of infection. 1/17; patient presents for follow-up. She had the total contact cast along with Apligraf placed last week to the left lower extremity. She tolerated this well. She has been using Aquacel Ag and Keystone antibiotic to the right heel wound. She currently denies signs of infection. She has no issues or complaints today. 1/24; patient presents for follow-up. We have been using Apligraf to the left foot wound along with [name] total contact cast. She has done well with this. T the right o heel wound she has been using Aquacel Ag and Keystone antibiotic ointment. She has no issues or complaints today. She denies signs of infection. 1/31; patient presents for follow-up. We have been using Apligraf to the left foot wound along with the total contact cast. She continues to do well with this. To the right heel we have been using Aquacel Ag and Keystone antibiotic ointment. She has no issues or complaints today. 12-01-2022 upon evaluation patient is seen today on my schedule due to the fact that she unfortunately was in the hospital yesterday. Her cast needed to come off the only reason she is out of the hospital is due to the fact that they would not take it  off at the hospital which is somewhat bewildered reading to me to be perfectly honest. I am not certain why this was but either way she was released and then was placed on my schedule today in order to get  this off and reapply the total contact casting as appropriate. I do not have an Apligraf for her today it was applied last week and today's actually expired yesterday as there was some scheduling conflicts with her being in the hospital. Nonetheless we do not have that for reapplication today but the good news that she is not draining too much and the Apligraf can go for up to 2 weeks so I am going to go ahead and reapply the total contact casting but we are going to leave the Apligraf in place. 2/14; patient presents for follow-up. T the left leg she has had the total contact cast and Apligraf for the past week. She has had no issues with this. T the o o right heel she has been using antibiotic ointment and Aquacel Ag. 2/21; patient presents for follow-up. We have been using Apligraf and [name] total contact cast to the left lower extremity. She is tolerated this well. Unfortunately she is not approved for any more Apligraf per insurance. She has been using antibiotic ointment and Aquacel Ag to the right foot. She has no issues or complaints today. 2/28; patient presents for follow-up. We have been using Hydrofera Blue and antibiotic ointment under the total contact cast to the left lower extremity. He has been using Aquacel Ag with antibiotic ointment to the right plantar foot. She has no issues or complaints today. 3/6; patient presents for follow-up. She did not obtain her gentamicin ointment. She has been using Aquacel Ag to the right plantar foot wound. We have been using Hydrofera Blue with antibiotic ointment under the total contact cast to the left lower extremity. She has no issues or complaints today. [name], [name] ([id number]) [id number] Page 9 of 23 3/12;  patient presents for follow-up. She has been using gentamicin ointment and Aquacel Ag to the right plantar foot wound. We have been using Hydrofera Blue with antibiotic ointment under the total contact cast on the left lower extremity foot wound. She has no issues or complaints today. 3/20; patient presents for follow-up. She has been using gentamicin ointment and Aquacel Ag to the right plantar foot wound. We have been using Hydrofera Blue with antibiotic ointment under the total contact cast to the left lower extremity wound. She has no issues or complaints today. 3/27; patient presents for follow-up. We have been using antibiotic ointment and Aquacel Ag to the right plantar foot wound. We have been using Hydrofera Blue with antibiotic ointment under the total contact cast to the left lower extremity. Both wounds are smaller. 01-24-2023 upon evaluation today patient actually appears to be doing better compared to last time I saw her. It has been several weeks since I last saw her but nonetheless I think the total contact casting is doing [name] good job. Fortunately I do not see any evidence of infection or worsening overall which is great news and in general I do believe that she is moving in the appropriate direction. It has been [name] very long road but nonetheless I feel like she is nearing the end at least in regard to the left foot and even the right foot looks much better to be perfectly honest. 01-31-2023 upon evaluation today patient appears to be doing well currently in regard to her wounds. She has been require some sharp debridement which I think is probably bone both sides to remove [name] lot of callus that has built up at this point. I discussed that with her today. Also think total contact cast  is doing well for the left foot Emina continue that as well. 4/17; patient presents for follow-up. We have been doing Hydrofera Blue with antibiotic ointment under the total contact cast to the left lower  extremity and Aquacel Ag with antibiotic ointment to the right foot wound. She has been using her offloading heel shoe here. 4/24; patient presents for follow-up. We have been doing Hydrofera Blue with antibiotic ointment under the total contact cast to the left lower extremity and Aquacel Ag with antibiotic ointment to the right foot wound. She has been using her offloading heel shoe here. Wounds are smaller with slough accumulation. 5/1; patient presents for follow-up. We have been doing Hydrofera Blue with antibiotic ointment under the total contact cast to the left lower extremity and Aquacel Ag with antibiotic ointment to the right foot wound. Left foot wound is stable and right foot wound appears smaller. 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 On the left she is using Hydrofera Blue topical antibiotic under [name] total contact cast, on the right plantar heel gentamicin Aquacel Ag. According to our intake nurse both are making slow but steady improvements. 5/15; patient presents for follow-up. We have been doing Hydrofera Blue with antibiotic ointment under the total contact cast on the left and antibiotic ointment with Aquacel Ag on the right. Unfortunately the left foot wound has declined in size and appearance. We also do not have the total contact cast available in office today. Patient denies signs of infection. 5/22; patient presents for follow-up. We have been doing Hydrofera Blue and antibiotic ointment to the left foot and Aquacel Ag with antibiotic ointment to the right foot. We took [name] break from the cast since we did not have it placed at last week. Wounds look stable if not slightly improved. 03-27-2023 patient presents today she has had this For Most 2 Weeks Because She Was in the Hospital and They Did Not T It off. Again That Is Really Not ake Something That She Could Control but Nonetheless We Are Seeing Her at This Point Today for Reevaluation. We Do Want to Go and See about  Reapply the Cast. Again We Had All Set Everything up to Go Ahead and Do This When We Went to Get the Cast Only to Realize That Not Had Come in and We Were out at the Moment. With That Being [place] Spaniel We Will Get Have T Get the Exam before We Can Reapply the T Contact Casting Going Forward. o otal 6/12; patient presents for follow-up. She has been using Hydrofera Blue to the left foot wound and Aquacel Ag to the right foot wound. No cast was available last week placed on the patient so she has been offloading with her surgical shoe. She has no issues or complaints today. 6/19; patient presents for follow-up. We have been using Hydrofera Blue to the left foot wound under the total contact cast and Aquacel Ag to the right wound. She has no issues or complaints today. The wounds are smaller. 6/26; patient presents for follow-up. We have been using Hydrofera Blue to the left foot wound under the total contact cast and Aquacel Ag to the right foot wound. Wounds are smaller. Patient has no issues or complaints today. 7/3; patient presents for follow-up. We have been using Hydrofera Blue to the left foot wound under the total contact cast and Aquacel Ag to the right foot wound. Wounds Appear smaller. No signs of infection. 7/10; patient presents for follow-up. We have been using Hydrofera  Blue to the left foot wound under the total contact cast and Aquacel Ag to the right foot wound. Left foot wound is smaller. Right foot wound is stable. 7/17; patient presents for follow-up. We have been using Hydrofera Blue to the left foot under the total contact cast and Aquacel Ag to the right foot wound. She has no issues or complaints today. Electronic Signature(s) Signed: 05/10/2023 1:39:39 PM By: Heather Corwin DO Entered By: [name] on 05/10/2023 11:38:07 -------------------------------------------------------------------------------- Physical Exam Details Patient Name: Date of Service: Heather Jefferson  [name]. 05/10/2023 11:00 [name]: [id number] Patient Account Number: [id number] Date of Birth/Sex: Treating RN: 12/26/1975 (47 y.o. [name] Primary Care Provider: [name] Other Clinician: Referring Provider: Treating Provider/Extender: Chapman Moss in Treatment: [address] Blaire [name] ([id number]) [id number] Page 10 of 23 Constitutional . Cardiovascular . Psychiatric . Notes Right foot: T the plantar heel there is an incision site with increased depth, Granulation tissue and slough at the opening. Left foot: T the medial aspect there o o is an open wound with granulation tissue, Callus and slough build up. No overt signs of infection to any of the wound beds. Electronic Signature(s) Signed: 05/10/2023 1:39:39 PM By: Heather Corwin DO Entered By: [name] on 05/10/2023 11:38:33 -------------------------------------------------------------------------------- Physician Orders Details Patient Name: Date of Service: [name] 05/10/2023 11:00 [name]: [id number] Patient Account Number: [id number] Date of Birth/Sex: Treating RN: May 23, 1976 (47 y.o. [name] Primary Care Provider: [name] Other Clinician: Referring Provider: Treating Provider/Extender: Chapman Moss in Treatment: 16 Verbal / Phone Orders: No Diagnosis Coding ICD-10 Coding Code Description 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ity 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.621 Type 2 diabetes mellitus with foot ulcer E11.42 Type 2 diabetes mellitus with diabetic polyneuropathy L97.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 of skin Z79.4 Long term (current) use of insulin Follow-up Appointments Return Appointment in 1  week. Bathing/ Shower/ Hygiene May shower with wound dressing protected with water repellent cover or cast protector. No tub bath. Anesthetic (Use 'Patient Medications' Section for Anesthetic Order Entry) Lidocaine applied to wound bed Off-Loading Total Contact Cast to Left Lower Extremity Open toe surgical shoe - right Wound Treatment Wound #11 - Foot Wound Laterality: Plantar, Right Cleanser: Soap and Water 3 x Per Week/30 Days Discharge Instructions: Gently cleanse wound with antibacterial soap, rinse and pat dry prior to dressing wounds Cleanser: Wound Cleanser 3 x Per Week/30 Days Discharge Instructions: Wash your hands with soap and water. Remove old dressing, discard into plastic bag and place into trash. Cleanse the JONIQUA, SIDLE [name] ([id number]) [id number] Page 11 of 23 wound with Wound Cleanser prior to applying [name] clean dressing using gauze sponges, not tissues or cotton balls. Do not scrub or use excessive force. Pat dry using gauze sponges, not tissue or cotton balls. Prim Dressing: Aquacel Extra Hydrofiber Dressing, 2x2 (in/in) ary 3 x Per Week/30 Days Secondary Dressing: ABD Pad 5x9 (in/in) 3 x Per Week/30 Days Discharge Instructions: Cover with ABD pad Secondary Dressing: 3 x Per Week/30 Days Secured With: Medipore T - 70M Medipore H Soft Cloth Surgical T ape ape, 2x2 (in/yd) 3 x Per Week/30 Days Secured With: [employer] Sterile or Non-Sterile 6-ply 4.5x4 (yd/yd) 3 x Per Week/30 Days Discharge Instructions: Apply Kerlix as directed  Wound #12 - Foot Wound Laterality: Plantar, Left, Medial Cleanser: Soap and Water 1 x Per Week/30 Days Discharge Instructions: Gently cleanse wound with antibacterial soap, rinse and pat dry prior to dressing wounds Cleanser: Wound Cleanser 1 x Per Week/30 Days Discharge Instructions: Wash your hands with soap and water. Remove old dressing, discard into plastic bag and place into trash. Cleanse the wound with Wound  Cleanser prior to applying [name] clean dressing using gauze sponges, not tissues or cotton balls. Do not scrub or use excessive force. Pat dry using gauze sponges, not tissue or cotton balls. Prim Dressing: Hydrofera Blue Ready Transfer Foam, 2.5x2.5 (in/in) 1 x Per Week/30 Days ary Discharge Instructions: Apply Hydrofera Blue Ready to wound bed as directed Secondary Dressing: Gauze 1 x Per Week/30 Days Discharge Instructions: As directed: dry Secondary Dressing: heel cup 1 x Per Week/30 Days Secured With: Medipore T - 40M Medipore H Soft Cloth Surgical T ape ape, 2x2 (in/yd) 1 x Per Week/30 Days Secured With: [employer] or Non-Sterile 6-ply 4.5x4 (yd/yd) 1 x Per Week/30 Days Discharge Instructions: Apply Kerlix as directed Electronic Signature(s) Signed: 05/10/2023 1:39:39 PM By: Heather Corwin DO Previous Signature: 05/10/2023 12:08:22 PM Version By: [name] Entered By: [name] on 05/10/2023 13:38:45 -------------------------------------------------------------------------------- Problem List Details Patient Name: Date of Service: [name] 05/10/2023 11:00 [name]: [id number] Patient Account Number: [id number] Date of Birth/Sex: Treating RN: 24-May-1976 (47 y.o. 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/2022 No Yes severity [name], [name] ([id number]) [id number] Page 12 of [phone] Non-pressure chronic ulcer of other part of right foot with fat layer exposed 03/16/2022 No Yes E11.621 Type 2 diabetes mellitus with foot ulcer 03/16/2022 No Yes E11.42 Type 2 diabetes mellitus with diabetic polyneuropathy  03/16/2022 No Yes L97.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 03/16/2022 No Yes of skin Z79.4 Long term (current) use of insulin 02/22/2023 No Yes Inactive Problems Resolved Problems Electronic Signature(s) Signed: 05/10/2023 1:39:39 PM By: Heather Corwin DO Entered By: [name] on 05/10/2023 11:37:25 -------------------------------------------------------------------------------- Progress Note Details Patient Name: Date of Service: [name] 05/10/2023 11:00 [name]: [id number] Patient Account Number: [id number] Date of Birth/Sex: Treating RN: 03/18/1976 (47 y.o. [name] Primary Care Provider: [name] Other Clinician: Referring Provider: Treating Provider/Extender: Chapman Moss in Treatment: 85 Subjective Chief Complaint Information obtained from Patient 03/19/2021; patient referred by Dr. Excell Seltzer who has been looking after her left foot for quite [name] period of time for review of [name] nonhealing area in the left midfoot 03/12/2022; bilateral feet wounds and right lower extremity wound. History of Present Illness (HPI) 01/18/18-She is here for initial evaluation of the left great toe ulcer. She is [name] poor historian in regards to timeframe in detail. She states approximately 4 weeks ago she lacerated her toe on something in the house. She followed up with her primary care who placed her on Bactrim and ultimately [name] second dose of Bactrim prior to coming to wound clinic. She states she has been treating the toe with peroxide, Betadine and [name] Band-Aid. She did not check her blood sugar this morning but checked it yesterday morning it was 327; she is unaware  of [name] recent A1c and there are no current records. She saw Dr. she would've orthopedics last week for an old injury to the left ankle, she states he did not see her toe, nor did she bring it to his attention. She smokes approximately  1 pack cigarettes [name] day. Her social situation is concerning, she arrives this morning with her mother who appears extremely intoxicated/under the influence; her mother was asked to leave the room and be monitored by the patient's grandmother. The patient's aunt then accompanied the patient and the room throughout the rest of the appointment. We had [name] lengthy discussion regarding the deleterious effects of uncontrolled hyperglycemia and smoking as it relates to wound healing and overall health. She was strongly encouraged to decrease her smoking and get her diabetes under better control. She states she is currently on [name] diet and has cut down her [facility] consumption. The left toe is erythematous, macerated and slightly edematous with malodor present. The edema in her left foot is below her baseline, there is no erythema streaking. We will treat her with Santyl, doxycycline; we have ordered and xray, culture and provided [name] Peg assist surgical shoe and cultured the wound. 01/25/18-She is here in follow-up evaluation for [name] left great toe ulcer and presents with an abscess to her suprapubic area. She states her blood sugars remain elevated, feeling "sick" and if levels are below 250, but she is trying. She has made no attempt to decrease her smoking stating that we "can't take away her food in her cigarettes". She has been compliant with offloading using the PEG assist you. She is using Santyl daily. the culture obtained last week grew staph aureus and Enterococcus faecalis; continues on the doxycycline and Augmentin was added on Monday. The suprapubic area has erythema, no femoral Talavera, Pailynn [name] ([id number]) [id number] Page 13 of 23 variation, purple discoloration, minimal induration, was accessed with [name] cotton tip applicator with sanguinopurulent drainage, this was cultured, I suspect the current antibiotic treatment will cover and we will not add anything to her current  treatment plan. She was advised to go to urgent care or ER with any change in redness, induration or fever. 02/01/18-She is here in follow-up evaluation for left great toe ulcers and [name] new abdominal abscess from last week. She was able to use packing until earlier this week, where she "forgot it was there". She states she was feeling ill with GI symptoms last week and was not taking her antibiotic. She states her glucose levels have been predominantly less than 200, with occasional levels between 200-250. She thinks this was contributing to her GI symptoms as they have resolved without intervention. There continues to be significant laceration to left toe, otherwise it clinically looks stable/improved. There is now less superficial opening to the lateral aspect of the great toe that was residual blister. We will transition to [facility] to all wounds, she will continue her Augmentin. If there is no change or deterioration next week for reculture. 02/08/18-She is here in follow-up evaluation for left great toe ulcer and abdominal ulcer. There is an improvement in both wounds. She has been wrapping her left toe with coban, not by our direction, which has created an area of discoloration to the medial aspect; she has been advised to NOT use coban secondary to her neuropathy. She states her glucose levels have been high over this last week ranging from 200-350, she continues to smoke. She admits to being less compliant with her offloading shoe.  We will continue with same treatment plan and she will follow-up next week. 02/15/18-She is here in follow-up evaluation for left great toe ulcer and abdominal ulcer. The abdominal ulcer is epithelialized. The left great toe ulcer is improved and all injury from last week using the Coban wrap is resolved, the lateral ulcer is healed. She admits to noncompliance with wearing offloading shoe and admits to glucose levels being greater than 300 most of the week. She  continues to smoke and expresses no desire to quit. There is one area medially that probes deeper than it has historically, erythema to the toe and dorsal foot has consistently waxed and waned. There is no overt signs of cellulitis or infection but we will culture the wound for any occult infection given the new area of depth and erythema. We will hold off on sensitivities for initiation of antibiotic therapy. 02/22/18-She is here in follow up evaluation for left great toe ulcer. There is overall significant improvement in both wound appearance, erythema and edema with changes made last week. She was not initiated on antibiotic therapy. Culture obtained last week showed oxacillin sensitive staph aureus, sensitive to clindamycin. Clindamycin has been called into the pharmacy but she has been instructed to hold off on initiation secondary to overall clinical improvement and her history of antibiotic intolerance. She has been instructed to contact the clinic with any noted changes/deterioration and the wound, erythema, edema and/or pain. She will follow-up next week. She continues to smoke and her glucose levels remain elevated >250; she admits to compliance with offloading shoe 03/01/18 on evaluation today patient appears to be doing fairly well in regard to her left first toe ulcer. She has been tolerating the dressing changes with the [facility] Dressing without complication and overall this has definitely showed signs of improvement according to records as well is what the patient tells me today. I'm very pleased in that regard. She is having no pain today 03/08/18 She is here for follow up evaluation of [name] left great toe ulcer. She remains non-compliant with glucose control and smoking cessation; glucose levels consistently >200. She states that she got new shoe inserts/peg assist. She admits to compliance with offloading. Since my last evaluation there is significant improvement. We will switch to  prisma at this time and she will follow up next week. She is noted to be tachycardic at this appointment, heart rate 120s; she has [name] history of heart rate 70-130 according to our records. She admits to extreme agitation r/t personal issues; she was advised to monitor her heartrate and contact her physician if it does not return to [name] more normal range (<100). She takes cardizem twice daily. 03/15/18-She is here in follow-up evaluation for left great toe ulcer. She remains noncompliant with glucose control and smoking cessation. She admits to compliance with wearing offloading shoe. The ulcer is improved/stable and we will continue with the same treatment plan and she will follow-up next week 03/22/18-She is here for evaluation for left great toe ulcer. There continues to be significant improvement despite recurrent hyperglycemia (over 500 yesterday) and she continues to smoke. She has been compliant with offloading and we will continue with same treatment plan and she will follow-up next week. 03/29/18-She is here for evaluation for left great toe ulcer. Despite continuing to smoke and uncontrolled diabetes she continues to improve. She is compliant with offloading shoe. We will continue with the same treatment plan and she will follow-up next week 04/05/18- She is here in follow  up evaluation for [name] left great toe ulcer; she presents with small pustule to left fifth toe (resembles ant bite). She admits to compliance with wearing offloading shoe; continues to smoke or have uncontrolled blood glucose control. There is more callus than usual with evidence of bleeding; she denies known trauma. 04/12/18-She is here for evaluation of left great toe ulcer. Despite noncompliance with glycemic control and smoking she continues to make improvement. She continues to wear offloading shoe. The pustule, that was identified last week, to the left fifth toe is resolved. She will follow-up in 2 weeks 05/03/18-she is seen in  follow-up evaluation for [name] left great toe ulcer. She is compliant with offloading, otherwise noncompliant with glycemic control and smoking. She has plateaued and there is minimal improvement noted. We will transition to [facility], replaced the insert to her surgical shoe and she will follow-up in one week 05/10/18- She is here in follow up evaluation for [name] left great toe ulcer. It appears stable despite measurement change. We will continue with same treatment plan and follow up next week. 05/24/18-She is seen in follow-up evaluation for [name] left great toe ulcer. She remains compliant with offloading, has made significant improvement in her diet, decreasing the amount of sugar/soda. She said her recent A1c was 10.9 which is lower than. She did see [name] diabetic nutritionist/educator yesterday. She continues to smoke. We will continue with the same treatment plan and she'll follow-up next week. 05/31/18- She is seen in follow-up evaluation for left great toe ulcer. She continues to remain compliant with offloading, continues to make improvement in her diet, increasing her water and decreasing the amount of sugar/soda. She does continue to smoke with no desire to quit. We will apply Prisma to the depth and Hydrofera Blue over. We have not received insurance authorization for oasis. She will follow up next week. 06/07/18-She is seen in follow-up evaluation for left great toe ulcer. It has stalled according to today's measurements although base appears stable. She says she saw [name] diabetic educator yesterday; her average blood sugars are less than 300 which is an improvement for her. She continues to smoke and states "that's my next step" She continues with water over soda. We will order for xray, culture and reinstate ace wrap compression prior to placing apligraf for next week. She is voicing no complaints or concerns. Her dressing will change to iodoflex over the next week in preparation for  apligraf. 06/14/18-She is seen in follow-up evaluation for left great toe ulcer. Plain film x-ray performed last week was negative for osteomyelitis. Wound culture obtained last week grew strep B and OSSA; she is initiated on keflex and cefdinir today; there is erythema to the toe which could be from ace wrap compression, she has [name] history of wrapping too tight and has has been encouraged to maintain ace wraps that we place today. We will hold off on application of apligraf today, will apply next week after antibiotic therapy has been initiated. She admits today that she has resumed taking [name] shower with her foot/toe submerged in water, she has been reminded to keep foot/toe out of the bath water. She will be seen in follow up next week 06/21/18-she is seen in follow-up evaluation for left great toe ulcer. She is tolerating antibiotic therapy with no GI disturbance. The wound is stable. Apligraf was applied today. She has been decreasing her smoking, only had 4 cigarettes yesterday and 1 today. She continues being more compliant in diabetic diet. She will  follow-up next week for evaluation of site, if stable will remove at 2 weeks. 06/28/18- She is here in follow up evalution. Apligraf was placed last week, she states the dressing fell off on Tuesday and she was dressing with hydrofera blue. She is healed and will be discharged from the clinic today. She has been instructed to continue with smoking cessation, continue monitoring glucose levels, offloading for an additional 4 weeks and continue with hydrofera blue for additional two weeks for any possible microscopic opening. Readmission: 08/07/18 on evaluation today patient presents for reevaluation concerning the ulcer of her right great toe. She was previously discharged on 06/28/18 healed. Nonetheless she states that this began to show signs of drainage she subsequently went to her primary care provider. Subsequently an x-ray was performed on 08/01/18  which was negative. The patient was also placed on antibiotics at that time. Fortunately they should have been effective for the infection. Nonetheless she's been experiencing some improvement but still has [name] lot of drainage coming from the wound itself. 08/14/18 on evaluation today patient's wound actually does show signs of improvement in regard to the erythema at this point. She has completed the antibiotics. With that being said we did discuss the possibility of placing her in [name] total contact cast as of today although I think that I may want to give this just [name] little bit more time to ensure nothing recurrence as far as her infection is concerned. I do not want to put in the cast and risk infection at that time if things are not completely resolved. With that being said she is gonna require some debridement today. 08/21/18 on evaluation today patient actually appears to be doing okay in regard to her toe ulcer. She's been tolerating the dressing changes without complication. With that being said it does appear that she is ready and in fact I think it's appropriate for [place] to go ahead and initiate the total contact cast today. Nonetheless she will require some sharp debridement to prepare the wound for application. Overall I feel like things have been progressing well but we do need to do something to get this to close more readily. 08/24/18 patient seen today for reevaluation after having had the total contact cast applied on Tuesday. She seems to have done very well the wound appears to be doing great and overall I'm pleased with the progress that she's made. There were no abnormal areas of rubbing from the cast on her lower extremity. TARNISHA, KACHMAR [name] ([id number]) [id number] Page 14 of 23 08/30/18 on evaluation today patient actually appears to be completely healed in regard to her plantar toe ulcer. She tells me at this point she's been having [name] lot of issues with the  cast. She almost fell [name] couple of times the state shall the step of her dog [name] couple times as well. This is been [name] very frustrating process for her other nonetheless she has completely healed the wound which is excellent news. Overall there does not appear to be the evidence of infection at this time which is great news. 09/11/18 evaluation today patient presents for follow-up concerning her great toe ulcer on the left which has unfortunately reopened since I last saw her which was only [name] couple of weeks ago. Unfortunately she was not able to get in to get the shoe and potentially the AFO that's gonna be necessary due to her left foot drop. She continues with offloading shoe but this is not enough to prevent her  from reopening it appears. When we last had her in the total contact cast she did well from [name] healing standpoint but unfortunately the wound reopened as soon as she came out of the cast within just [name] couple of weeks. 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 This is also part of the reason why she falls on [name] regular basis. I just do not believe that is gonna be safe for [place] to reinitiate the total contact cast as last time we had this on she fell 3 times one week which is definitely not normal for her. 09/18/18 upon evaluation today the patient actually appears to be doing about the same in regard to her toe ulcer. She did not contact [employer] as I asked her to even though I had given her the prescription. In fact she actually states that she has no idea where the prescription is. She did apparently call [employer] and they told her that all she needed to do was bring the prescription in order to be able to be seen and work on getting the AFO for her left foot. With all that being said she still does not  have an appointment and I'm not sure were things stand that regard. I will give her [name] new prescription today in order to contact them to get this set up. 09/25/18 on evaluation today patient actually appears to be doing about the same in regard to her toes ulcer. She does have [name] small areas which seems to have [name] lot of callous buildup around the edge of the wound which is going to need sharp debridement today. She still is waiting to be scheduled for evaluation with [employer] for possibility of an AFO. She states there supposed to call her tomorrow to get this set up. Unfortunately it does appear that her foot specifically the toe area is showing signs of erythema. There does not appear to be any systemic infection which is in these good news. 10/02/18 on evaluation today patient actually appears to be doing about the same in regard to her toe ulcer. This really has not done too well although it's not significantly larger it's also not significantly smaller. She has been tolerating the dressing changes without complication. She actually has her appointment with [employer] and [place] tomorrow to hopefully be measured for obtaining and AFO splint. I think this would be helpful preventing this from reoccurring. We had contemplated starting the cast this week although to be honest I am reluctant to do that as she's been having nausea, vomiting, and seizure activity over the past three days. She has [name] history of seizures and have been told is nothing that can be done for these. With that being said I do believe that along with the seizures have the nausea vomiting which upon further questioning doesn't seem to be the normal for her and makes me concerned for the possibility of infection or something else going on. I discussed this with the patient and her mother during the office visit today. I do not feel the wound is effective but maybe something else. The responses this was "this just happens to her at times  and we don't know why". They did not seem to be interested in going to the hospital to have this  checked out further. 10/09/18 on evaluation today patient presents for follow-up concerning her ongoing toe ulcer. She has been tolerating the dressing changes without complication. Fortunately there does not appear to be any evidence of infection which is great news however I do think that the patient would benefit from going ahead for with the total contact cast. She's actually in [name] wheelchair today she tells me that she will use her walker if we initiate the cast. I was very specific about the fact that if we were gonna do the cast I wanted to make sure that she was using the walker in order to prevent any falls. She tells me she does not have stairs that she has to traverse on [name] regular basis at her home. She has not had any seizures since last week again that something that happens to her often she tells me she did talk to [employer] and they said that it may take up to three weeks to get the brace approved for her. Hopefully that will not take that long but nonetheless in the meantime I do think the cast could be of benefit. 10/12/18 on evaluation today patient appears to be doing rather well in regard to her toe ulcer. It's just been [name] few days and already this is significantly improved both as far as overall appearance and size. Fortunately there's no sign of infection. She is here for her first obligatory cast change. 10/19/18 Seen today for follow up and management of left great toe ulcer. Wound continues to show improvement. Noted small open area with seroussang drainage with palpation. Denies any increased pain or recent fevers during visit. She will continue calcium alginate with offloading shoe. Denies any questions or concerns during visit. 10/26/18 on evaluation today patient appears to be doing about the same as when I last saw her in regard to her wound bed. Fortunately there does not appear  to be any signs of infection. Unfortunately she continues to have [name] breakdown in regard to the toe region any time that she is not in the cast. It takes almost no time at all for this to happen. Nonetheless she still has not heard anything from the brace being made by [employer] as to when exactly this will be available to her. Fortunately there is no signs of infection at this time. 10/30/18 on evaluation today patient presents for application of the total contact cast as we just received him this morning. Fortunately we are gonna be able to apply this to her today which is great news. She continues to have no significant pain which is good news. Overall I do feel like things have been improving while she was the cast is when she doesn't have [name] cast that things get worse. She still has not really heard anything from [employer] regarding her brace. 11/02/18 upon evaluation today patient's wound already appears to be doing significantly better which is good news. Fortunately there does not appear to be any signs of infection also good news. Overall I do think the total contact cast as before is helping to heal this area unfortunately it's just not gonna likely keep the area closed and healed without her getting her brace at least. Again the foot drop is [name] significant issue for her. 11/09/18 on evaluation today patient appears to be doing excellent in regard to her toe ulcer which in fact is completely healed. Fortunately we finally got the situation squared away with the paperwork which was needed to proceed with getting  her brace approved by Medicaid. I have filled that out unfortunately that information has been sent to the orthopedic office that I worked at 2 1/2 years ago and not tired Current wound care measures. Fortunately she seems to be doing very well at this time. 11/23/18 on evaluation today patient appears to be doing More Poorly Compared to Last Time I Saw Her. At [facility] She Had Completely  Healed. Currently she is continuing to have issues with reopening. She states that she just found out that the brace was approved through Medicaid now she just has to go get measured in order to have this fitted for her and then made. Subsequently she does not have an appointment for this yet that is going to complicate things we obviously cannot put her back in the cast if we do not have everything measured because they're not gonna be able to measure her foot while she is in the cast. 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 Obviously this is unfortunate and does have me somewhat worried as well. 11/30/18 on evaluation today patient's toe ulcer actually appears to be doing fairly well. The good news is she will be getting her brace in the shoes next week on Wednesday. Hopefully we will be able to get this to heal without having to go back in the cast however she may need the cast in order to get the wound completely heal and then go from there. Fortunately there's no signs of infection at this time. 12/07/18 on evaluation today patient fortunately did receive her brace and she states she could tell this definitely makes her walk better. With that being said she's been having issues with her toe where she noticed yesterday there was [name] lot of tissue that was loosing off this appears to be much larger than what it was previous. She also states that her leg has been read putting much across the top of her foot just about the ankle although this seems to be receiving somewhat. The total area is still red and appears to be someone infected as best I can tell. She is previously taken Bactrim and that may be [name] good option for her today as well. We are gonna see what I wound culture shows as well and I think that this is definitely appropriate. With that being said outside of the culture I still need to initiate  something in the interim and that's what I'm gonna go ahead and select Bactrim is [name] good option for her. 12/14/18 on evaluation today patient appears to be doing better in regard to her left great toe ulcer as compared to last week's evaluation. There's still some erythema although this is significantly improved which is excellent news. Overall I do believe that she is making good progress is still gonna take some time before she is where I would like her to be from the standpoint of being able to place her back into the total contact cast. Hopefully we will be where we need to be by next week. 12/21/18 on evaluation today patient actually appears to be doing poorly in regard to her toe ulcer. She's been tolerating the dressing changes without complication. Fortunately there's no signs of systemic infection although she does have [name] lot of drainage from the toe ulcer and this does seem to be causing [name], [name] ([id number]) [id number] Page 15 of  23 some issues at this point. She does have erythema on the distal portion of her toe that appears to be likely cellulitis. 12/28/18 on evaluation today patient actually appears to be doing [name] little better in my pinion in regard to her toe ulcer. With that being said she still does have some evidence of infection at this time and for her culture she had both E. coli as well as enterococcus as organisms noted on evaluation. For that reason I think that though the Keflex likely has treated the E. coli rather well this has really done nothing for the enterococcus. We are going to have to initiate treatment for this specifically. 01/04/19 on evaluation today patient's toe actually appears to be doing better from the standpoint of infection. She currently would like to see about putting the cash back on I think that this is appropriate as long as she takes care of it and keeps it from getting wet. She is gonna have some drainage we can  definitely pass this up with Drawtex and alginate to try to prevent as much drainage as possible from causing the problems. With that being said I do want to at least try her with the cast between now and Tuesday. If there any issues we can't continue to use it then I will discontinue the use of the cast at that point. 01/08/19 on evaluation today patient actually appears to be doing very well as far as her foot ulcer specifically the great toe on the left is concerned. She did have an area of rubbing on the medial aspect of her left ankle which again is from the cast. Fortunately there's no signs of infection at this point in this appears to be [name] very slight skin breakdown. The patient tells me she felt it rubbing but didn't think it was that bad. Fortunately there is no signs of active infection at this time which is good news. No fevers, chills, nausea, or vomiting noted at this time. 01/15/19 on evaluation today patient actually appears to be doing well in regard to her toe ulcer. Again as previous she seems to do well and she has the cast on which indicates to me that during the time she doesn't have [name] cast on she's putting way too much pressure on this region. Obviously I think that's gonna be an issue as with the current national emergency concerning the Covid-19 Virus it has been recommended that we discontinue the use of total contact casting by the chief medical officer of our company, Dr. Maurine Minister. The reasoning is that if [name] patient becomes sick and cannot come into have the cast removed they could not just leave this on for an additional two weeks. Obviously the hospitals also do not want to receive patient's who are sick into the emergency department to potentially contaminate the region and spread the Covid-19 Virus among other sick individuals within the hospital system. Therefore at this point we are suspending the use of total contact cast until the current emergency subsides. This was all  discussed with the patient today as well. 01/22/19 on evaluation today patient's wound on her left great toe appears to be doing slightly worse than previously noted last week. She tells me that she has been on this quite [name] bit in fact she tells me she's been awake for 38 straight hours. This is due to the fact that she's having to care for grandparents because nobody else will. She has been taking care of them for  five the last seven days since I've seen her they both have dementia his is from [name] stroke and her grandmother's was progressive. Nonetheless she states even her mom who knows her condition and situation has only help two of those days to take care of them she's been taking care of the rest. Fortunately there does not appear to be any signs of active infection in regard to her toe at this point although obviously it doesn't look as good as it did previous. I think this is directly related to her not taking off the pressure and friction by way of taking things easy. Though I completely understand what's going on. 01/29/19 on evaluation today patient's tools are actually appears to be showing some signs of improvement today compared to last week's evaluation as far as not necessarily the overall size of the wound but the fact that she has some new skin growth in between the two ends of the wound opening. Overall I feel like she has done well she states that she had [name] family member give her what sounds to be [name] CAM walker boot which has been helpful as well. 02/05/19 on evaluation today patient's wound bed actually appears to be doing significantly better in regard to her overall appearance of the size of the wound. With that being said she is still having an issue with offloading efficiently enough to get this to close. Apparently there is some signs of infection at this point as well unfortunately. Previously she's done well of Augmentin I really do not see anything that needs to be culture  currently but there theme and cellulitis of the foot that I'm seeing I'm gonna go ahead and place her on an antibiotic today to try to help clear this up. 02/12/2019 on evaluation today patient actually appears to be doing poorly in regard to her overall wound status. She tells me she has been using her offloading shoe but actually comes in today wearing her tennis shoe with the AFO brace. Again as I previously discussed with her this is really not sufficient to allow the area to heal appropriately. Nonetheless she continues to be somewhat noncompliant and I do wonder based on what she has told my nurse in the past as to whether or not [name] good portion of this noncompliance may be recreational drug and alcohol related. She has had [name] history of heroin overdose and this was fairly recently in the past couple of months that have been seeing her. Nonetheless overall I feel like her wound looks significantly worse today compared to what it was previous. She still has significant erythema despite the Augmentin I am not sure that this is an appropriate medication for her infection I am also concerned that the infection may have gone down into her bone. 02/19/19 on evaluation today patient actually appears to be doing about the same in regard to her toe ulcer. Unfortunately she continues to show signs of bone exposure and infection at this point. There does not appear to be any evidence of worsening of the infection but I'm also not really sure that it's getting significantly better. She is on the Augmentin which should be sufficient for the Staphylococcus aureus infection that she has at this point. With that being said she may need IV antibiotics to more appropriately treat this. We did have [name] discussion today about hyperbaric option therapy. 02/28/19 on evaluation today patient actually appears to be doing much worse in regard to the wound on her left  great toe as compared to even my previous evaluation last  week. Unfortunately this seems to be training in [name] pretty poor direction. Her toe was actually now starting to angle laterally and I can actually see the entire joint area of the proximal portion of the digit where is the distal portion of the digit again is no longer even in contact with the joint line. Unfortunately there's [name] lot more necrotic tissue around the edge and the toe appears to be showing signs of becoming gangrenous in my pinion. I'm very concerned about were things stand at this point. She did see infectious disease and they are planning to send in [name] prescription for Sivextro for her and apparently this has been approved. With that being said I don't think she should avoid taking this but at the same time I'm not sure that it's gonna be sufficient to save her toe at this point. She tells me that she still having to care for grandparents which I think is putting quite [name] bit of strain on her foot and specifically the total area and has caused this to break down even to [name] greater degree than would've otherwise been expected. 03/05/19 on evaluation today patient actually appears to be doing quite well in regard to her toe all things considering. She still has bone exposed but there appears to be much less your thing on overall the appearance of the wound and the toe itself is dramatically improved. She still does have some issues currently obviously with infection she did see vascular as well and there concerned that her blood flow to the toad. For that reason they are setting up for an angiogram next week. 03/14/19 on evaluation today patient appears to be doing very poor in regard to her toe and specifically in regard to the ulceration and the fact that she's starting to notice the toe was leaning even more towards the lateral aspect and the complete joint is visible on the proximal aspect of the joint. Nonetheless she's also noted [name] significant odor and the tip of the toe is turning more  dark and necrotic appearing. Overall I think she is getting worse not better as far as this is concerned. For that reason I am recommending at this point that she likely needs to be seen for likely amputation. READMISSION 03/19/2021 This is [name] patient that we cared for in this clinic for [name] prolonged period of time in 2019 and 2020 with [name] left foot and left first toe wound. 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 Excell Seltzer. In December 2021 she had an ulcer on her right great toe as well as the fourth and fifth toes. 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 She also had an angiogram at that time and underwent angioplasty of the right anterior tibial artery. In any case she claims that the wound on the right foot is closed I did not look at this today which was probably an oversight although I think that should be done next week. 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 According to Dr. Bernette Redbird last review that she was out of the area being cared for by another physician but recently came back to his attention. 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 [name] culture of this  area showed E. coli apparently before she came back to see Dr. Excell Seltzer she was supposed to be receiving antibiotics but she did not really take them. Nor is she offloading this area at all. Finally her last hemoglobin A1c listed in epic was in March 2022 at 14.1 she says things are [name] lot better since then although I am not sure. 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 She had this wound at the time. During this admission she had E. coli sepsis [name] TEE was negative. Past medical history is extensive and includes type 2 diabetes with peripheral  neuropathy cardiomyopathy with an ejection fraction of 33%, hypertension, hyperlipidemia chronic renal failure stage III history of substance abuse with cocaine although she claims to be clean now verified by her mother. She is still [name] SHEQUITA, PEPLINSKI [name] ([id number]) [id number] Page 16 of 23 heavy cigarette smoker. She has [name] history of bipolar disorder seizure disorder ABI in our clinic was 1.05 6/1; left midfoot in the setting of [name] TMA done previously. Round circular wound with [name] "knuckle" of protruding tissue. The problem is that the knuckle was not attached to any of the surrounding granulation and this probed proximally widely I removed [name] large portion of this tissue. This wound goes with considerable undermining laterally. I do not feel any bone there was no purulence but this is [name] deep wound. 6/8; in spite of the debridement I did last week. She arrives with [name] wound looking exactly the same. [name] protruding "knuckle" of tissue nonadherent to most of the surrounding tissue. There is considerable depth around this from 6-12 o'clock at 2.7 cm and undermining of 1 cm. This does not look overtly infected and the x- ray I did last week was negative for any osseous abnormalities. We have been using silver collagen 6/15; deep tissue culture I did last week showed moderate staph aureus and moderate Pseudomonas. This will definitely require prolonged antibiotic therapy. The pathology on the protuberant area was negative for malignancy fungus etc. 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 We have been using silver collagen. I am going to be prescribing Levaquin for 2 weeks. Her CT scan of the foot is down for 7/5 6/22; CT scan of the foot on 7 5. She says she has hardware in the left leg from her previous fracture. She is on the Levaquin for the deep tissue culture I did that showed methicillin sensitive staph aureus and  Pseudomonas. I gave her [name] 2-week supply and she will have another week. She arrives in clinic today with the same protuberant tissue however this is nonadherent to the tissue surrounding it. I am really at [name] loss to explain this unless there is underlying deep tissue infection 6/29; patient presents for 1 week follow-up. She has been using collagen to the wound bed. She reports taking her antibiotics as prescribed.She has no complaints or issues today. She denies signs of infection. 7/6; patient presents for one week followup. She has been using collagen to the wound bed. She states she is taking Levaquin however at times she is not able to keep it down. She denies signs of infection. 7/13; patient presents for 1 week follow-up. She has been using silver alginate to the wound bed. She still has nausea when taking her antibiotics. She denies signs of infection. 7/20; patient presents for 1 week follow-up. She has been using silver alginate with  gentamicin cream to the wound bed. She denies any issues and has no complaints today. She denies signs of infection. 7/27; patient presents for 1 week follow-up. She continues to use silver alginate with gentamicin cream to the wound bed. She reports starting her antibiotics. She has no issues or complaints. Overall she reports stability to the wound. 8/3; patient presents for 1 week follow-up. She has been using silver alginate with gentamicin cream to the wound bed. She reports completing all antibiotics. She has no issues or complaints today. She denies signs of infection. 8/17; patient presents for 2-week follow-up. He is to use silver alginate to the wound bed. She has no issues or complaints today. She denies signs of infection. She reports her pain has improved in her foot since last clinic visit 8/24; patient presents for 1 week follow-up. She continues to use silver alginate to the wound bed. She has no issues or complaints. She denies signs  of infection. Pain is stable. 9/7; patient presents for follow-up. She missed her last week appointment due to feeling ill. She continues to use silver alginate. She has [name] new wound to the right lower extremity that is covered in eschar. She states It occurred over the past week and has no idea how it started. She currently denies signs of infection. 9/14; patient presents for follow-up. T the left foot wound she has been using gentamicin cream and silver alginate. T the right lower extremity wound she has o o been keeping this covered and has not obtain Santyl. 9/21; patient presents for follow-up. She reports using gentamicin cream and silver alginate to the left foot and Santyl to the right lower extremity wound. She has no issues or complaints today. She denies signs of infection. 9/28; patient presents for follow-up. She reports [name] new wound to her right heel. She states this occurred [name] few days ago and is progressively gotten worse. She has been trying to clean the area with [name] Q-tip and Santyl. She reports stability in the other 2 wounds. She has been using gentamicin cream and silver alginate to the left foot and Santyl to the right lower extremity wound. 10/12; patient presents for follow-up. She reports improvement to the wound beds. She is seeing vein and vascular to discuss the potential of [name] left BKA. She states they are going to do an arteriogram. She continues to use silver alginate with dressing changes to her wounds. 11/2; patient presents for follow-up. She states she has not been doing dressing changes to the wound beds. She states she is not able to offload the areas. She reports chronic pain to her left foot wound. 11/9; patient presents for follow-up. She came in with only socks on. She states she forgot to put on shoes. It is unclear if she is doing any dressing changes. She currently denies systemic signs of infection. 11/16; patient presents for follow-up. She came again  only with socks on. She states she does not wear shoes ever. It is unclear if she does dressing changes. She currently denies systemic signs of infection. 11/23; patient presents for follow-up. She wore her shoes today. It still unclear exactly what dressing she is using for each wound but she did states she obtained Dakin's solution and has been using this to the left foot wound. She currently denies signs of infection. 11/30; patient presents for follow-up. She has no issues or complaints today. She currently denies signs of infection. 12/7; patient presents for follow-up. She has no issues or  complaints today. She has been using Hydrofera Blue to the right heel wound and Dakin solution to the left foot wound. Her right anterior leg wound is healed. She currently denies signs of infection. 12/14; patient presents for follow-up. She has been using Hydrofera Blue to the right heel and Dakin's to the left foot wounds. She has no issues or complaints today. She denies signs of infection. 12/21; patient presents for follow-up. She reports using Hydrofera Blue to the right heel and Dakin's to the left foot wound. She denies signs of infection. 12/28; patient presents for follow-up. She continues to use Dakin's to the left foot wound and Hydrofera Blue to the right heel wound. She denies signs of infection. 1/4; patient presents for follow-up. She has no issues or complaints today. She denies signs of infection. 1/11; patient presents for follow-up. It is unclear if she has been dressing these wounds over the past week. She currently denies signs of infection. 1/18; patient presents for follow-up. She states she has been using Dakin's wet-to-dry dressings to the left foot. She has been using Hydrofera Blue to the right foot foot wound. She states that the anterior right leg wound has reopened and draining serous fluid. She denies signs of infection. 1/25; patient presents for follow-up. She has no issues  or complaints today. 2/1; patient presents for follow-up. She has no issues or complaints today. She denies signs of infection. 2/8; patient presents for follow-up. She has lost her surgical shoes. She did not have [name] dressing to the right heel wound. She currently denies signs of infection. 2/15; patient presents for follow-up. She reports more pain to the right heel today. She denies purulent drainage Or fever/chills 2/22; patient presents for follow-up. She reports taking clindamycin over the past week. She states that she continues to have pain to her right heel. She reports purulent drainage. [name], [name] [name] ([id number]) [id number] Page 17 of 23 Readmission 03/16/2022 Ms. Zarahi Fuerst is [name] 47 year old female with [name] past medical history of type 2 diabetes, osteomyelitis to her feet, chronic systolic heart failure and bipolar disorder that presents to the clinic for bilateral feet wounds and right lower extremity wound. She was last seen in our clinic on 12/15/2021. At that time she had purulent drainage coming out of her right plantar foot and I recommended she go to the ED. She states she went to [facility] and has been there for the past 3 months. I cannot see the records. She states she had OR debridement and was on several weeks of IV antibiotics while inpatient. Since discharge she has not been taking care of the wound beds. She had nothing on her feet other than socks today. She currently denies signs of infection. 5/31; patient presents for follow-up. She has been using Dakin's wet-to-dry dressings to the wound beds on her feet bilaterally and antibiotic ointment to the right anterior leg wound. She had [name] wound culture done at last clinic visit that showed moderate Pseudomonas aeruginosa sensitive to ciprofloxacin. She currently denies systemic signs of infection. 6/14; patient presents for follow-up. She received Keystone 5 days ago and has been  using this on the wound beds. She states that last week she had to go to the hospital because she had increased warmth and erythema to the right foot. She was started on 2 oral antibiotics. She states she has been taking these. She currently denies systemic signs of infection. She has no issues or complaints today. 6/21; patient presents for follow-up.  She states she has been using Keystone antibiotics to the wound beds. She has no issues or complaints today. She denies signs of infection. 6/28; patient presents for follow-up. She has been using Keystone antibiotics to the wound beds. She has no issues or complaints today. 7/12; patient presents for follow-up. Has been using Keystone antibiotics to the wound beds with calcium alginate. She has no issues or complaints today. She never followed up with her orthopedic surgeon who did the OR debridement to the right foot. We discussed the total contact cast for the left foot and patient would like to do this next week. 7/19; patient presents for follow-up. She has been using Keystone antibiotics with calcium alginate to the wound beds. She has no issues or complaints today. Patient is in agreement to do the total contact cast of the left foot today. She knows to return later this week for the obligatory cast change. 05-13-2022 upon evaluation today patient's wound which she has the cast of the left leg actually appears to be doing significantly better. Fortunately I do not see any signs of active infection locally or systemically which is great news and overall I am extremely pleased with where we stand currently. 7/26; patient presents for follow-up. She has [name] cast in place for the past week. She states it irritated her shin. Other than that she tolerated the cast well. She states she would like [name] break for 1 week from the cast. We have been using Keystone antibiotic and Aquacel to both wound beds. She denies signs of infection. 8/2; patient presents for  follow-up. She has been using Keystone and Aquacel to the wound beds. She denies any issues and has no complaints. She is agreeable to have the cast placed today for the left leg. 06-03-2022 upon evaluation today patient appears to be doing well with regard to her wound she saw some signs of improvement which is great news. Fortunately I do not see any evidence of active infection locally or systemically at this time which is great news. No fevers, chills, nausea, vomiting, or diarrhea. 8/16; patient presents for follow-up. She has no issues or complaints today. We have been using Keystone and Aquacel to the wound beds. The left lower extremity is in [name] total contact cast. She is tolerated this well. 8/23; patient presents for follow-up. She has had the total contact cast on the left leg for the past week. Unfortunately this has rubbed and broken down the skin to the medial foot. She currently denies signs of infection. She has been using Keystone antibiotic to the right plantar foot wound. 8/30; patient presents for follow-up. We have held off on the total contact cast for the left leg for the past week. Her wound on the left foot has improved and the previous surrounding breakdown of skin has epithelialized. She has been using Keystone antibiotic to both wound beds. She has no issues or complaints today. She denies signs of infection. 9/6; patient presents for follow-up. She has ordered her's Keystone antibiotic and this is arriving this week. She has been doing Dakin's wet-to-dry dressings to the wound beds. She denies signs of infection. She is agreeable to the total contact cast today. 9/13; patient presents for follow-up. She states that the cast caused her left leg shin to be sore. She would like to take [name] break from the cast this week. She has been using Keystone antibiotic to the right plantar foot wound. She denies signs of infection. 9/20; patient presents for  follow-up. She has been using  Keystone antibiotics to the wound beds with calcium alginate to the right foot wound and Hydrofera Blue to the left foot wound. She is agreeable to having the cast placed today. She has been approved for Apligraf and we will order this for next clinic visit. 9/27; patient presents for follow-up. We have been using Keystone antibiotic with Hydrofera Blue to the left foot wound under [name] total contact cast. T the right o foot wound she has been using Keystone antibiotic and calcium alginate. She declines [name] total contact cast today. Apligraf is available for placement and she would like to proceed with this. 07-28-2022 upon evaluation today patient appears to be doing well currently in regard to her wound. She is actually showing signs of significant improvement which is great news. Fortunately I do not see any evidence of active infection locally nor systemically at this time. She has been seeing Dr. Mikey Bussing and to be honest has been doing very well with the cast. Subsequently she comes in today with [name] cast on and we did reapply that today as well. She did not really want to she try to talk me out of that but I explained that if she wanted to heal this is really the right way to go. Patient voiced understanding. In regard to her right foot this is actually [name] lot better compared to the last time I saw her which is also great news. 10/11; patient presents for follow-up. Apligraf and the total contact cast was placed to the left leg at last clinic visit. She states that her right foot wound had burning pain to it with the placement of Apligraf to this area. She has been doing [place] over this area. She denies signs of infection including increased warmth, erythema or purulent drainage. 11/1; 3-week follow-up. The patient fortunately did not have [name] total contact cast or an Apligraf and on the left foot. She has been using Keystone ABD pads and kerlix and her own running shoes She arrives in clinic today  with thick callus and [name] very poor surface on the left foot on the right nonviable skin subcutaneous tissue and [name] deep probing hole. 11/15; patient missed her last clinic appointment. She states she has not been dressing the wound beds for the past 2 weeks. She states that at she had [name] new roommate but is now going back to live with her mother. Apparently its been [name] distracting 2 weeks. Patient currently denies signs of infection. 11/22; patient presents for follow-up. She states she has been using Keystone antibiotic and Dakin's wet-to-dry dressings to the wound beds. She is agreeable for cast placement today. We had ordered Apligraf however this has not been received by our facility. 11/29; Patient had [name] total contact cast placed at last clinic visit and she tolerated this well. We were using silver alginate under the cast. Patient's been using Keystone antibiotic with Aquacel to the right plantar foot wound. She has no issues or complaints today. Apligraf is available for placement today. Patient would like to proceed with this. 12/6; patient presents for follow-up. She had Apligraf placed in standard fashion last clinic visit under the total contact cast to the left lower extremity. She has been using Keystone antibiotic and Aquacel to the right plantar foot wound. She has no issues or complaints today. AMYRA, VANTUYL [name] ([id number]) [id number] Page 18 of 23 12/13; patient presents for follow-up. She has finished 5 Apligraf placements. Was told she would not qualify  for more. We have been doing [name] total contact cast to the left lower extremity. She has been using Keystone antibiotic and Aquacel to the right plantar foot wound. She has no issues or complaints today. 12/20; patient presents for follow-up. We have been using Hydrofera Blue with Keystone antibiotic under [name] total contact cast of the left lower extremity. She reports using Keystone antibiotic and silver alginate  to the right heel wound. She has no issues or complaints today. 12/27; patient presents with [name] healthy wound on the left midfoot. We have Apligraf to apply that to that more also using [name] total contact cast. On the right we are using Keystone and silver alginate. She is offloading the right heel with [name] surgical shoealthough by her admission she is on her feet quite [name] bit 1/3; patient presents for follow-up. Apligraf was placed to the wound beds last clinic visit. She was placed in [name] total contact cast to the left lower extremity. She declines [name] total contact cast today. She states that her mother is in the hospital and she cannot adequately get around with the cast on. 1/10; patient presents for follow-up. She declined the total contact cast at last clinic visit. Both wounds have declined in appearance. She states that she has been on her feet and not offloading the wound beds. She currently denies signs of infection. 1/17; patient presents for follow-up. She had the total contact cast along with Apligraf placed last week to the left lower extremity. She tolerated this well. She has been using Aquacel Ag and Keystone antibiotic to the right heel wound. She currently denies signs of infection. She has no issues or complaints today. 1/24; patient presents for follow-up. We have been using Apligraf to the left foot wound along with [name] total contact cast. She has done well with this. T the right o heel wound she has been using Aquacel Ag and Keystone antibiotic ointment. She has no issues or complaints today. She denies signs of infection. 1/31; patient presents for follow-up. We have been using Apligraf to the left foot wound along with the total contact cast. She continues to do well with this. To the right heel we have been using Aquacel Ag and Keystone antibiotic ointment. She has no issues or complaints today. 12-01-2022 upon evaluation patient is seen today on my schedule due to the fact that she  unfortunately was in the hospital yesterday. Her cast needed to come off the only reason she is out of the hospital is due to the fact that they would not take it off at the hospital which is somewhat bewildered reading to me to be perfectly honest. I am not certain why this was but either way she was released and then was placed on my schedule today in order to get this off and reapply the total contact casting as appropriate. I do not have an Apligraf for her today it was applied last week and today's actually expired yesterday as there was some scheduling conflicts with her being in the hospital. Nonetheless we do not have that for reapplication today but the good news that she is not draining too much and the Apligraf can go for up to 2 weeks so I am going to go ahead and reapply the total contact casting but we are going to leave the Apligraf in place. 2/14; patient presents for follow-up. T the left leg she has had the total contact cast and Apligraf for the past week. She has had no  issues with this. T the o o right heel she has been using antibiotic ointment and Aquacel Ag. 2/21; patient presents for follow-up. We have been using Apligraf and [name] total contact cast to the left lower extremity. She is tolerated this well. Unfortunately she is not approved for any more Apligraf per insurance. She has been using antibiotic ointment and Aquacel Ag to the right foot. She has no issues or complaints today. 2/28; patient presents for follow-up. We have been using Hydrofera Blue and antibiotic ointment under the total contact cast to the left lower extremity. He has been using Aquacel Ag with antibiotic ointment to the right plantar foot. She has no issues or complaints today. 3/6; patient presents for follow-up. She did not obtain her gentamicin ointment. She has been using Aquacel Ag to the right plantar foot wound. We have been using Hydrofera Blue with antibiotic ointment under the total contact  cast to the left lower extremity. She has no issues or complaints today. 3/12; patient presents for follow-up. She has been using gentamicin ointment and Aquacel Ag to the right plantar foot wound. We have been using Hydrofera Blue with antibiotic ointment under the total contact cast on the left lower extremity foot wound. She has no issues or complaints today. 3/20; patient presents for follow-up. She has been using gentamicin ointment and Aquacel Ag to the right plantar foot wound. We have been using Hydrofera Blue with antibiotic ointment under the total contact cast to the left lower extremity wound. She has no issues or complaints today. 3/27; patient presents for follow-up. We have been using antibiotic ointment and Aquacel Ag to the right plantar foot wound. We have been using Hydrofera Blue with antibiotic ointment under the total contact cast to the left lower extremity. Both wounds are smaller. 01-24-2023 upon evaluation today patient actually appears to be doing better compared to last time I saw her. It has been several weeks since I last saw her but nonetheless I think the total contact casting is doing [name] good job. Fortunately I do not see any evidence of infection or worsening overall which is great news and in general I do believe that she is moving in the appropriate direction. It has been [name] very long road but nonetheless I feel like she is nearing the end at least in regard to the left foot and even the right foot looks much better to be perfectly honest. 01-31-2023 upon evaluation today patient appears to be doing well currently in regard to her wounds. She has been require some sharp debridement which I think is probably bone both sides to remove [name] lot of callus that has built up at this point. I discussed that with her today. Also think total contact cast is doing well for the left foot Emina continue that as well. 4/17; patient presents for follow-up. We have been doing Hydrofera Blue  with antibiotic ointment under the total contact cast to the left lower extremity and Aquacel Ag with antibiotic ointment to the right foot wound. She has been using her offloading heel shoe here. 4/24; patient presents for follow-up. We have been doing Hydrofera Blue with antibiotic ointment under the total contact cast to the left lower extremity and Aquacel Ag with antibiotic ointment to the right foot wound. She has been using her offloading heel shoe here. Wounds are smaller with slough accumulation. 5/1; patient presents for follow-up. We have been doing Hydrofera Blue with antibiotic ointment under the total contact cast to the left  lower extremity and Aquacel Ag with antibiotic ointment to the right foot wound. Left foot wound is stable and right foot wound appears smaller. 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 On the left she is using Hydrofera Blue topical antibiotic under [name] total contact cast, on the right plantar heel gentamicin Aquacel Ag. According to our intake nurse both are making slow but steady improvements. 5/15; patient presents for follow-up. We have been doing Hydrofera Blue with antibiotic ointment under the total contact cast on the left and antibiotic ointment with Aquacel Ag on the right. Unfortunately the left foot wound has declined in size and appearance. We also do not have the total contact cast available in office today. Patient denies signs of infection. 5/22; patient presents for follow-up. We have been doing Hydrofera Blue and antibiotic ointment to the left foot and Aquacel Ag with antibiotic ointment to the right foot. We took [name] break from the cast since we did not have it placed at last week. Wounds look stable if not slightly improved. 03-27-2023 patient presents today she has had this For Most 2 Weeks Because She Was in the Hospital and They Did Not T It off. Again That Is Really Not ake Something That She Could Control but Nonetheless We Are Seeing Her at  This Point Today for Reevaluation. We Do Want to Go and See about Reapply the Cast. Again We Had All Set Everything up to Go Ahead and Do This When We Went to Get the Cast Only to Realize That Not Had Come in and We Were out at the Moment. With That Being [place] Spaniel We Will Get Have T Get the Exam before We Can Reapply the T Contact Casting Going Forward. o otal 6/12; patient presents for follow-up. She has been using Hydrofera Blue to the left foot wound and Aquacel Ag to the right foot wound. No cast was available last week placed on the patient so she has been offloading with her surgical shoe. She has no issues or complaints today. 6/19; patient presents for follow-up. We have been using Hydrofera Blue to the left foot wound under the total contact cast and Aquacel Ag to the right wound. She has no issues or complaints today. The wounds are smaller. [name], [name] ([id number]) [id number] Page 19 of 23 6/26; patient presents for follow-up. We have been using Hydrofera Blue to the left foot wound under the total contact cast and Aquacel Ag to the right foot wound. Wounds are smaller. Patient has no issues or complaints today. 7/3; patient presents for follow-up. We have been using Hydrofera Blue to the left foot wound under the total contact cast and Aquacel Ag to the right foot wound. Wounds Appear smaller. No signs of infection. 7/10; patient presents for follow-up. We have been using Hydrofera Blue to the left foot wound under the total contact cast and Aquacel Ag to the right foot wound. Left foot wound is smaller. Right foot wound is stable. 7/17; patient presents for follow-up. We have been using Hydrofera Blue to the left foot under the total contact cast and Aquacel Ag to the right foot wound. She has no issues or complaints today. Patient History Information obtained from Patient. Social History Current every day smoker, Marital Status - Single, Alcohol Use -  Never, Drug Use - Prior History, Caffeine Use - Daily. Medical History Ear/Nose/Mouth/Throat Patient has history of Chronic sinus problems/congestion, Middle ear problems Hematologic/Lymphatic Patient has history of Anemia Respiratory Patient has history of Chronic Obstructive  Pulmonary Disease (COPD) Cardiovascular Patient has history of Congestive Heart Failure - EF 33% Endocrine Patient has history of Type II Diabetes Genitourinary Patient has history of End Stage Renal Disease Integumentary (Skin) Patient has history of History of pressure wounds Neurologic Patient has history of Neuropathy - feel and legs Objective Constitutional Vitals Time Taken: 11:08 AM, Height: 69 in, Weight: 178 lbs, BMI: 26.3, Temperature: 98.3 F, Pulse: 80 bpm, Respiratory Rate: 18 breaths/min, Blood Pressure: 122/68 mmHg. General Notes: Right foot: T the plantar heel there is an incision site with increased depth, Granulation tissue and slough at the opening. Left foot: T the o o medial aspect there is an open wound with granulation tissue, Callus and slough build up. No overt signs of infection to any of the wound beds. Integumentary (Hair, Skin) Wound #11 status is Open. Original cause of wound was Surgical Injury. The date acquired was: 12/01/2021. The wound has been in treatment 60 weeks. The wound is located on the Right,Plantar Foot. The wound measures 0.1cm length x 0.1cm width x 0.3cm depth; 0.008cm^2 area and 0.002cm^3 volume. There is Fat Layer (Subcutaneous Tissue) exposed. There is no tunneling or undermining noted. There is [name] small amount of serous drainage noted. The wound margin is distinct with the outline attached to the wound base. There is small (1-33%) pink, pale granulation within the wound bed. There is [name] large (67-100%) amount of necrotic tissue within the wound bed including Adherent Slough. Wound #12 status is Open. Original cause of wound was Pressure Injury. The date acquired was:  03/16/2020. The wound has been in treatment 60 weeks. The wound is located on the Left,Medial,Plantar Foot. The wound measures 0.2cm length x 0.2cm width x 0.2cm depth; 0.031cm^2 area and 0.006cm^3 volume. There is Fat Layer (Subcutaneous Tissue) exposed. There is no tunneling or undermining noted. There is [name] medium amount of serosanguineous drainage noted. The wound margin is flat and intact. There is large (67-100%) pink, pale granulation within the wound bed. There is [name] small (1-33%) amount of necrotic tissue within the wound bed including Adherent Slough. 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 I debrided nonviable tissue. I recommended continue Hydrofera Blue and the total contact cast here. Continue Aquacel Ag to the right plantar heel wound also with offloading shoe. Follow-up in 1 week. Procedures Wound #11 Pre-procedure diagnosis of Wound #11 is an Open Surgical Wound located on the Right,Plantar Foot . There was [name] Excisional Skin/Subcutaneous Tissue Debridement with [name] total area of 0.01 sq cm performed by Heather Corwin, MD. With the following instrument(s): Curette to remove Viable and Non-Viable tissue/material. Material removed includes Callus, Subcutaneous Tissue, Slough, Skin: Dermis, and Skin: Epidermis after achieving pain control using Lidocaine 4% T opical Solution. No specimens were taken. [name] time out was conducted at 11:31, prior to the start of the procedure. [name] Moderate amount of  bleeding was controlled with Pressure. The procedure was tolerated well. Post Debridement Measurements: 0.1cm length x 0.1cm width x 0.3cm depth; 0.002cm^3 volume. Character of Wound/Ulcer Post Debridement is stable. Post procedure Diagnosis Wound #11: Same as Pre-Procedure  Wound #12 Pre-procedure diagnosis of Wound #12 is [name] Diabetic Wound/Ulcer of the Lower Extremity located on the Left,Medial,Plantar Foot .Severity of Tissue Pre Debridement is: Fat layer exposed. There was [name] Selective/Open Wound Skin/Epidermis Debridement with [name] total area of 0.03 sq cm performed by Heather Corwin, MD. With the following instrument(s): Curette to remove Viable and Non-Viable tissue/material. Material removed includes Callus, Slough, Skin: Dermis, and Skin: Epidermis after achieving pain control using Lidocaine 4% T opical Solution. No specimens were taken. [name] time out was conducted at 11:29, prior to the start of the procedure. [name] Moderate amount of bleeding was controlled with Pressure. The procedure was tolerated well. Post Debridement Measurements: 0.2cm length x 0.2cm width x 0.2cm depth; 0.006cm^3 volume. Character of Wound/Ulcer Post Debridement is stable. Severity of Tissue Post Debridement is: Fat layer exposed. Post procedure Diagnosis Wound #12: Same as Pre-Procedure Pre-procedure diagnosis of Wound #12 is [name] Diabetic Wound/Ulcer of the Lower Extremity located on the Left,Medial,Plantar Foot . There was [name] T Contact otal Cast Procedure by Heather Corwin, MD. Post procedure Diagnosis Wound #12: Same as Pre-Procedure Plan Follow-up Appointments: Return Appointment in 1 week. Bathing/ Shower/ Hygiene: May shower with wound dressing protected with water repellent cover or cast protector. No tub bath. Anesthetic (Use 'Patient Medications' Section for Anesthetic Order Entry): Lidocaine applied to wound bed Off-Loading: T Contact Cast to Left Lower Extremity otal Open toe surgical shoe - right WOUND  #11: - Foot Wound Laterality: Plantar, Right Cleanser: Soap and Water 3 x Per Week/30 Days Discharge Instructions: Gently cleanse wound with antibacterial soap, rinse and pat dry prior to dressing wounds Cleanser: Wound Cleanser 3 x Per Week/30 Days Discharge Instructions: Wash your hands with soap and water. Remove old dressing, discard into plastic bag and place into trash. Cleanse the wound with Wound Cleanser prior to applying [name] clean dressing using gauze sponges, not tissues or cotton balls. Do not scrub or use excessive force. Pat dry using gauze sponges, not tissue or cotton balls. Prim Dressing: Aquacel Extra Hydrofiber Dressing, 2x2 (in/in) 3 x Per Week/30 Days ary Secondary Dressing: ABD Pad 5x9 (in/in) 3 x Per Week/30 Days Discharge Instructions: Cover with ABD pad Secondary Dressing: 3 x Per Week/30 Days Secured With: Medipore T - 36M Medipore H Soft Cloth Surgical T ape ape, 2x2 (in/yd) 3 x Per Week/30 Days Secured With: [employer] or Non-Sterile 6-ply 4.5x4 (yd/yd) 3 x Per Week/30 Days Discharge Instructions: Apply Kerlix as directed WOUND #12: - Foot Wound Laterality: Plantar, Left, Medial Cleanser: Soap and Water 1 x Per Week/30 Days Discharge Instructions: Gently cleanse wound with antibacterial soap, rinse and pat dry prior to dressing wounds Cleanser: Wound Cleanser 1 x Per Week/30 Days Discharge Instructions: Wash your hands with soap and water. Remove old dressing, discard into plastic bag and place into trash. Cleanse the wound with Wound Cleanser prior to applying [name] clean dressing using gauze sponges, not tissues or cotton balls. Do not scrub or use excessive force. Pat dry using gauze sponges, not tissue or cotton balls. Prim Dressing: Hydrofera Blue Ready Transfer Foam, 2.5x2.5 (in/in) 1 x Per Week/30 Days ary Discharge Instructions: Apply Hydrofera Blue Ready to wound bed as directed Secondary Dressing: Gauze 1 x Per Week/30 Days Discharge Instructions: As  directed: dry Secondary Dressing: heel cup 1 x Per Week/30 Days Secured With: Medipore T - 36M Medipore H Soft Cloth Surgical T ape ape, 2x2 (in/yd) 1 x Per Week/30 Days Secured With: [employer] or Non-Sterile 6-ply  4.5x4 (yd/yd) 1 x Per Week/30 Days Discharge Instructions: Apply Kerlix as directed 1. In office sharp debridement 2. Hydrofera Blue under the total contact castleft lower extremity 3. Aquacel Ag to the right plantar foot wound 4. Aggressive offloading 5. Follow-up in 1 week [name], [name] ([id number]) [id number] Page 21 of 23 Electronic Signature(s) Signed: 05/10/2023 1:39:39 PM By: Heather Corwin DO Entered By: [name] on 05/10/2023 13:39:22 -------------------------------------------------------------------------------- ROS/PFSH Details Patient Name: Date of Service: [name], Sherri [name]. 05/10/2023 11:00 [name]: [id number] Patient Account Number: [id number] Date of Birth/Sex: Treating RN: 01/30/1976 (47 y.o. [name] Primary Care Provider: [name] Other Clinician: Referring Provider: Treating Provider/Extender: Chapman Moss in Treatment: 54 Information Obtained From Patient Ear/Nose/Mouth/Throat Medical History: Positive for: Chronic sinus problems/congestion; Middle ear problems Hematologic/Lymphatic Medical History: Positive for: Anemia Respiratory Medical History: Positive for: Chronic Obstructive Pulmonary Disease (COPD) Cardiovascular Medical History: Positive for: Congestive Heart Failure - EF 33% Endocrine Medical History: Positive for: Type II Diabetes Time with diabetes: 14 years Treated with: Insulin, Oral agents Blood sugar tested every day: No Blood sugar testing results: Bedtime: 176 Genitourinary Medical History: Positive for: End Stage Renal Disease Integumentary (Skin) Medical History: Positive for: History of pressure  wounds Neurologic Medical History: Positive for: Neuropathy - feel and legs HBO Extended History Items Peake, Anjeli [name] ([id number]) [id number] Page 22 of 23 Ear/Nose/Mouth/Throat: Ear/Nose/Mouth/Throat: Chronic sinus Middle ear problems problems/congestion Immunizations Pneumococcal Vaccine: Received Pneumococcal Vaccination: No Implantable Devices No devices added Family and Social History Current every day smoker; Marital Status - Single; Alcohol Use: Never; Drug Use: Prior History; Caffeine Use: Daily Electronic Signature(s) Signed: 05/10/2023 1:39:39 PM By: Heather Corwin DO Signed: 05/10/2023 4:35:37 PM By: [name] Entered By: [name] on 05/10/2023 11:41:37 -------------------------------------------------------------------------------- Total Contact Cast Details Patient Name: Date of Service: [name] 05/10/2023 11:00 [name]: [id number] Patient Account Number: [id number] Date of Birth/Sex: Treating RN: 1976/10/12 (47 y.o. [name] Primary Care Provider: [name] Other Clinician: Referring Provider: Treating Provider/Extender: Chapman Moss in Treatment: 60 T Contact Cast Applied for Wound Assessment: otal Wound #12 Left,Medial,Plantar Foot Performed By: Physician Heather Corwin, MD Post Procedure Diagnosis Same as Pre-procedure Electronic Signature(s) Signed: 05/10/2023 12:53:17 PM By: [name] Signed: 05/10/2023 1:39:39 PM By: Heather Corwin DO Entered By: [name] on 05/10/2023 12:53:17 -------------------------------------------------------------------------------- SuperBill Details Patient Name: Date of Service: [name] 05/10/2023 Medical Record Number: [id number] Patient Account Number: [id number] Date of Birth/Sex: Treating RN: 1976-05-29 (47 y.o. 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 Z79.4 Long term (current) use of insulin Facility Procedures : CPT4 Code: 53664403 Description: 11042 - DEB SUBQ TISSUE 20 SQ CM/< ICD-10 Diagnosis Description 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i E11.621 Type 2 diabetes mellitus with foot ulcer Modifier: ty Quantity: 1 : CPT4 Code: [id number] Description: 97597 - DEBRIDE WOUND 1ST 20 SQ  CM OR < ICD-10 Diagnosis Description 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.621 Type 2 diabetes mellitus with foot ulcer Modifier: Quantity: 1 Physician Procedures : CPT4 Code Description Modifier [id number] 11042 - WC PHYS SUBQ TISS 20 SQ CM ICD-10 Diagnosis Description 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ity E11.621 Type 2 diabetes mellitus with foot ulcer Quantity: 1 : [id number] 97597 - WC PHYS DEBR WO ANESTH 20 SQ CM ICD-10 Diagnosis Description 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.621 Type 2 diabetes mellitus with foot ulcer Quantity: 1 Electronic Signature(s) Signed: 05/10/2023 12:08:37 PM By: [name] Signed: 05/10/2023 1:39:39 PM By: Heather Corwin DO Entered By: [name] on 05/10/2023 12:08:36

## 2023-05-10 NOTE — Progress Notes (Signed)
Heather, Heather Heather ([id number]) [id number] Page 1 of 10 Visit Report for 05/10/2023 Arrival Information Details Patient Name: Date of Service: Heather, Heather 05/10/2023 11:00 Heather Heather Medical Record Number: [id number] Patient Account Number: [id number] Date of Birth/Sex: Treating RN: 08-04-1976 (47 y.o. Heather Heather Primary Care Heather Heather: Heather Heather Other Clinician: Referring Heather Heather: Treating Heather Heather/Extender: Heather Heather in Treatment: 60 Visit Information History Since Last Visit Added or deleted any medications: No Patient Arrived: Wheel Chair Any new allergies or adverse reactions: No Arrival Time: 11:07 Had Heather fall or experienced change in No Accompanied By: family activities of daily living that may affect Transfer Assistance: None risk of falls: Patient Identification Verified: Yes Hospitalized since last visit: No Secondary Verification Process Completed: Yes Has Dressing in Place as Prescribed: Yes Patient Requires Transmission-Based Precautions: No Has Footwear/Offloading in Place as Prescribed: Yes Patient Has Alerts: No Left: T Contact Cast otal Pain Present Now: No Electronic Signature(s) Signed: 05/10/2023 4:35:37 PM By: Heather Heather Entered By: Heather Heather on 05/10/2023 11:08:16 -------------------------------------------------------------------------------- Clinic Level of Care Assessment Details Patient Name: Date of Service: Heather, Heather 05/10/2023 11:00 Heather Heather Medical Record Number: [id number] Patient Account Number: [id number] Date of Birth/Sex: Treating RN: 1976/06/11 (47 y.o. Heather Heather Primary Care Heather Heather: Heather Heather Other Clinician: Referring Heather Heather: Treating Heather Heather/Extender: Heather Heather in Treatment: 60 Clinic Level of Care Assessment Items TOOL 1 Quantity Score []  - 0 Use when EandM and Procedure is performed on INITIAL  visit ASSESSMENTS - Nursing Assessment / Reassessment []  - 0 General Physical Exam (combine w/ comprehensive assessment (listed just below) when performed on new pt. evals) []  - 0 Comprehensive Assessment (HX, ROS, Risk Assessments, Wounds Hx, etc.) ASSESSMENTS - Wound and Skin Assessment / Reassessment []  - 0 Dermatologic / Skin Assessment (not related to wound area) Heather Heather ([id number]) [id number] Page 2 of 10 ASSESSMENTS - Ostomy and/or Continence Assessment and Care []  - 0 Incontinence Assessment and Management []  - 0 Ostomy Care Assessment and Management (repouching, etc.) PROCESS - Coordination of Care []  - 0 Simple Patient / Family Education for ongoing care []  - 0 Complex (extensive) Patient / Family Education for ongoing care []  - 0 Staff obtains Chiropractor, Records, T Results / Process Orders est []  - 0 Staff telephones HHA, Nursing Homes / Clarify orders / etc []  - 0 Routine Transfer to another Facility (non-emergent condition) []  - 0 Routine Hospital Admission (non-emergent condition) []  - 0 New Admissions / Manufacturing engineer / Ordering NPWT Apligraf, etc. , []  - 0 Emergency Hospital Admission (emergent condition) PROCESS - Special Needs []  - 0 Pediatric / Minor Patient Management []  - 0 Isolation Patient Management []  - 0 Hearing / Language / Visual special needs []  - 0 Assessment of Community assistance (transportation, D/C planning, etc.) []  - 0 Additional assistance / Altered mentation []  - 0 Support Surface(s) Assessment (bed, cushion, seat, etc.) INTERVENTIONS - Miscellaneous []  - 0 External ear exam []  - 0 Patient Transfer (multiple staff / Nurse, adult / Similar devices) []  - 0 Simple Staple / Suture removal (25 or less) []  - 0 Complex Staple / Suture removal (26 or more) []  - 0 Hypo/Hyperglycemic Management (do not check if billed separately) []  - 0 Ankle / Brachial Index (ABI) - do not check if  billed separately Has the patient been seen at the hospital within the last three years: Yes Total Score: 0 Level Of Care: ____ Electronic Signature(s) Signed: 05/10/2023 4:35:37  PM By: Heather Heather Entered By: Heather Heather on 05/10/2023 12:08:28 -------------------------------------------------------------------------------- Encounter Discharge Information Details Patient Name: Date of Service: Heather Jefferson Heather. 05/10/2023 11:00 Heather Heather Medical Record Number: [id number] Patient Account Number: [id number] Date of Birth/Sex: Treating RN: 1976-07-05 (47 y.o. Heather Heather Primary Care Heather Heather: Heather Heather Other Clinician: Referring Heather Heather: Treating Heather Heather/Extender: Heather Heather in Treatment: [phone] Encounter Discharge Information Items Post Procedure Vitals Heather Heather ([id number]) [id number] Page 3 of 10 Discharge Condition: Stable Temperature (F): 98.3 Ambulatory Status: Wheelchair Pulse (bpm): 80 Discharge Destination: Home Respiratory Rate (breaths/min): 18 Transportation: Private Auto Blood Pressure (mmHg): 122/68 Accompanied By: family Schedule Follow-up Appointment: Yes Clinical Summary of Care: Electronic Signature(s) Signed: 05/10/2023 12:10:20 PM By: Heather Heather Entered By: Heather Heather on 05/10/2023 12:10:20 -------------------------------------------------------------------------------- Lower Extremity Assessment Details Patient Name: Date of Service: Heather Heather 05/10/2023 11:00 Heather Heather Medical Record Number: [id number] Patient Account Number: [id number] Date of Birth/Sex: Treating RN: [birth year] (47 y.o. Heather Heather Primary Care Hollie Bartus: Heather Heather Other Clinician: Referring Zabian Swayne: Treating Jibran Crookshanks/Extender: Heather Heather Weeks in Treatment: 60 Edema Assessment Assessed: [Left: No] [Right: No] [Left: Edema] [Right: :] Calf Left: Right: Point of  Measurement: 31 cm From Medial Instep 28.5 cm Ankle Left: Right: Point of Measurement: 10 cm From Medial Instep 17.5 cm Vascular Assessment Pulses: Dorsalis Pedis Palpable: [Left:Yes] [Right:Yes] Electronic Signature(s) Signed: 05/10/2023 4:35:37 PM By: Heather Heather Entered By: Heather Heather on 05/10/2023 11:24:13 Earmon Phoenix Heather ([id number]) [id number] Page 4 of 10 -------------------------------------------------------------------------------- Multi Wound Chart Details Patient Name: Date of Service: Heather, Heather 05/10/2023 11:00 Heather Heather Medical Record Number: [id number] Patient Account Number: [id number] Date of Birth/Sex: Treating RN: 1976/09/14 (47 y.o. 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: Extremity Chronic sinus problems/congestion, Chronic sinus problems/congestion, N/Heather Comorbid History: Middle ear problems, Anemia, Chronic Middle ear problems, Anemia, Chronic Obstructive Pulmonary Disease Obstructive Pulmonary Disease (COPD), Congestive Heart Failure, (COPD), Congestive Heart Failure, Type II Diabetes, End Stage Renal Type II Diabetes, End Stage Renal Disease, History of pressure wounds, Disease, History of pressure wounds, Neuropathy Neuropathy 12/01/2021 03/16/2020 N/Heather Date Acquired: 50 60 N/Heather Weeks of Treatment: Open Open N/Heather Wound Status: No No N/Heather Wound Recurrence: 0.1x0.1x0.3 0.2x0.2x0.2  N/Heather Measurements L x W x D (cm) 0.008 0.031 N/Heather Heather (cm) : rea 0.002 0.006 N/Heather Volume (cm) : 99.60% 99.30% N/Heather % Reduction in Area: 99.90% 99.40% N/Heather % Reduction in Volume: Full Thickness Without Exposed Grade 3 N/Heather Classification: Support Structures Small Medium N/Heather Exudate Amount: Serous Serosanguineous N/Heather Exudate Type: amber red, brown N/Heather Exudate Color: Distinct, outline attached Flat and Intact N/Heather Wound Margin: Small (1-33%) Large (67-100%) N/Heather Granulation Amount: Pink, Pale Pink, Pale N/Heather Granulation Quality: Large (67-100%) Small (1-33%) N/Heather Necrotic Amount: Fat Layer (Subcutaneous Tissue): Yes Fat Layer (Subcutaneous Tissue): Yes N/Heather Exposed Structures: Fascia: No Fascia: No Tendon: No Tendon: No Muscle: No Muscle: No Joint: No Joint: No Bone: No Bone: No Medium (34-66%) None N/Heather Epithelialization: Treatment Notes Electronic Signature(s) Signed: 05/10/2023 4:35:37 PM By: Heather Heather Entered By: Heather Heather on 05/10/2023 11:25:21 Earmon Phoenix  Heather ([id number]) [id number] Page 5 of 10 -------------------------------------------------------------------------------- Multi-Disciplinary Care Plan Details Patient Name: Date of Service: Heather, Heather 05/10/2023 11:00 Heather Heather Medical Record Number: [id number] Patient Account Number: [id number] Date of Birth/Sex: Treating RN: 1976/02/26 (47 y.o. Heather Heather Primary Care Shahana Capes: Heather Heather Other Clinician: Referring Chanique Duca: Treating Merdith Boyd/Extender: Heather Heather in Treatment: 37 Active Inactive Abuse / Safety / Falls / Self Care Management Nursing Diagnoses: History of Falls Potential for falls Potential for injury related to falls Goals: Patient will not develop complications from immobility Date Initiated: 03/17/2022 Date Inactivated: 04/20/2022 Target Resolution Date: 03/16/2022 Goal Status: Met Patient/caregiver will verbalize  understanding of skin care regimen Date Initiated: 03/17/2022 Date Inactivated: 09/28/2022 Target Resolution Date: 03/16/2022 Goal Status: Met Patient/caregiver will verbalize/demonstrate measure taken to improve self care Date Initiated: 03/17/2022 Target Resolution Date: 05/24/2023 Goal Status: Active Interventions: Assess fall risk on admission and as needed Provide education on basic hygiene Provide education on personal and home safety Notes: Osteomyelitis Nursing Diagnoses: Infection: osteomyelitis Knowledge deficit related to disease process and management Goals: Patient/caregiver will verbalize understanding of disease process and disease management Date Initiated: 03/17/2022 Date Inactivated: 03/27/2023 Target Resolution Date: 03/16/2022 Goal Status: Met Patient's osteomyelitis will resolve Date Initiated: 03/17/2022 Target Resolution Date: 10/26/2022 Goal Status: Active Signs and symptoms for osteomyelitis will be recognized and promptly addressed Date Initiated: 03/17/2022 Target Resolution Date: 10/26/2022 Goal Status: Active Interventions: Assess for signs and symptoms of osteomyelitis resolution every visit Provide education on osteomyelitis Treatment Activities: Systemic antibiotics : 03/16/2022 Notes: Patient on antibiotics. ISMAHAN, LIPPMAN Heather ([id number]) [id number] Page 6 of 10 Electronic Signature(s) Signed: 05/10/2023 12:09:10 PM By: Heather Heather Entered By: Heather Heather on 05/10/2023 12:09:09 -------------------------------------------------------------------------------- Pain Assessment Details Patient Name: Date of Service: TENAYA, HILYER 05/10/2023 11:00 Heather Heather Medical Record Number: [id number] Patient Account Number: [id number] Date of Birth/Sex: Treating RN: 11/08/1975 (47 y.o. Heather Heather Primary Care Maely Clements: Heather Heather Other Clinician: Referring Ashaun Gaughan: Treating Bartosz Luginbill/Extender: Heather Heather in Treatment: 48 Active Problems Location of Pain Severity and Description of Pain Patient Has Paino No Site Locations Rate the pain. Current Pain Level: 0 Pain Management and Medication Current Pain Management: Electronic Signature(s) Signed: 05/10/2023 4:35:37 PM By: Heather Heather Entered By: Heather Heather on 05/10/2023 11:08:52 -------------------------------------------------------------------------------- Patient/Caregiver Education Details Patient Name: Date of Service: Heather Heather 7/17/2024andnbsp11:00 Heather Heather ([id number]) [id number] Page 7 of 10 Medical Record Number: [id number] Patient Account Number: [id number] Date of Birth/Gender: Treating RN: 1976-05-29 (47 y.o. Heather Heather Primary Care Physician: Heather Heather Other Clinician: Referring Physician: Treating Physician/Extender: Heather Heather in Treatment: 65 Education Assessment Education Provided To: Patient Education Topics Provided Wound Debridement: Handouts: Wound Debridement Methods: Explain/Verbal Responses: State content correctly Wound/Skin Impairment: Handouts: Caring for Your Ulcer Methods: Explain/Verbal Responses: State content correctly Electronic Signature(s) Signed: 05/10/2023 4:35:37 PM By: Heather Heather Entered By: Heather Heather on 05/10/2023 12:09:26 -------------------------------------------------------------------------------- Wound Assessment Details Patient Name: Date of Service: Heather Jefferson Heather. 05/10/2023 11:00 Heather Heather Medical Record Number: [id number] Patient Account Number: [id number] Date of Birth/Sex: Treating RN: 02/27/1976 (47 y.o. Heather Heather Primary Care Emmagrace Runkel: Heather Heather Other Clinician: Referring Kwinton Maahs: Treating Alesandro Stueve/Extender: Heather Heather Weeks in Treatment: 60 Wound Status Wound Number: 11 Primary Open Surgical Wound Etiology: Wound  Location: Right, Plantar Foot Wound Open Wounding Event: Surgical Injury Status: Date Acquired: 12/01/2021 Comorbid Chronic sinus problems/congestion, Middle ear problems, Anemia, Weeks Of Treatment:  60 History: Chronic Obstructive Pulmonary Disease (COPD), Congestive Heart Clustered Wound: No Failure, Type II Diabetes, End Stage Renal Disease, History of pressure wounds, Neuropathy Photos Heather, Heather Heather ([id number]) [id number] Page 8 of 10 Wound Measurements Length: (cm) 0.1 Width: (cm) 0.1 Depth: (cm) 0.3 Area: (cm) 0.008 Volume: (cm) 0.002 % Reduction in Area: 99.6% % Reduction in Volume: 99.9% Epithelialization: Medium (34-66%) Tunneling: No Undermining: No Wound Description Classification: Full Thickness Without Exposed Suppo Wound Margin: Distinct, outline attached Exudate Amount: Small Exudate Type: Serous Exudate Color: amber rt Structures Foul Odor After Cleansing: No Slough/Fibrino Yes Wound Bed Granulation Amount: Small (1-33%) Exposed Structure Granulation Quality: Pink, Pale Fascia Exposed: No Necrotic Amount: Large (67-100%) Fat Layer (Subcutaneous Tissue) Exposed: Yes Necrotic Quality: Adherent Slough Tendon Exposed: No Muscle Exposed: No Joint Exposed: No Bone Exposed: No Treatment Notes Wound #11 (Foot) Wound Laterality: Plantar, Right Cleanser Soap and Water Discharge Instruction: Gently cleanse wound with antibacterial soap, rinse and pat dry prior to dressing wounds Wound Cleanser Discharge Instruction: Wash your hands with soap and water. Remove old dressing, discard into plastic bag and place into trash. Cleanse the wound with Wound Cleanser prior to applying Heather clean dressing using gauze sponges, not tissues or cotton balls. Do not scrub or use excessive force. Pat dry using gauze sponges, not tissue or cotton balls. Heather-Wound Care Topical Primary Dressing Aquacel Extra Hydrofiber Dressing, 2x2 (in/in) Secondary  Dressing ABD Pad 5x9 (in/in) Discharge Instruction: Cover with ABD pad Secured With Medipore T - 67M Medipore H Soft Cloth Surgical T ape ape, 2x2 (in/yd) Kerlix Roll Sterile or Non-Sterile 6-ply 4.5x4 (yd/yd) Discharge Instruction: Apply Kerlix as directed Compression Wrap Compression Stockings Add-Ons Electronic Signature(s) Signed: 05/10/2023 4:35:37 PM By: Heather Heather Entered By: Heather Heather on 05/10/2023 11:22:26 Earmon Phoenix Heather ([id number]) [id number] Page 9 of 10 -------------------------------------------------------------------------------- Wound Assessment Details Patient Name: Date of Service: Heather, Heather 05/10/2023 11:00 Heather Heather Medical Record Number: [id number] Patient Account Number: [id number] Date of Birth/Sex: Treating RN: 05/27/1976 (47 y.o. Heather Heather Primary Care Sevastian Witczak: Heather Heather Other Clinician: Referring Jetaun Colbath: Treating Anihya Tuma/Extender: Heather Heather Weeks in Treatment: 60 Wound Status Wound Number: 12 Primary Diabetic Wound/Ulcer of the Lower Extremity Etiology: Wound Location: Left, Medial, Plantar Foot Wound Open Wounding Event: Pressure Injury Status: Date Acquired: 03/16/2020 Comorbid Chronic sinus problems/congestion, Middle ear problems, Anemia, Weeks Of Treatment: 60 History: Chronic Obstructive Pulmonary Disease (COPD), Congestive Heart Clustered Wound: No Failure, Type II Diabetes, End Stage Renal Disease, History of pressure wounds, Neuropathy Photos Wound Measurements Length: (cm) 0.2 Width: (cm) 0.2 Depth: (cm) 0.2 Area: (cm) 0.031 Volume: (cm) 0.006 % Reduction in Area: 99.3% % Reduction in Volume: 99.4% Epithelialization: None Tunneling: No Undermining: No Wound Description Classification: Grade 3 Wound Margin: Flat and Intact Exudate Amount: Medium Exudate Type: Serosanguineous Exudate Color: red, brown Foul Odor After Cleansing: No Slough/Fibrino  No Wound Bed Granulation Amount: Large (67-100%) Exposed Structure Granulation Quality: Pink, Pale Fascia Exposed: No Necrotic Amount: Small (1-33%) Fat Layer (Subcutaneous Tissue) Exposed: Yes Necrotic Quality: Adherent Slough Tendon Exposed: No Muscle Exposed: No Joint Exposed: No Bone Exposed: No Treatment Notes Wound #12 (Foot) Wound Laterality: Plantar, Left, Medial Cleanser Soap and Water Discharge Instruction: Gently cleanse wound with antibacterial soap, rinse and pat dry prior to dressing wounds Wound Cleanser Discharge Instruction: Wash your hands with soap and water. Remove old dressing, discard into plastic bag and place into trash. Cleanse the wound with Wound Cleanser prior to applying Heather clean dressing  using gauze sponges, not tissues or cotton balls. Do not scrub or use excessive force. Pat dry using gauze sponges, not tissue or cotton balls. Heather-Wound Care Topical Primary Dressing Hydrofera Blue Ready Transfer Foam, 2.5x2.5 (in/in) Discharge Instruction: Apply Hydrofera Blue Ready to wound bed as directed Heather, FARREY Heather ([id number]) [id number] Page 10 of 10 Secondary Dressing Gauze Discharge Instruction: As directed: dry heel cup Secured With Medipore T - 19M Medipore H Soft Cloth Surgical T ape ape, 2x2 (in/yd) Kerlix Roll Sterile or Non-Sterile 6-ply 4.5x4 (yd/yd) Discharge Instruction: Apply Kerlix as directed Compression Wrap Compression Stockings Add-Ons Electronic Signature(s) Signed: 05/10/2023 4:35:37 PM By: Heather Heather Entered By: Heather Heather on 05/10/2023 11:22:55 -------------------------------------------------------------------------------- Vitals Details Patient Name: Date of Service: Heather Heather, Heather Heather. 05/10/2023 11:00 Heather Heather Medical Record Number: [id number] Patient Account Number: [id number] Date of Birth/Sex: Treating RN: 1976/02/26 (47 y.o. Heather Heather Primary Care Devory Mckinzie: Heather Heather Other  Clinician: Referring Toron Bowring: Treating Nikai Quest/Extender: Heather Heather in Treatment: 60 Vital Signs Time Taken: 11:08 Temperature (F): 98.3 Height (in): 69 Pulse (bpm): 80 Weight (lbs): 178 Respiratory Rate (breaths/min): 18 Body Mass Index (BMI): 26.3 Blood Pressure (mmHg): 122/68 Reference Range: 80 - 120 mg / dl Electronic Signature(s) Signed: 05/10/2023 4:35:37 PM By: Heather Heather Entered By: Heather Heather on 05/10/2023 11:08:45

## 2023-05-16 NOTE — Progress Notes (Signed)
Heather, COMES [name] ([id number]) [id number] Page 1 of 22 Visit Report for 05/03/2023 Chief Complaint Document Details Patient Name: Date of Service: [name], [name] 05/03/2023 11:00 [name] Medical Record Number: [id number] Patient Account Number: [id number] Date of Birth/Sex: Treating RN: 10-06-1976 (47 y.o. [name] Primary Care Provider: Lindwood [name] Other Clinician: Betha [name] Referring Provider: Treating Provider/Extender: [name] in Treatment: 8 Information Obtained from: Patient Chief Complaint 03/19/2021; patient referred by Dr. Excell [name] who has been looking after her left foot for quite [name] period of time for review of [name] nonhealing area in the left midfoot 03/12/2022; bilateral feet wounds and right lower extremity wound. Electronic Signature(s) Signed: 05/03/2023 12:00:03 PM By: Heather Corwin DO Entered By: [name] on 05/03/2023 11:56:21 -------------------------------------------------------------------------------- Debridement Details Patient Name: Date of Service: [name] 05/03/2023 11:00 [name] Medical Record Number: [id number] Patient Account Number: [id number] Date of Birth/Sex: Treating RN: 11/26/1975 (47 y.o. Cathlean Cower, [name] Primary Care Provider: Lindwood [name] Other Clinician: Betha [name] Referring Provider: Treating Provider/Extender: [name] in Treatment: 59 Debridement Performed for Assessment: Wound #12 Left,Medial,Plantar Foot Performed By: Physician Heather Corwin, MD Debridement Type: Debridement Severity of Tissue Pre Debridement: Fat layer exposed Level of Consciousness (Pre-procedure): Awake and Alert Pre-procedure Verification/Time Out Yes - 11:38 Taken: Start Time: 11:38 Percent of Wound Bed Debrided: 100% T Area Debrided (cm): otal 0.07 Tissue and other material debrided: Viable, Non-Viable, Callus, Slough, Slough Level: Non-Viable  Tissue Debridement Description: Selective/Open Wound Instrument: Curette Bleeding: Minimum Hemostasis Achieved: Pressure Response to Treatment: Procedure was tolerated well Level of Consciousness (Post- Awake and Alert [name], [name] ([id number]) [id number] Page 2 of 22 Awake and Alert procedure): Post Debridement Measurements of Total Wound Length: (cm) 0.3 Width: (cm) 0.3 Depth: (cm) 0.1 Volume: (cm) 0.007 Character of Wound/Ulcer Post Debridement: Improved Severity of Tissue Post Debridement: Fat layer exposed Post Procedure Diagnosis Same as Pre-procedure Electronic Signature(s) Signed: 05/03/2023 12:00:03 PM By: Heather Corwin DO Signed: 05/08/2023 4:35:54 PM By: [name] Signed: 05/16/2023 8:06:11 AM By: [name], BSN, RN, CWS, [name] RN, BSN Entered By: [name] on 05/03/2023 11:39:04 -------------------------------------------------------------------------------- HPI Details Patient Name: Date of Service: [name], [name] 05/03/2023 11:00 [name] Medical Record Number: [id number] Patient Account Number: [id number] Date of Birth/Sex: Treating RN: 01-26-1976 (47 y.o. [name] Primary Care Provider: Lindwood [name] Other Clinician: Betha [name] Referring Provider: Treating Provider/Extender: [name] in Treatment: 9 History of Present Illness HPI Description: 01/18/18-She is here for initial evaluation of the left great toe ulcer. She is [name] poor historian in regards to timeframe in detail. She states approximately 4 weeks ago she lacerated her toe on something in the house. She followed up with her primary care who placed her on Bactrim and ultimately [name] second dose of Bactrim prior to coming to wound clinic. She states she has been treating the toe with peroxide, Betadine and [name] Band-Aid. She did not check her blood sugar this morning but checked it yesterday morning it was 327; she is unaware of [name] recent A1c  and there are no current records. She saw Dr. she would've orthopedics last week for an old injury to the left ankle, she states he did not see her toe, nor did she bring it to his attention. She smokes approximately 1 pack cigarettes [name] day. Her social situation is concerning, she arrives this morning with her mother who appears extremely intoxicated/under the influence; her mother was asked  to leave the room and be monitored by the patient's grandmother. The patient's aunt then accompanied the patient and the room throughout the rest of the appointment. We had [name] lengthy discussion regarding the deleterious effects of uncontrolled hyperglycemia and smoking as it relates to wound healing and overall health. She was strongly encouraged to decrease her smoking and get her diabetes under better control. She states she is currently on [name] diet and has cut down her [facility] consumption. The left toe is erythematous, macerated and slightly edematous with malodor present. The edema in her left foot is below her baseline, there is no erythema streaking. We will treat her with Santyl, doxycycline; we have ordered and xray, culture and provided [name] Peg assist surgical shoe and cultured the wound. 01/25/18-She is here in follow-up evaluation for [name] left great toe ulcer and presents with an abscess to her suprapubic area. She states her blood sugars remain elevated, feeling "sick" and if levels are below 250, but she is trying. She has made no attempt to decrease her smoking stating that we "can't take away her food in her cigarettes". She has been compliant with offloading using the PEG assist you. She is using Santyl daily. the culture obtained last week grew staph aureus and Enterococcus faecalis; continues on the doxycycline and Augmentin was added on Monday. The suprapubic area has erythema, no femoral variation, purple discoloration, minimal induration, was accessed with [name] cotton tip applicator with  sanguinopurulent drainage, this was cultured, I suspect the current antibiotic treatment will cover and we will not add anything to her current treatment plan. She was advised to go to urgent care or ER with any change in redness, induration or fever. 02/01/18-She is here in follow-up evaluation for left great toe ulcers and [name] new abdominal abscess from last week. She was able to use packing until earlier this week, where she "forgot it was there". She states she was feeling ill with GI symptoms last week and was not taking her antibiotic. She states her glucose levels have been predominantly less than 200, with occasional levels between 200-250. She thinks this was contributing to her GI symptoms as they have resolved without intervention. There continues to be significant laceration to left toe, otherwise it clinically looks stable/improved. There is now less superficial opening to the lateral aspect of the great toe that was residual blister. We will transition to [facility] to all wounds, she will continue her Augmentin. If there is no change or deterioration next week for reculture. 02/08/18-She is here in follow-up evaluation for left great toe ulcer and abdominal ulcer. There is an improvement in both wounds. She has been wrapping her left toe with coban, not by our direction, which has created an area of discoloration to the medial aspect; she has been advised to NOT use coban secondary to her neuropathy. She states her glucose levels have been high over this last week ranging from 200-350, she continues to smoke. She admits to being less compliant with her offloading shoe. We will continue with same treatment plan and she will follow-up next week. 02/15/18-She is here in follow-up evaluation for left great toe ulcer and abdominal ulcer. The abdominal ulcer is epithelialized. The left great toe ulcer is improved and all injury from last week using the Coban wrap is resolved, the lateral ulcer  is healed. She admits to noncompliance with wearing offloading shoe and admits to glucose levels being greater than 300 most of the week. She continues to smoke  and expresses no desire to quit. There is one area medially that probes deeper than it has historically, erythema to the toe and dorsal foot has consistently waxed and waned. There is no overt signs of cellulitis or infection but we will culture the wound for any occult infection given the new area of depth and erythema. We will hold off on sensitivities for initiation of antibiotic therapy. 02/22/18-She is here in follow up evaluation for left great toe ulcer. There is overall significant improvement in both wound appearance, erythema and edema with [name], [name] [name] ([id number]) [id number] Page 3 of 22 changes made last week. She was not initiated on antibiotic therapy. Culture obtained last week showed oxacillin sensitive staph aureus, sensitive to clindamycin. Clindamycin has been called into the pharmacy but she has been instructed to hold off on initiation secondary to overall clinical improvement and her history of antibiotic intolerance. She has been instructed to contact the clinic with any noted changes/deterioration and the wound, erythema, edema and/or pain. She will follow-up next week. She continues to smoke and her glucose levels remain elevated >250; she admits to compliance with offloading shoe 03/01/18 on evaluation today patient appears to be doing fairly well in regard to her left first toe ulcer. She has been tolerating the dressing changes with the [facility] Dressing without complication and overall this has definitely showed signs of improvement according to records as well is what the patient tells me today. I'[name] very pleased in that regard. She is having no pain today 03/08/18 She is here for follow up evaluation of [name] left great toe ulcer. She remains non-compliant with glucose control and  smoking cessation; glucose levels consistently >200. She states that she got new shoe inserts/peg assist. She admits to compliance with offloading. Since my last evaluation there is significant improvement. We will switch to prisma at this time and she will follow up next week. She is noted to be tachycardic at this appointment, heart rate 120s; she has [name] history of heart rate 70-130 according to our records. She admits to extreme agitation r/t personal issues; she was advised to monitor her heartrate and contact her physician if it does not return to [name] more normal range (<100). She takes cardizem twice daily. 03/15/18-She is here in follow-up evaluation for left great toe ulcer. She remains noncompliant with glucose control and smoking cessation. She admits to compliance with wearing offloading shoe. The ulcer is improved/stable and we will continue with the same treatment plan and she will follow-up next week 03/22/18-She is here for evaluation for left great toe ulcer. There continues to be significant improvement despite recurrent hyperglycemia (over 500 yesterday) and she continues to smoke. She has been compliant with offloading and we will continue with same treatment plan and she will follow-up next week. 03/29/18-She is here for evaluation for left great toe ulcer. Despite continuing to smoke and uncontrolled diabetes she continues to improve. She is compliant with offloading shoe. We will continue with the same treatment plan and she will follow-up next week 04/05/18- She is here in follow up evaluation for [name] left great toe ulcer; she presents with small pustule to left fifth toe (resembles ant bite). She admits to compliance with wearing offloading shoe; continues to smoke or have uncontrolled blood glucose control. There is more callus than usual with evidence of bleeding; she denies known trauma. 04/12/18-She is here for evaluation of left great toe ulcer. Despite noncompliance with glycemic  control and smoking she continues to make  improvement. She continues to wear offloading shoe. The pustule, that was identified last week, to the left fifth toe is resolved. She will follow-up in 2 weeks 05/03/18-she is seen in follow-up evaluation for [name] left great toe ulcer. She is compliant with offloading, otherwise noncompliant with glycemic control and smoking. She has plateaued and there is minimal improvement noted. We will transition to [facility], replaced the insert to her surgical shoe and she will follow-up in one week 05/10/18- She is here in follow up evaluation for [name] left great toe ulcer. It appears stable despite measurement change. We will continue with same treatment plan and follow up next week. 05/24/18-She is seen in follow-up evaluation for [name] left great toe ulcer. She remains compliant with offloading, has made significant improvement in her diet, decreasing the amount of sugar/soda. She said her recent A1c was 10.9 which is lower than. She did see [name] diabetic nutritionist/educator yesterday. She continues to smoke. We will continue with the same treatment plan and she'll follow-up next week. 05/31/18- She is seen in follow-up evaluation for left great toe ulcer. She continues to remain compliant with offloading, continues to make improvement in her diet, increasing her water and decreasing the amount of sugar/soda. She does continue to smoke with no desire to quit. We will apply Prisma to the depth and Hydrofera Blue over. We have not received insurance authorization for oasis. She will follow up next week. 06/07/18-She is seen in follow-up evaluation for left great toe ulcer. It has stalled according to today's measurements although base appears stable. She says she saw [name] diabetic educator yesterday; her average blood sugars are less than 300 which is an improvement for her. She continues to smoke and states "that's my next step" She continues with water over soda. We will order for  xray, culture and reinstate ace wrap compression prior to placing apligraf for next week. She is voicing no complaints or concerns. Her dressing will change to iodoflex over the next week in preparation for apligraf. 06/14/18-She is seen in follow-up evaluation for left great toe ulcer. Plain film x-ray performed last week was negative for osteomyelitis. Wound culture obtained last week grew strep B and OSSA; she is initiated on keflex and cefdinir today; there is erythema to the toe which could be from ace wrap compression, she has [name] history of wrapping too tight and has has been encouraged to maintain ace wraps that we place today. We will hold off on application of apligraf today, will apply next week after antibiotic therapy has been initiated. She admits today that she has resumed taking [name] shower with her foot/toe submerged in water, she has been reminded to keep foot/toe out of the bath water. She will be seen in follow up next week 06/21/18-she is seen in follow-up evaluation for left great toe ulcer. She is tolerating antibiotic therapy with no GI disturbance. The wound is stable. Apligraf was applied today. She has been decreasing her smoking, only had 4 cigarettes yesterday and 1 today. She continues being more compliant in diabetic diet. She will follow-up next week for evaluation of site, if stable will remove at 2 weeks. 06/28/18- She is here in follow up evalution. Apligraf was placed last week, she states the dressing fell off on Tuesday and she was dressing with hydrofera blue. She is healed and will be discharged from the clinic today. She has been instructed to continue with smoking cessation, continue monitoring glucose levels, offloading for an additional 4 weeks and  continue with hydrofera blue for additional two weeks for any possible microscopic opening. Readmission: 08/07/18 on evaluation today patient presents for reevaluation concerning the ulcer of her right great toe. She was  previously discharged on 06/28/18 healed. Nonetheless she states that this began to show signs of drainage she subsequently went to her primary care provider. Subsequently an x-ray was performed on 08/01/18 which was negative. The patient was also placed on antibiotics at that time. Fortunately they should have been effective for the infection. Nonetheless she's been experiencing some improvement but still has [name] lot of drainage coming from the wound itself. 08/14/18 on evaluation today patient's wound actually does show signs of improvement in regard to the erythema at this point. She has completed the antibiotics. With that being said we did discuss the possibility of placing her in [name] total contact cast as of today although I think that I may want to give this just [name] little bit more time to ensure nothing recurrence as far as her infection is concerned. I do not want to put in the cast and risk infection at that time if things are not completely resolved. With that being said she is gonna require some debridement today. 08/21/18 on evaluation today patient actually appears to be doing okay in regard to her toe ulcer. She's been tolerating the dressing changes without complication. With that being said it does appear that she is ready and in fact I think it's appropriate for [place] to go ahead and initiate the total contact cast today. Nonetheless she will require some sharp debridement to prepare the wound for application. Overall I feel like things have been progressing well but we do need to do something to get this to close more readily. 08/24/18 patient seen today for reevaluation after having had the total contact cast applied on Tuesday. She seems to have done very well the wound appears to be doing great and overall I'[name] pleased with the progress that she's made. There were no abnormal areas of rubbing from the cast on her lower extremity. 08/30/18 on evaluation today patient actually appears to be  completely healed in regard to her plantar toe ulcer. She tells me at this point she's been having [name] lot of issues with the cast. She almost fell [name] couple of times the state shall the step of her dog [name] couple times as well. This is been [name] very frustrating process for her other nonetheless she has completely healed the wound which is excellent news. Overall there does not appear to be the evidence of infection at this time which is great news. 09/11/18 evaluation today patient presents for follow-up concerning her great toe ulcer on the left which has unfortunately reopened since I last saw her which was only [name] couple of weeks ago. Unfortunately she was not able to get in to get the shoe and potentially the AFO that's gonna be necessary due to her left foot drop. She continues with offloading shoe but this is not enough to prevent her from reopening it appears. When we last had her in the total contact cast she did well from [name] healing standpoint but unfortunately the wound reopened as soon as she came out of the cast within just [name] couple of weeks. Right now the biggest concern is that I do believe the foot drop is leading to the issue and this is gonna continue to be an issue unfortunately until we get things under control as far as the walking anomaly is  concerned with the foot drop. This is also part of the reason why she falls on [name] regular basis. I just do not believe that is gonna be safe for [place] to reinitiate the total contact cast as last time we had this on she fell 3 times one week which is definitely not normal for her. 09/18/18 upon evaluation today the patient actually appears to be doing about the same in regard to her toe ulcer. She did not contact [facility] as I asked her to even though I had given her the prescription. In fact she actually states that she has no idea where the prescription is. She did apparently call [employer] and they told her that all she needed to do was bring the  prescription in order to be able to be seen and work on getting the AFO for her left foot. With all that being said she still does not have an appointment and I'[name] not sure were things stand that regard. I will give her [name] new prescription today in order to contact them to get this set up. 09/25/18 on evaluation today patient actually appears to be doing about the same in regard to her toes ulcer. She does have [name] small areas which seems to have [name], [name] ([id number]) [id number] Page 4 of 22 [name] lot of callous buildup around the edge of the wound which is going to need sharp debridement today. She still is waiting to be scheduled for evaluation with [employer] for possibility of an AFO. She states there supposed to call her tomorrow to get this set up. Unfortunately it does appear that her foot specifically the toe area is showing signs of erythema. There does not appear to be any systemic infection which is in these good news. 10/02/18 on evaluation today patient actually appears to be doing about the same in regard to her toe ulcer. This really has not done too well although it's not significantly larger it's also not significantly smaller. She has been tolerating the dressing changes without complication. She actually has her appointment with [facility] and [facility] tomorrow to hopefully be measured for obtaining and AFO splint. I think this would be helpful preventing this from reoccurring. We had contemplated starting the cast this week although to be honest I am reluctant to do that as she's been having nausea, vomiting, and seizure activity over the past three days. She has [name] history of seizures and have been told is nothing that can be done for these. With that being said I do believe that along with the seizures have the nausea vomiting which upon further questioning doesn't seem to be the normal for her and makes me concerned for the possibility of infection or  something else going on. I discussed this with the patient and her mother during the office visit today. I do not feel the wound is effective but maybe something else. The responses this was "this just happens to her at times and we don't know why". They did not seem to be interested in going to the hospital to have this checked out further. 10/09/18 on evaluation today patient presents for follow-up concerning her ongoing toe ulcer. She has been tolerating the dressing changes without complication. Fortunately there does not appear to be any evidence of infection which is great news however I do think that the patient would benefit from going ahead for with the total contact cast. She's actually in [name] wheelchair today she tells me that she will use her walker  if we initiate the cast. I was very specific about the fact that if we were gonna do the cast I wanted to make sure that she was using the walker in order to prevent any falls. She tells me she does not have stairs that she has to traverse on [name] regular basis at her home. She has not had any seizures since last week again that something that happens to her often she tells me she did talk to [employer] and they said that it may take up to three weeks to get the brace approved for her. Hopefully that will not take that long but nonetheless in the meantime I do think the cast could be of benefit. 10/12/18 on evaluation today patient appears to be doing rather well in regard to her toe ulcer. It's just been [name] few days and already this is significantly improved both as far as overall appearance and size. Fortunately there's no sign of infection. She is here for her first obligatory cast change. 10/19/18 Seen today for follow up and management of left great toe ulcer. Wound continues to show improvement. Noted small open area with seroussang drainage with palpation. Denies any increased pain or recent fevers during visit. She will continue calcium alginate  with offloading shoe. Denies any questions or concerns during visit. 10/26/18 on evaluation today patient appears to be doing about the same as when I last saw her in regard to her wound bed. Fortunately there does not appear to be any signs of infection. Unfortunately she continues to have [name] breakdown in regard to the toe region any time that she is not in the cast. It takes almost no time at all for this to happen. Nonetheless she still has not heard anything from the brace being made by [employer] as to when exactly this will be available to her. Fortunately there is no signs of infection at this time. 10/30/18 on evaluation today patient presents for application of the total contact cast as we just received him this morning. Fortunately we are gonna be able to apply this to her today which is great news. She continues to have no significant pain which is good news. Overall I do feel like things have been improving while she was the cast is when she doesn't have [name] cast that things get worse. She still has not really heard anything from [employer] regarding her brace. 11/02/18 upon evaluation today patient's wound already appears to be doing significantly better which is good news. Fortunately there does not appear to be any signs of infection also good news. Overall I do think the total contact cast as before is helping to heal this area unfortunately it's just not gonna likely keep the area closed and healed without her getting her brace at least. Again the foot drop is [name] significant issue for her. 11/09/18 on evaluation today patient appears to be doing excellent in regard to her toe ulcer which in fact is completely healed. Fortunately we finally got the situation squared away with the paperwork which was needed to proceed with getting her brace approved by Medicaid. I have filled that out unfortunately that information has been sent to the orthopedic office that I worked at 2 1/2 years ago and not tired  Current wound care measures. Fortunately she seems to be doing very well at this time. 11/23/18 on evaluation today patient appears to be doing More Poorly Compared to Last Time I Saw Her. At [facility] She Had Completely Healed. Currently  she is continuing to have issues with reopening. She states that she just found out that the brace was approved through Medicaid now she just has to go get measured in order to have this fitted for her and then made. Subsequently she does not have an appointment for this yet that is going to complicate things we obviously cannot put her back in the cast if we do not have everything measured because they're not gonna be able to measure her foot while she is in the cast. 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 Obviously this is unfortunate and does have me somewhat worried as well. 11/30/18 on evaluation today patient's toe ulcer actually appears to be doing fairly well. The good news is she will be getting her brace in the shoes next week on Wednesday. Hopefully we will be able to get this to heal without having to go back in the cast however she may need the cast in order to get the wound completely heal and then go from there. Fortunately there's no signs of infection at this time. 12/07/18 on evaluation today patient fortunately did receive her brace and she states she could tell this definitely makes her walk better. With that being said she's been having issues with her toe where she noticed yesterday there was [name] lot of tissue that was loosing off this appears to be much larger than what it was previous. She also states that her leg has been read putting much across the top of her foot just about the ankle although this seems to be receiving somewhat. The total area is still red and appears to be someone infected as best I can tell. She is previously taken Bactrim  and that may be [name] good option for her today as well. We are gonna see what I wound culture shows as well and I think that this is definitely appropriate. With that being said outside of the culture I still need to initiate something in the interim and that's what I'[name] gonna go ahead and select Bactrim is [name] good option for her. 12/14/18 on evaluation today patient appears to be doing better in regard to her left great toe ulcer as compared to last week's evaluation. There's still some erythema although this is significantly improved which is excellent news. Overall I do believe that she is making good progress is still gonna take some time before she is where I would like her to be from the standpoint of being able to place her back into the total contact cast. Hopefully we will be where we need to be by next week. 12/21/18 on evaluation today patient actually appears to be doing poorly in regard to her toe ulcer. She's been tolerating the dressing changes without complication. Fortunately there's no signs of systemic infection although she does have [name] lot of drainage from the toe ulcer and this does seem to be causing some issues at this point. She does have erythema on the distal portion of her toe that appears to be likely cellulitis. 12/28/18 on evaluation today patient actually appears to be doing [name] little better in my pinion in regard to her toe ulcer. With that being said she still does have some evidence of infection at this time and for her culture she had both E. coli as well as enterococcus as organisms noted on evaluation. For that reason I  think that though the Keflex likely has treated the E. coli rather well this has really done nothing for the enterococcus. We are going to have to initiate treatment for this specifically. 01/04/19 on evaluation today patient's toe actually appears to be doing better from the standpoint of infection. She currently would like to see about putting the cash back  on I think that this is appropriate as long as she takes care of it and keeps it from getting wet. She is gonna have some drainage we can definitely pass this up with Drawtex and alginate to try to prevent as much drainage as possible from causing the problems. With that being said I do want to at least try her with the cast between now and Tuesday. If there any issues we can't continue to use it then I will discontinue the use of the cast at that point. 01/08/19 on evaluation today patient actually appears to be doing very well as far as her foot ulcer specifically the great toe on the left is concerned. She did have an area of rubbing on the medial aspect of her left ankle which again is from the cast. Fortunately there's no signs of infection at this point in this appears to be [name] very slight skin breakdown. The patient tells me she felt it rubbing but didn't think it was that bad. Fortunately there is no signs of active infection at this time which is good news. No fevers, chills, nausea, or vomiting noted at this time. 01/15/19 on evaluation today patient actually appears to be doing well in regard to her toe ulcer. Again as previous she seems to do well and she has the cast on which indicates to me that during the time she doesn't have [name] cast on she's putting way too much pressure on this region. Obviously I think that's gonna be an issue as with the current national emergency concerning the Covid-19 Virus it has been recommended that we discontinue the use of total contact casting by GALILEAH, PIGGEE [name] ([id number]) [id number] Page 5 of 22 the chief medical officer of our company, Dr. Maurine Minister. The reasoning is that if [name] patient becomes sick and cannot come into have the cast removed they could not just leave this on for an additional two weeks. Obviously the hospitals also do not want to receive patient's who are sick into the emergency department to potentially contaminate  the region and spread the Covid-19 Virus among other sick individuals within the hospital system. Therefore at this point we are suspending the use of total contact cast until the current emergency subsides. This was all discussed with the patient today as well. 01/22/19 on evaluation today patient's wound on her left great toe appears to be doing slightly worse than previously noted last week. She tells me that she has been on this quite [name] bit in fact she tells me she's been awake for 38 straight hours. This is due to the fact that she's having to care for grandparents because nobody else will. She has been taking care of them for five the last seven days since I've seen her they both have dementia his is from [name] stroke and her grandmother's was progressive. Nonetheless she states even her mom who knows her condition and situation has only help two of those days to take care of them she's been taking care of the rest. Fortunately there does not appear to be any signs of active infection in regard to her toe at this  point although obviously it doesn't look as good as it did previous. I think this is directly related to her not taking off the pressure and friction by way of taking things easy. Though I completely understand what's going on. 01/29/19 on evaluation today patient's tools are actually appears to be showing some signs of improvement today compared to last week's evaluation as far as not necessarily the overall size of the wound but the fact that she has some new skin growth in between the two ends of the wound opening. Overall I feel like she has done well she states that she had [name] family member give her what sounds to be [name] CAM walker boot which has been helpful as well. 02/05/19 on evaluation today patient's wound bed actually appears to be doing significantly better in regard to her overall appearance of the size of the wound. With that being said she is still having an issue with offloading  efficiently enough to get this to close. Apparently there is some signs of infection at this point as well unfortunately. Previously she's done well of Augmentin I really do not see anything that needs to be culture currently but there theme and cellulitis of the foot that I'[name] seeing I'[name] gonna go ahead and place her on an antibiotic today to try to help clear this up. 02/12/2019 on evaluation today patient actually appears to be doing poorly in regard to her overall wound status. She tells me she has been using her offloading shoe but actually comes in today wearing her tennis shoe with the AFO brace. Again as I previously discussed with her this is really not sufficient to allow the area to heal appropriately. Nonetheless she continues to be somewhat noncompliant and I do wonder based on what she has told my nurse in the past as to whether or not [name] good portion of this noncompliance may be recreational drug and alcohol related. She has had [name] history of heroin overdose and this was fairly recently in the past couple of months that have been seeing her. Nonetheless overall I feel like her wound looks significantly worse today compared to what it was previous. She still has significant erythema despite the Augmentin I am not sure that this is an appropriate medication for her infection I am also concerned that the infection may have gone down into her bone. 02/19/19 on evaluation today patient actually appears to be doing about the same in regard to her toe ulcer. Unfortunately she continues to show signs of bone exposure and infection at this point. There does not appear to be any evidence of worsening of the infection but I'[name] also not really sure that it's getting significantly better. She is on the Augmentin which should be sufficient for the Staphylococcus aureus infection that she has at this point. With that being said she may need IV antibiotics to more appropriately treat this. We did have [name]  discussion today about hyperbaric option therapy. 02/28/19 on evaluation today patient actually appears to be doing much worse in regard to the wound on her left great toe as compared to even my previous evaluation last week. Unfortunately this seems to be training in [name] pretty poor direction. Her toe was actually now starting to angle laterally and I can actually see the entire joint area of the proximal portion of the digit where is the distal portion of the digit again is no longer even in contact with the joint line. Unfortunately there's [name] lot more necrotic  tissue around the edge and the toe appears to be showing signs of becoming gangrenous in my pinion. I'[name] very concerned about were things stand at this point. She did see infectious disease and they are planning to send in [name] prescription for Sivextro for her and apparently this has been approved. With that being said I don't think she should avoid taking this but at the same time I'[name] not sure that it's gonna be sufficient to save her toe at this point. She tells me that she still having to care for grandparents which I think is putting quite [name] bit of strain on her foot and specifically the total area and has caused this to break down even to [name] greater degree than would've otherwise been expected. 03/05/19 on evaluation today patient actually appears to be doing quite well in regard to her toe all things considering. She still has bone exposed but there appears to be much less your thing on overall the appearance of the wound and the toe itself is dramatically improved. She still does have some issues currently obviously with infection she did see vascular as well and there concerned that her blood flow to the toad. For that reason they are setting up for an angiogram next week. 03/14/19 on evaluation today patient appears to be doing very poor in regard to her toe and specifically in regard to the ulceration and the fact that she's starting to notice  the toe was leaning even more towards the lateral aspect and the complete joint is visible on the proximal aspect of the joint. Nonetheless she's also noted [name] significant odor and the tip of the toe is turning more dark and necrotic appearing. Overall I think she is getting worse not better as far as this is concerned. For that reason I am recommending at this point that she likely needs to be seen for likely amputation. READMISSION 03/19/2021 This is [name] patient that we cared for in this clinic for [name] prolonged period of time in 2019 and 2020 with [name] left foot and left first toe wound. 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 Excell [name]. In December 2021 she had an ulcer on her right great toe as well as the fourth and fifth toes. 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 She also had an angiogram at that time and underwent angioplasty of the right anterior tibial artery. In any case she claims that the wound on the right foot is closed I did not look at this today which was probably an oversight although I think that should be done next week. 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 According to Dr. Bernette Redbird last review that she was out of the area being cared for by another physician but recently came back to his attention. 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 [name] culture of this area showed E. coli apparently before she came back to see Dr. Excell [name] she was supposed to be receiving antibiotics but she did not really take them. Nor is she offloading this area at all. Finally her last hemoglobin A1c listed in epic was in March 2022 at 14.1 she says things are [name] lot better since then although I am not sure. 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 She had this wound at the time. During this admission she had E. coli sepsis [name] TEE was negative. 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 She is still [name] heavy cigarette smoker. She has [name] history of bipolar disorder seizure disorder ABI in our clinic was 1.05 6/1; left midfoot in the setting of [name] TMA done previously. Round circular wound with [name] "knuckle" of protruding tissue. The problem is that the knuckle was not attached to any of the surrounding granulation and this probed proximally widely I removed [name] large portion of this tissue. This wound goes with considerable undermining laterally. I do not feel any bone there was no purulence but this is [name] deep wound. 6/8; in spite of the debridement I did last week. She arrives with [name] wound looking exactly the same. [name] protruding "knuckle" of tissue nonadherent to most of the surrounding tissue. There is considerable depth around this from 6-12 o'clock at 2.7 cm and undermining of 1 cm. This does not look overtly infected and the x- ray I did last week was negative for any osseous abnormalities. We have been using silver collagen 6/15; deep tissue culture I did last week showed moderate staph aureus and moderate Pseudomonas. This will definitely require prolonged antibiotic therapy. The pathology on the protuberant area was negative for malignancy fungus etc. 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 We have been using silver collagen. I am going to be prescribing Levaquin for 2 weeks. Her CT scan of the foot is down for 7/5 6/22; CT scan of the foot on 7 5. She says she has hardware in the left leg from  her previous fracture. She is on the Levaquin for the deep tissue culture I did that showed methicillin sensitive staph aureus and Pseudomonas. I gave her [name] 2-week supply and she will have another week. She arrives in clinic today with the same protuberant tissue however this is nonadherent to the tissue surrounding it. I am really at [name] loss to explain this unless there is underlying deep tissue infection 6/29; patient presents for 1 week follow-up. She has been using collagen to the wound bed. She reports taking her antibiotics as prescribed.She has no complaints or issues today. She denies signs of infection. [name], [name] ([id number]) [id number] Page 6 of 22 7/6; patient presents for one week followup. She has been using collagen to the wound bed. She states she is taking Levaquin however at times she is not able to keep it down. She denies signs of infection. 7/13; patient presents for 1 week follow-up. She has been using silver alginate to the wound bed. She still has nausea when taking her antibiotics. She denies signs of infection. 7/20; patient presents for 1 week follow-up. She has been using silver alginate with gentamicin cream to the wound bed. She denies any issues and has no complaints today. She denies signs of infection. 7/27; patient presents for 1 week follow-up. She continues to use silver alginate with gentamicin cream to the wound bed. She reports starting her antibiotics. She has no issues or complaints. Overall she reports stability to the wound. 8/3; patient presents for 1 week follow-up. She has been using silver alginate with gentamicin  cream to the wound bed. She reports completing all antibiotics. She has no issues or complaints today. She denies signs of infection. 8/17; patient presents for 2-week follow-up. He is to use silver alginate to the wound bed. She has no issues or complaints today. She denies signs of infection. She reports her  pain has improved in her foot since last clinic visit 8/24; patient presents for 1 week follow-up. She continues to use silver alginate to the wound bed. She has no issues or complaints. She denies signs of infection. Pain is stable. 9/7; patient presents for follow-up. She missed her last week appointment due to feeling ill. She continues to use silver alginate. She has [name] new wound to the right lower extremity that is covered in eschar. She states It occurred over the past week and has no idea how it started. She currently denies signs of infection. 9/14; patient presents for follow-up. T the left foot wound she has been using gentamicin cream and silver alginate. T the right lower extremity wound she has o o been keeping this covered and has not obtain Santyl. 9/21; patient presents for follow-up. She reports using gentamicin cream and silver alginate to the left foot and Santyl to the right lower extremity wound. She has no issues or complaints today. She denies signs of infection. 9/28; patient presents for follow-up. She reports [name] new wound to her right heel. She states this occurred [name] few days ago and is progressively gotten worse. She has been trying to clean the area with [name] Q-tip and Santyl. She reports stability in the other 2 wounds. She has been using gentamicin cream and silver alginate to the left foot and Santyl to the right lower extremity wound. 10/12; patient presents for follow-up. She reports improvement to the wound beds. She is seeing vein and vascular to discuss the potential of [name] left BKA. She states they are going to do an arteriogram. She continues to use silver alginate with dressing changes to her wounds. 11/2; patient presents for follow-up. She states she has not been doing dressing changes to the wound beds. She states she is not able to offload the areas. She reports chronic pain to her left foot wound. 11/9; patient presents for follow-up. She came in with only socks  on. She states she forgot to put on shoes. It is unclear if she is doing any dressing changes. She currently denies systemic signs of infection. 11/16; patient presents for follow-up. She came again only with socks on. She states she does not wear shoes ever. It is unclear if she does dressing changes. She currently denies systemic signs of infection. 11/23; patient presents for follow-up. She wore her shoes today. It still unclear exactly what dressing she is using for each wound but she did states she obtained Dakin's solution and has been using this to the left foot wound. She currently denies signs of infection. 11/30; patient presents for follow-up. She has no issues or complaints today. She currently denies signs of infection. 12/7; patient presents for follow-up. She has no issues or complaints today. She has been using Hydrofera Blue to the right heel wound and Dakin solution to the left foot wound. Her right anterior leg wound is healed. She currently denies signs of infection. 12/14; patient presents for follow-up. She has been using Hydrofera Blue to the right heel and Dakin's to the left foot wounds. She has no issues or complaints today. She denies signs of infection. 12/21; patient presents for follow-up.  She reports using Hydrofera Blue to the right heel and Dakin's to the left foot wound. She denies signs of infection. 12/28; patient presents for follow-up. She continues to use Dakin's to the left foot wound and Hydrofera Blue to the right heel wound. She denies signs of infection. 1/4; patient presents for follow-up. She has no issues or complaints today. She denies signs of infection. 1/11; patient presents for follow-up. It is unclear if she has been dressing these wounds over the past week. She currently denies signs of infection. 1/18; patient presents for follow-up. She states she has been using Dakin's wet-to-dry dressings to the left foot. She has been using Hydrofera Blue to  the right foot foot wound. She states that the anterior right leg wound has reopened and draining serous fluid. She denies signs of infection. 1/25; patient presents for follow-up. She has no issues or complaints today. 2/1; patient presents for follow-up. She has no issues or complaints today. She denies signs of infection. 2/8; patient presents for follow-up. She has lost her surgical shoes. She did not have [name] dressing to the right heel wound. She currently denies signs of infection. 2/15; patient presents for follow-up. She reports more pain to the right heel today. She denies purulent drainage Or fever/chills 2/22; patient presents for follow-up. She reports taking clindamycin over the past week. She states that she continues to have pain to her right heel. She reports purulent drainage. Readmission 03/16/2022 Ms. Delanna Blacketer is [name] 47 year old female with [name] past medical history of type 2 diabetes, osteomyelitis to her feet, chronic systolic heart failure and bipolar disorder that presents to the clinic for bilateral feet wounds and right lower extremity wound. She was last seen in our clinic on 12/15/2021. At that time she had purulent drainage coming out of her right plantar foot and I recommended she go to the ED. She states she went to [facility] and has been there for the past 3 months. I cannot see the records. She states she had OR debridement and was on several weeks of IV antibiotics while inpatient. Since discharge she has not been taking care of the wound beds. She had nothing on her feet other than socks today. She currently denies signs of infection. 5/31; patient presents for follow-up. She has been using Dakin's wet-to-dry dressings to the wound beds on her feet bilaterally and antibiotic ointment to the right anterior leg wound. She had [name] wound culture done at last clinic visit that showed moderate Pseudomonas aeruginosa sensitive to ciprofloxacin. She currently denies  systemic signs of infection. 6/14; patient presents for follow-up. She received Keystone 5 days ago and has been using this on the wound beds. She states that last week she had to go to the hospital because she had increased warmth and erythema to the right foot. She was started on 2 oral antibiotics. She states she has been taking these. She currently denies systemic signs of infection. She has no issues or complaints today. 6/21; patient presents for follow-up. She states she has been using Keystone antibiotics to the wound beds. She has no issues or complaints today. She denies signs of infection. 6/28; patient presents for follow-up. She has been using Keystone antibiotics to the wound beds. She has no issues or complaints today. [name], [name] ([id number]) [id number] Page 7 of 22 7/12; patient presents for follow-up. Has been using Keystone antibiotics to the wound beds with calcium alginate. She has no issues or complaints today. She never followed  up with her orthopedic surgeon who did the OR debridement to the right foot. We discussed the total contact cast for the left foot and patient would like to do this next week. 7/19; patient presents for follow-up. She has been using Keystone antibiotics with calcium alginate to the wound beds. She has no issues or complaints today. Patient is in agreement to do the total contact cast of the left foot today. She knows to return later this week for the obligatory cast change. 05-13-2022 upon evaluation today patient's wound which she has the cast of the left leg actually appears to be doing significantly better. Fortunately I do not see any signs of active infection locally or systemically which is great news and overall I am extremely pleased with where we stand currently. 7/26; patient presents for follow-up. She has [name] cast in place for the past week. She states it irritated her shin. Other than that she tolerated the cast  well. She states she would like [name] break for 1 week from the cast. We have been using Keystone antibiotic and Aquacel to both wound beds. She denies signs of infection. 8/2; patient presents for follow-up. She has been using Keystone and Aquacel to the wound beds. She denies any issues and has no complaints. She is agreeable to have the cast placed today for the left leg. 06-03-2022 upon evaluation today patient appears to be doing well with regard to her wound she saw some signs of improvement which is great news. Fortunately I do not see any evidence of active infection locally or systemically at this time which is great news. No fevers, chills, nausea, vomiting, or diarrhea. 8/16; patient presents for follow-up. She has no issues or complaints today. We have been using Keystone and Aquacel to the wound beds. The left lower extremity is in [name] total contact cast. She is tolerated this well. 8/23; patient presents for follow-up. She has had the total contact cast on the left leg for the past week. Unfortunately this has rubbed and broken down the skin to the medial foot. She currently denies signs of infection. She has been using Keystone antibiotic to the right plantar foot wound. 8/30; patient presents for follow-up. We have held off on the total contact cast for the left leg for the past week. Her wound on the left foot has improved and the previous surrounding breakdown of skin has epithelialized. She has been using Keystone antibiotic to both wound beds. She has no issues or complaints today. She denies signs of infection. 9/6; patient presents for follow-up. She has ordered her's Keystone antibiotic and this is arriving this week. She has been doing Dakin's wet-to-dry dressings to the wound beds. She denies signs of infection. She is agreeable to the total contact cast today. 9/13; patient presents for follow-up. She states that the cast caused her left leg shin to be sore. She would like to take  [name] break from the cast this week. She has been using Keystone antibiotic to the right plantar foot wound. She denies signs of infection. 9/20; patient presents for follow-up. She has been using Keystone antibiotics to the wound beds with calcium alginate to the right foot wound and Hydrofera Blue to the left foot wound. She is agreeable to having the cast placed today. She has been approved for Apligraf and we will order this for next clinic visit. 9/27; patient presents for follow-up. We have been using Keystone antibiotic with Hydrofera Blue to the left foot wound under [name]  total contact cast. T the right o foot wound she has been using Keystone antibiotic and calcium alginate. She declines [name] total contact cast today. Apligraf is available for placement and she would like to proceed with this. 07-28-2022 upon evaluation today patient appears to be doing well currently in regard to her wound. She is actually showing signs of significant improvement which is great news. Fortunately I do not see any evidence of active infection locally nor systemically at this time. She has been seeing Dr. Mikey Bussing and to be honest has been doing very well with the cast. Subsequently she comes in today with [name] cast on and we did reapply that today as well. She did not really want to she try to talk me out of that but I explained that if she wanted to heal this is really the right way to go. Patient voiced understanding. In regard to her right foot this is actually [name] lot better compared to the last time I saw her which is also great news. 10/11; patient presents for follow-up. Apligraf and the total contact cast was placed to the left leg at last clinic visit. She states that her right foot wound had burning pain to it with the placement of Apligraf to this area. She has been doing Linn over this area. She denies signs of infection including increased warmth, erythema or purulent drainage. 11/1; 3-week follow-up. The  patient fortunately did not have [name] total contact cast or an Apligraf and on the left foot. She has been using Keystone ABD pads and kerlix and her own running shoes She arrives in clinic today with thick callus and [name] very poor surface on the left foot on the right nonviable skin subcutaneous tissue and [name] deep probing hole. 11/15; patient missed her last clinic appointment. She states she has not been dressing the wound beds for the past 2 weeks. She states that at she had [name] new roommate but is now going back to live with her mother. Apparently its been [name] distracting 2 weeks. Patient currently denies signs of infection. 11/22; patient presents for follow-up. She states she has been using Keystone antibiotic and Dakin's wet-to-dry dressings to the wound beds. She is agreeable for cast placement today. We had ordered Apligraf however this has not been received by our facility. 11/29; Patient had [name] total contact cast placed at last clinic visit and she tolerated this well. We were using silver alginate under the cast. Patient's been using Keystone antibiotic with Aquacel to the right plantar foot wound. She has no issues or complaints today. Apligraf is available for placement today. Patient would like to proceed with this. 12/6; patient presents for follow-up. She had Apligraf placed in standard fashion last clinic visit under the total contact cast to the left lower extremity. She has been using Keystone antibiotic and Aquacel to the right plantar foot wound. She has no issues or complaints today. 12/13; patient presents for follow-up. She has finished 5 Apligraf placements. Was told she would not qualify for more. We have been doing [name] total contact cast to the left lower extremity. She has been using Keystone antibiotic and Aquacel to the right plantar foot wound. She has no issues or complaints today. 12/20; patient presents for follow-up. We have been using Hydrofera Blue with Keystone antibiotic  under [name] total contact cast of the left lower extremity. She reports using Keystone antibiotic and silver alginate to the right heel wound. She has no issues or complaints today. 12/27;  patient presents with [name] healthy wound on the left midfoot. We have Apligraf to apply that to that more also using [name] total contact cast. On the right we are using Keystone and silver alginate. She is offloading the right heel with [name] surgical shoealthough by her admission she is on her feet quite [name] bit 1/3; patient presents for follow-up. Apligraf was placed to the wound beds last clinic visit. She was placed in [name] total contact cast to the left lower extremity. She declines [name] total contact cast today. She states that her mother is in the hospital and she cannot adequately get around with the cast on. 1/10; patient presents for follow-up. She declined the total contact cast at last clinic visit. Both wounds have declined in appearance. She states that she has been on her feet and not offloading the wound beds. She currently denies signs of infection. 1/17; patient presents for follow-up. She had the total contact cast along with Apligraf placed last week to the left lower extremity. She tolerated this well. She has been using Aquacel Ag and Keystone antibiotic to the right heel wound. She currently denies signs of infection. She has no issues or complaints today. 1/24; patient presents for follow-up. We have been using Apligraf to the left foot wound along with [name] total contact cast. She has done well with this. T the right o heel wound she has been using Aquacel Ag and Keystone antibiotic ointment. She has no issues or complaints today. She denies signs of infection. [name], [name] ([id number]) [id number] Page 8 of 22 1/31; patient presents for follow-up. We have been using Apligraf to the left foot wound along with the total contact cast. She continues to do well with this. To the right heel  we have been using Aquacel Ag and Keystone antibiotic ointment. She has no issues or complaints today. 12-01-2022 upon evaluation patient is seen today on my schedule due to the fact that she unfortunately was in the hospital yesterday. Her cast needed to come off the only reason she is out of the hospital is due to the fact that they would not take it off at the hospital which is somewhat bewildered reading to me to be perfectly honest. I am not certain why this was but either way she was released and then was placed on my schedule today in order to get this off and reapply the total contact casting as appropriate. I do not have an Apligraf for her today it was applied last week and today's actually expired yesterday as there was some scheduling conflicts with her being in the hospital. Nonetheless we do not have that for reapplication today but the good news that she is not draining too much and the Apligraf can go for up to 2 weeks so I am going to go ahead and reapply the total contact casting but we are going to leave the Apligraf in place. 2/14; patient presents for follow-up. T the left leg she has had the total contact cast and Apligraf for the past week. She has had no issues with this. T the o o right heel she has been using antibiotic ointment and Aquacel Ag. 2/21; patient presents for follow-up. We have been using Apligraf and [name] total contact cast to the left lower extremity. She is tolerated this well. Unfortunately she is not approved for any more Apligraf per insurance. She has been using antibiotic ointment and Aquacel Ag to the right foot. She has no issues or complaints  today. 2/28; patient presents for follow-up. We have been using Hydrofera Blue and antibiotic ointment under the total contact cast to the left lower extremity. He has been using Aquacel Ag with antibiotic ointment to the right plantar foot. She has no issues or complaints today. 3/6; patient presents for follow-up. She  did not obtain her gentamicin ointment. She has been using Aquacel Ag to the right plantar foot wound. We have been using Hydrofera Blue with antibiotic ointment under the total contact cast to the left lower extremity. She has no issues or complaints today. 3/12; patient presents for follow-up. She has been using gentamicin ointment and Aquacel Ag to the right plantar foot wound. We have been using Hydrofera Blue with antibiotic ointment under the total contact cast on the left lower extremity foot wound. She has no issues or complaints today. 3/20; patient presents for follow-up. She has been using gentamicin ointment and Aquacel Ag to the right plantar foot wound. We have been using Hydrofera Blue with antibiotic ointment under the total contact cast to the left lower extremity wound. She has no issues or complaints today. 3/27; patient presents for follow-up. We have been using antibiotic ointment and Aquacel Ag to the right plantar foot wound. We have been using Hydrofera Blue with antibiotic ointment under the total contact cast to the left lower extremity. Both wounds are smaller. 01-24-2023 upon evaluation today patient actually appears to be doing better compared to last time I saw her. It has been several weeks since I last saw her but nonetheless I think the total contact casting is doing [name] good job. Fortunately I do not see any evidence of infection or worsening overall which is great news and in general I do believe that she is moving in the appropriate direction. It has been [name] very long road but nonetheless I feel like she is nearing the end at least in regard to the left foot and even the right foot looks much better to be perfectly honest. 01-31-2023 upon evaluation today patient appears to be doing well currently in regard to her wounds. She has been require some sharp debridement which I think is probably bone both sides to remove [name] lot of callus that has built up at this point. I  discussed that with her today. Also think total contact cast is doing well for the left foot Emina continue that as well. 4/17; patient presents for follow-up. We have been doing Hydrofera Blue with antibiotic ointment under the total contact cast to the left lower extremity and Aquacel Ag with antibiotic ointment to the right foot wound. She has been using her offloading heel shoe here. 4/24; patient presents for follow-up. We have been doing Hydrofera Blue with antibiotic ointment under the total contact cast to the left lower extremity and Aquacel Ag with antibiotic ointment to the right foot wound. She has been using her offloading heel shoe here. Wounds are smaller with slough accumulation. 5/1; patient presents for follow-up. We have been doing Hydrofera Blue with antibiotic ointment under the total contact cast to the left lower extremity and Aquacel Ag with antibiotic ointment to the right foot wound. Left foot wound is stable and right foot wound appears smaller. 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 On the left she is using Hydrofera Blue topical antibiotic under [name] total contact cast, on the right plantar heel gentamicin Aquacel Ag. According to our intake nurse both are making slow but steady improvements. 5/15; patient presents for follow-up. We have  been doing Hydrofera Blue with antibiotic ointment under the total contact cast on the left and antibiotic ointment with Aquacel Ag on the right. Unfortunately the left foot wound has declined in size and appearance. We also do not have the total contact cast available in office today. Patient denies signs of infection. 5/22; patient presents for follow-up. We have been doing Hydrofera Blue and antibiotic ointment to the left foot and Aquacel Ag with antibiotic ointment to the right foot. We took [name] break from the cast since we did not have it placed at last week. Wounds look stable if not slightly improved. 03-27-2023 patient presents  today she has had this For Most 2 Weeks Because She Was in the Hospital and They Did Not T It off. Again That Is Really Not ake Something That She Could Control but Nonetheless We Are Seeing Her at This Point Today for Reevaluation. We Do Want to Go and See about Reapply the Cast. Again We Had All Set Everything up to Go Ahead and Do This When We Went to Get the Cast Only to Realize That Not Had Come in and We Were out at the Moment. With That Being York Spaniel We Will Get Have T Get the Exam before We Can Reapply the T Contact Casting Going Forward. o otal 6/12; patient presents for follow-up. She has been using Hydrofera Blue to the left foot wound and Aquacel Ag to the right foot wound. No cast was available last week placed on the patient so she has been offloading with her surgical shoe. She has no issues or complaints today. 6/19; patient presents for follow-up. We have been using Hydrofera Blue to the left foot wound under the total contact cast and Aquacel Ag to the right wound. She has no issues or complaints today. The wounds are smaller. 6/26; patient presents for follow-up. We have been using Hydrofera Blue to the left foot wound under the total contact cast and Aquacel Ag to the right foot wound. Wounds are smaller. Patient has no issues or complaints today. 7/3; patient presents for follow-up. We have been using Hydrofera Blue to the left foot wound under the total contact cast and Aquacel Ag to the right foot wound. Wounds Appear smaller. No signs of infection. 7/10; patient presents for follow-up. We have been using Hydrofera Blue to the left foot wound under the total contact cast and Aquacel Ag to the right foot wound. Left foot wound is smaller. Right foot wound is stable. Electronic Signature(s) Signed: 05/03/2023 12:00:03 PM By: Heather Corwin DO Entered By: [name] on 05/03/2023 11:56:51 [name] ([id number]) [id number] Page 9 of  22 -------------------------------------------------------------------------------- Physical Exam Details Patient Name: Date of Service: SHAYANNE, GOMM 05/03/2023 11:00 [name] Medical Record Number: [id number] Patient Account Number: [id number] Date of Birth/Sex: Treating RN: [birth year] (47 y.o. [name] Primary Care Provider: Lindwood [name] Other Clinician: Betha [name] Referring Provider: Treating Provider/Extender: [name] in Treatment: 39 Constitutional . Cardiovascular . Psychiatric . Notes Right foot: T the plantar heel there is an incision site with increased depth, Granulation tissue at the opening. Left foot: T the medial aspect there is an open o o wound with granulation tissue, Callus and slough build up. No overt signs of infection to any of the wound beds. Electronic Signature(s) Signed: 05/03/2023 12:00:03 PM By: Heather Corwin DO Entered By: [name] on 05/03/2023 11:57:29 -------------------------------------------------------------------------------- Physician Orders Details Patient Name: Date of Service: [name], Courtnie [name]. 05/03/2023  11:00 [name] Medical Record Number: [id number] Patient Account Number: [id number] Date of Birth/Sex: Treating RN: [birth year] (47 y.o. [name] Primary Care Provider: Lindwood [name] Other Clinician: Betha [name] Referring Provider: Treating Provider/Extender: [name] in Treatment: 69 Verbal / Phone Orders: Yes Clinician: Huel Coventry Read Back and Verified: Yes Diagnosis Coding Follow-up Appointments Return Appointment in 1 week. Nurse Visit as needed Bathing/ Shower/ Hygiene May shower with wound dressing protected with water repellent cover or cast protector. No tub bath. Anesthetic (Use 'Patient Medications' Section for Anesthetic Order Entry) Lidocaine applied to wound bed TWILA, RAPPA [name] ([id number]) [id number]  Page 10 of 22 Edema Control - Lymphedema / Segmental Compressive Device / Other Elevate, Exercise Daily and [name] void Standing for Long Periods of Time. Elevate legs to the level of the heart and pump ankles as often as possible Elevate leg(s) parallel to the floor when sitting. Off-Loading Total Contact Cast to Left Lower Extremity - tcc applied left leg Open toe surgical shoe - Heel off loader - Right foot- Other: - keep pressure off of feet. Additional Orders / Instructions Follow Nutritious Diet and Increase Protein Intake Other: - Stay off of foot, wheelchair only Wound Treatment Wound #11 - Foot Wound Laterality: Plantar, Right Cleanser: Wound Cleanser 3 x Per Week/30 Days Discharge Instructions: Wash your hands with soap and water. Remove old dressing, discard into plastic bag and place into trash. Cleanse the wound with Wound Cleanser prior to applying [name] clean dressing using gauze sponges, not tissues or cotton balls. Do not scrub or use excessive force. Pat dry using gauze sponges, not tissue or cotton balls. Prim Dressing: Aquacel Extra Hydrofiber Dressing, 2x2 (in/in) ary 3 x Per Week/30 Days Secondary Dressing: ABD Pad 5x9 (in/in) (Generic) 3 x Per Week/30 Days Discharge Instructions: Cover with ABD pad Secured With: Medipore T - 62M Medipore H Soft Cloth Surgical T ape ape, 2x2 (in/yd) 3 x Per Week/30 Days Secured With: [employer] or Non-Sterile 6-ply 4.5x4 (yd/yd) 3 x Per Week/30 Days Discharge Instructions: Apply Kerlix as directed Wound #12 - Foot Wound Laterality: Plantar, Left, Medial Cleanser: Wound Cleanser 1 x Per Week/30 Days Discharge Instructions: Wash your hands with soap and water. Remove old dressing, discard into plastic bag and place into trash. Cleanse the wound with Wound Cleanser prior to applying [name] clean dressing using gauze sponges, not tissues or cotton balls. Do not scrub or use excessive force. Pat dry using gauze sponges, not tissue or cotton  balls. Prim Dressing: Hydrofera Blue Ready Transfer Foam, 2.5x2.5 (in/in) 1 x Per Week/30 Days ary Discharge Instructions: Apply Hydrofera Blue Ready to wound bed as directed Secondary Dressing: heel cup 1 x Per Week/30 Days Discharge Instructions: x2 for heel and front of foot Secured With: Medipore T - 62M Medipore H Soft Cloth Surgical T ape ape, 2x2 (in/yd) 1 x Per Week/30 Days Secured With: [employer] or Non-Sterile 6-ply 4.5x4 (yd/yd) 1 x Per Week/30 Days Discharge Instructions: Apply Kerlix as directed Electronic Signature(s) Signed: 05/03/2023 12:00:03 PM By: Heather Corwin DO Entered By: [name] on 05/03/2023 11:59:29 -------------------------------------------------------------------------------- Problem List Details Patient Name: Date of Service: [name] 05/03/2023 11:00 [name] Medical Record Number: [id number] Patient Account Number: [id number] Date of Birth/Sex: Treating RN: 07/26/1976 (47 y.o. [name] Primary Care Provider: Lindwood [name] Other Clinician: Betha [name] Referring Provider: Treating Provider/Extender: [name] in Treatment: [address], Aicha [name] ([id number]) [id number] Page 11 of 22  Active Problems ICD-10 Encounter Code Description 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 03/16/2022 No Yes E11.621 Type 2 diabetes mellitus with foot ulcer 03/16/2022 No Yes E11.42 Type 2 diabetes mellitus with diabetic polyneuropathy 03/16/2022 No Yes 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 05/03/2023 11:00 [name] Medical Record Number: [id number] Patient Account Number: [id number] Date of Birth/Sex: Treating RN: 1976/08/11 (47 y.o. [name] Primary Care Provider: Lindwood [name] Other Clinician: Betha [name] Referring Provider: Treating Provider/Extender: [name] in Treatment: 9 Subjective Chief Complaint Information obtained from Patient 03/19/2021; patient referred by Dr. Excell [name] who has been looking after her left foot for quite [name] period of time for review of [name] nonhealing area in the left midfoot 03/12/2022; bilateral feet wounds and right lower extremity wound. History of Present Illness (HPI) 01/18/18-She is here for initial evaluation of the left great toe ulcer. She is [name] poor historian in regards to timeframe in detail. She states approximately 4 weeks ago she lacerated her toe on something in the house. She followed up with her primary care who placed her on Bactrim and ultimately [name] second dose of Bactrim prior to coming to wound clinic. She states she has been treating the toe with peroxide, Betadine and [name] Band-Aid. She did not check her blood sugar this morning but checked it yesterday morning it was 327; she is unaware of [name] recent A1c and there are no current records. She saw Dr. she would've orthopedics last week for an old injury to the left ankle, she states he did not see her toe, nor did she bring it to his attention. She smokes approximately 1 pack cigarettes [name], [name] ([id number]) [id number] Page 12 of 22 [name] day. Her social situation is concerning, she arrives this morning with her mother who appears extremely intoxicated/under the influence; her mother was asked to leave  the room and be monitored by the patient's grandmother. The patient's aunt then accompanied the patient and the room throughout the rest of the appointment. We had [name] lengthy discussion regarding the deleterious effects of uncontrolled hyperglycemia and smoking as it relates to wound healing and overall health. She was strongly encouraged to decrease her smoking and get her diabetes under better control. She states she is currently on [name] diet and has cut down her [facility] consumption. The left toe is erythematous, macerated and slightly edematous with malodor present. The edema in her left foot is below her baseline, there is no erythema streaking. We will treat her with Santyl, doxycycline; we have ordered and xray, culture and provided [name] Peg assist surgical shoe and cultured the wound. 01/25/18-She is here in follow-up evaluation for [name] left great toe ulcer and presents with an abscess to her suprapubic area. She states her blood sugars remain elevated, feeling "sick" and if levels are below 250, but she is trying. She has made no attempt to decrease her smoking stating that we "can't take away her food in her cigarettes". She has been compliant  with offloading using the PEG assist you. She is using Santyl daily. the culture obtained last week grew staph aureus and Enterococcus faecalis; continues on the doxycycline and Augmentin was added on Monday. The suprapubic area has erythema, no femoral variation, purple discoloration, minimal induration, was accessed with [name] cotton tip applicator with sanguinopurulent drainage, this was cultured, I suspect the current antibiotic treatment will cover and we will not add anything to her current treatment plan. She was advised to go to urgent care or ER with any change in redness, induration or fever. 02/01/18-She is here in follow-up evaluation for left great toe ulcers and [name] new abdominal abscess from last week. She was able to use packing until earlier  this week, where she "forgot it was there". She states she was feeling ill with GI symptoms last week and was not taking her antibiotic. She states her glucose levels have been predominantly less than 200, with occasional levels between 200-250. She thinks this was contributing to her GI symptoms as they have resolved without intervention. There continues to be significant laceration to left toe, otherwise it clinically looks stable/improved. There is now less superficial opening to the lateral aspect of the great toe that was residual blister. We will transition to [facility] to all wounds, she will continue her Augmentin. If there is no change or deterioration next week for reculture. 02/08/18-She is here in follow-up evaluation for left great toe ulcer and abdominal ulcer. There is an improvement in both wounds. She has been wrapping her left toe with coban, not by our direction, which has created an area of discoloration to the medial aspect; she has been advised to NOT use coban secondary to her neuropathy. She states her glucose levels have been high over this last week ranging from 200-350, she continues to smoke. She admits to being less compliant with her offloading shoe. We will continue with same treatment plan and she will follow-up next week. 02/15/18-She is here in follow-up evaluation for left great toe ulcer and abdominal ulcer. The abdominal ulcer is epithelialized. The left great toe ulcer is improved and all injury from last week using the Coban wrap is resolved, the lateral ulcer is healed. She admits to noncompliance with wearing offloading shoe and admits to glucose levels being greater than 300 most of the week. She continues to smoke and expresses no desire to quit. There is one area medially that probes deeper than it has historically, erythema to the toe and dorsal foot has consistently waxed and waned. There is no overt signs of cellulitis or infection but we will culture  the wound for any occult infection given the new area of depth and erythema. We will hold off on sensitivities for initiation of antibiotic therapy. 02/22/18-She is here in follow up evaluation for left great toe ulcer. There is overall significant improvement in both wound appearance, erythema and edema with changes made last week. She was not initiated on antibiotic therapy. Culture obtained last week showed oxacillin sensitive staph aureus, sensitive to clindamycin. Clindamycin has been called into the pharmacy but she has been instructed to hold off on initiation secondary to overall clinical improvement and her history of antibiotic intolerance. She has been instructed to contact the clinic with any noted changes/deterioration and the wound, erythema, edema and/or pain. She will follow-up next week. She continues to smoke and her glucose levels remain elevated >250; she admits to compliance with offloading shoe 03/01/18 on evaluation today patient appears to be doing fairly  well in regard to her left first toe ulcer. She has been tolerating the dressing changes with the [facility] Dressing without complication and overall this has definitely showed signs of improvement according to records as well is what the patient tells me today. I'[name] very pleased in that regard. She is having no pain today 03/08/18 She is here for follow up evaluation of [name] left great toe ulcer. She remains non-compliant with glucose control and smoking cessation; glucose levels consistently >200. She states that she got new shoe inserts/peg assist. She admits to compliance with offloading. Since my last evaluation there is significant improvement. We will switch to prisma at this time and she will follow up next week. She is noted to be tachycardic at this appointment, heart rate 120s; she has [name] history of heart rate 70-130 according to our records. She admits to extreme agitation r/t personal issues; she was advised to monitor  her heartrate and contact her physician if it does not return to [name] more normal range (<100). She takes cardizem twice daily. 03/15/18-She is here in follow-up evaluation for left great toe ulcer. She remains noncompliant with glucose control and smoking cessation. She admits to compliance with wearing offloading shoe. The ulcer is improved/stable and we will continue with the same treatment plan and she will follow-up next week 03/22/18-She is here for evaluation for left great toe ulcer. There continues to be significant improvement despite recurrent hyperglycemia (over 500 yesterday) and she continues to smoke. She has been compliant with offloading and we will continue with same treatment plan and she will follow-up next week. 03/29/18-She is here for evaluation for left great toe ulcer. Despite continuing to smoke and uncontrolled diabetes she continues to improve. She is compliant with offloading shoe. We will continue with the same treatment plan and she will follow-up next week 04/05/18- She is here in follow up evaluation for [name] left great toe ulcer; she presents with small pustule to left fifth toe (resembles ant bite). She admits to compliance with wearing offloading shoe; continues to smoke or have uncontrolled blood glucose control. There is more callus than usual with evidence of bleeding; she denies known trauma. 04/12/18-She is here for evaluation of left great toe ulcer. Despite noncompliance with glycemic control and smoking she continues to make improvement. She continues to wear offloading shoe. The pustule, that was identified last week, to the left fifth toe is resolved. She will follow-up in 2 weeks 05/03/18-she is seen in follow-up evaluation for [name] left great toe ulcer. She is compliant with offloading, otherwise noncompliant with glycemic control and smoking. She has plateaued and there is minimal improvement noted. We will transition to [name], replaced the insert to her  surgical shoe and she will follow-up in one week 05/10/18- She is here in follow up evaluation for [name] left great toe ulcer. It appears stable despite measurement change. We will continue with same treatment plan and follow up next week. 05/24/18-She is seen in follow-up evaluation for [name] left great toe ulcer. She remains compliant with offloading, has made significant improvement in her diet, decreasing the amount of sugar/soda. She said her recent A1c was 10.9 which is lower than. She did see [name] diabetic nutritionist/educator yesterday. She continues to smoke. We will continue with the same treatment plan and she'll follow-up next week. 05/31/18- She is seen in follow-up evaluation for left great toe ulcer. She continues to remain compliant with offloading, continues to make improvement in her diet, increasing her water  and decreasing the amount of sugar/soda. She does continue to smoke with no desire to quit. We will apply Prisma to the depth and Hydrofera Blue over. We have not received insurance authorization for oasis. She will follow up next week. 06/07/18-She is seen in follow-up evaluation for left great toe ulcer. It has stalled according to today's measurements although base appears stable. She says she saw [name] diabetic educator yesterday; her average blood sugars are less than 300 which is an improvement for her. She continues to smoke and states "that's my next step" She continues with water over soda. We will order for xray, culture and reinstate ace wrap compression prior to placing apligraf for next week. She is voicing no complaints or concerns. Her dressing will change to iodoflex over the next week in preparation for apligraf. 06/14/18-She is seen in follow-up evaluation for left great toe ulcer. Plain film x-ray performed last week was negative for osteomyelitis. Wound culture obtained last week grew strep B and OSSA; she is initiated on keflex and cefdinir today; there is erythema to the toe  which could be from ace wrap compression, she has [name] history of wrapping too tight and has has been encouraged to maintain ace wraps that we place today. We will hold off on application of apligraf today, will apply next week after antibiotic therapy has been initiated. She admits today that she has resumed taking [name] shower with her foot/toe submerged in water, she has been reminded to keep foot/toe out of the bath water. She will be seen in follow up next week 06/21/18-she is seen in follow-up evaluation for left great toe ulcer. She is tolerating antibiotic therapy with no GI disturbance. The wound is stable. Apligraf was applied today. She has been decreasing her smoking, only had 4 cigarettes yesterday and 1 today. She continues being more compliant in diabetic diet. She will follow-up next week for evaluation of site, if stable will remove at 2 weeks. 06/28/18- She is here in follow up evalution. Apligraf was placed last week, she states the dressing fell off on Tuesday and she was dressing with hydrofera blue. She is healed and will be discharged from the clinic today. She has been instructed to continue with smoking cessation, continue monitoring glucose levels, offloading for an additional 4 weeks and continue with hydrofera blue for additional two weeks for any possible microscopic opening. Readmission: 08/07/18 on evaluation today patient presents for reevaluation concerning the ulcer of her right great toe. She was previously discharged on 06/28/18 healed. Nonetheless she states that this began to show signs of drainage she subsequently went to her primary care provider. Subsequently an x-ray was performed on 08/01/18 which was negative. The patient was also placed on antibiotics at that time. Fortunately they should have been effective for the infection. Nonetheless she's been experiencing some improvement but still has [name] lot of drainage coming from the wound itself. 08/14/18 on evaluation today  patient's wound actually does show signs of improvement in regard to the erythema at this point. She has completed the antibiotics. With that being said we did discuss the possibility of placing her in [name] total contact cast as of today although I think that I may want to give this just ZARINA, PE [name] ([id number]) [id number] Page 13 of 22 [name] little bit more time to ensure nothing recurrence as far as her infection is concerned. I do not want to put in the cast and risk infection at that time if things are not completely  resolved. With that being said she is gonna require some debridement today. 08/21/18 on evaluation today patient actually appears to be doing okay in regard to her toe ulcer. She's been tolerating the dressing changes without complication. With that being said it does appear that she is ready and in fact I think it's appropriate for [place] to go ahead and initiate the total contact cast today. Nonetheless she will require some sharp debridement to prepare the wound for application. Overall I feel like things have been progressing well but we do need to do something to get this to close more readily. 08/24/18 patient seen today for reevaluation after having had the total contact cast applied on Tuesday. She seems to have done very well the wound appears to be doing great and overall I'[name] pleased with the progress that she's made. There were no abnormal areas of rubbing from the cast on her lower extremity. 08/30/18 on evaluation today patient actually appears to be completely healed in regard to her plantar toe ulcer. She tells me at this point she's been having [name] lot of issues with the cast. She almost fell [name] couple of times the state shall the step of her dog [name] couple times as well. This is been [name] very frustrating process for her other nonetheless she has completely healed the wound which is excellent news. Overall there does not appear to be the evidence of  infection at this time which is great news. 09/11/18 evaluation today patient presents for follow-up concerning her great toe ulcer on the left which has unfortunately reopened since I last saw her which was only [name] couple of weeks ago. Unfortunately she was not able to get in to get the shoe and potentially the AFO that's gonna be necessary due to her left foot drop. She continues with offloading shoe but this is not enough to prevent her from reopening it appears. When we last had her in the total contact cast she did well from [name] healing standpoint but unfortunately the wound reopened as soon as she came out of the cast within just [name] couple of weeks. 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 This is also part of the reason why she falls on [name] regular basis. I just do not believe that is gonna be safe for [place] to reinitiate the total contact cast as last time we had this on she fell 3 times one week which is definitely not normal for her. 09/18/18 upon evaluation today the patient actually appears to be doing about the same in regard to her toe ulcer. She did not contact [employer] as I asked her to even though I had given her the prescription. In fact she actually states that she has no idea where the prescription is. She did apparently call [employer] and they told her that all she needed to do was bring the prescription in order to be able to be seen and work on getting the AFO for her left foot. With all that being said she still does not have an appointment and I'[name] not sure were things stand that regard. I will give her [name] new prescription today in order to contact them to get this set up. 09/25/18 on evaluation today patient actually appears to be doing about  the same in regard to her toes ulcer. She does have  [name] small areas which seems to have [name] lot of callous buildup around the edge of the wound which is going to need sharp debridement today. She still is waiting to be scheduled for evaluation with [employer] for possibility of an AFO. She states there supposed to call her tomorrow to get this set up. Unfortunately it does appear that her foot specifically the toe area is showing signs of erythema. There does not appear to be any systemic infection which is in these good news. 10/02/18 on evaluation today patient actually appears to be doing about the same in regard to her toe ulcer. This really has not done too well although it's not significantly larger it's also not significantly smaller. She has been tolerating the dressing changes without complication. She actually has her appointment with [employer] and [place] tomorrow to hopefully be measured for obtaining and AFO splint. I think this would be helpful preventing this from reoccurring. We had contemplated starting the cast this week although to be honest I am reluctant to do that as she's been having nausea, vomiting, and seizure activity over the past three days. She has [name] history of seizures and have been told is nothing that can be done for these. With that being said I do believe that along with the seizures have the nausea vomiting which upon further questioning doesn't seem to be the normal for her and makes me concerned for the possibility of infection or something else going on. I discussed this with the patient and her mother during the office visit today. I do not feel the wound is effective but maybe something else. The responses this was "this just happens to her at times and we don't know why". They did not seem to be interested in going to the hospital to have this checked out further. 10/09/18 on evaluation today patient presents for follow-up concerning her ongoing toe ulcer. She has been tolerating the dressing changes  without complication. Fortunately there does not appear to be any evidence of infection which is great news however I do think that the patient would benefit from going ahead for with the total contact cast. She's actually in [name] wheelchair today she tells me that she will use her walker if we initiate the cast. I was very specific about the fact that if we were gonna do the cast I wanted to make sure that she was using the walker in order to prevent any falls. She tells me she does not have stairs that she has to traverse on [name] regular basis at her home. She has not had any seizures since last week again that something that happens to her often she tells me she did talk to [employer] and they said that it may take up to three weeks to get the brace approved for her. Hopefully that will not take that long but nonetheless in the meantime I do think the cast could be of benefit. 10/12/18 on evaluation today patient appears to be doing rather well in regard to her toe ulcer. It's just been [name] few days and already this is significantly improved both as far as overall appearance and size. Fortunately there's no sign of infection. She is here for her first obligatory cast change. 10/19/18 Seen today for follow up and management of left great toe ulcer. Wound continues to show improvement. Noted small open area with seroussang drainage with palpation. Denies any increased pain or  recent fevers during visit. She will continue calcium alginate with offloading shoe. Denies any questions or concerns during visit. 10/26/18 on evaluation today patient appears to be doing about the same as when I last saw her in regard to her wound bed. Fortunately there does not appear to be any signs of infection. Unfortunately she continues to have [name] breakdown in regard to the toe region any time that she is not in the cast. It takes almost no time at all for this to happen. Nonetheless she still has not heard anything from the brace  being made by [employer] as to when exactly this will be available to her. Fortunately there is no signs of infection at this time. 10/30/18 on evaluation today patient presents for application of the total contact cast as we just received him this morning. Fortunately we are gonna be able to apply this to her today which is great news. She continues to have no significant pain which is good news. Overall I do feel like things have been improving while she was the cast is when she doesn't have [name] cast that things get worse. She still has not really heard anything from [employer] regarding her brace. 11/02/18 upon evaluation today patient's wound already appears to be doing significantly better which is good news. Fortunately there does not appear to be any signs of infection also good news. Overall I do think the total contact cast as before is helping to heal this area unfortunately it's just not gonna likely keep the area closed and healed without her getting her brace at least. Again the foot drop is [name] significant issue for her. 11/09/18 on evaluation today patient appears to be doing excellent in regard to her toe ulcer which in fact is completely healed. Fortunately we finally got the situation squared away with the paperwork which was needed to proceed with getting her brace approved by Medicaid. I have filled that out unfortunately that information has been sent to the orthopedic office that I worked at 2 1/2 years ago and not tired Current wound care measures. Fortunately she seems to be doing very well at this time. 11/23/18 on evaluation today patient appears to be doing More Poorly Compared to Last Time I Saw Her. At [facility] She Had Completely Healed. Currently she is continuing to have issues with reopening. She states that she just found out that the brace was approved through Medicaid now she just has to go get measured in order to have this fitted for her and then made. Subsequently she does not  have an appointment for this yet that is going to complicate things we obviously cannot put her back in the cast if we do not have everything measured because they're not gonna be able to measure her foot while she is in the cast. 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 Obviously this is unfortunate and does have me somewhat worried as well. 11/30/18 on evaluation today patient's toe ulcer actually appears to be doing fairly well. The good news is she will be getting her brace in the shoes next week on Wednesday. Hopefully we will be able to get this to heal without having to go back in the cast however she may need the cast in order to get the wound completely heal and then go from there. Fortunately there's no signs of infection at this time.  12/07/18 on evaluation today patient fortunately did receive her brace and she states she could tell this definitely makes her walk better. With that being said she's been having issues with her toe where she noticed yesterday there was [name] lot of tissue that was loosing off this appears to be much larger than what it was previous. She also states that her leg has been read putting much across the top of her foot just about the ankle although this seems to be receiving KARIANA, WILES [name] ([id number]) [id number] Page 14 of 22 somewhat. The total area is still red and appears to be someone infected as best I can tell. She is previously taken Bactrim and that may be [name] good option for her today as well. We are gonna see what I wound culture shows as well and I think that this is definitely appropriate. With that being said outside of the culture I still need to initiate something in the interim and that's what I'[name] gonna go ahead and select Bactrim is [name] good option for her. 12/14/18 on evaluation today patient appears to be doing better in regard  to her left great toe ulcer as compared to last week's evaluation. There's still some erythema although this is significantly improved which is excellent news. Overall I do believe that she is making good progress is still gonna take some time before she is where I would like her to be from the standpoint of being able to place her back into the total contact cast. Hopefully we will be where we need to be by next week. 12/21/18 on evaluation today patient actually appears to be doing poorly in regard to her toe ulcer. She's been tolerating the dressing changes without complication. Fortunately there's no signs of systemic infection although she does have [name] lot of drainage from the toe ulcer and this does seem to be causing some issues at this point. She does have erythema on the distal portion of her toe that appears to be likely cellulitis. 12/28/18 on evaluation today patient actually appears to be doing [name] little better in my pinion in regard to her toe ulcer. With that being said she still does have some evidence of infection at this time and for her culture she had both E. coli as well as enterococcus as organisms noted on evaluation. For that reason I think that though the Keflex likely has treated the E. coli rather well this has really done nothing for the enterococcus. We are going to have to initiate treatment for this specifically. 01/04/19 on evaluation today patient's toe actually appears to be doing better from the standpoint of infection. She currently would like to see about putting the cash back on I think that this is appropriate as long as she takes care of it and keeps it from getting wet. She is gonna have some drainage we can definitely pass this up with Drawtex and alginate to try to prevent as much drainage as possible from causing the problems. With that being said I do want to at least try her with the cast between now and Tuesday. If there any issues we can't continue to use it  then I will discontinue the use of the cast at that point. 01/08/19 on evaluation today patient actually appears to be doing very well as far as her foot ulcer specifically the great toe on the left is concerned. She did have an area of rubbing on the medial aspect of her left ankle which again  is from the cast. Fortunately there's no signs of infection at this point in this appears to be [name] very slight skin breakdown. The patient tells me she felt it rubbing but didn't think it was that bad. Fortunately there is no signs of active infection at this time which is good news. No fevers, chills, nausea, or vomiting noted at this time. 01/15/19 on evaluation today patient actually appears to be doing well in regard to her toe ulcer. Again as previous she seems to do well and she has the cast on which indicates to me that during the time she doesn't have [name] cast on she's putting way too much pressure on this region. Obviously I think that's gonna be an issue as with the current national emergency concerning the Covid-19 Virus it has been recommended that we discontinue the use of total contact casting by the chief medical officer of our company, Dr. Maurine Minister. The reasoning is that if [name] patient becomes sick and cannot come into have the cast removed they could not just leave this on for an additional two weeks. Obviously the hospitals also do not want to receive patient's who are sick into the emergency department to potentially contaminate the region and spread the Covid-19 Virus among other sick individuals within the hospital system. Therefore at this point we are suspending the use of total contact cast until the current emergency subsides. This was all discussed with the patient today as well. 01/22/19 on evaluation today patient's wound on her left great toe appears to be doing slightly worse than previously noted last week. She tells me that she has been on this quite [name] bit in fact she tells me she's been  awake for 38 straight hours. This is due to the fact that she's having to care for grandparents because nobody else will. She has been taking care of them for five the last seven days since I've seen her they both have dementia his is from [name] stroke and her grandmother's was progressive. Nonetheless she states even her mom who knows her condition and situation has only help two of those days to take care of them she's been taking care of the rest. Fortunately there does not appear to be any signs of active infection in regard to her toe at this point although obviously it doesn't look as good as it did previous. I think this is directly related to her not taking off the pressure and friction by way of taking things easy. Though I completely understand what's going on. 01/29/19 on evaluation today patient's tools are actually appears to be showing some signs of improvement today compared to last week's evaluation as far as not necessarily the overall size of the wound but the fact that she has some new skin growth in between the two ends of the wound opening. Overall I feel like she has done well she states that she had [name] family member give her what sounds to be [name] CAM walker boot which has been helpful as well. 02/05/19 on evaluation today patient's wound bed actually appears to be doing significantly better in regard to her overall appearance of the size of the wound. With that being said she is still having an issue with offloading efficiently enough to get this to close. Apparently there is some signs of infection at this point as well unfortunately. Previously she's done well of Augmentin I really do not see anything that needs to be culture currently but there theme and cellulitis  of the foot that I'[name] seeing I'[name] gonna go ahead and place her on an antibiotic today to try to help clear this up. 02/12/2019 on evaluation today patient actually appears to be doing poorly in regard to her overall wound status.  She tells me she has been using her offloading shoe but actually comes in today wearing her tennis shoe with the AFO brace. Again as I previously discussed with her this is really not sufficient to allow the area to heal appropriately. Nonetheless she continues to be somewhat noncompliant and I do wonder based on what she has told my nurse in the past as to whether or not [name] good portion of this noncompliance may be recreational drug and alcohol related. She has had [name] history of heroin overdose and this was fairly recently in the past couple of months that have been seeing her. Nonetheless overall I feel like her wound looks significantly worse today compared to what it was previous. She still has significant erythema despite the Augmentin I am not sure that this is an appropriate medication for her infection I am also concerned that the infection may have gone down into her bone. 02/19/19 on evaluation today patient actually appears to be doing about the same in regard to her toe ulcer. Unfortunately she continues to show signs of bone exposure and infection at this point. There does not appear to be any evidence of worsening of the infection but I'[name] also not really sure that it's getting significantly better. She is on the Augmentin which should be sufficient for the Staphylococcus aureus infection that she has at this point. With that being said she may need IV antibiotics to more appropriately treat this. We did have [name] discussion today about hyperbaric option therapy. 02/28/19 on evaluation today patient actually appears to be doing much worse in regard to the wound on her left great toe as compared to even my previous evaluation last week. Unfortunately this seems to be training in [name] pretty poor direction. Her toe was actually now starting to angle laterally and I can actually see the entire joint area of the proximal portion of the digit where is the distal portion of the digit again is no longer even  in contact with the joint line. Unfortunately there's [name] lot more necrotic tissue around the edge and the toe appears to be showing signs of becoming gangrenous in my pinion. I'[name] very concerned about were things stand at this point. She did see infectious disease and they are planning to send in [name] prescription for Sivextro for her and apparently this has been approved. With that being said I don't think she should avoid taking this but at the same time I'[name] not sure that it's gonna be sufficient to save her toe at this point. She tells me that she still having to care for grandparents which I think is putting quite [name] bit of strain on her foot and specifically the total area and has caused this to break down even to [name] greater degree than would've otherwise been expected. 03/05/19 on evaluation today patient actually appears to be doing quite well in regard to her toe all things considering. She still has bone exposed but there appears to be much less your thing on overall the appearance of the wound and the toe itself is dramatically improved. She still does have some issues currently obviously with infection she did see vascular as well and there concerned that her blood flow to the toad. For  that reason they are setting up for an angiogram next week. 03/14/19 on evaluation today patient appears to be doing very poor in regard to her toe and specifically in regard to the ulceration and the fact that she's starting to notice the toe was leaning even more towards the lateral aspect and the complete joint is visible on the proximal aspect of the joint. Nonetheless she's also noted [name] significant odor and the tip of the toe is turning more dark and necrotic appearing. Overall I think she is getting worse not better as far as this is concerned. For that reason I am recommending at this point that she likely needs to be seen for likely amputation. READMISSION 03/19/2021 This is [name] patient that we cared for in  this clinic for [name] prolonged period of time in 2019 and 2020 with [name] left foot and left first toe wound. 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 Excell [name]. In December 2021 she had an ulcer on her right great toe as well as the fourth and fifth toes. 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 She also had an angiogram at that time and underwent angioplasty of the right anterior tibial artery. In any case she claims that the wound on the right [name], [name] ([id number]) [id number] Page 15 of 22 foot is closed I did not look at this today which was probably an oversight although I think that should be done next week. 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 According to Dr. Bernette Redbird last review that she was out of the area being cared for by another physician but recently came back to his attention. 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 [name] culture of this area showed E. coli apparently before she came back to see Dr. Excell [name] she was supposed to be receiving antibiotics but she did not really take them. Nor is she offloading this area at all. Finally her last hemoglobin A1c listed in epic was in March 2022 at 14.1 she says things are [name] lot better since then although I am not sure. 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 She had this wound at the time. During this admission she had E. coli sepsis [name] TEE was negative. Past medical history is extensive and includes type 2 diabetes with peripheral neuropathy cardiomyopathy with an ejection fraction of 33%, hypertension, hyperlipidemia chronic renal failure stage III history of substance abuse with cocaine although she claims to be clean  now verified by her mother. She is still [name] heavy cigarette smoker. She has [name] history of bipolar disorder seizure disorder ABI in our clinic was 1.05 6/1; left midfoot in the setting of [name] TMA done previously. Round circular wound with [name] "knuckle" of protruding tissue. The problem is that the knuckle was not attached to any of the surrounding granulation and this probed proximally widely I removed [name] large portion of this tissue. This wound goes with considerable undermining laterally. I do not feel any bone there was no purulence but this is [name] deep wound. 6/8; in spite of the debridement I did last week. She arrives with [name] wound looking exactly the same. [name] protruding "knuckle" of tissue nonadherent to most of the surrounding tissue. There  is considerable depth around this from 6-12 o'clock at 2.7 cm and undermining of 1 cm. This does not look overtly infected and the x- ray I did last week was negative for any osseous abnormalities. We have been using silver collagen 6/15; deep tissue culture I did last week showed moderate staph aureus and moderate Pseudomonas. This will definitely require prolonged antibiotic therapy. The pathology on the protuberant area was negative for malignancy fungus etc. 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 We have been using silver collagen. I am going to be prescribing Levaquin for 2 weeks. Her CT scan of the foot is down for 7/5 6/22; CT scan of the foot on 7 5. She says she has hardware in the left leg from her previous fracture. She is on the Levaquin for the deep tissue culture I did that showed methicillin sensitive staph aureus and Pseudomonas. I gave her [name] 2-week supply and she will have another week. She arrives in clinic today with the same protuberant tissue however this is nonadherent to the tissue surrounding it. I am really at [name] loss to explain this unless there is underlying deep  tissue infection 6/29; patient presents for 1 week follow-up. She has been using collagen to the wound bed. She reports taking her antibiotics as prescribed.She has no complaints or issues today. She denies signs of infection. 7/6; patient presents for one week followup. She has been using collagen to the wound bed. She states she is taking Levaquin however at times she is not able to keep it down. She denies signs of infection. 7/13; patient presents for 1 week follow-up. She has been using silver alginate to the wound bed. She still has nausea when taking her antibiotics. She denies signs of infection. 7/20; patient presents for 1 week follow-up. She has been using silver alginate with gentamicin cream to the wound bed. She denies any issues and has no complaints today. She denies signs of infection. 7/27; patient presents for 1 week follow-up. She continues to use silver alginate with gentamicin cream to the wound bed. She reports starting her antibiotics. She has no issues or complaints. Overall she reports stability to the wound. 8/3; patient presents for 1 week follow-up. She has been using silver alginate with gentamicin cream to the wound bed. She reports completing all antibiotics. She has no issues or complaints today. She denies signs of infection. 8/17; patient presents for 2-week follow-up. He is to use silver alginate to the wound bed. She has no issues or complaints today. She denies signs of infection. She reports her pain has improved in her foot since last clinic visit 8/24; patient presents for 1 week follow-up. She continues to use silver alginate to the wound bed. She has no issues or complaints. She denies signs of infection. Pain is stable. 9/7; patient presents for follow-up. She missed her last week appointment due to feeling ill. She continues to use silver alginate. She has [name] new wound to the right lower extremity that is covered in eschar. She states It occurred over  the past week and has no idea how it started. She currently denies signs of infection. 9/14; patient presents for follow-up. T the left foot wound she has been using gentamicin cream and silver alginate. T the right lower extremity wound she has o o been keeping this covered and has not obtain Santyl. 9/21; patient presents for follow-up. She reports using gentamicin cream and silver alginate to the  left foot and Santyl to the right lower extremity wound. She has no issues or complaints today. She denies signs of infection. 9/28; patient presents for follow-up. She reports [name] new wound to her right heel. She states this occurred [name] few days ago and is progressively gotten worse. She has been trying to clean the area with [name] Q-tip and Santyl. She reports stability in the other 2 wounds. She has been using gentamicin cream and silver alginate to the left foot and Santyl to the right lower extremity wound. 10/12; patient presents for follow-up. She reports improvement to the wound beds. She is seeing vein and vascular to discuss the potential of [name] left BKA. She states they are going to do an arteriogram. She continues to use silver alginate with dressing changes to her wounds. 11/2; patient presents for follow-up. She states she has not been doing dressing changes to the wound beds. She states she is not able to offload the areas. She reports chronic pain to her left foot wound. 11/9; patient presents for follow-up. She came in with only socks on. She states she forgot to put on shoes. It is unclear if she is doing any dressing changes. She currently denies systemic signs of infection. 11/16; patient presents for follow-up. She came again only with socks on. She states she does not wear shoes ever. It is unclear if she does dressing changes. She currently denies systemic signs of infection. 11/23; patient presents for follow-up. She wore her shoes today. It still unclear exactly what dressing she is  using for each wound but she did states she obtained Dakin's solution and has been using this to the left foot wound. She currently denies signs of infection. 11/30; patient presents for follow-up. She has no issues or complaints today. She currently denies signs of infection. 12/7; patient presents for follow-up. She has no issues or complaints today. She has been using Hydrofera Blue to the right heel wound and Dakin solution to the left foot wound. Her right anterior leg wound is healed. She currently denies signs of infection. 12/14; patient presents for follow-up. She has been using Hydrofera Blue to the right heel and Dakin's to the left foot wounds. She has no issues or complaints today. She denies signs of infection. 12/21; patient presents for follow-up. She reports using Hydrofera Blue to the right heel and Dakin's to the left foot wound. She denies signs of infection. 12/28; patient presents for follow-up. She continues to use Dakin's to the left foot wound and Hydrofera Blue to the right heel wound. She denies signs of infection. 1/4; patient presents for follow-up. She has no issues or complaints today. She denies signs of infection. 1/11; patient presents for follow-up. It is unclear if she has been dressing these wounds over the past week. She currently denies signs of infection. 1/18; patient presents for follow-up. She states she has been using Dakin's wet-to-dry dressings to the left foot. She has been using Hydrofera Blue to the right foot foot wound. She states that the anterior right leg wound has reopened and draining serous fluid. She denies signs of infection. 1/25; patient presents for follow-up. She has no issues or complaints today. [name], [name] ([id number]) [id number] Page 16 of 22 2/1; patient presents for follow-up. She has no issues or complaints today. She denies signs of infection. 2/8; patient presents for follow-up. She has lost her  surgical shoes. She did not have [name] dressing to the right heel wound. She currently denies signs  of infection. 2/15; patient presents for follow-up. She reports more pain to the right heel today. She denies purulent drainage Or fever/chills 2/22; patient presents for follow-up. She reports taking clindamycin over the past week. She states that she continues to have pain to her right heel. She reports purulent drainage. Readmission 03/16/2022 Ms. [name] Peet is [name] 47 year old female with [name] past medical history of type 2 diabetes, osteomyelitis to her feet, chronic systolic heart failure and bipolar disorder that presents to the clinic for bilateral feet wounds and right lower extremity wound. She was last seen in our clinic on 12/15/2021. At that time she had purulent drainage coming out of her right plantar foot and I recommended she go to the ED. She states she went to [facility] and has been there for the past 3 months. I cannot see the records. She states she had OR debridement and was on several weeks of IV antibiotics while inpatient. Since discharge she has not been taking care of the wound beds. She had nothing on her feet other than socks today. She currently denies signs of infection. 5/31; patient presents for follow-up. She has been using Dakin's wet-to-dry dressings to the wound beds on her feet bilaterally and antibiotic ointment to the right anterior leg wound. She had [name] wound culture done at last clinic visit that showed moderate Pseudomonas aeruginosa sensitive to ciprofloxacin. She currently denies systemic signs of infection. 6/14; patient presents for follow-up. She received Keystone 5 days ago and has been using this on the wound beds. She states that last week she had to go to the hospital because she had increased warmth and erythema to the right foot. She was started on 2 oral antibiotics. She states she has been taking these. She currently denies systemic signs of  infection. She has no issues or complaints today. 6/21; patient presents for follow-up. She states she has been using Keystone antibiotics to the wound beds. She has no issues or complaints today. She denies signs of infection. 6/28; patient presents for follow-up. She has been using Keystone antibiotics to the wound beds. She has no issues or complaints today. 7/12; patient presents for follow-up. Has been using Keystone antibiotics to the wound beds with calcium alginate. She has no issues or complaints today. She never followed up with her orthopedic surgeon who did the OR debridement to the right foot. We discussed the total contact cast for the left foot and patient would like to do this next week. 7/19; patient presents for follow-up. She has been using Keystone antibiotics with calcium alginate to the wound beds. She has no issues or complaints today. Patient is in agreement to do the total contact cast of the left foot today. She knows to return later this week for the obligatory cast change. 05-13-2022 upon evaluation today patient's wound which she has the cast of the left leg actually appears to be doing significantly better. Fortunately I do not see any signs of active infection locally or systemically which is great news and overall I am extremely pleased with where we stand currently. 7/26; patient presents for follow-up. She has [name] cast in place for the past week. She states it irritated her shin. Other than that she tolerated the cast well. She states she would like [name] break for 1 week from the cast. We have been using Keystone antibiotic and Aquacel to both wound beds. She denies signs of infection. 8/2; patient presents for follow-up. She has been using [place] and  Aquacel to the wound beds. She denies any issues and has no complaints. She is agreeable to have the cast placed today for the left leg. 06-03-2022 upon evaluation today patient appears to be doing well with regard to her  wound she saw some signs of improvement which is great news. Fortunately I do not see any evidence of active infection locally or systemically at this time which is great news. No fevers, chills, nausea, vomiting, or diarrhea. 8/16; patient presents for follow-up. She has no issues or complaints today. We have been using Keystone and Aquacel to the wound beds. The left lower extremity is in [name] total contact cast. She is tolerated this well. 8/23; patient presents for follow-up. She has had the total contact cast on the left leg for the past week. Unfortunately this has rubbed and broken down the skin to the medial foot. She currently denies signs of infection. She has been using Keystone antibiotic to the right plantar foot wound. 8/30; patient presents for follow-up. We have held off on the total contact cast for the left leg for the past week. Her wound on the left foot has improved and the previous surrounding breakdown of skin has epithelialized. She has been using Keystone antibiotic to both wound beds. She has no issues or complaints today. She denies signs of infection. 9/6; patient presents for follow-up. She has ordered her's Keystone antibiotic and this is arriving this week. She has been doing Dakin's wet-to-dry dressings to the wound beds. She denies signs of infection. She is agreeable to the total contact cast today. 9/13; patient presents for follow-up. She states that the cast caused her left leg shin to be sore. She would like to take [name] break from the cast this week. She has been using Keystone antibiotic to the right plantar foot wound. She denies signs of infection. 9/20; patient presents for follow-up. She has been using Keystone antibiotics to the wound beds with calcium alginate to the right foot wound and Hydrofera Blue to the left foot wound. She is agreeable to having the cast placed today. She has been approved for Apligraf and we will order this for next clinic visit. 9/27;  patient presents for follow-up. We have been using Keystone antibiotic with Hydrofera Blue to the left foot wound under [name] total contact cast. T the right o foot wound she has been using Keystone antibiotic and calcium alginate. She declines [name] total contact cast today. Apligraf is available for placement and she would like to proceed with this. 07-28-2022 upon evaluation today patient appears to be doing well currently in regard to her wound. She is actually showing signs of significant improvement which is great news. Fortunately I do not see any evidence of active infection locally nor systemically at this time. She has been seeing Dr. Mikey Bussing and to be honest has been doing very well with the cast. Subsequently she comes in today with [name] cast on and we did reapply that today as well. She did not really want to she try to talk me out of that but I explained that if she wanted to heal this is really the right way to go. Patient voiced understanding. In regard to her right foot this is actually [name] lot better compared to the last time I saw her which is also great news. 10/11; patient presents for follow-up. Apligraf and the total contact cast was placed to the left leg at last clinic visit. She states that her right foot wound had  burning pain to it with the placement of Apligraf to this area. She has been doing [employer] over this area. She denies signs of infection including increased warmth, erythema or purulent drainage. 11/1; 3-week follow-up. The patient fortunately did not have [name] total contact cast or an Apligraf and on the left foot. She has been using Keystone ABD pads and kerlix and her own running shoes She arrives in clinic today with thick callus and [name] very poor surface on the left foot on the right nonviable skin subcutaneous tissue and [name] deep probing hole. 11/15; patient missed her last clinic appointment. She states she has not been dressing the wound beds for the past 2 weeks. She states  that at she had [name] new roommate but is now going back to live with her mother. Apparently its been [name] distracting 2 weeks. Patient currently denies signs of infection. GURLEEN, LARRIVEE [name] ([id number]) [id number] Page 17 of 22 11/22; patient presents for follow-up. She states she has been using Keystone antibiotic and Dakin's wet-to-dry dressings to the wound beds. She is agreeable for cast placement today. We had ordered Apligraf however this has not been received by our facility. 11/29; Patient had [name] total contact cast placed at last clinic visit and she tolerated this well. We were using silver alginate under the cast. Patient's been using Keystone antibiotic with Aquacel to the right plantar foot wound. She has no issues or complaints today. Apligraf is available for placement today. Patient would like to proceed with this. 12/6; patient presents for follow-up. She had Apligraf placed in standard fashion last clinic visit under the total contact cast to the left lower extremity. She has been using Keystone antibiotic and Aquacel to the right plantar foot wound. She has no issues or complaints today. 12/13; patient presents for follow-up. She has finished 5 Apligraf placements. Was told she would not qualify for more. We have been doing [name] total contact cast to the left lower extremity. She has been using Keystone antibiotic and Aquacel to the right plantar foot wound. She has no issues or complaints today. 12/20; patient presents for follow-up. We have been using Hydrofera Blue with Keystone antibiotic under [name] total contact cast of the left lower extremity. She reports using Keystone antibiotic and silver alginate to the right heel wound. She has no issues or complaints today. 12/27; patient presents with [name] healthy wound on the left midfoot. We have Apligraf to apply that to that more also using [name] total contact cast. On the right we are using Keystone and silver alginate. She  is offloading the right heel with [name] surgical shoealthough by her admission she is on her feet quite [name] bit 1/3; patient presents for follow-up. Apligraf was placed to the wound beds last clinic visit. She was placed in [name] total contact cast to the left lower extremity. She declines [name] total contact cast today. She states that her mother is in the hospital and she cannot adequately get around with the cast on. 1/10; patient presents for follow-up. She declined the total contact cast at last clinic visit. Both wounds have declined in appearance. She states that she has been on her feet and not offloading the wound beds. She currently denies signs of infection. 1/17; patient presents for follow-up. She had the total contact cast along with Apligraf placed last week to the left lower extremity. She tolerated this well. She has been using Aquacel Ag and Keystone antibiotic to the right heel wound. She currently denies signs  of infection. She has no issues or complaints today. 1/24; patient presents for follow-up. We have been using Apligraf to the left foot wound along with [name] total contact cast. She has done well with this. T the right o heel wound she has been using Aquacel Ag and Keystone antibiotic ointment. She has no issues or complaints today. She denies signs of infection. 1/31; patient presents for follow-up. We have been using Apligraf to the left foot wound along with the total contact cast. She continues to do well with this. To the right heel we have been using Aquacel Ag and Keystone antibiotic ointment. She has no issues or complaints today. 12-01-2022 upon evaluation patient is seen today on my schedule due to the fact that she unfortunately was in the hospital yesterday. Her cast needed to come off the only reason she is out of the hospital is due to the fact that they would not take it off at the hospital which is somewhat bewildered reading to me to be perfectly honest. I am not certain why  this was but either way she was released and then was placed on my schedule today in order to get this off and reapply the total contact casting as appropriate. I do not have an Apligraf for her today it was applied last week and today's actually expired yesterday as there was some scheduling conflicts with her being in the hospital. Nonetheless we do not have that for reapplication today but the good news that she is not draining too much and the Apligraf can go for up to 2 weeks so I am going to go ahead and reapply the total contact casting but we are going to leave the Apligraf in place. 2/14; patient presents for follow-up. T the left leg she has had the total contact cast and Apligraf for the past week. She has had no issues with this. T the o o right heel she has been using antibiotic ointment and Aquacel Ag. 2/21; patient presents for follow-up. We have been using Apligraf and [name] total contact cast to the left lower extremity. She is tolerated this well. Unfortunately she is not approved for any more Apligraf per insurance. She has been using antibiotic ointment and Aquacel Ag to the right foot. She has no issues or complaints today. 2/28; patient presents for follow-up. We have been using Hydrofera Blue and antibiotic ointment under the total contact cast to the left lower extremity. He has been using Aquacel Ag with antibiotic ointment to the right plantar foot. She has no issues or complaints today. 3/6; patient presents for follow-up. She did not obtain her gentamicin ointment. She has been using Aquacel Ag to the right plantar foot wound. We have been using Hydrofera Blue with antibiotic ointment under the total contact cast to the left lower extremity. She has no issues or complaints today. 3/12; patient presents for follow-up. She has been using gentamicin ointment and Aquacel Ag to the right plantar foot wound. We have been using Hydrofera Blue with antibiotic ointment under the total  contact cast on the left lower extremity foot wound. She has no issues or complaints today. 3/20; patient presents for follow-up. She has been using gentamicin ointment and Aquacel Ag to the right plantar foot wound. We have been using Hydrofera Blue with antibiotic ointment under the total contact cast to the left lower extremity wound. She has no issues or complaints today. 3/27; patient presents for follow-up. We have been using antibiotic ointment and Aquacel  Ag to the right plantar foot wound. We have been using Hydrofera Blue with antibiotic ointment under the total contact cast to the left lower extremity. Both wounds are smaller. 01-24-2023 upon evaluation today patient actually appears to be doing better compared to last time I saw her. It has been several weeks since I last saw her but nonetheless I think the total contact casting is doing [name] good job. Fortunately I do not see any evidence of infection or worsening overall which is great news and in general I do believe that she is moving in the appropriate direction. It has been [name] very long road but nonetheless I feel like she is nearing the end at least in regard to the left foot and even the right foot looks much better to be perfectly honest. 01-31-2023 upon evaluation today patient appears to be doing well currently in regard to her wounds. She has been require some sharp debridement which I think is probably bone both sides to remove [name] lot of callus that has built up at this point. I discussed that with her today. Also think total contact cast is doing well for the left foot Emina continue that as well. 4/17; patient presents for follow-up. We have been doing Hydrofera Blue with antibiotic ointment under the total contact cast to the left lower extremity and Aquacel Ag with antibiotic ointment to the right foot wound. She has been using her offloading heel shoe here. 4/24; patient presents for follow-up. We have been doing Hydrofera Blue  with antibiotic ointment under the total contact cast to the left lower extremity and Aquacel Ag with antibiotic ointment to the right foot wound. She has been using her offloading heel shoe here. Wounds are smaller with slough accumulation. 5/1; patient presents for follow-up. We have been doing Hydrofera Blue with antibiotic ointment under the total contact cast to the left lower extremity and Aquacel Ag with antibiotic ointment to the right foot wound. Left foot wound is stable and right foot wound appears smaller. 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 On the left she is using Hydrofera Blue topical antibiotic under [name] total contact cast, on the right plantar heel gentamicin Aquacel Ag. According to our intake nurse both are making slow but steady improvements. 5/15; patient presents for follow-up. We have been doing Hydrofera Blue with antibiotic ointment under the total contact cast on the left and antibiotic ointment with Aquacel Ag on the right. Unfortunately the left foot wound has declined in size and appearance. We also do not have the total contact cast available in office today. Patient denies signs of infection. 5/22; patient presents for follow-up. We have been doing Hydrofera Blue and antibiotic ointment to the left foot and Aquacel Ag with antibiotic ointment to the right foot. We took [name] break from the cast since we did not have it placed at last week. Wounds look stable if not slightly improved. [name], FORMOSA [name] ([id number]) [id number] Page 78 of 22 03-27-2023 patient presents today she has had this For Most 2 Weeks Because She Was in the Hospital and They Did Not T It off. Again That Is Really Not ake Something That She Could Control but Nonetheless We Are Seeing Her at This Point Today for Reevaluation. We Do Want to Go and See about Reapply the Cast. Again We Had All Set Everything up to Go Ahead and Do This When We Went to Get the Cast Only to  Realize That Not Had Come in and We Were  out at the Moment. With That Being [place] Spaniel We Will Get Have T Get the Exam before We Can Reapply the T Contact Casting Going Forward. o otal 6/12; patient presents for follow-up. She has been using Hydrofera Blue to the left foot wound and Aquacel Ag to the right foot wound. No cast was available last week placed on the patient so she has been offloading with her surgical shoe. She has no issues or complaints today. 6/19; patient presents for follow-up. We have been using Hydrofera Blue to the left foot wound under the total contact cast and Aquacel Ag to the right wound. She has no issues or complaints today. The wounds are smaller. 6/26; patient presents for follow-up. We have been using Hydrofera Blue to the left foot wound under the total contact cast and Aquacel Ag to the right foot wound. Wounds are smaller. Patient has no issues or complaints today. 7/3; patient presents for follow-up. We have been using Hydrofera Blue to the left foot wound under the total contact cast and Aquacel Ag to the right foot wound. Wounds Appear smaller. No signs of infection. 7/10; patient presents for follow-up. We have been using Hydrofera Blue to the left foot wound under the total contact cast and Aquacel Ag to the right foot wound. Left foot wound is smaller. Right foot wound is stable. Objective Constitutional Vitals Time Taken: 11:19 AM, Height: 69 in, Weight: 178 lbs, BMI: 26.3, Temperature: 98.5 F, Pulse: 58 bpm, Respiratory Rate: 16 breaths/min, Blood Pressure: 130/69 mmHg. General Notes: Right foot: T the plantar heel there is an incision site with increased depth, Granulation tissue at the opening. Left foot: T the medial aspect o o there is an open wound with granulation tissue, Callus and slough build up. No overt signs of infection to any of the wound beds. Integumentary (Hair, Skin) Wound #11 status is Open. Original cause of wound was Surgical Injury. The  date acquired was: 12/01/2021. The wound has been in treatment 59 weeks. The wound is located on the Right,Plantar Foot. The wound measures 0.3cm length x 0.3cm width x 0.4cm depth; 0.071cm^2 area and 0.028cm^3 volume. There is Fat Layer (Subcutaneous Tissue) exposed. There is [name] small amount of serous drainage noted. The wound margin is distinct with the outline attached to the wound base. There is small (1-33%) pink, pale granulation within the wound bed. There is [name] large (67-100%) amount of necrotic tissue within the wound bed including Adherent Slough. Wound #12 status is Open. Original cause of wound was Pressure Injury. The date acquired was: 03/16/2020. The wound has been in treatment 59 weeks. The wound is located on the Left,Medial,Plantar Foot. The wound measures 0.3cm length x 0.3cm width x 0.1cm depth; 0.071cm^2 area and 0.007cm^3 volume. There is Fat Layer (Subcutaneous Tissue) exposed. There is [name] medium amount of serosanguineous drainage noted. The wound margin is flat and intact. There is large (67-100%) pink, pale granulation within the wound bed. There is [name] small (1-33%) amount of necrotic tissue within the wound bed including Adherent Slough. 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 I debrided nonviable tissue and recommended continue with  Hydrofera Blue under the total contact cast. The right foot wound is stable however also appears well-healing. I recommended continuing Aquacel Ag here. Follow-up in 1 week. Procedures Wound #12 Pre-procedure diagnosis of Wound #12 is [name]  Diabetic Wound/Ulcer of the Lower Extremity located on the Left,Medial,Plantar Foot .Severity of Tissue Pre Debridement is: Fat layer exposed. There was [name] Selective/Open Wound Non-Viable Tissue Debridement with [name] total area of 0.07 sq cm performed by Heather Corwin, MD. With the following instrument(s): Curette to remove Viable and Non-Viable tissue/material. Material removed includes Callus and Slough and. [name] time out was conducted at 11:38, prior to the start of the procedure. [name] Minimum amount of bleeding was controlled with Pressure. The procedure was tolerated well. Post Debridement Measurements: 0.3cm length x 0.3cm width x 0.1cm depth; 0.007cm^3 volume. Character of Wound/Ulcer Post Debridement is improved. Severity of Tissue Post Debridement is: Fat layer exposed. Post procedure Diagnosis Wound #12: Same as Pre-Procedure [name], [name] ([id number]) [id number] Page 19 of 22 Pre-procedure diagnosis of Wound #12 is [name] Diabetic Wound/Ulcer of the Lower Extremity located on the Left,Medial,Plantar Foot . There was [name] T Contact otal Cast Procedure by Heather Corwin, MD. Post procedure Diagnosis Wound #12: Same as Pre-Procedure Plan Follow-up Appointments: Return Appointment in 1 week. Nurse Visit as needed Bathing/ Shower/ Hygiene: May shower with wound dressing protected with water repellent cover or cast protector. No tub bath. Anesthetic (Use 'Patient Medications' Section for Anesthetic Order Entry): Lidocaine applied to wound bed Edema Control - Lymphedema / Segmental Compressive Device / Other: Elevate, Exercise Daily and Avoid Standing for Long Periods of Time. Elevate legs to the level of the heart and pump ankles as often as possible Elevate leg(s) parallel to the floor when sitting. Off-Loading: T Contact Cast to Left Lower Extremity - tcc applied left leg otal Open toe surgical shoe - Heel off loader - Right foot- Other: - keep pressure off of  feet. Additional Orders / Instructions: Follow Nutritious Diet and Increase Protein Intake Other: - Stay off of foot, wheelchair only WOUND #11: - Foot Wound Laterality: Plantar, Right Cleanser: Wound Cleanser 3 x Per Week/30 Days Discharge Instructions: Wash your hands with soap and water. Remove old dressing, discard into plastic bag and place into trash. Cleanse the wound with Wound Cleanser prior to applying [name] clean dressing using gauze sponges, not tissues or cotton balls. Do not scrub or use excessive force. Pat dry using gauze sponges, not tissue or cotton balls. Prim Dressing: Aquacel Extra Hydrofiber Dressing, 2x2 (in/in) 3 x Per Week/30 Days ary Secondary Dressing: ABD Pad 5x9 (in/in) (Generic) 3 x Per Week/30 Days Discharge Instructions: Cover with ABD pad Secured With: Medipore T - 23M Medipore H Soft Cloth Surgical T ape ape, 2x2 (in/yd) 3 x Per Week/30 Days Secured With: [employer] or Non-Sterile 6-ply 4.5x4 (yd/yd) 3 x Per Week/30 Days Discharge Instructions: Apply Kerlix as directed WOUND #12: - Foot Wound Laterality: Plantar, Left, Medial Cleanser: Wound Cleanser 1 x Per Week/30 Days Discharge Instructions: Wash your hands with soap and water. Remove old dressing, discard into plastic bag and place into trash. Cleanse the wound with Wound Cleanser prior to applying [name] clean dressing using gauze sponges, not tissues or cotton balls. Do not scrub or use excessive force. Pat dry using gauze sponges, not tissue or cotton balls. Prim Dressing: Hydrofera Blue Ready Transfer Foam, 2.5x2.5 (in/in) 1 x Per Week/30 Days ary Discharge Instructions: Apply Hydrofera Blue Ready to wound bed as directed Secondary Dressing:  heel cup 1 x Per Week/30 Days Discharge Instructions: x2 for heel and front of foot Secured With: Medipore T - 13M Medipore H Soft Cloth Surgical T ape ape, 2x2 (in/yd) 1 x Per Week/30 Days Secured With: [employer] Sterile or Non-Sterile 6-ply 4.5x4 (yd/yd) 1  x Per Week/30 Days Discharge Instructions: Apply Kerlix as directed 1. In office sharp debridement 2. T contact cast placed in standard fashion otal 3. Aquacel Ag 4. Hydrofera Blue 5. Follow-up in 1 week Electronic Signature(s) Signed: 05/03/2023 12:00:03 PM By: Heather Corwin DO Entered By: [name] on 05/03/2023 11:58:23 -------------------------------------------------------------------------------- ROS/PFSH Details Patient Name: Date of Service: [name], Jaicey [name]. 05/03/2023 11:00 [name] Medical Record Number: [id number] Patient Account Number: [id number] PATRA, GHERARDI ([id number]) [id number] Page 20 of 22 Date of Birth/Sex: Treating RN: [birth year] (47 y.o. [name] Primary Care Provider: Other Clinician: Debby Freiberg Referring Provider: Treating Provider/Extender: [name] in Treatment: 75 Information Obtained From Patient Ear/Nose/Mouth/Throat Medical History: Positive for: Chronic sinus problems/congestion; Middle ear problems Hematologic/Lymphatic Medical History: Positive for: Anemia Respiratory Medical History: Positive for: Chronic Obstructive Pulmonary Disease (COPD) Cardiovascular Medical History: Positive for: Congestive Heart Failure - EF 33% Endocrine Medical History: Positive for: Type II Diabetes Time with diabetes: 14 years Treated with: Insulin, Oral agents Blood sugar tested every day: No Blood sugar testing results: Bedtime: 176 Genitourinary Medical History: Positive for: End Stage Renal Disease Integumentary (Skin) Medical History: Positive for: History of pressure wounds Neurologic Medical History: Positive for: Neuropathy - feel and legs HBO Extended History Items Ear/Nose/Mouth/Throat: Ear/Nose/Mouth/Throat: Chronic sinus Middle ear problems problems/congestion Immunizations Pneumococcal Vaccine: Received Pneumococcal Vaccination:  No Implantable Devices No devices added Family and Social History Current every day smoker; Marital Status - Single; Alcohol Use: Never; Drug Use: Prior History; Caffeine Use: Daily Electronic Signature(s) Signed: 05/03/2023 12:00:03 PM By: Heather Corwin DO Signed: 05/16/2023 8:06:11 AM By: [name], BSN, RN, CWS, [name] RN, BSN Entered By: [name] on 05/03/2023 11:59:41 ADIN, LARICCIA [name] ([id number]) [id number] Page 21 of 22 -------------------------------------------------------------------------------- Total Contact Cast Details Patient Name: Date of Service: ARENA, LINDAHL 05/03/2023 11:00 [name] Medical Record Number: [id number] Patient Account Number: [id number] Date of Birth/Sex: Treating RN: 1976-08-26 (47 y.o. [name] Primary Care Provider: Lindwood [name] Other Clinician: Betha [name] Referring Provider: Treating Provider/Extender: [name] in Treatment: 39 T Contact Cast Applied for Wound Assessment: otal Wound #12 Left,Medial,Plantar Foot Performed By: Physician Heather Corwin, MD Post Procedure Diagnosis Same as Pre-procedure Electronic Signature(s) Signed: 05/03/2023 12:00:03 PM By: Heather Corwin DO Signed: 05/08/2023 4:35:54 PM By: [name] Entered By: [name] on 05/03/2023 11:37:37 -------------------------------------------------------------------------------- SuperBill Details Patient Name: Date of Service: [name] 05/03/2023 Medical Record Number: [id number] Patient Account Number: [id number] Date of Birth/Sex: Treating RN: 15-Jan-1976 (47 y.o. Cathlean Cower, [name] Primary Care Provider: Lindwood [name] Other Clinician: Betha [name] Referring Provider: Treating Provider/Extender: [name] in Treatment: 59 Diagnosis Coding ICD-10 Codes Code Description [phone] Non-pressure chronic ulcer of other part of left foot with other specified  severity 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.621 Type 2 diabetes mellitus with foot ulcer E11.42 Type 2 diabetes mellitus with diabetic polyneuropathy L97.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 of skin Z79.4 Long term (current) use of insulin Facility Procedures : AINSLEY, DEAKINS Code: [id number] RISTI [name] ([id number] Description: [phone] - APPLY  TOTAL CONTACT LEG CAST ICD-10 Diagnosis Description 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 E11.621 Type 2 diabetes mellitus with foot ulcer 02) [phone] Modifier: ity _Physician_218 Quantity: 1 17.pdf Page 22 of 22 Physician Procedures : CPT4 Code Description Modifier [id number] [phone] - WC PHYS LEVEL 3 - EST PT 25 ICD-10 Diagnosis Description 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.621 Type 2 diabetes mellitus with foot ulcer Quantity: 1 : [phone] [id number] - WC PHYS APPLY TOTAL CONTACT CAST ICD-10 Diagnosis Description 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ity E11.621 Type 2 diabetes mellitus with foot ulcer Quantity: 1 Electronic Signature(s) Signed: 05/03/2023 12:00:03 PM By: Heather Corwin DO Entered By: [name] on 05/03/2023 11:59:17

## 2023-05-16 NOTE — Progress Notes (Signed)
[name] ([id number]) [id number] Page 1 of 10 Visit Report for 05/03/2023 Arrival Information Details Patient Name: Date of Service: [name], [name] 05/03/2023 11:00 [name]: [id number] Patient Account Number: [id number] Date of Birth/Sex: Treating RN: [birth year] (47 y.o. Skip Mayer Primary Care Gagandeep Pettet: [name] Other Clinician: [name] Referring Jalaila Caradonna: Treating Tiffanni Scarfo/Extender: Chapman Moss in Treatment: 13 Visit Information History Since Last Visit All ordered tests and consults were completed: No Patient Arrived: Wheel Chair Added or deleted any medications: No Arrival Time: 11:18 Any new allergies or adverse reactions: No Transfer Assistance: None Had [name] fall or experienced change in No Patient Identification Verified: Yes activities of daily living that may affect Secondary Verification Process Completed: Yes risk of falls: Patient Requires Transmission-Based Precautions: No Signs or symptoms of abuse/neglect since No Patient Has Alerts: No last visito Hospitalized since last visit: No Implantable device outside of the clinic No excluding cellular tissue based products placed in the center since last visit: Has Dressing in Place as Prescribed: Yes Has Compression in Place as Prescribed: Yes Has Footwear/Offloading in Place as Yes Prescribed: Left: T Contact Cast otal Right: Surgical Shoe with Pressure Relief Insole Pain Present Now: No Electronic Signature(s) Signed: 05/08/2023 4:35:54 PM By: [name] Entered By: [name] on 05/03/2023 11:19:29 -------------------------------------------------------------------------------- Clinic Level of Care Assessment Details Patient Name: Date of Service: [name], [name] 05/03/2023 11:00 [name]: [id number] Patient Account Number: [id number] Date of Birth/Sex: Treating RN: 1976-09-23 (47 y.o. Skip Mayer Primary Care Enora Trillo: [name] Other Clinician: [name] Referring Emerald Gehres: Treating Markevion Lattin/Extender: Chapman Moss in Treatment: 76 Clinic Level of Care Assessment Items [name] ([id number]) [id number] Page 2 of 10 TOOL 1 Quantity Score []  - 0 Use when EandM and Procedure is performed on INITIAL visit ASSESSMENTS - Nursing Assessment / Reassessment []  - 0 General Physical Exam (combine w/ comprehensive assessment (listed just below) when performed on new pt. evals) []  - 0 Comprehensive Assessment (HX, ROS, Risk Assessments, Wounds Hx, etc.) ASSESSMENTS - Wound and Skin Assessment / Reassessment []  - 0 Dermatologic / Skin Assessment (not related to wound area) ASSESSMENTS - Ostomy and/or Continence Assessment and Care []  - 0 Incontinence Assessment and Management []  - 0 Ostomy Care Assessment and Management (repouching, etc.) PROCESS - Coordination of Care []  - 0 Simple Patient / Family Education for ongoing care []  - 0 Complex (extensive) Patient / Family Education for ongoing care []  - 0 Staff obtains Chiropractor, Records, T Results / Process Orders est []  - 0 Staff telephones HHA, Nursing Homes / Clarify orders / etc []  - 0 Routine Transfer to another Facility (non-emergent condition) []  - 0 Routine Hospital Admission (non-emergent condition) []  - 0 New Admissions / Manufacturing engineer / Ordering NPWT Apligraf, etc. , []  - 0 Emergency Hospital Admission (emergent condition) PROCESS - Special Needs []  - 0 Pediatric / Minor Patient Management []  - 0 Isolation Patient Management []  - 0 Hearing / Language / Visual special needs []  - 0 Assessment of Community assistance (transportation, D/C planning, etc.) []  - 0 Additional assistance / Altered mentation []  - 0 Support Surface(s) Assessment (bed, cushion, seat, etc.) INTERVENTIONS - Miscellaneous []  - 0 External ear  exam []  - 0 Patient Transfer (multiple staff / Nurse, adult / Similar devices) []  - 0 Simple Staple / Suture removal (25 or less) []  - 0 Complex Staple / Suture removal (26 or more) []  -  0 Hypo/Hyperglycemic Management (do not check if billed separately) []  - 0 Ankle / Brachial Index (ABI) - do not check if billed separately Has the patient been seen at the hospital within the last three years: Yes Total Score: 0 Level Of Care: ____ Electronic Signature(s) Signed: 05/08/2023 4:35:54 PM By: [name] Entered By: [name] on 05/03/2023 11:40:48 [name] ([id number]) [id number] Page 3 of 10 -------------------------------------------------------------------------------- Encounter Discharge Information Details Patient Name: Date of Service: [name], [name] 05/03/2023 11:00 [name]: [id number] Patient Account Number: [id number] Date of Birth/Sex: Treating RN: September 22, 1976 (47 y.o. Skip Mayer Primary Care Kysa Calais: [name] Other Clinician: [name] Referring Paytin Ramakrishnan: Treating Elma Limas/Extender: Chapman Moss in Treatment: 16 Encounter Discharge Information Items Post Procedure Vitals Discharge Condition: Stable Temperature (F): 98.5 Ambulatory Status: Wheelchair Pulse (bpm): 58 Discharge Destination: Home Respiratory Rate (breaths/min): 16 Transportation: Private Auto Blood Pressure (mmHg): 130/69 Accompanied By: mother Schedule Follow-up Appointment: Yes Clinical Summary of Care: Electronic Signature(s) Signed: 05/08/2023 4:35:54 PM By: [name] Entered By: [name] on 05/03/2023 12:19:15 -------------------------------------------------------------------------------- Lower Extremity Assessment Details Patient Name: Date of Service: [name], [name] 05/03/2023 11:00 [name]: [id number] Patient Account Number: [id number] Date of Birth/Sex:  Treating RN: [birth year] (47 y.o. Skip Mayer Primary Care Tegh Franek: [name] Other Clinician: [name] Referring Mahitha Hickling: Treating Randeep Biondolillo/Extender: [name] Weeks in Treatment: 59 Edema Assessment Assessed: [Left: No] [Right: No] Edema: [Left: N] [Right: o] Calf Left: Right: Point of Measurement: 31 cm From Medial Instep Ankle Left: Right: Point of Measurement: 10 cm From Medial Instep Vascular Assessment Pulses: Dorsalis Pedis Palpable: [Left:Yes] [Right:Yes] Electronic Signature(s) Signed: 05/08/2023 4:35:54 PM By: [name] Signed: 05/16/2023 8:06:11 AM By: Elliot Gurney, BSN, RN, CWS, [name] RN, BSN Entered By: [name] on 05/03/2023 11:35:25 [name] ([id number]) [id number] Page 4 of 10 -------------------------------------------------------------------------------- Multi Wound Chart Details Patient Name: Date of Service: [name], [name] 05/03/2023 11:00 [name]: [id number] Patient Account Number: [id number] Date of Birth/Sex: Treating RN: [birth year] (47 y.o. Cathlean Cower, [name] Primary Care Jamiesha Victoria: [name] Other Clinician: [name] Referring Edna Rede: Treating Deyon Chizek/Extender: Chapman Moss in Treatment: 31 Vital Signs Height(in): 69 Pulse(bpm): 58 Weight(lbs): 178 Blood Pressure(mmHg): 130/69 Body Mass Index(BMI): 26.3 Temperature(F): 98.5 Respiratory Rate(breaths/min): 16 [11:Photos:] [N/[name]:N/[name]] Right, Plantar Foot Left, Medial, Plantar Foot N/[name] Wound Location: Surgical Injury Pressure Injury N/[name] Wounding Event: Open Surgical Wound 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: Extremity Chronic sinus problems/congestion, Chronic sinus problems/congestion, N/[name] Comorbid History: Middle ear problems, Anemia, Chronic Middle ear problems, Anemia, Chronic Obstructive Pulmonary Disease Obstructive Pulmonary Disease (COPD),  Congestive Heart Failure, (COPD), Congestive Heart Failure, Type II Diabetes, End Stage Renal Type II Diabetes, End Stage Renal Disease, History of pressure wounds, Disease, History of pressure wounds, Neuropathy Neuropathy 12/01/2021 03/16/2020 N/[name] Date Acquired: 1 59 N/[name] Weeks of Treatment: Open Open N/[name] Wound Status: No No N/[name] Wound Recurrence: 0.3x0.3x0.4 0.3x0.3x0.1 N/[name] Measurements L x W x D (cm) 0.071 0.071 N/[name] [name] (cm) : rea 0.028 0.007 N/[name] Volume (cm) : 96.20% 98.50% N/[name] % Reduction in Area: 98.30% 99.30% N/[name] % Reduction in Volume: Full Thickness Without Exposed Grade 3 N/[name] Classification: Support Structures Small Medium N/[name] Exudate Amount: Serous Serosanguineous N/[name] Exudate Type: amber red, brown N/[name] Exudate Color: Distinct, outline attached Flat and Intact N/[name] Wound Margin: Small (1-33%) Large (67-100%) N/[name] Granulation Amount: Pink, Pale Pink, Pale N/[name] Granulation Quality:  Large (67-100%) Small (1-33%) N/[name] Necrotic Amount: Fat Layer (Subcutaneous Tissue): Yes Fat Layer (Subcutaneous Tissue): Yes N/[name] Exposed Structures: Fascia: No Fascia: No Tendon: No Tendon: No Muscle: No Muscle: No Joint: No Joint: No Bone: No Bone: No Medium (34-66%) None N/[name] Epithelialization: Treatment Notes JAMEIA, MAKRIS [name] ([id number]) [id number] Page 5 of 10 Electronic Signature(s) Signed: 05/08/2023 4:35:54 PM By: [name] Entered By: [name] on 05/03/2023 11:35:30 -------------------------------------------------------------------------------- Multi-Disciplinary Care Plan Details Patient Name: Date of Service: Heather Jefferson [name]. 05/03/2023 11:00 [name]: [id number] Patient Account Number: [id number] Date of Birth/Sex: Treating RN: April 26, 1976 (47 y.o. Cathlean Cower, [name] Primary Care Ellysa Parrack: [name] Other Clinician: [name] Referring Salif Tay: Treating Skeeter Sheard/Extender: Chapman Moss in Treatment: 65 Active Inactive Abuse / Safety / Falls / Self Care Management Nursing Diagnoses: History of Falls Potential for falls Potential for injury related to falls Goals: Patient will not develop complications from immobility Date Initiated: 03/17/2022 Date Inactivated: 04/20/2022 Target Resolution Date: 03/16/2022 Goal Status: Met Patient/caregiver will verbalize understanding of skin care regimen Date Initiated: 03/17/2022 Date Inactivated: 09/28/2022 Target Resolution Date: 03/16/2022 Goal Status: Met Patient/caregiver will verbalize/demonstrate measure taken to improve self care Date Initiated: 03/17/2022 Target Resolution Date: 04/20/2022 Goal Status: Active Interventions: Assess fall risk on admission and as needed Provide education on basic hygiene Provide education on personal and home safety Notes: Osteomyelitis Nursing Diagnoses: Infection: osteomyelitis Knowledge deficit related to disease process and management Goals: Patient/caregiver will verbalize understanding of disease process and disease management Date Initiated: 03/17/2022 Date Inactivated: 03/27/2023 Target Resolution Date: 03/16/2022 Goal Status: Met Patient's osteomyelitis will resolve Date Initiated: 03/17/2022 Target Resolution Date: 10/26/2022 Goal Status: Active Signs and symptoms for osteomyelitis will be recognized and promptly addressed Date Initiated: 03/17/2022 Target Resolution Date: 10/26/2022 Goal Status: Active Interventions: Assess for signs and symptoms of osteomyelitis resolution every visit Provide education on osteomyelitis [name], [name] ([id number]) [id number] Page 6 of 10 Treatment Activities: Systemic antibiotics : 03/16/2022 Notes: Patient on antibiotics. Electronic Signature(s) Signed: 05/08/2023 4:35:54 PM By: [name] Signed: 05/16/2023 8:06:11 AM By: Elliot Gurney, BSN, RN, CWS, [name] RN, BSN Entered By: [name] on 05/03/2023  11:41:37 -------------------------------------------------------------------------------- Pain Assessment Details Patient Name: Date of Service: Heather Jefferson [name]. 05/03/2023 11:00 [name]: [id number] Patient Account Number: [id number] Date of Birth/Sex: Treating RN: 08/29/1976 (47 y.o. Skip Mayer Primary Care Findley Vi: [name] Other Clinician: [name] Referring Azazel Franze: Treating Karmella Bouvier/Extender: Chapman Moss in Treatment: 53 Active Problems Location of Pain Severity and Description of Pain Patient Has Paino No Site Locations Pain Management and Medication Current Pain Management: Electronic Signature(s) Signed: 05/08/2023 4:35:54 PM By: [name] Signed: 05/16/2023 8:06:11 AM By: Elliot Gurney, BSN, RN, CWS, [name] RN, BSN Entered By: [name] on 05/03/2023 11:22:59 [name] ([id number]) [id number] Page 7 of 10 -------------------------------------------------------------------------------- Patient/Caregiver Education Details Patient Name: Date of Service: [name], [name] 7/10/2024andnbsp11:00 [name]: [id number] Patient Account Number: [id number] Date of Birth/Gender: Treating RN: September 21, 1976 (47 y.o. Skip Mayer Primary Care Physician: [name] Other Clinician: [name] Referring Physician: Treating Physician/Extender: Chapman Moss in Treatment: 31 Education Assessment Education Provided To: Patient Education Topics Provided Wound/Skin Impairment: Handouts: Other: continue wound care as directed Methods: Explain/Verbal Responses: State content correctly Electronic Signature(s) Signed: 05/08/2023 4:35:54 PM By: [name] Entered By: [name] on 05/03/2023 12:14:35 -------------------------------------------------------------------------------- Wound Assessment Details Patient Name: Date of Service: Heather Jefferson  [name]. 05/03/2023 11:00 [name]: [id number] Patient Account Number: [id number] Date of Birth/Sex: Treating RN: August 25, 1976 (47 y.o. Skip Mayer Primary Care Zebastian Carico: [name] Other Clinician: [name] Referring Sonya Pucci: Treating Annaka Cleaver/Extender: [name] Weeks in Treatment: 59 Wound Status Wound Number: 11 Primary Open Surgical Wound Etiology: Wound Location: Right, Plantar Foot Wound Open Wounding Event: Surgical Injury Status: Date Acquired: 12/01/2021 Comorbid Chronic sinus problems/congestion, Middle ear problems, Anemia, Weeks Of Treatment: 59 History: Chronic Obstructive Pulmonary Disease (COPD), Congestive Heart Clustered Wound: No Failure, Type II Diabetes, End Stage Renal Disease, History of pressure wounds, Neuropathy Photos [name], [name] ([id number]) [id number] Page 8 of 10 Wound Measurements Length: (cm) 0.3 Width: (cm) 0.3 Depth: (cm) 0.4 Area: (cm) 0.071 Volume: (cm) 0.028 % Reduction in Area: 96.2% % Reduction in Volume: 98.3% Epithelialization: Medium (34-66%) Wound Description Classification: Full Thickness Without Exposed Suppo Wound Margin: Distinct, outline attached Exudate Amount: Small Exudate Type: Serous Exudate Color: amber rt Structures Foul Odor After Cleansing: No Slough/Fibrino Yes Wound Bed Granulation Amount: Small (1-33%) Exposed Structure Granulation Quality: Pink, Pale Fascia Exposed: No Necrotic Amount: Large (67-100%) Fat Layer (Subcutaneous Tissue) Exposed: Yes Necrotic Quality: Adherent Slough Tendon Exposed: No Muscle Exposed: No Joint Exposed: No Bone Exposed: No Electronic Signature(s) Signed: 05/08/2023 4:35:54 PM By: [name] Signed: 05/16/2023 8:06:11 AM By: Elliot Gurney, BSN, RN, CWS, [name] RN, BSN Entered By: [name] on 05/03/2023  11:34:18 -------------------------------------------------------------------------------- Wound Assessment Details Patient Name: Date of Service: Heather Jefferson [name]. 05/03/2023 11:00 [name]: [id number] Patient Account Number: [id number] Date of Birth/Sex: Treating RN: Mar 05, 1976 (47 y.o. Cathlean Cower, [name] Primary Care Stacyann Mcconaughy: [name] Other Clinician: [name] Referring Kenyona Rena: Treating Emmalin Jaquess/Extender: [name] Weeks in Treatment: 59 Wound Status Wound Number: 12 Primary Diabetic Wound/Ulcer of the Lower Extremity Etiology: Wound Location: Left, Medial, Plantar Foot Wound Open Wounding Event: Pressure Injury Status: Date Acquired: 03/16/2020 Comorbid Chronic sinus problems/congestion, Middle ear problems, Anemia, Weeks Of Treatment: 59 History: Chronic Obstructive Pulmonary Disease (COPD), Congestive Heart Clustered Wound: No Failure, Type II Diabetes, End Stage Renal Disease, History of pressure wounds, Neuropathy Photos [name], [name] ([id number]) [id number] Page 9 of 10 Wound Measurements Length: (cm) 0.3 Width: (cm) 0.3 Depth: (cm) 0.1 Area: (cm) 0.071 Volume: (cm) 0.007 % Reduction in Area: 98.5% % Reduction in Volume: 99.3% Epithelialization: None Wound Description Classification: Grade 3 Wound Margin: Flat and Intact Exudate Amount: Medium Exudate Type: Serosanguineous Exudate Color: red, brown Foul Odor After Cleansing: No Slough/Fibrino No Wound Bed Granulation Amount: Large (67-100%) Exposed Structure Granulation Quality: Pink, Pale Fascia Exposed: No Necrotic Amount: Small (1-33%) Fat Layer (Subcutaneous Tissue) Exposed: Yes Necrotic Quality: Adherent Slough Tendon Exposed: No Muscle Exposed: No Joint Exposed: No Bone Exposed: No Electronic Signature(s) Signed: 05/08/2023 4:35:54 PM By: [name] Signed: 05/16/2023 8:06:11 AM By: Elliot Gurney, BSN, RN, CWS, [name] RN,  BSN Entered By: [name] on 05/03/2023 11:34:54 -------------------------------------------------------------------------------- Vitals Details Patient Name: Date of Service: [name], [name] [name]. 05/03/2023 11:00 [name]: [id number] Patient Account Number: [id number] Date of Birth/Sex: Treating RN: December 16, 1975 (47 y.o. Cathlean Cower, [name] Primary Care Kingson Lohmeyer: [name] Other Clinician: [name] Referring Cayson Kalb: Treating Ugonna Keirsey/Extender: Chapman Moss in Treatment: 7 Vital Signs Time Taken: 11:19 Temperature (F): 98.5 Height (in): 69 Pulse (bpm): 58 Weight (lbs): 178 Respiratory Rate (breaths/min): 16 Body Mass Index (BMI): 26.3 Blood Pressure (mmHg): 130/69 Reference Range: 80 - 120 mg / dl Electronic  Signature(s) KYRIELLE, URBANSKI [name] ([id number]) [id number] Page 10 of 10 Signed: 05/08/2023 4:35:54 PM By: [name] Entered By: [name] on 05/03/2023 11:22:52

## 2023-05-16 NOTE — Progress Notes (Signed)
Heather, SPRAGGINS [name] ([id number]) [id number] Page 1 of 23 Visit Report for 04/26/2023 Chief Complaint Document Details Patient Name: Date of Service: [name], [name] 04/26/2023 11:00 [name] Medical Record Number: [id number] Patient Account Number: [id number] Date of Birth/Sex: Treating RN: Nov 17, 1975 (47 y.o. Skip Mayer Primary Care Provider: [name] Other Clinician: [name] Referring Provider: Treating Provider/Extender: Chapman Moss in Treatment: 53 Information Obtained from: Patient Chief Complaint 03/19/2021; patient referred by Dr. Excell Seltzer who has been looking after her left foot for quite [name] period of time for review of [name] nonhealing area in the left midfoot 03/12/2022; bilateral feet wounds and right lower extremity wound. Electronic Signature(s) Signed: 04/26/2023 4:13:50 PM By: Geralyn Corwin DO Entered By: Geralyn Corwin on 04/26/2023 12:17:47 -------------------------------------------------------------------------------- Debridement Details Patient Name: Date of Service: [name] 04/26/2023 11:00 [name] Medical Record Number: [id number] Patient Account Number: [id number] Date of Birth/Sex: Treating RN: Mar 06, 1976 (47 y.o. Cathlean Cower, Heather Primary Care Provider: [name] Other Clinician: [name] Referring Provider: Treating Provider/Extender: Chapman Moss in Treatment: 58 Debridement Performed for Assessment: Wound #12 Left,Medial,Plantar Foot Performed By: Physician Geralyn Corwin, MD Debridement Type: Debridement Severity of Tissue Pre Debridement: Fat layer exposed Level of Consciousness (Pre-procedure): Awake and Alert Pre-procedure Verification/Time Out Yes - 11:31 Taken: Start Time: 11:31 Percent of Wound Bed Debrided: 100% T Area Debrided (cm): otal 4.08 Tissue and other material debrided: Viable, Non-Viable, Callus Level: Non-Viable Tissue Debridement  Description: Selective/Open Wound Instrument: Curette Bleeding: Minimum Hemostasis Achieved: Pressure Response to Treatment: Procedure was tolerated well Level of Consciousness (Post- Awake and Alert [name], [name] ([id number]) [phone].pdf Page 2 of 23 Awake and Alert procedure): Post Debridement Measurements of Total Wound Length: (cm) 0.5 Width: (cm) 10.4 Depth: (cm) 0.2 Volume: (cm) 0.817 Character of Wound/Ulcer Post Debridement: Stable Severity of Tissue Post Debridement: Fat layer exposed Post Procedure Diagnosis Same as Pre-procedure Electronic Signature(s) Signed: 04/26/2023 4:13:50 PM By: Geralyn Corwin DO Signed: 04/26/2023 4:57:17 PM By: [name] Signed: 05/16/2023 8:06:11 AM By: [name], BSN, RN, CWS, [name] RN, [name] Entered By: [name] on 04/26/2023 11:31:55 -------------------------------------------------------------------------------- Debridement Details Patient Name: Date of Service: [name] 04/26/2023 11:00 [name] Medical Record Number: [id number] Patient Account Number: [id number] Date of Birth/Sex: Treating RN: 1976-03-15 (47 y.o. Cathlean Cower, Heather Primary Care Provider: [name] Other Clinician: [name] Referring Provider: Treating Provider/Extender: Chapman Moss in Treatment: 58 Debridement Performed for Assessment: Wound #11 Right,Plantar Foot Performed By: Physician Geralyn Corwin, MD Debridement Type: Debridement Level of Consciousness (Pre-procedure): Awake and Alert Pre-procedure Verification/Time Out Yes - 11:32 Taken: Start Time: 11:32 Percent of Wound Bed Debrided: 100% T Area Debrided (cm): otal 0.07 Tissue and other material debrided: Viable, Non-Viable, Callus, Slough, Skin: Dermis , Skin: Epidermis, Slough Level: Skin/Epidermis Debridement Description: Selective/Open Wound Instrument: Curette Bleeding: Minimum Hemostasis Achieved: Pressure Response to  Treatment: Procedure was tolerated well Level of Consciousness (Post- Awake and Alert procedure): Post Debridement Measurements of Total Wound Length: (cm) 0.3 Width: (cm) 0.3 Depth: (cm) 0.4 Volume: (cm) 0.028 Character of Wound/Ulcer Post Debridement: Stable Post Procedure Diagnosis Same as Pre-procedure Electronic Signature(s) Signed: 04/26/2023 4:13:50 PM By: Geralyn Corwin DO Signed: 04/26/2023 4:57:17 PM By: [name] Signed: 05/16/2023 8:06:11 AM By: [name], BSN, RN, CWS, [name] RN, [name] [place], [name] ([id number]) AM By: [name], BSN, RN, CWS, [name] RN, [name] [phone].pdf Page 3 of 23 Signed: 05/16/2023 8:06:11 Entered By: [name] on 04/26/2023 11:34:12 --------------------------------------------------------------------------------  HPI Details Patient Name: Date of Service: [name], [name] 04/26/2023 11:00 [name] Medical Record Number: [id number] Patient Account Number: [id number] Date of Birth/Sex: Treating RN: 1976-07-03 (47 y.o. Skip Mayer Primary Care Provider: [name] Other Clinician: [name] Referring Provider: Treating Provider/Extender: Chapman Moss in Treatment: 29 History of Present Illness HPI Description: 01/18/18-She is here for initial evaluation of the left great toe ulcer. She is [name] poor historian in regards to timeframe in detail. She states approximately 4 weeks ago she lacerated her toe on something in the house. She followed up with her primary care who placed her on Bactrim and ultimately [name] second dose of Bactrim prior to coming to wound clinic. She states she has been treating the toe with peroxide, Betadine and [name] Band-Aid. She did not check her blood sugar this morning but checked it yesterday morning it was 327; she is unaware of [name] recent A1c and there are no current records. She saw Dr. she would've orthopedics last week for an old injury to the left ankle, she states he did not see her  toe, nor did she bring it to his attention. She smokes approximately 1 pack cigarettes [name] day. Her social situation is concerning, she arrives this morning with her mother who appears extremely intoxicated/under the influence; her mother was asked to leave the room and be monitored by the patient's grandmother. The patient's aunt then accompanied the patient and the room throughout the rest of the appointment. We had [name] lengthy discussion regarding the deleterious effects of uncontrolled hyperglycemia and smoking as it relates to wound healing and overall health. She was strongly encouraged to decrease her smoking and get her diabetes under better control. She states she is currently on [name] diet and has cut down her [facility] consumption. The left toe is erythematous, macerated and slightly edematous with malodor present. The edema in her left foot is below her baseline, there is no erythema streaking. We will treat her with Santyl, doxycycline; we have ordered and xray, culture and provided [name] Peg assist surgical shoe and cultured the wound. 01/25/18-She is here in follow-up evaluation for [name] left great toe ulcer and presents with an abscess to her suprapubic area. She states her blood sugars remain elevated, feeling "sick" and if levels are below 250, but she is trying. She has made no attempt to decrease her smoking stating that we "can't take away her food in her cigarettes". She has been compliant with offloading using the PEG assist you. She is using Santyl daily. the culture obtained last week grew staph aureus and Enterococcus faecalis; continues on the doxycycline and Augmentin was added on Monday. The suprapubic area has erythema, no femoral variation, purple discoloration, minimal induration, was accessed with [name] cotton tip applicator with sanguinopurulent drainage, this was cultured, I suspect the current antibiotic treatment will cover and we will not add anything to her current treatment plan.  She was advised to go to urgent care or ER with any change in redness, induration or fever. 02/01/18-She is here in follow-up evaluation for left great toe ulcers and [name] new abdominal abscess from last week. She was able to use packing until earlier this week, where she "forgot it was there". She states she was feeling ill with GI symptoms last week and was not taking her antibiotic. She states her glucose levels have been predominantly less than 200, with occasional levels between 200-250. She thinks this was contributing to her GI symptoms  as they have resolved without intervention. There continues to be significant laceration to left toe, otherwise it clinically looks stable/improved. There is now less superficial opening to the lateral aspect of the great toe that was residual blister. We will transition to [facility] to all wounds, she will continue her Augmentin. If there is no change or deterioration next week for reculture. 02/08/18-She is here in follow-up evaluation for left great toe ulcer and abdominal ulcer. There is an improvement in both wounds. She has been wrapping her left toe with coban, not by our direction, which has created an area of discoloration to the medial aspect; she has been advised to NOT use coban secondary to her neuropathy. She states her glucose levels have been high over this last week ranging from 200-350, she continues to smoke. She admits to being less compliant with her offloading shoe. We will continue with same treatment plan and she will follow-up next week. 02/15/18-She is here in follow-up evaluation for left great toe ulcer and abdominal ulcer. The abdominal ulcer is epithelialized. The left great toe ulcer is improved and all injury from last week using the Coban wrap is resolved, the lateral ulcer is healed. She admits to noncompliance with wearing offloading shoe and admits to glucose levels being greater than 300 most of the week. She continues to smoke  and expresses no desire to quit. There is one area medially that probes deeper than it has historically, erythema to the toe and dorsal foot has consistently waxed and waned. There is no overt signs of cellulitis or infection but we will culture the wound for any occult infection given the new area of depth and erythema. We will hold off on sensitivities for initiation of antibiotic therapy. 02/22/18-She is here in follow up evaluation for left great toe ulcer. There is overall significant improvement in both wound appearance, erythema and edema with changes made last week. She was not initiated on antibiotic therapy. Culture obtained last week showed oxacillin sensitive staph aureus, sensitive to clindamycin. Clindamycin has been called into the pharmacy but she has been instructed to hold off on initiation secondary to overall clinical improvement and her history of antibiotic intolerance. She has been instructed to contact the clinic with any noted changes/deterioration and the wound, erythema, edema and/or pain. She will follow-up next week. She continues to smoke and her glucose levels remain elevated >250; she admits to compliance with offloading shoe 03/01/18 on evaluation today patient appears to be doing fairly well in regard to her left first toe ulcer. She has been tolerating the dressing changes with the [facility] Dressing without complication and overall this has definitely showed signs of improvement according to records as well is what the patient tells me today. I'[name] very pleased in that regard. She is having no pain today 03/08/18 She is here for follow up evaluation of [name] left great toe ulcer. She remains non-compliant with glucose control and smoking cessation; glucose levels consistently >200. She states that she got new shoe inserts/peg assist. She admits to compliance with offloading. Since my last evaluation there is significant improvement. We will switch to prisma at this time  and she will follow up next week. She is noted to be tachycardic at this appointment, heart rate 120s; she has [name] history of heart rate 70-130 according to our records. She admits to extreme agitation r/t personal issues; she was advised to monitor her heartrate and contact her physician if it does not return to [name] more  normal range (<100). She takes cardizem twice daily. 03/15/18-She is here in follow-up evaluation for left great toe ulcer. She remains noncompliant with glucose control and smoking cessation. She admits to compliance with wearing offloading shoe. The ulcer is improved/stable and we will continue with the same treatment plan and she will follow-up next week 03/22/18-She is here for evaluation for left great toe ulcer. There continues to be significant improvement despite recurrent hyperglycemia (over 500 yesterday) and she continues to smoke. She has been compliant with offloading and we will continue with same treatment plan and she will follow-up next week. 03/29/18-She is here for evaluation for left great toe ulcer. Despite continuing to smoke and uncontrolled diabetes she continues to improve. She is compliant with offloading shoe. We will continue with the same treatment plan and she will follow-up next week 04/05/18- She is here in follow up evaluation for [name] left great toe ulcer; she presents with small pustule to left fifth toe (resembles ant bite). She admits to compliance with wearing offloading shoe; continues to smoke or have uncontrolled blood glucose control. There is more callus than usual with evidence of bleeding; she denies known trauma. [name], [name] ([id number]) [id number] Page 4 of 23 04/12/18-She is here for evaluation of left great toe ulcer. Despite noncompliance with glycemic control and smoking she continues to make improvement. She continues to wear offloading shoe. The pustule, that was identified last week, to the left fifth toe is  resolved. She will follow-up in 2 weeks 05/03/18-she is seen in follow-up evaluation for [name] left great toe ulcer. She is compliant with offloading, otherwise noncompliant with glycemic control and smoking. She has plateaued and there is minimal improvement noted. We will transition to [facility], replaced the insert to her surgical shoe and she will follow-up in one week 05/10/18- She is here in follow up evaluation for [name] left great toe ulcer. It appears stable despite measurement change. We will continue with same treatment plan and follow up next week. 05/24/18-She is seen in follow-up evaluation for [name] left great toe ulcer. She remains compliant with offloading, has made significant improvement in her diet, decreasing the amount of sugar/soda. She said her recent A1c was 10.9 which is lower than. She did see [name] diabetic nutritionist/educator yesterday. She continues to smoke. We will continue with the same treatment plan and she'll follow-up next week. 05/31/18- She is seen in follow-up evaluation for left great toe ulcer. She continues to remain compliant with offloading, continues to make improvement in her diet, increasing her water and decreasing the amount of sugar/soda. She does continue to smoke with no desire to quit. We will apply Prisma to the depth and Hydrofera Blue over. We have not received insurance authorization for oasis. She will follow up next week. 06/07/18-She is seen in follow-up evaluation for left great toe ulcer. It has stalled according to today's measurements although base appears stable. She says she saw [name] diabetic educator yesterday; her average blood sugars are less than 300 which is an improvement for her. She continues to smoke and states "that's my next step" She continues with water over soda. We will order for xray, culture and reinstate ace wrap compression prior to placing apligraf for next week. She is voicing no complaints or concerns. Her dressing will change to  iodoflex over the next week in preparation for apligraf. 06/14/18-She is seen in follow-up evaluation for left great toe ulcer. Plain film x-ray performed last week was negative for osteomyelitis. Wound culture  obtained last week grew strep [name] and OSSA; she is initiated on keflex and cefdinir today; there is erythema to the toe which could be from ace wrap compression, she has [name] history of wrapping too tight and has has been encouraged to maintain ace wraps that we place today. We will hold off on application of apligraf today, will apply next week after antibiotic therapy has been initiated. She admits today that she has resumed taking [name] shower with her foot/toe submerged in water, she has been reminded to keep foot/toe out of the bath water. She will be seen in follow up next week 06/21/18-she is seen in follow-up evaluation for left great toe ulcer. She is tolerating antibiotic therapy with no GI disturbance. The wound is stable. Apligraf was applied today. She has been decreasing her smoking, only had 4 cigarettes yesterday and 1 today. She continues being more compliant in diabetic diet. She will follow-up next week for evaluation of site, if stable will remove at 2 weeks. 06/28/18- She is here in follow up evalution. Apligraf was placed last week, she states the dressing fell off on Tuesday and she was dressing with hydrofera blue. She is healed and will be discharged from the clinic today. She has been instructed to continue with smoking cessation, continue monitoring glucose levels, offloading for an additional 4 weeks and continue with hydrofera blue for additional two weeks for any possible microscopic opening. Readmission: 08/07/18 on evaluation today patient presents for reevaluation concerning the ulcer of her right great toe. She was previously discharged on 06/28/18 healed. Nonetheless she states that this began to show signs of drainage she subsequently went to her primary care provider.  Subsequently an x-ray was performed on 08/01/18 which was negative. The patient was also placed on antibiotics at that time. Fortunately they should have been effective for the infection. Nonetheless she's been experiencing some improvement but still has [name] lot of drainage coming from the wound itself. 08/14/18 on evaluation today patient's wound actually does show signs of improvement in regard to the erythema at this point. She has completed the antibiotics. With that being said we did discuss the possibility of placing her in [name] total contact cast as of today although I think that I may want to give this just [name] little bit more time to ensure nothing recurrence as far as her infection is concerned. I do not want to put in the cast and risk infection at that time if things are not completely resolved. With that being said she is gonna require some debridement today. 08/21/18 on evaluation today patient actually appears to be doing okay in regard to her toe ulcer. She's been tolerating the dressing changes without complication. With that being said it does appear that she is ready and in fact I think it's appropriate for [place] to go ahead and initiate the total contact cast today. Nonetheless she will require some sharp debridement to prepare the wound for application. Overall I feel like things have been progressing well but we do need to do something to get this to close more readily. 08/24/18 patient seen today for reevaluation after having had the total contact cast applied on Tuesday. She seems to have done very well the wound appears to be doing great and overall I'[name] pleased with the progress that she's made. There were no abnormal areas of rubbing from the cast on her lower extremity. 08/30/18 on evaluation today patient actually appears to be completely healed in regard to her plantar  toe ulcer. She tells me at this point she's been having [name] lot of issues with the cast. She almost fell [name] couple of  times the state shall the step of her dog [name] couple times as well. This is been [name] very frustrating process for her other nonetheless she has completely healed the wound which is excellent news. Overall there does not appear to be the evidence of infection at this time which is great news. 09/11/18 evaluation today patient presents for follow-up concerning her great toe ulcer on the left which has unfortunately reopened since I last saw her which was only [name] couple of weeks ago. Unfortunately she was not able to get in to get the shoe and potentially the AFO that's gonna be necessary due to her left foot drop. She continues with offloading shoe but this is not enough to prevent her from reopening it appears. When we last had her in the total contact cast she did well from [name] healing standpoint but unfortunately the wound reopened as soon as she came out of the cast within just [name] couple of weeks. 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 This is also part of the reason why she falls on [name] regular basis. I just do not believe that is gonna be safe for [place] to reinitiate the total contact cast as last time we had this on she fell 3 times one week which is definitely not normal for her. 09/18/18 upon evaluation today the patient actually appears to be doing about the same in regard to her toe ulcer. She did not contact [employer] as I asked her to even though I had given her the prescription. In fact she actually states that she has no idea where the prescription is. She did apparently call [employer] and they told her that all she needed to do was bring the prescription in order to be able to be seen and work on getting the AFO for her left foot. With all that being said she still does not have an appointment and I'[name] not  sure were things stand that regard. I will give her [name] new prescription today in order to contact them to get this set up. 09/25/18 on evaluation today patient actually appears to be doing about the same in regard to her toes ulcer. She does have [name] small areas which seems to have [name] lot of callous buildup around the edge of the wound which is going to need sharp debridement today. She still is waiting to be scheduled for evaluation with [employer] for possibility of an AFO. She states there supposed to call her tomorrow to get this set up. Unfortunately it does appear that her foot specifically the toe area is showing signs of erythema. There does not appear to be any systemic infection which is in these good news. 10/02/18 on evaluation today patient actually appears to be doing about the same in regard to her toe ulcer. This really has not done too well although it's not significantly larger it's also not significantly smaller. She has been tolerating the dressing changes without complication. She actually has her appointment with [employer] and [facility] tomorrow to hopefully be measured for obtaining and AFO splint. I think this would be helpful preventing this from reoccurring. We  had contemplated starting the cast this week although to be honest I am reluctant to do that as she's been having nausea, vomiting, and seizure activity over the past three days. She has [name] history of seizures and have been told is nothing that can be done for these. With that being said I do believe that along with the seizures have the nausea vomiting which upon further questioning doesn't seem to be the normal for her and makes me concerned for the possibility of infection or something else going on. I discussed this with the patient and her mother during the office visit today. I do not feel the wound is effective but maybe something else. The responses this was "this just happens to her at times and we don't know why". They did  not seem to be interested in going to the hospital to have this checked out further. 10/09/18 on evaluation today patient presents for follow-up concerning her ongoing toe ulcer. She has been tolerating the dressing changes without complication. Fortunately there does not appear to be any evidence of infection which is great news however I do think that the patient would benefit from going ahead for with the total contact cast. She's actually in [name] wheelchair today she tells me that she will use her walker if we initiate the cast. I was very specific about the fact that if we were gonna do the cast I wanted to make sure that she was using the walker in order to prevent any falls. She tells me she does not have stairs that she has to traverse on [name] regular basis at her home. She has not had any seizures since last week again that something that happens to her often she tells me she did talk to [employer] and they said that it may take up to three weeks to get the brace approved for her. Hopefully that will not take that long but nonetheless in the meantime I do think the cast could be of benefit. [name], [name] ([id number]) [id number] Page 5 of 23 10/12/18 on evaluation today patient appears to be doing rather well in regard to her toe ulcer. It's just been [name] few days and already this is significantly improved both as far as overall appearance and size. Fortunately there's no sign of infection. She is here for her first obligatory cast change. 10/19/18 Seen today for follow up and management of left great toe ulcer. Wound continues to show improvement. Noted small open area with seroussang drainage with palpation. Denies any increased pain or recent fevers during visit. She will continue calcium alginate with offloading shoe. Denies any questions or concerns during visit. 10/26/18 on evaluation today patient appears to be doing about the same as when I last saw her in regard to  her wound bed. Fortunately there does not appear to be any signs of infection. Unfortunately she continues to have [name] breakdown in regard to the toe region any time that she is not in the cast. It takes almost no time at all for this to happen. Nonetheless she still has not heard anything from the brace being made by [employer] as to when exactly this will be available to her. Fortunately there is no signs of infection at this time. 10/30/18 on evaluation today patient presents for application of the total contact cast as we just received him this morning. Fortunately we are gonna be able to apply this to her today which is great news. She continues to have no significant  pain which is good news. Overall I do feel like things have been improving while she was the cast is when she doesn't have [name] cast that things get worse. She still has not really heard anything from [employer] regarding her brace. 11/02/18 upon evaluation today patient's wound already appears to be doing significantly better which is good news. Fortunately there does not appear to be any signs of infection also good news. Overall I do think the total contact cast as before is helping to heal this area unfortunately it's just not gonna likely keep the area closed and healed without her getting her brace at least. Again the foot drop is [name] significant issue for her. 11/09/18 on evaluation today patient appears to be doing excellent in regard to her toe ulcer which in fact is completely healed. Fortunately we finally got the situation squared away with the paperwork which was needed to proceed with getting her brace approved by Medicaid. I have filled that out unfortunately that information has been sent to the orthopedic office that I worked at 2 1/2 years ago and not tired Current wound care measures. Fortunately she seems to be doing very well at this time. 11/23/18 on evaluation today patient appears to be doing More Poorly Compared to Last Time I  Saw Her. At [facility] She Had Completely Healed. Currently she is continuing to have issues with reopening. She states that she just found out that the brace was approved through Medicaid now she just has to go get measured in order to have this fitted for her and then made. Subsequently she does not have an appointment for this yet that is going to complicate things we obviously cannot put her back in the cast if we do not have everything measured because they're not gonna be able to measure her foot while she is in the cast. 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 Obviously this is unfortunate and does have me somewhat worried as well. 11/30/18 on evaluation today patient's toe ulcer actually appears to be doing fairly well. The good news is she will be getting her brace in the shoes next week on Wednesday. Hopefully we will be able to get this to heal without having to go back in the cast however she may need the cast in order to get the wound completely heal and then go from there. Fortunately there's no signs of infection at this time. 12/07/18 on evaluation today patient fortunately did receive her brace and she states she could tell this definitely makes her walk better. With that being said she's been having issues with her toe where she noticed yesterday there was [name] lot of tissue that was loosing off this appears to be much larger than what it was previous. She also states that her leg has been read putting much across the top of her foot just about the ankle although this seems to be receiving somewhat. The total area is still red and appears to be someone infected as best I can tell. She is previously taken Bactrim and that may be [name] good option for her today as well. We are gonna see what I wound culture shows as well and I think that this is definitely appropriate. With that being said  outside of the culture I still need to initiate something in the interim and that's what I'[name] gonna go ahead  and select Bactrim is [name] good option for her. 12/14/18 on evaluation today patient appears to be doing better in regard to her left great toe ulcer as compared to last week's evaluation. There's still some erythema although this is significantly improved which is excellent news. Overall I do believe that she is making good progress is still gonna take some time before she is where I would like her to be from the standpoint of being able to place her back into the total contact cast. Hopefully we will be where we need to be by next week. 12/21/18 on evaluation today patient actually appears to be doing poorly in regard to her toe ulcer. She's been tolerating the dressing changes without complication. Fortunately there's no signs of systemic infection although she does have [name] lot of drainage from the toe ulcer and this does seem to be causing some issues at this point. She does have erythema on the distal portion of her toe that appears to be likely cellulitis. 12/28/18 on evaluation today patient actually appears to be doing [name] little better in my pinion in regard to her toe ulcer. With that being said she still does have some evidence of infection at this time and for her culture she had both E. coli as well as enterococcus as organisms noted on evaluation. For that reason I think that though the Keflex likely has treated the E. coli rather well this has really done nothing for the enterococcus. We are going to have to initiate treatment for this specifically. 01/04/19 on evaluation today patient's toe actually appears to be doing better from the standpoint of infection. She currently would like to see about putting the cash back on I think that this is appropriate as long as she takes care of it and keeps it from getting wet. She is gonna have some drainage we can definitely pass this up with Drawtex  and alginate to try to prevent as much drainage as possible from causing the problems. With that being said I do want to at least try her with the cast between now and Tuesday. If there any issues we can't continue to use it then I will discontinue the use of the cast at that point. 01/08/19 on evaluation today patient actually appears to be doing very well as far as her foot ulcer specifically the great toe on the left is concerned. She did have an area of rubbing on the medial aspect of her left ankle which again is from the cast. Fortunately there's no signs of infection at this point in this appears to be [name] very slight skin breakdown. The patient tells me she felt it rubbing but didn't think it was that bad. Fortunately there is no signs of active infection at this time which is good news. No fevers, chills, nausea, or vomiting noted at this time. 01/15/19 on evaluation today patient actually appears to be doing well in regard to her toe ulcer. Again as previous she seems to do well and she has the cast on which indicates to me that during the time she doesn't have [name] cast on she's putting way too much pressure on this region. Obviously I think that's gonna be an issue as with the current national emergency concerning the Covid-19 Virus it has been recommended that we discontinue the use of total contact casting by the chief medical officer of our company, Dr. Maurine Minister. The reasoning is that if [name] patient becomes sick and cannot come into have the  cast removed they could not just leave this on for an additional two weeks. Obviously the hospitals also do not want to receive patient's who are sick into the emergency department to potentially contaminate the region and spread the Covid-19 Virus among other sick individuals within the hospital system. Therefore at this point we are suspending the use of total contact cast until the current emergency subsides. This was all discussed with the patient today as  well. 01/22/19 on evaluation today patient's wound on her left great toe appears to be doing slightly worse than previously noted last week. She tells me that she has been on this quite [name] bit in fact she tells me she's been awake for 38 straight hours. This is due to the fact that she's having to care for grandparents because nobody else will. She has been taking care of them for five the last seven days since I've seen her they both have dementia his is from [name] stroke and her grandmother's was progressive. Nonetheless she states even her mom who knows her condition and situation has only help two of those days to take care of them she's been taking care of the rest. Fortunately there does not appear to be any signs of active infection in regard to her toe at this point although obviously it doesn't look as good as it did previous. I think this is directly related to her not taking off the pressure and friction by way of taking things easy. Though I completely understand what's going on. 01/29/19 on evaluation today patient's tools are actually appears to be showing some signs of improvement today compared to last week's evaluation as far as not necessarily the overall size of the wound but the fact that she has some new skin growth in between the two ends of the wound opening. Overall I feel like she has done well she states that she had [name] family member give her what sounds to be [name] CAM walker boot which has been helpful as well. 02/05/19 on evaluation today patient's wound bed actually appears to be doing significantly better in regard to her overall appearance of the size of the wound. With that being said she is still having an issue with offloading efficiently enough to get this to close. Apparently there is some signs of infection at this point as well unfortunately. Previously she's done well of Augmentin I really do not see anything that needs to be culture currently but there theme and cellulitis  of the foot that I'[name] seeing I'[name] gonna go ahead and place her on an antibiotic today to try to help clear this up. [name], [name] ([id number]) [id number] Page 6 of 23 02/12/2019 on evaluation today patient actually appears to be doing poorly in regard to her overall wound status. She tells me she has been using her offloading shoe but actually comes in today wearing her tennis shoe with the AFO brace. Again as I previously discussed with her this is really not sufficient to allow the area to heal appropriately. Nonetheless she continues to be somewhat noncompliant and I do wonder based on what she has told my nurse in the past as to whether or not [name] good portion of this noncompliance may be recreational drug and alcohol related. She has had [name] history of heroin overdose and this was fairly recently in the past couple of months that have been seeing her. Nonetheless overall I feel like her wound looks significantly worse today compared to what it  was previous. She still has significant erythema despite the Augmentin I am not sure that this is an appropriate medication for her infection I am also concerned that the infection may have gone down into her bone. 02/19/19 on evaluation today patient actually appears to be doing about the same in regard to her toe ulcer. Unfortunately she continues to show signs of bone exposure and infection at this point. There does not appear to be any evidence of worsening of the infection but I'[name] also not really sure that it's getting significantly better. She is on the Augmentin which should be sufficient for the Staphylococcus aureus infection that she has at this point. With that being said she may need IV antibiotics to more appropriately treat this. We did have [name] discussion today about hyperbaric option therapy. 02/28/19 on evaluation today patient actually appears to be doing much worse in regard to the wound on her left great toe as compared  to even my previous evaluation last week. Unfortunately this seems to be training in [name] pretty poor direction. Her toe was actually now starting to angle laterally and I can actually see the entire joint area of the proximal portion of the digit where is the distal portion of the digit again is no longer even in contact with the joint line. Unfortunately there's [name] lot more necrotic tissue around the edge and the toe appears to be showing signs of becoming gangrenous in my pinion. I'[name] very concerned about were things stand at this point. She did see infectious disease and they are planning to send in [name] prescription for Sivextro for her and apparently this has been approved. With that being said I don't think she should avoid taking this but at the same time I'[name] not sure that it's gonna be sufficient to save her toe at this point. She tells me that she still having to care for grandparents which I think is putting quite [name] bit of strain on her foot and specifically the total area and has caused this to break down even to [name] greater degree than would've otherwise been expected. 03/05/19 on evaluation today patient actually appears to be doing quite well in regard to her toe all things considering. She still has bone exposed but there appears to be much less your thing on overall the appearance of the wound and the toe itself is dramatically improved. She still does have some issues currently obviously with infection she did see vascular as well and there concerned that her blood flow to the toad. For that reason they are setting up for an angiogram next week. 03/14/19 on evaluation today patient appears to be doing very poor in regard to her toe and specifically in regard to the ulceration and the fact that she's starting to notice the toe was leaning even more towards the lateral aspect and the complete joint is visible on the proximal aspect of the joint. Nonetheless she's also noted [name] significant odor and  the tip of the toe is turning more dark and necrotic appearing. Overall I think she is getting worse not better as far as this is concerned. For that reason I am recommending at this point that she likely needs to be seen for likely amputation. READMISSION 03/19/2021 This is [name] patient that we cared for in this clinic for [name] prolonged period of time in 2019 and 2020 with [name] left foot and left first toe wound. I believe she ultimately became infected and underwent [name] left first toe  amputation. 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 Excell Seltzer. In December 2021 she had an ulcer on her right great toe as well as the fourth and fifth toes. 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 She also had an angiogram at that time and underwent angioplasty of the right anterior tibial artery. In any case she claims that the wound on the right foot is closed I did not look at this today which was probably an oversight although I think that should be done next week. 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 According to Dr. Bernette Redbird last review that she was out of the area being cared for by another physician but recently came back to his attention. 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 [name] culture of this area showed E. coli apparently before she came back to see Dr. Excell Seltzer she was supposed to be receiving antibiotics but she did not really take them. Nor is she offloading this area at all. Finally her last hemoglobin A1c listed in epic was in March 2022 at 14.1 she says things are [name] lot better since then although I am not sure. 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 She had this wound at the time. During this admission she had E. coli sepsis [name] TEE was negative. 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 She is still [name] heavy cigarette smoker. She has [name] history of bipolar disorder seizure disorder ABI in our clinic was 1.05 6/1; left midfoot in the setting of [name] TMA done previously. Round circular wound with [name] "knuckle" of protruding tissue. The problem is that the knuckle was not attached to any of the surrounding granulation and this probed proximally widely I removed [name] large portion of this tissue. This wound goes with considerable undermining laterally. I do not feel any bone there was no purulence but this is [name] deep wound. 6/8; in spite of the debridement I did last week. She arrives with [name] wound looking exactly the same. [name] protruding "knuckle" of tissue nonadherent to most of the surrounding tissue. There is considerable depth around this from 6-12 o'clock at 2.7 cm and undermining of 1 cm. This does not look overtly infected and the x- ray I did last week was negative for any osseous abnormalities. We have been using silver collagen 6/15; deep tissue culture I did last week showed moderate staph aureus and moderate Pseudomonas. This will definitely require prolonged antibiotic therapy. The pathology on the protuberant area was negative for malignancy fungus etc. 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 We have been using silver collagen. I am going to be prescribing Levaquin for 2 weeks. Her [address] scan of the foot is down for 7/5 6/22; CT scan of the foot on 7 5. She says she has hardware in the left leg from her previous fracture. She is on the Levaquin for the deep tissue culture I did that showed methicillin sensitive staph aureus and Pseudomonas. I gave her [name] 2-week supply and she will  have another week. She arrives  in clinic today with the same protuberant tissue however this is nonadherent to the tissue surrounding it. I am really at [name] loss to explain this unless there is underlying deep tissue infection 6/29; patient presents for 1 week follow-up. She has been using collagen to the wound bed. She reports taking her antibiotics as prescribed.She has no complaints or issues today. She denies signs of infection. 7/6; patient presents for one week followup. She has been using collagen to the wound bed. She states she is taking Levaquin however at times she is not able to keep it down. She denies signs of infection. 7/13; patient presents for 1 week follow-up. She has been using silver alginate to the wound bed. She still has nausea when taking her antibiotics. She denies signs of infection. 7/20; patient presents for 1 week follow-up. She has been using silver alginate with gentamicin cream to the wound bed. She denies any issues and has no complaints today. She denies signs of infection. 7/27; patient presents for 1 week follow-up. She continues to use silver alginate with gentamicin cream to the wound bed. She reports starting her antibiotics. She has no issues or complaints. Overall she reports stability to the wound. 8/3; patient presents for 1 week follow-up. She has been using silver alginate with gentamicin cream to the wound bed. She reports completing all antibiotics. She has no issues or complaints today. She denies signs of infection. 8/17; patient presents for 2-week follow-up. He is to use silver alginate to the wound bed. She has no issues or complaints today. She denies signs of infection. She reports her pain has improved in her foot since last clinic visit 8/24; patient presents for 1 week follow-up. She continues to use silver alginate to the wound bed. She has no issues or complaints. She denies signs of infection. Pain is stable. 9/7; patient presents for  follow-up. She missed her last week appointment due to feeling ill. She continues to use silver alginate. She has [name] new wound to the right lower extremity that is covered in eschar. She states It occurred over the past week and has no idea how it started. She currently denies signs of infection. 9/14; patient presents for follow-up. T the left foot wound she has been using gentamicin cream and silver alginate. T the right lower extremity wound she has o o been keeping this covered and has not obtain Santyl. 9/21; patient presents for follow-up. She reports using gentamicin cream and silver alginate to the left foot and Santyl to the right lower extremity wound. She has no issues or complaints today. She denies signs of infection. 9/28; patient presents for follow-up. She reports [name] new wound to her right heel. She states this occurred [name] few days ago and is progressively gotten worse. She [name], [name] ([id number]) [id number] Page 7 of 23 has been trying to clean the area with [name] Q-tip and Santyl. She reports stability in the other 2 wounds. She has been using gentamicin cream and silver alginate to the left foot and Santyl to the right lower extremity wound. 10/12; patient presents for follow-up. She reports improvement to the wound beds. She is seeing vein and vascular to discuss the potential of [name] left BKA. She states they are going to do an arteriogram. She continues to use silver alginate with dressing changes to her wounds. 11/2; patient presents for follow-up. She states she has not been doing dressing changes to the wound beds. She states she is not able to offload  the areas. She reports chronic pain to her left foot wound. 11/9; patient presents for follow-up. She came in with only socks on. She states she forgot to put on shoes. It is unclear if she is doing any dressing changes. She currently denies systemic signs of infection. 11/16; patient presents for  follow-up. She came again only with socks on. She states she does not wear shoes ever. It is unclear if she does dressing changes. She currently denies systemic signs of infection. 11/23; patient presents for follow-up. She wore her shoes today. It still unclear exactly what dressing she is using for each wound but she did states she obtained Dakin's solution and has been using this to the left foot wound. She currently denies signs of infection. 11/30; patient presents for follow-up. She has no issues or complaints today. She currently denies signs of infection. 12/7; patient presents for follow-up. She has no issues or complaints today. She has been using Hydrofera Blue to the right heel wound and Dakin solution to the left foot wound. Her right anterior leg wound is healed. She currently denies signs of infection. 12/14; patient presents for follow-up. She has been using Hydrofera Blue to the right heel and Dakin's to the left foot wounds. She has no issues or complaints today. She denies signs of infection. 12/21; patient presents for follow-up. She reports using Hydrofera Blue to the right heel and Dakin's to the left foot wound. She denies signs of infection. 12/28; patient presents for follow-up. She continues to use Dakin's to the left foot wound and Hydrofera Blue to the right heel wound. She denies signs of infection. 1/4; patient presents for follow-up. She has no issues or complaints today. She denies signs of infection. 1/11; patient presents for follow-up. It is unclear if she has been dressing these wounds over the past week. She currently denies signs of infection. 1/18; patient presents for follow-up. She states she has been using Dakin's wet-to-dry dressings to the left foot. She has been using Hydrofera Blue to the right foot foot wound. She states that the anterior right leg wound has reopened and draining serous fluid. She denies signs of infection. 1/25; patient presents for  follow-up. She has no issues or complaints today. 2/1; patient presents for follow-up. She has no issues or complaints today. She denies signs of infection. 2/8; patient presents for follow-up. She has lost her surgical shoes. She did not have [name] dressing to the right heel wound. She currently denies signs of infection. 2/15; patient presents for follow-up. She reports more pain to the right heel today. She denies purulent drainage Or fever/chills 2/22; patient presents for follow-up. She reports taking clindamycin over the past week. She states that she continues to have pain to her right heel. She reports purulent drainage. Readmission 03/16/2022 Ms. Jewels Langone is [name] 48 year old female with [name] past medical history of type 2 diabetes, osteomyelitis to her feet, chronic systolic heart failure and bipolar disorder that presents to the clinic for bilateral feet wounds and right lower extremity wound. She was last seen in our clinic on 12/15/2021. At that time she had purulent drainage coming out of her right plantar foot and I recommended she go to the ED. She states she went to [facility] and has been there for the past 3 months. I cannot see the records. She states she had OR debridement and was on several weeks of IV antibiotics while inpatient. Since discharge she has not been taking care of the  wound beds. She had nothing on her feet other than socks today. She currently denies signs of infection. 5/31; patient presents for follow-up. She has been using Dakin's wet-to-dry dressings to the wound beds on her feet bilaterally and antibiotic ointment to the right anterior leg wound. She had [name] wound culture done at last clinic visit that showed moderate Pseudomonas aeruginosa sensitive to ciprofloxacin. She currently denies systemic signs of infection. 6/14; patient presents for follow-up. She received Keystone 5 days ago and has been using this on the wound beds. She states that last week  she had to go to the hospital because she had increased warmth and erythema to the right foot. She was started on 2 oral antibiotics. She states she has been taking these. She currently denies systemic signs of infection. She has no issues or complaints today. 6/21; patient presents for follow-up. She states she has been using Keystone antibiotics to the wound beds. She has no issues or complaints today. She denies signs of infection. 6/28; patient presents for follow-up. She has been using Keystone antibiotics to the wound beds. She has no issues or complaints today. 7/12; patient presents for follow-up. Has been using Keystone antibiotics to the wound beds with calcium alginate. She has no issues or complaints today. She never followed up with her orthopedic surgeon who did the OR debridement to the right foot. We discussed the total contact cast for the left foot and patient would like to do this next week. 7/19; patient presents for follow-up. She has been using Keystone antibiotics with calcium alginate to the wound beds. She has no issues or complaints today. Patient is in agreement to do the total contact cast of the left foot today. She knows to return later this week for the obligatory cast change. 05-13-2022 upon evaluation today patient's wound which she has the cast of the left leg actually appears to be doing significantly better. Fortunately I do not see any signs of active infection locally or systemically which is great news and overall I am extremely pleased with where we stand currently. 7/26; patient presents for follow-up. She has [name] cast in place for the past week. She states it irritated her shin. Other than that she tolerated the cast well. She states she would like [name] break for 1 week from the cast. We have been using Keystone antibiotic and Aquacel to both wound beds. She denies signs of infection. 8/2; patient presents for follow-up. She has been using Keystone and Aquacel to  the wound beds. She denies any issues and has no complaints. She is agreeable to have the cast placed today for the left leg. 06-03-2022 upon evaluation today patient appears to be doing well with regard to her wound she saw some signs of improvement which is great news. Fortunately I do not see any evidence of active infection locally or systemically at this time which is great news. No fevers, chills, nausea, vomiting, or diarrhea. 8/16; patient presents for follow-up. She has no issues or complaints today. We have been using Keystone and Aquacel to the wound beds. The left lower Pagliarulo, Faigy [name] ([id number]) [phone].pdf Page 8 of 23 extremity is in [name] total contact cast. She is tolerated this well. 8/23; patient presents for follow-up. She has had the total contact cast on the left leg for the past week. Unfortunately this has rubbed and broken down the skin to the medial foot. She currently denies signs of infection. She has been using Keystone antibiotic to the  right plantar foot wound. 8/30; patient presents for follow-up. We have held off on the total contact cast for the left leg for the past week. Her wound on the left foot has improved and the previous surrounding breakdown of skin has epithelialized. She has been using Keystone antibiotic to both wound beds. She has no issues or complaints today. She denies signs of infection. 9/6; patient presents for follow-up. She has ordered her's Keystone antibiotic and this is arriving this week. She has been doing Dakin's wet-to-dry dressings to the wound beds. She denies signs of infection. She is agreeable to the total contact cast today. 9/13; patient presents for follow-up. She states that the cast caused her left leg shin to be sore. She would like to take [name] break from the cast this week. She has been using Keystone antibiotic to the right plantar foot wound. She denies signs of infection. 9/20; patient presents for  follow-up. She has been using Keystone antibiotics to the wound beds with calcium alginate to the right foot wound and Hydrofera Blue to the left foot wound. She is agreeable to having the cast placed today. She has been approved for Apligraf and we will order this for next clinic visit. 9/27; patient presents for follow-up. We have been using Keystone antibiotic with Hydrofera Blue to the left foot wound under [name] total contact cast. T the right o foot wound she has been using Keystone antibiotic and calcium alginate. She declines [name] total contact cast today. Apligraf is available for placement and she would like to proceed with this. 07-28-2022 upon evaluation today patient appears to be doing well currently in regard to her wound. She is actually showing signs of significant improvement which is great news. Fortunately I do not see any evidence of active infection locally nor systemically at this time. She has been seeing Dr. Mikey Bussing and to be honest has been doing very well with the cast. Subsequently she comes in today with [name] cast on and we did reapply that today as well. She did not really want to she try to talk me out of that but I explained that if she wanted to heal this is really the right way to go. Patient voiced understanding. In regard to her right foot this is actually [name] lot better compared to the last time I saw her which is also great news. 10/11; patient presents for follow-up. Apligraf and the total contact cast was placed to the left leg at last clinic visit. She states that her right foot wound had burning pain to it with the placement of Apligraf to this area. She has been doing [place] over this area. She denies signs of infection including increased warmth, erythema or purulent drainage. 11/1; 3-week follow-up. The patient fortunately did not have [name] total contact cast or an Apligraf and on the left foot. She has been using Keystone ABD pads and kerlix and her own running  shoes She arrives in clinic today with thick callus and [name] very poor surface on the left foot on the right nonviable skin subcutaneous tissue and [name] deep probing hole. 11/15; patient missed her last clinic appointment. She states she has not been dressing the wound beds for the past 2 weeks. She states that at she had [name] new roommate but is now going back to live with her mother. Apparently its been [name] distracting 2 weeks. Patient currently denies signs of infection. 11/22; patient presents for follow-up. She states she has been using Keystone antibiotic  and Dakin's wet-to-dry dressings to the wound beds. She is agreeable for cast placement today. We had ordered Apligraf however this has not been received by our facility. 11/29; Patient had [name] total contact cast placed at last clinic visit and she tolerated this well. We were using silver alginate under the cast. Patient's been using Keystone antibiotic with Aquacel to the right plantar foot wound. She has no issues or complaints today. Apligraf is available for placement today. Patient would like to proceed with this. 12/6; patient presents for follow-up. She had Apligraf placed in standard fashion last clinic visit under the total contact cast to the left lower extremity. She has been using Keystone antibiotic and Aquacel to the right plantar foot wound. She has no issues or complaints today. 12/13; patient presents for follow-up. She has finished 5 Apligraf placements. Was told she would not qualify for more. We have been doing [name] total contact cast to the left lower extremity. She has been using Keystone antibiotic and Aquacel to the right plantar foot wound. She has no issues or complaints today. 12/20; patient presents for follow-up. We have been using Hydrofera Blue with Keystone antibiotic under [name] total contact cast of the left lower extremity. She reports using Keystone antibiotic and silver alginate to the right heel wound. She has no issues or  complaints today. 12/27; patient presents with [name] healthy wound on the left midfoot. We have Apligraf to apply that to that more also using [name] total contact cast. On the right we are using Keystone and silver alginate. She is offloading the right heel with [name] surgical shoealthough by her admission she is on her feet quite [name] bit 1/3; patient presents for follow-up. Apligraf was placed to the wound beds last clinic visit. She was placed in [name] total contact cast to the left lower extremity. She declines [name] total contact cast today. She states that her mother is in the hospital and she cannot adequately get around with the cast on. 1/10; patient presents for follow-up. She declined the total contact cast at last clinic visit. Both wounds have declined in appearance. She states that she has been on her feet and not offloading the wound beds. She currently denies signs of infection. 1/17; patient presents for follow-up. She had the total contact cast along with Apligraf placed last week to the left lower extremity. She tolerated this well. She has been using Aquacel Ag and Keystone antibiotic to the right heel wound. She currently denies signs of infection. She has no issues or complaints today. 1/24; patient presents for follow-up. We have been using Apligraf to the left foot wound along with [name] total contact cast. She has done well with this. T the right o heel wound she has been using Aquacel Ag and Keystone antibiotic ointment. She has no issues or complaints today. She denies signs of infection. 1/31; patient presents for follow-up. We have been using Apligraf to the left foot wound along with the total contact cast. She continues to do well with this. To the right heel we have been using Aquacel Ag and Keystone antibiotic ointment. She has no issues or complaints today. 12-01-2022 upon evaluation patient is seen today on my schedule due to the fact that she unfortunately was in the hospital yesterday. Her cast  needed to come off the only reason she is out of the hospital is due to the fact that they would not take it off at the hospital which is somewhat bewildered reading to me  to be perfectly honest. I am not certain why this was but either way she was released and then was placed on my schedule today in order to get this off and reapply the total contact casting as appropriate. I do not have an Apligraf for her today it was applied last week and today's actually expired yesterday as there was some scheduling conflicts with her being in the hospital. Nonetheless we do not have that for reapplication today but the good news that she is not draining too much and the Apligraf can go for up to 2 weeks so I am going to go ahead and reapply the total contact casting but we are going to leave the Apligraf in place. 2/14; patient presents for follow-up. T the left leg she has had the total contact cast and Apligraf for the past week. She has had no issues with this. T the o o right heel she has been using antibiotic ointment and Aquacel Ag. 2/21; patient presents for follow-up. We have been using Apligraf and [name] total contact cast to the left lower extremity. She is tolerated this well. Unfortunately she is not approved for any more Apligraf per insurance. She has been using antibiotic ointment and Aquacel Ag to the right foot. She has no issues or complaints today. 2/28; patient presents for follow-up. We have been using Hydrofera Blue and antibiotic ointment under the total contact cast to the left lower extremity. He has been using Aquacel Ag with antibiotic ointment to the right plantar foot. She has no issues or complaints today. 3/6; patient presents for follow-up. She did not obtain her gentamicin ointment. She has been using Aquacel Ag to the right plantar foot wound. We have been NAKEETA, SEBASTIANI [name] ([id number]) [id number] Page 9 of 23 using Hydrofera Blue with antibiotic  ointment under the total contact cast to the left lower extremity. She has no issues or complaints today. 3/12; patient presents for follow-up. She has been using gentamicin ointment and Aquacel Ag to the right plantar foot wound. We have been using Hydrofera Blue with antibiotic ointment under the total contact cast on the left lower extremity foot wound. She has no issues or complaints today. 3/20; patient presents for follow-up. She has been using gentamicin ointment and Aquacel Ag to the right plantar foot wound. We have been using Hydrofera Blue with antibiotic ointment under the total contact cast to the left lower extremity wound. She has no issues or complaints today. 3/27; patient presents for follow-up. We have been using antibiotic ointment and Aquacel Ag to the right plantar foot wound. We have been using Hydrofera Blue with antibiotic ointment under the total contact cast to the left lower extremity. Both wounds are smaller. 01-24-2023 upon evaluation today patient actually appears to be doing better compared to last time I saw her. It has been several weeks since I last saw her but nonetheless I think the total contact casting is doing [name] good job. Fortunately I do not see any evidence of infection or worsening overall which is great news and in general I do believe that she is moving in the appropriate direction. It has been [name] very long road but nonetheless I feel like she is nearing the end at least in regard to the left foot and even the right foot looks much better to be perfectly honest. 01-31-2023 upon evaluation today patient appears to be doing well currently in regard to her wounds. She has been require some sharp debridement which I think  is probably bone both sides to remove [name] lot of callus that has built up at this point. I discussed that with her today. Also think total contact cast is doing well for the left foot Emina continue that as well. 4/17; patient presents for follow-up.  We have been doing Hydrofera Blue with antibiotic ointment under the total contact cast to the left lower extremity and Aquacel Ag with antibiotic ointment to the right foot wound. She has been using her offloading heel shoe here. 4/24; patient presents for follow-up. We have been doing Hydrofera Blue with antibiotic ointment under the total contact cast to the left lower extremity and Aquacel Ag with antibiotic ointment to the right foot wound. She has been using her offloading heel shoe here. Wounds are smaller with slough accumulation. 5/1; patient presents for follow-up. We have been doing Hydrofera Blue with antibiotic ointment under the total contact cast to the left lower extremity and Aquacel Ag with antibiotic ointment to the right foot wound. Left foot wound is stable and right foot wound appears smaller. 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 On the left she is using Hydrofera Blue topical antibiotic under [name] total contact cast, on the right plantar heel gentamicin Aquacel Ag. According to our intake nurse both are making slow but steady improvements. 5/15; patient presents for follow-up. We have been doing Hydrofera Blue with antibiotic ointment under the total contact cast on the left and antibiotic ointment with Aquacel Ag on the right. Unfortunately the left foot wound has declined in size and appearance. We also do not have the total contact cast available in office today. Patient denies signs of infection. 5/22; patient presents for follow-up. We have been doing Hydrofera Blue and antibiotic ointment to the left foot and Aquacel Ag with antibiotic ointment to the right foot. We took [name] break from the cast since we did not have it placed at last week. Wounds look stable if not slightly improved. 03-27-2023 patient presents today she has had this For Most 2 Weeks Because She Was in the Hospital and They Did Not T It off. Again That Is Really Not ake Something That She Could Control but  Nonetheless We Are Seeing Her at This Point Today for Reevaluation. We Do Want to Go and See about Reapply the Cast. Again We Had All Set Everything up to Go Ahead and Do This When We Went to Get the Cast Only to Realize That Not Had Come in and We Were out at the Moment. With That Being [place] Spaniel We Will Get Have T Get the Exam before We Can Reapply the T Contact Casting Going Forward. o otal 6/12; patient presents for follow-up. She has been using Hydrofera Blue to the left foot wound and Aquacel Ag to the right foot wound. No cast was available last week placed on the patient so she has been offloading with her surgical shoe. She has no issues or complaints today. 6/19; patient presents for follow-up. We have been using Hydrofera Blue to the left foot wound under the total contact cast and Aquacel Ag to the right wound. She has no issues or complaints today. The wounds are smaller. 6/26; patient presents for follow-up. We have been using Hydrofera Blue to the left foot wound under the total contact cast and Aquacel Ag to the right foot wound. Wounds are smaller. Patient has no issues or complaints today. 7/3; patient presents for follow-up. We have been using Hydrofera Blue to the left foot wound under  the total contact cast and Aquacel Ag to the right foot wound. Wounds Appear smaller. No signs of infection. Electronic Signature(s) Signed: 04/26/2023 4:13:50 PM By: Geralyn Corwin DO Entered By: Geralyn Corwin on 04/26/2023 12:20:24 -------------------------------------------------------------------------------- Physical Exam Details Patient Name: Date of Service: [name] 04/26/2023 11:00 [name] Medical Record Number: [id number] Patient Account Number: [id number] Date of Birth/Sex: Treating RN: [birth year] (47 y.o. Skip Mayer Primary Care Provider: [name] Other Clinician: [name] Referring Provider: Treating Provider/Extender: Chapman Moss in  Treatment: [address] Betta [name] ([id number]) [id number] Page 10 of 23 . Cardiovascular . Psychiatric . Notes Right foot: T the plantar heel there is an incision site with increased depth, No probing to bone. T the opening there is slough and granulation tissue Left foot: o o T the medial aspect there is an open wound with granulation tissue, Callus and slough build up. No overt signs of infection to any of the wound beds. o Electronic Signature(s) Signed: 04/26/2023 4:13:50 PM By: Geralyn Corwin DO Entered By: Geralyn Corwin on 04/26/2023 12:20:41 -------------------------------------------------------------------------------- Physician Orders Details Patient Name: Date of Service: [name] 04/26/2023 11:00 [name] Medical Record Number: [id number] Patient Account Number: [id number] Date of Birth/Sex: Treating RN: 08-Feb-1976 (47 y.o. Skip Mayer Primary Care Provider: [name] Other Clinician: [name] Referring Provider: Treating Provider/Extender: Chapman Moss in Treatment: 25 Verbal / Phone Orders: Yes Clinician: Huel Coventry Read Back and Verified: Yes Diagnosis Coding Follow-up Appointments Return Appointment in 1 week. Nurse Visit as needed Bathing/ Shower/ Hygiene May shower with wound dressing protected with water repellent cover or cast protector. No tub bath. Anesthetic (Use 'Patient Medications' Section for Anesthetic Order Entry) Lidocaine applied to wound bed Edema Control - Lymphedema / Segmental Compressive Device / Other Elevate, Exercise Daily and [name] void Standing for Long Periods of Time. Elevate legs to the level of the heart and pump ankles as often as possible Elevate leg(s) parallel to the floor when sitting. Off-Loading Total Contact Cast to Left Lower Extremity - tcc applied left leg Open toe surgical shoe - Heel off loader - Right foot- Other: - keep pressure off  of feet. Additional Orders / Instructions Follow Nutritious Diet and Increase Protein Intake Other: - Stay off of foot, wheelchair only Wound Treatment Wound #11 - Foot Wound Laterality: Plantar, Right Cleanser: Wound Cleanser 3 x Per Week/30 Days Discharge Instructions: Wash your hands with soap and water. Remove old dressing, discard into plastic bag and place into trash. Cleanse the wound with Wound Cleanser prior to applying [name] clean dressing using gauze sponges, not tissues or cotton balls. Do not scrub or use excessive force. Pat dry using gauze sponges, not tissue or cotton balls. Prim Dressing: Aquacel Extra Hydrofiber Dressing, 2x2 (in/in) ary 3 x Per Week/30 Days Secondary Dressing: ABD Pad 5x9 (in/in) (Generic) 3 x Per Week/30 Days Discharge Instructions: Cover with ABD pad TRISTEN, PENNINO [name] ([id number]) [phone].pdf Page 11 of 23 Secured With: Medipore T - 45M Medipore H Soft Cloth Surgical T ape ape, 2x2 (in/yd) 3 x Per Week/30 Days Secured With: [employer] or Non-Sterile 6-ply 4.5x4 (yd/yd) 3 x Per Week/30 Days Discharge Instructions: Apply Kerlix as directed Wound #12 - Foot Wound Laterality: Plantar, Left, Medial Cleanser: Wound Cleanser 1 x Per Week/30 Days Discharge Instructions: Wash your hands with soap and water. Remove old dressing, discard into plastic bag and place into trash. Cleanse the wound with Wound Cleanser  prior to applying [name] clean dressing using gauze sponges, not tissues or cotton balls. Do not scrub or use excessive force. Pat dry using gauze sponges, not tissue or cotton balls. Prim Dressing: Hydrofera Blue Ready Transfer Foam, 2.5x2.5 (in/in) 1 x Per Week/30 Days ary Discharge Instructions: Apply Hydrofera Blue Ready to wound bed as directed Secondary Dressing: heel cup 1 x Per Week/30 Days Discharge Instructions: x2 for heel and front of foot Secured With: Medipore T - 6M Medipore H Soft Cloth Surgical T ape ape, 2x2  (in/yd) 1 x Per Week/30 Days Secured With: [employer] or Non-Sterile 6-ply 4.5x4 (yd/yd) 1 x Per Week/30 Days Discharge Instructions: Apply Kerlix as directed Electronic Signature(s) Signed: 04/26/2023 4:13:50 PM By: Geralyn Corwin DO Entered By: Geralyn Corwin on 04/26/2023 12:22:23 -------------------------------------------------------------------------------- Problem List Details Patient Name: Date of Service: [name] 04/26/2023 11:00 [name] Medical Record Number: [id number] Patient Account Number: [id number] Date of Birth/Sex: Treating RN: 1976-04-17 (47 y.o. 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 03/16/2022 No Yes E11.621 Type 2 diabetes mellitus with foot ulcer 03/16/2022 No Yes E11.42 Type 2 diabetes mellitus with diabetic polyneuropathy 03/16/2022 No Yes L97.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 03/16/2022 No Yes of skin [name], [name] ([id number]) [id number] Page 12 of 23 Z79.4 Long term (current) use of insulin 02/22/2023 No Yes Inactive Problems Resolved Problems Electronic Signature(s) Signed: 04/26/2023 4:13:50 PM By: Geralyn Corwin DO Entered By: Geralyn Corwin on 04/26/2023 12:17:43 -------------------------------------------------------------------------------- Progress Note Details Patient Name: Date of Service: [name] 04/26/2023 11:00 [name] Medical Record Number: [id number] Patient Account Number: [id number] Date of Birth/Sex: Treating RN: Mar 05, 1976  (47 y.o. Skip Mayer Primary Care Provider: [name] Other Clinician: [name] Referring Provider: Treating Provider/Extender: Chapman Moss in Treatment: 20 Subjective Chief Complaint Information obtained from Patient 03/19/2021; patient referred by Dr. Excell Seltzer who has been looking after her left foot for quite [name] period of time for review of [name] nonhealing area in the left midfoot 03/12/2022; bilateral feet wounds and right lower extremity wound. History of Present Illness (HPI) 01/18/18-She is here for initial evaluation of the left great toe ulcer. She is [name] poor historian in regards to timeframe in detail. She states approximately 4 weeks ago she lacerated her toe on something in the house. She followed up with her primary care who placed her on Bactrim and ultimately [name] second dose of Bactrim prior to coming to wound clinic. She states she has been treating the toe with peroxide, Betadine and [name] Band-Aid. She did not check her blood sugar this morning but checked it yesterday morning it was 327; she is unaware of [name] recent A1c and there are no current records. She saw Dr. she would've orthopedics last week for an old injury to the left ankle, she states he did not see her toe, nor did she bring it to his attention. She smokes approximately 1 pack cigarettes [name] day. Her social situation is concerning, she arrives this morning with her mother who appears extremely intoxicated/under the influence; her mother was asked to leave the room and  be monitored by the patient's grandmother. The patient's aunt then accompanied the patient and the room throughout the rest of the appointment. We had [name] lengthy discussion regarding the deleterious effects of uncontrolled hyperglycemia and smoking as it relates to wound healing and overall health. She was strongly encouraged to decrease her smoking and get her diabetes under better control. She states she is currently on [name] diet and  has cut down her [facility] consumption. The left toe is erythematous, macerated and slightly edematous with malodor present. The edema in her left foot is below her baseline, there is no erythema streaking. We will treat her with Santyl, doxycycline; we have ordered and xray, culture and provided [name] Peg assist surgical shoe and cultured the wound. 01/25/18-She is here in follow-up evaluation for [name] left great toe ulcer and presents with an abscess to her suprapubic area. She states her blood sugars remain elevated, feeling "sick" and if levels are below 250, but she is trying. She has made no attempt to decrease her smoking stating that we "can't take away her food in her cigarettes". She has been compliant with offloading using the PEG assist you. She is using Santyl daily. the culture obtained last week grew staph aureus and Enterococcus faecalis; continues on the doxycycline and Augmentin was added on Monday. The suprapubic area has erythema, no femoral variation, purple discoloration, minimal induration, was accessed with [name] cotton tip applicator with sanguinopurulent drainage, this was cultured, I suspect the current antibiotic treatment will cover and we will not add anything to her current treatment plan. She was advised to go to urgent care or ER with any change in redness, induration or fever. 02/01/18-She is here in follow-up evaluation for left great toe ulcers and [name] new abdominal abscess from last week. She was able to use packing until earlier this week, where she "forgot it was there". She states she was feeling ill with GI symptoms last week and was not taking her antibiotic. She states her glucose levels have been predominantly less than 200, with occasional levels between 200-250. She thinks this was contributing to her GI symptoms as they have resolved without intervention. There continues to be significant laceration to left toe, otherwise it clinically looks stable/improved. There is  now less superficial opening to the lateral aspect of the great toe that was residual blister. We will transition to [facility] to all wounds, she will continue her Augmentin. If there is no change or deterioration next week for reculture. 02/08/18-She is here in follow-up evaluation for left great toe ulcer and abdominal ulcer. There is an improvement in both wounds. She has been wrapping her left toe with coban, not by our direction, which has created an area of discoloration to the medial aspect; she has been advised to NOT use coban secondary to her neuropathy. She states her glucose levels have been high over this last week ranging from 200-350, she continues to smoke. She admits to being less compliant with her offloading shoe. We will continue with same treatment plan and she will follow-up next week. 02/15/18-She is here in follow-up evaluation for left great toe ulcer and abdominal ulcer. The abdominal ulcer is epithelialized. The left great toe ulcer is improved and all injury from last week using the Coban wrap is resolved, the lateral ulcer is healed. She admits to noncompliance with wearing offloading shoe and admits to glucose levels being greater than 300 most of the week. She continues to smoke and expresses no desire to  quit. There is one area medially that probes deeper than it has historically, erythema to the toe and dorsal foot has consistently waxed and waned. There is no overt signs of cellulitis or infection but we will culture the wound for any occult infection given the new area of depth and erythema. We will hold off on sensitivities for initiation of antibiotic [name], [name] ([id number]) [id number] Page 13 of 23 therapy. 02/22/18-She is here in follow up evaluation for left great toe ulcer. There is overall significant improvement in both wound appearance, erythema and edema with changes made last week. She was not initiated on antibiotic  therapy. Culture obtained last week showed oxacillin sensitive staph aureus, sensitive to clindamycin. Clindamycin has been called into the pharmacy but she has been instructed to hold off on initiation secondary to overall clinical improvement and her history of antibiotic intolerance. She has been instructed to contact the clinic with any noted changes/deterioration and the wound, erythema, edema and/or pain. She will follow-up next week. She continues to smoke and her glucose levels remain elevated >250; she admits to compliance with offloading shoe 03/01/18 on evaluation today patient appears to be doing fairly well in regard to her left first toe ulcer. She has been tolerating the dressing changes with the [facility] Dressing without complication and overall this has definitely showed signs of improvement according to records as well is what the patient tells me today. I'[name] very pleased in that regard. She is having no pain today 03/08/18 She is here for follow up evaluation of [name] left great toe ulcer. She remains non-compliant with glucose control and smoking cessation; glucose levels consistently >200. She states that she got new shoe inserts/peg assist. She admits to compliance with offloading. Since my last evaluation there is significant improvement. We will switch to prisma at this time and she will follow up next week. She is noted to be tachycardic at this appointment, heart rate 120s; she has [name] history of heart rate 70-130 according to our records. She admits to extreme agitation r/t personal issues; she was advised to monitor her heartrate and contact her physician if it does not return to [name] more normal range (<100). She takes cardizem twice daily. 03/15/18-She is here in follow-up evaluation for left great toe ulcer. She remains noncompliant with glucose control and smoking cessation. She admits to compliance with wearing offloading shoe. The ulcer is improved/stable and we will continue  with the same treatment plan and she will follow-up next week 03/22/18-She is here for evaluation for left great toe ulcer. There continues to be significant improvement despite recurrent hyperglycemia (over 500 yesterday) and she continues to smoke. She has been compliant with offloading and we will continue with same treatment plan and she will follow-up next week. 03/29/18-She is here for evaluation for left great toe ulcer. Despite continuing to smoke and uncontrolled diabetes she continues to improve. She is compliant with offloading shoe. We will continue with the same treatment plan and she will follow-up next week 04/05/18- She is here in follow up evaluation for [name] left great toe ulcer; she presents with small pustule to left fifth toe (resembles ant bite). She admits to compliance with wearing offloading shoe; continues to smoke or have uncontrolled blood glucose control. There is more callus than usual with evidence of bleeding; she denies known trauma. 04/12/18-She is here for evaluation of left great toe ulcer. Despite noncompliance with glycemic control and smoking she continues to make improvement. She continues to wear  offloading shoe. The pustule, that was identified last week, to the left fifth toe is resolved. She will follow-up in 2 weeks 05/03/18-she is seen in follow-up evaluation for [name] left great toe ulcer. She is compliant with offloading, otherwise noncompliant with glycemic control and smoking. She has plateaued and there is minimal improvement noted. We will transition to [facility], replaced the insert to her surgical shoe and she will follow-up in one week 05/10/18- She is here in follow up evaluation for [name] left great toe ulcer. It appears stable despite measurement change. We will continue with same treatment plan and follow up next week. 05/24/18-She is seen in follow-up evaluation for [name] left great toe ulcer. She remains compliant with offloading, has made significant  improvement in her diet, decreasing the amount of sugar/soda. She said her recent A1c was 10.9 which is lower than. She did see [name] diabetic nutritionist/educator yesterday. She continues to smoke. We will continue with the same treatment plan and she'll follow-up next week. 05/31/18- She is seen in follow-up evaluation for left great toe ulcer. She continues to remain compliant with offloading, continues to make improvement in her diet, increasing her water and decreasing the amount of sugar/soda. She does continue to smoke with no desire to quit. We will apply Prisma to the depth and Hydrofera Blue over. We have not received insurance authorization for oasis. She will follow up next week. 06/07/18-She is seen in follow-up evaluation for left great toe ulcer. It has stalled according to today's measurements although base appears stable. She says she saw [name] diabetic educator yesterday; her average blood sugars are less than 300 which is an improvement for her. She continues to smoke and states "that's my next step" She continues with water over soda. We will order for xray, culture and reinstate ace wrap compression prior to placing apligraf for next week. She is voicing no complaints or concerns. Her dressing will change to iodoflex over the next week in preparation for apligraf. 06/14/18-She is seen in follow-up evaluation for left great toe ulcer. Plain film x-ray performed last week was negative for osteomyelitis. Wound culture obtained last week grew strep [name] and OSSA; she is initiated on keflex and cefdinir today; there is erythema to the toe which could be from ace wrap compression, she has [name] history of wrapping too tight and has has been encouraged to maintain ace wraps that we place today. We will hold off on application of apligraf today, will apply next week after antibiotic therapy has been initiated. She admits today that she has resumed taking [name] shower with her foot/toe submerged in water, she  has been reminded to keep foot/toe out of the bath water. She will be seen in follow up next week 06/21/18-she is seen in follow-up evaluation for left great toe ulcer. She is tolerating antibiotic therapy with no GI disturbance. The wound is stable. Apligraf was applied today. She has been decreasing her smoking, only had 4 cigarettes yesterday and 1 today. She continues being more compliant in diabetic diet. She will follow-up next week for evaluation of site, if stable will remove at 2 weeks. 06/28/18- She is here in follow up evalution. Apligraf was placed last week, she states the dressing fell off on Tuesday and she was dressing with hydrofera blue. She is healed and will be discharged from the clinic today. She has been instructed to continue with smoking cessation, continue monitoring glucose levels, offloading for an additional 4 weeks and continue with hydrofera blue  for additional two weeks for any possible microscopic opening. Readmission: 08/07/18 on evaluation today patient presents for reevaluation concerning the ulcer of her right great toe. She was previously discharged on 06/28/18 healed. Nonetheless she states that this began to show signs of drainage she subsequently went to her primary care provider. Subsequently an x-ray was performed on 08/01/18 which was negative. The patient was also placed on antibiotics at that time. Fortunately they should have been effective for the infection. Nonetheless she's been experiencing some improvement but still has [name] lot of drainage coming from the wound itself. 08/14/18 on evaluation today patient's wound actually does show signs of improvement in regard to the erythema at this point. She has completed the antibiotics. With that being said we did discuss the possibility of placing her in [name] total contact cast as of today although I think that I may want to give this just [name] little bit more time to ensure nothing recurrence as far as her infection is  concerned. I do not want to put in the cast and risk infection at that time if things are not completely resolved. With that being said she is gonna require some debridement today. 08/21/18 on evaluation today patient actually appears to be doing okay in regard to her toe ulcer. She's been tolerating the dressing changes without complication. With that being said it does appear that she is ready and in fact I think it's appropriate for [place] to go ahead and initiate the total contact cast today. Nonetheless she will require some sharp debridement to prepare the wound for application. Overall I feel like things have been progressing well but we do need to do something to get this to close more readily. 08/24/18 patient seen today for reevaluation after having had the total contact cast applied on Tuesday. She seems to have done very well the wound appears to be doing great and overall I'[name] pleased with the progress that she's made. There were no abnormal areas of rubbing from the cast on her lower extremity. 08/30/18 on evaluation today patient actually appears to be completely healed in regard to her plantar toe ulcer. She tells me at this point she's been having [name] lot of issues with the cast. She almost fell [name] couple of times the state shall the step of her dog [name] couple times as well. This is been [name] very frustrating process for her other nonetheless she has completely healed the wound which is excellent news. Overall there does not appear to be the evidence of infection at this time which is great news. 09/11/18 evaluation today patient presents for follow-up concerning her great toe ulcer on the left which has unfortunately reopened since I last saw her which was only [name] couple of weeks ago. Unfortunately she was not able to get in to get the shoe and potentially the AFO that's gonna be necessary due to her left foot drop. She continues with offloading shoe but this is not enough to prevent her from  reopening it appears. When we last had her in the total contact cast she did well from [name] healing standpoint but unfortunately the wound reopened as soon as she came out of the cast within just [name] couple of weeks. 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 This is also part of the reason why she falls on [name] regular basis. I just do not believe that is gonna be safe for [place] to reinitiate the total contact cast as last time we had this on she fell 3 times one week which is definitely not normal for her. 09/18/18 upon evaluation today the patient actually appears to be doing about the same in regard to her toe ulcer. She did not contact [facility] as I asked her to even though I had given her the prescription. In fact she actually states that she has no idea where the prescription is. She did apparently call [employer] and they told her that all she needed to do was bring the prescription in order to be able to be seen and work on getting the AFO for her left foot. With all that being said she still does not have an appointment and I'[name] not sure were things stand that regard. I will give her [name] new prescription today in order to contact them to get this set up. [name], [name] ([id number]) [id number] Page 14 of 23 09/25/18 on evaluation today patient actually appears to be doing about the same in regard to her toes ulcer. She does have [name] small areas which seems to have [name] lot of callous buildup around the edge of the wound which is going to need sharp debridement today. She still is waiting to be scheduled for evaluation with [facility] for possibility of an AFO. She states there supposed to call her tomorrow to get this set up. Unfortunately it does appear that her foot specifically the toe area  is showing signs of erythema. There does not appear to be any systemic infection which is in these good news. 10/02/18 on evaluation today patient actually appears to be doing about the same in regard to her toe ulcer. This really has not done too well although it's not significantly larger it's also not significantly smaller. She has been tolerating the dressing changes without complication. She actually has her appointment with [facility] and [facility] tomorrow to hopefully be measured for obtaining and AFO splint. I think this would be helpful preventing this from reoccurring. We had contemplated starting the cast this week although to be honest I am reluctant to do that as she's been having nausea, vomiting, and seizure activity over the past three days. She has [name] history of seizures and have been told is nothing that can be done for these. With that being said I do believe that along with the seizures have the nausea vomiting which upon further questioning doesn't seem to be the normal for her and makes me concerned for the possibility of infection or something else going on. I discussed this with the patient and her mother during the office visit today. I do not feel the wound is effective but maybe something else. The responses this was "this just happens to her at times and we don't know why". They did not seem to be interested in going to the hospital to have this checked out further. 10/09/18 on evaluation today patient presents for follow-up concerning her ongoing toe ulcer. She has been tolerating the dressing changes without complication. Fortunately there does not appear to be any evidence of infection which is great news however I do think that the patient would benefit from going ahead for with the total contact cast. She's actually in [name] wheelchair today she tells me that she will use her walker if we initiate the cast.  I was very specific about the fact that if we were gonna do the cast I  wanted to make sure that she was using the walker in order to prevent any falls. She tells me she does not have stairs that she has to traverse on [name] regular basis at her home. She has not had any seizures since last week again that something that happens to her often she tells me she did talk to [employer] and they said that it may take up to three weeks to get the brace approved for her. Hopefully that will not take that long but nonetheless in the meantime I do think the cast could be of benefit. 10/12/18 on evaluation today patient appears to be doing rather well in regard to her toe ulcer. It's just been [name] few days and already this is significantly improved both as far as overall appearance and size. Fortunately there's no sign of infection. She is here for her first obligatory cast change. 10/19/18 Seen today for follow up and management of left great toe ulcer. Wound continues to show improvement. Noted small open area with seroussang drainage with palpation. Denies any increased pain or recent fevers during visit. She will continue calcium alginate with offloading shoe. Denies any questions or concerns during visit. 10/26/18 on evaluation today patient appears to be doing about the same as when I last saw her in regard to her wound bed. Fortunately there does not appear to be any signs of infection. Unfortunately she continues to have [name] breakdown in regard to the toe region any time that she is not in the cast. It takes almost no time at all for this to happen. Nonetheless she still has not heard anything from the brace being made by [employer] as to when exactly this will be available to her. Fortunately there is no signs of infection at this time. 10/30/18 on evaluation today patient presents for application of the total contact cast as we just received him this morning. Fortunately we are gonna be able to apply this to her today which is great news. She continues to have no significant pain which is  good news. Overall I do feel like things have been improving while she was the cast is when she doesn't have [name] cast that things get worse. She still has not really heard anything from [employer] regarding her brace. 11/02/18 upon evaluation today patient's wound already appears to be doing significantly better which is good news. Fortunately there does not appear to be any signs of infection also good news. Overall I do think the total contact cast as before is helping to heal this area unfortunately it's just not gonna likely keep the area closed and healed without her getting her brace at least. Again the foot drop is [name] significant issue for her. 11/09/18 on evaluation today patient appears to be doing excellent in regard to her toe ulcer which in fact is completely healed. Fortunately we finally got the situation squared away with the paperwork which was needed to proceed with getting her brace approved by Medicaid. I have filled that out unfortunately that information has been sent to the orthopedic office that I worked at 2 1/2 years ago and not tired Current wound care measures. Fortunately she seems to be doing very well at this time. 11/23/18 on evaluation today patient appears to be doing More Poorly Compared to Last Time I Saw Her. At [facility] She Had Completely Healed. Currently she is continuing to have  issues with reopening. She states that she just found out that the brace was approved through Medicaid now she just has to go get measured in order to have this fitted for her and then made. Subsequently she does not have an appointment for this yet that is going to complicate things we obviously cannot put her back in the cast if we do not have everything measured because they're not gonna be able to measure her foot while she is in the cast. 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  Obviously this is unfortunate and does have me somewhat worried as well. 11/30/18 on evaluation today patient's toe ulcer actually appears to be doing fairly well. The good news is she will be getting her brace in the shoes next week on Wednesday. Hopefully we will be able to get this to heal without having to go back in the cast however she may need the cast in order to get the wound completely heal and then go from there. Fortunately there's no signs of infection at this time. 12/07/18 on evaluation today patient fortunately did receive her brace and she states she could tell this definitely makes her walk better. With that being said she's been having issues with her toe where she noticed yesterday there was [name] lot of tissue that was loosing off this appears to be much larger than what it was previous. She also states that her leg has been read putting much across the top of her foot just about the ankle although this seems to be receiving somewhat. The total area is still red and appears to be someone infected as best I can tell. She is previously taken Bactrim and that may be [name] good option for her today as well. We are gonna see what I wound culture shows as well and I think that this is definitely appropriate. With that being said outside of the culture I still need to initiate something in the interim and that's what I'[name] gonna go ahead and select Bactrim is [name] good option for her. 12/14/18 on evaluation today patient appears to be doing better in regard to her left great toe ulcer as compared to last week's evaluation. There's still some erythema although this is significantly improved which is excellent news. Overall I do believe that she is making good progress is still gonna take some time before she is where I would like her to be from the standpoint of being able to place her back into the total contact cast. Hopefully we will be where we need to be by next week. 12/21/18 on evaluation today  patient actually appears to be doing poorly in regard to her toe ulcer. She's been tolerating the dressing changes without complication. Fortunately there's no signs of systemic infection although she does have [name] lot of drainage from the toe ulcer and this does seem to be causing some issues at this point. She does have erythema on the distal portion of her toe that appears to be likely cellulitis. 12/28/18 on evaluation today patient actually appears to be doing [name] little better in my pinion in regard to her toe ulcer. With that being said she still does have some evidence of infection at this time and for her culture she had both E. coli as well as enterococcus as organisms noted on evaluation. For that reason I think that though the  Keflex likely has treated the E. coli rather well this has really done nothing for the enterococcus. We are going to have to initiate treatment for this specifically. 01/04/19 on evaluation today patient's toe actually appears to be doing better from the standpoint of infection. She currently would like to see about putting the cash back on I think that this is appropriate as long as she takes care of it and keeps it from getting wet. She is gonna have some drainage we can definitely pass this up with Drawtex and alginate to try to prevent as much drainage as possible from causing the problems. With that being said I do want to at least try her with the cast between now and Tuesday. If there any issues we can't continue to use it then I will discontinue the use of the cast at that point. 01/08/19 on evaluation today patient actually appears to be doing very well as far as her foot ulcer specifically the great toe on the left is concerned. She did have an area of rubbing on the medial aspect of her left ankle which again is from the cast. Fortunately there's no signs of infection at this point in this appears to be [name] very slight skin breakdown. The patient tells me she felt it  rubbing but didn't think it was that bad. Fortunately there is no signs of active infection at this time which is good news. No fevers, chills, nausea, or vomiting noted at this time. 01/15/19 on evaluation today patient actually appears to be doing well in regard to her toe ulcer. Again as previous she seems to do well and she has the cast on [employer] [name] ([id number]) [id number] Page 15 of 23 which indicates to me that during the time she doesn't have [name] cast on she's putting way too much pressure on this region. Obviously I think that's gonna be an issue as with the current national emergency concerning the Covid-19 Virus it has been recommended that we discontinue the use of total contact casting by the chief medical officer of our company, Dr. Maurine Minister. The reasoning is that if [name] patient becomes sick and cannot come into have the cast removed they could not just leave this on for an additional two weeks. Obviously the hospitals also do not want to receive patient's who are sick into the emergency department to potentially contaminate the region and spread the Covid-19 Virus among other sick individuals within the hospital system. Therefore at this point we are suspending the use of total contact cast until the current emergency subsides. This was all discussed with the patient today as well. 01/22/19 on evaluation today patient's wound on her left great toe appears to be doing slightly worse than previously noted last week. She tells me that she has been on this quite [name] bit in fact she tells me she's been awake for 38 straight hours. This is due to the fact that she's having to care for grandparents because nobody else will. She has been taking care of them for five the last seven days since I've seen her they both have dementia his is from [name] stroke and her grandmother's was progressive. Nonetheless she states even her mom who knows her condition and situation has only  help two of those days to take care of them she's been taking care of the rest. Fortunately there does not appear to be any signs of active infection in regard to her toe at this point although obviously it  doesn't look as good as it did previous. I think this is directly related to her not taking off the pressure and friction by way of taking things easy. Though I completely understand what's going on. 01/29/19 on evaluation today patient's tools are actually appears to be showing some signs of improvement today compared to last week's evaluation as far as not necessarily the overall size of the wound but the fact that she has some new skin growth in between the two ends of the wound opening. Overall I feel like she has done well she states that she had [name] family member give her what sounds to be [name] CAM walker boot which has been helpful as well. 02/05/19 on evaluation today patient's wound bed actually appears to be doing significantly better in regard to her overall appearance of the size of the wound. With that being said she is still having an issue with offloading efficiently enough to get this to close. Apparently there is some signs of infection at this point as well unfortunately. Previously she's done well of Augmentin I really do not see anything that needs to be culture currently but there theme and cellulitis of the foot that I'[name] seeing I'[name] gonna go ahead and place her on an antibiotic today to try to help clear this up. 02/12/2019 on evaluation today patient actually appears to be doing poorly in regard to her overall wound status. She tells me she has been using her offloading shoe but actually comes in today wearing her tennis shoe with the AFO brace. Again as I previously discussed with her this is really not sufficient to allow the area to heal appropriately. Nonetheless she continues to be somewhat noncompliant and I do wonder based on what she has told my nurse in the past as to whether  or not [name] good portion of this noncompliance may be recreational drug and alcohol related. She has had [name] history of heroin overdose and this was fairly recently in the past couple of months that have been seeing her. Nonetheless overall I feel like her wound looks significantly worse today compared to what it was previous. She still has significant erythema despite the Augmentin I am not sure that this is an appropriate medication for her infection I am also concerned that the infection may have gone down into her bone. 02/19/19 on evaluation today patient actually appears to be doing about the same in regard to her toe ulcer. Unfortunately she continues to show signs of bone exposure and infection at this point. There does not appear to be any evidence of worsening of the infection but I'[name] also not really sure that it's getting significantly better. She is on the Augmentin which should be sufficient for the Staphylococcus aureus infection that she has at this point. With that being said she may need IV antibiotics to more appropriately treat this. We did have [name] discussion today about hyperbaric option therapy. 02/28/19 on evaluation today patient actually appears to be doing much worse in regard to the wound on her left great toe as compared to even my previous evaluation last week. Unfortunately this seems to be training in [name] pretty poor direction. Her toe was actually now starting to angle laterally and I can actually see the entire joint area of the proximal portion of the digit where is the distal portion of the digit again is no longer even in contact with the joint line. Unfortunately there's [name] lot more necrotic tissue around the edge and  the toe appears to be showing signs of becoming gangrenous in my pinion. I'[name] very concerned about were things stand at this point. She did see infectious disease and they are planning to send in [name] prescription for Sivextro for her and apparently this has been  approved. With that being said I don't think she should avoid taking this but at the same time I'[name] not sure that it's gonna be sufficient to save her toe at this point. She tells me that she still having to care for grandparents which I think is putting quite [name] bit of strain on her foot and specifically the total area and has caused this to break down even to [name] greater degree than would've otherwise been expected. 03/05/19 on evaluation today patient actually appears to be doing quite well in regard to her toe all things considering. She still has bone exposed but there appears to be much less your thing on overall the appearance of the wound and the toe itself is dramatically improved. She still does have some issues currently obviously with infection she did see vascular as well and there concerned that her blood flow to the toad. For that reason they are setting up for an angiogram next week. 03/14/19 on evaluation today patient appears to be doing very poor in regard to her toe and specifically in regard to the ulceration and the fact that she's starting to notice the toe was leaning even more towards the lateral aspect and the complete joint is visible on the proximal aspect of the joint. Nonetheless she's also noted [name] significant odor and the tip of the toe is turning more dark and necrotic appearing. Overall I think she is getting worse not better as far as this is concerned. For that reason I am recommending at this point that she likely needs to be seen for likely amputation. READMISSION 03/19/2021 This is [name] patient that we cared for in this clinic for [name] prolonged period of time in 2019 and 2020 with [name] left foot and left first toe wound. 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 Excell Seltzer. In December 2021 she had an ulcer on her right great toe as well as the fourth and fifth  toes. 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 She also had an angiogram at that time and underwent angioplasty of the right anterior tibial artery. In any case she claims that the wound on the right foot is closed I did not look at this today which was probably an oversight although I think that should be done next week. 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 According to Dr. Bernette Redbird last review that she was out of the area being cared for by another physician but recently came back to his attention. 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 [name] culture of this area showed E. coli apparently before she came back to see Dr. Excell Seltzer she was supposed to be receiving antibiotics but she did not really take them. Nor is she offloading this area at all. Finally her last hemoglobin A1c listed in epic was in March 2022 at 14.1 she says things are [name] lot better since then although I am not sure. 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 She had this wound at the time. During this admission she had E. coli sepsis [name] TEE was negative. 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 She is still [name] heavy cigarette smoker. She has [name] history of bipolar disorder seizure disorder ABI in our clinic was 1.05 6/1; left midfoot in the setting of [name] TMA done previously. Round circular wound with [name] "knuckle" of protruding tissue. The problem is that the knuckle was not attached to any of the surrounding granulation and this probed proximally widely I removed [name] large portion of this tissue. This wound  goes with considerable undermining laterally. I do not feel any bone there was no purulence but this is [name] deep wound. 6/8; in spite of the debridement I did last week. She arrives with [name] wound looking exactly the same. [name] protruding "knuckle" of tissue nonadherent to most of the surrounding tissue. There is considerable depth around this from 6-12 o'clock at 2.7 cm and undermining of 1 cm. This does not look overtly infected and the x- ray I did last week was negative for any osseous abnormalities. We have been using silver collagen 6/15; deep tissue culture I did last week showed moderate staph aureus and moderate Pseudomonas. This will definitely require prolonged antibiotic therapy. The pathology on the protuberant area was negative for malignancy fungus etc. 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 We have been using silver collagen. I am going to be prescribing Levaquin for 2 weeks. Her CT scan of the foot is down for 7/5 6/22; CT scan of the foot on 7 5. She says she has hardware in the left leg from her previous fracture. She is on the Levaquin for the deep tissue culture I did that showed methicillin sensitive staph aureus and Pseudomonas. I gave her [name] 2-week supply and she will have another week. She arrives in clinic today with the same protuberant tissue however this is nonadherent to the tissue surrounding it. I am really at [name] loss to explain this unless there is underlying deep tissue infection DEDRIA, [employer]DR[employer] [name] ([id number]) [phone].pdf Page 16 of 23 6/29; patient presents for 1 week follow-up. She has been using collagen to the wound bed. She reports taking her antibiotics as prescribed.She has no complaints or issues today. She denies signs of infection. 7/6; patient presents for one week followup. She has been using collagen to the wound bed. She states she is taking Levaquin however at times she is  not able to keep it down. She denies signs of infection. 7/13; patient presents for 1 week follow-up. She has been using silver alginate to the wound bed. She still has nausea when taking her antibiotics. She denies signs of infection. 7/20; patient presents for 1 week follow-up. She has been using silver alginate with gentamicin cream to the wound bed. She denies any issues and has no complaints today. She denies signs of infection. 7/27; patient presents for 1 week follow-up. She continues to use silver alginate with gentamicin cream to the wound bed. She reports starting her antibiotics. She has no issues or complaints. Overall she reports stability to the wound. 8/3; patient presents for 1 week follow-up. She has been using silver alginate with gentamicin cream to the wound  bed. She reports completing all antibiotics. She has no issues or complaints today. She denies signs of infection. 8/17; patient presents for 2-week follow-up. He is to use silver alginate to the wound bed. She has no issues or complaints today. She denies signs of infection. She reports her pain has improved in her foot since last clinic visit 8/24; patient presents for 1 week follow-up. She continues to use silver alginate to the wound bed. She has no issues or complaints. She denies signs of infection. Pain is stable. 9/7; patient presents for follow-up. She missed her last week appointment due to feeling ill. She continues to use silver alginate. She has [name] new wound to the right lower extremity that is covered in eschar. She states It occurred over the past week and has no idea how it started. She currently denies signs of infection. 9/14; patient presents for follow-up. T the left foot wound she has been using gentamicin cream and silver alginate. T the right lower extremity wound she has o o been keeping this covered and has not obtain Santyl. 9/21; patient presents for follow-up. She reports using gentamicin cream  and silver alginate to the left foot and Santyl to the right lower extremity wound. She has no issues or complaints today. She denies signs of infection. 9/28; patient presents for follow-up. She reports [name] new wound to her right heel. She states this occurred [name] few days ago and is progressively gotten worse. She has been trying to clean the area with [name] Q-tip and Santyl. She reports stability in the other 2 wounds. She has been using gentamicin cream and silver alginate to the left foot and Santyl to the right lower extremity wound. 10/12; patient presents for follow-up. She reports improvement to the wound beds. She is seeing vein and vascular to discuss the potential of [name] left BKA. She states they are going to do an arteriogram. She continues to use silver alginate with dressing changes to her wounds. 11/2; patient presents for follow-up. She states she has not been doing dressing changes to the wound beds. She states she is not able to offload the areas. She reports chronic pain to her left foot wound. 11/9; patient presents for follow-up. She came in with only socks on. She states she forgot to put on shoes. It is unclear if she is doing any dressing changes. She currently denies systemic signs of infection. 11/16; patient presents for follow-up. She came again only with socks on. She states she does not wear shoes ever. It is unclear if she does dressing changes. She currently denies systemic signs of infection. 11/23; patient presents for follow-up. She wore her shoes today. It still unclear exactly what dressing she is using for each wound but she did states she obtained Dakin's solution and has been using this to the left foot wound. She currently denies signs of infection. 11/30; patient presents for follow-up. She has no issues or complaints today. She currently denies signs of infection. 12/7; patient presents for follow-up. She has no issues or complaints today. She has been using  Hydrofera Blue to the right heel wound and Dakin solution to the left foot wound. Her right anterior leg wound is healed. She currently denies signs of infection. 12/14; patient presents for follow-up. She has been using Hydrofera Blue to the right heel and Dakin's to the left foot wounds. She has no issues or complaints today. She denies signs of infection. 12/21; patient presents for follow-up. She reports using Hydrofera  Blue to the right heel and Dakin's to the left foot wound. She denies signs of infection. 12/28; patient presents for follow-up. She continues to use Dakin's to the left foot wound and Hydrofera Blue to the right heel wound. She denies signs of infection. 1/4; patient presents for follow-up. She has no issues or complaints today. She denies signs of infection. 1/11; patient presents for follow-up. It is unclear if she has been dressing these wounds over the past week. She currently denies signs of infection. 1/18; patient presents for follow-up. She states she has been using Dakin's wet-to-dry dressings to the left foot. She has been using Hydrofera Blue to the right foot foot wound. She states that the anterior right leg wound has reopened and draining serous fluid. She denies signs of infection. 1/25; patient presents for follow-up. She has no issues or complaints today. 2/1; patient presents for follow-up. She has no issues or complaints today. She denies signs of infection. 2/8; patient presents for follow-up. She has lost her surgical shoes. She did not have [name] dressing to the right heel wound. She currently denies signs of infection. 2/15; patient presents for follow-up. She reports more pain to the right heel today. She denies purulent drainage Or fever/chills 2/22; patient presents for follow-up. She reports taking clindamycin over the past week. She states that she continues to have pain to her right heel. She reports purulent drainage. Readmission 03/16/2022 Ms. Cassandr Cederberg is [name] 47 year old female with [name] past medical history of type 2 diabetes, osteomyelitis to her feet, chronic systolic heart failure and bipolar disorder that presents to the clinic for bilateral feet wounds and right lower extremity wound. She was last seen in our clinic on 12/15/2021. At that time she had purulent drainage coming out of her right plantar foot and I recommended she go to the ED. She states she went to [facility] and has been there for the past 3 months. I cannot see the records. She states she had OR debridement and was on several weeks of IV antibiotics while inpatient. Since discharge she has not been taking care of the wound beds. She had nothing on her feet other than socks today. She currently denies signs of infection. 5/31; patient presents for follow-up. She has been using Dakin's wet-to-dry dressings to the wound beds on her feet bilaterally and antibiotic ointment to the right anterior leg wound. She had [name] wound culture done at last clinic visit that showed moderate Pseudomonas aeruginosa sensitive to ciprofloxacin. She currently denies systemic signs of infection. 6/14; patient presents for follow-up. She received Keystone 5 days ago and has been using this on the wound beds. She states that last week she had to go to the hospital because she had increased warmth and erythema to the right foot. She was started on 2 oral antibiotics. She states she has been taking these. She currently denies systemic signs of infection. She has no issues or complaints today. 6/21; patient presents for follow-up. She states she has been using Keystone antibiotics to the wound beds. She has no issues or complaints today. She denies signs of infection. BAILI, STANG [name] ([id number]) [id number] Page 17 of 23 6/28; patient presents for follow-up. She has been using Keystone antibiotics to the wound beds. She has no issues or complaints today. 7/12;  patient presents for follow-up. Has been using Keystone antibiotics to the wound beds with calcium alginate. She has no issues or complaints today. She never followed up with her orthopedic  surgeon who did the OR debridement to the right foot. We discussed the total contact cast for the left foot and patient would like to do this next week. 7/19; patient presents for follow-up. She has been using Keystone antibiotics with calcium alginate to the wound beds. She has no issues or complaints today. Patient is in agreement to do the total contact cast of the left foot today. She knows to return later this week for the obligatory cast change. 05-13-2022 upon evaluation today patient's wound which she has the cast of the left leg actually appears to be doing significantly better. Fortunately I do not see any signs of active infection locally or systemically which is great news and overall I am extremely pleased with where we stand currently. 7/26; patient presents for follow-up. She has [name] cast in place for the past week. She states it irritated her shin. Other than that she tolerated the cast well. She states she would like [name] break for 1 week from the cast. We have been using Keystone antibiotic and Aquacel to both wound beds. She denies signs of infection. 8/2; patient presents for follow-up. She has been using Keystone and Aquacel to the wound beds. She denies any issues and has no complaints. She is agreeable to have the cast placed today for the left leg. 06-03-2022 upon evaluation today patient appears to be doing well with regard to her wound she saw some signs of improvement which is great news. Fortunately I do not see any evidence of active infection locally or systemically at this time which is great news. No fevers, chills, nausea, vomiting, or diarrhea. 8/16; patient presents for follow-up. She has no issues or complaints today. We have been using Keystone and Aquacel to the wound beds. The left  lower extremity is in [name] total contact cast. She is tolerated this well. 8/23; patient presents for follow-up. She has had the total contact cast on the left leg for the past week. Unfortunately this has rubbed and broken down the skin to the medial foot. She currently denies signs of infection. She has been using Keystone antibiotic to the right plantar foot wound. 8/30; patient presents for follow-up. We have held off on the total contact cast for the left leg for the past week. Her wound on the left foot has improved and the previous surrounding breakdown of skin has epithelialized. She has been using Keystone antibiotic to both wound beds. She has no issues or complaints today. She denies signs of infection. 9/6; patient presents for follow-up. She has ordered her's Keystone antibiotic and this is arriving this week. She has been doing Dakin's wet-to-dry dressings to the wound beds. She denies signs of infection. She is agreeable to the total contact cast today. 9/13; patient presents for follow-up. She states that the cast caused her left leg shin to be sore. She would like to take [name] break from the cast this week. She has been using Keystone antibiotic to the right plantar foot wound. She denies signs of infection. 9/20; patient presents for follow-up. She has been using Keystone antibiotics to the wound beds with calcium alginate to the right foot wound and Hydrofera Blue to the left foot wound. She is agreeable to having the cast placed today. She has been approved for Apligraf and we will order this for next clinic visit. 9/27; patient presents for follow-up. We have been using Keystone antibiotic with Hydrofera Blue to the left foot wound under [name] total contact cast. T the  right o foot wound she has been using Keystone antibiotic and calcium alginate. She declines [name] total contact cast today. Apligraf is available for placement and she would like to proceed with this. 07-28-2022 upon evaluation  today patient appears to be doing well currently in regard to her wound. She is actually showing signs of significant improvement which is great news. Fortunately I do not see any evidence of active infection locally nor systemically at this time. She has been seeing Dr. Mikey Bussing and to be honest has been doing very well with the cast. Subsequently she comes in today with [name] cast on and we did reapply that today as well. She did not really want to she try to talk me out of that but I explained that if she wanted to heal this is really the right way to go. Patient voiced understanding. In regard to her right foot this is actually [name] lot better compared to the last time I saw her which is also great news. 10/11; patient presents for follow-up. Apligraf and the total contact cast was placed to the left leg at last clinic visit. She states that her right foot wound had burning pain to it with the placement of Apligraf to this area. She has been doing [place] over this area. She denies signs of infection including increased warmth, erythema or purulent drainage. 11/1; 3-week follow-up. The patient fortunately did not have [name] total contact cast or an Apligraf and on the left foot. She has been using Keystone ABD pads and kerlix and her own running shoes She arrives in clinic today with thick callus and [name] very poor surface on the left foot on the right nonviable skin subcutaneous tissue and [name] deep probing hole. 11/15; patient missed her last clinic appointment. She states she has not been dressing the wound beds for the past 2 weeks. She states that at she had [name] new roommate but is now going back to live with her mother. Apparently its been [name] distracting 2 weeks. Patient currently denies signs of infection. 11/22; patient presents for follow-up. She states she has been using Keystone antibiotic and Dakin's wet-to-dry dressings to the wound beds. She is agreeable for cast placement today. We had ordered Apligraf  however this has not been received by our facility. 11/29; Patient had [name] total contact cast placed at last clinic visit and she tolerated this well. We were using silver alginate under the cast. Patient's been using Keystone antibiotic with Aquacel to the right plantar foot wound. She has no issues or complaints today. Apligraf is available for placement today. Patient would like to proceed with this. 12/6; patient presents for follow-up. She had Apligraf placed in standard fashion last clinic visit under the total contact cast to the left lower extremity. She has been using Keystone antibiotic and Aquacel to the right plantar foot wound. She has no issues or complaints today. 12/13; patient presents for follow-up. She has finished 5 Apligraf placements. Was told she would not qualify for more. We have been doing [name] total contact cast to the left lower extremity. She has been using Keystone antibiotic and Aquacel to the right plantar foot wound. She has no issues or complaints today. 12/20; patient presents for follow-up. We have been using Hydrofera Blue with Keystone antibiotic under [name] total contact cast of the left lower extremity. She reports using Keystone antibiotic and silver alginate to the right heel wound. She has no issues or complaints today. 12/27; patient presents with [name] healthy  wound on the left midfoot. We have Apligraf to apply that to that more also using [name] total contact cast. On the right we are using Keystone and silver alginate. She is offloading the right heel with [name] surgical shoealthough by her admission she is on her feet quite [name] bit 1/3; patient presents for follow-up. Apligraf was placed to the wound beds last clinic visit. She was placed in [name] total contact cast to the left lower extremity. She declines [name] total contact cast today. She states that her mother is in the hospital and she cannot adequately get around with the cast on. 1/10; patient presents for follow-up. She  declined the total contact cast at last clinic visit. Both wounds have declined in appearance. She states that she has been on her feet and not offloading the wound beds. She currently denies signs of infection. 1/17; patient presents for follow-up. She had the total contact cast along with Apligraf placed last week to the left lower extremity. She tolerated this well. She has been using Aquacel Ag and Keystone antibiotic to the right heel wound. She currently denies signs of infection. She has no issues or complaints today. DYLANA, SHAW [name] ([id number]) [id number] Page 18 of 23 1/24; patient presents for follow-up. We have been using Apligraf to the left foot wound along with [name] total contact cast. She has done well with this. T the right o heel wound she has been using Aquacel Ag and Keystone antibiotic ointment. She has no issues or complaints today. She denies signs of infection. 1/31; patient presents for follow-up. We have been using Apligraf to the left foot wound along with the total contact cast. She continues to do well with this. To the right heel we have been using Aquacel Ag and Keystone antibiotic ointment. She has no issues or complaints today. 12-01-2022 upon evaluation patient is seen today on my schedule due to the fact that she unfortunately was in the hospital yesterday. Her cast needed to come off the only reason she is out of the hospital is due to the fact that they would not take it off at the hospital which is somewhat bewildered reading to me to be perfectly honest. I am not certain why this was but either way she was released and then was placed on my schedule today in order to get this off and reapply the total contact casting as appropriate. I do not have an Apligraf for her today it was applied last week and today's actually expired yesterday as there was some scheduling conflicts with her being in the hospital. Nonetheless we do not have that for  reapplication today but the good news that she is not draining too much and the Apligraf can go for up to 2 weeks so I am going to go ahead and reapply the total contact casting but we are going to leave the Apligraf in place. 2/14; patient presents for follow-up. T the left leg she has had the total contact cast and Apligraf for the past week. She has had no issues with this. T the o o right heel she has been using antibiotic ointment and Aquacel Ag. 2/21; patient presents for follow-up. We have been using Apligraf and [name] total contact cast to the left lower extremity. She is tolerated this well. Unfortunately she is not approved for any more Apligraf per insurance. She has been using antibiotic ointment and Aquacel Ag to the right foot. She has no issues or complaints today. 2/28; patient presents  for follow-up. We have been using Hydrofera Blue and antibiotic ointment under the total contact cast to the left lower extremity. He has been using Aquacel Ag with antibiotic ointment to the right plantar foot. She has no issues or complaints today. 3/6; patient presents for follow-up. She did not obtain her gentamicin ointment. She has been using Aquacel Ag to the right plantar foot wound. We have been using Hydrofera Blue with antibiotic ointment under the total contact cast to the left lower extremity. She has no issues or complaints today. 3/12; patient presents for follow-up. She has been using gentamicin ointment and Aquacel Ag to the right plantar foot wound. We have been using Hydrofera Blue with antibiotic ointment under the total contact cast on the left lower extremity foot wound. She has no issues or complaints today. 3/20; patient presents for follow-up. She has been using gentamicin ointment and Aquacel Ag to the right plantar foot wound. We have been using Hydrofera Blue with antibiotic ointment under the total contact cast to the left lower extremity wound. She has no issues or complaints  today. 3/27; patient presents for follow-up. We have been using antibiotic ointment and Aquacel Ag to the right plantar foot wound. We have been using Hydrofera Blue with antibiotic ointment under the total contact cast to the left lower extremity. Both wounds are smaller. 01-24-2023 upon evaluation today patient actually appears to be doing better compared to last time I saw her. It has been several weeks since I last saw her but nonetheless I think the total contact casting is doing [name] good job. Fortunately I do not see any evidence of infection or worsening overall which is great news and in general I do believe that she is moving in the appropriate direction. It has been [name] very long road but nonetheless I feel like she is nearing the end at least in regard to the left foot and even the right foot looks much better to be perfectly honest. 01-31-2023 upon evaluation today patient appears to be doing well currently in regard to her wounds. She has been require some sharp debridement which I think is probably bone both sides to remove [name] lot of callus that has built up at this point. I discussed that with her today. Also think total contact cast is doing well for the left foot Emina continue that as well. 4/17; patient presents for follow-up. We have been doing Hydrofera Blue with antibiotic ointment under the total contact cast to the left lower extremity and Aquacel Ag with antibiotic ointment to the right foot wound. She has been using her offloading heel shoe here. 4/24; patient presents for follow-up. We have been doing Hydrofera Blue with antibiotic ointment under the total contact cast to the left lower extremity and Aquacel Ag with antibiotic ointment to the right foot wound. She has been using her offloading heel shoe here. Wounds are smaller with slough accumulation. 5/1; patient presents for follow-up. We have been doing Hydrofera Blue with antibiotic ointment under the total contact cast to the  left lower extremity and Aquacel Ag with antibiotic ointment to the right foot wound. Left foot wound is stable and right foot wound appears smaller. 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 On the left she is using Hydrofera Blue topical antibiotic under [name] total contact cast, on the right plantar heel gentamicin Aquacel Ag. According to our intake nurse both are making slow but steady improvements. 5/15; patient presents for follow-up. We have been doing [employer]	[place]  with antibiotic ointment under the total contact cast on the left and antibiotic ointment with Aquacel Ag on the right. Unfortunately the left foot wound has declined in size and appearance. We also do not have the total contact cast available in office today. Patient denies signs of infection. 5/22; patient presents for follow-up. We have been doing Hydrofera Blue and antibiotic ointment to the left foot and Aquacel Ag with antibiotic ointment to the right foot. We took [name] break from the cast since we did not have it placed at last week. Wounds look stable if not slightly improved. 03-27-2023 patient presents today she has had this For Most 2 Weeks Because She Was in the Hospital and They Did Not T It off. Again That Is Really Not ake Something That She Could Control but Nonetheless We Are Seeing Her at This Point Today for Reevaluation. We Do Want to Go and See about Reapply the Cast. Again We Had All Set Everything up to Go Ahead and Do This When We Went to Get the Cast Only to Realize That Not Had Come in and We Were out at the Moment. With That Being [place] We Will Get Have T Get the Exam before We Can Reapply the T Contact Casting Going Forward. o otal 6/12; patient presents for follow-up. She has been using Hydrofera Blue to the left foot wound and Aquacel Ag to the right foot wound. No cast was available last week placed on the patient so she has been offloading with her surgical shoe. She has no issues or complaints  today. 6/19; patient presents for follow-up. We have been using Hydrofera Blue to the left foot wound under the total contact cast and Aquacel Ag to the right wound. She has no issues or complaints today. The wounds are smaller. 6/26; patient presents for follow-up. We have been using Hydrofera Blue to the left foot wound under the total contact cast and Aquacel Ag to the right foot wound. Wounds are smaller. Patient has no issues or complaints today. 7/3; patient presents for follow-up. We have been using Hydrofera Blue to the left foot wound under the total contact cast and Aquacel Ag to the right foot wound. Wounds Appear smaller. No signs of infection. Objective Constitutional [name], [name] ([id number]) [id number] Page 19 of 23 Vitals Time Taken: 11:07 AM, Height: 69 in, Weight: 178 lbs, BMI: 26.3, Temperature: 98.5 F, Pulse: 66 bpm, Respiratory Rate: 18 breaths/min, Blood Pressure: 129/82 mmHg. General Notes: Right foot: T the plantar heel there is an incision site with increased depth, No probing to bone. T the opening there is slough and granulation o o tissue Left foot: T the medial aspect there is an open wound with granulation tissue, Callus and slough build up. No overt signs of infection to any of the o wound beds. Integumentary (Hair, Skin) Wound #11 status is Open. Original cause of wound was Surgical Injury. The date acquired was: 12/01/2021. The wound has been in treatment 58 weeks. The wound is located on the Right,Plantar Foot. The wound measures 0.3cm length x 0.3cm width x 0.4cm depth; 0.071cm^2 area and 0.028cm^3 volume. There is Fat Layer (Subcutaneous Tissue) exposed. There is [name] small amount of serous drainage noted. The wound margin is distinct with the outline attached to the wound base. There is small (1-33%) pink, pale granulation within the wound bed. There is [name] large (67-100%) amount of necrotic tissue within the wound bed including  Adherent Slough. Wound #12 status is Open. Original cause  of wound was Pressure Injury. The date acquired was: 03/16/2020. The wound has been in treatment 58 weeks. The wound is located on the Left,Medial,Plantar Foot. The wound measures 0.5cm length x 0.4cm width x 0.2cm depth; 0.157cm^2 area and 0.031cm^3 volume. There is Fat Layer (Subcutaneous Tissue) exposed. There is [name] medium amount of serosanguineous drainage noted. The wound margin is flat and intact. There is large (67-100%) pink, pale granulation within the wound bed. There is [name] small (1-33%) amount of necrotic tissue within the wound bed including Adherent Slough. 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 I debrided nonviable tissue. We continue Hydrofera Blue under the total contact cast on the left lower extremity. I recommended continue Aquacel Ag to the right plantar foot wound. Continue aggressive offloading here. Follow-up in 1 week. Procedures Wound #11 Pre-procedure diagnosis of Wound #11 is an Open Surgical Wound located on the Right,Plantar Foot . There was [name] Selective/Open Wound Skin/Epidermis Debridement with [name] total area of 0.07 sq cm performed by Geralyn Corwin, MD. With the following instrument(s): Curette to remove Viable and Non-Viable tissue/material. Material removed includes Callus, Slough, Skin: Dermis, and Skin: Epidermis. [name] time out was conducted at 11:32, prior to the start of the procedure. [name] Minimum amount of bleeding was controlled with Pressure. The procedure was tolerated well. Post Debridement  Measurements: 0.3cm length x 0.3cm width x 0.4cm depth; 0.028cm^3 volume. Character of Wound/Ulcer Post Debridement is stable. Post procedure Diagnosis Wound #11: Same as Pre-Procedure Wound #12 Pre-procedure diagnosis of Wound #12 is [name] Diabetic Wound/Ulcer of the Lower Extremity located on the Left,Medial,Plantar Foot .Severity of Tissue Pre Debridement is: Fat layer exposed. There was [name] Selective/Open Wound Non-Viable Tissue Debridement with [name] total area of 4.08 sq cm performed by Geralyn Corwin, MD. With the following instrument(s): Curette to remove Viable and Non-Viable tissue/material. Material removed includes Callus. [name] time out was conducted at 11:31, prior to the start of the procedure. [name] Minimum amount of bleeding was controlled with Pressure. The procedure was tolerated well. Post Debridement Measurements: 0.5cm length x 10.4cm width x 0.2cm depth; 0.817cm^3 volume. Character of Wound/Ulcer Post Debridement is stable. Severity of Tissue Post Debridement is: Fat layer exposed. Post procedure Diagnosis Wound #12: Same as Pre-Procedure Pre-procedure diagnosis of Wound #12 is [name] Diabetic Wound/Ulcer of the Lower Extremity located on the Left,Medial,Plantar Foot . There was [name] T Contact otal Cast Procedure by Geralyn Corwin, MD. Post procedure Diagnosis Wound #12: Same as Pre-Procedure Plan Follow-up Appointments: Return Appointment in 1 week. Nurse Visit as needed Bathing/ Shower/ Hygiene: May shower with wound dressing protected with water repellent cover or cast protector. No tub bath. Anesthetic (Use 'Patient Medications' Section for Anesthetic Order Entry): Lidocaine applied to wound bed Edema Control - Lymphedema / Segmental Compressive Device / OtherLANAYSIA, FRITCHMAN [name] ([id number]) [id number] Page 20 of 23 Elevate, Exercise Daily and Avoid Standing for Long Periods of Time. Elevate legs to the level of the heart and pump ankles as often as  possible Elevate leg(s) parallel to the floor when sitting. Off-Loading: T Contact Cast to Left Lower Extremity - tcc applied left leg otal Open toe surgical  shoe - Heel off loader - Right foot- Other: - keep pressure off of feet. Additional Orders / Instructions: Follow Nutritious Diet and Increase Protein Intake Other: - Stay off of foot, wheelchair only WOUND #11: - Foot Wound Laterality: Plantar, Right Cleanser: Wound Cleanser 3 x Per Week/30 Days Discharge Instructions: Wash your hands with soap and water. Remove old dressing, discard into plastic bag and place into trash. Cleanse the wound with Wound Cleanser prior to applying [name] clean dressing using gauze sponges, not tissues or cotton balls. Do not scrub or use excessive force. Pat dry using gauze sponges, not tissue or cotton balls. Prim Dressing: Aquacel Extra Hydrofiber Dressing, 2x2 (in/in) 3 x Per Week/30 Days ary Secondary Dressing: ABD Pad 5x9 (in/in) (Generic) 3 x Per Week/30 Days Discharge Instructions: Cover with ABD pad Secured With: Medipore T - 65M Medipore H Soft Cloth Surgical T ape ape, 2x2 (in/yd) 3 x Per Week/30 Days Secured With: [employer] or Non-Sterile 6-ply 4.5x4 (yd/yd) 3 x Per Week/30 Days Discharge Instructions: Apply Kerlix as directed WOUND #12: - Foot Wound Laterality: Plantar, Left, Medial Cleanser: Wound Cleanser 1 x Per Week/30 Days Discharge Instructions: Wash your hands with soap and water. Remove old dressing, discard into plastic bag and place into trash. Cleanse the wound with Wound Cleanser prior to applying [name] clean dressing using gauze sponges, not tissues or cotton balls. Do not scrub or use excessive force. Pat dry using gauze sponges, not tissue or cotton balls. Prim Dressing: Hydrofera Blue Ready Transfer Foam, 2.5x2.5 (in/in) 1 x Per Week/30 Days ary Discharge Instructions: Apply Hydrofera Blue Ready to wound bed as directed Secondary Dressing: heel cup 1 x Per Week/30  Days Discharge Instructions: x2 for heel and front of foot Secured With: Medipore T - 65M Medipore H Soft Cloth Surgical T ape ape, 2x2 (in/yd) 1 x Per Week/30 Days Secured With: [employer] Sterile or Non-Sterile 6-ply 4.5x4 (yd/yd) 1 x Per Week/30 Days Discharge Instructions: Apply Kerlix as directed 1. In office sharp debridement 2. Hydrofera Blue 3. T contact cast placed in standard fashion otal 4. Aquacel Ag 5. Aggressive offloadingoffloading heel shoe for the right 6. Follow-up in 1 week Electronic Signature(s) Signed: 04/26/2023 4:13:50 PM By: Geralyn Corwin DO Entered By: Geralyn Corwin on 04/26/2023 12:21:33 -------------------------------------------------------------------------------- ROS/PFSH Details Patient Name: Date of Service: [name], Pauline [name]. 04/26/2023 11:00 [name] Medical Record Number: [id number] Patient Account Number: [id number] Date of Birth/Sex: Treating RN: 1976/07/27 (47 y.o. Skip Mayer Primary Care Provider: [name] Other Clinician: [name] Referring Provider: Treating Provider/Extender: Chapman Moss in Treatment: 29 Information Obtained From Patient Ear/Nose/Mouth/Throat Medical History: Positive for: Chronic sinus problems/congestion; Middle ear problems Hematologic/Lymphatic Medical History: Positive for: Anemia CONSUELLA, SCURLOCK [name] ([id number]) [phone].pdf Page 21 of 23 Respiratory Medical History: Positive for: Chronic Obstructive Pulmonary Disease (COPD) Cardiovascular Medical History: Positive for: Congestive Heart Failure - EF 33% Endocrine Medical History: Positive for: Type II Diabetes Time with diabetes: 14 years Treated with: Insulin, Oral agents Blood sugar tested every day: No Blood sugar testing results: Bedtime: 176 Genitourinary Medical History: Positive for: End Stage Renal Disease Integumentary (Skin) Medical History: Positive for: History of pressure  wounds Neurologic Medical History: Positive for: Neuropathy - feel and legs HBO Extended History Items Ear/Nose/Mouth/Throat: Ear/Nose/Mouth/Throat: Chronic sinus Middle ear problems problems/congestion Immunizations Pneumococcal Vaccine: Received Pneumococcal Vaccination: No Implantable Devices No devices added Family and Social History Current every day smoker; Marital Status - Single; Alcohol Use: Never; Drug Use:  Prior History; Caffeine Use: Daily Electronic Signature(s) Signed: 04/26/2023 4:13:50 PM By: Geralyn Corwin DO Signed: 05/16/2023 8:06:11 AM By: [name], BSN, RN, CWS, [name] RN, [name] Entered By: Geralyn Corwin on 04/26/2023 12:22:39 -------------------------------------------------------------------------------- Total Contact Cast Details Patient Name: Date of Service: [name] 04/26/2023 11:00 [name] Medical Record Number: [id number] Patient Account Number: [id number] JEANNE, DIEFENDORF [name] ([id number]) [phone].pdf Page 22 of 23 Date of Birth/Sex: Treating RN: 06/24/1976 (47 y.o. Skip Mayer Primary Care Provider: Other Clinician: Debby Freiberg Referring Provider: Treating Provider/Extender: Chapman Moss in Treatment: 17 T Contact Cast Applied for Wound Assessment: otal Wound #12 Left,Medial,Plantar Foot Performed By: Physician Geralyn Corwin, MD Post Procedure Diagnosis Same as Pre-procedure Electronic Signature(s) Signed: 04/26/2023 4:13:50 PM By: Geralyn Corwin DO Signed: 04/26/2023 4:57:17 PM By: [name] Entered By: [name] on 04/26/2023 11:32:12 -------------------------------------------------------------------------------- SuperBill Details Patient Name: Date of Service: [name] 04/26/2023 Medical Record Number: [id number] Patient Account Number: [id number] Date of Birth/Sex: Treating RN: 14-May-1976 (47 y.o. 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.621 Type 2 diabetes mellitus with foot ulcer E11.42 Type 2 diabetes mellitus with diabetic polyneuropathy L97.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 of skin Z79.4 Long term (current) use of insulin Facility Procedures : CPT4 Code: [id number] Description: 97597 - DEBRIDE WOUND 1ST 20 SQ CM OR < ICD-10 Diagnosis Description 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.621 Type 2 diabetes mellitus with foot ulcer Modifier: Quantity: 1 : CPT4 Code: 10626948 Description: 54627 - APPLY TOTAL CONTACT LEG CAST ICD-10 Diagnosis Description 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i E11.621 Type 2 diabetes mellitus with foot ulcer Modifier: ty Quantity: 1 Physician Procedures : CPT4 Code Description Modifier [id number] 97597 - WC PHYS DEBR WO ANESTH 20 SQ CM ICD-10 Diagnosis Description 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.621 Type 2 diabetes mellitus with foot ulcer Quantity: 1 : [id number] - WC PHYS APPLY TOTAL CONTACT CAST SHADASIA, [place] [name] ([id number]) [id number] ICD-10 Diagnosis Description 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ity E11.621 Type 2  diabetes mellitus with foot ulcer Quantity: 1 17.pdf Page 23 of 23 Electronic Signature(s) Signed: 04/26/2023 4:13:50 PM By: Geralyn Corwin DO Entered By: Geralyn Corwin on 04/26/2023 12:22:14

## 2023-05-16 NOTE — Progress Notes (Signed)
[name] ([id number]) [id number] Page 1 of 9 Visit Report for 04/26/2023 Arrival Information Details Patient Name: Date of Service: [name], [name] 04/26/2023 11:00 [name]: [id number] Patient Account Number: [id number] Date of Birth/Sex: Treating RN: February 03, 1976 (47 y.o. Skip Mayer Primary Care Gennavieve Huq: [name] Other Clinician: [name] Referring Zaylin Runco: Treating Rick Carruthers/Extender: Chapman Moss in Treatment: 37 Visit Information History Since Last Visit All ordered tests and consults were completed: No Patient Arrived: Wheel Chair Added or deleted any medications: No Arrival Time: 11:06 Any new allergies or adverse reactions: No Transfer Assistance: None Had [name] fall or experienced change in No Patient Identification Verified: Yes activities of daily living that may affect Secondary Verification Process Completed: Yes risk of falls: Patient Requires Transmission-Based Precautions: No Signs or symptoms of abuse/neglect since last visito No Patient Has Alerts: No Hospitalized since last visit: No Implantable device outside of the clinic excluding No cellular tissue based products placed in the center since last visit: Has Dressing in Place as Prescribed: Yes Has Footwear/Offloading in Place as Prescribed: Yes Left: T Contact Cast otal Right: Wedge Shoe Pain Present Now: No Electronic Signature(s) Signed: 04/26/2023 4:57:17 PM By: [name] Entered By: [name] on 04/26/2023 11:07:15 -------------------------------------------------------------------------------- Clinic Level of Care Assessment Details Patient Name: Date of Service: [name], [name] 04/26/2023 11:00 [name]: [id number] Patient Account Number: [id number] Date of Birth/Sex: Treating RN: [birth year] (47 y.o. Skip Mayer Primary Care Zoha Spranger: [name] Other Clinician: [name] Referring Arrayah Connors: Treating Grae Cannata/Extender: Chapman Moss in Treatment: 32 Clinic Level of Care Assessment Items TOOL 1 Quantity Score []  - 0 Use when EandM and Procedure is performed on INITIAL visit ASSESSMENTS - Nursing Assessment / Reassessment [name] ([id number]) [id number] Page 2 of 9 []  - 0 General Physical Exam (combine w/ comprehensive assessment (listed just below) when performed on new pt. evals) []  - 0 Comprehensive Assessment (HX, ROS, Risk Assessments, Wounds Hx, etc.) ASSESSMENTS - Wound and Skin Assessment / Reassessment []  - 0 Dermatologic / Skin Assessment (not related to wound area) ASSESSMENTS - Ostomy and/or Continence Assessment and Care []  - 0 Incontinence Assessment and Management []  - 0 Ostomy Care Assessment and Management (repouching, etc.) PROCESS - Coordination of Care []  - 0 Simple Patient / Family Education for ongoing care []  - 0 Complex (extensive) Patient / Family Education for ongoing care []  - 0 Staff obtains Chiropractor, Records, T Results / Process Orders est []  - 0 Staff telephones HHA, Nursing Homes / Clarify orders / etc []  - 0 Routine Transfer to another Facility (non-emergent condition) []  - 0 Routine Hospital Admission (non-emergent condition) []  - 0 New Admissions / Manufacturing engineer / Ordering NPWT Apligraf, etc. , []  - 0 Emergency Hospital Admission (emergent condition) PROCESS - Special Needs []  - 0 Pediatric / Minor Patient Management []  - 0 Isolation Patient Management []  - 0 Hearing / Language / Visual special needs []  - 0 Assessment of Community assistance (transportation, D/C planning, etc.) []  - 0 Additional assistance / Altered mentation []  - 0 Support Surface(s) Assessment (bed, cushion, seat, etc.) INTERVENTIONS - Miscellaneous []  - 0 External ear exam []  - 0 Patient Transfer (multiple staff / Nurse, adult / Similar devices) []  -  0 Simple Staple / Suture removal (25 or less) []  - 0 Complex Staple / Suture removal (26 or more) []  - 0 Hypo/Hyperglycemic Management (do not check if billed separately) []  -  0 Ankle / Brachial Index (ABI) - do not check if billed separately Has the patient been seen at the hospital within the last three years: Yes Total Score: 0 Level Of Care: ____ Electronic Signature(s) Signed: 04/26/2023 4:57:17 PM By: [name] Entered By: [name] on 04/26/2023 11:33:14 -------------------------------------------------------------------------------- Encounter Discharge Information Details Patient Name: Date of Service: [name], Heather Bandy [name]. 04/26/2023 11:00 [name]: [id number] Patient Account Number: [id number] [name] ([id number]) [phone].pdf Page 3 of 9 Date of Birth/Sex: Treating RN: 01-13-1976 (47 y.o. Skip Mayer Primary Care Veda Arrellano: Other Clinician: Debby Freiberg Referring Jury Caserta: Treating Keelynn Furgerson/Extender: Chapman Moss in Treatment: 84 Encounter Discharge Information Items Post Procedure Vitals Discharge Condition: Stable Temperature (F): 98.5 Ambulatory Status: Wheelchair Pulse (bpm): 66 Discharge Destination: Home Respiratory Rate (breaths/min): 18 Transportation: Private Auto Blood Pressure (mmHg): 129/82 Accompanied By: mother Schedule Follow-up Appointment: Yes Clinical Summary of Care: Electronic Signature(s) Signed: 04/26/2023 4:57:17 PM By: [name] Entered By: [name] on 04/26/2023 11:35:12 -------------------------------------------------------------------------------- Lower Extremity Assessment Details Patient Name: Date of Service: [name], [name] 04/26/2023 11:00 [name]: [id number] Patient Account Number: [id number] Date of Birth/Sex: Treating RN: 01-25-1976 (47 y.o. Skip Mayer Primary Care Satcha Storlie: [name] Other  Clinician: [name] Referring Iyla Balzarini: Treating Vetta Couzens/Extender: [name] Weeks in Treatment: 58 Edema Assessment Assessed: [Left: No] [Right: No] [Left: Edema] [Right: :] Calf Left: Right: Point of Measurement: 31 cm From Medial Instep Ankle Left: Right: Point of Measurement: 10 cm From Medial Instep Electronic Signature(s) Signed: 04/26/2023 4:57:17 PM By: [name] Signed: 05/16/2023 8:06:11 AM By: Elliot Gurney, BSN, RN, CWS, [name] RN, BSN Entered By: [name] on 04/26/2023 11:23:45 [name] ([id number]) [id number] Page 4 of 9 -------------------------------------------------------------------------------- Multi Wound Chart Details Patient Name: Date of Service: [name], [name] 04/26/2023 11:00 [name]: [id number] Patient Account Number: [id number] Date of Birth/Sex: Treating RN: [birth year] (47 y.o. 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: Extremity Chronic sinus problems/congestion, Chronic sinus problems/congestion, N/[name] Comorbid History: Middle ear problems, Anemia, Chronic Middle ear problems, Anemia, Chronic Obstructive Pulmonary Disease Obstructive Pulmonary Disease (COPD), Congestive Heart Failure, (COPD), Congestive Heart Failure, Type II Diabetes, End Stage Renal Type II Diabetes, End Stage Renal Disease, History of pressure wounds, Disease,  History of pressure wounds, Neuropathy Neuropathy 12/01/2021 03/16/2020 N/[name] Date Acquired: 71 58 N/[name] Weeks of Treatment: Open Open N/[name] Wound Status: No No N/[name] Wound Recurrence: 0.3x0.3x0.4 0.5x0.4x0.2 N/[name] Measurements L x W x D (cm) 0.071 0.157 N/[name] [name] (cm) : rea 0.028 0.031 N/[name] Volume (cm) : 96.20% 96.70% N/[name] % Reduction in Area: 98.30% 96.70% N/[name] % Reduction in Volume: Full Thickness Without Exposed Grade 3 N/[name] Classification: Support Structures Small Medium N/[name] Exudate Amount: Serous Serosanguineous N/[name] Exudate Type: amber red, brown N/[name] Exudate Color: Distinct, outline attached Flat and Intact N/[name] Wound Margin: Small (1-33%) Large (67-100%) N/[name] Granulation Amount: Pink, Pale Pink, Pale N/[name] Granulation Quality: Large (67-100%) Small (1-33%) N/[name] Necrotic Amount: Fat Layer (Subcutaneous Tissue): Yes Fat Layer (Subcutaneous Tissue): Yes N/[name] Exposed Structures:  Fascia: No Fascia: No Tendon: No Tendon: No Muscle: No Muscle: No Joint: No Joint: No Bone: No Bone: No Medium (34-66%) None N/[name] Epithelialization: Treatment Notes Electronic Signature(s) Signed: 04/26/2023 4:57:17 PM By: [name] Entered By: [name] on 04/26/2023 11:23:52 [name] ([id number]) [id number] Page 5 of 9 -------------------------------------------------------------------------------- Multi-Disciplinary Care Plan Details Patient Name: Date of Service: [name], [name] 04/26/2023 11:00 [name]: [id number] Patient Account Number: [id number] Date of Birth/Sex: Treating RN: 09/12/1976 (47 y.o. Cathlean Cower, [name] Primary Care Rosabella Edgin: [name] Other Clinician: [name] Referring Neiman Roots: Treating Casimir Barcellos/Extender: Chapman Moss in Treatment: 84 Active Inactive Abuse / Safety / Falls / Self Care Management Nursing Diagnoses: History of Falls Potential for falls Potential for injury related to  falls Goals: Patient will not develop complications from immobility Date Initiated: 03/17/2022 Date Inactivated: 04/20/2022 Target Resolution Date: 03/16/2022 Goal Status: Met Patient/caregiver will verbalize understanding of skin care regimen Date Initiated: 03/17/2022 Date Inactivated: 09/28/2022 Target Resolution Date: 03/16/2022 Goal Status: Met Patient/caregiver will verbalize/demonstrate measure taken to improve self care Date Initiated: 03/17/2022 Target Resolution Date: 04/20/2022 Goal Status: Active Interventions: Assess fall risk on admission and as needed Provide education on basic hygiene Provide education on personal and home safety Notes: Osteomyelitis Nursing Diagnoses: Infection: osteomyelitis Knowledge deficit related to disease process and management Goals: Patient/caregiver will verbalize understanding of disease process and disease management Date Initiated: 03/17/2022 Date Inactivated: 03/27/2023 Target Resolution Date: 03/16/2022 Goal Status: Met Patient's osteomyelitis will resolve Date Initiated: 03/17/2022 Target Resolution Date: 10/26/2022 Goal Status: Active Signs and symptoms for osteomyelitis will be recognized and promptly addressed Date Initiated: 03/17/2022 Target Resolution Date: 10/26/2022 Goal Status: Active Interventions: Assess for signs and symptoms of osteomyelitis resolution every visit Provide education on osteomyelitis Treatment Activities: Systemic antibiotics : 03/16/2022 Notes: Patient on antibiotics. SHAMBRIA, CAMERER [name] ([id number]) [id number] Page 6 of 9 Electronic Signature(s) Signed: 04/26/2023 4:57:17 PM By: [name] Signed: 05/16/2023 8:06:11 AM By: Elliot Gurney, BSN, RN, CWS, [name] RN, BSN Entered By: [name] on 04/26/2023 11:33:35 -------------------------------------------------------------------------------- Pain Assessment Details Patient Name: Date of Service: Heather Jefferson [name]. 04/26/2023 11:00 [name]  M Medical Record Number: [id number] Patient Account Number: [id number] Date of Birth/Sex: Treating RN: 1976-01-26 (47 y.o. Skip Mayer Primary Care Eshani Maestre: [name] Other Clinician: [name] Referring Lashaun Krapf: Treating Joliana Claflin/Extender: Chapman Moss in Treatment: 78 Active Problems Location of Pain Severity and Description of Pain Patient Has Paino No Site Locations Pain Management and Medication Current Pain Management: Electronic Signature(s) Signed: 04/26/2023 4:57:17 PM By: [name] Signed: 05/16/2023 8:06:11 AM By: Elliot Gurney, BSN, RN, CWS, [name] RN, BSN Entered By: [name] on 04/26/2023 11:09:52 [name] ([id number]) [id number] Page 7 of 9 -------------------------------------------------------------------------------- Patient/Caregiver Education Details Patient Name: Date of Service: [name], [name] 7/3/2024andnbsp11:00 [name]: [id number] Patient Account Number: [id number] Date of Birth/Gender: Treating RN: 12-08-1975 (47 y.o. Skip Mayer Primary Care Physician: [name] Other Clinician: [name] Referring Physician: Treating Physician/Extender: Chapman Moss in Treatment: 59 Education Assessment Education Provided To: Patient Education Topics Provided Wound/Skin Impairment: Handouts: Other: continue wound care as directed Methods: Explain/Verbal Responses: State content correctly Electronic Signature(s) Signed: 04/26/2023 4:57:17 PM By: [name] Entered By: [name] on 04/26/2023 11:33:53 -------------------------------------------------------------------------------- Wound Assessment Details Patient Name: Date of Service: [name], [name] 04/26/2023 11:00 [name]: [id number] Patient Account Number: [id number] Date of Birth/Sex: Treating RN: 06-01-1976 (47 y.o.  Cathlean Cower, [name] Primary Care Whitley Strycharz:  [name] Other Clinician: [name] Referring Lessie Manigo: Treating Dhyan Noah/Extender: Chapman Moss in Treatment: 58 Wound Status Wound Number: 11 Primary Open Surgical Wound Etiology: Wound Location: Right, Plantar Foot Wound Open Wounding Event: Surgical Injury Status: Date Acquired: 12/01/2021 Comorbid Chronic sinus problems/congestion, Middle ear problems, Anemia, Weeks Of Treatment: 58 History: Chronic Obstructive Pulmonary Disease (COPD), Congestive Heart Clustered Wound: No Failure, Type II Diabetes, End Stage Renal Disease, History of pressure wounds, Neuropathy Photos Wound Measurements Braaten, Aianna [name] ([id number]) Length: (cm) 0.3 Width: (cm) 0.3 Depth: (cm) 0.4 Area: (cm) 0.071 Volume: (cm) 0.028 [id number] Page 8 of 9 % Reduction in Area: 96.2% % Reduction in Volume: 98.3% Epithelialization: Medium (34-66%) Wound Description Classification: Full Thickness Without Exposed Suppo Wound Margin: Distinct, outline attached Exudate Amount: Small Exudate Type: Serous Exudate Color: amber rt Structures Foul Odor After Cleansing: No Slough/Fibrino Yes Wound Bed Granulation Amount: Small (1-33%) Exposed Structure Granulation Quality: Pink, Pale Fascia Exposed: No Necrotic Amount: Large (67-100%) Fat Layer (Subcutaneous Tissue) Exposed: Yes Necrotic Quality: Adherent Slough Tendon Exposed: No Muscle Exposed: No Joint Exposed: No Bone Exposed: No Electronic Signature(s) Signed: 04/26/2023 4:57:17 PM By: [name] Signed: 05/16/2023 8:06:11 AM By: Elliot Gurney, BSN, RN, CWS, [name] RN, BSN Entered By: [name] on 04/26/2023 11:23:26 -------------------------------------------------------------------------------- Wound Assessment Details Patient Name: Date of Service: Heather Jefferson [name]. 04/26/2023 11:00 [name]: [id number] Patient Account Number: [id number] Date of Birth/Sex: Treating  RN: 20-Oct-1976 (47 y.o. Cathlean Cower, [name] Primary Care Miyonna Ormiston: [name] Other Clinician: [name] Referring Dakota Vanwart: Treating Habeeb Puertas/Extender: Chapman Moss in Treatment: 58 Wound Status Wound Number: 12 Primary Diabetic Wound/Ulcer of the Lower Extremity Etiology: Wound Location: Left, Medial, Plantar Foot Wound Open Wounding Event: Pressure Injury Status: Date Acquired: 03/16/2020 Comorbid Chronic sinus problems/congestion, Middle ear problems, Anemia, Weeks Of Treatment: 58 History: Chronic Obstructive Pulmonary Disease (COPD), Congestive Heart Clustered Wound: No Failure, Type II Diabetes, End Stage Renal Disease, History of pressure wounds, Neuropathy Photos Wound Measurements Buttrey, [name] ([id number]) Length: (cm) 0.5 Width: (cm) 0.4 Depth: (cm) 0.2 Area: (cm) 0. Volume: (cm) 0. [id number] Page 9 of 9 % Reduction in Area: 96.7% % Reduction in Volume: 96.7% Epithelialization: None 157 031 Wound Description Classification: Grade 3 Wound Margin: Flat and Intact Exudate Amount: Medium Exudate Type: Serosanguineous Exudate Color: red, brown Foul Odor After Cleansing: No Slough/Fibrino No Wound Bed Granulation Amount: Large (67-100%) Exposed Structure Granulation Quality: Pink, Pale Fascia Exposed: No Necrotic Amount: Small (1-33%) Fat Layer (Subcutaneous Tissue) Exposed: Yes Necrotic Quality: Adherent Slough Tendon Exposed: No Muscle Exposed: No Joint Exposed: No Bone Exposed: No Electronic Signature(s) Signed: 04/26/2023 4:57:17 PM By: [name] Signed: 05/16/2023 8:06:11 AM By: Elliot Gurney, BSN, RN, CWS, [name] RN, BSN Entered By: [name] on 04/26/2023 11:23:03 -------------------------------------------------------------------------------- Vitals Details Patient Name: Date of Service: [name], [name] 04/26/2023 11:00 [name]: [id number] Patient Account Number:  [id number] Date of Birth/Sex: Treating RN: 1976-07-06 (47 y.o. Cathlean Cower, [name] Primary Care Gerritt Galentine: [name] Other Clinician: [name] Referring Dalya Maselli: Treating Rhiana Morash/Extender: Chapman Moss in Treatment: 48 Vital Signs Time Taken: 11:07 Temperature (F): 98.5 Height (in): 69 Pulse (bpm): 66 Weight (lbs): 178 Respiratory Rate (breaths/min): 18 Body Mass Index (BMI): 26.3 Blood Pressure (mmHg): 129/82 Reference Range: 80 - 120 mg / dl Electronic Signature(s) Signed: 04/26/2023 4:57:17 PM By: [name] Entered By: [name] on 04/26/2023 11:09:27

## 2023-05-17 ENCOUNTER — Encounter: Payer: MEDICAID | Admitting: Internal Medicine

## 2023-05-17 DIAGNOSIS — L97528 Non-pressure chronic ulcer of other part of left foot with other specified severity: Secondary | ICD-10-CM | POA: Diagnosis not present

## 2023-05-17 DIAGNOSIS — E11622 Type 2 diabetes mellitus with other skin ulcer: Secondary | ICD-10-CM

## 2023-05-17 DIAGNOSIS — W540XXA Bitten by dog, initial encounter: Secondary | ICD-10-CM

## 2023-05-17 DIAGNOSIS — E11621 Type 2 diabetes mellitus with foot ulcer: Secondary | ICD-10-CM

## 2023-05-17 DIAGNOSIS — S81801A Unspecified open wound, right lower leg, initial encounter: Secondary | ICD-10-CM | POA: Diagnosis not present

## 2023-05-17 DIAGNOSIS — L97512 Non-pressure chronic ulcer of other part of right foot with fat layer exposed: Secondary | ICD-10-CM

## 2023-05-17 NOTE — Progress Notes (Signed)
[name] ([id number]) [id number] Page 1 of 25 Visit Report for 05/17/2023 Chief Complaint Document Details Patient Name: Date of Service: [name], FUERTE 05/17/2023 11:00 [name]: [id number] Patient Account Number: [id number] Date of Birth/Sex: Treating RN: 1976/03/11 (47 y.o. [name], [name] Primary Care Provider: [name] Other Clinician: Referring Provider: Treating Provider/Extender: Chapman Moss in Treatment: 102 Information Obtained from: Patient Chief Complaint 03/19/2021; patient referred by Dr. Excell Seltzer who has been looking after her left foot for quite [name] period of time for review of [name] nonhealing area in the left midfoot 03/12/2022; bilateral feet wounds and right lower extremity wound. Electronic Signature(s) Signed: 05/17/2023 2:12:08 PM By: Geralyn Corwin DO Entered By: Geralyn Corwin on 05/17/2023 12:50:23 -------------------------------------------------------------------------------- Debridement Details Patient Name: Date of Service: Heather Jefferson [name]. 05/17/2023 11:00 [name]: [id number] Patient Account Number: [id number] Date of Birth/Sex: Treating RN: 1976-08-30 (47 y.o. [name], [name] Primary Care Provider: [name] Other Clinician: Referring Provider: Treating Provider/Extender: Chapman Moss in Treatment: 61 Debridement Performed for Assessment: Wound #12 Left,Medial,Plantar Foot Performed By: Physician Geralyn Corwin, MD Debridement Type: Debridement Severity of Tissue Pre Debridement: Fat layer exposed Level of Consciousness (Pre-procedure): Awake and Alert Pre-procedure Verification/Time Out Yes - 11:38 Taken: Start Time: 11:38 Pain Control: Lidocaine 2% Topical Gel Percent of Wound Bed Debrided: 100% T Area Debrided (cm): otal 0.07 Tissue and other material debrided: Non-Viable, Callus, Slough, Skin: Dermis ,  Slough Level: Skin/Dermis Debridement Description: Selective/Open Wound Instrument: Curette Bleeding: None End Time: 11:39 Procedural Pain: 0 AVAIYAH, STRUBEL [name] ([id number]) [id number] Page 2 of 25 Post Procedural Pain: 0 Response to Treatment: Procedure was tolerated well Level of Consciousness (Post- Awake and Alert procedure): Post Debridement Measurements of Total Wound Length: (cm) 0.3 Width: (cm) 0.3 Depth: (cm) 0.4 Volume: (cm) 0.028 Character of Wound/Ulcer Post Debridement: Improved Severity of Tissue Post Debridement: Fat layer exposed Post Procedure Diagnosis Same as Pre-procedure Electronic Signature(s) Signed: 05/17/2023 2:12:08 PM By: Geralyn Corwin DO Signed: 05/17/2023 4:46:32 PM By: Bonnell Public Entered By: Bonnell Public on 05/17/2023 11:39:36 -------------------------------------------------------------------------------- Debridement Details Patient Name: Date of Service: Heather Jefferson [name]. 05/17/2023 11:00 [name]: [id number] Patient Account Number: [id number] Date of Birth/Sex: Treating RN: Aug 18, 1976 (47 y.o. [name], [name] Primary Care Provider: [name] Other Clinician: Referring Provider: Treating Provider/Extender: Chapman Moss in Treatment: 61 Debridement Performed for Assessment: Wound #11 Right,Plantar Foot Performed By: Physician Geralyn Corwin, MD Debridement Type: Debridement Level of Consciousness (Pre-procedure): Awake and Alert Pre-procedure Verification/Time Out Yes - 11:38 Taken: Start Time: 11:41 Pain Control: Lidocaine 2% Topical Gel Percent of Wound Bed Debrided: 100% T Area Debrided (cm): otal 0.38 Tissue and other material debrided: Non-Viable, Callus, Slough, Subcutaneous, Skin: Dermis , Slough Level: Skin/Subcutaneous Tissue Debridement Description: Excisional Instrument: Curette Bleeding: None End Time: 11:42 Procedural Pain: 0 Post  Procedural Pain: 0 Response to Treatment: Procedure was tolerated well Level of Consciousness (Post- Awake and Alert procedure): Post Debridement Measurements of Total Wound Length: (cm) 0.7 Width: (cm) 0.7 Depth: (cm) 1 Volume: (cm) 0.385 Character of Wound/Ulcer Post Debridement: Improved Post Procedure Diagnosis Same as Pre-procedure ADABELLE, GRIFFITHS [name] ([id number]) [id number] Page 3 of 25 Electronic Signature(s) Signed: 05/17/2023 2:12:08 PM By: Geralyn Corwin DO Signed: 05/17/2023 4:46:32 PM By: Bonnell Public Entered By: Bonnell Public on 05/17/2023 11:44:50 -------------------------------------------------------------------------------- Debridement Details Patient Name: Date of Service: Heather Carry, Shakeena [name]. 05/17/2023 11:00 [name]:  [id number] Patient Account Number: [id number] Date of Birth/Sex: Treating RN: [birth year] (47 y.o. [name], [name] Primary Care Provider: [name] Other Clinician: Referring Provider: Treating Provider/Extender: Chapman Moss in Treatment: 61 Debridement Performed for Assessment: Wound #14 Right,Anterior Lower Leg Performed By: Physician Geralyn Corwin, MD Debridement Type: Debridement Level of Consciousness (Pre-procedure): Awake and Alert Pre-procedure Verification/Time Out Yes - 11:38 Taken: Start Time: 11:45 Pain Control: Lidocaine 2% Topical Gel Percent of Wound Bed Debrided: 100% T Area Debrided (cm): otal 1.49 Tissue and other material debrided: Non-Viable, Eschar, Slough, Slough Level: Non-Viable Tissue Debridement Description: Selective/Open Wound Instrument: Blade, Forceps Bleeding: None End Time: 11:46 Procedural Pain: 0 Post Procedural Pain: 0 Response to Treatment: Procedure was tolerated well Level of Consciousness (Post- Awake and Alert procedure): Post Debridement Measurements of Total Wound Length: (cm) 1.9 Width: (cm) 1 Depth: (cm)  0.2 Volume: (cm) 0.298 Character of Wound/Ulcer Post Debridement: Improved Post Procedure Diagnosis Same as Pre-procedure Electronic Signature(s) Signed: 05/17/2023 2:12:08 PM By: Geralyn Corwin DO Signed: 05/17/2023 4:46:32 PM By: Bonnell Public Entered By: Bonnell Public on 05/17/2023 11:46:46 [name] ([id number]) [id number] Page 4 of 25 -------------------------------------------------------------------------------- HPI Details Patient Name: Date of Service: ALIA, PARSLEY 05/17/2023 11:00 [name]: [id number] Patient Account Number: [id number] Date of Birth/Sex: Treating RN: 1976-07-02 (47 y.o. [name], [name] Primary Care Provider: [name] Other Clinician: Referring Provider: Treating Provider/Extender: Chapman Moss in Treatment: 95 History of Present Illness HPI Description: 01/18/18-She is here for initial evaluation of the left great toe ulcer. She is [name] poor historian in regards to timeframe in detail. She states approximately 4 weeks ago she lacerated her toe on something in the house. She followed up with her primary care who placed her on Bactrim and ultimately [name] second dose of Bactrim prior to coming to wound clinic. She states she has been treating the toe with peroxide, Betadine and [name] Band-Aid. She did not check her blood sugar this morning but checked it yesterday morning it was 327; she is unaware of [name] recent A1c and there are no current records. She saw Dr. she would've orthopedics last week for an old injury to the left ankle, she states he did not see her toe, nor did she bring it to his attention. She smokes approximately 1 pack cigarettes [name] day. Her social situation is concerning, she arrives this morning with her mother who appears extremely intoxicated/under the influence; her mother was asked to leave the room and be monitored by the patient's grandmother. The patient's aunt then  accompanied the patient and the room throughout the rest of the appointment. We had [name] lengthy discussion regarding the deleterious effects of uncontrolled hyperglycemia and smoking as it relates to wound healing and overall health. She was strongly encouraged to decrease her smoking and get her diabetes under better control. She states she is currently on [name] diet and has cut down her [facility] consumption. The left toe is erythematous, macerated and slightly edematous with malodor present. The edema in her left foot is below her baseline, there is no erythema streaking. We will treat her with Santyl, doxycycline; we have ordered and xray, culture and provided [name] Peg assist surgical shoe and cultured the wound. 01/25/18-She is here in follow-up evaluation for [name] left great toe ulcer and presents with an abscess to her suprapubic area. She states her blood sugars remain elevated, feeling "sick" and if levels are below 250, but she is trying.  She has made no attempt to decrease her smoking stating that we "can't take away her food in her cigarettes". She has been compliant with offloading using the PEG assist you. She is using Santyl daily. the culture obtained last week grew staph aureus and Enterococcus faecalis; continues on the doxycycline and Augmentin was added on Monday. The suprapubic area has erythema, no femoral variation, purple discoloration, minimal induration, was accessed with [name] cotton tip applicator with sanguinopurulent drainage, this was cultured, I suspect the current antibiotic treatment will cover and we will not add anything to her current treatment plan. She was advised to go to urgent care or ER with any change in redness, induration or fever. 02/01/18-She is here in follow-up evaluation for left great toe ulcers and [name] new abdominal abscess from last week. She was able to use packing until earlier this week, where she "forgot it was there". She states she was feeling ill with GI  symptoms last week and was not taking her antibiotic. She states her glucose levels have been predominantly less than 200, with occasional levels between 200-250. She thinks this was contributing to her GI symptoms as they have resolved without intervention. There continues to be significant laceration to left toe, otherwise it clinically looks stable/improved. There is now less superficial opening to the lateral aspect of the great toe that was residual blister. We will transition to [facility] to all wounds, she will continue her Augmentin. If there is no change or deterioration next week for reculture. 02/08/18-She is here in follow-up evaluation for left great toe ulcer and abdominal ulcer. There is an improvement in both wounds. She has been wrapping her left toe with coban, not by our direction, which has created an area of discoloration to the medial aspect; she has been advised to NOT use coban secondary to her neuropathy. She states her glucose levels have been high over this last week ranging from 200-350, she continues to smoke. She admits to being less compliant with her offloading shoe. We will continue with same treatment plan and she will follow-up next week. 02/15/18-She is here in follow-up evaluation for left great toe ulcer and abdominal ulcer. The abdominal ulcer is epithelialized. The left great toe ulcer is improved and all injury from last week using the Coban wrap is resolved, the lateral ulcer is healed. She admits to noncompliance with wearing offloading shoe and admits to glucose levels being greater than 300 most of the week. She continues to smoke and expresses no desire to quit. There is one area medially that probes deeper than it has historically, erythema to the toe and dorsal foot has consistently waxed and waned. There is no overt signs of cellulitis or infection but we will culture the wound for any occult infection given the new area of depth and erythema. We will  hold off on sensitivities for initiation of antibiotic therapy. 02/22/18-She is here in follow up evaluation for left great toe ulcer. There is overall significant improvement in both wound appearance, erythema and edema with changes made last week. She was not initiated on antibiotic therapy. Culture obtained last week showed oxacillin sensitive staph aureus, sensitive to clindamycin. Clindamycin has been called into the pharmacy but she has been instructed to hold off on initiation secondary to overall clinical improvement and her history of antibiotic intolerance. She has been instructed to contact the clinic with any noted changes/deterioration and the wound, erythema, edema and/or pain. She will follow-up next week. She continues to smoke  and her glucose levels remain elevated >250; she admits to compliance with offloading shoe 03/01/18 on evaluation today patient appears to be doing fairly well in regard to her left first toe ulcer. She has been tolerating the dressing changes with the [facility] Dressing without complication and overall this has definitely showed signs of improvement according to records as well is what the patient tells me today. I'm very pleased in that regard. She is having no pain today 03/08/18 She is here for follow up evaluation of [name] left great toe ulcer. She remains non-compliant with glucose control and smoking cessation; glucose levels consistently >200. She states that she got new shoe inserts/peg assist. She admits to compliance with offloading. Since my last evaluation there is significant improvement. We will switch to prisma at this time and she will follow up next week. She is noted to be tachycardic at this appointment, heart rate 120s; she has [name] history of heart rate 70-130 according to our records. She admits to extreme agitation r/t personal issues; she was advised to monitor her heartrate and contact her physician if it does not return to [name] more normal range  (<100). She takes cardizem twice daily. 03/15/18-She is here in follow-up evaluation for left great toe ulcer. She remains noncompliant with glucose control and smoking cessation. She admits to compliance with wearing offloading shoe. The ulcer is improved/stable and we will continue with the same treatment plan and she will follow-up next week 03/22/18-She is here for evaluation for left great toe ulcer. There continues to be significant improvement despite recurrent hyperglycemia (over 500 yesterday) and she continues to smoke. She has been compliant with offloading and we will continue with same treatment plan and she will follow-up next week. 03/29/18-She is here for evaluation for left great toe ulcer. Despite continuing to smoke and uncontrolled diabetes she continues to improve. She is compliant with offloading shoe. We will continue with the same treatment plan and she will follow-up next week 04/05/18- She is here in follow up evaluation for [name] left great toe ulcer; she presents with small pustule to left fifth toe (resembles ant bite). She admits to compliance with wearing offloading shoe; continues to smoke or have uncontrolled blood glucose control. There is more callus than usual with evidence of bleeding; she denies known trauma. 04/12/18-She is here for evaluation of left great toe ulcer. Despite noncompliance with glycemic control and smoking she continues to make improvement. She continues to wear offloading shoe. The pustule, that was identified last week, to the left fifth toe is resolved. She will follow-up in 2 weeks 05/03/18-she is seen in follow-up evaluation for [name] left great toe ulcer. She is compliant with offloading, otherwise noncompliant with glycemic control and smoking. She has plateaued and there is minimal improvement noted. We will transition to [facility], replaced the insert to her surgical shoe and she will follow-up in one week Tarbell, Monteen [name] ([id number])  [id number] Page 5 of 25 05/10/18- She is here in follow up evaluation for [name] left great toe ulcer. It appears stable despite measurement change. We will continue with same treatment plan and follow up next week. 05/24/18-She is seen in follow-up evaluation for [name] left great toe ulcer. She remains compliant with offloading, has made significant improvement in her diet, decreasing the amount of sugar/soda. She said her recent A1c was 10.9 which is lower than. She did see [name] diabetic nutritionist/educator yesterday. She continues to smoke. We will continue with the same treatment plan and  she'll follow-up next week. 05/31/18- She is seen in follow-up evaluation for left great toe ulcer. She continues to remain compliant with offloading, continues to make improvement in her diet, increasing her water and decreasing the amount of sugar/soda. She does continue to smoke with no desire to quit. We will apply Prisma to the depth and Hydrofera Blue over. We have not received insurance authorization for oasis. She will follow up next week. 06/07/18-She is seen in follow-up evaluation for left great toe ulcer. It has stalled according to today's measurements although base appears stable. She says she saw [name] diabetic educator yesterday; her average blood sugars are less than 300 which is an improvement for her. She continues to smoke and states "that's my next step" She continues with water over soda. We will order for xray, culture and reinstate ace wrap compression prior to placing apligraf for next week. She is voicing no complaints or concerns. Her dressing will change to iodoflex over the next week in preparation for apligraf. 06/14/18-She is seen in follow-up evaluation for left great toe ulcer. Plain film x-ray performed last week was negative for osteomyelitis. Wound culture obtained last week grew strep B and OSSA; she is initiated on keflex and cefdinir today; there is erythema to the  toe which could be from ace wrap compression, she has [name] history of wrapping too tight and has has been encouraged to maintain ace wraps that we place today. We will hold off on application of apligraf today, will apply next week after antibiotic therapy has been initiated. She admits today that she has resumed taking [name] shower with her foot/toe submerged in water, she has been reminded to keep foot/toe out of the bath water. She will be seen in follow up next week 06/21/18-she is seen in follow-up evaluation for left great toe ulcer. She is tolerating antibiotic therapy with no GI disturbance. The wound is stable. Apligraf was applied today. She has been decreasing her smoking, only had 4 cigarettes yesterday and 1 today. She continues being more compliant in diabetic diet. She will follow-up next week for evaluation of site, if stable will remove at 2 weeks. 06/28/18- She is here in follow up evalution. Apligraf was placed last week, she states the dressing fell off on Tuesday and she was dressing with hydrofera blue. She is healed and will be discharged from the clinic today. She has been instructed to continue with smoking cessation, continue monitoring glucose levels, offloading for an additional 4 weeks and continue with hydrofera blue for additional two weeks for any possible microscopic opening. Readmission: 08/07/18 on evaluation today patient presents for reevaluation concerning the ulcer of her right great toe. She was previously discharged on 06/28/18 healed. Nonetheless she states that this began to show signs of drainage she subsequently went to her primary care provider. Subsequently an x-ray was performed on 08/01/18 which was negative. The patient was also placed on antibiotics at that time. Fortunately they should have been effective for the infection. Nonetheless she's been experiencing some improvement but still has [name] lot of drainage coming from the wound itself. 08/14/18 on evaluation  today patient's wound actually does show signs of improvement in regard to the erythema at this point. She has completed the antibiotics. With that being said we did discuss the possibility of placing her in [name] total contact cast as of today although I think that I may want to give this just [name] little bit more time to ensure nothing recurrence as far as  her infection is concerned. I do not want to put in the cast and risk infection at that time if things are not completely resolved. With that being said she is gonna require some debridement today. 08/21/18 on evaluation today patient actually appears to be doing okay in regard to her toe ulcer. She's been tolerating the dressing changes without complication. With that being said it does appear that she is ready and in fact I think it's appropriate for [place] to go ahead and initiate the total contact cast today. Nonetheless she will require some sharp debridement to prepare the wound for application. Overall I feel like things have been progressing well but we do need to do something to get this to close more readily. 08/24/18 patient seen today for reevaluation after having had the total contact cast applied on Tuesday. She seems to have done very well the wound appears to be doing great and overall I'm pleased with the progress that she's made. There were no abnormal areas of rubbing from the cast on her lower extremity. 08/30/18 on evaluation today patient actually appears to be completely healed in regard to her plantar toe ulcer. She tells me at this point she's been having [name] lot of issues with the cast. She almost fell [name] couple of times the state shall the step of her dog [name] couple times as well. This is been [name] very frustrating process for her other nonetheless she has completely healed the wound which is excellent news. Overall there does not appear to be the evidence of infection at this time which is great news. 09/11/18 evaluation today patient  presents for follow-up concerning her great toe ulcer on the left which has unfortunately reopened since I last saw her which was only [name] couple of weeks ago. Unfortunately she was not able to get in to get the shoe and potentially the AFO that's gonna be necessary due to her left foot drop. She continues with offloading shoe but this is not enough to prevent her from reopening it appears. When we last had her in the total contact cast she did well from [name] healing standpoint but unfortunately the wound reopened as soon as she came out of the cast within just [name] couple of weeks. 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 This is also part of the reason why she falls on [name] regular basis. I just do not believe that is gonna be safe for [place] to reinitiate the total contact cast as last time we had this on she fell 3 times one week which is definitely not normal for her. 09/18/18 upon evaluation today the patient actually appears to be doing about the same in regard to her toe ulcer. She did not contact [employer] as I asked her to even though I had given her the prescription. In fact she actually states that she has no idea where the prescription is. She did apparently call [employer] and they told her that all she needed to do was bring the prescription in order to be able to be seen and work on getting the AFO for her left foot. With all that being said she still does not have an appointment and I'm not sure were things stand that regard. I will give her [name]  new prescription today in order to contact them to get this set up. 09/25/18 on evaluation today patient actually appears to be doing about the same in regard to her toes ulcer. She does have [name] small areas which seems to have [name] lot of callous buildup around the edge of  the wound which is going to need sharp debridement today. She still is waiting to be scheduled for evaluation with [employer] for possibility of an AFO. She states there supposed to call her tomorrow to get this set up. Unfortunately it does appear that her foot specifically the toe area is showing signs of erythema. There does not appear to be any systemic infection which is in these good news. 10/02/18 on evaluation today patient actually appears to be doing about the same in regard to her toe ulcer. This really has not done too well although it's not significantly larger it's also not significantly smaller. She has been tolerating the dressing changes without complication. She actually has her appointment with [facility] and [place] tomorrow to hopefully be measured for obtaining and AFO splint. I think this would be helpful preventing this from reoccurring. We had contemplated starting the cast this week although to be honest I am reluctant to do that as she's been having nausea, vomiting, and seizure activity over the past three days. She has [name] history of seizures and have been told is nothing that can be done for these. With that being said I do believe that along with the seizures have the nausea vomiting which upon further questioning doesn't seem to be the normal for her and makes me concerned for the possibility of infection or something else going on. I discussed this with the patient and her mother during the office visit today. I do not feel the wound is effective but maybe something else. The responses this was "this just happens to her at times and we don't know why". They did not seem to be interested in going to the hospital to have this checked out further. 10/09/18 on evaluation today patient presents for follow-up concerning her ongoing toe ulcer. She has been tolerating the dressing changes without complication. Fortunately there does not appear to be any evidence of infection which is  great news however I do think that the patient would benefit from going ahead for with the total contact cast. She's actually in [name] wheelchair today she tells me that she will use her walker if we initiate the cast. I was very specific about the fact that if we were gonna do the cast I wanted to make sure that she was using the walker in order to prevent any falls. She tells me she does not have stairs that she has to traverse on [name] regular basis at her home. She has not had any seizures since last week again that something that happens to her often she tells me she did talk to [employer] and they said that it may take up to three weeks to get the brace approved for her. Hopefully that will not take that long but nonetheless in the meantime I do think the cast could be of benefit. 10/12/18 on evaluation today patient appears to be doing rather well in regard to her toe ulcer. It's just been [name] few days and already this is significantly improved both as far as overall appearance and size. Fortunately there's no sign of infection. She is here for her first obligatory cast change. 10/19/18 Seen today for follow up and management  of left great toe ulcer. Wound continues to show improvement. Noted small open area with seroussang drainage with palpation. Denies any increased pain or recent fevers during visit. She will continue calcium alginate with offloading shoe. Denies any questions ALINDA, EGOLF [name] ([id number]) [id number] Page 6 of 25 or concerns during visit. 10/26/18 on evaluation today patient appears to be doing about the same as when I last saw her in regard to her wound bed. Fortunately there does not appear to be any signs of infection. Unfortunately she continues to have [name] breakdown in regard to the toe region any time that she is not in the cast. It takes almost no time at all for this to happen. Nonetheless she still has not heard anything from the brace being made by  [employer] as to when exactly this will be available to her. Fortunately there is no signs of infection at this time. 10/30/18 on evaluation today patient presents for application of the total contact cast as we just received him this morning. Fortunately we are gonna be able to apply this to her today which is great news. She continues to have no significant pain which is good news. Overall I do feel like things have been improving while she was the cast is when she doesn't have [name] cast that things get worse. She still has not really heard anything from [facility] regarding her brace. 11/02/18 upon evaluation today patient's wound already appears to be doing significantly better which is good news. Fortunately there does not appear to be any signs of infection also good news. Overall I do think the total contact cast as before is helping to heal this area unfortunately it's just not gonna likely keep the area closed and healed without her getting her brace at least. Again the foot drop is [name] significant issue for her. 11/09/18 on evaluation today patient appears to be doing excellent in regard to her toe ulcer which in fact is completely healed. Fortunately we finally got the situation squared away with the paperwork which was needed to proceed with getting her brace approved by Medicaid. I have filled that out unfortunately that information has been sent to the orthopedic office that I worked at 2 1/2 years ago and not tired Current wound care measures. Fortunately she seems to be doing very well at this time. 11/23/18 on evaluation today patient appears to be doing More Poorly Compared to Last Time I Saw Her. At [facility] She Had Completely Healed. Currently she is continuing to have issues with reopening. She states that she just found out that the brace was approved through Medicaid now she just has to go get measured in order to have this fitted for her and then made. Subsequently she does not have an  appointment for this yet that is going to complicate things we obviously cannot put her back in the cast if we do not have everything measured because they're not gonna be able to measure her foot while she is in the cast. 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 Obviously this is unfortunate and does have me somewhat worried as well. 11/30/18 on evaluation today patient's toe ulcer actually appears to be doing fairly well. The good news is she will be getting her brace in the shoes next week on Wednesday. Hopefully we will be able to get this to heal without having to  go back in the cast however she may need the cast in order to get the wound completely heal and then go from there. Fortunately there's no signs of infection at this time. 12/07/18 on evaluation today patient fortunately did receive her brace and she states she could tell this definitely makes her walk better. With that being said she's been having issues with her toe where she noticed yesterday there was [name] lot of tissue that was loosing off this appears to be much larger than what it was previous. She also states that her leg has been read putting much across the top of her foot just about the ankle although this seems to be receiving somewhat. The total area is still red and appears to be someone infected as best I can tell. She is previously taken Bactrim and that may be [name] good option for her today as well. We are gonna see what I wound culture shows as well and I think that this is definitely appropriate. With that being said outside of the culture I still need to initiate something in the interim and that's what I'm gonna go ahead and select Bactrim is [name] good option for her. 12/14/18 on evaluation today patient appears to be doing better in regard to her left great toe ulcer as compared to last week's evaluation. There's still  some erythema although this is significantly improved which is excellent news. Overall I do believe that she is making good progress is still gonna take some time before she is where I would like her to be from the standpoint of being able to place her back into the total contact cast. Hopefully we will be where we need to be by next week. 12/21/18 on evaluation today patient actually appears to be doing poorly in regard to her toe ulcer. She's been tolerating the dressing changes without complication. Fortunately there's no signs of systemic infection although she does have [name] lot of drainage from the toe ulcer and this does seem to be causing some issues at this point. She does have erythema on the distal portion of her toe that appears to be likely cellulitis. 12/28/18 on evaluation today patient actually appears to be doing [name] little better in my pinion in regard to her toe ulcer. With that being said she still does have some evidence of infection at this time and for her culture she had both E. coli as well as enterococcus as organisms noted on evaluation. For that reason I think that though the Keflex likely has treated the E. coli rather well this has really done nothing for the enterococcus. We are going to have to initiate treatment for this specifically. 01/04/19 on evaluation today patient's toe actually appears to be doing better from the standpoint of infection. She currently would like to see about putting the cash back on I think that this is appropriate as long as she takes care of it and keeps it from getting wet. She is gonna have some drainage we can definitely pass this up with Drawtex and alginate to try to prevent as much drainage as possible from causing the problems. With that being said I do want to at least try her with the cast between now and Tuesday. If there any issues we can't continue to use it then I will discontinue the use of the cast at that point. 01/08/19 on evaluation  today patient actually appears to be doing very well as far as her foot ulcer specifically the  great toe on the left is concerned. She did have an area of rubbing on the medial aspect of her left ankle which again is from the cast. Fortunately there's no signs of infection at this point in this appears to be [name] very slight skin breakdown. The patient tells me she felt it rubbing but didn't think it was that bad. Fortunately there is no signs of active infection at this time which is good news. No fevers, chills, nausea, or vomiting noted at this time. 01/15/19 on evaluation today patient actually appears to be doing well in regard to her toe ulcer. Again as previous she seems to do well and she has the cast on which indicates to me that during the time she doesn't have [name] cast on she's putting way too much pressure on this region. Obviously I think that's gonna be an issue as with the current national emergency concerning the Covid-19 Virus it has been recommended that we discontinue the use of total contact casting by the chief medical officer of our company, Dr. Maurine Minister. The reasoning is that if [name] patient becomes sick and cannot come into have the cast removed they could not just leave this on for an additional two weeks. Obviously the hospitals also do not want to receive patient's who are sick into the emergency department to potentially contaminate the region and spread the Covid-19 Virus among other sick individuals within the hospital system. Therefore at this point we are suspending the use of total contact cast until the current emergency subsides. This was all discussed with the patient today as well. 01/22/19 on evaluation today patient's wound on her left great toe appears to be doing slightly worse than previously noted last week. She tells me that she has been on this quite [name] bit in fact she tells me she's been awake for 38 straight hours. This is due to the fact that she's having to care for  grandparents because nobody else will. She has been taking care of them for five the last seven days since I've seen her they both have dementia his is from [name] stroke and her grandmother's was progressive. Nonetheless she states even her mom who knows her condition and situation has only help two of those days to take care of them she's been taking care of the rest. Fortunately there does not appear to be any signs of active infection in regard to her toe at this point although obviously it doesn't look as good as it did previous. I think this is directly related to her not taking off the pressure and friction by way of taking things easy. Though I completely understand what's going on. 01/29/19 on evaluation today patient's tools are actually appears to be showing some signs of improvement today compared to last week's evaluation as far as not necessarily the overall size of the wound but the fact that she has some new skin growth in between the two ends of the wound opening. Overall I feel like she has done well she states that she had [name] family member give her what sounds to be [name] CAM walker boot which has been helpful as well. 02/05/19 on evaluation today patient's wound bed actually appears to be doing significantly better in regard to her overall appearance of the size of the wound. With that being said she is still having an issue with offloading efficiently enough to get this to close. Apparently there is some signs of infection at this point as well  unfortunately. Previously she's done well of Augmentin I really do not see anything that needs to be culture currently but there theme and cellulitis of the foot that I'm seeing I'm gonna go ahead and place her on an antibiotic today to try to help clear this up. 02/12/2019 on evaluation today patient actually appears to be doing poorly in regard to her overall wound status. She tells me she has been using her offloading shoe but actually comes in today  wearing her tennis shoe with the AFO brace. Again as I previously discussed with her this is really not sufficient to allow the area to heal appropriately. Nonetheless she continues to be somewhat noncompliant and I do wonder based on what she has told my nurse in the past as to whether or not [name] good portion of this noncompliance may be recreational drug and alcohol related. She has had [name] history of heroin overdose and this was fairly recently in the past couple of months that have been seeing her. Nonetheless overall I feel like her wound looks significantly worse today compared to what it KINNLEY, PAULSON [name] ([id number]) [id number] Page 7 of 25 was previous. She still has significant erythema despite the Augmentin I am not sure that this is an appropriate medication for her infection I am also concerned that the infection may have gone down into her bone. 02/19/19 on evaluation today patient actually appears to be doing about the same in regard to her toe ulcer. Unfortunately she continues to show signs of bone exposure and infection at this point. There does not appear to be any evidence of worsening of the infection but I'm also not really sure that it's getting significantly better. She is on the Augmentin which should be sufficient for the Staphylococcus aureus infection that she has at this point. With that being said she may need IV antibiotics to more appropriately treat this. We did have [name] discussion today about hyperbaric option therapy. 02/28/19 on evaluation today patient actually appears to be doing much worse in regard to the wound on her left great toe as compared to even my previous evaluation last week. Unfortunately this seems to be training in [name] pretty poor direction. Her toe was actually now starting to angle laterally and I can actually see the entire joint area of the proximal portion of the digit where is the distal portion of the digit again is no longer  even in contact with the joint line. Unfortunately there's [name] lot more necrotic tissue around the edge and the toe appears to be showing signs of becoming gangrenous in my pinion. I'm very concerned about were things stand at this point. She did see infectious disease and they are planning to send in [name] prescription for Sivextro for her and apparently this has been approved. With that being said I don't think she should avoid taking this but at the same time I'm not sure that it's gonna be sufficient to save her toe at this point. She tells me that she still having to care for grandparents which I think is putting quite [name] bit of strain on her foot and specifically the total area and has caused this to break down even to [name] greater degree than would've otherwise been expected. 03/05/19 on evaluation today patient actually appears to be doing quite well in regard to her toe all things considering. She still has bone exposed but there appears to be much less your thing on overall the appearance of the wound and  the toe itself is dramatically improved. She still does have some issues currently obviously with infection she did see vascular as well and there concerned that her blood flow to the toad. For that reason they are setting up for an angiogram next week. 03/14/19 on evaluation today patient appears to be doing very poor in regard to her toe and specifically in regard to the ulceration and the fact that she's starting to notice the toe was leaning even more towards the lateral aspect and the complete joint is visible on the proximal aspect of the joint. Nonetheless she's also noted [name] significant odor and the tip of the toe is turning more dark and necrotic appearing. Overall I think she is getting worse not better as far as this is concerned. For that reason I am recommending at this point that she likely needs to be seen for likely amputation. READMISSION 03/19/2021 This is [name] patient that we cared for  in this clinic for [name] prolonged period of time in 2019 and 2020 with [name] left foot and left first toe wound. 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 Excell Seltzer. In December 2021 she had an ulcer on her right great toe as well as the fourth and fifth toes. 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 She also had an angiogram at that time and underwent angioplasty of the right anterior tibial artery. In any case she claims that the wound on the right foot is closed I did not look at this today which was probably an oversight although I think that should be done next week. 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 According to Dr. Bernette Redbird last review that she was out of the area being cared for by another physician but recently came back to his attention. 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 [name] culture of this area showed E. coli apparently before she came back to see Dr. Excell Seltzer she was supposed to be receiving antibiotics but she did not really take them. Nor is she offloading this area at all. Finally her last hemoglobin A1c listed in epic was in March 2022 at 14.1 she says things are [name] lot better since then although I am not sure. 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 She had this wound at the time. During this admission she had E. coli sepsis [name] TEE was negative. 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 She is still [name] heavy cigarette smoker. She has [name]  history of bipolar disorder seizure disorder ABI in our clinic was 1.05 6/1; left midfoot in the setting of [name] TMA done previously. Round circular wound with [name] "knuckle" of protruding tissue. The problem is that the knuckle was not attached to any of the surrounding granulation and this probed proximally widely I removed [name] large portion of this tissue. This wound goes with considerable undermining laterally. I do not feel any bone there was no purulence but this is [name] deep wound. 6/8; in spite of the debridement I did  last week. She arrives with [name] wound looking exactly the same. [name] protruding "knuckle" of tissue nonadherent to most of the surrounding tissue. There is considerable depth around this from 6-12 o'clock at 2.7 cm and undermining of 1 cm. This does not look overtly infected and the x- ray I did last week was negative for any osseous abnormalities. We have been using silver collagen 6/15; deep tissue culture I did last week showed moderate staph aureus and moderate Pseudomonas. This will definitely require prolonged antibiotic therapy. The pathology on the protuberant area was negative for malignancy fungus etc. 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 We have been using silver collagen. I am going to be prescribing Levaquin for 2 weeks. Her CT scan of the foot is down for 7/5 6/22; CT scan of the foot on 7 5. She says she has hardware in the left leg from her previous fracture. She is on the Levaquin for the deep tissue culture I did that showed methicillin sensitive staph aureus and Pseudomonas. I gave her [name] 2-week supply and she will have another week. She arrives in clinic today with the same protuberant tissue however this is nonadherent to the tissue surrounding it. I am really at [name] loss to explain this unless there is underlying deep tissue infection 6/29; patient presents for 1 week follow-up. She has been using collagen to  the wound bed. She reports taking her antibiotics as prescribed.She has no complaints or issues today. She denies signs of infection. 7/6; patient presents for one week followup. She has been using collagen to the wound bed. She states she is taking Levaquin however at times she is not able to keep it down. She denies signs of infection. 7/13; patient presents for 1 week follow-up. She has been using silver alginate to the wound bed. She still has nausea when taking her antibiotics. She denies signs of infection. 7/20; patient presents for 1 week follow-up. She has been using silver alginate with gentamicin cream to the wound bed. She denies any issues and has no complaints today. She denies signs of infection. 7/27; patient presents for 1 week follow-up. She continues to use silver alginate with gentamicin cream to the wound bed. She reports starting her antibiotics. She has no issues or complaints. Overall she reports stability to the wound. 8/3; patient presents for 1 week follow-up. She has been using silver alginate with gentamicin cream to the wound bed. She reports completing all antibiotics. She has no issues or complaints today. She denies signs of infection. 8/17; patient presents for 2-week follow-up. He is to use silver alginate to the wound bed. She has no issues or complaints today. She denies signs of infection. She reports her pain has improved in her foot since last clinic visit 8/24; patient presents for 1 week follow-up. She continues to use silver alginate to the wound bed. She has no issues or complaints. She denies signs of infection. Pain is stable. 9/7; patient presents for follow-up. She missed her last week appointment due to feeling ill. She continues to use silver alginate. She has [name] new wound to the right lower extremity that is covered in eschar. She states It occurred over the past week and has no idea how it started. She currently denies signs of infection. 9/14;  patient presents for follow-up. T the left foot wound she has been using gentamicin cream and silver alginate. T the right lower extremity wound she has o o  been keeping this covered and has not obtain Santyl. 9/21; patient presents for follow-up. She reports using gentamicin cream and silver alginate to the left foot and Santyl to the right lower extremity wound. She has no issues or complaints today. She denies signs of infection. 9/28; patient presents for follow-up. She reports [name] new wound to her right heel. She states this occurred [name] few days ago and is progressively gotten worse. She has been trying to clean the area with [name] Q-tip and Santyl. She reports stability in the other 2 wounds. She has been using gentamicin cream and silver alginate to the left foot and Santyl to the right lower extremity wound. 10/12; patient presents for follow-up. She reports improvement to the wound beds. She is seeing vein and vascular to discuss the potential of [name] left BKA. She states they are going to do an arteriogram. She continues to use silver alginate with dressing changes to her wounds. ORTENCIA, ASKARI [name] ([id number]) [id number] Page 8 of 25 11/2; patient presents for follow-up. She states she has not been doing dressing changes to the wound beds. She states she is not able to offload the areas. She reports chronic pain to her left foot wound. 11/9; patient presents for follow-up. She came in with only socks on. She states she forgot to put on shoes. It is unclear if she is doing any dressing changes. She currently denies systemic signs of infection. 11/16; patient presents for follow-up. She came again only with socks on. She states she does not wear shoes ever. It is unclear if she does dressing changes. She currently denies systemic signs of infection. 11/23; patient presents for follow-up. She wore her shoes today. It still unclear exactly what dressing she is using for each  wound but she did states she obtained Dakin's solution and has been using this to the left foot wound. She currently denies signs of infection. 11/30; patient presents for follow-up. She has no issues or complaints today. She currently denies signs of infection. 12/7; patient presents for follow-up. She has no issues or complaints today. She has been using Hydrofera Blue to the right heel wound and Dakin solution to the left foot wound. Her right anterior leg wound is healed. She currently denies signs of infection. 12/14; patient presents for follow-up. She has been using Hydrofera Blue to the right heel and Dakin's to the left foot wounds. She has no issues or complaints today. She denies signs of infection. 12/21; patient presents for follow-up. She reports using Hydrofera Blue to the right heel and Dakin's to the left foot wound. She denies signs of infection. 12/28; patient presents for follow-up. She continues to use Dakin's to the left foot wound and Hydrofera Blue to the right heel wound. She denies signs of infection. 1/4; patient presents for follow-up. She has no issues or complaints today. She denies signs of infection. 1/11; patient presents for follow-up. It is unclear if she has been dressing these wounds over the past week. She currently denies signs of infection. 1/18; patient presents for follow-up. She states she has been using Dakin's wet-to-dry dressings to the left foot. She has been using Hydrofera Blue to the right foot foot wound. She states that the anterior right leg wound has reopened and draining serous fluid. She denies signs of infection. 1/25; patient presents for follow-up. She has no issues or complaints today. 2/1; patient presents for follow-up. She has no issues or complaints today. She denies signs of infection. 2/8; patient presents  for follow-up. She has lost her surgical shoes. She did not have [name] dressing to the right heel wound. She currently denies signs  of infection. 2/15; patient presents for follow-up. She reports more pain to the right heel today. She denies purulent drainage Or fever/chills 2/22; patient presents for follow-up. She reports taking clindamycin over the past week. She states that she continues to have pain to her right heel. She reports purulent drainage. Readmission 03/16/2022 Ms. Anay Walter is [name] 47 year old female with [name] past medical history of type 2 diabetes, osteomyelitis to her feet, chronic systolic heart failure and bipolar disorder that presents to the clinic for bilateral feet wounds and right lower extremity wound. She was last seen in our clinic on 12/15/2021. At that time she had purulent drainage coming out of her right plantar foot and I recommended she go to the ED. She states she went to [facility] and has been there for the past 3 months. I cannot see the records. She states she had OR debridement and was on several weeks of IV antibiotics while inpatient. Since discharge she has not been taking care of the wound beds. She had nothing on her feet other than socks today. She currently denies signs of infection. 5/31; patient presents for follow-up. She has been using Dakin's wet-to-dry dressings to the wound beds on her feet bilaterally and antibiotic ointment to the right anterior leg wound. She had [name] wound culture done at last clinic visit that showed moderate Pseudomonas aeruginosa sensitive to ciprofloxacin. She currently denies systemic signs of infection. 6/14; patient presents for follow-up. She received Keystone 5 days ago and has been using this on the wound beds. She states that last week she had to go to the hospital because she had increased warmth and erythema to the right foot. She was started on 2 oral antibiotics. She states she has been taking these. She currently denies systemic signs of infection. She has no issues or complaints today. 6/21; patient presents for follow-up. She  states she has been using Keystone antibiotics to the wound beds. She has no issues or complaints today. She denies signs of infection. 6/28; patient presents for follow-up. She has been using Keystone antibiotics to the wound beds. She has no issues or complaints today. 7/12; patient presents for follow-up. Has been using Keystone antibiotics to the wound beds with calcium alginate. She has no issues or complaints today. She never followed up with her orthopedic surgeon who did the OR debridement to the right foot. We discussed the total contact cast for the left foot and patient would like to do this next week. 7/19; patient presents for follow-up. She has been using Keystone antibiotics with calcium alginate to the wound beds. She has no issues or complaints today. Patient is in agreement to do the total contact cast of the left foot today. She knows to return later this week for the obligatory cast change. 05-13-2022 upon evaluation today patient's wound which she has the cast of the left leg actually appears to be doing significantly better. Fortunately I do not see any signs of active infection locally or systemically which is great news and overall I am extremely pleased with where we stand currently. 7/26; patient presents for follow-up. She has [name] cast in place for the past week. She states it irritated her shin. Other than that she tolerated the cast well. She states she would like [name] break for 1 week from the cast. We have been using  Keystone antibiotic and Aquacel to both wound beds. She denies signs of infection. 8/2; patient presents for follow-up. She has been using Keystone and Aquacel to the wound beds. She denies any issues and has no complaints. She is agreeable to have the cast placed today for the left leg. 06-03-2022 upon evaluation today patient appears to be doing well with regard to her wound she saw some signs of improvement which is great news. Fortunately I do not see any  evidence of active infection locally or systemically at this time which is great news. No fevers, chills, nausea, vomiting, or diarrhea. 8/16; patient presents for follow-up. She has no issues or complaints today. We have been using Keystone and Aquacel to the wound beds. The left lower extremity is in [name] total contact cast. She is tolerated this well. 8/23; patient presents for follow-up. She has had the total contact cast on the left leg for the past week. Unfortunately this has rubbed and broken down the skin to the medial foot. She currently denies signs of infection. She has been using Keystone antibiotic to the right plantar foot wound. BRIYANA, BADMAN [name] ([id number]) [id number] Page 9 of 25 8/30; patient presents for follow-up. We have held off on the total contact cast for the left leg for the past week. Her wound on the left foot has improved and the previous surrounding breakdown of skin has epithelialized. She has been using Keystone antibiotic to both wound beds. She has no issues or complaints today. She denies signs of infection. 9/6; patient presents for follow-up. She has ordered her's Keystone antibiotic and this is arriving this week. She has been doing Dakin's wet-to-dry dressings to the wound beds. She denies signs of infection. She is agreeable to the total contact cast today. 9/13; patient presents for follow-up. She states that the cast caused her left leg shin to be sore. She would like to take [name] break from the cast this week. She has been using Keystone antibiotic to the right plantar foot wound. She denies signs of infection. 9/20; patient presents for follow-up. She has been using Keystone antibiotics to the wound beds with calcium alginate to the right foot wound and Hydrofera Blue to the left foot wound. She is agreeable to having the cast placed today. She has been approved for Apligraf and we will order this for next clinic visit. 9/27; patient  presents for follow-up. We have been using Keystone antibiotic with Hydrofera Blue to the left foot wound under [name] total contact cast. T the right o foot wound she has been using Keystone antibiotic and calcium alginate. She declines [name] total contact cast today. Apligraf is available for placement and she would like to proceed with this. 07-28-2022 upon evaluation today patient appears to be doing well currently in regard to her wound. She is actually showing signs of significant improvement which is great news. Fortunately I do not see any evidence of active infection locally nor systemically at this time. She has been seeing Dr. Mikey Bussing and to be honest has been doing very well with the cast. Subsequently she comes in today with [name] cast on and we did reapply that today as well. She did not really want to she try to talk me out of that but I explained that if she wanted to heal this is really the right way to go. Patient voiced understanding. In regard to her right foot this is actually [name] lot better compared to the last time I saw her which  is also great news. 10/11; patient presents for follow-up. Apligraf and the total contact cast was placed to the left leg at last clinic visit. She states that her right foot wound had burning pain to it with the placement of Apligraf to this area. She has been doing [place] over this area. She denies signs of infection including increased warmth, erythema or purulent drainage. 11/1; 3-week follow-up. The patient fortunately did not have [name] total contact cast or an Apligraf and on the left foot. She has been using Keystone ABD pads and kerlix and her own running shoes She arrives in clinic today with thick callus and [name] very poor surface on the left foot on the right nonviable skin subcutaneous tissue and [name] deep probing hole. 11/15; patient missed her last clinic appointment. She states she has not been dressing the wound beds for the past 2 weeks. She states that at  she had [name] new roommate but is now going back to live with her mother. Apparently its been [name] distracting 2 weeks. Patient currently denies signs of infection. 11/22; patient presents for follow-up. She states she has been using Keystone antibiotic and Dakin's wet-to-dry dressings to the wound beds. She is agreeable for cast placement today. We had ordered Apligraf however this has not been received by our facility. 11/29; Patient had [name] total contact cast placed at last clinic visit and she tolerated this well. We were using silver alginate under the cast. Patient's been using Keystone antibiotic with Aquacel to the right plantar foot wound. She has no issues or complaints today. Apligraf is available for placement today. Patient would like to proceed with this. 12/6; patient presents for follow-up. She had Apligraf placed in standard fashion last clinic visit under the total contact cast to the left lower extremity. She has been using Keystone antibiotic and Aquacel to the right plantar foot wound. She has no issues or complaints today. 12/13; patient presents for follow-up. She has finished 5 Apligraf placements. Was told she would not qualify for more. We have been doing [name] total contact cast to the left lower extremity. She has been using Keystone antibiotic and Aquacel to the right plantar foot wound. She has no issues or complaints today. 12/20; patient presents for follow-up. We have been using Hydrofera Blue with Keystone antibiotic under [name] total contact cast of the left lower extremity. She reports using Keystone antibiotic and silver alginate to the right heel wound. She has no issues or complaints today. 12/27; patient presents with [name] healthy wound on the left midfoot. We have Apligraf to apply that to that more also using [name] total contact cast. On the right we are using Keystone and silver alginate. She is offloading the right heel with [name] surgical shoealthough by her admission she is on her  feet quite [name] bit 1/3; patient presents for follow-up. Apligraf was placed to the wound beds last clinic visit. She was placed in [name] total contact cast to the left lower extremity. She declines [name] total contact cast today. She states that her mother is in the hospital and she cannot adequately get around with the cast on. 1/10; patient presents for follow-up. She declined the total contact cast at last clinic visit. Both wounds have declined in appearance. She states that she has been on her feet and not offloading the wound beds. She currently denies signs of infection. 1/17; patient presents for follow-up. She had the total contact cast along with Apligraf placed last week to the left  lower extremity. She tolerated this well. She has been using Aquacel Ag and Keystone antibiotic to the right heel wound. She currently denies signs of infection. She has no issues or complaints today. 1/24; patient presents for follow-up. We have been using Apligraf to the left foot wound along with [name] total contact cast. She has done well with this. T the right o heel wound she has been using Aquacel Ag and Keystone antibiotic ointment. She has no issues or complaints today. She denies signs of infection. 1/31; patient presents for follow-up. We have been using Apligraf to the left foot wound along with the total contact cast. She continues to do well with this. To the right heel we have been using Aquacel Ag and Keystone antibiotic ointment. She has no issues or complaints today. 12-01-2022 upon evaluation patient is seen today on my schedule due to the fact that she unfortunately was in the hospital yesterday. Her cast needed to come off the only reason she is out of the hospital is due to the fact that they would not take it off at the hospital which is somewhat bewildered reading to me to be perfectly honest. I am not certain why this was but either way she was released and then was placed on my schedule today in order  to get this off and reapply the total contact casting as appropriate. I do not have an Apligraf for her today it was applied last week and today's actually expired yesterday as there was some scheduling conflicts with her being in the hospital. Nonetheless we do not have that for reapplication today but the good news that she is not draining too much and the Apligraf can go for up to 2 weeks so I am going to go ahead and reapply the total contact casting but we are going to leave the Apligraf in place. 2/14; patient presents for follow-up. T the left leg she has had the total contact cast and Apligraf for the past week. She has had no issues with this. T the o o right heel she has been using antibiotic ointment and Aquacel Ag. 2/21; patient presents for follow-up. We have been using Apligraf and [name] total contact cast to the left lower extremity. She is tolerated this well. Unfortunately she is not approved for any more Apligraf per insurance. She has been using antibiotic ointment and Aquacel Ag to the right foot. She has no issues or complaints today. 2/28; patient presents for follow-up. We have been using Hydrofera Blue and antibiotic ointment under the total contact cast to the left lower extremity. He has been using Aquacel Ag with antibiotic ointment to the right plantar foot. She has no issues or complaints today. 3/6; patient presents for follow-up. She did not obtain her gentamicin ointment. She has been using Aquacel Ag to the right plantar foot wound. We have been using Hydrofera Blue with antibiotic ointment under the total contact cast to the left lower extremity. She has no issues or complaints today. 3/12; patient presents for follow-up. She has been using gentamicin ointment and Aquacel Ag to the right plantar foot wound. We have been using Hydrofera Blue with antibiotic ointment under the total contact cast on the left lower extremity foot wound. She has no issues or complaints  today. NERI, VIEYRA [name] ([id number]) [id number] Page 10 of 25 3/20; patient presents for follow-up. She has been using gentamicin ointment and Aquacel Ag to the right plantar foot wound. We have been using Hydrofera Blue with  antibiotic ointment under the total contact cast to the left lower extremity wound. She has no issues or complaints today. 3/27; patient presents for follow-up. We have been using antibiotic ointment and Aquacel Ag to the right plantar foot wound. We have been using Hydrofera Blue with antibiotic ointment under the total contact cast to the left lower extremity. Both wounds are smaller. 01-24-2023 upon evaluation today patient actually appears to be doing better compared to last time I saw her. It has been several weeks since I last saw her but nonetheless I think the total contact casting is doing [name] good job. Fortunately I do not see any evidence of infection or worsening overall which is great news and in general I do believe that she is moving in the appropriate direction. It has been [name] very long road but nonetheless I feel like she is nearing the end at least in regard to the left foot and even the right foot looks much better to be perfectly honest. 01-31-2023 upon evaluation today patient appears to be doing well currently in regard to her wounds. She has been require some sharp debridement which I think is probably bone both sides to remove [name] lot of callus that has built up at this point. I discussed that with her today. Also think total contact cast is doing well for the left foot Emina continue that as well. 4/17; patient presents for follow-up. We have been doing Hydrofera Blue with antibiotic ointment under the total contact cast to the left lower extremity and Aquacel Ag with antibiotic ointment to the right foot wound. She has been using her offloading heel shoe here. 4/24; patient presents for follow-up. We have been doing Hydrofera Blue with  antibiotic ointment under the total contact cast to the left lower extremity and Aquacel Ag with antibiotic ointment to the right foot wound. She has been using her offloading heel shoe here. Wounds are smaller with slough accumulation. 5/1; patient presents for follow-up. We have been doing Hydrofera Blue with antibiotic ointment under the total contact cast to the left lower extremity and Aquacel Ag with antibiotic ointment to the right foot wound. Left foot wound is stable and right foot wound appears smaller. 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 On the left she is using Hydrofera Blue topical antibiotic under [name] total contact cast, on the right plantar heel gentamicin Aquacel Ag. According to our intake nurse both are making slow but steady improvements. 5/15; patient presents for follow-up. We have been doing Hydrofera Blue with antibiotic ointment under the total contact cast on the left and antibiotic ointment with Aquacel Ag on the right. Unfortunately the left foot wound has declined in size and appearance. We also do not have the total contact cast available in office today. Patient denies signs of infection. 5/22; patient presents for follow-up. We have been doing Hydrofera Blue and antibiotic ointment to the left foot and Aquacel Ag with antibiotic ointment to the right foot. We took [name] break from the cast since we did not have it placed at last week. Wounds look stable if not slightly improved. 03-27-2023 patient presents today she has had this For Most 2 Weeks Because She Was in the Hospital and They Did Not T It off. Again That Is Really Not ake Something That She Could Control but Nonetheless We Are Seeing Her at This Point Today for Reevaluation. We Do Want to Go and See about Reapply the Cast. Again We Had All Set Everything up to  Go Ahead and Do This When We Went to Get the Cast Only to Realize That Not Had Come in and We Were out at the Moment. With That Being [place] Spaniel We Will Get  Have T Get the Exam before We Can Reapply the T Contact Casting Going Forward. o otal 6/12; patient presents for follow-up. She has been using Hydrofera Blue to the left foot wound and Aquacel Ag to the right foot wound. No cast was available last week placed on the patient so she has been offloading with her surgical shoe. She has no issues or complaints today. 6/19; patient presents for follow-up. We have been using Hydrofera Blue to the left foot wound under the total contact cast and Aquacel Ag to the right wound. She has no issues or complaints today. The wounds are smaller. 6/26; patient presents for follow-up. We have been using Hydrofera Blue to the left foot wound under the total contact cast and Aquacel Ag to the right foot wound. Wounds are smaller. Patient has no issues or complaints today. 7/3; patient presents for follow-up. We have been using Hydrofera Blue to the left foot wound under the total contact cast and Aquacel Ag to the right foot wound. Wounds Appear smaller. No signs of infection. 7/10; patient presents for follow-up. We have been using Hydrofera Blue to the left foot wound under the total contact cast and Aquacel Ag to the right foot wound. Left foot wound is smaller. Right foot wound is stable. 7/17; patient presents for follow-up. We have been using Hydrofera Blue to the left foot under the total contact cast and Aquacel Ag to the right foot wound. She has no issues or complaints today. 7/24; patient presents for follow-up. We have been using Hydrofera Blue to the left foot under the total contact cast and Aquacel Ag to the right foot wound. Unfortunately she was bit by her dog over the weekend and developed [name] wound to the right lower leg. She has been keeping the area covered. Electronic Signature(s) Signed: 05/17/2023 2:12:08 PM By: Geralyn Corwin DO Entered By: Geralyn Corwin on 05/17/2023  12:51:01 -------------------------------------------------------------------------------- Physical Exam Details Patient Name: Date of Service: Heather Jefferson [name]. 05/17/2023 11:00 [name]: [id number] Patient Account Number: [id number] Date of Birth/Sex: Treating RN: May 18, 1976 (47 y.o. [name], [name] Primary Care Provider: [name] Other Clinician: Referring Provider: Treating Provider/Extender: Chapman Moss in Treatment: [address], Zariya [name] ([id number]) [id number] Page 11 of 25 Constitutional . Cardiovascular . Psychiatric . Notes Right foot: T the plantar heel there is an incision site with increased depth, Granulation tissue and slough at the opening. Left foot: T the medial aspect there o o is an open wound with granulation tissue, Callus and slough build up. No signs of infection to the feet wounds. Right lower extremity: T the anterior aspect there is an open wound with nonviable tissue throughout. Mild increased erythema and warmth to the periwound. o Electronic Signature(s) Signed: 05/17/2023 2:12:08 PM By: Geralyn Corwin DO Entered By: Geralyn Corwin on 05/17/2023 12:52:30 -------------------------------------------------------------------------------- Physician Orders Details Patient Name: Date of Service: Heather Jefferson [name]. 05/17/2023 11:00 [name]: [id number] Patient Account Number: [id number] Date of Birth/Sex: Treating RN: November 21, 1975 (47 y.o. [name], [name] Primary Care Provider: [name] Other Clinician: Referring Provider: Treating Provider/Extender: Chapman Moss in Treatment: 76 Verbal / Phone Orders: No Diagnosis Coding Follow-up Appointments Return Appointment in 1 week. Bathing/ Shower/ Hygiene May shower with  wound dressing protected with water repellent cover or cast protector. No tub bath. Anesthetic (Use 'Patient  Medications' Section for Anesthetic Order Entry) Lidocaine applied to wound bed Off-Loading Total Contact Cast to Left Lower Extremity Open toe surgical shoe - right Wound Treatment Wound #11 - Foot Wound Laterality: Plantar, Right Cleanser: Soap and Water 3 x Per Week/30 Days Discharge Instructions: Gently cleanse wound with antibacterial soap, rinse and pat dry prior to dressing wounds Cleanser: Wound Cleanser 3 x Per Week/30 Days Discharge Instructions: Wash your hands with soap and water. Remove old dressing, discard into plastic bag and place into trash. Cleanse the wound with Wound Cleanser prior to applying [name] clean dressing using gauze sponges, not tissues or cotton balls. Do not scrub or use excessive force. Pat dry using gauze sponges, not tissue or cotton balls. Prim Dressing: Aquacel Extra Hydrofiber Dressing, 2x2 (in/in) ary 3 x Per Week/30 Days Secondary Dressing: ABD Pad 5x9 (in/in) 3 x Per Week/30 Days Discharge Instructions: Cover with ABD pad Secondary Dressing: 3 x Per Week/30 Days LORAINE, BHULLAR [name] ([id number]) [id number] Page 12 of 25 Secured With: Medipore T - 754M Medipore H Soft Cloth Surgical T ape ape, 2x2 (in/yd) 3 x Per Week/30 Days Secured With: [employer] or Non-Sterile 6-ply 4.5x4 (yd/yd) 3 x Per Week/30 Days Discharge Instructions: Apply Kerlix as directed Wound #12 - Foot Wound Laterality: Plantar, Left, Medial Cleanser: Soap and Water 1 x Per Week/30 Days Discharge Instructions: Gently cleanse wound with antibacterial soap, rinse and pat dry prior to dressing wounds Cleanser: Wound Cleanser 1 x Per Week/30 Days Discharge Instructions: Wash your hands with soap and water. Remove old dressing, discard into plastic bag and place into trash. Cleanse the wound with Wound Cleanser prior to applying [name] clean dressing using gauze sponges, not tissues or cotton balls. Do not scrub or use excessive force. Pat dry using gauze  sponges, not tissue or cotton balls. Prim Dressing: Hydrofera Blue Ready Transfer Foam, 2.5x2.5 (in/in) 1 x Per Week/30 Days ary Discharge Instructions: Apply Hydrofera Blue Ready to wound bed as directed Secondary Dressing: Gauze 1 x Per Week/30 Days Discharge Instructions: As directed: dry Secondary Dressing: heel cup 1 x Per Week/30 Days Secured With: Medipore T - 754M Medipore H Soft Cloth Surgical T ape ape, 2x2 (in/yd) 1 x Per Week/30 Days Secured With: [employer] or Non-Sterile 6-ply 4.5x4 (yd/yd) 1 x Per Week/30 Days Discharge Instructions: Apply Kerlix as directed Wound #14 - Lower Leg Wound Laterality: Right, Anterior Cleanser: Normal Saline 1 x Per Day Discharge Instructions: Wash your hands with soap and water. Remove old dressing, discard into plastic bag and place into trash. Cleanse the wound with Normal Saline prior to applying [name] clean dressing using gauze sponges, not tissues or cotton balls. Do not scrub or use excessive force. Pat dry using gauze sponges, not tissue or cotton balls. Topical: Mupirocin Ointment 1 x Per Day Discharge Instructions: Apply as directed by provider. Secondary Dressing: (BORDER) Zetuvit Plus SILICONE BORDER Dressing 4x4 (in/in) 1 x Per Day Discharge Instructions: Please do not put silicone bordered dressings under wraps. Use non-bordered dressing only. Patient Medications llergies: erythromycin base, Neosporin (neo-bac-polym) [name] Notifications Medication Indication Start End 05/17/2023 amoxicillin-pot clavulanate DOSE 1 - oral 875 mg-125 mg tablet - 1 tablet oral twice [name] day x 7 days 05/17/2023 mupirocin DOSE 1 - topical 2 % ointment - Apply once daily to the wound bed Electronic Signature(s) Signed: 05/17/2023 2:12:08 PM By: Geralyn Corwin DO Previous Signature:  05/17/2023 12:27:17 PM Version By: Geralyn Corwin DO Previous Signature: 05/17/2023 12:26:03 PM Version By: Geralyn Corwin DO Entered By: Geralyn Corwin on 05/17/2023  12:59:41 -------------------------------------------------------------------------------- Problem List Details Patient Name: Date of Service: Heather Carry, Martha [name]. 05/17/2023 11:00 [name]: [id number] Patient Account Number: [id number] NOLIE, BIGNELL ([id number]) [id number] Page 13 of 25 Date of Birth/Sex: Treating RN: Apr 14, 1976 (47 y.o. 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 03/16/2022 No Yes E11.621 Type 2 diabetes mellitus with foot ulcer 03/16/2022 No Yes L97.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 03/16/2022 No Yes of skin W54.0XXA Bitten by dog, initial encounter 05/17/2023 No Yes S81.801A Unspecified open wound, right lower leg, initial encounter 05/17/2023 No Yes E11.622 Type 2 diabetes mellitus with other skin ulcer 05/17/2023 No Yes E11.42 Type 2 diabetes mellitus with diabetic polyneuropathy 03/16/2022 No Yes Z79.4 Long term (current) use of insulin 02/22/2023 No Yes Inactive Problems Resolved Problems Electronic Signature(s) Signed: 05/17/2023 2:12:08 PM By: Geralyn Corwin DO Entered By: Geralyn Corwin on 05/17/2023 12:58:44 -------------------------------------------------------------------------------- Progress Note Details Patient Name: Date of Service: Heather Jefferson [name]. 05/17/2023 11:00 [name]: [id number] Patient Account Number: [id number] JALEEA, ALESI [name] ([id number]) [id number] Page 14 of 25 Date of Birth/Sex: Treating  RN: 1975-10-31 (47 y.o. [name], [name] Primary Care Provider: Other Clinician: [name] Referring Provider: Treating Provider/Extender: Chapman Moss in Treatment: 4 Subjective Chief Complaint Information obtained from Patient 03/19/2021; patient referred by Dr. Excell Seltzer who has been looking after her left foot for quite [name] period of time for review of [name] nonhealing area in the left midfoot 03/12/2022; bilateral feet wounds and right lower extremity wound. History of Present Illness (HPI) 01/18/18-She is here for initial evaluation of the left great toe ulcer. She is [name] poor historian in regards to timeframe in detail. She states approximately 4 weeks ago she lacerated her toe on something in the house. She followed up with her primary care who placed her on Bactrim and ultimately [name] second dose of Bactrim prior to coming to wound clinic. She states she has been treating the toe with peroxide, Betadine and [name] Band-Aid. She did not check her blood sugar this morning but checked it yesterday morning it was 327; she is unaware of [name] recent A1c and there are no current records. She saw Dr. she would've orthopedics last week for an old injury to the left ankle, she states he did not see her toe, nor did she bring it to his attention. She smokes approximately 1 pack cigarettes [name] day. Her social situation is concerning, she arrives this morning with her mother who appears extremely intoxicated/under the influence; her mother was asked to leave the room and be monitored by the patient's grandmother. The patient's aunt then accompanied the patient and the room throughout the rest of the appointment. We had [name] lengthy discussion regarding the deleterious effects of uncontrolled hyperglycemia and smoking as it relates to wound healing and overall health. She was strongly encouraged to decrease her smoking and get her diabetes under better control. She states she is currently on [name] diet  and has  cut down her [employer] consumption. The left toe is erythematous, macerated and slightly edematous with malodor present. The edema in her left foot is below her baseline, there is no erythema streaking. We will treat her with Santyl, doxycycline; we have ordered and xray, culture and provided [name] Peg assist surgical shoe and cultured the wound. 01/25/18-She is here in follow-up evaluation for [name] left great toe ulcer and presents with an abscess to her suprapubic area. She states her blood sugars remain elevated, feeling "sick" and if levels are below 250, but she is trying. She has made no attempt to decrease her smoking stating that we "can't take away her food in her cigarettes". She has been compliant with offloading using the PEG assist you. She is using Santyl daily. the culture obtained last week grew staph aureus and Enterococcus faecalis; continues on the doxycycline and Augmentin was added on Monday. The suprapubic area has erythema, no femoral variation, purple discoloration, minimal induration, was accessed with [name] cotton tip applicator with sanguinopurulent drainage, this was cultured, I suspect the current antibiotic treatment will cover and we will not add anything to her current treatment plan. She was advised to go to urgent care or ER with any change in redness, induration or fever. 02/01/18-She is here in follow-up evaluation for left great toe ulcers and [name] new abdominal abscess from last week. She was able to use packing until earlier this week, where she "forgot it was there". She states she was feeling ill with GI symptoms last week and was not taking her antibiotic. She states her glucose levels have been predominantly less than 200, with occasional levels between 200-250. She thinks this was contributing to her GI symptoms as they have resolved without intervention. There continues to be significant laceration to left toe, otherwise it clinically looks stable/improved. There  is now less superficial opening to the lateral aspect of the great toe that was residual blister. We will transition to [facility] to all wounds, she will continue her Augmentin. If there is no change or deterioration next week for reculture. 02/08/18-She is here in follow-up evaluation for left great toe ulcer and abdominal ulcer. There is an improvement in both wounds. She has been wrapping her left toe with coban, not by our direction, which has created an area of discoloration to the medial aspect; she has been advised to NOT use coban secondary to her neuropathy. She states her glucose levels have been high over this last week ranging from 200-350, she continues to smoke. She admits to being less compliant with her offloading shoe. We will continue with same treatment plan and she will follow-up next week. 02/15/18-She is here in follow-up evaluation for left great toe ulcer and abdominal ulcer. The abdominal ulcer is epithelialized. The left great toe ulcer is improved and all injury from last week using the Coban wrap is resolved, the lateral ulcer is healed. She admits to noncompliance with wearing offloading shoe and admits to glucose levels being greater than 300 most of the week. She continues to smoke and expresses no desire to quit. There is one area medially that probes deeper than it has historically, erythema to the toe and dorsal foot has consistently waxed and waned. There is no overt signs of cellulitis or infection but we will culture the wound for any occult infection given the new area of depth and erythema. We will hold off on sensitivities for initiation of antibiotic therapy. 02/22/18-She is here in follow up evaluation for  left great toe ulcer. There is overall significant improvement in both wound appearance, erythema and edema with changes made last week. She was not initiated on antibiotic therapy. Culture obtained last week showed oxacillin sensitive staph aureus,  sensitive to clindamycin. Clindamycin has been called into the pharmacy but she has been instructed to hold off on initiation secondary to overall clinical improvement and her history of antibiotic intolerance. She has been instructed to contact the clinic with any noted changes/deterioration and the wound, erythema, edema and/or pain. She will follow-up next week. She continues to smoke and her glucose levels remain elevated >250; she admits to compliance with offloading shoe 03/01/18 on evaluation today patient appears to be doing fairly well in regard to her left first toe ulcer. She has been tolerating the dressing changes with the [facility] Dressing without complication and overall this has definitely showed signs of improvement according to records as well is what the patient tells me today. I'm very pleased in that regard. She is having no pain today 03/08/18 She is here for follow up evaluation of [name] left great toe ulcer. She remains non-compliant with glucose control and smoking cessation; glucose levels consistently >200. She states that she got new shoe inserts/peg assist. She admits to compliance with offloading. Since my last evaluation there is significant improvement. We will switch to prisma at this time and she will follow up next week. She is noted to be tachycardic at this appointment, heart rate 120s; she has [name] history of heart rate 70-130 according to our records. She admits to extreme agitation r/t personal issues; she was advised to monitor her heartrate and contact her physician if it does not return to [name] more normal range (<100). She takes cardizem twice daily. 03/15/18-She is here in follow-up evaluation for left great toe ulcer. She remains noncompliant with glucose control and smoking cessation. She admits to compliance with wearing offloading shoe. The ulcer is improved/stable and we will continue with the same treatment plan and she will follow-up next week 03/22/18-She is  here for evaluation for left great toe ulcer. There continues to be significant improvement despite recurrent hyperglycemia (over 500 yesterday) and she continues to smoke. She has been compliant with offloading and we will continue with same treatment plan and she will follow-up next week. 03/29/18-She is here for evaluation for left great toe ulcer. Despite continuing to smoke and uncontrolled diabetes she continues to improve. She is compliant with offloading shoe. We will continue with the same treatment plan and she will follow-up next week 04/05/18- She is here in follow up evaluation for [name] left great toe ulcer; she presents with small pustule to left fifth toe (resembles ant bite). She admits to compliance with wearing offloading shoe; continues to smoke or have uncontrolled blood glucose control. There is more callus than usual with evidence of bleeding; she denies known trauma. 04/12/18-She is here for evaluation of left great toe ulcer. Despite noncompliance with glycemic control and smoking she continues to make improvement. She continues to wear offloading shoe. The pustule, that was identified last week, to the left fifth toe is resolved. She will follow-up in 2 weeks 05/03/18-she is seen in follow-up evaluation for [name] left great toe ulcer. She is compliant with offloading, otherwise noncompliant with glycemic control and smoking. She has plateaued and there is minimal improvement noted. We will transition to [facility], replaced the insert to her surgical shoe and she will follow-up in one week 05/10/18- She is here in  follow up evaluation for [name] left great toe ulcer. It appears stable despite measurement change. We will continue with same treatment plan and follow up next week. 05/24/18-She is seen in follow-up evaluation for [name] left great toe ulcer. She remains compliant with offloading, has made significant improvement in her diet, decreasing the amount of sugar/soda. She said her recent A1c  was 10.9 which is lower than. She did see [name] diabetic nutritionist/educator yesterday. She continues to smoke. We will continue with the same treatment plan and she'll follow-up next week. 05/31/18- She is seen in follow-up evaluation for left great toe ulcer. She continues to remain compliant with offloading, continues to make improvement in her diet, increasing her water and decreasing the amount of sugar/soda. She does continue to smoke with no desire to quit. We will apply Prisma to the depth and Hydrofera Blue over. We have not received insurance authorization for oasis. She will follow up next week. 06/07/18-She is seen in follow-up evaluation for left great toe ulcer. It has stalled according to today's measurements although base appears stable. She says she saw [name] diabetic educator yesterday; her average blood sugars are less than 300 which is an improvement for her. She continues to smoke and states "that's my next step" She continues with water over soda. We will order for xray, culture and reinstate ace wrap compression prior to placing apligraf for next week. She is voicing no complaints or concerns. Her dressing will change to iodoflex over the next week in preparation for apligraf. 06/14/18-She is seen in follow-up evaluation for left great toe ulcer. Plain film x-ray performed last week was negative for osteomyelitis. Wound culture obtained KASHONDA, SARKISYAN [name] ([id number]) [id number] Page 15 of 25 last week grew strep B and OSSA; she is initiated on keflex and cefdinir today; there is erythema to the toe which could be from ace wrap compression, she has [name] history of wrapping too tight and has has been encouraged to maintain ace wraps that we place today. We will hold off on application of apligraf today, will apply next week after antibiotic therapy has been initiated. She admits today that she has resumed taking [name] shower with her foot/toe submerged in water, she has  been reminded to keep foot/toe out of the bath water. She will be seen in follow up next week 06/21/18-she is seen in follow-up evaluation for left great toe ulcer. She is tolerating antibiotic therapy with no GI disturbance. The wound is stable. Apligraf was applied today. She has been decreasing her smoking, only had 4 cigarettes yesterday and 1 today. She continues being more compliant in diabetic diet. She will follow-up next week for evaluation of site, if stable will remove at 2 weeks. 06/28/18- She is here in follow up evalution. Apligraf was placed last week, she states the dressing fell off on Tuesday and she was dressing with hydrofera blue. She is healed and will be discharged from the clinic today. She has been instructed to continue with smoking cessation, continue monitoring glucose levels, offloading for an additional 4 weeks and continue with hydrofera blue for additional two weeks for any possible microscopic opening. Readmission: 08/07/18 on evaluation today patient presents for reevaluation concerning the ulcer of her right great toe. She was previously discharged on 06/28/18 healed. Nonetheless she states that this began to show signs of drainage she subsequently went to her primary care provider. Subsequently an x-ray was performed on 08/01/18 which was negative. The patient was also placed on antibiotics at that  time. Fortunately they should have been effective for the infection. Nonetheless she's been experiencing some improvement but still has [name] lot of drainage coming from the wound itself. 08/14/18 on evaluation today patient's wound actually does show signs of improvement in regard to the erythema at this point. She has completed the antibiotics. With that being said we did discuss the possibility of placing her in [name] total contact cast as of today although I think that I may want to give this just [name] little bit more time to ensure nothing recurrence as far as her infection is  concerned. I do not want to put in the cast and risk infection at that time if things are not completely resolved. With that being said she is gonna require some debridement today. 08/21/18 on evaluation today patient actually appears to be doing okay in regard to her toe ulcer. She's been tolerating the dressing changes without complication. With that being said it does appear that she is ready and in fact I think it's appropriate for [place] to go ahead and initiate the total contact cast today. Nonetheless she will require some sharp debridement to prepare the wound for application. Overall I feel like things have been progressing well but we do need to do something to get this to close more readily. 08/24/18 patient seen today for reevaluation after having had the total contact cast applied on Tuesday. She seems to have done very well the wound appears to be doing great and overall I'm pleased with the progress that she's made. There were no abnormal areas of rubbing from the cast on her lower extremity. 08/30/18 on evaluation today patient actually appears to be completely healed in regard to her plantar toe ulcer. She tells me at this point she's been having [name] lot of issues with the cast. She almost fell [name] couple of times the state shall the step of her dog [name] couple times as well. This is been [name] very frustrating process for her other nonetheless she has completely healed the wound which is excellent news. Overall there does not appear to be the evidence of infection at this time which is great news. 09/11/18 evaluation today patient presents for follow-up concerning her great toe ulcer on the left which has unfortunately reopened since I last saw her which was only [name] couple of weeks ago. Unfortunately she was not able to get in to get the shoe and potentially the AFO that's gonna be necessary due to her left foot drop. She continues with offloading shoe but this is not enough to prevent her from  reopening it appears. When we last had her in the total contact cast she did well from [name] healing standpoint but unfortunately the wound reopened as soon as she came out of the cast within just [name] couple of weeks. 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 This is also part of the reason why she falls on [name] regular basis. I just do not believe that is gonna be safe for [place] to reinitiate the total contact cast as last time we had this on she fell 3 times one week which is definitely not normal for her. 09/18/18 upon evaluation today the patient actually appears to be doing about the same in regard to her  toe ulcer. She did not contact [employer] as I asked her to even though I had given her the prescription. In fact she actually states that she has no idea where the prescription is. She did apparently call [employer] and they told her that all she needed to do was bring the prescription in order to be able to be seen and work on getting the AFO for her left foot. With all that being said she still does not have an appointment and I'm not sure were things stand that regard. I will give her [name] new prescription today in order to contact them to get this set up. 09/25/18 on evaluation today patient actually appears to be doing about the same in regard to her toes ulcer. She does have [name] small areas which seems to have [name] lot of callous buildup around the edge of the wound which is going to need sharp debridement today. She still is waiting to be scheduled for evaluation with [employer] for possibility of an AFO. She states there supposed to call her tomorrow to get this set up. Unfortunately it does appear that her foot specifically the toe area is showing signs of erythema. There does not appear to be any systemic infection  which is in these good news. 10/02/18 on evaluation today patient actually appears to be doing about the same in regard to her toe ulcer. This really has not done too well although it's not significantly larger it's also not significantly smaller. She has been tolerating the dressing changes without complication. She actually has her appointment with [facility] tomorrow to hopefully be measured for obtaining and AFO splint. I think this would be helpful preventing this from reoccurring. We had contemplated starting the cast this week although to be honest I am reluctant to do that as she's been having nausea, vomiting, and seizure activity over the past three days. She has [name] history of seizures and have been told is nothing that can be done for these. With that being said I do believe that along with the seizures have the nausea vomiting which upon further questioning doesn't seem to be the normal for her and makes me concerned for the possibility of infection or something else going on. I discussed this with the patient and her mother during the office visit today. I do not feel the wound is effective but maybe something else. The responses this was "this just happens to her at times and we don't know why". They did not seem to be interested in going to the hospital to have this checked out further. 10/09/18 on evaluation today patient presents for follow-up concerning her ongoing toe ulcer. She has been tolerating the dressing changes without complication. Fortunately there does not appear to be any evidence of infection which is great news however I do think that the patient would benefit from going ahead for with the total contact cast. She's actually in [name] wheelchair today she tells me that she will use her walker if we initiate the cast. I was very specific about the fact that if we were gonna do the cast I wanted to make sure that she was using the walker in order to prevent any falls.  She tells me she does not have stairs that she has to traverse on [name] regular basis at her home. She has not had any seizures since last week again that something that happens to her often she tells me she did talk to [employer] and they  said that it may take up to three weeks to get the brace approved for her. Hopefully that will not take that long but nonetheless in the meantime I do think the cast could be of benefit. 10/12/18 on evaluation today patient appears to be doing rather well in regard to her toe ulcer. It's just been [name] few days and already this is significantly improved both as far as overall appearance and size. Fortunately there's no sign of infection. She is here for her first obligatory cast change. 10/19/18 Seen today for follow up and management of left great toe ulcer. Wound continues to show improvement. Noted small open area with seroussang drainage with palpation. Denies any increased pain or recent fevers during visit. She will continue calcium alginate with offloading shoe. Denies any questions or concerns during visit. 10/26/18 on evaluation today patient appears to be doing about the same as when I last saw her in regard to her wound bed. Fortunately there does not appear to be any signs of infection. Unfortunately she continues to have [name] breakdown in regard to the toe region any time that she is not in the cast. It takes almost no time at all for this to happen. Nonetheless she still has not heard anything from the brace being made by [employer] as to when exactly this will be available to her. Fortunately there is no signs of infection at this time. 10/30/18 on evaluation today patient presents for application of the total contact cast as we just received him this morning. Fortunately we are gonna be able to apply this to her today which is great news. She continues to have no significant pain which is good news. Overall I do feel like things have been improving while she was the  cast is when she doesn't have [name] cast that things get worse. She still has not really heard anything from [employer] regarding her brace. 11/02/18 upon evaluation today patient's wound already appears to be doing significantly better which is good news. Fortunately there does not appear to be any signs of infection also good news. Overall I do think the total contact cast as before is helping to heal this area unfortunately it's just not gonna likely keep the MYKAYLAH, BALLMAN [name] ([id number]) [id number] Page 16 of 25 area closed and healed without her getting her brace at least. Again the foot drop is [name] significant issue for her. 11/09/18 on evaluation today patient appears to be doing excellent in regard to her toe ulcer which in fact is completely healed. Fortunately we finally got the situation squared away with the paperwork which was needed to proceed with getting her brace approved by Medicaid. I have filled that out unfortunately that information has been sent to the orthopedic office that I worked at 2 1/2 years ago and not tired Current wound care measures. Fortunately she seems to be doing very well at this time. 11/23/18 on evaluation today patient appears to be doing More Poorly Compared to Last Time I Saw Her. At [facility] She Had Completely Healed. Currently she is continuing to have issues with reopening. She states that she just found out that the brace was approved through Medicaid now she just has to go get measured in order to have this fitted for her and then made. Subsequently she does not have an appointment for this yet that is going to complicate things we obviously cannot put her back in the cast if we do not have everything measured because they're not  gonna be able to measure her foot while she is in the cast. 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  Obviously this is unfortunate and does have me somewhat worried as well. 11/30/18 on evaluation today patient's toe ulcer actually appears to be doing fairly well. The good news is she will be getting her brace in the shoes next week on Wednesday. Hopefully we will be able to get this to heal without having to go back in the cast however she may need the cast in order to get the wound completely heal and then go from there. Fortunately there's no signs of infection at this time. 12/07/18 on evaluation today patient fortunately did receive her brace and she states she could tell this definitely makes her walk better. With that being said she's been having issues with her toe where she noticed yesterday there was [name] lot of tissue that was loosing off this appears to be much larger than what it was previous. She also states that her leg has been read putting much across the top of her foot just about the ankle although this seems to be receiving somewhat. The total area is still red and appears to be someone infected as best I can tell. She is previously taken Bactrim and that may be [name] good option for her today as well. We are gonna see what I wound culture shows as well and I think that this is definitely appropriate. With that being said outside of the culture I still need to initiate something in the interim and that's what I'm gonna go ahead and select Bactrim is [name] good option for her. 12/14/18 on evaluation today patient appears to be doing better in regard to her left great toe ulcer as compared to last week's evaluation. There's still some erythema although this is significantly improved which is excellent news. Overall I do believe that she is making good progress is still gonna take some time before she is where I would like her to be from the standpoint of being able to place her back into the total contact cast. Hopefully we will be where we need to be by next week. 12/21/18 on evaluation today  patient actually appears to be doing poorly in regard to her toe ulcer. She's been tolerating the dressing changes without complication. Fortunately there's no signs of systemic infection although she does have [name] lot of drainage from the toe ulcer and this does seem to be causing some issues at this point. She does have erythema on the distal portion of her toe that appears to be likely cellulitis. 12/28/18 on evaluation today patient actually appears to be doing [name] little better in my pinion in regard to her toe ulcer. With that being said she still does have some evidence of infection at this time and for her culture she had both E. coli as well as enterococcus as organisms noted on evaluation. For that reason I think that though the Keflex likely has treated the E. coli rather well this has really done nothing for the enterococcus. We are going to have to initiate treatment for this specifically. 01/04/19 on evaluation today patient's toe actually appears to be doing better from the standpoint of infection. She currently would like to see about putting the cash back on I think that this is appropriate as long as she takes care of it  and keeps it from getting wet. She is gonna have some drainage we can definitely pass this up with Drawtex and alginate to try to prevent as much drainage as possible from causing the problems. With that being said I do want to at least try her with the cast between now and Tuesday. If there any issues we can't continue to use it then I will discontinue the use of the cast at that point. 01/08/19 on evaluation today patient actually appears to be doing very well as far as her foot ulcer specifically the great toe on the left is concerned. She did have an area of rubbing on the medial aspect of her left ankle which again is from the cast. Fortunately there's no signs of infection at this point in this appears to be [name] very slight skin breakdown. The patient tells me she felt it  rubbing but didn't think it was that bad. Fortunately there is no signs of active infection at this time which is good news. No fevers, chills, nausea, or vomiting noted at this time. 01/15/19 on evaluation today patient actually appears to be doing well in regard to her toe ulcer. Again as previous she seems to do well and she has the cast on which indicates to me that during the time she doesn't have [name] cast on she's putting way too much pressure on this region. Obviously I think that's gonna be an issue as with the current national emergency concerning the Covid-19 Virus it has been recommended that we discontinue the use of total contact casting by the chief medical officer of our company, Dr. Maurine Minister. The reasoning is that if [name] patient becomes sick and cannot come into have the cast removed they could not just leave this on for an additional two weeks. Obviously the hospitals also do not want to receive patient's who are sick into the emergency department to potentially contaminate the region and spread the Covid-19 Virus among other sick individuals within the hospital system. Therefore at this point we are suspending the use of total contact cast until the current emergency subsides. This was all discussed with the patient today as well. 01/22/19 on evaluation today patient's wound on her left great toe appears to be doing slightly worse than previously noted last week. She tells me that she has been on this quite [name] bit in fact she tells me she's been awake for 38 straight hours. This is due to the fact that she's having to care for grandparents because nobody else will. She has been taking care of them for five the last seven days since I've seen her they both have dementia his is from [name] stroke and her grandmother's was progressive. Nonetheless she states even her mom who knows her condition and situation has only help two of those days to take care of them she's been taking care of the rest.  Fortunately there does not appear to be any signs of active infection in regard to her toe at this point although obviously it doesn't look as good as it did previous. I think this is directly related to her not taking off the pressure and friction by way of taking things easy. Though I completely understand what's going on. 01/29/19 on evaluation today patient's tools are actually appears to be showing some signs of improvement today compared to last week's evaluation as far as not necessarily the overall size of the wound but the fact that she has some new skin growth in  between the two ends of the wound opening. Overall I feel like she has done well she states that she had [name] family member give her what sounds to be [name] CAM walker boot which has been helpful as well. 02/05/19 on evaluation today patient's wound bed actually appears to be doing significantly better in regard to her overall appearance of the size of the wound. With that being said she is still having an issue with offloading efficiently enough to get this to close. Apparently there is some signs of infection at this point as well unfortunately. Previously she's done well of Augmentin I really do not see anything that needs to be culture currently but there theme and cellulitis of the foot that I'm seeing I'm gonna go ahead and place her on an antibiotic today to try to help clear this up. 02/12/2019 on evaluation today patient actually appears to be doing poorly in regard to her overall wound status. She tells me she has been using her offloading shoe but actually comes in today wearing her tennis shoe with the AFO brace. Again as I previously discussed with her this is really not sufficient to allow the area to heal appropriately. Nonetheless she continues to be somewhat noncompliant and I do wonder based on what she has told my nurse in the past as to whether or not [name] good portion of this noncompliance may be recreational drug and alcohol  related. She has had [name] history of heroin overdose and this was fairly recently in the past couple of months that have been seeing her. Nonetheless overall I feel like her wound looks significantly worse today compared to what it was previous. She still has significant erythema despite the Augmentin I am not sure that this is an appropriate medication for her infection I am also concerned that the infection may have gone down into her bone. 02/19/19 on evaluation today patient actually appears to be doing about the same in regard to her toe ulcer. Unfortunately she continues to show signs of bone exposure and infection at this point. There does not appear to be any evidence of worsening of the infection but I'm also not really sure that it's getting significantly better. She is on the Augmentin which should be sufficient for the Staphylococcus aureus infection that she has at this point. With that being said she may need IV antibiotics to more appropriately treat this. We did have [name] discussion today about hyperbaric option therapy. 02/28/19 on evaluation today patient actually appears to be doing much worse in regard to the wound on her left great toe as compared to even my previous evaluation last week. Unfortunately this seems to be training in [name] pretty poor direction. Her toe was actually now starting to angle laterally and I can actually see the entire joint area of the proximal portion of the digit where is the distal portion of the digit again is no longer even in contact with the joint line. Unfortunately there's [name] lot more necrotic tissue around the edge and the toe appears to be showing signs of becoming gangrenous in my pinion. I'm very concerned about were things stand at this point. She did see infectious disease and they are planning to send in [name] prescription for Sivextro for her and KEIA, RASK ([id number]) [id number] Page 17 of 25 apparently this has been  approved. With that being said I don't think she should avoid taking this but at the same time I'm not sure that it's gonna  be sufficient to save her toe at this point. She tells me that she still having to care for grandparents which I think is putting quite [name] bit of strain on her foot and specifically the total area and has caused this to break down even to [name] greater degree than would've otherwise been expected. 03/05/19 on evaluation today patient actually appears to be doing quite well in regard to her toe all things considering. She still has bone exposed but there appears to be much less your thing on overall the appearance of the wound and the toe itself is dramatically improved. She still does have some issues currently obviously with infection she did see vascular as well and there concerned that her blood flow to the toad. For that reason they are setting up for an angiogram next week. 03/14/19 on evaluation today patient appears to be doing very poor in regard to her toe and specifically in regard to the ulceration and the fact that she's starting to notice the toe was leaning even more towards the lateral aspect and the complete joint is visible on the proximal aspect of the joint. Nonetheless she's also noted [name] significant odor and the tip of the toe is turning more dark and necrotic appearing. Overall I think she is getting worse not better as far as this is concerned. For that reason I am recommending at this point that she likely needs to be seen for likely amputation. READMISSION 03/19/2021 This is [name] patient that we cared for in this clinic for [name] prolonged period of time in 2019 and 2020 with [name] left foot and left first toe wound. 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 Excell Seltzer. In December 2021 she had an ulcer on her right great toe as well as the fourth and fifth  toes. 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 She also had an angiogram at that time and underwent angioplasty of the right anterior tibial artery. In any case she claims that the wound on the right foot is closed I did not look at this today which was probably an oversight although I think that should be done next week. 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 According to Dr. Bernette Redbird last review that she was out of the area being cared for by another physician but recently came back to his attention. 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 [name] culture of this area showed E. coli apparently before she came back to see Dr. Excell Seltzer she was supposed to be receiving antibiotics but she did not really take them. Nor is she offloading this area at all. Finally her last hemoglobin A1c listed in epic was in March 2022 at 14.1 she says things are [name] lot better since then although I am not sure. 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 She had this wound at the time. During this admission she had E. coli sepsis [name] TEE was negative. 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 She is still [name] heavy  cigarette smoker. She has [name] history of bipolar disorder seizure disorder ABI in our clinic was 1.05 6/1; left midfoot in the setting of [name] TMA done previously. Round circular wound with [name] "knuckle" of protruding tissue. The problem is that the knuckle was not attached to any of the surrounding granulation and this probed proximally widely I removed [name] large portion of this tissue. This wound  goes with considerable undermining laterally. I do not feel any bone there was no purulence but this is [name] deep wound. 6/8; in spite of the debridement I did last week. She arrives with [name] wound looking exactly the same. [name] protruding "knuckle" of tissue nonadherent to most of the surrounding tissue. There is considerable depth around this from 6-12 o'clock at 2.7 cm and undermining of 1 cm. This does not look overtly infected and the x- ray I did last week was negative for any osseous abnormalities. We have been using silver collagen 6/15; deep tissue culture I did last week showed moderate staph aureus and moderate Pseudomonas. This will definitely require prolonged antibiotic therapy. The pathology on the protuberant area was negative for malignancy fungus etc. 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 We have been using silver collagen. I am going to be prescribing Levaquin for 2 weeks. Her CT scan of the foot is down for 7/5 6/22; CT scan of the foot on 7 5. She says she has hardware in the left leg from her previous fracture. She is on the Levaquin for the deep tissue culture I did that showed methicillin sensitive staph aureus and Pseudomonas. I gave her [name] 2-week supply and she will have another week. She arrives in clinic today with the same protuberant tissue however this is nonadherent to the tissue surrounding it. I am really at [name] loss to explain this unless there is underlying deep tissue infection 6/29; patient presents for 1 week follow-up. She has been using collagen to the wound bed. She reports taking her antibiotics as prescribed.She has no complaints or issues today. She denies signs of infection. 7/6; patient presents for one week followup. She has been using collagen to the wound bed. She states she is taking Levaquin however at times she is not able to keep it down. She denies signs of infection. 7/13; patient presents for  1 week follow-up. She has been using silver alginate to the wound bed. She still has nausea when taking her antibiotics. She denies signs of infection. 7/20; patient presents for 1 week follow-up. She has been using silver alginate with gentamicin cream to the wound bed. She denies any issues and has no complaints today. She denies signs of infection. 7/27; patient presents for 1 week follow-up. She continues to use silver alginate with gentamicin cream to the wound bed. She reports starting her antibiotics. She has no issues or complaints. Overall she reports stability to the wound. 8/3; patient presents for 1 week follow-up. She has been using silver alginate with gentamicin cream to the wound bed. She reports completing all antibiotics. She has no issues or complaints today. She denies signs of infection. 8/17; patient presents for 2-week follow-up. He is to use silver alginate to the wound bed. She has no issues or complaints today. She denies signs of infection. She reports her pain has improved in her foot since last clinic visit 8/24; patient presents for 1 week follow-up. She continues to use silver alginate to the wound bed. She has no issues  or complaints. She denies signs of infection. Pain is stable. 9/7; patient presents for follow-up. She missed her last week appointment due to feeling ill. She continues to use silver alginate. She has [name] new wound to the right lower extremity that is covered in eschar. She states It occurred over the past week and has no idea how it started. She currently denies signs of infection. 9/14; patient presents for follow-up. T the left foot wound she has been using gentamicin cream and silver alginate. T the right lower extremity wound she has o o been keeping this covered and has not obtain Santyl. 9/21; patient presents for follow-up. She reports using gentamicin cream and silver alginate to the left foot and Santyl to the right lower extremity wound.  She has no issues or complaints today. She denies signs of infection. 9/28; patient presents for follow-up. She reports [name] new wound to her right heel. She states this occurred [name] few days ago and is progressively gotten worse. She has been trying to clean the area with [name] Q-tip and Santyl. She reports stability in the other 2 wounds. She has been using gentamicin cream and silver alginate to the left foot and Santyl to the right lower extremity wound. 10/12; patient presents for follow-up. She reports improvement to the wound beds. She is seeing vein and vascular to discuss the potential of [name] left BKA. She states they are going to do an arteriogram. She continues to use silver alginate with dressing changes to her wounds. 11/2; patient presents for follow-up. She states she has not been doing dressing changes to the wound beds. She states she is not able to offload the areas. She reports chronic pain to her left foot wound. 11/9; patient presents for follow-up. She came in with only socks on. She states she forgot to put on shoes. It is unclear if she is doing any dressing changes. She currently denies systemic signs of infection. 11/16; patient presents for follow-up. She came again only with socks on. She states she does not wear shoes ever. It is unclear if she does dressing changes. She currently denies systemic signs of infection. 11/23; patient presents for follow-up. She wore her shoes today. It still unclear exactly what dressing she is using for each wound but she did states she obtained Dakin's solution and has been using this to the left foot wound. She currently denies signs of infection. JAZZY, PARMER [name] ([id number]) [id number] Page 18 of 25 11/30; patient presents for follow-up. She has no issues or complaints today. She currently denies signs of infection. 12/7; patient presents for follow-up. She has no issues or complaints today. She has been using  Hydrofera Blue to the right heel wound and Dakin solution to the left foot wound. Her right anterior leg wound is healed. She currently denies signs of infection. 12/14; patient presents for follow-up. She has been using Hydrofera Blue to the right heel and Dakin's to the left foot wounds. She has no issues or complaints today. She denies signs of infection. 12/21; patient presents for follow-up. She reports using Hydrofera Blue to the right heel and Dakin's to the left foot wound. She denies signs of infection. 12/28; patient presents for follow-up. She continues to use Dakin's to the left foot wound and Hydrofera Blue to the right heel wound. She denies signs of infection. 1/4; patient presents for follow-up. She has no issues or complaints today. She denies signs of infection. 1/11; patient presents for follow-up. It is unclear if  she has been dressing these wounds over the past week. She currently denies signs of infection. 1/18; patient presents for follow-up. She states she has been using Dakin's wet-to-dry dressings to the left foot. She has been using Hydrofera Blue to the right foot foot wound. She states that the anterior right leg wound has reopened and draining serous fluid. She denies signs of infection. 1/25; patient presents for follow-up. She has no issues or complaints today. 2/1; patient presents for follow-up. She has no issues or complaints today. She denies signs of infection. 2/8; patient presents for follow-up. She has lost her surgical shoes. She did not have [name] dressing to the right heel wound. She currently denies signs of infection. 2/15; patient presents for follow-up. She reports more pain to the right heel today. She denies purulent drainage Or fever/chills 2/22; patient presents for follow-up. She reports taking clindamycin over the past week. She states that she continues to have pain to her right heel. She reports purulent drainage. Readmission 03/16/2022 Ms. Jackelyne Sayer is [name] 47 year old female with [name] past medical history of type 2 diabetes, osteomyelitis to her feet, chronic systolic heart failure and bipolar disorder that presents to the clinic for bilateral feet wounds and right lower extremity wound. She was last seen in our clinic on 12/15/2021. At that time she had purulent drainage coming out of her right plantar foot and I recommended she go to the ED. She states she went to [facility] and has been there for the past 3 months. I cannot see the records. She states she had OR debridement and was on several weeks of IV antibiotics while inpatient. Since discharge she has not been taking care of the wound beds. She had nothing on her feet other than socks today. She currently denies signs of infection. 5/31; patient presents for follow-up. She has been using Dakin's wet-to-dry dressings to the wound beds on her feet bilaterally and antibiotic ointment to the right anterior leg wound. She had [name] wound culture done at last clinic visit that showed moderate Pseudomonas aeruginosa sensitive to ciprofloxacin. She currently denies systemic signs of infection. 6/14; patient presents for follow-up. She received Keystone 5 days ago and has been using this on the wound beds. She states that last week she had to go to the hospital because she had increased warmth and erythema to the right foot. She was started on 2 oral antibiotics. She states she has been taking these. She currently denies systemic signs of infection. She has no issues or complaints today. 6/21; patient presents for follow-up. She states she has been using Keystone antibiotics to the wound beds. She has no issues or complaints today. She denies signs of infection. 6/28; patient presents for follow-up. She has been using Keystone antibiotics to the wound beds. She has no issues or complaints today. 7/12; patient presents for follow-up. Has been using Keystone antibiotics to the wound beds  with calcium alginate. She has no issues or complaints today. She never followed up with her orthopedic surgeon who did the OR debridement to the right foot. We discussed the total contact cast for the left foot and patient would like to do this next week. 7/19; patient presents for follow-up. She has been using Keystone antibiotics with calcium alginate to the wound beds. She has no issues or complaints today. Patient is in agreement to do the total contact cast of the left foot today. She knows to return later this week for the obligatory cast  change. 05-13-2022 upon evaluation today patient's wound which she has the cast of the left leg actually appears to be doing significantly better. Fortunately I do not see any signs of active infection locally or systemically which is great news and overall I am extremely pleased with where we stand currently. 7/26; patient presents for follow-up. She has [name] cast in place for the past week. She states it irritated her shin. Other than that she tolerated the cast well. She states she would like [name] break for 1 week from the cast. We have been using Keystone antibiotic and Aquacel to both wound beds. She denies signs of infection. 8/2; patient presents for follow-up. She has been using Keystone and Aquacel to the wound beds. She denies any issues and has no complaints. She is agreeable to have the cast placed today for the left leg. 06-03-2022 upon evaluation today patient appears to be doing well with regard to her wound she saw some signs of improvement which is great news. Fortunately I do not see any evidence of active infection locally or systemically at this time which is great news. No fevers, chills, nausea, vomiting, or diarrhea. 8/16; patient presents for follow-up. She has no issues or complaints today. We have been using Keystone and Aquacel to the wound beds. The left lower extremity is in [name] total contact cast. She is tolerated this well. 8/23; patient  presents for follow-up. She has had the total contact cast on the left leg for the past week. Unfortunately this has rubbed and broken down the skin to the medial foot. She currently denies signs of infection. She has been using Keystone antibiotic to the right plantar foot wound. 8/30; patient presents for follow-up. We have held off on the total contact cast for the left leg for the past week. Her wound on the left foot has improved and the previous surrounding breakdown of skin has epithelialized. She has been using Keystone antibiotic to both wound beds. She has no issues or complaints today. She denies signs of infection. 9/6; patient presents for follow-up. She has ordered her's Keystone antibiotic and this is arriving this week. She has been doing Dakin's wet-to-dry dressings to the wound beds. She denies signs of infection. She is agreeable to the total contact cast today. 9/13; patient presents for follow-up. She states that the cast caused her left leg shin to be sore. She would like to take [name] break from the cast this week. She has been using Keystone antibiotic to the right plantar foot wound. She denies signs of infection. 9/20; patient presents for follow-up. She has been using Keystone antibiotics to the wound beds with calcium alginate to the right foot wound and Hydrofera Blue to the left foot wound. She is agreeable to having the cast placed today. She has been approved for Apligraf and we will order this for next clinic visit. TORII, ROYSE [name] ([id number]) [id number] Page 56 of 25 9/27; patient presents for follow-up. We have been using Keystone antibiotic with Hydrofera Blue to the left foot wound under [name] total contact cast. T the right o foot wound she has been using Keystone antibiotic and calcium alginate. She declines [name] total contact cast today. Apligraf is available for placement and she would like to proceed with this. 07-28-2022 upon evaluation  today patient appears to be doing well currently in regard to her wound. She is actually showing signs of significant improvement which is great news. Fortunately I do not see any evidence of active  infection locally nor systemically at this time. She has been seeing Dr. Mikey Bussing and to be honest has been doing very well with the cast. Subsequently she comes in today with [name] cast on and we did reapply that today as well. She did not really want to she try to talk me out of that but I explained that if she wanted to heal this is really the right way to go. Patient voiced understanding. In regard to her right foot this is actually [name] lot better compared to the last time I saw her which is also great news. 10/11; patient presents for follow-up. Apligraf and the total contact cast was placed to the left leg at last clinic visit. She states that her right foot wound had burning pain to it with the placement of Apligraf to this area. She has been doing [place] over this area. She denies signs of infection including increased warmth, erythema or purulent drainage. 11/1; 3-week follow-up. The patient fortunately did not have [name] total contact cast or an Apligraf and on the left foot. She has been using Keystone ABD pads and kerlix and her own running shoes She arrives in clinic today with thick callus and [name] very poor surface on the left foot on the right nonviable skin subcutaneous tissue and [name] deep probing hole. 11/15; patient missed her last clinic appointment. She states she has not been dressing the wound beds for the past 2 weeks. She states that at she had [name] new roommate but is now going back to live with her mother. Apparently its been [name] distracting 2 weeks. Patient currently denies signs of infection. 11/22; patient presents for follow-up. She states she has been using Keystone antibiotic and Dakin's wet-to-dry dressings to the wound beds. She is agreeable for cast placement today. We had ordered Apligraf  however this has not been received by our facility. 11/29; Patient had [name] total contact cast placed at last clinic visit and she tolerated this well. We were using silver alginate under the cast. Patient's been using Keystone antibiotic with Aquacel to the right plantar foot wound. She has no issues or complaints today. Apligraf is available for placement today. Patient would like to proceed with this. 12/6; patient presents for follow-up. She had Apligraf placed in standard fashion last clinic visit under the total contact cast to the left lower extremity. She has been using Keystone antibiotic and Aquacel to the right plantar foot wound. She has no issues or complaints today. 12/13; patient presents for follow-up. She has finished 5 Apligraf placements. Was told she would not qualify for more. We have been doing [name] total contact cast to the left lower extremity. She has been using Keystone antibiotic and Aquacel to the right plantar foot wound. She has no issues or complaints today. 12/20; patient presents for follow-up. We have been using Hydrofera Blue with Keystone antibiotic under [name] total contact cast of the left lower extremity. She reports using Keystone antibiotic and silver alginate to the right heel wound. She has no issues or complaints today. 12/27; patient presents with [name] healthy wound on the left midfoot. We have Apligraf to apply that to that more also using [name] total contact cast. On the right we are using Keystone and silver alginate. She is offloading the right heel with [name] surgical shoealthough by her admission she is on her feet quite [name] bit 1/3; patient presents for follow-up. Apligraf was placed to the wound beds last clinic visit. She was placed in [name]  total contact cast to the left lower extremity. She declines [name] total contact cast today. She states that her mother is in the hospital and she cannot adequately get around with the cast on. 1/10; patient presents for follow-up. She  declined the total contact cast at last clinic visit. Both wounds have declined in appearance. She states that she has been on her feet and not offloading the wound beds. She currently denies signs of infection. 1/17; patient presents for follow-up. She had the total contact cast along with Apligraf placed last week to the left lower extremity. She tolerated this well. She has been using Aquacel Ag and Keystone antibiotic to the right heel wound. She currently denies signs of infection. She has no issues or complaints today. 1/24; patient presents for follow-up. We have been using Apligraf to the left foot wound along with [name] total contact cast. She has done well with this. T the right o heel wound she has been using Aquacel Ag and Keystone antibiotic ointment. She has no issues or complaints today. She denies signs of infection. 1/31; patient presents for follow-up. We have been using Apligraf to the left foot wound along with the total contact cast. She continues to do well with this. To the right heel we have been using Aquacel Ag and Keystone antibiotic ointment. She has no issues or complaints today. 12-01-2022 upon evaluation patient is seen today on my schedule due to the fact that she unfortunately was in the hospital yesterday. Her cast needed to come off the only reason she is out of the hospital is due to the fact that they would not take it off at the hospital which is somewhat bewildered reading to me to be perfectly honest. I am not certain why this was but either way she was released and then was placed on my schedule today in order to get this off and reapply the total contact casting as appropriate. I do not have an Apligraf for her today it was applied last week and today's actually expired yesterday as there was some scheduling conflicts with her being in the hospital. Nonetheless we do not have that for reapplication today but the good news that she is not draining too much and the  Apligraf can go for up to 2 weeks so I am going to go ahead and reapply the total contact casting but we are going to leave the Apligraf in place. 2/14; patient presents for follow-up. T the left leg she has had the total contact cast and Apligraf for the past week. She has had no issues with this. T the o o right heel she has been using antibiotic ointment and Aquacel Ag. 2/21; patient presents for follow-up. We have been using Apligraf and [name] total contact cast to the left lower extremity. She is tolerated this well. Unfortunately she is not approved for any more Apligraf per insurance. She has been using antibiotic ointment and Aquacel Ag to the right foot. She has no issues or complaints today. 2/28; patient presents for follow-up. We have been using Hydrofera Blue and antibiotic ointment under the total contact cast to the left lower extremity. He has been using Aquacel Ag with antibiotic ointment to the right plantar foot. She has no issues or complaints today. 3/6; patient presents for follow-up. She did not obtain her gentamicin ointment. She has been using Aquacel Ag to the right plantar foot wound. We have been using Hydrofera Blue with antibiotic ointment under the total contact cast  to the left lower extremity. She has no issues or complaints today. 3/12; patient presents for follow-up. She has been using gentamicin ointment and Aquacel Ag to the right plantar foot wound. We have been using Hydrofera Blue with antibiotic ointment under the total contact cast on the left lower extremity foot wound. She has no issues or complaints today. 3/20; patient presents for follow-up. She has been using gentamicin ointment and Aquacel Ag to the right plantar foot wound. We have been using Hydrofera Blue with antibiotic ointment under the total contact cast to the left lower extremity wound. She has no issues or complaints today. 3/27; patient presents for follow-up. We have been using antibiotic  ointment and Aquacel Ag to the right plantar foot wound. We have been using Hydrofera Blue with antibiotic ointment under the total contact cast to the left lower extremity. Both wounds are smaller. 01-24-2023 upon evaluation today patient actually appears to be doing better compared to last time I saw her. It has been several weeks since I last saw her but nonetheless I think the total contact casting is doing [name] good job. Fortunately I do not see any evidence of infection or worsening overall which is great news and in general I do believe that she is moving in the appropriate direction. It has been [name] very long road but nonetheless I feel like she is nearing the end at least in regard to the left foot and even the right foot looks much better to be perfectly honest. 01-31-2023 upon evaluation today patient appears to be doing well currently in regard to her wounds. She has been require some sharp debridement which I think is probably bone both sides to remove [name] lot of callus that has built up at this point. I discussed that with her today. Also think total contact cast is doing well for DIANELYS, SCINTO [name] ([id number]) [id number] Page 20 of 25 the left foot Emina continue that as well. 4/17; patient presents for follow-up. We have been doing Hydrofera Blue with antibiotic ointment under the total contact cast to the left lower extremity and Aquacel Ag with antibiotic ointment to the right foot wound. She has been using her offloading heel shoe here. 4/24; patient presents for follow-up. We have been doing Hydrofera Blue with antibiotic ointment under the total contact cast to the left lower extremity and Aquacel Ag with antibiotic ointment to the right foot wound. She has been using her offloading heel shoe here. Wounds are smaller with slough accumulation. 5/1; patient presents for follow-up. We have been doing Hydrofera Blue with antibiotic ointment under the total contact cast  to the left lower extremity and Aquacel Ag with antibiotic ointment to the right foot wound. Left foot wound is stable and right foot wound appears smaller. 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 On the left she is using Hydrofera Blue topical antibiotic under [name] total contact cast, on the right plantar heel gentamicin Aquacel Ag. According to our intake nurse both are making slow but steady improvements. 5/15; patient presents for follow-up. We have been doing Hydrofera Blue with antibiotic ointment under the total contact cast on the left and antibiotic ointment with Aquacel Ag on the right. Unfortunately the left foot wound has declined in size and appearance. We also do not have the total contact cast available in office today. Patient denies signs of infection. 5/22; patient presents for follow-up. We have been doing Hydrofera Blue and antibiotic ointment to the left foot and Aquacel Ag with  antibiotic ointment to the right foot. We took [name] break from the cast since we did not have it placed at last week. Wounds look stable if not slightly improved. 03-27-2023 patient presents today she has had this For Most 2 Weeks Because She Was in the Hospital and They Did Not T It off. Again That Is Really Not ake Something That She Could Control but Nonetheless We Are Seeing Her at This Point Today for Reevaluation. We Do Want to Go and See about Reapply the Cast. Again We Had All Set Everything up to Go Ahead and Do This When We Went to Get the Cast Only to Realize That Not Had Come in and We Were out at the Moment. With That Being [place] Spaniel We Will Get Have T Get the Exam before We Can Reapply the T Contact Casting Going Forward. o otal 6/12; patient presents for follow-up. She has been using Hydrofera Blue to the left foot wound and Aquacel Ag to the right foot wound. No cast was available last week placed on the patient so she has been offloading with her surgical shoe. She has no issues or complaints  today. 6/19; patient presents for follow-up. We have been using Hydrofera Blue to the left foot wound under the total contact cast and Aquacel Ag to the right wound. She has no issues or complaints today. The wounds are smaller. 6/26; patient presents for follow-up. We have been using Hydrofera Blue to the left foot wound under the total contact cast and Aquacel Ag to the right foot wound. Wounds are smaller. Patient has no issues or complaints today. 7/3; patient presents for follow-up. We have been using Hydrofera Blue to the left foot wound under the total contact cast and Aquacel Ag to the right foot wound. Wounds Appear smaller. No signs of infection. 7/10; patient presents for follow-up. We have been using Hydrofera Blue to the left foot wound under the total contact cast and Aquacel Ag to the right foot wound. Left foot wound is smaller. Right foot wound is stable. 7/17; patient presents for follow-up. We have been using Hydrofera Blue to the left foot under the total contact cast and Aquacel Ag to the right foot wound. She has no issues or complaints today. 7/24; patient presents for follow-up. We have been using Hydrofera Blue to the left foot under the total contact cast and Aquacel Ag to the right foot wound. Unfortunately she was bit by her dog over the weekend and developed [name] wound to the right lower leg. She has been keeping the area covered. Objective Constitutional Vitals Time Taken: 11:14 AM, Height: 69 in, Weight: 178 lbs, BMI: 26.3, Temperature: 98.5 F, Pulse: 60 bpm, Respiratory Rate: 18 breaths/min, Blood Pressure: 120/54 mmHg. General Notes: Right foot: T the plantar heel there is an incision site with increased depth, Granulation tissue and slough at the opening. Left foot: T the o o medial aspect there is an open wound with granulation tissue, Callus and slough build up. No signs of infection to the feet wounds. Right lower extremity: T the o anterior aspect there is an  open wound with nonviable tissue throughout. Mild increased erythema and warmth to the periwound. Integumentary (Hair, Skin) Wound #11 status is Open. Original cause of wound was Surgical Injury. The date acquired was: 12/01/2021. The wound has been in treatment 61 weeks. The wound is located on the Right,Plantar Foot. The wound measures 0.4cm length x 0.4cm width x 0.8cm depth; 0.126cm^2 area and 0.101cm^3 volume.  There is Fat Layer (Subcutaneous Tissue) exposed. There is [name] small amount of serous drainage noted. The wound margin is distinct with the outline attached to the wound base. There is small (1-33%) pink, pale granulation within the wound bed. There is [name] small (1-33%) amount of necrotic tissue within the wound bed including Adherent Slough. Wound #12 status is Open. Original cause of wound was Pressure Injury. The date acquired was: 03/16/2020. The wound has been in treatment 61 weeks. The wound is located on the Left,Medial,Plantar Foot. The wound measures 0.3cm length x 0.3cm width x 0.4cm depth; 0.071cm^2 area and 0.028cm^3 volume. There is Fat Layer (Subcutaneous Tissue) exposed. There is [name] medium amount of serosanguineous drainage noted. The wound margin is flat and intact. There is large (67-100%) pink, pale granulation within the wound bed. There is no necrotic tissue within the wound bed. Wound #14 status is Open. Original cause of wound was Trauma. The date acquired was: 05/14/2023. The wound is located on the Right,Anterior Lower Leg. The wound measures 1.9cm length x 1cm width x 0.2cm depth; 1.492cm^2 area and 0.298cm^3 volume. There is Fat Layer (Subcutaneous Tissue) exposed. There is no tunneling or undermining noted. There is [name] small amount of serosanguineous drainage noted. The wound margin is distinct with the outline attached to the wound base. There is no granulation within the wound bed. There is [name] large (67-100%) amount of necrotic tissue within the wound bed including Adherent  Slough. 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 I debrided nonviable tissue and we continued Hydrofera Blue and the total contact cast here. The right foot wound had some fluid collection and I was able to debride the area well down to healthy granulation tissue. I recommended continuing Aquacel Ag here and aggressive offloading. Unfortunately she experienced [name] dog bite to the right leg and has developed [name] wound. I debrided nonviable tissue here and recommended mupirocin ointment daily. I will also give her [name] course of Augmentin as the area looks suspicious for infection. Follow-up in 1 week. Procedures Wound #11 Pre-procedure diagnosis of Wound #11 is an Open Surgical Wound located on the Right,Plantar Foot . There was [name] Excisional Skin/Subcutaneous Tissue Debridement with [name] total area of 0.38 sq cm performed by Geralyn Corwin, MD. With the following instrument(s): Curette to remove Non-Viable tissue/material. Material removed includes Callus, Subcutaneous Tissue, Slough, and Skin: Dermis after achieving pain control using Lidocaine 2% Topical Gel. No specimens were taken. [name] time out was conducted at 11:38, prior to the start of the procedure. There was no bleeding. The procedure was tolerated well with [name] pain level of 0 throughout and [name] pain level of 0  following the procedure. Post Debridement Measurements: 0.7cm length x 0.7cm width x 1cm depth; 0.385cm^3 volume. Character of Wound/Ulcer Post Debridement is improved. Post procedure Diagnosis Wound #11: Same as Pre-Procedure Wound #12 Pre-procedure diagnosis of Wound #12 is [name] Diabetic Wound/Ulcer of the Lower Extremity located on the Left,Medial,Plantar  Foot .Severity of Tissue Pre Debridement is: Fat layer exposed. There was [name] Selective/Open Wound Skin/Dermis Debridement with [name] total area of 0.07 sq cm performed by Geralyn Corwin, MD. With the following instrument(s): Curette to remove Non-Viable tissue/material. Material removed includes Callus, Slough, and Skin: Dermis after achieving pain control using Lidocaine 2% Topical Gel. No specimens were taken. [name] time out was conducted at 11:38, prior to the start of the procedure. There was no bleeding. The procedure was tolerated well with [name] pain level of 0 throughout and [name] pain level of 0 following the procedure. Post Debridement Measurements: 0.3cm length x 0.3cm width x 0.4cm depth; 0.028cm^3 volume. Character of Wound/Ulcer Post Debridement is improved. Severity of Tissue Post Debridement is: Fat layer exposed. Post procedure Diagnosis Wound #12: Same as Pre-Procedure Pre-procedure diagnosis of Wound #12 is [name] Diabetic Wound/Ulcer of the Lower Extremity located on the Left,Medial,Plantar Foot . There was [name] T Contact otal Cast Procedure by Geralyn Corwin, MD. Post procedure Diagnosis Wound #12: Same as Pre-Procedure Wound #14 Pre-procedure diagnosis of Wound #14 is [name] T be determined located on the Right,Anterior Lower Leg . There was [name] Selective/Open Wound Non-Viable Tissue o Debridement with [name] total area of 1.49 sq cm performed by Geralyn Corwin, MD. With the following instrument(s): Blade, and Forceps to remove Non-Viable tissue/material. Material removed includes Eschar and Slough and after achieving pain control using Lidocaine 2% T  opical Gel. No specimens were taken. [name] time out was conducted at 11:38, prior to the start of the procedure. There was no bleeding. The procedure was tolerated well with [name] pain level of 0 throughout and [name] pain level of 0 following the procedure. Post Debridement Measurements: 1.9cm length x 1cm width x 0.2cm depth; 0.298cm^3 volume. Character of Wound/Ulcer Post Debridement is improved. Post procedure Diagnosis Wound #14: Same as Pre-Procedure Plan Follow-up Appointments: Return Appointment in 1 week. Bathing/ Shower/ Hygiene: May shower with wound dressing protected with water repellent cover or cast protector. No tub bath. Anesthetic (Use 'Patient Medications' Section for Anesthetic Order Entry): Lidocaine applied to wound bed Off-Loading: T Contact Cast to Left Lower Extremity otal Open toe surgical shoe - right The following medication(s) was prescribed: amoxicillin-pot clavulanate oral 875 mg-125 mg tablet 1 1 tablet oral twice [name] day x 7 days starting 05/17/2023 mupirocin topical 2 % ointment 1 Apply once daily to the wound bed starting 05/17/2023 WOUND #11: - Foot Wound Laterality: Plantar, Right Cleanser: Soap and Water 3 x Per Week/30 Days Discharge Instructions: Gently cleanse wound with antibacterial soap, rinse and pat dry prior to dressing wounds Cleanser: Wound Cleanser 3 x Per Week/30 Days Discharge Instructions: Wash your hands with soap and water. Remove old dressing, discard into plastic bag and place into trash. Cleanse the wound with Wound Cleanser prior to applying [name] clean dressing using gauze sponges, not tissues or cotton balls. Do not scrub or use excessive force. Pat dry using gauze sponges, not tissue or cotton balls. Prim Dressing: Aquacel Extra Hydrofiber Dressing, 2x2 (in/in) 3 x Per Week/30 Days ary Secondary Dressing: ABD Pad 5x9 (in/in) 3 x Per Week/30 Days Discharge Instructions: Cover with ABD pad BRYER, GOTTSCH [name] ([id number])  [id number] Page 22 of 25 Secondary Dressing: 3 x Per Week/30 Days Secured With: Medipore T - 80M Medipore H Soft Cloth Surgical T ape ape, 2x2 (in/yd) 3 x Per Week/30 Days Secured With: [employer] or Non-Sterile 6-ply 4.5x4 (yd/yd) 3 x Per Week/30 Days Discharge Instructions: Apply Kerlix as directed WOUND #  12: - Foot Wound Laterality: Plantar, Left, Medial Cleanser: Soap and Water 1 x Per Week/30 Days Discharge Instructions: Gently cleanse wound with antibacterial soap, rinse and pat dry prior to dressing wounds Cleanser: Wound Cleanser 1 x Per Week/30 Days Discharge Instructions: Wash your hands with soap and water. Remove old dressing, discard into plastic bag and place into trash. Cleanse the wound with Wound Cleanser prior to applying [name] clean dressing using gauze sponges, not tissues or cotton balls. Do not scrub or use excessive force. Pat dry using gauze sponges, not tissue or cotton balls. Prim Dressing: Hydrofera Blue Ready Transfer Foam, 2.5x2.5 (in/in) 1 x Per Week/30 Days ary Discharge Instructions: Apply Hydrofera Blue Ready to wound bed as directed Secondary Dressing: Gauze 1 x Per Week/30 Days Discharge Instructions: As directed: dry Secondary Dressing: heel cup 1 x Per Week/30 Days Secured With: Medipore T - 44M Medipore H Soft Cloth Surgical T ape ape, 2x2 (in/yd) 1 x Per Week/30 Days Secured With: [employer] or Non-Sterile 6-ply 4.5x4 (yd/yd) 1 x Per Week/30 Days Discharge Instructions: Apply Kerlix as directed WOUND #14: - Lower Leg Wound Laterality: Right, Anterior Cleanser: Normal Saline 1 x Per Day/ Discharge Instructions: Wash your hands with soap and water. Remove old dressing, discard into plastic bag and place into trash. Cleanse the wound with Normal Saline prior to applying [name] clean dressing using gauze sponges, not tissues or cotton balls. Do not scrub or use excessive force. Pat dry using gauze sponges, not tissue or  cotton balls. Topical: Mupirocin Ointment 1 x Per Day/ Discharge Instructions: Apply as directed by provider. Secondary Dressing: (BORDER) Zetuvit Plus SILICONE BORDER Dressing 4x4 (in/in) 1 x Per Day/ Discharge Instructions: Please do not put silicone bordered dressings under wraps. Use non-bordered dressing only. 1. In office sharp debridement 2. T contact cast placed in standard fashion otal 3. Hydrofera Blue 4. Aquacel Ag 5. Mupirocin ointment 6. Augmentin 7. Follow-up in 1 week 8. Aggressive offloading Electronic Signature(s) Signed: 05/17/2023 2:12:08 PM By: Geralyn Corwin DO Entered By: Geralyn Corwin on 05/17/2023 12:59:00 -------------------------------------------------------------------------------- ROS/PFSH Details Patient Name: Date of Service: Heather Carry, Finola [name]. 05/17/2023 11:00 [name]: [id number] Patient Account Number: [id number] Date of Birth/Sex: Treating RN: 14-Jul-1976 (47 y.o. [name], [name] Primary Care Provider: [name] Other Clinician: Referring Provider: Treating Provider/Extender: Chapman Moss in Treatment: 20 Information Obtained From Patient Ear/Nose/Mouth/Throat Medical History: Positive for: Chronic sinus problems/congestion; Middle ear problems Hematologic/Lymphatic Medical History: Positive for: Anemia YARELLY, KUBA [name] ([id number]) [id number] Page 23 of 25 Respiratory Medical History: Positive for: Chronic Obstructive Pulmonary Disease (COPD) Cardiovascular Medical History: Positive for: Congestive Heart Failure - EF 33% Endocrine Medical History: Positive for: Type II Diabetes Time with diabetes: 14 years Treated with: Insulin, Oral agents Blood sugar tested every day: No Blood sugar testing results: Bedtime: 176 Genitourinary Medical History: Positive for: End Stage Renal Disease Integumentary (Skin) Medical History: Positive for: History of  pressure wounds Neurologic Medical History: Positive for: Neuropathy - feel and legs HBO Extended History Items Ear/Nose/Mouth/Throat: Ear/Nose/Mouth/Throat: Chronic sinus Middle ear problems problems/congestion Immunizations Pneumococcal Vaccine: Received Pneumococcal Vaccination: No Implantable Devices No devices added Family and Social History Current every day smoker; Marital Status - Single; Alcohol Use: Never; Drug Use: Prior History; Caffeine Use: Daily Electronic Signature(s) Signed: 05/17/2023 2:12:08 PM By: Geralyn Corwin DO Signed: 05/17/2023 4:46:32 PM By: Bonnell Public Entered By: Geralyn Corwin on 05/17/2023 12:59:56 -------------------------------------------------------------------------------- Total Contact Cast Details Patient Name: Date of Service:  [name], Sanae [name]. 05/17/2023 11:00 [name]: [id number] Patient Account Number: [id number] Date of Birth/Sex: Treating RN: 23-Nov-1975 (47 y.o. [name], [name] Primary Care Provider: [name] Other Clinician: KARYSSA, AMARAL ([id number]) [id number] Page 24 of 25 Referring Provider: Treating Provider/Extender: Chapman Moss in Treatment: 61 T Contact Cast Applied for Wound Assessment: otal Wound #12 Left,Medial,Plantar Foot Performed By: Physician Geralyn Corwin, MD Post Procedure Diagnosis Same as Pre-procedure Electronic Signature(s) Signed: 05/17/2023 2:12:08 PM By: Geralyn Corwin DO Signed: 05/17/2023 4:46:32 PM By: Bonnell Public Entered By: Bonnell Public on 05/17/2023 12:05:25 -------------------------------------------------------------------------------- SuperBill Details Patient Name: Date of Service: [name] 05/17/2023 Medical Record Number: [id number] Patient Account Number: [id number] Date of Birth/Sex: Treating RN: 1976/09/14 (47 y.o. [name], [name] Primary Care Provider: [name] Other  Clinician: Referring Provider: Treating Provider/Extender: Chapman Moss in Treatment: 31 Diagnosis Coding ICD-10 Codes Code Description [phone] Non-pressure chronic ulcer of other part of left foot with other specified severity 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.621 Type 2 diabetes mellitus with foot ulcer L97.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 of skin W54.0XXA Bitten by dog, initial encounter S81.801A Unspecified open wound, right lower leg, initial encounter E11.622 Type 2 diabetes mellitus with other skin ulcer E11.42 Type 2 diabetes mellitus with diabetic polyneuropathy Z79.4 Long term (current) use of insulin Facility Procedures : CPT4 Code: 46962952 Description: 11042 - DEB SUBQ TISSUE 20 SQ CM/< ICD-10 Diagnosis Description 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.621 Type 2 diabetes mellitus with foot ulcer Modifier: Quantity: 1 : CPT4 Code: 84132440 Description: 10272 - APPLY TOTAL CONTACT LEG CAST ICD-10 Diagnosis Description 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i E11.621 Type 2 diabetes mellitus with foot ulcer Modifier: ty Quantity: 1 : CPT4 Code: 53664403 Description: 97597 - DEBRIDE WOUND 1ST 20 SQ CM OR < ICD-10 Diagnosis Description S81.801A Unspecified open wound, right lower leg, initial encounter W54.0XXA Bitten by dog, initial encounter Modifier: Quantity: 1 Physician Procedures AUTYM, SIESS [name] ([id number]): CPT4 Code Description [id number] 99213 - WC PHYS LEVEL 3 - EST PT ICD-10 Diagnosis Description W54.0XXA Bitten by dog, initial encounter S81.801A Unspecified open wound, right lower leg, initial encounter E11.622 Type 2  diabetes mellitus with other skin ulcer [id number] Page 25 of 25: Quantity Modifier 25  1 Castille, Auda [name] ([id number]): [id number] 11042 - WC PHYS SUBQ TISS 20 SQ CM ICD-10 Diagnosis Description L97.512 Non-pressure chronic ulcer of other part of right foot with fat l E11.621 Type 2 diabetes mellitus with foot ulcer [id number] Page 25 of 25: 1 ayer exposed JAMEEKA, MARCY [name] ([id number]): [id number] [phone] - WC PHYS APPLY TOTAL CONTACT CAST ICD-10 Diagnosis Description L97.528 Non-pressure chronic ulcer of other part of left foot with other E11.621 Type 2 diabetes mellitus with foot ulcer [id number] Page 25 of 25: 1 specified severity SHANTIKA, BERMEA [name] ([id number]): [id number] 97597 - WC PHYS DEBR WO ANESTH 20 SQ CM ICD-10 Diagnosis Description S81.801A Unspecified open wound, right lower leg, initial encounter W54.0XXA Bitten by dog, initial encounter [id number] Page 25 of 25: 1 Electronic Signature(s) Signed: 05/17/2023 2:12:08 PM By: Geralyn Corwin DO Entered By: Geralyn Corwin on 05/17/2023 12:59:31

## 2023-05-17 NOTE — Progress Notes (Signed)
Heather Heather, Heather Heather ([id number]) [id number] Page 1 of 10 Visit Report for 05/17/2023 Arrival Information Details Patient Name: Date of Service: PEG, FIFER 05/17/2023 11:00 Heather Heather Medical Record Number: [id number] Patient Account Number: [id number] Date of Birth/Sex: Treating RN: October 15, 1976 (47 y.o. Heather Heather Primary Care Othal Kubitz: [name] Other Clinician: Referring Mikaelyn Arthurs: Treating Ellanora Rayborn/Extender: Chapman Moss in Treatment: 43 Visit Information History Since Last Visit All ordered tests and consults were completed: No Patient Arrived: Wheel Chair Added or deleted any medications: No Arrival Time: 11:11 Hospitalized since last visit: No Accompanied By: mother Has Dressing in Place as Prescribed: Yes Transfer Assistance: None Has Compression in Place as Prescribed: Yes Patient Identification Verified: Yes Pain Present Now: No Secondary Verification Process Completed: Yes Patient Requires Transmission-Based Precautions: No Patient Has Alerts: No Electronic Signature(s) Signed: 05/17/2023 4:46:32 PM By: Bonnell Public Entered By: Bonnell Public on 05/17/2023 11:12:29 -------------------------------------------------------------------------------- Encounter Discharge Information Details Patient Name: Date of Service: Heather Jefferson Heather. 05/17/2023 11:00 Heather Heather Medical Record Number: [id number] Patient Account Number: [id number] Date of Birth/Sex: Treating RN: 05-Jun-1976 (47 y.o. Heather Heather Primary Care Miran Kautzman: [name] Other Clinician: Referring Dior Dominik: Treating Deandrea Rion/Extender: Chapman Moss in Treatment: 39 Encounter Discharge Information Items Post Procedure Vitals Discharge Condition: Stable Temperature (F): 98.5 Ambulatory Status: Wheelchair Pulse (bpm): 60 Discharge Destination: Home Respiratory Rate (breaths/min): 18 Transportation: Private Auto Blood Pressure  (mmHg): 120/54 Accompanied By: mother Schedule Follow-up Appointment: Yes Clinical Summary of Care: Electronic Signature(s) Signed: 05/17/2023 4:46:32 PM By: Garen Grams, Kelbi Heather ([id number]) [id number] Page 2 of 10 Entered By: Bonnell Public on 05/17/2023 12:18:51 -------------------------------------------------------------------------------- Lower Extremity Assessment Details Patient Name: Date of Service: Heather, Heather 05/17/2023 11:00 Heather Heather Medical Record Number: [id number] Patient Account Number: [id number] Date of Birth/Sex: Treating RN: 1976-10-06 (47 y.o. Heather Heather Primary Care Ilyse Tremain: [name] Other Clinician: Referring Sinclair Arrazola: Treating Noal Abshier/Extender: [name] Weeks in Treatment: 71 Edema Assessment Assessed: [Left: No] [Right: No] Edema: [Left: No] [Right: No] Vascular Assessment Pulses: Dorsalis Pedis Palpable: [Left:Yes] [Right:Yes] Electronic Signature(s) Signed: 05/17/2023 4:46:32 PM By: Bonnell Public Entered By: Bonnell Public on 05/17/2023 11:35:02 -------------------------------------------------------------------------------- Multi Wound Chart Details Patient Name: Date of Service: Heather Jefferson Heather. 05/17/2023 11:00 Heather Heather Medical Record Number: [id number] Patient Account Number: [id number] Date of Birth/Sex: Treating RN: 26-Apr-1976 (47 y.o. Heather Heather Primary Care Huriel Matt: [name] Other Clinician: Referring Jusitn Salsgiver: Treating Shannyn Jankowiak/Extender: Chapman Moss in Treatment: 70 Vital Signs Height(in): 69 Pulse(bpm): 60 Weight(lbs): 178 Blood Pressure(mmHg): 120/54 Body Mass Index(BMI): 26.3 Temperature(F): 98.5 Respiratory Rate(breaths/min): 18 [11:Photos:] [[id number] Page 3 of 10] Right, Plantar Foot Left, Medial, Plantar Foot Right, Anterior Lower Leg Wound Location: Surgical Injury Pressure Injury  Trauma Wounding Event: Open Surgical Wound Diabetic Wound/Ulcer of the Lower T be determined o Primary Etiology: Extremity Chronic sinus problems/congestion, Chronic sinus problems/congestion, Chronic sinus problems/congestion, Comorbid History: Middle ear problems, Anemia, Chronic Middle ear problems, Anemia, Chronic Middle ear problems, Anemia, Chronic Obstructive Pulmonary Disease Obstructive Pulmonary Disease Obstructive Pulmonary Disease (COPD), Congestive Heart Failure, (COPD), Congestive Heart Failure, (COPD), Congestive Heart Failure, Type II Diabetes, End Stage Renal Type II Diabetes, End Stage Renal Type II Diabetes, End Stage Renal Disease, History of pressure wounds, Disease, History of pressure wounds, Disease, History of pressure wounds, Neuropathy Neuropathy Neuropathy 12/01/2021 03/16/2020 05/14/2023 Date Acquired: 61 61 0 Weeks of Treatment: Open Open Open Wound Status: No No No Wound Recurrence: 0.4x0.4x0.8 0.3x0.3x0.4 1.9x1x0.2  Measurements L x W x D (cm) 0.126 0.071 1.492 Heather (cm) : rea 0.101 0.028 0.298 Volume (cm) : 93.20% 98.50% N/Heather % Reduction in Area: 93.90% 97.00% N/Heather % Reduction in Volume: Full Thickness Without Exposed Grade 3 Full Thickness Without Exposed Classification: Support Structures Support Structures Small Medium Small Exudate Amount: Serous Serosanguineous Serosanguineous Exudate Type: amber red, brown red, brown Exudate Color: Distinct, outline attached Flat and Intact Distinct, outline attached Wound Margin: Small (1-33%) Large (67-100%) None Present (0%) Granulation Amount: Pink, Pale Pink, Pale N/Heather Granulation Quality: Small (1-33%) None Present (0%) Large (67-100%) Necrotic Amount: Fat Layer (Subcutaneous Tissue): Yes Fat Layer (Subcutaneous Tissue): Yes Fat Layer (Subcutaneous Tissue): Yes Exposed Structures: Fascia: No Fascia: No Fascia: No Tendon: No Tendon: No Tendon: No Muscle: No Muscle: No Muscle: No Joint:  No Joint: No Joint: No Bone: No Bone: No Bone: No Medium (34-66%) None None Epithelialization: Treatment Notes Electronic Signature(s) Signed: 05/17/2023 4:46:32 PM By: Bonnell Public Entered By: Bonnell Public on 05/17/2023 11:35:08 -------------------------------------------------------------------------------- Multi-Disciplinary Care Plan Details Patient Name: Date of Service: Heather Jefferson Heather. 05/17/2023 11:00 Heather Heather Medical Record Number: [id number] Patient Account Number: [id number] Date of Birth/Sex: Treating RN: 1976-06-15 (47 y.o. Heather Heather Primary Care Siddh Vandeventer: [name] Other Clinician: Referring Lachae Hohler: Treating Mcihael Hinderman/Extender: Chapman Moss in Treatment: 5 Active Inactive Abuse / Safety / Falls / Self Care Management FAIGA, STONES Heather ([id number]) [id number] Page 4 of 10 Nursing Diagnoses: History of Falls Potential for falls Potential for injury related to falls Goals: Patient will not develop complications from immobility Date Initiated: 03/17/2022 Date Inactivated: 04/20/2022 Target Resolution Date: 03/16/2022 Goal Status: Met Patient/caregiver will verbalize understanding of skin care regimen Date Initiated: 03/17/2022 Date Inactivated: 09/28/2022 Target Resolution Date: 03/16/2022 Goal Status: Met Patient/caregiver will verbalize/demonstrate measure taken to improve self care Date Initiated: 03/17/2022 Target Resolution Date: 05/24/2023 Goal Status: Active Interventions: Assess fall risk on admission and as needed Provide education on basic hygiene Provide education on personal and home safety Notes: Electronic Signature(s) Signed: 05/17/2023 4:46:32 PM By: Bonnell Public Entered By: Bonnell Public on 05/17/2023 12:13:30 -------------------------------------------------------------------------------- Pain Assessment Details Patient Name: Date of Service: Heather Heather, Heather Heather 05/17/2023 11:00 Heather  [name] Medical Record Number: [id number] Patient Account Number: [id number] Date of Birth/Sex: Treating RN: 1976-04-25 (47 y.o. Heather Heather Primary Care Nabiha Planck: [name] Other Clinician: Referring Claudine Stallings: Treating Elnore Cosens/Extender: Chapman Moss in Treatment: 35 Active Problems Location of Pain Severity and Description of Pain Patient Has Paino No Site Locations Pain Management and Medication Current Pain Management: MANDA, HOLSTAD Heather ([id number]) [id number] Page 5 of 10 Electronic Signature(s) Signed: 05/17/2023 4:46:32 PM By: Bonnell Public Entered By: Bonnell Public on 05/17/2023 11:15:52 -------------------------------------------------------------------------------- Patient/Caregiver Education Details Patient Name: Date of Service: [name] 7/24/2024andnbsp11:00 Heather Heather Medical Record Number: [id number] Patient Account Number: [id number] Date of Birth/Gender: Treating RN: [birth year] (47 y.o. Heather Heather Primary Care Physician: [name] Other Clinician: Referring Physician: Treating Physician/Extender: Chapman Moss in Treatment: 59 Education Assessment Education Provided To: Patient Education Topics Provided Offloading: Handouts: How Offloading Helps Foot Wounds Heal Methods: Explain/Verbal Responses: State content correctly Electronic Signature(s) Signed: 05/17/2023 4:46:32 PM By: Bonnell Public Entered By: Bonnell Public on 05/17/2023 12:13:46 -------------------------------------------------------------------------------- Wound Assessment Details Patient Name: Date of Service: CARDELIA, SASSANO 05/17/2023 11:00 Heather Heather Medical Record Number: [id number] Patient Account Number: [id number] Date of Birth/Sex: Treating RN: [birth year] (47 y.o. Heather Heather Primary Care Jonan Seufert: [name]  Other Clinician: Referring Zafir Schauer: Treating Chandni Gagan/Extender: Chapman Moss in Treatment: 30 Wound Status Wound Number: 11 Primary Open Surgical Wound Etiology: Wound Location: Right, Plantar Foot Wound Open Wounding Event: Surgical Injury Status: Date Acquired: 12/01/2021 MAEVE, DEBORD Heather ([id number]) [id number] Page 6 of 10 Date Acquired: 12/01/2021 Comorbid Chronic sinus problems/congestion, Middle ear problems, Anemia, Weeks Of Treatment: 61 History: Chronic Obstructive Pulmonary Disease (COPD), Congestive Heart Clustered Wound: No Failure, Type II Diabetes, End Stage Renal Disease, History of pressure wounds, Neuropathy Photos Wound Measurements Length: (cm) 0.4 Width: (cm) 0.4 Depth: (cm) 0.8 Area: (cm) 0.126 Volume: (cm) 0.101 % Reduction in Area: 93.2% % Reduction in Volume: 93.9% Epithelialization: Medium (34-66%) Wound Description Classification: Full Thickness Without Exposed Suppo Wound Margin: Distinct, outline attached Exudate Amount: Small Exudate Type: Serous Exudate Color: amber rt Structures Foul Odor After Cleansing: No Slough/Fibrino Yes Wound Bed Granulation Amount: Small (1-33%) Exposed Structure Granulation Quality: Pink, Pale Fascia Exposed: No Necrotic Amount: Small (1-33%) Fat Layer (Subcutaneous Tissue) Exposed: Yes Necrotic Quality: Adherent Slough Tendon Exposed: No Muscle Exposed: No Joint Exposed: No Bone Exposed: No Treatment Notes Wound #11 (Foot) Wound Laterality: Plantar, Right Cleanser Soap and Water Discharge Instruction: Gently cleanse wound with antibacterial soap, rinse and pat dry prior to dressing wounds Wound Cleanser Discharge Instruction: Wash your hands with soap and water. Remove old dressing, discard into plastic bag and place into trash. Cleanse the wound with Wound Cleanser prior to applying Heather clean dressing using gauze sponges, not tissues or cotton balls. Do not scrub or use excessive force. Pat dry using gauze sponges, not tissue or  cotton balls. Heather-Wound Care Topical Primary Dressing Aquacel Extra Hydrofiber Dressing, 2x2 (in/in) Secondary Dressing ABD Pad 5x9 (in/in) Discharge Instruction: Cover with ABD pad Secured With Medipore T - 68M Medipore H Soft Cloth Surgical T ape ape, 2x2 (in/yd) Kerlix Roll Sterile or Non-Sterile 6-ply 4.5x4 (yd/yd) Discharge Instruction: Apply Kerlix as directed Compression Wrap Compression Stockings Add-Ons NIOMI, VALENT Heather ([id number]) [id number] Page 7 of 10 Electronic Signature(s) Signed: 05/17/2023 4:46:32 PM By: Bonnell Public Entered By: Bonnell Public on 05/17/2023 11:32:55 -------------------------------------------------------------------------------- Wound Assessment Details Patient Name: Date of Service: Heather Heather, Heather Heather 05/17/2023 11:00 Heather Heather Medical Record Number: [id number] Patient Account Number: [id number] Date of Birth/Sex: Treating RN: 29-Dec-1975 (47 y.o. Heather Heather Primary Care Neka Bise: [name] Other Clinician: Referring Tyeson Tanimoto: Treating Lundyn Coste/Extender: [name] Weeks in Treatment: 38 Wound Status Wound Number: 12 Primary Diabetic Wound/Ulcer of the Lower Extremity Etiology: Wound Location: Left, Medial, Plantar Foot Wound Open Wounding Event: Pressure Injury Status: Date Acquired: 03/16/2020 Comorbid Chronic sinus problems/congestion, Middle ear problems, Anemia, Weeks Of Treatment: 61 History: Chronic Obstructive Pulmonary Disease (COPD), Congestive Heart Clustered Wound: No Failure, Type II Diabetes, End Stage Renal Disease, History of pressure wounds, Neuropathy Photos Wound Measurements Length: (cm) 0.3 Width: (cm) 0.3 Depth: (cm) 0.4 Area: (cm) 0.071 Volume: (cm) 0.028 % Reduction in Area: 98.5% % Reduction in Volume: 97% Epithelialization: None Wound Description Classification: Grade 3 Wound Margin: Flat and Intact Exudate Amount: Medium Exudate Type:  Serosanguineous Exudate Color: red, brown Foul Odor After Cleansing: No Slough/Fibrino No Wound Bed Granulation Amount: Large (67-100%) Exposed Structure Granulation Quality: Pink, Pale Fascia Exposed: No Necrotic Amount: None Present (0%) Fat Layer (Subcutaneous Tissue) Exposed: Yes Tendon Exposed: No Muscle Exposed: No Joint Exposed: No Bone Exposed: No Mentzel, Shateka Heather ([id number]) [id number] Page 8 of 10 Treatment Notes Wound #12 (Foot) Wound Laterality: Plantar, Left, Medial Cleanser Soap  and Water Discharge Instruction: Gently cleanse wound with antibacterial soap, rinse and pat dry prior to dressing wounds Wound Cleanser Discharge Instruction: Wash your hands with soap and water. Remove old dressing, discard into plastic bag and place into trash. Cleanse the wound with Wound Cleanser prior to applying Heather clean dressing using gauze sponges, not tissues or cotton balls. Do not scrub or use excessive force. Pat dry using gauze sponges, not tissue or cotton balls. Heather-Wound Care Topical Primary Dressing Hydrofera Blue Ready Transfer Foam, 2.5x2.5 (in/in) Discharge Instruction: Apply Hydrofera Blue Ready to wound bed as directed Secondary Dressing Gauze Discharge Instruction: As directed: dry heel cup Secured With Medipore T - 44M Medipore H Soft Cloth Surgical T ape ape, 2x2 (in/yd) Kerlix Roll Sterile or Non-Sterile 6-ply 4.5x4 (yd/yd) Discharge Instruction: Apply Kerlix as directed Compression Wrap Compression Stockings Add-Ons Electronic Signature(s) Signed: 05/17/2023 4:46:32 PM By: Bonnell Public Entered By: Bonnell Public on 05/17/2023 11:33:17 -------------------------------------------------------------------------------- Wound Assessment Details Patient Name: Date of Service: Heather Heather, Heather Heather 05/17/2023 11:00 Heather Heather Medical Record Number: [id number] Patient Account Number: [id number] Date of Birth/Sex: Treating RN: Mar 18, 1976 (47 y.o. Heather Heather Primary Care Catricia Scheerer: [name] Other Clinician: Referring Marlyss Cissell: Treating Bilan Tedesco/Extender: Chapman Moss in Treatment: 32 Wound Status Wound Number: 14 Primary T be determined o Etiology: Wound Location: Right, Anterior Lower Leg Wound Open Wounding Event: Trauma Status: Date Acquired: 05/14/2023 Comorbid Chronic sinus problems/congestion, Middle ear problems, Anemia, Weeks Of Treatment: 0 History: Chronic Obstructive Pulmonary Disease (COPD), Congestive Heart Clustered Wound: No Failure, Type II Diabetes, End Stage Renal Disease, History of pressure wounds, Neuropathy Photos Heather Heather, Heather Heather Heather ([id number]) [id number] Page 9 of 10 Wound Measurements Length: (cm) 1.9 Width: (cm) 1 Depth: (cm) 0.2 Area: (cm) 1.492 Volume: (cm) 0.298 % Reduction in Area: % Reduction in Volume: Epithelialization: None Tunneling: No Undermining: No Wound Description Classification: Full Thickness Without Exposed Suppo Wound Margin: Distinct, outline attached Exudate Amount: Small Exudate Type: Serosanguineous Exudate Color: red, brown rt Structures Foul Odor After Cleansing: No Slough/Fibrino Yes Wound Bed Granulation Amount: None Present (0%) Exposed Structure Necrotic Amount: Large (67-100%) Fascia Exposed: No Necrotic Quality: Adherent Slough Fat Layer (Subcutaneous Tissue) Exposed: Yes Tendon Exposed: No Muscle Exposed: No Joint Exposed: No Bone Exposed: No Treatment Notes Wound #14 (Lower Leg) Wound Laterality: Right, Anterior Cleanser Normal Saline Discharge Instruction: Wash your hands with soap and water. Remove old dressing, discard into plastic bag and place into trash. Cleanse the wound with Normal Saline prior to applying Heather clean dressing using gauze sponges, not tissues or cotton balls. Do not scrub or use excessive force. Pat dry using gauze sponges, not tissue or cotton balls. Heather-Wound  Care Topical Mupirocin Ointment Discharge Instruction: Apply as directed by Yeraldi Fidler. Primary Dressing Secondary Dressing (BORDER) Zetuvit Plus SILICONE BORDER Dressing 4x4 (in/in) Discharge Instruction: Please do not put silicone bordered dressings under wraps. Use non-bordered dressing only. Secured With Compression Wrap Compression Stockings Add-Ons Electronic Signature(s) Signed: 05/17/2023 4:46:32 PM By: Bonnell Public Entered By: Bonnell Public on 05/17/2023 11:33:44 Heather Heather ([id number]) [id number] Page 10 of 10 -------------------------------------------------------------------------------- Vitals Details Patient Name: Date of Service: Heather Heather, Heather Heather 05/17/2023 11:00 Heather Heather Medical Record Number: [id number] Patient Account Number: [id number] Date of Birth/Sex: Treating RN: February 03, 1976 (47 y.o. Heather Heather Primary Care Shawnta Schlegel: [name] Other Clinician: Referring Eaton Folmar: Treating Glendel Jaggers/Extender: Chapman Moss in Treatment: 69 Vital Signs Time Taken: 11:14 Temperature (F): 98.5 Height (in): 69 Pulse (bpm): 60  Weight (lbs): 178 Respiratory Rate (breaths/min): 18 Body Mass Index (BMI): 26.3 Blood Pressure (mmHg): 120/54 Reference Range: 80 - 120 mg / dl Electronic Signature(s) Signed: 05/17/2023 4:46:32 PM By: Bonnell Public Entered By: Bonnell Public on 05/17/2023 11:15:18

## 2023-05-24 ENCOUNTER — Encounter (HOSPITAL_BASED_OUTPATIENT_CLINIC_OR_DEPARTMENT_OTHER): Payer: MEDICAID | Admitting: Internal Medicine

## 2023-05-24 DIAGNOSIS — E11622 Type 2 diabetes mellitus with other skin ulcer: Secondary | ICD-10-CM | POA: Diagnosis not present

## 2023-05-24 DIAGNOSIS — L97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 of skin: Secondary | ICD-10-CM | POA: Diagnosis not present

## 2023-05-24 DIAGNOSIS — E11621 Type 2 diabetes mellitus with foot ulcer: Secondary | ICD-10-CM

## 2023-05-24 DIAGNOSIS — L97528 Non-pressure chronic ulcer of other part of left foot with other specified severity: Secondary | ICD-10-CM

## 2023-05-24 DIAGNOSIS — W540XXA Bitten by dog, initial encounter: Secondary | ICD-10-CM | POA: Diagnosis not present

## 2023-05-24 DIAGNOSIS — L97512 Non-pressure chronic ulcer of other part of right foot with fat layer exposed: Secondary | ICD-10-CM | POA: Diagnosis not present

## 2023-05-26 NOTE — Progress Notes (Signed)
TICIA, VIRGO A ([id number]) [id number] Page 1 of 25 Visit Report for 05/24/2023 Chief Complaint Document Details Patient Name: Date of Service: [name], [name] 05/24/2023 11:00 A M Medical Record Number: [id number] Patient Account Number: [id number] Date of Birth/Sex: Treating RN: 28-Jan-1976 (47 y.o. Skip Mayer Primary Care Provider: [name] Other Clinician: [name] Referring Provider: Treating Provider/Extender: Chapman Moss in Treatment: 54 Information Obtained from: Patient Chief Complaint 03/19/2021; patient referred by Dr. Excell Seltzer who has been looking after her left foot for quite a period of time for review of a nonhealing area in the left midfoot 03/12/2022; bilateral feet wounds and right lower extremity wound. Electronic Signature(s) Signed: 05/24/2023 4:29:16 PM By: [name] Corwin DO Entered By: [name] [name] on 05/24/2023 12:34:45 -------------------------------------------------------------------------------- Debridement Details Patient Name: Date of Service: [name] A. 05/24/2023 11:00 A M Medical Record Number: [id number] Patient Account Number: [id number] Date of Birth/Sex: Treating RN: 1976/03/27 (47 y.o. Cathlean Cower, [name] Primary Care Provider: [name] Other Clinician: [name] Referring Provider: Treating Provider/Extender: Chapman Moss in Treatment: 62 Debridement Performed for Assessment: Wound #12 Left,Medial,Plantar Foot Performed By: Physician [name] Corwin, MD Debridement Type: Debridement Severity of Tissue Pre Debridement: Fat layer exposed Level of Consciousness (Pre-procedure): Awake and Alert Pre-procedure Verification/Time Out Yes - 11:49 Taken: Start Time: 11:49 Percent of Wound Bed Debrided: 100% T Area Debrided (cm): otal 0.09 Tissue and other material debrided: Viable, Non-Viable, Callus, Slough, Slough Level: Non-Viable  Tissue Debridement Description: Selective/Open Wound Instrument: Curette Bleeding: Minimum Hemostasis Achieved: Pressure Response to Treatment: Procedure was tolerated well Level of Consciousness (Post- Awake and Alert JONIQUA, SIDLE A ([id number]) [id number] Page 2 of 25 Awake and Alert procedure): Post Debridement Measurements of Total Wound Length: (cm) 0.4 Width: (cm) 0.3 Depth: (cm) 0.2 Volume: (cm) 0.019 Character of Wound/Ulcer Post Debridement: Stable Severity of Tissue Post Debridement: Fat layer exposed Post Procedure Diagnosis Same as Pre-procedure Electronic Signature(s) Signed: 05/24/2023 4:29:16 PM By: [name] Corwin DO Signed: 05/24/2023 4:54:16 PM By: [name] Signed: 05/26/2023 11:12:15 AM By: Elliot Gurney, BSN, RN, CWS, [name] RN, BSN Entered By: [name] on 05/24/2023 11:50:32 -------------------------------------------------------------------------------- Debridement Details Patient Name: Date of Service: [name] A. 05/24/2023 11:00 A M Medical Record Number: [id number] Patient Account Number: [id number] Date of Birth/Sex: Treating RN: 1976-04-12 (47 y.o. Cathlean Cower, [name] Primary Care Provider: [name] Other Clinician: [name] Referring Provider: Treating Provider/Extender: Chapman Moss in Treatment: 62 Debridement Performed for Assessment: Wound #11 Right,Plantar Foot Performed By: Physician [name] Corwin, MD Debridement Type: Debridement Level of Consciousness (Pre-procedure): Awake and Alert Pre-procedure Verification/Time Out Yes - 11:54 Taken: Start Time: 11:54 Percent of Wound Bed Debrided: 100% T Area Debrided (cm): otal 0.13 Tissue and other material debrided: Viable, Non-Viable, Slough, Slough Level: Non-Viable Tissue Debridement Description: Selective/Open Wound Instrument: Curette Bleeding: Minimum Hemostasis Achieved: Pressure Response to Treatment: Procedure  was tolerated well Level of Consciousness (Post- Awake and Alert procedure): Post Debridement Measurements of Total Wound Length: (cm) 0.4 Width: (cm) 0.4 Depth: (cm) 0.5 Volume: (cm) 0.063 Character of Wound/Ulcer Post Debridement: Stable Post Procedure Diagnosis Same as Pre-procedure Electronic Signature(s) Signed: 05/24/2023 4:29:16 PM By: [name] Corwin DO Signed: 05/24/2023 4:54:16 PM By: [name] Signed: 05/26/2023 11:12:15 AM By: Elliot Gurney, BSN, RN, CWS, [name] RN, BSN [place], [name] A ([id number]) AM By: Elliot Gurney, BSN, RN, CWS, [name] RN, BSN [phone].pdf Page 3 of 25 Signed: 05/26/2023 11:12:15 Entered By: [name] on 05/24/2023 11:55:20 -------------------------------------------------------------------------------- Debridement Details Patient  Name: Date of Service: [name], [name] 05/24/2023 11:00 A M Medical Record Number: [id number] Patient Account Number: [id number] Date of Birth/Sex: Treating RN: [birth year] (47 y.o. Cathlean Cower, [name] Primary Care Provider: [name] Other Clinician: [name] Referring Provider: Treating Provider/Extender: Chapman Moss in Treatment: 62 Debridement Performed for Assessment: Wound #14 Right,Anterior Lower Leg Performed By: Physician [name] Corwin, MD Debridement Type: Debridement Level of Consciousness (Pre-procedure): Awake and Alert Pre-procedure Verification/Time Out Yes - 11:55 Taken: Start Time: 11:55 Percent of Wound Bed Debrided: 100% T Area Debrided (cm): otal 0.82 Tissue and other material debrided: Viable, Non-Viable, Slough, Slough Level: Non-Viable Tissue Debridement Description: Selective/Open Wound Instrument: Curette Bleeding: Minimum Hemostasis Achieved: Pressure Response to Treatment: Procedure was tolerated well Level of Consciousness (Post- Awake and Alert procedure): Post Debridement Measurements of Total Wound Length: (cm) 1.5 Width: (cm)  0.7 Depth: (cm) 0.1 Volume: (cm) 0.082 Character of Wound/Ulcer Post Debridement: Stable Post Procedure Diagnosis Same as Pre-procedure Electronic Signature(s) Signed: 05/24/2023 4:29:16 PM By: [name] Corwin DO Signed: 05/24/2023 4:54:16 PM By: [name] Signed: 05/26/2023 11:12:15 AM By: Elliot Gurney, BSN, RN, CWS, [name] RN, BSN Entered By: [name] on 05/24/2023 11:56:24 HPI Details -------------------------------------------------------------------------------- [name] ([id number]) [id number] Page 4 of 25 Patient Name: Date of Service: KENSY, [name] 05/24/2023 11:00 A M Medical Record Number: [id number] Patient Account Number: [id number] Date of Birth/Sex: Treating RN: Jul 17, 1976 (47 y.o. Skip Mayer Primary Care Provider: [name] Other Clinician: [name] Referring Provider: Treating Provider/Extender: Chapman Moss in Treatment: 67 History of Present Illness HPI Description: 01/18/18-She is here for initial evaluation of the left great toe ulcer. She is a poor historian in regards to timeframe in detail. She states approximately 4 weeks ago she lacerated her toe on something in the house. She followed up with her primary care who placed her on Bactrim and ultimately a second dose of Bactrim prior to coming to wound clinic. She states she has been treating the toe with peroxide, Betadine and a Band-Aid. She did not check her blood sugar this morning but checked it yesterday morning it was 327; she is unaware of a recent A1c and there are no current records. She saw Dr. she would've orthopedics last week for an old injury to the left ankle, she states he did not see her toe, nor did she bring it to his attention. She smokes approximately 1 pack cigarettes a day. Her social situation is concerning, she arrives this morning with her mother who appears extremely intoxicated/under the influence; her mother  was asked to leave the room and be monitored by the patient's grandmother. The patient's aunt then accompanied the patient and the room throughout the rest of the appointment. We had a lengthy discussion regarding the deleterious effects of uncontrolled hyperglycemia and smoking as it relates to wound healing and overall health. She was strongly encouraged to decrease her smoking and get her diabetes under better control. She states she is currently on a diet and has cut down her [facility] consumption. The left toe is erythematous, macerated and slightly edematous with malodor present. The edema in her left foot is below her baseline, there is no erythema streaking. We will treat her with Santyl, doxycycline; we have ordered and xray, culture and provided a Peg assist surgical shoe and cultured the wound. 01/25/18-She is here in follow-up evaluation for a left great toe ulcer and presents with an abscess to her suprapubic area. She states  her blood sugars remain elevated, feeling "sick" and if levels are below 250, but she is trying. She has made no attempt to decrease her smoking stating that we "can't take away her food in her cigarettes". She has been compliant with offloading using the PEG assist you. She is using Santyl daily. the culture obtained last week grew staph aureus and Enterococcus faecalis; continues on the doxycycline and Augmentin was added on Monday. The suprapubic area has erythema, no femoral variation, purple discoloration, minimal induration, was accessed with a cotton tip applicator with sanguinopurulent drainage, this was cultured, I suspect the current antibiotic treatment will cover and we will not add anything to her current treatment plan. She was advised to go to urgent care or ER with any change in redness, induration or fever. 02/01/18-She is here in follow-up evaluation for left great toe ulcers and a new abdominal abscess from last week. She was able to use packing  until earlier this week, where she "forgot it was there". She states she was feeling ill with GI symptoms last week and was not taking her antibiotic. She states her glucose levels have been predominantly less than 200, with occasional levels between 200-250. She thinks this was contributing to her GI symptoms as they have resolved without intervention. There continues to be significant laceration to left toe, otherwise it clinically looks stable/improved. There is now less superficial opening to the lateral aspect of the great toe that was residual blister. We will transition to [facility] to all wounds, she will continue her Augmentin. If there is no change or deterioration next week for reculture. 02/08/18-She is here in follow-up evaluation for left great toe ulcer and abdominal ulcer. There is an improvement in both wounds. She has been wrapping her left toe with coban, not by our direction, which has created an area of discoloration to the medial aspect; she has been advised to NOT use coban secondary to her neuropathy. She states her glucose levels have been high over this last week ranging from 200-350, she continues to smoke. She admits to being less compliant with her offloading shoe. We will continue with same treatment plan and she will follow-up next week. 02/15/18-She is here in follow-up evaluation for left great toe ulcer and abdominal ulcer. The abdominal ulcer is epithelialized. The left great toe ulcer is improved and all injury from last week using the Coban wrap is resolved, the lateral ulcer is healed. She admits to noncompliance with wearing offloading shoe and admits to glucose levels being greater than 300 most of the week. She continues to smoke and expresses no desire to quit. There is one area medially that probes deeper than it has historically, erythema to the toe and dorsal foot has consistently waxed and waned. There is no overt signs of cellulitis or infection but we  will culture the wound for any occult infection given the new area of depth and erythema. We will hold off on sensitivities for initiation of antibiotic therapy. 02/22/18-She is here in follow up evaluation for left great toe ulcer. There is overall significant improvement in both wound appearance, erythema and edema with changes made last week. She was not initiated on antibiotic therapy. Culture obtained last week showed oxacillin sensitive staph aureus, sensitive to clindamycin. Clindamycin has been called into the pharmacy but she has been instructed to hold off on initiation secondary to overall clinical improvement and her history of antibiotic intolerance. She has been instructed to contact the clinic with any noted  changes/deterioration and the wound, erythema, edema and/or pain. She will follow-up next week. She continues to smoke and her glucose levels remain elevated >250; she admits to compliance with offloading shoe 03/01/18 on evaluation today patient appears to be doing fairly well in regard to her left first toe ulcer. She has been tolerating the dressing changes with the [facility] Dressing without complication and overall this has definitely showed signs of improvement according to records as well is what the patient tells me today. I'm very pleased in that regard. She is having no pain today 03/08/18 She is here for follow up evaluation of a left great toe ulcer. She remains non-compliant with glucose control and smoking cessation; glucose levels consistently >200. She states that she got new shoe inserts/peg assist. She admits to compliance with offloading. Since my last evaluation there is significant improvement. We will switch to prisma at this time and she will follow up next week. She is noted to be tachycardic at this appointment, heart rate 120s; she has a history of heart rate 70-130 according to our records. She admits to extreme agitation r/t personal issues; she was advised  to monitor her heartrate and contact her physician if it does not return to a more normal range (<100). She takes cardizem twice daily. 03/15/18-She is here in follow-up evaluation for left great toe ulcer. She remains noncompliant with glucose control and smoking cessation. She admits to compliance with wearing offloading shoe. The ulcer is improved/stable and we will continue with the same treatment plan and she will follow-up next week 03/22/18-She is here for evaluation for left great toe ulcer. There continues to be significant improvement despite recurrent hyperglycemia (over 500 yesterday) and she continues to smoke. She has been compliant with offloading and we will continue with same treatment plan and she will follow-up next week. 03/29/18-She is here for evaluation for left great toe ulcer. Despite continuing to smoke and uncontrolled diabetes she continues to improve. She is compliant with offloading shoe. We will continue with the same treatment plan and she will follow-up next week 04/05/18- She is here in follow up evaluation for a left great toe ulcer; she presents with small pustule to left fifth toe (resembles ant bite). She admits to compliance with wearing offloading shoe; continues to smoke or have uncontrolled blood glucose control. There is more callus than usual with evidence of bleeding; she denies known trauma. 04/12/18-She is here for evaluation of left great toe ulcer. Despite noncompliance with glycemic control and smoking she continues to make improvement. She continues to wear offloading shoe. The pustule, that was identified last week, to the left fifth toe is resolved. She will follow-up in 2 weeks 05/03/18-she is seen in follow-up evaluation for a left great toe ulcer. She is compliant with offloading, otherwise noncompliant with glycemic control and smoking. She has plateaued and there is minimal improvement noted. We will transition to [facility], replaced the insert to  her surgical shoe and she will follow-up in one week 05/10/18- She is here in follow up evaluation for a left great toe ulcer. It appears stable despite measurement change. We will continue with same treatment plan and follow up next week. 05/24/18-She is seen in follow-up evaluation for a left great toe ulcer. She remains compliant with offloading, has made significant improvement in her diet, decreasing the amount of sugar/soda. She said her recent A1c was 10.9 which is lower than. She did see a diabetic nutritionist/educator yesterday. She continues to smoke. We  will continue with the same treatment plan and she'll follow-up next week. 05/31/18- She is seen in follow-up evaluation for left great toe ulcer. She continues to remain compliant with offloading, continues to make improvement in her diet, increasing her water and decreasing the amount of sugar/soda. She does continue to smoke with no desire to quit. We will apply Prisma to the depth and Hydrofera Blue over. We have not received insurance authorization for oasis. She will follow up next week. 06/07/18-She is seen in follow-up evaluation for left great toe ulcer. It has stalled according to today's measurements although base appears stable. She says she saw a diabetic educator yesterday; her average blood sugars are less than 300 which is an improvement for her. She continues to smoke and states "that's my next step" She continues with water over soda. We will order for xray, culture and reinstate ace wrap compression prior to placing apligraf for next week. She is voicing no complaints or concerns. Her dressing will change to iodoflex over the next week in preparation for apligraf. 06/14/18-She is seen in follow-up evaluation for left great toe ulcer. Plain film x-ray performed last week was negative for osteomyelitis. Wound culture obtained last week grew strep B and OSSA; she is initiated on keflex and cefdinir today; there is erythema to the  toe which could be from ace wrap compression, she has a history of wrapping too tight and has has been encouraged to maintain ace wraps that we place today. We will hold off on application of apligraf today, will apply next week after antibiotic therapy has been initiated. She admits today that she has resumed taking a shower with her foot/toe submerged in water, she has been reminded to keep foot/toe out of the bath water. She will be seen in follow up next week Templeman, Melvine A ([id number]) [id number] Page 5 of 25 06/21/18-she is seen in follow-up evaluation for left great toe ulcer. She is tolerating antibiotic therapy with no GI disturbance. The wound is stable. Apligraf was applied today. She has been decreasing her smoking, only had 4 cigarettes yesterday and 1 today. She continues being more compliant in diabetic diet. She will follow-up next week for evaluation of site, if stable will remove at 2 weeks. 06/28/18- She is here in follow up evalution. Apligraf was placed last week, she states the dressing fell off on Tuesday and she was dressing with hydrofera blue. She is healed and will be discharged from the clinic today. She has been instructed to continue with smoking cessation, continue monitoring glucose levels, offloading for an additional 4 weeks and continue with hydrofera blue for additional two weeks for any possible microscopic opening. Readmission: 08/07/18 on evaluation today patient presents for reevaluation concerning the ulcer of her right great toe. She was previously discharged on 06/28/18 healed. Nonetheless she states that this began to show signs of drainage she subsequently went to her primary care provider. Subsequently an x-ray was performed on 08/01/18 which was negative. The patient was also placed on antibiotics at that time. Fortunately they should have been effective for the infection. Nonetheless she's been experiencing some improvement but  still has a lot of drainage coming from the wound itself. 08/14/18 on evaluation today patient's wound actually does show signs of improvement in regard to the erythema at this point. She has completed the antibiotics. With that being said we did discuss the possibility of placing her in a total contact cast as of today although I think that I may  want to give this just a little bit more time to ensure nothing recurrence as far as her infection is concerned. I do not want to put in the cast and risk infection at that time if things are not completely resolved. With that being said she is gonna require some debridement today. 08/21/18 on evaluation today patient actually appears to be doing okay in regard to her toe ulcer. She's been tolerating the dressing changes without complication. With that being said it does appear that she is ready and in fact I think it's appropriate for [place] to go ahead and initiate the total contact cast today. Nonetheless she will require some sharp debridement to prepare the wound for application. Overall I feel like things have been progressing well but we do need to do something to get this to close more readily. 08/24/18 patient seen today for reevaluation after having had the total contact cast applied on Tuesday. She seems to have done very well the wound appears to be doing great and overall I'm pleased with the progress that she's made. There were no abnormal areas of rubbing from the cast on her lower extremity. 08/30/18 on evaluation today patient actually appears to be completely healed in regard to her plantar toe ulcer. She tells me at this point she's been having a lot of issues with the cast. She almost fell a couple of times the state shall the step of her dog a couple times as well. This is been a very frustrating process for her other nonetheless she has completely healed the wound which is excellent news. Overall there does not appear to be the evidence of  infection at this time which is great news. 09/11/18 evaluation today patient presents for follow-up concerning her great toe ulcer on the left which has unfortunately reopened since I last saw her which was only a couple of weeks ago. Unfortunately she was not able to get in to get the shoe and potentially the AFO that's gonna be necessary due to her left foot drop. She continues with offloading shoe but this is not enough to prevent her from reopening it appears. When we last had her in the total contact cast she did well from a healing standpoint but unfortunately the wound reopened as soon as she came out of the cast within just a couple of weeks. 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 This is also part of the reason why she falls on a regular basis. I just do not believe that is gonna be safe for [place] to reinitiate the total contact cast as last time we had this on she fell 3 times one week which is definitely not normal for her. 09/18/18 upon evaluation today the patient actually appears to be doing about the same in regard to her toe ulcer. She did not contact [employer] as I asked her to even though I had given her the prescription. In fact she actually states that she has no idea where the prescription is. She did apparently call [employer] and they told her that all she needed to do was bring the prescription in order to be able to be seen and work on getting the AFO for her left foot. With all that being said she still does not  have an appointment and I'm not sure were things stand that regard. I will give her a new prescription today in order to contact them to get this set up. 09/25/18 on evaluation today patient actually appears to be doing about the same in regard to her toes ulcer. She does have  a small areas which seems to have a lot of callous buildup around the edge of the wound which is going to need sharp debridement today. She still is waiting to be scheduled for evaluation with [employer] for possibility of an AFO. She states there supposed to call her tomorrow to get this set up. Unfortunately it does appear that her foot specifically the toe area is showing signs of erythema. There does not appear to be any systemic infection which is in these good news. 10/02/18 on evaluation today patient actually appears to be doing about the same in regard to her toe ulcer. This really has not done too well although it's not significantly larger it's also not significantly smaller. She has been tolerating the dressing changes without complication. She actually has her appointment with [employer] and [place] tomorrow to hopefully be measured for obtaining and AFO splint. I think this would be helpful preventing this from reoccurring. We had contemplated starting the cast this week although to be honest I am reluctant to do that as she's been having nausea, vomiting, and seizure activity over the past three days. She has a history of seizures and have been told is nothing that can be done for these. With that being said I do believe that along with the seizures have the nausea vomiting which upon further questioning doesn't seem to be the normal for her and makes me concerned for the possibility of infection or something else going on. I discussed this with the patient and her mother during the office visit today. I do not feel the wound is effective but maybe something else. The responses this was "this just happens to her at times and we don't know why". They did not seem to be interested in going to the hospital to have this checked out further. 10/09/18 on evaluation today patient presents for follow-up concerning her ongoing toe ulcer. She has been tolerating the dressing changes  without complication. Fortunately there does not appear to be any evidence of infection which is great news however I do think that the patient would benefit from going ahead for with the total contact cast. She's actually in a wheelchair today she tells me that she will use her walker if we initiate the cast. I was very specific about the fact that if we were gonna do the cast I wanted to make sure that she was using the walker in order to prevent any falls. She tells me she does not have stairs that she has to traverse on a regular basis at her home. She has not had any seizures since last week again that something that happens to her often she tells me she did talk to [employer] and they said that it may take up to three weeks to get the brace approved for her. Hopefully that will not take that long but nonetheless in the meantime I do think the cast could be of benefit. 10/12/18 on evaluation today patient appears to be doing rather well in regard to her toe ulcer. It's just been a few days and already this is significantly improved both as far as overall appearance and size. Fortunately there's no sign of infection.  She is here for her first obligatory cast change. 10/19/18 Seen today for follow up and management of left great toe ulcer. Wound continues to show improvement. Noted small open area with seroussang drainage with palpation. Denies any increased pain or recent fevers during visit. She will continue calcium alginate with offloading shoe. Denies any questions or concerns during visit. 10/26/18 on evaluation today patient appears to be doing about the same as when I last saw her in regard to her wound bed. Fortunately there does not appear to be any signs of infection. Unfortunately she continues to have a breakdown in regard to the toe region any time that she is not in the cast. It takes almost no time at all for this to happen. Nonetheless she still has not heard anything from the brace  being made by [employer] as to when exactly this will be available to her. Fortunately there is no signs of infection at this time. 10/30/18 on evaluation today patient presents for application of the total contact cast as we just received him this morning. Fortunately we are gonna be able to apply this to her today which is great news. She continues to have no significant pain which is good news. Overall I do feel like things have been improving while she was the cast is when she doesn't have a cast that things get worse. She still has not really heard anything from [employer] regarding her brace. 11/02/18 upon evaluation today patient's wound already appears to be doing significantly better which is good news. Fortunately there does not appear to be any signs of infection also good news. Overall I do think the total contact cast as before is helping to heal this area unfortunately it's just not gonna likely keep the area closed and healed without her getting her brace at least. Again the foot drop is a significant issue for her. 11/09/18 on evaluation today patient appears to be doing excellent in regard to her toe ulcer which in fact is completely healed. Fortunately we finally got the situation squared away with the paperwork which was needed to proceed with getting her brace approved by Medicaid. I have filled that out unfortunately that GIRL, SCHISSLER A ([id number]) [id number] Page 6 of 25 information has been sent to the orthopedic office that I worked at 2 1/2 years ago and not tired Current wound care measures. Fortunately she seems to be doing very well at this time. 11/23/18 on evaluation today patient appears to be doing More Poorly Compared to Last Time I Saw Her. At [facility] She Had Completely Healed. Currently she is continuing to have issues with reopening. She states that she just found out that the brace was approved through Medicaid now she just has to go  get measured in order to have this fitted for her and then made. Subsequently she does not have an appointment for this yet that is going to complicate things we obviously cannot put her back in the cast if we do not have everything measured because they're not gonna be able to measure her foot while she is in the cast. 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 Obviously this is unfortunate and does have me somewhat worried as well. 11/30/18 on evaluation today patient's toe ulcer actually appears to be doing fairly well. The good news is she will be getting her brace in the shoes  next week on Wednesday. Hopefully we will be able to get this to heal without having to go back in the cast however she may need the cast in order to get the wound completely heal and then go from there. Fortunately there's no signs of infection at this time. 12/07/18 on evaluation today patient fortunately did receive her brace and she states she could tell this definitely makes her walk better. With that being said she's been having issues with her toe where she noticed yesterday there was a lot of tissue that was loosing off this appears to be much larger than what it was previous. She also states that her leg has been read putting much across the top of her foot just about the ankle although this seems to be receiving somewhat. The total area is still red and appears to be someone infected as best I can tell. She is previously taken Bactrim and that may be a good option for her today as well. We are gonna see what I wound culture shows as well and I think that this is definitely appropriate. With that being said outside of the culture I still need to initiate something in the interim and that's what I'm gonna go ahead and select Bactrim is a good option for her. 12/14/18 on evaluation today patient appears to be doing better  in regard to her left great toe ulcer as compared to last week's evaluation. There's still some erythema although this is significantly improved which is excellent news. Overall I do believe that she is making good progress is still gonna take some time before she is where I would like her to be from the standpoint of being able to place her back into the total contact cast. Hopefully we will be where we need to be by next week. 12/21/18 on evaluation today patient actually appears to be doing poorly in regard to her toe ulcer. She's been tolerating the dressing changes without complication. Fortunately there's no signs of systemic infection although she does have a lot of drainage from the toe ulcer and this does seem to be causing some issues at this point. She does have erythema on the distal portion of her toe that appears to be likely cellulitis. 12/28/18 on evaluation today patient actually appears to be doing a little better in my pinion in regard to her toe ulcer. With that being said she still does have some evidence of infection at this time and for her culture she had both E. coli as well as enterococcus as organisms noted on evaluation. For that reason I think that though the Keflex likely has treated the E. coli rather well this has really done nothing for the enterococcus. We are going to have to initiate treatment for this specifically. 01/04/19 on evaluation today patient's toe actually appears to be doing better from the standpoint of infection. She currently would like to see about putting the cash back on I think that this is appropriate as long as she takes care of it and keeps it from getting wet. She is gonna have some drainage we can definitely pass this up with Drawtex and alginate to try to prevent as much drainage as possible from causing the problems. With that being said I do want to at least try her with the cast between now and Tuesday. If there any issues we can't continue to  use it then I will discontinue the use of the cast at that point. 01/08/19 on evaluation  today patient actually appears to be doing very well as far as her foot ulcer specifically the great toe on the left is concerned. She did have an area of rubbing on the medial aspect of her left ankle which again is from the cast. Fortunately there's no signs of infection at this point in this appears to be a very slight skin breakdown. The patient tells me she felt it rubbing but didn't think it was that bad. Fortunately there is no signs of active infection at this time which is good news. No fevers, chills, nausea, or vomiting noted at this time. 01/15/19 on evaluation today patient actually appears to be doing well in regard to her toe ulcer. Again as previous she seems to do well and she has the cast on which indicates to me that during the time she doesn't have a cast on she's putting way too much pressure on this region. Obviously I think that's gonna be an issue as with the current national emergency concerning the Covid-19 Virus it has been recommended that we discontinue the use of total contact casting by the chief medical officer of our company, Dr. Maurine Minister. The reasoning is that if a patient becomes sick and cannot come into have the cast removed they could not just leave this on for an additional two weeks. Obviously the hospitals also do not want to receive patient's who are sick into the emergency department to potentially contaminate the region and spread the Covid-19 Virus among other sick individuals within the hospital system. Therefore at this point we are suspending the use of total contact cast until the current emergency subsides. This was all discussed with the patient today as well. 01/22/19 on evaluation today patient's wound on her left great toe appears to be doing slightly worse than previously noted last week. She tells me that she has been on this quite a bit in fact she tells me she's  been awake for 38 straight hours. This is due to the fact that she's having to care for grandparents because nobody else will. She has been taking care of them for five the last seven days since I've seen her they both have dementia his is from a stroke and her grandmother's was progressive. Nonetheless she states even her mom who knows her condition and situation has only help two of those days to take care of them she's been taking care of the rest. Fortunately there does not appear to be any signs of active infection in regard to her toe at this point although obviously it doesn't look as good as it did previous. I think this is directly related to her not taking off the pressure and friction by way of taking things easy. Though I completely understand what's going on. 01/29/19 on evaluation today patient's tools are actually appears to be showing some signs of improvement today compared to last week's evaluation as far as not necessarily the overall size of the wound but the fact that she has some new skin growth in between the two ends of the wound opening. Overall I feel like she has done well she states that she had a family member give her what sounds to be a CAM walker boot which has been helpful as well. 02/05/19 on evaluation today patient's wound bed actually appears to be doing significantly better in regard to her overall appearance of the size of the wound. With that being said she is still having an issue with offloading efficiently enough  to get this to close. Apparently there is some signs of infection at this point as well unfortunately. Previously she's done well of Augmentin I really do not see anything that needs to be culture currently but there theme and cellulitis of the foot that I'm seeing I'm gonna go ahead and place her on an antibiotic today to try to help clear this up. 02/12/2019 on evaluation today patient actually appears to be doing poorly in regard to her overall wound  status. She tells me she has been using her offloading shoe but actually comes in today wearing her tennis shoe with the AFO brace. Again as I previously discussed with her this is really not sufficient to allow the area to heal appropriately. Nonetheless she continues to be somewhat noncompliant and I do wonder based on what she has told my nurse in the past as to whether or not a good portion of this noncompliance may be recreational drug and alcohol related. She has had a history of heroin overdose and this was fairly recently in the past couple of months that have been seeing her. Nonetheless overall I feel like her wound looks significantly worse today compared to what it was previous. She still has significant erythema despite the Augmentin I am not sure that this is an appropriate medication for her infection I am also concerned that the infection may have gone down into her bone. 02/19/19 on evaluation today patient actually appears to be doing about the same in regard to her toe ulcer. Unfortunately she continues to show signs of bone exposure and infection at this point. There does not appear to be any evidence of worsening of the infection but I'm also not really sure that it's getting significantly better. She is on the Augmentin which should be sufficient for the Staphylococcus aureus infection that she has at this point. With that being said she may need IV antibiotics to more appropriately treat this. We did have a discussion today about hyperbaric option therapy. 02/28/19 on evaluation today patient actually appears to be doing much worse in regard to the wound on her left great toe as compared to even my previous evaluation last week. Unfortunately this seems to be training in a pretty poor direction. Her toe was actually now starting to angle laterally and I can actually see the entire joint area of the proximal portion of the digit where is the distal portion of the digit again is no  longer even in contact with the joint line. Unfortunately there's a lot more necrotic tissue around the edge and the toe appears to be showing signs of becoming gangrenous in my pinion. I'm very concerned about were things stand at this point. She did see infectious disease and they are planning to send in a prescription for Sivextro for her and apparently this has been approved. With that being said I don't think she should avoid taking this but at the same time I'm not sure that it's gonna be sufficient to save her toe at this point. 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 BERIT, RACZKOWSKI A ([id number]) [id number] Page 7 of 25 03/05/19 on evaluation today patient actually appears to be doing quite well in regard to her toe all things considering. She still has bone exposed  but there appears to be much less your thing on overall the appearance of the wound and the toe itself is dramatically improved. She still does have some issues currently obviously with infection she did see vascular as well and there concerned that her blood flow to the toad. For that reason they are setting up for an angiogram next week. 03/14/19 on evaluation today patient appears to be doing very poor in regard to her toe and specifically in regard to the ulceration and the fact that she's starting to notice the toe was leaning even more towards the lateral aspect and the complete joint is visible on the proximal aspect of the joint. Nonetheless she's also noted a significant odor and the tip of the toe is turning more dark and necrotic appearing. Overall I think she is getting worse not better as far as this is concerned. For that reason I am recommending at this point that she likely needs to be  seen for likely amputation. READMISSION 03/19/2021 This is a patient that we cared for in this clinic for a prolonged period of time in 2019 and 2020 with a left foot and left first toe wound. 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 Excell Seltzer. In December 2021 she had an ulcer on her right great toe as well as the fourth and fifth toes. 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 She also had an angiogram at that time and underwent angioplasty of the right anterior tibial artery. In any case she claims that the wound on the right foot is closed I did not look at this today which was probably an oversight although I think that should be done next week. After her surgery she developed a dehiscence but I do not see any follow-up of this. According to Dr. Bernette Redbird last review that she was out of the area being cared for by another physician but recently came back to his attention. 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 A culture of this area showed E. coli apparently before she came back to see Dr. Excell Seltzer she was supposed to be receiving antibiotics but she did not really take them. Nor is she offloading this area at all. Finally her last hemoglobin A1c listed in epic was in March 2022 at 14.1 she says things are a lot better since then although I am not sure. 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 She had this wound at the time. During this admission she had E. coli sepsis a TEE was negative. Past medical history is extensive and includes type 2 diabetes with peripheral neuropathy cardiomyopathy with an ejection fraction of 33%, hypertension, hyperlipidemia chronic renal failure stage III history of substance abuse with cocaine although she claims to  be clean now verified by her mother. She is still a heavy cigarette smoker. She has a history of bipolar disorder seizure disorder ABI in our clinic was 1.05 6/1; left midfoot in the setting of a TMA done previously. Round circular wound with a "knuckle" of protruding tissue. The problem is that the knuckle was not attached to any of the surrounding granulation and this probed proximally widely I removed a large portion of this tissue. This wound goes with considerable undermining laterally. I do not feel any bone there  was no purulence but this is a deep wound. 6/8; in spite of the debridement I did last week. She arrives with a wound looking exactly the same. A protruding "knuckle" of tissue nonadherent to most of the surrounding tissue. There is considerable depth around this from 6-12 o'clock at 2.7 cm and undermining of 1 cm. This does not look overtly infected and the x- ray I did last week was negative for any osseous abnormalities. We have been using silver collagen 6/15; deep tissue culture I did last week showed moderate staph aureus and moderate Pseudomonas. This will definitely require prolonged antibiotic therapy. The pathology on the protuberant area was negative for malignancy fungus etc. 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 We have been using silver collagen. I am going to be prescribing Levaquin for 2 weeks. Her CT scan of the foot is down for 7/5 6/22; CT scan of the foot on 7 5. She says she has hardware in the left leg from her previous fracture. She is on the Levaquin for the deep tissue culture I did that showed methicillin sensitive staph aureus and Pseudomonas. I gave her a 2-week supply and she will have another week. She arrives in clinic today with the same protuberant tissue however this is nonadherent to the tissue surrounding it. I am really at a loss to explain this unless there is underlying deep  tissue infection 6/29; patient presents for 1 week follow-up. She has been using collagen to the wound bed. She reports taking her antibiotics as prescribed.She has no complaints or issues today. She denies signs of infection. 7/6; patient presents for one week followup. She has been using collagen to the wound bed. She states she is taking Levaquin however at times she is not able to keep it down. She denies signs of infection. 7/13; patient presents for 1 week follow-up. She has been using silver alginate to the wound bed. She still has nausea when taking her antibiotics. She denies signs of infection. 7/20; patient presents for 1 week follow-up. She has been using silver alginate with gentamicin cream to the wound bed. She denies any issues and has no complaints today. She denies signs of infection. 7/27; patient presents for 1 week follow-up. She continues to use silver alginate with gentamicin cream to the wound bed. She reports starting her antibiotics. She has no issues or complaints. Overall she reports stability to the wound. 8/3; patient presents for 1 week follow-up. She has been using silver alginate with gentamicin cream to the wound bed. She reports completing all antibiotics. She has no issues or complaints today. She denies signs of infection. 8/17; patient presents for 2-week follow-up. He is to use silver alginate to the wound bed. She has no issues or complaints today. She denies signs of infection. She reports her pain has improved in her foot since last clinic visit 8/24; patient presents for 1 week follow-up. She continues to use silver alginate to the wound bed. She has no issues or complaints. She denies signs of infection. Pain is stable. 9/7; patient presents for follow-up. She missed her last week appointment due to feeling ill. She continues to use silver alginate. She has a new wound to the right lower extremity that is covered in eschar. She states It occurred over  the past week and has no idea how it started. She currently denies signs of infection. 9/14; patient presents for follow-up. T the left foot wound she has  been using gentamicin cream and silver alginate. T the right lower extremity wound she has o o been keeping this covered and has not obtain Santyl. 9/21; patient presents for follow-up. She reports using gentamicin cream and silver alginate to the left foot and Santyl to the right lower extremity wound. She has no issues or complaints today. She denies signs of infection. 9/28; patient presents for follow-up. She reports a new wound to her right heel. She states this occurred a few days ago and is progressively gotten worse. She has been trying to clean the area with a Q-tip and Santyl. She reports stability in the other 2 wounds. She has been using gentamicin cream and silver alginate to the left foot and Santyl to the right lower extremity wound. 10/12; patient presents for follow-up. She reports improvement to the wound beds. She is seeing vein and vascular to discuss the potential of a left BKA. She states they are going to do an arteriogram. She continues to use silver alginate with dressing changes to her wounds. 11/2; patient presents for follow-up. She states she has not been doing dressing changes to the wound beds. She states she is not able to offload the areas. She reports chronic pain to her left foot wound. 11/9; patient presents for follow-up. She came in with only socks on. She states she forgot to put on shoes. It is unclear if she is doing any dressing changes. She currently denies systemic signs of infection. 11/16; patient presents for follow-up. She came again only with socks on. She states she does not wear shoes ever. It is unclear if she does dressing changes. She currently denies systemic signs of infection. 11/23; patient presents for follow-up. She wore her shoes today. It still unclear exactly what dressing she is  using for each wound but she did states she obtained Dakin's solution and has been using this to the left foot wound. She currently denies signs of infection. 11/30; patient presents for follow-up. She has no issues or complaints today. She currently denies signs of infection. 12/7; patient presents for follow-up. She has no issues or complaints today. She has been using Hydrofera Blue to the right heel wound and Dakin solution to the left foot wound. Her right anterior leg wound is healed. She currently denies signs of infection. AILED, DEFIBAUGH A ([id number]) [id number] Page 8 of 25 12/14; patient presents for follow-up. She has been using Hydrofera Blue to the right heel and Dakin's to the left foot wounds. She has no issues or complaints today. She denies signs of infection. 12/21; patient presents for follow-up. She reports using Hydrofera Blue to the right heel and Dakin's to the left foot wound. She denies signs of infection. 12/28; patient presents for follow-up. She continues to use Dakin's to the left foot wound and Hydrofera Blue to the right heel wound. She denies signs of infection. 1/4; patient presents for follow-up. She has no issues or complaints today. She denies signs of infection. 1/11; patient presents for follow-up. It is unclear if she has been dressing these wounds over the past week. She currently denies signs of infection. 1/18; patient presents for follow-up. She states she has been using Dakin's wet-to-dry dressings to the left foot. She has been using Hydrofera Blue to the right foot foot wound. She states that the anterior right leg wound has reopened and draining serous fluid. She denies signs of infection. 1/25; patient presents for follow-up. She has no issues or complaints today. 2/1; patient presents  for follow-up. She has no issues or complaints today. She denies signs of infection. 2/8; patient presents for follow-up. She has lost her  surgical shoes. She did not have a dressing to the right heel wound. She currently denies signs of infection. 2/15; patient presents for follow-up. She reports more pain to the right heel today. She denies purulent drainage Or fever/chills 2/22; patient presents for follow-up. She reports taking clindamycin over the past week. She states that she continues to have pain to her right heel. She reports purulent drainage. Readmission 03/16/2022 Ms. Kylan Veach is a 47 year old female with a past medical history of type 2 diabetes, osteomyelitis to her feet, chronic systolic heart failure and bipolar disorder that presents to the clinic for bilateral feet wounds and right lower extremity wound. She was last seen in our clinic on 12/15/2021. At that time she had purulent drainage coming out of her right plantar foot and I recommended she go to the ED. She states she went to [facility] and has been there for the past 3 months. I cannot see the records. She states she had OR debridement and was on several weeks of IV antibiotics while inpatient. Since discharge she has not been taking care of the wound beds. She had nothing on her feet other than socks today. She currently denies signs of infection. 5/31; patient presents for follow-up. She has been using Dakin's wet-to-dry dressings to the wound beds on her feet bilaterally and antibiotic ointment to the right anterior leg wound. She had a wound culture done at last clinic visit that showed moderate Pseudomonas aeruginosa sensitive to ciprofloxacin. She currently denies systemic signs of infection. 6/14; patient presents for follow-up. She received Keystone 5 days ago and has been using this on the wound beds. She states that last week she had to go to the hospital because she had increased warmth and erythema to the right foot. She was started on 2 oral antibiotics. She states she has been taking these. She currently denies systemic signs of  infection. She has no issues or complaints today. 6/21; patient presents for follow-up. She states she has been using Keystone antibiotics to the wound beds. She has no issues or complaints today. She denies signs of infection. 6/28; patient presents for follow-up. She has been using Keystone antibiotics to the wound beds. She has no issues or complaints today. 7/12; patient presents for follow-up. Has been using Keystone antibiotics to the wound beds with calcium alginate. She has no issues or complaints today. She never followed up with her orthopedic surgeon who did the OR debridement to the right foot. We discussed the total contact cast for the left foot and patient would like to do this next week. 7/19; patient presents for follow-up. She has been using Keystone antibiotics with calcium alginate to the wound beds. She has no issues or complaints today. Patient is in agreement to do the total contact cast of the left foot today. She knows to return later this week for the obligatory cast change. 05-13-2022 upon evaluation today patient's wound which she has the cast of the left leg actually appears to be doing significantly better. Fortunately I do not see any signs of active infection locally or systemically which is great news and overall I am extremely pleased with where we stand currently. 7/26; patient presents for follow-up. She has a cast in place for the past week. She states it irritated her shin. Other than that she tolerated the cast well.  She states she would like a break for 1 week from the cast. We have been using Keystone antibiotic and Aquacel to both wound beds. She denies signs of infection. 8/2; patient presents for follow-up. She has been using Keystone and Aquacel to the wound beds. She denies any issues and has no complaints. She is agreeable to have the cast placed today for the left leg. 06-03-2022 upon evaluation today patient appears to be doing well with regard to her  wound she saw some signs of improvement which is great news. Fortunately I do not see any evidence of active infection locally or systemically at this time which is great news. No fevers, chills, nausea, vomiting, or diarrhea. 8/16; patient presents for follow-up. She has no issues or complaints today. We have been using Keystone and Aquacel to the wound beds. The left lower extremity is in a total contact cast. She is tolerated this well. 8/23; patient presents for follow-up. She has had the total contact cast on the left leg for the past week. Unfortunately this has rubbed and broken down the skin to the medial foot. She currently denies signs of infection. She has been using Keystone antibiotic to the right plantar foot wound. 8/30; patient presents for follow-up. We have held off on the total contact cast for the left leg for the past week. Her wound on the left foot has improved and the previous surrounding breakdown of skin has epithelialized. She has been using Keystone antibiotic to both wound beds. She has no issues or complaints today. She denies signs of infection. 9/6; patient presents for follow-up. She has ordered her's Keystone antibiotic and this is arriving this week. She has been doing Dakin's wet-to-dry dressings to the wound beds. She denies signs of infection. She is agreeable to the total contact cast today. 9/13; patient presents for follow-up. She states that the cast caused her left leg shin to be sore. She would like to take a break from the cast this week. She has been using Keystone antibiotic to the right plantar foot wound. She denies signs of infection. 9/20; patient presents for follow-up. She has been using Keystone antibiotics to the wound beds with calcium alginate to the right foot wound and Hydrofera Blue to the left foot wound. She is agreeable to having the cast placed today. She has been approved for Apligraf and we will order this for next clinic visit. 9/27;  patient presents for follow-up. We have been using Keystone antibiotic with Hydrofera Blue to the left foot wound under a total contact cast. T the right o foot wound she has been using Keystone antibiotic and calcium alginate. She declines a total contact cast today. Apligraf is available for placement and she would like to proceed with this. JULIETTA, BATTERMAN A ([id number]) [id number] Page 9 of 25 07-28-2022 upon evaluation today patient appears to be doing well currently in regard to her wound. She is actually showing signs of significant improvement which is great news. Fortunately I do not see any evidence of active infection locally nor systemically at this time. She has been seeing Dr. Mikey Bussing and to be honest has been doing very well with the cast. Subsequently she comes in today with a cast on and we did reapply that today as well. She did not really want to she try to talk me out of that but I explained that if she wanted to heal this is really the right way to go. Patient voiced understanding. In regard to her  right foot this is actually a lot better compared to the last time I saw her which is also great news. 10/11; patient presents for follow-up. Apligraf and the total contact cast was placed to the left leg at last clinic visit. She states that her right foot wound had burning pain to it with the placement of Apligraf to this area. She has been doing [place] over this area. She denies signs of infection including increased warmth, erythema or purulent drainage. 11/1; 3-week follow-up. The patient fortunately did not have a total contact cast or an Apligraf and on the left foot. She has been using Keystone ABD pads and kerlix and her own running shoes She arrives in clinic today with thick callus and a very poor surface on the left foot on the right nonviable skin subcutaneous tissue and a deep probing hole. 11/15; patient missed her last clinic appointment. She  states she has not been dressing the wound beds for the past 2 weeks. She states that at she had a new roommate but is now going back to live with her mother. Apparently its been a distracting 2 weeks. Patient currently denies signs of infection. 11/22; patient presents for follow-up. She states she has been using Keystone antibiotic and Dakin's wet-to-dry dressings to the wound beds. She is agreeable for cast placement today. We had ordered Apligraf however this has not been received by our facility. 11/29; Patient had a total contact cast placed at last clinic visit and she tolerated this well. We were using silver alginate under the cast. Patient's been using Keystone antibiotic with Aquacel to the right plantar foot wound. She has no issues or complaints today. Apligraf is available for placement today. Patient would like to proceed with this. 12/6; patient presents for follow-up. She had Apligraf placed in standard fashion last clinic visit under the total contact cast to the left lower extremity. She has been using Keystone antibiotic and Aquacel to the right plantar foot wound. She has no issues or complaints today. 12/13; patient presents for follow-up. She has finished 5 Apligraf placements. Was told she would not qualify for more. We have been doing a total contact cast to the left lower extremity. She has been using Keystone antibiotic and Aquacel to the right plantar foot wound. She has no issues or complaints today. 12/20; patient presents for follow-up. We have been using Hydrofera Blue with Keystone antibiotic under a total contact cast of the left lower extremity. She reports using Keystone antibiotic and silver alginate to the right heel wound. She has no issues or complaints today. 12/27; patient presents with a healthy wound on the left midfoot. We have Apligraf to apply that to that more also using a total contact cast. On the right we are using Keystone and silver alginate. She is  offloading the right heel with a surgical shoealthough by her admission she is on her feet quite a bit 1/3; patient presents for follow-up. Apligraf was placed to the wound beds last clinic visit. She was placed in a total contact cast to the left lower extremity. She declines a total contact cast today. She states that her mother is in the hospital and she cannot adequately get around with the cast on. 1/10; patient presents for follow-up. She declined the total contact cast at last clinic visit. Both wounds have declined in appearance. She states that she has been on her feet and not offloading the wound beds. She currently denies signs of infection. 1/17; patient presents  for follow-up. She had the total contact cast along with Apligraf placed last week to the left lower extremity. She tolerated this well. She has been using Aquacel Ag and Keystone antibiotic to the right heel wound. She currently denies signs of infection. She has no issues or complaints today. 1/24; patient presents for follow-up. We have been using Apligraf to the left foot wound along with a total contact cast. She has done well with this. T the right o heel wound she has been using Aquacel Ag and Keystone antibiotic ointment. She has no issues or complaints today. She denies signs of infection. 1/31; patient presents for follow-up. We have been using Apligraf to the left foot wound along with the total contact cast. She continues to do well with this. To the right heel we have been using Aquacel Ag and Keystone antibiotic ointment. She has no issues or complaints today. 12-01-2022 upon evaluation patient is seen today on my schedule due to the fact that she unfortunately was in the hospital yesterday. Her cast needed to come off the only reason she is out of the hospital is due to the fact that they would not take it off at the hospital which is somewhat bewildered reading to me to be perfectly honest. I am not certain why this  was but either way she was released and then was placed on my schedule today in order to get this off and reapply the total contact casting as appropriate. I do not have an Apligraf for her today it was applied last week and today's actually expired yesterday as there was some scheduling conflicts with her being in the hospital. Nonetheless we do not have that for reapplication today but the good news that she is not draining too much and the Apligraf can go for up to 2 weeks so I am going to go ahead and reapply the total contact casting but we are going to leave the Apligraf in place. 2/14; patient presents for follow-up. T the left leg she has had the total contact cast and Apligraf for the past week. She has had no issues with this. T the o o right heel she has been using antibiotic ointment and Aquacel Ag. 2/21; patient presents for follow-up. We have been using Apligraf and a total contact cast to the left lower extremity. She is tolerated this well. Unfortunately she is not approved for any more Apligraf per insurance. She has been using antibiotic ointment and Aquacel Ag to the right foot. She has no issues or complaints today. 2/28; patient presents for follow-up. We have been using Hydrofera Blue and antibiotic ointment under the total contact cast to the left lower extremity. He has been using Aquacel Ag with antibiotic ointment to the right plantar foot. She has no issues or complaints today. 3/6; patient presents for follow-up. She did not obtain her gentamicin ointment. She has been using Aquacel Ag to the right plantar foot wound. We have been using Hydrofera Blue with antibiotic ointment under the total contact cast to the left lower extremity. She has no issues or complaints today. 3/12; patient presents for follow-up. She has been using gentamicin ointment and Aquacel Ag to the right plantar foot wound. We have been using Hydrofera Blue with antibiotic ointment under the total  contact cast on the left lower extremity foot wound. She has no issues or complaints today. 3/20; patient presents for follow-up. She has been using gentamicin ointment and Aquacel Ag to the right plantar foot  wound. We have been using Hydrofera Blue with antibiotic ointment under the total contact cast to the left lower extremity wound. She has no issues or complaints today. 3/27; patient presents for follow-up. We have been using antibiotic ointment and Aquacel Ag to the right plantar foot wound. We have been using Hydrofera Blue with antibiotic ointment under the total contact cast to the left lower extremity. Both wounds are smaller. 01-24-2023 upon evaluation today patient actually appears to be doing better compared to last time I saw her. It has been several weeks since I last saw her but nonetheless I think the total contact casting is doing a good job. Fortunately I do not see any evidence of infection or worsening overall which is great news and in general I do believe that she is moving in the appropriate direction. It has been a very long road but nonetheless I feel like she is nearing the end at least in regard to the left foot and even the right foot looks much better to be perfectly honest. 01-31-2023 upon evaluation today patient appears to be doing well currently in regard to her wounds. She has been require some sharp debridement which I think is probably bone both sides to remove a lot of callus that has built up at this point. I discussed that with her today. Also think total contact cast is doing well for the left foot Emina continue that as well. 4/17; patient presents for follow-up. We have been doing Hydrofera Blue with antibiotic ointment under the total contact cast to the left lower extremity and Aquacel Ag with antibiotic ointment to the right foot wound. She has been using her offloading heel shoe here. ZELIE, ASBILL A ([id number]) [id number] Page  10 of 25 4/24; patient presents for follow-up. We have been doing Hydrofera Blue with antibiotic ointment under the total contact cast to the left lower extremity and Aquacel Ag with antibiotic ointment to the right foot wound. She has been using her offloading heel shoe here. Wounds are smaller with slough accumulation. 5/1; patient presents for follow-up. We have been doing Hydrofera Blue with antibiotic ointment under the total contact cast to the left lower extremity and Aquacel Ag with antibiotic ointment to the right foot wound. Left foot wound is stable and right foot wound appears smaller. 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 On the left she is using Hydrofera Blue topical antibiotic under a total contact cast, on the right plantar heel gentamicin Aquacel Ag. According to our intake nurse both are making slow but steady improvements. 5/15; patient presents for follow-up. We have been doing Hydrofera Blue with antibiotic ointment under the total contact cast on the left and antibiotic ointment with Aquacel Ag on the right. Unfortunately the left foot wound has declined in size and appearance. We also do not have the total contact cast available in office today. Patient denies signs of infection. 5/22; patient presents for follow-up. We have been doing Hydrofera Blue and antibiotic ointment to the left foot and Aquacel Ag with antibiotic ointment to the right foot. We took a break from the cast since we did not have it placed at last week. Wounds look stable if not slightly improved. 03-27-2023 patient presents today she has had this For Most 2 Weeks Because She Was in the Hospital and They Did Not T It off. Again That Is Really Not ake Something That She Could Control but Nonetheless We Are Seeing Her at This Point Today for Reevaluation. We Do  Want to Go and See about Reapply the Cast. Again We Had All Set Everything up to Go Ahead and Do This When We Went to Get the Cast Only to Realize  That Not Had Come in and We Were out at the Moment. With That Being [place] Spaniel We Will Get Have T Get the Exam before We Can Reapply the T Contact Casting Going Forward. o otal 6/12; patient presents for follow-up. She has been using Hydrofera Blue to the left foot wound and Aquacel Ag to the right foot wound. No cast was available last week placed on the patient so she has been offloading with her surgical shoe. She has no issues or complaints today. 6/19; patient presents for follow-up. We have been using Hydrofera Blue to the left foot wound under the total contact cast and Aquacel Ag to the right wound. She has no issues or complaints today. The wounds are smaller. 6/26; patient presents for follow-up. We have been using Hydrofera Blue to the left foot wound under the total contact cast and Aquacel Ag to the right foot wound. Wounds are smaller. Patient has no issues or complaints today. 7/3; patient presents for follow-up. We have been using Hydrofera Blue to the left foot wound under the total contact cast and Aquacel Ag to the right foot wound. Wounds Appear smaller. No signs of infection. 7/10; patient presents for follow-up. We have been using Hydrofera Blue to the left foot wound under the total contact cast and Aquacel Ag to the right foot wound. Left foot wound is smaller. Right foot wound is stable. 7/17; patient presents for follow-up. We have been using Hydrofera Blue to the left foot under the total contact cast and Aquacel Ag to the right foot wound. She has no issues or complaints today. 7/24; patient presents for follow-up. We have been using Hydrofera Blue to the left foot under the total contact cast and Aquacel Ag to the right foot wound. Unfortunately she was bit by her dog over the weekend and developed a wound to the right lower leg. She has been keeping the area covered. 7/31; patient presents for follow-up. We have been using Hydrofera Blue to the left foot under the total  contact cast and Aquacel Ag to the right foot wound and mupirocin ointment to the right anterior leg wound. She started her oral antibiotics 2 days ago. Electronic Signature(s) Signed: 05/24/2023 4:29:16 PM By: [name] Corwin DO Entered By: [name] [name] on 05/24/2023 12:41:52 -------------------------------------------------------------------------------- Physical Exam Details Patient Name: Date of Service: MARIELLEN, BLANEY 05/24/2023 11:00 A M Medical Record Number: [id number] Patient Account Number: [id number] Date of Birth/Sex: Treating RN: 03/01/1976 (47 y.o. Skip Mayer Primary Care Provider: [name] Other Clinician: [name] Referring Provider: Treating Provider/Extender: Chapman Moss in Treatment: 57 Constitutional . Cardiovascular . Psychiatric . CASSADEE, VANZANDT A ([id number]) [id number] Page 11 of 25 Notes Right foot: T the plantar heel there is an incision site with increased depth, Granulation tissue and slough at the opening. Left foot: T the medial aspect there o o is an open wound with granulation tissue, Callus and slough build up. No signs of infection to the feet wounds. Right lower extremity: T the anterior aspect o there is an open wound with nonviable tissue and granulation tissue. No signs of infection. Electronic Signature(s) Signed: 05/24/2023 4:29:16 PM By: [name] Corwin DO Entered By: [name] [name] on 05/24/2023 12:42:36 -------------------------------------------------------------------------------- Physician Orders Details Patient Name: Date of Service: [name], Shaylynn A.  05/24/2023 11:00 A M Medical Record Number: [id number] Patient Account Number: [id number] Date of Birth/Sex: Treating RN: 08-02-1976 (47 y.o. Skip Mayer Primary Care Provider: [name] Other Clinician: [name] Referring Provider: Treating Provider/Extender: Chapman Moss in Treatment: 2 Verbal / Phone Orders: Yes Clinician: Huel Coventry Read Back and Verified: Yes Diagnosis Coding Follow-up Appointments Return Appointment in 1 week. Bathing/ Shower/ Hygiene May shower with wound dressing protected with water repellent cover or cast protector. No tub bath. Anesthetic (Use 'Patient Medications' Section for Anesthetic Order Entry) Lidocaine applied to wound bed Off-Loading Total Contact Cast to Left Lower Extremity Open toe surgical shoe - right Wound Treatment Wound #11 - Foot Wound Laterality: Plantar, Right Cleanser: Soap and Water 3 x Per Week/30 Days Discharge Instructions: Gently cleanse wound with antibacterial soap, rinse and pat dry prior to dressing wounds Cleanser: Wound Cleanser 3 x Per Week/30 Days Discharge Instructions: Wash your hands with soap and water. Remove old dressing, discard into plastic bag and place into trash. Cleanse the wound with Wound Cleanser prior to applying a clean dressing using gauze sponges, not tissues or cotton balls. Do not scrub or use excessive force. Pat dry using gauze sponges, not tissue or cotton balls. Prim Dressing: Aquacel Extra Hydrofiber Dressing, 2x2 (in/in) ary 3 x Per Week/30 Days Secondary Dressing: ABD Pad 5x9 (in/in) 3 x Per Week/30 Days Discharge Instructions: Cover with ABD pad Secondary Dressing: 3 x Per Week/30 Days Secured With: Medipore T - 68M Medipore H Soft Cloth Surgical T ape ape, 2x2 (in/yd) 3 x Per Week/30 Days Secured With: [employer] or Non-Sterile 6-ply 4.5x4 (yd/yd) 3 x Per Week/30 Days Discharge Instructions: Apply Kerlix as directed Wound #12 - Foot Wound Laterality: Plantar, Left, Medial Cleanser: Soap and Water 1 x Per Week/30 Days Discharge Instructions: Gently cleanse wound with antibacterial soap, rinse and pat dry prior to dressing wounds Cleanser: Wound Cleanser 1 x Per Week/30 Days Discharge Instructions: Wash your hands with soap and water.  Remove old dressing, discard into plastic bag and place into trash. Cleanse the ARRIEL, VICTOR A ([id number]) [id number] Page 12 of 25 wound with Wound Cleanser prior to applying a clean dressing using gauze sponges, not tissues or cotton balls. Do not scrub or use excessive force. Pat dry using gauze sponges, not tissue or cotton balls. Topical: Gentamicin 1 x Per Week/30 Days Discharge Instructions: Apply as directed by provider. Topical: Mupirocin Ointment 1 x Per Week/30 Days Discharge Instructions: Apply as directed by provider. Prim Dressing: Hydrofera Blue Ready Transfer Foam, 2.5x2.5 (in/in) 1 x Per Week/30 Days ary Discharge Instructions: Apply Hydrofera Blue Ready to wound bed as directed Secondary Dressing: Gauze 1 x Per Week/30 Days Discharge Instructions: As directed: dry Secondary Dressing: heel cup 1 x Per Week/30 Days Secured With: Medipore T - 68M Medipore H Soft Cloth Surgical T ape ape, 2x2 (in/yd) 1 x Per Week/30 Days Secured With: [employer] or Non-Sterile 6-ply 4.5x4 (yd/yd) 1 x Per Week/30 Days Discharge Instructions: Apply Kerlix as directed Wound #14 - Lower Leg Wound Laterality: Right, Anterior Cleanser: Normal Saline 1 x Per Day Discharge Instructions: Wash your hands with soap and water. Remove old dressing, discard into plastic bag and place into trash. Cleanse the wound with Normal Saline prior to applying a clean dressing using gauze sponges, not tissues or cotton balls. Do not scrub or use excessive force. Pat dry using gauze sponges, not tissue or cotton balls. Topical: Mupirocin Ointment 1 x  Per Day Discharge Instructions: Apply as directed by provider. Secondary Dressing: (BORDER) Zetuvit Plus SILICONE BORDER Dressing 4x4 (in/in) 1 x Per Day Discharge Instructions: Please do not put silicone bordered dressings under wraps. Use non-bordered dressing only. Electronic Signature(s) Signed: 05/24/2023 4:29:16 PM By: [name] Corwin DO Entered By: [name] [name] on 05/24/2023 12:46:53 -------------------------------------------------------------------------------- Problem List Details Patient Name: Date of Service: [name] A. 05/24/2023 11:00 A M Medical Record Number: [id number] Patient Account Number: [id number] Date of Birth/Sex: Treating RN: 08/02/1976 (47 y.o. 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 03/16/2022 No Yes Grose, Jamicia A ([id number]) [id number] Page 13 of 25 E11.621 Type 2 diabetes mellitus with foot ulcer 03/16/2022 No Yes L97.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 03/16/2022 No Yes of skin W54.0XXA Bitten by dog, initial encounter 05/17/2023 No Yes S81.801A Unspecified open wound, right lower leg, initial encounter 05/17/2023 No Yes E11.622 Type 2 diabetes mellitus with other skin ulcer 05/17/2023 No Yes E11.42 Type 2 diabetes mellitus with diabetic polyneuropathy 03/16/2022 No Yes Z79.4 Long term (current) use of insulin 02/22/2023 No Yes Inactive Problems Resolved Problems Electronic Signature(s) Signed: 05/24/2023 4:29:16 PM By: [name] Corwin DO Entered By: [name] [name] on 05/24/2023 12:34:39 -------------------------------------------------------------------------------- Progress Note Details Patient Name: Date of Service: [name] A. 05/24/2023 11:00 A M Medical Record Number: [id number] Patient Account Number: [id number] Date of Birth/Sex: Treating RN: 1976/10/10 (47 y.o. Skip Mayer Primary Care Provider: [name] Other Clinician: [name] Referring Provider: Treating Provider/Extender: Chapman Moss in Treatment: 21 Subjective Chief Complaint Information obtained from Patient 03/19/2021; patient referred by Dr. Excell Seltzer who has been looking after her left foot for quite a period of time for review of a nonhealing area in the left midfoot 03/12/2022; bilateral feet wounds and right lower extremity wound. History of Present Illness (HPI) 01/18/18-She is here for initial evaluation of the left great toe ulcer. She is a poor historian in regards to timeframe in detail. She states approximately 4 weeks ago she lacerated her toe on something in the house. She followed up with her primary care who placed her on Bactrim and ultimately a second dose of Bactrim prior to coming to wound clinic. She states she has been treating the toe with peroxide, Betadine and a Band-Aid. She did not check her blood sugar this morning but checked it yesterday morning it was 327; she is unaware of a recent A1c and there are no current records. She saw Dr. she would've orthopedics last week for an old injury to the left ankle, she states he did not see her toe, nor did she bring it to his attention. She smokes approximately 1 pack cigarettes a day. Her social situation is concerning, she arrives this morning with her mother who appears extremely intoxicated/under the influence; her mother was [name], OTWELL ([id number]) [id number] Page 14 of 25 asked to leave the room and be monitored by the patient's grandmother. The patient's aunt then accompanied the patient and the room throughout the rest of the appointment. We had a lengthy discussion regarding the deleterious effects of uncontrolled hyperglycemia and smoking as  it relates to wound healing and overall health. She was strongly encouraged to decrease her smoking and get her  diabetes under better control. She states she is currently on a diet and has cut down her [facility] consumption. The left toe is erythematous, macerated and slightly edematous with malodor present. The edema in her left foot is below her baseline, there is no erythema streaking. We will treat her with Santyl, doxycycline; we have ordered and xray, culture and provided a Peg assist surgical shoe and cultured the wound. 01/25/18-She is here in follow-up evaluation for a left great toe ulcer and presents with an abscess to her suprapubic area. She states her blood sugars remain elevated, feeling "sick" and if levels are below 250, but she is trying. She has made no attempt to decrease her smoking stating that we "can't take away her food in her cigarettes". She has been compliant with offloading using the PEG assist you. She is using Santyl daily. the culture obtained last week grew staph aureus and Enterococcus faecalis; continues on the doxycycline and Augmentin was added on Monday. The suprapubic area has erythema, no femoral variation, purple discoloration, minimal induration, was accessed with a cotton tip applicator with sanguinopurulent drainage, this was cultured, I suspect the current antibiotic treatment will cover and we will not add anything to her current treatment plan. She was advised to go to urgent care or ER with any change in redness, induration or fever. 02/01/18-She is here in follow-up evaluation for left great toe ulcers and a new abdominal abscess from last week. She was able to use packing until earlier this week, where she "forgot it was there". She states she was feeling ill with GI symptoms last week and was not taking her antibiotic. She states her glucose levels have been predominantly less than 200, with occasional levels between 200-250. She thinks this was contributing to her GI symptoms as they have resolved without intervention. There continues to be significant  laceration to left toe, otherwise it clinically looks stable/improved. There is now less superficial opening to the lateral aspect of the great toe that was residual blister. We will transition to [facility] to all wounds, she will continue her Augmentin. If there is no change or deterioration next week for reculture. 02/08/18-She is here in follow-up evaluation for left great toe ulcer and abdominal ulcer. There is an improvement in both wounds. She has been wrapping her left toe with coban, not by our direction, which has created an area of discoloration to the medial aspect; she has been advised to NOT use coban secondary to her neuropathy. She states her glucose levels have been high over this last week ranging from 200-350, she continues to smoke. She admits to being less compliant with her offloading shoe. We will continue with same treatment plan and she will follow-up next week. 02/15/18-She is here in follow-up evaluation for left great toe ulcer and abdominal ulcer. The abdominal ulcer is epithelialized. The left great toe ulcer is improved and all injury from last week using the Coban wrap is resolved, the lateral ulcer is healed. She admits to noncompliance with wearing offloading shoe and admits to glucose levels being greater than 300 most of the week. She continues to smoke and expresses no desire to quit. There is one area medially that probes deeper than it has historically, erythema to the toe and dorsal foot has consistently waxed and waned. There is no overt signs of cellulitis or infection but we will  culture the wound for any occult infection given the new area of depth and erythema. We will hold off on sensitivities for initiation of antibiotic therapy. 02/22/18-She is here in follow up evaluation for left great toe ulcer. There is overall significant improvement in both wound appearance, erythema and edema with changes made last week. She was not initiated on antibiotic therapy.  Culture obtained last week showed oxacillin sensitive staph aureus, sensitive to clindamycin. Clindamycin has been called into the pharmacy but she has been instructed to hold off on initiation secondary to overall clinical improvement and her history of antibiotic intolerance. She has been instructed to contact the clinic with any noted changes/deterioration and the wound, erythema, edema and/or pain. She will follow-up next week. She continues to smoke and her glucose levels remain elevated >250; she admits to compliance with offloading shoe 03/01/18 on evaluation today patient appears to be doing fairly well in regard to her left first toe ulcer. She has been tolerating the dressing changes with the [facility] Dressing without complication and overall this has definitely showed signs of improvement according to records as well is what the patient tells me today. I'm very pleased in that regard. She is having no pain today 03/08/18 She is here for follow up evaluation of a left great toe ulcer. She remains non-compliant with glucose control and smoking cessation; glucose levels consistently >200. She states that she got new shoe inserts/peg assist. She admits to compliance with offloading. Since my last evaluation there is significant improvement. We will switch to prisma at this time and she will follow up next week. She is noted to be tachycardic at this appointment, heart rate 120s; she has a history of heart rate 70-130 according to our records. She admits to extreme agitation r/t personal issues; she was advised to monitor her heartrate and contact her physician if it does not return to a more normal range (<100). She takes cardizem twice daily. 03/15/18-She is here in follow-up evaluation for left great toe ulcer. She remains noncompliant with glucose control and smoking cessation. She admits to compliance with wearing offloading shoe. The ulcer is improved/stable and we will continue with the  same treatment plan and she will follow-up next week 03/22/18-She is here for evaluation for left great toe ulcer. There continues to be significant improvement despite recurrent hyperglycemia (over 500 yesterday) and she continues to smoke. She has been compliant with offloading and we will continue with same treatment plan and she will follow-up next week. 03/29/18-She is here for evaluation for left great toe ulcer. Despite continuing to smoke and uncontrolled diabetes she continues to improve. She is compliant with offloading shoe. We will continue with the same treatment plan and she will follow-up next week 04/05/18- She is here in follow up evaluation for a left great toe ulcer; she presents with small pustule to left fifth toe (resembles ant bite). She admits to compliance with wearing offloading shoe; continues to smoke or have uncontrolled blood glucose control. There is more callus than usual with evidence of bleeding; she denies known trauma. 04/12/18-She is here for evaluation of left great toe ulcer. Despite noncompliance with glycemic control and smoking she continues to make improvement. She continues to wear offloading shoe. The pustule, that was identified last week, to the left fifth toe is resolved. She will follow-up in 2 weeks 05/03/18-she is seen in follow-up evaluation for a left great toe ulcer. She is compliant with offloading, otherwise noncompliant with glycemic control and smoking.  She has plateaued and there is minimal improvement noted. We will transition to [facility], replaced the insert to her surgical shoe and she will follow-up in one week 05/10/18- She is here in follow up evaluation for a left great toe ulcer. It appears stable despite measurement change. We will continue with same treatment plan and follow up next week. 05/24/18-She is seen in follow-up evaluation for a left great toe ulcer. She remains compliant with offloading, has made significant improvement in  her diet, decreasing the amount of sugar/soda. She said her recent A1c was 10.9 which is lower than. She did see a diabetic nutritionist/educator yesterday. She continues to smoke. We will continue with the same treatment plan and she'll follow-up next week. 05/31/18- She is seen in follow-up evaluation for left great toe ulcer. She continues to remain compliant with offloading, continues to make improvement in her diet, increasing her water and decreasing the amount of sugar/soda. She does continue to smoke with no desire to quit. We will apply Prisma to the depth and Hydrofera Blue over. We have not received insurance authorization for oasis. She will follow up next week. 06/07/18-She is seen in follow-up evaluation for left great toe ulcer. It has stalled according to today's measurements although base appears stable. She says she saw a diabetic educator yesterday; her average blood sugars are less than 300 which is an improvement for her. She continues to smoke and states "that's my next step" She continues with water over soda. We will order for xray, culture and reinstate ace wrap compression prior to placing apligraf for next week. She is voicing no complaints or concerns. Her dressing will change to iodoflex over the next week in preparation for apligraf. 06/14/18-She is seen in follow-up evaluation for left great toe ulcer. Plain film x-ray performed last week was negative for osteomyelitis. Wound culture obtained last week grew strep B and OSSA; she is initiated on keflex and cefdinir today; there is erythema to the toe which could be from ace wrap compression, she has a history of wrapping too tight and has has been encouraged to maintain ace wraps that we place today. We will hold off on application of apligraf today, will apply next week after antibiotic therapy has been initiated. She admits today that she has resumed taking a shower with her foot/toe submerged in water, she has been reminded  to keep foot/toe out of the bath water. She will be seen in follow up next week 06/21/18-she is seen in follow-up evaluation for left great toe ulcer. She is tolerating antibiotic therapy with no GI disturbance. The wound is stable. Apligraf was applied today. She has been decreasing her smoking, only had 4 cigarettes yesterday and 1 today. She continues being more compliant in diabetic diet. She will follow-up next week for evaluation of site, if stable will remove at 2 weeks. 06/28/18- She is here in follow up evalution. Apligraf was placed last week, she states the dressing fell off on Tuesday and she was dressing with hydrofera blue. She is healed and will be discharged from the clinic today. She has been instructed to continue with smoking cessation, continue monitoring glucose levels, offloading for an additional 4 weeks and continue with hydrofera blue for additional two weeks for any possible microscopic opening. Readmission: 08/07/18 on evaluation today patient presents for reevaluation concerning the ulcer of her right great toe. She was previously discharged on 06/28/18 healed. Nonetheless she states that this began to show signs of drainage she subsequently  went to her primary care provider. Subsequently an x-ray was performed on 08/01/18 which was negative. The patient was also placed on antibiotics at that time. Fortunately they should have been effective for the infection. Nonetheless she's been experiencing some improvement but still has a lot of drainage coming from the wound itself. 08/14/18 on evaluation today patient's wound actually does show signs of improvement in regard to the erythema at this point. She has completed the antibiotics. With that being said we did discuss the possibility of placing her in a total contact cast as of today although I think that I may want to give this just a little bit more time to ensure nothing recurrence as far as her infection is concerned. I do not  want to put in the cast and risk infection at that time if things HAYLIE, MCCUTCHEON A ([id number]) [id number] Page 15 of 25 are not completely resolved. With that being said she is gonna require some debridement today. 08/21/18 on evaluation today patient actually appears to be doing okay in regard to her toe ulcer. She's been tolerating the dressing changes without complication. With that being said it does appear that she is ready and in fact I think it's appropriate for [place] to go ahead and initiate the total contact cast today. Nonetheless she will require some sharp debridement to prepare the wound for application. Overall I feel like things have been progressing well but we do need to do something to get this to close more readily. 08/24/18 patient seen today for reevaluation after having had the total contact cast applied on Tuesday. She seems to have done very well the wound appears to be doing great and overall I'm pleased with the progress that she's made. There were no abnormal areas of rubbing from the cast on her lower extremity. 08/30/18 on evaluation today patient actually appears to be completely healed in regard to her plantar toe ulcer. She tells me at this point she's been having a lot of issues with the cast. She almost fell a couple of times the state shall the step of her dog a couple times as well. This is been a very frustrating process for her other nonetheless she has completely healed the wound which is excellent news. Overall there does not appear to be the evidence of infection at this time which is great news. 09/11/18 evaluation today patient presents for follow-up concerning her great toe ulcer on the left which has unfortunately reopened since I last saw her which was only a couple of weeks ago. Unfortunately she was not able to get in to get the shoe and potentially the AFO that's gonna be necessary due to her left foot drop. She continues with  offloading shoe but this is not enough to prevent her from reopening it appears. When we last had her in the total contact cast she did well from a healing standpoint but unfortunately the wound reopened as soon as she came out of the cast within just a couple of weeks. 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 This is also part of the reason why she falls on a regular basis. I just do not believe that is gonna be safe for [place] to reinitiate the total contact cast as last time we  had this on she fell 3 times one week which is definitely not normal for her. 09/18/18 upon evaluation today the patient actually appears to be doing about the same in regard to her toe ulcer. She did not contact [facility] as I asked her to even though I had given her the prescription. In fact she actually states that she has no idea where the prescription is. She did apparently call [facility] and they told her that all she needed to do was bring the prescription in order to be able to be seen and work on getting the AFO for her left foot. With all that being said she still does not have an appointment and I'm not sure were things stand that regard. I will give her a new prescription today in order to contact them to get this set up. 09/25/18 on evaluation today patient actually appears to be doing about the same in regard to her toes ulcer. She does have a small areas which seems to have a lot of callous buildup around the edge of the wound which is going to need sharp debridement today. She still is waiting to be scheduled for evaluation with [facility] for possibility of an AFO. She states there supposed to call her tomorrow to get this set up. Unfortunately it does appear that her foot specifically the toe area is showing signs of  erythema. There does not appear to be any systemic infection which is in these good news. 10/02/18 on evaluation today patient actually appears to be doing about the same in regard to her toe ulcer. This really has not done too well although it's not significantly larger it's also not significantly smaller. She has been tolerating the dressing changes without complication. She actually has her appointment with [facility] and [place] tomorrow to hopefully be measured for obtaining and AFO splint. I think this would be helpful preventing this from reoccurring. We had contemplated starting the cast this week although to be honest I am reluctant to do that as she's been having nausea, vomiting, and seizure activity over the past three days. She has a history of seizures and have been told is nothing that can be done for these. With that being said I do believe that along with the seizures have the nausea vomiting which upon further questioning doesn't seem to be the normal for her and makes me concerned for the possibility of infection or something else going on. I discussed this with the patient and her mother during the office visit today. I do not feel the wound is effective but maybe something else. The responses this was "this just happens to her at times and we don't know why". They did not seem to be interested in going to the hospital to have this checked out further. 10/09/18 on evaluation today patient presents for follow-up concerning her ongoing toe ulcer. She has been tolerating the dressing changes without complication. Fortunately there does not appear to be any evidence of infection which is great news however I do think that the patient would benefit from going ahead for with the total contact cast. She's actually in a wheelchair today she tells me that she will use her walker if we initiate the cast. I was very specific about the fact that if we were gonna do the cast I wanted to make sure  that she was using the walker in order to prevent any falls. She tells me she does not have stairs that she has to traverse  on a regular basis at her home. She has not had any seizures since last week again that something that happens to her often she tells me she did talk to [employer] and they said that it may take up to three weeks to get the brace approved for her. Hopefully that will not take that long but nonetheless in the meantime I do think the cast could be of benefit. 10/12/18 on evaluation today patient appears to be doing rather well in regard to her toe ulcer. It's just been a few days and already this is significantly improved both as far as overall appearance and size. Fortunately there's no sign of infection. She is here for her first obligatory cast change. 10/19/18 Seen today for follow up and management of left great toe ulcer. Wound continues to show improvement. Noted small open area with seroussang drainage with palpation. Denies any increased pain or recent fevers during visit. She will continue calcium alginate with offloading shoe. Denies any questions or concerns during visit. 10/26/18 on evaluation today patient appears to be doing about the same as when I last saw her in regard to her wound bed. Fortunately there does not appear to be any signs of infection. Unfortunately she continues to have a breakdown in regard to the toe region any time that she is not in the cast. It takes almost no time at all for this to happen. Nonetheless she still has not heard anything from the brace being made by [employer] as to when exactly this will be available to her. Fortunately there is no signs of infection at this time. 10/30/18 on evaluation today patient presents for application of the total contact cast as we just received him this morning. Fortunately we are gonna be able to apply this to her today which is great news. She continues to have no significant pain which is good news. Overall I  do feel like things have been improving while she was the cast is when she doesn't have a cast that things get worse. She still has not really heard anything from [employer] regarding her brace. 11/02/18 upon evaluation today patient's wound already appears to be doing significantly better which is good news. Fortunately there does not appear to be any signs of infection also good news. Overall I do think the total contact cast as before is helping to heal this area unfortunately it's just not gonna likely keep the area closed and healed without her getting her brace at least. Again the foot drop is a significant issue for her. 11/09/18 on evaluation today patient appears to be doing excellent in regard to her toe ulcer which in fact is completely healed. Fortunately we finally got the situation squared away with the paperwork which was needed to proceed with getting her brace approved by Medicaid. I have filled that out unfortunately that information has been sent to the orthopedic office that I worked at 2 1/2 years ago and not tired Current wound care measures. Fortunately she seems to be doing very well at this time. 11/23/18 on evaluation today patient appears to be doing More Poorly Compared to Last Time I Saw Her. At [facility] She Had Completely Healed. Currently she is continuing to have issues with reopening. She states that she just found out that the brace was approved through Medicaid now she just has to go get measured in order to have this fitted for her and then made. Subsequently she does not have an appointment for this yet  that is going to complicate things we obviously cannot put her back in the cast if we do not have everything measured because they're not gonna be able to measure her foot while she is in the cast. 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 Obviously this is unfortunate  and does have me somewhat worried as well. 11/30/18 on evaluation today patient's toe ulcer actually appears to be doing fairly well. The good news is she will be getting her brace in the shoes next week on Wednesday. Hopefully we will be able to get this to heal without having to go back in the cast however she may need the cast in order to get the wound completely heal and then go from there. Fortunately there's no signs of infection at this time. 12/07/18 on evaluation today patient fortunately did receive her brace and she states she could tell this definitely makes her walk better. With that being said she's been having issues with her toe where she noticed yesterday there was a lot of tissue that was loosing off this appears to be much larger than what it was previous. She also states that her leg has been read putting much across the top of her foot just about the ankle although this seems to be receiving somewhat. The total area is still red and appears to be someone infected as best I can tell. She is previously taken Bactrim and that may be a good option for AYVAH, CAROLL A ([id number]) [id number] Page 16 of 25 her today as well. We are gonna see what I wound culture shows as well and I think that this is definitely appropriate. With that being said outside of the culture I still need to initiate something in the interim and that's what I'm gonna go ahead and select Bactrim is a good option for her. 12/14/18 on evaluation today patient appears to be doing better in regard to her left great toe ulcer as compared to last week's evaluation. There's still some erythema although this is significantly improved which is excellent news. Overall I do believe that she is making good progress is still gonna take some time before she is where I would like her to be from the standpoint of being able to place her back into the total contact cast. Hopefully we will be where we need  to be by next week. 12/21/18 on evaluation today patient actually appears to be doing poorly in regard to her toe ulcer. She's been tolerating the dressing changes without complication. Fortunately there's no signs of systemic infection although she does have a lot of drainage from the toe ulcer and this does seem to be causing some issues at this point. She does have erythema on the distal portion of her toe that appears to be likely cellulitis. 12/28/18 on evaluation today patient actually appears to be doing a little better in my pinion in regard to her toe ulcer. With that being said she still does have some evidence of infection at this time and for her culture she had both E. coli as well as enterococcus as organisms noted on evaluation. For that reason I think that though the Keflex likely has treated the E. coli rather well this has really done nothing for the enterococcus. We are going to have to initiate treatment for this specifically. 01/04/19 on evaluation today patient's toe actually appears to  be doing better from the standpoint of infection. She currently would like to see about putting the cash back on I think that this is appropriate as long as she takes care of it and keeps it from getting wet. She is gonna have some drainage we can definitely pass this up with Drawtex and alginate to try to prevent as much drainage as possible from causing the problems. With that being said I do want to at least try her with the cast between now and Tuesday. If there any issues we can't continue to use it then I will discontinue the use of the cast at that point. 01/08/19 on evaluation today patient actually appears to be doing very well as far as her foot ulcer specifically the great toe on the left is concerned. She did have an area of rubbing on the medial aspect of her left ankle which again is from the cast. Fortunately there's no signs of infection at this point in this appears to be a very slight  skin breakdown. The patient tells me she felt it rubbing but didn't think it was that bad. Fortunately there is no signs of active infection at this time which is good news. No fevers, chills, nausea, or vomiting noted at this time. 01/15/19 on evaluation today patient actually appears to be doing well in regard to her toe ulcer. Again as previous she seems to do well and she has the cast on which indicates to me that during the time she doesn't have a cast on she's putting way too much pressure on this region. Obviously I think that's gonna be an issue as with the current national emergency concerning the Covid-19 Virus it has been recommended that we discontinue the use of total contact casting by the chief medical officer of our company, Dr. Maurine Minister. The reasoning is that if a patient becomes sick and cannot come into have the cast removed they could not just leave this on for an additional two weeks. Obviously the hospitals also do not want to receive patient's who are sick into the emergency department to potentially contaminate the region and spread the Covid-19 Virus among other sick individuals within the hospital system. Therefore at this point we are suspending the use of total contact cast until the current emergency subsides. This was all discussed with the patient today as well. 01/22/19 on evaluation today patient's wound on her left great toe appears to be doing slightly worse than previously noted last week. She tells me that she has been on this quite a bit in fact she tells me she's been awake for 38 straight hours. This is due to the fact that she's having to care for grandparents because nobody else will. She has been taking care of them for five the last seven days since I've seen her they both have dementia his is from a stroke and her grandmother's was progressive. Nonetheless she states even her mom who knows her condition and situation has only help two of those days to take care  of them she's been taking care of the rest. Fortunately there does not appear to be any signs of active infection in regard to her toe at this point although obviously it doesn't look as good as it did previous. I think this is directly related to her not taking off the pressure and friction by way of taking things easy. Though I completely understand what's going on. 01/29/19 on evaluation today patient's tools are actually appears to  be showing some signs of improvement today compared to last week's evaluation as far as not necessarily the overall size of the wound but the fact that she has some new skin growth in between the two ends of the wound opening. Overall I feel like she has done well she states that she had a family member give her what sounds to be a CAM walker boot which has been helpful as well. 02/05/19 on evaluation today patient's wound bed actually appears to be doing significantly better in regard to her overall appearance of the size of the wound. With that being said she is still having an issue with offloading efficiently enough to get this to close. Apparently there is some signs of infection at this point as well unfortunately. Previously she's done well of Augmentin I really do not see anything that needs to be culture currently but there theme and cellulitis of the foot that I'm seeing I'm gonna go ahead and place her on an antibiotic today to try to help clear this up. 02/12/2019 on evaluation today patient actually appears to be doing poorly in regard to her overall wound status. She tells me she has been using her offloading shoe but actually comes in today wearing her tennis shoe with the AFO brace. Again as I previously discussed with her this is really not sufficient to allow the area to heal appropriately. Nonetheless she continues to be somewhat noncompliant and I do wonder based on what she has told my nurse in the past as to whether or not a good portion of this  noncompliance may be recreational drug and alcohol related. She has had a history of heroin overdose and this was fairly recently in the past couple of months that have been seeing her. Nonetheless overall I feel like her wound looks significantly worse today compared to what it was previous. She still has significant erythema despite the Augmentin I am not sure that this is an appropriate medication for her infection I am also concerned that the infection may have gone down into her bone. 02/19/19 on evaluation today patient actually appears to be doing about the same in regard to her toe ulcer. Unfortunately she continues to show signs of bone exposure and infection at this point. There does not appear to be any evidence of worsening of the infection but I'm also not really sure that it's getting significantly better. She is on the Augmentin which should be sufficient for the Staphylococcus aureus infection that she has at this point. With that being said she may need IV antibiotics to more appropriately treat this. We did have a discussion today about hyperbaric option therapy. 02/28/19 on evaluation today patient actually appears to be doing much worse in regard to the wound on her left great toe as compared to even my previous evaluation last week. Unfortunately this seems to be training in a pretty poor direction. Her toe was actually now starting to angle laterally and I can actually see the entire joint area of the proximal portion of the digit where is the distal portion of the digit again is no longer even in contact with the joint line. Unfortunately there's a lot more necrotic tissue around the edge and the toe appears to be showing signs of becoming gangrenous in my pinion. I'm very concerned about were things stand at this point. She did see infectious disease and they are planning to send in a prescription for Sivextro for her and apparently this has been  approved. With that being said I  don't think she should avoid taking this but at the same time I'm not sure that it's gonna be sufficient to save her toe at this point. 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 03/05/19 on evaluation today patient actually appears to be doing quite well in regard to her toe all things considering. She still has bone exposed but there appears to be much less your thing on overall the appearance of the wound and the toe itself is dramatically improved. She still does have some issues currently obviously with infection she did see vascular as well and there concerned that her blood flow to the toad. For that reason they are setting up for an angiogram next week. 03/14/19 on evaluation today patient appears to be doing very poor in regard to her toe and specifically in regard to the ulceration and the fact that she's starting to notice the toe was leaning even more towards the lateral aspect and the complete joint is visible on the proximal aspect of the joint. Nonetheless she's also noted a significant odor and the tip of the toe is turning more dark and necrotic appearing. Overall I think she is getting worse not better as far as this is concerned. For that reason I am recommending at this point that she likely needs to be seen for likely amputation. READMISSION 03/19/2021 This is a patient that we cared for in this clinic for a prolonged period of time in 2019 and 2020 with a left foot and left first toe wound. 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 Excell Seltzer. In December 2021 she had an ulcer on her right great toe as well as the fourth and fifth toes. She underwent a partial ray  amputation of the right fourth and fifth toes. She also had an angiogram at that time and underwent angioplasty of the right anterior tibial artery. In any case she claims that the wound on the right foot is closed I did not look at this today which was probably an oversight although I think that should be done next week. After her surgery she developed a LAIKLYN, PILKENTON A ([id number]) [id number] Page 17 of 25 dehiscence but I do not see any follow-up of this. According to Dr. Bernette Redbird last review that she was out of the area being cared for by another physician but recently came back to his attention. 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 A culture of this area showed E. coli apparently before she came back to see Dr. Excell Seltzer she was supposed to be receiving antibiotics but she did not really take them. Nor is she offloading this area at all. Finally her last hemoglobin A1c listed in epic was in March 2022 at 14.1 she says things are a lot better since then although I am not sure. 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 She had this wound at the time. During this admission she had E. coli sepsis a TEE was negative. Past medical history is extensive and includes type 2 diabetes with peripheral neuropathy cardiomyopathy with  an ejection fraction of 33%, hypertension, hyperlipidemia chronic renal failure stage III history of substance abuse with cocaine although she claims to be clean now verified by her mother. She is still a heavy cigarette smoker. She has a history of bipolar disorder seizure disorder ABI in our clinic was 1.05 6/1; left midfoot in the setting of a TMA done previously. Round circular wound with a "knuckle" of protruding tissue. The problem is that the knuckle was not attached to any of the surrounding granulation and this probed proximally widely I  removed a large portion of this tissue. This wound goes with considerable undermining laterally. I do not feel any bone there was no purulence but this is a deep wound. 6/8; in spite of the debridement I did last week. She arrives with a wound looking exactly the same. A protruding "knuckle" of tissue nonadherent to most of the surrounding tissue. There is considerable depth around this from 6-12 o'clock at 2.7 cm and undermining of 1 cm. This does not look overtly infected and the x- ray I did last week was negative for any osseous abnormalities. We have been using silver collagen 6/15; deep tissue culture I did last week showed moderate staph aureus and moderate Pseudomonas. This will definitely require prolonged antibiotic therapy. The pathology on the protuberant area was negative for malignancy fungus etc. 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 We have been using silver collagen. I am going to be prescribing Levaquin for 2 weeks. Her CT scan of the foot is down for 7/5 6/22; CT scan of the foot on 7 5. She says she has hardware in the left leg from her previous fracture. She is on the Levaquin for the deep tissue culture I did that showed methicillin sensitive staph aureus and Pseudomonas. I gave her a 2-week supply and she will have another week. She arrives in clinic today with the same protuberant tissue however this is nonadherent to the tissue surrounding it. I am really at a loss to explain this unless there is underlying deep tissue infection 6/29; patient presents for 1 week follow-up. She has been using collagen to the wound bed. She reports taking her antibiotics as prescribed.She has no complaints or issues today. She denies signs of infection. 7/6; patient presents for one week followup. She has been using collagen to the wound bed. She states she is taking Levaquin however at times she is not able to keep it down. She  denies signs of infection. 7/13; patient presents for 1 week follow-up. She has been using silver alginate to the wound bed. She still has nausea when taking her antibiotics. She denies signs of infection. 7/20; patient presents for 1 week follow-up. She has been using silver alginate with gentamicin cream to the wound bed. She denies any issues and has no complaints today. She denies signs of infection. 7/27; patient presents for 1 week follow-up. She continues to use silver alginate with gentamicin cream to the wound bed. She reports starting her antibiotics. She has no issues or complaints. Overall she reports stability to the wound. 8/3; patient presents for 1 week follow-up. She has been using silver alginate with gentamicin cream to the wound bed. She reports completing all antibiotics. She has no issues or complaints today. She denies signs of infection. 8/17; patient presents for 2-week follow-up. He is to use silver alginate to the wound bed. She has no issues or complaints today. She denies signs of infection.  She reports her pain has improved in her foot since last clinic visit 8/24; patient presents for 1 week follow-up. She continues to use silver alginate to the wound bed. She has no issues or complaints. She denies signs of infection. Pain is stable. 9/7; patient presents for follow-up. She missed her last week appointment due to feeling ill. She continues to use silver alginate. She has a new wound to the right lower extremity that is covered in eschar. She states It occurred over the past week and has no idea how it started. She currently denies signs of infection. 9/14; patient presents for follow-up. T the left foot wound she has been using gentamicin cream and silver alginate. T the right lower extremity wound she has o o been keeping this covered and has not obtain Santyl. 9/21; patient presents for follow-up. She reports using gentamicin cream and silver alginate to the left  foot and Santyl to the right lower extremity wound. She has no issues or complaints today. She denies signs of infection. 9/28; patient presents for follow-up. She reports a new wound to her right heel. She states this occurred a few days ago and is progressively gotten worse. She has been trying to clean the area with a Q-tip and Santyl. She reports stability in the other 2 wounds. She has been using gentamicin cream and silver alginate to the left foot and Santyl to the right lower extremity wound. 10/12; patient presents for follow-up. She reports improvement to the wound beds. She is seeing vein and vascular to discuss the potential of a left BKA. She states they are going to do an arteriogram. She continues to use silver alginate with dressing changes to her wounds. 11/2; patient presents for follow-up. She states she has not been doing dressing changes to the wound beds. She states she is not able to offload the areas. She reports chronic pain to her left foot wound. 11/9; patient presents for follow-up. She came in with only socks on. She states she forgot to put on shoes. It is unclear if she is doing any dressing changes. She currently denies systemic signs of infection. 11/16; patient presents for follow-up. She came again only with socks on. She states she does not wear shoes ever. It is unclear if she does dressing changes. She currently denies systemic signs of infection. 11/23; patient presents for follow-up. She wore her shoes today. It still unclear exactly what dressing she is using for each wound but she did states she obtained Dakin's solution and has been using this to the left foot wound. She currently denies signs of infection. 11/30; patient presents for follow-up. She has no issues or complaints today. She currently denies signs of infection. 12/7; patient presents for follow-up. She has no issues or complaints today. She has been using Hydrofera Blue to the right heel wound  and Dakin solution to the left foot wound. Her right anterior leg wound is healed. She currently denies signs of infection. 12/14; patient presents for follow-up. She has been using Hydrofera Blue to the right heel and Dakin's to the left foot wounds. She has no issues or complaints today. She denies signs of infection. 12/21; patient presents for follow-up. She reports using Hydrofera Blue to the right heel and Dakin's to the left foot wound. She denies signs of infection. 12/28; patient presents for follow-up. She continues to use Dakin's to the left foot wound and Hydrofera Blue to the right heel wound. She denies signs of infection. 1/4;  patient presents for follow-up. She has no issues or complaints today. She denies signs of infection. 1/11; patient presents for follow-up. It is unclear if she has been dressing these wounds over the past week. She currently denies signs of infection. 1/18; patient presents for follow-up. She states she has been using Dakin's wet-to-dry dressings to the left foot. She has been using Hydrofera Blue to the right foot foot wound. She states that the anterior right leg wound has reopened and draining serous fluid. She denies signs of infection. 1/25; patient presents for follow-up. She has no issues or complaints today. REIANA, POTEET A ([id number]) [id number] Page 18 of 25 2/1; patient presents for follow-up. She has no issues or complaints today. She denies signs of infection. 2/8; patient presents for follow-up. She has lost her surgical shoes. She did not have a dressing to the right heel wound. She currently denies signs of infection. 2/15; patient presents for follow-up. She reports more pain to the right heel today. She denies purulent drainage Or fever/chills 2/22; patient presents for follow-up. She reports taking clindamycin over the past week. She states that she continues to have pain to her right heel. She reports purulent  drainage. Readmission 03/16/2022 Ms. Iveth Heidemann is a 47 year old female with a past medical history of type 2 diabetes, osteomyelitis to her feet, chronic systolic heart failure and bipolar disorder that presents to the clinic for bilateral feet wounds and right lower extremity wound. She was last seen in our clinic on 12/15/2021. At that time she had purulent drainage coming out of her right plantar foot and I recommended she go to the ED. She states she went to [facility] and has been there for the past 3 months. I cannot see the records. She states she had OR debridement and was on several weeks of IV antibiotics while inpatient. Since discharge she has not been taking care of the wound beds. She had nothing on her feet other than socks today. She currently denies signs of infection. 5/31; patient presents for follow-up. She has been using Dakin's wet-to-dry dressings to the wound beds on her feet bilaterally and antibiotic ointment to the right anterior leg wound. She had a wound culture done at last clinic visit that showed moderate Pseudomonas aeruginosa sensitive to ciprofloxacin. She currently denies systemic signs of infection. 6/14; patient presents for follow-up. She received Keystone 5 days ago and has been using this on the wound beds. She states that last week she had to go to the hospital because she had increased warmth and erythema to the right foot. She was started on 2 oral antibiotics. She states she has been taking these. She currently denies systemic signs of infection. She has no issues or complaints today. 6/21; patient presents for follow-up. She states she has been using Keystone antibiotics to the wound beds. She has no issues or complaints today. She denies signs of infection. 6/28; patient presents for follow-up. She has been using Keystone antibiotics to the wound beds. She has no issues or complaints today. 7/12; patient presents for follow-up. Has been  using Keystone antibiotics to the wound beds with calcium alginate. She has no issues or complaints today. She never followed up with her orthopedic surgeon who did the OR debridement to the right foot. We discussed the total contact cast for the left foot and patient would like to do this next week. 7/19; patient presents for follow-up. She has been using Keystone antibiotics with calcium alginate to the wound  beds. She has no issues or complaints today. Patient is in agreement to do the total contact cast of the left foot today. She knows to return later this week for the obligatory cast change. 05-13-2022 upon evaluation today patient's wound which she has the cast of the left leg actually appears to be doing significantly better. Fortunately I do not see any signs of active infection locally or systemically which is great news and overall I am extremely pleased with where we stand currently. 7/26; patient presents for follow-up. She has a cast in place for the past week. She states it irritated her shin. Other than that she tolerated the cast well. She states she would like a break for 1 week from the cast. We have been using Keystone antibiotic and Aquacel to both wound beds. She denies signs of infection. 8/2; patient presents for follow-up. She has been using Keystone and Aquacel to the wound beds. She denies any issues and has no complaints. She is agreeable to have the cast placed today for the left leg. 06-03-2022 upon evaluation today patient appears to be doing well with regard to her wound she saw some signs of improvement which is great news. Fortunately I do not see any evidence of active infection locally or systemically at this time which is great news. No fevers, chills, nausea, vomiting, or diarrhea. 8/16; patient presents for follow-up. She has no issues or complaints today. We have been using Keystone and Aquacel to the wound beds. The left lower extremity is in a total contact  cast. She is tolerated this well. 8/23; patient presents for follow-up. She has had the total contact cast on the left leg for the past week. Unfortunately this has rubbed and broken down the skin to the medial foot. She currently denies signs of infection. She has been using Keystone antibiotic to the right plantar foot wound. 8/30; patient presents for follow-up. We have held off on the total contact cast for the left leg for the past week. Her wound on the left foot has improved and the previous surrounding breakdown of skin has epithelialized. She has been using Keystone antibiotic to both wound beds. She has no issues or complaints today. She denies signs of infection. 9/6; patient presents for follow-up. She has ordered her's Keystone antibiotic and this is arriving this week. She has been doing Dakin's wet-to-dry dressings to the wound beds. She denies signs of infection. She is agreeable to the total contact cast today. 9/13; patient presents for follow-up. She states that the cast caused her left leg shin to be sore. She would like to take a break from the cast this week. She has been using Keystone antibiotic to the right plantar foot wound. She denies signs of infection. 9/20; patient presents for follow-up. She has been using Keystone antibiotics to the wound beds with calcium alginate to the right foot wound and Hydrofera Blue to the left foot wound. She is agreeable to having the cast placed today. She has been approved for Apligraf and we will order this for next clinic visit. 9/27; patient presents for follow-up. We have been using Keystone antibiotic with Hydrofera Blue to the left foot wound under a total contact cast. T the right o foot wound she has been using Keystone antibiotic and calcium alginate. She declines a total contact cast today. Apligraf is available for placement and she would like to proceed with this. 07-28-2022 upon evaluation today patient appears to be doing well  currently in  regard to her wound. She is actually showing signs of significant improvement which is great news. Fortunately I do not see any evidence of active infection locally nor systemically at this time. She has been seeing Dr. Mikey Bussing and to be honest has been doing very well with the cast. Subsequently she comes in today with a cast on and we did reapply that today as well. She did not really want to she try to talk me out of that but I explained that if she wanted to heal this is really the right way to go. Patient voiced understanding. In regard to her right foot this is actually a lot better compared to the last time I saw her which is also great news. 10/11; patient presents for follow-up. Apligraf and the total contact cast was placed to the left leg at last clinic visit. She states that her right foot wound had burning pain to it with the placement of Apligraf to this area. She has been doing Spurgeon over this area. She denies signs of infection including increased warmth, erythema or purulent drainage. 11/1; 3-week follow-up. The patient fortunately did not have a total contact cast or an Apligraf and on the left foot. She has been using Keystone ABD pads and kerlix and her own running shoes She arrives in clinic today with thick callus and a very poor surface on the left foot on the right nonviable skin subcutaneous tissue and a deep probing hole. 11/15; patient missed her last clinic appointment. She states she has not been dressing the wound beds for the past 2 weeks. She states that at she had a new roommate but is now going back to live with her mother. Apparently its been a distracting 2 weeks. Patient currently denies signs of infection. TONAE, LIVOLSI A ([id number]) [id number] Page 19 of 25 11/22; patient presents for follow-up. She states she has been using Keystone antibiotic and Dakin's wet-to-dry dressings to the wound beds. She is agreeable for  cast placement today. We had ordered Apligraf however this has not been received by our facility. 11/29; Patient had a total contact cast placed at last clinic visit and she tolerated this well. We were using silver alginate under the cast. Patient's been using Keystone antibiotic with Aquacel to the right plantar foot wound. She has no issues or complaints today. Apligraf is available for placement today. Patient would like to proceed with this. 12/6; patient presents for follow-up. She had Apligraf placed in standard fashion last clinic visit under the total contact cast to the left lower extremity. She has been using Keystone antibiotic and Aquacel to the right plantar foot wound. She has no issues or complaints today. 12/13; patient presents for follow-up. She has finished 5 Apligraf placements. Was told she would not qualify for more. We have been doing a total contact cast to the left lower extremity. She has been using Keystone antibiotic and Aquacel to the right plantar foot wound. She has no issues or complaints today. 12/20; patient presents for follow-up. We have been using Hydrofera Blue with Keystone antibiotic under a total contact cast of the left lower extremity. She reports using Keystone antibiotic and silver alginate to the right heel wound. She has no issues or complaints today. 12/27; patient presents with a healthy wound on the left midfoot. We have Apligraf to apply that to that more also using a total contact cast. On the right we are using Keystone and silver alginate. She is offloading the right heel with  a surgical shoealthough by her admission she is on her feet quite a bit 1/3; patient presents for follow-up. Apligraf was placed to the wound beds last clinic visit. She was placed in a total contact cast to the left lower extremity. She declines a total contact cast today. She states that her mother is in the hospital and she cannot adequately get around with the cast  on. 1/10; patient presents for follow-up. She declined the total contact cast at last clinic visit. Both wounds have declined in appearance. She states that she has been on her feet and not offloading the wound beds. She currently denies signs of infection. 1/17; patient presents for follow-up. She had the total contact cast along with Apligraf placed last week to the left lower extremity. She tolerated this well. She has been using Aquacel Ag and Keystone antibiotic to the right heel wound. She currently denies signs of infection. She has no issues or complaints today. 1/24; patient presents for follow-up. We have been using Apligraf to the left foot wound along with a total contact cast. She has done well with this. T the right o heel wound she has been using Aquacel Ag and Keystone antibiotic ointment. She has no issues or complaints today. She denies signs of infection. 1/31; patient presents for follow-up. We have been using Apligraf to the left foot wound along with the total contact cast. She continues to do well with this. To the right heel we have been using Aquacel Ag and Keystone antibiotic ointment. She has no issues or complaints today. 12-01-2022 upon evaluation patient is seen today on my schedule due to the fact that she unfortunately was in the hospital yesterday. Her cast needed to come off the only reason she is out of the hospital is due to the fact that they would not take it off at the hospital which is somewhat bewildered reading to me to be perfectly honest. I am not certain why this was but either way she was released and then was placed on my schedule today in order to get this off and reapply the total contact casting as appropriate. I do not have an Apligraf for her today it was applied last week and today's actually expired yesterday as there was some scheduling conflicts with her being in the hospital. Nonetheless we do not have that for reapplication today but the good news  that she is not draining too much and the Apligraf can go for up to 2 weeks so I am going to go ahead and reapply the total contact casting but we are going to leave the Apligraf in place. 2/14; patient presents for follow-up. T the left leg she has had the total contact cast and Apligraf for the past week. She has had no issues with this. T the o o right heel she has been using antibiotic ointment and Aquacel Ag. 2/21; patient presents for follow-up. We have been using Apligraf and a total contact cast to the left lower extremity. She is tolerated this well. Unfortunately she is not approved for any more Apligraf per insurance. She has been using antibiotic ointment and Aquacel Ag to the right foot. She has no issues or complaints today. 2/28; patient presents for follow-up. We have been using Hydrofera Blue and antibiotic ointment under the total contact cast to the left lower extremity. He has been using Aquacel Ag with antibiotic ointment to the right plantar foot. She has no issues or complaints today. 3/6; patient presents for  follow-up. She did not obtain her gentamicin ointment. She has been using Aquacel Ag to the right plantar foot wound. We have been using Hydrofera Blue with antibiotic ointment under the total contact cast to the left lower extremity. She has no issues or complaints today. 3/12; patient presents for follow-up. She has been using gentamicin ointment and Aquacel Ag to the right plantar foot wound. We have been using Hydrofera Blue with antibiotic ointment under the total contact cast on the left lower extremity foot wound. She has no issues or complaints today. 3/20; patient presents for follow-up. She has been using gentamicin ointment and Aquacel Ag to the right plantar foot wound. We have been using Hydrofera Blue with antibiotic ointment under the total contact cast to the left lower extremity wound. She has no issues or complaints today. 3/27; patient presents for  follow-up. We have been using antibiotic ointment and Aquacel Ag to the right plantar foot wound. We have been using Hydrofera Blue with antibiotic ointment under the total contact cast to the left lower extremity. Both wounds are smaller. 01-24-2023 upon evaluation today patient actually appears to be doing better compared to last time I saw her. It has been several weeks since I last saw her but nonetheless I think the total contact casting is doing a good job. Fortunately I do not see any evidence of infection or worsening overall which is great news and in general I do believe that she is moving in the appropriate direction. It has been a very long road but nonetheless I feel like she is nearing the end at least in regard to the left foot and even the right foot looks much better to be perfectly honest. 01-31-2023 upon evaluation today patient appears to be doing well currently in regard to her wounds. She has been require some sharp debridement which I think is probably bone both sides to remove a lot of callus that has built up at this point. I discussed that with her today. Also think total contact cast is doing well for the left foot Emina continue that as well. 4/17; patient presents for follow-up. We have been doing Hydrofera Blue with antibiotic ointment under the total contact cast to the left lower extremity and Aquacel Ag with antibiotic ointment to the right foot wound. She has been using her offloading heel shoe here. 4/24; patient presents for follow-up. We have been doing Hydrofera Blue with antibiotic ointment under the total contact cast to the left lower extremity and Aquacel Ag with antibiotic ointment to the right foot wound. She has been using her offloading heel shoe here. Wounds are smaller with slough accumulation. 5/1; patient presents for follow-up. We have been doing Hydrofera Blue with antibiotic ointment under the total contact cast to the left lower extremity and Aquacel Ag  with antibiotic ointment to the right foot wound. Left foot wound is stable and right foot wound appears smaller. 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 On the left she is using Hydrofera Blue topical antibiotic under a total contact cast, on the right plantar heel gentamicin Aquacel Ag. According to our intake nurse both are making slow but steady improvements. 5/15; patient presents for follow-up. We have been doing Hydrofera Blue with antibiotic ointment under the total contact cast on the left and antibiotic ointment with Aquacel Ag on the right. Unfortunately the left foot wound has declined in size and appearance. We also do not have the total contact cast available in office today. Patient denies  signs of infection. 5/22; patient presents for follow-up. We have been doing Hydrofera Blue and antibiotic ointment to the left foot and Aquacel Ag with antibiotic ointment to the right foot. We took a break from the cast since we did not have it placed at last week. Wounds look stable if not slightly improved. 03-27-2023 patient presents today she has had this For Most 2 Weeks Because She Was in the Hospital and They Did Not T It off. Again That Is Really Not JODIE, CAVEY A ([id number]) [id number] Page 20 of 25 Something That She Could Control but Nonetheless We Are Seeing Her at This Point Today for Reevaluation. We Do Want to Go and See about Reapply the Cast. Again We Had All Set Everything up to Go Ahead and Do This When We Went to Get the Cast Only to Realize That Not Had Come in and We Were out at the Moment. With That Being [place] Spaniel We Will Get Have T Get the Exam before We Can Reapply the T Contact Casting Going Forward. o otal 6/12; patient presents for follow-up. She has been using Hydrofera Blue to the left foot wound and Aquacel Ag to the right foot wound. No cast was available last week placed on the patient so she has been offloading with her surgical  shoe. She has no issues or complaints today. 6/19; patient presents for follow-up. We have been using Hydrofera Blue to the left foot wound under the total contact cast and Aquacel Ag to the right wound. She has no issues or complaints today. The wounds are smaller. 6/26; patient presents for follow-up. We have been using Hydrofera Blue to the left foot wound under the total contact cast and Aquacel Ag to the right foot wound. Wounds are smaller. Patient has no issues or complaints today. 7/3; patient presents for follow-up. We have been using Hydrofera Blue to the left foot wound under the total contact cast and Aquacel Ag to the right foot wound. Wounds Appear smaller. No signs of infection. 7/10; patient presents for follow-up. We have been using Hydrofera Blue to the left foot wound under the total contact cast and Aquacel Ag to the right foot wound. Left foot wound is smaller. Right foot wound is stable. 7/17; patient presents for follow-up. We have been using Hydrofera Blue to the left foot under the total contact cast and Aquacel Ag to the right foot wound. She has no issues or complaints today. 7/24; patient presents for follow-up. We have been using Hydrofera Blue to the left foot under the total contact cast and Aquacel Ag to the right foot wound. Unfortunately she was bit by her dog over the weekend and developed a wound to the right lower leg. She has been keeping the area covered. 7/31; patient presents for follow-up. We have been using Hydrofera Blue to the left foot under the total contact cast and Aquacel Ag to the right foot wound and mupirocin ointment to the right anterior leg wound. She started her oral antibiotics 2 days ago. Objective Constitutional Vitals Time Taken: 11:16 AM, Height: 69 in, Weight: 178 lbs, BMI: 26.3, Temperature: 98.5 F, Pulse: 49 bpm, Respiratory Rate: 16 breaths/min, Blood Pressure: 123/73 mmHg. General Notes: Right foot: T the plantar heel there is an  incision site with increased depth, Granulation tissue and slough at the opening. Left foot: T the o o medial aspect there is an open wound with granulation tissue, Callus and slough build up. No signs of infection to the  feet wounds. Right lower extremity: T the o anterior aspect there is an open wound with nonviable tissue and granulation tissue. No signs of infection. Integumentary (Hair, Skin) Wound #11 status is Open. Original cause of wound was Surgical Injury. The date acquired was: 12/01/2021. The wound has been in treatment 62 weeks. The wound is located on the Right,Plantar Foot. The wound measures 0.4cm length x 0.4cm width x 0.5cm depth; 0.126cm^2 area and 0.063cm^3 volume. There is Fat Layer (Subcutaneous Tissue) exposed. There is a small amount of serous drainage noted. The wound margin is distinct with the outline attached to the wound base. There is small (1-33%) pink, pale granulation within the wound bed. There is a small (1-33%) amount of necrotic tissue within the wound bed including Adherent Slough. Wound #12 status is Open. Original cause of wound was Pressure Injury. The date acquired was: 03/16/2020. The wound has been in treatment 62 weeks. The wound is located on the Left,Medial,Plantar Foot. The wound measures 0.4cm length x 0.3cm width x 0.2cm depth; 0.094cm^2 area and 0.019cm^3 volume. There is Fat Layer (Subcutaneous Tissue) exposed. There is a medium amount of serosanguineous drainage noted. The wound margin is flat and intact. There is large (67-100%) pink, pale granulation within the wound bed. There is no necrotic tissue within the wound bed. Wound #14 status is Open. Original cause of wound was Trauma. The date acquired was: 05/14/2023. The wound has been in treatment 1 weeks. The wound is located on the Right,Anterior Lower Leg. The wound measures 1.5cm length x 0.7cm width x 0.1cm depth; 0.825cm^2 area and 0.082cm^3 volume. There is Fat Layer (Subcutaneous Tissue)  exposed. There is a small amount of serosanguineous drainage noted. The wound margin is distinct with the outline attached to the wound base. There is no granulation within the wound bed. There is a large (67-100%) amount of necrotic tissue within the wound bed including Adherent Slough. 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 I debrided nonviable tissue. I recommended continuing with Hydrofera Blue under the total contact cast to the left leg. JAMONICA, SCHOFF A ([id number]) [id number] Page 21 of 25 Will readd antibiotic ointment to the wound bed here. Continue Aquacel Ag to the right foot wound and mupirocin ointment to the right lower extremity wound. Complete Augmentin course for dog bite. No signs of infection to any of the wound beds. Follow-up in 1 week. Procedures Wound #11 Pre-procedure diagnosis of Wound #11 is an Open Surgical Wound located on the Right,Plantar Foot . There was a Selective/Open Wound Non-Viable Tissue Debridement with a total area of 0.13 sq cm performed by [name] Corwin, MD. With the following instrument(s): Curette to remove Viable and Non-Viable tissue/material. Material removed includes Slough. A time out was conducted at 11:54, prior to the start of the procedure. A Minimum amount of bleeding was controlled with Pressure. The procedure was tolerated well. Post Debridement Measurements:  0.4cm length x 0.4cm width x 0.5cm depth; 0.063cm^3 volume. Character of Wound/Ulcer Post Debridement is stable. 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 There was a Selective/Open Wound Non-Viable Tissue Debridement with a total area of 0.09 sq cm performed by [name] Corwin, MD. With the following instrument(s): Curette to remove Viable and Non-Viable tissue/material. Material removed includes Callus and Slough and. A time out was conducted at 11:49, prior to the start of the procedure. A Minimum amount of bleeding was controlled with Pressure. The procedure was tolerated well. Post Debridement Measurements: 0.4cm length x 0.3cm width x 0.2cm depth; 0.019cm^3 volume. Character of Wound/Ulcer Post Debridement is stable. Severity of Tissue Post Debridement is: Fat layer exposed. 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 There was a T Contact otal Cast Procedure by [name] Corwin, MD. Post procedure Diagnosis Wound #12: Same as Pre-Procedure Wound #14 Pre-procedure diagnosis of Wound #14 is a T be determined located on the Right,Anterior Lower Leg . There was a Selective/Open Wound Non-Viable Tissue o Debridement with a total area of 0.82 sq cm performed by [name] Corwin, MD. With the following instrument(s): Curette to remove Viable and Non-Viable tissue/material. Material removed includes Slough. A time out was conducted at 11:55, prior to the start of the procedure. A Minimum amount of bleeding was controlled with Pressure. The procedure was tolerated well. Post Debridement Measurements: 1.5cm length x 0.7cm width x 0.1cm depth; 0.082cm^3  volume. Character of Wound/Ulcer Post Debridement is stable. Post procedure Diagnosis Wound #14: Same as Pre-Procedure Plan Follow-up Appointments: Return Appointment in 1 week. Bathing/ Shower/ Hygiene: May shower with wound dressing protected with water repellent cover or cast protector. No tub bath. Anesthetic (Use 'Patient Medications' Section for Anesthetic Order Entry): Lidocaine applied to wound bed Off-Loading: T Contact Cast to Left Lower Extremity otal Open toe surgical shoe - right WOUND #11: - Foot Wound Laterality: Plantar, Right Cleanser: Soap and Water 3 x Per Week/30 Days Discharge Instructions: Gently cleanse wound with antibacterial soap, rinse and pat dry prior to dressing wounds Cleanser: Wound Cleanser 3 x Per Week/30 Days Discharge Instructions: Wash your hands with soap and water. Remove old dressing, discard into plastic bag and place into trash. Cleanse the wound with Wound Cleanser prior to applying a clean dressing using gauze sponges, not tissues or cotton balls. Do not scrub or use excessive force. Pat dry using gauze sponges, not tissue or cotton balls. Prim Dressing: Aquacel Extra Hydrofiber Dressing, 2x2 (in/in) 3 x Per Week/30 Days ary Secondary Dressing: ABD Pad 5x9 (in/in) 3 x Per Week/30 Days Discharge Instructions: Cover with ABD pad Secondary Dressing: 3 x Per Week/30 Days Secured With: Medipore T - 86M Medipore H Soft Cloth Surgical T ape ape, 2x2 (in/yd) 3 x Per Week/30 Days Secured With: [employer] or Non-Sterile 6-ply 4.5x4 (yd/yd) 3 x Per Week/30 Days Discharge Instructions: Apply Kerlix as directed WOUND #12: - Foot Wound Laterality: Plantar, Left, Medial Cleanser: Soap and Water 1 x Per Week/30 Days Discharge Instructions: Gently cleanse wound with antibacterial soap, rinse and pat dry prior to dressing wounds Cleanser: Wound Cleanser 1 x Per Week/30 Days Discharge Instructions: Wash your hands with soap and water. Remove old  dressing, discard into plastic bag and place into trash. Cleanse the wound with Wound Cleanser prior to applying a clean dressing using gauze  sponges, not tissues or cotton balls. Do not scrub or use excessive force. Pat dry using gauze sponges, not tissue or cotton balls. Topical: Gentamicin 1 x Per Week/30 Days Discharge Instructions: Apply as directed by provider. Topical: Mupirocin Ointment 1 x Per Week/30 Days Discharge Instructions: Apply as directed by provider. Prim Dressing: Hydrofera Blue Ready Transfer Foam, 2.5x2.5 (in/in) 1 x Per Week/30 Days ary Discharge Instructions: Apply Hydrofera Blue Ready to wound bed as directed Secondary Dressing: Gauze 1 x Per Week/30 Days Discharge Instructions: As directed: dry Secondary Dressing: heel cup 1 x Per Week/30 Days Secured With: Medipore T - 28M Medipore H Soft Cloth Surgical T ape ape, 2x2 (in/yd) 1 x Per Week/30 Days Secured With: [employer] or Non-Sterile 6-ply 4.5x4 (yd/yd) 1 x Per Week/30 Days Discharge Instructions: Apply Kerlix as directed WOUND #14: - Lower Leg Wound Laterality: Right, Anterior Cleanser: Normal Saline 1 x Per Day/ SHANEECE, STOCKBURGER A ([id number]) [id number] Page 22 of 25 Discharge Instructions: Wash your hands with soap and water. Remove old dressing, discard into plastic bag and place into trash. Cleanse the wound with Normal Saline prior to applying a clean dressing using gauze sponges, not tissues or cotton balls. Do not scrub or use excessive force. Pat dry using gauze sponges, not tissue or cotton balls. Topical: Mupirocin Ointment 1 x Per Day/ Discharge Instructions: Apply as directed by provider. Secondary Dressing: (BORDER) Zetuvit Plus SILICONE BORDER Dressing 4x4 (in/in) 1 x Per Day/ Discharge Instructions: Please do not put silicone bordered dressings under wraps. Use non-bordered dressing only. 1. In office sharp debridement 2. Hydrofera Blue with antibiotic  ointment 3. T contact cast placed in standard fashion to the left lower extremity otal 4. Aquacel Ag to the right foot wound 5. Mupirocin ointment to the right lower extremity wound 6. Continue aggressive offloadingright offloading heel shoe 7. Follow-up in 1 week Electronic Signature(s) Signed: 05/24/2023 4:29:16 PM By: [name] Corwin DO Entered By: [name] [name] on 05/24/2023 12:45:11 -------------------------------------------------------------------------------- ROS/PFSH Details Patient Name: Date of Service: [name], Terralyn A. 05/24/2023 11:00 A M Medical Record Number: [id number] Patient Account Number: [id number] Date of Birth/Sex: Treating RN: Dec 23, 1975 (47 y.o. Skip Mayer Primary Care Provider: [name] Other Clinician: [name] Referring Provider: Treating Provider/Extender: Chapman Moss in Treatment: 32 Information Obtained From Patient Ear/Nose/Mouth/Throat Medical History: Positive for: Chronic sinus problems/congestion; Middle ear problems Hematologic/Lymphatic Medical History: Positive for: Anemia Respiratory Medical History: Positive for: Chronic Obstructive Pulmonary Disease (COPD) Cardiovascular Medical History: Positive for: Congestive Heart Failure - EF 33% Endocrine Medical History: Positive for: Type II Diabetes Time with diabetes: 14 years Treated with: Insulin, Oral agents Blood sugar tested every day: No Blood sugar testing results: Bedtime: 176 Maves, Ellinore A ([id number]) [id number] Page 23 of 25 Genitourinary Medical History: Positive for: End Stage Renal Disease Integumentary (Skin) Medical History: Positive for: History of pressure wounds Neurologic Medical History: Positive for: Neuropathy - feel and legs HBO Extended History Items Ear/Nose/Mouth/Throat: Ear/Nose/Mouth/Throat: Chronic sinus Middle ear problems problems/congestion Immunizations Pneumococcal  Vaccine: Received Pneumococcal Vaccination: No Implantable Devices No devices added Family and Social History Current every day smoker; Marital Status - Single; Alcohol Use: Never; Drug Use: Prior History; Caffeine Use: Daily Electronic Signature(s) Signed: 05/24/2023 4:29:16 PM By: [name] Corwin DO Signed: 05/26/2023 11:12:15 AM By: Elliot Gurney, BSN, RN, CWS, [name] RN, BSN Entered By: [name] [name] on 05/24/2023 12:47:08 -------------------------------------------------------------------------------- Total Contact Cast Details Patient Name: Date of Service: [name] A. 05/24/2023 11:00 A M Medical  Record Number: [id number] Patient Account Number: [id number] Date of Birth/Sex: Treating RN: 17-Jan-1976 (47 y.o. Cathlean Cower, [name] Primary Care Provider: [name] Other Clinician: [name] Referring Provider: Treating Provider/Extender: Chapman Moss in Treatment: 1 T Contact Cast Applied for Wound Assessment: otal Wound #12 Left,Medial,Plantar Foot Performed By: Physician [name] Corwin, MD Post Procedure Diagnosis Same as Pre-procedure Electronic Signature(s) Signed: 05/24/2023 4:29:16 PM By: [name] Corwin DO Signed: 05/24/2023 4:54:16 PM By: [name] Entered By: [name] on 05/24/2023 11:51:08 Earmon Phoenix A ([id number]) [id number] Page 24 of 25 -------------------------------------------------------------------------------- SuperBill Details Patient Name: Date of Service: PRESLEIGH, FELDSTEIN 05/24/2023 Medical Record Number: [id number] Patient Account Number: [id number] Date of Birth/Sex: Treating RN: 05/22/1976 (47 y.o. 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.621 Type 2 diabetes mellitus with foot ulcer L97.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 of skin W54.0XXA Bitten by dog, initial encounter S81.801A Unspecified open wound, right lower leg, initial encounter E11.622 Type 2 diabetes mellitus with other skin ulcer E11.42 Type 2 diabetes mellitus with diabetic polyneuropathy Z79.4 Long term (current) use of insulin Facility Procedures : CPT4 Code: [id number] Description: [phone] - DEBRIDE WOUND 1ST 20 SQ CM OR < ICD-10 Diagnosis Description 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.621 Type 2 diabetes mellitus with foot ulcer Modifier: Quantity: 1 : CPT4 Code: [id number] Description: 01093 - APPLY TOTAL CONTACT LEG CAST ICD-10 Diagnosis Description 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i E11.621 Type 2 diabetes mellitus with foot ulcer Modifier: ty Quantity: 1 Physician Procedures : CPT4 Code Description Modifier [id number] - WC PHYS LEVEL 3 - EST PT 25 ICD-10 Diagnosis Description L97.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 of skin W54.0XXA Bitten by dog, initial encounter E11.622  Type 2 diabetes mellitus with other skin ulcer Quantity: 1 : [id number] - WC PHYS DEBR WO ANESTH 20 SQ CM ICD-10 Diagnosis Description 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.621 Type 2 diabetes mellitus with foot ulcer Quantity: 1 : [id number] - WC PHYS APPLY TOTAL CONTACT CAST ICD-10 Diagnosis Description 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ity E11.621 Type 2 diabetes mellitus with foot ulcer Quantity: 1 Electronic Signature(s) Signed:  05/24/2023 4:29:16 PM By: Kerrin Champagne, Zuria A ([id number]) [id number] Page 25 of 25 Signed: 05/24/2023 4:29:16 PM By: [name] Corwin DO Entered By: [name] [name] on 05/24/2023 12:46:36

## 2023-05-26 NOTE — Progress Notes (Signed)
[name], [name] ([id number]) [id number] Page 1 of 11 Visit Report for 05/24/2023 Arrival Information Details Patient Name: Date of Service: [name], [name] 05/24/2023 11:00 [name]: [id number] Patient Account Number: [id number] Date of Birth/Sex: Treating RN: 09/23/1976 (47 y.o. [name] Primary Care : [name] Other Clinician: Betha [name] Referring : Treating /Extender: [name] in Treatment: 72 Visit Information History Since Last Visit All ordered tests and consults were completed: No Patient Arrived: Wheel Chair Added or deleted any medications: No Arrival Time: 11:13 Any new allergies or adverse reactions: No Transfer Assistance: EasyPivot Patient Lift Had [name] fall or experienced change in No Patient Identification Verified: Yes activities of daily living that may affect Secondary Verification Process Completed: Yes risk of falls: Patient Requires Transmission-Based Precautions: No Signs or symptoms of abuse/neglect since last visito No Patient Has Alerts: No Hospitalized since last visit: No Implantable device outside of the clinic excluding No cellular tissue based products placed in the center since last visit: Has Dressing in Place as Prescribed: Yes Has Footwear/Offloading in Place as Prescribed: Yes Left: T Contact Cast otal Pain Present Now: No Electronic Signature(s) Signed: 05/24/2023 4:54:16 PM By: [name] Entered By: [name] on 05/24/2023 11:14:24 -------------------------------------------------------------------------------- Clinic Level of Care Assessment Details Patient Name: Date of Service: [name], [name] 05/24/2023 11:00 [name]: [id number] Patient Account Number: [id number] Date of Birth/Sex: Treating RN: 04/23/1976 (47 y.o. [name] Primary Care : [name] Other Clinician: Betha [name] Referring : Treating /Extender: [name] in Treatment: 9 Clinic Level of Care Assessment Items TOOL 1 Quantity Score []  - 0 Use when EandM and Procedure is performed on INITIAL visit ASSESSMENTS - Nursing Assessment / Reassessment []  - 0 General Physical Exam (combine w/ comprehensive assessment (listed just below) when performed on new pt. evalsAKYRA, [name] [name] ([id number]) [id number] Page 2 of 11 []  - 0 Comprehensive Assessment (HX, ROS, Risk Assessments, Wounds Hx, etc.) ASSESSMENTS - Wound and Skin Assessment / Reassessment []  - 0 Dermatologic / Skin Assessment (not related to wound area) ASSESSMENTS - Ostomy and/or Continence Assessment and Care []  - 0 Incontinence Assessment and Management []  - 0 Ostomy Care Assessment and Management (repouching, etc.) PROCESS - Coordination of Care []  - 0 Simple Patient / Family Education for ongoing care []  - 0 Complex (extensive) Patient / Family Education for ongoing care []  - 0 Staff obtains Chiropractor, Records, T Results / Process Orders est []  - 0 Staff telephones HHA, Nursing Homes / Clarify orders / etc []  - 0 Routine Transfer to another Facility (non-emergent condition) []  - 0 Routine Hospital Admission (non-emergent condition) []  - 0 New Admissions / Manufacturing engineer / Ordering NPWT Apligraf, etc. , []  - 0 Emergency Hospital Admission (emergent condition) PROCESS - Special Needs []  - 0 Pediatric / Minor Patient Management []  - 0 Isolation Patient Management []  - 0 Hearing / Language / Visual special needs []  - 0 Assessment of Community assistance (transportation, D/C planning, etc.) []  - 0 Additional assistance / Altered mentation []  - 0 Support Surface(s) Assessment (bed, cushion, seat, etc.) INTERVENTIONS - Miscellaneous []  - 0 External ear exam []  - 0 Patient Transfer (multiple staff / Nurse, adult / Similar devices) []  -  0 Simple Staple / Suture removal (25 or less) []  - 0 Complex Staple / Suture removal (26 or more) []  - 0 Hypo/Hyperglycemic Management (do not check if billed separately) []  -  0 Ankle / Brachial Index (ABI) - do not check if billed separately Has the patient been seen at the hospital within the last three years: Yes Total Score: 0 Level Of Care: ____ Electronic Signature(s) Signed: 05/24/2023 4:54:16 PM By: [name] Entered By: [name] on 05/24/2023 11:57:22 -------------------------------------------------------------------------------- Encounter Discharge Information Details Patient Name: Date of Service: [name] 05/24/2023 11:00 [name]: [id number] Patient Account Number: [id number] Date of Birth/Sex: Treating RN: 09/04/1976 (47 y.o. [name] ([id number]) [id number] Page 3 of 11 Primary Care : [name] Other Clinician: Betha [name] Referring : Treating /Extender: [name] in Treatment: 44 Encounter Discharge Information Items Post Procedure Vitals Discharge Condition: Stable Temperature (F): 98.5 Ambulatory Status: Wheelchair Pulse (bpm): 49 Discharge Destination: Home Respiratory Rate (breaths/min): 16 Transportation: Private Auto Blood Pressure (mmHg): 123/73 Accompanied By: mother Schedule Follow-up Appointment: Yes Clinical Summary of Care: Electronic Signature(s) Signed: 05/24/2023 4:54:16 PM By: [name] Entered By: [name] on 05/24/2023 12:30:36 -------------------------------------------------------------------------------- Lower Extremity Assessment Details Patient Name: Date of Service: [name], [name] 05/24/2023 11:00 [name]: [id number] Patient Account Number: [id number] Date of Birth/Sex: Treating RN: [birth year] (47 y.o. [name] Primary Care : [name] Other Clinician: Betha [name] Referring : Treating /Extender: [name] in Treatment: 78 Electronic Signature(s) Signed: 05/24/2023 4:54:16 PM By: [name] Signed: 05/26/2023 11:12:15 AM By: [name], BSN, RN, CWS, [name] RN, BSN Entered By: [name] on 05/24/2023 11:31:21 -------------------------------------------------------------------------------- Multi Wound Chart Details Patient Name: Date of Service: [name] 05/24/2023 11:00 [name]: [id number] Patient Account Number: [id number] Date of Birth/Sex: Treating RN: 1976/09/20 (47 y.o. [name] Primary Care : [name] Other Clinician: Betha [name] Referring : Treating /Extender: [name] in Treatment: 75 Vital Signs Height(in): 69 Pulse(bpm): 49 Weight(lbs): 178 Blood Pressure(mmHg): 123/73 Body Mass Index(BMI): 26.3 Temperature(F): 98.5 Respiratory Rate(breaths/min): 16 [name], [name] ([id number]) [id number] Page 4 of 11 [11:Photos:] Right, Plantar Foot Left, Medial, Plantar Foot Right, Anterior Lower Leg Wound Location: Surgical Injury Pressure Injury Trauma Wounding Event: Open Surgical Wound Diabetic Wound/Ulcer of the Lower T be determined o Primary Etiology: Extremity Chronic sinus problems/congestion, Chronic sinus problems/congestion, Chronic sinus problems/congestion, Comorbid History: Middle ear problems, Anemia, Chronic Middle ear problems, Anemia, Chronic Middle ear problems, Anemia, Chronic Obstructive Pulmonary Disease Obstructive Pulmonary Disease Obstructive Pulmonary Disease (COPD), Congestive Heart Failure, (COPD), Congestive Heart Failure, (COPD), Congestive Heart Failure, Type II Diabetes, End Stage Renal Type II Diabetes, End Stage Renal Type II Diabetes, End Stage Renal Disease, History of pressure wounds, Disease, History  of pressure wounds, Disease, History of pressure wounds, Neuropathy Neuropathy Neuropathy 12/01/2021 03/16/2020 05/14/2023 Date Acquired: 62 62 1 Weeks of Treatment: Open Open Open Wound Status: No No No Wound Recurrence: 0.4x0.4x0.5 0.4x0.3x0.2 1.5x0.7x0.1 Measurements L x W x D (cm) 0.126 0.094 0.825 [name] (cm) : rea 0.063 0.019 0.082 Volume (cm) : 93.20% 98.00% 44.70% % Reduction in Area: 96.20% 98.00% 72.50% % Reduction in Volume: Full Thickness Without Exposed Grade 3 Full Thickness Without Exposed Classification: Support Structures Support Structures Small Medium Small Exudate Amount: Serous Serosanguineous Serosanguineous Exudate Type: amber red, brown red, brown Exudate Color: Distinct, outline attached Flat and Intact Distinct, outline attached Wound Margin: Small (1-33%) Large (67-100%) None Present (0%) Granulation Amount: Pink, Pale Pink, Pale N/[name] Granulation Quality: Small (1-33%) None Present (0%) Large (67-100%) Necrotic Amount: Fat Layer (Subcutaneous  Tissue): Yes Fat Layer (Subcutaneous Tissue): Yes Fat Layer (Subcutaneous Tissue): Yes Exposed Structures: Fascia: No Fascia: No Fascia: No Tendon: No Tendon: No Tendon: No Muscle: No Muscle: No Muscle: No Joint: No Joint: No Joint: No Bone: No Bone: No Bone: No Medium (34-66%) None None Epithelialization: Treatment Notes Electronic Signature(s) Signed: 05/24/2023 4:54:16 PM By: [name] Entered By: [name] on 05/24/2023 11:31:33 -------------------------------------------------------------------------------- Multi-Disciplinary Care Plan Details Patient Name: Date of Service: [name] 05/24/2023 11:00 [name]: [id number] Patient Account Number: [id number] Date of Birth/Sex: Treating RN: [birth year] (47 y.o. [name] Primary Care : [name] Other Clinician: Betha [name] Referring : Treating /Extender: [name] in Treatment: [address] Lalana [name] ([id number]) [id number] Page 5 of 11 Active Inactive Abuse / Safety / Falls / Self Care Management Nursing Diagnoses: History of Falls Potential for falls Potential for injury related to falls Goals: Patient will not develop complications from immobility Date Initiated: 03/17/2022 Date Inactivated: 04/20/2022 Target Resolution Date: 03/16/2022 Goal Status: Met Patient/caregiver will verbalize understanding of skin care regimen Date Initiated: 03/17/2022 Date Inactivated: 09/28/2022 Target Resolution Date: 03/16/2022 Goal Status: Met Patient/caregiver will verbalize/demonstrate measure taken to improve self care Date Initiated: 03/17/2022 Target Resolution Date: 05/24/2023 Goal Status: Active Interventions: Assess fall risk on admission and as needed Provide education on basic hygiene Provide education on personal and home safety Notes: Electronic Signature(s) Signed: 05/24/2023 4:54:16 PM By: [name] Signed: 05/26/2023 11:12:15 AM By: [name], BSN, RN, CWS, [name] RN, BSN Entered By: [name] on 05/24/2023 12:28:29 -------------------------------------------------------------------------------- Pain Assessment Details Patient Name: Date of Service: [name] 05/24/2023 11:00 [name]: [id number] Patient Account Number: [id number] Date of Birth/Sex: Treating RN: Nov 01, 1975 (47 y.o. [name] Primary Care : [name] Other Clinician: Betha [name] Referring : Treating /Extender: [name] in Treatment: 55 Active Problems Location of Pain Severity and Description of Pain Patient Has Paino No Site Locations [place], [place] [name] ([id number]) [id number] Page 6 of 11 Pain Management and Medication Current Pain Management: Electronic Signature(s) Signed: 05/24/2023 4:54:16 PM By:  [name] Signed: 05/26/2023 11:12:15 AM By: [name], BSN, RN, CWS, [name] RN, BSN Entered By: [name] on 05/24/2023 11:20:04 -------------------------------------------------------------------------------- Patient/Caregiver Education Details Patient Name: Date of Service: [name] 7/31/2024andnbsp11:00 [name]: [id number] Patient Account Number: [id number] Date of Birth/Gender: Treating RN: [birth year] (47 y.o. [name] Primary Care Physician: [name] Other Clinician: Betha [name] Referring Physician: Treating Physician/Extender: [name] in Treatment: 24 Education Assessment Education Provided To: Patient and Caregiver Education Topics Provided Wound/Skin Impairment: Handouts: Other: continue wound care as directed Methods: Explain/Verbal Responses: State content correctly Electronic Signature(s) Signed: 05/24/2023 4:54:16 PM By: [name] Entered By: [name] on 05/24/2023 12:28:59 [name] ([id number]) [id number] Page 7 of 11 -------------------------------------------------------------------------------- Wound Assessment Details Patient Name: Date of Service: [name], [name] 05/24/2023 11:00 [name]: [id number] Patient Account Number: [id number] Date of Birth/Sex: Treating RN: Jun 09, 1976 (47 y.o. [name] Primary Care : [name] Other Clinician: Betha [name] Referring : Treating /Extender: [name] Weeks in Treatment: 37 Wound Status Wound Number: 11 Primary Open Surgical Wound Etiology: Wound Location: Right, Plantar Foot Wound Open Wounding Event: Surgical Injury Status: Date Acquired: 12/01/2021 Comorbid Chronic sinus problems/congestion, Middle ear problems, Anemia, Weeks Of Treatment: 62 History: Chronic Obstructive Pulmonary Disease (COPD), Congestive  Heart Clustered  Wound: No Failure, Type II Diabetes, End Stage Renal Disease, History of pressure wounds, Neuropathy Photos Wound Measurements Length: (cm) 0.4 Width: (cm) 0.4 Depth: (cm) 0.5 Area: (cm) 0.126 Volume: (cm) 0.063 % Reduction in Area: 93.2% % Reduction in Volume: 96.2% Epithelialization: Medium (34-66%) Wound Description Classification: Full Thickness Without Exposed Suppo Wound Margin: Distinct, outline attached Exudate Amount: Small Exudate Type: Serous Exudate Color: amber rt Structures Foul Odor After Cleansing: No Slough/Fibrino Yes Wound Bed Granulation Amount: Small (1-33%) Exposed Structure Granulation Quality: Pink, Pale Fascia Exposed: No Necrotic Amount: Small (1-33%) Fat Layer (Subcutaneous Tissue) Exposed: Yes Necrotic Quality: Adherent Slough Tendon Exposed: No Muscle Exposed: No Joint Exposed: No Bone Exposed: No Treatment Notes Wound #11 (Foot) Wound Laterality: Plantar, Right Cleanser Soap and Water Discharge Instruction: Gently cleanse wound with antibacterial soap, rinse and pat dry prior to dressing wounds Weigel, Samyah [name] ([id number]) [id number] Page 8 of 11 Wound Cleanser Discharge Instruction: Wash your hands with soap and water. Remove old dressing, discard into plastic bag and place into trash. Cleanse the wound with Wound Cleanser prior to applying [name] clean dressing using gauze sponges, not tissues or cotton balls. Do not scrub or use excessive force. Pat dry using gauze sponges, not tissue or cotton balls. Heather-Wound Care Topical Primary Dressing Aquacel Extra Hydrofiber Dressing, 2x2 (in/in) Secondary Dressing ABD Pad 5x9 (in/in) Discharge Instruction: Cover with ABD pad Secured With Medipore T - 26M Medipore H Soft Cloth Surgical T ape ape, 2x2 (in/yd) Kerlix Roll Sterile or Non-Sterile 6-ply 4.5x4 (yd/yd) Discharge Instruction: Apply Kerlix as directed Compression Wrap Compression  Stockings Add-Ons Electronic Signature(s) Signed: 05/24/2023 4:54:16 PM By: [name] Signed: 05/26/2023 11:12:15 AM By: [name], BSN, RN, CWS, [name] RN, BSN Entered By: [name] on 05/24/2023 11:29:38 -------------------------------------------------------------------------------- Wound Assessment Details Patient Name: Date of Service: [name] 05/24/2023 11:00 [name]: [id number] Patient Account Number: [id number] Date of Birth/Sex: Treating RN: Aug 19, 1976 (47 y.o. [name], [name] Primary Care : [name] Other Clinician: Betha [name] Referring : Treating /Extender: [name] Weeks in Treatment: 62 Wound Status Wound Number: 12 Primary Diabetic Wound/Ulcer of the Lower Extremity Etiology: Wound Location: Left, Medial, Plantar Foot Wound Open Wounding Event: Pressure Injury Status: Date Acquired: 03/16/2020 Comorbid Chronic sinus problems/congestion, Middle ear problems, Anemia, Weeks Of Treatment: 62 History: Chronic Obstructive Pulmonary Disease (COPD), Congestive Heart Clustered Wound: No Failure, Type II Diabetes, End Stage Renal Disease, History of pressure wounds, Neuropathy Photos [name], [name] ([id number]) [id number] Page 9 of 11 Wound Measurements Length: (cm) 0.4 Width: (cm) 0.3 Depth: (cm) 0.2 Area: (cm) 0.094 Volume: (cm) 0.019 % Reduction in Area: 98% % Reduction in Volume: 98% Epithelialization: None Wound Description Classification: Grade 3 Wound Margin: Flat and Intact Exudate Amount: Medium Exudate Type: Serosanguineous Exudate Color: red, brown Foul Odor After Cleansing: No Slough/Fibrino No Wound Bed Granulation Amount: Large (67-100%) Exposed Structure Granulation Quality: Pink, Pale Fascia Exposed: No Necrotic Amount: None Present (0%) Fat Layer (Subcutaneous Tissue) Exposed: Yes Tendon Exposed: No Muscle Exposed: No Joint  Exposed: No Bone Exposed: No Treatment Notes Wound #12 (Foot) Wound Laterality: Plantar, Left, Medial Cleanser Soap and Water Discharge Instruction: Gently cleanse wound with antibacterial soap, rinse and pat dry prior to dressing wounds Wound Cleanser Discharge Instruction: Wash your hands with soap and water. Remove old dressing, discard into plastic bag and place into trash. Cleanse the wound with Wound Cleanser prior to applying [name] clean dressing using gauze sponges, not tissues or cotton  balls. Do not scrub or use excessive force. Pat dry using gauze sponges, not tissue or cotton balls. Heather-Wound Care Topical Gentamicin Discharge Instruction: Apply as directed by . Mupirocin Ointment Discharge Instruction: Apply as directed by . Primary Dressing Hydrofera Blue Ready Transfer Foam, 2.5x2.5 (in/in) Discharge Instruction: Apply Hydrofera Blue Ready to wound bed as directed Secondary Dressing Gauze Discharge Instruction: As directed: dry heel cup Secured With Medipore T - 75M Medipore H Soft Cloth Surgical T ape ape, 2x2 (in/yd) Kerlix Roll Sterile or Non-Sterile 6-ply 4.5x4 (yd/yd) Discharge Instruction: Apply Kerlix as directed Compression Wrap Compression Stockings Add-Ons [name], [name] ([id number]) [phone].pdf Page 10 of 11 Electronic Signature(s) Signed: 05/24/2023 4:54:16 PM By: [name] Signed: 05/26/2023 11:12:15 AM By: [name], BSN, RN, CWS, [name] RN, BSN Entered By: [name] on 05/24/2023 11:30:19 -------------------------------------------------------------------------------- Wound Assessment Details Patient Name: Date of Service: [name] 05/24/2023 11:00 [name]: [id number] Patient Account Number: [id number] Date of Birth/Sex: Treating RN: [birth year] (47 y.o. [name], [name] Primary Care : [name] Other Clinician: Betha [name] Referring : Treating  /Extender: [name] in Treatment: 49 Wound Status Wound Number: 14 Primary T be determined o Etiology: Wound Location: Right, Anterior Lower Leg Wound Open Wounding Event: Trauma Status: Date Acquired: 05/14/2023 Comorbid Chronic sinus problems/congestion, Middle ear problems, Anemia, Weeks Of Treatment: 1 History: Chronic Obstructive Pulmonary Disease (COPD), Congestive Heart Clustered Wound: No Failure, Type II Diabetes, End Stage Renal Disease, History of pressure wounds, Neuropathy Photos Wound Measurements Length: (cm) 1.5 Width: (cm) 0.7 Depth: (cm) 0.1 Area: (cm) 0.825 Volume: (cm) 0.082 % Reduction in Area: 44.7% % Reduction in Volume: 72.5% Epithelialization: None Wound Description Classification: Full Thickness Without Exposed Suppor Wound Margin: Distinct, outline attached Exudate Amount: Small Exudate Type: Serosanguineous Exudate Color: red, brown t Structures Foul Odor After Cleansing: No Slough/Fibrino Yes Wound Bed Granulation Amount: None Present (0%) Exposed Structure Necrotic Amount: Large (67-100%) Fascia Exposed: No Necrotic Quality: Adherent Slough Fat Layer (Subcutaneous Tissue) Exposed: Yes Tendon Exposed: No Muscle Exposed: No Joint Exposed: No Bone Exposed: No [name], [name] ([id number]) [id number] Page 11 of 11 Electronic Signature(s) Signed: 05/24/2023 4:54:16 PM By: [name] Signed: 05/26/2023 11:12:15 AM By: [name], BSN, RN, CWS, [name] RN, BSN Entered By: [name] on 05/24/2023 11:31:08 -------------------------------------------------------------------------------- Vitals Details Patient Name: Date of Service: [name], [name] 05/24/2023 11:00 [name]: [id number] Patient Account Number: [id number] Date of Birth/Sex: Treating RN: [birth year] (47 y.o. [name], [name] Primary Care : [name] Other Clinician: Betha [name] Referring  : Treating /Extender: [name] in Treatment: 33 Vital Signs Time Taken: 11:16 Temperature (F): 98.5 Height (in): 69 Pulse (bpm): 49 Weight (lbs): 178 Respiratory Rate (breaths/min): 16 Body Mass Index (BMI): 26.3 Blood Pressure (mmHg): 123/73 Reference Range: 80 - 120 mg / dl Electronic Signature(s) Signed: 05/24/2023 4:54:16 PM By: [name] Entered By: [name] on 05/24/2023 11:19:44

## 2023-05-31 ENCOUNTER — Encounter: Payer: MEDICAID | Attending: Internal Medicine | Admitting: Internal Medicine

## 2023-05-31 DIAGNOSIS — Z9181 History of falling: Secondary | ICD-10-CM | POA: Diagnosis not present

## 2023-05-31 DIAGNOSIS — Z794 Long term (current) use of insulin: Secondary | ICD-10-CM | POA: Diagnosis not present

## 2023-05-31 DIAGNOSIS — E11622 Type 2 diabetes mellitus with other skin ulcer: Secondary | ICD-10-CM | POA: Diagnosis present

## 2023-05-31 DIAGNOSIS — E11621 Type 2 diabetes mellitus with foot ulcer: Secondary | ICD-10-CM | POA: Diagnosis not present

## 2023-05-31 DIAGNOSIS — E1142 Type 2 diabetes mellitus with diabetic polyneuropathy: Secondary | ICD-10-CM | POA: Diagnosis not present

## 2023-05-31 DIAGNOSIS — L97512 Non-pressure chronic ulcer of other part of right foot with fat layer exposed: Secondary | ICD-10-CM | POA: Diagnosis not present

## 2023-05-31 DIAGNOSIS — L97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 of skin: Secondary | ICD-10-CM | POA: Diagnosis not present

## 2023-05-31 DIAGNOSIS — I5022 Chronic systolic (congestive) heart failure: Secondary | ICD-10-CM | POA: Diagnosis not present

## 2023-05-31 DIAGNOSIS — L97528 Non-pressure chronic ulcer of other part of left foot with other specified severity: Secondary | ICD-10-CM | POA: Insufficient documentation

## 2023-05-31 DIAGNOSIS — F17210 Nicotine dependence, cigarettes, uncomplicated: Secondary | ICD-10-CM | POA: Insufficient documentation

## 2023-05-31 DIAGNOSIS — N186 End stage renal disease: Secondary | ICD-10-CM | POA: Insufficient documentation

## 2023-05-31 DIAGNOSIS — S81801A Unspecified open wound, right lower leg, initial encounter: Secondary | ICD-10-CM | POA: Diagnosis not present

## 2023-05-31 DIAGNOSIS — I132 Hypertensive heart and chronic kidney disease with heart failure and with stage 5 chronic kidney disease, or end stage renal disease: Secondary | ICD-10-CM | POA: Diagnosis not present

## 2023-05-31 DIAGNOSIS — W540XXA Bitten by dog, initial encounter: Secondary | ICD-10-CM | POA: Insufficient documentation

## 2023-05-31 DIAGNOSIS — E1122 Type 2 diabetes mellitus with diabetic chronic kidney disease: Secondary | ICD-10-CM | POA: Insufficient documentation

## 2023-06-07 ENCOUNTER — Encounter: Payer: MEDICAID | Admitting: Internal Medicine

## 2023-06-07 DIAGNOSIS — E11622 Type 2 diabetes mellitus with other skin ulcer: Secondary | ICD-10-CM | POA: Diagnosis not present

## 2023-06-07 NOTE — Progress Notes (Signed)
[name] ([id number]) [id number] Page 1 of 24 Visit Report for 05/31/2023 Chief Complaint Document Details Patient Name: Date of Service: [name], [name] 05/31/2023 11:00 [name]: [id number] Patient Account Number: [id number] Date of Birth/Sex: Treating RN: 06/07/1976 (47 y.o. [name] Primary Care Provider: [name] Other Clinician: [name] Referring Provider: Treating Provider/Extender: Chapman Moss in Treatment: 81 Information Obtained from: Patient Chief Complaint 03/19/2021; patient referred by Dr. Excell Seltzer who has been looking after her left foot for quite [name] period of time for review of [name] nonhealing area in the left midfoot 03/12/2022; bilateral feet wounds and right lower extremity wound. Electronic Signature(s) Signed: 05/31/2023 12:18:12 PM By: Geralyn Corwin DO Entered By: Geralyn Corwin on 05/31/2023 12:03:34 -------------------------------------------------------------------------------- Debridement Details Patient Name: Date of Service: [name] 05/31/2023 11:00 [name]: [id number] Patient Account Number: [id number] Date of Birth/Sex: Treating RN: Jan 27, 1976 (47 y.o. Cathlean Cower, [name] Primary Care Provider: [name] Other Clinician: [name] Referring Provider: Treating Provider/Extender: Chapman Moss in Treatment: 63 Debridement Performed for Assessment: Wound #12 Left,Medial,Plantar Foot Performed By: Physician Geralyn Corwin, MD Debridement Type: Debridement Severity of Tissue Pre Debridement: Fat layer exposed Level of Consciousness (Pre-procedure): Awake and Alert Pre-procedure Verification/Time Out Yes - 11:25 Taken: Start Time: 11:25 Percent of Wound Bed Debrided: 100% T Area Debrided (cm): otal 0.02 Tissue and other material debrided: Viable, Non-Viable, Callus, Slough, Skin: Dermis , Skin: Epidermis,  Slough Level: Skin/Epidermis Debridement Description: Selective/Open Wound Instrument: Curette Bleeding: Minimum Hemostasis Achieved: Pressure Response to Treatment: Procedure was tolerated well Level of Consciousness (Post- Awake and Alert [name], [name] ([id number]) [id number] Page 2 of 24 Awake and Alert procedure): Post Debridement Measurements of Total Wound Length: (cm) 0.3 Width: (cm) 0.1 Depth: (cm) 0.1 Volume: (cm) 0.002 Character of Wound/Ulcer Post Debridement: Improved Severity of Tissue Post Debridement: Fat layer exposed Post Procedure Diagnosis Same as Pre-procedure Electronic Signature(s) Signed: 05/31/2023 12:18:12 PM By: Geralyn Corwin DO Signed: 05/31/2023 5:47:33 PM By: [name] Signed: 06/07/2023 2:28:30 PM By: Elliot Gurney, BSN, RN, CWS, [name] RN, BSN Entered By: [name] on 05/31/2023 11:26:31 -------------------------------------------------------------------------------- Debridement Details Patient Name: Date of Service: [name] 05/31/2023 11:00 [name]: [id number] Patient Account Number: [id number] Date of Birth/Sex: Treating RN: 1976-06-30 (47 y.o. [name] Primary Care Provider: [name] Other Clinician: [name] Referring Provider: Treating Provider/Extender: Chapman Moss in Treatment: 63 Debridement Performed for Assessment: Wound #11 Right,Plantar Foot Performed By: Physician Geralyn Corwin, MD Debridement Type: Debridement Level of Consciousness (Pre-procedure): Awake and Alert Pre-procedure Verification/Time Out Yes - 11:28 Taken: Start Time: 11:28 Percent of Wound Bed Debrided: 100% T Area Debrided (cm): otal 0.03 Tissue and other material debrided: Viable, Non-Viable, Callus, Skin: Dermis , Skin: Epidermis Level: Skin/Epidermis Debridement Description: Selective/Open Wound Instrument: Curette Bleeding: Minimum Hemostasis Achieved:  Pressure Response to Treatment: Procedure was tolerated well Level of Consciousness (Post- Awake and Alert procedure): Post Debridement Measurements of Total Wound Length: (cm) 0.2 Width: (cm) 0.2 Depth: (cm) 0.4 Volume: (cm) 0.013 Character of Wound/Ulcer Post Debridement: Improved Post Procedure Diagnosis Same as Pre-procedure Electronic Signature(s) Signed: 05/31/2023 12:18:12 PM By: Geralyn Corwin DO Signed: 05/31/2023 5:47:33 PM By: [name] Signed: 06/07/2023 2:28:30 PM By: Elliot Gurney BSN, RN, CWS, [name] RN, BSN [name], [name] ([id number]) [id number].pdf Page 3 of 24 Signed: 06/07/2023 2:28:30 PM By: Elliot Gurney, BSN, RN, CWS, [name] RN, BSN Entered By: [name]  on 05/31/2023 11:29:02 -------------------------------------------------------------------------------- HPI Details Patient Name: Date of Service: [name], [name] 05/31/2023 11:00 [name]: [id number] Patient Account Number: [id number] Date of Birth/Sex: Treating RN: 1975/12/05 (47 y.o. [name] Primary Care Provider: [name] Other Clinician: [name] Referring Provider: Treating Provider/Extender: Chapman Moss in Treatment: 67 History of Present Illness HPI Description: 01/18/18-She is here for initial evaluation of the left great toe ulcer. She is [name] poor historian in regards to timeframe in detail. She states approximately 4 weeks ago she lacerated her toe on something in the house. She followed up with her primary care who placed her on Bactrim and ultimately [name] second dose of Bactrim prior to coming to wound clinic. She states she has been treating the toe with peroxide, Betadine and [name] Band-Aid. She did not check her blood sugar this morning but checked it yesterday morning it was 327; she is unaware of [name] recent A1c and there are no current records. She saw Dr. she would've orthopedics last week for an old injury to the left ankle, she  states he did not see her toe, nor did she bring it to his attention. She smokes approximately 1 pack cigarettes [name] day. Her social situation is concerning, she arrives this morning with her mother who appears extremely intoxicated/under the influence; her mother was asked to leave the room and be monitored by the patient's grandmother. The patient's aunt then accompanied the patient and the room throughout the rest of the appointment. We had [name] lengthy discussion regarding the deleterious effects of uncontrolled hyperglycemia and smoking as it relates to wound healing and overall health. She was strongly encouraged to decrease her smoking and get her diabetes under better control. She states she is currently on [name] diet and has cut down her [facility] consumption. The left toe is erythematous, macerated and slightly edematous with malodor present. The edema in her left foot is below her baseline, there is no erythema streaking. We will treat her with Santyl, doxycycline; we have ordered and xray, culture and provided [name] Peg assist surgical shoe and cultured the wound. 01/25/18-She is here in follow-up evaluation for [name] left great toe ulcer and presents with an abscess to her suprapubic area. She states her blood sugars remain elevated, feeling "sick" and if levels are below 250, but she is trying. She has made no attempt to decrease her smoking stating that we "can't take away her food in her cigarettes". She has been compliant with offloading using the PEG assist you. She is using Santyl daily. the culture obtained last week grew staph aureus and Enterococcus faecalis; continues on the doxycycline and Augmentin was added on Monday. The suprapubic area has erythema, no femoral variation, purple discoloration, minimal induration, was accessed with [name] cotton tip applicator with sanguinopurulent drainage, this was cultured, I suspect the current antibiotic treatment will cover and we will not add anything to her  current treatment plan. She was advised to go to urgent care or ER with any change in redness, induration or fever. 02/01/18-She is here in follow-up evaluation for left great toe ulcers and [name] new abdominal abscess from last week. She was able to use packing until earlier this week, where she "forgot it was there". She states she was feeling ill with GI symptoms last week and was not taking her antibiotic. She states her glucose levels have been predominantly less than 200, with occasional levels between 200-250. She thinks this was contributing  to her GI symptoms as they have resolved without intervention. There continues to be significant laceration to left toe, otherwise it clinically looks stable/improved. There is now less superficial opening to the lateral aspect of the great toe that was residual blister. We will transition to [facility] to all wounds, she will continue her Augmentin. If there is no change or deterioration next week for reculture. 02/08/18-She is here in follow-up evaluation for left great toe ulcer and abdominal ulcer. There is an improvement in both wounds. She has been wrapping her left toe with coban, not by our direction, which has created an area of discoloration to the medial aspect; she has been advised to NOT use coban secondary to her neuropathy. She states her glucose levels have been high over this last week ranging from 200-350, she continues to smoke. She admits to being less compliant with her offloading shoe. We will continue with same treatment plan and she will follow-up next week. 02/15/18-She is here in follow-up evaluation for left great toe ulcer and abdominal ulcer. The abdominal ulcer is epithelialized. The left great toe ulcer is improved and all injury from last week using the Coban wrap is resolved, the lateral ulcer is healed. She admits to noncompliance with wearing offloading shoe and admits to glucose levels being greater than 300 most of the  week. She continues to smoke and expresses no desire to quit. There is one area medially that probes deeper than it has historically, erythema to the toe and dorsal foot has consistently waxed and waned. There is no overt signs of cellulitis or infection but we will culture the wound for any occult infection given the new area of depth and erythema. We will hold off on sensitivities for initiation of antibiotic therapy. 02/22/18-She is here in follow up evaluation for left great toe ulcer. There is overall significant improvement in both wound appearance, erythema and edema with changes made last week. She was not initiated on antibiotic therapy. Culture obtained last week showed oxacillin sensitive staph aureus, sensitive to clindamycin. Clindamycin has been called into the pharmacy but she has been instructed to hold off on initiation secondary to overall clinical improvement and her history of antibiotic intolerance. She has been instructed to contact the clinic with any noted changes/deterioration and the wound, erythema, edema and/or pain. She will follow-up next week. She continues to smoke and her glucose levels remain elevated >250; she admits to compliance with offloading shoe 03/01/18 on evaluation today patient appears to be doing fairly well in regard to her left first toe ulcer. She has been tolerating the dressing changes with the [facility] Dressing without complication and overall this has definitely showed signs of improvement according to records as well is what the patient tells me today. I'm very pleased in that regard. She is having no pain today 03/08/18 She is here for follow up evaluation of [name] left great toe ulcer. She remains non-compliant with glucose control and smoking cessation; glucose levels consistently >200. She states that she got new shoe inserts/peg assist. She admits to compliance with offloading. Since my last evaluation there is significant improvement. We will  switch to prisma at this time and she will follow up next week. She is noted to be tachycardic at this appointment, heart rate 120s; she has [name] history of heart rate 70-130 according to our records. She admits to extreme agitation r/t personal issues; she was advised to monitor her heartrate and contact her physician if it does not  return to [name] more normal range (<100). She takes cardizem twice daily. 03/15/18-She is here in follow-up evaluation for left great toe ulcer. She remains noncompliant with glucose control and smoking cessation. She admits to compliance with wearing offloading shoe. The ulcer is improved/stable and we will continue with the same treatment plan and she will follow-up next week 03/22/18-She is here for evaluation for left great toe ulcer. There continues to be significant improvement despite recurrent hyperglycemia (over 500 yesterday) and she continues to smoke. She has been compliant with offloading and we will continue with same treatment plan and she will follow-up next week. 03/29/18-She is here for evaluation for left great toe ulcer. Despite continuing to smoke and uncontrolled diabetes she continues to improve. She is compliant with offloading shoe. We will continue with the same treatment plan and she will follow-up next week 04/05/18- She is here in follow up evaluation for [name] left great toe ulcer; she presents with small pustule to left fifth toe (resembles ant bite). She admits to compliance with wearing offloading shoe; continues to smoke or have uncontrolled blood glucose control. There is more callus than usual with evidence of bleeding; she denies known trauma. [name], [name] ([id number]) [id number] Page 4 of 24 04/12/18-She is here for evaluation of left great toe ulcer. Despite noncompliance with glycemic control and smoking she continues to make improvement. She continues to wear offloading shoe. The pustule, that was identified last  week, to the left fifth toe is resolved. She will follow-up in 2 weeks 05/03/18-she is seen in follow-up evaluation for [name] left great toe ulcer. She is compliant with offloading, otherwise noncompliant with glycemic control and smoking. She has plateaued and there is minimal improvement noted. We will transition to [facility], replaced the insert to her surgical shoe and she will follow-up in one week 05/10/18- She is here in follow up evaluation for [name] left great toe ulcer. It appears stable despite measurement change. We will continue with same treatment plan and follow up next week. 05/24/18-She is seen in follow-up evaluation for [name] left great toe ulcer. She remains compliant with offloading, has made significant improvement in her diet, decreasing the amount of sugar/soda. She said her recent A1c was 10.9 which is lower than. She did see [name] diabetic nutritionist/educator yesterday. She continues to smoke. We will continue with the same treatment plan and she'll follow-up next week. 05/31/18- She is seen in follow-up evaluation for left great toe ulcer. She continues to remain compliant with offloading, continues to make improvement in her diet, increasing her water and decreasing the amount of sugar/soda. She does continue to smoke with no desire to quit. We will apply Prisma to the depth and Hydrofera Blue over. We have not received insurance authorization for oasis. She will follow up next week. 06/07/18-She is seen in follow-up evaluation for left great toe ulcer. It has stalled according to today's measurements although base appears stable. She says she saw [name] diabetic educator yesterday; her average blood sugars are less than 300 which is an improvement for her. She continues to smoke and states "that's my next step" She continues with water over soda. We will order for xray, culture and reinstate ace wrap compression prior to placing apligraf for next week. She is voicing no complaints or concerns.  Her dressing will change to iodoflex over the next week in preparation for apligraf. 06/14/18-She is seen in follow-up evaluation for left great toe ulcer. Plain film x-ray performed last week was negative  for osteomyelitis. Wound culture obtained last week grew strep B and OSSA; she is initiated on keflex and cefdinir today; there is erythema to the toe which could be from ace wrap compression, she has [name] history of wrapping too tight and has has been encouraged to maintain ace wraps that we place today. We will hold off on application of apligraf today, will apply next week after antibiotic therapy has been initiated. She admits today that she has resumed taking [name] shower with her foot/toe submerged in water, she has been reminded to keep foot/toe out of the bath water. She will be seen in follow up next week 06/21/18-she is seen in follow-up evaluation for left great toe ulcer. She is tolerating antibiotic therapy with no GI disturbance. The wound is stable. Apligraf was applied today. She has been decreasing her smoking, only had 4 cigarettes yesterday and 1 today. She continues being more compliant in diabetic diet. She will follow-up next week for evaluation of site, if stable will remove at 2 weeks. 06/28/18- She is here in follow up evalution. Apligraf was placed last week, she states the dressing fell off on Tuesday and she was dressing with hydrofera blue. She is healed and will be discharged from the clinic today. She has been instructed to continue with smoking cessation, continue monitoring glucose levels, offloading for an additional 4 weeks and continue with hydrofera blue for additional two weeks for any possible microscopic opening. Readmission: 08/07/18 on evaluation today patient presents for reevaluation concerning the ulcer of her right great toe. She was previously discharged on 06/28/18 healed. Nonetheless she states that this began to show signs of drainage she subsequently went to her  primary care provider. Subsequently an x-ray was performed on 08/01/18 which was negative. The patient was also placed on antibiotics at that time. Fortunately they should have been effective for the infection. Nonetheless she's been experiencing some improvement but still has [name] lot of drainage coming from the wound itself. 08/14/18 on evaluation today patient's wound actually does show signs of improvement in regard to the erythema at this point. She has completed the antibiotics. With that being said we did discuss the possibility of placing her in [name] total contact cast as of today although I think that I may want to give this just [name] little bit more time to ensure nothing recurrence as far as her infection is concerned. I do not want to put in the cast and risk infection at that time if things are not completely resolved. With that being said she is gonna require some debridement today. 08/21/18 on evaluation today patient actually appears to be doing okay in regard to her toe ulcer. She's been tolerating the dressing changes without complication. With that being said it does appear that she is ready and in fact I think it's appropriate for [place] to go ahead and initiate the total contact cast today. Nonetheless she will require some sharp debridement to prepare the wound for application. Overall I feel like things have been progressing well but we do need to do something to get this to close more readily. 08/24/18 patient seen today for reevaluation after having had the total contact cast applied on Tuesday. She seems to have done very well the wound appears to be doing great and overall I'm pleased with the progress that she's made. There were no abnormal areas of rubbing from the cast on her lower extremity. 08/30/18 on evaluation today patient actually appears to be completely healed in  regard to her plantar toe ulcer. She tells me at this point she's been having [name] lot of issues with the cast. She  almost fell [name] couple of times the state shall the step of her dog [name] couple times as well. This is been [name] very frustrating process for her other nonetheless she has completely healed the wound which is excellent news. Overall there does not appear to be the evidence of infection at this time which is great news. 09/11/18 evaluation today patient presents for follow-up concerning her great toe ulcer on the left which has unfortunately reopened since I last saw her which was only [name] couple of weeks ago. Unfortunately she was not able to get in to get the shoe and potentially the AFO that's gonna be necessary due to her left foot drop. She continues with offloading shoe but this is not enough to prevent her from reopening it appears. When we last had her in the total contact cast she did well from [name] healing standpoint but unfortunately the wound reopened as soon as she came out of the cast within just [name] couple of weeks. 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 This is also part of the reason why she falls on [name] regular basis. I just do not believe that is gonna be safe for [place] to reinitiate the total contact cast as last time we had this on she fell 3 times one week which is definitely not normal for her. 09/18/18 upon evaluation today the patient actually appears to be doing about the same in regard to her toe ulcer. She did not contact [facility] as I asked her to even though I had given her the prescription. In fact she actually states that she has no idea where the prescription is. She did apparently call [employer] and they told her that all she needed to do was bring the prescription in order to be able to be seen and work on getting the AFO for her left foot. With all that being said she still does not have an  appointment and I'm not sure were things stand that regard. I will give her [name] new prescription today in order to contact them to get this set up. 09/25/18 on evaluation today patient actually appears to be doing about the same in regard to her toes ulcer. She does have [name] small areas which seems to have [name] lot of callous buildup around the edge of the wound which is going to need sharp debridement today. She still is waiting to be scheduled for evaluation with [facility] for possibility of an AFO. She states there supposed to call her tomorrow to get this set up. Unfortunately it does appear that her foot specifically the toe area is showing signs of erythema. There does not appear to be any systemic infection which is in these good news. 10/02/18 on evaluation today patient actually appears to be doing about the same in regard to her toe ulcer. This really has not done too well although it's not significantly larger it's also not significantly smaller. She has been tolerating the dressing changes without complication. She actually has her appointment with [facility] and [place] tomorrow to hopefully be measured for obtaining and AFO splint. I think this would be helpful preventing  this from reoccurring. We had contemplated starting the cast this week although to be honest I am reluctant to do that as she's been having nausea, vomiting, and seizure activity over the past three days. She has [name] history of seizures and have been told is nothing that can be done for these. With that being said I do believe that along with the seizures have the nausea vomiting which upon further questioning doesn't seem to be the normal for her and makes me concerned for the possibility of infection or something else going on. I discussed this with the patient and her mother during the office visit today. I do not feel the wound is effective but maybe something else. The responses this was "this just happens to her at times and we  don't know why". They did not seem to be interested in going to the hospital to have this checked out further. 10/09/18 on evaluation today patient presents for follow-up concerning her ongoing toe ulcer. She has been tolerating the dressing changes without complication. Fortunately there does not appear to be any evidence of infection which is great news however I do think that the patient would benefit from going ahead for with the total contact cast. She's actually in [name] wheelchair today she tells me that she will use her walker if we initiate the cast. I was very specific about the fact that if we were gonna do the cast I wanted to make sure that she was using the walker in order to prevent any falls. She tells me she does not have stairs that she has to traverse on [name] regular basis at her home. She has not had any seizures since last week again that something that happens to her often she tells me she did talk to [employer] and they said that it may take up to three weeks to get the brace approved for her. Hopefully that will not take that long but nonetheless in the meantime I do think the cast could be of benefit. [name], [name] ([id number]) [id number] Page 5 of 24 10/12/18 on evaluation today patient appears to be doing rather well in regard to her toe ulcer. It's just been [name] few days and already this is significantly improved both as far as overall appearance and size. Fortunately there's no sign of infection. She is here for her first obligatory cast change. 10/19/18 Seen today for follow up and management of left great toe ulcer. Wound continues to show improvement. Noted small open area with seroussang drainage with palpation. Denies any increased pain or recent fevers during visit. She will continue calcium alginate with offloading shoe. Denies any questions or concerns during visit. 10/26/18 on evaluation today patient appears to be doing about the same as when I  last saw her in regard to her wound bed. Fortunately there does not appear to be any signs of infection. Unfortunately she continues to have [name] breakdown in regard to the toe region any time that she is not in the cast. It takes almost no time at all for this to happen. Nonetheless she still has not heard anything from the brace being made by [employer] as to when exactly this will be available to her. Fortunately there is no signs of infection at this time. 10/30/18 on evaluation today patient presents for application of the total contact cast as we just received him this morning. Fortunately we are gonna be able to apply this to her today which is great news. She continues  to have no significant pain which is good news. Overall I do feel like things have been improving while she was the cast is when she doesn't have [name] cast that things get worse. She still has not really heard anything from [employer] regarding her brace. 11/02/18 upon evaluation today patient's wound already appears to be doing significantly better which is good news. Fortunately there does not appear to be any signs of infection also good news. Overall I do think the total contact cast as before is helping to heal this area unfortunately it's just not gonna likely keep the area closed and healed without her getting her brace at least. Again the foot drop is [name] significant issue for her. 11/09/18 on evaluation today patient appears to be doing excellent in regard to her toe ulcer which in fact is completely healed. Fortunately we finally got the situation squared away with the paperwork which was needed to proceed with getting her brace approved by Medicaid. I have filled that out unfortunately that information has been sent to the orthopedic office that I worked at 2 1/2 years ago and not tired Current wound care measures. Fortunately she seems to be doing very well at this time. 11/23/18 on evaluation today patient appears to be doing More  Poorly Compared to Last Time I Saw Her. At [facility] She Had Completely Healed. Currently she is continuing to have issues with reopening. She states that she just found out that the brace was approved through Medicaid now she just has to go get measured in order to have this fitted for her and then made. Subsequently she does not have an appointment for this yet that is going to complicate things we obviously cannot put her back in the cast if we do not have everything measured because they're not gonna be able to measure her foot while she is in the cast. 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 Obviously this is unfortunate and does have me somewhat worried as well. 11/30/18 on evaluation today patient's toe ulcer actually appears to be doing fairly well. The good news is she will be getting her brace in the shoes next week on Wednesday. Hopefully we will be able to get this to heal without having to go back in the cast however she may need the cast in order to get the wound completely heal and then go from there. Fortunately there's no signs of infection at this time. 12/07/18 on evaluation today patient fortunately did receive her brace and she states she could tell this definitely makes her walk better. With that being said she's been having issues with her toe where she noticed yesterday there was [name] lot of tissue that was loosing off this appears to be much larger than what it was previous. She also states that her leg has been read putting much across the top of her foot just about the ankle although this seems to be receiving somewhat. The total area is still red and appears to be someone infected as best I can tell. She is previously taken Bactrim and that may be [name] good option for her today as well. We are gonna see what I wound culture shows as well and I think that this is definitely  appropriate. With that being said outside of the culture I still need to initiate something in the interim and that's what  I'm gonna go ahead and select Bactrim is [name] good option for her. 12/14/18 on evaluation today patient appears to be doing better in regard to her left great toe ulcer as compared to last week's evaluation. There's still some erythema although this is significantly improved which is excellent news. Overall I do believe that she is making good progress is still gonna take some time before she is where I would like her to be from the standpoint of being able to place her back into the total contact cast. Hopefully we will be where we need to be by next week. 12/21/18 on evaluation today patient actually appears to be doing poorly in regard to her toe ulcer. She's been tolerating the dressing changes without complication. Fortunately there's no signs of systemic infection although she does have [name] lot of drainage from the toe ulcer and this does seem to be causing some issues at this point. She does have erythema on the distal portion of her toe that appears to be likely cellulitis. 12/28/18 on evaluation today patient actually appears to be doing [name] little better in my pinion in regard to her toe ulcer. With that being said she still does have some evidence of infection at this time and for her culture she had both E. coli as well as enterococcus as organisms noted on evaluation. For that reason I think that though the Keflex likely has treated the E. coli rather well this has really done nothing for the enterococcus. We are going to have to initiate treatment for this specifically. 01/04/19 on evaluation today patient's toe actually appears to be doing better from the standpoint of infection. She currently would like to see about putting the cash back on I think that this is appropriate as long as she takes care of it and keeps it from getting wet. She is gonna have some drainage we can  definitely pass this up with Drawtex and alginate to try to prevent as much drainage as possible from causing the problems. With that being said I do want to at least try her with the cast between now and Tuesday. If there any issues we can't continue to use it then I will discontinue the use of the cast at that point. 01/08/19 on evaluation today patient actually appears to be doing very well as far as her foot ulcer specifically the great toe on the left is concerned. She did have an area of rubbing on the medial aspect of her left ankle which again is from the cast. Fortunately there's no signs of infection at this point in this appears to be [name] very slight skin breakdown. The patient tells me she felt it rubbing but didn't think it was that bad. Fortunately there is no signs of active infection at this time which is good news. No fevers, chills, nausea, or vomiting noted at this time. 01/15/19 on evaluation today patient actually appears to be doing well in regard to her toe ulcer. Again as previous she seems to do well and she has the cast on which indicates to me that during the time she doesn't have [name] cast on she's putting way too much pressure on this region. Obviously I think that's gonna be an issue as with the current national emergency concerning the Covid-19 Virus it has been recommended that we discontinue the use of total contact casting by the chief medical officer of our company, Dr. Maurine Minister. The reasoning is that if [name] patient becomes sick and cannot  come into have the cast removed they could not just leave this on for an additional two weeks. Obviously the hospitals also do not want to receive patient's who are sick into the emergency department to potentially contaminate the region and spread the Covid-19 Virus among other sick individuals within the hospital system. Therefore at this point we are suspending the use of total contact cast until the current emergency subsides. This was all  discussed with the patient today as well. 01/22/19 on evaluation today patient's wound on her left great toe appears to be doing slightly worse than previously noted last week. She tells me that she has been on this quite [name] bit in fact she tells me she's been awake for 38 straight hours. This is due to the fact that she's having to care for grandparents because nobody else will. She has been taking care of them for five the last seven days since I've seen her they both have dementia his is from [name] stroke and her grandmother's was progressive. Nonetheless she states even her mom who knows her condition and situation has only help two of those days to take care of them she's been taking care of the rest. Fortunately there does not appear to be any signs of active infection in regard to her toe at this point although obviously it doesn't look as good as it did previous. I think this is directly related to her not taking off the pressure and friction by way of taking things easy. Though I completely understand what's going on. 01/29/19 on evaluation today patient's tools are actually appears to be showing some signs of improvement today compared to last week's evaluation as far as not necessarily the overall size of the wound but the fact that she has some new skin growth in between the two ends of the wound opening. Overall I feel like she has done well she states that she had [name] family member give her what sounds to be [name] CAM walker boot which has been helpful as well. 02/05/19 on evaluation today patient's wound bed actually appears to be doing significantly better in regard to her overall appearance of the size of the wound. With that being said she is still having an issue with offloading efficiently enough to get this to close. Apparently there is some signs of infection at this point as well unfortunately. Previously she's done well of Augmentin I really do not see anything that needs to be culture  currently but there theme and cellulitis of the foot that I'm seeing I'm gonna go ahead and place her on an antibiotic today to try to help clear this up. [name], [name] ([id number]) [id number] Page 6 of 24 02/12/2019 on evaluation today patient actually appears to be doing poorly in regard to her overall wound status. She tells me she has been using her offloading shoe but actually comes in today wearing her tennis shoe with the AFO brace. Again as I previously discussed with her this is really not sufficient to allow the area to heal appropriately. Nonetheless she continues to be somewhat noncompliant and I do wonder based on what she has told my nurse in the past as to whether or not [name] good portion of this noncompliance may be recreational drug and alcohol related. She has had [name] history of heroin overdose and this was fairly recently in the past couple of months that have been seeing her. Nonetheless overall I feel like her wound looks significantly worse today  compared to what it was previous. She still has significant erythema despite the Augmentin I am not sure that this is an appropriate medication for her infection I am also concerned that the infection may have gone down into her bone. 02/19/19 on evaluation today patient actually appears to be doing about the same in regard to her toe ulcer. Unfortunately she continues to show signs of bone exposure and infection at this point. There does not appear to be any evidence of worsening of the infection but I'm also not really sure that it's getting significantly better. She is on the Augmentin which should be sufficient for the Staphylococcus aureus infection that she has at this point. With that being said she may need IV antibiotics to more appropriately treat this. We did have [name] discussion today about hyperbaric option therapy. 02/28/19 on evaluation today patient actually appears to be doing much worse in regard to  the wound on her left great toe as compared to even my previous evaluation last week. Unfortunately this seems to be training in [name] pretty poor direction. Her toe was actually now starting to angle laterally and I can actually see the entire joint area of the proximal portion of the digit where is the distal portion of the digit again is no longer even in contact with the joint line. Unfortunately there's [name] lot more necrotic tissue around the edge and the toe appears to be showing signs of becoming gangrenous in my pinion. I'm very concerned about were things stand at this point. She did see infectious disease and they are planning to send in [name] prescription for Sivextro for her and apparently this has been approved. With that being said I don't think she should avoid taking this but at the same time I'm not sure that it's gonna be sufficient to save her toe at this point. She tells me that she still having to care for grandparents which I think is putting quite [name] bit of strain on her foot and specifically the total area and has caused this to break down even to [name] greater degree than would've otherwise been expected. 03/05/19 on evaluation today patient actually appears to be doing quite well in regard to her toe all things considering. She still has bone exposed but there appears to be much less your thing on overall the appearance of the wound and the toe itself is dramatically improved. She still does have some issues currently obviously with infection she did see vascular as well and there concerned that her blood flow to the toad. For that reason they are setting up for an angiogram next week. 03/14/19 on evaluation today patient appears to be doing very poor in regard to her toe and specifically in regard to the ulceration and the fact that she's starting to notice the toe was leaning even more towards the lateral aspect and the complete joint is visible on the proximal aspect of the joint.  Nonetheless she's also noted [name] significant odor and the tip of the toe is turning more dark and necrotic appearing. Overall I think she is getting worse not better as far as this is concerned. For that reason I am recommending at this point that she likely needs to be seen for likely amputation. READMISSION 03/19/2021 This is [name] patient that we cared for in this clinic for [name] prolonged period of time in 2019 and 2020 with [name] left foot and left first toe wound. I believe she ultimately became infected and underwent  [name] left first toe amputation. 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 Excell Seltzer. In December 2021 she had an ulcer on her right great toe as well as the fourth and fifth toes. 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 She also had an angiogram at that time and underwent angioplasty of the right anterior tibial artery. In any case she claims that the wound on the right foot is closed I did not look at this today which was probably an oversight although I think that should be done next week. 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 According to Dr. Bernette Redbird last review that she was out of the area being cared for by another physician but recently came back to his attention. 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 [name] culture of this area showed E. coli apparently before she came back to see Dr. Excell Seltzer she was supposed to be receiving antibiotics but she did not really take them. Nor is she offloading this area at all. Finally her last hemoglobin A1c listed in epic was in March 2022 at 14.1 she says things are [name] lot better since then although I am not sure. 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 She had this wound at the time. During this admission she had E. coli sepsis [name] TEE was  negative. 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 She is still [name] heavy cigarette smoker. She has [name] history of bipolar disorder seizure disorder ABI in our clinic was 1.05 6/1; left midfoot in the setting of [name] TMA done previously. Round circular wound with [name] "knuckle" of protruding tissue. The problem is that the knuckle was not attached to any of the surrounding granulation and this probed proximally widely I removed [name] large portion of this tissue. This wound goes with considerable undermining laterally. I do not feel any bone there was no purulence but this is [name] deep wound. 6/8; in spite of the debridement I did last week. She arrives with [name] wound looking exactly the same. [name] protruding "knuckle" of tissue nonadherent to most of the surrounding tissue. There is considerable depth around this from 6-12 o'clock at 2.7 cm and undermining of 1 cm. This does not look overtly infected and the x- ray I did last week was negative for any osseous abnormalities. We have been using silver collagen 6/15; deep tissue culture I did last week showed moderate staph aureus and moderate Pseudomonas. This will definitely require prolonged antibiotic therapy. The pathology on the protuberant area was negative for malignancy fungus etc. 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 We have been using silver collagen. I am going to be prescribing Levaquin for 2 weeks. Her CT scan of the foot is down for 7/5 6/22; CT scan of the foot on 7 5. She says she has hardware in the left leg from her previous fracture. She is on the Levaquin for the deep tissue culture I did that showed methicillin sensitive staph aureus  and Pseudomonas. I gave her [name] 2-week supply and she will have  another week. She arrives in clinic today with the same protuberant tissue however this is nonadherent to the tissue surrounding it. I am really at [name] loss to explain this unless there is underlying deep tissue infection 6/29; patient presents for 1 week follow-up. She has been using collagen to the wound bed. She reports taking her antibiotics as prescribed.She has no complaints or issues today. She denies signs of infection. 7/6; patient presents for one week followup. She has been using collagen to the wound bed. She states she is taking Levaquin however at times she is not able to keep it down. She denies signs of infection. 7/13; patient presents for 1 week follow-up. She has been using silver alginate to the wound bed. She still has nausea when taking her antibiotics. She denies signs of infection. 7/20; patient presents for 1 week follow-up. She has been using silver alginate with gentamicin cream to the wound bed. She denies any issues and has no complaints today. She denies signs of infection. 7/27; patient presents for 1 week follow-up. She continues to use silver alginate with gentamicin cream to the wound bed. She reports starting her antibiotics. She has no issues or complaints. Overall she reports stability to the wound. 8/3; patient presents for 1 week follow-up. She has been using silver alginate with gentamicin cream to the wound bed. She reports completing all antibiotics. She has no issues or complaints today. She denies signs of infection. 8/17; patient presents for 2-week follow-up. He is to use silver alginate to the wound bed. She has no issues or complaints today. She denies signs of infection. She reports her pain has improved in her foot since last clinic visit 8/24; patient presents for 1 week follow-up. She continues to use silver alginate to the wound bed. She has no issues or complaints. She denies signs  of infection. Pain is stable. 9/7; patient presents for follow-up. She missed her last week appointment due to feeling ill. She continues to use silver alginate. She has [name] new wound to the right lower extremity that is covered in eschar. She states It occurred over the past week and has no idea how it started. She currently denies signs of infection. 9/14; patient presents for follow-up. T the left foot wound she has been using gentamicin cream and silver alginate. T the right lower extremity wound she has o o been keeping this covered and has not obtain Santyl. 9/21; patient presents for follow-up. She reports using gentamicin cream and silver alginate to the left foot and Santyl to the right lower extremity wound. She has no issues or complaints today. She denies signs of infection. 9/28; patient presents for follow-up. She reports [name] new wound to her right heel. She states this occurred [name] few days ago and is progressively gotten worse. She [name], [name] ([id number]) [id number] Page 7 of 24 has been trying to clean the area with [name] Q-tip and Santyl. She reports stability in the other 2 wounds. She has been using gentamicin cream and silver alginate to the left foot and Santyl to the right lower extremity wound. 10/12; patient presents for follow-up. She reports improvement to the wound beds. She is seeing vein and vascular to discuss the potential of [name] left BKA. She states they are going to do an arteriogram. She continues to use silver alginate with dressing changes to her wounds. 11/2; patient presents for follow-up. She states she has not been doing dressing changes to the wound beds. She states she is  not able to offload the areas. She reports chronic pain to her left foot wound. 11/9; patient presents for follow-up. She came in with only socks on. She states she forgot to put on shoes. It is unclear if she is doing any dressing changes. She currently denies  systemic signs of infection. 11/16; patient presents for follow-up. She came again only with socks on. She states she does not wear shoes ever. It is unclear if she does dressing changes. She currently denies systemic signs of infection. 11/23; patient presents for follow-up. She wore her shoes today. It still unclear exactly what dressing she is using for each wound but she did states she obtained Dakin's solution and has been using this to the left foot wound. She currently denies signs of infection. 11/30; patient presents for follow-up. She has no issues or complaints today. She currently denies signs of infection. 12/7; patient presents for follow-up. She has no issues or complaints today. She has been using Hydrofera Blue to the right heel wound and Dakin solution to the left foot wound. Her right anterior leg wound is healed. She currently denies signs of infection. 12/14; patient presents for follow-up. She has been using Hydrofera Blue to the right heel and Dakin's to the left foot wounds. She has no issues or complaints today. She denies signs of infection. 12/21; patient presents for follow-up. She reports using Hydrofera Blue to the right heel and Dakin's to the left foot wound. She denies signs of infection. 12/28; patient presents for follow-up. She continues to use Dakin's to the left foot wound and Hydrofera Blue to the right heel wound. She denies signs of infection. 1/4; patient presents for follow-up. She has no issues or complaints today. She denies signs of infection. 1/11; patient presents for follow-up. It is unclear if she has been dressing these wounds over the past week. She currently denies signs of infection. 1/18; patient presents for follow-up. She states she has been using Dakin's wet-to-dry dressings to the left foot. She has been using Hydrofera Blue to the right foot foot wound. She states that the anterior right leg wound has reopened and draining serous fluid. She  denies signs of infection. 1/25; patient presents for follow-up. She has no issues or complaints today. 2/1; patient presents for follow-up. She has no issues or complaints today. She denies signs of infection. 2/8; patient presents for follow-up. She has lost her surgical shoes. She did not have [name] dressing to the right heel wound. She currently denies signs of infection. 2/15; patient presents for follow-up. She reports more pain to the right heel today. She denies purulent drainage Or fever/chills 2/22; patient presents for follow-up. She reports taking clindamycin over the past week. She states that she continues to have pain to her right heel. She reports purulent drainage. Readmission 03/16/2022 Ms. Bryon Olden is [name] 47 year old female with [name] past medical history of type 2 diabetes, osteomyelitis to her feet, chronic systolic heart failure and bipolar disorder that presents to the clinic for bilateral feet wounds and right lower extremity wound. She was last seen in our clinic on 12/15/2021. At that time she had purulent drainage coming out of her right plantar foot and I recommended she go to the ED. She states she went to [facility] and has been there for the past 3 months. I cannot see the records. She states she had OR debridement and was on several weeks of IV antibiotics while inpatient. Since discharge she has not been  taking care of the wound beds. She had nothing on her feet other than socks today. She currently denies signs of infection. 5/31; patient presents for follow-up. She has been using Dakin's wet-to-dry dressings to the wound beds on her feet bilaterally and antibiotic ointment to the right anterior leg wound. She had [name] wound culture done at last clinic visit that showed moderate Pseudomonas aeruginosa sensitive to ciprofloxacin. She currently denies systemic signs of infection. 6/14; patient presents for follow-up. She received Keystone 5 days ago and has been  using this on the wound beds. She states that last week she had to go to the hospital because she had increased warmth and erythema to the right foot. She was started on 2 oral antibiotics. She states she has been taking these. She currently denies systemic signs of infection. She has no issues or complaints today. 6/21; patient presents for follow-up. She states she has been using Keystone antibiotics to the wound beds. She has no issues or complaints today. She denies signs of infection. 6/28; patient presents for follow-up. She has been using Keystone antibiotics to the wound beds. She has no issues or complaints today. 7/12; patient presents for follow-up. Has been using Keystone antibiotics to the wound beds with calcium alginate. She has no issues or complaints today. She never followed up with her orthopedic surgeon who did the OR debridement to the right foot. We discussed the total contact cast for the left foot and patient would like to do this next week. 7/19; patient presents for follow-up. She has been using Keystone antibiotics with calcium alginate to the wound beds. She has no issues or complaints today. Patient is in agreement to do the total contact cast of the left foot today. She knows to return later this week for the obligatory cast change. 05-13-2022 upon evaluation today patient's wound which she has the cast of the left leg actually appears to be doing significantly better. Fortunately I do not see any signs of active infection locally or systemically which is great news and overall I am extremely pleased with where we stand currently. 7/26; patient presents for follow-up. She has [name] cast in place for the past week. She states it irritated her shin. Other than that she tolerated the cast well. She states she would like [name] break for 1 week from the cast. We have been using Keystone antibiotic and Aquacel to both wound beds. She denies signs of infection. 8/2; patient presents for  follow-up. She has been using Keystone and Aquacel to the wound beds. She denies any issues and has no complaints. She is agreeable to have the cast placed today for the left leg. 06-03-2022 upon evaluation today patient appears to be doing well with regard to her wound she saw some signs of improvement which is great news. Fortunately I do not see any evidence of active infection locally or systemically at this time which is great news. No fevers, chills, nausea, vomiting, or diarrhea. 8/16; patient presents for follow-up. She has no issues or complaints today. We have been using Keystone and Aquacel to the wound beds. The left lower YERALDI, ALBANI [name] ([id number]) [id number] Page 8 of 24 extremity is in [name] total contact cast. She is tolerated this well. 8/23; patient presents for follow-up. She has had the total contact cast on the left leg for the past week. Unfortunately this has rubbed and broken down the skin to the medial foot. She currently denies signs of infection. She has been using  Keystone antibiotic to the right plantar foot wound. 8/30; patient presents for follow-up. We have held off on the total contact cast for the left leg for the past week. Her wound on the left foot has improved and the previous surrounding breakdown of skin has epithelialized. She has been using Keystone antibiotic to both wound beds. She has no issues or complaints today. She denies signs of infection. 9/6; patient presents for follow-up. She has ordered her's Keystone antibiotic and this is arriving this week. She has been doing Dakin's wet-to-dry dressings to the wound beds. She denies signs of infection. She is agreeable to the total contact cast today. 9/13; patient presents for follow-up. She states that the cast caused her left leg shin to be sore. She would like to take [name] break from the cast this week. She has been using Keystone antibiotic to the right plantar foot wound. She  denies signs of infection. 9/20; patient presents for follow-up. She has been using Keystone antibiotics to the wound beds with calcium alginate to the right foot wound and Hydrofera Blue to the left foot wound. She is agreeable to having the cast placed today. She has been approved for Apligraf and we will order this for next clinic visit. 9/27; patient presents for follow-up. We have been using Keystone antibiotic with Hydrofera Blue to the left foot wound under [name] total contact cast. T the right o foot wound she has been using Keystone antibiotic and calcium alginate. She declines [name] total contact cast today. Apligraf is available for placement and she would like to proceed with this. 07-28-2022 upon evaluation today patient appears to be doing well currently in regard to her wound. She is actually showing signs of significant improvement which is great news. Fortunately I do not see any evidence of active infection locally nor systemically at this time. She has been seeing Dr. Mikey Bussing and to be honest has been doing very well with the cast. Subsequently she comes in today with [name] cast on and we did reapply that today as well. She did not really want to she try to talk me out of that but I explained that if she wanted to heal this is really the right way to go. Patient voiced understanding. In regard to her right foot this is actually [name] lot better compared to the last time I saw her which is also great news. 10/11; patient presents for follow-up. Apligraf and the total contact cast was placed to the left leg at last clinic visit. She states that her right foot wound had burning pain to it with the placement of Apligraf to this area. She has been doing [place] over this area. She denies signs of infection including increased warmth, erythema or purulent drainage. 11/1; 3-week follow-up. The patient fortunately did not have [name] total contact cast or an Apligraf and on the left foot. She has been using  Keystone ABD pads and kerlix and her own running shoes She arrives in clinic today with thick callus and [name] very poor surface on the left foot on the right nonviable skin subcutaneous tissue and [name] deep probing hole. 11/15; patient missed her last clinic appointment. She states she has not been dressing the wound beds for the past 2 weeks. She states that at she had [name] new roommate but is now going back to live with her mother. Apparently its been [name] distracting 2 weeks. Patient currently denies signs of infection. 11/22; patient presents for follow-up. She states she has  been using Keystone antibiotic and Dakin's wet-to-dry dressings to the wound beds. She is agreeable for cast placement today. We had ordered Apligraf however this has not been received by our facility. 11/29; Patient had [name] total contact cast placed at last clinic visit and she tolerated this well. We were using silver alginate under the cast. Patient's been using Keystone antibiotic with Aquacel to the right plantar foot wound. She has no issues or complaints today. Apligraf is available for placement today. Patient would like to proceed with this. 12/6; patient presents for follow-up. She had Apligraf placed in standard fashion last clinic visit under the total contact cast to the left lower extremity. She has been using Keystone antibiotic and Aquacel to the right plantar foot wound. She has no issues or complaints today. 12/13; patient presents for follow-up. She has finished 5 Apligraf placements. Was told she would not qualify for more. We have been doing [name] total contact cast to the left lower extremity. She has been using Keystone antibiotic and Aquacel to the right plantar foot wound. She has no issues or complaints today. 12/20; patient presents for follow-up. We have been using Hydrofera Blue with Keystone antibiotic under [name] total contact cast of the left lower extremity. She reports using Keystone antibiotic and silver alginate  to the right heel wound. She has no issues or complaints today. 12/27; patient presents with [name] healthy wound on the left midfoot. We have Apligraf to apply that to that more also using [name] total contact cast. On the right we are using Keystone and silver alginate. She is offloading the right heel with [name] surgical shoealthough by her admission she is on her feet quite [name] bit 1/3; patient presents for follow-up. Apligraf was placed to the wound beds last clinic visit. She was placed in [name] total contact cast to the left lower extremity. She declines [name] total contact cast today. She states that her mother is in the hospital and she cannot adequately get around with the cast on. 1/10; patient presents for follow-up. She declined the total contact cast at last clinic visit. Both wounds have declined in appearance. She states that she has been on her feet and not offloading the wound beds. She currently denies signs of infection. 1/17; patient presents for follow-up. She had the total contact cast along with Apligraf placed last week to the left lower extremity. She tolerated this well. She has been using Aquacel Ag and Keystone antibiotic to the right heel wound. She currently denies signs of infection. She has no issues or complaints today. 1/24; patient presents for follow-up. We have been using Apligraf to the left foot wound along with [name] total contact cast. She has done well with this. T the right o heel wound she has been using Aquacel Ag and Keystone antibiotic ointment. She has no issues or complaints today. She denies signs of infection. 1/31; patient presents for follow-up. We have been using Apligraf to the left foot wound along with the total contact cast. She continues to do well with this. To the right heel we have been using Aquacel Ag and Keystone antibiotic ointment. She has no issues or complaints today. 12-01-2022 upon evaluation patient is seen today on my schedule due to the fact that she  unfortunately was in the hospital yesterday. Her cast needed to come off the only reason she is out of the hospital is due to the fact that they would not take it off at the hospital which is somewhat  bewildered reading to me to be perfectly honest. I am not certain why this was but either way she was released and then was placed on my schedule today in order to get this off and reapply the total contact casting as appropriate. I do not have an Apligraf for her today it was applied last week and today's actually expired yesterday as there was some scheduling conflicts with her being in the hospital. Nonetheless we do not have that for reapplication today but the good news that she is not draining too much and the Apligraf can go for up to 2 weeks so I am going to go ahead and reapply the total contact casting but we are going to leave the Apligraf in place. 2/14; patient presents for follow-up. T the left leg she has had the total contact cast and Apligraf for the past week. She has had no issues with this. T the o o right heel she has been using antibiotic ointment and Aquacel Ag. 2/21; patient presents for follow-up. We have been using Apligraf and [name] total contact cast to the left lower extremity. She is tolerated this well. Unfortunately she is not approved for any more Apligraf per insurance. She has been using antibiotic ointment and Aquacel Ag to the right foot. She has no issues or complaints today. 2/28; patient presents for follow-up. We have been using Hydrofera Blue and antibiotic ointment under the total contact cast to the left lower extremity. He has been using Aquacel Ag with antibiotic ointment to the right plantar foot. She has no issues or complaints today. 3/6; patient presents for follow-up. She did not obtain her gentamicin ointment. She has been using Aquacel Ag to the right plantar foot wound. We have been KAILIANA, HARRIGAN [name] ([id number]) [id number] Page  9 of 24 using Hydrofera Blue with antibiotic ointment under the total contact cast to the left lower extremity. She has no issues or complaints today. 3/12; patient presents for follow-up. She has been using gentamicin ointment and Aquacel Ag to the right plantar foot wound. We have been using Hydrofera Blue with antibiotic ointment under the total contact cast on the left lower extremity foot wound. She has no issues or complaints today. 3/20; patient presents for follow-up. She has been using gentamicin ointment and Aquacel Ag to the right plantar foot wound. We have been using Hydrofera Blue with antibiotic ointment under the total contact cast to the left lower extremity wound. She has no issues or complaints today. 3/27; patient presents for follow-up. We have been using antibiotic ointment and Aquacel Ag to the right plantar foot wound. We have been using Hydrofera Blue with antibiotic ointment under the total contact cast to the left lower extremity. Both wounds are smaller. 01-24-2023 upon evaluation today patient actually appears to be doing better compared to last time I saw her. It has been several weeks since I last saw her but nonetheless I think the total contact casting is doing [name] good job. Fortunately I do not see any evidence of infection or worsening overall which is great news and in general I do believe that she is moving in the appropriate direction. It has been [name] very long road but nonetheless I feel like she is nearing the end at least in regard to the left foot and even the right foot looks much better to be perfectly honest. 01-31-2023 upon evaluation today patient appears to be doing well currently in regard to her wounds. She has been require some sharp  debridement which I think is probably bone both sides to remove [name] lot of callus that has built up at this point. I discussed that with her today. Also think total contact cast is doing well for the left foot Emina continue that as  well. 4/17; patient presents for follow-up. We have been doing Hydrofera Blue with antibiotic ointment under the total contact cast to the left lower extremity and Aquacel Ag with antibiotic ointment to the right foot wound. She has been using her offloading heel shoe here. 4/24; patient presents for follow-up. We have been doing Hydrofera Blue with antibiotic ointment under the total contact cast to the left lower extremity and Aquacel Ag with antibiotic ointment to the right foot wound. She has been using her offloading heel shoe here. Wounds are smaller with slough accumulation. 5/1; patient presents for follow-up. We have been doing Hydrofera Blue with antibiotic ointment under the total contact cast to the left lower extremity and Aquacel Ag with antibiotic ointment to the right foot wound. Left foot wound is stable and right foot wound appears smaller. 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 On the left she is using Hydrofera Blue topical antibiotic under [name] total contact cast, on the right plantar heel gentamicin Aquacel Ag. According to our intake nurse both are making slow but steady improvements. 5/15; patient presents for follow-up. We have been doing Hydrofera Blue with antibiotic ointment under the total contact cast on the left and antibiotic ointment with Aquacel Ag on the right. Unfortunately the left foot wound has declined in size and appearance. We also do not have the total contact cast available in office today. Patient denies signs of infection. 5/22; patient presents for follow-up. We have been doing Hydrofera Blue and antibiotic ointment to the left foot and Aquacel Ag with antibiotic ointment to the right foot. We took [name] break from the cast since we did not have it placed at last week. Wounds look stable if not slightly improved. 03-27-2023 patient presents today she has had this For Most 2 Weeks Because She Was in the Hospital and They Did Not T It off. Again That Is Really  Not ake Something That She Could Control but Nonetheless We Are Seeing Her at This Point Today for Reevaluation. We Do Want to Go and See about Reapply the Cast. Again We Had All Set Everything up to Go Ahead and Do This When We Went to Get the Cast Only to Realize That Not Had Come in and We Were out at the Moment. With That Being [place] Spaniel We Will Get Have T Get the Exam before We Can Reapply the T Contact Casting Going Forward. o otal 6/12; patient presents for follow-up. She has been using Hydrofera Blue to the left foot wound and Aquacel Ag to the right foot wound. No cast was available last week placed on the patient so she has been offloading with her surgical shoe. She has no issues or complaints today. 6/19; patient presents for follow-up. We have been using Hydrofera Blue to the left foot wound under the total contact cast and Aquacel Ag to the right wound. She has no issues or complaints today. The wounds are smaller. 6/26; patient presents for follow-up. We have been using Hydrofera Blue to the left foot wound under the total contact cast and Aquacel Ag to the right foot wound. Wounds are smaller. Patient has no issues or complaints today. 7/3; patient presents for follow-up. We have been using Hydrofera Blue to the  left foot wound under the total contact cast and Aquacel Ag to the right foot wound. Wounds Appear smaller. No signs of infection. 7/10; patient presents for follow-up. We have been using Hydrofera Blue to the left foot wound under the total contact cast and Aquacel Ag to the right foot wound. Left foot wound is smaller. Right foot wound is stable. 7/17; patient presents for follow-up. We have been using Hydrofera Blue to the left foot under the total contact cast and Aquacel Ag to the right foot wound. She has no issues or complaints today. 7/24; patient presents for follow-up. We have been using Hydrofera Blue to the left foot under the total contact cast and Aquacel Ag to the  right foot wound. Unfortunately she was bit by her dog over the weekend and developed [name] wound to the right lower leg. She has been keeping the area covered. 7/31; patient presents for follow-up. We have been using Hydrofera Blue to the left foot under the total contact cast and Aquacel Ag to the right foot wound and mupirocin ointment to the right anterior leg wound. She started her oral antibiotics 2 days ago. 8/7; patient presents for follow-up. We have been using Hydrofera Blue and antibiotic ointment to the left foot under the total contact cast and Aquacel Ag to the right foot wound. Antibiotic ointment to the right anterior leg wound. She completed her oral antibiotics. She has no issues or complaints today. Wounds continue to improve albeit slowly. Electronic Signature(s) Signed: 05/31/2023 12:18:12 PM By: Geralyn Corwin DO Entered By: Geralyn Corwin on 05/31/2023 12:04:16 [name] ([id number]) [id number] Page 10 of 24 -------------------------------------------------------------------------------- Physical Exam Details Patient Name: Date of Service: FERRARI, ZEHM 05/31/2023 11:00 [name]: [id number] Patient Account Number: [id number] Date of Birth/Sex: Treating RN: 05/07/1976 (47 y.o. [name] Primary Care Provider: [name] Other Clinician: [name] Referring Provider: Treating Provider/Extender: Chapman Moss in Treatment: 75 Constitutional . Cardiovascular . Psychiatric . Notes Right foot: T the plantar heel there is an incision site with increased depth, Granulation tissue and slough at the opening. Left foot: T the medial aspect there o o is an open wound with granulation tissue, Callus and slough build up. No signs of infection to the feet wounds. Right lower extremity: T the anterior aspect o there is an open wound with Granulation tissue throughout. No signs of  infection. Electronic Signature(s) Signed: 05/31/2023 12:18:12 PM By: Geralyn Corwin DO Entered By: Geralyn Corwin on 05/31/2023 12:04:57 -------------------------------------------------------------------------------- Physician Orders Details Patient Name: Date of Service: [name] 05/31/2023 11:00 [name]: [id number] Patient Account Number: [id number] Date of Birth/Sex: Treating RN: May 05, 1976 (47 y.o. [name] Primary Care Provider: [name] Other Clinician: [name] Referring Provider: Treating Provider/Extender: Chapman Moss in Treatment: 53 Verbal / Phone Orders: Yes Clinician: Huel Coventry Read Back and Verified: Yes Diagnosis Coding Follow-up Appointments Return Appointment in 1 week. Bathing/ Shower/ Hygiene May shower with wound dressing protected with water repellent cover or cast protector. No tub bath. Anesthetic (Use 'Patient Medications' Section for Anesthetic Order Entry) Lidocaine applied to wound bed BRIANICA, ASEL [name] ([id number]) [id number] Page 11 of 24 Off-Loading Total Contact Cast to Left Lower Extremity Open toe surgical shoe - right Wound Treatment Wound #11 - Foot Wound Laterality: Plantar, Right Cleanser: Soap and Water 3 x Per Week/30 Days Discharge Instructions: Gently cleanse wound with antibacterial soap, rinse and pat dry prior  to dressing wounds Cleanser: Wound Cleanser 3 x Per Week/30 Days Discharge Instructions: Wash your hands with soap and water. Remove old dressing, discard into plastic bag and place into trash. Cleanse the wound with Wound Cleanser prior to applying [name] clean dressing using gauze sponges, not tissues or cotton balls. Do not scrub or use excessive force. Pat dry using gauze sponges, not tissue or cotton balls. Prim Dressing: Aquacel Extra Hydrofiber Dressing, 2x2 (in/in) ary 3 x Per Week/30 Days Secondary Dressing: ABD Pad 5x9  (in/in) 3 x Per Week/30 Days Discharge Instructions: Cover with ABD pad Secondary Dressing: 3 x Per Week/30 Days Secured With: Medipore T - 30M Medipore H Soft Cloth Surgical T ape ape, 2x2 (in/yd) 3 x Per Week/30 Days Secured With: [employer] or Non-Sterile 6-ply 4.5x4 (yd/yd) 3 x Per Week/30 Days Discharge Instructions: Apply Kerlix as directed Wound #12 - Foot Wound Laterality: Plantar, Left, Medial Cleanser: Soap and Water 1 x Per Week/30 Days Discharge Instructions: Gently cleanse wound with antibacterial soap, rinse and pat dry prior to dressing wounds Cleanser: Wound Cleanser 1 x Per Week/30 Days Discharge Instructions: Wash your hands with soap and water. Remove old dressing, discard into plastic bag and place into trash. Cleanse the wound with Wound Cleanser prior to applying [name] clean dressing using gauze sponges, not tissues or cotton balls. Do not scrub or use excessive force. Pat dry using gauze sponges, not tissue or cotton balls. Topical: Gentamicin 1 x Per Week/30 Days Discharge Instructions: Apply as directed by provider. Topical: Mupirocin Ointment 1 x Per Week/30 Days Discharge Instructions: Apply as directed by provider. Prim Dressing: Hydrofera Blue Ready Transfer Foam, 2.5x2.5 (in/in) 1 x Per Week/30 Days ary Discharge Instructions: Apply Hydrofera Blue Ready to wound bed as directed Secondary Dressing: Gauze 1 x Per Week/30 Days Discharge Instructions: As directed: dry Secondary Dressing: heel cup 1 x Per Week/30 Days Secured With: Medipore T - 30M Medipore H Soft Cloth Surgical T ape ape, 2x2 (in/yd) 1 x Per Week/30 Days Secured With: [employer] or Non-Sterile 6-ply 4.5x4 (yd/yd) 1 x Per Week/30 Days Discharge Instructions: Apply Kerlix as directed Wound #14 - Lower Leg Wound Laterality: Right, Anterior Cleanser: Normal Saline 1 x Per Day Discharge Instructions: Wash your hands with soap and water. Remove old dressing, discard into plastic bag and  place into trash. Cleanse the wound with Normal Saline prior to applying [name] clean dressing using gauze sponges, not tissues or cotton balls. Do not scrub or use excessive force. Pat dry using gauze sponges, not tissue or cotton balls. Topical: Mupirocin Ointment 1 x Per Day Discharge Instructions: Apply as directed by provider. Secondary Dressing: (BORDER) Zetuvit Plus SILICONE BORDER Dressing 4x4 (in/in) 1 x Per Day Discharge Instructions: Please do not put silicone bordered dressings under wraps. Use non-bordered dressing only. Electronic Signature(s) Signed: 05/31/2023 12:18:12 PM By: Geralyn Corwin DO Entered By: Geralyn Corwin on 05/31/2023 12:08:51 Tobey Grim ([id number]) [id number] Page 12 of 24 -------------------------------------------------------------------------------- Problem List Details Patient Name: Date of Service: TAWANYA, [name] 05/31/2023 11:00 [name]: [id number] Patient Account Number: [id number] Date of Birth/Sex: Treating RN: 1976-09-11 (47 y.o. 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/2022 No Yes severity L97.512 Non-pressure chronic ulcer of other  part of right foot with fat layer exposed 03/16/2022 No Yes E11.621 Type 2 diabetes mellitus with foot ulcer 03/16/2022 No Yes L97.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 03/16/2022 No Yes of skin W54.0XXA Bitten by dog, initial encounter 05/17/2023 No Yes S81.801A Unspecified open wound, right lower leg, initial encounter 05/17/2023 No Yes E11.622 Type 2 diabetes mellitus with other skin ulcer 05/17/2023 No  Yes E11.42 Type 2 diabetes mellitus with diabetic polyneuropathy 03/16/2022 No Yes Z79.4 Long term (current) use of insulin 02/22/2023 No Yes Inactive Problems Resolved Problems Electronic Signature(s) Signed: 05/31/2023 12:18:12 PM By: Geralyn Corwin DO Entered By: Geralyn Corwin on 05/31/2023 12:03:28 [name] ([id number]) [id number] Page 13 of 24 -------------------------------------------------------------------------------- Progress Note Details Patient Name: Date of Service: [name], [name] 05/31/2023 11:00 [name]: [id number] Patient Account Number: [id number] Date of Birth/Sex: Treating RN: 01/21/1976 (47 y.o. [name] Primary Care Provider: [name] Other Clinician: [name] Referring Provider: Treating Provider/Extender: Chapman Moss in Treatment: 16 Subjective Chief Complaint Information obtained from Patient 03/19/2021; patient referred by Dr. Excell Seltzer who has been looking after her left foot for quite [name] period of time for review of [name] nonhealing area in the left midfoot 03/12/2022; bilateral feet wounds and right lower extremity wound. History of Present Illness (HPI) 01/18/18-She is here for initial evaluation of the left great toe ulcer. She is [name] poor historian in regards to timeframe in detail. She states approximately 4 weeks ago she lacerated her toe on something in the house. She followed up with her primary care who placed her on Bactrim and ultimately [name] second dose of Bactrim prior to coming to wound clinic. She states she has been treating the toe with peroxide, Betadine and [name] Band-Aid. She did not check her blood sugar this morning but checked it yesterday morning it was 327; she is unaware of [name] recent A1c and there are no current records. She saw Dr. she would've orthopedics last week for an old injury to the left ankle, she states he did not see her toe, nor did she bring it  to his attention. She smokes approximately 1 pack cigarettes [name] day. Her social situation is concerning, she arrives this morning with her mother who appears extremely intoxicated/under the influence; her mother was asked to leave the room and be monitored by the patient's grandmother. The patient's aunt then accompanied the patient and the room throughout the rest of the appointment. We had [name] lengthy discussion regarding the deleterious effects of uncontrolled hyperglycemia and smoking as it relates to wound healing and overall health. She was strongly encouraged to decrease her smoking and get her diabetes under better control. She states she is currently on [name] diet and has cut down her [employer] consumption. The left toe is erythematous, macerated and slightly edematous with malodor present. The edema in her left foot is below her baseline, there is no erythema streaking. We will treat her with Santyl, doxycycline; we have ordered and xray, culture and provided [name] Peg assist surgical shoe and cultured the wound. 01/25/18-She is here in follow-up evaluation for [name] left great toe ulcer and presents with an abscess to her suprapubic area. She states her blood sugars remain elevated, feeling "sick" and if levels are below 250, but she is trying. She has made no attempt to decrease her smoking stating that we "can't take away her food in her cigarettes". She has been compliant  with offloading using the PEG assist you. She is using Santyl daily. the culture obtained last week grew staph aureus and Enterococcus faecalis; continues on the doxycycline and Augmentin was added on Monday. The suprapubic area has erythema, no femoral variation, purple discoloration, minimal induration, was accessed with [name] cotton tip applicator with sanguinopurulent drainage, this was cultured, I suspect the current antibiotic treatment will cover and we will not add anything to her current treatment plan. She was advised to go to  urgent care or ER with any change in redness, induration or fever. 02/01/18-She is here in follow-up evaluation for left great toe ulcers and [name] new abdominal abscess from last week. She was able to use packing until earlier this week, where she "forgot it was there". She states she was feeling ill with GI symptoms last week and was not taking her antibiotic. She states her glucose levels have been predominantly less than 200, with occasional levels between 200-250. She thinks this was contributing to her GI symptoms as they have resolved without intervention. There continues to be significant laceration to left toe, otherwise it clinically looks stable/improved. There is now less superficial opening to the lateral aspect of the great toe that was residual blister. We will transition to [facility] to all wounds, she will continue her Augmentin. If there is no change or deterioration next week for reculture. 02/08/18-She is here in follow-up evaluation for left great toe ulcer and abdominal ulcer. There is an improvement in both wounds. She has been wrapping her left toe with coban, not by our direction, which has created an area of discoloration to the medial aspect; she has been advised to NOT use coban secondary to her neuropathy. She states her glucose levels have been high over this last week ranging from 200-350, she continues to smoke. She admits to being less compliant with her offloading shoe. We will continue with same treatment plan and she will follow-up next week. 02/15/18-She is here in follow-up evaluation for left great toe ulcer and abdominal ulcer. The abdominal ulcer is epithelialized. The left great toe ulcer is improved and all injury from last week using the Coban wrap is resolved, the lateral ulcer is healed. She admits to noncompliance with wearing offloading shoe and admits to glucose levels being greater than 300 most of the week. She continues to smoke and expresses no desire  to quit. There is one area medially that probes deeper than it has historically, erythema to the toe and dorsal foot has consistently waxed and waned. There is no overt signs of cellulitis or infection but we will culture the wound for any occult infection given the new area of depth and erythema. We will hold off on sensitivities for initiation of antibiotic therapy. 02/22/18-She is here in follow up evaluation for left great toe ulcer. There is overall significant improvement in both wound appearance, erythema and edema with changes made last week. She was not initiated on antibiotic therapy. Culture obtained last week showed oxacillin sensitive staph aureus, sensitive to clindamycin. Clindamycin has been called into the pharmacy but she has been instructed to hold off on initiation secondary to overall clinical improvement and her history of antibiotic intolerance. She has been instructed to contact the clinic with any noted changes/deterioration and the wound, erythema, edema and/or pain. She will follow-up next week. She continues to smoke and her glucose levels remain elevated >250; she admits to compliance with offloading shoe 03/01/18 on evaluation today patient appears to be doing fairly  well in regard to her left first toe ulcer. She has been tolerating the dressing changes with the [facility] Dressing without complication and overall this has definitely showed signs of improvement according to records as well is what the patient tells me today. I'm very pleased in that regard. She is having no pain today 03/08/18 She is here for follow up evaluation of [name] left great toe ulcer. She remains non-compliant with glucose control and smoking cessation; glucose levels consistently >200. She states that she got new shoe inserts/peg assist. She admits to compliance with offloading. Since my last evaluation there is significant improvement. We will switch to prisma at this time and she will follow up  next week. She is noted to be tachycardic at this appointment, heart rate 120s; she has [name] history of heart rate 70-130 according to our records. She admits to extreme agitation r/t personal issues; she was advised to monitor her heartrate and contact her physician if it does not return to [name] more normal range (<100). She takes cardizem twice daily. 03/15/18-She is here in follow-up evaluation for left great toe ulcer. She remains noncompliant with glucose control and smoking cessation. She admits to compliance with wearing offloading shoe. The ulcer is improved/stable and we will continue with the same treatment plan and she will follow-up next week 03/22/18-She is here for evaluation for left great toe ulcer. There continues to be significant improvement despite recurrent hyperglycemia (over 500 yesterday) and she continues to smoke. She has been compliant with offloading and we will continue with same treatment plan and she will follow-up next week. 03/29/18-She is here for evaluation for left great toe ulcer. Despite continuing to smoke and uncontrolled diabetes she continues to improve. She is compliant with offloading shoe. We will continue with the same treatment plan and she will follow-up next week 04/05/18- She is here in follow up evaluation for [name] left great toe ulcer; she presents with small pustule to left fifth toe (resembles ant bite). She admits to compliance with wearing offloading shoe; continues to smoke or have uncontrolled blood glucose control. There is more callus than usual with evidence of DALEIZA, [name] [name] ([id number]) [id number] Page 14 of 24 bleeding; she denies known trauma. 04/12/18-She is here for evaluation of left great toe ulcer. Despite noncompliance with glycemic control and smoking she continues to make improvement. She continues to wear offloading shoe. The pustule, that was identified last week, to the left fifth toe is resolved. She will  follow-up in 2 weeks 05/03/18-she is seen in follow-up evaluation for [name] left great toe ulcer. She is compliant with offloading, otherwise noncompliant with glycemic control and smoking. She has plateaued and there is minimal improvement noted. We will transition to [facility], replaced the insert to her surgical shoe and she will follow-up in one week 05/10/18- She is here in follow up evaluation for [name] left great toe ulcer. It appears stable despite measurement change. We will continue with same treatment plan and follow up next week. 05/24/18-She is seen in follow-up evaluation for [name] left great toe ulcer. She remains compliant with offloading, has made significant improvement in her diet, decreasing the amount of sugar/soda. She said her recent A1c was 10.9 which is lower than. She did see [name] diabetic nutritionist/educator yesterday. She continues to smoke. We will continue with the same treatment plan and she'll follow-up next week. 05/31/18- She is seen in follow-up evaluation for left great toe ulcer. She continues to remain compliant with offloading, continues  to make improvement in her diet, increasing her water and decreasing the amount of sugar/soda. She does continue to smoke with no desire to quit. We will apply Prisma to the depth and Hydrofera Blue over. We have not received insurance authorization for oasis. She will follow up next week. 06/07/18-She is seen in follow-up evaluation for left great toe ulcer. It has stalled according to today's measurements although base appears stable. She says she saw [name] diabetic educator yesterday; her average blood sugars are less than 300 which is an improvement for her. She continues to smoke and states "that's my next step" She continues with water over soda. We will order for xray, culture and reinstate ace wrap compression prior to placing apligraf for next week. She is voicing no complaints or concerns. Her dressing will change to iodoflex over the  next week in preparation for apligraf. 06/14/18-She is seen in follow-up evaluation for left great toe ulcer. Plain film x-ray performed last week was negative for osteomyelitis. Wound culture obtained last week grew strep B and OSSA; she is initiated on keflex and cefdinir today; there is erythema to the toe which could be from ace wrap compression, she has [name] history of wrapping too tight and has has been encouraged to maintain ace wraps that we place today. We will hold off on application of apligraf today, will apply next week after antibiotic therapy has been initiated. She admits today that she has resumed taking [name] shower with her foot/toe submerged in water, she has been reminded to keep foot/toe out of the bath water. She will be seen in follow up next week 06/21/18-she is seen in follow-up evaluation for left great toe ulcer. She is tolerating antibiotic therapy with no GI disturbance. The wound is stable. Apligraf was applied today. She has been decreasing her smoking, only had 4 cigarettes yesterday and 1 today. She continues being more compliant in diabetic diet. She will follow-up next week for evaluation of site, if stable will remove at 2 weeks. 06/28/18- She is here in follow up evalution. Apligraf was placed last week, she states the dressing fell off on Tuesday and she was dressing with hydrofera blue. She is healed and will be discharged from the clinic today. She has been instructed to continue with smoking cessation, continue monitoring glucose levels, offloading for an additional 4 weeks and continue with hydrofera blue for additional two weeks for any possible microscopic opening. Readmission: 08/07/18 on evaluation today patient presents for reevaluation concerning the ulcer of her right great toe. She was previously discharged on 06/28/18 healed. Nonetheless she states that this began to show signs of drainage she subsequently went to her primary care provider. Subsequently an x-ray  was performed on 08/01/18 which was negative. The patient was also placed on antibiotics at that time. Fortunately they should have been effective for the infection. Nonetheless she's been experiencing some improvement but still has [name] lot of drainage coming from the wound itself. 08/14/18 on evaluation today patient's wound actually does show signs of improvement in regard to the erythema at this point. She has completed the antibiotics. With that being said we did discuss the possibility of placing her in [name] total contact cast as of today although I think that I may want to give this just [name] little bit more time to ensure nothing recurrence as far as her infection is concerned. I do not want to put in the cast and risk infection at that time if things are not completely  resolved. With that being said she is gonna require some debridement today. 08/21/18 on evaluation today patient actually appears to be doing okay in regard to her toe ulcer. She's been tolerating the dressing changes without complication. With that being said it does appear that she is ready and in fact I think it's appropriate for [place] to go ahead and initiate the total contact cast today. Nonetheless she will require some sharp debridement to prepare the wound for application. Overall I feel like things have been progressing well but we do need to do something to get this to close more readily. 08/24/18 patient seen today for reevaluation after having had the total contact cast applied on Tuesday. She seems to have done very well the wound appears to be doing great and overall I'm pleased with the progress that she's made. There were no abnormal areas of rubbing from the cast on her lower extremity. 08/30/18 on evaluation today patient actually appears to be completely healed in regard to her plantar toe ulcer. She tells me at this point she's been having [name] lot of issues with the cast. She almost fell [name] couple of times the state shall the  step of her dog [name] couple times as well. This is been [name] very frustrating process for her other nonetheless she has completely healed the wound which is excellent news. Overall there does not appear to be the evidence of infection at this time which is great news. 09/11/18 evaluation today patient presents for follow-up concerning her great toe ulcer on the left which has unfortunately reopened since I last saw her which was only [name] couple of weeks ago. Unfortunately she was not able to get in to get the shoe and potentially the AFO that's gonna be necessary due to her left foot drop. She continues with offloading shoe but this is not enough to prevent her from reopening it appears. When we last had her in the total contact cast she did well from [name] healing standpoint but unfortunately the wound reopened as soon as she came out of the cast within just [name] couple of weeks. 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 This is also part of the reason why she falls on [name] regular basis. I just do not believe that is gonna be safe for [place] to reinitiate the total contact cast as last time we had this on she fell 3 times one week which is definitely not normal for her. 09/18/18 upon evaluation today the patient actually appears to be doing about the same in regard to her toe ulcer. She did not contact [employer] as I asked her to even though I had given her the prescription. In fact she actually states that she has no idea where the prescription is. She did apparently call [employer] and they told her that all she needed to do was bring the prescription in order to be able to be seen and work on getting the AFO for her left foot. With all that being said she still does not have an appointment and I'm not sure were things stand that  regard. I will give her [name] new prescription today in order to contact them to get this set up. 09/25/18 on evaluation today patient actually appears to be doing  about the same in regard to her toes ulcer. She does have [name] small areas which seems to have [name] lot of callous buildup around the edge of the wound which is going to need sharp debridement today. She still is waiting to be scheduled for evaluation with [facility] for possibility of an AFO. She states there supposed to call her tomorrow to get this set up. Unfortunately it does appear that her foot specifically the toe area is showing signs of erythema. There does not appear to be any systemic infection which is in these good news. 10/02/18 on evaluation today patient actually appears to be doing about the same in regard to her toe ulcer. This really has not done too well although it's not significantly larger it's also not significantly smaller. She has been tolerating the dressing changes without complication. She actually has her appointment with [facility] and [place] tomorrow to hopefully be measured for obtaining and AFO splint. I think this would be helpful preventing this from reoccurring. We had contemplated starting the cast this week although to be honest I am reluctant to do that as she's been having nausea, vomiting, and seizure activity over the past three days. She has [name] history of seizures and have been told is nothing that can be done for these. With that being said I do believe that along with the seizures have the nausea vomiting which upon further questioning doesn't seem to be the normal for her and makes me concerned for the possibility of infection or something else going on. I discussed this with the patient and her mother during the office visit today. I do not feel the wound is effective but maybe something else. The responses this was "this just happens to her at times and we don't know why". They did not seem to be interested  in going to the hospital to have this checked out further. 10/09/18 on evaluation today patient presents for follow-up concerning her ongoing toe ulcer. She has been tolerating the dressing changes without complication. Fortunately there does not appear to be any evidence of infection which is great news however I do think that the patient would benefit from going ahead for with the total contact cast. She's actually in [name] wheelchair today she tells me that she will use her walker if we initiate the cast. I was very specific about the fact that if we were gonna do the cast I wanted to make sure that she was using the walker in order to prevent any falls. She tells me she does not have stairs that she has to traverse on [name] regular basis at her home. She has not had any seizures since last week again that something that happens to her often she tells me she did talk to [employer] and they said that it may take up to three weeks to get the brace approved for her. Hopefully that will not take that long but nonetheless in the meantime I do think the cast could be of benefit. [name], [name] ([id number]) [id number] Page 15 of 24 10/12/18 on evaluation today patient appears to be doing rather well in regard to her toe ulcer. It's just been [name] few days and already this is significantly improved both as far as overall appearance and size. Fortunately there's no sign of infection. She is here for her first obligatory cast change. 10/19/18 Seen today for follow up and management of left great toe ulcer. Wound continues to show improvement. Noted small open area with  seroussang drainage with palpation. Denies any increased pain or recent fevers during visit. She will continue calcium alginate with offloading shoe. Denies any questions or concerns during visit. 10/26/18 on evaluation today patient appears to be doing about the same as when I last saw her in regard to her wound bed. Fortunately  there does not appear to be any signs of infection. Unfortunately she continues to have [name] breakdown in regard to the toe region any time that she is not in the cast. It takes almost no time at all for this to happen. Nonetheless she still has not heard anything from the brace being made by [employer] as to when exactly this will be available to her. Fortunately there is no signs of infection at this time. 10/30/18 on evaluation today patient presents for application of the total contact cast as we just received him this morning. Fortunately we are gonna be able to apply this to her today which is great news. She continues to have no significant pain which is good news. Overall I do feel like things have been improving while she was the cast is when she doesn't have [name] cast that things get worse. She still has not really heard anything from [employer] regarding her brace. 11/02/18 upon evaluation today patient's wound already appears to be doing significantly better which is good news. Fortunately there does not appear to be any signs of infection also good news. Overall I do think the total contact cast as before is helping to heal this area unfortunately it's just not gonna likely keep the area closed and healed without her getting her brace at least. Again the foot drop is [name] significant issue for her. 11/09/18 on evaluation today patient appears to be doing excellent in regard to her toe ulcer which in fact is completely healed. Fortunately we finally got the situation squared away with the paperwork which was needed to proceed with getting her brace approved by Medicaid. I have filled that out unfortunately that information has been sent to the orthopedic office that I worked at 2 1/2 years ago and not tired Current wound care measures. Fortunately she seems to be doing very well at this time. 11/23/18 on evaluation today patient appears to be doing More Poorly Compared to Last Time I Saw Her. At [facility] She  Had Completely Healed. Currently she is continuing to have issues with reopening. She states that she just found out that the brace was approved through Medicaid now she just has to go get measured in order to have this fitted for her and then made. Subsequently she does not have an appointment for this yet that is going to complicate things we obviously cannot put her back in the cast if we do not have everything measured because they're not gonna be able to measure her foot while she is in the cast. 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 Obviously this is unfortunate and does have me somewhat worried as well. 11/30/18 on evaluation today patient's toe ulcer actually appears to be doing fairly well. The good news is she will be getting her brace in the shoes next week on Wednesday. Hopefully we will be able to get this to heal without having to go back in the cast however she may need the cast in order to get the wound completely heal and then go from there.  Fortunately there's no signs of infection at this time. 12/07/18 on evaluation today patient fortunately did receive her brace and she states she could tell this definitely makes her walk better. With that being said she's been having issues with her toe where she noticed yesterday there was [name] lot of tissue that was loosing off this appears to be much larger than what it was previous. She also states that her leg has been read putting much across the top of her foot just about the ankle although this seems to be receiving somewhat. The total area is still red and appears to be someone infected as best I can tell. She is previously taken Bactrim and that may be [name] good option for her today as well. We are gonna see what I wound culture shows as well and I think that this is definitely appropriate. With that being said outside of the culture I still  need to initiate something in the interim and that's what I'm gonna go ahead and select Bactrim is [name] good option for her. 12/14/18 on evaluation today patient appears to be doing better in regard to her left great toe ulcer as compared to last week's evaluation. There's still some erythema although this is significantly improved which is excellent news. Overall I do believe that she is making good progress is still gonna take some time before she is where I would like her to be from the standpoint of being able to place her back into the total contact cast. Hopefully we will be where we need to be by next week. 12/21/18 on evaluation today patient actually appears to be doing poorly in regard to her toe ulcer. She's been tolerating the dressing changes without complication. Fortunately there's no signs of systemic infection although she does have [name] lot of drainage from the toe ulcer and this does seem to be causing some issues at this point. She does have erythema on the distal portion of her toe that appears to be likely cellulitis. 12/28/18 on evaluation today patient actually appears to be doing [name] little better in my pinion in regard to her toe ulcer. With that being said she still does have some evidence of infection at this time and for her culture she had both E. coli as well as enterococcus as organisms noted on evaluation. For that reason I think that though the Keflex likely has treated the E. coli rather well this has really done nothing for the enterococcus. We are going to have to initiate treatment for this specifically. 01/04/19 on evaluation today patient's toe actually appears to be doing better from the standpoint of infection. She currently would like to see about putting the cash back on I think that this is appropriate as long as she takes care of it and keeps it from getting wet. She is gonna have some drainage we can definitely pass this up with Drawtex and alginate to try to prevent  as much drainage as possible from causing the problems. With that being said I do want to at least try her with the cast between now and Tuesday. If there any issues we can't continue to use it then I will discontinue the use of the cast at that point. 01/08/19 on evaluation today patient actually appears to be doing very well as far as her foot ulcer specifically the great toe on the left is concerned. She did have an area of rubbing on the medial aspect of her left ankle which again  is from the cast. Fortunately there's no signs of infection at this point in this appears to be [name] very slight skin breakdown. The patient tells me she felt it rubbing but didn't think it was that bad. Fortunately there is no signs of active infection at this time which is good news. No fevers, chills, nausea, or vomiting noted at this time. 01/15/19 on evaluation today patient actually appears to be doing well in regard to her toe ulcer. Again as previous she seems to do well and she has the cast on which indicates to me that during the time she doesn't have [name] cast on she's putting way too much pressure on this region. Obviously I think that's gonna be an issue as with the current national emergency concerning the Covid-19 Virus it has been recommended that we discontinue the use of total contact casting by the chief medical officer of our company, Dr. Maurine Minister. The reasoning is that if [name] patient becomes sick and cannot come into have the cast removed they could not just leave this on for an additional two weeks. Obviously the hospitals also do not want to receive patient's who are sick into the emergency department to potentially contaminate the region and spread the Covid-19 Virus among other sick individuals within the hospital system. Therefore at this point we are suspending the use of total contact cast until the current emergency subsides. This was all discussed with the patient today as well. 01/22/19 on evaluation  today patient's wound on her left great toe appears to be doing slightly worse than previously noted last week. She tells me that she has been on this quite [name] bit in fact she tells me she's been awake for 38 straight hours. This is due to the fact that she's having to care for grandparents because nobody else will. She has been taking care of them for five the last seven days since I've seen her they both have dementia his is from [name] stroke and her grandmother's was progressive. Nonetheless she states even her mom who knows her condition and situation has only help two of those days to take care of them she's been taking care of the rest. Fortunately there does not appear to be any signs of active infection in regard to her toe at this point although obviously it doesn't look as good as it did previous. I think this is directly related to her not taking off the pressure and friction by way of taking things easy. Though I completely understand what's going on. 01/29/19 on evaluation today patient's tools are actually appears to be showing some signs of improvement today compared to last week's evaluation as far as not necessarily the overall size of the wound but the fact that she has some new skin growth in between the two ends of the wound opening. Overall I feel like she has done well she states that she had [name] family member give her what sounds to be [name] CAM walker boot which has been helpful as well. 02/05/19 on evaluation today patient's wound bed actually appears to be doing significantly better in regard to her overall appearance of the size of the wound. With that being said she is still having an issue with offloading efficiently enough to get this to close. Apparently there is some signs of infection at this point as well unfortunately. Previously she's done well of Augmentin I really do not see anything that needs to be culture currently but there theme and cellulitis  of the foot that I'm seeing I'm  gonna go ahead and place her on an antibiotic today to try to help clear this up. ABIDA, MURTON [name] ([id number]) [id number] Page 16 of 24 02/12/2019 on evaluation today patient actually appears to be doing poorly in regard to her overall wound status. She tells me she has been using her offloading shoe but actually comes in today wearing her tennis shoe with the AFO brace. Again as I previously discussed with her this is really not sufficient to allow the area to heal appropriately. Nonetheless she continues to be somewhat noncompliant and I do wonder based on what she has told my nurse in the past as to whether or not [name] good portion of this noncompliance may be recreational drug and alcohol related. She has had [name] history of heroin overdose and this was fairly recently in the past couple of months that have been seeing her. Nonetheless overall I feel like her wound looks significantly worse today compared to what it was previous. She still has significant erythema despite the Augmentin I am not sure that this is an appropriate medication for her infection I am also concerned that the infection may have gone down into her bone. 02/19/19 on evaluation today patient actually appears to be doing about the same in regard to her toe ulcer. Unfortunately she continues to show signs of bone exposure and infection at this point. There does not appear to be any evidence of worsening of the infection but I'm also not really sure that it's getting significantly better. She is on the Augmentin which should be sufficient for the Staphylococcus aureus infection that she has at this point. With that being said she may need IV antibiotics to more appropriately treat this. We did have [name] discussion today about hyperbaric option therapy. 02/28/19 on evaluation today patient actually appears to be doing much worse in regard to the wound on her left great toe as compared to even my previous evaluation  last week. Unfortunately this seems to be training in [name] pretty poor direction. Her toe was actually now starting to angle laterally and I can actually see the entire joint area of the proximal portion of the digit where is the distal portion of the digit again is no longer even in contact with the joint line. Unfortunately there's [name] lot more necrotic tissue around the edge and the toe appears to be showing signs of becoming gangrenous in my pinion. I'm very concerned about were things stand at this point. She did see infectious disease and they are planning to send in [name] prescription for Sivextro for her and apparently this has been approved. With that being said I don't think she should avoid taking this but at the same time I'm not sure that it's gonna be sufficient to save her toe at this point. She tells me that she still having to care for grandparents which I think is putting quite [name] bit of strain on her foot and specifically the total area and has caused this to break down even to [name] greater degree than would've otherwise been expected. 03/05/19 on evaluation today patient actually appears to be doing quite well in regard to her toe all things considering. She still has bone exposed but there appears to be much less your thing on overall the appearance of the wound and the toe itself is dramatically improved. She still does have some issues currently obviously with infection she did see vascular as well and there  concerned that her blood flow to the toad. For that reason they are setting up for an angiogram next week. 03/14/19 on evaluation today patient appears to be doing very poor in regard to her toe and specifically in regard to the ulceration and the fact that she's starting to notice the toe was leaning even more towards the lateral aspect and the complete joint is visible on the proximal aspect of the joint. Nonetheless she's also noted [name] significant odor and the tip of the toe is turning  more dark and necrotic appearing. Overall I think she is getting worse not better as far as this is concerned. For that reason I am recommending at this point that she likely needs to be seen for likely amputation. READMISSION 03/19/2021 This is [name] patient that we cared for in this clinic for [name] prolonged period of time in 2019 and 2020 with [name] left foot and left first toe wound. 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 Excell Seltzer. In December 2021 she had an ulcer on her right great toe as well as the fourth and fifth toes. 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 She also had an angiogram at that time and underwent angioplasty of the right anterior tibial artery. In any case she claims that the wound on the right foot is closed I did not look at this today which was probably an oversight although I think that should be done next week. 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 According to Dr. Bernette Redbird last review that she was out of the area being cared for by another physician but recently came back to his attention. 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 [name] culture of this area showed E. coli apparently before she came back to see Dr. Excell Seltzer she was supposed to be receiving antibiotics but she did not really take them. Nor is she offloading this area at all. Finally her last hemoglobin A1c listed in epic was in March 2022 at 14.1 she says things are [name] lot better since then although I am not sure. 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 She had this wound at the time. During this admission she had E. coli sepsis [name] TEE was negative. Past medical history is extensive and includes type 2 diabetes with  peripheral neuropathy cardiomyopathy with an ejection fraction of 33%, hypertension, hyperlipidemia chronic renal failure stage III history of substance abuse with cocaine although she claims to be clean now verified by her mother. She is still [name] heavy cigarette smoker. She has [name] history of bipolar disorder seizure disorder ABI in our clinic was 1.05 6/1; left midfoot in the setting of [name] TMA done previously. Round circular wound with [name] "knuckle" of protruding tissue. The problem is that the knuckle was not attached to any of the surrounding granulation and this probed proximally widely I removed [name] large portion of this tissue. This wound goes with considerable undermining laterally. I do not feel any bone there was no purulence but this is [name] deep wound. 6/8; in spite of the debridement I did last week. She arrives with [name] wound looking exactly the same. [name] protruding "knuckle" of tissue nonadherent to most of the surrounding tissue. There  is considerable depth around this from 6-12 o'clock at 2.7 cm and undermining of 1 cm. This does not look overtly infected and the x- ray I did last week was negative for any osseous abnormalities. We have been using silver collagen 6/15; deep tissue culture I did last week showed moderate staph aureus and moderate Pseudomonas. This will definitely require prolonged antibiotic therapy. The pathology on the protuberant area was negative for malignancy fungus etc. 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 We have been using silver collagen. I am going to be prescribing Levaquin for 2 weeks. Her CT scan of the foot is down for 7/5 6/22; CT scan of the foot on 7 5. She says she has hardware in the left leg from her previous fracture. She is on the Levaquin for the deep tissue culture I did that showed methicillin sensitive staph aureus and Pseudomonas. I gave her [name] 2-week supply and she will have another week. She  arrives in clinic today with the same protuberant tissue however this is nonadherent to the tissue surrounding it. I am really at [name] loss to explain this unless there is underlying deep tissue infection 6/29; patient presents for 1 week follow-up. She has been using collagen to the wound bed. She reports taking her antibiotics as prescribed.She has no complaints or issues today. She denies signs of infection. 7/6; patient presents for one week followup. She has been using collagen to the wound bed. She states she is taking Levaquin however at times she is not able to keep it down. She denies signs of infection. 7/13; patient presents for 1 week follow-up. She has been using silver alginate to the wound bed. She still has nausea when taking her antibiotics. She denies signs of infection. 7/20; patient presents for 1 week follow-up. She has been using silver alginate with gentamicin cream to the wound bed. She denies any issues and has no complaints today. She denies signs of infection. 7/27; patient presents for 1 week follow-up. She continues to use silver alginate with gentamicin cream to the wound bed. She reports starting her antibiotics. She has no issues or complaints. Overall she reports stability to the wound. 8/3; patient presents for 1 week follow-up. She has been using silver alginate with gentamicin cream to the wound bed. She reports completing all antibiotics. She has no issues or complaints today. She denies signs of infection. 8/17; patient presents for 2-week follow-up. He is to use silver alginate to the wound bed. She has no issues or complaints today. She denies signs of infection. She reports her pain has improved in her foot since last clinic visit 8/24; patient presents for 1 week follow-up. She continues to use silver alginate to the wound bed. She has no issues or complaints. She denies signs of infection. Pain is stable. 9/7; patient presents for follow-up. She missed her  last week appointment due to feeling ill. She continues to use silver alginate. She has [name] new wound to the right lower extremity that is covered in eschar. She states It occurred over the past week and has no idea how it started. She currently denies signs of infection. 9/14; patient presents for follow-up. T the left foot wound she has been using gentamicin cream and silver alginate. T the right lower extremity wound she has o o been keeping this covered and has not obtain Santyl. 9/21; patient presents for follow-up. She reports using gentamicin cream and silver alginate to the  left foot and Santyl to the right lower extremity wound. She has no issues or complaints today. She denies signs of infection. RASHAWNDA, SAMFORD [name] ([id number]) [id number] Page 17 of 24 9/28; patient presents for follow-up. She reports [name] new wound to her right heel. She states this occurred [name] few days ago and is progressively gotten worse. She has been trying to clean the area with [name] Q-tip and Santyl. She reports stability in the other 2 wounds. She has been using gentamicin cream and silver alginate to the left foot and Santyl to the right lower extremity wound. 10/12; patient presents for follow-up. She reports improvement to the wound beds. She is seeing vein and vascular to discuss the potential of [name] left BKA. She states they are going to do an arteriogram. She continues to use silver alginate with dressing changes to her wounds. 11/2; patient presents for follow-up. She states she has not been doing dressing changes to the wound beds. She states she is not able to offload the areas. She reports chronic pain to her left foot wound. 11/9; patient presents for follow-up. She came in with only socks on. She states she forgot to put on shoes. It is unclear if she is doing any dressing changes. She currently denies systemic signs of infection. 11/16; patient presents for follow-up. She came again  only with socks on. She states she does not wear shoes ever. It is unclear if she does dressing changes. She currently denies systemic signs of infection. 11/23; patient presents for follow-up. She wore her shoes today. It still unclear exactly what dressing she is using for each wound but she did states she obtained Dakin's solution and has been using this to the left foot wound. She currently denies signs of infection. 11/30; patient presents for follow-up. She has no issues or complaints today. She currently denies signs of infection. 12/7; patient presents for follow-up. She has no issues or complaints today. She has been using Hydrofera Blue to the right heel wound and Dakin solution to the left foot wound. Her right anterior leg wound is healed. She currently denies signs of infection. 12/14; patient presents for follow-up. She has been using Hydrofera Blue to the right heel and Dakin's to the left foot wounds. She has no issues or complaints today. She denies signs of infection. 12/21; patient presents for follow-up. She reports using Hydrofera Blue to the right heel and Dakin's to the left foot wound. She denies signs of infection. 12/28; patient presents for follow-up. She continues to use Dakin's to the left foot wound and Hydrofera Blue to the right heel wound. She denies signs of infection. 1/4; patient presents for follow-up. She has no issues or complaints today. She denies signs of infection. 1/11; patient presents for follow-up. It is unclear if she has been dressing these wounds over the past week. She currently denies signs of infection. 1/18; patient presents for follow-up. She states she has been using Dakin's wet-to-dry dressings to the left foot. She has been using Hydrofera Blue to the right foot foot wound. She states that the anterior right leg wound has reopened and draining serous fluid. She denies signs of infection. 1/25; patient presents for follow-up. She has no issues  or complaints today. 2/1; patient presents for follow-up. She has no issues or complaints today. She denies signs of infection. 2/8; patient presents for follow-up. She has lost her surgical shoes. She did not have [name] dressing to the right heel wound. She currently denies signs  of infection. 2/15; patient presents for follow-up. She reports more pain to the right heel today. She denies purulent drainage Or fever/chills 2/22; patient presents for follow-up. She reports taking clindamycin over the past week. She states that she continues to have pain to her right heel. She reports purulent drainage. Readmission 03/16/2022 Ms. Martell Racca is [name] 47 year old female with [name] past medical history of type 2 diabetes, osteomyelitis to her feet, chronic systolic heart failure and bipolar disorder that presents to the clinic for bilateral feet wounds and right lower extremity wound. She was last seen in our clinic on 12/15/2021. At that time she had purulent drainage coming out of her right plantar foot and I recommended she go to the ED. She states she went to [facility] and has been there for the past 3 months. I cannot see the records. She states she had OR debridement and was on several weeks of IV antibiotics while inpatient. Since discharge she has not been taking care of the wound beds. She had nothing on her feet other than socks today. She currently denies signs of infection. 5/31; patient presents for follow-up. She has been using Dakin's wet-to-dry dressings to the wound beds on her feet bilaterally and antibiotic ointment to the right anterior leg wound. She had [name] wound culture done at last clinic visit that showed moderate Pseudomonas aeruginosa sensitive to ciprofloxacin. She currently denies systemic signs of infection. 6/14; patient presents for follow-up. She received Keystone 5 days ago and has been using this on the wound beds. She states that last week she had to go to the hospital  because she had increased warmth and erythema to the right foot. She was started on 2 oral antibiotics. She states she has been taking these. She currently denies systemic signs of infection. She has no issues or complaints today. 6/21; patient presents for follow-up. She states she has been using Keystone antibiotics to the wound beds. She has no issues or complaints today. She denies signs of infection. 6/28; patient presents for follow-up. She has been using Keystone antibiotics to the wound beds. She has no issues or complaints today. 7/12; patient presents for follow-up. Has been using Keystone antibiotics to the wound beds with calcium alginate. She has no issues or complaints today. She never followed up with her orthopedic surgeon who did the OR debridement to the right foot. We discussed the total contact cast for the left foot and patient would like to do this next week. 7/19; patient presents for follow-up. She has been using Keystone antibiotics with calcium alginate to the wound beds. She has no issues or complaints today. Patient is in agreement to do the total contact cast of the left foot today. She knows to return later this week for the obligatory cast change. 05-13-2022 upon evaluation today patient's wound which she has the cast of the left leg actually appears to be doing significantly better. Fortunately I do not see any signs of active infection locally or systemically which is great news and overall I am extremely pleased with where we stand currently. 7/26; patient presents for follow-up. She has [name] cast in place for the past week. She states it irritated her shin. Other than that she tolerated the cast well. She states she would like [name] break for 1 week from the cast. We have been using Keystone antibiotic and Aquacel to both wound beds. She denies signs of infection. 8/2; patient presents for follow-up. She has been using [place] and  Aquacel to the wound beds. She denies any  issues and has no complaints. She is agreeable to have the cast placed today for the left leg. 06-03-2022 upon evaluation today patient appears to be doing well with regard to her wound she saw some signs of improvement which is great news. Fortunately I do not see any evidence of active infection locally or systemically at this time which is great news. No fevers, chills, nausea, vomiting, or diarrhea. OLUWATOYIN, ARTUSO [name] ([id number]) [id number] Page 18 of 24 8/16; patient presents for follow-up. She has no issues or complaints today. We have been using Keystone and Aquacel to the wound beds. The left lower extremity is in [name] total contact cast. She is tolerated this well. 8/23; patient presents for follow-up. She has had the total contact cast on the left leg for the past week. Unfortunately this has rubbed and broken down the skin to the medial foot. She currently denies signs of infection. She has been using Keystone antibiotic to the right plantar foot wound. 8/30; patient presents for follow-up. We have held off on the total contact cast for the left leg for the past week. Her wound on the left foot has improved and the previous surrounding breakdown of skin has epithelialized. She has been using Keystone antibiotic to both wound beds. She has no issues or complaints today. She denies signs of infection. 9/6; patient presents for follow-up. She has ordered her's Keystone antibiotic and this is arriving this week. She has been doing Dakin's wet-to-dry dressings to the wound beds. She denies signs of infection. She is agreeable to the total contact cast today. 9/13; patient presents for follow-up. She states that the cast caused her left leg shin to be sore. She would like to take [name] break from the cast this week. She has been using Keystone antibiotic to the right plantar foot wound. She denies signs of infection. 9/20; patient presents for follow-up. She has been using  Keystone antibiotics to the wound beds with calcium alginate to the right foot wound and Hydrofera Blue to the left foot wound. She is agreeable to having the cast placed today. She has been approved for Apligraf and we will order this for next clinic visit. 9/27; patient presents for follow-up. We have been using Keystone antibiotic with Hydrofera Blue to the left foot wound under [name] total contact cast. T the right o foot wound she has been using Keystone antibiotic and calcium alginate. She declines [name] total contact cast today. Apligraf is available for placement and she would like to proceed with this. 07-28-2022 upon evaluation today patient appears to be doing well currently in regard to her wound. She is actually showing signs of significant improvement which is great news. Fortunately I do not see any evidence of active infection locally nor systemically at this time. She has been seeing Dr. Mikey Bussing and to be honest has been doing very well with the cast. Subsequently she comes in today with [name] cast on and we did reapply that today as well. She did not really want to she try to talk me out of that but I explained that if she wanted to heal this is really the right way to go. Patient voiced understanding. In regard to her right foot this is actually [name] lot better compared to the last time I saw her which is also great news. 10/11; patient presents for follow-up. Apligraf and the total contact cast was placed to the left leg at last clinic  visit. She states that her right foot wound had burning pain to it with the placement of Apligraf to this area. She has been doing [place] over this area. She denies signs of infection including increased warmth, erythema or purulent drainage. 11/1; 3-week follow-up. The patient fortunately did not have [name] total contact cast or an Apligraf and on the left foot. She has been using Keystone ABD pads and kerlix and her own running shoes She arrives in clinic today  with thick callus and [name] very poor surface on the left foot on the right nonviable skin subcutaneous tissue and [name] deep probing hole. 11/15; patient missed her last clinic appointment. She states she has not been dressing the wound beds for the past 2 weeks. She states that at she had [name] new roommate but is now going back to live with her mother. Apparently its been [name] distracting 2 weeks. Patient currently denies signs of infection. 11/22; patient presents for follow-up. She states she has been using Keystone antibiotic and Dakin's wet-to-dry dressings to the wound beds. She is agreeable for cast placement today. We had ordered Apligraf however this has not been received by our facility. 11/29; Patient had [name] total contact cast placed at last clinic visit and she tolerated this well. We were using silver alginate under the cast. Patient's been using Keystone antibiotic with Aquacel to the right plantar foot wound. She has no issues or complaints today. Apligraf is available for placement today. Patient would like to proceed with this. 12/6; patient presents for follow-up. She had Apligraf placed in standard fashion last clinic visit under the total contact cast to the left lower extremity. She has been using Keystone antibiotic and Aquacel to the right plantar foot wound. She has no issues or complaints today. 12/13; patient presents for follow-up. She has finished 5 Apligraf placements. Was told she would not qualify for more. We have been doing [name] total contact cast to the left lower extremity. She has been using Keystone antibiotic and Aquacel to the right plantar foot wound. She has no issues or complaints today. 12/20; patient presents for follow-up. We have been using Hydrofera Blue with Keystone antibiotic under [name] total contact cast of the left lower extremity. She reports using Keystone antibiotic and silver alginate to the right heel wound. She has no issues or complaints today. 12/27; patient  presents with [name] healthy wound on the left midfoot. We have Apligraf to apply that to that more also using [name] total contact cast. On the right we are using Keystone and silver alginate. She is offloading the right heel with [name] surgical shoealthough by her admission she is on her feet quite [name] bit 1/3; patient presents for follow-up. Apligraf was placed to the wound beds last clinic visit. She was placed in [name] total contact cast to the left lower extremity. She declines [name] total contact cast today. She states that her mother is in the hospital and she cannot adequately get around with the cast on. 1/10; patient presents for follow-up. She declined the total contact cast at last clinic visit. Both wounds have declined in appearance. She states that she has been on her feet and not offloading the wound beds. She currently denies signs of infection. 1/17; patient presents for follow-up. She had the total contact cast along with Apligraf placed last week to the left lower extremity. She tolerated this well. She has been using Aquacel Ag and Keystone antibiotic to the right heel wound. She currently denies signs  of infection. She has no issues or complaints today. 1/24; patient presents for follow-up. We have been using Apligraf to the left foot wound along with [name] total contact cast. She has done well with this. T the right o heel wound she has been using Aquacel Ag and Keystone antibiotic ointment. She has no issues or complaints today. She denies signs of infection. 1/31; patient presents for follow-up. We have been using Apligraf to the left foot wound along with the total contact cast. She continues to do well with this. To the right heel we have been using Aquacel Ag and Keystone antibiotic ointment. She has no issues or complaints today. 12-01-2022 upon evaluation patient is seen today on my schedule due to the fact that she unfortunately was in the hospital yesterday. Her cast needed to come off the only  reason she is out of the hospital is due to the fact that they would not take it off at the hospital which is somewhat bewildered reading to me to be perfectly honest. I am not certain why this was but either way she was released and then was placed on my schedule today in order to get this off and reapply the total contact casting as appropriate. I do not have an Apligraf for her today it was applied last week and today's actually expired yesterday as there was some scheduling conflicts with her being in the hospital. Nonetheless we do not have that for reapplication today but the good news that she is not draining too much and the Apligraf can go for up to 2 weeks so I am going to go ahead and reapply the total contact casting but we are going to leave the Apligraf in place. 2/14; patient presents for follow-up. T the left leg she has had the total contact cast and Apligraf for the past week. She has had no issues with this. T the o o right heel she has been using antibiotic ointment and Aquacel Ag. 2/21; patient presents for follow-up. We have been using Apligraf and [name] total contact cast to the left lower extremity. She is tolerated this well. Unfortunately she is not approved for any more Apligraf per insurance. She has been using antibiotic ointment and Aquacel Ag to the right foot. She has no issues or complaints today. 2/28; patient presents for follow-up. We have been using Hydrofera Blue and antibiotic ointment under the total contact cast to the left lower extremity. He has been using Aquacel Ag with antibiotic ointment to the right plantar foot. She has no issues or complaints today. ROSEBELL, SUMMEROUR [name] ([id number]) [id number] Page 19 of 24 3/6; patient presents for follow-up. She did not obtain her gentamicin ointment. She has been using Aquacel Ag to the right plantar foot wound. We have been using Hydrofera Blue with antibiotic ointment under the total contact  cast to the left lower extremity. She has no issues or complaints today. 3/12; patient presents for follow-up. She has been using gentamicin ointment and Aquacel Ag to the right plantar foot wound. We have been using Hydrofera Blue with antibiotic ointment under the total contact cast on the left lower extremity foot wound. She has no issues or complaints today. 3/20; patient presents for follow-up. She has been using gentamicin ointment and Aquacel Ag to the right plantar foot wound. We have been using Hydrofera Blue with antibiotic ointment under the total contact cast to the left lower extremity wound. She has no issues or complaints today. 3/27; patient presents for  follow-up. We have been using antibiotic ointment and Aquacel Ag to the right plantar foot wound. We have been using Hydrofera Blue with antibiotic ointment under the total contact cast to the left lower extremity. Both wounds are smaller. 01-24-2023 upon evaluation today patient actually appears to be doing better compared to last time I saw her. It has been several weeks since I last saw her but nonetheless I think the total contact casting is doing [name] good job. Fortunately I do not see any evidence of infection or worsening overall which is great news and in general I do believe that she is moving in the appropriate direction. It has been [name] very long road but nonetheless I feel like she is nearing the end at least in regard to the left foot and even the right foot looks much better to be perfectly honest. 01-31-2023 upon evaluation today patient appears to be doing well currently in regard to her wounds. She has been require some sharp debridement which I think is probably bone both sides to remove [name] lot of callus that has built up at this point. I discussed that with her today. Also think total contact cast is doing well for the left foot Emina continue that as well. 4/17; patient presents for follow-up. We have been doing Hydrofera Blue  with antibiotic ointment under the total contact cast to the left lower extremity and Aquacel Ag with antibiotic ointment to the right foot wound. She has been using her offloading heel shoe here. 4/24; patient presents for follow-up. We have been doing Hydrofera Blue with antibiotic ointment under the total contact cast to the left lower extremity and Aquacel Ag with antibiotic ointment to the right foot wound. She has been using her offloading heel shoe here. Wounds are smaller with slough accumulation. 5/1; patient presents for follow-up. We have been doing Hydrofera Blue with antibiotic ointment under the total contact cast to the left lower extremity and Aquacel Ag with antibiotic ointment to the right foot wound. Left foot wound is stable and right foot wound appears smaller. 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 On the left she is using Hydrofera Blue topical antibiotic under [name] total contact cast, on the right plantar heel gentamicin Aquacel Ag. According to our intake nurse both are making slow but steady improvements. 5/15; patient presents for follow-up. We have been doing Hydrofera Blue with antibiotic ointment under the total contact cast on the left and antibiotic ointment with Aquacel Ag on the right. Unfortunately the left foot wound has declined in size and appearance. We also do not have the total contact cast available in office today. Patient denies signs of infection. 5/22; patient presents for follow-up. We have been doing Hydrofera Blue and antibiotic ointment to the left foot and Aquacel Ag with antibiotic ointment to the right foot. We took [name] break from the cast since we did not have it placed at last week. Wounds look stable if not slightly improved. 03-27-2023 patient presents today she has had this For Most 2 Weeks Because She Was in the Hospital and They Did Not T It off. Again That Is Really Not ake Something That She Could Control but Nonetheless We Are Seeing Her at  This Point Today for Reevaluation. We Do Want to Go and See about Reapply the Cast. Again We Had All Set Everything up to Go Ahead and Do This When We Went to Get the Cast Only to Realize That Not Had Come in and We Were  out at the Moment. With That Being [place] Spaniel We Will Get Have T Get the Exam before We Can Reapply the T Contact Casting Going Forward. o otal 6/12; patient presents for follow-up. She has been using Hydrofera Blue to the left foot wound and Aquacel Ag to the right foot wound. No cast was available last week placed on the patient so she has been offloading with her surgical shoe. She has no issues or complaints today. 6/19; patient presents for follow-up. We have been using Hydrofera Blue to the left foot wound under the total contact cast and Aquacel Ag to the right wound. She has no issues or complaints today. The wounds are smaller. 6/26; patient presents for follow-up. We have been using Hydrofera Blue to the left foot wound under the total contact cast and Aquacel Ag to the right foot wound. Wounds are smaller. Patient has no issues or complaints today. 7/3; patient presents for follow-up. We have been using Hydrofera Blue to the left foot wound under the total contact cast and Aquacel Ag to the right foot wound. Wounds Appear smaller. No signs of infection. 7/10; patient presents for follow-up. We have been using Hydrofera Blue to the left foot wound under the total contact cast and Aquacel Ag to the right foot wound. Left foot wound is smaller. Right foot wound is stable. 7/17; patient presents for follow-up. We have been using Hydrofera Blue to the left foot under the total contact cast and Aquacel Ag to the right foot wound. She has no issues or complaints today. 7/24; patient presents for follow-up. We have been using Hydrofera Blue to the left foot under the total contact cast and Aquacel Ag to the right foot wound. Unfortunately she was bit by her dog over the weekend and  developed [name] wound to the right lower leg. She has been keeping the area covered. 7/31; patient presents for follow-up. We have been using Hydrofera Blue to the left foot under the total contact cast and Aquacel Ag to the right foot wound and mupirocin ointment to the right anterior leg wound. She started her oral antibiotics 2 days ago. 8/7; patient presents for follow-up. We have been using Hydrofera Blue and antibiotic ointment to the left foot under the total contact cast and Aquacel Ag to the right foot wound. Antibiotic ointment to the right anterior leg wound. She completed her oral antibiotics. She has no issues or complaints today. Wounds continue to improve albeit slowly. Objective Constitutional Vitals Time Taken: 10:57 AM, Height: 69 in, Weight: 178 lbs, BMI: 26.3, Temperature: 98.3 F, Pulse: 71 bpm, Respiratory Rate: 16 breaths/min, Blood Pressure: 176/89 mmHg. General Notes: Right foot: T the plantar heel there is an incision site with increased depth, Granulation tissue and slough at the opening. Left foot: T the o o medial aspect there is an open wound with granulation tissue, Callus and slough build up. No signs of infection to the feet wounds. Right lower extremity: T the SHYLEIGH, IBRAHIM [name] ([id number]) [id number] Page 20 of 24 anterior aspect there is an open wound with Granulation tissue throughout. No signs of infection. Integumentary (Hair, Skin) Wound #11 status is Open. Original cause of wound was Surgical Injury. The date acquired was: 12/01/2021. The wound has been in treatment 63 weeks. The wound is located on the Right,Plantar Foot. The wound measures 0.1cm length x 0.1cm width x 0.4cm depth; 0.008cm^2 area and 0.003cm^3 volume. There is Fat Layer (Subcutaneous Tissue) exposed. There is [name] small amount of serous  drainage noted. The wound margin is distinct with the outline attached to the wound base. There is small (1-33%) pink, pale granulation  within the wound bed. There is [name] small (1-33%) amount of necrotic tissue within the wound bed including Adherent Slough. Wound #12 status is Open. Original cause of wound was Pressure Injury. The date acquired was: 03/16/2020. The wound has been in treatment 63 weeks. The wound is located on the Left,Medial,Plantar Foot. The wound measures 0.3cm length x 0.1cm width x 0.1cm depth; 0.024cm^2 area and 0.002cm^3 volume. There is Fat Layer (Subcutaneous Tissue) exposed. There is [name] medium amount of serosanguineous drainage noted. The wound margin is flat and intact. There is large (67-100%) pink, pale granulation within the wound bed. There is no necrotic tissue within the wound bed. Wound #14 status is Open. Original cause of wound was Bite. The date acquired was: 05/14/2023. The wound has been in treatment 2 weeks. The wound is located on the Right,Anterior Lower Leg. The wound measures 1.4cm length x 0.6cm width x 0.1cm depth; 0.66cm^2 area and 0.066cm^3 volume. There is Fat Layer (Subcutaneous Tissue) exposed. There is [name] small amount of serosanguineous drainage noted. The wound margin is distinct with the outline attached to the wound base. There is no granulation within the wound bed. There is [name] large (67-100%) amount of necrotic tissue within the wound bed including Adherent Slough. 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 I debrided nonviable tissue.  Again Hydrofera Blue and antibiotic ointment was placed to the left leg under the total contact cast. Continue Aquacel Ag to the right heel wound. Continue antibiotic ointment to the right anterior leg. Follow-up in 1 week. Procedures Wound #11 Pre-procedure diagnosis of Wound #11 is an Open Surgical Wound located on the Right,Plantar Foot . There was [name] Selective/Open Wound Skin/Epidermis Debridement with [name] total area of 0.03 sq cm performed by Geralyn Corwin, MD. With the following instrument(s): Curette to remove Viable and Non-Viable tissue/material. Material removed includes Callus, Skin: Dermis, and Skin: Epidermis. [name] time out was conducted at 11:28, prior to the start of the procedure. [name] Minimum amount of bleeding was controlled with Pressure. The procedure was tolerated well. Post Debridement Measurements: 0.2cm length x 0.2cm width x 0.4cm depth; 0.013cm^3 volume. Character of Wound/Ulcer Post Debridement is improved. Post procedure Diagnosis Wound #11: Same as Pre-Procedure Wound #12 Pre-procedure diagnosis of Wound #12 is [name] Diabetic Wound/Ulcer of the Lower Extremity located on the Left,Medial,Plantar Foot .Severity of Tissue Pre Debridement is: Fat layer exposed. There was [name] Selective/Open Wound Skin/Epidermis Debridement with [name] total area of 0.02 sq cm performed by Geralyn Corwin, MD. With the following instrument(s): Curette to remove Viable and Non-Viable tissue/material. Material removed includes Callus, Slough, Skin: Dermis, and Skin: Epidermis. [name] time out was conducted at 11:25, prior to the start of the procedure. [name] Minimum amount of bleeding was controlled with Pressure. The procedure was tolerated well. Post Debridement Measurements: 0.3cm length x 0.1cm width x 0.1cm depth; 0.002cm^3 volume. Character of Wound/Ulcer Post Debridement  is improved. Severity of Tissue Post Debridement is: Fat layer exposed. Post procedure Diagnosis Wound #12: Same as  Pre-Procedure Pre-procedure diagnosis of Wound #12 is [name] Diabetic Wound/Ulcer of the Lower Extremity located on the Left,Medial,Plantar Foot . There was [name] T Contact otal Cast Procedure by Geralyn Corwin, MD. Post procedure Diagnosis Wound #12: Same as Pre-Procedure Plan Follow-up Appointments: Return Appointment in 1 week. Bathing/ Shower/ Hygiene: May shower with wound dressing protected with water repellent cover or cast protector. No tub bath. Anesthetic (Use 'Patient Medications' Section for Anesthetic Order Entry): Lidocaine applied to wound bed Off-Loading: JESSA, BASORE [name] ([id number]) [id number] Page 21 of 24 T Contact Cast to Left Lower Extremity otal Open toe surgical shoe - right WOUND #11: - Foot Wound Laterality: Plantar, Right Cleanser: Soap and Water 3 x Per Week/30 Days Discharge Instructions: Gently cleanse wound with antibacterial soap, rinse and pat dry prior to dressing wounds Cleanser: Wound Cleanser 3 x Per Week/30 Days Discharge Instructions: Wash your hands with soap and water. Remove old dressing, discard into plastic bag and place into trash. Cleanse the wound with Wound Cleanser prior to applying [name] clean dressing using gauze sponges, not tissues or cotton balls. Do not scrub or use excessive force. Pat dry using gauze sponges, not tissue or cotton balls. Prim Dressing: Aquacel Extra Hydrofiber Dressing, 2x2 (in/in) 3 x Per Week/30 Days ary Secondary Dressing: ABD Pad 5x9 (in/in) 3 x Per Week/30 Days Discharge Instructions: Cover with ABD pad Secondary Dressing: 3 x Per Week/30 Days Secured With: Medipore T - 9M Medipore H Soft Cloth Surgical T ape ape, 2x2 (in/yd) 3 x Per Week/30 Days Secured With: [employer] or Non-Sterile 6-ply 4.5x4 (yd/yd) 3 x Per Week/30 Days Discharge Instructions: Apply Kerlix as directed WOUND #12: - Foot Wound Laterality: Plantar, Left, Medial Cleanser: Soap and Water 1 x Per Week/30  Days Discharge Instructions: Gently cleanse wound with antibacterial soap, rinse and pat dry prior to dressing wounds Cleanser: Wound Cleanser 1 x Per Week/30 Days Discharge Instructions: Wash your hands with soap and water. Remove old dressing, discard into plastic bag and place into trash. Cleanse the wound with Wound Cleanser prior to applying [name] clean dressing using gauze sponges, not tissues or cotton balls. Do not scrub or use excessive force. Pat dry using gauze sponges, not tissue or cotton balls. Topical: Gentamicin 1 x Per Week/30 Days Discharge Instructions: Apply as directed by provider. Topical: Mupirocin Ointment 1 x Per Week/30 Days Discharge Instructions: Apply as directed by provider. Prim Dressing: Hydrofera Blue Ready Transfer Foam, 2.5x2.5 (in/in) 1 x Per Week/30 Days ary Discharge Instructions: Apply Hydrofera Blue Ready to wound bed as directed Secondary Dressing: Gauze 1 x Per Week/30 Days Discharge Instructions: As directed: dry Secondary Dressing: heel cup 1 x Per Week/30 Days Secured With: Medipore T - 9M Medipore H Soft Cloth Surgical T ape ape, 2x2 (in/yd) 1 x Per Week/30 Days Secured With: [employer] or Non-Sterile 6-ply 4.5x4 (yd/yd) 1 x Per Week/30 Days Discharge Instructions: Apply Kerlix as directed WOUND #14: - Lower Leg Wound Laterality: Right, Anterior Cleanser: Normal Saline 1 x Per Day/ Discharge Instructions: Wash your hands with soap and water. Remove old dressing, discard into plastic bag and place into trash. Cleanse the wound with Normal Saline prior to applying [name] clean dressing using gauze sponges, not tissues or cotton balls. Do not scrub or use excessive force. Pat dry using gauze sponges, not tissue or cotton balls. Topical: Mupirocin Ointment 1 x Per  Day/ Discharge Instructions: Apply as directed by provider. Secondary Dressing: (BORDER) Zetuvit Plus SILICONE BORDER Dressing 4x4 (in/in) 1 x Per Day/ Discharge Instructions: Please do  not put silicone bordered dressings under wraps. Use non-bordered dressing only. 1. In office sharp debridement 2. Hydrofera Blue with antibiotic ointment 3. T contact cast placed in standard fashion to the left lower extremity otal 4. Aquacel Ag to the right foot wound 5. Mupirocin ointment to the right lower extremity wound 6. Continue aggressive offloadingright offloading heel shoe 7. Follow-up in 1 week Electronic Signature(s) Signed: 05/31/2023 12:18:12 PM By: Geralyn Corwin DO Entered By: Geralyn Corwin on 05/31/2023 12:07:11 -------------------------------------------------------------------------------- ROS/PFSH Details Patient Name: Date of Service: [name], Javiana [name]. 05/31/2023 11:00 [name]: [id number] Patient Account Number: [id number] Date of Birth/Sex: Treating RN: 03-Nov-1975 (47 y.o. [name] Primary Care Provider: [name] Other Clinician: [name] Referring Provider: Treating Provider/Extender: Chapman Moss in Treatment: [address] Information Obtained From [employer] [name] ([id number]) [id number] Page 22 of 24 Patient Ear/Nose/Mouth/Throat Medical History: Positive for: Chronic sinus problems/congestion; Middle ear problems Hematologic/Lymphatic Medical History: Positive for: Anemia Respiratory Medical History: Positive for: Chronic Obstructive Pulmonary Disease (COPD) Cardiovascular Medical History: Positive for: Congestive Heart Failure - EF 33% Endocrine Medical History: Positive for: Type II Diabetes Time with diabetes: 14 years Treated with: Insulin, Oral agents Blood sugar tested every day: No Blood sugar testing results: Bedtime: 176 Genitourinary Medical History: Positive for: End Stage Renal Disease Integumentary (Skin) Medical History: Positive for: History of pressure wounds Neurologic Medical History: Positive for: Neuropathy - feel and legs HBO Extended  History Items Ear/Nose/Mouth/Throat: Ear/Nose/Mouth/Throat: Chronic sinus Middle ear problems problems/congestion Immunizations Pneumococcal Vaccine: Received Pneumococcal Vaccination: No Implantable Devices No devices added Family and Social History Current every day smoker; Marital Status - Single; Alcohol Use: Never; Drug Use: Prior History; Caffeine Use: Daily Electronic Signature(s) Signed: 05/31/2023 12:18:12 PM By: Geralyn Corwin DO Signed: 06/07/2023 2:28:30 PM By: Elliot Gurney, BSN, RN, CWS, [name] RN, BSN Entered By: Geralyn Corwin on 05/31/2023 12:09:06 [name] ([id number]) [id number] Page 23 of 24 -------------------------------------------------------------------------------- Total Contact Cast Details Patient Name: Date of Service: KOMALPREET, BLOUIN 05/31/2023 11:00 [name]: [id number] Patient Account Number: [id number] Date of Birth/Sex: Treating RN: 1976-01-01 (47 y.o. [name] Primary Care Provider: [name] Other Clinician: [name] Referring Provider: Treating Provider/Extender: Chapman Moss in Treatment: 66 T Contact Cast Applied for Wound Assessment: otal Wound #12 Left,Medial,Plantar Foot Performed By: Physician Geralyn Corwin, MD Post Procedure Diagnosis Same as Pre-procedure Electronic Signature(s) Signed: 05/31/2023 12:18:12 PM By: Geralyn Corwin DO Signed: 05/31/2023 5:47:33 PM By: [name] Entered By: [name] on 05/31/2023 11:26:50 -------------------------------------------------------------------------------- SuperBill Details Patient Name: Date of Service: [name] 05/31/2023 Medical Record Number: [id number] Patient Account Number: [id number] Date of Birth/Sex: Treating RN: Nov 03, 1975 (47 y.o. Cathlean Cower, [name] Primary Care Provider: [name] Other Clinician: [name] Referring Provider: Treating Provider/Extender: Chapman Moss in Treatment: 63 Diagnosis Coding ICD-10 Codes Code Description [phone] Non-pressure chronic ulcer of other part of left foot with other specified severity 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.621 Type 2 diabetes mellitus with foot ulcer L97.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 of skin W54.0XXA Bitten by dog, initial encounter S81.801A Unspecified open wound, right lower leg, initial encounter E11.622 Type 2 diabetes mellitus with other  skin ulcer E11.42 Type 2 diabetes mellitus with diabetic polyneuropathy Z79.4 Long term (current) use of insulin Facility Procedures : BRENN, EVANGELISTA Code: [id number] RISTI [name] [phone] Description: 97597 - DEBRIDE WOUND 1ST 20 SQ CM OR < 02) [id number] ICD-10 Diagnosis Description 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.621 Type 2 diabetes mellitus with foot ulcer Modifier: [id number] Quantity: 1 17.pdf Page 24 of 24 : CPT4 Code: [id number] Description: 29445 - APPLY TOTAL CONTACT LEG CAST ICD-10 Diagnosis Description 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it E11.621 Type 2 diabetes mellitus with foot ulcer Modifier: 1 y Quantity: Physician Procedures : CPT4 Code Description Modifier [id number] 99213 - WC PHYS LEVEL 3 - EST PT 25 ICD-10 Diagnosis Description L97.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 of skin W54.0XXA Bitten by dog, initial encounter E11.622  Type 2 diabetes mellitus with other skin ulcer Quantity: 1 : [id number] 97597 - WC PHYS DEBR WO ANESTH 20 SQ CM ICD-10 Diagnosis Description 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.621 Type 2 diabetes mellitus with foot ulcer Quantity: 1 : [id number] 29445 - WC PHYS APPLY TOTAL  CONTACT CAST ICD-10 Diagnosis Description 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ity E11.621 Type 2 diabetes mellitus with foot ulcer Quantity: 1 Electronic Signature(s) Signed: 05/31/2023 12:18:12 PM By: Geralyn Corwin DO Entered By: Geralyn Corwin on 05/31/2023 12:08:38

## 2023-06-07 NOTE — Progress Notes (Signed)
Heather Heather Heather ([id number]) [id number] Page 1 of 10 Visit Report for 05/31/2023 Arrival Information Details Patient Name: Date of Service: Heather Heather, Heather Heather 05/31/2023 11:00 Heather Heather Medical Record Number: [id number] Patient Account Number: [id number] Date of Birth/Sex: Treating RN: 1976/05/01 (47 y.o. Skip Mayer Primary Care : [name] Other Clinician: [name] Referring : Treating /Extender: Chapman Moss in Treatment: 43 Visit Information History Since Last Visit All ordered tests and consults were completed: No Patient Arrived: Ambulatory Added or deleted any medications: No Arrival Time: 10:56 Any new allergies or adverse reactions: No Transfer Assistance: None Had Heather fall or experienced change in No Patient Identification Verified: Yes activities of daily living that may affect Secondary Verification Process Completed: Yes risk of falls: Patient Requires Transmission-Based Precautions: No Signs or symptoms of abuse/neglect since last visito No Patient Has Alerts: No Hospitalized since last visit: No Implantable device outside of the clinic excluding No cellular tissue based products placed in the center since last visit: Has Dressing in Place as Prescribed: Yes Has Footwear/Offloading in Place as Prescribed: Yes Left: T Contact Cast otal Right: Wedge Shoe Pain Present Now: No Electronic Signature(s) Signed: 05/31/2023 5:47:33 PM By: [name] Entered By: [name] on 05/31/2023 10:57:32 -------------------------------------------------------------------------------- Clinic Level of Care Assessment Details Patient Name: Date of Service: Heather Heather, Heather Heather 05/31/2023 11:00 Heather Heather Medical Record Number: [id number] Patient Account Number: [id number] Date of Birth/Sex: Treating RN: 11-30-1975 (47 y.o. Skip Mayer Primary Care : [name] Other Clinician: [name] Referring : Treating /Extender: Chapman Moss in Treatment: 69 Clinic Level of Care Assessment Items TOOL 1 Quantity Score []  - 0 Use when EandM and Procedure is performed on INITIAL visit ASSESSMENTS - Nursing Assessment / Reassessment Heather Heather Heather ([id number]) [id number] Page 2 of 10 []  - 0 General Physical Exam (combine w/ comprehensive assessment (listed just below) when performed on new pt. evals) []  - 0 Comprehensive Assessment (HX, ROS, Risk Assessments, Wounds Hx, etc.) ASSESSMENTS - Wound and Skin Assessment / Reassessment []  - 0 Dermatologic / Skin Assessment (not related to wound area) ASSESSMENTS - Ostomy and/or Continence Assessment and Care []  - 0 Incontinence Assessment and Management []  - 0 Ostomy Care Assessment and Management (repouching, etc.) PROCESS - Coordination of Care []  - 0 Simple Patient / Family Education for ongoing care []  - 0 Complex (extensive) Patient / Family Education for ongoing care []  - 0 Staff obtains Chiropractor, Records, T Results / Process Orders est []  - 0 Staff telephones HHA, Nursing Homes / Clarify orders / etc []  - 0 Routine Transfer to another Facility (non-emergent condition) []  - 0 Routine Hospital Admission (non-emergent condition) []  - 0 New Admissions / Manufacturing engineer / Ordering NPWT Apligraf, etc. , []  - 0 Emergency Hospital Admission (emergent condition) PROCESS - Special Needs []  - 0 Pediatric / Minor Patient Management []  - 0 Isolation Patient Management []  - 0 Hearing / Language / Visual special needs []  - 0 Assessment of Community assistance (transportation, D/C planning, etc.) []  - 0 Additional assistance / Altered mentation []  - 0 Support Surface(s) Assessment (bed, cushion, seat, etc.) INTERVENTIONS - Miscellaneous []  - 0 External ear exam []  - 0 Patient Transfer (multiple staff / Nurse, adult / Similar devices) []  -  0 Simple Staple / Suture removal (25 or less) []  - 0 Complex Staple / Suture removal (26 or more) []  - 0 Hypo/Hyperglycemic Management (do not check if billed separately) []  -  0 Ankle / Brachial Index (ABI) - do not check if billed separately Has the patient been seen at the hospital within the last three years: Yes Total Score: 0 Level Of Care: ____ Electronic Signature(s) Signed: 05/31/2023 5:47:33 PM By: [name] Entered By: [name] on 05/31/2023 11:32:25 -------------------------------------------------------------------------------- Encounter Discharge Information Details Patient Name: Date of Service: Heather Jefferson Heather. 05/31/2023 11:00 Heather Heather Medical Record Number: [id number] Patient Account Number: [id number] Heather Heather Heather ([id number]) [id number] Page 3 of 10 Date of Birth/Sex: Treating RN: 31-May-1976 (47 y.o. Skip Mayer Primary Care : Other Clinician: Debby Freiberg Referring : Treating /Extender: Chapman Moss in Treatment: 60 Encounter Discharge Information Items Post Procedure Vitals Discharge Condition: Stable Temperature (F): 98.3 Ambulatory Status: Ambulatory Pulse (bpm): 71 Discharge Destination: Home Respiratory Rate (breaths/min): 16 Transportation: Private Auto Blood Pressure (mmHg): 176/89 Accompanied By: self Schedule Follow-up Appointment: Yes Clinical Summary of Care: Electronic Signature(s) Signed: 05/31/2023 5:47:33 PM By: [name] Entered By: [name] on 05/31/2023 12:10:43 -------------------------------------------------------------------------------- Lower Extremity Assessment Details Patient Name: Date of Service: Heather Heather, Heather Heather 05/31/2023 11:00 Heather Heather Medical Record Number: [id number] Patient Account Number: [id number] Date of Birth/Sex: Treating RN: [birth year] (47 y.o. Skip Mayer Primary Care : [name] Other  Clinician: [name] Referring : Treating /Extender: Chapman Moss in Treatment: 42 Electronic Signature(s) Signed: 05/31/2023 5:47:33 PM By: [name] Signed: 06/07/2023 2:28:30 PM By: Elliot Gurney BSN, RN, CWS, [name] RN, BSN Entered By: [name] on 05/31/2023 11:13:24 -------------------------------------------------------------------------------- Multi Wound Chart Details Patient Name: Date of Service: Heather Jefferson Heather. 05/31/2023 11:00 Heather Heather Medical Record Number: [id number] Patient Account Number: [id number] Date of Birth/Sex: Treating RN: May 28, 1976 (47 y.o. Skip Mayer Primary Care : [name] Other Clinician: [name] Referring : Treating /Extender: Chapman Moss in Treatment: 64 Vital Signs Height(in): 69 Pulse(bpm): 71 Weight(lbs): 178 Blood Pressure(mmHg): 176/89 Body Mass Index(BMI): 26.3 Temperature(F): 98.3 Heather Heather, Heather Heather ([id number]) [id number] Page 4 of 10 Respiratory Rate(breaths/min): 16 [11:Photos:] Right, Plantar Foot Left, Medial, Plantar Foot Right, Anterior Lower Leg Wound Location: Surgical Injury Pressure Injury Bite Wounding Event: Open Surgical Wound Diabetic Wound/Ulcer of the Lower Trauma, Other Primary Etiology: Extremity N/Heather N/Heather 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 Chronic sinus problems/congestion, Chronic sinus problems/congestion, Chronic sinus problems/congestion, Comorbid History: Middle ear problems, Anemia, Chronic Middle ear problems, Anemia, Chronic Middle ear problems, Anemia, Chronic Obstructive Pulmonary Disease Obstructive Pulmonary Disease Obstructive Pulmonary Disease (COPD), Congestive Heart Failure, (COPD), Congestive Heart Failure, (COPD), Congestive Heart Failure, Type II Diabetes, End Stage Renal Type II Diabetes, End Stage Renal Type II Diabetes, End Stage  Renal Disease, History of pressure wounds, Disease, History of pressure wounds, Disease, History of pressure wounds, Neuropathy Neuropathy Neuropathy 12/01/2021 03/16/2020 05/14/2023 Date Acquired: 74 63 2 Weeks of Treatment: Open Open Open Wound Status: No No No Wound Recurrence: 0.1x0.1x0.4 0.3x0.1x0.1 1.4x0.6x0.1 Measurements L x W x D (cm) 0.008 0.024 0.66 Heather (cm) : rea 0.003 0.002 0.066 Volume (cm) : 99.60% 99.50% 55.80% % Reduction in Area: 99.80% 99.80% 77.90% % Reduction in Volume: Full Thickness Without Exposed Grade 3 Full Thickness Without Exposed Classification: Support Structures Support Structures Small Medium Small Exudate Amount: Serous Serosanguineous Serosanguineous Exudate Type: amber red, brown red, brown Exudate Color: Distinct, outline attached Flat and Intact Distinct, outline attached Wound Margin: Small (1-33%) Large (67-100%) None Present (0%) Granulation Amount: Pink, Pale Pink, Pale N/Heather Granulation Quality: Small (1-33%) None Present (  0%) Large (67-100%) Necrotic Amount: Fat Layer (Subcutaneous Tissue): Yes Fat Layer (Subcutaneous Tissue): Yes Fat Layer (Subcutaneous Tissue): Yes Exposed Structures: Fascia: No Fascia: No Fascia: No Tendon: No Tendon: No Tendon: No Muscle: No Muscle: No Muscle: No Joint: No Joint: No Joint: No Bone: No Bone: No Bone: No Medium (34-66%) None None Epithelialization: Treatment Notes Electronic Signature(s) Signed: 05/31/2023 5:47:33 PM By: [name] Entered By: [name] on 05/31/2023 11:13:53 -------------------------------------------------------------------------------- Multi-Disciplinary Care Plan Details Patient Name: Date of Service: Heather Jefferson Heather. 05/31/2023 11:00 Heather Heather Medical Record Number: [id number] Patient Account Number: [id number] Date of Birth/Sex: Treating RN: [birth year] (47 y.o. Skip Mayer Primary Care : [name] Other Clinician: [name] Referring  : Treating /Extender: Cherine, Pierman Heather ([id number]) [id number] Page 5 of 10 Weeks in Treatment: 34 Active Inactive Abuse / Safety / Falls / Self Care Management Nursing Diagnoses: History of Falls Potential for falls Potential for injury related to falls Goals: Patient will not develop complications from immobility Date Initiated: 03/17/2022 Date Inactivated: 04/20/2022 Target Resolution Date: 03/16/2022 Goal Status: Met Patient/caregiver will verbalize understanding of skin care regimen Date Initiated: 03/17/2022 Date Inactivated: 09/28/2022 Target Resolution Date: 03/16/2022 Goal Status: Met Patient/caregiver will verbalize/demonstrate measure taken to improve self care Date Initiated: 03/17/2022 Target Resolution Date: 05/24/2023 Goal Status: Active Interventions: Assess fall risk on admission and as needed Provide education on basic hygiene Provide education on personal and home safety Notes: Electronic Signature(s) Signed: 05/31/2023 5:47:33 PM By: [name] Signed: 06/07/2023 2:28:30 PM By: Elliot Gurney, BSN, RN, CWS, [name] RN, BSN Entered By: [name] on 05/31/2023 12:09:30 -------------------------------------------------------------------------------- Pain Assessment Details Patient Name: Date of Service: Heather Jefferson Heather. 05/31/2023 11:00 Heather Heather Medical Record Number: [id number] Patient Account Number: [id number] Date of Birth/Sex: Treating RN: 24-Apr-1976 (47 y.o. Skip Mayer Primary Care : [name] Other Clinician: [name] Referring : Treating /Extender: Chapman Moss in Treatment: 46 Active Problems Location of Pain Severity and Description of Pain Patient Has Paino No Site Locations [place], [place] Heather ([id number]) [id number] Page 6 of 10 Pain Management and Medication Current Pain Management: Electronic  Signature(s) Signed: 05/31/2023 5:47:33 PM By: [name] Signed: 06/07/2023 2:28:30 PM By: Elliot Gurney, BSN, RN, CWS, [name] RN, BSN Entered By: [name] on 05/31/2023 11:09:31 -------------------------------------------------------------------------------- Patient/Caregiver Education Details Patient Name: Date of Service: Heather Heather 8/7/2024andnbsp11:00 Heather Heather Medical Record Number: [id number] Patient Account Number: [id number] Date of Birth/Gender: Treating RN: [birth year] (47 y.o. Skip Mayer Primary Care Physician: [name] Other Clinician: [name] Referring Physician: Treating Physician/Extender: Chapman Moss in Treatment: 32 Education Assessment Education Provided To: Patient Education Topics Provided Wound/Skin Impairment: Handouts: Other: continue wound care as directed Methods: Explain/Verbal Responses: State content correctly Electronic Signature(s) Signed: 05/31/2023 5:47:33 PM By: [name] Entered By: [name] on 05/31/2023 12:09:52 [place] Heather ([id number]) [id number] Page 7 of 10 -------------------------------------------------------------------------------- Wound Assessment Details Patient Name: Date of Service: Heather Heather, Heather Heather 05/31/2023 11:00 Heather Heather Medical Record Number: [id number] Patient Account Number: [id number] Date of Birth/Sex: Treating RN: Dec 02, 1975 (47 y.o. Skip Mayer Primary Care : [name] Other Clinician: [name] Referring : Treating /Extender: [name] Weeks in Treatment: 63 Wound Status Wound Number: 11 Primary Open Surgical Wound Etiology: Wound Location: Right, Plantar Foot Wound Open Wounding Event: Surgical Injury Status: Date Acquired: 12/01/2021 Comorbid Chronic sinus problems/congestion, Middle ear problems, Anemia, Weeks Of Treatment: 63 History: Chronic Obstructive Pulmonary  Disease  (COPD), Congestive Heart Clustered Wound: No Failure, Type II Diabetes, End Stage Renal Disease, History of pressure wounds, Neuropathy Photos Wound Measurements Length: (cm) 0.1 Width: (cm) 0.1 Depth: (cm) 0.4 Area: (cm) 0.008 Volume: (cm) 0.003 % Reduction in Area: 99.6% % Reduction in Volume: 99.8% Epithelialization: Medium (34-66%) Wound Description Classification: Full Thickness Without Exposed Suppo Wound Margin: Distinct, outline attached Exudate Amount: Small Exudate Type: Serous Exudate Color: amber rt Structures Foul Odor After Cleansing: No Slough/Fibrino Yes Wound Bed Granulation Amount: Small (1-33%) Exposed Structure Granulation Quality: Pink, Pale Fascia Exposed: No Necrotic Amount: Small (1-33%) Fat Layer (Subcutaneous Tissue) Exposed: Yes Necrotic Quality: Adherent Slough Tendon Exposed: No Muscle Exposed: No Joint Exposed: No Bone Exposed: No Electronic Signature(s) Signed: 05/31/2023 5:47:33 PM By: [name] Signed: 06/07/2023 2:28:30 PM By: Elliot Gurney, BSN, RN, CWS, [name] RN, BSN Entered By: [name] on 05/31/2023 11:11:48 [place] Heather ([id number]) [id number] Page 8 of 10 -------------------------------------------------------------------------------- Wound Assessment Details Patient Name: Date of Service: Heather Heather, Heather Heather 05/31/2023 11:00 Heather Heather Medical Record Number: [id number] Patient Account Number: [id number] Date of Birth/Sex: Treating RN: 1975/11/07 (47 y.o. Cathlean Cower, [name] Primary Care : [name] Other Clinician: [name] Referring : Treating /Extender: Chapman Moss in Treatment: 63 Wound Status Wound Number: 12 Primary Diabetic Wound/Ulcer of the Lower Extremity Etiology: Wound Location: Left, Medial, Plantar Foot Wound Open Wounding Event: Pressure Injury Status: Date Acquired: 03/16/2020 Comorbid Chronic sinus problems/congestion, Middle ear  problems, Anemia, Weeks Of Treatment: 63 History: Chronic Obstructive Pulmonary Disease (COPD), Congestive Heart Clustered Wound: No Failure, Type II Diabetes, End Stage Renal Disease, History of pressure wounds, Neuropathy Photos Wound Measurements Length: (cm) 0.3 Width: (cm) 0.1 Depth: (cm) 0.1 Area: (cm) 0.024 Volume: (cm) 0.002 % Reduction in Area: 99.5% % Reduction in Volume: 99.8% Epithelialization: None Wound Description Classification: Grade 3 Wound Margin: Flat and Intact Exudate Amount: Medium Exudate Type: Serosanguineous Exudate Color: red, brown Foul Odor After Cleansing: No Slough/Fibrino No Wound Bed Granulation Amount: Large (67-100%) Exposed Structure Granulation Quality: Pink, Pale Fascia Exposed: No Necrotic Amount: None Present (0%) Fat Layer (Subcutaneous Tissue) Exposed: Yes Tendon Exposed: No Muscle Exposed: No Joint Exposed: No Bone Exposed: No Electronic Signature(s) Signed: 05/31/2023 5:47:33 PM By: [name] Signed: 06/07/2023 2:28:30 PM By: Elliot Gurney, BSN, RN, CWS, [name] RN, BSN Entered By: [name] on 05/31/2023 11:12:38 [place] Heather ([id number]) [id number] Page 9 of 10 -------------------------------------------------------------------------------- Wound Assessment Details Patient Name: Date of Service: Heather Heather, Heather Heather 05/31/2023 11:00 Heather Heather Medical Record Number: [id number] Patient Account Number: [id number] Date of Birth/Sex: Treating RN: 1976-10-01 (47 y.o. Cathlean Cower, [name] Primary Care : [name] Other Clinician: [name] Referring : Treating /Extender: Chapman Moss in Treatment: 63 Wound Status Wound Number: 14 Primary Trauma, Other Etiology: Wound Location: Right, Anterior Lower Leg Secondary Diabetic Wound/Ulcer of the Lower Extremity Wounding Event: Bite Etiology: Date Acquired: 05/14/2023 Wound Open Weeks Of Treatment:  2 Status: Clustered Wound: No Comorbid Chronic sinus problems/congestion, Middle ear problems, Anemia, History: Chronic Obstructive Pulmonary Disease (COPD), Congestive Heart Failure, Type II Diabetes, End Stage Renal Disease, History of pressure wounds, Neuropathy Photos Wound Measurements Length: (cm) 1.4 Width: (cm) 0.6 Depth: (cm) 0.1 Area: (cm) 0.66 Volume: (cm) 0.066 % Reduction in Area: 55.8% % Reduction in Volume: 77.9% Epithelialization: None Wound Description Classification: Full Thickness Without Exposed Suppor Wound Margin: Distinct, outline attached Exudate Amount: Small Exudate Type: Serosanguineous Exudate Color: red, brown t Structures Foul Odor After  Cleansing: No Slough/Fibrino Yes Wound Bed Granulation Amount: None Present (0%) Exposed Structure Necrotic Amount: Large (67-100%) Fascia Exposed: No Necrotic Quality: Adherent Slough Fat Layer (Subcutaneous Tissue) Exposed: Yes Tendon Exposed: No Muscle Exposed: No Joint Exposed: No Bone Exposed: No Electronic Signature(s) Signed: 05/31/2023 5:47:33 PM By: [name] Signed: 06/07/2023 2:28:30 PM By: Elliot Gurney, BSN, RN, CWS, [name] RN, BSN [place], [place] Heather ([id number]) [phone].pdf Page 10 of 10 Signed: 06/07/2023 2:28:30 PM By: Elliot Gurney, BSN, RN, CWS, [name] RN, BSN Entered By: [name] on 05/31/2023 11:13:10 -------------------------------------------------------------------------------- Vitals Details Patient Name: Date of Service: Heather Jefferson Heather. 05/31/2023 11:00 Heather Heather Medical Record Number: [id number] Patient Account Number: [id number] Date of Birth/Sex: Treating RN: [birth year] (47 y.o. Cathlean Cower, [name] Primary Care : [name] Other Clinician: [name] Referring : Treating /Extender: Chapman Moss in Treatment: 56 Vital Signs Time Taken: 10:57 Temperature (F): 98.3 Height (in): 69 Pulse (bpm): 71 Weight (lbs):  178 Respiratory Rate (breaths/min): 16 Body Mass Index (BMI): 26.3 Blood Pressure (mmHg): 176/89 Reference Range: 80 - 120 mg / dl Electronic Signature(s) Signed: 05/31/2023 5:47:33 PM By: [name] Entered By: [name] on 05/31/2023 11:00:12

## 2023-06-08 NOTE — Progress Notes (Signed)
MAESA, PAINE [name] ([id number]) [id number] Page 1 of 20 Visit Report for 06/07/2023 Debridement Details Patient Name: Date of Service: [name] 06/07/2023 11:00 [name]: [id number] Patient Account Number: [id number] Date of Birth/Sex: Treating RN: 04/18/1976 (47 y.o. [name] Primary Care Provider: [name] Other Clinician: Referring Provider: Treating Provider/Extender: RO BSO N, MICHA EL Adela Lank, [name] Weeks in Treatment: 64 Debridement Performed for Assessment: Wound #12 Left,Medial,Plantar Foot Performed By: Physician Maxwell Caul, MD Debridement Type: Debridement Severity of Tissue Pre Debridement: Limited to breakdown of skin Level of Consciousness (Pre-procedure): Awake and Alert Pre-procedure Verification/Time Out Yes - 11:46 Taken: Pain Control: Lidocaine 4% T opical Solution Percent of Wound Bed Debrided: 100% T Area Debrided (cm): otal 0.01 Tissue and other material debrided: Viable, Non-Viable, Callus Level: Non-Viable Tissue Debridement Description: Selective/Open Wound Instrument: Curette Bleeding: None Response to Treatment: Procedure was tolerated well Level of Consciousness (Post- Awake and Alert procedure): Post Debridement Measurements of Total Wound Length: (cm) 0.1 Width: (cm) 0.1 Depth: (cm) 0.1 Volume: (cm) 0.001 Character of Wound/Ulcer Post Debridement: Stable Severity of Tissue Post Debridement: Limited to breakdown of skin Post Procedure Diagnosis Same as Pre-procedure Electronic Signature(s) Signed: 06/07/2023 4:20:15 PM By: Baltazar Najjar MD Signed: 06/07/2023 4:42:29 PM By: Angelina Pih Entered By: Baltazar Najjar on 06/07/2023 12:26:54 -------------------------------------------------------------------------------- HPI Details Patient Name: Date of Service: [name] 06/07/2023 11:00 [name] Christianne Dolin, Kandance [name] ([id number]) [id number]  Page 2 of 20 Medical Record Number: [id number] Patient Account Number: [id number] Date of Birth/Sex: Treating RN: 25-Mar-1976 (47 y.o. [name] Primary Care Provider: [name] Other Clinician: Referring Provider: Treating Provider/Extender: RO BSO N, MICHA EL Adela Lank, [name] Weeks in Treatment: 83 History of Present Illness HPI Description: 01/18/18-She is here for initial evaluation of the left great toe ulcer. She is [name] poor historian in regards to timeframe in detail. She states approximately 4 weeks ago she lacerated her toe on something in the house. She followed up with her primary care who placed her on Bactrim and ultimately [name] second dose of Bactrim prior to coming to wound clinic. She states she has been treating the toe with peroxide, Betadine and [name] Band-Aid. She did not check her blood sugar this morning but checked it yesterday morning it was 327; she is unaware of [name] recent A1c and there are no current records. She saw Dr. she would've orthopedics last week for an old injury to the left ankle, she states he did not see her toe, nor did she bring it to his attention. She smokes approximately 1 pack cigarettes [name] day. Her social situation is concerning, she arrives this morning with her mother who appears extremely intoxicated/under the influence; her mother was asked to leave the room and be monitored by the patient's grandmother. The patient's aunt then accompanied the patient and the room throughout the rest of the appointment. We had [name] lengthy discussion regarding the deleterious effects of uncontrolled hyperglycemia and smoking as it relates to wound healing and overall health. She was strongly encouraged to decrease her smoking and get her diabetes under better control. She states she is currently on [name] diet and has cut down her [facility] consumption. The left toe is erythematous, macerated and slightly edematous with malodor present. The edema in her left foot is  below her baseline, there is no erythema streaking. We will treat her with Santyl, doxycycline; we have ordered and xray, culture and provided [name] Peg assist surgical  shoe and cultured the wound. 01/25/18-She is here in follow-up evaluation for [name] left great toe ulcer and presents with an abscess to her suprapubic area. She states her blood sugars remain elevated, feeling "sick" and if levels are below 250, but she is trying. She has made no attempt to decrease her smoking stating that we "can't take away her food in her cigarettes". She has been compliant with offloading using the PEG assist you. She is using Santyl daily. the culture obtained last week grew staph aureus and Enterococcus faecalis; continues on the doxycycline and Augmentin was added on Monday. The suprapubic area has erythema, no femoral variation, purple discoloration, minimal induration, was accessed with [name] cotton tip applicator with sanguinopurulent drainage, this was cultured, I suspect the current antibiotic treatment will cover and we will not add anything to her current treatment plan. She was advised to go to urgent care or ER with any change in redness, induration or fever. 02/01/18-She is here in follow-up evaluation for left great toe ulcers and [name] new abdominal abscess from last week. She was able to use packing until earlier this week, where she "forgot it was there". She states she was feeling ill with GI symptoms last week and was not taking her antibiotic. She states her glucose levels have been predominantly less than 200, with occasional levels between 200-250. She thinks this was contributing to her GI symptoms as they have resolved without intervention. There continues to be significant laceration to left toe, otherwise it clinically looks stable/improved. There is now less superficial opening to the lateral aspect of the great toe that was residual blister. We will transition to [facility] to all wounds, she will  continue her Augmentin. If there is no change or deterioration next week for reculture. 02/08/18-She is here in follow-up evaluation for left great toe ulcer and abdominal ulcer. There is an improvement in both wounds. She has been wrapping her left toe with coban, not by our direction, which has created an area of discoloration to the medial aspect; she has been advised to NOT use coban secondary to her neuropathy. She states her glucose levels have been high over this last week ranging from 200-350, she continues to smoke. She admits to being less compliant with her offloading shoe. We will continue with same treatment plan and she will follow-up next week. 02/15/18-She is here in follow-up evaluation for left great toe ulcer and abdominal ulcer. The abdominal ulcer is epithelialized. The left great toe ulcer is improved and all injury from last week using the Coban wrap is resolved, the lateral ulcer is healed. She admits to noncompliance with wearing offloading shoe and admits to glucose levels being greater than 300 most of the week. She continues to smoke and expresses no desire to quit. There is one area medially that probes deeper than it has historically, erythema to the toe and dorsal foot has consistently waxed and waned. There is no overt signs of cellulitis or infection but we will culture the wound for any occult infection given the new area of depth and erythema. We will hold off on sensitivities for initiation of antibiotic therapy. 02/22/18-She is here in follow up evaluation for left great toe ulcer. There is overall significant improvement in both wound appearance, erythema and edema with changes made last week. She was not initiated on antibiotic therapy. Culture obtained last week showed oxacillin sensitive staph aureus, sensitive to clindamycin. Clindamycin has been called into the pharmacy but she has been instructed  to hold off on initiation secondary to overall clinical improvement  and her history of antibiotic intolerance. She has been instructed to contact the clinic with any noted changes/deterioration and the wound, erythema, edema and/or pain. She will follow-up next week. She continues to smoke and her glucose levels remain elevated >250; she admits to compliance with offloading shoe 03/01/18 on evaluation today patient appears to be doing fairly well in regard to her left first toe ulcer. She has been tolerating the dressing changes with the [facility] Dressing without complication and overall this has definitely showed signs of improvement according to records as well is what the patient tells me today. I'm very pleased in that regard. She is having no pain today 03/08/18 She is here for follow up evaluation of [name] left great toe ulcer. She remains non-compliant with glucose control and smoking cessation; glucose levels consistently >200. She states that she got new shoe inserts/peg assist. She admits to compliance with offloading. Since my last evaluation there is significant improvement. We will switch to prisma at this time and she will follow up next week. She is noted to be tachycardic at this appointment, heart rate 120s; she has [name] history of heart rate 70-130 according to our records. She admits to extreme agitation r/t personal issues; she was advised to monitor her heartrate and contact her physician if it does not return to [name] more normal range (<100). She takes cardizem twice daily. 03/15/18-She is here in follow-up evaluation for left great toe ulcer. She remains noncompliant with glucose control and smoking cessation. She admits to compliance with wearing offloading shoe. The ulcer is improved/stable and we will continue with the same treatment plan and she will follow-up next week 03/22/18-She is here for evaluation for left great toe ulcer. There continues to be significant improvement despite recurrent hyperglycemia (over 500 yesterday) and she continues to  smoke. She has been compliant with offloading and we will continue with same treatment plan and she will follow-up next week. 03/29/18-She is here for evaluation for left great toe ulcer. Despite continuing to smoke and uncontrolled diabetes she continues to improve. She is compliant with offloading shoe. We will continue with the same treatment plan and she will follow-up next week 04/05/18- She is here in follow up evaluation for [name] left great toe ulcer; she presents with small pustule to left fifth toe (resembles ant bite). She admits to compliance with wearing offloading shoe; continues to smoke or have uncontrolled blood glucose control. There is more callus than usual with evidence of bleeding; she denies known trauma. 04/12/18-She is here for evaluation of left great toe ulcer. Despite noncompliance with glycemic control and smoking she continues to make improvement. She continues to wear offloading shoe. The pustule, that was identified last week, to the left fifth toe is resolved. She will follow-up in 2 weeks 05/03/18-she is seen in follow-up evaluation for [name] left great toe ulcer. She is compliant with offloading, otherwise noncompliant with glycemic control and smoking. She has plateaued and there is minimal improvement noted. We will transition to [facility], replaced the insert to her surgical shoe and she will follow-up in one week 05/10/18- She is here in follow up evaluation for [name] left great toe ulcer. It appears stable despite measurement change. We will continue with same treatment plan and follow up next week. 05/24/18-She is seen in follow-up evaluation for [name] left great toe ulcer. She remains compliant with offloading, has made significant improvement in her diet, decreasing  the amount of sugar/soda. She said her recent A1c was 10.9 which is lower than. She did see [name] diabetic nutritionist/educator yesterday. She continues to smoke. We will continue with the same treatment plan and  she'll follow-up next week. 05/31/18- She is seen in follow-up evaluation for left great toe ulcer. She continues to remain compliant with offloading, continues to make improvement in her diet, increasing her water and decreasing the amount of sugar/soda. She does continue to smoke with no desire to quit. We will apply Prisma to the depth and Hydrofera Blue over. We have not received insurance authorization for oasis. She will follow up next week. 06/07/18-She is seen in follow-up evaluation for left great toe ulcer. It has stalled according to today's measurements although base appears stable. She says she saw [name] diabetic educator yesterday; her average blood sugars are less than 300 which is an improvement for her. She continues to smoke and states "that's my next step" She continues with water over soda. We will order for xray, culture and reinstate ace wrap compression prior to placing apligraf for next week. She is voicing no complaints or concerns. Her dressing will change to iodoflex over the next week in preparation for apligraf. 06/14/18-She is seen in follow-up evaluation for left great toe ulcer. Plain film x-ray performed last week was negative for osteomyelitis. Wound culture obtained last week grew strep B and OSSA; she is initiated on keflex and cefdinir today; there is erythema to the toe which could be from ace wrap compression, she has [name] history of wrapping too tight and has has been encouraged to maintain ace wraps that we place today. We will hold off on application of apligraf today, will apply next week after antibiotic therapy has been initiated. She admits today that she has resumed taking [name] shower with her foot/toe submerged in water, she has been reminded to keep foot/toe out of the bath water. She will be seen in follow up next week 06/21/18-she is seen in follow-up evaluation for left great toe ulcer. She is tolerating antibiotic therapy with no GI disturbance. The wound is stable.  Apligraf was ABBOTT, SARTOR [name] ([id number]) [id number] Page 3 of 20 applied today. She has been decreasing her smoking, only had 4 cigarettes yesterday and 1 today. She continues being more compliant in diabetic diet. She will follow-up next week for evaluation of site, if stable will remove at 2 weeks. 06/28/18- She is here in follow up evalution. Apligraf was placed last week, she states the dressing fell off on Tuesday and she was dressing with hydrofera blue. She is healed and will be discharged from the clinic today. She has been instructed to continue with smoking cessation, continue monitoring glucose levels, offloading for an additional 4 weeks and continue with hydrofera blue for additional two weeks for any possible microscopic opening. Readmission: 08/07/18 on evaluation today patient presents for reevaluation concerning the ulcer of her right great toe. She was previously discharged on 06/28/18 healed. Nonetheless she states that this began to show signs of drainage she subsequently went to her primary care provider. Subsequently an x-ray was performed on 08/01/18 which was negative. The patient was also placed on antibiotics at that time. Fortunately they should have been effective for the infection. Nonetheless she's been experiencing some improvement but still has [name] lot of drainage coming from the wound itself. 08/14/18 on evaluation today patient's wound actually does show signs of improvement in regard to the erythema at this point. She has completed the  antibiotics. With that being said we did discuss the possibility of placing her in [name] total contact cast as of today although I think that I may want to give this just [name] little bit more time to ensure nothing recurrence as far as her infection is concerned. I do not want to put in the cast and risk infection at that time if things are not completely resolved. With that being said she is gonna require some  debridement today. 08/21/18 on evaluation today patient actually appears to be doing okay in regard to her toe ulcer. She's been tolerating the dressing changes without complication. With that being said it does appear that she is ready and in fact I think it's appropriate for [place] to go ahead and initiate the total contact cast today. Nonetheless she will require some sharp debridement to prepare the wound for application. Overall I feel like things have been progressing well but we do need to do something to get this to close more readily. 08/24/18 patient seen today for reevaluation after having had the total contact cast applied on Tuesday. She seems to have done very well the wound appears to be doing great and overall I'm pleased with the progress that she's made. There were no abnormal areas of rubbing from the cast on her lower extremity. 08/30/18 on evaluation today patient actually appears to be completely healed in regard to her plantar toe ulcer. She tells me at this point she's been having [name] lot of issues with the cast. She almost fell [name] couple of times the state shall the step of her dog [name] couple times as well. This is been [name] very frustrating process for her other nonetheless she has completely healed the wound which is excellent news. Overall there does not appear to be the evidence of infection at this time which is great news. 09/11/18 evaluation today patient presents for follow-up concerning her great toe ulcer on the left which has unfortunately reopened since I last saw her which was only [name] couple of weeks ago. Unfortunately she was not able to get in to get the shoe and potentially the AFO that's gonna be necessary due to her left foot drop. She continues with offloading shoe but this is not enough to prevent her from reopening it appears. When we last had her in the total contact cast she did well from [name] healing standpoint but unfortunately the wound reopened as soon as she came out  of the cast within just [name] couple of weeks. 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 This is also part of the reason why she falls on [name] regular basis. I just do not believe that is gonna be safe for [place] to reinitiate the total contact cast as last time we had this on she fell 3 times one week which is definitely not normal for her. 09/18/18 upon evaluation today the patient actually appears to be doing about the same in regard to her toe ulcer. She did not contact [facility] as I asked her to even though I had given her the prescription. In fact she actually states that she has no idea where the prescription is. She did apparently call [facility] and they told her that all she needed to do was bring the  prescription in order to be able to be seen and work on getting the AFO for her left foot. With all that being said she still does not have an appointment and I'm not sure were things stand that regard. I will give her [name] new prescription today in order to contact them to get this set up. 09/25/18 on evaluation today patient actually appears to be doing about the same in regard to her toes ulcer. She does have [name] small areas which seems to have [name] lot of callous buildup around the edge of the wound which is going to need sharp debridement today. She still is waiting to be scheduled for evaluation with [facility] for possibility of an AFO. She states there supposed to call her tomorrow to get this set up. Unfortunately it does appear that her foot specifically the toe area is showing signs of erythema. There does not appear to be any systemic infection which is in these good news. 10/02/18 on evaluation today patient actually appears to be doing about the same in regard to her toe ulcer. This really has not done too well  although it's not significantly larger it's also not significantly smaller. She has been tolerating the dressing changes without complication. She actually has her appointment with [facility] and [place] tomorrow to hopefully be measured for obtaining and AFO splint. I think this would be helpful preventing this from reoccurring. We had contemplated starting the cast this week although to be honest I am reluctant to do that as she's been having nausea, vomiting, and seizure activity over the past three days. She has [name] history of seizures and have been told is nothing that can be done for these. With that being said I do believe that along with the seizures have the nausea vomiting which upon further questioning doesn't seem to be the normal for her and makes me concerned for the possibility of infection or something else going on. I discussed this with the patient and her mother during the office visit today. I do not feel the wound is effective but maybe something else. The responses this was "this just happens to her at times and we don't know why". They did not seem to be interested in going to the hospital to have this checked out further. 10/09/18 on evaluation today patient presents for follow-up concerning her ongoing toe ulcer. She has been tolerating the dressing changes without complication. Fortunately there does not appear to be any evidence of infection which is great news however I do think that the patient would benefit from going ahead for with the total contact cast. She's actually in [name] wheelchair today she tells me that she will use her walker if we initiate the cast. I was very specific about the fact that if we were gonna do the cast I wanted to make sure that she was using the walker in order to prevent any falls. She tells me she does not have stairs that she has to traverse on [name] regular basis at her home. She has not had any seizures since last week again that something that  happens to her often she tells me she did talk to [employer] and they said that it may take up to three weeks to get the brace approved for her. Hopefully that will not take that long but nonetheless in the meantime I do think the cast could be of benefit. 10/12/18 on evaluation today patient appears to be doing rather well in regard to her  toe ulcer. It's just been [name] few days and already this is significantly improved both as far as overall appearance and size. Fortunately there's no sign of infection. She is here for her first obligatory cast change. 10/19/18 Seen today for follow up and management of left great toe ulcer. Wound continues to show improvement. Noted small open area with seroussang drainage with palpation. Denies any increased pain or recent fevers during visit. She will continue calcium alginate with offloading shoe. Denies any questions or concerns during visit. 10/26/18 on evaluation today patient appears to be doing about the same as when I last saw her in regard to her wound bed. Fortunately there does not appear to be any signs of infection. Unfortunately she continues to have [name] breakdown in regard to the toe region any time that she is not in the cast. It takes almost no time at all for this to happen. Nonetheless she still has not heard anything from the brace being made by [employer] as to when exactly this will be available to her. Fortunately there is no signs of infection at this time. 10/30/18 on evaluation today patient presents for application of the total contact cast as we just received him this morning. Fortunately we are gonna be able to apply this to her today which is great news. She continues to have no significant pain which is good news. Overall I do feel like things have been improving while she was the cast is when she doesn't have [name] cast that things get worse. She still has not really heard anything from [employer] regarding her brace. 11/02/18 upon evaluation today  patient's wound already appears to be doing significantly better which is good news. Fortunately there does not appear to be any signs of infection also good news. Overall I do think the total contact cast as before is helping to heal this area unfortunately it's just not gonna likely keep the area closed and healed without her getting her brace at least. Again the foot drop is [name] significant issue for her. 11/09/18 on evaluation today patient appears to be doing excellent in regard to her toe ulcer which in fact is completely healed. Fortunately we finally got the situation squared away with the paperwork which was needed to proceed with getting her brace approved by Medicaid. I have filled that out unfortunately that information has been sent to the orthopedic office that I worked at 2 1/2 years ago and not tired Current wound care measures. Fortunately she seems to be TALEEYAH, [name] [name] ([id number]) [id number] Page 4 of 20 doing very well at this time. 11/23/18 on evaluation today patient appears to be doing More Poorly Compared to Last Time I Saw Her. At [facility] She Had Completely Healed. Currently she is continuing to have issues with reopening. She states that she just found out that the brace was approved through Medicaid now she just has to go get measured in order to have this fitted for her and then made. Subsequently she does not have an appointment for this yet that is going to complicate things we obviously cannot put her back in the cast if we do not have everything measured because they're not gonna be able to measure her foot while she is in the cast. 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 Obviously this is unfortunate and does have me somewhat worried as well.  11/30/18 on evaluation today patient's toe ulcer actually appears to be doing fairly well. The good  news is she will be getting her brace in the shoes next week on Wednesday. Hopefully we will be able to get this to heal without having to go back in the cast however she may need the cast in order to get the wound completely heal and then go from there. Fortunately there's no signs of infection at this time. 12/07/18 on evaluation today patient fortunately did receive her brace and she states she could tell this definitely makes her walk better. With that being said she's been having issues with her toe where she noticed yesterday there was [name] lot of tissue that was loosing off this appears to be much larger than what it was previous. She also states that her leg has been read putting much across the top of her foot just about the ankle although this seems to be receiving somewhat. The total area is still red and appears to be someone infected as best I can tell. She is previously taken Bactrim and that may be [name] good option for her today as well. We are gonna see what I wound culture shows as well and I think that this is definitely appropriate. With that being said outside of the culture I still need to initiate something in the interim and that's what I'm gonna go ahead and select Bactrim is [name] good option for her. 12/14/18 on evaluation today patient appears to be doing better in regard to her left great toe ulcer as compared to last week's evaluation. There's still some erythema although this is significantly improved which is excellent news. Overall I do believe that she is making good progress is still gonna take some time before she is where I would like her to be from the standpoint of being able to place her back into the total contact cast. Hopefully we will be where we need to be by next week. 12/21/18 on evaluation today patient actually appears to be doing poorly in regard to her toe ulcer. She's been tolerating the dressing changes without complication. Fortunately there's no signs of  systemic infection although she does have [name] lot of drainage from the toe ulcer and this does seem to be causing some issues at this point. She does have erythema on the distal portion of her toe that appears to be likely cellulitis. 12/28/18 on evaluation today patient actually appears to be doing [name] little better in my pinion in regard to her toe ulcer. With that being said she still does have some evidence of infection at this time and for her culture she had both E. coli as well as enterococcus as organisms noted on evaluation. For that reason I think that though the Keflex likely has treated the E. coli rather well this has really done nothing for the enterococcus. We are going to have to initiate treatment for this specifically. 01/04/19 on evaluation today patient's toe actually appears to be doing better from the standpoint of infection. She currently would like to see about putting the cash back on I think that this is appropriate as long as she takes care of it and keeps it from getting wet. She is gonna have some drainage we can definitely pass this up with Drawtex and alginate to try to prevent as much drainage as possible from causing the problems. With that being said I do want to at least try her with the cast between now  and Tuesday. If there any issues we can't continue to use it then I will discontinue the use of the cast at that point. 01/08/19 on evaluation today patient actually appears to be doing very well as far as her foot ulcer specifically the great toe on the left is concerned. She did have an area of rubbing on the medial aspect of her left ankle which again is from the cast. Fortunately there's no signs of infection at this point in this appears to be [name] very slight skin breakdown. The patient tells me she felt it rubbing but didn't think it was that bad. Fortunately there is no signs of active infection at this time which is good news. No fevers, chills, nausea, or vomiting noted  at this time. 01/15/19 on evaluation today patient actually appears to be doing well in regard to her toe ulcer. Again as previous she seems to do well and she has the cast on which indicates to me that during the time she doesn't have [name] cast on she's putting way too much pressure on this region. Obviously I think that's gonna be an issue as with the current national emergency concerning the Covid-19 Virus it has been recommended that we discontinue the use of total contact casting by the chief medical officer of our company, Dr. Maurine Minister. The reasoning is that if [name] patient becomes sick and cannot come into have the cast removed they could not just leave this on for an additional two weeks. Obviously the hospitals also do not want to receive patient's who are sick into the emergency department to potentially contaminate the region and spread the Covid-19 Virus among other sick individuals within the hospital system. Therefore at this point we are suspending the use of total contact cast until the current emergency subsides. This was all discussed with the patient today as well. 01/22/19 on evaluation today patient's wound on her left great toe appears to be doing slightly worse than previously noted last week. She tells me that she has been on this quite [name] bit in fact she tells me she's been awake for 38 straight hours. This is due to the fact that she's having to care for grandparents because nobody else will. She has been taking care of them for five the last seven days since I've seen her they both have dementia his is from [name] stroke and her grandmother's was progressive. Nonetheless she states even her mom who knows her condition and situation has only help two of those days to take care of them she's been taking care of the rest. Fortunately there does not appear to be any signs of active infection in regard to her toe at this point although obviously it doesn't look as good as it did previous. I think  this is directly related to her not taking off the pressure and friction by way of taking things easy. Though I completely understand what's going on. 01/29/19 on evaluation today patient's tools are actually appears to be showing some signs of improvement today compared to last week's evaluation as far as not necessarily the overall size of the wound but the fact that she has some new skin growth in between the two ends of the wound opening. Overall I feel like she has done well she states that she had [name] family member give her what sounds to be [name] CAM walker boot which has been helpful as well. 02/05/19 on evaluation today patient's wound bed actually appears to be doing  significantly better in regard to her overall appearance of the size of the wound. With that being said she is still having an issue with offloading efficiently enough to get this to close. Apparently there is some signs of infection at this point as well unfortunately. Previously she's done well of Augmentin I really do not see anything that needs to be culture currently but there theme and cellulitis of the foot that I'm seeing I'm gonna go ahead and place her on an antibiotic today to try to help clear this up. 02/12/2019 on evaluation today patient actually appears to be doing poorly in regard to her overall wound status. She tells me she has been using her offloading shoe but actually comes in today wearing her tennis shoe with the AFO brace. Again as I previously discussed with her this is really not sufficient to allow the area to heal appropriately. Nonetheless she continues to be somewhat noncompliant and I do wonder based on what she has told my nurse in the past as to whether or not [name] good portion of this noncompliance may be recreational drug and alcohol related. She has had [name] history of heroin overdose and this was fairly recently in the past couple of months that have been seeing her. Nonetheless overall I feel like her wound  looks significantly worse today compared to what it was previous. She still has significant erythema despite the Augmentin I am not sure that this is an appropriate medication for her infection I am also concerned that the infection may have gone down into her bone. 02/19/19 on evaluation today patient actually appears to be doing about the same in regard to her toe ulcer. Unfortunately she continues to show signs of bone exposure and infection at this point. There does not appear to be any evidence of worsening of the infection but I'm also not really sure that it's getting significantly better. She is on the Augmentin which should be sufficient for the Staphylococcus aureus infection that she has at this point. With that being said she may need IV antibiotics to more appropriately treat this. We did have [name] discussion today about hyperbaric option therapy. 02/28/19 on evaluation today patient actually appears to be doing much worse in regard to the wound on her left great toe as compared to even my previous evaluation last week. Unfortunately this seems to be training in [name] pretty poor direction. Her toe was actually now starting to angle laterally and I can actually see the entire joint area of the proximal portion of the digit where is the distal portion of the digit again is no longer even in contact with the joint line. Unfortunately there's [name] lot more necrotic tissue around the edge and the toe appears to be showing signs of becoming gangrenous in my pinion. I'm very concerned about were things stand at this point. She did see infectious disease and they are planning to send in [name] prescription for Sivextro for her and apparently this has been approved. With that being said I don't think she should avoid taking this but at the same time I'm not sure that it's gonna be sufficient to save her toe at this point. She tells me that she still having to care for grandparents which I think is putting quite [name]  bit of strain on her foot and specifically the total area and has caused this to break down even to [name] greater degree than would've otherwise been expected. 03/05/19 on evaluation today patient actually  appears to be doing quite well in regard to her toe all things considering. She still has bone exposed but there ZIYONNA, NOACK [name] ([id number]) [id number] Page 5 of 20 appears to be much less your thing on overall the appearance of the wound and the toe itself is dramatically improved. She still does have some issues currently obviously with infection she did see vascular as well and there concerned that her blood flow to the toad. For that reason they are setting up for an angiogram next week. 03/14/19 on evaluation today patient appears to be doing very poor in regard to her toe and specifically in regard to the ulceration and the fact that she's starting to notice the toe was leaning even more towards the lateral aspect and the complete joint is visible on the proximal aspect of the joint. Nonetheless she's also noted [name] significant odor and the tip of the toe is turning more dark and necrotic appearing. Overall I think she is getting worse not better as far as this is concerned. For that reason I am recommending at this point that she likely needs to be seen for likely amputation. READMISSION 03/19/2021 This is [name] patient that we cared for in this clinic for [name] prolonged period of time in 2019 and 2020 with [name] left foot and left first toe wound. 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 Excell Seltzer. In December 2021 she had an ulcer on her right great toe as well as the fourth and fifth toes. 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 She also had an angiogram at that time and underwent angioplasty of the right anterior tibial  artery. In any case she claims that the wound on the right foot is closed I did not look at this today which was probably an oversight although I think that should be done next week. 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 According to Dr. Bernette Redbird last review that she was out of the area being cared for by another physician but recently came back to his attention. 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 [name] culture of this area showed E. coli apparently before she came back to see Dr. Excell Seltzer she was supposed to be receiving antibiotics but she did not really take them. Nor is she offloading this area at all. Finally her last hemoglobin A1c listed in epic was in March 2022 at 14.1 she says things are [name] lot better since then although I am not sure. 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 She had this wound at the time. During this admission she had E. coli sepsis [name] TEE was negative. 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 She is still [name] heavy cigarette smoker. She has [name] history of bipolar disorder seizure disorder ABI in our clinic was 1.05 6/1; left midfoot in the setting of [name] TMA done previously. Round circular wound with [name] "knuckle" of protruding tissue. The problem is that the knuckle was not attached to any of the surrounding granulation  and this probed proximally widely I removed [name] large portion of this tissue. This wound goes with considerable undermining laterally. I do not feel any bone there was no purulence but this is [name] deep wound. 6/8; in spite of the debridement I did last week. She  arrives with [name] wound looking exactly the same. [name] protruding "knuckle" of tissue nonadherent to most of the surrounding tissue. There is considerable depth around this from 6-12 o'clock at 2.7 cm and undermining of 1 cm. This does not look overtly infected and the x- ray I did last week was negative for any osseous abnormalities. We have been using silver collagen 6/15; deep tissue culture I did last week showed moderate staph aureus and moderate Pseudomonas. This will definitely require prolonged antibiotic therapy. The pathology on the protuberant area was negative for malignancy fungus etc. 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 We have been using silver collagen. I am going to be prescribing Levaquin for 2 weeks. Her CT scan of the foot is down for 7/5 6/22; CT scan of the foot on 7 5. She says she has hardware in the left leg from her previous fracture. She is on the Levaquin for the deep tissue culture I did that showed methicillin sensitive staph aureus and Pseudomonas. I gave her [name] 2-week supply and she will have another week. She arrives in clinic today with the same protuberant tissue however this is nonadherent to the tissue surrounding it. I am really at [name] loss to explain this unless there is underlying deep tissue infection 6/29; patient presents for 1 week follow-up. She has been using collagen to the wound bed. She reports taking her antibiotics as prescribed.She has no complaints or issues today. She denies signs of infection. 7/6; patient presents for one week followup. She has been using collagen to the wound bed. She states she is taking Levaquin however at times she is not able to keep it down. She denies signs of infection. 7/13; patient presents for 1 week follow-up. She has been using silver alginate to the wound bed. She still has nausea when taking her antibiotics. She denies signs of infection. 7/20; patient  presents for 1 week follow-up. She has been using silver alginate with gentamicin cream to the wound bed. She denies any issues and has no complaints today. She denies signs of infection. 7/27; patient presents for 1 week follow-up. She continues to use silver alginate with gentamicin cream to the wound bed. She reports starting her antibiotics. She has no issues or complaints. Overall she reports stability to the wound. 8/3; patient presents for 1 week follow-up. She has been using silver alginate with gentamicin cream to the wound bed. She reports completing all antibiotics. She has no issues or complaints today. She denies signs of infection. 8/17; patient presents for 2-week follow-up. He is to use silver alginate to the wound bed. She has no issues or complaints today. She denies signs of infection. She reports her pain has improved in her foot since last clinic visit 8/24; patient presents for 1 week follow-up. She continues to use silver alginate to the wound bed. She has no issues or complaints. She denies signs of infection. Pain is stable. 9/7; patient presents for follow-up. She missed her last week appointment due to feeling ill. She continues to use silver alginate. She has [name] new wound to the right lower extremity that is covered in eschar. She states It occurred over the  past week and has no idea how it started. She currently denies signs of infection. 9/14; patient presents for follow-up. T the left foot wound she has been using gentamicin cream and silver alginate. T the right lower extremity wound she has o o been keeping this covered and has not obtain Santyl. 9/21; patient presents for follow-up. She reports using gentamicin cream and silver alginate to the left foot and Santyl to the right lower extremity wound. She has no issues or complaints today. She denies signs of infection. 9/28; patient presents for follow-up. She reports [name] new wound to her right heel. She states this  occurred [name] few days ago and is progressively gotten worse. She has been trying to clean the area with [name] Q-tip and Santyl. She reports stability in the other 2 wounds. She has been using gentamicin cream and silver alginate to the left foot and Santyl to the right lower extremity wound. 10/12; patient presents for follow-up. She reports improvement to the wound beds. She is seeing vein and vascular to discuss the potential of [name] left BKA. She states they are going to do an arteriogram. She continues to use silver alginate with dressing changes to her wounds. 11/2; patient presents for follow-up. She states she has not been doing dressing changes to the wound beds. She states she is not able to offload the areas. She reports chronic pain to her left foot wound. 11/9; patient presents for follow-up. She came in with only socks on. She states she forgot to put on shoes. It is unclear if she is doing any dressing changes. She currently denies systemic signs of infection. 11/16; patient presents for follow-up. She came again only with socks on. She states she does not wear shoes ever. It is unclear if she does dressing changes. She currently denies systemic signs of infection. 11/23; patient presents for follow-up. She wore her shoes today. It still unclear exactly what dressing she is using for each wound but she did states she obtained Dakin's solution and has been using this to the left foot wound. She currently denies signs of infection. 11/30; patient presents for follow-up. She has no issues or complaints today. She currently denies signs of infection. 12/7; patient presents for follow-up. She has no issues or complaints today. She has been using Hydrofera Blue to the right heel wound and Dakin solution to the left foot wound. Her right anterior leg wound is healed. She currently denies signs of infection. MESCHELLE, SASS [name] ([id number]) [id number] Page 6 of 20 12/14;  patient presents for follow-up. She has been using Hydrofera Blue to the right heel and Dakin's to the left foot wounds. She has no issues or complaints today. She denies signs of infection. 12/21; patient presents for follow-up. She reports using Hydrofera Blue to the right heel and Dakin's to the left foot wound. She denies signs of infection. 12/28; patient presents for follow-up. She continues to use Dakin's to the left foot wound and Hydrofera Blue to the right heel wound. She denies signs of infection. 1/4; patient presents for follow-up. She has no issues or complaints today. She denies signs of infection. 1/11; patient presents for follow-up. It is unclear if she has been dressing these wounds over the past week. She currently denies signs of infection. 1/18; patient presents for follow-up. She states she has been using Dakin's wet-to-dry dressings to the left foot. She has been using Hydrofera Blue to the right foot foot wound. She states that the anterior right  leg wound has reopened and draining serous fluid. She denies signs of infection. 1/25; patient presents for follow-up. She has no issues or complaints today. 2/1; patient presents for follow-up. She has no issues or complaints today. She denies signs of infection. 2/8; patient presents for follow-up. She has lost her surgical shoes. She did not have [name] dressing to the right heel wound. She currently denies signs of infection. 2/15; patient presents for follow-up. She reports more pain to the right heel today. She denies purulent drainage Or fever/chills 2/22; patient presents for follow-up. She reports taking clindamycin over the past week. She states that she continues to have pain to her right heel. She reports purulent drainage. Readmission 03/16/2022 Ms. Tolulope Woolbert is [name] 47 year old female with [name] past medical history of type 2 diabetes, osteomyelitis to her feet, chronic systolic heart failure and bipolar disorder that presents  to the clinic for bilateral feet wounds and right lower extremity wound. She was last seen in our clinic on 12/15/2021. At that time she had purulent drainage coming out of her right plantar foot and I recommended she go to the ED. She states she went to [facility] and has been there for the past 3 months. I cannot see the records. She states she had OR debridement and was on several weeks of IV antibiotics while inpatient. Since discharge she has not been taking care of the wound beds. She had nothing on her feet other than socks today. She currently denies signs of infection. 5/31; patient presents for follow-up. She has been using Dakin's wet-to-dry dressings to the wound beds on her feet bilaterally and antibiotic ointment to the right anterior leg wound. She had [name] wound culture done at last clinic visit that showed moderate Pseudomonas aeruginosa sensitive to ciprofloxacin. She currently denies systemic signs of infection. 6/14; patient presents for follow-up. She received Keystone 5 days ago and has been using this on the wound beds. She states that last week she had to go to the hospital because she had increased warmth and erythema to the right foot. She was started on 2 oral antibiotics. She states she has been taking these. She currently denies systemic signs of infection. She has no issues or complaints today. 6/21; patient presents for follow-up. She states she has been using Keystone antibiotics to the wound beds. She has no issues or complaints today. She denies signs of infection. 6/28; patient presents for follow-up. She has been using Keystone antibiotics to the wound beds. She has no issues or complaints today. 7/12; patient presents for follow-up. Has been using Keystone antibiotics to the wound beds with calcium alginate. She has no issues or complaints today. She never followed up with her orthopedic surgeon who did the OR debridement to the right foot. We discussed the  total contact cast for the left foot and patient would like to do this next week. 7/19; patient presents for follow-up. She has been using Keystone antibiotics with calcium alginate to the wound beds. She has no issues or complaints today. Patient is in agreement to do the total contact cast of the left foot today. She knows to return later this week for the obligatory cast change. 05-13-2022 upon evaluation today patient's wound which she has the cast of the left leg actually appears to be doing significantly better. Fortunately I do not see any signs of active infection locally or systemically which is great news and overall I am extremely pleased with where we stand currently. 7/26;  patient presents for follow-up. She has [name] cast in place for the past week. She states it irritated her shin. Other than that she tolerated the cast well. She states she would like [name] break for 1 week from the cast. We have been using Keystone antibiotic and Aquacel to both wound beds. She denies signs of infection. 8/2; patient presents for follow-up. She has been using Keystone and Aquacel to the wound beds. She denies any issues and has no complaints. She is agreeable to have the cast placed today for the left leg. 06-03-2022 upon evaluation today patient appears to be doing well with regard to her wound she saw some signs of improvement which is great news. Fortunately I do not see any evidence of active infection locally or systemically at this time which is great news. No fevers, chills, nausea, vomiting, or diarrhea. 8/16; patient presents for follow-up. She has no issues or complaints today. We have been using Keystone and Aquacel to the wound beds. The left lower extremity is in [name] total contact cast. She is tolerated this well. 8/23; patient presents for follow-up. She has had the total contact cast on the left leg for the past week. Unfortunately this has rubbed and broken down the skin to the medial foot. She  currently denies signs of infection. She has been using Keystone antibiotic to the right plantar foot wound. 8/30; patient presents for follow-up. We have held off on the total contact cast for the left leg for the past week. Her wound on the left foot has improved and the previous surrounding breakdown of skin has epithelialized. She has been using Keystone antibiotic to both wound beds. She has no issues or complaints today. She denies signs of infection. 9/6; patient presents for follow-up. She has ordered her's Keystone antibiotic and this is arriving this week. She has been doing Dakin's wet-to-dry dressings to the wound beds. She denies signs of infection. She is agreeable to the total contact cast today. 9/13; patient presents for follow-up. She states that the cast caused her left leg shin to be sore. She would like to take [name] break from the cast this week. She has been using Keystone antibiotic to the right plantar foot wound. She denies signs of infection. 9/20; patient presents for follow-up. She has been using Keystone antibiotics to the wound beds with calcium alginate to the right foot wound and Hydrofera Blue to the left foot wound. She is agreeable to having the cast placed today. She has been approved for Apligraf and we will order this for next clinic visit. 9/27; patient presents for follow-up. We have been using Keystone antibiotic with Hydrofera Blue to the left foot wound under [name] total contact cast. T the right o foot wound she has been using Keystone antibiotic and calcium alginate. She declines [name] total contact cast today. Apligraf is available for placement and she would like to proceed with this. 07-28-2022 upon evaluation today patient appears to be doing well currently in regard to her wound. She is actually showing signs of significant improvement JOLYNE, OXENDINE [name] ([id number]) [id number].pdf Page 7 of 20 which is great news. Fortunately I do not see  any evidence of active infection locally nor systemically at this time. She has been seeing Dr. Mikey Bussing and to be honest has been doing very well with the cast. Subsequently she comes in today with [name] cast on and we did reapply that today as well. She did not really want to she try to talk  me out of that but I explained that if she wanted to heal this is really the right way to go. Patient voiced understanding. In regard to her right foot this is actually [name] lot better compared to the last time I saw her which is also great news. 10/11; patient presents for follow-up. Apligraf and the total contact cast was placed to the left leg at last clinic visit. She states that her right foot wound had burning pain to it with the placement of Apligraf to this area. She has been doing [place] over this area. She denies signs of infection including increased warmth, erythema or purulent drainage. 11/1; 3-week follow-up. The patient fortunately did not have [name] total contact cast or an Apligraf and on the left foot. She has been using Keystone ABD pads and kerlix and her own running shoes She arrives in clinic today with thick callus and [name] very poor surface on the left foot on the right nonviable skin subcutaneous tissue and [name] deep probing hole. 11/15; patient missed her last clinic appointment. She states she has not been dressing the wound beds for the past 2 weeks. She states that at she had [name] new roommate but is now going back to live with her mother. Apparently its been [name] distracting 2 weeks. Patient currently denies signs of infection. 11/22; patient presents for follow-up. She states she has been using Keystone antibiotic and Dakin's wet-to-dry dressings to the wound beds. She is agreeable for cast placement today. We had ordered Apligraf however this has not been received by our facility. 11/29; Patient had [name] total contact cast placed at last clinic visit and she tolerated this well. We were using silver  alginate under the cast. Patient's been using Keystone antibiotic with Aquacel to the right plantar foot wound. She has no issues or complaints today. Apligraf is available for placement today. Patient would like to proceed with this. 12/6; patient presents for follow-up. She had Apligraf placed in standard fashion last clinic visit under the total contact cast to the left lower extremity. She has been using Keystone antibiotic and Aquacel to the right plantar foot wound. She has no issues or complaints today. 12/13; patient presents for follow-up. She has finished 5 Apligraf placements. Was told she would not qualify for more. We have been doing [name] total contact cast to the left lower extremity. She has been using Keystone antibiotic and Aquacel to the right plantar foot wound. She has no issues or complaints today. 12/20; patient presents for follow-up. We have been using Hydrofera Blue with Keystone antibiotic under [name] total contact cast of the left lower extremity. She reports using Keystone antibiotic and silver alginate to the right heel wound. She has no issues or complaints today. 12/27; patient presents with [name] healthy wound on the left midfoot. We have Apligraf to apply that to that more also using [name] total contact cast. On the right we are using Keystone and silver alginate. She is offloading the right heel with [name] surgical shoealthough by her admission she is on her feet quite [name] bit 1/3; patient presents for follow-up. Apligraf was placed to the wound beds last clinic visit. She was placed in [name] total contact cast to the left lower extremity. She declines [name] total contact cast today. She states that her mother is in the hospital and she cannot adequately get around with the cast on. 1/10; patient presents for follow-up. She declined the total contact cast at last clinic visit. Both wounds have  declined in appearance. She states that she has been on her feet and not offloading the wound beds. She  currently denies signs of infection. 1/17; patient presents for follow-up. She had the total contact cast along with Apligraf placed last week to the left lower extremity. She tolerated this well. She has been using Aquacel Ag and Keystone antibiotic to the right heel wound. She currently denies signs of infection. She has no issues or complaints today. 1/24; patient presents for follow-up. We have been using Apligraf to the left foot wound along with [name] total contact cast. She has done well with this. T the right o heel wound she has been using Aquacel Ag and Keystone antibiotic ointment. She has no issues or complaints today. She denies signs of infection. 1/31; patient presents for follow-up. We have been using Apligraf to the left foot wound along with the total contact cast. She continues to do well with this. To the right heel we have been using Aquacel Ag and Keystone antibiotic ointment. She has no issues or complaints today. 12-01-2022 upon evaluation patient is seen today on my schedule due to the fact that she unfortunately was in the hospital yesterday. Her cast needed to come off the only reason she is out of the hospital is due to the fact that they would not take it off at the hospital which is somewhat bewildered reading to me to be perfectly honest. I am not certain why this was but either way she was released and then was placed on my schedule today in order to get this off and reapply the total contact casting as appropriate. I do not have an Apligraf for her today it was applied last week and today's actually expired yesterday as there was some scheduling conflicts with her being in the hospital. Nonetheless we do not have that for reapplication today but the good news that she is not draining too much and the Apligraf can go for up to 2 weeks so I am going to go ahead and reapply the total contact casting but we are going to leave the Apligraf in place. 2/14; patient presents for  follow-up. T the left leg she has had the total contact cast and Apligraf for the past week. She has had no issues with this. T the o o right heel she has been using antibiotic ointment and Aquacel Ag. 2/21; patient presents for follow-up. We have been using Apligraf and [name] total contact cast to the left lower extremity. She is tolerated this well. Unfortunately she is not approved for any more Apligraf per insurance. She has been using antibiotic ointment and Aquacel Ag to the right foot. She has no issues or complaints today. 2/28; patient presents for follow-up. We have been using Hydrofera Blue and antibiotic ointment under the total contact cast to the left lower extremity. He has been using Aquacel Ag with antibiotic ointment to the right plantar foot. She has no issues or complaints today. 3/6; patient presents for follow-up. She did not obtain her gentamicin ointment. She has been using Aquacel Ag to the right plantar foot wound. We have been using Hydrofera Blue with antibiotic ointment under the total contact cast to the left lower extremity. She has no issues or complaints today. 3/12; patient presents for follow-up. She has been using gentamicin ointment and Aquacel Ag to the right plantar foot wound. We have been using Hydrofera Blue with antibiotic ointment under the total contact cast on the left lower extremity foot  wound. She has no issues or complaints today. 3/20; patient presents for follow-up. She has been using gentamicin ointment and Aquacel Ag to the right plantar foot wound. We have been using Hydrofera Blue with antibiotic ointment under the total contact cast to the left lower extremity wound. She has no issues or complaints today. 3/27; patient presents for follow-up. We have been using antibiotic ointment and Aquacel Ag to the right plantar foot wound. We have been using Hydrofera Blue with antibiotic ointment under the total contact cast to the left lower extremity. Both  wounds are smaller. 01-24-2023 upon evaluation today patient actually appears to be doing better compared to last time I saw her. It has been several weeks since I last saw her but nonetheless I think the total contact casting is doing [name] good job. Fortunately I do not see any evidence of infection or worsening overall which is great news and in general I do believe that she is moving in the appropriate direction. It has been [name] very long road but nonetheless I feel like she is nearing the end at least in regard to the left foot and even the right foot looks much better to be perfectly honest. 01-31-2023 upon evaluation today patient appears to be doing well currently in regard to her wounds. She has been require some sharp debridement which I think is probably bone both sides to remove [name] lot of callus that has built up at this point. I discussed that with her today. Also think total contact cast is doing well for the left foot Emina continue that as well. 4/17; patient presents for follow-up. We have been doing Hydrofera Blue with antibiotic ointment under the total contact cast to the left lower extremity and Aquacel Ag with antibiotic ointment to the right foot wound. She has been using her offloading heel shoe here. TORIN, LACKLAND [name] ([id number]) [id number] Page 8 of 20 4/24; patient presents for follow-up. We have been doing Hydrofera Blue with antibiotic ointment under the total contact cast to the left lower extremity and Aquacel Ag with antibiotic ointment to the right foot wound. She has been using her offloading heel shoe here. Wounds are smaller with slough accumulation. 5/1; patient presents for follow-up. We have been doing Hydrofera Blue with antibiotic ointment under the total contact cast to the left lower extremity and Aquacel Ag with antibiotic ointment to the right foot wound. Left foot wound is stable and right foot wound appears smaller. 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 On the left she is using Hydrofera Blue topical antibiotic under [name] total contact cast, on the right plantar heel gentamicin Aquacel Ag. According to our intake nurse both are making slow but steady improvements. 5/15; patient presents for follow-up. We have been doing Hydrofera Blue with antibiotic ointment under the total contact cast on the left and antibiotic ointment with Aquacel Ag on the right. Unfortunately the left foot wound has declined in size and appearance. We also do not have the total contact cast available in office today. Patient denies signs of infection. 5/22; patient presents for follow-up. We have been doing Hydrofera Blue and antibiotic ointment to the left foot and Aquacel Ag with antibiotic ointment to the right foot. We took [name] break from the cast since we did not have it placed at last week. Wounds look stable if not slightly improved. 03-27-2023 patient presents today she has had this For Most 2 Weeks Because She Was in the Hospital and They Did Not  T It off. Again That Is Really Not ake Something That She Could Control but Nonetheless We Are Seeing Her at This Point Today for Reevaluation. We Do Want to Go and See about Reapply the Cast. Again We Had All Set Everything up to Go Ahead and Do This When We Went to Get the Cast Only to Realize That Not Had Come in and We Were out at the Moment. With That Being [place] Spaniel We Will Get Have T Get the Exam before We Can Reapply the T Contact Casting Going Forward. o otal 6/12; patient presents for follow-up. She has been using Hydrofera Blue to the left foot wound and Aquacel Ag to the right foot wound. No cast was available last week placed on the patient so she has been offloading with her surgical shoe. She has no issues or complaints today. 6/19; patient presents for follow-up. We have been using Hydrofera Blue to the left foot wound under the total contact cast and Aquacel Ag to the right wound. She has no  issues or complaints today. The wounds are smaller. 6/26; patient presents for follow-up. We have been using Hydrofera Blue to the left foot wound under the total contact cast and Aquacel Ag to the right foot wound. Wounds are smaller. Patient has no issues or complaints today. 7/3; patient presents for follow-up. We have been using Hydrofera Blue to the left foot wound under the total contact cast and Aquacel Ag to the right foot wound. Wounds Appear smaller. No signs of infection. 7/10; patient presents for follow-up. We have been using Hydrofera Blue to the left foot wound under the total contact cast and Aquacel Ag to the right foot wound. Left foot wound is smaller. Right foot wound is stable. 7/17; patient presents for follow-up. We have been using Hydrofera Blue to the left foot under the total contact cast and Aquacel Ag to the right foot wound. She has no issues or complaints today. 7/24; patient presents for follow-up. We have been using Hydrofera Blue to the left foot under the total contact cast and Aquacel Ag to the right foot wound. Unfortunately she was bit by her dog over the weekend and developed [name] wound to the right lower leg. She has been keeping the area covered. 7/31; patient presents for follow-up. We have been using Hydrofera Blue to the left foot under the total contact cast and Aquacel Ag to the right foot wound and mupirocin ointment to the right anterior leg wound. She started her oral antibiotics 2 days ago. 8/7; patient presents for follow-up. We have been using Hydrofera Blue and antibiotic ointment to the left foot under the total contact cast and Aquacel Ag to the right foot wound. Antibiotic ointment to the right anterior leg wound. She completed her oral antibiotics. She has no issues or complaints today. Wounds continue to improve albeit slowly. 8/14; 8/14; patient has 3 wounds 1 on the left plantar heel 1 on the right plantar heel and [name] small wound on the right  anterior lower leg. We have been using Hydrofera Blue in the left heel Aquacel on the right heel. The left heel has been in [name] total contact cast Electronic Signature(s) Signed: 06/07/2023 4:20:15 PM By: Baltazar Najjar MD Entered By: Baltazar Najjar on 06/07/2023 12:20:24 -------------------------------------------------------------------------------- Physical Exam Details Patient Name: Date of Service: [name] 06/07/2023 11:00 [name]: [id number] Patient Account Number: [id number] Date of Birth/Sex: Treating RN: 21-Nov-1975 (47 y.o. [name] Primary Care  Provider: [name] Other Clinician: Referring Provider: Treating Provider/Extender: RO BSO N, MICHA EL Adela Lank, [name] Weeks in Treatment: 64 Constitutional Sitting or standing Blood Pressure is within target range for patient.. Pulse regular and within target range for patient.Marland Kitchen Respirations regular, non-labored and within target range.. Temperature is normal and within the target range for the patient.Marland Kitchen Appears chronically unwell. Gastrointestinal (GI) CARICE, CONEY [name] ([id number]) [id number] Page 9 of 20 Abdomen is soft and non-distended without masses or tenderness. Bowel sounds active in all quadrants.. Notes Wound exam Right foot this is just about healed somewhat irregular epithelialization but very close to being totally close Similarly on the left foot the wound is just about close there is [name] lot of callus here that I removed with [name] #5 curette but the wound is very superficial On the right anterior lower leg superficial wound granulation appears to be healthy. Electronic Signature(s) Signed: 06/07/2023 4:20:15 PM By: Baltazar Najjar MD Entered By: Baltazar Najjar on 06/07/2023 12:24:39 -------------------------------------------------------------------------------- Physician Orders Details Patient Name: Date of Service: [name] 06/07/2023 11:00 [name]  M Medical Record Number: [id number] Patient Account Number: [id number] Date of Birth/Sex: Treating RN: 1976/02/17 (47 y.o. 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 Bathing/ Shower/ Hygiene May shower with wound dressing protected with water repellent cover or cast protector. No tub bath. Anesthetic (Use 'Patient Medications' Section for Anesthetic Order Entry) Lidocaine applied to wound bed Off-Loading Total Contact Cast to Left Lower Extremity Open toe surgical shoe - right Wound Treatment Wound #11 - Foot Wound Laterality: Plantar, Right Cleanser: Soap and Water 3 x Per Week/30 Days Discharge Instructions: Gently cleanse wound with antibacterial soap, rinse and pat dry prior to dressing wounds Cleanser: Wound Cleanser 3 x Per Week/30 Days Discharge Instructions: Wash your hands with soap and water. Remove old dressing, discard into plastic bag and place into trash. Cleanse the wound with Wound Cleanser prior to applying [name] clean dressing using gauze sponges, not tissues or cotton balls. Do not scrub or use excessive force. Pat dry using  gauze sponges, not tissue or cotton balls. BRELYNN, BEEKMAN [name] ([id number]) [id number] Page 10 of 20 Prim Dressing: Aquacel Extra Hydrofiber Dressing, 2x2 (in/in) ary 3 x Per Week/30 Days Secondary Dressing: ABD Pad 5x9 (in/in) 3 x Per Week/30 Days Discharge Instructions: Cover with ABD pad Secondary Dressing: 3 x Per Week/30 Days Secured With: Medipore T - 71M Medipore H Soft Cloth Surgical T ape ape, 2x2 (in/yd) 3 x Per Week/30 Days Secured With: [employer] or Non-Sterile 6-ply 4.5x4 (yd/yd) 3 x Per Week/30 Days Discharge Instructions: Apply Kerlix as directed Wound #12 - Foot Wound Laterality: Plantar, Left, Medial Cleanser: Soap and Water 1 x Per Week/30 Days Discharge Instructions: Gently cleanse wound with antibacterial  soap, rinse and pat dry prior to dressing wounds Cleanser: Wound Cleanser 1 x Per Week/30 Days Discharge Instructions: Wash your hands with soap and water. Remove old dressing, discard into plastic bag and place into trash. Cleanse the wound with Wound Cleanser prior to applying [name] clean dressing using gauze sponges, not tissues or cotton balls. Do not scrub or use excessive force. Pat dry using gauze sponges, not tissue or cotton balls. Topical: Gentamicin 1 x Per Week/30 Days Discharge Instructions: Apply as directed by provider. Topical: Mupirocin Ointment 1 x Per Week/30 Days Discharge Instructions: Apply as directed by provider. Prim Dressing: Hydrofera Blue Ready Transfer Foam, 2.5x2.5 (in/in) 1 x Per Week/30 Days ary Discharge Instructions: Apply Hydrofera Blue Ready to wound bed as directed Secondary Dressing: Gauze 1 x Per Week/30 Days Discharge Instructions: As directed: dry Secondary Dressing: heel cup 1 x Per Week/30 Days Secured With: Medipore T - 32M Medipore H Soft Cloth Surgical T ape ape, 2x2 (in/yd) 1 x Per Week/30 Days Secured With: [employer] or Non-Sterile 6-ply 4.5x4 (yd/yd) 1 x Per Week/30  Days Discharge Instructions: Apply Kerlix as directed Wound #14 - Lower Leg Wound Laterality: Right, Anterior Cleanser: Normal Saline 1 x Per Day Discharge Instructions: Wash your hands with soap and water. Remove old dressing, discard into plastic bag and place into trash. Cleanse the wound with Normal Saline prior to applying [name] clean dressing using gauze sponges, not tissues or cotton balls. Do not scrub or use excessive force. Pat dry using gauze sponges, not tissue or cotton balls. Topical: Mupirocin Ointment 1 x Per Day Discharge Instructions: Apply as directed by provider. Secondary Dressing: (BORDER) Zetuvit Plus SILICONE BORDER Dressing 4x4 (in/in) 1 x Per Day Discharge Instructions: Please do not put silicone bordered dressings under wraps. Use non-bordered dressing only. Electronic Signature(s) Signed: 06/07/2023 1:50:28 PM By: Angelina Pih Signed: 06/07/2023 4:20:15 PM By: Baltazar Najjar MD Entered By: Angelina Pih on 06/07/2023 13:50:26 -------------------------------------------------------------------------------- Problem List Details Patient Name: Date of Service: [name] 06/07/2023 11:00 [name]: [id number] Patient Account Number: [id number] DANAI, KOSKOVICH [name] ([id number]) [id number] Page 11 of 20 Date of Birth/Sex: Treating RN: May 29, 1976 (47 y.o. 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 03/16/2022 No Yes E11.621 Type 2 diabetes mellitus with foot ulcer 03/16/2022 No Yes L97.811 Non-pressure chronic ulcer  of other part of right lower leg limited to breakdown 03/16/2022 No Yes of skin W54.0XXA Bitten by dog, initial encounter 05/17/2023 No Yes S81.801A Unspecified open wound, right lower leg, initial encounter 05/17/2023 No Yes E11.622 Type 2 diabetes mellitus with other skin ulcer 05/17/2023 No Yes E11.42 Type 2 diabetes mellitus with diabetic polyneuropathy 03/16/2022 No Yes Z79.4 Long term (current) use of insulin 02/22/2023 No Yes Inactive Problems Resolved Problems Electronic Signature(s) Signed: 06/07/2023 4:20:15 PM By: Baltazar Najjar MD Entered By: Baltazar Najjar on 06/07/2023 12:18:31 -------------------------------------------------------------------------------- Progress Note Details Patient Name: Date of Service: [name] 06/07/2023 11:00 [name]: [id number] Patient Account Number: [id number] NYEL, HURSH [name] ([id number]) [id number] Page 12 of 20 Date of Birth/Sex: Treating RN: 28-Mar-1976 (47 y.o. [name] Primary Care Provider:  Other Clinician: [name] Referring Provider: Treating Provider/Extender: RO BSO N, MICHA EL Adela Lank, [name] Weeks in Treatment: 64 Subjective History of Present Illness (HPI) 01/18/18-She is here for initial evaluation of the left great toe ulcer. She is [name] poor historian in regards to timeframe in detail. She states approximately 4 weeks ago she lacerated her toe on something in the house. She followed up with her primary care who placed her on Bactrim and ultimately [name] second dose of Bactrim prior to coming to wound clinic. She states she has been treating the toe with peroxide, Betadine and [name] Band-Aid. She did not check her blood sugar this morning but checked it yesterday morning it was 327; she is unaware of [name] recent A1c and there are no current records. She saw Dr. she would've orthopedics last week for an old injury to the left ankle, she states he did not see her toe, nor did she  bring it to his attention. She smokes approximately 1 pack cigarettes [name] day. Her social situation is concerning, she arrives this morning with her mother who appears extremely intoxicated/under the influence; her mother was asked to leave the room and be monitored by the patient's grandmother. The patient's aunt then accompanied the patient and the room throughout the rest of the appointment. We had [name] lengthy discussion regarding the deleterious effects of uncontrolled hyperglycemia and smoking as it relates to wound healing and overall health. She was strongly encouraged to decrease her smoking and get her diabetes under better control. She states she is currently on [name] diet and has cut down her [facility] consumption. The left toe is erythematous, macerated and slightly edematous with malodor present. The edema in her left foot is below her baseline, there is no erythema streaking. We will treat her with Santyl, doxycycline; we have ordered and xray, culture and provided [name] Peg assist surgical shoe and cultured the wound. 01/25/18-She is here in follow-up evaluation for [name] left great toe ulcer and presents with an abscess to her suprapubic area. She states her blood sugars remain elevated, feeling "sick" and if levels are below 250, but she is trying. She has made no attempt to decrease her smoking stating that we "can't take away her food in her cigarettes". She has been compliant with offloading using the PEG assist you. She is using Santyl daily. the culture obtained last week grew staph aureus and Enterococcus faecalis; continues on the doxycycline and Augmentin was added on Monday. The suprapubic area has erythema, no femoral variation, purple discoloration, minimal induration, was accessed with [name] cotton tip applicator with sanguinopurulent drainage, this was cultured, I suspect the current antibiotic treatment will cover and we will not add anything to her current treatment plan. She was advised to  go to urgent care or ER with any change in redness, induration or fever. 02/01/18-She is here in follow-up evaluation for left great toe ulcers and [name] new abdominal abscess from last week. She was able to use packing until earlier this week, where she "forgot it was there". She states she was feeling ill with GI symptoms last week and was not taking her antibiotic. She states her glucose levels have been predominantly less than 200, with occasional levels between 200-250. She thinks this was contributing to her GI symptoms as they have resolved without intervention. There continues to be significant laceration to left toe, otherwise it clinically looks stable/improved. There is now less superficial opening to the lateral aspect of the great toe that  was residual blister. We will transition to [facility] to all wounds, she will continue her Augmentin. If there is no change or deterioration next week for reculture. 02/08/18-She is here in follow-up evaluation for left great toe ulcer and abdominal ulcer. There is an improvement in both wounds. She has been wrapping her left toe with coban, not by our direction, which has created an area of discoloration to the medial aspect; she has been advised to NOT use coban secondary to her neuropathy. She states her glucose levels have been high over this last week ranging from 200-350, she continues to smoke. She admits to being less compliant with her offloading shoe. We will continue with same treatment plan and she will follow-up next week. 02/15/18-She is here in follow-up evaluation for left great toe ulcer and abdominal ulcer. The abdominal ulcer is epithelialized. The left great toe ulcer is improved and all injury from last week using the Coban wrap is resolved, the lateral ulcer is healed. She admits to noncompliance with wearing offloading shoe and admits to glucose levels being greater than 300 most of the week. She continues to smoke and expresses no  desire to quit. There is one area medially that probes deeper than it has historically, erythema to the toe and dorsal foot has consistently waxed and waned. There is no overt signs of cellulitis or infection but we will culture the wound for any occult infection given the new area of depth and erythema. We will hold off on sensitivities for initiation of antibiotic therapy. 02/22/18-She is here in follow up evaluation for left great toe ulcer. There is overall significant improvement in both wound appearance, erythema and edema with changes made last week. She was not initiated on antibiotic therapy. Culture obtained last week showed oxacillin sensitive staph aureus, sensitive to clindamycin. Clindamycin has been called into the pharmacy but she has been instructed to hold off on initiation secondary to overall clinical improvement and her history of antibiotic intolerance. She has been instructed to contact the clinic with any noted changes/deterioration and the wound, erythema, edema and/or pain. She will follow-up next week. She continues to smoke and her glucose levels remain elevated >250; she admits to compliance with offloading shoe 03/01/18 on evaluation today patient appears to be doing fairly well in regard to her left first toe ulcer. She has been tolerating the dressing changes with the [facility] Dressing without complication and overall this has definitely showed signs of improvement according to records as well is what the patient tells me today. I'm very pleased in that regard. She is having no pain today 03/08/18 She is here for follow up evaluation of [name] left great toe ulcer. She remains non-compliant with glucose control and smoking cessation; glucose levels consistently >200. She states that she got new shoe inserts/peg assist. She admits to compliance with offloading. Since my last evaluation there is significant improvement. We will switch to prisma at this time and she will follow  up next week. She is noted to be tachycardic at this appointment, heart rate 120s; she has [name] history of heart rate 70-130 according to our records. She admits to extreme agitation r/t personal issues; she was advised to monitor her heartrate and contact her physician if it does not return to [name] more normal range (<100). She takes cardizem twice daily. 03/15/18-She is here in follow-up evaluation for left great toe ulcer. She remains noncompliant with glucose control and smoking cessation. She admits to compliance with wearing offloading  shoe. The ulcer is improved/stable and we will continue with the same treatment plan and she will follow-up next week 03/22/18-She is here for evaluation for left great toe ulcer. There continues to be significant improvement despite recurrent hyperglycemia (over 500 yesterday) and she continues to smoke. She has been compliant with offloading and we will continue with same treatment plan and she will follow-up next week. 03/29/18-She is here for evaluation for left great toe ulcer. Despite continuing to smoke and uncontrolled diabetes she continues to improve. She is compliant with offloading shoe. We will continue with the same treatment plan and she will follow-up next week 04/05/18- She is here in follow up evaluation for [name] left great toe ulcer; she presents with small pustule to left fifth toe (resembles ant bite). She admits to compliance with wearing offloading shoe; continues to smoke or have uncontrolled blood glucose control. There is more callus than usual with evidence of bleeding; she denies known trauma. 04/12/18-She is here for evaluation of left great toe ulcer. Despite noncompliance with glycemic control and smoking she continues to make improvement. She continues to wear offloading shoe. The pustule, that was identified last week, to the left fifth toe is resolved. She will follow-up in 2 weeks 05/03/18-she is seen in follow-up evaluation for [name] left great toe  ulcer. She is compliant with offloading, otherwise noncompliant with glycemic control and smoking. She has plateaued and there is minimal improvement noted. We will transition to [facility], replaced the insert to her surgical shoe and she will follow-up in one week 05/10/18- She is here in follow up evaluation for [name] left great toe ulcer. It appears stable despite measurement change. We will continue with same treatment plan and follow up next week. 05/24/18-She is seen in follow-up evaluation for [name] left great toe ulcer. She remains compliant with offloading, has made significant improvement in her diet, decreasing the amount of sugar/soda. She said her recent A1c was 10.9 which is lower than. She did see [name] diabetic nutritionist/educator yesterday. She continues to smoke. We will continue with the same treatment plan and she'll follow-up next week. 05/31/18- She is seen in follow-up evaluation for left great toe ulcer. She continues to remain compliant with offloading, continues to make improvement in her diet, increasing her water and decreasing the amount of sugar/soda. She does continue to smoke with no desire to quit. We will apply Prisma to the depth and Hydrofera Blue over. We have not received insurance authorization for oasis. She will follow up next week. 06/07/18-She is seen in follow-up evaluation for left great toe ulcer. It has stalled according to today's measurements although base appears stable. She says she saw [name] diabetic educator yesterday; her average blood sugars are less than 300 which is an improvement for her. She continues to smoke and states "that's my next step" She continues with water over soda. We will order for xray, culture and reinstate ace wrap compression prior to placing apligraf for next week. She is voicing no complaints or concerns. Her dressing will change to iodoflex over the next week in preparation for apligraf. 06/14/18-She is seen in follow-up evaluation for  left great toe ulcer. Plain film x-ray performed last week was negative for osteomyelitis. Wound culture obtained last week grew strep B and OSSA; she is initiated on keflex and cefdinir today; there is erythema to the toe which could be from ace wrap compression, she has [name] history of wrapping too tight and has has been encouraged to maintain  ace wraps that we place today. We will hold off on application of apligraf today, will apply next week after antibiotic therapy has been initiated. She admits today that she has resumed taking [name] shower with her foot/toe submerged in water, she has been reminded to keep foot/toe out of the bath water. She will be seen in follow up next week 06/21/18-she is seen in follow-up evaluation for left great toe ulcer. She is tolerating antibiotic therapy with no GI disturbance. The wound is stable. Apligraf was applied today. She has been decreasing her smoking, only had 4 cigarettes yesterday and 1 today. She continues being more compliant in diabetic diet. She DIA, LUC [name] ([id number]) [id number] Page 13 of 20 will follow-up next week for evaluation of site, if stable will remove at 2 weeks. 06/28/18- She is here in follow up evalution. Apligraf was placed last week, she states the dressing fell off on Tuesday and she was dressing with hydrofera blue. She is healed and will be discharged from the clinic today. She has been instructed to continue with smoking cessation, continue monitoring glucose levels, offloading for an additional 4 weeks and continue with hydrofera blue for additional two weeks for any possible microscopic opening. Readmission: 08/07/18 on evaluation today patient presents for reevaluation concerning the ulcer of her right great toe. She was previously discharged on 06/28/18 healed. Nonetheless she states that this began to show signs of drainage she subsequently went to her primary care provider. Subsequently an x-ray was  performed on 08/01/18 which was negative. The patient was also placed on antibiotics at that time. Fortunately they should have been effective for the infection. Nonetheless she's been experiencing some improvement but still has [name] lot of drainage coming from the wound itself. 08/14/18 on evaluation today patient's wound actually does show signs of improvement in regard to the erythema at this point. She has completed the antibiotics. With that being said we did discuss the possibility of placing her in [name] total contact cast as of today although I think that I may want to give this just [name] little bit more time to ensure nothing recurrence as far as her infection is concerned. I do not want to put in the cast and risk infection at that time if things are not completely resolved. With that being said she is gonna require some debridement today. 08/21/18 on evaluation today patient actually appears to be doing okay in regard to her toe ulcer. She's been tolerating the dressing changes without complication. With that being said it does appear that she is ready and in fact I think it's appropriate for [place] to go ahead and initiate the total contact cast today. Nonetheless she will require some sharp debridement to prepare the wound for application. Overall I feel like things have been progressing well but we do need to do something to get this to close more readily. 08/24/18 patient seen today for reevaluation after having had the total contact cast applied on Tuesday. She seems to have done very well the wound appears to be doing great and overall I'm pleased with the progress that she's made. There were no abnormal areas of rubbing from the cast on her lower extremity. 08/30/18 on evaluation today patient actually appears to be completely healed in regard to her plantar toe ulcer. She tells me at this point she's been having [name] lot of issues with the cast. She almost fell [name] couple of times the state shall the step  of her dog [name]  couple times as well. This is been [name] very frustrating process for her other nonetheless she has completely healed the wound which is excellent news. Overall there does not appear to be the evidence of infection at this time which is great news. 09/11/18 evaluation today patient presents for follow-up concerning her great toe ulcer on the left which has unfortunately reopened since I last saw her which was only [name] couple of weeks ago. Unfortunately she was not able to get in to get the shoe and potentially the AFO that's gonna be necessary due to her left foot drop. She continues with offloading shoe but this is not enough to prevent her from reopening it appears. When we last had her in the total contact cast she did well from [name] healing standpoint but unfortunately the wound reopened as soon as she came out of the cast within just [name] couple of weeks. 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 This is also part of the reason why she falls on [name] regular basis. I just do not believe that is gonna be safe for [place] to reinitiate the total contact cast as last time we had this on she fell 3 times one week which is definitely not normal for her. 09/18/18 upon evaluation today the patient actually appears to be doing about the same in regard to her toe ulcer. She did not contact [employer] as I asked her to even though I had given her the prescription. In fact she actually states that she has no idea where the prescription is. She did apparently call [employer] and they told her that all she needed to do was bring the prescription in order to be able to be seen and work on getting the AFO for her left foot. With all that being said she still does not have an appointment and I'm not sure were things stand that  regard. I will give her [name] new prescription today in order to contact them to get this set up. 09/25/18 on evaluation today patient actually appears to be doing about the same in regard to her toes ulcer. She does have [name] small areas which seems to have [name] lot of callous buildup around the edge of the wound which is going to need sharp debridement today. She still is waiting to be scheduled for evaluation with [employer] for possibility of an AFO. She states there supposed to call her tomorrow to get this set up. Unfortunately it does appear that her foot specifically the toe area is showing signs of erythema. There does not appear to be any systemic infection which is in these good news. 10/02/18 on evaluation today patient actually appears to be doing about the same in regard to her toe ulcer. This really has not done too well although it's not significantly larger it's also not significantly smaller. She has been tolerating the dressing changes without complication. She actually has her appointment with [employer] and [place] tomorrow to hopefully be measured for obtaining and AFO splint. I think this would be helpful preventing this from reoccurring. We had contemplated starting the cast this week although to be honest I am reluctant to do that as she's been having nausea, vomiting, and seizure activity over the past three days. She has [name] history  of seizures and have been told is nothing that can be done for these. With that being said I do believe that along with the seizures have the nausea vomiting which upon further questioning doesn't seem to be the normal for her and makes me concerned for the possibility of infection or something else going on. I discussed this with the patient and her mother during the office visit today. I do not feel the wound is effective but maybe something else. The responses this was "this just happens to her at times and we don't know why". They did not seem to be interested  in going to the hospital to have this checked out further. 10/09/18 on evaluation today patient presents for follow-up concerning her ongoing toe ulcer. She has been tolerating the dressing changes without complication. Fortunately there does not appear to be any evidence of infection which is great news however I do think that the patient would benefit from going ahead for with the total contact cast. She's actually in [name] wheelchair today she tells me that she will use her walker if we initiate the cast. I was very specific about the fact that if we were gonna do the cast I wanted to make sure that she was using the walker in order to prevent any falls. She tells me she does not have stairs that she has to traverse on [name] regular basis at her home. She has not had any seizures since last week again that something that happens to her often she tells me she did talk to [employer] and they said that it may take up to three weeks to get the brace approved for her. Hopefully that will not take that long but nonetheless in the meantime I do think the cast could be of benefit. 10/12/18 on evaluation today patient appears to be doing rather well in regard to her toe ulcer. It's just been [name] few days and already this is significantly improved both as far as overall appearance and size. Fortunately there's no sign of infection. She is here for her first obligatory cast change. 10/19/18 Seen today for follow up and management of left great toe ulcer. Wound continues to show improvement. Noted small open area with seroussang drainage with palpation. Denies any increased pain or recent fevers during visit. She will continue calcium alginate with offloading shoe. Denies any questions or concerns during visit. 10/26/18 on evaluation today patient appears to be doing about the same as when I last saw her in regard to her wound bed. Fortunately there does not appear to be any signs of infection. Unfortunately she continues to  have [name] breakdown in regard to the toe region any time that she is not in the cast. It takes almost no time at all for this to happen. Nonetheless she still has not heard anything from the brace being made by [employer] as to when exactly this will be available to her. Fortunately there is no signs of infection at this time. 10/30/18 on evaluation today patient presents for application of the total contact cast as we just received him this morning. Fortunately we are gonna be able to apply this to her today which is great news. She continues to have no significant pain which is good news. Overall I do feel like things have been improving while she was the cast is when she doesn't have [name] cast that things get worse. She still has not really heard anything from [employer] regarding her brace. 11/02/18 upon  evaluation today patient's wound already appears to be doing significantly better which is good news. Fortunately there does not appear to be any signs of infection also good news. Overall I do think the total contact cast as before is helping to heal this area unfortunately it's just not gonna likely keep the area closed and healed without her getting her brace at least. Again the foot drop is [name] significant issue for her. 11/09/18 on evaluation today patient appears to be doing excellent in regard to her toe ulcer which in fact is completely healed. Fortunately we finally got the situation squared away with the paperwork which was needed to proceed with getting her brace approved by Medicaid. I have filled that out unfortunately that information has been sent to the orthopedic office that I worked at 2 1/2 years ago and not tired Current wound care measures. Fortunately she seems to be doing very well at this time. JIMIKA, SEAWARD [name] ([id number]) [id number] Page 14 of 20 11/23/18 on evaluation today patient appears to be doing More Poorly Compared to Last Time I Saw Her. At [facility] She  Had Completely Healed. Currently she is continuing to have issues with reopening. She states that she just found out that the brace was approved through Medicaid now she just has to go get measured in order to have this fitted for her and then made. Subsequently she does not have an appointment for this yet that is going to complicate things we obviously cannot put her back in the cast if we do not have everything measured because they're not gonna be able to measure her foot while she is in the cast. 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 Obviously this is unfortunate and does have me somewhat worried as well. 11/30/18 on evaluation today patient's toe ulcer actually appears to be doing fairly well. The good news is she will be getting her brace in the shoes next week on Wednesday. Hopefully we will be able to get this to heal without having to go back in the cast however she may need the cast in order to get the wound completely heal and then go from there. Fortunately there's no signs of infection at this time. 12/07/18 on evaluation today patient fortunately did receive her brace and she states she could tell this definitely makes her walk better. With that being said she's been having issues with her toe where she noticed yesterday there was [name] lot of tissue that was loosing off this appears to be much larger than what it was previous. She also states that her leg has been read putting much across the top of her foot just about the ankle although this seems to be receiving somewhat. The total area is still red and appears to be someone infected as best I can tell. She is previously taken Bactrim and that may be [name] good option for her today as well. We are gonna see what I wound culture shows as well and I think that this is definitely appropriate. With that being said outside of the culture I still  need to initiate something in the interim and that's what I'm gonna go ahead and select Bactrim is [name] good option for her. 12/14/18 on evaluation today patient appears to be doing better in regard to her left great toe ulcer as compared to last week's evaluation. There's still  some erythema although this is significantly improved which is excellent news. Overall I do believe that she is making good progress is still gonna take some time before she is where I would like her to be from the standpoint of being able to place her back into the total contact cast. Hopefully we will be where we need to be by next week. 12/21/18 on evaluation today patient actually appears to be doing poorly in regard to her toe ulcer. She's been tolerating the dressing changes without complication. Fortunately there's no signs of systemic infection although she does have [name] lot of drainage from the toe ulcer and this does seem to be causing some issues at this point. She does have erythema on the distal portion of her toe that appears to be likely cellulitis. 12/28/18 on evaluation today patient actually appears to be doing [name] little better in my pinion in regard to her toe ulcer. With that being said she still does have some evidence of infection at this time and for her culture she had both E. coli as well as enterococcus as organisms noted on evaluation. For that reason I think that though the Keflex likely has treated the E. coli rather well this has really done nothing for the enterococcus. We are going to have to initiate treatment for this specifically. 01/04/19 on evaluation today patient's toe actually appears to be doing better from the standpoint of infection. She currently would like to see about putting the cash back on I think that this is appropriate as long as she takes care of it and keeps it from getting wet. She is gonna have some drainage we can definitely pass this up with Drawtex and alginate to try to prevent  as much drainage as possible from causing the problems. With that being said I do want to at least try her with the cast between now and Tuesday. If there any issues we can't continue to use it then I will discontinue the use of the cast at that point. 01/08/19 on evaluation today patient actually appears to be doing very well as far as her foot ulcer specifically the great toe on the left is concerned. She did have an area of rubbing on the medial aspect of her left ankle which again is from the cast. Fortunately there's no signs of infection at this point in this appears to be [name] very slight skin breakdown. The patient tells me she felt it rubbing but didn't think it was that bad. Fortunately there is no signs of active infection at this time which is good news. No fevers, chills, nausea, or vomiting noted at this time. 01/15/19 on evaluation today patient actually appears to be doing well in regard to her toe ulcer. Again as previous she seems to do well and she has the cast on which indicates to me that during the time she doesn't have [name] cast on she's putting way too much pressure on this region. Obviously I think that's gonna be an issue as with the current national emergency concerning the Covid-19 Virus it has been recommended that we discontinue the use of total contact casting by the chief medical officer of our company, Dr. Maurine Minister. The reasoning is that if [name] patient becomes sick and cannot come into have the cast removed they could not just leave this on for an additional two weeks. Obviously the hospitals also do not want to receive patient's who are sick into the emergency department to potentially contaminate the  region and spread the Covid-19 Virus among other sick individuals within the hospital system. Therefore at this point we are suspending the use of total contact cast until the current emergency subsides. This was all discussed with the patient today as well. 01/22/19 on evaluation  today patient's wound on her left great toe appears to be doing slightly worse than previously noted last week. She tells me that she has been on this quite [name] bit in fact she tells me she's been awake for 38 straight hours. This is due to the fact that she's having to care for grandparents because nobody else will. She has been taking care of them for five the last seven days since I've seen her they both have dementia his is from [name] stroke and her grandmother's was progressive. Nonetheless she states even her mom who knows her condition and situation has only help two of those days to take care of them she's been taking care of the rest. Fortunately there does not appear to be any signs of active infection in regard to her toe at this point although obviously it doesn't look as good as it did previous. I think this is directly related to her not taking off the pressure and friction by way of taking things easy. Though I completely understand what's going on. 01/29/19 on evaluation today patient's tools are actually appears to be showing some signs of improvement today compared to last week's evaluation as far as not necessarily the overall size of the wound but the fact that she has some new skin growth in between the two ends of the wound opening. Overall I feel like she has done well she states that she had [name] family member give her what sounds to be [name] CAM walker boot which has been helpful as well. 02/05/19 on evaluation today patient's wound bed actually appears to be doing significantly better in regard to her overall appearance of the size of the wound. With that being said she is still having an issue with offloading efficiently enough to get this to close. Apparently there is some signs of infection at this point as well unfortunately. Previously she's done well of Augmentin I really do not see anything that needs to be culture currently but there theme and cellulitis of the foot that I'm seeing I'm  gonna go ahead and place her on an antibiotic today to try to help clear this up. 02/12/2019 on evaluation today patient actually appears to be doing poorly in regard to her overall wound status. She tells me she has been using her offloading shoe but actually comes in today wearing her tennis shoe with the AFO brace. Again as I previously discussed with her this is really not sufficient to allow the area to heal appropriately. Nonetheless she continues to be somewhat noncompliant and I do wonder based on what she has told my nurse in the past as to whether or not [name] good portion of this noncompliance may be recreational drug and alcohol related. She has had [name] history of heroin overdose and this was fairly recently in the past couple of months that have been seeing her. Nonetheless overall I feel like her wound looks significantly worse today compared to what it was previous. She still has significant erythema despite the Augmentin I am not sure that this is an appropriate medication for her infection I am also concerned that the infection may have gone down into her bone. 02/19/19 on evaluation today patient actually appears  to be doing about the same in regard to her toe ulcer. Unfortunately she continues to show signs of bone exposure and infection at this point. There does not appear to be any evidence of worsening of the infection but I'm also not really sure that it's getting significantly better. She is on the Augmentin which should be sufficient for the Staphylococcus aureus infection that she has at this point. With that being said she may need IV antibiotics to more appropriately treat this. We did have [name] discussion today about hyperbaric option therapy. 02/28/19 on evaluation today patient actually appears to be doing much worse in regard to the wound on her left great toe as compared to even my previous evaluation last week. Unfortunately this seems to be training in [name] pretty poor direction. Her  toe was actually now starting to angle laterally and I can actually see the entire joint area of the proximal portion of the digit where is the distal portion of the digit again is no longer even in contact with the joint line. Unfortunately there's [name] lot more necrotic tissue around the edge and the toe appears to be showing signs of becoming gangrenous in my pinion. I'm very concerned about were things stand at this point. She did see infectious disease and they are planning to send in [name] prescription for Sivextro for her and apparently this has been approved. With that being said I don't think she should avoid taking this but at the same time I'm not sure that it's gonna be sufficient to save her toe at this point. She tells me that she still having to care for grandparents which I think is putting quite [name] bit of strain on her foot and specifically the total area and has caused this to break down even to [name] greater degree than would've otherwise been expected. 03/05/19 on evaluation today patient actually appears to be doing quite well in regard to her toe all things considering. She still has bone exposed but there appears to be much less your thing on overall the appearance of the wound and the toe itself is dramatically improved. She still does have some issues [name], [name] [name] ([id number]) [id number] Page 15 of 20 currently obviously with infection she did see vascular as well and there concerned that her blood flow to the toad. For that reason they are setting up for an angiogram next week. 03/14/19 on evaluation today patient appears to be doing very poor in regard to her toe and specifically in regard to the ulceration and the fact that she's starting to notice the toe was leaning even more towards the lateral aspect and the complete joint is visible on the proximal aspect of the joint. Nonetheless she's also noted [name] significant odor and the tip of the toe is turning  more dark and necrotic appearing. Overall I think she is getting worse not better as far as this is concerned. For that reason I am recommending at this point that she likely needs to be seen for likely amputation. READMISSION 03/19/2021 This is [name] patient that we cared for in this clinic for [name] prolonged period of time in 2019 and 2020 with [name] left foot and left first toe wound. 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 Excell Seltzer. In December 2021 she had an ulcer on her right great toe as well as the fourth and  fifth toes. 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 She also had an angiogram at that time and underwent angioplasty of the right anterior tibial artery. In any case she claims that the wound on the right foot is closed I did not look at this today which was probably an oversight although I think that should be done next week. 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 According to Dr. Bernette Redbird last review that she was out of the area being cared for by another physician but recently came back to his attention. 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 [name] culture of this area showed E. coli apparently before she came back to see Dr. Excell Seltzer she was supposed to be receiving antibiotics but she did not really take them. Nor is she offloading this area at all. Finally her last hemoglobin A1c listed in epic was in March 2022 at 14.1 she says things are [name] lot better since then although I am not sure. 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 She had this wound at the time. During this admission she had E. coli sepsis [name] TEE was negative. Past medical history is extensive and includes type 2 diabetes with  peripheral neuropathy cardiomyopathy with an ejection fraction of 33%, hypertension, hyperlipidemia chronic renal failure stage III history of substance abuse with cocaine although she claims to be clean now verified by her mother. She is still [name] heavy cigarette smoker. She has [name] history of bipolar disorder seizure disorder ABI in our clinic was 1.05 6/1; left midfoot in the setting of [name] TMA done previously. Round circular wound with [name] "knuckle" of protruding tissue. The problem is that the knuckle was not attached to any of the surrounding granulation and this probed proximally widely I removed [name] large portion of this tissue. This wound goes with considerable undermining laterally. I do not feel any bone there was no purulence but this is [name] deep wound. 6/8; in spite of the debridement I did last week. She arrives with [name] wound looking exactly the same. [name] protruding "knuckle" of tissue nonadherent to most of the surrounding tissue. There is considerable depth around this from 6-12 o'clock at 2.7 cm and undermining of 1 cm. This does not look overtly infected and the x- ray I did last week was negative for any osseous abnormalities. We have been using silver collagen 6/15; deep tissue culture I did last week showed moderate staph aureus and moderate Pseudomonas. This will definitely require prolonged antibiotic therapy. The pathology on the protuberant area was negative for malignancy fungus etc. 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 We have been using silver collagen. I am going to be prescribing Levaquin for 2 weeks. Her CT scan of the foot is down for 7/5 6/22; CT scan of the foot on 7 5. She says she has hardware in the left leg from her previous fracture. She is on the Levaquin for the deep tissue culture I did that showed methicillin sensitive staph aureus and Pseudomonas. I gave her [name] 2-week supply and she will have another week. She  arrives in clinic today with the same protuberant tissue however this is nonadherent to the tissue surrounding it. I am really at [name] loss to explain this unless there is underlying deep tissue infection 6/29; patient  presents for 1 week follow-up. She has been using collagen to the wound bed. She reports taking her antibiotics as prescribed.She has no complaints or issues today. She denies signs of infection. 7/6; patient presents for one week followup. She has been using collagen to the wound bed. She states she is taking Levaquin however at times she is not able to keep it down. She denies signs of infection. 7/13; patient presents for 1 week follow-up. She has been using silver alginate to the wound bed. She still has nausea when taking her antibiotics. She denies signs of infection. 7/20; patient presents for 1 week follow-up. She has been using silver alginate with gentamicin cream to the wound bed. She denies any issues and has no complaints today. She denies signs of infection. 7/27; patient presents for 1 week follow-up. She continues to use silver alginate with gentamicin cream to the wound bed. She reports starting her antibiotics. She has no issues or complaints. Overall she reports stability to the wound. 8/3; patient presents for 1 week follow-up. She has been using silver alginate with gentamicin cream to the wound bed. She reports completing all antibiotics. She has no issues or complaints today. She denies signs of infection. 8/17; patient presents for 2-week follow-up. He is to use silver alginate to the wound bed. She has no issues or complaints today. She denies signs of infection. She reports her pain has improved in her foot since last clinic visit 8/24; patient presents for 1 week follow-up. She continues to use silver alginate to the wound bed. She has no issues or complaints. She denies signs of infection. Pain is stable. 9/7; patient presents for follow-up. She missed her  last week appointment due to feeling ill. She continues to use silver alginate. She has [name] new wound to the right lower extremity that is covered in eschar. She states It occurred over the past week and has no idea how it started. She currently denies signs of infection. 9/14; patient presents for follow-up. T the left foot wound she has been using gentamicin cream and silver alginate. T the right lower extremity wound she has o o been keeping this covered and has not obtain Santyl. 9/21; patient presents for follow-up. She reports using gentamicin cream and silver alginate to the left foot and Santyl to the right lower extremity wound. She has no issues or complaints today. She denies signs of infection. 9/28; patient presents for follow-up. She reports [name] new wound to her right heel. She states this occurred [name] few days ago and is progressively gotten worse. She has been trying to clean the area with [name] Q-tip and Santyl. She reports stability in the other 2 wounds. She has been using gentamicin cream and silver alginate to the left foot and Santyl to the right lower extremity wound. 10/12; patient presents for follow-up. She reports improvement to the wound beds. She is seeing vein and vascular to discuss the potential of [name] left BKA. She states they are going to do an arteriogram. She continues to use silver alginate with dressing changes to her wounds. 11/2; patient presents for follow-up. She states she has not been doing dressing changes to the wound beds. She states she is not able to offload the areas. She reports chronic pain to her left foot wound. 11/9; patient presents for follow-up. She came in with only socks on. She states she forgot to put on shoes. It is unclear if she is doing any dressing changes. She currently denies  systemic signs of infection. 11/16; patient presents for follow-up. She came again only with socks on. She states she does not wear shoes ever. It is unclear if she does  dressing changes. She currently denies systemic signs of infection. 11/23; patient presents for follow-up. She wore her shoes today. It still unclear exactly what dressing she is using for each wound but she did states she obtained Dakin's solution and has been using this to the left foot wound. She currently denies signs of infection. 11/30; patient presents for follow-up. She has no issues or complaints today. She currently denies signs of infection. 12/7; patient presents for follow-up. She has no issues or complaints today. She has been using Hydrofera Blue to the right heel wound and Dakin solution to the left foot wound. Her right anterior leg wound is healed. She currently denies signs of infection. 12/14; patient presents for follow-up. She has been using Hydrofera Blue to the right heel and Dakin's to the left foot wounds. She has no issues or complaints PHANTASIA, DAJANI [name] ([id number]) [id number] Page 16 of 20 today. She denies signs of infection. 12/21; patient presents for follow-up. She reports using Hydrofera Blue to the right heel and Dakin's to the left foot wound. She denies signs of infection. 12/28; patient presents for follow-up. She continues to use Dakin's to the left foot wound and Hydrofera Blue to the right heel wound. She denies signs of infection. 1/4; patient presents for follow-up. She has no issues or complaints today. She denies signs of infection. 1/11; patient presents for follow-up. It is unclear if she has been dressing these wounds over the past week. She currently denies signs of infection. 1/18; patient presents for follow-up. She states she has been using Dakin's wet-to-dry dressings to the left foot. She has been using Hydrofera Blue to the right foot foot wound. She states that the anterior right leg wound has reopened and draining serous fluid. She denies signs of infection. 1/25; patient presents for follow-up. She has no issues or  complaints today. 2/1; patient presents for follow-up. She has no issues or complaints today. She denies signs of infection. 2/8; patient presents for follow-up. She has lost her surgical shoes. She did not have [name] dressing to the right heel wound. She currently denies signs of infection. 2/15; patient presents for follow-up. She reports more pain to the right heel today. She denies purulent drainage Or fever/chills 2/22; patient presents for follow-up. She reports taking clindamycin over the past week. She states that she continues to have pain to her right heel. She reports purulent drainage. Readmission 03/16/2022 [name] is [name] 47 year old female with [name] past medical history of type 2 diabetes, osteomyelitis to her feet, chronic systolic heart failure and bipolar disorder that presents to the clinic for bilateral feet wounds and right lower extremity wound. She was last seen in our clinic on 12/15/2021. At that time she had purulent drainage coming out of her right plantar foot and I recommended she go to the ED. She states she went to [facility] and has been there for the past 3 months. I cannot see the records. She states she had OR debridement and was on several weeks of IV antibiotics while inpatient. Since discharge she has not been taking care of the wound beds. She had nothing on her feet other than socks today. She currently denies signs of infection. 5/31; patient presents for follow-up. She has been using Dakin's wet-to-dry dressings to the wound beds on  her feet bilaterally and antibiotic ointment to the right anterior leg wound. She had [name] wound culture done at last clinic visit that showed moderate Pseudomonas aeruginosa sensitive to ciprofloxacin. She currently denies systemic signs of infection. 6/14; patient presents for follow-up. She received Keystone 5 days ago and has been using this on the wound beds. She states that last week she had to go to the hospital  because she had increased warmth and erythema to the right foot. She was started on 2 oral antibiotics. She states she has been taking these. She currently denies systemic signs of infection. She has no issues or complaints today. 6/21; patient presents for follow-up. She states she has been using Keystone antibiotics to the wound beds. She has no issues or complaints today. She denies signs of infection. 6/28; patient presents for follow-up. She has been using Keystone antibiotics to the wound beds. She has no issues or complaints today. 7/12; patient presents for follow-up. Has been using Keystone antibiotics to the wound beds with calcium alginate. She has no issues or complaints today. She never followed up with her orthopedic surgeon who did the OR debridement to the right foot. We discussed the total contact cast for the left foot and patient would like to do this next week. 7/19; patient presents for follow-up. She has been using Keystone antibiotics with calcium alginate to the wound beds. She has no issues or complaints today. Patient is in agreement to do the total contact cast of the left foot today. She knows to return later this week for the obligatory cast change. 05-13-2022 upon evaluation today patient's wound which she has the cast of the left leg actually appears to be doing significantly better. Fortunately I do not see any signs of active infection locally or systemically which is great news and overall I am extremely pleased with where we stand currently. 7/26; patient presents for follow-up. She has [name] cast in place for the past week. She states it irritated her shin. Other than that she tolerated the cast well. She states she would like [name] break for 1 week from the cast. We have been using Keystone antibiotic and Aquacel to both wound beds. She denies signs of infection. 8/2; patient presents for follow-up. She has been using Keystone and Aquacel to the wound beds. She denies any  issues and has no complaints. She is agreeable to have the cast placed today for the left leg. 06-03-2022 upon evaluation today patient appears to be doing well with regard to her wound she saw some signs of improvement which is great news. Fortunately I do not see any evidence of active infection locally or systemically at this time which is great news. No fevers, chills, nausea, vomiting, or diarrhea. 8/16; patient presents for follow-up. She has no issues or complaints today. We have been using Keystone and Aquacel to the wound beds. The left lower extremity is in [name] total contact cast. She is tolerated this well. 8/23; patient presents for follow-up. She has had the total contact cast on the left leg for the past week. Unfortunately this has rubbed and broken down the skin to the medial foot. She currently denies signs of infection. She has been using Keystone antibiotic to the right plantar foot wound. 8/30; patient presents for follow-up. We have held off on the total contact cast for the left leg for the past week. Her wound on the left foot has improved and the previous surrounding breakdown of skin has epithelialized. She  has been using Keystone antibiotic to both wound beds. She has no issues or complaints today. She denies signs of infection. 9/6; patient presents for follow-up. She has ordered her's Keystone antibiotic and this is arriving this week. She has been doing Dakin's wet-to-dry dressings to the wound beds. She denies signs of infection. She is agreeable to the total contact cast today. 9/13; patient presents for follow-up. She states that the cast caused her left leg shin to be sore. She would like to take [name] break from the cast this week. She has been using Keystone antibiotic to the right plantar foot wound. She denies signs of infection. 9/20; patient presents for follow-up. She has been using Keystone antibiotics to the wound beds with calcium alginate to the right foot wound  and Hydrofera Blue to the left foot wound. She is agreeable to having the cast placed today. She has been approved for Apligraf and we will order this for next clinic visit. 9/27; patient presents for follow-up. We have been using Keystone antibiotic with Hydrofera Blue to the left foot wound under [name] total contact cast. T the right o foot wound she has been using Keystone antibiotic and calcium alginate. She declines [name] total contact cast today. Apligraf is available for placement and she would like to proceed with this. 07-28-2022 upon evaluation today patient appears to be doing well currently in regard to her wound. She is actually showing signs of significant improvement which is great news. Fortunately I do not see any evidence of active infection locally nor systemically at this time. She has been seeing Dr. Mikey Bussing and to DAFNE, TORRE [name] ([id number]) [id number] Page 17 of 20 be honest has been doing very well with the cast. Subsequently she comes in today with [name] cast on and we did reapply that today as well. She did not really want to she try to talk me out of that but I explained that if she wanted to heal this is really the right way to go. Patient voiced understanding. In regard to her right foot this is actually [name] lot better compared to the last time I saw her which is also great news. 10/11; patient presents for follow-up. Apligraf and the total contact cast was placed to the left leg at last clinic visit. She states that her right foot wound had burning pain to it with the placement of Apligraf to this area. She has been doing [place] over this area. She denies signs of infection including increased warmth, erythema or purulent drainage. 11/1; 3-week follow-up. The patient fortunately did not have [name] total contact cast or an Apligraf and on the left foot. She has been using Keystone ABD pads and kerlix and her own running shoes She arrives in clinic today with  thick callus and [name] very poor surface on the left foot on the right nonviable skin subcutaneous tissue and [name] deep probing hole. 11/15; patient missed her last clinic appointment. She states she has not been dressing the wound beds for the past 2 weeks. She states that at she had [name] new roommate but is now going back to live with her mother. Apparently its been [name] distracting 2 weeks. Patient currently denies signs of infection. 11/22; patient presents for follow-up. She states she has been using Keystone antibiotic and Dakin's wet-to-dry dressings to the wound beds. She is agreeable for cast placement today. We had ordered Apligraf however this has not been received by our facility. 11/29; Patient had [name] total contact cast  placed at last clinic visit and she tolerated this well. We were using silver alginate under the cast. Patient's been using Keystone antibiotic with Aquacel to the right plantar foot wound. She has no issues or complaints today. Apligraf is available for placement today. Patient would like to proceed with this. 12/6; patient presents for follow-up. She had Apligraf placed in standard fashion last clinic visit under the total contact cast to the left lower extremity. She has been using Keystone antibiotic and Aquacel to the right plantar foot wound. She has no issues or complaints today. 12/13; patient presents for follow-up. She has finished 5 Apligraf placements. Was told she would not qualify for more. We have been doing [name] total contact cast to the left lower extremity. She has been using Keystone antibiotic and Aquacel to the right plantar foot wound. She has no issues or complaints today. 12/20; patient presents for follow-up. We have been using Hydrofera Blue with Keystone antibiotic under [name] total contact cast of the left lower extremity. She reports using Keystone antibiotic and silver alginate to the right heel wound. She has no issues or complaints today. 12/27; patient presents  with [name] healthy wound on the left midfoot. We have Apligraf to apply that to that more also using [name] total contact cast. On the right we are using Keystone and silver alginate. She is offloading the right heel with [name] surgical shoealthough by her admission she is on her feet quite [name] bit 1/3; patient presents for follow-up. Apligraf was placed to the wound beds last clinic visit. She was placed in [name] total contact cast to the left lower extremity. She declines [name] total contact cast today. She states that her mother is in the hospital and she cannot adequately get around with the cast on. 1/10; patient presents for follow-up. She declined the total contact cast at last clinic visit. Both wounds have declined in appearance. She states that she has been on her feet and not offloading the wound beds. She currently denies signs of infection. 1/17; patient presents for follow-up. She had the total contact cast along with Apligraf placed last week to the left lower extremity. She tolerated this well. She has been using Aquacel Ag and Keystone antibiotic to the right heel wound. She currently denies signs of infection. She has no issues or complaints today. 1/24; patient presents for follow-up. We have been using Apligraf to the left foot wound along with [name] total contact cast. She has done well with this. T the right o heel wound she has been using Aquacel Ag and Keystone antibiotic ointment. She has no issues or complaints today. She denies signs of infection. 1/31; patient presents for follow-up. We have been using Apligraf to the left foot wound along with the total contact cast. She continues to do well with this. To the right heel we have been using Aquacel Ag and Keystone antibiotic ointment. She has no issues or complaints today. 12-01-2022 upon evaluation patient is seen today on my schedule due to the fact that she unfortunately was in the hospital yesterday. Her cast needed to come off the only reason she is  out of the hospital is due to the fact that they would not take it off at the hospital which is somewhat bewildered reading to me to be perfectly honest. I am not certain why this was but either way she was released and then was placed on my schedule today in order to get this off and reapply the total  contact casting as appropriate. I do not have an Apligraf for her today it was applied last week and today's actually expired yesterday as there was some scheduling conflicts with her being in the hospital. Nonetheless we do not have that for reapplication today but the good news that she is not draining too much and the Apligraf can go for up to 2 weeks so I am going to go ahead and reapply the total contact casting but we are going to leave the Apligraf in place. 2/14; patient presents for follow-up. T the left leg she has had the total contact cast and Apligraf for the past week. She has had no issues with this. T the o o right heel she has been using antibiotic ointment and Aquacel Ag. 2/21; patient presents for follow-up. We have been using Apligraf and [name] total contact cast to the left lower extremity. She is tolerated this well. Unfortunately she is not approved for any more Apligraf per insurance. She has been using antibiotic ointment and Aquacel Ag to the right foot. She has no issues or complaints today. 2/28; patient presents for follow-up. We have been using Hydrofera Blue and antibiotic ointment under the total contact cast to the left lower extremity. He has been using Aquacel Ag with antibiotic ointment to the right plantar foot. She has no issues or complaints today. 3/6; patient presents for follow-up. She did not obtain her gentamicin ointment. She has been using Aquacel Ag to the right plantar foot wound. We have been using Hydrofera Blue with antibiotic ointment under the total contact cast to the left lower extremity. She has no issues or complaints today. 3/12; patient presents for  follow-up. She has been using gentamicin ointment and Aquacel Ag to the right plantar foot wound. We have been using Hydrofera Blue with antibiotic ointment under the total contact cast on the left lower extremity foot wound. She has no issues or complaints today. 3/20; patient presents for follow-up. She has been using gentamicin ointment and Aquacel Ag to the right plantar foot wound. We have been using Hydrofera Blue with antibiotic ointment under the total contact cast to the left lower extremity wound. She has no issues or complaints today. 3/27; patient presents for follow-up. We have been using antibiotic ointment and Aquacel Ag to the right plantar foot wound. We have been using Hydrofera Blue with antibiotic ointment under the total contact cast to the left lower extremity. Both wounds are smaller. 01-24-2023 upon evaluation today patient actually appears to be doing better compared to last time I saw her. It has been several weeks since I last saw her but nonetheless I think the total contact casting is doing [name] good job. Fortunately I do not see any evidence of infection or worsening overall which is great news and in general I do believe that she is moving in the appropriate direction. It has been [name] very long road but nonetheless I feel like she is nearing the end at least in regard to the left foot and even the right foot looks much better to be perfectly honest. 01-31-2023 upon evaluation today patient appears to be doing well currently in regard to her wounds. She has been require some sharp debridement which I think is probably bone both sides to remove [name] lot of callus that has built up at this point. I discussed that with her today. Also think total contact cast is doing well for the left foot Emina continue that as well. 4/17; patient presents  for follow-up. We have been doing Hydrofera Blue with antibiotic ointment under the total contact cast to the left lower extremity and Aquacel Ag  with antibiotic ointment to the right foot wound. She has been using her offloading heel shoe here. 4/24; patient presents for follow-up. We have been doing Hydrofera Blue with antibiotic ointment under the total contact cast to the left lower extremity and Navarette, Nadalie [name] ([id number]) [id number] Page 18 of 20 Aquacel Ag with antibiotic ointment to the right foot wound. She has been using her offloading heel shoe here. Wounds are smaller with slough accumulation. 5/1; patient presents for follow-up. We have been doing Hydrofera Blue with antibiotic ointment under the total contact cast to the left lower extremity and Aquacel Ag with antibiotic ointment to the right foot wound. Left foot wound is stable and right foot wound appears smaller. 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 On the left she is using Hydrofera Blue topical antibiotic under [name] total contact cast, on the right plantar heel gentamicin Aquacel Ag. According to our intake nurse both are making slow but steady improvements. 5/15; patient presents for follow-up. We have been doing Hydrofera Blue with antibiotic ointment under the total contact cast on the left and antibiotic ointment with Aquacel Ag on the right. Unfortunately the left foot wound has declined in size and appearance. We also do not have the total contact cast available in office today. Patient denies signs of infection. 5/22; patient presents for follow-up. We have been doing Hydrofera Blue and antibiotic ointment to the left foot and Aquacel Ag with antibiotic ointment to the right foot. We took [name] break from the cast since we did not have it placed at last week. Wounds look stable if not slightly improved. 03-27-2023 patient presents today she has had this For Most 2 Weeks Because She Was in the Hospital and They Did Not T It off. Again That Is Really Not ake Something That She Could Control but Nonetheless We Are Seeing Her at This Point  Today for Reevaluation. We Do Want to Go and See about Reapply the Cast. Again We Had All Set Everything up to Go Ahead and Do This When We Went to Get the Cast Only to Realize That Not Had Come in and We Were out at the Moment. With That Being [place] Spaniel We Will Get Have T Get the Exam before We Can Reapply the T Contact Casting Going Forward. o otal 6/12; patient presents for follow-up. She has been using Hydrofera Blue to the left foot wound and Aquacel Ag to the right foot wound. No cast was available last week placed on the patient so she has been offloading with her surgical shoe. She has no issues or complaints today. 6/19; patient presents for follow-up. We have been using Hydrofera Blue to the left foot wound under the total contact cast and Aquacel Ag to the right wound. She has no issues or complaints today. The wounds are smaller. 6/26; patient presents for follow-up. We have been using Hydrofera Blue to the left foot wound under the total contact cast and Aquacel Ag to the right foot wound. Wounds are smaller. Patient has no issues or complaints today. 7/3; patient presents for follow-up. We have been using Hydrofera Blue to the left foot wound under the total contact cast and Aquacel Ag to the right foot wound. Wounds Appear smaller. No signs of infection. 7/10; patient presents for follow-up. We have been using Hydrofera Blue to the left foot wound  under the total contact cast and Aquacel Ag to the right foot wound. Left foot wound is smaller. Right foot wound is stable. 7/17; patient presents for follow-up. We have been using Hydrofera Blue to the left foot under the total contact cast and Aquacel Ag to the right foot wound. She has no issues or complaints today. 7/24; patient presents for follow-up. We have been using Hydrofera Blue to the left foot under the total contact cast and Aquacel Ag to the right foot wound. Unfortunately she was bit by her dog over the weekend and developed [name]  wound to the right lower leg. She has been keeping the area covered. 7/31; patient presents for follow-up. We have been using Hydrofera Blue to the left foot under the total contact cast and Aquacel Ag to the right foot wound and mupirocin ointment to the right anterior leg wound. She started her oral antibiotics 2 days ago. 8/7; patient presents for follow-up. We have been using Hydrofera Blue and antibiotic ointment to the left foot under the total contact cast and Aquacel Ag to the right foot wound. Antibiotic ointment to the right anterior leg wound. She completed her oral antibiotics. She has no issues or complaints today. Wounds continue to improve albeit slowly. 8/14; 8/14; patient has 3 wounds 1 on the left plantar heel 1 on the right plantar heel and [name] small wound on the right anterior lower leg. We have been using Hydrofera Blue in the left heel Aquacel on the right heel. The left heel has been in [name] total contact cast Objective Constitutional Sitting or standing Blood Pressure is within target range for patient.. Pulse regular and within target range for patient.Marland Kitchen Respirations regular, non-labored and within target range.. Temperature is normal and within the target range for the patient.Marland Kitchen Appears chronically unwell. Vitals Time Taken: 11:21 AM, Height: 69 in, Weight: 178 lbs, BMI: 26.3, Temperature: 98 F, Pulse: 58 bpm, Respiratory Rate: 18 breaths/min, Blood Pressure: 129/80 mmHg. Gastrointestinal (GI) Abdomen is soft and non-distended without masses or tenderness. Bowel sounds active in all quadrants.. General Notes: Wound exam  Right foot this is just about healed somewhat irregular epithelialization but very close to being totally close  Similarly on the left foot the wound is just about close there is [name] lot of callus here that I removed with [name] #5 curette but the wound is very superficial  On the right anterior lower leg superficial wound granulation appears to be  healthy. Integumentary (Hair, Skin) Wound #11 status is Open. Original cause of wound was Surgical Injury. The date acquired was: 12/01/2021. The wound has been in treatment 64 weeks. The wound is located on the Right,Plantar Foot. The wound measures 0.1cm length x 0.1cm width x 0.1cm depth; 0.008cm^2 area and 0.001cm^3 volume. There is no tunneling or undermining noted. There is [name] none present amount of drainage noted. The wound margin is distinct with the outline attached to the wound base. There is no granulation within the wound bed. There is no necrotic tissue within the wound bed. Wound #12 status is Open. Original cause of wound was Pressure Injury. The date acquired was: 03/16/2020. The wound has been in treatment 64 weeks. The wound is located on the Left,Medial,Plantar Foot. The wound measures 0.1cm length x 0.1cm width x 0.1cm depth; 0.008cm^2 area and 0.001cm^3 volume. There is no tunneling or undermining noted. There is [name] none present amount of drainage noted. The wound margin is flat and intact. There is no granulation within the wound  bed. There is no necrotic tissue within the wound bed. Wound #14 status is Open. Original cause of wound was Bite. The date acquired was: 05/14/2023. The wound has been in treatment 3 weeks. The wound is located on the Right,Anterior Lower Leg. The wound measures 1.1cm length x 0.5cm width x 0.1cm depth; 0.432cm^2 area and 0.043cm^3 volume. There is Fat Layer (Subcutaneous Tissue) exposed. There is no tunneling or undermining noted. There is [name] small amount of serosanguineous drainage noted. The wound KELLIS, FLOREY [name] ([id number]) [id number] Page 19 of 20 margin is distinct with the outline attached to the wound base. There is large (67-100%) red granulation within the wound bed. There is no necrotic tissue within the wound bed. 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 There was [name] Selective/Open Wound Non-Viable Tissue Debridement with [name] total area of 0.01 sq cm performed by Maxwell Caul, MD. With the following instrument(s): Curette to remove Viable and Non-Viable tissue/material. Material removed includes Callus after achieving pain control using Lidocaine 4% Topical Solution. No specimens were taken. [name] time out was conducted at 11:46, prior to the start of the procedure. There was no bleeding. The procedure was tolerated well. Post Debridement Measurements: 0.1cm length x 0.1cm width x 0.1cm depth; 0.001cm^3 volume. Character of Wound/Ulcer Post Debridement is stable. Severity of Tissue Post Debridement is: Limited to breakdown of skin. Post procedure Diagnosis Wound #12: Same as Pre-Procedure Plan 1. The patient has wounds on her bilateral heels. Fortunately both of these are very close to healing including the one that is receiving the total contact cast. I think this probably should be closed by next week. Similarly on the right everything looks very close to closed here as well. 2. The area on the right anterior lower leg is small and superficial but probably will require more prolonged treatment 3. The patient complains of unrelenting nausea with some vomiting. By her description she  has had GI evaluation including [name] recent endoscopy and nothing was found. She has stage IV chronic renal failure. 4. I did not change any of the current treatments we put the left foot back in [name] cast as mentioned I think this should be closed by next week Electronic Signature(s) Signed: 06/07/2023 4:20:15 PM By: Baltazar Najjar MD Entered By: Baltazar Najjar on 06/07/2023 12:26:28 -------------------------------------------------------------------------------- Total Contact Cast Details Patient Name: Date of Service: [name] 06/07/2023 11:00 [name]: [id number] Patient Account Number: [id number] Date of Birth/Sex: Treating RN: [birth year] (46 y.o. [name] Primary Care Provider: [name] Other Clinician: Referring Provider: Treating Provider/Extender: RO BSO N, MICHA EL Adela Lank, [name] Weeks in Treatment: [phone] T Contact Cast Applied for Wound Assessment: otal Wound #12 Left,Medial,Plantar Foot JAIYANNA, SMITHWICK [name] ([id number]) [id number].pdf Page 20 of 20 Performed By: Physician Maxwell Caul, MD Post Procedure Diagnosis Same as  Pre-procedure Electronic Signature(s) Signed: 06/07/2023 1:45:47 PM By: Angelina Pih Signed: 06/07/2023 4:20:15 PM By: Baltazar Najjar MD Entered By: Angelina Pih on 06/07/2023 13:45:47 -------------------------------------------------------------------------------- SuperBill Details Patient Name: Date of Service: [name] 06/07/2023 Medical Record Number: [id number] Patient Account Number: [id number] Date of Birth/Sex: Treating RN: 08-13-1976 (47 y.o. [name] Primary Care Provider: [name] Other Clinician: Referring Provider: Treating Provider/Extender: RO BSO N, MICHA EL Adela Lank, [name] Weeks in Treatment: 64 Diagnosis Coding ICD-10 Codes Code Description [id number] Non-pressure chronic ulcer of other part of left foot with other specified severity L97.512  Non-pressure chronic ulcer of other part of right foot with fat layer exposed E11.621 Type 2 diabetes mellitus with foot ulcer L97.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 of skin W54.0XXA Bitten by dog, initial encounter S81.801A Unspecified open wound, right lower leg, initial encounter E11.622 Type 2 diabetes mellitus with other skin ulcer E11.42 Type 2 diabetes mellitus with diabetic polyneuropathy Z79.4 Long term (current) use of insulin Facility Procedures : CPT4 Code: [id number] Description: 97597 - DEBRIDE WOUND 1ST 20 SQ CM OR < ICD-10 Diagnosis Description 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i Modifier: ty Quantity: 1 Physician Procedures : CPT4 Code Description Modifier [id number] 97597 - WC PHYS DEBR WO ANESTH 20 SQ CM ICD-10 Diagnosis Description 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ity Quantity: 1 Electronic Signature(s) Signed: 06/07/2023 4:20:15 PM By: Baltazar Najjar MD Entered By: Baltazar Najjar on 06/07/2023 12:28:04

## 2023-06-08 NOTE — Progress Notes (Signed)
Heather Heather Heather ([id number]) [id number] Page 1 of 12 Visit Report for Heather Arrival Information Details Patient Name: Date of Service: Heather Heather, Heather Heather Heather 11:00 Heather Heather Heather: [id number] Patient Account [name]: [id number] Date of Birth/Sex: Treating RN: 1975/11/15 (47 y.o. Heather Heather Primary Care : Heather Heather Other Clinician: Referring : Treating /Extender: Heather Heather, Heather Heather Heather Heather, Heather Heather in Treatment: 64 Visit Information History Since Last Visit Added or deleted any medications: No Patient Arrived: Wheel Chair Any new allergies or adverse reactions: No Arrival Time: 11:16 Had Heather fall or experienced change in No Accompanied By: family activities of daily living that may affect Transfer Assistance: None risk of falls: Patient Identification Verified: Yes Hospitalized since last visit: No Secondary Verification Process Completed: Yes Has Dressing in Place as Prescribed: Yes Patient Requires Transmission-Based Precautions: No Has Footwear/Offloading in Place as Prescribed: Yes Patient Has Alerts: No Left: T Contact Cast otal Pain Present Now: No Electronic Signature(s) Signed: 06/07/2023 4:42:29 PM By: Heather Heather Entered By: Heather Heather on 06/07/2023 11:20:58 -------------------------------------------------------------------------------- Clinic Level of Care Assessment Details Patient Name: Date of Service: [name], Heather Heather 11:00 Heather Heather Heather: [id number] Patient Account [name]: [id number] Date of Birth/Sex: Treating RN: 11/01/1975 (47 y.o. Heather Heather Primary Care : Heather Heather Other Clinician: Referring : Treating /Extender: Heather Heather, Heather Heather Heather Heather, Heather Heather in Treatment: 64 Clinic Level of Care Assessment Items TOOL 1 Quantity Score []  - 0 Use when EandM and Procedure is performed on INITIAL  visit ASSESSMENTS - Nursing Assessment / Reassessment []  - 0 General Physical Exam (combine w/ comprehensive assessment (listed just below) when performed on new pt. evals) []  - 0 Comprehensive Assessment (HX, ROS, Risk Assessments, Wounds Hx, etc.) ASSESSMENTS - Wound and Skin Assessment / Reassessment []  - 0 Dermatologic / Skin Assessment (not related to wound area) Heather Heather, Heather Heather ([id number]) [id number] Page 2 of 12 ASSESSMENTS - Ostomy and/or Continence Assessment and Care []  - 0 Incontinence Assessment and Management []  - 0 Ostomy Care Assessment and Management (repouching, etc.) PROCESS - Coordination of Care []  - 0 Simple Patient / Family Education for ongoing care []  - 0 Complex (extensive) Patient / Family Education for ongoing care []  - 0 Staff obtains Chiropractor, Records, T Results / Process Orders est []  - 0 Staff telephones HHA, Nursing Homes / Clarify orders / etc []  - 0 Routine Transfer to another Facility (non-emergent condition) []  - 0 Routine Hospital Admission (non-emergent condition) []  - 0 New Admissions / Manufacturing engineer / Ordering NPWT Apligraf, etc. , []  - 0 Emergency Hospital Admission (emergent condition) PROCESS - Special Needs []  - 0 Pediatric / Minor Patient Management []  - 0 Isolation Patient Management []  - 0 Hearing / Language / Visual special needs []  - 0 Assessment of Community assistance (transportation, D/C planning, etc.) []  - 0 Additional assistance / Altered mentation []  - 0 Support Surface(s) Assessment (bed, cushion, seat, etc.) INTERVENTIONS - Miscellaneous []  - 0 External ear exam []  - 0 Patient Transfer (multiple staff / Nurse, adult / Similar devices) []  - 0 Simple Staple / Suture removal (25 or less) []  - 0 Complex Staple / Suture removal (26 or more) []  - 0 Hypo/Hyperglycemic Management (do not check if billed separately) []  - 0 Ankle / Brachial Index (ABI) - do not check if  billed separately Has the patient been seen at the hospital within the last three years: Yes Total Score: 0 Level  Of Care: ____ Electronic Signature(s) Signed: 06/07/2023 4:42:29 PM By: Heather Heather Entered By: Heather Heather on 06/07/2023 13:52:27 -------------------------------------------------------------------------------- Encounter Discharge Information Details Patient Name: Date of Service: Heather Heather. Heather 11:00 Heather Heather Heather: [id number] Patient Account [name]: [id number] Date of Birth/Sex: Treating RN: [birth year] (47 y.o. Heather Heather Primary Care : Heather Heather Other Clinician: Referring : Treating /Extender: Heather Heather, Heather Heather Bonna Gains in Treatment: [phone] Encounter Discharge Information Items Post Procedure Heather Heather Heather ([id number]) [id number] Page 3 of 12 Discharge Condition: Stable Temperature (F): 98 Ambulatory Status: Wheelchair Pulse (bpm): 58 Discharge Destination: Home Respiratory Rate (breaths/min): 18 Transportation: Private Auto Blood Pressure (mmHg): 129/80 Accompanied By: mother Schedule Follow-up Appointment: Yes Clinical Summary of Care: Electronic Signature(s) Signed: 06/07/2023 1:54:09 PM By: Heather Heather Entered By: Heather Heather on 06/07/2023 13:54:09 -------------------------------------------------------------------------------- Lower Extremity Assessment Details Patient Name: Date of Service: Heather Heather Heather 11:00 Heather Heather Heather: [id number] Patient Account [name]: [id number] Date of Birth/Sex: Treating RN: 1976-06-28 (47 y.o. Heather Heather Primary Care : Heather Heather Other Clinician: Referring : Treating /Extender: Heather Heather, Heather Heather Heather Heather, Heather Heather in Treatment: 64 Vascular Assessment Pulses: Dorsalis Pedis Palpable: [Left:Yes] Posterior Tibial Palpable:  [Left:Yes] Extremity colors, hair growth, and conditions: Extremity Color: [Left:Normal] Hair Growth on Extremity: [Left:Yes] Temperature of Extremity: [Left:Warm < 3 seconds] Toe Nail Assessment Left: Right: Thick: No Discolored: No Deformed: No Improper Length and Hygiene: No Electronic Signature(s) Signed: 06/07/2023 1:44:35 PM By: Heather Heather Entered By: Heather Heather on 06/07/2023 13:44:35 Heather Heather ([id number]) [id number] Page 4 of 12 -------------------------------------------------------------------------------- Multi Wound Chart Details Patient Name: Date of Service: NYESHA, DOTT Heather 11:00 Heather Heather Heather: [id number] Patient Account [name]: [id number] Date of Birth/Sex: Treating RN: 1976/04/06 (47 y.o. Heather Heather Primary Care : Heather Heather Other Clinician: Referring : Treating /Extender: Heather Heather, Heather Heather Heather Heather, Heather Heather in Treatment: 64 Vital Signs Height(in): 69 Pulse(bpm): 58 Weight(lbs): 178 Blood Pressure(mmHg): 129/80 Body Mass Index(BMI): 26.3 Temperature(F): 98 Respiratory Rate(breaths/min): 18 [11:Photos:] Right, Plantar Foot Left, Medial, Plantar Foot Right, Anterior Lower Leg Wound Location: Surgical Injury Pressure Injury Bite Wounding Event: Open Surgical Wound Diabetic Wound/Ulcer of the Lower Trauma, Other Primary Etiology: Extremity [name]/[name]/Heather 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 Chronic sinus problems/congestion, Chronic sinus problems/congestion, Chronic sinus problems/congestion, Comorbid History: Middle ear problems, Anemia, Chronic Middle ear problems, Anemia, Chronic Middle ear problems, Anemia, Chronic Obstructive Pulmonary Disease Obstructive Pulmonary Disease Obstructive Pulmonary Disease (COPD), Congestive Heart Failure, (COPD), Congestive Heart Failure, (COPD), Congestive Heart Failure, Type II Diabetes, End  Stage Renal Type II Diabetes, End Stage Renal Type II Diabetes, End Stage Renal Disease, History of pressure wounds, Disease, History of pressure wounds, Disease, History of pressure wounds, Neuropathy Neuropathy Neuropathy 12/01/2021 03/16/2020 05/14/2023 Date Acquired: 58 64 3 [name] of Treatment: Open Open Open Wound Status: No No No Wound Recurrence: 0.1x0.1x0.1 0.1x0.1x0.1 1.1x0.5x0.1 Measurements L x W x D (cm) 0.008 0.008 0.432 Heather (cm) : rea 0.001 0.001 0.043 Volume (cm) : 99.60% 99.80% 71.00% % Reduction in Heather rea: 99.90% 99.90% 85.60% % Reduction in Volume: Full Thickness Without Exposed Grade 3 Full Thickness Without Exposed Classification: Support Structures Support Structures None Present None Present Small Exudate Heather mount: [name]/[name]/Heather Serosanguineous Exudate Type: [name]/[name]/Heather red, brown Exudate Color: Distinct, outline attached Flat and Intact Distinct, outline attached Wound Margin: None Present (0%) None Present (0%)  Large (67-100%) Granulation Heather mount: [name]/[name]/Heather Red Granulation Quality: None Present (0%) None Present (0%) None Present (0%) Necrotic Heather mount: Fascia: No Fascia: No Fat Layer (Subcutaneous Tissue): Yes Exposed Structures: Fat Layer (Subcutaneous Tissue): No Fat Layer (Subcutaneous Tissue): No Fascia: No Tendon: No Tendon: No Tendon: No Muscle: No Muscle: No Muscle: No Joint: No Joint: No Joint: No Bone: No Bone: No Bone: No Medium (34-66%) None None Epithelialization: [name]/Heather Debridement - Selective/Open Wound [name]/Heather Debridement: Pre-procedure Verification/Time Out [name]/Heather 11:46 [name]/Heather Taken: [name]/Heather Lidocaine 4% Topical Solution [name]/Heather Pain Control: [name]/Heather Callus [name]/Heather Tissue Debrided: [name]/Heather Non-Viable Tissue [name]/Heather Level: [name]/Heather 0.01 [name]/Heather Debridement Heather (sq cm): rea [name]/Heather Curette [name]/Heather Instrument: [name]/Heather None [name]/Heather Bleeding: [name]/Heather Procedure was tolerated well [name]/Heather Debridement Treatment Response: [name]/Heather 0.1x0.1x0.1 [name]/Heather Post Debridement Measurements L x W x D (cm) [name]/Heather 0.001  [name]/Heather Post Debridement Volume: (cm) [name]/Heather Debridement [name]/Heather Procedures Performed: Heather Heather, Heather Heather ([id number]) [id number] Page 5 of 12 Treatment Notes Electronic Signature(s) Signed: 06/07/2023 1:45:06 PM By: Heather Heather Entered By: Heather Heather on 06/07/2023 13:45:05 -------------------------------------------------------------------------------- Multi-Disciplinary Care Plan Details Patient Name: Date of Service: Heather Heather. Heather 11:00 Heather Heather Heather: [id number] Patient Account [name]: [id number] Date of Birth/Sex: Treating RN: [birth year] (47 y.o. Heather Heather Primary Care : Heather Heather Other Clinician: Referring : Treating /Extender: Heather Heather, Heather Heather Heather Heather, Heather Heather in Treatment: 19 Active Inactive Abuse / Safety / Falls / Self Care Management Nursing Diagnoses: History of Falls Potential for falls Potential for injury related to falls Goals: Patient will not develop complications from immobility Date Initiated: 03/17/2022 Date Inactivated: 04/20/2022 Target Resolution Date: 03/16/2022 Goal Status: Met Patient/caregiver will verbalize understanding of skin care regimen Date Initiated: 03/17/2022 Date Inactivated: 09/28/2022 Target Resolution Date: 03/16/2022 Goal Status: Met Patient/caregiver will verbalize/demonstrate measure taken to improve self care Date Initiated: 03/17/2022 Target Resolution Date: 06/21/2023 Goal Status: Active Interventions: Assess fall risk on admission and as needed Provide education on basic hygiene Provide education on personal and home safety Notes: Electronic Signature(s) Signed: 06/07/2023 1:52:54 PM By: Heather Heather Entered By: Heather Heather on 06/07/2023 13:52:54 Heather Heather ([id number]) [id number] Page 6 of 12 -------------------------------------------------------------------------------- Pain Assessment  Details Patient Name: Date of Service: NYARIE, FOCHTMAN Heather 11:00 Heather Heather Heather: [id number] Patient Account [name]: [id number] Date of Birth/Sex: Treating RN: 06/20/1976 (47 y.o. Heather Heather Primary Care : Heather Heather Other Clinician: Referring : Treating /Extender: Heather Heather, Heather Heather Heather Heather, Heather Heather in Treatment: 64 Active Problems Location of Pain Severity and Description of Pain Patient Has Paino No Site Locations Rate the pain. Current Pain Level: 0 Pain Management and Medication Current Pain Management: Electronic Signature(s) Signed: 06/07/2023 4:42:29 PM By: Heather Heather Entered By: Heather Heather on 06/07/2023 11:22:35 -------------------------------------------------------------------------------- Patient/Caregiver Education Details Patient Name: Date of Service: Heather Heather 8/14/2024andnbsp11:00 Heather Heather Heather: [id number] Patient Account [name]: [id number] Date of Birth/Gender: Treating RN: 1976-08-11 (47 y.o. Heather Heather Primary Care Physician: Heather Heather Other Clinician: Referring Physician: Treating Physician/Extender: Heather BSO Dorris Carnes, Heather Heather Heather Heather, Heather Heather in Treatment: [address] Giulia Heather ([id number]) [id number] Page 7 of 12 Education Assessment Education Provided To: Patient Education Topics Provided Wound/Skin Impairment: Handouts: Caring for Your Ulcer Methods: Explain/Verbal Responses: State content correctly Electronic Signature(s) Signed: 06/07/2023 4:42:29 PM By: Heather Heather Entered By: Heather Heather on 06/07/2023 13:53:13 -------------------------------------------------------------------------------- Wound Assessment Details Patient Name: Date of Service: [name], Janaysha Heather.  Heather 11:00 Heather Heather Heather: [id number] Patient Account [name]: [id number] Date of Birth/Sex: Treating RN: 12/14/1975 (47 y.o. Heather Heather Primary Care : Heather Heather Other Clinician: Referring : Treating /Extender: Heather Heather, Heather Heather Heather Heather, Heather Heather in Treatment: 64 Wound Status Wound [name]: 11 Primary Open Surgical Wound Etiology: Wound Location: Right, Plantar Foot Wound Open Wounding Event: Surgical Injury Status: Date Acquired: 12/01/2021 Comorbid Chronic sinus problems/congestion, Middle ear problems, Anemia, [name] Of Treatment: 64 History: Chronic Obstructive Pulmonary Disease (COPD), Congestive Heart Clustered Wound: No Failure, Type II Diabetes, End Stage Renal Disease, History of pressure wounds, Neuropathy Photos Wound Measurements Length: (cm) 0.1 Width: (cm) 0.1 Depth: (cm) 0.1 Area: (cm) 0.008 Volume: (cm) 0.001 % Reduction in Area: 99.6% % Reduction in Volume: 99.9% Epithelialization: Medium (34-66%) Tunneling: No Undermining: No Wound Description Classification: Full Thickness Without Exposed Support Structures Wound Margin: Distinct, outline attached Heather Heather, Heather Heather ([id number]) Exudate Amount: None Present Foul Odor After Cleansing: No Slough/Fibrino No [id number] Page 8 of 12 Wound Bed Granulation Amount: None Present (0%) Exposed Structure Necrotic Amount: None Present (0%) Fascia Exposed: No Fat Layer (Subcutaneous Tissue) Exposed: No Tendon Exposed: No Muscle Exposed: No Joint Exposed: No Bone Exposed: No Treatment Notes Wound #11 (Foot) Wound Laterality: Plantar, Right Cleanser Soap and Water Discharge Instruction: Gently cleanse wound with antibacterial soap, rinse and pat dry prior to dressing wounds Wound Cleanser Discharge Instruction: Wash your hands with soap and water. Remove old dressing, discard into plastic bag and place into trash. Cleanse the wound with Wound Cleanser prior to applying Heather clean dressing using gauze sponges, not tissues or cotton balls. Do not scrub or use excessive force. Pat dry  using gauze sponges, not tissue or cotton balls. Heather-Wound Care Topical Primary Dressing Aquacel Extra Hydrofiber Dressing, 2x2 (in/in) Secondary Dressing ABD Pad 5x9 (in/in) Discharge Instruction: Cover with ABD pad Secured With Medipore T - 44M Medipore H Soft Cloth Surgical T ape ape, 2x2 (in/yd) Kerlix Roll Sterile or Non-Sterile 6-ply 4.5x4 (yd/yd) Discharge Instruction: Apply Kerlix as directed Compression Wrap Compression Stockings Add-Ons Electronic Signature(s) Signed: 06/07/2023 4:42:29 PM By: Heather Heather Entered By: Heather Heather on 06/07/2023 11:41:28 -------------------------------------------------------------------------------- Wound Assessment Details Patient Name: Date of Service: Heather Heather, Heather Heather Heather 11:00 Heather Heather Heather: [id number] Patient Account [name]: [id number] Date of Birth/Sex: Treating RN: 21-Sep-1976 (47 y.o. Heather Heather Primary Care : Heather Heather Other Clinician: Referring : Treating /Extender: Heather Heather, Heather Heather Heather Heather, Heather Heather in Treatment: 64 Wound Status Wound [name]: 12 Primary Diabetic Wound/Ulcer of the Lower Extremity Etiology: Wound Location: Left, Medial, Plantar Foot Wound Open Wounding Event: Pressure Injury LARIMAR, PEPITONE Heather ([id number]) [id number] Page 9 of 12 Wounding Event: Pressure Injury Status: Date Acquired: 03/16/2020 Comorbid Chronic sinus problems/congestion, Middle ear problems, Anemia, [name] Of Treatment: 64 History: Chronic Obstructive Pulmonary Disease (COPD), Congestive Heart Clustered Wound: No Failure, Type II Diabetes, End Stage Renal Disease, History of pressure wounds, Neuropathy Photos Wound Measurements Length: (cm) 0.1 Width: (cm) 0.1 Depth: (cm) 0.1 Area: (cm) 0.008 Volume: (cm) 0.001 % Reduction in Area: 99.8% % Reduction in Volume: 99.9% Epithelialization: None Tunneling: No Undermining: No Wound  Description Classification: Grade 3 Wound Margin: Flat and Intact Exudate Amount: None Present Foul Odor After Cleansing: No Slough/Fibrino No Wound Bed Granulation Amount: None Present (0%) Exposed Structure Necrotic Amount: None Present (0%) Fascia Exposed: No Fat Layer (Subcutaneous Tissue) Exposed: No Tendon Exposed: No Muscle Exposed: No Joint Exposed: No  Bone Exposed: No Treatment Notes Wound #12 (Foot) Wound Laterality: Plantar, Left, Medial Cleanser Soap and Water Discharge Instruction: Gently cleanse wound with antibacterial soap, rinse and pat dry prior to dressing wounds Wound Cleanser Discharge Instruction: Wash your hands with soap and water. Remove old dressing, discard into plastic bag and place into trash. Cleanse the wound with Wound Cleanser prior to applying Heather clean dressing using gauze sponges, not tissues or cotton balls. Do not scrub or use excessive force. Pat dry using gauze sponges, not tissue or cotton balls. Heather-Wound Care Topical Gentamicin Discharge Instruction: Apply as directed by . Mupirocin Ointment Discharge Instruction: Apply as directed by . Primary Dressing Hydrofera Blue Ready Transfer Foam, 2.5x2.5 (in/in) Discharge Instruction: Apply Hydrofera Blue Ready to wound bed as directed Secondary Dressing Gauze Discharge Instruction: As directed: dry heel cup Secured With Medipore T - 32M Medipore H Soft Cloth Surgical T ape ape, 2x2 (in/yd) Kerlix Roll Sterile or Non-Sterile 6-ply 4.5x4 (yd/yd) Discharge Instruction: Apply Kerlix as directed AANIYA, MACZKA Heather ([id number]) [id number] Page 10 of 12 Compression Wrap Compression Stockings Add-Ons Electronic Signature(s) Signed: 06/07/2023 4:42:29 PM By: Heather Heather Entered By: Heather Heather on 06/07/2023 11:43:11 -------------------------------------------------------------------------------- Wound Assessment Details Patient Name: Date of  Service: Heather Heather, Heather Heather Heather 11:00 Heather Heather Heather: [id number] Patient Account [name]: [id number] Date of Birth/Sex: Treating RN: 06-Oct-1976 (47 y.o. Heather Heather Primary Care : Heather Heather Other Clinician: Referring : Treating /Extender: Heather Heather, Heather Heather Heather Heather, Heather Heather in Treatment: 64 Wound Status Wound [name]: 14 Primary Trauma, Other Etiology: Wound Location: Right, Anterior Lower Leg Secondary Diabetic Wound/Ulcer of the Lower Extremity Wounding Event: Bite Etiology: Date Acquired: 05/14/2023 Wound Open [name] Of Treatment: 3 Status: Clustered Wound: No Comorbid Chronic sinus problems/congestion, Middle ear problems, Anemia, History: Chronic Obstructive Pulmonary Disease (COPD), Congestive Heart Failure, Type II Diabetes, End Stage Renal Disease, History of pressure wounds, Neuropathy Photos Wound Measurements Length: (cm) 1.1 Width: (cm) 0.5 Depth: (cm) 0.1 Area: (cm) 0.432 Volume: (cm) 0.043 % Reduction in Area: 71% % Reduction in Volume: 85.6% Epithelialization: None Tunneling: No Undermining: No Wound Description Classification: Full Thickness Without Exposed Suppo Wound Margin: Distinct, outline attached Exudate Amount: Small Exudate Type: Serosanguineous Exudate Color: red, brown rt Structures Foul Odor After Cleansing: No Slough/Fibrino Yes Wound Bed Heather Heather, Heather Heather ([id number]) [id number] Page 11 of 12 Granulation Amount: Large (67-100%) Exposed Structure Granulation Quality: Red Fascia Exposed: No Necrotic Amount: None Present (0%) Fat Layer (Subcutaneous Tissue) Exposed: Yes Tendon Exposed: No Muscle Exposed: No Joint Exposed: No Bone Exposed: No Treatment Notes Wound #14 (Lower Leg) Wound Laterality: Right, Anterior Cleanser Normal Saline Discharge Instruction: Wash your hands with soap and water. Remove old dressing, discard into plastic bag and place into  trash. Cleanse the wound with Normal Saline prior to applying Heather clean dressing using gauze sponges, not tissues or cotton balls. Do not scrub or use excessive force. Pat dry using gauze sponges, not tissue or cotton balls. Heather-Wound Care Topical Mupirocin Ointment Discharge Instruction: Apply as directed by . Primary Dressing Secondary Dressing (BORDER) Zetuvit Plus SILICONE BORDER Dressing 4x4 (in/in) Discharge Instruction: Please do not put silicone bordered dressings under wraps. Use non-bordered dressing only. Secured With Compression Wrap Compression Stockings Add-Ons Electronic Signature(s) Signed: 06/07/2023 4:42:29 PM By: Heather Heather Entered By: Heather Heather on 06/07/2023 11:46:47 -------------------------------------------------------------------------------- Vitals Details Patient Name: Date of Service: Heather Heather, Heather Heather. Heather 11:00 Heather Heather Heather: [id number] Patient Account [name]: [id number] Date  of Birth/Sex: Treating RN: 01-Dec-1975 (47 y.o. Heather Heather Primary Care : Heather Heather Other Clinician: Referring : Treating /Extender: Heather Heather, Heather Heather Heather Heather, Heather Heather in Treatment: 64 Vital Signs Time Taken: 11:21 Temperature (F): 98 Height (in): 69 Pulse (bpm): 58 Weight (lbs): 178 Respiratory Rate (breaths/min): 18 Body Mass Index (BMI): 26.3 Blood Pressure (mmHg): 129/80 Reference Range: 80 - 120 mg / dl Electronic Signature(s) Signed: 06/07/2023 4:42:29 PM By: Heather Heather Entered By: Heather Heather on 06/07/2023 11:22:16 Heather Heather ([id number]) [id number] Page 12 of 12

## 2023-06-14 ENCOUNTER — Encounter (HOSPITAL_BASED_OUTPATIENT_CLINIC_OR_DEPARTMENT_OTHER): Payer: MEDICAID | Admitting: Internal Medicine

## 2023-06-14 DIAGNOSIS — L97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 of skin: Secondary | ICD-10-CM | POA: Diagnosis not present

## 2023-06-14 DIAGNOSIS — L97512 Non-pressure chronic ulcer of other part of right foot with fat layer exposed: Secondary | ICD-10-CM

## 2023-06-14 DIAGNOSIS — E11621 Type 2 diabetes mellitus with foot ulcer: Secondary | ICD-10-CM

## 2023-06-14 DIAGNOSIS — L97528 Non-pressure chronic ulcer of other part of left foot with other specified severity: Secondary | ICD-10-CM

## 2023-06-14 DIAGNOSIS — E11622 Type 2 diabetes mellitus with other skin ulcer: Secondary | ICD-10-CM | POA: Diagnosis not present

## 2023-06-21 ENCOUNTER — Encounter (HOSPITAL_BASED_OUTPATIENT_CLINIC_OR_DEPARTMENT_OTHER): Payer: MEDICAID | Admitting: Internal Medicine

## 2023-06-21 DIAGNOSIS — L97512 Non-pressure chronic ulcer of other part of right foot with fat layer exposed: Secondary | ICD-10-CM

## 2023-06-21 DIAGNOSIS — E11621 Type 2 diabetes mellitus with foot ulcer: Secondary | ICD-10-CM

## 2023-06-21 DIAGNOSIS — L97528 Non-pressure chronic ulcer of other part of left foot with other specified severity: Secondary | ICD-10-CM

## 2023-06-21 DIAGNOSIS — L97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 of skin: Secondary | ICD-10-CM

## 2023-06-21 DIAGNOSIS — E11622 Type 2 diabetes mellitus with other skin ulcer: Secondary | ICD-10-CM | POA: Diagnosis not present

## 2023-06-23 NOTE — Progress Notes (Signed)
Heather, [name] ([id number]) [id number] Page 1 of 11 Visit Report for 06/14/2023 Arrival Information Details Patient Name: Date of Service: [name], [name] 06/14/2023 11:00 [name]: [id number] Patient Account Number: [id number] Date of Birth/Sex: Treating RN: 04-06-1976 (47 y.o. [name] Primary Care [name]: [name] Other Clinician: Betha [name] Referring [name]: Treating [name] in Treatment: 65 Visit Information History Since Last Visit All ordered tests and consults were completed: No Patient Arrived: Wheel Chair Added or deleted any medications: No Arrival Time: 11:24 Any new allergies or adverse reactions: No Transfer Assistance: EasyPivot Patient Lift Had [name] fall or experienced change in No Patient Identification Verified: Yes activities of daily living that may affect Secondary Verification Process Completed: Yes risk of falls: Patient Requires Transmission-Based Precautions: No Signs or symptoms of abuse/neglect since last visito No Patient Has Alerts: No Hospitalized since last visit: No Implantable device outside of the clinic excluding No cellular tissue based products placed in the center since last visit: Has Dressing in Place as Prescribed: Yes Has Footwear/Offloading in Place as Prescribed: Yes Left: T Contact Cast otal Right: Wedge Shoe Pain Present Now: No Electronic Signature(s) Signed: 06/14/2023 5:02:30 PM By: [name] Entered By: [name] on 06/14/2023 08:24:53 -------------------------------------------------------------------------------- Clinic Level of Care Assessment Details Patient Name: Date of Service: [name], [name] 06/14/2023 11:00 [name]: [id number] Patient Account Number: [id number] Date of Birth/Sex: Treating RN: [birth year] (47 y.o. [name] Primary Care [name]: [name] Other  Clinician: Betha [name] Referring [name]: Treating [name]/Extender: [name] in Treatment: 19 Clinic Level of Care Assessment Items TOOL 4 Quantity Score []  - 0 Use when only an EandM is performed on FOLLOW-UP visit ASSESSMENTS - Nursing Assessment / Reassessment [name], [name] ([id number]) [id number] Page 2 of 11 X- 1 10 Reassessment of Co-morbidities (includes updates in patient status) X- 1 5 Reassessment of Adherence to Treatment Plan ASSESSMENTS - Wound and Skin [name] ssessment / Reassessment []  - 0 Simple Wound Assessment / Reassessment - one wound X- 3 5 Complex Wound Assessment / Reassessment - multiple wounds []  - 0 Dermatologic / Skin Assessment (not related to wound area) ASSESSMENTS - Focused Assessment []  - 0 Circumferential Edema Measurements - multi extremities []  - 0 Nutritional Assessment / Counseling / Intervention []  - 0 Lower Extremity Assessment (monofilament, tuning fork, pulses) []  - 0 Peripheral Arterial Disease Assessment (using hand held doppler) ASSESSMENTS - Ostomy and/or Continence Assessment and Care []  - 0 Incontinence Assessment and Management []  - 0 Ostomy Care Assessment and Management (repouching, etc.) PROCESS - Coordination of Care X - Simple Patient / Family Education for ongoing care 1 15 []  - 0 Complex (extensive) Patient / Family Education for ongoing care []  - 0 Staff obtains Chiropractor, Records, T Results / Process Orders est []  - 0 Staff telephones HHA, Nursing Homes / Clarify orders / etc []  - 0 Routine Transfer to another Facility (non-emergent condition) []  - 0 Routine Hospital Admission (non-emergent condition) []  - 0 New Admissions / Manufacturing engineer / Ordering NPWT Apligraf, etc. , []  - 0 Emergency Hospital Admission (emergent condition) X- 1 10 Simple Discharge Coordination []  - 0 Complex (extensive) Discharge Coordination PROCESS - Special  Needs []  - 0 Pediatric / Minor Patient Management []  - 0 Isolation Patient Management []  - 0 Hearing / Language / Visual special needs []  - 0 Assessment of Community assistance (transportation, D/C planning, etc.) []  - 0  Additional assistance / Altered mentation []  - 0 Support Surface(s) Assessment (bed, cushion, seat, etc.) INTERVENTIONS - Wound Cleansing / Measurement []  - 0 Simple Wound Cleansing - one wound X- 3 5 Complex Wound Cleansing - multiple wounds X- 1 5 Wound Imaging (photographs - any number of wounds) []  - 0 Wound Tracing (instead of photographs) []  - 0 Simple Wound Measurement - one wound []  - 0 Complex Wound Measurement - multiple wounds INTERVENTIONS - Wound Dressings X - Small Wound Dressing one or multiple wounds 3 10 []  - 0 Medium Wound Dressing one or multiple wounds []  - 0 Large Wound Dressing one or multiple wounds []  - 0 Application of Medications - topical []  - 0 Application of Medications - injection [name], [name] ([id number]) [id number] Page 3 of 11 INTERVENTIONS - Miscellaneous []  - 0 External ear exam []  - 0 Specimen Collection (cultures, biopsies, blood, body fluids, etc.) []  - 0 Specimen(s) / Culture(s) sent or taken to Lab for analysis []  - 0 Patient Transfer (multiple staff / Michiel Sites Lift / Similar devices) []  - 0 Simple Staple / Suture removal (25 or less) []  - 0 Complex Staple / Suture removal (26 or more) []  - 0 Hypo / Hyperglycemic Management (close monitor of Blood Glucose) []  - 0 Ankle / Brachial Index (ABI) - do not check if billed separately X- 1 5 Vital Signs Has the patient been seen at the hospital within the last three years: Yes Total Score: 110 Level Of Care: New/Established - Level 3 Electronic Signature(s) Signed: 06/14/2023 5:02:30 PM By: [name] Entered By: [name] on 06/14/2023  08:51:57 -------------------------------------------------------------------------------- Encounter Discharge Information Details Patient Name: Date of Service: Heather Jefferson [name]. 06/14/2023 11:00 [name]: [id number] Patient Account Number: [id number] Date of Birth/Sex: Treating RN: 1976/09/18 (47 y.o. [name] Primary Care Shaylen Nephew: [name] Other Clinician: Betha [name] Referring Milledge Gerding: Treating Mikylah Ackroyd/Extender: [name] in Treatment: 40 Encounter Discharge Information Items Discharge Condition: Stable Ambulatory Status: Wheelchair Discharge Destination: Home Transportation: Private Auto Accompanied By: mother Schedule Follow-up Appointment: Yes Clinical Summary of Care: Electronic Signature(s) Signed: 06/14/2023 5:02:30 PM By: [name] Entered By: [name] on 06/14/2023 09:56:56 [name] ([id number]) [id number] Page 4 of 11 -------------------------------------------------------------------------------- Lower Extremity Assessment Details Patient Name: Date of Service: [name], [name] 06/14/2023 11:00 [name]: [id number] Patient Account Number: [id number] Date of Birth/Sex: Treating RN: [birth year] (47 y.o. [name] Primary Care Markeem Noreen: [name] Other Clinician: Betha [name] Referring Kyo Cocuzza: Treating Ernie Kasler/Extender: [name] Weeks in Treatment: 65 Electronic Signature(s) Signed: 06/14/2023 5:02:30 PM By: [name] Signed: 06/23/2023 11:59:03 AM By: Yevonne Pax RN Entered By: [name] on 06/14/2023 08:39:18 -------------------------------------------------------------------------------- Multi Wound Chart Details Patient Name: Date of Service: Heather Jefferson [name]. 06/14/2023 11:00 [name]: [id number] Patient Account Number: [id number] Date of Birth/Sex: Treating RN: [birth year]  (47 y.o. [name] Primary Care Calayah Guadarrama: [name] Other Clinician: Betha [name] Referring Quina Wilbourne: Treating Jemuel Laursen/Extender: [name] in Treatment: 39 Vital Signs Height(in): 69 Pulse(bpm): 90 Weight(lbs): 178 Blood Pressure(mmHg): 144/91 Body Mass Index(BMI): 26.3 Temperature(F): 98.5 Respiratory Rate(breaths/min): 18 [11:Photos:] Right, Plantar Foot Left, Medial, Plantar Foot Right, Anterior Lower Leg Wound Location: Surgical Injury Pressure Injury Bite Wounding Event: Open Surgical Wound Diabetic Wound/Ulcer of the Lower Trauma, Other Primary Etiology: Extremity N/[name] N/[name] 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 Chronic sinus problems/congestion, Chronic sinus problems/congestion, Chronic sinus  problems/congestion, Comorbid History: Middle ear problems, Anemia, Chronic Middle ear problems, Anemia, Chronic Middle ear problems, Anemia, Chronic Obstructive Pulmonary Disease Obstructive Pulmonary Disease Obstructive Pulmonary Disease (COPD), Congestive Heart Failure, (COPD), Congestive Heart Failure, (COPD), Congestive Heart Failure, Type II Diabetes, End Stage Renal Type II Diabetes, End Stage Renal Type II Diabetes, End Stage Renal Disease, History of pressure wounds, Disease, History of pressure wounds, Disease, History of pressure wounds, Neuropathy Neuropathy Neuropathy 12/01/2021 03/16/2020 05/14/2023 Date Acquired: 65 65 4 Weeks of Treatment: Healed - Epithelialized Healed - Epithelialized Healed - Epithelialized Wound Status: No No No Wound Recurrence: 0x0x0 0x0x0 0x0x0 Measurements L x W x D (cm) 0 0 0 [name] (cm) : rea 0 0 0 Volume (cm) : 100.00% 100.00% 100.00% % Reduction in Area: 100.00% 100.00% 100.00% % Reduction in VolumeKORALYN, [name] ([id number]) [id number] Page 5 of 11 Full Thickness Without Exposed Grade 3 Full Thickness Without  Exposed Classification: Support Structures Support Structures None Present None Present None Present Exudate Amount: Distinct, outline attached Flat and Intact Distinct, outline attached Wound Margin: None Present (0%) None Present (0%) None Present (0%) Granulation Amount: None Present (0%) None Present (0%) None Present (0%) Necrotic Amount: Fascia: No Fascia: No Fat Layer (Subcutaneous Tissue): Yes Exposed Structures: Fat Layer (Subcutaneous Tissue): No Fat Layer (Subcutaneous Tissue): No Fascia: No Tendon: No Tendon: No Tendon: No Muscle: No Muscle: No Muscle: No Joint: No Joint: No Joint: No Bone: No Bone: No Bone: No Large (67-100%) Large (67-100%) Large (67-100%) Epithelialization: Treatment Notes Electronic Signature(s) Signed: 06/14/2023 5:02:30 PM By: [name] Entered By: [name] on 06/14/2023 08:49:22 -------------------------------------------------------------------------------- Multi-Disciplinary Care Plan Details Patient Name: Date of Service: Heather Jefferson [name]. 06/14/2023 11:00 [name]: [id number] Patient Account Number: [id number] Date of Birth/Sex: Treating RN: 07-Oct-1976 (47 y.o. [name] Primary Care Kerrilyn Azbill: [name] Other Clinician: Betha [name] Referring Martisha Toulouse: Treating Azara Gemme/Extender: [name] in Treatment: 20 Active Inactive Abuse / Safety / Falls / Self Care Management Nursing Diagnoses: History of Falls Potential for falls Potential for injury related to falls Goals: Patient will not develop complications from immobility Date Initiated: 03/17/2022 Date Inactivated: 04/20/2022 Target Resolution Date: 03/16/2022 Goal Status: Met Patient/caregiver will verbalize understanding of skin care regimen Date Initiated: 03/17/2022 Date Inactivated: 09/28/2022 Target Resolution Date: 03/16/2022 Goal Status: Met Patient/caregiver will verbalize/demonstrate measure  taken to improve self care Date Initiated: 03/17/2022 Target Resolution Date: 06/21/2023 Goal Status: Active Interventions: Assess fall risk on admission and as needed Provide education on basic hygiene Provide education on personal and home safety Notes: Electronic Signature(s) Signed: 06/14/2023 5:02:30 PM By: [name] Signed: 06/23/2023 11:59:03 AM By: Yevonne Pax RN [name], [name] ([id number]) [id number] Page 6 of 11 Entered By: [name] on 06/14/2023 08:52:15 -------------------------------------------------------------------------------- Pain Assessment Details Patient Name: Date of Service: [name], [name] 06/14/2023 11:00 [name]: [id number] Patient Account Number: [id number] Date of Birth/Sex: Treating RN: [birth year] (47 y.o. [name] Primary Care Aaira Oestreicher: [name] Other Clinician: Betha [name] Referring Lashawna Poche: Treating Nakeita Styles/Extender: [name] in Treatment: 59 Active Problems Location of Pain Severity and Description of Pain Patient Has Paino No Site Locations Pain Management and Medication Current Pain Management: Electronic Signature(s) Signed: 06/14/2023 5:02:30 PM By: [name] Signed: 06/23/2023 11:59:03 AM By: Yevonne Pax RN Entered By: [name] on 06/14/2023 08:37:31 -------------------------------------------------------------------------------- Patient/Caregiver Education Details Patient Name: Date of Service: [name], [name] 8/21/2024andnbsp11:00 [name]:  [id number] Patient Account Number: [id number] Date of Birth/Gender: Treating RN: 12-21-1975 (47 y.o. Kindsey, Chesson, Brissia [name] ([id number]) [id number] Page 7 of 11 Primary Care Physician: [name] Other Clinician: Betha [name] Referring Physician: Treating Physician/Extender: [name] in  Treatment: 29 Education Assessment Education Provided To: Patient Education Topics Provided Offloading: Handouts: Other: offload with felt pad, stay off feet as much as possible Methods: Explain/Verbal Responses: State content correctly Wound/Skin Impairment: Electronic Signature(s) Signed: 06/14/2023 5:02:30 PM By: [name] Entered By: [name] on 06/14/2023 09:56:13 -------------------------------------------------------------------------------- Wound Assessment Details Patient Name: Date of Service: Heather Jefferson [name]. 06/14/2023 11:00 [name]: [id number] Patient Account Number: [id number] Date of Birth/Sex: Treating RN: 02-19-1976 (47 y.o. [name] Primary Care Jeda Pardue: [name] Other Clinician: Betha [name] Referring Teghan Philbin: Treating Makailee Nudelman/Extender: [name] in Treatment: 99 Wound Status Wound Number: 11 Primary Open Surgical Wound Etiology: Wound Location: Right, Plantar Foot Wound Healed - Epithelialized Wounding Event: Surgical Injury Status: Date Acquired: 12/01/2021 Comorbid Chronic sinus problems/congestion, Middle ear problems, Anemia, Weeks Of Treatment: 65 History: Chronic Obstructive Pulmonary Disease (COPD), Congestive Heart Clustered Wound: No Failure, Type II Diabetes, End Stage Renal Disease, History of pressure wounds, Neuropathy Photos Wound Measurements Length: (cm) 0 Width: (cm) 0 Depth: (cm) 0 Area: (cm) 0 [name], [name] ([id number]) Volume: (cm) 0 % Reduction in Area: 100% % Reduction in Volume: 100% Epithelialization: Large (67-100%) [id number] Page 8 of 11 Wound Description Classification: Full Thickness Without Exposed Suppor Wound Margin: Distinct, outline attached Exudate Amount: None Present t Structures Foul Odor After Cleansing: No Slough/Fibrino No Wound Bed Granulation Amount: None Present (0%) Exposed Structure Necrotic  Amount: None Present (0%) Fascia Exposed: No Fat Layer (Subcutaneous Tissue) Exposed: No Tendon Exposed: No Muscle Exposed: No Joint Exposed: No Bone Exposed: No Treatment Notes Wound #11 (Foot) Wound Laterality: Plantar, Right Cleanser Heather-Wound Care Topical Primary Dressing Secondary Dressing Secured With Compression Wrap Compression Stockings Add-Ons Electronic Signature(s) Signed: 06/14/2023 5:02:30 PM By: [name] Signed: 06/23/2023 11:59:03 AM By: Yevonne Pax RN Entered By: [name] on 06/14/2023 08:46:38 -------------------------------------------------------------------------------- Wound Assessment Details Patient Name: Date of Service: Heather Jefferson [name]. 06/14/2023 11:00 [name]: [id number] Patient Account Number: [id number] Date of Birth/Sex: Treating RN: 07-18-1976 (47 y.o. [name] Primary Care Darlis Wragg: [name] Other Clinician: Betha [name] Referring Deiona Hooper: Treating Argie Lober/Extender: [name] in Treatment: 65 Wound Status Wound Number: 12 Primary Diabetic Wound/Ulcer of the Lower Extremity Etiology: Wound Location: Left, Medial, Plantar Foot Wound Healed - Epithelialized Wounding Event: Pressure Injury Status: Date Acquired: 03/16/2020 Comorbid Chronic sinus problems/congestion, Middle ear problems, Anemia, Weeks Of Treatment: 65 History: Chronic Obstructive Pulmonary Disease (COPD), Congestive Heart Clustered Wound: No Failure, Type II Diabetes, End Stage Renal Disease, History of pressure wounds, Neuropathy Photos [name], [name] ([id number]) [id number] Page 9 of 11 Wound Measurements Length: (cm) Width: (cm) Depth: (cm) Area: (cm) Volume: (cm) 0 % Reduction in Area: 100% 0 % Reduction in Volume: 100% 0 Epithelialization: Large (67-100%) 0 0 Wound Description Classification: Grade 3 Wound Margin: Flat and Intact Exudate Amount: None  Present Foul Odor After Cleansing: No Slough/Fibrino No Wound Bed Granulation Amount: None Present (0%) Exposed Structure Necrotic Amount: None Present (0%) Fascia Exposed: No Fat Layer (Subcutaneous Tissue) Exposed: No Tendon Exposed: No Muscle Exposed: No Joint Exposed: No Bone Exposed: No Treatment Notes Wound #12 (Foot) Wound Laterality: Plantar, Left, Medial Cleanser Heather-Wound Care Topical Primary  Dressing Secondary Dressing Secured With Compression Wrap Compression Stockings Add-Ons Electronic Signature(s) Signed: 06/14/2023 5:02:30 PM By: [name] Signed: 06/23/2023 11:59:03 AM By: Yevonne Pax RN Entered By: [name] on 06/14/2023 08:47:39 Wound Assessment Details -------------------------------------------------------------------------------- [name] ([id number]) [id number] Page 10 of 11 Patient Name: Date of Service: ATIANNA, CLODFELTER 06/14/2023 11:00 [name]: [id number] Patient Account Number: [id number] Date of Birth/Sex: Treating RN: [birth year] (47 y.o. [name] Primary Care Jaedah Lords: [name] Other Clinician: Betha [name] Referring Akeem Heppler: Treating Markan Cazarez/Extender: [name] in Treatment: 66 Wound Status Wound Number: 14 Primary Trauma, Other Etiology: Wound Location: Right, Anterior Lower Leg Secondary Diabetic Wound/Ulcer of the Lower Extremity Wounding Event: Bite Etiology: Date Acquired: 05/14/2023 Wound Healed - Epithelialized Weeks Of Treatment: 4 Status: Clustered Wound: No Comorbid Chronic sinus problems/congestion, Middle ear problems, Anemia, History: Chronic Obstructive Pulmonary Disease (COPD), Congestive Heart Failure, Type II Diabetes, End Stage Renal Disease, History of pressure wounds, Neuropathy Photos Wound Measurements Length: (cm) 0 % Reduction in Area: 100% Width: (cm) 0 % Reduction in Volume: 100% Depth: (cm)  0 Epithelialization: Large (67-100%) Area: (cm) 0 Volume: (cm) 0 Wound Description Classification: Full Thickness Without Exposed Support Structures Foul Odor After Cleansing: No Wound Margin: Distinct, outline attached Slough/Fibrino No Exudate Amount: None Present Wound Bed Granulation Amount: None Present (0%) Exposed Structure Necrotic Amount: None Present (0%) Fascia Exposed: No Fat Layer (Subcutaneous Tissue) Exposed: Yes Tendon Exposed: No Muscle Exposed: No Joint Exposed: No Bone Exposed: No Electronic Signature(s) Signed: 06/14/2023 5:02:30 PM By: [name] Signed: 06/23/2023 11:59:03 AM By: Yevonne Pax RN Entered By: [name] on 06/14/2023 08:48:40 Vitals Details -------------------------------------------------------------------------------- [name] ([id number]) [id number] Page 11 of 11 Patient Name: Date of Service: BREEAUNA, OLESON 06/14/2023 11:00 [name]: [id number] Patient Account Number: [id number] Date of Birth/Sex: Treating RN: [birth year] (47 y.o. [name] Primary Care Dianara Smullen: [name] Other Clinician: Betha [name] Referring Itzae Miralles: Treating Dula Havlik/Extender: [name] in Treatment: 82 Vital Signs Time Taken: 11:25 Temperature (F): 98.5 Height (in): 69 Pulse (bpm): 90 Weight (lbs): 178 Respiratory Rate (breaths/min): 18 Body Mass Index (BMI): 26.3 Blood Pressure (mmHg): 144/91 Reference Range: 80 - 120 mg / dl Electronic Signature(s) Signed: 06/14/2023 5:02:30 PM By: [name] Entered By: [name] on 06/14/2023 37:10:62

## 2023-06-23 NOTE — Progress Notes (Signed)
Heather, [name] [name] ([id number]) [id number] Page 1 of 7 Visit Report for 06/21/2023 Arrival Information Details Patient Name: Date of Service: [name], [name] 06/21/2023 12:15 PM Medical Record Number: [id number] Patient Account Number: [id number] Date of Birth/Sex: Treating RN: 12/09/1975 (47 y.o. [name] Primary Care Laban Orourke: [name] Other Clinician: [name] Referring Brendia Dampier: Treating Promyse Ardito/Extender: Chapman Moss in Treatment: 64 Visit Information History Since Last Visit All ordered tests and consults were completed: No Patient Arrived: Ambulatory Added or deleted any medications: No Arrival Time: 12:23 Any new allergies or adverse reactions: No Transfer Assistance: None Had [name] fall or experienced change in No Patient Identification Verified: Yes activities of daily living that may affect Secondary Verification Process Completed: Yes risk of falls: Patient Requires Transmission-Based Precautions: No Signs or symptoms of abuse/neglect since No Patient Has Alerts: No last visito Hospitalized since last visit: No Implantable device outside of the clinic No excluding cellular tissue based products placed in the center since last visit: Has Dressing in Place as Prescribed: Yes Has Footwear/Offloading in Place as Yes Prescribed: Left: Surgical Shoe with Pressure Relief Insole Right: Surgical Shoe with Pressure Relief Insole Pain Present Now: No Electronic Signature(s) Signed: 06/21/2023 4:41:34 PM By: [name] Entered By: [name] on 06/21/2023 09:25:57 -------------------------------------------------------------------------------- Clinic Level of Care Assessment Details Patient Name: Date of Service: Heather, [name] 06/21/2023 12:15 PM Medical Record Number: [id number] Patient Account Number: [id number] Date of Birth/Sex: Treating RN: January 13, 1976 (47 y.o. [name] Primary Care  Thorsten Climer: [name] Other Clinician: [name] Referring Sehaj Kolden: Treating Dyrell Tuccillo/Extender: Chapman Moss in Treatment: 33 Clinic Level of Care Assessment Items Heather, VICENS [name] ([id number]) [id number] Page 2 of 7 TOOL 4 Quantity Score []  - 0 Use when only an EandM is performed on FOLLOW-UP visit ASSESSMENTS - Nursing Assessment / Reassessment X- 1 10 Reassessment of Co-morbidities (includes updates in patient status) X- 1 5 Reassessment of Adherence to Treatment Plan ASSESSMENTS - Wound and Skin [name] ssessment / Reassessment []  - 0 Simple Wound Assessment / Reassessment - one wound []  - 0 Complex Wound Assessment / Reassessment - multiple wounds []  - 0 Dermatologic / Skin Assessment (not related to wound area) ASSESSMENTS - Focused Assessment []  - 0 Circumferential Edema Measurements - multi extremities []  - 0 Nutritional Assessment / Counseling / Intervention []  - 0 Lower Extremity Assessment (monofilament, tuning fork, pulses) []  - 0 Peripheral Arterial Disease Assessment (using hand held doppler) ASSESSMENTS - Ostomy and/or Continence Assessment and Care []  - 0 Incontinence Assessment and Management []  - 0 Ostomy Care Assessment and Management (repouching, etc.) PROCESS - Coordination of Care X - Simple Patient / Family Education for ongoing care 1 15 []  - 0 Complex (extensive) Patient / Family Education for ongoing care []  - 0 Staff obtains Chiropractor, Records, T Results / Process Orders est []  - 0 Staff telephones HHA, Nursing Homes / Clarify orders / etc []  - 0 Routine Transfer to another Facility (non-emergent condition) []  - 0 Routine Hospital Admission (non-emergent condition) []  - 0 New Admissions / Manufacturing engineer / Ordering NPWT Apligraf, etc. , []  - 0 Emergency Hospital Admission (emergent condition) X- 1 10 Simple Discharge Coordination []  - 0 Complex (extensive) Discharge  Coordination PROCESS - Special Needs []  - 0 Pediatric / Minor Patient Management []  - 0 Isolation Patient Management []  - 0 Hearing / Language / Visual special needs []  - 0 Assessment of Community assistance (transportation, D/C planning, etc.) []  -  0 Additional assistance / Altered mentation []  - 0 Support Surface(s) Assessment (bed, cushion, seat, etc.) INTERVENTIONS - Wound Cleansing / Measurement []  - 0 Simple Wound Cleansing - one wound []  - 0 Complex Wound Cleansing - multiple wounds []  - 0 Wound Imaging (photographs - any number of wounds) []  - 0 Wound Tracing (instead of photographs) []  - 0 Simple Wound Measurement - one wound []  - 0 Complex Wound Measurement - multiple wounds INTERVENTIONS - Wound Dressings X - Small Wound Dressing one or multiple wounds 1 10 []  - 0 Medium Wound Dressing one or multiple wounds []  - 0 Large Wound Dressing one or multiple wounds [name], SIMMERING [name] ([id number]) [id number] Page 3 of 7 []  - 0 Application of Medications - topical []  - 0 Application of Medications - injection INTERVENTIONS - Miscellaneous []  - 0 External ear exam []  - 0 Specimen Collection (cultures, biopsies, blood, body fluids, etc.) []  - 0 Specimen(s) / Culture(s) sent or taken to Lab for analysis []  - 0 Patient Transfer (multiple staff / Michiel Sites Lift / Similar devices) []  - 0 Simple Staple / Suture removal (25 or less) []  - 0 Complex Staple / Suture removal (26 or more) []  - 0 Hypo / Hyperglycemic Management (close monitor of Blood Glucose) []  - 0 Ankle / Brachial Index (ABI) - do not check if billed separately X- 1 5 Vital Signs Has the patient been seen at the hospital within the last three years: Yes Total Score: 55 Level Of Care: New/Established - Level 2 Electronic Signature(s) Signed: 06/21/2023 4:41:34 PM By: [name] Entered By: [name] on 06/21/2023  09:46:31 -------------------------------------------------------------------------------- Encounter Discharge Information Details Patient Name: Date of Service: [name] 06/21/2023 12:15 PM Medical Record Number: [id number] Patient Account Number: [id number] Date of Birth/Sex: Treating RN: 11-15-1975 (47 y.o. [name] Primary Care Mardel Grudzien: [name] Other Clinician: [name] Referring Elisabeth Strom: Treating Kaelah Hayashi/Extender: Chapman Moss in Treatment: 75 Encounter Discharge Information Items Discharge Condition: Stable Ambulatory Status: Ambulatory Discharge Destination: Home Transportation: Private Auto Accompanied By: self Schedule Follow-up Appointment: Yes Clinical Summary of Care: Electronic Signature(s) Signed: 06/21/2023 4:41:34 PM By: [name] Entered By: [name] on 06/21/2023 09:57:26 [name] ([id number]) [id number] Page 4 of 7 -------------------------------------------------------------------------------- Lower Extremity Assessment Details Patient Name: Date of Service: DEVINE, BOBROWSKI 06/21/2023 12:15 PM Medical Record Number: [id number] Patient Account Number: [id number] Date of Birth/Sex: Treating RN: October 08, 1976 (47 y.o. [name] Primary Care Audris Speaker: [name] Other Clinician: [name] Referring Khush Pasion: Treating Pranavi Aure/Extender: [name] Weeks in Treatment: 34 Electronic Signature(s) Signed: 06/21/2023 4:41:34 PM By: [name] Signed: 06/23/2023 11:58:03 AM By: Yevonne Pax RN Entered By: [name] on 06/21/2023 09:31:01 -------------------------------------------------------------------------------- Multi Wound Chart Details Patient Name: Date of Service: [name] 06/21/2023 12:15 PM Medical Record Number: [id number] Patient Account Number: [id number] Date of Birth/Sex: Treating RN: 05-23-1976 (47  y.o. [name] Primary Care Marasia Newhall: [name] Other Clinician: [name] Referring Keatin Benham: Treating Nicholson Starace/Extender: Chapman Moss in Treatment: 65 Vital Signs Height(in): 69 Pulse(bpm): 52 Weight(lbs): 178 Blood Pressure(mmHg): 117/82 Body Mass Index(BMI): 26.3 Temperature(F): 98.4 Respiratory Rate(breaths/min): 16 [Treatment Notes:Wound Assessments Treatment Notes] Electronic Signature(s) Signed: 06/21/2023 4:41:34 PM By: [name] Entered By: [name] on 06/21/2023 09:31:08 [name] ([id number]) [id number] Page 5 of 7 -------------------------------------------------------------------------------- Multi-Disciplinary Care Plan Details Patient Name: Date of Service: CHEVELLA, RUBI 06/21/2023 12:15 PM Medical Record Number: [id number] Patient Account Number:  [id number] Date of Birth/Sex: Treating RN: 07/20/1976 (47 y.o. [name] Primary Care Sherod Cisse: [name] Other Clinician: [name] Referring Chrystel Barefield: Treating Osa Campoli/Extender: Chapman Moss in Treatment: 38 Active Inactive Abuse / Safety / Falls / Self Care Management Nursing Diagnoses: History of Falls Potential for falls Potential for injury related to falls Goals: Patient will not develop complications from immobility Date Initiated: 03/17/2022 Date Inactivated: 04/20/2022 Target Resolution Date: 03/16/2022 Goal Status: Met Patient/caregiver will verbalize understanding of skin care regimen Date Initiated: 03/17/2022 Date Inactivated: 09/28/2022 Target Resolution Date: 03/16/2022 Goal Status: Met Patient/caregiver will verbalize/demonstrate measure taken to improve self care Date Initiated: 03/17/2022 Target Resolution Date: 06/21/2023 Goal Status: Active Interventions: Assess fall risk on admission and as needed Provide education on basic hygiene Provide education on personal  and home safety Notes: Electronic Signature(s) Signed: 06/21/2023 4:41:34 PM By: [name] Signed: 06/23/2023 11:58:03 AM By: Yevonne Pax RN Entered By: [name] on 06/21/2023 09:55:18 -------------------------------------------------------------------------------- Pain Assessment Details Patient Name: Date of Service: [name] 06/21/2023 12:15 PM Medical Record Number: [id number] Patient Account Number: [id number] Date of Birth/Sex: Treating RN: 08-20-1976 (47 y.o. [name] Primary Care Francena Zender: [name] Other Clinician: [name] Referring Louay Myrie: Treating Irean Kendricks/Extender: Karey, Schilke [name] ([id number]) [id number] Page 6 of 7 Weeks in Treatment: 66 Active Problems Location of Pain Severity and Description of Pain Patient Has Paino No Site Locations Pain Management and Medication Current Pain Management: Electronic Signature(s) Signed: 06/21/2023 4:41:34 PM By: [name] Signed: 06/23/2023 11:58:03 AM By: Yevonne Pax RN Entered By: [name] on 06/21/2023 09:28:04 -------------------------------------------------------------------------------- Patient/Caregiver Education Details Patient Name: Date of Service: [name] 8/28/2024andnbsp12:15 PM Medical Record Number: [id number] Patient Account Number: [id number] Date of Birth/Gender: Treating RN: 12-Aug-1976 (47 y.o. [name] Primary Care Physician: [name] Other Clinician: [name] Referring Physician: Treating Physician/Extender: Chapman Moss in Treatment: 98 Education Assessment Education Provided To: Patient Education Topics Provided Offloading: Handouts: How Offloading Helps Foot Wounds Heal Methods: Explain/Verbal Responses: State content correctly Electronic Signature(s) Signed: 06/21/2023 4:41:34 PM By: [name] Entered By: [name] on  06/21/2023 09:55:49 [name] ([id number]) [id number] Page 7 of 7 -------------------------------------------------------------------------------- Vitals Details Patient Name: Date of Service: MADDALYN, NASEEM 06/21/2023 12:15 PM Medical Record Number: [id number] Patient Account Number: [id number] Date of Birth/Sex: Treating RN: 1976/02/16 (47 y.o. [name] Primary Care Shelbylynn Walczyk: [name] Other Clinician: [name] Referring Myeasha Ballowe: Treating Daisy Mcneel/Extender: Chapman Moss in Treatment: 32 Vital Signs Time Taken: 12:26 Temperature (F): 98.4 Height (in): 69 Pulse (bpm): 52 Weight (lbs): 178 Respiratory Rate (breaths/min): 16 Body Mass Index (BMI): 26.3 Blood Pressure (mmHg): 117/82 Reference Range: 80 - 120 mg / dl Electronic Signature(s) Signed: 06/21/2023 4:41:34 PM By: [name] Entered By: [name] on 06/21/2023 09:27:57

## 2023-06-23 NOTE — Progress Notes (Signed)
BIANNEY, MCCOMBS [name] ([id number]) [id number] Page 1 of 20 Visit Report for 06/21/2023 Chief Complaint Document Details Patient Name: Date of Service: [name], [name] 06/21/2023 12:15 PM Medical Record Number: [id number] Patient Account Number: [id number] Date of Birth/Sex: Treating RN: July 11, 1976 (47 y.o. [name] Primary Care Provider: [name] Other Clinician: [name] Referring Provider: Treating Provider/Extender: Chapman Moss in Treatment: 78 Information Obtained from: Patient Chief Complaint 03/19/2021; patient referred by Dr. Excell Seltzer who has been looking after her left foot for quite [name] period of time for review of [name] nonhealing area in the left midfoot 03/12/2022; bilateral feet wounds and right lower extremity wound. Electronic Signature(s) Signed: 06/21/2023 2:40:32 PM By: Geralyn Corwin DO Entered By: Geralyn Corwin on 06/21/2023 09:47:48 -------------------------------------------------------------------------------- HPI Details Patient Name: Date of Service: Heather Jefferson [name]. 06/21/2023 12:15 PM Medical Record Number: [id number] Patient Account Number: [id number] Date of Birth/Sex: Treating RN: 1975-12-17 (47 y.o. [name] Primary Care Provider: [name] Other Clinician: [name] Referring Provider: Treating Provider/Extender: Chapman Moss in Treatment: 41 History of Present Illness HPI Description: 01/18/18-She is here for initial evaluation of the left great toe ulcer. She is [name] poor historian in regards to timeframe in detail. She states approximately 4 weeks ago she lacerated her toe on something in the house. She followed up with her primary care who placed her on Bactrim and ultimately [name] second dose of Bactrim prior to coming to wound clinic. She states she has been treating the toe with peroxide, Betadine and [name] Band-Aid. She did not check her blood sugar  this morning but checked it yesterday morning it was 327; she is unaware of [name] recent A1c and there are no current records. She saw Dr. she would've orthopedics last week for an old injury to the left ankle, she states he did not see her toe, nor did she bring it to his attention. She smokes approximately 1 pack cigarettes [name] day. Her social situation is concerning, she arrives this morning with her mother who appears extremely intoxicated/under the influence; her mother was asked to leave the room and be monitored by the patient's grandmother. The patient's aunt then accompanied the patient and the room throughout the rest of the appointment. We had [name] lengthy discussion regarding the deleterious effects of uncontrolled hyperglycemia and smoking as it relates to wound healing and overall health. She was strongly encouraged to decrease her smoking and get her diabetes under better control. She states she is currently on [name] diet and has cut down her [facility] consumption. The left toe is erythematous, macerated and slightly edematous with malodor present. The edema in her left foot is below her baseline, there is no erythema streaking. We will treat her with Santyl, doxycycline; we have ordered and xray, culture and provided [name] Peg assist surgical shoe and cultured the wound. 01/25/18-She is here in follow-up evaluation for [name] left great toe ulcer and presents with an abscess to her suprapubic area. She states her blood sugars remain elevated, feeling "sick" and if levels are below 250, but she is trying. She has made no attempt to decrease her smoking stating that we "can't take away her food in her cigarettes". She has been compliant with offloading using the PEG assist you. She is using Santyl daily. the culture obtained last week grew staph aureus and Enterococcus faecalis; continues on the doxycycline and Augmentin was added on Monday. The suprapubic area has erythema, no femoral Hennick, [place]  [name]  ([id number]) [id number] Page 2 of 20 variation, purple discoloration, minimal induration, was accessed with [name] cotton tip applicator with sanguinopurulent drainage, this was cultured, I suspect the current antibiotic treatment will cover and we will not add anything to her current treatment plan. She was advised to go to urgent care or ER with any change in redness, induration or fever. 02/01/18-She is here in follow-up evaluation for left great toe ulcers and [name] new abdominal abscess from last week. She was able to use packing until earlier this week, where she "forgot it was there". She states she was feeling ill with GI symptoms last week and was not taking her antibiotic. She states her glucose levels have been predominantly less than 200, with occasional levels between 200-250. She thinks this was contributing to her GI symptoms as they have resolved without intervention. There continues to be significant laceration to left toe, otherwise it clinically looks stable/improved. There is now less superficial opening to the lateral aspect of the great toe that was residual blister. We will transition to [facility] to all wounds, she will continue her Augmentin. If there is no change or deterioration next week for reculture. 02/08/18-She is here in follow-up evaluation for left great toe ulcer and abdominal ulcer. There is an improvement in both wounds. She has been wrapping her left toe with coban, not by our direction, which has created an area of discoloration to the medial aspect; she has been advised to NOT use coban secondary to her neuropathy. She states her glucose levels have been high over this last week ranging from 200-350, she continues to smoke. She admits to being less compliant with her offloading shoe. We will continue with same treatment plan and she will follow-up next week. 02/15/18-She is here in follow-up evaluation for left great toe ulcer and abdominal  ulcer. The abdominal ulcer is epithelialized. The left great toe ulcer is improved and all injury from last week using the Coban wrap is resolved, the lateral ulcer is healed. She admits to noncompliance with wearing offloading shoe and admits to glucose levels being greater than 300 most of the week. She continues to smoke and expresses no desire to quit. There is one area medially that probes deeper than it has historically, erythema to the toe and dorsal foot has consistently waxed and waned. There is no overt signs of cellulitis or infection but we will culture the wound for any occult infection given the new area of depth and erythema. We will hold off on sensitivities for initiation of antibiotic therapy. 02/22/18-She is here in follow up evaluation for left great toe ulcer. There is overall significant improvement in both wound appearance, erythema and edema with changes made last week. She was not initiated on antibiotic therapy. Culture obtained last week showed oxacillin sensitive staph aureus, sensitive to clindamycin. Clindamycin has been called into the pharmacy but she has been instructed to hold off on initiation secondary to overall clinical improvement and her history of antibiotic intolerance. She has been instructed to contact the clinic with any noted changes/deterioration and the wound, erythema, edema and/or pain. She will follow-up next week. She continues to smoke and her glucose levels remain elevated >250; she admits to compliance with offloading shoe 03/01/18 on evaluation today patient appears to be doing fairly well in regard to her left first toe ulcer. She has been tolerating the dressing changes with the [facility] Dressing without complication and overall this has definitely showed signs of improvement according  to records as well is what the patient tells me today. I'm very pleased in that regard. She is having no pain today 03/08/18 She is here for follow up evaluation  of [name] left great toe ulcer. She remains non-compliant with glucose control and smoking cessation; glucose levels consistently >200. She states that she got new shoe inserts/peg assist. She admits to compliance with offloading. Since my last evaluation there is significant improvement. We will switch to prisma at this time and she will follow up next week. She is noted to be tachycardic at this appointment, heart rate 120s; she has [name] history of heart rate 70-130 according to our records. She admits to extreme agitation r/t personal issues; she was advised to monitor her heartrate and contact her physician if it does not return to [name] more normal range (<100). She takes cardizem twice daily. 03/15/18-She is here in follow-up evaluation for left great toe ulcer. She remains noncompliant with glucose control and smoking cessation. She admits to compliance with wearing offloading shoe. The ulcer is improved/stable and we will continue with the same treatment plan and she will follow-up next week 03/22/18-She is here for evaluation for left great toe ulcer. There continues to be significant improvement despite recurrent hyperglycemia (over 500 yesterday) and she continues to smoke. She has been compliant with offloading and we will continue with same treatment plan and she will follow-up next week. 03/29/18-She is here for evaluation for left great toe ulcer. Despite continuing to smoke and uncontrolled diabetes she continues to improve. She is compliant with offloading shoe. We will continue with the same treatment plan and she will follow-up next week 04/05/18- She is here in follow up evaluation for [name] left great toe ulcer; she presents with small pustule to left fifth toe (resembles ant bite). She admits to compliance with wearing offloading shoe; continues to smoke or have uncontrolled blood glucose control. There is more callus than usual with evidence of bleeding; she denies known trauma. 04/12/18-She is here  for evaluation of left great toe ulcer. Despite noncompliance with glycemic control and smoking she continues to make improvement. She continues to wear offloading shoe. The pustule, that was identified last week, to the left fifth toe is resolved. She will follow-up in 2 weeks 05/03/18-she is seen in follow-up evaluation for [name] left great toe ulcer. She is compliant with offloading, otherwise noncompliant with glycemic control and smoking. She has plateaued and there is minimal improvement noted. We will transition to [facility], replaced the insert to her surgical shoe and she will follow-up in one week 05/10/18- She is here in follow up evaluation for [name] left great toe ulcer. It appears stable despite measurement change. We will continue with same treatment plan and follow up next week. 05/24/18-She is seen in follow-up evaluation for [name] left great toe ulcer. She remains compliant with offloading, has made significant improvement in her diet, decreasing the amount of sugar/soda. She said her recent A1c was 10.9 which is lower than. She did see [name] diabetic nutritionist/educator yesterday. She continues to smoke. We will continue with the same treatment plan and she'll follow-up next week. 05/31/18- She is seen in follow-up evaluation for left great toe ulcer. She continues to remain compliant with offloading, continues to make improvement in her diet, increasing her water and decreasing the amount of sugar/soda. She does continue to smoke with no desire to quit. We will apply Prisma to the depth and Hydrofera Blue over. We have not received insurance authorization  for oasis. She will follow up next week. 06/07/18-She is seen in follow-up evaluation for left great toe ulcer. It has stalled according to today's measurements although base appears stable. She says she saw [name] diabetic educator yesterday; her average blood sugars are less than 300 which is an improvement for her. She continues to smoke and  states "that's my next step" She continues with water over soda. We will order for xray, culture and reinstate ace wrap compression prior to placing apligraf for next week. She is voicing no complaints or concerns. Her dressing will change to iodoflex over the next week in preparation for apligraf. 06/14/18-She is seen in follow-up evaluation for left great toe ulcer. Plain film x-ray performed last week was negative for osteomyelitis. Wound culture obtained last week grew strep B and OSSA; she is initiated on keflex and cefdinir today; there is erythema to the toe which could be from ace wrap compression, she has [name] history of wrapping too tight and has has been encouraged to maintain ace wraps that we place today. We will hold off on application of apligraf today, will apply next week after antibiotic therapy has been initiated. She admits today that she has resumed taking [name] shower with her foot/toe submerged in water, she has been reminded to keep foot/toe out of the bath water. She will be seen in follow up next week 06/21/18-she is seen in follow-up evaluation for left great toe ulcer. She is tolerating antibiotic therapy with no GI disturbance. The wound is stable. Apligraf was applied today. She has been decreasing her smoking, only had 4 cigarettes yesterday and 1 today. She continues being more compliant in diabetic diet. She will follow-up next week for evaluation of site, if stable will remove at 2 weeks. 06/28/18- She is here in follow up evalution. Apligraf was placed last week, she states the dressing fell off on Tuesday and she was dressing with hydrofera blue. She is healed and will be discharged from the clinic today. She has been instructed to continue with smoking cessation, continue monitoring glucose levels, offloading for an additional 4 weeks and continue with hydrofera blue for additional two weeks for any possible microscopic opening. Readmission: 08/07/18 on evaluation today  patient presents for reevaluation concerning the ulcer of her right great toe. She was previously discharged on 06/28/18 healed. Nonetheless she states that this began to show signs of drainage she subsequently went to her primary care provider. Subsequently an x-ray was performed on 08/01/18 which was negative. The patient was also placed on antibiotics at that time. Fortunately they should have been effective for the infection. Nonetheless she's been experiencing some improvement but still has [name] lot of drainage coming from the wound itself. 08/14/18 on evaluation today patient's wound actually does show signs of improvement in regard to the erythema at this point. She has completed the antibiotics. With that being said we did discuss the possibility of placing her in [name] total contact cast as of today although I think that I may want to give this just [name] little bit more time to ensure nothing recurrence as far as her infection is concerned. I do not want to put in the cast and risk infection at that time if things are not completely resolved. With that being said she is gonna require some debridement today. 08/21/18 on evaluation today patient actually appears to be doing okay in regard to her toe ulcer. She's been tolerating the dressing changes without complication. With that being said it  does appear that she is ready and in fact I think it's appropriate for [place] to go ahead and initiate the total contact cast today. Nonetheless she will require some sharp debridement to prepare the wound for application. Overall I feel like things have been progressing well but we do need to do something to get this to close more readily. 08/24/18 patient seen today for reevaluation after having had the total contact cast applied on Tuesday. She seems to have done very well the wound appears to be doing great and overall I'm pleased with the progress that she's made. There were no abnormal areas of rubbing from the cast on  her lower extremity. [name], [name] ([id number]) [id number] Page 3 of 20 08/30/18 on evaluation today patient actually appears to be completely healed in regard to her plantar toe ulcer. She tells me at this point she's been having [name] lot of issues with the cast. She almost fell [name] couple of times the state shall the step of her dog [name] couple times as well. This is been [name] very frustrating process for her other nonetheless she has completely healed the wound which is excellent news. Overall there does not appear to be the evidence of infection at this time which is great news. 09/11/18 evaluation today patient presents for follow-up concerning her great toe ulcer on the left which has unfortunately reopened since I last saw her which was only [name] couple of weeks ago. Unfortunately she was not able to get in to get the shoe and potentially the AFO that's gonna be necessary due to her left foot drop. She continues with offloading shoe but this is not enough to prevent her from reopening it appears. When we last had her in the total contact cast she did well from [name] healing standpoint but unfortunately the wound reopened as soon as she came out of the cast within just [name] couple of weeks. 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 This is also part of the reason why she falls on [name] regular basis. I just do not believe that is gonna be safe for [place] to reinitiate the total contact cast as last time we had this on she fell 3 times one week which is definitely not normal for her. 09/18/18 upon evaluation today the patient actually appears to be doing about the same in regard to her toe ulcer. She did not contact [employer] as I asked her to even though I had given her the prescription. In fact she  actually states that she has no idea where the prescription is. She did apparently call [employer] and they told her that all she needed to do was bring the prescription in order to be able to be seen and work on getting the AFO for her left foot. With all that being said she still does not have an appointment and I'm not sure were things stand that regard. I will give her [name] new prescription today in order to contact them to get this set up. 09/25/18 on evaluation today patient actually appears to be doing about the same in regard to her toes ulcer. She does have [name] small areas which seems to have [name] lot of callous buildup around the edge of the wound which is going  to need sharp debridement today. She still is waiting to be scheduled for evaluation with [employer] for possibility of an AFO. She states there supposed to call her tomorrow to get this set up. Unfortunately it does appear that her foot specifically the toe area is showing signs of erythema. There does not appear to be any systemic infection which is in these good news. 10/02/18 on evaluation today patient actually appears to be doing about the same in regard to her toe ulcer. This really has not done too well although it's not significantly larger it's also not significantly smaller. She has been tolerating the dressing changes without complication. She actually has her appointment with [employer] and [place] tomorrow to hopefully be measured for obtaining and AFO splint. I think this would be helpful preventing this from reoccurring. We had contemplated starting the cast this week although to be honest I am reluctant to do that as she's been having nausea, vomiting, and seizure activity over the past three days. She has [name] history of seizures and have been told is nothing that can be done for these. With that being said I do believe that along with the seizures have the nausea vomiting which upon further questioning doesn't seem to be the normal for  her and makes me concerned for the possibility of infection or something else going on. I discussed this with the patient and her mother during the office visit today. I do not feel the wound is effective but maybe something else. The responses this was "this just happens to her at times and we don't know why". They did not seem to be interested in going to the hospital to have this checked out further. 10/09/18 on evaluation today patient presents for follow-up concerning her ongoing toe ulcer. She has been tolerating the dressing changes without complication. Fortunately there does not appear to be any evidence of infection which is great news however I do think that the patient would benefit from going ahead for with the total contact cast. She's actually in [name] wheelchair today she tells me that she will use her walker if we initiate the cast. I was very specific about the fact that if we were gonna do the cast I wanted to make sure that she was using the walker in order to prevent any falls. She tells me she does not have stairs that she has to traverse on [name] regular basis at her home. She has not had any seizures since last week again that something that happens to her often she tells me she did talk to [employer] and they said that it may take up to three weeks to get the brace approved for her. Hopefully that will not take that long but nonetheless in the meantime I do think the cast could be of benefit. 10/12/18 on evaluation today patient appears to be doing rather well in regard to her toe ulcer. It's just been [name] few days and already this is significantly improved both as far as overall appearance and size. Fortunately there's no sign of infection. She is here for her first obligatory cast change. 10/19/18 Seen today for follow up and management of left great toe ulcer. Wound continues to show improvement. Noted small open area with seroussang drainage with palpation. Denies any increased pain or  recent fevers during visit. She will continue calcium alginate with offloading shoe. Denies any questions or concerns during visit. 10/26/18 on evaluation today patient appears to be doing about the same as  when I last saw her in regard to her wound bed. Fortunately there does not appear to be any signs of infection. Unfortunately she continues to have [name] breakdown in regard to the toe region any time that she is not in the cast. It takes almost no time at all for this to happen. Nonetheless she still has not heard anything from the brace being made by [employer] as to when exactly this will be available to her. Fortunately there is no signs of infection at this time. 10/30/18 on evaluation today patient presents for application of the total contact cast as we just received him this morning. Fortunately we are gonna be able to apply this to her today which is great news. She continues to have no significant pain which is good news. Overall I do feel like things have been improving while she was the cast is when she doesn't have [name] cast that things get worse. She still has not really heard anything from [employer] regarding her brace. 11/02/18 upon evaluation today patient's wound already appears to be doing significantly better which is good news. Fortunately there does not appear to be any signs of infection also good news. Overall I do think the total contact cast as before is helping to heal this area unfortunately it's just not gonna likely keep the area closed and healed without her getting her brace at least. Again the foot drop is [name] significant issue for her. 11/09/18 on evaluation today patient appears to be doing excellent in regard to her toe ulcer which in fact is completely healed. Fortunately we finally got the situation squared away with the paperwork which was needed to proceed with getting her brace approved by Medicaid. I have filled that out unfortunately that information has been sent to the  orthopedic office that I worked at 2 1/2 years ago and not tired Current wound care measures. Fortunately she seems to be doing very well at this time. 11/23/18 on evaluation today patient appears to be doing More Poorly Compared to Last Time I Saw Her. At [facility] She Had Completely Healed. Currently she is continuing to have issues with reopening. She states that she just found out that the brace was approved through Medicaid now she just has to go get measured in order to have this fitted for her and then made. Subsequently she does not have an appointment for this yet that is going to complicate things we obviously cannot put her back in the cast if we do not have everything measured because they're not gonna be able to measure her foot while she is in the cast. 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 Obviously this is unfortunate and does have me somewhat worried as well. 11/30/18 on evaluation today patient's toe ulcer actually appears to be doing fairly well. The good news is she will be getting her brace in the shoes next week on Wednesday. Hopefully we will be able to get this to heal without having to go back in the cast however she may need the cast in order to get the wound completely heal and then go from there. Fortunately there's no signs of infection at this time. 12/07/18 on evaluation today patient fortunately did receive her brace and she states she could tell this definitely makes her walk better. With that being said she's been having issues with her toe  where she noticed yesterday there was [name] lot of tissue that was loosing off this appears to be much larger than what it was previous. She also states that her leg has been read putting much across the top of her foot just about the ankle although this seems to be receiving somewhat. The total area is still red and appears to be  someone infected as best I can tell. She is previously taken Bactrim and that may be [name] good option for her today as well. We are gonna see what I wound culture shows as well and I think that this is definitely appropriate. With that being said outside of the culture I still need to initiate something in the interim and that's what I'm gonna go ahead and select Bactrim is [name] good option for her. 12/14/18 on evaluation today patient appears to be doing better in regard to her left great toe ulcer as compared to last week's evaluation. There's still some erythema although this is significantly improved which is excellent news. Overall I do believe that she is making good progress is still gonna take some time before she is where I would like her to be from the standpoint of being able to place her back into the total contact cast. Hopefully we will be where we need to be by next week. 12/21/18 on evaluation today patient actually appears to be doing poorly in regard to her toe ulcer. She's been tolerating the dressing changes without complication. Fortunately there's no signs of systemic infection although she does have [name] lot of drainage from the toe ulcer and this does seem to be causing [name], [name] ([id number]) [id number] Page 4 of 20 some issues at this point. She does have erythema on the distal portion of her toe that appears to be likely cellulitis. 12/28/18 on evaluation today patient actually appears to be doing [name] little better in my pinion in regard to her toe ulcer. With that being said she still does have some evidence of infection at this time and for her culture she had both E. coli as well as enterococcus as organisms noted on evaluation. For that reason I think that though the Keflex likely has treated the E. coli rather well this has really done nothing for the enterococcus. We are going to have to initiate treatment for this specifically. 01/04/19 on evaluation  today patient's toe actually appears to be doing better from the standpoint of infection. She currently would like to see about putting the cash back on I think that this is appropriate as long as she takes care of it and keeps it from getting wet. She is gonna have some drainage we can definitely pass this up with Drawtex and alginate to try to prevent as much drainage as possible from causing the problems. With that being said I do want to at least try her with the cast between now and Tuesday. If there any issues we can't continue to use it then I will discontinue the use of the cast at that point. 01/08/19 on evaluation today patient actually appears to be doing very well as far as her foot ulcer specifically the great toe on the left is concerned. She did have an area of rubbing on the medial aspect of her left ankle which again is from the cast. Fortunately there's no signs of infection at this point in this appears to be [name] very slight skin breakdown. The patient tells me she felt it rubbing but didn't  think it was that bad. Fortunately there is no signs of active infection at this time which is good news. No fevers, chills, nausea, or vomiting noted at this time. 01/15/19 on evaluation today patient actually appears to be doing well in regard to her toe ulcer. Again as previous she seems to do well and she has the cast on which indicates to me that during the time she doesn't have [name] cast on she's putting way too much pressure on this region. Obviously I think that's gonna be an issue as with the current national emergency concerning the Covid-19 Virus it has been recommended that we discontinue the use of total contact casting by the chief medical officer of our company, Dr. Maurine Minister. The reasoning is that if [name] patient becomes sick and cannot come into have the cast removed they could not just leave this on for an additional two weeks. Obviously the hospitals also do not want to receive patient's who  are sick into the emergency department to potentially contaminate the region and spread the Covid-19 Virus among other sick individuals within the hospital system. Therefore at this point we are suspending the use of total contact cast until the current emergency subsides. This was all discussed with the patient today as well. 01/22/19 on evaluation today patient's wound on her left great toe appears to be doing slightly worse than previously noted last week. She tells me that she has been on this quite [name] bit in fact she tells me she's been awake for 38 straight hours. This is due to the fact that she's having to care for grandparents because nobody else will. She has been taking care of them for five the last seven days since I've seen her they both have dementia his is from [name] stroke and her grandmother's was progressive. Nonetheless she states even her mom who knows her condition and situation has only help two of those days to take care of them she's been taking care of the rest. Fortunately there does not appear to be any signs of active infection in regard to her toe at this point although obviously it doesn't look as good as it did previous. I think this is directly related to her not taking off the pressure and friction by way of taking things easy. Though I completely understand what's going on. 01/29/19 on evaluation today patient's tools are actually appears to be showing some signs of improvement today compared to last week's evaluation as far as not necessarily the overall size of the wound but the fact that she has some new skin growth in between the two ends of the wound opening. Overall I feel like she has done well she states that she had [name] family member give her what sounds to be [name] CAM walker boot which has been helpful as well. 02/05/19 on evaluation today patient's wound bed actually appears to be doing significantly better in regard to her overall appearance of the size of the  wound. With that being said she is still having an issue with offloading efficiently enough to get this to close. Apparently there is some signs of infection at this point as well unfortunately. Previously she's done well of Augmentin I really do not see anything that needs to be culture currently but there theme and cellulitis of the foot that I'm seeing I'm gonna go ahead and place her on an antibiotic today to try to help clear this up. 02/12/2019 on evaluation today patient actually appears to  be doing poorly in regard to her overall wound status. She tells me she has been using her offloading shoe but actually comes in today wearing her tennis shoe with the AFO brace. Again as I previously discussed with her this is really not sufficient to allow the area to heal appropriately. Nonetheless she continues to be somewhat noncompliant and I do wonder based on what she has told my nurse in the past as to whether or not [name] good portion of this noncompliance may be recreational drug and alcohol related. She has had [name] history of heroin overdose and this was fairly recently in the past couple of months that have been seeing her. Nonetheless overall I feel like her wound looks significantly worse today compared to what it was previous. She still has significant erythema despite the Augmentin I am not sure that this is an appropriate medication for her infection I am also concerned that the infection may have gone down into her bone. 02/19/19 on evaluation today patient actually appears to be doing about the same in regard to her toe ulcer. Unfortunately she continues to show signs of bone exposure and infection at this point. There does not appear to be any evidence of worsening of the infection but I'm also not really sure that it's getting significantly better. She is on the Augmentin which should be sufficient for the Staphylococcus aureus infection that she has at this point. With that being said she may  need IV antibiotics to more appropriately treat this. We did have [name] discussion today about hyperbaric option therapy. 02/28/19 on evaluation today patient actually appears to be doing much worse in regard to the wound on her left great toe as compared to even my previous evaluation last week. Unfortunately this seems to be training in [name] pretty poor direction. Her toe was actually now starting to angle laterally and I can actually see the entire joint area of the proximal portion of the digit where is the distal portion of the digit again is no longer even in contact with the joint line. Unfortunately there's [name] lot more necrotic tissue around the edge and the toe appears to be showing signs of becoming gangrenous in my pinion. I'm very concerned about were things stand at this point. She did see infectious disease and they are planning to send in [name] prescription for Sivextro for her and apparently this has been approved. With that being said I don't think she should avoid taking this but at the same time I'm not sure that it's gonna be sufficient to save her toe at this point. She tells me that she still having to care for grandparents which I think is putting quite [name] bit of strain on her foot and specifically the total area and has caused this to break down even to [name] greater degree than would've otherwise been expected. 03/05/19 on evaluation today patient actually appears to be doing quite well in regard to her toe all things considering. She still has bone exposed but there appears to be much less your thing on overall the appearance of the wound and the toe itself is dramatically improved. She still does have some issues currently obviously with infection she did see vascular as well and there concerned that her blood flow to the toad. For that reason they are setting up for an angiogram next week. 03/14/19 on evaluation today patient appears to be doing very poor in regard to her toe and specifically in  regard to  the ulceration and the fact that she's starting to notice the toe was leaning even more towards the lateral aspect and the complete joint is visible on the proximal aspect of the joint. Nonetheless she's also noted [name] significant odor and the tip of the toe is turning more dark and necrotic appearing. Overall I think she is getting worse not better as far as this is concerned. For that reason I am recommending at this point that she likely needs to be seen for likely amputation. READMISSION 03/19/2021 This is [name] patient that we cared for in this clinic for [name] prolonged period of time in 2019 and 2020 with [name] left foot and left first toe wound. 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 Excell Seltzer. In December 2021 she had an ulcer on her right great toe as well as the fourth and fifth toes. 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 She also had an angiogram at that time and underwent angioplasty of the right anterior tibial artery. In any case she claims that the wound on the right foot is closed I did not look at this today which was probably an oversight although I think that should be done next week. 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 According to Dr. Bernette Redbird last review that she was out of the area being cared for by another physician but recently came back to his attention. 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 [name] culture of this area showed E. coli apparently before she came back to see Dr. Excell Seltzer she was supposed to be receiving antibiotics but she did not really take them. Nor is she offloading this area at all. Finally her last hemoglobin A1c listed in epic was in March 2022 at 14.1 she says things are [name] lot better since then although I am not sure. She was hospitalized in  March with metabolic multifactorial encephalopathy. 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 She had this wound at the time. During this admission she had E. coli sepsis [name] TEE was negative. 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 She is still [name] EBANY, RAVINDRAN [name] ([id number]) [id number] Page 5 of 20 heavy cigarette smoker. She has [name] history of bipolar disorder seizure disorder ABI in our clinic was 1.05 6/1; left midfoot in the setting of [name] TMA done previously. Round circular wound with [name] "knuckle" of protruding tissue. The problem is that the knuckle was not attached to any of the surrounding granulation and this probed proximally widely I removed [name] large portion of this tissue. This wound goes with considerable undermining laterally. I do not feel any bone there was no purulence but this is [name] deep wound. 6/8; in spite of the debridement I did last week. She arrives with [name] wound looking exactly the same. [name] protruding "knuckle" of tissue nonadherent to most of the surrounding tissue. There is considerable depth around this from 6-12 o'clock at 2.7 cm and undermining of 1 cm. This does not look overtly infected and the x- ray I did last week was negative for  any osseous abnormalities. We have been using silver collagen 6/15; deep tissue culture I did last week showed moderate staph aureus and moderate Pseudomonas. This will definitely require prolonged antibiotic therapy. The pathology on the protuberant area was negative for malignancy fungus etc. 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 We have been using silver collagen. I am going to be  prescribing Levaquin for 2 weeks. Her CT scan of the foot is down for 7/5 6/22; CT scan of the foot on 7 5. She says she has hardware in the left leg from her previous fracture. She is on the Levaquin for the deep tissue culture I did that showed methicillin sensitive staph aureus and Pseudomonas. I gave her [name] 2-week supply and she will have another week. She arrives in clinic today with the same protuberant tissue however this is nonadherent to the tissue surrounding it. I am really at [name] loss to explain this unless there is underlying deep tissue infection 6/29; patient presents for 1 week follow-up. She has been using collagen to the wound bed. She reports taking her antibiotics as prescribed.She has no complaints or issues today. She denies signs of infection. 7/6; patient presents for one week followup. She has been using collagen to the wound bed. She states she is taking Levaquin however at times she is not able to keep it down. She denies signs of infection. 7/13; patient presents for 1 week follow-up. She has been using silver alginate to the wound bed. She still has nausea when taking her antibiotics. She denies signs of infection. 7/20; patient presents for 1 week follow-up. She has been using silver alginate with gentamicin cream to the wound bed. She denies any issues and has no complaints today. She denies signs of infection. 7/27; patient presents for 1 week follow-up. She continues to use silver alginate with gentamicin cream to the wound bed. She reports starting her antibiotics. She has no issues or complaints. Overall she reports stability to the wound. 8/3; patient presents for 1 week follow-up. She has been using silver alginate with gentamicin cream to the wound bed. She reports completing all antibiotics. She has no issues or complaints today. She denies signs of infection. 8/17; patient presents for 2-week follow-up. He is to use silver alginate to the wound bed. She has no  issues or complaints today. She denies signs of infection. She reports her pain has improved in her foot since last clinic visit 8/24; patient presents for 1 week follow-up. She continues to use silver alginate to the wound bed. She has no issues or complaints. She denies signs of infection. Pain is stable. 9/7; patient presents for follow-up. She missed her last week appointment due to feeling ill. She continues to use silver alginate. She has [name] new wound to the right lower extremity that is covered in eschar. She states It occurred over the past week and has no idea how it started. She currently denies signs of infection. 9/14; patient presents for follow-up. T the left foot wound she has been using gentamicin cream and silver alginate. T the right lower extremity wound she has o o been keeping this covered and has not obtain Santyl. 9/21; patient presents for follow-up. She reports using gentamicin cream and silver alginate to the left foot and Santyl to the right lower extremity wound. She has no issues or complaints today. She denies signs of infection. 9/28; patient presents for follow-up. She reports [name] new wound to  her right heel. She states this occurred [name] few days ago and is progressively gotten worse. She has been trying to clean the area with [name] Q-tip and Santyl. She reports stability in the other 2 wounds. She has been using gentamicin cream and silver alginate to the left foot and Santyl to the right lower extremity wound. 10/12; patient presents for follow-up. She reports improvement to the wound beds. She is seeing vein and vascular to discuss the potential of [name] left BKA. She states they are going to do an arteriogram. She continues to use silver alginate with dressing changes to her wounds. 11/2; patient presents for follow-up. She states she has not been doing dressing changes to the wound beds. She states she is not able to offload the areas. She reports chronic pain to her left  foot wound. 11/9; patient presents for follow-up. She came in with only socks on. She states she forgot to put on shoes. It is unclear if she is doing any dressing changes. She currently denies systemic signs of infection. 11/16; patient presents for follow-up. She came again only with socks on. She states she does not wear shoes ever. It is unclear if she does dressing changes. She currently denies systemic signs of infection. 11/23; patient presents for follow-up. She wore her shoes today. It still unclear exactly what dressing she is using for each wound but she did states she obtained Dakin's solution and has been using this to the left foot wound. She currently denies signs of infection. 11/30; patient presents for follow-up. She has no issues or complaints today. She currently denies signs of infection. 12/7; patient presents for follow-up. She has no issues or complaints today. She has been using Hydrofera Blue to the right heel wound and Dakin solution to the left foot wound. Her right anterior leg wound is healed. She currently denies signs of infection. 12/14; patient presents for follow-up. She has been using Hydrofera Blue to the right heel and Dakin's to the left foot wounds. She has no issues or complaints today. She denies signs of infection. 12/21; patient presents for follow-up. She reports using Hydrofera Blue to the right heel and Dakin's to the left foot wound. She denies signs of infection. 12/28; patient presents for follow-up. She continues to use Dakin's to the left foot wound and Hydrofera Blue to the right heel wound. She denies signs of infection. 1/4; patient presents for follow-up. She has no issues or complaints today. She denies signs of infection. 1/11; patient presents for follow-up. It is unclear if she has been dressing these wounds over the past week. She currently denies signs of infection. 1/18; patient presents for follow-up. She states she has been using  Dakin's wet-to-dry dressings to the left foot. She has been using Hydrofera Blue to the right foot foot wound. She states that the anterior right leg wound has reopened and draining serous fluid. She denies signs of infection. 1/25; patient presents for follow-up. She has no issues or complaints today. 2/1; patient presents for follow-up. She has no issues or complaints today. She denies signs of infection. 2/8; patient presents for follow-up. She has lost her surgical shoes. She did not have [name] dressing to the right heel wound. She currently denies signs of infection. 2/15; patient presents for follow-up. She reports more pain to the right heel today. She denies purulent drainage Or fever/chills 2/22; patient presents for follow-up. She reports taking clindamycin over the past week. She states that she continues to have  pain to her right heel. She reports purulent drainage. KAELAH, STUMPF [name] ([id number]) [id number] Page 6 of 20 Readmission 03/16/2022 Ms. Jackeline Heinle is [name] 47 year old female with [name] past medical history of type 2 diabetes, osteomyelitis to her feet, chronic systolic heart failure and bipolar disorder that presents to the clinic for bilateral feet wounds and right lower extremity wound. She was last seen in our clinic on 12/15/2021. At that time she had purulent drainage coming out of her right plantar foot and I recommended she go to the ED. She states she went to [facility] and has been there for the past 3 months. I cannot see the records. She states she had OR debridement and was on several weeks of IV antibiotics while inpatient. Since discharge she has not been taking care of the wound beds. She had nothing on her feet other than socks today. She currently denies signs of infection. 5/31; patient presents for follow-up. She has been using Dakin's wet-to-dry dressings to the wound beds on her feet bilaterally and antibiotic ointment to  the right anterior leg wound. She had [name] wound culture done at last clinic visit that showed moderate Pseudomonas aeruginosa sensitive to ciprofloxacin. She currently denies systemic signs of infection. 6/14; patient presents for follow-up. She received Keystone 5 days ago and has been using this on the wound beds. She states that last week she had to go to the hospital because she had increased warmth and erythema to the right foot. She was started on 2 oral antibiotics. She states she has been taking these. She currently denies systemic signs of infection. She has no issues or complaints today. 6/21; patient presents for follow-up. She states she has been using Keystone antibiotics to the wound beds. She has no issues or complaints today. She denies signs of infection. 6/28; patient presents for follow-up. She has been using Keystone antibiotics to the wound beds. She has no issues or complaints today. 7/12; patient presents for follow-up. Has been using Keystone antibiotics to the wound beds with calcium alginate. She has no issues or complaints today. She never followed up with her orthopedic surgeon who did the OR debridement to the right foot. We discussed the total contact cast for the left foot and patient would like to do this next week. 7/19; patient presents for follow-up. She has been using Keystone antibiotics with calcium alginate to the wound beds. She has no issues or complaints today. Patient is in agreement to do the total contact cast of the left foot today. She knows to return later this week for the obligatory cast change. 05-13-2022 upon evaluation today patient's wound which she has the cast of the left leg actually appears to be doing significantly better. Fortunately I do not see any signs of active infection locally or systemically which is great news and overall I am extremely pleased with where we stand currently. 7/26; patient presents for follow-up. She has [name] cast in place  for the past week. She states it irritated her shin. Other than that she tolerated the cast well. She states she would like [name] break for 1 week from the cast. We have been using Keystone antibiotic and Aquacel to both wound beds. She denies signs of infection. 8/2; patient presents for follow-up. She has been using Keystone and Aquacel to the wound beds. She denies any issues and has no complaints. She is agreeable to have the cast placed today for the left leg. 06-03-2022 upon evaluation today patient appears  to be doing well with regard to her wound she saw some signs of improvement which is great news. Fortunately I do not see any evidence of active infection locally or systemically at this time which is great news. No fevers, chills, nausea, vomiting, or diarrhea. 8/16; patient presents for follow-up. She has no issues or complaints today. We have been using Keystone and Aquacel to the wound beds. The left lower extremity is in [name] total contact cast. She is tolerated this well. 8/23; patient presents for follow-up. She has had the total contact cast on the left leg for the past week. Unfortunately this has rubbed and broken down the skin to the medial foot. She currently denies signs of infection. She has been using Keystone antibiotic to the right plantar foot wound. 8/30; patient presents for follow-up. We have held off on the total contact cast for the left leg for the past week. Her wound on the left foot has improved and the previous surrounding breakdown of skin has epithelialized. She has been using Keystone antibiotic to both wound beds. She has no issues or complaints today. She denies signs of infection. 9/6; patient presents for follow-up. She has ordered her's Keystone antibiotic and this is arriving this week. She has been doing Dakin's wet-to-dry dressings to the wound beds. She denies signs of infection. She is agreeable to the total contact cast today. 9/13; patient presents for  follow-up. She states that the cast caused her left leg shin to be sore. She would like to take [name] break from the cast this week. She has been using Keystone antibiotic to the right plantar foot wound. She denies signs of infection. 9/20; patient presents for follow-up. She has been using Keystone antibiotics to the wound beds with calcium alginate to the right foot wound and Hydrofera Blue to the left foot wound. She is agreeable to having the cast placed today. She has been approved for Apligraf and we will order this for next clinic visit. 9/27; patient presents for follow-up. We have been using Keystone antibiotic with Hydrofera Blue to the left foot wound under [name] total contact cast. T the right o foot wound she has been using Keystone antibiotic and calcium alginate. She declines [name] total contact cast today. Apligraf is available for placement and she would like to proceed with this. 07-28-2022 upon evaluation today patient appears to be doing well currently in regard to her wound. She is actually showing signs of significant improvement which is great news. Fortunately I do not see any evidence of active infection locally nor systemically at this time. She has been seeing Dr. Mikey Bussing and to be honest has been doing very well with the cast. Subsequently she comes in today with [name] cast on and we did reapply that today as well. She did not really want to she try to talk me out of that but I explained that if she wanted to heal this is really the right way to go. Patient voiced understanding. In regard to her right foot this is actually [name] lot better compared to the last time I saw her which is also great news. 10/11; patient presents for follow-up. Apligraf and the total contact cast was placed to the left leg at last clinic visit. She states that her right foot wound had burning pain to it with the placement of Apligraf to this area. She has been doing Celoron over this area. She denies signs of  infection including increased warmth, erythema or purulent drainage.  11/1; 3-week follow-up. The patient fortunately did not have [name] total contact cast or an Apligraf and on the left foot. She has been using Keystone ABD pads and kerlix and her own running shoes She arrives in clinic today with thick callus and [name] very poor surface on the left foot on the right nonviable skin subcutaneous tissue and [name] deep probing hole. 11/15; patient missed her last clinic appointment. She states she has not been dressing the wound beds for the past 2 weeks. She states that at she had [name] new roommate but is now going back to live with her mother. Apparently its been [name] distracting 2 weeks. Patient currently denies signs of infection. 11/22; patient presents for follow-up. She states she has been using Keystone antibiotic and Dakin's wet-to-dry dressings to the wound beds. She is agreeable for cast placement today. We had ordered Apligraf however this has not been received by our facility. 11/29; Patient had [name] total contact cast placed at last clinic visit and she tolerated this well. We were using silver alginate under the cast. Patient's been using Keystone antibiotic with Aquacel to the right plantar foot wound. She has no issues or complaints today. Apligraf is available for placement today. Patient would like to proceed with this. 12/6; patient presents for follow-up. She had Apligraf placed in standard fashion last clinic visit under the total contact cast to the left lower extremity. She has been using Keystone antibiotic and Aquacel to the right plantar foot wound. She has no issues or complaints today. CLELLA, MCCOSH [name] ([id number]) [id number] Page 7 of 20 12/13; patient presents for follow-up. She has finished 5 Apligraf placements. Was told she would not qualify for more. We have been doing [name] total contact cast to the left lower extremity. She has been using Keystone antibiotic and  Aquacel to the right plantar foot wound. She has no issues or complaints today. 12/20; patient presents for follow-up. We have been using Hydrofera Blue with Keystone antibiotic under [name] total contact cast of the left lower extremity. She reports using Keystone antibiotic and silver alginate to the right heel wound. She has no issues or complaints today. 12/27; patient presents with [name] healthy wound on the left midfoot. We have Apligraf to apply that to that more also using [name] total contact cast. On the right we are using Keystone and silver alginate. She is offloading the right heel with [name] surgical shoealthough by her admission she is on her feet quite [name] bit 1/3; patient presents for follow-up. Apligraf was placed to the wound beds last clinic visit. She was placed in [name] total contact cast to the left lower extremity. She declines [name] total contact cast today. She states that her mother is in the hospital and she cannot adequately get around with the cast on. 1/10; patient presents for follow-up. She declined the total contact cast at last clinic visit. Both wounds have declined in appearance. She states that she has been on her feet and not offloading the wound beds. She currently denies signs of infection. 1/17; patient presents for follow-up. She had the total contact cast along with Apligraf placed last week to the left lower extremity. She tolerated this well. She has been using Aquacel Ag and Keystone antibiotic to the right heel wound. She currently denies signs of infection. She has no issues or complaints today. 1/24; patient presents for follow-up. We have been using Apligraf to the left foot wound along with [name] total contact cast. She has done  well with this. T the right o heel wound she has been using Aquacel Ag and Keystone antibiotic ointment. She has no issues or complaints today. She denies signs of infection. 1/31; patient presents for follow-up. We have been using Apligraf to the left foot  wound along with the total contact cast. She continues to do well with this. To the right heel we have been using Aquacel Ag and Keystone antibiotic ointment. She has no issues or complaints today. 12-01-2022 upon evaluation patient is seen today on my schedule due to the fact that she unfortunately was in the hospital yesterday. Her cast needed to come off the only reason she is out of the hospital is due to the fact that they would not take it off at the hospital which is somewhat bewildered reading to me to be perfectly honest. I am not certain why this was but either way she was released and then was placed on my schedule today in order to get this off and reapply the total contact casting as appropriate. I do not have an Apligraf for her today it was applied last week and today's actually expired yesterday as there was some scheduling conflicts with her being in the hospital. Nonetheless we do not have that for reapplication today but the good news that she is not draining too much and the Apligraf can go for up to 2 weeks so I am going to go ahead and reapply the total contact casting but we are going to leave the Apligraf in place. 2/14; patient presents for follow-up. T the left leg she has had the total contact cast and Apligraf for the past week. She has had no issues with this. T the o o right heel she has been using antibiotic ointment and Aquacel Ag. 2/21; patient presents for follow-up. We have been using Apligraf and [name] total contact cast to the left lower extremity. She is tolerated this well. Unfortunately she is not approved for any more Apligraf per insurance. She has been using antibiotic ointment and Aquacel Ag to the right foot. She has no issues or complaints today. 2/28; patient presents for follow-up. We have been using Hydrofera Blue and antibiotic ointment under the total contact cast to the left lower extremity. He has been using Aquacel Ag with antibiotic ointment to the  right plantar foot. She has no issues or complaints today. 3/6; patient presents for follow-up. She did not obtain her gentamicin ointment. She has been using Aquacel Ag to the right plantar foot wound. We have been using Hydrofera Blue with antibiotic ointment under the total contact cast to the left lower extremity. She has no issues or complaints today. 3/12; patient presents for follow-up. She has been using gentamicin ointment and Aquacel Ag to the right plantar foot wound. We have been using Hydrofera Blue with antibiotic ointment under the total contact cast on the left lower extremity foot wound. She has no issues or complaints today. 3/20; patient presents for follow-up. She has been using gentamicin ointment and Aquacel Ag to the right plantar foot wound. We have been using Hydrofera Blue with antibiotic ointment under the total contact cast to the left lower extremity wound. She has no issues or complaints today. 3/27; patient presents for follow-up. We have been using antibiotic ointment and Aquacel Ag to the right plantar foot wound. We have been using Hydrofera Blue with antibiotic ointment under the total contact cast to the left lower extremity. Both wounds are smaller. 01-24-2023 upon evaluation  today patient actually appears to be doing better compared to last time I saw her. It has been several weeks since I last saw her but nonetheless I think the total contact casting is doing [name] good job. Fortunately I do not see any evidence of infection or worsening overall which is great news and in general I do believe that she is moving in the appropriate direction. It has been [name] very long road but nonetheless I feel like she is nearing the end at least in regard to the left foot and even the right foot looks much better to be perfectly honest. 01-31-2023 upon evaluation today patient appears to be doing well currently in regard to her wounds. She has been require some sharp debridement which I  think is probably bone both sides to remove [name] lot of callus that has built up at this point. I discussed that with her today. Also think total contact cast is doing well for the left foot [name] continue that as well. 4/17; patient presents for follow-up. We have been doing Hydrofera Blue with antibiotic ointment under the total contact cast to the left lower extremity and Aquacel Ag with antibiotic ointment to the right foot wound. She has been using her offloading heel shoe here. 4/24; patient presents for follow-up. We have been doing Hydrofera Blue with antibiotic ointment under the total contact cast to the left lower extremity and Aquacel Ag with antibiotic ointment to the right foot wound. She has been using her offloading heel shoe here. Wounds are smaller with slough accumulation. 5/1; patient presents for follow-up. We have been doing Hydrofera Blue with antibiotic ointment under the total contact cast to the left lower extremity and Aquacel Ag with antibiotic ointment to the right foot wound. Left foot wound is stable and right foot wound appears smaller. 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 On the left she is using Hydrofera Blue topical antibiotic under [name] total contact cast, on the right plantar heel gentamicin Aquacel Ag. According to our intake nurse both are making slow but steady improvements. 5/15; patient presents for follow-up. We have been doing Hydrofera Blue with antibiotic ointment under the total contact cast on the left and antibiotic ointment with Aquacel Ag on the right. Unfortunately the left foot wound has declined in size and appearance. We also do not have the total contact cast available in office today. Patient denies signs of infection. 5/22; patient presents for follow-up. We have been doing Hydrofera Blue and antibiotic ointment to the left foot and Aquacel Ag with antibiotic ointment to the right foot. We took [name] break from the cast since we did not have it  placed at last week. Wounds look stable if not slightly improved. 03-27-2023 patient presents today she has had this For Most 2 Weeks Because She Was in the Hospital and They Did Not T It off. Again That Is Really Not ake Something That She Could Control but Nonetheless We Are Seeing Her at This Point Today for Reevaluation. We Do Want to Go and See about Reapply the Cast. Again We Had All Set Everything up to Go Ahead and Do This When We Went to Get the Cast Only to Realize That Not Had Come in and We Were out at the Moment. With That Being York Spaniel We Will Get Have T Get the Exam before We Can Reapply the T Contact Casting Going Forward. o otal 6/12; patient presents for follow-up. She has been using Hydrofera Blue to the left  foot wound and Aquacel Ag to the right foot wound. No cast was available last week placed on the patient so she has been offloading with her surgical shoe. She has no issues or complaints today. 6/19; patient presents for follow-up. We have been using Hydrofera Blue to the left foot wound under the total contact cast and Aquacel Ag to the right wound. She has no issues or complaints today. The wounds are smaller. [name], [name] ([id number]) [id number] Page 8 of 20 6/26; patient presents for follow-up. We have been using Hydrofera Blue to the left foot wound under the total contact cast and Aquacel Ag to the right foot wound. Wounds are smaller. Patient has no issues or complaints today. 7/3; patient presents for follow-up. We have been using Hydrofera Blue to the left foot wound under the total contact cast and Aquacel Ag to the right foot wound. Wounds Appear smaller. No signs of infection. 7/10; patient presents for follow-up. We have been using Hydrofera Blue to the left foot wound under the total contact cast and Aquacel Ag to the right foot wound. Left foot wound is smaller. Right foot wound is stable. 7/17; patient presents for follow-up. We  have been using Hydrofera Blue to the left foot under the total contact cast and Aquacel Ag to the right foot wound. She has no issues or complaints today. 7/24; patient presents for follow-up. We have been using Hydrofera Blue to the left foot under the total contact cast and Aquacel Ag to the right foot wound. Unfortunately she was bit by her dog over the weekend and developed [name] wound to the right lower leg. She has been keeping the area covered. 7/31; patient presents for follow-up. We have been using Hydrofera Blue to the left foot under the total contact cast and Aquacel Ag to the right foot wound and mupirocin ointment to the right anterior leg wound. She started her oral antibiotics 2 days ago. 8/7; patient presents for follow-up. We have been using Hydrofera Blue and antibiotic ointment to the left foot under the total contact cast and Aquacel Ag to the right foot wound. Antibiotic ointment to the right anterior leg wound. She completed her oral antibiotics. She has no issues or complaints today. Wounds continue to improve albeit slowly. 8/14; 8/14; patient has 3 wounds 1 on the left plantar heel 1 on the right plantar heel and [name] small wound on the right anterior lower leg. We have been using Hydrofera Blue in the left heel Aquacel on the right heel. The left heel has been in [name] total contact cast 8/21; patient presents for follow-up. We have been using Hydrofera Blue to the left foot wound under the total contact cast. T the right heel patient has been o using Aquacel Ag. T the anterior leg wound antibiotic ointment. All wounds have healed. o 8/28; patient presents for follow-up. She has been using her surgical shoe with offloading felt pads. Her wounds have remained closed. Electronic Signature(s) Signed: 06/21/2023 2:40:32 PM By: Geralyn Corwin DO Entered By: Geralyn Corwin on 06/21/2023  09:49:11 -------------------------------------------------------------------------------- Physical Exam Details Patient Name: Date of Service: [name], [name] 06/21/2023 12:15 PM Medical Record Number: [id number] Patient Account Number: [id number] Date of Birth/Sex: Treating RN: 03/02/1976 (47 y.o. [name] Primary Care Provider: [name] Other Clinician: [name] Referring Provider: Treating Provider/Extender: Chapman Moss in Treatment: 83 Constitutional . Cardiovascular . Psychiatric . Notes Epithelization to the previous wound sites on the left foot, right  foot and right leg. No signs of infection. No drainage on palpation to the previous wound sites. Electronic Signature(s) Signed: 06/21/2023 2:40:32 PM By: Geralyn Corwin DO Entered By: Geralyn Corwin on 06/21/2023 09:49:45 [name] ([id number]) [id number] Page 9 of 20 -------------------------------------------------------------------------------- Physician Orders Details Patient Name: Date of Service: [name], [name] 06/21/2023 12:15 PM Medical Record Number: [id number] Patient Account Number: [id number] Date of Birth/Sex: Treating RN: 06/11/1976 (47 y.o. [name] Primary Care Provider: [name] Other Clinician: [name] Referring Provider: Treating Provider/Extender: Chapman Moss in Treatment: 49 Verbal / Phone Orders: Yes Clinician: Yevonne Pax Read Back and Verified: Yes Diagnosis Coding Follow-up Appointments Return Appointment in 2 weeks. Bathing/ Shower/ Hygiene May shower; gently cleanse wound with antibacterial soap, rinse and pat dry prior to dressing wounds No tub bath. Off-Loading Open toe surgical shoe - right, left Offloading felt to foot. - left foot Other: - felt offloading pad to left foot Electronic Signature(s) Signed: 06/21/2023 2:40:32 PM By: Geralyn Corwin  DO Entered By: Geralyn Corwin on 06/21/2023 09:51:18 -------------------------------------------------------------------------------- Problem List Details Patient Name: Date of Service: Heather Jefferson [name]. 06/21/2023 12:15 PM Medical Record Number: [id number] Patient Account Number: [id number] Date of Birth/Sex: Treating RN: [birth year] (47 y.o. 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 03/16/2022 No Yes [name], CULL [name] ([id number]) [id number] Page 10 of 20 E11.621 Type 2 diabetes mellitus with foot ulcer 03/16/2022 No Yes L97.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 03/16/2022 No Yes of skin W54.0XXA Bitten by dog, initial encounter 05/17/2023 No Yes S81.801A Unspecified open wound, right lower leg, initial encounter 05/17/2023 No Yes E11.622 Type 2 diabetes mellitus with other skin ulcer 05/17/2023 No Yes E11.42 Type 2 diabetes mellitus with diabetic polyneuropathy 03/16/2022 No Yes Z79.4 Long term (current) use of insulin 02/22/2023 No Yes Inactive Problems Resolved Problems Electronic Signature(s) Signed: 06/21/2023 2:40:32 PM By: Geralyn Corwin DO Entered By: Geralyn Corwin on 06/21/2023 09:47:43 -------------------------------------------------------------------------------- Progress Note Details Patient Name: Date of Service: Heather Jefferson [name]. 06/21/2023 12:15 PM Medical Record Number: [id number] Patient Account Number: [id number] Date of Birth/Sex: Treating RN: 26-Oct-1975 (47 y.o. [name] Primary Care Provider: [name] Other Clinician: [name] Referring Provider: Treating Provider/Extender: Chapman Moss in Treatment: 62 Subjective Chief Complaint Information obtained from Patient 03/19/2021; patient referred by Dr. Excell Seltzer who has been looking after her left foot for quite [name] period of time for review of [name] nonhealing area in the left midfoot 03/12/2022; bilateral feet wounds and right lower extremity wound. History of Present Illness (HPI) 01/18/18-She is here for initial evaluation of the left great toe ulcer. She is [name] poor historian in regards to timeframe in detail. She states approximately 4 weeks ago she lacerated her toe on something in the house. She followed up with her primary care who placed her on Bactrim and ultimately [name] second dose of Bactrim prior to coming to wound clinic. She states she has been treating the toe with peroxide, Betadine and [name] Band-Aid. She did not check her blood sugar this morning but checked it yesterday morning it was 327;  she is unaware of [name] recent A1c and there are no current records. She saw Dr. she would've orthopedics last week for an old injury to the left ankle, she states he did not see her toe, nor did she bring it to his attention. She smokes approximately 1 pack cigarettes [name], [name] ([id number]) [id number] Page 11 of 20 [name] day. Her social situation is concerning, she arrives this morning with her mother who appears extremely intoxicated/under the influence; her mother was asked to leave the room and be monitored by the patient's grandmother. The patient's aunt then accompanied the patient and the room throughout the rest of the appointment. We had [name] lengthy discussion regarding the deleterious effects of uncontrolled hyperglycemia and smoking as it relates to wound healing and overall health. She was strongly encouraged to decrease her smoking and get her  diabetes under better control. She states she is currently on [name] diet and has cut down her [facility] consumption. The left toe is erythematous, macerated and slightly edematous with malodor present. The edema in her left foot is below her baseline, there is no erythema streaking. We will treat her with Santyl, doxycycline; we have ordered and xray, culture and provided [name] Peg assist surgical shoe and cultured the wound. 01/25/18-She is here in follow-up evaluation for [name] left great toe ulcer and presents with an abscess to her suprapubic area. She states her blood sugars remain elevated, feeling "sick" and if levels are below 250, but she is trying. She has made no attempt to decrease her smoking stating that we "can't take away her food in her cigarettes". She has been compliant with offloading using the PEG assist you. She is using Santyl daily. the culture obtained last week grew staph aureus and Enterococcus faecalis; continues on the doxycycline and Augmentin was added on Monday. The suprapubic area has erythema, no femoral variation, purple discoloration, minimal induration, was accessed with [name] cotton tip applicator with sanguinopurulent drainage, this was cultured, I suspect the current antibiotic treatment will cover and we will not add anything to her current treatment plan. She was advised to go to urgent care or ER with any change in redness, induration or fever. 02/01/18-She is here in follow-up evaluation for left great toe ulcers and [name] new abdominal abscess from last week. She was able to use packing until earlier this week, where she "forgot it was there". She states she was feeling ill with GI symptoms last week and was not taking her antibiotic. She states her glucose levels have been predominantly less than 200, with occasional levels between 200-250. She thinks this was contributing to her GI symptoms as they have resolved without intervention. There continues to be significant  laceration to left toe, otherwise it clinically looks stable/improved. There is now less superficial opening to the lateral aspect of the great toe that was residual blister. We will transition to [facility] to all wounds, she will continue her Augmentin. If there is no change or deterioration next week for reculture. 02/08/18-She is here in follow-up evaluation for left great toe ulcer and abdominal ulcer. There is an improvement in both wounds. She has been wrapping her left toe with coban, not by our direction, which has created an area of discoloration to the medial aspect; she has been advised to NOT use coban secondary to her neuropathy. She states her glucose levels have been high over this last week ranging from 200-350, she continues to smoke. She admits to being less compliant with  her offloading shoe. We will continue with same treatment plan and she will follow-up next week. 02/15/18-She is here in follow-up evaluation for left great toe ulcer and abdominal ulcer. The abdominal ulcer is epithelialized. The left great toe ulcer is improved and all injury from last week using the Coban wrap is resolved, the lateral ulcer is healed. She admits to noncompliance with wearing offloading shoe and admits to glucose levels being greater than 300 most of the week. She continues to smoke and expresses no desire to quit. There is one area medially that probes deeper than it has historically, erythema to the toe and dorsal foot has consistently waxed and waned. There is no overt signs of cellulitis or infection but we will culture the wound for any occult infection given the new area of depth and erythema. We will hold off on sensitivities for initiation of antibiotic therapy. 02/22/18-She is here in follow up evaluation for left great toe ulcer. There is overall significant improvement in both wound appearance, erythema and edema with changes made last week. She was not initiated on antibiotic therapy.  Culture obtained last week showed oxacillin sensitive staph aureus, sensitive to clindamycin. Clindamycin has been called into the pharmacy but she has been instructed to hold off on initiation secondary to overall clinical improvement and her history of antibiotic intolerance. She has been instructed to contact the clinic with any noted changes/deterioration and the wound, erythema, edema and/or pain. She will follow-up next week. She continues to smoke and her glucose levels remain elevated >250; she admits to compliance with offloading shoe 03/01/18 on evaluation today patient appears to be doing fairly well in regard to her left first toe ulcer. She has been tolerating the dressing changes with the [facility] Dressing without complication and overall this has definitely showed signs of improvement according to records as well is what the patient tells me today. I'm very pleased in that regard. She is having no pain today 03/08/18 She is here for follow up evaluation of [name] left great toe ulcer. She remains non-compliant with glucose control and smoking cessation; glucose levels consistently >200. She states that she got new shoe inserts/peg assist. She admits to compliance with offloading. Since my last evaluation there is significant improvement. We will switch to prisma at this time and she will follow up next week. She is noted to be tachycardic at this appointment, heart rate 120s; she has [name] history of heart rate 70-130 according to our records. She admits to extreme agitation r/t personal issues; she was advised to monitor her heartrate and contact her physician if it does not return to [name] more normal range (<100). She takes cardizem twice daily. 03/15/18-She is here in follow-up evaluation for left great toe ulcer. She remains noncompliant with glucose control and smoking cessation. She admits to compliance with wearing offloading shoe. The ulcer is improved/stable and we will continue with the  same treatment plan and she will follow-up next week 03/22/18-She is here for evaluation for left great toe ulcer. There continues to be significant improvement despite recurrent hyperglycemia (over 500 yesterday) and she continues to smoke. She has been compliant with offloading and we will continue with same treatment plan and she will follow-up next week. 03/29/18-She is here for evaluation for left great toe ulcer. Despite continuing to smoke and uncontrolled diabetes she continues to improve. She is compliant with offloading shoe. We will continue with the same treatment plan and she will follow-up next week 04/05/18- She is  here in follow up evaluation for [name] left great toe ulcer; she presents with small pustule to left fifth toe (resembles ant bite). She admits to compliance with wearing offloading shoe; continues to smoke or have uncontrolled blood glucose control. There is more callus than usual with evidence of bleeding; she denies known trauma. 04/12/18-She is here for evaluation of left great toe ulcer. Despite noncompliance with glycemic control and smoking she continues to make improvement. She continues to wear offloading shoe. The pustule, that was identified last week, to the left fifth toe is resolved. She will follow-up in 2 weeks 05/03/18-she is seen in follow-up evaluation for [name] left great toe ulcer. She is compliant with offloading, otherwise noncompliant with glycemic control and smoking. She has plateaued and there is minimal improvement noted. We will transition to [facility], replaced the insert to her surgical shoe and she will follow-up in one week 05/10/18- She is here in follow up evaluation for [name] left great toe ulcer. It appears stable despite measurement change. We will continue with same treatment plan and follow up next week. 05/24/18-She is seen in follow-up evaluation for [name] left great toe ulcer. She remains compliant with offloading, has made significant improvement in  her diet, decreasing the amount of sugar/soda. She said her recent A1c was 10.9 which is lower than. She did see [name] diabetic nutritionist/educator yesterday. She continues to smoke. We will continue with the same treatment plan and she'll follow-up next week. 05/31/18- She is seen in follow-up evaluation for left great toe ulcer. She continues to remain compliant with offloading, continues to make improvement in her diet, increasing her water and decreasing the amount of sugar/soda. She does continue to smoke with no desire to quit. We will apply Prisma to the depth and Hydrofera Blue over. We have not received insurance authorization for oasis. She will follow up next week. 06/07/18-She is seen in follow-up evaluation for left great toe ulcer. It has stalled according to today's measurements although base appears stable. She says she saw [name] diabetic educator yesterday; her average blood sugars are less than 300 which is an improvement for her. She continues to smoke and states "that's my next step" She continues with water over soda. We will order for xray, culture and reinstate ace wrap compression prior to placing apligraf for next week. She is voicing no complaints or concerns. Her dressing will change to iodoflex over the next week in preparation for apligraf. 06/14/18-She is seen in follow-up evaluation for left great toe ulcer. Plain film x-ray performed last week was negative for osteomyelitis. Wound culture obtained last week grew strep B and OSSA; she is initiated on keflex and cefdinir today; there is erythema to the toe which could be from ace wrap compression, she has [name] history of wrapping too tight and has has been encouraged to maintain ace wraps that we place today. We will hold off on application of apligraf today, will apply next week after antibiotic therapy has been initiated. She admits today that she has resumed taking [name] shower with her foot/toe submerged in water, she has been reminded  to keep foot/toe out of the bath water. She will be seen in follow up next week 06/21/18-she is seen in follow-up evaluation for left great toe ulcer. She is tolerating antibiotic therapy with no GI disturbance. The wound is stable. Apligraf was applied today. She has been decreasing her smoking, only had 4 cigarettes yesterday and 1 today. She continues being more compliant in diabetic  diet. She will follow-up next week for evaluation of site, if stable will remove at 2 weeks. 06/28/18- She is here in follow up evalution. Apligraf was placed last week, she states the dressing fell off on Tuesday and she was dressing with hydrofera blue. She is healed and will be discharged from the clinic today. She has been instructed to continue with smoking cessation, continue monitoring glucose levels, offloading for an additional 4 weeks and continue with hydrofera blue for additional two weeks for any possible microscopic opening. Readmission: 08/07/18 on evaluation today patient presents for reevaluation concerning the ulcer of her right great toe. She was previously discharged on 06/28/18 healed. Nonetheless she states that this began to show signs of drainage she subsequently went to her primary care provider. Subsequently an x-ray was performed on 08/01/18 which was negative. The patient was also placed on antibiotics at that time. Fortunately they should have been effective for the infection. Nonetheless she's been experiencing some improvement but still has [name] lot of drainage coming from the wound itself. 08/14/18 on evaluation today patient's wound actually does show signs of improvement in regard to the erythema at this point. She has completed the antibiotics. With that being said we did discuss the possibility of placing her in [name] total contact cast as of today although I think that I may want to give this just BRIANCA, SHIH [name] ([id number]) [id number] Page 12 of 20 [name] little bit  more time to ensure nothing recurrence as far as her infection is concerned. I do not want to put in the cast and risk infection at that time if things are not completely resolved. With that being said she is gonna require some debridement today. 08/21/18 on evaluation today patient actually appears to be doing okay in regard to her toe ulcer. She's been tolerating the dressing changes without complication. With that being said it does appear that she is ready and in fact I think it's appropriate for [place] to go ahead and initiate the total contact cast today. Nonetheless she will require some sharp debridement to prepare the wound for application. Overall I feel like things have been progressing well but we do need to do something to get this to close more readily. 08/24/18 patient seen today for reevaluation after having had the total contact cast applied on Tuesday. She seems to have done very well the wound appears to be doing great and overall I'm pleased with the progress that she's made. There were no abnormal areas of rubbing from the cast on her lower extremity. 08/30/18 on evaluation today patient actually appears to be completely healed in regard to her plantar toe ulcer. She tells me at this point she's been having [name] lot of issues with the cast. She almost fell [name] couple of times the state shall the step of her dog [name] couple times as well. This is been [name] very frustrating process for her other nonetheless she has completely healed the wound which is excellent news. Overall there does not appear to be the evidence of infection at this time which is great news. 09/11/18 evaluation today patient presents for follow-up concerning her great toe ulcer on the left which has unfortunately reopened since I last saw her which was only [name] couple of weeks ago. Unfortunately she was not able to get in to get the shoe and potentially the AFO that's gonna be necessary due to her left foot drop. She continues with  offloading shoe but this is not enough  to prevent her from reopening it appears. When we last had her in the total contact cast she did well from [name] healing standpoint but unfortunately the wound reopened as soon as she came out of the cast within just [name] couple of weeks. 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 This is also part of the reason why she falls on [name] regular basis. I just do not believe that is gonna be safe for [place] to reinitiate the total contact cast as last time we had this on she fell 3 times one week which is definitely not normal for her. 09/18/18 upon evaluation today the patient actually appears to be doing about the same in regard to her toe ulcer. She did not contact [employer] as I asked her to even though I had given her the prescription. In fact she actually states that she has no idea where the prescription is. She did apparently call [employer] and they told her that all she needed to do was bring the prescription in order to be able to be seen and work on getting the AFO for her left foot. With all that being said she still does not have an appointment and I'm not sure were things stand that regard. I will give her [name] new prescription today in order to contact them to get this set up. 09/25/18 on evaluation today patient actually appears to be doing about the same in regard to her toes ulcer. She does have [name] small areas which seems to have [name] lot of callous buildup around the edge of the wound which is going to need sharp debridement today. She still is waiting to be scheduled for evaluation with [employer] for possibility of an AFO. She states there supposed to call her tomorrow to get this set up. Unfortunately it does appear that her foot specifically the toe area is showing signs of  erythema. There does not appear to be any systemic infection which is in these good news. 10/02/18 on evaluation today patient actually appears to be doing about the same in regard to her toe ulcer. This really has not done too well although it's not significantly larger it's also not significantly smaller. She has been tolerating the dressing changes without complication. She actually has her appointment with [employer] and [place] tomorrow to hopefully be measured for obtaining and AFO splint. I think this would be helpful preventing this from reoccurring. We had contemplated starting the cast this week although to be honest I am reluctant to do that as she's been having nausea, vomiting, and seizure activity over the past three days. She has [name] history of seizures and have been told is nothing that can be done for these. With that being said I do believe that along with the seizures have the nausea vomiting which upon further questioning doesn't seem to be the normal for her and makes me concerned for the possibility of infection or something else going on. I discussed this with the patient and her mother during the office visit today. I do not feel the wound is effective but maybe something else. The responses this was "this just happens to her at times and we don't know why". They did not seem to be interested in going to the hospital to  have this checked out further. 10/09/18 on evaluation today patient presents for follow-up concerning her ongoing toe ulcer. She has been tolerating the dressing changes without complication. Fortunately there does not appear to be any evidence of infection which is great news however I do think that the patient would benefit from going ahead for with the total contact cast. She's actually in [name] wheelchair today she tells me that she will use her walker if we initiate the cast. I was very specific about the fact that if we were gonna do the cast I wanted to make sure  that she was using the walker in order to prevent any falls. She tells me she does not have stairs that she has to traverse on [name] regular basis at her home. She has not had any seizures since last week again that something that happens to her often she tells me she did talk to [employer] and they said that it may take up to three weeks to get the brace approved for her. Hopefully that will not take that long but nonetheless in the meantime I do think the cast could be of benefit. 10/12/18 on evaluation today patient appears to be doing rather well in regard to her toe ulcer. It's just been [name] few days and already this is significantly improved both as far as overall appearance and size. Fortunately there's no sign of infection. She is here for her first obligatory cast change. 10/19/18 Seen today for follow up and management of left great toe ulcer. Wound continues to show improvement. Noted small open area with seroussang drainage with palpation. Denies any increased pain or recent fevers during visit. She will continue calcium alginate with offloading shoe. Denies any questions or concerns during visit. 10/26/18 on evaluation today patient appears to be doing about the same as when I last saw her in regard to her wound bed. Fortunately there does not appear to be any signs of infection. Unfortunately she continues to have [name] breakdown in regard to the toe region any time that she is not in the cast. It takes almost no time at all for this to happen. Nonetheless she still has not heard anything from the brace being made by [employer] as to when exactly this will be available to her. Fortunately there is no signs of infection at this time. 10/30/18 on evaluation today patient presents for application of the total contact cast as we just received him this morning. Fortunately we are gonna be able to apply this to her today which is great news. She continues to have no significant pain which is good news. Overall I  do feel like things have been improving while she was the cast is when she doesn't have [name] cast that things get worse. She still has not really heard anything from [employer] regarding her brace. 11/02/18 upon evaluation today patient's wound already appears to be doing significantly better which is good news. Fortunately there does not appear to be any signs of infection also good news. Overall I do think the total contact cast as before is helping to heal this area unfortunately it's just not gonna likely keep the area closed and healed without her getting her brace at least. Again the foot drop is [name] significant issue for her. 11/09/18 on evaluation today patient appears to be doing excellent in regard to her toe ulcer which in fact is completely healed. Fortunately we finally got the situation squared away with the paperwork which was needed to  proceed with getting her brace approved by Medicaid. I have filled that out unfortunately that information has been sent to the orthopedic office that I worked at 2 1/2 years ago and not tired Current wound care measures. Fortunately she seems to be doing very well at this time. 11/23/18 on evaluation today patient appears to be doing More Poorly Compared to Last Time I Saw Her. At [facility] She Had Completely Healed. Currently she is continuing to have issues with reopening. She states that she just found out that the brace was approved through Medicaid now she just has to go get measured in order to have this fitted for her and then made. Subsequently she does not have an appointment for this yet that is going to complicate things we obviously cannot put her back in the cast if we do not have everything measured because they're not gonna be able to measure her foot while she is in the cast. 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 Obviously this is unfortunate  and does have me somewhat worried as well. 11/30/18 on evaluation today patient's toe ulcer actually appears to be doing fairly well. The good news is she will be getting her brace in the shoes next week on Wednesday. Hopefully we will be able to get this to heal without having to go back in the cast however she may need the cast in order to get the wound completely heal and then go from there. Fortunately there's no signs of infection at this time. 12/07/18 on evaluation today patient fortunately did receive her brace and she states she could tell this definitely makes her walk better. With that being said she's been having issues with her toe where she noticed yesterday there was [name] lot of tissue that was loosing off this appears to be much larger than what it was previous. She also states that her leg has been read putting much across the top of her foot just about the ankle although this seems to be receiving [name], [name] ([id number]) [id number] Page 13 of 20 somewhat. The total area is still red and appears to be someone infected as best I can tell. She is previously taken Bactrim and that may be [name] good option for her today as well. We are gonna see what I wound culture shows as well and I think that this is definitely appropriate. With that being said outside of the culture I still need to initiate something in the interim and that's what I'm gonna go ahead and select Bactrim is [name] good option for her. 12/14/18 on evaluation today patient appears to be doing better in regard to her left great toe ulcer as compared to last week's evaluation. There's still some erythema although this is significantly improved which is excellent news. Overall I do believe that she is making good progress is still gonna take some time before she is where I would like her to be from the standpoint of being able to place her back into the total contact cast. Hopefully we will be where we need  to be by next week. 12/21/18 on evaluation today patient actually appears to be doing poorly in regard to her toe ulcer. She's been tolerating the dressing changes without complication. Fortunately there's no signs of systemic infection although she does have [name] lot of drainage from the toe ulcer and this does  seem to be causing some issues at this point. She does have erythema on the distal portion of her toe that appears to be likely cellulitis. 12/28/18 on evaluation today patient actually appears to be doing [name] little better in my pinion in regard to her toe ulcer. With that being said she still does have some evidence of infection at this time and for her culture she had both E. coli as well as enterococcus as organisms noted on evaluation. For that reason I think that though the Keflex likely has treated the E. coli rather well this has really done nothing for the enterococcus. We are going to have to initiate treatment for this specifically. 01/04/19 on evaluation today patient's toe actually appears to be doing better from the standpoint of infection. She currently would like to see about putting the cash back on I think that this is appropriate as long as she takes care of it and keeps it from getting wet. She is gonna have some drainage we can definitely pass this up with Drawtex and alginate to try to prevent as much drainage as possible from causing the problems. With that being said I do want to at least try her with the cast between now and Tuesday. If there any issues we can't continue to use it then I will discontinue the use of the cast at that point. 01/08/19 on evaluation today patient actually appears to be doing very well as far as her foot ulcer specifically the great toe on the left is concerned. She did have an area of rubbing on the medial aspect of her left ankle which again is from the cast. Fortunately there's no signs of infection at this point in this appears to be [name] very slight  skin breakdown. The patient tells me she felt it rubbing but didn't think it was that bad. Fortunately there is no signs of active infection at this time which is good news. No fevers, chills, nausea, or vomiting noted at this time. 01/15/19 on evaluation today patient actually appears to be doing well in regard to her toe ulcer. Again as previous she seems to do well and she has the cast on which indicates to me that during the time she doesn't have [name] cast on she's putting way too much pressure on this region. Obviously I think that's gonna be an issue as with the current national emergency concerning the Covid-19 Virus it has been recommended that we discontinue the use of total contact casting by the chief medical officer of our company, Dr. Maurine Minister. The reasoning is that if [name] patient becomes sick and cannot come into have the cast removed they could not just leave this on for an additional two weeks. Obviously the hospitals also do not want to receive patient's who are sick into the emergency department to potentially contaminate the region and spread the Covid-19 Virus among other sick individuals within the hospital system. Therefore at this point we are suspending the use of total contact cast until the current emergency subsides. This was all discussed with the patient today as well. 01/22/19 on evaluation today patient's wound on her left great toe appears to be doing slightly worse than previously noted last week. She tells me that she has been on this quite [name] bit in fact she tells me she's been awake for 38 straight hours. This is due to the fact that she's having to care for grandparents because nobody else will. She has been taking care of  them for five the last seven days since I've seen her they both have dementia his is from [name] stroke and her grandmother's was progressive. Nonetheless she states even her mom who knows her condition and situation has only help two of those days to take care  of them she's been taking care of the rest. Fortunately there does not appear to be any signs of active infection in regard to her toe at this point although obviously it doesn't look as good as it did previous. I think this is directly related to her not taking off the pressure and friction by way of taking things easy. Though I completely understand what's going on. 01/29/19 on evaluation today patient's tools are actually appears to be showing some signs of improvement today compared to last week's evaluation as far as not necessarily the overall size of the wound but the fact that she has some new skin growth in between the two ends of the wound opening. Overall I feel like she has done well she states that she had [name] family member give her what sounds to be [name] CAM walker boot which has been helpful as well. 02/05/19 on evaluation today patient's wound bed actually appears to be doing significantly better in regard to her overall appearance of the size of the wound. With that being said she is still having an issue with offloading efficiently enough to get this to close. Apparently there is some signs of infection at this point as well unfortunately. Previously she's done well of Augmentin I really do not see anything that needs to be culture currently but there theme and cellulitis of the foot that I'm seeing I'm gonna go ahead and place her on an antibiotic today to try to help clear this up. 02/12/2019 on evaluation today patient actually appears to be doing poorly in regard to her overall wound status. She tells me she has been using her offloading shoe but actually comes in today wearing her tennis shoe with the AFO brace. Again as I previously discussed with her this is really not sufficient to allow the area to heal appropriately. Nonetheless she continues to be somewhat noncompliant and I do wonder based on what she has told my nurse in the past as to whether or not [name] good portion of this  noncompliance may be recreational drug and alcohol related. She has had [name] history of heroin overdose and this was fairly recently in the past couple of months that have been seeing her. Nonetheless overall I feel like her wound looks significantly worse today compared to what it was previous. She still has significant erythema despite the Augmentin I am not sure that this is an appropriate medication for her infection I am also concerned that the infection may have gone down into her bone. 02/19/19 on evaluation today patient actually appears to be doing about the same in regard to her toe ulcer. Unfortunately she continues to show signs of bone exposure and infection at this point. There does not appear to be any evidence of worsening of the infection but I'm also not really sure that it's getting significantly better. She is on the Augmentin which should be sufficient for the Staphylococcus aureus infection that she has at this point. With that being said she may need IV antibiotics to more appropriately treat this. We did have [name] discussion today about hyperbaric option therapy. 02/28/19 on evaluation today patient actually appears to be doing much worse in regard to the wound  on her left great toe as compared to even my previous evaluation last week. Unfortunately this seems to be training in [name] pretty poor direction. Her toe was actually now starting to angle laterally and I can actually see the entire joint area of the proximal portion of the digit where is the distal portion of the digit again is no longer even in contact with the joint line. Unfortunately there's [name] lot more necrotic tissue around the edge and the toe appears to be showing signs of becoming gangrenous in my pinion. I'm very concerned about were things stand at this point. She did see infectious disease and they are planning to send in [name] prescription for Sivextro for her and apparently this has been approved. With that being said I  don't think she should avoid taking this but at the same time I'm not sure that it's gonna be sufficient to save her toe at this point. She tells me that she still having to care for grandparents which I think is putting quite [name] bit of strain on her foot and specifically the total area and has caused this to break down even to [name] greater degree than would've otherwise been expected. 03/05/19 on evaluation today patient actually appears to be doing quite well in regard to her toe all things considering. She still has bone exposed but there appears to be much less your thing on overall the appearance of the wound and the toe itself is dramatically improved. She still does have some issues currently obviously with infection she did see vascular as well and there concerned that her blood flow to the toad. For that reason they are setting up for an angiogram next week. 03/14/19 on evaluation today patient appears to be doing very poor in regard to her toe and specifically in regard to the ulceration and the fact that she's starting to notice the toe was leaning even more towards the lateral aspect and the complete joint is visible on the proximal aspect of the joint. Nonetheless she's also noted [name] significant odor and the tip of the toe is turning more dark and necrotic appearing. Overall I think she is getting worse not better as far as this is concerned. For that reason I am recommending at this point that she likely needs to be seen for likely amputation. READMISSION 03/19/2021 This is [name] patient that we cared for in this clinic for [name] prolonged period of time in 2019 and 2020 with [name] left foot and left first toe wound. 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 Excell Seltzer. In December 2021 she had an ulcer on her right great toe as well as the fourth and fifth toes. She underwent [name] partial ray  amputation of the right fourth and fifth toes. She also had an angiogram at that time and underwent angioplasty of the right anterior tibial artery. In any case she claims that the wound on the right LARINA, VANDENBURGH [name] ([id number]) [id number] Page 14 of 20 foot is closed I did not look at this today which was probably an oversight although I think that should be done next week. 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 According to Dr. Bernette Redbird last review that she was out of the area being cared for by another physician but recently came back to his attention. 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 [name] culture of this area showed E. coli apparently before she came back to see Dr. Excell Seltzer she was supposed to be receiving antibiotics but she did not really take them. Nor is she offloading this area at all. Finally her last hemoglobin A1c listed in epic was in March 2022 at 14.1 she says things are [name] lot better since then although I am not sure. 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 She had this wound at the time. During this admission she had E. coli sepsis [name] TEE was negative. 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 She is still [name] heavy cigarette smoker. She has [name] history of bipolar disorder seizure disorder ABI in our clinic was 1.05 6/1; left midfoot in the setting of [name] TMA done previously. Round circular wound with [name] "knuckle" of protruding tissue. The problem is that the knuckle was not attached to any of the surrounding granulation and this probed proximally widely I  removed [name] large portion of this tissue. This wound goes with considerable undermining laterally. I do not feel any bone there was no purulence but this is [name] deep wound. 6/8; in spite of the debridement I did last week. She arrives with [name] wound looking exactly the same. [name] protruding "knuckle" of tissue nonadherent to most of the surrounding tissue. There is considerable depth around this from 6-12 o'clock at 2.7 cm and undermining of 1 cm. This does not look overtly infected and the x- ray I did last week was negative for any osseous abnormalities. We have been using silver collagen 6/15; deep tissue culture I did last week showed moderate staph aureus and moderate Pseudomonas. This will definitely require prolonged antibiotic therapy. The pathology on the protuberant area was negative for malignancy fungus etc. 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 We have been using silver collagen. I am going to be prescribing Levaquin for 2 weeks. Her CT scan of the foot is down for 7/5 6/22; CT scan of the foot on 7 5. She says she has hardware in the left leg from her previous fracture. She is on the Levaquin for the deep tissue culture I did that showed methicillin sensitive staph aureus and Pseudomonas. I gave her [name] 2-week supply and she will have another week. She arrives in clinic today with the same protuberant tissue however this is nonadherent to the tissue surrounding it. I am really at [name] loss to explain this unless there is underlying deep tissue infection 6/29; patient presents for 1 week follow-up. She has been using collagen to the wound bed. She reports taking her antibiotics as prescribed.She has no complaints or issues today. She denies signs of infection. 7/6; patient presents for one week followup. She has been using collagen to the wound bed. She states she is taking Levaquin however at times she is not able to keep it down. She  denies signs of infection. 7/13; patient presents for 1 week follow-up. She has been using silver alginate to the wound bed. She still has nausea when taking her antibiotics. She denies signs of infection. 7/20; patient presents for 1 week follow-up. She has been using  silver alginate with gentamicin cream to the wound bed. She denies any issues and has no complaints today. She denies signs of infection. 7/27; patient presents for 1 week follow-up. She continues to use silver alginate with gentamicin cream to the wound bed. She reports starting her antibiotics. She has no issues or complaints. Overall she reports stability to the wound. 8/3; patient presents for 1 week follow-up. She has been using silver alginate with gentamicin cream to the wound bed. She reports completing all antibiotics. She has no issues or complaints today. She denies signs of infection. 8/17; patient presents for 2-week follow-up. He is to use silver alginate to the wound bed. She has no issues or complaints today. She denies signs of infection. She reports her pain has improved in her foot since last clinic visit 8/24; patient presents for 1 week follow-up. She continues to use silver alginate to the wound bed. She has no issues or complaints. She denies signs of infection. Pain is stable. 9/7; patient presents for follow-up. She missed her last week appointment due to feeling ill. She continues to use silver alginate. She has [name] new wound to the right lower extremity that is covered in eschar. She states It occurred over the past week and has no idea how it started. She currently denies signs of infection. 9/14; patient presents for follow-up. T the left foot wound she has been using gentamicin cream and silver alginate. T the right lower extremity wound she has o o been keeping this covered and has not obtain Santyl. 9/21; patient presents for follow-up. She reports using gentamicin cream and silver alginate to the left  foot and Santyl to the right lower extremity wound. She has no issues or complaints today. She denies signs of infection. 9/28; patient presents for follow-up. She reports [name] new wound to her right heel. She states this occurred [name] few days ago and is progressively gotten worse. She has been trying to clean the area with [name] Q-tip and Santyl. She reports stability in the other 2 wounds. She has been using gentamicin cream and silver alginate to the left foot and Santyl to the right lower extremity wound. 10/12; patient presents for follow-up. She reports improvement to the wound beds. She is seeing vein and vascular to discuss the potential of [name] left BKA. She states they are going to do an arteriogram. She continues to use silver alginate with dressing changes to her wounds. 11/2; patient presents for follow-up. She states she has not been doing dressing changes to the wound beds. She states she is not able to offload the areas. She reports chronic pain to her left foot wound. 11/9; patient presents for follow-up. She came in with only socks on. She states she forgot to put on shoes. It is unclear if she is doing any dressing changes. She currently denies systemic signs of infection. 11/16; patient presents for follow-up. She came again only with socks on. She states she does not wear shoes ever. It is unclear if she does dressing changes. She currently denies systemic signs of infection. 11/23; patient presents for follow-up. She wore her shoes today. It still unclear exactly what dressing she is using for each wound but she did states she obtained Dakin's solution and has been using this to the left foot wound. She currently denies signs of infection. 11/30; patient presents for follow-up. She has no issues or complaints today. She currently denies signs of infection. 12/7; patient presents for follow-up. She has no  issues or complaints today. She has been using Hydrofera Blue to the right heel wound  and Dakin solution to the left foot wound. Her right anterior leg wound is healed. She currently denies signs of infection. 12/14; patient presents for follow-up. She has been using Hydrofera Blue to the right heel and Dakin's to the left foot wounds. She has no issues or complaints today. She denies signs of infection. 12/21; patient presents for follow-up. She reports using Hydrofera Blue to the right heel and Dakin's to the left foot wound. She denies signs of infection. 12/28; patient presents for follow-up. She continues to use Dakin's to the left foot wound and Hydrofera Blue to the right heel wound. She denies signs of infection. 1/4; patient presents for follow-up. She has no issues or complaints today. She denies signs of infection. 1/11; patient presents for follow-up. It is unclear if she has been dressing these wounds over the past week. She currently denies signs of infection. 1/18; patient presents for follow-up. She states she has been using Dakin's wet-to-dry dressings to the left foot. She has been using Hydrofera Blue to the right foot foot wound. She states that the anterior right leg wound has reopened and draining serous fluid. She denies signs of infection. 1/25; patient presents for follow-up. She has no issues or complaints today. MORRIAH, GLAAB [name] ([id number]) [id number] Page 15 of 20 2/1; patient presents for follow-up. She has no issues or complaints today. She denies signs of infection. 2/8; patient presents for follow-up. She has lost her surgical shoes. She did not have [name] dressing to the right heel wound. She currently denies signs of infection. 2/15; patient presents for follow-up. She reports more pain to the right heel today. She denies purulent drainage Or fever/chills 2/22; patient presents for follow-up. She reports taking clindamycin over the past week. She states that she continues to have pain to her right heel. She reports purulent  drainage. Readmission 03/16/2022 Ms. Ajuni Pagani is [name] 47 year old female with [name] past medical history of type 2 diabetes, osteomyelitis to her feet, chronic systolic heart failure and bipolar disorder that presents to the clinic for bilateral feet wounds and right lower extremity wound. She was last seen in our clinic on 12/15/2021. At that time she had purulent drainage coming out of her right plantar foot and I recommended she go to the ED. She states she went to [facility] and has been there for the past 3 months. I cannot see the records. She states she had OR debridement and was on several weeks of IV antibiotics while inpatient. Since discharge she has not been taking care of the wound beds. She had nothing on her feet other than socks today. She currently denies signs of infection. 5/31; patient presents for follow-up. She has been using Dakin's wet-to-dry dressings to the wound beds on her feet bilaterally and antibiotic ointment to the right anterior leg wound. She had [name] wound culture done at last clinic visit that showed moderate Pseudomonas aeruginosa sensitive to ciprofloxacin. She currently denies systemic signs of infection. 6/14; patient presents for follow-up. She received Keystone 5 days ago and has been using this on the wound beds. She states that last week she had to go to the hospital because she had increased warmth and erythema to the right foot. She was started on 2 oral antibiotics. She states she has been taking these. She currently denies systemic signs of infection. She has no issues or complaints today. 6/21; patient presents  for follow-up. She states she has been using Keystone antibiotics to the wound beds. She has no issues or complaints today. She denies signs of infection. 6/28; patient presents for follow-up. She has been using Keystone antibiotics to the wound beds. She has no issues or complaints today. 7/12; patient presents for follow-up. Has been  using Keystone antibiotics to the wound beds with calcium alginate. She has no issues or complaints today. She never followed up with her orthopedic surgeon who did the OR debridement to the right foot. We discussed the total contact cast for the left foot and patient would like to do this next week. 7/19; patient presents for follow-up. She has been using Keystone antibiotics with calcium alginate to the wound beds. She has no issues or complaints today. Patient is in agreement to do the total contact cast of the left foot today. She knows to return later this week for the obligatory cast change. 05-13-2022 upon evaluation today patient's wound which she has the cast of the left leg actually appears to be doing significantly better. Fortunately I do not see any signs of active infection locally or systemically which is great news and overall I am extremely pleased with where we stand currently. 7/26; patient presents for follow-up. She has [name] cast in place for the past week. She states it irritated her shin. Other than that she tolerated the cast well. She states she would like [name] break for 1 week from the cast. We have been using Keystone antibiotic and Aquacel to both wound beds. She denies signs of infection. 8/2; patient presents for follow-up. She has been using Keystone and Aquacel to the wound beds. She denies any issues and has no complaints. She is agreeable to have the cast placed today for the left leg. 06-03-2022 upon evaluation today patient appears to be doing well with regard to her wound she saw some signs of improvement which is great news. Fortunately I do not see any evidence of active infection locally or systemically at this time which is great news. No fevers, chills, nausea, vomiting, or diarrhea. 8/16; patient presents for follow-up. She has no issues or complaints today. We have been using Keystone and Aquacel to the wound beds. The left lower extremity is in [name] total contact  cast. She is tolerated this well. 8/23; patient presents for follow-up. She has had the total contact cast on the left leg for the past week. Unfortunately this has rubbed and broken down the skin to the medial foot. She currently denies signs of infection. She has been using Keystone antibiotic to the right plantar foot wound. 8/30; patient presents for follow-up. We have held off on the total contact cast for the left leg for the past week. Her wound on the left foot has improved and the previous surrounding breakdown of skin has epithelialized. She has been using Keystone antibiotic to both wound beds. She has no issues or complaints today. She denies signs of infection. 9/6; patient presents for follow-up. She has ordered her's Keystone antibiotic and this is arriving this week. She has been doing Dakin's wet-to-dry dressings to the wound beds. She denies signs of infection. She is agreeable to the total contact cast today. 9/13; patient presents for follow-up. She states that the cast caused her left leg shin to be sore. She would like to take [name] break from the cast this week. She has been using Keystone antibiotic to the right plantar foot wound. She denies signs of infection. 9/20;  patient presents for follow-up. She has been using Keystone antibiotics to the wound beds with calcium alginate to the right foot wound and Hydrofera Blue to the left foot wound. She is agreeable to having the cast placed today. She has been approved for Apligraf and we will order this for next clinic visit. 9/27; patient presents for follow-up. We have been using Keystone antibiotic with Hydrofera Blue to the left foot wound under [name] total contact cast. T the right o foot wound she has been using Keystone antibiotic and calcium alginate. She declines [name] total contact cast today. Apligraf is available for placement and she would like to proceed with this. 07-28-2022 upon evaluation today patient appears to be doing well  currently in regard to her wound. She is actually showing signs of significant improvement which is great news. Fortunately I do not see any evidence of active infection locally nor systemically at this time. She has been seeing Dr. Mikey Bussing and to be honest has been doing very well with the cast. Subsequently she comes in today with [name] cast on and we did reapply that today as well. She did not really want to she try to talk me out of that but I explained that if she wanted to heal this is really the right way to go. Patient voiced understanding. In regard to her right foot this is actually [name] lot better compared to the last time I saw her which is also great news. 10/11; patient presents for follow-up. Apligraf and the total contact cast was placed to the left leg at last clinic visit. She states that her right foot wound had burning pain to it with the placement of Apligraf to this area. She has been doing Cheriton over this area. She denies signs of infection including increased warmth, erythema or purulent drainage. 11/1; 3-week follow-up. The patient fortunately did not have [name] total contact cast or an Apligraf and on the left foot. She has been using Keystone ABD pads and kerlix and her own running shoes She arrives in clinic today with thick callus and [name] very poor surface on the left foot on the right nonviable skin subcutaneous tissue and [name] deep probing hole. 11/15; patient missed her last clinic appointment. She states she has not been dressing the wound beds for the past 2 weeks. She states that at she had [name] new roommate but is now going back to live with her mother. Apparently its been [name] distracting 2 weeks. Patient currently denies signs of infection. [name], [name] ([id number]) [id number] Page 16 of 20 11/22; patient presents for follow-up. She states she has been using Keystone antibiotic and Dakin's wet-to-dry dressings to the wound beds. She is agreeable for  cast placement today. We had ordered Apligraf however this has not been received by our facility. 11/29; Patient had [name] total contact cast placed at last clinic visit and she tolerated this well. We were using silver alginate under the cast. Patient's been using Keystone antibiotic with Aquacel to the right plantar foot wound. She has no issues or complaints today. Apligraf is available for placement today. Patient would like to proceed with this. 12/6; patient presents for follow-up. She had Apligraf placed in standard fashion last clinic visit under the total contact cast to the left lower extremity. She has been using Keystone antibiotic and Aquacel to the right plantar foot wound. She has no issues or complaints today. 12/13; patient presents for follow-up. She has finished 5 Apligraf placements. Was told she  would not qualify for more. We have been doing [name] total contact cast to the left lower extremity. She has been using Keystone antibiotic and Aquacel to the right plantar foot wound. She has no issues or complaints today. 12/20; patient presents for follow-up. We have been using Hydrofera Blue with Keystone antibiotic under [name] total contact cast of the left lower extremity. She reports using Keystone antibiotic and silver alginate to the right heel wound. She has no issues or complaints today. 12/27; patient presents with [name] healthy wound on the left midfoot. We have Apligraf to apply that to that more also using [name] total contact cast. On the right we are using Keystone and silver alginate. She is offloading the right heel with [name] surgical shoealthough by her admission she is on her feet quite [name] bit 1/3; patient presents for follow-up. Apligraf was placed to the wound beds last clinic visit. She was placed in [name] total contact cast to the left lower extremity. She declines [name] total contact cast today. She states that her mother is in the hospital and she cannot adequately get around with the cast  on. 1/10; patient presents for follow-up. She declined the total contact cast at last clinic visit. Both wounds have declined in appearance. She states that she has been on her feet and not offloading the wound beds. She currently denies signs of infection. 1/17; patient presents for follow-up. She had the total contact cast along with Apligraf placed last week to the left lower extremity. She tolerated this well. She has been using Aquacel Ag and Keystone antibiotic to the right heel wound. She currently denies signs of infection. She has no issues or complaints today. 1/24; patient presents for follow-up. We have been using Apligraf to the left foot wound along with [name] total contact cast. She has done well with this. T the right o heel wound she has been using Aquacel Ag and Keystone antibiotic ointment. She has no issues or complaints today. She denies signs of infection. 1/31; patient presents for follow-up. We have been using Apligraf to the left foot wound along with the total contact cast. She continues to do well with this. To the right heel we have been using Aquacel Ag and Keystone antibiotic ointment. She has no issues or complaints today. 12-01-2022 upon evaluation patient is seen today on my schedule due to the fact that she unfortunately was in the hospital yesterday. Her cast needed to come off the only reason she is out of the hospital is due to the fact that they would not take it off at the hospital which is somewhat bewildered reading to me to be perfectly honest. I am not certain why this was but either way she was released and then was placed on my schedule today in order to get this off and reapply the total contact casting as appropriate. I do not have an Apligraf for her today it was applied last week and today's actually expired yesterday as there was some scheduling conflicts with her being in the hospital. Nonetheless we do not have that for reapplication today but the good news  that she is not draining too much and the Apligraf can go for up to 2 weeks so I am going to go ahead and reapply the total contact casting but we are going to leave the Apligraf in place. 2/14; patient presents for follow-up. T the left leg she has had the total contact cast and Apligraf for the past week. She  has had no issues with this. T the o o right heel she has been using antibiotic ointment and Aquacel Ag. 2/21; patient presents for follow-up. We have been using Apligraf and [name] total contact cast to the left lower extremity. She is tolerated this well. Unfortunately she is not approved for any more Apligraf per insurance. She has been using antibiotic ointment and Aquacel Ag to the right foot. She has no issues or complaints today. 2/28; patient presents for follow-up. We have been using Hydrofera Blue and antibiotic ointment under the total contact cast to the left lower extremity. He has been using Aquacel Ag with antibiotic ointment to the right plantar foot. She has no issues or complaints today. 3/6; patient presents for follow-up. She did not obtain her gentamicin ointment. She has been using Aquacel Ag to the right plantar foot wound. We have been using Hydrofera Blue with antibiotic ointment under the total contact cast to the left lower extremity. She has no issues or complaints today. 3/12; patient presents for follow-up. She has been using gentamicin ointment and Aquacel Ag to the right plantar foot wound. We have been using Hydrofera Blue with antibiotic ointment under the total contact cast on the left lower extremity foot wound. She has no issues or complaints today. 3/20; patient presents for follow-up. She has been using gentamicin ointment and Aquacel Ag to the right plantar foot wound. We have been using Hydrofera Blue with antibiotic ointment under the total contact cast to the left lower extremity wound. She has no issues or complaints today. 3/27; patient presents for  follow-up. We have been using antibiotic ointment and Aquacel Ag to the right plantar foot wound. We have been using Hydrofera Blue with antibiotic ointment under the total contact cast to the left lower extremity. Both wounds are smaller. 01-24-2023 upon evaluation today patient actually appears to be doing better compared to last time I saw her. It has been several weeks since I last saw her but nonetheless I think the total contact casting is doing [name] good job. Fortunately I do not see any evidence of infection or worsening overall which is great news and in general I do believe that she is moving in the appropriate direction. It has been [name] very long road but nonetheless I feel like she is nearing the end at least in regard to the left foot and even the right foot looks much better to be perfectly honest. 01-31-2023 upon evaluation today patient appears to be doing well currently in regard to her wounds. She has been require some sharp debridement which I think is probably bone both sides to remove [name] lot of callus that has built up at this point. I discussed that with her today. Also think total contact cast is doing well for the left foot [name] continue that as well. 4/17; patient presents for follow-up. We have been doing Hydrofera Blue with antibiotic ointment under the total contact cast to the left lower extremity and Aquacel Ag with antibiotic ointment to the right foot wound. She has been using her offloading heel shoe here. 4/24; patient presents for follow-up. We have been doing Hydrofera Blue with antibiotic ointment under the total contact cast to the left lower extremity and Aquacel Ag with antibiotic ointment to the right foot wound. She has been using her offloading heel shoe here. Wounds are smaller with slough accumulation. 5/1; patient presents for follow-up. We have been doing Hydrofera Blue with antibiotic ointment under the total contact cast to  the left lower extremity and Aquacel Ag  with antibiotic ointment to the right foot wound. Left foot wound is stable and right foot wound appears smaller. 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 On the left she is using Hydrofera Blue topical antibiotic under [name] total contact cast, on the right plantar heel gentamicin Aquacel Ag. According to our intake nurse both are making slow but steady improvements. 5/15; patient presents for follow-up. We have been doing Hydrofera Blue with antibiotic ointment under the total contact cast on the left and antibiotic ointment with Aquacel Ag on the right. Unfortunately the left foot wound has declined in size and appearance. We also do not have the total contact cast available in office today. Patient denies signs of infection. 5/22; patient presents for follow-up. We have been doing Hydrofera Blue and antibiotic ointment to the left foot and Aquacel Ag with antibiotic ointment to the right foot. We took [name] break from the cast since we did not have it placed at last week. Wounds look stable if not slightly improved. AADHYA, HIRA [name] ([id number]) [id number] Page 17 of 20 03-27-2023 patient presents today she has had this For Most 2 Weeks Because She Was in the Hospital and They Did Not T It off. Again That Is Really Not ake Something That She Could Control but Nonetheless We Are Seeing Her at This Point Today for Reevaluation. We Do Want to Go and See about Reapply the Cast. Again We Had All Set Everything up to Go Ahead and Do This When We Went to Get the Cast Only to Realize That Not Had Come in and We Were out at the Moment. With That Being York Spaniel We Will Get Have T Get the Exam before We Can Reapply the T Contact Casting Going Forward. o otal 6/12; patient presents for follow-up. She has been using Hydrofera Blue to the left foot wound and Aquacel Ag to the right foot wound. No cast was available last week placed on the patient so she has been offloading with her surgical  shoe. She has no issues or complaints today. 6/19; patient presents for follow-up. We have been using Hydrofera Blue to the left foot wound under the total contact cast and Aquacel Ag to the right wound. She has no issues or complaints today. The wounds are smaller. 6/26; patient presents for follow-up. We have been using Hydrofera Blue to the left foot wound under the total contact cast and Aquacel Ag to the right foot wound. Wounds are smaller. Patient has no issues or complaints today. 7/3; patient presents for follow-up. We have been using Hydrofera Blue to the left foot wound under the total contact cast and Aquacel Ag to the right foot wound. Wounds Appear smaller. No signs of infection. 7/10; patient presents for follow-up. We have been using Hydrofera Blue to the left foot wound under the total contact cast and Aquacel Ag to the right foot wound. Left foot wound is smaller. Right foot wound is stable. 7/17; patient presents for follow-up. We have been using Hydrofera Blue to the left foot under the total contact cast and Aquacel Ag to the right foot wound. She has no issues or complaints today. 7/24; patient presents for follow-up. We have been using Hydrofera Blue to the left foot under the total contact cast and Aquacel Ag to the right foot wound. Unfortunately she was bit by her dog over the weekend and developed [name] wound to the right lower leg. She has been keeping the area  covered. 7/31; patient presents for follow-up. We have been using Hydrofera Blue to the left foot under the total contact cast and Aquacel Ag to the right foot wound and mupirocin ointment to the right anterior leg wound. She started her oral antibiotics 2 days ago. 8/7; patient presents for follow-up. We have been using Hydrofera Blue and antibiotic ointment to the left foot under the total contact cast and Aquacel Ag to the right foot wound. Antibiotic ointment to the right anterior leg wound. She completed her oral  antibiotics. She has no issues or complaints today. Wounds continue to improve albeit slowly. 8/14; 8/14; patient has 3 wounds 1 on the left plantar heel 1 on the right plantar heel and [name] small wound on the right anterior lower leg. We have been using Hydrofera Blue in the left heel Aquacel on the right heel. The left heel has been in [name] total contact cast 8/21; patient presents for follow-up. We have been using Hydrofera Blue to the left foot wound under the total contact cast. T the right heel patient has been o using Aquacel Ag. T the anterior leg wound antibiotic ointment. All wounds have healed. o 8/28; patient presents for follow-up. She has been using her surgical shoe with offloading felt pads. Her wounds have remained closed. Objective Constitutional Vitals Time Taken: 12:26 PM, Height: 69 in, Weight: 178 lbs, BMI: 26.3, Temperature: 98.4 F, Pulse: 52 bpm, Respiratory Rate: 16 breaths/min, Blood Pressure: 117/82 mmHg. General Notes: Epithelization to the previous wound sites on the left foot, right foot and right leg. No signs of infection. No drainage on palpation to the previous wound sites. 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 I recommended continuing to aggressively offload the areas with her offloading felt pads and surgical shoes. Will see her back in 2 weeks as she is high risk for reopening. If all is well then she would do  well for discharge to [name] preventative visit in the 2 to 3 months. Plan DANAISHA, BAEZ [name] ([id number]) [id number] Page 18 of 20 Follow-up Appointments: Return Appointment in 2 weeks. Bathing/ Shower/ Hygiene: May shower; gently cleanse wound with antibacterial soap, rinse and pat dry prior to dressing wounds No tub bath. Off-Loading: Open toe surgical shoe - right, left Offloading felt to foot. - left foot Other: - felt offloading pad to left foot 1. Aggressive offloadingoffloading felt pads and surgical shoes 2. Follow-up in 2 weeks Electronic Signature(s) Signed: 06/21/2023 2:40:32 PM By: Geralyn Corwin DO Entered By: Geralyn Corwin on 06/21/2023 09:50:46 -------------------------------------------------------------------------------- ROS/PFSH Details Patient Name: Date of Service: Heather Jefferson [name]. 06/21/2023 12:15 PM Medical Record Number: [id number] Patient Account Number: [id number] Date of Birth/Sex: Treating RN: June 17, 1976 (47 y.o. [name] Primary Care Provider: [name] Other Clinician: [name] Referring Provider: Treating Provider/Extender: Chapman Moss in Treatment: 78 Information Obtained From Patient Ear/Nose/Mouth/Throat Medical History: Positive for: Chronic sinus problems/congestion; Middle ear problems Hematologic/Lymphatic Medical History: Positive for: Anemia Respiratory Medical History: Positive for: Chronic Obstructive Pulmonary  Disease (COPD) Cardiovascular Medical History: Positive for: Congestive Heart Failure - EF 33% Endocrine Medical History: Positive for: Type II Diabetes Time with diabetes: 14 years Treated with: Insulin, Oral agents Blood sugar tested every day: No Blood sugar testing results: Bedtime: 176 Genitourinary Mackowski, Aviyanna [name] ([id number]) [id number] Page 19 of 20 Medical History: Positive for: End Stage Renal  Disease Integumentary (Skin) Medical History: Positive for: History of pressure wounds Neurologic Medical History: Positive for: Neuropathy - feel and legs HBO Extended History Items Ear/Nose/Mouth/Throat: Ear/Nose/Mouth/Throat: Chronic sinus Middle ear problems problems/congestion Immunizations Pneumococcal Vaccine: Received Pneumococcal Vaccination: No Implantable Devices No devices added Family and Social History Current every day smoker; Marital Status - Single; Alcohol Use: Never; Drug Use: Prior History; Caffeine Use: Daily Electronic Signature(s) Signed: 06/21/2023 2:40:32 PM By: Geralyn Corwin DO Signed: 06/23/2023 11:58:03 AM By: Yevonne Pax RN Entered By: Geralyn Corwin on 06/21/2023 09:51:32 -------------------------------------------------------------------------------- SuperBill Details Patient Name: Date of Service: [name] 06/21/2023 Medical Record Number: [id number] Patient Account Number: [id number] Date of Birth/Sex: Treating RN: [birth year] (47 y.o. [name] Primary Care Provider: [name] Other Clinician: [name] Referring Provider: Treating Provider/Extender: Chapman Moss in Treatment: 66 Diagnosis Coding ICD-10 Codes Code Description [phone] Non-pressure chronic ulcer of other part of left foot with other specified severity 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.621 Type 2 diabetes mellitus with foot ulcer L97.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 of skin W54.0XXA Bitten by dog, initial encounter S81.801A Unspecified open wound, right lower leg, initial encounter E11.622 Type 2 diabetes mellitus with other skin ulcer E11.42 Type 2 diabetes mellitus with diabetic polyneuropathy Z79.4 Long term (current) use of insulin Didion, Joycelyn [name] ([id number]) [id number] Page 20 of  20 Facility Procedures : CPT4 Code: [id number] Description: [phone] - WOUND CARE VISIT-LEV 2 EST PT Modifier: Quantity: 1 Physician Procedures : CPT4 Code Description Modifier [id number] - WC PHYS LEVEL 3 - EST PT ICD-10 Diagnosis Description 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ity L97.512 Non-pressure chronic ulcer of other part of right  foot with fat layer exposed E11.621 Type 2 diabetes mellitus with foot ulcer L97.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 of skin Quantity: 1 Electronic Signature(s) Signed: 06/21/2023 2:40:32 PM By: Geralyn Corwin DO Entered By: Geralyn Corwin on 06/21/2023 09:51:09

## 2023-06-23 NOTE — Progress Notes (Signed)
TECLA, MCCAGHREN [name] ([id number]) [id number] Page 1 of 20 Visit Report for 06/14/2023 Chief Complaint Document Details Patient Name: Date of Service: [name], [name] 06/14/2023 11:00 [name]: [id number] Patient Account Number: [id number] Date of Birth/Sex: Treating RN: [birth year] (47 y.o. [name] Primary Care Provider: [name] Other Clinician: [name] Referring Provider: Treating Provider/Extender: Chapman Moss in Treatment: 42 Information Obtained from: Patient Chief Complaint 03/19/2021; patient referred by Dr. Excell Seltzer who has been looking after her left foot for quite [name] period of time for review of [name] nonhealing area in the left midfoot 03/12/2022; bilateral feet wounds and right lower extremity wound. Electronic Signature(s) Signed: 06/14/2023 12:05:18 PM By: Geralyn Corwin DO Entered By: Geralyn Corwin on 06/14/2023 11:51:54 -------------------------------------------------------------------------------- HPI Details Patient Name: Date of Service: [name], [name] 06/14/2023 11:00 [name]: [id number] Patient Account Number: [id number] Date of Birth/Sex: Treating RN: 07-20-1976 (47 y.o. [name] Primary Care Provider: [name] Other Clinician: [name] Referring Provider: Treating Provider/Extender: Chapman Moss in Treatment: 33 History of Present Illness HPI Description: 01/18/18-She is here for initial evaluation of the left great toe ulcer. She is [name] poor historian in regards to timeframe in detail. She states approximately 4 weeks ago she lacerated her toe on something in the house. She followed up with her primary care who placed her on Bactrim and ultimately [name] second dose of Bactrim prior to coming to wound clinic. She states she has been treating the toe with peroxide, Betadine and [name] Band-Aid. She did not check her blood sugar  this morning but checked it yesterday morning it was 327; she is unaware of [name] recent A1c and there are no current records. She saw Dr. she would've orthopedics last week for an old injury to the left ankle, she states he did not see her toe, nor did she bring it to his attention. She smokes approximately 1 pack cigarettes [name] day. Her social situation is concerning, she arrives this morning with her mother who appears extremely intoxicated/under the influence; her mother was asked to leave the room and be monitored by the patient's grandmother. The patient's aunt then accompanied the patient and the room throughout the rest of the appointment. We had [name] lengthy discussion regarding the deleterious effects of uncontrolled hyperglycemia and smoking as it relates to wound healing and overall health. She was strongly encouraged to decrease her smoking and get her diabetes under better control. She states she is currently on [name] diet and has cut down her [facility] consumption. The left toe is erythematous, macerated and slightly edematous with malodor present. The edema in her left foot is below her baseline, there is no erythema streaking. We will treat her with Santyl, doxycycline; we have ordered and xray, culture and provided [name] Peg assist surgical shoe and cultured the wound. 01/25/18-She is here in follow-up evaluation for [name] left great toe ulcer and presents with an abscess to her suprapubic area. She states her blood sugars remain elevated, feeling "sick" and if levels are below 250, but she is trying. She has made no attempt to decrease her smoking stating that we "can't take away her food in her cigarettes". She has been compliant with offloading using the PEG assist you. She is using Santyl daily. the culture obtained last week grew staph aureus and Enterococcus faecalis; continues on the doxycycline and Augmentin was added on Monday. The suprapubic area has erythema, no femoral  [name], [name]  ([id number]) [id number] Page 2 of 20 variation, purple discoloration, minimal induration, was accessed with [name] cotton tip applicator with sanguinopurulent drainage, this was cultured, I suspect the current antibiotic treatment will cover and we will not add anything to her current treatment plan. She was advised to go to urgent care or ER with any change in redness, induration or fever. 02/01/18-She is here in follow-up evaluation for left great toe ulcers and [name] new abdominal abscess from last week. She was able to use packing until earlier this week, where she "forgot it was there". She states she was feeling ill with GI symptoms last week and was not taking her antibiotic. She states her glucose levels have been predominantly less than 200, with occasional levels between 200-250. She thinks this was contributing to her GI symptoms as they have resolved without intervention. There continues to be significant laceration to left toe, otherwise it clinically looks stable/improved. There is now less superficial opening to the lateral aspect of the great toe that was residual blister. We will transition to [facility] to all wounds, she will continue her Augmentin. If there is no change or deterioration next week for reculture. 02/08/18-She is here in follow-up evaluation for left great toe ulcer and abdominal ulcer. There is an improvement in both wounds. She has been wrapping her left toe with coban, not by our direction, which has created an area of discoloration to the medial aspect; she has been advised to NOT use coban secondary to her neuropathy. She states her glucose levels have been high over this last week ranging from 200-350, she continues to smoke. She admits to being less compliant with her offloading shoe. We will continue with same treatment plan and she will follow-up next week. 02/15/18-She is here in follow-up evaluation for left great toe ulcer and abdominal  ulcer. The abdominal ulcer is epithelialized. The left great toe ulcer is improved and all injury from last week using the Coban wrap is resolved, the lateral ulcer is healed. She admits to noncompliance with wearing offloading shoe and admits to glucose levels being greater than 300 most of the week. She continues to smoke and expresses no desire to quit. There is one area medially that probes deeper than it has historically, erythema to the toe and dorsal foot has consistently waxed and waned. There is no overt signs of cellulitis or infection but we will culture the wound for any occult infection given the new area of depth and erythema. We will hold off on sensitivities for initiation of antibiotic therapy. 02/22/18-She is here in follow up evaluation for left great toe ulcer. There is overall significant improvement in both wound appearance, erythema and edema with changes made last week. She was not initiated on antibiotic therapy. Culture obtained last week showed oxacillin sensitive staph aureus, sensitive to clindamycin. Clindamycin has been called into the pharmacy but she has been instructed to hold off on initiation secondary to overall clinical improvement and her history of antibiotic intolerance. She has been instructed to contact the clinic with any noted changes/deterioration and the wound, erythema, edema and/or pain. She will follow-up next week. She continues to smoke and her glucose levels remain elevated >250; she admits to compliance with offloading shoe 03/01/18 on evaluation today patient appears to be doing fairly well in regard to her left first toe ulcer. She has been tolerating the dressing changes with the [facility] Dressing without complication and overall this has definitely showed signs of  improvement according to records as well is what the patient tells me today. I'm very pleased in that regard. She is having no pain today 03/08/18 She is here for follow up evaluation  of [name] left great toe ulcer. She remains non-compliant with glucose control and smoking cessation; glucose levels consistently >200. She states that she got new shoe inserts/peg assist. She admits to compliance with offloading. Since my last evaluation there is significant improvement. We will switch to prisma at this time and she will follow up next week. She is noted to be tachycardic at this appointment, heart rate 120s; she has [name] history of heart rate 70-130 according to our records. She admits to extreme agitation r/t personal issues; she was advised to monitor her heartrate and contact her physician if it does not return to [name] more normal range (<100). She takes cardizem twice daily. 03/15/18-She is here in follow-up evaluation for left great toe ulcer. She remains noncompliant with glucose control and smoking cessation. She admits to compliance with wearing offloading shoe. The ulcer is improved/stable and we will continue with the same treatment plan and she will follow-up next week 03/22/18-She is here for evaluation for left great toe ulcer. There continues to be significant improvement despite recurrent hyperglycemia (over 500 yesterday) and she continues to smoke. She has been compliant with offloading and we will continue with same treatment plan and she will follow-up next week. 03/29/18-She is here for evaluation for left great toe ulcer. Despite continuing to smoke and uncontrolled diabetes she continues to improve. She is compliant with offloading shoe. We will continue with the same treatment plan and she will follow-up next week 04/05/18- She is here in follow up evaluation for [name] left great toe ulcer; she presents with small pustule to left fifth toe (resembles ant bite). She admits to compliance with wearing offloading shoe; continues to smoke or have uncontrolled blood glucose control. There is more callus than usual with evidence of bleeding; she denies known trauma. 04/12/18-She is here  for evaluation of left great toe ulcer. Despite noncompliance with glycemic control and smoking she continues to make improvement. She continues to wear offloading shoe. The pustule, that was identified last week, to the left fifth toe is resolved. She will follow-up in 2 weeks 05/03/18-she is seen in follow-up evaluation for [name] left great toe ulcer. She is compliant with offloading, otherwise noncompliant with glycemic control and smoking. She has plateaued and there is minimal improvement noted. We will transition to [facility], replaced the insert to her surgical shoe and she will follow-up in one week 05/10/18- She is here in follow up evaluation for [name] left great toe ulcer. It appears stable despite measurement change. We will continue with same treatment plan and follow up next week. 05/24/18-She is seen in follow-up evaluation for [name] left great toe ulcer. She remains compliant with offloading, has made significant improvement in her diet, decreasing the amount of sugar/soda. She said her recent A1c was 10.9 which is lower than. She did see [name] diabetic nutritionist/educator yesterday. She continues to smoke. We will continue with the same treatment plan and she'll follow-up next week. 05/31/18- She is seen in follow-up evaluation for left great toe ulcer. She continues to remain compliant with offloading, continues to make improvement in her diet, increasing her water and decreasing the amount of sugar/soda. She does continue to smoke with no desire to quit. We will apply Prisma to the depth and Hydrofera Blue over. We have not received  insurance authorization for oasis. She will follow up next week. 06/07/18-She is seen in follow-up evaluation for left great toe ulcer. It has stalled according to today's measurements although base appears stable. She says she saw [name] diabetic educator yesterday; her average blood sugars are less than 300 which is an improvement for her. She continues to smoke and  states "that's my next step" She continues with water over soda. We will order for xray, culture and reinstate ace wrap compression prior to placing apligraf for next week. She is voicing no complaints or concerns. Her dressing will change to iodoflex over the next week in preparation for apligraf. 06/14/18-She is seen in follow-up evaluation for left great toe ulcer. Plain film x-ray performed last week was negative for osteomyelitis. Wound culture obtained last week grew strep B and OSSA; she is initiated on keflex and cefdinir today; there is erythema to the toe which could be from ace wrap compression, she has [name] history of wrapping too tight and has has been encouraged to maintain ace wraps that we place today. We will hold off on application of apligraf today, will apply next week after antibiotic therapy has been initiated. She admits today that she has resumed taking [name] shower with her foot/toe submerged in water, she has been reminded to keep foot/toe out of the bath water. She will be seen in follow up next week 06/21/18-she is seen in follow-up evaluation for left great toe ulcer. She is tolerating antibiotic therapy with no GI disturbance. The wound is stable. Apligraf was applied today. She has been decreasing her smoking, only had 4 cigarettes yesterday and 1 today. She continues being more compliant in diabetic diet. She will follow-up next week for evaluation of site, if stable will remove at 2 weeks. 06/28/18- She is here in follow up evalution. Apligraf was placed last week, she states the dressing fell off on Tuesday and she was dressing with hydrofera blue. She is healed and will be discharged from the clinic today. She has been instructed to continue with smoking cessation, continue monitoring glucose levels, offloading for an additional 4 weeks and continue with hydrofera blue for additional two weeks for any possible microscopic opening. Readmission: 08/07/18 on evaluation today  patient presents for reevaluation concerning the ulcer of her right great toe. She was previously discharged on 06/28/18 healed. Nonetheless she states that this began to show signs of drainage she subsequently went to her primary care provider. Subsequently an x-ray was performed on 08/01/18 which was negative. The patient was also placed on antibiotics at that time. Fortunately they should have been effective for the infection. Nonetheless she's been experiencing some improvement but still has [name] lot of drainage coming from the wound itself. 08/14/18 on evaluation today patient's wound actually does show signs of improvement in regard to the erythema at this point. She has completed the antibiotics. With that being said we did discuss the possibility of placing her in [name] total contact cast as of today although I think that I may want to give this just [name] little bit more time to ensure nothing recurrence as far as her infection is concerned. I do not want to put in the cast and risk infection at that time if things are not completely resolved. With that being said she is gonna require some debridement today. 08/21/18 on evaluation today patient actually appears to be doing okay in regard to her toe ulcer. She's been tolerating the dressing changes without complication. With that being  said it does appear that she is ready and in fact I think it's appropriate for [place] to go ahead and initiate the total contact cast today. Nonetheless she will require some sharp debridement to prepare the wound for application. Overall I feel like things have been progressing well but we do need to do something to get this to close more readily. 08/24/18 patient seen today for reevaluation after having had the total contact cast applied on Tuesday. She seems to have done very well the wound appears to be doing great and overall I'm pleased with the progress that she's made. There were no abnormal areas of rubbing from the cast on  her lower extremity. [name], [name] ([id number]) [id number] Page 3 of 20 08/30/18 on evaluation today patient actually appears to be completely healed in regard to her plantar toe ulcer. She tells me at this point she's been having [name] lot of issues with the cast. She almost fell [name] couple of times the state shall the step of her dog [name] couple times as well. This is been [name] very frustrating process for her other nonetheless she has completely healed the wound which is excellent news. Overall there does not appear to be the evidence of infection at this time which is great news. 09/11/18 evaluation today patient presents for follow-up concerning her great toe ulcer on the left which has unfortunately reopened since I last saw her which was only [name] couple of weeks ago. Unfortunately she was not able to get in to get the shoe and potentially the AFO that's gonna be necessary due to her left foot drop. She continues with offloading shoe but this is not enough to prevent her from reopening it appears. When we last had her in the total contact cast she did well from [name] healing standpoint but unfortunately the wound reopened as soon as she came out of the cast within just [name] couple of weeks. 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 This is also part of the reason why she falls on [name] regular basis. I just do not believe that is gonna be safe for [place] to reinitiate the total contact cast as last time we had this on she fell 3 times one week which is definitely not normal for her. 09/18/18 upon evaluation today the patient actually appears to be doing about the same in regard to her toe ulcer. She did not contact [employer] as I asked her to even though I had given her the prescription. In fact she  actually states that she has no idea where the prescription is. She did apparently call [employer] and they told her that all she needed to do was bring the prescription in order to be able to be seen and work on getting the AFO for her left foot. With all that being said she still does not have an appointment and I'm not sure were things stand that regard. I will give her [name] new prescription today in order to contact them to get this set up. 09/25/18 on evaluation today patient actually appears to be doing about the same in regard to her toes ulcer. She does have [name] small areas which seems to have [name] lot of callous buildup around the edge of the wound which  is going to need sharp debridement today. She still is waiting to be scheduled for evaluation with [employer] for possibility of an AFO. She states there supposed to call her tomorrow to get this set up. Unfortunately it does appear that her foot specifically the toe area is showing signs of erythema. There does not appear to be any systemic infection which is in these good news. 10/02/18 on evaluation today patient actually appears to be doing about the same in regard to her toe ulcer. This really has not done too well although it's not significantly larger it's also not significantly smaller. She has been tolerating the dressing changes without complication. She actually has her appointment with [employer] and  tomorrow to hopefully be measured for obtaining and AFO splint. I think this would be helpful preventing this from reoccurring. We had contemplated starting the cast this week although to be honest I am reluctant to do that as she's been having nausea, vomiting, and seizure activity over the past three days. She has [name] history of seizures and have been told is nothing that can be done for these. With that being said I do believe that along with the seizures have the nausea vomiting which upon further questioning doesn't seem to be the normal for  her and makes me concerned for the possibility of infection or something else going on. I discussed this with the patient and her mother during the office visit today. I do not feel the wound is effective but maybe something else. The responses this was "this just happens to her at times and we don't know why". They did not seem to be interested in going to the hospital to have this checked out further. 10/09/18 on evaluation today patient presents for follow-up concerning her ongoing toe ulcer. She has been tolerating the dressing changes without complication. Fortunately there does not appear to be any evidence of infection which is great news however I do think that the patient would benefit from going ahead for with the total contact cast. She's actually in [name] wheelchair today she tells me that she will use her walker if we initiate the cast. I was very specific about the fact that if we were gonna do the cast I wanted to make sure that she was using the walker in order to prevent any falls. She tells me she does not have stairs that she has to traverse on [name] regular basis at her home. She has not had any seizures since last week again that something that happens to her often she tells me she did talk to [employer] and they said that it may take up to three weeks to get the brace approved for her. Hopefully that will not take that long but nonetheless in the meantime I do think the cast could be of benefit. 10/12/18 on evaluation today patient appears to be doing rather well in regard to her toe ulcer. It's just been [name] few days and already this is significantly improved both as far as overall appearance and size. Fortunately there's no sign of infection. She is here for her first obligatory cast change. 10/19/18 Seen today for follow up and management of left great toe ulcer. Wound continues to show improvement. Noted small open area with seroussang drainage with palpation. Denies any increased pain or  recent fevers during visit. She will continue calcium alginate with offloading shoe. Denies any questions or concerns during visit. 10/26/18 on evaluation today patient appears to be doing about the  same as when I last saw her in regard to her wound bed. Fortunately there does not appear to be any signs of infection. Unfortunately she continues to have [name] breakdown in regard to the toe region any time that she is not in the cast. It takes almost no time at all for this to happen. Nonetheless she still has not heard anything from the brace being made by [employer] as to when exactly this will be available to her. Fortunately there is no signs of infection at this time. 10/30/18 on evaluation today patient presents for application of the total contact cast as we just received him this morning. Fortunately we are gonna be able to apply this to her today which is great news. She continues to have no significant pain which is good news. Overall I do feel like things have been improving while she was the cast is when she doesn't have [name] cast that things get worse. She still has not really heard anything from [employer] regarding her brace. 11/02/18 upon evaluation today patient's wound already appears to be doing significantly better which is good news. Fortunately there does not appear to be any signs of infection also good news. Overall I do think the total contact cast as before is helping to heal this area unfortunately it's just not gonna likely keep the area closed and healed without her getting her brace at least. Again the foot drop is [name] significant issue for her. 11/09/18 on evaluation today patient appears to be doing excellent in regard to her toe ulcer which in fact is completely healed. Fortunately we finally got the situation squared away with the paperwork which was needed to proceed with getting her brace approved by Medicaid. I have filled that out unfortunately that information has been sent to the  orthopedic office that I worked at 2 1/2 years ago and not tired Current wound care measures. Fortunately she seems to be doing very well at this time. 11/23/18 on evaluation today patient appears to be doing More Poorly Compared to Last Time I Saw Her. At [facility] She Had Completely Healed. Currently she is continuing to have issues with reopening. She states that she just found out that the brace was approved through Medicaid now she just has to go get measured in order to have this fitted for her and then made. Subsequently she does not have an appointment for this yet that is going to complicate things we obviously cannot put her back in the cast if we do not have everything measured because they're not gonna be able to measure her foot while she is in the cast. 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 Obviously this is unfortunate and does have me somewhat worried as well. 11/30/18 on evaluation today patient's toe ulcer actually appears to be doing fairly well. The good news is she will be getting her brace in the shoes next week on Wednesday. Hopefully we will be able to get this to heal without having to go back in the cast however she may need the cast in order to get the wound completely heal and then go from there. Fortunately there's no signs of infection at this time. 12/07/18 on evaluation today patient fortunately did receive her brace and she states she could tell this definitely makes her walk better. With that being said she's been having issues with  her toe where she noticed yesterday there was [name] lot of tissue that was loosing off this appears to be much larger than what it was previous. She also states that her leg has been read putting much across the top of her foot just about the ankle although this seems to be receiving somewhat. The total area is still red and appears to be  someone infected as best I can tell. She is previously taken Bactrim and that may be [name] good option for her today as well. We are gonna see what I wound culture shows as well and I think that this is definitely appropriate. With that being said outside of the culture I still need to initiate something in the interim and that's what I'm gonna go ahead and select Bactrim is [name] good option for her. 12/14/18 on evaluation today patient appears to be doing better in regard to her left great toe ulcer as compared to last week's evaluation. There's still some erythema although this is significantly improved which is excellent news. Overall I do believe that she is making good progress is still gonna take some time before she is where I would like her to be from the standpoint of being able to place her back into the total contact cast. Hopefully we will be where we need to be by next week. 12/21/18 on evaluation today patient actually appears to be doing poorly in regard to her toe ulcer. She's been tolerating the dressing changes without complication. Fortunately there's no signs of systemic infection although she does have [name] lot of drainage from the toe ulcer and this does seem to be causing NAKASHA, MESAROS [name] ([id number]) [id number] Page 4 of 20 some issues at this point. She does have erythema on the distal portion of her toe that appears to be likely cellulitis. 12/28/18 on evaluation today patient actually appears to be doing [name] little better in my pinion in regard to her toe ulcer. With that being said she still does have some evidence of infection at this time and for her culture she had both E. coli as well as enterococcus as organisms noted on evaluation. For that reason I think that though the Keflex likely has treated the E. coli rather well this has really done nothing for the enterococcus. We are going to have to initiate treatment for this specifically. 01/04/19 on evaluation  today patient's toe actually appears to be doing better from the standpoint of infection. She currently would like to see about putting the cash back on I think that this is appropriate as long as she takes care of it and keeps it from getting wet. She is gonna have some drainage we can definitely pass this up with Drawtex and alginate to try to prevent as much drainage as possible from causing the problems. With that being said I do want to at least try her with the cast between now and Tuesday. If there any issues we can't continue to use it then I will discontinue the use of the cast at that point. 01/08/19 on evaluation today patient actually appears to be doing very well as far as her foot ulcer specifically the great toe on the left is concerned. She did have an area of rubbing on the medial aspect of her left ankle which again is from the cast. Fortunately there's no signs of infection at this point in this appears to be [name] very slight skin breakdown. The patient tells me she felt it rubbing  but didn't think it was that bad. Fortunately there is no signs of active infection at this time which is good news. No fevers, chills, nausea, or vomiting noted at this time. 01/15/19 on evaluation today patient actually appears to be doing well in regard to her toe ulcer. Again as previous she seems to do well and she has the cast on which indicates to me that during the time she doesn't have [name] cast on she's putting way too much pressure on this region. Obviously I think that's gonna be an issue as with the current national emergency concerning the Covid-19 Virus it has been recommended that we discontinue the use of total contact casting by the chief medical officer of our company, Dr. Maurine Minister. The reasoning is that if [name] patient becomes sick and cannot come into have the cast removed they could not just leave this on for an additional two weeks. Obviously the hospitals also do not want to receive patient's who  are sick into the emergency department to potentially contaminate the region and spread the Covid-19 Virus among other sick individuals within the hospital system. Therefore at this point we are suspending the use of total contact cast until the current emergency subsides. This was all discussed with the patient today as well. 01/22/19 on evaluation today patient's wound on her left great toe appears to be doing slightly worse than previously noted last week. She tells me that she has been on this quite [name] bit in fact she tells me she's been awake for 38 straight hours. This is due to the fact that she's having to care for grandparents because nobody else will. She has been taking care of them for five the last seven days since I've seen her they both have dementia his is from [name] stroke and her grandmother's was progressive. Nonetheless she states even her mom who knows her condition and situation has only help two of those days to take care of them she's been taking care of the rest. Fortunately there does not appear to be any signs of active infection in regard to her toe at this point although obviously it doesn't look as good as it did previous. I think this is directly related to her not taking off the pressure and friction by way of taking things easy. Though I completely understand what's going on. 01/29/19 on evaluation today patient's tools are actually appears to be showing some signs of improvement today compared to last week's evaluation as far as not necessarily the overall size of the wound but the fact that she has some new skin growth in between the two ends of the wound opening. Overall I feel like she has done well she states that she had [name] family member give her what sounds to be [name] CAM walker boot which has been helpful as well. 02/05/19 on evaluation today patient's wound bed actually appears to be doing significantly better in regard to her overall appearance of the size of the  wound. With that being said she is still having an issue with offloading efficiently enough to get this to close. Apparently there is some signs of infection at this point as well unfortunately. Previously she's done well of Augmentin I really do not see anything that needs to be culture currently but there theme and cellulitis of the foot that I'm seeing I'm gonna go ahead and place her on an antibiotic today to try to help clear this up. 02/12/2019 on evaluation today patient actually  appears to be doing poorly in regard to her overall wound status. She tells me she has been using her offloading shoe but actually comes in today wearing her tennis shoe with the AFO brace. Again as I previously discussed with her this is really not sufficient to allow the area to heal appropriately. Nonetheless she continues to be somewhat noncompliant and I do wonder based on what she has told my nurse in the past as to whether or not [name] good portion of this noncompliance may be recreational drug and alcohol related. She has had [name] history of heroin overdose and this was fairly recently in the past couple of months that have been seeing her. Nonetheless overall I feel like her wound looks significantly worse today compared to what it was previous. She still has significant erythema despite the Augmentin I am not sure that this is an appropriate medication for her infection I am also concerned that the infection may have gone down into her bone. 02/19/19 on evaluation today patient actually appears to be doing about the same in regard to her toe ulcer. Unfortunately she continues to show signs of bone exposure and infection at this point. There does not appear to be any evidence of worsening of the infection but I'm also not really sure that it's getting significantly better. She is on the Augmentin which should be sufficient for the Staphylococcus aureus infection that she has at this point. With that being said she may  need IV antibiotics to more appropriately treat this. We did have [name] discussion today about hyperbaric option therapy. 02/28/19 on evaluation today patient actually appears to be doing much worse in regard to the wound on her left great toe as compared to even my previous evaluation last week. Unfortunately this seems to be training in [name] pretty poor direction. Her toe was actually now starting to angle laterally and I can actually see the entire joint area of the proximal portion of the digit where is the distal portion of the digit again is no longer even in contact with the joint line. Unfortunately there's [name] lot more necrotic tissue around the edge and the toe appears to be showing signs of becoming gangrenous in my pinion. I'm very concerned about were things stand at this point. She did see infectious disease and they are planning to send in [name] prescription for Sivextro for her and apparently this has been approved. With that being said I don't think she should avoid taking this but at the same time I'm not sure that it's gonna be sufficient to save her toe at this point. She tells me that she still having to care for grandparents which I think is putting quite [name] bit of strain on her foot and specifically the total area and has caused this to break down even to [name] greater degree than would've otherwise been expected. 03/05/19 on evaluation today patient actually appears to be doing quite well in regard to her toe all things considering. She still has bone exposed but there appears to be much less your thing on overall the appearance of the wound and the toe itself is dramatically improved. She still does have some issues currently obviously with infection she did see vascular as well and there concerned that her blood flow to the toad. For that reason they are setting up for an angiogram next week. 03/14/19 on evaluation today patient appears to be doing very poor in regard to her toe and specifically in  regard to the ulceration and the fact that she's starting to notice the toe was leaning even more towards the lateral aspect and the complete joint is visible on the proximal aspect of the joint. Nonetheless she's also noted [name] significant odor and the tip of the toe is turning more dark and necrotic appearing. Overall I think she is getting worse not better as far as this is concerned. For that reason I am recommending at this point that she likely needs to be seen for likely amputation. READMISSION 03/19/2021 This is [name] patient that we cared for in this clinic for [name] prolonged period of time in 2019 and 2020 with [name] left foot and left first toe wound. 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 Excell Seltzer. In December 2021 she had an ulcer on her right great toe as well as the fourth and fifth toes. 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 She also had an angiogram at that time and underwent angioplasty of the right anterior tibial artery. In any case she claims that the wound on the right foot is closed I did not look at this today which was probably an oversight although I think that should be done next week. 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 According to Dr. Bernette Redbird last review that she was out of the area being cared for by another physician but recently came back to his attention. 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 [name] culture of this area showed E. coli apparently before she came back to see Dr. Excell Seltzer she was supposed to be receiving antibiotics but she did not really take them. Nor is she offloading this area at all. Finally her last hemoglobin A1c listed in epic was in March 2022 at 14.1 she says things are [name] lot better since then although I am not sure. She was hospitalized in  March with metabolic multifactorial encephalopathy. 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 She had this wound at the time. During this admission she had E. coli sepsis [name] TEE was negative. 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 She is still [name] ANTANIYA, GWOZDZ [name] ([id number]) [id number] Page 5 of 20 heavy cigarette smoker. She has [name] history of bipolar disorder seizure disorder ABI in our clinic was 1.05 6/1; left midfoot in the setting of [name] TMA done previously. Round circular wound with [name] "knuckle" of protruding tissue. The problem is that the knuckle was not attached to any of the surrounding granulation and this probed proximally widely I removed [name] large portion of this tissue. This wound goes with considerable undermining laterally. I do not feel any bone there was no purulence but this is [name] deep wound. 6/8; in spite of the debridement I did last week. She arrives with [name] wound looking exactly the same. [name] protruding "knuckle" of tissue nonadherent to most of the surrounding tissue. There is considerable depth around this from 6-12 o'clock at 2.7 cm and undermining of 1 cm. This does not look overtly infected and the x- ray I did last week was  negative for any osseous abnormalities. We have been using silver collagen 6/15; deep tissue culture I did last week showed moderate staph aureus and moderate Pseudomonas. This will definitely require prolonged antibiotic therapy. The pathology on the protuberant area was negative for malignancy fungus etc. 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 We have been using silver collagen. I am going to be  prescribing Levaquin for 2 weeks. Her CT scan of the foot is down for 7/5 6/22; CT scan of the foot on 7 5. She says she has hardware in the left leg from her previous fracture. She is on the Levaquin for the deep tissue culture I did that showed methicillin sensitive staph aureus and Pseudomonas. I gave her [name] 2-week supply and she will have another week. She arrives in clinic today with the same protuberant tissue however this is nonadherent to the tissue surrounding it. I am really at [name] loss to explain this unless there is underlying deep tissue infection 6/29; patient presents for 1 week follow-up. She has been using collagen to the wound bed. She reports taking her antibiotics as prescribed.She has no complaints or issues today. She denies signs of infection. 7/6; patient presents for one week followup. She has been using collagen to the wound bed. She states she is taking Levaquin however at times she is not able to keep it down. She denies signs of infection. 7/13; patient presents for 1 week follow-up. She has been using silver alginate to the wound bed. She still has nausea when taking her antibiotics. She denies signs of infection. 7/20; patient presents for 1 week follow-up. She has been using silver alginate with gentamicin cream to the wound bed. She denies any issues and has no complaints today. She denies signs of infection. 7/27; patient presents for 1 week follow-up. She continues to use silver alginate with gentamicin cream to the wound bed. She reports starting her antibiotics. She has no issues or complaints. Overall she reports stability to the wound. 8/3; patient presents for 1 week follow-up. She has been using silver alginate with gentamicin cream to the wound bed. She reports completing all antibiotics. She has no issues or complaints today. She denies signs of infection. 8/17; patient presents for 2-week follow-up. He is to use silver alginate to the wound bed. She has no  issues or complaints today. She denies signs of infection. She reports her pain has improved in her foot since last clinic visit 8/24; patient presents for 1 week follow-up. She continues to use silver alginate to the wound bed. She has no issues or complaints. She denies signs of infection. Pain is stable. 9/7; patient presents for follow-up. She missed her last week appointment due to feeling ill. She continues to use silver alginate. She has [name] new wound to the right lower extremity that is covered in eschar. She states It occurred over the past week and has no idea how it started. She currently denies signs of infection. 9/14; patient presents for follow-up. T the left foot wound she has been using gentamicin cream and silver alginate. T the right lower extremity wound she has o o been keeping this covered and has not obtain Santyl. 9/21; patient presents for follow-up. She reports using gentamicin cream and silver alginate to the left foot and Santyl to the right lower extremity wound. She has no issues or complaints today. She denies signs of infection. 9/28; patient presents for follow-up. She reports [name] new  wound to her right heel. She states this occurred [name] few days ago and is progressively gotten worse. She has been trying to clean the area with [name] Q-tip and Santyl. She reports stability in the other 2 wounds. She has been using gentamicin cream and silver alginate to the left foot and Santyl to the right lower extremity wound. 10/12; patient presents for follow-up. She reports improvement to the wound beds. She is seeing vein and vascular to discuss the potential of [name] left BKA. She states they are going to do an arteriogram. She continues to use silver alginate with dressing changes to her wounds. 11/2; patient presents for follow-up. She states she has not been doing dressing changes to the wound beds. She states she is not able to offload the areas. She reports chronic pain to her left  foot wound. 11/9; patient presents for follow-up. She came in with only socks on. She states she forgot to put on shoes. It is unclear if she is doing any dressing changes. She currently denies systemic signs of infection. 11/16; patient presents for follow-up. She came again only with socks on. She states she does not wear shoes ever. It is unclear if she does dressing changes. She currently denies systemic signs of infection. 11/23; patient presents for follow-up. She wore her shoes today. It still unclear exactly what dressing she is using for each wound but she did states she obtained Dakin's solution and has been using this to the left foot wound. She currently denies signs of infection. 11/30; patient presents for follow-up. She has no issues or complaints today. She currently denies signs of infection. 12/7; patient presents for follow-up. She has no issues or complaints today. She has been using Hydrofera Blue to the right heel wound and Dakin solution to the left foot wound. Her right anterior leg wound is healed. She currently denies signs of infection. 12/14; patient presents for follow-up. She has been using Hydrofera Blue to the right heel and Dakin's to the left foot wounds. She has no issues or complaints today. She denies signs of infection. 12/21; patient presents for follow-up. She reports using Hydrofera Blue to the right heel and Dakin's to the left foot wound. She denies signs of infection. 12/28; patient presents for follow-up. She continues to use Dakin's to the left foot wound and Hydrofera Blue to the right heel wound. She denies signs of infection. 1/4; patient presents for follow-up. She has no issues or complaints today. She denies signs of infection. 1/11; patient presents for follow-up. It is unclear if she has been dressing these wounds over the past week. She currently denies signs of infection. 1/18; patient presents for follow-up. She states she has been using  Dakin's wet-to-dry dressings to the left foot. She has been using Hydrofera Blue to the right foot foot wound. She states that the anterior right leg wound has reopened and draining serous fluid. She denies signs of infection. 1/25; patient presents for follow-up. She has no issues or complaints today. 2/1; patient presents for follow-up. She has no issues or complaints today. She denies signs of infection. 2/8; patient presents for follow-up. She has lost her surgical shoes. She did not have [name] dressing to the right heel wound. She currently denies signs of infection. 2/15; patient presents for follow-up. She reports more pain to the right heel today. She denies purulent drainage Or fever/chills 2/22; patient presents for follow-up. She reports taking clindamycin over the past week. She states that she continues  to have pain to her right heel. She reports purulent drainage. LORRY, WANT [name] ([id number]) [id number] Page 6 of 20 Readmission 03/16/2022 Ms. Jersei Gubbels is [name] 47 year old female with [name] past medical history of type 2 diabetes, osteomyelitis to her feet, chronic systolic heart failure and bipolar disorder that presents to the clinic for bilateral feet wounds and right lower extremity wound. She was last seen in our clinic on 12/15/2021. At that time she had purulent drainage coming out of her right plantar foot and I recommended she go to the ED. She states she went to [facility] and has been there for the past 3 months. I cannot see the records. She states she had OR debridement and was on several weeks of IV antibiotics while inpatient. Since discharge she has not been taking care of the wound beds. She had nothing on her feet other than socks today. She currently denies signs of infection. 5/31; patient presents for follow-up. She has been using Dakin's wet-to-dry dressings to the wound beds on her feet bilaterally and antibiotic ointment to  the right anterior leg wound. She had [name] wound culture done at last clinic visit that showed moderate Pseudomonas aeruginosa sensitive to ciprofloxacin. She currently denies systemic signs of infection. 6/14; patient presents for follow-up. She received Keystone 5 days ago and has been using this on the wound beds. She states that last week she had to go to the hospital because she had increased warmth and erythema to the right foot. She was started on 2 oral antibiotics. She states she has been taking these. She currently denies systemic signs of infection. She has no issues or complaints today. 6/21; patient presents for follow-up. She states she has been using Keystone antibiotics to the wound beds. She has no issues or complaints today. She denies signs of infection. 6/28; patient presents for follow-up. She has been using Keystone antibiotics to the wound beds. She has no issues or complaints today. 7/12; patient presents for follow-up. Has been using Keystone antibiotics to the wound beds with calcium alginate. She has no issues or complaints today. She never followed up with her orthopedic surgeon who did the OR debridement to the right foot. We discussed the total contact cast for the left foot and patient would like to do this next week. 7/19; patient presents for follow-up. She has been using Keystone antibiotics with calcium alginate to the wound beds. She has no issues or complaints today. Patient is in agreement to do the total contact cast of the left foot today. She knows to return later this week for the obligatory cast change. 05-13-2022 upon evaluation today patient's wound which she has the cast of the left leg actually appears to be doing significantly better. Fortunately I do not see any signs of active infection locally or systemically which is great news and overall I am extremely pleased with where we stand currently. 7/26; patient presents for follow-up. She has [name] cast in place  for the past week. She states it irritated her shin. Other than that she tolerated the cast well. She states she would like [name] break for 1 week from the cast. We have been using Keystone antibiotic and Aquacel to both wound beds. She denies signs of infection. 8/2; patient presents for follow-up. She has been using Keystone and Aquacel to the wound beds. She denies any issues and has no complaints. She is agreeable to have the cast placed today for the left leg. 06-03-2022 upon evaluation today  patient appears to be doing well with regard to her wound she saw some signs of improvement which is great news. Fortunately I do not see any evidence of active infection locally or systemically at this time which is great news. No fevers, chills, nausea, vomiting, or diarrhea. 8/16; patient presents for follow-up. She has no issues or complaints today. We have been using Keystone and Aquacel to the wound beds. The left lower extremity is in [name] total contact cast. She is tolerated this well. 8/23; patient presents for follow-up. She has had the total contact cast on the left leg for the past week. Unfortunately this has rubbed and broken down the skin to the medial foot. She currently denies signs of infection. She has been using Keystone antibiotic to the right plantar foot wound. 8/30; patient presents for follow-up. We have held off on the total contact cast for the left leg for the past week. Her wound on the left foot has improved and the previous surrounding breakdown of skin has epithelialized. She has been using Keystone antibiotic to both wound beds. She has no issues or complaints today. She denies signs of infection. 9/6; patient presents for follow-up. She has ordered her's Keystone antibiotic and this is arriving this week. She has been doing Dakin's wet-to-dry dressings to the wound beds. She denies signs of infection. She is agreeable to the total contact cast today. 9/13; patient presents for  follow-up. She states that the cast caused her left leg shin to be sore. She would like to take [name] break from the cast this week. She has been using Keystone antibiotic to the right plantar foot wound. She denies signs of infection. 9/20; patient presents for follow-up. She has been using Keystone antibiotics to the wound beds with calcium alginate to the right foot wound and Hydrofera Blue to the left foot wound. She is agreeable to having the cast placed today. She has been approved for Apligraf and we will order this for next clinic visit. 9/27; patient presents for follow-up. We have been using Keystone antibiotic with Hydrofera Blue to the left foot wound under [name] total contact cast. T the right o foot wound she has been using Keystone antibiotic and calcium alginate. She declines [name] total contact cast today. Apligraf is available for placement and she would like to proceed with this. 07-28-2022 upon evaluation today patient appears to be doing well currently in regard to her wound. She is actually showing signs of significant improvement which is great news. Fortunately I do not see any evidence of active infection locally nor systemically at this time. She has been seeing Dr. Mikey Bussing and to be honest has been doing very well with the cast. Subsequently she comes in today with [name] cast on and we did reapply that today as well. She did not really want to she try to talk me out of that but I explained that if she wanted to heal this is really the right way to go. Patient voiced understanding. In regard to her right foot this is actually [name] lot better compared to the last time I saw her which is also great news. 10/11; patient presents for follow-up. Apligraf and the total contact cast was placed to the left leg at last clinic visit. She states that her right foot wound had burning pain to it with the placement of Apligraf to this area. She has been doing [place] over this area. She denies signs of  infection including increased warmth, erythema or  purulent drainage. 11/1; 3-week follow-up. The patient fortunately did not have [name] total contact cast or an Apligraf and on the left foot. She has been using Keystone ABD pads and kerlix and her own running shoes She arrives in clinic today with thick callus and [name] very poor surface on the left foot on the right nonviable skin subcutaneous tissue and [name] deep probing hole. 11/15; patient missed her last clinic appointment. She states she has not been dressing the wound beds for the past 2 weeks. She states that at she had [name] new roommate but is now going back to live with her mother. Apparently its been [name] distracting 2 weeks. Patient currently denies signs of infection. 11/22; patient presents for follow-up. She states she has been using Keystone antibiotic and Dakin's wet-to-dry dressings to the wound beds. She is agreeable for cast placement today. We had ordered Apligraf however this has not been received by our facility. 11/29; Patient had [name] total contact cast placed at last clinic visit and she tolerated this well. We were using silver alginate under the cast. Patient's been using Keystone antibiotic with Aquacel to the right plantar foot wound. She has no issues or complaints today. Apligraf is available for placement today. Patient would like to proceed with this. 12/6; patient presents for follow-up. She had Apligraf placed in standard fashion last clinic visit under the total contact cast to the left lower extremity. She has been using Keystone antibiotic and Aquacel to the right plantar foot wound. She has no issues or complaints today. [name], [name] ([id number]) [id number] Page 7 of 20 12/13; patient presents for follow-up. She has finished 5 Apligraf placements. Was told she would not qualify for more. We have been doing [name] total contact cast to the left lower extremity. She has been using Keystone antibiotic and  Aquacel to the right plantar foot wound. She has no issues or complaints today. 12/20; patient presents for follow-up. We have been using Hydrofera Blue with Keystone antibiotic under [name] total contact cast of the left lower extremity. She reports using Keystone antibiotic and silver alginate to the right heel wound. She has no issues or complaints today. 12/27; patient presents with [name] healthy wound on the left midfoot. We have Apligraf to apply that to that more also using [name] total contact cast. On the right we are using Keystone and silver alginate. She is offloading the right heel with [name] surgical shoealthough by her admission she is on her feet quite [name] bit 1/3; patient presents for follow-up. Apligraf was placed to the wound beds last clinic visit. She was placed in [name] total contact cast to the left lower extremity. She declines [name] total contact cast today. She states that her mother is in the hospital and she cannot adequately get around with the cast on. 1/10; patient presents for follow-up. She declined the total contact cast at last clinic visit. Both wounds have declined in appearance. She states that she has been on her feet and not offloading the wound beds. She currently denies signs of infection. 1/17; patient presents for follow-up. She had the total contact cast along with Apligraf placed last week to the left lower extremity. She tolerated this well. She has been using Aquacel Ag and Keystone antibiotic to the right heel wound. She currently denies signs of infection. She has no issues or complaints today. 1/24; patient presents for follow-up. We have been using Apligraf to the left foot wound along with [name] total contact cast. She  has done well with this. T the right o heel wound she has been using Aquacel Ag and Keystone antibiotic ointment. She has no issues or complaints today. She denies signs of infection. 1/31; patient presents for follow-up. We have been using Apligraf to the left foot  wound along with the total contact cast. She continues to do well with this. To the right heel we have been using Aquacel Ag and Keystone antibiotic ointment. She has no issues or complaints today. 12-01-2022 upon evaluation patient is seen today on my schedule due to the fact that she unfortunately was in the hospital yesterday. Her cast needed to come off the only reason she is out of the hospital is due to the fact that they would not take it off at the hospital which is somewhat bewildered reading to me to be perfectly honest. I am not certain why this was but either way she was released and then was placed on my schedule today in order to get this off and reapply the total contact casting as appropriate. I do not have an Apligraf for her today it was applied last week and today's actually expired yesterday as there was some scheduling conflicts with her being in the hospital. Nonetheless we do not have that for reapplication today but the good news that she is not draining too much and the Apligraf can go for up to 2 weeks so I am going to go ahead and reapply the total contact casting but we are going to leave the Apligraf in place. 2/14; patient presents for follow-up. T the left leg she has had the total contact cast and Apligraf for the past week. She has had no issues with this. T the o o right heel she has been using antibiotic ointment and Aquacel Ag. 2/21; patient presents for follow-up. We have been using Apligraf and [name] total contact cast to the left lower extremity. She is tolerated this well. Unfortunately she is not approved for any more Apligraf per insurance. She has been using antibiotic ointment and Aquacel Ag to the right foot. She has no issues or complaints today. 2/28; patient presents for follow-up. We have been using Hydrofera Blue and antibiotic ointment under the total contact cast to the left lower extremity. He has been using Aquacel Ag with antibiotic ointment to the  right plantar foot. She has no issues or complaints today. 3/6; patient presents for follow-up. She did not obtain her gentamicin ointment. She has been using Aquacel Ag to the right plantar foot wound. We have been using Hydrofera Blue with antibiotic ointment under the total contact cast to the left lower extremity. She has no issues or complaints today. 3/12; patient presents for follow-up. She has been using gentamicin ointment and Aquacel Ag to the right plantar foot wound. We have been using Hydrofera Blue with antibiotic ointment under the total contact cast on the left lower extremity foot wound. She has no issues or complaints today. 3/20; patient presents for follow-up. She has been using gentamicin ointment and Aquacel Ag to the right plantar foot wound. We have been using Hydrofera Blue with antibiotic ointment under the total contact cast to the left lower extremity wound. She has no issues or complaints today. 3/27; patient presents for follow-up. We have been using antibiotic ointment and Aquacel Ag to the right plantar foot wound. We have been using Hydrofera Blue with antibiotic ointment under the total contact cast to the left lower extremity. Both wounds are smaller. 01-24-2023  upon evaluation today patient actually appears to be doing better compared to last time I saw her. It has been several weeks since I last saw her but nonetheless I think the total contact casting is doing [name] good job. Fortunately I do not see any evidence of infection or worsening overall which is great news and in general I do believe that she is moving in the appropriate direction. It has been [name] very long road but nonetheless I feel like she is nearing the end at least in regard to the left foot and even the right foot looks much better to be perfectly honest. 01-31-2023 upon evaluation today patient appears to be doing well currently in regard to her wounds. She has been require some sharp debridement which I  think is probably bone both sides to remove [name] lot of callus that has built up at this point. I discussed that with her today. Also think total contact cast is doing well for the left foot Emina continue that as well. 4/17; patient presents for follow-up. We have been doing Hydrofera Blue with antibiotic ointment under the total contact cast to the left lower extremity and Aquacel Ag with antibiotic ointment to the right foot wound. She has been using her offloading heel shoe here. 4/24; patient presents for follow-up. We have been doing Hydrofera Blue with antibiotic ointment under the total contact cast to the left lower extremity and Aquacel Ag with antibiotic ointment to the right foot wound. She has been using her offloading heel shoe here. Wounds are smaller with slough accumulation. 5/1; patient presents for follow-up. We have been doing Hydrofera Blue with antibiotic ointment under the total contact cast to the left lower extremity and Aquacel Ag with antibiotic ointment to the right foot wound. Left foot wound is stable and right foot wound appears smaller. 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 On the left she is using Hydrofera Blue topical antibiotic under [name] total contact cast, on the right plantar heel gentamicin Aquacel Ag. According to our intake nurse both are making slow but steady improvements. 5/15; patient presents for follow-up. We have been doing Hydrofera Blue with antibiotic ointment under the total contact cast on the left and antibiotic ointment with Aquacel Ag on the right. Unfortunately the left foot wound has declined in size and appearance. We also do not have the total contact cast available in office today. Patient denies signs of infection. 5/22; patient presents for follow-up. We have been doing Hydrofera Blue and antibiotic ointment to the left foot and Aquacel Ag with antibiotic ointment to the right foot. We took [name] break from the cast since we did not have it  placed at last week. Wounds look stable if not slightly improved. 03-27-2023 patient presents today she has had this For Most 2 Weeks Because She Was in the Hospital and They Did Not T It off. Again That Is Really Not ake Something That She Could Control but Nonetheless We Are Seeing Her at This Point Today for Reevaluation. We Do Want to Go and See about Reapply the Cast. Again We Had All Set Everything up to Go Ahead and Do This When We Went to Get the Cast Only to Realize That Not Had Come in and We Were out at the Moment. With That Being [place] Spaniel We Will Get Have T Get the Exam before We Can Reapply the T Contact Casting Going Forward. o otal 6/12; patient presents for follow-up. She has been using Hydrofera Blue to  the left foot wound and Aquacel Ag to the right foot wound. No cast was available last week placed on the patient so she has been offloading with her surgical shoe. She has no issues or complaints today. 6/19; patient presents for follow-up. We have been using Hydrofera Blue to the left foot wound under the total contact cast and Aquacel Ag to the right wound. She has no issues or complaints today. The wounds are smaller. [name], [name] ([id number]) [id number] Page 8 of 20 6/26; patient presents for follow-up. We have been using Hydrofera Blue to the left foot wound under the total contact cast and Aquacel Ag to the right foot wound. Wounds are smaller. Patient has no issues or complaints today. 7/3; patient presents for follow-up. We have been using Hydrofera Blue to the left foot wound under the total contact cast and Aquacel Ag to the right foot wound. Wounds Appear smaller. No signs of infection. 7/10; patient presents for follow-up. We have been using Hydrofera Blue to the left foot wound under the total contact cast and Aquacel Ag to the right foot wound. Left foot wound is smaller. Right foot wound is stable. 7/17; patient presents for follow-up. We  have been using Hydrofera Blue to the left foot under the total contact cast and Aquacel Ag to the right foot wound. She has no issues or complaints today. 7/24; patient presents for follow-up. We have been using Hydrofera Blue to the left foot under the total contact cast and Aquacel Ag to the right foot wound. Unfortunately she was bit by her dog over the weekend and developed [name] wound to the right lower leg. She has been keeping the area covered. 7/31; patient presents for follow-up. We have been using Hydrofera Blue to the left foot under the total contact cast and Aquacel Ag to the right foot wound and mupirocin ointment to the right anterior leg wound. She started her oral antibiotics 2 days ago. 8/7; patient presents for follow-up. We have been using Hydrofera Blue and antibiotic ointment to the left foot under the total contact cast and Aquacel Ag to the right foot wound. Antibiotic ointment to the right anterior leg wound. She completed her oral antibiotics. She has no issues or complaints today. Wounds continue to improve albeit slowly. 8/14; 8/14; patient has 3 wounds 1 on the left plantar heel 1 on the right plantar heel and [name] small wound on the right anterior lower leg. We have been using Hydrofera Blue in the left heel Aquacel on the right heel. The left heel has been in [name] total contact cast 8/21; patient presents for follow-up. We have been using Hydrofera Blue to the left foot wound under the total contact cast. T the right heel patient has been o using Aquacel Ag. T the anterior leg wound antibiotic ointment. All wounds have healed. o Electronic Signature(s) Signed: 06/14/2023 12:05:18 PM By: Geralyn Corwin DO Entered By: Geralyn Corwin on 06/14/2023 11:54:15 -------------------------------------------------------------------------------- Physical Exam Details Patient Name: Date of Service: [name], [name] 06/14/2023 11:00 [name]: [id number] Patient  Account Number: [id number] Date of Birth/Sex: Treating RN: July 14, 1976 (47 y.o. [name] Primary Care Provider: [name] Other Clinician: [name] Referring Provider: Treating Provider/Extender: Chapman Moss in Treatment: 103 Constitutional . Cardiovascular . Psychiatric . Notes Epithelization to the previous wound sites on the left foot, right foot and right leg. No signs of infection. No drainage on palpation to the previous wound sites. Electronic  Signature(s) Signed: 06/14/2023 12:05:18 PM By: Geralyn Corwin DO Entered By: Geralyn Corwin on 06/14/2023 11:55:04 [name] ([id number]) [id number] Page 9 of 20 -------------------------------------------------------------------------------- Physician Orders Details Patient Name: Date of Service: AANSHI, HORSEMAN 06/14/2023 11:00 [name]: [id number] Patient Account Number: [id number] Date of Birth/Sex: Treating RN: [birth year] (47 y.o. [name] Primary Care Provider: [name] Other Clinician: [name] Referring Provider: Treating Provider/Extender: Chapman Moss in Treatment: 78 Verbal / Phone Orders: Yes Clinician: Yevonne Pax Read Back and Verified: Yes Diagnosis Coding Follow-up Appointments Return Appointment in 1 week. Bathing/ Shower/ Hygiene May shower; gently cleanse wound with antibacterial soap, rinse and pat dry prior to dressing wounds No tub bath. Off-Loading Open toe surgical shoe - right, left Offloading felt to foot. - left foot Other: - felt offloading pad to left foot Electronic Signature(s) Signed: 06/14/2023 3:13:38 PM By: Geralyn Corwin DO Signed: 06/14/2023 5:02:30 PM By: [name] Previous Signature: 06/14/2023 12:05:18 PM Version By: Geralyn Corwin DO Entered By: [name] on 06/14/2023  12:55:14 -------------------------------------------------------------------------------- Problem List Details Patient Name: Date of Service: Heather Jefferson [name]. 06/14/2023 11:00 [name]: [id number] Patient Account Number: [id number] Date of Birth/Sex: Treating RN: [birth year] (47 y.o. 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 03/16/2022 No Yes Ghosh, Chaquetta [name] ([id number]) [id number] Page 10 of 20 E11.621 Type 2 diabetes mellitus with foot ulcer 03/16/2022 No Yes L97.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 03/16/2022 No Yes of skin W54.0XXA Bitten by dog, initial encounter 05/17/2023 No Yes S81.801A Unspecified open wound, right lower leg, initial encounter 05/17/2023 No Yes E11.622 Type 2 diabetes mellitus with other skin ulcer 05/17/2023 No Yes E11.42 Type 2 diabetes mellitus with diabetic polyneuropathy 03/16/2022 No Yes Z79.4 Long term (current) use of insulin 02/22/2023 No Yes Inactive Problems Resolved Problems Electronic Signature(s) Signed: 06/14/2023 12:05:18 PM By: Geralyn Corwin DO Entered By: Geralyn Corwin on 06/14/2023 11:51:48 -------------------------------------------------------------------------------- Progress Note Details Patient Name: Date of Service: Heather Jefferson [name]. 06/14/2023 11:00 [name]: [id number] Patient Account Number: [id number] Date of Birth/Sex: Treating RN: [birth year] (47 y.o. [name] Primary Care Provider: [name] Other Clinician: [name] Referring Provider: Treating Provider/Extender: Chapman Moss in Treatment: 5 Subjective Chief Complaint Information obtained from Patient 03/19/2021; patient referred by Dr. Excell Seltzer who has been looking after her left foot for quite [name] period of time for review of [name] nonhealing area in the left midfoot 03/12/2022; bilateral feet wounds and right lower extremity wound. History of Present Illness (HPI) 01/18/18-She is here for initial evaluation of the left great toe ulcer. She is [name] poor historian in regards to timeframe in detail. She states approximately 4 weeks ago she lacerated her toe on something in the house. She followed up with her primary care who placed her on Bactrim and ultimately [name] second dose of Bactrim prior to coming to wound clinic. She states she has been treating the toe with peroxide, Betadine and [name] Band-Aid. She did not check her blood sugar this morning but checked it yesterday morning it  was 327; she is unaware of [name] recent A1c and there are no current records. She saw Dr. she would've orthopedics YASMINE, SEGLER [name] ([id number]) [id number] Page 11 of 20 last week for an old injury to the left ankle, she states he did not see her toe, nor did she bring it to his attention. She smokes approximately 1 pack cigarettes [name] day. Her social situation is concerning, she arrives this morning with her mother who appears extremely intoxicated/under the influence; her mother was asked to leave the room and be monitored by the patient's grandmother. The patient's aunt then accompanied the patient and the room throughout the rest of the appointment. We had [name] lengthy discussion regarding the deleterious effects of uncontrolled hyperglycemia and smoking as it relates to wound healing and overall health. She was strongly encouraged to decrease her smoking and get her diabetes under better control. She states she  is currently on [name] diet and has cut down her [facility] consumption. The left toe is erythematous, macerated and slightly edematous with malodor present. The edema in her left foot is below her baseline, there is no erythema streaking. We will treat her with Santyl, doxycycline; we have ordered and xray, culture and provided [name] Peg assist surgical shoe and cultured the wound. 01/25/18-She is here in follow-up evaluation for [name] left great toe ulcer and presents with an abscess to her suprapubic area. She states her blood sugars remain elevated, feeling "sick" and if levels are below 250, but she is trying. She has made no attempt to decrease her smoking stating that we "can't take away her food in her cigarettes". She has been compliant with offloading using the PEG assist you. She is using Santyl daily. the culture obtained last week grew staph aureus and Enterococcus faecalis; continues on the doxycycline and Augmentin was added on Monday. The suprapubic area has erythema, no femoral variation, purple discoloration, minimal induration, was accessed with [name] cotton tip applicator with sanguinopurulent drainage, this was cultured, I suspect the current antibiotic treatment will cover and we will not add anything to her current treatment plan. She was advised to go to urgent care or ER with any change in redness, induration or fever. 02/01/18-She is here in follow-up evaluation for left great toe ulcers and [name] new abdominal abscess from last week. She was able to use packing until earlier this week, where she "forgot it was there". She states she was feeling ill with GI symptoms last week and was not taking her antibiotic. She states her glucose levels have been predominantly less than 200, with occasional levels between 200-250. She thinks this was contributing to her GI symptoms as they have resolved without intervention. There continues to be significant laceration to left toe, otherwise it clinically looks  stable/improved. There is now less superficial opening to the lateral aspect of the great toe that was residual blister. We will transition to [facility] to all wounds, she will continue her Augmentin. If there is no change or deterioration next week for reculture. 02/08/18-She is here in follow-up evaluation for left great toe ulcer and abdominal ulcer. There is an improvement in both wounds. She has been wrapping her left toe with coban, not by our direction, which has created an area of discoloration to the medial aspect; she has been advised to NOT use coban secondary to her neuropathy. She states her glucose levels have been high over this last week ranging from 200-350, she continues to smoke. She admits to being less  compliant with her offloading shoe. We will continue with same treatment plan and she will follow-up next week. 02/15/18-She is here in follow-up evaluation for left great toe ulcer and abdominal ulcer. The abdominal ulcer is epithelialized. The left great toe ulcer is improved and all injury from last week using the Coban wrap is resolved, the lateral ulcer is healed. She admits to noncompliance with wearing offloading shoe and admits to glucose levels being greater than 300 most of the week. She continues to smoke and expresses no desire to quit. There is one area medially that probes deeper than it has historically, erythema to the toe and dorsal foot has consistently waxed and waned. There is no overt signs of cellulitis or infection but we will culture the wound for any occult infection given the new area of depth and erythema. We will hold off on sensitivities for initiation of antibiotic therapy. 02/22/18-She is here in follow up evaluation for left great toe ulcer. There is overall significant improvement in both wound appearance, erythema and edema with changes made last week. She was not initiated on antibiotic therapy. Culture obtained last week showed oxacillin sensitive  staph aureus, sensitive to clindamycin. Clindamycin has been called into the pharmacy but she has been instructed to hold off on initiation secondary to overall clinical improvement and her history of antibiotic intolerance. She has been instructed to contact the clinic with any noted changes/deterioration and the wound, erythema, edema and/or pain. She will follow-up next week. She continues to smoke and her glucose levels remain elevated >250; she admits to compliance with offloading shoe 03/01/18 on evaluation today patient appears to be doing fairly well in regard to her left first toe ulcer. She has been tolerating the dressing changes with the [facility] Dressing without complication and overall this has definitely showed signs of improvement according to records as well is what the patient tells me today. I'm very pleased in that regard. She is having no pain today 03/08/18 She is here for follow up evaluation of [name] left great toe ulcer. She remains non-compliant with glucose control and smoking cessation; glucose levels consistently >200. She states that she got new shoe inserts/peg assist. She admits to compliance with offloading. Since my last evaluation there is significant improvement. We will switch to prisma at this time and she will follow up next week. She is noted to be tachycardic at this appointment, heart rate 120s; she has [name] history of heart rate 70-130 according to our records. She admits to extreme agitation r/t personal issues; she was advised to monitor her heartrate and contact her physician if it does not return to [name] more normal range (<100). She takes cardizem twice daily. 03/15/18-She is here in follow-up evaluation for left great toe ulcer. She remains noncompliant with glucose control and smoking cessation. She admits to compliance with wearing offloading shoe. The ulcer is improved/stable and we will continue with the same treatment plan and she will follow-up next  week 03/22/18-She is here for evaluation for left great toe ulcer. There continues to be significant improvement despite recurrent hyperglycemia (over 500 yesterday) and she continues to smoke. She has been compliant with offloading and we will continue with same treatment plan and she will follow-up next week. 03/29/18-She is here for evaluation for left great toe ulcer. Despite continuing to smoke and uncontrolled diabetes she continues to improve. She is compliant with offloading shoe. We will continue with the same treatment plan and she will follow-up next week 04/05/18-  She is here in follow up evaluation for [name] left great toe ulcer; she presents with small pustule to left fifth toe (resembles ant bite). She admits to compliance with wearing offloading shoe; continues to smoke or have uncontrolled blood glucose control. There is more callus than usual with evidence of bleeding; she denies known trauma. 04/12/18-She is here for evaluation of left great toe ulcer. Despite noncompliance with glycemic control and smoking she continues to make improvement. She continues to wear offloading shoe. The pustule, that was identified last week, to the left fifth toe is resolved. She will follow-up in 2 weeks 05/03/18-she is seen in follow-up evaluation for [name] left great toe ulcer. She is compliant with offloading, otherwise noncompliant with glycemic control and smoking. She has plateaued and there is minimal improvement noted. We will transition to [facility], replaced the insert to her surgical shoe and she will follow-up in one week 05/10/18- She is here in follow up evaluation for [name] left great toe ulcer. It appears stable despite measurement change. We will continue with same treatment plan and follow up next week. 05/24/18-She is seen in follow-up evaluation for [name] left great toe ulcer. She remains compliant with offloading, has made significant improvement in her diet, decreasing the amount of sugar/soda.  She said her recent A1c was 10.9 which is lower than. She did see [name] diabetic nutritionist/educator yesterday. She continues to smoke. We will continue with the same treatment plan and she'll follow-up next week. 05/31/18- She is seen in follow-up evaluation for left great toe ulcer. She continues to remain compliant with offloading, continues to make improvement in her diet, increasing her water and decreasing the amount of sugar/soda. She does continue to smoke with no desire to quit. We will apply Prisma to the depth and Hydrofera Blue over. We have not received insurance authorization for oasis. She will follow up next week. 06/07/18-She is seen in follow-up evaluation for left great toe ulcer. It has stalled according to today's measurements although base appears stable. She says she saw [name] diabetic educator yesterday; her average blood sugars are less than 300 which is an improvement for her. She continues to smoke and states "that's my next step" She continues with water over soda. We will order for xray, culture and reinstate ace wrap compression prior to placing apligraf for next week. She is voicing no complaints or concerns. Her dressing will change to iodoflex over the next week in preparation for apligraf. 06/14/18-She is seen in follow-up evaluation for left great toe ulcer. Plain film x-ray performed last week was negative for osteomyelitis. Wound culture obtained last week grew strep B and OSSA; she is initiated on keflex and cefdinir today; there is erythema to the toe which could be from ace wrap compression, she has [name] history of wrapping too tight and has has been encouraged to maintain ace wraps that we place today. We will hold off on application of apligraf today, will apply next week after antibiotic therapy has been initiated. She admits today that she has resumed taking [name] shower with her foot/toe submerged in water, she has been reminded to keep foot/toe out of the bath water. She  will be seen in follow up next week 06/21/18-she is seen in follow-up evaluation for left great toe ulcer. She is tolerating antibiotic therapy with no GI disturbance. The wound is stable. Apligraf was applied today. She has been decreasing her smoking, only had 4 cigarettes yesterday and 1 today. She continues being more compliant  in diabetic diet. She will follow-up next week for evaluation of site, if stable will remove at 2 weeks. 06/28/18- She is here in follow up evalution. Apligraf was placed last week, she states the dressing fell off on Tuesday and she was dressing with hydrofera blue. She is healed and will be discharged from the clinic today. She has been instructed to continue with smoking cessation, continue monitoring glucose levels, offloading for an additional 4 weeks and continue with hydrofera blue for additional two weeks for any possible microscopic opening. Readmission: 08/07/18 on evaluation today patient presents for reevaluation concerning the ulcer of her right great toe. She was previously discharged on 06/28/18 healed. Nonetheless she states that this began to show signs of drainage she subsequently went to her primary care provider. Subsequently an x-ray was performed on 08/01/18 which was negative. The patient was also placed on antibiotics at that time. Fortunately they should have been effective for the infection. Nonetheless she's been experiencing some improvement but still has [name] lot of drainage coming from the wound itself. 08/14/18 on evaluation today patient's wound actually does show signs of improvement in regard to the erythema at this point. She has completed the AUBRIELLA, SHOTT [name] ([id number]) [id number] Page 12 of 20 antibiotics. With that being said we did discuss the possibility of placing her in [name] total contact cast as of today although I think that I may want to give this just [name] little bit more time to ensure nothing recurrence as far  as her infection is concerned. I do not want to put in the cast and risk infection at that time if things are not completely resolved. With that being said she is gonna require some debridement today. 08/21/18 on evaluation today patient actually appears to be doing okay in regard to her toe ulcer. She's been tolerating the dressing changes without complication. With that being said it does appear that she is ready and in fact I think it's appropriate for [place] to go ahead and initiate the total contact cast today. Nonetheless she will require some sharp debridement to prepare the wound for application. Overall I feel like things have been progressing well but we do need to do something to get this to close more readily. 08/24/18 patient seen today for reevaluation after having had the total contact cast applied on Tuesday. She seems to have done very well the wound appears to be doing great and overall I'm pleased with the progress that she's made. There were no abnormal areas of rubbing from the cast on her lower extremity. 08/30/18 on evaluation today patient actually appears to be completely healed in regard to her plantar toe ulcer. She tells me at this point she's been having [name] lot of issues with the cast. She almost fell [name] couple of times the state shall the step of her dog [name] couple times as well. This is been [name] very frustrating process for her other nonetheless she has completely healed the wound which is excellent news. Overall there does not appear to be the evidence of infection at this time which is great news. 09/11/18 evaluation today patient presents for follow-up concerning her great toe ulcer on the left which has unfortunately reopened since I last saw her which was only [name] couple of weeks ago. Unfortunately she was not able to get in to get the shoe and potentially the AFO that's gonna be necessary due to her left foot drop. She continues with offloading shoe but this is not  enough to  prevent her from reopening it appears. When we last had her in the total contact cast she did well from [name] healing standpoint but unfortunately the wound reopened as soon as she came out of the cast within just [name] couple of weeks. 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 This is also part of the reason why she falls on [name] regular basis. I just do not believe that is gonna be safe for [place] to reinitiate the total contact cast as last time we had this on she fell 3 times one week which is definitely not normal for her. 09/18/18 upon evaluation today the patient actually appears to be doing about the same in regard to her toe ulcer. She did not contact [employer] as I asked her to even though I had given her the prescription. In fact she actually states that she has no idea where the prescription is. She did apparently call [employer] and they told her that all she needed to do was bring the prescription in order to be able to be seen and work on getting the AFO for her left foot. With all that being said she still does not have an appointment and I'm not sure were things stand that regard. I will give her [name] new prescription today in order to contact them to get this set up. 09/25/18 on evaluation today patient actually appears to be doing about the same in regard to her toes ulcer. She does have [name] small areas which seems to have [name] lot of callous buildup around the edge of the wound which is going to need sharp debridement today. She still is waiting to be scheduled for evaluation with [employer] for possibility of an AFO. She states there supposed to call her tomorrow to get this set up. Unfortunately it does appear that her foot specifically the toe area is showing signs of erythema. There does not appear to be any  systemic infection which is in these good news. 10/02/18 on evaluation today patient actually appears to be doing about the same in regard to her toe ulcer. This really has not done too well although it's not significantly larger it's also not significantly smaller. She has been tolerating the dressing changes without complication. She actually has her appointment with [employer] and [place] tomorrow to hopefully be measured for obtaining and AFO splint. I think this would be helpful preventing this from reoccurring. We had contemplated starting the cast this week although to be honest I am reluctant to do that as she's been having nausea, vomiting, and seizure activity over the past three days. She has [name] history of seizures and have been told is nothing that can be done for these. With that being said I do believe that along with the seizures have the nausea vomiting which upon further questioning doesn't seem to be the normal for her and makes me concerned for the possibility of infection or something else going on. I discussed this with the patient and her mother during the office visit today. I do not feel the wound is effective but maybe something else. The responses this was "this just happens to her at times and we don't know why". They did not seem to be interested in going to the  hospital to have this checked out further. 10/09/18 on evaluation today patient presents for follow-up concerning her ongoing toe ulcer. She has been tolerating the dressing changes without complication. Fortunately there does not appear to be any evidence of infection which is great news however I do think that the patient would benefit from going ahead for with the total contact cast. She's actually in [name] wheelchair today she tells me that she will use her walker if we initiate the cast. I was very specific about the fact that if we were gonna do the cast I wanted to make sure that she was using the walker in order to  prevent any falls. She tells me she does not have stairs that she has to traverse on [name] regular basis at her home. She has not had any seizures since last week again that something that happens to her often she tells me she did talk to [employer] and they said that it may take up to three weeks to get the brace approved for her. Hopefully that will not take that long but nonetheless in the meantime I do think the cast could be of benefit. 10/12/18 on evaluation today patient appears to be doing rather well in regard to her toe ulcer. It's just been [name] few days and already this is significantly improved both as far as overall appearance and size. Fortunately there's no sign of infection. She is here for her first obligatory cast change. 10/19/18 Seen today for follow up and management of left great toe ulcer. Wound continues to show improvement. Noted small open area with seroussang drainage with palpation. Denies any increased pain or recent fevers during visit. She will continue calcium alginate with offloading shoe. Denies any questions or concerns during visit. 10/26/18 on evaluation today patient appears to be doing about the same as when I last saw her in regard to her wound bed. Fortunately there does not appear to be any signs of infection. Unfortunately she continues to have [name] breakdown in regard to the toe region any time that she is not in the cast. It takes almost no time at all for this to happen. Nonetheless she still has not heard anything from the brace being made by [employer] as to when exactly this will be available to her. Fortunately there is no signs of infection at this time. 10/30/18 on evaluation today patient presents for application of the total contact cast as we just received him this morning. Fortunately we are gonna be able to apply this to her today which is great news. She continues to have no significant pain which is good news. Overall I do feel like things have been  improving while she was the cast is when she doesn't have [name] cast that things get worse. She still has not really heard anything from [employer] regarding her brace. 11/02/18 upon evaluation today patient's wound already appears to be doing significantly better which is good news. Fortunately there does not appear to be any signs of infection also good news. Overall I do think the total contact cast as before is helping to heal this area unfortunately it's just not gonna likely keep the area closed and healed without her getting her brace at least. Again the foot drop is [name] significant issue for her. 11/09/18 on evaluation today patient appears to be doing excellent in regard to her toe ulcer which in fact is completely healed. Fortunately we finally got the situation squared away with the paperwork which was  needed to proceed with getting her brace approved by Medicaid. I have filled that out unfortunately that information has been sent to the orthopedic office that I worked at 2 1/2 years ago and not tired Current wound care measures. Fortunately she seems to be doing very well at this time. 11/23/18 on evaluation today patient appears to be doing More Poorly Compared to Last Time I Saw Her. At [facility] She Had Completely Healed. Currently she is continuing to have issues with reopening. She states that she just found out that the brace was approved through Medicaid now she just has to go get measured in order to have this fitted for her and then made. Subsequently she does not have an appointment for this yet that is going to complicate things we obviously cannot put her back in the cast if we do not have everything measured because they're not gonna be able to measure her foot while she is in the cast. 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 Obviously this is unfortunate and does have me somewhat  worried as well. 11/30/18 on evaluation today patient's toe ulcer actually appears to be doing fairly well. The good news is she will be getting her brace in the shoes next week on Wednesday. Hopefully we will be able to get this to heal without having to go back in the cast however she may need the cast in order to get the wound completely heal and then go from there. Fortunately there's no signs of infection at this time. 12/07/18 on evaluation today patient fortunately did receive her brace and she states she could tell this definitely makes her walk better. With that being said she's been having issues with her toe where she noticed yesterday there was [name] lot of tissue that was loosing off this appears to be much larger than what it JULEESA, PAVEL [name] ([id number]) [id number] Page 13 of 20 was previous. She also states that her leg has been read putting much across the top of her foot just about the ankle although this seems to be receiving somewhat. The total area is still red and appears to be someone infected as best I can tell. She is previously taken Bactrim and that may be [name] good option for her today as well. We are gonna see what I wound culture shows as well and I think that this is definitely appropriate. With that being said outside of the culture I still need to initiate something in the interim and that's what I'm gonna go ahead and select Bactrim is [name] good option for her. 12/14/18 on evaluation today patient appears to be doing better in regard to her left great toe ulcer as compared to last week's evaluation. There's still some erythema although this is significantly improved which is excellent news. Overall I do believe that she is making good progress is still gonna take some time before she is where I would like her to be from the standpoint of being able to place her back into the total contact cast. Hopefully we will be where we need to be by next  week. 12/21/18 on evaluation today patient actually appears to be doing poorly in regard to her toe ulcer. She's been tolerating the dressing changes without complication. Fortunately there's no signs of systemic infection although she does have [name] lot of drainage from the toe ulcer and  this does seem to be causing some issues at this point. She does have erythema on the distal portion of her toe that appears to be likely cellulitis. 12/28/18 on evaluation today patient actually appears to be doing [name] little better in my pinion in regard to her toe ulcer. With that being said she still does have some evidence of infection at this time and for her culture she had both E. coli as well as enterococcus as organisms noted on evaluation. For that reason I think that though the Keflex likely has treated the E. coli rather well this has really done nothing for the enterococcus. We are going to have to initiate treatment for this specifically. 01/04/19 on evaluation today patient's toe actually appears to be doing better from the standpoint of infection. She currently would like to see about putting the cash back on I think that this is appropriate as long as she takes care of it and keeps it from getting wet. She is gonna have some drainage we can definitely pass this up with Drawtex and alginate to try to prevent as much drainage as possible from causing the problems. With that being said I do want to at least try her with the cast between now and Tuesday. If there any issues we can't continue to use it then I will discontinue the use of the cast at that point. 01/08/19 on evaluation today patient actually appears to be doing very well as far as her foot ulcer specifically the great toe on the left is concerned. She did have an area of rubbing on the medial aspect of her left ankle which again is from the cast. Fortunately there's no signs of infection at this point in this appears to be [name] very slight skin  breakdown. The patient tells me she felt it rubbing but didn't think it was that bad. Fortunately there is no signs of active infection at this time which is good news. No fevers, chills, nausea, or vomiting noted at this time. 01/15/19 on evaluation today patient actually appears to be doing well in regard to her toe ulcer. Again as previous she seems to do well and she has the cast on which indicates to me that during the time she doesn't have [name] cast on she's putting way too much pressure on this region. Obviously I think that's gonna be an issue as with the current national emergency concerning the Covid-19 Virus it has been recommended that we discontinue the use of total contact casting by the chief medical officer of our company, Dr. Maurine Minister. The reasoning is that if [name] patient becomes sick and cannot come into have the cast removed they could not just leave this on for an additional two weeks. Obviously the hospitals also do not want to receive patient's who are sick into the emergency department to potentially contaminate the region and spread the Covid-19 Virus among other sick individuals within the hospital system. Therefore at this point we are suspending the use of total contact cast until the current emergency subsides. This was all discussed with the patient today as well. 01/22/19 on evaluation today patient's wound on her left great toe appears to be doing slightly worse than previously noted last week. She tells me that she has been on this quite [name] bit in fact she tells me she's been awake for 38 straight hours. This is due to the fact that she's having to care for grandparents because nobody else will. She has been taking  care of them for five the last seven days since I've seen her they both have dementia his is from [name] stroke and her grandmother's was progressive. Nonetheless she states even her mom who knows her condition and situation has only help two of those days to take care  of them she's been taking care of the rest. Fortunately there does not appear to be any signs of active infection in regard to her toe at this point although obviously it doesn't look as good as it did previous. I think this is directly related to her not taking off the pressure and friction by way of taking things easy. Though I completely understand what's going on. 01/29/19 on evaluation today patient's tools are actually appears to be showing some signs of improvement today compared to last week's evaluation as far as not necessarily the overall size of the wound but the fact that she has some new skin growth in between the two ends of the wound opening. Overall I feel like she has done well she states that she had [name] family member give her what sounds to be [name] CAM walker boot which has been helpful as well. 02/05/19 on evaluation today patient's wound bed actually appears to be doing significantly better in regard to her overall appearance of the size of the wound. With that being said she is still having an issue with offloading efficiently enough to get this to close. Apparently there is some signs of infection at this point as well unfortunately. Previously she's done well of Augmentin I really do not see anything that needs to be culture currently but there theme and cellulitis of the foot that I'm seeing I'm gonna go ahead and place her on an antibiotic today to try to help clear this up. 02/12/2019 on evaluation today patient actually appears to be doing poorly in regard to her overall wound status. She tells me she has been using her offloading shoe but actually comes in today wearing her tennis shoe with the AFO brace. Again as I previously discussed with her this is really not sufficient to allow the area to heal appropriately. Nonetheless she continues to be somewhat noncompliant and I do wonder based on what she has told my nurse in the past as to whether or not [name] good portion of this  noncompliance may be recreational drug and alcohol related. She has had [name] history of heroin overdose and this was fairly recently in the past couple of months that have been seeing her. Nonetheless overall I feel like her wound looks significantly worse today compared to what it was previous. She still has significant erythema despite the Augmentin I am not sure that this is an appropriate medication for her infection I am also concerned that the infection may have gone down into her bone. 02/19/19 on evaluation today patient actually appears to be doing about the same in regard to her toe ulcer. Unfortunately she continues to show signs of bone exposure and infection at this point. There does not appear to be any evidence of worsening of the infection but I'm also not really sure that it's getting significantly better. She is on the Augmentin which should be sufficient for the Staphylococcus aureus infection that she has at this point. With that being said she may need IV antibiotics to more appropriately treat this. We did have [name] discussion today about hyperbaric option therapy. 02/28/19 on evaluation today patient actually appears to be doing much worse in regard to  the wound on her left great toe as compared to even my previous evaluation last week. Unfortunately this seems to be training in [name] pretty poor direction. Her toe was actually now starting to angle laterally and I can actually see the entire joint area of the proximal portion of the digit where is the distal portion of the digit again is no longer even in contact with the joint line. Unfortunately there's [name] lot more necrotic tissue around the edge and the toe appears to be showing signs of becoming gangrenous in my pinion. I'm very concerned about were things stand at this point. She did see infectious disease and they are planning to send in [name] prescription for Sivextro for her and apparently this has been approved. With that being said I  don't think she should avoid taking this but at the same time I'm not sure that it's gonna be sufficient to save her toe at this point. She tells me that she still having to care for grandparents which I think is putting quite [name] bit of strain on her foot and specifically the total area and has caused this to break down even to [name] greater degree than would've otherwise been expected. 03/05/19 on evaluation today patient actually appears to be doing quite well in regard to her toe all things considering. She still has bone exposed but there appears to be much less your thing on overall the appearance of the wound and the toe itself is dramatically improved. She still does have some issues currently obviously with infection she did see vascular as well and there concerned that her blood flow to the toad. For that reason they are setting up for an angiogram next week. 03/14/19 on evaluation today patient appears to be doing very poor in regard to her toe and specifically in regard to the ulceration and the fact that she's starting to notice the toe was leaning even more towards the lateral aspect and the complete joint is visible on the proximal aspect of the joint. Nonetheless she's also noted [name] significant odor and the tip of the toe is turning more dark and necrotic appearing. Overall I think she is getting worse not better as far as this is concerned. For that reason I am recommending at this point that she likely needs to be seen for likely amputation. READMISSION 03/19/2021 This is [name] patient that we cared for in this clinic for [name] prolonged period of time in 2019 and 2020 with [name] left foot and left first toe wound. 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 Excell Seltzer. In December 2021 she had an ulcer on her right great toe as well as the fourth and fifth toes. She underwent [name] partial ray  amputation of the right fourth and fifth [name], [name] ([id number]) [id number] Page 14 of 20 toes. She also had an angiogram at that time and underwent angioplasty of the right anterior tibial artery. In any case she claims that the wound on the right foot is closed I did not look at this today which was probably an oversight although I think that should be done next week. 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 According to Dr. Bernette Redbird last review that she was out of the area being cared for by another physician but recently came back to his attention.  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 [name] culture of this area showed E. coli apparently before she came back to see Dr. Excell Seltzer she was supposed to be receiving antibiotics but she did not really take them. Nor is she offloading this area at all. Finally her last hemoglobin A1c listed in epic was in March 2022 at 14.1 she says things are [name] lot better since then although I am not sure. 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 She had this wound at the time. During this admission she had E. coli sepsis [name] TEE was negative. 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 She is still [name] heavy cigarette smoker. She has [name] history of bipolar disorder seizure disorder ABI in our clinic was 1.05 6/1; left midfoot in the setting of [name] TMA done previously. Round circular wound with [name] "knuckle" of protruding tissue. The problem is that the knuckle was not attached to any of the surrounding granulation and this probed proximally widely I  removed [name] large portion of this tissue. This wound goes with considerable undermining laterally. I do not feel any bone there was no purulence but this is [name] deep wound. 6/8; in spite of the debridement I did last week. She arrives with [name] wound looking exactly the same. [name] protruding "knuckle" of tissue nonadherent to most of the surrounding tissue. There is considerable depth around this from 6-12 o'clock at 2.7 cm and undermining of 1 cm. This does not look overtly infected and the x- ray I did last week was negative for any osseous abnormalities. We have been using silver collagen 6/15; deep tissue culture I did last week showed moderate staph aureus and moderate Pseudomonas. This will definitely require prolonged antibiotic therapy. The pathology on the protuberant area was negative for malignancy fungus etc. 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 We have been using silver collagen. I am going to be prescribing Levaquin for 2 weeks. Her CT scan of the foot is down for 7/5 6/22; CT scan of the foot on 7 5. She says she has hardware in the left leg from her previous fracture. She is on the Levaquin for the deep tissue culture I did that showed methicillin sensitive staph aureus and Pseudomonas. I gave her [name] 2-week supply and she will have another week. She arrives in clinic today with the same protuberant tissue however this is nonadherent to the tissue surrounding it. I am really at [name] loss to explain this unless there is underlying deep tissue infection 6/29; patient presents for 1 week follow-up. She has been using collagen to the wound bed. She reports taking her antibiotics as prescribed.She has no complaints or issues today. She denies signs of infection. 7/6; patient presents for one week followup. She has been using collagen to the wound bed. She states she is taking Levaquin however at times she is not able to keep it down. She  denies signs of infection. 7/13; patient presents for 1 week follow-up. She has been using silver alginate to the wound bed. She still has nausea when taking her antibiotics. She denies signs of infection. 7/20; patient presents for 1 week follow-up. She has  been using silver alginate with gentamicin cream to the wound bed. She denies any issues and has no complaints today. She denies signs of infection. 7/27; patient presents for 1 week follow-up. She continues to use silver alginate with gentamicin cream to the wound bed. She reports starting her antibiotics. She has no issues or complaints. Overall she reports stability to the wound. 8/3; patient presents for 1 week follow-up. She has been using silver alginate with gentamicin cream to the wound bed. She reports completing all antibiotics. She has no issues or complaints today. She denies signs of infection. 8/17; patient presents for 2-week follow-up. He is to use silver alginate to the wound bed. She has no issues or complaints today. She denies signs of infection. She reports her pain has improved in her foot since last clinic visit 8/24; patient presents for 1 week follow-up. She continues to use silver alginate to the wound bed. She has no issues or complaints. She denies signs of infection. Pain is stable. 9/7; patient presents for follow-up. She missed her last week appointment due to feeling ill. She continues to use silver alginate. She has [name] new wound to the right lower extremity that is covered in eschar. She states It occurred over the past week and has no idea how it started. She currently denies signs of infection. 9/14; patient presents for follow-up. T the left foot wound she has been using gentamicin cream and silver alginate. T the right lower extremity wound she has o o been keeping this covered and has not obtain Santyl. 9/21; patient presents for follow-up. She reports using gentamicin cream and silver alginate to the left  foot and Santyl to the right lower extremity wound. She has no issues or complaints today. She denies signs of infection. 9/28; patient presents for follow-up. She reports [name] new wound to her right heel. She states this occurred [name] few days ago and is progressively gotten worse. She has been trying to clean the area with [name] Q-tip and Santyl. She reports stability in the other 2 wounds. She has been using gentamicin cream and silver alginate to the left foot and Santyl to the right lower extremity wound. 10/12; patient presents for follow-up. She reports improvement to the wound beds. She is seeing vein and vascular to discuss the potential of [name] left BKA. She states they are going to do an arteriogram. She continues to use silver alginate with dressing changes to her wounds. 11/2; patient presents for follow-up. She states she has not been doing dressing changes to the wound beds. She states she is not able to offload the areas. She reports chronic pain to her left foot wound. 11/9; patient presents for follow-up. She came in with only socks on. She states she forgot to put on shoes. It is unclear if she is doing any dressing changes. She currently denies systemic signs of infection. 11/16; patient presents for follow-up. She came again only with socks on. She states she does not wear shoes ever. It is unclear if she does dressing changes. She currently denies systemic signs of infection. 11/23; patient presents for follow-up. She wore her shoes today. It still unclear exactly what dressing she is using for each wound but she did states she obtained Dakin's solution and has been using this to the left foot wound. She currently denies signs of infection. 11/30; patient presents for follow-up. She has no issues or complaints today. She currently denies signs of infection. 12/7; patient presents for follow-up. She  has no issues or complaints today. She has been using Hydrofera Blue to the right heel wound  and Dakin solution to the left foot wound. Her right anterior leg wound is healed. She currently denies signs of infection. 12/14; patient presents for follow-up. She has been using Hydrofera Blue to the right heel and Dakin's to the left foot wounds. She has no issues or complaints today. She denies signs of infection. 12/21; patient presents for follow-up. She reports using Hydrofera Blue to the right heel and Dakin's to the left foot wound. She denies signs of infection. 12/28; patient presents for follow-up. She continues to use Dakin's to the left foot wound and Hydrofera Blue to the right heel wound. She denies signs of infection. 1/4; patient presents for follow-up. She has no issues or complaints today. She denies signs of infection. 1/11; patient presents for follow-up. It is unclear if she has been dressing these wounds over the past week. She currently denies signs of infection. 1/18; patient presents for follow-up. She states she has been using Dakin's wet-to-dry dressings to the left foot. She has been using Hydrofera Blue to the right foot foot wound. She states that the anterior right leg wound has reopened and draining serous fluid. She denies signs of infection. JANAII, [place] [name] ([id number]) [id number] Page 15 of 20 1/25; patient presents for follow-up. She has no issues or complaints today. 2/1; patient presents for follow-up. She has no issues or complaints today. She denies signs of infection. 2/8; patient presents for follow-up. She has lost her surgical shoes. She did not have [name] dressing to the right heel wound. She currently denies signs of infection. 2/15; patient presents for follow-up. She reports more pain to the right heel today. She denies purulent drainage Or fever/chills 2/22; patient presents for follow-up. She reports taking clindamycin over the past week. She states that she continues to have pain to her right heel. She reports purulent  drainage. Readmission 03/16/2022 Ms. Halynn Polinsky is [name] 47 year old female with [name] past medical history of type 2 diabetes, osteomyelitis to her feet, chronic systolic heart failure and bipolar disorder that presents to the clinic for bilateral feet wounds and right lower extremity wound. She was last seen in our clinic on 12/15/2021. At that time she had purulent drainage coming out of her right plantar foot and I recommended she go to the ED. She states she went to [facility] and has been there for the past 3 months. I cannot see the records. She states she had OR debridement and was on several weeks of IV antibiotics while inpatient. Since discharge she has not been taking care of the wound beds. She had nothing on her feet other than socks today. She currently denies signs of infection. 5/31; patient presents for follow-up. She has been using Dakin's wet-to-dry dressings to the wound beds on her feet bilaterally and antibiotic ointment to the right anterior leg wound. She had [name] wound culture done at last clinic visit that showed moderate Pseudomonas aeruginosa sensitive to ciprofloxacin. She currently denies systemic signs of infection. 6/14; patient presents for follow-up. She received Keystone 5 days ago and has been using this on the wound beds. She states that last week she had to go to the hospital because she had increased warmth and erythema to the right foot. She was started on 2 oral antibiotics. She states she has been taking these. She currently denies systemic signs of infection. She has no issues or complaints today. 6/21;  patient presents for follow-up. She states she has been using Keystone antibiotics to the wound beds. She has no issues or complaints today. She denies signs of infection. 6/28; patient presents for follow-up. She has been using Keystone antibiotics to the wound beds. She has no issues or complaints today. 7/12; patient presents for follow-up. Has been  using Keystone antibiotics to the wound beds with calcium alginate. She has no issues or complaints today. She never followed up with her orthopedic surgeon who did the OR debridement to the right foot. We discussed the total contact cast for the left foot and patient would like to do this next week. 7/19; patient presents for follow-up. She has been using Keystone antibiotics with calcium alginate to the wound beds. She has no issues or complaints today. Patient is in agreement to do the total contact cast of the left foot today. She knows to return later this week for the obligatory cast change. 05-13-2022 upon evaluation today patient's wound which she has the cast of the left leg actually appears to be doing significantly better. Fortunately I do not see any signs of active infection locally or systemically which is great news and overall I am extremely pleased with where we stand currently. 7/26; patient presents for follow-up. She has [name] cast in place for the past week. She states it irritated her shin. Other than that she tolerated the cast well. She states she would like [name] break for 1 week from the cast. We have been using Keystone antibiotic and Aquacel to both wound beds. She denies signs of infection. 8/2; patient presents for follow-up. She has been using Keystone and Aquacel to the wound beds. She denies any issues and has no complaints. She is agreeable to have the cast placed today for the left leg. 06-03-2022 upon evaluation today patient appears to be doing well with regard to her wound she saw some signs of improvement which is great news. Fortunately I do not see any evidence of active infection locally or systemically at this time which is great news. No fevers, chills, nausea, vomiting, or diarrhea. 8/16; patient presents for follow-up. She has no issues or complaints today. We have been using Keystone and Aquacel to the wound beds. The left lower extremity is in [name] total contact  cast. She is tolerated this well. 8/23; patient presents for follow-up. She has had the total contact cast on the left leg for the past week. Unfortunately this has rubbed and broken down the skin to the medial foot. She currently denies signs of infection. She has been using Keystone antibiotic to the right plantar foot wound. 8/30; patient presents for follow-up. We have held off on the total contact cast for the left leg for the past week. Her wound on the left foot has improved and the previous surrounding breakdown of skin has epithelialized. She has been using Keystone antibiotic to both wound beds. She has no issues or complaints today. She denies signs of infection. 9/6; patient presents for follow-up. She has ordered her's Keystone antibiotic and this is arriving this week. She has been doing Dakin's wet-to-dry dressings to the wound beds. She denies signs of infection. She is agreeable to the total contact cast today. 9/13; patient presents for follow-up. She states that the cast caused her left leg shin to be sore. She would like to take [name] break from the cast this week. She has been using Keystone antibiotic to the right plantar foot wound. She denies signs of  infection. 9/20; patient presents for follow-up. She has been using Keystone antibiotics to the wound beds with calcium alginate to the right foot wound and Hydrofera Blue to the left foot wound. She is agreeable to having the cast placed today. She has been approved for Apligraf and we will order this for next clinic visit. 9/27; patient presents for follow-up. We have been using Keystone antibiotic with Hydrofera Blue to the left foot wound under [name] total contact cast. T the right o foot wound she has been using Keystone antibiotic and calcium alginate. She declines [name] total contact cast today. Apligraf is available for placement and she would like to proceed with this. 07-28-2022 upon evaluation today patient appears to be doing well  currently in regard to her wound. She is actually showing signs of significant improvement which is great news. Fortunately I do not see any evidence of active infection locally nor systemically at this time. She has been seeing Dr. Mikey Bussing and to be honest has been doing very well with the cast. Subsequently she comes in today with [name] cast on and we did reapply that today as well. She did not really want to she try to talk me out of that but I explained that if she wanted to heal this is really the right way to go. Patient voiced understanding. In regard to her right foot this is actually [name] lot better compared to the last time I saw her which is also great news. 10/11; patient presents for follow-up. Apligraf and the total contact cast was placed to the left leg at last clinic visit. She states that her right foot wound had burning pain to it with the placement of Apligraf to this area. She has been doing [place] over this area. She denies signs of infection including increased warmth, erythema or purulent drainage. 11/1; 3-week follow-up. The patient fortunately did not have [name] total contact cast or an Apligraf and on the left foot. She has been using Keystone ABD pads and kerlix and her own running shoes She arrives in clinic today with thick callus and [name] very poor surface on the left foot on the right nonviable skin subcutaneous tissue and [name] deep probing hole. 11/15; patient missed her last clinic appointment. She states she has not been dressing the wound beds for the past 2 weeks. She states that at she had [name] new ILSA, GILLEM [name] ([id number]) [id number] Page 16 of 20 roommate but is now going back to live with her mother. Apparently its been [name] distracting 2 weeks. Patient currently denies signs of infection. 11/22; patient presents for follow-up. She states she has been using Keystone antibiotic and Dakin's wet-to-dry dressings to the wound beds. She is agreeable for  cast placement today. We had ordered Apligraf however this has not been received by our facility. 11/29; Patient had [name] total contact cast placed at last clinic visit and she tolerated this well. We were using silver alginate under the cast. Patient's been using Keystone antibiotic with Aquacel to the right plantar foot wound. She has no issues or complaints today. Apligraf is available for placement today. Patient would like to proceed with this. 12/6; patient presents for follow-up. She had Apligraf placed in standard fashion last clinic visit under the total contact cast to the left lower extremity. She has been using Keystone antibiotic and Aquacel to the right plantar foot wound. She has no issues or complaints today. 12/13; patient presents for follow-up. She has finished 5 Apligraf placements. Was  told she would not qualify for more. We have been doing [name] total contact cast to the left lower extremity. She has been using Keystone antibiotic and Aquacel to the right plantar foot wound. She has no issues or complaints today. 12/20; patient presents for follow-up. We have been using Hydrofera Blue with Keystone antibiotic under [name] total contact cast of the left lower extremity. She reports using Keystone antibiotic and silver alginate to the right heel wound. She has no issues or complaints today. 12/27; patient presents with [name] healthy wound on the left midfoot. We have Apligraf to apply that to that more also using [name] total contact cast. On the right we are using Keystone and silver alginate. She is offloading the right heel with [name] surgical shoealthough by her admission she is on her feet quite [name] bit 1/3; patient presents for follow-up. Apligraf was placed to the wound beds last clinic visit. She was placed in [name] total contact cast to the left lower extremity. She declines [name] total contact cast today. She states that her mother is in the hospital and she cannot adequately get around with the cast  on. 1/10; patient presents for follow-up. She declined the total contact cast at last clinic visit. Both wounds have declined in appearance. She states that she has been on her feet and not offloading the wound beds. She currently denies signs of infection. 1/17; patient presents for follow-up. She had the total contact cast along with Apligraf placed last week to the left lower extremity. She tolerated this well. She has been using Aquacel Ag and Keystone antibiotic to the right heel wound. She currently denies signs of infection. She has no issues or complaints today. 1/24; patient presents for follow-up. We have been using Apligraf to the left foot wound along with [name] total contact cast. She has done well with this. T the right o heel wound she has been using Aquacel Ag and Keystone antibiotic ointment. She has no issues or complaints today. She denies signs of infection. 1/31; patient presents for follow-up. We have been using Apligraf to the left foot wound along with the total contact cast. She continues to do well with this. To the right heel we have been using Aquacel Ag and Keystone antibiotic ointment. She has no issues or complaints today. 12-01-2022 upon evaluation patient is seen today on my schedule due to the fact that she unfortunately was in the hospital yesterday. Her cast needed to come off the only reason she is out of the hospital is due to the fact that they would not take it off at the hospital which is somewhat bewildered reading to me to be perfectly honest. I am not certain why this was but either way she was released and then was placed on my schedule today in order to get this off and reapply the total contact casting as appropriate. I do not have an Apligraf for her today it was applied last week and today's actually expired yesterday as there was some scheduling conflicts with her being in the hospital. Nonetheless we do not have that for reapplication today but the good news  that she is not draining too much and the Apligraf can go for up to 2 weeks so I am going to go ahead and reapply the total contact casting but we are going to leave the Apligraf in place. 2/14; patient presents for follow-up. T the left leg she has had the total contact cast and Apligraf for the past  week. She has had no issues with this. T the o o right heel she has been using antibiotic ointment and Aquacel Ag. 2/21; patient presents for follow-up. We have been using Apligraf and [name] total contact cast to the left lower extremity. She is tolerated this well. Unfortunately she is not approved for any more Apligraf per insurance. She has been using antibiotic ointment and Aquacel Ag to the right foot. She has no issues or complaints today. 2/28; patient presents for follow-up. We have been using Hydrofera Blue and antibiotic ointment under the total contact cast to the left lower extremity. He has been using Aquacel Ag with antibiotic ointment to the right plantar foot. She has no issues or complaints today. 3/6; patient presents for follow-up. She did not obtain her gentamicin ointment. She has been using Aquacel Ag to the right plantar foot wound. We have been using Hydrofera Blue with antibiotic ointment under the total contact cast to the left lower extremity. She has no issues or complaints today. 3/12; patient presents for follow-up. She has been using gentamicin ointment and Aquacel Ag to the right plantar foot wound. We have been using Hydrofera Blue with antibiotic ointment under the total contact cast on the left lower extremity foot wound. She has no issues or complaints today. 3/20; patient presents for follow-up. She has been using gentamicin ointment and Aquacel Ag to the right plantar foot wound. We have been using Hydrofera Blue with antibiotic ointment under the total contact cast to the left lower extremity wound. She has no issues or complaints today. 3/27; patient presents for  follow-up. We have been using antibiotic ointment and Aquacel Ag to the right plantar foot wound. We have been using Hydrofera Blue with antibiotic ointment under the total contact cast to the left lower extremity. Both wounds are smaller. 01-24-2023 upon evaluation today patient actually appears to be doing better compared to last time I saw her. It has been several weeks since I last saw her but nonetheless I think the total contact casting is doing [name] good job. Fortunately I do not see any evidence of infection or worsening overall which is great news and in general I do believe that she is moving in the appropriate direction. It has been [name] very long road but nonetheless I feel like she is nearing the end at least in regard to the left foot and even the right foot looks much better to be perfectly honest. 01-31-2023 upon evaluation today patient appears to be doing well currently in regard to her wounds. She has been require some sharp debridement which I think is probably bone both sides to remove [name] lot of callus that has built up at this point. I discussed that with her today. Also think total contact cast is doing well for the left foot Emina continue that as well. 4/17; patient presents for follow-up. We have been doing Hydrofera Blue with antibiotic ointment under the total contact cast to the left lower extremity and Aquacel Ag with antibiotic ointment to the right foot wound. She has been using her offloading heel shoe here. 4/24; patient presents for follow-up. We have been doing Hydrofera Blue with antibiotic ointment under the total contact cast to the left lower extremity and Aquacel Ag with antibiotic ointment to the right foot wound. She has been using her offloading heel shoe here. Wounds are smaller with slough accumulation. 5/1; patient presents for follow-up. We have been doing Hydrofera Blue with antibiotic ointment under the total contact  cast to the left lower extremity and Aquacel Ag  with antibiotic ointment to the right foot wound. Left foot wound is stable and right foot wound appears smaller. 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 On the left she is using Hydrofera Blue topical antibiotic under [name] total contact cast, on the right plantar heel gentamicin Aquacel Ag. According to our intake nurse both are making slow but steady improvements. 5/15; patient presents for follow-up. We have been doing Hydrofera Blue with antibiotic ointment under the total contact cast on the left and antibiotic ointment with Aquacel Ag on the right. Unfortunately the left foot wound has declined in size and appearance. We also do not have the total contact cast available in office today. Patient denies signs of infection. 5/22; patient presents for follow-up. We have been doing Hydrofera Blue and antibiotic ointment to the left foot and Aquacel Ag with antibiotic ointment to the right foot. We took [name] break from the cast since we did not have it placed at last week. Wounds look stable if not slightly improved. [name], [name] ([id number]) [id number] Page 17 of 20 03-27-2023 patient presents today she has had this For Most 2 Weeks Because She Was in the Hospital and They Did Not T It off. Again That Is Really Not ake Something That She Could Control but Nonetheless We Are Seeing Her at This Point Today for Reevaluation. We Do Want to Go and See about Reapply the Cast. Again We Had All Set Everything up to Go Ahead and Do This When We Went to Get the Cast Only to Realize That Not Had Come in and We Were out at the Moment. With That Being [place] Spaniel We Will Get Have T Get the Exam before We Can Reapply the T Contact Casting Going Forward. o otal 6/12; patient presents for follow-up. She has been using Hydrofera Blue to the left foot wound and Aquacel Ag to the right foot wound. No cast was available last week placed on the patient so she has been offloading with her surgical  shoe. She has no issues or complaints today. 6/19; patient presents for follow-up. We have been using Hydrofera Blue to the left foot wound under the total contact cast and Aquacel Ag to the right wound. She has no issues or complaints today. The wounds are smaller. 6/26; patient presents for follow-up. We have been using Hydrofera Blue to the left foot wound under the total contact cast and Aquacel Ag to the right foot wound. Wounds are smaller. Patient has no issues or complaints today. 7/3; patient presents for follow-up. We have been using Hydrofera Blue to the left foot wound under the total contact cast and Aquacel Ag to the right foot wound. Wounds Appear smaller. No signs of infection. 7/10; patient presents for follow-up. We have been using Hydrofera Blue to the left foot wound under the total contact cast and Aquacel Ag to the right foot wound. Left foot wound is smaller. Right foot wound is stable. 7/17; patient presents for follow-up. We have been using Hydrofera Blue to the left foot under the total contact cast and Aquacel Ag to the right foot wound. She has no issues or complaints today. 7/24; patient presents for follow-up. We have been using Hydrofera Blue to the left foot under the total contact cast and Aquacel Ag to the right foot wound. Unfortunately she was bit by her dog over the weekend and developed [name] wound to the right lower leg. She has been keeping  the area covered. 7/31; patient presents for follow-up. We have been using Hydrofera Blue to the left foot under the total contact cast and Aquacel Ag to the right foot wound and mupirocin ointment to the right anterior leg wound. She started her oral antibiotics 2 days ago. 8/7; patient presents for follow-up. We have been using Hydrofera Blue and antibiotic ointment to the left foot under the total contact cast and Aquacel Ag to the right foot wound. Antibiotic ointment to the right anterior leg wound. She completed her oral  antibiotics. She has no issues or complaints today. Wounds continue to improve albeit slowly. 8/14; 8/14; patient has 3 wounds 1 on the left plantar heel 1 on the right plantar heel and [name] small wound on the right anterior lower leg. We have been using Hydrofera Blue in the left heel Aquacel on the right heel. The left heel has been in [name] total contact cast 8/21; patient presents for follow-up. We have been using Hydrofera Blue to the left foot wound under the total contact cast. T the right heel patient has been o using Aquacel Ag. T the anterior leg wound antibiotic ointment. All wounds have healed. o Patient History Information obtained from Patient. Social History Current every day smoker, Marital Status - Single, Alcohol Use - Never, Drug Use - Prior History, Caffeine Use - Daily. Medical History Ear/Nose/Mouth/Throat Patient has history of Chronic sinus problems/congestion, Middle ear problems Hematologic/Lymphatic Patient has history of Anemia Respiratory Patient has history of Chronic Obstructive Pulmonary Disease (COPD) Cardiovascular Patient has history of Congestive Heart Failure - EF 33% Endocrine Patient has history of Type II Diabetes Genitourinary Patient has history of End Stage Renal Disease Integumentary (Skin) Patient has history of History of pressure wounds Neurologic Patient has history of Neuropathy - feel and legs Objective Constitutional Vitals Time Taken: 11:25 AM, Height: 69 in, Weight: 178 lbs, BMI: 26.3, Temperature: 98.5 F, Pulse: 90 bpm, Respiratory Rate: 18 breaths/min, Blood Pressure: 144/91 mmHg. General Notes: Epithelization to the previous wound sites on the left foot, right foot and right leg. No signs of infection. No drainage on palpation to the previous wound sites. Integumentary (Hair, Skin) Wound #11 status is Healed - Epithelialized. Original cause of wound was Surgical Injury. The date acquired was: 12/01/2021. The wound has been in  treatment 65 weeks. The wound is located on the Right,Plantar Foot. The wound measures 0cm length x 0cm width x 0cm depth; 0cm^2 area and 0cm^3 volume. There is [name] none present amount of drainage noted. The wound margin is distinct with the outline attached to the wound base. There is no granulation within the wound bed. There is no necrotic tissue within the wound bed. ESPERANZA, DOMINY [name] ([id number]) [id number] Page 18 of 20 Wound #12 status is Healed - Epithelialized. Original cause of wound was Pressure Injury. The date acquired was: 03/16/2020. The wound has been in treatment 65 weeks. The wound is located on the Left,Medial,Plantar Foot. The wound measures 0cm length x 0cm width x 0cm depth; 0cm^2 area and 0cm^3 volume. There is [name] none present amount of drainage noted. The wound margin is flat and intact. There is no granulation within the wound bed. There is no necrotic tissue within the wound bed. Wound #14 status is Healed - Epithelialized. Original cause of wound was Bite. The date acquired was: 05/14/2023. The wound has been in treatment 4 weeks. The wound is located on the Right,Anterior Lower Leg. The wound measures 0cm length x 0cm width x 0cm  depth; 0cm^2 area and 0cm^3 volume. There is Fat Layer (Subcutaneous Tissue) exposed. There is [name] none present amount of drainage noted. The wound margin is distinct with the outline attached to the wound base. There is no granulation within the wound bed. There is no necrotic tissue within the wound bed. 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 I still recommended aggressively offloading to the feet to ensure the wounds remain closed. She has surgical shoe with peg assist T the right foot and surgical shoe with offloading felt pad to the left foot. I recommended follow-up in 1 week T make sure the wounds remain healed. o o Plan Follow-up Appointments: Return Appointment in 1 week. Bathing/ Shower/ Hygiene: May shower; gently cleanse wound with antibacterial soap, rinse and pat dry prior to dressing wounds No tub bath. Off-Loading: Open toe surgical shoe - right, left Offloading felt to foot. - left foot Other: - felt offloading pad to left foot 1. Aggressive offloading 2. Follow-up in 1 week Electronic Signature(s) Signed: 06/20/2023 5:32:04 PM By: Elliot Gurney, BSN, RN, CWS, [name] RN, BSN Signed: 06/23/2023 12:11:16 PM By: Geralyn Corwin DO Previous Signature: 06/14/2023 12:05:18 PM Version By: Geralyn Corwin DO Entered By: Elliot Gurney BSN, RN, CWS, [name] on 06/20/2023 17:32:04 -------------------------------------------------------------------------------- ROS/PFSH Details Patient Name: Date of Service: Heather Jefferson [name]. 06/14/2023 11:00 [name]: [id number] Patient Account Number: [id number] Date of Birth/Sex: Treating RN: 12-Oct-1976 (47 y.o. [name] Primary Care Provider: [name] Other Clinician: [name] Referring Provider: Treating Provider/Extender: Masa, Briski [name] ([id number]) [id number] Page 19 of 20 Weeks in Treatment: 17 Information Obtained From Patient Ear/Nose/Mouth/Throat Medical History: Positive for: Chronic sinus problems/congestion; Middle ear problems Hematologic/Lymphatic Medical History: Positive for: Anemia Respiratory Medical History: Positive for:  Chronic Obstructive Pulmonary Disease (COPD) Cardiovascular Medical History: Positive for: Congestive Heart Failure - EF 33% Endocrine Medical History: Positive for: Type II Diabetes Time with diabetes: 14 years Treated with: Insulin, Oral agents Blood sugar tested every day: No Blood sugar testing results: Bedtime: 176 Genitourinary Medical History: Positive for: End Stage Renal Disease Integumentary (Skin) Medical History: Positive for: History of pressure wounds Neurologic Medical History: Positive for: Neuropathy - feel and legs HBO Extended History Items Ear/Nose/Mouth/Throat: Ear/Nose/Mouth/Throat: Chronic sinus Middle ear problems problems/congestion Immunizations Pneumococcal Vaccine: Received Pneumococcal Vaccination: No Implantable Devices No devices added Family and Social History Current every day smoker; Marital Status - Single; Alcohol Use: Never; Drug Use: Prior History; Caffeine Use: Daily Electronic Signature(s) Signed: 06/14/2023 12:05:18 PM By: Geralyn Corwin DO Signed: 06/23/2023 11:59:03 AM By: Yevonne Pax RN Entered By: Geralyn Corwin on 06/14/2023 11:59:17 [name] ([id number]) [id number] Page 20 of 20 -------------------------------------------------------------------------------- SuperBill Details Patient Name: Date of Service: CARLEAH, BARUT 06/14/2023 Medical Record Number: [id number] Patient Account Number: [id number] Date of Birth/Sex: Treating RN: 03/07/1976 (47 y.o. [name] Primary Care Provider: [name] Other Clinician: [name] Referring Provider: Treating Provider/Extender: Chapman Moss in Treatment: 39 Diagnosis Coding ICD-10  Codes Code Description 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ity 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.621 Type 2  diabetes mellitus with foot ulcer L97.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 of skin W54.0XXA Bitten by dog, initial encounter S81.801A Unspecified open wound, right lower leg, initial encounter E11.622 Type 2 diabetes mellitus with other skin ulcer E11.42 Type 2 diabetes mellitus with diabetic polyneuropathy Z79.4 Long term (current) use of insulin Facility Procedures : CPT4 Code: [id number] Description: 99213 - WOUND CARE VISIT-LEV 3 EST PT Modifier: Quantity: 1 Physician Procedures : CPT4 Code Description Modifier [id number] 99213 - WC PHYS LEVEL 3 - EST PT ICD-10 Diagnosis Description 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ity L97.512 Non-pressure chronic ulcer of other part of right  foot with fat layer exposed E11.621 Type 2 diabetes mellitus with foot ulcer L97.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 of skin Quantity: 1 Electronic Signature(s) Signed: 06/14/2023 12:05:18 PM By: Geralyn Corwin DO Entered By: Geralyn Corwin on 06/14/2023 11:58:17

## 2023-07-05 ENCOUNTER — Encounter: Payer: MEDICAID | Attending: Internal Medicine | Admitting: Internal Medicine

## 2023-07-05 DIAGNOSIS — L97522 Non-pressure chronic ulcer of other part of left foot with fat layer exposed: Secondary | ICD-10-CM | POA: Diagnosis not present

## 2023-07-05 DIAGNOSIS — E11621 Type 2 diabetes mellitus with foot ulcer: Secondary | ICD-10-CM | POA: Diagnosis not present

## 2023-07-05 DIAGNOSIS — I5022 Chronic systolic (congestive) heart failure: Secondary | ICD-10-CM | POA: Insufficient documentation

## 2023-07-05 DIAGNOSIS — L97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 of skin: Secondary | ICD-10-CM | POA: Diagnosis not present

## 2023-07-05 DIAGNOSIS — E1142 Type 2 diabetes mellitus with diabetic polyneuropathy: Secondary | ICD-10-CM | POA: Diagnosis not present

## 2023-07-05 DIAGNOSIS — E11622 Type 2 diabetes mellitus with other skin ulcer: Secondary | ICD-10-CM | POA: Diagnosis not present

## 2023-07-05 DIAGNOSIS — L97528 Non-pressure chronic ulcer of other part of left foot with other specified severity: Secondary | ICD-10-CM

## 2023-07-05 DIAGNOSIS — Z99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: Secondary | ICD-10-CM | POA: Diagnosis not present

## 2023-07-05 DIAGNOSIS — N186 End stage renal disease: Secondary | ICD-10-CM | POA: Insufficient documentation

## 2023-07-05 DIAGNOSIS — L97512 Non-pressure chronic ulcer of other part of right foot with fat layer exposed: Secondary | ICD-10-CM | POA: Insufficient documentation

## 2023-07-05 DIAGNOSIS — E1122 Type 2 diabetes mellitus with diabetic chronic kidney disease: Secondary | ICD-10-CM | POA: Diagnosis not present

## 2023-07-05 DIAGNOSIS — Z794 Long term (current) use of insulin: Secondary | ICD-10-CM | POA: Diagnosis not present

## 2023-07-07 NOTE — Progress Notes (Signed)
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 03/16/2022 No Yes of skin W54.0XXA Bitten by dog, initial encounter 05/17/2023 No Yes S81.801A Unspecified open wound, right lower leg, initial encounter 05/17/2023 No Yes E11.622  Type 2 diabetes mellitus with other skin ulcer 05/17/2023 No Yes E11.42 Type 2 diabetes mellitus with diabetic polyneuropathy 03/16/2022 No Yes Z79.4 Long term (current) use of insulin 02/22/2023 No Yes Inactive Problems Resolved Problems Electronic Signature(s) Signed: 07/05/2023 1:14:22 PM By: Geralyn Corwin DO Entered By: Geralyn Corwin on 07/05/2023 12:10:09 -------------------------------------------------------------------------------- Progress Note Details Patient Name: Date of Service: [name] 07/05/2023 11:00 A M Medical Record Number: [id number] Patient Account Number: [id number] Date of Birth/Sex: Treating RN: May 27, 1976 (47 y.o. [name] Primary Care Provider: [name] Other Clinician: [name] Referring Provider: Treating Provider/Extender: Chapman Moss in Treatment: 29 Subjective Chief Complaint Information obtained from Patient 03/19/2021; patient referred by Dr. Excell Seltzer who has been looking after her left foot for quite a period of time for review of a nonhealing area in the left midfoot 03/12/2022; bilateral feet wounds and right lower extremity wound. History of Present Illness (HPI) 01/18/18-She is here for initial evaluation of the left great toe ulcer. She is a poor historian in regards to timeframe in detail. She states approximately 4 weeks ago she lacerated her toe on something in the house. She followed up with her primary care who placed her on Bactrim and ultimately a second dose of Bactrim prior to coming to wound clinic. She states she has been treating the toe with peroxide, Betadine and a Band-Aid. She did not check her blood sugar this morning but checked it yesterday morning it was 327; she is unaware of a recent A1c and there are no current records. She saw Dr. she would've orthopedics last week for an old injury to the left ankle, she states he did not see her toe, nor did she bring it to his attention.  She smokes approximately 1 pack cigarettes ZYLA, ZARZA A ([id number]) [id number] Page 12 of 22 a day. Her social situation is concerning, she arrives this morning with her mother who appears extremely intoxicated/under the influence; her mother was asked to leave the room and be monitored by the patient's grandmother. The patient's aunt then accompanied the patient and the room throughout the rest of the appointment. We had a lengthy discussion regarding the deleterious effects of uncontrolled hyperglycemia and smoking as it relates to wound healing and overall health. She was strongly encouraged to decrease her smoking and get her diabetes under better control. She states she is currently on a diet and has cut down her [facility] consumption. The left toe is erythematous, macerated and slightly edematous with malodor present. The edema in her left foot is below her baseline, there is no erythema streaking. We will treat her with Santyl, doxycycline; we have ordered and xray, culture and provided a Peg assist surgical shoe and cultured the wound. 01/25/18-She is here in follow-up evaluation for a left great toe ulcer and presents with an abscess to her suprapubic area. She states her blood sugars remain elevated,  oral antibiotics. She has no issues or complaints today. Wounds XIANA, SAWIN A ([id number]) [id number] Page 9 of 22 continue to improve albeit slowly. 8/14; 8/14; patient has 3 wounds 1 on the left plantar heel 1 on the right plantar heel and a small wound on the right anterior lower leg. We have been using Hydrofera Blue in the left heel Aquacel on the right heel. The left heel has been in a total contact cast 8/21; patient presents for follow-up. We have been using Hydrofera Blue to the left foot wound under the total contact cast. T the right heel patient has been o using Aquacel Ag. T the anterior leg wound antibiotic ointment. All wounds have healed. o 8/28; patient presents for follow-up. She has been using her surgical shoe with offloading felt pads. Her wounds have remained closed. 9/11; patient presents for final follow-up to assure that her wounds have remained closed. Unfortunately the left foot wound has opened again. But she has been wearing her Surgical shoe for offloading with felt pad. Electronic Signature(s) Signed: 07/05/2023 1:14:22 PM By: Geralyn Corwin DO Entered By:  Geralyn Corwin on 07/05/2023 12:12:13 -------------------------------------------------------------------------------- Physical Exam Details Patient Name: Date of Service: [name] 07/05/2023 11:00 A M Medical Record Number: [id number] Patient Account Number: [id number] Date of Birth/Sex: Treating RN: Apr 15, 1976 (47 y.o. [name] Primary Care Provider: [name] Other Clinician: [name] Referring Provider: Treating Provider/Extender: Chapman Moss in Treatment: 88 Constitutional . Cardiovascular . Psychiatric . Notes Previous right plantar foot wound remains healed. T the left plantar foot there is an open wound with callus, nonviable tissue and granulation tissue. No signs o of surrounding infection. Postdebridement Healthy Granulation tissue present. Electronic Signature(s) Signed: 07/05/2023 1:14:22 PM By: Geralyn Corwin DO Entered By: Geralyn Corwin on 07/05/2023 12:13:05 -------------------------------------------------------------------------------- Physician Orders Details Patient Name: Date of Service: [name] 07/05/2023 11:00 A M Medical Record Number: [id number] Patient Account Number: [id number] Date of Birth/Sex: Treating RN: [birth year] (47 y.o. [name] Primary Care Provider: [name] Other Clinician: Jaqulyn, Skurka A ([id number]) [id number] Page 10 of 22 Referring Provider: Treating Provider/Extender: Chapman Moss in Treatment: 41 Verbal / Phone Orders: Yes Clinician: Yevonne Pax Read Back and Verified: Yes Diagnosis Coding Follow-up Appointments Return Appointment in 2 weeks. Bathing/ Shower/ Hygiene May shower; gently cleanse wound with antibacterial soap, rinse and pat dry prior to dressing wounds No tub bath. Off-Loading Total Contact Cast to Left Lower Extremity Open toe surgical shoe - right,  left Other: - felt offloading pad to left foot Wound Treatment Wound #12R - Foot Wound Laterality: Plantar, Left, Medial Cleanser: Vashe 5.8 (oz) Discharge Instructions: Use vashe 5.8 (oz) as directed Topical: Gentamicin Discharge Instructions: Apply as directed by provider. Topical: Mupirocin Ointment Discharge Instructions: Apply as directed by provider. Prim Dressing: Hydrofera Blue Ready Transfer Foam, 2.5x2.5 (in/in) ary Discharge Instructions: Apply Hydrofera Blue Ready to wound bed as directed Secondary Dressing: ABD Pad 5x9 (in/in) Discharge Instructions: Cover with ABD pad Electronic Signature(s) Signed: 07/05/2023 1:14:22 PM By: Geralyn Corwin DO Entered By: Geralyn Corwin on 07/05/2023 12:38:41 -------------------------------------------------------------------------------- Problem List Details Patient Name: Date of Service: [name] 07/05/2023 11:00 A M Medical Record Number: [id number] Patient Account Number: [id number] Date of Birth/Sex: Treating RN: 07-28-1976 (46 y.o. [name] Primary Care Provider: [name] Other Clinician: [name] Referring Provider: Treating Provider/Extender: Chapman Moss in Treatment: 66 Active Problems ICD-10 Encounter Code Description  MICHIAH, BEDA A ([id number]) [id number] Page 1 of 22 Visit Report for 07/05/2023 Chief Complaint Document Details Patient Name: Date of Service: ANDRA, BELOUS 07/05/2023 11:00 A M Medical Record Number: [id number] Patient Account Number: [id number] Date of Birth/Sex: Treating RN: 1976-06-23 (47 y.o. [name] Primary Care Provider: [name] Other Clinician: [name] Referring Provider: Treating Provider/Extender: Chapman Moss in Treatment: 22 Information Obtained from: Patient Chief Complaint 03/19/2021; patient referred by Dr. Excell Seltzer who has been looking after her left foot for quite a period of time for review of a nonhealing area in the left midfoot 03/12/2022; bilateral feet wounds and right lower extremity wound. Electronic Signature(s) Signed: 07/05/2023 1:14:22 PM By: Geralyn Corwin DO Entered By: Geralyn Corwin on 07/05/2023 12:10:17 -------------------------------------------------------------------------------- Debridement Details Patient Name: Date of Service: [name], Silva Bandy A. 07/05/2023 11:00 A M Medical Record Number: [id number] Patient Account Number: [id number] Date of Birth/Sex: Treating RN: [birth year] (48 y.o. [name] Primary Care Provider: [name] Other Clinician: [name] Referring Provider: Treating Provider/Extender: Chapman Moss in Treatment: 68 Debridement Performed for Assessment: Wound #12R Left,Medial,Plantar Foot Performed By: Physician Geralyn Corwin, MD Debridement Type: Debridement Severity of Tissue Pre Debridement: Fat layer exposed Level of Consciousness (Pre-procedure): Awake and Alert Pre-procedure Verification/Time Out Yes - 11:41 Taken: Start Time: 11:41 Percent of Wound Bed Debrided: 100% T Area Debrided (cm): otal 2.36 Tissue and other material debrided: Viable, Non-Viable, Callus, Slough, Subcutaneous,  Slough Level: Skin/Subcutaneous Tissue Debridement Description: Excisional Instrument: Curette Bleeding: Minimum Hemostasis Achieved: Pressure Response to Treatment: Procedure was tolerated well Level of Consciousness (Post- Awake and Alert NEVAEH, POINDEXTER A ([id number]) [id number] Page 2 of 22 Awake and Alert procedure): Post Debridement Measurements of Total Wound Length: (cm) 2 Width: (cm) 1.5 Depth: (cm) 0.3 Volume: (cm) 0.707 Character of Wound/Ulcer Post Debridement: Stable Severity of Tissue Post Debridement: Fat layer exposed Post Procedure Diagnosis Same as Pre-procedure Electronic Signature(s) Signed: 07/05/2023 1:14:22 PM By: Geralyn Corwin DO Signed: 07/05/2023 4:52:51 PM By: [name] Signed: 07/07/2023 1:15:19 PM By: Yevonne Pax RN Entered By: [name] on 07/05/2023 11:51:01 -------------------------------------------------------------------------------- HPI Details Patient Name: Date of Service: [name], Karuna A. 07/05/2023 11:00 A M Medical Record Number: [id number] Patient Account Number: [id number] Date of Birth/Sex: Treating RN: 01-17-1976 (47 y.o. [name] Primary Care Provider: [name] Other Clinician: [name] Referring Provider: Treating Provider/Extender: Chapman Moss in Treatment: 1 History of Present Illness HPI Description: 01/18/18-She is here for initial evaluation of the left great toe ulcer. She is a poor historian in regards to timeframe in detail. She states approximately 4 weeks ago she lacerated her toe on something in the house. She followed up with her primary care who placed her on Bactrim and ultimately a second dose of Bactrim prior to coming to wound clinic. She states she has been treating the toe with peroxide, Betadine and a Band-Aid. She did not check her blood sugar this morning but checked it yesterday morning it was 327; she is unaware of a recent  A1c and there are no current records. She saw Dr. she would've orthopedics last week for an old injury to the left ankle, she states he did not see her toe, nor did she bring it to his attention. She smokes approximately 1 pack cigarettes a day. Her social situation is concerning, she arrives this morning with her mother who appears extremely intoxicated/under the influence; her mother was asked to leave the room  MICHIAH, BEDA A ([id number]) [id number] Page 1 of 22 Visit Report for 07/05/2023 Chief Complaint Document Details Patient Name: Date of Service: ANDRA, BELOUS 07/05/2023 11:00 A M Medical Record Number: [id number] Patient Account Number: [id number] Date of Birth/Sex: Treating RN: 1976-06-23 (47 y.o. [name] Primary Care Provider: [name] Other Clinician: [name] Referring Provider: Treating Provider/Extender: Chapman Moss in Treatment: 22 Information Obtained from: Patient Chief Complaint 03/19/2021; patient referred by Dr. Excell Seltzer who has been looking after her left foot for quite a period of time for review of a nonhealing area in the left midfoot 03/12/2022; bilateral feet wounds and right lower extremity wound. Electronic Signature(s) Signed: 07/05/2023 1:14:22 PM By: Geralyn Corwin DO Entered By: Geralyn Corwin on 07/05/2023 12:10:17 -------------------------------------------------------------------------------- Debridement Details Patient Name: Date of Service: [name], Silva Bandy A. 07/05/2023 11:00 A M Medical Record Number: [id number] Patient Account Number: [id number] Date of Birth/Sex: Treating RN: [birth year] (48 y.o. [name] Primary Care Provider: [name] Other Clinician: [name] Referring Provider: Treating Provider/Extender: Chapman Moss in Treatment: 68 Debridement Performed for Assessment: Wound #12R Left,Medial,Plantar Foot Performed By: Physician Geralyn Corwin, MD Debridement Type: Debridement Severity of Tissue Pre Debridement: Fat layer exposed Level of Consciousness (Pre-procedure): Awake and Alert Pre-procedure Verification/Time Out Yes - 11:41 Taken: Start Time: 11:41 Percent of Wound Bed Debrided: 100% T Area Debrided (cm): otal 2.36 Tissue and other material debrided: Viable, Non-Viable, Callus, Slough, Subcutaneous,  Slough Level: Skin/Subcutaneous Tissue Debridement Description: Excisional Instrument: Curette Bleeding: Minimum Hemostasis Achieved: Pressure Response to Treatment: Procedure was tolerated well Level of Consciousness (Post- Awake and Alert NEVAEH, POINDEXTER A ([id number]) [id number] Page 2 of 22 Awake and Alert procedure): Post Debridement Measurements of Total Wound Length: (cm) 2 Width: (cm) 1.5 Depth: (cm) 0.3 Volume: (cm) 0.707 Character of Wound/Ulcer Post Debridement: Stable Severity of Tissue Post Debridement: Fat layer exposed Post Procedure Diagnosis Same as Pre-procedure Electronic Signature(s) Signed: 07/05/2023 1:14:22 PM By: Geralyn Corwin DO Signed: 07/05/2023 4:52:51 PM By: [name] Signed: 07/07/2023 1:15:19 PM By: Yevonne Pax RN Entered By: [name] on 07/05/2023 11:51:01 -------------------------------------------------------------------------------- HPI Details Patient Name: Date of Service: [name], Karuna A. 07/05/2023 11:00 A M Medical Record Number: [id number] Patient Account Number: [id number] Date of Birth/Sex: Treating RN: 01-17-1976 (47 y.o. [name] Primary Care Provider: [name] Other Clinician: [name] Referring Provider: Treating Provider/Extender: Chapman Moss in Treatment: 1 History of Present Illness HPI Description: 01/18/18-She is here for initial evaluation of the left great toe ulcer. She is a poor historian in regards to timeframe in detail. She states approximately 4 weeks ago she lacerated her toe on something in the house. She followed up with her primary care who placed her on Bactrim and ultimately a second dose of Bactrim prior to coming to wound clinic. She states she has been treating the toe with peroxide, Betadine and a Band-Aid. She did not check her blood sugar this morning but checked it yesterday morning it was 327; she is unaware of a recent  A1c and there are no current records. She saw Dr. she would've orthopedics last week for an old injury to the left ankle, she states he did not see her toe, nor did she bring it to his attention. She smokes approximately 1 pack cigarettes a day. Her social situation is concerning, she arrives this morning with her mother who appears extremely intoxicated/under the influence; her mother was asked to leave the room  oral antibiotics. She has no issues or complaints today. Wounds XIANA, SAWIN A ([id number]) [id number] Page 9 of 22 continue to improve albeit slowly. 8/14; 8/14; patient has 3 wounds 1 on the left plantar heel 1 on the right plantar heel and a small wound on the right anterior lower leg. We have been using Hydrofera Blue in the left heel Aquacel on the right heel. The left heel has been in a total contact cast 8/21; patient presents for follow-up. We have been using Hydrofera Blue to the left foot wound under the total contact cast. T the right heel patient has been o using Aquacel Ag. T the anterior leg wound antibiotic ointment. All wounds have healed. o 8/28; patient presents for follow-up. She has been using her surgical shoe with offloading felt pads. Her wounds have remained closed. 9/11; patient presents for final follow-up to assure that her wounds have remained closed. Unfortunately the left foot wound has opened again. But she has been wearing her Surgical shoe for offloading with felt pad. Electronic Signature(s) Signed: 07/05/2023 1:14:22 PM By: Geralyn Corwin DO Entered By:  Geralyn Corwin on 07/05/2023 12:12:13 -------------------------------------------------------------------------------- Physical Exam Details Patient Name: Date of Service: [name] 07/05/2023 11:00 A M Medical Record Number: [id number] Patient Account Number: [id number] Date of Birth/Sex: Treating RN: Apr 15, 1976 (47 y.o. [name] Primary Care Provider: [name] Other Clinician: [name] Referring Provider: Treating Provider/Extender: Chapman Moss in Treatment: 88 Constitutional . Cardiovascular . Psychiatric . Notes Previous right plantar foot wound remains healed. T the left plantar foot there is an open wound with callus, nonviable tissue and granulation tissue. No signs o of surrounding infection. Postdebridement Healthy Granulation tissue present. Electronic Signature(s) Signed: 07/05/2023 1:14:22 PM By: Geralyn Corwin DO Entered By: Geralyn Corwin on 07/05/2023 12:13:05 -------------------------------------------------------------------------------- Physician Orders Details Patient Name: Date of Service: [name] 07/05/2023 11:00 A M Medical Record Number: [id number] Patient Account Number: [id number] Date of Birth/Sex: Treating RN: [birth year] (47 y.o. [name] Primary Care Provider: [name] Other Clinician: Jaqulyn, Skurka A ([id number]) [id number] Page 10 of 22 Referring Provider: Treating Provider/Extender: Chapman Moss in Treatment: 41 Verbal / Phone Orders: Yes Clinician: Yevonne Pax Read Back and Verified: Yes Diagnosis Coding Follow-up Appointments Return Appointment in 2 weeks. Bathing/ Shower/ Hygiene May shower; gently cleanse wound with antibacterial soap, rinse and pat dry prior to dressing wounds No tub bath. Off-Loading Total Contact Cast to Left Lower Extremity Open toe surgical shoe - right,  left Other: - felt offloading pad to left foot Wound Treatment Wound #12R - Foot Wound Laterality: Plantar, Left, Medial Cleanser: Vashe 5.8 (oz) Discharge Instructions: Use vashe 5.8 (oz) as directed Topical: Gentamicin Discharge Instructions: Apply as directed by provider. Topical: Mupirocin Ointment Discharge Instructions: Apply as directed by provider. Prim Dressing: Hydrofera Blue Ready Transfer Foam, 2.5x2.5 (in/in) ary Discharge Instructions: Apply Hydrofera Blue Ready to wound bed as directed Secondary Dressing: ABD Pad 5x9 (in/in) Discharge Instructions: Cover with ABD pad Electronic Signature(s) Signed: 07/05/2023 1:14:22 PM By: Geralyn Corwin DO Entered By: Geralyn Corwin on 07/05/2023 12:38:41 -------------------------------------------------------------------------------- Problem List Details Patient Name: Date of Service: [name] 07/05/2023 11:00 A M Medical Record Number: [id number] Patient Account Number: [id number] Date of Birth/Sex: Treating RN: 07-28-1976 (46 y.o. [name] Primary Care Provider: [name] Other Clinician: [name] Referring Provider: Treating Provider/Extender: Chapman Moss in Treatment: 66 Active Problems ICD-10 Encounter Code Description  MICHIAH, BEDA A ([id number]) [id number] Page 1 of 22 Visit Report for 07/05/2023 Chief Complaint Document Details Patient Name: Date of Service: ANDRA, BELOUS 07/05/2023 11:00 A M Medical Record Number: [id number] Patient Account Number: [id number] Date of Birth/Sex: Treating RN: 1976-06-23 (47 y.o. [name] Primary Care Provider: [name] Other Clinician: [name] Referring Provider: Treating Provider/Extender: Chapman Moss in Treatment: 22 Information Obtained from: Patient Chief Complaint 03/19/2021; patient referred by Dr. Excell Seltzer who has been looking after her left foot for quite a period of time for review of a nonhealing area in the left midfoot 03/12/2022; bilateral feet wounds and right lower extremity wound. Electronic Signature(s) Signed: 07/05/2023 1:14:22 PM By: Geralyn Corwin DO Entered By: Geralyn Corwin on 07/05/2023 12:10:17 -------------------------------------------------------------------------------- Debridement Details Patient Name: Date of Service: [name], Silva Bandy A. 07/05/2023 11:00 A M Medical Record Number: [id number] Patient Account Number: [id number] Date of Birth/Sex: Treating RN: [birth year] (48 y.o. [name] Primary Care Provider: [name] Other Clinician: [name] Referring Provider: Treating Provider/Extender: Chapman Moss in Treatment: 68 Debridement Performed for Assessment: Wound #12R Left,Medial,Plantar Foot Performed By: Physician Geralyn Corwin, MD Debridement Type: Debridement Severity of Tissue Pre Debridement: Fat layer exposed Level of Consciousness (Pre-procedure): Awake and Alert Pre-procedure Verification/Time Out Yes - 11:41 Taken: Start Time: 11:41 Percent of Wound Bed Debrided: 100% T Area Debrided (cm): otal 2.36 Tissue and other material debrided: Viable, Non-Viable, Callus, Slough, Subcutaneous,  Slough Level: Skin/Subcutaneous Tissue Debridement Description: Excisional Instrument: Curette Bleeding: Minimum Hemostasis Achieved: Pressure Response to Treatment: Procedure was tolerated well Level of Consciousness (Post- Awake and Alert NEVAEH, POINDEXTER A ([id number]) [id number] Page 2 of 22 Awake and Alert procedure): Post Debridement Measurements of Total Wound Length: (cm) 2 Width: (cm) 1.5 Depth: (cm) 0.3 Volume: (cm) 0.707 Character of Wound/Ulcer Post Debridement: Stable Severity of Tissue Post Debridement: Fat layer exposed Post Procedure Diagnosis Same as Pre-procedure Electronic Signature(s) Signed: 07/05/2023 1:14:22 PM By: Geralyn Corwin DO Signed: 07/05/2023 4:52:51 PM By: [name] Signed: 07/07/2023 1:15:19 PM By: Yevonne Pax RN Entered By: [name] on 07/05/2023 11:51:01 -------------------------------------------------------------------------------- HPI Details Patient Name: Date of Service: [name], Karuna A. 07/05/2023 11:00 A M Medical Record Number: [id number] Patient Account Number: [id number] Date of Birth/Sex: Treating RN: 01-17-1976 (47 y.o. [name] Primary Care Provider: [name] Other Clinician: [name] Referring Provider: Treating Provider/Extender: Chapman Moss in Treatment: 1 History of Present Illness HPI Description: 01/18/18-She is here for initial evaluation of the left great toe ulcer. She is a poor historian in regards to timeframe in detail. She states approximately 4 weeks ago she lacerated her toe on something in the house. She followed up with her primary care who placed her on Bactrim and ultimately a second dose of Bactrim prior to coming to wound clinic. She states she has been treating the toe with peroxide, Betadine and a Band-Aid. She did not check her blood sugar this morning but checked it yesterday morning it was 327; she is unaware of a recent  A1c and there are no current records. She saw Dr. she would've orthopedics last week for an old injury to the left ankle, she states he did not see her toe, nor did she bring it to his attention. She smokes approximately 1 pack cigarettes a day. 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 03/16/2022 No Yes of skin W54.0XXA Bitten by dog, initial encounter 05/17/2023 No Yes S81.801A Unspecified open wound, right lower leg, initial encounter 05/17/2023 No Yes E11.622  Type 2 diabetes mellitus with other skin ulcer 05/17/2023 No Yes E11.42 Type 2 diabetes mellitus with diabetic polyneuropathy 03/16/2022 No Yes Z79.4 Long term (current) use of insulin 02/22/2023 No Yes Inactive Problems Resolved Problems Electronic Signature(s) Signed: 07/05/2023 1:14:22 PM By: Geralyn Corwin DO Entered By: Geralyn Corwin on 07/05/2023 12:10:09 -------------------------------------------------------------------------------- Progress Note Details Patient Name: Date of Service: [name] 07/05/2023 11:00 A M Medical Record Number: [id number] Patient Account Number: [id number] Date of Birth/Sex: Treating RN: May 27, 1976 (47 y.o. [name] Primary Care Provider: [name] Other Clinician: [name] Referring Provider: Treating Provider/Extender: Chapman Moss in Treatment: 29 Subjective Chief Complaint Information obtained from Patient 03/19/2021; patient referred by Dr. Excell Seltzer who has been looking after her left foot for quite a period of time for review of a nonhealing area in the left midfoot 03/12/2022; bilateral feet wounds and right lower extremity wound. History of Present Illness (HPI) 01/18/18-She is here for initial evaluation of the left great toe ulcer. She is a poor historian in regards to timeframe in detail. She states approximately 4 weeks ago she lacerated her toe on something in the house. She followed up with her primary care who placed her on Bactrim and ultimately a second dose of Bactrim prior to coming to wound clinic. She states she has been treating the toe with peroxide, Betadine and a Band-Aid. She did not check her blood sugar this morning but checked it yesterday morning it was 327; she is unaware of a recent A1c and there are no current records. She saw Dr. she would've orthopedics last week for an old injury to the left ankle, she states he did not see her toe, nor did she bring it to his attention.  She smokes approximately 1 pack cigarettes ZYLA, ZARZA A ([id number]) [id number] Page 12 of 22 a day. Her social situation is concerning, she arrives this morning with her mother who appears extremely intoxicated/under the influence; her mother was asked to leave the room and be monitored by the patient's grandmother. The patient's aunt then accompanied the patient and the room throughout the rest of the appointment. We had a lengthy discussion regarding the deleterious effects of uncontrolled hyperglycemia and smoking as it relates to wound healing and overall health. She was strongly encouraged to decrease her smoking and get her diabetes under better control. She states she is currently on a diet and has cut down her [facility] consumption. The left toe is erythematous, macerated and slightly edematous with malodor present. The edema in her left foot is below her baseline, there is no erythema streaking. We will treat her with Santyl, doxycycline; we have ordered and xray, culture and provided a Peg assist surgical shoe and cultured the wound. 01/25/18-She is here in follow-up evaluation for a left great toe ulcer and presents with an abscess to her suprapubic area. She states her blood sugars remain elevated,  MICHIAH, BEDA A ([id number]) [id number] Page 1 of 22 Visit Report for 07/05/2023 Chief Complaint Document Details Patient Name: Date of Service: ANDRA, BELOUS 07/05/2023 11:00 A M Medical Record Number: [id number] Patient Account Number: [id number] Date of Birth/Sex: Treating RN: 1976-06-23 (47 y.o. [name] Primary Care Provider: [name] Other Clinician: [name] Referring Provider: Treating Provider/Extender: Chapman Moss in Treatment: 22 Information Obtained from: Patient Chief Complaint 03/19/2021; patient referred by Dr. Excell Seltzer who has been looking after her left foot for quite a period of time for review of a nonhealing area in the left midfoot 03/12/2022; bilateral feet wounds and right lower extremity wound. Electronic Signature(s) Signed: 07/05/2023 1:14:22 PM By: Geralyn Corwin DO Entered By: Geralyn Corwin on 07/05/2023 12:10:17 -------------------------------------------------------------------------------- Debridement Details Patient Name: Date of Service: [name], Silva Bandy A. 07/05/2023 11:00 A M Medical Record Number: [id number] Patient Account Number: [id number] Date of Birth/Sex: Treating RN: [birth year] (48 y.o. [name] Primary Care Provider: [name] Other Clinician: [name] Referring Provider: Treating Provider/Extender: Chapman Moss in Treatment: 68 Debridement Performed for Assessment: Wound #12R Left,Medial,Plantar Foot Performed By: Physician Geralyn Corwin, MD Debridement Type: Debridement Severity of Tissue Pre Debridement: Fat layer exposed Level of Consciousness (Pre-procedure): Awake and Alert Pre-procedure Verification/Time Out Yes - 11:41 Taken: Start Time: 11:41 Percent of Wound Bed Debrided: 100% T Area Debrided (cm): otal 2.36 Tissue and other material debrided: Viable, Non-Viable, Callus, Slough, Subcutaneous,  Slough Level: Skin/Subcutaneous Tissue Debridement Description: Excisional Instrument: Curette Bleeding: Minimum Hemostasis Achieved: Pressure Response to Treatment: Procedure was tolerated well Level of Consciousness (Post- Awake and Alert NEVAEH, POINDEXTER A ([id number]) [id number] Page 2 of 22 Awake and Alert procedure): Post Debridement Measurements of Total Wound Length: (cm) 2 Width: (cm) 1.5 Depth: (cm) 0.3 Volume: (cm) 0.707 Character of Wound/Ulcer Post Debridement: Stable Severity of Tissue Post Debridement: Fat layer exposed Post Procedure Diagnosis Same as Pre-procedure Electronic Signature(s) Signed: 07/05/2023 1:14:22 PM By: Geralyn Corwin DO Signed: 07/05/2023 4:52:51 PM By: [name] Signed: 07/07/2023 1:15:19 PM By: Yevonne Pax RN Entered By: [name] on 07/05/2023 11:51:01 -------------------------------------------------------------------------------- HPI Details Patient Name: Date of Service: [name], Karuna A. 07/05/2023 11:00 A M Medical Record Number: [id number] Patient Account Number: [id number] Date of Birth/Sex: Treating RN: 01-17-1976 (47 y.o. [name] Primary Care Provider: [name] Other Clinician: [name] Referring Provider: Treating Provider/Extender: Chapman Moss in Treatment: 1 History of Present Illness HPI Description: 01/18/18-She is here for initial evaluation of the left great toe ulcer. She is a poor historian in regards to timeframe in detail. She states approximately 4 weeks ago she lacerated her toe on something in the house. She followed up with her primary care who placed her on Bactrim and ultimately a second dose of Bactrim prior to coming to wound clinic. She states she has been treating the toe with peroxide, Betadine and a Band-Aid. She did not check her blood sugar this morning but checked it yesterday morning it was 327; she is unaware of a recent  A1c and there are no current records. She saw Dr. she would've orthopedics last week for an old injury to the left ankle, she states he did not see her toe, nor did she bring it to his attention. She smokes approximately 1 pack cigarettes a day. 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 03/16/2022 No Yes of skin W54.0XXA Bitten by dog, initial encounter 05/17/2023 No Yes S81.801A Unspecified open wound, right lower leg, initial encounter 05/17/2023 No Yes E11.622  Type 2 diabetes mellitus with other skin ulcer 05/17/2023 No Yes E11.42 Type 2 diabetes mellitus with diabetic polyneuropathy 03/16/2022 No Yes Z79.4 Long term (current) use of insulin 02/22/2023 No Yes Inactive Problems Resolved Problems Electronic Signature(s) Signed: 07/05/2023 1:14:22 PM By: Geralyn Corwin DO Entered By: Geralyn Corwin on 07/05/2023 12:10:09 -------------------------------------------------------------------------------- Progress Note Details Patient Name: Date of Service: [name] 07/05/2023 11:00 A M Medical Record Number: [id number] Patient Account Number: [id number] Date of Birth/Sex: Treating RN: May 27, 1976 (47 y.o. [name] Primary Care Provider: [name] Other Clinician: [name] Referring Provider: Treating Provider/Extender: Chapman Moss in Treatment: 29 Subjective Chief Complaint Information obtained from Patient 03/19/2021; patient referred by Dr. Excell Seltzer who has been looking after her left foot for quite a period of time for review of a nonhealing area in the left midfoot 03/12/2022; bilateral feet wounds and right lower extremity wound. History of Present Illness (HPI) 01/18/18-She is here for initial evaluation of the left great toe ulcer. She is a poor historian in regards to timeframe in detail. She states approximately 4 weeks ago she lacerated her toe on something in the house. She followed up with her primary care who placed her on Bactrim and ultimately a second dose of Bactrim prior to coming to wound clinic. She states she has been treating the toe with peroxide, Betadine and a Band-Aid. She did not check her blood sugar this morning but checked it yesterday morning it was 327; she is unaware of a recent A1c and there are no current records. She saw Dr. she would've orthopedics last week for an old injury to the left ankle, she states he did not see her toe, nor did she bring it to his attention.  She smokes approximately 1 pack cigarettes ZYLA, ZARZA A ([id number]) [id number] Page 12 of 22 a day. Her social situation is concerning, she arrives this morning with her mother who appears extremely intoxicated/under the influence; her mother was asked to leave the room and be monitored by the patient's grandmother. The patient's aunt then accompanied the patient and the room throughout the rest of the appointment. We had a lengthy discussion regarding the deleterious effects of uncontrolled hyperglycemia and smoking as it relates to wound healing and overall health. She was strongly encouraged to decrease her smoking and get her diabetes under better control. She states she is currently on a diet and has cut down her [facility] consumption. The left toe is erythematous, macerated and slightly edematous with malodor present. The edema in her left foot is below her baseline, there is no erythema streaking. We will treat her with Santyl, doxycycline; we have ordered and xray, culture and provided a Peg assist surgical shoe and cultured the wound. 01/25/18-She is here in follow-up evaluation for a left great toe ulcer and presents with an abscess to her suprapubic area. She states her blood sugars remain elevated,  MICHIAH, BEDA A ([id number]) [id number] Page 1 of 22 Visit Report for 07/05/2023 Chief Complaint Document Details Patient Name: Date of Service: ANDRA, BELOUS 07/05/2023 11:00 A M Medical Record Number: [id number] Patient Account Number: [id number] Date of Birth/Sex: Treating RN: 1976-06-23 (47 y.o. [name] Primary Care Provider: [name] Other Clinician: [name] Referring Provider: Treating Provider/Extender: Chapman Moss in Treatment: 22 Information Obtained from: Patient Chief Complaint 03/19/2021; patient referred by Dr. Excell Seltzer who has been looking after her left foot for quite a period of time for review of a nonhealing area in the left midfoot 03/12/2022; bilateral feet wounds and right lower extremity wound. Electronic Signature(s) Signed: 07/05/2023 1:14:22 PM By: Geralyn Corwin DO Entered By: Geralyn Corwin on 07/05/2023 12:10:17 -------------------------------------------------------------------------------- Debridement Details Patient Name: Date of Service: [name], Silva Bandy A. 07/05/2023 11:00 A M Medical Record Number: [id number] Patient Account Number: [id number] Date of Birth/Sex: Treating RN: [birth year] (48 y.o. [name] Primary Care Provider: [name] Other Clinician: [name] Referring Provider: Treating Provider/Extender: Chapman Moss in Treatment: 68 Debridement Performed for Assessment: Wound #12R Left,Medial,Plantar Foot Performed By: Physician Geralyn Corwin, MD Debridement Type: Debridement Severity of Tissue Pre Debridement: Fat layer exposed Level of Consciousness (Pre-procedure): Awake and Alert Pre-procedure Verification/Time Out Yes - 11:41 Taken: Start Time: 11:41 Percent of Wound Bed Debrided: 100% T Area Debrided (cm): otal 2.36 Tissue and other material debrided: Viable, Non-Viable, Callus, Slough, Subcutaneous,  Slough Level: Skin/Subcutaneous Tissue Debridement Description: Excisional Instrument: Curette Bleeding: Minimum Hemostasis Achieved: Pressure Response to Treatment: Procedure was tolerated well Level of Consciousness (Post- Awake and Alert NEVAEH, POINDEXTER A ([id number]) [id number] Page 2 of 22 Awake and Alert procedure): Post Debridement Measurements of Total Wound Length: (cm) 2 Width: (cm) 1.5 Depth: (cm) 0.3 Volume: (cm) 0.707 Character of Wound/Ulcer Post Debridement: Stable Severity of Tissue Post Debridement: Fat layer exposed Post Procedure Diagnosis Same as Pre-procedure Electronic Signature(s) Signed: 07/05/2023 1:14:22 PM By: Geralyn Corwin DO Signed: 07/05/2023 4:52:51 PM By: [name] Signed: 07/07/2023 1:15:19 PM By: Yevonne Pax RN Entered By: [name] on 07/05/2023 11:51:01 -------------------------------------------------------------------------------- HPI Details Patient Name: Date of Service: [name], Karuna A. 07/05/2023 11:00 A M Medical Record Number: [id number] Patient Account Number: [id number] Date of Birth/Sex: Treating RN: 01-17-1976 (47 y.o. [name] Primary Care Provider: [name] Other Clinician: [name] Referring Provider: Treating Provider/Extender: Chapman Moss in Treatment: 1 History of Present Illness HPI Description: 01/18/18-She is here for initial evaluation of the left great toe ulcer. She is a poor historian in regards to timeframe in detail. She states approximately 4 weeks ago she lacerated her toe on something in the house. She followed up with her primary care who placed her on Bactrim and ultimately a second dose of Bactrim prior to coming to wound clinic. She states she has been treating the toe with peroxide, Betadine and a Band-Aid. She did not check her blood sugar this morning but checked it yesterday morning it was 327; she is unaware of a recent  A1c and there are no current records. She saw Dr. she would've orthopedics last week for an old injury to the left ankle, she states he did not see her toe, nor did she bring it to his attention. She smokes approximately 1 pack cigarettes a day. Her social situation is concerning, she arrives this morning with her mother who appears extremely intoxicated/under the influence; her mother was asked to leave the room  MICHIAH, BEDA A ([id number]) [id number] Page 1 of 22 Visit Report for 07/05/2023 Chief Complaint Document Details Patient Name: Date of Service: ANDRA, BELOUS 07/05/2023 11:00 A M Medical Record Number: [id number] Patient Account Number: [id number] Date of Birth/Sex: Treating RN: 1976-06-23 (47 y.o. [name] Primary Care Provider: [name] Other Clinician: [name] Referring Provider: Treating Provider/Extender: Chapman Moss in Treatment: 22 Information Obtained from: Patient Chief Complaint 03/19/2021; patient referred by Dr. Excell Seltzer who has been looking after her left foot for quite a period of time for review of a nonhealing area in the left midfoot 03/12/2022; bilateral feet wounds and right lower extremity wound. Electronic Signature(s) Signed: 07/05/2023 1:14:22 PM By: Geralyn Corwin DO Entered By: Geralyn Corwin on 07/05/2023 12:10:17 -------------------------------------------------------------------------------- Debridement Details Patient Name: Date of Service: [name], Silva Bandy A. 07/05/2023 11:00 A M Medical Record Number: [id number] Patient Account Number: [id number] Date of Birth/Sex: Treating RN: [birth year] (48 y.o. [name] Primary Care Provider: [name] Other Clinician: [name] Referring Provider: Treating Provider/Extender: Chapman Moss in Treatment: 68 Debridement Performed for Assessment: Wound #12R Left,Medial,Plantar Foot Performed By: Physician Geralyn Corwin, MD Debridement Type: Debridement Severity of Tissue Pre Debridement: Fat layer exposed Level of Consciousness (Pre-procedure): Awake and Alert Pre-procedure Verification/Time Out Yes - 11:41 Taken: Start Time: 11:41 Percent of Wound Bed Debrided: 100% T Area Debrided (cm): otal 2.36 Tissue and other material debrided: Viable, Non-Viable, Callus, Slough, Subcutaneous,  Slough Level: Skin/Subcutaneous Tissue Debridement Description: Excisional Instrument: Curette Bleeding: Minimum Hemostasis Achieved: Pressure Response to Treatment: Procedure was tolerated well Level of Consciousness (Post- Awake and Alert NEVAEH, POINDEXTER A ([id number]) [id number] Page 2 of 22 Awake and Alert procedure): Post Debridement Measurements of Total Wound Length: (cm) 2 Width: (cm) 1.5 Depth: (cm) 0.3 Volume: (cm) 0.707 Character of Wound/Ulcer Post Debridement: Stable Severity of Tissue Post Debridement: Fat layer exposed Post Procedure Diagnosis Same as Pre-procedure Electronic Signature(s) Signed: 07/05/2023 1:14:22 PM By: Geralyn Corwin DO Signed: 07/05/2023 4:52:51 PM By: [name] Signed: 07/07/2023 1:15:19 PM By: Yevonne Pax RN Entered By: [name] on 07/05/2023 11:51:01 -------------------------------------------------------------------------------- HPI Details Patient Name: Date of Service: [name], Karuna A. 07/05/2023 11:00 A M Medical Record Number: [id number] Patient Account Number: [id number] Date of Birth/Sex: Treating RN: 01-17-1976 (47 y.o. [name] Primary Care Provider: [name] Other Clinician: [name] Referring Provider: Treating Provider/Extender: Chapman Moss in Treatment: 1 History of Present Illness HPI Description: 01/18/18-She is here for initial evaluation of the left great toe ulcer. She is a poor historian in regards to timeframe in detail. She states approximately 4 weeks ago she lacerated her toe on something in the house. She followed up with her primary care who placed her on Bactrim and ultimately a second dose of Bactrim prior to coming to wound clinic. She states she has been treating the toe with peroxide, Betadine and a Band-Aid. She did not check her blood sugar this morning but checked it yesterday morning it was 327; she is unaware of a recent  A1c and there are no current records. She saw Dr. she would've orthopedics last week for an old injury to the left ankle, she states he did not see her toe, nor did she bring it to his attention. She smokes approximately 1 pack cigarettes a day. Her social situation is concerning, she arrives this morning with her mother who appears extremely intoxicated/under the influence; her mother was asked to leave the room  oral antibiotics. She has no issues or complaints today. Wounds XIANA, SAWIN A ([id number]) [id number] Page 9 of 22 continue to improve albeit slowly. 8/14; 8/14; patient has 3 wounds 1 on the left plantar heel 1 on the right plantar heel and a small wound on the right anterior lower leg. We have been using Hydrofera Blue in the left heel Aquacel on the right heel. The left heel has been in a total contact cast 8/21; patient presents for follow-up. We have been using Hydrofera Blue to the left foot wound under the total contact cast. T the right heel patient has been o using Aquacel Ag. T the anterior leg wound antibiotic ointment. All wounds have healed. o 8/28; patient presents for follow-up. She has been using her surgical shoe with offloading felt pads. Her wounds have remained closed. 9/11; patient presents for final follow-up to assure that her wounds have remained closed. Unfortunately the left foot wound has opened again. But she has been wearing her Surgical shoe for offloading with felt pad. Electronic Signature(s) Signed: 07/05/2023 1:14:22 PM By: Geralyn Corwin DO Entered By:  Geralyn Corwin on 07/05/2023 12:12:13 -------------------------------------------------------------------------------- Physical Exam Details Patient Name: Date of Service: [name] 07/05/2023 11:00 A M Medical Record Number: [id number] Patient Account Number: [id number] Date of Birth/Sex: Treating RN: Apr 15, 1976 (47 y.o. [name] Primary Care Provider: [name] Other Clinician: [name] Referring Provider: Treating Provider/Extender: Chapman Moss in Treatment: 88 Constitutional . Cardiovascular . Psychiatric . Notes Previous right plantar foot wound remains healed. T the left plantar foot there is an open wound with callus, nonviable tissue and granulation tissue. No signs o of surrounding infection. Postdebridement Healthy Granulation tissue present. Electronic Signature(s) Signed: 07/05/2023 1:14:22 PM By: Geralyn Corwin DO Entered By: Geralyn Corwin on 07/05/2023 12:13:05 -------------------------------------------------------------------------------- Physician Orders Details Patient Name: Date of Service: [name] 07/05/2023 11:00 A M Medical Record Number: [id number] Patient Account Number: [id number] Date of Birth/Sex: Treating RN: [birth year] (47 y.o. [name] Primary Care Provider: [name] Other Clinician: Jaqulyn, Skurka A ([id number]) [id number] Page 10 of 22 Referring Provider: Treating Provider/Extender: Chapman Moss in Treatment: 41 Verbal / Phone Orders: Yes Clinician: Yevonne Pax Read Back and Verified: Yes Diagnosis Coding Follow-up Appointments Return Appointment in 2 weeks. Bathing/ Shower/ Hygiene May shower; gently cleanse wound with antibacterial soap, rinse and pat dry prior to dressing wounds No tub bath. Off-Loading Total Contact Cast to Left Lower Extremity Open toe surgical shoe - right,  left Other: - felt offloading pad to left foot Wound Treatment Wound #12R - Foot Wound Laterality: Plantar, Left, Medial Cleanser: Vashe 5.8 (oz) Discharge Instructions: Use vashe 5.8 (oz) as directed Topical: Gentamicin Discharge Instructions: Apply as directed by provider. Topical: Mupirocin Ointment Discharge Instructions: Apply as directed by provider. Prim Dressing: Hydrofera Blue Ready Transfer Foam, 2.5x2.5 (in/in) ary Discharge Instructions: Apply Hydrofera Blue Ready to wound bed as directed Secondary Dressing: ABD Pad 5x9 (in/in) Discharge Instructions: Cover with ABD pad Electronic Signature(s) Signed: 07/05/2023 1:14:22 PM By: Geralyn Corwin DO Entered By: Geralyn Corwin on 07/05/2023 12:38:41 -------------------------------------------------------------------------------- Problem List Details Patient Name: Date of Service: [name] 07/05/2023 11:00 A M Medical Record Number: [id number] Patient Account Number: [id number] Date of Birth/Sex: Treating RN: 07-28-1976 (46 y.o. 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 03/16/2022 No Yes of skin W54.0XXA Bitten by dog, initial encounter 05/17/2023 No Yes S81.801A Unspecified open wound, right lower leg, initial encounter 05/17/2023 No Yes E11.622  Type 2 diabetes mellitus with other skin ulcer 05/17/2023 No Yes E11.42 Type 2 diabetes mellitus with diabetic polyneuropathy 03/16/2022 No Yes Z79.4 Long term (current) use of insulin 02/22/2023 No Yes Inactive Problems Resolved Problems Electronic Signature(s) Signed: 07/05/2023 1:14:22 PM By: Geralyn Corwin DO Entered By: Geralyn Corwin on 07/05/2023 12:10:09 -------------------------------------------------------------------------------- Progress Note Details Patient Name: Date of Service: [name] 07/05/2023 11:00 A M Medical Record Number: [id number] Patient Account Number: [id number] Date of Birth/Sex: Treating RN: May 27, 1976 (47 y.o. [name] Primary Care Provider: [name] Other Clinician: [name] Referring Provider: Treating Provider/Extender: Chapman Moss in Treatment: 29 Subjective Chief Complaint Information obtained from Patient 03/19/2021; patient referred by Dr. Excell Seltzer who has been looking after her left foot for quite a period of time for review of a nonhealing area in the left midfoot 03/12/2022; bilateral feet wounds and right lower extremity wound. History of Present Illness (HPI) 01/18/18-She is here for initial evaluation of the left great toe ulcer. She is a poor historian in regards to timeframe in detail. She states approximately 4 weeks ago she lacerated her toe on something in the house. She followed up with her primary care who placed her on Bactrim and ultimately a second dose of Bactrim prior to coming to wound clinic. She states she has been treating the toe with peroxide, Betadine and a Band-Aid. She did not check her blood sugar this morning but checked it yesterday morning it was 327; she is unaware of a recent A1c and there are no current records. She saw Dr. she would've orthopedics last week for an old injury to the left ankle, she states he did not see her toe, nor did she bring it to his attention.  She smokes approximately 1 pack cigarettes ZYLA, ZARZA A ([id number]) [id number] Page 12 of 22 a day. Her social situation is concerning, she arrives this morning with her mother who appears extremely intoxicated/under the influence; her mother was asked to leave the room and be monitored by the patient's grandmother. The patient's aunt then accompanied the patient and the room throughout the rest of the appointment. We had a lengthy discussion regarding the deleterious effects of uncontrolled hyperglycemia and smoking as it relates to wound healing and overall health. She was strongly encouraged to decrease her smoking and get her diabetes under better control. She states she is currently on a diet and has cut down her [facility] consumption. The left toe is erythematous, macerated and slightly edematous with malodor present. The edema in her left foot is below her baseline, there is no erythema streaking. We will treat her with Santyl, doxycycline; we have ordered and xray, culture and provided a Peg assist surgical shoe and cultured the wound. 01/25/18-She is here in follow-up evaluation for a left great toe ulcer and presents with an abscess to her suprapubic area. She states her blood sugars remain elevated,  MICHIAH, BEDA A ([id number]) [id number] Page 1 of 22 Visit Report for 07/05/2023 Chief Complaint Document Details Patient Name: Date of Service: ANDRA, BELOUS 07/05/2023 11:00 A M Medical Record Number: [id number] Patient Account Number: [id number] Date of Birth/Sex: Treating RN: 1976-06-23 (47 y.o. [name] Primary Care Provider: [name] Other Clinician: [name] Referring Provider: Treating Provider/Extender: Chapman Moss in Treatment: 22 Information Obtained from: Patient Chief Complaint 03/19/2021; patient referred by Dr. Excell Seltzer who has been looking after her left foot for quite a period of time for review of a nonhealing area in the left midfoot 03/12/2022; bilateral feet wounds and right lower extremity wound. Electronic Signature(s) Signed: 07/05/2023 1:14:22 PM By: Geralyn Corwin DO Entered By: Geralyn Corwin on 07/05/2023 12:10:17 -------------------------------------------------------------------------------- Debridement Details Patient Name: Date of Service: [name], Silva Bandy A. 07/05/2023 11:00 A M Medical Record Number: [id number] Patient Account Number: [id number] Date of Birth/Sex: Treating RN: [birth year] (48 y.o. [name] Primary Care Provider: [name] Other Clinician: [name] Referring Provider: Treating Provider/Extender: Chapman Moss in Treatment: 68 Debridement Performed for Assessment: Wound #12R Left,Medial,Plantar Foot Performed By: Physician Geralyn Corwin, MD Debridement Type: Debridement Severity of Tissue Pre Debridement: Fat layer exposed Level of Consciousness (Pre-procedure): Awake and Alert Pre-procedure Verification/Time Out Yes - 11:41 Taken: Start Time: 11:41 Percent of Wound Bed Debrided: 100% T Area Debrided (cm): otal 2.36 Tissue and other material debrided: Viable, Non-Viable, Callus, Slough, Subcutaneous,  Slough Level: Skin/Subcutaneous Tissue Debridement Description: Excisional Instrument: Curette Bleeding: Minimum Hemostasis Achieved: Pressure Response to Treatment: Procedure was tolerated well Level of Consciousness (Post- Awake and Alert NEVAEH, POINDEXTER A ([id number]) [id number] Page 2 of 22 Awake and Alert procedure): Post Debridement Measurements of Total Wound Length: (cm) 2 Width: (cm) 1.5 Depth: (cm) 0.3 Volume: (cm) 0.707 Character of Wound/Ulcer Post Debridement: Stable Severity of Tissue Post Debridement: Fat layer exposed Post Procedure Diagnosis Same as Pre-procedure Electronic Signature(s) Signed: 07/05/2023 1:14:22 PM By: Geralyn Corwin DO Signed: 07/05/2023 4:52:51 PM By: [name] Signed: 07/07/2023 1:15:19 PM By: Yevonne Pax RN Entered By: [name] on 07/05/2023 11:51:01 -------------------------------------------------------------------------------- HPI Details Patient Name: Date of Service: [name], Karuna A. 07/05/2023 11:00 A M Medical Record Number: [id number] Patient Account Number: [id number] Date of Birth/Sex: Treating RN: 01-17-1976 (47 y.o. [name] Primary Care Provider: [name] Other Clinician: [name] Referring Provider: Treating Provider/Extender: Chapman Moss in Treatment: 1 History of Present Illness HPI Description: 01/18/18-She is here for initial evaluation of the left great toe ulcer. She is a poor historian in regards to timeframe in detail. She states approximately 4 weeks ago she lacerated her toe on something in the house. She followed up with her primary care who placed her on Bactrim and ultimately a second dose of Bactrim prior to coming to wound clinic. She states she has been treating the toe with peroxide, Betadine and a Band-Aid. She did not check her blood sugar this morning but checked it yesterday morning it was 327; she is unaware of a recent  A1c and there are no current records. She saw Dr. she would've orthopedics last week for an old injury to the left ankle, she states he did not see her toe, nor did she bring it to his attention. She smokes approximately 1 pack cigarettes a day. 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 03/16/2022 No Yes of skin W54.0XXA Bitten by dog, initial encounter 05/17/2023 No Yes S81.801A Unspecified open wound, right lower leg, initial encounter 05/17/2023 No Yes E11.622  Type 2 diabetes mellitus with other skin ulcer 05/17/2023 No Yes E11.42 Type 2 diabetes mellitus with diabetic polyneuropathy 03/16/2022 No Yes Z79.4 Long term (current) use of insulin 02/22/2023 No Yes Inactive Problems Resolved Problems Electronic Signature(s) Signed: 07/05/2023 1:14:22 PM By: Geralyn Corwin DO Entered By: Geralyn Corwin on 07/05/2023 12:10:09 -------------------------------------------------------------------------------- Progress Note Details Patient Name: Date of Service: [name] 07/05/2023 11:00 A M Medical Record Number: [id number] Patient Account Number: [id number] Date of Birth/Sex: Treating RN: May 27, 1976 (47 y.o. [name] Primary Care Provider: [name] Other Clinician: [name] Referring Provider: Treating Provider/Extender: Chapman Moss in Treatment: 29 Subjective Chief Complaint Information obtained from Patient 03/19/2021; patient referred by Dr. Excell Seltzer who has been looking after her left foot for quite a period of time for review of a nonhealing area in the left midfoot 03/12/2022; bilateral feet wounds and right lower extremity wound. History of Present Illness (HPI) 01/18/18-She is here for initial evaluation of the left great toe ulcer. She is a poor historian in regards to timeframe in detail. She states approximately 4 weeks ago she lacerated her toe on something in the house. She followed up with her primary care who placed her on Bactrim and ultimately a second dose of Bactrim prior to coming to wound clinic. She states she has been treating the toe with peroxide, Betadine and a Band-Aid. She did not check her blood sugar this morning but checked it yesterday morning it was 327; she is unaware of a recent A1c and there are no current records. She saw Dr. she would've orthopedics last week for an old injury to the left ankle, she states he did not see her toe, nor did she bring it to his attention.  She smokes approximately 1 pack cigarettes ZYLA, ZARZA A ([id number]) [id number] Page 12 of 22 a day. Her social situation is concerning, she arrives this morning with her mother who appears extremely intoxicated/under the influence; her mother was asked to leave the room and be monitored by the patient's grandmother. The patient's aunt then accompanied the patient and the room throughout the rest of the appointment. We had a lengthy discussion regarding the deleterious effects of uncontrolled hyperglycemia and smoking as it relates to wound healing and overall health. She was strongly encouraged to decrease her smoking and get her diabetes under better control. She states she is currently on a diet and has cut down her [facility] consumption. The left toe is erythematous, macerated and slightly edematous with malodor present. The edema in her left foot is below her baseline, there is no erythema streaking. We will treat her with Santyl, doxycycline; we have ordered and xray, culture and provided a Peg assist surgical shoe and cultured the wound. 01/25/18-She is here in follow-up evaluation for a left great toe ulcer and presents with an abscess to her suprapubic area. 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 03/16/2022 No Yes of skin W54.0XXA Bitten by dog, initial encounter 05/17/2023 No Yes S81.801A Unspecified open wound, right lower leg, initial encounter 05/17/2023 No Yes E11.622  Type 2 diabetes mellitus with other skin ulcer 05/17/2023 No Yes E11.42 Type 2 diabetes mellitus with diabetic polyneuropathy 03/16/2022 No Yes Z79.4 Long term (current) use of insulin 02/22/2023 No Yes Inactive Problems Resolved Problems Electronic Signature(s) Signed: 07/05/2023 1:14:22 PM By: Geralyn Corwin DO Entered By: Geralyn Corwin on 07/05/2023 12:10:09 -------------------------------------------------------------------------------- Progress Note Details Patient Name: Date of Service: [name] 07/05/2023 11:00 A M Medical Record Number: [id number] Patient Account Number: [id number] Date of Birth/Sex: Treating RN: May 27, 1976 (47 y.o. [name] Primary Care Provider: [name] Other Clinician: [name] Referring Provider: Treating Provider/Extender: Chapman Moss in Treatment: 29 Subjective Chief Complaint Information obtained from Patient 03/19/2021; patient referred by Dr. Excell Seltzer who has been looking after her left foot for quite a period of time for review of a nonhealing area in the left midfoot 03/12/2022; bilateral feet wounds and right lower extremity wound. History of Present Illness (HPI) 01/18/18-She is here for initial evaluation of the left great toe ulcer. She is a poor historian in regards to timeframe in detail. She states approximately 4 weeks ago she lacerated her toe on something in the house. She followed up with her primary care who placed her on Bactrim and ultimately a second dose of Bactrim prior to coming to wound clinic. She states she has been treating the toe with peroxide, Betadine and a Band-Aid. She did not check her blood sugar this morning but checked it yesterday morning it was 327; she is unaware of a recent A1c and there are no current records. She saw Dr. she would've orthopedics last week for an old injury to the left ankle, she states he did not see her toe, nor did she bring it to his attention.  She smokes approximately 1 pack cigarettes ZYLA, ZARZA A ([id number]) [id number] Page 12 of 22 a day. Her social situation is concerning, she arrives this morning with her mother who appears extremely intoxicated/under the influence; her mother was asked to leave the room and be monitored by the patient's grandmother. The patient's aunt then accompanied the patient and the room throughout the rest of the appointment. We had a lengthy discussion regarding the deleterious effects of uncontrolled hyperglycemia and smoking as it relates to wound healing and overall health. She was strongly encouraged to decrease her smoking and get her diabetes under better control. She states she is currently on a diet and has cut down her [facility] consumption. The left toe is erythematous, macerated and slightly edematous with malodor present. The edema in her left foot is below her baseline, there is no erythema streaking. We will treat her with Santyl, doxycycline; we have ordered and xray, culture and provided a Peg assist surgical shoe and cultured the wound. 01/25/18-She is here in follow-up evaluation for a left great toe ulcer and presents with an abscess to her suprapubic area. 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 03/16/2022 No Yes of skin W54.0XXA Bitten by dog, initial encounter 05/17/2023 No Yes S81.801A Unspecified open wound, right lower leg, initial encounter 05/17/2023 No Yes E11.622  Type 2 diabetes mellitus with other skin ulcer 05/17/2023 No Yes E11.42 Type 2 diabetes mellitus with diabetic polyneuropathy 03/16/2022 No Yes Z79.4 Long term (current) use of insulin 02/22/2023 No Yes Inactive Problems Resolved Problems Electronic Signature(s) Signed: 07/05/2023 1:14:22 PM By: Geralyn Corwin DO Entered By: Geralyn Corwin on 07/05/2023 12:10:09 -------------------------------------------------------------------------------- Progress Note Details Patient Name: Date of Service: [name] 07/05/2023 11:00 A M Medical Record Number: [id number] Patient Account Number: [id number] Date of Birth/Sex: Treating RN: May 27, 1976 (47 y.o. [name] Primary Care Provider: [name] Other Clinician: [name] Referring Provider: Treating Provider/Extender: Chapman Moss in Treatment: 29 Subjective Chief Complaint Information obtained from Patient 03/19/2021; patient referred by Dr. Excell Seltzer who has been looking after her left foot for quite a period of time for review of a nonhealing area in the left midfoot 03/12/2022; bilateral feet wounds and right lower extremity wound. History of Present Illness (HPI) 01/18/18-She is here for initial evaluation of the left great toe ulcer. She is a poor historian in regards to timeframe in detail. She states approximately 4 weeks ago she lacerated her toe on something in the house. She followed up with her primary care who placed her on Bactrim and ultimately a second dose of Bactrim prior to coming to wound clinic. She states she has been treating the toe with peroxide, Betadine and a Band-Aid. She did not check her blood sugar this morning but checked it yesterday morning it was 327; she is unaware of a recent A1c and there are no current records. She saw Dr. she would've orthopedics last week for an old injury to the left ankle, she states he did not see her toe, nor did she bring it to his attention.  She smokes approximately 1 pack cigarettes ZYLA, ZARZA A ([id number]) [id number] Page 12 of 22 a day. Her social situation is concerning, she arrives this morning with her mother who appears extremely intoxicated/under the influence; her mother was asked to leave the room and be monitored by the patient's grandmother. The patient's aunt then accompanied the patient and the room throughout the rest of the appointment. We had a lengthy discussion regarding the deleterious effects of uncontrolled hyperglycemia and smoking as it relates to wound healing and overall health. She was strongly encouraged to decrease her smoking and get her diabetes under better control. She states she is currently on a diet and has cut down her [facility] consumption. The left toe is erythematous, macerated and slightly edematous with malodor present. The edema in her left foot is below her baseline, there is no erythema streaking. We will treat her with Santyl, doxycycline; we have ordered and xray, culture and provided a Peg assist surgical shoe and cultured the wound. 01/25/18-She is here in follow-up evaluation for a left great toe ulcer and presents with an abscess to her suprapubic area. 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 03/16/2022 No Yes of skin W54.0XXA Bitten by dog, initial encounter 05/17/2023 No Yes S81.801A Unspecified open wound, right lower leg, initial encounter 05/17/2023 No Yes E11.622  Type 2 diabetes mellitus with other skin ulcer 05/17/2023 No Yes E11.42 Type 2 diabetes mellitus with diabetic polyneuropathy 03/16/2022 No Yes Z79.4 Long term (current) use of insulin 02/22/2023 No Yes Inactive Problems Resolved Problems Electronic Signature(s) Signed: 07/05/2023 1:14:22 PM By: Geralyn Corwin DO Entered By: Geralyn Corwin on 07/05/2023 12:10:09 -------------------------------------------------------------------------------- Progress Note Details Patient Name: Date of Service: [name] 07/05/2023 11:00 A M Medical Record Number: [id number] Patient Account Number: [id number] Date of Birth/Sex: Treating RN: May 27, 1976 (47 y.o. [name] Primary Care Provider: [name] Other Clinician: [name] Referring Provider: Treating Provider/Extender: Chapman Moss in Treatment: 29 Subjective Chief Complaint Information obtained from Patient 03/19/2021; patient referred by Dr. Excell Seltzer who has been looking after her left foot for quite a period of time for review of a nonhealing area in the left midfoot 03/12/2022; bilateral feet wounds and right lower extremity wound. History of Present Illness (HPI) 01/18/18-She is here for initial evaluation of the left great toe ulcer. She is a poor historian in regards to timeframe in detail. She states approximately 4 weeks ago she lacerated her toe on something in the house. She followed up with her primary care who placed her on Bactrim and ultimately a second dose of Bactrim prior to coming to wound clinic. She states she has been treating the toe with peroxide, Betadine and a Band-Aid. She did not check her blood sugar this morning but checked it yesterday morning it was 327; she is unaware of a recent A1c and there are no current records. She saw Dr. she would've orthopedics last week for an old injury to the left ankle, she states he did not see her toe, nor did she bring it to his attention.  She smokes approximately 1 pack cigarettes ZYLA, ZARZA A ([id number]) [id number] Page 12 of 22 a day. Her social situation is concerning, she arrives this morning with her mother who appears extremely intoxicated/under the influence; her mother was asked to leave the room and be monitored by the patient's grandmother. The patient's aunt then accompanied the patient and the room throughout the rest of the appointment. We had a lengthy discussion regarding the deleterious effects of uncontrolled hyperglycemia and smoking as it relates to wound healing and overall health. She was strongly encouraged to decrease her smoking and get her diabetes under better control. She states she is currently on a diet and has cut down her [facility] consumption. The left toe is erythematous, macerated and slightly edematous with malodor present. The edema in her left foot is below her baseline, there is no erythema streaking. We will treat her with Santyl, doxycycline; we have ordered and xray, culture and provided a Peg assist surgical shoe and cultured the wound. 01/25/18-She is here in follow-up evaluation for a left great toe ulcer and presents with an abscess to her suprapubic area. She states her blood sugars remain elevated,  oral antibiotics. She has no issues or complaints today. Wounds XIANA, SAWIN A ([id number]) [id number] Page 9 of 22 continue to improve albeit slowly. 8/14; 8/14; patient has 3 wounds 1 on the left plantar heel 1 on the right plantar heel and a small wound on the right anterior lower leg. We have been using Hydrofera Blue in the left heel Aquacel on the right heel. The left heel has been in a total contact cast 8/21; patient presents for follow-up. We have been using Hydrofera Blue to the left foot wound under the total contact cast. T the right heel patient has been o using Aquacel Ag. T the anterior leg wound antibiotic ointment. All wounds have healed. o 8/28; patient presents for follow-up. She has been using her surgical shoe with offloading felt pads. Her wounds have remained closed. 9/11; patient presents for final follow-up to assure that her wounds have remained closed. Unfortunately the left foot wound has opened again. But she has been wearing her Surgical shoe for offloading with felt pad. Electronic Signature(s) Signed: 07/05/2023 1:14:22 PM By: Geralyn Corwin DO Entered By:  Geralyn Corwin on 07/05/2023 12:12:13 -------------------------------------------------------------------------------- Physical Exam Details Patient Name: Date of Service: [name] 07/05/2023 11:00 A M Medical Record Number: [id number] Patient Account Number: [id number] Date of Birth/Sex: Treating RN: Apr 15, 1976 (47 y.o. [name] Primary Care Provider: [name] Other Clinician: [name] Referring Provider: Treating Provider/Extender: Chapman Moss in Treatment: 88 Constitutional . Cardiovascular . Psychiatric . Notes Previous right plantar foot wound remains healed. T the left plantar foot there is an open wound with callus, nonviable tissue and granulation tissue. No signs o of surrounding infection. Postdebridement Healthy Granulation tissue present. Electronic Signature(s) Signed: 07/05/2023 1:14:22 PM By: Geralyn Corwin DO Entered By: Geralyn Corwin on 07/05/2023 12:13:05 -------------------------------------------------------------------------------- Physician Orders Details Patient Name: Date of Service: [name] 07/05/2023 11:00 A M Medical Record Number: [id number] Patient Account Number: [id number] Date of Birth/Sex: Treating RN: [birth year] (47 y.o. [name] Primary Care Provider: [name] Other Clinician: Jaqulyn, Skurka A ([id number]) [id number] Page 10 of 22 Referring Provider: Treating Provider/Extender: Chapman Moss in Treatment: 41 Verbal / Phone Orders: Yes Clinician: Yevonne Pax Read Back and Verified: Yes Diagnosis Coding Follow-up Appointments Return Appointment in 2 weeks. Bathing/ Shower/ Hygiene May shower; gently cleanse wound with antibacterial soap, rinse and pat dry prior to dressing wounds No tub bath. Off-Loading Total Contact Cast to Left Lower Extremity Open toe surgical shoe - right,  left Other: - felt offloading pad to left foot Wound Treatment Wound #12R - Foot Wound Laterality: Plantar, Left, Medial Cleanser: Vashe 5.8 (oz) Discharge Instructions: Use vashe 5.8 (oz) as directed Topical: Gentamicin Discharge Instructions: Apply as directed by provider. Topical: Mupirocin Ointment Discharge Instructions: Apply as directed by provider. Prim Dressing: Hydrofera Blue Ready Transfer Foam, 2.5x2.5 (in/in) ary Discharge Instructions: Apply Hydrofera Blue Ready to wound bed as directed Secondary Dressing: ABD Pad 5x9 (in/in) Discharge Instructions: Cover with ABD pad Electronic Signature(s) Signed: 07/05/2023 1:14:22 PM By: Geralyn Corwin DO Entered By: Geralyn Corwin on 07/05/2023 12:38:41 -------------------------------------------------------------------------------- Problem List Details Patient Name: Date of Service: [name] 07/05/2023 11:00 A M Medical Record Number: [id number] Patient Account Number: [id number] Date of Birth/Sex: Treating RN: 07-28-1976 (46 y.o. [name] Primary Care Provider: [name] Other Clinician: [name] Referring Provider: Treating Provider/Extender: Chapman Moss in Treatment: 66 Active Problems ICD-10 Encounter Code Description  oral antibiotics. She has no issues or complaints today. Wounds XIANA, SAWIN A ([id number]) [id number] Page 9 of 22 continue to improve albeit slowly. 8/14; 8/14; patient has 3 wounds 1 on the left plantar heel 1 on the right plantar heel and a small wound on the right anterior lower leg. We have been using Hydrofera Blue in the left heel Aquacel on the right heel. The left heel has been in a total contact cast 8/21; patient presents for follow-up. We have been using Hydrofera Blue to the left foot wound under the total contact cast. T the right heel patient has been o using Aquacel Ag. T the anterior leg wound antibiotic ointment. All wounds have healed. o 8/28; patient presents for follow-up. She has been using her surgical shoe with offloading felt pads. Her wounds have remained closed. 9/11; patient presents for final follow-up to assure that her wounds have remained closed. Unfortunately the left foot wound has opened again. But she has been wearing her Surgical shoe for offloading with felt pad. Electronic Signature(s) Signed: 07/05/2023 1:14:22 PM By: Geralyn Corwin DO Entered By:  Geralyn Corwin on 07/05/2023 12:12:13 -------------------------------------------------------------------------------- Physical Exam Details Patient Name: Date of Service: [name] 07/05/2023 11:00 A M Medical Record Number: [id number] Patient Account Number: [id number] Date of Birth/Sex: Treating RN: Apr 15, 1976 (47 y.o. [name] Primary Care Provider: [name] Other Clinician: [name] Referring Provider: Treating Provider/Extender: Chapman Moss in Treatment: 88 Constitutional . Cardiovascular . Psychiatric . Notes Previous right plantar foot wound remains healed. T the left plantar foot there is an open wound with callus, nonviable tissue and granulation tissue. No signs o of surrounding infection. Postdebridement Healthy Granulation tissue present. Electronic Signature(s) Signed: 07/05/2023 1:14:22 PM By: Geralyn Corwin DO Entered By: Geralyn Corwin on 07/05/2023 12:13:05 -------------------------------------------------------------------------------- Physician Orders Details Patient Name: Date of Service: [name] 07/05/2023 11:00 A M Medical Record Number: [id number] Patient Account Number: [id number] Date of Birth/Sex: Treating RN: [birth year] (47 y.o. [name] Primary Care Provider: [name] Other Clinician: Jaqulyn, Skurka A ([id number]) [id number] Page 10 of 22 Referring Provider: Treating Provider/Extender: Chapman Moss in Treatment: 41 Verbal / Phone Orders: Yes Clinician: Yevonne Pax Read Back and Verified: Yes Diagnosis Coding Follow-up Appointments Return Appointment in 2 weeks. Bathing/ Shower/ Hygiene May shower; gently cleanse wound with antibacterial soap, rinse and pat dry prior to dressing wounds No tub bath. Off-Loading Total Contact Cast to Left Lower Extremity Open toe surgical shoe - right,  left Other: - felt offloading pad to left foot Wound Treatment Wound #12R - Foot Wound Laterality: Plantar, Left, Medial Cleanser: Vashe 5.8 (oz) Discharge Instructions: Use vashe 5.8 (oz) as directed Topical: Gentamicin Discharge Instructions: Apply as directed by provider. Topical: Mupirocin Ointment Discharge Instructions: Apply as directed by provider. Prim Dressing: Hydrofera Blue Ready Transfer Foam, 2.5x2.5 (in/in) ary Discharge Instructions: Apply Hydrofera Blue Ready to wound bed as directed Secondary Dressing: ABD Pad 5x9 (in/in) Discharge Instructions: Cover with ABD pad Electronic Signature(s) Signed: 07/05/2023 1:14:22 PM By: Geralyn Corwin DO Entered By: Geralyn Corwin on 07/05/2023 12:38:41 -------------------------------------------------------------------------------- Problem List Details Patient Name: Date of Service: [name] 07/05/2023 11:00 A M Medical Record Number: [id number] Patient Account Number: [id number] Date of Birth/Sex: Treating RN: 07-28-1976 (46 y.o. 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 03/16/2022 No Yes of skin W54.0XXA Bitten by dog, initial encounter 05/17/2023 No Yes S81.801A Unspecified open wound, right lower leg, initial encounter 05/17/2023 No Yes E11.622  Type 2 diabetes mellitus with other skin ulcer 05/17/2023 No Yes E11.42 Type 2 diabetes mellitus with diabetic polyneuropathy 03/16/2022 No Yes Z79.4 Long term (current) use of insulin 02/22/2023 No Yes Inactive Problems Resolved Problems Electronic Signature(s) Signed: 07/05/2023 1:14:22 PM By: Geralyn Corwin DO Entered By: Geralyn Corwin on 07/05/2023 12:10:09 -------------------------------------------------------------------------------- Progress Note Details Patient Name: Date of Service: [name] 07/05/2023 11:00 A M Medical Record Number: [id number] Patient Account Number: [id number] Date of Birth/Sex: Treating RN: May 27, 1976 (47 y.o. [name] Primary Care Provider: [name] Other Clinician: [name] Referring Provider: Treating Provider/Extender: Chapman Moss in Treatment: 29 Subjective Chief Complaint Information obtained from Patient 03/19/2021; patient referred by Dr. Excell Seltzer who has been looking after her left foot for quite a period of time for review of a nonhealing area in the left midfoot 03/12/2022; bilateral feet wounds and right lower extremity wound. History of Present Illness (HPI) 01/18/18-She is here for initial evaluation of the left great toe ulcer. She is a poor historian in regards to timeframe in detail. She states approximately 4 weeks ago she lacerated her toe on something in the house. She followed up with her primary care who placed her on Bactrim and ultimately a second dose of Bactrim prior to coming to wound clinic. She states she has been treating the toe with peroxide, Betadine and a Band-Aid. She did not check her blood sugar this morning but checked it yesterday morning it was 327; she is unaware of a recent A1c and there are no current records. She saw Dr. she would've orthopedics last week for an old injury to the left ankle, she states he did not see her toe, nor did she bring it to his attention.  She smokes approximately 1 pack cigarettes ZYLA, ZARZA A ([id number]) [id number] Page 12 of 22 a day. Her social situation is concerning, she arrives this morning with her mother who appears extremely intoxicated/under the influence; her mother was asked to leave the room and be monitored by the patient's grandmother. The patient's aunt then accompanied the patient and the room throughout the rest of the appointment. We had a lengthy discussion regarding the deleterious effects of uncontrolled hyperglycemia and smoking as it relates to wound healing and overall health. She was strongly encouraged to decrease her smoking and get her diabetes under better control. She states she is currently on a diet and has cut down her [facility] consumption. The left toe is erythematous, macerated and slightly edematous with malodor present. The edema in her left foot is below her baseline, there is no erythema streaking. We will treat her with Santyl, doxycycline; we have ordered and xray, culture and provided a Peg assist surgical shoe and cultured the wound. 01/25/18-She is here in follow-up evaluation for a left great toe ulcer and presents with an abscess to her suprapubic area. She states her blood sugars remain elevated,  MICHIAH, BEDA A ([id number]) [id number] Page 1 of 22 Visit Report for 07/05/2023 Chief Complaint Document Details Patient Name: Date of Service: ANDRA, BELOUS 07/05/2023 11:00 A M Medical Record Number: [id number] Patient Account Number: [id number] Date of Birth/Sex: Treating RN: 1976-06-23 (47 y.o. [name] Primary Care Provider: [name] Other Clinician: [name] Referring Provider: Treating Provider/Extender: Chapman Moss in Treatment: 22 Information Obtained from: Patient Chief Complaint 03/19/2021; patient referred by Dr. Excell Seltzer who has been looking after her left foot for quite a period of time for review of a nonhealing area in the left midfoot 03/12/2022; bilateral feet wounds and right lower extremity wound. Electronic Signature(s) Signed: 07/05/2023 1:14:22 PM By: Geralyn Corwin DO Entered By: Geralyn Corwin on 07/05/2023 12:10:17 -------------------------------------------------------------------------------- Debridement Details Patient Name: Date of Service: [name], Silva Bandy A. 07/05/2023 11:00 A M Medical Record Number: [id number] Patient Account Number: [id number] Date of Birth/Sex: Treating RN: [birth year] (48 y.o. [name] Primary Care Provider: [name] Other Clinician: [name] Referring Provider: Treating Provider/Extender: Chapman Moss in Treatment: 68 Debridement Performed for Assessment: Wound #12R Left,Medial,Plantar Foot Performed By: Physician Geralyn Corwin, MD Debridement Type: Debridement Severity of Tissue Pre Debridement: Fat layer exposed Level of Consciousness (Pre-procedure): Awake and Alert Pre-procedure Verification/Time Out Yes - 11:41 Taken: Start Time: 11:41 Percent of Wound Bed Debrided: 100% T Area Debrided (cm): otal 2.36 Tissue and other material debrided: Viable, Non-Viable, Callus, Slough, Subcutaneous,  Slough Level: Skin/Subcutaneous Tissue Debridement Description: Excisional Instrument: Curette Bleeding: Minimum Hemostasis Achieved: Pressure Response to Treatment: Procedure was tolerated well Level of Consciousness (Post- Awake and Alert NEVAEH, POINDEXTER A ([id number]) [id number] Page 2 of 22 Awake and Alert procedure): Post Debridement Measurements of Total Wound Length: (cm) 2 Width: (cm) 1.5 Depth: (cm) 0.3 Volume: (cm) 0.707 Character of Wound/Ulcer Post Debridement: Stable Severity of Tissue Post Debridement: Fat layer exposed Post Procedure Diagnosis Same as Pre-procedure Electronic Signature(s) Signed: 07/05/2023 1:14:22 PM By: Geralyn Corwin DO Signed: 07/05/2023 4:52:51 PM By: [name] Signed: 07/07/2023 1:15:19 PM By: Yevonne Pax RN Entered By: [name] on 07/05/2023 11:51:01 -------------------------------------------------------------------------------- HPI Details Patient Name: Date of Service: [name], Karuna A. 07/05/2023 11:00 A M Medical Record Number: [id number] Patient Account Number: [id number] Date of Birth/Sex: Treating RN: 01-17-1976 (47 y.o. [name] Primary Care Provider: [name] Other Clinician: [name] Referring Provider: Treating Provider/Extender: Chapman Moss in Treatment: 1 History of Present Illness HPI Description: 01/18/18-She is here for initial evaluation of the left great toe ulcer. She is a poor historian in regards to timeframe in detail. She states approximately 4 weeks ago she lacerated her toe on something in the house. She followed up with her primary care who placed her on Bactrim and ultimately a second dose of Bactrim prior to coming to wound clinic. She states she has been treating the toe with peroxide, Betadine and a Band-Aid. She did not check her blood sugar this morning but checked it yesterday morning it was 327; she is unaware of a recent  A1c and there are no current records. She saw Dr. she would've orthopedics last week for an old injury to the left ankle, she states he did not see her toe, nor did she bring it to his attention. She smokes approximately 1 pack cigarettes a day. Her social situation is concerning, she arrives this morning with her mother who appears extremely intoxicated/under the influence; her mother was asked to leave the room  oral antibiotics. She has no issues or complaints today. Wounds XIANA, SAWIN A ([id number]) [id number] Page 9 of 22 continue to improve albeit slowly. 8/14; 8/14; patient has 3 wounds 1 on the left plantar heel 1 on the right plantar heel and a small wound on the right anterior lower leg. We have been using Hydrofera Blue in the left heel Aquacel on the right heel. The left heel has been in a total contact cast 8/21; patient presents for follow-up. We have been using Hydrofera Blue to the left foot wound under the total contact cast. T the right heel patient has been o using Aquacel Ag. T the anterior leg wound antibiotic ointment. All wounds have healed. o 8/28; patient presents for follow-up. She has been using her surgical shoe with offloading felt pads. Her wounds have remained closed. 9/11; patient presents for final follow-up to assure that her wounds have remained closed. Unfortunately the left foot wound has opened again. But she has been wearing her Surgical shoe for offloading with felt pad. Electronic Signature(s) Signed: 07/05/2023 1:14:22 PM By: Geralyn Corwin DO Entered By:  Geralyn Corwin on 07/05/2023 12:12:13 -------------------------------------------------------------------------------- Physical Exam Details Patient Name: Date of Service: [name] 07/05/2023 11:00 A M Medical Record Number: [id number] Patient Account Number: [id number] Date of Birth/Sex: Treating RN: Apr 15, 1976 (47 y.o. [name] Primary Care Provider: [name] Other Clinician: [name] Referring Provider: Treating Provider/Extender: Chapman Moss in Treatment: 88 Constitutional . Cardiovascular . Psychiatric . Notes Previous right plantar foot wound remains healed. T the left plantar foot there is an open wound with callus, nonviable tissue and granulation tissue. No signs o of surrounding infection. Postdebridement Healthy Granulation tissue present. Electronic Signature(s) Signed: 07/05/2023 1:14:22 PM By: Geralyn Corwin DO Entered By: Geralyn Corwin on 07/05/2023 12:13:05 -------------------------------------------------------------------------------- Physician Orders Details Patient Name: Date of Service: [name] 07/05/2023 11:00 A M Medical Record Number: [id number] Patient Account Number: [id number] Date of Birth/Sex: Treating RN: [birth year] (47 y.o. [name] Primary Care Provider: [name] Other Clinician: Jaqulyn, Skurka A ([id number]) [id number] Page 10 of 22 Referring Provider: Treating Provider/Extender: Chapman Moss in Treatment: 41 Verbal / Phone Orders: Yes Clinician: Yevonne Pax Read Back and Verified: Yes Diagnosis Coding Follow-up Appointments Return Appointment in 2 weeks. Bathing/ Shower/ Hygiene May shower; gently cleanse wound with antibacterial soap, rinse and pat dry prior to dressing wounds No tub bath. Off-Loading Total Contact Cast to Left Lower Extremity Open toe surgical shoe - right,  left Other: - felt offloading pad to left foot Wound Treatment Wound #12R - Foot Wound Laterality: Plantar, Left, Medial Cleanser: Vashe 5.8 (oz) Discharge Instructions: Use vashe 5.8 (oz) as directed Topical: Gentamicin Discharge Instructions: Apply as directed by provider. Topical: Mupirocin Ointment Discharge Instructions: Apply as directed by provider. Prim Dressing: Hydrofera Blue Ready Transfer Foam, 2.5x2.5 (in/in) ary Discharge Instructions: Apply Hydrofera Blue Ready to wound bed as directed Secondary Dressing: ABD Pad 5x9 (in/in) Discharge Instructions: Cover with ABD pad Electronic Signature(s) Signed: 07/05/2023 1:14:22 PM By: Geralyn Corwin DO Entered By: Geralyn Corwin on 07/05/2023 12:38:41 -------------------------------------------------------------------------------- Problem List Details Patient Name: Date of Service: [name] 07/05/2023 11:00 A M Medical Record Number: [id number] Patient Account Number: [id number] Date of Birth/Sex: Treating RN: 07-28-1976 (46 y.o. 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 03/16/2022 No Yes of skin W54.0XXA Bitten by dog, initial encounter 05/17/2023 No Yes S81.801A Unspecified open wound, right lower leg, initial encounter 05/17/2023 No Yes E11.622  Type 2 diabetes mellitus with other skin ulcer 05/17/2023 No Yes E11.42 Type 2 diabetes mellitus with diabetic polyneuropathy 03/16/2022 No Yes Z79.4 Long term (current) use of insulin 02/22/2023 No Yes Inactive Problems Resolved Problems Electronic Signature(s) Signed: 07/05/2023 1:14:22 PM By: Geralyn Corwin DO Entered By: Geralyn Corwin on 07/05/2023 12:10:09 -------------------------------------------------------------------------------- Progress Note Details Patient Name: Date of Service: [name] 07/05/2023 11:00 A M Medical Record Number: [id number] Patient Account Number: [id number] Date of Birth/Sex: Treating RN: May 27, 1976 (47 y.o. [name] Primary Care Provider: [name] Other Clinician: [name] Referring Provider: Treating Provider/Extender: Chapman Moss in Treatment: 29 Subjective Chief Complaint Information obtained from Patient 03/19/2021; patient referred by Dr. Excell Seltzer who has been looking after her left foot for quite a period of time for review of a nonhealing area in the left midfoot 03/12/2022; bilateral feet wounds and right lower extremity wound. History of Present Illness (HPI) 01/18/18-She is here for initial evaluation of the left great toe ulcer. She is a poor historian in regards to timeframe in detail. She states approximately 4 weeks ago she lacerated her toe on something in the house. She followed up with her primary care who placed her on Bactrim and ultimately a second dose of Bactrim prior to coming to wound clinic. She states she has been treating the toe with peroxide, Betadine and a Band-Aid. She did not check her blood sugar this morning but checked it yesterday morning it was 327; she is unaware of a recent A1c and there are no current records. She saw Dr. she would've orthopedics last week for an old injury to the left ankle, she states he did not see her toe, nor did she bring it to his attention.  She smokes approximately 1 pack cigarettes ZYLA, ZARZA A ([id number]) [id number] Page 12 of 22 a day. Her social situation is concerning, she arrives this morning with her mother who appears extremely intoxicated/under the influence; her mother was asked to leave the room and be monitored by the patient's grandmother. The patient's aunt then accompanied the patient and the room throughout the rest of the appointment. We had a lengthy discussion regarding the deleterious effects of uncontrolled hyperglycemia and smoking as it relates to wound healing and overall health. She was strongly encouraged to decrease her smoking and get her diabetes under better control. She states she is currently on a diet and has cut down her [facility] consumption. The left toe is erythematous, macerated and slightly edematous with malodor present. The edema in her left foot is below her baseline, there is no erythema streaking. We will treat her with Santyl, doxycycline; we have ordered and xray, culture and provided a Peg assist surgical shoe and cultured the wound. 01/25/18-She is here in follow-up evaluation for a left great toe ulcer and presents with an abscess to her suprapubic area. She states her blood sugars remain elevated,  oral antibiotics. She has no issues or complaints today. Wounds XIANA, SAWIN A ([id number]) [id number] Page 9 of 22 continue to improve albeit slowly. 8/14; 8/14; patient has 3 wounds 1 on the left plantar heel 1 on the right plantar heel and a small wound on the right anterior lower leg. We have been using Hydrofera Blue in the left heel Aquacel on the right heel. The left heel has been in a total contact cast 8/21; patient presents for follow-up. We have been using Hydrofera Blue to the left foot wound under the total contact cast. T the right heel patient has been o using Aquacel Ag. T the anterior leg wound antibiotic ointment. All wounds have healed. o 8/28; patient presents for follow-up. She has been using her surgical shoe with offloading felt pads. Her wounds have remained closed. 9/11; patient presents for final follow-up to assure that her wounds have remained closed. Unfortunately the left foot wound has opened again. But she has been wearing her Surgical shoe for offloading with felt pad. Electronic Signature(s) Signed: 07/05/2023 1:14:22 PM By: Geralyn Corwin DO Entered By:  Geralyn Corwin on 07/05/2023 12:12:13 -------------------------------------------------------------------------------- Physical Exam Details Patient Name: Date of Service: [name] 07/05/2023 11:00 A M Medical Record Number: [id number] Patient Account Number: [id number] Date of Birth/Sex: Treating RN: Apr 15, 1976 (47 y.o. [name] Primary Care Provider: [name] Other Clinician: [name] Referring Provider: Treating Provider/Extender: Chapman Moss in Treatment: 88 Constitutional . Cardiovascular . Psychiatric . Notes Previous right plantar foot wound remains healed. T the left plantar foot there is an open wound with callus, nonviable tissue and granulation tissue. No signs o of surrounding infection. Postdebridement Healthy Granulation tissue present. Electronic Signature(s) Signed: 07/05/2023 1:14:22 PM By: Geralyn Corwin DO Entered By: Geralyn Corwin on 07/05/2023 12:13:05 -------------------------------------------------------------------------------- Physician Orders Details Patient Name: Date of Service: [name] 07/05/2023 11:00 A M Medical Record Number: [id number] Patient Account Number: [id number] Date of Birth/Sex: Treating RN: [birth year] (47 y.o. [name] Primary Care Provider: [name] Other Clinician: Jaqulyn, Skurka A ([id number]) [id number] Page 10 of 22 Referring Provider: Treating Provider/Extender: Chapman Moss in Treatment: 41 Verbal / Phone Orders: Yes Clinician: Yevonne Pax Read Back and Verified: Yes Diagnosis Coding Follow-up Appointments Return Appointment in 2 weeks. Bathing/ Shower/ Hygiene May shower; gently cleanse wound with antibacterial soap, rinse and pat dry prior to dressing wounds No tub bath. Off-Loading Total Contact Cast to Left Lower Extremity Open toe surgical shoe - right,  left Other: - felt offloading pad to left foot Wound Treatment Wound #12R - Foot Wound Laterality: Plantar, Left, Medial Cleanser: Vashe 5.8 (oz) Discharge Instructions: Use vashe 5.8 (oz) as directed Topical: Gentamicin Discharge Instructions: Apply as directed by provider. Topical: Mupirocin Ointment Discharge Instructions: Apply as directed by provider. Prim Dressing: Hydrofera Blue Ready Transfer Foam, 2.5x2.5 (in/in) ary Discharge Instructions: Apply Hydrofera Blue Ready to wound bed as directed Secondary Dressing: ABD Pad 5x9 (in/in) Discharge Instructions: Cover with ABD pad Electronic Signature(s) Signed: 07/05/2023 1:14:22 PM By: Geralyn Corwin DO Entered By: Geralyn Corwin on 07/05/2023 12:38:41 -------------------------------------------------------------------------------- Problem List Details Patient Name: Date of Service: [name] 07/05/2023 11:00 A M Medical Record Number: [id number] Patient Account Number: [id number] Date of Birth/Sex: Treating RN: 07-28-1976 (46 y.o. [name] Primary Care Provider: [name] Other Clinician: [name] Referring Provider: Treating Provider/Extender: Chapman Moss in Treatment: 66 Active Problems ICD-10 Encounter Code Description  MICHIAH, BEDA A ([id number]) [id number] Page 1 of 22 Visit Report for 07/05/2023 Chief Complaint Document Details Patient Name: Date of Service: ANDRA, BELOUS 07/05/2023 11:00 A M Medical Record Number: [id number] Patient Account Number: [id number] Date of Birth/Sex: Treating RN: 1976-06-23 (47 y.o. [name] Primary Care Provider: [name] Other Clinician: [name] Referring Provider: Treating Provider/Extender: Chapman Moss in Treatment: 22 Information Obtained from: Patient Chief Complaint 03/19/2021; patient referred by Dr. Excell Seltzer who has been looking after her left foot for quite a period of time for review of a nonhealing area in the left midfoot 03/12/2022; bilateral feet wounds and right lower extremity wound. Electronic Signature(s) Signed: 07/05/2023 1:14:22 PM By: Geralyn Corwin DO Entered By: Geralyn Corwin on 07/05/2023 12:10:17 -------------------------------------------------------------------------------- Debridement Details Patient Name: Date of Service: [name], Silva Bandy A. 07/05/2023 11:00 A M Medical Record Number: [id number] Patient Account Number: [id number] Date of Birth/Sex: Treating RN: [birth year] (48 y.o. [name] Primary Care Provider: [name] Other Clinician: [name] Referring Provider: Treating Provider/Extender: Chapman Moss in Treatment: 68 Debridement Performed for Assessment: Wound #12R Left,Medial,Plantar Foot Performed By: Physician Geralyn Corwin, MD Debridement Type: Debridement Severity of Tissue Pre Debridement: Fat layer exposed Level of Consciousness (Pre-procedure): Awake and Alert Pre-procedure Verification/Time Out Yes - 11:41 Taken: Start Time: 11:41 Percent of Wound Bed Debrided: 100% T Area Debrided (cm): otal 2.36 Tissue and other material debrided: Viable, Non-Viable, Callus, Slough, Subcutaneous,  Slough Level: Skin/Subcutaneous Tissue Debridement Description: Excisional Instrument: Curette Bleeding: Minimum Hemostasis Achieved: Pressure Response to Treatment: Procedure was tolerated well Level of Consciousness (Post- Awake and Alert NEVAEH, POINDEXTER A ([id number]) [id number] Page 2 of 22 Awake and Alert procedure): Post Debridement Measurements of Total Wound Length: (cm) 2 Width: (cm) 1.5 Depth: (cm) 0.3 Volume: (cm) 0.707 Character of Wound/Ulcer Post Debridement: Stable Severity of Tissue Post Debridement: Fat layer exposed Post Procedure Diagnosis Same as Pre-procedure Electronic Signature(s) Signed: 07/05/2023 1:14:22 PM By: Geralyn Corwin DO Signed: 07/05/2023 4:52:51 PM By: [name] Signed: 07/07/2023 1:15:19 PM By: Yevonne Pax RN Entered By: [name] on 07/05/2023 11:51:01 -------------------------------------------------------------------------------- HPI Details Patient Name: Date of Service: [name], Karuna A. 07/05/2023 11:00 A M Medical Record Number: [id number] Patient Account Number: [id number] Date of Birth/Sex: Treating RN: 01-17-1976 (47 y.o. [name] Primary Care Provider: [name] Other Clinician: [name] Referring Provider: Treating Provider/Extender: Chapman Moss in Treatment: 1 History of Present Illness HPI Description: 01/18/18-She is here for initial evaluation of the left great toe ulcer. She is a poor historian in regards to timeframe in detail. She states approximately 4 weeks ago she lacerated her toe on something in the house. She followed up with her primary care who placed her on Bactrim and ultimately a second dose of Bactrim prior to coming to wound clinic. She states she has been treating the toe with peroxide, Betadine and a Band-Aid. She did not check her blood sugar this morning but checked it yesterday morning it was 327; she is unaware of a recent  A1c and there are no current records. She saw Dr. she would've orthopedics last week for an old injury to the left ankle, she states he did not see her toe, nor did she bring it to his attention. She smokes approximately 1 pack cigarettes a day. 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 03/16/2022 No Yes of skin W54.0XXA Bitten by dog, initial encounter 05/17/2023 No Yes S81.801A Unspecified open wound, right lower leg, initial encounter 05/17/2023 No Yes E11.622  Type 2 diabetes mellitus with other skin ulcer 05/17/2023 No Yes E11.42 Type 2 diabetes mellitus with diabetic polyneuropathy 03/16/2022 No Yes Z79.4 Long term (current) use of insulin 02/22/2023 No Yes Inactive Problems Resolved Problems Electronic Signature(s) Signed: 07/05/2023 1:14:22 PM By: Geralyn Corwin DO Entered By: Geralyn Corwin on 07/05/2023 12:10:09 -------------------------------------------------------------------------------- Progress Note Details Patient Name: Date of Service: [name] 07/05/2023 11:00 A M Medical Record Number: [id number] Patient Account Number: [id number] Date of Birth/Sex: Treating RN: May 27, 1976 (47 y.o. [name] Primary Care Provider: [name] Other Clinician: [name] Referring Provider: Treating Provider/Extender: Chapman Moss in Treatment: 29 Subjective Chief Complaint Information obtained from Patient 03/19/2021; patient referred by Dr. Excell Seltzer who has been looking after her left foot for quite a period of time for review of a nonhealing area in the left midfoot 03/12/2022; bilateral feet wounds and right lower extremity wound. History of Present Illness (HPI) 01/18/18-She is here for initial evaluation of the left great toe ulcer. She is a poor historian in regards to timeframe in detail. She states approximately 4 weeks ago she lacerated her toe on something in the house. She followed up with her primary care who placed her on Bactrim and ultimately a second dose of Bactrim prior to coming to wound clinic. She states she has been treating the toe with peroxide, Betadine and a Band-Aid. She did not check her blood sugar this morning but checked it yesterday morning it was 327; she is unaware of a recent A1c and there are no current records. She saw Dr. she would've orthopedics last week for an old injury to the left ankle, she states he did not see her toe, nor did she bring it to his attention.  She smokes approximately 1 pack cigarettes ZYLA, ZARZA A ([id number]) [id number] Page 12 of 22 a day. Her social situation is concerning, she arrives this morning with her mother who appears extremely intoxicated/under the influence; her mother was asked to leave the room and be monitored by the patient's grandmother. The patient's aunt then accompanied the patient and the room throughout the rest of the appointment. We had a lengthy discussion regarding the deleterious effects of uncontrolled hyperglycemia and smoking as it relates to wound healing and overall health. She was strongly encouraged to decrease her smoking and get her diabetes under better control. She states she is currently on a diet and has cut down her [facility] consumption. The left toe is erythematous, macerated and slightly edematous with malodor present. The edema in her left foot is below her baseline, there is no erythema streaking. We will treat her with Santyl, doxycycline; we have ordered and xray, culture and provided a Peg assist surgical shoe and cultured the wound. 01/25/18-She is here in follow-up evaluation for a left great toe ulcer and presents with an abscess to her suprapubic area. 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 03/16/2022 No Yes of skin W54.0XXA Bitten by dog, initial encounter 05/17/2023 No Yes S81.801A Unspecified open wound, right lower leg, initial encounter 05/17/2023 No Yes E11.622  Type 2 diabetes mellitus with other skin ulcer 05/17/2023 No Yes E11.42 Type 2 diabetes mellitus with diabetic polyneuropathy 03/16/2022 No Yes Z79.4 Long term (current) use of insulin 02/22/2023 No Yes Inactive Problems Resolved Problems Electronic Signature(s) Signed: 07/05/2023 1:14:22 PM By: Geralyn Corwin DO Entered By: Geralyn Corwin on 07/05/2023 12:10:09 -------------------------------------------------------------------------------- Progress Note Details Patient Name: Date of Service: [name] 07/05/2023 11:00 A M Medical Record Number: [id number] Patient Account Number: [id number] Date of Birth/Sex: Treating RN: May 27, 1976 (47 y.o. [name] Primary Care Provider: [name] Other Clinician: [name] Referring Provider: Treating Provider/Extender: Chapman Moss in Treatment: 29 Subjective Chief Complaint Information obtained from Patient 03/19/2021; patient referred by Dr. Excell Seltzer who has been looking after her left foot for quite a period of time for review of a nonhealing area in the left midfoot 03/12/2022; bilateral feet wounds and right lower extremity wound. History of Present Illness (HPI) 01/18/18-She is here for initial evaluation of the left great toe ulcer. She is a poor historian in regards to timeframe in detail. She states approximately 4 weeks ago she lacerated her toe on something in the house. She followed up with her primary care who placed her on Bactrim and ultimately a second dose of Bactrim prior to coming to wound clinic. She states she has been treating the toe with peroxide, Betadine and a Band-Aid. She did not check her blood sugar this morning but checked it yesterday morning it was 327; she is unaware of a recent A1c and there are no current records. She saw Dr. she would've orthopedics last week for an old injury to the left ankle, she states he did not see her toe, nor did she bring it to his attention.  She smokes approximately 1 pack cigarettes ZYLA, ZARZA A ([id number]) [id number] Page 12 of 22 a day. Her social situation is concerning, she arrives this morning with her mother who appears extremely intoxicated/under the influence; her mother was asked to leave the room and be monitored by the patient's grandmother. The patient's aunt then accompanied the patient and the room throughout the rest of the appointment. We had a lengthy discussion regarding the deleterious effects of uncontrolled hyperglycemia and smoking as it relates to wound healing and overall health. She was strongly encouraged to decrease her smoking and get her diabetes under better control. She states she is currently on a diet and has cut down her [facility] consumption. The left toe is erythematous, macerated and slightly edematous with malodor present. The edema in her left foot is below her baseline, there is no erythema streaking. We will treat her with Santyl, doxycycline; we have ordered and xray, culture and provided a Peg assist surgical shoe and cultured the wound. 01/25/18-She is here in follow-up evaluation for a left great toe ulcer and presents with an abscess to her suprapubic area. She states her blood sugars remain elevated,  MICHIAH, BEDA A ([id number]) [id number] Page 1 of 22 Visit Report for 07/05/2023 Chief Complaint Document Details Patient Name: Date of Service: ANDRA, BELOUS 07/05/2023 11:00 A M Medical Record Number: [id number] Patient Account Number: [id number] Date of Birth/Sex: Treating RN: 1976-06-23 (47 y.o. [name] Primary Care Provider: [name] Other Clinician: [name] Referring Provider: Treating Provider/Extender: Chapman Moss in Treatment: 22 Information Obtained from: Patient Chief Complaint 03/19/2021; patient referred by Dr. Excell Seltzer who has been looking after her left foot for quite a period of time for review of a nonhealing area in the left midfoot 03/12/2022; bilateral feet wounds and right lower extremity wound. Electronic Signature(s) Signed: 07/05/2023 1:14:22 PM By: Geralyn Corwin DO Entered By: Geralyn Corwin on 07/05/2023 12:10:17 -------------------------------------------------------------------------------- Debridement Details Patient Name: Date of Service: [name], Silva Bandy A. 07/05/2023 11:00 A M Medical Record Number: [id number] Patient Account Number: [id number] Date of Birth/Sex: Treating RN: [birth year] (48 y.o. [name] Primary Care Provider: [name] Other Clinician: [name] Referring Provider: Treating Provider/Extender: Chapman Moss in Treatment: 68 Debridement Performed for Assessment: Wound #12R Left,Medial,Plantar Foot Performed By: Physician Geralyn Corwin, MD Debridement Type: Debridement Severity of Tissue Pre Debridement: Fat layer exposed Level of Consciousness (Pre-procedure): Awake and Alert Pre-procedure Verification/Time Out Yes - 11:41 Taken: Start Time: 11:41 Percent of Wound Bed Debrided: 100% T Area Debrided (cm): otal 2.36 Tissue and other material debrided: Viable, Non-Viable, Callus, Slough, Subcutaneous,  Slough Level: Skin/Subcutaneous Tissue Debridement Description: Excisional Instrument: Curette Bleeding: Minimum Hemostasis Achieved: Pressure Response to Treatment: Procedure was tolerated well Level of Consciousness (Post- Awake and Alert NEVAEH, POINDEXTER A ([id number]) [id number] Page 2 of 22 Awake and Alert procedure): Post Debridement Measurements of Total Wound Length: (cm) 2 Width: (cm) 1.5 Depth: (cm) 0.3 Volume: (cm) 0.707 Character of Wound/Ulcer Post Debridement: Stable Severity of Tissue Post Debridement: Fat layer exposed Post Procedure Diagnosis Same as Pre-procedure Electronic Signature(s) Signed: 07/05/2023 1:14:22 PM By: Geralyn Corwin DO Signed: 07/05/2023 4:52:51 PM By: [name] Signed: 07/07/2023 1:15:19 PM By: Yevonne Pax RN Entered By: [name] on 07/05/2023 11:51:01 -------------------------------------------------------------------------------- HPI Details Patient Name: Date of Service: [name], Karuna A. 07/05/2023 11:00 A M Medical Record Number: [id number] Patient Account Number: [id number] Date of Birth/Sex: Treating RN: 01-17-1976 (47 y.o. [name] Primary Care Provider: [name] Other Clinician: [name] Referring Provider: Treating Provider/Extender: Chapman Moss in Treatment: 1 History of Present Illness HPI Description: 01/18/18-She is here for initial evaluation of the left great toe ulcer. She is a poor historian in regards to timeframe in detail. She states approximately 4 weeks ago she lacerated her toe on something in the house. She followed up with her primary care who placed her on Bactrim and ultimately a second dose of Bactrim prior to coming to wound clinic. She states she has been treating the toe with peroxide, Betadine and a Band-Aid. She did not check her blood sugar this morning but checked it yesterday morning it was 327; she is unaware of a recent  A1c and there are no current records. She saw Dr. she would've orthopedics last week for an old injury to the left ankle, she states he did not see her toe, nor did she bring it to his attention. She smokes approximately 1 pack cigarettes a day. Her social situation is concerning, she arrives this morning with her mother who appears extremely intoxicated/under the influence; her mother was asked to leave the room  MICHIAH, BEDA A ([id number]) [id number] Page 1 of 22 Visit Report for 07/05/2023 Chief Complaint Document Details Patient Name: Date of Service: ANDRA, BELOUS 07/05/2023 11:00 A M Medical Record Number: [id number] Patient Account Number: [id number] Date of Birth/Sex: Treating RN: 1976-06-23 (47 y.o. [name] Primary Care Provider: [name] Other Clinician: [name] Referring Provider: Treating Provider/Extender: Chapman Moss in Treatment: 22 Information Obtained from: Patient Chief Complaint 03/19/2021; patient referred by Dr. Excell Seltzer who has been looking after her left foot for quite a period of time for review of a nonhealing area in the left midfoot 03/12/2022; bilateral feet wounds and right lower extremity wound. Electronic Signature(s) Signed: 07/05/2023 1:14:22 PM By: Geralyn Corwin DO Entered By: Geralyn Corwin on 07/05/2023 12:10:17 -------------------------------------------------------------------------------- Debridement Details Patient Name: Date of Service: [name], Silva Bandy A. 07/05/2023 11:00 A M Medical Record Number: [id number] Patient Account Number: [id number] Date of Birth/Sex: Treating RN: [birth year] (48 y.o. [name] Primary Care Provider: [name] Other Clinician: [name] Referring Provider: Treating Provider/Extender: Chapman Moss in Treatment: 68 Debridement Performed for Assessment: Wound #12R Left,Medial,Plantar Foot Performed By: Physician Geralyn Corwin, MD Debridement Type: Debridement Severity of Tissue Pre Debridement: Fat layer exposed Level of Consciousness (Pre-procedure): Awake and Alert Pre-procedure Verification/Time Out Yes - 11:41 Taken: Start Time: 11:41 Percent of Wound Bed Debrided: 100% T Area Debrided (cm): otal 2.36 Tissue and other material debrided: Viable, Non-Viable, Callus, Slough, Subcutaneous,  Slough Level: Skin/Subcutaneous Tissue Debridement Description: Excisional Instrument: Curette Bleeding: Minimum Hemostasis Achieved: Pressure Response to Treatment: Procedure was tolerated well Level of Consciousness (Post- Awake and Alert NEVAEH, POINDEXTER A ([id number]) [id number] Page 2 of 22 Awake and Alert procedure): Post Debridement Measurements of Total Wound Length: (cm) 2 Width: (cm) 1.5 Depth: (cm) 0.3 Volume: (cm) 0.707 Character of Wound/Ulcer Post Debridement: Stable Severity of Tissue Post Debridement: Fat layer exposed Post Procedure Diagnosis Same as Pre-procedure Electronic Signature(s) Signed: 07/05/2023 1:14:22 PM By: Geralyn Corwin DO Signed: 07/05/2023 4:52:51 PM By: [name] Signed: 07/07/2023 1:15:19 PM By: Yevonne Pax RN Entered By: [name] on 07/05/2023 11:51:01 -------------------------------------------------------------------------------- HPI Details Patient Name: Date of Service: [name], Karuna A. 07/05/2023 11:00 A M Medical Record Number: [id number] Patient Account Number: [id number] Date of Birth/Sex: Treating RN: 01-17-1976 (47 y.o. [name] Primary Care Provider: [name] Other Clinician: [name] Referring Provider: Treating Provider/Extender: Chapman Moss in Treatment: 1 History of Present Illness HPI Description: 01/18/18-She is here for initial evaluation of the left great toe ulcer. She is a poor historian in regards to timeframe in detail. She states approximately 4 weeks ago she lacerated her toe on something in the house. She followed up with her primary care who placed her on Bactrim and ultimately a second dose of Bactrim prior to coming to wound clinic. She states she has been treating the toe with peroxide, Betadine and a Band-Aid. She did not check her blood sugar this morning but checked it yesterday morning it was 327; she is unaware of a recent  A1c and there are no current records. She saw Dr. she would've orthopedics last week for an old injury to the left ankle, she states he did not see her toe, nor did she bring it to his attention. She smokes approximately 1 pack cigarettes a day. 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 03/16/2022 No Yes of skin W54.0XXA Bitten by dog, initial encounter 05/17/2023 No Yes S81.801A Unspecified open wound, right lower leg, initial encounter 05/17/2023 No Yes E11.622  Type 2 diabetes mellitus with other skin ulcer 05/17/2023 No Yes E11.42 Type 2 diabetes mellitus with diabetic polyneuropathy 03/16/2022 No Yes Z79.4 Long term (current) use of insulin 02/22/2023 No Yes Inactive Problems Resolved Problems Electronic Signature(s) Signed: 07/05/2023 1:14:22 PM By: Geralyn Corwin DO Entered By: Geralyn Corwin on 07/05/2023 12:10:09 -------------------------------------------------------------------------------- Progress Note Details Patient Name: Date of Service: [name] 07/05/2023 11:00 A M Medical Record Number: [id number] Patient Account Number: [id number] Date of Birth/Sex: Treating RN: May 27, 1976 (47 y.o. [name] Primary Care Provider: [name] Other Clinician: [name] Referring Provider: Treating Provider/Extender: Chapman Moss in Treatment: 29 Subjective Chief Complaint Information obtained from Patient 03/19/2021; patient referred by Dr. Excell Seltzer who has been looking after her left foot for quite a period of time for review of a nonhealing area in the left midfoot 03/12/2022; bilateral feet wounds and right lower extremity wound. History of Present Illness (HPI) 01/18/18-She is here for initial evaluation of the left great toe ulcer. She is a poor historian in regards to timeframe in detail. She states approximately 4 weeks ago she lacerated her toe on something in the house. She followed up with her primary care who placed her on Bactrim and ultimately a second dose of Bactrim prior to coming to wound clinic. She states she has been treating the toe with peroxide, Betadine and a Band-Aid. She did not check her blood sugar this morning but checked it yesterday morning it was 327; she is unaware of a recent A1c and there are no current records. She saw Dr. she would've orthopedics last week for an old injury to the left ankle, she states he did not see her toe, nor did she bring it to his attention.  She smokes approximately 1 pack cigarettes ZYLA, ZARZA A ([id number]) [id number] Page 12 of 22 a day. Her social situation is concerning, she arrives this morning with her mother who appears extremely intoxicated/under the influence; her mother was asked to leave the room and be monitored by the patient's grandmother. The patient's aunt then accompanied the patient and the room throughout the rest of the appointment. We had a lengthy discussion regarding the deleterious effects of uncontrolled hyperglycemia and smoking as it relates to wound healing and overall health. She was strongly encouraged to decrease her smoking and get her diabetes under better control. She states she is currently on a diet and has cut down her [facility] consumption. The left toe is erythematous, macerated and slightly edematous with malodor present. The edema in her left foot is below her baseline, there is no erythema streaking. We will treat her with Santyl, doxycycline; we have ordered and xray, culture and provided a Peg assist surgical shoe and cultured the wound. 01/25/18-She is here in follow-up evaluation for a left great toe ulcer and presents with an abscess to her suprapubic area. 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 03/16/2022 No Yes of skin W54.0XXA Bitten by dog, initial encounter 05/17/2023 No Yes S81.801A Unspecified open wound, right lower leg, initial encounter 05/17/2023 No Yes E11.622  Type 2 diabetes mellitus with other skin ulcer 05/17/2023 No Yes E11.42 Type 2 diabetes mellitus with diabetic polyneuropathy 03/16/2022 No Yes Z79.4 Long term (current) use of insulin 02/22/2023 No Yes Inactive Problems Resolved Problems Electronic Signature(s) Signed: 07/05/2023 1:14:22 PM By: Geralyn Corwin DO Entered By: Geralyn Corwin on 07/05/2023 12:10:09 -------------------------------------------------------------------------------- Progress Note Details Patient Name: Date of Service: [name] 07/05/2023 11:00 A M Medical Record Number: [id number] Patient Account Number: [id number] Date of Birth/Sex: Treating RN: May 27, 1976 (47 y.o. [name] Primary Care Provider: [name] Other Clinician: [name] Referring Provider: Treating Provider/Extender: Chapman Moss in Treatment: 29 Subjective Chief Complaint Information obtained from Patient 03/19/2021; patient referred by Dr. Excell Seltzer who has been looking after her left foot for quite a period of time for review of a nonhealing area in the left midfoot 03/12/2022; bilateral feet wounds and right lower extremity wound. History of Present Illness (HPI) 01/18/18-She is here for initial evaluation of the left great toe ulcer. She is a poor historian in regards to timeframe in detail. She states approximately 4 weeks ago she lacerated her toe on something in the house. She followed up with her primary care who placed her on Bactrim and ultimately a second dose of Bactrim prior to coming to wound clinic. She states she has been treating the toe with peroxide, Betadine and a Band-Aid. She did not check her blood sugar this morning but checked it yesterday morning it was 327; she is unaware of a recent A1c and there are no current records. She saw Dr. she would've orthopedics last week for an old injury to the left ankle, she states he did not see her toe, nor did she bring it to his attention.  She smokes approximately 1 pack cigarettes ZYLA, ZARZA A ([id number]) [id number] Page 12 of 22 a day. Her social situation is concerning, she arrives this morning with her mother who appears extremely intoxicated/under the influence; her mother was asked to leave the room and be monitored by the patient's grandmother. The patient's aunt then accompanied the patient and the room throughout the rest of the appointment. We had a lengthy discussion regarding the deleterious effects of uncontrolled hyperglycemia and smoking as it relates to wound healing and overall health. She was strongly encouraged to decrease her smoking and get her diabetes under better control. She states she is currently on a diet and has cut down her [facility] consumption. The left toe is erythematous, macerated and slightly edematous with malodor present. The edema in her left foot is below her baseline, there is no erythema streaking. We will treat her with Santyl, doxycycline; we have ordered and xray, culture and provided a Peg assist surgical shoe and cultured the wound. 01/25/18-She is here in follow-up evaluation for a left great toe ulcer and presents with an abscess to her suprapubic area. She states her blood sugars remain elevated,  oral antibiotics. She has no issues or complaints today. Wounds XIANA, SAWIN A ([id number]) [id number] Page 9 of 22 continue to improve albeit slowly. 8/14; 8/14; patient has 3 wounds 1 on the left plantar heel 1 on the right plantar heel and a small wound on the right anterior lower leg. We have been using Hydrofera Blue in the left heel Aquacel on the right heel. The left heel has been in a total contact cast 8/21; patient presents for follow-up. We have been using Hydrofera Blue to the left foot wound under the total contact cast. T the right heel patient has been o using Aquacel Ag. T the anterior leg wound antibiotic ointment. All wounds have healed. o 8/28; patient presents for follow-up. She has been using her surgical shoe with offloading felt pads. Her wounds have remained closed. 9/11; patient presents for final follow-up to assure that her wounds have remained closed. Unfortunately the left foot wound has opened again. But she has been wearing her Surgical shoe for offloading with felt pad. Electronic Signature(s) Signed: 07/05/2023 1:14:22 PM By: Geralyn Corwin DO Entered By:  Geralyn Corwin on 07/05/2023 12:12:13 -------------------------------------------------------------------------------- Physical Exam Details Patient Name: Date of Service: [name] 07/05/2023 11:00 A M Medical Record Number: [id number] Patient Account Number: [id number] Date of Birth/Sex: Treating RN: Apr 15, 1976 (47 y.o. [name] Primary Care Provider: [name] Other Clinician: [name] Referring Provider: Treating Provider/Extender: Chapman Moss in Treatment: 88 Constitutional . Cardiovascular . Psychiatric . Notes Previous right plantar foot wound remains healed. T the left plantar foot there is an open wound with callus, nonviable tissue and granulation tissue. No signs o of surrounding infection. Postdebridement Healthy Granulation tissue present. Electronic Signature(s) Signed: 07/05/2023 1:14:22 PM By: Geralyn Corwin DO Entered By: Geralyn Corwin on 07/05/2023 12:13:05 -------------------------------------------------------------------------------- Physician Orders Details Patient Name: Date of Service: [name] 07/05/2023 11:00 A M Medical Record Number: [id number] Patient Account Number: [id number] Date of Birth/Sex: Treating RN: [birth year] (47 y.o. [name] Primary Care Provider: [name] Other Clinician: Jaqulyn, Skurka A ([id number]) [id number] Page 10 of 22 Referring Provider: Treating Provider/Extender: Chapman Moss in Treatment: 41 Verbal / Phone Orders: Yes Clinician: Yevonne Pax Read Back and Verified: Yes Diagnosis Coding Follow-up Appointments Return Appointment in 2 weeks. Bathing/ Shower/ Hygiene May shower; gently cleanse wound with antibacterial soap, rinse and pat dry prior to dressing wounds No tub bath. Off-Loading Total Contact Cast to Left Lower Extremity Open toe surgical shoe - right,  left Other: - felt offloading pad to left foot Wound Treatment Wound #12R - Foot Wound Laterality: Plantar, Left, Medial Cleanser: Vashe 5.8 (oz) Discharge Instructions: Use vashe 5.8 (oz) as directed Topical: Gentamicin Discharge Instructions: Apply as directed by provider. Topical: Mupirocin Ointment Discharge Instructions: Apply as directed by provider. Prim Dressing: Hydrofera Blue Ready Transfer Foam, 2.5x2.5 (in/in) ary Discharge Instructions: Apply Hydrofera Blue Ready to wound bed as directed Secondary Dressing: ABD Pad 5x9 (in/in) Discharge Instructions: Cover with ABD pad Electronic Signature(s) Signed: 07/05/2023 1:14:22 PM By: Geralyn Corwin DO Entered By: Geralyn Corwin on 07/05/2023 12:38:41 -------------------------------------------------------------------------------- Problem List Details Patient Name: Date of Service: [name] 07/05/2023 11:00 A M Medical Record Number: [id number] Patient Account Number: [id number] Date of Birth/Sex: Treating RN: 07-28-1976 (46 y.o. [name] Primary Care Provider: [name] Other Clinician: [name] Referring Provider: Treating Provider/Extender: Chapman Moss in Treatment: 66 Active Problems ICD-10 Encounter Code Description  oral antibiotics. She has no issues or complaints today. Wounds XIANA, SAWIN A ([id number]) [id number] Page 9 of 22 continue to improve albeit slowly. 8/14; 8/14; patient has 3 wounds 1 on the left plantar heel 1 on the right plantar heel and a small wound on the right anterior lower leg. We have been using Hydrofera Blue in the left heel Aquacel on the right heel. The left heel has been in a total contact cast 8/21; patient presents for follow-up. We have been using Hydrofera Blue to the left foot wound under the total contact cast. T the right heel patient has been o using Aquacel Ag. T the anterior leg wound antibiotic ointment. All wounds have healed. o 8/28; patient presents for follow-up. She has been using her surgical shoe with offloading felt pads. Her wounds have remained closed. 9/11; patient presents for final follow-up to assure that her wounds have remained closed. Unfortunately the left foot wound has opened again. But she has been wearing her Surgical shoe for offloading with felt pad. Electronic Signature(s) Signed: 07/05/2023 1:14:22 PM By: Geralyn Corwin DO Entered By:  Geralyn Corwin on 07/05/2023 12:12:13 -------------------------------------------------------------------------------- Physical Exam Details Patient Name: Date of Service: [name] 07/05/2023 11:00 A M Medical Record Number: [id number] Patient Account Number: [id number] Date of Birth/Sex: Treating RN: Apr 15, 1976 (47 y.o. [name] Primary Care Provider: [name] Other Clinician: [name] Referring Provider: Treating Provider/Extender: Chapman Moss in Treatment: 88 Constitutional . Cardiovascular . Psychiatric . Notes Previous right plantar foot wound remains healed. T the left plantar foot there is an open wound with callus, nonviable tissue and granulation tissue. No signs o of surrounding infection. Postdebridement Healthy Granulation tissue present. Electronic Signature(s) Signed: 07/05/2023 1:14:22 PM By: Geralyn Corwin DO Entered By: Geralyn Corwin on 07/05/2023 12:13:05 -------------------------------------------------------------------------------- Physician Orders Details Patient Name: Date of Service: [name] 07/05/2023 11:00 A M Medical Record Number: [id number] Patient Account Number: [id number] Date of Birth/Sex: Treating RN: [birth year] (47 y.o. [name] Primary Care Provider: [name] Other Clinician: Jaqulyn, Skurka A ([id number]) [id number] Page 10 of 22 Referring Provider: Treating Provider/Extender: Chapman Moss in Treatment: 41 Verbal / Phone Orders: Yes Clinician: Yevonne Pax Read Back and Verified: Yes Diagnosis Coding Follow-up Appointments Return Appointment in 2 weeks. Bathing/ Shower/ Hygiene May shower; gently cleanse wound with antibacterial soap, rinse and pat dry prior to dressing wounds No tub bath. Off-Loading Total Contact Cast to Left Lower Extremity Open toe surgical shoe - right,  left Other: - felt offloading pad to left foot Wound Treatment Wound #12R - Foot Wound Laterality: Plantar, Left, Medial Cleanser: Vashe 5.8 (oz) Discharge Instructions: Use vashe 5.8 (oz) as directed Topical: Gentamicin Discharge Instructions: Apply as directed by provider. Topical: Mupirocin Ointment Discharge Instructions: Apply as directed by provider. Prim Dressing: Hydrofera Blue Ready Transfer Foam, 2.5x2.5 (in/in) ary Discharge Instructions: Apply Hydrofera Blue Ready to wound bed as directed Secondary Dressing: ABD Pad 5x9 (in/in) Discharge Instructions: Cover with ABD pad Electronic Signature(s) Signed: 07/05/2023 1:14:22 PM By: Geralyn Corwin DO Entered By: Geralyn Corwin on 07/05/2023 12:38:41 -------------------------------------------------------------------------------- Problem List Details Patient Name: Date of Service: [name] 07/05/2023 11:00 A M Medical Record Number: [id number] Patient Account Number: [id number] Date of Birth/Sex: Treating RN: 07-28-1976 (46 y.o. [name] Primary Care Provider: [name] Other Clinician: [name] Referring Provider: Treating Provider/Extender: Chapman Moss in Treatment: 66 Active Problems ICD-10 Encounter Code Description  oral antibiotics. She has no issues or complaints today. Wounds XIANA, SAWIN A ([id number]) [id number] Page 9 of 22 continue to improve albeit slowly. 8/14; 8/14; patient has 3 wounds 1 on the left plantar heel 1 on the right plantar heel and a small wound on the right anterior lower leg. We have been using Hydrofera Blue in the left heel Aquacel on the right heel. The left heel has been in a total contact cast 8/21; patient presents for follow-up. We have been using Hydrofera Blue to the left foot wound under the total contact cast. T the right heel patient has been o using Aquacel Ag. T the anterior leg wound antibiotic ointment. All wounds have healed. o 8/28; patient presents for follow-up. She has been using her surgical shoe with offloading felt pads. Her wounds have remained closed. 9/11; patient presents for final follow-up to assure that her wounds have remained closed. Unfortunately the left foot wound has opened again. But she has been wearing her Surgical shoe for offloading with felt pad. Electronic Signature(s) Signed: 07/05/2023 1:14:22 PM By: Geralyn Corwin DO Entered By:  Geralyn Corwin on 07/05/2023 12:12:13 -------------------------------------------------------------------------------- Physical Exam Details Patient Name: Date of Service: [name] 07/05/2023 11:00 A M Medical Record Number: [id number] Patient Account Number: [id number] Date of Birth/Sex: Treating RN: Apr 15, 1976 (47 y.o. [name] Primary Care Provider: [name] Other Clinician: [name] Referring Provider: Treating Provider/Extender: Chapman Moss in Treatment: 88 Constitutional . Cardiovascular . Psychiatric . Notes Previous right plantar foot wound remains healed. T the left plantar foot there is an open wound with callus, nonviable tissue and granulation tissue. No signs o of surrounding infection. Postdebridement Healthy Granulation tissue present. Electronic Signature(s) Signed: 07/05/2023 1:14:22 PM By: Geralyn Corwin DO Entered By: Geralyn Corwin on 07/05/2023 12:13:05 -------------------------------------------------------------------------------- Physician Orders Details Patient Name: Date of Service: [name] 07/05/2023 11:00 A M Medical Record Number: [id number] Patient Account Number: [id number] Date of Birth/Sex: Treating RN: [birth year] (47 y.o. [name] Primary Care Provider: [name] Other Clinician: Jaqulyn, Skurka A ([id number]) [id number] Page 10 of 22 Referring Provider: Treating Provider/Extender: Chapman Moss in Treatment: 41 Verbal / Phone Orders: Yes Clinician: Yevonne Pax Read Back and Verified: Yes Diagnosis Coding Follow-up Appointments Return Appointment in 2 weeks. Bathing/ Shower/ Hygiene May shower; gently cleanse wound with antibacterial soap, rinse and pat dry prior to dressing wounds No tub bath. Off-Loading Total Contact Cast to Left Lower Extremity Open toe surgical shoe - right,  left Other: - felt offloading pad to left foot Wound Treatment Wound #12R - Foot Wound Laterality: Plantar, Left, Medial Cleanser: Vashe 5.8 (oz) Discharge Instructions: Use vashe 5.8 (oz) as directed Topical: Gentamicin Discharge Instructions: Apply as directed by provider. Topical: Mupirocin Ointment Discharge Instructions: Apply as directed by provider. Prim Dressing: Hydrofera Blue Ready Transfer Foam, 2.5x2.5 (in/in) ary Discharge Instructions: Apply Hydrofera Blue Ready to wound bed as directed Secondary Dressing: ABD Pad 5x9 (in/in) Discharge Instructions: Cover with ABD pad Electronic Signature(s) Signed: 07/05/2023 1:14:22 PM By: Geralyn Corwin DO Entered By: Geralyn Corwin on 07/05/2023 12:38:41 -------------------------------------------------------------------------------- Problem List Details Patient Name: Date of Service: [name] 07/05/2023 11:00 A M Medical Record Number: [id number] Patient Account Number: [id number] Date of Birth/Sex: Treating RN: 07-28-1976 (46 y.o. [name] Primary Care Provider: [name] Other Clinician: [name] Referring Provider: Treating Provider/Extender: Chapman Moss in Treatment: 66 Active Problems ICD-10 Encounter Code Description  MICHIAH, BEDA A ([id number]) [id number] Page 1 of 22 Visit Report for 07/05/2023 Chief Complaint Document Details Patient Name: Date of Service: ANDRA, BELOUS 07/05/2023 11:00 A M Medical Record Number: [id number] Patient Account Number: [id number] Date of Birth/Sex: Treating RN: 1976-06-23 (47 y.o. [name] Primary Care Provider: [name] Other Clinician: [name] Referring Provider: Treating Provider/Extender: Chapman Moss in Treatment: 22 Information Obtained from: Patient Chief Complaint 03/19/2021; patient referred by Dr. Excell Seltzer who has been looking after her left foot for quite a period of time for review of a nonhealing area in the left midfoot 03/12/2022; bilateral feet wounds and right lower extremity wound. Electronic Signature(s) Signed: 07/05/2023 1:14:22 PM By: Geralyn Corwin DO Entered By: Geralyn Corwin on 07/05/2023 12:10:17 -------------------------------------------------------------------------------- Debridement Details Patient Name: Date of Service: [name], Silva Bandy A. 07/05/2023 11:00 A M Medical Record Number: [id number] Patient Account Number: [id number] Date of Birth/Sex: Treating RN: [birth year] (48 y.o. [name] Primary Care Provider: [name] Other Clinician: [name] Referring Provider: Treating Provider/Extender: Chapman Moss in Treatment: 68 Debridement Performed for Assessment: Wound #12R Left,Medial,Plantar Foot Performed By: Physician Geralyn Corwin, MD Debridement Type: Debridement Severity of Tissue Pre Debridement: Fat layer exposed Level of Consciousness (Pre-procedure): Awake and Alert Pre-procedure Verification/Time Out Yes - 11:41 Taken: Start Time: 11:41 Percent of Wound Bed Debrided: 100% T Area Debrided (cm): otal 2.36 Tissue and other material debrided: Viable, Non-Viable, Callus, Slough, Subcutaneous,  Slough Level: Skin/Subcutaneous Tissue Debridement Description: Excisional Instrument: Curette Bleeding: Minimum Hemostasis Achieved: Pressure Response to Treatment: Procedure was tolerated well Level of Consciousness (Post- Awake and Alert NEVAEH, POINDEXTER A ([id number]) [id number] Page 2 of 22 Awake and Alert procedure): Post Debridement Measurements of Total Wound Length: (cm) 2 Width: (cm) 1.5 Depth: (cm) 0.3 Volume: (cm) 0.707 Character of Wound/Ulcer Post Debridement: Stable Severity of Tissue Post Debridement: Fat layer exposed Post Procedure Diagnosis Same as Pre-procedure Electronic Signature(s) Signed: 07/05/2023 1:14:22 PM By: Geralyn Corwin DO Signed: 07/05/2023 4:52:51 PM By: [name] Signed: 07/07/2023 1:15:19 PM By: Yevonne Pax RN Entered By: [name] on 07/05/2023 11:51:01 -------------------------------------------------------------------------------- HPI Details Patient Name: Date of Service: [name], Karuna A. 07/05/2023 11:00 A M Medical Record Number: [id number] Patient Account Number: [id number] Date of Birth/Sex: Treating RN: 01-17-1976 (47 y.o. [name] Primary Care Provider: [name] Other Clinician: [name] Referring Provider: Treating Provider/Extender: Chapman Moss in Treatment: 1 History of Present Illness HPI Description: 01/18/18-She is here for initial evaluation of the left great toe ulcer. She is a poor historian in regards to timeframe in detail. She states approximately 4 weeks ago she lacerated her toe on something in the house. She followed up with her primary care who placed her on Bactrim and ultimately a second dose of Bactrim prior to coming to wound clinic. She states she has been treating the toe with peroxide, Betadine and a Band-Aid. She did not check her blood sugar this morning but checked it yesterday morning it was 327; she is unaware of a recent  A1c and there are no current records. She saw Dr. she would've orthopedics last week for an old injury to the left ankle, she states he did not see her toe, nor did she bring it to his attention. She smokes approximately 1 pack cigarettes a day. Her social situation is concerning, she arrives this morning with her mother who appears extremely intoxicated/under the influence; her mother was asked to leave the room  oral antibiotics. She has no issues or complaints today. Wounds XIANA, SAWIN A ([id number]) [id number] Page 9 of 22 continue to improve albeit slowly. 8/14; 8/14; patient has 3 wounds 1 on the left plantar heel 1 on the right plantar heel and a small wound on the right anterior lower leg. We have been using Hydrofera Blue in the left heel Aquacel on the right heel. The left heel has been in a total contact cast 8/21; patient presents for follow-up. We have been using Hydrofera Blue to the left foot wound under the total contact cast. T the right heel patient has been o using Aquacel Ag. T the anterior leg wound antibiotic ointment. All wounds have healed. o 8/28; patient presents for follow-up. She has been using her surgical shoe with offloading felt pads. Her wounds have remained closed. 9/11; patient presents for final follow-up to assure that her wounds have remained closed. Unfortunately the left foot wound has opened again. But she has been wearing her Surgical shoe for offloading with felt pad. Electronic Signature(s) Signed: 07/05/2023 1:14:22 PM By: Geralyn Corwin DO Entered By:  Geralyn Corwin on 07/05/2023 12:12:13 -------------------------------------------------------------------------------- Physical Exam Details Patient Name: Date of Service: [name] 07/05/2023 11:00 A M Medical Record Number: [id number] Patient Account Number: [id number] Date of Birth/Sex: Treating RN: Apr 15, 1976 (47 y.o. [name] Primary Care Provider: [name] Other Clinician: [name] Referring Provider: Treating Provider/Extender: Chapman Moss in Treatment: 88 Constitutional . Cardiovascular . Psychiatric . Notes Previous right plantar foot wound remains healed. T the left plantar foot there is an open wound with callus, nonviable tissue and granulation tissue. No signs o of surrounding infection. Postdebridement Healthy Granulation tissue present. Electronic Signature(s) Signed: 07/05/2023 1:14:22 PM By: Geralyn Corwin DO Entered By: Geralyn Corwin on 07/05/2023 12:13:05 -------------------------------------------------------------------------------- Physician Orders Details Patient Name: Date of Service: [name] 07/05/2023 11:00 A M Medical Record Number: [id number] Patient Account Number: [id number] Date of Birth/Sex: Treating RN: [birth year] (47 y.o. [name] Primary Care Provider: [name] Other Clinician: Jaqulyn, Skurka A ([id number]) [id number] Page 10 of 22 Referring Provider: Treating Provider/Extender: Chapman Moss in Treatment: 41 Verbal / Phone Orders: Yes Clinician: Yevonne Pax Read Back and Verified: Yes Diagnosis Coding Follow-up Appointments Return Appointment in 2 weeks. Bathing/ Shower/ Hygiene May shower; gently cleanse wound with antibacterial soap, rinse and pat dry prior to dressing wounds No tub bath. Off-Loading Total Contact Cast to Left Lower Extremity Open toe surgical shoe - right,  left Other: - felt offloading pad to left foot Wound Treatment Wound #12R - Foot Wound Laterality: Plantar, Left, Medial Cleanser: Vashe 5.8 (oz) Discharge Instructions: Use vashe 5.8 (oz) as directed Topical: Gentamicin Discharge Instructions: Apply as directed by provider. Topical: Mupirocin Ointment Discharge Instructions: Apply as directed by provider. Prim Dressing: Hydrofera Blue Ready Transfer Foam, 2.5x2.5 (in/in) ary Discharge Instructions: Apply Hydrofera Blue Ready to wound bed as directed Secondary Dressing: ABD Pad 5x9 (in/in) Discharge Instructions: Cover with ABD pad Electronic Signature(s) Signed: 07/05/2023 1:14:22 PM By: Geralyn Corwin DO Entered By: Geralyn Corwin on 07/05/2023 12:38:41 -------------------------------------------------------------------------------- Problem List Details Patient Name: Date of Service: [name] 07/05/2023 11:00 A M Medical Record Number: [id number] Patient Account Number: [id number] Date of Birth/Sex: Treating RN: 07-28-1976 (46 y.o. [name] Primary Care Provider: [name] Other Clinician: [name] Referring Provider: Treating Provider/Extender: Chapman Moss in Treatment: 66 Active Problems ICD-10 Encounter Code Description  MICHIAH, BEDA A ([id number]) [id number] Page 1 of 22 Visit Report for 07/05/2023 Chief Complaint Document Details Patient Name: Date of Service: ANDRA, BELOUS 07/05/2023 11:00 A M Medical Record Number: [id number] Patient Account Number: [id number] Date of Birth/Sex: Treating RN: 1976-06-23 (47 y.o. [name] Primary Care Provider: [name] Other Clinician: [name] Referring Provider: Treating Provider/Extender: Chapman Moss in Treatment: 22 Information Obtained from: Patient Chief Complaint 03/19/2021; patient referred by Dr. Excell Seltzer who has been looking after her left foot for quite a period of time for review of a nonhealing area in the left midfoot 03/12/2022; bilateral feet wounds and right lower extremity wound. Electronic Signature(s) Signed: 07/05/2023 1:14:22 PM By: Geralyn Corwin DO Entered By: Geralyn Corwin on 07/05/2023 12:10:17 -------------------------------------------------------------------------------- Debridement Details Patient Name: Date of Service: [name], Silva Bandy A. 07/05/2023 11:00 A M Medical Record Number: [id number] Patient Account Number: [id number] Date of Birth/Sex: Treating RN: [birth year] (48 y.o. [name] Primary Care Provider: [name] Other Clinician: [name] Referring Provider: Treating Provider/Extender: Chapman Moss in Treatment: 68 Debridement Performed for Assessment: Wound #12R Left,Medial,Plantar Foot Performed By: Physician Geralyn Corwin, MD Debridement Type: Debridement Severity of Tissue Pre Debridement: Fat layer exposed Level of Consciousness (Pre-procedure): Awake and Alert Pre-procedure Verification/Time Out Yes - 11:41 Taken: Start Time: 11:41 Percent of Wound Bed Debrided: 100% T Area Debrided (cm): otal 2.36 Tissue and other material debrided: Viable, Non-Viable, Callus, Slough, Subcutaneous,  Slough Level: Skin/Subcutaneous Tissue Debridement Description: Excisional Instrument: Curette Bleeding: Minimum Hemostasis Achieved: Pressure Response to Treatment: Procedure was tolerated well Level of Consciousness (Post- Awake and Alert NEVAEH, POINDEXTER A ([id number]) [id number] Page 2 of 22 Awake and Alert procedure): Post Debridement Measurements of Total Wound Length: (cm) 2 Width: (cm) 1.5 Depth: (cm) 0.3 Volume: (cm) 0.707 Character of Wound/Ulcer Post Debridement: Stable Severity of Tissue Post Debridement: Fat layer exposed Post Procedure Diagnosis Same as Pre-procedure Electronic Signature(s) Signed: 07/05/2023 1:14:22 PM By: Geralyn Corwin DO Signed: 07/05/2023 4:52:51 PM By: [name] Signed: 07/07/2023 1:15:19 PM By: Yevonne Pax RN Entered By: [name] on 07/05/2023 11:51:01 -------------------------------------------------------------------------------- HPI Details Patient Name: Date of Service: [name], Karuna A. 07/05/2023 11:00 A M Medical Record Number: [id number] Patient Account Number: [id number] Date of Birth/Sex: Treating RN: 01-17-1976 (47 y.o. [name] Primary Care Provider: [name] Other Clinician: [name] Referring Provider: Treating Provider/Extender: Chapman Moss in Treatment: 1 History of Present Illness HPI Description: 01/18/18-She is here for initial evaluation of the left great toe ulcer. She is a poor historian in regards to timeframe in detail. She states approximately 4 weeks ago she lacerated her toe on something in the house. She followed up with her primary care who placed her on Bactrim and ultimately a second dose of Bactrim prior to coming to wound clinic. She states she has been treating the toe with peroxide, Betadine and a Band-Aid. She did not check her blood sugar this morning but checked it yesterday morning it was 327; she is unaware of a recent  A1c and there are no current records. She saw Dr. she would've orthopedics last week for an old injury to the left ankle, she states he did not see her toe, nor did she bring it to his attention. She smokes approximately 1 pack cigarettes a day. 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 03/16/2022 No Yes of skin W54.0XXA Bitten by dog, initial encounter 05/17/2023 No Yes S81.801A Unspecified open wound, right lower leg, initial encounter 05/17/2023 No Yes E11.622  Type 2 diabetes mellitus with other skin ulcer 05/17/2023 No Yes E11.42 Type 2 diabetes mellitus with diabetic polyneuropathy 03/16/2022 No Yes Z79.4 Long term (current) use of insulin 02/22/2023 No Yes Inactive Problems Resolved Problems Electronic Signature(s) Signed: 07/05/2023 1:14:22 PM By: Geralyn Corwin DO Entered By: Geralyn Corwin on 07/05/2023 12:10:09 -------------------------------------------------------------------------------- Progress Note Details Patient Name: Date of Service: [name] 07/05/2023 11:00 A M Medical Record Number: [id number] Patient Account Number: [id number] Date of Birth/Sex: Treating RN: May 27, 1976 (47 y.o. [name] Primary Care Provider: [name] Other Clinician: [name] Referring Provider: Treating Provider/Extender: Chapman Moss in Treatment: 29 Subjective Chief Complaint Information obtained from Patient 03/19/2021; patient referred by Dr. Excell Seltzer who has been looking after her left foot for quite a period of time for review of a nonhealing area in the left midfoot 03/12/2022; bilateral feet wounds and right lower extremity wound. History of Present Illness (HPI) 01/18/18-She is here for initial evaluation of the left great toe ulcer. She is a poor historian in regards to timeframe in detail. She states approximately 4 weeks ago she lacerated her toe on something in the house. She followed up with her primary care who placed her on Bactrim and ultimately a second dose of Bactrim prior to coming to wound clinic. She states she has been treating the toe with peroxide, Betadine and a Band-Aid. She did not check her blood sugar this morning but checked it yesterday morning it was 327; she is unaware of a recent A1c and there are no current records. She saw Dr. she would've orthopedics last week for an old injury to the left ankle, she states he did not see her toe, nor did she bring it to his attention.  She smokes approximately 1 pack cigarettes ZYLA, ZARZA A ([id number]) [id number] Page 12 of 22 a day. Her social situation is concerning, she arrives this morning with her mother who appears extremely intoxicated/under the influence; her mother was asked to leave the room and be monitored by the patient's grandmother. The patient's aunt then accompanied the patient and the room throughout the rest of the appointment. We had a lengthy discussion regarding the deleterious effects of uncontrolled hyperglycemia and smoking as it relates to wound healing and overall health. She was strongly encouraged to decrease her smoking and get her diabetes under better control. She states she is currently on a diet and has cut down her [facility] consumption. The left toe is erythematous, macerated and slightly edematous with malodor present. The edema in her left foot is below her baseline, there is no erythema streaking. We will treat her with Santyl, doxycycline; we have ordered and xray, culture and provided a Peg assist surgical shoe and cultured the wound. 01/25/18-She is here in follow-up evaluation for a left great toe ulcer and presents with an abscess to her suprapubic area. She states her blood sugars remain elevated,  MICHIAH, BEDA A ([id number]) [id number] Page 1 of 22 Visit Report for 07/05/2023 Chief Complaint Document Details Patient Name: Date of Service: ANDRA, BELOUS 07/05/2023 11:00 A M Medical Record Number: [id number] Patient Account Number: [id number] Date of Birth/Sex: Treating RN: 1976-06-23 (47 y.o. [name] Primary Care Provider: [name] Other Clinician: [name] Referring Provider: Treating Provider/Extender: Chapman Moss in Treatment: 22 Information Obtained from: Patient Chief Complaint 03/19/2021; patient referred by Dr. Excell Seltzer who has been looking after her left foot for quite a period of time for review of a nonhealing area in the left midfoot 03/12/2022; bilateral feet wounds and right lower extremity wound. Electronic Signature(s) Signed: 07/05/2023 1:14:22 PM By: Geralyn Corwin DO Entered By: Geralyn Corwin on 07/05/2023 12:10:17 -------------------------------------------------------------------------------- Debridement Details Patient Name: Date of Service: [name], Silva Bandy A. 07/05/2023 11:00 A M Medical Record Number: [id number] Patient Account Number: [id number] Date of Birth/Sex: Treating RN: [birth year] (48 y.o. [name] Primary Care Provider: [name] Other Clinician: [name] Referring Provider: Treating Provider/Extender: Chapman Moss in Treatment: 68 Debridement Performed for Assessment: Wound #12R Left,Medial,Plantar Foot Performed By: Physician Geralyn Corwin, MD Debridement Type: Debridement Severity of Tissue Pre Debridement: Fat layer exposed Level of Consciousness (Pre-procedure): Awake and Alert Pre-procedure Verification/Time Out Yes - 11:41 Taken: Start Time: 11:41 Percent of Wound Bed Debrided: 100% T Area Debrided (cm): otal 2.36 Tissue and other material debrided: Viable, Non-Viable, Callus, Slough, Subcutaneous,  Slough Level: Skin/Subcutaneous Tissue Debridement Description: Excisional Instrument: Curette Bleeding: Minimum Hemostasis Achieved: Pressure Response to Treatment: Procedure was tolerated well Level of Consciousness (Post- Awake and Alert NEVAEH, POINDEXTER A ([id number]) [id number] Page 2 of 22 Awake and Alert procedure): Post Debridement Measurements of Total Wound Length: (cm) 2 Width: (cm) 1.5 Depth: (cm) 0.3 Volume: (cm) 0.707 Character of Wound/Ulcer Post Debridement: Stable Severity of Tissue Post Debridement: Fat layer exposed Post Procedure Diagnosis Same as Pre-procedure Electronic Signature(s) Signed: 07/05/2023 1:14:22 PM By: Geralyn Corwin DO Signed: 07/05/2023 4:52:51 PM By: [name] Signed: 07/07/2023 1:15:19 PM By: Yevonne Pax RN Entered By: [name] on 07/05/2023 11:51:01 -------------------------------------------------------------------------------- HPI Details Patient Name: Date of Service: [name], Karuna A. 07/05/2023 11:00 A M Medical Record Number: [id number] Patient Account Number: [id number] Date of Birth/Sex: Treating RN: 01-17-1976 (47 y.o. [name] Primary Care Provider: [name] Other Clinician: [name] Referring Provider: Treating Provider/Extender: Chapman Moss in Treatment: 1 History of Present Illness HPI Description: 01/18/18-She is here for initial evaluation of the left great toe ulcer. She is a poor historian in regards to timeframe in detail. She states approximately 4 weeks ago she lacerated her toe on something in the house. She followed up with her primary care who placed her on Bactrim and ultimately a second dose of Bactrim prior to coming to wound clinic. She states she has been treating the toe with peroxide, Betadine and a Band-Aid. She did not check her blood sugar this morning but checked it yesterday morning it was 327; she is unaware of a recent  A1c and there are no current records. She saw Dr. she would've orthopedics last week for an old injury to the left ankle, she states he did not see her toe, nor did she bring it to his attention. She smokes approximately 1 pack cigarettes a day. Her social situation is concerning, she arrives this morning with her mother who appears extremely intoxicated/under the influence; her mother was asked to leave the room  MICHIAH, BEDA A ([id number]) [id number] Page 1 of 22 Visit Report for 07/05/2023 Chief Complaint Document Details Patient Name: Date of Service: ANDRA, BELOUS 07/05/2023 11:00 A M Medical Record Number: [id number] Patient Account Number: [id number] Date of Birth/Sex: Treating RN: 1976-06-23 (47 y.o. [name] Primary Care Provider: [name] Other Clinician: [name] Referring Provider: Treating Provider/Extender: Chapman Moss in Treatment: 22 Information Obtained from: Patient Chief Complaint 03/19/2021; patient referred by Dr. Excell Seltzer who has been looking after her left foot for quite a period of time for review of a nonhealing area in the left midfoot 03/12/2022; bilateral feet wounds and right lower extremity wound. Electronic Signature(s) Signed: 07/05/2023 1:14:22 PM By: Geralyn Corwin DO Entered By: Geralyn Corwin on 07/05/2023 12:10:17 -------------------------------------------------------------------------------- Debridement Details Patient Name: Date of Service: [name], Silva Bandy A. 07/05/2023 11:00 A M Medical Record Number: [id number] Patient Account Number: [id number] Date of Birth/Sex: Treating RN: [birth year] (48 y.o. [name] Primary Care Provider: [name] Other Clinician: [name] Referring Provider: Treating Provider/Extender: Chapman Moss in Treatment: 68 Debridement Performed for Assessment: Wound #12R Left,Medial,Plantar Foot Performed By: Physician Geralyn Corwin, MD Debridement Type: Debridement Severity of Tissue Pre Debridement: Fat layer exposed Level of Consciousness (Pre-procedure): Awake and Alert Pre-procedure Verification/Time Out Yes - 11:41 Taken: Start Time: 11:41 Percent of Wound Bed Debrided: 100% T Area Debrided (cm): otal 2.36 Tissue and other material debrided: Viable, Non-Viable, Callus, Slough, Subcutaneous,  Slough Level: Skin/Subcutaneous Tissue Debridement Description: Excisional Instrument: Curette Bleeding: Minimum Hemostasis Achieved: Pressure Response to Treatment: Procedure was tolerated well Level of Consciousness (Post- Awake and Alert NEVAEH, POINDEXTER A ([id number]) [id number] Page 2 of 22 Awake and Alert procedure): Post Debridement Measurements of Total Wound Length: (cm) 2 Width: (cm) 1.5 Depth: (cm) 0.3 Volume: (cm) 0.707 Character of Wound/Ulcer Post Debridement: Stable Severity of Tissue Post Debridement: Fat layer exposed Post Procedure Diagnosis Same as Pre-procedure Electronic Signature(s) Signed: 07/05/2023 1:14:22 PM By: Geralyn Corwin DO Signed: 07/05/2023 4:52:51 PM By: [name] Signed: 07/07/2023 1:15:19 PM By: Yevonne Pax RN Entered By: [name] on 07/05/2023 11:51:01 -------------------------------------------------------------------------------- HPI Details Patient Name: Date of Service: [name], Karuna A. 07/05/2023 11:00 A M Medical Record Number: [id number] Patient Account Number: [id number] Date of Birth/Sex: Treating RN: 01-17-1976 (47 y.o. [name] Primary Care Provider: [name] Other Clinician: [name] Referring Provider: Treating Provider/Extender: Chapman Moss in Treatment: 1 History of Present Illness HPI Description: 01/18/18-She is here for initial evaluation of the left great toe ulcer. She is a poor historian in regards to timeframe in detail. She states approximately 4 weeks ago she lacerated her toe on something in the house. She followed up with her primary care who placed her on Bactrim and ultimately a second dose of Bactrim prior to coming to wound clinic. She states she has been treating the toe with peroxide, Betadine and a Band-Aid. She did not check her blood sugar this morning but checked it yesterday morning it was 327; she is unaware of a recent  A1c and there are no current records. She saw Dr. she would've orthopedics last week for an old injury to the left ankle, she states he did not see her toe, nor did she bring it to his attention. She smokes approximately 1 pack cigarettes a day. 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 03/16/2022 No Yes of skin W54.0XXA Bitten by dog, initial encounter 05/17/2023 No Yes S81.801A Unspecified open wound, right lower leg, initial encounter 05/17/2023 No Yes E11.622  Type 2 diabetes mellitus with other skin ulcer 05/17/2023 No Yes E11.42 Type 2 diabetes mellitus with diabetic polyneuropathy 03/16/2022 No Yes Z79.4 Long term (current) use of insulin 02/22/2023 No Yes Inactive Problems Resolved Problems Electronic Signature(s) Signed: 07/05/2023 1:14:22 PM By: Geralyn Corwin DO Entered By: Geralyn Corwin on 07/05/2023 12:10:09 -------------------------------------------------------------------------------- Progress Note Details Patient Name: Date of Service: [name] 07/05/2023 11:00 A M Medical Record Number: [id number] Patient Account Number: [id number] Date of Birth/Sex: Treating RN: May 27, 1976 (47 y.o. [name] Primary Care Provider: [name] Other Clinician: [name] Referring Provider: Treating Provider/Extender: Chapman Moss in Treatment: 29 Subjective Chief Complaint Information obtained from Patient 03/19/2021; patient referred by Dr. Excell Seltzer who has been looking after her left foot for quite a period of time for review of a nonhealing area in the left midfoot 03/12/2022; bilateral feet wounds and right lower extremity wound. History of Present Illness (HPI) 01/18/18-She is here for initial evaluation of the left great toe ulcer. She is a poor historian in regards to timeframe in detail. She states approximately 4 weeks ago she lacerated her toe on something in the house. She followed up with her primary care who placed her on Bactrim and ultimately a second dose of Bactrim prior to coming to wound clinic. She states she has been treating the toe with peroxide, Betadine and a Band-Aid. She did not check her blood sugar this morning but checked it yesterday morning it was 327; she is unaware of a recent A1c and there are no current records. She saw Dr. she would've orthopedics last week for an old injury to the left ankle, she states he did not see her toe, nor did she bring it to his attention.  She smokes approximately 1 pack cigarettes ZYLA, ZARZA A ([id number]) [id number] Page 12 of 22 a day. Her social situation is concerning, she arrives this morning with her mother who appears extremely intoxicated/under the influence; her mother was asked to leave the room and be monitored by the patient's grandmother. The patient's aunt then accompanied the patient and the room throughout the rest of the appointment. We had a lengthy discussion regarding the deleterious effects of uncontrolled hyperglycemia and smoking as it relates to wound healing and overall health. She was strongly encouraged to decrease her smoking and get her diabetes under better control. She states she is currently on a diet and has cut down her [facility] consumption. The left toe is erythematous, macerated and slightly edematous with malodor present. The edema in her left foot is below her baseline, there is no erythema streaking. We will treat her with Santyl, doxycycline; we have ordered and xray, culture and provided a Peg assist surgical shoe and cultured the wound. 01/25/18-She is here in follow-up evaluation for a left great toe ulcer and presents with an abscess to her suprapubic area. She states her blood sugars remain elevated,

## 2023-07-07 NOTE — Progress Notes (Signed)
CARMIE, DEHART [name] ([id number]) [id number] Page 1 of 8 Visit Report for 07/05/2023 Arrival Information Details Patient Name: Date of Service: [name], [name] 07/05/2023 11:00 [name]: [id number] Patient Account Number: [id number] Date of Birth/Sex: Treating RN: May 05, 1976 (47 y.o. [name] Primary Care Keairra Bardon: [name] Other Clinician: [name] Referring Anabeth Chilcott: Treating Mayeli Bornhorst/Extender: Chapman Moss in Treatment: 69 Visit Information History Since Last Visit All ordered tests and consults were completed: No Patient Arrived: Ambulatory Added or deleted any medications: No Arrival Time: 11:06 Any new allergies or adverse reactions: No Transfer Assistance: None Had [name] fall or experienced change in No Patient Identification Verified: Yes activities of daily living that may affect Secondary Verification Process Completed: Yes risk of falls: Patient Requires Transmission-Based Precautions: No Signs or symptoms of abuse/neglect since No Patient Has Alerts: No last visito Hospitalized since last visit: No Implantable device outside of the clinic No excluding cellular tissue based products placed in the center since last visit: Has Dressing in Place as Prescribed: Yes Has Footwear/Offloading in Place as Yes Prescribed: Left: Surgical Shoe with Pressure Relief Insole Right: Wedge Shoe Pain Present Now: No Electronic Signature(s) Signed: 07/05/2023 4:52:51 PM By: [name] Entered By: [name] on 07/05/2023 08:11:10 -------------------------------------------------------------------------------- Clinic Level of Care Assessment Details Patient Name: Date of Service: [name], [name] 07/05/2023 11:00 [name]: [id number] Patient Account Number: [id number] Date of Birth/Sex: Treating RN: Jun 28, 1976 (47 y.o. [name] Primary Care Summers Buendia: [name] Other Clinician: [name] Referring Nabria Nevin: Treating Walaa Carel/Extender: Chapman Moss in Treatment: 55 Clinic Level of Care Assessment Items [name], [name] ([id number]) [id number] Page 2 of 8 TOOL 1 Quantity Score []  - 0 Use when EandM and Procedure is performed on INITIAL visit ASSESSMENTS - Nursing Assessment / Reassessment []  - 0 General Physical Exam (combine w/ comprehensive assessment (listed just below) when performed on new pt. evals) []  - 0 Comprehensive Assessment (HX, ROS, Risk Assessments, Wounds Hx, etc.) ASSESSMENTS - Wound and Skin Assessment / Reassessment []  - 0 Dermatologic / Skin Assessment (not related to wound area) ASSESSMENTS - Ostomy and/or Continence Assessment and Care []  - 0 Incontinence Assessment and Management []  - 0 Ostomy Care Assessment and Management (repouching, etc.) PROCESS - Coordination of Care []  - 0 Simple Patient / Family Education for ongoing care []  - 0 Complex (extensive) Patient / Family Education for ongoing care []  - 0 Staff obtains Chiropractor, Records, T Results / Process Orders est []  - 0 Staff telephones HHA, Nursing Homes / Clarify orders / etc []  - 0 Routine Transfer to another Facility (non-emergent condition) []  - 0 Routine Hospital Admission (non-emergent condition) []  - 0 New Admissions / Manufacturing engineer / Ordering NPWT Apligraf, etc. , []  - 0 Emergency Hospital Admission (emergent condition) PROCESS - Special Needs []  - 0 Pediatric / Minor Patient Management []  - 0 Isolation Patient Management []  - 0 Hearing / Language / Visual special needs []  - 0 Assessment of Community assistance (transportation, D/C planning, etc.) []  - 0 Additional assistance / Altered mentation []  - 0 Support Surface(s) Assessment (bed, cushion, seat, etc.) INTERVENTIONS - Miscellaneous []  - 0 External ear exam []  - 0 Patient Transfer (multiple staff /  Nurse, adult / Similar devices) []  - 0 Simple Staple / Suture removal (25 or less) []  - 0 Complex Staple / Suture removal (26 or more) []  - 0 Hypo/Hyperglycemic Management (do not check if billed separately) []  -  Reduction in Volume: 96.5% Epithelialization: Large (67-100%) 1 33 Wound  Description Classification: Grade 3 Wound Margin: Flat and Intact Exudate Amount: Medium Exudate Type: Serosanguineous Exudate Color: red, brown Foul Odor After Cleansing: No Slough/Fibrino Yes Wound Bed Granulation Amount: None Present (0%) Exposed Structure Necrotic Amount: None Present (0%) Fascia Exposed: No Fat Layer (Subcutaneous Tissue) Exposed: Yes Tendon Exposed: No Muscle Exposed: No Joint Exposed: No Bone Exposed: No Treatment Notes Wound #12R (Foot) Wound Laterality: Plantar, Left, Medial [name], [name] ([id number]) [id number] Page 8 of 8 Cleanser Vashe 5.8 (oz) Discharge Instruction: Use vashe 5.8 (oz) as directed Peri-Wound Care Topical Gentamicin Discharge Instruction: Apply as directed by Santana Edell. Mupirocin Ointment Discharge Instruction: Apply as directed by Sebastain Fishbaugh. Primary Dressing Hydrofera Blue Ready Transfer Foam, 2.5x2.5 (in/in) Discharge Instruction: Apply Hydrofera Blue Ready to wound bed as directed Secondary Dressing ABD Pad 5x9 (in/in) Discharge Instruction: Cover with ABD pad Secured With Compression Wrap Compression Stockings Add-Ons Electronic Signature(s) Signed: 07/05/2023 4:52:51 PM By: [name] Signed: 07/07/2023 1:15:19 PM By: Yevonne Pax RN Entered By: [name] on 07/05/2023 08:28:07 -------------------------------------------------------------------------------- Vitals Details Patient Name: Date of Service: [name], [name] 07/05/2023 11:00 [name]: [id number] Patient Account Number: [id number] Date of Birth/Sex: Treating RN: [birth year] (47 y.o. [name] Primary Care Samie Reasons: [name] Other Clinician: [name] Referring Costella Schwarz: Treating Yariah Selvey/Extender: Chapman Moss in Treatment: 67 Vital Signs Time Taken: 11:11 Temperature (F): 99.4 Height (in): 69 Pulse (bpm): 128 Weight (lbs): 178 Respiratory Rate  (breaths/min): 18 Body Mass Index (BMI): 26.3 Blood Pressure (mmHg): 142/80 Reference Range: 80 - 120 mg / dl Electronic Signature(s) Signed: 07/05/2023 4:52:51 PM By: [name] Entered By: [name] on 07/05/2023 08:14:31  CARMIE, DEHART [name] ([id number]) [id number] Page 1 of 8 Visit Report for 07/05/2023 Arrival Information Details Patient Name: Date of Service: [name], [name] 07/05/2023 11:00 [name]: [id number] Patient Account Number: [id number] Date of Birth/Sex: Treating RN: May 05, 1976 (47 y.o. [name] Primary Care Keairra Bardon: [name] Other Clinician: [name] Referring Anabeth Chilcott: Treating Mayeli Bornhorst/Extender: Chapman Moss in Treatment: 69 Visit Information History Since Last Visit All ordered tests and consults were completed: No Patient Arrived: Ambulatory Added or deleted any medications: No Arrival Time: 11:06 Any new allergies or adverse reactions: No Transfer Assistance: None Had [name] fall or experienced change in No Patient Identification Verified: Yes activities of daily living that may affect Secondary Verification Process Completed: Yes risk of falls: Patient Requires Transmission-Based Precautions: No Signs or symptoms of abuse/neglect since No Patient Has Alerts: No last visito Hospitalized since last visit: No Implantable device outside of the clinic No excluding cellular tissue based products placed in the center since last visit: Has Dressing in Place as Prescribed: Yes Has Footwear/Offloading in Place as Yes Prescribed: Left: Surgical Shoe with Pressure Relief Insole Right: Wedge Shoe Pain Present Now: No Electronic Signature(s) Signed: 07/05/2023 4:52:51 PM By: [name] Entered By: [name] on 07/05/2023 08:11:10 -------------------------------------------------------------------------------- Clinic Level of Care Assessment Details Patient Name: Date of Service: [name], [name] 07/05/2023 11:00 [name]: [id number] Patient Account Number: [id number] Date of Birth/Sex: Treating RN: Jun 28, 1976 (47 y.o. [name] Primary Care Summers Buendia: [name] Other Clinician: [name] Referring Nabria Nevin: Treating Walaa Carel/Extender: Chapman Moss in Treatment: 55 Clinic Level of Care Assessment Items [name], [name] ([id number]) [id number] Page 2 of 8 TOOL 1 Quantity Score []  - 0 Use when EandM and Procedure is performed on INITIAL visit ASSESSMENTS - Nursing Assessment / Reassessment []  - 0 General Physical Exam (combine w/ comprehensive assessment (listed just below) when performed on new pt. evals) []  - 0 Comprehensive Assessment (HX, ROS, Risk Assessments, Wounds Hx, etc.) ASSESSMENTS - Wound and Skin Assessment / Reassessment []  - 0 Dermatologic / Skin Assessment (not related to wound area) ASSESSMENTS - Ostomy and/or Continence Assessment and Care []  - 0 Incontinence Assessment and Management []  - 0 Ostomy Care Assessment and Management (repouching, etc.) PROCESS - Coordination of Care []  - 0 Simple Patient / Family Education for ongoing care []  - 0 Complex (extensive) Patient / Family Education for ongoing care []  - 0 Staff obtains Chiropractor, Records, T Results / Process Orders est []  - 0 Staff telephones HHA, Nursing Homes / Clarify orders / etc []  - 0 Routine Transfer to another Facility (non-emergent condition) []  - 0 Routine Hospital Admission (non-emergent condition) []  - 0 New Admissions / Manufacturing engineer / Ordering NPWT Apligraf, etc. , []  - 0 Emergency Hospital Admission (emergent condition) PROCESS - Special Needs []  - 0 Pediatric / Minor Patient Management []  - 0 Isolation Patient Management []  - 0 Hearing / Language / Visual special needs []  - 0 Assessment of Community assistance (transportation, D/C planning, etc.) []  - 0 Additional assistance / Altered mentation []  - 0 Support Surface(s) Assessment (bed, cushion, seat, etc.) INTERVENTIONS - Miscellaneous []  - 0 External ear exam []  - 0 Patient Transfer (multiple staff /  Nurse, adult / Similar devices) []  - 0 Simple Staple / Suture removal (25 or less) []  - 0 Complex Staple / Suture removal (26 or more) []  - 0 Hypo/Hyperglycemic Management (do not check if billed separately) []  -  Reduction in Volume: 96.5% Epithelialization: Large (67-100%) 1 33 Wound  Description Classification: Grade 3 Wound Margin: Flat and Intact Exudate Amount: Medium Exudate Type: Serosanguineous Exudate Color: red, brown Foul Odor After Cleansing: No Slough/Fibrino Yes Wound Bed Granulation Amount: None Present (0%) Exposed Structure Necrotic Amount: None Present (0%) Fascia Exposed: No Fat Layer (Subcutaneous Tissue) Exposed: Yes Tendon Exposed: No Muscle Exposed: No Joint Exposed: No Bone Exposed: No Treatment Notes Wound #12R (Foot) Wound Laterality: Plantar, Left, Medial [name], [name] ([id number]) [id number] Page 8 of 8 Cleanser Vashe 5.8 (oz) Discharge Instruction: Use vashe 5.8 (oz) as directed Peri-Wound Care Topical Gentamicin Discharge Instruction: Apply as directed by Santana Edell. Mupirocin Ointment Discharge Instruction: Apply as directed by Sebastain Fishbaugh. Primary Dressing Hydrofera Blue Ready Transfer Foam, 2.5x2.5 (in/in) Discharge Instruction: Apply Hydrofera Blue Ready to wound bed as directed Secondary Dressing ABD Pad 5x9 (in/in) Discharge Instruction: Cover with ABD pad Secured With Compression Wrap Compression Stockings Add-Ons Electronic Signature(s) Signed: 07/05/2023 4:52:51 PM By: [name] Signed: 07/07/2023 1:15:19 PM By: Yevonne Pax RN Entered By: [name] on 07/05/2023 08:28:07 -------------------------------------------------------------------------------- Vitals Details Patient Name: Date of Service: [name], [name] 07/05/2023 11:00 [name]: [id number] Patient Account Number: [id number] Date of Birth/Sex: Treating RN: [birth year] (47 y.o. [name] Primary Care Samie Reasons: [name] Other Clinician: [name] Referring Costella Schwarz: Treating Yariah Selvey/Extender: Chapman Moss in Treatment: 67 Vital Signs Time Taken: 11:11 Temperature (F): 99.4 Height (in): 69 Pulse (bpm): 128 Weight (lbs): 178 Respiratory Rate  (breaths/min): 18 Body Mass Index (BMI): 26.3 Blood Pressure (mmHg): 142/80 Reference Range: 80 - 120 mg / dl Electronic Signature(s) Signed: 07/05/2023 4:52:51 PM By: [name] Entered By: [name] on 07/05/2023 08:14:31

## 2023-07-13 ENCOUNTER — Ambulatory Visit: Payer: MEDICAID | Admitting: Physician Assistant

## 2023-07-14 ENCOUNTER — Encounter: Payer: MEDICAID | Admitting: Physician Assistant

## 2023-07-14 DIAGNOSIS — E11622 Type 2 diabetes mellitus with other skin ulcer: Secondary | ICD-10-CM | POA: Diagnosis not present

## 2023-07-14 NOTE — Progress Notes (Addendum)
oz) as directed ALISON, ANTRIM A ([id number]) [phone].pdf Page 8 of 8 Heather-Wound  Care Topical Gentamicin Discharge Instruction: Apply as directed by Markan Cazarez. Primary Dressing Hydrofera Blue Ready Transfer Foam, 2.5x2.5 (in/in) Discharge Instruction: Apply Hydrofera Blue Ready to wound bed as directed Secondary Dressing ABD Pad 5x9 (in/in) Discharge Instruction: Cover with ABD pad Secured With Compression Wrap Compression Stockings Add-Ons Electronic Signature(s) Signed: 07/14/2023 12:10:54 PM By: Angelina Pih Signed: 07/17/2023 5:11:42 PM By: [name] Entered By: [name] on 07/14/2023 06:00:31 -------------------------------------------------------------------------------- Vitals Details Patient Name: Date of Service: [name], [name] 07/14/2023 8:30 A M Medical Record Number: [id number] Patient Account Number: [id number] Date of Birth/Sex: Treating RN: 1975-12-24 (47 y.o. Esmeralda Links Primary Care Wylma Tatem: [name] Other Clinician: [name] Referring Debrah Granderson: Treating Finis Hendricksen/Extender: [name] Weeks in Treatment: 69 Vital Signs Time Taken: 08:47 Temperature (F): 98.5 Height (in): 69 Pulse (bpm): 103 Weight (lbs): 178 Respiratory Rate (breaths/min): 16 Body Mass Index (BMI): 26.3 Blood Pressure (mmHg): 128/98 Reference Range: 80 - 120 mg / dl Electronic Signature(s) Signed: 07/17/2023 5:11:42 PM By: [name] Entered By: [name] on 07/14/2023 05:50:50  Brachial Index (ABI) - do not check if billed separately Has the patient been seen at the hospital within the last three years: Yes Total Score: 0 Level Of Care: ____ Electronic Signature(s) Signed: 07/17/2023 5:11:42 PM By: [name] Entered By: [name] on 07/14/2023 06:17:10 -------------------------------------------------------------------------------- Encounter Discharge Information Details Patient Name: Date of Service: Heather Jefferson A. 07/14/2023 8:30 A M Medical Record Number: [id number] Patient Account Number: [id number] Date of Birth/Sex: Treating RN: 1976-09-28 (47 y.o. Esmeralda Links [place], Atheena A ([id number]) [id number] Page 3 of 8 Primary Care Lorilynn Lehr: [name] Other Clinician: [name] Referring Rayquan Amrhein: Treating Magdeline Prange/Extender: [name] in Treatment: 57 Encounter Discharge Information Items Post Procedure Vitals Discharge Condition: Stable Temperature (F): 98.5 Ambulatory Status: Wheelchair Pulse (bpm): 103 Discharge Destination: Home Respiratory Rate (breaths/min): 16 Transportation: Private Auto Blood Pressure (mmHg): 128/98 Accompanied By: mother Schedule Follow-up Appointment: Yes Clinical Summary of Care: Electronic Signature(s) Signed: 07/17/2023 5:11:42 PM By: [name] Entered By: [name] on 07/14/2023 09:26:45 -------------------------------------------------------------------------------- Lower Extremity Assessment Details Patient Name: Date of Service: [name] 07/14/2023 8:30 A M Medical Record Number: [id number] Patient Account Number: [id number] Date of Birth/Sex: Treating RN: [birth year] (47 y.o. Esmeralda Links Primary Care Hrithik Boschee: [name] Other Clinician:  [name] Referring Vin Yonke: Treating Aseret Hoffman/Extender: [name] Weeks in Treatment: 62 Electronic Signature(s) Signed: 07/14/2023 12:10:54 PM By: Angelina Pih Signed: 07/17/2023 5:11:42 PM By: [name] Entered By: [name] on 07/14/2023 06:00:43 -------------------------------------------------------------------------------- Multi Wound Chart Details Patient Name: Date of Service: Heather Jefferson A. 07/14/2023 8:30 A M Medical Record Number: [id number] Patient Account Number: [id number] Date of Birth/Sex: Treating RN: [birth year] (47 y.o. Esmeralda Links Primary Care Zahra Peffley: [name] Other Clinician: [name] Referring Remmie Bembenek: Treating Marget Outten/Extender: [name] Weeks in Treatment: 30 Vital Signs Height(in): 69 Pulse(bpm): 103 Weight(lbs): 178 Blood Pressure(mmHg): 128/98 Body Mass Index(BMI): 26.3 Temperature(F): 98.5 Respiratory Rate(breaths/min): 16 [name], Heather A ([id number]) [phone].pdf Page 4 of 8 [12R:Photos:] [N/A:N/A] Left, Medial, Plantar Foot N/A N/A Wound Location: Pressure Injury N/A N/A Wounding Event: Diabetic Wound/Ulcer of the Lower N/A N/A Primary Etiology: Extremity Chronic sinus problems/congestion, N/A N/A Comorbid History: Middle ear problems, Anemia, Chronic Obstructive Pulmonary Disease (COPD), Congestive Heart Failure, Type II Diabetes, End Stage Renal Disease, History of pressure wounds, Neuropathy 03/16/2020 N/A N/A Date Acquired: 17 N/A N/A Weeks of Treatment: Open N/A N/A Wound Status: Yes N/A N/A Wound Recurrence: 0.4x0.5x0.2 N/A N/A Measurements L x W x D (cm) 0.157 N/A N/A A (cm) : rea 0.031 N/A N/A Volume (cm) : 96.70% N/A N/A % Reduction in A rea: 96.70% N/A N/A % Reduction in Volume: Grade 3 N/A N/A Classification: Medium N/A N/A Exudate A mount: Serosanguineous N/A N/A Exudate Type: red, brown N/A N/A Exudate  Color: Flat and Intact N/A N/A Wound Margin: None Present (0%) N/A N/A Granulation A mount: None Present (0%) N/A N/A Necrotic A mount: Fat Layer (Subcutaneous Tissue): Yes N/A N/A Exposed Structures: Fascia: No Tendon: No Muscle: No Joint: No Bone: No Large (67-100%) N/A N/A Epithelialization: Treatment Notes Electronic Signature(s) Signed: 07/17/2023 5:11:42 PM By: [name] Entered By: [name] on 07/14/2023 06:00:48 -------------------------------------------------------------------------------- Multi-Disciplinary Care Plan Details Patient Name: Date of Service: Heather Jefferson A. 07/14/2023 8:30 A M Medical Record Number: [id number] Patient Account Number: [id number] Date of Birth/Sex: Treating RN: 02-27-1976 (47 y.o. Esmeralda Links Primary Care Relena Ivancic: [name] Other Clinician: [name] Referring Emy Angevine: Treating Daune Colgate/Extender: Norman Clay,  Brachial Index (ABI) - do not check if billed separately Has the patient been seen at the hospital within the last three years: Yes Total Score: 0 Level Of Care: ____ Electronic Signature(s) Signed: 07/17/2023 5:11:42 PM By: [name] Entered By: [name] on 07/14/2023 06:17:10 -------------------------------------------------------------------------------- Encounter Discharge Information Details Patient Name: Date of Service: Heather Jefferson A. 07/14/2023 8:30 A M Medical Record Number: [id number] Patient Account Number: [id number] Date of Birth/Sex: Treating RN: 1976-09-28 (47 y.o. Esmeralda Links [place], Atheena A ([id number]) [id number] Page 3 of 8 Primary Care Lorilynn Lehr: [name] Other Clinician: [name] Referring Rayquan Amrhein: Treating Magdeline Prange/Extender: [name] in Treatment: 57 Encounter Discharge Information Items Post Procedure Vitals Discharge Condition: Stable Temperature (F): 98.5 Ambulatory Status: Wheelchair Pulse (bpm): 103 Discharge Destination: Home Respiratory Rate (breaths/min): 16 Transportation: Private Auto Blood Pressure (mmHg): 128/98 Accompanied By: mother Schedule Follow-up Appointment: Yes Clinical Summary of Care: Electronic Signature(s) Signed: 07/17/2023 5:11:42 PM By: [name] Entered By: [name] on 07/14/2023 09:26:45 -------------------------------------------------------------------------------- Lower Extremity Assessment Details Patient Name: Date of Service: [name] 07/14/2023 8:30 A M Medical Record Number: [id number] Patient Account Number: [id number] Date of Birth/Sex: Treating RN: [birth year] (47 y.o. Esmeralda Links Primary Care Hrithik Boschee: [name] Other Clinician:  [name] Referring Vin Yonke: Treating Aseret Hoffman/Extender: [name] Weeks in Treatment: 62 Electronic Signature(s) Signed: 07/14/2023 12:10:54 PM By: Angelina Pih Signed: 07/17/2023 5:11:42 PM By: [name] Entered By: [name] on 07/14/2023 06:00:43 -------------------------------------------------------------------------------- Multi Wound Chart Details Patient Name: Date of Service: Heather Jefferson A. 07/14/2023 8:30 A M Medical Record Number: [id number] Patient Account Number: [id number] Date of Birth/Sex: Treating RN: [birth year] (47 y.o. Esmeralda Links Primary Care Zahra Peffley: [name] Other Clinician: [name] Referring Remmie Bembenek: Treating Marget Outten/Extender: [name] Weeks in Treatment: 30 Vital Signs Height(in): 69 Pulse(bpm): 103 Weight(lbs): 178 Blood Pressure(mmHg): 128/98 Body Mass Index(BMI): 26.3 Temperature(F): 98.5 Respiratory Rate(breaths/min): 16 [name], Heather A ([id number]) [phone].pdf Page 4 of 8 [12R:Photos:] [N/A:N/A] Left, Medial, Plantar Foot N/A N/A Wound Location: Pressure Injury N/A N/A Wounding Event: Diabetic Wound/Ulcer of the Lower N/A N/A Primary Etiology: Extremity Chronic sinus problems/congestion, N/A N/A Comorbid History: Middle ear problems, Anemia, Chronic Obstructive Pulmonary Disease (COPD), Congestive Heart Failure, Type II Diabetes, End Stage Renal Disease, History of pressure wounds, Neuropathy 03/16/2020 N/A N/A Date Acquired: 17 N/A N/A Weeks of Treatment: Open N/A N/A Wound Status: Yes N/A N/A Wound Recurrence: 0.4x0.5x0.2 N/A N/A Measurements L x W x D (cm) 0.157 N/A N/A A (cm) : rea 0.031 N/A N/A Volume (cm) : 96.70% N/A N/A % Reduction in A rea: 96.70% N/A N/A % Reduction in Volume: Grade 3 N/A N/A Classification: Medium N/A N/A Exudate A mount: Serosanguineous N/A N/A Exudate Type: red, brown N/A N/A Exudate  Color: Flat and Intact N/A N/A Wound Margin: None Present (0%) N/A N/A Granulation A mount: None Present (0%) N/A N/A Necrotic A mount: Fat Layer (Subcutaneous Tissue): Yes N/A N/A Exposed Structures: Fascia: No Tendon: No Muscle: No Joint: No Bone: No Large (67-100%) N/A N/A Epithelialization: Treatment Notes Electronic Signature(s) Signed: 07/17/2023 5:11:42 PM By: [name] Entered By: [name] on 07/14/2023 06:00:48 -------------------------------------------------------------------------------- Multi-Disciplinary Care Plan Details Patient Name: Date of Service: Heather Jefferson A. 07/14/2023 8:30 A M Medical Record Number: [id number] Patient Account Number: [id number] Date of Birth/Sex: Treating RN: 02-27-1976 (47 y.o. Esmeralda Links Primary Care Relena Ivancic: [name] Other Clinician: [name] Referring Emy Angevine: Treating Daune Colgate/Extender: Norman Clay,  Brachial Index (ABI) - do not check if billed separately Has the patient been seen at the hospital within the last three years: Yes Total Score: 0 Level Of Care: ____ Electronic Signature(s) Signed: 07/17/2023 5:11:42 PM By: [name] Entered By: [name] on 07/14/2023 06:17:10 -------------------------------------------------------------------------------- Encounter Discharge Information Details Patient Name: Date of Service: Heather Jefferson A. 07/14/2023 8:30 A M Medical Record Number: [id number] Patient Account Number: [id number] Date of Birth/Sex: Treating RN: 1976-09-28 (47 y.o. Esmeralda Links [place], Atheena A ([id number]) [id number].pdf Page 3 of 8 Primary Care Lorilynn Lehr: [name] Other Clinician: [name] Referring Rayquan Amrhein: Treating Magdeline Prange/Extender: [name] in Treatment: 57 Encounter Discharge Information Items Post Procedure Vitals Discharge Condition: Stable Temperature (F): 98.5 Ambulatory Status: Wheelchair Pulse (bpm): 103 Discharge Destination: Home Respiratory Rate (breaths/min): 16 Transportation: Private Auto Blood Pressure (mmHg): 128/98 Accompanied By: mother Schedule Follow-up Appointment: Yes Clinical Summary of Care: Electronic Signature(s) Signed: 07/17/2023 5:11:42 PM By: [name] Entered By: [name] on 07/14/2023 09:26:45 -------------------------------------------------------------------------------- Lower Extremity Assessment Details Patient Name: Date of Service: [name] 07/14/2023 8:30 A M Medical Record Number: [id number] Patient Account Number: [id number] Date of Birth/Sex: Treating RN: [birth year] (47 y.o. Esmeralda Links Primary Care Hrithik Boschee: [name] Other Clinician:  [name] Referring Vin Yonke: Treating Aseret Hoffman/Extender: [name] Weeks in Treatment: 62 Electronic Signature(s) Signed: 07/14/2023 12:10:54 PM By: Angelina Pih Signed: 07/17/2023 5:11:42 PM By: [name] Entered By: [name] on 07/14/2023 06:00:43 -------------------------------------------------------------------------------- Multi Wound Chart Details Patient Name: Date of Service: Heather Jefferson A. 07/14/2023 8:30 A M Medical Record Number: [id number] Patient Account Number: [id number] Date of Birth/Sex: Treating RN: [birth year] (47 y.o. Esmeralda Links Primary Care Zahra Peffley: [name] Other Clinician: [name] Referring Remmie Bembenek: Treating Marget Outten/Extender: [name] Weeks in Treatment: 30 Vital Signs Height(in): 69 Pulse(bpm): 103 Weight(lbs): 178 Blood Pressure(mmHg): 128/98 Body Mass Index(BMI): 26.3 Temperature(F): 98.5 Respiratory Rate(breaths/min): 16 [name], Heather A ([id number]) [phone].pdf Page 4 of 8 [12R:Photos:] [N/A:N/A] Left, Medial, Plantar Foot N/A N/A Wound Location: Pressure Injury N/A N/A Wounding Event: Diabetic Wound/Ulcer of the Lower N/A N/A Primary Etiology: Extremity Chronic sinus problems/congestion, N/A N/A Comorbid History: Middle ear problems, Anemia, Chronic Obstructive Pulmonary Disease (COPD), Congestive Heart Failure, Type II Diabetes, End Stage Renal Disease, History of pressure wounds, Neuropathy 03/16/2020 N/A N/A Date Acquired: 17 N/A N/A Weeks of Treatment: Open N/A N/A Wound Status: Yes N/A N/A Wound Recurrence: 0.4x0.5x0.2 N/A N/A Measurements L x W x D (cm) 0.157 N/A N/A A (cm) : rea 0.031 N/A N/A Volume (cm) : 96.70% N/A N/A % Reduction in A rea: 96.70% N/A N/A % Reduction in Volume: Grade 3 N/A N/A Classification: Medium N/A N/A Exudate A mount: Serosanguineous N/A N/A Exudate Type: red, brown N/A N/A Exudate  Color: Flat and Intact N/A N/A Wound Margin: None Present (0%) N/A N/A Granulation A mount: None Present (0%) N/A N/A Necrotic A mount: Fat Layer (Subcutaneous Tissue): Yes N/A N/A Exposed Structures: Fascia: No Tendon: No Muscle: No Joint: No Bone: No Large (67-100%) N/A N/A Epithelialization: Treatment Notes Electronic Signature(s) Signed: 07/17/2023 5:11:42 PM By: [name] Entered By: [name] on 07/14/2023 06:00:48 -------------------------------------------------------------------------------- Multi-Disciplinary Care Plan Details Patient Name: Date of Service: Heather Jefferson A. 07/14/2023 8:30 A M Medical Record Number: [id number] Patient Account Number: [id number] Date of Birth/Sex: Treating RN: 02-27-1976 (47 y.o. Esmeralda Links Primary Care Relena Ivancic: [name] Other Clinician: [name] Referring Emy Angevine: Treating Daune Colgate/Extender: Norman Clay,

## 2023-07-14 NOTE — Progress Notes (Addendum)
MARCHELLA, HIBBARD A ([id number]) [id number] Page 1 of 20 Visit Report for 07/14/2023 Chief Complaint Document Details Patient Name: Date of Service: KRISTIN, BARCUS 07/14/2023 8:30 A M Medical Record Number: [id number] Patient Account Number: [id number] Date of Birth/Sex: Treating RN: Jun 16, 1976 (47 y.o. Esmeralda Links Primary Care Provider: [name] Other Clinician: [name] Referring Provider: Treating Provider/Extender: [name] Weeks in Treatment: 83 Information Obtained from: Patient Chief Complaint Left foot ulcer Electronic Signature(s) Signed: 07/14/2023 9:08:01 AM By: [name] PA-C Previous Signature: 07/14/2023 8:42:38 AM Version By: [name] PA-C Entered By: [name] on 07/14/2023 09:08:01 -------------------------------------------------------------------------------- Debridement Details Patient Name: Date of Service: [name] 07/14/2023 8:30 A M Medical Record Number: [id number] Patient Account Number: [id number] Date of Birth/Sex: Treating RN: 12-28-1975 (47 y.o. Esmeralda Links Primary Care Provider: [name] Other Clinician: [name] Referring Provider: Treating Provider/Extender: [name] in Treatment: 69 Debridement Performed for Assessment: Wound #12R Left,Medial,Plantar Foot Performed By: Physician [name], PA-C Debridement Type: Debridement Severity of Tissue Pre Debridement: Fat layer exposed Level of Consciousness (Pre-procedure): Awake and Alert Pre-procedure Verification/Time Out Yes - 09:11 Taken: Start Time: 09:11 Percent of Wound Bed Debrided: 100% T Area Debrided (cm): otal 0.16 Tissue and other material debrided: Viable, Non-Viable, Callus, Slough, Subcutaneous, Slough Level: Skin/Subcutaneous Tissue Debridement Description: Excisional Instrument: Curette Bleeding: Minimum Hemostasis Achieved: Pressure Response to  Treatment: Procedure was tolerated well Level of Consciousness (Post- Awake and Alert procedure): Post Debridement Measurements of Total Wound Length: (cm) 0.4 Width: (cm) 0.5 Depth: (cm) 0.2 Volume: (cm) 0.031 Character of Wound/Ulcer Post Debridement: Stable Severity of Tissue Post Debridement: Fat layer exposed Post Procedure Diagnosis Same as Pre-procedure Electronic Signature(s) Signed: 07/14/2023 12:10:54 PM By: Angelina Pih Signed: 07/14/2023 12:35:12 PM By: [name] PA-C Signed: 07/17/2023 5:11:42 PM By: [name] Entered By: [name] on 07/14/2023 09:16:10 Earmon Phoenix A ([id number]) [id number] Page 2 of 20 -------------------------------------------------------------------------------- HPI Details Patient Name: Date of Service: SHERRIE, MARSAN 07/14/2023 8:30 A M Medical Record Number: [id number] Patient Account Number: [id number] Date of Birth/Sex: Treating RN: 1976/09/05 (47 y.o. Esmeralda Links Primary Care Provider: [name] Other Clinician: [name] Referring Provider: Treating Provider/Extender: [name] in Treatment: 53 History of Present Illness HPI Description: 01/18/18-She is here for initial evaluation of the left great toe ulcer. She is a poor historian in regards to timeframe in detail. She states approximately 4 weeks ago she lacerated her toe on something in the house. She followed up with her primary care who placed her on Bactrim and ultimately a second dose of Bactrim prior to coming to wound clinic. She states she has been treating the toe with peroxide, Betadine and a Band-Aid. She did not check her blood sugar this morning but checked it yesterday morning it was 327; she is unaware of a recent A1c and there are no current records. She saw Dr. she would've orthopedics last week for an old injury to the left ankle, she states he did not see her toe, nor did she bring it  to his attention. She smokes approximately 1 pack cigarettes a day. Her social situation is concerning, she arrives this morning with her mother who appears extremely intoxicated/under the influence; her mother was asked to leave the room and be monitored by the patient's grandmother. The patient's aunt then accompanied the patient and the room throughout the rest of the appointment. We had a lengthy discussion regarding the  Dressing: Hydrofera Blue Ready Transfer Foam, 2.5x2.5 (in/in) ary Discharge Instructions: Apply Hydrofera Blue Ready to wound bed as directed Secondary Dressing: ABD Pad 5x9 (in/in) Discharge Instructions: Cover with ABD pad Electronic Signature(s) Signed: 07/25/2023 4:58:15 PM By: [name] PA-C Signed: 07/27/2023 9:06:03 AM By: [name] Previous Signature: 07/14/2023 12:35:12 PM Version By: [name] PA-C Previous Signature: 07/17/2023 5:11:42 PM Version By: Sarita Haver, Ashyia A ([id number]) [id number] Page 10 of 20 Previous Signature: 07/17/2023 5:11:42 PM Version By: [name] Entered By: [name] on 07/25/2023 12:54:50 -------------------------------------------------------------------------------- Problem List Details Patient Name: Date of Service: [name] 07/14/2023 8:30 A M Medical Record Number: [id number] Patient Account Number: [id number] Date of Birth/Sex: Treating RN: 06-Jun-1976 (47 y.o. 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 03/16/2022 No Yes Z79.4 Long term (current) use of insulin 02/22/2023 No Yes N18.6 End stage renal disease 07/14/2023 No Yes Z99.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 07/14/2023 No Yes Inactive Problems Resolved Problems ICD-10 Code Description Active Date Resolved Date S81.801A Unspecified open wound, right lower leg, initial encounter 05/17/2023 05/17/2023 W54.0XXA Bitten by dog, initial encounter 05/17/2023 05/17/2023 L97.512 Non-pressure chronic ulcer of other part of right foot with fat layer exposed 03/16/2022 03/16/2022 L97.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 of skin 03/16/2022 03/16/2022 U27.253 Type 2 diabetes mellitus with other skin ulcer 05/17/2023 05/17/2023 Electronic Signature(s) Signed: 07/14/2023 9:07:46 AM By: [name] PA-C Previous Signature: 07/14/2023 8:42:17 AM Version By: [name] PA-C Entered By: [name] on 07/14/2023 09:07:46 Earmon Phoenix A ([id number]) [id number] Page 11 of 20 -------------------------------------------------------------------------------- Progress Note Details Patient Name: Date of Service: WILLADEAN, GUYTON 07/14/2023 8:30 A M Medical Record Number: [id number] Patient Account Number: [id number] Date of Birth/Sex: Treating RN: 1976/01/27 (47 y.o. Esmeralda Links Primary Care Provider: [name] Other Clinician: [name] Referring Provider: Treating Provider/Extender: [name] in Treatment: 50 Subjective Chief Complaint Information obtained from Patient Left foot ulcer History of Present Illness (HPI) 01/18/18-She is here for initial evaluation of the left great toe ulcer. She is a poor historian in regards to timeframe in detail. She states approximately 4 weeks ago she lacerated her toe on something in the house. She followed up with her primary care who placed her on Bactrim and ultimately a second dose of Bactrim prior to coming to wound clinic. She states she has been treating the toe with peroxide, Betadine and a Band-Aid. She did not check her blood sugar this morning but checked it yesterday morning it was 327; she is unaware of a recent A1c and there are no current records. She saw Dr. she would've orthopedics last week for an old injury to the left ankle, she states he did not see her toe, nor did she bring it to his attention. She smokes approximately 1 pack cigarettes a day. Her social situation is concerning, she arrives this morning with her mother who appears extremely intoxicated/under the influence; her mother was asked to leave the room and be monitored by the patient's grandmother. The patient's aunt then accompanied the patient and the room  MARCHELLA, HIBBARD A ([id number]) [id number] Page 1 of 20 Visit Report for 07/14/2023 Chief Complaint Document Details Patient Name: Date of Service: KRISTIN, BARCUS 07/14/2023 8:30 A M Medical Record Number: [id number] Patient Account Number: [id number] Date of Birth/Sex: Treating RN: Jun 16, 1976 (47 y.o. Esmeralda Links Primary Care Provider: [name] Other Clinician: [name] Referring Provider: Treating Provider/Extender: [name] Weeks in Treatment: 83 Information Obtained from: Patient Chief Complaint Left foot ulcer Electronic Signature(s) Signed: 07/14/2023 9:08:01 AM By: [name] PA-C Previous Signature: 07/14/2023 8:42:38 AM Version By: [name] PA-C Entered By: [name] on 07/14/2023 09:08:01 -------------------------------------------------------------------------------- Debridement Details Patient Name: Date of Service: [name] 07/14/2023 8:30 A M Medical Record Number: [id number] Patient Account Number: [id number] Date of Birth/Sex: Treating RN: 12-28-1975 (47 y.o. Esmeralda Links Primary Care Provider: [name] Other Clinician: [name] Referring Provider: Treating Provider/Extender: [name] in Treatment: 69 Debridement Performed for Assessment: Wound #12R Left,Medial,Plantar Foot Performed By: Physician [name], PA-C Debridement Type: Debridement Severity of Tissue Pre Debridement: Fat layer exposed Level of Consciousness (Pre-procedure): Awake and Alert Pre-procedure Verification/Time Out Yes - 09:11 Taken: Start Time: 09:11 Percent of Wound Bed Debrided: 100% T Area Debrided (cm): otal 0.16 Tissue and other material debrided: Viable, Non-Viable, Callus, Slough, Subcutaneous, Slough Level: Skin/Subcutaneous Tissue Debridement Description: Excisional Instrument: Curette Bleeding: Minimum Hemostasis Achieved: Pressure Response to  Treatment: Procedure was tolerated well Level of Consciousness (Post- Awake and Alert procedure): Post Debridement Measurements of Total Wound Length: (cm) 0.4 Width: (cm) 0.5 Depth: (cm) 0.2 Volume: (cm) 0.031 Character of Wound/Ulcer Post Debridement: Stable Severity of Tissue Post Debridement: Fat layer exposed Post Procedure Diagnosis Same as Pre-procedure Electronic Signature(s) Signed: 07/14/2023 12:10:54 PM By: Angelina Pih Signed: 07/14/2023 12:35:12 PM By: [name] PA-C Signed: 07/17/2023 5:11:42 PM By: [name] Entered By: [name] on 07/14/2023 09:16:10 Earmon Phoenix A ([id number]) [id number] Page 2 of 20 -------------------------------------------------------------------------------- HPI Details Patient Name: Date of Service: SHERRIE, MARSAN 07/14/2023 8:30 A M Medical Record Number: [id number] Patient Account Number: [id number] Date of Birth/Sex: Treating RN: 1976/09/05 (47 y.o. Esmeralda Links Primary Care Provider: [name] Other Clinician: [name] Referring Provider: Treating Provider/Extender: [name] in Treatment: 53 History of Present Illness HPI Description: 01/18/18-She is here for initial evaluation of the left great toe ulcer. She is a poor historian in regards to timeframe in detail. She states approximately 4 weeks ago she lacerated her toe on something in the house. She followed up with her primary care who placed her on Bactrim and ultimately a second dose of Bactrim prior to coming to wound clinic. She states she has been treating the toe with peroxide, Betadine and a Band-Aid. She did not check her blood sugar this morning but checked it yesterday morning it was 327; she is unaware of a recent A1c and there are no current records. She saw Dr. she would've orthopedics last week for an old injury to the left ankle, she states he did not see her toe, nor did she bring it  to his attention. She smokes approximately 1 pack cigarettes a day. Her social situation is concerning, she arrives this morning with her mother who appears extremely intoxicated/under the influence; her mother was asked to leave the room and be monitored by the patient's grandmother. The patient's aunt then accompanied the patient and the room throughout the rest of the appointment. We had a lengthy discussion regarding the  right plantar heel and a small wound on the right anterior lower leg. We have been using Hydrofera Blue in the left heel Aquacel on the right heel. The left heel has been in a total contact cast 8/21; patient presents for follow-up. We have been using Hydrofera Blue to the left foot wound under the total contact cast. T the right heel patient has been o using Aquacel Ag. T the anterior leg wound antibiotic ointment. All wounds have healed. o 8/28; patient presents for follow-up. She has been using her surgical shoe with offloading felt pads. Her wounds have remained closed. 9/11; patient presents for final follow-up to assure that her wounds have remained closed. Unfortunately the left foot wound has opened again. But she has been wearing her Surgical shoe for offloading with felt pad. 07-14-2023 upon evaluation today patient appears to be doing well currently in regard to her wound. She has been tolerating the dressing changes without complication with total contact casting. I am going to be helping take care of her at this point as she is transferring from Dr. Mikey Bussing to me since Dr. Mikey Bussing is on maternity leave. Electronic Signature(s) Signed: 07/14/2023 9:41:51 AM By:  [name] PA-C Entered By: [name] on 07/14/2023 09:41:50 -------------------------------------------------------------------------------- Physical Exam Details Patient Name: Date of Service: CACI, ORREN 07/14/2023 8:30 A M Medical Record Number: [id number] Patient Account Number: [id number] Date of Birth/Sex: Treating RN: [birth year] (47 y.o. Esmeralda Links Primary Care Provider: [name] Other Clinician: [name] Referring Provider: Treating Provider/Extender: [name] Weeks in Treatment: [address] Aashka A ([id number]) [id number] Page 9 of 20 Constitutional Well-nourished and well-hydrated in no acute distress. Respiratory normal breathing without difficulty. Psychiatric this patient is able to make decisions and demonstrates good insight into disease process. Alert and Oriented x 3. pleasant and cooperative. Notes Upon inspection patient's wound bed actually showed signs of good granulation epithelization at this point. Fortunately I do not see any signs of worsening overall and the patient is doing quite well at this time. I do not see that she seems to be having any troubles with the cast and I think she is always done quite well with the cast to be honest. Electronic Signature(s) Signed: 07/14/2023 9:42:14 AM By: [name] PA-C Entered By: [name] on 07/14/2023 09:42:13 -------------------------------------------------------------------------------- Physician Orders Details Patient Name: Date of Service: [name] 07/14/2023 8:30 A M Medical Record Number: [id number] Patient Account Number: [id number] Date of Birth/Sex: Treating RN: Dec 28, 1975 (47 y.o. 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 Bathing/ Shower/ Hygiene May shower; gently cleanse wound with antibacterial soap, rinse and pat dry prior to dressing wounds No tub bath. Off-Loading Total Contact Cast to Left Lower Extremity Open toe surgical shoe - right foot Wound Treatment Wound #12R - Foot Wound Laterality: Plantar, Left, Medial Cleanser: Vashe 5.8 (oz) Discharge Instructions: Use vashe 5.8 (oz) as directed Topical: Gentamicin Discharge Instructions: Apply as directed by provider. Prim  Dressing: Hydrofera Blue Ready Transfer Foam, 2.5x2.5 (in/in) ary Discharge Instructions: Apply Hydrofera Blue Ready to wound bed as directed Secondary Dressing: ABD Pad 5x9 (in/in) Discharge Instructions: Cover with ABD pad Electronic Signature(s) Signed: 07/25/2023 4:58:15 PM By: [name] PA-C Signed: 07/27/2023 9:06:03 AM By: [name] Previous Signature: 07/14/2023 12:35:12 PM Version By: [name] PA-C Previous Signature: 07/17/2023 5:11:42 PM Version By: Sarita Haver, Ashyia A ([id number]) [id number] Page 10 of 20 Previous Signature: 07/17/2023 5:11:42 PM Version By: [name] Entered By: [name] on 07/25/2023 12:54:50 -------------------------------------------------------------------------------- Problem List Details Patient Name: Date of Service: [name] 07/14/2023 8:30 A M Medical Record Number: [id number] Patient Account Number: [id number] Date of Birth/Sex: Treating RN: 06-Jun-1976 (47 y.o. 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 03/16/2022 No Yes Z79.4 Long term (current) use of insulin 02/22/2023 No Yes N18.6 End stage renal disease 07/14/2023 No Yes Z99.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 07/14/2023 No Yes Inactive Problems Resolved Problems ICD-10 Code Description Active Date Resolved Date S81.801A Unspecified open wound, right lower leg, initial encounter 05/17/2023 05/17/2023 W54.0XXA Bitten by dog, initial encounter 05/17/2023 05/17/2023 L97.512 Non-pressure chronic ulcer of other part of right foot with fat layer exposed 03/16/2022 03/16/2022 L97.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 of skin 03/16/2022 03/16/2022 U27.253 Type 2 diabetes mellitus with other skin ulcer 05/17/2023 05/17/2023 Electronic Signature(s) Signed: 07/14/2023 9:07:46 AM By: [name] PA-C Previous Signature: 07/14/2023 8:42:17 AM Version By: [name] PA-C Entered By: [name] on 07/14/2023 09:07:46 Earmon Phoenix A ([id number]) [id number] Page 11 of 20 -------------------------------------------------------------------------------- Progress Note Details Patient Name: Date of Service: WILLADEAN, GUYTON 07/14/2023 8:30 A M Medical Record Number: [id number] Patient Account Number: [id number] Date of Birth/Sex: Treating RN: 1976/01/27 (47 y.o. Esmeralda Links Primary Care Provider: [name] Other Clinician: [name] Referring Provider: Treating Provider/Extender: [name] in Treatment: 50 Subjective Chief Complaint Information obtained from Patient Left foot ulcer History of Present Illness (HPI) 01/18/18-She is here for initial evaluation of the left great toe ulcer. She is a poor historian in regards to timeframe in detail. She states approximately 4 weeks ago she lacerated her toe on something in the house. She followed up with her primary care who placed her on Bactrim and ultimately a second dose of Bactrim prior to coming to wound clinic. She states she has been treating the toe with peroxide, Betadine and a Band-Aid. She did not check her blood sugar this morning but checked it yesterday morning it was 327; she is unaware of a recent A1c and there are no current records. She saw Dr. she would've orthopedics last week for an old injury to the left ankle, she states he did not see her toe, nor did she bring it to his attention. She smokes approximately 1 pack cigarettes a day. Her social situation is concerning, she arrives this morning with her mother who appears extremely intoxicated/under the influence; her mother was asked to leave the room and be monitored by the patient's grandmother. The patient's aunt then accompanied the patient and the room  Dressing: Hydrofera Blue Ready Transfer Foam, 2.5x2.5 (in/in) ary Discharge Instructions: Apply Hydrofera Blue Ready to wound bed as directed Secondary Dressing: ABD Pad 5x9 (in/in) Discharge Instructions: Cover with ABD pad Electronic Signature(s) Signed: 07/25/2023 4:58:15 PM By: [name] PA-C Signed: 07/27/2023 9:06:03 AM By: [name] Previous Signature: 07/14/2023 12:35:12 PM Version By: [name] PA-C Previous Signature: 07/17/2023 5:11:42 PM Version By: Sarita Haver, Ashyia A ([id number]) [id number] Page 10 of 20 Previous Signature: 07/17/2023 5:11:42 PM Version By: [name] Entered By: [name] on 07/25/2023 12:54:50 -------------------------------------------------------------------------------- Problem List Details Patient Name: Date of Service: [name] 07/14/2023 8:30 A M Medical Record Number: [id number] Patient Account Number: [id number] Date of Birth/Sex: Treating RN: 06-Jun-1976 (47 y.o. 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 03/16/2022 No Yes Z79.4 Long term (current) use of insulin 02/22/2023 No Yes N18.6 End stage renal disease 07/14/2023 No Yes Z99.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 07/14/2023 No Yes Inactive Problems Resolved Problems ICD-10 Code Description Active Date Resolved Date S81.801A Unspecified open wound, right lower leg, initial encounter 05/17/2023 05/17/2023 W54.0XXA Bitten by dog, initial encounter 05/17/2023 05/17/2023 L97.512 Non-pressure chronic ulcer of other part of right foot with fat layer exposed 03/16/2022 03/16/2022 L97.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 of skin 03/16/2022 03/16/2022 U27.253 Type 2 diabetes mellitus with other skin ulcer 05/17/2023 05/17/2023 Electronic Signature(s) Signed: 07/14/2023 9:07:46 AM By: [name] PA-C Previous Signature: 07/14/2023 8:42:17 AM Version By: [name] PA-C Entered By: [name] on 07/14/2023 09:07:46 Earmon Phoenix A ([id number]) [id number] Page 11 of 20 -------------------------------------------------------------------------------- Progress Note Details Patient Name: Date of Service: WILLADEAN, GUYTON 07/14/2023 8:30 A M Medical Record Number: [id number] Patient Account Number: [id number] Date of Birth/Sex: Treating RN: 1976/01/27 (47 y.o. Esmeralda Links Primary Care Provider: [name] Other Clinician: [name] Referring Provider: Treating Provider/Extender: [name] in Treatment: 50 Subjective Chief Complaint Information obtained from Patient Left foot ulcer History of Present Illness (HPI) 01/18/18-She is here for initial evaluation of the left great toe ulcer. She is a poor historian in regards to timeframe in detail. She states approximately 4 weeks ago she lacerated her toe on something in the house. She followed up with her primary care who placed her on Bactrim and ultimately a second dose of Bactrim prior to coming to wound clinic. She states she has been treating the toe with peroxide, Betadine and a Band-Aid. She did not check her blood sugar this morning but checked it yesterday morning it was 327; she is unaware of a recent A1c and there are no current records. She saw Dr. she would've orthopedics last week for an old injury to the left ankle, she states he did not see her toe, nor did she bring it to his attention. She smokes approximately 1 pack cigarettes a day. Her social situation is concerning, she arrives this morning with her mother who appears extremely intoxicated/under the influence; her mother was asked to leave the room and be monitored by the patient's grandmother. The patient's aunt then accompanied the patient and the room  Dressing: Hydrofera Blue Ready Transfer Foam, 2.5x2.5 (in/in) ary Discharge Instructions: Apply Hydrofera Blue Ready to wound bed as directed Secondary Dressing: ABD Pad 5x9 (in/in) Discharge Instructions: Cover with ABD pad Electronic Signature(s) Signed: 07/25/2023 4:58:15 PM By: [name] PA-C Signed: 07/27/2023 9:06:03 AM By: [name] Previous Signature: 07/14/2023 12:35:12 PM Version By: [name] PA-C Previous Signature: 07/17/2023 5:11:42 PM Version By: Sarita Haver, Ashyia A ([id number]) [id number] Page 10 of 20 Previous Signature: 07/17/2023 5:11:42 PM Version By: [name] Entered By: [name] on 07/25/2023 12:54:50 -------------------------------------------------------------------------------- Problem List Details Patient Name: Date of Service: [name] 07/14/2023 8:30 A M Medical Record Number: [id number] Patient Account Number: [id number] Date of Birth/Sex: Treating RN: 06-Jun-1976 (47 y.o. 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 03/16/2022 No Yes Z79.4 Long term (current) use of insulin 02/22/2023 No Yes N18.6 End stage renal disease 07/14/2023 No Yes Z99.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 07/14/2023 No Yes Inactive Problems Resolved Problems ICD-10 Code Description Active Date Resolved Date S81.801A Unspecified open wound, right lower leg, initial encounter 05/17/2023 05/17/2023 W54.0XXA Bitten by dog, initial encounter 05/17/2023 05/17/2023 L97.512 Non-pressure chronic ulcer of other part of right foot with fat layer exposed 03/16/2022 03/16/2022 L97.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 of skin 03/16/2022 03/16/2022 U27.253 Type 2 diabetes mellitus with other skin ulcer 05/17/2023 05/17/2023 Electronic Signature(s) Signed: 07/14/2023 9:07:46 AM By: [name] PA-C Previous Signature: 07/14/2023 8:42:17 AM Version By: [name] PA-C Entered By: [name] on 07/14/2023 09:07:46 Earmon Phoenix A ([id number]) [id number] Page 11 of 20 -------------------------------------------------------------------------------- Progress Note Details Patient Name: Date of Service: WILLADEAN, GUYTON 07/14/2023 8:30 A M Medical Record Number: [id number] Patient Account Number: [id number] Date of Birth/Sex: Treating RN: 1976/01/27 (47 y.o. Esmeralda Links Primary Care Provider: [name] Other Clinician: [name] Referring Provider: Treating Provider/Extender: [name] in Treatment: 50 Subjective Chief Complaint Information obtained from Patient Left foot ulcer History of Present Illness (HPI) 01/18/18-She is here for initial evaluation of the left great toe ulcer. She is a poor historian in regards to timeframe in detail. She states approximately 4 weeks ago she lacerated her toe on something in the house. She followed up with her primary care who placed her on Bactrim and ultimately a second dose of Bactrim prior to coming to wound clinic. She states she has been treating the toe with peroxide, Betadine and a Band-Aid. She did not check her blood sugar this morning but checked it yesterday morning it was 327; she is unaware of a recent A1c and there are no current records. She saw Dr. she would've orthopedics last week for an old injury to the left ankle, she states he did not see her toe, nor did she bring it to his attention. She smokes approximately 1 pack cigarettes a day. Her social situation is concerning, she arrives this morning with her mother who appears extremely intoxicated/under the influence; her mother was asked to leave the room and be monitored by the patient's grandmother. The patient's aunt then accompanied the patient and the room  Dressing: Hydrofera Blue Ready Transfer Foam, 2.5x2.5 (in/in) ary Discharge Instructions: Apply Hydrofera Blue Ready to wound bed as directed Secondary Dressing: ABD Pad 5x9 (in/in) Discharge Instructions: Cover with ABD pad Electronic Signature(s) Signed: 07/25/2023 4:58:15 PM By: [name] PA-C Signed: 07/27/2023 9:06:03 AM By: [name] Previous Signature: 07/14/2023 12:35:12 PM Version By: [name] PA-C Previous Signature: 07/17/2023 5:11:42 PM Version By: Sarita Haver, Ashyia A ([id number]) [id number] Page 10 of 20 Previous Signature: 07/17/2023 5:11:42 PM Version By: [name] Entered By: [name] on 07/25/2023 12:54:50 -------------------------------------------------------------------------------- Problem List Details Patient Name: Date of Service: [name] 07/14/2023 8:30 A M Medical Record Number: [id number] Patient Account Number: [id number] Date of Birth/Sex: Treating RN: 06-Jun-1976 (47 y.o. 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 03/16/2022 No Yes Z79.4 Long term (current) use of insulin 02/22/2023 No Yes N18.6 End stage renal disease 07/14/2023 No Yes Z99.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 07/14/2023 No Yes Inactive Problems Resolved Problems ICD-10 Code Description Active Date Resolved Date S81.801A Unspecified open wound, right lower leg, initial encounter 05/17/2023 05/17/2023 W54.0XXA Bitten by dog, initial encounter 05/17/2023 05/17/2023 L97.512 Non-pressure chronic ulcer of other part of right foot with fat layer exposed 03/16/2022 03/16/2022 L97.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 of skin 03/16/2022 03/16/2022 U27.253 Type 2 diabetes mellitus with other skin ulcer 05/17/2023 05/17/2023 Electronic Signature(s) Signed: 07/14/2023 9:07:46 AM By: [name] PA-C Previous Signature: 07/14/2023 8:42:17 AM Version By: [name] PA-C Entered By: [name] on 07/14/2023 09:07:46 Earmon Phoenix A ([id number]) [id number] Page 11 of 20 -------------------------------------------------------------------------------- Progress Note Details Patient Name: Date of Service: WILLADEAN, GUYTON 07/14/2023 8:30 A M Medical Record Number: [id number] Patient Account Number: [id number] Date of Birth/Sex: Treating RN: 1976/01/27 (47 y.o. Esmeralda Links Primary Care Provider: [name] Other Clinician: [name] Referring Provider: Treating Provider/Extender: [name] in Treatment: 50 Subjective Chief Complaint Information obtained from Patient Left foot ulcer History of Present Illness (HPI) 01/18/18-She is here for initial evaluation of the left great toe ulcer. She is a poor historian in regards to timeframe in detail. She states approximately 4 weeks ago she lacerated her toe on something in the house. She followed up with her primary care who placed her on Bactrim and ultimately a second dose of Bactrim prior to coming to wound clinic. She states she has been treating the toe with peroxide, Betadine and a Band-Aid. She did not check her blood sugar this morning but checked it yesterday morning it was 327; she is unaware of a recent A1c and there are no current records. She saw Dr. she would've orthopedics last week for an old injury to the left ankle, she states he did not see her toe, nor did she bring it to his attention. She smokes approximately 1 pack cigarettes a day. Her social situation is concerning, she arrives this morning with her mother who appears extremely intoxicated/under the influence; her mother was asked to leave the room and be monitored by the patient's grandmother. The patient's aunt then accompanied the patient and the room  MARCHELLA, HIBBARD A ([id number]) [id number] Page 1 of 20 Visit Report for 07/14/2023 Chief Complaint Document Details Patient Name: Date of Service: KRISTIN, BARCUS 07/14/2023 8:30 A M Medical Record Number: [id number] Patient Account Number: [id number] Date of Birth/Sex: Treating RN: Jun 16, 1976 (47 y.o. Esmeralda Links Primary Care Provider: [name] Other Clinician: [name] Referring Provider: Treating Provider/Extender: [name] Weeks in Treatment: 83 Information Obtained from: Patient Chief Complaint Left foot ulcer Electronic Signature(s) Signed: 07/14/2023 9:08:01 AM By: [name] PA-C Previous Signature: 07/14/2023 8:42:38 AM Version By: [name] PA-C Entered By: [name] on 07/14/2023 09:08:01 -------------------------------------------------------------------------------- Debridement Details Patient Name: Date of Service: [name] 07/14/2023 8:30 A M Medical Record Number: [id number] Patient Account Number: [id number] Date of Birth/Sex: Treating RN: 12-28-1975 (47 y.o. Esmeralda Links Primary Care Provider: [name] Other Clinician: [name] Referring Provider: Treating Provider/Extender: [name] in Treatment: 69 Debridement Performed for Assessment: Wound #12R Left,Medial,Plantar Foot Performed By: Physician [name], PA-C Debridement Type: Debridement Severity of Tissue Pre Debridement: Fat layer exposed Level of Consciousness (Pre-procedure): Awake and Alert Pre-procedure Verification/Time Out Yes - 09:11 Taken: Start Time: 09:11 Percent of Wound Bed Debrided: 100% T Area Debrided (cm): otal 0.16 Tissue and other material debrided: Viable, Non-Viable, Callus, Slough, Subcutaneous, Slough Level: Skin/Subcutaneous Tissue Debridement Description: Excisional Instrument: Curette Bleeding: Minimum Hemostasis Achieved: Pressure Response to  Treatment: Procedure was tolerated well Level of Consciousness (Post- Awake and Alert procedure): Post Debridement Measurements of Total Wound Length: (cm) 0.4 Width: (cm) 0.5 Depth: (cm) 0.2 Volume: (cm) 0.031 Character of Wound/Ulcer Post Debridement: Stable Severity of Tissue Post Debridement: Fat layer exposed Post Procedure Diagnosis Same as Pre-procedure Electronic Signature(s) Signed: 07/14/2023 12:10:54 PM By: Angelina Pih Signed: 07/14/2023 12:35:12 PM By: [name] PA-C Signed: 07/17/2023 5:11:42 PM By: [name] Entered By: [name] on 07/14/2023 09:16:10 Earmon Phoenix A ([id number]) [id number] Page 2 of 20 -------------------------------------------------------------------------------- HPI Details Patient Name: Date of Service: SHERRIE, MARSAN 07/14/2023 8:30 A M Medical Record Number: [id number] Patient Account Number: [id number] Date of Birth/Sex: Treating RN: 1976/09/05 (47 y.o. Esmeralda Links Primary Care Provider: [name] Other Clinician: [name] Referring Provider: Treating Provider/Extender: [name] in Treatment: 53 History of Present Illness HPI Description: 01/18/18-She is here for initial evaluation of the left great toe ulcer. She is a poor historian in regards to timeframe in detail. She states approximately 4 weeks ago she lacerated her toe on something in the house. She followed up with her primary care who placed her on Bactrim and ultimately a second dose of Bactrim prior to coming to wound clinic. She states she has been treating the toe with peroxide, Betadine and a Band-Aid. She did not check her blood sugar this morning but checked it yesterday morning it was 327; she is unaware of a recent A1c and there are no current records. She saw Dr. she would've orthopedics last week for an old injury to the left ankle, she states he did not see her toe, nor did she bring it  to his attention. She smokes approximately 1 pack cigarettes a day. Her social situation is concerning, she arrives this morning with her mother who appears extremely intoxicated/under the influence; her mother was asked to leave the room and be monitored by the patient's grandmother. The patient's aunt then accompanied the patient and the room throughout the rest of the appointment. We had a lengthy discussion regarding the  Wound/Ulcer Post Debridement is stable. Severity of Tissue Post Debridement is: Fat layer exposed. Post procedure Diagnosis Wound #12R: Same as Pre-Procedure Pre-procedure diagnosis of Wound #12R is a Diabetic Wound/Ulcer of the Lower Extremity located on the Left,Medial,Plantar Foot . There was a T Contact otal Cast Procedure by [name], PA-C. Post procedure Diagnosis Wound #12R: Same as Pre-Procedure Plan Follow-up Appointments: Return  Appointment in 2 weeks. Bathing/ Shower/ Hygiene: May shower; gently cleanse wound with antibacterial soap, rinse and pat dry prior to dressing wounds No tub bath. Off-Loading: T Contact Cast to Left Lower Extremity otal Open toe surgical shoe - right foot WOUND #12R: - Foot Wound Laterality: Plantar, Left, Medial Cleanser: Vashe 5.8 (oz) Discharge Instructions: Use vashe 5.8 (oz) as directed Topical: Gentamicin Discharge Instructions: Apply as directed by provider. Prim Dressing: Hydrofera Blue Ready Transfer Foam, 2.5x2.5 (in/in) ary Discharge Instructions: Apply Hydrofera Blue Ready to wound bed as directed Secondary Dressing: ABD Pad 5x9 (in/in) Discharge Instructions: Cover with ABD pad 1. I would recommend that we have the patient going continue to monitor for any signs of infection or worsening. Will get a try to get her on the schedule where she can come on Mondays as opposed to Fridays as this works better with her dialysis I think that is okay. We will see how things do next week and she is in agreement with the plan. 2. I am going to recommend as well that the patient should continue to utilize the gentamicin followed by the [facility] with a radial and ABD pad to cover. We will see patient back for reevaluation in 1 week here in the clinic. If anything worsens or changes patient will contact our office for additional recommendations. TYQUASIA, PANT A ([id number]) [id number] Page 19 of 20 Electronic Signature(s) Signed: 07/14/2023 9:42:45 AM By: [name] PA-C Entered By: [name] on 07/14/2023 09:42:45 -------------------------------------------------------------------------------- Total Contact Cast Details Patient Name: Date of Service: GERTURDE, KUBA 07/14/2023 8:30 A M Medical Record Number: [id number] Patient Account Number: [id number] Date of Birth/Sex: Treating RN: 03-Jul-1976 (47 y.o. Esmeralda Links Primary Care Provider:  [name] Other Clinician: [name] Referring Provider: Treating Provider/Extender: [name] Weeks in Treatment: 78 T Contact Cast Applied for Wound Assessment: otal Wound #12R Left,Medial,Plantar Foot Performed By: Physician [name], PA-C Post Procedure Diagnosis Same as Pre-procedure Electronic Signature(s) Signed: 07/14/2023 12:35:12 PM By: [name] PA-C Signed: 07/17/2023 5:11:42 PM By: [name] Entered By: [name] on 07/14/2023 09:16:42 -------------------------------------------------------------------------------- SuperBill Details Patient Name: Date of Service: [name] 07/14/2023 Medical Record Number: [id number] Patient Account Number: [id number] Date of Birth/Sex: Treating RN: 1976/01/08 (47 y.o. 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 Severity of Tissue Post Debridement is: Fat layer exposed. Post procedure Diagnosis Wound #12R: Same as Pre-Procedure Pre-procedure diagnosis of Wound #12R is a Diabetic Wound/Ulcer of the Lower Extremity located on the Left,Medial,Plantar Foot . There was a T Contact otal Cast Procedure by [name], PA-C. Post procedure Diagnosis Wound #12R: Same as Pre-Procedure Plan Follow-up Appointments: Return  Appointment in 2 weeks. Bathing/ Shower/ Hygiene: May shower; gently cleanse wound with antibacterial soap, rinse and pat dry prior to dressing wounds No tub bath. Off-Loading: T Contact Cast to Left Lower Extremity otal Open toe surgical shoe - right foot WOUND #12R: - Foot Wound Laterality: Plantar, Left, Medial Cleanser: Vashe 5.8 (oz) Discharge Instructions: Use vashe 5.8 (oz) as directed Topical: Gentamicin Discharge Instructions: Apply as directed by provider. Prim Dressing: Hydrofera Blue Ready Transfer Foam, 2.5x2.5 (in/in) ary Discharge Instructions: Apply Hydrofera Blue Ready to wound bed as directed Secondary Dressing: ABD Pad 5x9 (in/in) Discharge Instructions: Cover with ABD pad 1. I would recommend that we have the patient going continue to monitor for any signs of infection or worsening. Will get a try to get her on the schedule where she can come on Mondays as opposed to Fridays as this works better with her dialysis I think that is okay. We will see how things do next week and she is in agreement with the plan. 2. I am going to recommend as well that the patient should continue to utilize the gentamicin followed by the [facility] with a radial and ABD pad to cover. We will see patient back for reevaluation in 1 week here in the clinic. If anything worsens or changes patient will contact our office for additional recommendations. TYQUASIA, PANT A ([id number]) [id number] Page 19 of 20 Electronic Signature(s) Signed: 07/14/2023 9:42:45 AM By: [name] PA-C Entered By: [name] on 07/14/2023 09:42:45 -------------------------------------------------------------------------------- Total Contact Cast Details Patient Name: Date of Service: GERTURDE, KUBA 07/14/2023 8:30 A M Medical Record Number: [id number] Patient Account Number: [id number] Date of Birth/Sex: Treating RN: 03-Jul-1976 (47 y.o. Esmeralda Links Primary Care Provider:  [name] Other Clinician: [name] Referring Provider: Treating Provider/Extender: [name] Weeks in Treatment: 78 T Contact Cast Applied for Wound Assessment: otal Wound #12R Left,Medial,Plantar Foot Performed By: Physician [name], PA-C Post Procedure Diagnosis Same as Pre-procedure Electronic Signature(s) Signed: 07/14/2023 12:35:12 PM By: [name] PA-C Signed: 07/17/2023 5:11:42 PM By: [name] Entered By: [name] on 07/14/2023 09:16:42 -------------------------------------------------------------------------------- SuperBill Details Patient Name: Date of Service: [name] 07/14/2023 Medical Record Number: [id number] Patient Account Number: [id number] Date of Birth/Sex: Treating RN: 1976/01/08 (47 y.o. 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 Z79.4 Long term (current) use of insulin N18.6 End stage renal disease Z99.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 Facility Procedures : CPT4 Code: [id number] Description: 11042 - DEB SUBQ TISSUE 20 SQ CM/< ICD-10 Diagnosis Description 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 Esmeralda Links Primary Care Provider: [name] Other Clinician: [name] Referring Provider: Treating Provider/Extender: [name] Weeks in Treatment: 83 Information Obtained from: Patient Chief Complaint Left foot ulcer Electronic Signature(s) Signed: 07/14/2023 9:08:01 AM By: [name] PA-C Previous Signature: 07/14/2023 8:42:38 AM Version By: [name] PA-C Entered By: [name] on 07/14/2023 09:08:01 -------------------------------------------------------------------------------- Debridement Details Patient Name: Date of Service: [name] 07/14/2023 8:30 A M Medical Record Number: [id number] Patient Account Number: [id number] Date of Birth/Sex: Treating RN: 12-28-1975 (47 y.o. Esmeralda Links Primary Care Provider: [name] Other Clinician: [name] Referring Provider: Treating Provider/Extender: [name] in Treatment: 69 Debridement Performed for Assessment: Wound #12R Left,Medial,Plantar Foot Performed By: Physician [name], PA-C Debridement Type: Debridement Severity of Tissue Pre Debridement: Fat layer exposed Level of Consciousness (Pre-procedure): Awake and Alert Pre-procedure Verification/Time Out Yes - 09:11 Taken: Start Time: 09:11 Percent of Wound Bed Debrided: 100% T Area Debrided (cm): otal 0.16 Tissue and other material debrided: Viable, Non-Viable, Callus, Slough, Subcutaneous, Slough Level: Skin/Subcutaneous Tissue Debridement Description: Excisional Instrument: Curette Bleeding: Minimum Hemostasis Achieved: Pressure Response to  Treatment: Procedure was tolerated well Level of Consciousness (Post- Awake and Alert procedure): Post Debridement Measurements of Total Wound Length: (cm) 0.4 Width: (cm) 0.5 Depth: (cm) 0.2 Volume: (cm) 0.031 Character of Wound/Ulcer Post Debridement: Stable Severity of Tissue Post Debridement: Fat layer exposed Post Procedure Diagnosis Same as Pre-procedure Electronic Signature(s) Signed: 07/14/2023 12:10:54 PM By: Angelina Pih Signed: 07/14/2023 12:35:12 PM By: [name] PA-C Signed: 07/17/2023 5:11:42 PM By: [name] Entered By: [name] on 07/14/2023 09:16:10 Earmon Phoenix A ([id number]) [id number] Page 2 of 20 -------------------------------------------------------------------------------- HPI Details Patient Name: Date of Service: SHERRIE, MARSAN 07/14/2023 8:30 A M Medical Record Number: [id number] Patient Account Number: [id number] Date of Birth/Sex: Treating RN: 1976/09/05 (47 y.o. Esmeralda Links Primary Care Provider: [name] Other Clinician: [name] Referring Provider: Treating Provider/Extender: [name] in Treatment: 53 History of Present Illness HPI Description: 01/18/18-She is here for initial evaluation of the left great toe ulcer. She is a poor historian in regards to timeframe in detail. She states approximately 4 weeks ago she lacerated her toe on something in the house. She followed up with her primary care who placed her on Bactrim and ultimately a second dose of Bactrim prior to coming to wound clinic. She states she has been treating the toe with peroxide, Betadine and a Band-Aid. She did not check her blood sugar this morning but checked it yesterday morning it was 327; she is unaware of a recent A1c and there are no current records. She saw Dr. she would've orthopedics last week for an old injury to the left ankle, she states he did not see her toe, nor did she bring it  to his attention. She smokes approximately 1 pack cigarettes a day. Her social situation is concerning, she arrives this morning with her mother who appears extremely intoxicated/under the influence; her mother was asked to leave the room and be monitored by the patient's grandmother. The patient's aunt then accompanied the patient and the room throughout the rest of the appointment. We had a lengthy discussion regarding the  Wound/Ulcer Post Debridement is stable. Severity of Tissue Post Debridement is: Fat layer exposed. Post procedure Diagnosis Wound #12R: Same as Pre-Procedure Pre-procedure diagnosis of Wound #12R is a Diabetic Wound/Ulcer of the Lower Extremity located on the Left,Medial,Plantar Foot . There was a T Contact otal Cast Procedure by [name], PA-C. Post procedure Diagnosis Wound #12R: Same as Pre-Procedure Plan Follow-up Appointments: Return  Appointment in 2 weeks. Bathing/ Shower/ Hygiene: May shower; gently cleanse wound with antibacterial soap, rinse and pat dry prior to dressing wounds No tub bath. Off-Loading: T Contact Cast to Left Lower Extremity otal Open toe surgical shoe - right foot WOUND #12R: - Foot Wound Laterality: Plantar, Left, Medial Cleanser: Vashe 5.8 (oz) Discharge Instructions: Use vashe 5.8 (oz) as directed Topical: Gentamicin Discharge Instructions: Apply as directed by provider. Prim Dressing: Hydrofera Blue Ready Transfer Foam, 2.5x2.5 (in/in) ary Discharge Instructions: Apply Hydrofera Blue Ready to wound bed as directed Secondary Dressing: ABD Pad 5x9 (in/in) Discharge Instructions: Cover with ABD pad 1. I would recommend that we have the patient going continue to monitor for any signs of infection or worsening. Will get a try to get her on the schedule where she can come on Mondays as opposed to Fridays as this works better with her dialysis I think that is okay. We will see how things do next week and she is in agreement with the plan. 2. I am going to recommend as well that the patient should continue to utilize the gentamicin followed by the [facility] with a radial and ABD pad to cover. We will see patient back for reevaluation in 1 week here in the clinic. If anything worsens or changes patient will contact our office for additional recommendations. TYQUASIA, PANT A ([id number]) [id number] Page 19 of 20 Electronic Signature(s) Signed: 07/14/2023 9:42:45 AM By: [name] PA-C Entered By: [name] on 07/14/2023 09:42:45 -------------------------------------------------------------------------------- Total Contact Cast Details Patient Name: Date of Service: GERTURDE, KUBA 07/14/2023 8:30 A M Medical Record Number: [id number] Patient Account Number: [id number] Date of Birth/Sex: Treating RN: 03-Jul-1976 (47 y.o. Esmeralda Links Primary Care Provider:  [name] Other Clinician: [name] Referring Provider: Treating Provider/Extender: [name] Weeks in Treatment: 78 T Contact Cast Applied for Wound Assessment: otal Wound #12R Left,Medial,Plantar Foot Performed By: Physician [name], PA-C Post Procedure Diagnosis Same as Pre-procedure Electronic Signature(s) Signed: 07/14/2023 12:35:12 PM By: [name] PA-C Signed: 07/17/2023 5:11:42 PM By: [name] Entered By: [name] on 07/14/2023 09:16:42 -------------------------------------------------------------------------------- SuperBill Details Patient Name: Date of Service: [name] 07/14/2023 Medical Record Number: [id number] Patient Account Number: [id number] Date of Birth/Sex: Treating RN: 1976/01/08 (47 y.o. 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 We have been using Hydrofera Blue in the left heel Aquacel on the right heel. The left heel has been in a total contact cast 8/21; patient presents for follow-up. We have been using Hydrofera Blue to the left foot wound under the total contact cast. T the right heel patient has been o using Aquacel Ag. T the anterior leg wound antibiotic ointment. All wounds have healed. o 8/28; patient presents for follow-up. She has been using her surgical shoe with offloading felt pads. Her wounds have remained closed. 9/11; patient presents for final follow-up to assure that her wounds have remained closed. Unfortunately the left foot wound has opened again. But she has been wearing her Surgical shoe for offloading with felt pad. 07-14-2023 upon evaluation today patient appears to be doing well currently in regard to her wound. She has been tolerating the dressing changes without complication with total contact casting. I am going to be helping take care of her at this point as she is transferring from Dr. Mikey Bussing to me since Dr. Mikey Bussing is on maternity leave. Electronic Signature(s) Signed: 07/14/2023 9:41:51 AM By:  [name] PA-C Entered By: [name] on 07/14/2023 09:41:50 -------------------------------------------------------------------------------- Physical Exam Details Patient Name: Date of Service: CACI, ORREN 07/14/2023 8:30 A M Medical Record Number: [id number] Patient Account Number: [id number] Date of Birth/Sex: Treating RN: [birth year] (47 y.o. Esmeralda Links Primary Care Provider: [name] Other Clinician: [name] Referring Provider: Treating Provider/Extender: [name] Weeks in Treatment: [address] Aashka A ([id number]) [id number] Page 9 of 20 Constitutional Well-nourished and well-hydrated in no acute distress. Respiratory normal breathing without difficulty. Psychiatric this patient is able to make decisions and demonstrates good insight into disease process. Alert and Oriented x 3. pleasant and cooperative. Notes Upon inspection patient's wound bed actually showed signs of good granulation epithelization at this point. Fortunately I do not see any signs of worsening overall and the patient is doing quite well at this time. I do not see that she seems to be having any troubles with the cast and I think she is always done quite well with the cast to be honest. Electronic Signature(s) Signed: 07/14/2023 9:42:14 AM By: [name] PA-C Entered By: [name] on 07/14/2023 09:42:13 -------------------------------------------------------------------------------- Physician Orders Details Patient Name: Date of Service: [name] 07/14/2023 8:30 A M Medical Record Number: [id number] Patient Account Number: [id number] Date of Birth/Sex: Treating RN: Dec 28, 1975 (47 y.o. 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 Bathing/ Shower/ Hygiene May shower; gently cleanse wound with antibacterial soap, rinse and pat dry prior to dressing wounds No tub bath. Off-Loading Total Contact Cast to Left Lower Extremity Open toe surgical shoe - right foot Wound Treatment Wound #12R - Foot Wound Laterality: Plantar, Left, Medial Cleanser: Vashe 5.8 (oz) Discharge Instructions: Use vashe 5.8 (oz) as directed Topical: Gentamicin Discharge Instructions: Apply as directed by provider. Prim  right plantar heel and a small wound on the right anterior lower leg. We have been using Hydrofera Blue in the left heel Aquacel on the right heel. The left heel has been in a total contact cast 8/21; patient presents for follow-up. We have been using Hydrofera Blue to the left foot wound under the total contact cast. T the right heel patient has been o using Aquacel Ag. T the anterior leg wound antibiotic ointment. All wounds have healed. o 8/28; patient presents for follow-up. She has been using her surgical shoe with offloading felt pads. Her wounds have remained closed. 9/11; patient presents for final follow-up to assure that her wounds have remained closed. Unfortunately the left foot wound has opened again. But she has been wearing her Surgical shoe for offloading with felt pad. 07-14-2023 upon evaluation today patient appears to be doing well currently in regard to her wound. She has been tolerating the dressing changes without complication with total contact casting. I am going to be helping take care of her at this point as she is transferring from Dr. Mikey Bussing to me since Dr. Mikey Bussing is on maternity leave. Electronic Signature(s) Signed: 07/14/2023 9:41:51 AM By:  [name] PA-C Entered By: [name] on 07/14/2023 09:41:50 -------------------------------------------------------------------------------- Physical Exam Details Patient Name: Date of Service: CACI, ORREN 07/14/2023 8:30 A M Medical Record Number: [id number] Patient Account Number: [id number] Date of Birth/Sex: Treating RN: [birth year] (47 y.o. Esmeralda Links Primary Care Provider: [name] Other Clinician: [name] Referring Provider: Treating Provider/Extender: [name] Weeks in Treatment: [address] Aashka A ([id number]) [id number] Page 9 of 20 Constitutional Well-nourished and well-hydrated in no acute distress. Respiratory normal breathing without difficulty. Psychiatric this patient is able to make decisions and demonstrates good insight into disease process. Alert and Oriented x 3. pleasant and cooperative. Notes Upon inspection patient's wound bed actually showed signs of good granulation epithelization at this point. Fortunately I do not see any signs of worsening overall and the patient is doing quite well at this time. I do not see that she seems to be having any troubles with the cast and I think she is always done quite well with the cast to be honest. Electronic Signature(s) Signed: 07/14/2023 9:42:14 AM By: [name] PA-C Entered By: [name] on 07/14/2023 09:42:13 -------------------------------------------------------------------------------- Physician Orders Details Patient Name: Date of Service: [name] 07/14/2023 8:30 A M Medical Record Number: [id number] Patient Account Number: [id number] Date of Birth/Sex: Treating RN: Dec 28, 1975 (47 y.o. 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 Bathing/ Shower/ Hygiene May shower; gently cleanse wound with antibacterial soap, rinse and pat dry prior to dressing wounds No tub bath. Off-Loading Total Contact Cast to Left Lower Extremity Open toe surgical shoe - right foot Wound Treatment Wound #12R - Foot Wound Laterality: Plantar, Left, Medial Cleanser: Vashe 5.8 (oz) Discharge Instructions: Use vashe 5.8 (oz) as directed Topical: Gentamicin Discharge Instructions: Apply as directed by provider. Prim  Dressing: Hydrofera Blue Ready Transfer Foam, 2.5x2.5 (in/in) ary Discharge Instructions: Apply Hydrofera Blue Ready to wound bed as directed Secondary Dressing: ABD Pad 5x9 (in/in) Discharge Instructions: Cover with ABD pad Electronic Signature(s) Signed: 07/25/2023 4:58:15 PM By: [name] PA-C Signed: 07/27/2023 9:06:03 AM By: [name] Previous Signature: 07/14/2023 12:35:12 PM Version By: [name] PA-C Previous Signature: 07/17/2023 5:11:42 PM Version By: Sarita Haver, Ashyia A ([id number]) [id number] Page 10 of 20 Previous Signature: 07/17/2023 5:11:42 PM Version By: [name] Entered By: [name] on 07/25/2023 12:54:50 -------------------------------------------------------------------------------- Problem List Details Patient Name: Date of Service: [name] 07/14/2023 8:30 A M Medical Record Number: [id number] Patient Account Number: [id number] Date of Birth/Sex: Treating RN: 06-Jun-1976 (47 y.o. 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 03/16/2022 No Yes Z79.4 Long term (current) use of insulin 02/22/2023 No Yes N18.6 End stage renal disease 07/14/2023 No Yes Z99.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 07/14/2023 No Yes Inactive Problems Resolved Problems ICD-10 Code Description Active Date Resolved Date S81.801A Unspecified open wound, right lower leg, initial encounter 05/17/2023 05/17/2023 W54.0XXA Bitten by dog, initial encounter 05/17/2023 05/17/2023 L97.512 Non-pressure chronic ulcer of other part of right foot with fat layer exposed 03/16/2022 03/16/2022 L97.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 of skin 03/16/2022 03/16/2022 U27.253 Type 2 diabetes mellitus with other skin ulcer 05/17/2023 05/17/2023 Electronic Signature(s) Signed: 07/14/2023 9:07:46 AM By: [name] PA-C Previous Signature: 07/14/2023 8:42:17 AM Version By: [name] PA-C Entered By: [name] on 07/14/2023 09:07:46 Earmon Phoenix A ([id number]) [id number] Page 11 of 20 -------------------------------------------------------------------------------- Progress Note Details Patient Name: Date of Service: WILLADEAN, GUYTON 07/14/2023 8:30 A M Medical Record Number: [id number] Patient Account Number: [id number] Date of Birth/Sex: Treating RN: 1976/01/27 (47 y.o. Esmeralda Links Primary Care Provider: [name] Other Clinician: [name] Referring Provider: Treating Provider/Extender: [name] in Treatment: 50 Subjective Chief Complaint Information obtained from Patient Left foot ulcer History of Present Illness (HPI) 01/18/18-She is here for initial evaluation of the left great toe ulcer. She is a poor historian in regards to timeframe in detail. She states approximately 4 weeks ago she lacerated her toe on something in the house. She followed up with her primary care who placed her on Bactrim and ultimately a second dose of Bactrim prior to coming to wound clinic. She states she has been treating the toe with peroxide, Betadine and a Band-Aid. She did not check her blood sugar this morning but checked it yesterday morning it was 327; she is unaware of a recent A1c and there are no current records. She saw Dr. she would've orthopedics last week for an old injury to the left ankle, she states he did not see her toe, nor did she bring it to his attention. She smokes approximately 1 pack cigarettes a day. Her social situation is concerning, she arrives this morning with her mother who appears extremely intoxicated/under the influence; her mother was asked to leave the room and be monitored by the patient's grandmother. The patient's aunt then accompanied the patient and the room  Dressing: Hydrofera Blue Ready Transfer Foam, 2.5x2.5 (in/in) ary Discharge Instructions: Apply Hydrofera Blue Ready to wound bed as directed Secondary Dressing: ABD Pad 5x9 (in/in) Discharge Instructions: Cover with ABD pad Electronic Signature(s) Signed: 07/25/2023 4:58:15 PM By: [name] PA-C Signed: 07/27/2023 9:06:03 AM By: [name] Previous Signature: 07/14/2023 12:35:12 PM Version By: [name] PA-C Previous Signature: 07/17/2023 5:11:42 PM Version By: Sarita Haver, Ashyia A ([id number]) [id number] Page 10 of 20 Previous Signature: 07/17/2023 5:11:42 PM Version By: [name] Entered By: [name] on 07/25/2023 12:54:50 -------------------------------------------------------------------------------- Problem List Details Patient Name: Date of Service: [name] 07/14/2023 8:30 A M Medical Record Number: [id number] Patient Account Number: [id number] Date of Birth/Sex: Treating RN: 06-Jun-1976 (47 y.o. 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 03/16/2022 No Yes Z79.4 Long term (current) use of insulin 02/22/2023 No Yes N18.6 End stage renal disease 07/14/2023 No Yes Z99.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 07/14/2023 No Yes Inactive Problems Resolved Problems ICD-10 Code Description Active Date Resolved Date S81.801A Unspecified open wound, right lower leg, initial encounter 05/17/2023 05/17/2023 W54.0XXA Bitten by dog, initial encounter 05/17/2023 05/17/2023 L97.512 Non-pressure chronic ulcer of other part of right foot with fat layer exposed 03/16/2022 03/16/2022 L97.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 of skin 03/16/2022 03/16/2022 U27.253 Type 2 diabetes mellitus with other skin ulcer 05/17/2023 05/17/2023 Electronic Signature(s) Signed: 07/14/2023 9:07:46 AM By: [name] PA-C Previous Signature: 07/14/2023 8:42:17 AM Version By: [name] PA-C Entered By: [name] on 07/14/2023 09:07:46 Earmon Phoenix A ([id number]) [id number] Page 11 of 20 -------------------------------------------------------------------------------- Progress Note Details Patient Name: Date of Service: WILLADEAN, GUYTON 07/14/2023 8:30 A M Medical Record Number: [id number] Patient Account Number: [id number] Date of Birth/Sex: Treating RN: 1976/01/27 (47 y.o. Esmeralda Links Primary Care Provider: [name] Other Clinician: [name] Referring Provider: Treating Provider/Extender: [name] in Treatment: 50 Subjective Chief Complaint Information obtained from Patient Left foot ulcer History of Present Illness (HPI) 01/18/18-She is here for initial evaluation of the left great toe ulcer. She is a poor historian in regards to timeframe in detail. She states approximately 4 weeks ago she lacerated her toe on something in the house. She followed up with her primary care who placed her on Bactrim and ultimately a second dose of Bactrim prior to coming to wound clinic. She states she has been treating the toe with peroxide, Betadine and a Band-Aid. She did not check her blood sugar this morning but checked it yesterday morning it was 327; she is unaware of a recent A1c and there are no current records. She saw Dr. she would've orthopedics last week for an old injury to the left ankle, she states he did not see her toe, nor did she bring it to his attention. She smokes approximately 1 pack cigarettes a day. Her social situation is concerning, she arrives this morning with her mother who appears extremely intoxicated/under the influence; her mother was asked to leave the room and be monitored by the patient's grandmother. The patient's aunt then accompanied the patient and the room  Dressing: Hydrofera Blue Ready Transfer Foam, 2.5x2.5 (in/in) ary Discharge Instructions: Apply Hydrofera Blue Ready to wound bed as directed Secondary Dressing: ABD Pad 5x9 (in/in) Discharge Instructions: Cover with ABD pad Electronic Signature(s) Signed: 07/25/2023 4:58:15 PM By: [name] PA-C Signed: 07/27/2023 9:06:03 AM By: [name] Previous Signature: 07/14/2023 12:35:12 PM Version By: [name] PA-C Previous Signature: 07/17/2023 5:11:42 PM Version By: Sarita Haver, Ashyia A ([id number]) [id number] Page 10 of 20 Previous Signature: 07/17/2023 5:11:42 PM Version By: [name] Entered By: [name] on 07/25/2023 12:54:50 -------------------------------------------------------------------------------- Problem List Details Patient Name: Date of Service: [name] 07/14/2023 8:30 A M Medical Record Number: [id number] Patient Account Number: [id number] Date of Birth/Sex: Treating RN: 06-Jun-1976 (47 y.o. 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 03/16/2022 No Yes Z79.4 Long term (current) use of insulin 02/22/2023 No Yes N18.6 End stage renal disease 07/14/2023 No Yes Z99.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 07/14/2023 No Yes Inactive Problems Resolved Problems ICD-10 Code Description Active Date Resolved Date S81.801A Unspecified open wound, right lower leg, initial encounter 05/17/2023 05/17/2023 W54.0XXA Bitten by dog, initial encounter 05/17/2023 05/17/2023 L97.512 Non-pressure chronic ulcer of other part of right foot with fat layer exposed 03/16/2022 03/16/2022 L97.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 of skin 03/16/2022 03/16/2022 U27.253 Type 2 diabetes mellitus with other skin ulcer 05/17/2023 05/17/2023 Electronic Signature(s) Signed: 07/14/2023 9:07:46 AM By: [name] PA-C Previous Signature: 07/14/2023 8:42:17 AM Version By: [name] PA-C Entered By: [name] on 07/14/2023 09:07:46 Earmon Phoenix A ([id number]) [id number] Page 11 of 20 -------------------------------------------------------------------------------- Progress Note Details Patient Name: Date of Service: WILLADEAN, GUYTON 07/14/2023 8:30 A M Medical Record Number: [id number] Patient Account Number: [id number] Date of Birth/Sex: Treating RN: 1976/01/27 (47 y.o. Esmeralda Links Primary Care Provider: [name] Other Clinician: [name] Referring Provider: Treating Provider/Extender: [name] in Treatment: 50 Subjective Chief Complaint Information obtained from Patient Left foot ulcer History of Present Illness (HPI) 01/18/18-She is here for initial evaluation of the left great toe ulcer. She is a poor historian in regards to timeframe in detail. She states approximately 4 weeks ago she lacerated her toe on something in the house. She followed up with her primary care who placed her on Bactrim and ultimately a second dose of Bactrim prior to coming to wound clinic. She states she has been treating the toe with peroxide, Betadine and a Band-Aid. She did not check her blood sugar this morning but checked it yesterday morning it was 327; she is unaware of a recent A1c and there are no current records. She saw Dr. she would've orthopedics last week for an old injury to the left ankle, she states he did not see her toe, nor did she bring it to his attention. She smokes approximately 1 pack cigarettes a day. Her social situation is concerning, she arrives this morning with her mother who appears extremely intoxicated/under the influence; her mother was asked to leave the room and be monitored by the patient's grandmother. The patient's aunt then accompanied the patient and the room  right plantar heel and a small wound on the right anterior lower leg. We have been using Hydrofera Blue in the left heel Aquacel on the right heel. The left heel has been in a total contact cast 8/21; patient presents for follow-up. We have been using Hydrofera Blue to the left foot wound under the total contact cast. T the right heel patient has been o using Aquacel Ag. T the anterior leg wound antibiotic ointment. All wounds have healed. o 8/28; patient presents for follow-up. She has been using her surgical shoe with offloading felt pads. Her wounds have remained closed. 9/11; patient presents for final follow-up to assure that her wounds have remained closed. Unfortunately the left foot wound has opened again. But she has been wearing her Surgical shoe for offloading with felt pad. 07-14-2023 upon evaluation today patient appears to be doing well currently in regard to her wound. She has been tolerating the dressing changes without complication with total contact casting. I am going to be helping take care of her at this point as she is transferring from Dr. Mikey Bussing to me since Dr. Mikey Bussing is on maternity leave. Electronic Signature(s) Signed: 07/14/2023 9:41:51 AM By:  [name] PA-C Entered By: [name] on 07/14/2023 09:41:50 -------------------------------------------------------------------------------- Physical Exam Details Patient Name: Date of Service: CACI, ORREN 07/14/2023 8:30 A M Medical Record Number: [id number] Patient Account Number: [id number] Date of Birth/Sex: Treating RN: [birth year] (47 y.o. Esmeralda Links Primary Care Provider: [name] Other Clinician: [name] Referring Provider: Treating Provider/Extender: [name] Weeks in Treatment: [address] Aashka A ([id number]) [id number] Page 9 of 20 Constitutional Well-nourished and well-hydrated in no acute distress. Respiratory normal breathing without difficulty. Psychiatric this patient is able to make decisions and demonstrates good insight into disease process. Alert and Oriented x 3. pleasant and cooperative. Notes Upon inspection patient's wound bed actually showed signs of good granulation epithelization at this point. Fortunately I do not see any signs of worsening overall and the patient is doing quite well at this time. I do not see that she seems to be having any troubles with the cast and I think she is always done quite well with the cast to be honest. Electronic Signature(s) Signed: 07/14/2023 9:42:14 AM By: [name] PA-C Entered By: [name] on 07/14/2023 09:42:13 -------------------------------------------------------------------------------- Physician Orders Details Patient Name: Date of Service: [name] 07/14/2023 8:30 A M Medical Record Number: [id number] Patient Account Number: [id number] Date of Birth/Sex: Treating RN: Dec 28, 1975 (47 y.o. 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 Bathing/ Shower/ Hygiene May shower; gently cleanse wound with antibacterial soap, rinse and pat dry prior to dressing wounds No tub bath. Off-Loading Total Contact Cast to Left Lower Extremity Open toe surgical shoe - right foot Wound Treatment Wound #12R - Foot Wound Laterality: Plantar, Left, Medial Cleanser: Vashe 5.8 (oz) Discharge Instructions: Use vashe 5.8 (oz) as directed Topical: Gentamicin Discharge Instructions: Apply as directed by provider. Prim  MARCHELLA, HIBBARD A ([id number]) [id number] Page 1 of 20 Visit Report for 07/14/2023 Chief Complaint Document Details Patient Name: Date of Service: KRISTIN, BARCUS 07/14/2023 8:30 A M Medical Record Number: [id number] Patient Account Number: [id number] Date of Birth/Sex: Treating RN: Jun 16, 1976 (47 y.o. Esmeralda Links Primary Care Provider: [name] Other Clinician: [name] Referring Provider: Treating Provider/Extender: [name] Weeks in Treatment: 83 Information Obtained from: Patient Chief Complaint Left foot ulcer Electronic Signature(s) Signed: 07/14/2023 9:08:01 AM By: [name] PA-C Previous Signature: 07/14/2023 8:42:38 AM Version By: [name] PA-C Entered By: [name] on 07/14/2023 09:08:01 -------------------------------------------------------------------------------- Debridement Details Patient Name: Date of Service: [name] 07/14/2023 8:30 A M Medical Record Number: [id number] Patient Account Number: [id number] Date of Birth/Sex: Treating RN: 12-28-1975 (47 y.o. Esmeralda Links Primary Care Provider: [name] Other Clinician: [name] Referring Provider: Treating Provider/Extender: [name] in Treatment: 69 Debridement Performed for Assessment: Wound #12R Left,Medial,Plantar Foot Performed By: Physician [name], PA-C Debridement Type: Debridement Severity of Tissue Pre Debridement: Fat layer exposed Level of Consciousness (Pre-procedure): Awake and Alert Pre-procedure Verification/Time Out Yes - 09:11 Taken: Start Time: 09:11 Percent of Wound Bed Debrided: 100% T Area Debrided (cm): otal 0.16 Tissue and other material debrided: Viable, Non-Viable, Callus, Slough, Subcutaneous, Slough Level: Skin/Subcutaneous Tissue Debridement Description: Excisional Instrument: Curette Bleeding: Minimum Hemostasis Achieved: Pressure Response to  Treatment: Procedure was tolerated well Level of Consciousness (Post- Awake and Alert procedure): Post Debridement Measurements of Total Wound Length: (cm) 0.4 Width: (cm) 0.5 Depth: (cm) 0.2 Volume: (cm) 0.031 Character of Wound/Ulcer Post Debridement: Stable Severity of Tissue Post Debridement: Fat layer exposed Post Procedure Diagnosis Same as Pre-procedure Electronic Signature(s) Signed: 07/14/2023 12:10:54 PM By: Angelina Pih Signed: 07/14/2023 12:35:12 PM By: [name] PA-C Signed: 07/17/2023 5:11:42 PM By: [name] Entered By: [name] on 07/14/2023 09:16:10 Earmon Phoenix A ([id number]) [id number] Page 2 of 20 -------------------------------------------------------------------------------- HPI Details Patient Name: Date of Service: SHERRIE, MARSAN 07/14/2023 8:30 A M Medical Record Number: [id number] Patient Account Number: [id number] Date of Birth/Sex: Treating RN: 1976/09/05 (47 y.o. Esmeralda Links Primary Care Provider: [name] Other Clinician: [name] Referring Provider: Treating Provider/Extender: [name] in Treatment: 53 History of Present Illness HPI Description: 01/18/18-She is here for initial evaluation of the left great toe ulcer. She is a poor historian in regards to timeframe in detail. She states approximately 4 weeks ago she lacerated her toe on something in the house. She followed up with her primary care who placed her on Bactrim and ultimately a second dose of Bactrim prior to coming to wound clinic. She states she has been treating the toe with peroxide, Betadine and a Band-Aid. She did not check her blood sugar this morning but checked it yesterday morning it was 327; she is unaware of a recent A1c and there are no current records. She saw Dr. she would've orthopedics last week for an old injury to the left ankle, she states he did not see her toe, nor did she bring it  to his attention. She smokes approximately 1 pack cigarettes a day. Her social situation is concerning, she arrives this morning with her mother who appears extremely intoxicated/under the influence; her mother was asked to leave the room and be monitored by the patient's grandmother. The patient's aunt then accompanied the patient and the room throughout the rest of the appointment. We had a lengthy discussion regarding the  Dressing: Hydrofera Blue Ready Transfer Foam, 2.5x2.5 (in/in) ary Discharge Instructions: Apply Hydrofera Blue Ready to wound bed as directed Secondary Dressing: ABD Pad 5x9 (in/in) Discharge Instructions: Cover with ABD pad Electronic Signature(s) Signed: 07/25/2023 4:58:15 PM By: [name] PA-C Signed: 07/27/2023 9:06:03 AM By: [name] Previous Signature: 07/14/2023 12:35:12 PM Version By: [name] PA-C Previous Signature: 07/17/2023 5:11:42 PM Version By: Sarita Haver, Ashyia A ([id number]) [id number] Page 10 of 20 Previous Signature: 07/17/2023 5:11:42 PM Version By: [name] Entered By: [name] on 07/25/2023 12:54:50 -------------------------------------------------------------------------------- Problem List Details Patient Name: Date of Service: [name] 07/14/2023 8:30 A M Medical Record Number: [id number] Patient Account Number: [id number] Date of Birth/Sex: Treating RN: 06-Jun-1976 (47 y.o. 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 03/16/2022 No Yes Z79.4 Long term (current) use of insulin 02/22/2023 No Yes N18.6 End stage renal disease 07/14/2023 No Yes Z99.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 07/14/2023 No Yes Inactive Problems Resolved Problems ICD-10 Code Description Active Date Resolved Date S81.801A Unspecified open wound, right lower leg, initial encounter 05/17/2023 05/17/2023 W54.0XXA Bitten by dog, initial encounter 05/17/2023 05/17/2023 L97.512 Non-pressure chronic ulcer of other part of right foot with fat layer exposed 03/16/2022 03/16/2022 L97.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 of skin 03/16/2022 03/16/2022 U27.253 Type 2 diabetes mellitus with other skin ulcer 05/17/2023 05/17/2023 Electronic Signature(s) Signed: 07/14/2023 9:07:46 AM By: [name] PA-C Previous Signature: 07/14/2023 8:42:17 AM Version By: [name] PA-C Entered By: [name] on 07/14/2023 09:07:46 Earmon Phoenix A ([id number]) [id number] Page 11 of 20 -------------------------------------------------------------------------------- Progress Note Details Patient Name: Date of Service: WILLADEAN, GUYTON 07/14/2023 8:30 A M Medical Record Number: [id number] Patient Account Number: [id number] Date of Birth/Sex: Treating RN: 1976/01/27 (47 y.o. Esmeralda Links Primary Care Provider: [name] Other Clinician: [name] Referring Provider: Treating Provider/Extender: [name] in Treatment: 50 Subjective Chief Complaint Information obtained from Patient Left foot ulcer History of Present Illness (HPI) 01/18/18-She is here for initial evaluation of the left great toe ulcer. She is a poor historian in regards to timeframe in detail. She states approximately 4 weeks ago she lacerated her toe on something in the house. She followed up with her primary care who placed her on Bactrim and ultimately a second dose of Bactrim prior to coming to wound clinic. She states she has been treating the toe with peroxide, Betadine and a Band-Aid. She did not check her blood sugar this morning but checked it yesterday morning it was 327; she is unaware of a recent A1c and there are no current records. She saw Dr. she would've orthopedics last week for an old injury to the left ankle, she states he did not see her toe, nor did she bring it to his attention. She smokes approximately 1 pack cigarettes a day. Her social situation is concerning, she arrives this morning with her mother who appears extremely intoxicated/under the influence; her mother was asked to leave the room and be monitored by the patient's grandmother. The patient's aunt then accompanied the patient and the room  Wound/Ulcer Post Debridement is stable. Severity of Tissue Post Debridement is: Fat layer exposed. Post procedure Diagnosis Wound #12R: Same as Pre-Procedure Pre-procedure diagnosis of Wound #12R is a Diabetic Wound/Ulcer of the Lower Extremity located on the Left,Medial,Plantar Foot . There was a T Contact otal Cast Procedure by [name], PA-C. Post procedure Diagnosis Wound #12R: Same as Pre-Procedure Plan Follow-up Appointments: Return  Appointment in 2 weeks. Bathing/ Shower/ Hygiene: May shower; gently cleanse wound with antibacterial soap, rinse and pat dry prior to dressing wounds No tub bath. Off-Loading: T Contact Cast to Left Lower Extremity otal Open toe surgical shoe - right foot WOUND #12R: - Foot Wound Laterality: Plantar, Left, Medial Cleanser: Vashe 5.8 (oz) Discharge Instructions: Use vashe 5.8 (oz) as directed Topical: Gentamicin Discharge Instructions: Apply as directed by provider. Prim Dressing: Hydrofera Blue Ready Transfer Foam, 2.5x2.5 (in/in) ary Discharge Instructions: Apply Hydrofera Blue Ready to wound bed as directed Secondary Dressing: ABD Pad 5x9 (in/in) Discharge Instructions: Cover with ABD pad 1. I would recommend that we have the patient going continue to monitor for any signs of infection or worsening. Will get a try to get her on the schedule where she can come on Mondays as opposed to Fridays as this works better with her dialysis I think that is okay. We will see how things do next week and she is in agreement with the plan. 2. I am going to recommend as well that the patient should continue to utilize the gentamicin followed by the [facility] with a radial and ABD pad to cover. We will see patient back for reevaluation in 1 week here in the clinic. If anything worsens or changes patient will contact our office for additional recommendations. TYQUASIA, PANT A ([id number]) [id number] Page 19 of 20 Electronic Signature(s) Signed: 07/14/2023 9:42:45 AM By: [name] PA-C Entered By: [name] on 07/14/2023 09:42:45 -------------------------------------------------------------------------------- Total Contact Cast Details Patient Name: Date of Service: GERTURDE, KUBA 07/14/2023 8:30 A M Medical Record Number: [id number] Patient Account Number: [id number] Date of Birth/Sex: Treating RN: 03-Jul-1976 (47 y.o. Esmeralda Links Primary Care Provider:  [name] Other Clinician: [name] Referring Provider: Treating Provider/Extender: [name] Weeks in Treatment: 78 T Contact Cast Applied for Wound Assessment: otal Wound #12R Left,Medial,Plantar Foot Performed By: Physician [name], PA-C Post Procedure Diagnosis Same as Pre-procedure Electronic Signature(s) Signed: 07/14/2023 12:35:12 PM By: [name] PA-C Signed: 07/17/2023 5:11:42 PM By: [name] Entered By: [name] on 07/14/2023 09:16:42 -------------------------------------------------------------------------------- SuperBill Details Patient Name: Date of Service: [name] 07/14/2023 Medical Record Number: [id number] Patient Account Number: [id number] Date of Birth/Sex: Treating RN: 1976/01/08 (47 y.o. 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 We have been using Hydrofera Blue in the left heel Aquacel on the right heel. The left heel has been in a total contact cast 8/21; patient presents for follow-up. We have been using Hydrofera Blue to the left foot wound under the total contact cast. T the right heel patient has been o using Aquacel Ag. T the anterior leg wound antibiotic ointment. All wounds have healed. o 8/28; patient presents for follow-up. She has been using her surgical shoe with offloading felt pads. Her wounds have remained closed. 9/11; patient presents for final follow-up to assure that her wounds have remained closed. Unfortunately the left foot wound has opened again. But she has been wearing her Surgical shoe for offloading with felt pad. 07-14-2023 upon evaluation today patient appears to be doing well currently in regard to her wound. She has been tolerating the dressing changes without complication with total contact casting. I am going to be helping take care of her at this point as she is transferring from Dr. Mikey Bussing to me since Dr. Mikey Bussing is on maternity leave. Electronic Signature(s) Signed: 07/14/2023 9:41:51 AM By:  [name] PA-C Entered By: [name] on 07/14/2023 09:41:50 -------------------------------------------------------------------------------- Physical Exam Details Patient Name: Date of Service: CACI, ORREN 07/14/2023 8:30 A M Medical Record Number: [id number] Patient Account Number: [id number] Date of Birth/Sex: Treating RN: [birth year] (47 y.o. Esmeralda Links Primary Care Provider: [name] Other Clinician: [name] Referring Provider: Treating Provider/Extender: [name] Weeks in Treatment: [address] Aashka A ([id number]) [id number] Page 9 of 20 Constitutional Well-nourished and well-hydrated in no acute distress. Respiratory normal breathing without difficulty. Psychiatric this patient is able to make decisions and demonstrates good insight into disease process. Alert and Oriented x 3. pleasant and cooperative. Notes Upon inspection patient's wound bed actually showed signs of good granulation epithelization at this point. Fortunately I do not see any signs of worsening overall and the patient is doing quite well at this time. I do not see that she seems to be having any troubles with the cast and I think she is always done quite well with the cast to be honest. Electronic Signature(s) Signed: 07/14/2023 9:42:14 AM By: [name] PA-C Entered By: [name] on 07/14/2023 09:42:13 -------------------------------------------------------------------------------- Physician Orders Details Patient Name: Date of Service: [name] 07/14/2023 8:30 A M Medical Record Number: [id number] Patient Account Number: [id number] Date of Birth/Sex: Treating RN: Dec 28, 1975 (47 y.o. 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 Bathing/ Shower/ Hygiene May shower; gently cleanse wound with antibacterial soap, rinse and pat dry prior to dressing wounds No tub bath. Off-Loading Total Contact Cast to Left Lower Extremity Open toe surgical shoe - right foot Wound Treatment Wound #12R - Foot Wound Laterality: Plantar, Left, Medial Cleanser: Vashe 5.8 (oz) Discharge Instructions: Use vashe 5.8 (oz) as directed Topical: Gentamicin Discharge Instructions: Apply as directed by provider. Prim  Dressing: Hydrofera Blue Ready Transfer Foam, 2.5x2.5 (in/in) ary Discharge Instructions: Apply Hydrofera Blue Ready to wound bed as directed Secondary Dressing: ABD Pad 5x9 (in/in) Discharge Instructions: Cover with ABD pad Electronic Signature(s) Signed: 07/25/2023 4:58:15 PM By: [name] PA-C Signed: 07/27/2023 9:06:03 AM By: [name] Previous Signature: 07/14/2023 12:35:12 PM Version By: [name] PA-C Previous Signature: 07/17/2023 5:11:42 PM Version By: Sarita Haver, Ashyia A ([id number]) [id number] Page 10 of 20 Previous Signature: 07/17/2023 5:11:42 PM Version By: [name] Entered By: [name] on 07/25/2023 12:54:50 -------------------------------------------------------------------------------- Problem List Details Patient Name: Date of Service: [name] 07/14/2023 8:30 A M Medical Record Number: [id number] Patient Account Number: [id number] Date of Birth/Sex: Treating RN: 06-Jun-1976 (47 y.o. 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 03/16/2022 No Yes Z79.4 Long term (current) use of insulin 02/22/2023 No Yes N18.6 End stage renal disease 07/14/2023 No Yes Z99.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 07/14/2023 No Yes Inactive Problems Resolved Problems ICD-10 Code Description Active Date Resolved Date S81.801A Unspecified open wound, right lower leg, initial encounter 05/17/2023 05/17/2023 W54.0XXA Bitten by dog, initial encounter 05/17/2023 05/17/2023 L97.512 Non-pressure chronic ulcer of other part of right foot with fat layer exposed 03/16/2022 03/16/2022 L97.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 of skin 03/16/2022 03/16/2022 U27.253 Type 2 diabetes mellitus with other skin ulcer 05/17/2023 05/17/2023 Electronic Signature(s) Signed: 07/14/2023 9:07:46 AM By: [name] PA-C Previous Signature: 07/14/2023 8:42:17 AM Version By: [name] PA-C Entered By: [name] on 07/14/2023 09:07:46 Earmon Phoenix A ([id number]) [id number] Page 11 of 20 -------------------------------------------------------------------------------- Progress Note Details Patient Name: Date of Service: WILLADEAN, GUYTON 07/14/2023 8:30 A M Medical Record Number: [id number] Patient Account Number: [id number] Date of Birth/Sex: Treating RN: 1976/01/27 (47 y.o. Esmeralda Links Primary Care Provider: [name] Other Clinician: [name] Referring Provider: Treating Provider/Extender: [name] in Treatment: 50 Subjective Chief Complaint Information obtained from Patient Left foot ulcer History of Present Illness (HPI) 01/18/18-She is here for initial evaluation of the left great toe ulcer. She is a poor historian in regards to timeframe in detail. She states approximately 4 weeks ago she lacerated her toe on something in the house. She followed up with her primary care who placed her on Bactrim and ultimately a second dose of Bactrim prior to coming to wound clinic. She states she has been treating the toe with peroxide, Betadine and a Band-Aid. She did not check her blood sugar this morning but checked it yesterday morning it was 327; she is unaware of a recent A1c and there are no current records. She saw Dr. she would've orthopedics last week for an old injury to the left ankle, she states he did not see her toe, nor did she bring it to his attention. She smokes approximately 1 pack cigarettes a day. Her social situation is concerning, she arrives this morning with her mother who appears extremely intoxicated/under the influence; her mother was asked to leave the room and be monitored by the patient's grandmother. The patient's aunt then accompanied the patient and the room  MARCHELLA, HIBBARD A ([id number]) [id number] Page 1 of 20 Visit Report for 07/14/2023 Chief Complaint Document Details Patient Name: Date of Service: KRISTIN, BARCUS 07/14/2023 8:30 A M Medical Record Number: [id number] Patient Account Number: [id number] Date of Birth/Sex: Treating RN: Jun 16, 1976 (47 y.o. Esmeralda Links Primary Care Provider: [name] Other Clinician: [name] Referring Provider: Treating Provider/Extender: [name] Weeks in Treatment: 83 Information Obtained from: Patient Chief Complaint Left foot ulcer Electronic Signature(s) Signed: 07/14/2023 9:08:01 AM By: [name] PA-C Previous Signature: 07/14/2023 8:42:38 AM Version By: [name] PA-C Entered By: [name] on 07/14/2023 09:08:01 -------------------------------------------------------------------------------- Debridement Details Patient Name: Date of Service: [name] 07/14/2023 8:30 A M Medical Record Number: [id number] Patient Account Number: [id number] Date of Birth/Sex: Treating RN: 12-28-1975 (47 y.o. Esmeralda Links Primary Care Provider: [name] Other Clinician: [name] Referring Provider: Treating Provider/Extender: [name] in Treatment: 69 Debridement Performed for Assessment: Wound #12R Left,Medial,Plantar Foot Performed By: Physician [name], PA-C Debridement Type: Debridement Severity of Tissue Pre Debridement: Fat layer exposed Level of Consciousness (Pre-procedure): Awake and Alert Pre-procedure Verification/Time Out Yes - 09:11 Taken: Start Time: 09:11 Percent of Wound Bed Debrided: 100% T Area Debrided (cm): otal 0.16 Tissue and other material debrided: Viable, Non-Viable, Callus, Slough, Subcutaneous, Slough Level: Skin/Subcutaneous Tissue Debridement Description: Excisional Instrument: Curette Bleeding: Minimum Hemostasis Achieved: Pressure Response to  Treatment: Procedure was tolerated well Level of Consciousness (Post- Awake and Alert procedure): Post Debridement Measurements of Total Wound Length: (cm) 0.4 Width: (cm) 0.5 Depth: (cm) 0.2 Volume: (cm) 0.031 Character of Wound/Ulcer Post Debridement: Stable Severity of Tissue Post Debridement: Fat layer exposed Post Procedure Diagnosis Same as Pre-procedure Electronic Signature(s) Signed: 07/14/2023 12:10:54 PM By: Angelina Pih Signed: 07/14/2023 12:35:12 PM By: [name] PA-C Signed: 07/17/2023 5:11:42 PM By: [name] Entered By: [name] on 07/14/2023 09:16:10 Earmon Phoenix A ([id number]) [id number] Page 2 of 20 -------------------------------------------------------------------------------- HPI Details Patient Name: Date of Service: SHERRIE, MARSAN 07/14/2023 8:30 A M Medical Record Number: [id number] Patient Account Number: [id number] Date of Birth/Sex: Treating RN: 1976/09/05 (47 y.o. Esmeralda Links Primary Care Provider: [name] Other Clinician: [name] Referring Provider: Treating Provider/Extender: [name] in Treatment: 53 History of Present Illness HPI Description: 01/18/18-She is here for initial evaluation of the left great toe ulcer. She is a poor historian in regards to timeframe in detail. She states approximately 4 weeks ago she lacerated her toe on something in the house. She followed up with her primary care who placed her on Bactrim and ultimately a second dose of Bactrim prior to coming to wound clinic. She states she has been treating the toe with peroxide, Betadine and a Band-Aid. She did not check her blood sugar this morning but checked it yesterday morning it was 327; she is unaware of a recent A1c and there are no current records. She saw Dr. she would've orthopedics last week for an old injury to the left ankle, she states he did not see her toe, nor did she bring it  to his attention. She smokes approximately 1 pack cigarettes a day. Her social situation is concerning, she arrives this morning with her mother who appears extremely intoxicated/under the influence; her mother was asked to leave the room and be monitored by the patient's grandmother. The patient's aunt then accompanied the patient and the room throughout the rest of the appointment. We had a lengthy discussion regarding the  MARCHELLA, HIBBARD A ([id number]) [id number] Page 1 of 20 Visit Report for 07/14/2023 Chief Complaint Document Details Patient Name: Date of Service: KRISTIN, BARCUS 07/14/2023 8:30 A M Medical Record Number: [id number] Patient Account Number: [id number] Date of Birth/Sex: Treating RN: Jun 16, 1976 (47 y.o. Esmeralda Links Primary Care Provider: [name] Other Clinician: [name] Referring Provider: Treating Provider/Extender: [name] Weeks in Treatment: 83 Information Obtained from: Patient Chief Complaint Left foot ulcer Electronic Signature(s) Signed: 07/14/2023 9:08:01 AM By: [name] PA-C Previous Signature: 07/14/2023 8:42:38 AM Version By: [name] PA-C Entered By: [name] on 07/14/2023 09:08:01 -------------------------------------------------------------------------------- Debridement Details Patient Name: Date of Service: [name] 07/14/2023 8:30 A M Medical Record Number: [id number] Patient Account Number: [id number] Date of Birth/Sex: Treating RN: 12-28-1975 (47 y.o. Esmeralda Links Primary Care Provider: [name] Other Clinician: [name] Referring Provider: Treating Provider/Extender: [name] in Treatment: 69 Debridement Performed for Assessment: Wound #12R Left,Medial,Plantar Foot Performed By: Physician [name], PA-C Debridement Type: Debridement Severity of Tissue Pre Debridement: Fat layer exposed Level of Consciousness (Pre-procedure): Awake and Alert Pre-procedure Verification/Time Out Yes - 09:11 Taken: Start Time: 09:11 Percent of Wound Bed Debrided: 100% T Area Debrided (cm): otal 0.16 Tissue and other material debrided: Viable, Non-Viable, Callus, Slough, Subcutaneous, Slough Level: Skin/Subcutaneous Tissue Debridement Description: Excisional Instrument: Curette Bleeding: Minimum Hemostasis Achieved: Pressure Response to  Treatment: Procedure was tolerated well Level of Consciousness (Post- Awake and Alert procedure): Post Debridement Measurements of Total Wound Length: (cm) 0.4 Width: (cm) 0.5 Depth: (cm) 0.2 Volume: (cm) 0.031 Character of Wound/Ulcer Post Debridement: Stable Severity of Tissue Post Debridement: Fat layer exposed Post Procedure Diagnosis Same as Pre-procedure Electronic Signature(s) Signed: 07/14/2023 12:10:54 PM By: Angelina Pih Signed: 07/14/2023 12:35:12 PM By: [name] PA-C Signed: 07/17/2023 5:11:42 PM By: [name] Entered By: [name] on 07/14/2023 09:16:10 Earmon Phoenix A ([id number]) [id number] Page 2 of 20 -------------------------------------------------------------------------------- HPI Details Patient Name: Date of Service: SHERRIE, MARSAN 07/14/2023 8:30 A M Medical Record Number: [id number] Patient Account Number: [id number] Date of Birth/Sex: Treating RN: 1976/09/05 (47 y.o. Esmeralda Links Primary Care Provider: [name] Other Clinician: [name] Referring Provider: Treating Provider/Extender: [name] in Treatment: 53 History of Present Illness HPI Description: 01/18/18-She is here for initial evaluation of the left great toe ulcer. She is a poor historian in regards to timeframe in detail. She states approximately 4 weeks ago she lacerated her toe on something in the house. She followed up with her primary care who placed her on Bactrim and ultimately a second dose of Bactrim prior to coming to wound clinic. She states she has been treating the toe with peroxide, Betadine and a Band-Aid. She did not check her blood sugar this morning but checked it yesterday morning it was 327; she is unaware of a recent A1c and there are no current records. She saw Dr. she would've orthopedics last week for an old injury to the left ankle, she states he did not see her toe, nor did she bring it  to his attention. She smokes approximately 1 pack cigarettes a day. Her social situation is concerning, she arrives this morning with her mother who appears extremely intoxicated/under the influence; her mother was asked to leave the room and be monitored by the patient's grandmother. The patient's aunt then accompanied the patient and the room throughout the rest of the appointment. We had a lengthy discussion regarding the  Wound/Ulcer Post Debridement is stable. Severity of Tissue Post Debridement is: Fat layer exposed. Post procedure Diagnosis Wound #12R: Same as Pre-Procedure Pre-procedure diagnosis of Wound #12R is a Diabetic Wound/Ulcer of the Lower Extremity located on the Left,Medial,Plantar Foot . There was a T Contact otal Cast Procedure by [name], PA-C. Post procedure Diagnosis Wound #12R: Same as Pre-Procedure Plan Follow-up Appointments: Return  Appointment in 2 weeks. Bathing/ Shower/ Hygiene: May shower; gently cleanse wound with antibacterial soap, rinse and pat dry prior to dressing wounds No tub bath. Off-Loading: T Contact Cast to Left Lower Extremity otal Open toe surgical shoe - right foot WOUND #12R: - Foot Wound Laterality: Plantar, Left, Medial Cleanser: Vashe 5.8 (oz) Discharge Instructions: Use vashe 5.8 (oz) as directed Topical: Gentamicin Discharge Instructions: Apply as directed by provider. Prim Dressing: Hydrofera Blue Ready Transfer Foam, 2.5x2.5 (in/in) ary Discharge Instructions: Apply Hydrofera Blue Ready to wound bed as directed Secondary Dressing: ABD Pad 5x9 (in/in) Discharge Instructions: Cover with ABD pad 1. I would recommend that we have the patient going continue to monitor for any signs of infection or worsening. Will get a try to get her on the schedule where she can come on Mondays as opposed to Fridays as this works better with her dialysis I think that is okay. We will see how things do next week and she is in agreement with the plan. 2. I am going to recommend as well that the patient should continue to utilize the gentamicin followed by the [facility] with a radial and ABD pad to cover. We will see patient back for reevaluation in 1 week here in the clinic. If anything worsens or changes patient will contact our office for additional recommendations. TYQUASIA, PANT A ([id number]) [id number] Page 19 of 20 Electronic Signature(s) Signed: 07/14/2023 9:42:45 AM By: [name] PA-C Entered By: [name] on 07/14/2023 09:42:45 -------------------------------------------------------------------------------- Total Contact Cast Details Patient Name: Date of Service: GERTURDE, KUBA 07/14/2023 8:30 A M Medical Record Number: [id number] Patient Account Number: [id number] Date of Birth/Sex: Treating RN: 03-Jul-1976 (47 y.o. Esmeralda Links Primary Care Provider:  [name] Other Clinician: [name] Referring Provider: Treating Provider/Extender: [name] Weeks in Treatment: 78 T Contact Cast Applied for Wound Assessment: otal Wound #12R Left,Medial,Plantar Foot Performed By: Physician [name], PA-C Post Procedure Diagnosis Same as Pre-procedure Electronic Signature(s) Signed: 07/14/2023 12:35:12 PM By: [name] PA-C Signed: 07/17/2023 5:11:42 PM By: [name] Entered By: [name] on 07/14/2023 09:16:42 -------------------------------------------------------------------------------- SuperBill Details Patient Name: Date of Service: [name] 07/14/2023 Medical Record Number: [id number] Patient Account Number: [id number] Date of Birth/Sex: Treating RN: 1976/01/08 (47 y.o. 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 Severity of Tissue Post Debridement is: Fat layer exposed. Post procedure Diagnosis Wound #12R: Same as Pre-Procedure Pre-procedure diagnosis of Wound #12R is a Diabetic Wound/Ulcer of the Lower Extremity located on the Left,Medial,Plantar Foot . There was a T Contact otal Cast Procedure by [name], PA-C. Post procedure Diagnosis Wound #12R: Same as Pre-Procedure Plan Follow-up Appointments: Return  Appointment in 2 weeks. Bathing/ Shower/ Hygiene: May shower; gently cleanse wound with antibacterial soap, rinse and pat dry prior to dressing wounds No tub bath. Off-Loading: T Contact Cast to Left Lower Extremity otal Open toe surgical shoe - right foot WOUND #12R: - Foot Wound Laterality: Plantar, Left, Medial Cleanser: Vashe 5.8 (oz) Discharge Instructions: Use vashe 5.8 (oz) as directed Topical: Gentamicin Discharge Instructions: Apply as directed by provider. Prim Dressing: Hydrofera Blue Ready Transfer Foam, 2.5x2.5 (in/in) ary Discharge Instructions: Apply Hydrofera Blue Ready to wound bed as directed Secondary Dressing: ABD Pad 5x9 (in/in) Discharge Instructions: Cover with ABD pad 1. I would recommend that we have the patient going continue to monitor for any signs of infection or worsening. Will get a try to get her on the schedule where she can come on Mondays as opposed to Fridays as this works better with her dialysis I think that is okay. We will see how things do next week and she is in agreement with the plan. 2. I am going to recommend as well that the patient should continue to utilize the gentamicin followed by the [facility] with a radial and ABD pad to cover. We will see patient back for reevaluation in 1 week here in the clinic. If anything worsens or changes patient will contact our office for additional recommendations. TYQUASIA, PANT A ([id number]) [id number] Page 19 of 20 Electronic Signature(s) Signed: 07/14/2023 9:42:45 AM By: [name] PA-C Entered By: [name] on 07/14/2023 09:42:45 -------------------------------------------------------------------------------- Total Contact Cast Details Patient Name: Date of Service: GERTURDE, KUBA 07/14/2023 8:30 A M Medical Record Number: [id number] Patient Account Number: [id number] Date of Birth/Sex: Treating RN: 03-Jul-1976 (47 y.o. Esmeralda Links Primary Care Provider:  [name] Other Clinician: [name] Referring Provider: Treating Provider/Extender: [name] Weeks in Treatment: 78 T Contact Cast Applied for Wound Assessment: otal Wound #12R Left,Medial,Plantar Foot Performed By: Physician [name], PA-C Post Procedure Diagnosis Same as Pre-procedure Electronic Signature(s) Signed: 07/14/2023 12:35:12 PM By: [name] PA-C Signed: 07/17/2023 5:11:42 PM By: [name] Entered By: [name] on 07/14/2023 09:16:42 -------------------------------------------------------------------------------- SuperBill Details Patient Name: Date of Service: [name] 07/14/2023 Medical Record Number: [id number] Patient Account Number: [id number] Date of Birth/Sex: Treating RN: 1976/01/08 (47 y.o. 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 We have been using Hydrofera Blue in the left heel Aquacel on the right heel. The left heel has been in a total contact cast 8/21; patient presents for follow-up. We have been using Hydrofera Blue to the left foot wound under the total contact cast. T the right heel patient has been o using Aquacel Ag. T the anterior leg wound antibiotic ointment. All wounds have healed. o 8/28; patient presents for follow-up. She has been using her surgical shoe with offloading felt pads. Her wounds have remained closed. 9/11; patient presents for final follow-up to assure that her wounds have remained closed. Unfortunately the left foot wound has opened again. But she has been wearing her Surgical shoe for offloading with felt pad. 07-14-2023 upon evaluation today patient appears to be doing well currently in regard to her wound. She has been tolerating the dressing changes without complication with total contact casting. I am going to be helping take care of her at this point as she is transferring from Dr. Mikey Bussing to me since Dr. Mikey Bussing is on maternity leave. Electronic Signature(s) Signed: 07/14/2023 9:41:51 AM By:  [name] PA-C Entered By: [name] on 07/14/2023 09:41:50 -------------------------------------------------------------------------------- Physical Exam Details Patient Name: Date of Service: CACI, ORREN 07/14/2023 8:30 A M Medical Record Number: [id number] Patient Account Number: [id number] Date of Birth/Sex: Treating RN: [birth year] (47 y.o. Esmeralda Links Primary Care Provider: [name] Other Clinician: [name] Referring Provider: Treating Provider/Extender: [name] Weeks in Treatment: [address] Aashka A ([id number]) [id number] Page 9 of 20 Constitutional Well-nourished and well-hydrated in no acute distress. Respiratory normal breathing without difficulty. Psychiatric this patient is able to make decisions and demonstrates good insight into disease process. Alert and Oriented x 3. pleasant and cooperative. Notes Upon inspection patient's wound bed actually showed signs of good granulation epithelization at this point. Fortunately I do not see any signs of worsening overall and the patient is doing quite well at this time. I do not see that she seems to be having any troubles with the cast and I think she is always done quite well with the cast to be honest. Electronic Signature(s) Signed: 07/14/2023 9:42:14 AM By: [name] PA-C Entered By: [name] on 07/14/2023 09:42:13 -------------------------------------------------------------------------------- Physician Orders Details Patient Name: Date of Service: [name] 07/14/2023 8:30 A M Medical Record Number: [id number] Patient Account Number: [id number] Date of Birth/Sex: Treating RN: Dec 28, 1975 (47 y.o. 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 Bathing/ Shower/ Hygiene May shower; gently cleanse wound with antibacterial soap, rinse and pat dry prior to dressing wounds No tub bath. Off-Loading Total Contact Cast to Left Lower Extremity Open toe surgical shoe - right foot Wound Treatment Wound #12R - Foot Wound Laterality: Plantar, Left, Medial Cleanser: Vashe 5.8 (oz) Discharge Instructions: Use vashe 5.8 (oz) as directed Topical: Gentamicin Discharge Instructions: Apply as directed by provider. Prim  MARCHELLA, HIBBARD A ([id number]) [id number] Page 1 of 20 Visit Report for 07/14/2023 Chief Complaint Document Details Patient Name: Date of Service: KRISTIN, BARCUS 07/14/2023 8:30 A M Medical Record Number: [id number] Patient Account Number: [id number] Date of Birth/Sex: Treating RN: Jun 16, 1976 (47 y.o. Esmeralda Links Primary Care Provider: [name] Other Clinician: [name] Referring Provider: Treating Provider/Extender: [name] Weeks in Treatment: 83 Information Obtained from: Patient Chief Complaint Left foot ulcer Electronic Signature(s) Signed: 07/14/2023 9:08:01 AM By: [name] PA-C Previous Signature: 07/14/2023 8:42:38 AM Version By: [name] PA-C Entered By: [name] on 07/14/2023 09:08:01 -------------------------------------------------------------------------------- Debridement Details Patient Name: Date of Service: [name] 07/14/2023 8:30 A M Medical Record Number: [id number] Patient Account Number: [id number] Date of Birth/Sex: Treating RN: 12-28-1975 (47 y.o. Esmeralda Links Primary Care Provider: [name] Other Clinician: [name] Referring Provider: Treating Provider/Extender: [name] in Treatment: 69 Debridement Performed for Assessment: Wound #12R Left,Medial,Plantar Foot Performed By: Physician [name], PA-C Debridement Type: Debridement Severity of Tissue Pre Debridement: Fat layer exposed Level of Consciousness (Pre-procedure): Awake and Alert Pre-procedure Verification/Time Out Yes - 09:11 Taken: Start Time: 09:11 Percent of Wound Bed Debrided: 100% T Area Debrided (cm): otal 0.16 Tissue and other material debrided: Viable, Non-Viable, Callus, Slough, Subcutaneous, Slough Level: Skin/Subcutaneous Tissue Debridement Description: Excisional Instrument: Curette Bleeding: Minimum Hemostasis Achieved: Pressure Response to  Treatment: Procedure was tolerated well Level of Consciousness (Post- Awake and Alert procedure): Post Debridement Measurements of Total Wound Length: (cm) 0.4 Width: (cm) 0.5 Depth: (cm) 0.2 Volume: (cm) 0.031 Character of Wound/Ulcer Post Debridement: Stable Severity of Tissue Post Debridement: Fat layer exposed Post Procedure Diagnosis Same as Pre-procedure Electronic Signature(s) Signed: 07/14/2023 12:10:54 PM By: Angelina Pih Signed: 07/14/2023 12:35:12 PM By: [name] PA-C Signed: 07/17/2023 5:11:42 PM By: [name] Entered By: [name] on 07/14/2023 09:16:10 Earmon Phoenix A ([id number]) [id number] Page 2 of 20 -------------------------------------------------------------------------------- HPI Details Patient Name: Date of Service: SHERRIE, MARSAN 07/14/2023 8:30 A M Medical Record Number: [id number] Patient Account Number: [id number] Date of Birth/Sex: Treating RN: 1976/09/05 (47 y.o. Esmeralda Links Primary Care Provider: [name] Other Clinician: [name] Referring Provider: Treating Provider/Extender: [name] in Treatment: 53 History of Present Illness HPI Description: 01/18/18-She is here for initial evaluation of the left great toe ulcer. She is a poor historian in regards to timeframe in detail. She states approximately 4 weeks ago she lacerated her toe on something in the house. She followed up with her primary care who placed her on Bactrim and ultimately a second dose of Bactrim prior to coming to wound clinic. She states she has been treating the toe with peroxide, Betadine and a Band-Aid. She did not check her blood sugar this morning but checked it yesterday morning it was 327; she is unaware of a recent A1c and there are no current records. She saw Dr. she would've orthopedics last week for an old injury to the left ankle, she states he did not see her toe, nor did she bring it  to his attention. She smokes approximately 1 pack cigarettes a day. Her social situation is concerning, she arrives this morning with her mother who appears extremely intoxicated/under the influence; her mother was asked to leave the room and be monitored by the patient's grandmother. The patient's aunt then accompanied the patient and the room throughout the rest of the appointment. We had a lengthy discussion regarding the  right plantar heel and a small wound on the right anterior lower leg. We have been using Hydrofera Blue in the left heel Aquacel on the right heel. The left heel has been in a total contact cast 8/21; patient presents for follow-up. We have been using Hydrofera Blue to the left foot wound under the total contact cast. T the right heel patient has been o using Aquacel Ag. T the anterior leg wound antibiotic ointment. All wounds have healed. o 8/28; patient presents for follow-up. She has been using her surgical shoe with offloading felt pads. Her wounds have remained closed. 9/11; patient presents for final follow-up to assure that her wounds have remained closed. Unfortunately the left foot wound has opened again. But she has been wearing her Surgical shoe for offloading with felt pad. 07-14-2023 upon evaluation today patient appears to be doing well currently in regard to her wound. She has been tolerating the dressing changes without complication with total contact casting. I am going to be helping take care of her at this point as she is transferring from Dr. Mikey Bussing to me since Dr. Mikey Bussing is on maternity leave. Electronic Signature(s) Signed: 07/14/2023 9:41:51 AM By:  [name] PA-C Entered By: [name] on 07/14/2023 09:41:50 -------------------------------------------------------------------------------- Physical Exam Details Patient Name: Date of Service: CACI, ORREN 07/14/2023 8:30 A M Medical Record Number: [id number] Patient Account Number: [id number] Date of Birth/Sex: Treating RN: [birth year] (47 y.o. Esmeralda Links Primary Care Provider: [name] Other Clinician: [name] Referring Provider: Treating Provider/Extender: [name] Weeks in Treatment: [address] Aashka A ([id number]) [id number] Page 9 of 20 Constitutional Well-nourished and well-hydrated in no acute distress. Respiratory normal breathing without difficulty. Psychiatric this patient is able to make decisions and demonstrates good insight into disease process. Alert and Oriented x 3. pleasant and cooperative. Notes Upon inspection patient's wound bed actually showed signs of good granulation epithelization at this point. Fortunately I do not see any signs of worsening overall and the patient is doing quite well at this time. I do not see that she seems to be having any troubles with the cast and I think she is always done quite well with the cast to be honest. Electronic Signature(s) Signed: 07/14/2023 9:42:14 AM By: [name] PA-C Entered By: [name] on 07/14/2023 09:42:13 -------------------------------------------------------------------------------- Physician Orders Details Patient Name: Date of Service: [name] 07/14/2023 8:30 A M Medical Record Number: [id number] Patient Account Number: [id number] Date of Birth/Sex: Treating RN: Dec 28, 1975 (47 y.o. 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 Bathing/ Shower/ Hygiene May shower; gently cleanse wound with antibacterial soap, rinse and pat dry prior to dressing wounds No tub bath. Off-Loading Total Contact Cast to Left Lower Extremity Open toe surgical shoe - right foot Wound Treatment Wound #12R - Foot Wound Laterality: Plantar, Left, Medial Cleanser: Vashe 5.8 (oz) Discharge Instructions: Use vashe 5.8 (oz) as directed Topical: Gentamicin Discharge Instructions: Apply as directed by provider. Prim  Dressing: Hydrofera Blue Ready Transfer Foam, 2.5x2.5 (in/in) ary Discharge Instructions: Apply Hydrofera Blue Ready to wound bed as directed Secondary Dressing: ABD Pad 5x9 (in/in) Discharge Instructions: Cover with ABD pad Electronic Signature(s) Signed: 07/25/2023 4:58:15 PM By: [name] PA-C Signed: 07/27/2023 9:06:03 AM By: [name] Previous Signature: 07/14/2023 12:35:12 PM Version By: [name] PA-C Previous Signature: 07/17/2023 5:11:42 PM Version By: Sarita Haver, Ashyia A ([id number]) [id number] Page 10 of 20 Previous Signature: 07/17/2023 5:11:42 PM Version By: [name] Entered By: [name] on 07/25/2023 12:54:50 -------------------------------------------------------------------------------- Problem List Details Patient Name: Date of Service: [name] 07/14/2023 8:30 A M Medical Record Number: [id number] Patient Account Number: [id number] Date of Birth/Sex: Treating RN: 06-Jun-1976 (47 y.o. 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 03/16/2022 No Yes Z79.4 Long term (current) use of insulin 02/22/2023 No Yes N18.6 End stage renal disease 07/14/2023 No Yes Z99.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 07/14/2023 No Yes Inactive Problems Resolved Problems ICD-10 Code Description Active Date Resolved Date S81.801A Unspecified open wound, right lower leg, initial encounter 05/17/2023 05/17/2023 W54.0XXA Bitten by dog, initial encounter 05/17/2023 05/17/2023 L97.512 Non-pressure chronic ulcer of other part of right foot with fat layer exposed 03/16/2022 03/16/2022 L97.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 of skin 03/16/2022 03/16/2022 U27.253 Type 2 diabetes mellitus with other skin ulcer 05/17/2023 05/17/2023 Electronic Signature(s) Signed: 07/14/2023 9:07:46 AM By: [name] PA-C Previous Signature: 07/14/2023 8:42:17 AM Version By: [name] PA-C Entered By: [name] on 07/14/2023 09:07:46 Earmon Phoenix A ([id number]) [id number] Page 11 of 20 -------------------------------------------------------------------------------- Progress Note Details Patient Name: Date of Service: WILLADEAN, GUYTON 07/14/2023 8:30 A M Medical Record Number: [id number] Patient Account Number: [id number] Date of Birth/Sex: Treating RN: 1976/01/27 (47 y.o. Esmeralda Links Primary Care Provider: [name] Other Clinician: [name] Referring Provider: Treating Provider/Extender: [name] in Treatment: 50 Subjective Chief Complaint Information obtained from Patient Left foot ulcer History of Present Illness (HPI) 01/18/18-She is here for initial evaluation of the left great toe ulcer. She is a poor historian in regards to timeframe in detail. She states approximately 4 weeks ago she lacerated her toe on something in the house. She followed up with her primary care who placed her on Bactrim and ultimately a second dose of Bactrim prior to coming to wound clinic. She states she has been treating the toe with peroxide, Betadine and a Band-Aid. She did not check her blood sugar this morning but checked it yesterday morning it was 327; she is unaware of a recent A1c and there are no current records. She saw Dr. she would've orthopedics last week for an old injury to the left ankle, she states he did not see her toe, nor did she bring it to his attention. She smokes approximately 1 pack cigarettes a day. Her social situation is concerning, she arrives this morning with her mother who appears extremely intoxicated/under the influence; her mother was asked to leave the room and be monitored by the patient's grandmother. The patient's aunt then accompanied the patient and the room  right plantar heel and a small wound on the right anterior lower leg. We have been using Hydrofera Blue in the left heel Aquacel on the right heel. The left heel has been in a total contact cast 8/21; patient presents for follow-up. We have been using Hydrofera Blue to the left foot wound under the total contact cast. T the right heel patient has been o using Aquacel Ag. T the anterior leg wound antibiotic ointment. All wounds have healed. o 8/28; patient presents for follow-up. She has been using her surgical shoe with offloading felt pads. Her wounds have remained closed. 9/11; patient presents for final follow-up to assure that her wounds have remained closed. Unfortunately the left foot wound has opened again. But she has been wearing her Surgical shoe for offloading with felt pad. 07-14-2023 upon evaluation today patient appears to be doing well currently in regard to her wound. She has been tolerating the dressing changes without complication with total contact casting. I am going to be helping take care of her at this point as she is transferring from Dr. Mikey Bussing to me since Dr. Mikey Bussing is on maternity leave. Electronic Signature(s) Signed: 07/14/2023 9:41:51 AM By:  [name] PA-C Entered By: [name] on 07/14/2023 09:41:50 -------------------------------------------------------------------------------- Physical Exam Details Patient Name: Date of Service: CACI, ORREN 07/14/2023 8:30 A M Medical Record Number: [id number] Patient Account Number: [id number] Date of Birth/Sex: Treating RN: [birth year] (47 y.o. Esmeralda Links Primary Care Provider: [name] Other Clinician: [name] Referring Provider: Treating Provider/Extender: [name] Weeks in Treatment: [address] Aashka A ([id number]) [id number] Page 9 of 20 Constitutional Well-nourished and well-hydrated in no acute distress. Respiratory normal breathing without difficulty. Psychiatric this patient is able to make decisions and demonstrates good insight into disease process. Alert and Oriented x 3. pleasant and cooperative. Notes Upon inspection patient's wound bed actually showed signs of good granulation epithelization at this point. Fortunately I do not see any signs of worsening overall and the patient is doing quite well at this time. I do not see that she seems to be having any troubles with the cast and I think she is always done quite well with the cast to be honest. Electronic Signature(s) Signed: 07/14/2023 9:42:14 AM By: [name] PA-C Entered By: [name] on 07/14/2023 09:42:13 -------------------------------------------------------------------------------- Physician Orders Details Patient Name: Date of Service: [name] 07/14/2023 8:30 A M Medical Record Number: [id number] Patient Account Number: [id number] Date of Birth/Sex: Treating RN: Dec 28, 1975 (47 y.o. 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 Bathing/ Shower/ Hygiene May shower; gently cleanse wound with antibacterial soap, rinse and pat dry prior to dressing wounds No tub bath. Off-Loading Total Contact Cast to Left Lower Extremity Open toe surgical shoe - right foot Wound Treatment Wound #12R - Foot Wound Laterality: Plantar, Left, Medial Cleanser: Vashe 5.8 (oz) Discharge Instructions: Use vashe 5.8 (oz) as directed Topical: Gentamicin Discharge Instructions: Apply as directed by provider. Prim  Wound/Ulcer Post Debridement is stable. Severity of Tissue Post Debridement is: Fat layer exposed. Post procedure Diagnosis Wound #12R: Same as Pre-Procedure Pre-procedure diagnosis of Wound #12R is a Diabetic Wound/Ulcer of the Lower Extremity located on the Left,Medial,Plantar Foot . There was a T Contact otal Cast Procedure by [name], PA-C. Post procedure Diagnosis Wound #12R: Same as Pre-Procedure Plan Follow-up Appointments: Return  Appointment in 2 weeks. Bathing/ Shower/ Hygiene: May shower; gently cleanse wound with antibacterial soap, rinse and pat dry prior to dressing wounds No tub bath. Off-Loading: T Contact Cast to Left Lower Extremity otal Open toe surgical shoe - right foot WOUND #12R: - Foot Wound Laterality: Plantar, Left, Medial Cleanser: Vashe 5.8 (oz) Discharge Instructions: Use vashe 5.8 (oz) as directed Topical: Gentamicin Discharge Instructions: Apply as directed by provider. Prim Dressing: Hydrofera Blue Ready Transfer Foam, 2.5x2.5 (in/in) ary Discharge Instructions: Apply Hydrofera Blue Ready to wound bed as directed Secondary Dressing: ABD Pad 5x9 (in/in) Discharge Instructions: Cover with ABD pad 1. I would recommend that we have the patient going continue to monitor for any signs of infection or worsening. Will get a try to get her on the schedule where she can come on Mondays as opposed to Fridays as this works better with her dialysis I think that is okay. We will see how things do next week and she is in agreement with the plan. 2. I am going to recommend as well that the patient should continue to utilize the gentamicin followed by the [facility] with a radial and ABD pad to cover. We will see patient back for reevaluation in 1 week here in the clinic. If anything worsens or changes patient will contact our office for additional recommendations. TYQUASIA, PANT A ([id number]) [id number] Page 19 of 20 Electronic Signature(s) Signed: 07/14/2023 9:42:45 AM By: [name] PA-C Entered By: [name] on 07/14/2023 09:42:45 -------------------------------------------------------------------------------- Total Contact Cast Details Patient Name: Date of Service: GERTURDE, KUBA 07/14/2023 8:30 A M Medical Record Number: [id number] Patient Account Number: [id number] Date of Birth/Sex: Treating RN: 03-Jul-1976 (47 y.o. Esmeralda Links Primary Care Provider:  [name] Other Clinician: [name] Referring Provider: Treating Provider/Extender: [name] Weeks in Treatment: 78 T Contact Cast Applied for Wound Assessment: otal Wound #12R Left,Medial,Plantar Foot Performed By: Physician [name], PA-C Post Procedure Diagnosis Same as Pre-procedure Electronic Signature(s) Signed: 07/14/2023 12:35:12 PM By: [name] PA-C Signed: 07/17/2023 5:11:42 PM By: [name] Entered By: [name] on 07/14/2023 09:16:42 -------------------------------------------------------------------------------- SuperBill Details Patient Name: Date of Service: [name] 07/14/2023 Medical Record Number: [id number] Patient Account Number: [id number] Date of Birth/Sex: Treating RN: 1976/01/08 (47 y.o. 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 We have been using Hydrofera Blue in the left heel Aquacel on the right heel. The left heel has been in a total contact cast 8/21; patient presents for follow-up. We have been using Hydrofera Blue to the left foot wound under the total contact cast. T the right heel patient has been o using Aquacel Ag. T the anterior leg wound antibiotic ointment. All wounds have healed. o 8/28; patient presents for follow-up. She has been using her surgical shoe with offloading felt pads. Her wounds have remained closed. 9/11; patient presents for final follow-up to assure that her wounds have remained closed. Unfortunately the left foot wound has opened again. But she has been wearing her Surgical shoe for offloading with felt pad. 07-14-2023 upon evaluation today patient appears to be doing well currently in regard to her wound. She has been tolerating the dressing changes without complication with total contact casting. I am going to be helping take care of her at this point as she is transferring from Dr. Mikey Bussing to me since Dr. Mikey Bussing is on maternity leave. Electronic Signature(s) Signed: 07/14/2023 9:41:51 AM By:  [name] PA-C Entered By: [name] on 07/14/2023 09:41:50 -------------------------------------------------------------------------------- Physical Exam Details Patient Name: Date of Service: CACI, ORREN 07/14/2023 8:30 A M Medical Record Number: [id number] Patient Account Number: [id number] Date of Birth/Sex: Treating RN: [birth year] (47 y.o. Esmeralda Links Primary Care Provider: [name] Other Clinician: [name] Referring Provider: Treating Provider/Extender: [name] Weeks in Treatment: [address] Aashka A ([id number]) [id number] Page 9 of 20 Constitutional Well-nourished and well-hydrated in no acute distress. Respiratory normal breathing without difficulty. Psychiatric this patient is able to make decisions and demonstrates good insight into disease process. Alert and Oriented x 3. pleasant and cooperative. Notes Upon inspection patient's wound bed actually showed signs of good granulation epithelization at this point. Fortunately I do not see any signs of worsening overall and the patient is doing quite well at this time. I do not see that she seems to be having any troubles with the cast and I think she is always done quite well with the cast to be honest. Electronic Signature(s) Signed: 07/14/2023 9:42:14 AM By: [name] PA-C Entered By: [name] on 07/14/2023 09:42:13 -------------------------------------------------------------------------------- Physician Orders Details Patient Name: Date of Service: [name] 07/14/2023 8:30 A M Medical Record Number: [id number] Patient Account Number: [id number] Date of Birth/Sex: Treating RN: Dec 28, 1975 (47 y.o. 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 Bathing/ Shower/ Hygiene May shower; gently cleanse wound with antibacterial soap, rinse and pat dry prior to dressing wounds No tub bath. Off-Loading Total Contact Cast to Left Lower Extremity Open toe surgical shoe - right foot Wound Treatment Wound #12R - Foot Wound Laterality: Plantar, Left, Medial Cleanser: Vashe 5.8 (oz) Discharge Instructions: Use vashe 5.8 (oz) as directed Topical: Gentamicin Discharge Instructions: Apply as directed by provider. Prim  Wound/Ulcer Post Debridement is stable. Severity of Tissue Post Debridement is: Fat layer exposed. Post procedure Diagnosis Wound #12R: Same as Pre-Procedure Pre-procedure diagnosis of Wound #12R is a Diabetic Wound/Ulcer of the Lower Extremity located on the Left,Medial,Plantar Foot . There was a T Contact otal Cast Procedure by [name], PA-C. Post procedure Diagnosis Wound #12R: Same as Pre-Procedure Plan Follow-up Appointments: Return  Appointment in 2 weeks. Bathing/ Shower/ Hygiene: May shower; gently cleanse wound with antibacterial soap, rinse and pat dry prior to dressing wounds No tub bath. Off-Loading: T Contact Cast to Left Lower Extremity otal Open toe surgical shoe - right foot WOUND #12R: - Foot Wound Laterality: Plantar, Left, Medial Cleanser: Vashe 5.8 (oz) Discharge Instructions: Use vashe 5.8 (oz) as directed Topical: Gentamicin Discharge Instructions: Apply as directed by provider. Prim Dressing: Hydrofera Blue Ready Transfer Foam, 2.5x2.5 (in/in) ary Discharge Instructions: Apply Hydrofera Blue Ready to wound bed as directed Secondary Dressing: ABD Pad 5x9 (in/in) Discharge Instructions: Cover with ABD pad 1. I would recommend that we have the patient going continue to monitor for any signs of infection or worsening. Will get a try to get her on the schedule where she can come on Mondays as opposed to Fridays as this works better with her dialysis I think that is okay. We will see how things do next week and she is in agreement with the plan. 2. I am going to recommend as well that the patient should continue to utilize the gentamicin followed by the [facility] with a radial and ABD pad to cover. We will see patient back for reevaluation in 1 week here in the clinic. If anything worsens or changes patient will contact our office for additional recommendations. TYQUASIA, PANT A ([id number]) [id number] Page 19 of 20 Electronic Signature(s) Signed: 07/14/2023 9:42:45 AM By: [name] PA-C Entered By: [name] on 07/14/2023 09:42:45 -------------------------------------------------------------------------------- Total Contact Cast Details Patient Name: Date of Service: GERTURDE, KUBA 07/14/2023 8:30 A M Medical Record Number: [id number] Patient Account Number: [id number] Date of Birth/Sex: Treating RN: 03-Jul-1976 (47 y.o. Esmeralda Links Primary Care Provider:  [name] Other Clinician: [name] Referring Provider: Treating Provider/Extender: [name] Weeks in Treatment: 78 T Contact Cast Applied for Wound Assessment: otal Wound #12R Left,Medial,Plantar Foot Performed By: Physician [name], PA-C Post Procedure Diagnosis Same as Pre-procedure Electronic Signature(s) Signed: 07/14/2023 12:35:12 PM By: [name] PA-C Signed: 07/17/2023 5:11:42 PM By: [name] Entered By: [name] on 07/14/2023 09:16:42 -------------------------------------------------------------------------------- SuperBill Details Patient Name: Date of Service: [name] 07/14/2023 Medical Record Number: [id number] Patient Account Number: [id number] Date of Birth/Sex: Treating RN: 1976/01/08 (47 y.o. 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 Z79.4 Long term (current) use of insulin N18.6 End stage renal disease Z99.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 Facility Procedures : CPT4 Code: 65784696 Description: 11042 - DEB SUBQ TISSUE 20 SQ CM/< ICD-10 Diagnosis Description L97.528 Non-pressure chronic ulcer of other part of left foot with other specified seve Modifier: rity Quantity: 1 Physician Procedures : CPT4 Code Description Modifier [id number] 11042 - WC PHYS SUBQ TISS 20 SQ CM ICD-10 Diagnosis Description L97.528 Non-pressure chronic ulcer of other part of left  Dressing: Hydrofera Blue Ready Transfer Foam, 2.5x2.5 (in/in) ary Discharge Instructions: Apply Hydrofera Blue Ready to wound bed as directed Secondary Dressing: ABD Pad 5x9 (in/in) Discharge Instructions: Cover with ABD pad Electronic Signature(s) Signed: 07/25/2023 4:58:15 PM By: [name] PA-C Signed: 07/27/2023 9:06:03 AM By: [name] Previous Signature: 07/14/2023 12:35:12 PM Version By: [name] PA-C Previous Signature: 07/17/2023 5:11:42 PM Version By: Sarita Haver, Ashyia A ([id number]) [id number] Page 10 of 20 Previous Signature: 07/17/2023 5:11:42 PM Version By: [name] Entered By: [name] on 07/25/2023 12:54:50 -------------------------------------------------------------------------------- Problem List Details Patient Name: Date of Service: [name] 07/14/2023 8:30 A M Medical Record Number: [id number] Patient Account Number: [id number] Date of Birth/Sex: Treating RN: 06-Jun-1976 (47 y.o. 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 03/16/2022 No Yes Z79.4 Long term (current) use of insulin 02/22/2023 No Yes N18.6 End stage renal disease 07/14/2023 No Yes Z99.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 07/14/2023 No Yes Inactive Problems Resolved Problems ICD-10 Code Description Active Date Resolved Date S81.801A Unspecified open wound, right lower leg, initial encounter 05/17/2023 05/17/2023 W54.0XXA Bitten by dog, initial encounter 05/17/2023 05/17/2023 L97.512 Non-pressure chronic ulcer of other part of right foot with fat layer exposed 03/16/2022 03/16/2022 L97.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 of skin 03/16/2022 03/16/2022 U27.253 Type 2 diabetes mellitus with other skin ulcer 05/17/2023 05/17/2023 Electronic Signature(s) Signed: 07/14/2023 9:07:46 AM By: [name] PA-C Previous Signature: 07/14/2023 8:42:17 AM Version By: [name] PA-C Entered By: [name] on 07/14/2023 09:07:46 Earmon Phoenix A ([id number]) [id number] Page 11 of 20 -------------------------------------------------------------------------------- Progress Note Details Patient Name: Date of Service: WILLADEAN, GUYTON 07/14/2023 8:30 A M Medical Record Number: [id number] Patient Account Number: [id number] Date of Birth/Sex: Treating RN: 1976/01/27 (47 y.o. Esmeralda Links Primary Care Provider: [name] Other Clinician: [name] Referring Provider: Treating Provider/Extender: [name] in Treatment: 50 Subjective Chief Complaint Information obtained from Patient Left foot ulcer History of Present Illness (HPI) 01/18/18-She is here for initial evaluation of the left great toe ulcer. She is a poor historian in regards to timeframe in detail. She states approximately 4 weeks ago she lacerated her toe on something in the house. She followed up with her primary care who placed her on Bactrim and ultimately a second dose of Bactrim prior to coming to wound clinic. She states she has been treating the toe with peroxide, Betadine and a Band-Aid. She did not check her blood sugar this morning but checked it yesterday morning it was 327; she is unaware of a recent A1c and there are no current records. She saw Dr. she would've orthopedics last week for an old injury to the left ankle, she states he did not see her toe, nor did she bring it to his attention. She smokes approximately 1 pack cigarettes a day. Her social situation is concerning, she arrives this morning with her mother who appears extremely intoxicated/under the influence; her mother was asked to leave the room and be monitored by the patient's grandmother. The patient's aunt then accompanied the patient and the room  Wound/Ulcer Post Debridement is stable. Severity of Tissue Post Debridement is: Fat layer exposed. Post procedure Diagnosis Wound #12R: Same as Pre-Procedure Pre-procedure diagnosis of Wound #12R is a Diabetic Wound/Ulcer of the Lower Extremity located on the Left,Medial,Plantar Foot . There was a T Contact otal Cast Procedure by [name], PA-C. Post procedure Diagnosis Wound #12R: Same as Pre-Procedure Plan Follow-up Appointments: Return  Appointment in 2 weeks. Bathing/ Shower/ Hygiene: May shower; gently cleanse wound with antibacterial soap, rinse and pat dry prior to dressing wounds No tub bath. Off-Loading: T Contact Cast to Left Lower Extremity otal Open toe surgical shoe - right foot WOUND #12R: - Foot Wound Laterality: Plantar, Left, Medial Cleanser: Vashe 5.8 (oz) Discharge Instructions: Use vashe 5.8 (oz) as directed Topical: Gentamicin Discharge Instructions: Apply as directed by provider. Prim Dressing: Hydrofera Blue Ready Transfer Foam, 2.5x2.5 (in/in) ary Discharge Instructions: Apply Hydrofera Blue Ready to wound bed as directed Secondary Dressing: ABD Pad 5x9 (in/in) Discharge Instructions: Cover with ABD pad 1. I would recommend that we have the patient going continue to monitor for any signs of infection or worsening. Will get a try to get her on the schedule where she can come on Mondays as opposed to Fridays as this works better with her dialysis I think that is okay. We will see how things do next week and she is in agreement with the plan. 2. I am going to recommend as well that the patient should continue to utilize the gentamicin followed by the [facility] with a radial and ABD pad to cover. We will see patient back for reevaluation in 1 week here in the clinic. If anything worsens or changes patient will contact our office for additional recommendations. TYQUASIA, PANT A ([id number]) [id number] Page 19 of 20 Electronic Signature(s) Signed: 07/14/2023 9:42:45 AM By: [name] PA-C Entered By: [name] on 07/14/2023 09:42:45 -------------------------------------------------------------------------------- Total Contact Cast Details Patient Name: Date of Service: GERTURDE, KUBA 07/14/2023 8:30 A M Medical Record Number: [id number] Patient Account Number: [id number] Date of Birth/Sex: Treating RN: 03-Jul-1976 (47 y.o. Esmeralda Links Primary Care Provider:  [name] Other Clinician: [name] Referring Provider: Treating Provider/Extender: [name] Weeks in Treatment: 78 T Contact Cast Applied for Wound Assessment: otal Wound #12R Left,Medial,Plantar Foot Performed By: Physician [name], PA-C Post Procedure Diagnosis Same as Pre-procedure Electronic Signature(s) Signed: 07/14/2023 12:35:12 PM By: [name] PA-C Signed: 07/17/2023 5:11:42 PM By: [name] Entered By: [name] on 07/14/2023 09:16:42 -------------------------------------------------------------------------------- SuperBill Details Patient Name: Date of Service: [name] 07/14/2023 Medical Record Number: [id number] Patient Account Number: [id number] Date of Birth/Sex: Treating RN: 1976/01/08 (47 y.o. 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 Z79.4 Long term (current) use of insulin N18.6 End stage renal disease Z99.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 Facility Procedures : CPT4 Code: [id number] Description: 11042 - DEB SUBQ TISSUE 20 SQ CM/< ICD-10 Diagnosis Description 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 Esmeralda Links Primary Care Provider: [name] Other Clinician: [name] Referring Provider: Treating Provider/Extender: [name] Weeks in Treatment: 83 Information Obtained from: Patient Chief Complaint Left foot ulcer Electronic Signature(s) Signed: 07/14/2023 9:08:01 AM By: [name] PA-C Previous Signature: 07/14/2023 8:42:38 AM Version By: [name] PA-C Entered By: [name] on 07/14/2023 09:08:01 -------------------------------------------------------------------------------- Debridement Details Patient Name: Date of Service: [name] 07/14/2023 8:30 A M Medical Record Number: [id number] Patient Account Number: [id number] Date of Birth/Sex: Treating RN: 12-28-1975 (47 y.o. Esmeralda Links Primary Care Provider: [name] Other Clinician: [name] Referring Provider: Treating Provider/Extender: [name] in Treatment: 69 Debridement Performed for Assessment: Wound #12R Left,Medial,Plantar Foot Performed By: Physician [name], PA-C Debridement Type: Debridement Severity of Tissue Pre Debridement: Fat layer exposed Level of Consciousness (Pre-procedure): Awake and Alert Pre-procedure Verification/Time Out Yes - 09:11 Taken: Start Time: 09:11 Percent of Wound Bed Debrided: 100% T Area Debrided (cm): otal 0.16 Tissue and other material debrided: Viable, Non-Viable, Callus, Slough, Subcutaneous, Slough Level: Skin/Subcutaneous Tissue Debridement Description: Excisional Instrument: Curette Bleeding: Minimum Hemostasis Achieved: Pressure Response to  Treatment: Procedure was tolerated well Level of Consciousness (Post- Awake and Alert procedure): Post Debridement Measurements of Total Wound Length: (cm) 0.4 Width: (cm) 0.5 Depth: (cm) 0.2 Volume: (cm) 0.031 Character of Wound/Ulcer Post Debridement: Stable Severity of Tissue Post Debridement: Fat layer exposed Post Procedure Diagnosis Same as Pre-procedure Electronic Signature(s) Signed: 07/14/2023 12:10:54 PM By: Angelina Pih Signed: 07/14/2023 12:35:12 PM By: [name] PA-C Signed: 07/17/2023 5:11:42 PM By: [name] Entered By: [name] on 07/14/2023 09:16:10 Earmon Phoenix A ([id number]) [id number] Page 2 of 20 -------------------------------------------------------------------------------- HPI Details Patient Name: Date of Service: SHERRIE, MARSAN 07/14/2023 8:30 A M Medical Record Number: [id number] Patient Account Number: [id number] Date of Birth/Sex: Treating RN: 1976/09/05 (47 y.o. Esmeralda Links Primary Care Provider: [name] Other Clinician: [name] Referring Provider: Treating Provider/Extender: [name] in Treatment: 53 History of Present Illness HPI Description: 01/18/18-She is here for initial evaluation of the left great toe ulcer. She is a poor historian in regards to timeframe in detail. She states approximately 4 weeks ago she lacerated her toe on something in the house. She followed up with her primary care who placed her on Bactrim and ultimately a second dose of Bactrim prior to coming to wound clinic. She states she has been treating the toe with peroxide, Betadine and a Band-Aid. She did not check her blood sugar this morning but checked it yesterday morning it was 327; she is unaware of a recent A1c and there are no current records. She saw Dr. she would've orthopedics last week for an old injury to the left ankle, she states he did not see her toe, nor did she bring it  to his attention. She smokes approximately 1 pack cigarettes a day. Her social situation is concerning, she arrives this morning with her mother who appears extremely intoxicated/under the influence; her mother was asked to leave the room and be monitored by the patient's grandmother. The patient's aunt then accompanied the patient and the room throughout the rest of the appointment. We had a lengthy discussion regarding the  MARCHELLA, HIBBARD A ([id number]) [id number] Page 1 of 20 Visit Report for 07/14/2023 Chief Complaint Document Details Patient Name: Date of Service: KRISTIN, BARCUS 07/14/2023 8:30 A M Medical Record Number: [id number] Patient Account Number: [id number] Date of Birth/Sex: Treating RN: Jun 16, 1976 (47 y.o. Esmeralda Links Primary Care Provider: [name] Other Clinician: [name] Referring Provider: Treating Provider/Extender: [name] Weeks in Treatment: 83 Information Obtained from: Patient Chief Complaint Left foot ulcer Electronic Signature(s) Signed: 07/14/2023 9:08:01 AM By: [name] PA-C Previous Signature: 07/14/2023 8:42:38 AM Version By: [name] PA-C Entered By: [name] on 07/14/2023 09:08:01 -------------------------------------------------------------------------------- Debridement Details Patient Name: Date of Service: [name] 07/14/2023 8:30 A M Medical Record Number: [id number] Patient Account Number: [id number] Date of Birth/Sex: Treating RN: 12-28-1975 (47 y.o. Esmeralda Links Primary Care Provider: [name] Other Clinician: [name] Referring Provider: Treating Provider/Extender: [name] in Treatment: 69 Debridement Performed for Assessment: Wound #12R Left,Medial,Plantar Foot Performed By: Physician [name], PA-C Debridement Type: Debridement Severity of Tissue Pre Debridement: Fat layer exposed Level of Consciousness (Pre-procedure): Awake and Alert Pre-procedure Verification/Time Out Yes - 09:11 Taken: Start Time: 09:11 Percent of Wound Bed Debrided: 100% T Area Debrided (cm): otal 0.16 Tissue and other material debrided: Viable, Non-Viable, Callus, Slough, Subcutaneous, Slough Level: Skin/Subcutaneous Tissue Debridement Description: Excisional Instrument: Curette Bleeding: Minimum Hemostasis Achieved: Pressure Response to  Treatment: Procedure was tolerated well Level of Consciousness (Post- Awake and Alert procedure): Post Debridement Measurements of Total Wound Length: (cm) 0.4 Width: (cm) 0.5 Depth: (cm) 0.2 Volume: (cm) 0.031 Character of Wound/Ulcer Post Debridement: Stable Severity of Tissue Post Debridement: Fat layer exposed Post Procedure Diagnosis Same as Pre-procedure Electronic Signature(s) Signed: 07/14/2023 12:10:54 PM By: Angelina Pih Signed: 07/14/2023 12:35:12 PM By: [name] PA-C Signed: 07/17/2023 5:11:42 PM By: [name] Entered By: [name] on 07/14/2023 09:16:10 Earmon Phoenix A ([id number]) [id number] Page 2 of 20 -------------------------------------------------------------------------------- HPI Details Patient Name: Date of Service: SHERRIE, MARSAN 07/14/2023 8:30 A M Medical Record Number: [id number] Patient Account Number: [id number] Date of Birth/Sex: Treating RN: 1976/09/05 (47 y.o. Esmeralda Links Primary Care Provider: [name] Other Clinician: [name] Referring Provider: Treating Provider/Extender: [name] in Treatment: 53 History of Present Illness HPI Description: 01/18/18-She is here for initial evaluation of the left great toe ulcer. She is a poor historian in regards to timeframe in detail. She states approximately 4 weeks ago she lacerated her toe on something in the house. She followed up with her primary care who placed her on Bactrim and ultimately a second dose of Bactrim prior to coming to wound clinic. She states she has been treating the toe with peroxide, Betadine and a Band-Aid. She did not check her blood sugar this morning but checked it yesterday morning it was 327; she is unaware of a recent A1c and there are no current records. She saw Dr. she would've orthopedics last week for an old injury to the left ankle, she states he did not see her toe, nor did she bring it  to his attention. She smokes approximately 1 pack cigarettes a day. Her social situation is concerning, she arrives this morning with her mother who appears extremely intoxicated/under the influence; her mother was asked to leave the room and be monitored by the patient's grandmother. The patient's aunt then accompanied the patient and the room throughout the rest of the appointment. We had a lengthy discussion regarding the  Dressing: Hydrofera Blue Ready Transfer Foam, 2.5x2.5 (in/in) ary Discharge Instructions: Apply Hydrofera Blue Ready to wound bed as directed Secondary Dressing: ABD Pad 5x9 (in/in) Discharge Instructions: Cover with ABD pad Electronic Signature(s) Signed: 07/25/2023 4:58:15 PM By: [name] PA-C Signed: 07/27/2023 9:06:03 AM By: [name] Previous Signature: 07/14/2023 12:35:12 PM Version By: [name] PA-C Previous Signature: 07/17/2023 5:11:42 PM Version By: Sarita Haver, Ashyia A ([id number]) [id number] Page 10 of 20 Previous Signature: 07/17/2023 5:11:42 PM Version By: [name] Entered By: [name] on 07/25/2023 12:54:50 -------------------------------------------------------------------------------- Problem List Details Patient Name: Date of Service: [name] 07/14/2023 8:30 A M Medical Record Number: [id number] Patient Account Number: [id number] Date of Birth/Sex: Treating RN: 06-Jun-1976 (47 y.o. 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 03/16/2022 No Yes Z79.4 Long term (current) use of insulin 02/22/2023 No Yes N18.6 End stage renal disease 07/14/2023 No Yes Z99.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 07/14/2023 No Yes Inactive Problems Resolved Problems ICD-10 Code Description Active Date Resolved Date S81.801A Unspecified open wound, right lower leg, initial encounter 05/17/2023 05/17/2023 W54.0XXA Bitten by dog, initial encounter 05/17/2023 05/17/2023 L97.512 Non-pressure chronic ulcer of other part of right foot with fat layer exposed 03/16/2022 03/16/2022 L97.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 of skin 03/16/2022 03/16/2022 U27.253 Type 2 diabetes mellitus with other skin ulcer 05/17/2023 05/17/2023 Electronic Signature(s) Signed: 07/14/2023 9:07:46 AM By: [name] PA-C Previous Signature: 07/14/2023 8:42:17 AM Version By: [name] PA-C Entered By: [name] on 07/14/2023 09:07:46 Earmon Phoenix A ([id number]) [id number] Page 11 of 20 -------------------------------------------------------------------------------- Progress Note Details Patient Name: Date of Service: WILLADEAN, GUYTON 07/14/2023 8:30 A M Medical Record Number: [id number] Patient Account Number: [id number] Date of Birth/Sex: Treating RN: 1976/01/27 (47 y.o. Esmeralda Links Primary Care Provider: [name] Other Clinician: [name] Referring Provider: Treating Provider/Extender: [name] in Treatment: 50 Subjective Chief Complaint Information obtained from Patient Left foot ulcer History of Present Illness (HPI) 01/18/18-She is here for initial evaluation of the left great toe ulcer. She is a poor historian in regards to timeframe in detail. She states approximately 4 weeks ago she lacerated her toe on something in the house. She followed up with her primary care who placed her on Bactrim and ultimately a second dose of Bactrim prior to coming to wound clinic. She states she has been treating the toe with peroxide, Betadine and a Band-Aid. She did not check her blood sugar this morning but checked it yesterday morning it was 327; she is unaware of a recent A1c and there are no current records. She saw Dr. she would've orthopedics last week for an old injury to the left ankle, she states he did not see her toe, nor did she bring it to his attention. She smokes approximately 1 pack cigarettes a day. Her social situation is concerning, she arrives this morning with her mother who appears extremely intoxicated/under the influence; her mother was asked to leave the room and be monitored by the patient's grandmother. The patient's aunt then accompanied the patient and the room  Dressing: Hydrofera Blue Ready Transfer Foam, 2.5x2.5 (in/in) ary Discharge Instructions: Apply Hydrofera Blue Ready to wound bed as directed Secondary Dressing: ABD Pad 5x9 (in/in) Discharge Instructions: Cover with ABD pad Electronic Signature(s) Signed: 07/25/2023 4:58:15 PM By: [name] PA-C Signed: 07/27/2023 9:06:03 AM By: [name] Previous Signature: 07/14/2023 12:35:12 PM Version By: [name] PA-C Previous Signature: 07/17/2023 5:11:42 PM Version By: Sarita Haver, Ashyia A ([id number]) [id number] Page 10 of 20 Previous Signature: 07/17/2023 5:11:42 PM Version By: [name] Entered By: [name] on 07/25/2023 12:54:50 -------------------------------------------------------------------------------- Problem List Details Patient Name: Date of Service: [name] 07/14/2023 8:30 A M Medical Record Number: [id number] Patient Account Number: [id number] Date of Birth/Sex: Treating RN: 06-Jun-1976 (47 y.o. 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 03/16/2022 No Yes Z79.4 Long term (current) use of insulin 02/22/2023 No Yes N18.6 End stage renal disease 07/14/2023 No Yes Z99.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 07/14/2023 No Yes Inactive Problems Resolved Problems ICD-10 Code Description Active Date Resolved Date S81.801A Unspecified open wound, right lower leg, initial encounter 05/17/2023 05/17/2023 W54.0XXA Bitten by dog, initial encounter 05/17/2023 05/17/2023 L97.512 Non-pressure chronic ulcer of other part of right foot with fat layer exposed 03/16/2022 03/16/2022 L97.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 of skin 03/16/2022 03/16/2022 U27.253 Type 2 diabetes mellitus with other skin ulcer 05/17/2023 05/17/2023 Electronic Signature(s) Signed: 07/14/2023 9:07:46 AM By: [name] PA-C Previous Signature: 07/14/2023 8:42:17 AM Version By: [name] PA-C Entered By: [name] on 07/14/2023 09:07:46 Earmon Phoenix A ([id number]) [id number] Page 11 of 20 -------------------------------------------------------------------------------- Progress Note Details Patient Name: Date of Service: WILLADEAN, GUYTON 07/14/2023 8:30 A M Medical Record Number: [id number] Patient Account Number: [id number] Date of Birth/Sex: Treating RN: 1976/01/27 (47 y.o. Esmeralda Links Primary Care Provider: [name] Other Clinician: [name] Referring Provider: Treating Provider/Extender: [name] in Treatment: 50 Subjective Chief Complaint Information obtained from Patient Left foot ulcer History of Present Illness (HPI) 01/18/18-She is here for initial evaluation of the left great toe ulcer. She is a poor historian in regards to timeframe in detail. She states approximately 4 weeks ago she lacerated her toe on something in the house. She followed up with her primary care who placed her on Bactrim and ultimately a second dose of Bactrim prior to coming to wound clinic. She states she has been treating the toe with peroxide, Betadine and a Band-Aid. She did not check her blood sugar this morning but checked it yesterday morning it was 327; she is unaware of a recent A1c and there are no current records. She saw Dr. she would've orthopedics last week for an old injury to the left ankle, she states he did not see her toe, nor did she bring it to his attention. She smokes approximately 1 pack cigarettes a day. Her social situation is concerning, she arrives this morning with her mother who appears extremely intoxicated/under the influence; her mother was asked to leave the room and be monitored by the patient's grandmother. The patient's aunt then accompanied the patient and the room

## 2023-07-20 ENCOUNTER — Encounter: Payer: MEDICAID | Admitting: Physician Assistant

## 2023-07-24 ENCOUNTER — Encounter: Payer: MEDICAID | Admitting: Physician Assistant

## 2023-07-24 DIAGNOSIS — E11622 Type 2 diabetes mellitus with other skin ulcer: Secondary | ICD-10-CM | POA: Diagnosis not present

## 2023-07-24 NOTE — Progress Notes (Signed)
02/22/2023 No Yes N18.6 End stage renal disease 07/14/2023 No Yes Z99.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 07/14/2023 No Yes Inactive Problems Resolved Problems ICD-10 Code Description Active Date Resolved Date L97.512 Non-pressure chronic ulcer of other part of right foot with fat layer exposed 03/16/2022 03/16/2022 L97.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 of skin 03/16/2022 03/16/2022 W54.0XXA Bitten by dog, initial encounter 05/17/2023 05/17/2023 S81.801A Unspecified open wound, right lower leg, initial encounter 05/17/2023 05/17/2023 X91.478 Type 2 diabetes mellitus with other skin ulcer 05/17/2023 05/17/2023 MARYRUTH, APPLE A ([id number]) [id number] Page 11 of 20 Electronic Signature(s) Signed: 07/24/2023 3:20:56 PM By: [name] PA-C Entered By: [name] on 07/24/2023 15:20:56 -------------------------------------------------------------------------------- Progress Note Details Patient Name: Date of Service: TERSA, FOTOPOULOS 07/24/2023 2:45 PM Medical Record Number:  [id number] Patient Account Number: [id number] Date of Birth/Sex: Treating RN: 23-Mar-1976 (47 y.o. [name] Primary Care Provider: [name] Other Clinician: [name] Referring Provider: Treating Provider/Extender: [name] in Treatment: 33 Subjective Chief Complaint Information obtained from Patient Left foot ulcer History of Present Illness (HPI) 01/18/18-She is here for initial evaluation of the left great toe ulcer. She is a poor historian in regards to timeframe in detail. She states approximately 4 weeks ago she lacerated her toe on something in the house. She followed up with her primary care who placed her on Bactrim and ultimately a second dose of Bactrim prior to coming to wound clinic. She states she has been treating the toe with peroxide, Betadine and a Band-Aid. She did not check her blood sugar this morning but checked it yesterday morning it was 327; she is unaware of a recent A1c and there are no current records. She saw Dr. she would've orthopedics last week for an old injury to the left ankle, she states he did not see her toe, nor did she bring it to his attention. She smokes approximately 1 pack cigarettes a day. Her social situation is concerning, she arrives this morning with her mother who appears extremely intoxicated/under the influence; her mother was asked to leave the room and be monitored by the patient's grandmother. The patient's aunt then accompanied the patient and the room throughout the rest of the appointment. We had a lengthy discussion regarding the deleterious effects of uncontrolled hyperglycemia and smoking as it relates to wound healing and overall health. She was strongly encouraged to decrease her smoking and get her diabetes under better control. She states she is currently on a diet and has cut down her [facility] consumption. The left toe is erythematous, macerated and slightly edematous with malodor  present. The edema in her left foot is below her baseline, there is no erythema streaking. We will treat her with Santyl, doxycycline; we have ordered and xray, culture and provided a Peg assist surgical shoe and cultured the wound. 01/25/18-She is here in follow-up evaluation for a left great toe ulcer and presents with an abscess to her suprapubic area. She states her blood sugars remain elevated, feeling "sick" and if levels are below 250, but she is trying. She has made no attempt to decrease her smoking stating that we "can't take away her food in her cigarettes". She has been compliant with offloading using the PEG assist you. She is using Santyl daily. the culture obtained last week grew staph aureus and Enterococcus faecalis; continues on the doxycycline and Augmentin was added on Monday. The suprapubic area has erythema, no femoral variation, purple  02/22/2023 No Yes N18.6 End stage renal disease 07/14/2023 No Yes Z99.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 07/14/2023 No Yes Inactive Problems Resolved Problems ICD-10 Code Description Active Date Resolved Date L97.512 Non-pressure chronic ulcer of other part of right foot with fat layer exposed 03/16/2022 03/16/2022 L97.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 of skin 03/16/2022 03/16/2022 W54.0XXA Bitten by dog, initial encounter 05/17/2023 05/17/2023 S81.801A Unspecified open wound, right lower leg, initial encounter 05/17/2023 05/17/2023 X91.478 Type 2 diabetes mellitus with other skin ulcer 05/17/2023 05/17/2023 MARYRUTH, APPLE A ([id number]) [id number] Page 11 of 20 Electronic Signature(s) Signed: 07/24/2023 3:20:56 PM By: [name] PA-C Entered By: [name] on 07/24/2023 15:20:56 -------------------------------------------------------------------------------- Progress Note Details Patient Name: Date of Service: TERSA, FOTOPOULOS 07/24/2023 2:45 PM Medical Record Number:  [id number] Patient Account Number: [id number] Date of Birth/Sex: Treating RN: 23-Mar-1976 (47 y.o. [name] Primary Care Provider: [name] Other Clinician: [name] Referring Provider: Treating Provider/Extender: [name] in Treatment: 33 Subjective Chief Complaint Information obtained from Patient Left foot ulcer History of Present Illness (HPI) 01/18/18-She is here for initial evaluation of the left great toe ulcer. She is a poor historian in regards to timeframe in detail. She states approximately 4 weeks ago she lacerated her toe on something in the house. She followed up with her primary care who placed her on Bactrim and ultimately a second dose of Bactrim prior to coming to wound clinic. She states she has been treating the toe with peroxide, Betadine and a Band-Aid. She did not check her blood sugar this morning but checked it yesterday morning it was 327; she is unaware of a recent A1c and there are no current records. She saw Dr. she would've orthopedics last week for an old injury to the left ankle, she states he did not see her toe, nor did she bring it to his attention. She smokes approximately 1 pack cigarettes a day. Her social situation is concerning, she arrives this morning with her mother who appears extremely intoxicated/under the influence; her mother was asked to leave the room and be monitored by the patient's grandmother. The patient's aunt then accompanied the patient and the room throughout the rest of the appointment. We had a lengthy discussion regarding the deleterious effects of uncontrolled hyperglycemia and smoking as it relates to wound healing and overall health. She was strongly encouraged to decrease her smoking and get her diabetes under better control. She states she is currently on a diet and has cut down her [facility] consumption. The left toe is erythematous, macerated and slightly edematous with malodor  present. The edema in her left foot is below her baseline, there is no erythema streaking. We will treat her with Santyl, doxycycline; we have ordered and xray, culture and provided a Peg assist surgical shoe and cultured the wound. 01/25/18-She is here in follow-up evaluation for a left great toe ulcer and presents with an abscess to her suprapubic area. She states her blood sugars remain elevated, feeling "sick" and if levels are below 250, but she is trying. She has made no attempt to decrease her smoking stating that we "can't take away her food in her cigarettes". She has been compliant with offloading using the PEG assist you. She is using Santyl daily. the culture obtained last week grew staph aureus and Enterococcus faecalis; continues on the doxycycline and Augmentin was added on Monday. The suprapubic area has erythema, no femoral variation, purple  NONNA, RENNINGER A ([id number]) [id number] Page 1 of 20 Visit Report for 07/24/2023 Chief Complaint Document Details Patient Name: Date of Service: RAHIMA, FLEISHMAN 07/24/2023 2:45 PM Medical Record Number: [id number] Patient Account Number: [id number] Date of Birth/Sex: Treating RN: [birth year] (47 y.o. [name] Primary Care Provider: [name] Other Clinician: [name] Referring Provider: Treating Provider/Extender: [name] Weeks in Treatment: 42 Information Obtained from: Patient Chief Complaint Left foot ulcer Electronic Signature(s) Signed: 07/24/2023 3:21:00 PM By: [name] PA-C Entered By: [name] on 07/24/2023 15:21:00 -------------------------------------------------------------------------------- HPI Details Patient Name: Date of Service: MERY, GUADALUPE 07/24/2023 2:45 PM Medical Record Number: [id number] Patient Account Number: [id number] Date of Birth/Sex: Treating RN: 1976-10-04 (47 y.o. [name] Primary Care Provider: [name] Other Clinician: [name] Referring Provider: Treating Provider/Extender: [name] in Treatment: 66 History of Present Illness HPI Description: 01/18/18-She is here for initial evaluation of the left great toe ulcer. She is a poor historian in regards to timeframe in detail. She states approximately 4 weeks ago she lacerated her toe on something in the house. She followed up with her primary care who placed her on Bactrim and ultimately a second dose of Bactrim prior to coming to wound clinic. She states she has been treating the toe with peroxide, Betadine and a Band-Aid. She did not check her blood sugar this morning but checked it yesterday morning it was 327; she is unaware of a recent A1c and there are no current records. She saw Dr. she would've orthopedics last week for an old injury to the left ankle, she states he did not see  her toe, nor did she bring it to his attention. She smokes approximately 1 pack cigarettes a day. Her social situation is concerning, she arrives this morning with her mother who appears extremely intoxicated/under the influence; her mother was asked to leave the room and be monitored by the patient's grandmother. The patient's aunt then accompanied the patient and the room throughout the rest of the appointment. We had a lengthy discussion regarding the deleterious effects of uncontrolled hyperglycemia and smoking as it relates to wound healing and overall health. She was strongly encouraged to decrease her smoking and get her diabetes under better control. She states she is currently on a diet and has cut down her [facility] consumption. The left toe is erythematous, macerated and slightly edematous with malodor present. The edema in her left foot is below her baseline, there is no erythema streaking. We will treat her with Santyl, doxycycline; we have ordered and xray, culture and provided a Peg assist surgical shoe and cultured the wound. 01/25/18-She is here in follow-up evaluation for a left great toe ulcer and presents with an abscess to her suprapubic area. She states her blood sugars remain elevated, feeling "sick" and if levels are below 250, but she is trying. She has made no attempt to decrease her smoking stating that we "can't take away her food in her cigarettes". She has been compliant with offloading using the PEG assist you. She is using Santyl daily. the culture obtained last week grew staph aureus and Enterococcus faecalis; continues on the doxycycline and Augmentin was added on Monday. The suprapubic area has erythema, no femoral variation, purple discoloration, minimal induration, was accessed with a cotton tip applicator with sanguinopurulent drainage, this was cultured, I suspect the MARCINE, GADWAY A ([id number]) [phone].pdf Page 2 of 20 current  antibiotic treatment will cover and we  this is much better than last week no debridement even necessary today. We may have to perform some slight debridement come next week we will see how things go. Monitor for any signs of worsening overall and obviously if anything changes she should be letting us know. Electronic Signature(s) Signed: 07/24/2023 3:41:50 PM By: [name] PA-C Entered By: [name] on 07/24/2023 15:41:50 -------------------------------------------------------------------------------- Physical Exam Details Patient Name: Date of Service: ISHITA, MCNERNEY 07/24/2023 2:45 PM Medical Record Number: [id number] Patient Account Number: [id number] Date of Birth/Sex: Treating RN: 02-14-1976 (47 y.o. [name] Primary Care Provider: [name] Other Clinician: [name] Referring Provider: Treating Provider/Extender: [name] Weeks in Treatment: 87 Constitutional Thin and well-hydrated in no acute distress. Respiratory normal breathing without difficulty. Psychiatric this patient is able to make decisions and demonstrates good insight into disease process. Alert and Oriented x 3. pleasant and cooperative. Notes Upon inspection patient's wound bed actually showed signs of good granulation and epithelization at this point. Fortunately I do not see worsening I think that the total contact cast is doing quite well I would recommend that we continue with the plan currently. any signs of WAKEELAH, SOLAN A ([id number]) [id number] Page 9 of 20 Electronic Signature(s) Signed: 07/24/2023 3:42:06 PM By: [name] PA-C Entered By: [name] on 07/24/2023  15:42:06 -------------------------------------------------------------------------------- Physician Orders Details Patient Name: Date of Service: CHARLEA, NARDO 07/24/2023 2:45 PM Medical Record Number: [id number] Patient Account Number: [id number] Date of Birth/Sex: Treating RN: 1976-08-14 (47 y.o. 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 Bathing/ Shower/ Hygiene May shower; gently cleanse wound with antibacterial soap, rinse and pat dry prior to dressing wounds No tub bath. Off-Loading Total Contact Cast to Left Lower Extremity Open toe surgical shoe - right foot Wound Treatment Wound #12R - Foot Wound Laterality: Plantar, Left, Medial Cleanser: Vashe 5.8 (oz) Discharge Instructions: Use vashe 5.8 (oz) as directed Topical: Gentamicin Discharge Instructions: Apply as directed by provider. Prim Dressing: Hydrofera Blue Ready Transfer Foam, 2.5x2.5 (in/in) ary Discharge Instructions: Apply Hydrofera Blue Ready to wound bed as directed Secondary Dressing: ABD Pad 5x9 (in/in) Discharge Instructions: Cover with ABD pad Electronic Signature(s) Signed: 07/24/2023 4:50:01 PM By: [name] PA-C Entered By: [name] on 07/24/2023 15:25:15 Earmon Phoenix A ([id number]) [id number] Page 10 of  20 -------------------------------------------------------------------------------- Problem List Details Patient Name: Date of Service: LAVANA, HUCKEBA 07/24/2023 2:45 PM Medical Record Number: [id number] Patient Account Number: [id number] Date of Birth/Sex: Treating RN: 1976/07/02 (47 y.o. 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 [name] Primary Care Provider: [name] Other Clinician: [name] Referring Provider: Treating Provider/Extender: [name] Weeks in Treatment: 42 Information Obtained from: Patient Chief Complaint Left foot ulcer Electronic Signature(s) Signed: 07/24/2023 3:21:00 PM By: [name] PA-C Entered By: [name] on 07/24/2023 15:21:00 -------------------------------------------------------------------------------- HPI Details Patient Name: Date of Service: MERY, GUADALUPE 07/24/2023 2:45 PM Medical Record Number: [id number] Patient Account Number: [id number] Date of Birth/Sex: Treating RN: 1976-10-04 (47 y.o. [name] Primary Care Provider: [name] Other Clinician: [name] Referring Provider: Treating Provider/Extender: [name] in Treatment: 66 History of Present Illness HPI Description: 01/18/18-She is here for initial evaluation of the left great toe ulcer. She is a poor historian in regards to timeframe in detail. She states approximately 4 weeks ago she lacerated her toe on something in the house. She followed up with her primary care who placed her on Bactrim and ultimately a second dose of Bactrim prior to coming to wound clinic. She states she has been treating the toe with peroxide, Betadine and a Band-Aid. She did not check her blood sugar this morning but checked it yesterday morning it was 327; she is unaware of a recent A1c and there are no current records. She saw Dr. she would've orthopedics last week for an old injury to the left ankle, she states he did not see  her toe, nor did she bring it to his attention. She smokes approximately 1 pack cigarettes a day. Her social situation is concerning, she arrives this morning with her mother who appears extremely intoxicated/under the influence; her mother was asked to leave the room and be monitored by the patient's grandmother. The patient's aunt then accompanied the patient and the room throughout the rest of the appointment. We had a lengthy discussion regarding the deleterious effects of uncontrolled hyperglycemia and smoking as it relates to wound healing and overall health. She was strongly encouraged to decrease her smoking and get her diabetes under better control. She states she is currently on a diet and has cut down her [facility] consumption. The left toe is erythematous, macerated and slightly edematous with malodor present. The edema in her left foot is below her baseline, there is no erythema streaking. We will treat her with Santyl, doxycycline; we have ordered and xray, culture and provided a Peg assist surgical shoe and cultured the wound. 01/25/18-She is here in follow-up evaluation for a left great toe ulcer and presents with an abscess to her suprapubic area. She states her blood sugars remain elevated, feeling "sick" and if levels are below 250, but she is trying. She has made no attempt to decrease her smoking stating that we "can't take away her food in her cigarettes". She has been compliant with offloading using the PEG assist you. She is using Santyl daily. the culture obtained last week grew staph aureus and Enterococcus faecalis; continues on the doxycycline and Augmentin was added on Monday. The suprapubic area has erythema, no femoral variation, purple discoloration, minimal induration, was accessed with a cotton tip applicator with sanguinopurulent drainage, this was cultured, I suspect the MARCINE, GADWAY A ([id number]) [phone].pdf Page 2 of 20 current  antibiotic treatment will cover and we  NONNA, RENNINGER A ([id number]) [id number] Page 1 of 20 Visit Report for 07/24/2023 Chief Complaint Document Details Patient Name: Date of Service: RAHIMA, FLEISHMAN 07/24/2023 2:45 PM Medical Record Number: [id number] Patient Account Number: [id number] Date of Birth/Sex: Treating RN: [birth year] (47 y.o. [name] Primary Care Provider: [name] Other Clinician: [name] Referring Provider: Treating Provider/Extender: [name] Weeks in Treatment: 42 Information Obtained from: Patient Chief Complaint Left foot ulcer Electronic Signature(s) Signed: 07/24/2023 3:21:00 PM By: [name] PA-C Entered By: [name] on 07/24/2023 15:21:00 -------------------------------------------------------------------------------- HPI Details Patient Name: Date of Service: MERY, GUADALUPE 07/24/2023 2:45 PM Medical Record Number: [id number] Patient Account Number: [id number] Date of Birth/Sex: Treating RN: 1976-10-04 (47 y.o. [name] Primary Care Provider: [name] Other Clinician: [name] Referring Provider: Treating Provider/Extender: [name] in Treatment: 66 History of Present Illness HPI Description: 01/18/18-She is here for initial evaluation of the left great toe ulcer. She is a poor historian in regards to timeframe in detail. She states approximately 4 weeks ago she lacerated her toe on something in the house. She followed up with her primary care who placed her on Bactrim and ultimately a second dose of Bactrim prior to coming to wound clinic. She states she has been treating the toe with peroxide, Betadine and a Band-Aid. She did not check her blood sugar this morning but checked it yesterday morning it was 327; she is unaware of a recent A1c and there are no current records. She saw Dr. she would've orthopedics last week for an old injury to the left ankle, she states he did not see  her toe, nor did she bring it to his attention. She smokes approximately 1 pack cigarettes a day. Her social situation is concerning, she arrives this morning with her mother who appears extremely intoxicated/under the influence; her mother was asked to leave the room and be monitored by the patient's grandmother. The patient's aunt then accompanied the patient and the room throughout the rest of the appointment. We had a lengthy discussion regarding the deleterious effects of uncontrolled hyperglycemia and smoking as it relates to wound healing and overall health. She was strongly encouraged to decrease her smoking and get her diabetes under better control. She states she is currently on a diet and has cut down her [facility] consumption. The left toe is erythematous, macerated and slightly edematous with malodor present. The edema in her left foot is below her baseline, there is no erythema streaking. We will treat her with Santyl, doxycycline; we have ordered and xray, culture and provided a Peg assist surgical shoe and cultured the wound. 01/25/18-She is here in follow-up evaluation for a left great toe ulcer and presents with an abscess to her suprapubic area. She states her blood sugars remain elevated, feeling "sick" and if levels are below 250, but she is trying. She has made no attempt to decrease her smoking stating that we "can't take away her food in her cigarettes". She has been compliant with offloading using the PEG assist you. She is using Santyl daily. the culture obtained last week grew staph aureus and Enterococcus faecalis; continues on the doxycycline and Augmentin was added on Monday. The suprapubic area has erythema, no femoral variation, purple discoloration, minimal induration, was accessed with a cotton tip applicator with sanguinopurulent drainage, this was cultured, I suspect the MARCINE, GADWAY A ([id number]) [phone].pdf Page 2 of 20 current  antibiotic treatment will cover and we  this is much better than last week no debridement even necessary today. We may have to perform some slight debridement come next week we will see how things go. Monitor for any signs of worsening overall and obviously if anything changes she should be letting us know. Electronic Signature(s) Signed: 07/24/2023 3:41:50 PM By: [name] PA-C Entered By: [name] on 07/24/2023 15:41:50 -------------------------------------------------------------------------------- Physical Exam Details Patient Name: Date of Service: ISHITA, MCNERNEY 07/24/2023 2:45 PM Medical Record Number: [id number] Patient Account Number: [id number] Date of Birth/Sex: Treating RN: 02-14-1976 (47 y.o. [name] Primary Care Provider: [name] Other Clinician: [name] Referring Provider: Treating Provider/Extender: [name] Weeks in Treatment: 87 Constitutional Thin and well-hydrated in no acute distress. Respiratory normal breathing without difficulty. Psychiatric this patient is able to make decisions and demonstrates good insight into disease process. Alert and Oriented x 3. pleasant and cooperative. Notes Upon inspection patient's wound bed actually showed signs of good granulation and epithelization at this point. Fortunately I do not see worsening I think that the total contact cast is doing quite well I would recommend that we continue with the plan currently. any signs of WAKEELAH, SOLAN A ([id number]) [id number] Page 9 of 20 Electronic Signature(s) Signed: 07/24/2023 3:42:06 PM By: [name] PA-C Entered By: [name] on 07/24/2023  15:42:06 -------------------------------------------------------------------------------- Physician Orders Details Patient Name: Date of Service: CHARLEA, NARDO 07/24/2023 2:45 PM Medical Record Number: [id number] Patient Account Number: [id number] Date of Birth/Sex: Treating RN: 1976-08-14 (47 y.o. 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 Bathing/ Shower/ Hygiene May shower; gently cleanse wound with antibacterial soap, rinse and pat dry prior to dressing wounds No tub bath. Off-Loading Total Contact Cast to Left Lower Extremity Open toe surgical shoe - right foot Wound Treatment Wound #12R - Foot Wound Laterality: Plantar, Left, Medial Cleanser: Vashe 5.8 (oz) Discharge Instructions: Use vashe 5.8 (oz) as directed Topical: Gentamicin Discharge Instructions: Apply as directed by provider. Prim Dressing: Hydrofera Blue Ready Transfer Foam, 2.5x2.5 (in/in) ary Discharge Instructions: Apply Hydrofera Blue Ready to wound bed as directed Secondary Dressing: ABD Pad 5x9 (in/in) Discharge Instructions: Cover with ABD pad Electronic Signature(s) Signed: 07/24/2023 4:50:01 PM By: [name] PA-C Entered By: [name] on 07/24/2023 15:25:15 Earmon Phoenix A ([id number]) [id number] Page 10 of  20 -------------------------------------------------------------------------------- Problem List Details Patient Name: Date of Service: LAVANA, HUCKEBA 07/24/2023 2:45 PM Medical Record Number: [id number] Patient Account Number: [id number] Date of Birth/Sex: Treating RN: 1976/07/02 (47 y.o. 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 03/16/2022 No Yes severity E11.42 Type 2 diabetes mellitus with diabetic polyneuropathy 03/16/2022 No Yes Z79.4 Long term (current) use of insulin  02/22/2023 No Yes N18.6 End stage renal disease 07/14/2023 No Yes Z99.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 07/14/2023 No Yes Inactive Problems Resolved Problems ICD-10 Code Description Active Date Resolved Date L97.512 Non-pressure chronic ulcer of other part of right foot with fat layer exposed 03/16/2022 03/16/2022 L97.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 of skin 03/16/2022 03/16/2022 W54.0XXA Bitten by dog, initial encounter 05/17/2023 05/17/2023 S81.801A Unspecified open wound, right lower leg, initial encounter 05/17/2023 05/17/2023 X91.478 Type 2 diabetes mellitus with other skin ulcer 05/17/2023 05/17/2023 MARYRUTH, APPLE A ([id number]) [id number] Page 11 of 20 Electronic Signature(s) Signed: 07/24/2023 3:20:56 PM By: [name] PA-C Entered By: [name] on 07/24/2023 15:20:56 -------------------------------------------------------------------------------- Progress Note Details Patient Name: Date of Service: TERSA, FOTOPOULOS 07/24/2023 2:45 PM Medical Record Number:  [id number] Patient Account Number: [id number] Date of Birth/Sex: Treating RN: 23-Mar-1976 (47 y.o. [name] Primary Care Provider: [name] Other Clinician: [name] Referring Provider: Treating Provider/Extender: [name] in Treatment: 33 Subjective Chief Complaint Information obtained from Patient Left foot ulcer History of Present Illness (HPI) 01/18/18-She is here for initial evaluation of the left great toe ulcer. She is a poor historian in regards to timeframe in detail. She states approximately 4 weeks ago she lacerated her toe on something in the house. She followed up with her primary care who placed her on Bactrim and ultimately a second dose of Bactrim prior to coming to wound clinic. She states she has been treating the toe with peroxide, Betadine and a Band-Aid. She did not check her blood sugar this morning but checked it yesterday morning it was 327; she is unaware of a recent A1c and there are no current records. She saw Dr. she would've orthopedics last week for an old injury to the left ankle, she states he did not see her toe, nor did she bring it to his attention. She smokes approximately 1 pack cigarettes a day. Her social situation is concerning, she arrives this morning with her mother who appears extremely intoxicated/under the influence; her mother was asked to leave the room and be monitored by the patient's grandmother. The patient's aunt then accompanied the patient and the room throughout the rest of the appointment. We had a lengthy discussion regarding the deleterious effects of uncontrolled hyperglycemia and smoking as it relates to wound healing and overall health. She was strongly encouraged to decrease her smoking and get her diabetes under better control. She states she is currently on a diet and has cut down her [facility] consumption. The left toe is erythematous, macerated and slightly edematous with malodor  present. The edema in her left foot is below her baseline, there is no erythema streaking. We will treat her with Santyl, doxycycline; we have ordered and xray, culture and provided a Peg assist surgical shoe and cultured the wound. 01/25/18-She is here in follow-up evaluation for a left great toe ulcer and presents with an abscess to her suprapubic area. She states her blood sugars remain elevated, feeling "sick" and if levels are below 250, but she is trying. She has made no attempt to decrease her smoking stating that we "can't take away her food in her cigarettes". She has been compliant with offloading using the PEG assist you. She is using Santyl daily. the culture obtained last week grew staph aureus and Enterococcus faecalis; continues on the doxycycline and Augmentin was added on Monday. The suprapubic area has erythema, no femoral variation, purple  02/22/2023 No Yes N18.6 End stage renal disease 07/14/2023 No Yes Z99.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 07/14/2023 No Yes Inactive Problems Resolved Problems ICD-10 Code Description Active Date Resolved Date L97.512 Non-pressure chronic ulcer of other part of right foot with fat layer exposed 03/16/2022 03/16/2022 L97.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 of skin 03/16/2022 03/16/2022 W54.0XXA Bitten by dog, initial encounter 05/17/2023 05/17/2023 S81.801A Unspecified open wound, right lower leg, initial encounter 05/17/2023 05/17/2023 X91.478 Type 2 diabetes mellitus with other skin ulcer 05/17/2023 05/17/2023 MARYRUTH, APPLE A ([id number]) [id number] Page 11 of 20 Electronic Signature(s) Signed: 07/24/2023 3:20:56 PM By: [name] PA-C Entered By: [name] on 07/24/2023 15:20:56 -------------------------------------------------------------------------------- Progress Note Details Patient Name: Date of Service: TERSA, FOTOPOULOS 07/24/2023 2:45 PM Medical Record Number:  [id number] Patient Account Number: [id number] Date of Birth/Sex: Treating RN: 23-Mar-1976 (47 y.o. [name] Primary Care Provider: [name] Other Clinician: [name] Referring Provider: Treating Provider/Extender: [name] in Treatment: 33 Subjective Chief Complaint Information obtained from Patient Left foot ulcer History of Present Illness (HPI) 01/18/18-She is here for initial evaluation of the left great toe ulcer. She is a poor historian in regards to timeframe in detail. She states approximately 4 weeks ago she lacerated her toe on something in the house. She followed up with her primary care who placed her on Bactrim and ultimately a second dose of Bactrim prior to coming to wound clinic. She states she has been treating the toe with peroxide, Betadine and a Band-Aid. She did not check her blood sugar this morning but checked it yesterday morning it was 327; she is unaware of a recent A1c and there are no current records. She saw Dr. she would've orthopedics last week for an old injury to the left ankle, she states he did not see her toe, nor did she bring it to his attention. She smokes approximately 1 pack cigarettes a day. Her social situation is concerning, she arrives this morning with her mother who appears extremely intoxicated/under the influence; her mother was asked to leave the room and be monitored by the patient's grandmother. The patient's aunt then accompanied the patient and the room throughout the rest of the appointment. We had a lengthy discussion regarding the deleterious effects of uncontrolled hyperglycemia and smoking as it relates to wound healing and overall health. She was strongly encouraged to decrease her smoking and get her diabetes under better control. She states she is currently on a diet and has cut down her [facility] consumption. The left toe is erythematous, macerated and slightly edematous with malodor  present. The edema in her left foot is below her baseline, there is no erythema streaking. We will treat her with Santyl, doxycycline; we have ordered and xray, culture and provided a Peg assist surgical shoe and cultured the wound. 01/25/18-She is here in follow-up evaluation for a left great toe ulcer and presents with an abscess to her suprapubic area. She states her blood sugars remain elevated, feeling "sick" and if levels are below 250, but she is trying. She has made no attempt to decrease her smoking stating that we "can't take away her food in her cigarettes". She has been compliant with offloading using the PEG assist you. She is using Santyl daily. the culture obtained last week grew staph aureus and Enterococcus faecalis; continues on the doxycycline and Augmentin was added on Monday. The suprapubic area has erythema, no femoral variation, purple  NONNA, RENNINGER A ([id number]) [id number] Page 1 of 20 Visit Report for 07/24/2023 Chief Complaint Document Details Patient Name: Date of Service: RAHIMA, FLEISHMAN 07/24/2023 2:45 PM Medical Record Number: [id number] Patient Account Number: [id number] Date of Birth/Sex: Treating RN: [birth year] (47 y.o. [name] Primary Care Provider: [name] Other Clinician: [name] Referring Provider: Treating Provider/Extender: [name] Weeks in Treatment: 42 Information Obtained from: Patient Chief Complaint Left foot ulcer Electronic Signature(s) Signed: 07/24/2023 3:21:00 PM By: [name] PA-C Entered By: [name] on 07/24/2023 15:21:00 -------------------------------------------------------------------------------- HPI Details Patient Name: Date of Service: MERY, GUADALUPE 07/24/2023 2:45 PM Medical Record Number: [id number] Patient Account Number: [id number] Date of Birth/Sex: Treating RN: 1976-10-04 (47 y.o. [name] Primary Care Provider: [name] Other Clinician: [name] Referring Provider: Treating Provider/Extender: [name] in Treatment: 66 History of Present Illness HPI Description: 01/18/18-She is here for initial evaluation of the left great toe ulcer. She is a poor historian in regards to timeframe in detail. She states approximately 4 weeks ago she lacerated her toe on something in the house. She followed up with her primary care who placed her on Bactrim and ultimately a second dose of Bactrim prior to coming to wound clinic. She states she has been treating the toe with peroxide, Betadine and a Band-Aid. She did not check her blood sugar this morning but checked it yesterday morning it was 327; she is unaware of a recent A1c and there are no current records. She saw Dr. she would've orthopedics last week for an old injury to the left ankle, she states he did not see  her toe, nor did she bring it to his attention. She smokes approximately 1 pack cigarettes a day. Her social situation is concerning, she arrives this morning with her mother who appears extremely intoxicated/under the influence; her mother was asked to leave the room and be monitored by the patient's grandmother. The patient's aunt then accompanied the patient and the room throughout the rest of the appointment. We had a lengthy discussion regarding the deleterious effects of uncontrolled hyperglycemia and smoking as it relates to wound healing and overall health. She was strongly encouraged to decrease her smoking and get her diabetes under better control. She states she is currently on a diet and has cut down her [facility] consumption. The left toe is erythematous, macerated and slightly edematous with malodor present. The edema in her left foot is below her baseline, there is no erythema streaking. We will treat her with Santyl, doxycycline; we have ordered and xray, culture and provided a Peg assist surgical shoe and cultured the wound. 01/25/18-She is here in follow-up evaluation for a left great toe ulcer and presents with an abscess to her suprapubic area. She states her blood sugars remain elevated, feeling "sick" and if levels are below 250, but she is trying. She has made no attempt to decrease her smoking stating that we "can't take away her food in her cigarettes". She has been compliant with offloading using the PEG assist you. She is using Santyl daily. the culture obtained last week grew staph aureus and Enterococcus faecalis; continues on the doxycycline and Augmentin was added on Monday. The suprapubic area has erythema, no femoral variation, purple discoloration, minimal induration, was accessed with a cotton tip applicator with sanguinopurulent drainage, this was cultured, I suspect the MARCINE, GADWAY A ([id number]) [phone].pdf Page 2 of 20 current  antibiotic treatment will cover and we  here in the clinic. If anything worsens or changes patient will contact our office for additional recommendations. Electronic Signature(s) Signed: 07/24/2023 3:42:53 PM By: [name] PA-C Entered By: [name] on 07/24/2023 15:42:53 -------------------------------------------------------------------------------- Total Contact Cast Details Patient Name: Date of Service: ILDA, LASKIN 07/24/2023 2:45 PM Medical Record Number: [id number] Patient Account Number: [id number] Date of Birth/Sex: Treating RN: 06/22/1976 (47 y.o. [name] Primary Care Provider: [name] Other Clinician: [name] Referring Provider: Treating Provider/Extender: [name] Weeks in Treatment: 29 T Contact Cast Applied for Wound Assessment: otal Wound #12R Left,Medial,Plantar  Foot Performed By: Physician [name], PA-C Post Procedure Diagnosis Same as Pre-procedure Electronic Signature(s) Signed: 07/24/2023 4:50:01 PM By: [name] PA-C Entered By: [name] on 07/24/2023 15:24:38 -------------------------------------------------------------------------------- SuperBill Details Patient Name: Date of Service: [name] 07/24/2023 Medical Record Number: [id number] Patient Account Number: [id number] Date of Birth/Sex: Treating RN: [birth year] (47 y.o. 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 Z79.4 Long term (current) use of insulin N18.6 End stage renal disease Z99.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 Facility Procedures : CPT4 Code: [id number] Description: 29445 - APPLY TOTAL CONTACT LEG CAST ICD-10 Diagnosis Description 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 Modifier: ity Quantity: 1 Physician Procedures : CPT4 Code Description Modifier [id number] - WC PHYS APPLY TOTAL CONTACT CAST ICD-10 Diagnosis Description 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ity Quantity: 1 Electronic Signature(s) Signed: 07/24/2023 3:43:06 PM By: [name] PA-C Entered By: [name] on 07/24/2023 15:43:06  02/22/2023 No Yes N18.6 End stage renal disease 07/14/2023 No Yes Z99.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 07/14/2023 No Yes Inactive Problems Resolved Problems ICD-10 Code Description Active Date Resolved Date L97.512 Non-pressure chronic ulcer of other part of right foot with fat layer exposed 03/16/2022 03/16/2022 L97.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 of skin 03/16/2022 03/16/2022 W54.0XXA Bitten by dog, initial encounter 05/17/2023 05/17/2023 S81.801A Unspecified open wound, right lower leg, initial encounter 05/17/2023 05/17/2023 X91.478 Type 2 diabetes mellitus with other skin ulcer 05/17/2023 05/17/2023 MARYRUTH, APPLE A ([id number]) [id number] Page 11 of 20 Electronic Signature(s) Signed: 07/24/2023 3:20:56 PM By: [name] PA-C Entered By: [name] on 07/24/2023 15:20:56 -------------------------------------------------------------------------------- Progress Note Details Patient Name: Date of Service: TERSA, FOTOPOULOS 07/24/2023 2:45 PM Medical Record Number:  [id number] Patient Account Number: [id number] Date of Birth/Sex: Treating RN: 23-Mar-1976 (47 y.o. [name] Primary Care Provider: [name] Other Clinician: [name] Referring Provider: Treating Provider/Extender: [name] in Treatment: 33 Subjective Chief Complaint Information obtained from Patient Left foot ulcer History of Present Illness (HPI) 01/18/18-She is here for initial evaluation of the left great toe ulcer. She is a poor historian in regards to timeframe in detail. She states approximately 4 weeks ago she lacerated her toe on something in the house. She followed up with her primary care who placed her on Bactrim and ultimately a second dose of Bactrim prior to coming to wound clinic. She states she has been treating the toe with peroxide, Betadine and a Band-Aid. She did not check her blood sugar this morning but checked it yesterday morning it was 327; she is unaware of a recent A1c and there are no current records. She saw Dr. she would've orthopedics last week for an old injury to the left ankle, she states he did not see her toe, nor did she bring it to his attention. She smokes approximately 1 pack cigarettes a day. Her social situation is concerning, she arrives this morning with her mother who appears extremely intoxicated/under the influence; her mother was asked to leave the room and be monitored by the patient's grandmother. The patient's aunt then accompanied the patient and the room throughout the rest of the appointment. We had a lengthy discussion regarding the deleterious effects of uncontrolled hyperglycemia and smoking as it relates to wound healing and overall health. She was strongly encouraged to decrease her smoking and get her diabetes under better control. She states she is currently on a diet and has cut down her [facility] consumption. The left toe is erythematous, macerated and slightly edematous with malodor  present. The edema in her left foot is below her baseline, there is no erythema streaking. We will treat her with Santyl, doxycycline; we have ordered and xray, culture and provided a Peg assist surgical shoe and cultured the wound. 01/25/18-She is here in follow-up evaluation for a left great toe ulcer and presents with an abscess to her suprapubic area. She states her blood sugars remain elevated, feeling "sick" and if levels are below 250, but she is trying. She has made no attempt to decrease her smoking stating that we "can't take away her food in her cigarettes". She has been compliant with offloading using the PEG assist you. She is using Santyl daily. the culture obtained last week grew staph aureus and Enterococcus faecalis; continues on the doxycycline and Augmentin was added on Monday. The suprapubic area has erythema, no femoral variation, purple  this is much better than last week no debridement even necessary today. We may have to perform some slight debridement come next week we will see how things go. Monitor for any signs of worsening overall and obviously if anything changes she should be letting us know. Electronic Signature(s) Signed: 07/24/2023 3:41:50 PM By: [name] PA-C Entered By: [name] on 07/24/2023 15:41:50 -------------------------------------------------------------------------------- Physical Exam Details Patient Name: Date of Service: ISHITA, MCNERNEY 07/24/2023 2:45 PM Medical Record Number: [id number] Patient Account Number: [id number] Date of Birth/Sex: Treating RN: 02-14-1976 (47 y.o. [name] Primary Care Provider: [name] Other Clinician: [name] Referring Provider: Treating Provider/Extender: [name] Weeks in Treatment: 87 Constitutional Thin and well-hydrated in no acute distress. Respiratory normal breathing without difficulty. Psychiatric this patient is able to make decisions and demonstrates good insight into disease process. Alert and Oriented x 3. pleasant and cooperative. Notes Upon inspection patient's wound bed actually showed signs of good granulation and epithelization at this point. Fortunately I do not see worsening I think that the total contact cast is doing quite well I would recommend that we continue with the plan currently. any signs of WAKEELAH, SOLAN A ([id number]) [id number] Page 9 of 20 Electronic Signature(s) Signed: 07/24/2023 3:42:06 PM By: [name] PA-C Entered By: [name] on 07/24/2023  15:42:06 -------------------------------------------------------------------------------- Physician Orders Details Patient Name: Date of Service: CHARLEA, NARDO 07/24/2023 2:45 PM Medical Record Number: [id number] Patient Account Number: [id number] Date of Birth/Sex: Treating RN: 1976-08-14 (47 y.o. 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 Bathing/ Shower/ Hygiene May shower; gently cleanse wound with antibacterial soap, rinse and pat dry prior to dressing wounds No tub bath. Off-Loading Total Contact Cast to Left Lower Extremity Open toe surgical shoe - right foot Wound Treatment Wound #12R - Foot Wound Laterality: Plantar, Left, Medial Cleanser: Vashe 5.8 (oz) Discharge Instructions: Use vashe 5.8 (oz) as directed Topical: Gentamicin Discharge Instructions: Apply as directed by provider. Prim Dressing: Hydrofera Blue Ready Transfer Foam, 2.5x2.5 (in/in) ary Discharge Instructions: Apply Hydrofera Blue Ready to wound bed as directed Secondary Dressing: ABD Pad 5x9 (in/in) Discharge Instructions: Cover with ABD pad Electronic Signature(s) Signed: 07/24/2023 4:50:01 PM By: [name] PA-C Entered By: [name] on 07/24/2023 15:25:15 Earmon Phoenix A ([id number]) [id number] Page 10 of  20 -------------------------------------------------------------------------------- Problem List Details Patient Name: Date of Service: LAVANA, HUCKEBA 07/24/2023 2:45 PM Medical Record Number: [id number] Patient Account Number: [id number] Date of Birth/Sex: Treating RN: 1976/07/02 (47 y.o. 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 We may have to perform some slight debridement come next week we will see how things go. Monitor for any signs of worsening overall and obviously if anything changes she should be letting us know. Electronic Signature(s) Signed: 07/24/2023 3:41:50 PM By: [name] PA-C Entered By: [name] on 07/24/2023 15:41:50 -------------------------------------------------------------------------------- Physical Exam Details Patient Name: Date of Service: ISHITA, MCNERNEY 07/24/2023 2:45 PM Medical Record Number: [id number] Patient Account Number: [id number] Date of Birth/Sex: Treating RN: 02-14-1976 (47 y.o. [name] Primary Care Provider: [name] Other Clinician: [name] Referring Provider: Treating Provider/Extender: [name] Weeks in Treatment: 87 Constitutional Thin and well-hydrated in no acute distress. Respiratory normal breathing without difficulty. Psychiatric this patient is able to make decisions and demonstrates good insight into disease process. Alert and Oriented x 3. pleasant and cooperative. Notes Upon inspection patient's wound bed actually showed signs of good granulation and epithelization at this point. Fortunately I do not see worsening I think that the total contact cast is doing quite well I would recommend that we continue with the plan currently. any signs of WAKEELAH, SOLAN A ([id number]) [id number] Page 9 of 20 Electronic Signature(s) Signed: 07/24/2023 3:42:06 PM By: [name] PA-C Entered By: [name] on 07/24/2023  15:42:06 -------------------------------------------------------------------------------- Physician Orders Details Patient Name: Date of Service: CHARLEA, NARDO 07/24/2023 2:45 PM Medical Record Number: [id number] Patient Account Number: [id number] Date of Birth/Sex: Treating RN: 1976-08-14 (47 y.o. 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 Bathing/ Shower/ Hygiene May shower; gently cleanse wound with antibacterial soap, rinse and pat dry prior to dressing wounds No tub bath. Off-Loading Total Contact Cast to Left Lower Extremity Open toe surgical shoe - right foot Wound Treatment Wound #12R - Foot Wound Laterality: Plantar, Left, Medial Cleanser: Vashe 5.8 (oz) Discharge Instructions: Use vashe 5.8 (oz) as directed Topical: Gentamicin Discharge Instructions: Apply as directed by provider. Prim Dressing: Hydrofera Blue Ready Transfer Foam, 2.5x2.5 (in/in) ary Discharge Instructions: Apply Hydrofera Blue Ready to wound bed as directed Secondary Dressing: ABD Pad 5x9 (in/in) Discharge Instructions: Cover with ABD pad Electronic Signature(s) Signed: 07/24/2023 4:50:01 PM By: [name] PA-C Entered By: [name] on 07/24/2023 15:25:15 Earmon Phoenix A ([id number]) [id number] Page 10 of  20 -------------------------------------------------------------------------------- Problem List Details Patient Name: Date of Service: LAVANA, HUCKEBA 07/24/2023 2:45 PM Medical Record Number: [id number] Patient Account Number: [id number] Date of Birth/Sex: Treating RN: 1976/07/02 (47 y.o. 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 We may have to perform some slight debridement come next week we will see how things go. Monitor for any signs of worsening overall and obviously if anything changes she should be letting us know. Electronic Signature(s) Signed: 07/24/2023 3:41:50 PM By: [name] PA-C Entered By: [name] on 07/24/2023 15:41:50 -------------------------------------------------------------------------------- Physical Exam Details Patient Name: Date of Service: ISHITA, MCNERNEY 07/24/2023 2:45 PM Medical Record Number: [id number] Patient Account Number: [id number] Date of Birth/Sex: Treating RN: 02-14-1976 (47 y.o. [name] Primary Care Provider: [name] Other Clinician: [name] Referring Provider: Treating Provider/Extender: [name] Weeks in Treatment: 87 Constitutional Thin and well-hydrated in no acute distress. Respiratory normal breathing without difficulty. Psychiatric this patient is able to make decisions and demonstrates good insight into disease process. Alert and Oriented x 3. pleasant and cooperative. Notes Upon inspection patient's wound bed actually showed signs of good granulation and epithelization at this point. Fortunately I do not see worsening I think that the total contact cast is doing quite well I would recommend that we continue with the plan currently. any signs of WAKEELAH, SOLAN A ([id number]) [id number] Page 9 of 20 Electronic Signature(s) Signed: 07/24/2023 3:42:06 PM By: [name] PA-C Entered By: [name] on 07/24/2023  15:42:06 -------------------------------------------------------------------------------- Physician Orders Details Patient Name: Date of Service: CHARLEA, NARDO 07/24/2023 2:45 PM Medical Record Number: [id number] Patient Account Number: [id number] Date of Birth/Sex: Treating RN: 1976-08-14 (47 y.o. 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 Bathing/ Shower/ Hygiene May shower; gently cleanse wound with antibacterial soap, rinse and pat dry prior to dressing wounds No tub bath. Off-Loading Total Contact Cast to Left Lower Extremity Open toe surgical shoe - right foot Wound Treatment Wound #12R - Foot Wound Laterality: Plantar, Left, Medial Cleanser: Vashe 5.8 (oz) Discharge Instructions: Use vashe 5.8 (oz) as directed Topical: Gentamicin Discharge Instructions: Apply as directed by provider. Prim Dressing: Hydrofera Blue Ready Transfer Foam, 2.5x2.5 (in/in) ary Discharge Instructions: Apply Hydrofera Blue Ready to wound bed as directed Secondary Dressing: ABD Pad 5x9 (in/in) Discharge Instructions: Cover with ABD pad Electronic Signature(s) Signed: 07/24/2023 4:50:01 PM By: [name] PA-C Entered By: [name] on 07/24/2023 15:25:15 Earmon Phoenix A ([id number]) [id number] Page 10 of  20 -------------------------------------------------------------------------------- Problem List Details Patient Name: Date of Service: LAVANA, HUCKEBA 07/24/2023 2:45 PM Medical Record Number: [id number] Patient Account Number: [id number] Date of Birth/Sex: Treating RN: 1976/07/02 (47 y.o. 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 03/16/2022 No Yes severity E11.42 Type 2 diabetes mellitus with diabetic polyneuropathy 03/16/2022 No Yes Z79.4 Long term (current) use of insulin  02/22/2023 No Yes N18.6 End stage renal disease 07/14/2023 No Yes Z99.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 07/14/2023 No Yes Inactive Problems Resolved Problems ICD-10 Code Description Active Date Resolved Date L97.512 Non-pressure chronic ulcer of other part of right foot with fat layer exposed 03/16/2022 03/16/2022 L97.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 of skin 03/16/2022 03/16/2022 W54.0XXA Bitten by dog, initial encounter 05/17/2023 05/17/2023 S81.801A Unspecified open wound, right lower leg, initial encounter 05/17/2023 05/17/2023 X91.478 Type 2 diabetes mellitus with other skin ulcer 05/17/2023 05/17/2023 MARYRUTH, APPLE A ([id number]) [id number] Page 11 of 20 Electronic Signature(s) Signed: 07/24/2023 3:20:56 PM By: [name] PA-C Entered By: [name] on 07/24/2023 15:20:56 -------------------------------------------------------------------------------- Progress Note Details Patient Name: Date of Service: TERSA, FOTOPOULOS 07/24/2023 2:45 PM Medical Record Number:  [id number] Patient Account Number: [id number] Date of Birth/Sex: Treating RN: 23-Mar-1976 (47 y.o. [name] Primary Care Provider: [name] Other Clinician: [name] Referring Provider: Treating Provider/Extender: [name] in Treatment: 33 Subjective Chief Complaint Information obtained from Patient Left foot ulcer History of Present Illness (HPI) 01/18/18-She is here for initial evaluation of the left great toe ulcer. She is a poor historian in regards to timeframe in detail. She states approximately 4 weeks ago she lacerated her toe on something in the house. She followed up with her primary care who placed her on Bactrim and ultimately a second dose of Bactrim prior to coming to wound clinic. She states she has been treating the toe with peroxide, Betadine and a Band-Aid. She did not check her blood sugar this morning but checked it yesterday morning it was 327; she is unaware of a recent A1c and there are no current records. She saw Dr. she would've orthopedics last week for an old injury to the left ankle, she states he did not see her toe, nor did she bring it to his attention. She smokes approximately 1 pack cigarettes a day. Her social situation is concerning, she arrives this morning with her mother who appears extremely intoxicated/under the influence; her mother was asked to leave the room and be monitored by the patient's grandmother. The patient's aunt then accompanied the patient and the room throughout the rest of the appointment. We had a lengthy discussion regarding the deleterious effects of uncontrolled hyperglycemia and smoking as it relates to wound healing and overall health. She was strongly encouraged to decrease her smoking and get her diabetes under better control. She states she is currently on a diet and has cut down her [facility] consumption. The left toe is erythematous, macerated and slightly edematous with malodor  present. The edema in her left foot is below her baseline, there is no erythema streaking. We will treat her with Santyl, doxycycline; we have ordered and xray, culture and provided a Peg assist surgical shoe and cultured the wound. 01/25/18-She is here in follow-up evaluation for a left great toe ulcer and presents with an abscess to her suprapubic area. She states her blood sugars remain elevated, feeling "sick" and if levels are below 250, but she is trying. She has made no attempt to decrease her smoking stating that we "can't take away her food in her cigarettes". She has been compliant with offloading using the PEG assist you. She is using Santyl daily. the culture obtained last week grew staph aureus and Enterococcus faecalis; continues on the doxycycline and Augmentin was added on Monday. The suprapubic area has erythema, no femoral variation, purple  this is much better than last week no debridement even necessary today. We may have to perform some slight debridement come next week we will see how things go. Monitor for any signs of worsening overall and obviously if anything changes she should be letting us know. Electronic Signature(s) Signed: 07/24/2023 3:41:50 PM By: [name] PA-C Entered By: [name] on 07/24/2023 15:41:50 -------------------------------------------------------------------------------- Physical Exam Details Patient Name: Date of Service: ISHITA, MCNERNEY 07/24/2023 2:45 PM Medical Record Number: [id number] Patient Account Number: [id number] Date of Birth/Sex: Treating RN: 02-14-1976 (47 y.o. [name] Primary Care Provider: [name] Other Clinician: [name] Referring Provider: Treating Provider/Extender: [name] Weeks in Treatment: 87 Constitutional Thin and well-hydrated in no acute distress. Respiratory normal breathing without difficulty. Psychiatric this patient is able to make decisions and demonstrates good insight into disease process. Alert and Oriented x 3. pleasant and cooperative. Notes Upon inspection patient's wound bed actually showed signs of good granulation and epithelization at this point. Fortunately I do not see worsening I think that the total contact cast is doing quite well I would recommend that we continue with the plan currently. any signs of WAKEELAH, SOLAN A ([id number]) [id number] Page 9 of 20 Electronic Signature(s) Signed: 07/24/2023 3:42:06 PM By: [name] PA-C Entered By: [name] on 07/24/2023  15:42:06 -------------------------------------------------------------------------------- Physician Orders Details Patient Name: Date of Service: CHARLEA, NARDO 07/24/2023 2:45 PM Medical Record Number: [id number] Patient Account Number: [id number] Date of Birth/Sex: Treating RN: 1976-08-14 (47 y.o. 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 Bathing/ Shower/ Hygiene May shower; gently cleanse wound with antibacterial soap, rinse and pat dry prior to dressing wounds No tub bath. Off-Loading Total Contact Cast to Left Lower Extremity Open toe surgical shoe - right foot Wound Treatment Wound #12R - Foot Wound Laterality: Plantar, Left, Medial Cleanser: Vashe 5.8 (oz) Discharge Instructions: Use vashe 5.8 (oz) as directed Topical: Gentamicin Discharge Instructions: Apply as directed by provider. Prim Dressing: Hydrofera Blue Ready Transfer Foam, 2.5x2.5 (in/in) ary Discharge Instructions: Apply Hydrofera Blue Ready to wound bed as directed Secondary Dressing: ABD Pad 5x9 (in/in) Discharge Instructions: Cover with ABD pad Electronic Signature(s) Signed: 07/24/2023 4:50:01 PM By: [name] PA-C Entered By: [name] on 07/24/2023 15:25:15 Earmon Phoenix A ([id number]) [id number] Page 10 of  20 -------------------------------------------------------------------------------- Problem List Details Patient Name: Date of Service: LAVANA, HUCKEBA 07/24/2023 2:45 PM Medical Record Number: [id number] Patient Account Number: [id number] Date of Birth/Sex: Treating RN: 1976/07/02 (47 y.o. 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 [name] Primary Care Provider: [name] Other Clinician: [name] Referring Provider: Treating Provider/Extender: [name] Weeks in Treatment: 42 Information Obtained from: Patient Chief Complaint Left foot ulcer Electronic Signature(s) Signed: 07/24/2023 3:21:00 PM By: [name] PA-C Entered By: [name] on 07/24/2023 15:21:00 -------------------------------------------------------------------------------- HPI Details Patient Name: Date of Service: MERY, GUADALUPE 07/24/2023 2:45 PM Medical Record Number: [id number] Patient Account Number: [id number] Date of Birth/Sex: Treating RN: 1976-10-04 (47 y.o. [name] Primary Care Provider: [name] Other Clinician: [name] Referring Provider: Treating Provider/Extender: [name] in Treatment: 66 History of Present Illness HPI Description: 01/18/18-She is here for initial evaluation of the left great toe ulcer. She is a poor historian in regards to timeframe in detail. She states approximately 4 weeks ago she lacerated her toe on something in the house. She followed up with her primary care who placed her on Bactrim and ultimately a second dose of Bactrim prior to coming to wound clinic. She states she has been treating the toe with peroxide, Betadine and a Band-Aid. She did not check her blood sugar this morning but checked it yesterday morning it was 327; she is unaware of a recent A1c and there are no current records. She saw Dr. she would've orthopedics last week for an old injury to the left ankle, she states he did not see  her toe, nor did she bring it to his attention. She smokes approximately 1 pack cigarettes a day. Her social situation is concerning, she arrives this morning with her mother who appears extremely intoxicated/under the influence; her mother was asked to leave the room and be monitored by the patient's grandmother. The patient's aunt then accompanied the patient and the room throughout the rest of the appointment. We had a lengthy discussion regarding the deleterious effects of uncontrolled hyperglycemia and smoking as it relates to wound healing and overall health. She was strongly encouraged to decrease her smoking and get her diabetes under better control. She states she is currently on a diet and has cut down her [facility] consumption. The left toe is erythematous, macerated and slightly edematous with malodor present. The edema in her left foot is below her baseline, there is no erythema streaking. We will treat her with Santyl, doxycycline; we have ordered and xray, culture and provided a Peg assist surgical shoe and cultured the wound. 01/25/18-She is here in follow-up evaluation for a left great toe ulcer and presents with an abscess to her suprapubic area. She states her blood sugars remain elevated, feeling "sick" and if levels are below 250, but she is trying. She has made no attempt to decrease her smoking stating that we "can't take away her food in her cigarettes". She has been compliant with offloading using the PEG assist you. She is using Santyl daily. the culture obtained last week grew staph aureus and Enterococcus faecalis; continues on the doxycycline and Augmentin was added on Monday. The suprapubic area has erythema, no femoral variation, purple discoloration, minimal induration, was accessed with a cotton tip applicator with sanguinopurulent drainage, this was cultured, I suspect the MARCINE, GADWAY A ([id number]) [phone].pdf Page 2 of 20 current  antibiotic treatment will cover and we  02/22/2023 No Yes N18.6 End stage renal disease 07/14/2023 No Yes Z99.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 07/14/2023 No Yes Inactive Problems Resolved Problems ICD-10 Code Description Active Date Resolved Date L97.512 Non-pressure chronic ulcer of other part of right foot with fat layer exposed 03/16/2022 03/16/2022 L97.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 of skin 03/16/2022 03/16/2022 W54.0XXA Bitten by dog, initial encounter 05/17/2023 05/17/2023 S81.801A Unspecified open wound, right lower leg, initial encounter 05/17/2023 05/17/2023 X91.478 Type 2 diabetes mellitus with other skin ulcer 05/17/2023 05/17/2023 MARYRUTH, APPLE A ([id number]) [id number] Page 11 of 20 Electronic Signature(s) Signed: 07/24/2023 3:20:56 PM By: [name] PA-C Entered By: [name] on 07/24/2023 15:20:56 -------------------------------------------------------------------------------- Progress Note Details Patient Name: Date of Service: TERSA, FOTOPOULOS 07/24/2023 2:45 PM Medical Record Number:  [id number] Patient Account Number: [id number] Date of Birth/Sex: Treating RN: 23-Mar-1976 (47 y.o. [name] Primary Care Provider: [name] Other Clinician: [name] Referring Provider: Treating Provider/Extender: [name] in Treatment: 33 Subjective Chief Complaint Information obtained from Patient Left foot ulcer History of Present Illness (HPI) 01/18/18-She is here for initial evaluation of the left great toe ulcer. She is a poor historian in regards to timeframe in detail. She states approximately 4 weeks ago she lacerated her toe on something in the house. She followed up with her primary care who placed her on Bactrim and ultimately a second dose of Bactrim prior to coming to wound clinic. She states she has been treating the toe with peroxide, Betadine and a Band-Aid. She did not check her blood sugar this morning but checked it yesterday morning it was 327; she is unaware of a recent A1c and there are no current records. She saw Dr. she would've orthopedics last week for an old injury to the left ankle, she states he did not see her toe, nor did she bring it to his attention. She smokes approximately 1 pack cigarettes a day. Her social situation is concerning, she arrives this morning with her mother who appears extremely intoxicated/under the influence; her mother was asked to leave the room and be monitored by the patient's grandmother. The patient's aunt then accompanied the patient and the room throughout the rest of the appointment. We had a lengthy discussion regarding the deleterious effects of uncontrolled hyperglycemia and smoking as it relates to wound healing and overall health. She was strongly encouraged to decrease her smoking and get her diabetes under better control. She states she is currently on a diet and has cut down her [facility] consumption. The left toe is erythematous, macerated and slightly edematous with malodor  present. The edema in her left foot is below her baseline, there is no erythema streaking. We will treat her with Santyl, doxycycline; we have ordered and xray, culture and provided a Peg assist surgical shoe and cultured the wound. 01/25/18-She is here in follow-up evaluation for a left great toe ulcer and presents with an abscess to her suprapubic area. She states her blood sugars remain elevated, feeling "sick" and if levels are below 250, but she is trying. She has made no attempt to decrease her smoking stating that we "can't take away her food in her cigarettes". She has been compliant with offloading using the PEG assist you. She is using Santyl daily. the culture obtained last week grew staph aureus and Enterococcus faecalis; continues on the doxycycline and Augmentin was added on Monday. The suprapubic area has erythema, no femoral variation, purple  this is much better than last week no debridement even necessary today. We may have to perform some slight debridement come next week we will see how things go. Monitor for any signs of worsening overall and obviously if anything changes she should be letting us know. Electronic Signature(s) Signed: 07/24/2023 3:41:50 PM By: [name] PA-C Entered By: [name] on 07/24/2023 15:41:50 -------------------------------------------------------------------------------- Physical Exam Details Patient Name: Date of Service: ISHITA, MCNERNEY 07/24/2023 2:45 PM Medical Record Number: [id number] Patient Account Number: [id number] Date of Birth/Sex: Treating RN: 02-14-1976 (47 y.o. [name] Primary Care Provider: [name] Other Clinician: [name] Referring Provider: Treating Provider/Extender: [name] Weeks in Treatment: 87 Constitutional Thin and well-hydrated in no acute distress. Respiratory normal breathing without difficulty. Psychiatric this patient is able to make decisions and demonstrates good insight into disease process. Alert and Oriented x 3. pleasant and cooperative. Notes Upon inspection patient's wound bed actually showed signs of good granulation and epithelization at this point. Fortunately I do not see worsening I think that the total contact cast is doing quite well I would recommend that we continue with the plan currently. any signs of WAKEELAH, SOLAN A ([id number]) [id number] Page 9 of 20 Electronic Signature(s) Signed: 07/24/2023 3:42:06 PM By: [name] PA-C Entered By: [name] on 07/24/2023  15:42:06 -------------------------------------------------------------------------------- Physician Orders Details Patient Name: Date of Service: CHARLEA, NARDO 07/24/2023 2:45 PM Medical Record Number: [id number] Patient Account Number: [id number] Date of Birth/Sex: Treating RN: 1976-08-14 (47 y.o. 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 Bathing/ Shower/ Hygiene May shower; gently cleanse wound with antibacterial soap, rinse and pat dry prior to dressing wounds No tub bath. Off-Loading Total Contact Cast to Left Lower Extremity Open toe surgical shoe - right foot Wound Treatment Wound #12R - Foot Wound Laterality: Plantar, Left, Medial Cleanser: Vashe 5.8 (oz) Discharge Instructions: Use vashe 5.8 (oz) as directed Topical: Gentamicin Discharge Instructions: Apply as directed by provider. Prim Dressing: Hydrofera Blue Ready Transfer Foam, 2.5x2.5 (in/in) ary Discharge Instructions: Apply Hydrofera Blue Ready to wound bed as directed Secondary Dressing: ABD Pad 5x9 (in/in) Discharge Instructions: Cover with ABD pad Electronic Signature(s) Signed: 07/24/2023 4:50:01 PM By: [name] PA-C Entered By: [name] on 07/24/2023 15:25:15 Earmon Phoenix A ([id number]) [id number] Page 10 of  20 -------------------------------------------------------------------------------- Problem List Details Patient Name: Date of Service: LAVANA, HUCKEBA 07/24/2023 2:45 PM Medical Record Number: [id number] Patient Account Number: [id number] Date of Birth/Sex: Treating RN: 1976/07/02 (47 y.o. 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 03/16/2022 No Yes severity E11.42 Type 2 diabetes mellitus with diabetic polyneuropathy 03/16/2022 No Yes Z79.4 Long term (current) use of insulin  here in the clinic. If anything worsens or changes patient will contact our office for additional recommendations. Electronic Signature(s) Signed: 07/24/2023 3:42:53 PM By: [name] PA-C Entered By: [name] on 07/24/2023 15:42:53 -------------------------------------------------------------------------------- Total Contact Cast Details Patient Name: Date of Service: ILDA, LASKIN 07/24/2023 2:45 PM Medical Record Number: [id number] Patient Account Number: [id number] Date of Birth/Sex: Treating RN: 06/22/1976 (47 y.o. [name] Primary Care Provider: [name] Other Clinician: [name] Referring Provider: Treating Provider/Extender: [name] Weeks in Treatment: 29 T Contact Cast Applied for Wound Assessment: otal Wound #12R Left,Medial,Plantar  Foot Performed By: Physician [name], PA-C Post Procedure Diagnosis Same as Pre-procedure Electronic Signature(s) Signed: 07/24/2023 4:50:01 PM By: [name] PA-C Entered By: [name] on 07/24/2023 15:24:38 -------------------------------------------------------------------------------- SuperBill Details Patient Name: Date of Service: [name] 07/24/2023 Medical Record Number: [id number] Patient Account Number: [id number] Date of Birth/Sex: Treating RN: [birth year] (47 y.o. 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 Z79.4 Long term (current) use of insulin N18.6 End stage renal disease Z99.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 Facility Procedures : CPT4 Code: 08657846 Description: 29445 - APPLY TOTAL CONTACT LEG CAST ICD-10 Diagnosis Description 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 Modifier: ity Quantity: 1 Physician Procedures : CPT4 Code Description Modifier [id number] 41324 - WC PHYS APPLY TOTAL CONTACT CAST ICD-10 Diagnosis Description 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ity Quantity: 1 Electronic Signature(s) Signed: 07/24/2023 3:43:06 PM By: [name] PA-C Entered By: [name] on 07/24/2023 15:43:06  here in the clinic. If anything worsens or changes patient will contact our office for additional recommendations. Electronic Signature(s) Signed: 07/24/2023 3:42:53 PM By: [name] PA-C Entered By: [name] on 07/24/2023 15:42:53 -------------------------------------------------------------------------------- Total Contact Cast Details Patient Name: Date of Service: ILDA, LASKIN 07/24/2023 2:45 PM Medical Record Number: [id number] Patient Account Number: [id number] Date of Birth/Sex: Treating RN: 06/22/1976 (47 y.o. [name] Primary Care Provider: [name] Other Clinician: [name] Referring Provider: Treating Provider/Extender: [name] Weeks in Treatment: 29 T Contact Cast Applied for Wound Assessment: otal Wound #12R Left,Medial,Plantar  Foot Performed By: Physician [name], PA-C Post Procedure Diagnosis Same as Pre-procedure Electronic Signature(s) Signed: 07/24/2023 4:50:01 PM By: [name] PA-C Entered By: [name] on 07/24/2023 15:24:38 -------------------------------------------------------------------------------- SuperBill Details Patient Name: Date of Service: [name] 07/24/2023 Medical Record Number: [id number] Patient Account Number: [id number] Date of Birth/Sex: Treating RN: [birth year] (47 y.o. 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 Z79.4 Long term (current) use of insulin N18.6 End stage renal disease Z99.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 Facility Procedures : CPT4 Code: 08657846 Description: 29445 - APPLY TOTAL CONTACT LEG CAST ICD-10 Diagnosis Description 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 Modifier: ity Quantity: 1 Physician Procedures : CPT4 Code Description Modifier [id number] 41324 - WC PHYS APPLY TOTAL CONTACT CAST ICD-10 Diagnosis Description 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ity Quantity: 1 Electronic Signature(s) Signed: 07/24/2023 3:43:06 PM By: [name] PA-C Entered By: [name] on 07/24/2023 15:43:06  02/22/2023 No Yes N18.6 End stage renal disease 07/14/2023 No Yes Z99.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 07/14/2023 No Yes Inactive Problems Resolved Problems ICD-10 Code Description Active Date Resolved Date L97.512 Non-pressure chronic ulcer of other part of right foot with fat layer exposed 03/16/2022 03/16/2022 L97.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 of skin 03/16/2022 03/16/2022 W54.0XXA Bitten by dog, initial encounter 05/17/2023 05/17/2023 S81.801A Unspecified open wound, right lower leg, initial encounter 05/17/2023 05/17/2023 X91.478 Type 2 diabetes mellitus with other skin ulcer 05/17/2023 05/17/2023 MARYRUTH, APPLE A ([id number]) [id number] Page 11 of 20 Electronic Signature(s) Signed: 07/24/2023 3:20:56 PM By: [name] PA-C Entered By: [name] on 07/24/2023 15:20:56 -------------------------------------------------------------------------------- Progress Note Details Patient Name: Date of Service: TERSA, FOTOPOULOS 07/24/2023 2:45 PM Medical Record Number:  [id number] Patient Account Number: [id number] Date of Birth/Sex: Treating RN: 23-Mar-1976 (47 y.o. [name] Primary Care Provider: [name] Other Clinician: [name] Referring Provider: Treating Provider/Extender: [name] in Treatment: 33 Subjective Chief Complaint Information obtained from Patient Left foot ulcer History of Present Illness (HPI) 01/18/18-She is here for initial evaluation of the left great toe ulcer. She is a poor historian in regards to timeframe in detail. She states approximately 4 weeks ago she lacerated her toe on something in the house. She followed up with her primary care who placed her on Bactrim and ultimately a second dose of Bactrim prior to coming to wound clinic. She states she has been treating the toe with peroxide, Betadine and a Band-Aid. She did not check her blood sugar this morning but checked it yesterday morning it was 327; she is unaware of a recent A1c and there are no current records. She saw Dr. she would've orthopedics last week for an old injury to the left ankle, she states he did not see her toe, nor did she bring it to his attention. She smokes approximately 1 pack cigarettes a day. Her social situation is concerning, she arrives this morning with her mother who appears extremely intoxicated/under the influence; her mother was asked to leave the room and be monitored by the patient's grandmother. The patient's aunt then accompanied the patient and the room throughout the rest of the appointment. We had a lengthy discussion regarding the deleterious effects of uncontrolled hyperglycemia and smoking as it relates to wound healing and overall health. She was strongly encouraged to decrease her smoking and get her diabetes under better control. She states she is currently on a diet and has cut down her [facility] consumption. The left toe is erythematous, macerated and slightly edematous with malodor  present. The edema in her left foot is below her baseline, there is no erythema streaking. We will treat her with Santyl, doxycycline; we have ordered and xray, culture and provided a Peg assist surgical shoe and cultured the wound. 01/25/18-She is here in follow-up evaluation for a left great toe ulcer and presents with an abscess to her suprapubic area. She states her blood sugars remain elevated, feeling "sick" and if levels are below 250, but she is trying. She has made no attempt to decrease her smoking stating that we "can't take away her food in her cigarettes". She has been compliant with offloading using the PEG assist you. She is using Santyl daily. the culture obtained last week grew staph aureus and Enterococcus faecalis; continues on the doxycycline and Augmentin was added on Monday. The suprapubic area has erythema, no femoral variation, purple  NONNA, RENNINGER A ([id number]) [id number] Page 1 of 20 Visit Report for 07/24/2023 Chief Complaint Document Details Patient Name: Date of Service: RAHIMA, FLEISHMAN 07/24/2023 2:45 PM Medical Record Number: [id number] Patient Account Number: [id number] Date of Birth/Sex: Treating RN: [birth year] (47 y.o. [name] Primary Care Provider: [name] Other Clinician: [name] Referring Provider: Treating Provider/Extender: [name] Weeks in Treatment: 42 Information Obtained from: Patient Chief Complaint Left foot ulcer Electronic Signature(s) Signed: 07/24/2023 3:21:00 PM By: [name] PA-C Entered By: [name] on 07/24/2023 15:21:00 -------------------------------------------------------------------------------- HPI Details Patient Name: Date of Service: MERY, GUADALUPE 07/24/2023 2:45 PM Medical Record Number: [id number] Patient Account Number: [id number] Date of Birth/Sex: Treating RN: 1976-10-04 (47 y.o. [name] Primary Care Provider: [name] Other Clinician: [name] Referring Provider: Treating Provider/Extender: [name] in Treatment: 66 History of Present Illness HPI Description: 01/18/18-She is here for initial evaluation of the left great toe ulcer. She is a poor historian in regards to timeframe in detail. She states approximately 4 weeks ago she lacerated her toe on something in the house. She followed up with her primary care who placed her on Bactrim and ultimately a second dose of Bactrim prior to coming to wound clinic. She states she has been treating the toe with peroxide, Betadine and a Band-Aid. She did not check her blood sugar this morning but checked it yesterday morning it was 327; she is unaware of a recent A1c and there are no current records. She saw Dr. she would've orthopedics last week for an old injury to the left ankle, she states he did not see  her toe, nor did she bring it to his attention. She smokes approximately 1 pack cigarettes a day. Her social situation is concerning, she arrives this morning with her mother who appears extremely intoxicated/under the influence; her mother was asked to leave the room and be monitored by the patient's grandmother. The patient's aunt then accompanied the patient and the room throughout the rest of the appointment. We had a lengthy discussion regarding the deleterious effects of uncontrolled hyperglycemia and smoking as it relates to wound healing and overall health. She was strongly encouraged to decrease her smoking and get her diabetes under better control. She states she is currently on a diet and has cut down her [facility] consumption. The left toe is erythematous, macerated and slightly edematous with malodor present. The edema in her left foot is below her baseline, there is no erythema streaking. We will treat her with Santyl, doxycycline; we have ordered and xray, culture and provided a Peg assist surgical shoe and cultured the wound. 01/25/18-She is here in follow-up evaluation for a left great toe ulcer and presents with an abscess to her suprapubic area. She states her blood sugars remain elevated, feeling "sick" and if levels are below 250, but she is trying. She has made no attempt to decrease her smoking stating that we "can't take away her food in her cigarettes". She has been compliant with offloading using the PEG assist you. She is using Santyl daily. the culture obtained last week grew staph aureus and Enterococcus faecalis; continues on the doxycycline and Augmentin was added on Monday. The suprapubic area has erythema, no femoral variation, purple discoloration, minimal induration, was accessed with a cotton tip applicator with sanguinopurulent drainage, this was cultured, I suspect the MARCINE, GADWAY A ([id number]) [phone].pdf Page 2 of 20 current  antibiotic treatment will cover and we  here in the clinic. If anything worsens or changes patient will contact our office for additional recommendations. Electronic Signature(s) Signed: 07/24/2023 3:42:53 PM By: [name] PA-C Entered By: [name] on 07/24/2023 15:42:53 -------------------------------------------------------------------------------- Total Contact Cast Details Patient Name: Date of Service: ILDA, LASKIN 07/24/2023 2:45 PM Medical Record Number: [id number] Patient Account Number: [id number] Date of Birth/Sex: Treating RN: 06/22/1976 (47 y.o. [name] Primary Care Provider: [name] Other Clinician: [name] Referring Provider: Treating Provider/Extender: [name] Weeks in Treatment: 29 T Contact Cast Applied for Wound Assessment: otal Wound #12R Left,Medial,Plantar  Foot Performed By: Physician [name], PA-C Post Procedure Diagnosis Same as Pre-procedure Electronic Signature(s) Signed: 07/24/2023 4:50:01 PM By: [name] PA-C Entered By: [name] on 07/24/2023 15:24:38 -------------------------------------------------------------------------------- SuperBill Details Patient Name: Date of Service: [name] 07/24/2023 Medical Record Number: [id number] Patient Account Number: [id number] Date of Birth/Sex: Treating RN: [birth year] (47 y.o. 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 Z79.4 Long term (current) use of insulin N18.6 End stage renal disease Z99.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 Facility Procedures : CPT4 Code: 08657846 Description: 29445 - APPLY TOTAL CONTACT LEG CAST ICD-10 Diagnosis Description 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 Modifier: ity Quantity: 1 Physician Procedures : CPT4 Code Description Modifier [id number] 41324 - WC PHYS APPLY TOTAL CONTACT CAST ICD-10 Diagnosis Description 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ity Quantity: 1 Electronic Signature(s) Signed: 07/24/2023 3:43:06 PM By: [name] PA-C Entered By: [name] on 07/24/2023 15:43:06  02/22/2023 No Yes N18.6 End stage renal disease 07/14/2023 No Yes Z99.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 07/14/2023 No Yes Inactive Problems Resolved Problems ICD-10 Code Description Active Date Resolved Date L97.512 Non-pressure chronic ulcer of other part of right foot with fat layer exposed 03/16/2022 03/16/2022 L97.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 of skin 03/16/2022 03/16/2022 W54.0XXA Bitten by dog, initial encounter 05/17/2023 05/17/2023 S81.801A Unspecified open wound, right lower leg, initial encounter 05/17/2023 05/17/2023 X91.478 Type 2 diabetes mellitus with other skin ulcer 05/17/2023 05/17/2023 MARYRUTH, APPLE A ([id number]) [id number] Page 11 of 20 Electronic Signature(s) Signed: 07/24/2023 3:20:56 PM By: [name] PA-C Entered By: [name] on 07/24/2023 15:20:56 -------------------------------------------------------------------------------- Progress Note Details Patient Name: Date of Service: TERSA, FOTOPOULOS 07/24/2023 2:45 PM Medical Record Number:  [id number] Patient Account Number: [id number] Date of Birth/Sex: Treating RN: 23-Mar-1976 (47 y.o. [name] Primary Care Provider: [name] Other Clinician: [name] Referring Provider: Treating Provider/Extender: [name] in Treatment: 33 Subjective Chief Complaint Information obtained from Patient Left foot ulcer History of Present Illness (HPI) 01/18/18-She is here for initial evaluation of the left great toe ulcer. She is a poor historian in regards to timeframe in detail. She states approximately 4 weeks ago she lacerated her toe on something in the house. She followed up with her primary care who placed her on Bactrim and ultimately a second dose of Bactrim prior to coming to wound clinic. She states she has been treating the toe with peroxide, Betadine and a Band-Aid. She did not check her blood sugar this morning but checked it yesterday morning it was 327; she is unaware of a recent A1c and there are no current records. She saw Dr. she would've orthopedics last week for an old injury to the left ankle, she states he did not see her toe, nor did she bring it to his attention. She smokes approximately 1 pack cigarettes a day. Her social situation is concerning, she arrives this morning with her mother who appears extremely intoxicated/under the influence; her mother was asked to leave the room and be monitored by the patient's grandmother. The patient's aunt then accompanied the patient and the room throughout the rest of the appointment. We had a lengthy discussion regarding the deleterious effects of uncontrolled hyperglycemia and smoking as it relates to wound healing and overall health. She was strongly encouraged to decrease her smoking and get her diabetes under better control. She states she is currently on a diet and has cut down her [facility] consumption. The left toe is erythematous, macerated and slightly edematous with malodor  present. The edema in her left foot is below her baseline, there is no erythema streaking. We will treat her with Santyl, doxycycline; we have ordered and xray, culture and provided a Peg assist surgical shoe and cultured the wound. 01/25/18-She is here in follow-up evaluation for a left great toe ulcer and presents with an abscess to her suprapubic area. She states her blood sugars remain elevated, feeling "sick" and if levels are below 250, but she is trying. She has made no attempt to decrease her smoking stating that we "can't take away her food in her cigarettes". She has been compliant with offloading using the PEG assist you. She is using Santyl daily. the culture obtained last week grew staph aureus and Enterococcus faecalis; continues on the doxycycline and Augmentin was added on Monday. The suprapubic area has erythema, no femoral variation, purple  this is much better than last week no debridement even necessary today. We may have to perform some slight debridement come next week we will see how things go. Monitor for any signs of worsening overall and obviously if anything changes she should be letting us know. Electronic Signature(s) Signed: 07/24/2023 3:41:50 PM By: [name] PA-C Entered By: [name] on 07/24/2023 15:41:50 -------------------------------------------------------------------------------- Physical Exam Details Patient Name: Date of Service: ISHITA, MCNERNEY 07/24/2023 2:45 PM Medical Record Number: [id number] Patient Account Number: [id number] Date of Birth/Sex: Treating RN: 02-14-1976 (47 y.o. [name] Primary Care Provider: [name] Other Clinician: [name] Referring Provider: Treating Provider/Extender: [name] Weeks in Treatment: 87 Constitutional Thin and well-hydrated in no acute distress. Respiratory normal breathing without difficulty. Psychiatric this patient is able to make decisions and demonstrates good insight into disease process. Alert and Oriented x 3. pleasant and cooperative. Notes Upon inspection patient's wound bed actually showed signs of good granulation and epithelization at this point. Fortunately I do not see worsening I think that the total contact cast is doing quite well I would recommend that we continue with the plan currently. any signs of WAKEELAH, SOLAN A ([id number]) [id number] Page 9 of 20 Electronic Signature(s) Signed: 07/24/2023 3:42:06 PM By: [name] PA-C Entered By: [name] on 07/24/2023  15:42:06 -------------------------------------------------------------------------------- Physician Orders Details Patient Name: Date of Service: CHARLEA, NARDO 07/24/2023 2:45 PM Medical Record Number: [id number] Patient Account Number: [id number] Date of Birth/Sex: Treating RN: 1976-08-14 (47 y.o. 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 Bathing/ Shower/ Hygiene May shower; gently cleanse wound with antibacterial soap, rinse and pat dry prior to dressing wounds No tub bath. Off-Loading Total Contact Cast to Left Lower Extremity Open toe surgical shoe - right foot Wound Treatment Wound #12R - Foot Wound Laterality: Plantar, Left, Medial Cleanser: Vashe 5.8 (oz) Discharge Instructions: Use vashe 5.8 (oz) as directed Topical: Gentamicin Discharge Instructions: Apply as directed by provider. Prim Dressing: Hydrofera Blue Ready Transfer Foam, 2.5x2.5 (in/in) ary Discharge Instructions: Apply Hydrofera Blue Ready to wound bed as directed Secondary Dressing: ABD Pad 5x9 (in/in) Discharge Instructions: Cover with ABD pad Electronic Signature(s) Signed: 07/24/2023 4:50:01 PM By: [name] PA-C Entered By: [name] on 07/24/2023 15:25:15 Earmon Phoenix A ([id number]) [id number] Page 10 of  20 -------------------------------------------------------------------------------- Problem List Details Patient Name: Date of Service: LAVANA, HUCKEBA 07/24/2023 2:45 PM Medical Record Number: [id number] Patient Account Number: [id number] Date of Birth/Sex: Treating RN: 1976/07/02 (47 y.o. 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 03/16/2022 No Yes severity E11.42 Type 2 diabetes mellitus with diabetic polyneuropathy 03/16/2022 No Yes Z79.4 Long term (current) use of insulin  02/22/2023 No Yes N18.6 End stage renal disease 07/14/2023 No Yes Z99.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 07/14/2023 No Yes Inactive Problems Resolved Problems ICD-10 Code Description Active Date Resolved Date L97.512 Non-pressure chronic ulcer of other part of right foot with fat layer exposed 03/16/2022 03/16/2022 L97.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 of skin 03/16/2022 03/16/2022 W54.0XXA Bitten by dog, initial encounter 05/17/2023 05/17/2023 S81.801A Unspecified open wound, right lower leg, initial encounter 05/17/2023 05/17/2023 X91.478 Type 2 diabetes mellitus with other skin ulcer 05/17/2023 05/17/2023 MARYRUTH, APPLE A ([id number]) [id number] Page 11 of 20 Electronic Signature(s) Signed: 07/24/2023 3:20:56 PM By: [name] PA-C Entered By: [name] on 07/24/2023 15:20:56 -------------------------------------------------------------------------------- Progress Note Details Patient Name: Date of Service: TERSA, FOTOPOULOS 07/24/2023 2:45 PM Medical Record Number:  [id number] Patient Account Number: [id number] Date of Birth/Sex: Treating RN: 23-Mar-1976 (47 y.o. [name] Primary Care Provider: [name] Other Clinician: [name] Referring Provider: Treating Provider/Extender: [name] in Treatment: 33 Subjective Chief Complaint Information obtained from Patient Left foot ulcer History of Present Illness (HPI) 01/18/18-She is here for initial evaluation of the left great toe ulcer. She is a poor historian in regards to timeframe in detail. She states approximately 4 weeks ago she lacerated her toe on something in the house. She followed up with her primary care who placed her on Bactrim and ultimately a second dose of Bactrim prior to coming to wound clinic. She states she has been treating the toe with peroxide, Betadine and a Band-Aid. She did not check her blood sugar this morning but checked it yesterday morning it was 327; she is unaware of a recent A1c and there are no current records. She saw Dr. she would've orthopedics last week for an old injury to the left ankle, she states he did not see her toe, nor did she bring it to his attention. She smokes approximately 1 pack cigarettes a day. Her social situation is concerning, she arrives this morning with her mother who appears extremely intoxicated/under the influence; her mother was asked to leave the room and be monitored by the patient's grandmother. The patient's aunt then accompanied the patient and the room throughout the rest of the appointment. We had a lengthy discussion regarding the deleterious effects of uncontrolled hyperglycemia and smoking as it relates to wound healing and overall health. She was strongly encouraged to decrease her smoking and get her diabetes under better control. She states she is currently on a diet and has cut down her [facility] consumption. The left toe is erythematous, macerated and slightly edematous with malodor  present. The edema in her left foot is below her baseline, there is no erythema streaking. We will treat her with Santyl, doxycycline; we have ordered and xray, culture and provided a Peg assist surgical shoe and cultured the wound. 01/25/18-She is here in follow-up evaluation for a left great toe ulcer and presents with an abscess to her suprapubic area. She states her blood sugars remain elevated, feeling "sick" and if levels are below 250, but she is trying. She has made no attempt to decrease her smoking stating that we "can't take away her food in her cigarettes". She has been compliant with offloading using the PEG assist you. She is using Santyl daily. the culture obtained last week grew staph aureus and Enterococcus faecalis; continues on the doxycycline and Augmentin was added on Monday. The suprapubic area has erythema, no femoral variation, purple  here in the clinic. If anything worsens or changes patient will contact our office for additional recommendations. Electronic Signature(s) Signed: 07/24/2023 3:42:53 PM By: [name] PA-C Entered By: [name] on 07/24/2023 15:42:53 -------------------------------------------------------------------------------- Total Contact Cast Details Patient Name: Date of Service: ILDA, LASKIN 07/24/2023 2:45 PM Medical Record Number: [id number] Patient Account Number: [id number] Date of Birth/Sex: Treating RN: 06/22/1976 (47 y.o. [name] Primary Care Provider: [name] Other Clinician: [name] Referring Provider: Treating Provider/Extender: [name] Weeks in Treatment: 29 T Contact Cast Applied for Wound Assessment: otal Wound #12R Left,Medial,Plantar  Foot Performed By: Physician [name], PA-C Post Procedure Diagnosis Same as Pre-procedure Electronic Signature(s) Signed: 07/24/2023 4:50:01 PM By: [name] PA-C Entered By: [name] on 07/24/2023 15:24:38 -------------------------------------------------------------------------------- SuperBill Details Patient Name: Date of Service: [name] 07/24/2023 Medical Record Number: [id number] Patient Account Number: [id number] Date of Birth/Sex: Treating RN: [birth year] (47 y.o. 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 Z79.4 Long term (current) use of insulin N18.6 End stage renal disease Z99.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 Facility Procedures : CPT4 Code: 08657846 Description: 29445 - APPLY TOTAL CONTACT LEG CAST ICD-10 Diagnosis Description 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 Modifier: ity Quantity: 1 Physician Procedures : CPT4 Code Description Modifier [id number] 41324 - WC PHYS APPLY TOTAL CONTACT CAST ICD-10 Diagnosis Description 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ity Quantity: 1 Electronic Signature(s) Signed: 07/24/2023 3:43:06 PM By: [name] PA-C Entered By: [name] on 07/24/2023 15:43:06  here in the clinic. If anything worsens or changes patient will contact our office for additional recommendations. Electronic Signature(s) Signed: 07/24/2023 3:42:53 PM By: [name] PA-C Entered By: [name] on 07/24/2023 15:42:53 -------------------------------------------------------------------------------- Total Contact Cast Details Patient Name: Date of Service: ILDA, LASKIN 07/24/2023 2:45 PM Medical Record Number: [id number] Patient Account Number: [id number] Date of Birth/Sex: Treating RN: 06/22/1976 (47 y.o. [name] Primary Care Provider: [name] Other Clinician: [name] Referring Provider: Treating Provider/Extender: [name] Weeks in Treatment: 29 T Contact Cast Applied for Wound Assessment: otal Wound #12R Left,Medial,Plantar  Foot Performed By: Physician [name], PA-C Post Procedure Diagnosis Same as Pre-procedure Electronic Signature(s) Signed: 07/24/2023 4:50:01 PM By: [name] PA-C Entered By: [name] on 07/24/2023 15:24:38 -------------------------------------------------------------------------------- SuperBill Details Patient Name: Date of Service: [name] 07/24/2023 Medical Record Number: [id number] Patient Account Number: [id number] Date of Birth/Sex: Treating RN: [birth year] (47 y.o. 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 Z79.4 Long term (current) use of insulin N18.6 End stage renal disease Z99.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 Facility Procedures : CPT4 Code: 08657846 Description: 29445 - APPLY TOTAL CONTACT LEG CAST ICD-10 Diagnosis Description 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 Modifier: ity Quantity: 1 Physician Procedures : CPT4 Code Description Modifier [id number] 41324 - WC PHYS APPLY TOTAL CONTACT CAST ICD-10 Diagnosis Description 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ity Quantity: 1 Electronic Signature(s) Signed: 07/24/2023 3:43:06 PM By: [name] PA-C Entered By: [name] on 07/24/2023 15:43:06  here in the clinic. If anything worsens or changes patient will contact our office for additional recommendations. Electronic Signature(s) Signed: 07/24/2023 3:42:53 PM By: [name] PA-C Entered By: [name] on 07/24/2023 15:42:53 -------------------------------------------------------------------------------- Total Contact Cast Details Patient Name: Date of Service: ILDA, LASKIN 07/24/2023 2:45 PM Medical Record Number: [id number] Patient Account Number: [id number] Date of Birth/Sex: Treating RN: 06/22/1976 (47 y.o. [name] Primary Care Provider: [name] Other Clinician: [name] Referring Provider: Treating Provider/Extender: [name] Weeks in Treatment: 29 T Contact Cast Applied for Wound Assessment: otal Wound #12R Left,Medial,Plantar  Foot Performed By: Physician [name], PA-C Post Procedure Diagnosis Same as Pre-procedure Electronic Signature(s) Signed: 07/24/2023 4:50:01 PM By: [name] PA-C Entered By: [name] on 07/24/2023 15:24:38 -------------------------------------------------------------------------------- SuperBill Details Patient Name: Date of Service: [name] 07/24/2023 Medical Record Number: [id number] Patient Account Number: [id number] Date of Birth/Sex: Treating RN: [birth year] (47 y.o. 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 Z79.4 Long term (current) use of insulin N18.6 End stage renal disease Z99.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 Facility Procedures : CPT4 Code: 08657846 Description: 29445 - APPLY TOTAL CONTACT LEG CAST ICD-10 Diagnosis Description 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 Modifier: ity Quantity: 1 Physician Procedures : CPT4 Code Description Modifier [id number] 41324 - WC PHYS APPLY TOTAL CONTACT CAST ICD-10 Diagnosis Description 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ity Quantity: 1 Electronic Signature(s) Signed: 07/24/2023 3:43:06 PM By: [name] PA-C Entered By: [name] on 07/24/2023 15:43:06  NONNA, RENNINGER A ([id number]) [id number] Page 1 of 20 Visit Report for 07/24/2023 Chief Complaint Document Details Patient Name: Date of Service: RAHIMA, FLEISHMAN 07/24/2023 2:45 PM Medical Record Number: [id number] Patient Account Number: [id number] Date of Birth/Sex: Treating RN: [birth year] (47 y.o. [name] Primary Care Provider: [name] Other Clinician: [name] Referring Provider: Treating Provider/Extender: [name] Weeks in Treatment: 42 Information Obtained from: Patient Chief Complaint Left foot ulcer Electronic Signature(s) Signed: 07/24/2023 3:21:00 PM By: [name] PA-C Entered By: [name] on 07/24/2023 15:21:00 -------------------------------------------------------------------------------- HPI Details Patient Name: Date of Service: MERY, GUADALUPE 07/24/2023 2:45 PM Medical Record Number: [id number] Patient Account Number: [id number] Date of Birth/Sex: Treating RN: 1976-10-04 (47 y.o. [name] Primary Care Provider: [name] Other Clinician: [name] Referring Provider: Treating Provider/Extender: [name] in Treatment: 66 History of Present Illness HPI Description: 01/18/18-She is here for initial evaluation of the left great toe ulcer. She is a poor historian in regards to timeframe in detail. She states approximately 4 weeks ago she lacerated her toe on something in the house. She followed up with her primary care who placed her on Bactrim and ultimately a second dose of Bactrim prior to coming to wound clinic. She states she has been treating the toe with peroxide, Betadine and a Band-Aid. She did not check her blood sugar this morning but checked it yesterday morning it was 327; she is unaware of a recent A1c and there are no current records. She saw Dr. she would've orthopedics last week for an old injury to the left ankle, she states he did not see  her toe, nor did she bring it to his attention. She smokes approximately 1 pack cigarettes a day. Her social situation is concerning, she arrives this morning with her mother who appears extremely intoxicated/under the influence; her mother was asked to leave the room and be monitored by the patient's grandmother. The patient's aunt then accompanied the patient and the room throughout the rest of the appointment. We had a lengthy discussion regarding the deleterious effects of uncontrolled hyperglycemia and smoking as it relates to wound healing and overall health. She was strongly encouraged to decrease her smoking and get her diabetes under better control. She states she is currently on a diet and has cut down her [facility] consumption. The left toe is erythematous, macerated and slightly edematous with malodor present. The edema in her left foot is below her baseline, there is no erythema streaking. We will treat her with Santyl, doxycycline; we have ordered and xray, culture and provided a Peg assist surgical shoe and cultured the wound. 01/25/18-She is here in follow-up evaluation for a left great toe ulcer and presents with an abscess to her suprapubic area. She states her blood sugars remain elevated, feeling "sick" and if levels are below 250, but she is trying. She has made no attempt to decrease her smoking stating that we "can't take away her food in her cigarettes". She has been compliant with offloading using the PEG assist you. She is using Santyl daily. the culture obtained last week grew staph aureus and Enterococcus faecalis; continues on the doxycycline and Augmentin was added on Monday. The suprapubic area has erythema, no femoral variation, purple discoloration, minimal induration, was accessed with a cotton tip applicator with sanguinopurulent drainage, this was cultured, I suspect the MARCINE, GADWAY A ([id number]) [phone].pdf Page 2 of 20 current  antibiotic treatment will cover and we  02/22/2023 No Yes N18.6 End stage renal disease 07/14/2023 No Yes Z99.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 07/14/2023 No Yes Inactive Problems Resolved Problems ICD-10 Code Description Active Date Resolved Date L97.512 Non-pressure chronic ulcer of other part of right foot with fat layer exposed 03/16/2022 03/16/2022 L97.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 of skin 03/16/2022 03/16/2022 W54.0XXA Bitten by dog, initial encounter 05/17/2023 05/17/2023 S81.801A Unspecified open wound, right lower leg, initial encounter 05/17/2023 05/17/2023 X91.478 Type 2 diabetes mellitus with other skin ulcer 05/17/2023 05/17/2023 MARYRUTH, APPLE A ([id number]) [id number] Page 11 of 20 Electronic Signature(s) Signed: 07/24/2023 3:20:56 PM By: [name] PA-C Entered By: [name] on 07/24/2023 15:20:56 -------------------------------------------------------------------------------- Progress Note Details Patient Name: Date of Service: TERSA, FOTOPOULOS 07/24/2023 2:45 PM Medical Record Number:  [id number] Patient Account Number: [id number] Date of Birth/Sex: Treating RN: 23-Mar-1976 (47 y.o. [name] Primary Care Provider: [name] Other Clinician: [name] Referring Provider: Treating Provider/Extender: [name] in Treatment: 33 Subjective Chief Complaint Information obtained from Patient Left foot ulcer History of Present Illness (HPI) 01/18/18-She is here for initial evaluation of the left great toe ulcer. She is a poor historian in regards to timeframe in detail. She states approximately 4 weeks ago she lacerated her toe on something in the house. She followed up with her primary care who placed her on Bactrim and ultimately a second dose of Bactrim prior to coming to wound clinic. She states she has been treating the toe with peroxide, Betadine and a Band-Aid. She did not check her blood sugar this morning but checked it yesterday morning it was 327; she is unaware of a recent A1c and there are no current records. She saw Dr. she would've orthopedics last week for an old injury to the left ankle, she states he did not see her toe, nor did she bring it to his attention. She smokes approximately 1 pack cigarettes a day. Her social situation is concerning, she arrives this morning with her mother who appears extremely intoxicated/under the influence; her mother was asked to leave the room and be monitored by the patient's grandmother. The patient's aunt then accompanied the patient and the room throughout the rest of the appointment. We had a lengthy discussion regarding the deleterious effects of uncontrolled hyperglycemia and smoking as it relates to wound healing and overall health. She was strongly encouraged to decrease her smoking and get her diabetes under better control. She states she is currently on a diet and has cut down her [facility] consumption. The left toe is erythematous, macerated and slightly edematous with malodor  present. The edema in her left foot is below her baseline, there is no erythema streaking. We will treat her with Santyl, doxycycline; we have ordered and xray, culture and provided a Peg assist surgical shoe and cultured the wound. 01/25/18-She is here in follow-up evaluation for a left great toe ulcer and presents with an abscess to her suprapubic area. She states her blood sugars remain elevated, feeling "sick" and if levels are below 250, but she is trying. She has made no attempt to decrease her smoking stating that we "can't take away her food in her cigarettes". She has been compliant with offloading using the PEG assist you. She is using Santyl daily. the culture obtained last week grew staph aureus and Enterococcus faecalis; continues on the doxycycline and Augmentin was added on Monday. The suprapubic area has erythema, no femoral variation, purple  this is much better than last week no debridement even necessary today. We may have to perform some slight debridement come next week we will see how things go. Monitor for any signs of worsening overall and obviously if anything changes she should be letting us know. Electronic Signature(s) Signed: 07/24/2023 3:41:50 PM By: [name] PA-C Entered By: [name] on 07/24/2023 15:41:50 -------------------------------------------------------------------------------- Physical Exam Details Patient Name: Date of Service: ISHITA, MCNERNEY 07/24/2023 2:45 PM Medical Record Number: [id number] Patient Account Number: [id number] Date of Birth/Sex: Treating RN: 02-14-1976 (47 y.o. [name] Primary Care Provider: [name] Other Clinician: [name] Referring Provider: Treating Provider/Extender: [name] Weeks in Treatment: 87 Constitutional Thin and well-hydrated in no acute distress. Respiratory normal breathing without difficulty. Psychiatric this patient is able to make decisions and demonstrates good insight into disease process. Alert and Oriented x 3. pleasant and cooperative. Notes Upon inspection patient's wound bed actually showed signs of good granulation and epithelization at this point. Fortunately I do not see worsening I think that the total contact cast is doing quite well I would recommend that we continue with the plan currently. any signs of WAKEELAH, SOLAN A ([id number]) [id number] Page 9 of 20 Electronic Signature(s) Signed: 07/24/2023 3:42:06 PM By: [name] PA-C Entered By: [name] on 07/24/2023  15:42:06 -------------------------------------------------------------------------------- Physician Orders Details Patient Name: Date of Service: CHARLEA, NARDO 07/24/2023 2:45 PM Medical Record Number: [id number] Patient Account Number: [id number] Date of Birth/Sex: Treating RN: 1976-08-14 (47 y.o. 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 Bathing/ Shower/ Hygiene May shower; gently cleanse wound with antibacterial soap, rinse and pat dry prior to dressing wounds No tub bath. Off-Loading Total Contact Cast to Left Lower Extremity Open toe surgical shoe - right foot Wound Treatment Wound #12R - Foot Wound Laterality: Plantar, Left, Medial Cleanser: Vashe 5.8 (oz) Discharge Instructions: Use vashe 5.8 (oz) as directed Topical: Gentamicin Discharge Instructions: Apply as directed by provider. Prim Dressing: Hydrofera Blue Ready Transfer Foam, 2.5x2.5 (in/in) ary Discharge Instructions: Apply Hydrofera Blue Ready to wound bed as directed Secondary Dressing: ABD Pad 5x9 (in/in) Discharge Instructions: Cover with ABD pad Electronic Signature(s) Signed: 07/24/2023 4:50:01 PM By: [name] PA-C Entered By: [name] on 07/24/2023 15:25:15 Earmon Phoenix A ([id number]) [id number] Page 10 of  20 -------------------------------------------------------------------------------- Problem List Details Patient Name: Date of Service: LAVANA, HUCKEBA 07/24/2023 2:45 PM Medical Record Number: [id number] Patient Account Number: [id number] Date of Birth/Sex: Treating RN: 1976/07/02 (47 y.o. 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 [name] Primary Care Provider: [name] Other Clinician: [name] Referring Provider: Treating Provider/Extender: [name] Weeks in Treatment: 42 Information Obtained from: Patient Chief Complaint Left foot ulcer Electronic Signature(s) Signed: 07/24/2023 3:21:00 PM By: [name] PA-C Entered By: [name] on 07/24/2023 15:21:00 -------------------------------------------------------------------------------- HPI Details Patient Name: Date of Service: MERY, GUADALUPE 07/24/2023 2:45 PM Medical Record Number: [id number] Patient Account Number: [id number] Date of Birth/Sex: Treating RN: 1976-10-04 (47 y.o. [name] Primary Care Provider: [name] Other Clinician: [name] Referring Provider: Treating Provider/Extender: [name] in Treatment: 66 History of Present Illness HPI Description: 01/18/18-She is here for initial evaluation of the left great toe ulcer. She is a poor historian in regards to timeframe in detail. She states approximately 4 weeks ago she lacerated her toe on something in the house. She followed up with her primary care who placed her on Bactrim and ultimately a second dose of Bactrim prior to coming to wound clinic. She states she has been treating the toe with peroxide, Betadine and a Band-Aid. She did not check her blood sugar this morning but checked it yesterday morning it was 327; she is unaware of a recent A1c and there are no current records. She saw Dr. she would've orthopedics last week for an old injury to the left ankle, she states he did not see  her toe, nor did she bring it to his attention. She smokes approximately 1 pack cigarettes a day. Her social situation is concerning, she arrives this morning with her mother who appears extremely intoxicated/under the influence; her mother was asked to leave the room and be monitored by the patient's grandmother. The patient's aunt then accompanied the patient and the room throughout the rest of the appointment. We had a lengthy discussion regarding the deleterious effects of uncontrolled hyperglycemia and smoking as it relates to wound healing and overall health. She was strongly encouraged to decrease her smoking and get her diabetes under better control. She states she is currently on a diet and has cut down her [facility] consumption. The left toe is erythematous, macerated and slightly edematous with malodor present. The edema in her left foot is below her baseline, there is no erythema streaking. We will treat her with Santyl, doxycycline; we have ordered and xray, culture and provided a Peg assist surgical shoe and cultured the wound. 01/25/18-She is here in follow-up evaluation for a left great toe ulcer and presents with an abscess to her suprapubic area. She states her blood sugars remain elevated, feeling "sick" and if levels are below 250, but she is trying. She has made no attempt to decrease her smoking stating that we "can't take away her food in her cigarettes". She has been compliant with offloading using the PEG assist you. She is using Santyl daily. the culture obtained last week grew staph aureus and Enterococcus faecalis; continues on the doxycycline and Augmentin was added on Monday. The suprapubic area has erythema, no femoral variation, purple discoloration, minimal induration, was accessed with a cotton tip applicator with sanguinopurulent drainage, this was cultured, I suspect the MARCINE, GADWAY A ([id number]) [phone].pdf Page 2 of 20 current  antibiotic treatment will cover and we  this is much better than last week no debridement even necessary today. We may have to perform some slight debridement come next week we will see how things go. Monitor for any signs of worsening overall and obviously if anything changes she should be letting us know. Electronic Signature(s) Signed: 07/24/2023 3:41:50 PM By: [name] PA-C Entered By: [name] on 07/24/2023 15:41:50 -------------------------------------------------------------------------------- Physical Exam Details Patient Name: Date of Service: ISHITA, MCNERNEY 07/24/2023 2:45 PM Medical Record Number: [id number] Patient Account Number: [id number] Date of Birth/Sex: Treating RN: 02-14-1976 (47 y.o. [name] Primary Care Provider: [name] Other Clinician: [name] Referring Provider: Treating Provider/Extender: [name] Weeks in Treatment: 87 Constitutional Thin and well-hydrated in no acute distress. Respiratory normal breathing without difficulty. Psychiatric this patient is able to make decisions and demonstrates good insight into disease process. Alert and Oriented x 3. pleasant and cooperative. Notes Upon inspection patient's wound bed actually showed signs of good granulation and epithelization at this point. Fortunately I do not see worsening I think that the total contact cast is doing quite well I would recommend that we continue with the plan currently. any signs of WAKEELAH, SOLAN A ([id number]) [id number] Page 9 of 20 Electronic Signature(s) Signed: 07/24/2023 3:42:06 PM By: [name] PA-C Entered By: [name] on 07/24/2023  15:42:06 -------------------------------------------------------------------------------- Physician Orders Details Patient Name: Date of Service: CHARLEA, NARDO 07/24/2023 2:45 PM Medical Record Number: [id number] Patient Account Number: [id number] Date of Birth/Sex: Treating RN: 1976-08-14 (47 y.o. 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 Bathing/ Shower/ Hygiene May shower; gently cleanse wound with antibacterial soap, rinse and pat dry prior to dressing wounds No tub bath. Off-Loading Total Contact Cast to Left Lower Extremity Open toe surgical shoe - right foot Wound Treatment Wound #12R - Foot Wound Laterality: Plantar, Left, Medial Cleanser: Vashe 5.8 (oz) Discharge Instructions: Use vashe 5.8 (oz) as directed Topical: Gentamicin Discharge Instructions: Apply as directed by provider. Prim Dressing: Hydrofera Blue Ready Transfer Foam, 2.5x2.5 (in/in) ary Discharge Instructions: Apply Hydrofera Blue Ready to wound bed as directed Secondary Dressing: ABD Pad 5x9 (in/in) Discharge Instructions: Cover with ABD pad Electronic Signature(s) Signed: 07/24/2023 4:50:01 PM By: [name] PA-C Entered By: [name] on 07/24/2023 15:25:15 Earmon Phoenix A ([id number]) [id number] Page 10 of  20 -------------------------------------------------------------------------------- Problem List Details Patient Name: Date of Service: LAVANA, HUCKEBA 07/24/2023 2:45 PM Medical Record Number: [id number] Patient Account Number: [id number] Date of Birth/Sex: Treating RN: 1976/07/02 (47 y.o. 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 03/16/2022 No Yes severity E11.42 Type 2 diabetes mellitus with diabetic polyneuropathy 03/16/2022 No Yes Z79.4 Long term (current) use of insulin  02/22/2023 No Yes N18.6 End stage renal disease 07/14/2023 No Yes Z99.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 07/14/2023 No Yes Inactive Problems Resolved Problems ICD-10 Code Description Active Date Resolved Date L97.512 Non-pressure chronic ulcer of other part of right foot with fat layer exposed 03/16/2022 03/16/2022 L97.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 of skin 03/16/2022 03/16/2022 W54.0XXA Bitten by dog, initial encounter 05/17/2023 05/17/2023 S81.801A Unspecified open wound, right lower leg, initial encounter 05/17/2023 05/17/2023 X91.478 Type 2 diabetes mellitus with other skin ulcer 05/17/2023 05/17/2023 MARYRUTH, APPLE A ([id number]) [id number] Page 11 of 20 Electronic Signature(s) Signed: 07/24/2023 3:20:56 PM By: [name] PA-C Entered By: [name] on 07/24/2023 15:20:56 -------------------------------------------------------------------------------- Progress Note Details Patient Name: Date of Service: TERSA, FOTOPOULOS 07/24/2023 2:45 PM Medical Record Number:  [id number] Patient Account Number: [id number] Date of Birth/Sex: Treating RN: 23-Mar-1976 (47 y.o. [name] Primary Care Provider: [name] Other Clinician: [name] Referring Provider: Treating Provider/Extender: [name] in Treatment: 33 Subjective Chief Complaint Information obtained from Patient Left foot ulcer History of Present Illness (HPI) 01/18/18-She is here for initial evaluation of the left great toe ulcer. She is a poor historian in regards to timeframe in detail. She states approximately 4 weeks ago she lacerated her toe on something in the house. She followed up with her primary care who placed her on Bactrim and ultimately a second dose of Bactrim prior to coming to wound clinic. She states she has been treating the toe with peroxide, Betadine and a Band-Aid. She did not check her blood sugar this morning but checked it yesterday morning it was 327; she is unaware of a recent A1c and there are no current records. She saw Dr. she would've orthopedics last week for an old injury to the left ankle, she states he did not see her toe, nor did she bring it to his attention. She smokes approximately 1 pack cigarettes a day. Her social situation is concerning, she arrives this morning with her mother who appears extremely intoxicated/under the influence; her mother was asked to leave the room and be monitored by the patient's grandmother. The patient's aunt then accompanied the patient and the room throughout the rest of the appointment. We had a lengthy discussion regarding the deleterious effects of uncontrolled hyperglycemia and smoking as it relates to wound healing and overall health. She was strongly encouraged to decrease her smoking and get her diabetes under better control. She states she is currently on a diet and has cut down her [facility] consumption. The left toe is erythematous, macerated and slightly edematous with malodor  present. The edema in her left foot is below her baseline, there is no erythema streaking. We will treat her with Santyl, doxycycline; we have ordered and xray, culture and provided a Peg assist surgical shoe and cultured the wound. 01/25/18-She is here in follow-up evaluation for a left great toe ulcer and presents with an abscess to her suprapubic area. She states her blood sugars remain elevated, feeling "sick" and if levels are below 250, but she is trying. She has made no attempt to decrease her smoking stating that we "can't take away her food in her cigarettes". She has been compliant with offloading using the PEG assist you. She is using Santyl daily. the culture obtained last week grew staph aureus and Enterococcus faecalis; continues on the doxycycline and Augmentin was added on Monday. The suprapubic area has erythema, no femoral variation, purple  here in the clinic. If anything worsens or changes patient will contact our office for additional recommendations. Electronic Signature(s) Signed: 07/24/2023 3:42:53 PM By: [name] PA-C Entered By: [name] on 07/24/2023 15:42:53 -------------------------------------------------------------------------------- Total Contact Cast Details Patient Name: Date of Service: ILDA, LASKIN 07/24/2023 2:45 PM Medical Record Number: [id number] Patient Account Number: [id number] Date of Birth/Sex: Treating RN: 06/22/1976 (47 y.o. [name] Primary Care Provider: [name] Other Clinician: [name] Referring Provider: Treating Provider/Extender: [name] Weeks in Treatment: 29 T Contact Cast Applied for Wound Assessment: otal Wound #12R Left,Medial,Plantar  Foot Performed By: Physician [name], PA-C Post Procedure Diagnosis Same as Pre-procedure Electronic Signature(s) Signed: 07/24/2023 4:50:01 PM By: [name] PA-C Entered By: [name] on 07/24/2023 15:24:38 -------------------------------------------------------------------------------- SuperBill Details Patient Name: Date of Service: [name] 07/24/2023 Medical Record Number: [id number] Patient Account Number: [id number] Date of Birth/Sex: Treating RN: [birth year] (47 y.o. 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 Z79.4 Long term (current) use of insulin N18.6 End stage renal disease Z99.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 Facility Procedures : CPT4 Code: [id number] Description: 29445 - APPLY TOTAL CONTACT LEG CAST ICD-10 Diagnosis Description 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 Modifier: ity Quantity: 1 Physician Procedures : CPT4 Code Description Modifier [id number] - WC PHYS APPLY TOTAL CONTACT CAST ICD-10 Diagnosis Description 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ity Quantity: 1 Electronic Signature(s) Signed: 07/24/2023 3:43:06 PM By: [name] PA-C Entered By: [name] on 07/24/2023 15:43:06  NONNA, RENNINGER A ([id number]) [id number] Page 1 of 20 Visit Report for 07/24/2023 Chief Complaint Document Details Patient Name: Date of Service: RAHIMA, FLEISHMAN 07/24/2023 2:45 PM Medical Record Number: [id number] Patient Account Number: [id number] Date of Birth/Sex: Treating RN: [birth year] (47 y.o. [name] Primary Care Provider: [name] Other Clinician: [name] Referring Provider: Treating Provider/Extender: [name] Weeks in Treatment: 42 Information Obtained from: Patient Chief Complaint Left foot ulcer Electronic Signature(s) Signed: 07/24/2023 3:21:00 PM By: [name] PA-C Entered By: [name] on 07/24/2023 15:21:00 -------------------------------------------------------------------------------- HPI Details Patient Name: Date of Service: MERY, GUADALUPE 07/24/2023 2:45 PM Medical Record Number: [id number] Patient Account Number: [id number] Date of Birth/Sex: Treating RN: 1976-10-04 (47 y.o. [name] Primary Care Provider: [name] Other Clinician: [name] Referring Provider: Treating Provider/Extender: [name] in Treatment: 66 History of Present Illness HPI Description: 01/18/18-She is here for initial evaluation of the left great toe ulcer. She is a poor historian in regards to timeframe in detail. She states approximately 4 weeks ago she lacerated her toe on something in the house. She followed up with her primary care who placed her on Bactrim and ultimately a second dose of Bactrim prior to coming to wound clinic. She states she has been treating the toe with peroxide, Betadine and a Band-Aid. She did not check her blood sugar this morning but checked it yesterday morning it was 327; she is unaware of a recent A1c and there are no current records. She saw Dr. she would've orthopedics last week for an old injury to the left ankle, she states he did not see  her toe, nor did she bring it to his attention. She smokes approximately 1 pack cigarettes a day. Her social situation is concerning, she arrives this morning with her mother who appears extremely intoxicated/under the influence; her mother was asked to leave the room and be monitored by the patient's grandmother. The patient's aunt then accompanied the patient and the room throughout the rest of the appointment. We had a lengthy discussion regarding the deleterious effects of uncontrolled hyperglycemia and smoking as it relates to wound healing and overall health. She was strongly encouraged to decrease her smoking and get her diabetes under better control. She states she is currently on a diet and has cut down her [facility] consumption. The left toe is erythematous, macerated and slightly edematous with malodor present. The edema in her left foot is below her baseline, there is no erythema streaking. We will treat her with Santyl, doxycycline; we have ordered and xray, culture and provided a Peg assist surgical shoe and cultured the wound. 01/25/18-She is here in follow-up evaluation for a left great toe ulcer and presents with an abscess to her suprapubic area. She states her blood sugars remain elevated, feeling "sick" and if levels are below 250, but she is trying. She has made no attempt to decrease her smoking stating that we "can't take away her food in her cigarettes". She has been compliant with offloading using the PEG assist you. She is using Santyl daily. the culture obtained last week grew staph aureus and Enterococcus faecalis; continues on the doxycycline and Augmentin was added on Monday. The suprapubic area has erythema, no femoral variation, purple discoloration, minimal induration, was accessed with a cotton tip applicator with sanguinopurulent drainage, this was cultured, I suspect the MARCINE, GADWAY A ([id number]) [phone].pdf Page 2 of 20 current  antibiotic treatment will cover and we  this is much better than last week no debridement even necessary today. We may have to perform some slight debridement come next week we will see how things go. Monitor for any signs of worsening overall and obviously if anything changes she should be letting us know. Electronic Signature(s) Signed: 07/24/2023 3:41:50 PM By: [name] PA-C Entered By: [name] on 07/24/2023 15:41:50 -------------------------------------------------------------------------------- Physical Exam Details Patient Name: Date of Service: ISHITA, MCNERNEY 07/24/2023 2:45 PM Medical Record Number: [id number] Patient Account Number: [id number] Date of Birth/Sex: Treating RN: 02-14-1976 (47 y.o. [name] Primary Care Provider: [name] Other Clinician: [name] Referring Provider: Treating Provider/Extender: [name] Weeks in Treatment: 87 Constitutional Thin and well-hydrated in no acute distress. Respiratory normal breathing without difficulty. Psychiatric this patient is able to make decisions and demonstrates good insight into disease process. Alert and Oriented x 3. pleasant and cooperative. Notes Upon inspection patient's wound bed actually showed signs of good granulation and epithelization at this point. Fortunately I do not see worsening I think that the total contact cast is doing quite well I would recommend that we continue with the plan currently. any signs of WAKEELAH, SOLAN A ([id number]) [id number] Page 9 of 20 Electronic Signature(s) Signed: 07/24/2023 3:42:06 PM By: [name] PA-C Entered By: [name] on 07/24/2023  15:42:06 -------------------------------------------------------------------------------- Physician Orders Details Patient Name: Date of Service: CHARLEA, NARDO 07/24/2023 2:45 PM Medical Record Number: [id number] Patient Account Number: [id number] Date of Birth/Sex: Treating RN: 1976-08-14 (47 y.o. 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 Bathing/ Shower/ Hygiene May shower; gently cleanse wound with antibacterial soap, rinse and pat dry prior to dressing wounds No tub bath. Off-Loading Total Contact Cast to Left Lower Extremity Open toe surgical shoe - right foot Wound Treatment Wound #12R - Foot Wound Laterality: Plantar, Left, Medial Cleanser: Vashe 5.8 (oz) Discharge Instructions: Use vashe 5.8 (oz) as directed Topical: Gentamicin Discharge Instructions: Apply as directed by provider. Prim Dressing: Hydrofera Blue Ready Transfer Foam, 2.5x2.5 (in/in) ary Discharge Instructions: Apply Hydrofera Blue Ready to wound bed as directed Secondary Dressing: ABD Pad 5x9 (in/in) Discharge Instructions: Cover with ABD pad Electronic Signature(s) Signed: 07/24/2023 4:50:01 PM By: [name] PA-C Entered By: [name] on 07/24/2023 15:25:15 Earmon Phoenix A ([id number]) [id number] Page 10 of  20 -------------------------------------------------------------------------------- Problem List Details Patient Name: Date of Service: LAVANA, HUCKEBA 07/24/2023 2:45 PM Medical Record Number: [id number] Patient Account Number: [id number] Date of Birth/Sex: Treating RN: 1976/07/02 (47 y.o. 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 [name] Primary Care Provider: [name] Other Clinician: [name] Referring Provider: Treating Provider/Extender: [name] Weeks in Treatment: 42 Information Obtained from: Patient Chief Complaint Left foot ulcer Electronic Signature(s) Signed: 07/24/2023 3:21:00 PM By: [name] PA-C Entered By: [name] on 07/24/2023 15:21:00 -------------------------------------------------------------------------------- HPI Details Patient Name: Date of Service: MERY, GUADALUPE 07/24/2023 2:45 PM Medical Record Number: [id number] Patient Account Number: [id number] Date of Birth/Sex: Treating RN: 1976-10-04 (47 y.o. [name] Primary Care Provider: [name] Other Clinician: [name] Referring Provider: Treating Provider/Extender: [name] in Treatment: 66 History of Present Illness HPI Description: 01/18/18-She is here for initial evaluation of the left great toe ulcer. She is a poor historian in regards to timeframe in detail. She states approximately 4 weeks ago she lacerated her toe on something in the house. She followed up with her primary care who placed her on Bactrim and ultimately a second dose of Bactrim prior to coming to wound clinic. She states she has been treating the toe with peroxide, Betadine and a Band-Aid. She did not check her blood sugar this morning but checked it yesterday morning it was 327; she is unaware of a recent A1c and there are no current records. She saw Dr. she would've orthopedics last week for an old injury to the left ankle, she states he did not see  her toe, nor did she bring it to his attention. She smokes approximately 1 pack cigarettes a day. Her social situation is concerning, she arrives this morning with her mother who appears extremely intoxicated/under the influence; her mother was asked to leave the room and be monitored by the patient's grandmother. The patient's aunt then accompanied the patient and the room throughout the rest of the appointment. We had a lengthy discussion regarding the deleterious effects of uncontrolled hyperglycemia and smoking as it relates to wound healing and overall health. She was strongly encouraged to decrease her smoking and get her diabetes under better control. She states she is currently on a diet and has cut down her [facility] consumption. The left toe is erythematous, macerated and slightly edematous with malodor present. The edema in her left foot is below her baseline, there is no erythema streaking. We will treat her with Santyl, doxycycline; we have ordered and xray, culture and provided a Peg assist surgical shoe and cultured the wound. 01/25/18-She is here in follow-up evaluation for a left great toe ulcer and presents with an abscess to her suprapubic area. She states her blood sugars remain elevated, feeling "sick" and if levels are below 250, but she is trying. She has made no attempt to decrease her smoking stating that we "can't take away her food in her cigarettes". She has been compliant with offloading using the PEG assist you. She is using Santyl daily. the culture obtained last week grew staph aureus and Enterococcus faecalis; continues on the doxycycline and Augmentin was added on Monday. The suprapubic area has erythema, no femoral variation, purple discoloration, minimal induration, was accessed with a cotton tip applicator with sanguinopurulent drainage, this was cultured, I suspect the MARCINE, GADWAY A ([id number]) [phone].pdf Page 2 of 20 current  antibiotic treatment will cover and we

## 2023-07-25 NOTE — Progress Notes (Signed)
RUIE, WOLFROM A ([id number]) [id number] Page 1 of 8 Visit Report for 07/24/2023 Arrival Information Details Patient Name: Date of Service: [name], [name] 07/24/2023 2:45 PM Medical Record Number: [id number] Patient Account Number: [id number] Date of Birth/Sex: Treating RN: 1976-09-01 (47 y.o. [name] Primary Care Tyrion Glaude: [name] Other Clinician: [name] Referring Luqman Perrelli: Treating Jandy Brackens/Extender: [name] in Treatment: 40 Visit Information History Since Last Visit All ordered tests and consults were completed: No Patient Arrived: Wheel Chair Added or deleted any medications: No Arrival Time: 14:51 Any new allergies or adverse reactions: No Transfer Assistance: None Had a fall or experienced change in No Patient Identification Verified: Yes activities of daily living that may affect Secondary Verification Process Completed: Yes risk of falls: Patient Requires Transmission-Based Precautions: No Signs or symptoms of abuse/neglect since No Patient Has Alerts: No last visito Hospitalized since last visit: No Implantable device outside of the clinic No excluding cellular tissue based products placed in the center since last visit: Has Dressing in Place as Prescribed: Yes Has Footwear/Offloading in Place as Yes Prescribed: Left: T Contact Cast otal Right: Surgical Shoe with Pressure Relief Insole Pain Present Now: No Electronic Signature(s) Signed: 07/25/2023 8:28:10 AM By: [name] Entered By: [name] on 07/24/2023 11:52:18 -------------------------------------------------------------------------------- Clinic Level of Care Assessment Details Patient Name: Date of Service: KAMY, ANTRIM 07/24/2023 2:45 PM Medical Record Number: [id number] Patient Account Number: [id number] Date of Birth/Sex: Treating RN: 14-Nov-1975 (47 y.o. [name] Primary Care Hawley Michel: [name] Other Clinician: [name] Referring Annelies Coyt: Treating Tynslee Bowlds/Extender: [name] Weeks in Treatment: 59 Clinic Level of Care Assessment Items CHALICE, LEYDIG A ([id number]) [id number] Page 2 of 8 TOOL 1 Quantity Score []  - 0 Use when EandM and Procedure is performed on INITIAL visit ASSESSMENTS - Nursing Assessment / Reassessment []  - 0 General Physical Exam (combine w/ comprehensive assessment (listed just below) when performed on new pt. evals) []  - 0 Comprehensive Assessment (HX, ROS, Risk Assessments, Wounds Hx, etc.) ASSESSMENTS - Wound and Skin Assessment / Reassessment []  - 0 Dermatologic / Skin Assessment (not related to wound area) ASSESSMENTS - Ostomy and/or Continence Assessment and Care []  - 0 Incontinence Assessment and Management []  - 0 Ostomy Care Assessment and Management (repouching, etc.) PROCESS - Coordination of Care []  - 0 Simple Patient / Family Education for ongoing care []  - 0 Complex (extensive) Patient / Family Education for ongoing care []  - 0 Staff obtains Chiropractor, Records, T Results / Process Orders est []  - 0 Staff telephones HHA, Nursing Homes / Clarify orders / etc []  - 0 Routine Transfer to another Facility (non-emergent condition) []  - 0 Routine Hospital Admission (non-emergent condition) []  - 0 New Admissions / Manufacturing engineer / Ordering NPWT Apligraf, etc. , []  - 0 Emergency Hospital Admission (emergent condition) PROCESS - Special Needs []  - 0 Pediatric / Minor Patient Management []  - 0 Isolation Patient Management []  - 0 Hearing / Language / Visual special needs []  - 0 Assessment of Community assistance (transportation, D/C planning, etc.) []  - 0 Additional assistance / Altered mentation []  - 0 Support Surface(s) Assessment (bed, cushion, seat, etc.) INTERVENTIONS - Miscellaneous []  - 0 External ear exam []  - 0 Patient Transfer (multiple staff / Water quality scientist / Similar devices) []  - 0 Simple Staple / Suture removal (25 or less) []  - 0 Complex Staple / Suture removal (26 or more) []  - 0 Hypo/Hyperglycemic Management (do not check if billed  separately) []  - 0 Ankle / Brachial Index (ABI) - do not check if billed separately Has the patient been seen at the hospital within the last three years: Yes Total Score: 0 Level Of Care: ____ Electronic Signature(s) Signed: 07/25/2023 8:28:10 AM By: [name] Entered By: [name] on 07/24/2023 12:25:26 Earmon Phoenix A ([id number]) [id number] Page 3 of 8 -------------------------------------------------------------------------------- Encounter Discharge Information Details Patient Name: Date of Service: [name], [name] 07/24/2023 2:45 PM Medical Record Number: [id number] Patient Account Number: [id number] Date of Birth/Sex: Treating RN: 10-15-1976 (47 y.o. [name] Primary Care Jartavious Mckimmy: [name] Other Clinician: [name] Referring Jenifer Struve: Treating Talmage Teaster/Extender: [name] in Treatment: 76 Encounter Discharge Information Items Discharge Condition: Stable Ambulatory Status: Wheelchair Discharge Destination: Home Transportation: Private Auto Accompanied By: mother Schedule Follow-up Appointment: Yes Clinical Summary of Care: Electronic Signature(s) Signed: 07/25/2023 8:28:10 AM By: [name] Entered By: [name] on 07/24/2023 13:04:00 -------------------------------------------------------------------------------- Lower Extremity Assessment Details Patient Name: Date of Service: [name], [name] 07/24/2023 2:45 PM Medical Record Number: [id number] Patient Account Number: [id number] Date of Birth/Sex: Treating RN: 1976/04/03 (47 y.o. [name] Primary Care Delbra Zellars: [name] Other Clinician: [name] Referring Jermika Olden: Treating Pola Furno/Extender: [name] Weeks in Treatment: 1 Electronic Signature(s) Signed: 07/24/2023 3:42:39 PM By: Yevonne Pax RN Signed: 07/25/2023 8:28:10 AM By: [name] Entered By: [name] on 07/24/2023 12:10:51 -------------------------------------------------------------------------------- Multi Wound Chart Details Patient Name: Date of Service: [name] 07/24/2023 2:45 PM Medical Record Number: [id number] Patient Account Number: [id number] Date of Birth/Sex: Treating RN: 01-Aug-1976 (47 y.o. [name] Primary Care Roxana Lai: [name] Other Clinician: [name] Referring Narciso Stoutenburg: Treating Wilbon Obenchain/Extender: [name] Weeks in Treatment: [address] Vital Signs JENSY, MCKOY A ([id number]) [id number] Page 4 of 8 Height(in): 69 Pulse(bpm): 90 Weight(lbs): 178 Blood Pressure(mmHg): 117/70 Body Mass Index(BMI): 26.3 Temperature(F): 98.5 Respiratory Rate(breaths/min): 16 [12R:Photos:] [N/A:N/A] Left, Medial, Plantar Foot N/A N/A Wound Location: Pressure Injury N/A N/A Wounding Event: Diabetic Wound/Ulcer of the Lower N/A N/A Primary Etiology: Extremity Chronic sinus problems/congestion, N/A N/A Comorbid History: Middle ear problems, Anemia, Chronic Obstructive Pulmonary Disease (COPD), Congestive Heart Failure, Type II Diabetes, End Stage Renal Disease, History of pressure wounds, Neuropathy 03/16/2020 N/A N/A Date Acquired: 10 N/A N/A Weeks of Treatment: Open N/A N/A Wound Status: Yes N/A N/A Wound Recurrence: 0.4x0.5x0.2 N/A N/A Measurements L x W x D (cm) 0.157 N/A N/A A (cm) : rea 0.031 N/A N/A Volume (cm) : 96.70% N/A N/A % Reduction in A rea: 96.70% N/A N/A % Reduction in Volume: Grade 3 N/A N/A Classification: Medium N/A N/A Exudate A mount: Serosanguineous N/A N/A Exudate Type: red, brown N/A N/A Exudate Color: Flat and Intact N/A N/A Wound Margin: None Present (0%) N/A N/A Granulation A  mount: None Present (0%) N/A N/A Necrotic A mount: Fat Layer (Subcutaneous Tissue): Yes N/A N/A Exposed Structures: Fascia: No Tendon: No Muscle: No Joint: No Bone: No Large (67-100%) N/A N/A Epithelialization: Treatment Notes Electronic Signature(s) Signed: 07/25/2023 8:28:10 AM By: [name] Entered By: [name] on 07/24/2023 12:22:32 -------------------------------------------------------------------------------- Multi-Disciplinary Care Plan Details Patient Name: Date of Service: [name] 07/24/2023 2:45 PM Medical Record Number: [id number] Patient Account Number: [id number] Date of Birth/Sex: Treating RN: November 30, 1975 (47 y.o. [name] Primary Care Prisila Dlouhy: [name] Other Clinician: Haidyn, Copley A ([id number]) [id number] Page 5 of 8 Referring Jahnay Lantier: Treating Hortense Cantrall/Extender: [name] in Treatment: [phone]  Active Inactive Abuse / Safety / Falls / Self Care Management Nursing Diagnoses: History of Falls Potential for falls Potential for injury related to falls Goals: Patient will not develop complications from immobility Date Initiated: 03/17/2022 Date Inactivated: 04/20/2022 Target Resolution Date: 03/16/2022 Goal Status: Met Patient/caregiver will verbalize understanding of skin care regimen Date Initiated: 03/17/2022 Date Inactivated: 09/28/2022 Target Resolution Date: 03/16/2022 Goal Status: Met Patient/caregiver will verbalize/demonstrate measure taken to improve self care Date Initiated: 03/17/2022 Target Resolution Date: 06/21/2023 Goal Status: Active Interventions: Assess fall risk on admission and as needed Provide education on basic hygiene Provide education on personal and home safety Notes: Electronic Signature(s) Signed: 07/24/2023 3:42:39 PM By: Yevonne Pax RN Signed: 07/25/2023 8:28:10 AM By: [name] Entered By: [name] on 07/24/2023  12:26:28 -------------------------------------------------------------------------------- Pain Assessment Details Patient Name: Date of Service: [name] 07/24/2023 2:45 PM Medical Record Number: [id number] Patient Account Number: [id number] Date of Birth/Sex: Treating RN: [birth year] (47 y.o. [name] Primary Care Ashly Yepez: [name] Other Clinician: [name] Referring Nalany Steedley: Treating Wylodean [name]/Extender: [name] Weeks in Treatment: 27 Active Problems Location of Pain Severity and Description of Pain Patient Has Paino No Site Locations [place], [place] A ([id number]) [id number] Page 6 of 8 Pain Management and Medication Current Pain Management: Electronic Signature(s) Signed: 07/24/2023 3:42:39 PM By: Yevonne Pax RN Signed: 07/25/2023 8:28:10 AM By: [name] Entered By: [name] on 07/24/2023 11:55:18 -------------------------------------------------------------------------------- Patient/Caregiver Education Details Patient Name: Date of Service: [name] 9/30/2024andnbsp2:45 PM Medical Record Number: [id number] Patient Account Number: [id number] Date of Birth/Gender: Treating RN: 19-Sep-1976 (47 y.o. [name] Primary Care Physician: [name] Other Clinician: [name] Referring Physician: Treating Physician/Extender: [name] in Treatment: 24 Education Assessment Education Provided To: Patient Education Topics Provided Wound/Skin Impairment: Handouts: Other: continue wound care as directed Methods: Explain/Verbal Responses: State content correctly Electronic Signature(s) Signed: 07/25/2023 8:28:10 AM By: [name] Entered By: [name] on 07/24/2023 13:03:15 Earmon Phoenix A ([id number]) [id number] Page 7 of 8 -------------------------------------------------------------------------------- Wound  Assessment Details Patient Name: Date of Service: [name], [name] 07/24/2023 2:45 PM Medical Record Number: [id number] Patient Account Number: [id number] Date of Birth/Sex: Treating RN: 1976/07/11 (47 y.o. [name] Primary Care Naveena Eyman: [name] Other Clinician: [name] Referring Anh Bigos: Treating Chasen Mendell/Extender: [name] Weeks in Treatment: 70 Wound Status Wound Number: 12R Primary Diabetic Wound/Ulcer of the Lower Extremity Etiology: Wound Location: Left, Medial, Plantar Foot Wound Open Wounding Event: Pressure Injury Status: Date Acquired: 03/16/2020 Comorbid Chronic sinus problems/congestion, Middle ear problems, Anemia, Weeks Of Treatment: 70 History: Chronic Obstructive Pulmonary Disease (COPD), Congestive Heart Clustered Wound: No Failure, Type II Diabetes, End Stage Renal Disease, History of pressure wounds, Neuropathy Photos Wound Measurements Length: (cm) 0.4 Width: (cm) 0.5 Depth: (cm) 0.2 Area: (cm) 0.15 Volume: (cm) 0.03 % Reduction in Area: 96.7% % Reduction in Volume: 96.7% Epithelialization: Large (67-100%) 7 1 Wound Description Classification: Grade 3 Wound Margin: Flat and Intact Exudate Amount: Medium Exudate Type: Serosanguineous Exudate Color: red, brown Foul Odor After Cleansing: No Slough/Fibrino Yes Wound Bed Granulation Amount: None Present (0%) Exposed Structure Necrotic Amount: None Present (0%) Fascia Exposed: No Fat Layer (Subcutaneous Tissue) Exposed: Yes Tendon Exposed: No Muscle Exposed: No Joint Exposed: No Bone Exposed: No Treatment Notes Wound #12R (Foot) Wound Laterality: Plantar, Left, Medial Cleanser Vashe 5.8 (oz) Discharge Instruction: Use vashe 5.8 (oz) as directed ARACELIE, LAUBY A ([id number]) [id number] Page 8 of 8 Peri-Wound Care Topical  Gentamicin Discharge Instruction: Apply as directed by Gentry Seeber. Primary Dressing Hydrofera Blue Ready  Transfer Foam, 2.5x2.5 (in/in) Discharge Instruction: Apply Hydrofera Blue Ready to wound bed as directed Secondary Dressing ABD Pad 5x9 (in/in) Discharge Instruction: Cover with ABD pad Secured With Compression Wrap Compression Stockings Add-Ons Electronic Signature(s) Signed: 07/24/2023 3:42:39 PM By: Yevonne Pax RN Signed: 07/25/2023 8:28:10 AM By: [name] Entered By: [name] on 07/24/2023 12:10:37 -------------------------------------------------------------------------------- Vitals Details Patient Name: Date of Service: [name] 07/24/2023 2:45 PM Medical Record Number: [id number] Patient Account Number: [id number] Date of Birth/Sex: Treating RN: [birth year] (47 y.o. [name] Primary Care Orhan Mayorga: [name] Other Clinician: [name] Referring Mikenzi Raysor: Treating Erikka Follmer/Extender: [name] in Treatment: 70 Vital Signs Time Taken: 14:53 Temperature (F): 98.5 Height (in): 69 Pulse (bpm): 90 Weight (lbs): 178 Respiratory Rate (breaths/min): 16 Body Mass Index (BMI): 26.3 Blood Pressure (mmHg): 117/70 Reference Range: 80 - 120 mg / dl Electronic Signature(s) Signed: 07/25/2023 8:28:10 AM By: [name] Entered By: [name] on 07/24/2023 11:55:00

## 2023-07-27 ENCOUNTER — Encounter: Payer: MEDICAID | Admitting: Physician Assistant

## 2023-08-01 ENCOUNTER — Encounter: Payer: MEDICAID | Attending: Physician Assistant | Admitting: Physician Assistant

## 2023-08-01 DIAGNOSIS — L84 Corns and callosities: Secondary | ICD-10-CM | POA: Insufficient documentation

## 2023-08-01 DIAGNOSIS — L97528 Non-pressure chronic ulcer of other part of left foot with other specified severity: Secondary | ICD-10-CM | POA: Diagnosis not present

## 2023-08-01 DIAGNOSIS — E1142 Type 2 diabetes mellitus with diabetic polyneuropathy: Secondary | ICD-10-CM | POA: Insufficient documentation

## 2023-08-01 DIAGNOSIS — J449 Chronic obstructive pulmonary disease, unspecified: Secondary | ICD-10-CM | POA: Diagnosis not present

## 2023-08-01 DIAGNOSIS — I132 Hypertensive heart and chronic kidney disease with heart failure and with stage 5 chronic kidney disease, or end stage renal disease: Secondary | ICD-10-CM | POA: Diagnosis not present

## 2023-08-01 DIAGNOSIS — E1122 Type 2 diabetes mellitus with diabetic chronic kidney disease: Secondary | ICD-10-CM | POA: Diagnosis not present

## 2023-08-01 DIAGNOSIS — I5022 Chronic systolic (congestive) heart failure: Secondary | ICD-10-CM | POA: Diagnosis not present

## 2023-08-01 DIAGNOSIS — F319 Bipolar disorder, unspecified: Secondary | ICD-10-CM | POA: Diagnosis not present

## 2023-08-01 DIAGNOSIS — E11621 Type 2 diabetes mellitus with foot ulcer: Secondary | ICD-10-CM | POA: Diagnosis present

## 2023-08-01 DIAGNOSIS — N186 End stage renal disease: Secondary | ICD-10-CM | POA: Diagnosis not present

## 2023-08-01 DIAGNOSIS — Z99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: Secondary | ICD-10-CM | POA: Insufficient documentation

## 2023-08-01 DIAGNOSIS — E785 Hyperlipidemia, unspecified: Secondary | ICD-10-CM | POA: Diagnosis not present

## 2023-08-01 DIAGNOSIS — I429 Cardiomyopathy, unspecified: Secondary | ICD-10-CM | POA: Diagnosis not present

## 2023-08-01 DIAGNOSIS — G40909 Epilepsy, unspecified, not intractable, without status epilepticus: Secondary | ICD-10-CM | POA: Insufficient documentation

## 2023-08-01 DIAGNOSIS — Z794 Long term (current) use of insulin: Secondary | ICD-10-CM | POA: Insufficient documentation

## 2023-08-01 DIAGNOSIS — L97512 Non-pressure chronic ulcer of other part of right foot with fat layer exposed: Secondary | ICD-10-CM | POA: Insufficient documentation

## 2023-08-01 DIAGNOSIS — Z79899 Other long term (current) drug therapy: Secondary | ICD-10-CM | POA: Insufficient documentation

## 2023-08-01 DIAGNOSIS — M21372 Foot drop, left foot: Secondary | ICD-10-CM | POA: Insufficient documentation

## 2023-08-01 NOTE — Progress Notes (Addendum)
0 Ankle / Brachial Index (ABI) - do not check if billed separately Has the patient been seen at the hospital within the last three years: Yes Total Score: 0 Level Of Care: ____ Electronic Signature(s) Signed: 08/01/2023 1:41:37 PM By: [name] Entered By: [name] on 08/01/2023 10:10:33 -------------------------------------------------------------------------------- Encounter Discharge Information Details Patient Name: Date of Service: [name] 08/01/2023 9:00 A M Medical Record Number: [id number] Patient Account Number: [id number] Date of Birth/Sex: Treating RN: 03/23/1976 (47 y.o. Arley, Garant, Dylana A ([id number]) [id number] Page 3 of 8 Primary Care Dwayne Begay: [name] Other Clinician: [name] Referring Parnika Tweten: Treating Jasiel Apachito/Extender: [name] in Treatment: 51 Encounter Discharge Information Items Post Procedure Vitals Discharge Condition: Stable Temperature (F): 98.4 Ambulatory Status: Wheelchair Pulse (bpm): 88 Discharge Destination: Home Respiratory Rate (breaths/min): 16 Transportation: Private Auto Blood Pressure (mmHg): 110/73 Accompanied By: mother Schedule Follow-up Appointment: Yes Clinical Summary of Care: Electronic Signature(s) Signed: 08/01/2023 1:41:37 PM By: [name] Entered By: [name] on 08/01/2023 10:00:43 -------------------------------------------------------------------------------- Lower Extremity Assessment Details Patient Name: Date of Service: HATSUKO, BIZZARRO 08/01/2023 9:00 A M Medical Record Number: [id number] Patient Account Number: [id number] Date of Birth/Sex: Treating RN: 12-31-1975 (47 y.o. Ginette Pitman Primary Care Allia Wiltsey: [name] Other  Clinician: [name] Referring Nathanyl Andujo: Treating Ozell Ferrera/Extender: [name] Weeks in Treatment: 13 Electronic Signature(s) Signed: 08/01/2023 1:41:37 PM By: [name] Signed: 08/01/2023 5:02:18 PM By: Midge Aver MSN RN CNS WTA Entered By: [name] on 08/01/2023 09:12:40 -------------------------------------------------------------------------------- Multi Wound Chart Details Patient Name: Date of Service: [name] 08/01/2023 9:00 A M Medical Record Number: [id number] Patient Account Number: [id number] Date of Birth/Sex: Treating RN: [birth year] (47 y.o. Ginette Pitman Primary Care Vicie Cech: [name] Other Clinician: [name] Referring Denette Hass: Treating Rakeb Kibble/Extender: [name] Weeks in Treatment: 81 Vital Signs Height(in): 69 Pulse(bpm): 88 Weight(lbs): 178 Blood Pressure(mmHg): 110/73 Body Mass Index(BMI): 26.3 Temperature(F): 98.4 Respiratory Rate(breaths/min): 16 Savant, Haven A ([id number]) [id number] Page 4 of 8 [12R:Photos:] [N/A:N/A] Left, Medial, Plantar Foot N/A N/A Wound Location: Pressure Injury N/A N/A Wounding Event: Diabetic Wound/Ulcer of the Lower N/A N/A Primary Etiology: Extremity Chronic sinus problems/congestion, N/A N/A Comorbid History: Middle ear problems, Anemia, Chronic Obstructive Pulmonary Disease (COPD), Congestive Heart Failure, Type II Diabetes, End Stage Renal Disease, History of pressure wounds, Neuropathy 03/16/2020 N/A N/A Date Acquired: 32 N/A N/A Weeks of Treatment: Open N/A N/A Wound Status: Yes N/A N/A Wound Recurrence: 0.4x0.3x0.1 N/A N/A Measurements L x W x D (cm) 0.094 N/A N/A A (cm) : rea 0.009 N/A N/A Volume (cm) : 98.00% N/A N/A % Reduction in A rea: 99.00% N/A N/A % Reduction in Volume: Grade 3 N/A N/A Classification: Medium N/A N/A Exudate A mount: Serosanguineous N/A N/A Exudate Type: red, brown  N/A N/A Exudate Color: Flat and Intact N/A N/A Wound Margin: None Present (0%) N/A N/A Granulation A mount: None Present (0%) N/A N/A Necrotic A mount: Fat Layer (Subcutaneous Tissue): Yes N/A N/A Exposed Structures: Fascia: No Tendon: No Muscle: No Joint: No Bone: No Large (67-100%) N/A N/A Epithelialization: Treatment Notes Electronic Signature(s) Signed: 08/01/2023 1:41:37 PM By: [name] Entered By: [name] on 08/01/2023 09:12:45 -------------------------------------------------------------------------------- Multi-Disciplinary Care Plan Details Patient Name: Date of Service: [name] 08/01/2023 9:00 A M Medical Record Number: [id number] Patient Account Number: [id number] Date of Birth/Sex: Treating RN: 1976/01/01 (47 y.o. Ginette Pitman Primary Care Calob Baskette: [name] Other Clinician: [name]  0 Ankle / Brachial Index (ABI) - do not check if billed separately Has the patient been seen at the hospital within the last three years: Yes Total Score: 0 Level Of Care: ____ Electronic Signature(s) Signed: 08/01/2023 1:41:37 PM By: [name] Entered By: [name] on 08/01/2023 10:10:33 -------------------------------------------------------------------------------- Encounter Discharge Information Details Patient Name: Date of Service: [name] 08/01/2023 9:00 A M Medical Record Number: [id number] Patient Account Number: [id number] Date of Birth/Sex: Treating RN: 03/23/1976 (47 y.o. Arley, Garant, Dylana A ([id number]) [id number] Page 3 of 8 Primary Care Dwayne Begay: [name] Other Clinician: [name] Referring Parnika Tweten: Treating Jasiel Apachito/Extender: [name] in Treatment: 51 Encounter Discharge Information Items Post Procedure Vitals Discharge Condition: Stable Temperature (F): 98.4 Ambulatory Status: Wheelchair Pulse (bpm): 88 Discharge Destination: Home Respiratory Rate (breaths/min): 16 Transportation: Private Auto Blood Pressure (mmHg): 110/73 Accompanied By: mother Schedule Follow-up Appointment: Yes Clinical Summary of Care: Electronic Signature(s) Signed: 08/01/2023 1:41:37 PM By: [name] Entered By: [name] on 08/01/2023 10:00:43 -------------------------------------------------------------------------------- Lower Extremity Assessment Details Patient Name: Date of Service: HATSUKO, BIZZARRO 08/01/2023 9:00 A M Medical Record Number: [id number] Patient Account Number: [id number] Date of Birth/Sex: Treating RN: 12-31-1975 (47 y.o. Ginette Pitman Primary Care Allia Wiltsey: [name] Other  Clinician: [name] Referring Nathanyl Andujo: Treating Ozell Ferrera/Extender: [name] Weeks in Treatment: 13 Electronic Signature(s) Signed: 08/01/2023 1:41:37 PM By: [name] Signed: 08/01/2023 5:02:18 PM By: Midge Aver MSN RN CNS WTA Entered By: [name] on 08/01/2023 09:12:40 -------------------------------------------------------------------------------- Multi Wound Chart Details Patient Name: Date of Service: [name] 08/01/2023 9:00 A M Medical Record Number: [id number] Patient Account Number: [id number] Date of Birth/Sex: Treating RN: [birth year] (47 y.o. Ginette Pitman Primary Care Vicie Cech: [name] Other Clinician: [name] Referring Denette Hass: Treating Rakeb Kibble/Extender: [name] Weeks in Treatment: 81 Vital Signs Height(in): 69 Pulse(bpm): 88 Weight(lbs): 178 Blood Pressure(mmHg): 110/73 Body Mass Index(BMI): 26.3 Temperature(F): 98.4 Respiratory Rate(breaths/min): 16 Savant, Haven A ([id number]) [id number] Page 4 of 8 [12R:Photos:] [N/A:N/A] Left, Medial, Plantar Foot N/A N/A Wound Location: Pressure Injury N/A N/A Wounding Event: Diabetic Wound/Ulcer of the Lower N/A N/A Primary Etiology: Extremity Chronic sinus problems/congestion, N/A N/A Comorbid History: Middle ear problems, Anemia, Chronic Obstructive Pulmonary Disease (COPD), Congestive Heart Failure, Type II Diabetes, End Stage Renal Disease, History of pressure wounds, Neuropathy 03/16/2020 N/A N/A Date Acquired: 32 N/A N/A Weeks of Treatment: Open N/A N/A Wound Status: Yes N/A N/A Wound Recurrence: 0.4x0.3x0.1 N/A N/A Measurements L x W x D (cm) 0.094 N/A N/A A (cm) : rea 0.009 N/A N/A Volume (cm) : 98.00% N/A N/A % Reduction in A rea: 99.00% N/A N/A % Reduction in Volume: Grade 3 N/A N/A Classification: Medium N/A N/A Exudate A mount: Serosanguineous N/A N/A Exudate Type: red, brown  N/A N/A Exudate Color: Flat and Intact N/A N/A Wound Margin: None Present (0%) N/A N/A Granulation A mount: None Present (0%) N/A N/A Necrotic A mount: Fat Layer (Subcutaneous Tissue): Yes N/A N/A Exposed Structures: Fascia: No Tendon: No Muscle: No Joint: No Bone: No Large (67-100%) N/A N/A Epithelialization: Treatment Notes Electronic Signature(s) Signed: 08/01/2023 1:41:37 PM By: [name] Entered By: [name] on 08/01/2023 09:12:45 -------------------------------------------------------------------------------- Multi-Disciplinary Care Plan Details Patient Name: Date of Service: [name] 08/01/2023 9:00 A M Medical Record Number: [id number] Patient Account Number: [id number] Date of Birth/Sex: Treating RN: 1976/01/01 (47 y.o. Ginette Pitman Primary Care Calob Baskette: [name] Other Clinician: [name]  Referring Terrance Usery: Treating Reiley Keisler/Extender: [name] in Treatment: [address] SWEET, JARVIS A ([id number]) [id number] Page 5 of 8 Abuse / Safety / Falls / Self Care Management Nursing Diagnoses: History of Falls Potential for falls Potential for injury related to falls Goals: Patient will not develop complications from immobility Date Initiated: 03/17/2022 Date Inactivated: 04/20/2022 Target Resolution Date: 03/16/2022 Goal Status: Met Patient/caregiver will verbalize understanding of skin care regimen Date Initiated: 03/17/2022 Date Inactivated: 09/28/2022 Target Resolution Date: 03/16/2022 Goal Status: Met Patient/caregiver will verbalize/demonstrate measure taken to improve self care Date Initiated: 03/17/2022 Target Resolution Date: 06/21/2023 Goal Status: Active Interventions: Assess fall risk on admission and as needed Provide education on basic hygiene Provide education on personal and home safety Notes: Electronic Signature(s) Signed: 08/01/2023 1:41:37 PM By: [name] Signed: 08/01/2023 5:02:18 PM By: Midge Aver MSN RN CNS WTA Entered By: [name] on 08/01/2023 09:46:12 -------------------------------------------------------------------------------- Pain Assessment Details Patient Name: Date of Service: [name] 08/01/2023 9:00 A M Medical Record Number: [id number] Patient Account Number: [id number] Date of Birth/Sex: Treating RN: 1976/07/27 (47 y.o. Ginette Pitman Primary Care Ananiah Maciolek: [name] Other Clinician: [name] Referring Tangelia Sanson: Treating Mark Hassey/Extender: [name] Weeks in Treatment: 25 Active Problems Location of Pain Severity and Description of Pain Patient Has Paino No Site Locations [place], [place] A ([id number]) [id number] Page 6 of 8 Pain Management and Medication Current Pain Management: Electronic Signature(s) Signed: 08/01/2023 1:41:37 PM By: [name] Signed: 08/01/2023 5:02:18 PM By: Midge Aver MSN RN CNS WTA Entered By: [name] on 08/01/2023 09:04:25 -------------------------------------------------------------------------------- Patient/Caregiver Education Details Patient Name: Date of Service: [name] 10/8/2024andnbsp9:00 A M Medical Record Number: [id number] Patient Account Number: [id number] Date of Birth/Gender: Treating RN: 1975/11/08 (47 y.o. Ginette Pitman Primary Care Physician: [name] Other Clinician: [name] Referring Physician: Treating Physician/Extender: [name] in Treatment: 82 Education Assessment Education Provided To: Patient Education Topics Provided Wound/Skin Impairment: Handouts: Other: continue wound care as directed Methods: Explain/Verbal Responses: State content correctly Electronic Signature(s) Signed: 08/01/2023 1:41:37 PM By: [name] Entered By: [name] on 08/01/2023 09:46:33 Earmon Phoenix A ([id number])  [id number] Page 7 of 8 -------------------------------------------------------------------------------- Wound Assessment Details Patient Name: Date of Service: PARA, COSSEY 08/01/2023 9:00 A M Medical Record Number: [id number] Patient Account Number: [id number] Date of Birth/Sex: Treating RN: August 30, 1976 (47 y.o. Ginette Pitman Primary Care Wasim Hurlbut: [name] Other Clinician: [name] Referring Albaro Deviney: Treating Mazin Emma/Extender: [name] Weeks in Treatment: 71 Wound Status Wound Number: [id number] Primary Diabetic Wound/Ulcer of the Lower Extremity Etiology: Wound Location: Left, Medial, Plantar Foot Wound Open Wounding Event: Pressure Injury Status: Date Acquired: 03/16/2020 Comorbid Chronic sinus problems/congestion, Middle ear problems, Anemia, Weeks Of Treatment: 71 History: Chronic Obstructive Pulmonary Disease (COPD), Congestive Heart Clustered Wound: No Failure, Type II Diabetes, End Stage Renal Disease, History of pressure wounds, Neuropathy Photos Wound Measurements Length: (cm) 0.4 Width: (cm) 0.3 Depth: (cm) 0.1 Area: (cm) 0.09 Volume: (cm) 0.00 % Reduction in Area: 98% % Reduction in Volume: 99% Epithelialization: Large (67-100%) 4 9 Wound Description Classification: Grade 3 Wound Margin: Flat and Intact Exudate Amount: Medium Exudate Type: Serosanguineous Exudate Color: red, brown Foul Odor After Cleansing: No Slough/Fibrino Yes Wound Bed Granulation Amount: None Present (0%) Exposed Structure Necrotic Amount: None Present (0%) Fascia Exposed: No Fat Layer (Subcutaneous Tissue) Exposed: Yes Tendon Exposed: No Muscle Exposed: No Joint Exposed: No Bone Exposed: No Treatment Notes Wound #12R (  NAWAL, BURLING A ([id number]) [id number] Page 1 of 8 Visit Report for 08/01/2023 Arrival Information Details Patient Name: Date of Service: LEYAN, BRANDEN 08/01/2023 9:00 A M Medical Record Number: [id number] Patient Account Number: [id number] Date of Birth/Sex: Treating RN: 18-Sep-1976 (47 y.o. Ginette Pitman Primary Care Awais Cobarrubias: [name] Other Clinician: [name] Referring Amen Dargis: Treating Juan Olthoff/Extender: [name] in Treatment: 39 Visit Information History Since Last Visit All ordered tests and consults were completed: No Patient Arrived: Wheel Chair Added or deleted any medications: No Arrival Time: 08:56 Any new allergies or adverse reactions: No Transfer Assistance: EasyPivot Patient Lift Had a fall or experienced change in No Patient Identification Verified: Yes activities of daily living that may affect Secondary Verification Process Completed: Yes risk of falls: Patient Requires Transmission-Based Precautions: No Signs or symptoms of abuse/neglect since last visito No Patient Has Alerts: No Hospitalized since last visit: No Implantable device outside of the clinic excluding No cellular tissue based products placed in the center since last visit: Has Dressing in Place as Prescribed: Yes Has Footwear/Offloading in Place as Prescribed: Yes Left: T Contact Cast otal Pain Present Now: No Electronic Signature(s) Signed: 08/01/2023 1:41:37 PM By: [name] Entered By: [name] on 08/01/2023 09:01:43 -------------------------------------------------------------------------------- Clinic Level of Care Assessment Details Patient Name: Date of Service: ADMIRE, BUNNELL 08/01/2023 9:00 A M Medical Record Number: [id number] Patient Account Number: [id number] Date of Birth/Sex: Treating RN: 09-19-1976 (47 y.o. Ginette Pitman Primary Care Lanasia Porras: [name] Other Clinician: [name] Referring Shelia Magallon: Treating Paulett Kaufhold/Extender: [name] in Treatment: 49 Clinic Level of Care Assessment Items TOOL 1 Quantity Score []  - 0 Use when EandM and Procedure is performed on INITIAL visit ASSESSMENTS - Nursing Assessment / Reassessment []  - 0 General Physical Exam (combine w/ comprehensive assessment (listed just below) when performed on new pt. evalsLUSINE, CORLETT A ([id number]) [id number] Page 2 of 8 []  - 0 Comprehensive Assessment (HX, ROS, Risk Assessments, Wounds Hx, etc.) ASSESSMENTS - Wound and Skin Assessment / Reassessment []  - 0 Dermatologic / Skin Assessment (not related to wound area) ASSESSMENTS - Ostomy and/or Continence Assessment and Care []  - 0 Incontinence Assessment and Management []  - 0 Ostomy Care Assessment and Management (repouching, etc.) PROCESS - Coordination of Care []  - 0 Simple Patient / Family Education for ongoing care []  - 0 Complex (extensive) Patient / Family Education for ongoing care []  - 0 Staff obtains Chiropractor, Records, T Results / Process Orders est []  - 0 Staff telephones HHA, Nursing Homes / Clarify orders / etc []  - 0 Routine Transfer to another Facility (non-emergent condition) []  - 0 Routine Hospital Admission (non-emergent condition) []  - 0 New Admissions / Manufacturing engineer / Ordering NPWT Apligraf, etc. , []  - 0 Emergency Hospital Admission (emergent condition) PROCESS - Special Needs []  - 0 Pediatric / Minor Patient Management []  - 0 Isolation Patient Management []  - 0 Hearing / Language / Visual special needs []  - 0 Assessment of Community assistance (transportation, D/C planning, etc.) []  - 0 Additional assistance / Altered mentation []  - 0 Support Surface(s) Assessment (bed, cushion, seat, etc.) INTERVENTIONS - Miscellaneous []  - 0 External ear exam []  - 0 Patient Transfer (multiple staff / Nurse, adult / Similar devices) []  -  0 Simple Staple / Suture removal (25 or less) []  - 0 Complex Staple / Suture removal (26 or more) []  - 0 Hypo/Hyperglycemic Management (do not check if billed separately) []  -

## 2023-08-01 NOTE — Progress Notes (Addendum)
Type 2 diabetes mellitus with other skin ulcer 05/17/2023 05/17/2023 Electronic Signature(s) Signed: 08/01/2023 9:57:30 AM By: [name] PA-C Previous Signature: 08/01/2023 8:49:27 AM Version By: [name] PA-C Entered By: [name] on 08/01/2023 09:57:30 -------------------------------------------------------------------------------- Progress Note Details Patient Name: Date of Service: [name] 08/01/2023 9:00 A M Medical Record Number: [id number] Patient Account Number: [id number] Date of Birth/Sex: Treating RN: 25-Feb-1976 (47 y.o. Ginette Pitman Primary Care Provider: [name] Other Clinician: [name] Referring Provider: Treating Provider/Extender: [name] in Treatment: 53 Subjective Chief Complaint Information obtained from Patient Left foot ulcer History of Present Illness (HPI) 01/18/18-She is here for initial evaluation of the left great toe ulcer. She is a poor historian in regards to timeframe in detail. She states approximately 4 weeks ago she lacerated her toe on something in the house. She followed up with her primary care who placed her on Bactrim and ultimately a second dose of Bactrim prior to coming to wound clinic. She states she has been treating the toe with peroxide, Betadine and a Band-Aid. She did not check her blood sugar this Grinnell, Veroncia A ([id number]) [phone].pdf Page 13 of 22 morning but checked it yesterday morning it was 327; she is unaware of a recent A1c and there are no current records. She saw Dr. she would've orthopedics last week for an old injury to the left ankle, she states he did not see her toe, nor did she bring it to his attention. She smokes approximately 1 pack cigarettes a day. Her social situation is concerning, she arrives this morning with her mother who appears extremely intoxicated/under the influence; her mother was asked to leave the room and be monitored by the patient's grandmother. The patient's aunt then accompanied the patient and the room throughout the rest of the appointment. We had a lengthy discussion regarding the deleterious effects of uncontrolled hyperglycemia and smoking as it relates to wound healing and overall health. She was strongly encouraged to decrease her smoking and get her diabetes under better control. She states she is currently on a diet and has cut down her [facility] consumption. The left toe is erythematous, macerated and slightly edematous with malodor present. The edema in her left foot is below her baseline, there is no erythema streaking. We will treat her with Santyl, doxycycline; we have ordered and xray,  culture and provided a Peg assist surgical shoe and cultured the wound. 01/25/18-She is here in follow-up evaluation for a left great toe ulcer and presents with an abscess to her suprapubic area. She states her blood sugars remain elevated, feeling "sick" and if levels are below 250, but she is trying. She has made no attempt to decrease her smoking stating that we "can't take away her food in her cigarettes". She has been compliant with offloading using the PEG assist you. She is using Santyl daily. the culture obtained last week grew staph aureus and Enterococcus faecalis; continues on the doxycycline and Augmentin was added on Monday. The suprapubic area has erythema, no femoral variation, purple discoloration, minimal induration, was accessed with a cotton tip applicator with sanguinopurulent drainage, this was cultured, I suspect the current antibiotic treatment will cover and we will not add anything to her current treatment plan. She was advised to go to urgent care or ER with any change in redness, induration or fever. 02/01/18-She is here in follow-up evaluation for left great toe ulcers and a new abdominal abscess from last week. She  Type 2 diabetes mellitus with other skin ulcer 05/17/2023 05/17/2023 Electronic Signature(s) Signed: 08/01/2023 9:57:30 AM By: [name] PA-C Previous Signature: 08/01/2023 8:49:27 AM Version By: [name] PA-C Entered By: [name] on 08/01/2023 09:57:30 -------------------------------------------------------------------------------- Progress Note Details Patient Name: Date of Service: [name] 08/01/2023 9:00 A M Medical Record Number: [id number] Patient Account Number: [id number] Date of Birth/Sex: Treating RN: 25-Feb-1976 (47 y.o. Ginette Pitman Primary Care Provider: [name] Other Clinician: [name] Referring Provider: Treating Provider/Extender: [name] in Treatment: 53 Subjective Chief Complaint Information obtained from Patient Left foot ulcer History of Present Illness (HPI) 01/18/18-She is here for initial evaluation of the left great toe ulcer. She is a poor historian in regards to timeframe in detail. She states approximately 4 weeks ago she lacerated her toe on something in the house. She followed up with her primary care who placed her on Bactrim and ultimately a second dose of Bactrim prior to coming to wound clinic. She states she has been treating the toe with peroxide, Betadine and a Band-Aid. She did not check her blood sugar this Grinnell, Veroncia A ([id number]) [phone].pdf Page 13 of 22 morning but checked it yesterday morning it was 327; she is unaware of a recent A1c and there are no current records. She saw Dr. she would've orthopedics last week for an old injury to the left ankle, she states he did not see her toe, nor did she bring it to his attention. She smokes approximately 1 pack cigarettes a day. Her social situation is concerning, she arrives this morning with her mother who appears extremely intoxicated/under the influence; her mother was asked to leave the room and be monitored by the patient's grandmother. The patient's aunt then accompanied the patient and the room throughout the rest of the appointment. We had a lengthy discussion regarding the deleterious effects of uncontrolled hyperglycemia and smoking as it relates to wound healing and overall health. She was strongly encouraged to decrease her smoking and get her diabetes under better control. She states she is currently on a diet and has cut down her [facility] consumption. The left toe is erythematous, macerated and slightly edematous with malodor present. The edema in her left foot is below her baseline, there is no erythema streaking. We will treat her with Santyl, doxycycline; we have ordered and xray,  culture and provided a Peg assist surgical shoe and cultured the wound. 01/25/18-She is here in follow-up evaluation for a left great toe ulcer and presents with an abscess to her suprapubic area. She states her blood sugars remain elevated, feeling "sick" and if levels are below 250, but she is trying. She has made no attempt to decrease her smoking stating that we "can't take away her food in her cigarettes". She has been compliant with offloading using the PEG assist you. She is using Santyl daily. the culture obtained last week grew staph aureus and Enterococcus faecalis; continues on the doxycycline and Augmentin was added on Monday. The suprapubic area has erythema, no femoral variation, purple discoloration, minimal induration, was accessed with a cotton tip applicator with sanguinopurulent drainage, this was cultured, I suspect the current antibiotic treatment will cover and we will not add anything to her current treatment plan. She was advised to go to urgent care or ER with any change in redness, induration or fever. 02/01/18-She is here in follow-up evaluation for left great toe ulcers and a new abdominal abscess from last week. She  to Left Lower Extremity otal Open toe surgical shoe - right foot WOUND #12R: - Foot Wound Laterality: Plantar, Left, Medial Cleanser: Vashe 5.8 (oz) Discharge Instructions: Use vashe 5.8 (oz) as directed Topical: Gentamicin Discharge Instructions: Apply as directed by provider. Prim Dressing: Hydrofera Blue Ready Transfer Foam, 2.5x2.5 (in/in) ary Discharge Instructions: Apply Hydrofera Blue Ready to wound bed as directed Secondary Dressing: ABD Pad 5x9 (in/in) Discharge Instructions: Cover  with ABD pad 1. Based on what I am seeing I do believe that the patient is doing quite well with regard to her her right heel. I did perform some of the callus paring in order to clear away the thicker callus that does not crack open end up causing additional wound she is in agreement with this plan and post clearing away of that callus she seems to be doing much better I think she should continue with the urea cream. 2. I am also going to recommend that she continue with the [facility] Blue to the left heel and subsequently total contact casting along with the gentamicin. 3. Muscle can recommend she should continue to try to stay off her feet is much as possible she does that and it obviously shows she is doing a really good job here. We will see patient back for reevaluation in 1 week here in the clinic. If anything worsens or changes patient will contact our office for additional recommendations. Electronic Signature(s) Signed: 08/01/2023 10:00:18 AM By: [name] PA-C Previous Signature: 08/01/2023 9:58:30 AM Version By: [name] PA-C Entered By: [name] on 08/01/2023 10:00:17 Tobey Grim ([id number]) [id number] Page 21 of 22 -------------------------------------------------------------------------------- Total Contact Cast Details Patient Name: Date of Service: LOLETTA, HARPER 08/01/2023 9:00 A M Medical Record Number: [id number] Patient Account Number: [id number] Date of Birth/Sex: Treating RN: [birth year] (47 y.o. Ginette Pitman Primary Care Provider: [name] Other Clinician: [name] Referring Provider: Treating Provider/Extender: [name] Weeks in Treatment: 69 T Contact Cast Applied for Wound Assessment: otal Wound #12R Left,Medial,Plantar Foot Performed By: Physician [name], PA-C Post Procedure Diagnosis Same as Pre-procedure Notes Patient tolerated procedure well Electronic Signature(s) Signed:  08/01/2023 11:40:05 AM By: [name] PA-C Signed: 08/01/2023 1:41:37 PM By: [name] Entered By: [name] on 08/01/2023 10:10:01 -------------------------------------------------------------------------------- SuperBill Details Patient Name: Date of Service: [name] 08/01/2023 Medical Record Number: [id number] Patient Account Number: [id number] Date of Birth/Sex: Treating RN: 1976/08/10 (47 y.o. 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 Z79.4 Long term (current) use of insulin N18.6 End stage renal disease Z99.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 L84 Corns and callosities Facility Procedures : MARNIE, FAZZINO Code: [id number] RISTI A ([id number]) I Description: 11042 - DEB SUBQ TISSUE 20 SQ CM/< [phone] CD-10 Diagnosis Description 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 Alert and Oriented x 3. pleasant and cooperative. Notes Upon inspection I did perform debridement of the left heel clearway callus slough and subcutaneous tissue down to healthy tissue and she tolerated that today without complication overall I am extremely pleased with where we stand in that regard. With regard to the right heel actually did remove some of the callus and performed that callus paring today in order to help alleviate some of the pressure on the heel and ensure this did not crack open and worsen yet again. She tolerated that without complication. Electronic Signature(s) Signed: 08/01/2023 9:57:38 AM By: [name] PA-C Entered By: [name] on 08/01/2023 09:57:38 -------------------------------------------------------------------------------- Physician Orders Details Patient Name: Date of Service: [name] 08/01/2023 9:00 A M Medical Record Number: [id number] Patient Account Number: [id number] Date of Birth/Sex: Treating RN: 27-Jun-1976 (47 y.o. 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 Bathing/ Shower/ Hygiene May shower; gently cleanse wound with antibacterial soap, rinse and pat dry prior to dressing wounds No tub bath. Off-Loading Total Contact Cast to Left Lower Extremity Open toe surgical shoe - right foot Wound Treatment Wound #12R - Foot Wound Laterality: Plantar, Left, Medial Cleanser: Vashe 5.8 (oz) Discharge Instructions: Use vashe 5.8 (oz) as directed Topical: Gentamicin Discharge Instructions: Apply as directed by provider. Prim Dressing: Hydrofera Blue Ready Transfer Foam, 2.5x2.5 (in/in) ary Discharge Instructions: Apply Hydrofera Blue Ready to wound bed as directed Secondary Dressing: ABD Pad 5x9 (in/in) Discharge Instructions: Cover with ABD pad Electronic Signature(s) Signed: 08/01/2023 11:40:05 AM By: [name] PA-C Signed: 08/01/2023 1:41:37 PM By: [name] Entered By: [name] on 08/01/2023 10:10:21 -------------------------------------------------------------------------------- Problem List Details Patient Name: Date of Service: [name] 08/01/2023 9:00 A M Medical Record Number: [id number] Patient Account Number: [id number] Date of Birth/Sex: Treating RN: 1976/01/09 (47 y.o. 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 03/16/2022 No Yes severity E11.42 Type 2 diabetes mellitus with diabetic polyneuropathy 03/16/2022 No Yes Z79.4 Long term (current) use of insulin 02/22/2023 No Yes Esham, Emmerson A ([id number]) [id number].pdf Page 12 of 22 N18.6 End stage renal disease 07/14/2023 No Yes Z99.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 07/14/2023 No Yes L84 Corns and callosities 08/01/2023 No Yes Inactive Problems Resolved Problems ICD-10 Code Description Active Date Resolved Date L97.512 Non-pressure chronic ulcer of other part of right foot with fat layer exposed 03/16/2022 03/16/2022 L97.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 of skin 03/16/2022 03/16/2022 W54.0XXA Bitten by dog, initial encounter 05/17/2023 05/17/2023 S81.801A Unspecified open wound, right lower leg, initial encounter 05/17/2023 05/17/2023 G25.427  Type 2 diabetes mellitus with other skin ulcer 05/17/2023 05/17/2023 Electronic Signature(s) Signed: 08/01/2023 9:57:30 AM By: [name] PA-C Previous Signature: 08/01/2023 8:49:27 AM Version By: [name] PA-C Entered By: [name] on 08/01/2023 09:57:30 -------------------------------------------------------------------------------- Progress Note Details Patient Name: Date of Service: [name] 08/01/2023 9:00 A M Medical Record Number: [id number] Patient Account Number: [id number] Date of Birth/Sex: Treating RN: 25-Feb-1976 (47 y.o. Ginette Pitman Primary Care Provider: [name] Other Clinician: [name] Referring Provider: Treating Provider/Extender: [name] in Treatment: 53 Subjective Chief Complaint Information obtained from Patient Left foot ulcer History of Present Illness (HPI) 01/18/18-She is here for initial evaluation of the left great toe ulcer. She is a poor historian in regards to timeframe in detail. She states approximately 4 weeks ago she lacerated her toe on something in the house. She followed up with her primary care who placed her on Bactrim and ultimately a second dose of Bactrim prior to coming to wound clinic. She states she has been treating the toe with peroxide, Betadine and a Band-Aid. She did not check her blood sugar this Grinnell, Veroncia A ([id number]) [phone].pdf Page 13 of 22 morning but checked it yesterday morning it was 327; she is unaware of a recent A1c and there are no current records. She saw Dr. she would've orthopedics last week for an old injury to the left ankle, she states he did not see her toe, nor did she bring it to his attention. She smokes approximately 1 pack cigarettes a day. Her social situation is concerning, she arrives this morning with her mother who appears extremely intoxicated/under the influence; her mother was asked to leave the room and be monitored by the patient's grandmother. The patient's aunt then accompanied the patient and the room throughout the rest of the appointment. We had a lengthy discussion regarding the deleterious effects of uncontrolled hyperglycemia and smoking as it relates to wound healing and overall health. She was strongly encouraged to decrease her smoking and get her diabetes under better control. She states she is currently on a diet and has cut down her [facility] consumption. The left toe is erythematous, macerated and slightly edematous with malodor present. The edema in her left foot is below her baseline, there is no erythema streaking. We will treat her with Santyl, doxycycline; we have ordered and xray,  culture and provided a Peg assist surgical shoe and cultured the wound. 01/25/18-She is here in follow-up evaluation for a left great toe ulcer and presents with an abscess to her suprapubic area. She states her blood sugars remain elevated, feeling "sick" and if levels are below 250, but she is trying. She has made no attempt to decrease her smoking stating that we "can't take away her food in her cigarettes". She has been compliant with offloading using the PEG assist you. She is using Santyl daily. the culture obtained last week grew staph aureus and Enterococcus faecalis; continues on the doxycycline and Augmentin was added on Monday. The suprapubic area has erythema, no femoral variation, purple discoloration, minimal induration, was accessed with a cotton tip applicator with sanguinopurulent drainage, this was cultured, I suspect the current antibiotic treatment will cover and we will not add anything to her current treatment plan. She was advised to go to urgent care or ER with any change in redness, induration or fever. 02/01/18-She is here in follow-up evaluation for left great toe ulcers and a new abdominal abscess from last week. She  Type 2 diabetes mellitus with other skin ulcer 05/17/2023 05/17/2023 Electronic Signature(s) Signed: 08/01/2023 9:57:30 AM By: [name] PA-C Previous Signature: 08/01/2023 8:49:27 AM Version By: [name] PA-C Entered By: [name] on 08/01/2023 09:57:30 -------------------------------------------------------------------------------- Progress Note Details Patient Name: Date of Service: [name] 08/01/2023 9:00 A M Medical Record Number: [id number] Patient Account Number: [id number] Date of Birth/Sex: Treating RN: 25-Feb-1976 (47 y.o. Ginette Pitman Primary Care Provider: [name] Other Clinician: [name] Referring Provider: Treating Provider/Extender: [name] in Treatment: 53 Subjective Chief Complaint Information obtained from Patient Left foot ulcer History of Present Illness (HPI) 01/18/18-She is here for initial evaluation of the left great toe ulcer. She is a poor historian in regards to timeframe in detail. She states approximately 4 weeks ago she lacerated her toe on something in the house. She followed up with her primary care who placed her on Bactrim and ultimately a second dose of Bactrim prior to coming to wound clinic. She states she has been treating the toe with peroxide, Betadine and a Band-Aid. She did not check her blood sugar this Grinnell, Veroncia A ([id number]) [phone].pdf Page 13 of 22 morning but checked it yesterday morning it was 327; she is unaware of a recent A1c and there are no current records. She saw Dr. she would've orthopedics last week for an old injury to the left ankle, she states he did not see her toe, nor did she bring it to his attention. She smokes approximately 1 pack cigarettes a day. Her social situation is concerning, she arrives this morning with her mother who appears extremely intoxicated/under the influence; her mother was asked to leave the room and be monitored by the patient's grandmother. The patient's aunt then accompanied the patient and the room throughout the rest of the appointment. We had a lengthy discussion regarding the deleterious effects of uncontrolled hyperglycemia and smoking as it relates to wound healing and overall health. She was strongly encouraged to decrease her smoking and get her diabetes under better control. She states she is currently on a diet and has cut down her [facility] consumption. The left toe is erythematous, macerated and slightly edematous with malodor present. The edema in her left foot is below her baseline, there is no erythema streaking. We will treat her with Santyl, doxycycline; we have ordered and xray,  culture and provided a Peg assist surgical shoe and cultured the wound. 01/25/18-She is here in follow-up evaluation for a left great toe ulcer and presents with an abscess to her suprapubic area. She states her blood sugars remain elevated, feeling "sick" and if levels are below 250, but she is trying. She has made no attempt to decrease her smoking stating that we "can't take away her food in her cigarettes". She has been compliant with offloading using the PEG assist you. She is using Santyl daily. the culture obtained last week grew staph aureus and Enterococcus faecalis; continues on the doxycycline and Augmentin was added on Monday. The suprapubic area has erythema, no femoral variation, purple discoloration, minimal induration, was accessed with a cotton tip applicator with sanguinopurulent drainage, this was cultured, I suspect the current antibiotic treatment will cover and we will not add anything to her current treatment plan. She was advised to go to urgent care or ER with any change in redness, induration or fever. 02/01/18-She is here in follow-up evaluation for left great toe ulcers and a new abdominal abscess from last week. She  Type 2 diabetes mellitus with other skin ulcer 05/17/2023 05/17/2023 Electronic Signature(s) Signed: 08/01/2023 9:57:30 AM By: [name] PA-C Previous Signature: 08/01/2023 8:49:27 AM Version By: [name] PA-C Entered By: [name] on 08/01/2023 09:57:30 -------------------------------------------------------------------------------- Progress Note Details Patient Name: Date of Service: [name] 08/01/2023 9:00 A M Medical Record Number: [id number] Patient Account Number: [id number] Date of Birth/Sex: Treating RN: 25-Feb-1976 (47 y.o. Ginette Pitman Primary Care Provider: [name] Other Clinician: [name] Referring Provider: Treating Provider/Extender: [name] in Treatment: 53 Subjective Chief Complaint Information obtained from Patient Left foot ulcer History of Present Illness (HPI) 01/18/18-She is here for initial evaluation of the left great toe ulcer. She is a poor historian in regards to timeframe in detail. She states approximately 4 weeks ago she lacerated her toe on something in the house. She followed up with her primary care who placed her on Bactrim and ultimately a second dose of Bactrim prior to coming to wound clinic. She states she has been treating the toe with peroxide, Betadine and a Band-Aid. She did not check her blood sugar this Grinnell, Veroncia A ([id number]) [phone].pdf Page 13 of 22 morning but checked it yesterday morning it was 327; she is unaware of a recent A1c and there are no current records. She saw Dr. she would've orthopedics last week for an old injury to the left ankle, she states he did not see her toe, nor did she bring it to his attention. She smokes approximately 1 pack cigarettes a day. Her social situation is concerning, she arrives this morning with her mother who appears extremely intoxicated/under the influence; her mother was asked to leave the room and be monitored by the patient's grandmother. The patient's aunt then accompanied the patient and the room throughout the rest of the appointment. We had a lengthy discussion regarding the deleterious effects of uncontrolled hyperglycemia and smoking as it relates to wound healing and overall health. She was strongly encouraged to decrease her smoking and get her diabetes under better control. She states she is currently on a diet and has cut down her [facility] consumption. The left toe is erythematous, macerated and slightly edematous with malodor present. The edema in her left foot is below her baseline, there is no erythema streaking. We will treat her with Santyl, doxycycline; we have ordered and xray,  culture and provided a Peg assist surgical shoe and cultured the wound. 01/25/18-She is here in follow-up evaluation for a left great toe ulcer and presents with an abscess to her suprapubic area. She states her blood sugars remain elevated, feeling "sick" and if levels are below 250, but she is trying. She has made no attempt to decrease her smoking stating that we "can't take away her food in her cigarettes". She has been compliant with offloading using the PEG assist you. She is using Santyl daily. the culture obtained last week grew staph aureus and Enterococcus faecalis; continues on the doxycycline and Augmentin was added on Monday. The suprapubic area has erythema, no femoral variation, purple discoloration, minimal induration, was accessed with a cotton tip applicator with sanguinopurulent drainage, this was cultured, I suspect the current antibiotic treatment will cover and we will not add anything to her current treatment plan. She was advised to go to urgent care or ER with any change in redness, induration or fever. 02/01/18-She is here in follow-up evaluation for left great toe ulcers and a new abdominal abscess from last week. She  Type 2 diabetes mellitus with other skin ulcer 05/17/2023 05/17/2023 Electronic Signature(s) Signed: 08/01/2023 9:57:30 AM By: [name] PA-C Previous Signature: 08/01/2023 8:49:27 AM Version By: [name] PA-C Entered By: [name] on 08/01/2023 09:57:30 -------------------------------------------------------------------------------- Progress Note Details Patient Name: Date of Service: [name] 08/01/2023 9:00 A M Medical Record Number: [id number] Patient Account Number: [id number] Date of Birth/Sex: Treating RN: 25-Feb-1976 (47 y.o. Ginette Pitman Primary Care Provider: [name] Other Clinician: [name] Referring Provider: Treating Provider/Extender: [name] in Treatment: 53 Subjective Chief Complaint Information obtained from Patient Left foot ulcer History of Present Illness (HPI) 01/18/18-She is here for initial evaluation of the left great toe ulcer. She is a poor historian in regards to timeframe in detail. She states approximately 4 weeks ago she lacerated her toe on something in the house. She followed up with her primary care who placed her on Bactrim and ultimately a second dose of Bactrim prior to coming to wound clinic. She states she has been treating the toe with peroxide, Betadine and a Band-Aid. She did not check her blood sugar this Grinnell, Veroncia A ([id number]) [phone].pdf Page 13 of 22 morning but checked it yesterday morning it was 327; she is unaware of a recent A1c and there are no current records. She saw Dr. she would've orthopedics last week for an old injury to the left ankle, she states he did not see her toe, nor did she bring it to his attention. She smokes approximately 1 pack cigarettes a day. Her social situation is concerning, she arrives this morning with her mother who appears extremely intoxicated/under the influence; her mother was asked to leave the room and be monitored by the patient's grandmother. The patient's aunt then accompanied the patient and the room throughout the rest of the appointment. We had a lengthy discussion regarding the deleterious effects of uncontrolled hyperglycemia and smoking as it relates to wound healing and overall health. She was strongly encouraged to decrease her smoking and get her diabetes under better control. She states she is currently on a diet and has cut down her [facility] consumption. The left toe is erythematous, macerated and slightly edematous with malodor present. The edema in her left foot is below her baseline, there is no erythema streaking. We will treat her with Santyl, doxycycline; we have ordered and xray,  culture and provided a Peg assist surgical shoe and cultured the wound. 01/25/18-She is here in follow-up evaluation for a left great toe ulcer and presents with an abscess to her suprapubic area. She states her blood sugars remain elevated, feeling "sick" and if levels are below 250, but she is trying. She has made no attempt to decrease her smoking stating that we "can't take away her food in her cigarettes". She has been compliant with offloading using the PEG assist you. She is using Santyl daily. the culture obtained last week grew staph aureus and Enterococcus faecalis; continues on the doxycycline and Augmentin was added on Monday. The suprapubic area has erythema, no femoral variation, purple discoloration, minimal induration, was accessed with a cotton tip applicator with sanguinopurulent drainage, this was cultured, I suspect the current antibiotic treatment will cover and we will not add anything to her current treatment plan. She was advised to go to urgent care or ER with any change in redness, induration or fever. 02/01/18-She is here in follow-up evaluation for left great toe ulcers and a new abdominal abscess from last week. She  to Left Lower Extremity otal Open toe surgical shoe - right foot WOUND #12R: - Foot Wound Laterality: Plantar, Left, Medial Cleanser: Vashe 5.8 (oz) Discharge Instructions: Use vashe 5.8 (oz) as directed Topical: Gentamicin Discharge Instructions: Apply as directed by provider. Prim Dressing: Hydrofera Blue Ready Transfer Foam, 2.5x2.5 (in/in) ary Discharge Instructions: Apply Hydrofera Blue Ready to wound bed as directed Secondary Dressing: ABD Pad 5x9 (in/in) Discharge Instructions: Cover  with ABD pad 1. Based on what I am seeing I do believe that the patient is doing quite well with regard to her her right heel. I did perform some of the callus paring in order to clear away the thicker callus that does not crack open end up causing additional wound she is in agreement with this plan and post clearing away of that callus she seems to be doing much better I think she should continue with the urea cream. 2. I am also going to recommend that she continue with the [facility] Blue to the left heel and subsequently total contact casting along with the gentamicin. 3. Muscle can recommend she should continue to try to stay off her feet is much as possible she does that and it obviously shows she is doing a really good job here. We will see patient back for reevaluation in 1 week here in the clinic. If anything worsens or changes patient will contact our office for additional recommendations. Electronic Signature(s) Signed: 08/01/2023 10:00:18 AM By: [name] PA-C Previous Signature: 08/01/2023 9:58:30 AM Version By: [name] PA-C Entered By: [name] on 08/01/2023 10:00:17 Tobey Grim ([id number]) [id number] Page 21 of 22 -------------------------------------------------------------------------------- Total Contact Cast Details Patient Name: Date of Service: LOLETTA, HARPER 08/01/2023 9:00 A M Medical Record Number: [id number] Patient Account Number: [id number] Date of Birth/Sex: Treating RN: [birth year] (47 y.o. Ginette Pitman Primary Care Provider: [name] Other Clinician: [name] Referring Provider: Treating Provider/Extender: [name] Weeks in Treatment: 69 T Contact Cast Applied for Wound Assessment: otal Wound #12R Left,Medial,Plantar Foot Performed By: Physician [name], PA-C Post Procedure Diagnosis Same as Pre-procedure Notes Patient tolerated procedure well Electronic Signature(s) Signed:  08/01/2023 11:40:05 AM By: [name] PA-C Signed: 08/01/2023 1:41:37 PM By: [name] Entered By: [name] on 08/01/2023 10:10:01 -------------------------------------------------------------------------------- SuperBill Details Patient Name: Date of Service: [name] 08/01/2023 Medical Record Number: [id number] Patient Account Number: [id number] Date of Birth/Sex: Treating RN: 1976/08/10 (47 y.o. 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 Z79.4 Long term (current) use of insulin N18.6 End stage renal disease Z99.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 L84 Corns and callosities Facility Procedures : MARNIE, FAZZINO Code: [id number] RISTI A ([id number]) I Description: 11042 - DEB SUBQ TISSUE 20 SQ CM/< [phone] CD-10 Diagnosis Description 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 8/14; 8/14; patient has 3 wounds 1 on the left plantar heel 1 on the right plantar heel and a small wound on the right anterior lower leg. We have been using Hydrofera Blue in the left heel Aquacel on the right heel. The left heel has been in a total contact cast 8/21; patient presents for follow-up. We have been using Hydrofera Blue to the left foot wound under the total contact cast. T the right heel patient has been o using Aquacel Ag. T the anterior leg wound antibiotic ointment. All wounds have healed. o 8/28; patient presents for follow-up. She has been using her surgical shoe with offloading felt pads. Her wounds have remained closed. 9/11; patient presents for final follow-up to assure that her wounds have remained closed. Unfortunately the left foot wound has opened again. But she has been wearing her Surgical shoe for offloading with felt pad. 07-14-2023 upon evaluation today patient appears to be doing well currently in regard to her wound. She has been tolerating the dressing changes without complication with total contact casting. I am going to be helping take care of her at this point  as she is transferring from Dr. Mikey Bussing to me since Dr. Mikey Bussing is on maternity leave. 07-24-2023 upon evaluation today patient appears to be doing decently well currently in regard to her wound. She is actually showing signs of improvement in regard to the left foot and I feel like this is much better than last week no debridement even necessary today. We may have to perform some slight debridement come next week we will see how things go. Monitor for any signs of worsening overall and obviously if anything changes she should be letting us know. 08-01-2023 upon evaluation today patient appears to be doing excellent in regard to her feet. The right foot/heel area is completely healed she does have some callus buildup I am going to trim this down a little bit I do not want to build up to the point of cracking she is doing well with the urea cream. With regard to the left heel she is doing well with casting I am going to perform some debridement here to clear away before reapplying the cast and I think she is really doing quite well though. Electronic Signature(s) Signed: 08/01/2023 9:56:55 AM By: [name] PA-C Entered By: [name] on 08/01/2023 09:56:55 -------------------------------------------------------------------------------- Callus Pairing Details Patient Name: Date of Service: [name] 08/01/2023 9:00 A M Medical Record Number: [id number] Patient Account Number: [id number] Date of Birth/Sex: Treating RN: 02-13-1976 (47 y.o. Ginette Pitman Primary Care Provider: [name] Other Clinician: [name] Referring Provider: Treating Provider/Extender: [name] in Treatment: 71 Procedure Performed for: Non-Wound Location Performed By: Physician [name], PA-C Post Procedure Diagnosis Same as Pre-procedure Notes Right heel using a 3mm curette to prevent callus splitting and causing a new wound. Electronic Signature(s) Signed: 08/01/2023  9:59:59 AM By: [name] PA-C Entered By: [name] on 08/01/2023 09:59:59 Earmon Phoenix A ([id number]) [id number] Page 10 of 22 -------------------------------------------------------------------------------- Physical Exam Details Patient Name: Date of Service: KEARSTIN, LEARN 08/01/2023 9:00 A M Medical Record Number: [id number] Patient Account Number: [id number] Date of Birth/Sex: Treating RN: 26-Aug-1976 (47 y.o. Ginette Pitman Primary Care Provider: [name] Other Clinician: [name] Referring Provider: Treating Provider/Extender: [name] Weeks in Treatment: 74 Constitutional Well-nourished and well-hydrated in no acute distress. Respiratory normal breathing without difficulty. Psychiatric this patient is able to make  slowly. 8/14; 8/14; patient has 3 wounds 1 on the left plantar heel 1 on the right plantar heel and a small wound on the right anterior lower leg. We have been using Hydrofera Blue in the left heel Aquacel on the right heel. The left heel has been in a total contact cast 8/21; patient presents for follow-up. We have been using Hydrofera Blue to the left foot wound under the total contact cast. T the right heel patient has been o using Aquacel Ag. T the anterior leg wound antibiotic ointment. All wounds have healed. o 8/28; patient presents for follow-up. She has been using her surgical shoe with offloading felt pads. Her wounds have remained closed. 9/11; patient presents for final follow-up to assure that her wounds have remained closed. Unfortunately the left foot wound has opened again. But she has been wearing her Surgical shoe for offloading with felt pad. 07-14-2023 upon evaluation today patient appears to be doing well currently in regard to her wound. She has been tolerating the dressing changes without complication with total contact casting. I am going to be helping take care of her at this point  as she is transferring from Dr. Mikey Bussing to me since Dr. Mikey Bussing is on maternity leave. 07-24-2023 upon evaluation today patient appears to be doing decently well currently in regard to her wound. She is actually showing signs of improvement in regard to the left foot and I feel like this is much better than last week no debridement even necessary today. We may have to perform some slight debridement come next week we will see how things go. Monitor for any signs of worsening overall and obviously if anything changes she should be letting us know. 08-01-2023 upon evaluation today patient appears to be doing excellent in regard to her feet. The right foot/heel area is completely healed she does have some callus buildup I am going to trim this down a little bit I do not want to build up to the point of cracking she is doing well with the urea cream. With regard to the left heel she is doing well with casting I am going to perform some debridement here to clear away before reapplying the cast and I think she is really doing quite well though. Electronic Signature(s) Signed: 08/01/2023 9:56:55 AM By: [name] PA-C Entered By: [name] on 08/01/2023 09:56:55 -------------------------------------------------------------------------------- Callus Pairing Details Patient Name: Date of Service: [name] 08/01/2023 9:00 A M Medical Record Number: [id number] Patient Account Number: [id number] Date of Birth/Sex: Treating RN: 02-13-1976 (47 y.o. Ginette Pitman Primary Care Provider: [name] Other Clinician: [name] Referring Provider: Treating Provider/Extender: [name] in Treatment: 71 Procedure Performed for: Non-Wound Location Performed By: Physician [name], PA-C Post Procedure Diagnosis Same as Pre-procedure Notes Right heel using a 3mm curette to prevent callus splitting and causing a new wound. Electronic Signature(s) Signed: 08/01/2023  9:59:59 AM By: [name] PA-C Entered By: [name] on 08/01/2023 09:59:59 Earmon Phoenix A ([id number]) [id number] Page 10 of 22 -------------------------------------------------------------------------------- Physical Exam Details Patient Name: Date of Service: KEARSTIN, LEARN 08/01/2023 9:00 A M Medical Record Number: [id number] Patient Account Number: [id number] Date of Birth/Sex: Treating RN: 26-Aug-1976 (47 y.o. Ginette Pitman Primary Care Provider: [name] Other Clinician: [name] Referring Provider: Treating Provider/Extender: [name] Weeks in Treatment: 74 Constitutional Well-nourished and well-hydrated in no acute distress. Respiratory normal breathing without difficulty. Psychiatric this patient is able to make  ALLISHA, HARTER A ([id number]) [id number] Page 1 of 22 Visit Report for 08/01/2023 Chief Complaint Document Details Patient Name: Date of Service: TORRENCE, BRANAGAN 08/01/2023 9:00 A M Medical Record Number: [id number] Patient Account Number: [id number] Date of Birth/Sex: Treating RN: 03-22-1976 (47 y.o. Ginette Pitman Primary Care Provider: [name] Other Clinician: [name] Referring Provider: Treating Provider/Extender: [name] Weeks in Treatment: 44 Information Obtained from: Patient Chief Complaint Left foot ulcer Electronic Signature(s) Signed: 08/01/2023 10:00:37 AM By: [name] PA-C Previous Signature: 08/01/2023 8:49:31 AM Version By: [name] PA-C Entered By: [name] on 08/01/2023 10:00:37 -------------------------------------------------------------------------------- Debridement Details Patient Name: Date of Service: [name] 08/01/2023 9:00 A M Medical Record Number: [id number] Patient Account Number: [id number] Date of Birth/Sex: Treating RN: 1976/02/26 (47 y.o. Ginette Pitman Primary Care Provider: [name] Other Clinician: [name] Referring Provider: Treating Provider/Extender: [name] in Treatment: 71 Debridement Performed for Assessment: Wound #12R Left,Medial,Plantar Foot Performed By: Physician [name], PA-C Debridement Type: Debridement Severity of Tissue Pre Debridement: Fat layer exposed Level of Consciousness (Pre-procedure): Awake and Alert Pre-procedure Verification/Time Out Yes - 09:40 Taken: Start Time: 09:40 Percent of Wound Bed Debrided: 100% T Area Debrided (cm): otal 0.09 Tissue and other material debrided: Viable, Non-Viable, Callus, Slough, Subcutaneous, Slough Level: Skin/Subcutaneous Tissue Debridement Description: Excisional Instrument: Curette Bleeding: Minimum Hemostasis Achieved: Pressure Response to Treatment:  Procedure was tolerated well Level of Consciousness (Post- Awake and Alert MARIADEL, MRUK A ([id number]) [phone].pdf Page 2 of 22 Awake and Alert procedure): Post Debridement Measurements of Total Wound Length: (cm) 0.4 Width: (cm) 0.3 Depth: (cm) 0.1 Volume: (cm) 0.009 Character of Wound/Ulcer Post Debridement: Improved Severity of Tissue Post Debridement: Fat layer exposed Post Procedure Diagnosis Same as Pre-procedure Electronic Signature(s) Signed: 08/01/2023 11:40:05 AM By: [name] PA-C Signed: 08/01/2023 1:41:37 PM By: [name] Signed: 08/01/2023 5:02:18 PM By: Midge Aver MSN RN CNS WTA Entered By: [name] on 08/01/2023 09:42:43 -------------------------------------------------------------------------------- HPI Details Patient Name: Date of Service: [name], Emmelyn A. 08/01/2023 9:00 A M Medical Record Number: [id number] Patient Account Number: [id number] Date of Birth/Sex: Treating RN: 1975/12/12 (47 y.o. Ginette Pitman Primary Care Provider: [name] Other Clinician: [name] Referring Provider: Treating Provider/Extender: [name] in Treatment: 62 History of Present Illness HPI Description: 01/18/18-She is here for initial evaluation of the left great toe ulcer. She is a poor historian in regards to timeframe in detail. She states approximately 4 weeks ago she lacerated her toe on something in the house. She followed up with her primary care who placed her on Bactrim and ultimately a second dose of Bactrim prior to coming to wound clinic. She states she has been treating the toe with peroxide, Betadine and a Band-Aid. She did not check her blood sugar this morning but checked it yesterday morning it was 327; she is unaware of a recent A1c and there are no current records. She saw Dr. she would've orthopedics last week for an old injury to the left ankle, she states he did not see her toe, nor  did she bring it to his attention. She smokes approximately 1 pack cigarettes a day. Her social situation is concerning, she arrives this morning with her mother who appears extremely intoxicated/under the influence; her mother was asked to leave the room and be monitored by the patient's grandmother. The patient's aunt then accompanied the patient and the room throughout the rest of the appointment.  decisions and demonstrates good insight into disease process. Alert and Oriented x 3. pleasant and cooperative. Notes Upon inspection I did perform debridement of the left heel clearway callus slough and subcutaneous tissue down to healthy tissue and she tolerated that today without complication overall I am extremely pleased with where we stand in that regard. With regard to the right heel actually did remove some of the callus and performed that callus paring today in order to help alleviate some of the pressure on the heel and ensure this did not crack open and worsen yet again. She tolerated that without complication. Electronic Signature(s) Signed: 08/01/2023 9:57:38 AM By: [name] PA-C Entered By: [name] on 08/01/2023 09:57:38 -------------------------------------------------------------------------------- Physician Orders Details Patient Name: Date of Service: [name] 08/01/2023 9:00 A M Medical Record Number: [id number] Patient Account Number: [id number] Date of Birth/Sex: Treating RN: 27-Jun-1976 (47 y.o. 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 Bathing/ Shower/ Hygiene May shower; gently cleanse wound with antibacterial soap, rinse and pat dry prior to dressing wounds No tub bath. Off-Loading Total Contact Cast to Left Lower Extremity Open toe surgical shoe - right foot Wound Treatment Wound #12R - Foot Wound Laterality: Plantar, Left, Medial Cleanser: Vashe 5.8 (oz) Discharge Instructions: Use vashe 5.8 (oz) as directed Topical: Gentamicin Discharge Instructions: Apply as directed by provider. Prim Dressing: Hydrofera Blue Ready Transfer Foam, 2.5x2.5 (in/in) ary Discharge Instructions: Apply Hydrofera Blue Ready to wound bed as directed Secondary Dressing: ABD Pad 5x9 (in/in) Discharge Instructions: Cover with ABD pad Electronic Signature(s) Signed: 08/01/2023 11:40:05 AM By: [name] PA-C Signed: 08/01/2023 1:41:37 PM By: [name] Entered By: [name] on 08/01/2023 10:10:21 -------------------------------------------------------------------------------- Problem List Details Patient Name: Date of Service: [name] 08/01/2023 9:00 A M Medical Record Number: [id number] Patient Account Number: [id number] Date of Birth/Sex: Treating RN: 1976/01/09 (47 y.o. 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 03/16/2022 No Yes severity E11.42 Type 2 diabetes mellitus with diabetic polyneuropathy 03/16/2022 No Yes Z79.4 Long term (current) use of insulin 02/22/2023 No Yes Esham, Emmerson A ([id number]) [id number].pdf Page 12 of 22 N18.6 End stage renal disease 07/14/2023 No Yes Z99.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 07/14/2023 No Yes L84 Corns and callosities 08/01/2023 No Yes Inactive Problems Resolved Problems ICD-10 Code Description Active Date Resolved Date L97.512 Non-pressure chronic ulcer of other part of right foot with fat layer exposed 03/16/2022 03/16/2022 L97.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 of skin 03/16/2022 03/16/2022 W54.0XXA Bitten by dog, initial encounter 05/17/2023 05/17/2023 S81.801A Unspecified open wound, right lower leg, initial encounter 05/17/2023 05/17/2023 G25.427  Type 2 diabetes mellitus with other skin ulcer 05/17/2023 05/17/2023 Electronic Signature(s) Signed: 08/01/2023 9:57:30 AM By: [name] PA-C Previous Signature: 08/01/2023 8:49:27 AM Version By: [name] PA-C Entered By: [name] on 08/01/2023 09:57:30 -------------------------------------------------------------------------------- Progress Note Details Patient Name: Date of Service: [name] 08/01/2023 9:00 A M Medical Record Number: [id number] Patient Account Number: [id number] Date of Birth/Sex: Treating RN: 25-Feb-1976 (47 y.o. Ginette Pitman Primary Care Provider: [name] Other Clinician: [name] Referring Provider: Treating Provider/Extender: [name] in Treatment: 53 Subjective Chief Complaint Information obtained from Patient Left foot ulcer History of Present Illness (HPI) 01/18/18-She is here for initial evaluation of the left great toe ulcer. She is a poor historian in regards to timeframe in detail. She states approximately 4 weeks ago she lacerated her toe on something in the house. She followed up with her primary care who placed her on Bactrim and ultimately a second dose of Bactrim prior to coming to wound clinic. She states she has been treating the toe with peroxide, Betadine and a Band-Aid. She did not check her blood sugar this Grinnell, Veroncia A ([id number]) [phone].pdf Page 13 of 22 morning but checked it yesterday morning it was 327; she is unaware of a recent A1c and there are no current records. She saw Dr. she would've orthopedics last week for an old injury to the left ankle, she states he did not see her toe, nor did she bring it to his attention. She smokes approximately 1 pack cigarettes a day. Her social situation is concerning, she arrives this morning with her mother who appears extremely intoxicated/under the influence; her mother was asked to leave the room and be monitored by the patient's grandmother. The patient's aunt then accompanied the patient and the room throughout the rest of the appointment. We had a lengthy discussion regarding the deleterious effects of uncontrolled hyperglycemia and smoking as it relates to wound healing and overall health. She was strongly encouraged to decrease her smoking and get her diabetes under better control. She states she is currently on a diet and has cut down her [facility] consumption. The left toe is erythematous, macerated and slightly edematous with malodor present. The edema in her left foot is below her baseline, there is no erythema streaking. We will treat her with Santyl, doxycycline; we have ordered and xray,  culture and provided a Peg assist surgical shoe and cultured the wound. 01/25/18-She is here in follow-up evaluation for a left great toe ulcer and presents with an abscess to her suprapubic area. She states her blood sugars remain elevated, feeling "sick" and if levels are below 250, but she is trying. She has made no attempt to decrease her smoking stating that we "can't take away her food in her cigarettes". She has been compliant with offloading using the PEG assist you. She is using Santyl daily. the culture obtained last week grew staph aureus and Enterococcus faecalis; continues on the doxycycline and Augmentin was added on Monday. The suprapubic area has erythema, no femoral variation, purple discoloration, minimal induration, was accessed with a cotton tip applicator with sanguinopurulent drainage, this was cultured, I suspect the current antibiotic treatment will cover and we will not add anything to her current treatment plan. She was advised to go to urgent care or ER with any change in redness, induration or fever. 02/01/18-She is here in follow-up evaluation for left great toe ulcers and a new abdominal abscess from last week. She  decisions and demonstrates good insight into disease process. Alert and Oriented x 3. pleasant and cooperative. Notes Upon inspection I did perform debridement of the left heel clearway callus slough and subcutaneous tissue down to healthy tissue and she tolerated that today without complication overall I am extremely pleased with where we stand in that regard. With regard to the right heel actually did remove some of the callus and performed that callus paring today in order to help alleviate some of the pressure on the heel and ensure this did not crack open and worsen yet again. She tolerated that without complication. Electronic Signature(s) Signed: 08/01/2023 9:57:38 AM By: [name] PA-C Entered By: [name] on 08/01/2023 09:57:38 -------------------------------------------------------------------------------- Physician Orders Details Patient Name: Date of Service: [name] 08/01/2023 9:00 A M Medical Record Number: [id number] Patient Account Number: [id number] Date of Birth/Sex: Treating RN: 27-Jun-1976 (47 y.o. 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 Bathing/ Shower/ Hygiene May shower; gently cleanse wound with antibacterial soap, rinse and pat dry prior to dressing wounds No tub bath. Off-Loading Total Contact Cast to Left Lower Extremity Open toe surgical shoe - right foot Wound Treatment Wound #12R - Foot Wound Laterality: Plantar, Left, Medial Cleanser: Vashe 5.8 (oz) Discharge Instructions: Use vashe 5.8 (oz) as directed Topical: Gentamicin Discharge Instructions: Apply as directed by provider. Prim Dressing: Hydrofera Blue Ready Transfer Foam, 2.5x2.5 (in/in) ary Discharge Instructions: Apply Hydrofera Blue Ready to wound bed as directed Secondary Dressing: ABD Pad 5x9 (in/in) Discharge Instructions: Cover with ABD pad Electronic Signature(s) Signed: 08/01/2023 11:40:05 AM By: [name] PA-C Signed: 08/01/2023 1:41:37 PM By: [name] Entered By: [name] on 08/01/2023 10:10:21 -------------------------------------------------------------------------------- Problem List Details Patient Name: Date of Service: [name] 08/01/2023 9:00 A M Medical Record Number: [id number] Patient Account Number: [id number] Date of Birth/Sex: Treating RN: 1976/01/09 (47 y.o. 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 03/16/2022 No Yes severity E11.42 Type 2 diabetes mellitus with diabetic polyneuropathy 03/16/2022 No Yes Z79.4 Long term (current) use of insulin 02/22/2023 No Yes Esham, Emmerson A ([id number]) [id number].pdf Page 12 of 22 N18.6 End stage renal disease 07/14/2023 No Yes Z99.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 Alert and Oriented x 3. pleasant and cooperative. Notes Upon inspection I did perform debridement of the left heel clearway callus slough and subcutaneous tissue down to healthy tissue and she tolerated that today without complication overall I am extremely pleased with where we stand in that regard. With regard to the right heel actually did remove some of the callus and performed that callus paring today in order to help alleviate some of the pressure on the heel and ensure this did not crack open and worsen yet again. She tolerated that without complication. Electronic Signature(s) Signed: 08/01/2023 9:57:38 AM By: [name] PA-C Entered By: [name] on 08/01/2023 09:57:38 -------------------------------------------------------------------------------- Physician Orders Details Patient Name: Date of Service: [name] 08/01/2023 9:00 A M Medical Record Number: [id number] Patient Account Number: [id number] Date of Birth/Sex: Treating RN: 27-Jun-1976 (47 y.o. 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 Bathing/ Shower/ Hygiene May shower; gently cleanse wound with antibacterial soap, rinse and pat dry prior to dressing wounds No tub bath. Off-Loading Total Contact Cast to Left Lower Extremity Open toe surgical shoe - right foot Wound Treatment Wound #12R - Foot Wound Laterality: Plantar, Left, Medial Cleanser: Vashe 5.8 (oz) Discharge Instructions: Use vashe 5.8 (oz) as directed Topical: Gentamicin Discharge Instructions: Apply as directed by provider. Prim Dressing: Hydrofera Blue Ready Transfer Foam, 2.5x2.5 (in/in) ary Discharge Instructions: Apply Hydrofera Blue Ready to wound bed as directed Secondary Dressing: ABD Pad 5x9 (in/in) Discharge Instructions: Cover with ABD pad Electronic Signature(s) Signed: 08/01/2023 11:40:05 AM By: [name] PA-C Signed: 08/01/2023 1:41:37 PM By: [name] Entered By: [name] on 08/01/2023 10:10:21 -------------------------------------------------------------------------------- Problem List Details Patient Name: Date of Service: [name] 08/01/2023 9:00 A M Medical Record Number: [id number] Patient Account Number: [id number] Date of Birth/Sex: Treating RN: 1976/01/09 (47 y.o. 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 03/16/2022 No Yes severity E11.42 Type 2 diabetes mellitus with diabetic polyneuropathy 03/16/2022 No Yes Z79.4 Long term (current) use of insulin 02/22/2023 No Yes Esham, Emmerson A ([id number]) [id number].pdf Page 12 of 22 N18.6 End stage renal disease 07/14/2023 No Yes Z99.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 Alert and Oriented x 3. pleasant and cooperative. Notes Upon inspection I did perform debridement of the left heel clearway callus slough and subcutaneous tissue down to healthy tissue and she tolerated that today without complication overall I am extremely pleased with where we stand in that regard. With regard to the right heel actually did remove some of the callus and performed that callus paring today in order to help alleviate some of the pressure on the heel and ensure this did not crack open and worsen yet again. She tolerated that without complication. Electronic Signature(s) Signed: 08/01/2023 9:57:38 AM By: [name] PA-C Entered By: [name] on 08/01/2023 09:57:38 -------------------------------------------------------------------------------- Physician Orders Details Patient Name: Date of Service: [name] 08/01/2023 9:00 A M Medical Record Number: [id number] Patient Account Number: [id number] Date of Birth/Sex: Treating RN: 27-Jun-1976 (47 y.o. 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 Bathing/ Shower/ Hygiene May shower; gently cleanse wound with antibacterial soap, rinse and pat dry prior to dressing wounds No tub bath. Off-Loading Total Contact Cast to Left Lower Extremity Open toe surgical shoe - right foot Wound Treatment Wound #12R - Foot Wound Laterality: Plantar, Left, Medial Cleanser: Vashe 5.8 (oz) Discharge Instructions: Use vashe 5.8 (oz) as directed Topical: Gentamicin Discharge Instructions: Apply as directed by provider. Prim Dressing: Hydrofera Blue Ready Transfer Foam, 2.5x2.5 (in/in) ary Discharge Instructions: Apply Hydrofera Blue Ready to wound bed as directed Secondary Dressing: ABD Pad 5x9 (in/in) Discharge Instructions: Cover with ABD pad Electronic Signature(s) Signed: 08/01/2023 11:40:05 AM By: [name] PA-C Signed: 08/01/2023 1:41:37 PM By: [name] Entered By: [name] on 08/01/2023 10:10:21 -------------------------------------------------------------------------------- Problem List Details Patient Name: Date of Service: [name] 08/01/2023 9:00 A M Medical Record Number: [id number] Patient Account Number: [id number] Date of Birth/Sex: Treating RN: 1976/01/09 (47 y.o. 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 03/16/2022 No Yes severity E11.42 Type 2 diabetes mellitus with diabetic polyneuropathy 03/16/2022 No Yes Z79.4 Long term (current) use of insulin 02/22/2023 No Yes Esham, Emmerson A ([id number]) [id number].pdf Page 12 of 22 N18.6 End stage renal disease 07/14/2023 No Yes Z99.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 07/14/2023 No Yes L84 Corns and callosities 08/01/2023 No Yes Inactive Problems Resolved Problems ICD-10 Code Description Active Date Resolved Date L97.512 Non-pressure chronic ulcer of other part of right foot with fat layer exposed 03/16/2022 03/16/2022 L97.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 of skin 03/16/2022 03/16/2022 W54.0XXA Bitten by dog, initial encounter 05/17/2023 05/17/2023 S81.801A Unspecified open wound, right lower leg, initial encounter 05/17/2023 05/17/2023 G25.427  to Left Lower Extremity otal Open toe surgical shoe - right foot WOUND #12R: - Foot Wound Laterality: Plantar, Left, Medial Cleanser: Vashe 5.8 (oz) Discharge Instructions: Use vashe 5.8 (oz) as directed Topical: Gentamicin Discharge Instructions: Apply as directed by provider. Prim Dressing: Hydrofera Blue Ready Transfer Foam, 2.5x2.5 (in/in) ary Discharge Instructions: Apply Hydrofera Blue Ready to wound bed as directed Secondary Dressing: ABD Pad 5x9 (in/in) Discharge Instructions: Cover  with ABD pad 1. Based on what I am seeing I do believe that the patient is doing quite well with regard to her her right heel. I did perform some of the callus paring in order to clear away the thicker callus that does not crack open end up causing additional wound she is in agreement with this plan and post clearing away of that callus she seems to be doing much better I think she should continue with the urea cream. 2. I am also going to recommend that she continue with the [facility] Blue to the left heel and subsequently total contact casting along with the gentamicin. 3. Muscle can recommend she should continue to try to stay off her feet is much as possible she does that and it obviously shows she is doing a really good job here. We will see patient back for reevaluation in 1 week here in the clinic. If anything worsens or changes patient will contact our office for additional recommendations. Electronic Signature(s) Signed: 08/01/2023 10:00:18 AM By: [name] PA-C Previous Signature: 08/01/2023 9:58:30 AM Version By: [name] PA-C Entered By: [name] on 08/01/2023 10:00:17 Tobey Grim ([id number]) [id number] Page 21 of 22 -------------------------------------------------------------------------------- Total Contact Cast Details Patient Name: Date of Service: LOLETTA, HARPER 08/01/2023 9:00 A M Medical Record Number: [id number] Patient Account Number: [id number] Date of Birth/Sex: Treating RN: [birth year] (47 y.o. Ginette Pitman Primary Care Provider: [name] Other Clinician: [name] Referring Provider: Treating Provider/Extender: [name] Weeks in Treatment: 69 T Contact Cast Applied for Wound Assessment: otal Wound #12R Left,Medial,Plantar Foot Performed By: Physician [name], PA-C Post Procedure Diagnosis Same as Pre-procedure Notes Patient tolerated procedure well Electronic Signature(s) Signed:  08/01/2023 11:40:05 AM By: [name] PA-C Signed: 08/01/2023 1:41:37 PM By: [name] Entered By: [name] on 08/01/2023 10:10:01 -------------------------------------------------------------------------------- SuperBill Details Patient Name: Date of Service: [name] 08/01/2023 Medical Record Number: [id number] Patient Account Number: [id number] Date of Birth/Sex: Treating RN: 1976/08/10 (47 y.o. 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 Z79.4 Long term (current) use of insulin N18.6 End stage renal disease Z99.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 L84 Corns and callosities Facility Procedures : MARNIE, FAZZINO Code: [id number] RISTI A ([id number]) I Description: 11042 - DEB SUBQ TISSUE 20 SQ CM/< [phone] CD-10 Diagnosis Description 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i Modifier: [id number] ty Quantity: 1 17.pdf Page 22 of 22 : CPT4 Code: [id number] 11 I Description: 055 - PARE BENIGN LES; SGL CD-10 Diagnosis Description L84 Corns and callosities Modifier: 1 Quantity: Physician Procedures : CPT4 Code Description Modifier [id number] 11042 - WC PHYS SUBQ TISS 20 SQ CM ICD-10 Diagnosis Description L97.528 Non-pressure chronic ulcer of other part  to Left Lower Extremity otal Open toe surgical shoe - right foot WOUND #12R: - Foot Wound Laterality: Plantar, Left, Medial Cleanser: Vashe 5.8 (oz) Discharge Instructions: Use vashe 5.8 (oz) as directed Topical: Gentamicin Discharge Instructions: Apply as directed by provider. Prim Dressing: Hydrofera Blue Ready Transfer Foam, 2.5x2.5 (in/in) ary Discharge Instructions: Apply Hydrofera Blue Ready to wound bed as directed Secondary Dressing: ABD Pad 5x9 (in/in) Discharge Instructions: Cover  with ABD pad 1. Based on what I am seeing I do believe that the patient is doing quite well with regard to her her right heel. I did perform some of the callus paring in order to clear away the thicker callus that does not crack open end up causing additional wound she is in agreement with this plan and post clearing away of that callus she seems to be doing much better I think she should continue with the urea cream. 2. I am also going to recommend that she continue with the [facility] Blue to the left heel and subsequently total contact casting along with the gentamicin. 3. Muscle can recommend she should continue to try to stay off her feet is much as possible she does that and it obviously shows she is doing a really good job here. We will see patient back for reevaluation in 1 week here in the clinic. If anything worsens or changes patient will contact our office for additional recommendations. Electronic Signature(s) Signed: 08/01/2023 10:00:18 AM By: [name] PA-C Previous Signature: 08/01/2023 9:58:30 AM Version By: [name] PA-C Entered By: [name] on 08/01/2023 10:00:17 Tobey Grim ([id number]) [id number] Page 21 of 22 -------------------------------------------------------------------------------- Total Contact Cast Details Patient Name: Date of Service: LOLETTA, HARPER 08/01/2023 9:00 A M Medical Record Number: [id number] Patient Account Number: [id number] Date of Birth/Sex: Treating RN: [birth year] (47 y.o. Ginette Pitman Primary Care Provider: [name] Other Clinician: [name] Referring Provider: Treating Provider/Extender: [name] Weeks in Treatment: 69 T Contact Cast Applied for Wound Assessment: otal Wound #12R Left,Medial,Plantar Foot Performed By: Physician [name], PA-C Post Procedure Diagnosis Same as Pre-procedure Notes Patient tolerated procedure well Electronic Signature(s) Signed:  08/01/2023 11:40:05 AM By: [name] PA-C Signed: 08/01/2023 1:41:37 PM By: [name] Entered By: [name] on 08/01/2023 10:10:01 -------------------------------------------------------------------------------- SuperBill Details Patient Name: Date of Service: [name] 08/01/2023 Medical Record Number: [id number] Patient Account Number: [id number] Date of Birth/Sex: Treating RN: 1976/08/10 (47 y.o. 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 Z79.4 Long term (current) use of insulin N18.6 End stage renal disease Z99.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 L84 Corns and callosities Facility Procedures : MARNIE, FAZZINO Code: [id number] RISTI A ([id number]) I Description: 11042 - DEB SUBQ TISSUE 20 SQ CM/< [phone] CD-10 Diagnosis Description 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i Modifier: [id number] ty Quantity: 1 17.pdf Page 22 of 22 : CPT4 Code: [id number] 11 I Description: 055 - PARE BENIGN LES; SGL CD-10 Diagnosis Description L84 Corns and callosities Modifier: 1 Quantity: Physician Procedures : CPT4 Code Description Modifier [id number] 11042 - WC PHYS SUBQ TISS 20 SQ CM ICD-10 Diagnosis Description L97.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 Ginette Pitman Primary Care Provider: [name] Other Clinician: [name] Referring Provider: Treating Provider/Extender: [name] Weeks in Treatment: 44 Information Obtained from: Patient Chief Complaint Left foot ulcer Electronic Signature(s) Signed: 08/01/2023 10:00:37 AM By: [name] PA-C Previous Signature: 08/01/2023 8:49:31 AM Version By: [name] PA-C Entered By: [name] on 08/01/2023 10:00:37 -------------------------------------------------------------------------------- Debridement Details Patient Name: Date of Service: [name] 08/01/2023 9:00 A M Medical Record Number: [id number] Patient Account Number: [id number] Date of Birth/Sex: Treating RN: 1976/02/26 (47 y.o. Ginette Pitman Primary Care Provider: [name] Other Clinician: [name] Referring Provider: Treating Provider/Extender: [name] in Treatment: 71 Debridement Performed for Assessment: Wound #12R Left,Medial,Plantar Foot Performed By: Physician [name], PA-C Debridement Type: Debridement Severity of Tissue Pre Debridement: Fat layer exposed Level of Consciousness (Pre-procedure): Awake and Alert Pre-procedure Verification/Time Out Yes - 09:40 Taken: Start Time: 09:40 Percent of Wound Bed Debrided: 100% T Area Debrided (cm): otal 0.09 Tissue and other material debrided: Viable, Non-Viable, Callus, Slough, Subcutaneous, Slough Level: Skin/Subcutaneous Tissue Debridement Description: Excisional Instrument: Curette Bleeding: Minimum Hemostasis Achieved: Pressure Response to Treatment:  Procedure was tolerated well Level of Consciousness (Post- Awake and Alert MARIADEL, MRUK A ([id number]) [phone].pdf Page 2 of 22 Awake and Alert procedure): Post Debridement Measurements of Total Wound Length: (cm) 0.4 Width: (cm) 0.3 Depth: (cm) 0.1 Volume: (cm) 0.009 Character of Wound/Ulcer Post Debridement: Improved Severity of Tissue Post Debridement: Fat layer exposed Post Procedure Diagnosis Same as Pre-procedure Electronic Signature(s) Signed: 08/01/2023 11:40:05 AM By: [name] PA-C Signed: 08/01/2023 1:41:37 PM By: [name] Signed: 08/01/2023 5:02:18 PM By: Midge Aver MSN RN CNS WTA Entered By: [name] on 08/01/2023 09:42:43 -------------------------------------------------------------------------------- HPI Details Patient Name: Date of Service: [name], Emmelyn A. 08/01/2023 9:00 A M Medical Record Number: [id number] Patient Account Number: [id number] Date of Birth/Sex: Treating RN: 1975/12/12 (47 y.o. Ginette Pitman Primary Care Provider: [name] Other Clinician: [name] Referring Provider: Treating Provider/Extender: [name] in Treatment: 62 History of Present Illness HPI Description: 01/18/18-She is here for initial evaluation of the left great toe ulcer. She is a poor historian in regards to timeframe in detail. She states approximately 4 weeks ago she lacerated her toe on something in the house. She followed up with her primary care who placed her on Bactrim and ultimately a second dose of Bactrim prior to coming to wound clinic. She states she has been treating the toe with peroxide, Betadine and a Band-Aid. She did not check her blood sugar this morning but checked it yesterday morning it was 327; she is unaware of a recent A1c and there are no current records. She saw Dr. she would've orthopedics last week for an old injury to the left ankle, she states he did not see her toe, nor  did she bring it to his attention. She smokes approximately 1 pack cigarettes a day. Her social situation is concerning, she arrives this morning with her mother who appears extremely intoxicated/under the influence; her mother was asked to leave the room and be monitored by the patient's grandmother. The patient's aunt then accompanied the patient and the room throughout the rest of the appointment.  decisions and demonstrates good insight into disease process. Alert and Oriented x 3. pleasant and cooperative. Notes Upon inspection I did perform debridement of the left heel clearway callus slough and subcutaneous tissue down to healthy tissue and she tolerated that today without complication overall I am extremely pleased with where we stand in that regard. With regard to the right heel actually did remove some of the callus and performed that callus paring today in order to help alleviate some of the pressure on the heel and ensure this did not crack open and worsen yet again. She tolerated that without complication. Electronic Signature(s) Signed: 08/01/2023 9:57:38 AM By: [name] PA-C Entered By: [name] on 08/01/2023 09:57:38 -------------------------------------------------------------------------------- Physician Orders Details Patient Name: Date of Service: [name] 08/01/2023 9:00 A M Medical Record Number: [id number] Patient Account Number: [id number] Date of Birth/Sex: Treating RN: 27-Jun-1976 (47 y.o. 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 Bathing/ Shower/ Hygiene May shower; gently cleanse wound with antibacterial soap, rinse and pat dry prior to dressing wounds No tub bath. Off-Loading Total Contact Cast to Left Lower Extremity Open toe surgical shoe - right foot Wound Treatment Wound #12R - Foot Wound Laterality: Plantar, Left, Medial Cleanser: Vashe 5.8 (oz) Discharge Instructions: Use vashe 5.8 (oz) as directed Topical: Gentamicin Discharge Instructions: Apply as directed by provider. Prim Dressing: Hydrofera Blue Ready Transfer Foam, 2.5x2.5 (in/in) ary Discharge Instructions: Apply Hydrofera Blue Ready to wound bed as directed Secondary Dressing: ABD Pad 5x9 (in/in) Discharge Instructions: Cover with ABD pad Electronic Signature(s) Signed: 08/01/2023 11:40:05 AM By: [name] PA-C Signed: 08/01/2023 1:41:37 PM By: [name] Entered By: [name] on 08/01/2023 10:10:21 -------------------------------------------------------------------------------- Problem List Details Patient Name: Date of Service: [name] 08/01/2023 9:00 A M Medical Record Number: [id number] Patient Account Number: [id number] Date of Birth/Sex: Treating RN: 1976/01/09 (47 y.o. 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 03/16/2022 No Yes severity E11.42 Type 2 diabetes mellitus with diabetic polyneuropathy 03/16/2022 No Yes Z79.4 Long term (current) use of insulin 02/22/2023 No Yes Esham, Emmerson A ([id number]) [id number].pdf Page 12 of 22 N18.6 End stage renal disease 07/14/2023 No Yes Z99.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 07/14/2023 No Yes L84 Corns and callosities 08/01/2023 No Yes Inactive Problems Resolved Problems ICD-10 Code Description Active Date Resolved Date L97.512 Non-pressure chronic ulcer of other part of right foot with fat layer exposed 03/16/2022 03/16/2022 L97.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 of skin 03/16/2022 03/16/2022 W54.0XXA Bitten by dog, initial encounter 05/17/2023 05/17/2023 S81.801A Unspecified open wound, right lower leg, initial encounter 05/17/2023 05/17/2023 G25.427  Type 2 diabetes mellitus with other skin ulcer 05/17/2023 05/17/2023 Electronic Signature(s) Signed: 08/01/2023 9:57:30 AM By: [name] PA-C Previous Signature: 08/01/2023 8:49:27 AM Version By: [name] PA-C Entered By: [name] on 08/01/2023 09:57:30 -------------------------------------------------------------------------------- Progress Note Details Patient Name: Date of Service: [name] 08/01/2023 9:00 A M Medical Record Number: [id number] Patient Account Number: [id number] Date of Birth/Sex: Treating RN: 25-Feb-1976 (47 y.o. Ginette Pitman Primary Care Provider: [name] Other Clinician: [name] Referring Provider: Treating Provider/Extender: [name] in Treatment: 53 Subjective Chief Complaint Information obtained from Patient Left foot ulcer History of Present Illness (HPI) 01/18/18-She is here for initial evaluation of the left great toe ulcer. She is a poor historian in regards to timeframe in detail. She states approximately 4 weeks ago she lacerated her toe on something in the house. She followed up with her primary care who placed her on Bactrim and ultimately a second dose of Bactrim prior to coming to wound clinic. She states she has been treating the toe with peroxide, Betadine and a Band-Aid. She did not check her blood sugar this Grinnell, Veroncia A ([id number]) [phone].pdf Page 13 of 22 morning but checked it yesterday morning it was 327; she is unaware of a recent A1c and there are no current records. She saw Dr. she would've orthopedics last week for an old injury to the left ankle, she states he did not see her toe, nor did she bring it to his attention. She smokes approximately 1 pack cigarettes a day. Her social situation is concerning, she arrives this morning with her mother who appears extremely intoxicated/under the influence; her mother was asked to leave the room and be monitored by the patient's grandmother. The patient's aunt then accompanied the patient and the room throughout the rest of the appointment. We had a lengthy discussion regarding the deleterious effects of uncontrolled hyperglycemia and smoking as it relates to wound healing and overall health. She was strongly encouraged to decrease her smoking and get her diabetes under better control. She states she is currently on a diet and has cut down her [facility] consumption. The left toe is erythematous, macerated and slightly edematous with malodor present. The edema in her left foot is below her baseline, there is no erythema streaking. We will treat her with Santyl, doxycycline; we have ordered and xray,  culture and provided a Peg assist surgical shoe and cultured the wound. 01/25/18-She is here in follow-up evaluation for a left great toe ulcer and presents with an abscess to her suprapubic area. She states her blood sugars remain elevated, feeling "sick" and if levels are below 250, but she is trying. She has made no attempt to decrease her smoking stating that we "can't take away her food in her cigarettes". She has been compliant with offloading using the PEG assist you. She is using Santyl daily. the culture obtained last week grew staph aureus and Enterococcus faecalis; continues on the doxycycline and Augmentin was added on Monday. The suprapubic area has erythema, no femoral variation, purple discoloration, minimal induration, was accessed with a cotton tip applicator with sanguinopurulent drainage, this was cultured, I suspect the current antibiotic treatment will cover and we will not add anything to her current treatment plan. She was advised to go to urgent care or ER with any change in redness, induration or fever. 02/01/18-She is here in follow-up evaluation for left great toe ulcers and a new abdominal abscess from last week. She  ALLISHA, HARTER A ([id number]) [id number] Page 1 of 22 Visit Report for 08/01/2023 Chief Complaint Document Details Patient Name: Date of Service: TORRENCE, BRANAGAN 08/01/2023 9:00 A M Medical Record Number: [id number] Patient Account Number: [id number] Date of Birth/Sex: Treating RN: 03-22-1976 (47 y.o. Ginette Pitman Primary Care Provider: [name] Other Clinician: [name] Referring Provider: Treating Provider/Extender: [name] Weeks in Treatment: 44 Information Obtained from: Patient Chief Complaint Left foot ulcer Electronic Signature(s) Signed: 08/01/2023 10:00:37 AM By: [name] PA-C Previous Signature: 08/01/2023 8:49:31 AM Version By: [name] PA-C Entered By: [name] on 08/01/2023 10:00:37 -------------------------------------------------------------------------------- Debridement Details Patient Name: Date of Service: [name] 08/01/2023 9:00 A M Medical Record Number: [id number] Patient Account Number: [id number] Date of Birth/Sex: Treating RN: 1976/02/26 (47 y.o. Ginette Pitman Primary Care Provider: [name] Other Clinician: [name] Referring Provider: Treating Provider/Extender: [name] in Treatment: 71 Debridement Performed for Assessment: Wound #12R Left,Medial,Plantar Foot Performed By: Physician [name], PA-C Debridement Type: Debridement Severity of Tissue Pre Debridement: Fat layer exposed Level of Consciousness (Pre-procedure): Awake and Alert Pre-procedure Verification/Time Out Yes - 09:40 Taken: Start Time: 09:40 Percent of Wound Bed Debrided: 100% T Area Debrided (cm): otal 0.09 Tissue and other material debrided: Viable, Non-Viable, Callus, Slough, Subcutaneous, Slough Level: Skin/Subcutaneous Tissue Debridement Description: Excisional Instrument: Curette Bleeding: Minimum Hemostasis Achieved: Pressure Response to Treatment:  Procedure was tolerated well Level of Consciousness (Post- Awake and Alert MARIADEL, MRUK A ([id number]) [phone].pdf Page 2 of 22 Awake and Alert procedure): Post Debridement Measurements of Total Wound Length: (cm) 0.4 Width: (cm) 0.3 Depth: (cm) 0.1 Volume: (cm) 0.009 Character of Wound/Ulcer Post Debridement: Improved Severity of Tissue Post Debridement: Fat layer exposed Post Procedure Diagnosis Same as Pre-procedure Electronic Signature(s) Signed: 08/01/2023 11:40:05 AM By: [name] PA-C Signed: 08/01/2023 1:41:37 PM By: [name] Signed: 08/01/2023 5:02:18 PM By: Midge Aver MSN RN CNS WTA Entered By: [name] on 08/01/2023 09:42:43 -------------------------------------------------------------------------------- HPI Details Patient Name: Date of Service: [name], Emmelyn A. 08/01/2023 9:00 A M Medical Record Number: [id number] Patient Account Number: [id number] Date of Birth/Sex: Treating RN: 1975/12/12 (47 y.o. Ginette Pitman Primary Care Provider: [name] Other Clinician: [name] Referring Provider: Treating Provider/Extender: [name] in Treatment: 62 History of Present Illness HPI Description: 01/18/18-She is here for initial evaluation of the left great toe ulcer. She is a poor historian in regards to timeframe in detail. She states approximately 4 weeks ago she lacerated her toe on something in the house. She followed up with her primary care who placed her on Bactrim and ultimately a second dose of Bactrim prior to coming to wound clinic. She states she has been treating the toe with peroxide, Betadine and a Band-Aid. She did not check her blood sugar this morning but checked it yesterday morning it was 327; she is unaware of a recent A1c and there are no current records. She saw Dr. she would've orthopedics last week for an old injury to the left ankle, she states he did not see her toe, nor  did she bring it to his attention. She smokes approximately 1 pack cigarettes a day. Her social situation is concerning, she arrives this morning with her mother who appears extremely intoxicated/under the influence; her mother was asked to leave the room and be monitored by the patient's grandmother. The patient's aunt then accompanied the patient and the room throughout the rest of the appointment.  ALLISHA, HARTER A ([id number]) [id number] Page 1 of 22 Visit Report for 08/01/2023 Chief Complaint Document Details Patient Name: Date of Service: TORRENCE, BRANAGAN 08/01/2023 9:00 A M Medical Record Number: [id number] Patient Account Number: [id number] Date of Birth/Sex: Treating RN: 03-22-1976 (47 y.o. Ginette Pitman Primary Care Provider: [name] Other Clinician: [name] Referring Provider: Treating Provider/Extender: [name] Weeks in Treatment: 44 Information Obtained from: Patient Chief Complaint Left foot ulcer Electronic Signature(s) Signed: 08/01/2023 10:00:37 AM By: [name] PA-C Previous Signature: 08/01/2023 8:49:31 AM Version By: [name] PA-C Entered By: [name] on 08/01/2023 10:00:37 -------------------------------------------------------------------------------- Debridement Details Patient Name: Date of Service: [name] 08/01/2023 9:00 A M Medical Record Number: [id number] Patient Account Number: [id number] Date of Birth/Sex: Treating RN: 1976/02/26 (47 y.o. Ginette Pitman Primary Care Provider: [name] Other Clinician: [name] Referring Provider: Treating Provider/Extender: [name] in Treatment: 71 Debridement Performed for Assessment: Wound #12R Left,Medial,Plantar Foot Performed By: Physician [name], PA-C Debridement Type: Debridement Severity of Tissue Pre Debridement: Fat layer exposed Level of Consciousness (Pre-procedure): Awake and Alert Pre-procedure Verification/Time Out Yes - 09:40 Taken: Start Time: 09:40 Percent of Wound Bed Debrided: 100% T Area Debrided (cm): otal 0.09 Tissue and other material debrided: Viable, Non-Viable, Callus, Slough, Subcutaneous, Slough Level: Skin/Subcutaneous Tissue Debridement Description: Excisional Instrument: Curette Bleeding: Minimum Hemostasis Achieved: Pressure Response to Treatment:  Procedure was tolerated well Level of Consciousness (Post- Awake and Alert MARIADEL, MRUK A ([id number]) [phone].pdf Page 2 of 22 Awake and Alert procedure): Post Debridement Measurements of Total Wound Length: (cm) 0.4 Width: (cm) 0.3 Depth: (cm) 0.1 Volume: (cm) 0.009 Character of Wound/Ulcer Post Debridement: Improved Severity of Tissue Post Debridement: Fat layer exposed Post Procedure Diagnosis Same as Pre-procedure Electronic Signature(s) Signed: 08/01/2023 11:40:05 AM By: [name] PA-C Signed: 08/01/2023 1:41:37 PM By: [name] Signed: 08/01/2023 5:02:18 PM By: Midge Aver MSN RN CNS WTA Entered By: [name] on 08/01/2023 09:42:43 -------------------------------------------------------------------------------- HPI Details Patient Name: Date of Service: [name], Emmelyn A. 08/01/2023 9:00 A M Medical Record Number: [id number] Patient Account Number: [id number] Date of Birth/Sex: Treating RN: 1975/12/12 (47 y.o. Ginette Pitman Primary Care Provider: [name] Other Clinician: [name] Referring Provider: Treating Provider/Extender: [name] in Treatment: 62 History of Present Illness HPI Description: 01/18/18-She is here for initial evaluation of the left great toe ulcer. She is a poor historian in regards to timeframe in detail. She states approximately 4 weeks ago she lacerated her toe on something in the house. She followed up with her primary care who placed her on Bactrim and ultimately a second dose of Bactrim prior to coming to wound clinic. She states she has been treating the toe with peroxide, Betadine and a Band-Aid. She did not check her blood sugar this morning but checked it yesterday morning it was 327; she is unaware of a recent A1c and there are no current records. She saw Dr. she would've orthopedics last week for an old injury to the left ankle, she states he did not see her toe, nor  did she bring it to his attention. She smokes approximately 1 pack cigarettes a day. Her social situation is concerning, she arrives this morning with her mother who appears extremely intoxicated/under the influence; her mother was asked to leave the room and be monitored by the patient's grandmother. The patient's aunt then accompanied the patient and the room throughout the rest of the appointment.  to Left Lower Extremity otal Open toe surgical shoe - right foot WOUND #12R: - Foot Wound Laterality: Plantar, Left, Medial Cleanser: Vashe 5.8 (oz) Discharge Instructions: Use vashe 5.8 (oz) as directed Topical: Gentamicin Discharge Instructions: Apply as directed by provider. Prim Dressing: Hydrofera Blue Ready Transfer Foam, 2.5x2.5 (in/in) ary Discharge Instructions: Apply Hydrofera Blue Ready to wound bed as directed Secondary Dressing: ABD Pad 5x9 (in/in) Discharge Instructions: Cover  with ABD pad 1. Based on what I am seeing I do believe that the patient is doing quite well with regard to her her right heel. I did perform some of the callus paring in order to clear away the thicker callus that does not crack open end up causing additional wound she is in agreement with this plan and post clearing away of that callus she seems to be doing much better I think she should continue with the urea cream. 2. I am also going to recommend that she continue with the [facility] Blue to the left heel and subsequently total contact casting along with the gentamicin. 3. Muscle can recommend she should continue to try to stay off her feet is much as possible she does that and it obviously shows she is doing a really good job here. We will see patient back for reevaluation in 1 week here in the clinic. If anything worsens or changes patient will contact our office for additional recommendations. Electronic Signature(s) Signed: 08/01/2023 10:00:18 AM By: [name] PA-C Previous Signature: 08/01/2023 9:58:30 AM Version By: [name] PA-C Entered By: [name] on 08/01/2023 10:00:17 Tobey Grim ([id number]) [id number] Page 21 of 22 -------------------------------------------------------------------------------- Total Contact Cast Details Patient Name: Date of Service: LOLETTA, HARPER 08/01/2023 9:00 A M Medical Record Number: [id number] Patient Account Number: [id number] Date of Birth/Sex: Treating RN: [birth year] (47 y.o. Ginette Pitman Primary Care Provider: [name] Other Clinician: [name] Referring Provider: Treating Provider/Extender: [name] Weeks in Treatment: 69 T Contact Cast Applied for Wound Assessment: otal Wound #12R Left,Medial,Plantar Foot Performed By: Physician [name], PA-C Post Procedure Diagnosis Same as Pre-procedure Notes Patient tolerated procedure well Electronic Signature(s) Signed:  08/01/2023 11:40:05 AM By: [name] PA-C Signed: 08/01/2023 1:41:37 PM By: [name] Entered By: [name] on 08/01/2023 10:10:01 -------------------------------------------------------------------------------- SuperBill Details Patient Name: Date of Service: [name] 08/01/2023 Medical Record Number: [id number] Patient Account Number: [id number] Date of Birth/Sex: Treating RN: 1976/08/10 (47 y.o. 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 Z79.4 Long term (current) use of insulin N18.6 End stage renal disease Z99.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 L84 Corns and callosities Facility Procedures : MARNIE, FAZZINO Code: [id number] RISTI A ([id number]) I Description: 11042 - DEB SUBQ TISSUE 20 SQ CM/< [phone] CD-10 Diagnosis Description 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i Modifier: [id number] ty Quantity: 1 17.pdf Page 22 of 22 : CPT4 Code: 01601093 11 I Description: 055 - PARE BENIGN LES; SGL CD-10 Diagnosis Description L84 Corns and callosities Modifier: 1 Quantity: Physician Procedures : CPT4 Code Description Modifier [id number] 11042 - WC PHYS SUBQ TISS 20 SQ CM ICD-10 Diagnosis Description L97.528 Non-pressure chronic ulcer of other part  Type 2 diabetes mellitus with other skin ulcer 05/17/2023 05/17/2023 Electronic Signature(s) Signed: 08/01/2023 9:57:30 AM By: [name] PA-C Previous Signature: 08/01/2023 8:49:27 AM Version By: [name] PA-C Entered By: [name] on 08/01/2023 09:57:30 -------------------------------------------------------------------------------- Progress Note Details Patient Name: Date of Service: [name] 08/01/2023 9:00 A M Medical Record Number: [id number] Patient Account Number: [id number] Date of Birth/Sex: Treating RN: 25-Feb-1976 (47 y.o. Ginette Pitman Primary Care Provider: [name] Other Clinician: [name] Referring Provider: Treating Provider/Extender: [name] in Treatment: 53 Subjective Chief Complaint Information obtained from Patient Left foot ulcer History of Present Illness (HPI) 01/18/18-She is here for initial evaluation of the left great toe ulcer. She is a poor historian in regards to timeframe in detail. She states approximately 4 weeks ago she lacerated her toe on something in the house. She followed up with her primary care who placed her on Bactrim and ultimately a second dose of Bactrim prior to coming to wound clinic. She states she has been treating the toe with peroxide, Betadine and a Band-Aid. She did not check her blood sugar this Grinnell, Veroncia A ([id number]) [phone].pdf Page 13 of 22 morning but checked it yesterday morning it was 327; she is unaware of a recent A1c and there are no current records. She saw Dr. she would've orthopedics last week for an old injury to the left ankle, she states he did not see her toe, nor did she bring it to his attention. She smokes approximately 1 pack cigarettes a day. Her social situation is concerning, she arrives this morning with her mother who appears extremely intoxicated/under the influence; her mother was asked to leave the room and be monitored by the patient's grandmother. The patient's aunt then accompanied the patient and the room throughout the rest of the appointment. We had a lengthy discussion regarding the deleterious effects of uncontrolled hyperglycemia and smoking as it relates to wound healing and overall health. She was strongly encouraged to decrease her smoking and get her diabetes under better control. She states she is currently on a diet and has cut down her [facility] consumption. The left toe is erythematous, macerated and slightly edematous with malodor present. The edema in her left foot is below her baseline, there is no erythema streaking. We will treat her with Santyl, doxycycline; we have ordered and xray,  culture and provided a Peg assist surgical shoe and cultured the wound. 01/25/18-She is here in follow-up evaluation for a left great toe ulcer and presents with an abscess to her suprapubic area. She states her blood sugars remain elevated, feeling "sick" and if levels are below 250, but she is trying. She has made no attempt to decrease her smoking stating that we "can't take away her food in her cigarettes". She has been compliant with offloading using the PEG assist you. She is using Santyl daily. the culture obtained last week grew staph aureus and Enterococcus faecalis; continues on the doxycycline and Augmentin was added on Monday. The suprapubic area has erythema, no femoral variation, purple discoloration, minimal induration, was accessed with a cotton tip applicator with sanguinopurulent drainage, this was cultured, I suspect the current antibiotic treatment will cover and we will not add anything to her current treatment plan. She was advised to go to urgent care or ER with any change in redness, induration or fever. 02/01/18-She is here in follow-up evaluation for left great toe ulcers and a new abdominal abscess from last week. She  decisions and demonstrates good insight into disease process. Alert and Oriented x 3. pleasant and cooperative. Notes Upon inspection I did perform debridement of the left heel clearway callus slough and subcutaneous tissue down to healthy tissue and she tolerated that today without complication overall I am extremely pleased with where we stand in that regard. With regard to the right heel actually did remove some of the callus and performed that callus paring today in order to help alleviate some of the pressure on the heel and ensure this did not crack open and worsen yet again. She tolerated that without complication. Electronic Signature(s) Signed: 08/01/2023 9:57:38 AM By: [name] PA-C Entered By: [name] on 08/01/2023 09:57:38 -------------------------------------------------------------------------------- Physician Orders Details Patient Name: Date of Service: [name] 08/01/2023 9:00 A M Medical Record Number: [id number] Patient Account Number: [id number] Date of Birth/Sex: Treating RN: 27-Jun-1976 (47 y.o. 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 Bathing/ Shower/ Hygiene May shower; gently cleanse wound with antibacterial soap, rinse and pat dry prior to dressing wounds No tub bath. Off-Loading Total Contact Cast to Left Lower Extremity Open toe surgical shoe - right foot Wound Treatment Wound #12R - Foot Wound Laterality: Plantar, Left, Medial Cleanser: Vashe 5.8 (oz) Discharge Instructions: Use vashe 5.8 (oz) as directed Topical: Gentamicin Discharge Instructions: Apply as directed by provider. Prim Dressing: Hydrofera Blue Ready Transfer Foam, 2.5x2.5 (in/in) ary Discharge Instructions: Apply Hydrofera Blue Ready to wound bed as directed Secondary Dressing: ABD Pad 5x9 (in/in) Discharge Instructions: Cover with ABD pad Electronic Signature(s) Signed: 08/01/2023 11:40:05 AM By: [name] PA-C Signed: 08/01/2023 1:41:37 PM By: [name] Entered By: [name] on 08/01/2023 10:10:21 -------------------------------------------------------------------------------- Problem List Details Patient Name: Date of Service: [name] 08/01/2023 9:00 A M Medical Record Number: [id number] Patient Account Number: [id number] Date of Birth/Sex: Treating RN: 1976/01/09 (47 y.o. 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 03/16/2022 No Yes severity E11.42 Type 2 diabetes mellitus with diabetic polyneuropathy 03/16/2022 No Yes Z79.4 Long term (current) use of insulin 02/22/2023 No Yes Esham, Emmerson A ([id number]) [id number].pdf Page 12 of 22 N18.6 End stage renal disease 07/14/2023 No Yes Z99.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 Alert and Oriented x 3. pleasant and cooperative. Notes Upon inspection I did perform debridement of the left heel clearway callus slough and subcutaneous tissue down to healthy tissue and she tolerated that today without complication overall I am extremely pleased with where we stand in that regard. With regard to the right heel actually did remove some of the callus and performed that callus paring today in order to help alleviate some of the pressure on the heel and ensure this did not crack open and worsen yet again. She tolerated that without complication. Electronic Signature(s) Signed: 08/01/2023 9:57:38 AM By: [name] PA-C Entered By: [name] on 08/01/2023 09:57:38 -------------------------------------------------------------------------------- Physician Orders Details Patient Name: Date of Service: [name] 08/01/2023 9:00 A M Medical Record Number: [id number] Patient Account Number: [id number] Date of Birth/Sex: Treating RN: 27-Jun-1976 (47 y.o. 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 Bathing/ Shower/ Hygiene May shower; gently cleanse wound with antibacterial soap, rinse and pat dry prior to dressing wounds No tub bath. Off-Loading Total Contact Cast to Left Lower Extremity Open toe surgical shoe - right foot Wound Treatment Wound #12R - Foot Wound Laterality: Plantar, Left, Medial Cleanser: Vashe 5.8 (oz) Discharge Instructions: Use vashe 5.8 (oz) as directed Topical: Gentamicin Discharge Instructions: Apply as directed by provider. Prim Dressing: Hydrofera Blue Ready Transfer Foam, 2.5x2.5 (in/in) ary Discharge Instructions: Apply Hydrofera Blue Ready to wound bed as directed Secondary Dressing: ABD Pad 5x9 (in/in) Discharge Instructions: Cover with ABD pad Electronic Signature(s) Signed: 08/01/2023 11:40:05 AM By: [name] PA-C Signed: 08/01/2023 1:41:37 PM By: [name] Entered By: [name] on 08/01/2023 10:10:21 -------------------------------------------------------------------------------- Problem List Details Patient Name: Date of Service: [name] 08/01/2023 9:00 A M Medical Record Number: [id number] Patient Account Number: [id number] Date of Birth/Sex: Treating RN: 1976/01/09 (47 y.o. 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 03/16/2022 No Yes severity E11.42 Type 2 diabetes mellitus with diabetic polyneuropathy 03/16/2022 No Yes Z79.4 Long term (current) use of insulin 02/22/2023 No Yes Esham, Emmerson A ([id number]) [id number].pdf Page 12 of 22 N18.6 End stage renal disease 07/14/2023 No Yes Z99.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 07/14/2023 No Yes L84 Corns and callosities 08/01/2023 No Yes Inactive Problems Resolved Problems ICD-10 Code Description Active Date Resolved Date L97.512 Non-pressure chronic ulcer of other part of right foot with fat layer exposed 03/16/2022 03/16/2022 L97.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 of skin 03/16/2022 03/16/2022 W54.0XXA Bitten by dog, initial encounter 05/17/2023 05/17/2023 S81.801A Unspecified open wound, right lower leg, initial encounter 05/17/2023 05/17/2023 G25.427  to Left Lower Extremity otal Open toe surgical shoe - right foot WOUND #12R: - Foot Wound Laterality: Plantar, Left, Medial Cleanser: Vashe 5.8 (oz) Discharge Instructions: Use vashe 5.8 (oz) as directed Topical: Gentamicin Discharge Instructions: Apply as directed by provider. Prim Dressing: Hydrofera Blue Ready Transfer Foam, 2.5x2.5 (in/in) ary Discharge Instructions: Apply Hydrofera Blue Ready to wound bed as directed Secondary Dressing: ABD Pad 5x9 (in/in) Discharge Instructions: Cover  with ABD pad 1. Based on what I am seeing I do believe that the patient is doing quite well with regard to her her right heel. I did perform some of the callus paring in order to clear away the thicker callus that does not crack open end up causing additional wound she is in agreement with this plan and post clearing away of that callus she seems to be doing much better I think she should continue with the urea cream. 2. I am also going to recommend that she continue with the [facility] Blue to the left heel and subsequently total contact casting along with the gentamicin. 3. Muscle can recommend she should continue to try to stay off her feet is much as possible she does that and it obviously shows she is doing a really good job here. We will see patient back for reevaluation in 1 week here in the clinic. If anything worsens or changes patient will contact our office for additional recommendations. Electronic Signature(s) Signed: 08/01/2023 10:00:18 AM By: [name] PA-C Previous Signature: 08/01/2023 9:58:30 AM Version By: [name] PA-C Entered By: [name] on 08/01/2023 10:00:17 Tobey Grim ([id number]) [id number] Page 21 of 22 -------------------------------------------------------------------------------- Total Contact Cast Details Patient Name: Date of Service: LOLETTA, HARPER 08/01/2023 9:00 A M Medical Record Number: [id number] Patient Account Number: [id number] Date of Birth/Sex: Treating RN: [birth year] (47 y.o. Ginette Pitman Primary Care Provider: [name] Other Clinician: [name] Referring Provider: Treating Provider/Extender: [name] Weeks in Treatment: 69 T Contact Cast Applied for Wound Assessment: otal Wound #12R Left,Medial,Plantar Foot Performed By: Physician [name], PA-C Post Procedure Diagnosis Same as Pre-procedure Notes Patient tolerated procedure well Electronic Signature(s) Signed:  08/01/2023 11:40:05 AM By: [name] PA-C Signed: 08/01/2023 1:41:37 PM By: [name] Entered By: [name] on 08/01/2023 10:10:01 -------------------------------------------------------------------------------- SuperBill Details Patient Name: Date of Service: [name] 08/01/2023 Medical Record Number: [id number] Patient Account Number: [id number] Date of Birth/Sex: Treating RN: 1976/08/10 (47 y.o. 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 Z79.4 Long term (current) use of insulin N18.6 End stage renal disease Z99.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 L84 Corns and callosities Facility Procedures : MARNIE, FAZZINO Code: [id number] RISTI A ([id number]) I Description: 11042 - DEB SUBQ TISSUE 20 SQ CM/< [phone] CD-10 Diagnosis Description 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 Alert and Oriented x 3. pleasant and cooperative. Notes Upon inspection I did perform debridement of the left heel clearway callus slough and subcutaneous tissue down to healthy tissue and she tolerated that today without complication overall I am extremely pleased with where we stand in that regard. With regard to the right heel actually did remove some of the callus and performed that callus paring today in order to help alleviate some of the pressure on the heel and ensure this did not crack open and worsen yet again. She tolerated that without complication. Electronic Signature(s) Signed: 08/01/2023 9:57:38 AM By: [name] PA-C Entered By: [name] on 08/01/2023 09:57:38 -------------------------------------------------------------------------------- Physician Orders Details Patient Name: Date of Service: [name] 08/01/2023 9:00 A M Medical Record Number: [id number] Patient Account Number: [id number] Date of Birth/Sex: Treating RN: 27-Jun-1976 (47 y.o. 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 Bathing/ Shower/ Hygiene May shower; gently cleanse wound with antibacterial soap, rinse and pat dry prior to dressing wounds No tub bath. Off-Loading Total Contact Cast to Left Lower Extremity Open toe surgical shoe - right foot Wound Treatment Wound #12R - Foot Wound Laterality: Plantar, Left, Medial Cleanser: Vashe 5.8 (oz) Discharge Instructions: Use vashe 5.8 (oz) as directed Topical: Gentamicin Discharge Instructions: Apply as directed by provider. Prim Dressing: Hydrofera Blue Ready Transfer Foam, 2.5x2.5 (in/in) ary Discharge Instructions: Apply Hydrofera Blue Ready to wound bed as directed Secondary Dressing: ABD Pad 5x9 (in/in) Discharge Instructions: Cover with ABD pad Electronic Signature(s) Signed: 08/01/2023 11:40:05 AM By: [name] PA-C Signed: 08/01/2023 1:41:37 PM By: [name] Entered By: [name] on 08/01/2023 10:10:21 -------------------------------------------------------------------------------- Problem List Details Patient Name: Date of Service: [name] 08/01/2023 9:00 A M Medical Record Number: [id number] Patient Account Number: [id number] Date of Birth/Sex: Treating RN: 1976/01/09 (47 y.o. 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 03/16/2022 No Yes severity E11.42 Type 2 diabetes mellitus with diabetic polyneuropathy 03/16/2022 No Yes Z79.4 Long term (current) use of insulin 02/22/2023 No Yes Esham, Emmerson A ([id number]) [id number].pdf Page 12 of 22 N18.6 End stage renal disease 07/14/2023 No Yes Z99.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 Alert and Oriented x 3. pleasant and cooperative. Notes Upon inspection I did perform debridement of the left heel clearway callus slough and subcutaneous tissue down to healthy tissue and she tolerated that today without complication overall I am extremely pleased with where we stand in that regard. With regard to the right heel actually did remove some of the callus and performed that callus paring today in order to help alleviate some of the pressure on the heel and ensure this did not crack open and worsen yet again. She tolerated that without complication. Electronic Signature(s) Signed: 08/01/2023 9:57:38 AM By: [name] PA-C Entered By: [name] on 08/01/2023 09:57:38 -------------------------------------------------------------------------------- Physician Orders Details Patient Name: Date of Service: [name] 08/01/2023 9:00 A M Medical Record Number: [id number] Patient Account Number: [id number] Date of Birth/Sex: Treating RN: 27-Jun-1976 (47 y.o. 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 Bathing/ Shower/ Hygiene May shower; gently cleanse wound with antibacterial soap, rinse and pat dry prior to dressing wounds No tub bath. Off-Loading Total Contact Cast to Left Lower Extremity Open toe surgical shoe - right foot Wound Treatment Wound #12R - Foot Wound Laterality: Plantar, Left, Medial Cleanser: Vashe 5.8 (oz) Discharge Instructions: Use vashe 5.8 (oz) as directed Topical: Gentamicin Discharge Instructions: Apply as directed by provider. Prim Dressing: Hydrofera Blue Ready Transfer Foam, 2.5x2.5 (in/in) ary Discharge Instructions: Apply Hydrofera Blue Ready to wound bed as directed Secondary Dressing: ABD Pad 5x9 (in/in) Discharge Instructions: Cover with ABD pad Electronic Signature(s) Signed: 08/01/2023 11:40:05 AM By: [name] PA-C Signed: 08/01/2023 1:41:37 PM By: [name] Entered By: [name] on 08/01/2023 10:10:21 -------------------------------------------------------------------------------- Problem List Details Patient Name: Date of Service: [name] 08/01/2023 9:00 A M Medical Record Number: [id number] Patient Account Number: [id number] Date of Birth/Sex: Treating RN: 1976/01/09 (47 y.o. 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 03/16/2022 No Yes severity E11.42 Type 2 diabetes mellitus with diabetic polyneuropathy 03/16/2022 No Yes Z79.4 Long term (current) use of insulin 02/22/2023 No Yes Esham, Emmerson A ([id number]) [id number].pdf Page 12 of 22 N18.6 End stage renal disease 07/14/2023 No Yes Z99.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 07/14/2023 No Yes L84 Corns and callosities 08/01/2023 No Yes Inactive Problems Resolved Problems ICD-10 Code Description Active Date Resolved Date L97.512 Non-pressure chronic ulcer of other part of right foot with fat layer exposed 03/16/2022 03/16/2022 L97.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 of skin 03/16/2022 03/16/2022 W54.0XXA Bitten by dog, initial encounter 05/17/2023 05/17/2023 S81.801A Unspecified open wound, right lower leg, initial encounter 05/17/2023 05/17/2023 G25.427  to Left Lower Extremity otal Open toe surgical shoe - right foot WOUND #12R: - Foot Wound Laterality: Plantar, Left, Medial Cleanser: Vashe 5.8 (oz) Discharge Instructions: Use vashe 5.8 (oz) as directed Topical: Gentamicin Discharge Instructions: Apply as directed by provider. Prim Dressing: Hydrofera Blue Ready Transfer Foam, 2.5x2.5 (in/in) ary Discharge Instructions: Apply Hydrofera Blue Ready to wound bed as directed Secondary Dressing: ABD Pad 5x9 (in/in) Discharge Instructions: Cover  with ABD pad 1. Based on what I am seeing I do believe that the patient is doing quite well with regard to her her right heel. I did perform some of the callus paring in order to clear away the thicker callus that does not crack open end up causing additional wound she is in agreement with this plan and post clearing away of that callus she seems to be doing much better I think she should continue with the urea cream. 2. I am also going to recommend that she continue with the [facility] Blue to the left heel and subsequently total contact casting along with the gentamicin. 3. Muscle can recommend she should continue to try to stay off her feet is much as possible she does that and it obviously shows she is doing a really good job here. We will see patient back for reevaluation in 1 week here in the clinic. If anything worsens or changes patient will contact our office for additional recommendations. Electronic Signature(s) Signed: 08/01/2023 10:00:18 AM By: [name] PA-C Previous Signature: 08/01/2023 9:58:30 AM Version By: [name] PA-C Entered By: [name] on 08/01/2023 10:00:17 Tobey Grim ([id number]) [id number] Page 21 of 22 -------------------------------------------------------------------------------- Total Contact Cast Details Patient Name: Date of Service: LOLETTA, HARPER 08/01/2023 9:00 A M Medical Record Number: [id number] Patient Account Number: [id number] Date of Birth/Sex: Treating RN: [birth year] (47 y.o. Ginette Pitman Primary Care Provider: [name] Other Clinician: [name] Referring Provider: Treating Provider/Extender: [name] Weeks in Treatment: 69 T Contact Cast Applied for Wound Assessment: otal Wound #12R Left,Medial,Plantar Foot Performed By: Physician [name], PA-C Post Procedure Diagnosis Same as Pre-procedure Notes Patient tolerated procedure well Electronic Signature(s) Signed:  08/01/2023 11:40:05 AM By: [name] PA-C Signed: 08/01/2023 1:41:37 PM By: [name] Entered By: [name] on 08/01/2023 10:10:01 -------------------------------------------------------------------------------- SuperBill Details Patient Name: Date of Service: [name] 08/01/2023 Medical Record Number: [id number] Patient Account Number: [id number] Date of Birth/Sex: Treating RN: 1976/08/10 (47 y.o. 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 Z79.4 Long term (current) use of insulin N18.6 End stage renal disease Z99.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 L84 Corns and callosities Facility Procedures : MARNIE, FAZZINO Code: [id number] RISTI A ([id number]) I Description: 11042 - DEB SUBQ TISSUE 20 SQ CM/< [phone] CD-10 Diagnosis Description 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i Modifier: [id number] ty Quantity: 1 17.pdf Page 22 of 22 : CPT4 Code: 01601093 11 I Description: 055 - PARE BENIGN LES; SGL CD-10 Diagnosis Description L84 Corns and callosities Modifier: 1 Quantity: Physician Procedures : CPT4 Code Description Modifier [id number] 11042 - WC PHYS SUBQ TISS 20 SQ CM ICD-10 Diagnosis Description L97.528 Non-pressure chronic ulcer of other part  to Left Lower Extremity otal Open toe surgical shoe - right foot WOUND #12R: - Foot Wound Laterality: Plantar, Left, Medial Cleanser: Vashe 5.8 (oz) Discharge Instructions: Use vashe 5.8 (oz) as directed Topical: Gentamicin Discharge Instructions: Apply as directed by provider. Prim Dressing: Hydrofera Blue Ready Transfer Foam, 2.5x2.5 (in/in) ary Discharge Instructions: Apply Hydrofera Blue Ready to wound bed as directed Secondary Dressing: ABD Pad 5x9 (in/in) Discharge Instructions: Cover  with ABD pad 1. Based on what I am seeing I do believe that the patient is doing quite well with regard to her her right heel. I did perform some of the callus paring in order to clear away the thicker callus that does not crack open end up causing additional wound she is in agreement with this plan and post clearing away of that callus she seems to be doing much better I think she should continue with the urea cream. 2. I am also going to recommend that she continue with the [facility] Blue to the left heel and subsequently total contact casting along with the gentamicin. 3. Muscle can recommend she should continue to try to stay off her feet is much as possible she does that and it obviously shows she is doing a really good job here. We will see patient back for reevaluation in 1 week here in the clinic. If anything worsens or changes patient will contact our office for additional recommendations. Electronic Signature(s) Signed: 08/01/2023 10:00:18 AM By: [name] PA-C Previous Signature: 08/01/2023 9:58:30 AM Version By: [name] PA-C Entered By: [name] on 08/01/2023 10:00:17 Tobey Grim ([id number]) [id number] Page 21 of 22 -------------------------------------------------------------------------------- Total Contact Cast Details Patient Name: Date of Service: LOLETTA, HARPER 08/01/2023 9:00 A M Medical Record Number: [id number] Patient Account Number: [id number] Date of Birth/Sex: Treating RN: [birth year] (47 y.o. Ginette Pitman Primary Care Provider: [name] Other Clinician: [name] Referring Provider: Treating Provider/Extender: [name] Weeks in Treatment: 69 T Contact Cast Applied for Wound Assessment: otal Wound #12R Left,Medial,Plantar Foot Performed By: Physician [name], PA-C Post Procedure Diagnosis Same as Pre-procedure Notes Patient tolerated procedure well Electronic Signature(s) Signed:  08/01/2023 11:40:05 AM By: [name] PA-C Signed: 08/01/2023 1:41:37 PM By: [name] Entered By: [name] on 08/01/2023 10:10:01 -------------------------------------------------------------------------------- SuperBill Details Patient Name: Date of Service: [name] 08/01/2023 Medical Record Number: [id number] Patient Account Number: [id number] Date of Birth/Sex: Treating RN: 1976/08/10 (47 y.o. 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 Z79.4 Long term (current) use of insulin N18.6 End stage renal disease Z99.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 L84 Corns and callosities Facility Procedures : MARNIE, FAZZINO Code: [id number] RISTI A ([id number]) I Description: 11042 - DEB SUBQ TISSUE 20 SQ CM/< [phone] CD-10 Diagnosis Description 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i Modifier: [id number] ty Quantity: 1 17.pdf Page 22 of 22 : CPT4 Code: 01601093 11 I Description: 055 - PARE BENIGN LES; SGL CD-10 Diagnosis Description L84 Corns and callosities Modifier: 1 Quantity: Physician Procedures : CPT4 Code Description Modifier [id number] 11042 - WC PHYS SUBQ TISS 20 SQ CM ICD-10 Diagnosis Description L97.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 Ginette Pitman Primary Care Provider: [name] Other Clinician: [name] Referring Provider: Treating Provider/Extender: [name] Weeks in Treatment: 44 Information Obtained from: Patient Chief Complaint Left foot ulcer Electronic Signature(s) Signed: 08/01/2023 10:00:37 AM By: [name] PA-C Previous Signature: 08/01/2023 8:49:31 AM Version By: [name] PA-C Entered By: [name] on 08/01/2023 10:00:37 -------------------------------------------------------------------------------- Debridement Details Patient Name: Date of Service: [name] 08/01/2023 9:00 A M Medical Record Number: [id number] Patient Account Number: [id number] Date of Birth/Sex: Treating RN: 1976/02/26 (47 y.o. Ginette Pitman Primary Care Provider: [name] Other Clinician: [name] Referring Provider: Treating Provider/Extender: [name] in Treatment: 71 Debridement Performed for Assessment: Wound #12R Left,Medial,Plantar Foot Performed By: Physician [name], PA-C Debridement Type: Debridement Severity of Tissue Pre Debridement: Fat layer exposed Level of Consciousness (Pre-procedure): Awake and Alert Pre-procedure Verification/Time Out Yes - 09:40 Taken: Start Time: 09:40 Percent of Wound Bed Debrided: 100% T Area Debrided (cm): otal 0.09 Tissue and other material debrided: Viable, Non-Viable, Callus, Slough, Subcutaneous, Slough Level: Skin/Subcutaneous Tissue Debridement Description: Excisional Instrument: Curette Bleeding: Minimum Hemostasis Achieved: Pressure Response to Treatment:  Procedure was tolerated well Level of Consciousness (Post- Awake and Alert MARIADEL, MRUK A ([id number]) [phone].pdf Page 2 of 22 Awake and Alert procedure): Post Debridement Measurements of Total Wound Length: (cm) 0.4 Width: (cm) 0.3 Depth: (cm) 0.1 Volume: (cm) 0.009 Character of Wound/Ulcer Post Debridement: Improved Severity of Tissue Post Debridement: Fat layer exposed Post Procedure Diagnosis Same as Pre-procedure Electronic Signature(s) Signed: 08/01/2023 11:40:05 AM By: [name] PA-C Signed: 08/01/2023 1:41:37 PM By: [name] Signed: 08/01/2023 5:02:18 PM By: Midge Aver MSN RN CNS WTA Entered By: [name] on 08/01/2023 09:42:43 -------------------------------------------------------------------------------- HPI Details Patient Name: Date of Service: [name], Emmelyn A. 08/01/2023 9:00 A M Medical Record Number: [id number] Patient Account Number: [id number] Date of Birth/Sex: Treating RN: 1975/12/12 (47 y.o. Ginette Pitman Primary Care Provider: [name] Other Clinician: [name] Referring Provider: Treating Provider/Extender: [name] in Treatment: 62 History of Present Illness HPI Description: 01/18/18-She is here for initial evaluation of the left great toe ulcer. She is a poor historian in regards to timeframe in detail. She states approximately 4 weeks ago she lacerated her toe on something in the house. She followed up with her primary care who placed her on Bactrim and ultimately a second dose of Bactrim prior to coming to wound clinic. She states she has been treating the toe with peroxide, Betadine and a Band-Aid. She did not check her blood sugar this morning but checked it yesterday morning it was 327; she is unaware of a recent A1c and there are no current records. She saw Dr. she would've orthopedics last week for an old injury to the left ankle, she states he did not see her toe, nor  did she bring it to his attention. She smokes approximately 1 pack cigarettes a day. Her social situation is concerning, she arrives this morning with her mother who appears extremely intoxicated/under the influence; her mother was asked to leave the room and be monitored by the patient's grandmother. The patient's aunt then accompanied the patient and the room throughout the rest of the appointment.  decisions and demonstrates good insight into disease process. Alert and Oriented x 3. pleasant and cooperative. Notes Upon inspection I did perform debridement of the left heel clearway callus slough and subcutaneous tissue down to healthy tissue and she tolerated that today without complication overall I am extremely pleased with where we stand in that regard. With regard to the right heel actually did remove some of the callus and performed that callus paring today in order to help alleviate some of the pressure on the heel and ensure this did not crack open and worsen yet again. She tolerated that without complication. Electronic Signature(s) Signed: 08/01/2023 9:57:38 AM By: [name] PA-C Entered By: [name] on 08/01/2023 09:57:38 -------------------------------------------------------------------------------- Physician Orders Details Patient Name: Date of Service: [name] 08/01/2023 9:00 A M Medical Record Number: [id number] Patient Account Number: [id number] Date of Birth/Sex: Treating RN: 27-Jun-1976 (47 y.o. 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 Bathing/ Shower/ Hygiene May shower; gently cleanse wound with antibacterial soap, rinse and pat dry prior to dressing wounds No tub bath. Off-Loading Total Contact Cast to Left Lower Extremity Open toe surgical shoe - right foot Wound Treatment Wound #12R - Foot Wound Laterality: Plantar, Left, Medial Cleanser: Vashe 5.8 (oz) Discharge Instructions: Use vashe 5.8 (oz) as directed Topical: Gentamicin Discharge Instructions: Apply as directed by provider. Prim Dressing: Hydrofera Blue Ready Transfer Foam, 2.5x2.5 (in/in) ary Discharge Instructions: Apply Hydrofera Blue Ready to wound bed as directed Secondary Dressing: ABD Pad 5x9 (in/in) Discharge Instructions: Cover with ABD pad Electronic Signature(s) Signed: 08/01/2023 11:40:05 AM By: [name] PA-C Signed: 08/01/2023 1:41:37 PM By: [name] Entered By: [name] on 08/01/2023 10:10:21 -------------------------------------------------------------------------------- Problem List Details Patient Name: Date of Service: [name] 08/01/2023 9:00 A M Medical Record Number: [id number] Patient Account Number: [id number] Date of Birth/Sex: Treating RN: 1976/01/09 (47 y.o. 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 03/16/2022 No Yes severity E11.42 Type 2 diabetes mellitus with diabetic polyneuropathy 03/16/2022 No Yes Z79.4 Long term (current) use of insulin 02/22/2023 No Yes Esham, Emmerson A ([id number]) [id number].pdf Page 12 of 22 N18.6 End stage renal disease 07/14/2023 No Yes Z99.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 07/14/2023 No Yes L84 Corns and callosities 08/01/2023 No Yes Inactive Problems Resolved Problems ICD-10 Code Description Active Date Resolved Date L97.512 Non-pressure chronic ulcer of other part of right foot with fat layer exposed 03/16/2022 03/16/2022 L97.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 of skin 03/16/2022 03/16/2022 W54.0XXA Bitten by dog, initial encounter 05/17/2023 05/17/2023 S81.801A Unspecified open wound, right lower leg, initial encounter 05/17/2023 05/17/2023 G25.427  Type 2 diabetes mellitus with other skin ulcer 05/17/2023 05/17/2023 Electronic Signature(s) Signed: 08/01/2023 9:57:30 AM By: [name] PA-C Previous Signature: 08/01/2023 8:49:27 AM Version By: [name] PA-C Entered By: [name] on 08/01/2023 09:57:30 -------------------------------------------------------------------------------- Progress Note Details Patient Name: Date of Service: [name] 08/01/2023 9:00 A M Medical Record Number: [id number] Patient Account Number: [id number] Date of Birth/Sex: Treating RN: 25-Feb-1976 (47 y.o. Ginette Pitman Primary Care Provider: [name] Other Clinician: [name] Referring Provider: Treating Provider/Extender: [name] in Treatment: 53 Subjective Chief Complaint Information obtained from Patient Left foot ulcer History of Present Illness (HPI) 01/18/18-She is here for initial evaluation of the left great toe ulcer. She is a poor historian in regards to timeframe in detail. She states approximately 4 weeks ago she lacerated her toe on something in the house. She followed up with her primary care who placed her on Bactrim and ultimately a second dose of Bactrim prior to coming to wound clinic. She states she has been treating the toe with peroxide, Betadine and a Band-Aid. She did not check her blood sugar this Grinnell, Veroncia A ([id number]) [phone].pdf Page 13 of 22 morning but checked it yesterday morning it was 327; she is unaware of a recent A1c and there are no current records. She saw Dr. she would've orthopedics last week for an old injury to the left ankle, she states he did not see her toe, nor did she bring it to his attention. She smokes approximately 1 pack cigarettes a day. Her social situation is concerning, she arrives this morning with her mother who appears extremely intoxicated/under the influence; her mother was asked to leave the room and be monitored by the patient's grandmother. The patient's aunt then accompanied the patient and the room throughout the rest of the appointment. We had a lengthy discussion regarding the deleterious effects of uncontrolled hyperglycemia and smoking as it relates to wound healing and overall health. She was strongly encouraged to decrease her smoking and get her diabetes under better control. She states she is currently on a diet and has cut down her [facility] consumption. The left toe is erythematous, macerated and slightly edematous with malodor present. The edema in her left foot is below her baseline, there is no erythema streaking. We will treat her with Santyl, doxycycline; we have ordered and xray,  culture and provided a Peg assist surgical shoe and cultured the wound. 01/25/18-She is here in follow-up evaluation for a left great toe ulcer and presents with an abscess to her suprapubic area. She states her blood sugars remain elevated, feeling "sick" and if levels are below 250, but she is trying. She has made no attempt to decrease her smoking stating that we "can't take away her food in her cigarettes". She has been compliant with offloading using the PEG assist you. She is using Santyl daily. the culture obtained last week grew staph aureus and Enterococcus faecalis; continues on the doxycycline and Augmentin was added on Monday. The suprapubic area has erythema, no femoral variation, purple discoloration, minimal induration, was accessed with a cotton tip applicator with sanguinopurulent drainage, this was cultured, I suspect the current antibiotic treatment will cover and we will not add anything to her current treatment plan. She was advised to go to urgent care or ER with any change in redness, induration or fever. 02/01/18-She is here in follow-up evaluation for left great toe ulcers and a new abdominal abscess from last week. She  ALLISHA, HARTER A ([id number]) [id number] Page 1 of 22 Visit Report for 08/01/2023 Chief Complaint Document Details Patient Name: Date of Service: TORRENCE, BRANAGAN 08/01/2023 9:00 A M Medical Record Number: [id number] Patient Account Number: [id number] Date of Birth/Sex: Treating RN: 03-22-1976 (47 y.o. Ginette Pitman Primary Care Provider: [name] Other Clinician: [name] Referring Provider: Treating Provider/Extender: [name] Weeks in Treatment: 44 Information Obtained from: Patient Chief Complaint Left foot ulcer Electronic Signature(s) Signed: 08/01/2023 10:00:37 AM By: [name] PA-C Previous Signature: 08/01/2023 8:49:31 AM Version By: [name] PA-C Entered By: [name] on 08/01/2023 10:00:37 -------------------------------------------------------------------------------- Debridement Details Patient Name: Date of Service: [name] 08/01/2023 9:00 A M Medical Record Number: [id number] Patient Account Number: [id number] Date of Birth/Sex: Treating RN: 1976/02/26 (47 y.o. Ginette Pitman Primary Care Provider: [name] Other Clinician: [name] Referring Provider: Treating Provider/Extender: [name] in Treatment: 71 Debridement Performed for Assessment: Wound #12R Left,Medial,Plantar Foot Performed By: Physician [name], PA-C Debridement Type: Debridement Severity of Tissue Pre Debridement: Fat layer exposed Level of Consciousness (Pre-procedure): Awake and Alert Pre-procedure Verification/Time Out Yes - 09:40 Taken: Start Time: 09:40 Percent of Wound Bed Debrided: 100% T Area Debrided (cm): otal 0.09 Tissue and other material debrided: Viable, Non-Viable, Callus, Slough, Subcutaneous, Slough Level: Skin/Subcutaneous Tissue Debridement Description: Excisional Instrument: Curette Bleeding: Minimum Hemostasis Achieved: Pressure Response to Treatment:  Procedure was tolerated well Level of Consciousness (Post- Awake and Alert MARIADEL, MRUK A ([id number]) [phone].pdf Page 2 of 22 Awake and Alert procedure): Post Debridement Measurements of Total Wound Length: (cm) 0.4 Width: (cm) 0.3 Depth: (cm) 0.1 Volume: (cm) 0.009 Character of Wound/Ulcer Post Debridement: Improved Severity of Tissue Post Debridement: Fat layer exposed Post Procedure Diagnosis Same as Pre-procedure Electronic Signature(s) Signed: 08/01/2023 11:40:05 AM By: [name] PA-C Signed: 08/01/2023 1:41:37 PM By: [name] Signed: 08/01/2023 5:02:18 PM By: Midge Aver MSN RN CNS WTA Entered By: [name] on 08/01/2023 09:42:43 -------------------------------------------------------------------------------- HPI Details Patient Name: Date of Service: [name], Emmelyn A. 08/01/2023 9:00 A M Medical Record Number: [id number] Patient Account Number: [id number] Date of Birth/Sex: Treating RN: 1975/12/12 (47 y.o. Ginette Pitman Primary Care Provider: [name] Other Clinician: [name] Referring Provider: Treating Provider/Extender: [name] in Treatment: 62 History of Present Illness HPI Description: 01/18/18-She is here for initial evaluation of the left great toe ulcer. She is a poor historian in regards to timeframe in detail. She states approximately 4 weeks ago she lacerated her toe on something in the house. She followed up with her primary care who placed her on Bactrim and ultimately a second dose of Bactrim prior to coming to wound clinic. She states she has been treating the toe with peroxide, Betadine and a Band-Aid. She did not check her blood sugar this morning but checked it yesterday morning it was 327; she is unaware of a recent A1c and there are no current records. She saw Dr. she would've orthopedics last week for an old injury to the left ankle, she states he did not see her toe, nor  did she bring it to his attention. She smokes approximately 1 pack cigarettes a day. Her social situation is concerning, she arrives this morning with her mother who appears extremely intoxicated/under the influence; her mother was asked to leave the room and be monitored by the patient's grandmother. The patient's aunt then accompanied the patient and the room throughout the rest of the appointment.  ALLISHA, HARTER A ([id number]) [id number] Page 1 of 22 Visit Report for 08/01/2023 Chief Complaint Document Details Patient Name: Date of Service: TORRENCE, BRANAGAN 08/01/2023 9:00 A M Medical Record Number: [id number] Patient Account Number: [id number] Date of Birth/Sex: Treating RN: 03-22-1976 (47 y.o. Ginette Pitman Primary Care Provider: [name] Other Clinician: [name] Referring Provider: Treating Provider/Extender: [name] Weeks in Treatment: 44 Information Obtained from: Patient Chief Complaint Left foot ulcer Electronic Signature(s) Signed: 08/01/2023 10:00:37 AM By: [name] PA-C Previous Signature: 08/01/2023 8:49:31 AM Version By: [name] PA-C Entered By: [name] on 08/01/2023 10:00:37 -------------------------------------------------------------------------------- Debridement Details Patient Name: Date of Service: [name] 08/01/2023 9:00 A M Medical Record Number: [id number] Patient Account Number: [id number] Date of Birth/Sex: Treating RN: 1976/02/26 (47 y.o. Ginette Pitman Primary Care Provider: [name] Other Clinician: [name] Referring Provider: Treating Provider/Extender: [name] in Treatment: 71 Debridement Performed for Assessment: Wound #12R Left,Medial,Plantar Foot Performed By: Physician [name], PA-C Debridement Type: Debridement Severity of Tissue Pre Debridement: Fat layer exposed Level of Consciousness (Pre-procedure): Awake and Alert Pre-procedure Verification/Time Out Yes - 09:40 Taken: Start Time: 09:40 Percent of Wound Bed Debrided: 100% T Area Debrided (cm): otal 0.09 Tissue and other material debrided: Viable, Non-Viable, Callus, Slough, Subcutaneous, Slough Level: Skin/Subcutaneous Tissue Debridement Description: Excisional Instrument: Curette Bleeding: Minimum Hemostasis Achieved: Pressure Response to Treatment:  Procedure was tolerated well Level of Consciousness (Post- Awake and Alert MARIADEL, MRUK A ([id number]) [phone].pdf Page 2 of 22 Awake and Alert procedure): Post Debridement Measurements of Total Wound Length: (cm) 0.4 Width: (cm) 0.3 Depth: (cm) 0.1 Volume: (cm) 0.009 Character of Wound/Ulcer Post Debridement: Improved Severity of Tissue Post Debridement: Fat layer exposed Post Procedure Diagnosis Same as Pre-procedure Electronic Signature(s) Signed: 08/01/2023 11:40:05 AM By: [name] PA-C Signed: 08/01/2023 1:41:37 PM By: [name] Signed: 08/01/2023 5:02:18 PM By: Midge Aver MSN RN CNS WTA Entered By: [name] on 08/01/2023 09:42:43 -------------------------------------------------------------------------------- HPI Details Patient Name: Date of Service: [name], Emmelyn A. 08/01/2023 9:00 A M Medical Record Number: [id number] Patient Account Number: [id number] Date of Birth/Sex: Treating RN: 1975/12/12 (47 y.o. Ginette Pitman Primary Care Provider: [name] Other Clinician: [name] Referring Provider: Treating Provider/Extender: [name] in Treatment: 62 History of Present Illness HPI Description: 01/18/18-She is here for initial evaluation of the left great toe ulcer. She is a poor historian in regards to timeframe in detail. She states approximately 4 weeks ago she lacerated her toe on something in the house. She followed up with her primary care who placed her on Bactrim and ultimately a second dose of Bactrim prior to coming to wound clinic. She states she has been treating the toe with peroxide, Betadine and a Band-Aid. She did not check her blood sugar this morning but checked it yesterday morning it was 327; she is unaware of a recent A1c and there are no current records. She saw Dr. she would've orthopedics last week for an old injury to the left ankle, she states he did not see her toe, nor  did she bring it to his attention. She smokes approximately 1 pack cigarettes a day. Her social situation is concerning, she arrives this morning with her mother who appears extremely intoxicated/under the influence; her mother was asked to leave the room and be monitored by the patient's grandmother. The patient's aunt then accompanied the patient and the room throughout the rest of the appointment.  ALLISHA, HARTER A ([id number]) [id number] Page 1 of 22 Visit Report for 08/01/2023 Chief Complaint Document Details Patient Name: Date of Service: TORRENCE, BRANAGAN 08/01/2023 9:00 A M Medical Record Number: [id number] Patient Account Number: [id number] Date of Birth/Sex: Treating RN: 03-22-1976 (47 y.o. Ginette Pitman Primary Care Provider: [name] Other Clinician: [name] Referring Provider: Treating Provider/Extender: [name] Weeks in Treatment: 44 Information Obtained from: Patient Chief Complaint Left foot ulcer Electronic Signature(s) Signed: 08/01/2023 10:00:37 AM By: [name] PA-C Previous Signature: 08/01/2023 8:49:31 AM Version By: [name] PA-C Entered By: [name] on 08/01/2023 10:00:37 -------------------------------------------------------------------------------- Debridement Details Patient Name: Date of Service: [name] 08/01/2023 9:00 A M Medical Record Number: [id number] Patient Account Number: [id number] Date of Birth/Sex: Treating RN: 1976/02/26 (47 y.o. Ginette Pitman Primary Care Provider: [name] Other Clinician: [name] Referring Provider: Treating Provider/Extender: [name] in Treatment: 71 Debridement Performed for Assessment: Wound #12R Left,Medial,Plantar Foot Performed By: Physician [name], PA-C Debridement Type: Debridement Severity of Tissue Pre Debridement: Fat layer exposed Level of Consciousness (Pre-procedure): Awake and Alert Pre-procedure Verification/Time Out Yes - 09:40 Taken: Start Time: 09:40 Percent of Wound Bed Debrided: 100% T Area Debrided (cm): otal 0.09 Tissue and other material debrided: Viable, Non-Viable, Callus, Slough, Subcutaneous, Slough Level: Skin/Subcutaneous Tissue Debridement Description: Excisional Instrument: Curette Bleeding: Minimum Hemostasis Achieved: Pressure Response to Treatment:  Procedure was tolerated well Level of Consciousness (Post- Awake and Alert MARIADEL, MRUK A ([id number]) [phone].pdf Page 2 of 22 Awake and Alert procedure): Post Debridement Measurements of Total Wound Length: (cm) 0.4 Width: (cm) 0.3 Depth: (cm) 0.1 Volume: (cm) 0.009 Character of Wound/Ulcer Post Debridement: Improved Severity of Tissue Post Debridement: Fat layer exposed Post Procedure Diagnosis Same as Pre-procedure Electronic Signature(s) Signed: 08/01/2023 11:40:05 AM By: [name] PA-C Signed: 08/01/2023 1:41:37 PM By: [name] Signed: 08/01/2023 5:02:18 PM By: Midge Aver MSN RN CNS WTA Entered By: [name] on 08/01/2023 09:42:43 -------------------------------------------------------------------------------- HPI Details Patient Name: Date of Service: [name], Emmelyn A. 08/01/2023 9:00 A M Medical Record Number: [id number] Patient Account Number: [id number] Date of Birth/Sex: Treating RN: 1975/12/12 (47 y.o. Ginette Pitman Primary Care Provider: [name] Other Clinician: [name] Referring Provider: Treating Provider/Extender: [name] in Treatment: 62 History of Present Illness HPI Description: 01/18/18-She is here for initial evaluation of the left great toe ulcer. She is a poor historian in regards to timeframe in detail. She states approximately 4 weeks ago she lacerated her toe on something in the house. She followed up with her primary care who placed her on Bactrim and ultimately a second dose of Bactrim prior to coming to wound clinic. She states she has been treating the toe with peroxide, Betadine and a Band-Aid. She did not check her blood sugar this morning but checked it yesterday morning it was 327; she is unaware of a recent A1c and there are no current records. She saw Dr. she would've orthopedics last week for an old injury to the left ankle, she states he did not see her toe, nor  did she bring it to his attention. She smokes approximately 1 pack cigarettes a day. Her social situation is concerning, she arrives this morning with her mother who appears extremely intoxicated/under the influence; her mother was asked to leave the room and be monitored by the patient's grandmother. The patient's aunt then accompanied the patient and the room throughout the rest of the appointment.  to Left Lower Extremity otal Open toe surgical shoe - right foot WOUND #12R: - Foot Wound Laterality: Plantar, Left, Medial Cleanser: Vashe 5.8 (oz) Discharge Instructions: Use vashe 5.8 (oz) as directed Topical: Gentamicin Discharge Instructions: Apply as directed by provider. Prim Dressing: Hydrofera Blue Ready Transfer Foam, 2.5x2.5 (in/in) ary Discharge Instructions: Apply Hydrofera Blue Ready to wound bed as directed Secondary Dressing: ABD Pad 5x9 (in/in) Discharge Instructions: Cover  with ABD pad 1. Based on what I am seeing I do believe that the patient is doing quite well with regard to her her right heel. I did perform some of the callus paring in order to clear away the thicker callus that does not crack open end up causing additional wound she is in agreement with this plan and post clearing away of that callus she seems to be doing much better I think she should continue with the urea cream. 2. I am also going to recommend that she continue with the [facility] Blue to the left heel and subsequently total contact casting along with the gentamicin. 3. Muscle can recommend she should continue to try to stay off her feet is much as possible she does that and it obviously shows she is doing a really good job here. We will see patient back for reevaluation in 1 week here in the clinic. If anything worsens or changes patient will contact our office for additional recommendations. Electronic Signature(s) Signed: 08/01/2023 10:00:18 AM By: [name] PA-C Previous Signature: 08/01/2023 9:58:30 AM Version By: [name] PA-C Entered By: [name] on 08/01/2023 10:00:17 Tobey Grim ([id number]) [id number] Page 21 of 22 -------------------------------------------------------------------------------- Total Contact Cast Details Patient Name: Date of Service: LOLETTA, HARPER 08/01/2023 9:00 A M Medical Record Number: [id number] Patient Account Number: [id number] Date of Birth/Sex: Treating RN: [birth year] (47 y.o. Ginette Pitman Primary Care Provider: [name] Other Clinician: [name] Referring Provider: Treating Provider/Extender: [name] Weeks in Treatment: 69 T Contact Cast Applied for Wound Assessment: otal Wound #12R Left,Medial,Plantar Foot Performed By: Physician [name], PA-C Post Procedure Diagnosis Same as Pre-procedure Notes Patient tolerated procedure well Electronic Signature(s) Signed:  08/01/2023 11:40:05 AM By: [name] PA-C Signed: 08/01/2023 1:41:37 PM By: [name] Entered By: [name] on 08/01/2023 10:10:01 -------------------------------------------------------------------------------- SuperBill Details Patient Name: Date of Service: [name] 08/01/2023 Medical Record Number: [id number] Patient Account Number: [id number] Date of Birth/Sex: Treating RN: 1976/08/10 (47 y.o. 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 Z79.4 Long term (current) use of insulin N18.6 End stage renal disease Z99.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 L84 Corns and callosities Facility Procedures : MARNIE, FAZZINO Code: [id number] RISTI A ([id number]) I Description: 11042 - DEB SUBQ TISSUE 20 SQ CM/< [phone] CD-10 Diagnosis Description 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 8/14; 8/14; patient has 3 wounds 1 on the left plantar heel 1 on the right plantar heel and a small wound on the right anterior lower leg. We have been using Hydrofera Blue in the left heel Aquacel on the right heel. The left heel has been in a total contact cast 8/21; patient presents for follow-up. We have been using Hydrofera Blue to the left foot wound under the total contact cast. T the right heel patient has been o using Aquacel Ag. T the anterior leg wound antibiotic ointment. All wounds have healed. o 8/28; patient presents for follow-up. She has been using her surgical shoe with offloading felt pads. Her wounds have remained closed. 9/11; patient presents for final follow-up to assure that her wounds have remained closed. Unfortunately the left foot wound has opened again. But she has been wearing her Surgical shoe for offloading with felt pad. 07-14-2023 upon evaluation today patient appears to be doing well currently in regard to her wound. She has been tolerating the dressing changes without complication with total contact casting. I am going to be helping take care of her at this point  as she is transferring from Dr. Mikey Bussing to me since Dr. Mikey Bussing is on maternity leave. 07-24-2023 upon evaluation today patient appears to be doing decently well currently in regard to her wound. She is actually showing signs of improvement in regard to the left foot and I feel like this is much better than last week no debridement even necessary today. We may have to perform some slight debridement come next week we will see how things go. Monitor for any signs of worsening overall and obviously if anything changes she should be letting us know. 08-01-2023 upon evaluation today patient appears to be doing excellent in regard to her feet. The right foot/heel area is completely healed she does have some callus buildup I am going to trim this down a little bit I do not want to build up to the point of cracking she is doing well with the urea cream. With regard to the left heel she is doing well with casting I am going to perform some debridement here to clear away before reapplying the cast and I think she is really doing quite well though. Electronic Signature(s) Signed: 08/01/2023 9:56:55 AM By: [name] PA-C Entered By: [name] on 08/01/2023 09:56:55 -------------------------------------------------------------------------------- Callus Pairing Details Patient Name: Date of Service: [name] 08/01/2023 9:00 A M Medical Record Number: [id number] Patient Account Number: [id number] Date of Birth/Sex: Treating RN: 02-13-1976 (47 y.o. Ginette Pitman Primary Care Provider: [name] Other Clinician: [name] Referring Provider: Treating Provider/Extender: [name] in Treatment: 71 Procedure Performed for: Non-Wound Location Performed By: Physician [name], PA-C Post Procedure Diagnosis Same as Pre-procedure Notes Right heel using a 3mm curette to prevent callus splitting and causing a new wound. Electronic Signature(s) Signed: 08/01/2023  9:59:59 AM By: [name] PA-C Entered By: [name] on 08/01/2023 09:59:59 Earmon Phoenix A ([id number]) [id number] Page 10 of 22 -------------------------------------------------------------------------------- Physical Exam Details Patient Name: Date of Service: KEARSTIN, LEARN 08/01/2023 9:00 A M Medical Record Number: [id number] Patient Account Number: [id number] Date of Birth/Sex: Treating RN: 26-Aug-1976 (47 y.o. Ginette Pitman Primary Care Provider: [name] Other Clinician: [name] Referring Provider: Treating Provider/Extender: [name] Weeks in Treatment: 74 Constitutional Well-nourished and well-hydrated in no acute distress. Respiratory normal breathing without difficulty. Psychiatric this patient is able to make  to Left Lower Extremity otal Open toe surgical shoe - right foot WOUND #12R: - Foot Wound Laterality: Plantar, Left, Medial Cleanser: Vashe 5.8 (oz) Discharge Instructions: Use vashe 5.8 (oz) as directed Topical: Gentamicin Discharge Instructions: Apply as directed by provider. Prim Dressing: Hydrofera Blue Ready Transfer Foam, 2.5x2.5 (in/in) ary Discharge Instructions: Apply Hydrofera Blue Ready to wound bed as directed Secondary Dressing: ABD Pad 5x9 (in/in) Discharge Instructions: Cover  with ABD pad 1. Based on what I am seeing I do believe that the patient is doing quite well with regard to her her right heel. I did perform some of the callus paring in order to clear away the thicker callus that does not crack open end up causing additional wound she is in agreement with this plan and post clearing away of that callus she seems to be doing much better I think she should continue with the urea cream. 2. I am also going to recommend that she continue with the [facility] Blue to the left heel and subsequently total contact casting along with the gentamicin. 3. Muscle can recommend she should continue to try to stay off her feet is much as possible she does that and it obviously shows she is doing a really good job here. We will see patient back for reevaluation in 1 week here in the clinic. If anything worsens or changes patient will contact our office for additional recommendations. Electronic Signature(s) Signed: 08/01/2023 10:00:18 AM By: [name] PA-C Previous Signature: 08/01/2023 9:58:30 AM Version By: [name] PA-C Entered By: [name] on 08/01/2023 10:00:17 Tobey Grim ([id number]) [id number] Page 21 of 22 -------------------------------------------------------------------------------- Total Contact Cast Details Patient Name: Date of Service: LOLETTA, HARPER 08/01/2023 9:00 A M Medical Record Number: [id number] Patient Account Number: [id number] Date of Birth/Sex: Treating RN: [birth year] (47 y.o. Ginette Pitman Primary Care Provider: [name] Other Clinician: [name] Referring Provider: Treating Provider/Extender: [name] Weeks in Treatment: 69 T Contact Cast Applied for Wound Assessment: otal Wound #12R Left,Medial,Plantar Foot Performed By: Physician [name], PA-C Post Procedure Diagnosis Same as Pre-procedure Notes Patient tolerated procedure well Electronic Signature(s) Signed:  08/01/2023 11:40:05 AM By: [name] PA-C Signed: 08/01/2023 1:41:37 PM By: [name] Entered By: [name] on 08/01/2023 10:10:01 -------------------------------------------------------------------------------- SuperBill Details Patient Name: Date of Service: [name] 08/01/2023 Medical Record Number: [id number] Patient Account Number: [id number] Date of Birth/Sex: Treating RN: 1976/08/10 (47 y.o. 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 Z79.4 Long term (current) use of insulin N18.6 End stage renal disease Z99.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 L84 Corns and callosities Facility Procedures : MARNIE, FAZZINO Code: [id number] RISTI A ([id number]) I Description: 11042 - DEB SUBQ TISSUE 20 SQ CM/< [phone] CD-10 Diagnosis Description 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i Modifier: [id number] ty Quantity: 1 17.pdf Page 22 of 22 : CPT4 Code: 01601093 11 I Description: 055 - PARE BENIGN LES; SGL CD-10 Diagnosis Description L84 Corns and callosities Modifier: 1 Quantity: Physician Procedures : CPT4 Code Description Modifier [id number] 11042 - WC PHYS SUBQ TISS 20 SQ CM ICD-10 Diagnosis Description L97.528 Non-pressure chronic ulcer of other part  Type 2 diabetes mellitus with other skin ulcer 05/17/2023 05/17/2023 Electronic Signature(s) Signed: 08/01/2023 9:57:30 AM By: [name] PA-C Previous Signature: 08/01/2023 8:49:27 AM Version By: [name] PA-C Entered By: [name] on 08/01/2023 09:57:30 -------------------------------------------------------------------------------- Progress Note Details Patient Name: Date of Service: [name] 08/01/2023 9:00 A M Medical Record Number: [id number] Patient Account Number: [id number] Date of Birth/Sex: Treating RN: 25-Feb-1976 (47 y.o. Ginette Pitman Primary Care Provider: [name] Other Clinician: [name] Referring Provider: Treating Provider/Extender: [name] in Treatment: 53 Subjective Chief Complaint Information obtained from Patient Left foot ulcer History of Present Illness (HPI) 01/18/18-She is here for initial evaluation of the left great toe ulcer. She is a poor historian in regards to timeframe in detail. She states approximately 4 weeks ago she lacerated her toe on something in the house. She followed up with her primary care who placed her on Bactrim and ultimately a second dose of Bactrim prior to coming to wound clinic. She states she has been treating the toe with peroxide, Betadine and a Band-Aid. She did not check her blood sugar this Grinnell, Veroncia A ([id number]) [phone].pdf Page 13 of 22 morning but checked it yesterday morning it was 327; she is unaware of a recent A1c and there are no current records. She saw Dr. she would've orthopedics last week for an old injury to the left ankle, she states he did not see her toe, nor did she bring it to his attention. She smokes approximately 1 pack cigarettes a day. Her social situation is concerning, she arrives this morning with her mother who appears extremely intoxicated/under the influence; her mother was asked to leave the room and be monitored by the patient's grandmother. The patient's aunt then accompanied the patient and the room throughout the rest of the appointment. We had a lengthy discussion regarding the deleterious effects of uncontrolled hyperglycemia and smoking as it relates to wound healing and overall health. She was strongly encouraged to decrease her smoking and get her diabetes under better control. She states she is currently on a diet and has cut down her [facility] consumption. The left toe is erythematous, macerated and slightly edematous with malodor present. The edema in her left foot is below her baseline, there is no erythema streaking. We will treat her with Santyl, doxycycline; we have ordered and xray,  culture and provided a Peg assist surgical shoe and cultured the wound. 01/25/18-She is here in follow-up evaluation for a left great toe ulcer and presents with an abscess to her suprapubic area. She states her blood sugars remain elevated, feeling "sick" and if levels are below 250, but she is trying. She has made no attempt to decrease her smoking stating that we "can't take away her food in her cigarettes". She has been compliant with offloading using the PEG assist you. She is using Santyl daily. the culture obtained last week grew staph aureus and Enterococcus faecalis; continues on the doxycycline and Augmentin was added on Monday. The suprapubic area has erythema, no femoral variation, purple discoloration, minimal induration, was accessed with a cotton tip applicator with sanguinopurulent drainage, this was cultured, I suspect the current antibiotic treatment will cover and we will not add anything to her current treatment plan. She was advised to go to urgent care or ER with any change in redness, induration or fever. 02/01/18-She is here in follow-up evaluation for left great toe ulcers and a new abdominal abscess from last week. She

## 2023-08-03 ENCOUNTER — Encounter: Payer: MEDICAID | Admitting: Physician Assistant

## 2023-08-07 ENCOUNTER — Encounter: Payer: MEDICAID | Admitting: Physician Assistant

## 2023-08-07 DIAGNOSIS — I5022 Chronic systolic (congestive) heart failure: Secondary | ICD-10-CM | POA: Diagnosis not present

## 2023-08-07 NOTE — Progress Notes (Signed)
Discharge Instructions: Cover with ABD pad TANAIA, HAWKEY A ([id number]) [id number] Page 10 of 20 Electronic Signature(s) Signed: 08/07/2023 4:48:00 PM By: [name] PA-C Signed: 08/08/2023 4:35:53 PM By: [name] Entered By: [name] on 08/07/2023 10:22:27 -------------------------------------------------------------------------------- Problem List Details Patient Name: Date of Service: [name] 08/07/2023 12:45 PM Medical Record Number: [id number] Patient Account Number: [id number] Date of Birth/Sex: Treating RN: Sep 03, 1976 (47 y.o. 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 03/16/2022 No Yes severity E11.42 Type 2 diabetes mellitus with diabetic polyneuropathy 03/16/2022 No Yes Z79.4 Long term (current) use of insulin 02/22/2023 No Yes N18.6 End stage renal disease 07/14/2023 No Yes Z99.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 07/14/2023 No Yes L84 Corns and callosities 08/01/2023 No Yes Inactive Problems Resolved Problems ICD-10 Code  Description Active Date Resolved Date L97.512 Non-pressure chronic ulcer of other part of right foot with fat layer exposed 03/16/2022 03/16/2022 L97.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 of skin 03/16/2022 03/16/2022 ASYIA, HORNUNG A ([id number]) [id number] Page 11 of 20 W54.0XXA Bitten by dog, initial encounter 05/17/2023 05/17/2023 S81.801A Unspecified open wound, right lower leg, initial encounter 05/17/2023 05/17/2023 Q03.474 Type 2 diabetes mellitus with other skin ulcer 05/17/2023 05/17/2023 Electronic Signature(s) Signed: 08/07/2023 12:44:43 PM By: [name] PA-C Entered By: [name] on 08/07/2023 09:44:43 -------------------------------------------------------------------------------- Progress Note Details Patient Name: Date of Service: [name] 08/07/2023 12:45 PM Medical Record Number: [id number] Patient Account Number: [id number] Date of Birth/Sex: Treating RN: [birth year] (47 y.o. [name] Primary Care Provider: [name] Other Clinician: [name] Referring Provider: Treating Provider/Extender: [name] in Treatment: 72 Subjective Chief Complaint Information obtained from Patient Left foot ulcer History of Present Illness (HPI) 01/18/18-She is here for initial evaluation of the left great toe ulcer. She is a poor historian in regards to timeframe in detail. She states approximately 4 weeks ago she lacerated her toe on something in the house. She followed up with her primary care who placed her on Bactrim and ultimately a second dose of Bactrim prior to coming to wound clinic. She states she has been treating the toe with peroxide, Betadine and a Band-Aid. She did not check her blood sugar this morning but checked it yesterday morning it was 327; she is unaware of a recent A1c and there are no current records. She saw Dr. she would've orthopedics last week  for an old injury to the left ankle, she states he did not see her toe, nor did she bring it to his attention. She smokes approximately 1 pack cigarettes a day. Her social situation is concerning, she arrives this morning with her mother who appears extremely intoxicated/under the influence; her mother was asked to leave the room and be monitored by the patient's grandmother. The patient's aunt then accompanied the patient and the room throughout the rest of the appointment. We had a lengthy discussion regarding the deleterious effects of uncontrolled hyperglycemia and smoking as it relates to wound healing and overall health. She was strongly encouraged to decrease her smoking and get her diabetes under better control. She states she is currently on a diet and has cut down her [facility]  this is much better than last week no debridement even necessary today. We may have to perform some slight debridement come next week we will see how things go. Monitor for any signs of worsening overall and obviously if anything changes she should be letting us know. 08-01-2023 upon evaluation today patient appears to be doing excellent in regard to her feet. The right foot/heel area is completely healed she does have some callus buildup I am going to trim this down a little bit I do not want to build up to the point of cracking she is doing well with the urea cream. With regard to the left heel she is doing well with casting I am going to perform some debridement here to clear away before reapplying the cast and I think she is really doing quite well though. 08-07-2023 upon evaluation patient's wound actually showed signs of excellent improvement and actually very pleased with where we stand I do believe that she is making good headway towards complete closure which is great news. Electronic Signature(s) Signed: 08/07/2023 1:53:19 PM By: [name] PA-C Previous Signature: 08/07/2023 1:18:48 PM Version By: [name] PA-C Entered By: [name] on 08/07/2023 10:53:19 -------------------------------------------------------------------------------- Physical Exam Details Patient Name: Date of Service: KIDA, DIGIULIO 08/07/2023 12:45 PM Medical Record Number: [id number] Patient Account Number: [id number] Date of Birth/Sex: Treating RN: 08-19-1976 (47 y.o. [name] Primary Care Provider: [name] Other Clinician: [name] Referring Provider: Treating  Provider/Extender: [name] Weeks in Treatment: 24 Constitutional Well-nourished and well-hydrated in no acute distress. Respiratory normal breathing without difficulty. CANDANCE, BOHLMAN A ([id number]) [id number] Page 9 of 20 Psychiatric this patient is able to make decisions and demonstrates good insight into disease process. Alert and Oriented x 3. pleasant and cooperative. Notes Patient's wound bed showed signs of good granulation and epithelization at this point. Fortunately I think that she is doing well with a total contact casting working to continue as such with that and I did go ahead and reapply that here in the office today. Electronic Signature(s) Signed: 08/07/2023 1:53:36 PM By: [name] PA-C Entered By: [name] on 08/07/2023 10:53:36 -------------------------------------------------------------------------------- Physician Orders Details Patient Name: Date of Service: [name] 08/07/2023 12:45 PM Medical Record Number: [id number] Patient Account Number: [id number] Date of Birth/Sex: Treating RN: 02/05/1976 (47 y.o. 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 Bathing/ Shower/ Hygiene May shower; gently cleanse wound with antibacterial soap, rinse and pat dry  prior to dressing wounds No tub bath. Off-Loading Total Contact Cast to Left Lower Extremity Open toe surgical shoe - right foot Wound Treatment Wound #12R - Foot Wound Laterality: Plantar, Left, Medial Cleanser: Vashe 5.8 (oz) Discharge Instructions: Use vashe 5.8 (oz) as directed Topical: Gentamicin Discharge Instructions: Apply as directed by provider. Prim Dressing: Hydrofera Blue Ready Transfer Foam, 2.5x2.5 (in/in) ary Discharge Instructions: Apply Hydrofera Blue Ready to wound bed as directed Secondary Dressing: ABD Pad 5x9 (in/in)  Type 2 diabetes mellitus with diabetic polyneuropathy Long term (current) use of insulin End stage renal disease Dependence on renal dialysis Corns and callosities Procedures Wound #12R Pre-procedure diagnosis of Wound #12R is a Diabetic Wound/Ulcer of the Lower Extremity located on the Left,Medial,Plantar Foot . There was a T Contact otal Cast Procedure by [name], PA-CMaryclare Bean, Carmin A ([id number]) [id number] Page 19 of 20 Post procedure Diagnosis Wound #12R: Same as Pre-Procedure Plan Follow-up Appointments: Return Appointment in 1 week. Bathing/ Shower/ Hygiene: May shower; gently cleanse wound with antibacterial soap, rinse and pat dry prior to dressing wounds No tub bath. Off-Loading: T Contact Cast to Left Lower Extremity otal Open toe surgical shoe - right foot WOUND #12R: - Foot Wound Laterality: Plantar, Left, Medial Cleanser: Vashe 5.8 (oz) Discharge Instructions: Use vashe 5.8 (oz) as directed Topical: Gentamicin Discharge Instructions: Apply as directed by  provider. Prim Dressing: Hydrofera Blue Ready Transfer Foam, 2.5x2.5 (in/in) ary Discharge Instructions: Apply Hydrofera Blue Ready to wound bed as directed Secondary Dressing: ABD Pad 5x9 (in/in) Discharge Instructions: Cover with ABD pad 1. I am going to recommend that we continue with a total contact cast which does seem to be doing a great job here. 2. Working to continue as well with the [employer]	[place] which I think is doing excellent. We will see patient back for reevaluation in 1 week here in the clinic. If anything worsens or changes patient will contact our office for additional recommendations. Electronic Signature(s) Signed: 08/07/2023 1:53:55 PM By: [name] PA-C Entered By: [name] on 08/07/2023 10:53:55 -------------------------------------------------------------------------------- Total Contact Cast Details Patient Name: Date of Service: TITILAYO, HAGANS 08/07/2023 12:45 PM Medical Record Number: [id number] Patient Account Number: [id number] Date of Birth/Sex: Treating RN: 1976-01-15 (47 y.o. [name] Primary Care Provider: [name] Other Clinician: [name] Referring Provider: Treating Provider/Extender: [name] Weeks in Treatment: 72 T Contact Cast Applied for Wound Assessment: otal Wound #12R Left,Medial,Plantar Foot Performed By: Physician [name], PA-C Post Procedure Diagnosis Same as Pre-procedure Electronic Signature(s) Signed: 08/07/2023 4:48:00 PM By: [name] PA-C Signed: 08/08/2023 4:35:53 PM By: [name] Entered By: [name] on 08/07/2023 10:21:52 Earmon Phoenix A ([id number]) [id number] Page 20 of 20 -------------------------------------------------------------------------------- SuperBill Details Patient Name: Date of Service: AVICE, FUNCHESS 08/07/2023 Medical Record Number: [id number] Patient Account Number: [id number] Date of Birth/Sex: Treating  RN: 08/09/1976 (47 y.o. 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.42 Type 2 diabetes mellitus with diabetic polyneuropathy Z79.4 Long term (current) use of insulin N18.6 End stage renal disease Z99.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 L84 Corns and callosities Facility Procedures : CPT4 Code: 32202542 Description: 29445 - APPLY TOTAL CONTACT LEG CAST ICD-10 Diagnosis Description 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 Modifier: ity Quantity: 1 Physician Procedures : CPT4 Code Description Modifier [id number] - WC PHYS APPLY TOTAL CONTACT CAST ICD-10 Diagnosis Description 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ity Quantity: 1 Electronic Signature(s) Signed: 08/07/2023 1:54:31 PM By: [name] PA-C Previous Signature: 08/07/2023 1:54:08 PM Version By: [name] PA-C Entered By: [name] on 08/07/2023 10:54:31  LAMIYAH, SCHLOTTER A ([id number]) [id number] Page 1 of 20 Visit Report for 08/07/2023 Chief Complaint Document Details Patient Name: Date of Service: ODYSSEY, VASBINDER 08/07/2023 12:45 PM Medical Record Number: [id number] Patient Account Number: [id number] Date of Birth/Sex: Treating RN: 24-Apr-1976 (47 y.o. [name] Primary Care Provider: [name] Other Clinician: [name] Referring Provider: Treating Provider/Extender: [name] Weeks in Treatment: 72 Information Obtained from: Patient Chief Complaint Left foot ulcer Electronic Signature(s) Signed: 08/07/2023 12:44:47 PM By: [name] PA-C Entered By: [name] on 08/07/2023 09:44:47 -------------------------------------------------------------------------------- HPI Details Patient Name: Date of Service: [name] 08/07/2023 12:45 PM Medical Record Number: [id number] Patient Account Number: [id number] Date of Birth/Sex: Treating RN: Mar 24, 1976 (47 y.o. [name] Primary Care Provider: [name] Other Clinician: [name] Referring Provider: Treating Provider/Extender: [name] in Treatment: 72 History of Present Illness HPI Description: 01/18/18-She is here for initial evaluation of the left great toe ulcer. She is a poor historian in regards to timeframe in detail. She states approximately 4 weeks ago she lacerated her toe on something in the house. She followed up with her primary care who placed her on Bactrim and ultimately a second dose of Bactrim prior to coming to wound clinic. She states she has been treating the toe with peroxide, Betadine and a Band-Aid. She did not check her blood sugar this morning but checked it yesterday morning it was 327; she is unaware of a recent A1c and there are no current records. She saw Dr. she would've orthopedics last week for an old injury to the left ankle, she states he did  not see her toe, nor did she bring it to his attention. She smokes approximately 1 pack cigarettes a day. Her social situation is concerning, she arrives this morning with her mother who appears extremely intoxicated/under the influence; her mother was asked to leave the room and be monitored by the patient's grandmother. The patient's aunt then accompanied the patient and the room throughout the rest of the appointment. We had a lengthy discussion regarding the deleterious effects of uncontrolled hyperglycemia and smoking as it relates to wound healing and overall health. She was strongly encouraged to decrease her smoking and get her diabetes under better control. She states she is currently on a diet and has cut down her [facility] consumption. The left toe is erythematous, macerated and slightly edematous with malodor present. The edema in her left foot is below her baseline, there is no erythema streaking. We will treat her with Santyl, doxycycline; we have ordered and xray, culture and provided a Peg assist surgical shoe and cultured the wound. 01/25/18-She is here in follow-up evaluation for a left great toe ulcer and presents with an abscess to her suprapubic area. She states her blood sugars remain elevated, feeling "sick" and if levels are below 250, but she is trying. She has made no attempt to decrease her smoking stating that we "can't take away her food in her cigarettes". She has been compliant with offloading using the PEG assist you. She is using Santyl daily. the culture obtained last week grew staph aureus and Enterococcus faecalis; continues on the doxycycline and Augmentin was added on Monday. The suprapubic area has erythema, no femoral variation, purple discoloration, minimal induration, was accessed with a cotton tip applicator with sanguinopurulent drainage, this was cultured, I suspect the DAYA, DUTT A ([id number]) [phone].pdf Page 2 of  20 current antibiotic treatment will cover and we  Type 2 diabetes mellitus with diabetic polyneuropathy Long term (current) use of insulin End stage renal disease Dependence on renal dialysis Corns and callosities Procedures Wound #12R Pre-procedure diagnosis of Wound #12R is a Diabetic Wound/Ulcer of the Lower Extremity located on the Left,Medial,Plantar Foot . There was a T Contact otal Cast Procedure by [name], PA-CMaryclare Bean, Carmin A ([id number]) [id number] Page 19 of 20 Post procedure Diagnosis Wound #12R: Same as Pre-Procedure Plan Follow-up Appointments: Return Appointment in 1 week. Bathing/ Shower/ Hygiene: May shower; gently cleanse wound with antibacterial soap, rinse and pat dry prior to dressing wounds No tub bath. Off-Loading: T Contact Cast to Left Lower Extremity otal Open toe surgical shoe - right foot WOUND #12R: - Foot Wound Laterality: Plantar, Left, Medial Cleanser: Vashe 5.8 (oz) Discharge Instructions: Use vashe 5.8 (oz) as directed Topical: Gentamicin Discharge Instructions: Apply as directed by  provider. Prim Dressing: Hydrofera Blue Ready Transfer Foam, 2.5x2.5 (in/in) ary Discharge Instructions: Apply Hydrofera Blue Ready to wound bed as directed Secondary Dressing: ABD Pad 5x9 (in/in) Discharge Instructions: Cover with ABD pad 1. I am going to recommend that we continue with a total contact cast which does seem to be doing a great job here. 2. Working to continue as well with the [employer]	[place] which I think is doing excellent. We will see patient back for reevaluation in 1 week here in the clinic. If anything worsens or changes patient will contact our office for additional recommendations. Electronic Signature(s) Signed: 08/07/2023 1:53:55 PM By: [name] PA-C Entered By: [name] on 08/07/2023 10:53:55 -------------------------------------------------------------------------------- Total Contact Cast Details Patient Name: Date of Service: TITILAYO, HAGANS 08/07/2023 12:45 PM Medical Record Number: [id number] Patient Account Number: [id number] Date of Birth/Sex: Treating RN: 1976-01-15 (47 y.o. [name] Primary Care Provider: [name] Other Clinician: [name] Referring Provider: Treating Provider/Extender: [name] Weeks in Treatment: 72 T Contact Cast Applied for Wound Assessment: otal Wound #12R Left,Medial,Plantar Foot Performed By: Physician [name], PA-C Post Procedure Diagnosis Same as Pre-procedure Electronic Signature(s) Signed: 08/07/2023 4:48:00 PM By: [name] PA-C Signed: 08/08/2023 4:35:53 PM By: [name] Entered By: [name] on 08/07/2023 10:21:52 Earmon Phoenix A ([id number]) [id number] Page 20 of 20 -------------------------------------------------------------------------------- SuperBill Details Patient Name: Date of Service: AVICE, FUNCHESS 08/07/2023 Medical Record Number: [id number] Patient Account Number: [id number] Date of Birth/Sex: Treating  RN: 08/09/1976 (47 y.o. 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.42 Type 2 diabetes mellitus with diabetic polyneuropathy Z79.4 Long term (current) use of insulin N18.6 End stage renal disease Z99.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 L84 Corns and callosities Facility Procedures : CPT4 Code: 32202542 Description: 29445 - APPLY TOTAL CONTACT LEG CAST ICD-10 Diagnosis Description 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 Modifier: ity Quantity: 1 Physician Procedures : CPT4 Code Description Modifier [id number] - WC PHYS APPLY TOTAL CONTACT CAST ICD-10 Diagnosis Description 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ity Quantity: 1 Electronic Signature(s) Signed: 08/07/2023 1:54:31 PM By: [name] PA-C Previous Signature: 08/07/2023 1:54:08 PM Version By: [name] PA-C Entered By: [name] on 08/07/2023 10:54:31  Discharge Instructions: Cover with ABD pad TANAIA, HAWKEY A ([id number]) [id number] Page 10 of 20 Electronic Signature(s) Signed: 08/07/2023 4:48:00 PM By: [name] PA-C Signed: 08/08/2023 4:35:53 PM By: [name] Entered By: [name] on 08/07/2023 10:22:27 -------------------------------------------------------------------------------- Problem List Details Patient Name: Date of Service: [name] 08/07/2023 12:45 PM Medical Record Number: [id number] Patient Account Number: [id number] Date of Birth/Sex: Treating RN: Sep 03, 1976 (47 y.o. 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 03/16/2022 No Yes severity E11.42 Type 2 diabetes mellitus with diabetic polyneuropathy 03/16/2022 No Yes Z79.4 Long term (current) use of insulin 02/22/2023 No Yes N18.6 End stage renal disease 07/14/2023 No Yes Z99.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 07/14/2023 No Yes L84 Corns and callosities 08/01/2023 No Yes Inactive Problems Resolved Problems ICD-10 Code  Description Active Date Resolved Date L97.512 Non-pressure chronic ulcer of other part of right foot with fat layer exposed 03/16/2022 03/16/2022 L97.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 of skin 03/16/2022 03/16/2022 ASYIA, HORNUNG A ([id number]) [id number].pdf Page 11 of 20 W54.0XXA Bitten by dog, initial encounter 05/17/2023 05/17/2023 S81.801A Unspecified open wound, right lower leg, initial encounter 05/17/2023 05/17/2023 Q03.474 Type 2 diabetes mellitus with other skin ulcer 05/17/2023 05/17/2023 Electronic Signature(s) Signed: 08/07/2023 12:44:43 PM By: [name] PA-C Entered By: [name] on 08/07/2023 09:44:43 -------------------------------------------------------------------------------- Progress Note Details Patient Name: Date of Service: [name] 08/07/2023 12:45 PM Medical Record Number: [id number] Patient Account Number: [id number] Date of Birth/Sex: Treating RN: [birth year] (47 y.o. [name] Primary Care Provider: [name] Other Clinician: [name] Referring Provider: Treating Provider/Extender: [name] in Treatment: 72 Subjective Chief Complaint Information obtained from Patient Left foot ulcer History of Present Illness (HPI) 01/18/18-She is here for initial evaluation of the left great toe ulcer. She is a poor historian in regards to timeframe in detail. She states approximately 4 weeks ago she lacerated her toe on something in the house. She followed up with her primary care who placed her on Bactrim and ultimately a second dose of Bactrim prior to coming to wound clinic. She states she has been treating the toe with peroxide, Betadine and a Band-Aid. She did not check her blood sugar this morning but checked it yesterday morning it was 327; she is unaware of a recent A1c and there are no current records. She saw Dr. she would've orthopedics last week  for an old injury to the left ankle, she states he did not see her toe, nor did she bring it to his attention. She smokes approximately 1 pack cigarettes a day. Her social situation is concerning, she arrives this morning with her mother who appears extremely intoxicated/under the influence; her mother was asked to leave the room and be monitored by the patient's grandmother. The patient's aunt then accompanied the patient and the room throughout the rest of the appointment. We had a lengthy discussion regarding the deleterious effects of uncontrolled hyperglycemia and smoking as it relates to wound healing and overall health. She was strongly encouraged to decrease her smoking and get her diabetes under better control. She states she is currently on a diet and has cut down her [facility]  LAMIYAH, SCHLOTTER A ([id number]) [id number] Page 1 of 20 Visit Report for 08/07/2023 Chief Complaint Document Details Patient Name: Date of Service: ODYSSEY, VASBINDER 08/07/2023 12:45 PM Medical Record Number: [id number] Patient Account Number: [id number] Date of Birth/Sex: Treating RN: 24-Apr-1976 (47 y.o. [name] Primary Care Provider: [name] Other Clinician: [name] Referring Provider: Treating Provider/Extender: [name] Weeks in Treatment: 72 Information Obtained from: Patient Chief Complaint Left foot ulcer Electronic Signature(s) Signed: 08/07/2023 12:44:47 PM By: [name] PA-C Entered By: [name] on 08/07/2023 09:44:47 -------------------------------------------------------------------------------- HPI Details Patient Name: Date of Service: [name] 08/07/2023 12:45 PM Medical Record Number: [id number] Patient Account Number: [id number] Date of Birth/Sex: Treating RN: Mar 24, 1976 (47 y.o. [name] Primary Care Provider: [name] Other Clinician: [name] Referring Provider: Treating Provider/Extender: [name] in Treatment: 72 History of Present Illness HPI Description: 01/18/18-She is here for initial evaluation of the left great toe ulcer. She is a poor historian in regards to timeframe in detail. She states approximately 4 weeks ago she lacerated her toe on something in the house. She followed up with her primary care who placed her on Bactrim and ultimately a second dose of Bactrim prior to coming to wound clinic. She states she has been treating the toe with peroxide, Betadine and a Band-Aid. She did not check her blood sugar this morning but checked it yesterday morning it was 327; she is unaware of a recent A1c and there are no current records. She saw Dr. she would've orthopedics last week for an old injury to the left ankle, she states he did  not see her toe, nor did she bring it to his attention. She smokes approximately 1 pack cigarettes a day. Her social situation is concerning, she arrives this morning with her mother who appears extremely intoxicated/under the influence; her mother was asked to leave the room and be monitored by the patient's grandmother. The patient's aunt then accompanied the patient and the room throughout the rest of the appointment. We had a lengthy discussion regarding the deleterious effects of uncontrolled hyperglycemia and smoking as it relates to wound healing and overall health. She was strongly encouraged to decrease her smoking and get her diabetes under better control. She states she is currently on a diet and has cut down her [facility] consumption. The left toe is erythematous, macerated and slightly edematous with malodor present. The edema in her left foot is below her baseline, there is no erythema streaking. We will treat her with Santyl, doxycycline; we have ordered and xray, culture and provided a Peg assist surgical shoe and cultured the wound. 01/25/18-She is here in follow-up evaluation for a left great toe ulcer and presents with an abscess to her suprapubic area. She states her blood sugars remain elevated, feeling "sick" and if levels are below 250, but she is trying. She has made no attempt to decrease her smoking stating that we "can't take away her food in her cigarettes". She has been compliant with offloading using the PEG assist you. She is using Santyl daily. the culture obtained last week grew staph aureus and Enterococcus faecalis; continues on the doxycycline and Augmentin was added on Monday. The suprapubic area has erythema, no femoral variation, purple discoloration, minimal induration, was accessed with a cotton tip applicator with sanguinopurulent drainage, this was cultured, I suspect the DAYA, DUTT A ([id number]) [phone].pdf Page 2 of  20 current antibiotic treatment will cover and we  this is much better than last week no debridement even necessary today. We may have to perform some slight debridement come next week we will see how things go. Monitor for any signs of worsening overall and obviously if anything changes she should be letting us know. 08-01-2023 upon evaluation today patient appears to be doing excellent in regard to her feet. The right foot/heel area is completely healed she does have some callus buildup I am going to trim this down a little bit I do not want to build up to the point of cracking she is doing well with the urea cream. With regard to the left heel she is doing well with casting I am going to perform some debridement here to clear away before reapplying the cast and I think she is really doing quite well though. 08-07-2023 upon evaluation patient's wound actually showed signs of excellent improvement and actually very pleased with where we stand I do believe that she is making good headway towards complete closure which is great news. Electronic Signature(s) Signed: 08/07/2023 1:53:19 PM By: [name] PA-C Previous Signature: 08/07/2023 1:18:48 PM Version By: [name] PA-C Entered By: [name] on 08/07/2023 10:53:19 -------------------------------------------------------------------------------- Physical Exam Details Patient Name: Date of Service: KIDA, DIGIULIO 08/07/2023 12:45 PM Medical Record Number: [id number] Patient Account Number: [id number] Date of Birth/Sex: Treating RN: 08-19-1976 (47 y.o. [name] Primary Care Provider: [name] Other Clinician: [name] Referring Provider: Treating  Provider/Extender: [name] Weeks in Treatment: 24 Constitutional Well-nourished and well-hydrated in no acute distress. Respiratory normal breathing without difficulty. CANDANCE, BOHLMAN A ([id number]) [id number] Page 9 of 20 Psychiatric this patient is able to make decisions and demonstrates good insight into disease process. Alert and Oriented x 3. pleasant and cooperative. Notes Patient's wound bed showed signs of good granulation and epithelization at this point. Fortunately I think that she is doing well with a total contact casting working to continue as such with that and I did go ahead and reapply that here in the office today. Electronic Signature(s) Signed: 08/07/2023 1:53:36 PM By: [name] PA-C Entered By: [name] on 08/07/2023 10:53:36 -------------------------------------------------------------------------------- Physician Orders Details Patient Name: Date of Service: [name] 08/07/2023 12:45 PM Medical Record Number: [id number] Patient Account Number: [id number] Date of Birth/Sex: Treating RN: 02/05/1976 (47 y.o. 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 Bathing/ Shower/ Hygiene May shower; gently cleanse wound with antibacterial soap, rinse and pat dry  prior to dressing wounds No tub bath. Off-Loading Total Contact Cast to Left Lower Extremity Open toe surgical shoe - right foot Wound Treatment Wound #12R - Foot Wound Laterality: Plantar, Left, Medial Cleanser: Vashe 5.8 (oz) Discharge Instructions: Use vashe 5.8 (oz) as directed Topical: Gentamicin Discharge Instructions: Apply as directed by provider. Prim Dressing: Hydrofera Blue Ready Transfer Foam, 2.5x2.5 (in/in) ary Discharge Instructions: Apply Hydrofera Blue Ready to wound bed as directed Secondary Dressing: ABD Pad 5x9 (in/in)  Type 2 diabetes mellitus with diabetic polyneuropathy Long term (current) use of insulin End stage renal disease Dependence on renal dialysis Corns and callosities Procedures Wound #12R Pre-procedure diagnosis of Wound #12R is a Diabetic Wound/Ulcer of the Lower Extremity located on the Left,Medial,Plantar Foot . There was a T Contact otal Cast Procedure by [name], PA-CMaryclare Bean, Carmin A ([id number]) [id number] Page 19 of 20 Post procedure Diagnosis Wound #12R: Same as Pre-Procedure Plan Follow-up Appointments: Return Appointment in 1 week. Bathing/ Shower/ Hygiene: May shower; gently cleanse wound with antibacterial soap, rinse and pat dry prior to dressing wounds No tub bath. Off-Loading: T Contact Cast to Left Lower Extremity otal Open toe surgical shoe - right foot WOUND #12R: - Foot Wound Laterality: Plantar, Left, Medial Cleanser: Vashe 5.8 (oz) Discharge Instructions: Use vashe 5.8 (oz) as directed Topical: Gentamicin Discharge Instructions: Apply as directed by  provider. Prim Dressing: Hydrofera Blue Ready Transfer Foam, 2.5x2.5 (in/in) ary Discharge Instructions: Apply Hydrofera Blue Ready to wound bed as directed Secondary Dressing: ABD Pad 5x9 (in/in) Discharge Instructions: Cover with ABD pad 1. I am going to recommend that we continue with a total contact cast which does seem to be doing a great job here. 2. Working to continue as well with the [employer]	[place] which I think is doing excellent. We will see patient back for reevaluation in 1 week here in the clinic. If anything worsens or changes patient will contact our office for additional recommendations. Electronic Signature(s) Signed: 08/07/2023 1:53:55 PM By: [name] PA-C Entered By: [name] on 08/07/2023 10:53:55 -------------------------------------------------------------------------------- Total Contact Cast Details Patient Name: Date of Service: TITILAYO, HAGANS 08/07/2023 12:45 PM Medical Record Number: [id number] Patient Account Number: [id number] Date of Birth/Sex: Treating RN: 1976-01-15 (47 y.o. [name] Primary Care Provider: [name] Other Clinician: [name] Referring Provider: Treating Provider/Extender: [name] Weeks in Treatment: 72 T Contact Cast Applied for Wound Assessment: otal Wound #12R Left,Medial,Plantar Foot Performed By: Physician [name], PA-C Post Procedure Diagnosis Same as Pre-procedure Electronic Signature(s) Signed: 08/07/2023 4:48:00 PM By: [name] PA-C Signed: 08/08/2023 4:35:53 PM By: [name] Entered By: [name] on 08/07/2023 10:21:52 Earmon Phoenix A ([id number]) [id number] Page 20 of 20 -------------------------------------------------------------------------------- SuperBill Details Patient Name: Date of Service: AVICE, FUNCHESS 08/07/2023 Medical Record Number: [id number] Patient Account Number: [id number] Date of Birth/Sex: Treating  RN: 08/09/1976 (47 y.o. 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.42 Type 2 diabetes mellitus with diabetic polyneuropathy Z79.4 Long term (current) use of insulin N18.6 End stage renal disease Z99.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 L84 Corns and callosities Facility Procedures : CPT4 Code: 32202542 Description: 29445 - APPLY TOTAL CONTACT LEG CAST ICD-10 Diagnosis Description 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 Modifier: ity Quantity: 1 Physician Procedures : CPT4 Code Description Modifier [id number] - WC PHYS APPLY TOTAL CONTACT CAST ICD-10 Diagnosis Description 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ity Quantity: 1 Electronic Signature(s) Signed: 08/07/2023 1:54:31 PM By: [name] PA-C Previous Signature: 08/07/2023 1:54:08 PM Version By: [name] PA-C Entered By: [name] on 08/07/2023 10:54:31  Type 2 diabetes mellitus with diabetic polyneuropathy Long term (current) use of insulin End stage renal disease Dependence on renal dialysis Corns and callosities Procedures Wound #12R Pre-procedure diagnosis of Wound #12R is a Diabetic Wound/Ulcer of the Lower Extremity located on the Left,Medial,Plantar Foot . There was a T Contact otal Cast Procedure by [name], PA-CMaryclare Bean, Carmin A ([id number]) [id number] Page 19 of 20 Post procedure Diagnosis Wound #12R: Same as Pre-Procedure Plan Follow-up Appointments: Return Appointment in 1 week. Bathing/ Shower/ Hygiene: May shower; gently cleanse wound with antibacterial soap, rinse and pat dry prior to dressing wounds No tub bath. Off-Loading: T Contact Cast to Left Lower Extremity otal Open toe surgical shoe - right foot WOUND #12R: - Foot Wound Laterality: Plantar, Left, Medial Cleanser: Vashe 5.8 (oz) Discharge Instructions: Use vashe 5.8 (oz) as directed Topical: Gentamicin Discharge Instructions: Apply as directed by  provider. Prim Dressing: Hydrofera Blue Ready Transfer Foam, 2.5x2.5 (in/in) ary Discharge Instructions: Apply Hydrofera Blue Ready to wound bed as directed Secondary Dressing: ABD Pad 5x9 (in/in) Discharge Instructions: Cover with ABD pad 1. I am going to recommend that we continue with a total contact cast which does seem to be doing a great job here. 2. Working to continue as well with the [employer]	[place] which I think is doing excellent. We will see patient back for reevaluation in 1 week here in the clinic. If anything worsens or changes patient will contact our office for additional recommendations. Electronic Signature(s) Signed: 08/07/2023 1:53:55 PM By: [name] PA-C Entered By: [name] on 08/07/2023 10:53:55 -------------------------------------------------------------------------------- Total Contact Cast Details Patient Name: Date of Service: TITILAYO, HAGANS 08/07/2023 12:45 PM Medical Record Number: [id number] Patient Account Number: [id number] Date of Birth/Sex: Treating RN: 1976-01-15 (47 y.o. [name] Primary Care Provider: [name] Other Clinician: [name] Referring Provider: Treating Provider/Extender: [name] Weeks in Treatment: 72 T Contact Cast Applied for Wound Assessment: otal Wound #12R Left,Medial,Plantar Foot Performed By: Physician [name], PA-C Post Procedure Diagnosis Same as Pre-procedure Electronic Signature(s) Signed: 08/07/2023 4:48:00 PM By: [name] PA-C Signed: 08/08/2023 4:35:53 PM By: [name] Entered By: [name] on 08/07/2023 10:21:52 Earmon Phoenix A ([id number]) [id number] Page 20 of 20 -------------------------------------------------------------------------------- SuperBill Details Patient Name: Date of Service: AVICE, FUNCHESS 08/07/2023 Medical Record Number: [id number] Patient Account Number: [id number] Date of Birth/Sex: Treating  RN: 08/09/1976 (47 y.o. 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.42 Type 2 diabetes mellitus with diabetic polyneuropathy Z79.4 Long term (current) use of insulin N18.6 End stage renal disease Z99.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 L84 Corns and callosities Facility Procedures : CPT4 Code: 32202542 Description: 29445 - APPLY TOTAL CONTACT LEG CAST ICD-10 Diagnosis Description 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 Modifier: ity Quantity: 1 Physician Procedures : CPT4 Code Description Modifier [id number] - WC PHYS APPLY TOTAL CONTACT CAST ICD-10 Diagnosis Description 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ity Quantity: 1 Electronic Signature(s) Signed: 08/07/2023 1:54:31 PM By: [name] PA-C Previous Signature: 08/07/2023 1:54:08 PM Version By: [name] PA-C Entered By: [name] on 08/07/2023 10:54:31  Discharge Instructions: Cover with ABD pad TANAIA, HAWKEY A ([id number]) [id number] Page 10 of 20 Electronic Signature(s) Signed: 08/07/2023 4:48:00 PM By: [name] PA-C Signed: 08/08/2023 4:35:53 PM By: [name] Entered By: [name] on 08/07/2023 10:22:27 -------------------------------------------------------------------------------- Problem List Details Patient Name: Date of Service: [name] 08/07/2023 12:45 PM Medical Record Number: [id number] Patient Account Number: [id number] Date of Birth/Sex: Treating RN: Sep 03, 1976 (47 y.o. 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 03/16/2022 No Yes severity E11.42 Type 2 diabetes mellitus with diabetic polyneuropathy 03/16/2022 No Yes Z79.4 Long term (current) use of insulin 02/22/2023 No Yes N18.6 End stage renal disease 07/14/2023 No Yes Z99.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 07/14/2023 No Yes L84 Corns and callosities 08/01/2023 No Yes Inactive Problems Resolved Problems ICD-10 Code  Description Active Date Resolved Date L97.512 Non-pressure chronic ulcer of other part of right foot with fat layer exposed 03/16/2022 03/16/2022 L97.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 of skin 03/16/2022 03/16/2022 ASYIA, HORNUNG A ([id number]) [id number].pdf Page 11 of 20 W54.0XXA Bitten by dog, initial encounter 05/17/2023 05/17/2023 S81.801A Unspecified open wound, right lower leg, initial encounter 05/17/2023 05/17/2023 Q03.474 Type 2 diabetes mellitus with other skin ulcer 05/17/2023 05/17/2023 Electronic Signature(s) Signed: 08/07/2023 12:44:43 PM By: [name] PA-C Entered By: [name] on 08/07/2023 09:44:43 -------------------------------------------------------------------------------- Progress Note Details Patient Name: Date of Service: [name] 08/07/2023 12:45 PM Medical Record Number: [id number] Patient Account Number: [id number] Date of Birth/Sex: Treating RN: [birth year] (47 y.o. [name] Primary Care Provider: [name] Other Clinician: [name] Referring Provider: Treating Provider/Extender: [name] in Treatment: 72 Subjective Chief Complaint Information obtained from Patient Left foot ulcer History of Present Illness (HPI) 01/18/18-She is here for initial evaluation of the left great toe ulcer. She is a poor historian in regards to timeframe in detail. She states approximately 4 weeks ago she lacerated her toe on something in the house. She followed up with her primary care who placed her on Bactrim and ultimately a second dose of Bactrim prior to coming to wound clinic. She states she has been treating the toe with peroxide, Betadine and a Band-Aid. She did not check her blood sugar this morning but checked it yesterday morning it was 327; she is unaware of a recent A1c and there are no current records. She saw Dr. she would've orthopedics last week  for an old injury to the left ankle, she states he did not see her toe, nor did she bring it to his attention. She smokes approximately 1 pack cigarettes a day. Her social situation is concerning, she arrives this morning with her mother who appears extremely intoxicated/under the influence; her mother was asked to leave the room and be monitored by the patient's grandmother. The patient's aunt then accompanied the patient and the room throughout the rest of the appointment. We had a lengthy discussion regarding the deleterious effects of uncontrolled hyperglycemia and smoking as it relates to wound healing and overall health. She was strongly encouraged to decrease her smoking and get her diabetes under better control. She states she is currently on a diet and has cut down her [facility]  LAMIYAH, SCHLOTTER A ([id number]) [id number] Page 1 of 20 Visit Report for 08/07/2023 Chief Complaint Document Details Patient Name: Date of Service: ODYSSEY, VASBINDER 08/07/2023 12:45 PM Medical Record Number: [id number] Patient Account Number: [id number] Date of Birth/Sex: Treating RN: 24-Apr-1976 (47 y.o. [name] Primary Care Provider: [name] Other Clinician: [name] Referring Provider: Treating Provider/Extender: [name] Weeks in Treatment: 72 Information Obtained from: Patient Chief Complaint Left foot ulcer Electronic Signature(s) Signed: 08/07/2023 12:44:47 PM By: [name] PA-C Entered By: [name] on 08/07/2023 09:44:47 -------------------------------------------------------------------------------- HPI Details Patient Name: Date of Service: [name] 08/07/2023 12:45 PM Medical Record Number: [id number] Patient Account Number: [id number] Date of Birth/Sex: Treating RN: Mar 24, 1976 (47 y.o. [name] Primary Care Provider: [name] Other Clinician: [name] Referring Provider: Treating Provider/Extender: [name] in Treatment: 72 History of Present Illness HPI Description: 01/18/18-She is here for initial evaluation of the left great toe ulcer. She is a poor historian in regards to timeframe in detail. She states approximately 4 weeks ago she lacerated her toe on something in the house. She followed up with her primary care who placed her on Bactrim and ultimately a second dose of Bactrim prior to coming to wound clinic. She states she has been treating the toe with peroxide, Betadine and a Band-Aid. She did not check her blood sugar this morning but checked it yesterday morning it was 327; she is unaware of a recent A1c and there are no current records. She saw Dr. she would've orthopedics last week for an old injury to the left ankle, she states he did  not see her toe, nor did she bring it to his attention. She smokes approximately 1 pack cigarettes a day. Her social situation is concerning, she arrives this morning with her mother who appears extremely intoxicated/under the influence; her mother was asked to leave the room and be monitored by the patient's grandmother. The patient's aunt then accompanied the patient and the room throughout the rest of the appointment. We had a lengthy discussion regarding the deleterious effects of uncontrolled hyperglycemia and smoking as it relates to wound healing and overall health. She was strongly encouraged to decrease her smoking and get her diabetes under better control. She states she is currently on a diet and has cut down her [facility] consumption. The left toe is erythematous, macerated and slightly edematous with malodor present. The edema in her left foot is below her baseline, there is no erythema streaking. We will treat her with Santyl, doxycycline; we have ordered and xray, culture and provided a Peg assist surgical shoe and cultured the wound. 01/25/18-She is here in follow-up evaluation for a left great toe ulcer and presents with an abscess to her suprapubic area. She states her blood sugars remain elevated, feeling "sick" and if levels are below 250, but she is trying. She has made no attempt to decrease her smoking stating that we "can't take away her food in her cigarettes". She has been compliant with offloading using the PEG assist you. She is using Santyl daily. the culture obtained last week grew staph aureus and Enterococcus faecalis; continues on the doxycycline and Augmentin was added on Monday. The suprapubic area has erythema, no femoral variation, purple discoloration, minimal induration, was accessed with a cotton tip applicator with sanguinopurulent drainage, this was cultured, I suspect the DAYA, DUTT A ([id number]) [id number].pdf Page 2 of  20 current antibiotic treatment will cover and we  Discharge Instructions: Cover with ABD pad TANAIA, HAWKEY A ([id number]) [id number] Page 10 of 20 Electronic Signature(s) Signed: 08/07/2023 4:48:00 PM By: [name] PA-C Signed: 08/08/2023 4:35:53 PM By: [name] Entered By: [name] on 08/07/2023 10:22:27 -------------------------------------------------------------------------------- Problem List Details Patient Name: Date of Service: [name] 08/07/2023 12:45 PM Medical Record Number: [id number] Patient Account Number: [id number] Date of Birth/Sex: Treating RN: Sep 03, 1976 (47 y.o. 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 03/16/2022 No Yes severity E11.42 Type 2 diabetes mellitus with diabetic polyneuropathy 03/16/2022 No Yes Z79.4 Long term (current) use of insulin 02/22/2023 No Yes N18.6 End stage renal disease 07/14/2023 No Yes Z99.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 07/14/2023 No Yes L84 Corns and callosities 08/01/2023 No Yes Inactive Problems Resolved Problems ICD-10 Code  Description Active Date Resolved Date L97.512 Non-pressure chronic ulcer of other part of right foot with fat layer exposed 03/16/2022 03/16/2022 L97.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 of skin 03/16/2022 03/16/2022 ASYIA, HORNUNG A ([id number]) [id number] Page 11 of 20 W54.0XXA Bitten by dog, initial encounter 05/17/2023 05/17/2023 S81.801A Unspecified open wound, right lower leg, initial encounter 05/17/2023 05/17/2023 Q03.474 Type 2 diabetes mellitus with other skin ulcer 05/17/2023 05/17/2023 Electronic Signature(s) Signed: 08/07/2023 12:44:43 PM By: [name] PA-C Entered By: [name] on 08/07/2023 09:44:43 -------------------------------------------------------------------------------- Progress Note Details Patient Name: Date of Service: [name] 08/07/2023 12:45 PM Medical Record Number: [id number] Patient Account Number: [id number] Date of Birth/Sex: Treating RN: [birth year] (47 y.o. [name] Primary Care Provider: [name] Other Clinician: [name] Referring Provider: Treating Provider/Extender: [name] in Treatment: 72 Subjective Chief Complaint Information obtained from Patient Left foot ulcer History of Present Illness (HPI) 01/18/18-She is here for initial evaluation of the left great toe ulcer. She is a poor historian in regards to timeframe in detail. She states approximately 4 weeks ago she lacerated her toe on something in the house. She followed up with her primary care who placed her on Bactrim and ultimately a second dose of Bactrim prior to coming to wound clinic. She states she has been treating the toe with peroxide, Betadine and a Band-Aid. She did not check her blood sugar this morning but checked it yesterday morning it was 327; she is unaware of a recent A1c and there are no current records. She saw Dr. she would've orthopedics last week  for an old injury to the left ankle, she states he did not see her toe, nor did she bring it to his attention. She smokes approximately 1 pack cigarettes a day. Her social situation is concerning, she arrives this morning with her mother who appears extremely intoxicated/under the influence; her mother was asked to leave the room and be monitored by the patient's grandmother. The patient's aunt then accompanied the patient and the room throughout the rest of the appointment. We had a lengthy discussion regarding the deleterious effects of uncontrolled hyperglycemia and smoking as it relates to wound healing and overall health. She was strongly encouraged to decrease her smoking and get her diabetes under better control. She states she is currently on a diet and has cut down her [facility]  Type 2 diabetes mellitus with diabetic polyneuropathy Long term (current) use of insulin End stage renal disease Dependence on renal dialysis Corns and callosities Procedures Wound #12R Pre-procedure diagnosis of Wound #12R is a Diabetic Wound/Ulcer of the Lower Extremity located on the Left,Medial,Plantar Foot . There was a T Contact otal Cast Procedure by [name], PA-CMaryclare Bean, Carmin A ([id number]) [id number] Page 19 of 20 Post procedure Diagnosis Wound #12R: Same as Pre-Procedure Plan Follow-up Appointments: Return Appointment in 1 week. Bathing/ Shower/ Hygiene: May shower; gently cleanse wound with antibacterial soap, rinse and pat dry prior to dressing wounds No tub bath. Off-Loading: T Contact Cast to Left Lower Extremity otal Open toe surgical shoe - right foot WOUND #12R: - Foot Wound Laterality: Plantar, Left, Medial Cleanser: Vashe 5.8 (oz) Discharge Instructions: Use vashe 5.8 (oz) as directed Topical: Gentamicin Discharge Instructions: Apply as directed by  provider. Prim Dressing: Hydrofera Blue Ready Transfer Foam, 2.5x2.5 (in/in) ary Discharge Instructions: Apply Hydrofera Blue Ready to wound bed as directed Secondary Dressing: ABD Pad 5x9 (in/in) Discharge Instructions: Cover with ABD pad 1. I am going to recommend that we continue with a total contact cast which does seem to be doing a great job here. 2. Working to continue as well with the [employer]	[place] which I think is doing excellent. We will see patient back for reevaluation in 1 week here in the clinic. If anything worsens or changes patient will contact our office for additional recommendations. Electronic Signature(s) Signed: 08/07/2023 1:53:55 PM By: [name] PA-C Entered By: [name] on 08/07/2023 10:53:55 -------------------------------------------------------------------------------- Total Contact Cast Details Patient Name: Date of Service: TITILAYO, HAGANS 08/07/2023 12:45 PM Medical Record Number: [id number] Patient Account Number: [id number] Date of Birth/Sex: Treating RN: 1976-01-15 (47 y.o. [name] Primary Care Provider: [name] Other Clinician: [name] Referring Provider: Treating Provider/Extender: [name] Weeks in Treatment: 72 T Contact Cast Applied for Wound Assessment: otal Wound #12R Left,Medial,Plantar Foot Performed By: Physician [name], PA-C Post Procedure Diagnosis Same as Pre-procedure Electronic Signature(s) Signed: 08/07/2023 4:48:00 PM By: [name] PA-C Signed: 08/08/2023 4:35:53 PM By: [name] Entered By: [name] on 08/07/2023 10:21:52 Earmon Phoenix A ([id number]) [id number] Page 20 of 20 -------------------------------------------------------------------------------- SuperBill Details Patient Name: Date of Service: AVICE, FUNCHESS 08/07/2023 Medical Record Number: [id number] Patient Account Number: [id number] Date of Birth/Sex: Treating  RN: 08/09/1976 (47 y.o. 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.42 Type 2 diabetes mellitus with diabetic polyneuropathy Z79.4 Long term (current) use of insulin N18.6 End stage renal disease Z99.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 L84 Corns and callosities Facility Procedures : CPT4 Code: [id number] Description: 29445 - APPLY TOTAL CONTACT LEG CAST ICD-10 Diagnosis Description 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 Modifier: ity Quantity: 1 Physician Procedures : CPT4 Code Description Modifier [id number] - WC PHYS APPLY TOTAL CONTACT CAST ICD-10 Diagnosis Description 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ity Quantity: 1 Electronic Signature(s) Signed: 08/07/2023 1:54:31 PM By: [name] PA-C Previous Signature: 08/07/2023 1:54:08 PM Version By: [name] PA-C Entered By: [name] on 08/07/2023 10:54:31  LAMIYAH, SCHLOTTER A ([id number]) [id number] Page 1 of 20 Visit Report for 08/07/2023 Chief Complaint Document Details Patient Name: Date of Service: ODYSSEY, VASBINDER 08/07/2023 12:45 PM Medical Record Number: [id number] Patient Account Number: [id number] Date of Birth/Sex: Treating RN: 24-Apr-1976 (47 y.o. [name] Primary Care Provider: [name] Other Clinician: [name] Referring Provider: Treating Provider/Extender: [name] Weeks in Treatment: 72 Information Obtained from: Patient Chief Complaint Left foot ulcer Electronic Signature(s) Signed: 08/07/2023 12:44:47 PM By: [name] PA-C Entered By: [name] on 08/07/2023 09:44:47 -------------------------------------------------------------------------------- HPI Details Patient Name: Date of Service: [name] 08/07/2023 12:45 PM Medical Record Number: [id number] Patient Account Number: [id number] Date of Birth/Sex: Treating RN: Mar 24, 1976 (47 y.o. [name] Primary Care Provider: [name] Other Clinician: [name] Referring Provider: Treating Provider/Extender: [name] in Treatment: 72 History of Present Illness HPI Description: 01/18/18-She is here for initial evaluation of the left great toe ulcer. She is a poor historian in regards to timeframe in detail. She states approximately 4 weeks ago she lacerated her toe on something in the house. She followed up with her primary care who placed her on Bactrim and ultimately a second dose of Bactrim prior to coming to wound clinic. She states she has been treating the toe with peroxide, Betadine and a Band-Aid. She did not check her blood sugar this morning but checked it yesterday morning it was 327; she is unaware of a recent A1c and there are no current records. She saw Dr. she would've orthopedics last week for an old injury to the left ankle, she states he did  not see her toe, nor did she bring it to his attention. She smokes approximately 1 pack cigarettes a day. Her social situation is concerning, she arrives this morning with her mother who appears extremely intoxicated/under the influence; her mother was asked to leave the room and be monitored by the patient's grandmother. The patient's aunt then accompanied the patient and the room throughout the rest of the appointment. We had a lengthy discussion regarding the deleterious effects of uncontrolled hyperglycemia and smoking as it relates to wound healing and overall health. She was strongly encouraged to decrease her smoking and get her diabetes under better control. She states she is currently on a diet and has cut down her [facility] consumption. The left toe is erythematous, macerated and slightly edematous with malodor present. The edema in her left foot is below her baseline, there is no erythema streaking. We will treat her with Santyl, doxycycline; we have ordered and xray, culture and provided a Peg assist surgical shoe and cultured the wound. 01/25/18-She is here in follow-up evaluation for a left great toe ulcer and presents with an abscess to her suprapubic area. She states her blood sugars remain elevated, feeling "sick" and if levels are below 250, but she is trying. She has made no attempt to decrease her smoking stating that we "can't take away her food in her cigarettes". She has been compliant with offloading using the PEG assist you. She is using Santyl daily. the culture obtained last week grew staph aureus and Enterococcus faecalis; continues on the doxycycline and Augmentin was added on Monday. The suprapubic area has erythema, no femoral variation, purple discoloration, minimal induration, was accessed with a cotton tip applicator with sanguinopurulent drainage, this was cultured, I suspect the DAYA, DUTT A ([id number]) [phone].pdf Page 2 of  20 current antibiotic treatment will cover and we  this is much better than last week no debridement even necessary today. We may have to perform some slight debridement come next week we will see how things go. Monitor for any signs of worsening overall and obviously if anything changes she should be letting us know. 08-01-2023 upon evaluation today patient appears to be doing excellent in regard to her feet. The right foot/heel area is completely healed she does have some callus buildup I am going to trim this down a little bit I do not want to build up to the point of cracking she is doing well with the urea cream. With regard to the left heel she is doing well with casting I am going to perform some debridement here to clear away before reapplying the cast and I think she is really doing quite well though. 08-07-2023 upon evaluation patient's wound actually showed signs of excellent improvement and actually very pleased with where we stand I do believe that she is making good headway towards complete closure which is great news. Electronic Signature(s) Signed: 08/07/2023 1:53:19 PM By: [name] PA-C Previous Signature: 08/07/2023 1:18:48 PM Version By: [name] PA-C Entered By: [name] on 08/07/2023 10:53:19 -------------------------------------------------------------------------------- Physical Exam Details Patient Name: Date of Service: KIDA, DIGIULIO 08/07/2023 12:45 PM Medical Record Number: [id number] Patient Account Number: [id number] Date of Birth/Sex: Treating RN: 08-19-1976 (47 y.o. [name] Primary Care Provider: [name] Other Clinician: [name] Referring Provider: Treating  Provider/Extender: [name] Weeks in Treatment: 24 Constitutional Well-nourished and well-hydrated in no acute distress. Respiratory normal breathing without difficulty. CANDANCE, BOHLMAN A ([id number]) [id number] Page 9 of 20 Psychiatric this patient is able to make decisions and demonstrates good insight into disease process. Alert and Oriented x 3. pleasant and cooperative. Notes Patient's wound bed showed signs of good granulation and epithelization at this point. Fortunately I think that she is doing well with a total contact casting working to continue as such with that and I did go ahead and reapply that here in the office today. Electronic Signature(s) Signed: 08/07/2023 1:53:36 PM By: [name] PA-C Entered By: [name] on 08/07/2023 10:53:36 -------------------------------------------------------------------------------- Physician Orders Details Patient Name: Date of Service: [name] 08/07/2023 12:45 PM Medical Record Number: [id number] Patient Account Number: [id number] Date of Birth/Sex: Treating RN: 02/05/1976 (47 y.o. 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 Bathing/ Shower/ Hygiene May shower; gently cleanse wound with antibacterial soap, rinse and pat dry  prior to dressing wounds No tub bath. Off-Loading Total Contact Cast to Left Lower Extremity Open toe surgical shoe - right foot Wound Treatment Wound #12R - Foot Wound Laterality: Plantar, Left, Medial Cleanser: Vashe 5.8 (oz) Discharge Instructions: Use vashe 5.8 (oz) as directed Topical: Gentamicin Discharge Instructions: Apply as directed by provider. Prim Dressing: Hydrofera Blue Ready Transfer Foam, 2.5x2.5 (in/in) ary Discharge Instructions: Apply Hydrofera Blue Ready to wound bed as directed Secondary Dressing: ABD Pad 5x9 (in/in)  Discharge Instructions: Cover with ABD pad TANAIA, HAWKEY A ([id number]) [id number] Page 10 of 20 Electronic Signature(s) Signed: 08/07/2023 4:48:00 PM By: [name] PA-C Signed: 08/08/2023 4:35:53 PM By: [name] Entered By: [name] on 08/07/2023 10:22:27 -------------------------------------------------------------------------------- Problem List Details Patient Name: Date of Service: [name] 08/07/2023 12:45 PM Medical Record Number: [id number] Patient Account Number: [id number] Date of Birth/Sex: Treating RN: Sep 03, 1976 (47 y.o. 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 03/16/2022 No Yes severity E11.42 Type 2 diabetes mellitus with diabetic polyneuropathy 03/16/2022 No Yes Z79.4 Long term (current) use of insulin 02/22/2023 No Yes N18.6 End stage renal disease 07/14/2023 No Yes Z99.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 07/14/2023 No Yes L84 Corns and callosities 08/01/2023 No Yes Inactive Problems Resolved Problems ICD-10 Code  Description Active Date Resolved Date L97.512 Non-pressure chronic ulcer of other part of right foot with fat layer exposed 03/16/2022 03/16/2022 L97.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 of skin 03/16/2022 03/16/2022 ASYIA, HORNUNG A ([id number]) [id number] Page 11 of 20 W54.0XXA Bitten by dog, initial encounter 05/17/2023 05/17/2023 S81.801A Unspecified open wound, right lower leg, initial encounter 05/17/2023 05/17/2023 Q03.474 Type 2 diabetes mellitus with other skin ulcer 05/17/2023 05/17/2023 Electronic Signature(s) Signed: 08/07/2023 12:44:43 PM By: [name] PA-C Entered By: [name] on 08/07/2023 09:44:43 -------------------------------------------------------------------------------- Progress Note Details Patient Name: Date of Service: [name] 08/07/2023 12:45 PM Medical Record Number: [id number] Patient Account Number: [id number] Date of Birth/Sex: Treating RN: [birth year] (47 y.o. [name] Primary Care Provider: [name] Other Clinician: [name] Referring Provider: Treating Provider/Extender: [name] in Treatment: 72 Subjective Chief Complaint Information obtained from Patient Left foot ulcer History of Present Illness (HPI) 01/18/18-She is here for initial evaluation of the left great toe ulcer. She is a poor historian in regards to timeframe in detail. She states approximately 4 weeks ago she lacerated her toe on something in the house. She followed up with her primary care who placed her on Bactrim and ultimately a second dose of Bactrim prior to coming to wound clinic. She states she has been treating the toe with peroxide, Betadine and a Band-Aid. She did not check her blood sugar this morning but checked it yesterday morning it was 327; she is unaware of a recent A1c and there are no current records. She saw Dr. she would've orthopedics last week  for an old injury to the left ankle, she states he did not see her toe, nor did she bring it to his attention. She smokes approximately 1 pack cigarettes a day. Her social situation is concerning, she arrives this morning with her mother who appears extremely intoxicated/under the influence; her mother was asked to leave the room and be monitored by the patient's grandmother. The patient's aunt then accompanied the patient and the room throughout the rest of the appointment. We had a lengthy discussion regarding the deleterious effects of uncontrolled hyperglycemia and smoking as it relates to wound healing and overall health. She was strongly encouraged to decrease her smoking and get her diabetes under better control. She states she is currently on a diet and has cut down her [facility]  LAMIYAH, SCHLOTTER A ([id number]) [id number] Page 1 of 20 Visit Report for 08/07/2023 Chief Complaint Document Details Patient Name: Date of Service: ODYSSEY, VASBINDER 08/07/2023 12:45 PM Medical Record Number: [id number] Patient Account Number: [id number] Date of Birth/Sex: Treating RN: 24-Apr-1976 (47 y.o. [name] Primary Care Provider: [name] Other Clinician: [name] Referring Provider: Treating Provider/Extender: [name] Weeks in Treatment: 72 Information Obtained from: Patient Chief Complaint Left foot ulcer Electronic Signature(s) Signed: 08/07/2023 12:44:47 PM By: [name] PA-C Entered By: [name] on 08/07/2023 09:44:47 -------------------------------------------------------------------------------- HPI Details Patient Name: Date of Service: [name] 08/07/2023 12:45 PM Medical Record Number: [id number] Patient Account Number: [id number] Date of Birth/Sex: Treating RN: Mar 24, 1976 (47 y.o. [name] Primary Care Provider: [name] Other Clinician: [name] Referring Provider: Treating Provider/Extender: [name] in Treatment: 72 History of Present Illness HPI Description: 01/18/18-She is here for initial evaluation of the left great toe ulcer. She is a poor historian in regards to timeframe in detail. She states approximately 4 weeks ago she lacerated her toe on something in the house. She followed up with her primary care who placed her on Bactrim and ultimately a second dose of Bactrim prior to coming to wound clinic. She states she has been treating the toe with peroxide, Betadine and a Band-Aid. She did not check her blood sugar this morning but checked it yesterday morning it was 327; she is unaware of a recent A1c and there are no current records. She saw Dr. she would've orthopedics last week for an old injury to the left ankle, she states he did  not see her toe, nor did she bring it to his attention. She smokes approximately 1 pack cigarettes a day. Her social situation is concerning, she arrives this morning with her mother who appears extremely intoxicated/under the influence; her mother was asked to leave the room and be monitored by the patient's grandmother. The patient's aunt then accompanied the patient and the room throughout the rest of the appointment. We had a lengthy discussion regarding the deleterious effects of uncontrolled hyperglycemia and smoking as it relates to wound healing and overall health. She was strongly encouraged to decrease her smoking and get her diabetes under better control. She states she is currently on a diet and has cut down her [facility] consumption. The left toe is erythematous, macerated and slightly edematous with malodor present. The edema in her left foot is below her baseline, there is no erythema streaking. We will treat her with Santyl, doxycycline; we have ordered and xray, culture and provided a Peg assist surgical shoe and cultured the wound. 01/25/18-She is here in follow-up evaluation for a left great toe ulcer and presents with an abscess to her suprapubic area. She states her blood sugars remain elevated, feeling "sick" and if levels are below 250, but she is trying. She has made no attempt to decrease her smoking stating that we "can't take away her food in her cigarettes". She has been compliant with offloading using the PEG assist you. She is using Santyl daily. the culture obtained last week grew staph aureus and Enterococcus faecalis; continues on the doxycycline and Augmentin was added on Monday. The suprapubic area has erythema, no femoral variation, purple discoloration, minimal induration, was accessed with a cotton tip applicator with sanguinopurulent drainage, this was cultured, I suspect the DAYA, DUTT A ([id number]) [id number].pdf Page 2 of  20 current antibiotic treatment will cover and we  this is much better than last week no debridement even necessary today. We may have to perform some slight debridement come next week we will see how things go. Monitor for any signs of worsening overall and obviously if anything changes she should be letting us know. 08-01-2023 upon evaluation today patient appears to be doing excellent in regard to her feet. The right foot/heel area is completely healed she does have some callus buildup I am going to trim this down a little bit I do not want to build up to the point of cracking she is doing well with the urea cream. With regard to the left heel she is doing well with casting I am going to perform some debridement here to clear away before reapplying the cast and I think she is really doing quite well though. 08-07-2023 upon evaluation patient's wound actually showed signs of excellent improvement and actually very pleased with where we stand I do believe that she is making good headway towards complete closure which is great news. Electronic Signature(s) Signed: 08/07/2023 1:53:19 PM By: [name] PA-C Previous Signature: 08/07/2023 1:18:48 PM Version By: [name] PA-C Entered By: [name] on 08/07/2023 10:53:19 -------------------------------------------------------------------------------- Physical Exam Details Patient Name: Date of Service: KIDA, DIGIULIO 08/07/2023 12:45 PM Medical Record Number: [id number] Patient Account Number: [id number] Date of Birth/Sex: Treating RN: 08-19-1976 (47 y.o. [name] Primary Care Provider: [name] Other Clinician: [name] Referring Provider: Treating  Provider/Extender: [name] Weeks in Treatment: 24 Constitutional Well-nourished and well-hydrated in no acute distress. Respiratory normal breathing without difficulty. CANDANCE, BOHLMAN A ([id number]) [id number] Page 9 of 20 Psychiatric this patient is able to make decisions and demonstrates good insight into disease process. Alert and Oriented x 3. pleasant and cooperative. Notes Patient's wound bed showed signs of good granulation and epithelization at this point. Fortunately I think that she is doing well with a total contact casting working to continue as such with that and I did go ahead and reapply that here in the office today. Electronic Signature(s) Signed: 08/07/2023 1:53:36 PM By: [name] PA-C Entered By: [name] on 08/07/2023 10:53:36 -------------------------------------------------------------------------------- Physician Orders Details Patient Name: Date of Service: [name] 08/07/2023 12:45 PM Medical Record Number: [id number] Patient Account Number: [id number] Date of Birth/Sex: Treating RN: 02/05/1976 (47 y.o. 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 Bathing/ Shower/ Hygiene May shower; gently cleanse wound with antibacterial soap, rinse and pat dry  prior to dressing wounds No tub bath. Off-Loading Total Contact Cast to Left Lower Extremity Open toe surgical shoe - right foot Wound Treatment Wound #12R - Foot Wound Laterality: Plantar, Left, Medial Cleanser: Vashe 5.8 (oz) Discharge Instructions: Use vashe 5.8 (oz) as directed Topical: Gentamicin Discharge Instructions: Apply as directed by provider. Prim Dressing: Hydrofera Blue Ready Transfer Foam, 2.5x2.5 (in/in) ary Discharge Instructions: Apply Hydrofera Blue Ready to wound bed as directed Secondary Dressing: ABD Pad 5x9 (in/in)  Discharge Instructions: Cover with ABD pad TANAIA, HAWKEY A ([id number]) [id number] Page 10 of 20 Electronic Signature(s) Signed: 08/07/2023 4:48:00 PM By: [name] PA-C Signed: 08/08/2023 4:35:53 PM By: [name] Entered By: [name] on 08/07/2023 10:22:27 -------------------------------------------------------------------------------- Problem List Details Patient Name: Date of Service: [name] 08/07/2023 12:45 PM Medical Record Number: [id number] Patient Account Number: [id number] Date of Birth/Sex: Treating RN: Sep 03, 1976 (47 y.o. 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 03/16/2022 No Yes severity E11.42 Type 2 diabetes mellitus with diabetic polyneuropathy 03/16/2022 No Yes Z79.4 Long term (current) use of insulin 02/22/2023 No Yes N18.6 End stage renal disease 07/14/2023 No Yes Z99.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 07/14/2023 No Yes L84 Corns and callosities 08/01/2023 No Yes Inactive Problems Resolved Problems ICD-10 Code  Description Active Date Resolved Date L97.512 Non-pressure chronic ulcer of other part of right foot with fat layer exposed 03/16/2022 03/16/2022 L97.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 of skin 03/16/2022 03/16/2022 ASYIA, HORNUNG A ([id number]) [id number] Page 11 of 20 W54.0XXA Bitten by dog, initial encounter 05/17/2023 05/17/2023 S81.801A Unspecified open wound, right lower leg, initial encounter 05/17/2023 05/17/2023 Q03.474 Type 2 diabetes mellitus with other skin ulcer 05/17/2023 05/17/2023 Electronic Signature(s) Signed: 08/07/2023 12:44:43 PM By: [name] PA-C Entered By: [name] on 08/07/2023 09:44:43 -------------------------------------------------------------------------------- Progress Note Details Patient Name: Date of Service: [name] 08/07/2023 12:45 PM Medical Record Number: [id number] Patient Account Number: [id number] Date of Birth/Sex: Treating RN: [birth year] (47 y.o. [name] Primary Care Provider: [name] Other Clinician: [name] Referring Provider: Treating Provider/Extender: [name] in Treatment: 72 Subjective Chief Complaint Information obtained from Patient Left foot ulcer History of Present Illness (HPI) 01/18/18-She is here for initial evaluation of the left great toe ulcer. She is a poor historian in regards to timeframe in detail. She states approximately 4 weeks ago she lacerated her toe on something in the house. She followed up with her primary care who placed her on Bactrim and ultimately a second dose of Bactrim prior to coming to wound clinic. She states she has been treating the toe with peroxide, Betadine and a Band-Aid. She did not check her blood sugar this morning but checked it yesterday morning it was 327; she is unaware of a recent A1c and there are no current records. She saw Dr. she would've orthopedics last week  for an old injury to the left ankle, she states he did not see her toe, nor did she bring it to his attention. She smokes approximately 1 pack cigarettes a day. Her social situation is concerning, she arrives this morning with her mother who appears extremely intoxicated/under the influence; her mother was asked to leave the room and be monitored by the patient's grandmother. The patient's aunt then accompanied the patient and the room throughout the rest of the appointment. We had a lengthy discussion regarding the deleterious effects of uncontrolled hyperglycemia and smoking as it relates to wound healing and overall health. She was strongly encouraged to decrease her smoking and get her diabetes under better control. She states she is currently on a diet and has cut down her [facility]  Type 2 diabetes mellitus with diabetic polyneuropathy Long term (current) use of insulin End stage renal disease Dependence on renal dialysis Corns and callosities Procedures Wound #12R Pre-procedure diagnosis of Wound #12R is a Diabetic Wound/Ulcer of the Lower Extremity located on the Left,Medial,Plantar Foot . There was a T Contact otal Cast Procedure by [name], PA-CMaryclare Bean, Carmin A ([id number]) [id number] Page 19 of 20 Post procedure Diagnosis Wound #12R: Same as Pre-Procedure Plan Follow-up Appointments: Return Appointment in 1 week. Bathing/ Shower/ Hygiene: May shower; gently cleanse wound with antibacterial soap, rinse and pat dry prior to dressing wounds No tub bath. Off-Loading: T Contact Cast to Left Lower Extremity otal Open toe surgical shoe - right foot WOUND #12R: - Foot Wound Laterality: Plantar, Left, Medial Cleanser: Vashe 5.8 (oz) Discharge Instructions: Use vashe 5.8 (oz) as directed Topical: Gentamicin Discharge Instructions: Apply as directed by  provider. Prim Dressing: Hydrofera Blue Ready Transfer Foam, 2.5x2.5 (in/in) ary Discharge Instructions: Apply Hydrofera Blue Ready to wound bed as directed Secondary Dressing: ABD Pad 5x9 (in/in) Discharge Instructions: Cover with ABD pad 1. I am going to recommend that we continue with a total contact cast which does seem to be doing a great job here. 2. Working to continue as well with the [employer]	[place] which I think is doing excellent. We will see patient back for reevaluation in 1 week here in the clinic. If anything worsens or changes patient will contact our office for additional recommendations. Electronic Signature(s) Signed: 08/07/2023 1:53:55 PM By: [name] PA-C Entered By: [name] on 08/07/2023 10:53:55 -------------------------------------------------------------------------------- Total Contact Cast Details Patient Name: Date of Service: TITILAYO, HAGANS 08/07/2023 12:45 PM Medical Record Number: [id number] Patient Account Number: [id number] Date of Birth/Sex: Treating RN: 1976-01-15 (47 y.o. [name] Primary Care Provider: [name] Other Clinician: [name] Referring Provider: Treating Provider/Extender: [name] Weeks in Treatment: 72 T Contact Cast Applied for Wound Assessment: otal Wound #12R Left,Medial,Plantar Foot Performed By: Physician [name], PA-C Post Procedure Diagnosis Same as Pre-procedure Electronic Signature(s) Signed: 08/07/2023 4:48:00 PM By: [name] PA-C Signed: 08/08/2023 4:35:53 PM By: [name] Entered By: [name] on 08/07/2023 10:21:52 Earmon Phoenix A ([id number]) [id number] Page 20 of 20 -------------------------------------------------------------------------------- SuperBill Details Patient Name: Date of Service: AVICE, FUNCHESS 08/07/2023 Medical Record Number: [id number] Patient Account Number: [id number] Date of Birth/Sex: Treating  RN: 08/09/1976 (47 y.o. 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.42 Type 2 diabetes mellitus with diabetic polyneuropathy Z79.4 Long term (current) use of insulin N18.6 End stage renal disease Z99.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 L84 Corns and callosities Facility Procedures : CPT4 Code: 32202542 Description: 29445 - APPLY TOTAL CONTACT LEG CAST ICD-10 Diagnosis Description 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 Modifier: ity Quantity: 1 Physician Procedures : CPT4 Code Description Modifier [id number] - WC PHYS APPLY TOTAL CONTACT CAST ICD-10 Diagnosis Description 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ity Quantity: 1 Electronic Signature(s) Signed: 08/07/2023 1:54:31 PM By: [name] PA-C Previous Signature: 08/07/2023 1:54:08 PM Version By: [name] PA-C Entered By: [name] on 08/07/2023 10:54:31  Type 2 diabetes mellitus with diabetic polyneuropathy Long term (current) use of insulin End stage renal disease Dependence on renal dialysis Corns and callosities Procedures Wound #12R Pre-procedure diagnosis of Wound #12R is a Diabetic Wound/Ulcer of the Lower Extremity located on the Left,Medial,Plantar Foot . There was a T Contact otal Cast Procedure by [name], PA-CMaryclare Bean, Carmin A ([id number]) [id number] Page 19 of 20 Post procedure Diagnosis Wound #12R: Same as Pre-Procedure Plan Follow-up Appointments: Return Appointment in 1 week. Bathing/ Shower/ Hygiene: May shower; gently cleanse wound with antibacterial soap, rinse and pat dry prior to dressing wounds No tub bath. Off-Loading: T Contact Cast to Left Lower Extremity otal Open toe surgical shoe - right foot WOUND #12R: - Foot Wound Laterality: Plantar, Left, Medial Cleanser: Vashe 5.8 (oz) Discharge Instructions: Use vashe 5.8 (oz) as directed Topical: Gentamicin Discharge Instructions: Apply as directed by  provider. Prim Dressing: Hydrofera Blue Ready Transfer Foam, 2.5x2.5 (in/in) ary Discharge Instructions: Apply Hydrofera Blue Ready to wound bed as directed Secondary Dressing: ABD Pad 5x9 (in/in) Discharge Instructions: Cover with ABD pad 1. I am going to recommend that we continue with a total contact cast which does seem to be doing a great job here. 2. Working to continue as well with the [employer]	[place] which I think is doing excellent. We will see patient back for reevaluation in 1 week here in the clinic. If anything worsens or changes patient will contact our office for additional recommendations. Electronic Signature(s) Signed: 08/07/2023 1:53:55 PM By: [name] PA-C Entered By: [name] on 08/07/2023 10:53:55 -------------------------------------------------------------------------------- Total Contact Cast Details Patient Name: Date of Service: TITILAYO, HAGANS 08/07/2023 12:45 PM Medical Record Number: [id number] Patient Account Number: [id number] Date of Birth/Sex: Treating RN: 1976-01-15 (47 y.o. [name] Primary Care Provider: [name] Other Clinician: [name] Referring Provider: Treating Provider/Extender: [name] Weeks in Treatment: 72 T Contact Cast Applied for Wound Assessment: otal Wound #12R Left,Medial,Plantar Foot Performed By: Physician [name], PA-C Post Procedure Diagnosis Same as Pre-procedure Electronic Signature(s) Signed: 08/07/2023 4:48:00 PM By: [name] PA-C Signed: 08/08/2023 4:35:53 PM By: [name] Entered By: [name] on 08/07/2023 10:21:52 Earmon Phoenix A ([id number]) [id number] Page 20 of 20 -------------------------------------------------------------------------------- SuperBill Details Patient Name: Date of Service: AVICE, FUNCHESS 08/07/2023 Medical Record Number: [id number] Patient Account Number: [id number] Date of Birth/Sex: Treating  RN: 08/09/1976 (47 y.o. 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.42 Type 2 diabetes mellitus with diabetic polyneuropathy Z79.4 Long term (current) use of insulin N18.6 End stage renal disease Z99.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 L84 Corns and callosities Facility Procedures : CPT4 Code: 32202542 Description: 29445 - APPLY TOTAL CONTACT LEG CAST ICD-10 Diagnosis Description 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 Modifier: ity Quantity: 1 Physician Procedures : CPT4 Code Description Modifier [id number] 28315 - WC PHYS APPLY TOTAL CONTACT CAST ICD-10 Diagnosis Description 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ity Quantity: 1 Electronic Signature(s) Signed: 08/07/2023 1:54:31 PM By: [name] PA-C Previous Signature: 08/07/2023 1:54:08 PM Version By: [name] PA-C Entered By: [name] on 08/07/2023 10:54:31  Discharge Instructions: Cover with ABD pad TANAIA, HAWKEY A ([id number]) [id number] Page 10 of 20 Electronic Signature(s) Signed: 08/07/2023 4:48:00 PM By: [name] PA-C Signed: 08/08/2023 4:35:53 PM By: [name] Entered By: [name] on 08/07/2023 10:22:27 -------------------------------------------------------------------------------- Problem List Details Patient Name: Date of Service: [name] 08/07/2023 12:45 PM Medical Record Number: [id number] Patient Account Number: [id number] Date of Birth/Sex: Treating RN: Sep 03, 1976 (47 y.o. 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 03/16/2022 No Yes severity E11.42 Type 2 diabetes mellitus with diabetic polyneuropathy 03/16/2022 No Yes Z79.4 Long term (current) use of insulin 02/22/2023 No Yes N18.6 End stage renal disease 07/14/2023 No Yes Z99.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 07/14/2023 No Yes L84 Corns and callosities 08/01/2023 No Yes Inactive Problems Resolved Problems ICD-10 Code  Description Active Date Resolved Date L97.512 Non-pressure chronic ulcer of other part of right foot with fat layer exposed 03/16/2022 03/16/2022 L97.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 of skin 03/16/2022 03/16/2022 ASYIA, HORNUNG A ([id number]) [id number].pdf Page 11 of 20 W54.0XXA Bitten by dog, initial encounter 05/17/2023 05/17/2023 S81.801A Unspecified open wound, right lower leg, initial encounter 05/17/2023 05/17/2023 Q03.474 Type 2 diabetes mellitus with other skin ulcer 05/17/2023 05/17/2023 Electronic Signature(s) Signed: 08/07/2023 12:44:43 PM By: [name] PA-C Entered By: [name] on 08/07/2023 09:44:43 -------------------------------------------------------------------------------- Progress Note Details Patient Name: Date of Service: [name] 08/07/2023 12:45 PM Medical Record Number: [id number] Patient Account Number: [id number] Date of Birth/Sex: Treating RN: [birth year] (47 y.o. [name] Primary Care Provider: [name] Other Clinician: [name] Referring Provider: Treating Provider/Extender: [name] in Treatment: 72 Subjective Chief Complaint Information obtained from Patient Left foot ulcer History of Present Illness (HPI) 01/18/18-She is here for initial evaluation of the left great toe ulcer. She is a poor historian in regards to timeframe in detail. She states approximately 4 weeks ago she lacerated her toe on something in the house. She followed up with her primary care who placed her on Bactrim and ultimately a second dose of Bactrim prior to coming to wound clinic. She states she has been treating the toe with peroxide, Betadine and a Band-Aid. She did not check her blood sugar this morning but checked it yesterday morning it was 327; she is unaware of a recent A1c and there are no current records. She saw Dr. she would've orthopedics last week  for an old injury to the left ankle, she states he did not see her toe, nor did she bring it to his attention. She smokes approximately 1 pack cigarettes a day. Her social situation is concerning, she arrives this morning with her mother who appears extremely intoxicated/under the influence; her mother was asked to leave the room and be monitored by the patient's grandmother. The patient's aunt then accompanied the patient and the room throughout the rest of the appointment. We had a lengthy discussion regarding the deleterious effects of uncontrolled hyperglycemia and smoking as it relates to wound healing and overall health. She was strongly encouraged to decrease her smoking and get her diabetes under better control. She states she is currently on a diet and has cut down her [facility]  Type 2 diabetes mellitus with diabetic polyneuropathy Long term (current) use of insulin End stage renal disease Dependence on renal dialysis Corns and callosities Procedures Wound #12R Pre-procedure diagnosis of Wound #12R is a Diabetic Wound/Ulcer of the Lower Extremity located on the Left,Medial,Plantar Foot . There was a T Contact otal Cast Procedure by [name], PA-CMaryclare Bean, Carmin A ([id number]) [id number] Page 19 of 20 Post procedure Diagnosis Wound #12R: Same as Pre-Procedure Plan Follow-up Appointments: Return Appointment in 1 week. Bathing/ Shower/ Hygiene: May shower; gently cleanse wound with antibacterial soap, rinse and pat dry prior to dressing wounds No tub bath. Off-Loading: T Contact Cast to Left Lower Extremity otal Open toe surgical shoe - right foot WOUND #12R: - Foot Wound Laterality: Plantar, Left, Medial Cleanser: Vashe 5.8 (oz) Discharge Instructions: Use vashe 5.8 (oz) as directed Topical: Gentamicin Discharge Instructions: Apply as directed by  provider. Prim Dressing: Hydrofera Blue Ready Transfer Foam, 2.5x2.5 (in/in) ary Discharge Instructions: Apply Hydrofera Blue Ready to wound bed as directed Secondary Dressing: ABD Pad 5x9 (in/in) Discharge Instructions: Cover with ABD pad 1. I am going to recommend that we continue with a total contact cast which does seem to be doing a great job here. 2. Working to continue as well with the [employer]	[place] which I think is doing excellent. We will see patient back for reevaluation in 1 week here in the clinic. If anything worsens or changes patient will contact our office for additional recommendations. Electronic Signature(s) Signed: 08/07/2023 1:53:55 PM By: [name] PA-C Entered By: [name] on 08/07/2023 10:53:55 -------------------------------------------------------------------------------- Total Contact Cast Details Patient Name: Date of Service: TITILAYO, HAGANS 08/07/2023 12:45 PM Medical Record Number: [id number] Patient Account Number: [id number] Date of Birth/Sex: Treating RN: 1976-01-15 (47 y.o. [name] Primary Care Provider: [name] Other Clinician: [name] Referring Provider: Treating Provider/Extender: [name] Weeks in Treatment: 72 T Contact Cast Applied for Wound Assessment: otal Wound #12R Left,Medial,Plantar Foot Performed By: Physician [name], PA-C Post Procedure Diagnosis Same as Pre-procedure Electronic Signature(s) Signed: 08/07/2023 4:48:00 PM By: [name] PA-C Signed: 08/08/2023 4:35:53 PM By: [name] Entered By: [name] on 08/07/2023 10:21:52 Earmon Phoenix A ([id number]) [id number] Page 20 of 20 -------------------------------------------------------------------------------- SuperBill Details Patient Name: Date of Service: AVICE, FUNCHESS 08/07/2023 Medical Record Number: [id number] Patient Account Number: [id number] Date of Birth/Sex: Treating  RN: 08/09/1976 (47 y.o. 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.42 Type 2 diabetes mellitus with diabetic polyneuropathy Z79.4 Long term (current) use of insulin N18.6 End stage renal disease Z99.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 L84 Corns and callosities Facility Procedures : CPT4 Code: 32202542 Description: 29445 - APPLY TOTAL CONTACT LEG CAST ICD-10 Diagnosis Description 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 Modifier: ity Quantity: 1 Physician Procedures : CPT4 Code Description Modifier [id number] - WC PHYS APPLY TOTAL CONTACT CAST ICD-10 Diagnosis Description L97.528 Non-pressure chronic ulcer of other part of left foot with other specified severity Quantity: 1 Electronic Signature(s) Signed: 08/07/2023 1:54:31 PM By: [name] PA-C Previous Signature: 08/07/2023 1:54:08 PM Version By: [name] PA-C Entered By: [name] on 08/07/2023 10:54:31  this is much better than last week no debridement even necessary today. We may have to perform some slight debridement come next week we will see how things go. Monitor for any signs of worsening overall and obviously if anything changes she should be letting us know. 08-01-2023 upon evaluation today patient appears to be doing excellent in regard to her feet. The right foot/heel area is completely healed she does have some callus buildup I am going to trim this down a little bit I do not want to build up to the point of cracking she is doing well with the urea cream. With regard to the left heel she is doing well with casting I am going to perform some debridement here to clear away before reapplying the cast and I think she is really doing quite well though. 08-07-2023 upon evaluation patient's wound actually showed signs of excellent improvement and actually very pleased with where we stand I do believe that she is making good headway towards complete closure which is great news. Electronic Signature(s) Signed: 08/07/2023 1:53:19 PM By: [name] PA-C Previous Signature: 08/07/2023 1:18:48 PM Version By: [name] PA-C Entered By: [name] on 08/07/2023 10:53:19 -------------------------------------------------------------------------------- Physical Exam Details Patient Name: Date of Service: KIDA, DIGIULIO 08/07/2023 12:45 PM Medical Record Number: [id number] Patient Account Number: [id number] Date of Birth/Sex: Treating RN: 08-19-1976 (47 y.o. [name] Primary Care Provider: [name] Other Clinician: [name] Referring Provider: Treating  Provider/Extender: [name] Weeks in Treatment: 24 Constitutional Well-nourished and well-hydrated in no acute distress. Respiratory normal breathing without difficulty. CANDANCE, BOHLMAN A ([id number]) [id number] Page 9 of 20 Psychiatric this patient is able to make decisions and demonstrates good insight into disease process. Alert and Oriented x 3. pleasant and cooperative. Notes Patient's wound bed showed signs of good granulation and epithelization at this point. Fortunately I think that she is doing well with a total contact casting working to continue as such with that and I did go ahead and reapply that here in the office today. Electronic Signature(s) Signed: 08/07/2023 1:53:36 PM By: [name] PA-C Entered By: [name] on 08/07/2023 10:53:36 -------------------------------------------------------------------------------- Physician Orders Details Patient Name: Date of Service: [name] 08/07/2023 12:45 PM Medical Record Number: [id number] Patient Account Number: [id number] Date of Birth/Sex: Treating RN: 02/05/1976 (47 y.o. 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 Bathing/ Shower/ Hygiene May shower; gently cleanse wound with antibacterial soap, rinse and pat dry  prior to dressing wounds No tub bath. Off-Loading Total Contact Cast to Left Lower Extremity Open toe surgical shoe - right foot Wound Treatment Wound #12R - Foot Wound Laterality: Plantar, Left, Medial Cleanser: Vashe 5.8 (oz) Discharge Instructions: Use vashe 5.8 (oz) as directed Topical: Gentamicin Discharge Instructions: Apply as directed by provider. Prim Dressing: Hydrofera Blue Ready Transfer Foam, 2.5x2.5 (in/in) ary Discharge Instructions: Apply Hydrofera Blue Ready to wound bed as directed Secondary Dressing: ABD Pad 5x9 (in/in)  LAMIYAH, SCHLOTTER A ([id number]) [id number] Page 1 of 20 Visit Report for 08/07/2023 Chief Complaint Document Details Patient Name: Date of Service: ODYSSEY, VASBINDER 08/07/2023 12:45 PM Medical Record Number: [id number] Patient Account Number: [id number] Date of Birth/Sex: Treating RN: 24-Apr-1976 (47 y.o. [name] Primary Care Provider: [name] Other Clinician: [name] Referring Provider: Treating Provider/Extender: [name] Weeks in Treatment: 72 Information Obtained from: Patient Chief Complaint Left foot ulcer Electronic Signature(s) Signed: 08/07/2023 12:44:47 PM By: [name] PA-C Entered By: [name] on 08/07/2023 09:44:47 -------------------------------------------------------------------------------- HPI Details Patient Name: Date of Service: [name] 08/07/2023 12:45 PM Medical Record Number: [id number] Patient Account Number: [id number] Date of Birth/Sex: Treating RN: Mar 24, 1976 (47 y.o. [name] Primary Care Provider: [name] Other Clinician: [name] Referring Provider: Treating Provider/Extender: [name] in Treatment: 72 History of Present Illness HPI Description: 01/18/18-She is here for initial evaluation of the left great toe ulcer. She is a poor historian in regards to timeframe in detail. She states approximately 4 weeks ago she lacerated her toe on something in the house. She followed up with her primary care who placed her on Bactrim and ultimately a second dose of Bactrim prior to coming to wound clinic. She states she has been treating the toe with peroxide, Betadine and a Band-Aid. She did not check her blood sugar this morning but checked it yesterday morning it was 327; she is unaware of a recent A1c and there are no current records. She saw Dr. she would've orthopedics last week for an old injury to the left ankle, she states he did  not see her toe, nor did she bring it to his attention. She smokes approximately 1 pack cigarettes a day. Her social situation is concerning, she arrives this morning with her mother who appears extremely intoxicated/under the influence; her mother was asked to leave the room and be monitored by the patient's grandmother. The patient's aunt then accompanied the patient and the room throughout the rest of the appointment. We had a lengthy discussion regarding the deleterious effects of uncontrolled hyperglycemia and smoking as it relates to wound healing and overall health. She was strongly encouraged to decrease her smoking and get her diabetes under better control. She states she is currently on a diet and has cut down her [facility] consumption. The left toe is erythematous, macerated and slightly edematous with malodor present. The edema in her left foot is below her baseline, there is no erythema streaking. We will treat her with Santyl, doxycycline; we have ordered and xray, culture and provided a Peg assist surgical shoe and cultured the wound. 01/25/18-She is here in follow-up evaluation for a left great toe ulcer and presents with an abscess to her suprapubic area. She states her blood sugars remain elevated, feeling "sick" and if levels are below 250, but she is trying. She has made no attempt to decrease her smoking stating that we "can't take away her food in her cigarettes". She has been compliant with offloading using the PEG assist you. She is using Santyl daily. the culture obtained last week grew staph aureus and Enterococcus faecalis; continues on the doxycycline and Augmentin was added on Monday. The suprapubic area has erythema, no femoral variation, purple discoloration, minimal induration, was accessed with a cotton tip applicator with sanguinopurulent drainage, this was cultured, I suspect the DAYA, DUTT A ([id number]) [id number].pdf Page 2 of  20 current antibiotic treatment will cover and we  Type 2 diabetes mellitus with diabetic polyneuropathy Long term (current) use of insulin End stage renal disease Dependence on renal dialysis Corns and callosities Procedures Wound #12R Pre-procedure diagnosis of Wound #12R is a Diabetic Wound/Ulcer of the Lower Extremity located on the Left,Medial,Plantar Foot . There was a T Contact otal Cast Procedure by [name], PA-CMaryclare Bean, Carmin A ([id number]) [id number] Page 19 of 20 Post procedure Diagnosis Wound #12R: Same as Pre-Procedure Plan Follow-up Appointments: Return Appointment in 1 week. Bathing/ Shower/ Hygiene: May shower; gently cleanse wound with antibacterial soap, rinse and pat dry prior to dressing wounds No tub bath. Off-Loading: T Contact Cast to Left Lower Extremity otal Open toe surgical shoe - right foot WOUND #12R: - Foot Wound Laterality: Plantar, Left, Medial Cleanser: Vashe 5.8 (oz) Discharge Instructions: Use vashe 5.8 (oz) as directed Topical: Gentamicin Discharge Instructions: Apply as directed by  provider. Prim Dressing: Hydrofera Blue Ready Transfer Foam, 2.5x2.5 (in/in) ary Discharge Instructions: Apply Hydrofera Blue Ready to wound bed as directed Secondary Dressing: ABD Pad 5x9 (in/in) Discharge Instructions: Cover with ABD pad 1. I am going to recommend that we continue with a total contact cast which does seem to be doing a great job here. 2. Working to continue as well with the [employer]	[place] which I think is doing excellent. We will see patient back for reevaluation in 1 week here in the clinic. If anything worsens or changes patient will contact our office for additional recommendations. Electronic Signature(s) Signed: 08/07/2023 1:53:55 PM By: [name] PA-C Entered By: [name] on 08/07/2023 10:53:55 -------------------------------------------------------------------------------- Total Contact Cast Details Patient Name: Date of Service: TITILAYO, HAGANS 08/07/2023 12:45 PM Medical Record Number: [id number] Patient Account Number: [id number] Date of Birth/Sex: Treating RN: 1976-01-15 (47 y.o. [name] Primary Care Provider: [name] Other Clinician: [name] Referring Provider: Treating Provider/Extender: [name] Weeks in Treatment: 72 T Contact Cast Applied for Wound Assessment: otal Wound #12R Left,Medial,Plantar Foot Performed By: Physician [name], PA-C Post Procedure Diagnosis Same as Pre-procedure Electronic Signature(s) Signed: 08/07/2023 4:48:00 PM By: [name] PA-C Signed: 08/08/2023 4:35:53 PM By: [name] Entered By: [name] on 08/07/2023 10:21:52 Earmon Phoenix A ([id number]) [id number] Page 20 of 20 -------------------------------------------------------------------------------- SuperBill Details Patient Name: Date of Service: AVICE, FUNCHESS 08/07/2023 Medical Record Number: [id number] Patient Account Number: [id number] Date of Birth/Sex: Treating  RN: 08/09/1976 (47 y.o. 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.42 Type 2 diabetes mellitus with diabetic polyneuropathy Z79.4 Long term (current) use of insulin N18.6 End stage renal disease Z99.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 L84 Corns and callosities Facility Procedures : CPT4 Code: 32202542 Description: 29445 - APPLY TOTAL CONTACT LEG CAST ICD-10 Diagnosis Description L97.528 Non-pressure chronic ulcer of other part of left foot with other specified sever Modifier: ity Quantity: 1 Physician Procedures : CPT4 Code Description Modifier [id number] - WC PHYS APPLY TOTAL CONTACT CAST ICD-10 Diagnosis Description L97.528 Non-pressure chronic ulcer of other part of left foot with other specified severity Quantity: 1 Electronic Signature(s) Signed: 08/07/2023 1:54:31 PM By: [name] PA-C Previous Signature: 08/07/2023 1:54:08 PM Version By: [name] PA-C Entered By: [name] on 08/07/2023 10:54:31  this is much better than last week no debridement even necessary today. We may have to perform some slight debridement come next week we will see how things go. Monitor for any signs of worsening overall and obviously if anything changes she should be letting us know. 08-01-2023 upon evaluation today patient appears to be doing excellent in regard to her feet. The right foot/heel area is completely healed she does have some callus buildup I am going to trim this down a little bit I do not want to build up to the point of cracking she is doing well with the urea cream. With regard to the left heel she is doing well with casting I am going to perform some debridement here to clear away before reapplying the cast and I think she is really doing quite well though. 08-07-2023 upon evaluation patient's wound actually showed signs of excellent improvement and actually very pleased with where we stand I do believe that she is making good headway towards complete closure which is great news. Electronic Signature(s) Signed: 08/07/2023 1:53:19 PM By: [name] PA-C Previous Signature: 08/07/2023 1:18:48 PM Version By: [name] PA-C Entered By: [name] on 08/07/2023 10:53:19 -------------------------------------------------------------------------------- Physical Exam Details Patient Name: Date of Service: KIDA, DIGIULIO 08/07/2023 12:45 PM Medical Record Number: [id number] Patient Account Number: [id number] Date of Birth/Sex: Treating RN: 08-19-1976 (47 y.o. [name] Primary Care Provider: [name] Other Clinician: [name] Referring Provider: Treating  Provider/Extender: [name] Weeks in Treatment: 24 Constitutional Well-nourished and well-hydrated in no acute distress. Respiratory normal breathing without difficulty. CANDANCE, BOHLMAN A ([id number]) [id number] Page 9 of 20 Psychiatric this patient is able to make decisions and demonstrates good insight into disease process. Alert and Oriented x 3. pleasant and cooperative. Notes Patient's wound bed showed signs of good granulation and epithelization at this point. Fortunately I think that she is doing well with a total contact casting working to continue as such with that and I did go ahead and reapply that here in the office today. Electronic Signature(s) Signed: 08/07/2023 1:53:36 PM By: [name] PA-C Entered By: [name] on 08/07/2023 10:53:36 -------------------------------------------------------------------------------- Physician Orders Details Patient Name: Date of Service: [name] 08/07/2023 12:45 PM Medical Record Number: [id number] Patient Account Number: [id number] Date of Birth/Sex: Treating RN: 02/05/1976 (47 y.o. 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 Bathing/ Shower/ Hygiene May shower; gently cleanse wound with antibacterial soap, rinse and pat dry  prior to dressing wounds No tub bath. Off-Loading Total Contact Cast to Left Lower Extremity Open toe surgical shoe - right foot Wound Treatment Wound #12R - Foot Wound Laterality: Plantar, Left, Medial Cleanser: Vashe 5.8 (oz) Discharge Instructions: Use vashe 5.8 (oz) as directed Topical: Gentamicin Discharge Instructions: Apply as directed by provider. Prim Dressing: Hydrofera Blue Ready Transfer Foam, 2.5x2.5 (in/in) ary Discharge Instructions: Apply Hydrofera Blue Ready to wound bed as directed Secondary Dressing: ABD Pad 5x9 (in/in)  LAMIYAH, SCHLOTTER A ([id number]) [id number] Page 1 of 20 Visit Report for 08/07/2023 Chief Complaint Document Details Patient Name: Date of Service: ODYSSEY, VASBINDER 08/07/2023 12:45 PM Medical Record Number: [id number] Patient Account Number: [id number] Date of Birth/Sex: Treating RN: 24-Apr-1976 (47 y.o. [name] Primary Care Provider: [name] Other Clinician: [name] Referring Provider: Treating Provider/Extender: [name] Weeks in Treatment: 72 Information Obtained from: Patient Chief Complaint Left foot ulcer Electronic Signature(s) Signed: 08/07/2023 12:44:47 PM By: [name] PA-C Entered By: [name] on 08/07/2023 09:44:47 -------------------------------------------------------------------------------- HPI Details Patient Name: Date of Service: [name] 08/07/2023 12:45 PM Medical Record Number: [id number] Patient Account Number: [id number] Date of Birth/Sex: Treating RN: Mar 24, 1976 (47 y.o. [name] Primary Care Provider: [name] Other Clinician: [name] Referring Provider: Treating Provider/Extender: [name] in Treatment: 72 History of Present Illness HPI Description: 01/18/18-She is here for initial evaluation of the left great toe ulcer. She is a poor historian in regards to timeframe in detail. She states approximately 4 weeks ago she lacerated her toe on something in the house. She followed up with her primary care who placed her on Bactrim and ultimately a second dose of Bactrim prior to coming to wound clinic. She states she has been treating the toe with peroxide, Betadine and a Band-Aid. She did not check her blood sugar this morning but checked it yesterday morning it was 327; she is unaware of a recent A1c and there are no current records. She saw Dr. she would've orthopedics last week for an old injury to the left ankle, she states he did  not see her toe, nor did she bring it to his attention. She smokes approximately 1 pack cigarettes a day. Her social situation is concerning, she arrives this morning with her mother who appears extremely intoxicated/under the influence; her mother was asked to leave the room and be monitored by the patient's grandmother. The patient's aunt then accompanied the patient and the room throughout the rest of the appointment. We had a lengthy discussion regarding the deleterious effects of uncontrolled hyperglycemia and smoking as it relates to wound healing and overall health. She was strongly encouraged to decrease her smoking and get her diabetes under better control. She states she is currently on a diet and has cut down her [facility] consumption. The left toe is erythematous, macerated and slightly edematous with malodor present. The edema in her left foot is below her baseline, there is no erythema streaking. We will treat her with Santyl, doxycycline; we have ordered and xray, culture and provided a Peg assist surgical shoe and cultured the wound. 01/25/18-She is here in follow-up evaluation for a left great toe ulcer and presents with an abscess to her suprapubic area. She states her blood sugars remain elevated, feeling "sick" and if levels are below 250, but she is trying. She has made no attempt to decrease her smoking stating that we "can't take away her food in her cigarettes". She has been compliant with offloading using the PEG assist you. She is using Santyl daily. the culture obtained last week grew staph aureus and Enterococcus faecalis; continues on the doxycycline and Augmentin was added on Monday. The suprapubic area has erythema, no femoral variation, purple discoloration, minimal induration, was accessed with a cotton tip applicator with sanguinopurulent drainage, this was cultured, I suspect the DAYA, DUTT A ([id number]) [id number].pdf Page 2 of  20 current antibiotic treatment will cover and we  Discharge Instructions: Cover with ABD pad TANAIA, HAWKEY A ([id number]) [id number] Page 10 of 20 Electronic Signature(s) Signed: 08/07/2023 4:48:00 PM By: [name] PA-C Signed: 08/08/2023 4:35:53 PM By: [name] Entered By: [name] on 08/07/2023 10:22:27 -------------------------------------------------------------------------------- Problem List Details Patient Name: Date of Service: [name] 08/07/2023 12:45 PM Medical Record Number: [id number] Patient Account Number: [id number] Date of Birth/Sex: Treating RN: Sep 03, 1976 (47 y.o. 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 03/16/2022 No Yes severity E11.42 Type 2 diabetes mellitus with diabetic polyneuropathy 03/16/2022 No Yes Z79.4 Long term (current) use of insulin 02/22/2023 No Yes N18.6 End stage renal disease 07/14/2023 No Yes Z99.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 07/14/2023 No Yes L84 Corns and callosities 08/01/2023 No Yes Inactive Problems Resolved Problems ICD-10 Code  Description Active Date Resolved Date L97.512 Non-pressure chronic ulcer of other part of right foot with fat layer exposed 03/16/2022 03/16/2022 L97.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 of skin 03/16/2022 03/16/2022 ASYIA, HORNUNG A ([id number]) [phone].pdf Page 11 of 20 W54.0XXA Bitten by dog, initial encounter 05/17/2023 05/17/2023 S81.801A Unspecified open wound, right lower leg, initial encounter 05/17/2023 05/17/2023 Q03.474 Type 2 diabetes mellitus with other skin ulcer 05/17/2023 05/17/2023 Electronic Signature(s) Signed: 08/07/2023 12:44:43 PM By: [name] PA-C Entered By: [name] on 08/07/2023 09:44:43 -------------------------------------------------------------------------------- Progress Note Details Patient Name: Date of Service: [name] 08/07/2023 12:45 PM Medical Record Number: [id number] Patient Account Number: [id number] Date of Birth/Sex: Treating RN: [birth year] (47 y.o. [name] Primary Care Provider: [name] Other Clinician: [name] Referring Provider: Treating Provider/Extender: [name] in Treatment: 72 Subjective Chief Complaint Information obtained from Patient Left foot ulcer History of Present Illness (HPI) 01/18/18-She is here for initial evaluation of the left great toe ulcer. She is a poor historian in regards to timeframe in detail. She states approximately 4 weeks ago she lacerated her toe on something in the house. She followed up with her primary care who placed her on Bactrim and ultimately a second dose of Bactrim prior to coming to wound clinic. She states she has been treating the toe with peroxide, Betadine and a Band-Aid. She did not check her blood sugar this morning but checked it yesterday morning it was 327; she is unaware of a recent A1c and there are no current records. She saw Dr. she would've orthopedics last week  for an old injury to the left ankle, she states he did not see her toe, nor did she bring it to his attention. She smokes approximately 1 pack cigarettes a day. Her social situation is concerning, she arrives this morning with her mother who appears extremely intoxicated/under the influence; her mother was asked to leave the room and be monitored by the patient's grandmother. The patient's aunt then accompanied the patient and the room throughout the rest of the appointment. We had a lengthy discussion regarding the deleterious effects of uncontrolled hyperglycemia and smoking as it relates to wound healing and overall health. She was strongly encouraged to decrease her smoking and get her diabetes under better control. She states she is currently on a diet and has cut down her [facility]  LAMIYAH, SCHLOTTER A ([id number]) [id number] Page 1 of 20 Visit Report for 08/07/2023 Chief Complaint Document Details Patient Name: Date of Service: ODYSSEY, VASBINDER 08/07/2023 12:45 PM Medical Record Number: [id number] Patient Account Number: [id number] Date of Birth/Sex: Treating RN: 24-Apr-1976 (47 y.o. [name] Primary Care Provider: [name] Other Clinician: [name] Referring Provider: Treating Provider/Extender: [name] Weeks in Treatment: 72 Information Obtained from: Patient Chief Complaint Left foot ulcer Electronic Signature(s) Signed: 08/07/2023 12:44:47 PM By: [name] PA-C Entered By: [name] on 08/07/2023 09:44:47 -------------------------------------------------------------------------------- HPI Details Patient Name: Date of Service: [name] 08/07/2023 12:45 PM Medical Record Number: [id number] Patient Account Number: [id number] Date of Birth/Sex: Treating RN: Mar 24, 1976 (47 y.o. [name] Primary Care Provider: [name] Other Clinician: [name] Referring Provider: Treating Provider/Extender: [name] in Treatment: 72 History of Present Illness HPI Description: 01/18/18-She is here for initial evaluation of the left great toe ulcer. She is a poor historian in regards to timeframe in detail. She states approximately 4 weeks ago she lacerated her toe on something in the house. She followed up with her primary care who placed her on Bactrim and ultimately a second dose of Bactrim prior to coming to wound clinic. She states she has been treating the toe with peroxide, Betadine and a Band-Aid. She did not check her blood sugar this morning but checked it yesterday morning it was 327; she is unaware of a recent A1c and there are no current records. She saw Dr. she would've orthopedics last week for an old injury to the left ankle, she states he did  not see her toe, nor did she bring it to his attention. She smokes approximately 1 pack cigarettes a day. Her social situation is concerning, she arrives this morning with her mother who appears extremely intoxicated/under the influence; her mother was asked to leave the room and be monitored by the patient's grandmother. The patient's aunt then accompanied the patient and the room throughout the rest of the appointment. We had a lengthy discussion regarding the deleterious effects of uncontrolled hyperglycemia and smoking as it relates to wound healing and overall health. She was strongly encouraged to decrease her smoking and get her diabetes under better control. She states she is currently on a diet and has cut down her [facility] consumption. The left toe is erythematous, macerated and slightly edematous with malodor present. The edema in her left foot is below her baseline, there is no erythema streaking. We will treat her with Santyl, doxycycline; we have ordered and xray, culture and provided a Peg assist surgical shoe and cultured the wound. 01/25/18-She is here in follow-up evaluation for a left great toe ulcer and presents with an abscess to her suprapubic area. She states her blood sugars remain elevated, feeling "sick" and if levels are below 250, but she is trying. She has made no attempt to decrease her smoking stating that we "can't take away her food in her cigarettes". She has been compliant with offloading using the PEG assist you. She is using Santyl daily. the culture obtained last week grew staph aureus and Enterococcus faecalis; continues on the doxycycline and Augmentin was added on Monday. The suprapubic area has erythema, no femoral variation, purple discoloration, minimal induration, was accessed with a cotton tip applicator with sanguinopurulent drainage, this was cultured, I suspect the DAYA, DUTT A ([id number]) [id number].pdf Page 2 of  20 current antibiotic treatment will cover and we  this is much better than last week no debridement even necessary today. We may have to perform some slight debridement come next week we will see how things go. Monitor for any signs of worsening overall and obviously if anything changes she should be letting us know. 08-01-2023 upon evaluation today patient appears to be doing excellent in regard to her feet. The right foot/heel area is completely healed she does have some callus buildup I am going to trim this down a little bit I do not want to build up to the point of cracking she is doing well with the urea cream. With regard to the left heel she is doing well with casting I am going to perform some debridement here to clear away before reapplying the cast and I think she is really doing quite well though. 08-07-2023 upon evaluation patient's wound actually showed signs of excellent improvement and actually very pleased with where we stand I do believe that she is making good headway towards complete closure which is great news. Electronic Signature(s) Signed: 08/07/2023 1:53:19 PM By: [name] PA-C Previous Signature: 08/07/2023 1:18:48 PM Version By: [name] PA-C Entered By: [name] on 08/07/2023 10:53:19 -------------------------------------------------------------------------------- Physical Exam Details Patient Name: Date of Service: KIDA, DIGIULIO 08/07/2023 12:45 PM Medical Record Number: [id number] Patient Account Number: [id number] Date of Birth/Sex: Treating RN: 08-19-1976 (47 y.o. [name] Primary Care Provider: [name] Other Clinician: [name] Referring Provider: Treating  Provider/Extender: [name] Weeks in Treatment: 24 Constitutional Well-nourished and well-hydrated in no acute distress. Respiratory normal breathing without difficulty. CANDANCE, BOHLMAN A ([id number]) [id number] Page 9 of 20 Psychiatric this patient is able to make decisions and demonstrates good insight into disease process. Alert and Oriented x 3. pleasant and cooperative. Notes Patient's wound bed showed signs of good granulation and epithelization at this point. Fortunately I think that she is doing well with a total contact casting working to continue as such with that and I did go ahead and reapply that here in the office today. Electronic Signature(s) Signed: 08/07/2023 1:53:36 PM By: [name] PA-C Entered By: [name] on 08/07/2023 10:53:36 -------------------------------------------------------------------------------- Physician Orders Details Patient Name: Date of Service: [name] 08/07/2023 12:45 PM Medical Record Number: [id number] Patient Account Number: [id number] Date of Birth/Sex: Treating RN: 02/05/1976 (47 y.o. 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 Bathing/ Shower/ Hygiene May shower; gently cleanse wound with antibacterial soap, rinse and pat dry  prior to dressing wounds No tub bath. Off-Loading Total Contact Cast to Left Lower Extremity Open toe surgical shoe - right foot Wound Treatment Wound #12R - Foot Wound Laterality: Plantar, Left, Medial Cleanser: Vashe 5.8 (oz) Discharge Instructions: Use vashe 5.8 (oz) as directed Topical: Gentamicin Discharge Instructions: Apply as directed by provider. Prim Dressing: Hydrofera Blue Ready Transfer Foam, 2.5x2.5 (in/in) ary Discharge Instructions: Apply Hydrofera Blue Ready to wound bed as directed Secondary Dressing: ABD Pad 5x9 (in/in)  LAMIYAH, SCHLOTTER A ([id number]) [id number] Page 1 of 20 Visit Report for 08/07/2023 Chief Complaint Document Details Patient Name: Date of Service: ODYSSEY, VASBINDER 08/07/2023 12:45 PM Medical Record Number: [id number] Patient Account Number: [id number] Date of Birth/Sex: Treating RN: 24-Apr-1976 (47 y.o. [name] Primary Care Provider: [name] Other Clinician: [name] Referring Provider: Treating Provider/Extender: [name] Weeks in Treatment: 72 Information Obtained from: Patient Chief Complaint Left foot ulcer Electronic Signature(s) Signed: 08/07/2023 12:44:47 PM By: [name] PA-C Entered By: [name] on 08/07/2023 09:44:47 -------------------------------------------------------------------------------- HPI Details Patient Name: Date of Service: [name] 08/07/2023 12:45 PM Medical Record Number: [id number] Patient Account Number: [id number] Date of Birth/Sex: Treating RN: Mar 24, 1976 (47 y.o. [name] Primary Care Provider: [name] Other Clinician: [name] Referring Provider: Treating Provider/Extender: [name] in Treatment: 72 History of Present Illness HPI Description: 01/18/18-She is here for initial evaluation of the left great toe ulcer. She is a poor historian in regards to timeframe in detail. She states approximately 4 weeks ago she lacerated her toe on something in the house. She followed up with her primary care who placed her on Bactrim and ultimately a second dose of Bactrim prior to coming to wound clinic. She states she has been treating the toe with peroxide, Betadine and a Band-Aid. She did not check her blood sugar this morning but checked it yesterday morning it was 327; she is unaware of a recent A1c and there are no current records. She saw Dr. she would've orthopedics last week for an old injury to the left ankle, she states he did  not see her toe, nor did she bring it to his attention. She smokes approximately 1 pack cigarettes a day. Her social situation is concerning, she arrives this morning with her mother who appears extremely intoxicated/under the influence; her mother was asked to leave the room and be monitored by the patient's grandmother. The patient's aunt then accompanied the patient and the room throughout the rest of the appointment. We had a lengthy discussion regarding the deleterious effects of uncontrolled hyperglycemia and smoking as it relates to wound healing and overall health. She was strongly encouraged to decrease her smoking and get her diabetes under better control. She states she is currently on a diet and has cut down her [facility] consumption. The left toe is erythematous, macerated and slightly edematous with malodor present. The edema in her left foot is below her baseline, there is no erythema streaking. We will treat her with Santyl, doxycycline; we have ordered and xray, culture and provided a Peg assist surgical shoe and cultured the wound. 01/25/18-She is here in follow-up evaluation for a left great toe ulcer and presents with an abscess to her suprapubic area. She states her blood sugars remain elevated, feeling "sick" and if levels are below 250, but she is trying. She has made no attempt to decrease her smoking stating that we "can't take away her food in her cigarettes". She has been compliant with offloading using the PEG assist you. She is using Santyl daily. the culture obtained last week grew staph aureus and Enterococcus faecalis; continues on the doxycycline and Augmentin was added on Monday. The suprapubic area has erythema, no femoral variation, purple discoloration, minimal induration, was accessed with a cotton tip applicator with sanguinopurulent drainage, this was cultured, I suspect the DAYA, DUTT A ([id number]) [id number].pdf Page 2 of  20 current antibiotic treatment will cover and we  Discharge Instructions: Cover with ABD pad TANAIA, HAWKEY A ([id number]) [id number] Page 10 of 20 Electronic Signature(s) Signed: 08/07/2023 4:48:00 PM By: [name] PA-C Signed: 08/08/2023 4:35:53 PM By: [name] Entered By: [name] on 08/07/2023 10:22:27 -------------------------------------------------------------------------------- Problem List Details Patient Name: Date of Service: [name] 08/07/2023 12:45 PM Medical Record Number: [id number] Patient Account Number: [id number] Date of Birth/Sex: Treating RN: Sep 03, 1976 (47 y.o. 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 03/16/2022 No Yes severity E11.42 Type 2 diabetes mellitus with diabetic polyneuropathy 03/16/2022 No Yes Z79.4 Long term (current) use of insulin 02/22/2023 No Yes N18.6 End stage renal disease 07/14/2023 No Yes Z99.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 07/14/2023 No Yes L84 Corns and callosities 08/01/2023 No Yes Inactive Problems Resolved Problems ICD-10 Code  Description Active Date Resolved Date L97.512 Non-pressure chronic ulcer of other part of right foot with fat layer exposed 03/16/2022 03/16/2022 L97.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 of skin 03/16/2022 03/16/2022 ASYIA, HORNUNG A ([id number]) [id number] Page 11 of 20 W54.0XXA Bitten by dog, initial encounter 05/17/2023 05/17/2023 S81.801A Unspecified open wound, right lower leg, initial encounter 05/17/2023 05/17/2023 Q03.474 Type 2 diabetes mellitus with other skin ulcer 05/17/2023 05/17/2023 Electronic Signature(s) Signed: 08/07/2023 12:44:43 PM By: [name] PA-C Entered By: [name] on 08/07/2023 09:44:43 -------------------------------------------------------------------------------- Progress Note Details Patient Name: Date of Service: [name] 08/07/2023 12:45 PM Medical Record Number: [id number] Patient Account Number: [id number] Date of Birth/Sex: Treating RN: [birth year] (47 y.o. [name] Primary Care Provider: [name] Other Clinician: [name] Referring Provider: Treating Provider/Extender: [name] in Treatment: 72 Subjective Chief Complaint Information obtained from Patient Left foot ulcer History of Present Illness (HPI) 01/18/18-She is here for initial evaluation of the left great toe ulcer. She is a poor historian in regards to timeframe in detail. She states approximately 4 weeks ago she lacerated her toe on something in the house. She followed up with her primary care who placed her on Bactrim and ultimately a second dose of Bactrim prior to coming to wound clinic. She states she has been treating the toe with peroxide, Betadine and a Band-Aid. She did not check her blood sugar this morning but checked it yesterday morning it was 327; she is unaware of a recent A1c and there are no current records. She saw Dr. she would've orthopedics last week  for an old injury to the left ankle, she states he did not see her toe, nor did she bring it to his attention. She smokes approximately 1 pack cigarettes a day. Her social situation is concerning, she arrives this morning with her mother who appears extremely intoxicated/under the influence; her mother was asked to leave the room and be monitored by the patient's grandmother. The patient's aunt then accompanied the patient and the room throughout the rest of the appointment. We had a lengthy discussion regarding the deleterious effects of uncontrolled hyperglycemia and smoking as it relates to wound healing and overall health. She was strongly encouraged to decrease her smoking and get her diabetes under better control. She states she is currently on a diet and has cut down her [facility]  Type 2 diabetes mellitus with diabetic polyneuropathy Long term (current) use of insulin End stage renal disease Dependence on renal dialysis Corns and callosities Procedures Wound #12R Pre-procedure diagnosis of Wound #12R is a Diabetic Wound/Ulcer of the Lower Extremity located on the Left,Medial,Plantar Foot . There was a T Contact otal Cast Procedure by [name], PA-CMaryclare Bean, Carmin A ([id number]) [id number] Page 19 of 20 Post procedure Diagnosis Wound #12R: Same as Pre-Procedure Plan Follow-up Appointments: Return Appointment in 1 week. Bathing/ Shower/ Hygiene: May shower; gently cleanse wound with antibacterial soap, rinse and pat dry prior to dressing wounds No tub bath. Off-Loading: T Contact Cast to Left Lower Extremity otal Open toe surgical shoe - right foot WOUND #12R: - Foot Wound Laterality: Plantar, Left, Medial Cleanser: Vashe 5.8 (oz) Discharge Instructions: Use vashe 5.8 (oz) as directed Topical: Gentamicin Discharge Instructions: Apply as directed by  provider. Prim Dressing: Hydrofera Blue Ready Transfer Foam, 2.5x2.5 (in/in) ary Discharge Instructions: Apply Hydrofera Blue Ready to wound bed as directed Secondary Dressing: ABD Pad 5x9 (in/in) Discharge Instructions: Cover with ABD pad 1. I am going to recommend that we continue with a total contact cast which does seem to be doing a great job here. 2. Working to continue as well with the [employer]	[place] which I think is doing excellent. We will see patient back for reevaluation in 1 week here in the clinic. If anything worsens or changes patient will contact our office for additional recommendations. Electronic Signature(s) Signed: 08/07/2023 1:53:55 PM By: [name] PA-C Entered By: [name] on 08/07/2023 10:53:55 -------------------------------------------------------------------------------- Total Contact Cast Details Patient Name: Date of Service: TITILAYO, HAGANS 08/07/2023 12:45 PM Medical Record Number: [id number] Patient Account Number: [id number] Date of Birth/Sex: Treating RN: 1976-01-15 (47 y.o. [name] Primary Care Provider: [name] Other Clinician: [name] Referring Provider: Treating Provider/Extender: [name] Weeks in Treatment: 72 T Contact Cast Applied for Wound Assessment: otal Wound #12R Left,Medial,Plantar Foot Performed By: Physician [name], PA-C Post Procedure Diagnosis Same as Pre-procedure Electronic Signature(s) Signed: 08/07/2023 4:48:00 PM By: [name] PA-C Signed: 08/08/2023 4:35:53 PM By: [name] Entered By: [name] on 08/07/2023 10:21:52 Earmon Phoenix A ([id number]) [id number] Page 20 of 20 -------------------------------------------------------------------------------- SuperBill Details Patient Name: Date of Service: AVICE, FUNCHESS 08/07/2023 Medical Record Number: [id number] Patient Account Number: [id number] Date of Birth/Sex: Treating  RN: 08/09/1976 (47 y.o. 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.42 Type 2 diabetes mellitus with diabetic polyneuropathy Z79.4 Long term (current) use of insulin N18.6 End stage renal disease Z99.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 L84 Corns and callosities Facility Procedures : CPT4 Code: [id number] Description: 29445 - APPLY TOTAL CONTACT LEG CAST ICD-10 Diagnosis Description L97.528 Non-pressure chronic ulcer of other part of left foot with other specified sever Modifier: ity Quantity: 1 Physician Procedures : CPT4 Code Description Modifier [id number] - WC PHYS APPLY TOTAL CONTACT CAST ICD-10 Diagnosis Description L97.528 Non-pressure chronic ulcer of other part of left foot with other specified severity Quantity: 1 Electronic Signature(s) Signed: 08/07/2023 1:54:31 PM By: [name] PA-C Previous Signature: 08/07/2023 1:54:08 PM Version By: [name] PA-C Entered By: [name] on 08/07/2023 10:54:31  LAMIYAH, SCHLOTTER A ([id number]) [id number] Page 1 of 20 Visit Report for 08/07/2023 Chief Complaint Document Details Patient Name: Date of Service: ODYSSEY, VASBINDER 08/07/2023 12:45 PM Medical Record Number: [id number] Patient Account Number: [id number] Date of Birth/Sex: Treating RN: 24-Apr-1976 (47 y.o. [name] Primary Care Provider: [name] Other Clinician: [name] Referring Provider: Treating Provider/Extender: [name] Weeks in Treatment: 72 Information Obtained from: Patient Chief Complaint Left foot ulcer Electronic Signature(s) Signed: 08/07/2023 12:44:47 PM By: [name] PA-C Entered By: [name] on 08/07/2023 09:44:47 -------------------------------------------------------------------------------- HPI Details Patient Name: Date of Service: [name] 08/07/2023 12:45 PM Medical Record Number: [id number] Patient Account Number: [id number] Date of Birth/Sex: Treating RN: Mar 24, 1976 (47 y.o. [name] Primary Care Provider: [name] Other Clinician: [name] Referring Provider: Treating Provider/Extender: [name] in Treatment: 72 History of Present Illness HPI Description: 01/18/18-She is here for initial evaluation of the left great toe ulcer. She is a poor historian in regards to timeframe in detail. She states approximately 4 weeks ago she lacerated her toe on something in the house. She followed up with her primary care who placed her on Bactrim and ultimately a second dose of Bactrim prior to coming to wound clinic. She states she has been treating the toe with peroxide, Betadine and a Band-Aid. She did not check her blood sugar this morning but checked it yesterday morning it was 327; she is unaware of a recent A1c and there are no current records. She saw Dr. she would've orthopedics last week for an old injury to the left ankle, she states he did  not see her toe, nor did she bring it to his attention. She smokes approximately 1 pack cigarettes a day. Her social situation is concerning, she arrives this morning with her mother who appears extremely intoxicated/under the influence; her mother was asked to leave the room and be monitored by the patient's grandmother. The patient's aunt then accompanied the patient and the room throughout the rest of the appointment. We had a lengthy discussion regarding the deleterious effects of uncontrolled hyperglycemia and smoking as it relates to wound healing and overall health. She was strongly encouraged to decrease her smoking and get her diabetes under better control. She states she is currently on a diet and has cut down her [facility] consumption. The left toe is erythematous, macerated and slightly edematous with malodor present. The edema in her left foot is below her baseline, there is no erythema streaking. We will treat her with Santyl, doxycycline; we have ordered and xray, culture and provided a Peg assist surgical shoe and cultured the wound. 01/25/18-She is here in follow-up evaluation for a left great toe ulcer and presents with an abscess to her suprapubic area. She states her blood sugars remain elevated, feeling "sick" and if levels are below 250, but she is trying. She has made no attempt to decrease her smoking stating that we "can't take away her food in her cigarettes". She has been compliant with offloading using the PEG assist you. She is using Santyl daily. the culture obtained last week grew staph aureus and Enterococcus faecalis; continues on the doxycycline and Augmentin was added on Monday. The suprapubic area has erythema, no femoral variation, purple discoloration, minimal induration, was accessed with a cotton tip applicator with sanguinopurulent drainage, this was cultured, I suspect the DAYA, DUTT A ([id number]) [phone].pdf Page 2 of  20 current antibiotic treatment will cover and we  this is much better than last week no debridement even necessary today. We may have to perform some slight debridement come next week we will see how things go. Monitor for any signs of worsening overall and obviously if anything changes she should be letting us know. 08-01-2023 upon evaluation today patient appears to be doing excellent in regard to her feet. The right foot/heel area is completely healed she does have some callus buildup I am going to trim this down a little bit I do not want to build up to the point of cracking she is doing well with the urea cream. With regard to the left heel she is doing well with casting I am going to perform some debridement here to clear away before reapplying the cast and I think she is really doing quite well though. 08-07-2023 upon evaluation patient's wound actually showed signs of excellent improvement and actually very pleased with where we stand I do believe that she is making good headway towards complete closure which is great news. Electronic Signature(s) Signed: 08/07/2023 1:53:19 PM By: [name] PA-C Previous Signature: 08/07/2023 1:18:48 PM Version By: [name] PA-C Entered By: [name] on 08/07/2023 10:53:19 -------------------------------------------------------------------------------- Physical Exam Details Patient Name: Date of Service: KIDA, DIGIULIO 08/07/2023 12:45 PM Medical Record Number: [id number] Patient Account Number: [id number] Date of Birth/Sex: Treating RN: 08-19-1976 (47 y.o. [name] Primary Care Provider: [name] Other Clinician: [name] Referring Provider: Treating  Provider/Extender: [name] Weeks in Treatment: 24 Constitutional Well-nourished and well-hydrated in no acute distress. Respiratory normal breathing without difficulty. CANDANCE, BOHLMAN A ([id number]) [id number] Page 9 of 20 Psychiatric this patient is able to make decisions and demonstrates good insight into disease process. Alert and Oriented x 3. pleasant and cooperative. Notes Patient's wound bed showed signs of good granulation and epithelization at this point. Fortunately I think that she is doing well with a total contact casting working to continue as such with that and I did go ahead and reapply that here in the office today. Electronic Signature(s) Signed: 08/07/2023 1:53:36 PM By: [name] PA-C Entered By: [name] on 08/07/2023 10:53:36 -------------------------------------------------------------------------------- Physician Orders Details Patient Name: Date of Service: [name] 08/07/2023 12:45 PM Medical Record Number: [id number] Patient Account Number: [id number] Date of Birth/Sex: Treating RN: 02/05/1976 (47 y.o. 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 Bathing/ Shower/ Hygiene May shower; gently cleanse wound with antibacterial soap, rinse and pat dry  prior to dressing wounds No tub bath. Off-Loading Total Contact Cast to Left Lower Extremity Open toe surgical shoe - right foot Wound Treatment Wound #12R - Foot Wound Laterality: Plantar, Left, Medial Cleanser: Vashe 5.8 (oz) Discharge Instructions: Use vashe 5.8 (oz) as directed Topical: Gentamicin Discharge Instructions: Apply as directed by provider. Prim Dressing: Hydrofera Blue Ready Transfer Foam, 2.5x2.5 (in/in) ary Discharge Instructions: Apply Hydrofera Blue Ready to wound bed as directed Secondary Dressing: ABD Pad 5x9 (in/in)  this is much better than last week no debridement even necessary today. We may have to perform some slight debridement come next week we will see how things go. Monitor for any signs of worsening overall and obviously if anything changes she should be letting us know. 08-01-2023 upon evaluation today patient appears to be doing excellent in regard to her feet. The right foot/heel area is completely healed she does have some callus buildup I am going to trim this down a little bit I do not want to build up to the point of cracking she is doing well with the urea cream. With regard to the left heel she is doing well with casting I am going to perform some debridement here to clear away before reapplying the cast and I think she is really doing quite well though. 08-07-2023 upon evaluation patient's wound actually showed signs of excellent improvement and actually very pleased with where we stand I do believe that she is making good headway towards complete closure which is great news. Electronic Signature(s) Signed: 08/07/2023 1:53:19 PM By: [name] PA-C Previous Signature: 08/07/2023 1:18:48 PM Version By: [name] PA-C Entered By: [name] on 08/07/2023 10:53:19 -------------------------------------------------------------------------------- Physical Exam Details Patient Name: Date of Service: KIDA, DIGIULIO 08/07/2023 12:45 PM Medical Record Number: [id number] Patient Account Number: [id number] Date of Birth/Sex: Treating RN: 08-19-1976 (47 y.o. [name] Primary Care Provider: [name] Other Clinician: [name] Referring Provider: Treating  Provider/Extender: [name] Weeks in Treatment: 24 Constitutional Well-nourished and well-hydrated in no acute distress. Respiratory normal breathing without difficulty. CANDANCE, BOHLMAN A ([id number]) [id number] Page 9 of 20 Psychiatric this patient is able to make decisions and demonstrates good insight into disease process. Alert and Oriented x 3. pleasant and cooperative. Notes Patient's wound bed showed signs of good granulation and epithelization at this point. Fortunately I think that she is doing well with a total contact casting working to continue as such with that and I did go ahead and reapply that here in the office today. Electronic Signature(s) Signed: 08/07/2023 1:53:36 PM By: [name] PA-C Entered By: [name] on 08/07/2023 10:53:36 -------------------------------------------------------------------------------- Physician Orders Details Patient Name: Date of Service: [name] 08/07/2023 12:45 PM Medical Record Number: [id number] Patient Account Number: [id number] Date of Birth/Sex: Treating RN: 02/05/1976 (47 y.o. 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 Bathing/ Shower/ Hygiene May shower; gently cleanse wound with antibacterial soap, rinse and pat dry  prior to dressing wounds No tub bath. Off-Loading Total Contact Cast to Left Lower Extremity Open toe surgical shoe - right foot Wound Treatment Wound #12R - Foot Wound Laterality: Plantar, Left, Medial Cleanser: Vashe 5.8 (oz) Discharge Instructions: Use vashe 5.8 (oz) as directed Topical: Gentamicin Discharge Instructions: Apply as directed by provider. Prim Dressing: Hydrofera Blue Ready Transfer Foam, 2.5x2.5 (in/in) ary Discharge Instructions: Apply Hydrofera Blue Ready to wound bed as directed Secondary Dressing: ABD Pad 5x9 (in/in)  LAMIYAH, SCHLOTTER A ([id number]) [id number] Page 1 of 20 Visit Report for 08/07/2023 Chief Complaint Document Details Patient Name: Date of Service: ODYSSEY, VASBINDER 08/07/2023 12:45 PM Medical Record Number: [id number] Patient Account Number: [id number] Date of Birth/Sex: Treating RN: 24-Apr-1976 (47 y.o. [name] Primary Care Provider: [name] Other Clinician: [name] Referring Provider: Treating Provider/Extender: [name] Weeks in Treatment: 72 Information Obtained from: Patient Chief Complaint Left foot ulcer Electronic Signature(s) Signed: 08/07/2023 12:44:47 PM By: [name] PA-C Entered By: [name] on 08/07/2023 09:44:47 -------------------------------------------------------------------------------- HPI Details Patient Name: Date of Service: [name] 08/07/2023 12:45 PM Medical Record Number: [id number] Patient Account Number: [id number] Date of Birth/Sex: Treating RN: Mar 24, 1976 (47 y.o. [name] Primary Care Provider: [name] Other Clinician: [name] Referring Provider: Treating Provider/Extender: [name] in Treatment: 72 History of Present Illness HPI Description: 01/18/18-She is here for initial evaluation of the left great toe ulcer. She is a poor historian in regards to timeframe in detail. She states approximately 4 weeks ago she lacerated her toe on something in the house. She followed up with her primary care who placed her on Bactrim and ultimately a second dose of Bactrim prior to coming to wound clinic. She states she has been treating the toe with peroxide, Betadine and a Band-Aid. She did not check her blood sugar this morning but checked it yesterday morning it was 327; she is unaware of a recent A1c and there are no current records. She saw Dr. she would've orthopedics last week for an old injury to the left ankle, she states he did  not see her toe, nor did she bring it to his attention. She smokes approximately 1 pack cigarettes a day. Her social situation is concerning, she arrives this morning with her mother who appears extremely intoxicated/under the influence; her mother was asked to leave the room and be monitored by the patient's grandmother. The patient's aunt then accompanied the patient and the room throughout the rest of the appointment. We had a lengthy discussion regarding the deleterious effects of uncontrolled hyperglycemia and smoking as it relates to wound healing and overall health. She was strongly encouraged to decrease her smoking and get her diabetes under better control. She states she is currently on a diet and has cut down her [facility] consumption. The left toe is erythematous, macerated and slightly edematous with malodor present. The edema in her left foot is below her baseline, there is no erythema streaking. We will treat her with Santyl, doxycycline; we have ordered and xray, culture and provided a Peg assist surgical shoe and cultured the wound. 01/25/18-She is here in follow-up evaluation for a left great toe ulcer and presents with an abscess to her suprapubic area. She states her blood sugars remain elevated, feeling "sick" and if levels are below 250, but she is trying. She has made no attempt to decrease her smoking stating that we "can't take away her food in her cigarettes". She has been compliant with offloading using the PEG assist you. She is using Santyl daily. the culture obtained last week grew staph aureus and Enterococcus faecalis; continues on the doxycycline and Augmentin was added on Monday. The suprapubic area has erythema, no femoral variation, purple discoloration, minimal induration, was accessed with a cotton tip applicator with sanguinopurulent drainage, this was cultured, I suspect the DAYA, DUTT A ([id number]) [phone].pdf Page 2 of  20 current antibiotic treatment will cover and we  this is much better than last week no debridement even necessary today. We may have to perform some slight debridement come next week we will see how things go. Monitor for any signs of worsening overall and obviously if anything changes she should be letting us know. 08-01-2023 upon evaluation today patient appears to be doing excellent in regard to her feet. The right foot/heel area is completely healed she does have some callus buildup I am going to trim this down a little bit I do not want to build up to the point of cracking she is doing well with the urea cream. With regard to the left heel she is doing well with casting I am going to perform some debridement here to clear away before reapplying the cast and I think she is really doing quite well though. 08-07-2023 upon evaluation patient's wound actually showed signs of excellent improvement and actually very pleased with where we stand I do believe that she is making good headway towards complete closure which is great news. Electronic Signature(s) Signed: 08/07/2023 1:53:19 PM By: [name] PA-C Previous Signature: 08/07/2023 1:18:48 PM Version By: [name] PA-C Entered By: [name] on 08/07/2023 10:53:19 -------------------------------------------------------------------------------- Physical Exam Details Patient Name: Date of Service: KIDA, DIGIULIO 08/07/2023 12:45 PM Medical Record Number: [id number] Patient Account Number: [id number] Date of Birth/Sex: Treating RN: 08-19-1976 (47 y.o. [name] Primary Care Provider: [name] Other Clinician: [name] Referring Provider: Treating  Provider/Extender: [name] Weeks in Treatment: 24 Constitutional Well-nourished and well-hydrated in no acute distress. Respiratory normal breathing without difficulty. CANDANCE, BOHLMAN A ([id number]) [id number] Page 9 of 20 Psychiatric this patient is able to make decisions and demonstrates good insight into disease process. Alert and Oriented x 3. pleasant and cooperative. Notes Patient's wound bed showed signs of good granulation and epithelization at this point. Fortunately I think that she is doing well with a total contact casting working to continue as such with that and I did go ahead and reapply that here in the office today. Electronic Signature(s) Signed: 08/07/2023 1:53:36 PM By: [name] PA-C Entered By: [name] on 08/07/2023 10:53:36 -------------------------------------------------------------------------------- Physician Orders Details Patient Name: Date of Service: [name] 08/07/2023 12:45 PM Medical Record Number: [id number] Patient Account Number: [id number] Date of Birth/Sex: Treating RN: 02/05/1976 (47 y.o. 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 Bathing/ Shower/ Hygiene May shower; gently cleanse wound with antibacterial soap, rinse and pat dry  prior to dressing wounds No tub bath. Off-Loading Total Contact Cast to Left Lower Extremity Open toe surgical shoe - right foot Wound Treatment Wound #12R - Foot Wound Laterality: Plantar, Left, Medial Cleanser: Vashe 5.8 (oz) Discharge Instructions: Use vashe 5.8 (oz) as directed Topical: Gentamicin Discharge Instructions: Apply as directed by provider. Prim Dressing: Hydrofera Blue Ready Transfer Foam, 2.5x2.5 (in/in) ary Discharge Instructions: Apply Hydrofera Blue Ready to wound bed as directed Secondary Dressing: ABD Pad 5x9 (in/in)  Type 2 diabetes mellitus with diabetic polyneuropathy Long term (current) use of insulin End stage renal disease Dependence on renal dialysis Corns and callosities Procedures Wound #12R Pre-procedure diagnosis of Wound #12R is a Diabetic Wound/Ulcer of the Lower Extremity located on the Left,Medial,Plantar Foot . There was a T Contact otal Cast Procedure by [name], PA-CMaryclare Bean, Carmin A ([id number]) [id number] Page 19 of 20 Post procedure Diagnosis Wound #12R: Same as Pre-Procedure Plan Follow-up Appointments: Return Appointment in 1 week. Bathing/ Shower/ Hygiene: May shower; gently cleanse wound with antibacterial soap, rinse and pat dry prior to dressing wounds No tub bath. Off-Loading: T Contact Cast to Left Lower Extremity otal Open toe surgical shoe - right foot WOUND #12R: - Foot Wound Laterality: Plantar, Left, Medial Cleanser: Vashe 5.8 (oz) Discharge Instructions: Use vashe 5.8 (oz) as directed Topical: Gentamicin Discharge Instructions: Apply as directed by  provider. Prim Dressing: Hydrofera Blue Ready Transfer Foam, 2.5x2.5 (in/in) ary Discharge Instructions: Apply Hydrofera Blue Ready to wound bed as directed Secondary Dressing: ABD Pad 5x9 (in/in) Discharge Instructions: Cover with ABD pad 1. I am going to recommend that we continue with a total contact cast which does seem to be doing a great job here. 2. Working to continue as well with the [employer]	[place] which I think is doing excellent. We will see patient back for reevaluation in 1 week here in the clinic. If anything worsens or changes patient will contact our office for additional recommendations. Electronic Signature(s) Signed: 08/07/2023 1:53:55 PM By: [name] PA-C Entered By: [name] on 08/07/2023 10:53:55 -------------------------------------------------------------------------------- Total Contact Cast Details Patient Name: Date of Service: TITILAYO, HAGANS 08/07/2023 12:45 PM Medical Record Number: [id number] Patient Account Number: [id number] Date of Birth/Sex: Treating RN: 1976-01-15 (47 y.o. [name] Primary Care Provider: [name] Other Clinician: [name] Referring Provider: Treating Provider/Extender: [name] Weeks in Treatment: 72 T Contact Cast Applied for Wound Assessment: otal Wound #12R Left,Medial,Plantar Foot Performed By: Physician [name], PA-C Post Procedure Diagnosis Same as Pre-procedure Electronic Signature(s) Signed: 08/07/2023 4:48:00 PM By: [name] PA-C Signed: 08/08/2023 4:35:53 PM By: [name] Entered By: [name] on 08/07/2023 10:21:52 Earmon Phoenix A ([id number]) [id number] Page 20 of 20 -------------------------------------------------------------------------------- SuperBill Details Patient Name: Date of Service: AVICE, FUNCHESS 08/07/2023 Medical Record Number: [id number] Patient Account Number: [id number] Date of Birth/Sex: Treating  RN: 08/09/1976 (47 y.o. 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.42 Type 2 diabetes mellitus with diabetic polyneuropathy Z79.4 Long term (current) use of insulin N18.6 End stage renal disease Z99.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 L84 Corns and callosities Facility Procedures : CPT4 Code: [id number] Description: 29445 - APPLY TOTAL CONTACT LEG CAST ICD-10 Diagnosis Description L97.528 Non-pressure chronic ulcer of other part of left foot with other specified sever Modifier: ity Quantity: 1 Physician Procedures : CPT4 Code Description Modifier [id number] - WC PHYS APPLY TOTAL CONTACT CAST ICD-10 Diagnosis Description L97.528 Non-pressure chronic ulcer of other part of left foot with other specified severity Quantity: 1 Electronic Signature(s) Signed: 08/07/2023 1:54:31 PM By: [name] PA-C Previous Signature: 08/07/2023 1:54:08 PM Version By: [name] PA-C Entered By: [name] on 08/07/2023 10:54:31

## 2023-08-07 NOTE — Progress Notes (Addendum)
Brachial Index (ABI) - do not check if billed separately Has the patient been seen at the hospital within the last three years: Yes Total Score: 0 Level Of Care: ____ Electronic Signature(s) Signed: 08/08/2023 4:35:53 PM By: [name] Entered By: [name] on 08/07/2023 10:22:34 -------------------------------------------------------------------------------- Encounter Discharge Information Details Patient Name: Date of Service: [name] 08/07/2023 12:45 PM Medical Record Number: [id number] Patient Account Number: [id number] Date of Birth/Sex: Treating RN: 04/08/1976 (47 y.o. [name], [name], [name] [id number] Page 3 of 8 Primary Care [name]: [name] Other Clinician: [name] Referring Muaz Shorey: Treating [name]/Extender: [name] in Treatment: 72 Encounter Discharge Information Items Discharge Condition: Stable Ambulatory Status: Wheelchair Discharge Destination: Home Transportation: Private Auto Accompanied By: mother Schedule Follow-up Appointment: Yes Clinical Summary of Care: Electronic Signature(s) Signed: 08/08/2023 4:35:53 PM By: [name] Entered By: [name] on 08/07/2023 10:52:08 -------------------------------------------------------------------------------- Lower Extremity Assessment Details Patient Name: Date of Service: [name], [name] 08/07/2023 12:45 PM Medical Record Number: [id number] Patient Account Number: [id number] Date of Birth/Sex: Treating RN: May 01, 1976 (47 y.o. [name] Primary Care Brenin Heidelberger: [name] Other Clinician: [name] Referring Tajana Crotteau: Treating Rasaan Brotherton/Extender: [name] Weeks in Treatment:  72 Electronic Signature(s) Signed: 08/08/2023 4:35:53 PM By: [name] Signed: 08/11/2023 11:41:18 AM By: Yevonne Pax RN Entered By: [name] on 08/07/2023 10:09:33 -------------------------------------------------------------------------------- Multi Wound Chart Details Patient Name: Date of Service: [name] 08/07/2023 12:45 PM Medical Record Number: [id number] Patient Account Number: [id number] Date of Birth/Sex: Treating RN: January 02, 1976 (47 y.o. 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 Primary Etiology: Extremity Chronic sinus problems/congestion, N/[name] N/[name] Comorbid History: Middle ear problems, Anemia, Chronic Obstructive Pulmonary Disease (COPD), Congestive Heart Failure, Type II Diabetes, End Stage Renal Disease, History of pressure wounds, Neuropathy 03/16/2020 N/[name] N/[name] Date Acquired: 98 N/[name] N/[name] Weeks of Treatment: Open N/[name] N/[name] Wound Status: Yes N/[name] N/[name] Wound Recurrence: 0.4x0.3x0.1 N/[name] N/[name] Measurements L x W x D (cm) 0.094 N/[name] N/[name] [name] (cm) : rea 0.009 N/[name] N/[name] Volume (cm) : 98.00% N/[name] N/[name] % Reduction in [name] rea: 99.00% N/[name] N/[name] % Reduction in Volume: Grade 3 N/[name] N/[name] Classification: Medium N/[name] N/[name] Exudate [name] mount: Serosanguineous N/[name] N/[name] Exudate Type: red, brown N/[name] N/[name] Exudate Color: Flat and Intact N/[name] N/[name] Wound Margin: None Present (0%) N/[name] N/[name] Granulation [name] mount: None Present (0%)  N/[name] N/[name] Necrotic [name] mount: Fat Layer (Subcutaneous Tissue): Yes N/[name] N/[name] Exposed Structures: Fascia: No Tendon: No Muscle: No Joint: No Bone: No Large (67-100%) N/[name] N/[name] Epithelialization: Treatment Notes Electronic Signature(s) Signed: 08/08/2023 4:35:53 PM By: [name] Entered By: [name] on 08/07/2023 10:09:41 -------------------------------------------------------------------------------- Multi-Disciplinary Care Plan Details Patient Name: Date of Service: [name] 08/07/2023 12:45 PM Medical Record Number: [id number] Patient Account Number: [id number] Date of Birth/Sex: Treating RN: May 02, 1976 (47 y.o. [name] Primary Care Esmay Amspacher: [name] Other Clinician: [name] Referring Rhena Glace: Treating Pernell Dikes/Extender: [name] in Treatment: [address] SHAILYN, HAYENGA [name] ([id number]) [id number] Page 5 of 8 Abuse / Safety /  [name], [name] ([id number]) [id number] Page 1 of 8 Visit Report for 08/07/2023 Arrival Information Details Patient Name: Date of Service: [name], [name] 08/07/2023 12:45 PM Medical Record Number: [id number] Patient Account Number: [id number] Date of Birth/Sex: Treating RN: [birth year] (47 y.o. [name] Primary Care Yunior Jain: [name] Other Clinician: [name] Referring Sylvestre Rathgeber: Treating Johnathon Mittal/Extender: [name] in Treatment: 72 Visit Information History Since Last Visit All ordered tests and consults were completed: No Patient Arrived: Wheel Chair Added or deleted any medications: No Arrival Time: 12:49 Any new allergies or adverse reactions: No Transfer Assistance: EasyPivot Patient Lift Had [name] fall or experienced change in No Patient Identification Verified: Yes activities of daily living that may affect Secondary Verification Process Completed: Yes risk of falls: Patient Requires Transmission-Based Precautions: No Signs or symptoms of abuse/neglect since last visito No Patient Has Alerts: No Hospitalized since last visit: No Implantable device outside of the clinic excluding No cellular tissue based products placed in the center since last visit: Has Dressing in Place as Prescribed: Yes Has Footwear/Offloading in Place as Prescribed: Yes Left: T Contact Cast otal Pain Present Now: No Electronic Signature(s) Signed: 08/08/2023 4:35:53 PM By: [name] Entered By: [name] on 08/07/2023 09:53:28 -------------------------------------------------------------------------------- Clinic Level of Care Assessment Details Patient Name: Date of Service: [name], [name] 08/07/2023 12:45 PM Medical Record Number: [id number] Patient Account Number: [id number] Date of Birth/Sex: Treating RN: 03/18/1976 (47 y.o. [name] Primary Care [name]: [name] Other Clinician: [name] Referring Cypress Fanfan: Treating Messiah Ahr/Extender: [name] in Treatment: 72 Clinic Level of Care Assessment Items TOOL 1 Quantity Score []  - 0 Use when EandM and Procedure is performed on INITIAL visit ASSESSMENTS - Nursing Assessment / Reassessment []  - 0 General Physical Exam (combine w/ comprehensive assessment (listed just below) when performed on new pt. evalsMAKENIZE, [name] ([id number]) [id number] Page 2 of 8 []  - 0 Comprehensive Assessment (HX, ROS, Risk Assessments, Wounds Hx, etc.) ASSESSMENTS - Wound and Skin Assessment / Reassessment []  - 0 Dermatologic / Skin Assessment (not related to wound area) ASSESSMENTS - Ostomy and/or Continence Assessment and Care []  - 0 Incontinence Assessment and Management []  - 0 Ostomy Care Assessment and Management (repouching, etc.) PROCESS - Coordination of Care []  - 0 Simple Patient / Family Education for ongoing care []  - 0 Complex (extensive) Patient / Family Education for ongoing care []  - 0 Staff obtains Chiropractor, Records, T Results / Process Orders est []  - 0 Staff telephones HHA, Nursing Homes / Clarify orders / etc []  - 0 Routine Transfer to another Facility (non-emergent condition) []  - 0 Routine Hospital Admission (non-emergent condition) []  - 0 New Admissions / Manufacturing engineer / Ordering NPWT Apligraf, etc. , []  - 0 Emergency Hospital Admission (emergent condition) PROCESS - Special Needs []  - 0 Pediatric / Minor Patient Management []  - 0 Isolation Patient Management []  - 0 Hearing / Language / Visual special needs []  - 0 Assessment of Community assistance (transportation, D/C planning, etc.) []  - 0 Additional assistance / Altered mentation []  - 0 Support Surface(s) Assessment (bed, cushion, seat, etc.) INTERVENTIONS - Miscellaneous []  - 0 External ear exam []  - 0 Patient Transfer (multiple staff / Nurse, adult / Similar devices) []  -  0 Simple Staple / Suture removal (25 or less) []  - 0 Complex Staple / Suture removal (26 or more) []  - 0 Hypo/Hyperglycemic Management (do not check if billed separately) []  - 0 Ankle /  Primary Dressing Hydrofera Blue Ready Transfer  Foam, 2.5x2.5 (in/in) Discharge Instruction: Apply Hydrofera Blue Ready to wound bed as directed Secondary Dressing ABD Pad 5x9 (in/in) Discharge Instruction: Cover with ABD pad Secured With Compression Wrap Compression Stockings Add-Ons Electronic Signature(s) Signed: 08/08/2023 4:35:53 PM By: [name] Signed: 08/11/2023 11:41:18 AM By: Yevonne Pax RN Entered By: [name] on 08/07/2023 10:09:12 -------------------------------------------------------------------------------- Vitals Details Patient Name: Date of Service: [name] 08/07/2023 12:45 PM Medical Record Number: [id number] Patient Account Number: [id number] Date of Birth/Sex: Treating RN: February 07, 1976 (47 y.o. [name] Primary Care Lavalle Skoda: [name] Other Clinician: [name] Referring Mliss Wedin: Treating Souleymane Saiki/Extender: [name] in Treatment: 72 Vital Signs Time Taken: 12:53 Temperature (F): 98.6 Height (in): 69 Pulse (bpm): 105 Weight (lbs): 178 Respiratory Rate (breaths/min): 16 Body Mass Index (BMI): 26.3 Blood Pressure (mmHg): 152/80 Reference Range: 80 - 120 mg / dl Electronic Signature(s) Signed: 08/08/2023 4:35:53 PM By: [name] Entered By: [name] on 08/07/2023 09:55:36  [name], [name] ([id number]) [id number] Page 1 of 8 Visit Report for 08/07/2023 Arrival Information Details Patient Name: Date of Service: [name], [name] 08/07/2023 12:45 PM Medical Record Number: [id number] Patient Account Number: [id number] Date of Birth/Sex: Treating RN: [birth year] (47 y.o. [name] Primary Care Yunior Jain: [name] Other Clinician: [name] Referring Sylvestre Rathgeber: Treating Johnathon Mittal/Extender: [name] in Treatment: 72 Visit Information History Since Last Visit All ordered tests and consults were completed: No Patient Arrived: Wheel Chair Added or deleted any medications: No Arrival Time: 12:49 Any new allergies or adverse reactions: No Transfer Assistance: EasyPivot Patient Lift Had [name] fall or experienced change in No Patient Identification Verified: Yes activities of daily living that may affect Secondary Verification Process Completed: Yes risk of falls: Patient Requires Transmission-Based Precautions: No Signs or symptoms of abuse/neglect since last visito No Patient Has Alerts: No Hospitalized since last visit: No Implantable device outside of the clinic excluding No cellular tissue based products placed in the center since last visit: Has Dressing in Place as Prescribed: Yes Has Footwear/Offloading in Place as Prescribed: Yes Left: T Contact Cast otal Pain Present Now: No Electronic Signature(s) Signed: 08/08/2023 4:35:53 PM By: [name] Entered By: [name] on 08/07/2023 09:53:28 -------------------------------------------------------------------------------- Clinic Level of Care Assessment Details Patient Name: Date of Service: [name], [name] 08/07/2023 12:45 PM Medical Record Number: [id number] Patient Account Number: [id number] Date of Birth/Sex: Treating RN: 03/18/1976 (47 y.o. [name] Primary Care [name]: [name] Other Clinician: [name] Referring Cypress Fanfan: Treating Messiah Ahr/Extender: [name] in Treatment: 72 Clinic Level of Care Assessment Items TOOL 1 Quantity Score []  - 0 Use when EandM and Procedure is performed on INITIAL visit ASSESSMENTS - Nursing Assessment / Reassessment []  - 0 General Physical Exam (combine w/ comprehensive assessment (listed just below) when performed on new pt. evalsMAKENIZE, [name] ([id number]) [id number] Page 2 of 8 []  - 0 Comprehensive Assessment (HX, ROS, Risk Assessments, Wounds Hx, etc.) ASSESSMENTS - Wound and Skin Assessment / Reassessment []  - 0 Dermatologic / Skin Assessment (not related to wound area) ASSESSMENTS - Ostomy and/or Continence Assessment and Care []  - 0 Incontinence Assessment and Management []  - 0 Ostomy Care Assessment and Management (repouching, etc.) PROCESS - Coordination of Care []  - 0 Simple Patient / Family Education for ongoing care []  - 0 Complex (extensive) Patient / Family Education for ongoing care []  - 0 Staff obtains Chiropractor, Records, T Results / Process Orders est []  - 0 Staff telephones HHA, Nursing Homes / Clarify orders / etc []  - 0 Routine Transfer to another Facility (non-emergent condition) []  - 0 Routine Hospital Admission (non-emergent condition) []  - 0 New Admissions / Manufacturing engineer / Ordering NPWT Apligraf, etc. , []  - 0 Emergency Hospital Admission (emergent condition) PROCESS - Special Needs []  - 0 Pediatric / Minor Patient Management []  - 0 Isolation Patient Management []  - 0 Hearing / Language / Visual special needs []  - 0 Assessment of Community assistance (transportation, D/C planning, etc.) []  - 0 Additional assistance / Altered mentation []  - 0 Support Surface(s) Assessment (bed, cushion, seat, etc.) INTERVENTIONS - Miscellaneous []  - 0 External ear exam []  - 0 Patient Transfer (multiple staff / Nurse, adult / Similar devices) []  -  0 Simple Staple / Suture removal (25 or less) []  - 0 Complex Staple / Suture removal (26 or more) []  - 0 Hypo/Hyperglycemic Management (do not check if billed separately) []  - 0 Ankle /

## 2023-08-10 ENCOUNTER — Encounter: Payer: MEDICAID | Admitting: Physician Assistant

## 2023-08-14 ENCOUNTER — Encounter: Payer: MEDICAID | Admitting: Physician Assistant

## 2023-08-14 DIAGNOSIS — I5022 Chronic systolic (congestive) heart failure: Secondary | ICD-10-CM | POA: Diagnosis not present

## 2023-08-14 NOTE — Progress Notes (Addendum)
Ag to the right foot wound. Antibiotic ointment to the right anterior leg wound. She completed her oral antibiotics. She has no issues or complaints today. Wounds continue to improve albeit slowly. 8/14; 8/14; patient has 3 wounds 1 on the left plantar heel 1 on the right plantar heel and a small wound on the right anterior lower leg. We have been using Hydrofera Blue in the left heel Aquacel on the right heel. The left heel has been in a total contact cast 8/21; patient presents for follow-up. We have been using Hydrofera Blue to the left foot wound under the total contact cast. T the right heel patient has been o using Aquacel Ag. T the anterior leg wound antibiotic ointment. All wounds have healed. o 8/28; patient presents for follow-up. She has been using her surgical shoe with offloading felt pads. Her wounds have remained closed. 9/11; patient presents for final follow-up to assure that her wounds have remained closed. Unfortunately the left foot wound has opened again. But she has been wearing her Surgical shoe for offloading with felt pad. 07-14-2023 upon  evaluation today patient appears to be doing well currently in regard to her wound. She has been tolerating the dressing changes without complication with total contact casting. I am going to be helping take care of her at this point as she is transferring from Dr. Mikey Bussing to me since Dr. Mikey Bussing is on maternity leave. 07-24-2023 upon evaluation today patient appears to be doing decently well currently in regard to her wound. She is actually showing signs of improvement in regard to the left foot and I feel like this is much better than last week no debridement even necessary today. We may have to perform some slight debridement come next week we will see how things go. Monitor for any signs of worsening overall and obviously if anything changes she should be letting us know. 08-01-2023 upon evaluation today patient appears to be doing excellent in regard to her feet. The right foot/heel area is completely healed she does have some callus buildup I am going to trim this down a little bit I do not want to build up to the point of cracking she is doing well with the urea cream. With regard to the left heel she is doing well with casting I am going to perform some debridement here to clear away before reapplying the cast and I think she is really doing quite well though. 08-07-2023 upon evaluation patient's wound actually showed signs of excellent improvement and actually very pleased with where we stand I do believe that she is making good headway towards complete closure which is great news. 08-14-2023 upon evaluation today patient appears to be doing well currently in regard to her wound. She actually is going require a little bit of debridement but fortunately nothing too significant today actually feel like she is doing much better. This is mainly to remove callus on the little slough and biofilm down to good subcutaneous tissue. In general I think that she is making excellent headway  here. Electronic Signature(s) Signed: 08/14/2023 1:31:11 PM By: [name] PA-C Entered By: [name] on 08/14/2023 10:31:11 -------------------------------------------------------------------------------- Physical Exam Details Patient Name: Date of Service: KISHARA, LOVELAND 08/14/2023 12:45 PM Medical Record Number: [id number] Patient Account Number: [id number] Date of Birth/Sex: Treating RN: [birth year] (47 y.o. Ginette Pitman Primary Care Provider: [name] Other Clinician: [name] Referring Provider: Treating Provider/Extender: [name] Weeks in Treatment: 81 Constitutional Well-nourished and  KATHYJO, SOLES A ([id number]) [id number] Page 1 of 21 Visit Report for 08/14/2023 Chief Complaint Document Details Patient Name: Date of Service: MALEIYAH, VISCARRA 08/14/2023 12:45 PM Medical Record Number: [id number] Patient Account Number: [id number] Date of Birth/Sex: Treating RN: [birth year] (47 y.o. Ginette Pitman Primary Care Provider: [name] Other Clinician: [name] Referring Provider: Treating Provider/Extender: [name] Weeks in Treatment: 13 Information Obtained from: Patient Chief Complaint Left foot ulcer Electronic Signature(s) Signed: 08/14/2023 1:17:49 PM By: [name] PA-C Entered By: [name] on 08/14/2023 10:17:49 -------------------------------------------------------------------------------- Debridement Details Patient Name: Date of Service: [name] 08/14/2023 12:45 PM Medical Record Number: [id number] Patient Account Number: [id number] Date of Birth/Sex: Treating RN: 07/18/1976 (47 y.o. Ginette Pitman Primary Care Provider: [name] Other Clinician: [name] Referring Provider: Treating Provider/Extender: [name] Weeks in Treatment: 73 Debridement Performed for Assessment: Wound #12R Left,Medial,Plantar Foot Performed By: Physician [name], PA-C The following information was scribed by: [name] The information was scribed for: [name] Debridement Type: Debridement Severity of Tissue Pre Debridement: Fat layer exposed Level of Consciousness (Pre-procedure): Awake and Alert Pre-procedure Verification/Time Out Yes - 13:20 Taken: Start Time: 13:20 Percent of Wound Bed Debrided: 100% T Area Debrided (cm): otal 0.07 Tissue and other material debrided: Viable, Non-Viable, Callus, Slough, Subcutaneous, Slough Level: Skin/Subcutaneous Tissue Debridement Description: Excisional Instrument: Curette Bleeding: Minimum Hemostasis Achieved:  Pressure Nuzum, Clemie A ([id number]) [id number].pdf Page 2 of 21 Response to Treatment: Procedure was tolerated well Level of Consciousness (Post- Awake and Alert procedure): Post Debridement Measurements of Total Wound Length: (cm) 0.3 Width: (cm) 0.3 Depth: (cm) 0.1 Volume: (cm) 0.007 Character of Wound/Ulcer Post Debridement: Stable Severity of Tissue Post Debridement: Fat layer exposed Post Procedure Diagnosis Same as Pre-procedure Electronic Signature(s) Signed: 08/17/2023 6:30:03 PM By: [name] PA-C Signed: 08/18/2023 10:28:30 AM By: [name] Signed: 08/18/2023 1:46:08 PM By: Midge Aver MSN RN CNS WTA Previous Signature: 08/14/2023 4:50:43 PM Version By: [name] PA-C Previous Signature: 08/14/2023 5:06:23 PM Version By: Midge Aver MSN RN CNS WTA Previous Signature: 08/15/2023 4:53:28 PM Version By: [name] Entered By: [name] on 08/17/2023 14:06:45 -------------------------------------------------------------------------------- HPI Details Patient Name: Date of Service: GLORA, JEANPHILIPPE A. 08/14/2023 12:45 PM Medical Record Number: [id number] Patient Account Number: [id number] Date of Birth/Sex: Treating RN: July 28, 1976 (47 y.o. Ginette Pitman Primary Care Provider: [name] Other Clinician: [name] Referring Provider: Treating Provider/Extender: [name] in Treatment: 76 History of Present Illness HPI Description: 01/18/18-She is here for initial evaluation of the left great toe ulcer. She is a poor historian in regards to timeframe in detail. She states approximately 4 weeks ago she lacerated her toe on something in the house. She followed up with her primary care who placed her on Bactrim and ultimately a second dose of Bactrim prior to coming to wound clinic. She states she has been treating the toe with peroxide, Betadine and a Band-Aid. She did not check her blood sugar this  morning but checked it yesterday morning it was 327; she is unaware of a recent A1c and there are no current records. She saw Dr. she would've orthopedics last week for an old injury to the left ankle, she states he did not see her toe, nor did she bring it to his attention. She smokes approximately 1 pack cigarettes a day. Her social situation is concerning, she arrives this morning with her mother who appears extremely intoxicated/under the influence;  Minette A ([id number]) [id number] Page 20 of 21 Post procedure Diagnosis Wound #12R: Same as Pre-Procedure Pre-procedure diagnosis of Wound #12R is a Diabetic Wound/Ulcer of the Lower Extremity located on the Left,Medial,Plantar Foot . There was a T Contact otal Cast Procedure by [name], PA-C. Post procedure Diagnosis Wound #12R: Same as Pre-Procedure Plan Follow-up Appointments: Return Appointment in 1 week. Bathing/ Shower/ Hygiene: May shower; gently cleanse wound with antibacterial soap, rinse and pat dry prior to dressing wounds No tub bath. Off-Loading: T Contact  Cast to Left Lower Extremity otal Open toe surgical shoe - right foot WOUND #12R: - Foot Wound Laterality: Plantar, Left, Medial Cleanser: Vashe 5.8 (oz) Discharge Instructions: Use vashe 5.8 (oz) as directed Topical: Gentamicin Discharge Instructions: Apply as directed by provider. Prim Dressing: Hydrofera Blue Ready Transfer Foam, 2.5x2.5 (in/in) ary Discharge Instructions: Apply Hydrofera Blue Ready to wound bed as directed Secondary Dressing: ABD Pad 5x9 (in/in) Discharge Instructions: Cover with ABD pad 1. I am going to recommend that the patient should continue with the total contact casting along with the [facility] Blue and mupirocin. 2. I did go ahead and reapply the cast myself today as well and patient tolerated that without complication. We will see patient back for reevaluation in 1 week here in the clinic. If anything worsens or changes patient will contact our office for additional recommendations. Electronic Signature(s) Signed: 08/14/2023 1:31:42 PM By: [name] PA-C Entered By: [name] on 08/14/2023 10:31:42 -------------------------------------------------------------------------------- Total Contact Cast Details Patient Name: Date of Service: ANALIESE, DEMARTINI 08/14/2023 12:45 PM Medical Record Number: [id number] Patient Account Number: [id number] Date of Birth/Sex: Treating RN: 01/22/1976 (48 y.o. Ginette Pitman Primary Care Provider: [name] Other Clinician: [name] Referring Provider: Treating Provider/Extender: [name] Weeks in Treatment: 10 T Contact Cast Applied for Wound Assessment: otal Wound #12R Left,Medial,Plantar Foot Performed By: Physician [name], PA-C Post Procedure Diagnosis Same as Pre-procedure Electronic Signature(s) Signed: 08/14/2023 4:50:43 PM By: [name] PA-C Signed: 08/15/2023 4:53:28 PM By: [name] Entered By: [name] on 08/14/2023 10:24:25 Earmon Phoenix A ([id number])  [id number] Page 21 of 21 -------------------------------------------------------------------------------- SuperBill Details Patient Name: Date of Service: SINIAH, DAMICO 08/14/2023 Medical Record Number: [id number] Patient Account Number: [id number] Date of Birth/Sex: Treating RN: 03-Feb-1976 (47 y.o. 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 Z79.4 Long term (current) use of insulin N18.6 End stage renal disease Z99.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 L84 Corns and callosities Facility Procedures : CPT4 Code: 35009381 Description: 11042 - DEB SUBQ TISSUE 20 SQ CM/< ICD-10 Diagnosis Description L97.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 Non-pressure chronic ulcer of other part of left foot with other specified seve Modifier: rity Quantity: 1 Physician Procedures : CPT4 Code Description Modifier [id number] 11042 - WC PHYS SUBQ TISS 20 SQ CM ICD-10 Diagnosis Description L97.528 Non-pressure chronic ulcer of other part of left foot with other specified severity Quantity: 1 Electronic Signature(s) Signed: 08/14/2023 1:32:06 PM By: [name] PA-C Entered By: [name] on 08/14/2023 10:32:06  03/16/2022 03/16/2022 W54.0XXA Bitten by dog, initial encounter 05/17/2023 05/17/2023 S81.801A Unspecified open wound, right lower leg, initial encounter 05/17/2023 05/17/2023 SHANIN, PARRILLI A ([id number]) [phone].pdf Page 12 of 21 E11.622 Type 2 diabetes mellitus with other skin ulcer 05/17/2023 05/17/2023 Electronic Signature(s) Signed: 08/14/2023 1:17:41 PM By: [name] PA-C Entered By: [name] on 08/14/2023 10:17:41 -------------------------------------------------------------------------------- Progress Note Details Patient Name: Date of Service: JOHNAE, IANNONE 08/14/2023 12:45 PM Medical Record Number: [id number] Patient Account Number: [id number] Date of Birth/Sex: Treating RN: February 04, 1976 (47 y.o. Ginette Pitman Primary Care Provider: [name] Other  Clinician: [name] Referring Provider: Treating Provider/Extender: [name] in Treatment: 38 Subjective Chief Complaint Information obtained from Patient Left foot ulcer History of Present Illness (HPI) 01/18/18-She is here for initial evaluation of the left great toe ulcer. She is a poor historian in regards to timeframe in detail. She states approximately 4 weeks ago she lacerated her toe on something in the house. She followed up with her primary care who placed her on Bactrim and ultimately a second dose of Bactrim prior to coming to wound clinic. She states she has been treating the toe with peroxide, Betadine and a Band-Aid. She did not check her blood sugar this morning but checked it yesterday morning it was 327; she is unaware of a recent A1c and there are no current records. She saw Dr. she would've orthopedics last week for an old injury to the left ankle, she states he did not see her toe, nor did she bring it to his attention. She smokes approximately 1 pack cigarettes a day. Her social situation is concerning, she arrives this morning with her mother who appears extremely intoxicated/under the influence; her mother was asked to leave the room and be monitored by the patient's grandmother. The patient's aunt then accompanied the patient and the room throughout the rest of the appointment. We had a lengthy discussion regarding the deleterious effects of uncontrolled hyperglycemia and smoking as it relates to wound healing and overall health. She was strongly encouraged to decrease her smoking and get her diabetes under better control. She states she is currently on a diet and has cut down her [facility] consumption. The left toe is erythematous, macerated and slightly edematous with malodor present. The edema in her left foot is below her baseline, there is no erythema streaking. We will treat her with Santyl, doxycycline; we have ordered and xray,  culture and provided a Peg assist surgical shoe and cultured the wound. 01/25/18-She is here in follow-up evaluation for a left great toe ulcer and presents with an abscess to her suprapubic area. She states her blood sugars remain elevated, feeling "sick" and if levels are below 250, but she is trying. She has made no attempt to decrease her smoking stating that we "can't take away her food in her cigarettes". She has been compliant with offloading using the PEG assist you. She is using Santyl daily. the culture obtained last week grew staph aureus and Enterococcus faecalis; continues on the doxycycline and Augmentin was added on Monday. The suprapubic area has erythema, no femoral variation, purple discoloration, minimal induration, was accessed with a cotton tip applicator with sanguinopurulent drainage, this was cultured, I suspect the current antibiotic treatment will cover and we will not add anything to her current treatment plan. She was advised to go to urgent care or ER with any change in redness, induration or fever. 02/01/18-She is here in follow-up evaluation for left great  Ag to the right foot wound. Antibiotic ointment to the right anterior leg wound. She completed her oral antibiotics. She has no issues or complaints today. Wounds continue to improve albeit slowly. 8/14; 8/14; patient has 3 wounds 1 on the left plantar heel 1 on the right plantar heel and a small wound on the right anterior lower leg. We have been using Hydrofera Blue in the left heel Aquacel on the right heel. The left heel has been in a total contact cast 8/21; patient presents for follow-up. We have been using Hydrofera Blue to the left foot wound under the total contact cast. T the right heel patient has been o using Aquacel Ag. T the anterior leg wound antibiotic ointment. All wounds have healed. o 8/28; patient presents for follow-up. She has been using her surgical shoe with offloading felt pads. Her wounds have remained closed. 9/11; patient presents for final follow-up to assure that her wounds have remained closed. Unfortunately the left foot wound has opened again. But she has been wearing her Surgical shoe for offloading with felt pad. 07-14-2023 upon  evaluation today patient appears to be doing well currently in regard to her wound. She has been tolerating the dressing changes without complication with total contact casting. I am going to be helping take care of her at this point as she is transferring from Dr. Mikey Bussing to me since Dr. Mikey Bussing is on maternity leave. 07-24-2023 upon evaluation today patient appears to be doing decently well currently in regard to her wound. She is actually showing signs of improvement in regard to the left foot and I feel like this is much better than last week no debridement even necessary today. We may have to perform some slight debridement come next week we will see how things go. Monitor for any signs of worsening overall and obviously if anything changes she should be letting us know. 08-01-2023 upon evaluation today patient appears to be doing excellent in regard to her feet. The right foot/heel area is completely healed she does have some callus buildup I am going to trim this down a little bit I do not want to build up to the point of cracking she is doing well with the urea cream. With regard to the left heel she is doing well with casting I am going to perform some debridement here to clear away before reapplying the cast and I think she is really doing quite well though. 08-07-2023 upon evaluation patient's wound actually showed signs of excellent improvement and actually very pleased with where we stand I do believe that she is making good headway towards complete closure which is great news. 08-14-2023 upon evaluation today patient appears to be doing well currently in regard to her wound. She actually is going require a little bit of debridement but fortunately nothing too significant today actually feel like she is doing much better. This is mainly to remove callus on the little slough and biofilm down to good subcutaneous tissue. In general I think that she is making excellent headway  here. Electronic Signature(s) Signed: 08/14/2023 1:31:11 PM By: [name] PA-C Entered By: [name] on 08/14/2023 10:31:11 -------------------------------------------------------------------------------- Physical Exam Details Patient Name: Date of Service: KISHARA, LOVELAND 08/14/2023 12:45 PM Medical Record Number: [id number] Patient Account Number: [id number] Date of Birth/Sex: Treating RN: [birth year] (47 y.o. Ginette Pitman Primary Care Provider: [name] Other Clinician: [name] Referring Provider: Treating Provider/Extender: [name] Weeks in Treatment: 81 Constitutional Well-nourished and  Ag to the right foot wound. Antibiotic ointment to the right anterior leg wound. She completed her oral antibiotics. She has no issues or complaints today. Wounds continue to improve albeit slowly. 8/14; 8/14; patient has 3 wounds 1 on the left plantar heel 1 on the right plantar heel and a small wound on the right anterior lower leg. We have been using Hydrofera Blue in the left heel Aquacel on the right heel. The left heel has been in a total contact cast 8/21; patient presents for follow-up. We have been using Hydrofera Blue to the left foot wound under the total contact cast. T the right heel patient has been o using Aquacel Ag. T the anterior leg wound antibiotic ointment. All wounds have healed. o 8/28; patient presents for follow-up. She has been using her surgical shoe with offloading felt pads. Her wounds have remained closed. 9/11; patient presents for final follow-up to assure that her wounds have remained closed. Unfortunately the left foot wound has opened again. But she has been wearing her Surgical shoe for offloading with felt pad. 07-14-2023 upon  evaluation today patient appears to be doing well currently in regard to her wound. She has been tolerating the dressing changes without complication with total contact casting. I am going to be helping take care of her at this point as she is transferring from Dr. Mikey Bussing to me since Dr. Mikey Bussing is on maternity leave. 07-24-2023 upon evaluation today patient appears to be doing decently well currently in regard to her wound. She is actually showing signs of improvement in regard to the left foot and I feel like this is much better than last week no debridement even necessary today. We may have to perform some slight debridement come next week we will see how things go. Monitor for any signs of worsening overall and obviously if anything changes she should be letting us know. 08-01-2023 upon evaluation today patient appears to be doing excellent in regard to her feet. The right foot/heel area is completely healed she does have some callus buildup I am going to trim this down a little bit I do not want to build up to the point of cracking she is doing well with the urea cream. With regard to the left heel she is doing well with casting I am going to perform some debridement here to clear away before reapplying the cast and I think she is really doing quite well though. 08-07-2023 upon evaluation patient's wound actually showed signs of excellent improvement and actually very pleased with where we stand I do believe that she is making good headway towards complete closure which is great news. 08-14-2023 upon evaluation today patient appears to be doing well currently in regard to her wound. She actually is going require a little bit of debridement but fortunately nothing too significant today actually feel like she is doing much better. This is mainly to remove callus on the little slough and biofilm down to good subcutaneous tissue. In general I think that she is making excellent headway  here. Electronic Signature(s) Signed: 08/14/2023 1:31:11 PM By: [name] PA-C Entered By: [name] on 08/14/2023 10:31:11 -------------------------------------------------------------------------------- Physical Exam Details Patient Name: Date of Service: KISHARA, LOVELAND 08/14/2023 12:45 PM Medical Record Number: [id number] Patient Account Number: [id number] Date of Birth/Sex: Treating RN: [birth year] (47 y.o. Ginette Pitman Primary Care Provider: [name] Other Clinician: [name] Referring Provider: Treating Provider/Extender: [name] Weeks in Treatment: 81 Constitutional Well-nourished and  Ag to the right foot wound. Antibiotic ointment to the right anterior leg wound. She completed her oral antibiotics. She has no issues or complaints today. Wounds continue to improve albeit slowly. 8/14; 8/14; patient has 3 wounds 1 on the left plantar heel 1 on the right plantar heel and a small wound on the right anterior lower leg. We have been using Hydrofera Blue in the left heel Aquacel on the right heel. The left heel has been in a total contact cast 8/21; patient presents for follow-up. We have been using Hydrofera Blue to the left foot wound under the total contact cast. T the right heel patient has been o using Aquacel Ag. T the anterior leg wound antibiotic ointment. All wounds have healed. o 8/28; patient presents for follow-up. She has been using her surgical shoe with offloading felt pads. Her wounds have remained closed. 9/11; patient presents for final follow-up to assure that her wounds have remained closed. Unfortunately the left foot wound has opened again. But she has been wearing her Surgical shoe for offloading with felt pad. 07-14-2023 upon  evaluation today patient appears to be doing well currently in regard to her wound. She has been tolerating the dressing changes without complication with total contact casting. I am going to be helping take care of her at this point as she is transferring from Dr. Mikey Bussing to me since Dr. Mikey Bussing is on maternity leave. 07-24-2023 upon evaluation today patient appears to be doing decently well currently in regard to her wound. She is actually showing signs of improvement in regard to the left foot and I feel like this is much better than last week no debridement even necessary today. We may have to perform some slight debridement come next week we will see how things go. Monitor for any signs of worsening overall and obviously if anything changes she should be letting us know. 08-01-2023 upon evaluation today patient appears to be doing excellent in regard to her feet. The right foot/heel area is completely healed she does have some callus buildup I am going to trim this down a little bit I do not want to build up to the point of cracking she is doing well with the urea cream. With regard to the left heel she is doing well with casting I am going to perform some debridement here to clear away before reapplying the cast and I think she is really doing quite well though. 08-07-2023 upon evaluation patient's wound actually showed signs of excellent improvement and actually very pleased with where we stand I do believe that she is making good headway towards complete closure which is great news. 08-14-2023 upon evaluation today patient appears to be doing well currently in regard to her wound. She actually is going require a little bit of debridement but fortunately nothing too significant today actually feel like she is doing much better. This is mainly to remove callus on the little slough and biofilm down to good subcutaneous tissue. In general I think that she is making excellent headway  here. Electronic Signature(s) Signed: 08/14/2023 1:31:11 PM By: [name] PA-C Entered By: [name] on 08/14/2023 10:31:11 -------------------------------------------------------------------------------- Physical Exam Details Patient Name: Date of Service: KISHARA, LOVELAND 08/14/2023 12:45 PM Medical Record Number: [id number] Patient Account Number: [id number] Date of Birth/Sex: Treating RN: [birth year] (47 y.o. Ginette Pitman Primary Care Provider: [name] Other Clinician: [name] Referring Provider: Treating Provider/Extender: [name] Weeks in Treatment: 81 Constitutional Well-nourished and  Minette A ([id number]) [id number] Page 20 of 21 Post procedure Diagnosis Wound #12R: Same as Pre-Procedure Pre-procedure diagnosis of Wound #12R is a Diabetic Wound/Ulcer of the Lower Extremity located on the Left,Medial,Plantar Foot . There was a T Contact otal Cast Procedure by [name], PA-C. Post procedure Diagnosis Wound #12R: Same as Pre-Procedure Plan Follow-up Appointments: Return Appointment in 1 week. Bathing/ Shower/ Hygiene: May shower; gently cleanse wound with antibacterial soap, rinse and pat dry prior to dressing wounds No tub bath. Off-Loading: T Contact  Cast to Left Lower Extremity otal Open toe surgical shoe - right foot WOUND #12R: - Foot Wound Laterality: Plantar, Left, Medial Cleanser: Vashe 5.8 (oz) Discharge Instructions: Use vashe 5.8 (oz) as directed Topical: Gentamicin Discharge Instructions: Apply as directed by provider. Prim Dressing: Hydrofera Blue Ready Transfer Foam, 2.5x2.5 (in/in) ary Discharge Instructions: Apply Hydrofera Blue Ready to wound bed as directed Secondary Dressing: ABD Pad 5x9 (in/in) Discharge Instructions: Cover with ABD pad 1. I am going to recommend that the patient should continue with the total contact casting along with the [facility] Blue and mupirocin. 2. I did go ahead and reapply the cast myself today as well and patient tolerated that without complication. We will see patient back for reevaluation in 1 week here in the clinic. If anything worsens or changes patient will contact our office for additional recommendations. Electronic Signature(s) Signed: 08/14/2023 1:31:42 PM By: [name] PA-C Entered By: [name] on 08/14/2023 10:31:42 -------------------------------------------------------------------------------- Total Contact Cast Details Patient Name: Date of Service: ANALIESE, DEMARTINI 08/14/2023 12:45 PM Medical Record Number: [id number] Patient Account Number: [id number] Date of Birth/Sex: Treating RN: 01/22/1976 (48 y.o. Ginette Pitman Primary Care Provider: [name] Other Clinician: [name] Referring Provider: Treating Provider/Extender: [name] Weeks in Treatment: 10 T Contact Cast Applied for Wound Assessment: otal Wound #12R Left,Medial,Plantar Foot Performed By: Physician [name], PA-C Post Procedure Diagnosis Same as Pre-procedure Electronic Signature(s) Signed: 08/14/2023 4:50:43 PM By: [name] PA-C Signed: 08/15/2023 4:53:28 PM By: [name] Entered By: [name] on 08/14/2023 10:24:25 Earmon Phoenix A ([id number])  [id number] Page 21 of 21 -------------------------------------------------------------------------------- SuperBill Details Patient Name: Date of Service: SINIAH, DAMICO 08/14/2023 Medical Record Number: [id number] Patient Account Number: [id number] Date of Birth/Sex: Treating RN: 03-Feb-1976 (47 y.o. 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 Z79.4 Long term (current) use of insulin N18.6 End stage renal disease Z99.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 L84 Corns and callosities Facility Procedures : CPT4 Code: 35009381 Description: 11042 - DEB SUBQ TISSUE 20 SQ CM/< ICD-10 Diagnosis Description L97.528 Non-pressure chronic ulcer of other part of left foot with other specified seve Modifier: rity Quantity: 1 Physician Procedures : CPT4 Code Description Modifier [id number] 11042 - WC PHYS SUBQ TISS 20 SQ CM ICD-10 Diagnosis Description L97.528 Non-pressure chronic ulcer of other part of left foot with other specified severity Quantity: 1 Electronic Signature(s) Signed: 08/14/2023 1:32:06 PM By: [name] PA-C Entered By: [name] on 08/14/2023 10:32:06  well-hydrated in no acute distress. Respiratory normal breathing without difficulty. Psychiatric this patient is able to make decisions and demonstrates good insight into disease process. Alert and Oriented x 3. pleasant and cooperative. Notes Upon inspection patient's wound bed actually showed signs of good granulation epithelization at this point. Fortunately I do not see any signs of worsening overall and I believe that the patient is making excellent headway towards closure I am very pleased with what we are seeing today. Electronic Signature(s) Signed: 08/14/2023 1:31:26 PM By: Cathren Harsh, Ayumi A ([id number]) [id number] Page 10 of 21 Signed: 08/14/2023 1:31:26 PM By: [name] PA-C Entered By: [name] on 08/14/2023 10:31:25 -------------------------------------------------------------------------------- Physician Orders Details Patient Name: Date of Service: ZUZANA, SCHECKEL 08/14/2023 12:45 PM Medical Record Number: [id number] Patient Account Number: [id number] Date of Birth/Sex: Treating RN: 08/26/1976 (47 y.o. 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 Bathing/ Shower/ Hygiene May shower; gently cleanse wound with antibacterial soap, rinse and pat dry prior to dressing wounds No tub bath. Off-Loading Total Contact Cast to Left Lower Extremity Open toe surgical shoe - right foot Wound Treatment Wound #12R - Foot Wound Laterality: Plantar, Left, Medial Cleanser: Vashe 5.8 (oz) Discharge Instructions: Use vashe 5.8 (oz) as directed Topical: Gentamicin Discharge Instructions: Apply as directed by provider. Prim Dressing: Hydrofera Blue Ready Transfer Foam, 2.5x2.5 (in/in) ary Discharge Instructions: Apply Hydrofera Blue Ready to wound bed as directed Secondary Dressing: ABD Pad 5x9 (in/in) Discharge Instructions: Cover with ABD pad Electronic Signature(s) Signed: 08/17/2023 6:30:03 PM By: [name] PA-C Signed: 08/18/2023 10:28:30 AM By: [name] Previous Signature: 08/14/2023 4:50:43 PM Version By: [name] PA-C Previous Signature: 08/15/2023 4:53:28 PM Version By: [name] Entered By: [name] on 08/17/2023 14:07:24 Earmon Phoenix A ([id number]) [id number] Page 11 of 21 -------------------------------------------------------------------------------- Problem List Details Patient Name: Date of Service: JODDIE, DIGIORGIO 08/14/2023 12:45 PM Medical Record Number:  [id number] Patient Account Number: [id number] Date of Birth/Sex: Treating RN: 1975/12/10 (47 y.o. 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 03/16/2022 No Yes severity E11.42 Type 2 diabetes mellitus with diabetic polyneuropathy 03/16/2022 No Yes Z79.4 Long term (current) use of insulin 02/22/2023 No Yes N18.6 End stage renal disease 07/14/2023 No Yes Z99.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 07/14/2023 No Yes L84 Corns and callosities 08/01/2023 No Yes Inactive Problems Resolved Problems ICD-10 Code Description Active Date Resolved Date L97.512 Non-pressure chronic ulcer of other part of right foot with fat layer exposed 03/16/2022 03/16/2022 L97.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 of skin  well-hydrated in no acute distress. Respiratory normal breathing without difficulty. Psychiatric this patient is able to make decisions and demonstrates good insight into disease process. Alert and Oriented x 3. pleasant and cooperative. Notes Upon inspection patient's wound bed actually showed signs of good granulation epithelization at this point. Fortunately I do not see any signs of worsening overall and I believe that the patient is making excellent headway towards closure I am very pleased with what we are seeing today. Electronic Signature(s) Signed: 08/14/2023 1:31:26 PM By: Cathren Harsh, Ayumi A ([id number]) [id number] Page 10 of 21 Signed: 08/14/2023 1:31:26 PM By: [name] PA-C Entered By: [name] on 08/14/2023 10:31:25 -------------------------------------------------------------------------------- Physician Orders Details Patient Name: Date of Service: ZUZANA, SCHECKEL 08/14/2023 12:45 PM Medical Record Number: [id number] Patient Account Number: [id number] Date of Birth/Sex: Treating RN: 08/26/1976 (47 y.o. 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 Bathing/ Shower/ Hygiene May shower; gently cleanse wound with antibacterial soap, rinse and pat dry prior to dressing wounds No tub bath. Off-Loading Total Contact Cast to Left Lower Extremity Open toe surgical shoe - right foot Wound Treatment Wound #12R - Foot Wound Laterality: Plantar, Left, Medial Cleanser: Vashe 5.8 (oz) Discharge Instructions: Use vashe 5.8 (oz) as directed Topical: Gentamicin Discharge Instructions: Apply as directed by provider. Prim Dressing: Hydrofera Blue Ready Transfer Foam, 2.5x2.5 (in/in) ary Discharge Instructions: Apply Hydrofera Blue Ready to wound bed as directed Secondary Dressing: ABD Pad 5x9 (in/in) Discharge Instructions: Cover with ABD pad Electronic Signature(s) Signed: 08/17/2023 6:30:03 PM By: [name] PA-C Signed: 08/18/2023 10:28:30 AM By: [name] Previous Signature: 08/14/2023 4:50:43 PM Version By: [name] PA-C Previous Signature: 08/15/2023 4:53:28 PM Version By: [name] Entered By: [name] on 08/17/2023 14:07:24 Earmon Phoenix A ([id number]) [id number] Page 11 of 21 -------------------------------------------------------------------------------- Problem List Details Patient Name: Date of Service: JODDIE, DIGIORGIO 08/14/2023 12:45 PM Medical Record Number:  [id number] Patient Account Number: [id number] Date of Birth/Sex: Treating RN: 1975/12/10 (47 y.o. 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 03/16/2022 No Yes severity E11.42 Type 2 diabetes mellitus with diabetic polyneuropathy 03/16/2022 No Yes Z79.4 Long term (current) use of insulin 02/22/2023 No Yes N18.6 End stage renal disease 07/14/2023 No Yes Z99.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 07/14/2023 No Yes L84 Corns and callosities 08/01/2023 No Yes Inactive Problems Resolved Problems ICD-10 Code Description Active Date Resolved Date L97.512 Non-pressure chronic ulcer of other part of right foot with fat layer exposed 03/16/2022 03/16/2022 L97.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 of skin  well-hydrated in no acute distress. Respiratory normal breathing without difficulty. Psychiatric this patient is able to make decisions and demonstrates good insight into disease process. Alert and Oriented x 3. pleasant and cooperative. Notes Upon inspection patient's wound bed actually showed signs of good granulation epithelization at this point. Fortunately I do not see any signs of worsening overall and I believe that the patient is making excellent headway towards closure I am very pleased with what we are seeing today. Electronic Signature(s) Signed: 08/14/2023 1:31:26 PM By: Cathren Harsh, Ayumi A ([id number]) [id number] Page 10 of 21 Signed: 08/14/2023 1:31:26 PM By: [name] PA-C Entered By: [name] on 08/14/2023 10:31:25 -------------------------------------------------------------------------------- Physician Orders Details Patient Name: Date of Service: ZUZANA, SCHECKEL 08/14/2023 12:45 PM Medical Record Number: [id number] Patient Account Number: [id number] Date of Birth/Sex: Treating RN: 08/26/1976 (47 y.o. 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 Bathing/ Shower/ Hygiene May shower; gently cleanse wound with antibacterial soap, rinse and pat dry prior to dressing wounds No tub bath. Off-Loading Total Contact Cast to Left Lower Extremity Open toe surgical shoe - right foot Wound Treatment Wound #12R - Foot Wound Laterality: Plantar, Left, Medial Cleanser: Vashe 5.8 (oz) Discharge Instructions: Use vashe 5.8 (oz) as directed Topical: Gentamicin Discharge Instructions: Apply as directed by provider. Prim Dressing: Hydrofera Blue Ready Transfer Foam, 2.5x2.5 (in/in) ary Discharge Instructions: Apply Hydrofera Blue Ready to wound bed as directed Secondary Dressing: ABD Pad 5x9 (in/in) Discharge Instructions: Cover with ABD pad Electronic Signature(s) Signed: 08/17/2023 6:30:03 PM By: [name] PA-C Signed: 08/18/2023 10:28:30 AM By: [name] Previous Signature: 08/14/2023 4:50:43 PM Version By: [name] PA-C Previous Signature: 08/15/2023 4:53:28 PM Version By: [name] Entered By: [name] on 08/17/2023 14:07:24 Earmon Phoenix A ([id number]) [id number] Page 11 of 21 -------------------------------------------------------------------------------- Problem List Details Patient Name: Date of Service: JODDIE, DIGIORGIO 08/14/2023 12:45 PM Medical Record Number:  [id number] Patient Account Number: [id number] Date of Birth/Sex: Treating RN: 1975/12/10 (47 y.o. 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 03/16/2022 No Yes severity E11.42 Type 2 diabetes mellitus with diabetic polyneuropathy 03/16/2022 No Yes Z79.4 Long term (current) use of insulin 02/22/2023 No Yes N18.6 End stage renal disease 07/14/2023 No Yes Z99.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 07/14/2023 No Yes L84 Corns and callosities 08/01/2023 No Yes Inactive Problems Resolved Problems ICD-10 Code Description Active Date Resolved Date L97.512 Non-pressure chronic ulcer of other part of right foot with fat layer exposed 03/16/2022 03/16/2022 L97.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 of skin  KATHYJO, SOLES A ([id number]) [id number] Page 1 of 21 Visit Report for 08/14/2023 Chief Complaint Document Details Patient Name: Date of Service: MALEIYAH, VISCARRA 08/14/2023 12:45 PM Medical Record Number: [id number] Patient Account Number: [id number] Date of Birth/Sex: Treating RN: [birth year] (47 y.o. Ginette Pitman Primary Care Provider: [name] Other Clinician: [name] Referring Provider: Treating Provider/Extender: [name] Weeks in Treatment: 13 Information Obtained from: Patient Chief Complaint Left foot ulcer Electronic Signature(s) Signed: 08/14/2023 1:17:49 PM By: [name] PA-C Entered By: [name] on 08/14/2023 10:17:49 -------------------------------------------------------------------------------- Debridement Details Patient Name: Date of Service: [name] 08/14/2023 12:45 PM Medical Record Number: [id number] Patient Account Number: [id number] Date of Birth/Sex: Treating RN: 07/18/1976 (47 y.o. Ginette Pitman Primary Care Provider: [name] Other Clinician: [name] Referring Provider: Treating Provider/Extender: [name] Weeks in Treatment: 73 Debridement Performed for Assessment: Wound #12R Left,Medial,Plantar Foot Performed By: Physician [name], PA-C The following information was scribed by: [name] The information was scribed for: [name] Debridement Type: Debridement Severity of Tissue Pre Debridement: Fat layer exposed Level of Consciousness (Pre-procedure): Awake and Alert Pre-procedure Verification/Time Out Yes - 13:20 Taken: Start Time: 13:20 Percent of Wound Bed Debrided: 100% T Area Debrided (cm): otal 0.07 Tissue and other material debrided: Viable, Non-Viable, Callus, Slough, Subcutaneous, Slough Level: Skin/Subcutaneous Tissue Debridement Description: Excisional Instrument: Curette Bleeding: Minimum Hemostasis Achieved:  Pressure Nuzum, Clemie A ([id number]) [id number].pdf Page 2 of 21 Response to Treatment: Procedure was tolerated well Level of Consciousness (Post- Awake and Alert procedure): Post Debridement Measurements of Total Wound Length: (cm) 0.3 Width: (cm) 0.3 Depth: (cm) 0.1 Volume: (cm) 0.007 Character of Wound/Ulcer Post Debridement: Stable Severity of Tissue Post Debridement: Fat layer exposed Post Procedure Diagnosis Same as Pre-procedure Electronic Signature(s) Signed: 08/17/2023 6:30:03 PM By: [name] PA-C Signed: 08/18/2023 10:28:30 AM By: [name] Signed: 08/18/2023 1:46:08 PM By: Midge Aver MSN RN CNS WTA Previous Signature: 08/14/2023 4:50:43 PM Version By: [name] PA-C Previous Signature: 08/14/2023 5:06:23 PM Version By: Midge Aver MSN RN CNS WTA Previous Signature: 08/15/2023 4:53:28 PM Version By: [name] Entered By: [name] on 08/17/2023 14:06:45 -------------------------------------------------------------------------------- HPI Details Patient Name: Date of Service: GLORA, JEANPHILIPPE A. 08/14/2023 12:45 PM Medical Record Number: [id number] Patient Account Number: [id number] Date of Birth/Sex: Treating RN: July 28, 1976 (47 y.o. Ginette Pitman Primary Care Provider: [name] Other Clinician: [name] Referring Provider: Treating Provider/Extender: [name] in Treatment: 76 History of Present Illness HPI Description: 01/18/18-She is here for initial evaluation of the left great toe ulcer. She is a poor historian in regards to timeframe in detail. She states approximately 4 weeks ago she lacerated her toe on something in the house. She followed up with her primary care who placed her on Bactrim and ultimately a second dose of Bactrim prior to coming to wound clinic. She states she has been treating the toe with peroxide, Betadine and a Band-Aid. She did not check her blood sugar this  morning but checked it yesterday morning it was 327; she is unaware of a recent A1c and there are no current records. She saw Dr. she would've orthopedics last week for an old injury to the left ankle, she states he did not see her toe, nor did she bring it to his attention. She smokes approximately 1 pack cigarettes a day. Her social situation is concerning, she arrives this morning with her mother who appears extremely intoxicated/under the influence;  Ag to the right foot wound. Antibiotic ointment to the right anterior leg wound. She completed her oral antibiotics. She has no issues or complaints today. Wounds continue to improve albeit slowly. 8/14; 8/14; patient has 3 wounds 1 on the left plantar heel 1 on the right plantar heel and a small wound on the right anterior lower leg. We have been using Hydrofera Blue in the left heel Aquacel on the right heel. The left heel has been in a total contact cast 8/21; patient presents for follow-up. We have been using Hydrofera Blue to the left foot wound under the total contact cast. T the right heel patient has been o using Aquacel Ag. T the anterior leg wound antibiotic ointment. All wounds have healed. o 8/28; patient presents for follow-up. She has been using her surgical shoe with offloading felt pads. Her wounds have remained closed. 9/11; patient presents for final follow-up to assure that her wounds have remained closed. Unfortunately the left foot wound has opened again. But she has been wearing her Surgical shoe for offloading with felt pad. 07-14-2023 upon  evaluation today patient appears to be doing well currently in regard to her wound. She has been tolerating the dressing changes without complication with total contact casting. I am going to be helping take care of her at this point as she is transferring from Dr. Mikey Bussing to me since Dr. Mikey Bussing is on maternity leave. 07-24-2023 upon evaluation today patient appears to be doing decently well currently in regard to her wound. She is actually showing signs of improvement in regard to the left foot and I feel like this is much better than last week no debridement even necessary today. We may have to perform some slight debridement come next week we will see how things go. Monitor for any signs of worsening overall and obviously if anything changes she should be letting us know. 08-01-2023 upon evaluation today patient appears to be doing excellent in regard to her feet. The right foot/heel area is completely healed she does have some callus buildup I am going to trim this down a little bit I do not want to build up to the point of cracking she is doing well with the urea cream. With regard to the left heel she is doing well with casting I am going to perform some debridement here to clear away before reapplying the cast and I think she is really doing quite well though. 08-07-2023 upon evaluation patient's wound actually showed signs of excellent improvement and actually very pleased with where we stand I do believe that she is making good headway towards complete closure which is great news. 08-14-2023 upon evaluation today patient appears to be doing well currently in regard to her wound. She actually is going require a little bit of debridement but fortunately nothing too significant today actually feel like she is doing much better. This is mainly to remove callus on the little slough and biofilm down to good subcutaneous tissue. In general I think that she is making excellent headway  here. Electronic Signature(s) Signed: 08/14/2023 1:31:11 PM By: [name] PA-C Entered By: [name] on 08/14/2023 10:31:11 -------------------------------------------------------------------------------- Physical Exam Details Patient Name: Date of Service: KISHARA, LOVELAND 08/14/2023 12:45 PM Medical Record Number: [id number] Patient Account Number: [id number] Date of Birth/Sex: Treating RN: [birth year] (47 y.o. Ginette Pitman Primary Care Provider: [name] Other Clinician: [name] Referring Provider: Treating Provider/Extender: [name] Weeks in Treatment: 81 Constitutional Well-nourished and  KATHYJO, SOLES A ([id number]) [id number] Page 1 of 21 Visit Report for 08/14/2023 Chief Complaint Document Details Patient Name: Date of Service: MALEIYAH, VISCARRA 08/14/2023 12:45 PM Medical Record Number: [id number] Patient Account Number: [id number] Date of Birth/Sex: Treating RN: [birth year] (47 y.o. Ginette Pitman Primary Care Provider: [name] Other Clinician: [name] Referring Provider: Treating Provider/Extender: [name] Weeks in Treatment: 13 Information Obtained from: Patient Chief Complaint Left foot ulcer Electronic Signature(s) Signed: 08/14/2023 1:17:49 PM By: [name] PA-C Entered By: [name] on 08/14/2023 10:17:49 -------------------------------------------------------------------------------- Debridement Details Patient Name: Date of Service: [name] 08/14/2023 12:45 PM Medical Record Number: [id number] Patient Account Number: [id number] Date of Birth/Sex: Treating RN: 07/18/1976 (47 y.o. Ginette Pitman Primary Care Provider: [name] Other Clinician: [name] Referring Provider: Treating Provider/Extender: [name] Weeks in Treatment: 73 Debridement Performed for Assessment: Wound #12R Left,Medial,Plantar Foot Performed By: Physician [name], PA-C The following information was scribed by: [name] The information was scribed for: [name] Debridement Type: Debridement Severity of Tissue Pre Debridement: Fat layer exposed Level of Consciousness (Pre-procedure): Awake and Alert Pre-procedure Verification/Time Out Yes - 13:20 Taken: Start Time: 13:20 Percent of Wound Bed Debrided: 100% T Area Debrided (cm): otal 0.07 Tissue and other material debrided: Viable, Non-Viable, Callus, Slough, Subcutaneous, Slough Level: Skin/Subcutaneous Tissue Debridement Description: Excisional Instrument: Curette Bleeding: Minimum Hemostasis Achieved:  Pressure Nuzum, Clemie A ([id number]) [id number].pdf Page 2 of 21 Response to Treatment: Procedure was tolerated well Level of Consciousness (Post- Awake and Alert procedure): Post Debridement Measurements of Total Wound Length: (cm) 0.3 Width: (cm) 0.3 Depth: (cm) 0.1 Volume: (cm) 0.007 Character of Wound/Ulcer Post Debridement: Stable Severity of Tissue Post Debridement: Fat layer exposed Post Procedure Diagnosis Same as Pre-procedure Electronic Signature(s) Signed: 08/17/2023 6:30:03 PM By: [name] PA-C Signed: 08/18/2023 10:28:30 AM By: [name] Signed: 08/18/2023 1:46:08 PM By: Midge Aver MSN RN CNS WTA Previous Signature: 08/14/2023 4:50:43 PM Version By: [name] PA-C Previous Signature: 08/14/2023 5:06:23 PM Version By: Midge Aver MSN RN CNS WTA Previous Signature: 08/15/2023 4:53:28 PM Version By: [name] Entered By: [name] on 08/17/2023 14:06:45 -------------------------------------------------------------------------------- HPI Details Patient Name: Date of Service: GLORA, JEANPHILIPPE A. 08/14/2023 12:45 PM Medical Record Number: [id number] Patient Account Number: [id number] Date of Birth/Sex: Treating RN: July 28, 1976 (47 y.o. Ginette Pitman Primary Care Provider: [name] Other Clinician: [name] Referring Provider: Treating Provider/Extender: [name] in Treatment: 76 History of Present Illness HPI Description: 01/18/18-She is here for initial evaluation of the left great toe ulcer. She is a poor historian in regards to timeframe in detail. She states approximately 4 weeks ago she lacerated her toe on something in the house. She followed up with her primary care who placed her on Bactrim and ultimately a second dose of Bactrim prior to coming to wound clinic. She states she has been treating the toe with peroxide, Betadine and a Band-Aid. She did not check her blood sugar this  morning but checked it yesterday morning it was 327; she is unaware of a recent A1c and there are no current records. She saw Dr. she would've orthopedics last week for an old injury to the left ankle, she states he did not see her toe, nor did she bring it to his attention. She smokes approximately 1 pack cigarettes a day. Her social situation is concerning, she arrives this morning with her mother who appears extremely intoxicated/under the influence;  Ag to the right foot wound. Antibiotic ointment to the right anterior leg wound. She completed her oral antibiotics. She has no issues or complaints today. Wounds continue to improve albeit slowly. 8/14; 8/14; patient has 3 wounds 1 on the left plantar heel 1 on the right plantar heel and a small wound on the right anterior lower leg. We have been using Hydrofera Blue in the left heel Aquacel on the right heel. The left heel has been in a total contact cast 8/21; patient presents for follow-up. We have been using Hydrofera Blue to the left foot wound under the total contact cast. T the right heel patient has been o using Aquacel Ag. T the anterior leg wound antibiotic ointment. All wounds have healed. o 8/28; patient presents for follow-up. She has been using her surgical shoe with offloading felt pads. Her wounds have remained closed. 9/11; patient presents for final follow-up to assure that her wounds have remained closed. Unfortunately the left foot wound has opened again. But she has been wearing her Surgical shoe for offloading with felt pad. 07-14-2023 upon  evaluation today patient appears to be doing well currently in regard to her wound. She has been tolerating the dressing changes without complication with total contact casting. I am going to be helping take care of her at this point as she is transferring from Dr. Mikey Bussing to me since Dr. Mikey Bussing is on maternity leave. 07-24-2023 upon evaluation today patient appears to be doing decently well currently in regard to her wound. She is actually showing signs of improvement in regard to the left foot and I feel like this is much better than last week no debridement even necessary today. We may have to perform some slight debridement come next week we will see how things go. Monitor for any signs of worsening overall and obviously if anything changes she should be letting us know. 08-01-2023 upon evaluation today patient appears to be doing excellent in regard to her feet. The right foot/heel area is completely healed she does have some callus buildup I am going to trim this down a little bit I do not want to build up to the point of cracking she is doing well with the urea cream. With regard to the left heel she is doing well with casting I am going to perform some debridement here to clear away before reapplying the cast and I think she is really doing quite well though. 08-07-2023 upon evaluation patient's wound actually showed signs of excellent improvement and actually very pleased with where we stand I do believe that she is making good headway towards complete closure which is great news. 08-14-2023 upon evaluation today patient appears to be doing well currently in regard to her wound. She actually is going require a little bit of debridement but fortunately nothing too significant today actually feel like she is doing much better. This is mainly to remove callus on the little slough and biofilm down to good subcutaneous tissue. In general I think that she is making excellent headway  here. Electronic Signature(s) Signed: 08/14/2023 1:31:11 PM By: [name] PA-C Entered By: [name] on 08/14/2023 10:31:11 -------------------------------------------------------------------------------- Physical Exam Details Patient Name: Date of Service: KISHARA, LOVELAND 08/14/2023 12:45 PM Medical Record Number: [id number] Patient Account Number: [id number] Date of Birth/Sex: Treating RN: [birth year] (47 y.o. Ginette Pitman Primary Care Provider: [name] Other Clinician: [name] Referring Provider: Treating Provider/Extender: [name] Weeks in Treatment: 81 Constitutional Well-nourished and  well-hydrated in no acute distress. Respiratory normal breathing without difficulty. Psychiatric this patient is able to make decisions and demonstrates good insight into disease process. Alert and Oriented x 3. pleasant and cooperative. Notes Upon inspection patient's wound bed actually showed signs of good granulation epithelization at this point. Fortunately I do not see any signs of worsening overall and I believe that the patient is making excellent headway towards closure I am very pleased with what we are seeing today. Electronic Signature(s) Signed: 08/14/2023 1:31:26 PM By: Cathren Harsh, Ayumi A ([id number]) [id number] Page 10 of 21 Signed: 08/14/2023 1:31:26 PM By: [name] PA-C Entered By: [name] on 08/14/2023 10:31:25 -------------------------------------------------------------------------------- Physician Orders Details Patient Name: Date of Service: ZUZANA, SCHECKEL 08/14/2023 12:45 PM Medical Record Number: [id number] Patient Account Number: [id number] Date of Birth/Sex: Treating RN: 08/26/1976 (47 y.o. 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 Bathing/ Shower/ Hygiene May shower; gently cleanse wound with antibacterial soap, rinse and pat dry prior to dressing wounds No tub bath. Off-Loading Total Contact Cast to Left Lower Extremity Open toe surgical shoe - right foot Wound Treatment Wound #12R - Foot Wound Laterality: Plantar, Left, Medial Cleanser: Vashe 5.8 (oz) Discharge Instructions: Use vashe 5.8 (oz) as directed Topical: Gentamicin Discharge Instructions: Apply as directed by provider. Prim Dressing: Hydrofera Blue Ready Transfer Foam, 2.5x2.5 (in/in) ary Discharge Instructions: Apply Hydrofera Blue Ready to wound bed as directed Secondary Dressing: ABD Pad 5x9 (in/in) Discharge Instructions: Cover with ABD pad Electronic Signature(s) Signed: 08/17/2023 6:30:03 PM By: [name] PA-C Signed: 08/18/2023 10:28:30 AM By: [name] Previous Signature: 08/14/2023 4:50:43 PM Version By: [name] PA-C Previous Signature: 08/15/2023 4:53:28 PM Version By: [name] Entered By: [name] on 08/17/2023 14:07:24 Earmon Phoenix A ([id number]) [id number] Page 11 of 21 -------------------------------------------------------------------------------- Problem List Details Patient Name: Date of Service: JODDIE, DIGIORGIO 08/14/2023 12:45 PM Medical Record Number:  [id number] Patient Account Number: [id number] Date of Birth/Sex: Treating RN: 1975/12/10 (47 y.o. 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 03/16/2022 No Yes severity E11.42 Type 2 diabetes mellitus with diabetic polyneuropathy 03/16/2022 No Yes Z79.4 Long term (current) use of insulin 02/22/2023 No Yes N18.6 End stage renal disease 07/14/2023 No Yes Z99.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 07/14/2023 No Yes L84 Corns and callosities 08/01/2023 No Yes Inactive Problems Resolved Problems ICD-10 Code Description Active Date Resolved Date L97.512 Non-pressure chronic ulcer of other part of right foot with fat layer exposed 03/16/2022 03/16/2022 L97.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 of skin  03/16/2022 03/16/2022 W54.0XXA Bitten by dog, initial encounter 05/17/2023 05/17/2023 S81.801A Unspecified open wound, right lower leg, initial encounter 05/17/2023 05/17/2023 SHANIN, PARRILLI A ([id number]) [phone].pdf Page 12 of 21 E11.622 Type 2 diabetes mellitus with other skin ulcer 05/17/2023 05/17/2023 Electronic Signature(s) Signed: 08/14/2023 1:17:41 PM By: [name] PA-C Entered By: [name] on 08/14/2023 10:17:41 -------------------------------------------------------------------------------- Progress Note Details Patient Name: Date of Service: JOHNAE, IANNONE 08/14/2023 12:45 PM Medical Record Number: [id number] Patient Account Number: [id number] Date of Birth/Sex: Treating RN: February 04, 1976 (47 y.o. Ginette Pitman Primary Care Provider: [name] Other  Clinician: [name] Referring Provider: Treating Provider/Extender: [name] in Treatment: 38 Subjective Chief Complaint Information obtained from Patient Left foot ulcer History of Present Illness (HPI) 01/18/18-She is here for initial evaluation of the left great toe ulcer. She is a poor historian in regards to timeframe in detail. She states approximately 4 weeks ago she lacerated her toe on something in the house. She followed up with her primary care who placed her on Bactrim and ultimately a second dose of Bactrim prior to coming to wound clinic. She states she has been treating the toe with peroxide, Betadine and a Band-Aid. She did not check her blood sugar this morning but checked it yesterday morning it was 327; she is unaware of a recent A1c and there are no current records. She saw Dr. she would've orthopedics last week for an old injury to the left ankle, she states he did not see her toe, nor did she bring it to his attention. She smokes approximately 1 pack cigarettes a day. Her social situation is concerning, she arrives this morning with her mother who appears extremely intoxicated/under the influence; her mother was asked to leave the room and be monitored by the patient's grandmother. The patient's aunt then accompanied the patient and the room throughout the rest of the appointment. We had a lengthy discussion regarding the deleterious effects of uncontrolled hyperglycemia and smoking as it relates to wound healing and overall health. She was strongly encouraged to decrease her smoking and get her diabetes under better control. She states she is currently on a diet and has cut down her [facility] consumption. The left toe is erythematous, macerated and slightly edematous with malodor present. The edema in her left foot is below her baseline, there is no erythema streaking. We will treat her with Santyl, doxycycline; we have ordered and xray,  culture and provided a Peg assist surgical shoe and cultured the wound. 01/25/18-She is here in follow-up evaluation for a left great toe ulcer and presents with an abscess to her suprapubic area. She states her blood sugars remain elevated, feeling "sick" and if levels are below 250, but she is trying. She has made no attempt to decrease her smoking stating that we "can't take away her food in her cigarettes". She has been compliant with offloading using the PEG assist you. She is using Santyl daily. the culture obtained last week grew staph aureus and Enterococcus faecalis; continues on the doxycycline and Augmentin was added on Monday. The suprapubic area has erythema, no femoral variation, purple discoloration, minimal induration, was accessed with a cotton tip applicator with sanguinopurulent drainage, this was cultured, I suspect the current antibiotic treatment will cover and we will not add anything to her current treatment plan. She was advised to go to urgent care or ER with any change in redness, induration or fever. 02/01/18-She is here in follow-up evaluation for left great  Ag to the right foot wound. Antibiotic ointment to the right anterior leg wound. She completed her oral antibiotics. She has no issues or complaints today. Wounds continue to improve albeit slowly. 8/14; 8/14; patient has 3 wounds 1 on the left plantar heel 1 on the right plantar heel and a small wound on the right anterior lower leg. We have been using Hydrofera Blue in the left heel Aquacel on the right heel. The left heel has been in a total contact cast 8/21; patient presents for follow-up. We have been using Hydrofera Blue to the left foot wound under the total contact cast. T the right heel patient has been o using Aquacel Ag. T the anterior leg wound antibiotic ointment. All wounds have healed. o 8/28; patient presents for follow-up. She has been using her surgical shoe with offloading felt pads. Her wounds have remained closed. 9/11; patient presents for final follow-up to assure that her wounds have remained closed. Unfortunately the left foot wound has opened again. But she has been wearing her Surgical shoe for offloading with felt pad. 07-14-2023 upon  evaluation today patient appears to be doing well currently in regard to her wound. She has been tolerating the dressing changes without complication with total contact casting. I am going to be helping take care of her at this point as she is transferring from Dr. Mikey Bussing to me since Dr. Mikey Bussing is on maternity leave. 07-24-2023 upon evaluation today patient appears to be doing decently well currently in regard to her wound. She is actually showing signs of improvement in regard to the left foot and I feel like this is much better than last week no debridement even necessary today. We may have to perform some slight debridement come next week we will see how things go. Monitor for any signs of worsening overall and obviously if anything changes she should be letting us know. 08-01-2023 upon evaluation today patient appears to be doing excellent in regard to her feet. The right foot/heel area is completely healed she does have some callus buildup I am going to trim this down a little bit I do not want to build up to the point of cracking she is doing well with the urea cream. With regard to the left heel she is doing well with casting I am going to perform some debridement here to clear away before reapplying the cast and I think she is really doing quite well though. 08-07-2023 upon evaluation patient's wound actually showed signs of excellent improvement and actually very pleased with where we stand I do believe that she is making good headway towards complete closure which is great news. 08-14-2023 upon evaluation today patient appears to be doing well currently in regard to her wound. She actually is going require a little bit of debridement but fortunately nothing too significant today actually feel like she is doing much better. This is mainly to remove callus on the little slough and biofilm down to good subcutaneous tissue. In general I think that she is making excellent headway  here. Electronic Signature(s) Signed: 08/14/2023 1:31:11 PM By: [name] PA-C Entered By: [name] on 08/14/2023 10:31:11 -------------------------------------------------------------------------------- Physical Exam Details Patient Name: Date of Service: KISHARA, LOVELAND 08/14/2023 12:45 PM Medical Record Number: [id number] Patient Account Number: [id number] Date of Birth/Sex: Treating RN: [birth year] (47 y.o. Ginette Pitman Primary Care Provider: [name] Other Clinician: [name] Referring Provider: Treating Provider/Extender: [name] Weeks in Treatment: 81 Constitutional Well-nourished and  03/16/2022 03/16/2022 W54.0XXA Bitten by dog, initial encounter 05/17/2023 05/17/2023 S81.801A Unspecified open wound, right lower leg, initial encounter 05/17/2023 05/17/2023 SHANIN, PARRILLI A ([id number]) [phone].pdf Page 12 of 21 E11.622 Type 2 diabetes mellitus with other skin ulcer 05/17/2023 05/17/2023 Electronic Signature(s) Signed: 08/14/2023 1:17:41 PM By: [name] PA-C Entered By: [name] on 08/14/2023 10:17:41 -------------------------------------------------------------------------------- Progress Note Details Patient Name: Date of Service: JOHNAE, IANNONE 08/14/2023 12:45 PM Medical Record Number: [id number] Patient Account Number: [id number] Date of Birth/Sex: Treating RN: February 04, 1976 (47 y.o. Ginette Pitman Primary Care Provider: [name] Other  Clinician: [name] Referring Provider: Treating Provider/Extender: [name] in Treatment: 38 Subjective Chief Complaint Information obtained from Patient Left foot ulcer History of Present Illness (HPI) 01/18/18-She is here for initial evaluation of the left great toe ulcer. She is a poor historian in regards to timeframe in detail. She states approximately 4 weeks ago she lacerated her toe on something in the house. She followed up with her primary care who placed her on Bactrim and ultimately a second dose of Bactrim prior to coming to wound clinic. She states she has been treating the toe with peroxide, Betadine and a Band-Aid. She did not check her blood sugar this morning but checked it yesterday morning it was 327; she is unaware of a recent A1c and there are no current records. She saw Dr. she would've orthopedics last week for an old injury to the left ankle, she states he did not see her toe, nor did she bring it to his attention. She smokes approximately 1 pack cigarettes a day. Her social situation is concerning, she arrives this morning with her mother who appears extremely intoxicated/under the influence; her mother was asked to leave the room and be monitored by the patient's grandmother. The patient's aunt then accompanied the patient and the room throughout the rest of the appointment. We had a lengthy discussion regarding the deleterious effects of uncontrolled hyperglycemia and smoking as it relates to wound healing and overall health. She was strongly encouraged to decrease her smoking and get her diabetes under better control. She states she is currently on a diet and has cut down her [facility] consumption. The left toe is erythematous, macerated and slightly edematous with malodor present. The edema in her left foot is below her baseline, there is no erythema streaking. We will treat her with Santyl, doxycycline; we have ordered and xray,  culture and provided a Peg assist surgical shoe and cultured the wound. 01/25/18-She is here in follow-up evaluation for a left great toe ulcer and presents with an abscess to her suprapubic area. She states her blood sugars remain elevated, feeling "sick" and if levels are below 250, but she is trying. She has made no attempt to decrease her smoking stating that we "can't take away her food in her cigarettes". She has been compliant with offloading using the PEG assist you. She is using Santyl daily. the culture obtained last week grew staph aureus and Enterococcus faecalis; continues on the doxycycline and Augmentin was added on Monday. The suprapubic area has erythema, no femoral variation, purple discoloration, minimal induration, was accessed with a cotton tip applicator with sanguinopurulent drainage, this was cultured, I suspect the current antibiotic treatment will cover and we will not add anything to her current treatment plan. She was advised to go to urgent care or ER with any change in redness, induration or fever. 02/01/18-She is here in follow-up evaluation for left great  well-hydrated in no acute distress. Respiratory normal breathing without difficulty. Psychiatric this patient is able to make decisions and demonstrates good insight into disease process. Alert and Oriented x 3. pleasant and cooperative. Notes Upon inspection patient's wound bed actually showed signs of good granulation epithelization at this point. Fortunately I do not see any signs of worsening overall and I believe that the patient is making excellent headway towards closure I am very pleased with what we are seeing today. Electronic Signature(s) Signed: 08/14/2023 1:31:26 PM By: Cathren Harsh, Ayumi A ([id number]) [id number] Page 10 of 21 Signed: 08/14/2023 1:31:26 PM By: [name] PA-C Entered By: [name] on 08/14/2023 10:31:25 -------------------------------------------------------------------------------- Physician Orders Details Patient Name: Date of Service: ZUZANA, SCHECKEL 08/14/2023 12:45 PM Medical Record Number: [id number] Patient Account Number: [id number] Date of Birth/Sex: Treating RN: 08/26/1976 (47 y.o. 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 Bathing/ Shower/ Hygiene May shower; gently cleanse wound with antibacterial soap, rinse and pat dry prior to dressing wounds No tub bath. Off-Loading Total Contact Cast to Left Lower Extremity Open toe surgical shoe - right foot Wound Treatment Wound #12R - Foot Wound Laterality: Plantar, Left, Medial Cleanser: Vashe 5.8 (oz) Discharge Instructions: Use vashe 5.8 (oz) as directed Topical: Gentamicin Discharge Instructions: Apply as directed by provider. Prim Dressing: Hydrofera Blue Ready Transfer Foam, 2.5x2.5 (in/in) ary Discharge Instructions: Apply Hydrofera Blue Ready to wound bed as directed Secondary Dressing: ABD Pad 5x9 (in/in) Discharge Instructions: Cover with ABD pad Electronic Signature(s) Signed: 08/17/2023 6:30:03 PM By: [name] PA-C Signed: 08/18/2023 10:28:30 AM By: [name] Previous Signature: 08/14/2023 4:50:43 PM Version By: [name] PA-C Previous Signature: 08/15/2023 4:53:28 PM Version By: [name] Entered By: [name] on 08/17/2023 14:07:24 Earmon Phoenix A ([id number]) [id number] Page 11 of 21 -------------------------------------------------------------------------------- Problem List Details Patient Name: Date of Service: JODDIE, DIGIORGIO 08/14/2023 12:45 PM Medical Record Number:  [id number] Patient Account Number: [id number] Date of Birth/Sex: Treating RN: 1975/12/10 (47 y.o. 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 03/16/2022 No Yes severity E11.42 Type 2 diabetes mellitus with diabetic polyneuropathy 03/16/2022 No Yes Z79.4 Long term (current) use of insulin 02/22/2023 No Yes N18.6 End stage renal disease 07/14/2023 No Yes Z99.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 07/14/2023 No Yes L84 Corns and callosities 08/01/2023 No Yes Inactive Problems Resolved Problems ICD-10 Code Description Active Date Resolved Date L97.512 Non-pressure chronic ulcer of other part of right foot with fat layer exposed 03/16/2022 03/16/2022 L97.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 of skin  Ag to the right foot wound. Antibiotic ointment to the right anterior leg wound. She completed her oral antibiotics. She has no issues or complaints today. Wounds continue to improve albeit slowly. 8/14; 8/14; patient has 3 wounds 1 on the left plantar heel 1 on the right plantar heel and a small wound on the right anterior lower leg. We have been using Hydrofera Blue in the left heel Aquacel on the right heel. The left heel has been in a total contact cast 8/21; patient presents for follow-up. We have been using Hydrofera Blue to the left foot wound under the total contact cast. T the right heel patient has been o using Aquacel Ag. T the anterior leg wound antibiotic ointment. All wounds have healed. o 8/28; patient presents for follow-up. She has been using her surgical shoe with offloading felt pads. Her wounds have remained closed. 9/11; patient presents for final follow-up to assure that her wounds have remained closed. Unfortunately the left foot wound has opened again. But she has been wearing her Surgical shoe for offloading with felt pad. 07-14-2023 upon  evaluation today patient appears to be doing well currently in regard to her wound. She has been tolerating the dressing changes without complication with total contact casting. I am going to be helping take care of her at this point as she is transferring from Dr. Mikey Bussing to me since Dr. Mikey Bussing is on maternity leave. 07-24-2023 upon evaluation today patient appears to be doing decently well currently in regard to her wound. She is actually showing signs of improvement in regard to the left foot and I feel like this is much better than last week no debridement even necessary today. We may have to perform some slight debridement come next week we will see how things go. Monitor for any signs of worsening overall and obviously if anything changes she should be letting us know. 08-01-2023 upon evaluation today patient appears to be doing excellent in regard to her feet. The right foot/heel area is completely healed she does have some callus buildup I am going to trim this down a little bit I do not want to build up to the point of cracking she is doing well with the urea cream. With regard to the left heel she is doing well with casting I am going to perform some debridement here to clear away before reapplying the cast and I think she is really doing quite well though. 08-07-2023 upon evaluation patient's wound actually showed signs of excellent improvement and actually very pleased with where we stand I do believe that she is making good headway towards complete closure which is great news. 08-14-2023 upon evaluation today patient appears to be doing well currently in regard to her wound. She actually is going require a little bit of debridement but fortunately nothing too significant today actually feel like she is doing much better. This is mainly to remove callus on the little slough and biofilm down to good subcutaneous tissue. In general I think that she is making excellent headway  here. Electronic Signature(s) Signed: 08/14/2023 1:31:11 PM By: [name] PA-C Entered By: [name] on 08/14/2023 10:31:11 -------------------------------------------------------------------------------- Physical Exam Details Patient Name: Date of Service: KISHARA, LOVELAND 08/14/2023 12:45 PM Medical Record Number: [id number] Patient Account Number: [id number] Date of Birth/Sex: Treating RN: [birth year] (47 y.o. Ginette Pitman Primary Care Provider: [name] Other Clinician: [name] Referring Provider: Treating Provider/Extender: [name] Weeks in Treatment: 81 Constitutional Well-nourished and  03/16/2022 03/16/2022 W54.0XXA Bitten by dog, initial encounter 05/17/2023 05/17/2023 S81.801A Unspecified open wound, right lower leg, initial encounter 05/17/2023 05/17/2023 SHANIN, PARRILLI A ([id number]) [phone].pdf Page 12 of 21 E11.622 Type 2 diabetes mellitus with other skin ulcer 05/17/2023 05/17/2023 Electronic Signature(s) Signed: 08/14/2023 1:17:41 PM By: [name] PA-C Entered By: [name] on 08/14/2023 10:17:41 -------------------------------------------------------------------------------- Progress Note Details Patient Name: Date of Service: JOHNAE, IANNONE 08/14/2023 12:45 PM Medical Record Number: [id number] Patient Account Number: [id number] Date of Birth/Sex: Treating RN: February 04, 1976 (47 y.o. Ginette Pitman Primary Care Provider: [name] Other  Clinician: [name] Referring Provider: Treating Provider/Extender: [name] in Treatment: 38 Subjective Chief Complaint Information obtained from Patient Left foot ulcer History of Present Illness (HPI) 01/18/18-She is here for initial evaluation of the left great toe ulcer. She is a poor historian in regards to timeframe in detail. She states approximately 4 weeks ago she lacerated her toe on something in the house. She followed up with her primary care who placed her on Bactrim and ultimately a second dose of Bactrim prior to coming to wound clinic. She states she has been treating the toe with peroxide, Betadine and a Band-Aid. She did not check her blood sugar this morning but checked it yesterday morning it was 327; she is unaware of a recent A1c and there are no current records. She saw Dr. she would've orthopedics last week for an old injury to the left ankle, she states he did not see her toe, nor did she bring it to his attention. She smokes approximately 1 pack cigarettes a day. Her social situation is concerning, she arrives this morning with her mother who appears extremely intoxicated/under the influence; her mother was asked to leave the room and be monitored by the patient's grandmother. The patient's aunt then accompanied the patient and the room throughout the rest of the appointment. We had a lengthy discussion regarding the deleterious effects of uncontrolled hyperglycemia and smoking as it relates to wound healing and overall health. She was strongly encouraged to decrease her smoking and get her diabetes under better control. She states she is currently on a diet and has cut down her [facility] consumption. The left toe is erythematous, macerated and slightly edematous with malodor present. The edema in her left foot is below her baseline, there is no erythema streaking. We will treat her with Santyl, doxycycline; we have ordered and xray,  culture and provided a Peg assist surgical shoe and cultured the wound. 01/25/18-She is here in follow-up evaluation for a left great toe ulcer and presents with an abscess to her suprapubic area. She states her blood sugars remain elevated, feeling "sick" and if levels are below 250, but she is trying. She has made no attempt to decrease her smoking stating that we "can't take away her food in her cigarettes". She has been compliant with offloading using the PEG assist you. She is using Santyl daily. the culture obtained last week grew staph aureus and Enterococcus faecalis; continues on the doxycycline and Augmentin was added on Monday. The suprapubic area has erythema, no femoral variation, purple discoloration, minimal induration, was accessed with a cotton tip applicator with sanguinopurulent drainage, this was cultured, I suspect the current antibiotic treatment will cover and we will not add anything to her current treatment plan. She was advised to go to urgent care or ER with any change in redness, induration or fever. 02/01/18-She is here in follow-up evaluation for left great  Minette A ([id number]) [id number] Page 20 of 21 Post procedure Diagnosis Wound #12R: Same as Pre-Procedure Pre-procedure diagnosis of Wound #12R is a Diabetic Wound/Ulcer of the Lower Extremity located on the Left,Medial,Plantar Foot . There was a T Contact otal Cast Procedure by [name], PA-C. Post procedure Diagnosis Wound #12R: Same as Pre-Procedure Plan Follow-up Appointments: Return Appointment in 1 week. Bathing/ Shower/ Hygiene: May shower; gently cleanse wound with antibacterial soap, rinse and pat dry prior to dressing wounds No tub bath. Off-Loading: T Contact  Cast to Left Lower Extremity otal Open toe surgical shoe - right foot WOUND #12R: - Foot Wound Laterality: Plantar, Left, Medial Cleanser: Vashe 5.8 (oz) Discharge Instructions: Use vashe 5.8 (oz) as directed Topical: Gentamicin Discharge Instructions: Apply as directed by provider. Prim Dressing: Hydrofera Blue Ready Transfer Foam, 2.5x2.5 (in/in) ary Discharge Instructions: Apply Hydrofera Blue Ready to wound bed as directed Secondary Dressing: ABD Pad 5x9 (in/in) Discharge Instructions: Cover with ABD pad 1. I am going to recommend that the patient should continue with the total contact casting along with the [facility] Blue and mupirocin. 2. I did go ahead and reapply the cast myself today as well and patient tolerated that without complication. We will see patient back for reevaluation in 1 week here in the clinic. If anything worsens or changes patient will contact our office for additional recommendations. Electronic Signature(s) Signed: 08/14/2023 1:31:42 PM By: [name] PA-C Entered By: [name] on 08/14/2023 10:31:42 -------------------------------------------------------------------------------- Total Contact Cast Details Patient Name: Date of Service: ANALIESE, DEMARTINI 08/14/2023 12:45 PM Medical Record Number: [id number] Patient Account Number: [id number] Date of Birth/Sex: Treating RN: 01/22/1976 (48 y.o. Ginette Pitman Primary Care Provider: [name] Other Clinician: [name] Referring Provider: Treating Provider/Extender: [name] Weeks in Treatment: 10 T Contact Cast Applied for Wound Assessment: otal Wound #12R Left,Medial,Plantar Foot Performed By: Physician [name], PA-C Post Procedure Diagnosis Same as Pre-procedure Electronic Signature(s) Signed: 08/14/2023 4:50:43 PM By: [name] PA-C Signed: 08/15/2023 4:53:28 PM By: [name] Entered By: [name] on 08/14/2023 10:24:25 Earmon Phoenix A ([id number])  [id number] Page 21 of 21 -------------------------------------------------------------------------------- SuperBill Details Patient Name: Date of Service: SINIAH, DAMICO 08/14/2023 Medical Record Number: [id number] Patient Account Number: [id number] Date of Birth/Sex: Treating RN: 03-Feb-1976 (47 y.o. 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 Z79.4 Long term (current) use of insulin N18.6 End stage renal disease Z99.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 L84 Corns and callosities Facility Procedures : CPT4 Code: 35009381 Description: 11042 - DEB SUBQ TISSUE 20 SQ CM/< ICD-10 Diagnosis Description L97.528 Non-pressure chronic ulcer of other part of left foot with other specified seve Modifier: rity Quantity: 1 Physician Procedures : CPT4 Code Description Modifier [id number] 11042 - WC PHYS SUBQ TISS 20 SQ CM ICD-10 Diagnosis Description L97.528 Non-pressure chronic ulcer of other part of left foot with other specified severity Quantity: 1 Electronic Signature(s) Signed: 08/14/2023 1:32:06 PM By: [name] PA-C Entered By: [name] on 08/14/2023 10:32:06  Minette A ([id number]) [id number] Page 20 of 21 Post procedure Diagnosis Wound #12R: Same as Pre-Procedure Pre-procedure diagnosis of Wound #12R is a Diabetic Wound/Ulcer of the Lower Extremity located on the Left,Medial,Plantar Foot . There was a T Contact otal Cast Procedure by [name], PA-C. Post procedure Diagnosis Wound #12R: Same as Pre-Procedure Plan Follow-up Appointments: Return Appointment in 1 week. Bathing/ Shower/ Hygiene: May shower; gently cleanse wound with antibacterial soap, rinse and pat dry prior to dressing wounds No tub bath. Off-Loading: T Contact  Cast to Left Lower Extremity otal Open toe surgical shoe - right foot WOUND #12R: - Foot Wound Laterality: Plantar, Left, Medial Cleanser: Vashe 5.8 (oz) Discharge Instructions: Use vashe 5.8 (oz) as directed Topical: Gentamicin Discharge Instructions: Apply as directed by provider. Prim Dressing: Hydrofera Blue Ready Transfer Foam, 2.5x2.5 (in/in) ary Discharge Instructions: Apply Hydrofera Blue Ready to wound bed as directed Secondary Dressing: ABD Pad 5x9 (in/in) Discharge Instructions: Cover with ABD pad 1. I am going to recommend that the patient should continue with the total contact casting along with the [facility] Blue and mupirocin. 2. I did go ahead and reapply the cast myself today as well and patient tolerated that without complication. We will see patient back for reevaluation in 1 week here in the clinic. If anything worsens or changes patient will contact our office for additional recommendations. Electronic Signature(s) Signed: 08/14/2023 1:31:42 PM By: [name] PA-C Entered By: [name] on 08/14/2023 10:31:42 -------------------------------------------------------------------------------- Total Contact Cast Details Patient Name: Date of Service: ANALIESE, DEMARTINI 08/14/2023 12:45 PM Medical Record Number: [id number] Patient Account Number: [id number] Date of Birth/Sex: Treating RN: 01/22/1976 (48 y.o. Ginette Pitman Primary Care Provider: [name] Other Clinician: [name] Referring Provider: Treating Provider/Extender: [name] Weeks in Treatment: 10 T Contact Cast Applied for Wound Assessment: otal Wound #12R Left,Medial,Plantar Foot Performed By: Physician [name], PA-C Post Procedure Diagnosis Same as Pre-procedure Electronic Signature(s) Signed: 08/14/2023 4:50:43 PM By: [name] PA-C Signed: 08/15/2023 4:53:28 PM By: [name] Entered By: [name] on 08/14/2023 10:24:25 Earmon Phoenix A ([id number])  [id number] Page 21 of 21 -------------------------------------------------------------------------------- SuperBill Details Patient Name: Date of Service: SINIAH, DAMICO 08/14/2023 Medical Record Number: [id number] Patient Account Number: [id number] Date of Birth/Sex: Treating RN: 03-Feb-1976 (47 y.o. 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 Z79.4 Long term (current) use of insulin N18.6 End stage renal disease Z99.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 L84 Corns and callosities Facility Procedures : CPT4 Code: 35009381 Description: 11042 - DEB SUBQ TISSUE 20 SQ CM/< ICD-10 Diagnosis Description L97.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 of other part of left foot with other specified seve Modifier: rity Quantity: 1 Physician Procedures : CPT4 Code Description Modifier [id number] 11042 - WC PHYS SUBQ TISS 20 SQ CM ICD-10 Diagnosis Description L97.528 Non-pressure chronic ulcer of other part of left foot with other specified severity Quantity: 1 Electronic Signature(s) Signed: 08/14/2023 1:32:06 PM By: [name] PA-C Entered By: [name] on 08/14/2023 10:32:06  Minette A ([id number]) [id number] Page 20 of 21 Post procedure Diagnosis Wound #12R: Same as Pre-Procedure Pre-procedure diagnosis of Wound #12R is a Diabetic Wound/Ulcer of the Lower Extremity located on the Left,Medial,Plantar Foot . There was a T Contact otal Cast Procedure by [name], PA-C. Post procedure Diagnosis Wound #12R: Same as Pre-Procedure Plan Follow-up Appointments: Return Appointment in 1 week. Bathing/ Shower/ Hygiene: May shower; gently cleanse wound with antibacterial soap, rinse and pat dry prior to dressing wounds No tub bath. Off-Loading: T Contact  Cast to Left Lower Extremity otal Open toe surgical shoe - right foot WOUND #12R: - Foot Wound Laterality: Plantar, Left, Medial Cleanser: Vashe 5.8 (oz) Discharge Instructions: Use vashe 5.8 (oz) as directed Topical: Gentamicin Discharge Instructions: Apply as directed by provider. Prim Dressing: Hydrofera Blue Ready Transfer Foam, 2.5x2.5 (in/in) ary Discharge Instructions: Apply Hydrofera Blue Ready to wound bed as directed Secondary Dressing: ABD Pad 5x9 (in/in) Discharge Instructions: Cover with ABD pad 1. I am going to recommend that the patient should continue with the total contact casting along with the [facility] Blue and mupirocin. 2. I did go ahead and reapply the cast myself today as well and patient tolerated that without complication. We will see patient back for reevaluation in 1 week here in the clinic. If anything worsens or changes patient will contact our office for additional recommendations. Electronic Signature(s) Signed: 08/14/2023 1:31:42 PM By: [name] PA-C Entered By: [name] on 08/14/2023 10:31:42 -------------------------------------------------------------------------------- Total Contact Cast Details Patient Name: Date of Service: ANALIESE, DEMARTINI 08/14/2023 12:45 PM Medical Record Number: [id number] Patient Account Number: [id number] Date of Birth/Sex: Treating RN: 01/22/1976 (48 y.o. Ginette Pitman Primary Care Provider: [name] Other Clinician: [name] Referring Provider: Treating Provider/Extender: [name] Weeks in Treatment: 10 T Contact Cast Applied for Wound Assessment: otal Wound #12R Left,Medial,Plantar Foot Performed By: Physician [name], PA-C Post Procedure Diagnosis Same as Pre-procedure Electronic Signature(s) Signed: 08/14/2023 4:50:43 PM By: [name] PA-C Signed: 08/15/2023 4:53:28 PM By: [name] Entered By: [name] on 08/14/2023 10:24:25 Earmon Phoenix A ([id number])  [id number] Page 21 of 21 -------------------------------------------------------------------------------- SuperBill Details Patient Name: Date of Service: SINIAH, DAMICO 08/14/2023 Medical Record Number: [id number] Patient Account Number: [id number] Date of Birth/Sex: Treating RN: 03-Feb-1976 (47 y.o. 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 Z79.4 Long term (current) use of insulin N18.6 End stage renal disease Z99.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 L84 Corns and callosities Facility Procedures : CPT4 Code: [id number] Description: 11042 - DEB SUBQ TISSUE 20 SQ CM/< ICD-10 Diagnosis Description 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 Antibiotic ointment to the right anterior leg wound. She completed her oral antibiotics. She has no issues or complaints today. Wounds continue to improve albeit slowly. 8/14; 8/14; patient has 3 wounds 1 on the left plantar heel 1 on the right plantar heel and a small wound on the right anterior lower leg. We have been using Hydrofera Blue in the left heel Aquacel on the right heel. The left heel has been in a total contact cast 8/21; patient presents for follow-up. We have been using Hydrofera Blue to the left foot wound under the total contact cast. T the right heel patient has been o using Aquacel Ag. T the anterior leg wound antibiotic ointment. All wounds have healed. o 8/28; patient presents for follow-up. She has been using her surgical shoe with offloading felt pads. Her wounds have remained closed. 9/11; patient presents for final follow-up to assure that her wounds have remained closed. Unfortunately the left foot wound has opened again. But she has been wearing her Surgical shoe for offloading with felt pad. 07-14-2023 upon  evaluation today patient appears to be doing well currently in regard to her wound. She has been tolerating the dressing changes without complication with total contact casting. I am going to be helping take care of her at this point as she is transferring from Dr. Mikey Bussing to me since Dr. Mikey Bussing is on maternity leave. 07-24-2023 upon evaluation today patient appears to be doing decently well currently in regard to her wound. She is actually showing signs of improvement in regard to the left foot and I feel like this is much better than last week no debridement even necessary today. We may have to perform some slight debridement come next week we will see how things go. Monitor for any signs of worsening overall and obviously if anything changes she should be letting us know. 08-01-2023 upon evaluation today patient appears to be doing excellent in regard to her feet. The right foot/heel area is completely healed she does have some callus buildup I am going to trim this down a little bit I do not want to build up to the point of cracking she is doing well with the urea cream. With regard to the left heel she is doing well with casting I am going to perform some debridement here to clear away before reapplying the cast and I think she is really doing quite well though. 08-07-2023 upon evaluation patient's wound actually showed signs of excellent improvement and actually very pleased with where we stand I do believe that she is making good headway towards complete closure which is great news. 08-14-2023 upon evaluation today patient appears to be doing well currently in regard to her wound. She actually is going require a little bit of debridement but fortunately nothing too significant today actually feel like she is doing much better. This is mainly to remove callus on the little slough and biofilm down to good subcutaneous tissue. In general I think that she is making excellent headway  here. Electronic Signature(s) Signed: 08/14/2023 1:31:11 PM By: [name] PA-C Entered By: [name] on 08/14/2023 10:31:11 -------------------------------------------------------------------------------- Physical Exam Details Patient Name: Date of Service: KISHARA, LOVELAND 08/14/2023 12:45 PM Medical Record Number: [id number] Patient Account Number: [id number] Date of Birth/Sex: Treating RN: [birth year] (47 y.o. Ginette Pitman Primary Care Provider: [name] Other Clinician: [name] Referring Provider: Treating Provider/Extender: [name] Weeks in Treatment: 81 Constitutional Well-nourished and  03/16/2022 03/16/2022 W54.0XXA Bitten by dog, initial encounter 05/17/2023 05/17/2023 S81.801A Unspecified open wound, right lower leg, initial encounter 05/17/2023 05/17/2023 SHANIN, PARRILLI A ([id number]) [phone].pdf Page 12 of 21 E11.622 Type 2 diabetes mellitus with other skin ulcer 05/17/2023 05/17/2023 Electronic Signature(s) Signed: 08/14/2023 1:17:41 PM By: [name] PA-C Entered By: [name] on 08/14/2023 10:17:41 -------------------------------------------------------------------------------- Progress Note Details Patient Name: Date of Service: JOHNAE, IANNONE 08/14/2023 12:45 PM Medical Record Number: [id number] Patient Account Number: [id number] Date of Birth/Sex: Treating RN: February 04, 1976 (47 y.o. Ginette Pitman Primary Care Provider: [name] Other  Clinician: [name] Referring Provider: Treating Provider/Extender: [name] in Treatment: 38 Subjective Chief Complaint Information obtained from Patient Left foot ulcer History of Present Illness (HPI) 01/18/18-She is here for initial evaluation of the left great toe ulcer. She is a poor historian in regards to timeframe in detail. She states approximately 4 weeks ago she lacerated her toe on something in the house. She followed up with her primary care who placed her on Bactrim and ultimately a second dose of Bactrim prior to coming to wound clinic. She states she has been treating the toe with peroxide, Betadine and a Band-Aid. She did not check her blood sugar this morning but checked it yesterday morning it was 327; she is unaware of a recent A1c and there are no current records. She saw Dr. she would've orthopedics last week for an old injury to the left ankle, she states he did not see her toe, nor did she bring it to his attention. She smokes approximately 1 pack cigarettes a day. Her social situation is concerning, she arrives this morning with her mother who appears extremely intoxicated/under the influence; her mother was asked to leave the room and be monitored by the patient's grandmother. The patient's aunt then accompanied the patient and the room throughout the rest of the appointment. We had a lengthy discussion regarding the deleterious effects of uncontrolled hyperglycemia and smoking as it relates to wound healing and overall health. She was strongly encouraged to decrease her smoking and get her diabetes under better control. She states she is currently on a diet and has cut down her [facility] consumption. The left toe is erythematous, macerated and slightly edematous with malodor present. The edema in her left foot is below her baseline, there is no erythema streaking. We will treat her with Santyl, doxycycline; we have ordered and xray,  culture and provided a Peg assist surgical shoe and cultured the wound. 01/25/18-She is here in follow-up evaluation for a left great toe ulcer and presents with an abscess to her suprapubic area. She states her blood sugars remain elevated, feeling "sick" and if levels are below 250, but she is trying. She has made no attempt to decrease her smoking stating that we "can't take away her food in her cigarettes". She has been compliant with offloading using the PEG assist you. She is using Santyl daily. the culture obtained last week grew staph aureus and Enterococcus faecalis; continues on the doxycycline and Augmentin was added on Monday. The suprapubic area has erythema, no femoral variation, purple discoloration, minimal induration, was accessed with a cotton tip applicator with sanguinopurulent drainage, this was cultured, I suspect the current antibiotic treatment will cover and we will not add anything to her current treatment plan. She was advised to go to urgent care or ER with any change in redness, induration or fever. 02/01/18-She is here in follow-up evaluation for left great  KATHYJO, SOLES A ([id number]) [id number] Page 1 of 21 Visit Report for 08/14/2023 Chief Complaint Document Details Patient Name: Date of Service: MALEIYAH, VISCARRA 08/14/2023 12:45 PM Medical Record Number: [id number] Patient Account Number: [id number] Date of Birth/Sex: Treating RN: [birth year] (47 y.o. Ginette Pitman Primary Care Provider: [name] Other Clinician: [name] Referring Provider: Treating Provider/Extender: [name] Weeks in Treatment: 13 Information Obtained from: Patient Chief Complaint Left foot ulcer Electronic Signature(s) Signed: 08/14/2023 1:17:49 PM By: [name] PA-C Entered By: [name] on 08/14/2023 10:17:49 -------------------------------------------------------------------------------- Debridement Details Patient Name: Date of Service: [name] 08/14/2023 12:45 PM Medical Record Number: [id number] Patient Account Number: [id number] Date of Birth/Sex: Treating RN: 07/18/1976 (47 y.o. Ginette Pitman Primary Care Provider: [name] Other Clinician: [name] Referring Provider: Treating Provider/Extender: [name] Weeks in Treatment: 73 Debridement Performed for Assessment: Wound #12R Left,Medial,Plantar Foot Performed By: Physician [name], PA-C The following information was scribed by: [name] The information was scribed for: [name] Debridement Type: Debridement Severity of Tissue Pre Debridement: Fat layer exposed Level of Consciousness (Pre-procedure): Awake and Alert Pre-procedure Verification/Time Out Yes - 13:20 Taken: Start Time: 13:20 Percent of Wound Bed Debrided: 100% T Area Debrided (cm): otal 0.07 Tissue and other material debrided: Viable, Non-Viable, Callus, Slough, Subcutaneous, Slough Level: Skin/Subcutaneous Tissue Debridement Description: Excisional Instrument: Curette Bleeding: Minimum Hemostasis Achieved:  Pressure Nuzum, Clemie A ([id number]) [id number].pdf Page 2 of 21 Response to Treatment: Procedure was tolerated well Level of Consciousness (Post- Awake and Alert procedure): Post Debridement Measurements of Total Wound Length: (cm) 0.3 Width: (cm) 0.3 Depth: (cm) 0.1 Volume: (cm) 0.007 Character of Wound/Ulcer Post Debridement: Stable Severity of Tissue Post Debridement: Fat layer exposed Post Procedure Diagnosis Same as Pre-procedure Electronic Signature(s) Signed: 08/17/2023 6:30:03 PM By: [name] PA-C Signed: 08/18/2023 10:28:30 AM By: [name] Signed: 08/18/2023 1:46:08 PM By: Midge Aver MSN RN CNS WTA Previous Signature: 08/14/2023 4:50:43 PM Version By: [name] PA-C Previous Signature: 08/14/2023 5:06:23 PM Version By: Midge Aver MSN RN CNS WTA Previous Signature: 08/15/2023 4:53:28 PM Version By: [name] Entered By: [name] on 08/17/2023 14:06:45 -------------------------------------------------------------------------------- HPI Details Patient Name: Date of Service: GLORA, JEANPHILIPPE A. 08/14/2023 12:45 PM Medical Record Number: [id number] Patient Account Number: [id number] Date of Birth/Sex: Treating RN: July 28, 1976 (47 y.o. Ginette Pitman Primary Care Provider: [name] Other Clinician: [name] Referring Provider: Treating Provider/Extender: [name] in Treatment: 76 History of Present Illness HPI Description: 01/18/18-She is here for initial evaluation of the left great toe ulcer. She is a poor historian in regards to timeframe in detail. She states approximately 4 weeks ago she lacerated her toe on something in the house. She followed up with her primary care who placed her on Bactrim and ultimately a second dose of Bactrim prior to coming to wound clinic. She states she has been treating the toe with peroxide, Betadine and a Band-Aid. She did not check her blood sugar this  morning but checked it yesterday morning it was 327; she is unaware of a recent A1c and there are no current records. She saw Dr. she would've orthopedics last week for an old injury to the left ankle, she states he did not see her toe, nor did she bring it to his attention. She smokes approximately 1 pack cigarettes a day. Her social situation is concerning, she arrives this morning with her mother who appears extremely intoxicated/under the influence;  KATHYJO, SOLES A ([id number]) [id number] Page 1 of 21 Visit Report for 08/14/2023 Chief Complaint Document Details Patient Name: Date of Service: MALEIYAH, VISCARRA 08/14/2023 12:45 PM Medical Record Number: [id number] Patient Account Number: [id number] Date of Birth/Sex: Treating RN: [birth year] (47 y.o. Ginette Pitman Primary Care Provider: [name] Other Clinician: [name] Referring Provider: Treating Provider/Extender: [name] Weeks in Treatment: 13 Information Obtained from: Patient Chief Complaint Left foot ulcer Electronic Signature(s) Signed: 08/14/2023 1:17:49 PM By: [name] PA-C Entered By: [name] on 08/14/2023 10:17:49 -------------------------------------------------------------------------------- Debridement Details Patient Name: Date of Service: [name] 08/14/2023 12:45 PM Medical Record Number: [id number] Patient Account Number: [id number] Date of Birth/Sex: Treating RN: 07/18/1976 (47 y.o. Ginette Pitman Primary Care Provider: [name] Other Clinician: [name] Referring Provider: Treating Provider/Extender: [name] Weeks in Treatment: 73 Debridement Performed for Assessment: Wound #12R Left,Medial,Plantar Foot Performed By: Physician [name], PA-C The following information was scribed by: [name] The information was scribed for: [name] Debridement Type: Debridement Severity of Tissue Pre Debridement: Fat layer exposed Level of Consciousness (Pre-procedure): Awake and Alert Pre-procedure Verification/Time Out Yes - 13:20 Taken: Start Time: 13:20 Percent of Wound Bed Debrided: 100% T Area Debrided (cm): otal 0.07 Tissue and other material debrided: Viable, Non-Viable, Callus, Slough, Subcutaneous, Slough Level: Skin/Subcutaneous Tissue Debridement Description: Excisional Instrument: Curette Bleeding: Minimum Hemostasis Achieved:  Pressure Nuzum, Clemie A ([id number]) [id number] Page 2 of 21 Response to Treatment: Procedure was tolerated well Level of Consciousness (Post- Awake and Alert procedure): Post Debridement Measurements of Total Wound Length: (cm) 0.3 Width: (cm) 0.3 Depth: (cm) 0.1 Volume: (cm) 0.007 Character of Wound/Ulcer Post Debridement: Stable Severity of Tissue Post Debridement: Fat layer exposed Post Procedure Diagnosis Same as Pre-procedure Electronic Signature(s) Signed: 08/17/2023 6:30:03 PM By: [name] PA-C Signed: 08/18/2023 10:28:30 AM By: [name] Signed: 08/18/2023 1:46:08 PM By: Midge Aver MSN RN CNS WTA Previous Signature: 08/14/2023 4:50:43 PM Version By: [name] PA-C Previous Signature: 08/14/2023 5:06:23 PM Version By: Midge Aver MSN RN CNS WTA Previous Signature: 08/15/2023 4:53:28 PM Version By: [name] Entered By: [name] on 08/17/2023 14:06:45 -------------------------------------------------------------------------------- HPI Details Patient Name: Date of Service: GLORA, JEANPHILIPPE A. 08/14/2023 12:45 PM Medical Record Number: [id number] Patient Account Number: [id number] Date of Birth/Sex: Treating RN: July 28, 1976 (47 y.o. Ginette Pitman Primary Care Provider: [name] Other Clinician: [name] Referring Provider: Treating Provider/Extender: [name] in Treatment: 76 History of Present Illness HPI Description: 01/18/18-She is here for initial evaluation of the left great toe ulcer. She is a poor historian in regards to timeframe in detail. She states approximately 4 weeks ago she lacerated her toe on something in the house. She followed up with her primary care who placed her on Bactrim and ultimately a second dose of Bactrim prior to coming to wound clinic. She states she has been treating the toe with peroxide, Betadine and a Band-Aid. She did not check her blood sugar this  morning but checked it yesterday morning it was 327; she is unaware of a recent A1c and there are no current records. She saw Dr. she would've orthopedics last week for an old injury to the left ankle, she states he did not see her toe, nor did she bring it to his attention. She smokes approximately 1 pack cigarettes a day. Her social situation is concerning, she arrives this morning with her mother who appears extremely intoxicated/under the influence;  well-hydrated in no acute distress. Respiratory normal breathing without difficulty. Psychiatric this patient is able to make decisions and demonstrates good insight into disease process. Alert and Oriented x 3. pleasant and cooperative. Notes Upon inspection patient's wound bed actually showed signs of good granulation epithelization at this point. Fortunately I do not see any signs of worsening overall and I believe that the patient is making excellent headway towards closure I am very pleased with what we are seeing today. Electronic Signature(s) Signed: 08/14/2023 1:31:26 PM By: Cathren Harsh, Ayumi A ([id number]) [id number] Page 10 of 21 Signed: 08/14/2023 1:31:26 PM By: [name] PA-C Entered By: [name] on 08/14/2023 10:31:25 -------------------------------------------------------------------------------- Physician Orders Details Patient Name: Date of Service: ZUZANA, SCHECKEL 08/14/2023 12:45 PM Medical Record Number: [id number] Patient Account Number: [id number] Date of Birth/Sex: Treating RN: 08/26/1976 (47 y.o. 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 Bathing/ Shower/ Hygiene May shower; gently cleanse wound with antibacterial soap, rinse and pat dry prior to dressing wounds No tub bath. Off-Loading Total Contact Cast to Left Lower Extremity Open toe surgical shoe - right foot Wound Treatment Wound #12R - Foot Wound Laterality: Plantar, Left, Medial Cleanser: Vashe 5.8 (oz) Discharge Instructions: Use vashe 5.8 (oz) as directed Topical: Gentamicin Discharge Instructions: Apply as directed by provider. Prim Dressing: Hydrofera Blue Ready Transfer Foam, 2.5x2.5 (in/in) ary Discharge Instructions: Apply Hydrofera Blue Ready to wound bed as directed Secondary Dressing: ABD Pad 5x9 (in/in) Discharge Instructions: Cover with ABD pad Electronic Signature(s) Signed: 08/17/2023 6:30:03 PM By: [name] PA-C Signed: 08/18/2023 10:28:30 AM By: [name] Previous Signature: 08/14/2023 4:50:43 PM Version By: [name] PA-C Previous Signature: 08/15/2023 4:53:28 PM Version By: [name] Entered By: [name] on 08/17/2023 14:07:24 Earmon Phoenix A ([id number]) [id number] Page 11 of 21 -------------------------------------------------------------------------------- Problem List Details Patient Name: Date of Service: JODDIE, DIGIORGIO 08/14/2023 12:45 PM Medical Record Number:  [id number] Patient Account Number: [id number] Date of Birth/Sex: Treating RN: 1975/12/10 (47 y.o. 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 03/16/2022 No Yes severity E11.42 Type 2 diabetes mellitus with diabetic polyneuropathy 03/16/2022 No Yes Z79.4 Long term (current) use of insulin 02/22/2023 No Yes N18.6 End stage renal disease 07/14/2023 No Yes Z99.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 07/14/2023 No Yes L84 Corns and callosities 08/01/2023 No Yes Inactive Problems Resolved Problems ICD-10 Code Description Active Date Resolved Date L97.512 Non-pressure chronic ulcer of other part of right foot with fat layer exposed 03/16/2022 03/16/2022 L97.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 of skin  well-hydrated in no acute distress. Respiratory normal breathing without difficulty. Psychiatric this patient is able to make decisions and demonstrates good insight into disease process. Alert and Oriented x 3. pleasant and cooperative. Notes Upon inspection patient's wound bed actually showed signs of good granulation epithelization at this point. Fortunately I do not see any signs of worsening overall and I believe that the patient is making excellent headway towards closure I am very pleased with what we are seeing today. Electronic Signature(s) Signed: 08/14/2023 1:31:26 PM By: Cathren Harsh, Ayumi A ([id number]) [id number] Page 10 of 21 Signed: 08/14/2023 1:31:26 PM By: [name] PA-C Entered By: [name] on 08/14/2023 10:31:25 -------------------------------------------------------------------------------- Physician Orders Details Patient Name: Date of Service: ZUZANA, SCHECKEL 08/14/2023 12:45 PM Medical Record Number: [id number] Patient Account Number: [id number] Date of Birth/Sex: Treating RN: 08/26/1976 (47 y.o. 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 Bathing/ Shower/ Hygiene May shower; gently cleanse wound with antibacterial soap, rinse and pat dry prior to dressing wounds No tub bath. Off-Loading Total Contact Cast to Left Lower Extremity Open toe surgical shoe - right foot Wound Treatment Wound #12R - Foot Wound Laterality: Plantar, Left, Medial Cleanser: Vashe 5.8 (oz) Discharge Instructions: Use vashe 5.8 (oz) as directed Topical: Gentamicin Discharge Instructions: Apply as directed by provider. Prim Dressing: Hydrofera Blue Ready Transfer Foam, 2.5x2.5 (in/in) ary Discharge Instructions: Apply Hydrofera Blue Ready to wound bed as directed Secondary Dressing: ABD Pad 5x9 (in/in) Discharge Instructions: Cover with ABD pad Electronic Signature(s) Signed: 08/17/2023 6:30:03 PM By: [name] PA-C Signed: 08/18/2023 10:28:30 AM By: [name] Previous Signature: 08/14/2023 4:50:43 PM Version By: [name] PA-C Previous Signature: 08/15/2023 4:53:28 PM Version By: [name] Entered By: [name] on 08/17/2023 14:07:24 Earmon Phoenix A ([id number]) [id number] Page 11 of 21 -------------------------------------------------------------------------------- Problem List Details Patient Name: Date of Service: JODDIE, DIGIORGIO 08/14/2023 12:45 PM Medical Record Number:  [id number] Patient Account Number: [id number] Date of Birth/Sex: Treating RN: 1975/12/10 (47 y.o. 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 03/16/2022 No Yes severity E11.42 Type 2 diabetes mellitus with diabetic polyneuropathy 03/16/2022 No Yes Z79.4 Long term (current) use of insulin 02/22/2023 No Yes N18.6 End stage renal disease 07/14/2023 No Yes Z99.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 07/14/2023 No Yes L84 Corns and callosities 08/01/2023 No Yes Inactive Problems Resolved Problems ICD-10 Code Description Active Date Resolved Date L97.512 Non-pressure chronic ulcer of other part of right foot with fat layer exposed 03/16/2022 03/16/2022 L97.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 of skin  well-hydrated in no acute distress. Respiratory normal breathing without difficulty. Psychiatric this patient is able to make decisions and demonstrates good insight into disease process. Alert and Oriented x 3. pleasant and cooperative. Notes Upon inspection patient's wound bed actually showed signs of good granulation epithelization at this point. Fortunately I do not see any signs of worsening overall and I believe that the patient is making excellent headway towards closure I am very pleased with what we are seeing today. Electronic Signature(s) Signed: 08/14/2023 1:31:26 PM By: Cathren Harsh, Ayumi A ([id number]) [id number] Page 10 of 21 Signed: 08/14/2023 1:31:26 PM By: [name] PA-C Entered By: [name] on 08/14/2023 10:31:25 -------------------------------------------------------------------------------- Physician Orders Details Patient Name: Date of Service: ZUZANA, SCHECKEL 08/14/2023 12:45 PM Medical Record Number: [id number] Patient Account Number: [id number] Date of Birth/Sex: Treating RN: 08/26/1976 (47 y.o. 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 Bathing/ Shower/ Hygiene May shower; gently cleanse wound with antibacterial soap, rinse and pat dry prior to dressing wounds No tub bath. Off-Loading Total Contact Cast to Left Lower Extremity Open toe surgical shoe - right foot Wound Treatment Wound #12R - Foot Wound Laterality: Plantar, Left, Medial Cleanser: Vashe 5.8 (oz) Discharge Instructions: Use vashe 5.8 (oz) as directed Topical: Gentamicin Discharge Instructions: Apply as directed by provider. Prim Dressing: Hydrofera Blue Ready Transfer Foam, 2.5x2.5 (in/in) ary Discharge Instructions: Apply Hydrofera Blue Ready to wound bed as directed Secondary Dressing: ABD Pad 5x9 (in/in) Discharge Instructions: Cover with ABD pad Electronic Signature(s) Signed: 08/17/2023 6:30:03 PM By: [name] PA-C Signed: 08/18/2023 10:28:30 AM By: [name] Previous Signature: 08/14/2023 4:50:43 PM Version By: [name] PA-C Previous Signature: 08/15/2023 4:53:28 PM Version By: [name] Entered By: [name] on 08/17/2023 14:07:24 Earmon Phoenix A ([id number]) [id number] Page 11 of 21 -------------------------------------------------------------------------------- Problem List Details Patient Name: Date of Service: JODDIE, DIGIORGIO 08/14/2023 12:45 PM Medical Record Number:  [id number] Patient Account Number: [id number] Date of Birth/Sex: Treating RN: 1975/12/10 (47 y.o. 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 03/16/2022 No Yes severity E11.42 Type 2 diabetes mellitus with diabetic polyneuropathy 03/16/2022 No Yes Z79.4 Long term (current) use of insulin 02/22/2023 No Yes N18.6 End stage renal disease 07/14/2023 No Yes Z99.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 07/14/2023 No Yes L84 Corns and callosities 08/01/2023 No Yes Inactive Problems Resolved Problems ICD-10 Code Description Active Date Resolved Date L97.512 Non-pressure chronic ulcer of other part of right foot with fat layer exposed 03/16/2022 03/16/2022 L97.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 of skin  well-hydrated in no acute distress. Respiratory normal breathing without difficulty. Psychiatric this patient is able to make decisions and demonstrates good insight into disease process. Alert and Oriented x 3. pleasant and cooperative. Notes Upon inspection patient's wound bed actually showed signs of good granulation epithelization at this point. Fortunately I do not see any signs of worsening overall and I believe that the patient is making excellent headway towards closure I am very pleased with what we are seeing today. Electronic Signature(s) Signed: 08/14/2023 1:31:26 PM By: Cathren Harsh, Ayumi A ([id number]) [id number] Page 10 of 21 Signed: 08/14/2023 1:31:26 PM By: [name] PA-C Entered By: [name] on 08/14/2023 10:31:25 -------------------------------------------------------------------------------- Physician Orders Details Patient Name: Date of Service: ZUZANA, SCHECKEL 08/14/2023 12:45 PM Medical Record Number: [id number] Patient Account Number: [id number] Date of Birth/Sex: Treating RN: 08/26/1976 (47 y.o. 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 Bathing/ Shower/ Hygiene May shower; gently cleanse wound with antibacterial soap, rinse and pat dry prior to dressing wounds No tub bath. Off-Loading Total Contact Cast to Left Lower Extremity Open toe surgical shoe - right foot Wound Treatment Wound #12R - Foot Wound Laterality: Plantar, Left, Medial Cleanser: Vashe 5.8 (oz) Discharge Instructions: Use vashe 5.8 (oz) as directed Topical: Gentamicin Discharge Instructions: Apply as directed by provider. Prim Dressing: Hydrofera Blue Ready Transfer Foam, 2.5x2.5 (in/in) ary Discharge Instructions: Apply Hydrofera Blue Ready to wound bed as directed Secondary Dressing: ABD Pad 5x9 (in/in) Discharge Instructions: Cover with ABD pad Electronic Signature(s) Signed: 08/17/2023 6:30:03 PM By: [name] PA-C Signed: 08/18/2023 10:28:30 AM By: [name] Previous Signature: 08/14/2023 4:50:43 PM Version By: [name] PA-C Previous Signature: 08/15/2023 4:53:28 PM Version By: [name] Entered By: [name] on 08/17/2023 14:07:24 Earmon Phoenix A ([id number]) [id number] Page 11 of 21 -------------------------------------------------------------------------------- Problem List Details Patient Name: Date of Service: JODDIE, DIGIORGIO 08/14/2023 12:45 PM Medical Record Number:  [id number] Patient Account Number: [id number] Date of Birth/Sex: Treating RN: 1975/12/10 (47 y.o. 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 03/16/2022 No Yes severity E11.42 Type 2 diabetes mellitus with diabetic polyneuropathy 03/16/2022 No Yes Z79.4 Long term (current) use of insulin 02/22/2023 No Yes N18.6 End stage renal disease 07/14/2023 No Yes Z99.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 07/14/2023 No Yes L84 Corns and callosities 08/01/2023 No Yes Inactive Problems Resolved Problems ICD-10 Code Description Active Date Resolved Date L97.512 Non-pressure chronic ulcer of other part of right foot with fat layer exposed 03/16/2022 03/16/2022 L97.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 of skin  Minette A ([id number]) [id number] Page 20 of 21 Post procedure Diagnosis Wound #12R: Same as Pre-Procedure Pre-procedure diagnosis of Wound #12R is a Diabetic Wound/Ulcer of the Lower Extremity located on the Left,Medial,Plantar Foot . There was a T Contact otal Cast Procedure by [name], PA-C. Post procedure Diagnosis Wound #12R: Same as Pre-Procedure Plan Follow-up Appointments: Return Appointment in 1 week. Bathing/ Shower/ Hygiene: May shower; gently cleanse wound with antibacterial soap, rinse and pat dry prior to dressing wounds No tub bath. Off-Loading: T Contact  Cast to Left Lower Extremity otal Open toe surgical shoe - right foot WOUND #12R: - Foot Wound Laterality: Plantar, Left, Medial Cleanser: Vashe 5.8 (oz) Discharge Instructions: Use vashe 5.8 (oz) as directed Topical: Gentamicin Discharge Instructions: Apply as directed by provider. Prim Dressing: Hydrofera Blue Ready Transfer Foam, 2.5x2.5 (in/in) ary Discharge Instructions: Apply Hydrofera Blue Ready to wound bed as directed Secondary Dressing: ABD Pad 5x9 (in/in) Discharge Instructions: Cover with ABD pad 1. I am going to recommend that the patient should continue with the total contact casting along with the [facility] Blue and mupirocin. 2. I did go ahead and reapply the cast myself today as well and patient tolerated that without complication. We will see patient back for reevaluation in 1 week here in the clinic. If anything worsens or changes patient will contact our office for additional recommendations. Electronic Signature(s) Signed: 08/14/2023 1:31:42 PM By: [name] PA-C Entered By: [name] on 08/14/2023 10:31:42 -------------------------------------------------------------------------------- Total Contact Cast Details Patient Name: Date of Service: ANALIESE, DEMARTINI 08/14/2023 12:45 PM Medical Record Number: [id number] Patient Account Number: [id number] Date of Birth/Sex: Treating RN: 01/22/1976 (48 y.o. Ginette Pitman Primary Care Provider: [name] Other Clinician: [name] Referring Provider: Treating Provider/Extender: [name] Weeks in Treatment: 10 T Contact Cast Applied for Wound Assessment: otal Wound #12R Left,Medial,Plantar Foot Performed By: Physician [name], PA-C Post Procedure Diagnosis Same as Pre-procedure Electronic Signature(s) Signed: 08/14/2023 4:50:43 PM By: [name] PA-C Signed: 08/15/2023 4:53:28 PM By: [name] Entered By: [name] on 08/14/2023 10:24:25 Earmon Phoenix A ([id number])  [id number] Page 21 of 21 -------------------------------------------------------------------------------- SuperBill Details Patient Name: Date of Service: SINIAH, DAMICO 08/14/2023 Medical Record Number: [id number] Patient Account Number: [id number] Date of Birth/Sex: Treating RN: 03-Feb-1976 (47 y.o. 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 Z79.4 Long term (current) use of insulin N18.6 End stage renal disease Z99.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 L84 Corns and callosities Facility Procedures : CPT4 Code: [id number] Description: 11042 - DEB SUBQ TISSUE 20 SQ CM/< ICD-10 Diagnosis Description L97.528 Non-pressure chronic ulcer of other part of left foot with other specified seve Modifier: rity Quantity: 1 Physician Procedures : CPT4 Code Description Modifier [id number] 11042 - WC PHYS SUBQ TISS 20 SQ CM ICD-10 Diagnosis Description L97.528 Non-pressure chronic ulcer of other part of left foot with other specified severity Quantity: 1 Electronic Signature(s) Signed: 08/14/2023 1:32:06 PM By: [name] PA-C Entered By: [name] on 08/14/2023 10:32:06  03/16/2022 03/16/2022 W54.0XXA Bitten by dog, initial encounter 05/17/2023 05/17/2023 S81.801A Unspecified open wound, right lower leg, initial encounter 05/17/2023 05/17/2023 SHANIN, PARRILLI A ([id number]) [phone].pdf Page 12 of 21 E11.622 Type 2 diabetes mellitus with other skin ulcer 05/17/2023 05/17/2023 Electronic Signature(s) Signed: 08/14/2023 1:17:41 PM By: [name] PA-C Entered By: [name] on 08/14/2023 10:17:41 -------------------------------------------------------------------------------- Progress Note Details Patient Name: Date of Service: JOHNAE, IANNONE 08/14/2023 12:45 PM Medical Record Number: [id number] Patient Account Number: [id number] Date of Birth/Sex: Treating RN: February 04, 1976 (47 y.o. Ginette Pitman Primary Care Provider: [name] Other  Clinician: [name] Referring Provider: Treating Provider/Extender: [name] in Treatment: 38 Subjective Chief Complaint Information obtained from Patient Left foot ulcer History of Present Illness (HPI) 01/18/18-She is here for initial evaluation of the left great toe ulcer. She is a poor historian in regards to timeframe in detail. She states approximately 4 weeks ago she lacerated her toe on something in the house. She followed up with her primary care who placed her on Bactrim and ultimately a second dose of Bactrim prior to coming to wound clinic. She states she has been treating the toe with peroxide, Betadine and a Band-Aid. She did not check her blood sugar this morning but checked it yesterday morning it was 327; she is unaware of a recent A1c and there are no current records. She saw Dr. she would've orthopedics last week for an old injury to the left ankle, she states he did not see her toe, nor did she bring it to his attention. She smokes approximately 1 pack cigarettes a day. Her social situation is concerning, she arrives this morning with her mother who appears extremely intoxicated/under the influence; her mother was asked to leave the room and be monitored by the patient's grandmother. The patient's aunt then accompanied the patient and the room throughout the rest of the appointment. We had a lengthy discussion regarding the deleterious effects of uncontrolled hyperglycemia and smoking as it relates to wound healing and overall health. She was strongly encouraged to decrease her smoking and get her diabetes under better control. She states she is currently on a diet and has cut down her [facility] consumption. The left toe is erythematous, macerated and slightly edematous with malodor present. The edema in her left foot is below her baseline, there is no erythema streaking. We will treat her with Santyl, doxycycline; we have ordered and xray,  culture and provided a Peg assist surgical shoe and cultured the wound. 01/25/18-She is here in follow-up evaluation for a left great toe ulcer and presents with an abscess to her suprapubic area. She states her blood sugars remain elevated, feeling "sick" and if levels are below 250, but she is trying. She has made no attempt to decrease her smoking stating that we "can't take away her food in her cigarettes". She has been compliant with offloading using the PEG assist you. She is using Santyl daily. the culture obtained last week grew staph aureus and Enterococcus faecalis; continues on the doxycycline and Augmentin was added on Monday. The suprapubic area has erythema, no femoral variation, purple discoloration, minimal induration, was accessed with a cotton tip applicator with sanguinopurulent drainage, this was cultured, I suspect the current antibiotic treatment will cover and we will not add anything to her current treatment plan. She was advised to go to urgent care or ER with any change in redness, induration or fever. 02/01/18-She is here in follow-up evaluation for left great  Ag to the right foot wound. Antibiotic ointment to the right anterior leg wound. She completed her oral antibiotics. She has no issues or complaints today. Wounds continue to improve albeit slowly. 8/14; 8/14; patient has 3 wounds 1 on the left plantar heel 1 on the right plantar heel and a small wound on the right anterior lower leg. We have been using Hydrofera Blue in the left heel Aquacel on the right heel. The left heel has been in a total contact cast 8/21; patient presents for follow-up. We have been using Hydrofera Blue to the left foot wound under the total contact cast. T the right heel patient has been o using Aquacel Ag. T the anterior leg wound antibiotic ointment. All wounds have healed. o 8/28; patient presents for follow-up. She has been using her surgical shoe with offloading felt pads. Her wounds have remained closed. 9/11; patient presents for final follow-up to assure that her wounds have remained closed. Unfortunately the left foot wound has opened again. But she has been wearing her Surgical shoe for offloading with felt pad. 07-14-2023 upon  evaluation today patient appears to be doing well currently in regard to her wound. She has been tolerating the dressing changes without complication with total contact casting. I am going to be helping take care of her at this point as she is transferring from Dr. Mikey Bussing to me since Dr. Mikey Bussing is on maternity leave. 07-24-2023 upon evaluation today patient appears to be doing decently well currently in regard to her wound. She is actually showing signs of improvement in regard to the left foot and I feel like this is much better than last week no debridement even necessary today. We may have to perform some slight debridement come next week we will see how things go. Monitor for any signs of worsening overall and obviously if anything changes she should be letting us know. 08-01-2023 upon evaluation today patient appears to be doing excellent in regard to her feet. The right foot/heel area is completely healed she does have some callus buildup I am going to trim this down a little bit I do not want to build up to the point of cracking she is doing well with the urea cream. With regard to the left heel she is doing well with casting I am going to perform some debridement here to clear away before reapplying the cast and I think she is really doing quite well though. 08-07-2023 upon evaluation patient's wound actually showed signs of excellent improvement and actually very pleased with where we stand I do believe that she is making good headway towards complete closure which is great news. 08-14-2023 upon evaluation today patient appears to be doing well currently in regard to her wound. She actually is going require a little bit of debridement but fortunately nothing too significant today actually feel like she is doing much better. This is mainly to remove callus on the little slough and biofilm down to good subcutaneous tissue. In general I think that she is making excellent headway  here. Electronic Signature(s) Signed: 08/14/2023 1:31:11 PM By: [name] PA-C Entered By: [name] on 08/14/2023 10:31:11 -------------------------------------------------------------------------------- Physical Exam Details Patient Name: Date of Service: KISHARA, LOVELAND 08/14/2023 12:45 PM Medical Record Number: [id number] Patient Account Number: [id number] Date of Birth/Sex: Treating RN: [birth year] (47 y.o. Ginette Pitman Primary Care Provider: [name] Other Clinician: [name] Referring Provider: Treating Provider/Extender: [name] Weeks in Treatment: 81 Constitutional Well-nourished and  KATHYJO, SOLES A ([id number]) [id number] Page 1 of 21 Visit Report for 08/14/2023 Chief Complaint Document Details Patient Name: Date of Service: MALEIYAH, VISCARRA 08/14/2023 12:45 PM Medical Record Number: [id number] Patient Account Number: [id number] Date of Birth/Sex: Treating RN: [birth year] (47 y.o. Ginette Pitman Primary Care Provider: [name] Other Clinician: [name] Referring Provider: Treating Provider/Extender: [name] Weeks in Treatment: 13 Information Obtained from: Patient Chief Complaint Left foot ulcer Electronic Signature(s) Signed: 08/14/2023 1:17:49 PM By: [name] PA-C Entered By: [name] on 08/14/2023 10:17:49 -------------------------------------------------------------------------------- Debridement Details Patient Name: Date of Service: [name] 08/14/2023 12:45 PM Medical Record Number: [id number] Patient Account Number: [id number] Date of Birth/Sex: Treating RN: 07/18/1976 (47 y.o. Ginette Pitman Primary Care Provider: [name] Other Clinician: [name] Referring Provider: Treating Provider/Extender: [name] Weeks in Treatment: 73 Debridement Performed for Assessment: Wound #12R Left,Medial,Plantar Foot Performed By: Physician [name], PA-C The following information was scribed by: [name] The information was scribed for: [name] Debridement Type: Debridement Severity of Tissue Pre Debridement: Fat layer exposed Level of Consciousness (Pre-procedure): Awake and Alert Pre-procedure Verification/Time Out Yes - 13:20 Taken: Start Time: 13:20 Percent of Wound Bed Debrided: 100% T Area Debrided (cm): otal 0.07 Tissue and other material debrided: Viable, Non-Viable, Callus, Slough, Subcutaneous, Slough Level: Skin/Subcutaneous Tissue Debridement Description: Excisional Instrument: Curette Bleeding: Minimum Hemostasis Achieved:  Pressure Nuzum, Clemie A ([id number]) [id number].pdf Page 2 of 21 Response to Treatment: Procedure was tolerated well Level of Consciousness (Post- Awake and Alert procedure): Post Debridement Measurements of Total Wound Length: (cm) 0.3 Width: (cm) 0.3 Depth: (cm) 0.1 Volume: (cm) 0.007 Character of Wound/Ulcer Post Debridement: Stable Severity of Tissue Post Debridement: Fat layer exposed Post Procedure Diagnosis Same as Pre-procedure Electronic Signature(s) Signed: 08/17/2023 6:30:03 PM By: [name] PA-C Signed: 08/18/2023 10:28:30 AM By: [name] Signed: 08/18/2023 1:46:08 PM By: Midge Aver MSN RN CNS WTA Previous Signature: 08/14/2023 4:50:43 PM Version By: [name] PA-C Previous Signature: 08/14/2023 5:06:23 PM Version By: Midge Aver MSN RN CNS WTA Previous Signature: 08/15/2023 4:53:28 PM Version By: [name] Entered By: [name] on 08/17/2023 14:06:45 -------------------------------------------------------------------------------- HPI Details Patient Name: Date of Service: GLORA, JEANPHILIPPE A. 08/14/2023 12:45 PM Medical Record Number: [id number] Patient Account Number: [id number] Date of Birth/Sex: Treating RN: July 28, 1976 (47 y.o. Ginette Pitman Primary Care Provider: [name] Other Clinician: [name] Referring Provider: Treating Provider/Extender: [name] in Treatment: 76 History of Present Illness HPI Description: 01/18/18-She is here for initial evaluation of the left great toe ulcer. She is a poor historian in regards to timeframe in detail. She states approximately 4 weeks ago she lacerated her toe on something in the house. She followed up with her primary care who placed her on Bactrim and ultimately a second dose of Bactrim prior to coming to wound clinic. She states she has been treating the toe with peroxide, Betadine and a Band-Aid. She did not check her blood sugar this  morning but checked it yesterday morning it was 327; she is unaware of a recent A1c and there are no current records. She saw Dr. she would've orthopedics last week for an old injury to the left ankle, she states he did not see her toe, nor did she bring it to his attention. She smokes approximately 1 pack cigarettes a day. Her social situation is concerning, she arrives this morning with her mother who appears extremely intoxicated/under the influence;  03/16/2022 03/16/2022 W54.0XXA Bitten by dog, initial encounter 05/17/2023 05/17/2023 S81.801A Unspecified open wound, right lower leg, initial encounter 05/17/2023 05/17/2023 SHANIN, PARRILLI A ([id number]) [phone].pdf Page 12 of 21 E11.622 Type 2 diabetes mellitus with other skin ulcer 05/17/2023 05/17/2023 Electronic Signature(s) Signed: 08/14/2023 1:17:41 PM By: [name] PA-C Entered By: [name] on 08/14/2023 10:17:41 -------------------------------------------------------------------------------- Progress Note Details Patient Name: Date of Service: JOHNAE, IANNONE 08/14/2023 12:45 PM Medical Record Number: [id number] Patient Account Number: [id number] Date of Birth/Sex: Treating RN: February 04, 1976 (47 y.o. Ginette Pitman Primary Care Provider: [name] Other  Clinician: [name] Referring Provider: Treating Provider/Extender: [name] in Treatment: 38 Subjective Chief Complaint Information obtained from Patient Left foot ulcer History of Present Illness (HPI) 01/18/18-She is here for initial evaluation of the left great toe ulcer. She is a poor historian in regards to timeframe in detail. She states approximately 4 weeks ago she lacerated her toe on something in the house. She followed up with her primary care who placed her on Bactrim and ultimately a second dose of Bactrim prior to coming to wound clinic. She states she has been treating the toe with peroxide, Betadine and a Band-Aid. She did not check her blood sugar this morning but checked it yesterday morning it was 327; she is unaware of a recent A1c and there are no current records. She saw Dr. she would've orthopedics last week for an old injury to the left ankle, she states he did not see her toe, nor did she bring it to his attention. She smokes approximately 1 pack cigarettes a day. Her social situation is concerning, she arrives this morning with her mother who appears extremely intoxicated/under the influence; her mother was asked to leave the room and be monitored by the patient's grandmother. The patient's aunt then accompanied the patient and the room throughout the rest of the appointment. We had a lengthy discussion regarding the deleterious effects of uncontrolled hyperglycemia and smoking as it relates to wound healing and overall health. She was strongly encouraged to decrease her smoking and get her diabetes under better control. She states she is currently on a diet and has cut down her [facility] consumption. The left toe is erythematous, macerated and slightly edematous with malodor present. The edema in her left foot is below her baseline, there is no erythema streaking. We will treat her with Santyl, doxycycline; we have ordered and xray,  culture and provided a Peg assist surgical shoe and cultured the wound. 01/25/18-She is here in follow-up evaluation for a left great toe ulcer and presents with an abscess to her suprapubic area. She states her blood sugars remain elevated, feeling "sick" and if levels are below 250, but she is trying. She has made no attempt to decrease her smoking stating that we "can't take away her food in her cigarettes". She has been compliant with offloading using the PEG assist you. She is using Santyl daily. the culture obtained last week grew staph aureus and Enterococcus faecalis; continues on the doxycycline and Augmentin was added on Monday. The suprapubic area has erythema, no femoral variation, purple discoloration, minimal induration, was accessed with a cotton tip applicator with sanguinopurulent drainage, this was cultured, I suspect the current antibiotic treatment will cover and we will not add anything to her current treatment plan. She was advised to go to urgent care or ER with any change in redness, induration or fever. 02/01/18-She is here in follow-up evaluation for left great  Ag to the right foot wound. Antibiotic ointment to the right anterior leg wound. She completed her oral antibiotics. She has no issues or complaints today. Wounds continue to improve albeit slowly. 8/14; 8/14; patient has 3 wounds 1 on the left plantar heel 1 on the right plantar heel and a small wound on the right anterior lower leg. We have been using Hydrofera Blue in the left heel Aquacel on the right heel. The left heel has been in a total contact cast 8/21; patient presents for follow-up. We have been using Hydrofera Blue to the left foot wound under the total contact cast. T the right heel patient has been o using Aquacel Ag. T the anterior leg wound antibiotic ointment. All wounds have healed. o 8/28; patient presents for follow-up. She has been using her surgical shoe with offloading felt pads. Her wounds have remained closed. 9/11; patient presents for final follow-up to assure that her wounds have remained closed. Unfortunately the left foot wound has opened again. But she has been wearing her Surgical shoe for offloading with felt pad. 07-14-2023 upon  evaluation today patient appears to be doing well currently in regard to her wound. She has been tolerating the dressing changes without complication with total contact casting. I am going to be helping take care of her at this point as she is transferring from Dr. Mikey Bussing to me since Dr. Mikey Bussing is on maternity leave. 07-24-2023 upon evaluation today patient appears to be doing decently well currently in regard to her wound. She is actually showing signs of improvement in regard to the left foot and I feel like this is much better than last week no debridement even necessary today. We may have to perform some slight debridement come next week we will see how things go. Monitor for any signs of worsening overall and obviously if anything changes she should be letting us know. 08-01-2023 upon evaluation today patient appears to be doing excellent in regard to her feet. The right foot/heel area is completely healed she does have some callus buildup I am going to trim this down a little bit I do not want to build up to the point of cracking she is doing well with the urea cream. With regard to the left heel she is doing well with casting I am going to perform some debridement here to clear away before reapplying the cast and I think she is really doing quite well though. 08-07-2023 upon evaluation patient's wound actually showed signs of excellent improvement and actually very pleased with where we stand I do believe that she is making good headway towards complete closure which is great news. 08-14-2023 upon evaluation today patient appears to be doing well currently in regard to her wound. She actually is going require a little bit of debridement but fortunately nothing too significant today actually feel like she is doing much better. This is mainly to remove callus on the little slough and biofilm down to good subcutaneous tissue. In general I think that she is making excellent headway  here. Electronic Signature(s) Signed: 08/14/2023 1:31:11 PM By: [name] PA-C Entered By: [name] on 08/14/2023 10:31:11 -------------------------------------------------------------------------------- Physical Exam Details Patient Name: Date of Service: KISHARA, LOVELAND 08/14/2023 12:45 PM Medical Record Number: [id number] Patient Account Number: [id number] Date of Birth/Sex: Treating RN: [birth year] (47 y.o. Ginette Pitman Primary Care Provider: [name] Other Clinician: [name] Referring Provider: Treating Provider/Extender: [name] Weeks in Treatment: 81 Constitutional Well-nourished and  03/16/2022 03/16/2022 W54.0XXA Bitten by dog, initial encounter 05/17/2023 05/17/2023 S81.801A Unspecified open wound, right lower leg, initial encounter 05/17/2023 05/17/2023 SHANIN, PARRILLI A ([id number]) [phone].pdf Page 12 of 21 E11.622 Type 2 diabetes mellitus with other skin ulcer 05/17/2023 05/17/2023 Electronic Signature(s) Signed: 08/14/2023 1:17:41 PM By: [name] PA-C Entered By: [name] on 08/14/2023 10:17:41 -------------------------------------------------------------------------------- Progress Note Details Patient Name: Date of Service: JOHNAE, IANNONE 08/14/2023 12:45 PM Medical Record Number: [id number] Patient Account Number: [id number] Date of Birth/Sex: Treating RN: February 04, 1976 (47 y.o. Ginette Pitman Primary Care Provider: [name] Other  Clinician: [name] Referring Provider: Treating Provider/Extender: [name] in Treatment: 38 Subjective Chief Complaint Information obtained from Patient Left foot ulcer History of Present Illness (HPI) 01/18/18-She is here for initial evaluation of the left great toe ulcer. She is a poor historian in regards to timeframe in detail. She states approximately 4 weeks ago she lacerated her toe on something in the house. She followed up with her primary care who placed her on Bactrim and ultimately a second dose of Bactrim prior to coming to wound clinic. She states she has been treating the toe with peroxide, Betadine and a Band-Aid. She did not check her blood sugar this morning but checked it yesterday morning it was 327; she is unaware of a recent A1c and there are no current records. She saw Dr. she would've orthopedics last week for an old injury to the left ankle, she states he did not see her toe, nor did she bring it to his attention. She smokes approximately 1 pack cigarettes a day. Her social situation is concerning, she arrives this morning with her mother who appears extremely intoxicated/under the influence; her mother was asked to leave the room and be monitored by the patient's grandmother. The patient's aunt then accompanied the patient and the room throughout the rest of the appointment. We had a lengthy discussion regarding the deleterious effects of uncontrolled hyperglycemia and smoking as it relates to wound healing and overall health. She was strongly encouraged to decrease her smoking and get her diabetes under better control. She states she is currently on a diet and has cut down her [facility] consumption. The left toe is erythematous, macerated and slightly edematous with malodor present. The edema in her left foot is below her baseline, there is no erythema streaking. We will treat her with Santyl, doxycycline; we have ordered and xray,  culture and provided a Peg assist surgical shoe and cultured the wound. 01/25/18-She is here in follow-up evaluation for a left great toe ulcer and presents with an abscess to her suprapubic area. She states her blood sugars remain elevated, feeling "sick" and if levels are below 250, but she is trying. She has made no attempt to decrease her smoking stating that we "can't take away her food in her cigarettes". She has been compliant with offloading using the PEG assist you. She is using Santyl daily. the culture obtained last week grew staph aureus and Enterococcus faecalis; continues on the doxycycline and Augmentin was added on Monday. The suprapubic area has erythema, no femoral variation, purple discoloration, minimal induration, was accessed with a cotton tip applicator with sanguinopurulent drainage, this was cultured, I suspect the current antibiotic treatment will cover and we will not add anything to her current treatment plan. She was advised to go to urgent care or ER with any change in redness, induration or fever. 02/01/18-She is here in follow-up evaluation for left great  Minette A ([id number]) [id number] Page 20 of 21 Post procedure Diagnosis Wound #12R: Same as Pre-Procedure Pre-procedure diagnosis of Wound #12R is a Diabetic Wound/Ulcer of the Lower Extremity located on the Left,Medial,Plantar Foot . There was a T Contact otal Cast Procedure by [name], PA-C. Post procedure Diagnosis Wound #12R: Same as Pre-Procedure Plan Follow-up Appointments: Return Appointment in 1 week. Bathing/ Shower/ Hygiene: May shower; gently cleanse wound with antibacterial soap, rinse and pat dry prior to dressing wounds No tub bath. Off-Loading: T Contact  Cast to Left Lower Extremity otal Open toe surgical shoe - right foot WOUND #12R: - Foot Wound Laterality: Plantar, Left, Medial Cleanser: Vashe 5.8 (oz) Discharge Instructions: Use vashe 5.8 (oz) as directed Topical: Gentamicin Discharge Instructions: Apply as directed by provider. Prim Dressing: Hydrofera Blue Ready Transfer Foam, 2.5x2.5 (in/in) ary Discharge Instructions: Apply Hydrofera Blue Ready to wound bed as directed Secondary Dressing: ABD Pad 5x9 (in/in) Discharge Instructions: Cover with ABD pad 1. I am going to recommend that the patient should continue with the total contact casting along with the [facility] Blue and mupirocin. 2. I did go ahead and reapply the cast myself today as well and patient tolerated that without complication. We will see patient back for reevaluation in 1 week here in the clinic. If anything worsens or changes patient will contact our office for additional recommendations. Electronic Signature(s) Signed: 08/14/2023 1:31:42 PM By: [name] PA-C Entered By: [name] on 08/14/2023 10:31:42 -------------------------------------------------------------------------------- Total Contact Cast Details Patient Name: Date of Service: ANALIESE, DEMARTINI 08/14/2023 12:45 PM Medical Record Number: [id number] Patient Account Number: [id number] Date of Birth/Sex: Treating RN: 01/22/1976 (48 y.o. Ginette Pitman Primary Care Provider: [name] Other Clinician: [name] Referring Provider: Treating Provider/Extender: [name] Weeks in Treatment: 10 T Contact Cast Applied for Wound Assessment: otal Wound #12R Left,Medial,Plantar Foot Performed By: Physician [name], PA-C Post Procedure Diagnosis Same as Pre-procedure Electronic Signature(s) Signed: 08/14/2023 4:50:43 PM By: [name] PA-C Signed: 08/15/2023 4:53:28 PM By: [name] Entered By: [name] on 08/14/2023 10:24:25 Earmon Phoenix A ([id number])  [id number] Page 21 of 21 -------------------------------------------------------------------------------- SuperBill Details Patient Name: Date of Service: SINIAH, DAMICO 08/14/2023 Medical Record Number: [id number] Patient Account Number: [id number] Date of Birth/Sex: Treating RN: 03-Feb-1976 (47 y.o. 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 Z79.4 Long term (current) use of insulin N18.6 End stage renal disease Z99.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 L84 Corns and callosities Facility Procedures : CPT4 Code: [id number] Description: 11042 - DEB SUBQ TISSUE 20 SQ CM/< ICD-10 Diagnosis Description 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 Antibiotic ointment to the right anterior leg wound. She completed her oral antibiotics. She has no issues or complaints today. Wounds continue to improve albeit slowly. 8/14; 8/14; patient has 3 wounds 1 on the left plantar heel 1 on the right plantar heel and a small wound on the right anterior lower leg. We have been using Hydrofera Blue in the left heel Aquacel on the right heel. The left heel has been in a total contact cast 8/21; patient presents for follow-up. We have been using Hydrofera Blue to the left foot wound under the total contact cast. T the right heel patient has been o using Aquacel Ag. T the anterior leg wound antibiotic ointment. All wounds have healed. o 8/28; patient presents for follow-up. She has been using her surgical shoe with offloading felt pads. Her wounds have remained closed. 9/11; patient presents for final follow-up to assure that her wounds have remained closed. Unfortunately the left foot wound has opened again. But she has been wearing her Surgical shoe for offloading with felt pad. 07-14-2023 upon  evaluation today patient appears to be doing well currently in regard to her wound. She has been tolerating the dressing changes without complication with total contact casting. I am going to be helping take care of her at this point as she is transferring from Dr. Mikey Bussing to me since Dr. Mikey Bussing is on maternity leave. 07-24-2023 upon evaluation today patient appears to be doing decently well currently in regard to her wound. She is actually showing signs of improvement in regard to the left foot and I feel like this is much better than last week no debridement even necessary today. We may have to perform some slight debridement come next week we will see how things go. Monitor for any signs of worsening overall and obviously if anything changes she should be letting us know. 08-01-2023 upon evaluation today patient appears to be doing excellent in regard to her feet. The right foot/heel area is completely healed she does have some callus buildup I am going to trim this down a little bit I do not want to build up to the point of cracking she is doing well with the urea cream. With regard to the left heel she is doing well with casting I am going to perform some debridement here to clear away before reapplying the cast and I think she is really doing quite well though. 08-07-2023 upon evaluation patient's wound actually showed signs of excellent improvement and actually very pleased with where we stand I do believe that she is making good headway towards complete closure which is great news. 08-14-2023 upon evaluation today patient appears to be doing well currently in regard to her wound. She actually is going require a little bit of debridement but fortunately nothing too significant today actually feel like she is doing much better. This is mainly to remove callus on the little slough and biofilm down to good subcutaneous tissue. In general I think that she is making excellent headway  here. Electronic Signature(s) Signed: 08/14/2023 1:31:11 PM By: [name] PA-C Entered By: [name] on 08/14/2023 10:31:11 -------------------------------------------------------------------------------- Physical Exam Details Patient Name: Date of Service: KISHARA, LOVELAND 08/14/2023 12:45 PM Medical Record Number: [id number] Patient Account Number: [id number] Date of Birth/Sex: Treating RN: [birth year] (47 y.o. Ginette Pitman Primary Care Provider: [name] Other Clinician: [name] Referring Provider: Treating Provider/Extender: [name] Weeks in Treatment: 81 Constitutional Well-nourished and

## 2023-08-16 NOTE — Progress Notes (Signed)
Use vashe 5.8 (oz) as directed [name] ([id number]) [phone].pdf  Page 8 of 8 Heather-Wound Care Topical Gentamicin Discharge Instruction: Apply as directed by Millissa Deese. Primary Dressing Hydrofera Blue Ready Transfer Foam, 2.5x2.5 (in/in) Discharge Instruction: Apply Hydrofera Blue Ready to wound bed as directed Secondary Dressing ABD Pad 5x9 (in/in) Discharge Instruction: Cover with ABD pad Secured With Compression Wrap Compression Stockings Add-Ons Electronic Signature(s) Signed: 08/14/2023 5:06:23 PM By: Midge Aver MSN RN CNS WTA Signed: 08/15/2023 4:53:28 PM By: [name] Entered By: [name] on 08/14/2023 13:13:12 -------------------------------------------------------------------------------- Vitals Details Patient Name: Date of Service: [name] 08/14/2023 12:45 PM Medical Record Number: [id number] Patient Account Number: [id number] Date of Birth/Sex: Treating RN: 06-03-1976 (47 y.o. [name] Primary Care [name]: [name] Other Clinician: [name] Referring Jalecia Leon: Treating Naziah Portee/Extender: [name] in Treatment: 23 Vital Signs Time Taken: 13:00 Temperature (F): 99.0 Height (in): 69 Pulse (bpm): 97 Weight (lbs): 178 Respiratory Rate (breaths/min): 16 Body Mass Index (BMI): 26.3 Blood Pressure (mmHg): 101/62 Reference Range: 80 - 120 mg / dl Electronic Signature(s) Signed: 08/15/2023 4:53:28 PM By: [name] Entered By: [name] on 08/14/2023 13:02:37  [name] ([id number]) [id number] Page 1 of 8 Visit Report for 08/14/2023 Arrival Information Details Patient Name: Date of Service: [name] 08/14/2023 12:45 PM Medical Record Number: [id number] Patient Account Number: [id number] Date of Birth/Sex: Treating RN: 1976-08-25 (47 y.o. [name] Primary Care Burke Terry: [name] Other Clinician: [name] Referring Ersel Wadleigh: Treating Shira Bobst/Extender: [name] in Treatment: 11 Visit Information History Since Last Visit All ordered tests and consults were completed: No Patient Arrived: Wheel Chair Added or deleted any medications: No Arrival Time: 12:57 Any new allergies or adverse reactions: No Transfer Assistance: EasyPivot Patient Lift Had [name] fall or experienced change in No Patient Identification Verified: Yes activities of daily living that may affect Secondary Verification Process Completed: Yes risk of falls: Patient Requires Transmission-Based Precautions: No Signs or symptoms of abuse/neglect since last visito No Patient Has Alerts: No Hospitalized since last visit: No Implantable device outside of the clinic excluding No cellular tissue based products placed in the center since last visit: Has Dressing in Place as Prescribed: Yes Has Footwear/Offloading in Place as Prescribed: Yes Left: T Contact Cast otal Pain Present Now: No Electronic Signature(s) Signed: 08/15/2023 4:53:28 PM By: [name] Entered By: [name] on 08/14/2023 12:57:52 -------------------------------------------------------------------------------- Clinic Level of Care Assessment Details Patient Name: Date of Service: [name] 08/14/2023 12:45 PM Medical Record Number: [id number] Patient Account Number: [id number] Date of Birth/Sex: Treating RN: [birth year] (47 y.o. [name] Primary Care Elio Haden: [name] Other Clinician: [name] Referring Deidre Carino: Treating Jazzlynn Rawe/Extender: [name] in Treatment: 30 Clinic Level of Care Assessment Items TOOL 1 Quantity Score []  - 0 Use when EandM and Procedure is performed on INITIAL visit ASSESSMENTS - Nursing Assessment / Reassessment []  - 0 General Physical Exam (combine w/ comprehensive assessment (listed just below) when performed on new pt. evalsNAIRA, TEJADA [name] ([id number]) [id number] Page 2 of 8 []  - 0 Comprehensive Assessment (HX, ROS, Risk Assessments, Wounds Hx, etc.) ASSESSMENTS - Wound and Skin Assessment / Reassessment []  - 0 Dermatologic / Skin Assessment (not related to wound area) ASSESSMENTS - Ostomy and/or Continence Assessment and Care []  - 0 Incontinence Assessment and Management []  - 0 Ostomy Care Assessment and Management (repouching, etc.) PROCESS - Coordination of Care []  - 0 Simple Patient / Family Education for ongoing care []  - 0 Complex (extensive) Patient / Family Education for ongoing care []  - 0 Staff obtains Chiropractor, Records, T Results / Process Orders est []  - 0 Staff telephones HHA, Nursing Homes / Clarify orders / etc []  - 0 Routine Transfer to another Facility (non-emergent condition) []  - 0 Routine Hospital Admission (non-emergent condition) []  - 0 New Admissions / Manufacturing engineer / Ordering NPWT Apligraf, etc. , []  - 0 Emergency Hospital Admission (emergent condition) PROCESS - Special Needs []  - 0 Pediatric / Minor Patient Management []  - 0 Isolation Patient Management []  - 0 Hearing / Language / Visual special needs []  - 0 Assessment of Community assistance (transportation, D/C planning, etc.) []  - 0 Additional assistance / Altered mentation []  - 0 Support Surface(s) Assessment (bed, cushion, seat, etc.) INTERVENTIONS - Miscellaneous []  - 0 External ear exam []  - 0 Patient Transfer (multiple staff / Nurse, adult / Similar devices) []  -  0 Simple Staple / Suture removal (25 or less) []  - 0 Complex Staple / Suture removal (26 or more) []  - 0 Hypo/Hyperglycemic Management (do not check if billed separately) []  - 0 Ankle /  Use vashe 5.8 (oz) as directed [name] ([id number]) [phone].pdf  Page 8 of 8 Heather-Wound Care Topical Gentamicin Discharge Instruction: Apply as directed by Millissa Deese. Primary Dressing Hydrofera Blue Ready Transfer Foam, 2.5x2.5 (in/in) Discharge Instruction: Apply Hydrofera Blue Ready to wound bed as directed Secondary Dressing ABD Pad 5x9 (in/in) Discharge Instruction: Cover with ABD pad Secured With Compression Wrap Compression Stockings Add-Ons Electronic Signature(s) Signed: 08/14/2023 5:06:23 PM By: Midge Aver MSN RN CNS WTA Signed: 08/15/2023 4:53:28 PM By: [name] Entered By: [name] on 08/14/2023 13:13:12 -------------------------------------------------------------------------------- Vitals Details Patient Name: Date of Service: [name] 08/14/2023 12:45 PM Medical Record Number: [id number] Patient Account Number: [id number] Date of Birth/Sex: Treating RN: 06-03-1976 (47 y.o. [name] Primary Care [name]: [name] Other Clinician: [name] Referring Jalecia Leon: Treating Naziah Portee/Extender: [name] in Treatment: 23 Vital Signs Time Taken: 13:00 Temperature (F): 99.0 Height (in): 69 Pulse (bpm): 97 Weight (lbs): 178 Respiratory Rate (breaths/min): 16 Body Mass Index (BMI): 26.3 Blood Pressure (mmHg): 101/62 Reference Range: 80 - 120 mg / dl Electronic Signature(s) Signed: 08/15/2023 4:53:28 PM By: [name] Entered By: [name] on 08/14/2023 13:02:37  Brachial Index (ABI) - do not check if billed separately Has the patient been seen at the hospital within the last three years: Yes Total Score: 0 Level Of Care: ____ Electronic Signature(s) Signed: 08/15/2023 4:53:28 PM By: [name] Entered By: [name] on 08/14/2023 13:25:00 -------------------------------------------------------------------------------- Encounter Discharge Information Details Patient Name: Date of Service: [name] 08/14/2023 12:45 PM Medical Record Number: [id number] Patient Account Number: [id number] Date of Birth/Sex: Treating RN: 04-26-1976 (47 y.o. Briyelle, Delcour, Chey [name] ([id number]) [id number] Page 3 of 8 Primary Care Jordell Outten: [name] Other Clinician: [name] Referring Gianelle Mccaul: Treating Bryona Foxworthy/Extender: [name] in Treatment: 44 Encounter Discharge Information Items Post Procedure Vitals Discharge Condition: Stable Temperature (F): 99.0 Ambulatory Status: Wheelchair Pulse (bpm): 97 Discharge Destination: Home Respiratory Rate (breaths/min): 16 Transportation: Private Auto Blood Pressure (mmHg): 101/62 Accompanied By: mother Schedule Follow-up Appointment: Yes Clinical Summary of Care: Electronic Signature(s) Signed: 08/15/2023 4:53:28 PM By: [name] Entered By: [name] on 08/14/2023 13:54:27 -------------------------------------------------------------------------------- Lower Extremity Assessment Details Patient Name: Date of Service: ABRINA, GOOTEE 08/14/2023 12:45 PM Medical Record Number: [id number] Patient Account Number: [id number] Date of Birth/Sex: Treating RN: Jan 24, 1976 (47 y.o. [name] Primary Care Seon Gaertner: [name] Other Clinician: [name] Referring Yalanda Soderman: Treating Zephaniah Enyeart/Extender: [name] Weeks in Treatment: 83 Electronic Signature(s) Signed: 08/14/2023 5:06:23 PM By: Midge Aver MSN RN CNS WTA Signed: 08/15/2023 4:53:28 PM By: [name] Entered By: [name] on 08/14/2023 13:13:45 -------------------------------------------------------------------------------- Multi Wound Chart Details Patient Name: Date of Service: [name] 08/14/2023 12:45 PM Medical Record Number: [id number] Patient Account Number: [id number] Date of Birth/Sex: Treating RN: 03/07/1976 (47 y.o. 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 Primary Etiology: Extremity Chronic sinus problems/congestion, N/[name] N/[name] Comorbid History: Middle ear problems, Anemia, Chronic Obstructive Pulmonary Disease (COPD), Congestive Heart Failure, Type II Diabetes, End Stage Renal Disease, History of pressure wounds, Neuropathy 03/16/2020 N/[name] N/[name] Date Acquired: 7 N/[name] N/[name] Weeks of Treatment: Open N/[name] N/[name] Wound Status: Yes N/[name] N/[name] Wound Recurrence: 0.3x0.3x0.1 N/[name] N/[name] Measurements L x W x D (cm) 0.071 N/[name] N/[name] [name] (cm) : rea 0.007 N/[name] N/[name] Volume (cm) : 98.50% N/[name] N/[name] % Reduction in [name] rea: 99.30% N/[name] N/[name] % Reduction in Volume: Grade 3 N/[name] N/[name] Classification: Medium N/[name] N/[name] Exudate [name] mount: Serosanguineous N/[name] N/[name] Exudate  Type: red, brown N/[name] N/[name] Exudate Color: Flat and Intact N/[name] N/[name] Wound Margin: None Present (0%) N/[name] N/[name] Granulation [name] mount: None Present (0%) N/[name] N/[name] Necrotic [name] mount: Fat Layer (Subcutaneous Tissue): Yes N/[name] N/[name] Exposed Structures: Fascia: No Tendon: No Muscle: No Joint: No Bone: No Large (67-100%) N/[name] N/[name] Epithelialization: Treatment Notes Electronic Signature(s) Signed: 08/15/2023 4:53:28 PM By: [name] Entered By: [name] on 08/14/2023 13:13:59 -------------------------------------------------------------------------------- Multi-Disciplinary Care Plan Details Patient Name: Date of Service: [name] 08/14/2023 12:45 PM Medical Record Number: [id number] Patient Account Number: [id number] Date of Birth/Sex: Treating RN: 13-Jun-1976 (47 y.o. [name] Primary Care Kalif Kattner: [name] Other Clinician: [name] Referring Jedi Catalfamo: Treating Xander Jutras/Extender: Norman Clay,

## 2023-08-17 ENCOUNTER — Encounter: Payer: MEDICAID | Admitting: Physician Assistant

## 2023-08-21 ENCOUNTER — Encounter: Payer: MEDICAID | Admitting: Physician Assistant

## 2023-08-21 DIAGNOSIS — I5022 Chronic systolic (congestive) heart failure: Secondary | ICD-10-CM | POA: Diagnosis not present

## 2023-08-21 NOTE — Progress Notes (Addendum)
will see patient back for reevaluation in 1 week here in the clinic. If anything worsens or changes patient will contact our office for additional recommendations. Electronic Signature(s) Signed: 08/21/2023 10:52:42 AM By: Heather Derry PA-C Entered By: [name] on 08/21/2023 07:52:42 -------------------------------------------------------------------------------- Total Contact Cast Details Patient Name: Date of Service: [name], [name] 08/21/2023 9:00 [name]: [id number] Patient Account Number: [id number] Date of Birth/Sex: Treating RN: 03/25/1976 (47 y.o. [name] Primary Care Provider: Lindwood [name] Other Clinician: Betha [name] Referring Provider: Treating Provider/Extender: [name] Weeks in Treatment: 40 T Contact Cast Applied for Wound Assessment: otal Wound #12R Left,Medial,Plantar Foot [name], [name] ([id number]) [id number] Page 20 of 20 Performed By: Physician Heather Derry, PA-C The following information was scribed by: [name] The information was scribed for: [name] Post Procedure Diagnosis Same as Pre-procedure Electronic Signature(s) Signed: 08/21/2023 2:19:13 PM By: Heather Derry PA-C Signed: 08/21/2023 4:31:16 PM By: [name] Entered By: [name] on 08/21/2023  07:14:10 -------------------------------------------------------------------------------- SuperBill Details Patient Name: Date of Service: [name] 08/21/2023 Medical Record Number: [id number] Patient Account Number: [id number] Date of Birth/Sex: Treating RN: 02/24/1976 (47 y.o. 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 Z79.4 Long term (current) use of insulin N18.6 End stage renal disease Z99.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 L84 Corns and callosities Facility Procedures : CPT4 Code: 41324401 2 Description: 9445 - APPLY TOTAL CONTACT LEG CAST ICD-10 Diagnosis Description L97.528 Non-pressure chronic ulcer of other part of left foot with other specified sever Modifier: ity Quantity: 1 Physician Procedures : CPT4 Code Description Modifier [id number] - WC PHYS APPLY TOTAL CONTACT CAST ICD-10 Diagnosis Description L97.528 Non-pressure chronic ulcer of other part of left foot with other specified severity Quantity: 1 Electronic Signature(s) Signed: 08/21/2023 10:52:56 AM By: Heather Derry PA-C Entered By: [name] on 08/21/2023 07:52:56  feel like this is much better than last week no debridement even necessary today. We may have to perform some slight debridement come next week we will see how things go. Monitor for any signs of worsening overall and obviously if anything changes she should be letting us know. 08-01-2023 upon evaluation today patient appears to be doing excellent in regard to her feet. The right foot/heel area is completely healed she does have some callus buildup I am going to trim this down [name] little bit I do not want to build up to the point of cracking she is doing well with the urea cream. With regard to the left heel she is doing well with casting I am going to perform some debridement here to clear away before reapplying the cast and I think she is really doing quite well though. 08-07-2023 upon evaluation patient's wound actually showed signs of excellent improvement and actually very pleased with where we stand I do believe that she is making good headway towards complete closure which is great news. 08-14-2023 upon evaluation today patient appears to be doing well currently in regard to her wound. She actually is going require [name] little bit of debridement but fortunately nothing too significant today actually feel like she is doing much better. This is mainly to remove callus on the little slough and biofilm down to good subcutaneous tissue. In general I think that she is making excellent headway here. 08-21-2023 upon evaluation today patient appears to be doing well currently in regard to her wound which is actually showing signs of significant improvement. I am extremely pleased with where things stand I do  not see any signs of infection at this time which is great news and overall I do think that we are making headway towards complete closure which is also excellent news. Electronic Signature(s) Signed: 08/21/2023 10:51:48 AM By: Heather Derry PA-C Entered By: [name] on 08/21/2023 07:51:48 -------------------------------------------------------------------------------- Physical Exam Details Patient Name: Date of Service: [name], [name] 08/21/2023 9:00 [name]: [id number] Patient Account Number: [id number] Date of Birth/Sex: Treating RN: July 31, 1976 (47 y.o. [name] Primary Care Provider: Lindwood [name] Other Clinician: Betha [name] Referring Provider: Treating Provider/Extender: [name] in Treatment: [address], [place] [name] ([id number]) [id number] Page 9 of 20 Constitutional Well-nourished and well-hydrated in no acute distress. Respiratory normal breathing without difficulty. Psychiatric this patient is able to make decisions and demonstrates good insight into disease process. Alert and Oriented x 3. pleasant and cooperative. Notes Upon inspection patient's wound did not require any sharp debridement this looks to be doing excellent and I do feel like the total contact cast has done [name] great job up to this point. I would recommend as such that we continue with the total contact casting and she is in agreement with the plan. She pretty much stays off of this but when she does have to get up and move around it does help her. Electronic Signature(s) Signed: 08/21/2023 10:52:12 AM By: Heather Derry PA-C Entered By: [name] on 08/21/2023 07:52:11 -------------------------------------------------------------------------------- Physician Orders Details Patient Name: Date of Service: Heather Jefferson [name]. 08/21/2023 9:00 [name]: [id number] Patient Account Number: [id number] Date of Birth/Sex:  Treating RN: April 26, 1976 (47 y.o. [name] Primary Care Provider: Lindwood [name] Other Clinician: Betha [name] Referring Provider: Treating Provider/Extender: [name] in Treatment: 25 The following information was scribed by: [name] The information was scribed for: [name]  feel like this is much better than last week no debridement even necessary today. We may have to perform some slight debridement come next week we will see how things go. Monitor for any signs of worsening overall and obviously if anything changes she should be letting us know. 08-01-2023 upon evaluation today patient appears to be doing excellent in regard to her feet. The right foot/heel area is completely healed she does have some callus buildup I am going to trim this down [name] little bit I do not want to build up to the point of cracking she is doing well with the urea cream. With regard to the left heel she is doing well with casting I am going to perform some debridement here to clear away before reapplying the cast and I think she is really doing quite well though. 08-07-2023 upon evaluation patient's wound actually showed signs of excellent improvement and actually very pleased with where we stand I do believe that she is making good headway towards complete closure which is great news. 08-14-2023 upon evaluation today patient appears to be doing well currently in regard to her wound. She actually is going require [name] little bit of debridement but fortunately nothing too significant today actually feel like she is doing much better. This is mainly to remove callus on the little slough and biofilm down to good subcutaneous tissue. In general I think that she is making excellent headway here. 08-21-2023 upon evaluation today patient appears to be doing well currently in regard to her wound which is actually showing signs of significant improvement. I am extremely pleased with where things stand I do  not see any signs of infection at this time which is great news and overall I do think that we are making headway towards complete closure which is also excellent news. Electronic Signature(s) Signed: 08/21/2023 10:51:48 AM By: Heather Derry PA-C Entered By: [name] on 08/21/2023 07:51:48 -------------------------------------------------------------------------------- Physical Exam Details Patient Name: Date of Service: [name], [name] 08/21/2023 9:00 [name]: [id number] Patient Account Number: [id number] Date of Birth/Sex: Treating RN: July 31, 1976 (47 y.o. [name] Primary Care Provider: Lindwood [name] Other Clinician: Betha [name] Referring Provider: Treating Provider/Extender: [name] in Treatment: [address], [place] [name] ([id number]) [id number] Page 9 of 20 Constitutional Well-nourished and well-hydrated in no acute distress. Respiratory normal breathing without difficulty. Psychiatric this patient is able to make decisions and demonstrates good insight into disease process. Alert and Oriented x 3. pleasant and cooperative. Notes Upon inspection patient's wound did not require any sharp debridement this looks to be doing excellent and I do feel like the total contact cast has done [name] great job up to this point. I would recommend as such that we continue with the total contact casting and she is in agreement with the plan. She pretty much stays off of this but when she does have to get up and move around it does help her. Electronic Signature(s) Signed: 08/21/2023 10:52:12 AM By: Heather Derry PA-C Entered By: [name] on 08/21/2023 07:52:11 -------------------------------------------------------------------------------- Physician Orders Details Patient Name: Date of Service: Heather Jefferson [name]. 08/21/2023 9:00 [name]: [id number] Patient Account Number: [id number] Date of Birth/Sex:  Treating RN: April 26, 1976 (47 y.o. 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 Bathing/ Shower/ Hygiene May shower; gently cleanse wound with antibacterial soap, rinse and pat dry prior to dressing wounds No tub bath. Off-Loading Total Contact Cast to Left Lower Extremity Open toe surgical shoe - right foot Wound Treatment Wound #12R - Foot Wound Laterality: Plantar, Left, Medial Cleanser: Vashe 5.8 (oz) Discharge Instructions: Use vashe 5.8 (oz) as directed Topical: Gentamicin Discharge Instructions: Apply as directed by provider. [name], [name] ([id number]) [id number] Page 10 of 20 Prim Dressing: Hydrofera Blue Ready Transfer Foam, 2.5x2.5 (in/in) ary Discharge Instructions: Apply Hydrofera Blue Ready to wound bed as directed Secondary Dressing: ABD Pad 5x9 (in/in) Discharge Instructions: Cover with ABD pad Electronic Signature(s) Signed: 08/21/2023 2:19:13 PM By: Heather Derry PA-C Signed: 08/21/2023 4:31:16 PM By: [name] Entered By: [name] on 08/21/2023 07:14:37 -------------------------------------------------------------------------------- Problem List Details Patient Name: Date of Service: Heather Jefferson [name]. 08/21/2023 9:00 [name]  M Medical Record Number: [id number] Patient Account Number: [id number] Date of Birth/Sex: Treating RN: Jul 02, 1976 (47 y.o. 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/16/2022 No Yes severity E11.42 Type 2 diabetes mellitus with diabetic polyneuropathy 03/16/2022 No Yes Z79.4 Long term (current) use of insulin 02/22/2023 No Yes N18.6 End stage renal disease 07/14/2023 No Yes Z99.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 07/14/2023 No Yes L84 Corns and callosities 08/01/2023 No Yes Inactive Problems Resolved Problems [name], [name] ([id number]) [phone].pdf Page 11 of 20 ICD-10 Code Description Active Date Resolved Date L97.512 Non-pressure chronic ulcer of other part of right foot with fat layer exposed 03/16/2022 03/16/2022 L97.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 of skin 03/16/2022 03/16/2022 W54.0XXA Bitten by dog, initial encounter 05/17/2023 05/17/2023 S81.801A Unspecified open wound, right lower leg, initial encounter 05/17/2023 05/17/2023 Q03.474 Type 2 diabetes mellitus with other skin ulcer 05/17/2023 05/17/2023 Electronic Signature(s) Signed: 08/21/2023 9:08:53 AM By: Heather Derry PA-C Entered By: [name] on 08/21/2023 06:08:53 -------------------------------------------------------------------------------- Progress Note Details Patient Name: Date of Service: Heather Jefferson [name]. 08/21/2023 9:00 [name]: [id number] Patient Account Number: [id number] Date of Birth/Sex: Treating RN: 12-11-1975 (47 y.o. [name] Primary Care Provider:  Lindwood [name] Other Clinician: Betha [name] Referring Provider: Treating Provider/Extender: [name] in Treatment: 71 Subjective Chief Complaint Information obtained from Patient Left foot ulcer History of Present Illness (HPI) 01/18/18-She is here for initial evaluation of the left great toe ulcer. She is [name] poor historian in regards to timeframe in detail. She states approximately 4 weeks ago she lacerated her toe on something in the house. She followed up with her primary care who placed her on Bactrim and ultimately [name] second dose of Bactrim prior to coming to wound clinic. She states she has been treating the toe with peroxide, Betadine and [name] Band-Aid. She did not check her  [name], [name] ([id number]) [id number] Page 1 of 20 Visit Report for 08/21/2023 Chief Complaint Document Details Patient Name: Date of Service: [name], [name] 08/21/2023 9:00 [name]: [id number] Patient Account Number: [id number] Date of Birth/Sex: Treating RN: 27-Jan-1976 (47 y.o. [name] Primary Care Provider: Lindwood [name] Other Clinician: Betha [name] Referring Provider: Treating Provider/Extender: [name] Weeks in Treatment: 64 Information Obtained from: Patient Chief Complaint Left foot ulcer Electronic Signature(s) Signed: 08/21/2023 9:08:59 AM By: Heather Derry PA-C Entered By: [name] on 08/21/2023 06:08:59 -------------------------------------------------------------------------------- HPI Details Patient Name: Date of Service: Heather Jefferson [name]. 08/21/2023 9:00 [name]: [id number] Patient Account Number: [id number] Date of Birth/Sex: Treating RN: 03-02-1976 (47 y.o. [name] Primary Care Provider: Lindwood [name] Other Clinician: Betha [name] Referring Provider: Treating Provider/Extender: [name] in Treatment: 78 History of Present Illness HPI Description: 01/18/18-She is here for initial evaluation of the left great toe ulcer. She is [name] poor historian in regards to timeframe in detail. She states approximately 4 weeks ago she lacerated her toe on something in the house. She followed up with her primary care who placed her on Bactrim and ultimately [name] second dose of Bactrim prior to coming to wound clinic. She states she has been treating the toe with peroxide, Betadine and [name] Band-Aid. She did not check her blood sugar this morning but checked it yesterday morning it was 327; she is unaware of [name] recent A1c and there are no current records. She saw Dr. she would've orthopedics last week for an old injury to the left ankle, she states he did  not see her toe, nor did she bring it to his attention. She smokes approximately 1 pack cigarettes [name] day. Her social situation is concerning, she arrives this morning with her mother who appears extremely intoxicated/under the influence; her mother was asked to leave the room and be monitored by the patient's grandmother. The patient's aunt then accompanied the patient and the room throughout the rest of the appointment. We had [name] lengthy discussion regarding the deleterious effects of uncontrolled hyperglycemia and smoking as it relates to wound healing and overall health. She was strongly encouraged to decrease her smoking and get her diabetes under better control. She states she is currently on [name] diet and has cut down her [facility] consumption. The left toe is erythematous, macerated and slightly edematous with malodor present. The edema in her left foot is below her baseline, there is no erythema streaking. We will treat her with Santyl, doxycycline; we have ordered and xray, culture and provided [name] Peg assist surgical shoe and cultured the wound. 01/25/18-She is here in follow-up evaluation for [name] left great toe ulcer and presents with an abscess to her suprapubic area. She states her blood sugars remain elevated, feeling "sick" and if levels are below 250, but she is trying. She has made no attempt to decrease her smoking stating that we "can't take away her food in her cigarettes". She has been compliant with offloading using the PEG assist you. She is using Santyl daily. the culture obtained last week grew staph aureus and Enterococcus faecalis; continues on the doxycycline and Augmentin was added on Monday. The suprapubic area has erythema, no femoral variation, purple discoloration, minimal induration, was accessed with [name] cotton tip applicator with sanguinopurulent drainage, this was cultured, I suspect the OPALENE, NEEB [name] ([id number]) [phone].pdf Page 2 of  20 current antibiotic treatment will cover  will see patient back for reevaluation in 1 week here in the clinic. If anything worsens or changes patient will contact our office for additional recommendations. Electronic Signature(s) Signed: 08/21/2023 10:52:42 AM By: Heather Derry PA-C Entered By: [name] on 08/21/2023 07:52:42 -------------------------------------------------------------------------------- Total Contact Cast Details Patient Name: Date of Service: [name], [name] 08/21/2023 9:00 [name]: [id number] Patient Account Number: [id number] Date of Birth/Sex: Treating RN: 03/25/1976 (47 y.o. [name] Primary Care Provider: Lindwood [name] Other Clinician: Betha [name] Referring Provider: Treating Provider/Extender: [name] Weeks in Treatment: 40 T Contact Cast Applied for Wound Assessment: otal Wound #12R Left,Medial,Plantar Foot [name], [name] ([id number]) [id number] Page 20 of 20 Performed By: Physician Heather Derry, PA-C The following information was scribed by: [name] The information was scribed for: [name] Post Procedure Diagnosis Same as Pre-procedure Electronic Signature(s) Signed: 08/21/2023 2:19:13 PM By: Heather Derry PA-C Signed: 08/21/2023 4:31:16 PM By: [name] Entered By: [name] on 08/21/2023  07:14:10 -------------------------------------------------------------------------------- SuperBill Details Patient Name: Date of Service: [name] 08/21/2023 Medical Record Number: [id number] Patient Account Number: [id number] Date of Birth/Sex: Treating RN: 02/24/1976 (47 y.o. 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 Z79.4 Long term (current) use of insulin N18.6 End stage renal disease Z99.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 L84 Corns and callosities Facility Procedures : CPT4 Code: 41324401 2 Description: 9445 - APPLY TOTAL CONTACT LEG CAST ICD-10 Diagnosis Description L97.528 Non-pressure chronic ulcer of other part of left foot with other specified sever Modifier: ity Quantity: 1 Physician Procedures : CPT4 Code Description Modifier [id number] - WC PHYS APPLY TOTAL CONTACT CAST ICD-10 Diagnosis Description L97.528 Non-pressure chronic ulcer of other part of left foot with other specified severity Quantity: 1 Electronic Signature(s) Signed: 08/21/2023 10:52:56 AM By: Heather Derry PA-C Entered By: [name] on 08/21/2023 07:52:56  [name], [name] ([id number]) [id number] Page 1 of 20 Visit Report for 08/21/2023 Chief Complaint Document Details Patient Name: Date of Service: [name], [name] 08/21/2023 9:00 [name]: [id number] Patient Account Number: [id number] Date of Birth/Sex: Treating RN: 27-Jan-1976 (47 y.o. [name] Primary Care Provider: Lindwood [name] Other Clinician: Betha [name] Referring Provider: Treating Provider/Extender: [name] Weeks in Treatment: 64 Information Obtained from: Patient Chief Complaint Left foot ulcer Electronic Signature(s) Signed: 08/21/2023 9:08:59 AM By: Heather Derry PA-C Entered By: [name] on 08/21/2023 06:08:59 -------------------------------------------------------------------------------- HPI Details Patient Name: Date of Service: Heather Jefferson [name]. 08/21/2023 9:00 [name]: [id number] Patient Account Number: [id number] Date of Birth/Sex: Treating RN: 03-02-1976 (47 y.o. [name] Primary Care Provider: Lindwood [name] Other Clinician: Betha [name] Referring Provider: Treating Provider/Extender: [name] in Treatment: 78 History of Present Illness HPI Description: 01/18/18-She is here for initial evaluation of the left great toe ulcer. She is [name] poor historian in regards to timeframe in detail. She states approximately 4 weeks ago she lacerated her toe on something in the house. She followed up with her primary care who placed her on Bactrim and ultimately [name] second dose of Bactrim prior to coming to wound clinic. She states she has been treating the toe with peroxide, Betadine and [name] Band-Aid. She did not check her blood sugar this morning but checked it yesterday morning it was 327; she is unaware of [name] recent A1c and there are no current records. She saw Dr. she would've orthopedics last week for an old injury to the left ankle, she states he did  not see her toe, nor did she bring it to his attention. She smokes approximately 1 pack cigarettes [name] day. Her social situation is concerning, she arrives this morning with her mother who appears extremely intoxicated/under the influence; her mother was asked to leave the room and be monitored by the patient's grandmother. The patient's aunt then accompanied the patient and the room throughout the rest of the appointment. We had [name] lengthy discussion regarding the deleterious effects of uncontrolled hyperglycemia and smoking as it relates to wound healing and overall health. She was strongly encouraged to decrease her smoking and get her diabetes under better control. She states she is currently on [name] diet and has cut down her [facility] consumption. The left toe is erythematous, macerated and slightly edematous with malodor present. The edema in her left foot is below her baseline, there is no erythema streaking. We will treat her with Santyl, doxycycline; we have ordered and xray, culture and provided [name] Peg assist surgical shoe and cultured the wound. 01/25/18-She is here in follow-up evaluation for [name] left great toe ulcer and presents with an abscess to her suprapubic area. She states her blood sugars remain elevated, feeling "sick" and if levels are below 250, but she is trying. She has made no attempt to decrease her smoking stating that we "can't take away her food in her cigarettes". She has been compliant with offloading using the PEG assist you. She is using Santyl daily. the culture obtained last week grew staph aureus and Enterococcus faecalis; continues on the doxycycline and Augmentin was added on Monday. The suprapubic area has erythema, no femoral variation, purple discoloration, minimal induration, was accessed with [name] cotton tip applicator with sanguinopurulent drainage, this was cultured, I suspect the OPALENE, NEEB [name] ([id number]) [id number].pdf Page 2 of  20 current antibiotic treatment will cover  [name], [name] ([id number]) [id number] Page 1 of 20 Visit Report for 08/21/2023 Chief Complaint Document Details Patient Name: Date of Service: [name], [name] 08/21/2023 9:00 [name]: [id number] Patient Account Number: [id number] Date of Birth/Sex: Treating RN: 27-Jan-1976 (47 y.o. [name] Primary Care Provider: Lindwood [name] Other Clinician: Betha [name] Referring Provider: Treating Provider/Extender: [name] Weeks in Treatment: 64 Information Obtained from: Patient Chief Complaint Left foot ulcer Electronic Signature(s) Signed: 08/21/2023 9:08:59 AM By: Heather Derry PA-C Entered By: [name] on 08/21/2023 06:08:59 -------------------------------------------------------------------------------- HPI Details Patient Name: Date of Service: Heather Jefferson [name]. 08/21/2023 9:00 [name]: [id number] Patient Account Number: [id number] Date of Birth/Sex: Treating RN: 03-02-1976 (47 y.o. [name] Primary Care Provider: Lindwood [name] Other Clinician: Betha [name] Referring Provider: Treating Provider/Extender: [name] in Treatment: 78 History of Present Illness HPI Description: 01/18/18-She is here for initial evaluation of the left great toe ulcer. She is [name] poor historian in regards to timeframe in detail. She states approximately 4 weeks ago she lacerated her toe on something in the house. She followed up with her primary care who placed her on Bactrim and ultimately [name] second dose of Bactrim prior to coming to wound clinic. She states she has been treating the toe with peroxide, Betadine and [name] Band-Aid. She did not check her blood sugar this morning but checked it yesterday morning it was 327; she is unaware of [name] recent A1c and there are no current records. She saw Dr. she would've orthopedics last week for an old injury to the left ankle, she states he did  not see her toe, nor did she bring it to his attention. She smokes approximately 1 pack cigarettes [name] day. Her social situation is concerning, she arrives this morning with her mother who appears extremely intoxicated/under the influence; her mother was asked to leave the room and be monitored by the patient's grandmother. The patient's aunt then accompanied the patient and the room throughout the rest of the appointment. We had [name] lengthy discussion regarding the deleterious effects of uncontrolled hyperglycemia and smoking as it relates to wound healing and overall health. She was strongly encouraged to decrease her smoking and get her diabetes under better control. She states she is currently on [name] diet and has cut down her [facility] consumption. The left toe is erythematous, macerated and slightly edematous with malodor present. The edema in her left foot is below her baseline, there is no erythema streaking. We will treat her with Santyl, doxycycline; we have ordered and xray, culture and provided [name] Peg assist surgical shoe and cultured the wound. 01/25/18-She is here in follow-up evaluation for [name] left great toe ulcer and presents with an abscess to her suprapubic area. She states her blood sugars remain elevated, feeling "sick" and if levels are below 250, but she is trying. She has made no attempt to decrease her smoking stating that we "can't take away her food in her cigarettes". She has been compliant with offloading using the PEG assist you. She is using Santyl daily. the culture obtained last week grew staph aureus and Enterococcus faecalis; continues on the doxycycline and Augmentin was added on Monday. The suprapubic area has erythema, no femoral variation, purple discoloration, minimal induration, was accessed with [name] cotton tip applicator with sanguinopurulent drainage, this was cultured, I suspect the OPALENE, NEEB [name] ([id number]) [id number] Page 2 of  20 current antibiotic treatment will cover  feel like this is much better than last week no debridement even necessary today. We may have to perform some slight debridement come next week we will see how things go. Monitor for any signs of worsening overall and obviously if anything changes she should be letting us know. 08-01-2023 upon evaluation today patient appears to be doing excellent in regard to her feet. The right foot/heel area is completely healed she does have some callus buildup I am going to trim this down [name] little bit I do not want to build up to the point of cracking she is doing well with the urea cream. With regard to the left heel she is doing well with casting I am going to perform some debridement here to clear away before reapplying the cast and I think she is really doing quite well though. 08-07-2023 upon evaluation patient's wound actually showed signs of excellent improvement and actually very pleased with where we stand I do believe that she is making good headway towards complete closure which is great news. 08-14-2023 upon evaluation today patient appears to be doing well currently in regard to her wound. She actually is going require [name] little bit of debridement but fortunately nothing too significant today actually feel like she is doing much better. This is mainly to remove callus on the little slough and biofilm down to good subcutaneous tissue. In general I think that she is making excellent headway here. 08-21-2023 upon evaluation today patient appears to be doing well currently in regard to her wound which is actually showing signs of significant improvement. I am extremely pleased with where things stand I do  not see any signs of infection at this time which is great news and overall I do think that we are making headway towards complete closure which is also excellent news. Electronic Signature(s) Signed: 08/21/2023 10:51:48 AM By: Heather Derry PA-C Entered By: [name] on 08/21/2023 07:51:48 -------------------------------------------------------------------------------- Physical Exam Details Patient Name: Date of Service: [name], [name] 08/21/2023 9:00 [name]: [id number] Patient Account Number: [id number] Date of Birth/Sex: Treating RN: July 31, 1976 (47 y.o. [name] Primary Care Provider: Lindwood [name] Other Clinician: Betha [name] Referring Provider: Treating Provider/Extender: [name] in Treatment: [address], Sarina [name] ([id number]) [id number] Page 9 of 20 Constitutional Well-nourished and well-hydrated in no acute distress. Respiratory normal breathing without difficulty. Psychiatric this patient is able to make decisions and demonstrates good insight into disease process. Alert and Oriented x 3. pleasant and cooperative. Notes Upon inspection patient's wound did not require any sharp debridement this looks to be doing excellent and I do feel like the total contact cast has done [name] great job up to this point. I would recommend as such that we continue with the total contact casting and she is in agreement with the plan. She pretty much stays off of this but when she does have to get up and move around it does help her. Electronic Signature(s) Signed: 08/21/2023 10:52:12 AM By: Heather Derry PA-C Entered By: [name] on 08/21/2023 07:52:11 -------------------------------------------------------------------------------- Physician Orders Details Patient Name: Date of Service: Heather Jefferson [name]. 08/21/2023 9:00 [name]: [id number] Patient Account Number: [id number] Date of Birth/Sex:  Treating RN: April 26, 1976 (47 y.o. 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 Bathing/ Shower/ Hygiene May shower; gently cleanse wound with antibacterial soap, rinse and pat dry prior to dressing wounds No tub bath. Off-Loading Total Contact Cast to Left Lower Extremity Open toe surgical shoe - right foot Wound Treatment Wound #12R - Foot Wound Laterality: Plantar, Left, Medial Cleanser: Vashe 5.8 (oz) Discharge Instructions: Use vashe 5.8 (oz) as directed Topical: Gentamicin Discharge Instructions: Apply as directed by provider. [name], [name] ([id number]) [id number] Page 10 of 20 Prim Dressing: Hydrofera Blue Ready Transfer Foam, 2.5x2.5 (in/in) ary Discharge Instructions: Apply Hydrofera Blue Ready to wound bed as directed Secondary Dressing: ABD Pad 5x9 (in/in) Discharge Instructions: Cover with ABD pad Electronic Signature(s) Signed: 08/21/2023 2:19:13 PM By: Heather Derry PA-C Signed: 08/21/2023 4:31:16 PM By: [name] Entered By: [name] on 08/21/2023 07:14:37 -------------------------------------------------------------------------------- Problem List Details Patient Name: Date of Service: Heather Jefferson [name]. 08/21/2023 9:00 [name]  M Medical Record Number: [id number] Patient Account Number: [id number] Date of Birth/Sex: Treating RN: Jul 02, 1976 (47 y.o. 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/16/2022 No Yes severity E11.42 Type 2 diabetes mellitus with diabetic polyneuropathy 03/16/2022 No Yes Z79.4 Long term (current) use of insulin 02/22/2023 No Yes N18.6 End stage renal disease 07/14/2023 No Yes Z99.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 07/14/2023 No Yes L84 Corns and callosities 08/01/2023 No Yes Inactive Problems Resolved Problems [name], [name] ([id number]) [phone].pdf Page 11 of 20 ICD-10 Code Description Active Date Resolved Date L97.512 Non-pressure chronic ulcer of other part of right foot with fat layer exposed 03/16/2022 03/16/2022 L97.811 Non-pressure chronic ulcer of other part of right lower leg limited to breakdown of skin 03/16/2022 03/16/2022 W54.0XXA Bitten by dog, initial encounter 05/17/2023 05/17/2023 S81.801A Unspecified open wound, right lower leg, initial encounter 05/17/2023 05/17/2023 Q03.474 Type 2 diabetes mellitus with other skin ulcer 05/17/2023 05/17/2023 Electronic Signature(s) Signed: 08/21/2023 9:08:53 AM By: Heather Derry PA-C Entered By: [name] on 08/21/2023 06:08:53 -------------------------------------------------------------------------------- Progress Note Details Patient Name: Date of Service: Heather Jefferson [name]. 08/21/2023 9:00 [name]: [id number] Patient Account Number: [id number] Date of Birth/Sex: Treating RN: 12-11-1975 (47 y.o. [name] Primary Care Provider:  Lindwood [name] Other Clinician: Betha [name] Referring Provider: Treating Provider/Extender: [name] in Treatment: 71 Subjective Chief Complaint Information obtained from Patient Left foot ulcer History of Present Illness (HPI) 01/18/18-She is here for initial evaluation of the left great toe ulcer. She is [name] poor historian in regards to timeframe in detail. She states approximately 4 weeks ago she lacerated her toe on something in the house. She followed up with her primary care who placed her on Bactrim and ultimately [name] second dose of Bactrim prior to coming to wound clinic. She states she has been treating the toe with peroxide, Betadine and [name] Band-Aid. 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 Bathing/ Shower/ Hygiene May shower; gently cleanse wound with antibacterial soap, rinse and pat dry prior to dressing wounds No tub bath. Off-Loading Total Contact Cast to Left Lower Extremity Open toe surgical shoe - right foot Wound Treatment Wound #12R - Foot Wound Laterality: Plantar, Left, Medial Cleanser: Vashe 5.8 (oz) Discharge Instructions: Use vashe 5.8 (oz) as directed Topical: Gentamicin Discharge Instructions: Apply as directed by provider. [name], [name] ([id number]) [id number] Page 10 of 20 Prim Dressing: Hydrofera Blue Ready Transfer Foam, 2.5x2.5 (in/in) ary Discharge Instructions: Apply Hydrofera Blue Ready to wound bed as directed Secondary Dressing: ABD Pad 5x9 (in/in) Discharge Instructions: Cover with ABD pad Electronic Signature(s) Signed: 08/21/2023 2:19:13 PM By: Heather Derry PA-C Signed: 08/21/2023 4:31:16 PM By: [name] Entered By: [name] on 08/21/2023 07:14:37 -------------------------------------------------------------------------------- Problem List Details Patient Name: Date of Service: Heather Jefferson [name]. 08/21/2023 9:00 [name]  M Medical Record Number: [id number] Patient Account Number: [id number] Date of Birth/Sex: Treating RN: Jul 02, 1976 (47 y.o. 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/16/2022 No Yes severity E11.42 Type 2 diabetes mellitus with diabetic polyneuropathy 03/16/2022 No Yes Z79.4 Long term (current) use of insulin 02/22/2023 No Yes N18.6 End stage renal disease 07/14/2023 No Yes Z99.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 07/14/2023 No Yes L84 Corns and callosities 08/01/2023 No Yes Inactive Problems Resolved Problems [name], [name] ([id number]) [phone].pdf Page 11 of 20 ICD-10 Code Description Active Date Resolved Date L97.512 Non-pressure chronic ulcer of other part of right foot with fat layer exposed 03/16/2022 03/16/2022 L97.811 Non-pressure chronic ulcer of other part of right lower leg limited to breakdown of skin 03/16/2022 03/16/2022 W54.0XXA Bitten by dog, initial encounter 05/17/2023 05/17/2023 S81.801A Unspecified open wound, right lower leg, initial encounter 05/17/2023 05/17/2023 Q03.474 Type 2 diabetes mellitus with other skin ulcer 05/17/2023 05/17/2023 Electronic Signature(s) Signed: 08/21/2023 9:08:53 AM By: Heather Derry PA-C Entered By: [name] on 08/21/2023 06:08:53 -------------------------------------------------------------------------------- Progress Note Details Patient Name: Date of Service: Heather Jefferson [name]. 08/21/2023 9:00 [name]: [id number] Patient Account Number: [id number] Date of Birth/Sex: Treating RN: 12-11-1975 (47 y.o. [name] Primary Care Provider:  Lindwood [name] Other Clinician: Betha [name] Referring Provider: Treating Provider/Extender: [name] in Treatment: 71 Subjective Chief Complaint Information obtained from Patient Left foot ulcer History of Present Illness (HPI) 01/18/18-She is here for initial evaluation of the left great toe ulcer. She is [name] poor historian in regards to timeframe in detail. She states approximately 4 weeks ago she lacerated her toe on something in the house. She followed up with her primary care who placed her on Bactrim and ultimately [name] second dose of Bactrim prior to coming to wound clinic. She states she has been treating the toe with peroxide, Betadine and [name] Band-Aid. 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 Bathing/ Shower/ Hygiene May shower; gently cleanse wound with antibacterial soap, rinse and pat dry prior to dressing wounds No tub bath. Off-Loading Total Contact Cast to Left Lower Extremity Open toe surgical shoe - right foot Wound Treatment Wound #12R - Foot Wound Laterality: Plantar, Left, Medial Cleanser: Vashe 5.8 (oz) Discharge Instructions: Use vashe 5.8 (oz) as directed Topical: Gentamicin Discharge Instructions: Apply as directed by provider. [name], [name] ([id number]) [id number] Page 10 of 20 Prim Dressing: Hydrofera Blue Ready Transfer Foam, 2.5x2.5 (in/in) ary Discharge Instructions: Apply Hydrofera Blue Ready to wound bed as directed Secondary Dressing: ABD Pad 5x9 (in/in) Discharge Instructions: Cover with ABD pad Electronic Signature(s) Signed: 08/21/2023 2:19:13 PM By: Heather Derry PA-C Signed: 08/21/2023 4:31:16 PM By: [name] Entered By: [name] on 08/21/2023 07:14:37 -------------------------------------------------------------------------------- Problem List Details Patient Name: Date of Service: Heather Jefferson [name]. 08/21/2023 9:00 [name]  M Medical Record Number: [id number] Patient Account Number: [id number] Date of Birth/Sex: Treating RN: Jul 02, 1976 (47 y.o. 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/16/2022 No Yes severity E11.42 Type 2 diabetes mellitus with diabetic polyneuropathy 03/16/2022 No Yes Z79.4 Long term (current) use of insulin 02/22/2023 No Yes N18.6 End stage renal disease 07/14/2023 No Yes Z99.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 07/14/2023 No Yes L84 Corns and callosities 08/01/2023 No Yes Inactive Problems Resolved Problems [name], [name] ([id number]) [phone].pdf Page 11 of 20 ICD-10 Code Description Active Date Resolved Date L97.512 Non-pressure chronic ulcer of other part of right foot with fat layer exposed 03/16/2022 03/16/2022 L97.811 Non-pressure chronic ulcer of other part of right lower leg limited to breakdown of skin 03/16/2022 03/16/2022 W54.0XXA Bitten by dog, initial encounter 05/17/2023 05/17/2023 S81.801A Unspecified open wound, right lower leg, initial encounter 05/17/2023 05/17/2023 Q03.474 Type 2 diabetes mellitus with other skin ulcer 05/17/2023 05/17/2023 Electronic Signature(s) Signed: 08/21/2023 9:08:53 AM By: Heather Derry PA-C Entered By: [name] on 08/21/2023 06:08:53 -------------------------------------------------------------------------------- Progress Note Details Patient Name: Date of Service: Heather Jefferson [name]. 08/21/2023 9:00 [name]: [id number] Patient Account Number: [id number] Date of Birth/Sex: Treating RN: 12-11-1975 (47 y.o. [name] Primary Care Provider:  Lindwood [name] Other Clinician: Betha [name] Referring Provider: Treating Provider/Extender: [name] in Treatment: 71 Subjective Chief Complaint Information obtained from Patient Left foot ulcer History of Present Illness (HPI) 01/18/18-She is here for initial evaluation of the left great toe ulcer. She is [name] poor historian in regards to timeframe in detail. She states approximately 4 weeks ago she lacerated her toe on something in the house. She followed up with her primary care who placed her on Bactrim and ultimately [name] second dose of Bactrim prior to coming to wound clinic. She states she has been treating the toe with peroxide, Betadine and [name] Band-Aid. She did not check her  will see patient back for reevaluation in 1 week here in the clinic. If anything worsens or changes patient will contact our office for additional recommendations. Electronic Signature(s) Signed: 08/21/2023 10:52:42 AM By: Heather Derry PA-C Entered By: [name] on 08/21/2023 07:52:42 -------------------------------------------------------------------------------- Total Contact Cast Details Patient Name: Date of Service: [name], [name] 08/21/2023 9:00 [name]: [id number] Patient Account Number: [id number] Date of Birth/Sex: Treating RN: 03/25/1976 (47 y.o. [name] Primary Care Provider: Lindwood [name] Other Clinician: Betha [name] Referring Provider: Treating Provider/Extender: [name] Weeks in Treatment: 40 T Contact Cast Applied for Wound Assessment: otal Wound #12R Left,Medial,Plantar Foot [name], [name] ([id number]) [id number] Page 20 of 20 Performed By: Physician Heather Derry, PA-C The following information was scribed by: [name] The information was scribed for: [name] Post Procedure Diagnosis Same as Pre-procedure Electronic Signature(s) Signed: 08/21/2023 2:19:13 PM By: Heather Derry PA-C Signed: 08/21/2023 4:31:16 PM By: [name] Entered By: [name] on 08/21/2023  07:14:10 -------------------------------------------------------------------------------- SuperBill Details Patient Name: Date of Service: [name] 08/21/2023 Medical Record Number: [id number] Patient Account Number: [id number] Date of Birth/Sex: Treating RN: 02/24/1976 (47 y.o. 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 Z79.4 Long term (current) use of insulin N18.6 End stage renal disease Z99.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 L84 Corns and callosities Facility Procedures : CPT4 Code: [id number] 2 Description: 9445 - APPLY TOTAL CONTACT LEG CAST ICD-10 Diagnosis Description 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 Modifier: ity Quantity: 1 Physician Procedures : CPT4 Code Description Modifier [id number] - WC PHYS APPLY TOTAL CONTACT CAST ICD-10 Diagnosis Description L97.528 Non-pressure chronic ulcer of other part of left foot with other specified severity Quantity: 1 Electronic Signature(s) Signed: 08/21/2023 10:52:56 AM By: Heather Derry PA-C Entered By: [name] on 08/21/2023 07:52:56  [name], [name] ([id number]) [id number] Page 1 of 20 Visit Report for 08/21/2023 Chief Complaint Document Details Patient Name: Date of Service: [name], [name] 08/21/2023 9:00 [name]: [id number] Patient Account Number: [id number] Date of Birth/Sex: Treating RN: 27-Jan-1976 (47 y.o. [name] Primary Care Provider: Lindwood [name] Other Clinician: Betha [name] Referring Provider: Treating Provider/Extender: [name] Weeks in Treatment: 64 Information Obtained from: Patient Chief Complaint Left foot ulcer Electronic Signature(s) Signed: 08/21/2023 9:08:59 AM By: Heather Derry PA-C Entered By: [name] on 08/21/2023 06:08:59 -------------------------------------------------------------------------------- HPI Details Patient Name: Date of Service: Heather Jefferson [name]. 08/21/2023 9:00 [name]: [id number] Patient Account Number: [id number] Date of Birth/Sex: Treating RN: 03-02-1976 (47 y.o. [name] Primary Care Provider: Lindwood [name] Other Clinician: Betha [name] Referring Provider: Treating Provider/Extender: [name] in Treatment: 78 History of Present Illness HPI Description: 01/18/18-She is here for initial evaluation of the left great toe ulcer. She is [name] poor historian in regards to timeframe in detail. She states approximately 4 weeks ago she lacerated her toe on something in the house. She followed up with her primary care who placed her on Bactrim and ultimately [name] second dose of Bactrim prior to coming to wound clinic. She states she has been treating the toe with peroxide, Betadine and [name] Band-Aid. She did not check her blood sugar this morning but checked it yesterday morning it was 327; she is unaware of [name] recent A1c and there are no current records. She saw Dr. she would've orthopedics last week for an old injury to the left ankle, she states he did  not see her toe, nor did she bring it to his attention. She smokes approximately 1 pack cigarettes [name] day. Her social situation is concerning, she arrives this morning with her mother who appears extremely intoxicated/under the influence; her mother was asked to leave the room and be monitored by the patient's grandmother. The patient's aunt then accompanied the patient and the room throughout the rest of the appointment. We had [name] lengthy discussion regarding the deleterious effects of uncontrolled hyperglycemia and smoking as it relates to wound healing and overall health. She was strongly encouraged to decrease her smoking and get her diabetes under better control. She states she is currently on [name] diet and has cut down her [facility] consumption. The left toe is erythematous, macerated and slightly edematous with malodor present. The edema in her left foot is below her baseline, there is no erythema streaking. We will treat her with Santyl, doxycycline; we have ordered and xray, culture and provided [name] Peg assist surgical shoe and cultured the wound. 01/25/18-She is here in follow-up evaluation for [name] left great toe ulcer and presents with an abscess to her suprapubic area. She states her blood sugars remain elevated, feeling "sick" and if levels are below 250, but she is trying. She has made no attempt to decrease her smoking stating that we "can't take away her food in her cigarettes". She has been compliant with offloading using the PEG assist you. She is using Santyl daily. the culture obtained last week grew staph aureus and Enterococcus faecalis; continues on the doxycycline and Augmentin was added on Monday. 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 Bathing/ Shower/ Hygiene May shower; gently cleanse wound with antibacterial soap, rinse and pat dry prior to dressing wounds No tub bath. Off-Loading Total Contact Cast to Left Lower Extremity Open toe surgical shoe - right foot Wound Treatment Wound #12R - Foot Wound Laterality: Plantar, Left, Medial Cleanser: Vashe 5.8 (oz) Discharge Instructions: Use vashe 5.8 (oz) as directed Topical: Gentamicin Discharge Instructions: Apply as directed by provider. [name], [name] ([id number]) [id number] Page 10 of 20 Prim Dressing: Hydrofera Blue Ready Transfer Foam, 2.5x2.5 (in/in) ary Discharge Instructions: Apply Hydrofera Blue Ready to wound bed as directed Secondary Dressing: ABD Pad 5x9 (in/in) Discharge Instructions: Cover with ABD pad Electronic Signature(s) Signed: 08/21/2023 2:19:13 PM By: Heather Derry PA-C Signed: 08/21/2023 4:31:16 PM By: [name] Entered By: [name] on 08/21/2023 07:14:37 -------------------------------------------------------------------------------- Problem List Details Patient Name: Date of Service: Heather Jefferson [name]. 08/21/2023 9:00 [name]  M Medical Record Number: [id number] Patient Account Number: [id number] Date of Birth/Sex: Treating RN: Jul 02, 1976 (47 y.o. 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/16/2022 No Yes severity E11.42 Type 2 diabetes mellitus with diabetic polyneuropathy 03/16/2022 No Yes Z79.4 Long term (current) use of insulin 02/22/2023 No Yes N18.6 End stage renal disease 07/14/2023 No Yes Z99.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 07/14/2023 No Yes L84 Corns and callosities 08/01/2023 No Yes Inactive Problems Resolved Problems [name], [name] ([id number]) [phone].pdf Page 11 of 20 ICD-10 Code Description Active Date Resolved Date L97.512 Non-pressure chronic ulcer of other part of right foot with fat layer exposed 03/16/2022 03/16/2022 L97.811 Non-pressure chronic ulcer of other part of right lower leg limited to breakdown of skin 03/16/2022 03/16/2022 W54.0XXA Bitten by dog, initial encounter 05/17/2023 05/17/2023 S81.801A Unspecified open wound, right lower leg, initial encounter 05/17/2023 05/17/2023 Q03.474 Type 2 diabetes mellitus with other skin ulcer 05/17/2023 05/17/2023 Electronic Signature(s) Signed: 08/21/2023 9:08:53 AM By: Heather Derry PA-C Entered By: [name] on 08/21/2023 06:08:53 -------------------------------------------------------------------------------- Progress Note Details Patient Name: Date of Service: Heather Jefferson [name]. 08/21/2023 9:00 [name]: [id number] Patient Account Number: [id number] Date of Birth/Sex: Treating RN: 12-11-1975 (47 y.o. [name] Primary Care Provider:  Lindwood [name] Other Clinician: Betha [name] Referring Provider: Treating Provider/Extender: [name] in Treatment: 71 Subjective Chief Complaint Information obtained from Patient Left foot ulcer History of Present Illness (HPI) 01/18/18-She is here for initial evaluation of the left great toe ulcer. She is [name] poor historian in regards to timeframe in detail. She states approximately 4 weeks ago she lacerated her toe on something in the house. She followed up with her primary care who placed her on Bactrim and ultimately [name] second dose of Bactrim prior to coming to wound clinic. She states she has been treating the toe with peroxide, Betadine and [name] Band-Aid. She did not check her  feel like this is much better than last week no debridement even necessary today. We may have to perform some slight debridement come next week we will see how things go. Monitor for any signs of worsening overall and obviously if anything changes she should be letting us know. 08-01-2023 upon evaluation today patient appears to be doing excellent in regard to her feet. The right foot/heel area is completely healed she does have some callus buildup I am going to trim this down [name] little bit I do not want to build up to the point of cracking she is doing well with the urea cream. With regard to the left heel she is doing well with casting I am going to perform some debridement here to clear away before reapplying the cast and I think she is really doing quite well though. 08-07-2023 upon evaluation patient's wound actually showed signs of excellent improvement and actually very pleased with where we stand I do believe that she is making good headway towards complete closure which is great news. 08-14-2023 upon evaluation today patient appears to be doing well currently in regard to her wound. She actually is going require [name] little bit of debridement but fortunately nothing too significant today actually feel like she is doing much better. This is mainly to remove callus on the little slough and biofilm down to good subcutaneous tissue. In general I think that she is making excellent headway here. 08-21-2023 upon evaluation today patient appears to be doing well currently in regard to her wound which is actually showing signs of significant improvement. I am extremely pleased with where things stand I do  not see any signs of infection at this time which is great news and overall I do think that we are making headway towards complete closure which is also excellent news. Electronic Signature(s) Signed: 08/21/2023 10:51:48 AM By: Heather Derry PA-C Entered By: [name] on 08/21/2023 07:51:48 -------------------------------------------------------------------------------- Physical Exam Details Patient Name: Date of Service: [name], [name] 08/21/2023 9:00 [name]: [id number] Patient Account Number: [id number] Date of Birth/Sex: Treating RN: July 31, 1976 (47 y.o. [name] Primary Care Provider: Lindwood [name] Other Clinician: Betha [name] Referring Provider: Treating Provider/Extender: [name] in Treatment: [address], [place]ina [name] ([id number]) [id number] Page 9 of 20 Constitutional Well-nourished and well-hydrated in no acute distress. Respiratory normal breathing without difficulty. Psychiatric this patient is able to make decisions and demonstrates good insight into disease process. Alert and Oriented x 3. pleasant and cooperative. Notes Upon inspection patient's wound did not require any sharp debridement this looks to be doing excellent and I do feel like the total contact cast has done [name] great job up to this point. I would recommend as such that we continue with the total contact casting and she is in agreement with the plan. She pretty much stays off of this but when she does have to get up and move around it does help her. Electronic Signature(s) Signed: 08/21/2023 10:52:12 AM By: Heather Derry PA-C Entered By: [name] on 08/21/2023 07:52:11 -------------------------------------------------------------------------------- Physician Orders Details Patient Name: Date of Service: Heather Jefferson [name]. 08/21/2023 9:00 [name]: [id number] Patient Account Number: [id number] Date of Birth/Sex:  Treating RN: April 26, 1976 (47 y.o. [name] Primary Care Provider: Lindwood [name] Other Clinician: Betha [name] Referring Provider: Treating Provider/Extender: [name] in Treatment: 25 The following information was scribed by: [name] The information was scribed for: [name]  [name], [name] ([id number]) [id number] Page 1 of 20 Visit Report for 08/21/2023 Chief Complaint Document Details Patient Name: Date of Service: [name], [name] 08/21/2023 9:00 [name]: [id number] Patient Account Number: [id number] Date of Birth/Sex: Treating RN: 27-Jan-1976 (47 y.o. [name] Primary Care Provider: Lindwood [name] Other Clinician: Betha [name] Referring Provider: Treating Provider/Extender: [name] Weeks in Treatment: 64 Information Obtained from: Patient Chief Complaint Left foot ulcer Electronic Signature(s) Signed: 08/21/2023 9:08:59 AM By: Heather Derry PA-C Entered By: [name] on 08/21/2023 06:08:59 -------------------------------------------------------------------------------- HPI Details Patient Name: Date of Service: Heather Jefferson [name]. 08/21/2023 9:00 [name]: [id number] Patient Account Number: [id number] Date of Birth/Sex: Treating RN: 03-02-1976 (47 y.o. [name] Primary Care Provider: Lindwood [name] Other Clinician: Betha [name] Referring Provider: Treating Provider/Extender: [name] in Treatment: 78 History of Present Illness HPI Description: 01/18/18-She is here for initial evaluation of the left great toe ulcer. She is [name] poor historian in regards to timeframe in detail. She states approximately 4 weeks ago she lacerated her toe on something in the house. She followed up with her primary care who placed her on Bactrim and ultimately [name] second dose of Bactrim prior to coming to wound clinic. She states she has been treating the toe with peroxide, Betadine and [name] Band-Aid. She did not check her blood sugar this morning but checked it yesterday morning it was 327; she is unaware of [name] recent A1c and there are no current records. She saw Dr. she would've orthopedics last week for an old injury to the left ankle, she states he did  not see her toe, nor did she bring it to his attention. She smokes approximately 1 pack cigarettes [name] day. Her social situation is concerning, she arrives this morning with her mother who appears extremely intoxicated/under the influence; her mother was asked to leave the room and be monitored by the patient's grandmother. The patient's aunt then accompanied the patient and the room throughout the rest of the appointment. We had [name] lengthy discussion regarding the deleterious effects of uncontrolled hyperglycemia and smoking as it relates to wound healing and overall health. She was strongly encouraged to decrease her smoking and get her diabetes under better control. She states she is currently on [name] diet and has cut down her [facility] consumption. The left toe is erythematous, macerated and slightly edematous with malodor present. The edema in her left foot is below her baseline, there is no erythema streaking. We will treat her with Santyl, doxycycline; we have ordered and xray, culture and provided [name] Peg assist surgical shoe and cultured the wound. 01/25/18-She is here in follow-up evaluation for [name] left great toe ulcer and presents with an abscess to her suprapubic area. She states her blood sugars remain elevated, feeling "sick" and if levels are below 250, but she is trying. She has made no attempt to decrease her smoking stating that we "can't take away her food in her cigarettes". She has been compliant with offloading using the PEG assist you. She is using Santyl daily. the culture obtained last week grew staph aureus and Enterococcus faecalis; continues on the doxycycline and Augmentin was added on Monday. 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 Bathing/ Shower/ Hygiene May shower; gently cleanse wound with antibacterial soap, rinse and pat dry prior to dressing wounds No tub bath. Off-Loading Total Contact Cast to Left Lower Extremity Open toe surgical shoe - right foot Wound Treatment Wound #12R - Foot Wound Laterality: Plantar, Left, Medial Cleanser: Vashe 5.8 (oz) Discharge Instructions: Use vashe 5.8 (oz) as directed Topical: Gentamicin Discharge Instructions: Apply as directed by provider. [name], [name] ([id number]) [id number] Page 10 of 20 Prim Dressing: Hydrofera Blue Ready Transfer Foam, 2.5x2.5 (in/in) ary Discharge Instructions: Apply Hydrofera Blue Ready to wound bed as directed Secondary Dressing: ABD Pad 5x9 (in/in) Discharge Instructions: Cover with ABD pad Electronic Signature(s) Signed: 08/21/2023 2:19:13 PM By: Heather Derry PA-C Signed: 08/21/2023 4:31:16 PM By: [name] Entered By: [name] on 08/21/2023 07:14:37 -------------------------------------------------------------------------------- Problem List Details Patient Name: Date of Service: Heather Jefferson [name]. 08/21/2023 9:00 [name]  M Medical Record Number: [id number] Patient Account Number: [id number] Date of Birth/Sex: Treating RN: Jul 02, 1976 (47 y.o. 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/16/2022 No Yes severity E11.42 Type 2 diabetes mellitus with diabetic polyneuropathy 03/16/2022 No Yes Z79.4 Long term (current) use of insulin 02/22/2023 No Yes N18.6 End stage renal disease 07/14/2023 No Yes Z99.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 07/14/2023 No Yes L84 Corns and callosities 08/01/2023 No Yes Inactive Problems Resolved Problems [name], [name] ([id number]) [phone].pdf Page 11 of 20 ICD-10 Code Description Active Date Resolved Date L97.512 Non-pressure chronic ulcer of other part of right foot with fat layer exposed 03/16/2022 03/16/2022 L97.811 Non-pressure chronic ulcer of other part of right lower leg limited to breakdown of skin 03/16/2022 03/16/2022 W54.0XXA Bitten by dog, initial encounter 05/17/2023 05/17/2023 S81.801A Unspecified open wound, right lower leg, initial encounter 05/17/2023 05/17/2023 Q03.474 Type 2 diabetes mellitus with other skin ulcer 05/17/2023 05/17/2023 Electronic Signature(s) Signed: 08/21/2023 9:08:53 AM By: Heather Derry PA-C Entered By: [name] on 08/21/2023 06:08:53 -------------------------------------------------------------------------------- Progress Note Details Patient Name: Date of Service: Heather Jefferson [name]. 08/21/2023 9:00 [name]: [id number] Patient Account Number: [id number] Date of Birth/Sex: Treating RN: 12-11-1975 (47 y.o. [name] Primary Care Provider:  Lindwood [name] Other Clinician: Betha [name] Referring Provider: Treating Provider/Extender: [name] in Treatment: 71 Subjective Chief Complaint Information obtained from Patient Left foot ulcer History of Present Illness (HPI) 01/18/18-She is here for initial evaluation of the left great toe ulcer. She is [name] poor historian in regards to timeframe in detail. She states approximately 4 weeks ago she lacerated her toe on something in the house. She followed up with her primary care who placed her on Bactrim and ultimately [name] second dose of Bactrim prior to coming to wound clinic. She states she has been treating the toe with peroxide, Betadine and [name] Band-Aid. 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 Bathing/ Shower/ Hygiene May shower; gently cleanse wound with antibacterial soap, rinse and pat dry prior to dressing wounds No tub bath. Off-Loading Total Contact Cast to Left Lower Extremity Open toe surgical shoe - right foot Wound Treatment Wound #12R - Foot Wound Laterality: Plantar, Left, Medial Cleanser: Vashe 5.8 (oz) Discharge Instructions: Use vashe 5.8 (oz) as directed Topical: Gentamicin Discharge Instructions: Apply as directed by provider. [name], [name] ([id number]) [id number] Page 10 of 20 Prim Dressing: Hydrofera Blue Ready Transfer Foam, 2.5x2.5 (in/in) ary Discharge Instructions: Apply Hydrofera Blue Ready to wound bed as directed Secondary Dressing: ABD Pad 5x9 (in/in) Discharge Instructions: Cover with ABD pad Electronic Signature(s) Signed: 08/21/2023 2:19:13 PM By: Heather Derry PA-C Signed: 08/21/2023 4:31:16 PM By: [name] Entered By: [name] on 08/21/2023 07:14:37 -------------------------------------------------------------------------------- Problem List Details Patient Name: Date of Service: Heather Jefferson [name]. 08/21/2023 9:00 [name]  M Medical Record Number: [id number] Patient Account Number: [id number] Date of Birth/Sex: Treating RN: Jul 02, 1976 (47 y.o. 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/16/2022 No Yes severity E11.42 Type 2 diabetes mellitus with diabetic polyneuropathy 03/16/2022 No Yes Z79.4 Long term (current) use of insulin 02/22/2023 No Yes N18.6 End stage renal disease 07/14/2023 No Yes Z99.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 07/14/2023 No Yes L84 Corns and callosities 08/01/2023 No Yes Inactive Problems Resolved Problems [name], [name] ([id number]) [phone] Page 11 of 20 ICD-10 Code Description Active Date Resolved Date L97.512 Non-pressure chronic ulcer of other part of right foot with fat layer exposed 03/16/2022 03/16/2022 L97.811 Non-pressure chronic ulcer of other part of right lower leg limited to breakdown of skin 03/16/2022 03/16/2022 W54.0XXA Bitten by dog, initial encounter 05/17/2023 05/17/2023 S81.801A Unspecified open wound, right lower leg, initial encounter 05/17/2023 05/17/2023 Q03.474 Type 2 diabetes mellitus with other skin ulcer 05/17/2023 05/17/2023 Electronic Signature(s) Signed: 08/21/2023 9:08:53 AM By: Heather Derry PA-C Entered By: [name] on 08/21/2023 06:08:53 -------------------------------------------------------------------------------- Progress Note Details Patient Name: Date of Service: Heather Jefferson [name]. 08/21/2023 9:00 [name]: [id number] Patient Account Number: [id number] Date of Birth/Sex: Treating RN: 12-11-1975 (47 y.o. [name] Primary Care Provider:  Lindwood [name] Other Clinician: Betha [name] Referring Provider: Treating Provider/Extender: [name] in Treatment: 71 Subjective Chief Complaint Information obtained from Patient Left foot ulcer History of Present Illness (HPI) 01/18/18-She is here for initial evaluation of the left great toe ulcer. She is [name] poor historian in regards to timeframe in detail. She states approximately 4 weeks ago she lacerated her toe on something in the house. She followed up with her primary care who placed her on Bactrim and ultimately [name] second dose of Bactrim prior to coming to wound clinic. She states she has been treating the toe with peroxide, Betadine and [name] Band-Aid. She did not check her  will see patient back for reevaluation in 1 week here in the clinic. If anything worsens or changes patient will contact our office for additional recommendations. Electronic Signature(s) Signed: 08/21/2023 10:52:42 AM By: Heather Derry PA-C Entered By: [name] on 08/21/2023 07:52:42 -------------------------------------------------------------------------------- Total Contact Cast Details Patient Name: Date of Service: [name], [name] 08/21/2023 9:00 [name]: [id number] Patient Account Number: [id number] Date of Birth/Sex: Treating RN: 03/25/1976 (47 y.o. [name] Primary Care Provider: Lindwood [name] Other Clinician: Betha [name] Referring Provider: Treating Provider/Extender: [name] Weeks in Treatment: 40 T Contact Cast Applied for Wound Assessment: otal Wound #12R Left,Medial,Plantar Foot [name], [name] ([id number]) [id number] Page 20 of 20 Performed By: Physician Heather Derry, PA-C The following information was scribed by: [name] The information was scribed for: [name] Post Procedure Diagnosis Same as Pre-procedure Electronic Signature(s) Signed: 08/21/2023 2:19:13 PM By: Heather Derry PA-C Signed: 08/21/2023 4:31:16 PM By: [name] Entered By: [name] on 08/21/2023  07:14:10 -------------------------------------------------------------------------------- SuperBill Details Patient Name: Date of Service: [name] 08/21/2023 Medical Record Number: [id number] Patient Account Number: [id number] Date of Birth/Sex: Treating RN: 02/24/1976 (47 y.o. 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 Z79.4 Long term (current) use of insulin N18.6 End stage renal disease Z99.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 L84 Corns and callosities Facility Procedures : CPT4 Code: 41324401 2 Description: 9445 - APPLY TOTAL CONTACT LEG CAST ICD-10 Diagnosis Description L97.528 Non-pressure chronic ulcer of other part of left foot with other specified sever Modifier: ity Quantity: 1 Physician Procedures : CPT4 Code Description Modifier [id number] - WC PHYS APPLY TOTAL CONTACT CAST ICD-10 Diagnosis Description L97.528 Non-pressure chronic ulcer of other part of left foot with other specified severity Quantity: 1 Electronic Signature(s) Signed: 08/21/2023 10:52:56 AM By: Heather Derry PA-C Entered By: [name] on 08/21/2023 07:52:56  [name], [name] ([id number]) [id number] Page 1 of 20 Visit Report for 08/21/2023 Chief Complaint Document Details Patient Name: Date of Service: [name], [name] 08/21/2023 9:00 [name]: [id number] Patient Account Number: [id number] Date of Birth/Sex: Treating RN: 27-Jan-1976 (47 y.o. [name] Primary Care Provider: Lindwood [name] Other Clinician: Betha [name] Referring Provider: Treating Provider/Extender: [name] Weeks in Treatment: 64 Information Obtained from: Patient Chief Complaint Left foot ulcer Electronic Signature(s) Signed: 08/21/2023 9:08:59 AM By: Heather Derry PA-C Entered By: [name] on 08/21/2023 06:08:59 -------------------------------------------------------------------------------- HPI Details Patient Name: Date of Service: Heather Jefferson [name]. 08/21/2023 9:00 [name]: [id number] Patient Account Number: [id number] Date of Birth/Sex: Treating RN: 03-02-1976 (47 y.o. [name] Primary Care Provider: Lindwood [name] Other Clinician: Betha [name] Referring Provider: Treating Provider/Extender: [name] in Treatment: 78 History of Present Illness HPI Description: 01/18/18-She is here for initial evaluation of the left great toe ulcer. She is [name] poor historian in regards to timeframe in detail. She states approximately 4 weeks ago she lacerated her toe on something in the house. She followed up with her primary care who placed her on Bactrim and ultimately [name] second dose of Bactrim prior to coming to wound clinic. She states she has been treating the toe with peroxide, Betadine and [name] Band-Aid. She did not check her blood sugar this morning but checked it yesterday morning it was 327; she is unaware of [name] recent A1c and there are no current records. She saw Dr. she would've orthopedics last week for an old injury to the left ankle, she states he did  not see her toe, nor did she bring it to his attention. She smokes approximately 1 pack cigarettes [name] day. Her social situation is concerning, she arrives this morning with her mother who appears extremely intoxicated/under the influence; her mother was asked to leave the room and be monitored by the patient's grandmother. The patient's aunt then accompanied the patient and the room throughout the rest of the appointment. We had [name] lengthy discussion regarding the deleterious effects of uncontrolled hyperglycemia and smoking as it relates to wound healing and overall health. She was strongly encouraged to decrease her smoking and get her diabetes under better control. She states she is currently on [name] diet and has cut down her [facility] consumption. The left toe is erythematous, macerated and slightly edematous with malodor present. The edema in her left foot is below her baseline, there is no erythema streaking. We will treat her with Santyl, doxycycline; we have ordered and xray, culture and provided [name] Peg assist surgical shoe and cultured the wound. 01/25/18-She is here in follow-up evaluation for [name] left great toe ulcer and presents with an abscess to her suprapubic area. She states her blood sugars remain elevated, feeling "sick" and if levels are below 250, but she is trying. She has made no attempt to decrease her smoking stating that we "can't take away her food in her cigarettes". She has been compliant with offloading using the PEG assist you. She is using Santyl daily. the culture obtained last week grew staph aureus and Enterococcus faecalis; continues on the doxycycline and Augmentin was added on Monday. The suprapubic area has erythema, no femoral variation, purple discoloration, minimal induration, was accessed with [name] cotton tip applicator with sanguinopurulent drainage, this was cultured, I suspect the OPALENE, NEEB [name] ([id number]) [id number].pdf Page 2 of  20 current antibiotic treatment will cover  [name], [name] ([id number]) [id number] Page 1 of 20 Visit Report for 08/21/2023 Chief Complaint Document Details Patient Name: Date of Service: [name], [name] 08/21/2023 9:00 [name]: [id number] Patient Account Number: [id number] Date of Birth/Sex: Treating RN: 27-Jan-1976 (47 y.o. [name] Primary Care Provider: Lindwood [name] Other Clinician: Betha [name] Referring Provider: Treating Provider/Extender: [name] Weeks in Treatment: 64 Information Obtained from: Patient Chief Complaint Left foot ulcer Electronic Signature(s) Signed: 08/21/2023 9:08:59 AM By: Heather Derry PA-C Entered By: [name] on 08/21/2023 06:08:59 -------------------------------------------------------------------------------- HPI Details Patient Name: Date of Service: Heather Jefferson [name]. 08/21/2023 9:00 [name]: [id number] Patient Account Number: [id number] Date of Birth/Sex: Treating RN: 03-02-1976 (47 y.o. [name] Primary Care Provider: Lindwood [name] Other Clinician: Betha [name] Referring Provider: Treating Provider/Extender: [name] in Treatment: 78 History of Present Illness HPI Description: 01/18/18-She is here for initial evaluation of the left great toe ulcer. She is [name] poor historian in regards to timeframe in detail. She states approximately 4 weeks ago she lacerated her toe on something in the house. She followed up with her primary care who placed her on Bactrim and ultimately [name] second dose of Bactrim prior to coming to wound clinic. She states she has been treating the toe with peroxide, Betadine and [name] Band-Aid. She did not check her blood sugar this morning but checked it yesterday morning it was 327; she is unaware of [name] recent A1c and there are no current records. She saw Dr. she would've orthopedics last week for an old injury to the left ankle, she states he did  not see her toe, nor did she bring it to his attention. She smokes approximately 1 pack cigarettes [name] day. Her social situation is concerning, she arrives this morning with her mother who appears extremely intoxicated/under the influence; her mother was asked to leave the room and be monitored by the patient's grandmother. The patient's aunt then accompanied the patient and the room throughout the rest of the appointment. We had [name] lengthy discussion regarding the deleterious effects of uncontrolled hyperglycemia and smoking as it relates to wound healing and overall health. She was strongly encouraged to decrease her smoking and get her diabetes under better control. She states she is currently on [name] diet and has cut down her [facility] consumption. The left toe is erythematous, macerated and slightly edematous with malodor present. The edema in her left foot is below her baseline, there is no erythema streaking. We will treat her with Santyl, doxycycline; we have ordered and xray, culture and provided [name] Peg assist surgical shoe and cultured the wound. 01/25/18-She is here in follow-up evaluation for [name] left great toe ulcer and presents with an abscess to her suprapubic area. She states her blood sugars remain elevated, feeling "sick" and if levels are below 250, but she is trying. She has made no attempt to decrease her smoking stating that we "can't take away her food in her cigarettes". She has been compliant with offloading using the PEG assist you. She is using Santyl daily. the culture obtained last week grew staph aureus and Enterococcus faecalis; continues on the doxycycline and Augmentin was added on Monday. The suprapubic area has erythema, no femoral variation, purple discoloration, minimal induration, was accessed with [name] cotton tip applicator with sanguinopurulent drainage, this was cultured, I suspect the OPALENE, NEEB [name] ([id number]) [id number].pdf Page 2 of  20 current antibiotic treatment will cover  will see patient back for reevaluation in 1 week here in the clinic. If anything worsens or changes patient will contact our office for additional recommendations. Electronic Signature(s) Signed: 08/21/2023 10:52:42 AM By: Heather Derry PA-C Entered By: [name] on 08/21/2023 07:52:42 -------------------------------------------------------------------------------- Total Contact Cast Details Patient Name: Date of Service: [name], [name] 08/21/2023 9:00 [name]: [id number] Patient Account Number: [id number] Date of Birth/Sex: Treating RN: 03/25/1976 (47 y.o. [name] Primary Care Provider: Lindwood [name] Other Clinician: Betha [name] Referring Provider: Treating Provider/Extender: [name] Weeks in Treatment: 40 T Contact Cast Applied for Wound Assessment: otal Wound #12R Left,Medial,Plantar Foot [name], [name] ([id number]) [id number] Page 20 of 20 Performed By: Physician Heather Derry, PA-C The following information was scribed by: [name] The information was scribed for: [name] Post Procedure Diagnosis Same as Pre-procedure Electronic Signature(s) Signed: 08/21/2023 2:19:13 PM By: Heather Derry PA-C Signed: 08/21/2023 4:31:16 PM By: [name] Entered By: [name] on 08/21/2023  07:14:10 -------------------------------------------------------------------------------- SuperBill Details Patient Name: Date of Service: [name] 08/21/2023 Medical Record Number: [id number] Patient Account Number: [id number] Date of Birth/Sex: Treating RN: 02/24/1976 (47 y.o. 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 Z79.4 Long term (current) use of insulin N18.6 End stage renal disease Z99.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 L84 Corns and callosities Facility Procedures : CPT4 Code: 41324401 2 Description: 9445 - APPLY TOTAL CONTACT LEG CAST ICD-10 Diagnosis Description L97.528 Non-pressure chronic ulcer of other part of left foot with other specified sever Modifier: ity Quantity: 1 Physician Procedures : CPT4 Code Description Modifier [id number] 64403 - WC PHYS APPLY TOTAL CONTACT CAST ICD-10 Diagnosis Description L97.528 Non-pressure chronic ulcer of other part of left foot with other specified severity Quantity: 1 Electronic Signature(s) Signed: 08/21/2023 10:52:56 AM By: Heather Derry PA-C Entered By: [name] on 08/21/2023 07:52:56  feel like this is much better than last week no debridement even necessary today. We may have to perform some slight debridement come next week we will see how things go. Monitor for any signs of worsening overall and obviously if anything changes she should be letting us know. 08-01-2023 upon evaluation today patient appears to be doing excellent in regard to her feet. The right foot/heel area is completely healed she does have some callus buildup I am going to trim this down [name] little bit I do not want to build up to the point of cracking she is doing well with the urea cream. With regard to the left heel she is doing well with casting I am going to perform some debridement here to clear away before reapplying the cast and I think she is really doing quite well though. 08-07-2023 upon evaluation patient's wound actually showed signs of excellent improvement and actually very pleased with where we stand I do believe that she is making good headway towards complete closure which is great news. 08-14-2023 upon evaluation today patient appears to be doing well currently in regard to her wound. She actually is going require [name] little bit of debridement but fortunately nothing too significant today actually feel like she is doing much better. This is mainly to remove callus on the little slough and biofilm down to good subcutaneous tissue. In general I think that she is making excellent headway here. 08-21-2023 upon evaluation today patient appears to be doing well currently in regard to her wound which is actually showing signs of significant improvement. I am extremely pleased with where things stand I do  not see any signs of infection at this time which is great news and overall I do think that we are making headway towards complete closure which is also excellent news. Electronic Signature(s) Signed: 08/21/2023 10:51:48 AM By: Heather Derry PA-C Entered By: [name] on 08/21/2023 07:51:48 -------------------------------------------------------------------------------- Physical Exam Details Patient Name: Date of Service: [name], [name] 08/21/2023 9:00 [name]: [id number] Patient Account Number: [id number] Date of Birth/Sex: Treating RN: July 31, 1976 (47 y.o. [name] Primary Care Provider: Lindwood [name] Other Clinician: Betha [name] Referring Provider: Treating Provider/Extender: [name] in Treatment: [address], Sarina [name] ([id number]) [id number] Page 9 of 20 Constitutional Well-nourished and well-hydrated in no acute distress. Respiratory normal breathing without difficulty. Psychiatric this patient is able to make decisions and demonstrates good insight into disease process. Alert and Oriented x 3. pleasant and cooperative. Notes Upon inspection patient's wound did not require any sharp debridement this looks to be doing excellent and I do feel like the total contact cast has done [name] great job up to this point. I would recommend as such that we continue with the total contact casting and she is in agreement with the plan. She pretty much stays off of this but when she does have to get up and move around it does help her. Electronic Signature(s) Signed: 08/21/2023 10:52:12 AM By: Heather Derry PA-C Entered By: [name] on 08/21/2023 07:52:11 -------------------------------------------------------------------------------- Physician Orders Details Patient Name: Date of Service: Heather Jefferson [name]. 08/21/2023 9:00 [name]: [id number] Patient Account Number: [id number] Date of Birth/Sex:  Treating RN: April 26, 1976 (47 y.o. [name] Primary Care Provider: Lindwood [name] Other Clinician: Betha [name] Referring Provider: Treating Provider/Extender: [name] in Treatment: 25 The following information was scribed by: [name] The information was scribed for: [name]  [name], [name] ([id number]) [id number] Page 1 of 20 Visit Report for 08/21/2023 Chief Complaint Document Details Patient Name: Date of Service: [name], [name] 08/21/2023 9:00 [name]: [id number] Patient Account Number: [id number] Date of Birth/Sex: Treating RN: 27-Jan-1976 (47 y.o. [name] Primary Care Provider: Lindwood [name] Other Clinician: Betha [name] Referring Provider: Treating Provider/Extender: [name] Weeks in Treatment: 64 Information Obtained from: Patient Chief Complaint Left foot ulcer Electronic Signature(s) Signed: 08/21/2023 9:08:59 AM By: Heather Derry PA-C Entered By: [name] on 08/21/2023 06:08:59 -------------------------------------------------------------------------------- HPI Details Patient Name: Date of Service: Heather Jefferson [name]. 08/21/2023 9:00 [name]: [id number] Patient Account Number: [id number] Date of Birth/Sex: Treating RN: 03-02-1976 (47 y.o. [name] Primary Care Provider: Lindwood [name] Other Clinician: Betha [name] Referring Provider: Treating Provider/Extender: [name] in Treatment: 78 History of Present Illness HPI Description: 01/18/18-She is here for initial evaluation of the left great toe ulcer. She is [name] poor historian in regards to timeframe in detail. She states approximately 4 weeks ago she lacerated her toe on something in the house. She followed up with her primary care who placed her on Bactrim and ultimately [name] second dose of Bactrim prior to coming to wound clinic. She states she has been treating the toe with peroxide, Betadine and [name] Band-Aid. She did not check her blood sugar this morning but checked it yesterday morning it was 327; she is unaware of [name] recent A1c and there are no current records. She saw Dr. she would've orthopedics last week for an old injury to the left ankle, she states he did  not see her toe, nor did she bring it to his attention. She smokes approximately 1 pack cigarettes [name] day. Her social situation is concerning, she arrives this morning with her mother who appears extremely intoxicated/under the influence; her mother was asked to leave the room and be monitored by the patient's grandmother. The patient's aunt then accompanied the patient and the room throughout the rest of the appointment. We had [name] lengthy discussion regarding the deleterious effects of uncontrolled hyperglycemia and smoking as it relates to wound healing and overall health. She was strongly encouraged to decrease her smoking and get her diabetes under better control. She states she is currently on [name] diet and has cut down her [facility] consumption. The left toe is erythematous, macerated and slightly edematous with malodor present. The edema in her left foot is below her baseline, there is no erythema streaking. We will treat her with Santyl, doxycycline; we have ordered and xray, culture and provided [name] Peg assist surgical shoe and cultured the wound. 01/25/18-She is here in follow-up evaluation for [name] left great toe ulcer and presents with an abscess to her suprapubic area. She states her blood sugars remain elevated, feeling "sick" and if levels are below 250, but she is trying. She has made no attempt to decrease her smoking stating that we "can't take away her food in her cigarettes". She has been compliant with offloading using the PEG assist you. She is using Santyl daily. the culture obtained last week grew staph aureus and Enterococcus faecalis; continues on the doxycycline and Augmentin was added on Monday. 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 Bathing/ Shower/ Hygiene May shower; gently cleanse wound with antibacterial soap, rinse and pat dry prior to dressing wounds No tub bath. Off-Loading Total Contact Cast to Left Lower Extremity Open toe surgical shoe - right foot Wound Treatment Wound #12R - Foot Wound Laterality: Plantar, Left, Medial Cleanser: Vashe 5.8 (oz) Discharge Instructions: Use vashe 5.8 (oz) as directed Topical: Gentamicin Discharge Instructions: Apply as directed by provider. [name], [name] ([id number]) [id number] Page 10 of 20 Prim Dressing: Hydrofera Blue Ready Transfer Foam, 2.5x2.5 (in/in) ary Discharge Instructions: Apply Hydrofera Blue Ready to wound bed as directed Secondary Dressing: ABD Pad 5x9 (in/in) Discharge Instructions: Cover with ABD pad Electronic Signature(s) Signed: 08/21/2023 2:19:13 PM By: Heather Derry PA-C Signed: 08/21/2023 4:31:16 PM By: [name] Entered By: [name] on 08/21/2023 07:14:37 -------------------------------------------------------------------------------- Problem List Details Patient Name: Date of Service: Heather Jefferson [name]. 08/21/2023 9:00 [name]  M Medical Record Number: [id number] Patient Account Number: [id number] Date of Birth/Sex: Treating RN: Jul 02, 1976 (47 y.o. 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/16/2022 No Yes severity E11.42 Type 2 diabetes mellitus with diabetic polyneuropathy 03/16/2022 No Yes Z79.4 Long term (current) use of insulin 02/22/2023 No Yes N18.6 End stage renal disease 07/14/2023 No Yes Z99.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 07/14/2023 No Yes L84 Corns and callosities 08/01/2023 No Yes Inactive Problems Resolved Problems [name], [name] ([id number]) [phone].pdf Page 11 of 20 ICD-10 Code Description Active Date Resolved Date L97.512 Non-pressure chronic ulcer of other part of right foot with fat layer exposed 03/16/2022 03/16/2022 L97.811 Non-pressure chronic ulcer of other part of right lower leg limited to breakdown of skin 03/16/2022 03/16/2022 W54.0XXA Bitten by dog, initial encounter 05/17/2023 05/17/2023 S81.801A Unspecified open wound, right lower leg, initial encounter 05/17/2023 05/17/2023 Q03.474 Type 2 diabetes mellitus with other skin ulcer 05/17/2023 05/17/2023 Electronic Signature(s) Signed: 08/21/2023 9:08:53 AM By: Heather Derry PA-C Entered By: [name] on 08/21/2023 06:08:53 -------------------------------------------------------------------------------- Progress Note Details Patient Name: Date of Service: Heather Jefferson [name]. 08/21/2023 9:00 [name]: [id number] Patient Account Number: [id number] Date of Birth/Sex: Treating RN: 12-11-1975 (47 y.o. [name] Primary Care Provider:  Lindwood [name] Other Clinician: Betha [name] Referring Provider: Treating Provider/Extender: [name] in Treatment: 71 Subjective Chief Complaint Information obtained from Patient Left foot ulcer History of Present Illness (HPI) 01/18/18-She is here for initial evaluation of the left great toe ulcer. She is [name] poor historian in regards to timeframe in detail. She states approximately 4 weeks ago she lacerated her toe on something in the house. She followed up with her primary care who placed her on Bactrim and ultimately [name] second dose of Bactrim prior to coming to wound clinic. She states she has been treating the toe with peroxide, Betadine and [name] Band-Aid. 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 Bathing/ Shower/ Hygiene May shower; gently cleanse wound with antibacterial soap, rinse and pat dry prior to dressing wounds No tub bath. Off-Loading Total Contact Cast to Left Lower Extremity Open toe surgical shoe - right foot Wound Treatment Wound #12R - Foot Wound Laterality: Plantar, Left, Medial Cleanser: Vashe 5.8 (oz) Discharge Instructions: Use vashe 5.8 (oz) as directed Topical: Gentamicin Discharge Instructions: Apply as directed by provider. [name], [name] ([id number]) [id number] Page 10 of 20 Prim Dressing: Hydrofera Blue Ready Transfer Foam, 2.5x2.5 (in/in) ary Discharge Instructions: Apply Hydrofera Blue Ready to wound bed as directed Secondary Dressing: ABD Pad 5x9 (in/in) Discharge Instructions: Cover with ABD pad Electronic Signature(s) Signed: 08/21/2023 2:19:13 PM By: Heather Derry PA-C Signed: 08/21/2023 4:31:16 PM By: [name] Entered By: [name] on 08/21/2023 07:14:37 -------------------------------------------------------------------------------- Problem List Details Patient Name: Date of Service: Heather Jefferson [name]. 08/21/2023 9:00 [name]  M Medical Record Number: [id number] Patient Account Number: [id number] Date of Birth/Sex: Treating RN: Jul 02, 1976 (47 y.o. 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/16/2022 No Yes severity E11.42 Type 2 diabetes mellitus with diabetic polyneuropathy 03/16/2022 No Yes Z79.4 Long term (current) use of insulin 02/22/2023 No Yes N18.6 End stage renal disease 07/14/2023 No Yes Z99.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 07/14/2023 No Yes L84 Corns and callosities 08/01/2023 No Yes Inactive Problems Resolved Problems [name], [name] ([id number]) [phone].pdf Page 11 of 20 ICD-10 Code Description Active Date Resolved Date L97.512 Non-pressure chronic ulcer of other part of right foot with fat layer exposed 03/16/2022 03/16/2022 L97.811 Non-pressure chronic ulcer of other part of right lower leg limited to breakdown of skin 03/16/2022 03/16/2022 W54.0XXA Bitten by dog, initial encounter 05/17/2023 05/17/2023 S81.801A Unspecified open wound, right lower leg, initial encounter 05/17/2023 05/17/2023 Q03.474 Type 2 diabetes mellitus with other skin ulcer 05/17/2023 05/17/2023 Electronic Signature(s) Signed: 08/21/2023 9:08:53 AM By: Heather Derry PA-C Entered By: [name] on 08/21/2023 06:08:53 -------------------------------------------------------------------------------- Progress Note Details Patient Name: Date of Service: Heather Jefferson [name]. 08/21/2023 9:00 [name]: [id number] Patient Account Number: [id number] Date of Birth/Sex: Treating RN: 12-11-1975 (47 y.o. [name] Primary Care Provider:  Lindwood [name] Other Clinician: Betha [name] Referring Provider: Treating Provider/Extender: [name] in Treatment: 71 Subjective Chief Complaint Information obtained from Patient Left foot ulcer History of Present Illness (HPI) 01/18/18-She is here for initial evaluation of the left great toe ulcer. She is [name] poor historian in regards to timeframe in detail. She states approximately 4 weeks ago she lacerated her toe on something in the house. She followed up with her primary care who placed her on Bactrim and ultimately [name] second dose of Bactrim prior to coming to wound clinic. She states she has been treating the toe with peroxide, Betadine and [name] Band-Aid. She did not check her  will see patient back for reevaluation in 1 week here in the clinic. If anything worsens or changes patient will contact our office for additional recommendations. Electronic Signature(s) Signed: 08/21/2023 10:52:42 AM By: Heather Derry PA-C Entered By: [name] on 08/21/2023 07:52:42 -------------------------------------------------------------------------------- Total Contact Cast Details Patient Name: Date of Service: [name], [name] 08/21/2023 9:00 [name]: [id number] Patient Account Number: [id number] Date of Birth/Sex: Treating RN: 03/25/1976 (47 y.o. [name] Primary Care Provider: Lindwood [name] Other Clinician: Betha [name] Referring Provider: Treating Provider/Extender: [name] Weeks in Treatment: 40 T Contact Cast Applied for Wound Assessment: otal Wound #12R Left,Medial,Plantar Foot [name], [name] ([id number]) [id number] Page 20 of 20 Performed By: Physician Heather Derry, PA-C The following information was scribed by: [name] The information was scribed for: [name] Post Procedure Diagnosis Same as Pre-procedure Electronic Signature(s) Signed: 08/21/2023 2:19:13 PM By: Heather Derry PA-C Signed: 08/21/2023 4:31:16 PM By: [name] Entered By: [name] on 08/21/2023  07:14:10 -------------------------------------------------------------------------------- SuperBill Details Patient Name: Date of Service: [name] 08/21/2023 Medical Record Number: [id number] Patient Account Number: [id number] Date of Birth/Sex: Treating RN: 02/24/1976 (47 y.o. 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 Z79.4 Long term (current) use of insulin N18.6 End stage renal disease Z99.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 L84 Corns and callosities Facility Procedures : CPT4 Code: [id number] 2 Description: 9445 - APPLY TOTAL CONTACT LEG CAST ICD-10 Diagnosis Description 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 sever Modifier: ity Quantity: 1 Physician Procedures : CPT4 Code Description Modifier [id number] - WC PHYS APPLY TOTAL CONTACT CAST ICD-10 Diagnosis Description L97.528 Non-pressure chronic ulcer of other part of left foot with other specified severity Quantity: 1 Electronic Signature(s) Signed: 08/21/2023 10:52:56 AM By: Heather Derry PA-C Entered By: [name] on 08/21/2023 07:52:56  Verbal / Phone Orders: No Diagnosis Coding ICD-10 Coding Code Description E11.621 Type 2 diabetes mellitus with foot ulcer 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 Bathing/ Shower/ Hygiene May shower; gently cleanse wound with antibacterial soap, rinse and pat dry prior to dressing wounds No tub bath. Off-Loading Total Contact Cast to Left Lower Extremity Open toe surgical shoe - right foot Wound Treatment Wound #12R - Foot Wound Laterality: Plantar, Left, Medial Cleanser: Vashe 5.8 (oz) Discharge Instructions: Use vashe 5.8 (oz) as directed Topical: Gentamicin Discharge Instructions: Apply as directed by provider. [name], [name] ([id number]) [id number] Page 10 of 20 Prim Dressing: Hydrofera Blue Ready Transfer Foam, 2.5x2.5 (in/in) ary Discharge Instructions: Apply Hydrofera Blue Ready to wound bed as directed Secondary Dressing: ABD Pad 5x9 (in/in) Discharge Instructions: Cover with ABD pad Electronic Signature(s) Signed: 08/21/2023 2:19:13 PM By: Heather Derry PA-C Signed: 08/21/2023 4:31:16 PM By: [name] Entered By: [name] on 08/21/2023 07:14:37 -------------------------------------------------------------------------------- Problem List Details Patient Name: Date of Service: Heather Jefferson [name]. 08/21/2023 9:00 [name]  M Medical Record Number: [id number] Patient Account Number: [id number] Date of Birth/Sex: Treating RN: Jul 02, 1976 (47 y.o. 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/16/2022 No Yes severity E11.42 Type 2 diabetes mellitus with diabetic polyneuropathy 03/16/2022 No Yes Z79.4 Long term (current) use of insulin 02/22/2023 No Yes N18.6 End stage renal disease 07/14/2023 No Yes Z99.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 07/14/2023 No Yes L84 Corns and callosities 08/01/2023 No Yes Inactive Problems Resolved Problems [name], [name] ([id number]) [phone].pdf Page 11 of 20 ICD-10 Code Description Active Date Resolved Date L97.512 Non-pressure chronic ulcer of other part of right foot with fat layer exposed 03/16/2022 03/16/2022 L97.811 Non-pressure chronic ulcer of other part of right lower leg limited to breakdown of skin 03/16/2022 03/16/2022 W54.0XXA Bitten by dog, initial encounter 05/17/2023 05/17/2023 S81.801A Unspecified open wound, right lower leg, initial encounter 05/17/2023 05/17/2023 Q03.474 Type 2 diabetes mellitus with other skin ulcer 05/17/2023 05/17/2023 Electronic Signature(s) Signed: 08/21/2023 9:08:53 AM By: Heather Derry PA-C Entered By: [name] on 08/21/2023 06:08:53 -------------------------------------------------------------------------------- Progress Note Details Patient Name: Date of Service: Heather Jefferson [name]. 08/21/2023 9:00 [name]: [id number] Patient Account Number: [id number] Date of Birth/Sex: Treating RN: 12-11-1975 (47 y.o. [name] Primary Care Provider:  Lindwood [name] Other Clinician: Betha [name] Referring Provider: Treating Provider/Extender: [name] in Treatment: 71 Subjective Chief Complaint Information obtained from Patient Left foot ulcer History of Present Illness (HPI) 01/18/18-She is here for initial evaluation of the left great toe ulcer. She is [name] poor historian in regards to timeframe in detail. She states approximately 4 weeks ago she lacerated her toe on something in the house. She followed up with her primary care who placed her on Bactrim and ultimately [name] second dose of Bactrim prior to coming to wound clinic. She states she has been treating the toe with peroxide, Betadine and [name] Band-Aid. 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 Bathing/ Shower/ Hygiene May shower; gently cleanse wound with antibacterial soap, rinse and pat dry prior to dressing wounds No tub bath. Off-Loading Total Contact Cast to Left Lower Extremity Open toe surgical shoe - right foot Wound Treatment Wound #12R - Foot Wound Laterality: Plantar, Left, Medial Cleanser: Vashe 5.8 (oz) Discharge Instructions: Use vashe 5.8 (oz) as directed Topical: Gentamicin Discharge Instructions: Apply as directed by provider. [name], [name] ([id number]) [id number] Page 10 of 20 Prim Dressing: Hydrofera Blue Ready Transfer Foam, 2.5x2.5 (in/in) ary Discharge Instructions: Apply Hydrofera Blue Ready to wound bed as directed Secondary Dressing: ABD Pad 5x9 (in/in) Discharge Instructions: Cover with ABD pad Electronic Signature(s) Signed: 08/21/2023 2:19:13 PM By: Heather Derry PA-C Signed: 08/21/2023 4:31:16 PM By: [name] Entered By: [name] on 08/21/2023 07:14:37 -------------------------------------------------------------------------------- Problem List Details Patient Name: Date of Service: Heather Jefferson [name]. 08/21/2023 9:00 [name]  M Medical Record Number: [id number] Patient Account Number: [id number] Date of Birth/Sex: Treating RN: Jul 02, 1976 (47 y.o. 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/16/2022 No Yes severity E11.42 Type 2 diabetes mellitus with diabetic polyneuropathy 03/16/2022 No Yes Z79.4 Long term (current) use of insulin 02/22/2023 No Yes N18.6 End stage renal disease 07/14/2023 No Yes Z99.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 07/14/2023 No Yes L84 Corns and callosities 08/01/2023 No Yes Inactive Problems Resolved Problems [name], [name] ([id number]) [phone].pdf Page 11 of 20 ICD-10 Code Description Active Date Resolved Date L97.512 Non-pressure chronic ulcer of other part of right foot with fat layer exposed 03/16/2022 03/16/2022 L97.811 Non-pressure chronic ulcer of other part of right lower leg limited to breakdown of skin 03/16/2022 03/16/2022 W54.0XXA Bitten by dog, initial encounter 05/17/2023 05/17/2023 S81.801A Unspecified open wound, right lower leg, initial encounter 05/17/2023 05/17/2023 Q03.474 Type 2 diabetes mellitus with other skin ulcer 05/17/2023 05/17/2023 Electronic Signature(s) Signed: 08/21/2023 9:08:53 AM By: Heather Derry PA-C Entered By: [name] on 08/21/2023 06:08:53 -------------------------------------------------------------------------------- Progress Note Details Patient Name: Date of Service: Heather Jefferson [name]. 08/21/2023 9:00 [name]: [id number] Patient Account Number: [id number] Date of Birth/Sex: Treating RN: 12-11-1975 (47 y.o. [name] Primary Care Provider:  Lindwood [name] Other Clinician: Betha [name] Referring Provider: Treating Provider/Extender: [name] in Treatment: 71 Subjective Chief Complaint Information obtained from Patient Left foot ulcer History of Present Illness (HPI) 01/18/18-She is here for initial evaluation of the left great toe ulcer. She is [name] poor historian in regards to timeframe in detail. She states approximately 4 weeks ago she lacerated her toe on something in the house. She followed up with her primary care who placed her on Bactrim and ultimately [name] second dose of Bactrim prior to coming to wound clinic. She states she has been treating the toe with peroxide, Betadine and [name] Band-Aid. 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 Bathing/ Shower/ Hygiene May shower; gently cleanse wound with antibacterial soap, rinse and pat dry prior to dressing wounds No tub bath. Off-Loading Total Contact Cast to Left Lower Extremity Open toe surgical shoe - right foot Wound Treatment Wound #12R - Foot Wound Laterality: Plantar, Left, Medial Cleanser: Vashe 5.8 (oz) Discharge Instructions: Use vashe 5.8 (oz) as directed Topical: Gentamicin Discharge Instructions: Apply as directed by provider. [name], [name] ([id number]) [id number] Page 10 of 20 Prim Dressing: Hydrofera Blue Ready Transfer Foam, 2.5x2.5 (in/in) ary Discharge Instructions: Apply Hydrofera Blue Ready to wound bed as directed Secondary Dressing: ABD Pad 5x9 (in/in) Discharge Instructions: Cover with ABD pad Electronic Signature(s) Signed: 08/21/2023 2:19:13 PM By: Heather Derry PA-C Signed: 08/21/2023 4:31:16 PM By: [name] Entered By: [name] on 08/21/2023 07:14:37 -------------------------------------------------------------------------------- Problem List Details Patient Name: Date of Service: Heather Jefferson [name]. 08/21/2023 9:00 [name]  M Medical Record Number: [id number] Patient Account Number: [id number] Date of Birth/Sex: Treating RN: Jul 02, 1976 (47 y.o. 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/16/2022 No Yes severity E11.42 Type 2 diabetes mellitus with diabetic polyneuropathy 03/16/2022 No Yes Z79.4 Long term (current) use of insulin 02/22/2023 No Yes N18.6 End stage renal disease 07/14/2023 No Yes Z99.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 07/14/2023 No Yes L84 Corns and callosities 08/01/2023 No Yes Inactive Problems Resolved Problems [name], [name] ([id number]) [phone].pdf Page 11 of 20 ICD-10 Code Description Active Date Resolved Date L97.512 Non-pressure chronic ulcer of other part of right foot with fat layer exposed 03/16/2022 03/16/2022 L97.811 Non-pressure chronic ulcer of other part of right lower leg limited to breakdown of skin 03/16/2022 03/16/2022 W54.0XXA Bitten by dog, initial encounter 05/17/2023 05/17/2023 S81.801A Unspecified open wound, right lower leg, initial encounter 05/17/2023 05/17/2023 Q03.474 Type 2 diabetes mellitus with other skin ulcer 05/17/2023 05/17/2023 Electronic Signature(s) Signed: 08/21/2023 9:08:53 AM By: Heather Derry PA-C Entered By: [name] on 08/21/2023 06:08:53 -------------------------------------------------------------------------------- Progress Note Details Patient Name: Date of Service: Heather Jefferson [name]. 08/21/2023 9:00 [name]: [id number] Patient Account Number: [id number] Date of Birth/Sex: Treating RN: 12-11-1975 (47 y.o. [name] Primary Care Provider:  Lindwood [name] Other Clinician: Betha [name] Referring Provider: Treating Provider/Extender: [name] in Treatment: 71 Subjective Chief Complaint Information obtained from Patient Left foot ulcer History of Present Illness (HPI) 01/18/18-She is here for initial evaluation of the left great toe ulcer. She is [name] poor historian in regards to timeframe in detail. She states approximately 4 weeks ago she lacerated her toe on something in the house. She followed up with her primary care who placed her on Bactrim and ultimately [name] second dose of Bactrim prior to coming to wound clinic. She states she has been treating the toe with peroxide, Betadine and [name] Band-Aid. She did not check her  will see patient back for reevaluation in 1 week here in the clinic. If anything worsens or changes patient will contact our office for additional recommendations. Electronic Signature(s) Signed: 08/21/2023 10:52:42 AM By: Heather Derry PA-C Entered By: [name] on 08/21/2023 07:52:42 -------------------------------------------------------------------------------- Total Contact Cast Details Patient Name: Date of Service: [name], [name] 08/21/2023 9:00 [name]: [id number] Patient Account Number: [id number] Date of Birth/Sex: Treating RN: 03/25/1976 (47 y.o. [name] Primary Care Provider: Lindwood [name] Other Clinician: Betha [name] Referring Provider: Treating Provider/Extender: [name] Weeks in Treatment: 40 T Contact Cast Applied for Wound Assessment: otal Wound #12R Left,Medial,Plantar Foot [name], [name] ([id number]) [id number] Page 20 of 20 Performed By: Physician Heather Derry, PA-C The following information was scribed by: [name] The information was scribed for: [name] Post Procedure Diagnosis Same as Pre-procedure Electronic Signature(s) Signed: 08/21/2023 2:19:13 PM By: Heather Derry PA-C Signed: 08/21/2023 4:31:16 PM By: [name] Entered By: [name] on 08/21/2023  07:14:10 -------------------------------------------------------------------------------- SuperBill Details Patient Name: Date of Service: [name] 08/21/2023 Medical Record Number: [id number] Patient Account Number: [id number] Date of Birth/Sex: Treating RN: 02/24/1976 (47 y.o. 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 Z79.4 Long term (current) use of insulin N18.6 End stage renal disease Z99.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 L84 Corns and callosities Facility Procedures : CPT4 Code: [id number] 2 Description: 9445 - APPLY TOTAL CONTACT LEG CAST ICD-10 Diagnosis Description L97.528 Non-pressure chronic ulcer of other part of left foot with other specified sever Modifier: ity Quantity: 1 Physician Procedures : CPT4 Code Description Modifier [id number] - WC PHYS APPLY TOTAL CONTACT CAST ICD-10 Diagnosis Description L97.528 Non-pressure chronic ulcer of other part of left foot with other specified severity Quantity: 1 Electronic Signature(s) Signed: 08/21/2023 10:52:56 AM By: Heather Derry PA-C Entered By: [name] on 08/21/2023 07:52:56  [name], [name] ([id number]) [id number] Page 1 of 20 Visit Report for 08/21/2023 Chief Complaint Document Details Patient Name: Date of Service: [name], [name] 08/21/2023 9:00 [name]: [id number] Patient Account Number: [id number] Date of Birth/Sex: Treating RN: 27-Jan-1976 (47 y.o. [name] Primary Care Provider: Lindwood [name] Other Clinician: Betha [name] Referring Provider: Treating Provider/Extender: [name] Weeks in Treatment: 64 Information Obtained from: Patient Chief Complaint Left foot ulcer Electronic Signature(s) Signed: 08/21/2023 9:08:59 AM By: Heather Derry PA-C Entered By: [name] on 08/21/2023 06:08:59 -------------------------------------------------------------------------------- HPI Details Patient Name: Date of Service: Heather Jefferson [name]. 08/21/2023 9:00 [name]: [id number] Patient Account Number: [id number] Date of Birth/Sex: Treating RN: 03-02-1976 (47 y.o. [name] Primary Care Provider: Lindwood [name] Other Clinician: Betha [name] Referring Provider: Treating Provider/Extender: [name] in Treatment: 78 History of Present Illness HPI Description: 01/18/18-She is here for initial evaluation of the left great toe ulcer. She is [name] poor historian in regards to timeframe in detail. She states approximately 4 weeks ago she lacerated her toe on something in the house. She followed up with her primary care who placed her on Bactrim and ultimately [name] second dose of Bactrim prior to coming to wound clinic. She states she has been treating the toe with peroxide, Betadine and [name] Band-Aid. She did not check her blood sugar this morning but checked it yesterday morning it was 327; she is unaware of [name] recent A1c and there are no current records. She saw Dr. she would've orthopedics last week for an old injury to the left ankle, she states he did  not see her toe, nor did she bring it to his attention. She smokes approximately 1 pack cigarettes [name] day. Her social situation is concerning, she arrives this morning with her mother who appears extremely intoxicated/under the influence; her mother was asked to leave the room and be monitored by the patient's grandmother. The patient's aunt then accompanied the patient and the room throughout the rest of the appointment. We had [name] lengthy discussion regarding the deleterious effects of uncontrolled hyperglycemia and smoking as it relates to wound healing and overall health. She was strongly encouraged to decrease her smoking and get her diabetes under better control. She states she is currently on [name] diet and has cut down her [facility] consumption. The left toe is erythematous, macerated and slightly edematous with malodor present. The edema in her left foot is below her baseline, there is no erythema streaking. We will treat her with Santyl, doxycycline; we have ordered and xray, culture and provided [name] Peg assist surgical shoe and cultured the wound. 01/25/18-She is here in follow-up evaluation for [name] left great toe ulcer and presents with an abscess to her suprapubic area. She states her blood sugars remain elevated, feeling "sick" and if levels are below 250, but she is trying. She has made no attempt to decrease her smoking stating that we "can't take away her food in her cigarettes". She has been compliant with offloading using the PEG assist you. She is using Santyl daily. the culture obtained last week grew staph aureus and Enterococcus faecalis; continues on the doxycycline and Augmentin was added on Monday. The suprapubic area has erythema, no femoral variation, purple discoloration, minimal induration, was accessed with [name] cotton tip applicator with sanguinopurulent drainage, this was cultured, I suspect the OPALENE, NEEB [name] ([id number]) [id number].pdf Page 2 of  20 current antibiotic treatment will cover  [name], [name] ([id number]) [id number] Page 1 of 20 Visit Report for 08/21/2023 Chief Complaint Document Details Patient Name: Date of Service: [name], [name] 08/21/2023 9:00 [name]: [id number] Patient Account Number: [id number] Date of Birth/Sex: Treating RN: 27-Jan-1976 (47 y.o. [name] Primary Care Provider: Lindwood [name] Other Clinician: Betha [name] Referring Provider: Treating Provider/Extender: [name] Weeks in Treatment: 64 Information Obtained from: Patient Chief Complaint Left foot ulcer Electronic Signature(s) Signed: 08/21/2023 9:08:59 AM By: Heather Derry PA-C Entered By: [name] on 08/21/2023 06:08:59 -------------------------------------------------------------------------------- HPI Details Patient Name: Date of Service: Heather Jefferson [name]. 08/21/2023 9:00 [name]: [id number] Patient Account Number: [id number] Date of Birth/Sex: Treating RN: 03-02-1976 (47 y.o. [name] Primary Care Provider: Lindwood [name] Other Clinician: Betha [name] Referring Provider: Treating Provider/Extender: [name] in Treatment: 78 History of Present Illness HPI Description: 01/18/18-She is here for initial evaluation of the left great toe ulcer. She is [name] poor historian in regards to timeframe in detail. She states approximately 4 weeks ago she lacerated her toe on something in the house. She followed up with her primary care who placed her on Bactrim and ultimately [name] second dose of Bactrim prior to coming to wound clinic. She states she has been treating the toe with peroxide, Betadine and [name] Band-Aid. She did not check her blood sugar this morning but checked it yesterday morning it was 327; she is unaware of [name] recent A1c and there are no current records. She saw Dr. she would've orthopedics last week for an old injury to the left ankle, she states he did  not see her toe, nor did she bring it to his attention. She smokes approximately 1 pack cigarettes [name] day. Her social situation is concerning, she arrives this morning with her mother who appears extremely intoxicated/under the influence; her mother was asked to leave the room and be monitored by the patient's grandmother. The patient's aunt then accompanied the patient and the room throughout the rest of the appointment. We had [name] lengthy discussion regarding the deleterious effects of uncontrolled hyperglycemia and smoking as it relates to wound healing and overall health. She was strongly encouraged to decrease her smoking and get her diabetes under better control. She states she is currently on [name] diet and has cut down her [facility] consumption. The left toe is erythematous, macerated and slightly edematous with malodor present. The edema in her left foot is below her baseline, there is no erythema streaking. We will treat her with Santyl, doxycycline; we have ordered and xray, culture and provided [name] Peg assist surgical shoe and cultured the wound. 01/25/18-She is here in follow-up evaluation for [name] left great toe ulcer and presents with an abscess to her suprapubic area. She states her blood sugars remain elevated, feeling "sick" and if levels are below 250, but she is trying. She has made no attempt to decrease her smoking stating that we "can't take away her food in her cigarettes". She has been compliant with offloading using the PEG assist you. She is using Santyl daily. the culture obtained last week grew staph aureus and Enterococcus faecalis; continues on the doxycycline and Augmentin was added on Monday. The suprapubic area has erythema, no femoral variation, purple discoloration, minimal induration, was accessed with [name] cotton tip applicator with sanguinopurulent drainage, this was cultured, I suspect the OPALENE, NEEB [name] ([id number]) [id number].pdf Page 2 of  20 current antibiotic treatment will cover  [name], [name] ([id number]) [id number] Page 1 of 20 Visit Report for 08/21/2023 Chief Complaint Document Details Patient Name: Date of Service: [name], [name] 08/21/2023 9:00 [name]: [id number] Patient Account Number: [id number] Date of Birth/Sex: Treating RN: 27-Jan-1976 (47 y.o. [name] Primary Care Provider: Lindwood [name] Other Clinician: Betha [name] Referring Provider: Treating Provider/Extender: [name] Weeks in Treatment: 64 Information Obtained from: Patient Chief Complaint Left foot ulcer Electronic Signature(s) Signed: 08/21/2023 9:08:59 AM By: Heather Derry PA-C Entered By: [name] on 08/21/2023 06:08:59 -------------------------------------------------------------------------------- HPI Details Patient Name: Date of Service: Heather Jefferson [name]. 08/21/2023 9:00 [name]: [id number] Patient Account Number: [id number] Date of Birth/Sex: Treating RN: 03-02-1976 (47 y.o. [name] Primary Care Provider: Lindwood [name] Other Clinician: Betha [name] Referring Provider: Treating Provider/Extender: [name] in Treatment: 78 History of Present Illness HPI Description: 01/18/18-She is here for initial evaluation of the left great toe ulcer. She is [name] poor historian in regards to timeframe in detail. She states approximately 4 weeks ago she lacerated her toe on something in the house. She followed up with her primary care who placed her on Bactrim and ultimately [name] second dose of Bactrim prior to coming to wound clinic. She states she has been treating the toe with peroxide, Betadine and [name] Band-Aid. She did not check her blood sugar this morning but checked it yesterday morning it was 327; she is unaware of [name] recent A1c and there are no current records. She saw Dr. she would've orthopedics last week for an old injury to the left ankle, she states he did  not see her toe, nor did she bring it to his attention. She smokes approximately 1 pack cigarettes [name] day. Her social situation is concerning, she arrives this morning with her mother who appears extremely intoxicated/under the influence; her mother was asked to leave the room and be monitored by the patient's grandmother. The patient's aunt then accompanied the patient and the room throughout the rest of the appointment. We had [name] lengthy discussion regarding the deleterious effects of uncontrolled hyperglycemia and smoking as it relates to wound healing and overall health. She was strongly encouraged to decrease her smoking and get her diabetes under better control. She states she is currently on [name] diet and has cut down her [facility] consumption. The left toe is erythematous, macerated and slightly edematous with malodor present. The edema in her left foot is below her baseline, there is no erythema streaking. We will treat her with Santyl, doxycycline; we have ordered and xray, culture and provided [name] Peg assist surgical shoe and cultured the wound. 01/25/18-She is here in follow-up evaluation for [name] left great toe ulcer and presents with an abscess to her suprapubic area. She states her blood sugars remain elevated, feeling "sick" and if levels are below 250, but she is trying. She has made no attempt to decrease her smoking stating that we "can't take away her food in her cigarettes". She has been compliant with offloading using the PEG assist you. She is using Santyl daily. the culture obtained last week grew staph aureus and Enterococcus faecalis; continues on the doxycycline and Augmentin was added on Monday. The suprapubic area has erythema, no femoral variation, purple discoloration, minimal induration, was accessed with [name] cotton tip applicator with sanguinopurulent drainage, this was cultured, I suspect the OPALENE, NEEB [name] ([id number]) [id number] Page 2 of  20 current antibiotic treatment will cover  [name], [name] ([id number]) [id number] Page 1 of 20 Visit Report for 08/21/2023 Chief Complaint Document Details Patient Name: Date of Service: [name], [name] 08/21/2023 9:00 [name]: [id number] Patient Account Number: [id number] Date of Birth/Sex: Treating RN: 27-Jan-1976 (47 y.o. [name] Primary Care Provider: Lindwood [name] Other Clinician: Betha [name] Referring Provider: Treating Provider/Extender: [name] Weeks in Treatment: 64 Information Obtained from: Patient Chief Complaint Left foot ulcer Electronic Signature(s) Signed: 08/21/2023 9:08:59 AM By: Heather Derry PA-C Entered By: [name] on 08/21/2023 06:08:59 -------------------------------------------------------------------------------- HPI Details Patient Name: Date of Service: Heather Jefferson [name]. 08/21/2023 9:00 [name]: [id number] Patient Account Number: [id number] Date of Birth/Sex: Treating RN: 03-02-1976 (47 y.o. [name] Primary Care Provider: Lindwood [name] Other Clinician: Betha [name] Referring Provider: Treating Provider/Extender: [name] in Treatment: 78 History of Present Illness HPI Description: 01/18/18-She is here for initial evaluation of the left great toe ulcer. She is [name] poor historian in regards to timeframe in detail. She states approximately 4 weeks ago she lacerated her toe on something in the house. She followed up with her primary care who placed her on Bactrim and ultimately [name] second dose of Bactrim prior to coming to wound clinic. She states she has been treating the toe with peroxide, Betadine and [name] Band-Aid. She did not check her blood sugar this morning but checked it yesterday morning it was 327; she is unaware of [name] recent A1c and there are no current records. She saw Dr. she would've orthopedics last week for an old injury to the left ankle, she states he did  not see her toe, nor did she bring it to his attention. She smokes approximately 1 pack cigarettes [name] day. Her social situation is concerning, she arrives this morning with her mother who appears extremely intoxicated/under the influence; her mother was asked to leave the room and be monitored by the patient's grandmother. The patient's aunt then accompanied the patient and the room throughout the rest of the appointment. We had [name] lengthy discussion regarding the deleterious effects of uncontrolled hyperglycemia and smoking as it relates to wound healing and overall health. She was strongly encouraged to decrease her smoking and get her diabetes under better control. She states she is currently on [name] diet and has cut down her [facility] consumption. The left toe is erythematous, macerated and slightly edematous with malodor present. The edema in her left foot is below her baseline, there is no erythema streaking. We will treat her with Santyl, doxycycline; we have ordered and xray, culture and provided [name] Peg assist surgical shoe and cultured the wound. 01/25/18-She is here in follow-up evaluation for [name] left great toe ulcer and presents with an abscess to her suprapubic area. She states her blood sugars remain elevated, feeling "sick" and if levels are below 250, but she is trying. She has made no attempt to decrease her smoking stating that we "can't take away her food in her cigarettes". She has been compliant with offloading using the PEG assist you. She is using Santyl daily. the culture obtained last week grew staph aureus and Enterococcus faecalis; continues on the doxycycline and Augmentin was added on Monday. The suprapubic area has erythema, no femoral variation, purple discoloration, minimal induration, was accessed with [name] cotton tip applicator with sanguinopurulent drainage, this was cultured, I suspect the OPALENE, NEEB [name] ([id number]) [id number].pdf Page 2 of  20 current antibiotic treatment will cover  feel like this is much better than last week no debridement even necessary today. We may have to perform some slight debridement come next week we will see how things go. Monitor for any signs of worsening overall and obviously if anything changes she should be letting us know. 08-01-2023 upon evaluation today patient appears to be doing excellent in regard to her feet. The right foot/heel area is completely healed she does have some callus buildup I am going to trim this down [name] little bit I do not want to build up to the point of cracking she is doing well with the urea cream. With regard to the left heel she is doing well with casting I am going to perform some debridement here to clear away before reapplying the cast and I think she is really doing quite well though. 08-07-2023 upon evaluation patient's wound actually showed signs of excellent improvement and actually very pleased with where we stand I do believe that she is making good headway towards complete closure which is great news. 08-14-2023 upon evaluation today patient appears to be doing well currently in regard to her wound. She actually is going require [name] little bit of debridement but fortunately nothing too significant today actually feel like she is doing much better. This is mainly to remove callus on the little slough and biofilm down to good subcutaneous tissue. In general I think that she is making excellent headway here. 08-21-2023 upon evaluation today patient appears to be doing well currently in regard to her wound which is actually showing signs of significant improvement. I am extremely pleased with where things stand I do  not see any signs of infection at this time which is great news and overall I do think that we are making headway towards complete closure which is also excellent news. Electronic Signature(s) Signed: 08/21/2023 10:51:48 AM By: Heather Derry PA-C Entered By: [name] on 08/21/2023 07:51:48 -------------------------------------------------------------------------------- Physical Exam Details Patient Name: Date of Service: [name], [name] 08/21/2023 9:00 [name]: [id number] Patient Account Number: [id number] Date of Birth/Sex: Treating RN: July 31, 1976 (47 y.o. [name] Primary Care Provider: Lindwood [name] Other Clinician: Betha [name] Referring Provider: Treating Provider/Extender: [name] in Treatment: [address], Sarina [name] ([id number]) [id number] Page 9 of 20 Constitutional Well-nourished and well-hydrated in no acute distress. Respiratory normal breathing without difficulty. Psychiatric this patient is able to make decisions and demonstrates good insight into disease process. Alert and Oriented x 3. pleasant and cooperative. Notes Upon inspection patient's wound did not require any sharp debridement this looks to be doing excellent and I do feel like the total contact cast has done [name] great job up to this point. I would recommend as such that we continue with the total contact casting and she is in agreement with the plan. She pretty much stays off of this but when she does have to get up and move around it does help her. Electronic Signature(s) Signed: 08/21/2023 10:52:12 AM By: Heather Derry PA-C Entered By: [name] on 08/21/2023 07:52:11 -------------------------------------------------------------------------------- Physician Orders Details Patient Name: Date of Service: Heather Jefferson [name]. 08/21/2023 9:00 [name]: [id number] Patient Account Number: [id number] Date of Birth/Sex:  Treating RN: April 26, 1976 (47 y.o. [name] Primary Care Provider: Lindwood [name] Other Clinician: Betha [name] Referring Provider: Treating Provider/Extender: [name] in Treatment: 25 The following information was scribed by: [name] The information was scribed for: [name]  [name], [name] ([id number]) [id number] Page 1 of 20 Visit Report for 08/21/2023 Chief Complaint Document Details Patient Name: Date of Service: [name], [name] 08/21/2023 9:00 [name]: [id number] Patient Account Number: [id number] Date of Birth/Sex: Treating RN: 27-Jan-1976 (47 y.o. [name] Primary Care Provider: Lindwood [name] Other Clinician: Betha [name] Referring Provider: Treating Provider/Extender: [name] Weeks in Treatment: 64 Information Obtained from: Patient Chief Complaint Left foot ulcer Electronic Signature(s) Signed: 08/21/2023 9:08:59 AM By: Heather Derry PA-C Entered By: [name] on 08/21/2023 06:08:59 -------------------------------------------------------------------------------- HPI Details Patient Name: Date of Service: Heather Jefferson [name]. 08/21/2023 9:00 [name]: [id number] Patient Account Number: [id number] Date of Birth/Sex: Treating RN: 03-02-1976 (47 y.o. [name] Primary Care Provider: Lindwood [name] Other Clinician: Betha [name] Referring Provider: Treating Provider/Extender: [name] in Treatment: 78 History of Present Illness HPI Description: 01/18/18-She is here for initial evaluation of the left great toe ulcer. She is [name] poor historian in regards to timeframe in detail. She states approximately 4 weeks ago she lacerated her toe on something in the house. She followed up with her primary care who placed her on Bactrim and ultimately [name] second dose of Bactrim prior to coming to wound clinic. She states she has been treating the toe with peroxide, Betadine and [name] Band-Aid. She did not check her blood sugar this morning but checked it yesterday morning it was 327; she is unaware of [name] recent A1c and there are no current records. She saw Dr. she would've orthopedics last week for an old injury to the left ankle, she states he did  not see her toe, nor did she bring it to his attention. She smokes approximately 1 pack cigarettes [name] day. Her social situation is concerning, she arrives this morning with her mother who appears extremely intoxicated/under the influence; her mother was asked to leave the room and be monitored by the patient's grandmother. The patient's aunt then accompanied the patient and the room throughout the rest of the appointment. We had [name] lengthy discussion regarding the deleterious effects of uncontrolled hyperglycemia and smoking as it relates to wound healing and overall health. She was strongly encouraged to decrease her smoking and get her diabetes under better control. She states she is currently on [name] diet and has cut down her [facility] consumption. The left toe is erythematous, macerated and slightly edematous with malodor present. The edema in her left foot is below her baseline, there is no erythema streaking. We will treat her with Santyl, doxycycline; we have ordered and xray, culture and provided [name] Peg assist surgical shoe and cultured the wound. 01/25/18-She is here in follow-up evaluation for [name] left great toe ulcer and presents with an abscess to her suprapubic area. She states her blood sugars remain elevated, feeling "sick" and if levels are below 250, but she is trying. She has made no attempt to decrease her smoking stating that we "can't take away her food in her cigarettes". She has been compliant with offloading using the PEG assist you. She is using Santyl daily. the culture obtained last week grew staph aureus and Enterococcus faecalis; continues on the doxycycline and Augmentin was added on Monday. The suprapubic area has erythema, no femoral variation, purple discoloration, minimal induration, was accessed with [name] cotton tip applicator with sanguinopurulent drainage, this was cultured, I suspect the OPALENE, NEEB [name] ([id number]) [id number] Page 2 of  20 current antibiotic treatment will cover  feel like this is much better than last week no debridement even necessary today. We may have to perform some slight debridement come next week we will see how things go. Monitor for any signs of worsening overall and obviously if anything changes she should be letting us know. 08-01-2023 upon evaluation today patient appears to be doing excellent in regard to her feet. The right foot/heel area is completely healed she does have some callus buildup I am going to trim this down [name] little bit I do not want to build up to the point of cracking she is doing well with the urea cream. With regard to the left heel she is doing well with casting I am going to perform some debridement here to clear away before reapplying the cast and I think she is really doing quite well though. 08-07-2023 upon evaluation patient's wound actually showed signs of excellent improvement and actually very pleased with where we stand I do believe that she is making good headway towards complete closure which is great news. 08-14-2023 upon evaluation today patient appears to be doing well currently in regard to her wound. She actually is going require [name] little bit of debridement but fortunately nothing too significant today actually feel like she is doing much better. This is mainly to remove callus on the little slough and biofilm down to good subcutaneous tissue. In general I think that she is making excellent headway here. 08-21-2023 upon evaluation today patient appears to be doing well currently in regard to her wound which is actually showing signs of significant improvement. I am extremely pleased with where things stand I do  not see any signs of infection at this time which is great news and overall I do think that we are making headway towards complete closure which is also excellent news. Electronic Signature(s) Signed: 08/21/2023 10:51:48 AM By: Heather Derry PA-C Entered By: [name] on 08/21/2023 07:51:48 -------------------------------------------------------------------------------- Physical Exam Details Patient Name: Date of Service: [name], [name] 08/21/2023 9:00 [name]: [id number] Patient Account Number: [id number] Date of Birth/Sex: Treating RN: July 31, 1976 (47 y.o. [name] Primary Care Provider: Lindwood [name] Other Clinician: Betha [name] Referring Provider: Treating Provider/Extender: [name] in Treatment: [address], Sarina [name] ([id number]) [id number] Page 9 of 20 Constitutional Well-nourished and well-hydrated in no acute distress. Respiratory normal breathing without difficulty. Psychiatric this patient is able to make decisions and demonstrates good insight into disease process. Alert and Oriented x 3. pleasant and cooperative. Notes Upon inspection patient's wound did not require any sharp debridement this looks to be doing excellent and I do feel like the total contact cast has done [name] great job up to this point. I would recommend as such that we continue with the total contact casting and she is in agreement with the plan. She pretty much stays off of this but when she does have to get up and move around it does help her. Electronic Signature(s) Signed: 08/21/2023 10:52:12 AM By: Heather Derry PA-C Entered By: [name] on 08/21/2023 07:52:11 -------------------------------------------------------------------------------- Physician Orders Details Patient Name: Date of Service: Heather Jefferson [name]. 08/21/2023 9:00 [name]: [id number] Patient Account Number: [id number] Date of Birth/Sex:  Treating RN: April 26, 1976 (47 y.o. [name] Primary Care Provider: Lindwood [name] Other Clinician: Betha [name] Referring Provider: Treating Provider/Extender: [name] in Treatment: 25 The following information was scribed by: [name] The information was scribed for: [name]  will see patient back for reevaluation in 1 week here in the clinic. If anything worsens or changes patient will contact our office for additional recommendations. Electronic Signature(s) Signed: 08/21/2023 10:52:42 AM By: Heather Derry PA-C Entered By: [name] on 08/21/2023 07:52:42 -------------------------------------------------------------------------------- Total Contact Cast Details Patient Name: Date of Service: [name], [name] 08/21/2023 9:00 [name]: [id number] Patient Account Number: [id number] Date of Birth/Sex: Treating RN: 03/25/1976 (47 y.o. [name] Primary Care Provider: Lindwood [name] Other Clinician: Betha [name] Referring Provider: Treating Provider/Extender: [name] Weeks in Treatment: 40 T Contact Cast Applied for Wound Assessment: otal Wound #12R Left,Medial,Plantar Foot [name], [name] ([id number]) [id number] Page 20 of 20 Performed By: Physician Heather Derry, PA-C The following information was scribed by: [name] The information was scribed for: [name] Post Procedure Diagnosis Same as Pre-procedure Electronic Signature(s) Signed: 08/21/2023 2:19:13 PM By: Heather Derry PA-C Signed: 08/21/2023 4:31:16 PM By: [name] Entered By: [name] on 08/21/2023  07:14:10 -------------------------------------------------------------------------------- SuperBill Details Patient Name: Date of Service: [name] 08/21/2023 Medical Record Number: [id number] Patient Account Number: [id number] Date of Birth/Sex: Treating RN: 02/24/1976 (47 y.o. 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 Z79.4 Long term (current) use of insulin N18.6 End stage renal disease Z99.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 L84 Corns and callosities Facility Procedures : CPT4 Code: 41324401 2 Description: 9445 - APPLY TOTAL CONTACT LEG CAST ICD-10 Diagnosis Description L97.528 Non-pressure chronic ulcer of other part of left foot with other specified sever Modifier: ity Quantity: 1 Physician Procedures : CPT4 Code Description Modifier [id number] - WC PHYS APPLY TOTAL CONTACT CAST ICD-10 Diagnosis Description L97.528 Non-pressure chronic ulcer of other part of left foot with other specified severity Quantity: 1 Electronic Signature(s) Signed: 08/21/2023 10:52:56 AM By: Heather Derry PA-C Entered By: [name] on 08/21/2023 07:52:56  will see patient back for reevaluation in 1 week here in the clinic. If anything worsens or changes patient will contact our office for additional recommendations. Electronic Signature(s) Signed: 08/21/2023 10:52:42 AM By: Heather Derry PA-C Entered By: [name] on 08/21/2023 07:52:42 -------------------------------------------------------------------------------- Total Contact Cast Details Patient Name: Date of Service: [name], [name] 08/21/2023 9:00 [name]: [id number] Patient Account Number: [id number] Date of Birth/Sex: Treating RN: 03/25/1976 (47 y.o. [name] Primary Care Provider: Lindwood [name] Other Clinician: Betha [name] Referring Provider: Treating Provider/Extender: [name] Weeks in Treatment: 40 T Contact Cast Applied for Wound Assessment: otal Wound #12R Left,Medial,Plantar Foot [name], [name] ([id number]) [id number] Page 20 of 20 Performed By: Physician Heather Derry, PA-C The following information was scribed by: [name] The information was scribed for: [name] Post Procedure Diagnosis Same as Pre-procedure Electronic Signature(s) Signed: 08/21/2023 2:19:13 PM By: Heather Derry PA-C Signed: 08/21/2023 4:31:16 PM By: [name] Entered By: [name] on 08/21/2023  07:14:10 -------------------------------------------------------------------------------- SuperBill Details Patient Name: Date of Service: [name] 08/21/2023 Medical Record Number: [id number] Patient Account Number: [id number] Date of Birth/Sex: Treating RN: 02/24/1976 (47 y.o. 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 Z79.4 Long term (current) use of insulin N18.6 End stage renal disease Z99.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 L84 Corns and callosities Facility Procedures : CPT4 Code: 41324401 2 Description: 9445 - APPLY TOTAL CONTACT LEG CAST ICD-10 Diagnosis Description L97.528 Non-pressure chronic ulcer of other part of left foot with other specified sever Modifier: ity Quantity: 1 Physician Procedures : CPT4 Code Description Modifier [id number] - WC PHYS APPLY TOTAL CONTACT CAST ICD-10 Diagnosis Description L97.528 Non-pressure chronic ulcer of other part of left foot with other specified severity Quantity: 1 Electronic Signature(s) Signed: 08/21/2023 10:52:56 AM By: Heather Derry PA-C Entered By: [name] on 08/21/2023 07:52:56  [name], [name] ([id number]) [id number] Page 1 of 20 Visit Report for 08/21/2023 Chief Complaint Document Details Patient Name: Date of Service: [name], [name] 08/21/2023 9:00 [name]: [id number] Patient Account Number: [id number] Date of Birth/Sex: Treating RN: 27-Jan-1976 (47 y.o. [name] Primary Care Provider: Lindwood [name] Other Clinician: Betha [name] Referring Provider: Treating Provider/Extender: [name] Weeks in Treatment: 64 Information Obtained from: Patient Chief Complaint Left foot ulcer Electronic Signature(s) Signed: 08/21/2023 9:08:59 AM By: Heather Derry PA-C Entered By: [name] on 08/21/2023 06:08:59 -------------------------------------------------------------------------------- HPI Details Patient Name: Date of Service: Heather Jefferson [name]. 08/21/2023 9:00 [name]: [id number] Patient Account Number: [id number] Date of Birth/Sex: Treating RN: 03-02-1976 (47 y.o. [name] Primary Care Provider: Lindwood [name] Other Clinician: Betha [name] Referring Provider: Treating Provider/Extender: [name] in Treatment: 78 History of Present Illness HPI Description: 01/18/18-She is here for initial evaluation of the left great toe ulcer. She is [name] poor historian in regards to timeframe in detail. She states approximately 4 weeks ago she lacerated her toe on something in the house. She followed up with her primary care who placed her on Bactrim and ultimately [name] second dose of Bactrim prior to coming to wound clinic. She states she has been treating the toe with peroxide, Betadine and [name] Band-Aid. She did not check her blood sugar this morning but checked it yesterday morning it was 327; she is unaware of [name] recent A1c and there are no current records. She saw Dr. she would've orthopedics last week for an old injury to the left ankle, she states he did  not see her toe, nor did she bring it to his attention. She smokes approximately 1 pack cigarettes [name] day. Her social situation is concerning, she arrives this morning with her mother who appears extremely intoxicated/under the influence; her mother was asked to leave the room and be monitored by the patient's grandmother. The patient's aunt then accompanied the patient and the room throughout the rest of the appointment. We had [name] lengthy discussion regarding the deleterious effects of uncontrolled hyperglycemia and smoking as it relates to wound healing and overall health. She was strongly encouraged to decrease her smoking and get her diabetes under better control. She states she is currently on [name] diet and has cut down her [facility] consumption. The left toe is erythematous, macerated and slightly edematous with malodor present. The edema in her left foot is below her baseline, there is no erythema streaking. We will treat her with Santyl, doxycycline; we have ordered and xray, culture and provided [name] Peg assist surgical shoe and cultured the wound. 01/25/18-She is here in follow-up evaluation for [name] left great toe ulcer and presents with an abscess to her suprapubic area. She states her blood sugars remain elevated, feeling "sick" and if levels are below 250, but she is trying. She has made no attempt to decrease her smoking stating that we "can't take away her food in her cigarettes". She has been compliant with offloading using the PEG assist you. She is using Santyl daily. the culture obtained last week grew staph aureus and Enterococcus faecalis; continues on the doxycycline and Augmentin was added on Monday. The suprapubic area has erythema, no femoral variation, purple discoloration, minimal induration, was accessed with [name] cotton tip applicator with sanguinopurulent drainage, this was cultured, I suspect the OPALENE, NEEB [name] ([id number]) [id number].pdf Page 2 of  20 current antibiotic treatment will cover

## 2023-08-22 NOTE — Progress Notes (Signed)
WYOMA, CROUSHORE A ([id number]) [id number] Page 1 of 8 Visit Report for 08/21/2023 Arrival Information Details Patient Name: Date of Service: ALDIA, MOPPIN 08/21/2023 9:00 A M Medical Record Number: [id number] Patient Account Number: [id number] Date of Birth/Sex: Treating RN: 13-Jul-1976 (47 y.o. [name] Primary Care Glorianna Gott: [name] Other Clinician: [name] Referring Chaunce Winkels: Treating Beena Catano/Extender: [name] in Treatment: 81 Visit Information History Since Last Visit All ordered tests and consults were completed: No Patient Arrived: Wheel Chair Added or deleted any medications: No Arrival Time: 09:03 Any new allergies or adverse reactions: No Transfer Assistance: Manual Had a fall or experienced change in No Patient Identification Verified: Yes activities of daily living that may affect Secondary Verification Process Completed: Yes risk of falls: Patient Requires Transmission-Based Precautions: No Signs or symptoms of abuse/neglect since last visito No Patient Has Alerts: No Hospitalized since last visit: No Implantable device outside of the clinic excluding No cellular tissue based products placed in the center since last visit: Has Dressing in Place as Prescribed: Yes Has Footwear/Offloading in Place as Prescribed: Yes Left: T Contact Cast otal Pain Present Now: No Electronic Signature(s) Signed: 08/21/2023 4:31:16 PM By: [name] Entered By: [name] on 08/21/2023 09:06:33 -------------------------------------------------------------------------------- Clinic Level of Care Assessment Details Patient Name: Date of Service: ANNASTAZIA, MCMURPHY 08/21/2023 9:00 A M Medical Record Number: [id number] Patient Account Number: [id number] Date of Birth/Sex: Treating RN: [birth year] (47 y.o. [name] Primary Care Detavious Rinn: [name] Other Clinician: [name] Referring  Brandii Lakey: Treating Alistair Senft/Extender: [name] in Treatment: 74 Clinic Level of Care Assessment Items TOOL 1 Quantity Score []  - 0 Use when EandM and Procedure is performed on INITIAL visit ASSESSMENTS - Nursing Assessment / Reassessment []  - 0 General Physical Exam (combine w/ comprehensive assessment (listed just below) when performed on new pt. evalsSHANEKWA, MOLT A ([id number]) [id number] Page 2 of 8 []  - 0 Comprehensive Assessment (HX, ROS, Risk Assessments, Wounds Hx, etc.) ASSESSMENTS - Wound and Skin Assessment / Reassessment []  - 0 Dermatologic / Skin Assessment (not related to wound area) ASSESSMENTS - Ostomy and/or Continence Assessment and Care []  - 0 Incontinence Assessment and Management []  - 0 Ostomy Care Assessment and Management (repouching, etc.) PROCESS - Coordination of Care []  - 0 Simple Patient / Family Education for ongoing care []  - 0 Complex (extensive) Patient / Family Education for ongoing care []  - 0 Staff obtains Chiropractor, Records, T Results / Process Orders est []  - 0 Staff telephones HHA, Nursing Homes / Clarify orders / etc []  - 0 Routine Transfer to another Facility (non-emergent condition) []  - 0 Routine Hospital Admission (non-emergent condition) []  - 0 New Admissions / Manufacturing engineer / Ordering NPWT Apligraf, etc. , []  - 0 Emergency Hospital Admission (emergent condition) PROCESS - Special Needs []  - 0 Pediatric / Minor Patient Management []  - 0 Isolation Patient Management []  - 0 Hearing / Language / Visual special needs []  - 0 Assessment of Community assistance (transportation, D/C planning, etc.) []  - 0 Additional assistance / Altered mentation []  - 0 Support Surface(s) Assessment (bed, cushion, seat, etc.) INTERVENTIONS - Miscellaneous []  - 0 External ear exam []  - 0 Patient Transfer (multiple staff / Nurse, adult / Similar devices) []  - 0 Simple Staple /  Suture removal (25 or less) []  - 0 Complex Staple / Suture removal (26 or more) []  - 0 Hypo/Hyperglycemic Management (do not check if billed separately) []  - 0 Ankle /  Abuse / Safety / Falls / Self Care Management Nursing Diagnoses: History of Falls Potential for falls Potential for injury related to falls Goals: Patient will not develop complications from immobility Date Initiated: 03/17/2022 Date Inactivated: 04/20/2022 Target Resolution Date: 03/16/2022 Goal Status: Met Patient/caregiver will verbalize understanding of skin care regimen Date Initiated: 03/17/2022 Date Inactivated: 09/28/2022 Target Resolution Date: 03/16/2022 Goal Status: Met Patient/caregiver will verbalize/demonstrate measure taken to improve self care Date Initiated: 03/17/2022 Target Resolution Date: 06/21/2023 Goal Status: Active Interventions: Assess fall risk on admission and as needed Provide education on basic hygiene Provide education on personal and home safety Notes: Electronic Signature(s) Signed: 08/21/2023 4:31:16 PM By: [name] Signed: 08/22/2023 3:20:47 PM By: Yevonne Pax RN Entered By: [name] on 08/21/2023  10:38:19 -------------------------------------------------------------------------------- Pain Assessment Details Patient Name: Date of Service: [name] 08/21/2023 9:00 A M Medical Record Number: [id number] Patient Account Number: [id number] Date of Birth/Sex: Treating RN: 1975/12/14 (47 y.o. [name] Primary Care Shyquan Stallbaumer: [name] Other Clinician: [name] Referring Limmie Schoenberg: Treating Annah Jasko/Extender: [name] in Treatment: 79 Active Problems Location of Pain Severity and Description of Pain Patient Has Paino No Site Locations [place], [place] A ([id number]) [id number] Page 6 of 8 Pain Management and Medication Current Pain Management: Electronic Signature(s) Signed: 08/21/2023 4:31:16 PM By: [name] Signed: 08/22/2023 3:20:47 PM By: Yevonne Pax RN Entered By: [name] on 08/21/2023 09:17:38 -------------------------------------------------------------------------------- Patient/Caregiver Education Details Patient Name: Date of Service: [name] 10/28/2024andnbsp9:00 A M Medical Record Number: [id number] Patient Account Number: [id number] Date of Birth/Gender: Treating RN: [birth year] (47 y.o. [name] Primary Care Physician: [name] Other Clinician: [name] Referring Physician: Treating Physician/Extender: [name] in Treatment: 18 Education Assessment Education Provided To: Patient Education Topics Provided Wound/Skin Impairment: Handouts: Other: continue wound care as directed Methods: Explain/Verbal Responses: State content correctly Electronic Signature(s) Signed: 08/21/2023 4:31:16 PM By: [name] Entered By: [name] on 08/21/2023 10:38:51 Earmon Phoenix A ([id number]) [id number] Page 7 of 8 -------------------------------------------------------------------------------- Wound  Assessment Details Patient Name: Date of Service: JAJUANA, FRIEZE 08/21/2023 9:00 A M Medical Record Number: [id number] Patient Account Number: [id number] Date of Birth/Sex: Treating RN: 1976-08-12 (47 y.o. [name] Primary Care Seira Cody: [name] Other Clinician: [name] Referring Purl Claytor: Treating Arneta Mahmood/Extender: [name] Weeks in Treatment: 74 Wound Status Wound Number: 12R Primary Diabetic Wound/Ulcer of the Lower Extremity Etiology: Wound Location: Left, Medial, Plantar Foot Wound Open Wounding Event: Pressure Injury Status: Date Acquired: 03/16/2020 Comorbid Chronic sinus problems/congestion, Middle ear problems, Anemia, Weeks Of Treatment: 74 History: Chronic Obstructive Pulmonary Disease (COPD), Congestive Heart Clustered Wound: No Failure, Type II Diabetes, End Stage Renal Disease, History of pressure wounds, Neuropathy Photos Wound Measurements Length: (cm) 0.2 Width: (cm) 0.2 Depth: (cm) 0.1 Area: (cm) 0.031 Volume: (cm) 0.003 % Reduction in Area: 99.3% % Reduction in Volume: 99.7% Epithelialization: Large (67-100%) Wound Description Classification: Grade 3 Wound Margin: Flat and Intact Exudate Amount: Medium Exudate Type: Serosanguineous Exudate Color: red, brown Foul Odor After Cleansing: No Slough/Fibrino Yes Wound Bed Granulation Amount: None Present (0%) Exposed Structure Necrotic Amount: None Present (0%) Fascia Exposed: No Fat Layer (Subcutaneous Tissue) Exposed: Yes Tendon Exposed: No Muscle Exposed: No Joint Exposed: No Bone Exposed: No Treatment Notes Wound #12R (Foot) Wound Laterality: Plantar, Left, Medial Cleanser Vashe 5.8 (oz) Discharge Instruction: Use vashe 5.8 (oz) as directed TIPHANY, MCEACHERN A ([id number]) [id number] Page 8 of 8 Peri-Wound Care Topical Gentamicin  Brachial Index (ABI) - do not check if billed separately Has the patient been seen at the hospital within the last three years: Yes Total Score: 0 Level Of Care: ____ Electronic Signature(s) Signed: 08/21/2023 4:31:16 PM By: [name] Entered By: [name] on 08/21/2023 10:27:19 -------------------------------------------------------------------------------- Encounter Discharge Information Details Patient Name: Date of Service: [name] 08/21/2023 9:00 A M Medical Record Number: [id number] Patient Account Number: [id number] Date of Birth/Sex: Treating RN: 1976/01/30 (47 y.o. Elide, Bouche, Khalessi A ([id number]) [id number] Page 3 of 8 Primary Care Austen Wygant: [name] Other Clinician: [name] Referring Ilka Lovick: Treating Nohealani Medinger/Extender: [name] in Treatment: 33 Encounter Discharge Information Items Discharge Condition: Stable Ambulatory Status: Wheelchair Discharge Destination: Home Transportation: Private Auto Accompanied By: mother Schedule Follow-up Appointment: Yes Clinical Summary of Care: Electronic Signature(s) Signed: 08/21/2023 4:31:16 PM By: [name] Entered By: [name] on 08/21/2023 10:39:51 -------------------------------------------------------------------------------- Lower Extremity Assessment Details Patient Name: Date of Service: RUTHY, DOMINSKI 08/21/2023 9:00 A M Medical Record Number: [id number] Patient Account Number: [id number] Date of Birth/Sex: Treating RN: 21-Dec-1975 (47 y.o. [name] Primary Care Gazelle Towe: [name] Other Clinician: [name] Referring Ericson Nafziger: Treating Lasaro Primm/Extender: [name] Weeks in Treatment: 23 Electronic  Signature(s) Signed: 08/21/2023 4:31:16 PM By: [name] Signed: 08/22/2023 3:20:47 PM By: Yevonne Pax RN Entered By: [name] on 08/21/2023 09:18:40 -------------------------------------------------------------------------------- Multi Wound Chart Details Patient Name: Date of Service: [name], Silva Bandy A. 08/21/2023 9:00 A M Medical Record Number: [id number] Patient Account Number: [id number] Date of Birth/Sex: Treating RN: November 10, 1975 (47 y.o. [name] Primary Care Jayliana Valencia: [name] Other Clinician: [name] Referring Alaysia Lightle: Treating Alla Sloma/Extender: [name] in Treatment: 26 Vital Signs Height(in): 69 Pulse(bpm): 64 Weight(lbs): 178 Blood Pressure(mmHg): 102/60 Body Mass Index(BMI): 26.3 Temperature(F): 98.7 Respiratory Rate(breaths/min): 16 Tangredi, Priyah A ([id number]) [id number] Page 4 of 8 [12R:Photos:] [N/A:N/A] Left, Medial, Plantar Foot N/A N/A Wound Location: Pressure Injury N/A N/A Wounding Event: Diabetic Wound/Ulcer of the Lower N/A N/A Primary Etiology: Extremity Chronic sinus problems/congestion, N/A N/A Comorbid History: Middle ear problems, Anemia, Chronic Obstructive Pulmonary Disease (COPD), Congestive Heart Failure, Type II Diabetes, End Stage Renal Disease, History of pressure wounds, Neuropathy 03/16/2020 N/A N/A Date Acquired: 64 N/A N/A Weeks of Treatment: Open N/A N/A Wound Status: Yes N/A N/A Wound Recurrence: 0.2x0.2x0.1 N/A N/A Measurements L x W x D (cm) 0.031 N/A N/A A (cm) : rea 0.003 N/A N/A Volume (cm) : 99.30% N/A N/A % Reduction in A rea: 99.70% N/A N/A % Reduction in Volume: Grade 3 N/A N/A Classification: Medium N/A N/A Exudate A mount: Serosanguineous N/A N/A Exudate Type: red, brown N/A N/A Exudate Color: Flat and Intact N/A N/A Wound Margin: None Present (0%) N/A N/A Granulation A mount: None Present (0%) N/A N/A Necrotic  A mount: Fat Layer (Subcutaneous Tissue): Yes N/A N/A Exposed Structures: Fascia: No Tendon: No Muscle: No Joint: No Bone: No Large (67-100%) N/A N/A Epithelialization: Treatment Notes Electronic Signature(s) Signed: 08/21/2023 4:31:16 PM By: [name] Entered By: [name] on 08/21/2023 10:13:40 -------------------------------------------------------------------------------- Multi-Disciplinary Care Plan Details Patient Name: Date of Service: [name] 08/21/2023 9:00 A M Medical Record Number: [id number] Patient Account Number: [id number] Date of Birth/Sex: Treating RN: 02-04-1976 (47 y.o. [name] Primary Care Ailey Wessling: [name] Other Clinician: [name] Referring Keiara Sneeringer: Treating Lucita Montoya/Extender: [name] Weeks in Treatment: [address] JERLINE, LUDWIG A ([id number]) [id number] Page 5 of 8  Brachial Index (ABI) - do not check if billed separately Has the patient been seen at the hospital within the last three years: Yes Total Score: 0 Level Of Care: ____ Electronic Signature(s) Signed: 08/21/2023 4:31:16 PM By: [name] Entered By: [name] on 08/21/2023 10:27:19 -------------------------------------------------------------------------------- Encounter Discharge Information Details Patient Name: Date of Service: [name] 08/21/2023 9:00 A M Medical Record Number: [id number] Patient Account Number: [id number] Date of Birth/Sex: Treating RN: 1976/01/30 (47 y.o. Elide, Bouche, Khalessi A ([id number]) [id number] Page 3 of 8 Primary Care Austen Wygant: [name] Other Clinician: [name] Referring Ilka Lovick: Treating Nohealani Medinger/Extender: [name] in Treatment: 33 Encounter Discharge Information Items Discharge Condition: Stable Ambulatory Status: Wheelchair Discharge Destination: Home Transportation: Private Auto Accompanied By: mother Schedule Follow-up Appointment: Yes Clinical Summary of Care: Electronic Signature(s) Signed: 08/21/2023 4:31:16 PM By: [name] Entered By: [name] on 08/21/2023 10:39:51 -------------------------------------------------------------------------------- Lower Extremity Assessment Details Patient Name: Date of Service: RUTHY, DOMINSKI 08/21/2023 9:00 A M Medical Record Number: [id number] Patient Account Number: [id number] Date of Birth/Sex: Treating RN: 21-Dec-1975 (47 y.o. [name] Primary Care Gazelle Towe: [name] Other Clinician: [name] Referring Ericson Nafziger: Treating Lasaro Primm/Extender: [name] Weeks in Treatment: 23 Electronic  Signature(s) Signed: 08/21/2023 4:31:16 PM By: [name] Signed: 08/22/2023 3:20:47 PM By: Yevonne Pax RN Entered By: [name] on 08/21/2023 09:18:40 -------------------------------------------------------------------------------- Multi Wound Chart Details Patient Name: Date of Service: [name], Silva Bandy A. 08/21/2023 9:00 A M Medical Record Number: [id number] Patient Account Number: [id number] Date of Birth/Sex: Treating RN: November 10, 1975 (47 y.o. [name] Primary Care Jayliana Valencia: [name] Other Clinician: [name] Referring Alaysia Lightle: Treating Alla Sloma/Extender: [name] in Treatment: 26 Vital Signs Height(in): 69 Pulse(bpm): 64 Weight(lbs): 178 Blood Pressure(mmHg): 102/60 Body Mass Index(BMI): 26.3 Temperature(F): 98.7 Respiratory Rate(breaths/min): 16 Tangredi, Priyah A ([id number]) [id number] Page 4 of 8 [12R:Photos:] [N/A:N/A] Left, Medial, Plantar Foot N/A N/A Wound Location: Pressure Injury N/A N/A Wounding Event: Diabetic Wound/Ulcer of the Lower N/A N/A Primary Etiology: Extremity Chronic sinus problems/congestion, N/A N/A Comorbid History: Middle ear problems, Anemia, Chronic Obstructive Pulmonary Disease (COPD), Congestive Heart Failure, Type II Diabetes, End Stage Renal Disease, History of pressure wounds, Neuropathy 03/16/2020 N/A N/A Date Acquired: 64 N/A N/A Weeks of Treatment: Open N/A N/A Wound Status: Yes N/A N/A Wound Recurrence: 0.2x0.2x0.1 N/A N/A Measurements L x W x D (cm) 0.031 N/A N/A A (cm) : rea 0.003 N/A N/A Volume (cm) : 99.30% N/A N/A % Reduction in A rea: 99.70% N/A N/A % Reduction in Volume: Grade 3 N/A N/A Classification: Medium N/A N/A Exudate A mount: Serosanguineous N/A N/A Exudate Type: red, brown N/A N/A Exudate Color: Flat and Intact N/A N/A Wound Margin: None Present (0%) N/A N/A Granulation A mount: None Present (0%) N/A N/A Necrotic  A mount: Fat Layer (Subcutaneous Tissue): Yes N/A N/A Exposed Structures: Fascia: No Tendon: No Muscle: No Joint: No Bone: No Large (67-100%) N/A N/A Epithelialization: Treatment Notes Electronic Signature(s) Signed: 08/21/2023 4:31:16 PM By: [name] Entered By: [name] on 08/21/2023 10:13:40 -------------------------------------------------------------------------------- Multi-Disciplinary Care Plan Details Patient Name: Date of Service: [name] 08/21/2023 9:00 A M Medical Record Number: [id number] Patient Account Number: [id number] Date of Birth/Sex: Treating RN: 02-04-1976 (47 y.o. [name] Primary Care Ailey Wessling: [name] Other Clinician: [name] Referring Keiara Sneeringer: Treating Lucita Montoya/Extender: [name] Weeks in Treatment: [address] JERLINE, LUDWIG A ([id number]) [id number] Page 5 of 8

## 2023-08-24 ENCOUNTER — Ambulatory Visit: Payer: MEDICAID | Admitting: Physician Assistant

## 2023-08-28 ENCOUNTER — Encounter: Payer: MEDICAID | Admitting: Physician Assistant

## 2023-08-30 ENCOUNTER — Encounter: Payer: MEDICAID | Attending: Physician Assistant | Admitting: Physician Assistant

## 2023-08-30 DIAGNOSIS — E1142 Type 2 diabetes mellitus with diabetic polyneuropathy: Secondary | ICD-10-CM | POA: Insufficient documentation

## 2023-08-30 DIAGNOSIS — I5022 Chronic systolic (congestive) heart failure: Secondary | ICD-10-CM | POA: Insufficient documentation

## 2023-08-30 DIAGNOSIS — Z99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: Secondary | ICD-10-CM | POA: Insufficient documentation

## 2023-08-30 DIAGNOSIS — E1122 Type 2 diabetes mellitus with diabetic chronic kidney disease: Secondary | ICD-10-CM | POA: Diagnosis not present

## 2023-08-30 DIAGNOSIS — N186 End stage renal disease: Secondary | ICD-10-CM | POA: Diagnosis not present

## 2023-08-30 DIAGNOSIS — L97528 Non-pressure chronic ulcer of other part of left foot with other specified severity: Secondary | ICD-10-CM | POA: Diagnosis not present

## 2023-08-30 DIAGNOSIS — L84 Corns and callosities: Secondary | ICD-10-CM | POA: Insufficient documentation

## 2023-08-30 DIAGNOSIS — E11621 Type 2 diabetes mellitus with foot ulcer: Secondary | ICD-10-CM | POA: Insufficient documentation

## 2023-08-30 DIAGNOSIS — Z794 Long term (current) use of insulin: Secondary | ICD-10-CM | POA: Insufficient documentation

## 2023-08-30 DIAGNOSIS — I132 Hypertensive heart and chronic kidney disease with heart failure and with stage 5 chronic kidney disease, or end stage renal disease: Secondary | ICD-10-CM | POA: Insufficient documentation

## 2023-08-30 NOTE — Progress Notes (Addendum)
[name] ([id number]) [id number] Page 1 of 8 Visit Report for 08/30/2023 Arrival Information Details Patient Name: Date of Service: [name], [name] 08/30/2023 10:00 [name]: [id number] Patient Account Number: [id number] Date of Birth/Sex: Treating RN: Sep 25, 1976 (47 y.o. [name] Primary Care Mystery Schrupp: [name] Other Clinician: [name] Referring Laurieanne Galloway: Treating Roshelle Traub/Extender: [name] in Treatment: 54 Visit Information History Since Last Visit All ordered tests and consults were completed: No Patient Arrived: Wheel Chair Added or deleted any medications: No Arrival Time: 10:04 Any new allergies or adverse reactions: No Transfer Assistance: EasyPivot Patient Lift Had [name] fall or experienced change in No Patient Identification Verified: Yes activities of daily living that may affect Secondary Verification Process Completed: Yes risk of falls: Patient Requires Transmission-Based Precautions: No Signs or symptoms of abuse/neglect since last visito No Patient Has Alerts: No Hospitalized since last visit: No Implantable device outside of the clinic excluding No cellular tissue based products placed in the center since last visit: Has Dressing in Place as Prescribed: Yes Has Footwear/Offloading in Place as Prescribed: Yes Left: T Contact Cast otal Pain Present Now: No Electronic Signature(s) Signed: 08/30/2023 5:39:21 PM By: [name] Entered By: [name] on 08/30/2023 07:09:23 -------------------------------------------------------------------------------- Clinic Level of Care Assessment Details Patient Name: Date of Service: [name], [name] 08/30/2023 10:00 [name]: [id number] Patient Account Number: [id number] Date of Birth/Sex: Treating RN: 08/22/1976 (47 y.o. [name] Primary Care Kristin Barcus: [name] Other Clinician: [name] Referring Mry Lamia: Treating Ayauna Mcnay/Extender: [name] in Treatment: 76 Clinic Level of Care Assessment Items TOOL 1 Quantity Score []  - 0 Use when EandM and Procedure is performed on INITIAL visit ASSESSMENTS - Nursing Assessment / Reassessment []  - 0 General Physical Exam (combine w/ comprehensive assessment (listed just below) when performed on new pt. evalsGRETCHYN, BRACKINS [name] ([id number]) [id number] Page 2 of 8 []  - 0 Comprehensive Assessment (HX, ROS, Risk Assessments, Wounds Hx, etc.) ASSESSMENTS - Wound and Skin Assessment / Reassessment []  - 0 Dermatologic / Skin Assessment (not related to wound area) ASSESSMENTS - Ostomy and/or Continence Assessment and Care []  - 0 Incontinence Assessment and Management []  - 0 Ostomy Care Assessment and Management (repouching, etc.) PROCESS - Coordination of Care []  - 0 Simple Patient / Family Education for ongoing care []  - 0 Complex (extensive) Patient / Family Education for ongoing care []  - 0 Staff obtains Chiropractor, Records, T Results / Process Orders est []  - 0 Staff telephones HHA, Nursing Homes / Clarify orders / etc []  - 0 Routine Transfer to another Facility (non-emergent condition) []  - 0 Routine Hospital Admission (non-emergent condition) []  - 0 New Admissions / Manufacturing engineer / Ordering NPWT Apligraf, etc. , []  - 0 Emergency Hospital Admission (emergent condition) PROCESS - Special Needs []  - 0 Pediatric / Minor Patient Management []  - 0 Isolation Patient Management []  - 0 Hearing / Language / Visual special needs []  - 0 Assessment of Community assistance (transportation, D/C planning, etc.) []  - 0 Additional assistance / Altered mentation []  - 0 Support Surface(s) Assessment (bed, cushion, seat, etc.) INTERVENTIONS - Miscellaneous []  - 0 External ear exam []  - 0 Patient Transfer (multiple staff / Nurse, adult / Similar devices) []  -  0 Simple Staple / Suture removal (25 or less) []  - 0 Complex Staple / Suture removal (26 or more) []  - 0 Hypo/Hyperglycemic Management (do not check if billed separately) []  -  0 Ankle / Brachial Index (ABI) - do not check if billed separately Has the patient been seen at the hospital within the last three years: Yes Total Score: 0 Level Of Care: ____ Electronic Signature(s) Signed: 08/30/2023 5:39:21 PM By: [name] Entered By: [name] on 08/30/2023 08:13:20 -------------------------------------------------------------------------------- Encounter Discharge Information Details Patient Name: Date of Service: [name] 08/30/2023 10:00 [name]: [id number] Patient Account Number: [id number] Date of Birth/Sex: Treating RN: February 19, 1976 (47 y.o. Eisele, Downham, Thomas [name] ([id number]) [id number] Page 3 of 8 Primary Care Muhsin Doris: [name] Other Clinician: [name] Referring Kelcy Laible: Treating Aleyda Gindlesperger/Extender: [name] in Treatment: 20 Encounter Discharge Information Items Discharge Condition: Stable Ambulatory Status: Wheelchair Discharge Destination: Home Transportation: Private Auto Accompanied By: mother Schedule Follow-up Appointment: Yes Clinical Summary of Care: Electronic Signature(s) Signed: 08/30/2023 5:39:21 PM By: [name] Entered By: [name] on 08/30/2023 08:23:58 -------------------------------------------------------------------------------- Lower Extremity Assessment Details Patient Name: Date of Service: [name], [name] 08/30/2023 10:00 [name]: [id number] Patient Account Number: [id number] Date of Birth/Sex: Treating RN: August 26, 1976 (47 y.o. [name] Primary Care Hagen Tidd: [name] Other Clinician: [name] Referring Zaveon Gillen: Treating Samatha Anspach/Extender: [name] Weeks in Treatment:  55 Electronic Signature(s) Signed: 08/30/2023 5:39:21 PM By: [name] Signed: 09/06/2023 4:43:41 PM By: Yevonne Pax RN Entered By: [name] on 08/30/2023 07:23:55 -------------------------------------------------------------------------------- Multi Wound Chart Details Patient Name: Date of Service: [name] 08/30/2023 10:00 [name]: [id number] Patient Account Number: [id number] Date of Birth/Sex: Treating RN: 01-31-1976 (47 y.o. [name] Primary Care Serrina Minogue: [name] Other Clinician: [name] Referring Senon Nixon: Treating Tamecca Artiga/Extender: [name] in Treatment: 48 Vital Signs Height(in): 69 Pulse(bpm): 70 Weight(lbs): 178 Blood Pressure(mmHg): 116/57 Body Mass Index(BMI): 26.3 Temperature(F): 99.1 Respiratory Rate(breaths/min): 18 Badolato, Anisha [name] ([id number]) [phone].pdf Page 4 of 8 [12R:Photos:] [N/[name]:N/[name]] Left, Medial, Plantar Foot N/[name] N/[name] Wound Location: Pressure Injury N/[name] N/[name] Wounding Event: Diabetic Wound/Ulcer of the Lower N/[name] N/[name] Primary Etiology: Extremity Chronic sinus problems/congestion, N/[name] N/[name] Comorbid History: Middle ear problems, Anemia, Chronic Obstructive Pulmonary Disease (COPD), Congestive Heart Failure, Type II Diabetes, End Stage Renal Disease, History of pressure wounds, Neuropathy 03/16/2020 N/[name] N/[name] Date Acquired: 44 N/[name] N/[name] Weeks of Treatment: Open N/[name] N/[name] Wound Status: Yes N/[name] N/[name] Wound Recurrence: 0.1x0.2x0.1 N/[name] N/[name] Measurements L x W x D (cm) 0.016 N/[name] N/[name] [name] (cm) : rea 0.002 N/[name] N/[name] Volume (cm) : 99.70% N/[name] N/[name] % Reduction in [name] rea: 99.80% N/[name] N/[name] % Reduction in Volume: Grade 3 N/[name] N/[name] Classification: Medium N/[name] N/[name] Exudate [name] mount: Serosanguineous N/[name] N/[name] Exudate Type: red, brown N/[name] N/[name] Exudate Color: Flat and Intact N/[name] N/[name] Wound Margin: None Present (0%) N/[name] N/[name] Granulation [name] mount: None Present (0%) N/[name]  N/[name] Necrotic [name] mount: Fat Layer (Subcutaneous Tissue): Yes N/[name] N/[name] Exposed Structures: Fascia: No Tendon: No Muscle: No Joint: No Bone: No Large (67-100%) N/[name] N/[name] Epithelialization: Treatment Notes Electronic Signature(s) Signed: 08/30/2023 5:39:21 PM By: [name] Entered By: [name] on 08/30/2023 07:24:11 -------------------------------------------------------------------------------- Multi-Disciplinary Care Plan Details Patient Name: Date of Service: [name] 08/30/2023 10:00 [name]: [id number] Patient Account Number: [id number] Date of Birth/Sex: Treating RN: 12/15/1975 (47 y.o. [name] Primary Care Keelyn Fjelstad: [name] Other Clinician: [name] Referring Prentis Langdon: Treating Lindia Garms/Extender: [name] Weeks in Treatment: [address] VASSIE, [name] ([id number]) [id number] Page  5 of 8 Abuse / Safety / Falls / Self Care Management Nursing Diagnoses: History of Falls Potential for falls Potential for injury related to falls Goals: Patient will not develop complications from immobility Date Initiated: 03/17/2022 Date Inactivated: 04/20/2022 Target Resolution Date: 03/16/2022 Goal Status: Met Patient/caregiver will verbalize understanding of skin care regimen Date Initiated: 03/17/2022 Date Inactivated: 09/28/2022 Target Resolution Date: 03/16/2022 Goal Status: Met Patient/caregiver will verbalize/demonstrate measure taken to improve self care Date Initiated: 03/17/2022 Target Resolution Date: 06/21/2023 Goal Status: Active Interventions: Assess fall risk on admission and as needed Provide education on basic hygiene Provide education on personal and home safety Notes: Electronic Signature(s) Signed: 08/30/2023 5:39:21 PM By: [name] Signed: 09/06/2023 4:43:41 PM By: Yevonne Pax RN Entered By: [name] on 08/30/2023  08:22:58 -------------------------------------------------------------------------------- Pain Assessment Details Patient Name: Date of Service: [name] 08/30/2023 10:00 [name]: [id number] Patient Account Number: [id number] Date of Birth/Sex: Treating RN: 1975-11-19 (47 y.o. [name] Primary Care Square Jowett: [name] Other Clinician: [name] Referring Jearl Soto: Treating Alveta Quintela/Extender: [name] in Treatment: 24 Active Problems Location of Pain Severity and Description of Pain Patient Has Paino No Site Locations [place], DENNISSE [name] ([id number]) [id number] Page 6 of 8 Pain Management and Medication Current Pain Management: Electronic Signature(s) Signed: 08/30/2023 5:39:21 PM By: [name] Signed: 09/06/2023 4:43:41 PM By: Yevonne Pax RN Entered By: [name] on 08/30/2023 07:13:14 -------------------------------------------------------------------------------- Patient/Caregiver Education Details Patient Name: Date of Service: [name] 11/6/2024andnbsp10:00 [name]: [id number] Patient Account Number: [id number] Date of Birth/Gender: Treating RN: 1976-07-17 (47 y.o. [name] Primary Care Physician: [name] Other Clinician: [name] Referring Physician: Treating Physician/Extender: [name] in Treatment: 47 Education Assessment Education Provided To: Patient Education Topics Provided Wound/Skin Impairment: Handouts: Other: continue wound care as directed Methods: Explain/Verbal Responses: State content correctly Electronic Signature(s) Signed: 08/30/2023 5:39:21 PM By: [name] Entered By: [name] on 08/30/2023 08:23:16 [name] ([id number]) [id number] Page 7 of 8 -------------------------------------------------------------------------------- Wound  Assessment Details Patient Name: Date of Service: [name], [name] 08/30/2023 10:00 [name]: [id number] Patient Account Number: [id number] Date of Birth/Sex: Treating RN: 08-08-1976 (47 y.o. [name] Primary Care Zorian Gunderman: [name] Other Clinician: [name] Referring Threasa Kinch: Treating Treyton Slimp/Extender: [name] Weeks in Treatment: 76 Wound Status Wound Number: [id number] Primary Diabetic Wound/Ulcer of the Lower Extremity Etiology: Wound Location: Left, Medial, Plantar Foot Wound Open Wounding Event: Pressure Injury Status: Date Acquired: 03/16/2020 Comorbid Chronic sinus problems/congestion, Middle ear problems, Anemia, Weeks Of Treatment: 76 History: Chronic Obstructive Pulmonary Disease (COPD), Congestive Heart Clustered Wound: No Failure, Type II Diabetes, End Stage Renal Disease, History of pressure wounds, Neuropathy Photos Wound Measurements Length: (cm) 0.1 Width: (cm) 0.2 Depth: (cm) 0.1 Area: (cm) 0.016 Volume: (cm) 0.002 % Reduction in Area: 99.7% % Reduction in Volume: 99.8% Epithelialization: Large (67-100%) Wound Description Classification: Grade 3 Wound Margin: Flat and Intact Exudate Amount: Medium Exudate Type: Serosanguineous Exudate Color: red, brown Foul Odor After Cleansing: No Slough/Fibrino Yes Wound Bed Granulation Amount: None Present (0%) Exposed Structure Necrotic Amount: None Present (0%) Fascia Exposed: No Fat Layer (Subcutaneous Tissue) Exposed: Yes Tendon Exposed: No Muscle Exposed: No Joint Exposed: No Bone Exposed: No Electronic Signature(s) Signed: 08/30/2023 5:39:21 PM By: [name] Signed: 09/06/2023 4:43:41 PM By: Yevonne Pax RN Entered By: [name] on 08/30/2023 07:23:38 [name] ([id number]) [id number] Page 8 of  8 -------------------------------------------------------------------------------- Vitals Details Patient Name: Date  of Service: [name], [name] 08/30/2023 10:00 [name]: [id number] Patient Account Number: [id number] Date of Birth/Sex: Treating RN: 01-12-1976 (47 y.o. [name] Primary Care Allie Ousley: [name] Other Clinician: [name] Referring Ayomide Purdy: Treating Gianne Shugars/Extender: [name] in Treatment: 33 Vital Signs Time Taken: 10:09 Temperature (F): 99.1 Height (in): 69 Pulse (bpm): 70 Weight (lbs): 178 Respiratory Rate (breaths/min): 18 Body Mass Index (BMI): 26.3 Blood Pressure (mmHg): 116/57 Reference Range: 80 - 120 mg / dl Electronic Signature(s) Signed: 08/30/2023 5:39:21 PM By: [name] Entered By: [name] on 08/30/2023 07:13:04

## 2023-08-30 NOTE — Progress Notes (Signed)
[name], [name] ([id number]) [id number] Page 1 of 20 Visit Report for 08/30/2023 Chief Complaint Document Details Patient Name: Date of Service: [name], [name] 08/30/2023 10:00 [name]: [id number] Patient Account Number: [id number] Date of Birth/Sex: Treating RN: [birth year] (47 y.o. [name] Primary Care Provider: [name] Other Clinician: [name] Referring Provider: Treating Provider/Extender: [name] Weeks in Treatment: 21 Information Obtained from: Patient Chief Complaint Left foot ulcer Electronic Signature(s) Signed: 08/30/2023 10:09:07 AM By: [name] PA-C Entered By: [name] on 08/30/2023 07:09:07 -------------------------------------------------------------------------------- HPI Details Patient Name: Date of Service: Heather Jefferson [name]. 08/30/2023 10:00 [name]: [id number] Patient Account Number: [id number] Date of Birth/Sex: Treating RN: [birth year] (47 y.o. [name] Primary Care Provider: [name] Other Clinician: [name] Referring Provider: Treating Provider/Extender: [name] in Treatment: 36 History of Present Illness HPI Description: 01/18/18-She is here for initial evaluation of the left great toe ulcer. She is [name] poor historian in regards to timeframe in detail. She states approximately 4 weeks ago she lacerated her toe on something in the house. She followed up with her primary care who placed her on Bactrim and ultimately [name] second dose of Bactrim prior to coming to wound clinic. She states she has been treating the toe with peroxide, Betadine and [name] Band-Aid. She did not check her blood sugar this morning but checked it yesterday morning it was 327; she is unaware of [name] recent A1c and there are no current records. She saw Dr. she would've orthopedics last week for an old injury to the left ankle, she states he did not  see her toe, nor did she bring it to his attention. She smokes approximately 1 pack cigarettes [name] day. Her social situation is concerning, she arrives this morning with her mother who appears extremely intoxicated/under the influence; her mother was asked to leave the room and be monitored by the patient's grandmother. The patient's aunt then accompanied the patient and the room throughout the rest of the appointment. We had [name] lengthy discussion regarding the deleterious effects of uncontrolled hyperglycemia and smoking as it relates to wound healing and overall health. She was strongly encouraged to decrease her smoking and get her diabetes under better control. She states she is currently on [name] diet and has cut down her [facility] consumption. The left toe is erythematous, macerated and slightly edematous with malodor present. The edema in her left foot is below her baseline, there is no erythema streaking. We will treat her with Santyl, doxycycline; we have ordered and xray, culture and provided [name] Peg assist surgical shoe and cultured the wound. 01/25/18-She is here in follow-up evaluation for [name] left great toe ulcer and presents with an abscess to her suprapubic area. She states her blood sugars remain elevated, feeling "sick" and if levels are below 250, but she is trying. She has made no attempt to decrease her smoking stating that we "can't take away her food in her cigarettes". She has been compliant with offloading using the PEG assist you. She is using Santyl daily. the culture obtained last week grew staph aureus and Enterococcus faecalis; continues on the doxycycline and Augmentin was added on Monday. The suprapubic area has erythema, no femoral variation, purple discoloration, minimal induration, was accessed with [name] cotton tip applicator with sanguinopurulent drainage, this was cultured, I suspect the [name], [name] ([id number]) [id number] Page 2 of 20 current  antibiotic treatment will cover  and we will not add anything to her current treatment plan. She was advised to go to urgent care or ER with any change in redness, induration or fever. 02/01/18-She is here in follow-up evaluation for left great toe ulcers and [name] new abdominal abscess from last week. She was able to use packing until earlier this week, where she "forgot it was there". She states she was feeling ill with GI symptoms last week and was not taking her antibiotic. She states her glucose levels have been predominantly less than 200, with occasional levels between 200-250. She thinks this was contributing to her GI symptoms as they have resolved without intervention. There continues to be significant laceration to left toe, otherwise it clinically looks stable/improved. There is now less superficial opening to the lateral aspect of the great toe that was residual blister. We will transition to [facility] to all wounds, she will continue her Augmentin. If there is no change or deterioration next week for reculture. 02/08/18-She is here in follow-up evaluation for left great toe ulcer and abdominal ulcer. There is an improvement in both wounds. She has been wrapping her left toe with coban, not by our direction, which has created an area of discoloration to the medial aspect; she has been advised to NOT use coban secondary to her neuropathy. She states her glucose levels have been high over this last week ranging from 200-350, she continues to smoke. She admits to being less compliant with her offloading shoe. We will continue with same treatment plan and she will follow-up next week. 02/15/18-She is here in follow-up evaluation for left great toe ulcer and abdominal ulcer. The abdominal ulcer is epithelialized. The left great toe ulcer is improved and all injury from last week using the Coban wrap is resolved, the lateral ulcer is healed. She admits to noncompliance with wearing offloading shoe  and admits to glucose levels being greater than 300 most of the week. She continues to smoke and expresses no desire to quit. There is one area medially that probes deeper than it has historically, erythema to the toe and dorsal foot has consistently waxed and waned. There is no overt signs of cellulitis or infection but we will culture the wound for any occult infection given the new area of depth and erythema. We will hold off on sensitivities for initiation of antibiotic therapy. 02/22/18-She is here in follow up evaluation for left great toe ulcer. There is overall significant improvement in both wound appearance, erythema and edema with changes made last week. She was not initiated on antibiotic therapy. Culture obtained last week showed oxacillin sensitive staph aureus, sensitive to clindamycin. Clindamycin has been called into the pharmacy but she has been instructed to hold off on initiation secondary to overall clinical improvement and her history of antibiotic intolerance. She has been instructed to contact the clinic with any noted changes/deterioration and the wound, erythema, edema and/or pain. She will follow-up next week. She continues to smoke and her glucose levels remain elevated >250; she admits to compliance with offloading shoe 03/01/18 on evaluation today patient appears to be doing fairly well in regard to her left first toe ulcer. She has been tolerating the dressing changes with the [facility] Dressing without complication and overall this has definitely showed signs of improvement according to records as well is what the patient tells me today. I'm very pleased in that regard. She is having no pain today 03/08/18 She is here for follow up evaluation of [name] left  great toe ulcer. She remains non-compliant with glucose control and smoking cessation; glucose levels consistently >200. She states that she got new shoe inserts/peg assist. She admits to compliance with offloading. Since  my last evaluation there is significant improvement. We will switch to prisma at this time and she will follow up next week. She is noted to be tachycardic at this appointment, heart rate 120s; she has [name] history of heart rate 70-130 according to our records. She admits to extreme agitation r/t personal issues; she was advised to monitor her heartrate and contact her physician if it does not return to [name] more normal range (<100). She takes cardizem twice daily. 03/15/18-She is here in follow-up evaluation for left great toe ulcer. She remains noncompliant with glucose control and smoking cessation. She admits to compliance with wearing offloading shoe. The ulcer is improved/stable and we will continue with the same treatment plan and she will follow-up next week 03/22/18-She is here for evaluation for left great toe ulcer. There continues to be significant improvement despite recurrent hyperglycemia (over 500 yesterday) and she continues to smoke. She has been compliant with offloading and we will continue with same treatment plan and she will follow-up next week. 03/29/18-She is here for evaluation for left great toe ulcer. Despite continuing to smoke and uncontrolled diabetes she continues to improve. She is compliant with offloading shoe. We will continue with the same treatment plan and she will follow-up next week 04/05/18- She is here in follow up evaluation for [name] left great toe ulcer; she presents with small pustule to left fifth toe (resembles ant bite). She admits to compliance with wearing offloading shoe; continues to smoke or have uncontrolled blood glucose control. There is more callus than usual with evidence of bleeding; she denies known trauma. 04/12/18-She is here for evaluation of left great toe ulcer. Despite noncompliance with glycemic control and smoking she continues to make improvement. She continues to wear offloading shoe. The pustule, that was identified last week, to the left fifth  toe is resolved. She will follow-up in 2 weeks 05/03/18-she is seen in follow-up evaluation for [name] left great toe ulcer. She is compliant with offloading, otherwise noncompliant with glycemic control and smoking. She has plateaued and there is minimal improvement noted. We will transition to [facility], replaced the insert to her surgical shoe and she will follow-up in one week 05/10/18- She is here in follow up evaluation for [name] left great toe ulcer. It appears stable despite measurement change. We will continue with same treatment plan and follow up next week. 05/24/18-She is seen in follow-up evaluation for [name] left great toe ulcer. She remains compliant with offloading, has made significant improvement in her diet, decreasing the amount of sugar/soda. She said her recent A1c was 10.9 which is lower than. She did see [name] diabetic nutritionist/educator yesterday. She continues to smoke. We will continue with the same treatment plan and she'll follow-up next week. 05/31/18- She is seen in follow-up evaluation for left great toe ulcer. She continues to remain compliant with offloading, continues to make improvement in her diet, increasing her water and decreasing the amount of sugar/soda. She does continue to smoke with no desire to quit. We will apply Prisma to the depth and Hydrofera Blue over. We have not received insurance authorization for oasis. She will follow up next week. 06/07/18-She is seen in follow-up evaluation for left great toe ulcer. It has stalled according to today's measurements although base appears stable. She says she saw  [name] diabetic educator yesterday; her average blood sugars are less than 300 which is an improvement for her. She continues to smoke and states "that's my next step" She continues with water over soda. We will order for xray, culture and reinstate ace wrap compression prior to placing apligraf for next week. She is voicing no complaints or concerns. Her dressing will  change to iodoflex over the next week in preparation for apligraf. 06/14/18-She is seen in follow-up evaluation for left great toe ulcer. Plain film x-ray performed last week was negative for osteomyelitis. Wound culture obtained last week grew strep B and OSSA; she is initiated on keflex and cefdinir today; there is erythema to the toe which could be from ace wrap compression, she has [name] history of wrapping too tight and has has been encouraged to maintain ace wraps that we place today. We will hold off on application of apligraf today, will apply next week after antibiotic therapy has been initiated. She admits today that she has resumed taking [name] shower with her foot/toe submerged in water, she has been reminded to keep foot/toe out of the bath water. She will be seen in follow up next week 06/21/18-she is seen in follow-up evaluation for left great toe ulcer. She is tolerating antibiotic therapy with no GI disturbance. The wound is stable. Apligraf was applied today. She has been decreasing her smoking, only had 4 cigarettes yesterday and 1 today. She continues being more compliant in diabetic diet. She will follow-up next week for evaluation of site, if stable will remove at 2 weeks. 06/28/18- She is here in follow up evalution. Apligraf was placed last week, she states the dressing fell off on Tuesday and she was dressing with hydrofera blue. She is healed and will be discharged from the clinic today. She has been instructed to continue with smoking cessation, continue monitoring glucose levels, offloading for an additional 4 weeks and continue with hydrofera blue for additional two weeks for any possible microscopic opening. Readmission: 08/07/18 on evaluation today patient presents for reevaluation concerning the ulcer of her right great toe. She was previously discharged on 06/28/18 healed. Nonetheless she states that this began to show signs of drainage she subsequently went to her primary care  provider. Subsequently an x-ray was performed on 08/01/18 which was negative. The patient was also placed on antibiotics at that time. Fortunately they should have been effective for the infection. Nonetheless she's been experiencing some improvement but still has [name] lot of drainage coming from the wound itself. 08/14/18 on evaluation today patient's wound actually does show signs of improvement in regard to the erythema at this point. She has completed the antibiotics. With that being said we did discuss the possibility of placing her in [name] total contact cast as of today although I think that I may want to give this just [name] little bit more time to ensure nothing recurrence as far as her infection is concerned. I do not want to put in the cast and risk infection at that time if things are not completely resolved. With that being said she is gonna require some debridement today. 08/21/18 on evaluation today patient actually appears to be doing okay in regard to her toe ulcer. She's been tolerating the dressing changes without complication. With that being said it does appear that she is ready and in fact I think it's appropriate for [place] to go ahead and initiate the total contact cast today. Nonetheless she will require some sharp debridement to prepare  the wound for application. Overall I feel like things have been progressing well but we do need to do something to get this to close more readily. 08/24/18 patient seen today for reevaluation after having had the total contact cast applied on Tuesday. She seems to have done very well the wound appears to be doing great and overall I'm pleased with the progress that she's made. There were no abnormal areas of rubbing from the cast on her lower extremity. 08/30/18 on evaluation today patient actually appears to be completely healed in regard to her plantar toe ulcer. She tells me at this point she's been having [name] lot [name], [name] ([id number])  [id number] Page 3 of 20 of issues with the cast. She almost fell [name] couple of times the state shall the step of her dog [name] couple times as well. This is been [name] very frustrating process for her other nonetheless she has completely healed the wound which is excellent news. Overall there does not appear to be the evidence of infection at this time which is great news. 09/11/18 evaluation today patient presents for follow-up concerning her great toe ulcer on the left which has unfortunately reopened since I last saw her which was only [name] couple of weeks ago. Unfortunately she was not able to get in to get the shoe and potentially the AFO that's gonna be necessary due to her left foot drop. She continues with offloading shoe but this is not enough to prevent her from reopening it appears. When we last had her in the total contact cast she did well from [name] healing standpoint but unfortunately the wound reopened as soon as she came out of the cast within just [name] couple of weeks. 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 This is also part of the reason why she falls on [name] regular basis. I just do not believe that is gonna be safe for [place] to reinitiate the total contact cast as last time we had this on she fell 3 times one week which is definitely not normal for her. 09/18/18 upon evaluation today the patient actually appears to be doing about the same in regard to her toe ulcer. She did not contact [employer] as I asked her to even though I had given her the prescription. In fact she actually states that she has no idea where the prescription is. She did apparently call [employer] and they told her that all she needed to do was bring the prescription in order to be able to be seen and work on  getting the AFO for her left foot. With all that being said she still does not have an appointment and I'm not sure were things stand that regard. I will give her [name] new prescription today in order to contact them to get this set up. 09/25/18 on evaluation today patient actually appears to be doing about the same in regard to her toes ulcer. She does have [name] small areas which seems to have [name] lot of callous buildup around the edge of the wound which is going to need sharp debridement today. She still is waiting to be scheduled for evaluation with [employer] for possibility of an AFO. She states there supposed to call her tomorrow to get this set up.  Unfortunately it does appear that her foot specifically the toe area is showing signs of erythema. There does not appear to be any systemic infection which is in these good news. 10/02/18 on evaluation today patient actually appears to be doing about the same in regard to her toe ulcer. This really has not done too well although it's not significantly larger it's also not significantly smaller. She has been tolerating the dressing changes without complication. She actually has her appointment with [employer] and [place] [facility] tomorrow to hopefully be measured for obtaining and AFO splint. I think this would be helpful preventing this from reoccurring. We had contemplated starting the cast this week although to be honest I am reluctant to do that as she's been having nausea, vomiting, and seizure activity over the past three days. She has [name] history of seizures and have been told is nothing that can be done for these. With that being said I do believe that along with the seizures have the nausea vomiting which upon further questioning doesn't seem to be the normal for her and makes me concerned for the possibility of infection or something else going on. I discussed this with the patient and her mother during the office visit today. I do not feel the wound is effective  but maybe something else. The responses this was "this just happens to her at times and we don't know why". They did not seem to be interested in going to the hospital to have this checked out further. 10/09/18 on evaluation today patient presents for follow-up concerning her ongoing toe ulcer. She has been tolerating the dressing changes without complication. Fortunately there does not appear to be any evidence of infection which is great news however I do think that the patient would benefit from going ahead for with the total contact cast. She's actually in [name] wheelchair today she tells me that she will use her walker if we initiate the cast. I was very specific about the fact that if we were gonna do the cast I wanted to make sure that she was using the walker in order to prevent any falls. She tells me she does not have stairs that she has to traverse on [name] regular basis at her home. She has not had any seizures since last week again that something that happens to her often she tells me she did talk to [employer] and they said that it may take up to three weeks to get the brace approved for her. Hopefully that will not take that long but nonetheless in the meantime I do think the cast could be of benefit. 10/12/18 on evaluation today patient appears to be doing rather well in regard to her toe ulcer. It's just been [name] few days and already this is significantly improved both as far as overall appearance and size. Fortunately there's no sign of infection. She is here for her first obligatory cast change. 10/19/18 Seen today for follow up and management of left great toe ulcer. Wound continues to show improvement. Noted small open area with seroussang drainage with palpation. Denies any increased pain or recent fevers during visit. She will continue calcium alginate with offloading shoe. Denies any questions or concerns during visit. 10/26/18 on evaluation today patient appears to be doing about the same as  when I last saw her in regard to her wound bed. Fortunately there does not appear to be any signs of infection. Unfortunately she continues to have [name] breakdown in regard to the toe  region any time that she is not in the cast. It takes almost no time at all for this to happen. Nonetheless she still has not heard anything from the brace being made by [employer] as to when exactly this will be available to her. Fortunately there is no signs of infection at this time. 10/30/18 on evaluation today patient presents for application of the total contact cast as we just received him this morning. Fortunately we are gonna be able to apply this to her today which is great news. She continues to have no significant pain which is good news. Overall I do feel like things have been improving while she was the cast is when she doesn't have [name] cast that things get worse. She still has not really heard anything from [employer] regarding her brace. 11/02/18 upon evaluation today patient's wound already appears to be doing significantly better which is good news. Fortunately there does not appear to be any signs of infection also good news. Overall I do think the total contact cast as before is helping to heal this area unfortunately it's just not gonna likely keep the area closed and healed without her getting her brace at least. Again the foot drop is [name] significant issue for her. 11/09/18 on evaluation today patient appears to be doing excellent in regard to her toe ulcer which in fact is completely healed. Fortunately we finally got the situation squared away with the paperwork which was needed to proceed with getting her brace approved by Medicaid. I have filled that out unfortunately that information has been sent to the orthopedic office that I worked at 2 1/2 years ago and not tired Current wound care measures. Fortunately she seems to be doing very well at this time. 11/23/18 on evaluation today patient appears to be doing  More Poorly Compared to Last Time I Saw Her. At [facility] She Had Completely Healed. Currently she is continuing to have issues with reopening. She states that she just found out that the brace was approved through Medicaid now she just has to go get measured in order to have this fitted for her and then made. Subsequently she does not have an appointment for this yet that is going to complicate things we obviously cannot put her back in the cast if we do not have everything measured because they're not gonna be able to measure her foot while she is in the cast. 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 Obviously this is unfortunate and does have me somewhat worried as well. 11/30/18 on evaluation today patient's toe ulcer actually appears to be doing fairly well. The good news is she will be getting her brace in the shoes next week on Wednesday. Hopefully we will be able to get this to heal without having to go back in the cast however she may need the cast in order to get the wound completely heal and then go from there. Fortunately there's no signs of infection at this time. 12/07/18 on evaluation today patient fortunately did receive her brace and she states she could tell this definitely makes her walk better. With that being said she's been having issues with her toe where she noticed yesterday there was [name] lot of tissue that was loosing off this appears to be much larger than what it was previous. She also states that her leg has been read  putting much across the top of her foot just about the ankle although this seems to be receiving somewhat. The total area is still red and appears to be someone infected as best I can tell. She is previously taken Bactrim and that may be [name] good option for her today as well. We are gonna see what I wound culture shows as well and I think that this is definitely  appropriate. With that being said outside of the culture I still need to initiate something in the interim and that's what I'm gonna go ahead and select Bactrim is [name] good option for her. 12/14/18 on evaluation today patient appears to be doing better in regard to her left great toe ulcer as compared to last week's evaluation. There's still some erythema although this is significantly improved which is excellent news. Overall I do believe that she is making good progress is still gonna take some time before she is where I would like her to be from the standpoint of being able to place her back into the total contact cast. Hopefully we will be where we need to be by next week. 12/21/18 on evaluation today patient actually appears to be doing poorly in regard to her toe ulcer. She's been tolerating the dressing changes without complication. Fortunately there's no signs of systemic infection although she does have [name] lot of drainage from the toe ulcer and this does seem to be causing some issues at this point. She does have erythema on the distal portion of her toe that appears to be likely cellulitis. [name], [name] ([id number]) [id number] Page 4 of 20 12/28/18 on evaluation today patient actually appears to be doing [name] little better in my pinion in regard to her toe ulcer. With that being said she still does have some evidence of infection at this time and for her culture she had both E. coli as well as enterococcus as organisms noted on evaluation. For that reason I think that though the Keflex likely has treated the E. coli rather well this has really done nothing for the enterococcus. We are going to have to initiate treatment for this specifically. 01/04/19 on evaluation today patient's toe actually appears to be doing better from the standpoint of infection. She currently would like to see about putting the cash back on I think that this is appropriate as long as she takes  care of it and keeps it from getting wet. She is gonna have some drainage we can definitely pass this up with Drawtex and alginate to try to prevent as much drainage as possible from causing the problems. With that being said I do want to at least try her with the cast between now and Tuesday. If there any issues we can't continue to use it then I will discontinue the use of the cast at that point. 01/08/19 on evaluation today patient actually appears to be doing very well as far as her foot ulcer specifically the great toe on the left is concerned. She did have an area of rubbing on the medial aspect of her left ankle which again is from the cast. Fortunately there's no signs of infection at this point in this appears to be [name] very slight skin breakdown. The patient tells me she felt it rubbing but didn't think it was that bad. Fortunately there is no signs of active infection at this time which is good news. No fevers, chills, nausea, or vomiting noted at this time. 01/15/19 on evaluation today  patient actually appears to be doing well in regard to her toe ulcer. Again as previous she seems to do well and she has the cast on which indicates to me that during the time she doesn't have [name] cast on she's putting way too much pressure on this region. Obviously I think that's gonna be an issue as with the current national emergency concerning the Covid-19 Virus it has been recommended that we discontinue the use of total contact casting by the chief medical officer of our company, Dr. Maurine Minister. The reasoning is that if [name] patient becomes sick and cannot come into have the cast removed they could not just leave this on for an additional two weeks. Obviously the hospitals also do not want to receive patient's who are sick into the emergency department to potentially contaminate the region and spread the Covid-19 Virus among other sick individuals within the hospital system. Therefore at this point we are suspending  the use of total contact cast until the current emergency subsides. This was all discussed with the patient today as well. 01/22/19 on evaluation today patient's wound on her left great toe appears to be doing slightly worse than previously noted last week. She tells me that she has been on this quite [name] bit in fact she tells me she's been awake for 38 straight hours. This is due to the fact that she's having to care for grandparents because nobody else will. She has been taking care of them for five the last seven days since I've seen her they both have dementia his is from [name] stroke and her grandmother's was progressive. Nonetheless she states even her mom who knows her condition and situation has only help two of those days to take care of them she's been taking care of the rest. Fortunately there does not appear to be any signs of active infection in regard to her toe at this point although obviously it doesn't look as good as it did previous. I think this is directly related to her not taking off the pressure and friction by way of taking things easy. Though I completely understand what's going on. 01/29/19 on evaluation today patient's tools are actually appears to be showing some signs of improvement today compared to last week's evaluation as far as not necessarily the overall size of the wound but the fact that she has some new skin growth in between the two ends of the wound opening. Overall I feel like she has done well she states that she had [name] family member give her what sounds to be [name] CAM walker boot which has been helpful as well. 02/05/19 on evaluation today patient's wound bed actually appears to be doing significantly better in regard to her overall appearance of the size of the wound. With that being said she is still having an issue with offloading efficiently enough to get this to close. Apparently there is some signs of infection at this point as well unfortunately. Previously she's done  well of Augmentin I really do not see anything that needs to be culture currently but there theme and cellulitis of the foot that I'm seeing I'm gonna go ahead and place her on an antibiotic today to try to help clear this up. 02/12/2019 on evaluation today patient actually appears to be doing poorly in regard to her overall wound status. She tells me she has been using her offloading shoe but actually comes in today wearing her tennis shoe with the AFO brace. Again  as I previously discussed with her this is really not sufficient to allow the area to heal appropriately. Nonetheless she continues to be somewhat noncompliant and I do wonder based on what she has told my nurse in the past as to whether or not [name] good portion of this noncompliance may be recreational drug and alcohol related. She has had [name] history of heroin overdose and this was fairly recently in the past couple of months that have been seeing her. Nonetheless overall I feel like her wound looks significantly worse today compared to what it was previous. She still has significant erythema despite the Augmentin I am not sure that this is an appropriate medication for her infection I am also concerned that the infection may have gone down into her bone. 02/19/19 on evaluation today patient actually appears to be doing about the same in regard to her toe ulcer. Unfortunately she continues to show signs of bone exposure and infection at this point. There does not appear to be any evidence of worsening of the infection but I'm also not really sure that it's getting significantly better. She is on the Augmentin which should be sufficient for the Staphylococcus aureus infection that she has at this point. With that being said she may need IV antibiotics to more appropriately treat this. We did have [name] discussion today about hyperbaric option therapy. 02/28/19 on evaluation today patient actually appears to be doing much worse in regard to the wound on  her left great toe as compared to even my previous evaluation last week. Unfortunately this seems to be training in [name] pretty poor direction. Her toe was actually now starting to angle laterally and I can actually see the entire joint area of the proximal portion of the digit where is the distal portion of the digit again is no longer even in contact with the joint line. Unfortunately there's [name] lot more necrotic tissue around the edge and the toe appears to be showing signs of becoming gangrenous in my pinion. I'm very concerned about were things stand at this point. She did see infectious disease and they are planning to send in [name] prescription for Sivextro for her and apparently this has been approved. With that being said I don't think she should avoid taking this but at the same time I'm not sure that it's gonna be sufficient to save her toe at this point. She tells me that she still having to care for grandparents which I think is putting quite [name] bit of strain on her foot and specifically the total area and has caused this to break down even to [name] greater degree than would've otherwise been expected. 03/05/19 on evaluation today patient actually appears to be doing quite well in regard to her toe all things considering. She still has bone exposed but there appears to be much less your thing on overall the appearance of the wound and the toe itself is dramatically improved. She still does have some issues currently obviously with infection she did see vascular as well and there concerned that her blood flow to the toad. For that reason they are setting up for an angiogram next week. 03/14/19 on evaluation today patient appears to be doing very poor in regard to her toe and specifically in regard to the ulceration and the fact that she's starting to notice the toe was leaning even more towards the lateral aspect and the complete joint is visible on the proximal aspect of the joint. Nonetheless she's  also  noted [name] significant odor and the tip of the toe is turning more dark and necrotic appearing. Overall I think she is getting worse not better as far as this is concerned. For that reason I am recommending at this point that she likely needs to be seen for likely amputation. READMISSION 03/19/2021 This is [name] patient that we cared for in this clinic for [name] prolonged period of time in 2019 and 2020 with [name] left foot and left first toe wound. 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 Excell Seltzer. In December 2021 she had an ulcer on her right great toe as well as the fourth and fifth toes. 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 She also had an angiogram at that time and underwent angioplasty of the right anterior tibial artery. In any case she claims that the wound on the right foot is closed I did not look at this today which was probably an oversight although I think that should be done next week. 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 According to Dr. Bernette Redbird last review that she was out of the area being cared for by another physician but recently came back to his attention. 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 [name] culture of this area showed E. coli apparently before she came back to see Dr. Excell Seltzer she was supposed to be receiving antibiotics but she did not really take them. Nor is she offloading this area at all. Finally her last hemoglobin A1c listed in epic was in March 2022 at 14.1 she says things are [name] lot better since then although I am not sure. 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 She had this wound at the time. During this admission she had E. coli sepsis [name] TEE was negative. Past medical  history is extensive and includes type 2 diabetes with peripheral neuropathy cardiomyopathy with an ejection fraction of 33%, hypertension, hyperlipidemia chronic renal failure stage III history of substance abuse with cocaine although she claims to be clean now verified by her mother. She is still [name] heavy cigarette smoker. She has [name] history of bipolar disorder seizure disorder [name], [name] ([id number]) [id number] Page 5 of 20 ABI in our clinic was 1.05 6/1; left midfoot in the setting of [name] TMA done previously. Round circular wound with [name] "knuckle" of protruding tissue. The problem is that the knuckle was not attached to any of the surrounding granulation and this probed proximally widely I removed [name] large portion of this tissue. This wound goes with considerable undermining laterally. I do not feel any bone there was no purulence but this is [name] deep wound. 6/8; in spite of the debridement I did last week. She arrives with [name] wound looking exactly the same. [name] protruding "knuckle" of tissue nonadherent to most of the surrounding tissue. There is considerable depth around this from 6-12 o'clock at 2.7 cm and undermining of 1 cm. This does not look overtly infected and the x- ray I did last week was negative for any osseous abnormalities. We have been using silver collagen 6/15; deep tissue culture I did last week showed moderate staph aureus and moderate Pseudomonas. This will definitely require prolonged antibiotic therapy. The pathology on  the protuberant area was negative for malignancy fungus etc. 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 We have been using silver collagen. I am going to be prescribing Levaquin for 2 weeks. Her CT scan of the foot is down for 7/5 6/22; CT scan of the foot on 7 5. She says she has hardware in the left leg from her previous fracture. She is on the Levaquin for the deep tissue  culture I did that showed methicillin sensitive staph aureus and Pseudomonas. I gave her [name] 2-week supply and she will have another week. She arrives in clinic today with the same protuberant tissue however this is nonadherent to the tissue surrounding it. I am really at [name] loss to explain this unless there is underlying deep tissue infection 6/29; patient presents for 1 week follow-up. She has been using collagen to the wound bed. She reports taking her antibiotics as prescribed.She has no complaints or issues today. She denies signs of infection. 7/6; patient presents for one week followup. She has been using collagen to the wound bed. She states she is taking Levaquin however at times she is not able to keep it down. She denies signs of infection. 7/13; patient presents for 1 week follow-up. She has been using silver alginate to the wound bed. She still has nausea when taking her antibiotics. She denies signs of infection. 7/20; patient presents for 1 week follow-up. She has been using silver alginate with gentamicin cream to the wound bed. She denies any issues and has no complaints today. She denies signs of infection. 7/27; patient presents for 1 week follow-up. She continues to use silver alginate with gentamicin cream to the wound bed. She reports starting her antibiotics. She has no issues or complaints. Overall she reports stability to the wound. 8/3; patient presents for 1 week follow-up. She has been using silver alginate with gentamicin cream to the wound bed. She reports completing all antibiotics. She has no issues or complaints today. She denies signs of infection. 8/17; patient presents for 2-week follow-up. He is to use silver alginate to the wound bed. She has no issues or complaints today. She denies signs of infection. She reports her pain has improved in her foot since last clinic visit 8/24; patient presents for 1 week follow-up. She continues to use silver alginate to the  wound bed. She has no issues or complaints. She denies signs of infection. Pain is stable. 9/7; patient presents for follow-up. She missed her last week appointment due to feeling ill. She continues to use silver alginate. She has [name] new wound to the right lower extremity that is covered in eschar. She states It occurred over the past week and has no idea how it started. She currently denies signs of infection. 9/14; patient presents for follow-up. T the left foot wound she has been using gentamicin cream and silver alginate. T the right lower extremity wound she has o o been keeping this covered and has not obtain Santyl. 9/21; patient presents for follow-up. She reports using gentamicin cream and silver alginate to the left foot and Santyl to the right lower extremity wound. She has no issues or complaints today. She denies signs of infection. 9/28; patient presents for follow-up. She reports [name] new wound to her right heel. She states this occurred [name] few days ago and is progressively gotten worse. She has been trying to clean the area with [name] Q-tip and Santyl. She reports stability in the  other 2 wounds. She has been using gentamicin cream and silver alginate to the left foot and Santyl to the right lower extremity wound. 10/12; patient presents for follow-up. She reports improvement to the wound beds. She is seeing vein and vascular to discuss the potential of [name] left BKA. She states they are going to do an arteriogram. She continues to use silver alginate with dressing changes to her wounds. 11/2; patient presents for follow-up. She states she has not been doing dressing changes to the wound beds. She states she is not able to offload the areas. She reports chronic pain to her left foot wound. 11/9; patient presents for follow-up. She came in with only socks on. She states she forgot to put on shoes. It is unclear if she is doing any dressing changes. She currently denies systemic signs of  infection. 11/16; patient presents for follow-up. She came again only with socks on. She states she does not wear shoes ever. It is unclear if she does dressing changes. She currently denies systemic signs of infection. 11/23; patient presents for follow-up. She wore her shoes today. It still unclear exactly what dressing she is using for each wound but she did states she obtained Dakin's solution and has been using this to the left foot wound. She currently denies signs of infection. 11/30; patient presents for follow-up. She has no issues or complaints today. She currently denies signs of infection. 12/7; patient presents for follow-up. She has no issues or complaints today. She has been using Hydrofera Blue to the right heel wound and Dakin solution to the left foot wound. Her right anterior leg wound is healed. She currently denies signs of infection. 12/14; patient presents for follow-up. She has been using Hydrofera Blue to the right heel and Dakin's to the left foot wounds. She has no issues or complaints today. She denies signs of infection. 12/21; patient presents for follow-up. She reports using Hydrofera Blue to the right heel and Dakin's to the left foot wound. She denies signs of infection. 12/28; patient presents for follow-up. She continues to use Dakin's to the left foot wound and Hydrofera Blue to the right heel wound. She denies signs of infection. 1/4; patient presents for follow-up. She has no issues or complaints today. She denies signs of infection. 1/11; patient presents for follow-up. It is unclear if she has been dressing these wounds over the past week. She currently denies signs of infection. 1/18; patient presents for follow-up. She states she has been using Dakin's wet-to-dry dressings to the left foot. She has been using Hydrofera Blue to the right foot foot wound. She states that the anterior right leg wound has reopened and draining serous fluid. She denies signs of  infection. 1/25; patient presents for follow-up. She has no issues or complaints today. 2/1; patient presents for follow-up. She has no issues or complaints today. She denies signs of infection. 2/8; patient presents for follow-up. She has lost her surgical shoes. She did not have [name] dressing to the right heel wound. She currently denies signs of infection. 2/15; patient presents for follow-up. She reports more pain to the right heel today. She denies purulent drainage Or fever/chills 2/22; patient presents for follow-up. She reports taking clindamycin over the past week. She states that she continues to have pain to her right heel. She reports purulent drainage. Readmission 03/16/2022 LADDIE, NAEEM [name] ([id number]) [id number] Page 6 of 20 Ms. Jacobi Ryant is [name] 47 year old female with [name] past medical history of type  2 diabetes, osteomyelitis to her feet, chronic systolic heart failure and bipolar disorder that presents to the clinic for bilateral feet wounds and right lower extremity wound. She was last seen in our clinic on 12/15/2021. At that time she had purulent drainage coming out of her right plantar foot and I recommended she go to the ED. She states she went to [facility] and has been there for the past 3 months. I cannot see the records. She states she had OR debridement and was on several weeks of IV antibiotics while inpatient. Since discharge she has not been taking care of the wound beds. She had nothing on her feet other than socks today. She currently denies signs of infection. 5/31; patient presents for follow-up. She has been using Dakin's wet-to-dry dressings to the wound beds on her feet bilaterally and antibiotic ointment to the right anterior leg wound. She had [name] wound culture done at last clinic visit that showed moderate Pseudomonas aeruginosa sensitive to ciprofloxacin. She currently denies systemic signs of infection. 6/14; patient  presents for follow-up. She received Keystone 5 days ago and has been using this on the wound beds. She states that last week she had to go to the hospital because she had increased warmth and erythema to the right foot. She was started on 2 oral antibiotics. She states she has been taking these. She currently denies systemic signs of infection. She has no issues or complaints today. 6/21; patient presents for follow-up. She states she has been using Keystone antibiotics to the wound beds. She has no issues or complaints today. She denies signs of infection. 6/28; patient presents for follow-up. She has been using Keystone antibiotics to the wound beds. She has no issues or complaints today. 7/12; patient presents for follow-up. Has been using Keystone antibiotics to the wound beds with calcium alginate. She has no issues or complaints today. She never followed up with her orthopedic surgeon who did the OR debridement to the right foot. We discussed the total contact cast for the left foot and patient would like to do this next week. 7/19; patient presents for follow-up. She has been using Keystone antibiotics with calcium alginate to the wound beds. She has no issues or complaints today. Patient is in agreement to do the total contact cast of the left foot today. She knows to return later this week for the obligatory cast change. 05-13-2022 upon evaluation today patient's wound which she has the cast of the left leg actually appears to be doing significantly better. Fortunately I do not see any signs of active infection locally or systemically which is great news and overall I am extremely pleased with where we stand currently. 7/26; patient presents for follow-up. She has [name] cast in place for the past week. She states it irritated her shin. Other than that she tolerated the cast well. She states she would like [name] break for 1 week from the cast. We have been using Keystone antibiotic and Aquacel to both  wound beds. She denies signs of infection. 8/2; patient presents for follow-up. She has been using Keystone and Aquacel to the wound beds. She denies any issues and has no complaints. She is agreeable to have the cast placed today for the left leg. 06-03-2022 upon evaluation today patient appears to be doing well with regard to her wound she saw some signs of improvement which is great news. Fortunately I do not see any evidence of active infection locally or systemically at this  time which is great news. No fevers, chills, nausea, vomiting, or diarrhea. 8/16; patient presents for follow-up. She has no issues or complaints today. We have been using Keystone and Aquacel to the wound beds. The left lower extremity is in [name] total contact cast. She is tolerated this well. 8/23; patient presents for follow-up. She has had the total contact cast on the left leg for the past week. Unfortunately this has rubbed and broken down the skin to the medial foot. She currently denies signs of infection. She has been using Keystone antibiotic to the right plantar foot wound. 8/30; patient presents for follow-up. We have held off on the total contact cast for the left leg for the past week. Her wound on the left foot has improved and the previous surrounding breakdown of skin has epithelialized. She has been using Keystone antibiotic to both wound beds. She has no issues or complaints today. She denies signs of infection. 9/6; patient presents for follow-up. She has ordered her's Keystone antibiotic and this is arriving this week. She has been doing Dakin's wet-to-dry dressings to the wound beds. She denies signs of infection. She is agreeable to the total contact cast today. 9/13; patient presents for follow-up. She states that the cast caused her left leg shin to be sore. She would like to take [name] break from the cast this week. She has been using Keystone antibiotic to the right plantar foot wound. She denies signs of  infection. 9/20; patient presents for follow-up. She has been using Keystone antibiotics to the wound beds with calcium alginate to the right foot wound and Hydrofera Blue to the left foot wound. She is agreeable to having the cast placed today. She has been approved for Apligraf and we will order this for next clinic visit. 9/27; patient presents for follow-up. We have been using Keystone antibiotic with Hydrofera Blue to the left foot wound under [name] total contact cast. T the right o foot wound she has been using Keystone antibiotic and calcium alginate. She declines [name] total contact cast today. Apligraf is available for placement and she would like to proceed with this. 07-28-2022 upon evaluation today patient appears to be doing well currently in regard to her wound. She is actually showing signs of significant improvement which is great news. Fortunately I do not see any evidence of active infection locally nor systemically at this time. She has been seeing Dr. Mikey Bussing and to be honest has been doing very well with the cast. Subsequently she comes in today with [name] cast on and we did reapply that today as well. She did not really want to she try to talk me out of that but I explained that if she wanted to heal this is really the right way to go. Patient voiced understanding. In regard to her right foot this is actually [name] lot better compared to the last time I saw her which is also great news. 10/11; patient presents for follow-up. Apligraf and the total contact cast was placed to the left leg at last clinic visit. She states that her right foot wound had burning pain to it with the placement of Apligraf to this area. She has been doing [place] over this area. She denies signs of infection including increased warmth, erythema or purulent drainage. 11/1; 3-week follow-up. The patient fortunately did not have [name] total contact cast or an Apligraf and on the left foot. She has been using Keystone ABD  pads and kerlix and her own running  shoes She arrives in clinic today with thick callus and [name] very poor surface on the left foot on the right nonviable skin subcutaneous tissue and [name] deep probing hole. 11/15; patient missed her last clinic appointment. She states she has not been dressing the wound beds for the past 2 weeks. She states that at she had [name] new roommate but is now going back to live with her mother. Apparently its been [name] distracting 2 weeks. Patient currently denies signs of infection. 11/22; patient presents for follow-up. She states she has been using Keystone antibiotic and Dakin's wet-to-dry dressings to the wound beds. She is agreeable for cast placement today. We had ordered Apligraf however this has not been received by our facility. 11/29; Patient had [name] total contact cast placed at last clinic visit and she tolerated this well. We were using silver alginate under the cast. Patient's been using Keystone antibiotic with Aquacel to the right plantar foot wound. She has no issues or complaints today. Apligraf is available for placement today. Patient would like to proceed with this. 12/6; patient presents for follow-up. She had Apligraf placed in standard fashion last clinic visit under the total contact cast to the left lower extremity. She has been using Keystone antibiotic and Aquacel to the right plantar foot wound. She has no issues or complaints today. 12/13; patient presents for follow-up. She has finished 5 Apligraf placements. Was told she would not qualify for more. We have been doing [name] total contact [place], Shital [name] ([id number]) [id number] Page 7 of 20 cast to the left lower extremity. She has been using Keystone antibiotic and Aquacel to the right plantar foot wound. She has no issues or complaints today. 12/20; patient presents for follow-up. We have been using Hydrofera Blue with Keystone antibiotic under [name] total contact cast of the left lower  extremity. She reports using Keystone antibiotic and silver alginate to the right heel wound. She has no issues or complaints today. 12/27; patient presents with [name] healthy wound on the left midfoot. We have Apligraf to apply that to that more also using [name] total contact cast. On the right we are using Keystone and silver alginate. She is offloading the right heel with [name] surgical shoealthough by her admission she is on her feet quite [name] bit 1/3; patient presents for follow-up. Apligraf was placed to the wound beds last clinic visit. She was placed in [name] total contact cast to the left lower extremity. She declines [name] total contact cast today. She states that her mother is in the hospital and she cannot adequately get around with the cast on. 1/10; patient presents for follow-up. She declined the total contact cast at last clinic visit. Both wounds have declined in appearance. She states that she has been on her feet and not offloading the wound beds. She currently denies signs of infection. 1/17; patient presents for follow-up. She had the total contact cast along with Apligraf placed last week to the left lower extremity. She tolerated this well. She has been using Aquacel Ag and Keystone antibiotic to the right heel wound. She currently denies signs of infection. She has no issues or complaints today. 1/24; patient presents for follow-up. We have been using Apligraf to the left foot wound along with [name] total contact cast. She has done well with this. T the right o heel wound she has been using Aquacel Ag and Keystone antibiotic ointment. She has no issues or complaints today. She denies signs of infection. 1/31; patient presents  for follow-up. We have been using Apligraf to the left foot wound along with the total contact cast. She continues to do well with this. To the right heel we have been using Aquacel Ag and Keystone antibiotic ointment. She has no issues or complaints today. 12-01-2022 upon evaluation  patient is seen today on my schedule due to the fact that she unfortunately was in the hospital yesterday. Her cast needed to come off the only reason she is out of the hospital is due to the fact that they would not take it off at the hospital which is somewhat bewildered reading to me to be perfectly honest. I am not certain why this was but either way she was released and then was placed on my schedule today in order to get this off and reapply the total contact casting as appropriate. I do not have an Apligraf for her today it was applied last week and today's actually expired yesterday as there was some scheduling conflicts with her being in the hospital. Nonetheless we do not have that for reapplication today but the good news that she is not draining too much and the Apligraf can go for up to 2 weeks so I am going to go ahead and reapply the total contact casting but we are going to leave the Apligraf in place. 2/14; patient presents for follow-up. T the left leg she has had the total contact cast and Apligraf for the past week. She has had no issues with this. T the o o right heel she has been using antibiotic ointment and Aquacel Ag. 2/21; patient presents for follow-up. We have been using Apligraf and [name] total contact cast to the left lower extremity. She is tolerated this well. Unfortunately she is not approved for any more Apligraf per insurance. She has been using antibiotic ointment and Aquacel Ag to the right foot. She has no issues or complaints today. 2/28; patient presents for follow-up. We have been using Hydrofera Blue and antibiotic ointment under the total contact cast to the left lower extremity. He has been using Aquacel Ag with antibiotic ointment to the right plantar foot. She has no issues or complaints today. 3/6; patient presents for follow-up. She did not obtain her gentamicin ointment. She has been using Aquacel Ag to the right plantar foot wound. We have been using  Hydrofera Blue with antibiotic ointment under the total contact cast to the left lower extremity. She has no issues or complaints today. 3/12; patient presents for follow-up. She has been using gentamicin ointment and Aquacel Ag to the right plantar foot wound. We have been using Hydrofera Blue with antibiotic ointment under the total contact cast on the left lower extremity foot wound. She has no issues or complaints today. 3/20; patient presents for follow-up. She has been using gentamicin ointment and Aquacel Ag to the right plantar foot wound. We have been using Hydrofera Blue with antibiotic ointment under the total contact cast to the left lower extremity wound. She has no issues or complaints today. 3/27; patient presents for follow-up. We have been using antibiotic ointment and Aquacel Ag to the right plantar foot wound. We have been using Hydrofera Blue with antibiotic ointment under the total contact cast to the left lower extremity. Both wounds are smaller. 01-24-2023 upon evaluation today patient actually appears to be doing better compared to last time I saw her. It has been several weeks since I last saw her but nonetheless I think the total contact casting is  doing [name] good job. Fortunately I do not see any evidence of infection or worsening overall which is great news and in general I do believe that she is moving in the appropriate direction. It has been [name] very long road but nonetheless I feel like she is nearing the end at least in regard to the left foot and even the right foot looks much better to be perfectly honest. 01-31-2023 upon evaluation today patient appears to be doing well currently in regard to her wounds. She has been require some sharp debridement which I think is probably bone both sides to remove [name] lot of callus that has built up at this point. I discussed that with her today. Also think total contact cast is doing well for the left foot Emina continue that as well. 4/17;  patient presents for follow-up. We have been doing Hydrofera Blue with antibiotic ointment under the total contact cast to the left lower extremity and Aquacel Ag with antibiotic ointment to the right foot wound. She has been using her offloading heel shoe here. 4/24; patient presents for follow-up. We have been doing Hydrofera Blue with antibiotic ointment under the total contact cast to the left lower extremity and Aquacel Ag with antibiotic ointment to the right foot wound. She has been using her offloading heel shoe here. Wounds are smaller with slough accumulation. 5/1; patient presents for follow-up. We have been doing Hydrofera Blue with antibiotic ointment under the total contact cast to the left lower extremity and Aquacel Ag with antibiotic ointment to the right foot wound. Left foot wound is stable and right foot wound appears smaller. 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 On the left she is using Hydrofera Blue topical antibiotic under [name] total contact cast, on the right plantar heel gentamicin Aquacel Ag. According to our intake nurse both are making slow but steady improvements. 5/15; patient presents for follow-up. We have been doing Hydrofera Blue with antibiotic ointment under the total contact cast on the left and antibiotic ointment with Aquacel Ag on the right. Unfortunately the left foot wound has declined in size and appearance. We also do not have the total contact cast available in office today. Patient denies signs of infection. 5/22; patient presents for follow-up. We have been doing Hydrofera Blue and antibiotic ointment to the left foot and Aquacel Ag with antibiotic ointment to the right foot. We took [name] break from the cast since we did not have it placed at last week. Wounds look stable if not slightly improved. 03-27-2023 patient presents today she has had this For Most 2 Weeks Because She Was in the Hospital and They Did Not T It off. Again That Is Really  Not ake Something That She Could Control but Nonetheless We Are Seeing Her at This Point Today for Reevaluation. We Do Want to Go and See about Reapply the Cast. Again We Had All Set Everything up to Go Ahead and Do This When We Went to Get the Cast Only to Realize That Not Had Come in and We Were out at the Moment. With That Being [place] We Will Get Have T Get the Exam before We Can Reapply the T Contact Casting Going Forward. o otal 6/12; patient presents for follow-up. She has been using Hydrofera Blue to the left foot wound and Aquacel Ag to the right foot wound. No cast was available last week placed on the patient so she has been offloading with her surgical shoe. She has no issues or  complaints today. 6/19; patient presents for follow-up. We have been using Hydrofera Blue to the left foot wound under the total contact cast and Aquacel Ag to the right wound. She has no issues or complaints today. The wounds are smaller. 6/26; patient presents for follow-up. We have been using Hydrofera Blue to the left foot wound under the total contact cast and Aquacel Ag to the right foot [place], Makyla [name] ([id number]) [id number] Page 8 of 20 wound. Wounds are smaller. Patient has no issues or complaints today. 7/3; patient presents for follow-up. We have been using Hydrofera Blue to the left foot wound under the total contact cast and Aquacel Ag to the right foot wound. Wounds Appear smaller. No signs of infection. 7/10; patient presents for follow-up. We have been using Hydrofera Blue to the left foot wound under the total contact cast and Aquacel Ag to the right foot wound. Left foot wound is smaller. Right foot wound is stable. 7/17; patient presents for follow-up. We have been using Hydrofera Blue to the left foot under the total contact cast and Aquacel Ag to the right foot wound. She has no issues or complaints today. 7/24; patient presents for follow-up. We have been using  Hydrofera Blue to the left foot under the total contact cast and Aquacel Ag to the right foot wound. Unfortunately she was bit by her dog over the weekend and developed [name] wound to the right lower leg. She has been keeping the area covered. 7/31; patient presents for follow-up. We have been using Hydrofera Blue to the left foot under the total contact cast and Aquacel Ag to the right foot wound and mupirocin ointment to the right anterior leg wound. She started her oral antibiotics 2 days ago. 8/7; patient presents for follow-up. We have been using Hydrofera Blue and antibiotic ointment to the left foot under the total contact cast and Aquacel Ag to the right foot wound. Antibiotic ointment to the right anterior leg wound. She completed her oral antibiotics. She has no issues or complaints today. Wounds continue to improve albeit slowly. 8/14; 8/14; patient has 3 wounds 1 on the left plantar heel 1 on the right plantar heel and [name] small wound on the right anterior lower leg. We have been using Hydrofera Blue in the left heel Aquacel on the right heel. The left heel has been in [name] total contact cast 8/21; patient presents for follow-up. We have been using Hydrofera Blue to the left foot wound under the total contact cast. T the right heel patient has been o using Aquacel Ag. T the anterior leg wound antibiotic ointment. All wounds have healed. o 8/28; patient presents for follow-up. She has been using her surgical shoe with offloading felt pads. Her wounds have remained closed. 9/11; patient presents for final follow-up to assure that her wounds have remained closed. Unfortunately the left foot wound has opened again. But she has been wearing her Surgical shoe for offloading with felt pad. 07-14-2023 upon evaluation today patient appears to be doing well currently in regard to her wound. She has been tolerating the dressing changes without complication with total contact casting. I am going to be helping  take care of her at this point as she is transferring from Dr. Mikey Bussing to me since Dr. Mikey Bussing is on maternity leave. 07-24-2023 upon evaluation today patient appears to be doing decently well currently in regard to her wound. She is actually showing signs of improvement in regard to the left foot and I  feel like this is much better than last week no debridement even necessary today. We may have to perform some slight debridement come next week we will see how things go. Monitor for any signs of worsening overall and obviously if anything changes she should be letting us know. 08-01-2023 upon evaluation today patient appears to be doing excellent in regard to her feet. The right foot/heel area is completely healed she does have some callus buildup I am going to trim this down [name] little bit I do not want to build up to the point of cracking she is doing well with the urea cream. With regard to the left heel she is doing well with casting I am going to perform some debridement here to clear away before reapplying the cast and I think she is really doing quite well though. 08-07-2023 upon evaluation patient's wound actually showed signs of excellent improvement and actually very pleased with where we stand I do believe that she is making good headway towards complete closure which is great news. 08-14-2023 upon evaluation today patient appears to be doing well currently in regard to her wound. She actually is going require [name] little bit of debridement but fortunately nothing too significant today actually feel like she is doing much better. This is mainly to remove callus on the little slough and biofilm down to good subcutaneous tissue. In general I think that she is making excellent headway here. 08-21-2023 upon evaluation today patient appears to be doing well currently in regard to her wound which is actually showing signs of significant improvement. I am extremely pleased with where things stand I do  not see any signs of infection at this time which is great news and overall I do think that we are making headway towards complete closure which is also excellent news. 08-30-2023 upon evaluation today patient's wound is showing signs of continued improvement. Fortunately I do not see any evidence of worsening overall and I believe that the patient is making excellent headway towards closure. I am extremely pleased with where we stand. Electronic Signature(s) Signed: 08/30/2023 3:56:54 PM By: [name] PA-C Entered By: [name] on 08/30/2023 12:56:54 -------------------------------------------------------------------------------- Physical Exam Details Patient Name: Date of Service: JAYLENA, HOLLOWAY 08/30/2023 10:00 [name]: [id number] Patient Account Number: [id number] Date of Birth/Sex: Treating RN: 03-08-1976 (47 y.o. [name] Primary Care Provider: [name] Other Clinician: [name] Referring Provider: Treating Provider/Extender: [name] in Treatment: [address] Naomi [name] ([id number]) [id number] Page 9 of 20 Constitutional Well-nourished and well-hydrated in no acute distress. Respiratory normal breathing without difficulty. Psychiatric this patient is able to make decisions and demonstrates good insight into disease process. Alert and Oriented x 3. pleasant and cooperative. Notes Upon inspection patient's wound bed actually showed signs of good granulation epithelization at this point. Fortunately I see no signs of active infection and I do believe that the patient is making excellent headway here towards closure which is awesome news. Electronic Signature(s) Signed: 08/30/2023 3:57:09 PM By: [name] PA-C Entered By: [name] on 08/30/2023 12:57:09 -------------------------------------------------------------------------------- Physician Orders Details Patient Name: Date of  Service: Heather Jefferson [name]. 08/30/2023 10:00 [name]: [id number] Patient Account Number: [id number] Date of Birth/Sex: Treating RN: October 01, 1976 (47 y.o. 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 Bathing/ Shower/ Hygiene May shower; gently cleanse wound with antibacterial soap, rinse and pat dry prior to dressing wounds No tub bath. Off-Loading Total Contact Cast to Left Lower Extremity Open toe surgical shoe - right foot Wound Treatment Wound #12R - Foot Wound Laterality: Plantar, Left, Medial Cleanser: Vashe 5.8 (oz) Discharge Instructions: Use vashe 5.8 (oz) as directed Prim Dressing: Promogran Matrix 4.34 (in) NIVEAH, BOERNER [name] ([id number]) [id number] Page 10 of 20 Discharge Instructions: Moisten w/normal saline or sterile water; Cover wound as directed. Do not remove from wound bed. Secondary Dressing: ABD Pad 5x9 (in/in) Discharge Instructions: Cover with ABD pad Electronic Signature(s) Signed: 08/30/2023 5:39:21 PM By: [name] Signed: 08/31/2023 11:25:01 AM By: [name] PA-C Entered By: [name] on 08/30/2023 08:12:59 -------------------------------------------------------------------------------- Problem List  Details Patient Name: Date of Service: Heather Jefferson [name]. 08/30/2023 10:00 [name]: [id number] Patient Account Number: [id number] Date of Birth/Sex: Treating RN: 1976/02/20 (47 y.o. 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 03/16/2022 No Yes severity E11.42 Type 2 diabetes mellitus with diabetic polyneuropathy 03/16/2022 No Yes Z79.4 Long term (current) use of insulin 02/22/2023 No Yes N18.6 End stage renal disease 07/14/2023 No Yes Z99.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 07/14/2023 No Yes L84 Corns and callosities 08/01/2023 No Yes Inactive Problems Resolved Problems ICD-10 AVIAH, SORCI [name] ([id number]) [id number] Page 11 of 20 Code Description Active Date Resolved Date L97.512 Non-pressure chronic ulcer of other part of right foot with fat layer exposed 03/16/2022 03/16/2022 L97.811 Non-pressure chronic ulcer of other part of right lower leg limited to breakdown of skin 03/16/2022 03/16/2022 W54.0XXA Bitten by dog, initial encounter 05/17/2023 05/17/2023 S81.801A Unspecified open wound, right lower leg, initial encounter 05/17/2023 05/17/2023 K44.010 Type 2 diabetes mellitus with other skin ulcer 05/17/2023 05/17/2023 Electronic Signature(s) Signed: 08/30/2023 10:09:03 AM By: [name] PA-C Entered By: [name] on 08/30/2023 07:09:03 -------------------------------------------------------------------------------- Progress Note Details Patient Name: Date of Service: Heather Jefferson [name]. 08/30/2023 10:00 [name]: [id number] Patient Account Number: [id number] Date of  Birth/Sex: Treating RN: [birth year] (47 y.o. [name] Primary Care Provider: [name] Other Clinician: [name] Referring Provider: Treating Provider/Extender: [name] in Treatment: 18 Subjective Chief Complaint Information obtained from Patient Left foot ulcer History of Present Illness (HPI) 01/18/18-She is here for initial evaluation of the left great toe ulcer. She is [name] poor historian in regards to timeframe in detail. She states approximately 4 weeks ago she lacerated her toe on something in the house. She followed up with her primary care who placed her on Bactrim and ultimately [name] second dose of Bactrim prior to coming to wound clinic. She states she has been treating the toe with peroxide, Betadine and [name] Band-Aid. She did not check  her blood sugar this morning but checked it yesterday morning it was 327; she is unaware of [name] recent A1c and there are no current records. She saw Dr. she would've orthopedics last week for an old injury to the left ankle, she states he did not see her toe, nor did she bring it to his attention. She smokes approximately 1 pack cigarettes [name] day. Her social situation is concerning, she arrives this morning with her mother who appears extremely intoxicated/under the influence; her mother was asked to leave the room and be monitored by the patient's grandmother. The patient's aunt then accompanied the patient and the room throughout the rest of the appointment. We had [name] lengthy discussion regarding the deleterious effects of uncontrolled hyperglycemia and smoking as it relates to wound healing and overall health. She was strongly encouraged to decrease her smoking and get her diabetes under better control. She states she is currently on [name] diet and has cut down her [employer] consumption. The left toe is erythematous, macerated and slightly edematous with malodor present. The edema in her left foot is below her baseline,  there is no erythema streaking. We will treat her with Santyl, doxycycline; we have ordered and xray, culture and provided [name] Peg assist surgical shoe and cultured the wound. 01/25/18-She is here in follow-up evaluation for [name] left great toe ulcer and presents with an abscess to her suprapubic area. She states her blood sugars remain elevated, feeling "sick" and if levels are below 250, but she is trying. She has made no attempt to decrease her smoking stating that we "can't take away her food in her cigarettes". She has been compliant with offloading using the PEG assist you. She is using Santyl daily. the culture obtained last week grew staph aureus and Enterococcus faecalis; continues on the doxycycline and Augmentin was added on Monday. The suprapubic area has erythema, no femoral variation, purple discoloration, minimal induration, was accessed with [name] cotton tip applicator with sanguinopurulent drainage, this was cultured, I suspect the current antibiotic treatment will cover and we will not add anything to her current treatment plan. She was advised to go to urgent care or ER with any change in redness, induration or fever. 02/01/18-She is here in follow-up evaluation for left great toe ulcers and [name] new abdominal abscess from last week. She was able to use packing until earlier this week, where she "forgot it was there". She states she was feeling ill with GI symptoms last week and was not taking her antibiotic. She states her glucose levels have been predominantly less than 200, with occasional levels between 200-250. She thinks this was contributing to her GI symptoms as they have resolved without intervention. There continues to be significant laceration to left toe, otherwise it clinically looks stable/improved. There is now less superficial opening to the lateral aspect of the great toe that was residual blister. We will transition to [facility] to all wounds, she will continue her Augmentin.  If there is no change or deterioration next week for reculture. 02/08/18-She is here in follow-up evaluation for left great toe ulcer and abdominal ulcer. There is an improvement in both wounds. She has been wrapping her left toe with coban, not by our direction, which has created an area of discoloration to the medial aspect; she has been advised to NOT use coban secondary to her neuropathy. She states her glucose levels have been high over this last week ranging from 200-350, she continues to smoke. She admits to  being less MALKA, BOCEK [name] ([id number]) [id number] Page 12 of 20 compliant with her offloading shoe. We will continue with same treatment plan and she will follow-up next week. 02/15/18-She is here in follow-up evaluation for left great toe ulcer and abdominal ulcer. The abdominal ulcer is epithelialized. The left great toe ulcer is improved and all injury from last week using the Coban wrap is resolved, the lateral ulcer is healed. She admits to noncompliance with wearing offloading shoe and admits to glucose levels being greater than 300 most of the week. She continues to smoke and expresses no desire to quit. There is one area medially that probes deeper than it has historically, erythema to the toe and dorsal foot has consistently waxed and waned. There is no overt signs of cellulitis or infection but we will culture the wound for any occult infection given the new area of depth and erythema. We will hold off on sensitivities for initiation of antibiotic therapy. 02/22/18-She is here in follow up evaluation for left great toe ulcer. There is overall significant improvement in both wound appearance, erythema and edema with changes made last week. She was not initiated on antibiotic therapy. Culture obtained last week showed oxacillin sensitive staph aureus, sensitive to clindamycin. Clindamycin has been called into the pharmacy but she has been instructed to hold  off on initiation secondary to overall clinical improvement and her history of antibiotic intolerance. She has been instructed to contact the clinic with any noted changes/deterioration and the wound, erythema, edema and/or pain. She will follow-up next week. She continues to smoke and her glucose levels remain elevated >250; she admits to compliance with offloading shoe 03/01/18 on evaluation today patient appears to be doing fairly well in regard to her left first toe ulcer. She has been tolerating the dressing changes with the [facility] Dressing without complication and overall this has definitely showed signs of improvement according to records as well is what the patient tells me today. I'm very pleased in that regard. She is having no pain today 03/08/18 She is here for follow up evaluation of [name] left great toe ulcer. She remains non-compliant with glucose control and smoking cessation; glucose levels consistently >200. She states that she got new shoe inserts/peg assist. She admits to compliance with offloading. Since my last evaluation there is significant improvement. We will switch to prisma at this time and she will follow up next week. She is noted to be tachycardic at this appointment, heart rate 120s; she has [name] history of heart rate 70-130 according to our records. She admits to extreme agitation r/t personal issues; she was advised to monitor her heartrate and contact her physician if it does not return to [name] more normal range (<100). She takes cardizem twice daily. 03/15/18-She is here in follow-up evaluation for left great toe ulcer. She remains noncompliant with glucose control and smoking cessation. She admits to compliance with wearing offloading shoe. The ulcer is improved/stable and we will continue with the same treatment plan and she will follow-up next week 03/22/18-She is here for evaluation for left great toe ulcer. There continues to be significant improvement despite recurrent  hyperglycemia (over 500 yesterday) and she continues to smoke. She has been compliant with offloading and we will continue with same treatment plan and she will follow-up next week. 03/29/18-She is here for evaluation for left great toe ulcer. Despite continuing to smoke and uncontrolled diabetes she continues to improve. She is compliant with offloading shoe. We will continue with  the same treatment plan and she will follow-up next week 04/05/18- She is here in follow up evaluation for [name] left great toe ulcer; she presents with small pustule to left fifth toe (resembles ant bite). She admits to compliance with wearing offloading shoe; continues to smoke or have uncontrolled blood glucose control. There is more callus than usual with evidence of bleeding; she denies known trauma. 04/12/18-She is here for evaluation of left great toe ulcer. Despite noncompliance with glycemic control and smoking she continues to make improvement. She continues to wear offloading shoe. The pustule, that was identified last week, to the left fifth toe is resolved. She will follow-up in 2 weeks 05/03/18-she is seen in follow-up evaluation for [name] left great toe ulcer. She is compliant with offloading, otherwise noncompliant with glycemic control and smoking. She has plateaued and there is minimal improvement noted. We will transition to [facility], replaced the insert to her surgical shoe and she will follow-up in one week 05/10/18- She is here in follow up evaluation for [name] left great toe ulcer. It appears stable despite measurement change. We will continue with same treatment plan and follow up next week. 05/24/18-She is seen in follow-up evaluation for [name] left great toe ulcer. She remains compliant with offloading, has made significant improvement in her diet, decreasing the amount of sugar/soda. She said her recent A1c was 10.9 which is lower than. She did see [name] diabetic nutritionist/educator yesterday. She continues to  smoke. We will continue with the same treatment plan and she'll follow-up next week. 05/31/18- She is seen in follow-up evaluation for left great toe ulcer. She continues to remain compliant with offloading, continues to make improvement in her diet, increasing her water and decreasing the amount of sugar/soda. She does continue to smoke with no desire to quit. We will apply Prisma to the depth and Hydrofera Blue over. We have not received insurance authorization for oasis. She will follow up next week. 06/07/18-She is seen in follow-up evaluation for left great toe ulcer. It has stalled according to today's measurements although base appears stable. She says she saw [name] diabetic educator yesterday; her average blood sugars are less than 300 which is an improvement for her. She continues to smoke and states "that's my next step" She continues with water over soda. We will order for xray, culture and reinstate ace wrap compression prior to placing apligraf for next week. She is voicing no complaints or concerns. Her dressing will change to iodoflex over the next week in preparation for apligraf. 06/14/18-She is seen in follow-up evaluation for left great toe ulcer. Plain film x-ray performed last week was negative for osteomyelitis. Wound culture obtained last week grew strep B and OSSA; she is initiated on keflex and cefdinir today; there is erythema to the toe which could be from ace wrap compression, she has [name] history of wrapping too tight and has has been encouraged to maintain ace wraps that we place today. We will hold off on application of apligraf today, will apply next week after antibiotic therapy has been initiated. She admits today that she has resumed taking [name] shower with her foot/toe submerged in water, she has been reminded to keep foot/toe out of the bath water. She will be seen in follow up next week 06/21/18-she is seen in follow-up evaluation for left great toe ulcer. She is tolerating  antibiotic therapy with no GI disturbance. The wound is stable. Apligraf was applied today. She has been decreasing her smoking, only had  4 cigarettes yesterday and 1 today. She continues being more compliant in diabetic diet. She will follow-up next week for evaluation of site, if stable will remove at 2 weeks. 06/28/18- She is here in follow up evalution. Apligraf was placed last week, she states the dressing fell off on Tuesday and she was dressing with hydrofera blue. She is healed and will be discharged from the clinic today. She has been instructed to continue with smoking cessation, continue monitoring glucose levels, offloading for an additional 4 weeks and continue with hydrofera blue for additional two weeks for any possible microscopic opening. Readmission: 08/07/18 on evaluation today patient presents for reevaluation concerning the ulcer of her right great toe. She was previously discharged on 06/28/18 healed. Nonetheless she states that this began to show signs of drainage she subsequently went to her primary care provider. Subsequently an x-ray was performed on 08/01/18 which was negative. The patient was also placed on antibiotics at that time. Fortunately they should have been effective for the infection. Nonetheless she's been experiencing some improvement but still has [name] lot of drainage coming from the wound itself. 08/14/18 on evaluation today patient's wound actually does show signs of improvement in regard to the erythema at this point. She has completed the antibiotics. With that being said we did discuss the possibility of placing her in [name] total contact cast as of today although I think that I may want to give this just [name] little bit more time to ensure nothing recurrence as far as her infection is concerned. I do not want to put in the cast and risk infection at that time if things are not completely resolved. With that being said she is gonna require some debridement  today. 08/21/18 on evaluation today patient actually appears to be doing okay in regard to her toe ulcer. She's been tolerating the dressing changes without complication. With that being said it does appear that she is ready and in fact I think it's appropriate for [place] to go ahead and initiate the total contact cast today. Nonetheless she will require some sharp debridement to prepare the wound for application. Overall I feel like things have been progressing well but we do need to do something to get this to close more readily. 08/24/18 patient seen today for reevaluation after having had the total contact cast applied on Tuesday. She seems to have done very well the wound appears to be doing great and overall I'm pleased with the progress that she's made. There were no abnormal areas of rubbing from the cast on her lower extremity. 08/30/18 on evaluation today patient actually appears to be completely healed in regard to her plantar toe ulcer. She tells me at this point she's been having [name] lot of issues with the cast. She almost fell [name] couple of times the state shall the step of her dog [name] couple times as well. This is been [name] very frustrating process for her other nonetheless she has completely healed the wound which is excellent news. Overall there does not appear to be the evidence of infection at this time which is great news. 09/11/18 evaluation today patient presents for follow-up concerning her great toe ulcer on the left which has unfortunately reopened since I last saw her which was only [name] couple of weeks ago. Unfortunately she was not able to get in to get the shoe and potentially the AFO that's gonna be necessary due to her left foot drop. She continues with offloading shoe but this  is not enough to prevent her from reopening it appears. When we last had her in the total contact cast she did well from [name] healing standpoint but unfortunately the wound reopened as soon as she came out of the cast  within just [name] couple of weeks. 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 This is also part of the reason why she falls on [name] regular basis. I just do not believe that is gonna be safe for [place] to reinitiate the total contact cast as last time we had this on she fell 3 times one week which is definitely not normal for her. SHANASIA, IBRAHIM [name] ([id number]) [id number] Page 13 of 20 09/18/18 upon evaluation today the patient actually appears to be doing about the same in regard to her toe ulcer. She did not contact [employer] as I asked her to even though I had given her the prescription. In fact she actually states that she has no idea where the prescription is. She did apparently call [employer] and they told her that all she needed to do was bring the prescription in order to be able to be seen and work on getting the AFO for her left foot. With all that being said she still does not have an appointment and I'm not sure were things stand that regard. I will give her [name] new prescription today in order to contact them to get this set up. 09/25/18 on evaluation today patient actually appears to be doing about the same in regard to her toes ulcer. She does have [name] small areas which seems to have [name] lot of callous buildup around the edge of the wound which is going to need sharp debridement today. She still is waiting to be scheduled for evaluation with [employer] for possibility of an AFO. She states there supposed to call her tomorrow to get this set up. Unfortunately it does appear that her foot specifically the toe area is showing signs of erythema. There does not appear to be any systemic infection which is in these good news. 10/02/18 on evaluation today patient actually appears to be doing about  the same in regard to her toe ulcer. This really has not done too well although it's not significantly larger it's also not significantly smaller. She has been tolerating the dressing changes without complication. She actually has her appointment with [employer] and [facility] tomorrow to hopefully be measured for obtaining and AFO splint. I think this would be helpful preventing this from reoccurring. We had contemplated starting the cast this week although to be honest I am reluctant to do that as she's been having nausea, vomiting, and seizure activity over the past three days. She has [name] history of seizures and have been told is nothing that can be done for these. With that being said I do believe that along with the seizures have the nausea vomiting which upon further questioning doesn't seem to be the normal for her and makes me concerned for the possibility of infection or something else going on. I discussed this with the patient and her mother during the office visit today. I do not feel the wound is effective but maybe something else. The responses this was "this just happens to her at times and we don't know why".  They did not seem to be interested in going to the hospital to have this checked out further. 10/09/18 on evaluation today patient presents for follow-up concerning her ongoing toe ulcer. She has been tolerating the dressing changes without complication. Fortunately there does not appear to be any evidence of infection which is great news however I do think that the patient would benefit from going ahead for with the total contact cast. She's actually in [name] wheelchair today she tells me that she will use her walker if we initiate the cast. I was very specific about the fact that if we were gonna do the cast I wanted to make sure that she was using the walker in order to prevent any falls. She tells me she does not have stairs that she has to traverse on [name] regular basis at her home. She has  not had any seizures since last week again that something that happens to her often she tells me she did talk to [employer] and they said that it may take up to three weeks to get the brace approved for her. Hopefully that will not take that long but nonetheless in the meantime I do think the cast could be of benefit. 10/12/18 on evaluation today patient appears to be doing rather well in regard to her toe ulcer. It's just been [name] few days and already this is significantly improved both as far as overall appearance and size. Fortunately there's no sign of infection. She is here for her first obligatory cast change. 10/19/18 Seen today for follow up and management of left great toe ulcer. Wound continues to show improvement. Noted small open area with seroussang drainage with palpation. Denies any increased pain or recent fevers during visit. She will continue calcium alginate with offloading shoe. Denies any questions or concerns during visit. 10/26/18 on evaluation today patient appears to be doing about the same as when I last saw her in regard to her wound bed. Fortunately there does not appear to be any signs of infection. Unfortunately she continues to have [name] breakdown in regard to the toe region any time that she is not in the cast. It takes almost no time at all for this to happen. Nonetheless she still has not heard anything from the brace being made by [employer] as to when exactly this will be available to her. Fortunately there is no signs of infection at this time. 10/30/18 on evaluation today patient presents for application of the total contact cast as we just received him this morning. Fortunately we are gonna be able to apply this to her today which is great news. She continues to have no significant pain which is good news. Overall I do feel like things have been improving while she was the cast is when she doesn't have [name] cast that things get worse. She still has not really heard anything from  [employer] regarding her brace. 11/02/18 upon evaluation today patient's wound already appears to be doing significantly better which is good news. Fortunately there does not appear to be any signs of infection also good news. Overall I do think the total contact cast as before is helping to heal this area unfortunately it's just not gonna likely keep the area closed and healed without her getting her brace at least. Again the foot drop is [name] significant issue for her. 11/09/18 on evaluation today patient appears to be doing excellent in regard to her toe ulcer which in fact is completely healed. Fortunately we  finally got the situation squared away with the paperwork which was needed to proceed with getting her brace approved by Medicaid. I have filled that out unfortunately that information has been sent to the orthopedic office that I worked at 2 1/2 years ago and not tired Current wound care measures. Fortunately she seems to be doing very well at this time. 11/23/18 on evaluation today patient appears to be doing More Poorly Compared to Last Time I Saw Her. At [facility] She Had Completely Healed. Currently she is continuing to have issues with reopening. She states that she just found out that the brace was approved through Medicaid now she just has to go get measured in order to have this fitted for her and then made. Subsequently she does not have an appointment for this yet that is going to complicate things we obviously cannot put her back in the cast if we do not have everything measured because they're not gonna be able to measure her foot while she is in the cast. 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 Obviously this is unfortunate and does have me somewhat worried as well. 11/30/18 on evaluation today patient's toe ulcer actually appears to be doing fairly well. The good news is she will be  getting her brace in the shoes next week on Wednesday. Hopefully we will be able to get this to heal without having to go back in the cast however she may need the cast in order to get the wound completely heal and then go from there. Fortunately there's no signs of infection at this time. 12/07/18 on evaluation today patient fortunately did receive her brace and she states she could tell this definitely makes her walk better. With that being said she's been having issues with her toe where she noticed yesterday there was [name] lot of tissue that was loosing off this appears to be much larger than what it was previous. She also states that her leg has been read putting much across the top of her foot just about the ankle although this seems to be receiving somewhat. The total area is still red and appears to be someone infected as best I can tell. She is previously taken Bactrim and that may be [name] good option for her today as well. We are gonna see what I wound culture shows as well and I think that this is definitely appropriate. With that being said outside of the culture I still need to initiate something in the interim and that's what I'm gonna go ahead and select Bactrim is [name] good option for her. 12/14/18 on evaluation today patient appears to be doing better in regard to her left great toe ulcer as compared to last week's evaluation. There's still some erythema although this is significantly improved which is excellent news. Overall I do believe that she is making good progress is still gonna take some time before she is where I would like her to be from the standpoint of being able to place her back into the total contact cast. Hopefully we will be where we need to be by next week. 12/21/18 on evaluation today patient actually appears to be doing poorly in regard to her toe ulcer. She's been tolerating the dressing changes without complication. Fortunately there's no signs of systemic infection although  she does have [name] lot of drainage from the toe  ulcer and this does seem to be causing some issues at this point. She does have erythema on the distal portion of her toe that appears to be likely cellulitis. 12/28/18 on evaluation today patient actually appears to be doing [name] little better in my pinion in regard to her toe ulcer. With that being said she still does have some evidence of infection at this time and for her culture she had both E. coli as well as enterococcus as organisms noted on evaluation. For that reason I think that though the Keflex likely has treated the E. coli rather well this has really done nothing for the enterococcus. We are going to have to initiate treatment for this specifically. 01/04/19 on evaluation today patient's toe actually appears to be doing better from the standpoint of infection. She currently would like to see about putting the cash back on I think that this is appropriate as long as she takes care of it and keeps it from getting wet. She is gonna have some drainage we can definitely pass this up with Drawtex and alginate to try to prevent as much drainage as possible from causing the problems. With that being said I do want to at least try her with the cast between now and Tuesday. If there any issues we can't continue to use it then I will discontinue the use of the cast at that point. SHAMYRA, FARIAS [name] ([id number]) [id number] Page 14 of 20 01/08/19 on evaluation today patient actually appears to be doing very well as far as her foot ulcer specifically the great toe on the left is concerned. She did have an area of rubbing on the medial aspect of her left ankle which again is from the cast. Fortunately there's no signs of infection at this point in this appears to be [name] very slight skin breakdown. The patient tells me she felt it rubbing but didn't think it was that bad. Fortunately there is no signs of active infection at this time which is  good news. No fevers, chills, nausea, or vomiting noted at this time. 01/15/19 on evaluation today patient actually appears to be doing well in regard to her toe ulcer. Again as previous she seems to do well and she has the cast on which indicates to me that during the time she doesn't have [name] cast on she's putting way too much pressure on this region. Obviously I think that's gonna be an issue as with the current national emergency concerning the Covid-19 Virus it has been recommended that we discontinue the use of total contact casting by the chief medical officer of our company, Dr. Maurine Minister. The reasoning is that if [name] patient becomes sick and cannot come into have the cast removed they could not just leave this on for an additional two weeks. Obviously the hospitals also do not want to receive patient's who are sick into the emergency department to potentially contaminate the region and spread the Covid-19 Virus among other sick individuals within the hospital system. Therefore at this point we are suspending the use of total contact cast until the current emergency subsides. This was all discussed with the patient today as well. 01/22/19 on evaluation today patient's wound on her left great toe appears to be doing slightly worse than previously noted last week. She tells me that she has been on this quite [name] bit in fact she tells me she's been awake for 38 straight hours. This is due to the fact that she's having to  care for grandparents because nobody else will. She has been taking care of them for five the last seven days since I've seen her they both have dementia his is from [name] stroke and her grandmother's was progressive. Nonetheless she states even her mom who knows her condition and situation has only help two of those days to take care of them she's been taking care of the rest. Fortunately there does not appear to be any signs of active infection in regard to her toe at this point  although obviously it doesn't look as good as it did previous. I think this is directly related to her not taking off the pressure and friction by way of taking things easy. Though I completely understand what's going on. 01/29/19 on evaluation today patient's tools are actually appears to be showing some signs of improvement today compared to last week's evaluation as far as not necessarily the overall size of the wound but the fact that she has some new skin growth in between the two ends of the wound opening. Overall I feel like she has done well she states that she had [name] family member give her what sounds to be [name] CAM walker boot which has been helpful as well. 02/05/19 on evaluation today patient's wound bed actually appears to be doing significantly better in regard to her overall appearance of the size of the wound. With that being said she is still having an issue with offloading efficiently enough to get this to close. Apparently there is some signs of infection at this point as well unfortunately. Previously she's done well of Augmentin I really do not see anything that needs to be culture currently but there theme and cellulitis of the foot that I'm seeing I'm gonna go ahead and place her on an antibiotic today to try to help clear this up. 02/12/2019 on evaluation today patient actually appears to be doing poorly in regard to her overall wound status. She tells me she has been using her offloading shoe but actually comes in today wearing her tennis shoe with the AFO brace. Again as I previously discussed with her this is really not sufficient to allow the area to heal appropriately. Nonetheless she continues to be somewhat noncompliant and I do wonder based on what she has told my nurse in the past as to whether or not [name] good portion of this noncompliance may be recreational drug and alcohol related. She has had [name] history of heroin overdose and this was fairly recently in the past couple of  months that have been seeing her. Nonetheless overall I feel like her wound looks significantly worse today compared to what it was previous. She still has significant erythema despite the Augmentin I am not sure that this is an appropriate medication for her infection I am also concerned that the infection may have gone down into her bone. 02/19/19 on evaluation today patient actually appears to be doing about the same in regard to her toe ulcer. Unfortunately she continues to show signs of bone exposure and infection at this point. There does not appear to be any evidence of worsening of the infection but I'm also not really sure that it's getting significantly better. She is on the Augmentin which should be sufficient for the Staphylococcus aureus infection that she has at this point. With that being said she may need IV antibiotics to more appropriately treat this. We did have [name] discussion today about hyperbaric option therapy. 02/28/19 on evaluation today  patient actually appears to be doing much worse in regard to the wound on her left great toe as compared to even my previous evaluation last week. Unfortunately this seems to be training in [name] pretty poor direction. Her toe was actually now starting to angle laterally and I can actually see the entire joint area of the proximal portion of the digit where is the distal portion of the digit again is no longer even in contact with the joint line. Unfortunately there's [name] lot more necrotic tissue around the edge and the toe appears to be showing signs of becoming gangrenous in my pinion. I'm very concerned about were things stand at this point. She did see infectious disease and they are planning to send in [name] prescription for Sivextro for her and apparently this has been approved. With that being said I don't think she should avoid taking this but at the same time I'm not sure that it's gonna be sufficient to save her toe at this point. She tells me that  she still having to care for grandparents which I think is putting quite [name] bit of strain on her foot and specifically the total area and has caused this to break down even to [name] greater degree than would've otherwise been expected. 03/05/19 on evaluation today patient actually appears to be doing quite well in regard to her toe all things considering. She still has bone exposed but there appears to be much less your thing on overall the appearance of the wound and the toe itself is dramatically improved. She still does have some issues currently obviously with infection she did see vascular as well and there concerned that her blood flow to the toad. For that reason they are setting up for an angiogram next week. 03/14/19 on evaluation today patient appears to be doing very poor in regard to her toe and specifically in regard to the ulceration and the fact that she's starting to notice the toe was leaning even more towards the lateral aspect and the complete joint is visible on the proximal aspect of the joint. Nonetheless she's also noted [name] significant odor and the tip of the toe is turning more dark and necrotic appearing. Overall I think she is getting worse not better as far as this is concerned. For that reason I am recommending at this point that she likely needs to be seen for likely amputation. READMISSION 03/19/2021 This is [name] patient that we cared for in this clinic for [name] prolonged period of time in 2019 and 2020 with [name] left foot and left first toe wound. 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 Excell Seltzer. In December 2021 she had an ulcer on her right great toe as well as the fourth and fifth toes. 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 She also had an angiogram at that time and underwent angioplasty of the right anterior tibial artery. In  any case she claims that the wound on the right foot is closed I did not look at this today which was probably an oversight although I think that should be done next week. 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 According to Dr. Bernette Redbird last review that she was out of the area being cared for by another physician but recently came back to  his attention. 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 [name] culture of this area showed E. coli apparently before she came back to see Dr. Excell Seltzer she was supposed to be receiving antibiotics but she did not really take them. Nor is she offloading this area at all. Finally her last hemoglobin A1c listed in epic was in March 2022 at 14.1 she says things are [name] lot better since then although I am not sure. 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 She had this wound at the time. During this admission she had E. coli sepsis [name] TEE was negative. 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 She is still [name] heavy cigarette smoker. She has [name] history of bipolar disorder seizure disorder ABI in our clinic was 1.05 6/1; left midfoot in the setting of [name] TMA done previously. Round circular wound with [name] "knuckle" of protruding tissue. The problem is that the knuckle was not attached to any of the surrounding granulation and this probed proximally widely I removed [name] large portion of this tissue. This wound goes with considerable undermining laterally. I do not feel any bone there was no purulence but this is [name] deep wound. 6/8; in spite of the debridement I did last week. She arrives with [name]  wound looking exactly the same. [name] protruding "knuckle" of tissue nonadherent to most of the surrounding tissue. There is considerable depth around this from 6-12 o'clock at 2.7 cm and undermining of 1 cm. This does not look overtly infected and the x- ray I did last week was negative for any osseous abnormalities. We have been using silver collagen 6/15; deep tissue culture I did last week showed moderate staph aureus and moderate Pseudomonas. This will definitely require prolonged antibiotic therapy. The pathology on the protuberant area was negative for malignancy fungus etc. the comment was chronic ulceration with exuberant fibrin necrotic debris and [name], [name] ([id number]) [id number] Page 15 of 20 negative for malignancy. We have been using silver collagen. I am going to be prescribing Levaquin for [address] scan of the foot is down for 7/5 6/22; CT scan of the foot on 7 5. She says she has hardware in the left leg from her previous fracture. She is on the Levaquin for the deep tissue culture I did that showed methicillin sensitive staph aureus and Pseudomonas. I gave her [name] 2-week supply and she will have another week. She arrives in clinic today with the same protuberant tissue however this is nonadherent to the tissue surrounding it. I am really at [name] loss to explain this unless there is underlying deep tissue infection 6/29; patient presents for 1 week follow-up. She has been using collagen to the wound bed. She reports taking her antibiotics as prescribed.She has no complaints or issues today. She denies signs of infection. 7/6; patient presents for one week followup. She has been using collagen to the wound bed. She states she is taking Levaquin however at times she is not able to keep it down. She denies signs of infection. 7/13; patient presents for 1 week follow-up. She has been using silver alginate to the wound bed. She still has nausea when taking  her antibiotics. She denies signs  of infection. 7/20; patient presents for 1 week follow-up. She has been using silver alginate with gentamicin cream to the wound bed. She denies any issues and has no complaints today. She denies signs of infection. 7/27; patient presents for 1 week follow-up. She continues to use silver alginate with gentamicin cream to the wound bed. She reports starting her antibiotics. She has no issues or complaints. Overall she reports stability to the wound. 8/3; patient presents for 1 week follow-up. She has been using silver alginate with gentamicin cream to the wound bed. She reports completing all antibiotics. She has no issues or complaints today. She denies signs of infection. 8/17; patient presents for 2-week follow-up. He is to use silver alginate to the wound bed. She has no issues or complaints today. She denies signs of infection. She reports her pain has improved in her foot since last clinic visit 8/24; patient presents for 1 week follow-up. She continues to use silver alginate to the wound bed. She has no issues or complaints. She denies signs of infection. Pain is stable. 9/7; patient presents for follow-up. She missed her last week appointment due to feeling ill. She continues to use silver alginate. She has [name] new wound to the right lower extremity that is covered in eschar. She states It occurred over the past week and has no idea how it started. She currently denies signs of infection. 9/14; patient presents for follow-up. T the left foot wound she has been using gentamicin cream and silver alginate. T the right lower extremity wound she has o o been keeping this covered and has not obtain Santyl. 9/21; patient presents for follow-up. She reports using gentamicin cream and silver alginate to the left foot and Santyl to the right lower extremity wound. She has no issues or complaints today. She denies signs of infection. 9/28; patient presents for  follow-up. She reports [name] new wound to her right heel. She states this occurred [name] few days ago and is progressively gotten worse. She has been trying to clean the area with [name] Q-tip and Santyl. She reports stability in the other 2 wounds. She has been using gentamicin cream and silver alginate to the left foot and Santyl to the right lower extremity wound. 10/12; patient presents for follow-up. She reports improvement to the wound beds. She is seeing vein and vascular to discuss the potential of [name] left BKA. She states they are going to do an arteriogram. She continues to use silver alginate with dressing changes to her wounds. 11/2; patient presents for follow-up. She states she has not been doing dressing changes to the wound beds. She states she is not able to offload the areas. She reports chronic pain to her left foot wound. 11/9; patient presents for follow-up. She came in with only socks on. She states she forgot to put on shoes. It is unclear if she is doing any dressing changes. She currently denies systemic signs of infection. 11/16; patient presents for follow-up. She came again only with socks on. She states she does not wear shoes ever. It is unclear if she does dressing changes. She currently denies systemic signs of infection. 11/23; patient presents for follow-up. She wore her shoes today. It still unclear exactly what dressing she is using for each wound but she did states she obtained Dakin's solution and has been using this to the left foot wound. She currently denies signs of infection. 11/30; patient presents for follow-up. She has no issues or complaints today. She  currently denies signs of infection. 12/7; patient presents for follow-up. She has no issues or complaints today. She has been using Hydrofera Blue to the right heel wound and Dakin solution to the left foot wound. Her right anterior leg wound is healed. She currently denies signs of infection. 12/14; patient presents  for follow-up. She has been using Hydrofera Blue to the right heel and Dakin's to the left foot wounds. She has no issues or complaints today. She denies signs of infection. 12/21; patient presents for follow-up. She reports using Hydrofera Blue to the right heel and Dakin's to the left foot wound. She denies signs of infection. 12/28; patient presents for follow-up. She continues to use Dakin's to the left foot wound and Hydrofera Blue to the right heel wound. She denies signs of infection. 1/4; patient presents for follow-up. She has no issues or complaints today. She denies signs of infection. 1/11; patient presents for follow-up. It is unclear if she has been dressing these wounds over the past week. She currently denies signs of infection. 1/18; patient presents for follow-up. She states she has been using Dakin's wet-to-dry dressings to the left foot. She has been using Hydrofera Blue to the right foot foot wound. She states that the anterior right leg wound has reopened and draining serous fluid. She denies signs of infection. 1/25; patient presents for follow-up. She has no issues or complaints today. 2/1; patient presents for follow-up. She has no issues or complaints today. She denies signs of infection. 2/8; patient presents for follow-up. She has lost her surgical shoes. She did not have [name] dressing to the right heel wound. She currently denies signs of infection. 2/15; patient presents for follow-up. She reports more pain to the right heel today. She denies purulent drainage Or fever/chills 2/22; patient presents for follow-up. She reports taking clindamycin over the past week. She states that she continues to have pain to her right heel. She reports purulent drainage. Readmission 03/16/2022 Ms. Cyerra Yim is [name] 47 year old female with [name] past medical history of type 2 diabetes, osteomyelitis to her feet, chronic systolic heart failure and bipolar disorder that presents to the clinic  for bilateral feet wounds and right lower extremity wound. She was last seen in our clinic on 12/15/2021. At that time she had purulent drainage coming out of her right plantar foot and I recommended she go to the ED. She states she went to [facility] and has been there for the past 3 months. I cannot see the records. She states she had OR debridement and was on several weeks of IV antibiotics while inpatient. Since discharge she has not been taking care of the wound beds. She had nothing on her feet other than socks today. She currently denies signs of infection. 5/31; patient presents for follow-up. She has been using Dakin's wet-to-dry dressings to the wound beds on her feet bilaterally and antibiotic ointment to the right anterior leg wound. She had [name] wound culture done at last clinic visit that showed moderate Pseudomonas aeruginosa sensitive to ciprofloxacin. She currently denies systemic signs of infection. 6/14; patient presents for follow-up. She received Keystone 5 days ago and has been using this on the wound beds. She states that last week she had to go to [place] [name] ([id number]) [id number] Page 16 of 20 the hospital because she had increased warmth and erythema to the right foot. She was started on 2 oral antibiotics. She states she has been taking these. She currently denies systemic  signs of infection. She has no issues or complaints today. 6/21; patient presents for follow-up. She states she has been using Keystone antibiotics to the wound beds. She has no issues or complaints today. She denies signs of infection. 6/28; patient presents for follow-up. She has been using Keystone antibiotics to the wound beds. She has no issues or complaints today. 7/12; patient presents for follow-up. Has been using Keystone antibiotics to the wound beds with calcium alginate. She has no issues or complaints today. She never followed up with her orthopedic  surgeon who did the OR debridement to the right foot. We discussed the total contact cast for the left foot and patient would like to do this next week. 7/19; patient presents for follow-up. She has been using Keystone antibiotics with calcium alginate to the wound beds. She has no issues or complaints today. Patient is in agreement to do the total contact cast of the left foot today. She knows to return later this week for the obligatory cast change. 05-13-2022 upon evaluation today patient's wound which she has the cast of the left leg actually appears to be doing significantly better. Fortunately I do not see any signs of active infection locally or systemically which is great news and overall I am extremely pleased with where we stand currently. 7/26; patient presents for follow-up. She has [name] cast in place for the past week. She states it irritated her shin. Other than that she tolerated the cast well. She states she would like [name] break for 1 week from the cast. We have been using Keystone antibiotic and Aquacel to both wound beds. She denies signs of infection. 8/2; patient presents for follow-up. She has been using Keystone and Aquacel to the wound beds. She denies any issues and has no complaints. She is agreeable to have the cast placed today for the left leg. 06-03-2022 upon evaluation today patient appears to be doing well with regard to her wound she saw some signs of improvement which is great news. Fortunately I do not see any evidence of active infection locally or systemically at this time which is great news. No fevers, chills, nausea, vomiting, or diarrhea. 8/16; patient presents for follow-up. She has no issues or complaints today. We have been using Keystone and Aquacel to the wound beds. The left lower extremity is in [name] total contact cast. She is tolerated this well. 8/23; patient presents for follow-up. She has had the total contact cast on the left leg for the past week.  Unfortunately this has rubbed and broken down the skin to the medial foot. She currently denies signs of infection. She has been using Keystone antibiotic to the right plantar foot wound. 8/30; patient presents for follow-up. We have held off on the total contact cast for the left leg for the past week. Her wound on the left foot has improved and the previous surrounding breakdown of skin has epithelialized. She has been using Keystone antibiotic to both wound beds. She has no issues or complaints today. She denies signs of infection. 9/6; patient presents for follow-up. She has ordered her's Keystone antibiotic and this is arriving this week. She has been doing Dakin's wet-to-dry dressings to the wound beds. She denies signs of infection. She is agreeable to the total contact cast today. 9/13; patient presents for follow-up. She states that the cast caused her left leg shin to be sore. She would like to take [name] break from the cast this week. She has been using [address]  antibiotic to the right plantar foot wound. She denies signs of infection. 9/20; patient presents for follow-up. She has been using Keystone antibiotics to the wound beds with calcium alginate to the right foot wound and Hydrofera Blue to the left foot wound. She is agreeable to having the cast placed today. She has been approved for Apligraf and we will order this for next clinic visit. 9/27; patient presents for follow-up. We have been using Keystone antibiotic with Hydrofera Blue to the left foot wound under [name] total contact cast. T the right o foot wound she has been using Keystone antibiotic and calcium alginate. She declines [name] total contact cast today. Apligraf is available for placement and she would like to proceed with this. 07-28-2022 upon evaluation today patient appears to be doing well currently in regard to her wound. She is actually showing signs of significant improvement which is great news. Fortunately I do not see any  evidence of active infection locally nor systemically at this time. She has been seeing Dr. Mikey Bussing and to be honest has been doing very well with the cast. Subsequently she comes in today with [name] cast on and we did reapply that today as well. She did not really want to she try to talk me out of that but I explained that if she wanted to heal this is really the right way to go. Patient voiced understanding. In regard to her right foot this is actually [name] lot better compared to the last time I saw her which is also great news. 10/11; patient presents for follow-up. Apligraf and the total contact cast was placed to the left leg at last clinic visit. She states that her right foot wound had burning pain to it with the placement of Apligraf to this area. She has been doing [place] over this area. She denies signs of infection including increased warmth, erythema or purulent drainage. 11/1; 3-week follow-up. The patient fortunately did not have [name] total contact cast or an Apligraf and on the left foot. She has been using Keystone ABD pads and kerlix and her own running shoes She arrives in clinic today with thick callus and [name] very poor surface on the left foot on the right nonviable skin subcutaneous tissue and [name] deep probing hole. 11/15; patient missed her last clinic appointment. She states she has not been dressing the wound beds for the past 2 weeks. She states that at she had [name] new roommate but is now going back to live with her mother. Apparently its been [name] distracting 2 weeks. Patient currently denies signs of infection. 11/22; patient presents for follow-up. She states she has been using Keystone antibiotic and Dakin's wet-to-dry dressings to the wound beds. She is agreeable for cast placement today. We had ordered Apligraf however this has not been received by our facility. 11/29; Patient had [name] total contact cast placed at last clinic visit and she tolerated this well. We were using silver alginate  under the cast. Patient's been using Keystone antibiotic with Aquacel to the right plantar foot wound. She has no issues or complaints today. Apligraf is available for placement today. Patient would like to proceed with this. 12/6; patient presents for follow-up. She had Apligraf placed in standard fashion last clinic visit under the total contact cast to the left lower extremity. She has been using Keystone antibiotic and Aquacel to the right plantar foot wound. She has no issues or complaints today. 12/13; patient presents for follow-up. She has finished 5 Apligraf  placements. Was told she would not qualify for more. We have been doing [name] total contact cast to the left lower extremity. She has been using Keystone antibiotic and Aquacel to the right plantar foot wound. She has no issues or complaints today. 12/20; patient presents for follow-up. We have been using Hydrofera Blue with Keystone antibiotic under [name] total contact cast of the left lower extremity. She reports using Keystone antibiotic and silver alginate to the right heel wound. She has no issues or complaints today. 12/27; patient presents with [name] healthy wound on the left midfoot. We have Apligraf to apply that to that more also using [name] total contact cast. On the right we are using Keystone and silver alginate. She is offloading the right heel with [name] surgical shoealthough by her admission she is on her feet quite [name] bit 1/3; patient presents for follow-up. Apligraf was placed to the wound beds last clinic visit. She was placed in [name] total contact cast to the left lower extremity. She declines [name] total contact cast today. She states that her mother is in the hospital and she cannot adequately get around with the cast on. DEBRALEE, BRAAKSMA [name] ([id number]) [id number] Page 17 of 20 1/10; patient presents for follow-up. She declined the total contact cast at last clinic visit. Both wounds have declined in appearance. She  states that she has been on her feet and not offloading the wound beds. She currently denies signs of infection. 1/17; patient presents for follow-up. She had the total contact cast along with Apligraf placed last week to the left lower extremity. She tolerated this well. She has been using Aquacel Ag and Keystone antibiotic to the right heel wound. She currently denies signs of infection. She has no issues or complaints today. 1/24; patient presents for follow-up. We have been using Apligraf to the left foot wound along with [name] total contact cast. She has done well with this. T the right o heel wound she has been using Aquacel Ag and Keystone antibiotic ointment. She has no issues or complaints today. She denies signs of infection. 1/31; patient presents for follow-up. We have been using Apligraf to the left foot wound along with the total contact cast. She continues to do well with this. To the right heel we have been using Aquacel Ag and Keystone antibiotic ointment. She has no issues or complaints today. 12-01-2022 upon evaluation patient is seen today on my schedule due to the fact that she unfortunately was in the hospital yesterday. Her cast needed to come off the only reason she is out of the hospital is due to the fact that they would not take it off at the hospital which is somewhat bewildered reading to me to be perfectly honest. I am not certain why this was but either way she was released and then was placed on my schedule today in order to get this off and reapply the total contact casting as appropriate. I do not have an Apligraf for her today it was applied last week and today's actually expired yesterday as there was some scheduling conflicts with her being in the hospital. Nonetheless we do not have that for reapplication today but the good news that she is not draining too much and the Apligraf can go for up to 2 weeks so I am going to go ahead and reapply the total contact casting but  we are going to leave the Apligraf in place. 2/14; patient presents for follow-up. T the left leg she  has had the total contact cast and Apligraf for the past week. She has had no issues with this. T the o o right heel she has been using antibiotic ointment and Aquacel Ag. 2/21; patient presents for follow-up. We have been using Apligraf and [name] total contact cast to the left lower extremity. She is tolerated this well. Unfortunately she is not approved for any more Apligraf per insurance. She has been using antibiotic ointment and Aquacel Ag to the right foot. She has no issues or complaints today. 2/28; patient presents for follow-up. We have been using Hydrofera Blue and antibiotic ointment under the total contact cast to the left lower extremity. He has been using Aquacel Ag with antibiotic ointment to the right plantar foot. She has no issues or complaints today. 3/6; patient presents for follow-up. She did not obtain her gentamicin ointment. She has been using Aquacel Ag to the right plantar foot wound. We have been using Hydrofera Blue with antibiotic ointment under the total contact cast to the left lower extremity. She has no issues or complaints today. 3/12; patient presents for follow-up. She has been using gentamicin ointment and Aquacel Ag to the right plantar foot wound. We have been using Hydrofera Blue with antibiotic ointment under the total contact cast on the left lower extremity foot wound. She has no issues or complaints today. 3/20; patient presents for follow-up. She has been using gentamicin ointment and Aquacel Ag to the right plantar foot wound. We have been using Hydrofera Blue with antibiotic ointment under the total contact cast to the left lower extremity wound. She has no issues or complaints today. 3/27; patient presents for follow-up. We have been using antibiotic ointment and Aquacel Ag to the right plantar foot wound. We have been using Hydrofera Blue with antibiotic  ointment under the total contact cast to the left lower extremity. Both wounds are smaller. 01-24-2023 upon evaluation today patient actually appears to be doing better compared to last time I saw her. It has been several weeks since I last saw her but nonetheless I think the total contact casting is doing [name] good job. Fortunately I do not see any evidence of infection or worsening overall which is great news and in general I do believe that she is moving in the appropriate direction. It has been [name] very long road but nonetheless I feel like she is nearing the end at least in regard to the left foot and even the right foot looks much better to be perfectly honest. 01-31-2023 upon evaluation today patient appears to be doing well currently in regard to her wounds. She has been require some sharp debridement which I think is probably bone both sides to remove [name] lot of callus that has built up at this point. I discussed that with her today. Also think total contact cast is doing well for the left foot Emina continue that as well. 4/17; patient presents for follow-up. We have been doing Hydrofera Blue with antibiotic ointment under the total contact cast to the left lower extremity and Aquacel Ag with antibiotic ointment to the right foot wound. She has been using her offloading heel shoe here. 4/24; patient presents for follow-up. We have been doing Hydrofera Blue with antibiotic ointment under the total contact cast to the left lower extremity and Aquacel Ag with antibiotic ointment to the right foot wound. She has been using her offloading heel shoe here. Wounds are smaller with slough accumulation. 5/1; patient presents for follow-up. We have  been doing Hydrofera Blue with antibiotic ointment under the total contact cast to the left lower extremity and Aquacel Ag with antibiotic ointment to the right foot wound. Left foot wound is stable and right foot wound appears smaller. 5/8; diabetic ulcers on her  bilateral feet. On the left she is using Hydrofera Blue topical antibiotic under [name] total contact cast, on the right plantar heel gentamicin Aquacel Ag. According to our intake nurse both are making slow but steady improvements. 5/15; patient presents for follow-up. We have been doing Hydrofera Blue with antibiotic ointment under the total contact cast on the left and antibiotic ointment with Aquacel Ag on the right. Unfortunately the left foot wound has declined in size and appearance. We also do not have the total contact cast available in office today. Patient denies signs of infection. 5/22; patient presents for follow-up. We have been doing Hydrofera Blue and antibiotic ointment to the left foot and Aquacel Ag with antibiotic ointment to the right foot. We took [name] break from the cast since we did not have it placed at last week. Wounds look stable if not slightly improved. 03-27-2023 patient presents today she has had this For Most 2 Weeks Because She Was in the Hospital and They Did Not T It off. Again That Is Really Not ake Something That She Could Control but Nonetheless We Are Seeing Her at This Point Today for Reevaluation. We Do Want to Go and See about Reapply the Cast. Again We Had All Set Everything up to Go Ahead and Do This When We Went to Get the Cast Only to Realize That Not Had Come in and We Were out at the Moment. With That Being [place] We Will Get Have T Get the Exam before We Can Reapply the T Contact Casting Going Forward. o otal 6/12; patient presents for follow-up. She has been using Hydrofera Blue to the left foot wound and Aquacel Ag to the right foot wound. No cast was available last week placed on the patient so she has been offloading with her surgical shoe. She has no issues or complaints today. 6/19; patient presents for follow-up. We have been using Hydrofera Blue to the left foot wound under the total contact cast and Aquacel Ag to the right wound. She has no issues or  complaints today. The wounds are smaller. 6/26; patient presents for follow-up. We have been using Hydrofera Blue to the left foot wound under the total contact cast and Aquacel Ag to the right foot wound. Wounds are smaller. Patient has no issues or complaints today. 7/3; patient presents for follow-up. We have been using Hydrofera Blue to the left foot wound under the total contact cast and Aquacel Ag to the right foot wound. Wounds Appear smaller. No signs of infection. 7/10; patient presents for follow-up. We have been using Hydrofera Blue to the left foot wound under the total contact cast and Aquacel Ag to the right foot wound. Left foot wound is smaller. Right foot wound is stable. 7/17; patient presents for follow-up. We have been using Hydrofera Blue to the left foot under the total contact cast and Aquacel Ag to the right foot wound. She has no issues or complaints today. TIFFANCY, MOGER [name] ([id number]) [id number] Page 18 of 20 7/24; patient presents for follow-up. We have been using Hydrofera Blue to the left foot under the total contact cast and Aquacel Ag to the right foot wound. Unfortunately she was bit by her dog over the weekend and developed  [name] wound to the right lower leg. She has been keeping the area covered. 7/31; patient presents for follow-up. We have been using Hydrofera Blue to the left foot under the total contact cast and Aquacel Ag to the right foot wound and mupirocin ointment to the right anterior leg wound. She started her oral antibiotics 2 days ago. 8/7; patient presents for follow-up. We have been using Hydrofera Blue and antibiotic ointment to the left foot under the total contact cast and Aquacel Ag to the right foot wound. Antibiotic ointment to the right anterior leg wound. She completed her oral antibiotics. She has no issues or complaints today. Wounds continue to improve albeit slowly. 8/14; 8/14; patient has 3 wounds 1 on the left  plantar heel 1 on the right plantar heel and [name] small wound on the right anterior lower leg. We have been using Hydrofera Blue in the left heel Aquacel on the right heel. The left heel has been in [name] total contact cast 8/21; patient presents for follow-up. We have been using Hydrofera Blue to the left foot wound under the total contact cast. T the right heel patient has been o using Aquacel Ag. T the anterior leg wound antibiotic ointment. All wounds have healed. o 8/28; patient presents for follow-up. She has been using her surgical shoe with offloading felt pads. Her wounds have remained closed. 9/11; patient presents for final follow-up to assure that her wounds have remained closed. Unfortunately the left foot wound has opened again. But she has been wearing her Surgical shoe for offloading with felt pad. 07-14-2023 upon evaluation today patient appears to be doing well currently in regard to her wound. She has been tolerating the dressing changes without complication with total contact casting. I am going to be helping take care of her at this point as she is transferring from Dr. Mikey Bussing to me since Dr. Mikey Bussing is on maternity leave. 07-24-2023 upon evaluation today patient appears to be doing decently well currently in regard to her wound. She is actually showing signs of improvement in regard to the left foot and I feel like this is much better than last week no debridement even necessary today. We may have to perform some slight debridement come next week we will see how things go. Monitor for any signs of worsening overall and obviously if anything changes she should be letting us know. 08-01-2023 upon evaluation today patient appears to be doing excellent in regard to her feet. The right foot/heel area is completely healed she does have some callus buildup I am going to trim this down [name] little bit I do not want to build up to the point of cracking she is doing well with the urea cream. With  regard to the left heel she is doing well with casting I am going to perform some debridement here to clear away before reapplying the cast and I think she is really doing quite well though. 08-07-2023 upon evaluation patient's wound actually showed signs of excellent improvement and actually very pleased with where we stand I do believe that she is making good headway towards complete closure which is great news. 08-14-2023 upon evaluation today patient appears to be doing well currently in regard to her wound. She actually is going require [name] little bit of debridement but fortunately nothing too significant today actually feel like she is doing much better. This is mainly to remove callus on the little slough and biofilm down to good subcutaneous tissue. In general I  think that she is making excellent headway here. 08-21-2023 upon evaluation today patient appears to be doing well currently in regard to her wound which is actually showing signs of significant improvement. I am extremely pleased with where things stand I do not see any signs of infection at this time which is great news and overall I do think that we are making headway towards complete closure which is also excellent news. 08-30-2023 upon evaluation today patient's wound is showing signs of continued improvement. Fortunately I do not see any evidence of worsening overall and I believe that the patient is making excellent headway towards closure. I am extremely pleased with where we stand. Objective Constitutional Well-nourished and well-hydrated in no acute distress. Vitals Time Taken: 10:09 AM, Height: 69 in, Weight: 178 lbs, BMI: 26.3, Temperature: 99.1 F, Pulse: 70 bpm, Respiratory Rate: 18 breaths/min, Blood Pressure: 116/57 mmHg. Respiratory normal breathing without difficulty. Psychiatric this patient is able to make decisions and demonstrates good insight into disease process. Alert and Oriented x 3. pleasant and  cooperative. General Notes: Upon inspection patient's wound bed actually showed signs of good granulation epithelization at this point. Fortunately I see no signs of active infection and I do believe that the patient is making excellent headway here towards closure which is awesome news. Integumentary (Hair, Skin) Wound #12R status is Open. Original cause of wound was Pressure Injury. The date acquired was: 03/16/2020. The wound has been in treatment 76 weeks. The wound is located on the Left,Medial,Plantar Foot. The wound measures 0.1cm length x 0.2cm width x 0.1cm depth; 0.016cm^2 area and 0.002cm^3 volume. There is Fat Layer (Subcutaneous Tissue) exposed. There is [name] medium amount of serosanguineous drainage noted. The wound margin is flat and intact. There is no granulation within the wound bed. There is no necrotic tissue within the wound bed. 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 There was [name] T Contact otal Cast Procedure by [name], PA-C. Post procedure Diagnosis Wound #12R: Same as Pre-Procedure Plan Follow-up Appointments: Return Appointment in 1 week. Bathing/ Shower/ Hygiene: May shower; gently cleanse wound with antibacterial soap, rinse and pat dry prior to dressing wounds No tub bath. Off-Loading: T Contact Cast to Left Lower Extremity otal Open toe surgical shoe - right foot WOUND #12R: - Foot Wound Laterality: Plantar, Left, Medial Cleanser: Vashe 5.8  (oz) Discharge Instructions: Use vashe 5.8 (oz) as directed Prim Dressing: Promogran Matrix 4.34 (in) ary Discharge Instructions: Moisten w/normal saline or sterile water; Cover wound as directed. Do not remove from wound bed. Secondary Dressing: ABD Pad 5x9 (in/in) Discharge Instructions: Cover with ABD pad 1. I would recommend that the patient should continue to monitor for any signs of infection overall and I believe that she is doing quite well I believe the total contact cast is doing [name] great job working to go and reapply that today. 2. I am going to recommend that the patient should use the collagen dressing as opposed to [facility] I think this may help to get it to heal much more effectively and quickly. 3. I did  reapply the total contact cast today. We will see patient back for reevaluation in 1 week here in the clinic. If anything worsens or changes patient will contact our office for additional recommendations. Electronic Signature(s) Signed: 08/30/2023 3:57:47 PM By: [name] PA-C Entered By: [name] on 08/30/2023 12:57:47 -------------------------------------------------------------------------------- Total Contact Cast Details Patient Name: Date of Service: DIYA, GERVASI 08/30/2023 10:00 [name]: [id number] Patient Account Number: [id number] Date of Birth/Sex: Treating RN: 1976-08-20 (47 y.o. [name] Primary Care Provider: [name] Other Clinician: [name] Referring Provider: Treating Provider/Extender: [name] Weeks in Treatment: 76 T Contact Cast Applied for Wound Assessment: otal Wound #12R Left,Medial,Plantar Foot LYDIANN, BONIFAS [name] ([id number]) [id number] Page 20 of 20 Performed By: Physician [name], PA-C The following information was scribed by: [name] The information was scribed for: [name] Post Procedure Diagnosis Same as  Pre-procedure Electronic Signature(s) Signed: 08/30/2023 5:39:21 PM By: [name] Signed: 08/31/2023 11:25:01 AM By: [name] PA-C Entered By: [name] on 08/30/2023 07:46:57 -------------------------------------------------------------------------------- SuperBill Details Patient Name: Date of Service: Heather Jefferson [name]. 08/30/2023 Medical Record Number: [id number] Patient Account Number: [id number] Date of Birth/Sex: Treating RN: 1976-03-29 (47 y.o. 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 Z79.4 Long term (current) use of insulin N18.6 End stage renal disease Z99.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 L84 Corns and callosities Facility Procedures : CPT4 Code: [id number] 2 Description: 9445 - APPLY TOTAL CONTACT LEG CAST ICD-10 Diagnosis Description L97.528 Non-pressure chronic ulcer of other part of left foot with other specified sever Modifier: ity Quantity: 1 Physician Procedures : CPT4 Code Description Modifier [id number] - WC PHYS APPLY TOTAL CONTACT CAST ICD-10 Diagnosis Description L97.528 Non-pressure chronic ulcer of other part of left foot with other specified severity Quantity: 1 Electronic Signature(s) Signed: 08/30/2023 3:58:45 PM By: [name] PA-C Entered By: [name] on 08/30/2023 12:58:44

## 2023-09-04 ENCOUNTER — Encounter: Payer: MEDICAID | Admitting: Physician Assistant

## 2023-09-04 DIAGNOSIS — E11621 Type 2 diabetes mellitus with foot ulcer: Secondary | ICD-10-CM | POA: Diagnosis not present

## 2023-09-04 NOTE — Progress Notes (Signed)
Heather, FENDERSON [name] ([id number]) [id number] Page 1 of 22 Visit Report for 09/04/2023 Chief Complaint Document Details Patient Name: Date of Service: [name], [name] 09/04/2023 9:00 [name]: [id number] Patient Account Number: [id number] Date of Birth/Sex: Treating RN: 09-01-1976 (47 y.o. [name] Primary Care Provider: [name] Other Clinician: [name] Referring Provider: Treating Provider/Extender: [name] Weeks in Treatment: 22 Information Obtained from: Patient Chief Complaint Left foot ulcer Electronic Signature(s) Signed: 09/04/2023 9:15:54 AM By: [name] PA-C Entered By: [name] on 09/04/2023 09:15:53 -------------------------------------------------------------------------------- Debridement Details Patient Name: Date of Service: [name] 09/04/2023 9:00 [name]: [id number] Patient Account Number: [id number] Date of Birth/Sex: Treating RN: [birth year] (47 y.o. [name] Primary Care Provider: [name] Other Clinician: [name] Referring Provider: Treating Provider/Extender: [name] in Treatment: 76 Debridement Performed for Assessment: Wound #12R Left,Medial,Plantar Foot Performed By: Physician [name], PA-C Debridement Type: Debridement Severity of Tissue Pre Debridement: Fat layer exposed Level of Consciousness (Pre-procedure): Awake and Alert Pre-procedure Verification/Time Out Yes - 09:20 Taken: Start Time: 09:20 Percent of Wound Bed Debrided: 100% T Area Debrided (cm): otal 0.07 Tissue and other material debrided: Viable, Non-Viable, Callus Level: Non-Viable Tissue Debridement Description: Selective/Open Wound Instrument: Curette Bleeding: Minimum Hemostasis Achieved: Pressure Response to Treatment: Procedure was tolerated well Level of Consciousness (Post- Awake and Alert procedure): [name], [name] ([id number]) [id number] Page 2 of 22 Post Debridement Measurements of Total Wound Length: (cm) 0.3 Width: (cm) 0.3 Depth: (cm) 0.2 Volume: (cm) 0.014 Character of Wound/Ulcer Post Debridement: Stable Severity of Tissue Post Debridement: Fat layer exposed Post Procedure Diagnosis Same as Pre-procedure Electronic Signature(s) Signed: 09/04/2023 4:31:01 PM By: [name] PA-C Signed: 09/04/2023 4:35:08 PM By: [name] Signed: 09/06/2023 4:42:48 PM By: Yevonne Pax RN Entered By: [name] on 09/04/2023 09:29:24 -------------------------------------------------------------------------------- HPI Details Patient Name: Date of Service: [name], [name] 09/04/2023 9:00 [name]: [id number] Patient Account Number: [id number] Date of Birth/Sex: Treating RN: 1976/03/27 (47 y.o. [name] Primary Care Provider: [name] Other Clinician: [name] Referring Provider: Treating Provider/Extender: [name] in Treatment: 40 History of Present Illness HPI Description: 01/18/18-She is here for initial evaluation of the left great toe ulcer. She is [name] poor historian in regards to timeframe in detail. She states approximately 4 weeks ago she lacerated her toe on something in the house. She followed up with her primary care who placed her on Bactrim and ultimately [name] second dose of Bactrim prior to coming to wound clinic. She states she has been treating the toe with peroxide, Betadine and [name] Band-Aid. She did not check her blood sugar this morning but checked it yesterday morning it was 327; she is unaware of [name] recent A1c and there are no current records. She saw Dr. she would've orthopedics last week for an old injury to the left ankle, she states he did not see her toe, nor did she bring it to his attention. She smokes approximately 1 pack cigarettes [name] day. Her social situation is concerning, she  arrives this morning with her mother who appears extremely intoxicated/under the influence; her mother was asked to leave the room and be monitored by the patient's grandmother. The patient's aunt then accompanied the patient and the room throughout the rest of the appointment. We had [name] lengthy discussion regarding the deleterious effects of uncontrolled hyperglycemia and smoking as it relates to  wound healing and overall health. She was strongly encouraged to decrease her smoking and get her diabetes under better control. She states she is currently on [name] diet and has cut down her [facility] consumption. The left toe is erythematous, macerated and slightly edematous with malodor present. The edema in her left foot is below her baseline, there is no erythema streaking. We will treat her with Santyl, doxycycline; we have ordered and xray, culture and provided [name] Peg assist surgical shoe and cultured the wound. 01/25/18-She is here in follow-up evaluation for [name] left great toe ulcer and presents with an abscess to her suprapubic area. She states her blood sugars remain elevated, feeling "sick" and if levels are below 250, but she is trying. She has made no attempt to decrease her smoking stating that we "can't take away her food in her cigarettes". She has been compliant with offloading using the PEG assist you. She is using Santyl daily. the culture obtained last week grew staph aureus and Enterococcus faecalis; continues on the doxycycline and Augmentin was added on Monday. The suprapubic area has erythema, no femoral variation, purple discoloration, minimal induration, was accessed with [name] cotton tip applicator with sanguinopurulent drainage, this was cultured, I suspect the current antibiotic treatment will cover and we will not add anything to her current treatment plan. She was advised to go to urgent care or ER with any change in redness, induration or fever. 02/01/18-She is here in follow-up  evaluation for left great toe ulcers and [name] new abdominal abscess from last week. She was able to use packing until earlier this week, where she "forgot it was there". She states she was feeling ill with GI symptoms last week and was not taking her antibiotic. She states her glucose levels have been predominantly less than 200, with occasional levels between 200-250. She thinks this was contributing to her GI symptoms as they have resolved without intervention. There continues to be significant laceration to left toe, otherwise it clinically looks stable/improved. There is now less superficial opening to the lateral aspect of the great toe that was residual blister. We will transition to [facility] to all wounds, she will continue her Augmentin. If there is no change or deterioration next week for reculture. 02/08/18-She is here in follow-up evaluation for left great toe ulcer and abdominal ulcer. There is an improvement in both wounds. She has been wrapping her left toe with coban, not by our direction, which has created an area of discoloration to the medial aspect; she has been advised to NOT use coban secondary to her neuropathy. She states her glucose levels have been high over this last week ranging from 200-350, she continues to smoke. She admits to being less compliant with her offloading shoe. We will continue with same treatment plan and she will follow-up next week. 02/15/18-She is here in follow-up evaluation for left great toe ulcer and abdominal ulcer. The abdominal ulcer is epithelialized. The left great toe ulcer is improved and all injury from last week using the Coban wrap is resolved, the lateral ulcer is healed. She admits to noncompliance with wearing offloading shoe and admits to glucose levels being greater than 300 most of the week. She continues to smoke and expresses no desire to quit. There is one area medially that probes deeper than it has historically, erythema to the toe  and dorsal foot has consistently waxed and waned. There is no overt signs of cellulitis or infection but we will culture the wound  for any occult infection given the new area of depth and erythema. We will hold off on sensitivities for initiation of antibiotic therapy. 02/22/18-She is here in follow up evaluation for left great toe ulcer. There is overall significant improvement in both wound appearance, erythema and edema with changes made last week. She was not initiated on antibiotic therapy. Culture obtained last week showed oxacillin sensitive staph aureus, sensitive to [name], [name] ([id number]) [phone].pdf Page 3 of 22 clindamycin. Clindamycin has been called into the pharmacy but she has been instructed to hold off on initiation secondary to overall clinical improvement and her history of antibiotic intolerance. She has been instructed to contact the clinic with any noted changes/deterioration and the wound, erythema, edema and/or pain. She will follow-up next week. She continues to smoke and her glucose levels remain elevated >250; she admits to compliance with offloading shoe 03/01/18 on evaluation today patient appears to be doing fairly well in regard to her left first toe ulcer. She has been tolerating the dressing changes with the [facility] Dressing without complication and overall this has definitely showed signs of improvement according to records as well is what the patient tells me today. I'm very pleased in that regard. She is having no pain today 03/08/18 She is here for follow up evaluation of [name] left great toe ulcer. She remains non-compliant with glucose control and smoking cessation; glucose levels consistently >200. She states that she got new shoe inserts/peg assist. She admits to compliance with offloading. Since my last evaluation there is significant improvement. We will switch to prisma at this time and she will follow up next week. She is  noted to be tachycardic at this appointment, heart rate 120s; she has [name] history of heart rate 70-130 according to our records. She admits to extreme agitation r/t personal issues; she was advised to monitor her heartrate and contact her physician if it does not return to [name] more normal range (<100). She takes cardizem twice daily. 03/15/18-She is here in follow-up evaluation for left great toe ulcer. She remains noncompliant with glucose control and smoking cessation. She admits to compliance with wearing offloading shoe. The ulcer is improved/stable and we will continue with the same treatment plan and she will follow-up next week 03/22/18-She is here for evaluation for left great toe ulcer. There continues to be significant improvement despite recurrent hyperglycemia (over 500 yesterday) and she continues to smoke. She has been compliant with offloading and we will continue with same treatment plan and she will follow-up next week. 03/29/18-She is here for evaluation for left great toe ulcer. Despite continuing to smoke and uncontrolled diabetes she continues to improve. She is compliant with offloading shoe. We will continue with the same treatment plan and she will follow-up next week 04/05/18- She is here in follow up evaluation for [name] left great toe ulcer; she presents with small pustule to left fifth toe (resembles ant bite). She admits to compliance with wearing offloading shoe; continues to smoke or have uncontrolled blood glucose control. There is more callus than usual with evidence of bleeding; she denies known trauma. 04/12/18-She is here for evaluation of left great toe ulcer. Despite noncompliance with glycemic control and smoking she continues to make improvement. She continues to wear offloading shoe. The pustule, that was identified last week, to the left fifth toe is resolved. She will follow-up in 2 weeks 05/03/18-she is seen in follow-up evaluation for [name] left great toe ulcer. She is  compliant with offloading, otherwise  noncompliant with glycemic control and smoking. She has plateaued and there is minimal improvement noted. We will transition to [facility], replaced the insert to her surgical shoe and she will follow-up in one week 05/10/18- She is here in follow up evaluation for [name] left great toe ulcer. It appears stable despite measurement change. We will continue with same treatment plan and follow up next week. 05/24/18-She is seen in follow-up evaluation for [name] left great toe ulcer. She remains compliant with offloading, has made significant improvement in her diet, decreasing the amount of sugar/soda. She said her recent A1c was 10.9 which is lower than. She did see [name] diabetic nutritionist/educator yesterday. She continues to smoke. We will continue with the same treatment plan and she'll follow-up next week. 05/31/18- She is seen in follow-up evaluation for left great toe ulcer. She continues to remain compliant with offloading, continues to make improvement in her diet, increasing her water and decreasing the amount of sugar/soda. She does continue to smoke with no desire to quit. We will apply Prisma to the depth and Hydrofera Blue over. We have not received insurance authorization for oasis. She will follow up next week. 06/07/18-She is seen in follow-up evaluation for left great toe ulcer. It has stalled according to today's measurements although base appears stable. She says she saw [name] diabetic educator yesterday; her average blood sugars are less than 300 which is an improvement for her. She continues to smoke and states "that's my next step" She continues with water over soda. We will order for xray, culture and reinstate ace wrap compression prior to placing apligraf for next week. She is voicing no complaints or concerns. Her dressing will change to iodoflex over the next week in preparation for apligraf. 06/14/18-She is seen in follow-up evaluation for left great toe  ulcer. Plain film x-ray performed last week was negative for osteomyelitis. Wound culture obtained last week grew strep B and OSSA; she is initiated on keflex and cefdinir today; there is erythema to the toe which could be from ace wrap compression, she has [name] history of wrapping too tight and has has been encouraged to maintain ace wraps that we place today. We will hold off on application of apligraf today, will apply next week after antibiotic therapy has been initiated. She admits today that she has resumed taking [name] shower with her foot/toe submerged in water, she has been reminded to keep foot/toe out of the bath water. She will be seen in follow up next week 06/21/18-she is seen in follow-up evaluation for left great toe ulcer. She is tolerating antibiotic therapy with no GI disturbance. The wound is stable. Apligraf was applied today. She has been decreasing her smoking, only had 4 cigarettes yesterday and 1 today. She continues being more compliant in diabetic diet. She will follow-up next week for evaluation of site, if stable will remove at 2 weeks. 06/28/18- She is here in follow up evalution. Apligraf was placed last week, she states the dressing fell off on Tuesday and she was dressing with hydrofera blue. She is healed and will be discharged from the clinic today. She has been instructed to continue with smoking cessation, continue monitoring glucose levels, offloading for an additional 4 weeks and continue with hydrofera blue for additional two weeks for any possible microscopic opening. Readmission: 08/07/18 on evaluation today patient presents for reevaluation concerning the ulcer of her right great toe. She was previously discharged on 06/28/18 healed. Nonetheless she states that this began to show  signs of drainage she subsequently went to her primary care provider. Subsequently an x-ray was performed on 08/01/18 which was negative. The patient was also placed on antibiotics at that time.  Fortunately they should have been effective for the infection. Nonetheless she's been experiencing some improvement but still has [name] lot of drainage coming from the wound itself. 08/14/18 on evaluation today patient's wound actually does show signs of improvement in regard to the erythema at this point. She has completed the antibiotics. With that being said we did discuss the possibility of placing her in [name] total contact cast as of today although I think that I may want to give this just [name] little bit more time to ensure nothing recurrence as far as her infection is concerned. I do not want to put in the cast and risk infection at that time if things are not completely resolved. With that being said she is gonna require some debridement today. 08/21/18 on evaluation today patient actually appears to be doing okay in regard to her toe ulcer. She's been tolerating the dressing changes without complication. With that being said it does appear that she is ready and in fact I think it's appropriate for [place] to go ahead and initiate the total contact cast today. Nonetheless she will require some sharp debridement to prepare the wound for application. Overall I feel like things have been progressing well but we do need to do something to get this to close more readily. 08/24/18 patient seen today for reevaluation after having had the total contact cast applied on Tuesday. She seems to have done very well the wound appears to be doing great and overall I'm pleased with the progress that she's made. There were no abnormal areas of rubbing from the cast on her lower extremity. 08/30/18 on evaluation today patient actually appears to be completely healed in regard to her plantar toe ulcer. She tells me at this point she's been having [name] lot of issues with the cast. She almost fell [name] couple of times the state shall the step of her dog [name] couple times as well. This is been [name] very frustrating process for her other  nonetheless she has completely healed the wound which is excellent news. Overall there does not appear to be the evidence of infection at this time which is great news. 09/11/18 evaluation today patient presents for follow-up concerning her great toe ulcer on the left which has unfortunately reopened since I last saw her which was only [name] couple of weeks ago. Unfortunately she was not able to get in to get the shoe and potentially the AFO that's gonna be necessary due to her left foot drop. She continues with offloading shoe but this is not enough to prevent her from reopening it appears. When we last had her in the total contact cast she did well from [name] healing standpoint but unfortunately the wound reopened as soon as she came out of the cast within just [name] couple of weeks. 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 This is also part of the reason why she falls on [name] regular basis. I just do not believe that is gonna be safe for [place] to reinitiate the total contact cast as last time we had this on she  fell 3 times one week which is definitely not normal for her. 09/18/18 upon evaluation today the patient actually appears to be doing about the same in regard to her toe ulcer. She did not contact [employer] as I asked her to even though I had given her the prescription. In fact she actually states that she has no idea where the prescription is. She did apparently call [employer] and they told her that all she needed to do was bring the prescription in order to be able to be seen and work on getting the AFO for her left foot. With all that being said she still does not have an appointment and I'm not sure were things stand that regard. I will give her [name] new prescription today in order to contact them to get this set  up. 09/25/18 on evaluation today patient actually appears to be doing about the same in regard to her toes ulcer. She does have [name] small areas which seems to have [name] lot of callous buildup around the edge of the wound which is going to need sharp debridement today. She still is waiting to be scheduled for evaluation with ALIXIS, KUTCH [name] ([id number]) [id number] Page 4 of 22 [employer] for possibility of an AFO. She states there supposed to call her tomorrow to get this set up. Unfortunately it does appear that her foot specifically the toe area is showing signs of erythema. There does not appear to be any systemic infection which is in these good news. 10/02/18 on evaluation today patient actually appears to be doing about the same in regard to her toe ulcer. This really has not done too well although it's not significantly larger it's also not significantly smaller. She has been tolerating the dressing changes without complication. She actually has her appointment with [employer] and [place] tomorrow to hopefully be measured for obtaining and AFO splint. I think this would be helpful preventing this from reoccurring. We had contemplated starting the cast this week although to be honest I am reluctant to do that as she's been having nausea, vomiting, and seizure activity over the past three days. She has [name] history of seizures and have been told is nothing that can be done for these. With that being said I do believe that along with the seizures have the nausea vomiting which upon further questioning doesn't seem to be the normal for her and makes me concerned for the possibility of infection or something else going on. I discussed this with the patient and her mother during the office visit today. I do not feel the wound is effective but maybe something else. The responses this was "this just happens to her at times and we don't know why". They did not seem to be interested in going  to the hospital to have this checked out further. 10/09/18 on evaluation today patient presents for follow-up concerning her ongoing toe ulcer. She has been tolerating the dressing changes without complication. Fortunately there does not appear to be any evidence of infection which is great news however I do think that the patient would benefit from going ahead for with the total contact cast. She's actually in [name] wheelchair today she tells me that she will use her walker if we initiate the cast. I was very specific about the fact that if we were gonna do the cast I wanted to make sure that she was using the walker in order to prevent any falls. She tells me she does not have stairs  that she has to traverse on [name] regular basis at her home. She has not had any seizures since last week again that something that happens to her often she tells me she did talk to [employer] and they said that it may take up to three weeks to get the brace approved for her. Hopefully that will not take that long but nonetheless in the meantime I do think the cast could be of benefit. 10/12/18 on evaluation today patient appears to be doing rather well in regard to her toe ulcer. It's just been [name] few days and already this is significantly improved both as far as overall appearance and size. Fortunately there's no sign of infection. She is here for her first obligatory cast change. 10/19/18 Seen today for follow up and management of left great toe ulcer. Wound continues to show improvement. Noted small open area with seroussang drainage with palpation. Denies any increased pain or recent fevers during visit. She will continue calcium alginate with offloading shoe. Denies any questions or concerns during visit. 10/26/18 on evaluation today patient appears to be doing about the same as when I last saw her in regard to her wound bed. Fortunately there does not appear to be any signs of infection. Unfortunately she continues to have [name]  breakdown in regard to the toe region any time that she is not in the cast. It takes almost no time at all for this to happen. Nonetheless she still has not heard anything from the brace being made by [employer] as to when exactly this will be available to her. Fortunately there is no signs of infection at this time. 10/30/18 on evaluation today patient presents for application of the total contact cast as we just received him this morning. Fortunately we are gonna be able to apply this to her today which is great news. She continues to have no significant pain which is good news. Overall I do feel like things have been improving while she was the cast is when she doesn't have [name] cast that things get worse. She still has not really heard anything from [employer] regarding her brace. 11/02/18 upon evaluation today patient's wound already appears to be doing significantly better which is good news. Fortunately there does not appear to be any signs of infection also good news. Overall I do think the total contact cast as before is helping to heal this area unfortunately it's just not gonna likely keep the area closed and healed without her getting her brace at least. Again the foot drop is [name] significant issue for her. 11/09/18 on evaluation today patient appears to be doing excellent in regard to her toe ulcer which in fact is completely healed. Fortunately we finally got the situation squared away with the paperwork which was needed to proceed with getting her brace approved by Medicaid. I have filled that out unfortunately that information has been sent to the orthopedic office that I worked at 2 1/2 years ago and not tired Current wound care measures. Fortunately she seems to be doing very well at this time. 11/23/18 on evaluation today patient appears to be doing More Poorly Compared to Last Time I Saw Her. At [facility] She Had Completely Healed. Currently she is continuing to have issues with reopening. She  states that she just found out that the brace was approved through Medicaid now she just has to go get measured in order to have this fitted for her and then made. Subsequently she does not  have an appointment for this yet that is going to complicate things we obviously cannot put her back in the cast if we do not have everything measured because they're not gonna be able to measure her foot while she is in the cast. 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 Obviously this is unfortunate and does have me somewhat worried as well. 11/30/18 on evaluation today patient's toe ulcer actually appears to be doing fairly well. The good news is she will be getting her brace in the shoes next week on Wednesday. Hopefully we will be able to get this to heal without having to go back in the cast however she may need the cast in order to get the wound completely heal and then go from there. Fortunately there's no signs of infection at this time. 12/07/18 on evaluation today patient fortunately did receive her brace and she states she could tell this definitely makes her walk better. With that being said she's been having issues with her toe where she noticed yesterday there was [name] lot of tissue that was loosing off this appears to be much larger than what it was previous. She also states that her leg has been read putting much across the top of her foot just about the ankle although this seems to be receiving somewhat. The total area is still red and appears to be someone infected as best I can tell. She is previously taken Bactrim and that may be [name] good option for her today as well. We are gonna see what I wound culture shows as well and I think that this is definitely appropriate. With that being said outside of the culture I still need to initiate something in the interim and that's what I'm gonna go ahead and select  Bactrim is [name] good option for her. 12/14/18 on evaluation today patient appears to be doing better in regard to her left great toe ulcer as compared to last week's evaluation. There's still some erythema although this is significantly improved which is excellent news. Overall I do believe that she is making good progress is still gonna take some time before she is where I would like her to be from the standpoint of being able to place her back into the total contact cast. Hopefully we will be where we need to be by next week. 12/21/18 on evaluation today patient actually appears to be doing poorly in regard to her toe ulcer. She's been tolerating the dressing changes without complication. Fortunately there's no signs of systemic infection although she does have [name] lot of drainage from the toe ulcer and this does seem to be causing some issues at this point. She does have erythema on the distal portion of her toe that appears to be likely cellulitis. 12/28/18 on evaluation today patient actually appears to be doing [name] little better in my pinion in regard to her toe ulcer. With that being said she still does have some evidence of infection at this time and for her culture she had both E. coli as well as enterococcus as organisms noted on evaluation. For that reason I think that though the Keflex likely has treated the E. coli rather well this has really done nothing for the enterococcus. We are going to have to initiate treatment for this specifically. 01/04/19 on evaluation today patient's toe actually appears to be doing better from  the standpoint of infection. She currently would like to see about putting the cash back on I think that this is appropriate as long as she takes care of it and keeps it from getting wet. She is gonna have some drainage we can definitely pass this up with Drawtex and alginate to try to prevent as much drainage as possible from causing the problems. With that being said I do want to  at least try her with the cast between now and Tuesday. If there any issues we can't continue to use it then I will discontinue the use of the cast at that point. 01/08/19 on evaluation today patient actually appears to be doing very well as far as her foot ulcer specifically the great toe on the left is concerned. She did have an area of rubbing on the medial aspect of her left ankle which again is from the cast. Fortunately there's no signs of infection at this point in this appears to be [name] very slight skin breakdown. The patient tells me she felt it rubbing but didn't think it was that bad. Fortunately there is no signs of active infection at this time which is good news. No fevers, chills, nausea, or vomiting noted at this time. 01/15/19 on evaluation today patient actually appears to be doing well in regard to her toe ulcer. Again as previous she seems to do well and she has the cast on which indicates to me that during the time she doesn't have [name] cast on she's putting way too much pressure on this region. Obviously I think that's gonna be an issue as with the current national emergency concerning the Covid-19 Virus it has been recommended that we discontinue the use of total contact casting by the chief medical officer of our company, Dr. Maurine Minister. The reasoning is that if [name] patient becomes sick and cannot come into have the cast removed they could Kloosterman, Judiann [name] ([id number]) [phone].pdf Page 5 of 22 not just leave this on for an additional two weeks. Obviously the hospitals also do not want to receive patient's who are sick into the emergency department to potentially contaminate the region and spread the Covid-19 Virus among other sick individuals within the hospital system. Therefore at this point we are suspending the use of total contact cast until the current emergency subsides. This was all discussed with the patient today as well. 01/22/19 on evaluation today  patient's wound on her left great toe appears to be doing slightly worse than previously noted last week. She tells me that she has been on this quite [name] bit in fact she tells me she's been awake for 38 straight hours. This is due to the fact that she's having to care for grandparents because nobody else will. She has been taking care of them for five the last seven days since I've seen her they both have dementia his is from [name] stroke and her grandmother's was progressive. Nonetheless she states even her mom who knows her condition and situation has only help two of those days to take care of them she's been taking care of the rest. Fortunately there does not appear to be any signs of active infection in regard to her toe at this point although obviously it doesn't look as good as it did previous. I think this is directly related to her not taking off the pressure and friction by way of taking things easy. Though I completely understand what's going on. 01/29/19 on evaluation today patient's  tools are actually appears to be showing some signs of improvement today compared to last week's evaluation as far as not necessarily the overall size of the wound but the fact that she has some new skin growth in between the two ends of the wound opening. Overall I feel like she has done well she states that she had [name] family member give her what sounds to be [name] CAM walker boot which has been helpful as well. 02/05/19 on evaluation today patient's wound bed actually appears to be doing significantly better in regard to her overall appearance of the size of the wound. With that being said she is still having an issue with offloading efficiently enough to get this to close. Apparently there is some signs of infection at this point as well unfortunately. Previously she's done well of Augmentin I really do not see anything that needs to be culture currently but there theme and cellulitis of the foot that I'm seeing I'm gonna  go ahead and place her on an antibiotic today to try to help clear this up. 02/12/2019 on evaluation today patient actually appears to be doing poorly in regard to her overall wound status. She tells me she has been using her offloading shoe but actually comes in today wearing her tennis shoe with the AFO brace. Again as I previously discussed with her this is really not sufficient to allow the area to heal appropriately. Nonetheless she continues to be somewhat noncompliant and I do wonder based on what she has told my nurse in the past as to whether or not [name] good portion of this noncompliance may be recreational drug and alcohol related. She has had [name] history of heroin overdose and this was fairly recently in the past couple of months that have been seeing her. Nonetheless overall I feel like her wound looks significantly worse today compared to what it was previous. She still has significant erythema despite the Augmentin I am not sure that this is an appropriate medication for her infection I am also concerned that the infection may have gone down into her bone. 02/19/19 on evaluation today patient actually appears to be doing about the same in regard to her toe ulcer. Unfortunately she continues to show signs of bone exposure and infection at this point. There does not appear to be any evidence of worsening of the infection but I'm also not really sure that it's getting significantly better. She is on the Augmentin which should be sufficient for the Staphylococcus aureus infection that she has at this point. With that being said she may need IV antibiotics to more appropriately treat this. We did have [name] discussion today about hyperbaric option therapy. 02/28/19 on evaluation today patient actually appears to be doing much worse in regard to the wound on her left great toe as compared to even my previous evaluation last week. Unfortunately this seems to be training in [name] pretty poor direction. Her toe  was actually now starting to angle laterally and I can actually see the entire joint area of the proximal portion of the digit where is the distal portion of the digit again is no longer even in contact with the joint line. Unfortunately there's [name] lot more necrotic tissue around the edge and the toe appears to be showing signs of becoming gangrenous in my pinion. I'm very concerned about were things stand at this point. She did see infectious disease and they are planning to send in [name] prescription for Sivextro for  her and apparently this has been approved. With that being said I don't think she should avoid taking this but at the same time I'm not sure that it's gonna be sufficient to save her toe at this point. She tells me that she still having to care for grandparents which I think is putting quite [name] bit of strain on her foot and specifically the total area and has caused this to break down even to [name] greater degree than would've otherwise been expected. 03/05/19 on evaluation today patient actually appears to be doing quite well in regard to her toe all things considering. She still has bone exposed but there appears to be much less your thing on overall the appearance of the wound and the toe itself is dramatically improved. She still does have some issues currently obviously with infection she did see vascular as well and there concerned that her blood flow to the toad. For that reason they are setting up for an angiogram next week. 03/14/19 on evaluation today patient appears to be doing very poor in regard to her toe and specifically in regard to the ulceration and the fact that she's starting to notice the toe was leaning even more towards the lateral aspect and the complete joint is visible on the proximal aspect of the joint. Nonetheless she's also noted [name] significant odor and the tip of the toe is turning more dark and necrotic appearing. Overall I think she is getting worse not better as far  as this is concerned. For that reason I am recommending at this point that she likely needs to be seen for likely amputation. READMISSION 03/19/2021 This is [name] patient that we cared for in this clinic for [name] prolonged period of time in 2019 and 2020 with [name] left foot and left first toe wound. 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 Excell Seltzer. In December 2021 she had an ulcer on her right great toe as well as the fourth and fifth toes. 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 She also had an angiogram at that time and underwent angioplasty of the right anterior tibial artery. In any case she claims that the wound on the right foot is closed I did not look at this today which was probably an oversight although I think that should be done next week. 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 According to Dr. Bernette Redbird last review that she was out of the area being cared for by another physician but recently came back to his attention. 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 [name] culture of this area showed E. coli apparently before she came back to see Dr. Excell Seltzer she was supposed to be receiving antibiotics but she did not really take them. Nor is she offloading this area at all. Finally her last hemoglobin A1c listed in epic was in March 2022 at 14.1 she says things are [name] lot better since then although I am not sure. 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 She had this wound at the time. During this admission she had E. coli sepsis [name] TEE was negative. 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 She is still [name] heavy cigarette smoker. She has [name] history of bipolar disorder seizure disorder ABI in our clinic was 1.05 6/1; left midfoot in the setting of [name] TMA done previously. Round circular wound with [name] "knuckle" of protruding tissue. The problem is that the knuckle was not attached to any of the surrounding granulation and this probed proximally widely I removed [name] large portion of this tissue. This wound goes with considerable undermining laterally. I do not feel any bone there was no purulence but this is [name] deep wound. 6/8; in spite of the debridement I did last week. She arrives with [name] wound looking exactly the same. [name] protruding "knuckle" of tissue nonadherent to most of the surrounding tissue. There is considerable depth around this from 6-12 o'clock at 2.7 cm and undermining of 1 cm. This does not look overtly infected and the x- ray I did last week was negative for any osseous abnormalities. We have been using silver collagen 6/15; deep tissue culture I did last week showed moderate staph aureus and moderate Pseudomonas. This will definitely require prolonged antibiotic therapy. The pathology on the protuberant area was negative for malignancy fungus etc. 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 We have been using silver collagen. I am going to be prescribing Levaquin for 2 weeks. Her CT scan of the foot is down for 7/5 6/22; CT scan of the foot on 7 5. She says she has hardware in the left leg from her previous fracture. She is on the Levaquin for the deep tissue culture I did that showed methicillin sensitive staph aureus and Pseudomonas. I gave her [name] 2-week supply and she will have another week. She arrives in clinic today with the same protuberant tissue however this  is nonadherent to the tissue surrounding it. I am really at [name] loss to explain this unless there is underlying deep tissue infection 6/29; patient presents for 1 week follow-up. She has been using collagen to the wound bed. She reports taking her antibiotics as prescribed.She has no complaints or issues today. She denies signs of infection. 7/6; patient presents for one week followup. She has been using collagen to the wound bed. She states she is taking Levaquin however at times she is not able [name], [name] ([id number]) [id number] Page 6 of 22 to keep it down. She denies signs of infection. 7/13; patient presents for 1 week follow-up. She has been using silver alginate to the wound bed. She still has nausea when taking her antibiotics. She denies signs of infection. 7/20; patient presents for 1 week follow-up. She has been using silver alginate with gentamicin cream to the wound bed. She denies any issues and has no complaints today. She denies signs of infection. 7/27; patient presents for 1 week follow-up. She continues to use silver alginate with gentamicin cream to the wound bed. She reports starting her antibiotics. She has no issues or complaints. Overall she reports stability to the wound. 8/3; patient presents for 1 week follow-up. She has been using silver alginate with gentamicin cream to the wound bed. She reports completing all antibiotics. She has no issues or complaints today. She denies signs of infection. 8/17; patient presents for 2-week follow-up. He is to use silver alginate to the wound bed. She has no issues or complaints today.  She denies signs of infection. She reports her pain has improved in her foot since last clinic visit 8/24; patient presents for 1 week follow-up. She continues to use silver alginate to the wound bed. She has no issues or complaints. She denies signs of infection. Pain is stable. 9/7; patient presents for follow-up. She  missed her last week appointment due to feeling ill. She continues to use silver alginate. She has [name] new wound to the right lower extremity that is covered in eschar. She states It occurred over the past week and has no idea how it started. She currently denies signs of infection. 9/14; patient presents for follow-up. T the left foot wound she has been using gentamicin cream and silver alginate. T the right lower extremity wound she has o o been keeping this covered and has not obtain Santyl. 9/21; patient presents for follow-up. She reports using gentamicin cream and silver alginate to the left foot and Santyl to the right lower extremity wound. She has no issues or complaints today. She denies signs of infection. 9/28; patient presents for follow-up. She reports [name] new wound to her right heel. She states this occurred [name] few days ago and is progressively gotten worse. She has been trying to clean the area with [name] Q-tip and Santyl. She reports stability in the other 2 wounds. She has been using gentamicin cream and silver alginate to the left foot and Santyl to the right lower extremity wound. 10/12; patient presents for follow-up. She reports improvement to the wound beds. She is seeing vein and vascular to discuss the potential of [name] left BKA. She states they are going to do an arteriogram. She continues to use silver alginate with dressing changes to her wounds. 11/2; patient presents for follow-up. She states she has not been doing dressing changes to the wound beds. She states she is not able to offload the areas. She reports chronic pain to her left foot wound. 11/9; patient presents for follow-up. She came in with only socks on. She states she forgot to put on shoes. It is unclear if she is doing any dressing changes. She currently denies systemic signs of infection. 11/16; patient presents for follow-up. She came again only with socks on. She states she does not wear shoes ever. It is unclear  if she does dressing changes. She currently denies systemic signs of infection. 11/23; patient presents for follow-up. She wore her shoes today. It still unclear exactly what dressing she is using for each wound but she did states she obtained Dakin's solution and has been using this to the left foot wound. She currently denies signs of infection. 11/30; patient presents for follow-up. She has no issues or complaints today. She currently denies signs of infection. 12/7; patient presents for follow-up. She has no issues or complaints today. She has been using Hydrofera Blue to the right heel wound and Dakin solution to the left foot wound. Her right anterior leg wound is healed. She currently denies signs of infection. 12/14; patient presents for follow-up. She has been using Hydrofera Blue to the right heel and Dakin's to the left foot wounds. She has no issues or complaints today. She denies signs of infection. 12/21; patient presents for follow-up. She reports using Hydrofera Blue to the right heel and Dakin's to the left foot wound. She denies signs of infection. 12/28; patient presents for follow-up. She continues to use Dakin's to the left foot wound and Hydrofera Blue to the right heel wound. She  denies signs of infection. 1/4; patient presents for follow-up. She has no issues or complaints today. She denies signs of infection. 1/11; patient presents for follow-up. It is unclear if she has been dressing these wounds over the past week. She currently denies signs of infection. 1/18; patient presents for follow-up. She states she has been using Dakin's wet-to-dry dressings to the left foot. She has been using Hydrofera Blue to the right foot foot wound. She states that the anterior right leg wound has reopened and draining serous fluid. She denies signs of infection. 1/25; patient presents for follow-up. She has no issues or complaints today. 2/1; patient presents for follow-up. She has no  issues or complaints today. She denies signs of infection. 2/8; patient presents for follow-up. She has lost her surgical shoes. She did not have [name] dressing to the right heel wound. She currently denies signs of infection. 2/15; patient presents for follow-up. She reports more pain to the right heel today. She denies purulent drainage Or fever/chills 2/22; patient presents for follow-up. She reports taking clindamycin over the past week. She states that she continues to have pain to her right heel. She reports purulent drainage. Readmission 03/16/2022 Ms. Jennice Mulroney is [name] 47 year old female with [name] past medical history of type 2 diabetes, osteomyelitis to her feet, chronic systolic heart failure and bipolar disorder that presents to the clinic for bilateral feet wounds and right lower extremity wound. She was last seen in our clinic on 12/15/2021. At that time she had purulent drainage coming out of her right plantar foot and I recommended she go to the ED. She states she went to [facility] and has been there for the past 3 months. I cannot see the records. She states she had OR debridement and was on several weeks of IV antibiotics while inpatient. Since discharge she has not been taking care of the wound beds. She had nothing on her feet other than socks today. She currently denies signs of infection. 5/31; patient presents for follow-up. She has been using Dakin's wet-to-dry dressings to the wound beds on her feet bilaterally and antibiotic ointment to the right anterior leg wound. She had [name] wound culture done at last clinic visit that showed moderate Pseudomonas aeruginosa sensitive to ciprofloxacin. She currently denies systemic signs of infection. 6/14; patient presents for follow-up. She received Keystone 5 days ago and has been using this on the wound beds. She states that last week she had to go to the hospital because she had increased warmth and erythema to the right foot. She  was started on 2 oral antibiotics. She states she has been taking these. She currently denies systemic signs of infection. She has no issues or complaints today. 6/21; patient presents for follow-up. She states she has been using Keystone antibiotics to the wound beds. She has no issues or complaints today. She denies signs of infection. 6/28; patient presents for follow-up. She has been using Keystone antibiotics to the wound beds. She has no issues or complaints today. 7/12; patient presents for follow-up. Has been using Keystone antibiotics to the wound beds with calcium alginate. She has no issues or complaints today. She JEANNIE, EVERHART [name] ([id number]) [id number] Page 7 of 22 never followed up with her orthopedic surgeon who did the OR debridement to the right foot. We discussed the total contact cast for the left foot and patient would like to do this next week. 7/19; patient presents for follow-up. She has been using Keystone antibiotics with  calcium alginate to the wound beds. She has no issues or complaints today. Patient is in agreement to do the total contact cast of the left foot today. She knows to return later this week for the obligatory cast change. 05-13-2022 upon evaluation today patient's wound which she has the cast of the left leg actually appears to be doing significantly better. Fortunately I do not see any signs of active infection locally or systemically which is great news and overall I am extremely pleased with where we stand currently. 7/26; patient presents for follow-up. She has [name] cast in place for the past week. She states it irritated her shin. Other than that she tolerated the cast well. She states she would like [name] break for 1 week from the cast. We have been using Keystone antibiotic and Aquacel to both wound beds. She denies signs of infection. 8/2; patient presents for follow-up. She has been using Keystone and Aquacel to the wound beds.  She denies any issues and has no complaints. She is agreeable to have the cast placed today for the left leg. 06-03-2022 upon evaluation today patient appears to be doing well with regard to her wound she saw some signs of improvement which is great news. Fortunately I do not see any evidence of active infection locally or systemically at this time which is great news. No fevers, chills, nausea, vomiting, or diarrhea. 8/16; patient presents for follow-up. She has no issues or complaints today. We have been using Keystone and Aquacel to the wound beds. The left lower extremity is in [name] total contact cast. She is tolerated this well. 8/23; patient presents for follow-up. She has had the total contact cast on the left leg for the past week. Unfortunately this has rubbed and broken down the skin to the medial foot. She currently denies signs of infection. She has been using Keystone antibiotic to the right plantar foot wound. 8/30; patient presents for follow-up. We have held off on the total contact cast for the left leg for the past week. Her wound on the left foot has improved and the previous surrounding breakdown of skin has epithelialized. She has been using Keystone antibiotic to both wound beds. She has no issues or complaints today. She denies signs of infection. 9/6; patient presents for follow-up. She has ordered her's Keystone antibiotic and this is arriving this week. She has been doing Dakin's wet-to-dry dressings to the wound beds. She denies signs of infection. She is agreeable to the total contact cast today. 9/13; patient presents for follow-up. She states that the cast caused her left leg shin to be sore. She would like to take [name] break from the cast this week. She has been using Keystone antibiotic to the right plantar foot wound. She denies signs of infection. 9/20; patient presents for follow-up. She has been using Keystone antibiotics to the wound beds with calcium alginate to the  right foot wound and Hydrofera Blue to the left foot wound. She is agreeable to having the cast placed today. She has been approved for Apligraf and we will order this for next clinic visit. 9/27; patient presents for follow-up. We have been using Keystone antibiotic with Hydrofera Blue to the left foot wound under [name] total contact cast. T the right o foot wound she has been using Keystone antibiotic and calcium alginate. She declines [name] total contact cast today. Apligraf is available for placement and she would like to proceed with this. 07-28-2022 upon evaluation today patient appears to  be doing well currently in regard to her wound. She is actually showing signs of significant improvement which is great news. Fortunately I do not see any evidence of active infection locally nor systemically at this time. She has been seeing Dr. Mikey Bussing and to be honest has been doing very well with the cast. Subsequently she comes in today with [name] cast on and we did reapply that today as well. She did not really want to she try to talk me out of that but I explained that if she wanted to heal this is really the right way to go. Patient voiced understanding. In regard to her right foot this is actually [name] lot better compared to the last time I saw her which is also great news. 10/11; patient presents for follow-up. Apligraf and the total contact cast was placed to the left leg at last clinic visit. She states that her right foot wound had burning pain to it with the placement of Apligraf to this area. She has been doing [place] over this area. She denies signs of infection including increased warmth, erythema or purulent drainage. 11/1; 3-week follow-up. The patient fortunately did not have [name] total contact cast or an Apligraf and on the left foot. She has been using Keystone ABD pads and kerlix and her own running shoes She arrives in clinic today with thick callus and [name] very poor surface on the left foot on the right  nonviable skin subcutaneous tissue and [name] deep probing hole. 11/15; patient missed her last clinic appointment. She states she has not been dressing the wound beds for the past 2 weeks. She states that at she had [name] new roommate but is now going back to live with her mother. Apparently its been [name] distracting 2 weeks. Patient currently denies signs of infection. 11/22; patient presents for follow-up. She states she has been using Keystone antibiotic and Dakin's wet-to-dry dressings to the wound beds. She is agreeable for cast placement today. We had ordered Apligraf however this has not been received by our facility. 11/29; Patient had [name] total contact cast placed at last clinic visit and she tolerated this well. We were using silver alginate under the cast. Patient's been using Keystone antibiotic with Aquacel to the right plantar foot wound. She has no issues or complaints today. Apligraf is available for placement today. Patient would like to proceed with this. 12/6; patient presents for follow-up. She had Apligraf placed in standard fashion last clinic visit under the total contact cast to the left lower extremity. She has been using Keystone antibiotic and Aquacel to the right plantar foot wound. She has no issues or complaints today. 12/13; patient presents for follow-up. She has finished 5 Apligraf placements. Was told she would not qualify for more. We have been doing [name] total contact cast to the left lower extremity. She has been using Keystone antibiotic and Aquacel to the right plantar foot wound. She has no issues or complaints today. 12/20; patient presents for follow-up. We have been using Hydrofera Blue with Keystone antibiotic under [name] total contact cast of the left lower extremity. She reports using Keystone antibiotic and silver alginate to the right heel wound. She has no issues or complaints today. 12/27; patient presents with [name] healthy wound on the left midfoot. We have Apligraf to apply  that to that more also using [name] total contact cast. On the right we are using Keystone and silver alginate. She is offloading the right heel with [name] surgical shoealthough  by her admission she is on her feet quite [name] bit 1/3; patient presents for follow-up. Apligraf was placed to the wound beds last clinic visit. She was placed in [name] total contact cast to the left lower extremity. She declines [name] total contact cast today. She states that her mother is in the hospital and she cannot adequately get around with the cast on. 1/10; patient presents for follow-up. She declined the total contact cast at last clinic visit. Both wounds have declined in appearance. She states that she has been on her feet and not offloading the wound beds. She currently denies signs of infection. 1/17; patient presents for follow-up. She had the total contact cast along with Apligraf placed last week to the left lower extremity. She tolerated this well. She has been using Aquacel Ag and Keystone antibiotic to the right heel wound. She currently denies signs of infection. She has no issues or complaints today. 1/24; patient presents for follow-up. We have been using Apligraf to the left foot wound along with [name] total contact cast. She has done well with this. T the right o heel wound she has been using Aquacel Ag and Keystone antibiotic ointment. She has no issues or complaints today. She denies signs of infection. [name], [name] ([id number]) [id number] Page 8 of 22 1/31; patient presents for follow-up. We have been using Apligraf to the left foot wound along with the total contact cast. She continues to do well with this. To the right heel we have been using Aquacel Ag and Keystone antibiotic ointment. She has no issues or complaints today. 12-01-2022 upon evaluation patient is seen today on my schedule due to the fact that she unfortunately was in the hospital yesterday. Her cast needed to come off the  only reason she is out of the hospital is due to the fact that they would not take it off at the hospital which is somewhat bewildered reading to me to be perfectly honest. I am not certain why this was but either way she was released and then was placed on my schedule today in order to get this off and reapply the total contact casting as appropriate. I do not have an Apligraf for her today it was applied last week and today's actually expired yesterday as there was some scheduling conflicts with her being in the hospital. Nonetheless we do not have that for reapplication today but the good news that she is not draining too much and the Apligraf can go for up to 2 weeks so I am going to go ahead and reapply the total contact casting but we are going to leave the Apligraf in place. 2/14; patient presents for follow-up. T the left leg she has had the total contact cast and Apligraf for the past week. She has had no issues with this. T the o o right heel she has been using antibiotic ointment and Aquacel Ag. 2/21; patient presents for follow-up. We have been using Apligraf and [name] total contact cast to the left lower extremity. She is tolerated this well. Unfortunately she is not approved for any more Apligraf per insurance. She has been using antibiotic ointment and Aquacel Ag to the right foot. She has no issues or complaints today. 2/28; patient presents for follow-up. We have been using Hydrofera Blue and antibiotic ointment under the total contact cast to the left lower extremity. He has been using Aquacel Ag with antibiotic ointment to the right plantar foot. She has no issues or complaints  today. 3/6; patient presents for follow-up. She did not obtain her gentamicin ointment. She has been using Aquacel Ag to the right plantar foot wound. We have been using Hydrofera Blue with antibiotic ointment under the total contact cast to the left lower extremity. She has no issues or complaints today. 3/12;  patient presents for follow-up. She has been using gentamicin ointment and Aquacel Ag to the right plantar foot wound. We have been using Hydrofera Blue with antibiotic ointment under the total contact cast on the left lower extremity foot wound. She has no issues or complaints today. 3/20; patient presents for follow-up. She has been using gentamicin ointment and Aquacel Ag to the right plantar foot wound. We have been using Hydrofera Blue with antibiotic ointment under the total contact cast to the left lower extremity wound. She has no issues or complaints today. 3/27; patient presents for follow-up. We have been using antibiotic ointment and Aquacel Ag to the right plantar foot wound. We have been using Hydrofera Blue with antibiotic ointment under the total contact cast to the left lower extremity. Both wounds are smaller. 01-24-2023 upon evaluation today patient actually appears to be doing better compared to last time I saw her. It has been several weeks since I last saw her but nonetheless I think the total contact casting is doing [name] good job. Fortunately I do not see any evidence of infection or worsening overall which is great news and in general I do believe that she is moving in the appropriate direction. It has been [name] very long road but nonetheless I feel like she is nearing the end at least in regard to the left foot and even the right foot looks much better to be perfectly honest. 01-31-2023 upon evaluation today patient appears to be doing well currently in regard to her wounds. She has been require some sharp debridement which I think is probably bone both sides to remove [name] lot of callus that has built up at this point. I discussed that with her today. Also think total contact cast is doing well for the left foot Emina continue that as well. 4/17; patient presents for follow-up. We have been doing Hydrofera Blue with antibiotic ointment under the total contact cast to the left lower  extremity and Aquacel Ag with antibiotic ointment to the right foot wound. She has been using her offloading heel shoe here. 4/24; patient presents for follow-up. We have been doing Hydrofera Blue with antibiotic ointment under the total contact cast to the left lower extremity and Aquacel Ag with antibiotic ointment to the right foot wound. She has been using her offloading heel shoe here. Wounds are smaller with slough accumulation. 5/1; patient presents for follow-up. We have been doing Hydrofera Blue with antibiotic ointment under the total contact cast to the left lower extremity and Aquacel Ag with antibiotic ointment to the right foot wound. Left foot wound is stable and right foot wound appears smaller. 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 On the left she is using Hydrofera Blue topical antibiotic under [name] total contact cast, on the right plantar heel gentamicin Aquacel Ag. According to our intake nurse both are making slow but steady improvements. 5/15; patient presents for follow-up. We have been doing Hydrofera Blue with antibiotic ointment under the total contact cast on the left and antibiotic ointment with Aquacel Ag on the right. Unfortunately the left foot wound has declined in size and appearance. We also do not have the total contact cast available  in office today. Patient denies signs of infection. 5/22; patient presents for follow-up. We have been doing Hydrofera Blue and antibiotic ointment to the left foot and Aquacel Ag with antibiotic ointment to the right foot. We took [name] break from the cast since we did not have it placed at last week. Wounds look stable if not slightly improved. 03-27-2023 patient presents today she has had this For Most 2 Weeks Because She Was in the Hospital and They Did Not T It off. Again That Is Really Not ake Something That She Could Control but Nonetheless We Are Seeing Her at This Point Today for Reevaluation. We Do Want to Go and See about  Reapply the Cast. Again We Had All Set Everything up to Go Ahead and Do This When We Went to Get the Cast Only to Realize That Not Had Come in and We Were out at the Moment. With That Being [place] Spaniel We Will Get Have T Get the Exam before We Can Reapply the T Contact Casting Going Forward. o otal 6/12; patient presents for follow-up. She has been using Hydrofera Blue to the left foot wound and Aquacel Ag to the right foot wound. No cast was available last week placed on the patient so she has been offloading with her surgical shoe. She has no issues or complaints today. 6/19; patient presents for follow-up. We have been using Hydrofera Blue to the left foot wound under the total contact cast and Aquacel Ag to the right wound. She has no issues or complaints today. The wounds are smaller. 6/26; patient presents for follow-up. We have been using Hydrofera Blue to the left foot wound under the total contact cast and Aquacel Ag to the right foot wound. Wounds are smaller. Patient has no issues or complaints today. 7/3; patient presents for follow-up. We have been using Hydrofera Blue to the left foot wound under the total contact cast and Aquacel Ag to the right foot wound. Wounds Appear smaller. No signs of infection. 7/10; patient presents for follow-up. We have been using Hydrofera Blue to the left foot wound under the total contact cast and Aquacel Ag to the right foot wound. Left foot wound is smaller. Right foot wound is stable. 7/17; patient presents for follow-up. We have been using Hydrofera Blue to the left foot under the total contact cast and Aquacel Ag to the right foot wound. She has no issues or complaints today. 7/24; patient presents for follow-up. We have been using Hydrofera Blue to the left foot under the total contact cast and Aquacel Ag to the right foot wound. Unfortunately she was bit by her dog over the weekend and developed [name] wound to the right lower leg. She has been keeping the  area covered. 7/31; patient presents for follow-up. We have been using Hydrofera Blue to the left foot under the total contact cast and Aquacel Ag to the right foot wound and mupirocin ointment to the right anterior leg wound. She started her oral antibiotics 2 days ago. 8/7; patient presents for follow-up. We have been using Hydrofera Blue and antibiotic ointment to the left foot under the total contact cast and Aquacel Ag to the right foot wound. Antibiotic ointment to the right anterior leg wound. She completed her oral antibiotics. She has no issues or complaints today. Wounds continue to improve albeit slowly. [name], [name] ([id number]) [id number] Page 9 of 22 8/14; 8/14; patient has 3 wounds 1 on the left plantar heel 1 on the right plantar  heel and [name] small wound on the right anterior lower leg. We have been using Hydrofera Blue in the left heel Aquacel on the right heel. The left heel has been in [name] total contact cast 8/21; patient presents for follow-up. We have been using Hydrofera Blue to the left foot wound under the total contact cast. T the right heel patient has been o using Aquacel Ag. T the anterior leg wound antibiotic ointment. All wounds have healed. o 8/28; patient presents for follow-up. She has been using her surgical shoe with offloading felt pads. Her wounds have remained closed. 9/11; patient presents for final follow-up to assure that her wounds have remained closed. Unfortunately the left foot wound has opened again. But she has been wearing her Surgical shoe for offloading with felt pad. 07-14-2023 upon evaluation today patient appears to be doing well currently in regard to her wound. She has been tolerating the dressing changes without complication with total contact casting. I am going to be helping take care of her at this point as she is transferring from Dr. Mikey Bussing to me since Dr. Mikey Bussing is on maternity leave. 07-24-2023 upon  evaluation today patient appears to be doing decently well currently in regard to her wound. She is actually showing signs of improvement in regard to the left foot and I feel like this is much better than last week no debridement even necessary today. We may have to perform some slight debridement come next week we will see how things go. Monitor for any signs of worsening overall and obviously if anything changes she should be letting us know. 08-01-2023 upon evaluation today patient appears to be doing excellent in regard to her feet. The right foot/heel area is completely healed she does have some callus buildup I am going to trim this down [name] little bit I do not want to build up to the point of cracking she is doing well with the urea cream. With regard to the left heel she is doing well with casting I am going to perform some debridement here to clear away before reapplying the cast and I think she is really doing quite well though. 08-07-2023 upon evaluation patient's wound actually showed signs of excellent improvement and actually very pleased with where we stand I do believe that she is making good headway towards complete closure which is great news. 08-14-2023 upon evaluation today patient appears to be doing well currently in regard to her wound. She actually is going require [name] little bit of debridement but fortunately nothing too significant today actually feel like she is doing much better. This is mainly to remove callus on the little slough and biofilm down to good subcutaneous tissue. In general I think that she is making excellent headway here. 08-21-2023 upon evaluation today patient appears to be doing well currently in regard to her wound which is actually showing signs of significant improvement. I am extremely pleased with where things stand I do not see any signs of infection at this time which is great news and overall I do think that we are making headway towards complete  closure which is also excellent news. 08-30-2023 upon evaluation today patient's wound is showing signs of continued improvement. Fortunately I do not see any evidence of worsening overall and I believe that the patient is making excellent headway towards closure. I am extremely pleased with where we stand. 09-04-2023 upon evaluation today patient appears to be doing well currently in regard to her foot ulcer. She  has been tolerating the dressing changes without complication. Fortunately I do not see any signs of active infection locally or systemically at this time which is great news. No fevers, chills, nausea, vomiting, or diarrhea. Electronic Signature(s) Signed: 09/04/2023 1:51:00 PM By: [name] PA-C Entered By: [name] on 09/04/2023 13:51:00 -------------------------------------------------------------------------------- Physical Exam Details Patient Name: Date of Service: HANALEI, MOCKUS 09/04/2023 9:00 [name]: [id number] Patient Account Number: [id number] Date of Birth/Sex: Treating RN: 1976/05/15 (47 y.o. [name] Primary Care Provider: [name] Other Clinician: [name] Referring Provider: Treating Provider/Extender: [name] in Treatment: 16 Constitutional Well-nourished and well-hydrated in no acute distress. Respiratory normal breathing without difficulty. Psychiatric this patient is able to make decisions and demonstrates good insight into disease process. Alert and Oriented x 3. pleasant and cooperative. Notes Upon inspection patient's wound bed actually did require some debridement in regard to the callus as well as slough and biofilm down to good subcutaneous Sabree, Woodrow [name] ([id number]) [phone].pdf Page 10 of 22 tissue. She tolerated the debridement today without complication and postdebridement wound bed actually appears to be doing significantly better at this  time. Electronic Signature(s) Signed: 09/04/2023 1:51:19 PM By: [name] PA-C Entered By: [name] on 09/04/2023 13:51:18 -------------------------------------------------------------------------------- Physician Orders Details Patient Name: Date of Service: [name] 09/04/2023 9:00 [name]: [id number] Patient Account Number: [id number] Date of Birth/Sex: Treating RN: 19-Feb-1976 (47 y.o. 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 Bathing/ Shower/ Hygiene May shower; gently cleanse wound with antibacterial soap, rinse and pat dry prior to dressing wounds No tub bath. Off-Loading Total Contact Cast to Left Lower Extremity Open toe surgical shoe - right foot Wound Treatment Wound #12R - Foot Wound Laterality: Plantar, Left, Medial Cleanser: Vashe 5.8 (oz) 1 x Per Week/30 Days Discharge Instructions: Use vashe 5.8 (oz) as directed Prim Dressing: Promogran Matrix 4.34 (in) 1 x Per Week/30 Days ary Discharge Instructions: Moisten w/normal saline or sterile water and apply into wound bed Prim Dressing: Silvercel Small 2x2 (in/in) 1 x Per Week/30 Days ary Discharge Instructions: Apply Silvercel Small 2x2 (in/in) over wound area Secondary Dressing: Gauze 1  x Per Week/30 Days Discharge Instructions: As directed: dry, moistened with saline or moistened with Dakins Solution Secured With: [employer] Sterile or Non-Sterile 6-ply 4.5x4 (yd/yd) 1 x Per Week/30 Days Discharge Instructions: Apply Kerlix as directed AZAYA, MCCOSKEY [name] ([id number]) [phone].pdf Page 11 of 22 Electronic Signature(s) Signed: 09/04/2023 4:31:01 PM By: [name] PA-C Signed: 09/04/2023 4:35:08 PM By: [name] Entered By: [name] on 09/04/2023 09:31:19 -------------------------------------------------------------------------------- Problem List Details Patient Name: Date of Service: [name], [name]. 09/04/2023 9:00 [name]: [id number] Patient Account Number: [id number] Date of Birth/Sex: Treating RN: 12-29-1975 (47 y.o. [name] Primary Care Provider: [name] Other Clinician: [name] Referring Provider: Treating Provider/Extender: [name]  [name] Weeks in Treatment: 76 Active Problems ICD-10 Encounter Code Description Active Date MDM Diagnosis E11.621 Type 2 diabetes mellitus with foot ulcer 03/16/2022 No Yes L97.528 Non-pressure chronic ulcer of other part of left foot with other specified 03/16/2022 No Yes severity E11.42 Type 2 diabetes mellitus with diabetic polyneuropathy 03/16/2022 No Yes Z79.4 Long term (current) use of insulin 02/22/2023 No Yes N18.6 End stage renal disease 07/14/2023 No Yes Z99.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 07/14/2023 No Yes L84 Corns and callosities 08/01/2023 No Yes Inactive Problems Resolved Problems ICD-10 Code Description Active Date Resolved Date L97.512 Non-pressure chronic ulcer of other part of right foot with fat layer exposed 03/16/2022 03/16/2022 L97.811 Non-pressure chronic ulcer of other part of right lower leg limited to breakdown of skin 03/16/2022 03/16/2022 ADDILYNN, CRAMER [name] ([id number]) [phone].pdf Page 12 of  22 W54.0XXA Bitten by dog, initial encounter 05/17/2023 05/17/2023 S81.801A Unspecified open wound, right lower leg, initial encounter 05/17/2023 05/17/2023 Q03.474 Type 2 diabetes mellitus with other skin ulcer 05/17/2023 05/17/2023 Electronic Signature(s) Signed: 09/04/2023 9:15:50 AM By: [name] PA-C Entered By: [name] on 09/04/2023 09:15:50 -------------------------------------------------------------------------------- Progress Note Details Patient Name: Date of Service: [name] 09/04/2023 9:00 [name]: [id number] Patient Account Number: [id number] Date of Birth/Sex: Treating RN: Mar 29, 1976 (47 y.o. [name] Primary Care Provider: [name] Other Clinician: [name] Referring Provider: Treating Provider/Extender: [name] in Treatment: 89 Subjective Chief Complaint Information obtained from Patient Left foot ulcer History of Present Illness (HPI) 01/18/18-She is here for initial evaluation of the left great toe ulcer. She is [name] poor historian in regards to timeframe in detail. She states approximately 4 weeks ago she lacerated her toe on something in the house. She followed up with her primary care who placed her on Bactrim and ultimately [name] second dose of Bactrim prior to coming to wound clinic. She states she has been treating the toe with peroxide, Betadine and [name] Band-Aid. She did not check her blood sugar this morning but checked it yesterday morning it was 327; she is unaware of [name] recent A1c and there are no current records. She saw Dr. she would've orthopedics last week for an old injury to the left ankle, she states he did not see her toe, nor did she bring it to his attention. She smokes approximately 1 pack cigarettes [name] day. Her social situation is concerning, she arrives this morning with her mother who appears extremely intoxicated/under the influence; her mother was asked to leave the room and be  monitored by the patient's grandmother. The patient's aunt then accompanied the patient and the room throughout the rest of the appointment. We had [name] lengthy discussion regarding the deleterious effects of uncontrolled hyperglycemia and smoking as it relates to wound healing and overall health. She was strongly encouraged to decrease her smoking and get her diabetes under better control. She states she is currently on [name] diet and has cut down her [facility] consumption. The left toe is erythematous, macerated and slightly edematous with malodor present. The edema in her left foot is below her baseline, there is no erythema streaking. We will treat her with Santyl, doxycycline; we have ordered and xray, culture and provided [name] Peg assist surgical shoe and cultured the wound. 01/25/18-She is here in follow-up evaluation for [name] left great toe ulcer and presents with an abscess to her suprapubic area. She states her blood sugars remain elevated, feeling "sick" and if levels are below 250,  but she is trying. She has made no attempt to decrease her smoking stating that we "can't take away her food in her cigarettes". She has been compliant with offloading using the PEG assist you. She is using Santyl daily. the culture obtained last week grew staph aureus and Enterococcus faecalis; continues on the doxycycline and Augmentin was added on Monday. The suprapubic area has erythema, no femoral variation, purple discoloration, minimal induration, was accessed with [name] cotton tip applicator with sanguinopurulent drainage, this was cultured, I suspect the current antibiotic treatment will cover and we will not add anything to her current treatment plan. She was advised to go to urgent care or ER with any change in redness, induration or fever. 02/01/18-She is here in follow-up evaluation for left great toe ulcers and [name] new abdominal abscess from last week. She was able to use packing until earlier this week, where she  "forgot it was there". She states she was feeling ill with GI symptoms last week and was not taking her antibiotic. She states her glucose levels have been predominantly less than 200, with occasional levels between 200-250. She thinks this was contributing to her GI symptoms as they have resolved without intervention. There continues to be significant laceration to left toe, otherwise it clinically looks stable/improved. There is now less superficial opening to the lateral aspect of the great toe that was residual blister. We will transition to [facility] to all wounds, she will continue her Augmentin. If there is no change or deterioration next week for reculture. 02/08/18-She is here in follow-up evaluation for left great toe ulcer and abdominal ulcer. There is an improvement in both wounds. She has been wrapping her left toe with coban, not by our direction, which has created an area of discoloration to the medial aspect; she has been advised to NOT use coban secondary to her neuropathy. She states her glucose levels have been high over this last week ranging from 200-350, she continues to smoke. She admits to being less compliant with her offloading shoe. We will continue with same treatment plan and she will follow-up next week. 02/15/18-She is here in follow-up evaluation for left great toe ulcer and abdominal ulcer. The abdominal ulcer is epithelialized. The left great toe ulcer is improved and all injury from last week using the Coban wrap is resolved, the lateral ulcer is healed. She admits to noncompliance with wearing offloading shoe and admits to glucose levels being greater than 300 most of the week. She continues to smoke and expresses no desire to quit. There is one area medially that probes deeper than it has historically, erythema to the toe and dorsal foot has consistently waxed and waned. There is no overt signs of cellulitis or infection but we will culture the wound for any  occult infection given the new area of depth and erythema. We will hold off on sensitivities for initiation of antibiotic therapy. 02/22/18-She is here in follow up evaluation for left great toe ulcer. There is overall significant improvement in both wound appearance, erythema and edema with changes made last week. She was not initiated on antibiotic therapy. Culture obtained last week showed oxacillin sensitive staph aureus, sensitive to MERNA, WILLMARTH [name] ([id number]) [phone].pdf Page 13 of 22 clindamycin. Clindamycin has been called into the pharmacy but she has been instructed to hold off on initiation secondary to overall clinical improvement and her history of antibiotic intolerance. She has been instructed to contact the clinic with any noted changes/deterioration and the wound,  erythema, edema and/or pain. She will follow-up next week. She continues to smoke and her glucose levels remain elevated >250; she admits to compliance with offloading shoe 03/01/18 on evaluation today patient appears to be doing fairly well in regard to her left first toe ulcer. She has been tolerating the dressing changes with the [facility] Dressing without complication and overall this has definitely showed signs of improvement according to records as well is what the patient tells me today. I'm very pleased in that regard. She is having no pain today 03/08/18 She is here for follow up evaluation of [name] left great toe ulcer. She remains non-compliant with glucose control and smoking cessation; glucose levels consistently >200. She states that she got new shoe inserts/peg assist. She admits to compliance with offloading. Since my last evaluation there is significant improvement. We will switch to prisma at this time and she will follow up next week. She is noted to be tachycardic at this appointment, heart rate 120s; she has [name] history of heart rate 70-130 according to our records. She admits to  extreme agitation r/t personal issues; she was advised to monitor her heartrate and contact her physician if it does not return to [name] more normal range (<100). She takes cardizem twice daily. 03/15/18-She is here in follow-up evaluation for left great toe ulcer. She remains noncompliant with glucose control and smoking cessation. She admits to compliance with wearing offloading shoe. The ulcer is improved/stable and we will continue with the same treatment plan and she will follow-up next week 03/22/18-She is here for evaluation for left great toe ulcer. There continues to be significant improvement despite recurrent hyperglycemia (over 500 yesterday) and she continues to smoke. She has been compliant with offloading and we will continue with same treatment plan and she will follow-up next week. 03/29/18-She is here for evaluation for left great toe ulcer. Despite continuing to smoke and uncontrolled diabetes she continues to improve. She is compliant with offloading shoe. We will continue with the same treatment plan and she will follow-up next week 04/05/18- She is here in follow up evaluation for [name] left great toe ulcer; she presents with small pustule to left fifth toe (resembles ant bite). She admits to compliance with wearing offloading shoe; continues to smoke or have uncontrolled blood glucose control. There is more callus than usual with evidence of bleeding; she denies known trauma. 04/12/18-She is here for evaluation of left great toe ulcer. Despite noncompliance with glycemic control and smoking she continues to make improvement. She continues to wear offloading shoe. The pustule, that was identified last week, to the left fifth toe is resolved. She will follow-up in 2 weeks 05/03/18-she is seen in follow-up evaluation for [name] left great toe ulcer. She is compliant with offloading, otherwise noncompliant with glycemic control and smoking. She has plateaued and there is minimal improvement noted. We  will transition to [facility], replaced the insert to her surgical shoe and she will follow-up in one week 05/10/18- She is here in follow up evaluation for [name] left great toe ulcer. It appears stable despite measurement change. We will continue with same treatment plan and follow up next week. 05/24/18-She is seen in follow-up evaluation for [name] left great toe ulcer. She remains compliant with offloading, has made significant improvement in her diet, decreasing the amount of sugar/soda. She said her recent A1c was 10.9 which is lower than. She did see [name] diabetic nutritionist/educator yesterday. She continues to smoke. We will continue with the  same treatment plan and she'll follow-up next week. 05/31/18- She is seen in follow-up evaluation for left great toe ulcer. She continues to remain compliant with offloading, continues to make improvement in her diet, increasing her water and decreasing the amount of sugar/soda. She does continue to smoke with no desire to quit. We will apply Prisma to the depth and Hydrofera Blue over. We have not received insurance authorization for oasis. She will follow up next week. 06/07/18-She is seen in follow-up evaluation for left great toe ulcer. It has stalled according to today's measurements although base appears stable. She says she saw [name] diabetic educator yesterday; her average blood sugars are less than 300 which is an improvement for her. She continues to smoke and states "that's my next step" She continues with water over soda. We will order for xray, culture and reinstate ace wrap compression prior to placing apligraf for next week. She is voicing no complaints or concerns. Her dressing will change to iodoflex over the next week in preparation for apligraf. 06/14/18-She is seen in follow-up evaluation for left great toe ulcer. Plain film x-ray performed last week was negative for osteomyelitis. Wound culture obtained last week grew strep B and OSSA; she is  initiated on keflex and cefdinir today; there is erythema to the toe which could be from ace wrap compression, she has [name] history of wrapping too tight and has has been encouraged to maintain ace wraps that we place today. We will hold off on application of apligraf today, will apply next week after antibiotic therapy has been initiated. She admits today that she has resumed taking [name] shower with her foot/toe submerged in water, she has been reminded to keep foot/toe out of the bath water. She will be seen in follow up next week 06/21/18-she is seen in follow-up evaluation for left great toe ulcer. She is tolerating antibiotic therapy with no GI disturbance. The wound is stable. Apligraf was applied today. She has been decreasing her smoking, only had 4 cigarettes yesterday and 1 today. She continues being more compliant in diabetic diet. She will follow-up next week for evaluation of site, if stable will remove at 2 weeks. 06/28/18- She is here in follow up evalution. Apligraf was placed last week, she states the dressing fell off on Tuesday and she was dressing with hydrofera blue. She is healed and will be discharged from the clinic today. She has been instructed to continue with smoking cessation, continue monitoring glucose levels, offloading for an additional 4 weeks and continue with hydrofera blue for additional two weeks for any possible microscopic opening. Readmission: 08/07/18 on evaluation today patient presents for reevaluation concerning the ulcer of her right great toe. She was previously discharged on 06/28/18 healed. Nonetheless she states that this began to show signs of drainage she subsequently went to her primary care provider. Subsequently an x-ray was performed on 08/01/18 which was negative. The patient was also placed on antibiotics at that time. Fortunately they should have been effective for the infection. Nonetheless she's been experiencing some improvement but still has [name] lot of  drainage coming from the wound itself. 08/14/18 on evaluation today patient's wound actually does show signs of improvement in regard to the erythema at this point. She has completed the antibiotics. With that being said we did discuss the possibility of placing her in [name] total contact cast as of today although I think that I may want to give this just [name] little bit more time to ensure nothing  recurrence as far as her infection is concerned. I do not want to put in the cast and risk infection at that time if things are not completely resolved. With that being said she is gonna require some debridement today. 08/21/18 on evaluation today patient actually appears to be doing okay in regard to her toe ulcer. She's been tolerating the dressing changes without complication. With that being said it does appear that she is ready and in fact I think it's appropriate for [place] to go ahead and initiate the total contact cast today. Nonetheless she will require some sharp debridement to prepare the wound for application. Overall I feel like things have been progressing well but we do need to do something to get this to close more readily. 08/24/18 patient seen today for reevaluation after having had the total contact cast applied on Tuesday. She seems to have done very well the wound appears to be doing great and overall I'm pleased with the progress that she's made. There were no abnormal areas of rubbing from the cast on her lower extremity. 08/30/18 on evaluation today patient actually appears to be completely healed in regard to her plantar toe ulcer. She tells me at this point she's been having [name] lot of issues with the cast. She almost fell [name] couple of times the state shall the step of her dog [name] couple times as well. This is been [name] very frustrating process for her other nonetheless she has completely healed the wound which is excellent news. Overall there does not appear to be the evidence of infection at this  time which is great news. 09/11/18 evaluation today patient presents for follow-up concerning her great toe ulcer on the left which has unfortunately reopened since I last saw her which was only [name] couple of weeks ago. Unfortunately she was not able to get in to get the shoe and potentially the AFO that's gonna be necessary due to her left foot drop. She continues with offloading shoe but this is not enough to prevent her from reopening it appears. When we last had her in the total contact cast she did well from [name] healing standpoint but unfortunately the wound reopened as soon as she came out of the cast within just [name] couple of weeks. Right now the biggest concern is that I do believe the foot drop is leading to the issue and this is gonna continue to be an issue unfortunately until we get things under control as far as the walking anomaly is concerned with the foot drop. This is also part of the reason why she falls on [name] regular basis. I just do not believe that is gonna be safe for [place] to reinitiate the total contact cast as last time we had this on she fell 3 times one week which is definitely not normal for her. 09/18/18 upon evaluation today the patient actually appears to be doing about the same in regard to her toe ulcer. She did not contact [facility] as I asked her to even though I had given her the prescription. In fact she actually states that she has no idea where the prescription is. She did apparently call [facility] and they told her that all she needed to do was bring the prescription in order to be able to be seen and work on getting the AFO for her left foot. With all that being said she still does not have an appointment and I'm not sure were things stand that regard. I  will give her [name] new prescription today in order to contact them to get this set up. 09/25/18 on evaluation today patient actually appears to be doing about the same in regard to her toes ulcer. She does have [name] small areas  which seems to have [name] lot of callous buildup around the edge of the wound which is going to need sharp debridement today. She still is waiting to be scheduled for evaluation with [name], [name] ([id number]) [id number] Page 14 of 22 [facility] for possibility of an AFO. She states there supposed to call her tomorrow to get this set up. Unfortunately it does appear that her foot specifically the toe area is showing signs of erythema. There does not appear to be any systemic infection which is in these good news. 10/02/18 on evaluation today patient actually appears to be doing about the same in regard to her toe ulcer. This really has not done too well although it's not significantly larger it's also not significantly smaller. She has been tolerating the dressing changes without complication. She actually has her appointment with [facility] and [place] tomorrow to hopefully be measured for obtaining and AFO splint. I think this would be helpful preventing this from reoccurring. We had contemplated starting the cast this week although to be honest I am reluctant to do that as she's been having nausea, vomiting, and seizure activity over the past three days. She has [name] history of seizures and have been told is nothing that can be done for these. With that being said I do believe that along with the seizures have the nausea vomiting which upon further questioning doesn't seem to be the normal for her and makes me concerned for the possibility of infection or something else going on. I discussed this with the patient and her mother during the office visit today. I do not feel the wound is effective but maybe something else. The responses this was "this just happens to her at times and we don't know why". They did not seem to be interested in going to the hospital to have this checked out further. 10/09/18 on evaluation today patient presents for follow-up concerning her ongoing toe  ulcer. She has been tolerating the dressing changes without complication. Fortunately there does not appear to be any evidence of infection which is great news however I do think that the patient would benefit from going ahead for with the total contact cast. She's actually in [name] wheelchair today she tells me that she will use her walker if we initiate the cast. I was very specific about the fact that if we were gonna do the cast I wanted to make sure that she was using the walker in order to prevent any falls. She tells me she does not have stairs that she has to traverse on [name] regular basis at her home. She has not had any seizures since last week again that something that happens to her often she tells me she did talk to [employer] and they said that it may take up to three weeks to get the brace approved for her. Hopefully that will not take that long but nonetheless in the meantime I do think the cast could be of benefit. 10/12/18 on evaluation today patient appears to be doing rather well in regard to her toe ulcer. It's just been [name] few days and already this is significantly improved both as far as overall appearance and size. Fortunately there's no sign of infection. She is here for  her first obligatory cast change. 10/19/18 Seen today for follow up and management of left great toe ulcer. Wound continues to show improvement. Noted small open area with seroussang drainage with palpation. Denies any increased pain or recent fevers during visit. She will continue calcium alginate with offloading shoe. Denies any questions or concerns during visit. 10/26/18 on evaluation today patient appears to be doing about the same as when I last saw her in regard to her wound bed. Fortunately there does not appear to be any signs of infection. Unfortunately she continues to have [name] breakdown in regard to the toe region any time that she is not in the cast. It takes almost no time at all for this to happen. Nonetheless  she still has not heard anything from the brace being made by [employer] as to when exactly this will be available to her. Fortunately there is no signs of infection at this time. 10/30/18 on evaluation today patient presents for application of the total contact cast as we just received him this morning. Fortunately we are gonna be able to apply this to her today which is great news. She continues to have no significant pain which is good news. Overall I do feel like things have been improving while she was the cast is when she doesn't have [name] cast that things get worse. She still has not really heard anything from [employer] regarding her brace. 11/02/18 upon evaluation today patient's wound already appears to be doing significantly better which is good news. Fortunately there does not appear to be any signs of infection also good news. Overall I do think the total contact cast as before is helping to heal this area unfortunately it's just not gonna likely keep the area closed and healed without her getting her brace at least. Again the foot drop is [name] significant issue for her. 11/09/18 on evaluation today patient appears to be doing excellent in regard to her toe ulcer which in fact is completely healed. Fortunately we finally got the situation squared away with the paperwork which was needed to proceed with getting her brace approved by Medicaid. I have filled that out unfortunately that information has been sent to the orthopedic office that I worked at 2 1/2 years ago and not tired Current wound care measures. Fortunately she seems to be doing very well at this time. 11/23/18 on evaluation today patient appears to be doing More Poorly Compared to Last Time I Saw Her. At [facility] She Had Completely Healed. Currently she is continuing to have issues with reopening. She states that she just found out that the brace was approved through Medicaid now she just has to go get measured in order to have this fitted  for her and then made. Subsequently she does not have an appointment for this yet that is going to complicate things we obviously cannot put her back in the cast if we do not have everything measured because they're not gonna be able to measure her foot while she is in the cast. 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 Obviously this is unfortunate and does have me somewhat worried as well. 11/30/18 on evaluation today patient's toe ulcer actually appears to be doing fairly well. The good news is she will be getting her brace in the shoes next week on Wednesday. Hopefully we will be able to get this to  heal without having to go back in the cast however she may need the cast in order to get the wound completely heal and then go from there. Fortunately there's no signs of infection at this time. 12/07/18 on evaluation today patient fortunately did receive her brace and she states she could tell this definitely makes her walk better. With that being said she's been having issues with her toe where she noticed yesterday there was [name] lot of tissue that was loosing off this appears to be much larger than what it was previous. She also states that her leg has been read putting much across the top of her foot just about the ankle although this seems to be receiving somewhat. The total area is still red and appears to be someone infected as best I can tell. She is previously taken Bactrim and that may be [name] good option for her today as well. We are gonna see what I wound culture shows as well and I think that this is definitely appropriate. With that being said outside of the culture I still need to initiate something in the interim and that's what I'm gonna go ahead and select Bactrim is [name] good option for her. 12/14/18 on evaluation today patient appears to be doing better in regard to her left great toe ulcer as  compared to last week's evaluation. There's still some erythema although this is significantly improved which is excellent news. Overall I do believe that she is making good progress is still gonna take some time before she is where I would like her to be from the standpoint of being able to place her back into the total contact cast. Hopefully we will be where we need to be by next week. 12/21/18 on evaluation today patient actually appears to be doing poorly in regard to her toe ulcer. She's been tolerating the dressing changes without complication. Fortunately there's no signs of systemic infection although she does have [name] lot of drainage from the toe ulcer and this does seem to be causing some issues at this point. She does have erythema on the distal portion of her toe that appears to be likely cellulitis. 12/28/18 on evaluation today patient actually appears to be doing [name] little better in my pinion in regard to her toe ulcer. With that being said she still does have some evidence of infection at this time and for her culture she had both E. coli as well as enterococcus as organisms noted on evaluation. For that reason I think that though the Keflex likely has treated the E. coli rather well this has really done nothing for the enterococcus. We are going to have to initiate treatment for this specifically. 01/04/19 on evaluation today patient's toe actually appears to be doing better from the standpoint of infection. She currently would like to see about putting the cash back on I think that this is appropriate as long as she takes care of it and keeps it from getting wet. She is gonna have some drainage we can definitely pass this up with Drawtex and alginate to try to prevent as much drainage as possible from causing the problems. With that being said I do want to at least try her with the cast between now and Tuesday. If there any issues we can't continue to use it then I will discontinue the use  of the cast at that point. 01/08/19 on evaluation today patient actually appears to be doing very well as far as her  foot ulcer specifically the great toe on the left is concerned. She did have an area of rubbing on the medial aspect of her left ankle which again is from the cast. Fortunately there's no signs of infection at this point in this appears to be [name] very slight skin breakdown. The patient tells me she felt it rubbing but didn't think it was that bad. Fortunately there is no signs of active infection at this time which is good news. No fevers, chills, nausea, or vomiting noted at this time. 01/15/19 on evaluation today patient actually appears to be doing well in regard to her toe ulcer. Again as previous she seems to do well and she has the cast on which indicates to me that during the time she doesn't have [name] cast on she's putting way too much pressure on this region. Obviously I think that's gonna be an issue as with the current national emergency concerning the Covid-19 Virus it has been recommended that we discontinue the use of total contact casting by the chief medical officer of our company, Dr. Maurine Minister. The reasoning is that if [name] patient becomes sick and cannot come into have the cast removed they could Tyminski, Kalysta [name] ([id number]) [phone].pdf Page 15 of 22 not just leave this on for an additional two weeks. Obviously the hospitals also do not want to receive patient's who are sick into the emergency department to potentially contaminate the region and spread the Covid-19 Virus among other sick individuals within the hospital system. Therefore at this point we are suspending the use of total contact cast until the current emergency subsides. This was all discussed with the patient today as well. 01/22/19 on evaluation today patient's wound on her left great toe appears to be doing slightly worse than previously noted last week. She tells me that she has been on  this quite [name] bit in fact she tells me she's been awake for 38 straight hours. This is due to the fact that she's having to care for grandparents because nobody else will. She has been taking care of them for five the last seven days since I've seen her they both have dementia his is from [name] stroke and her grandmother's was progressive. Nonetheless she states even her mom who knows her condition and situation has only help two of those days to take care of them she's been taking care of the rest. Fortunately there does not appear to be any signs of active infection in regard to her toe at this point although obviously it doesn't look as good as it did previous. I think this is directly related to her not taking off the pressure and friction by way of taking things easy. Though I completely understand what's going on. 01/29/19 on evaluation today patient's tools are actually appears to be showing some signs of improvement today compared to last week's evaluation as far as not necessarily the overall size of the wound but the fact that she has some new skin growth in between the two ends of the wound opening. Overall I feel like she has done well she states that she had [name] family member give her what sounds to be [name] CAM walker boot which has been helpful as well. 02/05/19 on evaluation today patient's wound bed actually appears to be doing significantly better in regard to her overall appearance of the size of the wound. With that being said she is still having an issue with offloading efficiently enough to get this to  close. Apparently there is some signs of infection at this point as well unfortunately. Previously she's done well of Augmentin I really do not see anything that needs to be culture currently but there theme and cellulitis of the foot that I'm seeing I'm gonna go ahead and place her on an antibiotic today to try to help clear this up. 02/12/2019 on evaluation today patient actually appears to be  doing poorly in regard to her overall wound status. She tells me she has been using her offloading shoe but actually comes in today wearing her tennis shoe with the AFO brace. Again as I previously discussed with her this is really not sufficient to allow the area to heal appropriately. Nonetheless she continues to be somewhat noncompliant and I do wonder based on what she has told my nurse in the past as to whether or not [name] good portion of this noncompliance may be recreational drug and alcohol related. She has had [name] history of heroin overdose and this was fairly recently in the past couple of months that have been seeing her. Nonetheless overall I feel like her wound looks significantly worse today compared to what it was previous. She still has significant erythema despite the Augmentin I am not sure that this is an appropriate medication for her infection I am also concerned that the infection may have gone down into her bone. 02/19/19 on evaluation today patient actually appears to be doing about the same in regard to her toe ulcer. Unfortunately she continues to show signs of bone exposure and infection at this point. There does not appear to be any evidence of worsening of the infection but I'm also not really sure that it's getting significantly better. She is on the Augmentin which should be sufficient for the Staphylococcus aureus infection that she has at this point. With that being said she may need IV antibiotics to more appropriately treat this. We did have [name] discussion today about hyperbaric option therapy. 02/28/19 on evaluation today patient actually appears to be doing much worse in regard to the wound on her left great toe as compared to even my previous evaluation last week. Unfortunately this seems to be training in [name] pretty poor direction. Her toe was actually now starting to angle laterally and I can actually see the entire joint area of the proximal portion of the digit where is the  distal portion of the digit again is no longer even in contact with the joint line. Unfortunately there's [name] lot more necrotic tissue around the edge and the toe appears to be showing signs of becoming gangrenous in my pinion. I'm very concerned about were things stand at this point. She did see infectious disease and they are planning to send in [name] prescription for Sivextro for her and apparently this has been approved. With that being said I don't think she should avoid taking this but at the same time I'm not sure that it's gonna be sufficient to save her toe at this point. She tells me that she still having to care for grandparents which I think is putting quite [name] bit of strain on her foot and specifically the total area and has caused this to break down even to [name] greater degree than would've otherwise been expected. 03/05/19 on evaluation today patient actually appears to be doing quite well in regard to her toe all things considering. She still has bone exposed but there appears to be much less your thing on overall the appearance  of the wound and the toe itself is dramatically improved. She still does have some issues currently obviously with infection she did see vascular as well and there concerned that her blood flow to the toad. For that reason they are setting up for an angiogram next week. 03/14/19 on evaluation today patient appears to be doing very poor in regard to her toe and specifically in regard to the ulceration and the fact that she's starting to notice the toe was leaning even more towards the lateral aspect and the complete joint is visible on the proximal aspect of the joint. Nonetheless she's also noted [name] significant odor and the tip of the toe is turning more dark and necrotic appearing. Overall I think she is getting worse not better as far as this is concerned. For that reason I am recommending at this point that she likely needs to be seen for likely  amputation. READMISSION 03/19/2021 This is [name] patient that we cared for in this clinic for [name] prolonged period of time in 2019 and 2020 with [name] left foot and left first toe wound. 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 Excell Seltzer. In December 2021 she had an ulcer on her right great toe as well as the fourth and fifth toes. 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 She also had an angiogram at that time and underwent angioplasty of the right anterior tibial artery. In any case she claims that the wound on the right foot is closed I did not look at this today which was probably an oversight although I think that should be done next week. 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 According to Dr. Bernette Redbird last review that she was out of the area being cared for by another physician but recently came back to his attention. 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 [name] culture of this area showed E. coli apparently before she came back to see Dr. Excell Seltzer she was supposed to be receiving antibiotics but she did not really take them. Nor is she offloading this area at all. Finally her last hemoglobin A1c listed in epic was in March 2022 at 14.1 she says things are [name] lot better since then although I am not sure. 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 She had this wound at the time. During this admission she had E. coli sepsis [name] TEE was negative. 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 She is still [name] heavy cigarette smoker. She has [name] history of bipolar disorder seizure disorder ABI in our clinic was 1.05 6/1; left midfoot in the setting of [name] TMA done previously. Round circular wound with [name] "knuckle" of protruding tissue. The problem is that the knuckle was not attached to any of the surrounding granulation and this probed proximally widely I removed [name] large portion of this tissue. This wound goes with considerable undermining laterally. I do not feel any bone there was no purulence but this is [name] deep wound. 6/8; in spite of  the debridement I did last week. She arrives with [name] wound looking exactly the same. [name] protruding "knuckle" of tissue nonadherent to most of the surrounding tissue. There is considerable depth around this from 6-12 o'clock at 2.7 cm and undermining of 1 cm. This does not look overtly infected and the x- ray I did last week was negative for any osseous abnormalities. We have been using silver collagen 6/15; deep tissue culture I did last week showed moderate staph aureus and moderate Pseudomonas. This will definitely require prolonged antibiotic therapy. The pathology on the protuberant area was negative for malignancy fungus etc. 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 We have been using silver collagen. I am going to be prescribing Levaquin for 2 weeks. Her CT scan of the foot is down for 7/5 6/22; CT scan of the foot on 7 5. She says she has hardware in the left leg from her previous fracture. She is on the Levaquin for the deep tissue culture I did that showed methicillin sensitive staph aureus and Pseudomonas. I gave her [name] 2-week supply and she will have another week. She arrives in clinic today with the same protuberant tissue however this is nonadherent to the tissue surrounding it. I am really at [name] loss to explain this unless there is underlying deep  tissue infection 6/29; patient presents for 1 week follow-up. She has been using collagen to the wound bed. She reports taking her antibiotics as prescribed.She has no complaints or issues today. She denies signs of infection. 7/6; patient presents for one week followup. She has been using collagen to the wound bed. She states she is taking Levaquin however at times she is not able SARI, [name] ([id number]) [id number] Page 16 of 22 to keep it down. She denies signs of infection. 7/13; patient presents for 1 week follow-up. She has been using silver alginate to the wound bed. She still has nausea when taking her antibiotics. She denies signs of infection. 7/20; patient presents for 1 week follow-up. She has been using silver alginate with gentamicin cream to the wound bed. She denies any issues and has no complaints today. She denies signs of infection. 7/27; patient presents for 1 week follow-up. She continues to use silver alginate with gentamicin cream to the wound bed. She reports starting her antibiotics. She has no issues or complaints. Overall she reports stability to the wound. 8/3; patient presents for 1 week follow-up. She has been using silver alginate with gentamicin cream to the wound bed. She reports completing all antibiotics. She has no issues or complaints today. She denies signs of infection. 8/17; patient presents for 2-week follow-up. He is to use silver alginate to the wound bed. She has no issues or complaints today. She denies signs of infection. She reports her pain has improved in her foot since last clinic visit 8/24; patient presents for 1 week follow-up. She continues to use silver alginate to the wound bed. She has no issues or complaints. She denies signs of infection. Pain is stable. 9/7; patient presents for follow-up. She missed her last week appointment due to feeling ill. She continues to use silver alginate. She has [name] new wound to  the right lower extremity that is covered in eschar. She states It occurred over the past week and has no idea how it started. She currently denies signs of infection. 9/14; patient presents for follow-up. T the left foot wound she has been using gentamicin cream  and silver alginate. T the right lower extremity wound she has o o been keeping this covered and has not obtain Santyl. 9/21; patient presents for follow-up. She reports using gentamicin cream and silver alginate to the left foot and Santyl to the right lower extremity wound. She has no issues or complaints today. She denies signs of infection. 9/28; patient presents for follow-up. She reports [name] new wound to her right heel. She states this occurred [name] few days ago and is progressively gotten worse. She has been trying to clean the area with [name] Q-tip and Santyl. She reports stability in the other 2 wounds. She has been using gentamicin cream and silver alginate to the left foot and Santyl to the right lower extremity wound. 10/12; patient presents for follow-up. She reports improvement to the wound beds. She is seeing vein and vascular to discuss the potential of [name] left BKA. She states they are going to do an arteriogram. She continues to use silver alginate with dressing changes to her wounds. 11/2; patient presents for follow-up. She states she has not been doing dressing changes to the wound beds. She states she is not able to offload the areas. She reports chronic pain to her left foot wound. 11/9; patient presents for follow-up. She came in with only socks on. She states she forgot to put on shoes. It is unclear if she is doing any dressing changes. She currently denies systemic signs of infection. 11/16; patient presents for follow-up. She came again only with socks on. She states she does not wear shoes ever. It is unclear if she does dressing changes. She currently denies systemic signs of infection. 11/23; patient presents for  follow-up. She wore her shoes today. It still unclear exactly what dressing she is using for each wound but she did states she obtained Dakin's solution and has been using this to the left foot wound. She currently denies signs of infection. 11/30; patient presents for follow-up. She has no issues or complaints today. She currently denies signs of infection. 12/7; patient presents for follow-up. She has no issues or complaints today. She has been using Hydrofera Blue to the right heel wound and Dakin solution to the left foot wound. Her right anterior leg wound is healed. She currently denies signs of infection. 12/14; patient presents for follow-up. She has been using Hydrofera Blue to the right heel and Dakin's to the left foot wounds. She has no issues or complaints today. She denies signs of infection. 12/21; patient presents for follow-up. She reports using Hydrofera Blue to the right heel and Dakin's to the left foot wound. She denies signs of infection. 12/28; patient presents for follow-up. She continues to use Dakin's to the left foot wound and Hydrofera Blue to the right heel wound. She denies signs of infection. 1/4; patient presents for follow-up. She has no issues or complaints today. She denies signs of infection. 1/11; patient presents for follow-up. It is unclear if she has been dressing these wounds over the past week. She currently denies signs of infection. 1/18; patient presents for follow-up. She states she has been using Dakin's wet-to-dry dressings to the left foot. She has been using Hydrofera Blue to the right foot foot wound. She states that the anterior right leg wound has reopened and draining serous fluid. She denies signs of infection. 1/25; patient presents for follow-up. She has no issues or complaints today. 2/1; patient presents for follow-up. She has no issues or complaints today. She denies signs of  infection. 2/8; patient presents for follow-up. She has lost her  surgical shoes. She did not have [name] dressing to the right heel wound. She currently denies signs of infection. 2/15; patient presents for follow-up. She reports more pain to the right heel today. She denies purulent drainage Or fever/chills 2/22; patient presents for follow-up. She reports taking clindamycin over the past week. She states that she continues to have pain to her right heel. She reports purulent drainage. Readmission 03/16/2022 Ms. Tiena Hires is [name] 47 year old female with [name] past medical history of type 2 diabetes, osteomyelitis to her feet, chronic systolic heart failure and bipolar disorder that presents to the clinic for bilateral feet wounds and right lower extremity wound. She was last seen in our clinic on 12/15/2021. At that time she had purulent drainage coming out of her right plantar foot and I recommended she go to the ED. She states she went to [facility] and has been there for the past 3 months. I cannot see the records. She states she had OR debridement and was on several weeks of IV antibiotics while inpatient. Since discharge she has not been taking care of the wound beds. She had nothing on her feet other than socks today. She currently denies signs of infection. 5/31; patient presents for follow-up. She has been using Dakin's wet-to-dry dressings to the wound beds on her feet bilaterally and antibiotic ointment to the right anterior leg wound. She had [name] wound culture done at last clinic visit that showed moderate Pseudomonas aeruginosa sensitive to ciprofloxacin. She currently denies systemic signs of infection. 6/14; patient presents for follow-up. She received Keystone 5 days ago and has been using this on the wound beds. She states that last week she had to go to the hospital because she had increased warmth and erythema to the right foot. She was started on 2 oral antibiotics. She states she has been taking these. She currently denies systemic signs of  infection. She has no issues or complaints today. 6/21; patient presents for follow-up. She states she has been using Keystone antibiotics to the wound beds. She has no issues or complaints today. She denies signs of infection. 6/28; patient presents for follow-up. She has been using Keystone antibiotics to the wound beds. She has no issues or complaints today. 7/12; patient presents for follow-up. Has been using Keystone antibiotics to the wound beds with calcium alginate. She has no issues or complaints today. She SHERETHA, BAGDASARIAN [name] ([id number]) [id number] Page 17 of 22 never followed up with her orthopedic surgeon who did the OR debridement to the right foot. We discussed the total contact cast for the left foot and patient would like to do this next week. 7/19; patient presents for follow-up. She has been using Keystone antibiotics with calcium alginate to the wound beds. She has no issues or complaints today. Patient is in agreement to do the total contact cast of the left foot today. She knows to return later this week for the obligatory cast change. 05-13-2022 upon evaluation today patient's wound which she has the cast of the left leg actually appears to be doing significantly better. Fortunately I do not see any signs of active infection locally or systemically which is great news and overall I am extremely pleased with where we stand currently. 7/26; patient presents for follow-up. She has [name] cast in place for the past week. She states it irritated her shin. Other than that she tolerated the cast well. She states she would  like [name] break for 1 week from the cast. We have been using Keystone antibiotic and Aquacel to both wound beds. She denies signs of infection. 8/2; patient presents for follow-up. She has been using Keystone and Aquacel to the wound beds. She denies any issues and has no complaints. She is agreeable to have the cast placed today for the left  leg. 06-03-2022 upon evaluation today patient appears to be doing well with regard to her wound she saw some signs of improvement which is great news. Fortunately I do not see any evidence of active infection locally or systemically at this time which is great news. No fevers, chills, nausea, vomiting, or diarrhea. 8/16; patient presents for follow-up. She has no issues or complaints today. We have been using Keystone and Aquacel to the wound beds. The left lower extremity is in [name] total contact cast. She is tolerated this well. 8/23; patient presents for follow-up. She has had the total contact cast on the left leg for the past week. Unfortunately this has rubbed and broken down the skin to the medial foot. She currently denies signs of infection. She has been using Keystone antibiotic to the right plantar foot wound. 8/30; patient presents for follow-up. We have held off on the total contact cast for the left leg for the past week. Her wound on the left foot has improved and the previous surrounding breakdown of skin has epithelialized. She has been using Keystone antibiotic to both wound beds. She has no issues or complaints today. She denies signs of infection. 9/6; patient presents for follow-up. She has ordered her's Keystone antibiotic and this is arriving this week. She has been doing Dakin's wet-to-dry dressings to the wound beds. She denies signs of infection. She is agreeable to the total contact cast today. 9/13; patient presents for follow-up. She states that the cast caused her left leg shin to be sore. She would like to take [name] break from the cast this week. She has been using Keystone antibiotic to the right plantar foot wound. She denies signs of infection. 9/20; patient presents for follow-up. She has been using Keystone antibiotics to the wound beds with calcium alginate to the right foot wound and Hydrofera Blue to the left foot wound. She is agreeable to having the cast placed  today. She has been approved for Apligraf and we will order this for next clinic visit. 9/27; patient presents for follow-up. We have been using Keystone antibiotic with Hydrofera Blue to the left foot wound under [name] total contact cast. T the right o foot wound she has been using Keystone antibiotic and calcium alginate. She declines [name] total contact cast today. Apligraf is available for placement and she would like to proceed with this. 07-28-2022 upon evaluation today patient appears to be doing well currently in regard to her wound. She is actually showing signs of significant improvement which is great news. Fortunately I do not see any evidence of active infection locally nor systemically at this time. She has been seeing Dr. Mikey Bussing and to be honest has been doing very well with the cast. Subsequently she comes in today with [name] cast on and we did reapply that today as well. She did not really want to she try to talk me out of that but I explained that if she wanted to heal this is really the right way to go. Patient voiced understanding. In regard to her right foot this is actually [name] lot better compared to the last time  I saw her which is also great news. 10/11; patient presents for follow-up. Apligraf and the total contact cast was placed to the left leg at last clinic visit. She states that her right foot wound had burning pain to it with the placement of Apligraf to this area. She has been doing [place] over this area. She denies signs of infection including increased warmth, erythema or purulent drainage. 11/1; 3-week follow-up. The patient fortunately did not have [name] total contact cast or an Apligraf and on the left foot. She has been using Keystone ABD pads and kerlix and her own running shoes She arrives in clinic today with thick callus and [name] very poor surface on the left foot on the right nonviable skin subcutaneous tissue and [name] deep probing hole. 11/15; patient missed her last clinic  appointment. She states she has not been dressing the wound beds for the past 2 weeks. She states that at she had [name] new roommate but is now going back to live with her mother. Apparently its been [name] distracting 2 weeks. Patient currently denies signs of infection. 11/22; patient presents for follow-up. She states she has been using Keystone antibiotic and Dakin's wet-to-dry dressings to the wound beds. She is agreeable for cast placement today. We had ordered Apligraf however this has not been received by our facility. 11/29; Patient had [name] total contact cast placed at last clinic visit and she tolerated this well. We were using silver alginate under the cast. Patient's been using Keystone antibiotic with Aquacel to the right plantar foot wound. She has no issues or complaints today. Apligraf is available for placement today. Patient would like to proceed with this. 12/6; patient presents for follow-up. She had Apligraf placed in standard fashion last clinic visit under the total contact cast to the left lower extremity. She has been using Keystone antibiotic and Aquacel to the right plantar foot wound. She has no issues or complaints today. 12/13; patient presents for follow-up. She has finished 5 Apligraf placements. Was told she would not qualify for more. We have been doing [name] total contact cast to the left lower extremity. She has been using Keystone antibiotic and Aquacel to the right plantar foot wound. She has no issues or complaints today. 12/20; patient presents for follow-up. We have been using Hydrofera Blue with Keystone antibiotic under [name] total contact cast of the left lower extremity. She reports using Keystone antibiotic and silver alginate to the right heel wound. She has no issues or complaints today. 12/27; patient presents with [name] healthy wound on the left midfoot. We have Apligraf to apply that to that more also using [name] total contact cast. On the right we are using Keystone and silver  alginate. She is offloading the right heel with [name] surgical shoealthough by her admission she is on her feet quite [name] bit 1/3; patient presents for follow-up. Apligraf was placed to the wound beds last clinic visit. She was placed in [name] total contact cast to the left lower extremity. She declines [name] total contact cast today. She states that her mother is in the hospital and she cannot adequately get around with the cast on. 1/10; patient presents for follow-up. She declined the total contact cast at last clinic visit. Both wounds have declined in appearance. She states that she has been on her feet and not offloading the wound beds. She currently denies signs of infection. 1/17; patient presents for follow-up. She had the total contact cast along with Apligraf placed last  week to the left lower extremity. She tolerated this well. She has been using Aquacel Ag and Keystone antibiotic to the right heel wound. She currently denies signs of infection. She has no issues or complaints today. 1/24; patient presents for follow-up. We have been using Apligraf to the left foot wound along with [name] total contact cast. She has done well with this. T the right o heel wound she has been using Aquacel Ag and Keystone antibiotic ointment. She has no issues or complaints today. She denies signs of infection. RAYAUNA, PAIS [name] ([id number]) [id number] Page 18 of 22 1/31; patient presents for follow-up. We have been using Apligraf to the left foot wound along with the total contact cast. She continues to do well with this. To the right heel we have been using Aquacel Ag and Keystone antibiotic ointment. She has no issues or complaints today. 12-01-2022 upon evaluation patient is seen today on my schedule due to the fact that she unfortunately was in the hospital yesterday. Her cast needed to come off the only reason she is out of the hospital is due to the fact that they would not take it off at the  hospital which is somewhat bewildered reading to me to be perfectly honest. I am not certain why this was but either way she was released and then was placed on my schedule today in order to get this off and reapply the total contact casting as appropriate. I do not have an Apligraf for her today it was applied last week and today's actually expired yesterday as there was some scheduling conflicts with her being in the hospital. Nonetheless we do not have that for reapplication today but the good news that she is not draining too much and the Apligraf can go for up to 2 weeks so I am going to go ahead and reapply the total contact casting but we are going to leave the Apligraf in place. 2/14; patient presents for follow-up. T the left leg she has had the total contact cast and Apligraf for the past week. She has had no issues with this. T the o o right heel she has been using antibiotic ointment and Aquacel Ag. 2/21; patient presents for follow-up. We have been using Apligraf and [name] total contact cast to the left lower extremity. She is tolerated this well. Unfortunately she is not approved for any more Apligraf per insurance. She has been using antibiotic ointment and Aquacel Ag to the right foot. She has no issues or complaints today. 2/28; patient presents for follow-up. We have been using Hydrofera Blue and antibiotic ointment under the total contact cast to the left lower extremity. He has been using Aquacel Ag with antibiotic ointment to the right plantar foot. She has no issues or complaints today. 3/6; patient presents for follow-up. She did not obtain her gentamicin ointment. She has been using Aquacel Ag to the right plantar foot wound. We have been using Hydrofera Blue with antibiotic ointment under the total contact cast to the left lower extremity. She has no issues or complaints today. 3/12; patient presents for follow-up. She has been using gentamicin ointment and Aquacel Ag to the right  plantar foot wound. We have been using Hydrofera Blue with antibiotic ointment under the total contact cast on the left lower extremity foot wound. She has no issues or complaints today. 3/20; patient presents for follow-up. She has been using gentamicin ointment and Aquacel Ag to the right plantar foot wound. We have been  using Hydrofera Blue with antibiotic ointment under the total contact cast to the left lower extremity wound. She has no issues or complaints today. 3/27; patient presents for follow-up. We have been using antibiotic ointment and Aquacel Ag to the right plantar foot wound. We have been using Hydrofera Blue with antibiotic ointment under the total contact cast to the left lower extremity. Both wounds are smaller. 01-24-2023 upon evaluation today patient actually appears to be doing better compared to last time I saw her. It has been several weeks since I last saw her but nonetheless I think the total contact casting is doing [name] good job. Fortunately I do not see any evidence of infection or worsening overall which is great news and in general I do believe that she is moving in the appropriate direction. It has been [name] very long road but nonetheless I feel like she is nearing the end at least in regard to the left foot and even the right foot looks much better to be perfectly honest. 01-31-2023 upon evaluation today patient appears to be doing well currently in regard to her wounds. She has been require some sharp debridement which I think is probably bone both sides to remove [name] lot of callus that has built up at this point. I discussed that with her today. Also think total contact cast is doing well for the left foot Emina continue that as well. 4/17; patient presents for follow-up. We have been doing Hydrofera Blue with antibiotic ointment under the total contact cast to the left lower extremity and Aquacel Ag with antibiotic ointment to the right foot wound. She has been using her  offloading heel shoe here. 4/24; patient presents for follow-up. We have been doing Hydrofera Blue with antibiotic ointment under the total contact cast to the left lower extremity and Aquacel Ag with antibiotic ointment to the right foot wound. She has been using her offloading heel shoe here. Wounds are smaller with slough accumulation. 5/1; patient presents for follow-up. We have been doing Hydrofera Blue with antibiotic ointment under the total contact cast to the left lower extremity and Aquacel Ag with antibiotic ointment to the right foot wound. Left foot wound is stable and right foot wound appears smaller. 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 On the left she is using Hydrofera Blue topical antibiotic under [name] total contact cast, on the right plantar heel gentamicin Aquacel Ag. According to our intake nurse both are making slow but steady improvements. 5/15; patient presents for follow-up. We have been doing Hydrofera Blue with antibiotic ointment under the total contact cast on the left and antibiotic ointment with Aquacel Ag on the right. Unfortunately the left foot wound has declined in size and appearance. We also do not have the total contact cast available in office today. Patient denies signs of infection. 5/22; patient presents for follow-up. We have been doing Hydrofera Blue and antibiotic ointment to the left foot and Aquacel Ag with antibiotic ointment to the right foot. We took [name] break from the cast since we did not have it placed at last week. Wounds look stable if not slightly improved. 03-27-2023 patient presents today she has had this For Most 2 Weeks Because She Was in the Hospital and They Did Not T It off. Again That Is Really Not ake Something That She Could Control but Nonetheless We Are Seeing Her at This Point Today for Reevaluation. We Do Want to Go and See about Reapply the Cast. Again We Had All  Set Everything up to Go Ahead and Do This When We Went to Get the  Cast Only to Realize That Not Had Come in and We Were out at the Moment. With That Being [place] We Will Get Have T Get the Exam before We Can Reapply the T Contact Casting Going Forward. o otal 6/12; patient presents for follow-up. She has been using Hydrofera Blue to the left foot wound and Aquacel Ag to the right foot wound. No cast was available last week placed on the patient so she has been offloading with her surgical shoe. She has no issues or complaints today. 6/19; patient presents for follow-up. We have been using Hydrofera Blue to the left foot wound under the total contact cast and Aquacel Ag to the right wound. She has no issues or complaints today. The wounds are smaller. 6/26; patient presents for follow-up. We have been using Hydrofera Blue to the left foot wound under the total contact cast and Aquacel Ag to the right foot wound. Wounds are smaller. Patient has no issues or complaints today. 7/3; patient presents for follow-up. We have been using Hydrofera Blue to the left foot wound under the total contact cast and Aquacel Ag to the right foot wound. Wounds Appear smaller. No signs of infection. 7/10; patient presents for follow-up. We have been using Hydrofera Blue to the left foot wound under the total contact cast and Aquacel Ag to the right foot wound. Left foot wound is smaller. Right foot wound is stable. 7/17; patient presents for follow-up. We have been using Hydrofera Blue to the left foot under the total contact cast and Aquacel Ag to the right foot wound. She has no issues or complaints today. 7/24; patient presents for follow-up. We have been using Hydrofera Blue to the left foot under the total contact cast and Aquacel Ag to the right foot wound. Unfortunately she was bit by her dog over the weekend and developed [name] wound to the right lower leg. She has been keeping the area covered. 7/31; patient presents for follow-up. We have been using Hydrofera Blue to the left foot  under the total contact cast and Aquacel Ag to the right foot wound and mupirocin ointment to the right anterior leg wound. She started her oral antibiotics 2 days ago. 8/7; patient presents for follow-up. We have been using Hydrofera Blue and antibiotic ointment to the left foot under the total contact cast and Aquacel Ag to the right foot wound. Antibiotic ointment to the right anterior leg wound. She completed her oral antibiotics. She has no issues or complaints today. Wounds continue to improve albeit slowly. JELITZA, OHLE [name] ([id number]) [id number] Page 19 of 22 8/14; 8/14; patient has 3 wounds 1 on the left plantar heel 1 on the right plantar heel and [name] small wound on the right anterior lower leg. We have been using Hydrofera Blue in the left heel Aquacel on the right heel. The left heel has been in [name] total contact cast 8/21; patient presents for follow-up. We have been using Hydrofera Blue to the left foot wound under the total contact cast. T the right heel patient has been o using Aquacel Ag. T the anterior leg wound antibiotic ointment. All wounds have healed. o 8/28; patient presents for follow-up. She has been using her surgical shoe with offloading felt pads. Her wounds have remained closed. 9/11; patient presents for final follow-up to assure that her wounds have remained closed. Unfortunately the left foot wound has opened again.  But she has been wearing her Surgical shoe for offloading with felt pad. 07-14-2023 upon evaluation today patient appears to be doing well currently in regard to her wound. She has been tolerating the dressing changes without complication with total contact casting. I am going to be helping take care of her at this point as she is transferring from Dr. Mikey Bussing to me since Dr. Mikey Bussing is on maternity leave. 07-24-2023 upon evaluation today patient appears to be doing decently well currently in regard to her wound. She is actually  showing signs of improvement in regard to the left foot and I feel like this is much better than last week no debridement even necessary today. We may have to perform some slight debridement come next week we will see how things go. Monitor for any signs of worsening overall and obviously if anything changes she should be letting us know. 08-01-2023 upon evaluation today patient appears to be doing excellent in regard to her feet. The right foot/heel area is completely healed she does have some callus buildup I am going to trim this down [name] little bit I do not want to build up to the point of cracking she is doing well with the urea cream. With regard to the left heel she is doing well with casting I am going to perform some debridement here to clear away before reapplying the cast and I think she is really doing quite well though. 08-07-2023 upon evaluation patient's wound actually showed signs of excellent improvement and actually very pleased with where we stand I do believe that she is making good headway towards complete closure which is great news. 08-14-2023 upon evaluation today patient appears to be doing well currently in regard to her wound. She actually is going require [name] little bit of debridement but fortunately nothing too significant today actually feel like she is doing much better. This is mainly to remove callus on the little slough and biofilm down to good subcutaneous tissue. In general I think that she is making excellent headway here. 08-21-2023 upon evaluation today patient appears to be doing well currently in regard to her wound which is actually showing signs of significant improvement. I am extremely pleased with where things stand I do not see any signs of infection at this time which is great news and overall I do think that we are making headway towards complete closure which is also excellent news. 08-30-2023 upon evaluation today patient's wound is showing signs of  continued improvement. Fortunately I do not see any evidence of worsening overall and I believe that the patient is making excellent headway towards closure. I am extremely pleased with where we stand. 09-04-2023 upon evaluation today patient appears to be doing well currently in regard to her foot ulcer. She has been tolerating the dressing changes without complication. Fortunately I do not see any signs of active infection locally or systemically at this time which is great news. No fevers, chills, nausea, vomiting, or diarrhea. Objective Constitutional Well-nourished and well-hydrated in no acute distress. Vitals Time Taken: 9:01 AM, Height: 69 in, Weight: 178 lbs, BMI: 26.3, Temperature: 98.5 F, Pulse: 80 bpm, Respiratory Rate: 16 breaths/min, Blood Pressure: 112/68 mmHg. Respiratory normal breathing without difficulty. Psychiatric this patient is able to make decisions and demonstrates good insight into disease process. Alert and Oriented x 3. pleasant and cooperative. General Notes: Upon inspection patient's wound bed actually did require some debridement in regard to the callus as well as slough and biofilm down  to good subcutaneous tissue. She tolerated the debridement today without complication and postdebridement wound bed actually appears to be doing significantly better at this time. Integumentary (Hair, Skin) Wound #12R status is Open. Original cause of wound was Pressure Injury. The date acquired was: 03/16/2020. The wound has been in treatment 76 weeks. The wound is located on the Left,Medial,Plantar Foot. The wound measures 0.1cm length x 0.2cm width x 0.1cm depth; 0.016cm^2 area and 0.002cm^3 volume. There is Fat Layer (Subcutaneous Tissue) exposed. There is [name] medium amount of serosanguineous drainage noted. The wound margin is flat and intact. There is no granulation within the wound bed. There is no necrotic tissue within the wound bed. 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 There was [name] Selective/Open Wound Non-Viable Tissue Debridement with [name] total area of 0.07 sq cm performed by [name], PA-C. With the following instrument(s): Curette to remove Viable and Non-Viable tissue/material. Material removed includes Callus. [name] time out was conducted at 09:20, prior to the start of the procedure. [name] Minimum amount of bleeding was controlled with Pressure. The procedure was tolerated well. Post Debridement Measurements: 0.3cm length x 0.3cm width x 0.2cm depth; 0.014cm^3 volume. Character of Wound/Ulcer Post Debridement is stable. Severity of Tissue Post Debridement is: Fat layer exposed. Post procedure Diagnosis Wound #12R: Same as Pre-Procedure Pre-procedure diagnosis of Wound #12R is [name] Diabetic Wound/Ulcer of the Lower Extremity located on the Left,Medial,Plantar Foot . There was [name] T Contact otal Cast Procedure by [name], PA-C. Post procedure Diagnosis Wound #12R: Same as Pre-Procedure Plan Follow-up Appointments: Return Appointment in 1 week. Bathing/ Shower/ Hygiene: May shower; gently cleanse wound with antibacterial soap, rinse and pat dry prior to dressing wounds No tub bath. Off-Loading: T Contact Cast to Left Lower Extremity otal Open toe surgical shoe - right foot WOUND #12R: - Foot Wound Laterality: Plantar, Left, Medial Cleanser: Vashe 5.8 (oz) 1 x Per  Week/30 Days Discharge Instructions: Use vashe 5.8 (oz) as directed Prim Dressing: Promogran Matrix 4.34 (in) 1 x Per Week/30 Days ary Discharge Instructions: Moisten w/normal saline or sterile water and apply into wound bed Prim Dressing: Silvercel Small 2x2 (in/in) 1 x Per Week/30 Days ary Discharge Instructions: Apply Silvercel Small 2x2 (in/in) over wound area Secondary Dressing: Gauze 1 x Per Week/30 Days Discharge Instructions: As directed: dry, moistened with saline or moistened with Dakins Solution Secured With: [employer] Sterile or Non-Sterile 6-ply 4.5x4 (yd/yd) 1 x Per Week/30 Days Discharge Instructions: Apply Kerlix as directed 1. I would recommend that we have the patient continue to monitor for any signs of infection there was an area where there is some fluid collection underneath the callus although I did not see anything that actually appeared to be open. We can to put some silver alginate over this area. 2. I am going to recommend as well the patient should continue with total contact casting which I think has really been doing quite well for her. We will see patient back for reevaluation in 1 week here in the clinic.  If anything worsens or changes patient will contact our office for additional recommendations. Electronic Signature(s) Signed: 09/04/2023 1:54:06 PM By: [name] PA-C Entered By: [name] on 09/04/2023 13:54:06 -------------------------------------------------------------------------------- Total Contact Cast Details Patient Name: Date of Service: [name], MCGAHA 09/04/2023 9:00 [name] ANACELI, WHERRY [name] ([id number]) [id number] Page 21 of 22 Medical Record Number: [id number] Patient Account Number: [id number] Date of Birth/Sex: Treating RN: 15-Aug-1976 (47 y.o. [name] Primary Care Provider: [name] Other Clinician: [name] Referring Provider: Treating Provider/Extender: [name] in Treatment: 76 T Contact Cast Applied for Wound Assessment: otal Wound #12R Left,Medial,Plantar Foot Performed By: Physician [name], PA-C The following information was scribed by: [name] The information was scribed for: [name] Post Procedure Diagnosis Same as Pre-procedure Electronic Signature(s) Signed: 09/04/2023 4:31:01 PM By: [name] PA-C Signed: 09/04/2023 4:35:08 PM By: [name] Entered By: [name] on 09/04/2023 09:20:22 -------------------------------------------------------------------------------- SuperBill Details Patient Name: Date of Service: [name] 09/04/2023 Medical Record Number: [id number] Patient Account Number: [id number] Date of Birth/Sex: Treating RN: 11/13/1975 (47 y.o. Sharyne Peach, Carrie Primary Care Provider: [name] Other Clinician: [name] Referring Provider: Treating Provider/Extender: [name] in Treatment: 76 Diagnosis Coding ICD-10 Codes Code Description E11.621 Type 2 diabetes mellitus with foot ulcer L97.528 Non-pressure chronic ulcer of other part of left foot with other specified severity E11.42 Type 2 diabetes mellitus with diabetic polyneuropathy Z79.4 Long term (current) use of insulin N18.6 End stage renal disease Z99.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 L84 Corns and callosities Facility Procedures : CPT4 Code: 08657846 Description: 97597 - DEBRIDE WOUND 1ST 20 SQ CM OR < ICD-10 Diagnosis Description L97.528 Non-pressure chronic ulcer of other part of left foot with other specified severi Modifier: ty Quantity: 1 Physician Procedures : CPT4 Code Description Modifier [id number] 97597 - WC PHYS DEBR WO ANESTH 20 SQ CM ICD-10 Diagnosis Description L97.528 Non-pressure chronic ulcer of other part of left foot with other specified severity Quantity: 1 Electronic Signature(s) Signed: 09/04/2023 1:54:30 PM By: Cathren Harsh, Arnitra [name] ([id number])  [id number] Page 22 of 22 Entered By: [name] on 09/04/2023 13:54:29

## 2023-09-06 NOTE — Progress Notes (Signed)
Heather, PIECHOCKI Heather ([id number]) [id number] Page 1 of 8 Visit Report for 09/04/2023 Arrival Information Details Patient Name: Date of Service: Heather Heather, Heather Heather 09/04/2023 9:00 Heather Heather Medical Record Number: [id number] Patient Account Number: [id number] Date of Birth/Sex: Treating RN: [birth year] (47 y.o. [name] Primary Care Brienna Bass: [name] Other Clinician: [name] Referring Axel Meas: Treating Laker Thompson/Extender: [name] in Treatment: 53 Visit Information History Since Last Visit All ordered tests and consults were completed: No Patient Arrived: Wheel Chair Added or deleted any medications: No Arrival Time: 09:00 Any new allergies or adverse reactions: No Transfer Assistance: EasyPivot Patient Lift Had Heather fall or experienced change in No Patient Identification Verified: Yes activities of daily living that may affect Secondary Verification Process Completed: Yes risk of falls: Patient Requires Transmission-Based Precautions: No Signs or symptoms of abuse/neglect since last visito No Patient Has Alerts: No Hospitalized since last visit: No Implantable device outside of the clinic excluding No cellular tissue based products placed in the center since last visit: Has Dressing in Place as Prescribed: Yes Has Footwear/Offloading in Place as Prescribed: Yes Left: T Contact Cast otal Pain Present Now: No Electronic Signature(s) Signed: 09/04/2023 4:35:08 PM By: [name] Entered By: [name] on 09/04/2023 09:01:34 -------------------------------------------------------------------------------- Clinic Level of Care Assessment Details Patient Name: Date of Service: Heather Heather, Heather Heather 09/04/2023 9:00 Heather Heather Medical Record Number: [id number] Patient Account Number: [id number] Date of Birth/Sex: Treating RN: 07/18/1976 (47 y.o. [name] Primary Care Astor Gentle: [name] Other Clinician: [name] Referring Suhey Radford: Treating Timur Nibert/Extender: [name] in Treatment: 76 Clinic Level of Care Assessment Items TOOL 1 Quantity Score []  - 0 Use when EandM and Procedure is performed on INITIAL visit ASSESSMENTS - Nursing Assessment / Reassessment []  - 0 General Physical Exam (combine w/ comprehensive assessment (listed just below) when performed on new pt. evalsTYISHA, KOSCO Heather ([id number]) [id number] Page 2 of 8 []  - 0 Comprehensive Assessment (HX, ROS, Risk Assessments, Wounds Hx, etc.) ASSESSMENTS - Wound and Skin Assessment / Reassessment []  - 0 Dermatologic / Skin Assessment (not related to wound area) ASSESSMENTS - Ostomy and/or Continence Assessment and Care []  - 0 Incontinence Assessment and Management []  - 0 Ostomy Care Assessment and Management (repouching, etc.) PROCESS - Coordination of Care []  - 0 Simple Patient / Family Education for ongoing care []  - 0 Complex (extensive) Patient / Family Education for ongoing care []  - 0 Staff obtains Chiropractor, Records, T Results / Process Orders est []  - 0 Staff telephones HHA, Nursing Homes / Clarify orders / etc []  - 0 Routine Transfer to another Facility (non-emergent condition) []  - 0 Routine Hospital Admission (non-emergent condition) []  - 0 New Admissions / Manufacturing engineer / Ordering NPWT Apligraf, etc. , []  - 0 Emergency Hospital Admission (emergent condition) PROCESS - Special Needs []  - 0 Pediatric / Minor Patient Management []  - 0 Isolation Patient Management []  - 0 Hearing / Language / Visual special needs []  - 0 Assessment of Community assistance (transportation, D/C planning, etc.) []  - 0 Additional assistance / Altered mentation []  - 0 Support Surface(s) Assessment (bed, cushion, seat, etc.) INTERVENTIONS - Miscellaneous []  - 0 External ear exam []  - 0 Patient Transfer (multiple staff / Nurse, adult / Similar devices) []  -  0 Simple Staple / Suture removal (25 or less) []  - 0 Complex Staple / Suture removal (26 or more) []  - 0 Hypo/Hyperglycemic Management (do not check if billed separately) []  -  0 Ankle / Brachial Index (ABI) - do not check if billed separately Has the patient been seen at the hospital within the last three years: Yes Total Score: 0 Level Of Care: ____ Electronic Signature(s) Signed: 09/04/2023 4:35:08 PM By: [name] Entered By: [name] on 09/04/2023 09:31:35 -------------------------------------------------------------------------------- Encounter Discharge Information Details Patient Name: Date of Service: Heather Heather, Heather Heather. 09/04/2023 9:00 Heather Heather Medical Record Number: [id number] Patient Account Number: [id number] Date of Birth/Sex: Treating RN: 12-Oct-1976 (47 y.o. Heather, Stache, Chee Heather ([id number]) [id number] Page 3 of 8 Primary Care Dael Howland: [name] Other Clinician: [name] Referring Beau Vanduzer: Treating Cristianna Cyr/Extender: [name] in Treatment: 25 Encounter Discharge Information Items Post Procedure Vitals Discharge Condition: Stable Temperature (F): 98.5 Ambulatory Status: Wheelchair Pulse (bpm): 80 Discharge Destination: Home Respiratory Rate (breaths/min): 16 Transportation: Private Auto Blood Pressure (mmHg): 112/68 Accompanied By: mother Schedule Follow-up Appointment: Yes Clinical Summary of Care: Electronic Signature(s) Signed: 09/04/2023 4:35:08 PM By: [name] Entered By: [name] on 09/04/2023 10:17:16 -------------------------------------------------------------------------------- Lower Extremity Assessment Details Patient Name: Date of Service: Heather Heather, Heather Heather 09/04/2023 9:00 Heather Heather Medical Record Number: [id number] Patient Account Number: [id number] Date of Birth/Sex: Treating RN: 1976/09/03 (47 y.o. [name] Primary Care Tommy Goostree: [name] Other Clinician: [name] Referring Nicolemarie Wooley: Treating Gabriel Conry/Extender: [name] Weeks in Treatment: 50 Electronic Signature(s) Signed: 09/04/2023 4:35:08 PM By: [name] Signed: 09/06/2023 4:42:48 PM By: Yevonne Pax RN Entered By: [name] on 09/04/2023 09:16:23 -------------------------------------------------------------------------------- Multi Wound Chart Details Patient Name: Date of Service: Heather Heather, Heather Heather. 09/04/2023 9:00 Heather Heather Medical Record Number: [id number] Patient Account Number: [id number] Date of Birth/Sex: Treating RN: [birth year] (47 y.o. [name] Primary Care Lasalle Abee: [name] Other Clinician: [name] Referring Lylah Lantis: Treating Barnie Sopko/Extender: [name] in Treatment: 73 Vital Signs Height(in): 69 Pulse(bpm): 80 Weight(lbs): 178 Blood Pressure(mmHg): 112/68 Body Mass Index(BMI): 26.3 Temperature(F): 98.5 Respiratory Rate(breaths/min): 16 Dunlap, Fumie Heather ([id number]) [id number] Page 4 of 8 [12R:Photos:] [N/Heather:N/Heather] Left, Medial, Plantar Foot N/Heather N/Heather Wound Location: Pressure Injury N/Heather N/Heather Wounding Event: Diabetic Wound/Ulcer of the Lower N/Heather N/Heather Primary Etiology: Extremity Chronic sinus problems/congestion, N/Heather N/Heather Comorbid History: Middle ear problems, Anemia, Chronic Obstructive Pulmonary Disease (COPD), Congestive Heart Failure, Type II Diabetes, End Stage Renal Disease, History of pressure wounds, Neuropathy 03/16/2020 N/Heather N/Heather Date Acquired: 27 N/Heather N/Heather Weeks of Treatment: Open N/Heather N/Heather Wound Status: Yes N/Heather N/Heather Wound Recurrence: 0.1x0.2x0.1 N/Heather N/Heather Measurements L x W x D (cm) 0.016 N/Heather N/Heather Heather (cm) : rea 0.002 N/Heather N/Heather Volume (cm) : 99.70% N/Heather N/Heather % Reduction in Heather rea: 99.80% N/Heather N/Heather % Reduction in Volume: Grade 3 N/Heather N/Heather Classification: Medium N/Heather N/Heather Exudate Heather mount: Serosanguineous N/Heather N/Heather Exudate Type: red,  brown N/Heather N/Heather Exudate Color: Flat and Intact N/Heather N/Heather Wound Margin: None Present (0%) N/Heather N/Heather Granulation Heather mount: None Present (0%) N/Heather N/Heather Necrotic Heather mount: Fat Layer (Subcutaneous Tissue): Yes N/Heather N/Heather Exposed Structures: Fascia: No Tendon: No Muscle: No Joint: No Bone: No Large (67-100%) N/Heather N/Heather Epithelialization: Treatment Notes Electronic Signature(s) Signed: 09/04/2023 4:35:08 PM By: [name] Entered By: [name] on 09/04/2023 09:16:45 -------------------------------------------------------------------------------- Multi-Disciplinary Care Plan Details Patient Name: Date of Service: Heather Jefferson Heather. 09/04/2023 9:00 Heather Heather Medical Record Number: [id number] Patient Account Number: [id number] Date of Birth/Sex: Treating RN: 06/25/1976 (47 y.o. [name] Primary Care Nikia Mangino: [name] Other Clinician: [name] Referring Malea Swilling: Treating  Alexcis Bicking/Extender: [name] Weeks in Treatment: [address] [place], [place] Heather ([id number]) [id number] Page 5 of 8 Abuse / Safety / Falls / Self Care Management Nursing Diagnoses: History of Falls Potential for falls Potential for injury related to falls Goals: Patient will not develop complications from immobility Date Initiated: 03/17/2022 Date Inactivated: 04/20/2022 Target Resolution Date: 03/16/2022 Goal Status: Met Patient/caregiver will verbalize understanding of skin care regimen Date Initiated: 03/17/2022 Date Inactivated: 09/28/2022 Target Resolution Date: 03/16/2022 Goal Status: Met Patient/caregiver will verbalize/demonstrate measure taken to improve self care Date Initiated: 03/17/2022 Target Resolution Date: 06/21/2023 Goal Status: Active Interventions: Assess fall risk on admission and as needed Provide education on basic hygiene Provide education on personal and home safety Notes: Electronic Signature(s) Signed: 09/04/2023 4:35:08 PM By:  [name] Signed: 09/06/2023 4:42:48 PM By: Yevonne Pax RN Entered By: [name] on 09/04/2023 09:31:49 -------------------------------------------------------------------------------- Pain Assessment Details Patient Name: Date of Service: Heather Jefferson Heather. 09/04/2023 9:00 Heather Heather Medical Record Number: [id number] Patient Account Number: [id number] Date of Birth/Sex: Treating RN: 05/30/1976 (47 y.o. [name] Primary Care Kerrion Kemppainen: [name] Other Clinician: [name] Referring Cheryle Dark: Treating Ociel Retherford/Extender: [name] in Treatment: 19 Active Problems Location of Pain Severity and Description of Pain Patient Has Paino No Site Locations [place], [place] Heather ([id number]) [id number] Page 6 of 8 Pain Management and Medication Current Pain Management: Electronic Signature(s) Signed: 09/04/2023 4:35:08 PM By: [name] Signed: 09/06/2023 4:42:48 PM By: Yevonne Pax RN Entered By: [name] on 09/04/2023 09:04:50 -------------------------------------------------------------------------------- Patient/Caregiver Education Details Patient Name: Date of Service: Heather Heather 11/11/2024andnbsp9:00 Heather Heather Medical Record Number: [id number] Patient Account Number: [id number] Date of Birth/Gender: Treating RN: [birth year] (47 y.o. [name] Primary Care Physician: [name] Other Clinician: [name] Referring Physician: Treating Physician/Extender: [name] in Treatment: 54 Education Assessment Education Provided To: Patient Education Topics Provided Wound/Skin Impairment: Handouts: Other: continue wound care as directed Methods: Explain/Verbal Responses: State content correctly Electronic Signature(s) Signed: 09/04/2023 4:35:08 PM By: [name] Entered By: [name] on 09/04/2023 10:14:24 Earmon Phoenix Heather ([id number])  [id number] Page 7 of 8 -------------------------------------------------------------------------------- Wound Assessment Details Patient Name: Date of Service: Heather Heather, Heather Heather 09/04/2023 9:00 Heather Heather Medical Record Number: [id number] Patient Account Number: [id number] Date of Birth/Sex: Treating RN: 1975/11/18 (47 y.o. [name] Primary Care Daymon Hora: [name] Other Clinician: [name] Referring Frady Taddeo: Treating Tineshia Becraft/Extender: [name] Weeks in Treatment: 76 Wound Status Wound Number: 12R Primary Diabetic Wound/Ulcer of the Lower Extremity Etiology: Wound Location: Left, Medial, Plantar Foot Wound Open Wounding Event: Pressure Injury Status: Date Acquired: 03/16/2020 Comorbid Chronic sinus problems/congestion, Middle ear problems, Anemia, Weeks Of Treatment: 76 History: Chronic Obstructive Pulmonary Disease (COPD), Congestive Heart Clustered Wound: No Failure, Type II Diabetes, End Stage Renal Disease, History of pressure wounds, Neuropathy Photos Wound Measurements Length: (cm) 0.1 Width: (cm) 0.2 Depth: (cm) 0.1 Area: (cm) 0.016 Volume: (cm) 0.002 % Reduction in Area: 99.7% % Reduction in Volume: 99.8% Epithelialization: Large (67-100%) Wound Description Classification: Grade 3 Wound Margin: Flat and Intact Exudate Amount: Medium Exudate Type: Serosanguineous Exudate Color: red, brown Foul Odor After Cleansing: No Slough/Fibrino Yes Wound Bed Granulation Amount: None Present (0%) Exposed Structure Necrotic Amount: None Present (0%) Fascia Exposed: No Fat Layer (Subcutaneous Tissue) Exposed: Yes Tendon Exposed: No Muscle Exposed: No Joint Exposed: No Bone Exposed: No Treatment Notes Wound #12R (Foot) Wound Laterality: Plantar, Left, Medial Cleanser Vashe 5.8 (oz) Discharge  Instruction: Use vashe 5.8 (oz) as directed Heather Heather, Heather Heather ([id number]) [id number].pdf Page 8 of  8 Heather-Wound Care Topical Primary Dressing Promogran Matrix 4.34 (in) Discharge Instruction: Moisten w/normal saline or sterile water and apply into wound bed Silvercel Small 2x2 (in/in) Discharge Instruction: Apply Silvercel Small 2x2 (in/in) over wound area Secondary Dressing Gauze Discharge Instruction: As directed: dry, moistened with saline or moistened with Dakins Solution Secured With [employer] Sterile or Non-Sterile 6-ply 4.5x4 (yd/yd) Discharge Instruction: Apply Kerlix as directed Compression Wrap Compression Stockings Add-Ons Electronic Signature(s) Signed: 09/04/2023 4:35:08 PM By: [name] Signed: 09/06/2023 4:42:48 PM By: Yevonne Pax RN Entered By: [name] on 09/04/2023 09:16:09 -------------------------------------------------------------------------------- Vitals Details Patient Name: Date of Service: Heather Heather, Heather Heather. 09/04/2023 9:00 Heather Heather Medical Record Number: [id number] Patient Account Number: [id number] Date of Birth/Sex: Treating RN: 05-27-1976 (47 y.o. [name] Primary Care Lazarius Rivkin: [name] Other Clinician: [name] Referring Faylynn Stamos: Treating Ronney [employer]/Extender: [name] in Treatment: 24 Vital Signs Time Taken: 09:01 Temperature (F): 98.5 Height (in): 69 Pulse (bpm): 80 Weight (lbs): 178 Respiratory Rate (breaths/min): 16 Body Mass Index (BMI): 26.3 Blood Pressure (mmHg): 112/68 Reference Range: 80 - 120 mg / dl Electronic Signature(s) Signed: 09/04/2023 4:35:08 PM By: [name] Entered By: [name] on 09/04/2023 09:04:42

## 2023-09-11 ENCOUNTER — Encounter: Payer: MEDICAID | Admitting: Physician Assistant

## 2023-09-11 DIAGNOSIS — E11621 Type 2 diabetes mellitus with foot ulcer: Secondary | ICD-10-CM | POA: Diagnosis not present

## 2023-09-11 NOTE — Progress Notes (Addendum)
JACQUILYN, BENOIT [name] ([id number]) [id number] Page 1 of 8 Visit Report for 09/11/2023 Arrival Information Details Patient Name: Date of Service: [name], [name] 09/11/2023 9:00 [name]: [id number] Patient Account Number: [id number] Date of Birth/Sex: Treating RN: 08/27/1976 (47 y.o. [name] Primary Care Vonnie Ligman: [name] Other Clinician: [name] Referring Sina Lucchesi: Treating Rollen Selders/Extender: [name] in Treatment: 60 Visit Information History Since Last Visit All ordered tests and consults were completed: No Patient Arrived: Wheel Chair Added or deleted any medications: No Arrival Time: 09:08 Any new allergies or adverse reactions: No Transfer Assistance: None Had [name] fall or experienced change in No Patient Identification Verified: Yes activities of daily living that may affect Secondary Verification Process Completed: Yes risk of falls: Patient Requires Transmission-Based Precautions: No Signs or symptoms of abuse/neglect since last visito No Patient Has Alerts: No Hospitalized since last visit: No Implantable device outside of the clinic excluding No cellular tissue based products placed in the center since last visit: Has Dressing in Place as Prescribed: Yes Has Footwear/Offloading in Place as Prescribed: Yes Left: T Contact Cast otal Pain Present Now: No Electronic Signature(s) Signed: 09/12/2023 4:17:00 PM By: [name] Entered By: [name] on 09/11/2023 06:09:19 -------------------------------------------------------------------------------- Clinic Level of Care Assessment Details Patient Name: Date of Service: [name], [name] 09/11/2023 9:00 [name]: [id number] Patient Account Number: [id number] Date of Birth/Sex: Treating RN: 12/27/1975 (47 y.o. [name] Primary Care Damarko Stitely: [name] Other Clinician: [name] Referring  Chlora Mcbain: Treating Karima Carrell/Extender: [name] in Treatment: 77 Clinic Level of Care Assessment Items TOOL 1 Quantity Score []  - 0 Use when EandM and Procedure is performed on INITIAL visit ASSESSMENTS - Nursing Assessment / Reassessment []  - 0 General Physical Exam (combine w/ comprehensive assessment (listed just below) when performed on new pt. evalsMAHALAH, MONTELLANO [name] ([id number]) [id number] Page 2 of 8 []  - 0 Comprehensive Assessment (HX, ROS, Risk Assessments, Wounds Hx, etc.) ASSESSMENTS - Wound and Skin Assessment / Reassessment []  - 0 Dermatologic / Skin Assessment (not related to wound area) ASSESSMENTS - Ostomy and/or Continence Assessment and Care []  - 0 Incontinence Assessment and Management []  - 0 Ostomy Care Assessment and Management (repouching, etc.) PROCESS - Coordination of Care []  - 0 Simple Patient / Family Education for ongoing care []  - 0 Complex (extensive) Patient / Family Education for ongoing care []  - 0 Staff obtains Chiropractor, Records, T Results / Process Orders est []  - 0 Staff telephones HHA, Nursing Homes / Clarify orders / etc []  - 0 Routine Transfer to another Facility (non-emergent condition) []  - 0 Routine Hospital Admission (non-emergent condition) []  - 0 New Admissions / Manufacturing engineer / Ordering NPWT Apligraf, etc. , []  - 0 Emergency Hospital Admission (emergent condition) PROCESS - Special Needs []  - 0 Pediatric / Minor Patient Management []  - 0 Isolation Patient Management []  - 0 Hearing / Language / Visual special needs []  - 0 Assessment of Community assistance (transportation, D/C planning, etc.) []  - 0 Additional assistance / Altered mentation []  - 0 Support Surface(s) Assessment (bed, cushion, seat, etc.) INTERVENTIONS - Miscellaneous []  - 0 External ear exam []  - 0 Patient Transfer (multiple staff / Nurse, adult / Similar devices) []  - 0 Simple Staple /  Suture removal (25 or less) []  - 0 Complex Staple / Suture removal (26 or more) []  - 0 Hypo/Hyperglycemic Management (do not check if billed separately) []  - 0 Ankle /  Brachial Index (ABI) - do not check if billed separately Has the patient been seen at the hospital within the last three years: Yes Total Score: 0 Level Of Care: ____ Electronic Signature(s) Signed: 09/12/2023 4:17:00 PM By: [name] Entered By: [name] on 09/11/2023 06:56:53 -------------------------------------------------------------------------------- Encounter Discharge Information Details Patient Name: Date of Service: [name], Heather Bandy [name]. 09/11/2023 9:00 [name]: [id number] Patient Account Number: [id number] Date of Birth/Sex: Treating RN: [birth year] (47 y.o. Francisca, Hierro, Trinika [name] ([id number]) [id number] Page 3 of 8 Primary Care Alwyn Cordner: [name] Other Clinician: [name] Referring Maecyn Panning: Treating Nakyah Erdmann/Extender: [name] in Treatment: 55 Encounter Discharge Information Items Discharge Condition: Stable Ambulatory Status: Wheelchair Discharge Destination: Home Transportation: Private Auto Accompanied By: mother Schedule Follow-up Appointment: Yes Clinical Summary of Care: Electronic Signature(s) Signed: 09/12/2023 4:17:00 PM By: [name] Entered By: [name] on 09/11/2023 07:24:22 -------------------------------------------------------------------------------- Lower Extremity Assessment Details Patient Name: Date of Service: Heather Jefferson [name]. 09/11/2023 9:00 [name]: [id number] Patient Account Number: [id number] Date of Birth/Sex: Treating RN: 03-21-1976 (47 y.o. [name] Primary Care Naava Janeway: [name] Other Clinician: [name] Referring Rahima Fleishman: Treating Windle Huebert/Extender: [name] Weeks in Treatment: 51 Electronic  Signature(s) Signed: 09/11/2023 3:51:45 PM By: Yevonne Pax RN Signed: 09/12/2023 4:17:00 PM By: [name] Entered By: [name] on 09/11/2023 06:23:20 -------------------------------------------------------------------------------- Multi Wound Chart Details Patient Name: Date of Service: [name], Heather Bandy [name]. 09/11/2023 9:00 [name]: [id number] Patient Account Number: [id number] Date of Birth/Sex: Treating RN: April 12, 1976 (47 y.o. [name] Primary Care Valree Feild: [name] Other Clinician: [name] Referring Jinny Sweetland: Treating Nneka Blanda/Extender: [name] in Treatment: 44 Vital Signs Height(in): 69 Pulse(bpm): 71 Weight(lbs): 178 Blood Pressure(mmHg): 125/79 Body Mass Index(BMI): 26.3 Temperature(F): 98.3 Respiratory Rate(breaths/min): 18 Ballinger, Lita [name] ([id number]) [id number] Page 4 of 8 [12R:Photos:] [N/[name]:N/[name]] Left, Medial, Plantar Foot N/[name] N/[name] Wound Location: Pressure Injury N/[name] N/[name] Wounding Event: Diabetic Wound/Ulcer of the Lower N/[name] N/[name] Primary Etiology: Extremity Chronic sinus problems/congestion, N/[name] N/[name] Comorbid History: Middle ear problems, Anemia, Chronic Obstructive Pulmonary Disease (COPD), Congestive Heart Failure, Type II Diabetes, End Stage Renal Disease, History of pressure wounds, Neuropathy 03/16/2020 N/[name] N/[name] Date Acquired: 35 N/[name] N/[name] Weeks of Treatment: Open N/[name] N/[name] Wound Status: Yes N/[name] N/[name] Wound Recurrence: 0.3x0.2x0.1 N/[name] N/[name] Measurements L x W x D (cm) 0.047 N/[name] N/[name] [name] (cm) : rea 0.005 N/[name] N/[name] Volume (cm) : 99.00% N/[name] N/[name] % Reduction in [name] rea: 99.50% N/[name] N/[name] % Reduction in Volume: Grade 3 N/[name] N/[name] Classification: Medium N/[name] N/[name] Exudate [name] mount: Serosanguineous N/[name] N/[name] Exudate Type: red, brown N/[name] N/[name] Exudate Color: Flat and Intact N/[name] N/[name] Wound Margin: None Present (0%) N/[name] N/[name] Granulation [name] mount: None Present (0%) N/[name] N/[name] Necrotic  [name] mount: Fat Layer (Subcutaneous Tissue): Yes N/[name] N/[name] Exposed Structures: Fascia: No Tendon: No Muscle: No Joint: No Bone: No Large (67-100%) N/[name] N/[name] Epithelialization: Treatment Notes Electronic Signature(s) Signed: 09/12/2023 4:17:00 PM By: [name] Entered By: [name] on 09/11/2023 06:53:40 -------------------------------------------------------------------------------- Multi-Disciplinary Care Plan Details Patient Name: Date of Service: Heather Jefferson [name]. 09/11/2023 9:00 [name]: [id number] Patient Account Number: [id number] Date of Birth/Sex: Treating RN: Feb 28, 1976 (47 y.o. [name] Primary Care Georganna Maxson: [name] Other Clinician: [name] Referring Jacquise Rarick: Treating Blakeley Scheier/Extender: [name] Weeks in Treatment: 232 [name] [name], [name] ([id number]) [id number] Page 5 of 8  Abuse / Safety / Falls / Self Care Management Nursing Diagnoses: History of Falls Potential for falls Potential for injury related to falls Goals: Patient will not develop complications from immobility Date Initiated: 03/17/2022 Date Inactivated: 04/20/2022 Target Resolution Date: 03/16/2022 Goal Status: Met Patient/caregiver will verbalize understanding of skin care regimen Date Initiated: 03/17/2022 Date Inactivated: 09/28/2022 Target Resolution Date: 03/16/2022 Goal Status: Met Patient/caregiver will verbalize/demonstrate measure taken to improve self care Date Initiated: 03/17/2022 Target Resolution Date: 06/21/2023 Goal Status: Active Interventions: Assess fall risk on admission and as needed Provide education on basic hygiene Provide education on personal and home safety Notes: Electronic Signature(s) Signed: 09/11/2023 3:51:45 PM By: Yevonne Pax RN Signed: 09/12/2023 4:17:00 PM By: [name] Entered By: [name] on 09/11/2023  06:57:21 -------------------------------------------------------------------------------- Pain Assessment Details Patient Name: Date of Service: Heather Jefferson [name]. 09/11/2023 9:00 [name]: [id number] Patient Account Number: [id number] Date of Birth/Sex: Treating RN: 1976/10/06 (47 y.o. [name] Primary Care Lakisha Peyser: [name] Other Clinician: [name] Referring Belynda Pagaduan: Treating Zierra Laroque/Extender: [name] in Treatment: 18 Active Problems Location of Pain Severity and Description of Pain Patient Has Paino No Site Locations [place], [place] [name] ([id number]) [id number] Page 6 of 8 Pain Management and Medication Current Pain Management: Electronic Signature(s) Signed: 09/11/2023 3:51:45 PM By: Yevonne Pax RN Signed: 09/12/2023 4:17:00 PM By: [name] Entered By: [name] on 09/11/2023 06:11:19 -------------------------------------------------------------------------------- Patient/Caregiver Education Details Patient Name: Date of Service: [name] 11/18/2024andnbsp9:00 [name]: [id number] Patient Account Number: [id number] Date of Birth/Gender: Treating RN: 11-11-1975 (47 y.o. [name] Primary Care Physician: [name] Other Clinician: [name] Referring Physician: Treating Physician/Extender: [name] in Treatment: 26 Education Assessment Education Provided To: Patient Education Topics Provided Wound/Skin Impairment: Handouts: Other: continue wound care as directed Methods: Explain/Verbal Responses: State content correctly Electronic Signature(s) Signed: 09/12/2023 4:17:00 PM By: [name] Entered By: [name] on 09/11/2023 07:23:33 [name] ([id number]) [id number] Page 7 of 8 -------------------------------------------------------------------------------- Wound  Assessment Details Patient Name: Date of Service: [name], [name] 09/11/2023 9:00 [name]: [id number] Patient Account Number: [id number] Date of Birth/Sex: Treating RN: Dec 08, 1975 (47 y.o. [name] Primary Care Dniyah Grant: [name] Other Clinician: [name] Referring Graylee Arutyunyan: Treating Bayne Fosnaugh/Extender: [name] Weeks in Treatment: 77 Wound Status Wound Number: 12R Primary Diabetic Wound/Ulcer of the Lower Extremity Etiology: Wound Location: Left, Medial, Plantar Foot Wound Open Wounding Event: Pressure Injury Status: Date Acquired: 03/16/2020 Comorbid Chronic sinus problems/congestion, Middle ear problems, Anemia, Weeks Of Treatment: 77 History: Chronic Obstructive Pulmonary Disease (COPD), Congestive Heart Clustered Wound: No Failure, Type II Diabetes, End Stage Renal Disease, History of pressure wounds, Neuropathy Photos Wound Measurements Length: (cm) 0.3 Width: (cm) 0.2 Depth: (cm) 0.1 Area: (cm) 0.047 Volume: (cm) 0.005 % Reduction in Area: 99% % Reduction in Volume: 99.5% Epithelialization: Large (67-100%) Wound Description Classification: Grade 3 Wound Margin: Flat and Intact Exudate Amount: Medium Exudate Type: Serosanguineous Exudate Color: red, brown Foul Odor After Cleansing: No Slough/Fibrino Yes Wound Bed Granulation Amount: None Present (0%) Exposed Structure Necrotic Amount: None Present (0%) Fascia Exposed: No Fat Layer (Subcutaneous Tissue) Exposed: Yes Tendon Exposed: No Muscle Exposed: No Joint Exposed: No Bone Exposed: No Treatment Notes Wound #12R (Foot) Wound Laterality: Plantar, Left, Medial Cleanser Vashe 5.8 (oz) Discharge Instruction: Use vashe 5.8 (oz) as directed JAMIESON, HODGMAN [name] ([id number]) [id number] Page 8 of 8 Heather-Wound Care Topical Primary  Dressing Promogran Matrix 4.34 (in) Discharge Instruction: Moisten w/normal saline or sterile water and  apply into wound bed Silvercel Small 2x2 (in/in) Discharge Instruction: Apply Silvercel Small 2x2 (in/in) over wound area Secondary Dressing Gauze Discharge Instruction: As directed: dry, moistened with saline or moistened with Dakins Solution Secured With [employer] Sterile or Non-Sterile 6-ply 4.5x4 (yd/yd) Discharge Instruction: Apply Kerlix as directed Compression Wrap Compression Stockings Add-Ons Electronic Signature(s) Signed: 09/11/2023 3:51:45 PM By: Yevonne Pax RN Signed: 09/12/2023 4:17:00 PM By: [name] Entered By: [name] on 09/11/2023 06:22:57 -------------------------------------------------------------------------------- Vitals Details Patient Name: Date of Service: [name], Mayeli [name]. 09/11/2023 9:00 [name]: [id number] Patient Account Number: [id number] Date of Birth/Sex: Treating RN: [birth year] (47 y.o. [name] Primary Care Shenee Wignall: [name] Other Clinician: [name] Referring Karaline Buresh: Treating Lesa Vandall/Extender: [name] in Treatment: 77 Vital Signs Time Taken: 09:09 Temperature (F): 98.3 Height (in): 69 Pulse (bpm): 71 Weight (lbs): 178 Respiratory Rate (breaths/min): 18 Body Mass Index (BMI): 26.3 Blood Pressure (mmHg): 125/79 Reference Range: 80 - 120 mg / dl Electronic Signature(s) Signed: 09/12/2023 4:17:00 PM By: [name] Entered By: [name] on 09/11/2023 06:11:15

## 2023-09-11 NOTE — Progress Notes (Signed)
[name], [name] ([id number]) [id number] Page 1 of 21 Visit Report for 09/11/2023 Chief Complaint Document Details Patient Name: Date of Service: [name], [name] 09/11/2023 9:00 [name]: [id number] Patient Account Number: [id number] Date of Birth/Sex: Treating RN: Jun 30, 1976 (47 y.o. [name] Primary Care Provider: [name] Other Clinician: [name] Referring Provider: Treating Provider/Extender: [name] Weeks in Treatment: 80 Information Obtained from: Patient Chief Complaint Left foot ulcer Electronic Signature(s) Signed: 09/11/2023 8:59:51 AM By: [name] PA-C Entered By: [name] on 09/11/2023 05:59:51 -------------------------------------------------------------------------------- HPI Details Patient Name: Date of Service: [name] 09/11/2023 9:00 [name]: [id number] Patient Account Number: [id number] Date of Birth/Sex: Treating RN: [birth year] (47 y.o. [name] Primary Care Provider: [name] Other Clinician: [name] Referring Provider: Treating Provider/Extender: [name] in Treatment: 2 History of Present Illness HPI Description: 01/18/18-She is here for initial evaluation of the left great toe ulcer. She is [name] poor historian in regards to timeframe in detail. She states approximately 4 weeks ago she lacerated her toe on something in the house. She followed up with her primary care who placed her on Bactrim and ultimately [name] second dose of Bactrim prior to coming to wound clinic. She states she has been treating the toe with peroxide, Betadine and [name] Band-Aid. She did not check her blood sugar this morning but checked it yesterday morning it was 327; she is unaware of [name] recent A1c and there are no current records. She saw Dr. she would've orthopedics last week for an old injury to the left ankle, she states he did  not see her toe, nor did she bring it to his attention. She smokes approximately 1 pack cigarettes [name] day. Her social situation is concerning, she arrives this morning with her mother who appears extremely intoxicated/under the influence; her mother was asked to leave the room and be monitored by the patient's grandmother. The patient's aunt then accompanied the patient and the room throughout the rest of the appointment. We had [name] lengthy discussion regarding the deleterious effects of uncontrolled hyperglycemia and smoking as it relates to wound healing and overall health. She was strongly encouraged to decrease her smoking and get her diabetes under better control. She states she is currently on [name] diet and has cut down her [facility] consumption. The left toe is erythematous, macerated and slightly edematous with malodor present. The edema in her left foot is below her baseline, there is no erythema streaking. We will treat her with Santyl, doxycycline; we have ordered and xray, culture and provided [name] Peg assist surgical shoe and cultured the wound. 01/25/18-She is here in follow-up evaluation for [name] left great toe ulcer and presents with an abscess to her suprapubic area. She states her blood sugars remain elevated, feeling "sick" and if levels are below 250, but she is trying. She has made no attempt to decrease her smoking stating that we "can't take away her food in her cigarettes". She has been compliant with offloading using the PEG assist you. She is using Santyl daily. the culture obtained last week grew staph aureus and Enterococcus faecalis; continues on the doxycycline and Augmentin was added on Monday. The suprapubic area has erythema, no femoral variation, purple discoloration, minimal induration, was accessed with [name] cotton tip applicator with sanguinopurulent drainage, this was cultured, I suspect the [name], [name] ([id number]) [id number].pdf Page 2 of  21 current antibiotic treatment will cover  and we will not add anything to her current treatment plan. She was advised to go to urgent care or ER with any change in redness, induration or fever. 02/01/18-She is here in follow-up evaluation for left great toe ulcers and [name] new abdominal abscess from last week. She was able to use packing until earlier this week, where she "forgot it was there". She states she was feeling ill with GI symptoms last week and was not taking her antibiotic. She states her glucose levels have been predominantly less than 200, with occasional levels between 200-250. She thinks this was contributing to her GI symptoms as they have resolved without intervention. There continues to be significant laceration to left toe, otherwise it clinically looks stable/improved. There is now less superficial opening to the lateral aspect of the great toe that was residual blister. We will transition to [facility] to all wounds, she will continue her Augmentin. If there is no change or deterioration next week for reculture. 02/08/18-She is here in follow-up evaluation for left great toe ulcer and abdominal ulcer. There is an improvement in both wounds. She has been wrapping her left toe with coban, not by our direction, which has created an area of discoloration to the medial aspect; she has been advised to NOT use coban secondary to her neuropathy. She states her glucose levels have been high over this last week ranging from 200-350, she continues to smoke. She admits to being less compliant with her offloading shoe. We will continue with same treatment plan and she will follow-up next week. 02/15/18-She is here in follow-up evaluation for left great toe ulcer and abdominal ulcer. The abdominal ulcer is epithelialized. The left great toe ulcer is improved and all injury from last week using the Coban wrap is resolved, the lateral ulcer is healed. She admits to noncompliance with wearing  offloading shoe and admits to glucose levels being greater than 300 most of the week. She continues to smoke and expresses no desire to quit. There is one area medially that probes deeper than it has historically, erythema to the toe and dorsal foot has consistently waxed and waned. There is no overt signs of cellulitis or infection but we will culture the wound for any occult infection given the new area of depth and erythema. We will hold off on sensitivities for initiation of antibiotic therapy. 02/22/18-She is here in follow up evaluation for left great toe ulcer. There is overall significant improvement in both wound appearance, erythema and edema with changes made last week. She was not initiated on antibiotic therapy. Culture obtained last week showed oxacillin sensitive staph aureus, sensitive to clindamycin. Clindamycin has been called into the pharmacy but she has been instructed to hold off on initiation secondary to overall clinical improvement and her history of antibiotic intolerance. She has been instructed to contact the clinic with any noted changes/deterioration and the wound, erythema, edema and/or pain. She will follow-up next week. She continues to smoke and her glucose levels remain elevated >250; she admits to compliance with offloading shoe 03/01/18 on evaluation today patient appears to be doing fairly well in regard to her left first toe ulcer. She has been tolerating the dressing changes with the [facility] Dressing without complication and overall this has definitely showed signs of improvement according to records as well is what the patient tells me today. I'm very pleased in that regard. She is having no pain today 03/08/18 She is here for follow up evaluation of [name] left  great toe ulcer. She remains non-compliant with glucose control and smoking cessation; glucose levels consistently >200. She states that she got new shoe inserts/peg assist. She admits to compliance with  offloading. Since my last evaluation there is significant improvement. We will switch to prisma at this time and she will follow up next week. She is noted to be tachycardic at this appointment, heart rate 120s; she has [name] history of heart rate 70-130 according to our records. She admits to extreme agitation r/t personal issues; she was advised to monitor her heartrate and contact her physician if it does not return to [name] more normal range (<100). She takes cardizem twice daily. 03/15/18-She is here in follow-up evaluation for left great toe ulcer. She remains noncompliant with glucose control and smoking cessation. She admits to compliance with wearing offloading shoe. The ulcer is improved/stable and we will continue with the same treatment plan and she will follow-up next week 03/22/18-She is here for evaluation for left great toe ulcer. There continues to be significant improvement despite recurrent hyperglycemia (over 500 yesterday) and she continues to smoke. She has been compliant with offloading and we will continue with same treatment plan and she will follow-up next week. 03/29/18-She is here for evaluation for left great toe ulcer. Despite continuing to smoke and uncontrolled diabetes she continues to improve. She is compliant with offloading shoe. We will continue with the same treatment plan and she will follow-up next week 04/05/18- She is here in follow up evaluation for [name] left great toe ulcer; she presents with small pustule to left fifth toe (resembles ant bite). She admits to compliance with wearing offloading shoe; continues to smoke or have uncontrolled blood glucose control. There is more callus than usual with evidence of bleeding; she denies known trauma. 04/12/18-She is here for evaluation of left great toe ulcer. Despite noncompliance with glycemic control and smoking she continues to make improvement. She continues to wear offloading shoe. The pustule, that was identified last week,  to the left fifth toe is resolved. She will follow-up in 2 weeks 05/03/18-she is seen in follow-up evaluation for [name] left great toe ulcer. She is compliant with offloading, otherwise noncompliant with glycemic control and smoking. She has plateaued and there is minimal improvement noted. We will transition to [facility], replaced the insert to her surgical shoe and she will follow-up in one week 05/10/18- She is here in follow up evaluation for [name] left great toe ulcer. It appears stable despite measurement change. We will continue with same treatment plan and follow up next week. 05/24/18-She is seen in follow-up evaluation for [name] left great toe ulcer. She remains compliant with offloading, has made significant improvement in her diet, decreasing the amount of sugar/soda. She said her recent A1c was 10.9 which is lower than. She did see [name] diabetic nutritionist/educator yesterday. She continues to smoke. We will continue with the same treatment plan and she'll follow-up next week. 05/31/18- She is seen in follow-up evaluation for left great toe ulcer. She continues to remain compliant with offloading, continues to make improvement in her diet, increasing her water and decreasing the amount of sugar/soda. She does continue to smoke with no desire to quit. We will apply Prisma to the depth and Hydrofera Blue over. We have not received insurance authorization for oasis. She will follow up next week. 06/07/18-She is seen in follow-up evaluation for left great toe ulcer. It has stalled according to today's measurements although base appears stable. She says she saw  [name] diabetic educator yesterday; her average blood sugars are less than 300 which is an improvement for her. She continues to smoke and states "that's my next step" She continues with water over soda. We will order for xray, culture and reinstate ace wrap compression prior to placing apligraf for next week. She is voicing no complaints or concerns. Her  dressing will change to iodoflex over the next week in preparation for apligraf. 06/14/18-She is seen in follow-up evaluation for left great toe ulcer. Plain film x-ray performed last week was negative for osteomyelitis. Wound culture obtained last week grew strep B and OSSA; she is initiated on keflex and cefdinir today; there is erythema to the toe which could be from ace wrap compression, she has [name] history of wrapping too tight and has has been encouraged to maintain ace wraps that we place today. We will hold off on application of apligraf today, will apply next week after antibiotic therapy has been initiated. She admits today that she has resumed taking [name] shower with her foot/toe submerged in water, she has been reminded to keep foot/toe out of the bath water. She will be seen in follow up next week 06/21/18-she is seen in follow-up evaluation for left great toe ulcer. She is tolerating antibiotic therapy with no GI disturbance. The wound is stable. Apligraf was applied today. She has been decreasing her smoking, only had 4 cigarettes yesterday and 1 today. She continues being more compliant in diabetic diet. She will follow-up next week for evaluation of site, if stable will remove at 2 weeks. 06/28/18- She is here in follow up evalution. Apligraf was placed last week, she states the dressing fell off on Tuesday and she was dressing with hydrofera blue. She is healed and will be discharged from the clinic today. She has been instructed to continue with smoking cessation, continue monitoring glucose levels, offloading for an additional 4 weeks and continue with hydrofera blue for additional two weeks for any possible microscopic opening. Readmission: 08/07/18 on evaluation today patient presents for reevaluation concerning the ulcer of her right great toe. She was previously discharged on 06/28/18 healed. Nonetheless she states that this began to show signs of drainage she subsequently went to her  primary care provider. Subsequently an x-ray was performed on 08/01/18 which was negative. The patient was also placed on antibiotics at that time. Fortunately they should have been effective for the infection. Nonetheless she's been experiencing some improvement but still has [name] lot of drainage coming from the wound itself. 08/14/18 on evaluation today patient's wound actually does show signs of improvement in regard to the erythema at this point. She has completed the antibiotics. With that being said we did discuss the possibility of placing her in [name] total contact cast as of today although I think that I may want to give this just [name] little bit more time to ensure nothing recurrence as far as her infection is concerned. I do not want to put in the cast and risk infection at that time if things are not completely resolved. With that being said she is gonna require some debridement today. 08/21/18 on evaluation today patient actually appears to be doing okay in regard to her toe ulcer. She's been tolerating the dressing changes without complication. With that being said it does appear that she is ready and in fact I think it's appropriate for [place] to go ahead and initiate the total contact cast today. Nonetheless she will require some sharp debridement to prepare  the wound for application. Overall I feel like things have been progressing well but we do need to do something to get this to close more readily. 08/24/18 patient seen today for reevaluation after having had the total contact cast applied on Tuesday. She seems to have done very well the wound appears to be doing great and overall I'm pleased with the progress that she's made. There were no abnormal areas of rubbing from the cast on her lower extremity. 08/30/18 on evaluation today patient actually appears to be completely healed in regard to her plantar toe ulcer. She tells me at this point she's been having [name] lot [name], [name] ([id number])  [id number] Page 3 of 21 of issues with the cast. She almost fell [name] couple of times the state shall the step of her dog [name] couple times as well. This is been [name] very frustrating process for her other nonetheless she has completely healed the wound which is excellent news. Overall there does not appear to be the evidence of infection at this time which is great news. 09/11/18 evaluation today patient presents for follow-up concerning her great toe ulcer on the left which has unfortunately reopened since I last saw her which was only [name] couple of weeks ago. Unfortunately she was not able to get in to get the shoe and potentially the AFO that's gonna be necessary due to her left foot drop. She continues with offloading shoe but this is not enough to prevent her from reopening it appears. When we last had her in the total contact cast she did well from [name] healing standpoint but unfortunately the wound reopened as soon as she came out of the cast within just [name] couple of weeks. Right now the biggest concern is that I do believe the foot drop is leading to the issue and this is gonna continue to be an issue unfortunately until we get things under control as far as the walking anomaly is concerned with the foot drop. This is also part of the reason why she falls on [name] regular basis. I just do not believe that is gonna be safe for [place] to reinitiate the total contact cast as last time we had this on she fell 3 times one week which is definitely not normal for her. 09/18/18 upon evaluation today the patient actually appears to be doing about the same in regard to her toe ulcer. She did not contact [employer] as I asked her to even though I had given her the prescription. In fact she actually states that she has no idea where the prescription is. She did apparently call [employer] and they told her that all she needed to do was bring the prescription in order to be able to be seen and work on  getting the AFO for her left foot. With all that being said she still does not have an appointment and I'm not sure were things stand that regard. I will give her [name] new prescription today in order to contact them to get this set up. 09/25/18 on evaluation today patient actually appears to be doing about the same in regard to her toes ulcer. She does have [name] small areas which seems to have [name] lot of callous buildup around the edge of the wound which is going to need sharp debridement today. She still is waiting to be scheduled for evaluation with [employer] for possibility of an AFO. She states there supposed to call her tomorrow to get this set up.  Unfortunately it does appear that her foot specifically the toe area is showing signs of erythema. There does not appear to be any systemic infection which is in these good news. 10/02/18 on evaluation today patient actually appears to be doing about the same in regard to her toe ulcer. This really has not done too well although it's not significantly larger it's also not significantly smaller. She has been tolerating the dressing changes without complication. She actually has her appointment with [employer] and [employer] tomorrow to hopefully be measured for obtaining and AFO splint. I think this would be helpful preventing this from reoccurring. We had contemplated starting the cast this week although to be honest I am reluctant to do that as she's been having nausea, vomiting, and seizure activity over the past three days. She has [name] history of seizures and have been told is nothing that can be done for these. With that being said I do believe that along with the seizures have the nausea vomiting which upon further questioning doesn't seem to be the normal for her and makes me concerned for the possibility of infection or something else going on. I discussed this with the patient and her mother during the office visit today. I do not feel the wound is effective  but maybe something else. The responses this was "this just happens to her at times and we don't know why". They did not seem to be interested in going to the hospital to have this checked out further. 10/09/18 on evaluation today patient presents for follow-up concerning her ongoing toe ulcer. She has been tolerating the dressing changes without complication. Fortunately there does not appear to be any evidence of infection which is great news however I do think that the patient would benefit from going ahead for with the total contact cast. She's actually in [name] wheelchair today she tells me that she will use her walker if we initiate the cast. I was very specific about the fact that if we were gonna do the cast I wanted to make sure that she was using the walker in order to prevent any falls. She tells me she does not have stairs that she has to traverse on [name] regular basis at her home. She has not had any seizures since last week again that something that happens to her often she tells me she did talk to [employer] and they said that it may take up to three weeks to get the brace approved for her. Hopefully that will not take that long but nonetheless in the meantime I do think the cast could be of benefit. 10/12/18 on evaluation today patient appears to be doing rather well in regard to her toe ulcer. It's just been [name] few days and already this is significantly improved both as far as overall appearance and size. Fortunately there's no sign of infection. She is here for her first obligatory cast change. 10/19/18 Seen today for follow up and management of left great toe ulcer. Wound continues to show improvement. Noted small open area with seroussang drainage with palpation. Denies any increased pain or recent fevers during visit. She will continue calcium alginate with offloading shoe. Denies any questions or concerns during visit. 10/26/18 on evaluation today patient appears to be doing about the same as  when I last saw her in regard to her wound bed. Fortunately there does not appear to be any signs of infection. Unfortunately she continues to have [name] breakdown in regard to the toe  region any time that she is not in the cast. It takes almost no time at all for this to happen. Nonetheless she still has not heard anything from the brace being made by [employer] as to when exactly this will be available to her. Fortunately there is no signs of infection at this time. 10/30/18 on evaluation today patient presents for application of the total contact cast as we just received him this morning. Fortunately we are gonna be able to apply this to her today which is great news. She continues to have no significant pain which is good news. Overall I do feel like things have been improving while she was the cast is when she doesn't have [name] cast that things get worse. She still has not really heard anything from [employer] regarding her brace. 11/02/18 upon evaluation today patient's wound already appears to be doing significantly better which is good news. Fortunately there does not appear to be any signs of infection also good news. Overall I do think the total contact cast as before is helping to heal this area unfortunately it's just not gonna likely keep the area closed and healed without her getting her brace at least. Again the foot drop is [name] significant issue for her. 11/09/18 on evaluation today patient appears to be doing excellent in regard to her toe ulcer which in fact is completely healed. Fortunately we finally got the situation squared away with the paperwork which was needed to proceed with getting her brace approved by Medicaid. I have filled that out unfortunately that information has been sent to the orthopedic office that I worked at 2 1/2 years ago and not tired Current wound care measures. Fortunately she seems to be doing very well at this time. 11/23/18 on evaluation today patient appears to be doing  More Poorly Compared to Last Time I Saw Her. At [facility] She Had Completely Healed. Currently she is continuing to have issues with reopening. She states that she just found out that the brace was approved through Medicaid now she just has to go get measured in order to have this fitted for her and then made. Subsequently she does not have an appointment for this yet that is going to complicate things we obviously cannot put her back in the cast if we do not have everything measured because they're not gonna be able to measure her foot while she is in the cast. 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 Obviously this is unfortunate and does have me somewhat worried as well. 11/30/18 on evaluation today patient's toe ulcer actually appears to be doing fairly well. The good news is she will be getting her brace in the shoes next week on Wednesday. Hopefully we will be able to get this to heal without having to go back in the cast however she may need the cast in order to get the wound completely heal and then go from there. Fortunately there's no signs of infection at this time. 12/07/18 on evaluation today patient fortunately did receive her brace and she states she could tell this definitely makes her walk better. With that being said she's been having issues with her toe where she noticed yesterday there was [name] lot of tissue that was loosing off this appears to be much larger than what it was previous. She also states that her leg has been read  putting much across the top of her foot just about the ankle although this seems to be receiving somewhat. The total area is still red and appears to be someone infected as best I can tell. She is previously taken Bactrim and that may be [name] good option for her today as well. We are gonna see what I wound culture shows as well and I think that this is definitely  appropriate. With that being said outside of the culture I still need to initiate something in the interim and that's what I'm gonna go ahead and select Bactrim is [name] good option for her. 12/14/18 on evaluation today patient appears to be doing better in regard to her left great toe ulcer as compared to last week's evaluation. There's still some erythema although this is significantly improved which is excellent news. Overall I do believe that she is making good progress is still gonna take some time before she is where I would like her to be from the standpoint of being able to place her back into the total contact cast. Hopefully we will be where we need to be by next week. 12/21/18 on evaluation today patient actually appears to be doing poorly in regard to her toe ulcer. She's been tolerating the dressing changes without complication. Fortunately there's no signs of systemic infection although she does have [name] lot of drainage from the toe ulcer and this does seem to be causing some issues at this point. She does have erythema on the distal portion of her toe that appears to be likely cellulitis. [name], [name] ([id number]) [id number] Page 4 of 21 12/28/18 on evaluation today patient actually appears to be doing [name] little better in my pinion in regard to her toe ulcer. With that being said she still does have some evidence of infection at this time and for her culture she had both E. coli as well as enterococcus as organisms noted on evaluation. For that reason I think that though the Keflex likely has treated the E. coli rather well this has really done nothing for the enterococcus. We are going to have to initiate treatment for this specifically. 01/04/19 on evaluation today patient's toe actually appears to be doing better from the standpoint of infection. She currently would like to see about putting the cash back on I think that this is appropriate as long as she takes  care of it and keeps it from getting wet. She is gonna have some drainage we can definitely pass this up with Drawtex and alginate to try to prevent as much drainage as possible from causing the problems. With that being said I do want to at least try her with the cast between now and Tuesday. If there any issues we can't continue to use it then I will discontinue the use of the cast at that point. 01/08/19 on evaluation today patient actually appears to be doing very well as far as her foot ulcer specifically the great toe on the left is concerned. She did have an area of rubbing on the medial aspect of her left ankle which again is from the cast. Fortunately there's no signs of infection at this point in this appears to be [name] very slight skin breakdown. The patient tells me she felt it rubbing but didn't think it was that bad. Fortunately there is no signs of active infection at this time which is good news. No fevers, chills, nausea, or vomiting noted at this time. 01/15/19 on evaluation today  patient actually appears to be doing well in regard to her toe ulcer. Again as previous she seems to do well and she has the cast on which indicates to me that during the time she doesn't have [name] cast on she's putting way too much pressure on this region. Obviously I think that's gonna be an issue as with the current national emergency concerning the Covid-19 Virus it has been recommended that we discontinue the use of total contact casting by the chief medical officer of our company, Dr. Maurine Minister. The reasoning is that if [name] patient becomes sick and cannot come into have the cast removed they could not just leave this on for an additional two weeks. Obviously the hospitals also do not want to receive patient's who are sick into the emergency department to potentially contaminate the region and spread the Covid-19 Virus among other sick individuals within the hospital system. Therefore at this point we are suspending  the use of total contact cast until the current emergency subsides. This was all discussed with the patient today as well. 01/22/19 on evaluation today patient's wound on her left great toe appears to be doing slightly worse than previously noted last week. She tells me that she has been on this quite [name] bit in fact she tells me she's been awake for 38 straight hours. This is due to the fact that she's having to care for grandparents because nobody else will. She has been taking care of them for five the last seven days since I've seen her they both have dementia his is from [name] stroke and her grandmother's was progressive. Nonetheless she states even her mom who knows her condition and situation has only help two of those days to take care of them she's been taking care of the rest. Fortunately there does not appear to be any signs of active infection in regard to her toe at this point although obviously it doesn't look as good as it did previous. I think this is directly related to her not taking off the pressure and friction by way of taking things easy. Though I completely understand what's going on. 01/29/19 on evaluation today patient's tools are actually appears to be showing some signs of improvement today compared to last week's evaluation as far as not necessarily the overall size of the wound but the fact that she has some new skin growth in between the two ends of the wound opening. Overall I feel like she has done well she states that she had [name] family member give her what sounds to be [name] CAM walker boot which has been helpful as well. 02/05/19 on evaluation today patient's wound bed actually appears to be doing significantly better in regard to her overall appearance of the size of the wound. With that being said she is still having an issue with offloading efficiently enough to get this to close. Apparently there is some signs of infection at this point as well unfortunately. Previously she's done  well of Augmentin I really do not see anything that needs to be culture currently but there theme and cellulitis of the foot that I'm seeing I'm gonna go ahead and place her on an antibiotic today to try to help clear this up. 02/12/2019 on evaluation today patient actually appears to be doing poorly in regard to her overall wound status. She tells me she has been using her offloading shoe but actually comes in today wearing her tennis shoe with the AFO brace. Again  as I previously discussed with her this is really not sufficient to allow the area to heal appropriately. Nonetheless she continues to be somewhat noncompliant and I do wonder based on what she has told my nurse in the past as to whether or not [name] good portion of this noncompliance may be recreational drug and alcohol related. She has had [name] history of heroin overdose and this was fairly recently in the past couple of months that have been seeing her. Nonetheless overall I feel like her wound looks significantly worse today compared to what it was previous. She still has significant erythema despite the Augmentin I am not sure that this is an appropriate medication for her infection I am also concerned that the infection may have gone down into her bone. 02/19/19 on evaluation today patient actually appears to be doing about the same in regard to her toe ulcer. Unfortunately she continues to show signs of bone exposure and infection at this point. There does not appear to be any evidence of worsening of the infection but I'm also not really sure that it's getting significantly better. She is on the Augmentin which should be sufficient for the Staphylococcus aureus infection that she has at this point. With that being said she may need IV antibiotics to more appropriately treat this. We did have [name] discussion today about hyperbaric option therapy. 02/28/19 on evaluation today patient actually appears to be doing much worse in regard to the wound on  her left great toe as compared to even my previous evaluation last week. Unfortunately this seems to be training in [name] pretty poor direction. Her toe was actually now starting to angle laterally and I can actually see the entire joint area of the proximal portion of the digit where is the distal portion of the digit again is no longer even in contact with the joint line. Unfortunately there's [name] lot more necrotic tissue around the edge and the toe appears to be showing signs of becoming gangrenous in my pinion. I'm very concerned about were things stand at this point. She did see infectious disease and they are planning to send in [name] prescription for Sivextro for her and apparently this has been approved. With that being said I don't think she should avoid taking this but at the same time I'm not sure that it's gonna be sufficient to save her toe at this point. She tells me that she still having to care for grandparents which I think is putting quite [name] bit of strain on her foot and specifically the total area and has caused this to break down even to [name] greater degree than would've otherwise been expected. 03/05/19 on evaluation today patient actually appears to be doing quite well in regard to her toe all things considering. She still has bone exposed but there appears to be much less your thing on overall the appearance of the wound and the toe itself is dramatically improved. She still does have some issues currently obviously with infection she did see vascular as well and there concerned that her blood flow to the toad. For that reason they are setting up for an angiogram next week. 03/14/19 on evaluation today patient appears to be doing very poor in regard to her toe and specifically in regard to the ulceration and the fact that she's starting to notice the toe was leaning even more towards the lateral aspect and the complete joint is visible on the proximal aspect of the joint. Nonetheless she's  also  noted [name] significant odor and the tip of the toe is turning more dark and necrotic appearing. Overall I think she is getting worse not better as far as this is concerned. For that reason I am recommending at this point that she likely needs to be seen for likely amputation. READMISSION 03/19/2021 This is [name] patient that we cared for in this clinic for [name] prolonged period of time in 2019 and 2020 with [name] left foot and left first toe wound. 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 Excell Seltzer. In December 2021 she had an ulcer on her right great toe as well as the fourth and fifth toes. 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 She also had an angiogram at that time and underwent angioplasty of the right anterior tibial artery. In any case she claims that the wound on the right foot is closed I did not look at this today which was probably an oversight although I think that should be done next week. 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 According to Dr. Bernette Redbird last review that she was out of the area being cared for by another physician but recently came back to his attention. 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 [name] culture of this area showed E. coli apparently before she came back to see Dr. Excell Seltzer she was supposed to be receiving antibiotics but she did not really take them. Nor is she offloading this area at all. Finally her last hemoglobin A1c listed in epic was in March 2022 at 14.1 she says things are [name] lot better since then although I am not sure. 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 She had this wound at the time. During this admission she had E. coli sepsis [name] TEE was negative. Past medical  history is extensive and includes type 2 diabetes with peripheral neuropathy cardiomyopathy with an ejection fraction of 33%, hypertension, hyperlipidemia chronic renal failure stage III history of substance abuse with cocaine although she claims to be clean now verified by her mother. She is still [name] heavy cigarette smoker. She has [name] history of bipolar disorder seizure disorder LASHAWNE, ROWLEE [name] ([id number]) [phone].pdf Page 5 of 21 ABI in our clinic was 1.05 6/1; left midfoot in the setting of [name] TMA done previously. Round circular wound with [name] "knuckle" of protruding tissue. The problem is that the knuckle was not attached to any of the surrounding granulation and this probed proximally widely I removed [name] large portion of this tissue. This wound goes with considerable undermining laterally. I do not feel any bone there was no purulence but this is [name] deep wound. 6/8; in spite of the debridement I did last week. She arrives with [name] wound looking exactly the same. [name] protruding "knuckle" of tissue nonadherent to most of the surrounding tissue. There is considerable depth around this from 6-12 o'clock at 2.7 cm and undermining of 1 cm. This does not look overtly infected and the x- ray I did last week was negative for any osseous abnormalities. We have been using silver collagen 6/15; deep tissue culture I did last week showed moderate staph aureus and moderate Pseudomonas. This will definitely require prolonged antibiotic therapy. The pathology on  the protuberant area was negative for malignancy fungus etc. 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 We have been using silver collagen. I am going to be prescribing Levaquin for 2 weeks. Her CT scan of the foot is down for 7/5 6/22; CT scan of the foot on 7 5. She says she has hardware in the left leg from her previous fracture. She is on the Levaquin for the deep tissue  culture I did that showed methicillin sensitive staph aureus and Pseudomonas. I gave her [name] 2-week supply and she will have another week. She arrives in clinic today with the same protuberant tissue however this is nonadherent to the tissue surrounding it. I am really at [name] loss to explain this unless there is underlying deep tissue infection 6/29; patient presents for 1 week follow-up. She has been using collagen to the wound bed. She reports taking her antibiotics as prescribed.She has no complaints or issues today. She denies signs of infection. 7/6; patient presents for one week followup. She has been using collagen to the wound bed. She states she is taking Levaquin however at times she is not able to keep it down. She denies signs of infection. 7/13; patient presents for 1 week follow-up. She has been using silver alginate to the wound bed. She still has nausea when taking her antibiotics. She denies signs of infection. 7/20; patient presents for 1 week follow-up. She has been using silver alginate with gentamicin cream to the wound bed. She denies any issues and has no complaints today. She denies signs of infection. 7/27; patient presents for 1 week follow-up. She continues to use silver alginate with gentamicin cream to the wound bed. She reports starting her antibiotics. She has no issues or complaints. Overall she reports stability to the wound. 8/3; patient presents for 1 week follow-up. She has been using silver alginate with gentamicin cream to the wound bed. She reports completing all antibiotics. She has no issues or complaints today. She denies signs of infection. 8/17; patient presents for 2-week follow-up. He is to use silver alginate to the wound bed. She has no issues or complaints today. She denies signs of infection. She reports her pain has improved in her foot since last clinic visit 8/24; patient presents for 1 week follow-up. She continues to use silver alginate to the  wound bed. She has no issues or complaints. She denies signs of infection. Pain is stable. 9/7; patient presents for follow-up. She missed her last week appointment due to feeling ill. She continues to use silver alginate. She has [name] new wound to the right lower extremity that is covered in eschar. She states It occurred over the past week and has no idea how it started. She currently denies signs of infection. 9/14; patient presents for follow-up. T the left foot wound she has been using gentamicin cream and silver alginate. T the right lower extremity wound she has o o been keeping this covered and has not obtain Santyl. 9/21; patient presents for follow-up. She reports using gentamicin cream and silver alginate to the left foot and Santyl to the right lower extremity wound. She has no issues or complaints today. She denies signs of infection. 9/28; patient presents for follow-up. She reports [name] new wound to her right heel. She states this occurred [name] few days ago and is progressively gotten worse. She has been trying to clean the area with [name] Q-tip and Santyl. She reports stability in the  other 2 wounds. She has been using gentamicin cream and silver alginate to the left foot and Santyl to the right lower extremity wound. 10/12; patient presents for follow-up. She reports improvement to the wound beds. She is seeing vein and vascular to discuss the potential of [name] left BKA. She states they are going to do an arteriogram. She continues to use silver alginate with dressing changes to her wounds. 11/2; patient presents for follow-up. She states she has not been doing dressing changes to the wound beds. She states she is not able to offload the areas. She reports chronic pain to her left foot wound. 11/9; patient presents for follow-up. She came in with only socks on. She states she forgot to put on shoes. It is unclear if she is doing any dressing changes. She currently denies systemic signs of  infection. 11/16; patient presents for follow-up. She came again only with socks on. She states she does not wear shoes ever. It is unclear if she does dressing changes. She currently denies systemic signs of infection. 11/23; patient presents for follow-up. She wore her shoes today. It still unclear exactly what dressing she is using for each wound but she did states she obtained Dakin's solution and has been using this to the left foot wound. She currently denies signs of infection. 11/30; patient presents for follow-up. She has no issues or complaints today. She currently denies signs of infection. 12/7; patient presents for follow-up. She has no issues or complaints today. She has been using Hydrofera Blue to the right heel wound and Dakin solution to the left foot wound. Her right anterior leg wound is healed. She currently denies signs of infection. 12/14; patient presents for follow-up. She has been using Hydrofera Blue to the right heel and Dakin's to the left foot wounds. She has no issues or complaints today. She denies signs of infection. 12/21; patient presents for follow-up. She reports using Hydrofera Blue to the right heel and Dakin's to the left foot wound. She denies signs of infection. 12/28; patient presents for follow-up. She continues to use Dakin's to the left foot wound and Hydrofera Blue to the right heel wound. She denies signs of infection. 1/4; patient presents for follow-up. She has no issues or complaints today. She denies signs of infection. 1/11; patient presents for follow-up. It is unclear if she has been dressing these wounds over the past week. She currently denies signs of infection. 1/18; patient presents for follow-up. She states she has been using Dakin's wet-to-dry dressings to the left foot. She has been using Hydrofera Blue to the right foot foot wound. She states that the anterior right leg wound has reopened and draining serous fluid. She denies signs of  infection. 1/25; patient presents for follow-up. She has no issues or complaints today. 2/1; patient presents for follow-up. She has no issues or complaints today. She denies signs of infection. 2/8; patient presents for follow-up. She has lost her surgical shoes. She did not have [name] dressing to the right heel wound. She currently denies signs of infection. 2/15; patient presents for follow-up. She reports more pain to the right heel today. She denies purulent drainage Or fever/chills 2/22; patient presents for follow-up. She reports taking clindamycin over the past week. She states that she continues to have pain to her right heel. She reports purulent drainage. Readmission 03/16/2022 [name], [name] ([id number]) [phone].pdf Page 6 of 21 Ms. Rhodora Kautz is [name] 47 year old female with [name] past medical history of type  2 diabetes, osteomyelitis to her feet, chronic systolic heart failure and bipolar disorder that presents to the clinic for bilateral feet wounds and right lower extremity wound. She was last seen in our clinic on 12/15/2021. At that time she had purulent drainage coming out of her right plantar foot and I recommended she go to the ED. She states she went to [facility] and has been there for the past 3 months. I cannot see the records. She states she had OR debridement and was on several weeks of IV antibiotics while inpatient. Since discharge she has not been taking care of the wound beds. She had nothing on her feet other than socks today. She currently denies signs of infection. 5/31; patient presents for follow-up. She has been using Dakin's wet-to-dry dressings to the wound beds on her feet bilaterally and antibiotic ointment to the right anterior leg wound. She had [name] wound culture done at last clinic visit that showed moderate Pseudomonas aeruginosa sensitive to ciprofloxacin. She currently denies systemic signs of infection. 6/14; patient  presents for follow-up. She received Keystone 5 days ago and has been using this on the wound beds. She states that last week she had to go to the hospital because she had increased warmth and erythema to the right foot. She was started on 2 oral antibiotics. She states she has been taking these. She currently denies systemic signs of infection. She has no issues or complaints today. 6/21; patient presents for follow-up. She states she has been using Keystone antibiotics to the wound beds. She has no issues or complaints today. She denies signs of infection. 6/28; patient presents for follow-up. She has been using Keystone antibiotics to the wound beds. She has no issues or complaints today. 7/12; patient presents for follow-up. Has been using Keystone antibiotics to the wound beds with calcium alginate. She has no issues or complaints today. She never followed up with her orthopedic surgeon who did the OR debridement to the right foot. We discussed the total contact cast for the left foot and patient would like to do this next week. 7/19; patient presents for follow-up. She has been using Keystone antibiotics with calcium alginate to the wound beds. She has no issues or complaints today. Patient is in agreement to do the total contact cast of the left foot today. She knows to return later this week for the obligatory cast change. 05-13-2022 upon evaluation today patient's wound which she has the cast of the left leg actually appears to be doing significantly better. Fortunately I do not see any signs of active infection locally or systemically which is great news and overall I am extremely pleased with where we stand currently. 7/26; patient presents for follow-up. She has [name] cast in place for the past week. She states it irritated her shin. Other than that she tolerated the cast well. She states she would like [name] break for 1 week from the cast. We have been using Keystone antibiotic and Aquacel to both  wound beds. She denies signs of infection. 8/2; patient presents for follow-up. She has been using Keystone and Aquacel to the wound beds. She denies any issues and has no complaints. She is agreeable to have the cast placed today for the left leg. 06-03-2022 upon evaluation today patient appears to be doing well with regard to her wound she saw some signs of improvement which is great news. Fortunately I do not see any evidence of active infection locally or systemically at this  time which is great news. No fevers, chills, nausea, vomiting, or diarrhea. 8/16; patient presents for follow-up. She has no issues or complaints today. We have been using Keystone and Aquacel to the wound beds. The left lower extremity is in [name] total contact cast. She is tolerated this well. 8/23; patient presents for follow-up. She has had the total contact cast on the left leg for the past week. Unfortunately this has rubbed and broken down the skin to the medial foot. She currently denies signs of infection. She has been using Keystone antibiotic to the right plantar foot wound. 8/30; patient presents for follow-up. We have held off on the total contact cast for the left leg for the past week. Her wound on the left foot has improved and the previous surrounding breakdown of skin has epithelialized. She has been using Keystone antibiotic to both wound beds. She has no issues or complaints today. She denies signs of infection. 9/6; patient presents for follow-up. She has ordered her's Keystone antibiotic and this is arriving this week. She has been doing Dakin's wet-to-dry dressings to the wound beds. She denies signs of infection. She is agreeable to the total contact cast today. 9/13; patient presents for follow-up. She states that the cast caused her left leg shin to be sore. She would like to take [name] break from the cast this week. She has been using Keystone antibiotic to the right plantar foot wound. She denies signs of  infection. 9/20; patient presents for follow-up. She has been using Keystone antibiotics to the wound beds with calcium alginate to the right foot wound and Hydrofera Blue to the left foot wound. She is agreeable to having the cast placed today. She has been approved for Apligraf and we will order this for next clinic visit. 9/27; patient presents for follow-up. We have been using Keystone antibiotic with Hydrofera Blue to the left foot wound under [name] total contact cast. T the right o foot wound she has been using Keystone antibiotic and calcium alginate. She declines [name] total contact cast today. Apligraf is available for placement and she would like to proceed with this. 07-28-2022 upon evaluation today patient appears to be doing well currently in regard to her wound. She is actually showing signs of significant improvement which is great news. Fortunately I do not see any evidence of active infection locally nor systemically at this time. She has been seeing Dr. Mikey Bussing and to be honest has been doing very well with the cast. Subsequently she comes in today with [name] cast on and we did reapply that today as well. She did not really want to she try to talk me out of that but I explained that if she wanted to heal this is really the right way to go. Patient voiced understanding. In regard to her right foot this is actually [name] lot better compared to the last time I saw her which is also great news. 10/11; patient presents for follow-up. Apligraf and the total contact cast was placed to the left leg at last clinic visit. She states that her right foot wound had burning pain to it with the placement of Apligraf to this area. She has been doing Warden over this area. She denies signs of infection including increased warmth, erythema or purulent drainage. 11/1; 3-week follow-up. The patient fortunately did not have [name] total contact cast or an Apligraf and on the left foot. She has been using Keystone ABD  pads and kerlix and her own running  shoes She arrives in clinic today with thick callus and [name] very poor surface on the left foot on the right nonviable skin subcutaneous tissue and [name] deep probing hole. 11/15; patient missed her last clinic appointment. She states she has not been dressing the wound beds for the past 2 weeks. She states that at she had [name] new roommate but is now going back to live with her mother. Apparently its been [name] distracting 2 weeks. Patient currently denies signs of infection. 11/22; patient presents for follow-up. She states she has been using Keystone antibiotic and Dakin's wet-to-dry dressings to the wound beds. She is agreeable for cast placement today. We had ordered Apligraf however this has not been received by our facility. 11/29; Patient had [name] total contact cast placed at last clinic visit and she tolerated this well. We were using silver alginate under the cast. Patient's been using Keystone antibiotic with Aquacel to the right plantar foot wound. She has no issues or complaints today. Apligraf is available for placement today. Patient would like to proceed with this. 12/6; patient presents for follow-up. She had Apligraf placed in standard fashion last clinic visit under the total contact cast to the left lower extremity. She has been using Keystone antibiotic and Aquacel to the right plantar foot wound. She has no issues or complaints today. 12/13; patient presents for follow-up. She has finished 5 Apligraf placements. Was told she would not qualify for more. We have been doing [name] total contact [place], Earnestene [name] ([id number]) [phone].pdf Page 7 of 21 cast to the left lower extremity. She has been using Keystone antibiotic and Aquacel to the right plantar foot wound. She has no issues or complaints today. 12/20; patient presents for follow-up. We have been using Hydrofera Blue with Keystone antibiotic under [name] total contact cast of the left lower  extremity. She reports using Keystone antibiotic and silver alginate to the right heel wound. She has no issues or complaints today. 12/27; patient presents with [name] healthy wound on the left midfoot. We have Apligraf to apply that to that more also using [name] total contact cast. On the right we are using Keystone and silver alginate. She is offloading the right heel with [name] surgical shoealthough by her admission she is on her feet quite [name] bit 1/3; patient presents for follow-up. Apligraf was placed to the wound beds last clinic visit. She was placed in [name] total contact cast to the left lower extremity. She declines [name] total contact cast today. She states that her mother is in the hospital and she cannot adequately get around with the cast on. 1/10; patient presents for follow-up. She declined the total contact cast at last clinic visit. Both wounds have declined in appearance. She states that she has been on her feet and not offloading the wound beds. She currently denies signs of infection. 1/17; patient presents for follow-up. She had the total contact cast along with Apligraf placed last week to the left lower extremity. She tolerated this well. She has been using Aquacel Ag and Keystone antibiotic to the right heel wound. She currently denies signs of infection. She has no issues or complaints today. 1/24; patient presents for follow-up. We have been using Apligraf to the left foot wound along with [name] total contact cast. She has done well with this. T the right o heel wound she has been using Aquacel Ag and Keystone antibiotic ointment. She has no issues or complaints today. She denies signs of infection. 1/31; patient presents  for follow-up. We have been using Apligraf to the left foot wound along with the total contact cast. She continues to do well with this. To the right heel we have been using Aquacel Ag and Keystone antibiotic ointment. She has no issues or complaints today. 12-01-2022 upon evaluation  patient is seen today on my schedule due to the fact that she unfortunately was in the hospital yesterday. Her cast needed to come off the only reason she is out of the hospital is due to the fact that they would not take it off at the hospital which is somewhat bewildered reading to me to be perfectly honest. I am not certain why this was but either way she was released and then was placed on my schedule today in order to get this off and reapply the total contact casting as appropriate. I do not have an Apligraf for her today it was applied last week and today's actually expired yesterday as there was some scheduling conflicts with her being in the hospital. Nonetheless we do not have that for reapplication today but the good news that she is not draining too much and the Apligraf can go for up to 2 weeks so I am going to go ahead and reapply the total contact casting but we are going to leave the Apligraf in place. 2/14; patient presents for follow-up. T the left leg she has had the total contact cast and Apligraf for the past week. She has had no issues with this. T the o o right heel she has been using antibiotic ointment and Aquacel Ag. 2/21; patient presents for follow-up. We have been using Apligraf and [name] total contact cast to the left lower extremity. She is tolerated this well. Unfortunately she is not approved for any more Apligraf per insurance. She has been using antibiotic ointment and Aquacel Ag to the right foot. She has no issues or complaints today. 2/28; patient presents for follow-up. We have been using Hydrofera Blue and antibiotic ointment under the total contact cast to the left lower extremity. He has been using Aquacel Ag with antibiotic ointment to the right plantar foot. She has no issues or complaints today. 3/6; patient presents for follow-up. She did not obtain her gentamicin ointment. She has been using Aquacel Ag to the right plantar foot wound. We have been using  Hydrofera Blue with antibiotic ointment under the total contact cast to the left lower extremity. She has no issues or complaints today. 3/12; patient presents for follow-up. She has been using gentamicin ointment and Aquacel Ag to the right plantar foot wound. We have been using Hydrofera Blue with antibiotic ointment under the total contact cast on the left lower extremity foot wound. She has no issues or complaints today. 3/20; patient presents for follow-up. She has been using gentamicin ointment and Aquacel Ag to the right plantar foot wound. We have been using Hydrofera Blue with antibiotic ointment under the total contact cast to the left lower extremity wound. She has no issues or complaints today. 3/27; patient presents for follow-up. We have been using antibiotic ointment and Aquacel Ag to the right plantar foot wound. We have been using Hydrofera Blue with antibiotic ointment under the total contact cast to the left lower extremity. Both wounds are smaller. 01-24-2023 upon evaluation today patient actually appears to be doing better compared to last time I saw her. It has been several weeks since I last saw her but nonetheless I think the total contact casting is  doing [name] good job. Fortunately I do not see any evidence of infection or worsening overall which is great news and in general I do believe that she is moving in the appropriate direction. It has been [name] very long road but nonetheless I feel like she is nearing the end at least in regard to the left foot and even the right foot looks much better to be perfectly honest. 01-31-2023 upon evaluation today patient appears to be doing well currently in regard to her wounds. She has been require some sharp debridement which I think is probably bone both sides to remove [name] lot of callus that has built up at this point. I discussed that with her today. Also think total contact cast is doing well for the left foot [name] continue that as well. 4/17;  patient presents for follow-up. We have been doing Hydrofera Blue with antibiotic ointment under the total contact cast to the left lower extremity and Aquacel Ag with antibiotic ointment to the right foot wound. She has been using her offloading heel shoe here. 4/24; patient presents for follow-up. We have been doing Hydrofera Blue with antibiotic ointment under the total contact cast to the left lower extremity and Aquacel Ag with antibiotic ointment to the right foot wound. She has been using her offloading heel shoe here. Wounds are smaller with slough accumulation. 5/1; patient presents for follow-up. We have been doing Hydrofera Blue with antibiotic ointment under the total contact cast to the left lower extremity and Aquacel Ag with antibiotic ointment to the right foot wound. Left foot wound is stable and right foot wound appears smaller. 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 On the left she is using Hydrofera Blue topical antibiotic under [name] total contact cast, on the right plantar heel gentamicin Aquacel Ag. According to our intake nurse both are making slow but steady improvements. 5/15; patient presents for follow-up. We have been doing Hydrofera Blue with antibiotic ointment under the total contact cast on the left and antibiotic ointment with Aquacel Ag on the right. Unfortunately the left foot wound has declined in size and appearance. We also do not have the total contact cast available in office today. Patient denies signs of infection. 5/22; patient presents for follow-up. We have been doing Hydrofera Blue and antibiotic ointment to the left foot and Aquacel Ag with antibiotic ointment to the right foot. We took [name] break from the cast since we did not have it placed at last week. Wounds look stable if not slightly improved. 03-27-2023 patient presents today she has had this For Most 2 Weeks Because She Was in the Hospital and They Did Not T It off. Again That Is Really  Not ake Something That She Could Control but Nonetheless We Are Seeing Her at This Point Today for Reevaluation. We Do Want to Go and See about Reapply the Cast. Again We Had All Set Everything up to Go Ahead and Do This When We Went to Get the Cast Only to Realize That Not Had Come in and We Were out at the Moment. With That Being [place] Spaniel We Will Get Have T Get the Exam before We Can Reapply the T Contact Casting Going Forward. o otal 6/12; patient presents for follow-up. She has been using Hydrofera Blue to the left foot wound and Aquacel Ag to the right foot wound. No cast was available last week placed on the patient so she has been offloading with her surgical shoe. She has no issues or  complaints today. 6/19; patient presents for follow-up. We have been using Hydrofera Blue to the left foot wound under the total contact cast and Aquacel Ag to the right wound. She has no issues or complaints today. The wounds are smaller. 6/26; patient presents for follow-up. We have been using Hydrofera Blue to the left foot wound under the total contact cast and Aquacel Ag to the right foot [place], [place] [name] ([id number]) [id number].pdf Page 8 of 21 wound. Wounds are smaller. Patient has no issues or complaints today. 7/3; patient presents for follow-up. We have been using Hydrofera Blue to the left foot wound under the total contact cast and Aquacel Ag to the right foot wound. Wounds Appear smaller. No signs of infection. 7/10; patient presents for follow-up. We have been using Hydrofera Blue to the left foot wound under the total contact cast and Aquacel Ag to the right foot wound. Left foot wound is smaller. Right foot wound is stable. 7/17; patient presents for follow-up. We have been using Hydrofera Blue to the left foot under the total contact cast and Aquacel Ag to the right foot wound. She has no issues or complaints today. 7/24; patient presents for follow-up. We have been using  Hydrofera Blue to the left foot under the total contact cast and Aquacel Ag to the right foot wound. Unfortunately she was bit by her dog over the weekend and developed [name] wound to the right lower leg. She has been keeping the area covered. 7/31; patient presents for follow-up. We have been using Hydrofera Blue to the left foot under the total contact cast and Aquacel Ag to the right foot wound and mupirocin ointment to the right anterior leg wound. She started her oral antibiotics 2 days ago. 8/7; patient presents for follow-up. We have been using Hydrofera Blue and antibiotic ointment to the left foot under the total contact cast and Aquacel Ag to the right foot wound. Antibiotic ointment to the right anterior leg wound. She completed her oral antibiotics. She has no issues or complaints today. Wounds continue to improve albeit slowly. 8/14; 8/14; patient has 3 wounds 1 on the left plantar heel 1 on the right plantar heel and [name] small wound on the right anterior lower leg. We have been using Hydrofera Blue in the left heel Aquacel on the right heel. The left heel has been in [name] total contact cast 8/21; patient presents for follow-up. We have been using Hydrofera Blue to the left foot wound under the total contact cast. T the right heel patient has been o using Aquacel Ag. T the anterior leg wound antibiotic ointment. All wounds have healed. o 8/28; patient presents for follow-up. She has been using her surgical shoe with offloading felt pads. Her wounds have remained closed. 9/11; patient presents for final follow-up to assure that her wounds have remained closed. Unfortunately the left foot wound has opened again. But she has been wearing her Surgical shoe for offloading with felt pad. 07-14-2023 upon evaluation today patient appears to be doing well currently in regard to her wound. She has been tolerating the dressing changes without complication with total contact casting. I am going to be helping  take care of her at this point as she is transferring from Dr. Mikey Bussing to me since Dr. Mikey Bussing is on maternity leave. 07-24-2023 upon evaluation today patient appears to be doing decently well currently in regard to her wound. She is actually showing signs of improvement in regard to the left foot and I  feel like this is much better than last week no debridement even necessary today. We may have to perform some slight debridement come next week we will see how things go. Monitor for any signs of worsening overall and obviously if anything changes she should be letting us know. 08-01-2023 upon evaluation today patient appears to be doing excellent in regard to her feet. The right foot/heel area is completely healed she does have some callus buildup I am going to trim this down [name] little bit I do not want to build up to the point of cracking she is doing well with the urea cream. With regard to the left heel she is doing well with casting I am going to perform some debridement here to clear away before reapplying the cast and I think she is really doing quite well though. 08-07-2023 upon evaluation patient's wound actually showed signs of excellent improvement and actually very pleased with where we stand I do believe that she is making good headway towards complete closure which is great news. 08-14-2023 upon evaluation today patient appears to be doing well currently in regard to her wound. She actually is going require [name] little bit of debridement but fortunately nothing too significant today actually feel like she is doing much better. This is mainly to remove callus on the little slough and biofilm down to good subcutaneous tissue. In general I think that she is making excellent headway here. 08-21-2023 upon evaluation today patient appears to be doing well currently in regard to her wound which is actually showing signs of significant improvement. I am extremely pleased with where things stand I do  not see any signs of infection at this time which is great news and overall I do think that we are making headway towards complete closure which is also excellent news. 08-30-2023 upon evaluation today patient's wound is showing signs of continued improvement. Fortunately I do not see any evidence of worsening overall and I believe that the patient is making excellent headway towards closure. I am extremely pleased with where we stand. 09-04-2023 upon evaluation today patient appears to be doing well currently in regard to her foot ulcer. She has been tolerating the dressing changes without complication. Fortunately I do not see any signs of active infection locally or systemically at this time which is great news. No fevers, chills, nausea, vomiting, or diarrhea. 09-11-2023 upon evaluation today patient appears to be doing well currently in regard to her wound on the left foot. I feel like she is actually showing signs of improvement I feel like the collagen is doing [name] good job. With that being said unfortunately she has an area where there is [name] spot on the right foot that has not really open but at least there looks like there may have been something that popped off of this area. Electronic Signature(s) Signed: 09/11/2023 10:13:51 AM By: [name] PA-C Entered By: [name] on 09/11/2023 07:13:50 Physical Exam Details -------------------------------------------------------------------------------- [name] ([id number]) [id number] Page 9 of 21 Patient Name: Date of Service: [name], [name] 09/11/2023 9:00 [name]: [id number] Patient Account Number: [id number] Date of Birth/Sex: Treating RN: 1975-12-15 (47 y.o. [name] Primary Care Provider: [name] Other Clinician: [name] Referring Provider: Treating Provider/Extender: [name] in Treatment: 68 Constitutional Well-nourished and  well-hydrated in no acute distress. Respiratory normal breathing without difficulty. Psychiatric this patient is able to make decisions and demonstrates good insight into disease process.  Alert and Oriented x 3. pleasant and cooperative. Notes Upon inspection patient's wound did not require sharp debridement today which I discussed with the patient. I think that she does have an issue however with [name] small area that is not open but it looks to be [name] circular region on the right heel which would not have to keep an eye on. Electronic Signature(s) Signed: 09/11/2023 10:35:44 AM By: [name] PA-C Entered By: [name] on 09/11/2023 07:35:44 -------------------------------------------------------------------------------- Physician Orders Details Patient Name: Date of Service: [name] 09/11/2023 9:00 [name]: [id number] Patient Account Number: [id number] Date of Birth/Sex: Treating RN: December 25, 1975 (47 y.o. 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 Bathing/ Shower/ Hygiene May shower; gently cleanse wound with antibacterial soap, rinse and pat dry prior to dressing wounds No tub bath. Off-Loading Total Contact Cast to Left Lower Extremity Open toe surgical  shoe - right foot EMAGENE, [name] ([id number]) [phone].pdf Page 10 of 21 Wound Treatment Wound #12R - Foot Wound Laterality: Plantar, Left, Medial Cleanser: Vashe 5.8 (oz) 1 x Per Week/30 Days Discharge Instructions: Use vashe 5.8 (oz) as directed Prim Dressing: Promogran Matrix 4.34 (in) 1 x Per Week/30 Days ary Discharge Instructions: Moisten w/normal saline or sterile water and apply into wound bed Prim Dressing: Silvercel Small 2x2 (in/in) 1 x Per Week/30 Days ary Discharge Instructions: Apply Silvercel Small 2x2 (in/in) over wound area Secondary Dressing: Gauze 1 x Per Week/30 Days Discharge Instructions: As directed: dry, moistened with saline or moistened with Dakins Solution Secured With: [employer] Sterile or Non-Sterile 6-ply 4.5x4 (yd/yd) 1 x Per Week/30 Days Discharge Instructions: Apply Kerlix as directed Electronic Signature(s) Signed: 09/12/2023 4:00:09 PM By: [name] PA-C Signed: 09/12/2023 4:17:00 PM By: [name] Entered By: [name] on 09/11/2023 06:56:45 -------------------------------------------------------------------------------- Problem List Details Patient Name: Date of Service: [name] 09/11/2023 9:00 [name]: [id number] Patient Account Number: [id number] Date of Birth/Sex: Treating RN: 08-13-1976 (47 y.o. 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 03/16/2022 No Yes severity E11.42 Type 2 diabetes mellitus with diabetic polyneuropathy 03/16/2022 No Yes Z79.4 Long term (current) use of insulin 02/22/2023 No Yes N18.6 End  stage renal disease 07/14/2023 No Yes Z99.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 07/14/2023 No Yes Widen, Austine [name] ([id number]) [phone].pdf Page 11 of 21 L84 Corns and callosities 08/01/2023 No Yes Inactive Problems Resolved Problems ICD-10 Code Description Active Date Resolved Date L97.512 Non-pressure chronic ulcer of other part of right foot with fat layer exposed 03/16/2022 03/16/2022 L97.811 Non-pressure chronic ulcer of other part of right lower leg limited to breakdown of  skin 03/16/2022 03/16/2022 W54.0XXA Bitten by dog, initial encounter 05/17/2023 05/17/2023 S81.801A Unspecified open wound, right lower leg, initial encounter 05/17/2023 05/17/2023 B14.782 Type 2 diabetes mellitus with other skin ulcer 05/17/2023 05/17/2023 Electronic Signature(s) Signed: 09/11/2023 8:59:46 AM By: [name] PA-C Entered By: [name] on 09/11/2023 05:59:46 -------------------------------------------------------------------------------- Progress Note Details Patient Name: Date of Service: [name] 09/11/2023 9:00 [name]: [id number] Patient Account Number: [id number] Date of Birth/Sex: Treating RN: [birth year] (47 y.o. [name] Primary Care Provider: [name] Other Clinician: [name] Referring Provider: Treating Provider/Extender: [name] in Treatment: 89 Subjective Chief Complaint Information obtained from Patient Left foot ulcer History of Present Illness (HPI) 01/18/18-She is here for initial evaluation of the left great toe ulcer. She is [name] poor historian in regards to timeframe in detail. She states approximately 4 weeks ago she lacerated her toe on something in the house. She followed up with her primary care who placed her on Bactrim and ultimately [name] second dose of Bactrim prior to coming to wound clinic. She states she has been treating the toe with peroxide, Betadine and [name] Band-Aid. She did not check her  blood sugar this morning but checked it yesterday morning it was 327; she is unaware of [name] recent A1c and there are no current records. She saw Dr. she would've orthopedics last week for an old injury to the left ankle, she states he did not see her toe, nor did she bring it to his attention. She smokes approximately 1 pack cigarettes [name] day. Her social situation is concerning, she arrives this morning with her mother who appears extremely intoxicated/under the influence; her mother was asked to leave the room and be monitored by the patient's grandmother. The patient's aunt then accompanied the patient and the room throughout the rest of the appointment. We had [name] lengthy discussion regarding the deleterious effects of uncontrolled hyperglycemia and smoking as it relates to wound healing and overall health. She was strongly encouraged to decrease her smoking and get her diabetes under better control. She states she is currently on [name] diet and has cut down her [facility] consumption. The left toe is erythematous, macerated and slightly edematous with malodor present. The edema in her left foot is below her baseline, there is no erythema streaking. We will treat her with Santyl, doxycycline; we have ordered and xray, culture and provided [name] Peg assist surgical shoe and cultured the wound. 01/25/18-She is here in follow-up evaluation for [name] left great toe ulcer and presents with an abscess to her suprapubic area. She states her blood sugars remain elevated, feeling "sick" and if levels are below 250, but she is trying. She has made no attempt to decrease her smoking stating that we "can't take away her ROCKELLE, KUPERUS [name] ([id number]) [id number] Page 12 of 21 food in her cigarettes". She has been compliant with offloading using the PEG assist you. She is using Santyl daily. the culture obtained last week grew staph aureus and Enterococcus faecalis; continues on the doxycycline and  Augmentin was added on Monday. The suprapubic area has erythema, no femoral variation, purple discoloration, minimal induration, was accessed with [name] cotton tip applicator with sanguinopurulent drainage, this was cultured, I suspect the current antibiotic treatment will cover and we will not add anything to her current treatment plan. She was advised to go to urgent care or ER with any change in redness, induration or fever. 02/01/18-She is here in follow-up evaluation for  left great toe ulcers and [name] new abdominal abscess from last week. She was able to use packing until earlier this week, where she "forgot it was there". She states she was feeling ill with GI symptoms last week and was not taking her antibiotic. She states her glucose levels have been predominantly less than 200, with occasional levels between 200-250. She thinks this was contributing to her GI symptoms as they have resolved without intervention. There continues to be significant laceration to left toe, otherwise it clinically looks stable/improved. There is now less superficial opening to the lateral aspect of the great toe that was residual blister. We will transition to [facility] to all wounds, she will continue her Augmentin. If there is no change or deterioration next week for reculture. 02/08/18-She is here in follow-up evaluation for left great toe ulcer and abdominal ulcer. There is an improvement in both wounds. She has been wrapping her left toe with coban, not by our direction, which has created an area of discoloration to the medial aspect; she has been advised to NOT use coban secondary to her neuropathy. She states her glucose levels have been high over this last week ranging from 200-350, she continues to smoke. She admits to being less compliant with her offloading shoe. We will continue with same treatment plan and she will follow-up next week. 02/15/18-She is here in follow-up evaluation for left great toe ulcer and  abdominal ulcer. The abdominal ulcer is epithelialized. The left great toe ulcer is improved and all injury from last week using the Coban wrap is resolved, the lateral ulcer is healed. She admits to noncompliance with wearing offloading shoe and admits to glucose levels being greater than 300 most of the week. She continues to smoke and expresses no desire to quit. There is one area medially that probes deeper than it has historically, erythema to the toe and dorsal foot has consistently waxed and waned. There is no overt signs of cellulitis or infection but we will culture the wound for any occult infection given the new area of depth and erythema. We will hold off on sensitivities for initiation of antibiotic therapy. 02/22/18-She is here in follow up evaluation for left great toe ulcer. There is overall significant improvement in both wound appearance, erythema and edema with changes made last week. She was not initiated on antibiotic therapy. Culture obtained last week showed oxacillin sensitive staph aureus, sensitive to clindamycin. Clindamycin has been called into the pharmacy but she has been instructed to hold off on initiation secondary to overall clinical improvement and her history of antibiotic intolerance. She has been instructed to contact the clinic with any noted changes/deterioration and the wound, erythema, edema and/or pain. She will follow-up next week. She continues to smoke and her glucose levels remain elevated >250; she admits to compliance with offloading shoe 03/01/18 on evaluation today patient appears to be doing fairly well in regard to her left first toe ulcer. She has been tolerating the dressing changes with the [facility] Dressing without complication and overall this has definitely showed signs of improvement according to records as well is what the patient tells me today. I'm very pleased in that regard. She is having no pain today 03/08/18 She is here for follow up  evaluation of [name] left great toe ulcer. She remains non-compliant with glucose control and smoking cessation; glucose levels consistently >200. She states that she got new shoe inserts/peg assist. She admits to compliance with offloading. Since my last evaluation  there is significant improvement. We will switch to prisma at this time and she will follow up next week. She is noted to be tachycardic at this appointment, heart rate 120s; she has [name] history of heart rate 70-130 according to our records. She admits to extreme agitation r/t personal issues; she was advised to monitor her heartrate and contact her physician if it does not return to [name] more normal range (<100). She takes cardizem twice daily. 03/15/18-She is here in follow-up evaluation for left great toe ulcer. She remains noncompliant with glucose control and smoking cessation. She admits to compliance with wearing offloading shoe. The ulcer is improved/stable and we will continue with the same treatment plan and she will follow-up next week 03/22/18-She is here for evaluation for left great toe ulcer. There continues to be significant improvement despite recurrent hyperglycemia (over 500 yesterday) and she continues to smoke. She has been compliant with offloading and we will continue with same treatment plan and she will follow-up next week. 03/29/18-She is here for evaluation for left great toe ulcer. Despite continuing to smoke and uncontrolled diabetes she continues to improve. She is compliant with offloading shoe. We will continue with the same treatment plan and she will follow-up next week 04/05/18- She is here in follow up evaluation for [name] left great toe ulcer; she presents with small pustule to left fifth toe (resembles ant bite). She admits to compliance with wearing offloading shoe; continues to smoke or have uncontrolled blood glucose control. There is more callus than usual with evidence of bleeding; she denies known  trauma. 04/12/18-She is here for evaluation of left great toe ulcer. Despite noncompliance with glycemic control and smoking she continues to make improvement. She continues to wear offloading shoe. The pustule, that was identified last week, to the left fifth toe is resolved. She will follow-up in 2 weeks 05/03/18-she is seen in follow-up evaluation for [name] left great toe ulcer. She is compliant with offloading, otherwise noncompliant with glycemic control and smoking. She has plateaued and there is minimal improvement noted. We will transition to [facility], replaced the insert to her surgical shoe and she will follow-up in one week 05/10/18- She is here in follow up evaluation for [name] left great toe ulcer. It appears stable despite measurement change. We will continue with same treatment plan and follow up next week. 05/24/18-She is seen in follow-up evaluation for [name] left great toe ulcer. She remains compliant with offloading, has made significant improvement in her diet, decreasing the amount of sugar/soda. She said her recent A1c was 10.9 which is lower than. She did see [name] diabetic nutritionist/educator yesterday. She continues to smoke. We will continue with the same treatment plan and she'll follow-up next week. 05/31/18- She is seen in follow-up evaluation for left great toe ulcer. She continues to remain compliant with offloading, continues to make improvement in her diet, increasing her water and decreasing the amount of sugar/soda. She does continue to smoke with no desire to quit. We will apply Prisma to the depth and Hydrofera Blue over. We have not received insurance authorization for oasis. She will follow up next week. 06/07/18-She is seen in follow-up evaluation for left great toe ulcer. It has stalled according to today's measurements although base appears stable. She says she saw [name] diabetic educator yesterday; her average blood sugars are less than 300 which is an improvement for her. She  continues to smoke and states "that's my next step" She continues with water over soda. We  will order for xray, culture and reinstate ace wrap compression prior to placing apligraf for next week. She is voicing no complaints or concerns. Her dressing will change to iodoflex over the next week in preparation for apligraf. 06/14/18-She is seen in follow-up evaluation for left great toe ulcer. Plain film x-ray performed last week was negative for osteomyelitis. Wound culture obtained last week grew strep B and OSSA; she is initiated on keflex and cefdinir today; there is erythema to the toe which could be from ace wrap compression, she has [name] history of wrapping too tight and has has been encouraged to maintain ace wraps that we place today. We will hold off on application of apligraf today, will apply next week after antibiotic therapy has been initiated. She admits today that she has resumed taking [name] shower with her foot/toe submerged in water, she has been reminded to keep foot/toe out of the bath water. She will be seen in follow up next week 06/21/18-she is seen in follow-up evaluation for left great toe ulcer. She is tolerating antibiotic therapy with no GI disturbance. The wound is stable. Apligraf was applied today. She has been decreasing her smoking, only had 4 cigarettes yesterday and 1 today. She continues being more compliant in diabetic diet. She will follow-up next week for evaluation of site, if stable will remove at 2 weeks. 06/28/18- She is here in follow up evalution. Apligraf was placed last week, she states the dressing fell off on Tuesday and she was dressing with hydrofera blue. She is healed and will be discharged from the clinic today. She has been instructed to continue with smoking cessation, continue monitoring glucose levels, offloading for an additional 4 weeks and continue with hydrofera blue for additional two weeks for any possible microscopic opening. Readmission: 08/07/18 on  evaluation today patient presents for reevaluation concerning the ulcer of her right great toe. She was previously discharged on 06/28/18 healed. Nonetheless she states that this began to show signs of drainage she subsequently went to her primary care provider. Subsequently an x-ray was performed on 08/01/18 which was negative. The patient was also placed on antibiotics at that time. Fortunately they should have been effective for the infection. Nonetheless she's been experiencing some improvement but still has [name] lot of drainage coming from the wound itself. 08/14/18 on evaluation today patient's wound actually does show signs of improvement in regard to the erythema at this point. She has completed the antibiotics. With that being said we did discuss the possibility of placing her in [name] total contact cast as of today although I think that I may want to give this just [name] little bit more time to ensure nothing recurrence as far as her infection is concerned. I do not want to put in the cast and risk infection at that time if things are not completely resolved. With that being said she is gonna require some debridement today. 08/21/18 on evaluation today patient actually appears to be doing okay in regard to her toe ulcer. She's been tolerating the dressing changes without complication. With that being said it does appear that she is ready and in fact I think it's appropriate for [place] to go ahead and initiate the total contact cast today. Nonetheless she will require some sharp debridement to prepare the wound for application. Overall I feel like things have been progressing well but we do need to do something to get this to close more readily. 08/24/18 patient seen today for reevaluation after having had  the total contact cast applied on Tuesday. She seems to have done very well the wound appears HYDIA, BEYAH [name] ([id number]) [phone].pdf Page 13 of 21 to be doing great and overall  I'm pleased with the progress that she's made. There were no abnormal areas of rubbing from the cast on her lower extremity. 08/30/18 on evaluation today patient actually appears to be completely healed in regard to her plantar toe ulcer. She tells me at this point she's been having [name] lot of issues with the cast. She almost fell [name] couple of times the state shall the step of her dog [name] couple times as well. This is been [name] very frustrating process for her other nonetheless she has completely healed the wound which is excellent news. Overall there does not appear to be the evidence of infection at this time which is great news. 09/11/18 evaluation today patient presents for follow-up concerning her great toe ulcer on the left which has unfortunately reopened since I last saw her which was only [name] couple of weeks ago. Unfortunately she was not able to get in to get the shoe and potentially the AFO that's gonna be necessary due to her left foot drop. She continues with offloading shoe but this is not enough to prevent her from reopening it appears. When we last had her in the total contact cast she did well from [name] healing standpoint but unfortunately the wound reopened as soon as she came out of the cast within just [name] couple of weeks. Right now the biggest concern is that I do believe the foot drop is leading to the issue and this is gonna continue to be an issue unfortunately until we get things under control as far as the walking anomaly is concerned with the foot drop. This is also part of the reason why she falls on [name] regular basis. I just do not believe that is gonna be safe for [place] to reinitiate the total contact cast as last time we had this on she fell 3 times one week which is definitely not normal for her. 09/18/18 upon evaluation today the patient actually appears to be doing about the same in regard to her toe ulcer. She did not contact [employer] as I asked her to even though I had given her the  prescription. In fact she actually states that she has no idea where the prescription is. She did apparently call [employer] and they told her that all she needed to do was bring the prescription in order to be able to be seen and work on getting the AFO for her left foot. With all that being said she still does not have an appointment and I'm not sure were things stand that regard. I will give her [name] new prescription today in order to contact them to get this set up. 09/25/18 on evaluation today patient actually appears to be doing about the same in regard to her toes ulcer. She does have [name] small areas which seems to have [name] lot of callous buildup around the edge of the wound which is going to need sharp debridement today. She still is waiting to be scheduled for evaluation with [employer] for possibility of an AFO. She states there supposed to call her tomorrow to get this set up. Unfortunately it does appear that her foot specifically the toe area is showing signs of erythema. There does not appear to be any systemic infection which is in these good news. 10/02/18 on evaluation today patient  actually appears to be doing about the same in regard to her toe ulcer. This really has not done too well although it's not significantly larger it's also not significantly smaller. She has been tolerating the dressing changes without complication. She actually has her appointment with [facility] and [place] tomorrow to hopefully be measured for obtaining and AFO splint. I think this would be helpful preventing this from reoccurring. We had contemplated starting the cast this week although to be honest I am reluctant to do that as she's been having nausea, vomiting, and seizure activity over the past three days. She has [name] history of seizures and have been told is nothing that can be done for these. With that being said I do believe that along with the seizures have the nausea vomiting which upon further questioning doesn't  seem to be the normal for her and makes me concerned for the possibility of infection or something else going on. I discussed this with the patient and her mother during the office visit today. I do not feel the wound is effective but maybe something else. The responses this was "this just happens to her at times and we don't know why". They did not seem to be interested in going to the hospital to have this checked out further. 10/09/18 on evaluation today patient presents for follow-up concerning her ongoing toe ulcer. She has been tolerating the dressing changes without complication. Fortunately there does not appear to be any evidence of infection which is great news however I do think that the patient would benefit from going ahead for with the total contact cast. She's actually in [name] wheelchair today she tells me that she will use her walker if we initiate the cast. I was very specific about the fact that if we were gonna do the cast I wanted to make sure that she was using the walker in order to prevent any falls. She tells me she does not have stairs that she has to traverse on [name] regular basis at her home. She has not had any seizures since last week again that something that happens to her often she tells me she did talk to [employer] and they said that it may take up to three weeks to get the brace approved for her. Hopefully that will not take that long but nonetheless in the meantime I do think the cast could be of benefit. 10/12/18 on evaluation today patient appears to be doing rather well in regard to her toe ulcer. It's just been [name] few days and already this is significantly improved both as far as overall appearance and size. Fortunately there's no sign of infection. She is here for her first obligatory cast change. 10/19/18 Seen today for follow up and management of left great toe ulcer. Wound continues to show improvement. Noted small open area with seroussang drainage with palpation.  Denies any increased pain or recent fevers during visit. She will continue calcium alginate with offloading shoe. Denies any questions or concerns during visit. 10/26/18 on evaluation today patient appears to be doing about the same as when I last saw her in regard to her wound bed. Fortunately there does not appear to be any signs of infection. Unfortunately she continues to have [name] breakdown in regard to the toe region any time that she is not in the cast. It takes almost no time at all for this to happen. Nonetheless she still has not heard anything from the brace being made by [employer] as  to when exactly this will be available to her. Fortunately there is no signs of infection at this time. 10/30/18 on evaluation today patient presents for application of the total contact cast as we just received him this morning. Fortunately we are gonna be able to apply this to her today which is great news. She continues to have no significant pain which is good news. Overall I do feel like things have been improving while she was the cast is when she doesn't have [name] cast that things get worse. She still has not really heard anything from [employer] regarding her brace. 11/02/18 upon evaluation today patient's wound already appears to be doing significantly better which is good news. Fortunately there does not appear to be any signs of infection also good news. Overall I do think the total contact cast as before is helping to heal this area unfortunately it's just not gonna likely keep the area closed and healed without her getting her brace at least. Again the foot drop is [name] significant issue for her. 11/09/18 on evaluation today patient appears to be doing excellent in regard to her toe ulcer which in fact is completely healed. Fortunately we finally got the situation squared away with the paperwork which was needed to proceed with getting her brace approved by Medicaid. I have filled that out unfortunately  that information has been sent to the orthopedic office that I worked at 2 1/2 years ago and not tired Current wound care measures. Fortunately she seems to be doing very well at this time. 11/23/18 on evaluation today patient appears to be doing More Poorly Compared to Last Time I Saw Her. At [facility] She Had Completely Healed. Currently she is continuing to have issues with reopening. She states that she just found out that the brace was approved through Medicaid now she just has to go get measured in order to have this fitted for her and then made. Subsequently she does not have an appointment for this yet that is going to complicate things we obviously cannot put her back in the cast if we do not have everything measured because they're not gonna be able to measure her foot while she is in the cast. 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 Obviously this is unfortunate and does have me somewhat worried as well. 11/30/18 on evaluation today patient's toe ulcer actually appears to be doing fairly well. The good news is she will be getting her brace in the shoes next week on Wednesday. Hopefully we will be able to get this to heal without having to go back in the cast however she may need the cast in order to get the wound completely heal and then go from there. Fortunately there's no signs of infection at this time. 12/07/18 on evaluation today patient fortunately did receive her brace and she states she could tell this definitely makes her walk better. With that being said she's been having issues with her toe where she noticed yesterday there was [name] lot of tissue that was loosing off this appears to be much larger than what it was previous. She also states that her leg has been read putting much across the top of her foot just about the ankle although this seems to be receiving somewhat. The total  area is still red and appears to be someone infected as best I can  tell. She is previously taken Bactrim and that may be [name] good option for her today as well. We are gonna see what I wound culture shows as well and I think that this is definitely appropriate. With that being said outside of the culture I still need to initiate something in the interim and that's what I'm gonna go ahead and select Bactrim is [name] good option for her. 12/14/18 on evaluation today patient appears to be doing better in regard to her left great toe ulcer as compared to last week's evaluation. There's still some erythema although this is significantly improved which is excellent news. Overall I do believe that she is making good progress is still gonna take some time before she is where I would like her to be from the standpoint of being able to place her back into the total contact cast. Hopefully we will be where we need to be by next week. ZANARI, EISENSTEIN [name] ([id number]) [id number] Page 14 of 21 12/21/18 on evaluation today patient actually appears to be doing poorly in regard to her toe ulcer. She's been tolerating the dressing changes without complication. Fortunately there's no signs of systemic infection although she does have [name] lot of drainage from the toe ulcer and this does seem to be causing some issues at this point. She does have erythema on the distal portion of her toe that appears to be likely cellulitis. 12/28/18 on evaluation today patient actually appears to be doing [name] little better in my pinion in regard to her toe ulcer. With that being said she still does have some evidence of infection at this time and for her culture she had both E. coli as well as enterococcus as organisms noted on evaluation. For that reason I think that though the Keflex likely has treated the E. coli rather well this has really done nothing for the enterococcus. We are going to have to initiate treatment for this  specifically. 01/04/19 on evaluation today patient's toe actually appears to be doing better from the standpoint of infection. She currently would like to see about putting the cash back on I think that this is appropriate as long as she takes care of it and keeps it from getting wet. She is gonna have some drainage we can definitely pass this up with Drawtex and alginate to try to prevent as much drainage as possible from causing the problems. With that being said I do want to at least try her with the cast between now and Tuesday. If there any issues we can't continue to use it then I will discontinue the use of the cast at that point. 01/08/19 on evaluation today patient actually appears to be doing very well as far as her foot ulcer specifically the great toe on the left is concerned. She did have an area of rubbing on the medial aspect of her left ankle which again is from the cast. Fortunately there's no signs of infection at this point in this appears to be [name] very slight skin breakdown. The patient tells me she felt it rubbing but didn't think it was that bad. Fortunately there is no signs of active infection at this time which is good news. No fevers, chills, nausea, or vomiting noted at this time. 01/15/19 on evaluation today patient actually appears to be doing well in regard to her toe ulcer. Again as previous she seems to do well and she has the cast on which indicates to me that during the time she  doesn't have [name] cast on she's putting way too much pressure on this region. Obviously I think that's gonna be an issue as with the current national emergency concerning the Covid-19 Virus it has been recommended that we discontinue the use of total contact casting by the chief medical officer of our company, Dr. Maurine Minister. The reasoning is that if [name] patient becomes sick and cannot come into have the cast removed they could not just leave this on for an additional two weeks. Obviously the hospitals also  do not want to receive patient's who are sick into the emergency department to potentially contaminate the region and spread the Covid-19 Virus among other sick individuals within the hospital system. Therefore at this point we are suspending the use of total contact cast until the current emergency subsides. This was all discussed with the patient today as well. 01/22/19 on evaluation today patient's wound on her left great toe appears to be doing slightly worse than previously noted last week. She tells me that she has been on this quite [name] bit in fact she tells me she's been awake for 38 straight hours. This is due to the fact that she's having to care for grandparents because nobody else will. She has been taking care of them for five the last seven days since I've seen her they both have dementia his is from [name] stroke and her grandmother's was progressive. Nonetheless she states even her mom who knows her condition and situation has only help two of those days to take care of them she's been taking care of the rest. Fortunately there does not appear to be any signs of active infection in regard to her toe at this point although obviously it doesn't look as good as it did previous. I think this is directly related to her not taking off the pressure and friction by way of taking things easy. Though I completely understand what's going on. 01/29/19 on evaluation today patient's tools are actually appears to be showing some signs of improvement today compared to last week's evaluation as far as not necessarily the overall size of the wound but the fact that she has some new skin growth in between the two ends of the wound opening. Overall I feel like she has done well she states that she had [name] family member give her what sounds to be [name] CAM walker boot which has been helpful as well. 02/05/19 on evaluation today patient's wound bed actually appears to be doing significantly better in regard to her overall  appearance of the size of the wound. With that being said she is still having an issue with offloading efficiently enough to get this to close. Apparently there is some signs of infection at this point as well unfortunately. Previously she's done well of Augmentin I really do not see anything that needs to be culture currently but there theme and cellulitis of the foot that I'm seeing I'm gonna go ahead and place her on an antibiotic today to try to help clear this up. 02/12/2019 on evaluation today patient actually appears to be doing poorly in regard to her overall wound status. She tells me she has been using her offloading shoe but actually comes in today wearing her tennis shoe with the AFO brace. Again as I previously discussed with her this is really not sufficient to allow the area to heal appropriately. Nonetheless she continues to be somewhat noncompliant and I do wonder based on what she has told my  nurse in the past as to whether or not [name] good portion of this noncompliance may be recreational drug and alcohol related. She has had [name] history of heroin overdose and this was fairly recently in the past couple of months that have been seeing her. Nonetheless overall I feel like her wound looks significantly worse today compared to what it was previous. She still has significant erythema despite the Augmentin I am not sure that this is an appropriate medication for her infection I am also concerned that the infection may have gone down into her bone. 02/19/19 on evaluation today patient actually appears to be doing about the same in regard to her toe ulcer. Unfortunately she continues to show signs of bone exposure and infection at this point. There does not appear to be any evidence of worsening of the infection but I'm also not really sure that it's getting significantly better. She is on the Augmentin which should be sufficient for the Staphylococcus aureus infection that she has at this point.  With that being said she may need IV antibiotics to more appropriately treat this. We did have [name] discussion today about hyperbaric option therapy. 02/28/19 on evaluation today patient actually appears to be doing much worse in regard to the wound on her left great toe as compared to even my previous evaluation last week. Unfortunately this seems to be training in [name] pretty poor direction. Her toe was actually now starting to angle laterally and I can actually see the entire joint area of the proximal portion of the digit where is the distal portion of the digit again is no longer even in contact with the joint line. Unfortunately there's [name] lot more necrotic tissue around the edge and the toe appears to be showing signs of becoming gangrenous in my pinion. I'm very concerned about were things stand at this point. She did see infectious disease and they are planning to send in [name] prescription for Sivextro for her and apparently this has been approved. With that being said I don't think she should avoid taking this but at the same time I'm not sure that it's gonna be sufficient to save her toe at this point. She tells me that she still having to care for grandparents which I think is putting quite [name] bit of strain on her foot and specifically the total area and has caused this to break down even to [name] greater degree than would've otherwise been expected. 03/05/19 on evaluation today patient actually appears to be doing quite well in regard to her toe all things considering. She still has bone exposed but there appears to be much less your thing on overall the appearance of the wound and the toe itself is dramatically improved. She still does have some issues currently obviously with infection she did see vascular as well and there concerned that her blood flow to the toad. For that reason they are setting up for an angiogram next week. 03/14/19 on evaluation today patient appears to be doing very poor in regard  to her toe and specifically in regard to the ulceration and the fact that she's starting to notice the toe was leaning even more towards the lateral aspect and the complete joint is visible on the proximal aspect of the joint. Nonetheless she's also noted [name] significant odor and the tip of the toe is turning more dark and necrotic appearing. Overall I think she is getting worse not better as far as this is concerned. For that  reason I am recommending at this point that she likely needs to be seen for likely amputation. READMISSION 03/19/2021 This is [name] patient that we cared for in this clinic for [name] prolonged period of time in 2019 and 2020 with [name] left foot and left first toe wound. 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 Excell Seltzer. In December 2021 she had an ulcer on her right great toe as well as the fourth and fifth toes. 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 She also had an angiogram at that time and underwent angioplasty of the right anterior tibial artery. In any case she claims that the wound on the right foot is closed I did not look at this today which was probably an oversight although I think that should be done next week. 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 According to Dr. Bernette Redbird last review that she was out of the area being cared for by another physician but recently came back to his attention. 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 [name] culture of this area showed E. coli apparently before she came back to see Dr. Excell Seltzer she was supposed to be receiving antibiotics but she did not really take them. Nor is she offloading this area at all. Finally her last hemoglobin A1c listed in epic was in March 2022 at 14.1 she says things are [name] lot better since then although I am not  sure. 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 She had this wound at the time. During this admission she had E. coli sepsis [name] TEE was negative. SRISTI, OLLILA [name] ([id number]) [id number] Page 15 of 21 Past medical history is extensive and includes type 2 diabetes with peripheral neuropathy cardiomyopathy with an ejection fraction of 33%, hypertension, hyperlipidemia chronic renal failure stage III history of substance abuse with cocaine although she claims to be clean now verified by her mother. She is still [name] heavy cigarette smoker. She has [name] history of bipolar disorder seizure disorder ABI in our clinic was 1.05 6/1; left midfoot in the setting of [name] TMA done previously. Round circular wound with [name] "knuckle" of protruding tissue. The problem is that the knuckle was not attached to any of the surrounding granulation and this probed proximally widely I removed [name] large portion of this tissue. This wound goes with considerable undermining laterally. I do not feel any bone there was no purulence but this is [name] deep wound. 6/8; in spite of the debridement I did last week. She arrives with [name] wound looking exactly the same. [name] protruding "knuckle" of tissue nonadherent to most of the surrounding tissue. There is considerable depth around this from 6-12 o'clock at 2.7 cm and undermining of 1 cm. This does not look overtly infected and the x- ray I did last week was negative for any osseous abnormalities. We have been using silver collagen 6/15; deep tissue culture I did last week showed moderate staph aureus and moderate Pseudomonas. This will definitely require prolonged antibiotic therapy. The pathology on the protuberant area was negative for malignancy fungus etc. 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 We have been using silver  collagen. I am going to be prescribing  Levaquin for 2 weeks. Her CT scan of the foot is down for 7/5 6/22; CT scan of the foot on 7 5. She says she has hardware in the left leg from her previous fracture. She is on the Levaquin for the deep tissue culture I did that showed methicillin sensitive staph aureus and Pseudomonas. I gave her [name] 2-week supply and she will have another week. She arrives in clinic today with the same protuberant tissue however this is nonadherent to the tissue surrounding it. I am really at [name] loss to explain this unless there is underlying deep tissue infection 6/29; patient presents for 1 week follow-up. She has been using collagen to the wound bed. She reports taking her antibiotics as prescribed.She has no complaints or issues today. She denies signs of infection. 7/6; patient presents for one week followup. She has been using collagen to the wound bed. She states she is taking Levaquin however at times she is not able to keep it down. She denies signs of infection. 7/13; patient presents for 1 week follow-up. She has been using silver alginate to the wound bed. She still has nausea when taking her antibiotics. She denies signs of infection. 7/20; patient presents for 1 week follow-up. She has been using silver alginate with gentamicin cream to the wound bed. She denies any issues and has no complaints today. She denies signs of infection. 7/27; patient presents for 1 week follow-up. She continues to use silver alginate with gentamicin cream to the wound bed. She reports starting her antibiotics. She has no issues or complaints. Overall she reports stability to the wound. 8/3; patient presents for 1 week follow-up. She has been using silver alginate with gentamicin cream to the wound bed. She reports completing all antibiotics. She has no issues or complaints today. She denies signs of infection. 8/17; patient presents for 2-week follow-up. He is to use silver alginate to  the wound bed. She has no issues or complaints today. She denies signs of infection. She reports her pain has improved in her foot since last clinic visit 8/24; patient presents for 1 week follow-up. She continues to use silver alginate to the wound bed. She has no issues or complaints. She denies signs of infection. Pain is stable. 9/7; patient presents for follow-up. She missed her last week appointment due to feeling ill. She continues to use silver alginate. She has [name] new wound to the right lower extremity that is covered in eschar. She states It occurred over the past week and has no idea how it started. She currently denies signs of infection. 9/14; patient presents for follow-up. T the left foot wound she has been using gentamicin cream and silver alginate. T the right lower extremity wound she has o o been keeping this covered and has not obtain Santyl. 9/21; patient presents for follow-up. She reports using gentamicin cream and silver alginate to the left foot and Santyl to the right lower extremity wound. She has no issues or complaints today. She denies signs of infection. 9/28; patient presents for follow-up. She reports [name] new wound to her right heel. She states this occurred [name] few days ago and is progressively gotten worse. She has been trying to clean the area with [name] Q-tip and Santyl. She reports stability in the other 2 wounds. She has been using gentamicin cream and silver alginate to the left foot and Santyl to the right lower extremity wound. 10/12; patient presents for follow-up. She reports improvement to the wound beds.  She is seeing vein and vascular to discuss the potential of [name] left BKA. She states they are going to do an arteriogram. She continues to use silver alginate with dressing changes to her wounds. 11/2; patient presents for follow-up. She states she has not been doing dressing changes to the wound beds. She states she is not able to offload the areas. She reports  chronic pain to her left foot wound. 11/9; patient presents for follow-up. She came in with only socks on. She states she forgot to put on shoes. It is unclear if she is doing any dressing changes. She currently denies systemic signs of infection. 11/16; patient presents for follow-up. She came again only with socks on. She states she does not wear shoes ever. It is unclear if she does dressing changes. She currently denies systemic signs of infection. 11/23; patient presents for follow-up. She wore her shoes today. It still unclear exactly what dressing she is using for each wound but she did states she obtained Dakin's solution and has been using this to the left foot wound. She currently denies signs of infection. 11/30; patient presents for follow-up. She has no issues or complaints today. She currently denies signs of infection. 12/7; patient presents for follow-up. She has no issues or complaints today. She has been using Hydrofera Blue to the right heel wound and Dakin solution to the left foot wound. Her right anterior leg wound is healed. She currently denies signs of infection. 12/14; patient presents for follow-up. She has been using Hydrofera Blue to the right heel and Dakin's to the left foot wounds. She has no issues or complaints today. She denies signs of infection. 12/21; patient presents for follow-up. She reports using Hydrofera Blue to the right heel and Dakin's to the left foot wound. She denies signs of infection. 12/28; patient presents for follow-up. She continues to use Dakin's to the left foot wound and Hydrofera Blue to the right heel wound. She denies signs of infection. 1/4; patient presents for follow-up. She has no issues or complaints today. She denies signs of infection. 1/11; patient presents for follow-up. It is unclear if she has been dressing these wounds over the past week. She currently denies signs of infection. 1/18; patient presents for follow-up. She states  she has been using Dakin's wet-to-dry dressings to the left foot. She has been using Hydrofera Blue to the right foot foot wound. She states that the anterior right leg wound has reopened and draining serous fluid. She denies signs of infection. 1/25; patient presents for follow-up. She has no issues or complaints today. 2/1; patient presents for follow-up. She has no issues or complaints today. She denies signs of infection. 2/8; patient presents for follow-up. She has lost her surgical shoes. She did not have [name] dressing to the right heel wound. She currently denies signs of infection. 2/15; patient presents for follow-up. She reports more pain to the right heel today. She denies purulent drainage Or fever/chills 2/22; patient presents for follow-up. She reports taking clindamycin over the past week. She states that she continues to have pain to her right heel. She [name], [name] ([id number]) [id number] Page 16 of 21 reports purulent drainage. Readmission 03/16/2022 Ms. Rikiya Dupriest is [name] 47 year old female with [name] past medical history of type 2 diabetes, osteomyelitis to her feet, chronic systolic heart failure and bipolar disorder that presents to the clinic for bilateral feet wounds and right lower extremity wound. She was last seen in our clinic on 12/15/2021.  At that time she had purulent drainage coming out of her right plantar foot and I recommended she go to the ED. She states she went to [facility] and has been there for the past 3 months. I cannot see the records. She states she had OR debridement and was on several weeks of IV antibiotics while inpatient. Since discharge she has not been taking care of the wound beds. She had nothing on her feet other than socks today. She currently denies signs of infection. 5/31; patient presents for follow-up. She has been using Dakin's wet-to-dry dressings to the wound beds on her feet bilaterally and antibiotic  ointment to the right anterior leg wound. She had [name] wound culture done at last clinic visit that showed moderate Pseudomonas aeruginosa sensitive to ciprofloxacin. She currently denies systemic signs of infection. 6/14; patient presents for follow-up. She received Keystone 5 days ago and has been using this on the wound beds. She states that last week she had to go to the hospital because she had increased warmth and erythema to the right foot. She was started on 2 oral antibiotics. She states she has been taking these. She currently denies systemic signs of infection. She has no issues or complaints today. 6/21; patient presents for follow-up. She states she has been using Keystone antibiotics to the wound beds. She has no issues or complaints today. She denies signs of infection. 6/28; patient presents for follow-up. She has been using Keystone antibiotics to the wound beds. She has no issues or complaints today. 7/12; patient presents for follow-up. Has been using Keystone antibiotics to the wound beds with calcium alginate. She has no issues or complaints today. She never followed up with her orthopedic surgeon who did the OR debridement to the right foot. We discussed the total contact cast for the left foot and patient would like to do this next week. 7/19; patient presents for follow-up. She has been using Keystone antibiotics with calcium alginate to the wound beds. She has no issues or complaints today. Patient is in agreement to do the total contact cast of the left foot today. She knows to return later this week for the obligatory cast change. 05-13-2022 upon evaluation today patient's wound which she has the cast of the left leg actually appears to be doing significantly better. Fortunately I do not see any signs of active infection locally or systemically which is great news and overall I am extremely pleased with where we stand currently. 7/26; patient presents for follow-up. She has [name]  cast in place for the past week. She states it irritated her shin. Other than that she tolerated the cast well. She states she would like [name] break for 1 week from the cast. We have been using Keystone antibiotic and Aquacel to both wound beds. She denies signs of infection. 8/2; patient presents for follow-up. She has been using Keystone and Aquacel to the wound beds. She denies any issues and has no complaints. She is agreeable to have the cast placed today for the left leg. 06-03-2022 upon evaluation today patient appears to be doing well with regard to her wound she saw some signs of improvement which is great news. Fortunately I do not see any evidence of active infection locally or systemically at this time which is great news. No fevers, chills, nausea, vomiting, or diarrhea. 8/16; patient presents for follow-up. She has no issues or complaints today. We have been using Keystone and Aquacel to the wound beds. The  left lower extremity is in [name] total contact cast. She is tolerated this well. 8/23; patient presents for follow-up. She has had the total contact cast on the left leg for the past week. Unfortunately this has rubbed and broken down the skin to the medial foot. She currently denies signs of infection. She has been using Keystone antibiotic to the right plantar foot wound. 8/30; patient presents for follow-up. We have held off on the total contact cast for the left leg for the past week. Her wound on the left foot has improved and the previous surrounding breakdown of skin has epithelialized. She has been using Keystone antibiotic to both wound beds. She has no issues or complaints today. She denies signs of infection. 9/6; patient presents for follow-up. She has ordered her's Keystone antibiotic and this is arriving this week. She has been doing Dakin's wet-to-dry dressings to the wound beds. She denies signs of infection. She is agreeable to the total contact cast today. 9/13; patient  presents for follow-up. She states that the cast caused her left leg shin to be sore. She would like to take [name] break from the cast this week. She has been using Keystone antibiotic to the right plantar foot wound. She denies signs of infection. 9/20; patient presents for follow-up. She has been using Keystone antibiotics to the wound beds with calcium alginate to the right foot wound and Hydrofera Blue to the left foot wound. She is agreeable to having the cast placed today. She has been approved for Apligraf and we will order this for next clinic visit. 9/27; patient presents for follow-up. We have been using Keystone antibiotic with Hydrofera Blue to the left foot wound under [name] total contact cast. T the right o foot wound she has been using Keystone antibiotic and calcium alginate. She declines [name] total contact cast today. Apligraf is available for placement and she would like to proceed with this. 07-28-2022 upon evaluation today patient appears to be doing well currently in regard to her wound. She is actually showing signs of significant improvement which is great news. Fortunately I do not see any evidence of active infection locally nor systemically at this time. She has been seeing Dr. Mikey Bussing and to be honest has been doing very well with the cast. Subsequently she comes in today with [name] cast on and we did reapply that today as well. She did not really want to she try to talk me out of that but I explained that if she wanted to heal this is really the right way to go. Patient voiced understanding. In regard to her right foot this is actually [name] lot better compared to the last time I saw her which is also great news. 10/11; patient presents for follow-up. Apligraf and the total contact cast was placed to the left leg at last clinic visit. She states that her right foot wound had burning pain to it with the placement of Apligraf to this area. She has been doing [place] over this area. She denies  signs of infection including increased warmth, erythema or purulent drainage. 11/1; 3-week follow-up. The patient fortunately did not have [name] total contact cast or an Apligraf and on the left foot. She has been using Keystone ABD pads and kerlix and her own running shoes She arrives in clinic today with thick callus and [name] very poor surface on the left foot on the right nonviable skin subcutaneous tissue and [name] deep probing hole. 11/15; patient missed her last clinic  appointment. She states she has not been dressing the wound beds for the past 2 weeks. She states that at she had [name] new roommate but is now going back to live with her mother. Apparently its been [name] distracting 2 weeks. Patient currently denies signs of infection. 11/22; patient presents for follow-up. She states she has been using Keystone antibiotic and Dakin's wet-to-dry dressings to the wound beds. She is agreeable for cast placement today. We had ordered Apligraf however this has not been received by our facility. 11/29; Patient had [name] total contact cast placed at last clinic visit and she tolerated this well. We were using silver alginate under the cast. Patient's been using Keystone antibiotic with Aquacel to the right plantar foot wound. She has no issues or complaints today. Apligraf is available for placement today. Patient would like to proceed with this. 12/6; patient presents for follow-up. She had Apligraf placed in standard fashion last clinic visit under the total contact cast to the left lower extremity. She has ZYAN, HOEY [name] ([id number]) [id number] Page 17 of 21 been using Keystone antibiotic and Aquacel to the right plantar foot wound. She has no issues or complaints today. 12/13; patient presents for follow-up. She has finished 5 Apligraf placements. Was told she would not qualify for more. We have been doing [name] total contact cast to the left lower extremity. She has been using Keystone  antibiotic and Aquacel to the right plantar foot wound. She has no issues or complaints today. 12/20; patient presents for follow-up. We have been using Hydrofera Blue with Keystone antibiotic under [name] total contact cast of the left lower extremity. She reports using Keystone antibiotic and silver alginate to the right heel wound. She has no issues or complaints today. 12/27; patient presents with [name] healthy wound on the left midfoot. We have Apligraf to apply that to that more also using [name] total contact cast. On the right we are using Keystone and silver alginate. She is offloading the right heel with [name] surgical shoealthough by her admission she is on her feet quite [name] bit 1/3; patient presents for follow-up. Apligraf was placed to the wound beds last clinic visit. She was placed in [name] total contact cast to the left lower extremity. She declines [name] total contact cast today. She states that her mother is in the hospital and she cannot adequately get around with the cast on. 1/10; patient presents for follow-up. She declined the total contact cast at last clinic visit. Both wounds have declined in appearance. She states that she has been on her feet and not offloading the wound beds. She currently denies signs of infection. 1/17; patient presents for follow-up. She had the total contact cast along with Apligraf placed last week to the left lower extremity. She tolerated this well. She has been using Aquacel Ag and Keystone antibiotic to the right heel wound. She currently denies signs of infection. She has no issues or complaints today. 1/24; patient presents for follow-up. We have been using Apligraf to the left foot wound along with [name] total contact cast. She has done well with this. T the right o heel wound she has been using Aquacel Ag and Keystone antibiotic ointment. She has no issues or complaints today. She denies signs of infection. 1/31; patient presents for follow-up. We have been using Apligraf to  the left foot wound along with the total contact cast. She continues to do well with this. To the right heel we have been using Aquacel Ag  and Keystone antibiotic ointment. She has no issues or complaints today. 12-01-2022 upon evaluation patient is seen today on my schedule due to the fact that she unfortunately was in the hospital yesterday. Her cast needed to come off the only reason she is out of the hospital is due to the fact that they would not take it off at the hospital which is somewhat bewildered reading to me to be perfectly honest. I am not certain why this was but either way she was released and then was placed on my schedule today in order to get this off and reapply the total contact casting as appropriate. I do not have an Apligraf for her today it was applied last week and today's actually expired yesterday as there was some scheduling conflicts with her being in the hospital. Nonetheless we do not have that for reapplication today but the good news that she is not draining too much and the Apligraf can go for up to 2 weeks so I am going to go ahead and reapply the total contact casting but we are going to leave the Apligraf in place. 2/14; patient presents for follow-up. T the left leg she has had the total contact cast and Apligraf for the past week. She has had no issues with this. T the o o right heel she has been using antibiotic ointment and Aquacel Ag. 2/21; patient presents for follow-up. We have been using Apligraf and [name] total contact cast to the left lower extremity. She is tolerated this well. Unfortunately she is not approved for any more Apligraf per insurance. She has been using antibiotic ointment and Aquacel Ag to the right foot. She has no issues or complaints today. 2/28; patient presents for follow-up. We have been using Hydrofera Blue and antibiotic ointment under the total contact cast to the left lower extremity. He has been using Aquacel Ag with antibiotic  ointment to the right plantar foot. She has no issues or complaints today. 3/6; patient presents for follow-up. She did not obtain her gentamicin ointment. She has been using Aquacel Ag to the right plantar foot wound. We have been using Hydrofera Blue with antibiotic ointment under the total contact cast to the left lower extremity. She has no issues or complaints today. 3/12; patient presents for follow-up. She has been using gentamicin ointment and Aquacel Ag to the right plantar foot wound. We have been using Hydrofera Blue with antibiotic ointment under the total contact cast on the left lower extremity foot wound. She has no issues or complaints today. 3/20; patient presents for follow-up. She has been using gentamicin ointment and Aquacel Ag to the right plantar foot wound. We have been using Hydrofera Blue with antibiotic ointment under the total contact cast to the left lower extremity wound. She has no issues or complaints today. 3/27; patient presents for follow-up. We have been using antibiotic ointment and Aquacel Ag to the right plantar foot wound. We have been using Hydrofera Blue with antibiotic ointment under the total contact cast to the left lower extremity. Both wounds are smaller. 01-24-2023 upon evaluation today patient actually appears to be doing better compared to last time I saw her. It has been several weeks since I last saw her but nonetheless I think the total contact casting is doing [name] good job. Fortunately I do not see any evidence of infection or worsening overall which is great news and in general I do believe that she is moving in the appropriate direction. It has  been [name] very long road but nonetheless I feel like she is nearing the end at least in regard to the left foot and even the right foot looks much better to be perfectly honest. 01-31-2023 upon evaluation today patient appears to be doing well currently in regard to her wounds. She has been require some sharp  debridement which I think is probably bone both sides to remove [name] lot of callus that has built up at this point. I discussed that with her today. Also think total contact cast is doing well for the left foot [name] continue that as well. 4/17; patient presents for follow-up. We have been doing Hydrofera Blue with antibiotic ointment under the total contact cast to the left lower extremity and Aquacel Ag with antibiotic ointment to the right foot wound. She has been using her offloading heel shoe here. 4/24; patient presents for follow-up. We have been doing Hydrofera Blue with antibiotic ointment under the total contact cast to the left lower extremity and Aquacel Ag with antibiotic ointment to the right foot wound. She has been using her offloading heel shoe here. Wounds are smaller with slough accumulation. 5/1; patient presents for follow-up. We have been doing Hydrofera Blue with antibiotic ointment under the total contact cast to the left lower extremity and Aquacel Ag with antibiotic ointment to the right foot wound. Left foot wound is stable and right foot wound appears smaller. 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 On the left she is using Hydrofera Blue topical antibiotic under [name] total contact cast, on the right plantar heel gentamicin Aquacel Ag. According to our intake nurse both are making slow but steady improvements. 5/15; patient presents for follow-up. We have been doing Hydrofera Blue with antibiotic ointment under the total contact cast on the left and antibiotic ointment with Aquacel Ag on the right. Unfortunately the left foot wound has declined in size and appearance. We also do not have the total contact cast available in office today. Patient denies signs of infection. 5/22; patient presents for follow-up. We have been doing Hydrofera Blue and antibiotic ointment to the left foot and Aquacel Ag with antibiotic ointment to the right foot. We took [name] break from the cast since  we did not have it placed at last week. Wounds look stable if not slightly improved. 03-27-2023 patient presents today she has had this For Most 2 Weeks Because She Was in the Hospital and They Did Not T It off. Again That Is Really Not ake Something That She Could Control but Nonetheless We Are Seeing Her at This Point Today for Reevaluation. We Do Want to Go and See about Reapply the Cast. Again We Had All Set Everything up to Go Ahead and Do This When We Went to Get the Cast Only to Realize That Not Had Come in and We Were out at the Moment. With That Being [place] We Will Get Have T Get the Exam before We Can Reapply the T Contact Casting Going Forward. o otal 6/12; patient presents for follow-up. She has been using Hydrofera Blue to the left foot wound and Aquacel Ag to the right foot wound. No cast was available last week placed on the patient so she has been offloading with her surgical shoe. She has no issues or complaints today. 6/19; patient presents for follow-up. We have been using Hydrofera Blue to the left foot wound under the total contact cast and Aquacel Ag to the right wound. [name], [name] ([id number]) [id number] Page  18 of 21 She has no issues or complaints today. The wounds are smaller. 6/26; patient presents for follow-up. We have been using Hydrofera Blue to the left foot wound under the total contact cast and Aquacel Ag to the right foot wound. Wounds are smaller. Patient has no issues or complaints today. 7/3; patient presents for follow-up. We have been using Hydrofera Blue to the left foot wound under the total contact cast and Aquacel Ag to the right foot wound. Wounds Appear smaller. No signs of infection. 7/10; patient presents for follow-up. We have been using Hydrofera Blue to the left foot wound under the total contact cast and Aquacel Ag to the right foot wound. Left foot wound is smaller. Right foot wound is stable. 7/17; patient presents  for follow-up. We have been using Hydrofera Blue to the left foot under the total contact cast and Aquacel Ag to the right foot wound. She has no issues or complaints today. 7/24; patient presents for follow-up. We have been using Hydrofera Blue to the left foot under the total contact cast and Aquacel Ag to the right foot wound. Unfortunately she was bit by her dog over the weekend and developed [name] wound to the right lower leg. She has been keeping the area covered. 7/31; patient presents for follow-up. We have been using Hydrofera Blue to the left foot under the total contact cast and Aquacel Ag to the right foot wound and mupirocin ointment to the right anterior leg wound. She started her oral antibiotics 2 days ago. 8/7; patient presents for follow-up. We have been using Hydrofera Blue and antibiotic ointment to the left foot under the total contact cast and Aquacel Ag to the right foot wound. Antibiotic ointment to the right anterior leg wound. She completed her oral antibiotics. She has no issues or complaints today. Wounds continue to improve albeit slowly. 8/14; 8/14; patient has 3 wounds 1 on the left plantar heel 1 on the right plantar heel and [name] small wound on the right anterior lower leg. We have been using Hydrofera Blue in the left heel Aquacel on the right heel. The left heel has been in [name] total contact cast 8/21; patient presents for follow-up. We have been using Hydrofera Blue to the left foot wound under the total contact cast. T the right heel patient has been o using Aquacel Ag. T the anterior leg wound antibiotic ointment. All wounds have healed. o 8/28; patient presents for follow-up. She has been using her surgical shoe with offloading felt pads. Her wounds have remained closed. 9/11; patient presents for final follow-up to assure that her wounds have remained closed. Unfortunately the left foot wound has opened again. But she has been wearing her Surgical shoe for offloading  with felt pad. 07-14-2023 upon evaluation today patient appears to be doing well currently in regard to her wound. She has been tolerating the dressing changes without complication with total contact casting. I am going to be helping take care of her at this point as she is transferring from Dr. Mikey Bussing to me since Dr. Mikey Bussing is on maternity leave. 07-24-2023 upon evaluation today patient appears to be doing decently well currently in regard to her wound. She is actually showing signs of improvement in regard to the left foot and I feel like this is much better than last week no debridement even necessary today. We may have to perform some slight debridement come next week we will see how things go. Monitor for any signs of  worsening overall and obviously if anything changes she should be letting us know. 08-01-2023 upon evaluation today patient appears to be doing excellent in regard to her feet. The right foot/heel area is completely healed she does have some callus buildup I am going to trim this down [name] little bit I do not want to build up to the point of cracking she is doing well with the urea cream. With regard to the left heel she is doing well with casting I am going to perform some debridement here to clear away before reapplying the cast and I think she is really doing quite well though. 08-07-2023 upon evaluation patient's wound actually showed signs of excellent improvement and actually very pleased with where we stand I do believe that she is making good headway towards complete closure which is great news. 08-14-2023 upon evaluation today patient appears to be doing well currently in regard to her wound. She actually is going require [name] little bit of debridement but fortunately nothing too significant today actually feel like she is doing much better. This is mainly to remove callus on the little slough and biofilm down to good subcutaneous tissue. In general I think that she is making  excellent headway here. 08-21-2023 upon evaluation today patient appears to be doing well currently in regard to her wound which is actually showing signs of significant improvement. I am extremely pleased with where things stand I do not see any signs of infection at this time which is great news and overall I do think that we are making headway towards complete closure which is also excellent news. 08-30-2023 upon evaluation today patient's wound is showing signs of continued improvement. Fortunately I do not see any evidence of worsening overall and I believe that the patient is making excellent headway towards closure. I am extremely pleased with where we stand. 09-04-2023 upon evaluation today patient appears to be doing well currently in regard to her foot ulcer. She has been tolerating the dressing changes without complication. Fortunately I do not see any signs of active infection locally or systemically at this time which is great news. No fevers, chills, nausea, vomiting, or diarrhea. 09-11-2023 upon evaluation today patient appears to be doing well currently in regard to her wound on the left foot. I feel like she is actually showing signs of improvement I feel like the collagen is doing [name] good job. With that being said unfortunately she has an area where there is [name] spot on the right foot that has not really open but at least there looks like there may have been something that popped off of this area. Objective Constitutional Well-nourished and well-hydrated in no acute distress. Vitals Time Taken: 9:09 AM, Height: 69 in, Weight: 178 lbs, BMI: 26.3, Temperature: 98.3 F, Pulse: 71 bpm, Respiratory Rate: 18 breaths/min, Blood Pressure: 125/79 mmHg. Respiratory normal breathing without difficulty. AGRIPINA, MORGANS [name] ([id number]) [id number] Page 86 of 21 Psychiatric this patient is able to make decisions and demonstrates good insight into disease process. Alert  and Oriented x 3. pleasant and cooperative. General Notes: Upon inspection patient's wound did not require sharp debridement today which I discussed with the patient. I think that she does have an issue however with [name] small area that is not open but it looks to be [name] circular region on the right heel which would not have to keep an eye on. Integumentary (Hair, Skin) Wound #12R status is Open. Original cause of wound was Pressure Injury. The date acquired  was: 03/16/2020. The wound has been in treatment 77 weeks. The wound is located on the Left,Medial,Plantar Foot. The wound measures 0.3cm length x 0.2cm width x 0.1cm depth; 0.047cm^2 area and 0.005cm^3 volume. There is Fat Layer (Subcutaneous Tissue) exposed. There is [name] medium amount of serosanguineous drainage noted. The wound margin is flat and intact. There is no granulation within the wound bed. There is no necrotic tissue within the wound bed. 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 There was [name] T Contact otal Cast Procedure by [name], PA-C. Post procedure Diagnosis Wound #12R: Same as Pre-Procedure Plan Follow-up Appointments: Return Appointment in 1 week. Bathing/ Shower/ Hygiene: May shower; gently cleanse wound with antibacterial soap, rinse and pat dry prior to dressing wounds No tub bath. Off-Loading: T Contact Cast to Left Lower Extremity otal Open toe surgical shoe - right foot WOUND #12R: - Foot Wound Laterality: Plantar, Left, Medial Cleanser: Vashe 5.8 (oz) 1 x Per Week/30  Days Discharge Instructions: Use vashe 5.8 (oz) as directed Prim Dressing: Promogran Matrix 4.34 (in) 1 x Per Week/30 Days ary Discharge Instructions: Moisten w/normal saline or sterile water and apply into wound bed Prim Dressing: Silvercel Small 2x2 (in/in) 1 x Per Week/30 Days ary Discharge Instructions: Apply Silvercel Small 2x2 (in/in) over wound area Secondary Dressing: Gauze 1 x Per Week/30 Days Discharge Instructions: As directed: dry, moistened with saline or moistened with Dakins Solution Secured With: [employer] Sterile or Non-Sterile 6-ply 4.5x4 (yd/yd) 1 x Per Week/30 Days Discharge Instructions: Apply Kerlix as directed 1. I would recommend based on what we are seeing that we going to have the patient continue to monitor for any signs of infection or worsening. I do believe that she is doing well with the total contact casting we can continue that on the left foot. 2. I would recommend as well that we continue with the silver alginate dressing over top of the collagen to the main wound this is keeping the area in general around the plantar aspect of the foot under control. 3. And also can recommend the gauze and roll gauze to secure in place. We will see patient back for reevaluation in 1 week here in the clinic. If anything worsens or changes patient will contact our office for additional recommendations. Electronic Signature(s) Signed: 09/11/2023 10:36:45 AM By: [name] PA-C Entered By: [name] on 09/11/2023 07:36:45 [name] ([id number]) [id number] Page 20 of 21 -------------------------------------------------------------------------------- Total Contact Cast Details Patient Name: Date of Service: BURNA, HEUTON 09/11/2023 9:00 [name]: [id number] Patient Account Number: [id number] Date of Birth/Sex: Treating RN: 1976/09/30 (47 y.o. [name] Primary Care Provider: [name] Other Clinician:  [name] Referring Provider: Treating Provider/Extender: [name] in Treatment: 77 T Contact Cast Applied for Wound Assessment: otal Wound #12R Left,Medial,Plantar Foot Performed By: Physician [name], PA-C The following information was scribed by: [name] The information was scribed for: [name] Post Procedure Diagnosis Same as Pre-procedure Electronic Signature(s) Signed: 09/12/2023 4:00:09 PM By: [name] PA-C Signed: 09/12/2023 4:17:00 PM By: [name] Entered By: [name] on  09/11/2023 06:54:54 -------------------------------------------------------------------------------- SuperBill Details Patient Name: Date of Service: LESHON, RUEGER 09/11/2023 Medical Record Number: [id number] Patient Account Number: [id number] Date of Birth/Sex: Treating RN: 31-Aug-1976 (47 y.o. [name] Primary Care Provider: [name] Other Clinician: [name] Referring Provider: Treating Provider/Extender: [name] Weeks in Treatment: 77 Diagnosis Coding ICD-10 Codes Code Description E11.621 Type 2 diabetes mellitus with foot ulcer L97.528 Non-pressure chronic ulcer of other part of left foot with other specified severity E11.42 Type 2 diabetes mellitus with diabetic polyneuropathy Z79.4 Long term (current) use of insulin N18.6 End stage renal disease Z99.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 L84 Corns and callosities Facility Procedures : LYNLIE, LACROIX Code: [id number] RISTI [name] ([id number] I Description: [phone] - APPLY TOTAL CONTACT LEG CAST 2) [phone] CD-10 Diagnosis Description L97.528 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

## 2023-09-18 ENCOUNTER — Encounter: Payer: MEDICAID | Admitting: Physician Assistant

## 2023-09-18 DIAGNOSIS — E11621 Type 2 diabetes mellitus with foot ulcer: Secondary | ICD-10-CM | POA: Diagnosis not present

## 2023-09-18 NOTE — Progress Notes (Signed)
[name], [name] ([id number]) [id number] Page 1 of 22 Visit Report for 09/18/2023 Chief Complaint Document Details Patient Name: Date of Service: [name], [name] 09/18/2023 9:00 [name]: [id number] Patient Account Number: [id number] Date of Birth/Sex: Treating RN: 12-Aug-1976 (47 y.o. [name] Primary Care Provider: Lindwood [name] Other Clinician: [name] Referring Provider: Treating Provider/Extender: [name] Weeks in Treatment: 40 Information Obtained from: Patient Chief Complaint Left foot ulcer Electronic Signature(s) Signed: 09/18/2023 9:23:46 AM By: [name] PA-C Entered By: [name] on 09/18/2023 09:23:46 -------------------------------------------------------------------------------- Debridement Details Patient Name: Date of Service: [name] 09/18/2023 9:00 [name]: [id number] Patient Account Number: [id number] Date of Birth/Sex: Treating RN: 1976-02-25 (47 y.o. [name] Primary Care Provider: Lindwood [name] Other Clinician: [name] Referring Provider: Treating Provider/Extender: [name] in Treatment: 78 Debridement Performed for Assessment: Wound #12R Left,Medial,Plantar Foot Performed By: Physician [name], PA-C The following information was scribed by: [name] The information was scribed for: [name] Debridement Type: Debridement Severity of Tissue Pre Debridement: Fat layer exposed Level of Consciousness (Pre-procedure): Awake and Alert Pre-procedure Verification/Time Out Yes - 09:26 Taken: Start Time: 09:26 Percent of Wound Bed Debrided: 100% T Area Debrided (cm): otal 0.09 Tissue and other material debrided: Viable, Non-Viable, Callus, Slough, Subcutaneous, Slough Level: Skin/Subcutaneous Tissue Debridement Description: Excisional Instrument: Curette Bleeding: Minimum Hemostasis Achieved:  Pressure Brearley, [name] ([id number]) [id number] Page 2 of 22 Response to Treatment: Procedure was tolerated well Level of Consciousness (Post- Awake and Alert procedure): Post Debridement Measurements of Total Wound Length: (cm) 0.3 Width: (cm) 0.4 Depth: (cm) 0.1 Volume: (cm) 0.009 Character of Wound/Ulcer Post Debridement: Stable Severity of Tissue Post Debridement: Fat layer exposed Post Procedure Diagnosis Same as Pre-procedure Electronic Signature(s) Unsigned Entered ByBetha Loa on 09/18/2023 09:32:50 -------------------------------------------------------------------------------- HPI Details Patient Name: Date of Service: [name], [name] 09/18/2023 9:00 [name]: [id number] Patient Account Number: [id number] Date of Birth/Sex: Treating RN: 05/28/1976 (47 y.o. [name] Primary Care Provider: Lindwood [name] Other Clinician: [name] Referring Provider: Treating Provider/Extender: [name] in Treatment: 80 History of Present Illness HPI Description: 01/18/18-She is here for initial evaluation of the left great toe ulcer. She is [name] poor historian in regards to timeframe in detail. She states approximately 4 weeks ago she lacerated her toe on something in the house. She followed up with her primary care who placed her on Bactrim and ultimately [name] second dose of Bactrim prior to coming to wound clinic. She states she has been treating the toe with peroxide, Betadine and [name] Band-Aid. She did not check her blood sugar this morning but checked it yesterday morning it was 327; she is unaware of [name] recent A1c and there are no current records. She saw Dr. she would've orthopedics last week for an old injury to the left ankle, she states he did not see her toe, nor did she bring it to his attention. She smokes approximately 1 pack cigarettes [name] day. Her social situation is concerning, she arrives this  morning with her mother who appears extremely intoxicated/under the influence; her mother was asked to leave the room and be monitored by the patient's grandmother. The patient's aunt then accompanied the patient and the room throughout the rest of the appointment. We had [name] lengthy discussion regarding the deleterious effects of uncontrolled hyperglycemia and smoking as it relates to wound healing and overall health.  She was strongly encouraged to decrease her smoking and get her diabetes under better control. She states she is currently on [name] diet and has cut down her [facility] consumption. The left toe is erythematous, macerated and slightly edematous with malodor present. The edema in her left foot is below her baseline, there is no erythema streaking. We will treat her with Santyl, doxycycline; we have ordered and xray, culture and provided [name] Peg assist surgical shoe and cultured the wound. 01/25/18-She is here in follow-up evaluation for [name] left great toe ulcer and presents with an abscess to her suprapubic area. She states her blood sugars remain elevated, feeling "sick" and if levels are below 250, but she is trying. She has made no attempt to decrease her smoking stating that we "can't take away her food in her cigarettes". She has been compliant with offloading using the PEG assist you. She is using Santyl daily. the culture obtained last week grew staph aureus and Enterococcus faecalis; continues on the doxycycline and Augmentin was added on Monday. The suprapubic area has erythema, no femoral variation, purple discoloration, minimal induration, was accessed with [name] cotton tip applicator with sanguinopurulent drainage, this was cultured, I suspect the current antibiotic treatment will cover and we will not add anything to her current treatment plan. She was advised to go to urgent care or ER with any change in redness, induration or fever. 02/01/18-She is here in follow-up evaluation for left  great toe ulcers and [name] new abdominal abscess from last week. She was able to use packing until earlier this week, where she "forgot it was there". She states she was feeling ill with GI symptoms last week and was not taking her antibiotic. She states her glucose levels have been predominantly less than 200, with occasional levels between 200-250. She thinks this was contributing to her GI symptoms as they have resolved without intervention. There continues to be significant laceration to left toe, otherwise it clinically looks stable/improved. There is now less superficial opening to the lateral aspect of the great toe that was residual blister. We will transition to [facility] to all wounds, she will continue her Augmentin. If there is no change or deterioration next week for reculture. 02/08/18-She is here in follow-up evaluation for left great toe ulcer and abdominal ulcer. There is an improvement in both wounds. She has been wrapping her left toe with coban, not by our direction, which has created an area of discoloration to the medial aspect; she has been advised to NOT use coban secondary to her neuropathy. She states her glucose levels have been high over this last week ranging from 200-350, she continues to smoke. She admits to being less compliant with her offloading shoe. We will continue with same treatment plan and she will follow-up next week. 02/15/18-She is here in follow-up evaluation for left great toe ulcer and abdominal ulcer. The abdominal ulcer is epithelialized. The left great toe ulcer is improved and all injury from last week using the Coban wrap is resolved, the lateral ulcer is healed. She admits to noncompliance with wearing offloading shoe and admits to glucose levels being greater than 300 most of the week. She continues to smoke and expresses no desire to quit. There is one area medially that probes deeper than it has historically, erythema to the toe and dorsal foot has  consistently waxed and waned. There is no overt signs of cellulitis or infection [name], [name] ([id number]) [id number] Page 3 of 22 but we  will culture the wound for any occult infection given the new area of depth and erythema. We will hold off on sensitivities for initiation of antibiotic therapy. 02/22/18-She is here in follow up evaluation for left great toe ulcer. There is overall significant improvement in both wound appearance, erythema and edema with changes made last week. She was not initiated on antibiotic therapy. Culture obtained last week showed oxacillin sensitive staph aureus, sensitive to clindamycin. Clindamycin has been called into the pharmacy but she has been instructed to hold off on initiation secondary to overall clinical improvement and her history of antibiotic intolerance. She has been instructed to contact the clinic with any noted changes/deterioration and the wound, erythema, edema and/or pain. She will follow-up next week. She continues to smoke and her glucose levels remain elevated >250; she admits to compliance with offloading shoe 03/01/18 on evaluation today patient appears to be doing fairly well in regard to her left first toe ulcer. She has been tolerating the dressing changes with the [facility] Dressing without complication and overall this has definitely showed signs of improvement according to records as well is what the patient tells me today. I'm very pleased in that regard. She is having no pain today 03/08/18 She is here for follow up evaluation of [name] left great toe ulcer. She remains non-compliant with glucose control and smoking cessation; glucose levels consistently >200. She states that she got new shoe inserts/peg assist. She admits to compliance with offloading. Since my last evaluation there is significant improvement. We will switch to prisma at this time and she will follow up next week. She is noted to be tachycardic  at this appointment, heart rate 120s; she has [name] history of heart rate 70-130 according to our records. She admits to extreme agitation r/t personal issues; she was advised to monitor her heartrate and contact her physician if it does not return to [name] more normal range (<100). She takes cardizem twice daily. 03/15/18-She is here in follow-up evaluation for left great toe ulcer. She remains noncompliant with glucose control and smoking cessation. She admits to compliance with wearing offloading shoe. The ulcer is improved/stable and we will continue with the same treatment plan and she will follow-up next week 03/22/18-She is here for evaluation for left great toe ulcer. There continues to be significant improvement despite recurrent hyperglycemia (over 500 yesterday) and she continues to smoke. She has been compliant with offloading and we will continue with same treatment plan and she will follow-up next week. 03/29/18-She is here for evaluation for left great toe ulcer. Despite continuing to smoke and uncontrolled diabetes she continues to improve. She is compliant with offloading shoe. We will continue with the same treatment plan and she will follow-up next week 04/05/18- She is here in follow up evaluation for [name] left great toe ulcer; she presents with small pustule to left fifth toe (resembles ant bite). She admits to compliance with wearing offloading shoe; continues to smoke or have uncontrolled blood glucose control. There is more callus than usual with evidence of bleeding; she denies known trauma. 04/12/18-She is here for evaluation of left great toe ulcer. Despite noncompliance with glycemic control and smoking she continues to make improvement. She continues to wear offloading shoe. The pustule, that was identified last week, to the left fifth toe is resolved. She will follow-up in 2 weeks 05/03/18-she is seen in follow-up evaluation for [name] left great toe ulcer. She is compliant with offloading,  otherwise noncompliant with glycemic control and  smoking. She has plateaued and there is minimal improvement noted. We will transition to [facility], replaced the insert to her surgical shoe and she will follow-up in one week 05/10/18- She is here in follow up evaluation for [name] left great toe ulcer. It appears stable despite measurement change. We will continue with same treatment plan and follow up next week. 05/24/18-She is seen in follow-up evaluation for [name] left great toe ulcer. She remains compliant with offloading, has made significant improvement in her diet, decreasing the amount of sugar/soda. She said her recent A1c was 10.9 which is lower than. She did see [name] diabetic nutritionist/educator yesterday. She continues to smoke. We will continue with the same treatment plan and she'll follow-up next week. 05/31/18- She is seen in follow-up evaluation for left great toe ulcer. She continues to remain compliant with offloading, continues to make improvement in her diet, increasing her water and decreasing the amount of sugar/soda. She does continue to smoke with no desire to quit. We will apply Prisma to the depth and Hydrofera Blue over. We have not received insurance authorization for oasis. She will follow up next week. 06/07/18-She is seen in follow-up evaluation for left great toe ulcer. It has stalled according to today's measurements although base appears stable. She says she saw [name] diabetic educator yesterday; her average blood sugars are less than 300 which is an improvement for her. She continues to smoke and states "that's my next step" She continues with water over soda. We will order for xray, culture and reinstate ace wrap compression prior to placing apligraf for next week. She is voicing no complaints or concerns. Her dressing will change to iodoflex over the next week in preparation for apligraf. 06/14/18-She is seen in follow-up evaluation for left great toe ulcer. Plain film x-ray  performed last week was negative for osteomyelitis. Wound culture obtained last week grew strep B and OSSA; she is initiated on keflex and cefdinir today; there is erythema to the toe which could be from ace wrap compression, she has [name] history of wrapping too tight and has has been encouraged to maintain ace wraps that we place today. We will hold off on application of apligraf today, will apply next week after antibiotic therapy has been initiated. She admits today that she has resumed taking [name] shower with her foot/toe submerged in water, she has been reminded to keep foot/toe out of the bath water. She will be seen in follow up next week 06/21/18-she is seen in follow-up evaluation for left great toe ulcer. She is tolerating antibiotic therapy with no GI disturbance. The wound is stable. Apligraf was applied today. She has been decreasing her smoking, only had 4 cigarettes yesterday and 1 today. She continues being more compliant in diabetic diet. She will follow-up next week for evaluation of site, if stable will remove at 2 weeks. 06/28/18- She is here in follow up evalution. Apligraf was placed last week, she states the dressing fell off on Tuesday and she was dressing with hydrofera blue. She is healed and will be discharged from the clinic today. She has been instructed to continue with smoking cessation, continue monitoring glucose levels, offloading for an additional 4 weeks and continue with hydrofera blue for additional two weeks for any possible microscopic opening. Readmission: 08/07/18 on evaluation today patient presents for reevaluation concerning the ulcer of her right great toe. She was previously discharged on 06/28/18 healed. Nonetheless she states that this began to show signs of drainage she subsequently  went to her primary care provider. Subsequently an x-ray was performed on 08/01/18 which was negative. The patient was also placed on antibiotics at that time. Fortunately they should  have been effective for the infection. Nonetheless she's been experiencing some improvement but still has [name] lot of drainage coming from the wound itself. 08/14/18 on evaluation today patient's wound actually does show signs of improvement in regard to the erythema at this point. She has completed the antibiotics. With that being said we did discuss the possibility of placing her in [name] total contact cast as of today although I think that I may want to give this just [name] little bit more time to ensure nothing recurrence as far as her infection is concerned. I do not want to put in the cast and risk infection at that time if things are not completely resolved. With that being said she is gonna require some debridement today. 08/21/18 on evaluation today patient actually appears to be doing okay in regard to her toe ulcer. She's been tolerating the dressing changes without complication. With that being said it does appear that she is ready and in fact I think it's appropriate for [place] to go ahead and initiate the total contact cast today. Nonetheless she will require some sharp debridement to prepare the wound for application. Overall I feel like things have been progressing well but we do need to do something to get this to close more readily. 08/24/18 patient seen today for reevaluation after having had the total contact cast applied on Tuesday. She seems to have done very well the wound appears to be doing great and overall I'm pleased with the progress that she's made. There were no abnormal areas of rubbing from the cast on her lower extremity. 08/30/18 on evaluation today patient actually appears to be completely healed in regard to her plantar toe ulcer. She tells me at this point she's been having [name] lot of issues with the cast. She almost fell [name] couple of times the state shall the step of her dog [name] couple times as well. This is been [name] very frustrating process for her other nonetheless she has completely  healed the wound which is excellent news. Overall there does not appear to be the evidence of infection at this time which is great news. 09/11/18 evaluation today patient presents for follow-up concerning her great toe ulcer on the left which has unfortunately reopened since I last saw her which was only [name] couple of weeks ago. Unfortunately she was not able to get in to get the shoe and potentially the AFO that's gonna be necessary due to her left foot drop. She continues with offloading shoe but this is not enough to prevent her from reopening it appears. When we last had her in the total contact cast she did well from [name] healing standpoint but unfortunately the wound reopened as soon as she came out of the cast within just [name] couple of weeks. 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 This is also part of the reason why she falls on [name] regular basis. I just do not believe that is gonna be safe for [place] to reinitiate the total contact cast as last time we had this on she fell 3 times one week  which is definitely not normal for her. 09/18/18 upon evaluation today the patient actually appears to be doing about the same in regard to her toe ulcer. She did not contact [employer] as I asked her to even though I had given her the prescription. In fact she actually states that she has no idea where the prescription is. She did apparently call [employer] and they told her that all she needed to do was bring the prescription in order to be able to be seen and work on getting the AFO for her left foot. With all that being said she still does not have an appointment and I'm not sure were things stand that regard. I will give her [name] new prescription today in order to contact them to PAMLER, WELLE [name] ([id number])  [id number] Page 4 of 22 get this set up. 09/25/18 on evaluation today patient actually appears to be doing about the same in regard to her toes ulcer. She does have [name] small areas which seems to have [name] lot of callous buildup around the edge of the wound which is going to need sharp debridement today. She still is waiting to be scheduled for evaluation with [employer] for possibility of an AFO. She states there supposed to call her tomorrow to get this set up. Unfortunately it does appear that her foot specifically the toe area is showing signs of erythema. There does not appear to be any systemic infection which is in these good news. 10/02/18 on evaluation today patient actually appears to be doing about the same in regard to her toe ulcer. This really has not done too well although it's not significantly larger it's also not significantly smaller. She has been tolerating the dressing changes without complication. She actually has her appointment with [facility] and [place] tomorrow to hopefully be measured for obtaining and AFO splint. I think this would be helpful preventing this from reoccurring. We had contemplated starting the cast this week although to be honest I am reluctant to do that as she's been having nausea, vomiting, and seizure activity over the past three days. She has [name] history of seizures and have been told is nothing that can be done for these. With that being said I do believe that along with the seizures have the nausea vomiting which upon further questioning doesn't seem to be the normal for her and makes me concerned for the possibility of infection or something else going on. I discussed this with the patient and her mother during the office visit today. I do not feel the wound is effective but maybe something else. The responses this was "this just happens to her at times and we don't know why". They did not seem to be interested in going to the hospital  to have this checked out further. 10/09/18 on evaluation today patient presents for follow-up concerning her ongoing toe ulcer. She has been tolerating the dressing changes without complication. Fortunately there does not appear to be any evidence of infection which is great news however I do think that the patient would benefit from going ahead for with the total contact cast. She's actually in [name] wheelchair today she tells me that she will use her walker if we initiate the cast. I was very specific about the fact that if we were gonna do the cast I wanted to make sure that she was using the walker in order to prevent any falls. She tells me she does not have stairs that she has to traverse  on [name] regular basis at her home. She has not had any seizures since last week again that something that happens to her often she tells me she did talk to [employer] and they said that it may take up to three weeks to get the brace approved for her. Hopefully that will not take that long but nonetheless in the meantime I do think the cast could be of benefit. 10/12/18 on evaluation today patient appears to be doing rather well in regard to her toe ulcer. It's just been [name] few days and already this is significantly improved both as far as overall appearance and size. Fortunately there's no sign of infection. She is here for her first obligatory cast change. 10/19/18 Seen today for follow up and management of left great toe ulcer. Wound continues to show improvement. Noted small open area with seroussang drainage with palpation. Denies any increased pain or recent fevers during visit. She will continue calcium alginate with offloading shoe. Denies any questions or concerns during visit. 10/26/18 on evaluation today patient appears to be doing about the same as when I last saw her in regard to her wound bed. Fortunately there does not appear to be any signs of infection. Unfortunately she continues to have [name] breakdown in  regard to the toe region any time that she is not in the cast. It takes almost no time at all for this to happen. Nonetheless she still has not heard anything from the brace being made by [employer] as to when exactly this will be available to her. Fortunately there is no signs of infection at this time. 10/30/18 on evaluation today patient presents for application of the total contact cast as we just received him this morning. Fortunately we are gonna be able to apply this to her today which is great news. She continues to have no significant pain which is good news. Overall I do feel like things have been improving while she was the cast is when she doesn't have [name] cast that things get worse. She still has not really heard anything from [employer] regarding her brace. 11/02/18 upon evaluation today patient's wound already appears to be doing significantly better which is good news. Fortunately there does not appear to be any signs of infection also good news. Overall I do think the total contact cast as before is helping to heal this area unfortunately it's just not gonna likely keep the area closed and healed without her getting her brace at least. Again the foot drop is [name] significant issue for her. 11/09/18 on evaluation today patient appears to be doing excellent in regard to her toe ulcer which in fact is completely healed. Fortunately we finally got the situation squared away with the paperwork which was needed to proceed with getting her brace approved by Medicaid. I have filled that out unfortunately that information has been sent to the orthopedic office that I worked at 2 1/2 years ago and not tired Current wound care measures. Fortunately she seems to be doing very well at this time. 11/23/18 on evaluation today patient appears to be doing More Poorly Compared to Last Time I Saw Her. At [facility] She Had Completely Healed. Currently she is continuing to have issues with reopening. She states that she  just found out that the brace was approved through Medicaid now she just has to go get measured in order to have this fitted for her and then made. Subsequently she does not have an appointment for this  yet that is going to complicate things we obviously cannot put her back in the cast if we do not have everything measured because they're not gonna be able to measure her foot while she is in the cast. 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 Obviously this is unfortunate and does have me somewhat worried as well. 11/30/18 on evaluation today patient's toe ulcer actually appears to be doing fairly well. The good news is she will be getting her brace in the shoes next week on Wednesday. Hopefully we will be able to get this to heal without having to go back in the cast however she may need the cast in order to get the wound completely heal and then go from there. Fortunately there's no signs of infection at this time. 12/07/18 on evaluation today patient fortunately did receive her brace and she states she could tell this definitely makes her walk better. With that being said she's been having issues with her toe where she noticed yesterday there was [name] lot of tissue that was loosing off this appears to be much larger than what it was previous. She also states that her leg has been read putting much across the top of her foot just about the ankle although this seems to be receiving somewhat. The total area is still red and appears to be someone infected as best I can tell. She is previously taken Bactrim and that may be [name] good option for her today as well. We are gonna see what I wound culture shows as well and I think that this is definitely appropriate. With that being said outside of the culture I still need to initiate something in the interim and that's what I'm gonna go ahead and select Bactrim is [name]  good option for her. 12/14/18 on evaluation today patient appears to be doing better in regard to her left great toe ulcer as compared to last week's evaluation. There's still some erythema although this is significantly improved which is excellent news. Overall I do believe that she is making good progress is still gonna take some time before she is where I would like her to be from the standpoint of being able to place her back into the total contact cast. Hopefully we will be where we need to be by next week. 12/21/18 on evaluation today patient actually appears to be doing poorly in regard to her toe ulcer. She's been tolerating the dressing changes without complication. Fortunately there's no signs of systemic infection although she does have [name] lot of drainage from the toe ulcer and this does seem to be causing some issues at this point. She does have erythema on the distal portion of her toe that appears to be likely cellulitis. 12/28/18 on evaluation today patient actually appears to be doing [name] little better in my pinion in regard to her toe ulcer. With that being said she still does have some evidence of infection at this time and for her culture she had both E. coli as well as enterococcus as organisms noted on evaluation. For that reason I think that though the Keflex likely has treated the E. coli rather well this has really done nothing for the enterococcus. We are going to have to initiate treatment for this specifically. 01/04/19 on evaluation today patient's toe actually appears to be doing better from the standpoint of infection. She  currently would like to see about putting the cash back on I think that this is appropriate as long as she takes care of it and keeps it from getting wet. She is gonna have some drainage we can definitely pass this up with Drawtex and alginate to try to prevent as much drainage as possible from causing the problems. With that being said I do want to at least  try her with the cast between now and Tuesday. If there any issues we can't continue to use it then I will discontinue the use of the cast at that point. 01/08/19 on evaluation today patient actually appears to be doing very well as far as her foot ulcer specifically the great toe on the left is concerned. She did have an area of rubbing on the medial aspect of her left ankle which again is from the cast. Fortunately there's no signs of infection at this point in this appears to be [name] very slight skin breakdown. The patient tells me she felt it rubbing but didn't think it was that bad. Fortunately there is no signs of active infection at this time which is good news. No fevers, chills, nausea, or vomiting noted at this time. [name], [name] ([id number]) [id number] Page 5 of 22 01/15/19 on evaluation today patient actually appears to be doing well in regard to her toe ulcer. Again as previous she seems to do well and she has the cast on which indicates to me that during the time she doesn't have [name] cast on she's putting way too much pressure on this region. Obviously I think that's gonna be an issue as with the current national emergency concerning the Covid-19 Virus it has been recommended that we discontinue the use of total contact casting by the chief medical officer of our company, Dr. Maurine Minister. The reasoning is that if [name] patient becomes sick and cannot come into have the cast removed they could not just leave this on for an additional two weeks. Obviously the hospitals also do not want to receive patient's who are sick into the emergency department to potentially contaminate the region and spread the Covid-19 Virus among other sick individuals within the hospital system. Therefore at this point we are suspending the use of total contact cast until the current emergency subsides. This was all discussed with the patient today as well. 01/22/19 on evaluation today patient's  wound on her left great toe appears to be doing slightly worse than previously noted last week. She tells me that she has been on this quite [name] bit in fact she tells me she's been awake for 38 straight hours. This is due to the fact that she's having to care for grandparents because nobody else will. She has been taking care of them for five the last seven days since I've seen her they both have dementia his is from [name] stroke and her grandmother's was progressive. Nonetheless she states even her mom who knows her condition and situation has only help two of those days to take care of them she's been taking care of the rest. Fortunately there does not appear to be any signs of active infection in regard to her toe at this point although obviously it doesn't look as good as it did previous. I think this is directly related to her not taking off the pressure and friction by way of taking things easy. Though I completely understand what's going on. 01/29/19 on evaluation today patient's tools are actually appears to  be showing some signs of improvement today compared to last week's evaluation as far as not necessarily the overall size of the wound but the fact that she has some new skin growth in between the two ends of the wound opening. Overall I feel like she has done well she states that she had [name] family member give her what sounds to be [name] CAM walker boot which has been helpful as well. 02/05/19 on evaluation today patient's wound bed actually appears to be doing significantly better in regard to her overall appearance of the size of the wound. With that being said she is still having an issue with offloading efficiently enough to get this to close. Apparently there is some signs of infection at this point as well unfortunately. Previously she's done well of Augmentin I really do not see anything that needs to be culture currently but there theme and cellulitis of the foot that I'm seeing I'm gonna go ahead  and place her on an antibiotic today to try to help clear this up. 02/12/2019 on evaluation today patient actually appears to be doing poorly in regard to her overall wound status. She tells me she has been using her offloading shoe but actually comes in today wearing her tennis shoe with the AFO brace. Again as I previously discussed with her this is really not sufficient to allow the area to heal appropriately. Nonetheless she continues to be somewhat noncompliant and I do wonder based on what she has told my nurse in the past as to whether or not [name] good portion of this noncompliance may be recreational drug and alcohol related. She has had [name] history of heroin overdose and this was fairly recently in the past couple of months that have been seeing her. Nonetheless overall I feel like her wound looks significantly worse today compared to what it was previous. She still has significant erythema despite the Augmentin I am not sure that this is an appropriate medication for her infection I am also concerned that the infection may have gone down into her bone. 02/19/19 on evaluation today patient actually appears to be doing about the same in regard to her toe ulcer. Unfortunately she continues to show signs of bone exposure and infection at this point. There does not appear to be any evidence of worsening of the infection but I'm also not really sure that it's getting significantly better. She is on the Augmentin which should be sufficient for the Staphylococcus aureus infection that she has at this point. With that being said she may need IV antibiotics to more appropriately treat this. We did have [name] discussion today about hyperbaric option therapy. 02/28/19 on evaluation today patient actually appears to be doing much worse in regard to the wound on her left great toe as compared to even my previous evaluation last week. Unfortunately this seems to be training in [name] pretty poor direction. Her toe was  actually now starting to angle laterally and I can actually see the entire joint area of the proximal portion of the digit where is the distal portion of the digit again is no longer even in contact with the joint line. Unfortunately there's [name] lot more necrotic tissue around the edge and the toe appears to be showing signs of becoming gangrenous in my pinion. I'm very concerned about were things stand at this point. She did see infectious disease and they are planning to send in [name] prescription for Sivextro for her and apparently this has  been approved. With that being said I don't think she should avoid taking this but at the same time I'm not sure that it's gonna be sufficient to save her toe at this point. She tells me that she still having to care for grandparents which I think is putting quite [name] bit of strain on her foot and specifically the total area and has caused this to break down even to [name] greater degree than would've otherwise been expected. 03/05/19 on evaluation today patient actually appears to be doing quite well in regard to her toe all things considering. She still has bone exposed but there appears to be much less your thing on overall the appearance of the wound and the toe itself is dramatically improved. She still does have some issues currently obviously with infection she did see vascular as well and there concerned that her blood flow to the toad. For that reason they are setting up for an angiogram next week. 03/14/19 on evaluation today patient appears to be doing very poor in regard to her toe and specifically in regard to the ulceration and the fact that she's starting to notice the toe was leaning even more towards the lateral aspect and the complete joint is visible on the proximal aspect of the joint. Nonetheless she's also noted [name] significant odor and the tip of the toe is turning more dark and necrotic appearing. Overall I think she is getting worse not better as far  as this is concerned. For that reason I am recommending at this point that she likely needs to be seen for likely amputation. READMISSION 03/19/2021 This is [name] patient that we cared for in this clinic for [name] prolonged period of time in 2019 and 2020 with [name] left foot and left first toe wound. 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 Excell Seltzer. In December 2021 she had an ulcer on her right great toe as well as the fourth and fifth toes. 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 She also had an angiogram at that time and underwent angioplasty of the right anterior tibial artery. In any case she claims that the wound on the right foot is closed I did not look at this today which was probably an oversight although I think that should be done next week. 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 According to Dr. Bernette Redbird last review that she was out of the area being cared for by another physician but recently came back to his attention. 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 [name] culture of this area showed E. coli apparently before she came back to see Dr. Excell Seltzer she was supposed to be receiving antibiotics but she did not really take them. Nor is she offloading this area at all. Finally her last hemoglobin A1c listed in epic was in March 2022 at 14.1 she says things are [name] lot better since then although I am not sure. 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 She had this wound at the time. During this admission she had E. coli sepsis [name] TEE was negative. 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 She is still [name] heavy cigarette smoker. She has [name] history of bipolar disorder seizure disorder ABI in our clinic was 1.05 6/1; left midfoot in the setting of [name] TMA done previously. Round circular wound with [name] "knuckle" of protruding tissue. The problem is that the knuckle was not attached to any of the surrounding granulation and this probed proximally widely I removed [name] large portion of this tissue. This wound goes with considerable undermining laterally. I do not feel any bone there was no purulence but this is [name] deep wound. 6/8; in spite of the debridement I did last week. She arrives with [name] wound looking exactly the same. [name] protruding "knuckle" of tissue nonadherent to most of the surrounding tissue. There is considerable depth around this from 6-12 o'clock at 2.7 cm and undermining of 1 cm. This does not look overtly infected and the x- ray I did last week was negative for any osseous abnormalities. We have been using silver collagen 6/15; deep tissue culture I did last week showed moderate staph aureus and moderate Pseudomonas. This will definitely require prolonged antibiotic therapy. The pathology on the protuberant area was negative for malignancy fungus etc. 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 We have been using silver collagen. I am going to be prescribing Levaquin for 2 weeks. Her CT scan of the foot is down for 7/5 6/22; CT scan of the foot on 7 5. She says she has hardware in the left leg from her previous fracture. She is on the Levaquin for the deep tissue culture I did that showed methicillin sensitive staph aureus and Pseudomonas. I gave her [name] 2-week supply and she will have another week. She arrives in clinic today with the same protuberant tissue however this  is nonadherent to the tissue surrounding it. I am really at [name] loss to explain this unless there is underlying deep tissue infection [name], [name] ([id number]) [phone].pdf Page 6 of 22 6/29; patient presents for 1 week follow-up. She has been using collagen to the wound bed. She reports taking her antibiotics as prescribed.She has no complaints or issues today. She denies signs of infection. 7/6; patient presents for one week followup. She has been using collagen to the wound bed. She states she is taking Levaquin however at times she is not able to keep it down. She denies signs of infection. 7/13; patient presents for 1 week follow-up. She has been using silver alginate to the wound bed. She still has nausea when taking her antibiotics. She denies signs of infection. 7/20; patient presents for 1 week follow-up. She has been using silver alginate with gentamicin cream to the wound bed. She denies any issues and has no complaints today. She denies signs of infection. 7/27; patient presents for 1 week follow-up. She continues to use silver alginate with gentamicin cream to the wound bed. She reports starting her antibiotics. She has no issues or complaints. Overall she reports stability to the wound. 8/3; patient presents for 1 week follow-up. She has been using silver alginate with gentamicin cream to the wound bed. She reports completing all antibiotics. She has no issues or complaints today. She denies signs of infection. 8/17; patient presents for 2-week follow-up. He is to use silver alginate to the wound bed. She has no issues or complaints today. She denies signs of infection.  She reports her pain has improved in her foot since last clinic visit 8/24; patient presents for 1 week follow-up. She continues to use silver alginate to the wound bed. She has no issues or complaints. She denies signs of infection. Pain is stable. 9/7; patient presents for follow-up. She  missed her last week appointment due to feeling ill. She continues to use silver alginate. She has [name] new wound to the right lower extremity that is covered in eschar. She states It occurred over the past week and has no idea how it started. She currently denies signs of infection. 9/14; patient presents for follow-up. T the left foot wound she has been using gentamicin cream and silver alginate. T the right lower extremity wound she has o o been keeping this covered and has not obtain Santyl. 9/21; patient presents for follow-up. She reports using gentamicin cream and silver alginate to the left foot and Santyl to the right lower extremity wound. She has no issues or complaints today. She denies signs of infection. 9/28; patient presents for follow-up. She reports [name] new wound to her right heel. She states this occurred [name] few days ago and is progressively gotten worse. She has been trying to clean the area with [name] Q-tip and Santyl. She reports stability in the other 2 wounds. She has been using gentamicin cream and silver alginate to the left foot and Santyl to the right lower extremity wound. 10/12; patient presents for follow-up. She reports improvement to the wound beds. She is seeing vein and vascular to discuss the potential of [name] left BKA. She states they are going to do an arteriogram. She continues to use silver alginate with dressing changes to her wounds. 11/2; patient presents for follow-up. She states she has not been doing dressing changes to the wound beds. She states she is not able to offload the areas. She reports chronic pain to her left foot wound. 11/9; patient presents for follow-up. She came in with only socks on. She states she forgot to put on shoes. It is unclear if she is doing any dressing changes. She currently denies systemic signs of infection. 11/16; patient presents for follow-up. She came again only with socks on. She states she does not wear shoes ever. It is unclear  if she does dressing changes. She currently denies systemic signs of infection. 11/23; patient presents for follow-up. She wore her shoes today. It still unclear exactly what dressing she is using for each wound but she did states she obtained Dakin's solution and has been using this to the left foot wound. She currently denies signs of infection. 11/30; patient presents for follow-up. She has no issues or complaints today. She currently denies signs of infection. 12/7; patient presents for follow-up. She has no issues or complaints today. She has been using Hydrofera Blue to the right heel wound and Dakin solution to the left foot wound. Her right anterior leg wound is healed. She currently denies signs of infection. 12/14; patient presents for follow-up. She has been using Hydrofera Blue to the right heel and Dakin's to the left foot wounds. She has no issues or complaints today. She denies signs of infection. 12/21; patient presents for follow-up. She reports using Hydrofera Blue to the right heel and Dakin's to the left foot wound. She denies signs of infection. 12/28; patient presents for follow-up. She continues to use Dakin's to the left foot wound and Hydrofera Blue to the right heel wound. She denies signs of infection. 1/4;  patient presents for follow-up. She has no issues or complaints today. She denies signs of infection. 1/11; patient presents for follow-up. It is unclear if she has been dressing these wounds over the past week. She currently denies signs of infection. 1/18; patient presents for follow-up. She states she has been using Dakin's wet-to-dry dressings to the left foot. She has been using Hydrofera Blue to the right foot foot wound. She states that the anterior right leg wound has reopened and draining serous fluid. She denies signs of infection. 1/25; patient presents for follow-up. She has no issues or complaints today. 2/1; patient presents for follow-up. She has no  issues or complaints today. She denies signs of infection. 2/8; patient presents for follow-up. She has lost her surgical shoes. She did not have [name] dressing to the right heel wound. She currently denies signs of infection. 2/15; patient presents for follow-up. She reports more pain to the right heel today. She denies purulent drainage Or fever/chills 2/22; patient presents for follow-up. She reports taking clindamycin over the past week. She states that she continues to have pain to her right heel. She reports purulent drainage. Readmission 03/16/2022 Ms. Sarely Barrall is [name] 47 year old female with [name] past medical history of type 2 diabetes, osteomyelitis to her feet, chronic systolic heart failure and bipolar disorder that presents to the clinic for bilateral feet wounds and right lower extremity wound. She was last seen in our clinic on 12/15/2021. At that time she had purulent drainage coming out of her right plantar foot and I recommended she go to the ED. She states she went to [facility] and has been there for the past 3 months. I cannot see the records. She states she had OR debridement and was on several weeks of IV antibiotics while inpatient. Since discharge she has not been taking care of the wound beds. She had nothing on her feet other than socks today. She currently denies signs of infection. 5/31; patient presents for follow-up. She has been using Dakin's wet-to-dry dressings to the wound beds on her feet bilaterally and antibiotic ointment to the right anterior leg wound. She had [name] wound culture done at last clinic visit that showed moderate Pseudomonas aeruginosa sensitive to ciprofloxacin. She currently denies systemic signs of infection. 6/14; patient presents for follow-up. She received Keystone 5 days ago and has been using this on the wound beds. She states that last week she had to go to the hospital because she had increased warmth and erythema to the right foot. She  was started on 2 oral antibiotics. She states she has been taking these. She currently denies systemic signs of infection. She has no issues or complaints today. 6/21; patient presents for follow-up. She states she has been using Keystone antibiotics to the wound beds. She has no issues or complaints today. She denies signs of infection. [name], [name] ([id number]) [id number] Page 7 of 22 6/28; patient presents for follow-up. She has been using Keystone antibiotics to the wound beds. She has no issues or complaints today. 7/12; patient presents for follow-up. Has been using Keystone antibiotics to the wound beds with calcium alginate. She has no issues or complaints today. She never followed up with her orthopedic surgeon who did the OR debridement to the right foot. We discussed the total contact cast for the left foot and patient would like to do this next week. 7/19; patient presents for follow-up. She has been using Keystone antibiotics with calcium alginate to the wound  beds. She has no issues or complaints today. Patient is in agreement to do the total contact cast of the left foot today. She knows to return later this week for the obligatory cast change. 05-13-2022 upon evaluation today patient's wound which she has the cast of the left leg actually appears to be doing significantly better. Fortunately I do not see any signs of active infection locally or systemically which is great news and overall I am extremely pleased with where we stand currently. 7/26; patient presents for follow-up. She has [name] cast in place for the past week. She states it irritated her shin. Other than that she tolerated the cast well. She states she would like [name] break for 1 week from the cast. We have been using Keystone antibiotic and Aquacel to both wound beds. She denies signs of infection. 8/2; patient presents for follow-up. She has been using Keystone and Aquacel to the wound beds.  She denies any issues and has no complaints. She is agreeable to have the cast placed today for the left leg. 06-03-2022 upon evaluation today patient appears to be doing well with regard to her wound she saw some signs of improvement which is great news. Fortunately I do not see any evidence of active infection locally or systemically at this time which is great news. No fevers, chills, nausea, vomiting, or diarrhea. 8/16; patient presents for follow-up. She has no issues or complaints today. We have been using Keystone and Aquacel to the wound beds. The left lower extremity is in [name] total contact cast. She is tolerated this well. 8/23; patient presents for follow-up. She has had the total contact cast on the left leg for the past week. Unfortunately this has rubbed and broken down the skin to the medial foot. She currently denies signs of infection. She has been using Keystone antibiotic to the right plantar foot wound. 8/30; patient presents for follow-up. We have held off on the total contact cast for the left leg for the past week. Her wound on the left foot has improved and the previous surrounding breakdown of skin has epithelialized. She has been using Keystone antibiotic to both wound beds. She has no issues or complaints today. She denies signs of infection. 9/6; patient presents for follow-up. She has ordered her's Keystone antibiotic and this is arriving this week. She has been doing Dakin's wet-to-dry dressings to the wound beds. She denies signs of infection. She is agreeable to the total contact cast today. 9/13; patient presents for follow-up. She states that the cast caused her left leg shin to be sore. She would like to take [name] break from the cast this week. She has been using Keystone antibiotic to the right plantar foot wound. She denies signs of infection. 9/20; patient presents for follow-up. She has been using Keystone antibiotics to the wound beds with calcium alginate to the  right foot wound and Hydrofera Blue to the left foot wound. She is agreeable to having the cast placed today. She has been approved for Apligraf and we will order this for next clinic visit. 9/27; patient presents for follow-up. We have been using Keystone antibiotic with Hydrofera Blue to the left foot wound under [name] total contact cast. T the right o foot wound she has been using Keystone antibiotic and calcium alginate. She declines [name] total contact cast today. Apligraf is available for placement and she would like to proceed with this. 07-28-2022 upon evaluation today patient appears to be doing well currently in  regard to her wound. She is actually showing signs of significant improvement which is great news. Fortunately I do not see any evidence of active infection locally nor systemically at this time. She has been seeing Dr. Mikey Bussing and to be honest has been doing very well with the cast. Subsequently she comes in today with [name] cast on and we did reapply that today as well. She did not really want to she try to talk me out of that but I explained that if she wanted to heal this is really the right way to go. Patient voiced understanding. In regard to her right foot this is actually [name] lot better compared to the last time I saw her which is also great news. 10/11; patient presents for follow-up. Apligraf and the total contact cast was placed to the left leg at last clinic visit. She states that her right foot wound had burning pain to it with the placement of Apligraf to this area. She has been doing [place] over this area. She denies signs of infection including increased warmth, erythema or purulent drainage. 11/1; 3-week follow-up. The patient fortunately did not have [name] total contact cast or an Apligraf and on the left foot. She has been using Keystone ABD pads and kerlix and her own running shoes She arrives in clinic today with thick callus and [name] very poor surface on the left foot on the right  nonviable skin subcutaneous tissue and [name] deep probing hole. 11/15; patient missed her last clinic appointment. She states she has not been dressing the wound beds for the past 2 weeks. She states that at she had [name] new roommate but is now going back to live with her mother. Apparently its been [name] distracting 2 weeks. Patient currently denies signs of infection. 11/22; patient presents for follow-up. She states she has been using Keystone antibiotic and Dakin's wet-to-dry dressings to the wound beds. She is agreeable for cast placement today. We had ordered Apligraf however this has not been received by our facility. 11/29; Patient had [name] total contact cast placed at last clinic visit and she tolerated this well. We were using silver alginate under the cast. Patient's been using Keystone antibiotic with Aquacel to the right plantar foot wound. She has no issues or complaints today. Apligraf is available for placement today. Patient would like to proceed with this. 12/6; patient presents for follow-up. She had Apligraf placed in standard fashion last clinic visit under the total contact cast to the left lower extremity. She has been using Keystone antibiotic and Aquacel to the right plantar foot wound. She has no issues or complaints today. 12/13; patient presents for follow-up. She has finished 5 Apligraf placements. Was told she would not qualify for more. We have been doing [name] total contact cast to the left lower extremity. She has been using Keystone antibiotic and Aquacel to the right plantar foot wound. She has no issues or complaints today. 12/20; patient presents for follow-up. We have been using Hydrofera Blue with Keystone antibiotic under [name] total contact cast of the left lower extremity. She reports using Keystone antibiotic and silver alginate to the right heel wound. She has no issues or complaints today. 12/27; patient presents with [name] healthy wound on the left midfoot. We have Apligraf to apply  that to that more also using [name] total contact cast. On the right we are using Keystone and silver alginate. She is offloading the right heel with [name] surgical shoealthough by her admission she is  on her feet quite [name] bit 1/3; patient presents for follow-up. Apligraf was placed to the wound beds last clinic visit. She was placed in [name] total contact cast to the left lower extremity. She declines [name] total contact cast today. She states that her mother is in the hospital and she cannot adequately get around with the cast on. 1/10; patient presents for follow-up. She declined the total contact cast at last clinic visit. Both wounds have declined in appearance. She states that she has been on her feet and not offloading the wound beds. She currently denies signs of infection. 1/17; patient presents for follow-up. She had the total contact cast along with Apligraf placed last week to the left lower extremity. She tolerated this well. She has been using Aquacel Ag and Keystone antibiotic to the right heel wound. She currently denies signs of infection. She has no issues or complaints today. [name], [name] ([id number]) [id number] Page 8 of 22 1/24; patient presents for follow-up. We have been using Apligraf to the left foot wound along with [name] total contact cast. She has done well with this. T the right o heel wound she has been using Aquacel Ag and Keystone antibiotic ointment. She has no issues or complaints today. She denies signs of infection. 1/31; patient presents for follow-up. We have been using Apligraf to the left foot wound along with the total contact cast. She continues to do well with this. To the right heel we have been using Aquacel Ag and Keystone antibiotic ointment. She has no issues or complaints today. 12-01-2022 upon evaluation patient is seen today on my schedule due to the fact that she unfortunately was in the hospital yesterday. Her cast needed to come off the  only reason she is out of the hospital is due to the fact that they would not take it off at the hospital which is somewhat bewildered reading to me to be perfectly honest. I am not certain why this was but either way she was released and then was placed on my schedule today in order to get this off and reapply the total contact casting as appropriate. I do not have an Apligraf for her today it was applied last week and today's actually expired yesterday as there was some scheduling conflicts with her being in the hospital. Nonetheless we do not have that for reapplication today but the good news that she is not draining too much and the Apligraf can go for up to 2 weeks so I am going to go ahead and reapply the total contact casting but we are going to leave the Apligraf in place. 2/14; patient presents for follow-up. T the left leg she has had the total contact cast and Apligraf for the past week. She has had no issues with this. T the o o right heel she has been using antibiotic ointment and Aquacel Ag. 2/21; patient presents for follow-up. We have been using Apligraf and [name] total contact cast to the left lower extremity. She is tolerated this well. Unfortunately she is not approved for any more Apligraf per insurance. She has been using antibiotic ointment and Aquacel Ag to the right foot. She has no issues or complaints today. 2/28; patient presents for follow-up. We have been using Hydrofera Blue and antibiotic ointment under the total contact cast to the left lower extremity. He has been using Aquacel Ag with antibiotic ointment to the right plantar foot. She has no issues or complaints today. 3/6; patient presents for  follow-up. She did not obtain her gentamicin ointment. She has been using Aquacel Ag to the right plantar foot wound. We have been using Hydrofera Blue with antibiotic ointment under the total contact cast to the left lower extremity. She has no issues or complaints today. 3/12;  patient presents for follow-up. She has been using gentamicin ointment and Aquacel Ag to the right plantar foot wound. We have been using Hydrofera Blue with antibiotic ointment under the total contact cast on the left lower extremity foot wound. She has no issues or complaints today. 3/20; patient presents for follow-up. She has been using gentamicin ointment and Aquacel Ag to the right plantar foot wound. We have been using Hydrofera Blue with antibiotic ointment under the total contact cast to the left lower extremity wound. She has no issues or complaints today. 3/27; patient presents for follow-up. We have been using antibiotic ointment and Aquacel Ag to the right plantar foot wound. We have been using Hydrofera Blue with antibiotic ointment under the total contact cast to the left lower extremity. Both wounds are smaller. 01-24-2023 upon evaluation today patient actually appears to be doing better compared to last time I saw her. It has been several weeks since I last saw her but nonetheless I think the total contact casting is doing [name] good job. Fortunately I do not see any evidence of infection or worsening overall which is great news and in general I do believe that she is moving in the appropriate direction. It has been [name] very long road but nonetheless I feel like she is nearing the end at least in regard to the left foot and even the right foot looks much better to be perfectly honest. 01-31-2023 upon evaluation today patient appears to be doing well currently in regard to her wounds. She has been require some sharp debridement which I think is probably bone both sides to remove [name] lot of callus that has built up at this point. I discussed that with her today. Also think total contact cast is doing well for the left foot Emina continue that as well. 4/17; patient presents for follow-up. We have been doing Hydrofera Blue with antibiotic ointment under the total contact cast to the left lower  extremity and Aquacel Ag with antibiotic ointment to the right foot wound. She has been using her offloading heel shoe here. 4/24; patient presents for follow-up. We have been doing Hydrofera Blue with antibiotic ointment under the total contact cast to the left lower extremity and Aquacel Ag with antibiotic ointment to the right foot wound. She has been using her offloading heel shoe here. Wounds are smaller with slough accumulation. 5/1; patient presents for follow-up. We have been doing Hydrofera Blue with antibiotic ointment under the total contact cast to the left lower extremity and Aquacel Ag with antibiotic ointment to the right foot wound. Left foot wound is stable and right foot wound appears smaller. 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 On the left she is using Hydrofera Blue topical antibiotic under [name] total contact cast, on the right plantar heel gentamicin Aquacel Ag. According to our intake nurse both are making slow but steady improvements. 5/15; patient presents for follow-up. We have been doing Hydrofera Blue with antibiotic ointment under the total contact cast on the left and antibiotic ointment with Aquacel Ag on the right. Unfortunately the left foot wound has declined in size and appearance. We also do not have the total contact cast available in office today. Patient denies  signs of infection. 5/22; patient presents for follow-up. We have been doing Hydrofera Blue and antibiotic ointment to the left foot and Aquacel Ag with antibiotic ointment to the right foot. We took [name] break from the cast since we did not have it placed at last week. Wounds look stable if not slightly improved. 03-27-2023 patient presents today she has had this For Most 2 Weeks Because She Was in the Hospital and They Did Not T It off. Again That Is Really Not ake Something That She Could Control but Nonetheless We Are Seeing Her at This Point Today for Reevaluation. We Do Want to Go and See about  Reapply the Cast. Again We Had All Set Everything up to Go Ahead and Do This When We Went to Get the Cast Only to Realize That Not Had Come in and We Were out at the Moment. With That Being [place] We Will Get Have T Get the Exam before We Can Reapply the T Contact Casting Going Forward. o otal 6/12; patient presents for follow-up. She has been using Hydrofera Blue to the left foot wound and Aquacel Ag to the right foot wound. No cast was available last week placed on the patient so she has been offloading with her surgical shoe. She has no issues or complaints today. 6/19; patient presents for follow-up. We have been using Hydrofera Blue to the left foot wound under the total contact cast and Aquacel Ag to the right wound. She has no issues or complaints today. The wounds are smaller. 6/26; patient presents for follow-up. We have been using Hydrofera Blue to the left foot wound under the total contact cast and Aquacel Ag to the right foot wound. Wounds are smaller. Patient has no issues or complaints today. 7/3; patient presents for follow-up. We have been using Hydrofera Blue to the left foot wound under the total contact cast and Aquacel Ag to the right foot wound. Wounds Appear smaller. No signs of infection. 7/10; patient presents for follow-up. We have been using Hydrofera Blue to the left foot wound under the total contact cast and Aquacel Ag to the right foot wound. Left foot wound is smaller. Right foot wound is stable. 7/17; patient presents for follow-up. We have been using Hydrofera Blue to the left foot under the total contact cast and Aquacel Ag to the right foot wound. She has no issues or complaints today. 7/24; patient presents for follow-up. We have been using Hydrofera Blue to the left foot under the total contact cast and Aquacel Ag to the right foot wound. Unfortunately she was bit by her dog over the weekend and developed [name] wound to the right lower leg. She has been keeping the  area covered. 7/31; patient presents for follow-up. We have been using Hydrofera Blue to the left foot under the total contact cast and Aquacel Ag to the right foot wound and mupirocin ointment to the right anterior leg wound. She started her oral antibiotics 2 days ago. [name], [name] ([id number]) [id number] Page 9 of 22 8/7; patient presents for follow-up. We have been using Hydrofera Blue and antibiotic ointment to the left foot under the total contact cast and Aquacel Ag to the right foot wound. Antibiotic ointment to the right anterior leg wound. She completed her oral antibiotics. She has no issues or complaints today. Wounds continue to improve albeit slowly. 8/14; 8/14; patient has 3 wounds 1 on the left plantar heel 1 on the right plantar heel and [name] small wound  on the right anterior lower leg. We have been using Hydrofera Blue in the left heel Aquacel on the right heel. The left heel has been in [name] total contact cast 8/21; patient presents for follow-up. We have been using Hydrofera Blue to the left foot wound under the total contact cast. T the right heel patient has been o using Aquacel Ag. T the anterior leg wound antibiotic ointment. All wounds have healed. o 8/28; patient presents for follow-up. She has been using her surgical shoe with offloading felt pads. Her wounds have remained closed. 9/11; patient presents for final follow-up to assure that her wounds have remained closed. Unfortunately the left foot wound has opened again. But she has been wearing her Surgical shoe for offloading with felt pad. 07-14-2023 upon evaluation today patient appears to be doing well currently in regard to her wound. She has been tolerating the dressing changes without complication with total contact casting. I am going to be helping take care of her at this point as she is transferring from Dr. Mikey Bussing to me since Dr. Mikey Bussing is on maternity leave. 07-24-2023 upon  evaluation today patient appears to be doing decently well currently in regard to her wound. She is actually showing signs of improvement in regard to the left foot and I feel like this is much better than last week no debridement even necessary today. We may have to perform some slight debridement come next week we will see how things go. Monitor for any signs of worsening overall and obviously if anything changes she should be letting us know. 08-01-2023 upon evaluation today patient appears to be doing excellent in regard to her feet. The right foot/heel area is completely healed she does have some callus buildup I am going to trim this down [name] little bit I do not want to build up to the point of cracking she is doing well with the urea cream. With regard to the left heel she is doing well with casting I am going to perform some debridement here to clear away before reapplying the cast and I think she is really doing quite well though. 08-07-2023 upon evaluation patient's wound actually showed signs of excellent improvement and actually very pleased with where we stand I do believe that she is making good headway towards complete closure which is great news. 08-14-2023 upon evaluation today patient appears to be doing well currently in regard to her wound. She actually is going require [name] little bit of debridement but fortunately nothing too significant today actually feel like she is doing much better. This is mainly to remove callus on the little slough and biofilm down to good subcutaneous tissue. In general I think that she is making excellent headway here. 08-21-2023 upon evaluation today patient appears to be doing well currently in regard to her wound which is actually showing signs of significant improvement. I am extremely pleased with where things stand I do not see any signs of infection at this time which is great news and overall I do think that we are making headway towards complete  closure which is also excellent news. 08-30-2023 upon evaluation today patient's wound is showing signs of continued improvement. Fortunately I do not see any evidence of worsening overall and I believe that the patient is making excellent headway towards closure. I am extremely pleased with where we stand. 09-04-2023 upon evaluation today patient appears to be doing well currently in regard to her foot ulcer. She has been tolerating the dressing  changes without complication. Fortunately I do not see any signs of active infection locally or systemically at this time which is great news. No fevers, chills, nausea, vomiting, or diarrhea. 09-11-2023 upon evaluation today patient appears to be doing well currently in regard to her wound on the left foot. I feel like she is actually showing signs of improvement I feel like the collagen is doing [name] good job. With that being said unfortunately she has an area where there is [name] spot on the right foot that has not really open but at least there looks like there may have been something that popped off of this area. 09-18-2023 upon evaluation today patient appears to be doing well currently in regard to her wound although to be honest she still continues to have issues here with the wound not healing as rapidly as I would expect. She does [name] lot of walking on the cast from what I am hearing today and to be honest as I mention to her previously this cast is not exactly appropriate for her based on the size of her leg is technically too small and I think this is causing issues with rubbing and therefore callus buildup that would not otherwise occur with [name] properly fitting cast. I discussed that with her today and I think that potentially having [name] offloading shoe would be better specifically [name] front offloading boot. Electronic Signature(s) Signed: 09/18/2023 9:41:13 AM By: [name] PA-C Entered By: [name] on 09/18/2023  09:41:12 -------------------------------------------------------------------------------- Physical Exam Details Patient Name: Date of Service: [name], [name] 09/18/2023 9:00 [name]: [id number] Patient Account Number: [id number] Date of Birth/Sex: Treating RN: December 20, 1975 (47 y.o. [name] Primary Care Provider: Lindwood [name] Other Clinician: [name] Referring Provider: Treating Provider/Extender: [name] Weeks in Treatment: [address], Tria [name] ([id number]) [id number] Page 10 of 22 Constitutional Well-nourished and well-hydrated in no acute distress. Respiratory normal breathing without difficulty. Psychiatric this patient is able to make decisions and demonstrates good insight into disease process. Alert and Oriented x 3. pleasant and cooperative. Notes Upon inspection patient's wound bed actually showed signs of good granulation and epithelization at this point. Fortunately there does not appear to be any signs of infection I performed debridement to clear away callus patient tolerated this today without complication and postdebridement wound bed appears to be doing much better which is great news. No fevers, chills, nausea, vomiting, or diarrhea. I did go ahead and for the time today reapply the total contact casting as it has at least been keeping things under control. Electronic Signature(s) Signed: 09/18/2023 9:43:36 AM By: [name] PA-C Entered By: [name] on 09/18/2023 09:43:35 -------------------------------------------------------------------------------- Physician Orders Details Patient Name: Date of Service: [name] 09/18/2023 9:00 [name]: [id number] Patient Account Number: [id number] Date of Birth/Sex: Treating RN: 25-Aug-1976 (47 y.o. 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 Bathing/ Shower/ Hygiene May shower; gently cleanse wound with antibacterial soap, rinse and pat dry prior to dressing wounds No tub bath. Off-Loading Total Contact Cast to Left Lower Extremity Open toe surgical shoe - right foot Wound Treatment Wound #12R - Foot Wound Laterality: Plantar, Left, Medial Cleanser: Vashe 5.8 (oz) 1 x Per Week/30 Days Discharge Instructions: Use vashe 5.8 (oz) as directed Prim Dressing: Promogran Matrix 4.34 (in) ary 1 x Per Week/30 Days YAMILEZ, STANTON [name] ([id number]) [phone].pdf Page 11 of 22 Discharge Instructions: Moisten w/normal saline or sterile water and apply into wound bed Prim Dressing: Silvercel Small 2x2 (in/in) 1 x Per Week/30 Days ary Discharge Instructions: Apply Silvercel Small 2x2 (in/in) over wound area Secondary Dressing: Gauze 1 x Per Week/30 Days Discharge Instructions: As directed: dry, moistened with saline or moistened with Dakins Solution Secured With: [employer] Sterile or Non-Sterile 6-ply 4.5x4 (yd/yd) 1 x Per Week/30 Days Discharge Instructions: Apply Kerlix as directed Electronic Signature(s) Unsigned Entered By: [name] on 09/18/2023 09:33:03 -------------------------------------------------------------------------------- Problem List Details Patient Name:  Date of Service: VESTA, DELASHMIT 09/18/2023 9:00 [name]: [id number] Patient Account Number: [id number] Date of Birth/Sex: Treating RN: 09-24-1976 (47 y.o. [name] Primary Care Provider: Lindwood [name] Other Clinician: [name] Referring Provider: Treating Provider/Extender: [name] Weeks in Treatment: 19 Active Problems ICD-10 Encounter Code Description Active Date MDM Diagnosis E11.621 Type 2 diabetes mellitus with foot ulcer 03/16/2022 No Yes L97.528 Non-pressure chronic ulcer of other part of left foot with other specified 03/16/2022 No Yes severity E11.42 Type 2 diabetes mellitus with diabetic polyneuropathy 03/16/2022 No Yes Z79.4 Long term (current) use of insulin 02/22/2023 No Yes N18.6 End stage renal disease 07/14/2023 No Yes Z99.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 07/14/2023 No Yes L84 Corns and callosities 08/01/2023 No Yes Lott, Marabelle [name] ([id number]) [id number] Page 12 of 22 Inactive Problems Resolved Problems ICD-10 Code Description Active Date Resolved Date L97.512 Non-pressure chronic ulcer of other part of right foot with fat layer exposed 03/16/2022 03/16/2022 L97.811 Non-pressure chronic ulcer of other part of right lower leg limited to breakdown of skin 03/16/2022 03/16/2022 W54.0XXA Bitten by dog, initial encounter 05/17/2023 05/17/2023 S81.801A Unspecified open wound, right lower leg, initial encounter 05/17/2023 05/17/2023 M08.676 Type 2 diabetes mellitus with other skin ulcer 05/17/2023 05/17/2023 Electronic Signature(s) Signed: 09/18/2023 9:23:42 AM By: [name] PA-C Entered By: [name] on 09/18/2023 09:23:42 -------------------------------------------------------------------------------- Progress Note Details Patient Name: Date of Service: [name] 09/18/2023 9:00 [name]: [id number] Patient Account Number: [id number] Date of Birth/Sex: Treating  RN: Jan 30, 1976 (47 y.o. [name] Primary Care Provider: Lindwood [name] Other Clinician: [name] Referring Provider: Treating Provider/Extender: [name] in Treatment: 56 Subjective Chief Complaint Information obtained from Patient Left foot ulcer History of Present Illness (HPI) 01/18/18-She is here for initial evaluation of the left great toe ulcer. She is [name] poor historian in regards to timeframe in detail. She states approximately 4 weeks ago she lacerated her toe on something in the house. She followed up with her primary care who placed her on Bactrim and ultimately [name] second dose of Bactrim prior to coming to wound clinic. She states she has been treating the toe with peroxide, Betadine and [name] Band-Aid. She did not check her blood sugar this morning but checked it yesterday morning it was 327; she is unaware of [name] recent A1c and there are no current records. She saw Dr.  she would've orthopedics last week for an old injury to the left ankle, she states he did not see her toe, nor did she bring it to his attention. She smokes approximately 1 pack cigarettes [name] day. Her social situation is concerning, she arrives this morning with her mother who appears extremely intoxicated/under the influence; her mother was asked to leave the room and be monitored by the patient's grandmother. The patient's aunt then accompanied the patient and the room throughout the rest of the appointment. We had [name] lengthy discussion regarding the deleterious effects of uncontrolled hyperglycemia and smoking as it relates to wound healing and overall health. She was strongly encouraged to decrease her smoking and get her diabetes under better control. She states she is currently on [name] diet and has cut down her [facility] consumption. The left toe is erythematous, macerated and slightly edematous with malodor present. The edema in her left foot is below her baseline, there is no erythema  streaking. We will treat her with Santyl, doxycycline; we have ordered and xray, culture and provided [name] Peg assist surgical shoe and cultured the wound. 01/25/18-She is here in follow-up evaluation for [name] left great toe ulcer and presents with an abscess to her suprapubic area. She states her blood sugars remain elevated, feeling "sick" and if levels are below 250, but she is trying. She has made no attempt to decrease her smoking stating that we "can't take away her food in her cigarettes". She has been compliant with offloading using the PEG assist you. She is using Santyl daily. the culture obtained last week grew staph aureus and Enterococcus faecalis; continues on the doxycycline and Augmentin was added on Monday. The suprapubic area has erythema, no femoral variation, purple discoloration, minimal induration, was accessed with [name] cotton tip applicator with sanguinopurulent drainage, this was cultured, I suspect the current antibiotic treatment will cover and we will not add anything to her current treatment plan. She was advised to go to urgent care or ER with any change in redness, induration or fever. 02/01/18-She is here in follow-up evaluation for left great toe ulcers and [name] new abdominal abscess from last week. She was able to use packing until earlier this week, where she "forgot it was there". She states she was feeling ill with GI symptoms last week and was not taking her antibiotic. She states her glucose Baucom, Rudi [name] ([id number]) [phone].pdf Page 13 of 22 levels have been predominantly less than 200, with occasional levels between 200-250. She thinks this was contributing to her GI symptoms as they have resolved without intervention. There continues to be significant laceration to left toe, otherwise it clinically looks stable/improved. There is now less superficial opening to the lateral aspect of the great toe that was residual blister. We will transition to  [facility] to all wounds, she will continue her Augmentin. If there is no change or deterioration next week for reculture. 02/08/18-She is here in follow-up evaluation for left great toe ulcer and abdominal ulcer. There is an improvement in both wounds. She has been wrapping her left toe with coban, not by our direction, which has created an area of discoloration to the medial aspect; she has been advised to NOT use coban secondary to her neuropathy. She states her glucose levels have been high over this last week ranging from 200-350, she continues to smoke. She admits to being less compliant with her offloading shoe. We will continue with same treatment plan and she will follow-up next week.  02/15/18-She is here in follow-up evaluation for left great toe ulcer and abdominal ulcer. The abdominal ulcer is epithelialized. The left great toe ulcer is improved and all injury from last week using the Coban wrap is resolved, the lateral ulcer is healed. She admits to noncompliance with wearing offloading shoe and admits to glucose levels being greater than 300 most of the week. She continues to smoke and expresses no desire to quit. There is one area medially that probes deeper than it has historically, erythema to the toe and dorsal foot has consistently waxed and waned. There is no overt signs of cellulitis or infection but we will culture the wound for any occult infection given the new area of depth and erythema. We will hold off on sensitivities for initiation of antibiotic therapy. 02/22/18-She is here in follow up evaluation for left great toe ulcer. There is overall significant improvement in both wound appearance, erythema and edema with changes made last week. She was not initiated on antibiotic therapy. Culture obtained last week showed oxacillin sensitive staph aureus, sensitive to clindamycin. Clindamycin has been called into the pharmacy but she has been instructed to hold off on initiation  secondary to overall clinical improvement and her history of antibiotic intolerance. She has been instructed to contact the clinic with any noted changes/deterioration and the wound, erythema, edema and/or pain. She will follow-up next week. She continues to smoke and her glucose levels remain elevated >250; she admits to compliance with offloading shoe 03/01/18 on evaluation today patient appears to be doing fairly well in regard to her left first toe ulcer. She has been tolerating the dressing changes with the [facility] Dressing without complication and overall this has definitely showed signs of improvement according to records as well is what the patient tells me today. I'm very pleased in that regard. She is having no pain today 03/08/18 She is here for follow up evaluation of [name] left great toe ulcer. She remains non-compliant with glucose control and smoking cessation; glucose levels consistently >200. She states that she got new shoe inserts/peg assist. She admits to compliance with offloading. Since my last evaluation there is significant improvement. We will switch to prisma at this time and she will follow up next week. She is noted to be tachycardic at this appointment, heart rate 120s; she has [name] history of heart rate 70-130 according to our records. She admits to extreme agitation r/t personal issues; she was advised to monitor her heartrate and contact her physician if it does not return to [name] more normal range (<100). She takes cardizem twice daily. 03/15/18-She is here in follow-up evaluation for left great toe ulcer. She remains noncompliant with glucose control and smoking cessation. She admits to compliance with wearing offloading shoe. The ulcer is improved/stable and we will continue with the same treatment plan and she will follow-up next week 03/22/18-She is here for evaluation for left great toe ulcer. There continues to be significant improvement despite recurrent hyperglycemia  (over 500 yesterday) and she continues to smoke. She has been compliant with offloading and we will continue with same treatment plan and she will follow-up next week. 03/29/18-She is here for evaluation for left great toe ulcer. Despite continuing to smoke and uncontrolled diabetes she continues to improve. She is compliant with offloading shoe. We will continue with the same treatment plan and she will follow-up next week 04/05/18- She is here in follow up evaluation for [name] left great toe ulcer; she presents with small pustule  to left fifth toe (resembles ant bite). She admits to compliance with wearing offloading shoe; continues to smoke or have uncontrolled blood glucose control. There is more callus than usual with evidence of bleeding; she denies known trauma. 04/12/18-She is here for evaluation of left great toe ulcer. Despite noncompliance with glycemic control and smoking she continues to make improvement. She continues to wear offloading shoe. The pustule, that was identified last week, to the left fifth toe is resolved. She will follow-up in 2 weeks 05/03/18-she is seen in follow-up evaluation for [name] left great toe ulcer. She is compliant with offloading, otherwise noncompliant with glycemic control and smoking. She has plateaued and there is minimal improvement noted. We will transition to [facility], replaced the insert to her surgical shoe and she will follow-up in one week 05/10/18- She is here in follow up evaluation for [name] left great toe ulcer. It appears stable despite measurement change. We will continue with same treatment plan and follow up next week. 05/24/18-She is seen in follow-up evaluation for [name] left great toe ulcer. She remains compliant with offloading, has made significant improvement in her diet, decreasing the amount of sugar/soda. She said her recent A1c was 10.9 which is lower than. She did see [name] diabetic nutritionist/educator yesterday. She continues to smoke. We will  continue with the same treatment plan and she'll follow-up next week. 05/31/18- She is seen in follow-up evaluation for left great toe ulcer. She continues to remain compliant with offloading, continues to make improvement in her diet, increasing her water and decreasing the amount of sugar/soda. She does continue to smoke with no desire to quit. We will apply Prisma to the depth and Hydrofera Blue over. We have not received insurance authorization for oasis. She will follow up next week. 06/07/18-She is seen in follow-up evaluation for left great toe ulcer. It has stalled according to today's measurements although base appears stable. She says she saw [name] diabetic educator yesterday; her average blood sugars are less than 300 which is an improvement for her. She continues to smoke and states "that's my next step" She continues with water over soda. We will order for xray, culture and reinstate ace wrap compression prior to placing apligraf for next week. She is voicing no complaints or concerns. Her dressing will change to iodoflex over the next week in preparation for apligraf. 06/14/18-She is seen in follow-up evaluation for left great toe ulcer. Plain film x-ray performed last week was negative for osteomyelitis. Wound culture obtained last week grew strep B and OSSA; she is initiated on keflex and cefdinir today; there is erythema to the toe which could be from ace wrap compression, she has [name] history of wrapping too tight and has has been encouraged to maintain ace wraps that we place today. We will hold off on application of apligraf today, will apply next week after antibiotic therapy has been initiated. She admits today that she has resumed taking [name] shower with her foot/toe submerged in water, she has been reminded to keep foot/toe out of the bath water. She will be seen in follow up next week 06/21/18-she is seen in follow-up evaluation for left great toe ulcer. She is tolerating antibiotic therapy  with no GI disturbance. The wound is stable. Apligraf was applied today. She has been decreasing her smoking, only had 4 cigarettes yesterday and 1 today. She continues being more compliant in diabetic diet. She will follow-up next week for evaluation of site, if stable will remove at 2  weeks. 06/28/18- She is here in follow up evalution. Apligraf was placed last week, she states the dressing fell off on Tuesday and she was dressing with hydrofera blue. She is healed and will be discharged from the clinic today. She has been instructed to continue with smoking cessation, continue monitoring glucose levels, offloading for an additional 4 weeks and continue with hydrofera blue for additional two weeks for any possible microscopic opening. Readmission: 08/07/18 on evaluation today patient presents for reevaluation concerning the ulcer of her right great toe. She was previously discharged on 06/28/18 healed. Nonetheless she states that this began to show signs of drainage she subsequently went to her primary care provider. Subsequently an x-ray was performed on 08/01/18 which was negative. The patient was also placed on antibiotics at that time. Fortunately they should have been effective for the infection. Nonetheless she's been experiencing some improvement but still has [name] lot of drainage coming from the wound itself. 08/14/18 on evaluation today patient's wound actually does show signs of improvement in regard to the erythema at this point. She has completed the antibiotics. With that being said we did discuss the possibility of placing her in [name] total contact cast as of today although I think that I may want to give this just [name] little bit more time to ensure nothing recurrence as far as her infection is concerned. I do not want to put in the cast and risk infection at that time if things are not completely resolved. With that being said she is gonna require some debridement today. 08/21/18 on evaluation  today patient actually appears to be doing okay in regard to her toe ulcer. She's been tolerating the dressing changes without complication. With that being said it does appear that she is ready and in fact I think it's appropriate for [place] to go ahead and initiate the total contact cast today. Nonetheless she will require some sharp debridement to prepare the wound for application. Overall I feel like things have been progressing well but we do need to do something to get this to close more readily. 08/24/18 patient seen today for reevaluation after having had the total contact cast applied on Tuesday. She seems to have done very well the wound appears to be doing great and overall I'm pleased with the progress that she's made. There were no abnormal areas of rubbing from the cast on her lower extremity. 08/30/18 on evaluation today patient actually appears to be completely healed in regard to her plantar toe ulcer. She tells me at this point she's been having [name] lot of issues with the cast. She almost fell [name] couple of times the state shall the step of her dog [name] couple times as well. This is been [name] very frustrating process for her other nonetheless she has completely healed the wound which is excellent news. Overall there does not appear to be the evidence of infection at this time which is great news. [name], [name] ([id number]) [id number] Page 14 of 22 09/11/18 evaluation today patient presents for follow-up concerning her great toe ulcer on the left which has unfortunately reopened since I last saw her which was only [name] couple of weeks ago. Unfortunately she was not able to get in to get the shoe and potentially the AFO that's gonna be necessary due to her left foot drop. She continues with offloading shoe but this is not enough to prevent her from reopening it appears. When we last had her in the total contact cast  she did well from [name] healing standpoint but unfortunately  the wound reopened as soon as she came out of the cast within just [name] couple of weeks. 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 This is also part of the reason why she falls on [name] regular basis. I just do not believe that is gonna be safe for [place] to reinitiate the total contact cast as last time we had this on she fell 3 times one week which is definitely not normal for her. 09/18/18 upon evaluation today the patient actually appears to be doing about the same in regard to her toe ulcer. She did not contact [employer] as I asked her to even though I had given her the prescription. In fact she actually states that she has no idea where the prescription is. She did apparently call [employer] and they told her that all she needed to do was bring the prescription in order to be able to be seen and work on getting the AFO for her left foot. With all that being said she still does not have an appointment and I'm not sure were things stand that regard. I will give her [name] new prescription today in order to contact them to get this set up. 09/25/18 on evaluation today patient actually appears to be doing about the same in regard to her toes ulcer. She does have [name] small areas which seems to have [name] lot of callous buildup around the edge of the wound which is going to need sharp debridement today. She still is waiting to be scheduled for evaluation with [employer] for possibility of an AFO. She states there supposed to call her tomorrow to get this set up. Unfortunately it does appear that her foot specifically the toe area is showing signs of erythema. There does not appear to be any systemic infection which is in these good news. 10/02/18 on evaluation today patient actually appears to be doing about the same in regard to her toe  ulcer. This really has not done too well although it's not significantly larger it's also not significantly smaller. She has been tolerating the dressing changes without complication. She actually has her appointment with [employer] and [place] tomorrow to hopefully be measured for obtaining and AFO splint. I think this would be helpful preventing this from reoccurring. We had contemplated starting the cast this week although to be honest I am reluctant to do that as she's been having nausea, vomiting, and seizure activity over the past three days. She has [name] history of seizures and have been told is nothing that can be done for these. With that being said I do believe that along with the seizures have the nausea vomiting which upon further questioning doesn't seem to be the normal for her and makes me concerned for the possibility of infection or something else going on. I discussed this with the patient and her mother during the office visit today. I do not feel the wound is effective but maybe something else. The responses this was "this just happens to her at times and we don't know why". They did not seem to be interested in going to the hospital to have this checked out further. 10/09/18 on evaluation today patient presents for follow-up concerning her ongoing  toe ulcer. She has been tolerating the dressing changes without complication. Fortunately there does not appear to be any evidence of infection which is great news however I do think that the patient would benefit from going ahead for with the total contact cast. She's actually in [name] wheelchair today she tells me that she will use her walker if we initiate the cast. I was very specific about the fact that if we were gonna do the cast I wanted to make sure that she was using the walker in order to prevent any falls. She tells me she does not have stairs that she has to traverse on [name] regular basis at her home. She has not had any seizures since  last week again that something that happens to her often she tells me she did talk to [employer] and they said that it may take up to three weeks to get the brace approved for her. Hopefully that will not take that long but nonetheless in the meantime I do think the cast could be of benefit. 10/12/18 on evaluation today patient appears to be doing rather well in regard to her toe ulcer. It's just been [name] few days and already this is significantly improved both as far as overall appearance and size. Fortunately there's no sign of infection. She is here for her first obligatory cast change. 10/19/18 Seen today for follow up and management of left great toe ulcer. Wound continues to show improvement. Noted small open area with seroussang drainage with palpation. Denies any increased pain or recent fevers during visit. She will continue calcium alginate with offloading shoe. Denies any questions or concerns during visit. 10/26/18 on evaluation today patient appears to be doing about the same as when I last saw her in regard to her wound bed. Fortunately there does not appear to be any signs of infection. Unfortunately she continues to have [name] breakdown in regard to the toe region any time that she is not in the cast. It takes almost no time at all for this to happen. Nonetheless she still has not heard anything from the brace being made by [employer] as to when exactly this will be available to her. Fortunately there is no signs of infection at this time. 10/30/18 on evaluation today patient presents for application of the total contact cast as we just received him this morning. Fortunately we are gonna be able to apply this to her today which is great news. She continues to have no significant pain which is good news. Overall I do feel like things have been improving while she was the cast is when she doesn't have [name] cast that things get worse. She still has not really heard anything from [employer] regarding her  brace. 11/02/18 upon evaluation today patient's wound already appears to be doing significantly better which is good news. Fortunately there does not appear to be any signs of infection also good news. Overall I do think the total contact cast as before is helping to heal this area unfortunately it's just not gonna likely keep the area closed and healed without her getting her brace at least. Again the foot drop is [name] significant issue for her. 11/09/18 on evaluation today patient appears to be doing excellent in regard to her toe ulcer which in fact is completely healed. Fortunately we finally got the situation squared away with the paperwork which was needed to proceed with getting her brace approved by Medicaid. I have filled that out unfortunately that information  has been sent to the orthopedic office that I worked at 2 1/2 years ago and not tired Current wound care measures. Fortunately she seems to be doing very well at this time. 11/23/18 on evaluation today patient appears to be doing More Poorly Compared to Last Time I Saw Her. At [facility] She Had Completely Healed. Currently she is continuing to have issues with reopening. She states that she just found out that the brace was approved through Medicaid now she just has to go get measured in order to have this fitted for her and then made. Subsequently she does not have an appointment for this yet that is going to complicate things we obviously cannot put her back in the cast if we do not have everything measured because they're not gonna be able to measure her foot while she is in the cast. 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 Obviously this is unfortunate and does have me somewhat worried as well. 11/30/18 on evaluation today patient's toe ulcer actually appears to be doing fairly well. The good news is she will be getting her brace in the  shoes next week on Wednesday. Hopefully we will be able to get this to heal without having to go back in the cast however she may need the cast in order to get the wound completely heal and then go from there. Fortunately there's no signs of infection at this time. 12/07/18 on evaluation today patient fortunately did receive her brace and she states she could tell this definitely makes her walk better. With that being said she's been having issues with her toe where she noticed yesterday there was [name] lot of tissue that was loosing off this appears to be much larger than what it was previous. She also states that her leg has been read putting much across the top of her foot just about the ankle although this seems to be receiving somewhat. The total area is still red and appears to be someone infected as best I can tell. She is previously taken Bactrim and that may be [name] good option for her today as well. We are gonna see what I wound culture shows as well and I think that this is definitely appropriate. With that being said outside of the culture I still need to initiate something in the interim and that's what I'm gonna go ahead and select Bactrim is [name] good option for her. 12/14/18 on evaluation today patient appears to be doing better in regard to her left great toe ulcer as compared to last week's evaluation. There's still some erythema although this is significantly improved which is excellent news. Overall I do believe that she is making good progress is still gonna take some time before she is where I would like her to be from the standpoint of being able to place her back into the total contact cast. Hopefully we will be where we need to be by next week. 12/21/18 on evaluation today patient actually appears to be doing poorly in regard to her toe ulcer. She's been tolerating the dressing changes without complication. Fortunately there's no signs of systemic infection although she does have [name] lot of  drainage from the toe ulcer and this does seem to be causing some issues at this point. She does have erythema on the distal portion of her toe that appears to be likely  cellulitis. 12/28/18 on evaluation today patient actually appears to be doing [name] little better in my pinion in regard to her toe ulcer. With that being said she still does have some evidence of infection at this time and for her culture she had both E. coli as well as enterococcus as organisms noted on evaluation. For that reason I think that though the Keflex likely has treated the E. coli rather well this has really done nothing for the enterococcus. We are going to have to initiate CANDINA, SOTTO [name] ([id number]) [phone].pdf Page 15 of 22 treatment for this specifically. 01/04/19 on evaluation today patient's toe actually appears to be doing better from the standpoint of infection. She currently would like to see about putting the cash back on I think that this is appropriate as long as she takes care of it and keeps it from getting wet. She is gonna have some drainage we can definitely pass this up with Drawtex and alginate to try to prevent as much drainage as possible from causing the problems. With that being said I do want to at least try her with the cast between now and Tuesday. If there any issues we can't continue to use it then I will discontinue the use of the cast at that point. 01/08/19 on evaluation today patient actually appears to be doing very well as far as her foot ulcer specifically the great toe on the left is concerned. She did have an area of rubbing on the medial aspect of her left ankle which again is from the cast. Fortunately there's no signs of infection at this point in this appears to be [name] very slight skin breakdown. The patient tells me she felt it rubbing but didn't think it was that bad. Fortunately there is no signs of active infection at this time which is good news. No fevers,  chills, nausea, or vomiting noted at this time. 01/15/19 on evaluation today patient actually appears to be doing well in regard to her toe ulcer. Again as previous she seems to do well and she has the cast on which indicates to me that during the time she doesn't have [name] cast on she's putting way too much pressure on this region. Obviously I think that's gonna be an issue as with the current national emergency concerning the Covid-19 Virus it has been recommended that we discontinue the use of total contact casting by the chief medical officer of our company, Dr. Maurine Minister. The reasoning is that if [name] patient becomes sick and cannot come into have the cast removed they could not just leave this on for an additional two weeks. Obviously the hospitals also do not want to receive patient's who are sick into the emergency department to potentially contaminate the region and spread the Covid-19 Virus among other sick individuals within the hospital system. Therefore at this point we are suspending the use of total contact cast until the current emergency subsides. This was all discussed with the patient today as well. 01/22/19 on evaluation today patient's wound on her left great toe appears to be doing slightly worse than previously noted last week. She tells me that she has been on this quite [name] bit in fact she tells me she's been awake for 38 straight hours. This is due to the fact that she's having to care for grandparents because nobody else will. She has been taking care of them for five the last seven days since I've seen her they both have dementia his  is from [name] stroke and her grandmother's was progressive. Nonetheless she states even her mom who knows her condition and situation has only help two of those days to take care of them she's been taking care of the rest. Fortunately there does not appear to be any signs of active infection in regard to her toe at this point although obviously it doesn't look as  good as it did previous. I think this is directly related to her not taking off the pressure and friction by way of taking things easy. Though I completely understand what's going on. 01/29/19 on evaluation today patient's tools are actually appears to be showing some signs of improvement today compared to last week's evaluation as far as not necessarily the overall size of the wound but the fact that she has some new skin growth in between the two ends of the wound opening. Overall I feel like she has done well she states that she had [name] family member give her what sounds to be [name] CAM walker boot which has been helpful as well. 02/05/19 on evaluation today patient's wound bed actually appears to be doing significantly better in regard to her overall appearance of the size of the wound. With that being said she is still having an issue with offloading efficiently enough to get this to close. Apparently there is some signs of infection at this point as well unfortunately. Previously she's done well of Augmentin I really do not see anything that needs to be culture currently but there theme and cellulitis of the foot that I'm seeing I'm gonna go ahead and place her on an antibiotic today to try to help clear this up. 02/12/2019 on evaluation today patient actually appears to be doing poorly in regard to her overall wound status. She tells me she has been using her offloading shoe but actually comes in today wearing her tennis shoe with the AFO brace. Again as I previously discussed with her this is really not sufficient to allow the area to heal appropriately. Nonetheless she continues to be somewhat noncompliant and I do wonder based on what she has told my nurse in the past as to whether or not [name] good portion of this noncompliance may be recreational drug and alcohol related. She has had [name] history of heroin overdose and this was fairly recently in the past couple of months that have been seeing her.  Nonetheless overall I feel like her wound looks significantly worse today compared to what it was previous. She still has significant erythema despite the Augmentin I am not sure that this is an appropriate medication for her infection I am also concerned that the infection may have gone down into her bone. 02/19/19 on evaluation today patient actually appears to be doing about the same in regard to her toe ulcer. Unfortunately she continues to show signs of bone exposure and infection at this point. There does not appear to be any evidence of worsening of the infection but I'm also not really sure that it's getting significantly better. She is on the Augmentin which should be sufficient for the Staphylococcus aureus infection that she has at this point. With that being said she may need IV antibiotics to more appropriately treat this. We did have [name] discussion today about hyperbaric option therapy. 02/28/19 on evaluation today patient actually appears to be doing much worse in regard to the wound on her left great toe as compared to even my previous evaluation last week. Unfortunately this  seems to be training in [name] pretty poor direction. Her toe was actually now starting to angle laterally and I can actually see the entire joint area of the proximal portion of the digit where is the distal portion of the digit again is no longer even in contact with the joint line. Unfortunately there's [name] lot more necrotic tissue around the edge and the toe appears to be showing signs of becoming gangrenous in my pinion. I'm very concerned about were things stand at this point. She did see infectious disease and they are planning to send in [name] prescription for Sivextro for her and apparently this has been approved. With that being said I don't think she should avoid taking this but at the same time I'm not sure that it's gonna be sufficient to save her toe at this point. She tells me that she still having to care for  grandparents which I think is putting quite [name] bit of strain on her foot and specifically the total area and has caused this to break down even to [name] greater degree than would've otherwise been expected. 03/05/19 on evaluation today patient actually appears to be doing quite well in regard to her toe all things considering. She still has bone exposed but there appears to be much less your thing on overall the appearance of the wound and the toe itself is dramatically improved. She still does have some issues currently obviously with infection she did see vascular as well and there concerned that her blood flow to the toad. For that reason they are setting up for an angiogram next week. 03/14/19 on evaluation today patient appears to be doing very poor in regard to her toe and specifically in regard to the ulceration and the fact that she's starting to notice the toe was leaning even more towards the lateral aspect and the complete joint is visible on the proximal aspect of the joint. Nonetheless she's also noted [name] significant odor and the tip of the toe is turning more dark and necrotic appearing. Overall I think she is getting worse not better as far as this is concerned. For that reason I am recommending at this point that she likely needs to be seen for likely amputation. READMISSION 03/19/2021 This is [name] patient that we cared for in this clinic for [name] prolonged period of time in 2019 and 2020 with [name] left foot and left first toe wound. 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 Excell Seltzer. In December 2021 she had an ulcer on her right great toe as well as the fourth and fifth toes. 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 She also had an angiogram at that time and underwent angioplasty of the right anterior tibial artery. In any case she claims that the  wound on the right foot is closed I did not look at this today which was probably an oversight although I think that should be done next week. 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 According to Dr. Bernette Redbird last review that she was out of the area being cared for by another physician but recently came back to his attention. 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 [name] culture of this area showed E. coli apparently before she came back to see Dr. Excell Seltzer  she was supposed to be receiving antibiotics but she did not really take them. Nor is she offloading this area at all. Finally her last hemoglobin A1c listed in epic was in March 2022 at 14.1 she says things are [name] lot better since then although I am not sure. 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 She had this wound at the time. During this admission she had E. coli sepsis [name] TEE was negative. 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 She is still [name] heavy cigarette smoker. She has [name] history of bipolar disorder seizure disorder ABI in our clinic was 1.05 6/1; left midfoot in the setting of [name] TMA done previously. Round circular wound with [name] "knuckle" of protruding tissue. The problem is that the knuckle was not Nusbaum, Tasharra [name] ([id number]) [phone].pdf Page 16 of 22 attached to any of the surrounding granulation and this probed proximally widely I removed [name] large portion of this tissue. This wound goes with considerable undermining laterally. I do not feel any bone there was no purulence but this is [name] deep wound. 6/8; in spite  of the debridement I did last week. She arrives with [name] wound looking exactly the same. [name] protruding "knuckle" of tissue nonadherent to most of the surrounding tissue. There is considerable depth around this from 6-12 o'clock at 2.7 cm and undermining of 1 cm. This does not look overtly infected and the x- ray I did last week was negative for any osseous abnormalities. We have been using silver collagen 6/15; deep tissue culture I did last week showed moderate staph aureus and moderate Pseudomonas. This will definitely require prolonged antibiotic therapy. The pathology on the protuberant area was negative for malignancy fungus etc. 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 We have been using silver collagen. I am going to be prescribing Levaquin for 2 weeks[address] scan of the foot is down for 7/5 6/22; CT scan of the foot on 7 5. She says she has hardware in the left leg from her previous fracture. She is on the Levaquin for the deep tissue culture I did that showed methicillin sensitive staph aureus and Pseudomonas. I gave her [name] 2-week supply and she will have another week. She arrives in clinic today with the same protuberant tissue however this is nonadherent to the tissue surrounding it. I am really at [name] loss to explain this unless there is underlying deep tissue infection 6/29; patient presents for 1 week follow-up. She has been using collagen to the wound bed. She reports taking her antibiotics as prescribed.She has no complaints or issues today. She denies signs of infection. 7/6; patient presents for one week followup. She has been using collagen to the wound bed. She states she is taking Levaquin however at times she is not able to keep it down. She denies signs of infection. 7/13; patient presents for 1 week follow-up. She has been using silver alginate to the wound bed. She still has nausea when taking her antibiotics. She  denies signs of infection. 7/20; patient presents for 1 week follow-up. She has been using silver alginate with gentamicin cream to the wound bed. She denies any issues and has no  complaints today. She denies signs of infection. 7/27; patient presents for 1 week follow-up. She continues to use silver alginate with gentamicin cream to the wound bed. She reports starting her antibiotics. She has no issues or complaints. Overall she reports stability to the wound. 8/3; patient presents for 1 week follow-up. She has been using silver alginate with gentamicin cream to the wound bed. She reports completing all antibiotics. She has no issues or complaints today. She denies signs of infection. 8/17; patient presents for 2-week follow-up. He is to use silver alginate to the wound bed. She has no issues or complaints today. She denies signs of infection. She reports her pain has improved in her foot since last clinic visit 8/24; patient presents for 1 week follow-up. She continues to use silver alginate to the wound bed. She has no issues or complaints. She denies signs of infection. Pain is stable. 9/7; patient presents for follow-up. She missed her last week appointment due to feeling ill. She continues to use silver alginate. She has [name] new wound to the right lower extremity that is covered in eschar. She states It occurred over the past week and has no idea how it started. She currently denies signs of infection. 9/14; patient presents for follow-up. T the left foot wound she has been using gentamicin cream and silver alginate. T the right lower extremity wound she has o o been keeping this covered and has not obtain Santyl. 9/21; patient presents for follow-up. She reports using gentamicin cream and silver alginate to the left foot and Santyl to the right lower extremity wound. She has no issues or complaints today. She denies signs of infection. 9/28; patient presents for follow-up. She reports [name] new  wound to her right heel. She states this occurred [name] few days ago and is progressively gotten worse. She has been trying to clean the area with [name] Q-tip and Santyl. She reports stability in the other 2 wounds. She has been using gentamicin cream and silver alginate to the left foot and Santyl to the right lower extremity wound. 10/12; patient presents for follow-up. She reports improvement to the wound beds. She is seeing vein and vascular to discuss the potential of [name] left BKA. She states they are going to do an arteriogram. She continues to use silver alginate with dressing changes to her wounds. 11/2; patient presents for follow-up. She states she has not been doing dressing changes to the wound beds. She states she is not able to offload the areas. She reports chronic pain to her left foot wound. 11/9; patient presents for follow-up. She came in with only socks on. She states she forgot to put on shoes. It is unclear if she is doing any dressing changes. She currently denies systemic signs of infection. 11/16; patient presents for follow-up. She came again only with socks on. She states she does not wear shoes ever. It is unclear if she does dressing changes. She currently denies systemic signs of infection. 11/23; patient presents for follow-up. She wore her shoes today. It still unclear exactly what dressing she is using for each wound but she did states she obtained Dakin's solution and has been using this to the left foot wound. She currently denies signs of infection. 11/30; patient presents for follow-up. She has no issues or complaints today. She currently denies signs of infection. 12/7; patient presents for follow-up. She has no issues or complaints today. She has been using Hydrofera Blue to the right heel wound and  Dakin solution to the left foot wound. Her right anterior leg wound is healed. She currently denies signs of infection. 12/14; patient presents for follow-up. She has been  using Hydrofera Blue to the right heel and Dakin's to the left foot wounds. She has no issues or complaints today. She denies signs of infection. 12/21; patient presents for follow-up. She reports using Hydrofera Blue to the right heel and Dakin's to the left foot wound. She denies signs of infection. 12/28; patient presents for follow-up. She continues to use Dakin's to the left foot wound and Hydrofera Blue to the right heel wound. She denies signs of infection. 1/4; patient presents for follow-up. She has no issues or complaints today. She denies signs of infection. 1/11; patient presents for follow-up. It is unclear if she has been dressing these wounds over the past week. She currently denies signs of infection. 1/18; patient presents for follow-up. She states she has been using Dakin's wet-to-dry dressings to the left foot. She has been using Hydrofera Blue to the right foot foot wound. She states that the anterior right leg wound has reopened and draining serous fluid. She denies signs of infection. 1/25; patient presents for follow-up. She has no issues or complaints today. 2/1; patient presents for follow-up. She has no issues or complaints today. She denies signs of infection. 2/8; patient presents for follow-up. She has lost her surgical shoes. She did not have [name] dressing to the right heel wound. She currently denies signs of infection. 2/15; patient presents for follow-up. She reports more pain to the right heel today. She denies purulent drainage Or fever/chills 2/22; patient presents for follow-up. She reports taking clindamycin over the past week. She states that she continues to have pain to her right heel. She reports purulent drainage. Readmission 03/16/2022 Ms. Iyanni Ballas is [name] 47 year old female with [name] past medical history of type 2 diabetes, osteomyelitis to her feet, chronic systolic heart failure and bipolar disorder that presents to the clinic for bilateral feet wounds and  right lower extremity wound. She was last seen in our clinic on 12/15/2021. At that time she had purulent drainage coming out of her right plantar foot and I recommended she go to the ED. She states she went to [facility] and has been there for the past 3 months. I cannot see the records. She states she had OR debridement and was on several weeks of IV antibiotics while inpatient. Since KIMIRA, COLONNA [name] ([id number]) [id number] Page 17 of 22 discharge she has not been taking care of the wound beds. She had nothing on her feet other than socks today. She currently denies signs of infection. 5/31; patient presents for follow-up. She has been using Dakin's wet-to-dry dressings to the wound beds on her feet bilaterally and antibiotic ointment to the right anterior leg wound. She had [name] wound culture done at last clinic visit that showed moderate Pseudomonas aeruginosa sensitive to ciprofloxacin. She currently denies systemic signs of infection. 6/14; patient presents for follow-up. She received Keystone 5 days ago and has been using this on the wound beds. She states that last week she had to go to the hospital because she had increased warmth and erythema to the right foot. She was started on 2 oral antibiotics. She states she has been taking these. She currently denies systemic signs of infection. She has no issues or complaints today. 6/21; patient presents for follow-up. She states she has been using Keystone antibiotics to the wound beds. She has  no issues or complaints today. She denies signs of infection. 6/28; patient presents for follow-up. She has been using Keystone antibiotics to the wound beds. She has no issues or complaints today. 7/12; patient presents for follow-up. Has been using Keystone antibiotics to the wound beds with calcium alginate. She has no issues or complaints today. She never followed up with her orthopedic surgeon who did the OR  debridement to the right foot. We discussed the total contact cast for the left foot and patient would like to do this next week. 7/19; patient presents for follow-up. She has been using Keystone antibiotics with calcium alginate to the wound beds. She has no issues or complaints today. Patient is in agreement to do the total contact cast of the left foot today. She knows to return later this week for the obligatory cast change. 05-13-2022 upon evaluation today patient's wound which she has the cast of the left leg actually appears to be doing significantly better. Fortunately I do not see any signs of active infection locally or systemically which is great news and overall I am extremely pleased with where we stand currently. 7/26; patient presents for follow-up. She has [name] cast in place for the past week. She states it irritated her shin. Other than that she tolerated the cast well. She states she would like [name] break for 1 week from the cast. We have been using Keystone antibiotic and Aquacel to both wound beds. She denies signs of infection. 8/2; patient presents for follow-up. She has been using Keystone and Aquacel to the wound beds. She denies any issues and has no complaints. She is agreeable to have the cast placed today for the left leg. 06-03-2022 upon evaluation today patient appears to be doing well with regard to her wound she saw some signs of improvement which is great news. Fortunately I do not see any evidence of active infection locally or systemically at this time which is great news. No fevers, chills, nausea, vomiting, or diarrhea. 8/16; patient presents for follow-up. She has no issues or complaints today. We have been using Keystone and Aquacel to the wound beds. The left lower extremity is in [name] total contact cast. She is tolerated this well. 8/23; patient presents for follow-up. She has had the total contact cast on the left leg for the past week. Unfortunately this has rubbed and  broken down the skin to the medial foot. She currently denies signs of infection. She has been using Keystone antibiotic to the right plantar foot wound. 8/30; patient presents for follow-up. We have held off on the total contact cast for the left leg for the past week. Her wound on the left foot has improved and the previous surrounding breakdown of skin has epithelialized. She has been using Keystone antibiotic to both wound beds. She has no issues or complaints today. She denies signs of infection. 9/6; patient presents for follow-up. She has ordered her's Keystone antibiotic and this is arriving this week. She has been doing Dakin's wet-to-dry dressings to the wound beds. She denies signs of infection. She is agreeable to the total contact cast today. 9/13; patient presents for follow-up. She states that the cast caused her left leg shin to be sore. She would like to take [name] break from the cast this week. She has been using Keystone antibiotic to the right plantar foot wound. She denies signs of infection. 9/20; patient presents for follow-up. She has been using Keystone antibiotics to the wound beds with calcium  alginate to the right foot wound and Hydrofera Blue to the left foot wound. She is agreeable to having the cast placed today. She has been approved for Apligraf and we will order this for next clinic visit. 9/27; patient presents for follow-up. We have been using Keystone antibiotic with Hydrofera Blue to the left foot wound under [name] total contact cast. T the right o foot wound she has been using Keystone antibiotic and calcium alginate. She declines [name] total contact cast today. Apligraf is available for placement and she would like to proceed with this. 07-28-2022 upon evaluation today patient appears to be doing well currently in regard to her wound. She is actually showing signs of significant improvement which is great news. Fortunately I do not see any evidence of active infection  locally nor systemically at this time. She has been seeing Dr. Mikey Bussing and to be honest has been doing very well with the cast. Subsequently she comes in today with [name] cast on and we did reapply that today as well. She did not really want to she try to talk me out of that but I explained that if she wanted to heal this is really the right way to go. Patient voiced understanding. In regard to her right foot this is actually [name] lot better compared to the last time I saw her which is also great news. 10/11; patient presents for follow-up. Apligraf and the total contact cast was placed to the left leg at last clinic visit. She states that her right foot wound had burning pain to it with the placement of Apligraf to this area. She has been doing Mullen over this area. She denies signs of infection including increased warmth, erythema or purulent drainage. 11/1; 3-week follow-up. The patient fortunately did not have [name] total contact cast or an Apligraf and on the left foot. She has been using Keystone ABD pads and kerlix and her own running shoes She arrives in clinic today with thick callus and [name] very poor surface on the left foot on the right nonviable skin subcutaneous tissue and [name] deep probing hole. 11/15; patient missed her last clinic appointment. She states she has not been dressing the wound beds for the past 2 weeks. She states that at she had [name] new roommate but is now going back to live with her mother. Apparently its been [name] distracting 2 weeks. Patient currently denies signs of infection. 11/22; patient presents for follow-up. She states she has been using Keystone antibiotic and Dakin's wet-to-dry dressings to the wound beds. She is agreeable for cast placement today. We had ordered Apligraf however this has not been received by our facility. 11/29; Patient had [name] total contact cast placed at last clinic visit and she tolerated this well. We were using silver alginate under the cast. Patient's been  using Keystone antibiotic with Aquacel to the right plantar foot wound. She has no issues or complaints today. Apligraf is available for placement today. Patient would like to proceed with this. 12/6; patient presents for follow-up. She had Apligraf placed in standard fashion last clinic visit under the total contact cast to the left lower extremity. She has been using Keystone antibiotic and Aquacel to the right plantar foot wound. She has no issues or complaints today. 12/13; patient presents for follow-up. She has finished 5 Apligraf placements. Was told she would not qualify for more. We have been doing [name] total contact cast to the left lower extremity. She has been using Keystone antibiotic and  Aquacel to the right plantar foot wound. She has no issues or complaints today. 12/20; patient presents for follow-up. We have been using Hydrofera Blue with Keystone antibiotic under [name] total contact cast of the left lower extremity. She reports using Keystone antibiotic and silver alginate to the right heel wound. She has no issues or complaints today. JASREET, ACE [name] ([id number]) [id number] Page 18 of 22 12/27; patient presents with [name] healthy wound on the left midfoot. We have Apligraf to apply that to that more also using [name] total contact cast. On the right we are using Keystone and silver alginate. She is offloading the right heel with [name] surgical shoealthough by her admission she is on her feet quite [name] bit 1/3; patient presents for follow-up. Apligraf was placed to the wound beds last clinic visit. She was placed in [name] total contact cast to the left lower extremity. She declines [name] total contact cast today. She states that her mother is in the hospital and she cannot adequately get around with the cast on. 1/10; patient presents for follow-up. She declined the total contact cast at last clinic visit. Both wounds have declined in appearance. She states that she has been on her  feet and not offloading the wound beds. She currently denies signs of infection. 1/17; patient presents for follow-up. She had the total contact cast along with Apligraf placed last week to the left lower extremity. She tolerated this well. She has been using Aquacel Ag and Keystone antibiotic to the right heel wound. She currently denies signs of infection. She has no issues or complaints today. 1/24; patient presents for follow-up. We have been using Apligraf to the left foot wound along with [name] total contact cast. She has done well with this. T the right o heel wound she has been using Aquacel Ag and Keystone antibiotic ointment. She has no issues or complaints today. She denies signs of infection. 1/31; patient presents for follow-up. We have been using Apligraf to the left foot wound along with the total contact cast. She continues to do well with this. To the right heel we have been using Aquacel Ag and Keystone antibiotic ointment. She has no issues or complaints today. 12-01-2022 upon evaluation patient is seen today on my schedule due to the fact that she unfortunately was in the hospital yesterday. Her cast needed to come off the only reason she is out of the hospital is due to the fact that they would not take it off at the hospital which is somewhat bewildered reading to me to be perfectly honest. I am not certain why this was but either way she was released and then was placed on my schedule today in order to get this off and reapply the total contact casting as appropriate. I do not have an Apligraf for her today it was applied last week and today's actually expired yesterday as there was some scheduling conflicts with her being in the hospital. Nonetheless we do not have that for reapplication today but the good news that she is not draining too much and the Apligraf can go for up to 2 weeks so I am going to go ahead and reapply the total contact casting but we are going to leave the  Apligraf in place. 2/14; patient presents for follow-up. T the left leg she has had the total contact cast and Apligraf for the past week. She has had no issues with this. T the o o right heel she has been using  antibiotic ointment and Aquacel Ag. 2/21; patient presents for follow-up. We have been using Apligraf and [name] total contact cast to the left lower extremity. She is tolerated this well. Unfortunately she is not approved for any more Apligraf per insurance. She has been using antibiotic ointment and Aquacel Ag to the right foot. She has no issues or complaints today. 2/28; patient presents for follow-up. We have been using Hydrofera Blue and antibiotic ointment under the total contact cast to the left lower extremity. He has been using Aquacel Ag with antibiotic ointment to the right plantar foot. She has no issues or complaints today. 3/6; patient presents for follow-up. She did not obtain her gentamicin ointment. She has been using Aquacel Ag to the right plantar foot wound. We have been using Hydrofera Blue with antibiotic ointment under the total contact cast to the left lower extremity. She has no issues or complaints today. 3/12; patient presents for follow-up. She has been using gentamicin ointment and Aquacel Ag to the right plantar foot wound. We have been using Hydrofera Blue with antibiotic ointment under the total contact cast on the left lower extremity foot wound. She has no issues or complaints today. 3/20; patient presents for follow-up. She has been using gentamicin ointment and Aquacel Ag to the right plantar foot wound. We have been using Hydrofera Blue with antibiotic ointment under the total contact cast to the left lower extremity wound. She has no issues or complaints today. 3/27; patient presents for follow-up. We have been using antibiotic ointment and Aquacel Ag to the right plantar foot wound. We have been using Hydrofera Blue with antibiotic ointment under the total  contact cast to the left lower extremity. Both wounds are smaller. 01-24-2023 upon evaluation today patient actually appears to be doing better compared to last time I saw her. It has been several weeks since I last saw her but nonetheless I think the total contact casting is doing [name] good job. Fortunately I do not see any evidence of infection or worsening overall which is great news and in general I do believe that she is moving in the appropriate direction. It has been [name] very long road but nonetheless I feel like she is nearing the end at least in regard to the left foot and even the right foot looks much better to be perfectly honest. 01-31-2023 upon evaluation today patient appears to be doing well currently in regard to her wounds. She has been require some sharp debridement which I think is probably bone both sides to remove [name] lot of callus that has built up at this point. I discussed that with her today. Also think total contact cast is doing well for the left foot Emina continue that as well. 4/17; patient presents for follow-up. We have been doing Hydrofera Blue with antibiotic ointment under the total contact cast to the left lower extremity and Aquacel Ag with antibiotic ointment to the right foot wound. She has been using her offloading heel shoe here. 4/24; patient presents for follow-up. We have been doing Hydrofera Blue with antibiotic ointment under the total contact cast to the left lower extremity and Aquacel Ag with antibiotic ointment to the right foot wound. She has been using her offloading heel shoe here. Wounds are smaller with slough accumulation. 5/1; patient presents for follow-up. We have been doing Hydrofera Blue with antibiotic ointment under the total contact cast to the left lower extremity and Aquacel Ag with antibiotic ointment to the right foot wound. Left  foot wound is stable and right foot wound appears smaller. 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 On the left  she is using Hydrofera Blue topical antibiotic under [name] total contact cast, on the right plantar heel gentamicin Aquacel Ag. According to our intake nurse both are making slow but steady improvements. 5/15; patient presents for follow-up. We have been doing Hydrofera Blue with antibiotic ointment under the total contact cast on the left and antibiotic ointment with Aquacel Ag on the right. Unfortunately the left foot wound has declined in size and appearance. We also do not have the total contact cast available in office today. Patient denies signs of infection. 5/22; patient presents for follow-up. We have been doing Hydrofera Blue and antibiotic ointment to the left foot and Aquacel Ag with antibiotic ointment to the right foot. We took [name] break from the cast since we did not have it placed at last week. Wounds look stable if not slightly improved. 03-27-2023 patient presents today she has had this For Most 2 Weeks Because She Was in the Hospital and They Did Not T It off. Again That Is Really Not ake Something That She Could Control but Nonetheless We Are Seeing Her at This Point Today for Reevaluation. We Do Want to Go and See about Reapply the Cast. Again We Had All Set Everything up to Go Ahead and Do This When We Went to Get the Cast Only to Realize That Not Had Come in and We Were out at the Moment. With That Being York Spaniel We Will Get Have T Get the Exam before We Can Reapply the T Contact Casting Going Forward. o otal 6/12; patient presents for follow-up. She has been using Hydrofera Blue to the left foot wound and Aquacel Ag to the right foot wound. No cast was available last week placed on the patient so she has been offloading with her surgical shoe. She has no issues or complaints today. 6/19; patient presents for follow-up. We have been using Hydrofera Blue to the left foot wound under the total contact cast and Aquacel Ag to the right wound. She has no issues or complaints today. The wounds  are smaller. 6/26; patient presents for follow-up. We have been using Hydrofera Blue to the left foot wound under the total contact cast and Aquacel Ag to the right foot wound. Wounds are smaller. Patient has no issues or complaints today. 7/3; patient presents for follow-up. We have been using Hydrofera Blue to the left foot wound under the total contact cast and Aquacel Ag to the right foot wound. Wounds Appear smaller. No signs of infection. CORREN, GAINEY [name] ([id number]) [id number] Page 19 of 22 7/10; patient presents for follow-up. We have been using Hydrofera Blue to the left foot wound under the total contact cast and Aquacel Ag to the right foot wound. Left foot wound is smaller. Right foot wound is stable. 7/17; patient presents for follow-up. We have been using Hydrofera Blue to the left foot under the total contact cast and Aquacel Ag to the right foot wound. She has no issues or complaints today. 7/24; patient presents for follow-up. We have been using Hydrofera Blue to the left foot under the total contact cast and Aquacel Ag to the right foot wound. Unfortunately she was bit by her dog over the weekend and developed [name] wound to the right lower leg. She has been keeping the area covered. 7/31; patient presents for follow-up. We have been using Hydrofera Blue to the left foot  under the total contact cast and Aquacel Ag to the right foot wound and mupirocin ointment to the right anterior leg wound. She started her oral antibiotics 2 days ago. 8/7; patient presents for follow-up. We have been using Hydrofera Blue and antibiotic ointment to the left foot under the total contact cast and Aquacel Ag to the right foot wound. Antibiotic ointment to the right anterior leg wound. She completed her oral antibiotics. She has no issues or complaints today. Wounds continue to improve albeit slowly. 8/14; 8/14; patient has 3 wounds 1 on the left plantar heel 1 on the right  plantar heel and [name] small wound on the right anterior lower leg. We have been using Hydrofera Blue in the left heel Aquacel on the right heel. The left heel has been in [name] total contact cast 8/21; patient presents for follow-up. We have been using Hydrofera Blue to the left foot wound under the total contact cast. T the right heel patient has been o using Aquacel Ag. T the anterior leg wound antibiotic ointment. All wounds have healed. o 8/28; patient presents for follow-up. She has been using her surgical shoe with offloading felt pads. Her wounds have remained closed. 9/11; patient presents for final follow-up to assure that her wounds have remained closed. Unfortunately the left foot wound has opened again. But she has been wearing her Surgical shoe for offloading with felt pad. 07-14-2023 upon evaluation today patient appears to be doing well currently in regard to her wound. She has been tolerating the dressing changes without complication with total contact casting. I am going to be helping take care of her at this point as she is transferring from Dr. Mikey Bussing to me since Dr. Mikey Bussing is on maternity leave. 07-24-2023 upon evaluation today patient appears to be doing decently well currently in regard to her wound. She is actually showing signs of improvement in regard to the left foot and I feel like this is much better than last week no debridement even necessary today. We may have to perform some slight debridement come next week we will see how things go. Monitor for any signs of worsening overall and obviously if anything changes she should be letting us know. 08-01-2023 upon evaluation today patient appears to be doing excellent in regard to her feet. The right foot/heel area is completely healed she does have some callus buildup I am going to trim this down [name] little bit I do not want to build up to the point of cracking she is doing well with the urea cream. With regard to the left heel she  is doing well with casting I am going to perform some debridement here to clear away before reapplying the cast and I think she is really doing quite well though. 08-07-2023 upon evaluation patient's wound actually showed signs of excellent improvement and actually very pleased with where we stand I do believe that she is making good headway towards complete closure which is great news. 08-14-2023 upon evaluation today patient appears to be doing well currently in regard to her wound. She actually is going require [name] little bit of debridement but fortunately nothing too significant today actually feel like she is doing much better. This is mainly to remove callus on the little slough and biofilm down to good subcutaneous tissue. In general I think that she is making excellent headway here. 08-21-2023 upon evaluation today patient appears to be doing well currently in regard to her wound which is actually showing signs  of significant improvement. I am extremely pleased with where things stand I do not see any signs of infection at this time which is great news and overall I do think that we are making headway towards complete closure which is also excellent news. 08-30-2023 upon evaluation today patient's wound is showing signs of continued improvement. Fortunately I do not see any evidence of worsening overall and I believe that the patient is making excellent headway towards closure. I am extremely pleased with where we stand. 09-04-2023 upon evaluation today patient appears to be doing well currently in regard to her foot ulcer. She has been tolerating the dressing changes without complication. Fortunately I do not see any signs of active infection locally or systemically at this time which is great news. No fevers, chills, nausea, vomiting, or diarrhea. 09-11-2023 upon evaluation today patient appears to be doing well currently in regard to her wound on the left foot. I feel like she is actually  showing signs of improvement I feel like the collagen is doing [name] good job. With that being said unfortunately she has an area where there is [name] spot on the right foot that has not really open but at least there looks like there may have been something that popped off of this area. 09-18-2023 upon evaluation today patient appears to be doing well currently in regard to her wound although to be honest she still continues to have issues here with the wound not healing as rapidly as I would expect. She does [name] lot of walking on the cast from what I am hearing today and to be honest as I mention to her previously this cast is not exactly appropriate for her based on the size of her leg is technically too small and I think this is causing issues with rubbing and therefore callus buildup that would not otherwise occur with [name] properly fitting cast. I discussed that with her today and I think that potentially having [name] offloading shoe would be better specifically [name] front offloading boot. Objective Constitutional Well-nourished and well-hydrated in no acute distress. Vitals Time Taken: 9:06 AM, Height: 69 in, Weight: 178 lbs, BMI: 26.3, Temperature: 98.8 F, Pulse: 65 bpm, Respiratory Rate: 18 breaths/min, Blood Pressure: 94/56 mmHg. Respiratory normal breathing without difficulty. Psychiatric GERELENE, SEJA [name] ([id number]) [id number] Page 20 of 22 this patient is able to make decisions and demonstrates good insight into disease process. Alert and Oriented x 3. pleasant and cooperative. General Notes: Upon inspection patient's wound bed actually showed signs of good granulation and epithelization at this point. Fortunately there does not appear to be any signs of infection I performed debridement to clear away callus patient tolerated this today without complication and postdebridement wound bed appears to be doing much better which is great news. No fevers, chills, nausea, vomiting,  or diarrhea. I did go ahead and for the time today reapply the total contact casting as it has at least been keeping things under control. Integumentary (Hair, Skin) Wound #12R status is Open. Original cause of wound was Pressure Injury. The date acquired was: 03/16/2020. The wound has been in treatment 78 weeks. The wound is located on the Left,Medial,Plantar Foot. The wound measures 0.2cm length x 0.3cm width x 0.1cm depth; 0.047cm^2 area and 0.005cm^3 volume. There is Fat Layer (Subcutaneous Tissue) exposed. There is [name] medium amount of serosanguineous drainage noted. The wound margin is flat and intact. There is no granulation within the wound bed. There is no necrotic tissue within the wound  bed. 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 There was [name] Excisional Skin/Subcutaneous Tissue Debridement with [name] total area of 0.09 sq cm performed by [name], PA-C. With the following instrument(s): Curette to remove Viable and Non-Viable tissue/material. Material removed includes Callus, Subcutaneous Tissue, and Slough. [name] time out was conducted at 09:26, prior to the start of the procedure. [name] Minimum amount of bleeding was controlled with Pressure. The procedure was tolerated well. Post Debridement Measurements: 0.3cm length x 0.4cm width x 0.1cm depth; 0.009cm^3 volume. Character of Wound/Ulcer Post Debridement is stable. Severity of Tissue Post Debridement is: Fat layer exposed. Post procedure Diagnosis Wound  #12R: Same as Pre-Procedure Pre-procedure diagnosis of Wound #12R is [name] Diabetic Wound/Ulcer of the Lower Extremity located on the Left,Medial,Plantar Foot . There was [name] T Contact otal Cast Procedure by [name], PA-C. Post procedure Diagnosis Wound #12R: Same as Pre-Procedure Plan Follow-up Appointments: Return Appointment in 1 week. Bathing/ Shower/ Hygiene: May shower; gently cleanse wound with antibacterial soap, rinse and pat dry prior to dressing wounds No tub bath. Off-Loading: T Contact Cast to Left Lower Extremity otal Open toe surgical shoe - right foot WOUND #12R: - Foot Wound Laterality: Plantar, Left, Medial Cleanser: Vashe 5.8 (oz) 1 x Per Week/30 Days Discharge Instructions: Use vashe 5.8 (oz) as directed Prim Dressing: Promogran Matrix 4.34 (in) 1 x Per Week/30 Days ary Discharge Instructions: Moisten w/normal saline or sterile water and apply into wound bed Prim Dressing: Silvercel Small 2x2 (in/in) 1 x Per Week/30 Days ary Discharge Instructions: Apply Silvercel Small 2x2 (in/in) over wound area Secondary Dressing: Gauze 1 x Per Week/30 Days Discharge Instructions: As directed: dry, moistened with saline or moistened with Dakins Solution Secured With: [employer] Sterile or Non-Sterile 6-ply 4.5x4 (yd/yd) 1 x Per Week/30 Days Discharge Instructions: Apply Kerlix as directed 1. Based on what I am seeing I do believe that the patient is going to benefit from trying to switch over to an offloading boot as opposed to continue with total contact cast. The cast unfortunately does not fit her exactly right her leg being too small I think this is causing too much movement and likely callus buildup I discussed that with her today. 2. I am good recommend as well that the patient should continue to monitor for any signs of infection or worsening. Based on what I am seeing I do believe that we are having good overall improvement with regard to the wound not getting worse but  at the same time I feel like that to get this completely closed and keep it such that she may benefit from the front offloading boot. I gave her the printout for this to order off of [employer] today. CHARISS, SHUTT [name] ([id number]) [id number] Page 21 of 22 We will see patient back for reevaluation in 1 week here in the clinic. If anything worsens or changes patient will contact our office for additional recommendations. Electronic Signature(s) Signed: 09/18/2023 9:45:06 AM By: [name] PA-C Entered By: [name] on 09/18/2023 09:45:06 -------------------------------------------------------------------------------- Total Contact Cast Details Patient Name: Date of Service: STA  LEY, Carrianne [name]. 09/18/2023 9:00 [name]: [id number] Patient Account Number: [id number] Date of Birth/Sex: Treating RN: 1976-06-15 (47 y.o. [name] Primary Care Provider: Lindwood [name] Other Clinician: [name] Referring Provider: Treating Provider/Extender: [name] in Treatment: 18 T Contact Cast Applied for Wound Assessment: otal Wound #12R Left,Medial,Plantar Foot Performed By: Physician [name], PA-C The following information was scribed by: [name] The information was scribed for: [name] Post Procedure Diagnosis Same as Pre-procedure Electronic Signature(s) Unsigned Entered By: [name] on 09/18/2023 09:26:33 -------------------------------------------------------------------------------- SuperBill Details Patient Name: Date of Service: BECKY, [place] 09/18/2023 Medical Record Number: [id number] Patient Account Number: [id number] Date of Birth/Sex: Treating RN: 10/08/1976 (47 y.o. [name] Primary Care Provider: Lindwood [name] Other Clinician: [name] Referring Provider: Treating Provider/Extender: [name] Weeks in Treatment: 78 Diagnosis Coding ICD-10  Codes Code Description E11.621 Type 2 diabetes mellitus with foot ulcer L97.528 Non-pressure chronic ulcer of other part of left foot with other specified severity SHUNTAE, TISDALE [name] ([id number]) [phone].pdf Page 22 of 22 E11.42 Type 2 diabetes mellitus with diabetic polyneuropathy Z79.4 Long term (current) use of insulin N18.6 End stage renal disease Z99.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 L84 Corns and callosities Facility Procedures : CPT4 Code: [id number] Description: 11042 - DEB SUBQ TISSUE 20 SQ CM/< ICD-10 Diagnosis Description L97.528 Non-pressure chronic ulcer of other part of left foot with other specified seve Modifier: rity Quantity: 1 Physician Procedures : CPT4 Code Description Modifier 11042 11042 - WC PHYS SUBQ TISS 20 SQ CM ICD-10 Diagnosis Description L97.528 Non-pressure chronic ulcer of other part of left foot with other specified severity Quantity: 1 Electronic Signature(s) Signed: 09/18/2023 9:45:16 AM By: [name] PA-C Entered By: [name] on 09/18/2023 09:45:16

## 2023-09-19 NOTE — Progress Notes (Signed)
Heather Heather, [name] Heather ([id number]) [id number] Page 1 of 8 Visit Report for 09/18/2023 Arrival Information Details Patient Name: Date of Service: [name], [name] 09/18/2023 9:00 Heather Heather Medical Record Number: [id number] Patient Account Number: [id number] Date of Birth/Sex: Treating RN: September 11, 1976 (47 y.o. Heather Heather Primary Care Heather Heather: Heather Heather Other Clinician: Betha [name] Referring Heather Heather: Treating Heather Heather: Heather Heather in Treatment: 31 Visit Information History Since Last Visit All ordered tests and consults were completed: No Patient Arrived: Wheel Chair Added or deleted any medications: No Arrival Time: 09:04 Any new allergies or adverse reactions: No Transfer Assistance: None Had Heather fall or experienced change in No Patient Identification Verified: Yes activities of daily living that may affect Secondary Verification Process Completed: Yes risk of falls: Patient Requires Transmission-Based Precautions: No Signs or symptoms of abuse/neglect since last visito No Patient Has Alerts: No Hospitalized since last visit: No Implantable device outside of the clinic excluding No cellular tissue based products placed in the center since last visit: Has Dressing in Place as Prescribed: Yes Has Footwear/Offloading in Place as Prescribed: Yes Left: T Contact Cast otal Pain Present Now: No Electronic Signature(s) Signed: 09/19/2023 12:51:23 PM By: Heather Heather Entered By: Heather Heather on 09/18/2023 09:06:20 -------------------------------------------------------------------------------- Clinic Level of Care Assessment Details Patient Name: Date of Service: Heather Heather, Heather Heather 09/18/2023 9:00 Heather Heather Medical Record Number: [id number] Patient Account Number: [id number] Date of Birth/Sex: Treating RN: [birth year] (47 y.o. Heather Heather Primary Care Heather Heather: Heather Heather Other Clinician: Betha [name] Referring  Heather Heather: Treating Heather Heather: Heather Heather in Treatment: 78 Clinic Level of Care Assessment Items TOOL 1 Quantity Score []  - 0 Use when EandM and Procedure is performed on INITIAL visit ASSESSMENTS - Nursing Assessment / Reassessment []  - 0 General Physical Exam (combine w/ comprehensive assessment (listed just below) when performed on new pt. evalsTALANDA, FERRIELL Heather ([id number]) [id number] Page 2 of 8 []  - 0 Comprehensive Assessment (HX, ROS, Risk Assessments, Wounds Hx, etc.) ASSESSMENTS - Wound and Skin Assessment / Reassessment []  - 0 Dermatologic / Skin Assessment (not related to wound area) ASSESSMENTS - Ostomy and/or Continence Assessment and Care []  - 0 Incontinence Assessment and Management []  - 0 Ostomy Care Assessment and Management (repouching, etc.) PROCESS - Coordination of Care []  - 0 Simple Patient / Family Education for ongoing care []  - 0 Complex (extensive) Patient / Family Education for ongoing care []  - 0 Staff obtains Chiropractor, Records, T Results / Process Orders est []  - 0 Staff telephones HHA, Nursing Homes / Clarify orders / etc []  - 0 Routine Transfer to another Facility (non-emergent condition) []  - 0 Routine Hospital Admission (non-emergent condition) []  - 0 New Admissions / Manufacturing engineer / Ordering NPWT Apligraf, etc. , []  - 0 Emergency Hospital Admission (emergent condition) PROCESS - Special Needs []  - 0 Pediatric / Minor Patient Management []  - 0 Isolation Patient Management []  - 0 Hearing / Language / Visual special needs []  - 0 Assessment of Community assistance (transportation, D/C planning, etc.) []  - 0 Additional assistance / Altered mentation []  - 0 Support Surface(s) Assessment (bed, cushion, seat, etc.) INTERVENTIONS - Miscellaneous []  - 0 External ear exam []  - 0 Patient Transfer (multiple staff / Nurse, adult / Similar devices) []  - 0 Simple Staple /  Suture removal (25 or less) []  - 0 Complex Staple / Suture removal (26 or more) []  - 0 Hypo/Hyperglycemic Management (do not check if billed separately) []  - 0 Ankle /  Brachial Index (ABI) - do not check if billed separately Has the patient been seen at the hospital within the last three years: Yes Total Score: 0 Level Of Care: ____ Electronic Signature(s) Signed: 09/19/2023 12:51:23 PM By: Heather Heather Entered By: Heather Heather on 09/18/2023 09:33:17 -------------------------------------------------------------------------------- Encounter Discharge Information Details Patient Name: Date of Service: Heather Heather, Heather Bandy Heather. 09/18/2023 9:00 Heather Heather Medical Record Number: [id number] Patient Account Number: [id number] Date of Birth/Sex: Treating RN: [birth year] (47 y.o. [name], [name], Jini Heather ([id number]) [id number] Page 3 of 8 Primary Care Heather Heather: Heather Heather Other Clinician: Betha [name] Referring Heather Heather: Treating Heather Heather: Heather Heather in Treatment: 31 Encounter Discharge Information Items Post Procedure Vitals Discharge Condition: Stable Temperature (F): 98.8 Ambulatory Status: Wheelchair Pulse (bpm): 65 Discharge Destination: Home Respiratory Rate (breaths/min): 18 Transportation: Private Auto Blood Pressure (mmHg): 94/56 Accompanied By: mother Schedule Follow-up Appointment: Yes Clinical Summary of Care: Electronic Signature(s) Signed: 09/19/2023 12:51:23 PM By: Heather Heather Entered By: Heather Heather on 09/18/2023 09:34:55 -------------------------------------------------------------------------------- Lower Extremity Assessment Details Patient Name: Date of Service: Heather Heather, Heather Heather 09/18/2023 9:00 Heather Heather Medical Record Number: [id number] Patient Account Number: [id number] Date of Birth/Sex: Treating RN: 10-Jan-1976 (47 y.o. Heather Heather Primary Care Heather Heather: Heather Heather Other Clinician:  Betha [name] Referring Heather Heather: Treating Heather Heather: Heather Heather Weeks in Treatment: 82 Electronic Signature(s) Signed: 09/18/2023 3:59:06 PM By: Yevonne Pax RN Signed: 09/19/2023 12:51:23 PM By: Heather Heather Entered By: Heather Heather on 09/18/2023 09:20:43 -------------------------------------------------------------------------------- Multi Wound Chart Details Patient Name: Date of Service: Heather Heather, Heather Bandy Heather. 09/18/2023 9:00 Heather Heather Medical Record Number: [id number] Patient Account Number: [id number] Date of Birth/Sex: Treating RN: Dec 26, 1975 (47 y.o. Heather Heather Primary Care Daryan Cagley: Heather Heather Other Clinician: Betha [name] Referring Adella Manolis: Treating Tanekia Ryans/Extender: Heather Heather in Treatment: 71 Vital Signs Height(in): 69 Pulse(bpm): 65 Weight(lbs): 178 Blood Pressure(mmHg): 94/56 Body Mass Index(BMI): 26.3 Temperature(F): 98.8 Respiratory Rate(breaths/min): 18 Sutcliffe, Heather Heather ([id number]) [id number] Page 4 of 8 [12R:Photos:] [N/Heather:N/Heather] Left, Medial, Plantar Foot N/Heather N/Heather Wound Location: Pressure Injury N/Heather N/Heather Wounding Event: Diabetic Wound/Ulcer of the Lower N/Heather N/Heather Primary Etiology: Extremity Chronic sinus problems/congestion, N/Heather N/Heather Comorbid History: Middle ear problems, Anemia, Chronic Obstructive Pulmonary Disease (COPD), Congestive Heart Failure, Type II Diabetes, End Stage Renal Disease, History of pressure wounds, Neuropathy 03/16/2020 N/Heather N/Heather Date Acquired: 6 N/Heather N/Heather Weeks of Treatment: Open N/Heather N/Heather Wound Status: Yes N/Heather N/Heather Wound Recurrence: 0.2x0.3x0.1 N/Heather N/Heather Measurements L x W x D (cm) 0.047 N/Heather N/Heather Heather (cm) : rea 0.005 N/Heather N/Heather Volume (cm) : 99.00% N/Heather N/Heather % Reduction in Heather rea: 99.50% N/Heather N/Heather % Reduction in Volume: Grade 3 N/Heather N/Heather Classification: Medium N/Heather N/Heather Exudate Heather mount: Serosanguineous N/Heather N/Heather Exudate Type: red, brown N/Heather N/Heather Exudate  Color: Flat and Intact N/Heather N/Heather Wound Margin: None Present (0%) N/Heather N/Heather Granulation Heather mount: None Present (0%) N/Heather N/Heather Necrotic Heather mount: Fat Layer (Subcutaneous Tissue): Yes N/Heather N/Heather Exposed Structures: Fascia: No Tendon: No Muscle: No Joint: No Bone: No Large (67-100%) N/Heather N/Heather Epithelialization: Treatment Notes Electronic Signature(s) Signed: 09/19/2023 12:51:23 PM By: Heather Heather Entered By: Heather Heather on 09/18/2023 09:20:49 -------------------------------------------------------------------------------- Multi-Disciplinary Care Plan Details Patient Name: Date of Service: Heather Jefferson Heather. 09/18/2023 9:00 Heather Heather Medical Record Number: [id number] Patient Account Number: [id number] Date of Birth/Sex: Treating RN: 1975/12/30 (47 y.o. Heather Heather Primary Care Kathyjo Briere: Heather Heather Other Clinician: Betha [name] Referring Glema Takaki: Treating Dandra Velardi/Extender: [name]  Heather Heather Weeks in Treatment: [address] ALEZAY, Heather Heather ([id number]) [id number] Page 5 of 8 Abuse / Safety / Falls / Self Care Management Nursing Diagnoses: History of Falls Potential for falls Potential for injury related to falls Goals: Patient will not develop complications from immobility Date Initiated: 03/17/2022 Date Inactivated: 04/20/2022 Target Resolution Date: 03/16/2022 Goal Status: Met Patient/caregiver will verbalize understanding of skin care regimen Date Initiated: 03/17/2022 Date Inactivated: 09/28/2022 Target Resolution Date: 03/16/2022 Goal Status: Met Patient/caregiver will verbalize/demonstrate measure taken to improve self care Date Initiated: 03/17/2022 Target Resolution Date: 06/21/2023 Goal Status: Active Interventions: Assess fall risk on admission and as needed Provide education on basic hygiene Provide education on personal and home safety Notes: Electronic Signature(s) Signed: 09/18/2023 3:59:06 PM By: Yevonne Pax  RN Signed: 09/19/2023 12:51:23 PM By: Heather Heather Entered By: Heather Heather on 09/18/2023 09:33:31 -------------------------------------------------------------------------------- Pain Assessment Details Patient Name: Date of Service: Heather Jefferson Heather. 09/18/2023 9:00 Heather Heather Medical Record Number: [id number] Patient Account Number: [id number] Date of Birth/Sex: Treating RN: September 07, 1976 (47 y.o. Heather Heather Primary Care Raeley Gilmore: Heather Heather Other Clinician: Betha [name] Referring Dannis Deroche: Treating Joury Allcorn/Extender: Heather Heather in Treatment: 15 Active Problems Location of Pain Severity and Description of Pain Patient Has Paino No Site Locations [employer], [place] Heather ([id number]) [id number] Page 6 of 8 Pain Management and Medication Current Pain Management: Electronic Signature(s) Signed: 09/18/2023 3:59:06 PM By: Yevonne Pax RN Signed: 09/19/2023 12:51:23 PM By: Heather Heather Entered By: Heather Heather on 09/18/2023 09:09:48 -------------------------------------------------------------------------------- Patient/Caregiver Education Details Patient Name: Date of Service: [name] 11/25/2024andnbsp9:00 Heather Heather Medical Record Number: [id number] Patient Account Number: [id number] Date of Birth/Gender: Treating RN: 29-Nov-1975 (47 y.o. Heather Heather Primary Care Physician: Heather Heather Other Clinician: Betha [name] Referring Physician: Treating Physician/Extender: Heather Heather in Treatment: 62 Education Assessment Education Provided To: Patient Education Topics Provided Wound/Skin Impairment: Handouts: Other: continue wound care as directed Methods: Explain/Verbal Responses: State content correctly Electronic Signature(s) Signed: 09/19/2023 12:51:23 PM By: Heather Heather Entered By: Heather Heather on 09/18/2023 09:33:48 [place] Heather ([id number]) [id number]  Page 7 of 8 -------------------------------------------------------------------------------- Wound Assessment Details Patient Name: Date of Service: Heather Heather, Heather Heather 09/18/2023 9:00 Heather Heather Medical Record Number: [id number] Patient Account Number: [id number] Date of Birth/Sex: Treating RN: 04/30/1976 (47 y.o. Heather Heather Primary Care Amberleigh Gerken: Heather Heather Other Clinician: Betha [name] Referring Donney Caraveo: Treating Phoua Hoadley/Extender: Heather Heather Weeks in Treatment: 78 Wound Status Wound Number: 12R Primary Diabetic Wound/Ulcer of the Lower Extremity Etiology: Wound Location: Left, Medial, Plantar Foot Wound Open Wounding Event: Pressure Injury Status: Date Acquired: 03/16/2020 Comorbid Chronic sinus problems/congestion, Middle ear problems, Anemia, Weeks Of Treatment: 78 History: Chronic Obstructive Pulmonary Disease (COPD), Congestive Heart Clustered Wound: No Failure, Type II Diabetes, End Stage Renal Disease, History of pressure wounds, Neuropathy Photos Wound Measurements Length: (cm) 0.2 Width: (cm) 0.3 Depth: (cm) 0.1 Area: (cm) 0.047 Volume: (cm) 0.005 % Reduction in Area: 99% % Reduction in Volume: 99.5% Epithelialization: Large (67-100%) Wound Description Classification: Grade 3 Wound Margin: Flat and Intact Exudate Amount: Medium Exudate Type: Serosanguineous Exudate Color: red, brown Foul Odor After Cleansing: No Slough/Fibrino Yes Wound Bed Granulation Amount: None Present (0%) Exposed Structure Necrotic Amount: None Present (0%) Fascia Exposed: No Fat Layer (Subcutaneous Tissue) Exposed: Yes Tendon Exposed: No Muscle Exposed: No Joint Exposed: No Bone Exposed: No Treatment Notes Wound #12R (Foot) Wound Laterality: Plantar, Left, Medial Cleanser Vashe 5.8 (oz) Discharge Instruction: Use vashe  5.8 (oz) as directed Heather Heather, Heather Heather ([id number]) [id number] Page 8 of 8 Heather-Wound Care Topical Primary  Dressing Promogran Matrix 4.34 (in) Discharge Instruction: Moisten w/normal saline or sterile water and apply into wound bed Silvercel Small 2x2 (in/in) Discharge Instruction: Apply Silvercel Small 2x2 (in/in) over wound area Secondary Dressing Gauze Discharge Instruction: As directed: dry, moistened with saline or moistened with Dakins Solution Secured With [employer] Sterile or Non-Sterile 6-ply 4.5x4 (yd/yd) Discharge Instruction: Apply Kerlix as directed Compression Wrap Compression Stockings Add-Ons Electronic Signature(s) Signed: 09/18/2023 3:59:06 PM By: Yevonne Pax RN Signed: 09/19/2023 12:51:23 PM By: Heather Heather Entered By: Heather Heather on 09/18/2023 09:20:29 -------------------------------------------------------------------------------- Vitals Details Patient Name: Date of Service: Heather Heather, Shanera Heather. 09/18/2023 9:00 Heather Heather Medical Record Number: [id number] Patient Account Number: [id number] Date of Birth/Sex: Treating RN: 02-01-1976 (47 y.o. Heather Heather Primary Care Anara Cowman: Heather Heather Other Clinician: Betha [name] Referring Jereld Presti: Treating Ellie Spickler/Extender: Heather Heather in Treatment: 78 Vital Signs Time Taken: 09:06 Temperature (F): 98.8 Height (in): 69 Pulse (bpm): 65 Weight (lbs): 178 Respiratory Rate (breaths/min): 18 Body Mass Index (BMI): 26.3 Blood Pressure (mmHg): 94/56 Reference Range: 80 - 120 mg / dl Electronic Signature(s) Signed: 09/19/2023 12:51:23 PM By: Heather Heather Entered By: Heather Heather on 09/18/2023 09:09:44

## 2023-09-25 ENCOUNTER — Encounter: Payer: MEDICAID | Attending: Physician Assistant | Admitting: Physician Assistant

## 2023-09-25 DIAGNOSIS — Z794 Long term (current) use of insulin: Secondary | ICD-10-CM | POA: Diagnosis not present

## 2023-09-25 DIAGNOSIS — B952 Enterococcus as the cause of diseases classified elsewhere: Secondary | ICD-10-CM | POA: Diagnosis not present

## 2023-09-25 DIAGNOSIS — Z89432 Acquired absence of left foot: Secondary | ICD-10-CM | POA: Diagnosis not present

## 2023-09-25 DIAGNOSIS — Z89421 Acquired absence of other right toe(s): Secondary | ICD-10-CM | POA: Diagnosis not present

## 2023-09-25 DIAGNOSIS — L97528 Non-pressure chronic ulcer of other part of left foot with other specified severity: Secondary | ICD-10-CM | POA: Insufficient documentation

## 2023-09-25 DIAGNOSIS — E1142 Type 2 diabetes mellitus with diabetic polyneuropathy: Secondary | ICD-10-CM | POA: Diagnosis not present

## 2023-09-25 DIAGNOSIS — L84 Corns and callosities: Secondary | ICD-10-CM | POA: Diagnosis not present

## 2023-09-25 DIAGNOSIS — B954 Other streptococcus as the cause of diseases classified elsewhere: Secondary | ICD-10-CM | POA: Insufficient documentation

## 2023-09-25 DIAGNOSIS — I5022 Chronic systolic (congestive) heart failure: Secondary | ICD-10-CM | POA: Insufficient documentation

## 2023-09-25 DIAGNOSIS — N186 End stage renal disease: Secondary | ICD-10-CM | POA: Insufficient documentation

## 2023-09-25 DIAGNOSIS — B950 Streptococcus, group A, as the cause of diseases classified elsewhere: Secondary | ICD-10-CM | POA: Diagnosis not present

## 2023-09-25 DIAGNOSIS — I132 Hypertensive heart and chronic kidney disease with heart failure and with stage 5 chronic kidney disease, or end stage renal disease: Secondary | ICD-10-CM | POA: Diagnosis not present

## 2023-09-25 DIAGNOSIS — E11621 Type 2 diabetes mellitus with foot ulcer: Secondary | ICD-10-CM | POA: Insufficient documentation

## 2023-09-25 DIAGNOSIS — Z99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: Secondary | ICD-10-CM | POA: Insufficient documentation

## 2023-09-25 DIAGNOSIS — E1122 Type 2 diabetes mellitus with diabetic chronic kidney disease: Secondary | ICD-10-CM | POA: Insufficient documentation

## 2023-09-25 DIAGNOSIS — B965 Pseudomonas (aeruginosa) (mallei) (pseudomallei) as the cause of diseases classified elsewhere: Secondary | ICD-10-CM | POA: Diagnosis not present

## 2023-09-25 NOTE — Progress Notes (Signed)
HENNESSEY, KOTECKI A ([id number]) [id number] Page 1 of 8 Visit Report for 09/25/2023 Arrival Information Details Patient Name: Date of Service: KEYOKA, KUNKLE 09/25/2023 9:00 A M Medical Record Number: [id number] Patient Account Number: [id number] Date of Birth/Sex: Treating RN: 01/16/1976 (47 y.o. [name] Primary Care Ryle Buscemi: [name] Other Clinician: [name] Referring Maalle Starrett: Treating Burle Kwan/Extender: [name] in Treatment: 85 Visit Information History Since Last Visit All ordered tests and consults were completed: No Patient Arrived: Wheel Chair Added or deleted any medications: No Arrival Time: 09:07 Any new allergies or adverse reactions: No Transfer Assistance: None Had a fall or experienced change in No Patient Identification Verified: Yes activities of daily living that may affect Secondary Verification Process Completed: Yes risk of falls: Patient Requires Transmission-Based Precautions: No Signs or symptoms of abuse/neglect since last visito No Patient Has Alerts: No Hospitalized since last visit: No Implantable device outside of the clinic excluding No cellular tissue based products placed in the center since last visit: Has Dressing in Place as Prescribed: Yes Has Footwear/Offloading in Place as Prescribed: Yes Left: T Contact Cast otal Pain Present Now: No Electronic Signature(s) Signed: 09/25/2023 4:03:25 PM By: [name] Entered By: [name] on 09/25/2023 09:08:09 -------------------------------------------------------------------------------- Clinic Level of Care Assessment Details Patient Name: Date of Service: AMER, JAYNES 09/25/2023 9:00 A M Medical Record Number: [id number] Patient Account Number: [id number] Date of Birth/Sex: Treating RN: [birth year] (47 y.o. [name] Primary Care Sherissa Tenenbaum: [name] Other Clinician: [name] Referring  Orion Mole: Treating Amaury Kuzel/Extender: [name] in Treatment: 79 Clinic Level of Care Assessment Items TOOL 1 Quantity Score []  - 0 Use when EandM and Procedure is performed on INITIAL visit ASSESSMENTS - Nursing Assessment / Reassessment []  - 0 General Physical Exam (combine w/ comprehensive assessment (listed just below) when performed on new pt. evalsANIQA, MAURY A ([id number]) [id number] Page 2 of 8 []  - 0 Comprehensive Assessment (HX, ROS, Risk Assessments, Wounds Hx, etc.) ASSESSMENTS - Wound and Skin Assessment / Reassessment []  - 0 Dermatologic / Skin Assessment (not related to wound area) ASSESSMENTS - Ostomy and/or Continence Assessment and Care []  - 0 Incontinence Assessment and Management []  - 0 Ostomy Care Assessment and Management (repouching, etc.) PROCESS - Coordination of Care []  - 0 Simple Patient / Family Education for ongoing care []  - 0 Complex (extensive) Patient / Family Education for ongoing care []  - 0 Staff obtains Chiropractor, Records, T Results / Process Orders est []  - 0 Staff telephones HHA, Nursing Homes / Clarify orders / etc []  - 0 Routine Transfer to another Facility (non-emergent condition) []  - 0 Routine Hospital Admission (non-emergent condition) []  - 0 New Admissions / Manufacturing engineer / Ordering NPWT Apligraf, etc. , []  - 0 Emergency Hospital Admission (emergent condition) PROCESS - Special Needs []  - 0 Pediatric / Minor Patient Management []  - 0 Isolation Patient Management []  - 0 Hearing / Language / Visual special needs []  - 0 Assessment of Community assistance (transportation, D/C planning, etc.) []  - 0 Additional assistance / Altered mentation []  - 0 Support Surface(s) Assessment (bed, cushion, seat, etc.) INTERVENTIONS - Miscellaneous []  - 0 External ear exam []  - 0 Patient Transfer (multiple staff / Nurse, adult / Similar devices) []  - 0 Simple Staple /  Suture removal (25 or less) []  - 0 Complex Staple / Suture removal (26 or more) []  - 0 Hypo/Hyperglycemic Management (do not check if billed separately) []  - 0 Ankle /  Brachial Index (ABI) - do not check if billed separately Has the patient been seen at the hospital within the last three years: Yes Total Score: 0 Level Of Care: ____ Electronic Signature(s) Signed: 09/25/2023 4:03:25 PM By: [name] Entered By: [name] on 09/25/2023 10:23:03 -------------------------------------------------------------------------------- Encounter Discharge Information Details Patient Name: Date of Service: [name] 09/25/2023 9:00 A M Medical Record Number: [id number] Patient Account Number: [id number] Date of Birth/Sex: Treating RN: 12/06/1975 (47 y.o. Camoni, Ciliberto, Queenie A ([id number]) [id number] Page 3 of 8 Primary Care Kelda Azad: [name] Other Clinician: [name] Referring Sayge Salvato: Treating Hoy Fallert/Extender: [name] in Treatment: 26 Encounter Discharge Information Items Post Procedure Vitals Discharge Condition: Stable Temperature (F): 98.5 Ambulatory Status: Wheelchair Pulse (bpm): 62 Discharge Destination: Home Respiratory Rate (breaths/min): 18 Transportation: Private Auto Blood Pressure (mmHg): 111/63 Accompanied By: mother Schedule Follow-up Appointment: Yes Clinical Summary of Care: Electronic Signature(s) Signed: 09/25/2023 4:03:25 PM By: [name] Entered By: [name] on 09/25/2023 11:51:05 -------------------------------------------------------------------------------- Lower Extremity Assessment Details Patient Name: Date of Service: SHAMETRA, HSIEH 09/25/2023 9:00 A M Medical Record Number: [id number] Patient Account Number: [id number] Date of Birth/Sex: Treating RN: 13-Apr-1976 (47 y.o. [name] Primary Care Dalyla Chui: [name] Other Clinician: [name] Referring Valdemar Mcclenahan: Treating Ariani Seier/Extender: [name] Weeks in Treatment: 21 Electronic Signature(s) Signed: 09/25/2023 3:33:10 PM By: Yevonne Pax RN Signed: 09/25/2023 4:03:25 PM By: [name] Entered By: [name] on 09/25/2023 09:23:37 -------------------------------------------------------------------------------- Multi Wound Chart Details Patient Name: Date of Service: [name] 09/25/2023 9:00 A M Medical Record Number: [id number] Patient Account Number: [id number] Date of Birth/Sex: Treating RN: 07-Oct-1976 (47 y.o. [name] Primary Care Deysy Schabel: [name] Other Clinician: [name] Referring Osten Janek: Treating Anacarolina Evelyn/Extender: [name] in Treatment: 54 Vital Signs Height(in): 69 Pulse(bpm): 62 Weight(lbs): 178 Blood Pressure(mmHg): 111/63 Body Mass Index(BMI): 26.3 Temperature(F): 98.5 Respiratory Rate(breaths/min): 16 Wussow, Katheryne A ([id number]) [id number] Page 4 of 8 [12R:Photos:] [N/A:N/A] Left, Medial, Plantar Foot N/A N/A Wound Location: Pressure Injury N/A N/A Wounding Event: Diabetic Wound/Ulcer of the Lower N/A N/A Primary Etiology: Extremity Chronic sinus problems/congestion, N/A N/A Comorbid History: Middle ear problems, Anemia, Chronic Obstructive Pulmonary Disease (COPD), Congestive Heart Failure, Type II Diabetes, End Stage Renal Disease, History of pressure wounds, Neuropathy 03/16/2020 N/A N/A Date Acquired: 34 N/A N/A Weeks of Treatment: Open N/A N/A Wound Status: Yes N/A N/A Wound Recurrence: 0.2x0.2x0.1 N/A N/A Measurements L x W x D (cm) 0.031 N/A N/A A (cm) : rea 0.003 N/A N/A Volume (cm) : 99.30% N/A N/A % Reduction in A rea: 99.70% N/A N/A % Reduction in Volume: Grade 3 N/A N/A Classification: Medium N/A N/A Exudate A mount: Serosanguineous N/A N/A Exudate Type: red, brown N/A N/A Exudate Color: Flat and  Intact N/A N/A Wound Margin: None Present (0%) N/A N/A Granulation A mount: None Present (0%) N/A N/A Necrotic A mount: Fat Layer (Subcutaneous Tissue): Yes N/A N/A Exposed Structures: Fascia: No Tendon: No Muscle: No Joint: No Bone: No Medium (34-66%) N/A N/A Epithelialization: Treatment Notes Electronic Signature(s) Signed: 09/25/2023 4:03:25 PM By: [name] Entered By: [name] on 09/25/2023 09:23:42 -------------------------------------------------------------------------------- Multi-Disciplinary Care Plan Details Patient Name: Date of Service: [name] 09/25/2023 9:00 A M Medical Record Number: [id number] Patient Account Number: [id number] Date of Birth/Sex: Treating RN: September 13, 1976 (47 y.o. [name] Primary Care Lawana Hartzell: [name] Other Clinician: [name] Referring Aryan Sparks: Treating Square Jowett/Extender: [name]  [name] Weeks in Treatment: [address] ROMY, WEEBER A ([id number]) [id number] Page 5 of 8 Abuse / Safety / Falls / Self Care Management Nursing Diagnoses: History of Falls Potential for falls Potential for injury related to falls Goals: Patient will not develop complications from immobility Date Initiated: 03/17/2022 Date Inactivated: 04/20/2022 Target Resolution Date: 03/16/2022 Goal Status: Met Patient/caregiver will verbalize understanding of skin care regimen Date Initiated: 03/17/2022 Date Inactivated: 09/28/2022 Target Resolution Date: 03/16/2022 Goal Status: Met Patient/caregiver will verbalize/demonstrate measure taken to improve self care Date Initiated: 03/17/2022 Target Resolution Date: 06/21/2023 Goal Status: Active Interventions: Assess fall risk on admission and as needed Provide education on basic hygiene Provide education on personal and home safety Notes: Electronic Signature(s) Signed: 09/25/2023 3:33:10 PM By: Yevonne Pax RN Signed: 09/25/2023 4:03:25  PM By: [name] Entered By: [name] on 09/25/2023 10:23:23 -------------------------------------------------------------------------------- Pain Assessment Details Patient Name: Date of Service: [name] 09/25/2023 9:00 A M Medical Record Number: [id number] Patient Account Number: [id number] Date of Birth/Sex: Treating RN: 07-05-1976 (47 y.o. [name] Primary Care Dessiree Sze: [name] Other Clinician: [name] Referring Olanda Downie: Treating Shaquera Ansley/Extender: [name] in Treatment: 62 Active Problems Location of Pain Severity and Description of Pain Patient Has Paino No Site Locations [place], [place] A ([id number]) [id number] Page 6 of 8 Pain Management and Medication Current Pain Management: Electronic Signature(s) Signed: 09/25/2023 3:33:10 PM By: Yevonne Pax RN Signed: 09/25/2023 4:03:25 PM By: [name] Entered By: [name] on 09/25/2023 09:11:56 -------------------------------------------------------------------------------- Patient/Caregiver Education Details Patient Name: Date of Service: [name] 12/2/2024andnbsp9:00 A M Medical Record Number: [id number] Patient Account Number: [id number] Date of Birth/Gender: Treating RN: [birth year] (47 y.o. [name] Primary Care Physician: [name] Other Clinician: [name] Referring Physician: Treating Physician/Extender: [name] in Treatment: 87 Education Assessment Education Provided To: Patient Education Topics Provided Wound/Skin Impairment: Handouts: Other: continue wound care as directed Methods: Explain/Verbal Responses: State content correctly Electronic Signature(s) Signed: 09/25/2023 4:03:25 PM By: [name] Entered By: [name] on 09/25/2023 11:50:03 Earmon Phoenix A ([id number]) [id number] Page 7 of  8 -------------------------------------------------------------------------------- Wound Assessment Details Patient Name: Date of Service: SHELSEY, JERGE 09/25/2023 9:00 A M Medical Record Number: [id number] Patient Account Number: [id number] Date of Birth/Sex: Treating RN: [birth year] (47 y.o. [name] Primary Care Oland Arquette: [name] Other Clinician: [name] Referring Amma Crear: Treating Aniceto Kyser/Extender: [name] Weeks in Treatment: 79 Wound Status Wound Number: 12R Primary Diabetic Wound/Ulcer of the Lower Extremity Etiology: Wound Location: Left, Medial, Plantar Foot Wound Open Wounding Event: Pressure Injury Status: Date Acquired: 03/16/2020 Comorbid Chronic sinus problems/congestion, Middle ear problems, Anemia, Weeks Of Treatment: 79 History: Chronic Obstructive Pulmonary Disease (COPD), Congestive Heart Clustered Wound: No Failure, Type II Diabetes, End Stage Renal Disease, History of pressure wounds, Neuropathy Photos Wound Measurements Length: (cm) 0.2 Width: (cm) 0.2 Depth: (cm) 0.1 Area: (cm) 0.03 Volume: (cm) 0.00 % Reduction in Area: 99.3% % Reduction in Volume: 99.7% Epithelialization: Medium (34-66%) 1 3 Wound Description Classification: Grade 3 Wound Margin: Flat and Intact Exudate Amount: Medium Exudate Type: Serosanguineous Exudate Color: red, brown Foul Odor After Cleansing: No Slough/Fibrino Yes Wound Bed Granulation Amount: None Present (0%) Exposed Structure Necrotic Amount: None Present (0%) Fascia Exposed: No Fat Layer (Subcutaneous Tissue) Exposed: Yes Tendon Exposed: No Muscle Exposed: No Joint Exposed: No Bone Exposed: No Treatment Notes Wound #12R (Foot) Wound Laterality: Plantar, Left, Medial Cleanser Vashe 5.8 (oz) Discharge Instruction:  Use vashe 5.8 (oz) as directed KENSEY, [place] A ([id number]) [id number] Page 8 of 8 Peri-Wound Care Topical Primary  Dressing Promogran Matrix 4.34 (in) Discharge Instruction: Moisten w/normal saline or sterile water and apply into wound bed Secondary Dressing Gauze Discharge Instruction: As directed: dry, moistened with saline or moistened with Dakins Solution Secured With Medipore T - 19M Medipore H Soft Cloth Surgical T ape ape, 2x2 (in/yd) Kerlix Roll Sterile or Non-Sterile 6-ply 4.5x4 (yd/yd) Discharge Instruction: Apply Kerlix as directed Stretch Net Dressing, Latex-free, Size 5, Small-Head / Shoulder / Thigh Compression Wrap Compression Stockings Add-Ons Electronic Signature(s) Signed: 09/25/2023 3:33:10 PM By: Yevonne Pax RN Signed: 09/25/2023 4:03:25 PM By: [name] Entered By: [name] on 09/25/2023 09:23:26 -------------------------------------------------------------------------------- Vitals Details Patient Name: Date of Service: [name], Arvie A. 09/25/2023 9:00 A M Medical Record Number: [id number] Patient Account Number: [id number] Date of Birth/Sex: Treating RN: [birth year] (47 y.o. [name] Primary Care Reveca Desmarais: [name] Other Clinician: [name] Referring Jachelle Fluty: Treating Nikki Rusnak/Extender: [name] in Treatment: 79 Vital Signs Time Taken: 09:08 Temperature (F): 98.5 Height (in): 69 Pulse (bpm): 62 Weight (lbs): 178 Respiratory Rate (breaths/min): 16 Body Mass Index (BMI): 26.3 Blood Pressure (mmHg): 111/63 Reference Range: 80 - 120 mg / dl Electronic Signature(s) Signed: 09/25/2023 4:03:25 PM By: [name] Entered By: [name] on 09/25/2023 09:11:51

## 2023-09-25 NOTE — Progress Notes (Signed)
FAITHANN, VALLESE A ([id number]) [id number] Page 1 of 21 Visit Report for 09/25/2023 Chief Complaint Document Details Patient Name: Date of Service: GRACEY, DEPRIMO 09/25/2023 9:00 A M Medical Record Number: [id number] Patient Account Number: [id number] Date of Birth/Sex: Treating RN: [birth year] (47 y.o. [name] Primary Care Provider: [name] Other Clinician: [name] Referring Provider: Treating Provider/Extender: [name] Weeks in Treatment: 55 Information Obtained from: Patient Chief Complaint Left foot ulcer Electronic Signature(s) Signed: 09/25/2023 10:33:39 AM By: [name] PA-C Entered By: [name] on 09/25/2023 10:33:39 -------------------------------------------------------------------------------- Debridement Details Patient Name: Date of Service: [name] 09/25/2023 9:00 A M Medical Record Number: [id number] Patient Account Number: [id number] Date of Birth/Sex: Treating RN: [birth year] (47 y.o. [name] Primary Care Provider: [name] Other Clinician: [name] Referring Provider: Treating Provider/Extender: [name] in Treatment: 79 Debridement Performed for Assessment: Wound #12R Left,Medial,Plantar Foot Performed By: Physician [name], PA-C Debridement Type: Debridement Severity of Tissue Pre Debridement: Fat layer exposed Level of Consciousness (Pre-procedure): Awake and Alert Pre-procedure Verification/Time Out Yes - 10:00 Taken: Start Time: 10:00 Percent of Wound Bed Debrided: 100% T Area Debrided (cm): otal 0.03 Tissue and other material debrided: Viable, Non-Viable, Callus, Slough, Subcutaneous, Slough Level: Skin/Subcutaneous Tissue Debridement Description: Excisional Instrument: Curette Bleeding: Minimum Hemostasis Achieved: Pressure Response to Treatment: Procedure was tolerated well Level of Consciousness (Post- Awake and  Alert procedure): YARIBETH, HARRIGAN A ([id number]) [phone].pdf Page 2 of 21 Post Debridement Measurements of Total Wound Length: (cm) 0.2 Width: (cm) 0.2 Depth: (cm) 0.1 Volume: (cm) 0.003 Character of Wound/Ulcer Post Debridement: Stable Severity of Tissue Post Debridement: Fat layer exposed Post Procedure Diagnosis Same as Pre-procedure Electronic Signature(s) Signed: 09/25/2023 3:33:10 PM By: Yevonne Pax RN Signed: 09/25/2023 3:39:27 PM By: [name] PA-C Signed: 09/25/2023 4:03:25 PM By: [name] Entered By: [name] on 09/25/2023 10:04:10 -------------------------------------------------------------------------------- HPI Details Patient Name: Date of Service: [name], Walda A. 09/25/2023 9:00 A M Medical Record Number: [id number] Patient Account Number: [id number] Date of Birth/Sex: Treating RN: [birth year] (47 y.o. [name] Primary Care Provider: [name] Other Clinician: [name] Referring Provider: Treating Provider/Extender: [name] in Treatment: 55 History of Present Illness HPI Description: 01/18/18-She is here for initial evaluation of the left great toe ulcer. She is a poor historian in regards to timeframe in detail. She states approximately 4 weeks ago she lacerated her toe on something in the house. She followed up with her primary care who placed her on Bactrim and ultimately a second dose of Bactrim prior to coming to wound clinic. She states she has been treating the toe with peroxide, Betadine and a Band-Aid. She did not check her blood sugar this morning but checked it yesterday morning it was 327; she is unaware of a recent A1c and there are no current records. She saw Dr. she would've orthopedics last week for an old injury to the left ankle, she states he did not see her toe, nor did she bring it to his attention. She smokes approximately 1 pack cigarettes a day. Her social  situation is concerning, she arrives this morning with her mother who appears extremely intoxicated/under the influence; her mother was asked to leave the room and be monitored by the patient's grandmother. The patient's aunt then accompanied the patient and the room throughout the rest of the appointment. We had a lengthy discussion regarding the deleterious effects of uncontrolled hyperglycemia and smoking as it  relates to wound healing and overall health. She was strongly encouraged to decrease her smoking and get her diabetes under better control. She states she is currently on a diet and has cut down her [facility] consumption. The left toe is erythematous, macerated and slightly edematous with malodor present. The edema in her left foot is below her baseline, there is no erythema streaking. We will treat her with Santyl, doxycycline; we have ordered and xray, culture and provided a Peg assist surgical shoe and cultured the wound. 01/25/18-She is here in follow-up evaluation for a left great toe ulcer and presents with an abscess to her suprapubic area. She states her blood sugars remain elevated, feeling "sick" and if levels are below 250, but she is trying. She has made no attempt to decrease her smoking stating that we "can't take away her food in her cigarettes". She has been compliant with offloading using the PEG assist you. She is using Santyl daily. the culture obtained last week grew staph aureus and Enterococcus faecalis; continues on the doxycycline and Augmentin was added on Monday. The suprapubic area has erythema, no femoral variation, purple discoloration, minimal induration, was accessed with a cotton tip applicator with sanguinopurulent drainage, this was cultured, I suspect the current antibiotic treatment will cover and we will not add anything to her current treatment plan. She was advised to go to urgent care or ER with any change in redness, induration or fever. 02/01/18-She  is here in follow-up evaluation for left great toe ulcers and a new abdominal abscess from last week. She was able to use packing until earlier this week, where she "forgot it was there". She states she was feeling ill with GI symptoms last week and was not taking her antibiotic. She states her glucose levels have been predominantly less than 200, with occasional levels between 200-250. She thinks this was contributing to her GI symptoms as they have resolved without intervention. There continues to be significant laceration to left toe, otherwise it clinically looks stable/improved. There is now less superficial opening to the lateral aspect of the great toe that was residual blister. We will transition to [facility] to all wounds, she will continue her Augmentin. If there is no change or deterioration next week for reculture. 02/08/18-She is here in follow-up evaluation for left great toe ulcer and abdominal ulcer. There is an improvement in both wounds. She has been wrapping her left toe with coban, not by our direction, which has created an area of discoloration to the medial aspect; she has been advised to NOT use coban secondary to her neuropathy. She states her glucose levels have been high over this last week ranging from 200-350, she continues to smoke. She admits to being less compliant with her offloading shoe. We will continue with same treatment plan and she will follow-up next week. 02/15/18-She is here in follow-up evaluation for left great toe ulcer and abdominal ulcer. The abdominal ulcer is epithelialized. The left great toe ulcer is improved and all injury from last week using the Coban wrap is resolved, the lateral ulcer is healed. She admits to noncompliance with wearing offloading shoe and admits to glucose levels being greater than 300 most of the week. She continues to smoke and expresses no desire to quit. There is one area medially that probes deeper than it has historically,  erythema to the toe and dorsal foot has consistently waxed and waned. There is no overt signs of cellulitis or infection but we will culture  the wound for any occult infection given the new area of depth and erythema. We will hold off on sensitivities for initiation of antibiotic therapy. 02/22/18-She is here in follow up evaluation for left great toe ulcer. There is overall significant improvement in both wound appearance, erythema and edema with changes made last week. She was not initiated on antibiotic therapy. Culture obtained last week showed oxacillin sensitive staph aureus, sensitive to ELOWYN, CLEMENTI A ([id number]) [phone].pdf Page 3 of 21 clindamycin. Clindamycin has been called into the pharmacy but she has been instructed to hold off on initiation secondary to overall clinical improvement and her history of antibiotic intolerance. She has been instructed to contact the clinic with any noted changes/deterioration and the wound, erythema, edema and/or pain. She will follow-up next week. She continues to smoke and her glucose levels remain elevated >250; she admits to compliance with offloading shoe 03/01/18 on evaluation today patient appears to be doing fairly well in regard to her left first toe ulcer. She has been tolerating the dressing changes with the [facility] Dressing without complication and overall this has definitely showed signs of improvement according to records as well is what the patient tells me today. I'm very pleased in that regard. She is having no pain today 03/08/18 She is here for follow up evaluation of a left great toe ulcer. She remains non-compliant with glucose control and smoking cessation; glucose levels consistently >200. She states that she got new shoe inserts/peg assist. She admits to compliance with offloading. Since my last evaluation there is significant improvement. We will switch to prisma at this time and she will follow up  next week. She is noted to be tachycardic at this appointment, heart rate 120s; she has a history of heart rate 70-130 according to our records. She admits to extreme agitation r/t personal issues; she was advised to monitor her heartrate and contact her physician if it does not return to a more normal range (<100). She takes cardizem twice daily. 03/15/18-She is here in follow-up evaluation for left great toe ulcer. She remains noncompliant with glucose control and smoking cessation. She admits to compliance with wearing offloading shoe. The ulcer is improved/stable and we will continue with the same treatment plan and she will follow-up next week 03/22/18-She is here for evaluation for left great toe ulcer. There continues to be significant improvement despite recurrent hyperglycemia (over 500 yesterday) and she continues to smoke. She has been compliant with offloading and we will continue with same treatment plan and she will follow-up next week. 03/29/18-She is here for evaluation for left great toe ulcer. Despite continuing to smoke and uncontrolled diabetes she continues to improve. She is compliant with offloading shoe. We will continue with the same treatment plan and she will follow-up next week 04/05/18- She is here in follow up evaluation for a left great toe ulcer; she presents with small pustule to left fifth toe (resembles ant bite). She admits to compliance with wearing offloading shoe; continues to smoke or have uncontrolled blood glucose control. There is more callus than usual with evidence of bleeding; she denies known trauma. 04/12/18-She is here for evaluation of left great toe ulcer. Despite noncompliance with glycemic control and smoking she continues to make improvement. She continues to wear offloading shoe. The pustule, that was identified last week, to the left fifth toe is resolved. She will follow-up in 2 weeks 05/03/18-she is seen in follow-up evaluation for a left great toe  ulcer. She is compliant with  offloading, otherwise noncompliant with glycemic control and smoking. She has plateaued and there is minimal improvement noted. We will transition to [facility], replaced the insert to her surgical shoe and she will follow-up in one week 05/10/18- She is here in follow up evaluation for a left great toe ulcer. It appears stable despite measurement change. We will continue with same treatment plan and follow up next week. 05/24/18-She is seen in follow-up evaluation for a left great toe ulcer. She remains compliant with offloading, has made significant improvement in her diet, decreasing the amount of sugar/soda. She said her recent A1c was 10.9 which is lower than. She did see a diabetic nutritionist/educator yesterday. She continues to smoke. We will continue with the same treatment plan and she'll follow-up next week. 05/31/18- She is seen in follow-up evaluation for left great toe ulcer. She continues to remain compliant with offloading, continues to make improvement in her diet, increasing her water and decreasing the amount of sugar/soda. She does continue to smoke with no desire to quit. We will apply Prisma to the depth and Hydrofera Blue over. We have not received insurance authorization for oasis. She will follow up next week. 06/07/18-She is seen in follow-up evaluation for left great toe ulcer. It has stalled according to today's measurements although base appears stable. She says she saw a diabetic educator yesterday; her average blood sugars are less than 300 which is an improvement for her. She continues to smoke and states "that's my next step" She continues with water over soda. We will order for xray, culture and reinstate ace wrap compression prior to placing apligraf for next week. She is voicing no complaints or concerns. Her dressing will change to iodoflex over the next week in preparation for apligraf. 06/14/18-She is seen in follow-up evaluation for  left great toe ulcer. Plain film x-ray performed last week was negative for osteomyelitis. Wound culture obtained last week grew strep B and OSSA; she is initiated on keflex and cefdinir today; there is erythema to the toe which could be from ace wrap compression, she has a history of wrapping too tight and has has been encouraged to maintain ace wraps that we place today. We will hold off on application of apligraf today, will apply next week after antibiotic therapy has been initiated. She admits today that she has resumed taking a shower with her foot/toe submerged in water, she has been reminded to keep foot/toe out of the bath water. She will be seen in follow up next week 06/21/18-she is seen in follow-up evaluation for left great toe ulcer. She is tolerating antibiotic therapy with no GI disturbance. The wound is stable. Apligraf was applied today. She has been decreasing her smoking, only had 4 cigarettes yesterday and 1 today. She continues being more compliant in diabetic diet. She will follow-up next week for evaluation of site, if stable will remove at 2 weeks. 06/28/18- She is here in follow up evalution. Apligraf was placed last week, she states the dressing fell off on Tuesday and she was dressing with hydrofera blue. She is healed and will be discharged from the clinic today. She has been instructed to continue with smoking cessation, continue monitoring glucose levels, offloading for an additional 4 weeks and continue with hydrofera blue for additional two weeks for any possible microscopic opening. Readmission: 08/07/18 on evaluation today patient presents for reevaluation concerning the ulcer of her right great toe. She was previously discharged on 06/28/18 healed. Nonetheless she states that this began  to show signs of drainage she subsequently went to her primary care provider. Subsequently an x-ray was performed on 08/01/18 which was negative. The patient was also placed on antibiotics  at that time. Fortunately they should have been effective for the infection. Nonetheless she's been experiencing some improvement but still has a lot of drainage coming from the wound itself. 08/14/18 on evaluation today patient's wound actually does show signs of improvement in regard to the erythema at this point. She has completed the antibiotics. With that being said we did discuss the possibility of placing her in a total contact cast as of today although I think that I may want to give this just a little bit more time to ensure nothing recurrence as far as her infection is concerned. I do not want to put in the cast and risk infection at that time if things are not completely resolved. With that being said she is gonna require some debridement today. 08/21/18 on evaluation today patient actually appears to be doing okay in regard to her toe ulcer. She's been tolerating the dressing changes without complication. With that being said it does appear that she is ready and in fact I think it's appropriate for [place] to go ahead and initiate the total contact cast today. Nonetheless she will require some sharp debridement to prepare the wound for application. Overall I feel like things have been progressing well but we do need to do something to get this to close more readily. 08/24/18 patient seen today for reevaluation after having had the total contact cast applied on Tuesday. She seems to have done very well the wound appears to be doing great and overall I'm pleased with the progress that she's made. There were no abnormal areas of rubbing from the cast on her lower extremity. 08/30/18 on evaluation today patient actually appears to be completely healed in regard to her plantar toe ulcer. She tells me at this point she's been having a lot of issues with the cast. She almost fell a couple of times the state shall the step of her dog a couple times as well. This is been a very frustrating process for her  other nonetheless she has completely healed the wound which is excellent news. Overall there does not appear to be the evidence of infection at this time which is great news. 09/11/18 evaluation today patient presents for follow-up concerning her great toe ulcer on the left which has unfortunately reopened since I last saw her which was only a couple of weeks ago. Unfortunately she was not able to get in to get the shoe and potentially the AFO that's gonna be necessary due to her left foot drop. She continues with offloading shoe but this is not enough to prevent her from reopening it appears. When we last had her in the total contact cast she did well from a healing standpoint but unfortunately the wound reopened as soon as she came out of the cast within just a couple of weeks. 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 This is also part of the reason why she falls on a regular basis. I just do not believe that is gonna be safe for [place] to reinitiate the total contact cast as last time we had this  on she fell 3 times one week which is definitely not normal for her. 09/18/18 upon evaluation today the patient actually appears to be doing about the same in regard to her toe ulcer. She did not contact [employer] as I asked her to even though I had given her the prescription. In fact she actually states that she has no idea where the prescription is. She did apparently call [employer] and they told her that all she needed to do was bring the prescription in order to be able to be seen and work on getting the AFO for her left foot. With all that being said she still does not have an appointment and I'm not sure were things stand that regard. I will give her a new prescription today in order to contact them to get this  set up. 09/25/18 on evaluation today patient actually appears to be doing about the same in regard to her toes ulcer. She does have a small areas which seems to have a lot of callous buildup around the edge of the wound which is going to need sharp debridement today. She still is waiting to be scheduled for evaluation with GERARDETTE, WILLSEY A ([id number]) [id number] Page 4 of 21 [employer] for possibility of an AFO. She states there supposed to call her tomorrow to get this set up. Unfortunately it does appear that her foot specifically the toe area is showing signs of erythema. There does not appear to be any systemic infection which is in these good news. 10/02/18 on evaluation today patient actually appears to be doing about the same in regard to her toe ulcer. This really has not done too well although it's not significantly larger it's also not significantly smaller. She has been tolerating the dressing changes without complication. She actually has her appointment with [facility] and [place] tomorrow to hopefully be measured for obtaining and AFO splint. I think this would be helpful preventing this from reoccurring. We had contemplated starting the cast this week although to be honest I am reluctant to do that as she's been having nausea, vomiting, and seizure activity over the past three days. She has a history of seizures and have been told is nothing that can be done for these. With that being said I do believe that along with the seizures have the nausea vomiting which upon further questioning doesn't seem to be the normal for her and makes me concerned for the possibility of infection or something else going on. I discussed this with the patient and her mother during the office visit today. I do not feel the wound is effective but maybe something else. The responses this was "this just happens to her at times and we don't know why". They did not seem to be interested in  going to the hospital to have this checked out further. 10/09/18 on evaluation today patient presents for follow-up concerning her ongoing toe ulcer. She has been tolerating the dressing changes without complication. Fortunately there does not appear to be any evidence of infection which is great news however I do think that the patient would benefit from going ahead for with the total contact cast. She's actually in a wheelchair today she tells me that she will use her walker if we initiate the cast. I was very specific about the fact that if we were gonna do the cast I wanted to make sure that she was using the walker in order to prevent any falls. She tells me she does not  have stairs that she has to traverse on a regular basis at her home. She has not had any seizures since last week again that something that happens to her often she tells me she did talk to [employer] and they said that it may take up to three weeks to get the brace approved for her. Hopefully that will not take that long but nonetheless in the meantime I do think the cast could be of benefit. 10/12/18 on evaluation today patient appears to be doing rather well in regard to her toe ulcer. It's just been a few days and already this is significantly improved both as far as overall appearance and size. Fortunately there's no sign of infection. She is here for her first obligatory cast change. 10/19/18 Seen today for follow up and management of left great toe ulcer. Wound continues to show improvement. Noted small open area with seroussang drainage with palpation. Denies any increased pain or recent fevers during visit. She will continue calcium alginate with offloading shoe. Denies any questions or concerns during visit. 10/26/18 on evaluation today patient appears to be doing about the same as when I last saw her in regard to her wound bed. Fortunately there does not appear to be any signs of infection. Unfortunately she continues to  have a breakdown in regard to the toe region any time that she is not in the cast. It takes almost no time at all for this to happen. Nonetheless she still has not heard anything from the brace being made by [employer] as to when exactly this will be available to her. Fortunately there is no signs of infection at this time. 10/30/18 on evaluation today patient presents for application of the total contact cast as we just received him this morning. Fortunately we are gonna be able to apply this to her today which is great news. She continues to have no significant pain which is good news. Overall I do feel like things have been improving while she was the cast is when she doesn't have a cast that things get worse. She still has not really heard anything from [employer] regarding her brace. 11/02/18 upon evaluation today patient's wound already appears to be doing significantly better which is good news. Fortunately there does not appear to be any signs of infection also good news. Overall I do think the total contact cast as before is helping to heal this area unfortunately it's just not gonna likely keep the area closed and healed without her getting her brace at least. Again the foot drop is a significant issue for her. 11/09/18 on evaluation today patient appears to be doing excellent in regard to her toe ulcer which in fact is completely healed. Fortunately we finally got the situation squared away with the paperwork which was needed to proceed with getting her brace approved by Medicaid. I have filled that out unfortunately that information has been sent to the orthopedic office that I worked at 2 1/2 years ago and not tired Current wound care measures. Fortunately she seems to be doing very well at this time. 11/23/18 on evaluation today patient appears to be doing More Poorly Compared to Last Time I Saw Her. At [facility] She Had Completely Healed. Currently she is continuing to have issues with reopening.  She states that she just found out that the brace was approved through Medicaid now she just has to go get measured in order to have this fitted for her and then made. Subsequently she  does not have an appointment for this yet that is going to complicate things we obviously cannot put her back in the cast if we do not have everything measured because they're not gonna be able to measure her foot while she is in the cast. 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 Obviously this is unfortunate and does have me somewhat worried as well. 11/30/18 on evaluation today patient's toe ulcer actually appears to be doing fairly well. The good news is she will be getting her brace in the shoes next week on Wednesday. Hopefully we will be able to get this to heal without having to go back in the cast however she may need the cast in order to get the wound completely heal and then go from there. Fortunately there's no signs of infection at this time. 12/07/18 on evaluation today patient fortunately did receive her brace and she states she could tell this definitely makes her walk better. With that being said she's been having issues with her toe where she noticed yesterday there was a lot of tissue that was loosing off this appears to be much larger than what it was previous. She also states that her leg has been read putting much across the top of her foot just about the ankle although this seems to be receiving somewhat. The total area is still red and appears to be someone infected as best I can tell. She is previously taken Bactrim and that may be a good option for her today as well. We are gonna see what I wound culture shows as well and I think that this is definitely appropriate. With that being said outside of the culture I still need to initiate something in the interim and that's what I'm gonna go ahead and  select Bactrim is a good option for her. 12/14/18 on evaluation today patient appears to be doing better in regard to her left great toe ulcer as compared to last week's evaluation. There's still some erythema although this is significantly improved which is excellent news. Overall I do believe that she is making good progress is still gonna take some time before she is where I would like her to be from the standpoint of being able to place her back into the total contact cast. Hopefully we will be where we need to be by next week. 12/21/18 on evaluation today patient actually appears to be doing poorly in regard to her toe ulcer. She's been tolerating the dressing changes without complication. Fortunately there's no signs of systemic infection although she does have a lot of drainage from the toe ulcer and this does seem to be causing some issues at this point. She does have erythema on the distal portion of her toe that appears to be likely cellulitis. 12/28/18 on evaluation today patient actually appears to be doing a little better in my pinion in regard to her toe ulcer. With that being said she still does have some evidence of infection at this time and for her culture she had both E. coli as well as enterococcus as organisms noted on evaluation. For that reason I think that though the Keflex likely has treated the E. coli rather well this has really done nothing for the enterococcus. We are going to have to initiate treatment for this specifically. 01/04/19 on evaluation today patient's toe actually appears to be doing  better from the standpoint of infection. She currently would like to see about putting the cash back on I think that this is appropriate as long as she takes care of it and keeps it from getting wet. She is gonna have some drainage we can definitely pass this up with Drawtex and alginate to try to prevent as much drainage as possible from causing the problems. With that being said I do  want to at least try her with the cast between now and Tuesday. If there any issues we can't continue to use it then I will discontinue the use of the cast at that point. 01/08/19 on evaluation today patient actually appears to be doing very well as far as her foot ulcer specifically the great toe on the left is concerned. She did have an area of rubbing on the medial aspect of her left ankle which again is from the cast. Fortunately there's no signs of infection at this point in this appears to be a very slight skin breakdown. The patient tells me she felt it rubbing but didn't think it was that bad. Fortunately there is no signs of active infection at this time which is good news. No fevers, chills, nausea, or vomiting noted at this time. 01/15/19 on evaluation today patient actually appears to be doing well in regard to her toe ulcer. Again as previous she seems to do well and she has the cast on which indicates to me that during the time she doesn't have a cast on she's putting way too much pressure on this region. Obviously I think that's gonna be an issue as with the current national emergency concerning the Covid-19 Virus it has been recommended that we discontinue the use of total contact casting by the chief medical officer of our company, Dr. Maurine Minister. The reasoning is that if a patient becomes sick and cannot come into have the cast removed they could Kalka, Anay A ([id number]) [phone].pdf Page 5 of 21 not just leave this on for an additional two weeks. Obviously the hospitals also do not want to receive patient's who are sick into the emergency department to potentially contaminate the region and spread the Covid-19 Virus among other sick individuals within the hospital system. Therefore at this point we are suspending the use of total contact cast until the current emergency subsides. This was all discussed with the patient today as well. 01/22/19 on evaluation  today patient's wound on her left great toe appears to be doing slightly worse than previously noted last week. She tells me that she has been on this quite a bit in fact she tells me she's been awake for 38 straight hours. This is due to the fact that she's having to care for grandparents because nobody else will. She has been taking care of them for five the last seven days since I've seen her they both have dementia his is from a stroke and her grandmother's was progressive. Nonetheless she states even her mom who knows her condition and situation has only help two of those days to take care of them she's been taking care of the rest. Fortunately there does not appear to be any signs of active infection in regard to her toe at this point although obviously it doesn't look as good as it did previous. I think this is directly related to her not taking off the pressure and friction by way of taking things easy. Though I completely understand what's going on. 01/29/19 on evaluation  today patient's tools are actually appears to be showing some signs of improvement today compared to last week's evaluation as far as not necessarily the overall size of the wound but the fact that she has some new skin growth in between the two ends of the wound opening. Overall I feel like she has done well she states that she had a family member give her what sounds to be a CAM walker boot which has been helpful as well. 02/05/19 on evaluation today patient's wound bed actually appears to be doing significantly better in regard to her overall appearance of the size of the wound. With that being said she is still having an issue with offloading efficiently enough to get this to close. Apparently there is some signs of infection at this point as well unfortunately. Previously she's done well of Augmentin I really do not see anything that needs to be culture currently but there theme and cellulitis of the foot that I'm seeing I'm  gonna go ahead and place her on an antibiotic today to try to help clear this up. 02/12/2019 on evaluation today patient actually appears to be doing poorly in regard to her overall wound status. She tells me she has been using her offloading shoe but actually comes in today wearing her tennis shoe with the AFO brace. Again as I previously discussed with her this is really not sufficient to allow the area to heal appropriately. Nonetheless she continues to be somewhat noncompliant and I do wonder based on what she has told my nurse in the past as to whether or not a good portion of this noncompliance may be recreational drug and alcohol related. She has had a history of heroin overdose and this was fairly recently in the past couple of months that have been seeing her. Nonetheless overall I feel like her wound looks significantly worse today compared to what it was previous. She still has significant erythema despite the Augmentin I am not sure that this is an appropriate medication for her infection I am also concerned that the infection may have gone down into her bone. 02/19/19 on evaluation today patient actually appears to be doing about the same in regard to her toe ulcer. Unfortunately she continues to show signs of bone exposure and infection at this point. There does not appear to be any evidence of worsening of the infection but I'm also not really sure that it's getting significantly better. She is on the Augmentin which should be sufficient for the Staphylococcus aureus infection that she has at this point. With that being said she may need IV antibiotics to more appropriately treat this. We did have a discussion today about hyperbaric option therapy. 02/28/19 on evaluation today patient actually appears to be doing much worse in regard to the wound on her left great toe as compared to even my previous evaluation last week. Unfortunately this seems to be training in a pretty poor direction. Her  toe was actually now starting to angle laterally and I can actually see the entire joint area of the proximal portion of the digit where is the distal portion of the digit again is no longer even in contact with the joint line. Unfortunately there's a lot more necrotic tissue around the edge and the toe appears to be showing signs of becoming gangrenous in my pinion. I'm very concerned about were things stand at this point. She did see infectious disease and they are planning to send in a prescription for  Sivextro for her and apparently this has been approved. With that being said I don't think she should avoid taking this but at the same time I'm not sure that it's gonna be sufficient to save her toe at this point. 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 03/05/19 on evaluation today patient actually appears to be doing quite well in regard to her toe all things considering. She still has bone exposed but there appears to be much less your thing on overall the appearance of the wound and the toe itself is dramatically improved. She still does have some issues currently obviously with infection she did see vascular as well and there concerned that her blood flow to the toad. For that reason they are setting up for an angiogram next week. 03/14/19 on evaluation today patient appears to be doing very poor in regard to her toe and specifically in regard to the ulceration and the fact that she's starting to notice the toe was leaning even more towards the lateral aspect and the complete joint is visible on the proximal aspect of the joint. Nonetheless she's also noted a significant odor and the tip of the toe is turning more dark and necrotic appearing. Overall I think she is getting worse not better as  far as this is concerned. For that reason I am recommending at this point that she likely needs to be seen for likely amputation. READMISSION 03/19/2021 This is a patient that we cared for in this clinic for a prolonged period of time in 2019 and 2020 with a left foot and left first toe wound. 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 Excell Seltzer. In December 2021 she had an ulcer on her right great toe as well as the fourth and fifth toes. 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 She also had an angiogram at that time and underwent angioplasty of the right anterior tibial artery. In any case she claims that the wound on the right foot is closed I did not look at this today which was probably an oversight although I think that should be done next week. After her surgery she developed a dehiscence but I do not see any follow-up of this. According to Dr. Bernette Redbird last review that she was out of the area being cared for by another physician but recently came back to his attention. 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 A culture of this area showed E. coli apparently before she came back to see Dr. Excell Seltzer she was supposed to be receiving antibiotics but she did not really take them. Nor is she offloading this area at all. Finally her last hemoglobin A1c listed in epic was in March 2022 at 14.1 she says things are a lot better since then although I am not sure. 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 She had this wound at the time. During this admission she had E. coli sepsis a TEE was negative. Past medical history is extensive and includes type 2 diabetes with peripheral neuropathy cardiomyopathy with an ejection  fraction of 33%,  hypertension, hyperlipidemia chronic renal failure stage III history of substance abuse with cocaine although she claims to be clean now verified by her mother. She is still a heavy cigarette smoker. She has a history of bipolar disorder seizure disorder ABI in our clinic was 1.05 6/1; left midfoot in the setting of a TMA done previously. Round circular wound with a "knuckle" of protruding tissue. The problem is that the knuckle was not attached to any of the surrounding granulation and this probed proximally widely I removed a large portion of this tissue. This wound goes with considerable undermining laterally. I do not feel any bone there was no purulence but this is a deep wound. 6/8; in spite of the debridement I did last week. She arrives with a wound looking exactly the same. A protruding "knuckle" of tissue nonadherent to most of the surrounding tissue. There is considerable depth around this from 6-12 o'clock at 2.7 cm and undermining of 1 cm. This does not look overtly infected and the x- ray I did last week was negative for any osseous abnormalities. We have been using silver collagen 6/15; deep tissue culture I did last week showed moderate staph aureus and moderate Pseudomonas. This will definitely require prolonged antibiotic therapy. The pathology on the protuberant area was negative for malignancy fungus etc. 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 We have been using silver collagen. I am going to be prescribing Levaquin for 2 weeks. Her CT scan of the foot is down for 7/5 6/22; CT scan of the foot on 7 5. She says she has hardware in the left leg from her previous fracture. She is on the Levaquin for the deep tissue culture I did that showed methicillin sensitive staph aureus and Pseudomonas. I gave her a 2-week supply and she will have another week. She arrives in clinic today with the same protuberant tissue however this  is nonadherent to the tissue surrounding it. I am really at a loss to explain this unless there is underlying deep tissue infection 6/29; patient presents for 1 week follow-up. She has been using collagen to the wound bed. She reports taking her antibiotics as prescribed.She has no complaints or issues today. She denies signs of infection. 7/6; patient presents for one week followup. She has been using collagen to the wound bed. She states she is taking Levaquin however at times she is not able KYLYN, BENKO A ([id number]) [id number] Page 6 of 21 to keep it down. She denies signs of infection. 7/13; patient presents for 1 week follow-up. She has been using silver alginate to the wound bed. She still has nausea when taking her antibiotics. She denies signs of infection. 7/20; patient presents for 1 week follow-up. She has been using silver alginate with gentamicin cream to the wound bed. She denies any issues and has no complaints today. She denies signs of infection. 7/27; patient presents for 1 week follow-up. She continues to use silver alginate with gentamicin cream to the wound bed. She reports starting her antibiotics. She has no issues or complaints. Overall she reports stability to the wound. 8/3; patient presents for 1 week follow-up. She has been using silver alginate with gentamicin cream to the wound bed. She reports completing all antibiotics. She has no issues or complaints today. She denies signs of infection. 8/17; patient presents for 2-week follow-up. He is to use silver alginate to the wound bed. She has no issues or  complaints today. She denies signs of infection. She reports her pain has improved in her foot since last clinic visit 8/24; patient presents for 1 week follow-up. She continues to use silver alginate to the wound bed. She has no issues or complaints. She denies signs of infection. Pain is stable. 9/7; patient presents for follow-up. She  missed her last week appointment due to feeling ill. She continues to use silver alginate. She has a new wound to the right lower extremity that is covered in eschar. She states It occurred over the past week and has no idea how it started. She currently denies signs of infection. 9/14; patient presents for follow-up. T the left foot wound she has been using gentamicin cream and silver alginate. T the right lower extremity wound she has o o been keeping this covered and has not obtain Santyl. 9/21; patient presents for follow-up. She reports using gentamicin cream and silver alginate to the left foot and Santyl to the right lower extremity wound. She has no issues or complaints today. She denies signs of infection. 9/28; patient presents for follow-up. She reports a new wound to her right heel. She states this occurred a few days ago and is progressively gotten worse. She has been trying to clean the area with a Q-tip and Santyl. She reports stability in the other 2 wounds. She has been using gentamicin cream and silver alginate to the left foot and Santyl to the right lower extremity wound. 10/12; patient presents for follow-up. She reports improvement to the wound beds. She is seeing vein and vascular to discuss the potential of a left BKA. She states they are going to do an arteriogram. She continues to use silver alginate with dressing changes to her wounds. 11/2; patient presents for follow-up. She states she has not been doing dressing changes to the wound beds. She states she is not able to offload the areas. She reports chronic pain to her left foot wound. 11/9; patient presents for follow-up. She came in with only socks on. She states she forgot to put on shoes. It is unclear if she is doing any dressing changes. She currently denies systemic signs of infection. 11/16; patient presents for follow-up. She came again only with socks on. She states she does not wear shoes ever. It is unclear  if she does dressing changes. She currently denies systemic signs of infection. 11/23; patient presents for follow-up. She wore her shoes today. It still unclear exactly what dressing she is using for each wound but she did states she obtained Dakin's solution and has been using this to the left foot wound. She currently denies signs of infection. 11/30; patient presents for follow-up. She has no issues or complaints today. She currently denies signs of infection. 12/7; patient presents for follow-up. She has no issues or complaints today. She has been using Hydrofera Blue to the right heel wound and Dakin solution to the left foot wound. Her right anterior leg wound is healed. She currently denies signs of infection. 12/14; patient presents for follow-up. She has been using Hydrofera Blue to the right heel and Dakin's to the left foot wounds. She has no issues or complaints today. She denies signs of infection. 12/21; patient presents for follow-up. She reports using Hydrofera Blue to the right heel and Dakin's to the left foot wound. She denies signs of infection. 12/28; patient presents for follow-up. She continues to use Dakin's to the left foot wound and Hydrofera Blue to the right heel  wound. She denies signs of infection. 1/4; patient presents for follow-up. She has no issues or complaints today. She denies signs of infection. 1/11; patient presents for follow-up. It is unclear if she has been dressing these wounds over the past week. She currently denies signs of infection. 1/18; patient presents for follow-up. She states she has been using Dakin's wet-to-dry dressings to the left foot. She has been using Hydrofera Blue to the right foot foot wound. She states that the anterior right leg wound has reopened and draining serous fluid. She denies signs of infection. 1/25; patient presents for follow-up. She has no issues or complaints today. 2/1; patient presents for follow-up. She has no  issues or complaints today. She denies signs of infection. 2/8; patient presents for follow-up. She has lost her surgical shoes. She did not have a dressing to the right heel wound. She currently denies signs of infection. 2/15; patient presents for follow-up. She reports more pain to the right heel today. She denies purulent drainage Or fever/chills 2/22; patient presents for follow-up. She reports taking clindamycin over the past week. She states that she continues to have pain to her right heel. She reports purulent drainage. Readmission 03/16/2022 Ms. Matoya Laurila is a 47 year old female with a past medical history of type 2 diabetes, osteomyelitis to her feet, chronic systolic heart failure and bipolar disorder that presents to the clinic for bilateral feet wounds and right lower extremity wound. She was last seen in our clinic on 12/15/2021. At that time she had purulent drainage coming out of her right plantar foot and I recommended she go to the ED. She states she went to [facility] and has been there for the past 3 months. I cannot see the records. She states she had OR debridement and was on several weeks of IV antibiotics while inpatient. Since discharge she has not been taking care of the wound beds. She had nothing on her feet other than socks today. She currently denies signs of infection. 5/31; patient presents for follow-up. She has been using Dakin's wet-to-dry dressings to the wound beds on her feet bilaterally and antibiotic ointment to the right anterior leg wound. She had a wound culture done at last clinic visit that showed moderate Pseudomonas aeruginosa sensitive to ciprofloxacin. She currently denies systemic signs of infection. 6/14; patient presents for follow-up. She received Keystone 5 days ago and has been using this on the wound beds. She states that last week she had to go to the hospital because she had increased warmth and erythema to the right foot. She  was started on 2 oral antibiotics. She states she has been taking these. She currently denies systemic signs of infection. She has no issues or complaints today. 6/21; patient presents for follow-up. She states she has been using Keystone antibiotics to the wound beds. She has no issues or complaints today. She denies signs of infection. 6/28; patient presents for follow-up. She has been using Keystone antibiotics to the wound beds. She has no issues or complaints today. 7/12; patient presents for follow-up. Has been using Keystone antibiotics to the wound beds with calcium alginate. She has no issues or complaints today. She ADIELLA, LAFLASH A ([id number]) [id number] Page 7 of 21 never followed up with her orthopedic surgeon who did the OR debridement to the right foot. We discussed the total contact cast for the left foot and patient would like to do this next week. 7/19; patient presents for follow-up. She has been using [address]  antibiotics with calcium alginate to the wound beds. She has no issues or complaints today. Patient is in agreement to do the total contact cast of the left foot today. She knows to return later this week for the obligatory cast change. 05-13-2022 upon evaluation today patient's wound which she has the cast of the left leg actually appears to be doing significantly better. Fortunately I do not see any signs of active infection locally or systemically which is great news and overall I am extremely pleased with where we stand currently. 7/26; patient presents for follow-up. She has a cast in place for the past week. She states it irritated her shin. Other than that she tolerated the cast well. She states she would like a break for 1 week from the cast. We have been using Keystone antibiotic and Aquacel to both wound beds. She denies signs of infection. 8/2; patient presents for follow-up. She has been using Keystone and Aquacel to the wound beds.  She denies any issues and has no complaints. She is agreeable to have the cast placed today for the left leg. 06-03-2022 upon evaluation today patient appears to be doing well with regard to her wound she saw some signs of improvement which is great news. Fortunately I do not see any evidence of active infection locally or systemically at this time which is great news. No fevers, chills, nausea, vomiting, or diarrhea. 8/16; patient presents for follow-up. She has no issues or complaints today. We have been using Keystone and Aquacel to the wound beds. The left lower extremity is in a total contact cast. She is tolerated this well. 8/23; patient presents for follow-up. She has had the total contact cast on the left leg for the past week. Unfortunately this has rubbed and broken down the skin to the medial foot. She currently denies signs of infection. She has been using Keystone antibiotic to the right plantar foot wound. 8/30; patient presents for follow-up. We have held off on the total contact cast for the left leg for the past week. Her wound on the left foot has improved and the previous surrounding breakdown of skin has epithelialized. She has been using Keystone antibiotic to both wound beds. She has no issues or complaints today. She denies signs of infection. 9/6; patient presents for follow-up. She has ordered her's Keystone antibiotic and this is arriving this week. She has been doing Dakin's wet-to-dry dressings to the wound beds. She denies signs of infection. She is agreeable to the total contact cast today. 9/13; patient presents for follow-up. She states that the cast caused her left leg shin to be sore. She would like to take a break from the cast this week. She has been using Keystone antibiotic to the right plantar foot wound. She denies signs of infection. 9/20; patient presents for follow-up. She has been using Keystone antibiotics to the wound beds with calcium alginate to the  right foot wound and Hydrofera Blue to the left foot wound. She is agreeable to having the cast placed today. She has been approved for Apligraf and we will order this for next clinic visit. 9/27; patient presents for follow-up. We have been using Keystone antibiotic with Hydrofera Blue to the left foot wound under a total contact cast. T the right o foot wound she has been using Keystone antibiotic and calcium alginate. She declines a total contact cast today. Apligraf is available for placement and she would like to proceed with this. 07-28-2022 upon evaluation today patient  appears to be doing well currently in regard to her wound. She is actually showing signs of significant improvement which is great news. Fortunately I do not see any evidence of active infection locally nor systemically at this time. She has been seeing Dr. Mikey Bussing and to be honest has been doing very well with the cast. Subsequently she comes in today with a cast on and we did reapply that today as well. She did not really want to she try to talk me out of that but I explained that if she wanted to heal this is really the right way to go. Patient voiced understanding. In regard to her right foot this is actually a lot better compared to the last time I saw her which is also great news. 10/11; patient presents for follow-up. Apligraf and the total contact cast was placed to the left leg at last clinic visit. She states that her right foot wound had burning pain to it with the placement of Apligraf to this area. She has been doing Brave over this area. She denies signs of infection including increased warmth, erythema or purulent drainage. 11/1; 3-week follow-up. The patient fortunately did not have a total contact cast or an Apligraf and on the left foot. She has been using Keystone ABD pads and kerlix and her own running shoes She arrives in clinic today with thick callus and a very poor surface on the left foot on the right  nonviable skin subcutaneous tissue and a deep probing hole. 11/15; patient missed her last clinic appointment. She states she has not been dressing the wound beds for the past 2 weeks. She states that at she had a new roommate but is now going back to live with her mother. Apparently its been a distracting 2 weeks. Patient currently denies signs of infection. 11/22; patient presents for follow-up. She states she has been using Keystone antibiotic and Dakin's wet-to-dry dressings to the wound beds. She is agreeable for cast placement today. We had ordered Apligraf however this has not been received by our facility. 11/29; Patient had a total contact cast placed at last clinic visit and she tolerated this well. We were using silver alginate under the cast. Patient's been using Keystone antibiotic with Aquacel to the right plantar foot wound. She has no issues or complaints today. Apligraf is available for placement today. Patient would like to proceed with this. 12/6; patient presents for follow-up. She had Apligraf placed in standard fashion last clinic visit under the total contact cast to the left lower extremity. She has been using Keystone antibiotic and Aquacel to the right plantar foot wound. She has no issues or complaints today. 12/13; patient presents for follow-up. She has finished 5 Apligraf placements. Was told she would not qualify for more. We have been doing a total contact cast to the left lower extremity. She has been using Keystone antibiotic and Aquacel to the right plantar foot wound. She has no issues or complaints today. 12/20; patient presents for follow-up. We have been using Hydrofera Blue with Keystone antibiotic under a total contact cast of the left lower extremity. She reports using Keystone antibiotic and silver alginate to the right heel wound. She has no issues or complaints today. 12/27; patient presents with a healthy wound on the left midfoot. We have Apligraf to apply  that to that more also using a total contact cast. On the right we are using Keystone and silver alginate. She is offloading the right heel with a  surgical shoealthough by her admission she is on her feet quite a bit 1/3; patient presents for follow-up. Apligraf was placed to the wound beds last clinic visit. She was placed in a total contact cast to the left lower extremity. She declines a total contact cast today. She states that her mother is in the hospital and she cannot adequately get around with the cast on. 1/10; patient presents for follow-up. She declined the total contact cast at last clinic visit. Both wounds have declined in appearance. She states that she has been on her feet and not offloading the wound beds. She currently denies signs of infection. 1/17; patient presents for follow-up. She had the total contact cast along with Apligraf placed last week to the left lower extremity. She tolerated this well. She has been using Aquacel Ag and Keystone antibiotic to the right heel wound. She currently denies signs of infection. She has no issues or complaints today. 1/24; patient presents for follow-up. We have been using Apligraf to the left foot wound along with a total contact cast. She has done well with this. T the right o heel wound she has been using Aquacel Ag and Keystone antibiotic ointment. She has no issues or complaints today. She denies signs of infection. LYNDIN, LOADER A ([id number]) [id number] Page 8 of 21 1/31; patient presents for follow-up. We have been using Apligraf to the left foot wound along with the total contact cast. She continues to do well with this. To the right heel we have been using Aquacel Ag and Keystone antibiotic ointment. She has no issues or complaints today. 12-01-2022 upon evaluation patient is seen today on my schedule due to the fact that she unfortunately was in the hospital yesterday. Her cast needed to come off the  only reason she is out of the hospital is due to the fact that they would not take it off at the hospital which is somewhat bewildered reading to me to be perfectly honest. I am not certain why this was but either way she was released and then was placed on my schedule today in order to get this off and reapply the total contact casting as appropriate. I do not have an Apligraf for her today it was applied last week and today's actually expired yesterday as there was some scheduling conflicts with her being in the hospital. Nonetheless we do not have that for reapplication today but the good news that she is not draining too much and the Apligraf can go for up to 2 weeks so I am going to go ahead and reapply the total contact casting but we are going to leave the Apligraf in place. 2/14; patient presents for follow-up. T the left leg she has had the total contact cast and Apligraf for the past week. She has had no issues with this. T the o o right heel she has been using antibiotic ointment and Aquacel Ag. 2/21; patient presents for follow-up. We have been using Apligraf and a total contact cast to the left lower extremity. She is tolerated this well. Unfortunately she is not approved for any more Apligraf per insurance. She has been using antibiotic ointment and Aquacel Ag to the right foot. She has no issues or complaints today. 2/28; patient presents for follow-up. We have been using Hydrofera Blue and antibiotic ointment under the total contact cast to the left lower extremity. He has been using Aquacel Ag with antibiotic ointment to the right plantar foot. She has no issues  or complaints today. 3/6; patient presents for follow-up. She did not obtain her gentamicin ointment. She has been using Aquacel Ag to the right plantar foot wound. We have been using Hydrofera Blue with antibiotic ointment under the total contact cast to the left lower extremity. She has no issues or complaints today. 3/12;  patient presents for follow-up. She has been using gentamicin ointment and Aquacel Ag to the right plantar foot wound. We have been using Hydrofera Blue with antibiotic ointment under the total contact cast on the left lower extremity foot wound. She has no issues or complaints today. 3/20; patient presents for follow-up. She has been using gentamicin ointment and Aquacel Ag to the right plantar foot wound. We have been using Hydrofera Blue with antibiotic ointment under the total contact cast to the left lower extremity wound. She has no issues or complaints today. 3/27; patient presents for follow-up. We have been using antibiotic ointment and Aquacel Ag to the right plantar foot wound. We have been using Hydrofera Blue with antibiotic ointment under the total contact cast to the left lower extremity. Both wounds are smaller. 01-24-2023 upon evaluation today patient actually appears to be doing better compared to last time I saw her. It has been several weeks since I last saw her but nonetheless I think the total contact casting is doing a good job. Fortunately I do not see any evidence of infection or worsening overall which is great news and in general I do believe that she is moving in the appropriate direction. It has been a very long road but nonetheless I feel like she is nearing the end at least in regard to the left foot and even the right foot looks much better to be perfectly honest. 01-31-2023 upon evaluation today patient appears to be doing well currently in regard to her wounds. She has been require some sharp debridement which I think is probably bone both sides to remove a lot of callus that has built up at this point. I discussed that with her today. Also think total contact cast is doing well for the left foot Emina continue that as well. 4/17; patient presents for follow-up. We have been doing Hydrofera Blue with antibiotic ointment under the total contact cast to the left lower  extremity and Aquacel Ag with antibiotic ointment to the right foot wound. She has been using her offloading heel shoe here. 4/24; patient presents for follow-up. We have been doing Hydrofera Blue with antibiotic ointment under the total contact cast to the left lower extremity and Aquacel Ag with antibiotic ointment to the right foot wound. She has been using her offloading heel shoe here. Wounds are smaller with slough accumulation. 5/1; patient presents for follow-up. We have been doing Hydrofera Blue with antibiotic ointment under the total contact cast to the left lower extremity and Aquacel Ag with antibiotic ointment to the right foot wound. Left foot wound is stable and right foot wound appears smaller. 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 On the left she is using Hydrofera Blue topical antibiotic under a total contact cast, on the right plantar heel gentamicin Aquacel Ag. According to our intake nurse both are making slow but steady improvements. 5/15; patient presents for follow-up. We have been doing Hydrofera Blue with antibiotic ointment under the total contact cast on the left and antibiotic ointment with Aquacel Ag on the right. Unfortunately the left foot wound has declined in size and appearance. We also do not have the total contact  cast available in office today. Patient denies signs of infection. 5/22; patient presents for follow-up. We have been doing Hydrofera Blue and antibiotic ointment to the left foot and Aquacel Ag with antibiotic ointment to the right foot. We took a break from the cast since we did not have it placed at last week. Wounds look stable if not slightly improved. 03-27-2023 patient presents today she has had this For Most 2 Weeks Because She Was in the Hospital and They Did Not T It off. Again That Is Really Not ake Something That She Could Control but Nonetheless We Are Seeing Her at This Point Today for Reevaluation. We Do Want to Go and See about  Reapply the Cast. Again We Had All Set Everything up to Go Ahead and Do This When We Went to Get the Cast Only to Realize That Not Had Come in and We Were out at the Moment. With That Being [place] Spaniel We Will Get Have T Get the Exam before We Can Reapply the T Contact Casting Going Forward. o otal 6/12; patient presents for follow-up. She has been using Hydrofera Blue to the left foot wound and Aquacel Ag to the right foot wound. No cast was available last week placed on the patient so she has been offloading with her surgical shoe. She has no issues or complaints today. 6/19; patient presents for follow-up. We have been using Hydrofera Blue to the left foot wound under the total contact cast and Aquacel Ag to the right wound. She has no issues or complaints today. The wounds are smaller. 6/26; patient presents for follow-up. We have been using Hydrofera Blue to the left foot wound under the total contact cast and Aquacel Ag to the right foot wound. Wounds are smaller. Patient has no issues or complaints today. 7/3; patient presents for follow-up. We have been using Hydrofera Blue to the left foot wound under the total contact cast and Aquacel Ag to the right foot wound. Wounds Appear smaller. No signs of infection. 7/10; patient presents for follow-up. We have been using Hydrofera Blue to the left foot wound under the total contact cast and Aquacel Ag to the right foot wound. Left foot wound is smaller. Right foot wound is stable. 7/17; patient presents for follow-up. We have been using Hydrofera Blue to the left foot under the total contact cast and Aquacel Ag to the right foot wound. She has no issues or complaints today. 7/24; patient presents for follow-up. We have been using Hydrofera Blue to the left foot under the total contact cast and Aquacel Ag to the right foot wound. Unfortunately she was bit by her dog over the weekend and developed a wound to the right lower leg. She has been keeping the  area covered. 7/31; patient presents for follow-up. We have been using Hydrofera Blue to the left foot under the total contact cast and Aquacel Ag to the right foot wound and mupirocin ointment to the right anterior leg wound. She started her oral antibiotics 2 days ago. 8/7; patient presents for follow-up. We have been using Hydrofera Blue and antibiotic ointment to the left foot under the total contact cast and Aquacel Ag to the right foot wound. Antibiotic ointment to the right anterior leg wound. She completed her oral antibiotics. She has no issues or complaints today. Wounds continue to improve albeit slowly. ARINA, CHECCHI A ([id number]) [id number] Page 9 of 21 8/14; 8/14; patient has 3 wounds 1 on the left plantar heel 1 on the  right plantar heel and a small wound on the right anterior lower leg. We have been using Hydrofera Blue in the left heel Aquacel on the right heel. The left heel has been in a total contact cast 8/21; patient presents for follow-up. We have been using Hydrofera Blue to the left foot wound under the total contact cast. T the right heel patient has been o using Aquacel Ag. T the anterior leg wound antibiotic ointment. All wounds have healed. o 8/28; patient presents for follow-up. She has been using her surgical shoe with offloading felt pads. Her wounds have remained closed. 9/11; patient presents for final follow-up to assure that her wounds have remained closed. Unfortunately the left foot wound has opened again. But she has been wearing her Surgical shoe for offloading with felt pad. 07-14-2023 upon evaluation today patient appears to be doing well currently in regard to her wound. She has been tolerating the dressing changes without complication with total contact casting. I am going to be helping take care of her at this point as she is transferring from Dr. Mikey Bussing to me since Dr. Mikey Bussing is on maternity leave. 07-24-2023 upon  evaluation today patient appears to be doing decently well currently in regard to her wound. She is actually showing signs of improvement in regard to the left foot and I feel like this is much better than last week no debridement even necessary today. We may have to perform some slight debridement come next week we will see how things go. Monitor for any signs of worsening overall and obviously if anything changes she should be letting us know. 08-01-2023 upon evaluation today patient appears to be doing excellent in regard to her feet. The right foot/heel area is completely healed she does have some callus buildup I am going to trim this down a little bit I do not want to build up to the point of cracking she is doing well with the urea cream. With regard to the left heel she is doing well with casting I am going to perform some debridement here to clear away before reapplying the cast and I think she is really doing quite well though. 08-07-2023 upon evaluation patient's wound actually showed signs of excellent improvement and actually very pleased with where we stand I do believe that she is making good headway towards complete closure which is great news. 08-14-2023 upon evaluation today patient appears to be doing well currently in regard to her wound. She actually is going require a little bit of debridement but fortunately nothing too significant today actually feel like she is doing much better. This is mainly to remove callus on the little slough and biofilm down to good subcutaneous tissue. In general I think that she is making excellent headway here. 08-21-2023 upon evaluation today patient appears to be doing well currently in regard to her wound which is actually showing signs of significant improvement. I am extremely pleased with where things stand I do not see any signs of infection at this time which is great news and overall I do think that we are making headway towards complete  closure which is also excellent news. 08-30-2023 upon evaluation today patient's wound is showing signs of continued improvement. Fortunately I do not see any evidence of worsening overall and I believe that the patient is making excellent headway towards closure. I am extremely pleased with where we stand. 09-04-2023 upon evaluation today patient appears to be doing well currently in regard to her foot  ulcer. She has been tolerating the dressing changes without complication. Fortunately I do not see any signs of active infection locally or systemically at this time which is great news. No fevers, chills, nausea, vomiting, or diarrhea. 09-11-2023 upon evaluation today patient appears to be doing well currently in regard to her wound on the left foot. I feel like she is actually showing signs of improvement I feel like the collagen is doing a good job. With that being said unfortunately she has an area where there is a spot on the right foot that has not really open but at least there looks like there may have been something that popped off of this area. 09-18-2023 upon evaluation today patient appears to be doing well currently in regard to her wound although to be honest she still continues to have issues here with the wound not healing as rapidly as I would expect. She does a lot of walking on the cast from what I am hearing today and to be honest as I mention to her previously this cast is not exactly appropriate for her based on the size of her leg is technically too small and I think this is causing issues with rubbing and therefore callus buildup that would not otherwise occur with a properly fitting cast. I discussed that with her today and I think that potentially having a offloading shoe would be better specifically a front offloading boot. 09-25-2023 upon evaluation patient's wound is showing signs of improvement which is great news and in general I do feel like that she is really doing quite  well. I do not see any signs of active infection at this time which is excellent and in general I think that we are moving in the right direction here. She did get the offloading boot off of [employer] that working to get a try it is a front off loader which I think should do quite well and we will get a see how that does over the next week. Electronic Signature(s) Signed: 09/25/2023 10:47:23 AM By: [name] PA-C Entered By: [name] on 09/25/2023 10:47:22 -------------------------------------------------------------------------------- Physical Exam Details Patient Name: Date of Service: ISSABELLE, SCHENKEL 09/25/2023 9:00 A M Medical Record Number: [id number] Patient Account Number: [id number] Date of Birth/Sex: Treating RN: July 10, 1976 (46 y.o. [name] Primary Care Provider: [name] Other Clinician: [name] Referring Provider: Treating Provider/Extender: [name] in Treatment: [address] [place] A ([id number]) [id number] Page 10 of 21 Constitutional Well-nourished and well-hydrated in no acute distress. Respiratory normal breathing without difficulty. Psychiatric this patient is able to make decisions and demonstrates good insight into disease process. Alert and Oriented x 3. pleasant and cooperative. Notes Upon inspection patient's wound bed actually did require some sharp debridement postdebridement wound bed appears to be doing significantly better which is great news and in general I do feel like that we are really doing quite well as far as healing is concerned. Electronic Signature(s) Signed: 09/25/2023 2:18:52 PM By: [name] PA-C Entered By: [name] on 09/25/2023 14:18:52 -------------------------------------------------------------------------------- Physician Orders Details Patient Name: Date of Service: [name] 09/25/2023 9:00 A M Medical Record Number: [id number] Patient Account  Number: [id number] Date of Birth/Sex: Treating RN: 07-31-1976 (47 y.o. [name] Primary Care Provider: [name] Other Clinician: [name] Referring Provider: Treating Provider/Extender: [name] in Treatment: 51 The following information was scribed by: [name] The information was scribed for: [name] Verbal /  Phone Orders: No Diagnosis Coding Follow-up Appointments Return Appointment in 1 week. Bathing/ Shower/ Hygiene May shower; gently cleanse wound with antibacterial soap, rinse and pat dry prior to dressing wounds No tub bath. Off-Loading Open toe surgical shoe - right foot Other: - front offloading boot left foot Wound Treatment Wound #12R - Foot Wound Laterality: Plantar, Left, Medial Cleanser: Vashe 5.8 (oz) Every Other Day/30 Days Discharge Instructions: Use vashe 5.8 (oz) as directed Prim Dressing: Promogran Matrix 4.34 (in) Every Other Day/30 Days ary Discharge Instructions: Moisten w/normal saline or sterile water and apply into wound bed Secondary Dressing: Gauze Every Other Day/30 Days Discharge Instructions: As directed: dry, moistened with saline or moistened with Dakins Solution Secured With: Medipore T - 70M Medipore H Soft Cloth Surgical T ape ape, 2x2 (in/yd) Every Other Day/30 Days Secured With: Kerlix Roll Sterile or Non-Sterile 6-ply 4.5x4 (yd/yd) Every Other Day/30 Days Discharge Instructions: Apply Kerlix as directed Secured With: [employer] Food Dressing, Latex-free, Size 5, Small-Head / Shoulder / Thigh Every Other Day/30 Days YAHVI, VENKATARAMAN A ([id number]) [phone].pdf Page 11 of 21 Electronic Signature(s) Signed: 09/25/2023 3:39:27 PM By: [name] PA-C Signed: 09/25/2023 4:03:25 PM By: [name] Entered By: [name] on 09/25/2023 10:22:54 -------------------------------------------------------------------------------- Problem List Details Patient Name: Date of  Service: [name] 09/25/2023 9:00 A M Medical Record Number: [id number] Patient Account Number: [id number] Date of Birth/Sex: Treating RN: 1976-02-10 (47 y.o. 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 03/16/2022 No Yes severity E11.42 Type 2 diabetes mellitus with diabetic polyneuropathy 03/16/2022 No Yes Z79.4 Long term (current) use of insulin 02/22/2023 No Yes N18.6 End stage renal disease 07/14/2023 No Yes Z99.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 07/14/2023 No Yes L84 Corns and callosities 08/01/2023 No Yes Inactive Problems Resolved Problems ICD-10 Code Description Active Date Resolved Date L97.512 Non-pressure chronic ulcer of other part of right foot with fat layer exposed 03/16/2022 03/16/2022 L97.811 Non-pressure chronic ulcer of other part of right lower leg limited to breakdown of skin 03/16/2022 03/16/2022 MADDALYN, NASEEM A ([id number]) [phone].pdf Page 12 of 21 [email or web address] Bitten by dog, initial encounter 05/17/2023 05/17/2023 S81.801A Unspecified open wound, right lower leg, initial encounter 05/17/2023 05/17/2023 G26.948 Type 2 diabetes mellitus with other skin ulcer 05/17/2023 05/17/2023 Electronic Signature(s) Signed: 09/25/2023 10:33:36 AM By: [name] PA-C Entered By: [name] on 09/25/2023 10:33:35 -------------------------------------------------------------------------------- Progress Note Details Patient Name: Date of Service: [name] 09/25/2023 9:00 A M Medical Record Number: [id number] Patient Account Number: [id number] Date of Birth/Sex: Treating RN: January 07, 1976 (46  y.o. [name] Primary Care Provider: [name] Other Clinician: [name] Referring Provider: Treating Provider/Extender: [name] in Treatment: 32 Subjective Chief Complaint Information obtained from Patient Left foot ulcer History of Present Illness (HPI) 01/18/18-She is here for initial evaluation of the left great toe ulcer. She is a poor historian in regards to timeframe in detail. She states approximately 4 weeks ago she lacerated her toe on something in the house. She followed up with her primary care who placed her on Bactrim and ultimately a second dose of Bactrim prior to coming to wound clinic. She states she has been treating the toe with peroxide, Betadine and a  Band-Aid. She did not check her blood sugar this morning but checked it yesterday morning it was 327; she is unaware of a recent A1c and there are no current records. She saw Dr. she would've orthopedics last week for an old injury to the left ankle, she states he did not see her toe, nor did she bring it to his attention. She smokes approximately 1 pack cigarettes a day. Her social situation is concerning, she arrives this morning with her mother who appears extremely intoxicated/under the influence; her mother was asked to leave the room and be monitored by the patient's grandmother. The patient's aunt then accompanied the patient and the room throughout the rest of the appointment. We had a lengthy discussion regarding the deleterious effects of uncontrolled hyperglycemia and smoking as it relates to wound healing and overall health. She was strongly encouraged to decrease her smoking and get her diabetes under better control. She states she is currently on a diet and has cut down her [facility] consumption. The left toe is erythematous, macerated and slightly edematous with malodor present. The edema in her left foot is below her baseline, there is no erythema streaking. We will  treat her with Santyl, doxycycline; we have ordered and xray, culture and provided a Peg assist surgical shoe and cultured the wound. 01/25/18-She is here in follow-up evaluation for a left great toe ulcer and presents with an abscess to her suprapubic area. She states her blood sugars remain elevated, feeling "sick" and if levels are below 250, but she is trying. She has made no attempt to decrease her smoking stating that we "can't take away her food in her cigarettes". She has been compliant with offloading using the PEG assist you. She is using Santyl daily. the culture obtained last week grew staph aureus and Enterococcus faecalis; continues on the doxycycline and Augmentin was added on Monday. The suprapubic area has erythema, no femoral variation, purple discoloration, minimal induration, was accessed with a cotton tip applicator with sanguinopurulent drainage, this was cultured, I suspect the current antibiotic treatment will cover and we will not add anything to her current treatment plan. She was advised to go to urgent care or ER with any change in redness, induration or fever. 02/01/18-She is here in follow-up evaluation for left great toe ulcers and a new abdominal abscess from last week. She was able to use packing until earlier this week, where she "forgot it was there". She states she was feeling ill with GI symptoms last week and was not taking her antibiotic. She states her glucose levels have been predominantly less than 200, with occasional levels between 200-250. She thinks this was contributing to her GI symptoms as they have resolved without intervention. There continues to be significant laceration to left toe, otherwise it clinically looks stable/improved. There is now less superficial opening to the lateral aspect of the great toe that was residual blister. We will transition to [facility] to all wounds, she will continue her Augmentin. If there is no change or deterioration  next week for reculture. 02/08/18-She is here in follow-up evaluation for left great toe ulcer and abdominal ulcer. There is an improvement in both wounds. She has been wrapping her left toe with coban, not by our direction, which has created an area of discoloration to the medial aspect; she has been advised to NOT use coban secondary to her neuropathy. She states her glucose levels have been high over this last week ranging from 200-350, she continues  to smoke. She admits to being less compliant with her offloading shoe. We will continue with same treatment plan and she will follow-up next week. 02/15/18-She is here in follow-up evaluation for left great toe ulcer and abdominal ulcer. The abdominal ulcer is epithelialized. The left great toe ulcer is improved and all injury from last week using the Coban wrap is resolved, the lateral ulcer is healed. She admits to noncompliance with wearing offloading shoe and admits to glucose levels being greater than 300 most of the week. She continues to smoke and expresses no desire to quit. There is one area medially that probes deeper than it has historically, erythema to the toe and dorsal foot has consistently waxed and waned. There is no overt signs of cellulitis or infection but we will culture the wound for any occult infection given the new area of depth and erythema. We will hold off on sensitivities for initiation of antibiotic therapy. 02/22/18-She is here in follow up evaluation for left great toe ulcer. There is overall significant improvement in both wound appearance, erythema and edema with changes made last week. She was not initiated on antibiotic therapy. Culture obtained last week showed oxacillin sensitive staph aureus, sensitive to LUN, BREIT A ([id number]) [phone].pdf Page 13 of 21 clindamycin. Clindamycin has been called into the pharmacy but she has been instructed to hold off on initiation secondary to overall  clinical improvement and her history of antibiotic intolerance. She has been instructed to contact the clinic with any noted changes/deterioration and the wound, erythema, edema and/or pain. She will follow-up next week. She continues to smoke and her glucose levels remain elevated >250; she admits to compliance with offloading shoe 03/01/18 on evaluation today patient appears to be doing fairly well in regard to her left first toe ulcer. She has been tolerating the dressing changes with the [facility] Dressing without complication and overall this has definitely showed signs of improvement according to records as well is what the patient tells me today. I'm very pleased in that regard. She is having no pain today 03/08/18 She is here for follow up evaluation of a left great toe ulcer. She remains non-compliant with glucose control and smoking cessation; glucose levels consistently >200. She states that she got new shoe inserts/peg assist. She admits to compliance with offloading. Since my last evaluation there is significant improvement. We will switch to prisma at this time and she will follow up next week. She is noted to be tachycardic at this appointment, heart rate 120s; she has a history of heart rate 70-130 according to our records. She admits to extreme agitation r/t personal issues; she was advised to monitor her heartrate and contact her physician if it does not return to a more normal range (<100). She takes cardizem twice daily. 03/15/18-She is here in follow-up evaluation for left great toe ulcer. She remains noncompliant with glucose control and smoking cessation. She admits to compliance with wearing offloading shoe. The ulcer is improved/stable and we will continue with the same treatment plan and she will follow-up next week 03/22/18-She is here for evaluation for left great toe ulcer. There continues to be significant improvement despite recurrent hyperglycemia (over 500  yesterday) and she continues to smoke. She has been compliant with offloading and we will continue with same treatment plan and she will follow-up next week. 03/29/18-She is here for evaluation for left great toe ulcer. Despite continuing to smoke and uncontrolled diabetes she continues to improve. She is compliant with offloading  shoe. We will continue with the same treatment plan and she will follow-up next week 04/05/18- She is here in follow up evaluation for a left great toe ulcer; she presents with small pustule to left fifth toe (resembles ant bite). She admits to compliance with wearing offloading shoe; continues to smoke or have uncontrolled blood glucose control. There is more callus than usual with evidence of bleeding; she denies known trauma. 04/12/18-She is here for evaluation of left great toe ulcer. Despite noncompliance with glycemic control and smoking she continues to make improvement. She continues to wear offloading shoe. The pustule, that was identified last week, to the left fifth toe is resolved. She will follow-up in 2 weeks 05/03/18-she is seen in follow-up evaluation for a left great toe ulcer. She is compliant with offloading, otherwise noncompliant with glycemic control and smoking. She has plateaued and there is minimal improvement noted. We will transition to [facility], replaced the insert to her surgical shoe and she will follow-up in one week 05/10/18- She is here in follow up evaluation for a left great toe ulcer. It appears stable despite measurement change. We will continue with same treatment plan and follow up next week. 05/24/18-She is seen in follow-up evaluation for a left great toe ulcer. She remains compliant with offloading, has made significant improvement in her diet, decreasing the amount of sugar/soda. She said her recent A1c was 10.9 which is lower than. She did see a diabetic nutritionist/educator yesterday. She continues to smoke. We will continue with  the same treatment plan and she'll follow-up next week. 05/31/18- She is seen in follow-up evaluation for left great toe ulcer. She continues to remain compliant with offloading, continues to make improvement in her diet, increasing her water and decreasing the amount of sugar/soda. She does continue to smoke with no desire to quit. We will apply Prisma to the depth and Hydrofera Blue over. We have not received insurance authorization for oasis. She will follow up next week. 06/07/18-She is seen in follow-up evaluation for left great toe ulcer. It has stalled according to today's measurements although base appears stable. She says she saw a diabetic educator yesterday; her average blood sugars are less than 300 which is an improvement for her. She continues to smoke and states "that's my next step" She continues with water over soda. We will order for xray, culture and reinstate ace wrap compression prior to placing apligraf for next week. She is voicing no complaints or concerns. Her dressing will change to iodoflex over the next week in preparation for apligraf. 06/14/18-She is seen in follow-up evaluation for left great toe ulcer. Plain film x-ray performed last week was negative for osteomyelitis. Wound culture obtained last week grew strep B and OSSA; she is initiated on keflex and cefdinir today; there is erythema to the toe which could be from ace wrap compression, she has a history of wrapping too tight and has has been encouraged to maintain ace wraps that we place today. We will hold off on application of apligraf today, will apply next week after antibiotic therapy has been initiated. She admits today that she has resumed taking a shower with her foot/toe submerged in water, she has been reminded to keep foot/toe out of the bath water. She will be seen in follow up next week 06/21/18-she is seen in follow-up evaluation for left great toe ulcer. She is tolerating antibiotic therapy with no GI  disturbance. The wound is stable. Apligraf was applied today. She has been  decreasing her smoking, only had 4 cigarettes yesterday and 1 today. She continues being more compliant in diabetic diet. She will follow-up next week for evaluation of site, if stable will remove at 2 weeks. 06/28/18- She is here in follow up evalution. Apligraf was placed last week, she states the dressing fell off on Tuesday and she was dressing with hydrofera blue. She is healed and will be discharged from the clinic today. She has been instructed to continue with smoking cessation, continue monitoring glucose levels, offloading for an additional 4 weeks and continue with hydrofera blue for additional two weeks for any possible microscopic opening. Readmission: 08/07/18 on evaluation today patient presents for reevaluation concerning the ulcer of her right great toe. She was previously discharged on 06/28/18 healed. Nonetheless she states that this began to show signs of drainage she subsequently went to her primary care provider. Subsequently an x-ray was performed on 08/01/18 which was negative. The patient was also placed on antibiotics at that time. Fortunately they should have been effective for the infection. Nonetheless she's been experiencing some improvement but still has a lot of drainage coming from the wound itself. 08/14/18 on evaluation today patient's wound actually does show signs of improvement in regard to the erythema at this point. She has completed the antibiotics. With that being said we did discuss the possibility of placing her in a total contact cast as of today although I think that I may want to give this just a little bit more time to ensure nothing recurrence as far as her infection is concerned. I do not want to put in the cast and risk infection at that time if things are not completely resolved. With that being said she is gonna require some debridement today. 08/21/18 on evaluation today patient  actually appears to be doing okay in regard to her toe ulcer. She's been tolerating the dressing changes without complication. With that being said it does appear that she is ready and in fact I think it's appropriate for [place] to go ahead and initiate the total contact cast today. Nonetheless she will require some sharp debridement to prepare the wound for application. Overall I feel like things have been progressing well but we do need to do something to get this to close more readily. 08/24/18 patient seen today for reevaluation after having had the total contact cast applied on Tuesday. She seems to have done very well the wound appears to be doing great and overall I'm pleased with the progress that she's made. There were no abnormal areas of rubbing from the cast on her lower extremity. 08/30/18 on evaluation today patient actually appears to be completely healed in regard to her plantar toe ulcer. She tells me at this point she's been having a lot of issues with the cast. She almost fell a couple of times the state shall the step of her dog a couple times as well. This is been a very frustrating process for her other nonetheless she has completely healed the wound which is excellent news. Overall there does not appear to be the evidence of infection at this time which is great news. 09/11/18 evaluation today patient presents for follow-up concerning her great toe ulcer on the left which has unfortunately reopened since I last saw her which was only a couple of weeks ago. Unfortunately she was not able to get in to get the shoe and potentially the AFO that's gonna be necessary due to her left foot drop. She continues  with offloading shoe but this is not enough to prevent her from reopening it appears. When we last had her in the total contact cast she did well from a healing standpoint but unfortunately the wound reopened as soon as she came out of the cast within just a couple of weeks. 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 This is also part of the reason why she falls on a regular basis. I just do not believe that is gonna be safe for [place] to reinitiate the total contact cast as last time we had this on she fell 3 times one week which is definitely not normal for her. 09/18/18 upon evaluation today the patient actually appears to be doing about the same in regard to her toe ulcer. She did not contact [facility] as I asked her to even though I had given her the prescription. In fact she actually states that she has no idea where the prescription is. She did apparently call [facility] and they told her that all she needed to do was bring the prescription in order to be able to be seen and work on getting the AFO for her left foot. With all that being said she still does not have an appointment and I'm not sure were things stand that regard. I will give her a new prescription today in order to contact them to get this set up. 09/25/18 on evaluation today patient actually appears to be doing about the same in regard to her toes ulcer. She does have a small areas which seems to have a lot of callous buildup around the edge of the wound which is going to need sharp debridement today. She still is waiting to be scheduled for evaluation with RITCHIE, JANSKY A ([id number]) [id number] Page 14 of 21 [facility] for possibility of an AFO. She states there supposed to call her tomorrow to get this set up. Unfortunately it does appear that her foot specifically the toe area is showing signs of erythema. There does not appear to be any systemic infection which is in these good news. 10/02/18 on evaluation today patient actually appears to be doing about the same in regard to her toe ulcer. This  really has not done too well although it's not significantly larger it's also not significantly smaller. She has been tolerating the dressing changes without complication. She actually has her appointment with [facility] and [facility] tomorrow to hopefully be measured for obtaining and AFO splint. I think this would be helpful preventing this from reoccurring. We had contemplated starting the cast this week although to be honest I am reluctant to do that as she's been having nausea, vomiting, and seizure activity over the past three days. She has a history of seizures and have been told is nothing that can be done for these. With that being said I do believe that along with the seizures have the nausea vomiting which upon further questioning doesn't seem to be the normal for her and makes me concerned for the possibility of infection or something else going on. I discussed this with the patient and her mother during the office visit today. I do not feel the wound is effective but maybe something else. The responses this was "this just happens to her at times  and we don't know why". They did not seem to be interested in going to the hospital to have this checked out further. 10/09/18 on evaluation today patient presents for follow-up concerning her ongoing toe ulcer. She has been tolerating the dressing changes without complication. Fortunately there does not appear to be any evidence of infection which is great news however I do think that the patient would benefit from going ahead for with the total contact cast. She's actually in a wheelchair today she tells me that she will use her walker if we initiate the cast. I was very specific about the fact that if we were gonna do the cast I wanted to make sure that she was using the walker in order to prevent any falls. She tells me she does not have stairs that she has to traverse on a regular basis at her home. She has not had any seizures since last week  again that something that happens to her often she tells me she did talk to [employer] and they said that it may take up to three weeks to get the brace approved for her. Hopefully that will not take that long but nonetheless in the meantime I do think the cast could be of benefit. 10/12/18 on evaluation today patient appears to be doing rather well in regard to her toe ulcer. It's just been a few days and already this is significantly improved both as far as overall appearance and size. Fortunately there's no sign of infection. She is here for her first obligatory cast change. 10/19/18 Seen today for follow up and management of left great toe ulcer. Wound continues to show improvement. Noted small open area with seroussang drainage with palpation. Denies any increased pain or recent fevers during visit. She will continue calcium alginate with offloading shoe. Denies any questions or concerns during visit. 10/26/18 on evaluation today patient appears to be doing about the same as when I last saw her in regard to her wound bed. Fortunately there does not appear to be any signs of infection. Unfortunately she continues to have a breakdown in regard to the toe region any time that she is not in the cast. It takes almost no time at all for this to happen. Nonetheless she still has not heard anything from the brace being made by [employer] as to when exactly this will be available to her. Fortunately there is no signs of infection at this time. 10/30/18 on evaluation today patient presents for application of the total contact cast as we just received him this morning. Fortunately we are gonna be able to apply this to her today which is great news. She continues to have no significant pain which is good news. Overall I do feel like things have been improving while she was the cast is when she doesn't have a cast that things get worse. She still has not really heard anything from [employer] regarding her brace. 11/02/18  upon evaluation today patient's wound already appears to be doing significantly better which is good news. Fortunately there does not appear to be any signs of infection also good news. Overall I do think the total contact cast as before is helping to heal this area unfortunately it's just not gonna likely keep the area closed and healed without her getting her brace at least. Again the foot drop is a significant issue for her. 11/09/18 on evaluation today patient appears to be doing excellent in regard to her toe ulcer which in fact  is completely healed. Fortunately we finally got the situation squared away with the paperwork which was needed to proceed with getting her brace approved by Medicaid. I have filled that out unfortunately that information has been sent to the orthopedic office that I worked at 2 1/2 years ago and not tired Current wound care measures. Fortunately she seems to be doing very well at this time. 11/23/18 on evaluation today patient appears to be doing More Poorly Compared to Last Time I Saw Her. At [facility] She Had Completely Healed. Currently she is continuing to have issues with reopening. She states that she just found out that the brace was approved through Medicaid now she just has to go get measured in order to have this fitted for her and then made. Subsequently she does not have an appointment for this yet that is going to complicate things we obviously cannot put her back in the cast if we do not have everything measured because they're not gonna be able to measure her foot while she is in the cast. 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 Obviously this is unfortunate and does have me somewhat worried as well. 11/30/18 on evaluation today patient's toe ulcer actually appears to be doing fairly well. The good news is she will be getting her brace in the shoes next week  on Wednesday. Hopefully we will be able to get this to heal without having to go back in the cast however she may need the cast in order to get the wound completely heal and then go from there. Fortunately there's no signs of infection at this time. 12/07/18 on evaluation today patient fortunately did receive her brace and she states she could tell this definitely makes her walk better. With that being said she's been having issues with her toe where she noticed yesterday there was a lot of tissue that was loosing off this appears to be much larger than what it was previous. She also states that her leg has been read putting much across the top of her foot just about the ankle although this seems to be receiving somewhat. The total area is still red and appears to be someone infected as best I can tell. She is previously taken Bactrim and that may be a good option for her today as well. We are gonna see what I wound culture shows as well and I think that this is definitely appropriate. With that being said outside of the culture I still need to initiate something in the interim and that's what I'm gonna go ahead and select Bactrim is a good option for her. 12/14/18 on evaluation today patient appears to be doing better in regard to her left great toe ulcer as compared to last week's evaluation. There's still some erythema although this is significantly improved which is excellent news. Overall I do believe that she is making good progress is still gonna take some time before she is where I would like her to be from the standpoint of being able to place her back into the total contact cast. Hopefully we will be where we need to be by next week. 12/21/18 on evaluation today patient actually appears to be doing poorly in regard to her toe ulcer. She's been tolerating the dressing changes without complication. Fortunately there's no signs of systemic infection although she does have a lot of  drainage from the  toe ulcer and this does seem to be causing some issues at this point. She does have erythema on the distal portion of her toe that appears to be likely cellulitis. 12/28/18 on evaluation today patient actually appears to be doing a little better in my pinion in regard to her toe ulcer. With that being said she still does have some evidence of infection at this time and for her culture she had both E. coli as well as enterococcus as organisms noted on evaluation. For that reason I think that though the Keflex likely has treated the E. coli rather well this has really done nothing for the enterococcus. We are going to have to initiate treatment for this specifically. 01/04/19 on evaluation today patient's toe actually appears to be doing better from the standpoint of infection. She currently would like to see about putting the cash back on I think that this is appropriate as long as she takes care of it and keeps it from getting wet. She is gonna have some drainage we can definitely pass this up with Drawtex and alginate to try to prevent as much drainage as possible from causing the problems. With that being said I do want to at least try her with the cast between now and Tuesday. If there any issues we can't continue to use it then I will discontinue the use of the cast at that point. 01/08/19 on evaluation today patient actually appears to be doing very well as far as her foot ulcer specifically the great toe on the left is concerned. She did have an area of rubbing on the medial aspect of her left ankle which again is from the cast. Fortunately there's no signs of infection at this point in this appears to be a very slight skin breakdown. The patient tells me she felt it rubbing but didn't think it was that bad. Fortunately there is no signs of active infection at this time which is good news. No fevers, chills, nausea, or vomiting noted at this time. 01/15/19 on evaluation today patient actually appears  to be doing well in regard to her toe ulcer. Again as previous she seems to do well and she has the cast on which indicates to me that during the time she doesn't have a cast on she's putting way too much pressure on this region. Obviously I think that's gonna be an issue as with the current national emergency concerning the Covid-19 Virus it has been recommended that we discontinue the use of total contact casting by the chief medical officer of our company, Dr. Maurine Minister. The reasoning is that if a patient becomes sick and cannot come into have the cast removed they could Ronan, Jameah A ([id number]) [phone].pdf Page 15 of 21 not just leave this on for an additional two weeks. Obviously the hospitals also do not want to receive patient's who are sick into the emergency department to potentially contaminate the region and spread the Covid-19 Virus among other sick individuals within the hospital system. Therefore at this point we are suspending the use of total contact cast until the current emergency subsides. This was all discussed with the patient today as well. 01/22/19 on evaluation today patient's wound on her left great toe appears to be doing slightly worse than previously noted last week. She tells me that she has been on this quite a bit in fact she tells me she's been awake for 38 straight hours. This is due to the  fact that she's having to care for grandparents because nobody else will. She has been taking care of them for five the last seven days since I've seen her they both have dementia his is from a stroke and her grandmother's was progressive. Nonetheless she states even her mom who knows her condition and situation has only help two of those days to take care of them she's been taking care of the rest. Fortunately there does not appear to be any signs of active infection in regard to her toe at this point although obviously it doesn't look as good as it did  previous. I think this is directly related to her not taking off the pressure and friction by way of taking things easy. Though I completely understand what's going on. 01/29/19 on evaluation today patient's tools are actually appears to be showing some signs of improvement today compared to last week's evaluation as far as not necessarily the overall size of the wound but the fact that she has some new skin growth in between the two ends of the wound opening. Overall I feel like she has done well she states that she had a family member give her what sounds to be a CAM walker boot which has been helpful as well. 02/05/19 on evaluation today patient's wound bed actually appears to be doing significantly better in regard to her overall appearance of the size of the wound. With that being said she is still having an issue with offloading efficiently enough to get this to close. Apparently there is some signs of infection at this point as well unfortunately. Previously she's done well of Augmentin I really do not see anything that needs to be culture currently but there theme and cellulitis of the foot that I'm seeing I'm gonna go ahead and place her on an antibiotic today to try to help clear this up. 02/12/2019 on evaluation today patient actually appears to be doing poorly in regard to her overall wound status. She tells me she has been using her offloading shoe but actually comes in today wearing her tennis shoe with the AFO brace. Again as I previously discussed with her this is really not sufficient to allow the area to heal appropriately. Nonetheless she continues to be somewhat noncompliant and I do wonder based on what she has told my nurse in the past as to whether or not a good portion of this noncompliance may be recreational drug and alcohol related. She has had a history of heroin overdose and this was fairly recently in the past couple of months that have been seeing her. Nonetheless overall I  feel like her wound looks significantly worse today compared to what it was previous. She still has significant erythema despite the Augmentin I am not sure that this is an appropriate medication for her infection I am also concerned that the infection may have gone down into her bone. 02/19/19 on evaluation today patient actually appears to be doing about the same in regard to her toe ulcer. Unfortunately she continues to show signs of bone exposure and infection at this point. There does not appear to be any evidence of worsening of the infection but I'm also not really sure that it's getting significantly better. She is on the Augmentin which should be sufficient for the Staphylococcus aureus infection that she has at this point. With that being said she may need IV antibiotics to more appropriately treat this. We did have a discussion today about hyperbaric option  therapy. 02/28/19 on evaluation today patient actually appears to be doing much worse in regard to the wound on her left great toe as compared to even my previous evaluation last week. Unfortunately this seems to be training in a pretty poor direction. Her toe was actually now starting to angle laterally and I can actually see the entire joint area of the proximal portion of the digit where is the distal portion of the digit again is no longer even in contact with the joint line. Unfortunately there's a lot more necrotic tissue around the edge and the toe appears to be showing signs of becoming gangrenous in my pinion. I'm very concerned about were things stand at this point. She did see infectious disease and they are planning to send in a prescription for Sivextro for her and apparently this has been approved. With that being said I don't think she should avoid taking this but at the same time I'm not sure that it's gonna be sufficient to save her toe at this point. She tells me that she still having to care for grandparents which I think  is putting quite a bit of strain on her foot and specifically the total area and has caused this to break down even to a greater degree than would've otherwise been expected. 03/05/19 on evaluation today patient actually appears to be doing quite well in regard to her toe all things considering. She still has bone exposed but there appears to be much less your thing on overall the appearance of the wound and the toe itself is dramatically improved. She still does have some issues currently obviously with infection she did see vascular as well and there concerned that her blood flow to the toad. For that reason they are setting up for an angiogram next week. 03/14/19 on evaluation today patient appears to be doing very poor in regard to her toe and specifically in regard to the ulceration and the fact that she's starting to notice the toe was leaning even more towards the lateral aspect and the complete joint is visible on the proximal aspect of the joint. Nonetheless she's also noted a significant odor and the tip of the toe is turning more dark and necrotic appearing. Overall I think she is getting worse not better as far as this is concerned. For that reason I am recommending at this point that she likely needs to be seen for likely amputation. READMISSION 03/19/2021 This is a patient that we cared for in this clinic for a prolonged period of time in 2019 and 2020 with a left foot and left first toe wound. 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 Excell Seltzer. In December 2021 she had an ulcer on her right great toe as well as the fourth and fifth toes. 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 She also had an angiogram at that time and underwent angioplasty of the right anterior tibial artery. In any case she claims that the wound on the right foot is  closed I did not look at this today which was probably an oversight although I think that should be done next week. After her surgery she developed a dehiscence but I do not see any follow-up of this. According to Dr. Bernette Redbird last review that she was out of the area being cared for by another physician  but recently came back to his attention. 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 A culture of this area showed E. coli apparently before she came back to see Dr. Excell Seltzer she was supposed to be receiving antibiotics but she did not really take them. Nor is she offloading this area at all. Finally her last hemoglobin A1c listed in epic was in March 2022 at 14.1 she says things are a lot better since then although I am not sure. 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 She had this wound at the time. During this admission she had E. coli sepsis a TEE was negative. 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 She is still a heavy cigarette smoker. She has a history of bipolar disorder seizure disorder ABI in our clinic was 1.05 6/1; left midfoot in the setting of a TMA done previously. Round circular wound with a "knuckle" of protruding tissue. The problem is that the knuckle was not attached to any of the surrounding granulation and this probed proximally widely I removed a large portion of this tissue. This wound goes with considerable undermining laterally. I do not feel any bone there was no purulence but this is a deep wound. 6/8; in spite of the debridement I did last week. She arrives with a wound looking exactly the same. A protruding "knuckle" of  tissue nonadherent to most of the surrounding tissue. There is considerable depth around this from 6-12 o'clock at 2.7 cm and undermining of 1 cm. This does not look overtly infected and the x- ray I did last week was negative for any osseous abnormalities. We have been using silver collagen 6/15; deep tissue culture I did last week showed moderate staph aureus and moderate Pseudomonas. This will definitely require prolonged antibiotic therapy. The pathology on the protuberant area was negative for malignancy fungus etc. 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 We have been using silver collagen. I am going to be prescribing Levaquin for 2 weeks. Her CT scan of the foot is down for 7/5 6/22; CT scan of the foot on 7 5. She says she has hardware in the left leg from her previous fracture. She is on the Levaquin for the deep tissue culture I did that showed methicillin sensitive staph aureus and Pseudomonas. I gave her a 2-week supply and she will have another week. She arrives in clinic today with the same protuberant tissue however this is nonadherent to the tissue surrounding it. I am really at a loss to explain this unless there is underlying deep tissue infection 6/29; patient presents for 1 week follow-up. She has been using collagen to the wound bed. She reports taking her antibiotics as prescribed.She has no complaints or issues today. She denies signs of infection. 7/6; patient presents for one week followup. She has been using collagen to the wound bed. She states she is taking Levaquin however at times she is not able SHAKETTA, KUBIN A ([id number]) [id number] Page 16 of 21 to keep it down. She denies signs of infection. 7/13; patient presents for 1 week follow-up. She has been using silver alginate to the wound bed. She still has nausea when taking her  antibiotics. She denies signs of infection. 7/20;  patient presents for 1 week follow-up. She has been using silver alginate with gentamicin cream to the wound bed. She denies any issues and has no complaints today. She denies signs of infection. 7/27; patient presents for 1 week follow-up. She continues to use silver alginate with gentamicin cream to the wound bed. She reports starting her antibiotics. She has no issues or complaints. Overall she reports stability to the wound. 8/3; patient presents for 1 week follow-up. She has been using silver alginate with gentamicin cream to the wound bed. She reports completing all antibiotics. She has no issues or complaints today. She denies signs of infection. 8/17; patient presents for 2-week follow-up. He is to use silver alginate to the wound bed. She has no issues or complaints today. She denies signs of infection. She reports her pain has improved in her foot since last clinic visit 8/24; patient presents for 1 week follow-up. She continues to use silver alginate to the wound bed. She has no issues or complaints. She denies signs of infection. Pain is stable. 9/7; patient presents for follow-up. She missed her last week appointment due to feeling ill. She continues to use silver alginate. She has a new wound to the right lower extremity that is covered in eschar. She states It occurred over the past week and has no idea how it started. She currently denies signs of infection. 9/14; patient presents for follow-up. T the left foot wound she has been using gentamicin cream and silver alginate. T the right lower extremity wound she has o o been keeping this covered and has not obtain Santyl. 9/21; patient presents for follow-up. She reports using gentamicin cream and silver alginate to the left foot and Santyl to the right lower extremity wound. She has no issues or complaints today. She denies signs of infection. 9/28; patient presents for follow-up. She reports a new wound to her right heel. She states  this occurred a few days ago and is progressively gotten worse. She has been trying to clean the area with a Q-tip and Santyl. She reports stability in the other 2 wounds. She has been using gentamicin cream and silver alginate to the left foot and Santyl to the right lower extremity wound. 10/12; patient presents for follow-up. She reports improvement to the wound beds. She is seeing vein and vascular to discuss the potential of a left BKA. She states they are going to do an arteriogram. She continues to use silver alginate with dressing changes to her wounds. 11/2; patient presents for follow-up. She states she has not been doing dressing changes to the wound beds. She states she is not able to offload the areas. She reports chronic pain to her left foot wound. 11/9; patient presents for follow-up. She came in with only socks on. She states she forgot to put on shoes. It is unclear if she is doing any dressing changes. She currently denies systemic signs of infection. 11/16; patient presents for follow-up. She came again only with socks on. She states she does not wear shoes ever. It is unclear if she does dressing changes. She currently denies systemic signs of infection. 11/23; patient presents for follow-up. She wore her shoes today. It still unclear exactly what dressing she is using for each wound but she did states she obtained Dakin's solution and has been using this to the left foot wound. She currently denies signs of infection. 11/30; patient presents for follow-up. She has no  issues or complaints today. She currently denies signs of infection. 12/7; patient presents for follow-up. She has no issues or complaints today. She has been using Hydrofera Blue to the right heel wound and Dakin solution to the left foot wound. Her right anterior leg wound is healed. She currently denies signs of infection. 12/14; patient presents for follow-up. She has been using Hydrofera Blue to the right heel  and Dakin's to the left foot wounds. She has no issues or complaints today. She denies signs of infection. 12/21; patient presents for follow-up. She reports using Hydrofera Blue to the right heel and Dakin's to the left foot wound. She denies signs of infection. 12/28; patient presents for follow-up. She continues to use Dakin's to the left foot wound and Hydrofera Blue to the right heel wound. She denies signs of infection. 1/4; patient presents for follow-up. She has no issues or complaints today. She denies signs of infection. 1/11; patient presents for follow-up. It is unclear if she has been dressing these wounds over the past week. She currently denies signs of infection. 1/18; patient presents for follow-up. She states she has been using Dakin's wet-to-dry dressings to the left foot. She has been using Hydrofera Blue to the right foot foot wound. She states that the anterior right leg wound has reopened and draining serous fluid. She denies signs of infection. 1/25; patient presents for follow-up. She has no issues or complaints today. 2/1; patient presents for follow-up. She has no issues or complaints today. She denies signs of infection. 2/8; patient presents for follow-up. She has lost her surgical shoes. She did not have a dressing to the right heel wound. She currently denies signs of infection. 2/15; patient presents for follow-up. She reports more pain to the right heel today. She denies purulent drainage Or fever/chills 2/22; patient presents for follow-up. She reports taking clindamycin over the past week. She states that she continues to have pain to her right heel. She reports purulent drainage. Readmission 03/16/2022 Ms. Talisia Munzer is a 47 year old female with a past medical history of type 2 diabetes, osteomyelitis to her feet, chronic systolic heart failure and bipolar disorder that presents to the clinic for bilateral feet wounds and right lower extremity wound. She was  last seen in our clinic on 12/15/2021. At that time she had purulent drainage coming out of her right plantar foot and I recommended she go to the ED. She states she went to [facility] and has been there for the past 3 months. I cannot see the records. She states she had OR debridement and was on several weeks of IV antibiotics while inpatient. Since discharge she has not been taking care of the wound beds. She had nothing on her feet other than socks today. She currently denies signs of infection. 5/31; patient presents for follow-up. She has been using Dakin's wet-to-dry dressings to the wound beds on her feet bilaterally and antibiotic ointment to the right anterior leg wound. She had a wound culture done at last clinic visit that showed moderate Pseudomonas aeruginosa sensitive to ciprofloxacin. She currently denies systemic signs of infection. 6/14; patient presents for follow-up. She received Keystone 5 days ago and has been using this on the wound beds. She states that last week she had to go to the hospital because she had increased warmth and erythema to the right foot. She was started on 2 oral antibiotics. She states she has been taking these. She currently denies systemic signs of infection. She  has no issues or complaints today. 6/21; patient presents for follow-up. She states she has been using Keystone antibiotics to the wound beds. She has no issues or complaints today. She denies signs of infection. 6/28; patient presents for follow-up. She has been using Keystone antibiotics to the wound beds. She has no issues or complaints today. 7/12; patient presents for follow-up. Has been using Keystone antibiotics to the wound beds with calcium alginate. She has no issues or complaints today. She RANIESHA, HURNEY A ([id number]) [id number] Page 17 of 21 never followed up with her orthopedic surgeon who did the OR debridement to the right foot. We discussed  the total contact cast for the left foot and patient would like to do this next week. 7/19; patient presents for follow-up. She has been using Keystone antibiotics with calcium alginate to the wound beds. She has no issues or complaints today. Patient is in agreement to do the total contact cast of the left foot today. She knows to return later this week for the obligatory cast change. 05-13-2022 upon evaluation today patient's wound which she has the cast of the left leg actually appears to be doing significantly better. Fortunately I do not see any signs of active infection locally or systemically which is great news and overall I am extremely pleased with where we stand currently. 7/26; patient presents for follow-up. She has a cast in place for the past week. She states it irritated her shin. Other than that she tolerated the cast well. She states she would like a break for 1 week from the cast. We have been using Keystone antibiotic and Aquacel to both wound beds. She denies signs of infection. 8/2; patient presents for follow-up. She has been using Keystone and Aquacel to the wound beds. She denies any issues and has no complaints. She is agreeable to have the cast placed today for the left leg. 06-03-2022 upon evaluation today patient appears to be doing well with regard to her wound she saw some signs of improvement which is great news. Fortunately I do not see any evidence of active infection locally or systemically at this time which is great news. No fevers, chills, nausea, vomiting, or diarrhea. 8/16; patient presents for follow-up. She has no issues or complaints today. We have been using Keystone and Aquacel to the wound beds. The left lower extremity is in a total contact cast. She is tolerated this well. 8/23; patient presents for follow-up. She has had the total contact cast on the left leg for the past week. Unfortunately this has rubbed and broken down the skin to the medial foot.  She currently denies signs of infection. She has been using Keystone antibiotic to the right plantar foot wound. 8/30; patient presents for follow-up. We have held off on the total contact cast for the left leg for the past week. Her wound on the left foot has improved and the previous surrounding breakdown of skin has epithelialized. She has been using Keystone antibiotic to both wound beds. She has no issues or complaints today. She denies signs of infection. 9/6; patient presents for follow-up. She has ordered her's Keystone antibiotic and this is arriving this week. She has been doing Dakin's wet-to-dry dressings to the wound beds. She denies signs of infection. She is agreeable to the total contact cast today. 9/13; patient presents for follow-up. She states that the cast caused her left leg shin to be sore. She would like to take a break from the cast this week.  She has been using Keystone antibiotic to the right plantar foot wound. She denies signs of infection. 9/20; patient presents for follow-up. She has been using Keystone antibiotics to the wound beds with calcium alginate to the right foot wound and Hydrofera Blue to the left foot wound. She is agreeable to having the cast placed today. She has been approved for Apligraf and we will order this for next clinic visit. 9/27; patient presents for follow-up. We have been using Keystone antibiotic with Hydrofera Blue to the left foot wound under a total contact cast. T the right o foot wound she has been using Keystone antibiotic and calcium alginate. She declines a total contact cast today. Apligraf is available for placement and she would like to proceed with this. 07-28-2022 upon evaluation today patient appears to be doing well currently in regard to her wound. She is actually showing signs of significant improvement which is great news. Fortunately I do not see any evidence of active infection locally nor systemically at this time. She has  been seeing Dr. Mikey Bussing and to be honest has been doing very well with the cast. Subsequently she comes in today with a cast on and we did reapply that today as well. She did not really want to she try to talk me out of that but I explained that if she wanted to heal this is really the right way to go. Patient voiced understanding. In regard to her right foot this is actually a lot better compared to the last time I saw her which is also great news. 10/11; patient presents for follow-up. Apligraf and the total contact cast was placed to the left leg at last clinic visit. She states that her right foot wound had burning pain to it with the placement of Apligraf to this area. She has been doing [place] over this area. She denies signs of infection including increased warmth, erythema or purulent drainage. 11/1; 3-week follow-up. The patient fortunately did not have a total contact cast or an Apligraf and on the left foot. She has been using Keystone ABD pads and kerlix and her own running shoes She arrives in clinic today with thick callus and a very poor surface on the left foot on the right nonviable skin subcutaneous tissue and a deep probing hole. 11/15; patient missed her last clinic appointment. She states she has not been dressing the wound beds for the past 2 weeks. She states that at she had a new roommate but is now going back to live with her mother. Apparently its been a distracting 2 weeks. Patient currently denies signs of infection. 11/22; patient presents for follow-up. She states she has been using Keystone antibiotic and Dakin's wet-to-dry dressings to the wound beds. She is agreeable for cast placement today. We had ordered Apligraf however this has not been received by our facility. 11/29; Patient had a total contact cast placed at last clinic visit and she tolerated this well. We were using silver alginate under the cast. Patient's been using Keystone antibiotic with Aquacel to the  right plantar foot wound. She has no issues or complaints today. Apligraf is available for placement today. Patient would like to proceed with this. 12/6; patient presents for follow-up. She had Apligraf placed in standard fashion last clinic visit under the total contact cast to the left lower extremity. She has been using Keystone antibiotic and Aquacel to the right plantar foot wound. She has no issues or complaints today. 12/13; patient presents for follow-up.  She has finished 5 Apligraf placements. Was told she would not qualify for more. We have been doing a total contact cast to the left lower extremity. She has been using Keystone antibiotic and Aquacel to the right plantar foot wound. She has no issues or complaints today. 12/20; patient presents for follow-up. We have been using Hydrofera Blue with Keystone antibiotic under a total contact cast of the left lower extremity. She reports using Keystone antibiotic and silver alginate to the right heel wound. She has no issues or complaints today. 12/27; patient presents with a healthy wound on the left midfoot. We have Apligraf to apply that to that more also using a total contact cast. On the right we are using Keystone and silver alginate. She is offloading the right heel with a surgical shoealthough by her admission she is on her feet quite a bit 1/3; patient presents for follow-up. Apligraf was placed to the wound beds last clinic visit. She was placed in a total contact cast to the left lower extremity. She declines a total contact cast today. She states that her mother is in the hospital and she cannot adequately get around with the cast on. 1/10; patient presents for follow-up. She declined the total contact cast at last clinic visit. Both wounds have declined in appearance. She states that she has been on her feet and not offloading the wound beds. She currently denies signs of infection. 1/17; patient presents for follow-up. She had the  total contact cast along with Apligraf placed last week to the left lower extremity. She tolerated this well. She has been using Aquacel Ag and Keystone antibiotic to the right heel wound. She currently denies signs of infection. She has no issues or complaints today. 1/24; patient presents for follow-up. We have been using Apligraf to the left foot wound along with a total contact cast. She has done well with this. T the right o heel wound she has been using Aquacel Ag and Keystone antibiotic ointment. She has no issues or complaints today. She denies signs of infection. DONNE, WADE A ([id number]) [id number] Page 18 of 21 1/31; patient presents for follow-up. We have been using Apligraf to the left foot wound along with the total contact cast. She continues to do well with this. To the right heel we have been using Aquacel Ag and Keystone antibiotic ointment. She has no issues or complaints today. 12-01-2022 upon evaluation patient is seen today on my schedule due to the fact that she unfortunately was in the hospital yesterday. Her cast needed to come off the only reason she is out of the hospital is due to the fact that they would not take it off at the hospital which is somewhat bewildered reading to me to be perfectly honest. I am not certain why this was but either way she was released and then was placed on my schedule today in order to get this off and reapply the total contact casting as appropriate. I do not have an Apligraf for her today it was applied last week and today's actually expired yesterday as there was some scheduling conflicts with her being in the hospital. Nonetheless we do not have that for reapplication today but the good news that she is not draining too much and the Apligraf can go for up to 2 weeks so I am going to go ahead and reapply the total contact casting but we are going to leave the Apligraf in place. 2/14; patient presents for  follow-up.  T the left leg she has had the total contact cast and Apligraf for the past week. She has had no issues with this. T the o o right heel she has been using antibiotic ointment and Aquacel Ag. 2/21; patient presents for follow-up. We have been using Apligraf and a total contact cast to the left lower extremity. She is tolerated this well. Unfortunately she is not approved for any more Apligraf per insurance. She has been using antibiotic ointment and Aquacel Ag to the right foot. She has no issues or complaints today. 2/28; patient presents for follow-up. We have been using Hydrofera Blue and antibiotic ointment under the total contact cast to the left lower extremity. He has been using Aquacel Ag with antibiotic ointment to the right plantar foot. She has no issues or complaints today. 3/6; patient presents for follow-up. She did not obtain her gentamicin ointment. She has been using Aquacel Ag to the right plantar foot wound. We have been using Hydrofera Blue with antibiotic ointment under the total contact cast to the left lower extremity. She has no issues or complaints today. 3/12; patient presents for follow-up. She has been using gentamicin ointment and Aquacel Ag to the right plantar foot wound. We have been using Hydrofera Blue with antibiotic ointment under the total contact cast on the left lower extremity foot wound. She has no issues or complaints today. 3/20; patient presents for follow-up. She has been using gentamicin ointment and Aquacel Ag to the right plantar foot wound. We have been using Hydrofera Blue with antibiotic ointment under the total contact cast to the left lower extremity wound. She has no issues or complaints today. 3/27; patient presents for follow-up. We have been using antibiotic ointment and Aquacel Ag to the right plantar foot wound. We have been using Hydrofera Blue with antibiotic ointment under the total contact cast to the left lower extremity. Both  wounds are smaller. 01-24-2023 upon evaluation today patient actually appears to be doing better compared to last time I saw her. It has been several weeks since I last saw her but nonetheless I think the total contact casting is doing a good job. Fortunately I do not see any evidence of infection or worsening overall which is great news and in general I do believe that she is moving in the appropriate direction. It has been a very long road but nonetheless I feel like she is nearing the end at least in regard to the left foot and even the right foot looks much better to be perfectly honest. 01-31-2023 upon evaluation today patient appears to be doing well currently in regard to her wounds. She has been require some sharp debridement which I think is probably bone both sides to remove a lot of callus that has built up at this point. I discussed that with her today. Also think total contact cast is doing well for the left foot Emina continue that as well. 4/17; patient presents for follow-up. We have been doing Hydrofera Blue with antibiotic ointment under the total contact cast to the left lower extremity and Aquacel Ag with antibiotic ointment to the right foot wound. She has been using her offloading heel shoe here. 4/24; patient presents for follow-up. We have been doing Hydrofera Blue with antibiotic ointment under the total contact cast to the left lower extremity and Aquacel Ag with antibiotic ointment to the right foot wound. She has been using her offloading heel shoe here. Wounds are smaller with slough accumulation. 5/1; patient  presents for follow-up. We have been doing Hydrofera Blue with antibiotic ointment under the total contact cast to the left lower extremity and Aquacel Ag with antibiotic ointment to the right foot wound. Left foot wound is stable and right foot wound appears smaller. 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 On the left she is using Hydrofera Blue topical antibiotic  under a total contact cast, on the right plantar heel gentamicin Aquacel Ag. According to our intake nurse both are making slow but steady improvements. 5/15; patient presents for follow-up. We have been doing Hydrofera Blue with antibiotic ointment under the total contact cast on the left and antibiotic ointment with Aquacel Ag on the right. Unfortunately the left foot wound has declined in size and appearance. We also do not have the total contact cast available in office today. Patient denies signs of infection. 5/22; patient presents for follow-up. We have been doing Hydrofera Blue and antibiotic ointment to the left foot and Aquacel Ag with antibiotic ointment to the right foot. We took a break from the cast since we did not have it placed at last week. Wounds look stable if not slightly improved. 03-27-2023 patient presents today she has had this For Most 2 Weeks Because She Was in the Hospital and They Did Not T It off. Again That Is Really Not ake Something That She Could Control but Nonetheless We Are Seeing Her at This Point Today for Reevaluation. We Do Want to Go and See about Reapply the Cast. Again We Had All Set Everything up to Go Ahead and Do This When We Went to Get the Cast Only to Realize That Not Had Come in and We Were out at the Moment. With That Being [place] We Will Get Have T Get the Exam before We Can Reapply the T Contact Casting Going Forward. o otal 6/12; patient presents for follow-up. She has been using Hydrofera Blue to the left foot wound and Aquacel Ag to the right foot wound. No cast was available last week placed on the patient so she has been offloading with her surgical shoe. She has no issues or complaints today. 6/19; patient presents for follow-up. We have been using Hydrofera Blue to the left foot wound under the total contact cast and Aquacel Ag to the right wound. She has no issues or complaints today. The wounds are smaller. 6/26; patient presents for  follow-up. We have been using Hydrofera Blue to the left foot wound under the total contact cast and Aquacel Ag to the right foot wound. Wounds are smaller. Patient has no issues or complaints today. 7/3; patient presents for follow-up. We have been using Hydrofera Blue to the left foot wound under the total contact cast and Aquacel Ag to the right foot wound. Wounds Appear smaller. No signs of infection. 7/10; patient presents for follow-up. We have been using Hydrofera Blue to the left foot wound under the total contact cast and Aquacel Ag to the right foot wound. Left foot wound is smaller. Right foot wound is stable. 7/17; patient presents for follow-up. We have been using Hydrofera Blue to the left foot under the total contact cast and Aquacel Ag to the right foot wound. She has no issues or complaints today. 7/24; patient presents for follow-up. We have been using Hydrofera Blue to the left foot under the total contact cast and Aquacel Ag to the right foot wound. Unfortunately she was bit by her dog over the weekend and developed a wound to the  right lower leg. She has been keeping the area covered. 7/31; patient presents for follow-up. We have been using Hydrofera Blue to the left foot under the total contact cast and Aquacel Ag to the right foot wound and mupirocin ointment to the right anterior leg wound. She started her oral antibiotics 2 days ago. 8/7; patient presents for follow-up. We have been using Hydrofera Blue and antibiotic ointment to the left foot under the total contact cast and Aquacel Ag to the right foot wound. Antibiotic ointment to the right anterior leg wound. She completed her oral antibiotics. She has no issues or complaints today. Wounds continue to improve albeit slowly. TALAJAH, DUBY A ([id number]) [id number] Page 19 of 21 8/14; 8/14; patient has 3 wounds 1 on the left plantar heel 1 on the right plantar heel and a small wound on the right  anterior lower leg. We have been using Hydrofera Blue in the left heel Aquacel on the right heel. The left heel has been in a total contact cast 8/21; patient presents for follow-up. We have been using Hydrofera Blue to the left foot wound under the total contact cast. T the right heel patient has been o using Aquacel Ag. T the anterior leg wound antibiotic ointment. All wounds have healed. o 8/28; patient presents for follow-up. She has been using her surgical shoe with offloading felt pads. Her wounds have remained closed. 9/11; patient presents for final follow-up to assure that her wounds have remained closed. Unfortunately the left foot wound has opened again. But she has been wearing her Surgical shoe for offloading with felt pad. 07-14-2023 upon evaluation today patient appears to be doing well currently in regard to her wound. She has been tolerating the dressing changes without complication with total contact casting. I am going to be helping take care of her at this point as she is transferring from Dr. Mikey Bussing to me since Dr. Mikey Bussing is on maternity leave. 07-24-2023 upon evaluation today patient appears to be doing decently well currently in regard to her wound. She is actually showing signs of improvement in regard to the left foot and I feel like this is much better than last week no debridement even necessary today. We may have to perform some slight debridement come next week we will see how things go. Monitor for any signs of worsening overall and obviously if anything changes she should be letting us know. 08-01-2023 upon evaluation today patient appears to be doing excellent in regard to her feet. The right foot/heel area is completely healed she does have some callus buildup I am going to trim this down a little bit I do not want to build up to the point of cracking she is doing well with the urea cream. With regard to the left heel she is doing well with casting I am going to  perform some debridement here to clear away before reapplying the cast and I think she is really doing quite well though. 08-07-2023 upon evaluation patient's wound actually showed signs of excellent improvement and actually very pleased with where we stand I do believe that she is making good headway towards complete closure which is great news. 08-14-2023 upon evaluation today patient appears to be doing well currently in regard to her wound. She actually is going require a little bit of debridement but fortunately nothing too significant today actually feel like she is doing much better. This is mainly to remove callus on the little slough and biofilm down to good  subcutaneous tissue. In general I think that she is making excellent headway here. 08-21-2023 upon evaluation today patient appears to be doing well currently in regard to her wound which is actually showing signs of significant improvement. I am extremely pleased with where things stand I do not see any signs of infection at this time which is great news and overall I do think that we are making headway towards complete closure which is also excellent news. 08-30-2023 upon evaluation today patient's wound is showing signs of continued improvement. Fortunately I do not see any evidence of worsening overall and I believe that the patient is making excellent headway towards closure. I am extremely pleased with where we stand. 09-04-2023 upon evaluation today patient appears to be doing well currently in regard to her foot ulcer. She has been tolerating the dressing changes without complication. Fortunately I do not see any signs of active infection locally or systemically at this time which is great news. No fevers, chills, nausea, vomiting, or diarrhea. 09-11-2023 upon evaluation today patient appears to be doing well currently in regard to her wound on the left foot. I feel like she is actually showing signs of improvement I feel like the  collagen is doing a good job. With that being said unfortunately she has an area where there is a spot on the right foot that has not really open but at least there looks like there may have been something that popped off of this area. 09-18-2023 upon evaluation today patient appears to be doing well currently in regard to her wound although to be honest she still continues to have issues here with the wound not healing as rapidly as I would expect. She does a lot of walking on the cast from what I am hearing today and to be honest as I mention to her previously this cast is not exactly appropriate for her based on the size of her leg is technically too small and I think this is causing issues with rubbing and therefore callus buildup that would not otherwise occur with a properly fitting cast. I discussed that with her today and I think that potentially having a offloading shoe would be better specifically a front offloading boot. 09-25-2023 upon evaluation patient's wound is showing signs of improvement which is great news and in general I do feel like that she is really doing quite well. I do not see any signs of active infection at this time which is excellent and in general I think that we are moving in the right direction here. She did get the offloading boot off of [employer] that working to get a try it is a front off loader which I think should do quite well and we will get a see how that does over the next week. Objective Constitutional Well-nourished and well-hydrated in no acute distress. Vitals Time Taken: 9:08 AM, Height: 69 in, Weight: 178 lbs, BMI: 26.3, Temperature: 98.5 F, Pulse: 62 bpm, Respiratory Rate: 16 breaths/min, Blood Pressure: 111/63 mmHg. Respiratory normal breathing without difficulty. Psychiatric this patient is able to make decisions and demonstrates good insight into disease process. Alert and Oriented x 3. pleasant and cooperative. General Notes: Upon inspection  patient's wound bed actually did require some sharp debridement postdebridement wound bed appears to be doing significantly better which is great news and in general I do feel like that we are really doing quite well as far as healing is concerned. Integumentary (Hair, Skin) Wound #12R status is Open. Original  cause of wound was Pressure Injury. The date acquired was: 03/16/2020. The wound has been in treatment 79 weeks. The wound is located on the Left,Medial,Plantar Foot. The wound measures 0.2cm length x 0.2cm width x 0.1cm depth; 0.031cm^2 area and 0.003cm^3 volume. There is Fat Layer (Subcutaneous Tissue) exposed. There is a medium amount of serosanguineous drainage noted. The wound margin is flat and intact. There is no granulation within the wound bed. There is no necrotic tissue within the wound bed. 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 There was a Excisional Skin/Subcutaneous Tissue Debridement with a total area of 0.03 sq cm performed by [name], PA-C. With the following instrument(s): Curette to remove Viable and Non-Viable tissue/material. Material removed includes Callus, Subcutaneous Tissue, and Slough. A time out was conducted at 10:00, prior to the start of the procedure. A Minimum amount of bleeding was controlled with Pressure. The procedure was tolerated well. Post Debridement Measurements: 0.2cm length x  0.2cm width x 0.1cm depth; 0.003cm^3 volume. Character of Wound/Ulcer Post Debridement is stable. Severity of Tissue Post Debridement is: Fat layer exposed. Post procedure Diagnosis Wound #12R: Same as Pre-Procedure Plan Follow-up Appointments: Return Appointment in 1 week. Bathing/ Shower/ Hygiene: May shower; gently cleanse wound with antibacterial soap, rinse and pat dry prior to dressing wounds No tub bath. Off-Loading: Open toe surgical shoe - right foot Other: - front offloading boot left foot WOUND #12R: - Foot Wound Laterality: Plantar, Left, Medial Cleanser: Vashe 5.8 (oz) Every Other Day/30 Days Discharge Instructions: Use vashe 5.8 (oz) as directed Prim Dressing: Promogran Matrix 4.34 (in) Every Other Day/30 Days ary Discharge Instructions: Moisten w/normal saline or sterile water and apply into wound bed Secondary Dressing: Gauze Every Other Day/30 Days Discharge Instructions: As directed: dry, moistened with saline or moistened with Dakins Solution Secured With: Medipore T - 38M Medipore H Soft Cloth Surgical T ape ape, 2x2 (in/yd) Every Other Day/30 Days Secured With: [employer] Sterile or Non-Sterile 6-ply 4.5x4 (yd/yd) Every Other Day/30 Days Discharge Instructions: Apply Kerlix as directed Secured With: [employer] Dressing, Latex-free, Size 5, Small-Head / Shoulder / Thigh Every Other Day/30 Days 1. I would recommend that we have the patient go to continue to monitor for any signs of infection or worsening. Based on what I see I do feel like that she is really doing quite well and I am hopeful that the new offloading boot is can be beneficial for her. 2. I am also can recommend the patient should continue to monitor for any signs of infection or worsening if anything changes she knows contact the office and let me know. We will see patient back for reevaluation in 1 week here in the clinic. If anything worsens or changes patient will contact our office for  additional recommendations. Electronic Signature(s) Signed: 09/25/2023 2:19:20 PM By: [name] PA-C Entered By: [name] on 09/25/2023 14:19:19 Earmon Phoenix A ([id number]) [id number] Page 21 of 21 -------------------------------------------------------------------------------- SuperBill Details Patient Name: Date of Service: AHMARIE, UN 09/25/2023 Medical Record Number: [id number]  Patient Account Number: [id number] Date of Birth/Sex: Treating RN: [birth year] (47 y.o. [name] Primary Care Provider: [name] Other Clinician: [name] Referring Provider: Treating Provider/Extender: [name] Weeks in Treatment: 79 Diagnosis Coding ICD-10 Codes Code Description E11.621 Type 2 diabetes mellitus with foot ulcer L97.528 Non-pressure chronic ulcer of other part of left foot with other specified severity E11.42 Type 2 diabetes mellitus with diabetic polyneuropathy Z79.4 Long term (current) use of insulin N18.6 End stage renal disease Z99.2 Dependence on renal dialysis L84 Corns and callosities Facility Procedures : CPT4 Code: [id number] Description: 11042 - DEB SUBQ TISSUE 20 SQ CM/< ICD-10 Diagnosis Description L97.528 Non-pressure chronic ulcer of other part of left foot with other specified seve Modifier: rity Quantity: 1 Physician Procedures : CPT4 Code Description Modifier 11042 11042 - WC PHYS SUBQ TISS 20 SQ CM ICD-10 Diagnosis Description L97.528 Non-pressure chronic ulcer of other part of left foot with other specified severity Quantity: 1 Electronic Signature(s) Signed: 09/25/2023 2:19:33 PM By: [name] PA-C Entered By: [name] on 09/25/2023 14:19:32

## 2023-10-02 ENCOUNTER — Encounter: Payer: MEDICAID | Admitting: Physician Assistant

## 2023-10-02 DIAGNOSIS — E11621 Type 2 diabetes mellitus with foot ulcer: Secondary | ICD-10-CM | POA: Diagnosis not present

## 2023-10-02 NOTE — Progress Notes (Signed)
[name], [name] [name] ([id number]) [id number] Page 1 of 21 Visit Report for 10/02/2023 Chief Complaint Document Details Patient Name: Date of Service: [name], [name] 10/02/2023 9:00 [name]: [id number] Patient Account Number: [id number] Date of Birth/Sex: Treating [name]: 1975/11/05 (47 y.o. [name] Primary Care Provider: Lindwood [name] Other Clinician: Betha [name] Referring Provider: Treating Provider/Extender: [name] Weeks in Treatment: 80 Information Obtained from: Patient Chief Complaint Left foot ulcer Electronic Signature(s) Signed: 10/02/2023 9:15:47 AM By: Heather Derry PA-C Entered By: [name] on 10/02/2023 09:15:46 -------------------------------------------------------------------------------- Debridement Details Patient Name: Date of Service: [name] 10/02/2023 9:00 [name]: [id number] Patient Account Number: [id number] Date of Birth/Sex: Treating [name]: August 17, 1976 (47 y.o. [name] Primary Care Provider: Lindwood [name] Other Clinician: Betha [name] Referring Provider: Treating Provider/Extender: [name] Weeks in Treatment: 08 Debridement Performed for Assessment: Wound #12R Left,Medial,Plantar Foot Performed By: Physician Heather Derry, PA-C The following information was scribed by: [name] The information was scribed for: [name] Debridement Type: Debridement Severity of Tissue Pre Debridement: Fat layer exposed Level of Consciousness (Pre-procedure): Awake and Alert Pre-procedure Verification/Time Out Yes - 09:47 Taken: Start Time: 09:47 Percent of Wound Bed Debrided: 100% T Area Debrided (cm): otal 0.12 Tissue and other material debrided: Viable, Non-Viable, Callus, Slough, Subcutaneous, Slough Level: Skin/Subcutaneous Tissue Debridement Description: Excisional Instrument: Curette Bleeding: Minimum Hemostasis Achieved:  Pressure [name], [name] ([id number]) [phone].pdf Page 2 of 21 Response to Treatment: Procedure was tolerated well Level of Consciousness (Post- Awake and Alert procedure): Post Debridement Measurements of Total Wound Length: (cm) 0.5 Width: (cm) 0.3 Depth: (cm) 0.2 Volume: (cm) 0.024 Character of Wound/Ulcer Post Debridement: Stable Severity of Tissue Post Debridement: Fat layer exposed Post Procedure Diagnosis Same as Pre-procedure Electronic Signature(s) Signed: 10/02/2023 4:09:48 PM By: [name] Signed: 10/02/2023 4:48:25 PM By: [name] Signed: 10/03/2023 3:25:36 PM By: [name] Signed: 10/04/2023 7:54:12 PM By: Heather Derry PA-C Entered By: [name] on 10/02/2023 09:50:20 -------------------------------------------------------------------------------- HPI Details Patient Name: Date of Service: [name] 10/02/2023 9:00 [name]: [id number] Patient Account Number: [id number] Date of Birth/Sex: Treating [name]: May 18, 1976 (47 y.o. [name] Primary Care Provider: Lindwood [name] Other Clinician: Betha [name] Referring Provider: Treating Provider/Extender: [name] in Treatment: 12 History of Present Illness HPI Description: 01/18/18-She is here for initial evaluation of the left great toe ulcer. She is [name] poor historian in regards to timeframe in detail. She states approximately 4 weeks ago she lacerated her toe on something in the house. She followed up with her primary care who placed her on Bactrim and ultimately [name] second dose of Bactrim prior to coming to wound clinic. She states she has been treating the toe with peroxide, Betadine and [name] Band-Aid. She did not check her blood sugar this morning but checked it yesterday morning it was 327; she is unaware of [name] recent A1c and there are no current records. She saw Dr. she would've orthopedics last week for an old injury to the left  ankle, she states he did not see her toe, nor did she bring it to his attention. She smokes approximately 1 pack cigarettes [name] day. Her social situation is concerning, she arrives this morning with her mother who appears extremely intoxicated/under the influence; her mother was asked to leave the room and be monitored by the patient's grandmother. The patient's aunt then accompanied the patient and the room  throughout the rest of the appointment. We had [name] lengthy discussion regarding the deleterious effects of uncontrolled hyperglycemia and smoking as it relates to wound healing and overall health. She was strongly encouraged to decrease her smoking and get her diabetes under better control. She states she is currently on [name] diet and has cut down her [facility] consumption. The left toe is erythematous, macerated and slightly edematous with malodor present. The edema in her left foot is below her baseline, there is no erythema streaking. We will treat her with Santyl, doxycycline; we have ordered and xray, culture and provided [name] Peg assist surgical shoe and cultured the wound. 01/25/18-She is here in follow-up evaluation for [name] left great toe ulcer and presents with an abscess to her suprapubic area. She states her blood sugars remain elevated, feeling "sick" and if levels are below 250, but she is trying. She has made no attempt to decrease her smoking stating that we "can't take away her food in her cigarettes". She has been compliant with offloading using the PEG assist you. She is using Santyl daily. the culture obtained last week grew staph aureus and Enterococcus faecalis; continues on the doxycycline and Augmentin was added on Monday. The suprapubic area has erythema, no femoral variation, purple discoloration, minimal induration, was accessed with [name] cotton tip applicator with sanguinopurulent drainage, this was cultured, I suspect the current antibiotic treatment will cover and we will not add  anything to her current treatment plan. She was advised to go to urgent care or ER with any change in redness, induration or fever. 02/01/18-She is here in follow-up evaluation for left great toe ulcers and [name] new abdominal abscess from last week. She was able to use packing until earlier this week, where she "forgot it was there". She states she was feeling ill with GI symptoms last week and was not taking her antibiotic. She states her glucose levels have been predominantly less than 200, with occasional levels between 200-250. She thinks this was contributing to her GI symptoms as they have resolved without intervention. There continues to be significant laceration to left toe, otherwise it clinically looks stable/improved. There is now less superficial opening to the lateral aspect of the great toe that was residual blister. We will transition to [facility] to all wounds, she will continue her Augmentin. If there is no change or deterioration next week for reculture. 02/08/18-She is here in follow-up evaluation for left great toe ulcer and abdominal ulcer. There is an improvement in both wounds. She has been wrapping her left toe with coban, not by our direction, which has created an area of discoloration to the medial aspect; she has been advised to NOT use coban secondary to her neuropathy. She states her glucose levels have been high over this last week ranging from 200-350, she continues to smoke. She admits to being less compliant with her offloading shoe. We will continue with same treatment plan and she will follow-up next week. 02/15/18-She is here in follow-up evaluation for left great toe ulcer and abdominal ulcer. The abdominal ulcer is epithelialized. The left great toe ulcer is improved and all injury from last week using the Coban wrap is resolved, the lateral ulcer is healed. She admits to noncompliance with wearing offloading shoe and admits to glucose levels being greater than 300  most of the week. She continues to smoke and expresses no desire to quit. There is one area medially that Boch, [name] ([id number]) [phone].pdf Page 3 of 21  probes deeper than it has historically, erythema to the toe and dorsal foot has consistently waxed and waned. There is no overt signs of cellulitis or infection but we will culture the wound for any occult infection given the new area of depth and erythema. We will hold off on sensitivities for initiation of antibiotic therapy. 02/22/18-She is here in follow up evaluation for left great toe ulcer. There is overall significant improvement in both wound appearance, erythema and edema with changes made last week. She was not initiated on antibiotic therapy. Culture obtained last week showed oxacillin sensitive staph aureus, sensitive to clindamycin. Clindamycin has been called into the pharmacy but she has been instructed to hold off on initiation secondary to overall clinical improvement and her history of antibiotic intolerance. She has been instructed to contact the clinic with any noted changes/deterioration and the wound, erythema, edema and/or pain. She will follow-up next week. She continues to smoke and her glucose levels remain elevated >250; she admits to compliance with offloading shoe 03/01/18 on evaluation today patient appears to be doing fairly well in regard to her left first toe ulcer. She has been tolerating the dressing changes with the [facility] Dressing without complication and overall this has definitely showed signs of improvement according to records as well is what the patient tells me today. I'm very pleased in that regard. She is having no pain today 03/08/18 She is here for follow up evaluation of [name] left great toe ulcer. She remains non-compliant with glucose control and smoking cessation; glucose levels consistently >200. She states that she got new shoe inserts/peg assist. She admits to  compliance with offloading. Since my last evaluation there is significant improvement. We will switch to prisma at this time and she will follow up next week. She is noted to be tachycardic at this appointment, heart rate 120s; she has [name] history of heart rate 70-130 according to our records. She admits to extreme agitation r/t personal issues; she was advised to monitor her heartrate and contact her physician if it does not return to [name] more normal range (<100). She takes cardizem twice daily. 03/15/18-She is here in follow-up evaluation for left great toe ulcer. She remains noncompliant with glucose control and smoking cessation. She admits to compliance with wearing offloading shoe. The ulcer is improved/stable and we will continue with the same treatment plan and she will follow-up next week 03/22/18-She is here for evaluation for left great toe ulcer. There continues to be significant improvement despite recurrent hyperglycemia (over 500 yesterday) and she continues to smoke. She has been compliant with offloading and we will continue with same treatment plan and she will follow-up next week. 03/29/18-She is here for evaluation for left great toe ulcer. Despite continuing to smoke and uncontrolled diabetes she continues to improve. She is compliant with offloading shoe. We will continue with the same treatment plan and she will follow-up next week 04/05/18- She is here in follow up evaluation for [name] left great toe ulcer; she presents with small pustule to left fifth toe (resembles ant bite). She admits to compliance with wearing offloading shoe; continues to smoke or have uncontrolled blood glucose control. There is more callus than usual with evidence of bleeding; she denies known trauma. 04/12/18-She is here for evaluation of left great toe ulcer. Despite noncompliance with glycemic control and smoking she continues to make improvement. She continues to wear offloading shoe. The pustule, that was  identified last week, to the left fifth toe is resolved.  She will follow-up in 2 weeks 05/03/18-she is seen in follow-up evaluation for [name] left great toe ulcer. She is compliant with offloading, otherwise noncompliant with glycemic control and smoking. She has plateaued and there is minimal improvement noted. We will transition to [facility], replaced the insert to her surgical shoe and she will follow-up in one week 05/10/18- She is here in follow up evaluation for [name] left great toe ulcer. It appears stable despite measurement change. We will continue with same treatment plan and follow up next week. 05/24/18-She is seen in follow-up evaluation for [name] left great toe ulcer. She remains compliant with offloading, has made significant improvement in her diet, decreasing the amount of sugar/soda. She said her recent A1c was 10.9 which is lower than. She did see [name] diabetic nutritionist/educator yesterday. She continues to smoke. We will continue with the same treatment plan and she'll follow-up next week. 05/31/18- She is seen in follow-up evaluation for left great toe ulcer. She continues to remain compliant with offloading, continues to make improvement in her diet, increasing her water and decreasing the amount of sugar/soda. She does continue to smoke with no desire to quit. We will apply Prisma to the depth and Hydrofera Blue over. We have not received insurance authorization for oasis. She will follow up next week. 06/07/18-She is seen in follow-up evaluation for left great toe ulcer. It has stalled according to today's measurements although base appears stable. She says she saw [name] diabetic educator yesterday; her average blood sugars are less than 300 which is an improvement for her. She continues to smoke and states "that's my next step" She continues with water over soda. We will order for xray, culture and reinstate ace wrap compression prior to placing apligraf for next week. She is voicing no  complaints or concerns. Her dressing will change to iodoflex over the next week in preparation for apligraf. 06/14/18-She is seen in follow-up evaluation for left great toe ulcer. Plain film x-ray performed last week was negative for osteomyelitis. Wound culture obtained last week grew strep B and OSSA; she is initiated on keflex and cefdinir today; there is erythema to the toe which could be from ace wrap compression, she has [name] history of wrapping too tight and has has been encouraged to maintain ace wraps that we place today. We will hold off on application of apligraf today, will apply next week after antibiotic therapy has been initiated. She admits today that she has resumed taking [name] shower with her foot/toe submerged in water, she has been reminded to keep foot/toe out of the bath water. She will be seen in follow up next week 06/21/18-she is seen in follow-up evaluation for left great toe ulcer. She is tolerating antibiotic therapy with no GI disturbance. The wound is stable. Apligraf was applied today. She has been decreasing her smoking, only had 4 cigarettes yesterday and 1 today. She continues being more compliant in diabetic diet. She will follow-up next week for evaluation of site, if stable will remove at 2 weeks. 06/28/18- She is here in follow up evalution. Apligraf was placed last week, she states the dressing fell off on Tuesday and she was dressing with hydrofera blue. She is healed and will be discharged from the clinic today. She has been instructed to continue with smoking cessation, continue monitoring glucose levels, offloading for an additional 4 weeks and continue with hydrofera blue for additional two weeks for any possible microscopic opening. Readmission: 08/07/18 on evaluation today patient presents for  reevaluation concerning the ulcer of her right great toe. She was previously discharged on 06/28/18 healed. Nonetheless she states that this began to show signs of drainage she  subsequently went to her primary care provider. Subsequently an x-ray was performed on 08/01/18 which was negative. The patient was also placed on antibiotics at that time. Fortunately they should have been effective for the infection. Nonetheless she's been experiencing some improvement but still has [name] lot of drainage coming from the wound itself. 08/14/18 on evaluation today patient's wound actually does show signs of improvement in regard to the erythema at this point. She has completed the antibiotics. With that being said we did discuss the possibility of placing her in [name] total contact cast as of today although I think that I may want to give this just [name] little bit more time to ensure nothing recurrence as far as her infection is concerned. I do not want to put in the cast and risk infection at that time if things are not completely resolved. With that being said she is gonna require some debridement today. 08/21/18 on evaluation today patient actually appears to be doing okay in regard to her toe ulcer. She's been tolerating the dressing changes without complication. With that being said it does appear that she is ready and in fact I think it's appropriate for [place] to go ahead and initiate the total contact cast today. Nonetheless she will require some sharp debridement to prepare the wound for application. Overall I feel like things have been progressing well but we do need to do something to get this to close more readily. 08/24/18 patient seen today for reevaluation after having had the total contact cast applied on Tuesday. She seems to have done very well the wound appears to be doing great and overall I'm pleased with the progress that she's made. There were no abnormal areas of rubbing from the cast on her lower extremity. 08/30/18 on evaluation today patient actually appears to be completely healed in regard to her plantar toe ulcer. She tells me at this point she's been having [name] lot of issues  with the cast. She almost fell [name] couple of times the state shall the step of her dog [name] couple times as well. This is been [name] very frustrating process for her other nonetheless she has completely healed the wound which is excellent news. Overall there does not appear to be the evidence of infection at this time which is great news. 09/11/18 evaluation today patient presents for follow-up concerning her great toe ulcer on the left which has unfortunately reopened since I last saw her which was only [name] couple of weeks ago. Unfortunately she was not able to get in to get the shoe and potentially the AFO that's gonna be necessary due to her left foot drop. She continues with offloading shoe but this is not enough to prevent her from reopening it appears. When we last had her in the total contact cast she did well from [name] healing standpoint but unfortunately the wound reopened as soon as she came out of the cast within just [name] couple of weeks. 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 This is also part of the reason why she falls on [name] regular basis. I just  do not believe that is gonna be safe for [place] to reinitiate the total contact cast as last time we had this on she fell 3 times one week which is definitely not normal for her. 09/18/18 upon evaluation today the patient actually appears to be doing about the same in regard to her toe ulcer. She did not contact [facility] as I asked her to even though I had given her the prescription. In fact she actually states that she has no idea where the prescription is. She did apparently call [employer] and they told her that all she needed to do was bring the prescription in order to be able to be seen and work on getting the AFO for her left foot. With all that being [name], Heather  [name] ([id number]) [id number] Page 4 of 21 said she still does not have an appointment and I'm not sure were things stand that regard. I will give her [name] new prescription today in order to contact them to get this set up. 09/25/18 on evaluation today patient actually appears to be doing about the same in regard to her toes ulcer. She does have [name] small areas which seems to have [name] lot of callous buildup around the edge of the wound which is going to need sharp debridement today. She still is waiting to be scheduled for evaluation with [facility] for possibility of an AFO. She states there supposed to call her tomorrow to get this set up. Unfortunately it does appear that her foot specifically the toe area is showing signs of erythema. There does not appear to be any systemic infection which is in these good news. 10/02/18 on evaluation today patient actually appears to be doing about the same in regard to her toe ulcer. This really has not done too well although it's not significantly larger it's also not significantly smaller. She has been tolerating the dressing changes without complication. She actually has her appointment with [facility] and [facility] tomorrow to hopefully be measured for obtaining and AFO splint. I think this would be helpful preventing this from reoccurring. We had contemplated starting the cast this week although to be honest I am reluctant to do that as she's been having nausea, vomiting, and seizure activity over the past three days. She has [name] history of seizures and have been told is nothing that can be done for these. With that being said I do believe that along with the seizures have the nausea vomiting which upon further questioning doesn't seem to be the normal for her and makes me concerned for the possibility of infection or something else going on. I discussed this with the patient and her mother during the office visit today. I do not feel the wound is  effective but maybe something else. The responses this was "this just happens to her at times and we don't know why". They did not seem to be interested in going to the hospital to have this checked out further. 10/09/18 on evaluation today patient presents for follow-up concerning her ongoing toe ulcer. She has been tolerating the dressing changes without complication. Fortunately there does not appear to be any evidence of infection which is great news however I do think that the patient would benefit from going ahead for with the total contact cast. She's actually in [name] wheelchair today she tells me that she will use her walker if we initiate the cast. I was very specific about the fact that if we were gonna do the cast I  wanted to make sure that she was using the walker in order to prevent any falls. She tells me she does not have stairs that she has to traverse on [name] regular basis at her home. She has not had any seizures since last week again that something that happens to her often she tells me she did talk to [employer] and they said that it may take up to three weeks to get the brace approved for her. Hopefully that will not take that long but nonetheless in the meantime I do think the cast could be of benefit. 10/12/18 on evaluation today patient appears to be doing rather well in regard to her toe ulcer. It's just been [name] few days and already this is significantly improved both as far as overall appearance and size. Fortunately there's no sign of infection. She is here for her first obligatory cast change. 10/19/18 Seen today for follow up and management of left great toe ulcer. Wound continues to show improvement. Noted small open area with seroussang drainage with palpation. Denies any increased pain or recent fevers during visit. She will continue calcium alginate with offloading shoe. Denies any questions or concerns during visit. 10/26/18 on evaluation today patient appears to be doing about  the same as when I last saw her in regard to her wound bed. Fortunately there does not appear to be any signs of infection. Unfortunately she continues to have [name] breakdown in regard to the toe region any time that she is not in the cast. It takes almost no time at all for this to happen. Nonetheless she still has not heard anything from the brace being made by [employer] as to when exactly this will be available to her. Fortunately there is no signs of infection at this time. 10/30/18 on evaluation today patient presents for application of the total contact cast as we just received him this morning. Fortunately we are gonna be able to apply this to her today which is great news. She continues to have no significant pain which is good news. Overall I do feel like things have been improving while she was the cast is when she doesn't have [name] cast that things get worse. She still has not really heard anything from [employer] regarding her brace. 11/02/18 upon evaluation today patient's wound already appears to be doing significantly better which is good news. Fortunately there does not appear to be any signs of infection also good news. Overall I do think the total contact cast as before is helping to heal this area unfortunately it's just not gonna likely keep the area closed and healed without her getting her brace at least. Again the foot drop is [name] significant issue for her. 11/09/18 on evaluation today patient appears to be doing excellent in regard to her toe ulcer which in fact is completely healed. Fortunately we finally got the situation squared away with the paperwork which was needed to proceed with getting her brace approved by Medicaid. I have filled that out unfortunately that information has been sent to the orthopedic office that I worked at 2 1/2 years ago and not tired Current wound care measures. Fortunately she seems to be doing very well at this time. 11/23/18 on evaluation today patient appears to  be doing More Poorly Compared to Last Time I Saw Her. At [facility] She Had Completely Healed. Currently she is continuing to have issues with reopening. She states that she just found out that the brace was approved through  Medicaid now she just has to go get measured in order to have this fitted for her and then made. Subsequently she does not have an appointment for this yet that is going to complicate things we obviously cannot put her back in the cast if we do not have everything measured because they're not gonna be able to measure her foot while she is in the cast. 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 Obviously this is unfortunate and does have me somewhat worried as well. 11/30/18 on evaluation today patient's toe ulcer actually appears to be doing fairly well. The good news is she will be getting her brace in the shoes next week on Wednesday. Hopefully we will be able to get this to heal without having to go back in the cast however she may need the cast in order to get the wound completely heal and then go from there. Fortunately there's no signs of infection at this time. 12/07/18 on evaluation today patient fortunately did receive her brace and she states she could tell this definitely makes her walk better. With that being said she's been having issues with her toe where she noticed yesterday there was [name] lot of tissue that was loosing off this appears to be much larger than what it was previous. She also states that her leg has been read putting much across the top of her foot just about the ankle although this seems to be receiving somewhat. The total area is still red and appears to be someone infected as best I can tell. She is previously taken Bactrim and that may be [name] good option for her today as well. We are gonna see what I wound culture shows as well and I think that this is  definitely appropriate. With that being said outside of the culture I still need to initiate something in the interim and that's what I'm gonna go ahead and select Bactrim is [name] good option for her. 12/14/18 on evaluation today patient appears to be doing better in regard to her left great toe ulcer as compared to last week's evaluation. There's still some erythema although this is significantly improved which is excellent news. Overall I do believe that she is making good progress is still gonna take some time before she is where I would like her to be from the standpoint of being able to place her back into the total contact cast. Hopefully we will be where we need to be by next week. 12/21/18 on evaluation today patient actually appears to be doing poorly in regard to her toe ulcer. She's been tolerating the dressing changes without complication. Fortunately there's no signs of systemic infection although she does have [name] lot of drainage from the toe ulcer and this does seem to be causing some issues at this point. She does have erythema on the distal portion of her toe that appears to be likely cellulitis. 12/28/18 on evaluation today patient actually appears to be doing [name] little better in my pinion in regard to her toe ulcer. With that being said she still does have some evidence of infection at this time and for her culture she had both E. coli as well as enterococcus as organisms noted on evaluation. For that reason I think that though the Keflex likely has treated the E. coli rather well this has really done nothing for the enterococcus.  We are going to have to initiate treatment for this specifically. 01/04/19 on evaluation today patient's toe actually appears to be doing better from the standpoint of infection. She currently would like to see about putting the cash back on I think that this is appropriate as long as she takes care of it and keeps it from getting wet. She is gonna have some drainage  we can definitely pass this up with Drawtex and alginate to try to prevent as much drainage as possible from causing the problems. With that being said I do want to at least try her with the cast between now and Tuesday. If there any issues we can't continue to use it then I will discontinue the use of the cast at that point. 01/08/19 on evaluation today patient actually appears to be doing very well as far as her foot ulcer specifically the great toe on the left is concerned. She did have an area of rubbing on the medial aspect of her left ankle which again is from the cast. Fortunately there's no signs of infection at this point in this appears to be [name] very slight skin breakdown. The patient tells me she felt it rubbing but didn't think it was that bad. Fortunately there is no signs of active infection at this time which is good news. No fevers, chills, nausea, or vomiting noted at this time. [name], [name] ([id number]) [id number] Page 5 of 21 01/15/19 on evaluation today patient actually appears to be doing well in regard to her toe ulcer. Again as previous she seems to do well and she has the cast on which indicates to me that during the time she doesn't have [name] cast on she's putting way too much pressure on this region. Obviously I think that's gonna be an issue as with the current national emergency concerning the Covid-19 Virus it has been recommended that we discontinue the use of total contact casting by the chief medical officer of our company, Dr. Maurine Minister. The reasoning is that if [name] patient becomes sick and cannot come into have the cast removed they could not just leave this on for an additional two weeks. Obviously the hospitals also do not want to receive patient's who are sick into the emergency department to potentially contaminate the region and spread the Covid-19 Virus among other sick individuals within the hospital system. Therefore at this point we  are suspending the use of total contact cast until the current emergency subsides. This was all discussed with the patient today as well. 01/22/19 on evaluation today patient's wound on her left great toe appears to be doing slightly worse than previously noted last week. She tells me that she has been on this quite [name] bit in fact she tells me she's been awake for 38 straight hours. This is due to the fact that she's having to care for grandparents because nobody else will. She has been taking care of them for five the last seven days since I've seen her they both have dementia his is from [name] stroke and her grandmother's was progressive. Nonetheless she states even her mom who knows her condition and situation has only help two of those days to take care of them she's been taking care of the rest. Fortunately there does not appear to be any signs of active infection in regard to her toe at this point although obviously it doesn't look as good as it did previous. I think this is directly related to her not  taking off the pressure and friction by way of taking things easy. Though I completely understand what's going on. 01/29/19 on evaluation today patient's tools are actually appears to be showing some signs of improvement today compared to last week's evaluation as far as not necessarily the overall size of the wound but the fact that she has some new skin growth in between the two ends of the wound opening. Overall I feel like she has done well she states that she had [name] family member give her what sounds to be [name] CAM walker boot which has been helpful as well. 02/05/19 on evaluation today patient's wound bed actually appears to be doing significantly better in regard to her overall appearance of the size of the wound. With that being said she is still having an issue with offloading efficiently enough to get this to close. Apparently there is some signs of infection at this point as well unfortunately.  Previously she's done well of Augmentin I really do not see anything that needs to be culture currently but there theme and cellulitis of the foot that I'm seeing I'm gonna go ahead and place her on an antibiotic today to try to help clear this up. 02/12/2019 on evaluation today patient actually appears to be doing poorly in regard to her overall wound status. She tells me she has been using her offloading shoe but actually comes in today wearing her tennis shoe with the AFO brace. Again as I previously discussed with her this is really not sufficient to allow the area to heal appropriately. Nonetheless she continues to be somewhat noncompliant and I do wonder based on what she has told my nurse in the past as to whether or not [name] good portion of this noncompliance may be recreational drug and alcohol related. She has had [name] history of heroin overdose and this was fairly recently in the past couple of months that have been seeing her. Nonetheless overall I feel like her wound looks significantly worse today compared to what it was previous. She still has significant erythema despite the Augmentin I am not sure that this is an appropriate medication for her infection I am also concerned that the infection may have gone down into her bone. 02/19/19 on evaluation today patient actually appears to be doing about the same in regard to her toe ulcer. Unfortunately she continues to show signs of bone exposure and infection at this point. There does not appear to be any evidence of worsening of the infection but I'm also not really sure that it's getting significantly better. She is on the Augmentin which should be sufficient for the Staphylococcus aureus infection that she has at this point. With that being said she may need IV antibiotics to more appropriately treat this. We did have [name] discussion today about hyperbaric option therapy. 02/28/19 on evaluation today patient actually appears to be doing much worse in  regard to the wound on her left great toe as compared to even my previous evaluation last week. Unfortunately this seems to be training in [name] pretty poor direction. Her toe was actually now starting to angle laterally and I can actually see the entire joint area of the proximal portion of the digit where is the distal portion of the digit again is no longer even in contact with the joint line. Unfortunately there's [name] lot more necrotic tissue around the edge and the toe appears to be showing signs of becoming gangrenous in my pinion. I'm very concerned  about were things stand at this point. She did see infectious disease and they are planning to send in [name] prescription for Sivextro for her and apparently this has been approved. With that being said I don't think she should avoid taking this but at the same time I'm not sure that it's gonna be sufficient to save her toe at this point. She tells me that she still having to care for grandparents which I think is putting quite [name] bit of strain on her foot and specifically the total area and has caused this to break down even to [name] greater degree than would've otherwise been expected. 03/05/19 on evaluation today patient actually appears to be doing quite well in regard to her toe all things considering. She still has bone exposed but there appears to be much less your thing on overall the appearance of the wound and the toe itself is dramatically improved. She still does have some issues currently obviously with infection she did see vascular as well and there concerned that her blood flow to the toad. For that reason they are setting up for an angiogram next week. 03/14/19 on evaluation today patient appears to be doing very poor in regard to her toe and specifically in regard to the ulceration and the fact that she's starting to notice the toe was leaning even more towards the lateral aspect and the complete joint is visible on the proximal aspect of the joint.  Nonetheless she's also noted [name] significant odor and the tip of the toe is turning more dark and necrotic appearing. Overall I think she is getting worse not better as far as this is concerned. For that reason I am recommending at this point that she likely needs to be seen for likely amputation. READMISSION 03/19/2021 This is [name] patient that we cared for in this clinic for [name] prolonged period of time in 2019 and 2020 with [name] left foot and left first toe wound. 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 Excell Seltzer. In December 2021 she had an ulcer on her right great toe as well as the fourth and fifth toes. 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 She also had an angiogram at that time and underwent angioplasty of the right anterior tibial artery. In any case she claims that the wound on the right foot is closed I did not look at this today which was probably an oversight although I think that should be done next week. 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 According to Dr. Bernette Redbird last review that she was out of the area being cared for by another physician but recently came back to his attention. 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 [name] culture of this area showed E. coli apparently before she came back to see Dr. Excell Seltzer she was supposed to be receiving antibiotics but she did not really take them. Nor is she offloading this area at all. Finally her last hemoglobin A1c listed in epic was in March 2022 at 14.1 she says things are [name] lot better since then although I am not sure. 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 She had this wound at the time. During this admission she had E. coli  sepsis [name] TEE was  negative. 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 She is still [name] heavy cigarette smoker. She has [name] history of bipolar disorder seizure disorder ABI in our clinic was 1.05 6/1; left midfoot in the setting of [name] TMA done previously. Round circular wound with [name] "knuckle" of protruding tissue. The problem is that the knuckle was not attached to any of the surrounding granulation and this probed proximally widely I removed [name] large portion of this tissue. This wound goes with considerable undermining laterally. I do not feel any bone there was no purulence but this is [name] deep wound. 6/8; in spite of the debridement I did last week. She arrives with [name] wound looking exactly the same. [name] protruding "knuckle" of tissue nonadherent to most of the surrounding tissue. There is considerable depth around this from 6-12 o'clock at 2.7 cm and undermining of 1 cm. This does not look overtly infected and the x- ray I did last week was negative for any osseous abnormalities. We have been using silver collagen 6/15; deep tissue culture I did last week showed moderate staph aureus and moderate Pseudomonas. This will definitely require prolonged antibiotic therapy. The pathology on the protuberant area was negative for malignancy fungus etc. 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 We have been using silver collagen. I am going to be prescribing Levaquin for 2 weeks. Her CT scan of the foot is down for 7/5 6/22; CT scan of the foot on 7 5. She says she has hardware in the left leg from her previous fracture. She is on the Levaquin for the deep tissue culture I did that showed methicillin sensitive staph aureus  and Pseudomonas. I gave her [name] 2-week supply and she will have another week. She arrives in clinic today with the same protuberant tissue however this is nonadherent to the tissue surrounding it. I am really at [name] loss to explain this unless there is underlying deep tissue EMMACLAIRE, SHUBA [name] ([id number]) [phone].pdf Page 6 of 21 infection 6/29; patient presents for 1 week follow-up. She has been using collagen to the wound bed. She reports taking her antibiotics as prescribed.She has no complaints or issues today. She denies signs of infection. 7/6; patient presents for one week followup. She has been using collagen to the wound bed. She states she is taking Levaquin however at times she is not able to keep it down. She denies signs of infection. 7/13; patient presents for 1 week follow-up. She has been using silver alginate to the wound bed. She still has nausea when taking her antibiotics. She denies signs of infection. 7/20; patient presents for 1 week follow-up. She has been using silver alginate with gentamicin cream to the wound bed. She denies any issues and has no complaints today. She denies signs of infection. 7/27; patient presents for 1 week follow-up. She continues to use silver alginate with gentamicin cream to the wound bed. She reports starting her antibiotics. She has no issues or complaints. Overall she reports stability to the wound. 8/3; patient presents for 1 week follow-up. She has been using silver alginate with gentamicin cream to the wound bed. She reports completing all antibiotics. She has no issues or complaints today. She denies signs of  infection. 8/17; patient presents for 2-week follow-up. He is to use silver alginate to the wound bed. She has no issues or complaints today. She denies signs of infection. She reports her pain has improved in her foot since last clinic visit 8/24; patient presents for 1 week follow-up. She continues to use silver  alginate to the wound bed. She has no issues or complaints. She denies signs of infection. Pain is stable. 9/7; patient presents for follow-up. She missed her last week appointment due to feeling ill. She continues to use silver alginate. She has [name] new wound to the right lower extremity that is covered in eschar. She states It occurred over the past week and has no idea how it started. She currently denies signs of infection. 9/14; patient presents for follow-up. T the left foot wound she has been using gentamicin cream and silver alginate. T the right lower extremity wound she has o o been keeping this covered and has not obtain Santyl. 9/21; patient presents for follow-up. She reports using gentamicin cream and silver alginate to the left foot and Santyl to the right lower extremity wound. She has no issues or complaints today. She denies signs of infection. 9/28; patient presents for follow-up. She reports [name] new wound to her right heel. She states this occurred [name] few days ago and is progressively gotten worse. She has been trying to clean the area with [name] Q-tip and Santyl. She reports stability in the other 2 wounds. She has been using gentamicin cream and silver alginate to the left foot and Santyl to the right lower extremity wound. 10/12; patient presents for follow-up. She reports improvement to the wound beds. She is seeing vein and vascular to discuss the potential of [name] left BKA. She states they are going to do an arteriogram. She continues to use silver alginate with dressing changes to her wounds. 11/2; patient presents for follow-up. She states she has not been doing dressing changes to the wound beds. She states she is not able to offload the areas. She reports chronic pain to her left foot wound. 11/9; patient presents for follow-up. She came in with only socks on. She states she forgot to put on shoes. It is unclear if she is doing any dressing changes. She currently denies systemic  signs of infection. 11/16; patient presents for follow-up. She came again only with socks on. She states she does not wear shoes ever. It is unclear if she does dressing changes. She currently denies systemic signs of infection. 11/23; patient presents for follow-up. She wore her shoes today. It still unclear exactly what dressing she is using for each wound but she did states she obtained Dakin's solution and has been using this to the left foot wound. She currently denies signs of infection. 11/30; patient presents for follow-up. She has no issues or complaints today. She currently denies signs of infection. 12/7; patient presents for follow-up. She has no issues or complaints today. She has been using Hydrofera Blue to the right heel wound and Dakin solution to the left foot wound. Her right anterior leg wound is healed. She currently denies signs of infection. 12/14; patient presents for follow-up. She has been using Hydrofera Blue to the right heel and Dakin's to the left foot wounds. She has no issues or complaints today. She denies signs of infection. 12/21; patient presents for follow-up. She reports using Hydrofera Blue to the right heel and Dakin's to the left foot wound. She denies signs of infection.  12/28; patient presents for follow-up. She continues to use Dakin's to the left foot wound and Hydrofera Blue to the right heel wound. She denies signs of infection. 1/4; patient presents for follow-up. She has no issues or complaints today. She denies signs of infection. 1/11; patient presents for follow-up. It is unclear if she has been dressing these wounds over the past week. She currently denies signs of infection. 1/18; patient presents for follow-up. She states she has been using Dakin's wet-to-dry dressings to the left foot. She has been using Hydrofera Blue to the right foot foot wound. She states that the anterior right leg wound has reopened and draining serous fluid. She denies  signs of infection. 1/25; patient presents for follow-up. She has no issues or complaints today. 2/1; patient presents for follow-up. She has no issues or complaints today. She denies signs of infection. 2/8; patient presents for follow-up. She has lost her surgical shoes. She did not have [name] dressing to the right heel wound. She currently denies signs of infection. 2/15; patient presents for follow-up. She reports more pain to the right heel today. She denies purulent drainage Or fever/chills 2/22; patient presents for follow-up. She reports taking clindamycin over the past week. She states that she continues to have pain to her right heel. She reports purulent drainage. Readmission 03/16/2022 Ms. Rozlin Trezise is [name] 47 year old female with [name] past medical history of type 2 diabetes, osteomyelitis to her feet, chronic systolic heart failure and bipolar disorder that presents to the clinic for bilateral feet wounds and right lower extremity wound. She was last seen in our clinic on 12/15/2021. At that time she had purulent drainage coming out of her right plantar foot and I recommended she go to the ED. She states she went to [facility] and has been there for the past 3 months. I cannot see the records. She states she had OR debridement and was on several weeks of IV antibiotics while inpatient. Since discharge she has not been taking care of the wound beds. She had nothing on her feet other than socks today. She currently denies signs of infection. 5/31; patient presents for follow-up. She has been using Dakin's wet-to-dry dressings to the wound beds on her feet bilaterally and antibiotic ointment to the right anterior leg wound. She had [name] wound culture done at last clinic visit that showed moderate Pseudomonas aeruginosa sensitive to ciprofloxacin. She currently denies systemic signs of infection. 6/14; patient presents for follow-up. She received Keystone 5 days ago and has been using  this on the wound beds. She states that last week she had to go to the hospital because she had increased warmth and erythema to the right foot. She was started on 2 oral antibiotics. She states she has been taking these. She currently denies systemic signs of infection. She has no issues or complaints today. 6/21; patient presents for follow-up. She states she has been using Keystone antibiotics to the wound beds. She has no issues or complaints today. She denies ETTA, TAIBI [name] ([id number]) [id number] Page 7 of 21 signs of infection. 6/28; patient presents for follow-up. She has been using Keystone antibiotics to the wound beds. She has no issues or complaints today. 7/12; patient presents for follow-up. Has been using Keystone antibiotics to the wound beds with calcium alginate. She has no issues or complaints today. She never followed up with her orthopedic surgeon who did the OR debridement to the right foot. We discussed the total contact cast for  the left foot and patient would like to do this next week. 7/19; patient presents for follow-up. She has been using Keystone antibiotics with calcium alginate to the wound beds. She has no issues or complaints today. Patient is in agreement to do the total contact cast of the left foot today. She knows to return later this week for the obligatory cast change. 05-13-2022 upon evaluation today patient's wound which she has the cast of the left leg actually appears to be doing significantly better. Fortunately I do not see any signs of active infection locally or systemically which is great news and overall I am extremely pleased with where we stand currently. 7/26; patient presents for follow-up. She has [name] cast in place for the past week. She states it irritated her shin. Other than that she tolerated the cast well. She states she would like [name] break for 1 week from the cast. We have been using Keystone antibiotic and Aquacel  to both wound beds. She denies signs of infection. 8/2; patient presents for follow-up. She has been using Keystone and Aquacel to the wound beds. She denies any issues and has no complaints. She is agreeable to have the cast placed today for the left leg. 06-03-2022 upon evaluation today patient appears to be doing well with regard to her wound she saw some signs of improvement which is great news. Fortunately I do not see any evidence of active infection locally or systemically at this time which is great news. No fevers, chills, nausea, vomiting, or diarrhea. 8/16; patient presents for follow-up. She has no issues or complaints today. We have been using Keystone and Aquacel to the wound beds. The left lower extremity is in [name] total contact cast. She is tolerated this well. 8/23; patient presents for follow-up. She has had the total contact cast on the left leg for the past week. Unfortunately this has rubbed and broken down the skin to the medial foot. She currently denies signs of infection. She has been using Keystone antibiotic to the right plantar foot wound. 8/30; patient presents for follow-up. We have held off on the total contact cast for the left leg for the past week. Her wound on the left foot has improved and the previous surrounding breakdown of skin has epithelialized. She has been using Keystone antibiotic to both wound beds. She has no issues or complaints today. She denies signs of infection. 9/6; patient presents for follow-up. She has ordered her's Keystone antibiotic and this is arriving this week. She has been doing Dakin's wet-to-dry dressings to the wound beds. She denies signs of infection. She is agreeable to the total contact cast today. 9/13; patient presents for follow-up. She states that the cast caused her left leg shin to be sore. She would like to take [name] break from the cast this week. She has been using Keystone antibiotic to the right plantar foot wound. She denies  signs of infection. 9/20; patient presents for follow-up. She has been using Keystone antibiotics to the wound beds with calcium alginate to the right foot wound and Hydrofera Blue to the left foot wound. She is agreeable to having the cast placed today. She has been approved for Apligraf and we will order this for next clinic visit. 9/27; patient presents for follow-up. We have been using Keystone antibiotic with Hydrofera Blue to the left foot wound under [name] total contact cast. T the right o foot wound she has been using Keystone antibiotic and calcium alginate. She declines [name]  total contact cast today. Apligraf is available for placement and she would like to proceed with this. 07-28-2022 upon evaluation today patient appears to be doing well currently in regard to her wound. She is actually showing signs of significant improvement which is great news. Fortunately I do not see any evidence of active infection locally nor systemically at this time. She has been seeing Dr. Mikey Bussing and to be honest has been doing very well with the cast. Subsequently she comes in today with [name] cast on and we did reapply that today as well. She did not really want to she try to talk me out of that but I explained that if she wanted to heal this is really the right way to go. Patient voiced understanding. In regard to her right foot this is actually [name] lot better compared to the last time I saw her which is also great news. 10/11; patient presents for follow-up. Apligraf and the total contact cast was placed to the left leg at last clinic visit. She states that her right foot wound had burning pain to it with the placement of Apligraf to this area. She has been doing [employer] over this area. She denies signs of infection including increased warmth, erythema or purulent drainage. 11/1; 3-week follow-up. The patient fortunately did not have [name] total contact cast or an Apligraf and on the left foot. She has been using Keystone  ABD pads and kerlix and her own running shoes She arrives in clinic today with thick callus and [name] very poor surface on the left foot on the right nonviable skin subcutaneous tissue and [name] deep probing hole. 11/15; patient missed her last clinic appointment. She states she has not been dressing the wound beds for the past 2 weeks. She states that at she had [name] new roommate but is now going back to live with her mother. Apparently its been [name] distracting 2 weeks. Patient currently denies signs of infection. 11/22; patient presents for follow-up. She states she has been using Keystone antibiotic and Dakin's wet-to-dry dressings to the wound beds. She is agreeable for cast placement today. We had ordered Apligraf however this has not been received by our facility. 11/29; Patient had [name] total contact cast placed at last clinic visit and she tolerated this well. We were using silver alginate under the cast. Patient's been using Keystone antibiotic with Aquacel to the right plantar foot wound. She has no issues or complaints today. Apligraf is available for placement today. Patient would like to proceed with this. 12/6; patient presents for follow-up. She had Apligraf placed in standard fashion last clinic visit under the total contact cast to the left lower extremity. She has been using Keystone antibiotic and Aquacel to the right plantar foot wound. She has no issues or complaints today. 12/13; patient presents for follow-up. She has finished 5 Apligraf placements. Was told she would not qualify for more. We have been doing [name] total contact cast to the left lower extremity. She has been using Keystone antibiotic and Aquacel to the right plantar foot wound. She has no issues or complaints today. 12/20; patient presents for follow-up. We have been using Hydrofera Blue with Keystone antibiotic under [name] total contact cast of the left lower extremity. She reports using Keystone antibiotic and silver alginate to the  right heel wound. She has no issues or complaints today. 12/27; patient presents with [name] healthy wound on the left midfoot. We have Apligraf to apply that to that more also using  [name] total contact cast. On the right we are using Keystone and silver alginate. She is offloading the right heel with [name] surgical shoealthough by her admission she is on her feet quite [name] bit 1/3; patient presents for follow-up. Apligraf was placed to the wound beds last clinic visit. She was placed in [name] total contact cast to the left lower extremity. She declines [name] total contact cast today. She states that her mother is in the hospital and she cannot adequately get around with the cast on. 1/10; patient presents for follow-up. She declined the total contact cast at last clinic visit. Both wounds have declined in appearance. She states that she has been on her feet and not offloading the wound beds. She currently denies signs of infection. 1/17; patient presents for follow-up. She had the total contact cast along with Apligraf placed last week to the left lower extremity. She tolerated this well. She LYNLIE, LACROIX [name] ([id number]) [id number] Page 8 of 21 has been using Aquacel Ag and Keystone antibiotic to the right heel wound. She currently denies signs of infection. She has no issues or complaints today. 1/24; patient presents for follow-up. We have been using Apligraf to the left foot wound along with [name] total contact cast. She has done well with this. T the right o heel wound she has been using Aquacel Ag and Keystone antibiotic ointment. She has no issues or complaints today. She denies signs of infection. 1/31; patient presents for follow-up. We have been using Apligraf to the left foot wound along with the total contact cast. She continues to do well with this. To the right heel we have been using Aquacel Ag and Keystone antibiotic ointment. She has no issues or complaints today. 12-01-2022 upon  evaluation patient is seen today on my schedule due to the fact that she unfortunately was in the hospital yesterday. Her cast needed to come off the only reason she is out of the hospital is due to the fact that they would not take it off at the hospital which is somewhat bewildered reading to me to be perfectly honest. I am not certain why this was but either way she was released and then was placed on my schedule today in order to get this off and reapply the total contact casting as appropriate. I do not have an Apligraf for her today it was applied last week and today's actually expired yesterday as there was some scheduling conflicts with her being in the hospital. Nonetheless we do not have that for reapplication today but the good news that she is not draining too much and the Apligraf can go for up to 2 weeks so I am going to go ahead and reapply the total contact casting but we are going to leave the Apligraf in place. 2/14; patient presents for follow-up. T the left leg she has had the total contact cast and Apligraf for the past week. She has had no issues with this. T the o o right heel she has been using antibiotic ointment and Aquacel Ag. 2/21; patient presents for follow-up. We have been using Apligraf and [name] total contact cast to the left lower extremity. She is tolerated this well. Unfortunately she is not approved for any more Apligraf per insurance. She has been using antibiotic ointment and Aquacel Ag to the right foot. She has no issues or complaints today. 2/28; patient presents for follow-up. We have been using Hydrofera Blue and antibiotic ointment under the total contact cast to  the left lower extremity. He has been using Aquacel Ag with antibiotic ointment to the right plantar foot. She has no issues or complaints today. 3/6; patient presents for follow-up. She did not obtain her gentamicin ointment. She has been using Aquacel Ag to the right plantar foot wound. We have  been using Hydrofera Blue with antibiotic ointment under the total contact cast to the left lower extremity. She has no issues or complaints today. 3/12; patient presents for follow-up. She has been using gentamicin ointment and Aquacel Ag to the right plantar foot wound. We have been using Hydrofera Blue with antibiotic ointment under the total contact cast on the left lower extremity foot wound. She has no issues or complaints today. 3/20; patient presents for follow-up. She has been using gentamicin ointment and Aquacel Ag to the right plantar foot wound. We have been using Hydrofera Blue with antibiotic ointment under the total contact cast to the left lower extremity wound. She has no issues or complaints today. 3/27; patient presents for follow-up. We have been using antibiotic ointment and Aquacel Ag to the right plantar foot wound. We have been using Hydrofera Blue with antibiotic ointment under the total contact cast to the left lower extremity. Both wounds are smaller. 01-24-2023 upon evaluation today patient actually appears to be doing better compared to last time I saw her. It has been several weeks since I last saw her but nonetheless I think the total contact casting is doing [name] good job. Fortunately I do not see any evidence of infection or worsening overall which is great news and in general I do believe that she is moving in the appropriate direction. It has been [name] very long road but nonetheless I feel like she is nearing the end at least in regard to the left foot and even the right foot looks much better to be perfectly honest. 01-31-2023 upon evaluation today patient appears to be doing well currently in regard to her wounds. She has been require some sharp debridement which I think is probably bone both sides to remove [name] lot of callus that has built up at this point. I discussed that with her today. Also think total contact cast is doing well for the left foot Emina continue that as  well. 4/17; patient presents for follow-up. We have been doing Hydrofera Blue with antibiotic ointment under the total contact cast to the left lower extremity and Aquacel Ag with antibiotic ointment to the right foot wound. She has been using her offloading heel shoe here. 4/24; patient presents for follow-up. We have been doing Hydrofera Blue with antibiotic ointment under the total contact cast to the left lower extremity and Aquacel Ag with antibiotic ointment to the right foot wound. She has been using her offloading heel shoe here. Wounds are smaller with slough accumulation. 5/1; patient presents for follow-up. We have been doing Hydrofera Blue with antibiotic ointment under the total contact cast to the left lower extremity and Aquacel Ag with antibiotic ointment to the right foot wound. Left foot wound is stable and right foot wound appears smaller. 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 On the left she is using Hydrofera Blue topical antibiotic under [name] total contact cast, on the right plantar heel gentamicin Aquacel Ag. According to our intake nurse both are making slow but steady improvements. 5/15; patient presents for follow-up. We have been doing Hydrofera Blue with antibiotic ointment under the total contact cast on the left and antibiotic ointment with Aquacel Ag  on the right. Unfortunately the left foot wound has declined in size and appearance. We also do not have the total contact cast available in office today. Patient denies signs of infection. 5/22; patient presents for follow-up. We have been doing Hydrofera Blue and antibiotic ointment to the left foot and Aquacel Ag with antibiotic ointment to the right foot. We took [name] break from the cast since we did not have it placed at last week. Wounds look stable if not slightly improved. 03-27-2023 patient presents today she has had this For Most 2 Weeks Because She Was in the Hospital and They Did Not T It off. Again That Is Really  Not ake Something That She Could Control but Nonetheless We Are Seeing Her at This Point Today for Reevaluation. We Do Want to Go and See about Reapply the Cast. Again We Had All Set Everything up to Go Ahead and Do This When We Went to Get the Cast Only to Realize That Not Had Come in and We Were out at the Moment. With That Being [place] Spaniel We Will Get Have T Get the Exam before We Can Reapply the T Contact Casting Going Forward. o otal 6/12; patient presents for follow-up. She has been using Hydrofera Blue to the left foot wound and Aquacel Ag to the right foot wound. No cast was available last week placed on the patient so she has been offloading with her surgical shoe. She has no issues or complaints today. 6/19; patient presents for follow-up. We have been using Hydrofera Blue to the left foot wound under the total contact cast and Aquacel Ag to the right wound. She has no issues or complaints today. The wounds are smaller. 6/26; patient presents for follow-up. We have been using Hydrofera Blue to the left foot wound under the total contact cast and Aquacel Ag to the right foot wound. Wounds are smaller. Patient has no issues or complaints today. 7/3; patient presents for follow-up. We have been using Hydrofera Blue to the left foot wound under the total contact cast and Aquacel Ag to the right foot wound. Wounds Appear smaller. No signs of infection. 7/10; patient presents for follow-up. We have been using Hydrofera Blue to the left foot wound under the total contact cast and Aquacel Ag to the right foot wound. Left foot wound is smaller. Right foot wound is stable. 7/17; patient presents for follow-up. We have been using Hydrofera Blue to the left foot under the total contact cast and Aquacel Ag to the right foot wound. She has no issues or complaints today. 7/24; patient presents for follow-up. We have been using Hydrofera Blue to the left foot under the total contact cast and Aquacel Ag to the  right foot wound. Unfortunately she was bit by her dog over the weekend and developed [name] wound to the right lower leg. She has been keeping the area covered. 7/31; patient presents for follow-up. We have been using Hydrofera Blue to the left foot under the total contact cast and Aquacel Ag to the right foot wound [place], Tawny [name] ([id number]) [phone].pdf Page 9 of 21 and mupirocin ointment to the right anterior leg wound. She started her oral antibiotics 2 days ago. 8/7; patient presents for follow-up. We have been using Hydrofera Blue and antibiotic ointment to the left foot under the total contact cast and Aquacel Ag to the right foot wound. Antibiotic ointment to the right anterior leg wound. She completed her oral antibiotics. She has no issues or complaints  today. Wounds continue to improve albeit slowly. 8/14; 8/14; patient has 3 wounds 1 on the left plantar heel 1 on the right plantar heel and [name] small wound on the right anterior lower leg. We have been using Hydrofera Blue in the left heel Aquacel on the right heel. The left heel has been in [name] total contact cast 8/21; patient presents for follow-up. We have been using Hydrofera Blue to the left foot wound under the total contact cast. T the right heel patient has been o using Aquacel Ag. T the anterior leg wound antibiotic ointment. All wounds have healed. o 8/28; patient presents for follow-up. She has been using her surgical shoe with offloading felt pads. Her wounds have remained closed. 9/11; patient presents for final follow-up to assure that her wounds have remained closed. Unfortunately the left foot wound has opened again. But she has been wearing her Surgical shoe for offloading with felt pad. 07-14-2023 upon evaluation today patient appears to be doing well currently in regard to her wound. She has been tolerating the dressing changes without complication with total contact casting. I am going to be helping  take care of her at this point as she is transferring from Dr. Mikey Bussing to me since Dr. Mikey Bussing is on maternity leave. 07-24-2023 upon evaluation today patient appears to be doing decently well currently in regard to her wound. She is actually showing signs of improvement in regard to the left foot and I feel like this is much better than last week no debridement even necessary today. We may have to perform some slight debridement come next week we will see how things go. Monitor for any signs of worsening overall and obviously if anything changes she should be letting us know. 08-01-2023 upon evaluation today patient appears to be doing excellent in regard to her feet. The right foot/heel area is completely healed she does have some callus buildup I am going to trim this down [name] little bit I do not want to build up to the point of cracking she is doing well with the urea cream. With regard to the left heel she is doing well with casting I am going to perform some debridement here to clear away before reapplying the cast and I think she is really doing quite well though. 08-07-2023 upon evaluation patient's wound actually showed signs of excellent improvement and actually very pleased with where we stand I do believe that she is making good headway towards complete closure which is great news. 08-14-2023 upon evaluation today patient appears to be doing well currently in regard to her wound. She actually is going require [name] little bit of debridement but fortunately nothing too significant today actually feel like she is doing much better. This is mainly to remove callus on the little slough and biofilm down to good subcutaneous tissue. In general I think that she is making excellent headway here. 08-21-2023 upon evaluation today patient appears to be doing well currently in regard to her wound which is actually showing signs of significant improvement. I am extremely pleased with where things stand I do  not see any signs of infection at this time which is great news and overall I do think that we are making headway towards complete closure which is also excellent news. 08-30-2023 upon evaluation today patient's wound is showing signs of continued improvement. Fortunately I do not see any evidence of worsening overall and I believe that the patient is making excellent headway towards closure. I am  extremely pleased with where we stand. 09-04-2023 upon evaluation today patient appears to be doing well currently in regard to her foot ulcer. She has been tolerating the dressing changes without complication. Fortunately I do not see any signs of active infection locally or systemically at this time which is great news. No fevers, chills, nausea, vomiting, or diarrhea. 09-11-2023 upon evaluation today patient appears to be doing well currently in regard to her wound on the left foot. I feel like she is actually showing signs of improvement I feel like the collagen is doing [name] good job. With that being said unfortunately she has an area where there is [name] spot on the right foot that has not really open but at least there looks like there may have been something that popped off of this area. 09-18-2023 upon evaluation today patient appears to be doing well currently in regard to her wound although to be honest she still continues to have issues here with the wound not healing as rapidly as I would expect. She does [name] lot of walking on the cast from what I am hearing today and to be honest as I mention to her previously this cast is not exactly appropriate for her based on the size of her leg is technically too small and I think this is causing issues with rubbing and therefore callus buildup that would not otherwise occur with [name] properly fitting cast. I discussed that with her today and I think that potentially having [name] offloading shoe would be better specifically [name] front offloading boot. 09-25-2023 upon  evaluation patient's wound is showing signs of improvement which is great news and in general I do feel like that she is really doing quite well. I do not see any signs of active infection at this time which is excellent and in general I think that we are moving in the right direction here. She did get the offloading boot off of [employer] that working to get [name] try it is [name] front off loader which I think should do quite well and we will get [name] see how that does over the next week. 10-02-2023 upon evaluation today patient's wound actually showing signs of good improvement. I actually feel like it is overall looking better there is still quite [name] bit of callus but I did go ahead and perform debridement today to clear this away. She in general seems to be doing quite well. Electronic Signature(s) Signed: 10/02/2023 10:11:26 AM By: Heather Derry PA-C Entered By: [name] on 10/02/2023 10:11:25 Physical Exam Details -------------------------------------------------------------------------------- Tobey Grim ([id number]) [id number] Page 10 of 21 Patient Name: Date of Service: LING, LAWRENZ 10/02/2023 9:00 [name]: [id number] Patient Account Number: [id number] Date of Birth/Sex: Treating [name]: Feb 12, 1976 (47 y.o. [name] Primary Care Provider: Lindwood [name] Other Clinician: Betha [name] Referring Provider: Treating Provider/Extender: [name] in Treatment: 63 Constitutional Well-nourished and well-hydrated in no acute distress. Respiratory normal breathing without difficulty. Psychiatric this patient is able to make decisions and demonstrates good insight into disease process. Alert and Oriented x 3. pleasant and cooperative. Notes Upon inspection patient's wound bed actually showed signs of good granulation and epithelization at this p. Fortunately I do not see any signs of infection at this time which is great  news and in general I think there were making good headway here towards closure. Electronic Signature(s) Signed: 10/02/2023 10:11:57 AM By: Heather Derry PA-C Entered By: [name] on 10/02/2023 10:11:57 --------------------------------------------------------------------------------  Physician Orders Details Patient Name: Date of Service: CHENITA, WOLIVER 10/02/2023 9:00 [name]: [id number] Patient Account Number: [id number] Date of Birth/Sex: Treating [name]: January 02, 1976 (47 y.o. 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 Bathing/ Shower/ Hygiene May shower; gently cleanse wound with antibacterial soap, rinse and pat dry prior to dressing wounds No tub bath. Off-Loading Open toe surgical shoe - right foot Other: - front offloading boot left foot [place], Leba [name] ([id number]) [phone].pdf Page 11 of 21 Wound Treatment Wound #12R - Foot Wound Laterality: Plantar, Left, Medial Cleanser: Vashe 5.8 (oz) Every Other Day/30 Days Discharge Instructions: Use vashe 5.8 (oz) as directed Prim Dressing: Promogran Matrix 4.34 (in) Every Other Day/30 Days ary Discharge Instructions: Moisten w/normal saline or sterile water and apply into wound bed Secondary  Dressing: Gauze Every Other Day/30 Days Discharge Instructions: As directed: dry, moistened with saline or moistened with Dakins Solution Secured With: Medipore T - 61M Medipore H Soft Cloth Surgical T ape ape, 2x2 (in/yd) Every Other Day/30 Days Secured With: Kerlix Roll Sterile or Non-Sterile 6-ply 4.5x4 (yd/yd) Every Other Day/30 Days Discharge Instructions: Apply Kerlix as directed Secured With: [employer], Latex-free, Size 5, Small-Head / Shoulder / Thigh Every Other Day/30 Days Electronic Signature(s) Signed: 10/02/2023 4:09:48 PM By: [name] Signed: 10/02/2023 4:48:25 PM By: [name] Signed: 10/04/2023 7:54:12 PM By: Heather Derry PA-C Entered By: [name] on 10/02/2023 09:50:34 -------------------------------------------------------------------------------- Problem List Details Patient Name: Date of Service: [name] 10/02/2023 9:00 [name]: [id number] Patient Account Number: [id number] Date of Birth/Sex: Treating [name]: 08/31/1976 (47 y.o. [name] Primary Care Provider: Lindwood [name] Other Clinician: Betha [name] Referring Provider: Treating Provider/Extender: [name] Weeks in Treatment: 24 Active Problems ICD-10 Encounter Code Description Active Date MDM Diagnosis E11.621 Type 2 diabetes mellitus with foot ulcer 03/16/2022 No Yes L97.528 Non-pressure chronic ulcer of other part of left foot with other specified 03/16/2022 No Yes severity E11.42 Type 2 diabetes mellitus with diabetic polyneuropathy 03/16/2022 No Yes Z79.4 Long term (current) use of insulin 02/22/2023 No Yes N18.6 End stage renal disease 07/14/2023 No Yes Z99.2 Dependence on renal dialysis 07/14/2023 No Yes Sarria, Vantasia [name] ([id number]) [phone].pdf Page 12 of 21 L84 Corns and callosities 08/01/2023 No Yes Inactive Problems Resolved Problems ICD-10 Code Description Active Date Resolved Date L97.512  Non-pressure chronic ulcer of other part of right foot with fat layer exposed 03/16/2022 03/16/2022 L97.811 Non-pressure chronic ulcer of other part of right lower leg limited to breakdown of skin 03/16/2022 03/16/2022 W54.0XXA Bitten by dog, initial encounter 05/17/2023 05/17/2023 S81.801A Unspecified open wound, right lower leg, initial encounter 05/17/2023 05/17/2023 J19.417 Type 2 diabetes mellitus with other skin ulcer 05/17/2023 05/17/2023 Electronic Signature(s) Signed: 10/02/2023 9:15:42 AM By: Heather Derry PA-C Entered By: [name] on 10/02/2023 09:15:42 -------------------------------------------------------------------------------- Progress Note Details Patient Name: Date of Service: [name], Chasty [name].  10/02/2023 9:00 [name]: [id number] Patient Account Number: [id number] Date of Birth/Sex: Treating [name]: May 04, 1976 (47 y.o. [name] Primary Care Provider: Lindwood [name] Other Clinician: Betha [name] Referring Provider: Treating Provider/Extender: [name] in Treatment: 15 Subjective Chief Complaint Information obtained from Patient Left foot ulcer History of Present Illness (HPI) 01/18/18-She is here for initial evaluation of the left great toe ulcer. She is [name] poor historian in regards to timeframe in detail. She states approximately 4 weeks ago she lacerated her toe on something in the house. She followed up with her primary care who placed her on Bactrim and ultimately [name] second dose of Bactrim prior to coming to wound clinic. She states she has been treating the toe with peroxide, Betadine and [name] Band-Aid. She did not check her blood sugar this morning but checked it yesterday morning it was 327; she is unaware of [name] recent A1c and there are no current records. She saw Dr. she would've orthopedics last week for an old injury to the left ankle, she states he did not see her toe, nor did she bring it to his attention. She smokes  approximately 1 pack cigarettes [name] day. Her social situation is concerning, she arrives this morning with her mother who appears extremely intoxicated/under the influence; her mother was asked to leave the room and be monitored by the patient's grandmother. The patient's aunt then accompanied the patient and the room throughout the rest of the appointment. We had [name] lengthy discussion regarding the deleterious effects of uncontrolled hyperglycemia and smoking as it relates to wound healing and overall health. She was strongly encouraged to decrease her smoking and get her diabetes under better control. She states she is currently on [name] diet and has cut down her [facility] consumption. The left toe is erythematous, macerated and slightly edematous with malodor present. The edema in her left foot is below her baseline, there is no erythema streaking. We will treat her with Santyl, doxycycline; we have ordered and xray, culture and provided [name] Peg assist surgical shoe and cultured the wound. [name], [name] ([id number]) [id number] Page 35 of 21 01/25/18-She is here in follow-up evaluation for [name] left great toe ulcer and presents with an abscess to her suprapubic area. She states her blood sugars remain elevated, feeling "sick" and if levels are below 250, but she is trying. She has made no attempt to decrease her smoking stating that we "can't take away her food in her cigarettes". She has been compliant with offloading using the PEG assist you. She is using Santyl daily. the culture obtained last week grew staph aureus and Enterococcus faecalis; continues on the doxycycline and Augmentin was added on Monday. The suprapubic area has erythema, no femoral variation, purple discoloration, minimal induration, was accessed with [name] cotton tip applicator with sanguinopurulent drainage, this was cultured, I suspect the current antibiotic treatment will cover and we will not add anything to  her current treatment plan. She was advised to go to urgent care or ER with any change in redness, induration or fever. 02/01/18-She is here in follow-up evaluation for left great toe ulcers and [name] new abdominal abscess from last week. She was able to use packing until earlier this week, where she "forgot it was there". She states she was feeling ill with GI symptoms last week and was not taking her antibiotic. She states her glucose levels have been predominantly less than 200, with occasional levels between 200-250. She thinks  this was contributing to her GI symptoms as they have resolved without intervention. There continues to be significant laceration to left toe, otherwise it clinically looks stable/improved. There is now less superficial opening to the lateral aspect of the great toe that was residual blister. We will transition to [facility] to all wounds, she will continue her Augmentin. If there is no change or deterioration next week for reculture. 02/08/18-She is here in follow-up evaluation for left great toe ulcer and abdominal ulcer. There is an improvement in both wounds. She has been wrapping her left toe with coban, not by our direction, which has created an area of discoloration to the medial aspect; she has been advised to NOT use coban secondary to her neuropathy. She states her glucose levels have been high over this last week ranging from 200-350, she continues to smoke. She admits to being less compliant with her offloading shoe. We will continue with same treatment plan and she will follow-up next week. 02/15/18-She is here in follow-up evaluation for left great toe ulcer and abdominal ulcer. The abdominal ulcer is epithelialized. The left great toe ulcer is improved and all injury from last week using the Coban wrap is resolved, the lateral ulcer is healed. She admits to noncompliance with wearing offloading shoe and admits to glucose levels being greater than 300 most of the  week. She continues to smoke and expresses no desire to quit. There is one area medially that probes deeper than it has historically, erythema to the toe and dorsal foot has consistently waxed and waned. There is no overt signs of cellulitis or infection but we will culture the wound for any occult infection given the new area of depth and erythema. We will hold off on sensitivities for initiation of antibiotic therapy. 02/22/18-She is here in follow up evaluation for left great toe ulcer. There is overall significant improvement in both wound appearance, erythema and edema with changes made last week. She was not initiated on antibiotic therapy. Culture obtained last week showed oxacillin sensitive staph aureus, sensitive to clindamycin. Clindamycin has been called into the pharmacy but she has been instructed to hold off on initiation secondary to overall clinical improvement and her history of antibiotic intolerance. She has been instructed to contact the clinic with any noted changes/deterioration and the wound, erythema, edema and/or pain. She will follow-up next week. She continues to smoke and her glucose levels remain elevated >250; she admits to compliance with offloading shoe 03/01/18 on evaluation today patient appears to be doing fairly well in regard to her left first toe ulcer. She has been tolerating the dressing changes with the [facility] Dressing without complication and overall this has definitely showed signs of improvement according to records as well is what the patient tells me today. I'm very pleased in that regard. She is having no pain today 03/08/18 She is here for follow up evaluation of [name] left great toe ulcer. She remains non-compliant with glucose control and smoking cessation; glucose levels consistently >200. She states that she got new shoe inserts/peg assist. She admits to compliance with offloading. Since my last evaluation there is significant improvement. We will  switch to prisma at this time and she will follow up next week. She is noted to be tachycardic at this appointment, heart rate 120s; she has [name] history of heart rate 70-130 according to our records. She admits to extreme agitation r/t personal issues; she was advised to monitor her heartrate and contact her physician if  it does not return to [name] more normal range (<100). She takes cardizem twice daily. 03/15/18-She is here in follow-up evaluation for left great toe ulcer. She remains noncompliant with glucose control and smoking cessation. She admits to compliance with wearing offloading shoe. The ulcer is improved/stable and we will continue with the same treatment plan and she will follow-up next week 03/22/18-She is here for evaluation for left great toe ulcer. There continues to be significant improvement despite recurrent hyperglycemia (over 500 yesterday) and she continues to smoke. She has been compliant with offloading and we will continue with same treatment plan and she will follow-up next week. 03/29/18-She is here for evaluation for left great toe ulcer. Despite continuing to smoke and uncontrolled diabetes she continues to improve. She is compliant with offloading shoe. We will continue with the same treatment plan and she will follow-up next week 04/05/18- She is here in follow up evaluation for [name] left great toe ulcer; she presents with small pustule to left fifth toe (resembles ant bite). She admits to compliance with wearing offloading shoe; continues to smoke or have uncontrolled blood glucose control. There is more callus than usual with evidence of bleeding; she denies known trauma. 04/12/18-She is here for evaluation of left great toe ulcer. Despite noncompliance with glycemic control and smoking she continues to make improvement. She continues to wear offloading shoe. The pustule, that was identified last week, to the left fifth toe is resolved. She will follow-up in 2 weeks 05/03/18-she is  seen in follow-up evaluation for [name] left great toe ulcer. She is compliant with offloading, otherwise noncompliant with glycemic control and smoking. She has plateaued and there is minimal improvement noted. We will transition to [facility], replaced the insert to her surgical shoe and she will follow-up in one week 05/10/18- She is here in follow up evaluation for [name] left great toe ulcer. It appears stable despite measurement change. We will continue with same treatment plan and follow up next week. 05/24/18-She is seen in follow-up evaluation for [name] left great toe ulcer. She remains compliant with offloading, has made significant improvement in her diet, decreasing the amount of sugar/soda. She said her recent A1c was 10.9 which is lower than. She did see [name] diabetic nutritionist/educator yesterday. She continues to smoke. We will continue with the same treatment plan and she'll follow-up next week. 05/31/18- She is seen in follow-up evaluation for left great toe ulcer. She continues to remain compliant with offloading, continues to make improvement in her diet, increasing her water and decreasing the amount of sugar/soda. She does continue to smoke with no desire to quit. We will apply Prisma to the depth and Hydrofera Blue over. We have not received insurance authorization for oasis. She will follow up next week. 06/07/18-She is seen in follow-up evaluation for left great toe ulcer. It has stalled according to today's measurements although base appears stable. She says she saw [name] diabetic educator yesterday; her average blood sugars are less than 300 which is an improvement for her. She continues to smoke and states "that's my next step" She continues with water over soda. We will order for xray, culture and reinstate ace wrap compression prior to placing apligraf for next week. She is voicing no complaints or concerns. Her dressing will change to iodoflex over the next week in preparation for  apligraf. 06/14/18-She is seen in follow-up evaluation for left great toe ulcer. Plain film x-ray performed last week was negative for osteomyelitis. Wound culture obtained last  week grew strep B and OSSA; she is initiated on keflex and cefdinir today; there is erythema to the toe which could be from ace wrap compression, she has [name] history of wrapping too tight and has has been encouraged to maintain ace wraps that we place today. We will hold off on application of apligraf today, will apply next week after antibiotic therapy has been initiated. She admits today that she has resumed taking [name] shower with her foot/toe submerged in water, she has been reminded to keep foot/toe out of the bath water. She will be seen in follow up next week 06/21/18-she is seen in follow-up evaluation for left great toe ulcer. She is tolerating antibiotic therapy with no GI disturbance. The wound is stable. Apligraf was applied today. She has been decreasing her smoking, only had 4 cigarettes yesterday and 1 today. She continues being more compliant in diabetic diet. She will follow-up next week for evaluation of site, if stable will remove at 2 weeks. 06/28/18- She is here in follow up evalution. Apligraf was placed last week, she states the dressing fell off on Tuesday and she was dressing with hydrofera blue. She is healed and will be discharged from the clinic today. She has been instructed to continue with smoking cessation, continue monitoring glucose levels, offloading for an additional 4 weeks and continue with hydrofera blue for additional two weeks for any possible microscopic opening. Readmission: 08/07/18 on evaluation today patient presents for reevaluation concerning the ulcer of her right great toe. She was previously discharged on 06/28/18 healed. Nonetheless she states that this began to show signs of drainage she subsequently went to her primary care provider. Subsequently an x-ray was performed on 08/01/18  which was negative. The patient was also placed on antibiotics at that time. Fortunately they should have been effective for the infection. Nonetheless she's been experiencing some improvement but still has [name] lot of drainage coming from the wound itself. 08/14/18 on evaluation today patient's wound actually does show signs of improvement in regard to the erythema at this point. She has completed the antibiotics. With that being said we did discuss the possibility of placing her in [name] total contact cast as of today although I think that I may want to give this just [name] little bit more time to ensure nothing recurrence as far as her infection is concerned. I do not want to put in the cast and risk infection at that time if things are not completely resolved. With that being said she is gonna require some debridement today. 08/21/18 on evaluation today patient actually appears to be doing okay in regard to her toe ulcer. She's been tolerating the dressing changes without complication. With that being said it does appear that she is ready and in fact I think it's appropriate for [place] to go ahead and initiate the total contact cast today. Nonetheless she will require some sharp debridement to prepare the wound for application. Overall I feel like things have been progressing well but we do need to do something to get this to close more readily. [name], [name] ([id number]) [id number] Page 14 of 21 08/24/18 patient seen today for reevaluation after having had the total contact cast applied on Tuesday. She seems to have done very well the wound appears to be doing great and overall I'm pleased with the progress that she's made. There were no abnormal areas of rubbing from the cast on her lower extremity. 08/30/18 on evaluation today patient actually appears to be  completely healed in regard to her plantar toe ulcer. She tells me at this point she's been having [name] lot of issues with the  cast. She almost fell [name] couple of times the state shall the step of her dog [name] couple times as well. This is been [name] very frustrating process for her other nonetheless she has completely healed the wound which is excellent news. Overall there does not appear to be the evidence of infection at this time which is great news. 09/11/18 evaluation today patient presents for follow-up concerning her great toe ulcer on the left which has unfortunately reopened since I last saw her which was only [name] couple of weeks ago. Unfortunately she was not able to get in to get the shoe and potentially the AFO that's gonna be necessary due to her left foot drop. She continues with offloading shoe but this is not enough to prevent her from reopening it appears. When we last had her in the total contact cast she did well from [name] healing standpoint but unfortunately the wound reopened as soon as she came out of the cast within just [name] couple of weeks. 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 This is also part of the reason why she falls on [name] regular basis. I just do not believe that is gonna be safe for [place] to reinitiate the total contact cast as last time we had this on she fell 3 times one week which is definitely not normal for her. 09/18/18 upon evaluation today the patient actually appears to be doing about the same in regard to her toe ulcer. She did not contact [employer] as I asked her to even though I had given her the prescription. In fact she actually states that she has no idea where the prescription is. She did apparently call [employer] and they told her that all she needed to do was bring the prescription in order to be able to be seen and work on getting the AFO for her left foot. With all that being said she still does not  have an appointment and I'm not sure were things stand that regard. I will give her [name] new prescription today in order to contact them to get this set up. 09/25/18 on evaluation today patient actually appears to be doing about the same in regard to her toes ulcer. She does have [name] small areas which seems to have [name] lot of callous buildup around the edge of the wound which is going to need sharp debridement today. She still is waiting to be scheduled for evaluation with [facility] for possibility of an AFO. She states there supposed to call her tomorrow to get this set up. Unfortunately it does appear that her foot specifically the toe area is showing signs of erythema. There does not appear to be any systemic infection which is in these good news. 10/02/18 on evaluation today patient actually appears to be doing about the same in regard to her toe ulcer. This really has not done too well although it's not significantly larger it's also not significantly smaller. She has been tolerating the dressing changes without complication. She actually has her appointment with [facility] and [place] tomorrow to hopefully be measured for obtaining and AFO splint. I think this would  be helpful preventing this from reoccurring. We had contemplated starting the cast this week although to be honest I am reluctant to do that as she's been having nausea, vomiting, and seizure activity over the past three days. She has [name] history of seizures and have been told is nothing that can be done for these. With that being said I do believe that along with the seizures have the nausea vomiting which upon further questioning doesn't seem to be the normal for her and makes me concerned for the possibility of infection or something else going on. I discussed this with the patient and her mother during the office visit today. I do not feel the wound is effective but maybe something else. The responses this was "this just happens to her at times  and we don't know why". They did not seem to be interested in going to the hospital to have this checked out further. 10/09/18 on evaluation today patient presents for follow-up concerning her ongoing toe ulcer. She has been tolerating the dressing changes without complication. Fortunately there does not appear to be any evidence of infection which is great news however I do think that the patient would benefit from going ahead for with the total contact cast. She's actually in [name] wheelchair today she tells me that she will use her walker if we initiate the cast. I was very specific about the fact that if we were gonna do the cast I wanted to make sure that she was using the walker in order to prevent any falls. She tells me she does not have stairs that she has to traverse on [name] regular basis at her home. She has not had any seizures since last week again that something that happens to her often she tells me she did talk to [employer] and they said that it may take up to three weeks to get the brace approved for her. Hopefully that will not take that long but nonetheless in the meantime I do think the cast could be of benefit. 10/12/18 on evaluation today patient appears to be doing rather well in regard to her toe ulcer. It's just been [name] few days and already this is significantly improved both as far as overall appearance and size. Fortunately there's no sign of infection. She is here for her first obligatory cast change. 10/19/18 Seen today for follow up and management of left great toe ulcer. Wound continues to show improvement. Noted small open area with seroussang drainage with palpation. Denies any increased pain or recent fevers during visit. She will continue calcium alginate with offloading shoe. Denies any questions or concerns during visit. 10/26/18 on evaluation today patient appears to be doing about the same as when I last saw her in regard to her wound bed. Fortunately there does not appear  to be any signs of infection. Unfortunately she continues to have [name] breakdown in regard to the toe region any time that she is not in the cast. It takes almost no time at all for this to happen. Nonetheless she still has not heard anything from the brace being made by [facility] as to when exactly this will be available to her. Fortunately there is no signs of infection at this time. 10/30/18 on evaluation today patient presents for application of the total contact cast as we just received him this morning. Fortunately we are gonna be able to apply this to her today which is great news. She continues to have no significant pain which  is good news. Overall I do feel like things have been improving while she was the cast is when she doesn't have [name] cast that things get worse. She still has not really heard anything from [facility] regarding her brace. 11/02/18 upon evaluation today patient's wound already appears to be doing significantly better which is good news. Fortunately there does not appear to be any signs of infection also good news. Overall I do think the total contact cast as before is helping to heal this area unfortunately it's just not gonna likely keep the area closed and healed without her getting her brace at least. Again the foot drop is [name] significant issue for her. 11/09/18 on evaluation today patient appears to be doing excellent in regard to her toe ulcer which in fact is completely healed. Fortunately we finally got the situation squared away with the paperwork which was needed to proceed with getting her brace approved by Medicaid. I have filled that out unfortunately that information has been sent to the orthopedic office that I worked at 2 1/2 years ago and not tired Current wound care measures. Fortunately she seems to be doing very well at this time. 11/23/18 on evaluation today patient appears to be doing More Poorly Compared to Last Time I Saw Her. At [facility] She Had Completely  Healed. Currently she is continuing to have issues with reopening. She states that she just found out that the brace was approved through Medicaid now she just has to go get measured in order to have this fitted for her and then made. Subsequently she does not have an appointment for this yet that is going to complicate things we obviously cannot put her back in the cast if we do not have everything measured because they're not gonna be able to measure her foot while she is in the cast. 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 Obviously this is unfortunate and does have me somewhat worried as well. 11/30/18 on evaluation today patient's toe ulcer actually appears to be doing fairly well. The good news is she will be getting her brace in the shoes next week on Wednesday. Hopefully we will be able to get this to heal without having to go back in the cast however she may need the cast in order to get the wound completely heal and then go from there. Fortunately there's no signs of infection at this time. 12/07/18 on evaluation today patient fortunately did receive her brace and she states she could tell this definitely makes her walk better. With that being said she's been having issues with her toe where she noticed yesterday there was [name] lot of tissue that was loosing off this appears to be much larger than what it was previous. She also states that her leg has been read putting much across the top of her foot just about the ankle although this seems to be receiving somewhat. The total area is still red and appears to be someone infected as best I can tell. She is previously taken Bactrim and that may be [name] good option for her today as well. We are gonna see what I wound culture shows as well and I think that this is definitely appropriate. With that being said outside of the culture I still need to initiate  something in the interim and that's what I'm gonna go ahead and select  Bactrim is [name] good option for her. 12/14/18 on evaluation today patient appears to be doing better in regard to her left great toe ulcer as compared to last week's evaluation. There's still some erythema although this is significantly improved which is excellent news. Overall I do believe that she is making good progress is still gonna take some time before she is where I would like her to be from the standpoint of being able to place her back into the total contact cast. Hopefully we will be where we need to [name], [name] ([id number]) [id number] Page 15 of 21 be by next week. 12/21/18 on evaluation today patient actually appears to be doing poorly in regard to her toe ulcer. She's been tolerating the dressing changes without complication. Fortunately there's no signs of systemic infection although she does have [name] lot of drainage from the toe ulcer and this does seem to be causing some issues at this point. She does have erythema on the distal portion of her toe that appears to be likely cellulitis. 12/28/18 on evaluation today patient actually appears to be doing [name] little better in my pinion in regard to her toe ulcer. With that being said she still does have some evidence of infection at this time and for her culture she had both E. coli as well as enterococcus as organisms noted on evaluation. For that reason I think that though the Keflex likely has treated the E. coli rather well this has really done nothing for the enterococcus. We are going to have to initiate treatment for this specifically. 01/04/19 on evaluation today patient's toe actually appears to be doing better from the standpoint of infection. She currently would like to see about putting the cash back on I think that this is appropriate as long as she takes care of it and keeps it from getting wet. She is gonna have some drainage we can  definitely pass this up with Drawtex and alginate to try to prevent as much drainage as possible from causing the problems. With that being said I do want to at least try her with the cast between now and Tuesday. If there any issues we can't continue to use it then I will discontinue the use of the cast at that point. 01/08/19 on evaluation today patient actually appears to be doing very well as far as her foot ulcer specifically the great toe on the left is concerned. She did have an area of rubbing on the medial aspect of her left ankle which again is from the cast. Fortunately there's no signs of infection at this point in this appears to be [name] very slight skin breakdown. The patient tells me she felt it rubbing but didn't think it was that bad. Fortunately there is no signs of active infection at this time which is good news. No fevers, chills, nausea, or vomiting noted at this time. 01/15/19 on evaluation today patient actually appears to be doing well in regard to her toe ulcer. Again as previous she seems to do well and she has the cast on which indicates to me that during the time she doesn't have [name] cast on she's putting way too much pressure on this region. Obviously I think that's gonna be an issue as with the current national emergency concerning the Covid-19 Virus it has been recommended that we discontinue the use of total contact casting by the chief medical officer of our company, Dr. Maurine Minister. The reasoning is that if [name] patient becomes  sick and cannot come into have the cast removed they could not just leave this on for an additional two weeks. Obviously the hospitals also do not want to receive patient's who are sick into the emergency department to potentially contaminate the region and spread the Covid-19 Virus among other sick individuals within the hospital system. Therefore at this point we are suspending the use of total contact cast until the current emergency subsides. This was all  discussed with the patient today as well. 01/22/19 on evaluation today patient's wound on her left great toe appears to be doing slightly worse than previously noted last week. She tells me that she has been on this quite [name] bit in fact she tells me she's been awake for 38 straight hours. This is due to the fact that she's having to care for grandparents because nobody else will. She has been taking care of them for five the last seven days since I've seen her they both have dementia his is from [name] stroke and her grandmother's was progressive. Nonetheless she states even her mom who knows her condition and situation has only help two of those days to take care of them she's been taking care of the rest. Fortunately there does not appear to be any signs of active infection in regard to her toe at this point although obviously it doesn't look as good as it did previous. I think this is directly related to her not taking off the pressure and friction by way of taking things easy. Though I completely understand what's going on. 01/29/19 on evaluation today patient's tools are actually appears to be showing some signs of improvement today compared to last week's evaluation as far as not necessarily the overall size of the wound but the fact that she has some new skin growth in between the two ends of the wound opening. Overall I feel like she has done well she states that she had [name] family member give her what sounds to be [name] CAM walker boot which has been helpful as well. 02/05/19 on evaluation today patient's wound bed actually appears to be doing significantly better in regard to her overall appearance of the size of the wound. With that being said she is still having an issue with offloading efficiently enough to get this to close. Apparently there is some signs of infection at this point as well unfortunately. Previously she's done well of Augmentin I really do not see anything that needs to be culture  currently but there theme and cellulitis of the foot that I'm seeing I'm gonna go ahead and place her on an antibiotic today to try to help clear this up. 02/12/2019 on evaluation today patient actually appears to be doing poorly in regard to her overall wound status. She tells me she has been using her offloading shoe but actually comes in today wearing her tennis shoe with the AFO brace. Again as I previously discussed with her this is really not sufficient to allow the area to heal appropriately. Nonetheless she continues to be somewhat noncompliant and I do wonder based on what she has told my nurse in the past as to whether or not [name] good portion of this noncompliance may be recreational drug and alcohol related. She has had [name] history of heroin overdose and this was fairly recently in the past couple of months that have been seeing her. Nonetheless overall I feel like her wound looks significantly worse today compared to what it was previous.  She still has significant erythema despite the Augmentin I am not sure that this is an appropriate medication for her infection I am also concerned that the infection may have gone down into her bone. 02/19/19 on evaluation today patient actually appears to be doing about the same in regard to her toe ulcer. Unfortunately she continues to show signs of bone exposure and infection at this point. There does not appear to be any evidence of worsening of the infection but I'm also not really sure that it's getting significantly better. She is on the Augmentin which should be sufficient for the Staphylococcus aureus infection that she has at this point. With that being said she may need IV antibiotics to more appropriately treat this. We did have [name] discussion today about hyperbaric option therapy. 02/28/19 on evaluation today patient actually appears to be doing much worse in regard to the wound on her left great toe as compared to even my previous evaluation last  week. Unfortunately this seems to be training in [name] pretty poor direction. Her toe was actually now starting to angle laterally and I can actually see the entire joint area of the proximal portion of the digit where is the distal portion of the digit again is no longer even in contact with the joint line. Unfortunately there's [name] lot more necrotic tissue around the edge and the toe appears to be showing signs of becoming gangrenous in my pinion. I'm very concerned about were things stand at this point. She did see infectious disease and they are planning to send in [name] prescription for Sivextro for her and apparently this has been approved. With that being said I don't think she should avoid taking this but at the same time I'm not sure that it's gonna be sufficient to save her toe at this point. She tells me that she still having to care for grandparents which I think is putting quite [name] bit of strain on her foot and specifically the total area and has caused this to break down even to [name] greater degree than would've otherwise been expected. 03/05/19 on evaluation today patient actually appears to be doing quite well in regard to her toe all things considering. She still has bone exposed but there appears to be much less your thing on overall the appearance of the wound and the toe itself is dramatically improved. She still does have some issues currently obviously with infection she did see vascular as well and there concerned that her blood flow to the toad. For that reason they are setting up for an angiogram next week. 03/14/19 on evaluation today patient appears to be doing very poor in regard to her toe and specifically in regard to the ulceration and the fact that she's starting to notice the toe was leaning even more towards the lateral aspect and the complete joint is visible on the proximal aspect of the joint. Nonetheless she's also noted [name] significant odor and the tip of the toe is turning more  dark and necrotic appearing. Overall I think she is getting worse not better as far as this is concerned. For that reason I am recommending at this point that she likely needs to be seen for likely amputation. READMISSION 03/19/2021 This is [name] patient that we cared for in this clinic for [name] prolonged period of time in 2019 and 2020 with [name] left foot and left first toe wound. 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 Excell Seltzer. In December 2021 she had an ulcer on her right great toe as well as the fourth and fifth toes. 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 She also had an angiogram at that time and underwent angioplasty of the right anterior tibial artery. In any case she claims that the wound on the right foot is closed I did not look at this today which was probably an oversight although I think that should be done next week. 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 According to Dr. Bernette Redbird last review that she was out of the area being cared for by another physician but recently came back to his attention. 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 [name] culture of this area showed E. coli apparently before she came back to see Dr. Excell Seltzer she was supposed to be receiving antibiotics but she did not really take them. Nor is she offloading this area at all. Finally her last hemoglobin A1c listed in epic was in March 2022 at 14.1 she says things are [name] lot better since then although I am not sure. 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 She had this wound at the [facility] [name] ([id number]) [id number] Page 16 of 21 time. During this admission she had E. coli sepsis [name] TEE was  negative. 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 She is still [name] heavy cigarette smoker. She has [name] history of bipolar disorder seizure disorder ABI in our clinic was 1.05 6/1; left midfoot in the setting of [name] TMA done previously. Round circular wound with [name] "knuckle" of protruding tissue. The problem is that the knuckle was not attached to any of the surrounding granulation and this probed proximally widely I removed [name] large portion of this tissue. This wound goes with considerable undermining laterally. I do not feel any bone there was no purulence but this is [name] deep wound. 6/8; in spite of the debridement I did last week. She arrives with [name] wound looking exactly the same. [name] protruding "knuckle" of tissue nonadherent to most of the surrounding tissue. There is considerable depth around this from 6-12 o'clock at 2.7 cm and undermining of 1 cm. This does not look overtly infected and the x- ray I did last week was negative for any osseous abnormalities. We have been using silver collagen 6/15; deep tissue culture I did last week showed moderate staph aureus and moderate Pseudomonas. This will definitely require prolonged antibiotic therapy. The pathology on the protuberant area was negative for malignancy fungus etc. 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 We have been using silver collagen. I am going to be prescribing Levaquin for 2 weeks. Her CT scan of the foot is down for 7/5 6/22; CT scan of the foot on 7 5. She says she has hardware in the left leg from her previous fracture. She is on the Levaquin for the deep tissue culture I did that showed methicillin sensitive staph aureus  and Pseudomonas. I gave her [name] 2-week supply and  she will have another week. She arrives in clinic today with the same protuberant tissue however this is nonadherent to the tissue surrounding it. I am really at [name] loss to explain this unless there is underlying deep tissue infection 6/29; patient presents for 1 week follow-up. She has been using collagen to the wound bed. She reports taking her antibiotics as prescribed.She has no complaints or issues today. She denies signs of infection. 7/6; patient presents for one week followup. She has been using collagen to the wound bed. She states she is taking Levaquin however at times she is not able to keep it down. She denies signs of infection. 7/13; patient presents for 1 week follow-up. She has been using silver alginate to the wound bed. She still has nausea when taking her antibiotics. She denies signs of infection. 7/20; patient presents for 1 week follow-up. She has been using silver alginate with gentamicin cream to the wound bed. She denies any issues and has no complaints today. She denies signs of infection. 7/27; patient presents for 1 week follow-up. She continues to use silver alginate with gentamicin cream to the wound bed. She reports starting her antibiotics. She has no issues or complaints. Overall she reports stability to the wound. 8/3; patient presents for 1 week follow-up. She has been using silver alginate with gentamicin cream to the wound bed. She reports completing all antibiotics. She has no issues or complaints today. She denies signs of infection. 8/17; patient presents for 2-week follow-up. He is to use silver alginate to the wound bed. She has no issues or complaints today. She denies signs of infection. She reports her pain has improved in her foot since last clinic visit 8/24; patient presents for 1 week follow-up. She continues to use silver alginate to the wound bed. She has no issues or complaints. She denies signs  of infection. Pain is stable. 9/7; patient presents for follow-up. She missed her last week appointment due to feeling ill. She continues to use silver alginate. She has [name] new wound to the right lower extremity that is covered in eschar. She states It occurred over the past week and has no idea how it started. She currently denies signs of infection. 9/14; patient presents for follow-up. T the left foot wound she has been using gentamicin cream and silver alginate. T the right lower extremity wound she has o o been keeping this covered and has not obtain Santyl. 9/21; patient presents for follow-up. She reports using gentamicin cream and silver alginate to the left foot and Santyl to the right lower extremity wound. She has no issues or complaints today. She denies signs of infection. 9/28; patient presents for follow-up. She reports [name] new wound to her right heel. She states this occurred [name] few days ago and is progressively gotten worse. She has been trying to clean the area with [name] Q-tip and Santyl. She reports stability in the other 2 wounds. She has been using gentamicin cream and silver alginate to the left foot and Santyl to the right lower extremity wound. 10/12; patient presents for follow-up. She reports improvement to the wound beds. She is seeing vein and vascular to discuss the potential of [name] left BKA. She states they are going to do an arteriogram. She continues to use silver alginate with dressing changes to her wounds. 11/2; patient presents for follow-up. She states she has not been doing dressing changes to the wound beds. She states she is not able to offload the areas.  She reports chronic pain to her left foot wound. 11/9; patient presents for follow-up. She came in with only socks on. She states she forgot to put on shoes. It is unclear if she is doing any dressing changes. She currently denies systemic signs of infection. 11/16; patient presents for follow-up. She came again  only with socks on. She states she does not wear shoes ever. It is unclear if she does dressing changes. She currently denies systemic signs of infection. 11/23; patient presents for follow-up. She wore her shoes today. It still unclear exactly what dressing she is using for each wound but she did states she obtained Dakin's solution and has been using this to the left foot wound. She currently denies signs of infection. 11/30; patient presents for follow-up. She has no issues or complaints today. She currently denies signs of infection. 12/7; patient presents for follow-up. She has no issues or complaints today. She has been using Hydrofera Blue to the right heel wound and Dakin solution to the left foot wound. Her right anterior leg wound is healed. She currently denies signs of infection. 12/14; patient presents for follow-up. She has been using Hydrofera Blue to the right heel and Dakin's to the left foot wounds. She has no issues or complaints today. She denies signs of infection. 12/21; patient presents for follow-up. She reports using Hydrofera Blue to the right heel and Dakin's to the left foot wound. She denies signs of infection. 12/28; patient presents for follow-up. She continues to use Dakin's to the left foot wound and Hydrofera Blue to the right heel wound. She denies signs of infection. 1/4; patient presents for follow-up. She has no issues or complaints today. She denies signs of infection. 1/11; patient presents for follow-up. It is unclear if she has been dressing these wounds over the past week. She currently denies signs of infection. 1/18; patient presents for follow-up. She states she has been using Dakin's wet-to-dry dressings to the left foot. She has been using Hydrofera Blue to the right foot foot wound. She states that the anterior right leg wound has reopened and draining serous fluid. She denies signs of infection. 1/25; patient presents for follow-up. She has no issues  or complaints today. 2/1; patient presents for follow-up. She has no issues or complaints today. She denies signs of infection. 2/8; patient presents for follow-up. She has lost her surgical shoes. She did not have [name] dressing to the right heel wound. She currently denies signs of infection. 2/15; patient presents for follow-up. She reports more pain to the right heel today. She denies purulent drainage Or fever/chills [name], [name] [name] ([id number]) [phone].pdf Page 17 of 21 2/22; patient presents for follow-up. She reports taking clindamycin over the past week. She states that she continues to have pain to her right heel. She reports purulent drainage. Readmission 03/16/2022 Ms. Susej Specker is [name] 47 year old female with [name] past medical history of type 2 diabetes, osteomyelitis to her feet, chronic systolic heart failure and bipolar disorder that presents to the clinic for bilateral feet wounds and right lower extremity wound. She was last seen in our clinic on 12/15/2021. At that time she had purulent drainage coming out of her right plantar foot and I recommended she go to the ED. She states she went to [facility] and has been there for the past 3 months. I cannot see the records. She states she had OR debridement and was on several weeks of IV antibiotics while inpatient. Since discharge she  has not been taking care of the wound beds. She had nothing on her feet other than socks today. She currently denies signs of infection. 5/31; patient presents for follow-up. She has been using Dakin's wet-to-dry dressings to the wound beds on her feet bilaterally and antibiotic ointment to the right anterior leg wound. She had [name] wound culture done at last clinic visit that showed moderate Pseudomonas aeruginosa sensitive to ciprofloxacin. She currently denies systemic signs of infection. 6/14; patient presents for follow-up. She received Keystone 5 days ago and has been  using this on the wound beds. She states that last week she had to go to the hospital because she had increased warmth and erythema to the right foot. She was started on 2 oral antibiotics. She states she has been taking these. She currently denies systemic signs of infection. She has no issues or complaints today. 6/21; patient presents for follow-up. She states she has been using Keystone antibiotics to the wound beds. She has no issues or complaints today. She denies signs of infection. 6/28; patient presents for follow-up. She has been using Keystone antibiotics to the wound beds. She has no issues or complaints today. 7/12; patient presents for follow-up. Has been using Keystone antibiotics to the wound beds with calcium alginate. She has no issues or complaints today. She never followed up with her orthopedic surgeon who did the OR debridement to the right foot. We discussed the total contact cast for the left foot and patient would like to do this next week. 7/19; patient presents for follow-up. She has been using Keystone antibiotics with calcium alginate to the wound beds. She has no issues or complaints today. Patient is in agreement to do the total contact cast of the left foot today. She knows to return later this week for the obligatory cast change. 05-13-2022 upon evaluation today patient's wound which she has the cast of the left leg actually appears to be doing significantly better. Fortunately I do not see any signs of active infection locally or systemically which is great news and overall I am extremely pleased with where we stand currently. 7/26; patient presents for follow-up. She has [name] cast in place for the past week. She states it irritated her shin. Other than that she tolerated the cast well. She states she would like [name] break for 1 week from the cast. We have been using Keystone antibiotic and Aquacel to both wound beds. She denies signs of infection. 8/2; patient presents for  follow-up. She has been using Keystone and Aquacel to the wound beds. She denies any issues and has no complaints. She is agreeable to have the cast placed today for the left leg. 06-03-2022 upon evaluation today patient appears to be doing well with regard to her wound she saw some signs of improvement which is great news. Fortunately I do not see any evidence of active infection locally or systemically at this time which is great news. No fevers, chills, nausea, vomiting, or diarrhea. 8/16; patient presents for follow-up. She has no issues or complaints today. We have been using Keystone and Aquacel to the wound beds. The left lower extremity is in [name] total contact cast. She is tolerated this well. 8/23; patient presents for follow-up. She has had the total contact cast on the left leg for the past week. Unfortunately this has rubbed and broken down the skin to the medial foot. She currently denies signs of infection. She has been using Keystone antibiotic to the right plantar  foot wound. 8/30; patient presents for follow-up. We have held off on the total contact cast for the left leg for the past week. Her wound on the left foot has improved and the previous surrounding breakdown of skin has epithelialized. She has been using Keystone antibiotic to both wound beds. She has no issues or complaints today. She denies signs of infection. 9/6; patient presents for follow-up. She has ordered her's Keystone antibiotic and this is arriving this week. She has been doing Dakin's wet-to-dry dressings to the wound beds. She denies signs of infection. She is agreeable to the total contact cast today. 9/13; patient presents for follow-up. She states that the cast caused her left leg shin to be sore. She would like to take [name] break from the cast this week. She has been using Keystone antibiotic to the right plantar foot wound. She denies signs of infection. 9/20; patient presents for follow-up. She has been using  Keystone antibiotics to the wound beds with calcium alginate to the right foot wound and Hydrofera Blue to the left foot wound. She is agreeable to having the cast placed today. She has been approved for Apligraf and we will order this for next clinic visit. 9/27; patient presents for follow-up. We have been using Keystone antibiotic with Hydrofera Blue to the left foot wound under [name] total contact cast. T the right o foot wound she has been using Keystone antibiotic and calcium alginate. She declines [name] total contact cast today. Apligraf is available for placement and she would like to proceed with this. 07-28-2022 upon evaluation today patient appears to be doing well currently in regard to her wound. She is actually showing signs of significant improvement which is great news. Fortunately I do not see any evidence of active infection locally nor systemically at this time. She has been seeing Dr. Mikey Bussing and to be honest has been doing very well with the cast. Subsequently she comes in today with [name] cast on and we did reapply that today as well. She did not really want to she try to talk me out of that but I explained that if she wanted to heal this is really the right way to go. Patient voiced understanding. In regard to her right foot this is actually [name] lot better compared to the last time I saw her which is also great news. 10/11; patient presents for follow-up. Apligraf and the total contact cast was placed to the left leg at last clinic visit. She states that her right foot wound had burning pain to it with the placement of Apligraf to this area. She has been doing [place] over this area. She denies signs of infection including increased warmth, erythema or purulent drainage. 11/1; 3-week follow-up. The patient fortunately did not have [name] total contact cast or an Apligraf and on the left foot. She has been using Keystone ABD pads and kerlix and her own running shoes She arrives in clinic today  with thick callus and [name] very poor surface on the left foot on the right nonviable skin subcutaneous tissue and [name] deep probing hole. 11/15; patient missed her last clinic appointment. She states she has not been dressing the wound beds for the past 2 weeks. She states that at she had [name] new roommate but is now going back to live with her mother. Apparently its been [name] distracting 2 weeks. Patient currently denies signs of infection. 11/22; patient presents for follow-up. She states she has been using Keystone antibiotic and Dakin's  wet-to-dry dressings to the wound beds. She is agreeable for cast placement today. We had ordered Apligraf however this has not been received by our facility. 11/29; Patient had [name] total contact cast placed at last clinic visit and she tolerated this well. We were using silver alginate under the cast. Patient's been using Keystone antibiotic with Aquacel to the right plantar foot wound. She has no issues or complaints today. Apligraf is available for placement today. Patient would like to proceed with this. NIKCOLE, GRAF [name] ([id number]) [id number] Page 18 of 21 12/6; patient presents for follow-up. She had Apligraf placed in standard fashion last clinic visit under the total contact cast to the left lower extremity. She has been using Keystone antibiotic and Aquacel to the right plantar foot wound. She has no issues or complaints today. 12/13; patient presents for follow-up. She has finished 5 Apligraf placements. Was told she would not qualify for more. We have been doing [name] total contact cast to the left lower extremity. She has been using Keystone antibiotic and Aquacel to the right plantar foot wound. She has no issues or complaints today. 12/20; patient presents for follow-up. We have been using Hydrofera Blue with Keystone antibiotic under [name] total contact cast of the left lower extremity. She reports using Keystone antibiotic and silver alginate  to the right heel wound. She has no issues or complaints today. 12/27; patient presents with [name] healthy wound on the left midfoot. We have Apligraf to apply that to that more also using [name] total contact cast. On the right we are using Keystone and silver alginate. She is offloading the right heel with [name] surgical shoealthough by her admission she is on her feet quite [name] bit 1/3; patient presents for follow-up. Apligraf was placed to the wound beds last clinic visit. She was placed in [name] total contact cast to the left lower extremity. She declines [name] total contact cast today. She states that her mother is in the hospital and she cannot adequately get around with the cast on. 1/10; patient presents for follow-up. She declined the total contact cast at last clinic visit. Both wounds have declined in appearance. She states that she has been on her feet and not offloading the wound beds. She currently denies signs of infection. 1/17; patient presents for follow-up. She had the total contact cast along with Apligraf placed last week to the left lower extremity. She tolerated this well. She has been using Aquacel Ag and Keystone antibiotic to the right heel wound. She currently denies signs of infection. She has no issues or complaints today. 1/24; patient presents for follow-up. We have been using Apligraf to the left foot wound along with [name] total contact cast. She has done well with this. T the right o heel wound she has been using Aquacel Ag and Keystone antibiotic ointment. She has no issues or complaints today. She denies signs of infection. 1/31; patient presents for follow-up. We have been using Apligraf to the left foot wound along with the total contact cast. She continues to do well with this. To the right heel we have been using Aquacel Ag and Keystone antibiotic ointment. She has no issues or complaints today. 12-01-2022 upon evaluation patient is seen today on my schedule due to the fact that she  unfortunately was in the hospital yesterday. Her cast needed to come off the only reason she is out of the hospital is due to the fact that they would not take it off at the hospital  which is somewhat bewildered reading to me to be perfectly honest. I am not certain why this was but either way she was released and then was placed on my schedule today in order to get this off and reapply the total contact casting as appropriate. I do not have an Apligraf for her today it was applied last week and today's actually expired yesterday as there was some scheduling conflicts with her being in the hospital. Nonetheless we do not have that for reapplication today but the good news that she is not draining too much and the Apligraf can go for up to 2 weeks so I am going to go ahead and reapply the total contact casting but we are going to leave the Apligraf in place. 2/14; patient presents for follow-up. T the left leg she has had the total contact cast and Apligraf for the past week. She has had no issues with this. T the o o right heel she has been using antibiotic ointment and Aquacel Ag. 2/21; patient presents for follow-up. We have been using Apligraf and [name] total contact cast to the left lower extremity. She is tolerated this well. Unfortunately she is not approved for any more Apligraf per insurance. She has been using antibiotic ointment and Aquacel Ag to the right foot. She has no issues or complaints today. 2/28; patient presents for follow-up. We have been using Hydrofera Blue and antibiotic ointment under the total contact cast to the left lower extremity. He has been using Aquacel Ag with antibiotic ointment to the right plantar foot. She has no issues or complaints today. 3/6; patient presents for follow-up. She did not obtain her gentamicin ointment. She has been using Aquacel Ag to the right plantar foot wound. We have been using Hydrofera Blue with antibiotic ointment under the total contact  cast to the left lower extremity. She has no issues or complaints today. 3/12; patient presents for follow-up. She has been using gentamicin ointment and Aquacel Ag to the right plantar foot wound. We have been using Hydrofera Blue with antibiotic ointment under the total contact cast on the left lower extremity foot wound. She has no issues or complaints today. 3/20; patient presents for follow-up. She has been using gentamicin ointment and Aquacel Ag to the right plantar foot wound. We have been using Hydrofera Blue with antibiotic ointment under the total contact cast to the left lower extremity wound. She has no issues or complaints today. 3/27; patient presents for follow-up. We have been using antibiotic ointment and Aquacel Ag to the right plantar foot wound. We have been using Hydrofera Blue with antibiotic ointment under the total contact cast to the left lower extremity. Both wounds are smaller. 01-24-2023 upon evaluation today patient actually appears to be doing better compared to last time I saw her. It has been several weeks since I last saw her but nonetheless I think the total contact casting is doing [name] good job. Fortunately I do not see any evidence of infection or worsening overall which is great news and in general I do believe that she is moving in the appropriate direction. It has been [name] very long road but nonetheless I feel like she is nearing the end at least in regard to the left foot and even the right foot looks much better to be perfectly honest. 01-31-2023 upon evaluation today patient appears to be doing well currently in regard to her wounds. She has been require some sharp debridement which I think is probably  bone both sides to remove [name] lot of callus that has built up at this point. I discussed that with her today. Also think total contact cast is doing well for the left foot Emina continue that as well. 4/17; patient presents for follow-up. We have been doing Hydrofera Blue  with antibiotic ointment under the total contact cast to the left lower extremity and Aquacel Ag with antibiotic ointment to the right foot wound. She has been using her offloading heel shoe here. 4/24; patient presents for follow-up. We have been doing Hydrofera Blue with antibiotic ointment under the total contact cast to the left lower extremity and Aquacel Ag with antibiotic ointment to the right foot wound. She has been using her offloading heel shoe here. Wounds are smaller with slough accumulation. 5/1; patient presents for follow-up. We have been doing Hydrofera Blue with antibiotic ointment under the total contact cast to the left lower extremity and Aquacel Ag with antibiotic ointment to the right foot wound. Left foot wound is stable and right foot wound appears smaller. 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 On the left she is using Hydrofera Blue topical antibiotic under [name] total contact cast, on the right plantar heel gentamicin Aquacel Ag. According to our intake nurse both are making slow but steady improvements. 5/15; patient presents for follow-up. We have been doing Hydrofera Blue with antibiotic ointment under the total contact cast on the left and antibiotic ointment with Aquacel Ag on the right. Unfortunately the left foot wound has declined in size and appearance. We also do not have the total contact cast available in office today. Patient denies signs of infection. 5/22; patient presents for follow-up. We have been doing Hydrofera Blue and antibiotic ointment to the left foot and Aquacel Ag with antibiotic ointment to the right foot. We took [name] break from the cast since we did not have it placed at last week. Wounds look stable if not slightly improved. 03-27-2023 patient presents today she has had this For Most 2 Weeks Because She Was in the Hospital and They Did Not T It off. Again That Is Really Not ake Something That She Could Control but Nonetheless We Are Seeing Her at  This Point Today for Reevaluation. We Do Want to Go and See about Reapply the Cast. Again We Had All Set Everything up to Go Ahead and Do This When We Went to Get the Cast Only to Realize That Not Had Come in and We Were out at the Moment. With That Being [place] Spaniel We Will Get Have T Get the Exam before We Can Reapply the T Contact Casting Going Forward. o otal 6/12; patient presents for follow-up. She has been using Hydrofera Blue to the left foot wound and Aquacel Ag to the right foot wound. No cast was available last week placed on the patient so she has been offloading with her surgical shoe. She has no issues or complaints today. LISHIA, CLENDENNEN [name] ([id number]) [id number] Page 19 of 21 6/19; patient presents for follow-up. We have been using Hydrofera Blue to the left foot wound under the total contact cast and Aquacel Ag to the right wound. She has no issues or complaints today. The wounds are smaller. 6/26; patient presents for follow-up. We have been using Hydrofera Blue to the left foot wound under the total contact cast and Aquacel Ag to the right foot wound. Wounds are smaller. Patient has no issues or complaints today. 7/3; patient presents for follow-up. We have been using Hydrofera  Blue to the left foot wound under the total contact cast and Aquacel Ag to the right foot wound. Wounds Appear smaller. No signs of infection. 7/10; patient presents for follow-up. We have been using Hydrofera Blue to the left foot wound under the total contact cast and Aquacel Ag to the right foot wound. Left foot wound is smaller. Right foot wound is stable. 7/17; patient presents for follow-up. We have been using Hydrofera Blue to the left foot under the total contact cast and Aquacel Ag to the right foot wound. She has no issues or complaints today. 7/24; patient presents for follow-up. We have been using Hydrofera Blue to the left foot under the total contact cast and Aquacel Ag to  the right foot wound. Unfortunately she was bit by her dog over the weekend and developed [name] wound to the right lower leg. She has been keeping the area covered. 7/31; patient presents for follow-up. We have been using Hydrofera Blue to the left foot under the total contact cast and Aquacel Ag to the right foot wound and mupirocin ointment to the right anterior leg wound. She started her oral antibiotics 2 days ago. 8/7; patient presents for follow-up. We have been using Hydrofera Blue and antibiotic ointment to the left foot under the total contact cast and Aquacel Ag to the right foot wound. Antibiotic ointment to the right anterior leg wound. She completed her oral antibiotics. She has no issues or complaints today. Wounds continue to improve albeit slowly. 8/14; 8/14; patient has 3 wounds 1 on the left plantar heel 1 on the right plantar heel and [name] small wound on the right anterior lower leg. We have been using Hydrofera Blue in the left heel Aquacel on the right heel. The left heel has been in [name] total contact cast 8/21; patient presents for follow-up. We have been using Hydrofera Blue to the left foot wound under the total contact cast. T the right heel patient has been o using Aquacel Ag. T the anterior leg wound antibiotic ointment. All wounds have healed. o 8/28; patient presents for follow-up. She has been using her surgical shoe with offloading felt pads. Her wounds have remained closed. 9/11; patient presents for final follow-up to assure that her wounds have remained closed. Unfortunately the left foot wound has opened again. But she has been wearing her Surgical shoe for offloading with felt pad. 07-14-2023 upon evaluation today patient appears to be doing well currently in regard to her wound. She has been tolerating the dressing changes without complication with total contact casting. I am going to be helping take care of her at this point as she is transferring from Dr. Mikey Bussing to me  since Dr. Mikey Bussing is on maternity leave. 07-24-2023 upon evaluation today patient appears to be doing decently well currently in regard to her wound. She is actually showing signs of improvement in regard to the left foot and I feel like this is much better than last week no debridement even necessary today. We may have to perform some slight debridement come next week we will see how things go. Monitor for any signs of worsening overall and obviously if anything changes she should be letting us know. 08-01-2023 upon evaluation today patient appears to be doing excellent in regard to her feet. The right foot/heel area is completely healed she does have some callus buildup I am going to trim this down [name] little bit I do not want to build up to the point of cracking  she is doing well with the urea cream. With regard to the left heel she is doing well with casting I am going to perform some debridement here to clear away before reapplying the cast and I think she is really doing quite well though. 08-07-2023 upon evaluation patient's wound actually showed signs of excellent improvement and actually very pleased with where we stand I do believe that she is making good headway towards complete closure which is great news. 08-14-2023 upon evaluation today patient appears to be doing well currently in regard to her wound. She actually is going require [name] little bit of debridement but fortunately nothing too significant today actually feel like she is doing much better. This is mainly to remove callus on the little slough and biofilm down to good subcutaneous tissue. In general I think that she is making excellent headway here. 08-21-2023 upon evaluation today patient appears to be doing well currently in regard to her wound which is actually showing signs of significant improvement. I am extremely pleased with where things stand I do not see any signs of infection at this time which is great news and overall I  do think that we are making headway towards complete closure which is also excellent news. 08-30-2023 upon evaluation today patient's wound is showing signs of continued improvement. Fortunately I do not see any evidence of worsening overall and I believe that the patient is making excellent headway towards closure. I am extremely pleased with where we stand. 09-04-2023 upon evaluation today patient appears to be doing well currently in regard to her foot ulcer. She has been tolerating the dressing changes without complication. Fortunately I do not see any signs of active infection locally or systemically at this time which is great news. No fevers, chills, nausea, vomiting, or diarrhea. 09-11-2023 upon evaluation today patient appears to be doing well currently in regard to her wound on the left foot. I feel like she is actually showing signs of improvement I feel like the collagen is doing [name] good job. With that being said unfortunately she has an area where there is [name] spot on the right foot that has not really open but at least there looks like there may have been something that popped off of this area. 09-18-2023 upon evaluation today patient appears to be doing well currently in regard to her wound although to be honest she still continues to have issues here with the wound not healing as rapidly as I would expect. She does [name] lot of walking on the cast from what I am hearing today and to be honest as I mention to her previously this cast is not exactly appropriate for her based on the size of her leg is technically too small and I think this is causing issues with rubbing and therefore callus buildup that would not otherwise occur with [name] properly fitting cast. I discussed that with her today and I think that potentially having [name] offloading shoe would be better specifically [name] front offloading boot. 09-25-2023 upon evaluation patient's wound is showing signs of improvement which is great news and in  general I do feel like that she is really doing quite well. I do not see any signs of active infection at this time which is excellent and in general I think that we are moving in the right direction here. She did get the offloading boot off of [employer] that working to get [name] try it is [name] front off loader which I think should  do quite well and we will get [name] see how that does over the next week. 10-02-2023 upon evaluation today patient's wound actually showing signs of good improvement. I actually feel like it is overall looking better there is still quite [name] bit of callus but I did go ahead and perform debridement today to clear this away. She in general seems to be doing quite well. HARLOWE, MIKITA [name] ([id number]) [id number] Page 20 of 21 Objective Constitutional Well-nourished and well-hydrated in no acute distress. Vitals Time Taken: 9:06 AM, Height: 69 in, Weight: 178 lbs, BMI: 26.3, Temperature: 98.7 F, Pulse: 73 bpm, Respiratory Rate: 16 breaths/min, Blood Pressure: 106/65 mmHg. Respiratory normal breathing without difficulty. Psychiatric this patient is able to make decisions and demonstrates good insight into disease process. Alert and Oriented x 3. pleasant and cooperative. General Notes: Upon inspection patient's wound bed actually showed signs of good granulation and epithelization at this p. Fortunately I do not see any signs of infection at this time which is great news and in general I think there were making good headway here towards closure. Integumentary (Hair, Skin) Wound #12R status is Open. Original cause of wound was Pressure Injury. The date acquired was: 03/16/2020. The wound has been in treatment 80 weeks. The wound is located on the Left,Medial,Plantar Foot. The wound measures 0.4cm length x 0.3cm width x 0.2cm depth; 0.094cm^2 area and 0.019cm^3 volume. There is Fat Layer (Subcutaneous Tissue) exposed. There is [name] medium amount of serosanguineous  drainage noted. The wound margin is flat and intact. There is no granulation within the wound bed. There is no necrotic tissue within the wound bed. 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 There was [name] Excisional Skin/Subcutaneous Tissue Debridement with [name] total area of 0.12 sq cm performed by Heather Derry, PA-C. With the following instrument(s): Curette to remove Viable and Non-Viable tissue/material. Material removed includes Callus, Subcutaneous Tissue, and Slough. [name] time out was conducted at 09:47, prior to the start of the procedure. [name] Minimum amount of bleeding was controlled with Pressure. The procedure was tolerated well. Post Debridement Measurements: 0.5cm length x 0.3cm width x 0.2cm depth; 0.024cm^3 volume. Character of Wound/Ulcer Post Debridement is stable. Severity of Tissue Post Debridement is: Fat layer exposed. Post procedure Diagnosis Wound #12R: Same as Pre-Procedure Plan Follow-up Appointments: Return Appointment in 1 week. Bathing/ Shower/ Hygiene: May shower; gently cleanse wound with antibacterial soap, rinse and pat dry prior to dressing wounds No tub bath. Off-Loading: Open toe surgical shoe - right foot Other: - front offloading boot left foot WOUND #12R: - Foot Wound Laterality: Plantar, Left, Medial Cleanser: Vashe 5.8 (oz) Every Other Day/30 Days Discharge Instructions: Use vashe 5.8 (oz) as directed Prim Dressing: Promogran Matrix 4.34 (in) Every Other Day/30  Days ary Discharge Instructions: Moisten w/normal saline or sterile water and apply into wound bed Secondary Dressing: Gauze Every Other Day/30 Days Discharge Instructions: As directed: dry, moistened with saline or moistened with Dakins Solution Secured With: Medipore T - 30M Medipore H Soft Cloth Surgical T ape ape, 2x2 (in/yd) Every Other Day/30 Days Secured With: [employer]  Roll Sterile or Non-Sterile 6-ply 4.5x4 (yd/yd) Every Other Day/30 Days Discharge Instructions: Apply Kerlix as directed Secured With: Stretch Net Dressing, Latex-free, Size 5, Small-Head / Shoulder / Thigh Every Other Day/30 Days CARSYNN, LEAPHART [name] ([id number]) [phone].pdf Page 21 of 21 1. I would recommend based on what we are seeing that we have the patient going continue to monitor for any signs of infection or worsening. Based on what I see I do feel like that we are using [name] collagen based dressing here which is the best way to go. 2. Also can recommend the patient should continue with [name] roll gauze to secure in place. 3. I am going to recommend that she should continue with her offloading boot which actually seems to be doing better in my opinion than the cast was the cast was causing some areas of bruising and issue this actually looks much cleaner. We will see patient back for reevaluation in 1 week here in the clinic. If anything worsens or changes patient will contact our office for additional recommendations. Electronic Signature(s) Signed: 10/02/2023 10:12:37 AM By: Heather Derry PA-C Entered By: [name] on 10/02/2023 10:12:36 -------------------------------------------------------------------------------- SuperBill Details Patient Name: Date of Service: [name] 10/02/2023 Medical Record Number: [id number] Patient Account Number: [id number] Date of Birth/Sex: Treating [name]: [birth year] (47 y.o. [name] Primary Care Provider: Lindwood [name] Other Clinician: Betha [name] Referring Provider: Treating Provider/Extender: [name] Weeks in Treatment: 38 Diagnosis Coding ICD-10 Codes Code Description E11.621 Type 2 diabetes mellitus with foot ulcer L97.528 Non-pressure chronic ulcer of other part of left foot with other specified severity E11.42 Type 2 diabetes mellitus with diabetic polyneuropathy Z79.4 Long term (current) use of insulin N18.6 End stage renal disease Z99.2 Dependence on renal dialysis L84 Corns and callosities Facility Procedures : CPT4 Code: [id number] Description: 11042 - DEB SUBQ TISSUE 20 SQ CM/< ICD-10 Diagnosis Description L97.528 Non-pressure chronic ulcer of other part of left foot with other specified seve Modifier: rity Quantity: 1 Physician Procedures : CPT4 Code Description Modifier 11042 11042 - WC PHYS SUBQ TISS 20 SQ CM ICD-10 Diagnosis Description L97.528 Non-pressure chronic ulcer of other part of left foot with other specified severity Quantity: 1 Electronic Signature(s) Signed: 10/02/2023 10:14:32 AM By: Heather Derry PA-C Entered By: [name] on 10/02/2023 10:14:31

## 2023-10-03 NOTE — Progress Notes (Signed)
MARYSUE, ANTONINO A ([id number]) [id number] Page 1 of 8 Visit Report for 10/02/2023 Arrival Information Details Patient Name: Date of Service: DINIA, ZIMMERLE 10/02/2023 9:00 A M Medical Record Number: [id number] Patient Account Number: [id number] Date of Birth/Sex: Treating RN: 20-Aug-1976 (47 y.o. [name] Primary Care Raye Wiens: [name] Other Clinician: [name] Referring Tessah Patchen: Treating Nautica Hotz/Extender: [name] in Treatment: 5 Visit Information History Since Last Visit All ordered tests and consults were completed: No Patient Arrived: Wheel Chair Added or deleted any medications: No Arrival Time: 09:02 Any new allergies or adverse reactions: No Accompanied By: mother Had a fall or experienced change in No Transfer Assistance: None activities of daily living that may affect Patient Identification Verified: Yes risk of falls: Secondary Verification Process Completed: Yes Signs or symptoms of abuse/neglect since last visito No Patient Requires Transmission-Based Precautions: No Hospitalized since last visit: No Patient Has Alerts: No Implantable device outside of the clinic excluding No cellular tissue based products placed in the center since last visit: Has Dressing in Place as Prescribed: Yes Has Footwear/Offloading in Place as Prescribed: Yes Left: Other:front offloading boot Pain Present Now: No Electronic Signature(s) Signed: 10/02/2023 4:48:25 PM By: [name] Entered By: [name] on 10/02/2023 06:06:33 -------------------------------------------------------------------------------- Clinic Level of Care Assessment Details Patient Name: Date of Service: FARHA, MESSMAN 10/02/2023 9:00 A M Medical Record Number: [id number] Patient Account Number: [id number] Date of Birth/Sex: Treating RN: [birth year] (47 y.o. [name] Primary Care Jorgia Manthei: [name] Other Clinician:  [name] Referring Othon Guardia: Treating Shatarra Wehling/Extender: [name] in Treatment: 29 Clinic Level of Care Assessment Items TOOL 1 Quantity Score []  - 0 Use when EandM and Procedure is performed on INITIAL visit ASSESSMENTS - Nursing Assessment / Reassessment []  - 0 General Physical Exam (combine w/ comprehensive assessment (listed just below) when performed on new pt. evalsRANITA, SPRINGER A ([id number]) [id number] Page 2 of 8 []  - 0 Comprehensive Assessment (HX, ROS, Risk Assessments, Wounds Hx, etc.) ASSESSMENTS - Wound and Skin Assessment / Reassessment []  - 0 Dermatologic / Skin Assessment (not related to wound area) ASSESSMENTS - Ostomy and/or Continence Assessment and Care []  - 0 Incontinence Assessment and Management []  - 0 Ostomy Care Assessment and Management (repouching, etc.) PROCESS - Coordination of Care []  - 0 Simple Patient / Family Education for ongoing care []  - 0 Complex (extensive) Patient / Family Education for ongoing care []  - 0 Staff obtains Chiropractor, Records, T Results / Process Orders est []  - 0 Staff telephones HHA, Nursing Homes / Clarify orders / etc []  - 0 Routine Transfer to another Facility (non-emergent condition) []  - 0 Routine Hospital Admission (non-emergent condition) []  - 0 New Admissions / Manufacturing engineer / Ordering NPWT Apligraf, etc. , []  - 0 Emergency Hospital Admission (emergent condition) PROCESS - Special Needs []  - 0 Pediatric / Minor Patient Management []  - 0 Isolation Patient Management []  - 0 Hearing / Language / Visual special needs []  - 0 Assessment of Community assistance (transportation, D/C planning, etc.) []  - 0 Additional assistance / Altered mentation []  - 0 Support Surface(s) Assessment (bed, cushion, seat, etc.) INTERVENTIONS - Miscellaneous []  - 0 External ear exam []  - 0 Patient Transfer (multiple staff / Nurse, adult / Similar  devices) []  - 0 Simple Staple / Suture removal (25 or less) []  - 0 Complex Staple / Suture removal (26 or more) []  - 0 Hypo/Hyperglycemic Management (do not check if billed separately) []  -  0 Ankle / Brachial Index (ABI) - do not check if billed separately Has the patient been seen at the hospital within the last three years: Yes Total Score: 0 Level Of Care: ____ Electronic Signature(s) Signed: 10/02/2023 4:48:25 PM By: [name] Entered By: [name] on 10/02/2023 06:50:44 -------------------------------------------------------------------------------- Encounter Discharge Information Details Patient Name: Date of Service: [name], Silva Bandy A. 10/02/2023 9:00 A M Medical Record Number: [id number] Patient Account Number: [id number] Date of Birth/Sex: Treating RN: [birth year] (47 y.o. Anges, Carrigan, Darienne A ([id number]) [id number] Page 3 of 8 Primary Care [name] Montesano: [name] Other Clinician: [name] Referring Gizell Danser: Treating Anaka Beazer/Extender: [name] in Treatment: 38 Encounter Discharge Information Items Post Procedure Vitals Discharge Condition: Stable Temperature (F): 98.7 Ambulatory Status: Wheelchair Pulse (bpm): 73 Discharge Destination: Home Respiratory Rate (breaths/min): 16 Transportation: Private Auto Blood Pressure (mmHg): 106/65 Accompanied By: mother Schedule Follow-up Appointment: Yes Clinical Summary of Care: Electronic Signature(s) Signed: 10/02/2023 4:48:25 PM By: [name] Entered By: [name] on 10/02/2023 07:44:01 -------------------------------------------------------------------------------- Lower Extremity Assessment Details Patient Name: Date of Service: KARIMA, LEHNHOFF 10/02/2023 9:00 A M Medical Record Number: [id number] Patient Account Number: [id number] Date of Birth/Sex: Treating RN: 1976-06-22 (47 y.o. [name] Primary Care Kian Gamarra: [name] Other Clinician: [name] Referring Ronie Barnhart: Treating Casson Catena/Extender: [name] Weeks in Treatment: 13 Electronic Signature(s) Signed: 10/02/2023 4:48:25 PM By: [name] Signed: 10/03/2023 3:25:36 PM By: Yevonne Pax RN Entered By: [name] on 10/02/2023 06:15:15 -------------------------------------------------------------------------------- Multi Wound Chart Details Patient Name: Date of Service: [name] 10/02/2023 9:00 A M Medical Record Number: [id number] Patient Account Number: [id number] Date of Birth/Sex: Treating RN: [birth year] (47 y.o. [name] Primary Care Marquelle Balow: [name] Other Clinician: [name] Referring Adrionna Delcid: Treating Liseth Wann/Extender: [name] in Treatment: 4 Vital Signs Height(in): 69 Pulse(bpm): 73 Weight(lbs): 178 Blood Pressure(mmHg): 106/65 Body Mass Index(BMI): 26.3 Temperature(F): 98.7 Respiratory Rate(breaths/min): 16 [facility], Adele A ([id number]) [id number] Page 4 of 8 [12R:Photos:] [N/A:N/A] Left, Medial, Plantar Foot N/A N/A Wound Location: Pressure Injury N/A N/A Wounding Event: Diabetic Wound/Ulcer of the Lower N/A N/A Primary Etiology: Extremity Chronic sinus problems/congestion, N/A N/A Comorbid History: Middle ear problems, Anemia, Chronic Obstructive Pulmonary Disease (COPD), Congestive Heart Failure, Type II Diabetes, End Stage Renal Disease, History of pressure wounds, Neuropathy 03/16/2020 N/A N/A Date Acquired: 54 N/A N/A Weeks of Treatment: Open N/A N/A Wound Status: Yes N/A N/A Wound Recurrence: 0.4x0.3x0.2 N/A N/A Measurements L x W x D (cm) 0.094 N/A N/A A (cm) : rea 0.019 N/A N/A Volume (cm) : 98.00% N/A N/A % Reduction in A rea: 98.00% N/A N/A % Reduction in Volume: Grade 3 N/A N/A Classification: Medium N/A N/A Exudate A mount: Serosanguineous N/A N/A Exudate Type: red, brown  N/A N/A Exudate Color: Flat and Intact N/A N/A Wound Margin: None Present (0%) N/A N/A Granulation A mount: None Present (0%) N/A N/A Necrotic A mount: Fat Layer (Subcutaneous Tissue): Yes N/A N/A Exposed Structures: Fascia: No Tendon: No Muscle: No Joint: No Bone: No Medium (34-66%) N/A N/A Epithelialization: Treatment Notes Electronic Signature(s) Signed: 10/02/2023 4:48:25 PM By: [name] Entered By: [name] on 10/02/2023 06:15:23 -------------------------------------------------------------------------------- Multi-Disciplinary Care Plan Details Patient Name: Date of Service: [name] 10/02/2023 9:00 A M Medical Record Number: [id number] Patient Account Number: [id number] Date of Birth/Sex: Treating RN: 18-Sep-1976 (47 y.o. [name] Primary Care Alaine Loughney: [name] Other Clinician: [name] Referring Neyla Gauntt: Treating  Takoya Jonas/Extender: [name] Weeks in Treatment: [address] MYRICLE, MELLING A ([id number]) [id number] Page 5 of 8 Abuse / Safety / Falls / Self Care Management Nursing Diagnoses: History of Falls Potential for falls Potential for injury related to falls Goals: Patient will not develop complications from immobility Date Initiated: 03/17/2022 Date Inactivated: 04/20/2022 Target Resolution Date: 03/16/2022 Goal Status: Met Patient/caregiver will verbalize understanding of skin care regimen Date Initiated: 03/17/2022 Date Inactivated: 09/28/2022 Target Resolution Date: 03/16/2022 Goal Status: Met Patient/caregiver will verbalize/demonstrate measure taken to improve self care Date Initiated: 03/17/2022 Target Resolution Date: 06/21/2023 Goal Status: Active Interventions: Assess fall risk on admission and as needed Provide education on basic hygiene Provide education on personal and home safety Notes: Electronic Signature(s) Signed: 10/02/2023 4:48:25 PM By: [name] Signed: 10/03/2023 3:25:36 PM By: Yevonne Pax RN Entered By: [name] on 10/02/2023 06:50:58 -------------------------------------------------------------------------------- Pain Assessment Details Patient Name: Date of Service: [name] 10/02/2023 9:00 A M Medical Record Number: [id number] Patient Account Number: [id number] Date of Birth/Sex: Treating RN: 06-10-1976 (47 y.o. [name] Primary Care Brinae Woods: [name] Other Clinician: [name] Referring Jamaree Hosier: Treating Doll Frazee/Extender: [name] Weeks in Treatment: 51 Active Problems Location of Pain Severity and Description of Pain Patient Has Paino No Site Locations [place], [place] A ([id number]) [id number] Page 6 of 8 Pain Management and Medication Current Pain Management: Electronic Signature(s) Signed: 10/02/2023 4:48:25 PM By: [name] Signed: 10/03/2023 3:25:36 PM By: Yevonne Pax RN Entered By: [name] on 10/02/2023 06:13:44 -------------------------------------------------------------------------------- Patient/Caregiver Education Details Patient Name: Date of Service: [name] 12/9/2024andnbsp9:00 A M Medical Record Number: [id number] Patient Account Number: [id number] Date of Birth/Gender: Treating RN: 02-Jul-1976 (47 y.o. [name] Primary Care Physician: [name] Other Clinician: [name] Referring Physician: Treating Physician/Extender: [name] in Treatment: 27 Education Assessment Education Provided To: Patient Education Topics Provided Wound/Skin Impairment: Handouts: Other: continue wound care as directed Methods: Explain/Verbal Responses: State content correctly Electronic Signature(s) Signed: 10/02/2023 4:48:25 PM By: [name] Entered By: [name] on 10/02/2023 06:51:15 Earmon Phoenix A ([id number]) [id number] Page 7  of 8 -------------------------------------------------------------------------------- Wound Assessment Details Patient Name: Date of Service: BRUNETTA, HUSBY 10/02/2023 9:00 A M Medical Record Number: [id number] Patient Account Number: [id number] Date of Birth/Sex: Treating RN: 1975/12/10 (47 y.o. [name] Primary Care Casten Floren: [name] Other Clinician: [name] Referring Giulianna Rocha: Treating Brooks Stotz/Extender: [name] Weeks in Treatment: 80 Wound Status Wound Number: 12R Primary Diabetic Wound/Ulcer of the Lower Extremity Etiology: Wound Location: Left, Medial, Plantar Foot Wound Open Wounding Event: Pressure Injury Status: Date Acquired: 03/16/2020 Comorbid Chronic sinus problems/congestion, Middle ear problems, Anemia, Weeks Of Treatment: 80 History: Chronic Obstructive Pulmonary Disease (COPD), Congestive Heart Clustered Wound: No Failure, Type II Diabetes, End Stage Renal Disease, History of pressure wounds, Neuropathy Photos Wound Measurements Length: (cm) 0.4 Width: (cm) 0.3 Depth: (cm) 0.2 Area: (cm) 0.09 Volume: (cm) 0.01 % Reduction in Area: 98% % Reduction in Volume: 98% Epithelialization: Medium (34-66%) 4 9 Wound Description Classification: Grade 3 Wound Margin: Flat and Intact Exudate Amount: Medium Exudate Type: Serosanguineous Exudate Color: red, brown Foul Odor After Cleansing: No Slough/Fibrino Yes Wound Bed Granulation Amount: None Present (0%) Exposed Structure Necrotic Amount: None Present (0%) Fascia Exposed: No Fat Layer (Subcutaneous Tissue) Exposed: Yes Tendon Exposed: No Muscle Exposed: No Joint Exposed: No Bone Exposed: No Treatment Notes Wound #12R (Foot) Wound Laterality: Plantar, Left, Medial Cleanser Vashe 5.8 (  oz) Discharge Instruction: Use vashe 5.8 (oz) as directed CHARDEE, KICINSKI A ([id number]) [id number] Page 8 of 8 Peri-Wound Care Topical Primary  Dressing Promogran Matrix 4.34 (in) Discharge Instruction: Moisten w/normal saline or sterile water and apply into wound bed Secondary Dressing Gauze Discharge Instruction: As directed: dry, moistened with saline or moistened with Dakins Solution Secured With Medipore T - 13M Medipore H Soft Cloth Surgical T ape ape, 2x2 (in/yd) Kerlix Roll Sterile or Non-Sterile 6-ply 4.5x4 (yd/yd) Discharge Instruction: Apply Kerlix as directed Stretch Net Dressing, Latex-free, Size 5, Small-Head / Shoulder / Thigh Compression Wrap Compression Stockings Add-Ons Electronic Signature(s) Signed: 10/02/2023 4:48:25 PM By: [name] Signed: 10/03/2023 3:25:36 PM By: Yevonne Pax RN Entered By: [name] on 10/02/2023 06:15:02 -------------------------------------------------------------------------------- Vitals Details Patient Name: Date of Service: [name], Ramandeep A. 10/02/2023 9:00 A M Medical Record Number: [id number] Patient Account Number: [id number] Date of Birth/Sex: Treating RN: 20-Feb-1976 (47 y.o. [name] Primary Care Yuliza Cara: [name] Other Clinician: [name] Referring Tiari Andringa: Treating Veer Elamin/Extender: [name] in Treatment: 110 Vital Signs Time Taken: 09:06 Temperature (F): 98.7 Height (in): 69 Pulse (bpm): 73 Weight (lbs): 178 Respiratory Rate (breaths/min): 16 Body Mass Index (BMI): 26.3 Blood Pressure (mmHg): 106/65 Reference Range: 80 - 120 mg / dl Electronic Signature(s) Signed: 10/02/2023 4:48:25 PM By: [name] Entered By: [name] on 10/02/2023 06:11:04

## 2023-10-09 ENCOUNTER — Encounter: Payer: MEDICAID | Admitting: Physician Assistant

## 2023-10-09 DIAGNOSIS — E11621 Type 2 diabetes mellitus with foot ulcer: Secondary | ICD-10-CM | POA: Diagnosis not present

## 2023-10-09 NOTE — Progress Notes (Addendum)
[name] ([id number]) [id number] Page 1 of 22 Visit Report for 10/09/2023 Chief Complaint Document Details Patient Name: Date of Service: [name], [name] 10/09/2023 9:00 [name]: [id number] Patient Account Number: [id number] Date of Birth/Sex: Treating RN: 01/01/1976 (47 y.o. [name] Primary Care Provider: [name] Other Clinician: [name] Referring Provider: Treating Provider/Extender: [name] Weeks in Treatment: 4 Information Obtained from: Patient Chief Complaint Left foot ulcer Electronic Signature(s) Signed: 10/09/2023 9:11:03 AM By: [name] PA-C Entered By: [name] on 10/09/2023 09:11:03 -------------------------------------------------------------------------------- Debridement Details Patient Name: Date of Service: Heather Jefferson [name]. 10/09/2023 9:00 [name]: [id number] Patient Account Number: [id number] Date of Birth/Sex: Treating RN: 16-Dec-1975 (47 y.o. [name] Primary Care Provider: [name] Other Clinician: [name] Referring Provider: Treating Provider/Extender: [name] in Treatment: 81 Debridement Performed for Assessment: Wound #12R Left,Medial,Plantar Foot Performed By: Physician [name], PA-C Debridement Type: Debridement Severity of Tissue Pre Debridement: Fat layer exposed Level of Consciousness (Pre-procedure): Awake and Alert Pre-procedure Verification/Time Out Yes - 09:43 Taken: Start Time: 09:43 Percent of Wound Bed Debrided: 100% T Area Debrided (cm): otal 4.24 Tissue and other material debrided: Viable, Non-Viable, Callus, Slough, Subcutaneous, Slough Level: Skin/Subcutaneous Tissue Debridement Description: Excisional Instrument: Curette Bleeding: Minimum Hemostasis Achieved: Pressure Response to Treatment: Procedure was tolerated well Level of Consciousness (Post- Awake and  Alert procedure): BLAKELEY, AKRAM [name] ([id number]) [phone].pdf Page 2 of 22 Post Debridement Measurements of Total Wound Length: (cm) 2.7 Width: (cm) 2 Depth: (cm) 0.2 Volume: (cm) 0.848 Character of Wound/Ulcer Post Debridement: Stable Severity of Tissue Post Debridement: Fat layer exposed Post Procedure Diagnosis Same as Pre-procedure Electronic Signature(s) Signed: 10/09/2023 2:04:51 PM By: [name] Signed: 10/09/2023 3:34:19 PM By: Yevonne Pax RN Signed: 10/09/2023 4:20:01 PM By: [name] PA-C Entered By: [name] on 10/09/2023 09:51:27 -------------------------------------------------------------------------------- HPI Details Patient Name: Date of Service: [name], [name] 10/09/2023 9:00 [name]: [id number] Patient Account Number: [id number] Date of Birth/Sex: Treating RN: 19-Oct-1976 (47 y.o. [name] Primary Care Provider: [name] Other Clinician: [name] Referring Provider: Treating Provider/Extender: [name] in Treatment: 50 History of Present Illness HPI Description: 01/18/18-She is here for initial evaluation of the left great toe ulcer. She is [name] poor historian in regards to timeframe in detail. She states approximately 4 weeks ago she lacerated her toe on something in the house. She followed up with her primary care who placed her on Bactrim and ultimately [name] second dose of Bactrim prior to coming to wound clinic. She states she has been treating the toe with peroxide, Betadine and [name] Band-Aid. She did not check her blood sugar this morning but checked it yesterday morning it was 327; she is unaware of [name] recent A1c and there are no current records. She saw Dr. she would've orthopedics last week for an old injury to the left ankle, she states he did not see her toe, nor did she bring it to his attention. She smokes approximately 1 pack cigarettes [name] day. Her social  situation is concerning, she arrives this morning with her mother who appears extremely intoxicated/under the influence; her mother was asked to leave the room and be monitored by the patient's grandmother. The patient's aunt then accompanied the patient and the room throughout the rest of the appointment. We had [name] lengthy discussion regarding the deleterious effects of uncontrolled hyperglycemia and smoking as it  relates to wound healing and overall health. She was strongly encouraged to decrease her smoking and get her diabetes under better control. She states she is currently on [name] diet and has cut down her [facility] consumption. The left toe is erythematous, macerated and slightly edematous with malodor present. The edema in her left foot is below her baseline, there is no erythema streaking. We will treat her with Santyl, doxycycline; we have ordered and xray, culture and provided [name] Peg assist surgical shoe and cultured the wound. 01/25/18-She is here in follow-up evaluation for [name] left great toe ulcer and presents with an abscess to her suprapubic area. She states her blood sugars remain elevated, feeling "sick" and if levels are below 250, but she is trying. She has made no attempt to decrease her smoking stating that we "can't take away her food in her cigarettes". She has been compliant with offloading using the PEG assist you. She is using Santyl daily. the culture obtained last week grew staph aureus and Enterococcus faecalis; continues on the doxycycline and Augmentin was added on Monday. The suprapubic area has erythema, no femoral variation, purple discoloration, minimal induration, was accessed with [name] cotton tip applicator with sanguinopurulent drainage, this was cultured, I suspect the current antibiotic treatment will cover and we will not add anything to her current treatment plan. She was advised to go to urgent care or ER with any change in redness, induration or fever. 02/01/18-She  is here in follow-up evaluation for left great toe ulcers and [name] new abdominal abscess from last week. She was able to use packing until earlier this week, where she "forgot it was there". She states she was feeling ill with GI symptoms last week and was not taking her antibiotic. She states her glucose levels have been predominantly less than 200, with occasional levels between 200-250. She thinks this was contributing to her GI symptoms as they have resolved without intervention. There continues to be significant laceration to left toe, otherwise it clinically looks stable/improved. There is now less superficial opening to the lateral aspect of the great toe that was residual blister. We will transition to [facility] to all wounds, she will continue her Augmentin. If there is no change or deterioration next week for reculture. 02/08/18-She is here in follow-up evaluation for left great toe ulcer and abdominal ulcer. There is an improvement in both wounds. She has been wrapping her left toe with coban, not by our direction, which has created an area of discoloration to the medial aspect; she has been advised to NOT use coban secondary to her neuropathy. She states her glucose levels have been high over this last week ranging from 200-350, she continues to smoke. She admits to being less compliant with her offloading shoe. We will continue with same treatment plan and she will follow-up next week. 02/15/18-She is here in follow-up evaluation for left great toe ulcer and abdominal ulcer. The abdominal ulcer is epithelialized. The left great toe ulcer is improved and all injury from last week using the Coban wrap is resolved, the lateral ulcer is healed. She admits to noncompliance with wearing offloading shoe and admits to glucose levels being greater than 300 most of the week. She continues to smoke and expresses no desire to quit. There is one area medially that probes deeper than it has historically,  erythema to the toe and dorsal foot has consistently waxed and waned. There is no overt signs of cellulitis or infection but we will culture  the wound for any occult infection given the new area of depth and erythema. We will hold off on sensitivities for initiation of antibiotic therapy. 02/22/18-She is here in follow up evaluation for left great toe ulcer. There is overall significant improvement in both wound appearance, erythema and edema with changes made last week. She was not initiated on antibiotic therapy. Culture obtained last week showed oxacillin sensitive staph aureus, sensitive to DERRIONNA, FANFAN [name] ([id number]) [phone].pdf Page 3 of 22 clindamycin. Clindamycin has been called into the pharmacy but she has been instructed to hold off on initiation secondary to overall clinical improvement and her history of antibiotic intolerance. She has been instructed to contact the clinic with any noted changes/deterioration and the wound, erythema, edema and/or pain. She will follow-up next week. She continues to smoke and her glucose levels remain elevated >250; she admits to compliance with offloading shoe 03/01/18 on evaluation today patient appears to be doing fairly well in regard to her left first toe ulcer. She has been tolerating the dressing changes with the [facility] Dressing without complication and overall this has definitely showed signs of improvement according to records as well is what the patient tells me today. I'm very pleased in that regard. She is having no pain today 03/08/18 She is here for follow up evaluation of [name] left great toe ulcer. She remains non-compliant with glucose control and smoking cessation; glucose levels consistently >200. She states that she got new shoe inserts/peg assist. She admits to compliance with offloading. Since my last evaluation there is significant improvement. We will switch to prisma at this time and she will follow up  next week. She is noted to be tachycardic at this appointment, heart rate 120s; she has [name] history of heart rate 70-130 according to our records. She admits to extreme agitation r/t personal issues; she was advised to monitor her heartrate and contact her physician if it does not return to [name] more normal range (<100). She takes cardizem twice daily. 03/15/18-She is here in follow-up evaluation for left great toe ulcer. She remains noncompliant with glucose control and smoking cessation. She admits to compliance with wearing offloading shoe. The ulcer is improved/stable and we will continue with the same treatment plan and she will follow-up next week 03/22/18-She is here for evaluation for left great toe ulcer. There continues to be significant improvement despite recurrent hyperglycemia (over 500 yesterday) and she continues to smoke. She has been compliant with offloading and we will continue with same treatment plan and she will follow-up next week. 03/29/18-She is here for evaluation for left great toe ulcer. Despite continuing to smoke and uncontrolled diabetes she continues to improve. She is compliant with offloading shoe. We will continue with the same treatment plan and she will follow-up next week 04/05/18- She is here in follow up evaluation for [name] left great toe ulcer; she presents with small pustule to left fifth toe (resembles ant bite). She admits to compliance with wearing offloading shoe; continues to smoke or have uncontrolled blood glucose control. There is more callus than usual with evidence of bleeding; she denies known trauma. 04/12/18-She is here for evaluation of left great toe ulcer. Despite noncompliance with glycemic control and smoking she continues to make improvement. She continues to wear offloading shoe. The pustule, that was identified last week, to the left fifth toe is resolved. She will follow-up in 2 weeks 05/03/18-she is seen in follow-up evaluation for [name] left great toe  ulcer. She is compliant with  offloading, otherwise noncompliant with glycemic control and smoking. She has plateaued and there is minimal improvement noted. We will transition to [facility], replaced the insert to her surgical shoe and she will follow-up in one week 05/10/18- She is here in follow up evaluation for [name] left great toe ulcer. It appears stable despite measurement change. We will continue with same treatment plan and follow up next week. 05/24/18-She is seen in follow-up evaluation for [name] left great toe ulcer. She remains compliant with offloading, has made significant improvement in her diet, decreasing the amount of sugar/soda. She said her recent A1c was 10.9 which is lower than. She did see [name] diabetic nutritionist/educator yesterday. She continues to smoke. We will continue with the same treatment plan and she'll follow-up next week. 05/31/18- She is seen in follow-up evaluation for left great toe ulcer. She continues to remain compliant with offloading, continues to make improvement in her diet, increasing her water and decreasing the amount of sugar/soda. She does continue to smoke with no desire to quit. We will apply Prisma to the depth and Hydrofera Blue over. We have not received insurance authorization for oasis. She will follow up next week. 06/07/18-She is seen in follow-up evaluation for left great toe ulcer. It has stalled according to today's measurements although base appears stable. She says she saw [name] diabetic educator yesterday; her average blood sugars are less than 300 which is an improvement for her. She continues to smoke and states "that's my next step" She continues with water over soda. We will order for xray, culture and reinstate ace wrap compression prior to placing apligraf for next week. She is voicing no complaints or concerns. Her dressing will change to iodoflex over the next week in preparation for apligraf. 06/14/18-She is seen in follow-up evaluation for  left great toe ulcer. Plain film x-ray performed last week was negative for osteomyelitis. Wound culture obtained last week grew strep B and OSSA; she is initiated on keflex and cefdinir today; there is erythema to the toe which could be from ace wrap compression, she has [name] history of wrapping too tight and has has been encouraged to maintain ace wraps that we place today. We will hold off on application of apligraf today, will apply next week after antibiotic therapy has been initiated. She admits today that she has resumed taking [name] shower with her foot/toe submerged in water, she has been reminded to keep foot/toe out of the bath water. She will be seen in follow up next week 06/21/18-she is seen in follow-up evaluation for left great toe ulcer. She is tolerating antibiotic therapy with no GI disturbance. The wound is stable. Apligraf was applied today. She has been decreasing her smoking, only had 4 cigarettes yesterday and 1 today. She continues being more compliant in diabetic diet. She will follow-up next week for evaluation of site, if stable will remove at 2 weeks. 06/28/18- She is here in follow up evalution. Apligraf was placed last week, she states the dressing fell off on Tuesday and she was dressing with hydrofera blue. She is healed and will be discharged from the clinic today. She has been instructed to continue with smoking cessation, continue monitoring glucose levels, offloading for an additional 4 weeks and continue with hydrofera blue for additional two weeks for any possible microscopic opening. Readmission: 08/07/18 on evaluation today patient presents for reevaluation concerning the ulcer of her right great toe. She was previously discharged on 06/28/18 healed. Nonetheless she states that this began  to show signs of drainage she subsequently went to her primary care provider. Subsequently an x-ray was performed on 08/01/18 which was negative. The patient was also placed on antibiotics  at that time. Fortunately they should have been effective for the infection. Nonetheless she's been experiencing some improvement but still has [name] lot of drainage coming from the wound itself. 08/14/18 on evaluation today patient's wound actually does show signs of improvement in regard to the erythema at this point. She has completed the antibiotics. With that being said we did discuss the possibility of placing her in [name] total contact cast as of today although I think that I may want to give this just [name] little bit more time to ensure nothing recurrence as far as her infection is concerned. I do not want to put in the cast and risk infection at that time if things are not completely resolved. With that being said she is gonna require some debridement today. 08/21/18 on evaluation today patient actually appears to be doing okay in regard to her toe ulcer. She's been tolerating the dressing changes without complication. With that being said it does appear that she is ready and in fact I think it's appropriate for [place] to go ahead and initiate the total contact cast today. Nonetheless she will require some sharp debridement to prepare the wound for application. Overall I feel like things have been progressing well but we do need to do something to get this to close more readily. 08/24/18 patient seen today for reevaluation after having had the total contact cast applied on Tuesday. She seems to have done very well the wound appears to be doing great and overall I'm pleased with the progress that she's made. There were no abnormal areas of rubbing from the cast on her lower extremity. 08/30/18 on evaluation today patient actually appears to be completely healed in regard to her plantar toe ulcer. She tells me at this point she's been having [name] lot of issues with the cast. She almost fell [name] couple of times the state shall the step of her dog [name] couple times as well. This is been [name] very frustrating process for her  other nonetheless she has completely healed the wound which is excellent news. Overall there does not appear to be the evidence of infection at this time which is great news. 09/11/18 evaluation today patient presents for follow-up concerning her great toe ulcer on the left which has unfortunately reopened since I last saw her which was only [name] couple of weeks ago. Unfortunately she was not able to get in to get the shoe and potentially the AFO that's gonna be necessary due to her left foot drop. She continues with offloading shoe but this is not enough to prevent her from reopening it appears. When we last had her in the total contact cast she did well from [name] healing standpoint but unfortunately the wound reopened as soon as she came out of the cast within just [name] couple of weeks. 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 This is also part of the reason why she falls on [name] regular basis. I just do not believe that is gonna be safe for [place] to reinitiate the total contact cast as last time we had this  on she fell 3 times one week which is definitely not normal for her. 09/18/18 upon evaluation today the patient actually appears to be doing about the same in regard to her toe ulcer. She did not contact [facility] as I asked her to even though I had given her the prescription. In fact she actually states that she has no idea where the prescription is. She did apparently call [employer] and they told her that all she needed to do was bring the prescription in order to be able to be seen and work on getting the AFO for her left foot. With all that being said she still does not have an appointment and I'm not sure were things stand that regard. I will give her [name] new prescription today in order to contact them to get this  set up. 09/25/18 on evaluation today patient actually appears to be doing about the same in regard to her toes ulcer. She does have [name] small areas which seems to have [name] lot of callous buildup around the edge of the wound which is going to need sharp debridement today. She still is waiting to be scheduled for evaluation with SOPHYA, MORAL [name] ([id number]) [id number] Page 4 of 22 [employer] for possibility of an AFO. She states there supposed to call her tomorrow to get this set up. Unfortunately it does appear that her foot specifically the toe area is showing signs of erythema. There does not appear to be any systemic infection which is in these good news. 10/02/18 on evaluation today patient actually appears to be doing about the same in regard to her toe ulcer. This really has not done too well although it's not significantly larger it's also not significantly smaller. She has been tolerating the dressing changes without complication. She actually has her appointment with [employer] and [facility] tomorrow to hopefully be measured for obtaining and AFO splint. I think this would be helpful preventing this from reoccurring. We had contemplated starting the cast this week although to be honest I am reluctant to do that as she's been having nausea, vomiting, and seizure activity over the past three days. She has [name] history of seizures and have been told is nothing that can be done for these. With that being said I do believe that along with the seizures have the nausea vomiting which upon further questioning doesn't seem to be the normal for her and makes me concerned for the possibility of infection or something else going on. I discussed this with the patient and her mother during the office visit today. I do not feel the wound is effective but maybe something else. The responses this was "this just happens to her at times and we don't know why". They did not seem to be interested in  going to the hospital to have this checked out further. 10/09/18 on evaluation today patient presents for follow-up concerning her ongoing toe ulcer. She has been tolerating the dressing changes without complication. Fortunately there does not appear to be any evidence of infection which is great news however I do think that the patient would benefit from going ahead for with the total contact cast. She's actually in [name] wheelchair today she tells me that she will use her walker if we initiate the cast. I was very specific about the fact that if we were gonna do the cast I wanted to make sure that she was using the walker in order to prevent any falls. She tells me she does not  have stairs that she has to traverse on [name] regular basis at her home. She has not had any seizures since last week again that something that happens to her often she tells me she did talk to [employer] and they said that it may take up to three weeks to get the brace approved for her. Hopefully that will not take that long but nonetheless in the meantime I do think the cast could be of benefit. 10/12/18 on evaluation today patient appears to be doing rather well in regard to her toe ulcer. It's just been [name] few days and already this is significantly improved both as far as overall appearance and size. Fortunately there's no sign of infection. She is here for her first obligatory cast change. 10/19/18 Seen today for follow up and management of left great toe ulcer. Wound continues to show improvement. Noted small open area with seroussang drainage with palpation. Denies any increased pain or recent fevers during visit. She will continue calcium alginate with offloading shoe. Denies any questions or concerns during visit. 10/26/18 on evaluation today patient appears to be doing about the same as when I last saw her in regard to her wound bed. Fortunately there does not appear to be any signs of infection. Unfortunately she continues to  have [name] breakdown in regard to the toe region any time that she is not in the cast. It takes almost no time at all for this to happen. Nonetheless she still has not heard anything from the brace being made by [employer] as to when exactly this will be available to her. Fortunately there is no signs of infection at this time. 10/30/18 on evaluation today patient presents for application of the total contact cast as we just received him this morning. Fortunately we are gonna be able to apply this to her today which is great news. She continues to have no significant pain which is good news. Overall I do feel like things have been improving while she was the cast is when she doesn't have [name] cast that things get worse. She still has not really heard anything from [employer] regarding her brace. 11/02/18 upon evaluation today patient's wound already appears to be doing significantly better which is good news. Fortunately there does not appear to be any signs of infection also good news. Overall I do think the total contact cast as before is helping to heal this area unfortunately it's just not gonna likely keep the area closed and healed without her getting her brace at least. Again the foot drop is [name] significant issue for her. 11/09/18 on evaluation today patient appears to be doing excellent in regard to her toe ulcer which in fact is completely healed. Fortunately we finally got the situation squared away with the paperwork which was needed to proceed with getting her brace approved by Medicaid. I have filled that out unfortunately that information has been sent to the orthopedic office that I worked at 2 1/2 years ago and not tired Current wound care measures. Fortunately she seems to be doing very well at this time. 11/23/18 on evaluation today patient appears to be doing More Poorly Compared to Last Time I Saw Her. At [facility] She Had Completely Healed. Currently she is continuing to have issues with reopening.  She states that she just found out that the brace was approved through Medicaid now she just has to go get measured in order to have this fitted for her and then made. Subsequently she  does not have an appointment for this yet that is going to complicate things we obviously cannot put her back in the cast if we do not have everything measured because they're not gonna be able to measure her foot while she is in the cast. 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 Obviously this is unfortunate and does have me somewhat worried as well. 11/30/18 on evaluation today patient's toe ulcer actually appears to be doing fairly well. The good news is she will be getting her brace in the shoes next week on Wednesday. Hopefully we will be able to get this to heal without having to go back in the cast however she may need the cast in order to get the wound completely heal and then go from there. Fortunately there's no signs of infection at this time. 12/07/18 on evaluation today patient fortunately did receive her brace and she states she could tell this definitely makes her walk better. With that being said she's been having issues with her toe where she noticed yesterday there was [name] lot of tissue that was loosing off this appears to be much larger than what it was previous. She also states that her leg has been read putting much across the top of her foot just about the ankle although this seems to be receiving somewhat. The total area is still red and appears to be someone infected as best I can tell. She is previously taken Bactrim and that may be [name] good option for her today as well. We are gonna see what I wound culture shows as well and I think that this is definitely appropriate. With that being said outside of the culture I still need to initiate something in the interim and that's what I'm gonna go ahead and  select Bactrim is [name] good option for her. 12/14/18 on evaluation today patient appears to be doing better in regard to her left great toe ulcer as compared to last week's evaluation. There's still some erythema although this is significantly improved which is excellent news. Overall I do believe that she is making good progress is still gonna take some time before she is where I would like her to be from the standpoint of being able to place her back into the total contact cast. Hopefully we will be where we need to be by next week. 12/21/18 on evaluation today patient actually appears to be doing poorly in regard to her toe ulcer. She's been tolerating the dressing changes without complication. Fortunately there's no signs of systemic infection although she does have [name] lot of drainage from the toe ulcer and this does seem to be causing some issues at this point. She does have erythema on the distal portion of her toe that appears to be likely cellulitis. 12/28/18 on evaluation today patient actually appears to be doing [name] little better in my pinion in regard to her toe ulcer. With that being said she still does have some evidence of infection at this time and for her culture she had both E. coli as well as enterococcus as organisms noted on evaluation. For that reason I think that though the Keflex likely has treated the E. coli rather well this has really done nothing for the enterococcus. We are going to have to initiate treatment for this specifically. 01/04/19 on evaluation today patient's toe actually appears to be doing  better from the standpoint of infection. She currently would like to see about putting the cash back on I think that this is appropriate as long as she takes care of it and keeps it from getting wet. She is gonna have some drainage we can definitely pass this up with Drawtex and alginate to try to prevent as much drainage as possible from causing the problems. With that being said I do  want to at least try her with the cast between now and Tuesday. If there any issues we can't continue to use it then I will discontinue the use of the cast at that point. 01/08/19 on evaluation today patient actually appears to be doing very well as far as her foot ulcer specifically the great toe on the left is concerned. She did have an area of rubbing on the medial aspect of her left ankle which again is from the cast. Fortunately there's no signs of infection at this point in this appears to be [name] very slight skin breakdown. The patient tells me she felt it rubbing but didn't think it was that bad. Fortunately there is no signs of active infection at this time which is good news. No fevers, chills, nausea, or vomiting noted at this time. 01/15/19 on evaluation today patient actually appears to be doing well in regard to her toe ulcer. Again as previous she seems to do well and she has the cast on which indicates to me that during the time she doesn't have [name] cast on she's putting way too much pressure on this region. Obviously I think that's gonna be an issue as with the current national emergency concerning the Covid-19 Virus it has been recommended that we discontinue the use of total contact casting by the chief medical officer of our company, Dr. Maurine Minister. The reasoning is that if [name] patient becomes sick and cannot come into have the cast removed they could [place], Shirah [name] ([id number]) [id number].pdf Page 5 of 22 not just leave this on for an additional two weeks. Obviously the hospitals also do not want to receive patient's who are sick into the emergency department to potentially contaminate the region and spread the Covid-19 Virus among other sick individuals within the hospital system. Therefore at this point we are suspending the use of total contact cast until the current emergency subsides. This was all discussed with the patient today as well. 01/22/19 on evaluation  today patient's wound on her left great toe appears to be doing slightly worse than previously noted last week. She tells me that she has been on this quite [name] bit in fact she tells me she's been awake for 38 straight hours. This is due to the fact that she's having to care for grandparents because nobody else will. She has been taking care of them for five the last seven days since I've seen her they both have dementia his is from [name] stroke and her grandmother's was progressive. Nonetheless she states even her mom who knows her condition and situation has only help two of those days to take care of them she's been taking care of the rest. Fortunately there does not appear to be any signs of active infection in regard to her toe at this point although obviously it doesn't look as good as it did previous. I think this is directly related to her not taking off the pressure and friction by way of taking things easy. Though I completely understand what's going on. 01/29/19 on evaluation  today patient's tools are actually appears to be showing some signs of improvement today compared to last week's evaluation as far as not necessarily the overall size of the wound but the fact that she has some new skin growth in between the two ends of the wound opening. Overall I feel like she has done well she states that she had [name] family member give her what sounds to be [name] CAM walker boot which has been helpful as well. 02/05/19 on evaluation today patient's wound bed actually appears to be doing significantly better in regard to her overall appearance of the size of the wound. With that being said she is still having an issue with offloading efficiently enough to get this to close. Apparently there is some signs of infection at this point as well unfortunately. Previously she's done well of Augmentin I really do not see anything that needs to be culture currently but there theme and cellulitis of the foot that I'm seeing I'm  gonna go ahead and place her on an antibiotic today to try to help clear this up. 02/12/2019 on evaluation today patient actually appears to be doing poorly in regard to her overall wound status. She tells me she has been using her offloading shoe but actually comes in today wearing her tennis shoe with the AFO brace. Again as I previously discussed with her this is really not sufficient to allow the area to heal appropriately. Nonetheless she continues to be somewhat noncompliant and I do wonder based on what she has told my nurse in the past as to whether or not [name] good portion of this noncompliance may be recreational drug and alcohol related. She has had [name] history of heroin overdose and this was fairly recently in the past couple of months that have been seeing her. Nonetheless overall I feel like her wound looks significantly worse today compared to what it was previous. She still has significant erythema despite the Augmentin I am not sure that this is an appropriate medication for her infection I am also concerned that the infection may have gone down into her bone. 02/19/19 on evaluation today patient actually appears to be doing about the same in regard to her toe ulcer. Unfortunately she continues to show signs of bone exposure and infection at this point. There does not appear to be any evidence of worsening of the infection but I'm also not really sure that it's getting significantly better. She is on the Augmentin which should be sufficient for the Staphylococcus aureus infection that she has at this point. With that being said she may need IV antibiotics to more appropriately treat this. We did have [name] discussion today about hyperbaric option therapy. 02/28/19 on evaluation today patient actually appears to be doing much worse in regard to the wound on her left great toe as compared to even my previous evaluation last week. Unfortunately this seems to be training in [name] pretty poor direction. Her  toe was actually now starting to angle laterally and I can actually see the entire joint area of the proximal portion of the digit where is the distal portion of the digit again is no longer even in contact with the joint line. Unfortunately there's [name] lot more necrotic tissue around the edge and the toe appears to be showing signs of becoming gangrenous in my pinion. I'm very concerned about were things stand at this point. She did see infectious disease and they are planning to send in [name] prescription for  Sivextro for her and apparently this has been approved. With that being said I don't think she should avoid taking this but at the same time I'm not sure that it's gonna be sufficient to save her toe at this point. She tells me that she still having to care for grandparents which I think is putting quite [name] bit of strain on her foot and specifically the total area and has caused this to break down even to [name] greater degree than would've otherwise been expected. 03/05/19 on evaluation today patient actually appears to be doing quite well in regard to her toe all things considering. She still has bone exposed but there appears to be much less your thing on overall the appearance of the wound and the toe itself is dramatically improved. She still does have some issues currently obviously with infection she did see vascular as well and there concerned that her blood flow to the toad. For that reason they are setting up for an angiogram next week. 03/14/19 on evaluation today patient appears to be doing very poor in regard to her toe and specifically in regard to the ulceration and the fact that she's starting to notice the toe was leaning even more towards the lateral aspect and the complete joint is visible on the proximal aspect of the joint. Nonetheless she's also noted [name] significant odor and the tip of the toe is turning more dark and necrotic appearing. Overall I think she is getting worse not better as  far as this is concerned. For that reason I am recommending at this point that she likely needs to be seen for likely amputation. READMISSION 03/19/2021 This is [name] patient that we cared for in this clinic for [name] prolonged period of time in 2019 and 2020 with [name] left foot and left first toe wound. 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 Excell Seltzer. In December 2021 she had an ulcer on her right great toe as well as the fourth and fifth toes. 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 She also had an angiogram at that time and underwent angioplasty of the right anterior tibial artery. In any case she claims that the wound on the right foot is closed I did not look at this today which was probably an oversight although I think that should be done next week. 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 According to Dr. Bernette Redbird last review that she was out of the area being cared for by another physician but recently came back to his attention. 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 [name] culture of this area showed E. coli apparently before she came back to see Dr. Excell Seltzer she was supposed to be receiving antibiotics but she did not really take them. Nor is she offloading this area at all. Finally her last hemoglobin A1c listed in epic was in March 2022 at 14.1 she says things are [name] lot better since then although I am not sure. 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 She had this wound at the time. During this admission she had E. coli sepsis [name] TEE was negative. Past medical history is extensive and includes type 2 diabetes with peripheral neuropathy cardiomyopathy with an ejection  fraction of 33%,  hypertension, hyperlipidemia chronic renal failure stage III history of substance abuse with cocaine although she claims to be clean now verified by her mother. She is still [name] heavy cigarette smoker. She has [name] history of bipolar disorder seizure disorder ABI in our clinic was 1.05 6/1; left midfoot in the setting of [name] TMA done previously. Round circular wound with [name] "knuckle" of protruding tissue. The problem is that the knuckle was not attached to any of the surrounding granulation and this probed proximally widely I removed [name] large portion of this tissue. This wound goes with considerable undermining laterally. I do not feel any bone there was no purulence but this is [name] deep wound. 6/8; in spite of the debridement I did last week. She arrives with [name] wound looking exactly the same. [name] protruding "knuckle" of tissue nonadherent to most of the surrounding tissue. There is considerable depth around this from 6-12 o'clock at 2.7 cm and undermining of 1 cm. This does not look overtly infected and the x- ray I did last week was negative for any osseous abnormalities. We have been using silver collagen 6/15; deep tissue culture I did last week showed moderate staph aureus and moderate Pseudomonas. This will definitely require prolonged antibiotic therapy. The pathology on the protuberant area was negative for malignancy fungus etc. 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 We have been using silver collagen. I am going to be prescribing Levaquin for 2 weeks. Her CT scan of the foot is down for 7/5 6/22; CT scan of the foot on 7 5. She says she has hardware in the left leg from her previous fracture. She is on the Levaquin for the deep tissue culture I did that showed methicillin sensitive staph aureus and Pseudomonas. I gave her [name] 2-week supply and she will have another week. She arrives in clinic today with the same protuberant tissue however this  is nonadherent to the tissue surrounding it. I am really at [name] loss to explain this unless there is underlying deep tissue infection 6/29; patient presents for 1 week follow-up. She has been using collagen to the wound bed. She reports taking her antibiotics as prescribed.She has no complaints or issues today. She denies signs of infection. 7/6; patient presents for one week followup. She has been using collagen to the wound bed. She states she is taking Levaquin however at times she is not able VIDIA, PEPPIN [name] ([id number]) [id number] Page 6 of 22 to keep it down. She denies signs of infection. 7/13; patient presents for 1 week follow-up. She has been using silver alginate to the wound bed. She still has nausea when taking her antibiotics. She denies signs of infection. 7/20; patient presents for 1 week follow-up. She has been using silver alginate with gentamicin cream to the wound bed. She denies any issues and has no complaints today. She denies signs of infection. 7/27; patient presents for 1 week follow-up. She continues to use silver alginate with gentamicin cream to the wound bed. She reports starting her antibiotics. She has no issues or complaints. Overall she reports stability to the wound. 8/3; patient presents for 1 week follow-up. She has been using silver alginate with gentamicin cream to the wound bed. She reports completing all antibiotics. She has no issues or complaints today. She denies signs of infection. 8/17; patient presents for 2-week follow-up. He is to use silver alginate to the wound bed. She has no issues or  complaints today. She denies signs of infection. She reports her pain has improved in her foot since last clinic visit 8/24; patient presents for 1 week follow-up. She continues to use silver alginate to the wound bed. She has no issues or complaints. She denies signs of infection. Pain is stable. 9/7; patient presents for follow-up. She  missed her last week appointment due to feeling ill. She continues to use silver alginate. She has [name] new wound to the right lower extremity that is covered in eschar. She states It occurred over the past week and has no idea how it started. She currently denies signs of infection. 9/14; patient presents for follow-up. T the left foot wound she has been using gentamicin cream and silver alginate. T the right lower extremity wound she has o o been keeping this covered and has not obtain Santyl. 9/21; patient presents for follow-up. She reports using gentamicin cream and silver alginate to the left foot and Santyl to the right lower extremity wound. She has no issues or complaints today. She denies signs of infection. 9/28; patient presents for follow-up. She reports [name] new wound to her right heel. She states this occurred [name] few days ago and is progressively gotten worse. She has been trying to clean the area with [name] Q-tip and Santyl. She reports stability in the other 2 wounds. She has been using gentamicin cream and silver alginate to the left foot and Santyl to the right lower extremity wound. 10/12; patient presents for follow-up. She reports improvement to the wound beds. She is seeing vein and vascular to discuss the potential of [name] left BKA. She states they are going to do an arteriogram. She continues to use silver alginate with dressing changes to her wounds. 11/2; patient presents for follow-up. She states she has not been doing dressing changes to the wound beds. She states she is not able to offload the areas. She reports chronic pain to her left foot wound. 11/9; patient presents for follow-up. She came in with only socks on. She states she forgot to put on shoes. It is unclear if she is doing any dressing changes. She currently denies systemic signs of infection. 11/16; patient presents for follow-up. She came again only with socks on. She states she does not wear shoes ever. It is unclear  if she does dressing changes. She currently denies systemic signs of infection. 11/23; patient presents for follow-up. She wore her shoes today. It still unclear exactly what dressing she is using for each wound but she did states she obtained Dakin's solution and has been using this to the left foot wound. She currently denies signs of infection. 11/30; patient presents for follow-up. She has no issues or complaints today. She currently denies signs of infection. 12/7; patient presents for follow-up. She has no issues or complaints today. She has been using Hydrofera Blue to the right heel wound and Dakin solution to the left foot wound. Her right anterior leg wound is healed. She currently denies signs of infection. 12/14; patient presents for follow-up. She has been using Hydrofera Blue to the right heel and Dakin's to the left foot wounds. She has no issues or complaints today. She denies signs of infection. 12/21; patient presents for follow-up. She reports using Hydrofera Blue to the right heel and Dakin's to the left foot wound. She denies signs of infection. 12/28; patient presents for follow-up. She continues to use Dakin's to the left foot wound and Hydrofera Blue to the right heel  wound. She denies signs of infection. 1/4; patient presents for follow-up. She has no issues or complaints today. She denies signs of infection. 1/11; patient presents for follow-up. It is unclear if she has been dressing these wounds over the past week. She currently denies signs of infection. 1/18; patient presents for follow-up. She states she has been using Dakin's wet-to-dry dressings to the left foot. She has been using Hydrofera Blue to the right foot foot wound. She states that the anterior right leg wound has reopened and draining serous fluid. She denies signs of infection. 1/25; patient presents for follow-up. She has no issues or complaints today. 2/1; patient presents for follow-up. She has no  issues or complaints today. She denies signs of infection. 2/8; patient presents for follow-up. She has lost her surgical shoes. She did not have [name] dressing to the right heel wound. She currently denies signs of infection. 2/15; patient presents for follow-up. She reports more pain to the right heel today. She denies purulent drainage Or fever/chills 2/22; patient presents for follow-up. She reports taking clindamycin over the past week. She states that she continues to have pain to her right heel. She reports purulent drainage. Readmission 03/16/2022 Ms. Yovonne Maleki is [name] 47 year old female with [name] past medical history of type 2 diabetes, osteomyelitis to her feet, chronic systolic heart failure and bipolar disorder that presents to the clinic for bilateral feet wounds and right lower extremity wound. She was last seen in our clinic on 12/15/2021. At that time she had purulent drainage coming out of her right plantar foot and I recommended she go to the ED. She states she went to [facility] and has been there for the past 3 months. I cannot see the records. She states she had OR debridement and was on several weeks of IV antibiotics while inpatient. Since discharge she has not been taking care of the wound beds. She had nothing on her feet other than socks today. She currently denies signs of infection. 5/31; patient presents for follow-up. She has been using Dakin's wet-to-dry dressings to the wound beds on her feet bilaterally and antibiotic ointment to the right anterior leg wound. She had [name] wound culture done at last clinic visit that showed moderate Pseudomonas aeruginosa sensitive to ciprofloxacin. She currently denies systemic signs of infection. 6/14; patient presents for follow-up. She received Keystone 5 days ago and has been using this on the wound beds. She states that last week she had to go to the hospital because she had increased warmth and erythema to the right foot. She  was started on 2 oral antibiotics. She states she has been taking these. She currently denies systemic signs of infection. She has no issues or complaints today. 6/21; patient presents for follow-up. She states she has been using Keystone antibiotics to the wound beds. She has no issues or complaints today. She denies signs of infection. 6/28; patient presents for follow-up. She has been using Keystone antibiotics to the wound beds. She has no issues or complaints today. 7/12; patient presents for follow-up. Has been using Keystone antibiotics to the wound beds with calcium alginate. She has no issues or complaints today. She ALEXINE, HALLAM [name] ([id number]) [id number] Page 7 of 22 never followed up with her orthopedic surgeon who did the OR debridement to the right foot. We discussed the total contact cast for the left foot and patient would like to do this next week. 7/19; patient presents for follow-up. She has been using [address]  antibiotics with calcium alginate to the wound beds. She has no issues or complaints today. Patient is in agreement to do the total contact cast of the left foot today. She knows to return later this week for the obligatory cast change. 05-13-2022 upon evaluation today patient's wound which she has the cast of the left leg actually appears to be doing significantly better. Fortunately I do not see any signs of active infection locally or systemically which is great news and overall I am extremely pleased with where we stand currently. 7/26; patient presents for follow-up. She has [name] cast in place for the past week. She states it irritated her shin. Other than that she tolerated the cast well. She states she would like [name] break for 1 week from the cast. We have been using Keystone antibiotic and Aquacel to both wound beds. She denies signs of infection. 8/2; patient presents for follow-up. She has been using Keystone and Aquacel to the wound beds.  She denies any issues and has no complaints. She is agreeable to have the cast placed today for the left leg. 06-03-2022 upon evaluation today patient appears to be doing well with regard to her wound she saw some signs of improvement which is great news. Fortunately I do not see any evidence of active infection locally or systemically at this time which is great news. No fevers, chills, nausea, vomiting, or diarrhea. 8/16; patient presents for follow-up. She has no issues or complaints today. We have been using Keystone and Aquacel to the wound beds. The left lower extremity is in [name] total contact cast. She is tolerated this well. 8/23; patient presents for follow-up. She has had the total contact cast on the left leg for the past week. Unfortunately this has rubbed and broken down the skin to the medial foot. She currently denies signs of infection. She has been using Keystone antibiotic to the right plantar foot wound. 8/30; patient presents for follow-up. We have held off on the total contact cast for the left leg for the past week. Her wound on the left foot has improved and the previous surrounding breakdown of skin has epithelialized. She has been using Keystone antibiotic to both wound beds. She has no issues or complaints today. She denies signs of infection. 9/6; patient presents for follow-up. She has ordered her's Keystone antibiotic and this is arriving this week. She has been doing Dakin's wet-to-dry dressings to the wound beds. She denies signs of infection. She is agreeable to the total contact cast today. 9/13; patient presents for follow-up. She states that the cast caused her left leg shin to be sore. She would like to take [name] break from the cast this week. She has been using Keystone antibiotic to the right plantar foot wound. She denies signs of infection. 9/20; patient presents for follow-up. She has been using Keystone antibiotics to the wound beds with calcium alginate to the  right foot wound and Hydrofera Blue to the left foot wound. She is agreeable to having the cast placed today. She has been approved for Apligraf and we will order this for next clinic visit. 9/27; patient presents for follow-up. We have been using Keystone antibiotic with Hydrofera Blue to the left foot wound under [name] total contact cast. T the right o foot wound she has been using Keystone antibiotic and calcium alginate. She declines [name] total contact cast today. Apligraf is available for placement and she would like to proceed with this. 07-28-2022 upon evaluation today patient  appears to be doing well currently in regard to her wound. She is actually showing signs of significant improvement which is great news. Fortunately I do not see any evidence of active infection locally nor systemically at this time. She has been seeing Dr. Mikey Bussing and to be honest has been doing very well with the cast. Subsequently she comes in today with [name] cast on and we did reapply that today as well. She did not really want to she try to talk me out of that but I explained that if she wanted to heal this is really the right way to go. Patient voiced understanding. In regard to her right foot this is actually [name] lot better compared to the last time I saw her which is also great news. 10/11; patient presents for follow-up. Apligraf and the total contact cast was placed to the left leg at last clinic visit. She states that her right foot wound had burning pain to it with the placement of Apligraf to this area. She has been doing Ruston over this area. She denies signs of infection including increased warmth, erythema or purulent drainage. 11/1; 3-week follow-up. The patient fortunately did not have [name] total contact cast or an Apligraf and on the left foot. She has been using Keystone ABD pads and kerlix and her own running shoes She arrives in clinic today with thick callus and [name] very poor surface on the left foot on the right  nonviable skin subcutaneous tissue and [name] deep probing hole. 11/15; patient missed her last clinic appointment. She states she has not been dressing the wound beds for the past 2 weeks. She states that at she had [name] new roommate but is now going back to live with her mother. Apparently its been [name] distracting 2 weeks. Patient currently denies signs of infection. 11/22; patient presents for follow-up. She states she has been using Keystone antibiotic and Dakin's wet-to-dry dressings to the wound beds. She is agreeable for cast placement today. We had ordered Apligraf however this has not been received by our facility. 11/29; Patient had [name] total contact cast placed at last clinic visit and she tolerated this well. We were using silver alginate under the cast. Patient's been using Keystone antibiotic with Aquacel to the right plantar foot wound. She has no issues or complaints today. Apligraf is available for placement today. Patient would like to proceed with this. 12/6; patient presents for follow-up. She had Apligraf placed in standard fashion last clinic visit under the total contact cast to the left lower extremity. She has been using Keystone antibiotic and Aquacel to the right plantar foot wound. She has no issues or complaints today. 12/13; patient presents for follow-up. She has finished 5 Apligraf placements. Was told she would not qualify for more. We have been doing [name] total contact cast to the left lower extremity. She has been using Keystone antibiotic and Aquacel to the right plantar foot wound. She has no issues or complaints today. 12/20; patient presents for follow-up. We have been using Hydrofera Blue with Keystone antibiotic under [name] total contact cast of the left lower extremity. She reports using Keystone antibiotic and silver alginate to the right heel wound. She has no issues or complaints today. 12/27; patient presents with [name] healthy wound on the left midfoot. We have Apligraf to apply  that to that more also using [name] total contact cast. On the right we are using Keystone and silver alginate. She is offloading the right heel with [name]  surgical shoealthough by her admission she is on her feet quite [name] bit 1/3; patient presents for follow-up. Apligraf was placed to the wound beds last clinic visit. She was placed in [name] total contact cast to the left lower extremity. She declines [name] total contact cast today. She states that her mother is in the hospital and she cannot adequately get around with the cast on. 1/10; patient presents for follow-up. She declined the total contact cast at last clinic visit. Both wounds have declined in appearance. She states that she has been on her feet and not offloading the wound beds. She currently denies signs of infection. 1/17; patient presents for follow-up. She had the total contact cast along with Apligraf placed last week to the left lower extremity. She tolerated this well. She has been using Aquacel Ag and Keystone antibiotic to the right heel wound. She currently denies signs of infection. She has no issues or complaints today. 1/24; patient presents for follow-up. We have been using Apligraf to the left foot wound along with [name] total contact cast. She has done well with this. T the right o heel wound she has been using Aquacel Ag and Keystone antibiotic ointment. She has no issues or complaints today. She denies signs of infection. JEHAN, GERRISH [name] ([id number]) [id number] Page 8 of 22 1/31; patient presents for follow-up. We have been using Apligraf to the left foot wound along with the total contact cast. She continues to do well with this. To the right heel we have been using Aquacel Ag and Keystone antibiotic ointment. She has no issues or complaints today. 12-01-2022 upon evaluation patient is seen today on my schedule due to the fact that she unfortunately was in the hospital yesterday. Her cast needed to come off the  only reason she is out of the hospital is due to the fact that they would not take it off at the hospital which is somewhat bewildered reading to me to be perfectly honest. I am not certain why this was but either way she was released and then was placed on my schedule today in order to get this off and reapply the total contact casting as appropriate. I do not have an Apligraf for her today it was applied last week and today's actually expired yesterday as there was some scheduling conflicts with her being in the hospital. Nonetheless we do not have that for reapplication today but the good news that she is not draining too much and the Apligraf can go for up to 2 weeks so I am going to go ahead and reapply the total contact casting but we are going to leave the Apligraf in place. 2/14; patient presents for follow-up. T the left leg she has had the total contact cast and Apligraf for the past week. She has had no issues with this. T the o o right heel she has been using antibiotic ointment and Aquacel Ag. 2/21; patient presents for follow-up. We have been using Apligraf and [name] total contact cast to the left lower extremity. She is tolerated this well. Unfortunately she is not approved for any more Apligraf per insurance. She has been using antibiotic ointment and Aquacel Ag to the right foot. She has no issues or complaints today. 2/28; patient presents for follow-up. We have been using Hydrofera Blue and antibiotic ointment under the total contact cast to the left lower extremity. He has been using Aquacel Ag with antibiotic ointment to the right plantar foot. She has no issues  or complaints today. 3/6; patient presents for follow-up. She did not obtain her gentamicin ointment. She has been using Aquacel Ag to the right plantar foot wound. We have been using Hydrofera Blue with antibiotic ointment under the total contact cast to the left lower extremity. She has no issues or complaints today. 3/12;  patient presents for follow-up. She has been using gentamicin ointment and Aquacel Ag to the right plantar foot wound. We have been using Hydrofera Blue with antibiotic ointment under the total contact cast on the left lower extremity foot wound. She has no issues or complaints today. 3/20; patient presents for follow-up. She has been using gentamicin ointment and Aquacel Ag to the right plantar foot wound. We have been using Hydrofera Blue with antibiotic ointment under the total contact cast to the left lower extremity wound. She has no issues or complaints today. 3/27; patient presents for follow-up. We have been using antibiotic ointment and Aquacel Ag to the right plantar foot wound. We have been using Hydrofera Blue with antibiotic ointment under the total contact cast to the left lower extremity. Both wounds are smaller. 01-24-2023 upon evaluation today patient actually appears to be doing better compared to last time I saw her. It has been several weeks since I last saw her but nonetheless I think the total contact casting is doing [name] good job. Fortunately I do not see any evidence of infection or worsening overall which is great news and in general I do believe that she is moving in the appropriate direction. It has been [name] very long road but nonetheless I feel like she is nearing the end at least in regard to the left foot and even the right foot looks much better to be perfectly honest. 01-31-2023 upon evaluation today patient appears to be doing well currently in regard to her wounds. She has been require some sharp debridement which I think is probably bone both sides to remove [name] lot of callus that has built up at this point. I discussed that with her today. Also think total contact cast is doing well for the left foot Emina continue that as well. 4/17; patient presents for follow-up. We have been doing Hydrofera Blue with antibiotic ointment under the total contact cast to the left lower  extremity and Aquacel Ag with antibiotic ointment to the right foot wound. She has been using her offloading heel shoe here. 4/24; patient presents for follow-up. We have been doing Hydrofera Blue with antibiotic ointment under the total contact cast to the left lower extremity and Aquacel Ag with antibiotic ointment to the right foot wound. She has been using her offloading heel shoe here. Wounds are smaller with slough accumulation. 5/1; patient presents for follow-up. We have been doing Hydrofera Blue with antibiotic ointment under the total contact cast to the left lower extremity and Aquacel Ag with antibiotic ointment to the right foot wound. Left foot wound is stable and right foot wound appears smaller. 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 On the left she is using Hydrofera Blue topical antibiotic under [name] total contact cast, on the right plantar heel gentamicin Aquacel Ag. According to our intake nurse both are making slow but steady improvements. 5/15; patient presents for follow-up. We have been doing Hydrofera Blue with antibiotic ointment under the total contact cast on the left and antibiotic ointment with Aquacel Ag on the right. Unfortunately the left foot wound has declined in size and appearance. We also do not have the total contact  cast available in office today. Patient denies signs of infection. 5/22; patient presents for follow-up. We have been doing Hydrofera Blue and antibiotic ointment to the left foot and Aquacel Ag with antibiotic ointment to the right foot. We took [name] break from the cast since we did not have it placed at last week. Wounds look stable if not slightly improved. 03-27-2023 patient presents today she has had this For Most 2 Weeks Because She Was in the Hospital and They Did Not T It off. Again That Is Really Not ake Something That She Could Control but Nonetheless We Are Seeing Her at This Point Today for Reevaluation. We Do Want to Go and See about  Reapply the Cast. Again We Had All Set Everything up to Go Ahead and Do This When We Went to Get the Cast Only to Realize That Not Had Come in and We Were out at the Moment. With That Being [place] Spaniel We Will Get Have T Get the Exam before We Can Reapply the T Contact Casting Going Forward. o otal 6/12; patient presents for follow-up. She has been using Hydrofera Blue to the left foot wound and Aquacel Ag to the right foot wound. No cast was available last week placed on the patient so she has been offloading with her surgical shoe. She has no issues or complaints today. 6/19; patient presents for follow-up. We have been using Hydrofera Blue to the left foot wound under the total contact cast and Aquacel Ag to the right wound. She has no issues or complaints today. The wounds are smaller. 6/26; patient presents for follow-up. We have been using Hydrofera Blue to the left foot wound under the total contact cast and Aquacel Ag to the right foot wound. Wounds are smaller. Patient has no issues or complaints today. 7/3; patient presents for follow-up. We have been using Hydrofera Blue to the left foot wound under the total contact cast and Aquacel Ag to the right foot wound. Wounds Appear smaller. No signs of infection. 7/10; patient presents for follow-up. We have been using Hydrofera Blue to the left foot wound under the total contact cast and Aquacel Ag to the right foot wound. Left foot wound is smaller. Right foot wound is stable. 7/17; patient presents for follow-up. We have been using Hydrofera Blue to the left foot under the total contact cast and Aquacel Ag to the right foot wound. She has no issues or complaints today. 7/24; patient presents for follow-up. We have been using Hydrofera Blue to the left foot under the total contact cast and Aquacel Ag to the right foot wound. Unfortunately she was bit by her dog over the weekend and developed [name] wound to the right lower leg. She has been keeping the  area covered. 7/31; patient presents for follow-up. We have been using Hydrofera Blue to the left foot under the total contact cast and Aquacel Ag to the right foot wound and mupirocin ointment to the right anterior leg wound. She started her oral antibiotics 2 days ago. 8/7; patient presents for follow-up. We have been using Hydrofera Blue and antibiotic ointment to the left foot under the total contact cast and Aquacel Ag to the right foot wound. Antibiotic ointment to the right anterior leg wound. She completed her oral antibiotics. She has no issues or complaints today. Wounds continue to improve albeit slowly. CHEN, MINSER [name] ([id number]) [id number] Page 9 of 22 8/14; 8/14; patient has 3 wounds 1 on the left plantar heel 1 on the  right plantar heel and [name] small wound on the right anterior lower leg. We have been using Hydrofera Blue in the left heel Aquacel on the right heel. The left heel has been in [name] total contact cast 8/21; patient presents for follow-up. We have been using Hydrofera Blue to the left foot wound under the total contact cast. T the right heel patient has been o using Aquacel Ag. T the anterior leg wound antibiotic ointment. All wounds have healed. o 8/28; patient presents for follow-up. She has been using her surgical shoe with offloading felt pads. Her wounds have remained closed. 9/11; patient presents for final follow-up to assure that her wounds have remained closed. Unfortunately the left foot wound has opened again. But she has been wearing her Surgical shoe for offloading with felt pad. 07-14-2023 upon evaluation today patient appears to be doing well currently in regard to her wound. She has been tolerating the dressing changes without complication with total contact casting. I am going to be helping take care of her at this point as she is transferring from Dr. Mikey Bussing to me since Dr. Mikey Bussing is on maternity leave. 07-24-2023 upon  evaluation today patient appears to be doing decently well currently in regard to her wound. She is actually showing signs of improvement in regard to the left foot and I feel like this is much better than last week no debridement even necessary today. We may have to perform some slight debridement come next week we will see how things go. Monitor for any signs of worsening overall and obviously if anything changes she should be letting us know. 08-01-2023 upon evaluation today patient appears to be doing excellent in regard to her feet. The right foot/heel area is completely healed she does have some callus buildup I am going to trim this down [name] little bit I do not want to build up to the point of cracking she is doing well with the urea cream. With regard to the left heel she is doing well with casting I am going to perform some debridement here to clear away before reapplying the cast and I think she is really doing quite well though. 08-07-2023 upon evaluation patient's wound actually showed signs of excellent improvement and actually very pleased with where we stand I do believe that she is making good headway towards complete closure which is great news. 08-14-2023 upon evaluation today patient appears to be doing well currently in regard to her wound. She actually is going require [name] little bit of debridement but fortunately nothing too significant today actually feel like she is doing much better. This is mainly to remove callus on the little slough and biofilm down to good subcutaneous tissue. In general I think that she is making excellent headway here. 08-21-2023 upon evaluation today patient appears to be doing well currently in regard to her wound which is actually showing signs of significant improvement. I am extremely pleased with where things stand I do not see any signs of infection at this time which is great news and overall I do think that we are making headway towards complete  closure which is also excellent news. 08-30-2023 upon evaluation today patient's wound is showing signs of continued improvement. Fortunately I do not see any evidence of worsening overall and I believe that the patient is making excellent headway towards closure. I am extremely pleased with where we stand. 09-04-2023 upon evaluation today patient appears to be doing well currently in regard to her foot  ulcer. She has been tolerating the dressing changes without complication. Fortunately I do not see any signs of active infection locally or systemically at this time which is great news. No fevers, chills, nausea, vomiting, or diarrhea. 09-11-2023 upon evaluation today patient appears to be doing well currently in regard to her wound on the left foot. I feel like she is actually showing signs of improvement I feel like the collagen is doing [name] good job. With that being said unfortunately she has an area where there is [name] spot on the right foot that has not really open but at least there looks like there may have been something that popped off of this area. 09-18-2023 upon evaluation today patient appears to be doing well currently in regard to her wound although to be honest she still continues to have issues here with the wound not healing as rapidly as I would expect. She does [name] lot of walking on the cast from what I am hearing today and to be honest as I mention to her previously this cast is not exactly appropriate for her based on the size of her leg is technically too small and I think this is causing issues with rubbing and therefore callus buildup that would not otherwise occur with [name] properly fitting cast. I discussed that with her today and I think that potentially having [name] offloading shoe would be better specifically [name] front offloading boot. 09-25-2023 upon evaluation patient's wound is showing signs of improvement which is great news and in general I do feel like that she is really doing quite  well. I do not see any signs of active infection at this time which is excellent and in general I think that we are moving in the right direction here. She did get the offloading boot off of [employer] that working to get [name] try it is [name] front off loader which I think should do quite well and we will get [name] see how that does over the next week. 10-02-2023 upon evaluation today patient's wound actually showing signs of good improvement. I actually feel like it is overall looking better there is still quite [name] bit of callus but I did go ahead and perform debridement today to clear this away. She in general seems to be doing quite well. 10-09-2023 upon evaluation today patient unfortunately appears to be doing worse in regard to her wound she is showing signs of infection and this is not good. Fortunately I do not see any evidence of deeper wound but at the same time I do feel like there is some area underneath the callus where this has spread again we have previously gotten that healed. I think that she is probably going to need some oral antibiotic therapy. Electronic Signature(s) Signed: 10/09/2023 9:56:14 AM By: [name] PA-C Entered By: [name] on 10/09/2023 09:56:14 -------------------------------------------------------------------------------- Physical Exam Details Patient Name: Date of Service: YEE, POORMAN 10/09/2023 9:00 [name] RUSSIE, PINHO [name] ([id number]) [phone].pdf Page 10 of 22 Medical Record Number: [id number] Patient Account Number: [id number] Date of Birth/Sex: Treating RN: [birth year] (47 y.o. [name] Primary Care Provider: [name] Other Clinician: [name] Referring Provider: Treating Provider/Extender: [name] in Treatment: 73 Constitutional Well-nourished and well-hydrated in no acute distress. Respiratory normal breathing without difficulty. Psychiatric this patient is able to make decisions and  demonstrates good insight into disease process. Alert and Oriented x 3. pleasant and cooperative. Notes Upon inspection patient's wound bed actually showed  signs of good granulation and epithelization at this point in some areas although there was [name] lot of callus buildup and [name] lot of erythema noted around the edges of the wound as well. I discussed with the patient that I do believe this is likely representative of infection and I will go ahead and see what I can do. I did perform [name] fairly extensive debridement to clear away as much of the callus is possible she tolerated that today without complication and postdebridement the wound bed appears to be doing better from that standpoint. Still there are some signs of erythema here and again I did obtain [name] wound culture that we can send for PCR also did go ahead and tell her that I would put her on an antibiotic as well. Electronic Signature(s) Signed: 10/09/2023 9:58:49 AM By: [name] PA-C Entered By: [name] on 10/09/2023 09:58:48 -------------------------------------------------------------------------------- Physician Orders Details Patient Name: Date of Service: Heather Jefferson [name]. 10/09/2023 9:00 [name]: [id number] Patient Account Number: [id number] Date of Birth/Sex: Treating RN: November 13, 1975 (47 y.o. 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 Bathing/ Shower/ Hygiene May shower; gently cleanse wound with antibacterial soap, rinse and pat dry prior to dressing wounds No tub bath. Off-Loading Open toe surgical shoe - right foot KYUNG, MOEHRING [name] ([id number]) [phone].pdf Page 11 of 22 Other: - front offloading boot left foot Medications-Please add to medication list. ntibiotics - start Cipro as directed P.O. [name] Wound Treatment Wound #12R - Foot Wound Laterality: Plantar, Left, Medial Cleanser: Vashe 5.8 (oz) Every Other Day/30 Days Discharge Instructions: Use vashe 5.8 (oz) as directed Topical: Mupirocin Ointment Every Other Day/30 Days Discharge Instructions: Apply as directed by provider. Prim Dressing: Hydrofera Blue Ready Transfer Foam, 2.5x2.5 (in/in) Every Other Day/30 Days ary Discharge Instructions: Apply Hydrofera Blue Ready to wound bed as directed Secondary Dressing: Gauze Every Other Day/30 Days Discharge Instructions: As directed: dry, moistened with saline or moistened with Dakins Solution Secured With: Medipore T - 83M Medipore H Soft Cloth Surgical T ape ape, 2x2 (in/yd) Every Other Day/30 Days Secured With: [employer] Sterile or Non-Sterile 6-ply 4.5x4 (yd/yd) Every Other Day/30 Days Discharge Instructions: Apply Kerlix as directed Secured With: [employer] Dressing, Latex-free, Size 5, Small-Head / Shoulder / Thigh Every Other Day/30 Days Laboratory Bacteria identified in Wound by Culture (MICRO) - wound culture, Vikor LOINC Code: [id number] Convenience Name: Wound culture routine Patient Medications llergies: erythromycin base, Neosporin (neo-bac-polym) [name] Notifications Medication Indication Start End 10/09/2023 Cipro DOSE 1 - oral 500 mg tablet - 1 tablet oral taken 3 times per week after completion of dialysis on Tuesday, Thursday, and Saturday. Do  not take Trazadone while on this Electronic Signature(s) Signed: 10/09/2023 10:04:13 AM By: [name] PA-C Entered By: [name] on 10/09/2023 10:04:12 -------------------------------------------------------------------------------- Problem List Details Patient Name: Date of Service: [name], Honor [name]. 10/09/2023 9:00 [name] Medical Record  Number: [id number] Patient Account Number: [id number] Date of Birth/Sex: Treating RN: 02-27-1976 (47 y.o. [name] Primary Care Provider: [name] Other Clinician: [name] Referring Provider: Treating Provider/Extender: [name] Weeks in Treatment: 74 Active Problems ICD-10 JAZLY, EMPEY [name] ([id number]) [id number] Page 12 of 22 Encounter Code Description Active Date MDM Diagnosis E11.621 Type 2 diabetes mellitus with foot ulcer 03/16/2022 No Yes L97.528 Non-pressure chronic ulcer of other part of left foot with other specified 03/16/2022 No Yes severity E11.42 Type 2 diabetes mellitus with diabetic polyneuropathy 03/16/2022 No Yes Z79.4 Long term (current) use of insulin 02/22/2023 No Yes N18.6 End stage renal disease 07/14/2023 No Yes Z99.2 Dependence on renal dialysis 07/14/2023 No Yes L84 Corns and callosities 08/01/2023 No Yes Inactive Problems Resolved Problems ICD-10 Code Description Active Date Resolved Date L97.512 Non-pressure chronic ulcer of other part of right foot with fat layer exposed 03/16/2022 03/16/2022 L97.811 Non-pressure chronic ulcer of other part of right lower leg limited to breakdown of skin 03/16/2022 03/16/2022 W54.0XXA Bitten by dog, initial encounter 05/17/2023 05/17/2023 S81.801A Unspecified open wound, right lower leg, initial encounter 05/17/2023 05/17/2023 N82.956 Type 2 diabetes mellitus with other skin ulcer 05/17/2023 05/17/2023 Electronic Signature(s) Signed: 10/09/2023 9:11:00 AM By: [name] PA-C Entered By: [name] on 10/09/2023  09:11:00 -------------------------------------------------------------------------------- Progress Note Details Patient Name: Date of Service: Heather Jefferson [name]. 10/09/2023 9:00 [name] KASIYA, COCKROFT [name] ([id number]) [phone].pdf Page 13 of 22 Medical Record Number: [id number] Patient Account Number: [id number] Date of Birth/Sex: Treating RN: [birth year] (47 y.o. [name] Primary Care Provider: [name] Other Clinician: [name] Referring Provider: Treating Provider/Extender: [name] in Treatment: 54 Subjective Chief Complaint Information obtained from Patient Left foot ulcer History of Present Illness (HPI) 01/18/18-She is here for initial evaluation of the left great toe ulcer. She is [name] poor historian in regards to timeframe in detail. She states approximately 4 weeks ago she lacerated her toe on something in the house. She followed up with her primary care who placed her on Bactrim and ultimately [name] second dose of Bactrim prior to coming to wound clinic. She states she has been treating the toe with peroxide, Betadine and [name] Band-Aid. She did not check her blood sugar this morning but checked it yesterday morning it was 327; she is unaware of [name] recent A1c and there are no current records. She saw Dr. she would've orthopedics last week for an old injury to the left ankle, she states he did not see her toe, nor did she bring it to his attention. She smokes approximately 1 pack cigarettes [name] day. Her social situation is concerning, she arrives this morning with her mother who appears extremely intoxicated/under the influence; her mother was asked to leave the room and be monitored by the patient's grandmother. The patient's aunt then accompanied the patient and the room throughout the rest of the appointment. We had [name] lengthy discussion regarding the deleterious effects of uncontrolled hyperglycemia and smoking as it relates to  wound healing and overall health. She was strongly encouraged to decrease her smoking and get her diabetes under better control. She states she is currently on [name] diet and has cut down her [facility] consumption. The left toe is erythematous, macerated and slightly edematous with malodor present. The edema in her left foot is below her baseline, there is no erythema streaking. We will treat her with Santyl, doxycycline; we have ordered and xray, culture and provided [name] Peg assist surgical  shoe and cultured the wound. 01/25/18-She is here in follow-up evaluation for [name] left great toe ulcer and presents with an abscess to her suprapubic area. She states her blood sugars remain elevated, feeling "sick" and if levels are below 250, but she is trying. She has made no attempt to decrease her smoking stating that we "can't take away her food in her cigarettes". She has been compliant with offloading using the PEG assist you. She is using Santyl daily. the culture obtained last week grew staph aureus and Enterococcus faecalis; continues on the doxycycline and Augmentin was added on Monday. The suprapubic area has erythema, no femoral variation, purple discoloration, minimal induration, was accessed with [name] cotton tip applicator with sanguinopurulent drainage, this was cultured, I suspect the current antibiotic treatment will cover and we will not add anything to her current treatment plan. She was advised to go to urgent care or ER with any change in redness, induration or fever. 02/01/18-She is here in follow-up evaluation for left great toe ulcers and [name] new abdominal abscess from last week. She was able to use packing until earlier this week, where she "forgot it was there". She states she was feeling ill with GI symptoms last week and was not taking her antibiotic. She states her glucose levels have been predominantly less than 200, with occasional levels between 200-250. She thinks this was contributing to her  GI symptoms as they have resolved without intervention. There continues to be significant laceration to left toe, otherwise it clinically looks stable/improved. There is now less superficial opening to the lateral aspect of the great toe that was residual blister. We will transition to [facility] to all wounds, she will continue her Augmentin. If there is no change or deterioration next week for reculture. 02/08/18-She is here in follow-up evaluation for left great toe ulcer and abdominal ulcer. There is an improvement in both wounds. She has been wrapping her left toe with coban, not by our direction, which has created an area of discoloration to the medial aspect; she has been advised to NOT use coban secondary to her neuropathy. She states her glucose levels have been high over this last week ranging from 200-350, she continues to smoke. She admits to being less compliant with her offloading shoe. We will continue with same treatment plan and she will follow-up next week. 02/15/18-She is here in follow-up evaluation for left great toe ulcer and abdominal ulcer. The abdominal ulcer is epithelialized. The left great toe ulcer is improved and all injury from last week using the Coban wrap is resolved, the lateral ulcer is healed. She admits to noncompliance with wearing offloading shoe and admits to glucose levels being greater than 300 most of the week. She continues to smoke and expresses no desire to quit. There is one area medially that probes deeper than it has historically, erythema to the toe and dorsal foot has consistently waxed and waned. There is no overt signs of cellulitis or infection but we will culture the wound for any occult infection given the new area of depth and erythema. We will hold off on sensitivities for initiation of antibiotic therapy. 02/22/18-She is here in follow up evaluation for left great toe ulcer. There is overall significant improvement in both wound appearance,  erythema and edema with changes made last week. She was not initiated on antibiotic therapy. Culture obtained last week showed oxacillin sensitive staph aureus, sensitive to clindamycin. Clindamycin has been called into the pharmacy but she has been  instructed to hold off on initiation secondary to overall clinical improvement and her history of antibiotic intolerance. She has been instructed to contact the clinic with any noted changes/deterioration and the wound, erythema, edema and/or pain. She will follow-up next week. She continues to smoke and her glucose levels remain elevated >250; she admits to compliance with offloading shoe 03/01/18 on evaluation today patient appears to be doing fairly well in regard to her left first toe ulcer. She has been tolerating the dressing changes with the [facility] Dressing without complication and overall this has definitely showed signs of improvement according to records as well is what the patient tells me today. I'm very pleased in that regard. She is having no pain today 03/08/18 She is here for follow up evaluation of [name] left great toe ulcer. She remains non-compliant with glucose control and smoking cessation; glucose levels consistently >200. She states that she got new shoe inserts/peg assist. She admits to compliance with offloading. Since my last evaluation there is significant improvement. We will switch to prisma at this time and she will follow up next week. She is noted to be tachycardic at this appointment, heart rate 120s; she has [name] history of heart rate 70-130 according to our records. She admits to extreme agitation r/t personal issues; she was advised to monitor her heartrate and contact her physician if it does not return to [name] more normal range (<100). She takes cardizem twice daily. 03/15/18-She is here in follow-up evaluation for left great toe ulcer. She remains noncompliant with glucose control and smoking cessation. She admits  to compliance with wearing offloading shoe. The ulcer is improved/stable and we will continue with the same treatment plan and she will follow-up next week 03/22/18-She is here for evaluation for left great toe ulcer. There continues to be significant improvement despite recurrent hyperglycemia (over 500 yesterday) and she continues to smoke. She has been compliant with offloading and we will continue with same treatment plan and she will follow-up next week. 03/29/18-She is here for evaluation for left great toe ulcer. Despite continuing to smoke and uncontrolled diabetes she continues to improve. She is compliant with offloading shoe. We will continue with the same treatment plan and she will follow-up next week 04/05/18- She is here in follow up evaluation for [name] left great toe ulcer; she presents with small pustule to left fifth toe (resembles ant bite). She admits to compliance with wearing offloading shoe; continues to smoke or have uncontrolled blood glucose control. There is more callus than usual with evidence of bleeding; she denies known trauma. 04/12/18-She is here for evaluation of left great toe ulcer. Despite noncompliance with glycemic control and smoking she continues to make improvement. She continues to wear offloading shoe. The pustule, that was identified last week, to the left fifth toe is resolved. She will follow-up in 2 weeks 05/03/18-she is seen in follow-up evaluation for [name] left great toe ulcer. She is compliant with offloading, otherwise noncompliant with glycemic control and smoking. She has plateaued and there is minimal improvement noted. We will transition to [facility], replaced the insert to her surgical shoe and she will follow-up in one week 05/10/18- She is here in follow up evaluation for [name] left great toe ulcer. It appears stable despite measurement change. We will continue with same treatment plan and follow up next week. 05/24/18-She is seen in follow-up evaluation  for [name] left great toe ulcer. She remains compliant with offloading, has made significant improvement in her diet,  decreasing the amount of sugar/soda. She said her recent A1c was 10.9 which is lower than. She did see [name] diabetic nutritionist/educator yesterday. She continues to smoke. We will continue with the same treatment plan and she'll follow-up next week. 05/31/18- She is seen in follow-up evaluation for left great toe ulcer. She continues to remain compliant with offloading, continues to make improvement in her diet, increasing her water and decreasing the amount of sugar/soda. She does continue to smoke with no desire to quit. We will apply Prisma to the depth and Hydrofera Blue over. We have not received insurance authorization for oasis. She will follow up next week. 06/07/18-She is seen in follow-up evaluation for left great toe ulcer. It has stalled according to today's measurements although base appears stable. She says she saw [name] diabetic educator yesterday; her average blood sugars are less than 300 which is an improvement for her. She continues to smoke and states "that's my next step" She continues with water over soda. We will order for xray, culture and reinstate ace wrap compression prior to placing apligraf for next week. She is voicing no complaints or concerns. Her dressing will change to iodoflex over the next week in preparation for apligraf. 06/14/18-She is seen in follow-up evaluation for left great toe ulcer. Plain film x-ray performed last week was negative for osteomyelitis. Wound culture obtained CIAN, BALDASSARRE [name] ([id number]) [phone].pdf Page 14 of 22 last week grew strep B and OSSA; she is initiated on keflex and cefdinir today; there is erythema to the toe which could be from ace wrap compression, she has [name] history of wrapping too tight and has has been encouraged to maintain ace wraps that we place today. We will hold off on application of apligraf  today, will apply next week after antibiotic therapy has been initiated. She admits today that she has resumed taking [name] shower with her foot/toe submerged in water, she has been reminded to keep foot/toe out of the bath water. She will be seen in follow up next week 06/21/18-she is seen in follow-up evaluation for left great toe ulcer. She is tolerating antibiotic therapy with no GI disturbance. The wound is stable. Apligraf was applied today. She has been decreasing her smoking, only had 4 cigarettes yesterday and 1 today. She continues being more compliant in diabetic diet. She will follow-up next week for evaluation of site, if stable will remove at 2 weeks. 06/28/18- She is here in follow up evalution. Apligraf was placed last week, she states the dressing fell off on Tuesday and she was dressing with hydrofera blue. She is healed and will be discharged from the clinic today. She has been instructed to continue with smoking cessation, continue monitoring glucose levels, offloading for an additional 4 weeks and continue with hydrofera blue for additional two weeks for any possible microscopic opening. Readmission: 08/07/18 on evaluation today patient presents for reevaluation concerning the ulcer of her right great toe. She was previously discharged on 06/28/18 healed. Nonetheless she states that this began to show signs of drainage she subsequently went to her primary care provider. Subsequently an x-ray was performed on 08/01/18 which was negative. The patient was also placed on antibiotics at that time. Fortunately they should have been effective for the infection. Nonetheless she's been experiencing some improvement but still has [name] lot of drainage coming from the wound itself. 08/14/18 on evaluation today patient's wound actually does show signs of improvement in regard to the erythema at this point. She has completed the  antibiotics. With that being said we did discuss the possibility of placing her  in [name] total contact cast as of today although I think that I may want to give this just [name] little bit more time to ensure nothing recurrence as far as her infection is concerned. I do not want to put in the cast and risk infection at that time if things are not completely resolved. With that being said she is gonna require some debridement today. 08/21/18 on evaluation today patient actually appears to be doing okay in regard to her toe ulcer. She's been tolerating the dressing changes without complication. With that being said it does appear that she is ready and in fact I think it's appropriate for [place] to go ahead and initiate the total contact cast today. Nonetheless she will require some sharp debridement to prepare the wound for application. Overall I feel like things have been progressing well but we do need to do something to get this to close more readily. 08/24/18 patient seen today for reevaluation after having had the total contact cast applied on Tuesday. She seems to have done very well the wound appears to be doing great and overall I'm pleased with the progress that she's made. There were no abnormal areas of rubbing from the cast on her lower extremity. 08/30/18 on evaluation today patient actually appears to be completely healed in regard to her plantar toe ulcer. She tells me at this point she's been having [name] lot of issues with the cast. She almost fell [name] couple of times the state shall the step of her dog [name] couple times as well. This is been [name] very frustrating process for her other nonetheless she has completely healed the wound which is excellent news. Overall there does not appear to be the evidence of infection at this time which is great news. 09/11/18 evaluation today patient presents for follow-up concerning her great toe ulcer on the left which has unfortunately reopened since I last saw her which was only [name] couple of weeks ago. Unfortunately she was not able to get in to get the  shoe and potentially the AFO that's gonna be necessary due to her left foot drop. She continues with offloading shoe but this is not enough to prevent her from reopening it appears. When we last had her in the total contact cast she did well from [name] healing standpoint but unfortunately the wound reopened as soon as she came out of the cast within just [name] couple of weeks. 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 This is also part of the reason why she falls on [name] regular basis. I just do not believe that is gonna be safe for [place] to reinitiate the total contact cast as last time we had this on she fell 3 times one week which is definitely not normal for her. 09/18/18 upon evaluation today the patient actually appears to be doing about the same in regard to her toe ulcer. She did not contact [facility] as I asked her to even though I had given her the prescription. In fact she actually states that she has no idea where the prescription is. She did apparently call [facility] and they told her that all she needed to do was bring the  prescription in order to be able to be seen and work on getting the AFO for her left foot. With all that being said she still does not have an appointment and I'm not sure were things stand that regard. I will give her [name] new prescription today in order to contact them to get this set up. 09/25/18 on evaluation today patient actually appears to be doing about the same in regard to her toes ulcer. She does have [name] small areas which seems to have [name] lot of callous buildup around the edge of the wound which is going to need sharp debridement today. She still is waiting to be scheduled for evaluation with [facility] for possibility of an AFO. She states there supposed to call her tomorrow to get this  set up. Unfortunately it does appear that her foot specifically the toe area is showing signs of erythema. There does not appear to be any systemic infection which is in these good news. 10/02/18 on evaluation today patient actually appears to be doing about the same in regard to her toe ulcer. This really has not done too well although it's not significantly larger it's also not significantly smaller. She has been tolerating the dressing changes without complication. She actually has her appointment with [facility] and [facility] tomorrow to hopefully be measured for obtaining and AFO splint. I think this would be helpful preventing this from reoccurring. We had contemplated starting the cast this week although to be honest I am reluctant to do that as she's been having nausea, vomiting, and seizure activity over the past three days. She has [name] history of seizures and have been told is nothing that can be done for these. With that being said I do believe that along with the seizures have the nausea vomiting which upon further questioning doesn't seem to be the normal for her and makes me concerned for the possibility of infection or something else going on. I discussed this with the patient and her mother during the office visit today. I do not feel the wound is effective but maybe something else. The responses this was "this just happens to her at times and we don't know why". They did not seem to be interested in going to the hospital to have this checked out further. 10/09/18 on evaluation today patient presents for follow-up concerning her ongoing toe ulcer. She has been tolerating the dressing changes without complication. Fortunately there does not appear to be any evidence of infection which is great news however I do think that the patient would benefit from going ahead for with the total contact cast. She's actually in [name] wheelchair today she tells me that she will use her walker if we initiate the  cast. I was very specific about the fact that if we were gonna do the cast I wanted to make sure that she was using the walker in order to prevent any falls. She tells me she does not have stairs that she has to traverse on [name] regular basis at her home. She has not had any seizures since last week again that something that happens to her often she tells me she did talk to [employer] and they said that it may take up to three weeks to get the brace approved for her. Hopefully that will not take that long but nonetheless in the meantime I do think the cast could be of benefit. 10/12/18 on evaluation today patient appears to be doing rather well in regard to her  toe ulcer. It's just been [name] few days and already this is significantly improved both as far as overall appearance and size. Fortunately there's no sign of infection. She is here for her first obligatory cast change. 10/19/18 Seen today for follow up and management of left great toe ulcer. Wound continues to show improvement. Noted small open area with seroussang drainage with palpation. Denies any increased pain or recent fevers during visit. She will continue calcium alginate with offloading shoe. Denies any questions or concerns during visit. 10/26/18 on evaluation today patient appears to be doing about the same as when I last saw her in regard to her wound bed. Fortunately there does not appear to be any signs of infection. Unfortunately she continues to have [name] breakdown in regard to the toe region any time that she is not in the cast. It takes almost no time at all for this to happen. Nonetheless she still has not heard anything from the brace being made by [employer] as to when exactly this will be available to her. Fortunately there is no signs of infection at this time. 10/30/18 on evaluation today patient presents for application of the total contact cast as we just received him this morning. Fortunately we are gonna be able to apply this to her  today which is great news. She continues to have no significant pain which is good news. Overall I do feel like things have been improving while she was the cast is when she doesn't have [name] cast that things get worse. She still has not really heard anything from [employer] regarding her brace. 11/02/18 upon evaluation today patient's wound already appears to be doing significantly better which is good news. Fortunately there does not appear to be any signs of infection also good news. Overall I do think the total contact cast as before is helping to heal this area unfortunately it's just not gonna likely keep the ASHLEA, ALLINGER [name] ([id number]) [phone].pdf Page 15 of 22 area closed and healed without her getting her brace at least. Again the foot drop is [name] significant issue for her. 11/09/18 on evaluation today patient appears to be doing excellent in regard to her toe ulcer which in fact is completely healed. Fortunately we finally got the situation squared away with the paperwork which was needed to proceed with getting her brace approved by Medicaid. I have filled that out unfortunately that information has been sent to the orthopedic office that I worked at 2 1/2 years ago and not tired Current wound care measures. Fortunately she seems to be doing very well at this time. 11/23/18 on evaluation today patient appears to be doing More Poorly Compared to Last Time I Saw Her. At [facility] She Had Completely Healed. Currently she is continuing to have issues with reopening. She states that she just found out that the brace was approved through Medicaid now she just has to go get measured in order to have this fitted for her and then made. Subsequently she does not have an appointment for this yet that is going to complicate things we obviously cannot put her back in the cast if we do not have everything measured because they're not gonna be able to measure her foot while she is in  the cast. 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 Obviously this is unfortunate and does have me somewhat worried as  well. 11/30/18 on evaluation today patient's toe ulcer actually appears to be doing fairly well. The good news is she will be getting her brace in the shoes next week on Wednesday. Hopefully we will be able to get this to heal without having to go back in the cast however she may need the cast in order to get the wound completely heal and then go from there. Fortunately there's no signs of infection at this time. 12/07/18 on evaluation today patient fortunately did receive her brace and she states she could tell this definitely makes her walk better. With that being said she's been having issues with her toe where she noticed yesterday there was [name] lot of tissue that was loosing off this appears to be much larger than what it was previous. She also states that her leg has been read putting much across the top of her foot just about the ankle although this seems to be receiving somewhat. The total area is still red and appears to be someone infected as best I can tell. She is previously taken Bactrim and that may be [name] good option for her today as well. We are gonna see what I wound culture shows as well and I think that this is definitely appropriate. With that being said outside of the culture I still need to initiate something in the interim and that's what I'm gonna go ahead and select Bactrim is [name] good option for her. 12/14/18 on evaluation today patient appears to be doing better in regard to her left great toe ulcer as compared to last week's evaluation. There's still some erythema although this is significantly improved which is excellent news. Overall I do believe that she is making good progress is still gonna take some time before she is where I would like her to be from the  standpoint of being able to place her back into the total contact cast. Hopefully we will be where we need to be by next week. 12/21/18 on evaluation today patient actually appears to be doing poorly in regard to her toe ulcer. She's been tolerating the dressing changes without complication. Fortunately there's no signs of systemic infection although she does have [name] lot of drainage from the toe ulcer and this does seem to be causing some issues at this point. She does have erythema on the distal portion of her toe that appears to be likely cellulitis. 12/28/18 on evaluation today patient actually appears to be doing [name] little better in my pinion in regard to her toe ulcer. With that being said she still does have some evidence of infection at this time and for her culture she had both E. coli as well as enterococcus as organisms noted on evaluation. For that reason I think that though the Keflex likely has treated the E. coli rather well this has really done nothing for the enterococcus. We are going to have to initiate treatment for this specifically. 01/04/19 on evaluation today patient's toe actually appears to be doing better from the standpoint of infection. She currently would like to see about putting the cash back on I think that this is appropriate as long as she takes care of it and keeps it from getting wet. She is gonna have some drainage we can definitely pass this up with Drawtex and alginate to try to prevent as much drainage as possible from causing the problems. With that being said I do want to at least try her with the cast between now  and Tuesday. If there any issues we can't continue to use it then I will discontinue the use of the cast at that point. 01/08/19 on evaluation today patient actually appears to be doing very well as far as her foot ulcer specifically the great toe on the left is concerned. She did have an area of rubbing on the medial aspect of her left ankle which again is  from the cast. Fortunately there's no signs of infection at this point in this appears to be [name] very slight skin breakdown. The patient tells me she felt it rubbing but didn't think it was that bad. Fortunately there is no signs of active infection at this time which is good news. No fevers, chills, nausea, or vomiting noted at this time. 01/15/19 on evaluation today patient actually appears to be doing well in regard to her toe ulcer. Again as previous she seems to do well and she has the cast on which indicates to me that during the time she doesn't have [name] cast on she's putting way too much pressure on this region. Obviously I think that's gonna be an issue as with the current national emergency concerning the Covid-19 Virus it has been recommended that we discontinue the use of total contact casting by the chief medical officer of our company, Dr. Maurine Minister. The reasoning is that if [name] patient becomes sick and cannot come into have the cast removed they could not just leave this on for an additional two weeks. Obviously the hospitals also do not want to receive patient's who are sick into the emergency department to potentially contaminate the region and spread the Covid-19 Virus among other sick individuals within the hospital system. Therefore at this point we are suspending the use of total contact cast until the current emergency subsides. This was all discussed with the patient today as well. 01/22/19 on evaluation today patient's wound on her left great toe appears to be doing slightly worse than previously noted last week. She tells me that she has been on this quite [name] bit in fact she tells me she's been awake for 38 straight hours. This is due to the fact that she's having to care for grandparents because nobody else will. She has been taking care of them for five the last seven days since I've seen her they both have dementia his is from [name] stroke and her grandmother's was progressive. Nonetheless  she states even her mom who knows her condition and situation has only help two of those days to take care of them she's been taking care of the rest. Fortunately there does not appear to be any signs of active infection in regard to her toe at this point although obviously it doesn't look as good as it did previous. I think this is directly related to her not taking off the pressure and friction by way of taking things easy. Though I completely understand what's going on. 01/29/19 on evaluation today patient's tools are actually appears to be showing some signs of improvement today compared to last week's evaluation as far as not necessarily the overall size of the wound but the fact that she has some new skin growth in between the two ends of the wound opening. Overall I feel like she has done well she states that she had [name] family member give her what sounds to be [name] CAM walker boot which has been helpful as well. 02/05/19 on evaluation today patient's wound bed actually appears to be doing  significantly better in regard to her overall appearance of the size of the wound. With that being said she is still having an issue with offloading efficiently enough to get this to close. Apparently there is some signs of infection at this point as well unfortunately. Previously she's done well of Augmentin I really do not see anything that needs to be culture currently but there theme and cellulitis of the foot that I'm seeing I'm gonna go ahead and place her on an antibiotic today to try to help clear this up. 02/12/2019 on evaluation today patient actually appears to be doing poorly in regard to her overall wound status. She tells me she has been using her offloading shoe but actually comes in today wearing her tennis shoe with the AFO brace. Again as I previously discussed with her this is really not sufficient to allow the area to heal appropriately. Nonetheless she continues to be somewhat noncompliant and I do  wonder based on what she has told my nurse in the past as to whether or not [name] good portion of this noncompliance may be recreational drug and alcohol related. She has had [name] history of heroin overdose and this was fairly recently in the past couple of months that have been seeing her. Nonetheless overall I feel like her wound looks significantly worse today compared to what it was previous. She still has significant erythema despite the Augmentin I am not sure that this is an appropriate medication for her infection I am also concerned that the infection may have gone down into her bone. 02/19/19 on evaluation today patient actually appears to be doing about the same in regard to her toe ulcer. Unfortunately she continues to show signs of bone exposure and infection at this point. There does not appear to be any evidence of worsening of the infection but I'm also not really sure that it's getting significantly better. She is on the Augmentin which should be sufficient for the Staphylococcus aureus infection that she has at this point. With that being said she may need IV antibiotics to more appropriately treat this. We did have [name] discussion today about hyperbaric option therapy. 02/28/19 on evaluation today patient actually appears to be doing much worse in regard to the wound on her left great toe as compared to even my previous evaluation last week. Unfortunately this seems to be training in [name] pretty poor direction. Her toe was actually now starting to angle laterally and I can actually see the entire joint area of the proximal portion of the digit where is the distal portion of the digit again is no longer even in contact with the joint line. Unfortunately there's [name] lot more necrotic tissue around the edge and the toe appears to be showing signs of becoming gangrenous in my pinion. I'm very concerned about were things stand at this point. She did see infectious disease and they are planning to send in [name]  prescription for Sivextro for her and REIZEL, ELSBERRY [name] ([id number]) [id number] Page 16 of 22 apparently this has been approved. With that being said I don't think she should avoid taking this but at the same time I'm not sure that it's gonna be sufficient to save her toe at this point. She tells me that she still having to care for grandparents which I think is putting quite [name] bit of strain on her foot and specifically the total area and has caused this to break down even to [name] greater degree than would've  otherwise been expected. 03/05/19 on evaluation today patient actually appears to be doing quite well in regard to her toe all things considering. She still has bone exposed but there appears to be much less your thing on overall the appearance of the wound and the toe itself is dramatically improved. She still does have some issues currently obviously with infection she did see vascular as well and there concerned that her blood flow to the toad. For that reason they are setting up for an angiogram next week. 03/14/19 on evaluation today patient appears to be doing very poor in regard to her toe and specifically in regard to the ulceration and the fact that she's starting to notice the toe was leaning even more towards the lateral aspect and the complete joint is visible on the proximal aspect of the joint. Nonetheless she's also noted [name] significant odor and the tip of the toe is turning more dark and necrotic appearing. Overall I think she is getting worse not better as far as this is concerned. For that reason I am recommending at this point that she likely needs to be seen for likely amputation. READMISSION 03/19/2021 This is [name] patient that we cared for in this clinic for [name] prolonged period of time in 2019 and 2020 with [name] left foot and left first toe wound. 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 Excell Seltzer. In December 2021 she had an ulcer on her right great toe as well as the fourth and fifth toes. 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 She also had an angiogram at that time and underwent angioplasty of the right anterior tibial artery. In any case she claims that the wound on the right foot is closed I did not look at this today which was probably an oversight although I think that should be done next week. 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 According to Dr. Bernette Redbird last review that she was out of the area being cared for by another physician but recently came back to his attention. 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 [name] culture of this area showed E. coli apparently before she came back to see Dr. Excell Seltzer she was supposed to be receiving antibiotics but she did not really take them. Nor is she offloading this area at all. Finally her last hemoglobin A1c listed in epic was in March 2022 at 14.1 she says things are [name] lot better since then although I am not sure. 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 She had this wound at the time. During this admission she had E. coli sepsis [name] TEE was negative. 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 She is still [name] heavy cigarette smoker. She has [name] history of bipolar disorder seizure disorder ABI in our clinic was 1.05 6/1; left midfoot in the setting of [name] TMA done previously. Round circular wound with [name] "knuckle" of protruding tissue. The problem is that the  knuckle was not attached to any of the surrounding  granulation and this probed proximally widely I removed [name] large portion of this tissue. This wound goes with considerable undermining laterally. I do not feel any bone there was no purulence but this is [name] deep wound. 6/8; in spite of the debridement I did last week. She arrives with [name] wound looking exactly the same. [name] protruding "knuckle" of tissue nonadherent to most of the surrounding tissue. There is considerable depth around this from 6-12 o'clock at 2.7 cm and undermining of 1 cm. This does not look overtly infected and the x- ray I did last week was negative for any osseous abnormalities. We have been using silver collagen 6/15; deep tissue culture I did last week showed moderate staph aureus and moderate Pseudomonas. This will definitely require prolonged antibiotic therapy. The pathology on the protuberant area was negative for malignancy fungus etc. 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 We have been using silver collagen. I am going to be prescribing Levaquin for [address] scan of the foot is down for 7/5 6/22; CT scan of the foot on 7 5. She says she has hardware in the left leg from her previous fracture. She is on the Levaquin for the deep tissue culture I did that showed methicillin sensitive staph aureus and Pseudomonas. I gave her [name] 2-week supply and she will have another week. She arrives in clinic today with the same protuberant tissue however this is nonadherent to the tissue surrounding it. I am really at [name] loss to explain this unless there is underlying deep tissue infection 6/29; patient presents for 1 week follow-up. She has been using collagen to the wound bed. She reports taking her antibiotics as prescribed.She has no complaints or issues today. She denies signs of infection. 7/6; patient presents for one week followup. She has been using collagen to the  wound bed. She states she is taking Levaquin however at times she is not able to keep it down. She denies signs of infection. 7/13; patient presents for 1 week follow-up. She has been using silver alginate to the wound bed. She still has nausea when taking her antibiotics. She denies signs of infection. 7/20; patient presents for 1 week follow-up. She has been using silver alginate with gentamicin cream to the wound bed. She denies any issues and has no complaints today. She denies signs of infection. 7/27; patient presents for 1 week follow-up. She continues to use silver alginate with gentamicin cream to the wound bed. She reports starting her antibiotics. She has no issues or complaints. Overall she reports stability to the wound. 8/3; patient presents for 1 week follow-up. She has been using silver alginate with gentamicin cream to the wound bed. She reports completing all antibiotics. She has no issues or complaints today. She denies signs of infection. 8/17; patient presents for 2-week follow-up. He is to use silver alginate to the wound bed. She has no issues or complaints today. She denies signs of infection. She reports her pain has improved in her foot since last clinic visit 8/24; patient presents for 1 week follow-up. She continues to use silver alginate to the wound bed. She has no issues or complaints. She denies signs of infection. Pain is stable. 9/7; patient presents for follow-up. She missed her last week appointment due to feeling ill. She continues to use silver alginate. She has [name] new wound to the right lower extremity that is covered in eschar. She states It occurred over the  past week and has no idea how it started. She currently denies signs of infection. 9/14; patient presents for follow-up. T the left foot wound she has been using gentamicin cream and silver alginate. T the right lower extremity wound she has o o been keeping this covered and has not obtain Santyl. 9/21;  patient presents for follow-up. She reports using gentamicin cream and silver alginate to the left foot and Santyl to the right lower extremity wound. She has no issues or complaints today. She denies signs of infection. 9/28; patient presents for follow-up. She reports [name] new wound to her right heel. She states this occurred [name] few days ago and is progressively gotten worse. She has been trying to clean the area with [name] Q-tip and Santyl. She reports stability in the other 2 wounds. She has been using gentamicin cream and silver alginate to the left foot and Santyl to the right lower extremity wound. 10/12; patient presents for follow-up. She reports improvement to the wound beds. She is seeing vein and vascular to discuss the potential of [name] left BKA. She states they are going to do an arteriogram. She continues to use silver alginate with dressing changes to her wounds. 11/2; patient presents for follow-up. She states she has not been doing dressing changes to the wound beds. She states she is not able to offload the areas. She reports chronic pain to her left foot wound. 11/9; patient presents for follow-up. She came in with only socks on. She states she forgot to put on shoes. It is unclear if she is doing any dressing changes. She currently denies systemic signs of infection. 11/16; patient presents for follow-up. She came again only with socks on. She states she does not wear shoes ever. It is unclear if she does dressing changes. She currently denies systemic signs of infection. 11/23; patient presents for follow-up. She wore her shoes today. It still unclear exactly what dressing she is using for each wound but she did states she obtained Dakin's solution and has been using this to the left foot wound. She currently denies signs of infection. JACKI, LEBOURGEOIS [name] ([id number]) [id number] Page 17 of 22 11/30; patient presents for follow-up. She has no issues or complaints  today. She currently denies signs of infection. 12/7; patient presents for follow-up. She has no issues or complaints today. She has been using Hydrofera Blue to the right heel wound and Dakin solution to the left foot wound. Her right anterior leg wound is healed. She currently denies signs of infection. 12/14; patient presents for follow-up. She has been using Hydrofera Blue to the right heel and Dakin's to the left foot wounds. She has no issues or complaints today. She denies signs of infection. 12/21; patient presents for follow-up. She reports using Hydrofera Blue to the right heel and Dakin's to the left foot wound. She denies signs of infection. 12/28; patient presents for follow-up. She continues to use Dakin's to the left foot wound and Hydrofera Blue to the right heel wound. She denies signs of infection. 1/4; patient presents for follow-up. She has no issues or complaints today. She denies signs of infection. 1/11; patient presents for follow-up. It is unclear if she has been dressing these wounds over the past week. She currently denies signs of infection. 1/18; patient presents for follow-up. She states she has been using Dakin's wet-to-dry dressings to the left foot. She has been using Hydrofera Blue to the right foot foot wound. She states that the anterior right  leg wound has reopened and draining serous fluid. She denies signs of infection. 1/25; patient presents for follow-up. She has no issues or complaints today. 2/1; patient presents for follow-up. She has no issues or complaints today. She denies signs of infection. 2/8; patient presents for follow-up. She has lost her surgical shoes. She did not have [name] dressing to the right heel wound. She currently denies signs of infection. 2/15; patient presents for follow-up. She reports more pain to the right heel today. She denies purulent drainage Or fever/chills 2/22; patient presents for follow-up. She reports taking clindamycin over  the past week. She states that she continues to have pain to her right heel. She reports purulent drainage. Readmission 03/16/2022 Ms. Ayoka Spake is [name] 47 year old female with [name] past medical history of type 2 diabetes, osteomyelitis to her feet, chronic systolic heart failure and bipolar disorder that presents to the clinic for bilateral feet wounds and right lower extremity wound. She was last seen in our clinic on 12/15/2021. At that time she had purulent drainage coming out of her right plantar foot and I recommended she go to the ED. She states she went to [facility] and has been there for the past 3 months. I cannot see the records. She states she had OR debridement and was on several weeks of IV antibiotics while inpatient. Since discharge she has not been taking care of the wound beds. She had nothing on her feet other than socks today. She currently denies signs of infection. 5/31; patient presents for follow-up. She has been using Dakin's wet-to-dry dressings to the wound beds on her feet bilaterally and antibiotic ointment to the right anterior leg wound. She had [name] wound culture done at last clinic visit that showed moderate Pseudomonas aeruginosa sensitive to ciprofloxacin. She currently denies systemic signs of infection. 6/14; patient presents for follow-up. She received Keystone 5 days ago and has been using this on the wound beds. She states that last week she had to go to the hospital because she had increased warmth and erythema to the right foot. She was started on 2 oral antibiotics. She states she has been taking these. She currently denies systemic signs of infection. She has no issues or complaints today. 6/21; patient presents for follow-up. She states she has been using Keystone antibiotics to the wound beds. She has no issues or complaints today. She denies signs of infection. 6/28; patient presents for follow-up. She has been using Keystone antibiotics to the  wound beds. She has no issues or complaints today. 7/12; patient presents for follow-up. Has been using Keystone antibiotics to the wound beds with calcium alginate. She has no issues or complaints today. She never followed up with her orthopedic surgeon who did the OR debridement to the right foot. We discussed the total contact cast for the left foot and patient would like to do this next week. 7/19; patient presents for follow-up. She has been using Keystone antibiotics with calcium alginate to the wound beds. She has no issues or complaints today. Patient is in agreement to do the total contact cast of the left foot today. She knows to return later this week for the obligatory cast change. 05-13-2022 upon evaluation today patient's wound which she has the cast of the left leg actually appears to be doing significantly better. Fortunately I do not see any signs of active infection locally or systemically which is great news and overall I am extremely pleased with where we stand currently. 7/26;  patient presents for follow-up. She has [name] cast in place for the past week. She states it irritated her shin. Other than that she tolerated the cast well. She states she would like [name] break for 1 week from the cast. We have been using Keystone antibiotic and Aquacel to both wound beds. She denies signs of infection. 8/2; patient presents for follow-up. She has been using Keystone and Aquacel to the wound beds. She denies any issues and has no complaints. She is agreeable to have the cast placed today for the left leg. 06-03-2022 upon evaluation today patient appears to be doing well with regard to her wound she saw some signs of improvement which is great news. Fortunately I do not see any evidence of active infection locally or systemically at this time which is great news. No fevers, chills, nausea, vomiting, or diarrhea. 8/16; patient presents for follow-up. She has no issues or complaints today. We have  been using Keystone and Aquacel to the wound beds. The left lower extremity is in [name] total contact cast. She is tolerated this well. 8/23; patient presents for follow-up. She has had the total contact cast on the left leg for the past week. Unfortunately this has rubbed and broken down the skin to the medial foot. She currently denies signs of infection. She has been using Keystone antibiotic to the right plantar foot wound. 8/30; patient presents for follow-up. We have held off on the total contact cast for the left leg for the past week. Her wound on the left foot has improved and the previous surrounding breakdown of skin has epithelialized. She has been using Keystone antibiotic to both wound beds. She has no issues or complaints today. She denies signs of infection. 9/6; patient presents for follow-up. She has ordered her's Keystone antibiotic and this is arriving this week. She has been doing Dakin's wet-to-dry dressings to the wound beds. She denies signs of infection. She is agreeable to the total contact cast today. 9/13; patient presents for follow-up. She states that the cast caused her left leg shin to be sore. She would like to take [name] break from the cast this week. She has been using Keystone antibiotic to the right plantar foot wound. She denies signs of infection. 9/20; patient presents for follow-up. She has been using Keystone antibiotics to the wound beds with calcium alginate to the right foot wound and Hydrofera Blue to the left foot wound. She is agreeable to having the cast placed today. She has been approved for Apligraf and we will order this for next clinic visit. NASEEM, MAKHOUL [name] ([id number]) [id number] Page 18 of 22 9/27; patient presents for follow-up. We have been using Keystone antibiotic with Hydrofera Blue to the left foot wound under [name] total contact cast. T the right o foot wound she has been using Keystone antibiotic and calcium alginate.  She declines [name] total contact cast today. Apligraf is available for placement and she would like to proceed with this. 07-28-2022 upon evaluation today patient appears to be doing well currently in regard to her wound. She is actually showing signs of significant improvement which is great news. Fortunately I do not see any evidence of active infection locally nor systemically at this time. She has been seeing Dr. Mikey Bussing and to be honest has been doing very well with the cast. Subsequently she comes in today with [name] cast on and we did reapply that today as well. She did not really want to she try to talk  me out of that but I explained that if she wanted to heal this is really the right way to go. Patient voiced understanding. In regard to her right foot this is actually [name] lot better compared to the last time I saw her which is also great news. 10/11; patient presents for follow-up. Apligraf and the total contact cast was placed to the left leg at last clinic visit. She states that her right foot wound had burning pain to it with the placement of Apligraf to this area. She has been doing Reddick over this area. She denies signs of infection including increased warmth, erythema or purulent drainage. 11/1; 3-week follow-up. The patient fortunately did not have [name] total contact cast or an Apligraf and on the left foot. She has been using Keystone ABD pads and kerlix and her own running shoes She arrives in clinic today with thick callus and [name] very poor surface on the left foot on the right nonviable skin subcutaneous tissue and [name] deep probing hole. 11/15; patient missed her last clinic appointment. She states she has not been dressing the wound beds for the past 2 weeks. She states that at she had [name] new roommate but is now going back to live with her mother. Apparently its been [name] distracting 2 weeks. Patient currently denies signs of infection. 11/22; patient presents for follow-up. She states she has been  using Keystone antibiotic and Dakin's wet-to-dry dressings to the wound beds. She is agreeable for cast placement today. We had ordered Apligraf however this has not been received by our facility. 11/29; Patient had [name] total contact cast placed at last clinic visit and she tolerated this well. We were using silver alginate under the cast. Patient's been using Keystone antibiotic with Aquacel to the right plantar foot wound. She has no issues or complaints today. Apligraf is available for placement today. Patient would like to proceed with this. 12/6; patient presents for follow-up. She had Apligraf placed in standard fashion last clinic visit under the total contact cast to the left lower extremity. She has been using Keystone antibiotic and Aquacel to the right plantar foot wound. She has no issues or complaints today. 12/13; patient presents for follow-up. She has finished 5 Apligraf placements. Was told she would not qualify for more. We have been doing [name] total contact cast to the left lower extremity. She has been using Keystone antibiotic and Aquacel to the right plantar foot wound. She has no issues or complaints today. 12/20; patient presents for follow-up. We have been using Hydrofera Blue with Keystone antibiotic under [name] total contact cast of the left lower extremity. She reports using Keystone antibiotic and silver alginate to the right heel wound. She has no issues or complaints today. 12/27; patient presents with [name] healthy wound on the left midfoot. We have Apligraf to apply that to that more also using [name] total contact cast. On the right we are using Keystone and silver alginate. She is offloading the right heel with [name] surgical shoealthough by her admission she is on her feet quite [name] bit 1/3; patient presents for follow-up. Apligraf was placed to the wound beds last clinic visit. She was placed in [name] total contact cast to the left lower extremity. She declines [name] total contact cast today. She  states that her mother is in the hospital and she cannot adequately get around with the cast on. 1/10; patient presents for follow-up. She declined the total contact cast at last clinic visit. Both wounds  have declined in appearance. She states that she has been on her feet and not offloading the wound beds. She currently denies signs of infection. 1/17; patient presents for follow-up. She had the total contact cast along with Apligraf placed last week to the left lower extremity. She tolerated this well. She has been using Aquacel Ag and Keystone antibiotic to the right heel wound. She currently denies signs of infection. She has no issues or complaints today. 1/24; patient presents for follow-up. We have been using Apligraf to the left foot wound along with [name] total contact cast. She has done well with this. T the right o heel wound she has been using Aquacel Ag and Keystone antibiotic ointment. She has no issues or complaints today. She denies signs of infection. 1/31; patient presents for follow-up. We have been using Apligraf to the left foot wound along with the total contact cast. She continues to do well with this. To the right heel we have been using Aquacel Ag and Keystone antibiotic ointment. She has no issues or complaints today. 12-01-2022 upon evaluation patient is seen today on my schedule due to the fact that she unfortunately was in the hospital yesterday. Her cast needed to come off the only reason she is out of the hospital is due to the fact that they would not take it off at the hospital which is somewhat bewildered reading to me to be perfectly honest. I am not certain why this was but either way she was released and then was placed on my schedule today in order to get this off and reapply the total contact casting as appropriate. I do not have an Apligraf for her today it was applied last week and today's actually expired yesterday as there was some scheduling conflicts with her  being in the hospital. Nonetheless we do not have that for reapplication today but the good news that she is not draining too much and the Apligraf can go for up to 2 weeks so I am going to go ahead and reapply the total contact casting but we are going to leave the Apligraf in place. 2/14; patient presents for follow-up. T the left leg she has had the total contact cast and Apligraf for the past week. She has had no issues with this. T the o o right heel she has been using antibiotic ointment and Aquacel Ag. 2/21; patient presents for follow-up. We have been using Apligraf and [name] total contact cast to the left lower extremity. She is tolerated this well. Unfortunately she is not approved for any more Apligraf per insurance. She has been using antibiotic ointment and Aquacel Ag to the right foot. She has no issues or complaints today. 2/28; patient presents for follow-up. We have been using Hydrofera Blue and antibiotic ointment under the total contact cast to the left lower extremity. He has been using Aquacel Ag with antibiotic ointment to the right plantar foot. She has no issues or complaints today. 3/6; patient presents for follow-up. She did not obtain her gentamicin ointment. She has been using Aquacel Ag to the right plantar foot wound. We have been using Hydrofera Blue with antibiotic ointment under the total contact cast to the left lower extremity. She has no issues or complaints today. 3/12; patient presents for follow-up. She has been using gentamicin ointment and Aquacel Ag to the right plantar foot wound. We have been using Hydrofera Blue with antibiotic ointment under the total contact cast on the left lower extremity foot  wound. She has no issues or complaints today. 3/20; patient presents for follow-up. She has been using gentamicin ointment and Aquacel Ag to the right plantar foot wound. We have been using Hydrofera Blue with antibiotic ointment under the total contact cast to the  left lower extremity wound. She has no issues or complaints today. 3/27; patient presents for follow-up. We have been using antibiotic ointment and Aquacel Ag to the right plantar foot wound. We have been using Hydrofera Blue with antibiotic ointment under the total contact cast to the left lower extremity. Both wounds are smaller. 01-24-2023 upon evaluation today patient actually appears to be doing better compared to last time I saw her. It has been several weeks since I last saw her but nonetheless I think the total contact casting is doing [name] good job. Fortunately I do not see any evidence of infection or worsening overall which is great news and in general I do believe that she is moving in the appropriate direction. It has been [name] very long road but nonetheless I feel like she is nearing the end at least in regard to the left foot and even the right foot looks much better to be perfectly honest. 01-31-2023 upon evaluation today patient appears to be doing well currently in regard to her wounds. She has been require some sharp debridement which I think is probably bone both sides to remove [name] lot of callus that has built up at this point. I discussed that with her today. Also think total contact cast is doing well for NEJRA, WINTLE [name] ([id number]) [id number] Page 19 of 22 the left foot Emina continue that as well. 4/17; patient presents for follow-up. We have been doing Hydrofera Blue with antibiotic ointment under the total contact cast to the left lower extremity and Aquacel Ag with antibiotic ointment to the right foot wound. She has been using her offloading heel shoe here. 4/24; patient presents for follow-up. We have been doing Hydrofera Blue with antibiotic ointment under the total contact cast to the left lower extremity and Aquacel Ag with antibiotic ointment to the right foot wound. She has been using her offloading heel shoe here. Wounds are smaller with slough  accumulation. 5/1; patient presents for follow-up. We have been doing Hydrofera Blue with antibiotic ointment under the total contact cast to the left lower extremity and Aquacel Ag with antibiotic ointment to the right foot wound. Left foot wound is stable and right foot wound appears smaller. 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 On the left she is using Hydrofera Blue topical antibiotic under [name] total contact cast, on the right plantar heel gentamicin Aquacel Ag. According to our intake nurse both are making slow but steady improvements. 5/15; patient presents for follow-up. We have been doing Hydrofera Blue with antibiotic ointment under the total contact cast on the left and antibiotic ointment with Aquacel Ag on the right. Unfortunately the left foot wound has declined in size and appearance. We also do not have the total contact cast available in office today. Patient denies signs of infection. 5/22; patient presents for follow-up. We have been doing Hydrofera Blue and antibiotic ointment to the left foot and Aquacel Ag with antibiotic ointment to the right foot. We took [name] break from the cast since we did not have it placed at last week. Wounds look stable if not slightly improved. 03-27-2023 patient presents today she has had this For Most 2 Weeks Because She Was in the Hospital and They Did Not  T It off. Again That Is Really Not ake Something That She Could Control but Nonetheless We Are Seeing Her at This Point Today for Reevaluation. We Do Want to Go and See about Reapply the Cast. Again We Had All Set Everything up to Go Ahead and Do This When We Went to Get the Cast Only to Realize That Not Had Come in and We Were out at the Moment. With That Being [place] Spaniel We Will Get Have T Get the Exam before We Can Reapply the T Contact Casting Going Forward. o otal 6/12; patient presents for follow-up. She has been using Hydrofera Blue to the left foot wound and Aquacel Ag to the right foot wound. No  cast was available last week placed on the patient so she has been offloading with her surgical shoe. She has no issues or complaints today. 6/19; patient presents for follow-up. We have been using Hydrofera Blue to the left foot wound under the total contact cast and Aquacel Ag to the right wound. She has no issues or complaints today. The wounds are smaller. 6/26; patient presents for follow-up. We have been using Hydrofera Blue to the left foot wound under the total contact cast and Aquacel Ag to the right foot wound. Wounds are smaller. Patient has no issues or complaints today. 7/3; patient presents for follow-up. We have been using Hydrofera Blue to the left foot wound under the total contact cast and Aquacel Ag to the right foot wound. Wounds Appear smaller. No signs of infection. 7/10; patient presents for follow-up. We have been using Hydrofera Blue to the left foot wound under the total contact cast and Aquacel Ag to the right foot wound. Left foot wound is smaller. Right foot wound is stable. 7/17; patient presents for follow-up. We have been using Hydrofera Blue to the left foot under the total contact cast and Aquacel Ag to the right foot wound. She has no issues or complaints today. 7/24; patient presents for follow-up. We have been using Hydrofera Blue to the left foot under the total contact cast and Aquacel Ag to the right foot wound. Unfortunately she was bit by her dog over the weekend and developed [name] wound to the right lower leg. She has been keeping the area covered. 7/31; patient presents for follow-up. We have been using Hydrofera Blue to the left foot under the total contact cast and Aquacel Ag to the right foot wound and mupirocin ointment to the right anterior leg wound. She started her oral antibiotics 2 days ago. 8/7; patient presents for follow-up. We have been using Hydrofera Blue and antibiotic ointment to the left foot under the total contact cast and Aquacel Ag  to the right foot wound. Antibiotic ointment to the right anterior leg wound. She completed her oral antibiotics. She has no issues or complaints today. Wounds continue to improve albeit slowly. 8/14; 8/14; patient has 3 wounds 1 on the left plantar heel 1 on the right plantar heel and [name] small wound on the right anterior lower leg. We have been using Hydrofera Blue in the left heel Aquacel on the right heel. The left heel has been in [name] total contact cast 8/21; patient presents for follow-up. We have been using Hydrofera Blue to the left foot wound under the total contact cast. T the right heel patient has been o using Aquacel Ag. T the anterior leg wound antibiotic ointment. All wounds have healed. o 8/28; patient presents for follow-up. She has been using her surgical  shoe with offloading felt pads. Her wounds have remained closed. 9/11; patient presents for final follow-up to assure that her wounds have remained closed. Unfortunately the left foot wound has opened again. But she has been wearing her Surgical shoe for offloading with felt pad. 07-14-2023 upon evaluation today patient appears to be doing well currently in regard to her wound. She has been tolerating the dressing changes without complication with total contact casting. I am going to be helping take care of her at this point as she is transferring from Dr. Mikey Bussing to me since Dr. Mikey Bussing is on maternity leave. 07-24-2023 upon evaluation today patient appears to be doing decently well currently in regard to her wound. She is actually showing signs of improvement in regard to the left foot and I feel like this is much better than last week no debridement even necessary today. We may have to perform some slight debridement come next week we will see how things go. Monitor for any signs of worsening overall and obviously if anything changes she should be letting us know. 08-01-2023 upon evaluation today patient appears to be doing  excellent in regard to her feet. The right foot/heel area is completely healed she does have some callus buildup I am going to trim this down [name] little bit I do not want to build up to the point of cracking she is doing well with the urea cream. With regard to the left heel she is doing well with casting I am going to perform some debridement here to clear away before reapplying the cast and I think she is really doing quite well though. 08-07-2023 upon evaluation patient's wound actually showed signs of excellent improvement and actually very pleased with where we stand I do believe that she is making good headway towards complete closure which is great news. 08-14-2023 upon evaluation today patient appears to be doing well currently in regard to her wound. She actually is going require [name] little bit of debridement but fortunately nothing too significant today actually feel like she is doing much better. This is mainly to remove callus on the little slough and biofilm down to good subcutaneous tissue. In general I think that she is making excellent headway here. 08-21-2023 upon evaluation today patient appears to be doing well currently in regard to her wound which is actually showing signs of significant improvement. I am extremely pleased with where things stand I do not see any signs of infection at this time which is great news and overall I do think that we are making headway towards complete closure which is also excellent news. 08-30-2023 upon evaluation today patient's wound is showing signs of continued improvement. Fortunately I do not see any evidence of worsening overall and I believe that the patient is making excellent headway towards closure. I am extremely pleased with where we stand. KAMALI, CARMOUCHE [name] ([id number]) [id number] Page 20 of 22 09-04-2023 upon evaluation today patient appears to be doing well currently in regard to her foot ulcer. She has been  tolerating the dressing changes without complication. Fortunately I do not see any signs of active infection locally or systemically at this time which is great news. No fevers, chills, nausea, vomiting, or diarrhea. 09-11-2023 upon evaluation today patient appears to be doing well currently in regard to her wound on the left foot. I feel like she is actually showing signs of improvement I feel like the collagen is doing [name] good job. With that being said unfortunately she has  an area where there is [name] spot on the right foot that has not really open but at least there looks like there may have been something that popped off of this area. 09-18-2023 upon evaluation today patient appears to be doing well currently in regard to her wound although to be honest she still continues to have issues here with the wound not healing as rapidly as I would expect. She does [name] lot of walking on the cast from what I am hearing today and to be honest as I mention to her previously this cast is not exactly appropriate for her based on the size of her leg is technically too small and I think this is causing issues with rubbing and therefore callus buildup that would not otherwise occur with [name] properly fitting cast. I discussed that with her today and I think that potentially having [name] offloading shoe would be better specifically [name] front offloading boot. 09-25-2023 upon evaluation patient's wound is showing signs of improvement which is great news and in general I do feel like that she is really doing quite well. I do not see any signs of active infection at this time which is excellent and in general I think that we are moving in the right direction here. She did get the offloading boot off of [employer] that working to get [name] try it is [name] front off loader which I think should do quite well and we will get [name] see how that does over the next week. 10-02-2023 upon evaluation today patient's wound actually showing signs of good  improvement. I actually feel like it is overall looking better there is still quite [name] bit of callus but I did go ahead and perform debridement today to clear this away. She in general seems to be doing quite well. 10-09-2023 upon evaluation today patient unfortunately appears to be doing worse in regard to her wound she is showing signs of infection and this is not good. Fortunately I do not see any evidence of deeper wound but at the same time I do feel like there is some area underneath the callus where this has spread again we have previously gotten that healed. I think that she is probably going to need some oral antibiotic therapy. Objective Constitutional Well-nourished and well-hydrated in no acute distress. Vitals Time Taken: 9:13 AM, Height: 69 in, Weight: 178 lbs, BMI: 26.3, Temperature: 98.6 F, Pulse: 80 bpm, Respiratory Rate: 18 breaths/min, Blood Pressure: 93/54 mmHg. Respiratory normal breathing without difficulty. Psychiatric this patient is able to make decisions and demonstrates good insight into disease process. Alert and Oriented x 3. pleasant and cooperative. General Notes: Upon inspection patient's wound bed actually showed signs of good granulation and epithelization at this point in some areas although there was [name] lot of callus buildup and [name] lot of erythema noted around the edges of the wound as well. I discussed with the patient that I do believe this is likely representative of infection and I will go ahead and see what I can do. I did perform [name] fairly extensive debridement to clear away as much of the callus is possible she tolerated that today without complication and postdebridement the wound bed appears to be doing better from that standpoint. Still there are some signs of erythema here and again I did obtain [name] wound culture that we can send for PCR also did go ahead and tell her that I would put her on an antibiotic as well. Integumentary (Hair, Skin) Wound #  12R  status is Open. Original cause of wound was Pressure Injury. The date acquired was: 03/16/2020. The wound has been in treatment 81 weeks. The wound is located on the Left,Medial,Plantar Foot. The wound measures 0.4cm length x 0.5cm width x 0.2cm depth; 0.157cm^2 area and 0.031cm^3 volume. There is Fat Layer (Subcutaneous Tissue) exposed. There is [name] medium amount of serosanguineous drainage noted. The wound margin is flat and intact. There is no granulation within the wound bed. There is no necrotic tissue within the wound bed. 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 There was [name] Excisional Skin/Subcutaneous Tissue Debridement with [name] total area of 4.24 sq cm performed by [name], PA-C. With the following instrument(s): Curette to remove Viable and Non-Viable tissue/material. Material removed includes Callus, Subcutaneous Tissue, and Slough. [name] time out was conducted at 09:43, prior to the start of the procedure. [name] Minimum amount of bleeding was controlled with Pressure. The procedure was tolerated well. Post Debridement Measurements: 2.7cm length x 2cm width x 0.2cm depth; 0.848cm^3 volume. Character of Wound/Ulcer Post Debridement is stable. Severity of Tissue Post Debridement is: Fat layer exposed. Post procedure Diagnosis Wound #12R: Same as Pre-Procedure Plan Follow-up Appointments: Return Appointment in 1 week. Bathing/  Shower/ Hygiene: May shower; gently cleanse wound with antibacterial soap, rinse and pat dry prior to dressing wounds No tub bath. Off-Loading: Open toe surgical shoe - right foot Other: - front offloading boot left foot Medications-Please add to medication list.: P.O. Antibiotics - start Cipro as directed Laboratory ordered were: Wound culture routine - wound culture, Vikor The following medication(s) was prescribed: Cipro oral 500 mg tablet 1 1 tablet oral taken 3 times per week after completion of dialysis on Tuesday, Thursday, and Saturday. Do not take Trazadone while on this starting 10/09/2023 WOUND #12R: - Foot Wound Laterality: Plantar, Left, Medial Cleanser: Vashe 5.8 (oz) Every Other Day/30 Days Discharge Instructions: Use vashe 5.8 (oz) as directed Topical: Mupirocin Ointment Every Other Day/30 Days Discharge Instructions: Apply as directed by provider. Prim Dressing: Hydrofera Blue Ready Transfer Foam, 2.5x2.5 (in/in) Every Other Day/30 Days ary Discharge Instructions: Apply Hydrofera Blue Ready to wound bed as directed Secondary Dressing: Gauze Every Other Day/30 Days Discharge Instructions: As directed: dry, moistened with saline or moistened with Dakins Solution Secured With: Medipore T - 51M Medipore H Soft Cloth Surgical T ape ape, 2x2 (in/yd) Every Other Day/30 Days Secured With: [employer] Sterile or Non-Sterile 6-ply 4.5x4 (yd/yd) Every Other Day/30 Days Discharge Instructions: Apply Kerlix as directed Secured With: [employer] Food Dressing, Latex-free, Size 5, Small-Head / Shoulder / Thigh Every Other Day/30 Days 1. Based on what I am seeing I do believe that the patient would be continuing with dressings for [name] wound care standpoint I would recommend Hydrofera Blue. 2. I am good recommend as well she continue with her offloading boot. 3. I am also going to recommend at this point that we have the patient go ahead and use roll gauze to secure in place along with an ABD  pad and then  stretch net to secure. 4. I am also going to go ahead and send in [name] prescription for Cipro for her she will be taking this after dialysis on the days that she has dialysis. This will be on Tuesday, Thursday, and Saturday. That is can be for 2 weeks. We will see patient back for reevaluation in 1 week here in the clinic. If anything worsens or changes patient will contact our office for additional recommendations. Electronic Signature(s) Signed: 10/09/2023 10:04:40 AM By: [name] PA-C Entered By: [name] on 10/09/2023 10:04:39 -------------------------------------------------------------------------------- SuperBill Details Patient Name: Date of Service: Heather Jefferson [name]. 10/09/2023 Medical Record Number: [id number] Patient Account Number: [id number] Date of Birth/Sex: Treating RN: 06/09/1976 (47 y.o. [name] Primary Care Provider: [name] Other Clinician: [name] Referring Provider: Treating Provider/Extender: [name] Weeks in Treatment: [address], Adelae [name] ([id number]) [id number] Page 22 of 22 Diagnosis Coding ICD-10 Codes Code Description E11.621 Type 2 diabetes mellitus with foot ulcer L97.528 Non-pressure chronic ulcer of other part of left foot with other specified severity E11.42 Type 2 diabetes mellitus with diabetic polyneuropathy Z79.4 Long term (current) use of insulin N18.6 End stage renal disease Z99.2 Dependence on renal dialysis L84 Corns and callosities Facility Procedures : CPT4 Code: [id number] Description: 11042 - DEB SUBQ TISSUE 20 SQ CM/< ICD-10 Diagnosis Description L97.528 Non-pressure chronic ulcer of other part of left foot with other specified seve Modifier: rity Quantity: 1 Physician Procedures : CPT4 Code Description Modifier [id number] - WC PHYS LEVEL 4 - EST PT 25 ICD-10 Diagnosis Description E11.621 Type 2 diabetes mellitus with foot ulcer L97.528  Non-pressure chronic ulcer of other part of left foot with other specified severity E11.42  Type 2 diabetes mellitus with diabetic polyneuropathy Z79.4 Long term (current) use of insulin Quantity: 1 : 11042 11042 - WC PHYS SUBQ TISS 20 SQ CM ICD-10 Diagnosis Description L97.528 Non-pressure chronic ulcer of other part of left foot with other specified severity Quantity: 1 Electronic Signature(s) Signed: 10/09/2023 10:04:56 AM By: [name] PA-C Entered By: [name] on 10/09/2023 10:04:55

## 2023-10-09 NOTE — Progress Notes (Addendum)
[name], [name] ([id number]) [id number] Page 1 of 8 Visit Report for 10/09/2023 Arrival Information Details Patient Name: Date of Service: [name] 10/09/2023 9:00 [name]: [id number] Patient Account Number: [id number] Date of Birth/Sex: Treating RN: [birth year] (47 y.o. [name] Primary Care Marke Goodwyn: [name] Other Clinician: [name] Referring Shandrea Lusk: Treating Graylyn Bunney/Extender: [name] in Treatment: 56 Visit Information History Since Last Visit All ordered tests and consults were completed: No Patient Arrived: Wheel Chair Added or deleted any medications: No Arrival Time: 09:11 Any new allergies or adverse reactions: No Transfer Assistance: None Had [name] fall or experienced change in No Patient Identification Verified: Yes activities of daily living that may affect Secondary Verification Process Completed: Yes risk of falls: Patient Requires Transmission-Based Precautions: No Signs or symptoms of abuse/neglect since last visito No Patient Has Alerts: No Hospitalized since last visit: No Implantable device outside of the clinic excluding No cellular tissue based products placed in the center since last visit: Has Dressing in Place as Prescribed: Yes Has Footwear/Offloading in Place as Prescribed: Yes Left: Other:offloading boot Pain Present Now: No Electronic Signature(s) Signed: 10/09/2023 2:04:51 PM By: [name] Entered By: [name] on 10/09/2023 09:13:27 -------------------------------------------------------------------------------- Clinic Level of Care Assessment Details Patient Name: Date of Service: [name] 10/09/2023 9:00 [name]: [id number] Patient Account Number: [id number] Date of Birth/Sex: Treating RN: 1976-01-28 (47 y.o. [name] Primary Care Gagandeep Kossman: [name] Other Clinician: [name] Referring  Kayode Petion: Treating Shir Bergman/Extender: [name] in Treatment: 67 Clinic Level of Care Assessment Items TOOL 1 Quantity Score []  - 0 Use when EandM and Procedure is performed on INITIAL visit ASSESSMENTS - Nursing Assessment / Reassessment []  - 0 General Physical Exam (combine w/ comprehensive assessment (listed just below) when performed on new pt. evalsBERNEDETTE, [name] ([id number]) [id number] Page 2 of 8 []  - 0 Comprehensive Assessment (HX, ROS, Risk Assessments, Wounds Hx, etc.) ASSESSMENTS - Wound and Skin Assessment / Reassessment []  - 0 Dermatologic / Skin Assessment (not related to wound area) ASSESSMENTS - Ostomy and/or Continence Assessment and Care []  - 0 Incontinence Assessment and Management []  - 0 Ostomy Care Assessment and Management (repouching, etc.) PROCESS - Coordination of Care []  - 0 Simple Patient / Family Education for ongoing care []  - 0 Complex (extensive) Patient / Family Education for ongoing care []  - 0 Staff obtains Chiropractor, Records, T Results / Process Orders est []  - 0 Staff telephones HHA, Nursing Homes / Clarify orders / etc []  - 0 Routine Transfer to another Facility (non-emergent condition) []  - 0 Routine Hospital Admission (non-emergent condition) []  - 0 New Admissions / Manufacturing engineer / Ordering NPWT Apligraf, etc. , []  - 0 Emergency Hospital Admission (emergent condition) PROCESS - Special Needs []  - 0 Pediatric / Minor Patient Management []  - 0 Isolation Patient Management []  - 0 Hearing / Language / Visual special needs []  - 0 Assessment of Community assistance (transportation, D/C planning, etc.) []  - 0 Additional assistance / Altered mentation []  - 0 Support Surface(s) Assessment (bed, cushion, seat, etc.) INTERVENTIONS - Miscellaneous []  - 0 External ear exam []  - 0 Patient Transfer (multiple staff / Nurse, adult / Similar devices) []  - 0 Simple Staple /  Suture removal (25 or less) []  - 0 Complex Staple / Suture removal (26 or more) []  - 0 Hypo/Hyperglycemic Management (do not check if billed separately) []  - 0 Ankle / Brachial  Index (ABI) - do not check if billed separately Has the patient been seen at the hospital within the last three years: Yes Total Score: 0 Level Of Care: ____ Electronic Signature(s) Signed: 10/09/2023 2:04:51 PM By: [name] Entered By: [name] on 10/09/2023 09:52:05 -------------------------------------------------------------------------------- Encounter Discharge Information Details Patient Name: Date of Service: [name], [name]. 10/09/2023 9:00 [name]: [id number] Patient Account Number: [id number] Date of Birth/Sex: Treating RN: Dec 25, 1975 (47 y.o. Afton, Garvie, Dazhane [name] ([id number]) [id number] Page 3 of 8 Primary Care [name]: [name] Other Clinician: [name] Referring Jorgina Binning: Treating Kishawn Pickar/Extender: [name] in Treatment: 26 Encounter Discharge Information Items Post Procedure Vitals Discharge Condition: Stable Temperature (F): 98.6 Ambulatory Status: Wheelchair Pulse (bpm): 80 Discharge Destination: Home Respiratory Rate (breaths/min): 18 Transportation: Private Auto Blood Pressure (mmHg): 93/54 Accompanied By: stepdad Schedule Follow-up Appointment: Yes Clinical Summary of Care: Electronic Signature(s) Signed: 10/09/2023 2:04:51 PM By: [name] Entered By: [name] on 10/09/2023 11:21:49 -------------------------------------------------------------------------------- Lower Extremity Assessment Details Patient Name: Date of Service: [name] 10/09/2023 9:00 [name]: [id number] Patient Account Number: [id number] Date of Birth/Sex: Treating RN: 1976/02/03 (47 y.o. [name] Primary Care Huntleigh Doolen: [name] Other Clinician:  [name] Referring Uma Jerde: Treating Aleathea Pugmire/Extender: [name] Weeks in Treatment: 77 Electronic Signature(s) Signed: 10/09/2023 2:04:51 PM By: [name] Signed: 10/09/2023 3:34:19 PM By: Yevonne Pax RN Entered By: [name] on 10/09/2023 09:21:44 -------------------------------------------------------------------------------- Multi Wound Chart Details Patient Name: Date of Service: [name], [name]. 10/09/2023 9:00 [name]: [id number] Patient Account Number: [id number] Date of Birth/Sex: Treating RN: 1976/06/24 (47 y.o. [name] Primary Care Abdirahim Flavell: [name] Other Clinician: [name] Referring Phoenix Riesen: Treating Orion Vandervort/Extender: [name] in Treatment: 80 Vital Signs Height(in): 69 Pulse(bpm): 80 Weight(lbs): 178 Blood Pressure(mmHg): 93/54 Body Mass Index(BMI): 26.3 Temperature(F): 98.6 Respiratory Rate(breaths/min): 18 Billiter, Iyahna [name] ([id number]) [id number] Page 4 of 8 [12R:Photos:] [N/[name]:N/[name]] Left, Medial, Plantar Foot N/[name] N/[name] Wound Location: Pressure Injury N/[name] N/[name] Wounding Event: Diabetic Wound/Ulcer of the Lower N/[name] N/[name] Primary Etiology: Extremity Chronic sinus problems/congestion, N/[name] N/[name] Comorbid History: Middle ear problems, Anemia, Chronic Obstructive Pulmonary Disease (COPD), Congestive Heart Failure, Type II Diabetes, End Stage Renal Disease, History of pressure wounds, Neuropathy 03/16/2020 N/[name] N/[name] Date Acquired: 106 N/[name] N/[name] Weeks of Treatment: Open N/[name] N/[name] Wound Status: Yes N/[name] N/[name] Wound Recurrence: 0.4x0.5x0.2 N/[name] N/[name] Measurements L x W x D (cm) 0.157 N/[name] N/[name] [name] (cm) : rea 0.031 N/[name] N/[name] Volume (cm) : 96.70% N/[name] N/[name] % Reduction in [name] rea: 96.70% N/[name] N/[name] % Reduction in Volume: Grade 3 N/[name] N/[name] Classification: Medium N/[name] N/[name] Exudate [name] mount: Serosanguineous N/[name] N/[name] Exudate Type: red, brown N/[name] N/[name] Exudate  Color: Flat and Intact N/[name] N/[name] Wound Margin: None Present (0%) N/[name] N/[name] Granulation [name] mount: None Present (0%) N/[name] N/[name] Necrotic [name] mount: Fat Layer (Subcutaneous Tissue): Yes N/[name] N/[name] Exposed Structures: Fascia: No Tendon: No Muscle: No Joint: No Bone: No Medium (34-66%) N/[name] N/[name] Epithelialization: Treatment Notes Electronic Signature(s) Signed: 10/09/2023 2:04:51 PM By: [name] Entered By: [name] on 10/09/2023 09:21:49 -------------------------------------------------------------------------------- Multi-Disciplinary Care Plan Details Patient Name: Date of Service: [name] 10/09/2023 9:00 [name]: [id number] Patient Account Number: [id number] Date of Birth/Sex: Treating RN: November 17, 1975 (47 y.o. [name] Primary Care Taija Mathias: [name] Other Clinician: [name] Referring Kirrah Mustin: Treating Tykira Wachs/Extender: Norman Clay,  Georgiann Hahn in Treatment: [address] SAFFIYAH, BRACKER [name] ([id number]) [id number] Page 5 of 8 Abuse / Safety / Falls / Self Care Management Nursing Diagnoses: History of Falls Potential for falls Potential for injury related to falls Goals: Patient will not develop complications from immobility Date Initiated: 03/17/2022 Date Inactivated: 04/20/2022 Target Resolution Date: 03/16/2022 Goal Status: Met Patient/caregiver will verbalize understanding of skin care regimen Date Initiated: 03/17/2022 Date Inactivated: 09/28/2022 Target Resolution Date: 03/16/2022 Goal Status: Met Patient/caregiver will verbalize/demonstrate measure taken to improve self care Date Initiated: 03/17/2022 Target Resolution Date: 06/21/2023 Goal Status: Active Interventions: Assess fall risk on admission and as needed Provide education on basic hygiene Provide education on personal and home safety Notes: Electronic Signature(s) Signed: 10/09/2023 2:04:51 PM By: [name] Signed: 10/09/2023 3:34:19 PM By: Yevonne Pax RN Entered By: [name] on 10/09/2023 10:01:15 -------------------------------------------------------------------------------- Pain Assessment Details Patient Name: Date of Service: [name] 10/09/2023 9:00 [name]: [id number] Patient Account Number: [id number] Date of Birth/Sex: Treating RN: June 12, 1976 (47 y.o. [name] Primary Care Naarah Borgerding: [name] Other Clinician: [name] Referring Luisfelipe Engelstad: Treating Sheldon Sem/Extender: [name] in Treatment: 17 Active Problems Location of Pain Severity and Description of Pain Patient Has Paino No Site Locations [place] [name] ([id number]) [id number] Page 6 of 8 Pain Management and Medication Current Pain Management: Electronic Signature(s) Signed: 10/09/2023 2:04:51 PM By: [name] Signed: 10/09/2023 3:34:19 PM By: Yevonne Pax RN Entered By: [name] on 10/09/2023 09:17:09 -------------------------------------------------------------------------------- Patient/Caregiver Education Details Patient Name: Date of Service: [name] 12/16/2024andnbsp9:00 [name]: [id number] Patient Account Number: [id number] Date of Birth/Gender: Treating RN: [birth year] (47 y.o. [name] Primary Care Physician: [name] Other Clinician: [name] Referring Physician: Treating Physician/Extender: [name] in Treatment: 38 Education Assessment Education Provided To: Patient Education Topics Provided Wound/Skin Impairment: Handouts: Other: continue wound care as directed Methods: Explain/Verbal Responses: State content correctly Electronic Signature(s) Signed: 10/09/2023 2:04:51 PM By: [name] Entered By: [name] on 10/09/2023 10:01:32 [name] ([id number]) [id number]  Page 7 of 8 -------------------------------------------------------------------------------- Wound Assessment Details Patient Name: Date of Service: AVEY, SCOLARO 10/09/2023 9:00 [name]: [id number] Patient Account Number: [id number] Date of Birth/Sex: Treating RN: 03-02-1976 (47 y.o. [name] Primary Care Verline Kong: [name] Other Clinician: [name] Referring Bahja Bence: Treating Derrek Puff/Extender: [name] Weeks in Treatment: 81 Wound Status Wound Number: 12R Primary Diabetic Wound/Ulcer of the Lower Extremity Etiology: Wound Location: Left, Medial, Plantar Foot Wound Open Wounding Event: Pressure Injury Status: Date Acquired: 03/16/2020 Comorbid Chronic sinus problems/congestion, Middle ear problems, Anemia, Weeks Of Treatment: 81 History: Chronic Obstructive Pulmonary Disease (COPD), Congestive Heart Clustered Wound: No Failure, Type II Diabetes, End Stage Renal Disease, History of pressure wounds, Neuropathy Photos Wound Measurements Length: (cm) 0.4 Width: (cm) 0.5 Depth: (cm) 0.2 Area: (cm) 0.157 Volume: (cm) 0.031 % Reduction in Area: 96.7% % Reduction in Volume: 96.7% Epithelialization: Medium (34-66%) Wound Description Classification: Grade 3 Wound Margin: Flat and Intact Exudate Amount: Medium Exudate Type: Serosanguineous Exudate Color: red, brown Foul Odor After Cleansing: No Slough/Fibrino Yes Wound Bed Granulation Amount: None Present (0%) Exposed Structure Necrotic Amount: None Present (0%) Fascia Exposed: No Fat Layer (Subcutaneous Tissue) Exposed: Yes Tendon Exposed: No Muscle Exposed: No Joint Exposed: No Bone Exposed: No Treatment Notes Wound #12R (Foot) Wound Laterality: Plantar, Left, Medial Cleanser Vashe 5.8 (oz) Discharge Instruction: Use vashe 5.8 (  oz) as directed TIANCA, ZAMMIT [name] ([id number]) [id number] Page 8 of 8 Peri-Wound Care Topical Mupirocin  Ointment Discharge Instruction: Apply as directed by Iridiana Fonner. Primary Dressing Hydrofera Blue Ready Transfer Foam, 2.5x2.5 (in/in) Discharge Instruction: Apply Hydrofera Blue Ready to wound bed as directed Secondary Dressing Gauze Discharge Instruction: As directed: dry, moistened with saline or moistened with Dakins Solution Secured With Medipore T - 53M Medipore H Soft Cloth Surgical T ape ape, 2x2 (in/yd) Kerlix Roll Sterile or Non-Sterile 6-ply 4.5x4 (yd/yd) Discharge Instruction: Apply Kerlix as directed Stretch Net Dressing, Latex-free, Size 5, Small-Head / Shoulder / Thigh Compression Wrap Compression Stockings Add-Ons Electronic Signature(s) Signed: 10/09/2023 2:04:51 PM By: [name] Signed: 10/09/2023 3:34:19 PM By: Yevonne Pax RN Entered By: [name] on 10/09/2023 09:21:31 -------------------------------------------------------------------------------- Vitals Details Patient Name: Date of Service: [name], Brayley [name]. 10/09/2023 9:00 [name]: [id number] Patient Account Number: [id number] Date of Birth/Sex: Treating RN: 26-Sep-1976 (47 y.o. [name] Primary Care Lazar Tierce: [name] Other Clinician: [name] Referring Casmer Yepiz: Treating Aki Burdin/Extender: [name] in Treatment: 18 Vital Signs Time Taken: 09:13 Temperature (F): 98.6 Height (in): 69 Pulse (bpm): 80 Weight (lbs): 178 Respiratory Rate (breaths/min): 18 Body Mass Index (BMI): 26.3 Blood Pressure (mmHg): 93/54 Reference Range: 80 - 120 mg / dl Electronic Signature(s) Signed: 10/09/2023 2:04:51 PM By: [name] Entered By: [name] on 10/09/2023 09:17:04

## 2023-10-16 ENCOUNTER — Ambulatory Visit: Payer: MEDICAID | Admitting: Physician Assistant

## 2023-10-17 ENCOUNTER — Encounter: Payer: MEDICAID | Admitting: Physician Assistant

## 2023-10-17 DIAGNOSIS — E11621 Type 2 diabetes mellitus with foot ulcer: Secondary | ICD-10-CM | POA: Diagnosis not present

## 2023-10-17 NOTE — Progress Notes (Signed)
[name], [name] ([id number]) [id number] Page 1 of 8 Visit Report for 10/17/2023 Arrival Information Details Patient Name: Date of Service: [name], [name] 10/17/2023 11:00 [name]: [id number] Patient Account Number: [id number] Date of Birth/Sex: Treating RN: 04/20/1976 (47 y.o. Ginette Pitman Primary Care Nezzie Manera: [name] Other Clinician: [name] Referring Cecillia Menees: Treating Brenisha Tsui/Extender: [name] in Treatment: 38 Visit Information History Since Last Visit All ordered tests and consults were completed: No Patient Arrived: Wheel Chair Added or deleted any medications: No Arrival Time: 11:28 Any new allergies or adverse reactions: No Transfer Assistance: None Had [name] fall or experienced change in No Patient Identification Verified: Yes activities of daily living that may affect Secondary Verification Process Completed: Yes risk of falls: Patient Requires Transmission-Based Precautions: No Signs or symptoms of abuse/neglect since last visito No Patient Has Alerts: No Hospitalized since last visit: No Implantable device outside of the clinic excluding No cellular tissue based products placed in the center since last visit: Has Dressing in Place as Prescribed: Yes Has Footwear/Offloading in Place as Prescribed: Yes Left: Other:front offloader boot Pain Present Now: No Electronic Signature(s) Signed: 10/17/2023 12:22:29 PM By: [name] Entered By: [name] on 10/17/2023 08:30:10 -------------------------------------------------------------------------------- Clinic Level of Care Assessment Details Patient Name: Date of Service: [name], [name] 10/17/2023 11:00 [name]: [id number] Patient Account Number: [id number] Date of Birth/Sex: Treating RN: 1975-12-07 (48 y.o. Ginette Pitman Primary Care Areal Cochrane: [name] Other Clinician: [name] Referring Shaunee Mulkern: Treating Quaneshia Wareing/Extender: [name] in Treatment: 82 Clinic Level of Care Assessment Items TOOL 1 Quantity Score []  - 0 Use when EandM and Procedure is performed on INITIAL visit ASSESSMENTS - Nursing Assessment / Reassessment []  - 0 General Physical Exam (combine w/ comprehensive assessment (listed just below) when performed on new pt. evalsKARIYAH, CREST [name] ([id number]) [id number] Page 2 of 8 []  - 0 Comprehensive Assessment (HX, ROS, Risk Assessments, Wounds Hx, etc.) ASSESSMENTS - Wound and Skin Assessment / Reassessment []  - 0 Dermatologic / Skin Assessment (not related to wound area) ASSESSMENTS - Ostomy and/or Continence Assessment and Care []  - 0 Incontinence Assessment and Management []  - 0 Ostomy Care Assessment and Management (repouching, etc.) PROCESS - Coordination of Care []  - 0 Simple Patient / Family Education for ongoing care []  - 0 Complex (extensive) Patient / Family Education for ongoing care []  - 0 Staff obtains Chiropractor, Records, T Results / Process Orders est []  - 0 Staff telephones HHA, Nursing Homes / Clarify orders / etc []  - 0 Routine Transfer to another Facility (non-emergent condition) []  - 0 Routine Hospital Admission (non-emergent condition) []  - 0 New Admissions / Manufacturing engineer / Ordering NPWT Apligraf, etc. , []  - 0 Emergency Hospital Admission (emergent condition) PROCESS - Special Needs []  - 0 Pediatric / Minor Patient Management []  - 0 Isolation Patient Management []  - 0 Hearing / Language / Visual special needs []  - 0 Assessment of Community assistance (transportation, D/C planning, etc.) []  - 0 Additional assistance / Altered mentation []  - 0 Support Surface(s) Assessment (bed, cushion, seat, etc.) INTERVENTIONS - Miscellaneous []  - 0 External ear exam []  - 0 Patient Transfer (multiple staff / Nurse, adult / Similar devices) []  -  0 Simple Staple / Suture removal (25 or less) []  - 0 Complex Staple / Suture removal (26 or more) []  - 0 Hypo/Hyperglycemic Management (do not check if billed separately) []  - 0 Ankle /  Brachial Index (ABI) - do not check if billed separately Has the patient been seen at the hospital within the last three years: Yes Total Score: 0 Level Of Care: ____ Electronic Signature(s) Signed: 10/17/2023 12:22:29 PM By: [name] Entered By: [name] on 10/17/2023 09:17:18 -------------------------------------------------------------------------------- Encounter Discharge Information Details Patient Name: Date of Service: Heather Jefferson [name]. 10/17/2023 11:00 [name]: [id number] Patient Account Number: [id number] Date of Birth/Sex: Treating RN: Dec 25, 1975 (47 y.o. [name], [name], [name] ([id number]) [id number] Page 3 of 8 Primary Care Malinda Mayden: [name] Other Clinician: [name] Referring Kamica Florance: Treating Petra Sargeant/Extender: [name] in Treatment: 34 Encounter Discharge Information Items Post Procedure Vitals Discharge Condition: Stable Temperature (F): 98.8 Ambulatory Status: Wheelchair Pulse (bpm): 72 Discharge Destination: Home Respiratory Rate (breaths/min): 16 Transportation: Private Auto Blood Pressure (mmHg): 93/63 Accompanied By: mother Schedule Follow-up Appointment: Yes Clinical Summary of Care: Electronic Signature(s) Signed: 10/17/2023 12:22:29 PM By: [name] Entered By: [name] on 10/17/2023 09:22:09 -------------------------------------------------------------------------------- Lower Extremity Assessment Details Patient Name: Date of Service: [name], [name] 10/17/2023 11:00 [name]: [id number] Patient Account Number: [id number] Date of Birth/Sex: Treating RN: 03/22/1976 (47 y.o. Ginette Pitman Primary Care Izyan Ezzell: [name] Other Clinician: [name] Referring Nacole Fluhr: Treating Arjay Jaskiewicz/Extender: [name] Weeks in Treatment: 54 Electronic Signature(s) Signed: 10/17/2023 12:22:29 PM By: [name] Signed: 10/19/2023 4:44:21 PM By: Midge Aver MSN RN CNS WTA Entered By: [name] on 10/17/2023 08:56:08 -------------------------------------------------------------------------------- Multi Wound Chart Details Patient Name: Date of Service: Heather Jefferson [name]. 10/17/2023 11:00 [name]: [id number] Patient Account Number: [id number] Date of Birth/Sex: Treating RN: 28-Apr-1976 (47 y.o. Ginette Pitman Primary Care Sumayyah Custodio: [name] Other Clinician: [name] Referring Sonia Stickels: Treating Baby Stairs/Extender: [name] Weeks in Treatment: 59 Vital Signs Height(in): 69 Pulse(bpm): 72 Weight(lbs): 178 Blood Pressure(mmHg): 93/63 Body Mass Index(BMI): 26.3 Temperature(F): 98.8 Respiratory Rate(breaths/min): 16 Hawn, Temitope [name] ([id number]) [id number] Page 4 of 8 [12R:Photos:] [N/[name]:N/[name]] Left, Medial, Plantar Foot N/[name] N/[name] Wound Location: Pressure Injury N/[name] N/[name] Wounding Event: Diabetic Wound/Ulcer of the Lower N/[name] N/[name] Primary Etiology: Extremity Chronic sinus problems/congestion, N/[name] N/[name] Comorbid History: Middle ear problems, Anemia, Chronic Obstructive Pulmonary Disease (COPD), Congestive Heart Failure, Type II Diabetes, End Stage Renal Disease, History of pressure wounds, Neuropathy 03/16/2020 N/[name] N/[name] Date Acquired: 61 N/[name] N/[name] Weeks of Treatment: Open N/[name] N/[name] Wound Status: Yes N/[name] N/[name] Wound Recurrence: 1.2x0.7x0.2 N/[name] N/[name] Measurements L x W x D (cm) 0.66 N/[name] N/[name] [name] (cm) : rea 0.132 N/[name] N/[name] Volume (cm) : 86.00% N/[name] N/[name] % Reduction in [name] rea: 86.00% N/[name] N/[name] % Reduction in Volume: Grade 3 N/[name] N/[name] Classification: Medium N/[name] N/[name] Exudate [name] mount: Serosanguineous N/[name] N/[name] Exudate  Type: red, brown N/[name] N/[name] Exudate Color: Flat and Intact N/[name] N/[name] Wound Margin: None Present (0%) N/[name] N/[name] Granulation [name] mount: None Present (0%) N/[name] N/[name] Necrotic [name] mount: Fat Layer (Subcutaneous Tissue): Yes N/[name] N/[name] Exposed Structures: Fascia: No Tendon: No Muscle: No Joint: No Bone: No Medium (34-66%) N/[name] N/[name] Epithelialization: Treatment Notes Electronic Signature(s) Signed: 10/17/2023 12:22:29 PM By: [name] Entered By: [name] on 10/17/2023 08:56:18 -------------------------------------------------------------------------------- Multi-Disciplinary Care Plan Details Patient Name: Date of Service: Heather Jefferson [name]. 10/17/2023 11:00 [name]: [id number] Patient Account Number: [id number] Date of Birth/Sex: Treating RN: [birth year] (47 y.o. Ginette Pitman Primary Care Davis Vannatter: [name] Other Clinician: [name] Referring Mason Dibiasio: Treating  Atif Chapple/Extender: [name] Weeks in Treatment: [address] TIAIRRA, COONRADT [name] ([id number]) [id number] Page 5 of 8 Abuse / Safety / Falls / Self Care Management Nursing Diagnoses: History of Falls Potential for falls Potential for injury related to falls Goals: Patient will not develop complications from immobility Date Initiated: 03/17/2022 Date Inactivated: 04/20/2022 Target Resolution Date: 03/16/2022 Goal Status: Met Patient/caregiver will verbalize understanding of skin care regimen Date Initiated: 03/17/2022 Date Inactivated: 09/28/2022 Target Resolution Date: 03/16/2022 Goal Status: Met Patient/caregiver will verbalize/demonstrate measure taken to improve self care Date Initiated: 03/17/2022 Target Resolution Date: 06/21/2023 Goal Status: Active Interventions: Assess fall risk on admission and as needed Provide education on basic hygiene Provide education on personal and home safety Notes: Electronic Signature(s) Signed: 10/17/2023  12:22:29 PM By: [name] Signed: 10/19/2023 4:44:21 PM By: Midge Aver MSN RN CNS WTA Entered By: [name] on 10/17/2023 09:20:48 -------------------------------------------------------------------------------- Pain Assessment Details Patient Name: Date of Service: Heather Jefferson [name]. 10/17/2023 11:00 [name]: [id number] Patient Account Number: [id number] Date of Birth/Sex: Treating RN: 1976-06-30 (47 y.o. Ginette Pitman Primary Care Diantha Paxson: [name] Other Clinician: [name] Referring Aram Domzalski: Treating Jaycelynn Knickerbocker/Extender: [name] Weeks in Treatment: 38 Active Problems Location of Pain Severity and Description of Pain Patient Has Paino No Site Locations [place], [place] [name] ([id number]) [id number] Page 6 of 8 Pain Management and Medication Current Pain Management: Electronic Signature(s) Signed: 10/17/2023 11:55:55 AM By: Midge Aver MSN RN CNS WTA Signed: 10/17/2023 12:22:29 PM By: [name] Entered By: [name] on 10/17/2023 08:34:18 -------------------------------------------------------------------------------- Patient/Caregiver Education Details Patient Name: Date of Service: [name] 12/24/2024andnbsp11:00 [name]: [id number] Patient Account Number: [id number] Date of Birth/Gender: Treating RN: Jun 19, 1976 (47 y.o. Ginette Pitman Primary Care Physician: [name] Other Clinician: [name] Referring Physician: Treating Physician/Extender: [name] in Treatment: 94 Education Assessment Education Provided To: Patient Education Topics Provided Wound/Skin Impairment: Handouts: Other: continue wound care as directed Methods: Explain/Verbal Responses: State content correctly Electronic Signature(s) Signed: 10/17/2023 12:22:29 PM By: [name] Entered By: [name] on 10/17/2023 09:21:07 Heather Phoenix  [name] ([id number]) [id number] Page 7 of 8 -------------------------------------------------------------------------------- Wound Assessment Details Patient Name: Date of Service: [name], [name] 10/17/2023 11:00 [name]: [id number] Patient Account Number: [id number] Date of Birth/Sex: Treating RN: [birth year] (47 y.o. Ginette Pitman Primary Care Mayetta Castleman: [name] Other Clinician: [name] Referring Chinaza Rooke: Treating Sora Olivo/Extender: [name] Weeks in Treatment: 82 Wound Status Wound Number: 12R Primary Diabetic Wound/Ulcer of the Lower Extremity Etiology: Wound Location: Left, Medial, Plantar Foot Wound Open Wounding Event: Pressure Injury Status: Date Acquired: 03/16/2020 Comorbid Chronic sinus problems/congestion, Middle ear problems, Anemia, Weeks Of Treatment: 82 History: Chronic Obstructive Pulmonary Disease (COPD), Congestive Heart Clustered Wound: No Failure, Type II Diabetes, End Stage Renal Disease, History of pressure wounds, Neuropathy Photos Wound Measurements Length: (cm) 1.2 Width: (cm) 0.7 Depth: (cm) 0.2 Area: (cm) 0.66 Volume: (cm) 0.132 % Reduction in Area: 86% % Reduction in Volume: 86% Epithelialization: Medium (34-66%) Wound Description Classification: Grade 3 Wound Margin: Flat and Intact Exudate Amount: Medium Exudate Type: Serosanguineous Exudate Color: red, brown Foul Odor After Cleansing: No Slough/Fibrino Yes Wound Bed Granulation Amount: None Present (0%) Exposed Structure Necrotic Amount: None Present (0%) Fascia Exposed: No Fat Layer (Subcutaneous Tissue) Exposed: Yes Tendon Exposed: No Muscle Exposed: No Joint Exposed: No Bone Exposed: No Treatment Notes Wound #12R (Foot) Wound Laterality: Plantar, Left,  Medial Cleanser Vashe 5.8 (oz) Discharge Instruction: Use vashe 5.8 (oz) as directed [name], [name] ([id number]) [id number]  Page 8 of 8 Heather-Wound Care Topical Mupirocin Ointment Discharge Instruction: Apply as directed by Cole Klugh. Primary Dressing Hydrofera Blue Ready Transfer Foam, 2.5x2.5 (in/in) Discharge Instruction: Apply Hydrofera Blue Ready to wound bed as directed Secondary Dressing Gauze Discharge Instruction: As directed: dry, moistened with saline or moistened with Dakins Solution Secured With Medipore T - 18M Medipore H Soft Cloth Surgical T ape ape, 2x2 (in/yd) Kerlix Roll Sterile or Non-Sterile 6-ply 4.5x4 (yd/yd) Discharge Instruction: Apply Kerlix as directed Stretch Net Dressing, Latex-free, Size 5, Small-Head / Shoulder / Thigh Compression Wrap Compression Stockings Add-Ons Electronic Signature(s) Signed: 10/17/2023 11:55:55 AM By: Midge Aver MSN RN CNS WTA Signed: 10/17/2023 12:22:29 PM By: [name] Entered By: [name] on 10/17/2023 08:40:05 -------------------------------------------------------------------------------- Vitals Details Patient Name: Date of Service: [name], [name] 10/17/2023 11:00 [name]: [id number] Patient Account Number: [id number] Date of Birth/Sex: Treating RN: 1976-08-14 (47 y.o. Ginette Pitman Primary Care Kare Dado: [name] Other Clinician: [name] Referring Adja Ruff: Treating Adenike Shidler/Extender: [name] in Treatment: 52 Vital Signs Time Taken: 11:33 Temperature (F): 98.8 Height (in): 69 Pulse (bpm): 72 Weight (lbs): 178 Respiratory Rate (breaths/min): 16 Body Mass Index (BMI): 26.3 Blood Pressure (mmHg): 93/63 Reference Range: 80 - 120 mg / dl Electronic Signature(s) Signed: 10/17/2023 12:22:29 PM By: [name] Entered By: [name] on 10/17/2023 08:34:13

## 2023-10-17 NOTE — Progress Notes (Signed)
LAURN, DEMBSKI A ([id number]) [id number] Page 1 of 22 Visit Report for 10/17/2023 Chief Complaint Document Details Patient Name: Date of Service: AARIONA, URSINI 10/17/2023 11:00 A M Medical Record Number: [id number] Patient Account Number: [id number] Date of Birth/Sex: Treating RN: November 28, 1975 (47 y.o. Ginette Pitman Primary Care Provider: [name] Other Clinician: Referring Provider: Treating Provider/Extender: [name] Weeks in Treatment: 49 Information Obtained from: Patient Chief Complaint Left foot ulcer Electronic Signature(s) Signed: 10/17/2023 11:14:05 AM By: [name] PA-C Entered By: [name] on 10/17/2023 08:14:05 -------------------------------------------------------------------------------- Debridement Details Patient Name: Date of Service: [name] 10/17/2023 11:00 A M Medical Record Number: [id number] Patient Account Number: [id number] Date of Birth/Sex: Treating RN: 17-May-1976 (47 y.o. Ginette Pitman Primary Care Provider: [name] Other Clinician: [name] Referring Provider: Treating Provider/Extender: [name] Weeks in Treatment: 82 Debridement Performed for Assessment: Wound #12R Left,Medial,Plantar Foot Performed By: Physician [name], PA-C The following information was scribed by: [name] The information was scribed for: [name] Debridement Type: Debridement Severity of Tissue Pre Debridement: Fat layer exposed Level of Consciousness (Pre-procedure): Awake and Alert Pre-procedure Verification/Time Out Yes - 12:00 Taken: Start Time: 12:00 Percent of Wound Bed Debrided: 100% T Area Debrided (cm): otal 4.71 Tissue and other material debrided: Viable, Non-Viable, Callus, Slough, Subcutaneous, Slough Level: Skin/Subcutaneous Tissue Debridement Description: Excisional Instrument: Curette Bleeding: Minimum Hemostasis Achieved:  Pressure Taubman, Panzy A ([id number]) [id number] Page 2 of 22 Response to Treatment: Procedure was tolerated well Level of Consciousness (Post- Awake and Alert procedure): Post Debridement Measurements of Total Wound Length: (cm) 3 Width: (cm) 2 Depth: (cm) 0.2 Volume: (cm) 0.942 Character of Wound/Ulcer Post Debridement: Stable Severity of Tissue Post Debridement: Fat layer exposed Post Procedure Diagnosis Same as Pre-procedure Electronic Signature(s) Signed: 10/17/2023 12:22:29 PM By: [name] Signed: 10/17/2023 3:46:23 PM By: [name] PA-C Signed: 10/19/2023 4:44:21 PM By: Midge Aver MSN RN CNS WTA Entered By: [name] on 10/17/2023 09:07:27 -------------------------------------------------------------------------------- HPI Details Patient Name: Date of Service: [name] 10/17/2023 11:00 A M Medical Record Number: [id number] Patient Account Number: [id number] Date of Birth/Sex: Treating RN: [birth year] (47 y.o. Ginette Pitman Primary Care Provider: [name] Other Clinician: Referring Provider: Treating Provider/Extender: [name] in Treatment: 37 History of Present Illness HPI Description: 01/18/18-She is here for initial evaluation of the left great toe ulcer. She is a poor historian in regards to timeframe in detail. She states approximately 4 weeks ago she lacerated her toe on something in the house. She followed up with her primary care who placed her on Bactrim and ultimately a second dose of Bactrim prior to coming to wound clinic. She states she has been treating the toe with peroxide, Betadine and a Band-Aid. She did not check her blood sugar this morning but checked it yesterday morning it was 327; she is unaware of a recent A1c and there are no current records. She saw Dr. she would've orthopedics last week for an old injury to the left ankle, she states he did not see her toe, nor did  she bring it to his attention. She smokes approximately 1 pack cigarettes a day. Her social situation is concerning, she arrives this morning with her mother who appears extremely intoxicated/under the influence; her mother was asked to leave the room and be monitored by the patient's grandmother. The patient's aunt then accompanied the patient and the room throughout the rest of the appointment. We had  a lengthy discussion regarding the deleterious effects of uncontrolled hyperglycemia and smoking as it relates to wound healing and overall health. She was strongly encouraged to decrease her smoking and get her diabetes under better control. She states she is currently on a diet and has cut down her [facility] consumption. The left toe is erythematous, macerated and slightly edematous with malodor present. The edema in her left foot is below her baseline, there is no erythema streaking. We will treat her with Santyl, doxycycline; we have ordered and xray, culture and provided a Peg assist surgical shoe and cultured the wound. 01/25/18-She is here in follow-up evaluation for a left great toe ulcer and presents with an abscess to her suprapubic area. She states her blood sugars remain elevated, feeling "sick" and if levels are below 250, but she is trying. She has made no attempt to decrease her smoking stating that we "can't take away her food in her cigarettes". She has been compliant with offloading using the PEG assist you. She is using Santyl daily. the culture obtained last week grew staph aureus and Enterococcus faecalis; continues on the doxycycline and Augmentin was added on Monday. The suprapubic area has erythema, no femoral variation, purple discoloration, minimal induration, was accessed with a cotton tip applicator with sanguinopurulent drainage, this was cultured, I suspect the current antibiotic treatment will cover and we will not add anything to her current treatment plan. She was  advised to go to urgent care or ER with any change in redness, induration or fever. 02/01/18-She is here in follow-up evaluation for left great toe ulcers and a new abdominal abscess from last week. She was able to use packing until earlier this week, where she "forgot it was there". She states she was feeling ill with GI symptoms last week and was not taking her antibiotic. She states her glucose levels have been predominantly less than 200, with occasional levels between 200-250. She thinks this was contributing to her GI symptoms as they have resolved without intervention. There continues to be significant laceration to left toe, otherwise it clinically looks stable/improved. There is now less superficial opening to the lateral aspect of the great toe that was residual blister. We will transition to [facility] to all wounds, she will continue her Augmentin. If there is no change or deterioration next week for reculture. 02/08/18-She is here in follow-up evaluation for left great toe ulcer and abdominal ulcer. There is an improvement in both wounds. She has been wrapping her left toe with coban, not by our direction, which has created an area of discoloration to the medial aspect; she has been advised to NOT use coban secondary to her neuropathy. She states her glucose levels have been high over this last week ranging from 200-350, she continues to smoke. She admits to being less compliant with her offloading shoe. We will continue with same treatment plan and she will follow-up next week. 02/15/18-She is here in follow-up evaluation for left great toe ulcer and abdominal ulcer. The abdominal ulcer is epithelialized. The left great toe ulcer is improved and all injury from last week using the Coban wrap is resolved, the lateral ulcer is healed. She admits to noncompliance with wearing offloading shoe and admits to glucose levels being greater than 300 most of the week. She continues to smoke and  expresses no desire to quit. There is one area medially that probes deeper than it has historically, erythema to the toe and dorsal foot has consistently waxed and  waned. There is no overt signs of cellulitis or infection ISLEE, YENTER A ([id number]) [id number] Page 3 of 22 but we will culture the wound for any occult infection given the new area of depth and erythema. We will hold off on sensitivities for initiation of antibiotic therapy. 02/22/18-She is here in follow up evaluation for left great toe ulcer. There is overall significant improvement in both wound appearance, erythema and edema with changes made last week. She was not initiated on antibiotic therapy. Culture obtained last week showed oxacillin sensitive staph aureus, sensitive to clindamycin. Clindamycin has been called into the pharmacy but she has been instructed to hold off on initiation secondary to overall clinical improvement and her history of antibiotic intolerance. She has been instructed to contact the clinic with any noted changes/deterioration and the wound, erythema, edema and/or pain. She will follow-up next week. She continues to smoke and her glucose levels remain elevated >250; she admits to compliance with offloading shoe 03/01/18 on evaluation today patient appears to be doing fairly well in regard to her left first toe ulcer. She has been tolerating the dressing changes with the [facility] Dressing without complication and overall this has definitely showed signs of improvement according to records as well is what the patient tells me today. I'm very pleased in that regard. She is having no pain today 03/08/18 She is here for follow up evaluation of a left great toe ulcer. She remains non-compliant with glucose control and smoking cessation; glucose levels consistently >200. She states that she got new shoe inserts/peg assist. She admits to compliance with offloading. Since my last  evaluation there is significant improvement. We will switch to prisma at this time and she will follow up next week. She is noted to be tachycardic at this appointment, heart rate 120s; she has a history of heart rate 70-130 according to our records. She admits to extreme agitation r/t personal issues; she was advised to monitor her heartrate and contact her physician if it does not return to a more normal range (<100). She takes cardizem twice daily. 03/15/18-She is here in follow-up evaluation for left great toe ulcer. She remains noncompliant with glucose control and smoking cessation. She admits to compliance with wearing offloading shoe. The ulcer is improved/stable and we will continue with the same treatment plan and she will follow-up next week 03/22/18-She is here for evaluation for left great toe ulcer. There continues to be significant improvement despite recurrent hyperglycemia (over 500 yesterday) and she continues to smoke. She has been compliant with offloading and we will continue with same treatment plan and she will follow-up next week. 03/29/18-She is here for evaluation for left great toe ulcer. Despite continuing to smoke and uncontrolled diabetes she continues to improve. She is compliant with offloading shoe. We will continue with the same treatment plan and she will follow-up next week 04/05/18- She is here in follow up evaluation for a left great toe ulcer; she presents with small pustule to left fifth toe (resembles ant bite). She admits to compliance with wearing offloading shoe; continues to smoke or have uncontrolled blood glucose control. There is more callus than usual with evidence of bleeding; she denies known trauma. 04/12/18-She is here for evaluation of left great toe ulcer. Despite noncompliance with glycemic control and smoking she continues to make improvement. She continues to wear offloading shoe. The pustule, that was identified last week, to the left fifth toe is  resolved. She will follow-up in 2 weeks 05/03/18-she is  seen in follow-up evaluation for a left great toe ulcer. She is compliant with offloading, otherwise noncompliant with glycemic control and smoking. She has plateaued and there is minimal improvement noted. We will transition to [facility], replaced the insert to her surgical shoe and she will follow-up in one week 05/10/18- She is here in follow up evaluation for a left great toe ulcer. It appears stable despite measurement change. We will continue with same treatment plan and follow up next week. 05/24/18-She is seen in follow-up evaluation for a left great toe ulcer. She remains compliant with offloading, has made significant improvement in her diet, decreasing the amount of sugar/soda. She said her recent A1c was 10.9 which is lower than. She did see a diabetic nutritionist/educator yesterday. She continues to smoke. We will continue with the same treatment plan and she'll follow-up next week. 05/31/18- She is seen in follow-up evaluation for left great toe ulcer. She continues to remain compliant with offloading, continues to make improvement in her diet, increasing her water and decreasing the amount of sugar/soda. She does continue to smoke with no desire to quit. We will apply Prisma to the depth and Hydrofera Blue over. We have not received insurance authorization for oasis. She will follow up next week. 06/07/18-She is seen in follow-up evaluation for left great toe ulcer. It has stalled according to today's measurements although base appears stable. She says she saw a diabetic educator yesterday; her average blood sugars are less than 300 which is an improvement for her. She continues to smoke and states "that's my next step" She continues with water over soda. We will order for xray, culture and reinstate ace wrap compression prior to placing apligraf for next week. She is voicing no complaints or concerns. Her dressing will change to  iodoflex over the next week in preparation for apligraf. 06/14/18-She is seen in follow-up evaluation for left great toe ulcer. Plain film x-ray performed last week was negative for osteomyelitis. Wound culture obtained last week grew strep B and OSSA; she is initiated on keflex and cefdinir today; there is erythema to the toe which could be from ace wrap compression, she has a history of wrapping too tight and has has been encouraged to maintain ace wraps that we place today. We will hold off on application of apligraf today, will apply next week after antibiotic therapy has been initiated. She admits today that she has resumed taking a shower with her foot/toe submerged in water, she has been reminded to keep foot/toe out of the bath water. She will be seen in follow up next week 06/21/18-she is seen in follow-up evaluation for left great toe ulcer. She is tolerating antibiotic therapy with no GI disturbance. The wound is stable. Apligraf was applied today. She has been decreasing her smoking, only had 4 cigarettes yesterday and 1 today. She continues being more compliant in diabetic diet. She will follow-up next week for evaluation of site, if stable will remove at 2 weeks. 06/28/18- She is here in follow up evalution. Apligraf was placed last week, she states the dressing fell off on Tuesday and she was dressing with hydrofera blue. She is healed and will be discharged from the clinic today. She has been instructed to continue with smoking cessation, continue monitoring glucose levels, offloading for an additional 4 weeks and continue with hydrofera blue for additional two weeks for any possible microscopic opening. Readmission: 08/07/18 on evaluation today patient presents for reevaluation concerning the ulcer of her right great  toe. She was previously discharged on 06/28/18 healed. Nonetheless she states that this began to show signs of drainage she subsequently went to her primary care provider.  Subsequently an x-ray was performed on 08/01/18 which was negative. The patient was also placed on antibiotics at that time. Fortunately they should have been effective for the infection. Nonetheless she's been experiencing some improvement but still has a lot of drainage coming from the wound itself. 08/14/18 on evaluation today patient's wound actually does show signs of improvement in regard to the erythema at this point. She has completed the antibiotics. With that being said we did discuss the possibility of placing her in a total contact cast as of today although I think that I may want to give this just a little bit more time to ensure nothing recurrence as far as her infection is concerned. I do not want to put in the cast and risk infection at that time if things are not completely resolved. With that being said she is gonna require some debridement today. 08/21/18 on evaluation today patient actually appears to be doing okay in regard to her toe ulcer. She's been tolerating the dressing changes without complication. With that being said it does appear that she is ready and in fact I think it's appropriate for [place] to go ahead and initiate the total contact cast today. Nonetheless she will require some sharp debridement to prepare the wound for application. Overall I feel like things have been progressing well but we do need to do something to get this to close more readily. 08/24/18 patient seen today for reevaluation after having had the total contact cast applied on Tuesday. She seems to have done very well the wound appears to be doing great and overall I'm pleased with the progress that she's made. There were no abnormal areas of rubbing from the cast on her lower extremity. 08/30/18 on evaluation today patient actually appears to be completely healed in regard to her plantar toe ulcer. She tells me at this point she's been having a lot of issues with the cast. She almost fell a couple of  times the state shall the step of her dog a couple times as well. This is been a very frustrating process for her other nonetheless she has completely healed the wound which is excellent news. Overall there does not appear to be the evidence of infection at this time which is great news. 09/11/18 evaluation today patient presents for follow-up concerning her great toe ulcer on the left which has unfortunately reopened since I last saw her which was only a couple of weeks ago. Unfortunately she was not able to get in to get the shoe and potentially the AFO that's gonna be necessary due to her left foot drop. She continues with offloading shoe but this is not enough to prevent her from reopening it appears. When we last had her in the total contact cast she did well from a healing standpoint but unfortunately the wound reopened as soon as she came out of the cast within just a couple of weeks. 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 This is also part of the reason why she falls on a regular basis. I just do not believe that is gonna be safe  for [place] to reinitiate the total contact cast as last time we had this on she fell 3 times one week which is definitely not normal for her. 09/18/18 upon evaluation today the patient actually appears to be doing about the same in regard to her toe ulcer. She did not contact [employer] as I asked her to even though I had given her the prescription. In fact she actually states that she has no idea where the prescription is. She did apparently call [employer] and they told her that all she needed to do was bring the prescription in order to be able to be seen and work on getting the AFO for her left foot. With all that being said she still does not have an appointment and I'm not  sure were things stand that regard. I will give her a new prescription today in order to contact them to JONIKA, ENGDAHL A ([id number]) [id number] Page 4 of 22 get this set up. 09/25/18 on evaluation today patient actually appears to be doing about the same in regard to her toes ulcer. She does have a small areas which seems to have a lot of callous buildup around the edge of the wound which is going to need sharp debridement today. She still is waiting to be scheduled for evaluation with [employer] for possibility of an AFO. She states there supposed to call her tomorrow to get this set up. Unfortunately it does appear that her foot specifically the toe area is showing signs of erythema. There does not appear to be any systemic infection which is in these good news. 10/02/18 on evaluation today patient actually appears to be doing about the same in regard to her toe ulcer. This really has not done too well although it's not significantly larger it's also not significantly smaller. She has been tolerating the dressing changes without complication. She actually has her appointment with [employer] and  tomorrow to hopefully be measured for obtaining and AFO splint. I think this would be helpful preventing this from reoccurring. We had contemplated starting the cast this week although to be honest I am reluctant to do that as she's been having nausea, vomiting, and seizure activity over the past three days. She has a history of seizures and have been told is nothing that can be done for these. With that being said I do believe that along with the seizures have the nausea vomiting which upon further questioning doesn't seem to be the normal for her and makes me concerned for the possibility of infection or something else going on. I discussed this with the patient and her mother during the office visit today. I do not feel the wound is effective but maybe something else. The  responses this was "this just happens to her at times and we don't know why". They did not seem to be interested in going to the hospital to have this checked out further. 10/09/18 on evaluation today patient presents for follow-up concerning her ongoing toe ulcer. She has been tolerating the dressing changes without complication. Fortunately there does not appear to be any evidence of infection which is great news however I do think that the patient would benefit from going ahead for with the total contact cast. She's actually in a wheelchair today she tells me that she will use her walker if we initiate the cast. I was very specific about the fact that if we were gonna do the cast I wanted to make sure that she was using  the walker in order to prevent any falls. She tells me she does not have stairs that she has to traverse on a regular basis at her home. She has not had any seizures since last week again that something that happens to her often she tells me she did talk to [employer] and they said that it may take up to three weeks to get the brace approved for her. Hopefully that will not take that long but nonetheless in the meantime I do think the cast could be of benefit. 10/12/18 on evaluation today patient appears to be doing rather well in regard to her toe ulcer. It's just been a few days and already this is significantly improved both as far as overall appearance and size. Fortunately there's no sign of infection. She is here for her first obligatory cast change. 10/19/18 Seen today for follow up and management of left great toe ulcer. Wound continues to show improvement. Noted small open area with seroussang drainage with palpation. Denies any increased pain or recent fevers during visit. She will continue calcium alginate with offloading shoe. Denies any questions or concerns during visit. 10/26/18 on evaluation today patient appears to be doing about the same as when I last saw her in regard  to her wound bed. Fortunately there does not appear to be any signs of infection. Unfortunately she continues to have a breakdown in regard to the toe region any time that she is not in the cast. It takes almost no time at all for this to happen. Nonetheless she still has not heard anything from the brace being made by [employer] as to when exactly this will be available to her. Fortunately there is no signs of infection at this time. 10/30/18 on evaluation today patient presents for application of the total contact cast as we just received him this morning. Fortunately we are gonna be able to apply this to her today which is great news. She continues to have no significant pain which is good news. Overall I do feel like things have been improving while she was the cast is when she doesn't have a cast that things get worse. She still has not really heard anything from [employer] regarding her brace. 11/02/18 upon evaluation today patient's wound already appears to be doing significantly better which is good news. Fortunately there does not appear to be any signs of infection also good news. Overall I do think the total contact cast as before is helping to heal this area unfortunately it's just not gonna likely keep the area closed and healed without her getting her brace at least. Again the foot drop is a significant issue for her. 11/09/18 on evaluation today patient appears to be doing excellent in regard to her toe ulcer which in fact is completely healed. Fortunately we finally got the situation squared away with the paperwork which was needed to proceed with getting her brace approved by Medicaid. I have filled that out unfortunately that information has been sent to the orthopedic office that I worked at 2 1/2 years ago and not tired Current wound care measures. Fortunately she seems to be doing very well at this time. 11/23/18 on evaluation today patient appears to be doing More Poorly Compared to Last  Time I Saw Her. At [facility] She Had Completely Healed. Currently she is continuing to have issues with reopening. She states that she just found out that the brace was approved through Medicaid now she just has to go get  measured in order to have this fitted for her and then made. Subsequently she does not have an appointment for this yet that is going to complicate things we obviously cannot put her back in the cast if we do not have everything measured because they're not gonna be able to measure her foot while she is in the cast. 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 Obviously this is unfortunate and does have me somewhat worried as well. 11/30/18 on evaluation today patient's toe ulcer actually appears to be doing fairly well. The good news is she will be getting her brace in the shoes next week on Wednesday. Hopefully we will be able to get this to heal without having to go back in the cast however she may need the cast in order to get the wound completely heal and then go from there. Fortunately there's no signs of infection at this time. 12/07/18 on evaluation today patient fortunately did receive her brace and she states she could tell this definitely makes her walk better. With that being said she's been having issues with her toe where she noticed yesterday there was a lot of tissue that was loosing off this appears to be much larger than what it was previous. She also states that her leg has been read putting much across the top of her foot just about the ankle although this seems to be receiving somewhat. The total area is still red and appears to be someone infected as best I can tell. She is previously taken Bactrim and that may be a good option for her today as well. We are gonna see what I wound culture shows as well and I think that this is definitely appropriate. With that being said  outside of the culture I still need to initiate something in the interim and that's what I'm gonna go ahead and select Bactrim is a good option for her. 12/14/18 on evaluation today patient appears to be doing better in regard to her left great toe ulcer as compared to last week's evaluation. There's still some erythema although this is significantly improved which is excellent news. Overall I do believe that she is making good progress is still gonna take some time before she is where I would like her to be from the standpoint of being able to place her back into the total contact cast. Hopefully we will be where we need to be by next week. 12/21/18 on evaluation today patient actually appears to be doing poorly in regard to her toe ulcer. She's been tolerating the dressing changes without complication. Fortunately there's no signs of systemic infection although she does have a lot of drainage from the toe ulcer and this does seem to be causing some issues at this point. She does have erythema on the distal portion of her toe that appears to be likely cellulitis. 12/28/18 on evaluation today patient actually appears to be doing a little better in my pinion in regard to her toe ulcer. With that being said she still does have some evidence of infection at this time and for her culture she had both E. coli as well as enterococcus as organisms noted on evaluation. For that reason I think that though the Keflex likely has treated the E. coli rather well this has really done nothing for the enterococcus. We are going to have to initiate treatment  for this specifically. 01/04/19 on evaluation today patient's toe actually appears to be doing better from the standpoint of infection. She currently would like to see about putting the cash back on I think that this is appropriate as long as she takes care of it and keeps it from getting wet. She is gonna have some drainage we can definitely pass this up with Drawtex  and alginate to try to prevent as much drainage as possible from causing the problems. With that being said I do want to at least try her with the cast between now and Tuesday. If there any issues we can't continue to use it then I will discontinue the use of the cast at that point. 01/08/19 on evaluation today patient actually appears to be doing very well as far as her foot ulcer specifically the great toe on the left is concerned. She did have an area of rubbing on the medial aspect of her left ankle which again is from the cast. Fortunately there's no signs of infection at this point in this appears to be a very slight skin breakdown. The patient tells me she felt it rubbing but didn't think it was that bad. Fortunately there is no signs of active infection at this time which is good news. No fevers, chills, nausea, or vomiting noted at this time. BENNET, MADDOX A ([id number]) [id number] Page 5 of 22 01/15/19 on evaluation today patient actually appears to be doing well in regard to her toe ulcer. Again as previous she seems to do well and she has the cast on which indicates to me that during the time she doesn't have a cast on she's putting way too much pressure on this region. Obviously I think that's gonna be an issue as with the current national emergency concerning the Covid-19 Virus it has been recommended that we discontinue the use of total contact casting by the chief medical officer of our company, Dr. Maurine Minister. The reasoning is that if a patient becomes sick and cannot come into have the cast removed they could not just leave this on for an additional two weeks. Obviously the hospitals also do not want to receive patient's who are sick into the emergency department to potentially contaminate the region and spread the Covid-19 Virus among other sick individuals within the hospital system. Therefore at this point we are suspending the use of total contact cast until  the current emergency subsides. This was all discussed with the patient today as well. 01/22/19 on evaluation today patient's wound on her left great toe appears to be doing slightly worse than previously noted last week. She tells me that she has been on this quite a bit in fact she tells me she's been awake for 38 straight hours. This is due to the fact that she's having to care for grandparents because nobody else will. She has been taking care of them for five the last seven days since I've seen her they both have dementia his is from a stroke and her grandmother's was progressive. Nonetheless she states even her mom who knows her condition and situation has only help two of those days to take care of them she's been taking care of the rest. Fortunately there does not appear to be any signs of active infection in regard to her toe at this point although obviously it doesn't look as good as it did previous. I think this is directly related to her not taking off the pressure and friction by way  of taking things easy. Though I completely understand what's going on. 01/29/19 on evaluation today patient's tools are actually appears to be showing some signs of improvement today compared to last week's evaluation as far as not necessarily the overall size of the wound but the fact that she has some new skin growth in between the two ends of the wound opening. Overall I feel like she has done well she states that she had a family member give her what sounds to be a CAM walker boot which has been helpful as well. 02/05/19 on evaluation today patient's wound bed actually appears to be doing significantly better in regard to her overall appearance of the size of the wound. With that being said she is still having an issue with offloading efficiently enough to get this to close. Apparently there is some signs of infection at this point as well unfortunately. Previously she's done well of Augmentin I really do not  see anything that needs to be culture currently but there theme and cellulitis of the foot that I'm seeing I'm gonna go ahead and place her on an antibiotic today to try to help clear this up. 02/12/2019 on evaluation today patient actually appears to be doing poorly in regard to her overall wound status. She tells me she has been using her offloading shoe but actually comes in today wearing her tennis shoe with the AFO brace. Again as I previously discussed with her this is really not sufficient to allow the area to heal appropriately. Nonetheless she continues to be somewhat noncompliant and I do wonder based on what she has told my nurse in the past as to whether or not a good portion of this noncompliance may be recreational drug and alcohol related. She has had a history of heroin overdose and this was fairly recently in the past couple of months that have been seeing her. Nonetheless overall I feel like her wound looks significantly worse today compared to what it was previous. She still has significant erythema despite the Augmentin I am not sure that this is an appropriate medication for her infection I am also concerned that the infection may have gone down into her bone. 02/19/19 on evaluation today patient actually appears to be doing about the same in regard to her toe ulcer. Unfortunately she continues to show signs of bone exposure and infection at this point. There does not appear to be any evidence of worsening of the infection but I'm also not really sure that it's getting significantly better. She is on the Augmentin which should be sufficient for the Staphylococcus aureus infection that she has at this point. With that being said she may need IV antibiotics to more appropriately treat this. We did have a discussion today about hyperbaric option therapy. 02/28/19 on evaluation today patient actually appears to be doing much worse in regard to the wound on her left great toe as compared to  even my previous evaluation last week. Unfortunately this seems to be training in a pretty poor direction. Her toe was actually now starting to angle laterally and I can actually see the entire joint area of the proximal portion of the digit where is the distal portion of the digit again is no longer even in contact with the joint line. Unfortunately there's a lot more necrotic tissue around the edge and the toe appears to be showing signs of becoming gangrenous in my pinion. I'm very concerned about were things stand at this point. She  did see infectious disease and they are planning to send in a prescription for Sivextro for her and apparently this has been approved. With that being said I don't think she should avoid taking this but at the same time I'm not sure that it's gonna be sufficient to save her toe at this point. 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 03/05/19 on evaluation today patient actually appears to be doing quite well in regard to her toe all things considering. She still has bone exposed but there appears to be much less your thing on overall the appearance of the wound and the toe itself is dramatically improved. She still does have some issues currently obviously with infection she did see vascular as well and there concerned that her blood flow to the toad. For that reason they are setting up for an angiogram next week. 03/14/19 on evaluation today patient appears to be doing very poor in regard to her toe and specifically in regard to the ulceration and the fact that she's starting to notice the toe was leaning even more towards the lateral aspect and the complete joint is visible on the proximal aspect of the joint. Nonetheless she's also noted a significant odor and the  tip of the toe is turning more dark and necrotic appearing. Overall I think she is getting worse not better as far as this is concerned. For that reason I am recommending at this point that she likely needs to be seen for likely amputation. READMISSION 03/19/2021 This is a patient that we cared for in this clinic for a prolonged period of time in 2019 and 2020 with a left foot and left first toe wound. 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 Excell Seltzer. In December 2021 she had an ulcer on her right great toe as well as the fourth and fifth toes. 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 She also had an angiogram at that time and underwent angioplasty of the right anterior tibial artery. In any case she claims that the wound on the right foot is closed I did not look at this today which was probably an oversight although I think that should be done next week. After her surgery she developed a dehiscence but I do not see any follow-up of this. According to Dr. Bernette Redbird last review that she was out of the area being cared for by another physician but recently came back to his attention. 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 A culture of this area showed E. coli apparently before she came back to see Dr. Excell Seltzer she was supposed to be receiving antibiotics but she did not really take them. Nor is she offloading this area at all. Finally her last hemoglobin A1c listed in epic was in March 2022 at 14.1 she says things are a lot better since then although I am not sure. 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 She had this wound at the time. During this admission she had E. coli sepsis a TEE was negative. Past medical history  is extensive and includes  type 2 diabetes with peripheral neuropathy cardiomyopathy with an ejection fraction of 33%, hypertension, hyperlipidemia chronic renal failure stage III history of substance abuse with cocaine although she claims to be clean now verified by her mother. She is still a heavy cigarette smoker. She has a history of bipolar disorder seizure disorder ABI in our clinic was 1.05 6/1; left midfoot in the setting of a TMA done previously. Round circular wound with a "knuckle" of protruding tissue. The problem is that the knuckle was not attached to any of the surrounding granulation and this probed proximally widely I removed a large portion of this tissue. This wound goes with considerable undermining laterally. I do not feel any bone there was no purulence but this is a deep wound. 6/8; in spite of the debridement I did last week. She arrives with a wound looking exactly the same. A protruding "knuckle" of tissue nonadherent to most of the surrounding tissue. There is considerable depth around this from 6-12 o'clock at 2.7 cm and undermining of 1 cm. This does not look overtly infected and the x- ray I did last week was negative for any osseous abnormalities. We have been using silver collagen 6/15; deep tissue culture I did last week showed moderate staph aureus and moderate Pseudomonas. This will definitely require prolonged antibiotic therapy. The pathology on the protuberant area was negative for malignancy fungus etc. 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 We have been using silver collagen. I am going to be prescribing Levaquin for 2 weeks. Her CT scan of the foot is down for 7/5 6/22; CT scan of the foot on 7 5. She says she has hardware in the left leg from her previous fracture. She is on the Levaquin for the deep tissue culture I did that showed methicillin sensitive staph aureus and Pseudomonas. I gave her a 2-week supply and she will  have another week. She arrives in clinic today with the same protuberant tissue however this is nonadherent to the tissue surrounding it. I am really at a loss to explain this unless there is underlying deep tissue infection MARQUERITE, HEGGS A ([id number]) [id number] Page 6 of 22 6/29; patient presents for 1 week follow-up. She has been using collagen to the wound bed. She reports taking her antibiotics as prescribed.She has no complaints or issues today. She denies signs of infection. 7/6; patient presents for one week followup. She has been using collagen to the wound bed. She states she is taking Levaquin however at times she is not able to keep it down. She denies signs of infection. 7/13; patient presents for 1 week follow-up. She has been using silver alginate to the wound bed. She still has nausea when taking her antibiotics. She denies signs of infection. 7/20; patient presents for 1 week follow-up. She has been using silver alginate with gentamicin cream to the wound bed. She denies any issues and has no complaints today. She denies signs of infection. 7/27; patient presents for 1 week follow-up. She continues to use silver alginate with gentamicin cream to the wound bed. She reports starting her antibiotics. She has no issues or complaints. Overall she reports stability to the wound. 8/3; patient presents for 1 week follow-up. She has been using silver alginate with gentamicin cream to the wound bed. She reports completing all antibiotics. She has no issues or complaints today. She denies signs of infection. 8/17; patient presents for 2-week follow-up. He  is to use silver alginate to the wound bed. She has no issues or complaints today. She denies signs of infection. She reports her pain has improved in her foot since last clinic visit 8/24; patient presents for 1 week follow-up. She continues to use silver alginate to the wound bed. She has no issues or  complaints. She denies signs of infection. Pain is stable. 9/7; patient presents for follow-up. She missed her last week appointment due to feeling ill. She continues to use silver alginate. She has a new wound to the right lower extremity that is covered in eschar. She states It occurred over the past week and has no idea how it started. She currently denies signs of infection. 9/14; patient presents for follow-up. T the left foot wound she has been using gentamicin cream and silver alginate. T the right lower extremity wound she has o o been keeping this covered and has not obtain Santyl. 9/21; patient presents for follow-up. She reports using gentamicin cream and silver alginate to the left foot and Santyl to the right lower extremity wound. She has no issues or complaints today. She denies signs of infection. 9/28; patient presents for follow-up. She reports a new wound to her right heel. She states this occurred a few days ago and is progressively gotten worse. She has been trying to clean the area with a Q-tip and Santyl. She reports stability in the other 2 wounds. She has been using gentamicin cream and silver alginate to the left foot and Santyl to the right lower extremity wound. 10/12; patient presents for follow-up. She reports improvement to the wound beds. She is seeing vein and vascular to discuss the potential of a left BKA. She states they are going to do an arteriogram. She continues to use silver alginate with dressing changes to her wounds. 11/2; patient presents for follow-up. She states she has not been doing dressing changes to the wound beds. She states she is not able to offload the areas. She reports chronic pain to her left foot wound. 11/9; patient presents for follow-up. She came in with only socks on. She states she forgot to put on shoes. It is unclear if she is doing any dressing changes. She currently denies systemic signs of infection. 11/16; patient presents for  follow-up. She came again only with socks on. She states she does not wear shoes ever. It is unclear if she does dressing changes. She currently denies systemic signs of infection. 11/23; patient presents for follow-up. She wore her shoes today. It still unclear exactly what dressing she is using for each wound but she did states she obtained Dakin's solution and has been using this to the left foot wound. She currently denies signs of infection. 11/30; patient presents for follow-up. She has no issues or complaints today. She currently denies signs of infection. 12/7; patient presents for follow-up. She has no issues or complaints today. She has been using Hydrofera Blue to the right heel wound and Dakin solution to the left foot wound. Her right anterior leg wound is healed. She currently denies signs of infection. 12/14; patient presents for follow-up. She has been using Hydrofera Blue to the right heel and Dakin's to the left foot wounds. She has no issues or complaints today. She denies signs of infection. 12/21; patient presents for follow-up. She reports using Hydrofera Blue to the right heel and Dakin's to the left foot wound. She denies signs of infection. 12/28; patient presents for follow-up. She continues to  use Dakin's to the left foot wound and Hydrofera Blue to the right heel wound. She denies signs of infection. 1/4; patient presents for follow-up. She has no issues or complaints today. She denies signs of infection. 1/11; patient presents for follow-up. It is unclear if she has been dressing these wounds over the past week. She currently denies signs of infection. 1/18; patient presents for follow-up. She states she has been using Dakin's wet-to-dry dressings to the left foot. She has been using Hydrofera Blue to the right foot foot wound. She states that the anterior right leg wound has reopened and draining serous fluid. She denies signs of infection. 1/25; patient presents for  follow-up. She has no issues or complaints today. 2/1; patient presents for follow-up. She has no issues or complaints today. She denies signs of infection. 2/8; patient presents for follow-up. She has lost her surgical shoes. She did not have a dressing to the right heel wound. She currently denies signs of infection. 2/15; patient presents for follow-up. She reports more pain to the right heel today. She denies purulent drainage Or fever/chills 2/22; patient presents for follow-up. She reports taking clindamycin over the past week. She states that she continues to have pain to her right heel. She reports purulent drainage. Readmission 03/16/2022 Ms. Poppie Magnano is a 47 year old female with a past medical history of type 2 diabetes, osteomyelitis to her feet, chronic systolic heart failure and bipolar disorder that presents to the clinic for bilateral feet wounds and right lower extremity wound. She was last seen in our clinic on 12/15/2021. At that time she had purulent drainage coming out of her right plantar foot and I recommended she go to the ED. She states she went to [facility] and has been there for the past 3 months. I cannot see the records. She states she had OR debridement and was on several weeks of IV antibiotics while inpatient. Since discharge she has not been taking care of the wound beds. She had nothing on her feet other than socks today. She currently denies signs of infection. 5/31; patient presents for follow-up. She has been using Dakin's wet-to-dry dressings to the wound beds on her feet bilaterally and antibiotic ointment to the right anterior leg wound. She had a wound culture done at last clinic visit that showed moderate Pseudomonas aeruginosa sensitive to ciprofloxacin. She currently denies systemic signs of infection. 6/14; patient presents for follow-up. She received Keystone 5 days ago and has been using this on the wound beds. She states that last week  she had to go to the hospital because she had increased warmth and erythema to the right foot. She was started on 2 oral antibiotics. She states she has been taking these. She currently denies systemic signs of infection. She has no issues or complaints today. 6/21; patient presents for follow-up. She states she has been using Keystone antibiotics to the wound beds. She has no issues or complaints today. She denies signs of infection. ADARIA, HUNSLEY A ([id number]) [id number] Page 7 of 22 6/28; patient presents for follow-up. She has been using Keystone antibiotics to the wound beds. She has no issues or complaints today. 7/12; patient presents for follow-up. Has been using Keystone antibiotics to the wound beds with calcium alginate. She has no issues or complaints today. She never followed up with her orthopedic surgeon who did the OR debridement to the right foot. We discussed the total contact cast for the left foot and patient would like to  do this next week. 7/19; patient presents for follow-up. She has been using Keystone antibiotics with calcium alginate to the wound beds. She has no issues or complaints today. Patient is in agreement to do the total contact cast of the left foot today. She knows to return later this week for the obligatory cast change. 05-13-2022 upon evaluation today patient's wound which she has the cast of the left leg actually appears to be doing significantly better. Fortunately I do not see any signs of active infection locally or systemically which is great news and overall I am extremely pleased with where we stand currently. 7/26; patient presents for follow-up. She has a cast in place for the past week. She states it irritated her shin. Other than that she tolerated the cast well. She states she would like a break for 1 week from the cast. We have been using Keystone antibiotic and Aquacel to both wound beds. She denies signs  of infection. 8/2; patient presents for follow-up. She has been using Keystone and Aquacel to the wound beds. She denies any issues and has no complaints. She is agreeable to have the cast placed today for the left leg. 06-03-2022 upon evaluation today patient appears to be doing well with regard to her wound she saw some signs of improvement which is great news. Fortunately I do not see any evidence of active infection locally or systemically at this time which is great news. No fevers, chills, nausea, vomiting, or diarrhea. 8/16; patient presents for follow-up. She has no issues or complaints today. We have been using Keystone and Aquacel to the wound beds. The left lower extremity is in a total contact cast. She is tolerated this well. 8/23; patient presents for follow-up. She has had the total contact cast on the left leg for the past week. Unfortunately this has rubbed and broken down the skin to the medial foot. She currently denies signs of infection. She has been using Keystone antibiotic to the right plantar foot wound. 8/30; patient presents for follow-up. We have held off on the total contact cast for the left leg for the past week. Her wound on the left foot has improved and the previous surrounding breakdown of skin has epithelialized. She has been using Keystone antibiotic to both wound beds. She has no issues or complaints today. She denies signs of infection. 9/6; patient presents for follow-up. She has ordered her's Keystone antibiotic and this is arriving this week. She has been doing Dakin's wet-to-dry dressings to the wound beds. She denies signs of infection. She is agreeable to the total contact cast today. 9/13; patient presents for follow-up. She states that the cast caused her left leg shin to be sore. She would like to take a break from the cast this week. She has been using Keystone antibiotic to the right plantar foot wound. She denies signs of infection. 9/20; patient  presents for follow-up. She has been using Keystone antibiotics to the wound beds with calcium alginate to the right foot wound and Hydrofera Blue to the left foot wound. She is agreeable to having the cast placed today. She has been approved for Apligraf and we will order this for next clinic visit. 9/27; patient presents for follow-up. We have been using Keystone antibiotic with Hydrofera Blue to the left foot wound under a total contact cast. T the right o foot wound she has been using Keystone antibiotic and calcium alginate. She declines a total contact cast today. Apligraf is available for  placement and she would like to proceed with this. 07-28-2022 upon evaluation today patient appears to be doing well currently in regard to her wound. She is actually showing signs of significant improvement which is great news. Fortunately I do not see any evidence of active infection locally nor systemically at this time. She has been seeing Dr. Mikey Bussing and to be honest has been doing very well with the cast. Subsequently she comes in today with a cast on and we did reapply that today as well. She did not really want to she try to talk me out of that but I explained that if she wanted to heal this is really the right way to go. Patient voiced understanding. In regard to her right foot this is actually a lot better compared to the last time I saw her which is also great news. 10/11; patient presents for follow-up. Apligraf and the total contact cast was placed to the left leg at last clinic visit. She states that her right foot wound had burning pain to it with the placement of Apligraf to this area. She has been doing Capitola over this area. She denies signs of infection including increased warmth, erythema or purulent drainage. 11/1; 3-week follow-up. The patient fortunately did not have a total contact cast or an Apligraf and on the left foot. She has been using Keystone ABD pads and kerlix and her own  running shoes She arrives in clinic today with thick callus and a very poor surface on the left foot on the right nonviable skin subcutaneous tissue and a deep probing hole. 11/15; patient missed her last clinic appointment. She states she has not been dressing the wound beds for the past 2 weeks. She states that at she had a new roommate but is now going back to live with her mother. Apparently its been a distracting 2 weeks. Patient currently denies signs of infection. 11/22; patient presents for follow-up. She states she has been using Keystone antibiotic and Dakin's wet-to-dry dressings to the wound beds. She is agreeable for cast placement today. We had ordered Apligraf however this has not been received by our facility. 11/29; Patient had a total contact cast placed at last clinic visit and she tolerated this well. We were using silver alginate under the cast. Patient's been using Keystone antibiotic with Aquacel to the right plantar foot wound. She has no issues or complaints today. Apligraf is available for placement today. Patient would like to proceed with this. 12/6; patient presents for follow-up. She had Apligraf placed in standard fashion last clinic visit under the total contact cast to the left lower extremity. She has been using Keystone antibiotic and Aquacel to the right plantar foot wound. She has no issues or complaints today. 12/13; patient presents for follow-up. She has finished 5 Apligraf placements. Was told she would not qualify for more. We have been doing a total contact cast to the left lower extremity. She has been using Keystone antibiotic and Aquacel to the right plantar foot wound. She has no issues or complaints today. 12/20; patient presents for follow-up. We have been using Hydrofera Blue with Keystone antibiotic under a total contact cast of the left lower extremity. She reports using Keystone antibiotic and silver alginate to the right heel wound. She has no  issues or complaints today. 12/27; patient presents with a healthy wound on the left midfoot. We have Apligraf to apply that to that more also using a total contact cast. On the right we  are using Keystone and silver alginate. She is offloading the right heel with a surgical shoealthough by her admission she is on her feet quite a bit 1/3; patient presents for follow-up. Apligraf was placed to the wound beds last clinic visit. She was placed in a total contact cast to the left lower extremity. She declines a total contact cast today. She states that her mother is in the hospital and she cannot adequately get around with the cast on. 1/10; patient presents for follow-up. She declined the total contact cast at last clinic visit. Both wounds have declined in appearance. She states that she has been on her feet and not offloading the wound beds. She currently denies signs of infection. 1/17; patient presents for follow-up. She had the total contact cast along with Apligraf placed last week to the left lower extremity. She tolerated this well. She has been using Aquacel Ag and Keystone antibiotic to the right heel wound. She currently denies signs of infection. She has no issues or complaints today. PERINA, BROCIOUS A ([id number]) [id number] Page 8 of 22 1/24; patient presents for follow-up. We have been using Apligraf to the left foot wound along with a total contact cast. She has done well with this. T the right o heel wound she has been using Aquacel Ag and Keystone antibiotic ointment. She has no issues or complaints today. She denies signs of infection. 1/31; patient presents for follow-up. We have been using Apligraf to the left foot wound along with the total contact cast. She continues to do well with this. To the right heel we have been using Aquacel Ag and Keystone antibiotic ointment. She has no issues or complaints today. 12-01-2022 upon evaluation patient is seen today  on my schedule due to the fact that she unfortunately was in the hospital yesterday. Her cast needed to come off the only reason she is out of the hospital is due to the fact that they would not take it off at the hospital which is somewhat bewildered reading to me to be perfectly honest. I am not certain why this was but either way she was released and then was placed on my schedule today in order to get this off and reapply the total contact casting as appropriate. I do not have an Apligraf for her today it was applied last week and today's actually expired yesterday as there was some scheduling conflicts with her being in the hospital. Nonetheless we do not have that for reapplication today but the good news that she is not draining too much and the Apligraf can go for up to 2 weeks so I am going to go ahead and reapply the total contact casting but we are going to leave the Apligraf in place. 2/14; patient presents for follow-up. T the left leg she has had the total contact cast and Apligraf for the past week. She has had no issues with this. T the o o right heel she has been using antibiotic ointment and Aquacel Ag. 2/21; patient presents for follow-up. We have been using Apligraf and a total contact cast to the left lower extremity. She is tolerated this well. Unfortunately she is not approved for any more Apligraf per insurance. She has been using antibiotic ointment and Aquacel Ag to the right foot. She has no issues or complaints today. 2/28; patient presents for follow-up. We have been using Hydrofera Blue and antibiotic ointment under the total contact cast to the left lower extremity. He has been using  Aquacel Ag with antibiotic ointment to the right plantar foot. She has no issues or complaints today. 3/6; patient presents for follow-up. She did not obtain her gentamicin ointment. She has been using Aquacel Ag to the right plantar foot wound. We have been using Hydrofera Blue with  antibiotic ointment under the total contact cast to the left lower extremity. She has no issues or complaints today. 3/12; patient presents for follow-up. She has been using gentamicin ointment and Aquacel Ag to the right plantar foot wound. We have been using Hydrofera Blue with antibiotic ointment under the total contact cast on the left lower extremity foot wound. She has no issues or complaints today. 3/20; patient presents for follow-up. She has been using gentamicin ointment and Aquacel Ag to the right plantar foot wound. We have been using Hydrofera Blue with antibiotic ointment under the total contact cast to the left lower extremity wound. She has no issues or complaints today. 3/27; patient presents for follow-up. We have been using antibiotic ointment and Aquacel Ag to the right plantar foot wound. We have been using Hydrofera Blue with antibiotic ointment under the total contact cast to the left lower extremity. Both wounds are smaller. 01-24-2023 upon evaluation today patient actually appears to be doing better compared to last time I saw her. It has been several weeks since I last saw her but nonetheless I think the total contact casting is doing a good job. Fortunately I do not see any evidence of infection or worsening overall which is great news and in general I do believe that she is moving in the appropriate direction. It has been a very long road but nonetheless I feel like she is nearing the end at least in regard to the left foot and even the right foot looks much better to be perfectly honest. 01-31-2023 upon evaluation today patient appears to be doing well currently in regard to her wounds. She has been require some sharp debridement which I think is probably bone both sides to remove a lot of callus that has built up at this point. I discussed that with her today. Also think total contact cast is doing well for the left foot Emina continue that as well. 4/17; patient presents for  follow-up. We have been doing Hydrofera Blue with antibiotic ointment under the total contact cast to the left lower extremity and Aquacel Ag with antibiotic ointment to the right foot wound. She has been using her offloading heel shoe here. 4/24; patient presents for follow-up. We have been doing Hydrofera Blue with antibiotic ointment under the total contact cast to the left lower extremity and Aquacel Ag with antibiotic ointment to the right foot wound. She has been using her offloading heel shoe here. Wounds are smaller with slough accumulation. 5/1; patient presents for follow-up. We have been doing Hydrofera Blue with antibiotic ointment under the total contact cast to the left lower extremity and Aquacel Ag with antibiotic ointment to the right foot wound. Left foot wound is stable and right foot wound appears smaller. 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 On the left she is using Hydrofera Blue topical antibiotic under a total contact cast, on the right plantar heel gentamicin Aquacel Ag. According to our intake nurse both are making slow but steady improvements. 5/15; patient presents for follow-up. We have been doing Hydrofera Blue with antibiotic ointment under the total contact cast on the left and antibiotic ointment with Aquacel Ag on the right. Unfortunately the left foot wound  has declined in size and appearance. We also do not have the total contact cast available in office today. Patient denies signs of infection. 5/22; patient presents for follow-up. We have been doing Hydrofera Blue and antibiotic ointment to the left foot and Aquacel Ag with antibiotic ointment to the right foot. We took a break from the cast since we did not have it placed at last week. Wounds look stable if not slightly improved. 03-27-2023 patient presents today she has had this For Most 2 Weeks Because She Was in the Hospital and They Did Not T It off. Again That Is Really Not ake Something That She Could  Control but Nonetheless We Are Seeing Her at This Point Today for Reevaluation. We Do Want to Go and See about Reapply the Cast. Again We Had All Set Everything up to Go Ahead and Do This When We Went to Get the Cast Only to Realize That Not Had Come in and We Were out at the Moment. With That Being [place] Spaniel We Will Get Have T Get the Exam before We Can Reapply the T Contact Casting Going Forward. o otal 6/12; patient presents for follow-up. She has been using Hydrofera Blue to the left foot wound and Aquacel Ag to the right foot wound. No cast was available last week placed on the patient so she has been offloading with her surgical shoe. She has no issues or complaints today. 6/19; patient presents for follow-up. We have been using Hydrofera Blue to the left foot wound under the total contact cast and Aquacel Ag to the right wound. She has no issues or complaints today. The wounds are smaller. 6/26; patient presents for follow-up. We have been using Hydrofera Blue to the left foot wound under the total contact cast and Aquacel Ag to the right foot wound. Wounds are smaller. Patient has no issues or complaints today. 7/3; patient presents for follow-up. We have been using Hydrofera Blue to the left foot wound under the total contact cast and Aquacel Ag to the right foot wound. Wounds Appear smaller. No signs of infection. 7/10; patient presents for follow-up. We have been using Hydrofera Blue to the left foot wound under the total contact cast and Aquacel Ag to the right foot wound. Left foot wound is smaller. Right foot wound is stable. 7/17; patient presents for follow-up. We have been using Hydrofera Blue to the left foot under the total contact cast and Aquacel Ag to the right foot wound. She has no issues or complaints today. 7/24; patient presents for follow-up. We have been using Hydrofera Blue to the left foot under the total contact cast and Aquacel Ag to the right foot wound. Unfortunately  she was bit by her dog over the weekend and developed a wound to the right lower leg. She has been keeping the area covered. 7/31; patient presents for follow-up. We have been using Hydrofera Blue to the left foot under the total contact cast and Aquacel Ag to the right foot wound and mupirocin ointment to the right anterior leg wound. She started her oral antibiotics 2 days ago. SONIAH, INZUNZA A ([id number]) [id number] Page 9 of 22 8/7; patient presents for follow-up. We have been using Hydrofera Blue and antibiotic ointment to the left foot under the total contact cast and Aquacel Ag to the right foot wound. Antibiotic ointment to the right anterior leg wound. She completed her oral antibiotics. She has no issues or complaints today. Wounds continue to improve albeit slowly. 8/14;  8/14; patient has 3 wounds 1 on the left plantar heel 1 on the right plantar heel and a small wound on the right anterior lower leg. We have been using Hydrofera Blue in the left heel Aquacel on the right heel. The left heel has been in a total contact cast 8/21; patient presents for follow-up. We have been using Hydrofera Blue to the left foot wound under the total contact cast. T the right heel patient has been o using Aquacel Ag. T the anterior leg wound antibiotic ointment. All wounds have healed. o 8/28; patient presents for follow-up. She has been using her surgical shoe with offloading felt pads. Her wounds have remained closed. 9/11; patient presents for final follow-up to assure that her wounds have remained closed. Unfortunately the left foot wound has opened again. But she has been wearing her Surgical shoe for offloading with felt pad. 07-14-2023 upon evaluation today patient appears to be doing well currently in regard to her wound. She has been tolerating the dressing changes without complication with total contact casting. I am going to be helping take care of her at this point  as she is transferring from Dr. Mikey Bussing to me since Dr. Mikey Bussing is on maternity leave. 07-24-2023 upon evaluation today patient appears to be doing decently well currently in regard to her wound. She is actually showing signs of improvement in regard to the left foot and I feel like this is much better than last week no debridement even necessary today. We may have to perform some slight debridement come next week we will see how things go. Monitor for any signs of worsening overall and obviously if anything changes she should be letting us know. 08-01-2023 upon evaluation today patient appears to be doing excellent in regard to her feet. The right foot/heel area is completely healed she does have some callus buildup I am going to trim this down a little bit I do not want to build up to the point of cracking she is doing well with the urea cream. With regard to the left heel she is doing well with casting I am going to perform some debridement here to clear away before reapplying the cast and I think she is really doing quite well though. 08-07-2023 upon evaluation patient's wound actually showed signs of excellent improvement and actually very pleased with where we stand I do believe that she is making good headway towards complete closure which is great news. 08-14-2023 upon evaluation today patient appears to be doing well currently in regard to her wound. She actually is going require a little bit of debridement but fortunately nothing too significant today actually feel like she is doing much better. This is mainly to remove callus on the little slough and biofilm down to good subcutaneous tissue. In general I think that she is making excellent headway here. 08-21-2023 upon evaluation today patient appears to be doing well currently in regard to her wound which is actually showing signs of significant improvement. I am extremely pleased with where things stand I do not see any signs of infection  at this time which is great news and overall I do think that we are making headway towards complete closure which is also excellent news. 08-30-2023 upon evaluation today patient's wound is showing signs of continued improvement. Fortunately I do not see any evidence of worsening overall and I believe that the patient is making excellent headway towards closure. I am extremely pleased with where we stand. 09-04-2023 upon  evaluation today patient appears to be doing well currently in regard to her foot ulcer. She has been tolerating the dressing changes without complication. Fortunately I do not see any signs of active infection locally or systemically at this time which is great news. No fevers, chills, nausea, vomiting, or diarrhea. 09-11-2023 upon evaluation today patient appears to be doing well currently in regard to her wound on the left foot. I feel like she is actually showing signs of improvement I feel like the collagen is doing a good job. With that being said unfortunately she has an area where there is a spot on the right foot that has not really open but at least there looks like there may have been something that popped off of this area. 09-18-2023 upon evaluation today patient appears to be doing well currently in regard to her wound although to be honest she still continues to have issues here with the wound not healing as rapidly as I would expect. She does a lot of walking on the cast from what I am hearing today and to be honest as I mention to her previously this cast is not exactly appropriate for her based on the size of her leg is technically too small and I think this is causing issues with rubbing and therefore callus buildup that would not otherwise occur with a properly fitting cast. I discussed that with her today and I think that potentially having a offloading shoe would be better specifically a front offloading boot. 09-25-2023 upon evaluation patient's wound is showing  signs of improvement which is great news and in general I do feel like that she is really doing quite well. I do not see any signs of active infection at this time which is excellent and in general I think that we are moving in the right direction here. She did get the offloading boot off of [employer] that working to get a try it is a front off loader which I think should do quite well and we will get a see how that does over the next week. 10-02-2023 upon evaluation today patient's wound actually showing signs of good improvement. I actually feel like it is overall looking better there is still quite a bit of callus but I did go ahead and perform debridement today to clear this away. She in general seems to be doing quite well. 10-09-2023 upon evaluation today patient unfortunately appears to be doing worse in regard to her wound she is showing signs of infection and this is not good. Fortunately I do not see any evidence of deeper wound but at the same time I do feel like there is some area underneath the callus where this has spread again we have previously gotten that healed. I think that she is probably going to need some oral antibiotic therapy. 10-17-2023 upon evaluation today patient unfortunately appears to be doing better with regard to the infection but has a lot more callus buildup. I really do not think that the boot is working out well for her. 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 Either way I do not believe at this point that this is going to be the appropriate way to continue currently. Electronic Signature(s) Signed: 10/17/2023 12:17:52 PM By: [name] PA-C Entered By: [name] on 10/17/2023 09:17:52 Earmon Phoenix A ([id number]) [id number] Page 10 of 22 -------------------------------------------------------------------------------- Physical Exam Details Patient Name: Date of  Service: TASHEBA, SELLARDS 10/17/2023 11:00 A M Medical Record Number: [id number] Patient Account Number: [id number] Date of Birth/Sex: Treating RN: 02/05/1976 (47 y.o. Ginette Pitman Primary Care Provider: [name] Other Clinician: [name] Referring Provider: Treating Provider/Extender: [name] Weeks in Treatment: 59 Constitutional Well-nourished and well-hydrated in no acute distress. Respiratory normal breathing without difficulty. Psychiatric this patient is able to make decisions and demonstrates good insight into disease process. Alert and Oriented x 3. pleasant and cooperative. Notes Upon inspection patient's wound bed actually showed signs of pretty good granulation at this time once I got the callus and necrotic tissue clearway. I did not see any abscesses which was good news and in general I believe there were making pretty good headway here towards closure which is excellent as well. Nonetheless I think she is gena require going back to the total contact casting as I feel like that the boot is not doing any good for her. Again she had been in the cast for so long we have attempted to try something different that I was hopeful would be beneficial for her unfortunately he just does not seem to have done as well as I would have liked of seeing. I do feel like the infection however is improved. Electronic Signature(s) Signed: 10/17/2023 12:22:14 PM By: [name] PA-C Entered By: [name] on 10/17/2023 09:22:14 -------------------------------------------------------------------------------- Physician Orders Details Patient Name: Date of Service: [name] 10/17/2023 11:00 A M Medical Record Number: [id number] Patient Account Number: [id number] Date of Birth/Sex: Treating RN: 06-19-1976 (47 y.o. 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 Bathing/ Shower/ Hygiene May shower; gently cleanse wound with antibacterial soap, rinse and pat dry prior to dressing wounds No tub bath. Off-Loading Open toe surgical shoe - right foot Other: - front offloading boot left foot Medications-Please add to medication list. ntibiotics - start Cipro as directed P.O. A Wound Treatment Wound #12R - Foot Wound Laterality: Plantar, Left, Medial Cleanser: Vashe 5.8 (oz) Every Other Day/30 Days Discharge Instructions: Use vashe 5.8 (oz) as directed Topical: Mupirocin Ointment Every Other Day/30 Days Discharge Instructions: Apply as directed by provider. Prim Dressing: Hydrofera Blue Ready Transfer Foam, 2.5x2.5 (in/in) Every Other Day/30 Days ary Discharge Instructions: Apply Hydrofera Blue Ready to wound bed as directed Secondary Dressing: Gauze Every Other Day/30 Days Discharge Instructions: As directed: dry, moistened with saline or moistened with Dakins Solution Secured With: Medipore T - 62M Medipore H Soft Cloth Surgical T ape ape, 2x2 (in/yd) Every Other Day/30 Days Secured With: Kerlix Roll Sterile or Non-Sterile 6-ply 4.5x4 (yd/yd) Every Other Day/30 Days Discharge Instructions: Apply Kerlix as directed Secured With: [employer],  Latex-free, Size 5, Small-Head / Shoulder / Thigh Every Other Day/30 Days Electronic Signature(s) Signed: 10/17/2023 12:22:29 PM By: [name] Signed: 10/17/2023 3:46:23 PM By: [name] PA-C Entered  By: [name] on 10/17/2023 09:17:09 -------------------------------------------------------------------------------- Problem List Details Patient Name: Date of Service: CARLECIA, CORADO 10/17/2023 11:00 A M Medical Record Number: [id number] Patient Account Number: [id number] Date of Birth/Sex: Treating RN: 1976-08-08 (47 y.o. Ginette Pitman Primary Care Provider: [name] Other Clinician: Referring Provider: Treating Provider/Extender: [name] in Treatment: 51 Active Problems ICD-10 Encounter Code Description Active Date MDM Diagnosis KAILEENA, MINAYA A ([id number]) [id number] Page 12 of 22 E11.621 Type 2 diabetes mellitus with foot ulcer 03/16/2022 No Yes L97.528 Non-pressure chronic ulcer of other part of left foot with other specified 03/16/2022 No Yes severity E11.42 Type 2 diabetes mellitus with diabetic polyneuropathy 03/16/2022 No Yes Z79.4 Long term (current) use of insulin 02/22/2023 No Yes N18.6 End stage renal disease 07/14/2023 No Yes Z99.2 Dependence on renal dialysis 07/14/2023 No Yes L84 Corns and callosities 08/01/2023 No Yes Inactive Problems Resolved Problems ICD-10 Code Description Active Date Resolved Date L97.512 Non-pressure chronic ulcer of other part of right foot with fat layer exposed 03/16/2022 03/16/2022 L97.811 Non-pressure chronic ulcer of other part of right lower leg limited to breakdown of skin 03/16/2022 03/16/2022 W54.0XXA Bitten by dog, initial encounter 05/17/2023 05/17/2023 S81.801A Unspecified open wound, right lower leg, initial encounter 05/17/2023 05/17/2023 W10.272 Type 2 diabetes mellitus with other skin ulcer 05/17/2023 05/17/2023 Electronic Signature(s) Signed: 10/17/2023 11:14:01  AM By: [name] PA-C Entered By: [name] on 10/17/2023 08:14:01 -------------------------------------------------------------------------------- Progress Note Details Patient Name: Date of Service: [name] 10/17/2023 11:00 A M Medical Record Number: [id number] Patient Account Number: [id number] Date of Birth/Sex: Treating RN: [birth year] (47 y.o. Cherilee, Menard, Abbigayle A ([id number]) [id number] Page 13 of 22 Primary Care Provider: [name] Other Clinician: [name] Referring Provider: Treating Provider/Extender: [name] in Treatment: 22 Subjective Chief Complaint Information obtained from Patient Left foot ulcer History of Present Illness (HPI) 01/18/18-She is here for initial evaluation of the left great toe ulcer. She is a poor historian in regards to timeframe in detail. She states approximately 4 weeks ago she lacerated her toe on something in the house. She followed up with her primary care who placed her on Bactrim and ultimately a second dose of Bactrim prior to coming to wound clinic. She states she has been treating the toe with peroxide, Betadine and a Band-Aid. She did not check her blood sugar this morning but checked it yesterday morning it was 327; she is unaware of a recent A1c and there are no current records. She saw Dr. she would've orthopedics last week for an old injury to the left ankle, she states he did not see her toe, nor did she bring it to his attention. She smokes approximately 1 pack cigarettes a day. Her social situation is concerning, she arrives this morning with her mother who appears extremely intoxicated/under the influence; her mother was asked to leave the room and be monitored by the patient's grandmother. The patient's aunt then accompanied the patient and the room throughout the rest of the appointment. We had a lengthy discussion regarding the deleterious effects  of uncontrolled hyperglycemia and smoking as it relates to wound healing and overall health. She was strongly encouraged to decrease her smoking and get her diabetes under better control. She states she is currently on a diet and has cut down her [facility] consumption. The left toe is erythematous, macerated and slightly edematous with malodor present. The edema in her left foot is below her baseline, there  is no erythema streaking. We will treat her with Santyl, doxycycline; we have ordered and xray, culture and provided a Peg assist surgical shoe and cultured the wound. 01/25/18-She is here in follow-up evaluation for a left great toe ulcer and presents with an abscess to her suprapubic area. She states her blood sugars remain elevated, feeling "sick" and if levels are below 250, but she is trying. She has made no attempt to decrease her smoking stating that we "can't take away her food in her cigarettes". She has been compliant with offloading using the PEG assist you. She is using Santyl daily. the culture obtained last week grew staph aureus and Enterococcus faecalis; continues on the doxycycline and Augmentin was added on Monday. The suprapubic area has erythema, no femoral variation, purple discoloration, minimal induration, was accessed with a cotton tip applicator with sanguinopurulent drainage, this was cultured, I suspect the current antibiotic treatment will cover and we will not add anything to her current treatment plan. She was advised to go to urgent care or ER with any change in redness, induration or fever. 02/01/18-She is here in follow-up evaluation for left great toe ulcers and a new abdominal abscess from last week. She was able to use packing until earlier this week, where she "forgot it was there". She states she was feeling ill with GI symptoms last week and was not taking her antibiotic. She states her glucose levels have been predominantly less than 200, with occasional  levels between 200-250. She thinks this was contributing to her GI symptoms as they have resolved without intervention. There continues to be significant laceration to left toe, otherwise it clinically looks stable/improved. There is now less superficial opening to the lateral aspect of the great toe that was residual blister. We will transition to [facility] to all wounds, she will continue her Augmentin. If there is no change or deterioration next week for reculture. 02/08/18-She is here in follow-up evaluation for left great toe ulcer and abdominal ulcer. There is an improvement in both wounds. She has been wrapping her left toe with coban, not by our direction, which has created an area of discoloration to the medial aspect; she has been advised to NOT use coban secondary to her neuropathy. She states her glucose levels have been high over this last week ranging from 200-350, she continues to smoke. She admits to being less compliant with her offloading shoe. We will continue with same treatment plan and she will follow-up next week. 02/15/18-She is here in follow-up evaluation for left great toe ulcer and abdominal ulcer. The abdominal ulcer is epithelialized. The left great toe ulcer is improved and all injury from last week using the Coban wrap is resolved, the lateral ulcer is healed. She admits to noncompliance with wearing offloading shoe and admits to glucose levels being greater than 300 most of the week. She continues to smoke and expresses no desire to quit. There is one area medially that probes deeper than it has historically, erythema to the toe and dorsal foot has consistently waxed and waned. There is no overt signs of cellulitis or infection but we will culture the wound for any occult infection given the new area of depth and erythema. We will hold off on sensitivities for initiation of antibiotic therapy. 02/22/18-She is here in follow up evaluation for left great toe ulcer. There  is overall significant improvement in both wound appearance, erythema and edema with changes made last week. She was not initiated on antibiotic therapy.  Culture obtained last week showed oxacillin sensitive staph aureus, sensitive to clindamycin. Clindamycin has been called into the pharmacy but she has been instructed to hold off on initiation secondary to overall clinical improvement and her history of antibiotic intolerance. She has been instructed to contact the clinic with any noted changes/deterioration and the wound, erythema, edema and/or pain. She will follow-up next week. She continues to smoke and her glucose levels remain elevated >250; she admits to compliance with offloading shoe 03/01/18 on evaluation today patient appears to be doing fairly well in regard to her left first toe ulcer. She has been tolerating the dressing changes with the [facility] Dressing without complication and overall this has definitely showed signs of improvement according to records as well is what the patient tells me today. I'm very pleased in that regard. She is having no pain today 03/08/18 She is here for follow up evaluation of a left great toe ulcer. She remains non-compliant with glucose control and smoking cessation; glucose levels consistently >200. She states that she got new shoe inserts/peg assist. She admits to compliance with offloading. Since my last evaluation there is significant improvement. We will switch to prisma at this time and she will follow up next week. She is noted to be tachycardic at this appointment, heart rate 120s; she has a history of heart rate 70-130 according to our records. She admits to extreme agitation r/t personal issues; she was advised to monitor her heartrate and contact her physician if it does not return to a more normal range (<100). She takes cardizem twice daily. 03/15/18-She is here in follow-up evaluation for left great toe ulcer. She remains noncompliant with  glucose control and smoking cessation. She admits to compliance with wearing offloading shoe. The ulcer is improved/stable and we will continue with the same treatment plan and she will follow-up next week 03/22/18-She is here for evaluation for left great toe ulcer. There continues to be significant improvement despite recurrent hyperglycemia (over 500 yesterday) and she continues to smoke. She has been compliant with offloading and we will continue with same treatment plan and she will follow-up next week. 03/29/18-She is here for evaluation for left great toe ulcer. Despite continuing to smoke and uncontrolled diabetes she continues to improve. She is compliant with offloading shoe. We will continue with the same treatment plan and she will follow-up next week 04/05/18- She is here in follow up evaluation for a left great toe ulcer; she presents with small pustule to left fifth toe (resembles ant bite). She admits to compliance with wearing offloading shoe; continues to smoke or have uncontrolled blood glucose control. There is more callus than usual with evidence of bleeding; she denies known trauma. 04/12/18-She is here for evaluation of left great toe ulcer. Despite noncompliance with glycemic control and smoking she continues to make improvement. She continues to wear offloading shoe. The pustule, that was identified last week, to the left fifth toe is resolved. She will follow-up in 2 weeks 05/03/18-she is seen in follow-up evaluation for a left great toe ulcer. She is compliant with offloading, otherwise noncompliant with glycemic control and smoking. She has plateaued and there is minimal improvement noted. We will transition to [facility], replaced the insert to her surgical shoe and she will follow-up in one week 05/10/18- She is here in follow up evaluation for a left great toe ulcer. It appears stable despite measurement change. We will continue with same treatment plan and follow up next  week. 05/24/18-She  is seen in follow-up evaluation for a left great toe ulcer. She remains compliant with offloading, has made significant improvement in her diet, decreasing the amount of sugar/soda. She said her recent A1c was 10.9 which is lower than. She did see a diabetic nutritionist/educator yesterday. She continues to smoke. We will continue with the same treatment plan and she'll follow-up next week. 05/31/18- She is seen in follow-up evaluation for left great toe ulcer. She continues to remain compliant with offloading, continues to make improvement in her diet, increasing her water and decreasing the amount of sugar/soda. She does continue to smoke with no desire to quit. We will apply Prisma to the depth and Hydrofera Blue over. We have not received insurance authorization for oasis. She will follow up next week. 06/07/18-She is seen in follow-up evaluation for left great toe ulcer. It has stalled according to today's measurements although base appears stable. She says she saw a diabetic educator yesterday; her average blood sugars are less than 300 which is an improvement for her. She continues to smoke and states "that's my next step" She continues with water over soda. We will order for xray, culture and reinstate ace wrap compression prior to placing apligraf for next week. She is voicing no complaints or concerns. Her dressing will change to iodoflex over the next week in preparation for apligraf. 06/14/18-She is seen in follow-up evaluation for left great toe ulcer. Plain film x-ray performed last week was negative for osteomyelitis. Wound culture obtained last week grew strep B and OSSA; she is initiated on keflex and cefdinir today; there is erythema to the toe which could be from ace wrap compression, she has a history of wrapping too tight and has has been encouraged to maintain ace wraps that we place today. We will hold off on application of apligraf today, INELL, CASTLEBERRY A  ([id number]) [id number] Page 14 of 22 will apply next week after antibiotic therapy has been initiated. She admits today that she has resumed taking a shower with her foot/toe submerged in water, she has been reminded to keep foot/toe out of the bath water. She will be seen in follow up next week 06/21/18-she is seen in follow-up evaluation for left great toe ulcer. She is tolerating antibiotic therapy with no GI disturbance. The wound is stable. Apligraf was applied today. She has been decreasing her smoking, only had 4 cigarettes yesterday and 1 today. She continues being more compliant in diabetic diet. She will follow-up next week for evaluation of site, if stable will remove at 2 weeks. 06/28/18- She is here in follow up evalution. Apligraf was placed last week, she states the dressing fell off on Tuesday and she was dressing with hydrofera blue. She is healed and will be discharged from the clinic today. She has been instructed to continue with smoking cessation, continue monitoring glucose levels, offloading for an additional 4 weeks and continue with hydrofera blue for additional two weeks for any possible microscopic opening. Readmission: 08/07/18 on evaluation today patient presents for reevaluation concerning the ulcer of her right great toe. She was previously discharged on 06/28/18 healed. Nonetheless she states that this began to show signs of drainage she subsequently went to her primary care provider. Subsequently an x-ray was performed on 08/01/18 which was negative. The patient was also placed on antibiotics at that time. Fortunately they should have been effective for the infection. Nonetheless she's been experiencing some improvement but still has a lot of drainage coming from the wound itself. 08/14/18  on evaluation today patient's wound actually does show signs of improvement in regard to the erythema at this point. She has completed the antibiotics. With  that being said we did discuss the possibility of placing her in a total contact cast as of today although I think that I may want to give this just a little bit more time to ensure nothing recurrence as far as her infection is concerned. I do not want to put in the cast and risk infection at that time if things are not completely resolved. With that being said she is gonna require some debridement today. 08/21/18 on evaluation today patient actually appears to be doing okay in regard to her toe ulcer. She's been tolerating the dressing changes without complication. With that being said it does appear that she is ready and in fact I think it's appropriate for [place] to go ahead and initiate the total contact cast today. Nonetheless she will require some sharp debridement to prepare the wound for application. Overall I feel like things have been progressing well but we do need to do something to get this to close more readily. 08/24/18 patient seen today for reevaluation after having had the total contact cast applied on Tuesday. She seems to have done very well the wound appears to be doing great and overall I'm pleased with the progress that she's made. There were no abnormal areas of rubbing from the cast on her lower extremity. 08/30/18 on evaluation today patient actually appears to be completely healed in regard to her plantar toe ulcer. She tells me at this point she's been having a lot of issues with the cast. She almost fell a couple of times the state shall the step of her dog a couple times as well. This is been a very frustrating process for her other nonetheless she has completely healed the wound which is excellent news. Overall there does not appear to be the evidence of infection at this time which is great news. 09/11/18 evaluation today patient presents for follow-up concerning her great toe ulcer on the left which has unfortunately reopened since I last saw her which was only a couple of  weeks ago. Unfortunately she was not able to get in to get the shoe and potentially the AFO that's gonna be necessary due to her left foot drop. She continues with offloading shoe but this is not enough to prevent her from reopening it appears. When we last had her in the total contact cast she did well from a healing standpoint but unfortunately the wound reopened as soon as she came out of the cast within just a couple of weeks. 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 This is also part of the reason why she falls on a regular basis. I just do not believe that is gonna be safe for [place] to reinitiate the total contact cast as last time we had this on she fell 3 times one week which is definitely not normal for her. 09/18/18 upon evaluation today the patient actually appears to be doing about the same in regard to her toe ulcer. She did not contact [employer] as I asked her to even though I had given her the prescription. In fact she actually states that she has no  idea where the prescription is. She did apparently call [employer] and they told her that all she needed to do was bring the prescription in order to be able to be seen and work on getting the AFO for her left foot. With all that being said she still does not have an appointment and I'm not sure were things stand that regard. I will give her a new prescription today in order to contact them to get this set up. 09/25/18 on evaluation today patient actually appears to be doing about the same in regard to her toes ulcer. She does have a small areas which seems to have a lot of callous buildup around the edge of the wound which is going to need sharp debridement today. She still is waiting to be scheduled for evaluation with [facility] for possibility of an  AFO. She states there supposed to call her tomorrow to get this set up. Unfortunately it does appear that her foot specifically the toe area is showing signs of erythema. There does not appear to be any systemic infection which is in these good news. 10/02/18 on evaluation today patient actually appears to be doing about the same in regard to her toe ulcer. This really has not done too well although it's not significantly larger it's also not significantly smaller. She has been tolerating the dressing changes without complication. She actually has her appointment with [facility] and [facility] tomorrow to hopefully be measured for obtaining and AFO splint. I think this would be helpful preventing this from reoccurring. We had contemplated starting the cast this week although to be honest I am reluctant to do that as she's been having nausea, vomiting, and seizure activity over the past three days. She has a history of seizures and have been told is nothing that can be done for these. With that being said I do believe that along with the seizures have the nausea vomiting which upon further questioning doesn't seem to be the normal for her and makes me concerned for the possibility of infection or something else going on. I discussed this with the patient and her mother during the office visit today. I do not feel the wound is effective but maybe something else. The responses this was "this just happens to her at times and we don't know why". They did not seem to be interested in going to the hospital to have this checked out further. 10/09/18 on evaluation today patient presents for follow-up concerning her ongoing toe ulcer. She has been tolerating the dressing changes without complication. Fortunately there does not appear to be any evidence of infection which is great news however I do think that the patient would benefit from going ahead for with the total contact cast. She's actually in a wheelchair  today she tells me that she will use her walker if we initiate the cast. I was very specific about the fact that if we were gonna do the cast I wanted to make sure that she was using the walker in order to prevent any falls. She tells me she does not have stairs that she has to traverse on a regular basis at her home. She has not had any seizures since last week again that something that happens to her often she tells me she did talk to [employer] and they said that it may take up to three weeks to get the brace approved for her. Hopefully that will not take that long but nonetheless in the meantime I  do think the cast could be of benefit. 10/12/18 on evaluation today patient appears to be doing rather well in regard to her toe ulcer. It's just been a few days and already this is significantly improved both as far as overall appearance and size. Fortunately there's no sign of infection. She is here for her first obligatory cast change. 10/19/18 Seen today for follow up and management of left great toe ulcer. Wound continues to show improvement. Noted small open area with seroussang drainage with palpation. Denies any increased pain or recent fevers during visit. She will continue calcium alginate with offloading shoe. Denies any questions or concerns during visit. 10/26/18 on evaluation today patient appears to be doing about the same as when I last saw her in regard to her wound bed. Fortunately there does not appear to be any signs of infection. Unfortunately she continues to have a breakdown in regard to the toe region any time that she is not in the cast. It takes almost no time at all for this to happen. Nonetheless she still has not heard anything from the brace being made by [employer] as to when exactly this will be available to her. Fortunately there is no signs of infection at this time. 10/30/18 on evaluation today patient presents for application of the total contact cast as we just received him this  morning. Fortunately we are gonna be able to apply this to her today which is great news. She continues to have no significant pain which is good news. Overall I do feel like things have been improving while she was the cast is when she doesn't have a cast that things get worse. She still has not really heard anything from [facility] regarding her brace. 11/02/18 upon evaluation today patient's wound already appears to be doing significantly better which is good news. Fortunately there does not appear to be any signs of infection also good news. Overall I do think the total contact cast as before is helping to heal this area unfortunately it's just not gonna likely keep the area closed and healed without her getting her brace at least. Again the foot drop is a significant issue for her. GEANIE, GLASBY A ([id number]) [id number] Page 15 of 22 11/09/18 on evaluation today patient appears to be doing excellent in regard to her toe ulcer which in fact is completely healed. Fortunately we finally got the situation squared away with the paperwork which was needed to proceed with getting her brace approved by Medicaid. I have filled that out unfortunately that information has been sent to the orthopedic office that I worked at 2 1/2 years ago and not tired Current wound care measures. Fortunately she seems to be doing very well at this time. 11/23/18 on evaluation today patient appears to be doing More Poorly Compared to Last Time I Saw Her. At [facility] She Had Completely Healed. Currently she is continuing to have issues with reopening. She states that she just found out that the brace was approved through Medicaid now she just has to go get measured in order to have this fitted for her and then made. Subsequently she does not have an appointment for this yet that is going to complicate things we obviously cannot put her back in the cast if we do not have everything measured because  they're not gonna be able to measure her foot while she is in the cast. Unfortunately the other thing that I found out today as well is that she was  in the hospital over the weekend due to having a heroin overdose. Obviously this is unfortunate and does have me somewhat worried as well. 11/30/18 on evaluation today patient's toe ulcer actually appears to be doing fairly well. The good news is she will be getting her brace in the shoes next week on Wednesday. Hopefully we will be able to get this to heal without having to go back in the cast however she may need the cast in order to get the wound completely heal and then go from there. Fortunately there's no signs of infection at this time. 12/07/18 on evaluation today patient fortunately did receive her brace and she states she could tell this definitely makes her walk better. With that being said she's been having issues with her toe where she noticed yesterday there was a lot of tissue that was loosing off this appears to be much larger than what it was previous. She also states that her leg has been read putting much across the top of her foot just about the ankle although this seems to be receiving somewhat. The total area is still red and appears to be someone infected as best I can tell. She is previously taken Bactrim and that may be a good option for her today as well. We are gonna see what I wound culture shows as well and I think that this is definitely appropriate. With that being said outside of the culture I still need to initiate something in the interim and that's what I'm gonna go ahead and select Bactrim is a good option for her. 12/14/18 on evaluation today patient appears to be doing better in regard to her left great toe ulcer as compared to last week's evaluation. There's still some erythema although this is significantly improved which is excellent news. Overall I do believe that she is making good progress is still gonna take some  time before she is where I would like her to be from the standpoint of being able to place her back into the total contact cast. Hopefully we will be where we need to be by next week. 12/21/18 on evaluation today patient actually appears to be doing poorly in regard to her toe ulcer. She's been tolerating the dressing changes without complication. Fortunately there's no signs of systemic infection although she does have a lot of drainage from the toe ulcer and this does seem to be causing some issues at this point. She does have erythema on the distal portion of her toe that appears to be likely cellulitis. 12/28/18 on evaluation today patient actually appears to be doing a little better in my pinion in regard to her toe ulcer. With that being said she still does have some evidence of infection at this time and for her culture she had both E. coli as well as enterococcus as organisms noted on evaluation. For that reason I think that though the Keflex likely has treated the E. coli rather well this has really done nothing for the enterococcus. We are going to have to initiate treatment for this specifically. 01/04/19 on evaluation today patient's toe actually appears to be doing better from the standpoint of infection. She currently would like to see about putting the cash back on I think that this is appropriate as long as she takes care of it and keeps it from getting wet. She is gonna have some drainage we can definitely pass this up with Drawtex and alginate to try to prevent as much drainage  as possible from causing the problems. With that being said I do want to at least try her with the cast between now and Tuesday. If there any issues we can't continue to use it then I will discontinue the use of the cast at that point. 01/08/19 on evaluation today patient actually appears to be doing very well as far as her foot ulcer specifically the great toe on the left is concerned. She did have an area of  rubbing on the medial aspect of her left ankle which again is from the cast. Fortunately there's no signs of infection at this point in this appears to be a very slight skin breakdown. The patient tells me she felt it rubbing but didn't think it was that bad. Fortunately there is no signs of active infection at this time which is good news. No fevers, chills, nausea, or vomiting noted at this time. 01/15/19 on evaluation today patient actually appears to be doing well in regard to her toe ulcer. Again as previous she seems to do well and she has the cast on which indicates to me that during the time she doesn't have a cast on she's putting way too much pressure on this region. Obviously I think that's gonna be an issue as with the current national emergency concerning the Covid-19 Virus it has been recommended that we discontinue the use of total contact casting by the chief medical officer of our company, Dr. Maurine Minister. The reasoning is that if a patient becomes sick and cannot come into have the cast removed they could not just leave this on for an additional two weeks. Obviously the hospitals also do not want to receive patient's who are sick into the emergency department to potentially contaminate the region and spread the Covid-19 Virus among other sick individuals within the hospital system. Therefore at this point we are suspending the use of total contact cast until the current emergency subsides. This was all discussed with the patient today as well. 01/22/19 on evaluation today patient's wound on her left great toe appears to be doing slightly worse than previously noted last week. She tells me that she has been on this quite a bit in fact she tells me she's been awake for 38 straight hours. This is due to the fact that she's having to care for grandparents because nobody else will. She has been taking care of them for five the last seven days since I've seen her they both have dementia his is from  a stroke and her grandmother's was progressive. Nonetheless she states even her mom who knows her condition and situation has only help two of those days to take care of them she's been taking care of the rest. Fortunately there does not appear to be any signs of active infection in regard to her toe at this point although obviously it doesn't look as good as it did previous. I think this is directly related to her not taking off the pressure and friction by way of taking things easy. Though I completely understand what's going on. 01/29/19 on evaluation today patient's tools are actually appears to be showing some signs of improvement today compared to last week's evaluation as far as not necessarily the overall size of the wound but the fact that she has some new skin growth in between the two ends of the wound opening. Overall I feel like she has done well she states that she had a family member give her what sounds to  be a CAM walker boot which has been helpful as well. 02/05/19 on evaluation today patient's wound bed actually appears to be doing significantly better in regard to her overall appearance of the size of the wound. With that being said she is still having an issue with offloading efficiently enough to get this to close. Apparently there is some signs of infection at this point as well unfortunately. Previously she's done well of Augmentin I really do not see anything that needs to be culture currently but there theme and cellulitis of the foot that I'm seeing I'm gonna go ahead and place her on an antibiotic today to try to help clear this up. 02/12/2019 on evaluation today patient actually appears to be doing poorly in regard to her overall wound status. She tells me she has been using her offloading shoe but actually comes in today wearing her tennis shoe with the AFO brace. Again as I previously discussed with her this is really not sufficient to allow the area to heal appropriately.  Nonetheless she continues to be somewhat noncompliant and I do wonder based on what she has told my nurse in the past as to whether or not a good portion of this noncompliance may be recreational drug and alcohol related. She has had a history of heroin overdose and this was fairly recently in the past couple of months that have been seeing her. Nonetheless overall I feel like her wound looks significantly worse today compared to what it was previous. She still has significant erythema despite the Augmentin I am not sure that this is an appropriate medication for her infection I am also concerned that the infection may have gone down into her bone. 02/19/19 on evaluation today patient actually appears to be doing about the same in regard to her toe ulcer. Unfortunately she continues to show signs of bone exposure and infection at this point. There does not appear to be any evidence of worsening of the infection but I'm also not really sure that it's getting significantly better. She is on the Augmentin which should be sufficient for the Staphylococcus aureus infection that she has at this point. With that being said she may need IV antibiotics to more appropriately treat this. We did have a discussion today about hyperbaric option therapy. 02/28/19 on evaluation today patient actually appears to be doing much worse in regard to the wound on her left great toe as compared to even my previous evaluation last week. Unfortunately this seems to be training in a pretty poor direction. Her toe was actually now starting to angle laterally and I can actually see the entire joint area of the proximal portion of the digit where is the distal portion of the digit again is no longer even in contact with the joint line. Unfortunately there's a lot more necrotic tissue around the edge and the toe appears to be showing signs of becoming gangrenous in my pinion. I'm very concerned about were things stand at this point. She  did see infectious disease and they are planning to send in a prescription for Sivextro for her and apparently this has been approved. With that being said I don't think she should avoid taking this but at the same time I'm not sure that it's gonna be sufficient to save her toe at this point. She tells me that she still having to care for grandparents which I think is putting quite a bit of strain on her foot and specifically Schicker, Tzirel A (  [id number]) [id number] Page 16 of 22 the total area and has caused this to break down even to a greater degree than would've otherwise been expected. 03/05/19 on evaluation today patient actually appears to be doing quite well in regard to her toe all things considering. She still has bone exposed but there appears to be much less your thing on overall the appearance of the wound and the toe itself is dramatically improved. She still does have some issues currently obviously with infection she did see vascular as well and there concerned that her blood flow to the toad. For that reason they are setting up for an angiogram next week. 03/14/19 on evaluation today patient appears to be doing very poor in regard to her toe and specifically in regard to the ulceration and the fact that she's starting to notice the toe was leaning even more towards the lateral aspect and the complete joint is visible on the proximal aspect of the joint. Nonetheless she's also noted a significant odor and the tip of the toe is turning more dark and necrotic appearing. Overall I think she is getting worse not better as far as this is concerned. For that reason I am recommending at this point that she likely needs to be seen for likely amputation. READMISSION 03/19/2021 This is a patient that we cared for in this clinic for a prolonged period of time in 2019 and 2020 with a left foot and left first toe wound. I believe she ultimately became infected and  underwent a left first toe amputation. 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 Excell Seltzer. In December 2021 she had an ulcer on her right great toe as well as the fourth and fifth toes. 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 She also had an angiogram at that time and underwent angioplasty of the right anterior tibial artery. In any case she claims that the wound on the right foot is closed I did not look at this today which was probably an oversight although I think that should be done next week. After her surgery she developed a dehiscence but I do not see any follow-up of this. According to Dr. Bernette Redbird last review that she was out of the area being cared for by another physician but recently came back to his attention. 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 A culture of this area showed E. coli apparently before she came back to see Dr. Excell Seltzer she was supposed to be receiving antibiotics but she did not really take them. Nor is she offloading this area at all. Finally her last hemoglobin A1c listed in epic was in March 2022 at 14.1 she says things are a lot better since then although I am not sure. 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 She had this wound at the time. During this admission she had E. coli sepsis a TEE was negative. 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 She is still a heavy cigarette smoker. She has a history of bipolar disorder seizure disorder ABI in our clinic was 1.05 6/1; left midfoot in the setting of a TMA done previously. Round  circular wound  with a "knuckle" of protruding tissue. The problem is that the knuckle was not attached to any of the surrounding granulation and this probed proximally widely I removed a large portion of this tissue. This wound goes with considerable undermining laterally. I do not feel any bone there was no purulence but this is a deep wound. 6/8; in spite of the debridement I did last week. She arrives with a wound looking exactly the same. A protruding "knuckle" of tissue nonadherent to most of the surrounding tissue. There is considerable depth around this from 6-12 o'clock at 2.7 cm and undermining of 1 cm. This does not look overtly infected and the x- ray I did last week was negative for any osseous abnormalities. We have been using silver collagen 6/15; deep tissue culture I did last week showed moderate staph aureus and moderate Pseudomonas. This will definitely require prolonged antibiotic therapy. The pathology on the protuberant area was negative for malignancy fungus etc. 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 We have been using silver collagen. I am going to be prescribing Levaquin for 2 weeks. Her CT scan of the foot is down for 7/5 6/22; CT scan of the foot on 7 5. She says she has hardware in the left leg from her previous fracture. She is on the Levaquin for the deep tissue culture I did that showed methicillin sensitive staph aureus and Pseudomonas. I gave her a 2-week supply and she will have another week. She arrives in clinic today with the same protuberant tissue however this is nonadherent to the tissue surrounding it. I am really at a loss to explain this unless there is underlying deep tissue infection 6/29; patient presents for 1 week follow-up. She has been using collagen to the wound bed. She reports taking her antibiotics as prescribed.She has no complaints or issues today. She denies signs of infection. 7/6; patient  presents for one week followup. She has been using collagen to the wound bed. She states she is taking Levaquin however at times she is not able to keep it down. She denies signs of infection. 7/13; patient presents for 1 week follow-up. She has been using silver alginate to the wound bed. She still has nausea when taking her antibiotics. She denies signs of infection. 7/20; patient presents for 1 week follow-up. She has been using silver alginate with gentamicin cream to the wound bed. She denies any issues and has no complaints today. She denies signs of infection. 7/27; patient presents for 1 week follow-up. She continues to use silver alginate with gentamicin cream to the wound bed. She reports starting her antibiotics. She has no issues or complaints. Overall she reports stability to the wound. 8/3; patient presents for 1 week follow-up. She has been using silver alginate with gentamicin cream to the wound bed. She reports completing all antibiotics. She has no issues or complaints today. She denies signs of infection. 8/17; patient presents for 2-week follow-up. He is to use silver alginate to the wound bed. She has no issues or complaints today. She denies signs of infection. She reports her pain has improved in her foot since last clinic visit 8/24; patient presents for 1 week follow-up. She continues to use silver alginate to the wound bed. She has no issues or complaints. She denies signs of infection. Pain is stable. 9/7; patient presents for follow-up. She missed her last week appointment due to feeling ill. She continues to use  silver alginate. She has a new wound to the right lower extremity that is covered in eschar. She states It occurred over the past week and has no idea how it started. She currently denies signs of infection. 9/14; patient presents for follow-up. T the left foot wound she has been using gentamicin cream and silver alginate. T the right lower extremity wound she  has o o been keeping this covered and has not obtain Santyl. 9/21; patient presents for follow-up. She reports using gentamicin cream and silver alginate to the left foot and Santyl to the right lower extremity wound. She has no issues or complaints today. She denies signs of infection. 9/28; patient presents for follow-up. She reports a new wound to her right heel. She states this occurred a few days ago and is progressively gotten worse. She has been trying to clean the area with a Q-tip and Santyl. She reports stability in the other 2 wounds. She has been using gentamicin cream and silver alginate to the left foot and Santyl to the right lower extremity wound. 10/12; patient presents for follow-up. She reports improvement to the wound beds. She is seeing vein and vascular to discuss the potential of a left BKA. She states they are going to do an arteriogram. She continues to use silver alginate with dressing changes to her wounds. 11/2; patient presents for follow-up. She states she has not been doing dressing changes to the wound beds. She states she is not able to offload the areas. She reports chronic pain to her left foot wound. 11/9; patient presents for follow-up. She came in with only socks on. She states she forgot to put on shoes. It is unclear if she is doing any dressing changes. She currently denies systemic signs of infection. 11/16; patient presents for follow-up. She came again only with socks on. She states she does not wear shoes ever. It is unclear if she does dressing changes. She currently denies systemic signs of infection. 11/23; patient presents for follow-up. She wore her shoes today. It still unclear exactly what dressing she is using for each wound but she did states she obtained Dakin's solution and has been using this to the left foot wound. She currently denies signs of infection. 11/30; patient presents for follow-up. She has no issues or complaints today. She  currently denies signs of infection. ALENI, BENTSEN A ([id number]) [id number] Page 17 of 22 12/7; patient presents for follow-up. She has no issues or complaints today. She has been using Hydrofera Blue to the right heel wound and Dakin solution to the left foot wound. Her right anterior leg wound is healed. She currently denies signs of infection. 12/14; patient presents for follow-up. She has been using Hydrofera Blue to the right heel and Dakin's to the left foot wounds. She has no issues or complaints today. She denies signs of infection. 12/21; patient presents for follow-up. She reports using Hydrofera Blue to the right heel and Dakin's to the left foot wound. She denies signs of infection. 12/28; patient presents for follow-up. She continues to use Dakin's to the left foot wound and Hydrofera Blue to the right heel wound. She denies signs of infection. 1/4; patient presents for follow-up. She has no issues or complaints today. She denies signs of infection. 1/11; patient presents for follow-up. It is unclear if she has been dressing these wounds over the past week. She currently denies signs of infection. 1/18; patient presents for follow-up. She states she has been using Dakin's wet-to-dry  dressings to the left foot. She has been using Hydrofera Blue to the right foot foot wound. She states that the anterior right leg wound has reopened and draining serous fluid. She denies signs of infection. 1/25; patient presents for follow-up. She has no issues or complaints today. 2/1; patient presents for follow-up. She has no issues or complaints today. She denies signs of infection. 2/8; patient presents for follow-up. She has lost her surgical shoes. She did not have a dressing to the right heel wound. She currently denies signs of infection. 2/15; patient presents for follow-up. She reports more pain to the right heel today. She denies purulent drainage Or  fever/chills 2/22; patient presents for follow-up. She reports taking clindamycin over the past week. She states that she continues to have pain to her right heel. She reports purulent drainage. Readmission 03/16/2022 Ms. Movita Ohrt is a 47 year old female with a past medical history of type 2 diabetes, osteomyelitis to her feet, chronic systolic heart failure and bipolar disorder that presents to the clinic for bilateral feet wounds and right lower extremity wound. She was last seen in our clinic on 12/15/2021. At that time she had purulent drainage coming out of her right plantar foot and I recommended she go to the ED. She states she went to [facility] and has been there for the past 3 months. I cannot see the records. She states she had OR debridement and was on several weeks of IV antibiotics while inpatient. Since discharge she has not been taking care of the wound beds. She had nothing on her feet other than socks today. She currently denies signs of infection. 5/31; patient presents for follow-up. She has been using Dakin's wet-to-dry dressings to the wound beds on her feet bilaterally and antibiotic ointment to the right anterior leg wound. She had a wound culture done at last clinic visit that showed moderate Pseudomonas aeruginosa sensitive to ciprofloxacin. She currently denies systemic signs of infection. 6/14; patient presents for follow-up. She received Keystone 5 days ago and has been using this on the wound beds. She states that last week she had to go to the hospital because she had increased warmth and erythema to the right foot. She was started on 2 oral antibiotics. She states she has been taking these. She currently denies systemic signs of infection. She has no issues or complaints today. 6/21; patient presents for follow-up. She states she has been using Keystone antibiotics to the wound beds. She has no issues or complaints today. She denies signs of  infection. 6/28; patient presents for follow-up. She has been using Keystone antibiotics to the wound beds. She has no issues or complaints today. 7/12; patient presents for follow-up. Has been using Keystone antibiotics to the wound beds with calcium alginate. She has no issues or complaints today. She never followed up with her orthopedic surgeon who did the OR debridement to the right foot. We discussed the total contact cast for the left foot and patient would like to do this next week. 7/19; patient presents for follow-up. She has been using Keystone antibiotics with calcium alginate to the wound beds. She has no issues or complaints today. Patient is in agreement to do the total contact cast of the left foot today. She knows to return later this week for the obligatory cast change. 05-13-2022 upon evaluation today patient's wound which she has the cast of the left leg actually appears to be doing significantly better. Fortunately I do not see any  signs of active infection locally or systemically which is great news and overall I am extremely pleased with where we stand currently. 7/26; patient presents for follow-up. She has a cast in place for the past week. She states it irritated her shin. Other than that she tolerated the cast well. She states she would like a break for 1 week from the cast. We have been using Keystone antibiotic and Aquacel to both wound beds. She denies signs of infection. 8/2; patient presents for follow-up. She has been using Keystone and Aquacel to the wound beds. She denies any issues and has no complaints. She is agreeable to have the cast placed today for the left leg. 06-03-2022 upon evaluation today patient appears to be doing well with regard to her wound she saw some signs of improvement which is great news. Fortunately I do not see any evidence of active infection locally or systemically at this time which is great news. No fevers, chills, nausea, vomiting,  or diarrhea. 8/16; patient presents for follow-up. She has no issues or complaints today. We have been using Keystone and Aquacel to the wound beds. The left lower extremity is in a total contact cast. She is tolerated this well. 8/23; patient presents for follow-up. She has had the total contact cast on the left leg for the past week. Unfortunately this has rubbed and broken down the skin to the medial foot. She currently denies signs of infection. She has been using Keystone antibiotic to the right plantar foot wound. 8/30; patient presents for follow-up. We have held off on the total contact cast for the left leg for the past week. Her wound on the left foot has improved and the previous surrounding breakdown of skin has epithelialized. She has been using Keystone antibiotic to both wound beds. She has no issues or complaints today. She denies signs of infection. 9/6; patient presents for follow-up. She has ordered her's Keystone antibiotic and this is arriving this week. She has been doing Dakin's wet-to-dry dressings to the wound beds. She denies signs of infection. She is agreeable to the total contact cast today. 9/13; patient presents for follow-up. She states that the cast caused her left leg shin to be sore. She would like to take a break from the cast this week. She has been using Keystone antibiotic to the right plantar foot wound. She denies signs of infection. 9/20; patient presents for follow-up. She has been using Keystone antibiotics to the wound beds with calcium alginate to the right foot wound and Hydrofera Blue to the left foot wound. She is agreeable to having the cast placed today. She has been approved for Apligraf and we will order this for next clinic visit. 9/27; patient presents for follow-up. We have been using Keystone antibiotic with Hydrofera Blue to the left foot wound under a total contact cast. T the right o foot wound she has been using Keystone antibiotic and  calcium alginate. She declines a total contact cast today. Apligraf is available for placement and she ANAIRA, LAURENZO A ([id number]) [id number] Page 18 of 22 would like to proceed with this. 07-28-2022 upon evaluation today patient appears to be doing well currently in regard to her wound. She is actually showing signs of significant improvement which is great news. Fortunately I do not see any evidence of active infection locally nor systemically at this time. She has been seeing Dr. Mikey Bussing and to be honest has been doing very well with the cast. Subsequently she comes in  today with a cast on and we did reapply that today as well. She did not really want to she try to talk me out of that but I explained that if she wanted to heal this is really the right way to go. Patient voiced understanding. In regard to her right foot this is actually a lot better compared to the last time I saw her which is also great news. 10/11; patient presents for follow-up. Apligraf and the total contact cast was placed to the left leg at last clinic visit. She states that her right foot wound had burning pain to it with the placement of Apligraf to this area. She has been doing [place] over this area. She denies signs of infection including increased warmth, erythema or purulent drainage. 11/1; 3-week follow-up. The patient fortunately did not have a total contact cast or an Apligraf and on the left foot. She has been using Keystone ABD pads and kerlix and her own running shoes She arrives in clinic today with thick callus and a very poor surface on the left foot on the right nonviable skin subcutaneous tissue and a deep probing hole. 11/15; patient missed her last clinic appointment. She states she has not been dressing the wound beds for the past 2 weeks. She states that at she had a new roommate but is now going back to live with her mother. Apparently its been a distracting 2 weeks. Patient  currently denies signs of infection. 11/22; patient presents for follow-up. She states she has been using Keystone antibiotic and Dakin's wet-to-dry dressings to the wound beds. She is agreeable for cast placement today. We had ordered Apligraf however this has not been received by our facility. 11/29; Patient had a total contact cast placed at last clinic visit and she tolerated this well. We were using silver alginate under the cast. Patient's been using Keystone antibiotic with Aquacel to the right plantar foot wound. She has no issues or complaints today. Apligraf is available for placement today. Patient would like to proceed with this. 12/6; patient presents for follow-up. She had Apligraf placed in standard fashion last clinic visit under the total contact cast to the left lower extremity. She has been using Keystone antibiotic and Aquacel to the right plantar foot wound. She has no issues or complaints today. 12/13; patient presents for follow-up. She has finished 5 Apligraf placements. Was told she would not qualify for more. We have been doing a total contact cast to the left lower extremity. She has been using Keystone antibiotic and Aquacel to the right plantar foot wound. She has no issues or complaints today. 12/20; patient presents for follow-up. We have been using Hydrofera Blue with Keystone antibiotic under a total contact cast of the left lower extremity. She reports using Keystone antibiotic and silver alginate to the right heel wound. She has no issues or complaints today. 12/27; patient presents with a healthy wound on the left midfoot. We have Apligraf to apply that to that more also using a total contact cast. On the right we are using Keystone and silver alginate. She is offloading the right heel with a surgical shoealthough by her admission she is on her feet quite a bit 1/3; patient presents for follow-up. Apligraf was placed to the wound beds last clinic visit. She was  placed in a total contact cast to the left lower extremity. She declines a total contact cast today. She states that her mother is in the hospital and she cannot adequately  get around with the cast on. 1/10; patient presents for follow-up. She declined the total contact cast at last clinic visit. Both wounds have declined in appearance. She states that she has been on her feet and not offloading the wound beds. She currently denies signs of infection. 1/17; patient presents for follow-up. She had the total contact cast along with Apligraf placed last week to the left lower extremity. She tolerated this well. She has been using Aquacel Ag and Keystone antibiotic to the right heel wound. She currently denies signs of infection. She has no issues or complaints today. 1/24; patient presents for follow-up. We have been using Apligraf to the left foot wound along with a total contact cast. She has done well with this. T the right o heel wound she has been using Aquacel Ag and Keystone antibiotic ointment. She has no issues or complaints today. She denies signs of infection. 1/31; patient presents for follow-up. We have been using Apligraf to the left foot wound along with the total contact cast. She continues to do well with this. To the right heel we have been using Aquacel Ag and Keystone antibiotic ointment. She has no issues or complaints today. 12-01-2022 upon evaluation patient is seen today on my schedule due to the fact that she unfortunately was in the hospital yesterday. Her cast needed to come off the only reason she is out of the hospital is due to the fact that they would not take it off at the hospital which is somewhat bewildered reading to me to be perfectly honest. I am not certain why this was but either way she was released and then was placed on my schedule today in order to get this off and reapply the total contact casting as appropriate. I do not have an Apligraf for her today it was  applied last week and today's actually expired yesterday as there was some scheduling conflicts with her being in the hospital. Nonetheless we do not have that for reapplication today but the good news that she is not draining too much and the Apligraf can go for up to 2 weeks so I am going to go ahead and reapply the total contact casting but we are going to leave the Apligraf in place. 2/14; patient presents for follow-up. T the left leg she has had the total contact cast and Apligraf for the past week. She has had no issues with this. T the o o right heel she has been using antibiotic ointment and Aquacel Ag. 2/21; patient presents for follow-up. We have been using Apligraf and a total contact cast to the left lower extremity. She is tolerated this well. Unfortunately she is not approved for any more Apligraf per insurance. She has been using antibiotic ointment and Aquacel Ag to the right foot. She has no issues or complaints today. 2/28; patient presents for follow-up. We have been using Hydrofera Blue and antibiotic ointment under the total contact cast to the left lower extremity. He has been using Aquacel Ag with antibiotic ointment to the right plantar foot. She has no issues or complaints today. 3/6; patient presents for follow-up. She did not obtain her gentamicin ointment. She has been using Aquacel Ag to the right plantar foot wound. We have been using Hydrofera Blue with antibiotic ointment under the total contact cast to the left lower extremity. She has no issues or complaints today. 3/12; patient presents for follow-up. She has been using gentamicin ointment and Aquacel Ag to the right  plantar foot wound. We have been using Hydrofera Blue with antibiotic ointment under the total contact cast on the left lower extremity foot wound. She has no issues or complaints today. 3/20; patient presents for follow-up. She has been using gentamicin ointment and Aquacel Ag to the right plantar foot  wound. We have been using Hydrofera Blue with antibiotic ointment under the total contact cast to the left lower extremity wound. She has no issues or complaints today. 3/27; patient presents for follow-up. We have been using antibiotic ointment and Aquacel Ag to the right plantar foot wound. We have been using Hydrofera Blue with antibiotic ointment under the total contact cast to the left lower extremity. Both wounds are smaller. 01-24-2023 upon evaluation today patient actually appears to be doing better compared to last time I saw her. It has been several weeks since I last saw her but nonetheless I think the total contact casting is doing a good job. Fortunately I do not see any evidence of infection or worsening overall which is great news and in general I do believe that she is moving in the appropriate direction. It has been a very long road but nonetheless I feel like she is nearing the end at least in regard to the left foot and even the right foot looks much better to be perfectly honest. 01-31-2023 upon evaluation today patient appears to be doing well currently in regard to her wounds. She has been require some sharp debridement which I think is probably bone both sides to remove a lot of callus that has built up at this point. I discussed that with her today. Also think total contact cast is doing well for the left foot Emina continue that as well. ASHLEYMARIE, DINSMORE A ([id number]) [id number] Page 34 of 22 4/17; patient presents for follow-up. We have been doing Hydrofera Blue with antibiotic ointment under the total contact cast to the left lower extremity and Aquacel Ag with antibiotic ointment to the right foot wound. She has been using her offloading heel shoe here. 4/24; patient presents for follow-up. We have been doing Hydrofera Blue with antibiotic ointment under the total contact cast to the left lower extremity and Aquacel Ag with antibiotic ointment to the  right foot wound. She has been using her offloading heel shoe here. Wounds are smaller with slough accumulation. 5/1; patient presents for follow-up. We have been doing Hydrofera Blue with antibiotic ointment under the total contact cast to the left lower extremity and Aquacel Ag with antibiotic ointment to the right foot wound. Left foot wound is stable and right foot wound appears smaller. 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 On the left she is using Hydrofera Blue topical antibiotic under a total contact cast, on the right plantar heel gentamicin Aquacel Ag. According to our intake nurse both are making slow but steady improvements. 5/15; patient presents for follow-up. We have been doing Hydrofera Blue with antibiotic ointment under the total contact cast on the left and antibiotic ointment with Aquacel Ag on the right. Unfortunately the left foot wound has declined in size and appearance. We also do not have the total contact cast available in office today. Patient denies signs of infection. 5/22; patient presents for follow-up. We have been doing Hydrofera Blue and antibiotic ointment to the left foot and Aquacel Ag with antibiotic ointment to the right foot. We took a break from the cast since we did not have it placed at last week. Wounds look stable if not slightly  improved. 03-27-2023 patient presents today she has had this For Most 2 Weeks Because She Was in the Hospital and They Did Not T It off. Again That Is Really Not ake Something That She Could Control but Nonetheless We Are Seeing Her at This Point Today for Reevaluation. We Do Want to Go and See about Reapply the Cast. Again We Had All Set Everything up to Go Ahead and Do This When We Went to Get the Cast Only to Realize That Not Had Come in and We Were out at the Moment. With That Being [place] We Will Get Have T Get the Exam before We Can Reapply the T Contact Casting Going Forward. o otal 6/12; patient presents for follow-up.  She has been using Hydrofera Blue to the left foot wound and Aquacel Ag to the right foot wound. No cast was available last week placed on the patient so she has been offloading with her surgical shoe. She has no issues or complaints today. 6/19; patient presents for follow-up. We have been using Hydrofera Blue to the left foot wound under the total contact cast and Aquacel Ag to the right wound. She has no issues or complaints today. The wounds are smaller. 6/26; patient presents for follow-up. We have been using Hydrofera Blue to the left foot wound under the total contact cast and Aquacel Ag to the right foot wound. Wounds are smaller. Patient has no issues or complaints today. 7/3; patient presents for follow-up. We have been using Hydrofera Blue to the left foot wound under the total contact cast and Aquacel Ag to the right foot wound. Wounds Appear smaller. No signs of infection. 7/10; patient presents for follow-up. We have been using Hydrofera Blue to the left foot wound under the total contact cast and Aquacel Ag to the right foot wound. Left foot wound is smaller. Right foot wound is stable. 7/17; patient presents for follow-up. We have been using Hydrofera Blue to the left foot under the total contact cast and Aquacel Ag to the right foot wound. She has no issues or complaints today. 7/24; patient presents for follow-up. We have been using Hydrofera Blue to the left foot under the total contact cast and Aquacel Ag to the right foot wound. Unfortunately she was bit by her dog over the weekend and developed a wound to the right lower leg. She has been keeping the area covered. 7/31; patient presents for follow-up. We have been using Hydrofera Blue to the left foot under the total contact cast and Aquacel Ag to the right foot wound and mupirocin ointment to the right anterior leg wound. She started her oral antibiotics 2 days ago. 8/7; patient presents for follow-up. We have been using  Hydrofera Blue and antibiotic ointment to the left foot under the total contact cast and Aquacel Ag to the right foot wound. Antibiotic ointment to the right anterior leg wound. She completed her oral antibiotics. She has no issues or complaints today. Wounds continue to improve albeit slowly. 8/14; 8/14; patient has 3 wounds 1 on the left plantar heel 1 on the right plantar heel and a small wound on the right anterior lower leg. We have been using Hydrofera Blue in the left heel Aquacel on the right heel. The left heel has been in a total contact cast 8/21; patient presents for follow-up. We have been using Hydrofera Blue to the left foot wound under the total contact cast. T the right heel patient has been o using Aquacel Ag.  T the anterior leg wound antibiotic ointment. All wounds have healed. o 8/28; patient presents for follow-up. She has been using her surgical shoe with offloading felt pads. Her wounds have remained closed. 9/11; patient presents for final follow-up to assure that her wounds have remained closed. Unfortunately the left foot wound has opened again. But she has been wearing her Surgical shoe for offloading with felt pad. 07-14-2023 upon evaluation today patient appears to be doing well currently in regard to her wound. She has been tolerating the dressing changes without complication with total contact casting. I am going to be helping take care of her at this point as she is transferring from Dr. Mikey Bussing to me since Dr. Mikey Bussing is on maternity leave. 07-24-2023 upon evaluation today patient appears to be doing decently well currently in regard to her wound. She is actually showing signs of improvement in regard to the left foot and I feel like this is much better than last week no debridement even necessary today. We may have to perform some slight debridement come next week we will see how things go. Monitor for any signs of worsening overall and obviously if anything changes  she should be letting us know. 08-01-2023 upon evaluation today patient appears to be doing excellent in regard to her feet. The right foot/heel area is completely healed she does have some callus buildup I am going to trim this down a little bit I do not want to build up to the point of cracking she is doing well with the urea cream. With regard to the left heel she is doing well with casting I am going to perform some debridement here to clear away before reapplying the cast and I think she is really doing quite well though. 08-07-2023 upon evaluation patient's wound actually showed signs of excellent improvement and actually very pleased with where we stand I do believe that she is making good headway towards complete closure which is great news. 08-14-2023 upon evaluation today patient appears to be doing well currently in regard to her wound. She actually is going require a little bit of debridement but fortunately nothing too significant today actually feel like she is doing much better. This is mainly to remove callus on the little slough and biofilm down to good subcutaneous tissue. In general I think that she is making excellent headway here. 08-21-2023 upon evaluation today patient appears to be doing well currently in regard to her wound which is actually showing signs of significant improvement. I am extremely pleased with where things stand I do not see any signs of infection at this time which is great news and overall I do think that we are making headway towards complete closure which is also excellent news. 08-30-2023 upon evaluation today patient's wound is showing signs of continued improvement. Fortunately I do not see any evidence of worsening overall and I believe that the patient is making excellent headway towards closure. I am extremely pleased with where we stand. 09-04-2023 upon evaluation today patient appears to be doing well currently in regard to her foot ulcer. She has  been tolerating the dressing changes without SHIRRELL, DELMONICO A ([id number]) [id number] Page 20 of 22 complication. Fortunately I do not see any signs of active infection locally or systemically at this time which is great news. No fevers, chills, nausea, vomiting, or diarrhea. 09-11-2023 upon evaluation today patient appears to be doing well currently in regard to her wound on the left foot. I feel like she  is actually showing signs of improvement I feel like the collagen is doing a good job. With that being said unfortunately she has an area where there is a spot on the right foot that has not really open but at least there looks like there may have been something that popped off of this area. 09-18-2023 upon evaluation today patient appears to be doing well currently in regard to her wound although to be honest she still continues to have issues here with the wound not healing as rapidly as I would expect. She does a lot of walking on the cast from what I am hearing today and to be honest as I mention to her previously this cast is not exactly appropriate for her based on the size of her leg is technically too small and I think this is causing issues with rubbing and therefore callus buildup that would not otherwise occur with a properly fitting cast. I discussed that with her today and I think that potentially having a offloading shoe would be better specifically a front offloading boot. 09-25-2023 upon evaluation patient's wound is showing signs of improvement which is great news and in general I do feel like that she is really doing quite well. I do not see any signs of active infection at this time which is excellent and in general I think that we are moving in the right direction here. She did get the offloading boot off of [employer] that working to get a try it is a front off loader which I think should do quite well and we will get a see how that does over the next  week. 10-02-2023 upon evaluation today patient's wound actually showing signs of good improvement. I actually feel like it is overall looking better there is still quite a bit of callus but I did go ahead and perform debridement today to clear this away. She in general seems to be doing quite well. 10-09-2023 upon evaluation today patient unfortunately appears to be doing worse in regard to her wound she is showing signs of infection and this is not good. Fortunately I do not see any evidence of deeper wound but at the same time I do feel like there is some area underneath the callus where this has spread again we have previously gotten that healed. I think that she is probably going to need some oral antibiotic therapy. 10-17-2023 upon evaluation today patient unfortunately appears to be doing better with regard to the infection but has a lot more callus buildup. I really do not think that the boot is working out well for her. 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 Either way I do not believe at this point that this is going to be the appropriate way to continue currently. Objective Constitutional Well-nourished and well-hydrated in no acute distress. Vitals Time Taken: 11:33 AM, Height: 69 in, Weight: 178 lbs, BMI: 26.3, Temperature: 98.8 F, Pulse: 72 bpm, Respiratory Rate: 16 breaths/min, Blood Pressure: 93/63 mmHg. Respiratory normal breathing without difficulty. Psychiatric this patient is able to make decisions and demonstrates good insight into disease process. Alert and Oriented x 3. pleasant and cooperative. General Notes: Upon inspection patient's wound bed actually showed signs of pretty good granulation at this time once I got the callus and necrotic tissue clearway. I did not see any abscesses which was good news and in general I believe there were making pretty good  headway here towards closure which is excellent as  well. Nonetheless I think she is gena require going back to the total contact casting as I feel like that the boot is not doing any good for her. Again she had been in the cast for so long we have attempted to try something different that I was hopeful would be beneficial for her unfortunately he just does not seem to have done as well as I would have liked of seeing. I do feel like the infection however is improved. Integumentary (Hair, Skin) Wound #12R status is Open. Original cause of wound was Pressure Injury. The date acquired was: 03/16/2020. The wound has been in treatment 82 weeks. The wound is located on the Left,Medial,Plantar Foot. The wound measures 1.2cm length x 0.7cm width x 0.2cm depth; 0.66cm^2 area and 0.132cm^3 volume. There is Fat Layer (Subcutaneous Tissue) exposed. There is a medium amount of serosanguineous drainage noted. The wound margin is flat and intact. There is no granulation within the wound bed. There is no necrotic tissue within the wound bed. 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 There was a Excisional Skin/Subcutaneous Tissue Debridement with a total area of 4.71 sq cm performed by [name], PA-C. With the following instrument(s): Curette to remove Viable and Non-Viable tissue/material. Material removed includes Callus, Subcutaneous Tissue, and Slough. A time out was conducted at 12:00,  prior to the start of the procedure. A Minimum amount of bleeding was controlled with Pressure. The procedure was tolerated well. Post Debridement Measurements: 3cm length x 2cm width x 0.2cm depth; 0.942cm^3 volume. Character of Wound/Ulcer Post Debridement is stable. Severity of Tissue Post Debridement is: Fat layer exposed. Post procedure Diagnosis Wound #12R: Same as Pre-Procedure Plan Follow-up Appointments: Return Appointment in 1 week. Bathing/ Shower/ Hygiene: May shower; gently cleanse wound with antibacterial soap, rinse and pat dry prior to dressing wounds No tub bath. Off-Loading: Open toe surgical shoe - right foot Other: - front offloading boot left foot Medications-Please add to medication list.: P.O. Antibiotics - start Cipro as directed WOUND #12R: - Foot Wound Laterality: Plantar, Left, Medial Cleanser: Vashe 5.8 (oz) Every Other Day/30 Days Discharge Instructions: Use vashe 5.8 (oz) as directed Topical: Mupirocin Ointment Every Other Day/30 Days Discharge Instructions: Apply as directed by provider. Prim Dressing: Hydrofera Blue Ready Transfer Foam, 2.5x2.5 (in/in) Every Other Day/30 Days ary Discharge Instructions: Apply Hydrofera Blue Ready to wound bed as directed Secondary Dressing: Gauze Every Other Day/30 Days Discharge Instructions: As directed: dry, moistened with saline or moistened with Dakins Solution Secured With: Medipore T - 88M Medipore H Soft Cloth Surgical T ape ape, 2x2 (in/yd) Every Other Day/30 Days Secured With: [employer] Sterile or Non-Sterile 6-ply 4.5x4 (yd/yd) Every Other Day/30 Days Discharge Instructions: Apply Kerlix as directed Secured With: [employer] Dressing, Latex-free, Size 5, Small-Head / Shoulder / Thigh Every Other Day/30 Days 1. I have her continue with her medication currently she is taking this after her dialysis and will be done next week. 2. I am good recommend  as well the patient should continue with the [facility]  dressing followed by gauze and roll gauze to secure in place. 3. I am also can recommend for the time being second and not put the cast on her today due to the infection that she really needs to stay off of this is much as possible for the next week if she does not need to be doing something I would recommend that she not do it. She voiced understanding. We will see patient back for reevaluation in 1 week here in the clinic. If anything worsens or changes patient will contact our office for additional recommendations. Electronic Signature(s) Signed: 10/17/2023 12:23:10 PM By: [name] PA-C Entered By: [name] on 10/17/2023 09:23:10 -------------------------------------------------------------------------------- SuperBill Details Patient Name: Date of Service: [name] 10/17/2023 Medical Record Number: [id number] Patient Account Number: [id number] Date of Birth/Sex: Treating RN: [birth year] (47 y.o. Ginette Pitman Primary Care Provider: [name] Other Clinician: [name] Referring Provider: Treating Provider/Extender: [name] Weeks in Treatment: 82 Diagnosis Coding ICD-10 Codes Code Description DARIANA, ELLINGBOE A ([id number]) [id number] Page 22 of 22 E11.621 Type 2 diabetes mellitus with foot ulcer L97.528 Non-pressure chronic ulcer of other part of left foot with other specified severity E11.42 Type 2 diabetes mellitus with diabetic polyneuropathy Z79.4 Long term (current) use of insulin N18.6 End stage renal disease Z99.2 Dependence on renal dialysis L84 Corns and callosities Facility Procedures : CPT4 Code: [id number] Description: 11042 - DEB SUBQ TISSUE 20 SQ CM/< ICD-10 Diagnosis Description L97.528 Non-pressure chronic ulcer of other part of left foot with other specified seve Modifier: rity Quantity: 1 Physician Procedures : CPT4 Code Description Modifier 11042 11042 - WC PHYS SUBQ TISS 20 SQ CM ICD-10  Diagnosis Description L97.528 Non-pressure chronic ulcer of other part of left foot with other specified severity Quantity: 1 Electronic Signature(s) Signed: 10/17/2023 12:30:49 PM By: [name] PA-C Entered By: [name] on 10/17/2023 09:30:47

## 2023-10-23 ENCOUNTER — Ambulatory Visit: Payer: MEDICAID | Admitting: Internal Medicine

## 2023-10-24 ENCOUNTER — Encounter: Payer: MEDICAID | Admitting: Internal Medicine

## 2023-10-24 DIAGNOSIS — E11621 Type 2 diabetes mellitus with foot ulcer: Secondary | ICD-10-CM | POA: Diagnosis not present

## 2023-10-25 HISTORY — PX: AV FISTULA PLACEMENT: SHX1204

## 2023-10-25 NOTE — Progress Notes (Signed)
 Heather [name], Heather [name] ([id number]) [id number] Page 1 of 8 Visit Report for 10/24/2023 Arrival Information Details Patient Name: Date of Service: Heather [name] 10/24/2023 3:45 PM Medical Record Number: [id number] Patient Account Number: [id number] Date of Birth/Sex: Treating RN: Mar 14, 1976 (49 y.o. Heather [name] Primary Care Candela Krul: [name] Other Clinician: [name] Referring Thierno Hun: Treating Dorwin Fitzhenry/Extender: RO BSO N, MICHA EL KANDICE Rosan, [name] Weeks in Treatment: 49 Visit Information History Since Last Visit All ordered tests and consults were completed: No Patient Arrived: Wheel Chair Added or deleted any medications: No Arrival Time: 15:47 Any new allergies or adverse reactions: No Transfer Assistance: None Had [name] fall or experienced change in No Patient Identification Verified: Yes activities of daily living that may affect Secondary Verification Process Completed: Yes risk of falls: Patient Requires Transmission-Based Precautions: No Signs or symptoms of abuse/neglect since last visito No Patient Has Alerts: No Hospitalized since last visit: No Implantable device outside of the clinic excluding No cellular tissue based products placed in the center since last visit: Has Dressing in Place as Prescribed: Yes Pain Present Now: No Electronic Signature(s) Signed: 10/24/2023 4:58:33 PM By: [name] Entered By: [name] on 10/24/2023 15:47:53 -------------------------------------------------------------------------------- Clinic Level of Care Assessment Details Patient Name: Date of Service: Heather [name], Heather [name] 10/24/2023 3:45 PM Medical Record Number: [id number] Patient Account Number: [id number] Date of Birth/Sex: Treating RN: 06-26-1976 (48 y.o. Heather [name] Primary Care Trong Gosling: [name] Other Clinician: [name] Referring Yonis Carreon: Treating Jathan Balling/Extender: RO BSO N, MICHA EL KANDICE Rosan,  [name] Weeks in Treatment: 57 Clinic Level of Care Assessment Items TOOL 1 Quantity Score []  - 0 Use when EandM and Procedure is performed on INITIAL visit ASSESSMENTS - Nursing Assessment / Reassessment []  - 0 General Physical Exam (combine w/ comprehensive assessment (listed just below) when performed on new pt. evals) []  - 0 Comprehensive Assessment (HX, ROS, Risk Assessments, Wounds Hx, etc.) [place], [place] [name] ([id number]) [phone].pdf Page 2 of 8 ASSESSMENTS - Wound and Skin Assessment / Reassessment []  - 0 Dermatologic / Skin Assessment (not related to wound area) ASSESSMENTS - Ostomy and/or Continence Assessment and Care []  - 0 Incontinence Assessment and Management []  - 0 Ostomy Care Assessment and Management (repouching, etc.) PROCESS - Coordination of Care []  - 0 Simple Patient / Family Education for ongoing care []  - 0 Complex (extensive) Patient / Family Education for ongoing care []  - 0 Staff obtains Chiropractor, Records, T Results / Process Orders est []  - 0 Staff telephones HHA, Nursing Homes / Clarify orders / etc []  - 0 Routine Transfer to another Facility (non-emergent condition) []  - 0 Routine Hospital Admission (non-emergent condition) []  - 0 New Admissions / Manufacturing Engineer / Ordering NPWT Apligraf, etc. , []  - 0 Emergency Hospital Admission (emergent condition) PROCESS - Special Needs []  - 0 Pediatric / Minor Patient Management []  - 0 Isolation Patient Management []  - 0 Hearing / Language / Visual special needs []  - 0 Assessment of Community assistance (transportation, D/C planning, etc.) []  - 0 Additional assistance / Altered mentation []  - 0 Support Surface(s) Assessment (bed, cushion, seat, etc.) INTERVENTIONS - Miscellaneous []  - 0 External ear exam []  - 0 Patient Transfer (multiple staff / Nurse, Adult / Similar devices) []  - 0 Simple Staple / Suture removal (25 or less) []  - 0 Complex Staple / Suture  removal (26 or more) []  - 0 Hypo/Hyperglycemic Management (do not check if billed separately) []  - 0 Ankle / Brachial Index (ABI) - do  not check if billed separately Has the patient been seen at the hospital within the last three years: Yes Total Score: 0 Level Of Care: ____ Electronic Signature(s) Signed: 10/24/2023 4:58:33 PM By: [name] Entered By: [name] on 10/24/2023 16:30:46 -------------------------------------------------------------------------------- Encounter Discharge Information Details Patient Name: Date of Service: Heather [name] QUAKER [name] 10/24/2023 3:45 PM Medical Record Number: [id number] Patient Account Number: [id number] Date of Birth/Sex: Treating RN: Jul 08, 1976 (47 y.o. Heather [name] Primary Care Adamari Frede: [name] Other Clinician: [name] Referring Noris Kulinski: Treating Crue Otero/Extender: [address], MICHA EL KANDICE Rosan [place], [place] [name] ([id number]) [id number] Page 3 of 8 Weeks in Treatment: 86 Encounter Discharge Information Items Post Procedure Vitals Discharge Condition: Stable Temperature (F): 98.8 Ambulatory Status: Wheelchair Pulse (bpm): 74 Discharge Destination: Home Respiratory Rate (breaths/min): 18 Transportation: Private Auto Blood Pressure (mmHg): 84/62 Accompanied By: mom Schedule Follow-up Appointment: Yes Clinical Summary of Care: Electronic Signature(s) Signed: 10/24/2023 4:58:33 PM By: [name] Entered By: [name] on 10/24/2023 16:47:58 -------------------------------------------------------------------------------- Lower Extremity Assessment Details Patient Name: Date of Service: Heather [name], Heather [name] 10/24/2023 3:45 PM Medical Record Number: [id number] Patient Account Number: [id number] Date of Birth/Sex: Treating RN: 1976/01/15 (48 y.o. Heather [name] Primary Care Ismael Treptow: [name] Other Clinician: [name] Referring Naturi Alarid: Treating Izell Labat/Extender:  RO BSO LOISE, MICHA EL KANDICE Rosan, [name] Weeks in Treatment: 47 Electronic Signature(s) Signed: 10/24/2023 4:58:33 PM By: [name] Signed: 10/24/2023 5:14:10 PM By: Claudene Blossom MSN RN CNS WTA Entered By: [name] on 10/24/2023 15:57:45 -------------------------------------------------------------------------------- Multi Wound Chart Details Patient Name: Date of Service: Heather [name] QUAKER [name] 10/24/2023 3:45 PM Medical Record Number: [id number] Patient Account Number: [id number] Date of Birth/Sex: Treating RN: 1976/10/01 (47 y.o. Heather [name] Primary Care Erla Bacchi: [name] Other Clinician: [name] Referring Maygan Koeller: Treating Zairah Arista/Extender: RO BSO N, MICHA EL KANDICE Rosan, [name] Weeks in Treatment: 5 Vital Signs Height(in): 69 Pulse(bpm): 74 Weight(lbs): 178 Blood Pressure(mmHg): 84/62 Body Mass Index(BMI): 26.3 Temperature(F): 98.8 Respiratory Rate(breaths/min): 16 [Heather [name], Heather [name] ([id number]):Photos:] [phone].pdf Page 4 of 8:12R N/[name] N/[name] N/[name] N/[name]] Left, Medial, Plantar Foot N/[name] N/[name] Wound Location: Pressure Injury N/[name] N/[name] Wounding Event: Diabetic Wound/Ulcer of the Lower N/[name] N/[name] Primary Etiology: Extremity Chronic sinus problems/congestion, N/[name] N/[name] Comorbid History: Middle ear problems, Anemia, Chronic Obstructive Pulmonary Disease (COPD), Congestive Heart Failure, Type II Diabetes, End Stage Renal Disease, History of pressure wounds, Neuropathy 03/16/2020 N/[name] N/[name] Date Acquired: 32 N/[name] N/[name] Weeks of Treatment: Open N/[name] N/[name] Wound Status: Yes N/[name] N/[name] Wound Recurrence: 1.4x0.9x0.3 N/[name] N/[name] Measurements L x W x D (cm) 0.99 N/[name] N/[name] [name] (cm) : rea 0.297 N/[name] N/[name] Volume (cm) : 79.00% N/[name] N/[name] % Reduction in [name] rea: 68.50% N/[name] N/[name] % Reduction in Volume: Grade 3 N/[name] N/[name] Classification: Medium N/[name] N/[name] Exudate [name] mount: Serosanguineous N/[name] N/[name] Exudate Type: red, brown N/[name] N/[name] Exudate Color: Flat and Intact N/[name]  N/[name] Wound Margin: None Present (0%) N/[name] N/[name] Granulation [name] mount: None Present (0%) N/[name] N/[name] Necrotic [name] mount: Fat Layer (Subcutaneous Tissue): Yes N/[name] N/[name] Exposed Structures: Fascia: No Tendon: No Muscle: No Joint: No Bone: No Medium (34-66%) N/[name] N/[name] Epithelialization: Treatment Notes Electronic Signature(s) Signed: 10/24/2023 4:58:33 PM By: [name] Entered By: [name] on 10/24/2023 15:58:03 -------------------------------------------------------------------------------- Multi-Disciplinary Care Plan Details Patient Name: Date of Service: Heather [name] QUAKER RONAL 10/24/2023 3:45 PM Medical Record Number: [id number] Patient Account Number: [id number] Date of Birth/Sex: Treating RN: 1975-12-24 (47 y.o. Heather [name] Primary Care Faryal Marxen: [name] Other Clinician:  [name] Referring Tiffny Gemmer: Treating Niesha Bame/Extender: RO BSO LOISE, MICHA EL KANDICE [place], [name] Weeks in Treatment: [address] Heather [name], Heather [name] ([id number]) [id number] Page 5 of 8 Abuse / Safety / Falls / Self Care Management Nursing Diagnoses: History of Falls Potential for falls Potential for injury related to falls Goals: Patient will not develop complications from immobility Date Initiated: 03/17/2022 Date Inactivated: 04/20/2022 Target Resolution Date: 03/16/2022 Goal Status: Met Patient/caregiver will verbalize understanding of skin care regimen Date Initiated: 03/17/2022 Date Inactivated: 09/28/2022 Target Resolution Date: 03/16/2022 Goal Status: Met Patient/caregiver will verbalize/demonstrate measure taken to improve self care Date Initiated: 03/17/2022 Target Resolution Date: 06/21/2023 Goal Status: Active Interventions: Assess fall risk on admission and as needed Provide education on basic hygiene Provide education on personal and home safety Notes: Electronic Signature(s) Signed: 10/24/2023 4:58:33 PM By: [name] Signed: 10/24/2023 5:14:10  PM By: Claudene Blossom MSN RN CNS WTA Entered By: [name] on 10/24/2023 16:46:09 -------------------------------------------------------------------------------- Pain Assessment Details Patient Name: Date of Service: Heather [name] QUAKER RONAL 10/24/2023 3:45 PM Medical Record Number: [id number] Patient Account Number: [id number] Date of Birth/Sex: Treating RN: 06-30-1976 (47 y.o. Heather [name] Primary Care Auna Mikkelsen: Eastern Mix Other Clinician: [name] Referring Wen Merced: Treating Erdine Hulen/Extender: RO BSO N, MICHA EL KANDICE [place], [name] Weeks in Treatment: 40 Active Problems Location of Pain Severity and Description of Pain Patient Has Paino No Site Locations [place], Heather [name] ([id number]) [id number] Page 6 of 8 Pain Management and Medication Current Pain Management: Electronic Signature(s) Signed: 10/24/2023 4:58:33 PM By: [name] Signed: 10/24/2023 5:14:10 PM By: Claudene Blossom MSN RN CNS WTA Entered By: [name] on 10/24/2023 15:50:03 -------------------------------------------------------------------------------- Patient/Caregiver Education Details Patient Name: Date of Service: Heather [name] QUAKER RONAL 12/31/2024andnbsp3:45 PM Medical Record Number: [id number] Patient Account Number: [id number] Date of Birth/Gender: Treating RN: [birth year] (48 y.o. Heather [name] Primary Care Physician: [place] Mix Other Clinician: [name] Referring Physician: Treating Physician/Extender: RO BSO N, MICHA EL KANDICE [place], Mix Duos in Treatment: 29 Education Assessment Education Provided To: Patient Education Topics Provided Wound/Skin Impairment: Handouts: Other: continue wound care as directed Methods: Explain/Verbal Responses: State content correctly Electronic Signature(s) Signed: 10/24/2023 4:58:33 PM By: [name] Entered By: [name] on 10/24/2023  16:46:45 -------------------------------------------------------------------------------- Wound Assessment Details Patient Name: Date of Service: Heather [name], Heather [name] 10/24/2023 3:45 PM Medical Record Number: [id number] Patient Account Number: [id number] Date of Birth/Sex: Treating RN: 1976-06-24 (47 y.o. Heather [name] Primary Care Sahian Kerney: [place] Mix Other Clinician: [name] Referring Shemuel Harkleroad: Treating Dekota Kirlin/Extender: RO BSO N, MICHA EL KANDICE [place], [name] Weeks in Treatment: [address] Wound Status Heather [name], Heather [name] ([id number]) [id number] Page 7 of 8 Wound Number: 12R Primary Diabetic Wound/Ulcer of the Lower Extremity Etiology: Wound Location: Left, Medial, Plantar Foot Wound Open Wounding Event: Pressure Injury Status: Date Acquired: 03/16/2020 Comorbid Chronic sinus problems/congestion, Middle ear problems, Anemia, Weeks Of Treatment: 83 History: Chronic Obstructive Pulmonary Disease (COPD), Congestive Heart Clustered Wound: No Failure, Type II Diabetes, End Stage Renal Disease, History of pressure wounds, Neuropathy Photos Wound Measurements Length: (cm) 1.4 Width: (cm) 0.9 Depth: (cm) 0.3 Area: (cm) 0.99 Volume: (cm) 0.297 % Reduction in Area: 79% % Reduction in Volume: 68.5% Epithelialization: Medium (34-66%) Wound Description Classification: Grade 3 Wound Margin: Flat and Intact Exudate Amount: Medium Exudate Type: Serosanguineous Exudate Color: red, brown Foul Odor After Cleansing: No Slough/Fibrino Yes Wound Bed Granulation Amount: None Present (0%) Exposed Structure Necrotic Amount: None Present (0%) Fascia Exposed: No Fat Layer (Subcutaneous Tissue) Exposed: Yes Tendon Exposed: No  Muscle Exposed: No Joint Exposed: No Bone Exposed: No Treatment Notes Wound #12R (Foot) Wound Laterality: Plantar, Left, Medial Cleanser Vashe 5.8 (oz) Discharge Instruction: Use vashe 5.8 (oz) as directed Peri-Wound  Care Topical Mupirocin  Ointment Discharge Instruction: Apply as directed by Bryonna Sundby. Primary Dressing Hydrofera Blue Ready Transfer Foam, 2.5x2.5 (in/in) Discharge Instruction: Apply Hydrofera Blue Ready to wound bed as directed Secondary Dressing Gauze Discharge Instruction: As directed: dry, moistened with saline or moistened with Dakins Solution Secured With Medipore T - 72M Medipore H Soft Cloth Surgical T ape ape, 2x2 (in/yd) Kerlix Roll Sterile or Non-Sterile 6-ply 4.5x4 (yd/yd) Discharge Instruction: Apply Kerlix as directed Stretch Net Dressing, Latex-free, Size 5, Small-Head / Shoulder / Thigh Compression Wrap Heather [name], Heather [name] ([id number]) [phone].pdf Page 8 of 8 Compression Stockings Add-Ons Electronic Signature(s) Signed: 10/24/2023 4:58:33 PM By: [name] Signed: 10/24/2023 5:14:10 PM By: Claudene Blossom MSN RN CNS WTA Entered By: [name] on 10/24/2023 15:57:18 -------------------------------------------------------------------------------- Vitals Details Patient Name: Date of Service: Heather [name] QUAKER [name] 10/24/2023 3:45 PM Medical Record Number: [id number] Patient Account Number: [id number] Date of Birth/Sex: Treating RN: 04-Jan-1976 (47 y.o. Heather [name] Primary Care Marlise Fahr: [name] Other Clinician: [name] Referring Zacchary Pompei: Treating Kashayla Ungerer/Extender: RO BSO N, MICHA EL KANDICE Rosan, [name] Weeks in Treatment: 58 Vital Signs Time Taken: 15:48 Temperature (F): 98.8 Height (in): 69 Pulse (bpm): 74 Weight (lbs): 178 Respiratory Rate (breaths/min): 16 Body Mass Index (BMI): 26.3 Blood Pressure (mmHg): 84/62 Reference Range: 80 - 120 mg / dl Electronic Signature(s) Signed: 10/24/2023 4:58:33 PM By: [name] Entered By: [name] on 10/24/2023 15:49:56

## 2023-10-25 NOTE — Progress Notes (Signed)
 JAKERA, BEAUPRE A ([id number]) [id number] Page 1 of 21 Visit Report for 10/24/2023 Debridement Details Patient Name: Date of Service: [name], LUPI 10/24/2023 3:45 PM Medical Record Number: [id number] Patient Account Number: [id number] Date of Birth/Sex: Treating RN: [birth year] (48 y.o. [name] Claudene Blossom Primary Care Provider: [name] Other Clinician: [name] Referring Provider: Treating Provider/Extender: RO BSO LOISE, MICHA EL KANDICE Rosan, [name] Weeks in Treatment: 83 Debridement Performed for Assessment: Wound #12R Left,Medial,Plantar Foot Performed By: Physician RUFUS OZELL KANDICE, MD The following information was scribed by: [name] The information was scribed for: Shian Goodnow G Debridement Type: Debridement Severity of Tissue Pre Debridement: Fat layer exposed Level of Consciousness (Pre-procedure): Awake and Alert Pre-procedure Verification/Time Out Yes - 16:24 Taken: Start Time: 16:24 Percent of Wound Bed Debrided: 100% T Area Debrided (cm): otal 1.13 Tissue and other material debrided: Viable, Non-Viable, Callus, Skin: Dermis , Skin: Epidermis Level: Skin/Epidermis Debridement Description: Selective/Open Wound Instrument: Curette Bleeding: Moderate Hemostasis Achieved: Pressure Response to Treatment: Procedure was tolerated well Level of Consciousness (Post- Awake and Alert procedure): Post Debridement Measurements of Total Wound Length: (cm) 1.6 Width: (cm) 0.9 Depth: (cm) 0.3 Volume: (cm) 0.339 Character of Wound/Ulcer Post Debridement: Stable Severity of Tissue Post Debridement: Fat layer exposed Post Procedure Diagnosis Same as Pre-procedure Electronic Signature(s) Signed: 10/24/2023 4:49:51 PM By: Rufus Ozell MD Signed: 10/24/2023 4:58:33 PM By: [name] Signed: 10/24/2023 5:14:10 PM By: Claudene Blossom MSN RN CNS WTA Entered By: [name] on 10/24/2023 16:28:39 CECILIE QUAKER A ([id number])  [id number] Page 2 of 21 -------------------------------------------------------------------------------- HPI Details Patient Name: Date of Service: [name], [name] 10/24/2023 3:45 PM Medical Record Number: [id number] Patient Account Number: [id number] Date of Birth/Sex: Treating RN: [birth year] (48 y.o. [name] Claudene Blossom Primary Care Provider: [name] Other Clinician: [name] Referring Provider: Treating Provider/Extender: RO BSO N, MICHA EL KANDICE Rosan, [name] Weeks in Treatment: 41 History of Present Illness HPI Description: 01/18/18-She is here for initial evaluation of the left great toe ulcer. She is a poor historian in regards to timeframe in detail. She states approximately 4 weeks ago she lacerated her toe on something in the house. She followed up with her primary care who placed her on Bactrim and ultimately a second dose of Bactrim prior to coming to wound clinic. She states she has been treating the toe with peroxide, Betadine  and a Band-Aid. She did not check her blood sugar this morning but checked it yesterday morning it was 327; she is unaware of a recent A1c and there are no current records. She saw Dr. she would've orthopedics last week for an old injury to the left ankle, she states he did not see her toe, nor did she bring it to his attention. She smokes approximately 1 pack cigarettes a day. Her social situation is concerning, she arrives this morning with her mother who appears extremely intoxicated/under the influence; her mother was asked to leave the room and be monitored by the patient's grandmother. The patient's aunt then accompanied the patient and the room throughout the rest of the appointment. We had a lengthy discussion regarding the deleterious effects of uncontrolled hyperglycemia and smoking as it relates to wound healing and overall health. She was strongly encouraged to decrease her smoking and get her diabetes under  better control. She states she is currently on a diet and has cut down her [facility] consumption. The left toe is erythematous, macerated and slightly edematous with malodor present. The edema in her left foot  is below her baseline, there is no erythema streaking. We will treat her with Santyl, doxycycline ; we have ordered and xray, culture and provided a Peg assist surgical shoe and cultured the wound. 01/25/18-She is here in follow-up evaluation for a left great toe ulcer and presents with an abscess to her suprapubic area. She states her blood sugars remain elevated, feeling sick and if levels are below 250, but she is trying. She has made no attempt to decrease her smoking stating that we can't take away her food in her cigarettes. She has been compliant with offloading using the PEG assist you. She is using Santyl daily. the culture obtained last week grew staph aureus and Enterococcus faecalis; continues on the doxycycline  and Augmentin  was added on Monday. The suprapubic area has erythema, no femoral variation, purple discoloration, minimal induration, was accessed with a cotton tip applicator with sanguinopurulent drainage, this was cultured, I suspect the current antibiotic treatment will cover and we will not add anything to her current treatment plan. She was advised to go to urgent care or ER with any change in redness, induration or fever. 02/01/18-She is here in follow-up evaluation for left great toe ulcers and a new abdominal abscess from last week. She was able to use packing until earlier this week, where she forgot it was there. She states she was feeling ill with GI symptoms last week and was not taking her antibiotic. She states her glucose levels have been predominantly less than 200, with occasional levels between 200-250. She thinks this was contributing to her GI symptoms as they have resolved without intervention. There continues to be significant laceration to left toe,  otherwise it clinically looks stable/improved. There is now less superficial opening to the lateral aspect of the great toe that was residual blister. We will transition to Hydrofera Blue to all wounds, she will continue her Augmentin . If there is no change or deterioration next week for reculture. 02/08/18-She is here in follow-up evaluation for left great toe ulcer and abdominal ulcer. There is an improvement in both wounds. She has been wrapping her left toe with coban, not by our direction, which has created an area of discoloration to the medial aspect; she has been advised to NOT use coban secondary to her neuropathy. She states her glucose levels have been high over this last week ranging from 200-350, she continues to smoke. She admits to being less compliant with her offloading shoe. We will continue with same treatment plan and she will follow-up next week. 02/15/18-She is here in follow-up evaluation for left great toe ulcer and abdominal ulcer. The abdominal ulcer is epithelialized. The left great toe ulcer is improved and all injury from last week using the Coban wrap is resolved, the lateral ulcer is healed. She admits to noncompliance with wearing offloading shoe and admits to glucose levels being greater than 300 most of the week. She continues to smoke and expresses no desire to quit. There is one area medially that probes deeper than it has historically, erythema to the toe and dorsal foot has consistently waxed and waned. There is no overt signs of cellulitis or infection but we will culture the wound for any occult infection given the new area of depth and erythema. We will hold off on sensitivities for initiation of antibiotic therapy. 02/22/18-She is here in follow up evaluation for left great toe ulcer. There is overall significant improvement in both wound appearance, erythema and edema with changes made last week. She was not  initiated on antibiotic therapy. Culture obtained last  week showed oxacillin sensitive staph aureus, sensitive to clindamycin . Clindamycin  has been called into the pharmacy but she has been instructed to hold off on initiation secondary to overall clinical improvement and her history of antibiotic intolerance. She has been instructed to contact the clinic with any noted changes/deterioration and the wound, erythema, edema and/or pain. She will follow-up next week. She continues to smoke and her glucose levels remain elevated >250; she admits to compliance with offloading shoe 03/01/18 on evaluation today patient appears to be doing fairly well in regard to her left first toe ulcer. She has been tolerating the dressing changes with the Hydrofera Blue Dressing without complication and overall this has definitely showed signs of improvement according to records as well is what the patient tells me today. I'm very pleased in that regard. She is having no pain today 03/08/18 She is here for follow up evaluation of a left great toe ulcer. She remains non-compliant with glucose control and smoking cessation; glucose levels consistently >200. She states that she got new shoe inserts/peg assist. She admits to compliance with offloading. Since my last evaluation there is significant improvement. We will switch to prisma at this time and she will follow up next week. She is noted to be tachycardic at this appointment, heart rate 120s; she has a history of heart rate 70-130 according to our records. She admits to extreme agitation r/t personal issues; she was advised to monitor her heartrate and contact her physician if it does not return to a more normal range (<100). She takes cardizem twice daily. 03/15/18-She is here in follow-up evaluation for left great toe ulcer. She remains noncompliant with glucose control and smoking cessation. She admits to compliance with wearing offloading shoe. The ulcer is improved/stable and we will continue with the same treatment plan and  she will follow-up next week 03/22/18-She is here for evaluation for left great toe ulcer. There continues to be significant improvement despite recurrent hyperglycemia (over 500 yesterday) and she continues to smoke. She has been compliant with offloading and we will continue with same treatment plan and she will follow-up next week. 03/29/18-She is here for evaluation for left great toe ulcer. Despite continuing to smoke and uncontrolled diabetes she continues to improve. She is compliant with offloading shoe. We will continue with the same treatment plan and she will follow-up next week 04/05/18- She is here in follow up evaluation for a left great toe ulcer; she presents with small pustule to left fifth toe (resembles ant bite). She admits to compliance with wearing offloading shoe; continues to smoke or have uncontrolled blood glucose control. There is more callus than usual with evidence of bleeding; she denies known trauma. 04/12/18-She is here for evaluation of left great toe ulcer. Despite noncompliance with glycemic control and smoking she continues to make improvement. She continues to wear offloading shoe. The pustule, that was identified last week, to the left fifth toe is resolved. She will follow-up in 2 weeks 05/03/18-she is seen in follow-up evaluation for a left great toe ulcer. She is compliant with offloading, otherwise noncompliant with glycemic control and smoking. She has plateaued and there is minimal improvement noted. We will transition to Hydrofera Blue, replaced the insert to her surgical shoe and she will follow-up in one week Mitzel, Cyana A ([id number]) [phone].pdf Page 3 of 21 05/10/18- She is here in follow up evaluation for a left great toe ulcer. It appears stable despite measurement change.  We will continue with same treatment plan and follow up next week. 05/24/18-She is seen in follow-up evaluation for a left great toe ulcer. She remains  compliant with offloading, has made significant improvement in her diet, decreasing the amount of sugar/soda. She said her recent A1c was 10.9 which is lower than. She did see a diabetic nutritionist/educator yesterday. She continues to smoke. We will continue with the same treatment plan and she'll follow-up next week. 05/31/18- She is seen in follow-up evaluation for left great toe ulcer. She continues to remain compliant with offloading, continues to make improvement in her diet, increasing her water and decreasing the amount of sugar/soda. She does continue to smoke with no desire to quit. We will apply Prisma to the depth and Hydrofera Blue over. We have not received insurance authorization for oasis. She will follow up next week. 06/07/18-She is seen in follow-up evaluation for left great toe ulcer. It has stalled according to today's measurements although base appears stable. She says she saw a diabetic educator yesterday; her average blood sugars are less than 300 which is an improvement for her. She continues to smoke and states that's my next step She continues with water over soda. We will order for xray, culture and reinstate ace wrap compression prior to placing apligraf for next week. She is voicing no complaints or concerns. Her dressing will change to iodoflex over the next week in preparation for apligraf. 06/14/18-She is seen in follow-up evaluation for left great toe ulcer. Plain film x-ray performed last week was negative for osteomyelitis. Wound culture obtained last week grew strep B and OSSA; she is initiated on keflex  and cefdinir  today; there is erythema to the toe which could be from ace wrap compression, she has a history of wrapping too tight and has has been encouraged to maintain ace wraps that we place today. We will hold off on application of apligraf today, will apply next week after antibiotic therapy has been initiated. She admits today that she has resumed taking a  shower with her foot/toe submerged in water, she has been reminded to keep foot/toe out of the bath water. She will be seen in follow up next week 06/21/18-she is seen in follow-up evaluation for left great toe ulcer. She is tolerating antibiotic therapy with no GI disturbance. The wound is stable. Apligraf was applied today. She has been decreasing her smoking, only had 4 cigarettes yesterday and 1 today. She continues being more compliant in diabetic diet. She will follow-up next week for evaluation of site, if stable will remove at 2 weeks. 06/28/18- She is here in follow up evalution. Apligraf was placed last week, she states the dressing fell off on Tuesday and she was dressing with hydrofera blue. She is healed and will be discharged from the clinic today. She has been instructed to continue with smoking cessation, continue monitoring glucose levels, offloading for an additional 4 weeks and continue with hydrofera blue for additional two weeks for any possible microscopic opening. Readmission: 08/07/18 on evaluation today patient presents for reevaluation concerning the ulcer of her right great toe. She was previously discharged on 06/28/18 healed. Nonetheless she states that this began to show signs of drainage she subsequently went to her primary care provider. Subsequently an x-ray was performed on 08/01/18 which was negative. The patient was also placed on antibiotics at that time. Fortunately they should have been effective for the infection. Nonetheless she's been experiencing some improvement but still has a lot of drainage coming  from the wound itself. 08/14/18 on evaluation today patient's wound actually does show signs of improvement in regard to the erythema at this point. She has completed the antibiotics. With that being said we did discuss the possibility of placing her in a total contact cast as of today although I think that I may want to give this just a little bit more time to ensure  nothing recurrence as far as her infection is concerned. I do not want to put in the cast and risk infection at that time if things are not completely resolved. With that being said she is gonna require some debridement today. 08/21/18 on evaluation today patient actually appears to be doing okay in regard to her toe ulcer. She's been tolerating the dressing changes without complication. With that being said it does appear that she is ready and in fact I think it's appropriate for us  to go ahead and initiate the total contact cast today. Nonetheless she will require some sharp debridement to prepare the wound for application. Overall I feel like things have been progressing well but we do need to do something to get this to close more readily. 08/24/18 patient seen today for reevaluation after having had the total contact cast applied on Tuesday. She seems to have done very well the wound appears to be doing great and overall I'm pleased with the progress that she's made. There were no abnormal areas of rubbing from the cast on her lower extremity. 08/30/18 on evaluation today patient actually appears to be completely healed in regard to her plantar toe ulcer. She tells me at this point she's been having a lot of issues with the cast. She almost fell a couple of times the state shall the step of her dog a couple times as well. This is been a very frustrating process for her other nonetheless she has completely healed the wound which is excellent news. Overall there does not appear to be the evidence of infection at this time which is great news. 09/11/18 evaluation today patient presents for follow-up concerning her great toe ulcer on the left which has unfortunately reopened since I last saw her which was only a couple of weeks ago. Unfortunately she was not able to get in to get the shoe and potentially the AFO that's gonna be necessary due to her left foot drop. She continues with offloading shoe but  this is not enough to prevent her from reopening it appears. When we last had her in the total contact cast she did well from a healing standpoint but unfortunately the wound reopened as soon as she came out of the cast within just a couple of weeks. 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 This is also part of the reason why she falls on a regular basis. I just do not believe that is gonna be safe for us  to reinitiate the total contact cast as last time we had this on she fell 3 times one week which is definitely not normal for her. 09/18/18 upon evaluation today the patient actually appears to be doing about the same in regard to her toe ulcer. She did not contact [employer] as I asked her to even though I had given her the prescription. In fact she actually  states that she has no idea where the prescription is. She did apparently call [facility] and they told her that all she needed to do was bring the prescription in order to be able to be seen and work on getting the AFO for her left foot. With all that being said she still does not have an appointment and I'm not sure were things stand that regard. I will give her a new prescription today in order to contact them to get this set up. 09/25/18 on evaluation today patient actually appears to be doing about the same in regard to her toes ulcer. She does have a small areas which seems to have a lot of callous buildup around the edge of the wound which is going to need sharp debridement today. She still is waiting to be scheduled for evaluation with [facility] for possibility of an AFO. She states there supposed to call her tomorrow to get this set up. Unfortunately it does appear that her foot specifically the toe area is showing signs of erythema. There does not  appear to be any systemic infection which is in these good news. 10/02/18 on evaluation today patient actually appears to be doing about the same in regard to her toe ulcer. This really has not done too well although it's not significantly larger it's also not significantly smaller. She has been tolerating the dressing changes without complication. She actually has her appointment with [facility] and [place] tomorrow to hopefully be measured for obtaining and AFO splint. I think this would be helpful preventing this from reoccurring. We had contemplated starting the cast this week although to be honest I am reluctant to do that as she's been having nausea, vomiting, and seizure activity over the past three days. She has a history of seizures and have been told is nothing that can be done for these. With that being said I do believe that along with the seizures have the nausea vomiting which upon further questioning doesn't seem to be the normal for her and makes me concerned for the possibility of infection or something else going on. I discussed this with the patient and her mother during the office visit today. I do not feel the wound is effective but maybe something else. The responses this was this just happens to her at times and we don't know why. They did not seem to be interested in going to the hospital to have this checked out further. 10/09/18 on evaluation today patient presents for follow-up concerning her ongoing toe ulcer. She has been tolerating the dressing changes without complication. Fortunately there does not appear to be any evidence of infection which is great news however I do think that the patient would benefit from going ahead for with the total contact cast. She's actually in a wheelchair today she tells me that she will use her walker if we initiate the cast. I was very specific about the fact that if we were gonna do the cast I wanted to make sure that she was using the  walker in order to prevent any falls. She tells me she does not have stairs that she has to traverse on a regular basis at her home. She has not had any seizures since last week again that something that happens to her often she tells me she did talk to [employer] and they said that it may take up to three weeks to get the brace approved for her. Hopefully that will not take that long but  nonetheless in the meantime I do think the cast could be of benefit. 10/12/18 on evaluation today patient appears to be doing rather well in regard to her toe ulcer. It's just been a few days and already this is significantly improved both as far as overall appearance and size. Fortunately there's no sign of infection. She is here for her first obligatory cast change. 10/19/18 Seen today for follow up and management of left great toe ulcer. Wound continues to show improvement. Noted small open area with seroussang drainage with palpation. Denies any increased pain or recent fevers during visit. She will continue calcium  alginate with offloading shoe. Denies any questions KENZLIE, DISCH A ([id number]) [id number] Page 4 of 21 or concerns during visit. 10/26/18 on evaluation today patient appears to be doing about the same as when I last saw her in regard to her wound bed. Fortunately there does not appear to be any signs of infection. Unfortunately she continues to have a breakdown in regard to the toe region any time that she is not in the cast. It takes almost no time at all for this to happen. Nonetheless she still has not heard anything from the brace being made by [employer] as to when exactly this will be available to her. Fortunately there is no signs of infection at this time. 10/30/18 on evaluation today patient presents for application of the total contact cast as we just received him this morning. Fortunately we are gonna be able to apply this to her today which is great news. She continues  to have no significant pain which is good news. Overall I do feel like things have been improving while she was the cast is when she doesn't have a cast that things get worse. She still has not really heard anything from [employer] regarding her brace. 11/02/18 upon evaluation today patient's wound already appears to be doing significantly better which is good news. Fortunately there does not appear to be any signs of infection also good news. Overall I do think the total contact cast as before is helping to heal this area unfortunately it's just not gonna likely keep the area closed and healed without her getting her brace at least. Again the foot drop is a significant issue for her. 11/09/18 on evaluation today patient appears to be doing excellent in regard to her toe ulcer which in fact is completely healed. Fortunately we finally got the situation squared away with the paperwork which was needed to proceed with getting her brace approved by Medicaid. I have filled that out unfortunately that information has been sent to the orthopedic office that I worked at 2 1/2 years ago and not tired Current wound care measures. Fortunately she seems to be doing very well at this time. 11/23/18 on evaluation today patient appears to be doing More Poorly Compared to Last Time I Saw Her. At [facility] She Had Completely Healed. Currently she is continuing to have issues with reopening. She states that she just found out that the brace was approved through Medicaid now she just has to go get measured in order to have this fitted for her and then made. Subsequently she does not have an appointment for this yet that is going to complicate things we obviously cannot put her back in the cast if we do not have everything measured because they're not gonna be able to measure her foot while she is in the cast. Unfortunately the other thing that I found out today as well  is that she was in the hospital over the weekend due to  having a heroin overdose. Obviously this is unfortunate and does have me somewhat worried as well. 11/30/18 on evaluation today patient's toe ulcer actually appears to be doing fairly well. The good news is she will be getting her brace in the shoes next week on Wednesday. Hopefully we will be able to get this to heal without having to go back in the cast however she may need the cast in order to get the wound completely heal and then go from there. Fortunately there's no signs of infection at this time. 12/07/18 on evaluation today patient fortunately did receive her brace and she states she could tell this definitely makes her walk better. With that being said she's been having issues with her toe where she noticed yesterday there was a lot of tissue that was loosing off this appears to be much larger than what it was previous. She also states that her leg has been read putting much across the top of her foot just about the ankle although this seems to be receiving somewhat. The total area is still red and appears to be someone infected as best I can tell. She is previously taken Bactrim and that may be a good option for her today as well. We are gonna see what I wound culture shows as well and I think that this is definitely appropriate. With that being said outside of the culture I still need to initiate something in the interim and that's what I'm gonna go ahead and select Bactrim is a good option for her. 12/14/18 on evaluation today patient appears to be doing better in regard to her left great toe ulcer as compared to last week's evaluation. There's still some erythema although this is significantly improved which is excellent news. Overall I do believe that she is making good progress is still gonna take some time before she is where I would like her to be from the standpoint of being able to place her back into the total contact cast. Hopefully we will be where we need to be by next  week. 12/21/18 on evaluation today patient actually appears to be doing poorly in regard to her toe ulcer. She's been tolerating the dressing changes without complication. Fortunately there's no signs of systemic infection although she does have a lot of drainage from the toe ulcer and this does seem to be causing some issues at this point. She does have erythema on the distal portion of her toe that appears to be likely cellulitis. 12/28/18 on evaluation today patient actually appears to be doing a little better in my pinion in regard to her toe ulcer. With that being said she still does have some evidence of infection at this time and for her culture she had both E. coli as well as enterococcus as organisms noted on evaluation. For that reason I think that though the Keflex  likely has treated the E. coli rather well this has really done nothing for the enterococcus. We are going to have to initiate treatment for this specifically. 01/04/19 on evaluation today patient's toe actually appears to be doing better from the standpoint of infection. She currently would like to see about putting the cash back on I think that this is appropriate as long as she takes care of it and keeps it from getting wet. She is gonna have some drainage we can definitely pass this up with Drawtex and alginate to try  to prevent as much drainage as possible from causing the problems. With that being said I do want to at least try her with the cast between now and Tuesday. If there any issues we can't continue to use it then I will discontinue the use of the cast at that point. 01/08/19 on evaluation today patient actually appears to be doing very well as far as her foot ulcer specifically the great toe on the left is concerned. She did have an area of rubbing on the medial aspect of her left ankle which again is from the cast. Fortunately there's no signs of infection at this point in this appears to be a very slight skin  breakdown. The patient tells me she felt it rubbing but didn't think it was that bad. Fortunately there is no signs of active infection at this time which is good news. No fevers, chills, nausea, or vomiting noted at this time. 01/15/19 on evaluation today patient actually appears to be doing well in regard to her toe ulcer. Again as previous she seems to do well and she has the cast on which indicates to me that during the time she doesn't have a cast on she's putting way too much pressure on this region. Obviously I think that's gonna be an issue as with the current national emergency concerning the Covid-19 Virus it has been recommended that we discontinue the use of total contact casting by the chief medical officer of our company, Dr. Marinda. The reasoning is that if a patient becomes sick and cannot come into have the cast removed they could not just leave this on for an additional two weeks. Obviously the hospitals also do not want to receive patient's who are sick into the emergency department to potentially contaminate the region and spread the Covid-19 Virus among other sick individuals within the hospital system. Therefore at this point we are suspending the use of total contact cast until the current emergency subsides. This was all discussed with the patient today as well. 01/22/19 on evaluation today patient's wound on her left great toe appears to be doing slightly worse than previously noted last week. She tells me that she has been on this quite a bit in fact she tells me she's been awake for 38 straight hours. This is due to the fact that she's having to care for grandparents because nobody else will. She has been taking care of them for five the last seven days since I've seen her they both have dementia his is from a stroke and her grandmother's was progressive. Nonetheless she states even her mom who knows her condition and situation has only help two of those days to take care  of them she's been taking care of the rest. Fortunately there does not appear to be any signs of active infection in regard to her toe at this point although obviously it doesn't look as good as it did previous. I think this is directly related to her not taking off the pressure and friction by way of taking things easy. Though I completely understand what's going on. 01/29/19 on evaluation today patient's tools are actually appears to be showing some signs of improvement today compared to last week's evaluation as far as not necessarily the overall size of the wound but the fact that she has some new skin growth in between the two ends of the wound opening. Overall I feel like she has done well she states that she had a family member  give her what sounds to be a CAM walker boot which has been helpful as well. 02/05/19 on evaluation today patient's wound bed actually appears to be doing significantly better in regard to her overall appearance of the size of the wound. With that being said she is still having an issue with offloading efficiently enough to get this to close. Apparently there is some signs of infection at this point as well unfortunately. Previously she's done well of Augmentin  I really do not see anything that needs to be culture currently but there theme and cellulitis of the foot that I'm seeing I'm gonna go ahead and place her on an antibiotic today to try to help clear this up. 02/12/2019 on evaluation today patient actually appears to be doing poorly in regard to her overall wound status. She tells me she has been using her offloading shoe but actually comes in today wearing her tennis shoe with the AFO brace. Again as I previously discussed with her this is really not sufficient to allow the area to heal appropriately. Nonetheless she continues to be somewhat noncompliant and I do wonder based on what she has told my nurse in the past as to whether or not a good portion of this  noncompliance may be recreational drug and alcohol related. She has had a history of heroin overdose and this was fairly recently in the past couple of months that have been seeing her. Nonetheless overall I feel like her wound looks significantly worse today compared to what it FRANCISCA, LANGENDERFER A ([id number]) [id number] Page 5 of 21 was previous. She still has significant erythema despite the Augmentin  I am not sure that this is an appropriate medication for her infection I am also concerned that the infection may have gone down into her bone. 02/19/19 on evaluation today patient actually appears to be doing about the same in regard to her toe ulcer. Unfortunately she continues to show signs of bone exposure and infection at this point. There does not appear to be any evidence of worsening of the infection but I'm also not really sure that it's getting significantly better. She is on the Augmentin  which should be sufficient for the Staphylococcus aureus infection that she has at this point. With that being said she may need IV antibiotics to more appropriately treat this. We did have a discussion today about hyperbaric option therapy. 02/28/19 on evaluation today patient actually appears to be doing much worse in regard to the wound on her left great toe as compared to even my previous evaluation last week. Unfortunately this seems to be training in a pretty poor direction. Her toe was actually now starting to angle laterally and I can actually see the entire joint area of the proximal portion of the digit where is the distal portion of the digit again is no longer even in contact with the joint line. Unfortunately there's a lot more necrotic tissue around the edge and the toe appears to be showing signs of becoming gangrenous in my pinion. I'm very concerned about were things stand at this point. She did see infectious disease and they are planning to send in a prescription for  Sivextro  for her and apparently this has been approved. With that being said I don't think she should avoid taking this but at the same time I'm not sure that it's gonna be sufficient to save her toe at this point. She tells me that she still having to care for grandparents which I think is  putting quite a bit of strain on her foot and specifically the total area and has caused this to break down even to a greater degree than would've otherwise been expected. 03/05/19 on evaluation today patient actually appears to be doing quite well in regard to her toe all things considering. She still has bone exposed but there appears to be much less your thing on overall the appearance of the wound and the toe itself is dramatically improved. She still does have some issues currently obviously with infection she did see vascular as well and there concerned that her blood flow to the toad. For that reason they are setting up for an angiogram next week. 03/14/19 on evaluation today patient appears to be doing very poor in regard to her toe and specifically in regard to the ulceration and the fact that she's starting to notice the toe was leaning even more towards the lateral aspect and the complete joint is visible on the proximal aspect of the joint. Nonetheless she's also noted a significant odor and the tip of the toe is turning more dark and necrotic appearing. Overall I think she is getting worse not better as far as this is concerned. For that reason I am recommending at this point that she likely needs to be seen for likely amputation. READMISSION 03/19/2021 This is a patient that we cared for in this clinic for a prolonged period of time in 2019 and 2020 with a left foot and left first toe wound. 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 Lennie. In December 2021 she had an ulcer on her  right great toe as well as the fourth and fifth toes. 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 She also had an angiogram at that time and underwent angioplasty of the right anterior tibial artery. In any case she claims that the wound on the right foot is closed I did not look at this today which was probably an oversight although I think that should be done next week. After her surgery she developed a dehiscence but I do not see any follow-up of this. According to Dr. Honore last review that she was out of the area being cared for by another physician but recently came back to his attention. 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 A culture of this area showed E. coli apparently before she came back to see Dr. Lennie she was supposed to be receiving antibiotics but she did not really take them. Nor is she offloading this area at all. Finally her last hemoglobin A1c listed in epic was in March 2022 at 14.1 she says things are a lot better since then although I am not sure. 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 She had this wound at the time. During this admission she had E. coli sepsis a TEE was negative. 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 She is still a heavy cigarette smoker. She has a history of bipolar disorder seizure disorder ABI in our clinic was 1.05 6/1; left midfoot in the setting of  a TMA done previously. Round circular wound with a knuckle of protruding tissue. The problem is that the knuckle was not attached to any of the surrounding granulation and this probed proximally widely I  removed a large portion of this tissue. This wound goes with considerable undermining laterally. I do not feel any bone there was no purulence but this is a deep wound. 6/8; in spite of the debridement I did last week. She arrives with a wound looking exactly the same. A protruding knuckle of tissue nonadherent to most of the surrounding tissue. There is considerable depth around this from 6-12 o'clock at 2.7 cm and undermining of 1 cm. This does not look overtly infected and the x- ray I did last week was negative for any osseous abnormalities. We have been using silver collagen 6/15; deep tissue culture I did last week showed moderate staph aureus and moderate Pseudomonas. This will definitely require prolonged antibiotic therapy. The pathology on the protuberant area was negative for malignancy fungus etc. 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 We have been using silver collagen. I am going to be prescribing Levaquin for 2 weeks. Her CT scan of the foot is down for 7/5 6/22; CT scan of the foot on 7 5. She says she has hardware in the left leg from her previous fracture. She is on the Levaquin for the deep tissue culture I did that showed methicillin sensitive staph aureus and Pseudomonas. I gave her a 2-week supply and she will have another week. She arrives in clinic today with the same protuberant tissue however this is nonadherent to the tissue surrounding it. I am really at a loss to explain this unless there is underlying deep tissue infection 6/29; patient presents for 1 week follow-up. She has been using collagen to the wound bed. She reports taking her antibiotics as prescribed.She has no complaints or issues today. She denies signs of infection. 7/6; patient presents for one week followup. She has been using collagen to the wound bed. She states she is taking Levaquin however at times she is not able to keep it down. She  denies signs of infection. 7/13; patient presents for 1 week follow-up. She has been using silver alginate to the wound bed. She still has nausea when taking her antibiotics. She denies signs of infection. 7/20; patient presents for 1 week follow-up. She has been using silver alginate with gentamicin cream to the wound bed. She denies any issues and has no complaints today. She denies signs of infection. 7/27; patient presents for 1 week follow-up. She continues to use silver alginate with gentamicin cream to the wound bed. She reports starting her antibiotics. She has no issues or complaints. Overall she reports stability to the wound. 8/3; patient presents for 1 week follow-up. She has been using silver alginate with gentamicin cream to the wound bed. She reports completing all antibiotics. She has no issues or complaints today. She denies signs of infection. 8/17; patient presents for 2-week follow-up. He is to use silver alginate to the wound bed. She has no issues or complaints today. She denies signs of infection. She reports her pain has improved in her foot since last clinic visit 8/24; patient presents for 1 week follow-up. She continues to use silver alginate to the wound bed. She has no issues or complaints. She denies signs of infection. Pain is stable. 9/7; patient presents for follow-up. She missed her last week appointment due to feeling  ill. She continues to use silver alginate. She has a new wound to the right lower extremity that is covered in eschar. She states It occurred over the past week and has no idea how it started. She currently denies signs of infection. 9/14; patient presents for follow-up. T the left foot wound she has been using gentamicin cream and silver alginate. T the right lower extremity wound she has o o been keeping this covered and has not obtain Santyl. 9/21; patient presents for follow-up. She reports using gentamicin cream and silver alginate to the left  foot and Santyl to the right lower extremity wound. She has no issues or complaints today. She denies signs of infection. 9/28; patient presents for follow-up. She reports a new wound to her right heel. She states this occurred a few days ago and is progressively gotten worse. She has been trying to clean the area with a Q-tip and Santyl. She reports stability in the other 2 wounds. She has been using gentamicin cream and silver alginate to the left foot and Santyl to the right lower extremity wound. 10/12; patient presents for follow-up. She reports improvement to the wound beds. She is seeing vein and vascular to discuss the potential of a left BKA. She states they are going to do an arteriogram. She continues to use silver alginate with dressing changes to her wounds. SAFINA, HUARD A ([id number]) [id number] Page 6 of 21 11/2; patient presents for follow-up. She states she has not been doing dressing changes to the wound beds. She states she is not able to offload the areas. She reports chronic pain to her left foot wound. 11/9; patient presents for follow-up. She came in with only socks on. She states she forgot to put on shoes. It is unclear if she is doing any dressing changes. She currently denies systemic signs of infection. 11/16; patient presents for follow-up. She came again only with socks on. She states she does not wear shoes ever. It is unclear if she does dressing changes. She currently denies systemic signs of infection. 11/23; patient presents for follow-up. She wore her shoes today. It still unclear exactly what dressing she is using for each wound but she did states she obtained Dakin's solution and has been using this to the left foot wound. She currently denies signs of infection. 11/30; patient presents for follow-up. She has no issues or complaints today. She currently denies signs of infection. 12/7; patient presents for follow-up. She has no  issues or complaints today. She has been using Hydrofera Blue to the right heel wound and Dakin solution to the left foot wound. Her right anterior leg wound is healed. She currently denies signs of infection. 12/14; patient presents for follow-up. She has been using Hydrofera Blue to the right heel and Dakin's to the left foot wounds. She has no issues or complaints today. She denies signs of infection. 12/21; patient presents for follow-up. She reports using Hydrofera Blue to the right heel and Dakin's to the left foot wound. She denies signs of infection. 12/28; patient presents for follow-up. She continues to use Dakin's to the left foot wound and Hydrofera Blue to the right heel wound. She denies signs of infection. 1/4; patient presents for follow-up. She has no issues or complaints today. She denies signs of infection. 1/11; patient presents for follow-up. It is unclear if she has been dressing these wounds over the past week. She currently denies signs of infection. 1/18; patient presents for follow-up. She states she  has been using Dakin's wet-to-dry dressings to the left foot. She has been using Hydrofera Blue to the right foot foot wound. She states that the anterior right leg wound has reopened and draining serous fluid. She denies signs of infection. 1/25; patient presents for follow-up. She has no issues or complaints today. 2/1; patient presents for follow-up. She has no issues or complaints today. She denies signs of infection. 2/8; patient presents for follow-up. She has lost her surgical shoes. She did not have a dressing to the right heel wound. She currently denies signs of infection. 2/15; patient presents for follow-up. She reports more pain to the right heel today. She denies purulent drainage Or fever/chills 2/22; patient presents for follow-up. She reports taking clindamycin  over the past week. She states that she continues to have pain to her right heel. She reports  purulent drainage. Readmission 03/16/2022 [name] Tonkinson is a 48 year old female with a past medical history of type 2 diabetes, osteomyelitis to her feet, chronic systolic heart failure and bipolar disorder that presents to the clinic for bilateral feet wounds and right lower extremity wound. She was last seen in our clinic on 12/15/2021. At that time she had purulent drainage coming out of her right plantar foot and I recommended she go to the ED. She states she went to [facility] and has been there for the past 3 months. I cannot see the records. She states she had OR debridement and was on several weeks of IV antibiotics while inpatient. Since discharge she has not been taking care of the wound beds. She had nothing on her feet other than socks today. She currently denies signs of infection. 5/31; patient presents for follow-up. She has been using Dakin's wet-to-dry dressings to the wound beds on her feet bilaterally and antibiotic ointment to the right anterior leg wound. She had a wound culture done at last clinic visit that showed moderate Pseudomonas aeruginosa sensitive to ciprofloxacin. She currently denies systemic signs of infection. 6/14; patient presents for follow-up. She received Keystone 5 days ago and has been using this on the wound beds. She states that last week she had to go to the hospital because she had increased warmth and erythema to the right foot. She was started on 2 oral antibiotics. She states she has been taking these. She currently denies systemic signs of infection. She has no issues or complaints today. 6/21; patient presents for follow-up. She states she has been using Keystone antibiotics to the wound beds. She has no issues or complaints today. She denies signs of infection. 6/28; patient presents for follow-up. She has been using Keystone antibiotics to the wound beds. She has no issues or complaints today. 7/12; patient presents for follow-up.  Has been using Keystone antibiotics to the wound beds with calcium  alginate. She has no issues or complaints today. She never followed up with her orthopedic surgeon who did the OR debridement to the right foot. We discussed the total contact cast for the left foot and patient would like to do this next week. 7/19; patient presents for follow-up. She has been using Keystone antibiotics with calcium  alginate to the wound beds. She has no issues or complaints today. Patient is in agreement to do the total contact cast of the left foot today. She knows to return later this week for the obligatory cast change. 05-13-2022 upon evaluation today patient's wound which she has the cast of the left leg actually appears to be doing significantly better. Fortunately  I do not see any signs of active infection locally or systemically which is great news and overall I am extremely pleased with where we stand currently. 7/26; patient presents for follow-up. She has a cast in place for the past week. She states it irritated her shin. Other than that she tolerated the cast well. She states she would like a break for 1 week from the cast. We have been using Keystone antibiotic and Aquacel to both wound beds. She denies signs of infection. 8/2; patient presents for follow-up. She has been using Keystone and Aquacel to the wound beds. She denies any issues and has no complaints. She is agreeable to have the cast placed today for the left leg. 06-03-2022 upon evaluation today patient appears to be doing well with regard to her wound she saw some signs of improvement which is great news. Fortunately I do not see any evidence of active infection locally or systemically at this time which is great news. No fevers, chills, nausea, vomiting, or diarrhea. 8/16; patient presents for follow-up. She has no issues or complaints today. We have been using Keystone and Aquacel to the wound beds. The left lower extremity is in a total  contact cast. She is tolerated this well. 8/23; patient presents for follow-up. She has had the total contact cast on the left leg for the past week. Unfortunately this has rubbed and broken down the skin to the medial foot. She currently denies signs of infection. She has been using Keystone antibiotic to the right plantar foot wound. KATARINA, RIEBE A ([id number]) [id number] Page 7 of 21 8/30; patient presents for follow-up. We have held off on the total contact cast for the left leg for the past week. Her wound on the left foot has improved and the previous surrounding breakdown of skin has epithelialized. She has been using Keystone antibiotic to both wound beds. She has no issues or complaints today. She denies signs of infection. 9/6; patient presents for follow-up. She has ordered her's Keystone antibiotic and this is arriving this week. She has been doing Dakin's wet-to-dry dressings to the wound beds. She denies signs of infection. She is agreeable to the total contact cast today. 9/13; patient presents for follow-up. She states that the cast caused her left leg shin to be sore. She would like to take a break from the cast this week. She has been using Keystone antibiotic to the right plantar foot wound. She denies signs of infection. 9/20; patient presents for follow-up. She has been using Keystone antibiotics to the wound beds with calcium  alginate to the right foot wound and Hydrofera Blue to the left foot wound. She is agreeable to having the cast placed today. She has been approved for Apligraf and we will order this for next clinic visit. 9/27; patient presents for follow-up. We have been using Keystone antibiotic with Hydrofera Blue to the left foot wound under a total contact cast. T the right o foot wound she has been using Keystone antibiotic and calcium  alginate. She declines a total contact cast today. Apligraf is available for placement and she would  like to proceed with this. 07-28-2022 upon evaluation today patient appears to be doing well currently in regard to her wound. She is actually showing signs of significant improvement which is great news. Fortunately I do not see any evidence of active infection locally nor systemically at this time. She has been seeing Dr. Rosan and to be honest has been doing very well with the  cast. Subsequently she comes in today with a cast on and we did reapply that today as well. She did not really want to she try to talk me out of that but I explained that if she wanted to heal this is really the right way to go. Patient voiced understanding. In regard to her right foot this is actually a lot better compared to the last time I saw her which is also great news. 10/11; patient presents for follow-up. Apligraf and the total contact cast was placed to the left leg at last clinic visit. She states that her right foot wound had burning pain to it with the placement of Apligraf to this area. She has been doing [place] over this area. She denies signs of infection including increased warmth, erythema or purulent drainage. 11/1; 3-week follow-up. The patient fortunately did not have a total contact cast or an Apligraf and on the left foot. She has been using Keystone ABD pads and kerlix and her own running shoes She arrives in clinic today with thick callus and a very poor surface on the left foot on the right nonviable skin subcutaneous tissue and a deep probing hole. 11/15; patient missed her last clinic appointment. She states she has not been dressing the wound beds for the past 2 weeks. She states that at she had a new roommate but is now going back to live with her mother. Apparently its been a distracting 2 weeks. Patient currently denies signs of infection. 11/22; patient presents for follow-up. She states she has been using Keystone antibiotic and Dakin's wet-to-dry dressings to the wound beds. She is  agreeable for cast placement today. We had ordered Apligraf however this has not been received by our facility. 11/29; Patient had a total contact cast placed at last clinic visit and she tolerated this well. We were using silver alginate under the cast. Patient's been using Keystone antibiotic with Aquacel to the right plantar foot wound. She has no issues or complaints today. Apligraf is available for placement today. Patient would like to proceed with this. 12/6; patient presents for follow-up. She had Apligraf placed in standard fashion last clinic visit under the total contact cast to the left lower extremity. She has been using Keystone antibiotic and Aquacel to the right plantar foot wound. She has no issues or complaints today. 12/13; patient presents for follow-up. She has finished 5 Apligraf placements. Was told she would not qualify for more. We have been doing a total contact cast to the left lower extremity. She has been using Keystone antibiotic and Aquacel to the right plantar foot wound. She has no issues or complaints today. 12/20; patient presents for follow-up. We have been using Hydrofera Blue with Keystone antibiotic under a total contact cast of the left lower extremity. She reports using Keystone antibiotic and silver alginate to the right heel wound. She has no issues or complaints today. 12/27; patient presents with a healthy wound on the left midfoot. We have Apligraf to apply that to that more also using a total contact cast. On the right we are using Keystone and silver alginate. She is offloading the right heel with a surgical shoealthough by her admission she is on her feet quite a bit 1/3; patient presents for follow-up. Apligraf was placed to the wound beds last clinic visit. She was placed in a total contact cast to the left lower extremity. She declines a total contact cast today. She states that her mother is in the hospital  and she cannot adequately get around with  the cast on. 1/10; patient presents for follow-up. She declined the total contact cast at last clinic visit. Both wounds have declined in appearance. She states that she has been on her feet and not offloading the wound beds. She currently denies signs of infection. 1/17; patient presents for follow-up. She had the total contact cast along with Apligraf placed last week to the left lower extremity. She tolerated this well. She has been using Aquacel Ag and Keystone antibiotic to the right heel wound. She currently denies signs of infection. She has no issues or complaints today. 1/24; patient presents for follow-up. We have been using Apligraf to the left foot wound along with a total contact cast. She has done well with this. T the right o heel wound she has been using Aquacel Ag and Keystone antibiotic ointment. She has no issues or complaints today. She denies signs of infection. 1/31; patient presents for follow-up. We have been using Apligraf to the left foot wound along with the total contact cast. She continues to do well with this. To the right heel we have been using Aquacel Ag and Keystone antibiotic ointment. She has no issues or complaints today. 12-01-2022 upon evaluation patient is seen today on my schedule due to the fact that she unfortunately was in the hospital yesterday. Her cast needed to come off the only reason she is out of the hospital is due to the fact that they would not take it off at the hospital which is somewhat bewildered reading to me to be perfectly honest. I am not certain why this was but either way she was released and then was placed on my schedule today in order to get this off and reapply the total contact casting as appropriate. I do not have an Apligraf for her today it was applied last week and today's actually expired yesterday as there was some scheduling conflicts with her being in the hospital. Nonetheless we do not have that for reapplication today but the  good news that she is not draining too much and the Apligraf can go for up to 2 weeks so I am going to go ahead and reapply the total contact casting but we are going to leave the Apligraf in place. 2/14; patient presents for follow-up. T the left leg she has had the total contact cast and Apligraf for the past week. She has had no issues with this. T the o o right heel she has been using antibiotic ointment and Aquacel Ag. 2/21; patient presents for follow-up. We have been using Apligraf and a total contact cast to the left lower extremity. She is tolerated this well. Unfortunately she is not approved for any more Apligraf per insurance. She has been using antibiotic ointment and Aquacel Ag to the right foot. She has no issues or complaints today. 2/28; patient presents for follow-up. We have been using Hydrofera Blue and antibiotic ointment under the total contact cast to the left lower extremity. He has been using Aquacel Ag with antibiotic ointment to the right plantar foot. She has no issues or complaints today. 3/6; patient presents for follow-up. She did not obtain her gentamicin ointment. She has been using Aquacel Ag to the right plantar foot wound. We have been using Hydrofera Blue with antibiotic ointment under the total contact cast to the left lower extremity. She has no issues or complaints today. 3/12; patient presents for follow-up. She has been using gentamicin ointment and  Aquacel Ag to the right plantar foot wound. We have been using Hydrofera Blue with antibiotic ointment under the total contact cast on the left lower extremity foot wound. She has no issues or complaints today. LARISSA, PEGG A ([id number]) [id number] Page 8 of 21 3/20; patient presents for follow-up. She has been using gentamicin ointment and Aquacel Ag to the right plantar foot wound. We have been using Hydrofera Blue with antibiotic ointment under the total contact cast to the left  lower extremity wound. She has no issues or complaints today. 3/27; patient presents for follow-up. We have been using antibiotic ointment and Aquacel Ag to the right plantar foot wound. We have been using Hydrofera Blue with antibiotic ointment under the total contact cast to the left lower extremity. Both wounds are smaller. 01-24-2023 upon evaluation today patient actually appears to be doing better compared to last time I saw her. It has been several weeks since I last saw her but nonetheless I think the total contact casting is doing a good job. Fortunately I do not see any evidence of infection or worsening overall which is great news and in general I do believe that she is moving in the appropriate direction. It has been a very long road but nonetheless I feel like she is nearing the end at least in regard to the left foot and even the right foot looks much better to be perfectly honest. 01-31-2023 upon evaluation today patient appears to be doing well currently in regard to her wounds. She has been require some sharp debridement which I think is probably bone both sides to remove a lot of callus that has built up at this point. I discussed that with her today. Also think total contact cast is doing well for the left foot Emina continue that as well. 4/17; patient presents for follow-up. We have been doing Hydrofera Blue with antibiotic ointment under the total contact cast to the left lower extremity and Aquacel Ag with antibiotic ointment to the right foot wound. She has been using her offloading heel shoe here. 4/24; patient presents for follow-up. We have been doing Hydrofera Blue with antibiotic ointment under the total contact cast to the left lower extremity and Aquacel Ag with antibiotic ointment to the right foot wound. She has been using her offloading heel shoe here. Wounds are smaller with slough accumulation. 5/1; patient presents for follow-up. We have been doing Hydrofera Blue with  antibiotic ointment under the total contact cast to the left lower extremity and Aquacel Ag with antibiotic ointment to the right foot wound. Left foot wound is stable and right foot wound appears smaller. 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 On the left she is using Hydrofera Blue topical antibiotic under a total contact cast, on the right plantar heel gentamicin Aquacel Ag. According to our intake nurse both are making slow but steady improvements. 5/15; patient presents for follow-up. We have been doing Hydrofera Blue with antibiotic ointment under the total contact cast on the left and antibiotic ointment with Aquacel Ag on the right. Unfortunately the left foot wound has declined in size and appearance. We also do not have the total contact cast available in office today. Patient denies signs of infection. 5/22; patient presents for follow-up. We have been doing Hydrofera Blue and antibiotic ointment to the left foot and Aquacel Ag with antibiotic ointment to the right foot. We took a break from the cast since we did not have it placed at last week. Wounds  look stable if not slightly improved. 03-27-2023 patient presents today she has had this For Most 2 Weeks Because She Was in the Hospital and They Did Not T It off. Again That Is Really Not ake Something That She Could Control but Nonetheless We Are Seeing Her at This Point Today for Reevaluation. We Do Want to Go and See about Reapply the Cast. Again We Had All Set Everything up to Go Ahead and Do This When We Went to Get the Cast Only to Realize That Not Had Come in and We Were out at the Moment. With That Being [place] We Will Get Have T Get the Exam before We Can Reapply the T Contact Casting Going Forward. o otal 6/12; patient presents for follow-up. She has been using Hydrofera Blue to the left foot wound and Aquacel Ag to the right foot wound. No cast was available last week placed on the patient so she has been offloading with her  surgical shoe. She has no issues or complaints today. 6/19; patient presents for follow-up. We have been using Hydrofera Blue to the left foot wound under the total contact cast and Aquacel Ag to the right wound. She has no issues or complaints today. The wounds are smaller. 6/26; patient presents for follow-up. We have been using Hydrofera Blue to the left foot wound under the total contact cast and Aquacel Ag to the right foot wound. Wounds are smaller. Patient has no issues or complaints today. 7/3; patient presents for follow-up. We have been using Hydrofera Blue to the left foot wound under the total contact cast and Aquacel Ag to the right foot wound. Wounds Appear smaller. No signs of infection. 7/10; patient presents for follow-up. We have been using Hydrofera Blue to the left foot wound under the total contact cast and Aquacel Ag to the right foot wound. Left foot wound is smaller. Right foot wound is stable. 7/17; patient presents for follow-up. We have been using Hydrofera Blue to the left foot under the total contact cast and Aquacel Ag to the right foot wound. She has no issues or complaints today. 7/24; patient presents for follow-up. We have been using Hydrofera Blue to the left foot under the total contact cast and Aquacel Ag to the right foot wound. Unfortunately she was bit by her dog over the weekend and developed a wound to the right lower leg. She has been keeping the area covered. 7/31; patient presents for follow-up. We have been using Hydrofera Blue to the left foot under the total contact cast and Aquacel Ag to the right foot wound and mupirocin  ointment to the right anterior leg wound. She started her oral antibiotics 2 days ago. 8/7; patient presents for follow-up. We have been using Hydrofera Blue and antibiotic ointment to the left foot under the total contact cast and Aquacel Ag to the right foot wound. Antibiotic ointment to the right anterior leg wound. She completed  her oral antibiotics. She has no issues or complaints today. Wounds continue to improve albeit slowly. 8/14; 8/14; patient has 3 wounds 1 on the left plantar heel 1 on the right plantar heel and a small wound on the right anterior lower leg. We have been using Hydrofera Blue in the left heel Aquacel on the right heel. The left heel has been in a total contact cast 8/21; patient presents for follow-up. We have been using Hydrofera Blue to the left foot wound under the total contact cast. T the right heel patient has  been o using Aquacel Ag. T the anterior leg wound antibiotic ointment. All wounds have healed. o 8/28; patient presents for follow-up. She has been using her surgical shoe with offloading felt pads. Her wounds have remained closed. 9/11; patient presents for final follow-up to assure that her wounds have remained closed. Unfortunately the left foot wound has opened again. But she has been wearing her Surgical shoe for offloading with felt pad. 07-14-2023 upon evaluation today patient appears to be doing well currently in regard to her wound. She has been tolerating the dressing changes without complication with total contact casting. I am going to be helping take care of her at this point as she is transferring from Dr. Rosan to me since Dr. Rosan is on maternity leave. 07-24-2023 upon evaluation today patient appears to be doing decently well currently in regard to her wound. She is actually showing signs of improvement in regard to the left foot and I feel like this is much better than last week no debridement even necessary today. We may have to perform some slight debridement come next week we will see how things go. Monitor for any signs of worsening overall and obviously if anything changes she should be letting us  know. 08-01-2023 upon evaluation today patient appears to be doing excellent in regard to her feet. The right foot/heel area is completely healed she does have  some callus buildup I am going to trim this down a little bit I do not want to build up to the point of cracking she is doing well with the urea  cream. With regard to the left heel she is doing well with casting I am going to perform some debridement here to clear away before reapplying the cast and I think she is really doing quite well though. RIYA, HUXFORD A ([id number]) [id number] Page 9 of 21 08-07-2023 upon evaluation patient's wound actually showed signs of excellent improvement and actually very pleased with where we stand I do believe that she is making good headway towards complete closure which is great news. 08-14-2023 upon evaluation today patient appears to be doing well currently in regard to her wound. She actually is going require a little bit of debridement but fortunately nothing too significant today actually feel like she is doing much better. This is mainly to remove callus on the little slough and biofilm down to good subcutaneous tissue. In general I think that she is making excellent headway here. 08-21-2023 upon evaluation today patient appears to be doing well currently in regard to her wound which is actually showing signs of significant improvement. I am extremely pleased with where things stand I do not see any signs of infection at this time which is great news and overall I do think that we are making headway towards complete closure which is also excellent news. 08-30-2023 upon evaluation today patient's wound is showing signs of continued improvement. Fortunately I do not see any evidence of worsening overall and I believe that the patient is making excellent headway towards closure. I am extremely pleased with where we stand. 09-04-2023 upon evaluation today patient appears to be doing well currently in regard to her foot ulcer. She has been tolerating the dressing changes without complication. Fortunately I do not see any signs of active  infection locally or systemically at this time which is great news. No fevers, chills, nausea, vomiting, or diarrhea. 09-11-2023 upon evaluation today patient appears to be doing well currently in regard to her wound on the left  foot. I feel like she is actually showing signs of improvement I feel like the collagen is doing a good job. With that being said unfortunately she has an area where there is a spot on the right foot that has not really open but at least there looks like there may have been something that popped off of this area. 09-18-2023 upon evaluation today patient appears to be doing well currently in regard to her wound although to be honest she still continues to have issues here with the wound not healing as rapidly as I would expect. She does a lot of walking on the cast from what I am hearing today and to be honest as I mention to her previously this cast is not exactly appropriate for her based on the size of her leg is technically too small and I think this is causing issues with rubbing and therefore callus buildup that would not otherwise occur with a properly fitting cast. I discussed that with her today and I think that potentially having a offloading shoe would be better specifically a front offloading boot. 09-25-2023 upon evaluation patient's wound is showing signs of improvement which is great news and in general I do feel like that she is really doing quite well. I do not see any signs of active infection at this time which is excellent and in general I think that we are moving in the right direction here. She did get the offloading boot off of [employer] that working to get a try it is a front off loader which I think should do quite well and we will get a see how that does over the next week. 10-02-2023 upon evaluation today patient's wound actually showing signs of good improvement. I actually feel like it is overall looking better there is still quite a bit of callus but I  did go ahead and perform debridement today to clear this away. She in general seems to be doing quite well. 10-09-2023 upon evaluation today patient unfortunately appears to be doing worse in regard to her wound she is showing signs of infection and this is not good. Fortunately I do not see any evidence of deeper wound but at the same time I do feel like there is some area underneath the callus where this has spread again we have previously gotten that healed. I think that she is probably going to need some oral antibiotic therapy. 10-17-2023 upon evaluation today patient unfortunately appears to be doing better with regard to the infection but has a lot more callus buildup. I really do not think that the boot is working out well for her. 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 Either way I do not believe at this point that this is going to be the appropriate way to continue currently. 12/31; patient seen in follow-up. 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 2 weeks ago she was felt to have an infection. A swab culture for PCR showed significant copies of Pseudomonas, group A strep, anaerobic strep and Enterococcus faecalis. She was given a 2-week course of Cipro which she has now finished. She was in a total contact cast and doing well but she has not had the cast on and almost a month now. We have been using Hydrofera Blue on the wound Electronic Signature(s) Signed: 10/24/2023 4:49:51 PM By: Rufus Sharper MD Entered  By: Rufus Sharper on 10/24/2023 16:43:11 -------------------------------------------------------------------------------- Physical Exam Details Patient Name: Date of Service: BLEN, RANSOME 10/24/2023 3:45 PM Medical Record Number: [id number] Patient Account Number: [id number] Date of Birth/Sex: Treating RN: [birth year] (48 y.o. [name] Claudene Blossom Primary Care Provider:  [name] Other Clinician: [name] Referring Provider: Treating Provider/Extender: RO BSO N, MICHA EL KANDICE Rosan, [name] Weeks in Treatment: 67 Constitutional Patient is hypotensive.But does not appear acutely unwell. Pulse regular and within target range for patient.SABRA Respirations regular, non-labored and within target range.. Temperature is normal and within the target range for the patient.. 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 The wound itself does not have anything ominous. There is nothing that probes to bone the granulation really does not look too bad. She has copious amounts of thick callus around this. She is tender around the wound area when palpating through the ALESSIA, GONSALEZ A ([id number]) [phone].pdf Page 10 of 21 callus but there is no overt infection. I used a #5 curette to remove as much of this callus as I could but I count encountered no purulence no fluid pockets etc. Electronic Signature(s) Signed: 10/24/2023 4:49:51 PM By: Rufus Sharper MD Entered By: Rufus Sharper on 10/24/2023 16:44:30 -------------------------------------------------------------------------------- Physician Orders Details Patient Name: Date of Service: ELSIE [name] RAYFIELD RONAL 10/24/2023 3:45 PM Medical Record Number: [id number] Patient Account Number: [id number] Date of Birth/Sex: Treating RN: 14-Jun-1976 (47 y.o. [name] Claudene Blossom Primary Care Provider: [name] Other Clinician: [name] Referring Provider: Treating Provider/Extender: RO BSO N, MICHA EL KANDICE Rosan, [name] Weeks in Treatment: 3 The following information was scribed by: [name] The information was scribed for: RUFUS SHARPER KANDICE Verbal / Phone Orders: No Diagnosis Coding Follow-up Appointments Return Appointment in 1 week. Bathing/ Shower/ Hygiene May shower; gently cleanse wound with antibacterial soap, rinse and pat dry prior to dressing  wounds No tub bath. Off-Loading Open toe surgical shoe - right foot Other: - front offloading boot left foot Medications-Please add to medication list. ntibiotics - restart Augmentin  P.O. A Wound Treatment Wound #12R - Foot Wound Laterality: Plantar, Left, Medial Cleanser: Vashe 5.8 (oz) Every Other Day/30 Days Discharge Instructions: Use vashe 5.8 (oz) as directed Topical: Mupirocin  Ointment Every Other Day/30 Days Discharge Instructions: Apply as directed by provider. Prim Dressing: Hydrofera Blue Ready Transfer Foam, 2.5x2.5 (in/in) Every Other Day/30 Days ary Discharge Instructions: Apply Hydrofera Blue Ready to wound bed as directed Secondary Dressing: Gauze Every Other Day/30 Days Discharge Instructions: As directed: dry, moistened with saline or moistened with Dakins Solution Secured With: Medipore T - 38M Medipore H Soft Cloth Surgical T ape ape, 2x2 (in/yd) Every Other Day/30 Days Secured With: Kerlix Roll Sterile or Non-Sterile 6-ply 4.5x4 (yd/yd) Every Other Day/30 Days Discharge Instructions: Apply Kerlix as directed Secured With: [employer], Latex-free, Size 5, Small-Head / Shoulder / Thigh Every Other Day/30 Days Patient Medications llergies: erythromycin base, Neosporin  (neo-bac-polym) A Notifications Medication Indication Start End wound infection left 10/24/2023 Augmentin  Mcadams, Chaselyn A ([id number]) [phone].pdf Page 11 of 21 foot DOSE oral 500 mg-125 mg tablet - 1 tablet oral once daily (after dialysis on dialysis days) for 10 days Electronic Signature(s) Signed: 10/24/2023 4:49:03 PM By: Rufus Sharper MD Entered By: Rufus Sharper on 10/24/2023 16:49:02 -------------------------------------------------------------------------------- Problem List Details Patient Name: Date of Service: ELSIE [name] RAYFIELD A. 10/24/2023 3:45 PM Medical Record Number: [id number] Patient Account Number: [id number] Date of Birth/Sex: Treating  RN: [birth year] (47 y.o. [name] Claudene Blossom Primary Care Provider: Rosan,  Cheryal Other Clinician: [name] Referring Provider: Treating Provider/Extender: RO BSO N, MICHA EL KANDICE Rosan Cheryal Weeks in Treatment: 70 Active Problems ICD-10 Encounter Code Description Active Date MDM Diagnosis E11.621 Type 2 diabetes mellitus with foot ulcer 03/16/2022 No Yes L97.528 Non-pressure chronic ulcer of other part of left foot with other specified 03/16/2022 No Yes severity E11.42 Type 2 diabetes mellitus with diabetic polyneuropathy 03/16/2022 No Yes Z79.4 Long term (current) use of insulin  02/22/2023 No Yes N18.6 End stage renal disease 07/14/2023 No Yes Z99.2 Dependence on renal dialysis 07/14/2023 No Yes L84 Corns and callosities 08/01/2023 No Yes Inactive Problems Resolved Problems ICD-10 Code Description Active Date Resolved Date L97.512 Non-pressure chronic ulcer of other part of right foot with fat layer exposed 03/16/2022 03/16/2022 HOLLAN, PHILIPP A ([id number]) [phone].pdf Page 12 of 21 L97.811 Non-pressure chronic ulcer of other part of right lower leg limited to breakdown of skin 03/16/2022 03/16/2022 W54.0XXA Bitten by dog, initial encounter 05/17/2023 05/17/2023 S81.801A Unspecified open wound, right lower leg, initial encounter 05/17/2023 05/17/2023 Z88.377 Type 2 diabetes mellitus with other skin ulcer 05/17/2023 05/17/2023 Electronic Signature(s) Signed: 10/24/2023 4:49:51 PM By: Rufus Sharper MD Entered By: Rufus Sharper on 10/24/2023 16:41:48 -------------------------------------------------------------------------------- Progress Note Details Patient Name: Date of Service: ELSIE [name] QUAKER RONAL 10/24/2023 3:45 PM Medical Record Number: [id number] Patient Account Number: [id number] Date of Birth/Sex: Treating RN: 01-09-1976 (47 y.o. [name] Claudene Blossom Primary Care Provider: Rosan Cheryal Other Clinician: [name] Referring Provider: Treating  Provider/Extender: RO BSO N, MICHA EL KANDICE Rosan, [name] Weeks in Treatment: 90 Subjective History of Present Illness (HPI) 01/18/18-She is here for initial evaluation of the left great toe ulcer. She is a poor historian in regards to timeframe in detail. She states approximately 4 weeks ago she lacerated her toe on something in the house. She followed up with her primary care who placed her on Bactrim and ultimately a second dose of Bactrim prior to coming to wound clinic. She states she has been treating the toe with peroxide, Betadine  and a Band-Aid. She did not check her blood sugar this morning but checked it yesterday morning it was 327; she is unaware of a recent A1c and there are no current records. She saw Dr. she would've orthopedics last week for an old injury to the left ankle, she states he did not see her toe, nor did she bring it to his attention. She smokes approximately 1 pack cigarettes a day. Her social situation is concerning, she arrives this morning with her mother who appears extremely intoxicated/under the influence; her mother was asked to leave the room and be monitored by the patient's grandmother. The patient's aunt then accompanied the patient and the room throughout the rest of the appointment. We had a lengthy discussion regarding the deleterious effects of uncontrolled hyperglycemia and smoking as it relates to wound healing and overall health. She was strongly encouraged to decrease her smoking and get her diabetes under better control. She states she is currently on a diet and has cut down her [facility] consumption. The left toe is erythematous, macerated and slightly edematous with malodor present. The edema in her left foot is below her baseline, there is no erythema streaking. We will treat her with Santyl, doxycycline ; we have ordered and xray, culture and provided a Peg assist surgical shoe and cultured the wound. 01/25/18-She is here in follow-up evaluation for  a left great toe ulcer and presents with an abscess to her suprapubic area. She states her blood sugars remain  elevated, feeling sick and if levels are below 250, but she is trying. She has made no attempt to decrease her smoking stating that we can't take away her food in her cigarettes. She has been compliant with offloading using the PEG assist you. She is using Santyl daily. the culture obtained last week grew staph aureus and Enterococcus faecalis; continues on the doxycycline  and Augmentin  was added on Monday. The suprapubic area has erythema, no femoral variation, purple discoloration, minimal induration, was accessed with a cotton tip applicator with sanguinopurulent drainage, this was cultured, I suspect the current antibiotic treatment will cover and we will not add anything to her current treatment plan. She was advised to go to urgent care or ER with any change in redness, induration or fever. 02/01/18-She is here in follow-up evaluation for left great toe ulcers and a new abdominal abscess from last week. She was able to use packing until earlier this week, where she forgot it was there. She states she was feeling ill with GI symptoms last week and was not taking her antibiotic. She states her glucose levels have been predominantly less than 200, with occasional levels between 200-250. She thinks this was contributing to her GI symptoms as they have resolved without intervention. There continues to be significant laceration to left toe, otherwise it clinically looks stable/improved. There is now less superficial opening to the lateral aspect of the great toe that was residual blister. We will transition to Hydrofera Blue to all wounds, she will continue her Augmentin . If there is no change or deterioration next week for reculture. 02/08/18-She is here in follow-up evaluation for left great toe ulcer and abdominal ulcer. There is an improvement in both wounds. She has been wrapping her  left toe with coban, not by our direction, which has created an area of discoloration to the medial aspect; she has been advised to NOT use coban secondary to her neuropathy. She states her glucose levels have been high over this last week ranging from 200-350, she continues to smoke. She admits to being less compliant with her offloading shoe. We will continue with same treatment plan and she will follow-up next week. 02/15/18-She is here in follow-up evaluation for left great toe ulcer and abdominal ulcer. The abdominal ulcer is epithelialized. The left great toe ulcer is improved and all injury from last week using the Coban wrap is resolved, the lateral ulcer is healed. She admits to noncompliance with wearing offloading shoe and admits to glucose levels being greater than 300 most of the week. She continues to smoke and expresses no desire to quit. There is one area medially that probes deeper than it has historically, erythema to the toe and dorsal foot has consistently waxed and waned. There is no overt signs of cellulitis or infection but we will culture the wound for any occult infection given the new area of depth and erythema. We will hold off on sensitivities for initiation of antibiotic therapy. 02/22/18-She is here in follow up evaluation for left great toe ulcer. There is overall significant improvement in both wound appearance, erythema and edema with changes made last week. She was not initiated on antibiotic therapy. Culture obtained last week showed oxacillin sensitive staph aureus, sensitive to JOHNNETTE, LAUX A ([id number]) [phone].pdf Page 13 of 21 clindamycin . Clindamycin  has been called into the pharmacy but she has been instructed to hold off on initiation secondary to overall clinical improvement and her history of antibiotic intolerance. She has been instructed to contact the  clinic with any noted changes/deterioration and the wound, erythema, edema  and/or pain. She will follow-up next week. She continues to smoke and her glucose levels remain elevated >250; she admits to compliance with offloading shoe 03/01/18 on evaluation today patient appears to be doing fairly well in regard to her left first toe ulcer. She has been tolerating the dressing changes with the Hydrofera Blue Dressing without complication and overall this has definitely showed signs of improvement according to records as well is what the patient tells me today. I'm very pleased in that regard. She is having no pain today 03/08/18 She is here for follow up evaluation of a left great toe ulcer. She remains non-compliant with glucose control and smoking cessation; glucose levels consistently >200. She states that she got new shoe inserts/peg assist. She admits to compliance with offloading. Since my last evaluation there is significant improvement. We will switch to prisma at this time and she will follow up next week. She is noted to be tachycardic at this appointment, heart rate 120s; she has a history of heart rate 70-130 according to our records. She admits to extreme agitation r/t personal issues; she was advised to monitor her heartrate and contact her physician if it does not return to a more normal range (<100). She takes cardizem twice daily. 03/15/18-She is here in follow-up evaluation for left great toe ulcer. She remains noncompliant with glucose control and smoking cessation. She admits to compliance with wearing offloading shoe. The ulcer is improved/stable and we will continue with the same treatment plan and she will follow-up next week 03/22/18-She is here for evaluation for left great toe ulcer. There continues to be significant improvement despite recurrent hyperglycemia (over 500 yesterday) and she continues to smoke. She has been compliant with offloading and we will continue with same treatment plan and she will follow-up next week. 03/29/18-She is here for evaluation  for left great toe ulcer. Despite continuing to smoke and uncontrolled diabetes she continues to improve. She is compliant with offloading shoe. We will continue with the same treatment plan and she will follow-up next week 04/05/18- She is here in follow up evaluation for a left great toe ulcer; she presents with small pustule to left fifth toe (resembles ant bite). She admits to compliance with wearing offloading shoe; continues to smoke or have uncontrolled blood glucose control. There is more callus than usual with evidence of bleeding; she denies known trauma. 04/12/18-She is here for evaluation of left great toe ulcer. Despite noncompliance with glycemic control and smoking she continues to make improvement. She continues to wear offloading shoe. The pustule, that was identified last week, to the left fifth toe is resolved. She will follow-up in 2 weeks 05/03/18-she is seen in follow-up evaluation for a left great toe ulcer. She is compliant with offloading, otherwise noncompliant with glycemic control and smoking. She has plateaued and there is minimal improvement noted. We will transition to Hydrofera Blue, replaced the insert to her surgical shoe and she will follow-up in one week 05/10/18- She is here in follow up evaluation for a left great toe ulcer. It appears stable despite measurement change. We will continue with same treatment plan and follow up next week. 05/24/18-She is seen in follow-up evaluation for a left great toe ulcer. She remains compliant with offloading, has made significant improvement in her diet, decreasing the amount of sugar/soda. She said her recent A1c was 10.9 which is lower than. She did see a diabetic nutritionist/educator yesterday. She  continues to smoke. We will continue with the same treatment plan and she'll follow-up next week. 05/31/18- She is seen in follow-up evaluation for left great toe ulcer. She continues to remain compliant with offloading, continues to  make improvement in her diet, increasing her water and decreasing the amount of sugar/soda. She does continue to smoke with no desire to quit. We will apply Prisma to the depth and Hydrofera Blue over. We have not received insurance authorization for oasis. She will follow up next week. 06/07/18-She is seen in follow-up evaluation for left great toe ulcer. It has stalled according to today's measurements although base appears stable. She says she saw a diabetic educator yesterday; her average blood sugars are less than 300 which is an improvement for her. She continues to smoke and states that's my next step She continues with water over soda. We will order for xray, culture and reinstate ace wrap compression prior to placing apligraf for next week. She is voicing no complaints or concerns. Her dressing will change to iodoflex over the next week in preparation for apligraf. 06/14/18-She is seen in follow-up evaluation for left great toe ulcer. Plain film x-ray performed last week was negative for osteomyelitis. Wound culture obtained last week grew strep B and OSSA; she is initiated on keflex  and cefdinir  today; there is erythema to the toe which could be from ace wrap compression, she has a history of wrapping too tight and has has been encouraged to maintain ace wraps that we place today. We will hold off on application of apligraf today, will apply next week after antibiotic therapy has been initiated. She admits today that she has resumed taking a shower with her foot/toe submerged in water, she has been reminded to keep foot/toe out of the bath water. She will be seen in follow up next week 06/21/18-she is seen in follow-up evaluation for left great toe ulcer. She is tolerating antibiotic therapy with no GI disturbance. The wound is stable. Apligraf was applied today. She has been decreasing her smoking, only had 4 cigarettes yesterday and 1 today. She continues being more compliant in diabetic  diet. She will follow-up next week for evaluation of site, if stable will remove at 2 weeks. 06/28/18- She is here in follow up evalution. Apligraf was placed last week, she states the dressing fell off on Tuesday and she was dressing with hydrofera blue. She is healed and will be discharged from the clinic today. She has been instructed to continue with smoking cessation, continue monitoring glucose levels, offloading for an additional 4 weeks and continue with hydrofera blue for additional two weeks for any possible microscopic opening. Readmission: 08/07/18 on evaluation today patient presents for reevaluation concerning the ulcer of her right great toe. She was previously discharged on 06/28/18 healed. Nonetheless she states that this began to show signs of drainage she subsequently went to her primary care provider. Subsequently an x-ray was performed on 08/01/18 which was negative. The patient was also placed on antibiotics at that time. Fortunately they should have been effective for the infection. Nonetheless she's been experiencing some improvement but still has a lot of drainage coming from the wound itself. 08/14/18 on evaluation today patient's wound actually does show signs of improvement in regard to the erythema at this point. She has completed the antibiotics. With that being said we did discuss the possibility of placing her in a total contact cast as of today although I think that I may want to give this just  a little bit more time to ensure nothing recurrence as far as her infection is concerned. I do not want to put in the cast and risk infection at that time if things are not completely resolved. With that being said she is gonna require some debridement today. 08/21/18 on evaluation today patient actually appears to be doing okay in regard to her toe ulcer. She's been tolerating the dressing changes without complication. With that being said it does appear that she is ready and in  fact I think it's appropriate for us  to go ahead and initiate the total contact cast today. Nonetheless she will require some sharp debridement to prepare the wound for application. Overall I feel like things have been progressing well but we do need to do something to get this to close more readily. 08/24/18 patient seen today for reevaluation after having had the total contact cast applied on Tuesday. She seems to have done very well the wound appears to be doing great and overall I'm pleased with the progress that she's made. There were no abnormal areas of rubbing from the cast on her lower extremity. 08/30/18 on evaluation today patient actually appears to be completely healed in regard to her plantar toe ulcer. She tells me at this point she's been having a lot of issues with the cast. She almost fell a couple of times the state shall the step of her dog a couple times as well. This is been a very frustrating process for her other nonetheless she has completely healed the wound which is excellent news. Overall there does not appear to be the evidence of infection at this time which is great news. 09/11/18 evaluation today patient presents for follow-up concerning her great toe ulcer on the left which has unfortunately reopened since I last saw her which was only a couple of weeks ago. Unfortunately she was not able to get in to get the shoe and potentially the AFO that's gonna be necessary due to her left foot drop. She continues with offloading shoe but this is not enough to prevent her from reopening it appears. When we last had her in the total contact cast she did well from a healing standpoint but unfortunately the wound reopened as soon as she came out of the cast within just a couple of weeks. 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 This is also part of the reason why she falls on a regular basis. I just do not believe that is gonna be safe for us  to reinitiate the total contact cast as last time we had this on she fell 3 times one week which is definitely not normal for her. 09/18/18 upon evaluation today the patient actually appears to be doing about the same in regard to her toe ulcer. She did not contact [employer] as I asked her to even though I had given her the prescription. In fact she actually states that she has no idea where the prescription is. She did apparently call [employer] and they told her that all she needed to do was bring the prescription in order to be able to be seen and work on getting the AFO for her left foot. With all that being said she still does not have an appointment and  I'm not sure were things stand that regard. I will give her a new prescription today in order to contact them to get this set up. 09/25/18 on evaluation today patient actually appears to be doing about the same in regard to her toes ulcer. She does have a small areas which seems to have a lot of callous buildup around the edge of the wound which is going to need sharp debridement today. She still is waiting to be scheduled for evaluation with TERECIA, PLAUT A ([id number]) [id number] Page 14 of 21 [employer] for possibility of an AFO. She states there supposed to call her tomorrow to get this set up. Unfortunately it does appear that her foot specifically the toe area is showing signs of erythema. There does not appear to be any systemic infection which is in these good news. 10/02/18 on evaluation today patient actually appears to be doing about the same in regard to her toe ulcer. This really has not done too well although it's not significantly larger it's also not significantly smaller. She has been tolerating the dressing changes without complication. She actually has  her appointment with [facility] and [place] tomorrow to hopefully be measured for obtaining and AFO splint. I think this would be helpful preventing this from reoccurring. We had contemplated starting the cast this week although to be honest I am reluctant to do that as she's been having nausea, vomiting, and seizure activity over the past three days. She has a history of seizures and have been told is nothing that can be done for these. With that being said I do believe that along with the seizures have the nausea vomiting which upon further questioning doesn't seem to be the normal for her and makes me concerned for the possibility of infection or something else going on. I discussed this with the patient and her mother during the office visit today. I do not feel the wound is effective but maybe something else. The responses this was this just happens to her at times and we don't know why. They did not seem to be interested in going to the hospital to have this checked out further. 10/09/18 on evaluation today patient presents for follow-up concerning her ongoing toe ulcer. She has been tolerating the dressing changes without complication. Fortunately there does not appear to be any evidence of infection which is great news however I do think that the patient would benefit from going ahead for with the total contact cast. She's actually in a wheelchair today she tells me that she will use her walker if we initiate the cast. I was very specific about the fact that if we were gonna do the cast I wanted to make sure that she was using the walker in order to prevent any falls. She tells me she does not have stairs that she has to traverse on a regular basis at her home. She has not had any seizures since last week again that something that happens to her often she tells me she did talk to [employer] and they said that it may take up to three weeks to get the brace approved for her. Hopefully that will not  take that long but nonetheless in the meantime I do think the cast could be of benefit. 10/12/18 on evaluation today patient appears to be doing rather well in regard to her toe ulcer. It's just been a few days and already this is significantly improved both as far as overall appearance and size. Fortunately  there's no sign of infection. She is here for her first obligatory cast change. 10/19/18 Seen today for follow up and management of left great toe ulcer. Wound continues to show improvement. Noted small open area with seroussang drainage with palpation. Denies any increased pain or recent fevers during visit. She will continue calcium  alginate with offloading shoe. Denies any questions or concerns during visit. 10/26/18 on evaluation today patient appears to be doing about the same as when I last saw her in regard to her wound bed. Fortunately there does not appear to be any signs of infection. Unfortunately she continues to have a breakdown in regard to the toe region any time that she is not in the cast. It takes almost no time at all for this to happen. Nonetheless she still has not heard anything from the brace being made by [employer] as to when exactly this will be available to her. Fortunately there is no signs of infection at this time. 10/30/18 on evaluation today patient presents for application of the total contact cast as we just received him this morning. Fortunately we are gonna be able to apply this to her today which is great news. She continues to have no significant pain which is good news. Overall I do feel like things have been improving while she was the cast is when she doesn't have a cast that things get worse. She still has not really heard anything from [employer] regarding her brace. 11/02/18 upon evaluation today patient's wound already appears to be doing significantly better which is good news. Fortunately there does not appear to be any signs of infection also good news. Overall  I do think the total contact cast as before is helping to heal this area unfortunately it's just not gonna likely keep the area closed and healed without her getting her brace at least. Again the foot drop is a significant issue for her. 11/09/18 on evaluation today patient appears to be doing excellent in regard to her toe ulcer which in fact is completely healed. Fortunately we finally got the situation squared away with the paperwork which was needed to proceed with getting her brace approved by Medicaid. I have filled that out unfortunately that information has been sent to the orthopedic office that I worked at 2 1/2 years ago and not tired Current wound care measures. Fortunately she seems to be doing very well at this time. 11/23/18 on evaluation today patient appears to be doing More Poorly Compared to Last Time I Saw Her. At [facility] She Had Completely Healed. Currently she is continuing to have issues with reopening. She states that she just found out that the brace was approved through Medicaid now she just has to go get measured in order to have this fitted for her and then made. Subsequently she does not have an appointment for this yet that is going to complicate things we obviously cannot put her back in the cast if we do not have everything measured because they're not gonna be able to measure her foot while she is in the cast. 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 Obviously this is unfortunate and does have me somewhat worried as well. 11/30/18 on evaluation today patient's toe ulcer actually appears to be doing fairly well. The good news is she will be getting her brace in the shoes next week on Wednesday. Hopefully  we will be able to get this to heal without having to go back in the cast however she may need the cast in order to get the wound completely heal and then go from there.  Fortunately there's no signs of infection at this time. 12/07/18 on evaluation today patient fortunately did receive her brace and she states she could tell this definitely makes her walk better. With that being said she's been having issues with her toe where she noticed yesterday there was a lot of tissue that was loosing off this appears to be much larger than what it was previous. She also states that her leg has been read putting much across the top of her foot just about the ankle although this seems to be receiving somewhat. The total area is still red and appears to be someone infected as best I can tell. She is previously taken Bactrim and that may be a good option for her today as well. We are gonna see what I wound culture shows as well and I think that this is definitely appropriate. With that being said outside of the culture I still need to initiate something in the interim and that's what I'm gonna go ahead and select Bactrim is a good option for her. 12/14/18 on evaluation today patient appears to be doing better in regard to her left great toe ulcer as compared to last week's evaluation. There's still some erythema although this is significantly improved which is excellent news. Overall I do believe that she is making good progress is still gonna take some time before she is where I would like her to be from the standpoint of being able to place her back into the total contact cast. Hopefully we will be where we need to be by next week. 12/21/18 on evaluation today patient actually appears to be doing poorly in regard to her toe ulcer. She's been tolerating the dressing changes without complication. Fortunately there's no signs of systemic infection although she does have a lot of drainage from the toe ulcer and this does seem to be causing some issues at this point. She does have erythema on the distal portion of her toe that appears to be likely cellulitis. 12/28/18 on evaluation today  patient actually appears to be doing a little better in my pinion in regard to her toe ulcer. With that being said she still does have some evidence of infection at this time and for her culture she had both E. coli as well as enterococcus as organisms noted on evaluation. For that reason I think that though the Keflex  likely has treated the E. coli rather well this has really done nothing for the enterococcus. We are going to have to initiate treatment for this specifically. 01/04/19 on evaluation today patient's toe actually appears to be doing better from the standpoint of infection. She currently would like to see about putting the cash back on I think that this is appropriate as long as she takes care of it and keeps it from getting wet. She is gonna have some drainage we can definitely pass this up with Drawtex and alginate to try to prevent as much drainage as possible from causing the problems. With that being said I do want to at least try her with the cast between now and Tuesday. If there any issues we can't continue to use it then I will discontinue the use of the cast at that point. 01/08/19 on evaluation today patient actually appears to  be doing very well as far as her foot ulcer specifically the great toe on the left is concerned. She did have an area of rubbing on the medial aspect of her left ankle which again is from the cast. Fortunately there's no signs of infection at this point in this appears to be a very slight skin breakdown. The patient tells me she felt it rubbing but didn't think it was that bad. Fortunately there is no signs of active infection at this time which is good news. No fevers, chills, nausea, or vomiting noted at this time. 01/15/19 on evaluation today patient actually appears to be doing well in regard to her toe ulcer. Again as previous she seems to do well and she has the cast on which indicates to me that during the time she doesn't have a cast on she's putting  way too much pressure on this region. Obviously I think that's gonna be an issue as with the current national emergency concerning the Covid-19 Virus it has been recommended that we discontinue the use of total contact casting by the chief medical officer of our company, Dr. Marinda. The reasoning is that if a patient becomes sick and cannot come into have the cast removed they could Woon, Kerline A ([id number]) [phone].pdf Page 15 of 21 not just leave this on for an additional two weeks. Obviously the hospitals also do not want to receive patient's who are sick into the emergency department to potentially contaminate the region and spread the Covid-19 Virus among other sick individuals within the hospital system. Therefore at this point we are suspending the use of total contact cast until the current emergency subsides. This was all discussed with the patient today as well. 01/22/19 on evaluation today patient's wound on her left great toe appears to be doing slightly worse than previously noted last week. She tells me that she has been on this quite a bit in fact she tells me she's been awake for 38 straight hours. This is due to the fact that she's having to care for grandparents because nobody else will. She has been taking care of them for five the last seven days since I've seen her they both have dementia his is from a stroke and her grandmother's was progressive. Nonetheless she states even her mom who knows her condition and situation has only help two of those days to take care of them she's been taking care of the rest. Fortunately there does not appear to be any signs of active infection in regard to her toe at this point although obviously it doesn't look as good as it did previous. I think this is directly related to her not taking off the pressure and friction by way of taking things easy. Though I completely understand what's going on. 01/29/19 on evaluation  today patient's tools are actually appears to be showing some signs of improvement today compared to last week's evaluation as far as not necessarily the overall size of the wound but the fact that she has some new skin growth in between the two ends of the wound opening. Overall I feel like she has done well she states that she had a family member give her what sounds to be a CAM walker boot which has been helpful as well. 02/05/19 on evaluation today patient's wound bed actually appears to be doing significantly better in regard to her overall appearance of the size of the wound. With that being said she is still having an  issue with offloading efficiently enough to get this to close. Apparently there is some signs of infection at this point as well unfortunately. Previously she's done well of Augmentin  I really do not see anything that needs to be culture currently but there theme and cellulitis of the foot that I'm seeing I'm gonna go ahead and place her on an antibiotic today to try to help clear this up. 02/12/2019 on evaluation today patient actually appears to be doing poorly in regard to her overall wound status. She tells me she has been using her offloading shoe but actually comes in today wearing her tennis shoe with the AFO brace. Again as I previously discussed with her this is really not sufficient to allow the area to heal appropriately. Nonetheless she continues to be somewhat noncompliant and I do wonder based on what she has told my nurse in the past as to whether or not a good portion of this noncompliance may be recreational drug and alcohol related. She has had a history of heroin overdose and this was fairly recently in the past couple of months that have been seeing her. Nonetheless overall I feel like her wound looks significantly worse today compared to what it was previous. She still has significant erythema despite the Augmentin  I am not sure that this is an appropriate  medication for her infection I am also concerned that the infection may have gone down into her bone. 02/19/19 on evaluation today patient actually appears to be doing about the same in regard to her toe ulcer. Unfortunately she continues to show signs of bone exposure and infection at this point. There does not appear to be any evidence of worsening of the infection but I'm also not really sure that it's getting significantly better. She is on the Augmentin  which should be sufficient for the Staphylococcus aureus infection that she has at this point. With that being said she may need IV antibiotics to more appropriately treat this. We did have a discussion today about hyperbaric option therapy. 02/28/19 on evaluation today patient actually appears to be doing much worse in regard to the wound on her left great toe as compared to even my previous evaluation last week. Unfortunately this seems to be training in a pretty poor direction. Her toe was actually now starting to angle laterally and I can actually see the entire joint area of the proximal portion of the digit where is the distal portion of the digit again is no longer even in contact with the joint line. Unfortunately there's a lot more necrotic tissue around the edge and the toe appears to be showing signs of becoming gangrenous in my pinion. I'm very concerned about were things stand at this point. She did see infectious disease and they are planning to send in a prescription for Sivextro  for her and apparently this has been approved. With that being said I don't think she should avoid taking this but at the same time I'm not sure that it's gonna be sufficient to save her toe at this point. 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 03/05/19 on  evaluation today patient actually appears to be doing quite well in regard to her toe all things considering. She still has bone exposed but there appears to  be much less your thing on overall the appearance of the wound and the toe itself is dramatically improved. She still does have some issues currently obviously with infection she did see vascular as well and there concerned that her blood flow to the toad. For that reason they are setting up for an angiogram next week. 03/14/19 on evaluation today patient appears to be doing very poor in regard to her toe and specifically in regard to the ulceration and the fact that she's starting to notice the toe was leaning even more towards the lateral aspect and the complete joint is visible on the proximal aspect of the joint. Nonetheless she's also noted a significant odor and the tip of the toe is turning more dark and necrotic appearing. Overall I think she is getting worse not better as far as this is concerned. For that reason I am recommending at this point that she likely needs to be seen for likely amputation. READMISSION 03/19/2021 This is a patient that we cared for in this clinic for a prolonged period of time in 2019 and 2020 with a left foot and left first toe wound. 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 Lennie. In December 2021 she had an ulcer on her right great toe as well as the fourth and fifth toes. 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 She also had an angiogram at that time and underwent angioplasty of the right anterior tibial artery. In any case she claims that the wound on the right foot is closed I did not look at this today which was probably an oversight although I think that should be done next week. After her surgery she developed a dehiscence but I do not see any  follow-up of this. According to Dr. Honore last review that she was out of the area being cared for by another physician but recently came back to his attention. 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 A culture of this area showed E. coli apparently before she came back to see Dr. Lennie she was supposed to be receiving antibiotics but she did not really take them. Nor is she offloading this area at all. Finally her last hemoglobin A1c listed in epic was in March 2022 at 14.1 she says things are a lot better since then although I am not sure. 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 She had this wound at the time. During this admission she had E. coli sepsis a TEE was negative. 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 She is still a heavy cigarette smoker. She has a history of bipolar disorder seizure disorder ABI in our clinic was 1.05 6/1; left midfoot in the setting of a TMA done previously. Round circular wound with a knuckle of protruding tissue. The problem is that the knuckle was not attached to any of the surrounding granulation and this probed proximally widely I removed a large portion of this tissue. This wound goes with considerable undermining laterally. I do not feel any bone there was no purulence but this  is a deep wound. 6/8; in spite of the debridement I did last week. She arrives with a wound looking exactly the same. A protruding knuckle of tissue nonadherent to most of the surrounding tissue. There is considerable depth around this from 6-12 o'clock at 2.7 cm and undermining of 1 cm. This does not look overtly infected  and the x- ray I did last week was negative for any osseous abnormalities. We have been using silver collagen 6/15; deep tissue culture I did last week showed moderate staph aureus and moderate Pseudomonas. This will definitely require prolonged antibiotic therapy. The pathology on the protuberant area was negative for malignancy fungus etc. 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 We have been using silver collagen. I am going to be prescribing Levaquin for 2 weeks. Her CT scan of the foot is down for 7/5 6/22; CT scan of the foot on 7 5. She says she has hardware in the left leg from her previous fracture. She is on the Levaquin for the deep tissue culture I did that showed methicillin sensitive staph aureus and Pseudomonas. I gave her a 2-week supply and she will have another week. She arrives in clinic today with the same protuberant tissue however this is nonadherent to the tissue surrounding it. I am really at a loss to explain this unless there is underlying deep tissue infection 6/29; patient presents for 1 week follow-up. She has been using collagen to the wound bed. She reports taking her antibiotics as prescribed.She has no complaints or issues today. She denies signs of infection. 7/6; patient presents for one week followup. She has been using collagen to the wound bed. She states she is taking Levaquin however at times she is not able PATRIZIA, PAULE A ([id number]) [phone].pdf Page 16 of 21 to keep it down. She denies signs of infection. 7/13; patient presents for 1 week follow-up. She has been using silver alginate to the wound bed. She still has nausea when taking her antibiotics. She denies signs of infection. 7/20; patient presents for 1 week follow-up. She has been using silver alginate with gentamicin cream to the wound bed. She denies any issues and has no complaints today. She denies signs of  infection. 7/27; patient presents for 1 week follow-up. She continues to use silver alginate with gentamicin cream to the wound bed. She reports starting her antibiotics. She has no issues or complaints. Overall she reports stability to the wound. 8/3; patient presents for 1 week follow-up. She has been using silver alginate with gentamicin cream to the wound bed. She reports completing all antibiotics. She has no issues or complaints today. She denies signs of infection. 8/17; patient presents for 2-week follow-up. He is to use silver alginate to the wound bed. She has no issues or complaints today. She denies signs of infection. She reports her pain has improved in her foot since last clinic visit 8/24; patient presents for 1 week follow-up. She continues to use silver alginate to the wound bed. She has no issues or complaints. She denies signs of infection. Pain is stable. 9/7; patient presents for follow-up. She missed her last week appointment due to feeling ill. She continues to use silver alginate. She has a new wound to the right lower extremity that is covered in eschar. She states It occurred over the past week and has no idea how it started. She currently denies signs of infection. 9/14; patient presents for follow-up. T the left  foot wound she has been using gentamicin cream and silver alginate. T the right lower extremity wound she has o o been keeping this covered and has not obtain Santyl. 9/21; patient presents for follow-up. She reports using gentamicin cream and silver alginate to the left foot and Santyl to the right lower extremity wound. She has no issues or complaints today. She denies signs of infection. 9/28; patient presents for follow-up. She reports a new wound to her right heel. She states this occurred a few days ago and is progressively gotten worse. She has been trying to clean the area with a Q-tip and Santyl. She reports stability in the other 2 wounds. She has been  using gentamicin cream and silver alginate to the left foot and Santyl to the right lower extremity wound. 10/12; patient presents for follow-up. She reports improvement to the wound beds. She is seeing vein and vascular to discuss the potential of a left BKA. She states they are going to do an arteriogram. She continues to use silver alginate with dressing changes to her wounds. 11/2; patient presents for follow-up. She states she has not been doing dressing changes to the wound beds. She states she is not able to offload the areas. She reports chronic pain to her left foot wound. 11/9; patient presents for follow-up. She came in with only socks on. She states she forgot to put on shoes. It is unclear if she is doing any dressing changes. She currently denies systemic signs of infection. 11/16; patient presents for follow-up. She came again only with socks on. She states she does not wear shoes ever. It is unclear if she does dressing changes. She currently denies systemic signs of infection. 11/23; patient presents for follow-up. She wore her shoes today. It still unclear exactly what dressing she is using for each wound but she did states she obtained Dakin's solution and has been using this to the left foot wound. She currently denies signs of infection. 11/30; patient presents for follow-up. She has no issues or complaints today. She currently denies signs of infection. 12/7; patient presents for follow-up. She has no issues or complaints today. She has been using Hydrofera Blue to the right heel wound and Dakin solution to the left foot wound. Her right anterior leg wound is healed. She currently denies signs of infection. 12/14; patient presents for follow-up. She has been using Hydrofera Blue to the right heel and Dakin's to the left foot wounds. She has no issues or complaints today. She denies signs of infection. 12/21; patient presents for follow-up. She reports using Hydrofera Blue to  the right heel and Dakin's to the left foot wound. She denies signs of infection. 12/28; patient presents for follow-up. She continues to use Dakin's to the left foot wound and Hydrofera Blue to the right heel wound. She denies signs of infection. 1/4; patient presents for follow-up. She has no issues or complaints today. She denies signs of infection. 1/11; patient presents for follow-up. It is unclear if she has been dressing these wounds over the past week. She currently denies signs of infection. 1/18; patient presents for follow-up. She states she has been using Dakin's wet-to-dry dressings to the left foot. She has been using Hydrofera Blue to the right foot foot wound. She states that the anterior right leg wound has reopened and draining serous fluid. She denies signs of infection. 1/25; patient presents for follow-up. She has no issues or complaints today. 2/1; patient presents for follow-up. She has  no issues or complaints today. She denies signs of infection. 2/8; patient presents for follow-up. She has lost her surgical shoes. She did not have a dressing to the right heel wound. She currently denies signs of infection. 2/15; patient presents for follow-up. She reports more pain to the right heel today. She denies purulent drainage Or fever/chills 2/22; patient presents for follow-up. She reports taking clindamycin  over the past week. She states that she continues to have pain to her right heel. She reports purulent drainage. Readmission 03/16/2022 Ms. Leiliana Barbour is a 48 year old female with a past medical history of type 2 diabetes, osteomyelitis to her feet, chronic systolic heart failure and bipolar disorder that presents to the clinic for bilateral feet wounds and right lower extremity wound. She was last seen in our clinic on 12/15/2021. At that time she had purulent drainage coming out of her right plantar foot and I recommended she go to the ED. She states she went to [facility] and has been there for the past 3 months. I cannot see the records. She states she had OR debridement and was on several weeks of IV antibiotics while inpatient. Since discharge she has not been taking care of the wound beds. She had nothing on her feet other than socks today. She currently denies signs of infection. 5/31; patient presents for follow-up. She has been using Dakin's wet-to-dry dressings to the wound beds on her feet bilaterally and antibiotic ointment to the right anterior leg wound. She had a wound culture done at last clinic visit that showed moderate Pseudomonas aeruginosa sensitive to ciprofloxacin. She currently denies systemic signs of infection. 6/14; patient presents for follow-up. She received Keystone 5 days ago and has been using this on the wound beds. She states that last week she had to go to the hospital because she had increased warmth and erythema to the right foot. She was started on 2 oral antibiotics. She states she has been taking these. She currently denies systemic signs of infection. She has no issues or complaints today. 6/21; patient presents for follow-up. She states she has been using Keystone antibiotics to the wound beds. She has no issues or complaints today. She denies signs of infection. 6/28; patient presents for follow-up. She has been using Keystone antibiotics to the wound beds. She has no issues or complaints today. 7/12; patient presents for follow-up. Has been using Keystone antibiotics to the wound beds with calcium  alginate. She has no issues or complaints today. She BENICIA, BERGEVIN A ([id number]) [id number] Page 17 of 21 never followed up with her orthopedic surgeon who did the OR debridement to the right foot. We discussed the total contact cast for the left foot and patient would like to do this next week. 7/19; patient presents for follow-up. She has been using Keystone antibiotics with calcium   alginate to the wound beds. She has no issues or complaints today. Patient is in agreement to do the total contact cast of the left foot today. She knows to return later this week for the obligatory cast change. 05-13-2022 upon evaluation today patient's wound which she has the cast of the left leg actually appears to be doing significantly better. Fortunately I do not see any signs of active infection locally or systemically which is great news and overall I am extremely pleased with where we stand currently. 7/26; patient presents for follow-up. She has a cast in place for the past week. She states it irritated her shin. Other than that  she tolerated the cast well. She states she would like a break for 1 week from the cast. We have been using Keystone antibiotic and Aquacel to both wound beds. She denies signs of infection. 8/2; patient presents for follow-up. She has been using Keystone and Aquacel to the wound beds. She denies any issues and has no complaints. She is agreeable to have the cast placed today for the left leg. 06-03-2022 upon evaluation today patient appears to be doing well with regard to her wound she saw some signs of improvement which is great news. Fortunately I do not see any evidence of active infection locally or systemically at this time which is great news. No fevers, chills, nausea, vomiting, or diarrhea. 8/16; patient presents for follow-up. She has no issues or complaints today. We have been using Keystone and Aquacel to the wound beds. The left lower extremity is in a total contact cast. She is tolerated this well. 8/23; patient presents for follow-up. She has had the total contact cast on the left leg for the past week. Unfortunately this has rubbed and broken down the skin to the medial foot. She currently denies signs of infection. She has been using Keystone antibiotic to the right plantar foot wound. 8/30; patient presents for follow-up. We have held off on the  total contact cast for the left leg for the past week. Her wound on the left foot has improved and the previous surrounding breakdown of skin has epithelialized. She has been using Keystone antibiotic to both wound beds. She has no issues or complaints today. She denies signs of infection. 9/6; patient presents for follow-up. She has ordered her's Keystone antibiotic and this is arriving this week. She has been doing Dakin's wet-to-dry dressings to the wound beds. She denies signs of infection. She is agreeable to the total contact cast today. 9/13; patient presents for follow-up. She states that the cast caused her left leg shin to be sore. She would like to take a break from the cast this week. She has been using Keystone antibiotic to the right plantar foot wound. She denies signs of infection. 9/20; patient presents for follow-up. She has been using Keystone antibiotics to the wound beds with calcium  alginate to the right foot wound and Hydrofera Blue to the left foot wound. She is agreeable to having the cast placed today. She has been approved for Apligraf and we will order this for next clinic visit. 9/27; patient presents for follow-up. We have been using Keystone antibiotic with Hydrofera Blue to the left foot wound under a total contact cast. T the right o foot wound she has been using Keystone antibiotic and calcium  alginate. She declines a total contact cast today. Apligraf is available for placement and she would like to proceed with this. 07-28-2022 upon evaluation today patient appears to be doing well currently in regard to her wound. She is actually showing signs of significant improvement which is great news. Fortunately I do not see any evidence of active infection locally nor systemically at this time. She has been seeing Dr. Rosan and to be honest has been doing very well with the cast. Subsequently she comes in today with a cast on and we did reapply that today as well. She did  not really want to she try to talk me out of that but I explained that if she wanted to heal this is really the right way to go. Patient voiced understanding. In regard to her right foot this is  actually a lot better compared to the last time I saw her which is also great news. 10/11; patient presents for follow-up. Apligraf and the total contact cast was placed to the left leg at last clinic visit. She states that her right foot wound had burning pain to it with the placement of Apligraf to this area. She has been doing [place] over this area. She denies signs of infection including increased warmth, erythema or purulent drainage. 11/1; 3-week follow-up. The patient fortunately did not have a total contact cast or an Apligraf and on the left foot. She has been using Keystone ABD pads and kerlix and her own running shoes She arrives in clinic today with thick callus and a very poor surface on the left foot on the right nonviable skin subcutaneous tissue and a deep probing hole. 11/15; patient missed her last clinic appointment. She states she has not been dressing the wound beds for the past 2 weeks. She states that at she had a new roommate but is now going back to live with her mother. Apparently its been a distracting 2 weeks. Patient currently denies signs of infection. 11/22; patient presents for follow-up. She states she has been using Keystone antibiotic and Dakin's wet-to-dry dressings to the wound beds. She is agreeable for cast placement today. We had ordered Apligraf however this has not been received by our facility. 11/29; Patient had a total contact cast placed at last clinic visit and she tolerated this well. We were using silver alginate under the cast. Patient's been using Keystone antibiotic with Aquacel to the right plantar foot wound. She has no issues or complaints today. Apligraf is available for placement today. Patient would like to proceed with this. 12/6; patient presents  for follow-up. She had Apligraf placed in standard fashion last clinic visit under the total contact cast to the left lower extremity. She has been using Keystone antibiotic and Aquacel to the right plantar foot wound. She has no issues or complaints today. 12/13; patient presents for follow-up. She has finished 5 Apligraf placements. Was told she would not qualify for more. We have been doing a total contact cast to the left lower extremity. She has been using Keystone antibiotic and Aquacel to the right plantar foot wound. She has no issues or complaints today. 12/20; patient presents for follow-up. We have been using Hydrofera Blue with Keystone antibiotic under a total contact cast of the left lower extremity. She reports using Keystone antibiotic and silver alginate to the right heel wound. She has no issues or complaints today. 12/27; patient presents with a healthy wound on the left midfoot. We have Apligraf to apply that to that more also using a total contact cast. On the right we are using Keystone and silver alginate. She is offloading the right heel with a surgical shoealthough by her admission she is on her feet quite a bit 1/3; patient presents for follow-up. Apligraf was placed to the wound beds last clinic visit. She was placed in a total contact cast to the left lower extremity. She declines a total contact cast today. She states that her mother is in the hospital and she cannot adequately get around with the cast on. 1/10; patient presents for follow-up. She declined the total contact cast at last clinic visit. Both wounds have declined in appearance. She states that she has been on her feet and not offloading the wound beds. She currently denies signs of infection. 1/17; patient presents for follow-up. She had the  total contact cast along with Apligraf placed last week to the left lower extremity. She tolerated this well. She has been using Aquacel Ag and Keystone antibiotic to the  right heel wound. She currently denies signs of infection. She has no issues or complaints today. 1/24; patient presents for follow-up. We have been using Apligraf to the left foot wound along with a total contact cast. She has done well with this. T the right o heel wound she has been using Aquacel Ag and Keystone antibiotic ointment. She has no issues or complaints today. She denies signs of infection. KATTLEYA, KUHNERT A ([id number]) [id number] Page 18 of 21 1/31; patient presents for follow-up. We have been using Apligraf to the left foot wound along with the total contact cast. She continues to do well with this. To the right heel we have been using Aquacel Ag and Keystone antibiotic ointment. She has no issues or complaints today. 12-01-2022 upon evaluation patient is seen today on my schedule due to the fact that she unfortunately was in the hospital yesterday. Her cast needed to come off the only reason she is out of the hospital is due to the fact that they would not take it off at the hospital which is somewhat bewildered reading to me to be perfectly honest. I am not certain why this was but either way she was released and then was placed on my schedule today in order to get this off and reapply the total contact casting as appropriate. I do not have an Apligraf for her today it was applied last week and today's actually expired yesterday as there was some scheduling conflicts with her being in the hospital. Nonetheless we do not have that for reapplication today but the good news that she is not draining too much and the Apligraf can go for up to 2 weeks so I am going to go ahead and reapply the total contact casting but we are going to leave the Apligraf in place. 2/14; patient presents for follow-up. T the left leg she has had the total contact cast and Apligraf for the past week. She has had no issues with this. T the o o right heel she has been using antibiotic  ointment and Aquacel Ag. 2/21; patient presents for follow-up. We have been using Apligraf and a total contact cast to the left lower extremity. She is tolerated this well. Unfortunately she is not approved for any more Apligraf per insurance. She has been using antibiotic ointment and Aquacel Ag to the right foot. She has no issues or complaints today. 2/28; patient presents for follow-up. We have been using Hydrofera Blue and antibiotic ointment under the total contact cast to the left lower extremity. He has been using Aquacel Ag with antibiotic ointment to the right plantar foot. She has no issues or complaints today. 3/6; patient presents for follow-up. She did not obtain her gentamicin ointment. She has been using Aquacel Ag to the right plantar foot wound. We have been using Hydrofera Blue with antibiotic ointment under the total contact cast to the left lower extremity. She has no issues or complaints today. 3/12; patient presents for follow-up. She has been using gentamicin ointment and Aquacel Ag to the right plantar foot wound. We have been using Hydrofera Blue with antibiotic ointment under the total contact cast on the left lower extremity foot wound. She has no issues or complaints today. 3/20; patient presents for follow-up. She has been using gentamicin ointment and Aquacel Ag to  the right plantar foot wound. We have been using Hydrofera Blue with antibiotic ointment under the total contact cast to the left lower extremity wound. She has no issues or complaints today. 3/27; patient presents for follow-up. We have been using antibiotic ointment and Aquacel Ag to the right plantar foot wound. We have been using Hydrofera Blue with antibiotic ointment under the total contact cast to the left lower extremity. Both wounds are smaller. 01-24-2023 upon evaluation today patient actually appears to be doing better compared to last time I saw her. It has been several weeks since I last saw her  but nonetheless I think the total contact casting is doing a good job. Fortunately I do not see any evidence of infection or worsening overall which is great news and in general I do believe that she is moving in the appropriate direction. It has been a very long road but nonetheless I feel like she is nearing the end at least in regard to the left foot and even the right foot looks much better to be perfectly honest. 01-31-2023 upon evaluation today patient appears to be doing well currently in regard to her wounds. She has been require some sharp debridement which I think is probably bone both sides to remove a lot of callus that has built up at this point. I discussed that with her today. Also think total contact cast is doing well for the left foot Emina continue that as well. 4/17; patient presents for follow-up. We have been doing Hydrofera Blue with antibiotic ointment under the total contact cast to the left lower extremity and Aquacel Ag with antibiotic ointment to the right foot wound. She has been using her offloading heel shoe here. 4/24; patient presents for follow-up. We have been doing Hydrofera Blue with antibiotic ointment under the total contact cast to the left lower extremity and Aquacel Ag with antibiotic ointment to the right foot wound. She has been using her offloading heel shoe here. Wounds are smaller with slough accumulation. 5/1; patient presents for follow-up. We have been doing Hydrofera Blue with antibiotic ointment under the total contact cast to the left lower extremity and Aquacel Ag with antibiotic ointment to the right foot wound. Left foot wound is stable and right foot wound appears smaller. 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 On the left she is using Hydrofera Blue topical antibiotic under a total contact cast, on the right plantar heel gentamicin Aquacel Ag. According to our intake nurse both are making slow but steady improvements. 5/15; patient presents  for follow-up. We have been doing Hydrofera Blue with antibiotic ointment under the total contact cast on the left and antibiotic ointment with Aquacel Ag on the right. Unfortunately the left foot wound has declined in size and appearance. We also do not have the total contact cast available in office today. Patient denies signs of infection. 5/22; patient presents for follow-up. We have been doing Hydrofera Blue and antibiotic ointment to the left foot and Aquacel Ag with antibiotic ointment to the right foot. We took a break from the cast since we did not have it placed at last week. Wounds look stable if not slightly improved. 03-27-2023 patient presents today she has had this For Most 2 Weeks Because She Was in the Hospital and They Did Not T It off. Again That Is Really Not ake Something That She Could Control but Nonetheless We Are Seeing Her at This Point Today for Reevaluation. We Do Want to Go and  See about Reapply the Cast. Again We Had All Set Everything up to Go Ahead and Do This When We Went to Get the Cast Only to Realize That Not Had Come in and We Were out at the Moment. With That Being [place] We Will Get Have T Get the Exam before We Can Reapply the T Contact Casting Going Forward. o otal 6/12; patient presents for follow-up. She has been using Hydrofera Blue to the left foot wound and Aquacel Ag to the right foot wound. No cast was available last week placed on the patient so she has been offloading with her surgical shoe. She has no issues or complaints today. 6/19; patient presents for follow-up. We have been using Hydrofera Blue to the left foot wound under the total contact cast and Aquacel Ag to the right wound. She has no issues or complaints today. The wounds are smaller. 6/26; patient presents for follow-up. We have been using Hydrofera Blue to the left foot wound under the total contact cast and Aquacel Ag to the right foot wound. Wounds are smaller. Patient has no issues or  complaints today. 7/3; patient presents for follow-up. We have been using Hydrofera Blue to the left foot wound under the total contact cast and Aquacel Ag to the right foot wound. Wounds Appear smaller. No signs of infection. 7/10; patient presents for follow-up. We have been using Hydrofera Blue to the left foot wound under the total contact cast and Aquacel Ag to the right foot wound. Left foot wound is smaller. Right foot wound is stable. 7/17; patient presents for follow-up. We have been using Hydrofera Blue to the left foot under the total contact cast and Aquacel Ag to the right foot wound. She has no issues or complaints today. 7/24; patient presents for follow-up. We have been using Hydrofera Blue to the left foot under the total contact cast and Aquacel Ag to the right foot wound. Unfortunately she was bit by her dog over the weekend and developed a wound to the right lower leg. She has been keeping the area covered. 7/31; patient presents for follow-up. We have been using Hydrofera Blue to the left foot under the total contact cast and Aquacel Ag to the right foot wound and mupirocin  ointment to the right anterior leg wound. She started her oral antibiotics 2 days ago. 8/7; patient presents for follow-up. We have been using Hydrofera Blue and antibiotic ointment to the left foot under the total contact cast and Aquacel Ag to the right foot wound. Antibiotic ointment to the right anterior leg wound. She completed her oral antibiotics. She has no issues or complaints today. Wounds continue to improve albeit slowly. MARGAUX, ENGEN A ([id number]) [id number] Page 19 of 21 8/14; 8/14; patient has 3 wounds 1 on the left plantar heel 1 on the right plantar heel and a small wound on the right anterior lower leg. We have been using Hydrofera Blue in the left heel Aquacel on the right heel. The left heel has been in a total contact cast 8/21; patient presents for  follow-up. We have been using Hydrofera Blue to the left foot wound under the total contact cast. T the right heel patient has been o using Aquacel Ag. T the anterior leg wound antibiotic ointment. All wounds have healed. o 8/28; patient presents for follow-up. She has been using her surgical shoe with offloading felt pads. Her wounds have remained closed. 9/11; patient presents for final follow-up to assure that her wounds have remained  closed. Unfortunately the left foot wound has opened again. But she has been wearing her Surgical shoe for offloading with felt pad. 07-14-2023 upon evaluation today patient appears to be doing well currently in regard to her wound. She has been tolerating the dressing changes without complication with total contact casting. I am going to be helping take care of her at this point as she is transferring from Dr. Rosan to me since Dr. Rosan is on maternity leave. 07-24-2023 upon evaluation today patient appears to be doing decently well currently in regard to her wound. She is actually showing signs of improvement in regard to the left foot and I feel like this is much better than last week no debridement even necessary today. We may have to perform some slight debridement come next week we will see how things go. Monitor for any signs of worsening overall and obviously if anything changes she should be letting us  know. 08-01-2023 upon evaluation today patient appears to be doing excellent in regard to her feet. The right foot/heel area is completely healed she does have some callus buildup I am going to trim this down a little bit I do not want to build up to the point of cracking she is doing well with the urea  cream. With regard to the left heel she is doing well with casting I am going to perform some debridement here to clear away before reapplying the cast and I think she is really doing quite well though. 08-07-2023 upon evaluation patient's wound actually  showed signs of excellent improvement and actually very pleased with where we stand I do believe that she is making good headway towards complete closure which is great news. 08-14-2023 upon evaluation today patient appears to be doing well currently in regard to her wound. She actually is going require a little bit of debridement but fortunately nothing too significant today actually feel like she is doing much better. This is mainly to remove callus on the little slough and biofilm down to good subcutaneous tissue. In general I think that she is making excellent headway here. 08-21-2023 upon evaluation today patient appears to be doing well currently in regard to her wound which is actually showing signs of significant improvement. I am extremely pleased with where things stand I do not see any signs of infection at this time which is great news and overall I do think that we are making headway towards complete closure which is also excellent news. 08-30-2023 upon evaluation today patient's wound is showing signs of continued improvement. Fortunately I do not see any evidence of worsening overall and I believe that the patient is making excellent headway towards closure. I am extremely pleased with where we stand. 09-04-2023 upon evaluation today patient appears to be doing well currently in regard to her foot ulcer. She has been tolerating the dressing changes without complication. Fortunately I do not see any signs of active infection locally or systemically at this time which is great news. No fevers, chills, nausea, vomiting, or diarrhea. 09-11-2023 upon evaluation today patient appears to be doing well currently in regard to her wound on the left foot. I feel like she is actually showing signs of improvement I feel like the collagen is doing a good job. With that being said unfortunately she has an area where there is a spot on the right foot that has not really open but at least there looks  like there may have been something that popped off of this  area. 09-18-2023 upon evaluation today patient appears to be doing well currently in regard to her wound although to be honest she still continues to have issues here with the wound not healing as rapidly as I would expect. She does a lot of walking on the cast from what I am hearing today and to be honest as I mention to her previously this cast is not exactly appropriate for her based on the size of her leg is technically too small and I think this is causing issues with rubbing and therefore callus buildup that would not otherwise occur with a properly fitting cast. I discussed that with her today and I think that potentially having a offloading shoe would be better specifically a front offloading boot. 09-25-2023 upon evaluation patient's wound is showing signs of improvement which is great news and in general I do feel like that she is really doing quite well. I do not see any signs of active infection at this time which is excellent and in general I think that we are moving in the right direction here. She did get the offloading boot off of [employer] that working to get a try it is a front off loader which I think should do quite well and we will get a see how that does over the next week. 10-02-2023 upon evaluation today patient's wound actually showing signs of good improvement. I actually feel like it is overall looking better there is still quite a bit of callus but I did go ahead and perform debridement today to clear this away. She in general seems to be doing quite well. 10-09-2023 upon evaluation today patient unfortunately appears to be doing worse in regard to her wound she is showing signs of infection and this is not good. Fortunately I do not see any evidence of deeper wound but at the same time I do feel like there is some area underneath the callus where this has spread again we have previously gotten that healed. I think that  she is probably going to need some oral antibiotic therapy. 10-17-2023 upon evaluation today patient unfortunately appears to be doing better with regard to the infection but has a lot more callus buildup. I really do not think that the boot is working out well for her. 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 Either way I do not believe at this point that this is going to be the appropriate way to continue currently. 12/31; patient seen in follow-up. 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 2 weeks ago she was felt to have an infection. A swab culture for PCR showed significant copies of Pseudomonas, group A strep, anaerobic strep and Enterococcus faecalis. She was given a 2-week course of Cipro which she has now finished. She was in a total contact cast and doing well but she has not had the cast on and almost a month now. We have been using Hydrofera Blue on the wound Objective Constitutional Patient is hypotensive.But does not appear acutely unwell. Pulse regular and within target range for patient.SABRA Respirations regular, non-labored and within target range.. Temperature is normal and within the target range for the patient.. Vitals Time Taken: 3:48 PM, Height: 69 in, Weight: 178 lbs, BMI: 26.3, Temperature: 98.8 F, Pulse: 74 bpm, Respiratory Rate: 16 breaths/min, Blood Pressure: 84/62 mmHg. MIRTHA, JAIN A ([id number]) [id number] Page 20 of  21 General Notes: Wound exam; left plantar foot wound midfoot previous TMA. The wound itself does not have anything ominous. There is nothing that probes to bone the granulation really does not look too bad. She has copious amounts of thick callus around this. She is tender around the wound area when palpating through the callus but there is no overt infection. I used a #5 curette to remove as much of this callus as I could but I  count encountered no purulence no fluid pockets etc. Integumentary (Hair, Skin) Wound #12R status is Open. Original cause of wound was Pressure Injury. The date acquired was: 03/16/2020. The wound has been in treatment 83 weeks. The wound is located on the Left,Medial,Plantar Foot. The wound measures 1.4cm length x 0.9cm width x 0.3cm depth; 0.99cm^2 area and 0.297cm^3 volume. There is Fat Layer (Subcutaneous Tissue) exposed. There is a medium amount of serosanguineous drainage noted. The wound margin is flat and intact. There is no granulation within the wound bed. There is no necrotic tissue within the wound bed. 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 There was a Selective/Open Wound Skin/Epidermis Debridement with a total area of 1.13 sq cm performed by RUFUS OZELL MATSU, MD. With the following instrument(s): Curette to remove Viable and Non-Viable tissue/material. Material removed includes Callus, Skin: Dermis, and Skin: Epidermis. A time out was conducted at 16:24, prior to the start of the procedure. A Moderate amount of bleeding was controlled with Pressure. The procedure was tolerated well. Post Debridement Measurements: 1.6cm length x 0.9cm width x 0.3cm depth; 0.339cm^3 volume. Character of Wound/Ulcer Post Debridement is stable. Severity of Tissue Post Debridement is: Fat layer exposed. Post procedure Diagnosis Wound #12R: Same as Pre-Procedure Plan Follow-up Appointments: Return Appointment in 1  week. Bathing/ Shower/ Hygiene: May shower; gently cleanse wound with antibacterial soap, rinse and pat dry prior to dressing wounds No tub bath. Off-Loading: Open toe surgical shoe - right foot Other: - front offloading boot left foot Medications-Please add to medication list.: P.O. Antibiotics - restart Augmentin  WOUND #12R: - Foot Wound Laterality: Plantar, Left, Medial Cleanser: Vashe 5.8 (oz) Every Other Day/30 Days Discharge Instructions: Use vashe 5.8 (oz) as directed Topical: Mupirocin  Ointment Every Other Day/30 Days Discharge Instructions: Apply as directed by provider. Prim Dressing: Hydrofera Blue Ready Transfer Foam, 2.5x2.5 (in/in) Every Other Day/30 Days ary Discharge Instructions: Apply Hydrofera Blue Ready to wound bed as directed Secondary Dressing: Gauze Every Other Day/30 Days Discharge Instructions: As directed: dry, moistened with saline or moistened with Dakins Solution Secured With: Medipore T - 68M Medipore H Soft Cloth Surgical T ape ape, 2x2 (in/yd) Every Other Day/30 Days Secured With: [employer] Sterile or Non-Sterile 6-ply 4.5x4 (yd/yd) Every Other Day/30 Days Discharge Instructions: Apply Kerlix as directed Secured With: [employer] Food Dressing, Latex-free, Size 5, Small-Head / Shoulder / Thigh Every Other Day/30 Days 1. Callus debridement as noted. 2. Looking at the organisms that were identified on her original PCR the Enterococcus and Peptostreptococcus are potential pathogens in a diabetic foot that would not be covered well by quinolones [Cipro]. I am  therefore going to give her 7 days of Augmentin  3. In terms of the pain she has to see has around the wound I wonder if this is from pressure on the callus. I could not convince myself there is active infection in this area ILENA, DIECKMAN A ([id number]) [id number] Page 21 of 21 Electronic Signature(s) Signed: 10/24/2023 4:49:51 PM By: Rufus Sharper MD Entered By: Rufus Sharper on 10/24/2023 16:45:54 -------------------------------------------------------------------------------- SuperBill Details Patient Name: Date of Service: ELSIE [name] QUAKER RONAL 10/24/2023 Medical Record Number: [id number] Patient Account Number: [id number] Date of Birth/Sex: Treating RN: Mar 24, 1976 (47 y.o. [name] Claudene Blossom Primary Care Provider: [name] Other Clinician: [name] Referring Provider: Treating Provider/Extender: RO BSO N, MICHA EL KANDICE Rosan, [name] Weeks in Treatment: 83 Diagnosis Coding ICD-10 Codes Code Description E11.621 Type 2 diabetes mellitus with foot ulcer L97.528 Non-pressure chronic ulcer of other part of left foot with other specified severity E11.42 Type 2 diabetes mellitus with diabetic polyneuropathy Z79.4 Long term (current) use of insulin  N18.6 End stage renal disease Z99.2 Dependence on renal dialysis L84 Corns and callosities Facility Procedures : CPT4 Code: [id number] Description: 97597 - DEBRIDE WOUND 1ST 20 SQ CM OR < ICD-10 Diagnosis Description L97.528 Non-pressure chronic ulcer of other part of left foot with other specified severi Modifier: ty Quantity: 1 Physician Procedures : CPT4 Code Description Modifier [id number] - WC PHYS DEBR WO ANESTH 20 SQ CM ICD-10 Diagnosis Description L97.528 Non-pressure chronic ulcer of other part of left foot with other specified severity Quantity: 1 Electronic Signature(s) Signed: 10/24/2023 4:49:51 PM By: Rufus Sharper MD Entered By: Rufus Sharper on 10/24/2023 16:46:11

## 2023-10-30 ENCOUNTER — Ambulatory Visit: Payer: MEDICAID | Admitting: Physician Assistant

## 2023-11-06 ENCOUNTER — Encounter: Payer: MEDICAID | Attending: Physician Assistant | Admitting: Physician Assistant

## 2023-11-06 DIAGNOSIS — E1122 Type 2 diabetes mellitus with diabetic chronic kidney disease: Secondary | ICD-10-CM | POA: Insufficient documentation

## 2023-11-06 DIAGNOSIS — L84 Corns and callosities: Secondary | ICD-10-CM | POA: Diagnosis not present

## 2023-11-06 DIAGNOSIS — L97528 Non-pressure chronic ulcer of other part of left foot with other specified severity: Secondary | ICD-10-CM | POA: Diagnosis not present

## 2023-11-06 DIAGNOSIS — Z79899 Other long term (current) drug therapy: Secondary | ICD-10-CM | POA: Insufficient documentation

## 2023-11-06 DIAGNOSIS — E1142 Type 2 diabetes mellitus with diabetic polyneuropathy: Secondary | ICD-10-CM | POA: Insufficient documentation

## 2023-11-06 DIAGNOSIS — E785 Hyperlipidemia, unspecified: Secondary | ICD-10-CM | POA: Insufficient documentation

## 2023-11-06 DIAGNOSIS — Z794 Long term (current) use of insulin: Secondary | ICD-10-CM | POA: Diagnosis not present

## 2023-11-06 DIAGNOSIS — Z992 Dependence on renal dialysis: Secondary | ICD-10-CM | POA: Insufficient documentation

## 2023-11-06 DIAGNOSIS — G40909 Epilepsy, unspecified, not intractable, without status epilepticus: Secondary | ICD-10-CM | POA: Diagnosis not present

## 2023-11-06 DIAGNOSIS — E11621 Type 2 diabetes mellitus with foot ulcer: Secondary | ICD-10-CM | POA: Diagnosis present

## 2023-11-06 DIAGNOSIS — I5022 Chronic systolic (congestive) heart failure: Secondary | ICD-10-CM | POA: Diagnosis not present

## 2023-11-06 DIAGNOSIS — F319 Bipolar disorder, unspecified: Secondary | ICD-10-CM | POA: Insufficient documentation

## 2023-11-06 DIAGNOSIS — L97519 Non-pressure chronic ulcer of other part of right foot with unspecified severity: Secondary | ICD-10-CM | POA: Insufficient documentation

## 2023-11-06 DIAGNOSIS — J449 Chronic obstructive pulmonary disease, unspecified: Secondary | ICD-10-CM | POA: Diagnosis not present

## 2023-11-06 DIAGNOSIS — F17210 Nicotine dependence, cigarettes, uncomplicated: Secondary | ICD-10-CM | POA: Diagnosis not present

## 2023-11-06 DIAGNOSIS — M21372 Foot drop, left foot: Secondary | ICD-10-CM | POA: Diagnosis not present

## 2023-11-06 DIAGNOSIS — N186 End stage renal disease: Secondary | ICD-10-CM | POA: Diagnosis not present

## 2023-11-06 DIAGNOSIS — I132 Hypertensive heart and chronic kidney disease with heart failure and with stage 5 chronic kidney disease, or end stage renal disease: Secondary | ICD-10-CM | POA: Insufficient documentation

## 2023-11-08 NOTE — Progress Notes (Signed)
 Heather Heather, Heather Heather ([id number]) [id number] Page 1 of 8 Visit Report for 11/06/2023 Arrival Information Details Patient Name: Date of Service: Heather Heather, Heather Heather 11/06/2023 9:00 Heather Heather: [id number] Patient Account [name]: [id number] Date of Birth/Sex: Treating RN: 10-06-1976 (48 y.o. Heather Heather Primary Care [name]: [name] Other Clinician: [name] Referring Bliss Behnke: Treating Panayiotis Rain[place]/Extender: Bethena Andre [name] Devra in Treatment: 12 Visit Information History Since Last Visit All ordered tests and consults were completed: No Patient Arrived: Wheel Chair Added or deleted any medications: No Arrival Time: 09:07 Any new allergies or adverse reactions: No Transfer Assistance: EasyPivot Patient Lift Had Heather fall or experienced change in No Patient Identification Verified: Yes activities of daily living that may affect Secondary Verification Process Completed: Yes risk of falls: Patient Requires Transmission-Based Precautions: No Signs or symptoms of abuse/neglect since last visito No Patient Has Alerts: No Hospitalized since last visit: No Implantable device outside of the clinic excluding No cellular tissue based products placed in the center since last visit: Has Dressing in Place as Prescribed: Yes Has Footwear/Offloading in Place as Prescribed: Yes Left: Other:front offloading boot Pain Present Now: No Electronic Signature(s) Signed: 11/06/2023 4:48:09 PM By: [name] Entered By: [name] on 11/06/2023 09:08:11 -------------------------------------------------------------------------------- Clinic Level of Care Assessment Details Patient Name: Date of Service: Heather Heather, Heather Heather 11/06/2023 9:00 Heather Heather: [id number] Patient Account [name]: [id number] Date of Birth/Sex: Treating RN: 10/30/1975 (48 y.o. Heather Heather Primary Care Loreal Schuessler: [name] Other Clinician: [name] Referring Tymesha Ditmore: Treating Shaquasia Caponigro/Extender: Bethena Andre [name] Devra in Treatment: 8 Clinic Level of Care Assessment Items TOOL 1 Quantity Score []  - 0 Use when EandM and Procedure is performed on INITIAL visit ASSESSMENTS - Nursing Assessment / Reassessment []  - 0 General Physical Exam (combine w/ comprehensive assessment (listed just below) when performed on new pt. evalsCAPRICIA, SERDA Heather ([id number]) [id number] Page 2 of 8 []  - 0 Comprehensive Assessment (HX, ROS, Risk Assessments, Wounds Hx, etc.) ASSESSMENTS - Wound and Skin Assessment / Reassessment []  - 0 Dermatologic / Skin Assessment (not related to wound area) ASSESSMENTS - Ostomy and/or Continence Assessment and Care []  - 0 Incontinence Assessment and Management []  - 0 Ostomy Care Assessment and Management (repouching, etc.) PROCESS - Coordination of Care []  - 0 Simple Patient / Family Education for ongoing care []  - 0 Complex (extensive) Patient / Family Education for ongoing care []  - 0 Staff obtains Chiropractor, Records, T Results / Process Orders est []  - 0 Staff telephones HHA, Nursing Homes / Clarify orders / etc []  - 0 Routine Transfer to another Facility (non-emergent condition) []  - 0 Routine Hospital Admission (non-emergent condition) []  - 0 New Admissions / Manufacturing Engineer / Ordering NPWT Apligraf, etc. , []  - 0 Emergency Hospital Admission (emergent condition) PROCESS - Special Needs []  - 0 Pediatric / Minor Patient Management []  - 0 Isolation Patient Management []  - 0 Hearing / Language / Visual special needs []  - 0 Assessment of Community assistance (transportation, D/C planning, etc.) []  - 0 Additional assistance / Altered mentation []  - 0 Support Surface(s) Assessment (bed, cushion, seat, etc.) INTERVENTIONS - Miscellaneous []  - 0 External ear exam []  - 0 Patient Transfer (multiple staff / Nurse, Adult / Similar devices) []  -  0 Simple Staple / Suture removal (25 or less) []  - 0 Complex Staple / Suture removal (26 or more) []  - 0 Hypo/Hyperglycemic Management (do not check if billed separately) []  - 0  Ankle / Brachial Index (ABI) - do not check if billed separately Has the patient been seen at the hospital within the last three years: Yes Total Score: 0 Level Of Care: ____ Electronic Signature(s) Signed: 11/06/2023 4:48:09 PM By: [name] Entered By: [name] on 11/06/2023 09:58:14 -------------------------------------------------------------------------------- Encounter Discharge Information Details Patient Name: Date of Service: Heather Heather, Heather Heather. 11/06/2023 9:00 Heather Heather: [id number] Patient Account [name]: [id number] Date of Birth/Sex: Treating RN: Nov 21, 1975 (48 y.o. Heather Heather, Heather Heather, Heather Heather ([id number]) [id number] Page 3 of 8 Primary Care Valeriano Bain: [name] Other Clinician: [name] Referring Sterling Mondo: Treating Brittish Bolinger/Extender: Bethena Andre [name] Devra in Treatment: 71 Encounter Discharge Information Items Post Procedure Vitals Discharge Condition: Stable Temperature (F): 99.2 Ambulatory Status: Wheelchair Pulse (bpm): 60 Discharge Destination: Home Respiratory Rate (breaths/min): 18 Transportation: Private Auto Blood Pressure (mmHg): 104/66 Accompanied By: mother Schedule Follow-up Appointment: Yes Clinical Summary of Care: Electronic Signature(s) Signed: 11/06/2023 4:48:09 PM By: [name] Entered By: [name] on 11/06/2023 10:35:21 -------------------------------------------------------------------------------- Lower Extremity Assessment Details Patient Name: Date of Service: Heather Heather, Heather Heather 11/06/2023 9:00 Heather Heather: [id number] Patient Account [name]: [id number] Date of Birth/Sex: Treating RN: 10-08-1976 (48 y.o. Heather Heather Primary Care Alonte Wulff: [name] Other  Clinician: [name] Referring Fatuma Dowers: Treating Tally Mattox/Extender: Bethena Andre [name] Weeks in Treatment: 53 Electronic Signature(s) Signed: 11/06/2023 4:05:20 PM By: Alverta Elenor RN Signed: 11/06/2023 4:48:09 PM By: [name] Entered By: [name] on 11/06/2023 09:17:26 -------------------------------------------------------------------------------- Multi Wound Chart Details Patient Name: Date of Service: Heather Heather, Heather Heather. 11/06/2023 9:00 Heather Heather: [id number] Patient Account [name]: [id number] Date of Birth/Sex: Treating RN: [birth year] (47 y.o. Heather Heather Primary Care Livvy Spilman: [name] Other Clinician: [name] Referring Ashiyah Pavlak: Treating Makena Mcgrady/Extender: Bethena Andre [name] Devra in Treatment: 47 Vital Signs Height(in): 69 Pulse(bpm): 60 Weight(lbs): 178 Blood Pressure(mmHg): 104/66 Body Mass Index(BMI): 26.3 Temperature(F): 99.2 Respiratory Rate(breaths/min): 18 Hopkin, Hibo Heather ([id number]) [id number] Page 4 of 8 [12R:Photos: No Photos Left, Medial, Plantar Foot Wound Location: Pressure Injury Wounding Event: Diabetic Wound/Ulcer of the Lower Primary Etiology: Extremity Chronic sinus problems/congestion, N/Heather Comorbid History: Middle ear problems, Anemia, Chronic Obstructive  Pulmonary Disease (COPD), Congestive Heart Failure, Type II Diabetes, End Stage Renal Disease, History of pressure wounds, Neuropathy 03/16/2020 Date Acquired: 45 Weeks of Treatment: Open Wound Status: Yes Wound Recurrence: 1.5x1x0.4 Measurements L x W x  D (cm) 1.178 Heather (cm) : rea 0.471 Volume (cm) : 75.00% % Reduction in Heather rea: 50.00% % Reduction in Volume: Grade 3 Classification: Medium Exudate Heather mount: Serosanguineous Exudate Type: red, brown Exudate Color: Flat and Intact Wound Margin: Medium (34-66%)  Granulation Heather mount: Pink Granulation Quality: None Present (0%) Necrotic Heather mount: Fat Layer (Subcutaneous  Tissue): Yes N/Heather Exposed Structures: Fascia: No Tendon: No Muscle: No Joint: No Bone: No Medium (34-66%) Epithelialization:] [N/Heather:N/Heather N/Heather N/Heather N/Heather  N/Heather N/Heather N/Heather N/Heather N/Heather N/Heather N/Heather N/Heather N/Heather N/Heather N/Heather N/Heather N/Heather N/Heather N/Heather N/Heather N/Heather N/Heather] Treatment Notes Electronic Signature(s) Signed: 11/06/2023 4:48:09 PM By: [name] Entered By: [name] on 11/06/2023 09:17:31 -------------------------------------------------------------------------------- Multi-Disciplinary Care Plan Details Patient Name: Date of Service: Heather Heather Heather Heather. 11/06/2023 9:00 Heather Heather: [id number] Patient Account [name]: [id number] Date of Birth/Sex: Treating RN: [birth year] (48 y.o. Heather Heather Primary Care Caitlynne Harbeck: [name] Other Clinician: [name] Referring Jenne Sellinger: Treating Caisley Baxend[place]/Extender: Bethena Andre [name] Devra in Treatment: 69 Active Inactive Abuse / Safety /  Falls / Self Care Management Nursing Diagnoses: History of Falls Potential for falls Heather Heather, REDDITT Heather ([id number]) [id number] Page 5 of 8 Potential for injury related to falls Goals: Patient will not develop complications from immobility Date Initiated: 03/17/2022 Date Inactivated: 04/20/2022 Target Resolution Date: 03/16/2022 Goal Status: Met Patient/caregiver will verbalize understanding of skin care regimen Date Initiated: 03/17/2022 Date Inactivated: 09/28/2022 Target Resolution Date: 03/16/2022 Goal Status: Met Patient/caregiver will verbalize/demonstrate measure taken to improve self care Date Initiated: 03/17/2022 Target Resolution Date: 06/21/2023 Goal Status: Active Interventions: Assess fall risk on admission and as needed Provide education on basic hygiene Provide education on personal and home safety Notes: Electronic Signature(s) Signed: 11/06/2023 4:05:20 PM By: Alverta Sailors RN Signed: 11/06/2023 4:48:09 PM By: [name] Entered By: [name] on 11/06/2023  09:58:28 -------------------------------------------------------------------------------- Pain Assessment Details Patient Name: Date of Service: Heather [name] QUAKER Heather. 11/06/2023 9:00 Heather Heather: [id number] Patient Account [name]: [id number] Date of Birth/Sex: Treating RN: September 06, 1976 (47 y.o. Heather Heather Primary Care Tiara Maultsby: [name] Other Clinician: [name] Referring Charidy Cappelletti: Treating Akia Desroches/Extender: Bethena Andre [name] Devra in Treatment: 82 Active Problems Location of Pain Severity and Description of Pain Patient Has Paino No Site Locations Pain Management and Medication Current Pain Management: SOFIAH, LYNE Heather ([id number]) [id number] Page 6 of 8 Electronic Signature(s) Signed: 11/06/2023 4:05:20 PM By: Alverta Sailors RN Signed: 11/06/2023 4:48:09 PM By: [name] Entered By: [name] on 11/06/2023 09:12:36 -------------------------------------------------------------------------------- Patient/Caregiver Education Details Patient Name: Date of Service: Heather Heather 1/13/2025andnbsp9:00 Heather Heather: [id number] Patient Account [name]: [id number] Date of Birth/Gender: Treating RN: Jan 01, 1976 (48 y.o. Heather Heather Primary Care Physician: [name] Other Clinician: [name] Referring Physician: Treating Physician/Extender: Bethena Andre [name] Devra in Treatment: 58 Education Assessment Education Provided To: Patient Education Topics Provided Wound/Skin Impairment: Handouts: Other: continue wound care as directed Methods: Explain/Verbal Responses: State content correctly Electronic Signature(s) Signed: 11/06/2023 4:48:09 PM By: [name] Entered By: [name] on 11/06/2023 10:33:53 -------------------------------------------------------------------------------- Wound Assessment Details Patient Name: Date of Service: Heather [name] QUAKER Heather. 11/06/2023 9:00  Heather Heather: [id number] Patient Account [name]: [id number] Date of Birth/Sex: Treating RN: [birth year] (47 y.o. Heather Heather Primary Care Dauna Ziska: [name] Other Clinician: [name] Referring Dustina Scoggin: Treating Jakorey Mcconathy/Extender: Bethena Andre [name] Weeks in Treatment: 85 Wound Status Wound [name]: 12R Primary Diabetic Wound/Ulcer of the Lower Extremity Etiology: Wound Location: Left, Medial, Plantar Foot Wound Open Wounding Event: Pressure Injury Status: Date Acquired: 03/16/2020 Comorbid Chronic sinus problems/congestion, Middle ear problems, Anemia, Weeks Of Treatment: 85 History: Chronic Obstructive Pulmonary Disease (COPD), Congestive Heart Clustered Wound: No Heather Heather, Heather Heather ([id number]) [id number] Page 7 of 8 Clustered Wound: No Failure, Type II Diabetes, End Stage Renal Disease, History of pressure wounds, Neuropathy Wound Measurements Length: (cm) 1.5 Width: (cm) 1 Depth: (cm) 0.4 Area: (cm) 1.178 Volume: (cm) 0.471 % Reduction in Area: 75% % Reduction in Volume: 50% Epithelialization: Medium (34-66%) Wound Description Classification: Grade 3 Wound Margin: Flat and Intact Exudate Amount: Medium Exudate Type: Serosanguineous Exudate Color: red, brown Foul Odor After Cleansing: No Slough/Fibrino Yes Wound Bed Granulation Amount: Medium (34-66%) Exposed Structure Granulation Quality: Pink Fascia Exposed: No Necrotic Amount: None Present (0%) Fat Layer (Subcutaneous Tissue) Exposed: Yes Tendon Exposed: No Muscle Exposed: No Joint Exposed: No Bone Exposed: No Treatment Notes Wound #12R (Foot) Wound Laterality: Plantar, Left, Medial Cleanser Vashe 5.8 (oz) Discharge Instruction: Use vashe 5.8 (oz) as directed Peri-Wound Care Topical Mupirocin   Ointment Discharge Instruction: Apply as directed by Alfonza Toft. Primary Dressing Hydrofera Blue Ready Transfer Foam, 2.5x2.5 (in/in) Discharge  Instruction: Apply Hydrofera Blue Ready to wound bed as directed Secondary Dressing Gauze Discharge Instruction: As directed: dry, moistened with saline or moistened with Dakins Solution Secured With Medipore T - 44M Medipore H Soft Cloth Surgical T ape ape, 2x2 (in/yd) Kerlix Roll Sterile or Non-Sterile 6-ply 4.5x4 (yd/yd) Discharge Instruction: Apply Kerlix as directed Stretch Net Dressing, Latex-free, Size 5, Small-Head / Shoulder / Thigh Compression Wrap Compression Stockings Add-Ons Electronic Signature(s) Signed: 11/06/2023 4:05:20 PM By: Alverta Sailors RN Signed: 11/06/2023 4:48:09 PM By: [name] Entered By: [name] on 11/06/2023 09:17:14 CECILIE QUAKER Heather ([id number]) [id number] Page 8 of 8 -------------------------------------------------------------------------------- Vitals Details Patient Name: Date of Service: ADESSA, PRIMIANO 11/06/2023 9:00 Heather Heather: [id number] Patient Account [name]: [id number] Date of Birth/Sex: Treating RN: 10-16-1976 (47 y.o. Heather Heather Primary Care Arlana Canizales: [name] Other Clinician: [name] Referring Jourdin Connors: Treating Liese Dizdarevic/Extender: Bethena Andre [name] Devra in Treatment: 74 Vital Signs Time Taken: 09:08 Temperature (F): 99.2 Height (in): 69 Pulse (bpm): 60 Weight (lbs): 178 Respiratory Rate (breaths/min): 18 Body Mass Index (BMI): 26.3 Blood Pressure (mmHg): 104/66 Reference Range: 80 - 120 mg / dl Electronic Signature(s) Signed: 11/06/2023 4:48:09 PM By: [name] Entered By: [name] on 11/06/2023 09:12:30

## 2023-11-08 NOTE — Progress Notes (Addendum)
 TEDDIE, CURD [name] ([id number]) [id number] Page 1 of 22 Visit Report for 11/06/2023 Chief Complaint Document Details Patient Name: Date of Service: [name], [name] 11/06/2023 9:00 [name]: [id number] Patient Account Number: [id number] Date of Birth/Sex: Treating RN: 05-05-1976 (48 y.o. [name] Primary Care Provider: [name] Other Clinician: [name] Referring Provider: Treating Provider/Extender: [name] [name] Weeks in Treatment: 36 Information Obtained from: Patient Chief Complaint Left foot ulcer Electronic Signature(s) Signed: 11/06/2023 9:24:02 AM By: Heather Andre PA-C Entered By: [name] on 11/06/2023 09:24:02 -------------------------------------------------------------------------------- Debridement Details Patient Name: Date of Service: [name] 11/06/2023 9:00 [name]: [id number] Patient Account Number: [id number] Date of Birth/Sex: Treating RN: 03-31-1976 (48 y.o. [name] Primary Care Provider: [name] Other Clinician: [name] Referring Provider: Treating Provider/Extender: [name] [name] Weeks in Treatment: 85 Debridement Performed for Assessment: Wound #12R Left,Medial,Plantar Foot Performed By: Physician Heather Andre, PA-C The following information was scribed by: [name] The information was scribed for: [name] Debridement Type: Debridement Severity of Tissue Pre Debridement: Fat layer exposed Level of Consciousness (Pre-procedure): Awake and Alert Pre-procedure Verification/Time Out Yes - 09:30 Taken: Start Time: 09:30 Percent of Wound Bed Debrided: 100% T Area Debrided (cm): otal 1.18 Tissue and other material debrided: Viable, Non-Viable, Callus, Slough, Subcutaneous, Slough Level: Skin/Subcutaneous Tissue Debridement Description: Excisional Instrument: Curette Bleeding: Minimum Hemostasis Achieved:  Pressure Vickers, Kaelie [name] ([id number]) [id number] Page 2 of 22 Response to Treatment: Procedure was tolerated well Level of Consciousness (Post- Awake and Alert procedure): Post Debridement Measurements of Total Wound Length: (cm) 1.5 Width: (cm) 1 Depth: (cm) 0.4 Volume: (cm) 0.471 Character of Wound/Ulcer Post Debridement: Stable Severity of Tissue Post Debridement: Fat layer exposed Post Procedure Diagnosis Same as Pre-procedure Electronic Signature(s) Signed: 11/06/2023 4:05:20 PM By: Heather Sailors RN Signed: 11/06/2023 4:43:18 PM By: Heather Andre PA-C Signed: 11/06/2023 4:48:09 PM By: [name] Entered By: [name] on 11/06/2023 09:36:53 -------------------------------------------------------------------------------- HPI Details Patient Name: Date of Service: Heather [name], [name]. 11/06/2023 9:00 [name]: [id number] Patient Account Number: [id number] Date of Birth/Sex: Treating RN: [birth year] (48 y.o. [name] Primary Care Provider: [name] Other Clinician: [name] Referring Provider: Treating Provider/Extender: [name] [name] Devra in Treatment: 21 History of Present Illness HPI Description: 01/18/18-She is here for initial evaluation of the left great toe ulcer. She is [name] poor historian in regards to timeframe in detail. She states approximately 4 weeks ago she lacerated her toe on something in the house. She followed up with her primary care who placed her on Bactrim and ultimately [name] second dose of Bactrim prior to coming to wound clinic. She states she has been treating the toe with peroxide, Betadine  and [name] Band-Aid. She did not check her blood sugar this morning but checked it yesterday morning it was 327; she is unaware of [name] recent A1c and there are no current records. She saw Dr. she would've orthopedics last week for an old injury to the left ankle, she states he did not see her toe, nor did  she bring it to his attention. She smokes approximately 1 pack cigarettes [name] day. Her social situation is concerning, she arrives this morning with her mother who appears extremely intoxicated/under the influence; her mother was asked to leave the room and be monitored by the patient's grandmother. The patient's aunt then accompanied the patient and the room throughout the rest of the appointment. We  had [name] lengthy discussion regarding the deleterious effects of uncontrolled hyperglycemia and smoking as it relates to wound healing and overall health. She was strongly encouraged to decrease her smoking and get her diabetes under better control. She states she is currently on [name] diet and has cut down her [facility] consumption. The left toe is erythematous, macerated and slightly edematous with malodor present. The edema in her left foot is below her baseline, there is no erythema streaking. We will treat her with Santyl, doxycycline ; we have ordered and xray, culture and provided [name] Peg assist surgical shoe and cultured the wound. 01/25/18-She is here in follow-up evaluation for [name] left great toe ulcer and presents with an abscess to her suprapubic area. She states her blood sugars remain elevated, feeling sick and if levels are below 250, but she is trying. She has made no attempt to decrease her smoking stating that we can't take away her food in her cigarettes. She has been compliant with offloading using the PEG assist you. She is using Santyl daily. the culture obtained last week grew staph aureus and Enterococcus faecalis; continues on the doxycycline  and Augmentin  was added on Monday. The suprapubic area has erythema, no femoral variation, purple discoloration, minimal induration, was accessed with [name] cotton tip applicator with sanguinopurulent drainage, this was cultured, I suspect the current antibiotic treatment will cover and we will not add anything to her current treatment plan. She was  advised to go to urgent care or ER with any change in redness, induration or fever. 02/01/18-She is here in follow-up evaluation for left great toe ulcers and [name] new abdominal abscess from last week. She was able to use packing until earlier this week, where she forgot it was there. She states she was feeling ill with GI symptoms last week and was not taking her antibiotic. She states her glucose levels have been predominantly less than 200, with occasional levels between 200-250. She thinks this was contributing to her GI symptoms as they have resolved without intervention. There continues to be significant laceration to left toe, otherwise it clinically looks stable/improved. There is now less superficial opening to the lateral aspect of the great toe that was residual blister. We will transition to Hydrofera Blue to all wounds, she will continue her Augmentin . If there is no change or deterioration next week for reculture. 02/08/18-She is here in follow-up evaluation for left great toe ulcer and abdominal ulcer. There is an improvement in both wounds. She has been wrapping her left toe with coban, not by our direction, which has created an area of discoloration to the medial aspect; she has been advised to NOT use coban secondary to her neuropathy. She states her glucose levels have been high over this last week ranging from 200-350, she continues to smoke. She admits to being less compliant with her offloading shoe. We will continue with same treatment plan and she will follow-up next week. 02/15/18-She is here in follow-up evaluation for left great toe ulcer and abdominal ulcer. The abdominal ulcer is epithelialized. The left great toe ulcer is improved and all injury from last week using the Coban wrap is resolved, the lateral ulcer is healed. She admits to noncompliance with wearing offloading shoe and admits to glucose levels being greater than 300 most of the week. She continues to smoke and  expresses no desire to quit. There is one area medially that probes deeper than it has historically, erythema to the toe and dorsal foot has consistently waxed  and waned. There is no overt signs of cellulitis or infection [name], [name] ([id number]) [id number] Page 3 of 22 but we will culture the wound for any occult infection given the new area of depth and erythema. We will hold off on sensitivities for initiation of antibiotic therapy. 02/22/18-She is here in follow up evaluation for left great toe ulcer. There is overall significant improvement in both wound appearance, erythema and edema with changes made last week. She was not initiated on antibiotic therapy. Culture obtained last week showed oxacillin sensitive staph aureus, sensitive to clindamycin . Clindamycin  has been called into the pharmacy but she has been instructed to hold off on initiation secondary to overall clinical improvement and her history of antibiotic intolerance. She has been instructed to contact the clinic with any noted changes/deterioration and the wound, erythema, edema and/or pain. She will follow-up next week. She continues to smoke and her glucose levels remain elevated >250; she admits to compliance with offloading shoe 03/01/18 on evaluation today patient appears to be doing fairly well in regard to her left first toe ulcer. She has been tolerating the dressing changes with the Hydrofera Blue Dressing without complication and overall this has definitely showed signs of improvement according to records as well is what the patient tells me today. I'm very pleased in that regard. She is having no pain today 03/08/18 She is here for follow up evaluation of [name] left great toe ulcer. She remains non-compliant with glucose control and smoking cessation; glucose levels consistently >200. She states that she got new shoe inserts/peg assist. She admits to compliance with offloading. Since my last  evaluation there is significant improvement. We will switch to prisma at this time and she will follow up next week. She is noted to be tachycardic at this appointment, heart rate 120s; she has [name] history of heart rate 70-130 according to our records. She admits to extreme agitation r/t personal issues; she was advised to monitor her heartrate and contact her physician if it does not return to [name] more normal range (<100). She takes cardizem twice daily. 03/15/18-She is here in follow-up evaluation for left great toe ulcer. She remains noncompliant with glucose control and smoking cessation. She admits to compliance with wearing offloading shoe. The ulcer is improved/stable and we will continue with the same treatment plan and she will follow-up next week 03/22/18-She is here for evaluation for left great toe ulcer. There continues to be significant improvement despite recurrent hyperglycemia (over 500 yesterday) and she continues to smoke. She has been compliant with offloading and we will continue with same treatment plan and she will follow-up next week. 03/29/18-She is here for evaluation for left great toe ulcer. Despite continuing to smoke and uncontrolled diabetes she continues to improve. She is compliant with offloading shoe. We will continue with the same treatment plan and she will follow-up next week 04/05/18- She is here in follow up evaluation for [name] left great toe ulcer; she presents with small pustule to left fifth toe (resembles ant bite). She admits to compliance with wearing offloading shoe; continues to smoke or have uncontrolled blood glucose control. There is more callus than usual with evidence of bleeding; she denies known trauma. 04/12/18-She is here for evaluation of left great toe ulcer. Despite noncompliance with glycemic control and smoking she continues to make improvement. She continues to wear offloading shoe. The pustule, that was identified last week, to the left fifth toe is  resolved. She will follow-up in 2 weeks 05/03/18-she  is seen in follow-up evaluation for [name] left great toe ulcer. She is compliant with offloading, otherwise noncompliant with glycemic control and smoking. She has plateaued and there is minimal improvement noted. We will transition to Hydrofera Blue, replaced the insert to her surgical shoe and she will follow-up in one week 05/10/18- She is here in follow up evaluation for [name] left great toe ulcer. It appears stable despite measurement change. We will continue with same treatment plan and follow up next week. 05/24/18-She is seen in follow-up evaluation for [name] left great toe ulcer. She remains compliant with offloading, has made significant improvement in her diet, decreasing the amount of sugar/soda. She said her recent A1c was 10.9 which is lower than. She did see [name] diabetic nutritionist/educator yesterday. She continues to smoke. We will continue with the same treatment plan and she'll follow-up next week. 05/31/18- She is seen in follow-up evaluation for left great toe ulcer. She continues to remain compliant with offloading, continues to make improvement in her diet, increasing her water and decreasing the amount of sugar/soda. She does continue to smoke with no desire to quit. We will apply Prisma to the depth and Hydrofera Blue over. We have not received insurance authorization for oasis. She will follow up next week. 06/07/18-She is seen in follow-up evaluation for left great toe ulcer. It has stalled according to today's measurements although base appears stable. She says she saw [name] diabetic educator yesterday; her average blood sugars are less than 300 which is an improvement for her. She continues to smoke and states that's my next step She continues with water over soda. We will order for xray, culture and reinstate ace wrap compression prior to placing apligraf for next week. She is voicing no complaints or concerns. Her dressing will change to  iodoflex over the next week in preparation for apligraf. 06/14/18-She is seen in follow-up evaluation for left great toe ulcer. Plain film x-ray performed last week was negative for osteomyelitis. Wound culture obtained last week grew strep B and OSSA; she is initiated on keflex  and cefdinir  today; there is erythema to the toe which could be from ace wrap compression, she has [name] history of wrapping too tight and has has been encouraged to maintain ace wraps that we place today. We will hold off on application of apligraf today, will apply next week after antibiotic therapy has been initiated. She admits today that she has resumed taking [name] shower with her foot/toe submerged in water, she has been reminded to keep foot/toe out of the bath water. She will be seen in follow up next week 06/21/18-she is seen in follow-up evaluation for left great toe ulcer. She is tolerating antibiotic therapy with no GI disturbance. The wound is stable. Apligraf was applied today. She has been decreasing her smoking, only had 4 cigarettes yesterday and 1 today. She continues being more compliant in diabetic diet. She will follow-up next week for evaluation of site, if stable will remove at 2 weeks. 06/28/18- She is here in follow up evalution. Apligraf was placed last week, she states the dressing fell off on Tuesday and she was dressing with hydrofera blue. She is healed and will be discharged from the clinic today. She has been instructed to continue with smoking cessation, continue monitoring glucose levels, offloading for an additional 4 weeks and continue with hydrofera blue for additional two weeks for any possible microscopic opening. Readmission: 08/07/18 on evaluation today patient presents for reevaluation concerning the ulcer of her right  great toe. She was previously discharged on 06/28/18 healed. Nonetheless she states that this began to show signs of drainage she subsequently went to her primary care provider.  Subsequently an x-ray was performed on 08/01/18 which was negative. The patient was also placed on antibiotics at that time. Fortunately they should have been effective for the infection. Nonetheless she's been experiencing some improvement but still has [name] lot of drainage coming from the wound itself. 08/14/18 on evaluation today patient's wound actually does show signs of improvement in regard to the erythema at this point. She has completed the antibiotics. With that being said we did discuss the possibility of placing her in [name] total contact cast as of today although I think that I may want to give this just [name] little bit more time to ensure nothing recurrence as far as her infection is concerned. I do not want to put in the cast and risk infection at that time if things are not completely resolved. With that being said she is gonna require some debridement today. 08/21/18 on evaluation today patient actually appears to be doing okay in regard to her toe ulcer. She's been tolerating the dressing changes without complication. With that being said it does appear that she is ready and in fact I think it's appropriate for us  to go ahead and initiate the total contact cast today. Nonetheless she will require some sharp debridement to prepare the wound for application. Overall I feel like things have been progressing well but we do need to do something to get this to close more readily. 08/24/18 patient seen today for reevaluation after having had the total contact cast applied on Tuesday. She seems to have done very well the wound appears to be doing great and overall I'm pleased with the progress that she's made. There were no abnormal areas of rubbing from the cast on her lower extremity. 08/30/18 on evaluation today patient actually appears to be completely healed in regard to her plantar toe ulcer. She tells me at this point she's been having [name] lot of issues with the cast. She almost fell [name] couple of  times the state shall the step of her dog [name] couple times as well. This is been [name] very frustrating process for her other nonetheless she has completely healed the wound which is excellent news. Overall there does not appear to be the evidence of infection at this time which is great news. 09/11/18 evaluation today patient presents for follow-up concerning her great toe ulcer on the left which has unfortunately reopened since I last saw her which was only [name] couple of weeks ago. Unfortunately she was not able to get in to get the shoe and potentially the AFO that's gonna be necessary due to her left foot drop. She continues with offloading shoe but this is not enough to prevent her from reopening it appears. When we last had her in the total contact cast she did well from [name] healing standpoint but unfortunately the wound reopened as soon as she came out of the cast within just [name] couple of weeks. 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 This is also part of the reason why she falls on [name] regular basis. I just do not believe that is gonna be  safe for us  to reinitiate the total contact cast as last time we had this on she fell 3 times one week which is definitely not normal for her. 09/18/18 upon evaluation today the patient actually appears to be doing about the same in regard to her toe ulcer. She did not contact [employer] as I asked her to even though I had given her the prescription. In fact she actually states that she has no idea where the prescription is. She did apparently call [employer] and they told her that all she needed to do was bring the prescription in order to be able to be seen and work on getting the AFO for her left foot. With all that being said she still does not have an appointment and I'm not  sure were things stand that regard. I will give her [name] new prescription today in order to contact them to [name], [name] ([id number]) [id number] Page 4 of 22 get this set up. 09/25/18 on evaluation today patient actually appears to be doing about the same in regard to her toes ulcer. She does have [name] small areas which seems to have [name] lot of callous buildup around the edge of the wound which is going to need sharp debridement today. She still is waiting to be scheduled for evaluation with [facility] for possibility of an AFO. She states there supposed to call her tomorrow to get this set up. Unfortunately it does appear that her foot specifically the toe area is showing signs of erythema. There does not appear to be any systemic infection which is in these good news. 10/02/18 on evaluation today patient actually appears to be doing about the same in regard to her toe ulcer. This really has not done too well although it's not significantly larger it's also not significantly smaller. She has been tolerating the dressing changes without complication. She actually has her appointment with [facility] and [facility] tomorrow to hopefully be measured for obtaining and AFO splint. I think this would be helpful preventing this from reoccurring. We had contemplated starting the cast this week although to be honest I am reluctant to do that as she's been having nausea, vomiting, and seizure activity over the past three days. She has [name] history of seizures and have been told is nothing that can be done for these. With that being said I do believe that along with the seizures have the nausea vomiting which upon further questioning doesn't seem to be the normal for her and makes me concerned for the possibility of infection or something else going on. I discussed this with the patient and her mother during the office visit today. I do not feel the wound is effective but maybe something else. The  responses this was this just happens to her at times and we don't know why. They did not seem to be interested in going to the hospital to have this checked out further. 10/09/18 on evaluation today patient presents for follow-up concerning her ongoing toe ulcer. She has been tolerating the dressing changes without complication. Fortunately there does not appear to be any evidence of infection which is great news however I do think that the patient would benefit from going ahead for with the total contact cast. She's actually in [name] wheelchair today she tells me that she will use her walker if we initiate the cast. I was very specific about the fact that if we were gonna do the cast I wanted to make sure that she was  using the walker in order to prevent any falls. She tells me she does not have stairs that she has to traverse on [name] regular basis at her home. She has not had any seizures since last week again that something that happens to her often she tells me she did talk to [employer] and they said that it may take up to three weeks to get the brace approved for her. Hopefully that will not take that long but nonetheless in the meantime I do think the cast could be of benefit. 10/12/18 on evaluation today patient appears to be doing rather well in regard to her toe ulcer. It's just been [name] few days and already this is significantly improved both as far as overall appearance and size. Fortunately there's no sign of infection. She is here for her first obligatory cast change. 10/19/18 Seen today for follow up and management of left great toe ulcer. Wound continues to show improvement. Noted small open area with seroussang drainage with palpation. Denies any increased pain or recent fevers during visit. She will continue calcium  alginate with offloading shoe. Denies any questions or concerns during visit. 10/26/18 on evaluation today patient appears to be doing about the same as when I last saw her in regard  to her wound bed. Fortunately there does not appear to be any signs of infection. Unfortunately she continues to have [name] breakdown in regard to the toe region any time that she is not in the cast. It takes almost no time at all for this to happen. Nonetheless she still has not heard anything from the brace being made by [employer] as to when exactly this will be available to her. Fortunately there is no signs of infection at this time. 10/30/18 on evaluation today patient presents for application of the total contact cast as we just received him this morning. Fortunately we are gonna be able to apply this to her today which is great news. She continues to have no significant pain which is good news. Overall I do feel like things have been improving while she was the cast is when she doesn't have [name] cast that things get worse. She still has not really heard anything from [employer] regarding her brace. 11/02/18 upon evaluation today patient's wound already appears to be doing significantly better which is good news. Fortunately there does not appear to be any signs of infection also good news. Overall I do think the total contact cast as before is helping to heal this area unfortunately it's just not gonna likely keep the area closed and healed without her getting her brace at least. Again the foot drop is [name] significant issue for her. 11/09/18 on evaluation today patient appears to be doing excellent in regard to her toe ulcer which in fact is completely healed. Fortunately we finally got the situation squared away with the paperwork which was needed to proceed with getting her brace approved by Medicaid. I have filled that out unfortunately that information has been sent to the orthopedic office that I worked at 2 1/2 years ago and not tired Current wound care measures. Fortunately she seems to be doing very well at this time. 11/23/18 on evaluation today patient appears to be doing More Poorly Compared to Last  Time I Saw Her. At [facility] She Had Completely Healed. Currently she is continuing to have issues with reopening. She states that she just found out that the brace was approved through [place] now she just has to go  get measured in order to have this fitted for her and then made. Subsequently she does not have an appointment for this yet that is going to complicate things we obviously cannot put her back in the cast if we do not have everything measured because they're not gonna be able to measure her foot while she is in the cast. 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 Obviously this is unfortunate and does have me somewhat worried as well. 11/30/18 on evaluation today patient's toe ulcer actually appears to be doing fairly well. The good news is she will be getting her brace in the shoes next week on Wednesday. Hopefully we will be able to get this to heal without having to go back in the cast however she may need the cast in order to get the wound completely heal and then go from there. Fortunately there's no signs of infection at this time. 12/07/18 on evaluation today patient fortunately did receive her brace and she states she could tell this definitely makes her walk better. With that being said she's been having issues with her toe where she noticed yesterday there was [name] lot of tissue that was loosing off this appears to be much larger than what it was previous. She also states that her leg has been read putting much across the top of her foot just about the ankle although this seems to be receiving somewhat. The total area is still red and appears to be someone infected as best I can tell. She is previously taken Bactrim and that may be [name] good option for her today as well. We are gonna see what I wound culture shows as well and I think that this is definitely appropriate. With that being said  outside of the culture I still need to initiate something in the interim and that's what I'm gonna go ahead and select Bactrim is [name] good option for her. 12/14/18 on evaluation today patient appears to be doing better in regard to her left great toe ulcer as compared to last week's evaluation. There's still some erythema although this is significantly improved which is excellent news. Overall I do believe that she is making good progress is still gonna take some time before she is where I would like her to be from the standpoint of being able to place her back into the total contact cast. Hopefully we will be where we need to be by next week. 12/21/18 on evaluation today patient actually appears to be doing poorly in regard to her toe ulcer. She's been tolerating the dressing changes without complication. Fortunately there's no signs of systemic infection although she does have [name] lot of drainage from the toe ulcer and this does seem to be causing some issues at this point. She does have erythema on the distal portion of her toe that appears to be likely cellulitis. 12/28/18 on evaluation today patient actually appears to be doing [name] little better in my pinion in regard to her toe ulcer. With that being said she still does have some evidence of infection at this time and for her culture she had both E. coli as well as enterococcus as organisms noted on evaluation. For that reason I think that though the Keflex  likely has treated the E. coli rather well this has really done nothing for the enterococcus. We are going to have to initiate  treatment for this specifically. 01/04/19 on evaluation today patient's toe actually appears to be doing better from the standpoint of infection. She currently would like to see about putting the cash back on I think that this is appropriate as long as she takes care of it and keeps it from getting wet. She is gonna have some drainage we can definitely pass this up with Drawtex  and alginate to try to prevent as much drainage as possible from causing the problems. With that being said I do want to at least try her with the cast between now and Tuesday. If there any issues we can't continue to use it then I will discontinue the use of the cast at that point. 01/08/19 on evaluation today patient actually appears to be doing very well as far as her foot ulcer specifically the great toe on the left is concerned. She did have an area of rubbing on the medial aspect of her left ankle which again is from the cast. Fortunately there's no signs of infection at this point in this appears to be [name] very slight skin breakdown. The patient tells me she felt it rubbing but didn't think it was that bad. Fortunately there is no signs of active infection at this time which is good news. No fevers, chills, nausea, or vomiting noted at this time. [name], [name] ([id number]) [id number] Page 5 of 22 01/15/19 on evaluation today patient actually appears to be doing well in regard to her toe ulcer. Again as previous she seems to do well and she has the cast on which indicates to me that during the time she doesn't have [name] cast on she's putting way too much pressure on this region. Obviously I think that's gonna be an issue as with the current national emergency concerning the Covid-19 Virus it has been recommended that we discontinue the use of total contact casting by the chief medical officer of our company, Dr. Marinda. The reasoning is that if [name] patient becomes sick and cannot come into have the cast removed they could not just leave this on for an additional two weeks. Obviously the hospitals also do not want to receive patient's who are sick into the emergency department to potentially contaminate the region and spread the Covid-19 Virus among other sick individuals within the hospital system. Therefore at this point we are suspending the use of total contact cast until  the current emergency subsides. This was all discussed with the patient today as well. 01/22/19 on evaluation today patient's wound on her left great toe appears to be doing slightly worse than previously noted last week. She tells me that she has been on this quite [name] bit in fact she tells me she's been awake for 38 straight hours. This is due to the fact that she's having to care for grandparents because nobody else will. She has been taking care of them for five the last seven days since I've seen her they both have dementia his is from [name] stroke and her grandmother's was progressive. Nonetheless she states even her mom who knows her condition and situation has only help two of those days to take care of them she's been taking care of the rest. Fortunately there does not appear to be any signs of active infection in regard to her toe at this point although obviously it doesn't look as good as it did previous. I think this is directly related to her not taking off the pressure and friction by  way of taking things easy. Though I completely understand what's going on. 01/29/19 on evaluation today patient's tools are actually appears to be showing some signs of improvement today compared to last week's evaluation as far as not necessarily the overall size of the wound but the fact that she has some new skin growth in between the two ends of the wound opening. Overall I feel like she has done well she states that she had [name] family member give her what sounds to be [name] CAM walker boot which has been helpful as well. 02/05/19 on evaluation today patient's wound bed actually appears to be doing significantly better in regard to her overall appearance of the size of the wound. With that being said she is still having an issue with offloading efficiently enough to get this to close. Apparently there is some signs of infection at this point as well unfortunately. Previously she's done well of Augmentin  I really do not  see anything that needs to be culture currently but there theme and cellulitis of the foot that I'm seeing I'm gonna go ahead and place her on an antibiotic today to try to help clear this up. 02/12/2019 on evaluation today patient actually appears to be doing poorly in regard to her overall wound status. She tells me she has been using her offloading shoe but actually comes in today wearing her tennis shoe with the AFO brace. Again as I previously discussed with her this is really not sufficient to allow the area to heal appropriately. Nonetheless she continues to be somewhat noncompliant and I do wonder based on what she has told my nurse in the past as to whether or not [name] good portion of this noncompliance may be recreational drug and alcohol related. She has had [name] history of heroin overdose and this was fairly recently in the past couple of months that have been seeing her. Nonetheless overall I feel like her wound looks significantly worse today compared to what it was previous. She still has significant erythema despite the Augmentin  I am not sure that this is an appropriate medication for her infection I am also concerned that the infection may have gone down into her bone. 02/19/19 on evaluation today patient actually appears to be doing about the same in regard to her toe ulcer. Unfortunately she continues to show signs of bone exposure and infection at this point. There does not appear to be any evidence of worsening of the infection but I'm also not really sure that it's getting significantly better. She is on the Augmentin  which should be sufficient for the Staphylococcus aureus infection that she has at this point. With that being said she may need IV antibiotics to more appropriately treat this. We did have [name] discussion today about hyperbaric option therapy. 02/28/19 on evaluation today patient actually appears to be doing much worse in regard to the wound on her left great toe as compared to  even my previous evaluation last week. Unfortunately this seems to be training in [name] pretty poor direction. Her toe was actually now starting to angle laterally and I can actually see the entire joint area of the proximal portion of the digit where is the distal portion of the digit again is no longer even in contact with the joint line. Unfortunately there's [name] lot more necrotic tissue around the edge and the toe appears to be showing signs of becoming gangrenous in my pinion. I'm very concerned about were things stand at this point.  She did see infectious disease and they are planning to send in [name] prescription for Sivextro  for her and apparently this has been approved. With that being said I don't think she should avoid taking this but at the same time I'm not sure that it's gonna be sufficient to save her toe at this point. She tells me that she still having to care for grandparents which I think is putting quite [name] bit of strain on her foot and specifically the total area and has caused this to break down even to [name] greater degree than would've otherwise been expected. 03/05/19 on evaluation today patient actually appears to be doing quite well in regard to her toe all things considering. She still has bone exposed but there appears to be much less your thing on overall the appearance of the wound and the toe itself is dramatically improved. She still does have some issues currently obviously with infection she did see vascular as well and there concerned that her blood flow to the toad. For that reason they are setting up for an angiogram next week. 03/14/19 on evaluation today patient appears to be doing very poor in regard to her toe and specifically in regard to the ulceration and the fact that she's starting to notice the toe was leaning even more towards the lateral aspect and the complete joint is visible on the proximal aspect of the joint. Nonetheless she's also noted [name] significant odor and the  tip of the toe is turning more dark and necrotic appearing. Overall I think she is getting worse not better as far as this is concerned. For that reason I am recommending at this point that she likely needs to be seen for likely amputation. READMISSION 03/19/2021 This is [name] patient that we cared for in this clinic for [name] prolonged period of time in 2019 and 2020 with [name] left foot and left first toe wound. 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 Lennie. In December 2021 she had an ulcer on her right great toe as well as the fourth and fifth toes. 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 She also had an angiogram at that time and underwent angioplasty of the right anterior tibial artery. In any case she claims that the wound on the right foot is closed I did not look at this today which was probably an oversight although I think that should be done next week. 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 According to Dr. Honore last review that she was out of the area being cared for by another physician but recently came back to his attention. 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 [name] culture of this area showed E. coli apparently before she came back to see Dr. Lennie she was supposed to be receiving antibiotics but she did not really take them. Nor is she offloading this area at all. Finally her last hemoglobin A1c listed in epic was in March 2022 at 14.1 she says things are [name] lot better since then although I am not sure. 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 She had this wound at the time. During this admission she had E. coli sepsis [name] TEE was negative. Past medical  history is extensive and includes  type 2 diabetes with peripheral neuropathy cardiomyopathy with an ejection fraction of 33%, hypertension, hyperlipidemia chronic renal failure stage III history of substance abuse with cocaine although she claims to be clean now verified by her mother. She is still [name] heavy cigarette smoker. She has [name] history of bipolar disorder seizure disorder ABI in our clinic was 1.05 6/1; left midfoot in the setting of [name] TMA done previously. Round circular wound with [name] knuckle of protruding tissue. The problem is that the knuckle was not attached to any of the surrounding granulation and this probed proximally widely I removed [name] large portion of this tissue. This wound goes with considerable undermining laterally. I do not feel any bone there was no purulence but this is [name] deep wound. 6/8; in spite of the debridement I did last week. She arrives with [name] wound looking exactly the same. [name] protruding knuckle of tissue nonadherent to most of the surrounding tissue. There is considerable depth around this from 6-12 o'clock at 2.7 cm and undermining of 1 cm. This does not look overtly infected and the x- ray I did last week was negative for any osseous abnormalities. We have been using silver collagen 6/15; deep tissue culture I did last week showed moderate staph aureus and moderate Pseudomonas. This will definitely require prolonged antibiotic therapy. The pathology on the protuberant area was negative for malignancy fungus etc. 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 We have been using silver collagen. I am going to be prescribing Levaquin for 2 weeks. Her CT scan of the foot is down for 7/5 6/22; CT scan of the foot on 7 5. She says she has hardware in the left leg from her previous fracture. She is on the Levaquin for the deep tissue culture I did that showed methicillin sensitive staph aureus and Pseudomonas. I gave her [name] 2-week supply and she will  have another week. She arrives in clinic today with the same protuberant tissue however this is nonadherent to the tissue surrounding it. I am really at [name] loss to explain this unless there is underlying deep tissue infection [name], [name] ([id number]) [id number] Page 6 of 22 6/29; patient presents for 1 week follow-up. She has been using collagen to the wound bed. She reports taking her antibiotics as prescribed.She has no complaints or issues today. She denies signs of infection. 7/6; patient presents for one week followup. She has been using collagen to the wound bed. She states she is taking Levaquin however at times she is not able to keep it down. She denies signs of infection. 7/13; patient presents for 1 week follow-up. She has been using silver alginate to the wound bed. She still has nausea when taking her antibiotics. She denies signs of infection. 7/20; patient presents for 1 week follow-up. She has been using silver alginate with gentamicin cream to the wound bed. She denies any issues and has no complaints today. She denies signs of infection. 7/27; patient presents for 1 week follow-up. She continues to use silver alginate with gentamicin cream to the wound bed. She reports starting her antibiotics. She has no issues or complaints. Overall she reports stability to the wound. 8/3; patient presents for 1 week follow-up. She has been using silver alginate with gentamicin cream to the wound bed. She reports completing all antibiotics. She has no issues or complaints today. She denies signs of infection. 8/17; patient presents for 2-week follow-up.  He is to use silver alginate to the wound bed. She has no issues or complaints today. She denies signs of infection. She reports her pain has improved in her foot since last clinic visit 8/24; patient presents for 1 week follow-up. She continues to use silver alginate to the wound bed. She has no issues or  complaints. She denies signs of infection. Pain is stable. 9/7; patient presents for follow-up. She missed her last week appointment due to feeling ill. She continues to use silver alginate. She has [name] new wound to the right lower extremity that is covered in eschar. She states It occurred over the past week and has no idea how it started. She currently denies signs of infection. 9/14; patient presents for follow-up. T the left foot wound she has been using gentamicin cream and silver alginate. T the right lower extremity wound she has o o been keeping this covered and has not obtain Santyl. 9/21; patient presents for follow-up. She reports using gentamicin cream and silver alginate to the left foot and Santyl to the right lower extremity wound. She has no issues or complaints today. She denies signs of infection. 9/28; patient presents for follow-up. She reports [name] new wound to her right heel. She states this occurred [name] few days ago and is progressively gotten worse. She has been trying to clean the area with [name] Q-tip and Santyl. She reports stability in the other 2 wounds. She has been using gentamicin cream and silver alginate to the left foot and Santyl to the right lower extremity wound. 10/12; patient presents for follow-up. She reports improvement to the wound beds. She is seeing vein and vascular to discuss the potential of [name] left BKA. She states they are going to do an arteriogram. She continues to use silver alginate with dressing changes to her wounds. 11/2; patient presents for follow-up. She states she has not been doing dressing changes to the wound beds. She states she is not able to offload the areas. She reports chronic pain to her left foot wound. 11/9; patient presents for follow-up. She came in with only socks on. She states she forgot to put on shoes. It is unclear if she is doing any dressing changes. She currently denies systemic signs of infection. 11/16; patient presents for  follow-up. She came again only with socks on. She states she does not wear shoes ever. It is unclear if she does dressing changes. She currently denies systemic signs of infection. 11/23; patient presents for follow-up. She wore her shoes today. It still unclear exactly what dressing she is using for each wound but she did states she obtained Dakin's solution and has been using this to the left foot wound. She currently denies signs of infection. 11/30; patient presents for follow-up. She has no issues or complaints today. She currently denies signs of infection. 12/7; patient presents for follow-up. She has no issues or complaints today. She has been using Hydrofera Blue to the right heel wound and Dakin solution to the left foot wound. Her right anterior leg wound is healed. She currently denies signs of infection. 12/14; patient presents for follow-up. She has been using Hydrofera Blue to the right heel and Dakin's to the left foot wounds. She has no issues or complaints today. She denies signs of infection. 12/21; patient presents for follow-up. She reports using Hydrofera Blue to the right heel and Dakin's to the left foot wound. She denies signs of infection. 12/28; patient presents for follow-up. She continues  to use Dakin's to the left foot wound and Hydrofera Blue to the right heel wound. She denies signs of infection. 1/4; patient presents for follow-up. She has no issues or complaints today. She denies signs of infection. 1/11; patient presents for follow-up. It is unclear if she has been dressing these wounds over the past week. She currently denies signs of infection. 1/18; patient presents for follow-up. She states she has been using Dakin's wet-to-dry dressings to the left foot. She has been using Hydrofera Blue to the right foot foot wound. She states that the anterior right leg wound has reopened and draining serous fluid. She denies signs of infection. 1/25; patient presents for  follow-up. She has no issues or complaints today. 2/1; patient presents for follow-up. She has no issues or complaints today. She denies signs of infection. 2/8; patient presents for follow-up. She has lost her surgical shoes. She did not have [name] dressing to the right heel wound. She currently denies signs of infection. 2/15; patient presents for follow-up. She reports more pain to the right heel today. She denies purulent drainage Or fever/chills 2/22; patient presents for follow-up. She reports taking clindamycin  over the past week. She states that she continues to have pain to her right heel. She reports purulent drainage. Readmission 03/16/2022 Ms. Maizee Bayon is [name] 48 year old female with [name] past medical history of type 2 diabetes, osteomyelitis to her feet, chronic systolic heart failure and bipolar disorder that presents to the clinic for bilateral feet wounds and right lower extremity wound. She was last seen in our clinic on 12/15/2021. At that time she had purulent drainage coming out of her right plantar foot and I recommended she go to the ED. She states she went to [facility] and has been there for the past 3 months. I cannot see the records. She states she had OR debridement and was on several weeks of IV antibiotics while inpatient. Since discharge she has not been taking care of the wound beds. She had nothing on her feet other than socks today. She currently denies signs of infection. 5/31; patient presents for follow-up. She has been using Dakin's wet-to-dry dressings to the wound beds on her feet bilaterally and antibiotic ointment to the right anterior leg wound. She had [name] wound culture done at last clinic visit that showed moderate Pseudomonas aeruginosa sensitive to ciprofloxacin. She currently denies systemic signs of infection. 6/14; patient presents for follow-up. She received Keystone 5 days ago and has been using this on the wound beds. She states that last week  she had to go to the hospital because she had increased warmth and erythema to the right foot. She was started on 2 oral antibiotics. She states she has been taking these. She currently denies systemic signs of infection. She has no issues or complaints today. 6/21; patient presents for follow-up. She states she has been using Keystone antibiotics to the wound beds. She has no issues or complaints today. She denies signs of infection. [name], [name] ([id number]) [id number] Page 7 of 22 6/28; patient presents for follow-up. She has been using Keystone antibiotics to the wound beds. She has no issues or complaints today. 7/12; patient presents for follow-up. Has been using Keystone antibiotics to the wound beds with calcium  alginate. She has no issues or complaints today. She never followed up with her orthopedic surgeon who did the OR debridement to the right foot. We discussed the total contact cast for the left foot and patient would like  to do this next week. 7/19; patient presents for follow-up. She has been using Keystone antibiotics with calcium  alginate to the wound beds. She has no issues or complaints today. Patient is in agreement to do the total contact cast of the left foot today. She knows to return later this week for the obligatory cast change. 05-13-2022 upon evaluation today patient's wound which she has the cast of the left leg actually appears to be doing significantly better. Fortunately I do not see any signs of active infection locally or systemically which is great news and overall I am extremely pleased with where we stand currently. 7/26; patient presents for follow-up. She has [name] cast in place for the past week. She states it irritated her shin. Other than that she tolerated the cast well. She states she would like [name] break for 1 week from the cast. We have been using Keystone antibiotic and Aquacel to both wound beds. She denies signs  of infection. 8/2; patient presents for follow-up. She has been using Keystone and Aquacel to the wound beds. She denies any issues and has no complaints. She is agreeable to have the cast placed today for the left leg. 06-03-2022 upon evaluation today patient appears to be doing well with regard to her wound she saw some signs of improvement which is great news. Fortunately I do not see any evidence of active infection locally or systemically at this time which is great news. No fevers, chills, nausea, vomiting, or diarrhea. 8/16; patient presents for follow-up. She has no issues or complaints today. We have been using Keystone and Aquacel to the wound beds. The left lower extremity is in [name] total contact cast. She is tolerated this well. 8/23; patient presents for follow-up. She has had the total contact cast on the left leg for the past week. Unfortunately this has rubbed and broken down the skin to the medial foot. She currently denies signs of infection. She has been using Keystone antibiotic to the right plantar foot wound. 8/30; patient presents for follow-up. We have held off on the total contact cast for the left leg for the past week. Her wound on the left foot has improved and the previous surrounding breakdown of skin has epithelialized. She has been using Keystone antibiotic to both wound beds. She has no issues or complaints today. She denies signs of infection. 9/6; patient presents for follow-up. She has ordered her's Keystone antibiotic and this is arriving this week. She has been doing Dakin's wet-to-dry dressings to the wound beds. She denies signs of infection. She is agreeable to the total contact cast today. 9/13; patient presents for follow-up. She states that the cast caused her left leg shin to be sore. She would like to take [name] break from the cast this week. She has been using Keystone antibiotic to the right plantar foot wound. She denies signs of infection. 9/20; patient  presents for follow-up. She has been using Keystone antibiotics to the wound beds with calcium  alginate to the right foot wound and Hydrofera Blue to the left foot wound. She is agreeable to having the cast placed today. She has been approved for Apligraf and we will order this for next clinic visit. 9/27; patient presents for follow-up. We have been using Keystone antibiotic with Hydrofera Blue to the left foot wound under [name] total contact cast. T the right o foot wound she has been using Keystone antibiotic and calcium  alginate. She declines [name] total contact cast today. Apligraf is available  for placement and she would like to proceed with this. 07-28-2022 upon evaluation today patient appears to be doing well currently in regard to her wound. She is actually showing signs of significant improvement which is great news. Fortunately I do not see any evidence of active infection locally nor systemically at this time. She has been seeing Dr. Rosan and to be honest has been doing very well with the cast. Subsequently she comes in today with [name] cast on and we did reapply that today as well. She did not really want to she try to talk me out of that but I explained that if she wanted to heal this is really the right way to go. Patient voiced understanding. In regard to her right foot this is actually [name] lot better compared to the last time I saw her which is also great news. 10/11; patient presents for follow-up. Apligraf and the total contact cast was placed to the left leg at last clinic visit. She states that her right foot wound had burning pain to it with the placement of Apligraf to this area. She has been doing Alta over this area. She denies signs of infection including increased warmth, erythema or purulent drainage. 11/1; 3-week follow-up. The patient fortunately did not have [name] total contact cast or an Apligraf and on the left foot. She has been using Keystone ABD pads and kerlix and her own  running shoes She arrives in clinic today with thick callus and [name] very poor surface on the left foot on the right nonviable skin subcutaneous tissue and [name] deep probing hole. 11/15; patient missed her last clinic appointment. She states she has not been dressing the wound beds for the past 2 weeks. She states that at she had [name] new roommate but is now going back to live with her mother. Apparently its been [name] distracting 2 weeks. Patient currently denies signs of infection. 11/22; patient presents for follow-up. She states she has been using Keystone antibiotic and Dakin's wet-to-dry dressings to the wound beds. She is agreeable for cast placement today. We had ordered Apligraf however this has not been received by our facility. 11/29; Patient had [name] total contact cast placed at last clinic visit and she tolerated this well. We were using silver alginate under the cast. Patient's been using Keystone antibiotic with Aquacel to the right plantar foot wound. She has no issues or complaints today. Apligraf is available for placement today. Patient would like to proceed with this. 12/6; patient presents for follow-up. She had Apligraf placed in standard fashion last clinic visit under the total contact cast to the left lower extremity. She has been using Keystone antibiotic and Aquacel to the right plantar foot wound. She has no issues or complaints today. 12/13; patient presents for follow-up. She has finished 5 Apligraf placements. Was told she would not qualify for more. We have been doing [name] total contact cast to the left lower extremity. She has been using Keystone antibiotic and Aquacel to the right plantar foot wound. She has no issues or complaints today. 12/20; patient presents for follow-up. We have been using Hydrofera Blue with Keystone antibiotic under [name] total contact cast of the left lower extremity. She reports using Keystone antibiotic and silver alginate to the right heel wound. She has no  issues or complaints today. 12/27; patient presents with [name] healthy wound on the left midfoot. We have Apligraf to apply that to that more also using [name] total contact cast. On the right  we are using Keystone and silver alginate. She is offloading the right heel with [name] surgical shoealthough by her admission she is on her feet quite [name] bit 1/3; patient presents for follow-up. Apligraf was placed to the wound beds last clinic visit. She was placed in [name] total contact cast to the left lower extremity. She declines [name] total contact cast today. She states that her mother is in the hospital and she cannot adequately get around with the cast on. 1/10; patient presents for follow-up. She declined the total contact cast at last clinic visit. Both wounds have declined in appearance. She states that she has been on her feet and not offloading the wound beds. She currently denies signs of infection. 1/17; patient presents for follow-up. She had the total contact cast along with Apligraf placed last week to the left lower extremity. She tolerated this well. She has been using Aquacel Ag and Keystone antibiotic to the right heel wound. She currently denies signs of infection. She has no issues or complaints today. [name], [name] ([id number]) [id number] Page 8 of 22 1/24; patient presents for follow-up. We have been using Apligraf to the left foot wound along with [name] total contact cast. She has done well with this. T the right o heel wound she has been using Aquacel Ag and Keystone antibiotic ointment. She has no issues or complaints today. She denies signs of infection. 1/31; patient presents for follow-up. We have been using Apligraf to the left foot wound along with the total contact cast. She continues to do well with this. To the right heel we have been using Aquacel Ag and Keystone antibiotic ointment. She has no issues or complaints today. 12-01-2022 upon evaluation patient is seen today  on my schedule due to the fact that she unfortunately was in the hospital yesterday. Her cast needed to come off the only reason she is out of the hospital is due to the fact that they would not take it off at the hospital which is somewhat bewildered reading to me to be perfectly honest. I am not certain why this was but either way she was released and then was placed on my schedule today in order to get this off and reapply the total contact casting as appropriate. I do not have an Apligraf for her today it was applied last week and today's actually expired yesterday as there was some scheduling conflicts with her being in the hospital. Nonetheless we do not have that for reapplication today but the good news that she is not draining too much and the Apligraf can go for up to 2 weeks so I am going to go ahead and reapply the total contact casting but we are going to leave the Apligraf in place. 2/14; patient presents for follow-up. T the left leg she has had the total contact cast and Apligraf for the past week. She has had no issues with this. T the o o right heel she has been using antibiotic ointment and Aquacel Ag. 2/21; patient presents for follow-up. We have been using Apligraf and [name] total contact cast to the left lower extremity. She is tolerated this well. Unfortunately she is not approved for any more Apligraf per insurance. She has been using antibiotic ointment and Aquacel Ag to the right foot. She has no issues or complaints today. 2/28; patient presents for follow-up. We have been using Hydrofera Blue and antibiotic ointment under the total contact cast to the left lower extremity. He has been  using Aquacel Ag with antibiotic ointment to the right plantar foot. She has no issues or complaints today. 3/6; patient presents for follow-up. She did not obtain her gentamicin ointment. She has been using Aquacel Ag to the right plantar foot wound. We have been using Hydrofera Blue with  antibiotic ointment under the total contact cast to the left lower extremity. She has no issues or complaints today. 3/12; patient presents for follow-up. She has been using gentamicin ointment and Aquacel Ag to the right plantar foot wound. We have been using Hydrofera Blue with antibiotic ointment under the total contact cast on the left lower extremity foot wound. She has no issues or complaints today. 3/20; patient presents for follow-up. She has been using gentamicin ointment and Aquacel Ag to the right plantar foot wound. We have been using Hydrofera Blue with antibiotic ointment under the total contact cast to the left lower extremity wound. She has no issues or complaints today. 3/27; patient presents for follow-up. We have been using antibiotic ointment and Aquacel Ag to the right plantar foot wound. We have been using Hydrofera Blue with antibiotic ointment under the total contact cast to the left lower extremity. Both wounds are smaller. 01-24-2023 upon evaluation today patient actually appears to be doing better compared to last time I saw her. It has been several weeks since I last saw her but nonetheless I think the total contact casting is doing [name] good job. Fortunately I do not see any evidence of infection or worsening overall which is great news and in general I do believe that she is moving in the appropriate direction. It has been [name] very long road but nonetheless I feel like she is nearing the end at least in regard to the left foot and even the right foot looks much better to be perfectly honest. 01-31-2023 upon evaluation today patient appears to be doing well currently in regard to her wounds. She has been require some sharp debridement which I think is probably bone both sides to remove [name] lot of callus that has built up at this point. I discussed that with her today. Also think total contact cast is doing well for the left foot Emina continue that as well. 4/17; patient presents for  follow-up. We have been doing Hydrofera Blue with antibiotic ointment under the total contact cast to the left lower extremity and Aquacel Ag with antibiotic ointment to the right foot wound. She has been using her offloading heel shoe here. 4/24; patient presents for follow-up. We have been doing Hydrofera Blue with antibiotic ointment under the total contact cast to the left lower extremity and Aquacel Ag with antibiotic ointment to the right foot wound. She has been using her offloading heel shoe here. Wounds are smaller with slough accumulation. 5/1; patient presents for follow-up. We have been doing Hydrofera Blue with antibiotic ointment under the total contact cast to the left lower extremity and Aquacel Ag with antibiotic ointment to the right foot wound. Left foot wound is stable and right foot wound appears smaller. 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 On the left she is using Hydrofera Blue topical antibiotic under [name] total contact cast, on the right plantar heel gentamicin Aquacel Ag. According to our intake nurse both are making slow but steady improvements. 5/15; patient presents for follow-up. We have been doing Hydrofera Blue with antibiotic ointment under the total contact cast on the left and antibiotic ointment with Aquacel Ag on the right. Unfortunately the left foot  wound has declined in size and appearance. We also do not have the total contact cast available in office today. Patient denies signs of infection. 5/22; patient presents for follow-up. We have been doing Hydrofera Blue and antibiotic ointment to the left foot and Aquacel Ag with antibiotic ointment to the right foot. We took [name] break from the cast since we did not have it placed at last week. Wounds look stable if not slightly improved. 03-27-2023 patient presents today she has had this For Most 2 Weeks Because She Was in the Hospital and They Did Not T It off. Again That Is Really Not ake Something That She Could  Control but Nonetheless We Are Seeing Her at This Point Today for Reevaluation. We Do Want to Go and See about Reapply the Cast. Again We Had All Set Everything up to Go Ahead and Do This When We Went to Get the Cast Only to Realize That Not Had Come in and We Were out at the Moment. With That Being [place] We Will Get Have T Get the Exam before We Can Reapply the T Contact Casting Going Forward. o otal 6/12; patient presents for follow-up. She has been using Hydrofera Blue to the left foot wound and Aquacel Ag to the right foot wound. No cast was available last week placed on the patient so she has been offloading with her surgical shoe. She has no issues or complaints today. 6/19; patient presents for follow-up. We have been using Hydrofera Blue to the left foot wound under the total contact cast and Aquacel Ag to the right wound. She has no issues or complaints today. The wounds are smaller. 6/26; patient presents for follow-up. We have been using Hydrofera Blue to the left foot wound under the total contact cast and Aquacel Ag to the right foot wound. Wounds are smaller. Patient has no issues or complaints today. 7/3; patient presents for follow-up. We have been using Hydrofera Blue to the left foot wound under the total contact cast and Aquacel Ag to the right foot wound. Wounds Appear smaller. No signs of infection. 7/10; patient presents for follow-up. We have been using Hydrofera Blue to the left foot wound under the total contact cast and Aquacel Ag to the right foot wound. Left foot wound is smaller. Right foot wound is stable. 7/17; patient presents for follow-up. We have been using Hydrofera Blue to the left foot under the total contact cast and Aquacel Ag to the right foot wound. She has no issues or complaints today. 7/24; patient presents for follow-up. We have been using Hydrofera Blue to the left foot under the total contact cast and Aquacel Ag to the right foot wound. Unfortunately  she was bit by her dog over the weekend and developed [name] wound to the right lower leg. She has been keeping the area covered. 7/31; patient presents for follow-up. We have been using Hydrofera Blue to the left foot under the total contact cast and Aquacel Ag to the right foot wound and mupirocin  ointment to the right anterior leg wound. She started her oral antibiotics 2 days ago. [name], [name] ([id number]) [id number] Page 9 of 22 8/7; patient presents for follow-up. We have been using Hydrofera Blue and antibiotic ointment to the left foot under the total contact cast and Aquacel Ag to the right foot wound. Antibiotic ointment to the right anterior leg wound. She completed her oral antibiotics. She has no issues or complaints today. Wounds continue to improve albeit slowly.  8/14; 8/14; patient has 3 wounds 1 on the left plantar heel 1 on the right plantar heel and [name] small wound on the right anterior lower leg. We have been using Hydrofera Blue in the left heel Aquacel on the right heel. The left heel has been in [name] total contact cast 8/21; patient presents for follow-up. We have been using Hydrofera Blue to the left foot wound under the total contact cast. T the right heel patient has been o using Aquacel Ag. T the anterior leg wound antibiotic ointment. All wounds have healed. o 8/28; patient presents for follow-up. She has been using her surgical shoe with offloading felt pads. Her wounds have remained closed. 9/11; patient presents for final follow-up to assure that her wounds have remained closed. Unfortunately the left foot wound has opened again. But she has been wearing her Surgical shoe for offloading with felt pad. 07-14-2023 upon evaluation today patient appears to be doing well currently in regard to her wound. She has been tolerating the dressing changes without complication with total contact casting. I am going to be helping take care of her at this point  as she is transferring from Dr. Rosan to me since Dr. Rosan is on maternity leave. 07-24-2023 upon evaluation today patient appears to be doing decently well currently in regard to her wound. She is actually showing signs of improvement in regard to the left foot and I feel like this is much better than last week no debridement even necessary today. We may have to perform some slight debridement come next week we will see how things go. Monitor for any signs of worsening overall and obviously if anything changes she should be letting us  know. 08-01-2023 upon evaluation today patient appears to be doing excellent in regard to her feet. The right foot/heel area is completely healed she does have some callus buildup I am going to trim this down [name] little bit I do not want to build up to the point of cracking she is doing well with the urea  cream. With regard to the left heel she is doing well with casting I am going to perform some debridement here to clear away before reapplying the cast and I think she is really doing quite well though. 08-07-2023 upon evaluation patient's wound actually showed signs of excellent improvement and actually very pleased with where we stand I do believe that she is making good headway towards complete closure which is great news. 08-14-2023 upon evaluation today patient appears to be doing well currently in regard to her wound. She actually is going require [name] little bit of debridement but fortunately nothing too significant today actually feel like she is doing much better. This is mainly to remove callus on the little slough and biofilm down to good subcutaneous tissue. In general I think that she is making excellent headway here. 08-21-2023 upon evaluation today patient appears to be doing well currently in regard to her wound which is actually showing signs of significant improvement. I am extremely pleased with where things stand I do not see any signs of infection  at this time which is great news and overall I do think that we are making headway towards complete closure which is also excellent news. 08-30-2023 upon evaluation today patient's wound is showing signs of continued improvement. Fortunately I do not see any evidence of worsening overall and I believe that the patient is making excellent headway towards closure. I am extremely pleased with where we stand. 09-04-2023  upon evaluation today patient appears to be doing well currently in regard to her foot ulcer. She has been tolerating the dressing changes without complication. Fortunately I do not see any signs of active infection locally or systemically at this time which is great news. No fevers, chills, nausea, vomiting, or diarrhea. 09-11-2023 upon evaluation today patient appears to be doing well currently in regard to her wound on the left foot. I feel like she is actually showing signs of improvement I feel like the collagen is doing [name] good job. With that being said unfortunately she has an area where there is [name] spot on the right foot that has not really open but at least there looks like there may have been something that popped off of this area. 09-18-2023 upon evaluation today patient appears to be doing well currently in regard to her wound although to be honest she still continues to have issues here with the wound not healing as rapidly as I would expect. She does [name] lot of walking on the cast from what I am hearing today and to be honest as I mention to her previously this cast is not exactly appropriate for her based on the size of her leg is technically too small and I think this is causing issues with rubbing and therefore callus buildup that would not otherwise occur with [name] properly fitting cast. I discussed that with her today and I think that potentially having [name] offloading shoe would be better specifically [name] front offloading boot. 09-25-2023 upon evaluation patient's wound is showing  signs of improvement which is great news and in general I do feel like that she is really doing quite well. I do not see any signs of active infection at this time which is excellent and in general I think that we are moving in the right direction here. She did get the offloading boot off of [employer] that working to get [name] try it is [name] front off loader which I think should do quite well and we will get [name] see how that does over the next week. 10-02-2023 upon evaluation today patient's wound actually showing signs of good improvement. I actually feel like it is overall looking better there is still quite [name] bit of callus but I did go ahead and perform debridement today to clear this away. She in general seems to be doing quite well. 10-09-2023 upon evaluation today patient unfortunately appears to be doing worse in regard to her wound she is showing signs of infection and this is not good. Fortunately I do not see any evidence of deeper wound but at the same time I do feel like there is some area underneath the callus where this has spread again we have previously gotten that healed. I think that she is probably going to need some oral antibiotic therapy. 10-17-2023 upon evaluation today patient unfortunately appears to be doing better with regard to the infection but has [name] lot more callus buildup. I really do not think that the boot is working out well for her. 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 Either way I do not believe at this point that this is going to be the appropriate way to continue currently. 12/31; patient seen in follow-up. 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 2 weeks ago she was felt to have an infection. [name] swab culture  for PCR showed significant copies of Pseudomonas, group [name] strep, anaerobic strep and Enterococcus faecalis. She was given [name] 2-week course of Cipro which she has now  finished. She was in [name] total contact cast and doing well but she has not had the cast on and almost [name] month now. We have been using Hydrofera Blue on the wound 11-06-2023 upon evaluation today patient appears to be doing well currently in regard to her wound from an infection standpoint I do not see anything that appears to be open. Unfortunately she continues to have [name] significant issue here with her wound. We are unable to get this really closed as we would like to see. 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 Electronic Signature(s) Signed: 11/06/2023 9:42:42 AM By: Heather Ferraris PA-C Entered By: Heather Ferraris on 11/06/2023 09:42:41 CECILIE QUAKER [name] ([id number]) [id number] Page 10 of 22 -------------------------------------------------------------------------------- Physical Exam Details Patient Name: Date of Service: KNOX, HOLDMAN 11/06/2023 9:00 [name]: [id number] Patient Account Number: [id number] Date of Birth/Sex: Treating RN: [birth year] (48 y.o. [name] Primary Care Provider: [name] Other Clinician: [name] Referring Provider: Treating Provider/Extender: Heather Ferraris [name] Devra in Treatment: 63 Constitutional Well-nourished and well-hydrated in no acute distress. Respiratory normal breathing without difficulty. Psychiatric this patient is able to make decisions and demonstrates good insight into disease process. Alert and Oriented x 3. pleasant and cooperative. Notes upon evaluation here patient's wound bed actually showed signs of poor granulation epithelization there was [name] tremendous amount of callus I did perform debridement to clear away the callus postdebridement wound bed appears to be improved. Electronic Signature(s) Signed: 11/06/2023 9:43:15 AM By: Heather Ferraris  PA-C Entered By: Heather Ferraris on 11/06/2023 09:43:15 -------------------------------------------------------------------------------- Physician Orders Details Patient Name: Date of Service: [name] 11/06/2023 9:00 [name]: [id number] Patient Account Number: [id number] Date of Birth/Sex: Treating RN: 1976/04/27 (47 y.o. 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 Bathing/ Shower/ Hygiene May shower with wound dressing protected with water repellent cover or cast protector. No tub bath. Anesthetic (Use 'Patient Medications' Section for Anesthetic Order Entry) Lidocaine  applied to wound bed - as needed Off-Loading Total Contact Cast to Left Lower Extremity - applied today 11/06/2023 Open toe surgical shoe - right foot Wound Treatment Wound #12R - Foot Wound Laterality: Plantar, Left, Medial Cleanser: Vashe 5.8 (oz) 1 x Per Week/30 Days Discharge Instructions: Use vashe 5.8 (oz) as directed Topical: Mupirocin  Ointment 1 x Per Week/30 Days Discharge Instructions: Apply as directed by provider. Prim Dressing: Hydrofera Blue Ready Transfer Foam, 2.5x2.5 (in/in) 1 x Per Week/30  Days ary Discharge Instructions: Apply Hydrofera Blue Ready to wound bed as directed Secondary Dressing: Gauze 1 x Per  Week/30 Days Discharge Instructions: As directed: dry, moistened with saline or moistened with Dakins Solution Secured With: Medipore T - 92M Medipore H Soft Cloth Surgical T ape ape, 2x2 (in/yd) 1 x Per Week/30 Days Secured With: [employer] or Non-Sterile 6-ply 4.5x4 (yd/yd) 1 x Per Week/30 Days Discharge Instructions: Apply Kerlix as directed Secured With: [employer], Latex-free, Size 5, Small-Head / Shoulder / Thigh 1 x Per Week/30 Days Electronic Signature(s) Signed: 11/06/2023 4:43:18 PM By: Heather Ferraris PA-C Signed: 11/06/2023 4:48:09 PM By: [name] Entered By: [name] on 11/06/2023 09:58:07 -------------------------------------------------------------------------------- Problem List Details Patient Name: Date of Service: [name] 11/06/2023 9:00 [name]: [id number] Patient Account Number: [id number] Date of Birth/Sex: Treating RN: 05-12-1976 (47 y.o. [name] Primary Care Provider: [name] Other Clinician: [name] Referring Provider: Treating Provider/Extender: Heather Ferraris [name] Weeks in Treatment: 91 Active Problems ICD-10 Encounter Code Description Active Date MDM Diagnosis E11.621 Type 2 diabetes mellitus with foot ulcer 03/16/2022 No Yes Sockwell, Mishael [name] ([id number]) [id number] Page 12 of 22 L97.528 Non-pressure chronic ulcer of other part of left foot with other specified 03/16/2022 No Yes severity E11.42 Type 2 diabetes mellitus with diabetic polyneuropathy 03/16/2022 No Yes Z79.4 Long term (current) use of insulin  02/22/2023 No Yes N18.6 End stage renal disease 07/14/2023 No Yes Z99.2 Dependence on renal dialysis 07/14/2023 No Yes L84 Corns and callosities 08/01/2023 No Yes Inactive Problems Resolved Problems ICD-10 Code Description Active  Date Resolved Date L97.512 Non-pressure chronic ulcer of other part of right foot with fat layer exposed 03/16/2022 03/16/2022 L97.811 Non-pressure chronic ulcer of other part of right lower leg limited to breakdown of skin 03/16/2022 03/16/2022 W54.0XXA Bitten by dog, initial encounter 05/17/2023 05/17/2023 S81.801A Unspecified open wound, right lower leg, initial encounter 05/17/2023 05/17/2023 Z88.377 Type 2 diabetes mellitus with other skin ulcer 05/17/2023 05/17/2023 Electronic Signature(s) Signed: 11/06/2023 9:13:56 AM By: Heather Ferraris PA-C Entered By: Heather Ferraris on 11/06/2023 09:13:56 -------------------------------------------------------------------------------- Progress Note Details Patient Name: Date of Service: [name] 11/06/2023 9:00 [name]: [id number] Patient Account Number: [id number] Date of Birth/Sex: Treating RN: 26-Nov-1975 (48 y.o. [name] Primary Care Provider: [name] Other Clinician: [name] Referring Provider: Treating Provider/Extender: Heather Ferraris [name] Weeks in Treatment: [address], Jayleen [name] ([id number]) [id number] Page 13 of 22 Subjective Chief Complaint Information obtained from Patient Left foot ulcer History of Present Illness (HPI) 01/18/18-She is here for initial evaluation of the left great toe ulcer. She is [name] poor historian in regards to timeframe in detail. She states approximately 4 weeks ago she lacerated her toe on something in the house. She followed up with her primary care who placed her on Bactrim and ultimately [name] second dose of Bactrim prior to coming to wound clinic. She states she has been treating the toe with peroxide, Betadine  and [name] Band-Aid. She did not check her blood sugar this morning but checked it yesterday morning it was 327; she is unaware of [name] recent A1c and there are no current records. She saw Dr. she would've orthopedics last week for an old injury to  the left ankle, she states he did not see her toe, nor did she bring it to his attention. She smokes approximately 1 pack cigarettes [name] day. Her social situation is concerning, she arrives this morning with her mother who appears extremely intoxicated/under the influence; her mother was asked to leave the room and be monitored by the  patient's grandmother. The patient's aunt then accompanied the patient and the room throughout the rest of the appointment. We had [name] lengthy discussion regarding the deleterious effects of uncontrolled hyperglycemia and smoking as it relates to wound healing and overall health. She was strongly encouraged to decrease her smoking and get her diabetes under better control. She states she is currently on [name] diet and has cut down her [facility] consumption. The left toe is erythematous, macerated and slightly edematous with malodor present. The edema in her left foot is below her baseline, there is no erythema streaking. We will treat her with Santyl, doxycycline ; we have ordered and xray, culture and provided [name] Peg assist surgical shoe and cultured the wound. 01/25/18-She is here in follow-up evaluation for [name] left great toe ulcer and presents with an abscess to her suprapubic area. She states her blood sugars remain elevated, feeling sick and if levels are below 250, but she is trying. She has made no attempt to decrease her smoking stating that we can't take away her food in her cigarettes. She has been compliant with offloading using the PEG assist you. She is using Santyl daily. the culture obtained last week grew staph aureus and Enterococcus faecalis; continues on the doxycycline  and Augmentin  was added on Monday. The suprapubic area has erythema, no femoral variation, purple discoloration, minimal induration, was accessed with [name] cotton tip applicator with sanguinopurulent drainage, this was cultured, I suspect the current antibiotic treatment will cover and we will not  add anything to her current treatment plan. She was advised to go to urgent care or ER with any change in redness, induration or fever. 02/01/18-She is here in follow-up evaluation for left great toe ulcers and [name] new abdominal abscess from last week. She was able to use packing until earlier this week, where she forgot it was there. She states she was feeling ill with GI symptoms last week and was not taking her antibiotic. She states her glucose levels have been predominantly less than 200, with occasional levels between 200-250. She thinks this was contributing to her GI symptoms as they have resolved without intervention. There continues to be significant laceration to left toe, otherwise it clinically looks stable/improved. There is now less superficial opening to the lateral aspect of the great toe that was residual blister. We will transition to Hydrofera Blue to all wounds, she will continue her Augmentin . If there is no change or deterioration next week for reculture. 02/08/18-She is here in follow-up evaluation for left great toe ulcer and abdominal ulcer. There is an improvement in both wounds. She has been wrapping her left toe with coban, not by our direction, which has created an area of discoloration to the medial aspect; she has been advised to NOT use coban secondary to her neuropathy. She states her glucose levels have been high over this last week ranging from 200-350, she continues to smoke. She admits to being less compliant with her offloading shoe. We will continue with same treatment plan and she will follow-up next week. 02/15/18-She is here in follow-up evaluation for left great toe ulcer and abdominal ulcer. The abdominal ulcer is epithelialized. The left great toe ulcer is improved and all injury from last week using the Coban wrap is resolved, the lateral ulcer is healed. She admits to noncompliance with wearing offloading shoe and admits to glucose levels being greater than  300 most of the week. She continues to smoke and expresses no desire to quit. There is one  area medially that probes deeper than it has historically, erythema to the toe and dorsal foot has consistently waxed and waned. There is no overt signs of cellulitis or infection but we will culture the wound for any occult infection given the new area of depth and erythema. We will hold off on sensitivities for initiation of antibiotic therapy. 02/22/18-She is here in follow up evaluation for left great toe ulcer. There is overall significant improvement in both wound appearance, erythema and edema with changes made last week. She was not initiated on antibiotic therapy. Culture obtained last week showed oxacillin sensitive staph aureus, sensitive to clindamycin . Clindamycin  has been called into the pharmacy but she has been instructed to hold off on initiation secondary to overall clinical improvement and her history of antibiotic intolerance. She has been instructed to contact the clinic with any noted changes/deterioration and the wound, erythema, edema and/or pain. She will follow-up next week. She continues to smoke and her glucose levels remain elevated >250; she admits to compliance with offloading shoe 03/01/18 on evaluation today patient appears to be doing fairly well in regard to her left first toe ulcer. She has been tolerating the dressing changes with the Hydrofera Blue Dressing without complication and overall this has definitely showed signs of improvement according to records as well is what the patient tells me today. I'm very pleased in that regard. She is having no pain today 03/08/18 She is here for follow up evaluation of [name] left great toe ulcer. She remains non-compliant with glucose control and smoking cessation; glucose levels consistently >200. She states that she got new shoe inserts/peg assist. She admits to compliance with offloading. Since my last evaluation there is  significant improvement. We will switch to prisma at this time and she will follow up next week. She is noted to be tachycardic at this appointment, heart rate 120s; she has [name] history of heart rate 70-130 according to our records. She admits to extreme agitation r/t personal issues; she was advised to monitor her heartrate and contact her physician if it does not return to [name] more normal range (<100). She takes cardizem twice daily. 03/15/18-She is here in follow-up evaluation for left great toe ulcer. She remains noncompliant with glucose control and smoking cessation. She admits to compliance with wearing offloading shoe. The ulcer is improved/stable and we will continue with the same treatment plan and she will follow-up next week 03/22/18-She is here for evaluation for left great toe ulcer. There continues to be significant improvement despite recurrent hyperglycemia (over 500 yesterday) and she continues to smoke. She has been compliant with offloading and we will continue with same treatment plan and she will follow-up next week. 03/29/18-She is here for evaluation for left great toe ulcer. Despite continuing to smoke and uncontrolled diabetes she continues to improve. She is compliant with offloading shoe. We will continue with the same treatment plan and she will follow-up next week 04/05/18- She is here in follow up evaluation for [name] left great toe ulcer; she presents with small pustule to left fifth toe (resembles ant bite). She admits to compliance with wearing offloading shoe; continues to smoke or have uncontrolled blood glucose control. There is more callus than usual with evidence of bleeding; she denies known trauma. 04/12/18-She is here for evaluation of left great toe ulcer. Despite noncompliance with glycemic control and smoking she continues to make improvement. She continues to wear offloading shoe. The pustule, that was identified last week, to the left fifth toe  is resolved. She will  follow-up in 2 weeks 05/03/18-she is seen in follow-up evaluation for [name] left great toe ulcer. She is compliant with offloading, otherwise noncompliant with glycemic control and smoking. She has plateaued and there is minimal improvement noted. We will transition to Hydrofera Blue, replaced the insert to her surgical shoe and she will follow-up in one week 05/10/18- She is here in follow up evaluation for [name] left great toe ulcer. It appears stable despite measurement change. We will continue with same treatment plan and follow up next week. 05/24/18-She is seen in follow-up evaluation for [name] left great toe ulcer. She remains compliant with offloading, has made significant improvement in her diet, decreasing the amount of sugar/soda. She said her recent A1c was 10.9 which is lower than. She did see [name] diabetic nutritionist/educator yesterday. She continues to smoke. We will continue with the same treatment plan and she'll follow-up next week. 05/31/18- She is seen in follow-up evaluation for left great toe ulcer. She continues to remain compliant with offloading, continues to make improvement in her diet, increasing her water and decreasing the amount of sugar/soda. She does continue to smoke with no desire to quit. We will apply Prisma to the depth and Hydrofera Blue over. We have not received insurance authorization for oasis. She will follow up next week. 06/07/18-She is seen in follow-up evaluation for left great toe ulcer. It has stalled according to today's measurements although base appears stable. She says she saw [name] diabetic educator yesterday; her average blood sugars are less than 300 which is an improvement for her. She continues to smoke and states that's my next step She continues with water over soda. We will order for xray, culture and reinstate ace wrap compression prior to placing apligraf for next week. She is voicing no complaints or concerns. Her dressing will change to iodoflex over the  next week in preparation for apligraf. 06/14/18-She is seen in follow-up evaluation for left great toe ulcer. Plain film x-ray performed last week was negative for osteomyelitis. Wound culture obtained last week grew strep B and OSSA; she is initiated on keflex  and cefdinir  today; there is erythema to the toe which could be from ace wrap compression, she has [name] history of wrapping too tight and has has been encouraged to maintain ace wraps that we place today. We will hold off on application of apligraf today, will apply next week after antibiotic therapy has been initiated. She admits today that she has resumed taking [name] shower with her foot/toe submerged in water, she has been reminded to keep foot/toe out of the bath water. She will be seen in follow up next week 06/21/18-she is seen in follow-up evaluation for left great toe ulcer. She is tolerating antibiotic therapy with no GI disturbance. The wound is stable. Apligraf was applied today. She has been decreasing her smoking, only had 4 cigarettes yesterday and 1 today. She continues being more compliant in diabetic diet. She NEERA, TENG [name] ([id number]) [id number] Page 14 of 22 will follow-up next week for evaluation of site, if stable will remove at 2 weeks. 06/28/18- She is here in follow up evalution. Apligraf was placed last week, she states the dressing fell off on Tuesday and she was dressing with hydrofera blue. She is healed and will be discharged from the clinic today. She has been instructed to continue with smoking cessation, continue monitoring glucose levels, offloading for an additional 4 weeks and continue with hydrofera blue for additional two weeks for  any possible microscopic opening. Readmission: 08/07/18 on evaluation today patient presents for reevaluation concerning the ulcer of her right great toe. She was previously discharged on 06/28/18 healed. Nonetheless she states that this began to show signs of  drainage she subsequently went to her primary care provider. Subsequently an x-ray was performed on 08/01/18 which was negative. The patient was also placed on antibiotics at that time. Fortunately they should have been effective for the infection. Nonetheless she's been experiencing some improvement but still has [name] lot of drainage coming from the wound itself. 08/14/18 on evaluation today patient's wound actually does show signs of improvement in regard to the erythema at this point. She has completed the antibiotics. With that being said we did discuss the possibility of placing her in [name] total contact cast as of today although I think that I may want to give this just [name] little bit more time to ensure nothing recurrence as far as her infection is concerned. I do not want to put in the cast and risk infection at that time if things are not completely resolved. With that being said she is gonna require some debridement today. 08/21/18 on evaluation today patient actually appears to be doing okay in regard to her toe ulcer. She's been tolerating the dressing changes without complication. With that being said it does appear that she is ready and in fact I think it's appropriate for us  to go ahead and initiate the total contact cast today. Nonetheless she will require some sharp debridement to prepare the wound for application. Overall I feel like things have been progressing well but we do need to do something to get this to close more readily. 08/24/18 patient seen today for reevaluation after having had the total contact cast applied on Tuesday. She seems to have done very well the wound appears to be doing great and overall I'm pleased with the progress that she's made. There were no abnormal areas of rubbing from the cast on her lower extremity. 08/30/18 on evaluation today patient actually appears to be completely healed in regard to her plantar toe ulcer. She tells me at this point she's been having [name]  lot of issues with the cast. She almost fell [name] couple of times the state shall the step of her dog [name] couple times as well. This is been [name] very frustrating process for her other nonetheless she has completely healed the wound which is excellent news. Overall there does not appear to be the evidence of infection at this time which is great news. 09/11/18 evaluation today patient presents for follow-up concerning her great toe ulcer on the left which has unfortunately reopened since I last saw her which was only [name] couple of weeks ago. Unfortunately she was not able to get in to get the shoe and potentially the AFO that's gonna be necessary due to her left foot drop. She continues with offloading shoe but this is not enough to prevent her from reopening it appears. When we last had her in the total contact cast she did well from [name] healing standpoint but unfortunately the wound reopened as soon as she came out of the cast within just [name] couple of weeks. Right now the biggest concern is that I do believe the foot drop is leading to the issue and this is gonna continue to be an issue unfortunately until we get things under control as far as the walking anomaly is concerned with the foot drop. This is also part  of the reason why she falls on [name] regular basis. I just do not believe that is gonna be safe for us  to reinitiate the total contact cast as last time we had this on she fell 3 times one week which is definitely not normal for her. 09/18/18 upon evaluation today the patient actually appears to be doing about the same in regard to her toe ulcer. She did not contact [facility] as I asked her to even though I had given her the prescription. In fact she actually states that she has no idea where the prescription is. She did apparently call [facility] and they told her that all she needed to do was bring the prescription in order to be able to be seen and work on getting the AFO for her left foot. With all that  being said she still does not have an appointment and I'm not sure were things stand that regard. I will give her [name] new prescription today in order to contact them to get this set up. 09/25/18 on evaluation today patient actually appears to be doing about the same in regard to her toes ulcer. She does have [name] small areas which seems to have [name] lot of callous buildup around the edge of the wound which is going to need sharp debridement today. She still is waiting to be scheduled for evaluation with [facility] for possibility of an AFO. She states there supposed to call her tomorrow to get this set up. Unfortunately it does appear that her foot specifically the toe area is showing signs of erythema. There does not appear to be any systemic infection which is in these good news. 10/02/18 on evaluation today patient actually appears to be doing about the same in regard to her toe ulcer. This really has not done too well although it's not significantly larger it's also not significantly smaller. She has been tolerating the dressing changes without complication. She actually has her appointment with [facility] and [place] tomorrow to hopefully be measured for obtaining and AFO splint. I think this would be helpful preventing this from reoccurring. We had contemplated starting the cast this week although to be honest I am reluctant to do that as she's been having nausea, vomiting, and seizure activity over the past three days. She has [name] history of seizures and have been told is nothing that can be done for these. With that being said I do believe that along with the seizures have the nausea vomiting which upon further questioning doesn't seem to be the normal for her and makes me concerned for the possibility of infection or something else going on. I discussed this with the patient and her mother during the office visit today. I do not feel the wound is effective but maybe something else. The responses this was  this just happens to her at times and we don't know why. They did not seem to be interested in going to the hospital to have this checked out further. 10/09/18 on evaluation today patient presents for follow-up concerning her ongoing toe ulcer. She has been tolerating the dressing changes without complication. Fortunately there does not appear to be any evidence of infection which is great news however I do think that the patient would benefit from going ahead for with the total contact cast. She's actually in [name] wheelchair today she tells me that she will use her walker if we initiate the cast. I was very specific about the fact that if we were gonna do  the cast I wanted to make sure that she was using the walker in order to prevent any falls. She tells me she does not have stairs that she has to traverse on [name] regular basis at her home. She has not had any seizures since last week again that something that happens to her often she tells me she did talk to [employer] and they said that it may take up to three weeks to get the brace approved for her. Hopefully that will not take that long but nonetheless in the meantime I do think the cast could be of benefit. 10/12/18 on evaluation today patient appears to be doing rather well in regard to her toe ulcer. It's just been [name] few days and already this is significantly improved both as far as overall appearance and size. Fortunately there's no sign of infection. She is here for her first obligatory cast change. 10/19/18 Seen today for follow up and management of left great toe ulcer. Wound continues to show improvement. Noted small open area with seroussang drainage with palpation. Denies any increased pain or recent fevers during visit. She will continue calcium  alginate with offloading shoe. Denies any questions or concerns during visit. 10/26/18 on evaluation today patient appears to be doing about the same as when I last saw her in regard to her wound bed.  Fortunately there does not appear to be any signs of infection. Unfortunately she continues to have [name] breakdown in regard to the toe region any time that she is not in the cast. It takes almost no time at all for this to happen. Nonetheless she still has not heard anything from the brace being made by [employer] as to when exactly this will be available to her. Fortunately there is no signs of infection at this time. 10/30/18 on evaluation today patient presents for application of the total contact cast as we just received him this morning. Fortunately we are gonna be able to apply this to her today which is great news. She continues to have no significant pain which is good news. Overall I do feel like things have been improving while she was the cast is when she doesn't have [name] cast that things get worse. She still has not really heard anything from [employer] regarding her brace. 11/02/18 upon evaluation today patient's wound already appears to be doing significantly better which is good news. Fortunately there does not appear to be any signs of infection also good news. Overall I do think the total contact cast as before is helping to heal this area unfortunately it's just not gonna likely keep the area closed and healed without her getting her brace at least. Again the foot drop is [name] significant issue for her. 11/09/18 on evaluation today patient appears to be doing excellent in regard to her toe ulcer which in fact is completely healed. Fortunately we finally got the situation squared away with the paperwork which was needed to proceed with getting her brace approved by Medicaid. I have filled that out unfortunately that information has been sent to the orthopedic office that I worked at 2 1/2 years ago and not tired Current wound care measures. Fortunately she seems to be doing very well at this time. ANNSLEY, AKKERMAN [name] ([id number]) [id number] Page 15 of 22 11/23/18 on evaluation  today patient appears to be doing More Poorly Compared to Last Time I Saw Her. At [facility] She Had Completely Healed. Currently she is continuing to have issues with reopening. She  states that she just found out that the brace was approved through Medicaid now she just has to go get measured in order to have this fitted for her and then made. Subsequently she does not have an appointment for this yet that is going to complicate things we obviously cannot put her back in the cast if we do not have everything measured because they're not gonna be able to measure her foot while she is in the cast. 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 Obviously this is unfortunate and does have me somewhat worried as well. 11/30/18 on evaluation today patient's toe ulcer actually appears to be doing fairly well. The good news is she will be getting her brace in the shoes next week on Wednesday. Hopefully we will be able to get this to heal without having to go back in the cast however she may need the cast in order to get the wound completely heal and then go from there. Fortunately there's no signs of infection at this time. 12/07/18 on evaluation today patient fortunately did receive her brace and she states she could tell this definitely makes her walk better. With that being said she's been having issues with her toe where she noticed yesterday there was [name] lot of tissue that was loosing off this appears to be much larger than what it was previous. She also states that her leg has been read putting much across the top of her foot just about the ankle although this seems to be receiving somewhat. The total area is still red and appears to be someone infected as best I can tell. She is previously taken Bactrim and that may be [name] good option for her today as well. We are gonna see what I wound culture shows as well and I  think that this is definitely appropriate. With that being said outside of the culture I still need to initiate something in the interim and that's what I'm gonna go ahead and select Bactrim is [name] good option for her. 12/14/18 on evaluation today patient appears to be doing better in regard to her left great toe ulcer as compared to last week's evaluation. There's still some erythema although this is significantly improved which is excellent news. Overall I do believe that she is making good progress is still gonna take some time before she is where I would like her to be from the standpoint of being able to place her back into the total contact cast. Hopefully we will be where we need to be by next week. 12/21/18 on evaluation today patient actually appears to be doing poorly in regard to her toe ulcer. She's been tolerating the dressing changes without complication. Fortunately there's no signs of systemic infection although she does have [name] lot of drainage from the toe ulcer and this does seem to be causing some issues at this point. She does have erythema on the distal portion of her toe that appears to be likely cellulitis. 12/28/18 on evaluation today patient actually appears to be doing [name] little better in my pinion in regard to her toe ulcer. With that being said she still does have some evidence of infection at this time and for her culture she had both E. coli as well as enterococcus as organisms noted on evaluation. For that reason I think that though the Keflex  likely has treated the  E. coli rather well this has really done nothing for the enterococcus. We are going to have to initiate treatment for this specifically. 01/04/19 on evaluation today patient's toe actually appears to be doing better from the standpoint of infection. She currently would like to see about putting the cash back on I think that this is appropriate as long as she takes care of it and keeps it from getting wet. She is gonna  have some drainage we can definitely pass this up with Drawtex and alginate to try to prevent as much drainage as possible from causing the problems. With that being said I do want to at least try her with the cast between now and Tuesday. If there any issues we can't continue to use it then I will discontinue the use of the cast at that point. 01/08/19 on evaluation today patient actually appears to be doing very well as far as her foot ulcer specifically the great toe on the left is concerned. She did have an area of rubbing on the medial aspect of her left ankle which again is from the cast. Fortunately there's no signs of infection at this point in this appears to be [name] very slight skin breakdown. The patient tells me she felt it rubbing but didn't think it was that bad. Fortunately there is no signs of active infection at this time which is good news. No fevers, chills, nausea, or vomiting noted at this time. 01/15/19 on evaluation today patient actually appears to be doing well in regard to her toe ulcer. Again as previous she seems to do well and she has the cast on which indicates to me that during the time she doesn't have [name] cast on she's putting way too much pressure on this region. Obviously I think that's gonna be an issue as with the current national emergency concerning the Covid-19 Virus it has been recommended that we discontinue the use of total contact casting by the chief medical officer of our company, Dr. Marinda. The reasoning is that if [name] patient becomes sick and cannot come into have the cast removed they could not just leave this on for an additional two weeks. Obviously the hospitals also do not want to receive patient's who are sick into the emergency department to potentially contaminate the region and spread the Covid-19 Virus among other sick individuals within the hospital system. Therefore at this point we are suspending the use of total contact cast until the current  emergency subsides. This was all discussed with the patient today as well. 01/22/19 on evaluation today patient's wound on her left great toe appears to be doing slightly worse than previously noted last week. She tells me that she has been on this quite [name] bit in fact she tells me she's been awake for 38 straight hours. This is due to the fact that she's having to care for grandparents because nobody else will. She has been taking care of them for five the last seven days since I've seen her they both have dementia his is from [name] stroke and her grandmother's was progressive. Nonetheless she states even her mom who knows her condition and situation has only help two of those days to take care of them she's been taking care of the rest. Fortunately there does not appear to be any signs of active infection in regard to her toe at this point although obviously it doesn't look as good as it did previous. I think this is directly related  to her not taking off the pressure and friction by way of taking things easy. Though I completely understand what's going on. 01/29/19 on evaluation today patient's tools are actually appears to be showing some signs of improvement today compared to last week's evaluation as far as not necessarily the overall size of the wound but the fact that she has some new skin growth in between the two ends of the wound opening. Overall I feel like she has done well she states that she had [name] family member give her what sounds to be [name] CAM walker boot which has been helpful as well. 02/05/19 on evaluation today patient's wound bed actually appears to be doing significantly better in regard to her overall appearance of the size of the wound. With that being said she is still having an issue with offloading efficiently enough to get this to close. Apparently there is some signs of infection at this point as well unfortunately. Previously she's done well of Augmentin  I really do not see anything  that needs to be culture currently but there theme and cellulitis of the foot that I'm seeing I'm gonna go ahead and place her on an antibiotic today to try to help clear this up. 02/12/2019 on evaluation today patient actually appears to be doing poorly in regard to her overall wound status. She tells me she has been using her offloading shoe but actually comes in today wearing her tennis shoe with the AFO brace. Again as I previously discussed with her this is really not sufficient to allow the area to heal appropriately. Nonetheless she continues to be somewhat noncompliant and I do wonder based on what she has told my nurse in the past as to whether or not [name] good portion of this noncompliance may be recreational drug and alcohol related. She has had [name] history of heroin overdose and this was fairly recently in the past couple of months that have been seeing her. Nonetheless overall I feel like her wound looks significantly worse today compared to what it was previous. She still has significant erythema despite the Augmentin  I am not sure that this is an appropriate medication for her infection I am also concerned that the infection may have gone down into her bone. 02/19/19 on evaluation today patient actually appears to be doing about the same in regard to her toe ulcer. Unfortunately she continues to show signs of bone exposure and infection at this point. There does not appear to be any evidence of worsening of the infection but I'm also not really sure that it's getting significantly better. She is on the Augmentin  which should be sufficient for the Staphylococcus aureus infection that she has at this point. With that being said she may need IV antibiotics to more appropriately treat this. We did have [name] discussion today about hyperbaric option therapy. 02/28/19 on evaluation today patient actually appears to be doing much worse in regard to the wound on her left great toe as compared to even my  previous evaluation last week. Unfortunately this seems to be training in [name] pretty poor direction. Her toe was actually now starting to angle laterally and I can actually see the entire joint area of the proximal portion of the digit where is the distal portion of the digit again is no longer even in contact with the joint line. Unfortunately there's [name] lot more necrotic tissue around the edge and the toe appears to be showing signs of becoming gangrenous in my pinion.  I'm very concerned about were things stand at this point. She did see infectious disease and they are planning to send in [name] prescription for Sivextro  for her and apparently this has been approved. With that being said I don't think she should avoid taking this but at the same time I'm not sure that it's gonna be sufficient to save her toe at this point. She tells me that she still having to care for grandparents which I think is putting quite [name] bit of strain on her foot and specifically the total area and has caused this to break down even to [name] greater degree than would've otherwise been expected. 03/05/19 on evaluation today patient actually appears to be doing quite well in regard to her toe all things considering. She still has bone exposed but there appears to be much less your thing on overall the appearance of the wound and the toe itself is dramatically improved. She still does have some issues [name], [name] ([id number]) [id number] Page 16 of 22 currently obviously with infection she did see vascular as well and there concerned that her blood flow to the toad. For that reason they are setting up for an angiogram next week. 03/14/19 on evaluation today patient appears to be doing very poor in regard to her toe and specifically in regard to the ulceration and the fact that she's starting to notice the toe was leaning even more towards the lateral aspect and the complete joint is visible on the proximal  aspect of the joint. Nonetheless she's also noted [name] significant odor and the tip of the toe is turning more dark and necrotic appearing. Overall I think she is getting worse not better as far as this is concerned. For that reason I am recommending at this point that she likely needs to be seen for likely amputation. READMISSION 03/19/2021 This is [name] patient that we cared for in this clinic for [name] prolonged period of time in 2019 and 2020 with [name] left foot and left first toe wound. 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 Lennie. In December 2021 she had an ulcer on her right great toe as well as the fourth and fifth toes. 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 She also had an angiogram at that time and underwent angioplasty of the right anterior tibial artery. In any case she claims that the wound on the right foot is closed I did not look at this today which was probably an oversight although I think that should be done next week. 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 According to Dr. Honore last review that she was out of the area being cared for by another physician but recently came back to his attention. 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 [name] culture of this area showed E. coli apparently before she came back to see Dr. Lennie she was supposed to be receiving antibiotics but she did not really take them. Nor is she offloading this area at all. Finally her last hemoglobin A1c listed in epic was in March 2022 at 14.1 she says things are [name] lot better since then although I am not sure. 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 She had  this wound at the time. During this admission she had E. coli  sepsis [name] TEE was negative. 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 She is still [name] heavy cigarette smoker. She has [name] history of bipolar disorder seizure disorder ABI in our clinic was 1.05 6/1; left midfoot in the setting of [name] TMA done previously. Round circular wound with [name] knuckle of protruding tissue. The problem is that the knuckle was not attached to any of the surrounding granulation and this probed proximally widely I removed [name] large portion of this tissue. This wound goes with considerable undermining laterally. I do not feel any bone there was no purulence but this is [name] deep wound. 6/8; in spite of the debridement I did last week. She arrives with [name] wound looking exactly the same. [name] protruding knuckle of tissue nonadherent to most of the surrounding tissue. There is considerable depth around this from 6-12 o'clock at 2.7 cm and undermining of 1 cm. This does not look overtly infected and the x- ray I did last week was negative for any osseous abnormalities. We have been using silver collagen 6/15; deep tissue culture I did last week showed moderate staph aureus and moderate Pseudomonas. This will definitely require prolonged antibiotic therapy. The pathology on the protuberant area was negative for malignancy fungus etc. 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 We have been using silver collagen. I am going to be prescribing Levaquin for [address] scan of the foot is down for 7/5 6/22; CT scan of the foot on 7 5. She says she has hardware in the left leg from her previous fracture. She is on the Levaquin for the deep tissue culture I did that showed methicillin  sensitive staph aureus and Pseudomonas. I gave her [name] 2-week supply and she will have another week. She arrives in clinic today with the same protuberant tissue however this is nonadherent to the tissue surrounding it. I am really at [name] loss to explain this unless there is underlying deep tissue infection 6/29; patient presents for 1 week follow-up. She has been using collagen to the wound bed. She reports taking her antibiotics as prescribed.She has no complaints or issues today. She denies signs of infection. 7/6; patient presents for one week followup. She has been using collagen to the wound bed. She states she is taking Levaquin however at times she is not able to keep it down. She denies signs of infection. 7/13; patient presents for 1 week follow-up. She has been using silver alginate to the wound bed. She still has nausea when taking her antibiotics. She denies signs of infection. 7/20; patient presents for 1 week follow-up. She has been using silver alginate with gentamicin cream to the wound bed. She denies any issues and has no complaints today. She denies signs of infection. 7/27; patient presents for 1 week follow-up. She continues to use silver alginate with gentamicin cream to the wound bed. She reports starting her antibiotics. She has no issues or complaints. Overall she reports stability to the wound. 8/3; patient presents for 1 week follow-up. She has been using silver alginate with gentamicin cream to the wound bed. She reports completing all antibiotics. She has no issues or complaints today. She  denies signs of infection. 8/17; patient presents for 2-week follow-up. He is to use silver alginate to the wound bed. She has no issues or complaints today. She denies signs of infection. She reports her pain has improved in her foot since last clinic visit 8/24; patient presents for 1 week follow-up. She continues to use silver alginate to the wound bed. She has no issues or complaints.  She denies signs of infection. Pain is stable. 9/7; patient presents for follow-up. She missed her last week appointment due to feeling ill. She continues to use silver alginate. She has [name] new wound to the right lower extremity that is covered in eschar. She states It occurred over the past week and has no idea how it started. She currently denies signs of infection. 9/14; patient presents for follow-up. T the left foot wound she has been using gentamicin cream and silver alginate. T the right lower extremity wound she has o o been keeping this covered and has not obtain Santyl. 9/21; patient presents for follow-up. She reports using gentamicin cream and silver alginate to the left foot and Santyl to the right lower extremity wound. She has no issues or complaints today. She denies signs of infection. 9/28; patient presents for follow-up. She reports [name] new wound to her right heel. She states this occurred [name] few days ago and is progressively gotten worse. She has been trying to clean the area with [name] Q-tip and Santyl. She reports stability in the other 2 wounds. She has been using gentamicin cream and silver alginate to the left foot and Santyl to the right lower extremity wound. 10/12; patient presents for follow-up. She reports improvement to the wound beds. She is seeing vein and vascular to discuss the potential of [name] left BKA. She states they are going to do an arteriogram. She continues to use silver alginate with dressing changes to her wounds. 11/2; patient presents for follow-up. She states she has not been doing dressing changes to the wound beds. She states she is not able to offload the areas. She reports chronic pain to her left foot wound. 11/9; patient presents for follow-up. She came in with only socks on. She states she forgot to put on shoes. It is unclear if she is doing any dressing changes. She currently denies systemic signs of infection. 11/16; patient presents for follow-up.  She came again only with socks on. She states she does not wear shoes ever. It is unclear if she does dressing changes. She currently denies systemic signs of infection. 11/23; patient presents for follow-up. She wore her shoes today. It still unclear exactly what dressing she is using for each wound but she did states she obtained Dakin's solution and has been using this to the left foot wound. She currently denies signs of infection. 11/30; patient presents for follow-up. She has no issues or complaints today. She currently denies signs of infection. 12/7; patient presents for follow-up. She has no issues or complaints today. She has been using Hydrofera Blue to the right heel wound and Dakin solution to the left foot wound. Her right anterior leg wound is healed. She currently denies signs of infection. 12/14; patient presents for follow-up. She has been using Hydrofera Blue to the right heel and Dakin's to the left foot wounds. She has no issues or complaints CARMEL, GARFIELD [name] ([id number]) [id number].pdf Page 17 of 22 today. She denies signs of infection. 12/21; patient presents for follow-up. She reports using Hydrofera Blue to the right heel  and Dakin's to the left foot wound. She denies signs of infection. 12/28; patient presents for follow-up. She continues to use Dakin's to the left foot wound and Hydrofera Blue to the right heel wound. She denies signs of infection. 1/4; patient presents for follow-up. She has no issues or complaints today. She denies signs of infection. 1/11; patient presents for follow-up. It is unclear if she has been dressing these wounds over the past week. She currently denies signs of infection. 1/18; patient presents for follow-up. She states she has been using Dakin's wet-to-dry dressings to the left foot. She has been using Hydrofera Blue to the right foot foot wound. She states that the anterior right leg wound has reopened and draining  serous fluid. She denies signs of infection. 1/25; patient presents for follow-up. She has no issues or complaints today. 2/1; patient presents for follow-up. She has no issues or complaints today. She denies signs of infection. 2/8; patient presents for follow-up. She has lost her surgical shoes. She did not have [name] dressing to the right heel wound. She currently denies signs of infection. 2/15; patient presents for follow-up. She reports more pain to the right heel today. She denies purulent drainage Or fever/chills 2/22; patient presents for follow-up. She reports taking clindamycin  over the past week. She states that she continues to have pain to her right heel. She reports purulent drainage. Readmission 03/16/2022 Ms. Michaelene Golaszewski is [name] 48 year old female with [name] past medical history of type 2 diabetes, osteomyelitis to her feet, chronic systolic heart failure and bipolar disorder that presents to the clinic for bilateral feet wounds and right lower extremity wound. She was last seen in our clinic on 12/15/2021. At that time she had purulent drainage coming out of her right plantar foot and I recommended she go to the ED. She states she went to [facility] and has been there for the past 3 months. I cannot see the records. She states she had OR debridement and was on several weeks of IV antibiotics while inpatient. Since discharge she has not been taking care of the wound beds. She had nothing on her feet other than socks today. She currently denies signs of infection. 5/31; patient presents for follow-up. She has been using Dakin's wet-to-dry dressings to the wound beds on her feet bilaterally and antibiotic ointment to the right anterior leg wound. She had [name] wound culture done at last clinic visit that showed moderate Pseudomonas aeruginosa sensitive to ciprofloxacin. She currently denies systemic signs of infection. 6/14; patient presents for follow-up. She received Keystone 5 days  ago and has been using this on the wound beds. She states that last week she had to go to the hospital because she had increased warmth and erythema to the right foot. She was started on 2 oral antibiotics. She states she has been taking these. She currently denies systemic signs of infection. She has no issues or complaints today. 6/21; patient presents for follow-up. She states she has been using Keystone antibiotics to the wound beds. She has no issues or complaints today. She denies signs of infection. 6/28; patient presents for follow-up. She has been using Keystone antibiotics to the wound beds. She has no issues or complaints today. 7/12; patient presents for follow-up. Has been using Keystone antibiotics to the wound beds with calcium  alginate. She has no issues or complaints today. She never followed up with her orthopedic surgeon who did the OR debridement to the right foot. We discussed the total  contact cast for the left foot and patient would like to do this next week. 7/19; patient presents for follow-up. She has been using Keystone antibiotics with calcium  alginate to the wound beds. She has no issues or complaints today. Patient is in agreement to do the total contact cast of the left foot today. She knows to return later this week for the obligatory cast change. 05-13-2022 upon evaluation today patient's wound which she has the cast of the left leg actually appears to be doing significantly better. Fortunately I do not see any signs of active infection locally or systemically which is great news and overall I am extremely pleased with where we stand currently. 7/26; patient presents for follow-up. She has [name] cast in place for the past week. She states it irritated her shin. Other than that she tolerated the cast well. She states she would like [name] break for 1 week from the cast. We have been using Keystone antibiotic and Aquacel to both wound beds. She denies signs of infection. 8/2;  patient presents for follow-up. She has been using Keystone and Aquacel to the wound beds. She denies any issues and has no complaints. She is agreeable to have the cast placed today for the left leg. 06-03-2022 upon evaluation today patient appears to be doing well with regard to her wound she saw some signs of improvement which is great news. Fortunately I do not see any evidence of active infection locally or systemically at this time which is great news. No fevers, chills, nausea, vomiting, or diarrhea. 8/16; patient presents for follow-up. She has no issues or complaints today. We have been using Keystone and Aquacel to the wound beds. The left lower extremity is in [name] total contact cast. She is tolerated this well. 8/23; patient presents for follow-up. She has had the total contact cast on the left leg for the past week. Unfortunately this has rubbed and broken down the skin to the medial foot. She currently denies signs of infection. She has been using Keystone antibiotic to the right plantar foot wound. 8/30; patient presents for follow-up. We have held off on the total contact cast for the left leg for the past week. Her wound on the left foot has improved and the previous surrounding breakdown of skin has epithelialized. She has been using Keystone antibiotic to both wound beds. She has no issues or complaints today. She denies signs of infection. 9/6; patient presents for follow-up. She has ordered her's Keystone antibiotic and this is arriving this week. She has been doing Dakin's wet-to-dry dressings to the wound beds. She denies signs of infection. She is agreeable to the total contact cast today. 9/13; patient presents for follow-up. She states that the cast caused her left leg shin to be sore. She would like to take [name] break from the cast this week. She has been using Keystone antibiotic to the right plantar foot wound. She denies signs of infection. 9/20; patient presents for follow-up.  She has been using Keystone antibiotics to the wound beds with calcium  alginate to the right foot wound and Hydrofera Blue to the left foot wound. She is agreeable to having the cast placed today. She has been approved for Apligraf and we will order this for next clinic visit. 9/27; patient presents for follow-up. We have been using Keystone antibiotic with Hydrofera Blue to the left foot wound under [name] total contact cast. T the right o foot wound she has been using Keystone antibiotic and calcium  alginate.  She declines [name] total contact cast today. Apligraf is available for placement and she would like to proceed with this. 07-28-2022 upon evaluation today patient appears to be doing well currently in regard to her wound. She is actually showing signs of significant improvement which is great news. Fortunately I do not see any evidence of active infection locally nor systemically at this time. She has been seeing Dr. Rosan and to [name], [name] [name] ([id number]) [id number] Page 18 of 22 be honest has been doing very well with the cast. Subsequently she comes in today with [name] cast on and we did reapply that today as well. She did not really want to she try to talk me out of that but I explained that if she wanted to heal this is really the right way to go. Patient voiced understanding. In regard to her right foot this is actually [name] lot better compared to the last time I saw her which is also great news. 10/11; patient presents for follow-up. Apligraf and the total contact cast was placed to the left leg at last clinic visit. She states that her right foot wound had burning pain to it with the placement of Apligraf to this area. She has been doing [place] over this area. She denies signs of infection including increased warmth, erythema or purulent drainage. 11/1; 3-week follow-up. The patient fortunately did not have [name] total contact cast or an Apligraf and on the left foot. She  has been using Keystone ABD pads and kerlix and her own running shoes She arrives in clinic today with thick callus and [name] very poor surface on the left foot on the right nonviable skin subcutaneous tissue and [name] deep probing hole. 11/15; patient missed her last clinic appointment. She states she has not been dressing the wound beds for the past 2 weeks. She states that at she had [name] new roommate but is now going back to live with her mother. Apparently its been [name] distracting 2 weeks. Patient currently denies signs of infection. 11/22; patient presents for follow-up. She states she has been using Keystone antibiotic and Dakin's wet-to-dry dressings to the wound beds. She is agreeable for cast placement today. We had ordered Apligraf however this has not been received by our facility. 11/29; Patient had [name] total contact cast placed at last clinic visit and she tolerated this well. We were using silver alginate under the cast. Patient's been using Keystone antibiotic with Aquacel to the right plantar foot wound. She has no issues or complaints today. Apligraf is available for placement today. Patient would like to proceed with this. 12/6; patient presents for follow-up. She had Apligraf placed in standard fashion last clinic visit under the total contact cast to the left lower extremity. She has been using Keystone antibiotic and Aquacel to the right plantar foot wound. She has no issues or complaints today. 12/13; patient presents for follow-up. She has finished 5 Apligraf placements. Was told she would not qualify for more. We have been doing [name] total contact cast to the left lower extremity. She has been using Keystone antibiotic and Aquacel to the right plantar foot wound. She has no issues or complaints today. 12/20; patient presents for follow-up. We have been using Hydrofera Blue with Keystone antibiotic under [name] total contact cast of the left lower extremity. She reports using Keystone antibiotic and  silver alginate to the right heel wound. She has no issues or complaints today. 12/27; patient presents with [name] healthy wound on the left  midfoot. We have Apligraf to apply that to that more also using [name] total contact cast. On the right we are using Keystone and silver alginate. She is offloading the right heel with [name] surgical shoealthough by her admission she is on her feet quite [name] bit 1/3; patient presents for follow-up. Apligraf was placed to the wound beds last clinic visit. She was placed in [name] total contact cast to the left lower extremity. She declines [name] total contact cast today. She states that her mother is in the hospital and she cannot adequately get around with the cast on. 1/10; patient presents for follow-up. She declined the total contact cast at last clinic visit. Both wounds have declined in appearance. She states that she has been on her feet and not offloading the wound beds. She currently denies signs of infection. 1/17; patient presents for follow-up. She had the total contact cast along with Apligraf placed last week to the left lower extremity. She tolerated this well. She has been using Aquacel Ag and Keystone antibiotic to the right heel wound. She currently denies signs of infection. She has no issues or complaints today. 1/24; patient presents for follow-up. We have been using Apligraf to the left foot wound along with [name] total contact cast. She has done well with this. T the right o heel wound she has been using Aquacel Ag and Keystone antibiotic ointment. She has no issues or complaints today. She denies signs of infection. 1/31; patient presents for follow-up. We have been using Apligraf to the left foot wound along with the total contact cast. She continues to do well with this. To the right heel we have been using Aquacel Ag and Keystone antibiotic ointment. She has no issues or complaints today. 12-01-2022 upon evaluation patient is seen today on my schedule due to the fact  that she unfortunately was in the hospital yesterday. Her cast needed to come off the only reason she is out of the hospital is due to the fact that they would not take it off at the hospital which is somewhat bewildered reading to me to be perfectly honest. I am not certain why this was but either way she was released and then was placed on my schedule today in order to get this off and reapply the total contact casting as appropriate. I do not have an Apligraf for her today it was applied last week and today's actually expired yesterday as there was some scheduling conflicts with her being in the hospital. Nonetheless we do not have that for reapplication today but the good news that she is not draining too much and the Apligraf can go for up to 2 weeks so I am going to go ahead and reapply the total contact casting but we are going to leave the Apligraf in place. 2/14; patient presents for follow-up. T the left leg she has had the total contact cast and Apligraf for the past week. She has had no issues with this. T the o o right heel she has been using antibiotic ointment and Aquacel Ag. 2/21; patient presents for follow-up. We have been using Apligraf and [name] total contact cast to the left lower extremity. She is tolerated this well. Unfortunately she is not approved for any more Apligraf per insurance. She has been using antibiotic ointment and Aquacel Ag to the right foot. She has no issues or complaints today. 2/28; patient presents for follow-up. We have been using Hydrofera Blue and antibiotic ointment under the total  contact cast to the left lower extremity. He has been using Aquacel Ag with antibiotic ointment to the right plantar foot. She has no issues or complaints today. 3/6; patient presents for follow-up. She did not obtain her gentamicin ointment. She has been using Aquacel Ag to the right plantar foot wound. We have been using Hydrofera Blue with antibiotic ointment under the total  contact cast to the left lower extremity. She has no issues or complaints today. 3/12; patient presents for follow-up. She has been using gentamicin ointment and Aquacel Ag to the right plantar foot wound. We have been using Hydrofera Blue with antibiotic ointment under the total contact cast on the left lower extremity foot wound. She has no issues or complaints today. 3/20; patient presents for follow-up. She has been using gentamicin ointment and Aquacel Ag to the right plantar foot wound. We have been using Hydrofera Blue with antibiotic ointment under the total contact cast to the left lower extremity wound. She has no issues or complaints today. 3/27; patient presents for follow-up. We have been using antibiotic ointment and Aquacel Ag to the right plantar foot wound. We have been using Hydrofera Blue with antibiotic ointment under the total contact cast to the left lower extremity. Both wounds are smaller. 01-24-2023 upon evaluation today patient actually appears to be doing better compared to last time I saw her. It has been several weeks since I last saw her but nonetheless I think the total contact casting is doing [name] good job. Fortunately I do not see any evidence of infection or worsening overall which is great news and in general I do believe that she is moving in the appropriate direction. It has been [name] very long road but nonetheless I feel like she is nearing the end at least in regard to the left foot and even the right foot looks much better to be perfectly honest. 01-31-2023 upon evaluation today patient appears to be doing well currently in regard to her wounds. She has been require some sharp debridement which I think is probably bone both sides to remove [name] lot of callus that has built up at this point. I discussed that with her today. Also think total contact cast is doing well for the left foot Emina continue that as well. 4/17; patient presents for follow-up. We have been doing  Hydrofera Blue with antibiotic ointment under the total contact cast to the left lower extremity and Aquacel Ag with antibiotic ointment to the right foot wound. She has been using her offloading heel shoe here. 4/24; patient presents for follow-up. We have been doing Hydrofera Blue with antibiotic ointment under the total contact cast to the left lower extremity and Flott, [name] ([id number]) [id number].pdf Page 19 of 22 Aquacel Ag with antibiotic ointment to the right foot wound. She has been using her offloading heel shoe here. Wounds are smaller with slough accumulation. 5/1; patient presents for follow-up. We have been doing Hydrofera Blue with antibiotic ointment under the total contact cast to the left lower extremity and Aquacel Ag with antibiotic ointment to the right foot wound. Left foot wound is stable and right foot wound appears smaller. 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 On the left she is using Hydrofera Blue topical antibiotic under [name] total contact cast, on the right plantar heel gentamicin Aquacel Ag. According to our intake nurse both are making slow but steady improvements. 5/15; patient presents for follow-up. We have been doing Hydrofera Blue with antibiotic ointment under the  total contact cast on the left and antibiotic ointment with Aquacel Ag on the right. Unfortunately the left foot wound has declined in size and appearance. We also do not have the total contact cast available in office today. Patient denies signs of infection. 5/22; patient presents for follow-up. We have been doing Hydrofera Blue and antibiotic ointment to the left foot and Aquacel Ag with antibiotic ointment to the right foot. We took [name] break from the cast since we did not have it placed at last week. Wounds look stable if not slightly improved. 03-27-2023 patient presents today she has had this For Most 2 Weeks Because She Was in the Hospital and They Did Not T It off.  Again That Is Really Not ake Something That She Could Control but Nonetheless We Are Seeing Her at This Point Today for Reevaluation. We Do Want to Go and See about Reapply the Cast. Again We Had All Set Everything up to Go Ahead and Do This When We Went to Get the Cast Only to Realize That Not Had Come in and We Were out at the Moment. With That Being [place] We Will Get Have T Get the Exam before We Can Reapply the T Contact Casting Going Forward. o otal 6/12; patient presents for follow-up. She has been using Hydrofera Blue to the left foot wound and Aquacel Ag to the right foot wound. No cast was available last week placed on the patient so she has been offloading with her surgical shoe. She has no issues or complaints today. 6/19; patient presents for follow-up. We have been using Hydrofera Blue to the left foot wound under the total contact cast and Aquacel Ag to the right wound. She has no issues or complaints today. The wounds are smaller. 6/26; patient presents for follow-up. We have been using Hydrofera Blue to the left foot wound under the total contact cast and Aquacel Ag to the right foot wound. Wounds are smaller. Patient has no issues or complaints today. 7/3; patient presents for follow-up. We have been using Hydrofera Blue to the left foot wound under the total contact cast and Aquacel Ag to the right foot wound. Wounds Appear smaller. No signs of infection. 7/10; patient presents for follow-up. We have been using Hydrofera Blue to the left foot wound under the total contact cast and Aquacel Ag to the right foot wound. Left foot wound is smaller. Right foot wound is stable. 7/17; patient presents for follow-up. We have been using Hydrofera Blue to the left foot under the total contact cast and Aquacel Ag to the right foot wound. She has no issues or complaints today. 7/24; patient presents for follow-up. We have been using Hydrofera Blue to the left foot under the total contact cast  and Aquacel Ag to the right foot wound. Unfortunately she was bit by her dog over the weekend and developed [name] wound to the right lower leg. She has been keeping the area covered. 7/31; patient presents for follow-up. We have been using Hydrofera Blue to the left foot under the total contact cast and Aquacel Ag to the right foot wound and mupirocin  ointment to the right anterior leg wound. She started her oral antibiotics 2 days ago. 8/7; patient presents for follow-up. We have been using Hydrofera Blue and antibiotic ointment to the left foot under the total contact cast and Aquacel Ag to the right foot wound. Antibiotic ointment to the right anterior leg wound. She completed her oral antibiotics. She has no  issues or complaints today. Wounds continue to improve albeit slowly. 8/14; 8/14; patient has 3 wounds 1 on the left plantar heel 1 on the right plantar heel and [name] small wound on the right anterior lower leg. We have been using Hydrofera Blue in the left heel Aquacel on the right heel. The left heel has been in [name] total contact cast 8/21; patient presents for follow-up. We have been using Hydrofera Blue to the left foot wound under the total contact cast. T the right heel patient has been o using Aquacel Ag. T the anterior leg wound antibiotic ointment. All wounds have healed. o 8/28; patient presents for follow-up. She has been using her surgical shoe with offloading felt pads. Her wounds have remained closed. 9/11; patient presents for final follow-up to assure that her wounds have remained closed. Unfortunately the left foot wound has opened again. But she has been wearing her Surgical shoe for offloading with felt pad. 07-14-2023 upon evaluation today patient appears to be doing well currently in regard to her wound. She has been tolerating the dressing changes without complication with total contact casting. I am going to be helping take care of her at this point as she is transferring from  Dr. Rosan to me since Dr. Rosan is on maternity leave. 07-24-2023 upon evaluation today patient appears to be doing decently well currently in regard to her wound. She is actually showing signs of improvement in regard to the left foot and I feel like this is much better than last week no debridement even necessary today. We may have to perform some slight debridement come next week we will see how things go. Monitor for any signs of worsening overall and obviously if anything changes she should be letting us  know. 08-01-2023 upon evaluation today patient appears to be doing excellent in regard to her feet. The right foot/heel area is completely healed she does have some callus buildup I am going to trim this down [name] little bit I do not want to build up to the point of cracking she is doing well with the urea  cream. With regard to the left heel she is doing well with casting I am going to perform some debridement here to clear away before reapplying the cast and I think she is really doing quite well though. 08-07-2023 upon evaluation patient's wound actually showed signs of excellent improvement and actually very pleased with where we stand I do believe that she is making good headway towards complete closure which is great news. 08-14-2023 upon evaluation today patient appears to be doing well currently in regard to her wound. She actually is going require [name] little bit of debridement but fortunately nothing too significant today actually feel like she is doing much better. This is mainly to remove callus on the little slough and biofilm down to good subcutaneous tissue. In general I think that she is making excellent headway here. 08-21-2023 upon evaluation today patient appears to be doing well currently in regard to her wound which is actually showing signs of significant improvement. I am extremely pleased with where things stand I do not see any signs of infection at this time which is great  news and overall I do think that we are making headway towards complete closure which is also excellent news. 08-30-2023 upon evaluation today patient's wound is showing signs of continued improvement. Fortunately I do not see any evidence of worsening overall and I believe that the patient is making excellent headway towards  closure. I am extremely pleased with where we stand. 09-04-2023 upon evaluation today patient appears to be doing well currently in regard to her foot ulcer. She has been tolerating the dressing changes without complication. Fortunately I do not see any signs of active infection locally or systemically at this time which is great news. No fevers, chills, nausea, vomiting, or diarrhea. 09-11-2023 upon evaluation today patient appears to be doing well currently in regard to her wound on the left foot. I feel like she is actually showing signs of SHONICA, WEIER [name] ([id number]) [id number].pdf Page 20 of 22 improvement I feel like the collagen is doing [name] good job. With that being said unfortunately she has an area where there is [name] spot on the right foot that has not really open but at least there looks like there may have been something that popped off of this area. 09-18-2023 upon evaluation today patient appears to be doing well currently in regard to her wound although to be honest she still continues to have issues here with the wound not healing as rapidly as I would expect. She does [name] lot of walking on the cast from what I am hearing today and to be honest as I mention to her previously this cast is not exactly appropriate for her based on the size of her leg is technically too small and I think this is causing issues with rubbing and therefore callus buildup that would not otherwise occur with [name] properly fitting cast. I discussed that with her today and I think that potentially having [name] offloading shoe would be better specifically [name] front offloading  boot. 09-25-2023 upon evaluation patient's wound is showing signs of improvement which is great news and in general I do feel like that she is really doing quite well. I do not see any signs of active infection at this time which is excellent and in general I think that we are moving in the right direction here. She did get the offloading boot off of [employer] that working to get [name] try it is [name] front off loader which I think should do quite well and we will get [name] see how that does over the next week. 10-02-2023 upon evaluation today patient's wound actually showing signs of good improvement. I actually feel like it is overall looking better there is still quite [name] bit of callus but I did go ahead and perform debridement today to clear this away. She in general seems to be doing quite well. 10-09-2023 upon evaluation today patient unfortunately appears to be doing worse in regard to her wound she is showing signs of infection and this is not good. Fortunately I do not see any evidence of deeper wound but at the same time I do feel like there is some area underneath the callus where this has spread again we have previously gotten that healed. I think that she is probably going to need some oral antibiotic therapy. 10-17-2023 upon evaluation today patient unfortunately appears to be doing better with regard to the infection but has [name] lot more callus buildup. I really do not think that the boot is working out well for her. 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 Either way I do not believe at this point that this is going to be the appropriate way to continue currently. 12/31; patient seen in follow-up. 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 2 weeks ago she was felt to have an infection. [name] swab culture for PCR showed significant copies of Pseudomonas, group [name] strep, anaerobic strep and Enterococcus faecalis.  She was given [name] 2-week course of Cipro which she has now finished. She was in [name] total contact cast and doing well but she has not had the cast on and almost [name] month now. We have been using Hydrofera Blue on the wound 11-06-2023 upon evaluation today patient appears to be doing well currently in regard to her wound from an infection standpoint I do not see anything that appears to be open. Unfortunately she continues to have [name] significant issue here with her wound. We are unable to get this really closed as we would like to see. 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 Objective Constitutional Well-nourished and well-hydrated in no acute distress. Vitals Time Taken: 9:08 AM, Height: 69 in, Weight: 178 lbs, BMI: 26.3, Temperature: 99.2 F, Pulse: 60 bpm, Respiratory Rate: 18 breaths/min, Blood Pressure: 104/66 mmHg. Respiratory normal breathing without difficulty. Psychiatric this patient is able to make decisions and demonstrates good insight into disease process. Alert and Oriented x 3. pleasant and cooperative. General Notes: upon evaluation here patient's wound bed actually showed signs of poor granulation epithelization there was [name] tremendous amount of callus I did perform debridement to clear away the callus postdebridement wound bed appears to be improved. Integumentary (Hair, Skin) Wound #12R status is Open. Original cause of wound was Pressure Injury. The date acquired was: 03/16/2020. The wound has been in treatment 85 weeks. The wound is located on the Left,Medial,Plantar Foot. The wound measures 1.5cm length x 1cm width x 0.4cm depth; 1.178cm^2 area and 0.471cm^3 volume. There is Fat Layer (Subcutaneous Tissue) exposed. There is [name] medium amount of serosanguineous drainage noted. The wound margin is flat and intact. There is medium (34-66%) pink granulation  within the wound bed. There is no necrotic tissue within the wound bed. 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 There was [name] Excisional Skin/Subcutaneous Tissue Debridement with [name] total area of 1.18 sq cm performed by Heather Ferraris, PA-C. With the following instrument(s): Curette to remove Viable and Non-Viable tissue/material. Material removed includes Callus, Subcutaneous Tissue, and Slough. [name] time out was conducted at 09:30, prior to the start of the procedure. [name] Minimum amount of bleeding was controlled with Pressure. The procedure was tolerated well. Post Debridement Measurements: 1.5cm length x 1cm width x 0.4cm depth; 0.471cm^3 volume. Character of Wound/Ulcer Post Debridement is stable. Severity of Tissue Post Debridement is: Fat layer exposed. Post procedure Diagnosis Wound #12R: Same as Pre-Procedure Pre-procedure diagnosis of Wound #12R is [name] Diabetic Wound/Ulcer of the Lower Extremity located on the Left,Medial,Plantar Foot . There was [name] T Contact otal Cast Procedure by Heather Ferraris, PA-C. Post procedure Diagnosis Wound #12R: Same as Pre-Procedure Plan 1. I am going to go ahead and  place her back in the total contact cast which I think is gena be ideal here. 2. Also can recommend patient should continue with the wound care measures as before specifically with regard to the Hydrofera Blue and mupirocin  I think that  still appropriate. 3. I would also suggest that she should continue to try to stay off of this is much as possible even with the cast although I think this is gena take [name] lot of the pressure away. We have had difficulty in the past getting her healed even with the cast I am unsure how this is getting go we shall see. We will see patient back for reevaluation in 1 week here in the clinic. If anything worsens or changes patient will contact our office for additional recommendations. Electronic Signature(s) Signed: 11/06/2023 9:43:51 AM By: Heather Ferraris PA-C Entered By: Heather Ferraris on 11/06/2023 09:43:50 -------------------------------------------------------------------------------- Total Contact Cast Details Patient Name: Date of Service: HOORAIN, KOZAKIEWICZ 11/06/2023 9:00 [name]: [id number] Patient Account Number: [id number] Date of Birth/Sex: Treating RN: April 05, 1976 (48 y.o. [name] Primary Care Provider: [name] Other Clinician: [name] Referring Provider: Treating Provider/Extender: Heather Ferraris [name] Devra in Treatment: 14 T Contact Cast Applied for Wound Assessment: otal Wound #12R Left,Medial,Plantar Foot Performed By: Physician Heather Ferraris, PA-C The following information was scribed by: [name] The information was scribed for: Heather Ferraris Post Procedure Diagnosis Same as Pre-procedure Electronic Signature(s) Signed: 11/06/2023 4:43:18 PM By: Heather Ferraris PA-C Signed: 11/06/2023 4:48:09 PM By: [name] Entered By: [name] on 11/06/2023 09:29:54 CECILIE RAYFIELD [name] ([id number]) [id number] Page 22 of 22 -------------------------------------------------------------------------------- SuperBill Details Patient Name: Date of Service: LAHARI, SUTTLES 11/06/2023 Medical Record Number: [id number] Patient Account Number: [id number] Date of Birth/Sex: Treating RN: September 17, 1976 (48 y.o. Heather Heather,  Carrie Primary Care Provider: [name] Other Clinician: [name] Referring Provider: Treating Provider/Extender: Heather Ferraris [name] Weeks in Treatment: 63 Diagnosis Coding ICD-10 Codes Code Description E11.621 Type 2 diabetes mellitus with foot ulcer L97.528 Non-pressure chronic ulcer of other part of left foot with other specified severity E11.42 Type 2 diabetes mellitus with diabetic polyneuropathy Z79.4 Long term (current) use of insulin  N18.6 End stage renal disease Z99.2 Dependence on renal dialysis L84 Corns and callosities Facility Procedures : CPT4 Code: [id number] Description: 11042 - DEB SUBQ TISSUE 20 SQ CM/< ICD-10 Diagnosis Description L97.528 Non-pressure chronic ulcer of other part of left foot with other specified seve Modifier: rity Quantity: 1 Physician Procedures : CPT4 Code Description Modifier 11042 11042 - WC PHYS SUBQ TISS 20 SQ CM ICD-10 Diagnosis Description L97.528 Non-pressure chronic ulcer of other part of left foot with other specified severity Quantity: 1 Electronic Signature(s) Signed: 11/06/2023 9:44:03 AM By: Heather Ferraris PA-C Entered By: Heather Ferraris on 11/06/2023 09:44:03

## 2023-11-13 ENCOUNTER — Encounter: Payer: MEDICAID | Admitting: Physician Assistant

## 2023-11-13 DIAGNOSIS — E11621 Type 2 diabetes mellitus with foot ulcer: Secondary | ICD-10-CM | POA: Diagnosis not present

## 2023-11-13 NOTE — Progress Notes (Addendum)
[name] [name] ([id number]) [id number] Page 1 of 8 Visit Report for 11/13/2023 Arrival Information Details Patient Name: Date of Service: [name], [name] 11/13/2023 9:00 [name]: [id number] Patient Account Number: [id number] Date of Birth/Sex: Treating RN: [birth year] (48 y.o. [name] Primary Care Isahia Hollerbach: [name] Other Clinician: [name] Referring Bishop Vanderwerf: Treating Adrean Findlay/Extender: [name] in Treatment: 52 Visit Information History Since Last Visit All ordered tests and consults were completed: No Patient Arrived: Wheel Chair Added or deleted any medications: No Arrival Time: 09:10 Any new allergies or adverse reactions: No Transfer Assistance: EasyPivot Patient Lift Had [name] fall or experienced change in No Patient Identification Verified: Yes activities of daily living that may affect Secondary Verification Process Completed: Yes risk of falls: Patient Requires Transmission-Based Precautions: No Signs or symptoms of abuse/neglect since last visito No Patient Has Alerts: No Hospitalized since last visit: No Implantable device outside of the clinic excluding No cellular tissue based products placed in the center since last visit: Has Dressing in Place as Prescribed: Yes Has Footwear/Offloading in Place as Prescribed: Yes Left: T Contact Cast otal Pain Present Now: No Electronic Signature(s) Signed: 11/14/2023 5:15:40 PM By: [name] Entered By: [name] on 11/13/2023 09:11:04 -------------------------------------------------------------------------------- Clinic Level of Care Assessment Details Patient Name: Date of Service: [name], [name] 11/13/2023 9:00 [name]: [id number] Patient Account Number: [id number] Date of Birth/Sex: Treating RN: 1976-07-18 (48 y.o. [name] Primary Care Lukah Goswami: [name] Other Clinician: [name] Referring Diann Bangerter: Treating Emre Stock/Extender: [name] in Treatment: 86 Clinic Level of Care Assessment Items TOOL 1 Quantity Score []  - 0 Use when EandM and Procedure is performed on INITIAL visit ASSESSMENTS - Nursing Assessment / Reassessment []  - 0 General Physical Exam (combine w/ comprehensive assessment (listed just below) when performed on new pt. evalsJONNITA, [name] [name] ([id number]) [id number] Page 2 of 8 []  - 0 Comprehensive Assessment (HX, ROS, Risk Assessments, Wounds Hx, etc.) ASSESSMENTS - Wound and Skin Assessment / Reassessment []  - 0 Dermatologic / Skin Assessment (not related to wound area) ASSESSMENTS - Ostomy and/or Continence Assessment and Care []  - 0 Incontinence Assessment and Management []  - 0 Ostomy Care Assessment and Management (repouching, etc.) PROCESS - Coordination of Care []  - 0 Simple Patient / Family Education for ongoing care []  - 0 Complex (extensive) Patient / Family Education for ongoing care []  - 0 Staff obtains Chiropractor, Records, T Results / Process Orders est []  - 0 Staff telephones HHA, Nursing Homes / Clarify orders / etc []  - 0 Routine Transfer to another Facility (non-emergent condition) []  - 0 Routine Hospital Admission (non-emergent condition) []  - 0 New Admissions / Manufacturing engineer / Ordering NPWT Apligraf, etc. , []  - 0 Emergency Hospital Admission (emergent condition) PROCESS - Special Needs []  - 0 Pediatric / Minor Patient Management []  - 0 Isolation Patient Management []  - 0 Hearing / Language / Visual special needs []  - 0 Assessment of Community assistance (transportation, D/C planning, etc.) []  - 0 Additional assistance / Altered mentation []  - 0 Support Surface(s) Assessment (bed, cushion, seat, etc.) INTERVENTIONS - Miscellaneous []  - 0 External ear exam []  - 0 Patient Transfer (multiple staff / Nurse, adult / Similar devices) []  -  0 Simple Staple / Suture removal (25 or less) []  - 0 Complex Staple / Suture removal (26 or more) []  - 0 Hypo/Hyperglycemic Management (do not check if billed separately) []  -  0 Ankle / Brachial Index (ABI) - do not check if billed separately Has the patient been seen at the hospital within the last three years: Yes Total Score: 0 Level Of Care: ____ Electronic Signature(s) Signed: 11/14/2023 5:15:40 PM By: [name] Entered By: [name] on 11/13/2023 09:55:29 -------------------------------------------------------------------------------- Encounter Discharge Information Details Patient Name: Date of Service: [name] 11/13/2023 9:00 [name]: [id number] Patient Account Number: [id number] Date of Birth/Sex: Treating RN: 1975/11/28 (48 y.o. [name], [name] ([id number]) [id number] Page 3 of 8 Primary Care Nahal Wanless: [name] Other Clinician: [name] Referring Syrianna Schillaci: Treating Shon Mansouri/Extender: [name] in Treatment: 51 Encounter Discharge Information Items Post Procedure Vitals Discharge Condition: Stable Temperature (F): 98.5 Ambulatory Status: Wheelchair Pulse (bpm): 66 Discharge Destination: Home Respiratory Rate (breaths/min): 18 Transportation: Private Auto Blood Pressure (mmHg): 116/72 Accompanied By: mother Schedule Follow-up Appointment: Yes Clinical Summary of Care: Electronic Signature(s) Signed: 11/14/2023 5:15:40 PM By: [name] Entered By: [name] on 11/13/2023 10:17:38 -------------------------------------------------------------------------------- Lower Extremity Assessment Details Patient Name: Date of Service: [name], [name] 11/13/2023 9:00 [name]: [id number] Patient Account Number: [id number] Date of Birth/Sex: Treating RN: Feb 18, 1976 (48 y.o. [name] Primary Care Sheritta Deeg: [name] Other  Clinician: [name] Referring Jeriann Sayres: Treating Shareese Macha/Extender: [name] Weeks in Treatment: 65 Electronic Signature(s) Signed: 11/14/2023 9:06:19 AM By: Yevonne Pax RN Signed: 11/14/2023 5:15:40 PM By: [name] Entered By: [name] on 11/13/2023 09:23:36 -------------------------------------------------------------------------------- Multi Wound Chart Details Patient Name: Date of Service: [name] 11/13/2023 9:00 [name]: [id number] Patient Account Number: [id number] Date of Birth/Sex: Treating RN: November 16, 1975 (47 y.o. [name] Primary Care Javarian Jakubiak: [name] Other Clinician: [name] Referring Lucian Baswell: Treating Rozann Holts/Extender: [name] in Treatment: 41 Vital Signs Height(in): 69 Pulse(bpm): 66 Weight(lbs): 178 Blood Pressure(mmHg): 116/72 Body Mass Index(BMI): 26.3 Temperature(F): 98.5 Respiratory Rate(breaths/min): 18 Kinoshita, [name] ([id number]) [id number] Page 4 of 8 [12R:Photos:] [N/[name]:N/[name]] Left, Medial, Plantar Foot N/[name] N/[name] Wound Location: Pressure Injury N/[name] N/[name] Wounding Event: Diabetic Wound/Ulcer of the Lower N/[name] N/[name] Primary Etiology: Extremity Chronic sinus problems/congestion, N/[name] N/[name] Comorbid History: Middle ear problems, Anemia, Chronic Obstructive Pulmonary Disease (COPD), Congestive Heart Failure, Type II Diabetes, End Stage Renal Disease, History of pressure wounds, Neuropathy 03/16/2020 N/[name] N/[name] Date Acquired: 61 N/[name] N/[name] Weeks of Treatment: Open N/[name] N/[name] Wound Status: Yes N/[name] N/[name] Wound Recurrence: 13.7x0.9x0.2 N/[name] N/[name] Measurements L x W x D (cm) 9.684 N/[name] N/[name] [name] (cm) : rea 1.937 N/[name] N/[name] Volume (cm) : -105.50% N/[name] N/[name] % Reduction in [name] rea: -105.60% N/[name] N/[name] % Reduction in Volume: Grade 3 N/[name] N/[name] Classification: Medium N/[name] N/[name] Exudate [name] mount: Serosanguineous N/[name] N/[name] Exudate Type: red, brown N/[name]  N/[name] Exudate Color: Flat and Intact N/[name] N/[name] Wound Margin: Medium (34-66%) N/[name] N/[name] Granulation [name] mount: Pink N/[name] N/[name] Granulation Quality: None Present (0%) N/[name] N/[name] Necrotic [name] mount: Fat Layer (Subcutaneous Tissue): Yes N/[name] N/[name] Exposed Structures: Fascia: No Tendon: No Muscle: No Joint: No Bone: No Medium (34-66%) N/[name] N/[name] Epithelialization: Treatment Notes Electronic Signature(s) Signed: 11/14/2023 5:15:40 PM By: [name] Entered By: [name] on 11/13/2023 09:23:40 -------------------------------------------------------------------------------- Multi-Disciplinary Care Plan Details Patient Name: Date of Service: [name] 11/13/2023 9:00 [name]: [id number] Patient Account Number: [id number] Date of Birth/Sex: Treating RN: August 05, 1976 (48 y.o. [name] Primary Care Tanyon Alipio: [name] Other Clinician: Glynda Jaeger,  Angie Referring Jeremiyah Cullens: Treating Isabele Lollar/Extender: [name] in Treatment: [address] Delynda [name] ([id number]) [id number] Page 5 of 8 Active Inactive Abuse / Safety / Falls / Self Care Management Nursing Diagnoses: History of Falls Potential for falls Potential for injury related to falls Goals: Patient will not develop complications from immobility Date Initiated: 03/17/2022 Date Inactivated: 04/20/2022 Target Resolution Date: 03/16/2022 Goal Status: Met Patient/caregiver will verbalize understanding of skin care regimen Date Initiated: 03/17/2022 Date Inactivated: 09/28/2022 Target Resolution Date: 03/16/2022 Goal Status: Met Patient/caregiver will verbalize/demonstrate measure taken to improve self care Date Initiated: 03/17/2022 Target Resolution Date: 06/21/2023 Goal Status: Active Interventions: Assess fall risk on admission and as needed Provide education on basic hygiene Provide education on personal and home safety Notes: Electronic Signature(s) Signed:  11/14/2023 9:06:19 AM By: Yevonne Pax RN Signed: 11/14/2023 5:15:40 PM By: [name] Entered By: [name] on 11/13/2023 09:55:41 -------------------------------------------------------------------------------- Pain Assessment Details Patient Name: Date of Service: [name] 11/13/2023 9:00 [name]: [id number] Patient Account Number: [id number] Date of Birth/Sex: Treating RN: 1976-08-18 (47 y.o. [name] Primary Care Electra Paladino: [name] Other Clinician: [name] Referring Irma Roulhac: Treating Gillian Kluever/Extender: [name] in Treatment: 42 Active Problems Location of Pain Severity and Description of Pain Patient Has Paino No Site Locations [name] ([id number]) [id number] Page 6 of 8 Pain Management and Medication Current Pain Management: Electronic Signature(s) Signed: 11/14/2023 9:06:19 AM By: Yevonne Pax RN Signed: 11/14/2023 5:15:40 PM By: [name] Entered By: [name] on 11/13/2023 09:13:57 -------------------------------------------------------------------------------- Patient/Caregiver Education Details Patient Name: Date of Service: [name] 1/20/2025andnbsp9:00 [name]: [id number] Patient Account Number: [id number] Date of Birth/Gender: Treating RN: 1976-01-03 (48 y.o. [name] Primary Care Physician: [name] Other Clinician: [name] Referring Physician: Treating Physician/Extender: [name] in Treatment: 76 Education Assessment Education Provided To: Patient Education Topics Provided Wound/Skin Impairment: Handouts: Other: discussed staying off foot with total contact cast Methods: Explain/Verbal Responses: State content correctly Electronic Signature(s) Signed: 11/14/2023 5:15:40 PM By: [name] Entered By: [name] on 11/13/2023 09:56:30 [name]  ([id number]) [id number] Page 7 of 8 -------------------------------------------------------------------------------- Wound Assessment Details Patient Name: Date of Service: [name], [name] 11/13/2023 9:00 [name]: [id number] Patient Account Number: [id number] Date of Birth/Sex: Treating RN: 1976-07-09 (48 y.o. [name] Primary Care Kaidynce Pfister: [name] Other Clinician: [name] Referring Tamaya Pun: Treating Tally Mattox/Extender: [name] Weeks in Treatment: 86 Wound Status Wound Number: 12R Primary Diabetic Wound/Ulcer of the Lower Extremity Etiology: Wound Location: Left, Medial, Plantar Foot Wound Open Wounding Event: Pressure Injury Status: Date Acquired: 03/16/2020 Comorbid Chronic sinus problems/congestion, Middle ear problems, Anemia, Weeks Of Treatment: 86 History: Chronic Obstructive Pulmonary Disease (COPD), Congestive Heart Clustered Wound: No Failure, Type II Diabetes, End Stage Renal Disease, History of pressure wounds, Neuropathy Photos Wound Measurements Length: (cm) 1.7 Width: (cm) 0.9 Depth: (cm) 0.2 Area: (cm) 1.20 Volume: (cm) 0.24 % Reduction in Area: 74.5% % Reduction in Volume: 74.5% Epithelialization: Medium (34-66%) 2 Wound Description Classification: Grade 3 Wound Margin: Flat and Intact Exudate Amount: Medium Exudate Type: Serosanguineous Exudate Color: red, brown Foul Odor After Cleansing: No Slough/Fibrino Yes Wound Bed Granulation Amount: Medium (34-66%) Exposed Structure Granulation Quality: Pink Fascia Exposed: No Necrotic Amount: None Present (0%) Fat Layer (Subcutaneous Tissue) Exposed: Yes Tendon Exposed: No Muscle Exposed: No Joint Exposed: No Bone Exposed: No Treatment Notes Wound #12R (Foot)  Wound Laterality: Plantar, Left, Medial Cleanser Vashe 5.8 (oz) Discharge Instruction: Use vashe 5.8 (oz) as directed [name], [name] ([id number])  [id number] Page 8 of 8 Heather-Wound Care Topical Mupirocin Ointment Discharge Instruction: Apply as directed by Jude Naclerio. Primary Dressing Silvercel Small 2x2 (in/in) Discharge Instruction: Apply Silvercel Small 2x2 (in/in) as instructed Secondary Dressing Gauze Discharge Instruction: As directed: dry, moistened with saline or moistened with Dakins Solution Secured With Medipore T - 78M Medipore H Soft Cloth Surgical T ape ape, 2x2 (in/yd) Kerlix Roll Sterile or Non-Sterile 6-ply 4.5x4 (yd/yd) Discharge Instruction: Apply Kerlix as directed Compression Wrap Compression Stockings Add-Ons Electronic Signature(s) Signed: 11/14/2023 9:06:19 AM By: Yevonne Pax RN Signed: 11/14/2023 5:15:40 PM By: [name] Entered By: [name] on 11/13/2023 09:45:11 -------------------------------------------------------------------------------- Vitals Details Patient Name: Date of Service: [name], [name] 11/13/2023 9:00 [name]: [id number] Patient Account Number: [id number] Date of Birth/Sex: Treating RN: Jul 11, 1976 (48 y.o. [name] Primary Care Barnet Benavides: [name] Other Clinician: [name] Referring Khyrie Masi: Treating Naol Ontiveros/Extender: [name] in Treatment: 69 Vital Signs Time Taken: 09:11 Temperature (F): 98.5 Height (in): 69 Pulse (bpm): 66 Weight (lbs): 178 Respiratory Rate (breaths/min): 18 Body Mass Index (BMI): 26.3 Blood Pressure (mmHg): 116/72 Reference Range: 80 - 120 mg / dl Electronic Signature(s) Signed: 11/14/2023 5:15:40 PM By: [name] Entered By: [name] on 11/13/2023 09:13:53

## 2023-11-13 NOTE — Progress Notes (Signed)
Heather, BROCIOUS [name] ([id number]) [id number] Page 1 of 9 Visit Report for 11/13/2023 Chief Complaint Document Details Patient Name: Date of Service: [name], [name] 11/13/2023 9:00 [name]: [id number] Patient Account Number: [id number] Date of Birth/Sex: Treating RN: [birth year] (48 y.o. [name] Primary Care Provider: [name] Other Clinician: [name] Referring Provider: Treating Provider/Extender: [name] Weeks in Treatment: 70 Information Obtained from: Patient Chief Complaint Left foot ulcer Electronic Signature(s) Signed: 11/13/2023 8:56:52 AM By: [name] PA-C Entered By: [name] on 11/13/2023 08:56:52 -------------------------------------------------------------------------------- Debridement Details Patient Name: Date of Service: [name] 11/13/2023 9:00 [name]: [id number] Patient Account Number: [id number] Date of Birth/Sex: Treating RN: 1975-12-05 (47 y.o. [name] Primary Care Provider: [name] Other Clinician: [name] Referring Provider: Treating Provider/Extender: [name] in Treatment: 86 Debridement Performed for Assessment: Wound #12R Left,Medial,Plantar Foot Performed By: Physician [name], PA-C The following information was scribed by: [name] The information was scribed for: [name] Debridement Type: Debridement Severity of Tissue Pre Debridement: Fat layer exposed Level of Consciousness (Pre-procedure): Awake and Alert Pre-procedure Verification/Time Out Yes - 09:47 Taken: Start Time: 09:47 Percent of Wound Bed Debrided: 100% T Area Debrided (cm): otal 1.2 Tissue and other material debrided: Viable, Non-Viable, Callus, Slough, Subcutaneous, Slough Level: Skin/Subcutaneous Tissue Debridement Description: Excisional Instrument: Curette Bleeding: Minimum Hemostasis Achieved:  Pressure Daino, Aaima [name] ([id number]) [id number] Page 2 of 9 Response to Treatment: Procedure was tolerated well Level of Consciousness (Post- Awake and Alert procedure): Post Debridement Measurements of Total Wound Length: (cm) 1.7 Width: (cm) 0.9 Depth: (cm) 0.2 Volume: (cm) 0.24 Character of Wound/Ulcer Post Debridement: Stable Severity of Tissue Post Debridement: Fat layer exposed Post Procedure Diagnosis Same as Pre-procedure Electronic Signature(s) Signed: 11/13/2023 2:39:25 PM By: [name] PA-C Signed: 11/14/2023 9:06:19 AM By: Yevonne Pax RN Entered By: [name] on 11/13/2023 14:39:25 -------------------------------------------------------------------------------- HPI Details Patient Name: Date of Service: Heather Carry, Yareth [name]. 11/13/2023 9:00 [name]: [id number] Patient Account Number: [id number] Date of Birth/Sex: Treating RN: 1976/04/07 (48 y.o. [name] Primary Care Provider: [name] Other Clinician: [name] Referring Provider: Treating Provider/Extender: [name] in Treatment: 27 History of Present Illness HPI Description: READMISSION 03/19/2021 This is [name] patient that we cared for in this clinic for [name] prolonged period of time in 2019 and 2020 with [name] left foot and left first toe wound. 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 Excell Seltzer. In December 2021 she had an ulcer on her right great toe as well as the fourth and fifth toes. 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 She also had an angiogram at that time and underwent angioplasty of the right anterior tibial artery. In any case she claims that the wound on the right foot is closed I did not look at this today which was probably an oversight although I think that should be  done next week. 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 According to Dr. Bernette Redbird last review that she was out of the area being cared for by another physician but recently came back to his attention. 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 [name] culture of this area showed  E. coli apparently before she came back to see Dr. Excell Seltzer she was supposed to be receiving antibiotics but she did not really take them. Nor is she offloading this area at all. Finally her last hemoglobin A1c listed in epic was in March 2022 at 14.1 she says things are [name] lot better since then although I am not sure. 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 She had this wound at the time. During this admission she had E. coli sepsis [name] TEE was negative. 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 She is still [name] heavy cigarette smoker. She has [name] history of bipolar disorder seizure disorder ABI in our clinic was 1.05 11-13-23 upon evaluation today patient appears to be doing well currently in regard to her wound all things considered. Fortunately I do not see any signs of active infection which is great news. With that being said I do think that she though not infected still is having quite [name] bit of callus buildup she is up moving around with her foot [name] lot she tells me although she tries not to do any more than she has to. Nonetheless I think that there is just on an medical things that she is having new during the day they are keeping her moving fairly frequently. Electronic Signature(s) Signed: 11/13/2023 2:36:00 PM By: [name]  PA-C Entered By: [name] on 11/13/2023 14:36:00 [name] ([id number]) [id number] Page 3 of 9 -------------------------------------------------------------------------------- Physical Exam Details Patient Name: Date of Service: BRYCELYNN, EMPEY 11/13/2023 9:00 [name]: [id number] Patient Account Number: [id number] Date of Birth/Sex: Treating RN: [birth year] (48 y.o. [name] Primary Care Provider: [name] Other Clinician: [name] Referring Provider: Treating Provider/Extender: [name] in Treatment: 45 Constitutional Well-nourished and well-hydrated in no acute distress. Respiratory normal breathing without difficulty. Psychiatric this patient is able to make decisions and demonstrates good insight into disease process. Alert and Oriented x 3. pleasant and cooperative. Notes Upon inspection patient's wound bed actually showed signs of poor granulation epithelization at this point. I do believe that she is tolerating the cast but I do not think the cast is working and functioning quite like it should I think her foot is extremely short and extremely small on top of the fact that her leg is somewhat small I discussed this with her she was in the cast prior to me taking over her care and to be honest she has come very close to healing with the cast but then everything just got worse. Nonetheless at this point I feel like that she may benefit from trying to continue this as I think it is probably better than nothing although she still is having some pretty significant issues here. T be honest I am worried about how this is going to play out. Furniture conservator/restorer) Signed: 11/13/2023 2:36:49 PM By: [name] PA-C Entered By: [name] on 11/13/2023 14:36:49 -------------------------------------------------------------------------------- Physician Orders Details Patient Name: Date of  Service: [name] 11/13/2023 9:00 [name]: [id number] Patient Account Number: [id number] Date of Birth/Sex: Treating RN: Mar 11, 1976 (47 y.o. [name] Primary Care Provider: [name] Other Clinician: [name] Referring  Provider: Treating Provider/Extender: [name] in Treatment: 86 The following information was scribed by: [name] The information was scribed for: [name] Verbal / Phone Orders: No Diagnosis Coding ICD-10 Coding Code Description E11.621 Type 2 diabetes mellitus with foot ulcer L97.528 Non-pressure chronic ulcer of other part of left foot with other specified severity CHAWN, RACKLIFF [name] ([id number]) [id number] Page 4 of 9 E11.42 Type 2 diabetes mellitus with diabetic polyneuropathy Z79.4 Long term (current) use of insulin N18.6 End stage renal disease Z99.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 Bathing/ Shower/ Hygiene May shower with wound dressing protected with water repellent cover or cast protector. No tub bath. Anesthetic (Use 'Patient Medications' Section for Anesthetic Order Entry) Lidocaine applied to wound bed - as needed Off-Loading Total Contact Cast to Left Lower Extremity - applied today 11/13/2023 Open toe surgical shoe - right foot Wound Treatment Wound #12R - Foot Wound Laterality: Plantar, Left, Medial Cleanser: Vashe 5.8 (oz) 1 x Per Week/30 Days Discharge Instructions: Use vashe 5.8 (oz) as directed Topical: Mupirocin Ointment 1 x Per Week/30 Days Discharge Instructions: Apply as directed by provider. Prim Dressing: Silvercel Small 2x2 (in/in) 1 x Per Week/30 Days ary Discharge Instructions: Apply Silvercel Small 2x2 (in/in) as instructed Secondary Dressing: Gauze 1 x Per Week/30 Days Discharge Instructions: As directed: dry, moistened with saline or moistened with  Dakins Solution Secured With: Medipore T - 64M Medipore H Soft Cloth Surgical T ape ape, 2x2 (in/yd) 1 x Per Week/30 Days Secured With: [employer] or Non-Sterile 6-ply 4.5x4 (yd/yd) 1 x Per Week/30 Days Discharge Instructions: Apply Kerlix as directed Electronic Signature(s) Signed: 11/13/2023 5:18:10 PM By: [name] PA-C Signed: 11/14/2023 5:15:40 PM By: [name] Entered By: [name] on 11/13/2023 09:53:20 -------------------------------------------------------------------------------- Problem List Details Patient Name: Date of Service: [name] 11/13/2023 9:00 [name]: [id number] Patient Account Number: [id number] Date of Birth/Sex: Treating RN: [birth year] (48 y.o. [name] Primary Care Provider: [name] Other Clinician: [name] Referring Provider: Treating Provider/Extender: [name] Weeks in Treatment: 51 Active Problems ICD-10 Encounter Code Description Active Date MDM Diagnosis E11.621 Type 2 diabetes mellitus with foot ulcer 03/16/2022 No Yes Lubas, Lavaya [name] ([id number]) [id number] Page 5 of 9 [phone] Non-pressure chronic ulcer of other part of left foot with other specified 03/16/2022 No Yes severity E11.42 Type 2 diabetes mellitus with diabetic polyneuropathy 03/16/2022 No Yes Z79.4 Long term (current) use of insulin 02/22/2023 No Yes N18.6 End stage renal disease 07/14/2023 No Yes Z99.2 Dependence on renal dialysis 07/14/2023 No Yes L84 Corns and callosities 08/01/2023 No Yes Inactive Problems Resolved Problems ICD-10 Code Description Active Date Resolved Date L97.512 Non-pressure chronic ulcer of other part of right foot with fat layer exposed 03/16/2022 03/16/2022 L97.811 Non-pressure chronic ulcer of other part of right lower leg limited to breakdown of skin 03/16/2022 03/16/2022 W54.0XXA Bitten by dog, initial encounter 05/17/2023 05/17/2023 S81.801A Unspecified  open wound, right lower leg, initial encounter 05/17/2023 05/17/2023 X32.355 Type 2 diabetes mellitus with other skin ulcer 05/17/2023 05/17/2023 Electronic Signature(s) Signed: 11/13/2023 8:56:49 AM By: [name] PA-C Entered By: [name] on 11/13/2023 08:56:49 -------------------------------------------------------------------------------- Progress Note Details Patient Name: Date of Service: [name] 11/13/2023 9:00 [name]: [id number] Patient Account Number: [id number] Date of Birth/Sex: Treating RN: 05-24-1976 (48 y.o. [name] Primary Care Provider: [name] Other Clinician: Sarita Haver, Seema [name] (  [id number]) [id number] Page 6 of 9 Referring Provider: Treating Provider/Extender: [name] in Treatment: 86 Subjective Chief Complaint Information obtained from Patient Left foot ulcer History of Present Illness (HPI) READMISSION 03/19/2021 This is [name] patient that we cared for in this clinic for [name] prolonged period of time in 2019 and 2020 with [name] left foot and left first toe wound. 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 Excell Seltzer. In December 2021 she had an ulcer on her right great toe as well as the fourth and fifth toes. 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 She also had an angiogram at that time and underwent angioplasty of the right anterior tibial artery. In any case she claims that the wound on the right foot is closed I did not look at this today which was probably an oversight although I think that should be done next week. 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 According to Dr. Bernette Redbird last review that she was out of the area being cared for by another physician but recently came  back to his attention. 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 [name] culture of this area showed E. coli apparently before she came back to see Dr. Excell Seltzer she was supposed to be receiving antibiotics but she did not really take them. Nor is she offloading this area at all. Finally her last hemoglobin A1c listed in epic was in March 2022 at 14.1 she says things are [name] lot better since then although I am not sure. 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 She had this wound at the time. During this admission she had E. coli sepsis [name] TEE was negative. 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 She is still [name] heavy cigarette smoker. She has [name] history of bipolar disorder seizure disorder ABI in our clinic was 1.05 11-13-23 upon evaluation today patient appears to be doing well currently in regard to her wound all things considered. Fortunately I do not see any signs of active infection which is great news. With that being said I do think that she though not infected still is having quite [name] bit of callus buildup she is up moving around with her foot [name] lot she tells me although she tries not to do any more than she has to. Nonetheless I think that there is just on an medical things that she is having new during the day they are keeping her moving fairly frequently. Objective Constitutional Well-nourished and well-hydrated in no acute distress. Vitals Time Taken: 9:11 AM, Height: 69 in, Weight: 178 lbs, BMI: 26.3, Temperature: 98.5 F, Pulse: 66 bpm, Respiratory Rate: 18 breaths/min, Blood Pressure: 116/72 mmHg. Respiratory normal breathing without  difficulty. Psychiatric this patient is able to make decisions and demonstrates good insight into disease process. Alert and Oriented x 3. pleasant and cooperative. General Notes: Upon inspection patient's wound bed actually showed signs of poor granulation epithelization at this point. I do believe that she is tolerating the cast but I do  not think the cast is working and functioning quite like it should I think her foot is extremely short and extremely small on top of the fact that her leg is somewhat small I discussed this with her she was in the cast prior to me taking over her care and to be honest she has come very close to healing with the cast but then everything just got worse. Nonetheless at this point I feel like that she may benefit from trying to continue this as I think it is probably better than nothing although she still is having some pretty significant issues here. T be honest I am worried about how this is going to play out. o Integumentary (Hair, Skin) Wound #12R status is Open. Original cause of wound was Pressure Injury. The date acquired was: 03/16/2020. The wound has been in treatment 86 weeks. The wound is located on the Left,Medial,Plantar Foot. The wound measures 1.7cm length x 0.9cm width x 0.2cm depth; 1.202cm^2 area and 0.24cm^3 volume. There is Fat Layer (Subcutaneous Tissue) exposed. There is [name] medium amount of serosanguineous drainage noted. The wound margin is flat and intact. There is medium (34-66%) pink granulation within the wound bed. There is no necrotic tissue within the wound bed. Assessment Active Problems ICD-10 Type 2 diabetes mellitus with foot ulcer Non-pressure chronic ulcer of other part of left foot with other specified severity Type 2 diabetes mellitus with diabetic polyneuropathy Long term (current) use of insulin End stage renal disease Dependence on renal dialysis Corns and callosities Tschantz, Jennafer [name] ([id number])  [id number] Page 7 of 9 Procedures Wound #12R Pre-procedure diagnosis of Wound #12R is [name] Diabetic Wound/Ulcer of the Lower Extremity located on the Left,Medial,Plantar Foot .Severity of Tissue Pre Debridement is: Fat layer exposed. There was [name] Excisional Skin/Subcutaneous Tissue Debridement with [name] total area of 1.2 sq cm performed by [name], PA-C. With the following instrument(s): Curette to remove Viable and Non-Viable tissue/material. Material removed includes Callus, Subcutaneous Tissue, and Slough. [name] time out was conducted at 09:47, prior to the start of the procedure. [name] Minimum amount of bleeding was controlled with Pressure. The procedure was tolerated well. Post Debridement Measurements: 1.7cm length x 0.9cm width x 0.2cm depth; 0.24cm^3 volume. Character of Wound/Ulcer Post Debridement is stable. Severity of Tissue Post Debridement is: Fat layer exposed. Post procedure Diagnosis Wound #12R: Same as Pre-Procedure Pre-procedure diagnosis of Wound #12R is [name] Diabetic Wound/Ulcer of the Lower Extremity located on the Left,Medial,Plantar Foot . There was [name] T Contact otal Cast Procedure by [name], PA-C. Post procedure Diagnosis Wound #12R: Same as Pre-Procedure Plan Follow-up Appointments: Return Appointment in 1 week. Bathing/ Shower/ Hygiene: May shower with wound dressing protected with water repellent cover or cast protector. No tub bath. Anesthetic (Use 'Patient Medications' Section for Anesthetic Order Entry): Lidocaine applied to wound bed - as needed Off-Loading: T Contact Cast to Left Lower Extremity - applied today 11/13/2023 otal Open toe surgical shoe - right foot WOUND #12R: - Foot Wound Laterality: Plantar, Left, Medial Cleanser: Vashe 5.8 (oz) 1 x Per Week/30 Days Discharge Instructions: Use vashe 5.8 (oz) as directed Topical: Mupirocin Ointment 1 x Per Week/30 Days Discharge Instructions: Apply as directed by provider. Prim  Dressing: Silvercel Small 2x2 (in/in) 1 x Per Week/30 Days ary Discharge Instructions: Apply Silvercel Small 2x2 (in/in) as instructed Secondary Dressing: Gauze 1 x Per Week/30 Days Discharge Instructions: As directed: dry, moistened with saline or moistened with Dakins Solution Secured With: Medipore T - 20M Medipore  H Soft Cloth Surgical T ape ape, 2x2 (in/yd) 1 x Per Week/30 Days Secured With: [employer] Sterile or Non-Sterile 6-ply 4.5x4 (yd/yd) 1 x Per Week/30 Days Discharge Instructions: Apply Kerlix as directed 1. My suggestion currently is gena be that we go ahead and have the patient continue to monitor for any signs of infection or worsening. I believe that she is actually doing pretty well in regard to her wound. 2. I am going to recommend as well the patient should continue to utilize the mupirocin ointment. I think that this is really doing quite well currently. 3. I am going to recommend that the patient should continue to utilize silver cell over the wound and then subsequently were going to use the roll gauze to secure in place and then we are using the total contact casting as well. We will see patient back for reevaluation in 1 week here in the clinic. If anything worsens or changes patient will contact our office for additional recommendations. Electronic Signature(s) Signed: 11/13/2023 2:39:42 PM By: [name] PA-C Previous Signature: 11/13/2023 2:38:01 PM Version By: [name] PA-C Entered By: [name] on 11/13/2023 14:39:42 [name] ([id number]) [id number] Page 8 of 9 -------------------------------------------------------------------------------- Total Contact Cast Details Patient Name: Date of Service: LOISTEEN, MUKHERJEE 11/13/2023 9:00 [name]: [id number] Patient Account Number: [id number] Date of Birth/Sex: Treating RN: January 03, 1976 (48 y.o. [name] Primary Care Provider: [name] Other  Clinician: [name] Referring Provider: Treating Provider/Extender: [name] in Treatment: 58 T Contact Cast Applied for Wound Assessment: otal Wound #12R Left,Medial,Plantar Foot Performed By: Physician [name], PA-C The following information was scribed by: [name] The information was scribed for: [name] Post Procedure Diagnosis Same as Pre-procedure Electronic Signature(s) Signed: 11/13/2023 5:18:10 PM By: [name] PA-C Signed: 11/14/2023 5:15:40 PM By: [name] Entered By: [name] on 11/13/2023 09:52:47 -------------------------------------------------------------------------------- SuperBill Details Patient Name: Date of Service: [name] 11/13/2023 Medical Record Number: [id number] Patient Account Number: [id number] Date of Birth/Sex: Treating RN: 01-09-1976 (47 y.o. [name] Primary Care Provider: [name] Other Clinician: [name] Referring Provider: Treating Provider/Extender: [name] Weeks in Treatment: 86 Diagnosis Coding ICD-10 Codes Code Description E11.621 Type 2 diabetes mellitus with foot ulcer L97.528 Non-pressure chronic ulcer of other part of left foot with other specified severity E11.42 Type 2 diabetes mellitus with diabetic polyneuropathy Z79.4 Long term (current) use of insulin N18.6 End stage renal disease Z99.2 Dependence on renal dialysis L84 Corns and callosities Facility Procedures : CPT4 Code: [id number] Description: 11042 - DEB SUBQ TISSUE 20 SQ CM/< ICD-10 Diagnosis Description L97.528 Non-pressure chronic ulcer of other part of left foot with other specified seve Modifier: rity Quantity: 1 Physician Procedures : CPT4 Code Description Modifier 11042 11042 - WC PHYS SUBQ TISS 20 SQ CM ICD-10 Diagnosis Description L97.528 Non-pressure chronic ulcer of other part of left foot with other specified severity Swindle, Imoni [name] ([id number])   [id number] Quantity: 1 Page 9 of 9 Electronic Signature(s) Signed: 11/13/2023 2:40:04 PM By: [name] PA-C Entered By: [name] on 11/13/2023 14:40:03

## 2023-11-20 ENCOUNTER — Encounter: Payer: MEDICAID | Admitting: Physician Assistant

## 2023-11-20 DIAGNOSIS — E11621 Type 2 diabetes mellitus with foot ulcer: Secondary | ICD-10-CM | POA: Diagnosis not present

## 2023-11-29 ENCOUNTER — Encounter: Payer: MEDICAID | Attending: Physician Assistant | Admitting: Physician Assistant

## 2023-11-29 DIAGNOSIS — I43 Cardiomyopathy in diseases classified elsewhere: Secondary | ICD-10-CM | POA: Insufficient documentation

## 2023-11-29 DIAGNOSIS — E785 Hyperlipidemia, unspecified: Secondary | ICD-10-CM | POA: Insufficient documentation

## 2023-11-29 DIAGNOSIS — N186 End stage renal disease: Secondary | ICD-10-CM | POA: Diagnosis not present

## 2023-11-29 DIAGNOSIS — E11621 Type 2 diabetes mellitus with foot ulcer: Secondary | ICD-10-CM | POA: Diagnosis present

## 2023-11-29 DIAGNOSIS — I1311 Hypertensive heart and chronic kidney disease without heart failure, with stage 5 chronic kidney disease, or end stage renal disease: Secondary | ICD-10-CM | POA: Insufficient documentation

## 2023-11-29 DIAGNOSIS — F17210 Nicotine dependence, cigarettes, uncomplicated: Secondary | ICD-10-CM | POA: Diagnosis not present

## 2023-11-29 DIAGNOSIS — Z794 Long term (current) use of insulin: Secondary | ICD-10-CM | POA: Diagnosis not present

## 2023-11-29 DIAGNOSIS — Z89412 Acquired absence of left great toe: Secondary | ICD-10-CM | POA: Diagnosis not present

## 2023-11-29 DIAGNOSIS — L97528 Non-pressure chronic ulcer of other part of left foot with other specified severity: Secondary | ICD-10-CM | POA: Insufficient documentation

## 2023-11-29 DIAGNOSIS — E1142 Type 2 diabetes mellitus with diabetic polyneuropathy: Secondary | ICD-10-CM | POA: Diagnosis not present

## 2023-11-29 DIAGNOSIS — Z992 Dependence on renal dialysis: Secondary | ICD-10-CM | POA: Diagnosis not present

## 2023-11-29 DIAGNOSIS — L84 Corns and callosities: Secondary | ICD-10-CM | POA: Diagnosis not present

## 2023-11-29 DIAGNOSIS — E1122 Type 2 diabetes mellitus with diabetic chronic kidney disease: Secondary | ICD-10-CM | POA: Insufficient documentation

## 2023-12-04 ENCOUNTER — Encounter: Payer: MEDICAID | Admitting: Physician Assistant

## 2023-12-04 DIAGNOSIS — E11621 Type 2 diabetes mellitus with foot ulcer: Secondary | ICD-10-CM | POA: Diagnosis not present

## 2023-12-11 ENCOUNTER — Encounter: Payer: MEDICAID | Admitting: Physician Assistant

## 2023-12-11 DIAGNOSIS — E11621 Type 2 diabetes mellitus with foot ulcer: Secondary | ICD-10-CM | POA: Diagnosis not present

## 2023-12-18 ENCOUNTER — Encounter: Payer: MEDICAID | Admitting: Physician Assistant

## 2023-12-18 DIAGNOSIS — E11621 Type 2 diabetes mellitus with foot ulcer: Secondary | ICD-10-CM | POA: Diagnosis not present

## 2023-12-25 ENCOUNTER — Encounter: Payer: MEDICAID | Attending: Physician Assistant | Admitting: Physician Assistant

## 2023-12-25 DIAGNOSIS — Z992 Dependence on renal dialysis: Secondary | ICD-10-CM | POA: Diagnosis not present

## 2023-12-25 DIAGNOSIS — E1122 Type 2 diabetes mellitus with diabetic chronic kidney disease: Secondary | ICD-10-CM | POA: Diagnosis not present

## 2023-12-25 DIAGNOSIS — L84 Corns and callosities: Secondary | ICD-10-CM | POA: Insufficient documentation

## 2023-12-25 DIAGNOSIS — E1142 Type 2 diabetes mellitus with diabetic polyneuropathy: Secondary | ICD-10-CM | POA: Diagnosis not present

## 2023-12-25 DIAGNOSIS — E11621 Type 2 diabetes mellitus with foot ulcer: Secondary | ICD-10-CM | POA: Insufficient documentation

## 2023-12-25 DIAGNOSIS — N186 End stage renal disease: Secondary | ICD-10-CM | POA: Insufficient documentation

## 2023-12-25 DIAGNOSIS — Z794 Long term (current) use of insulin: Secondary | ICD-10-CM | POA: Diagnosis not present

## 2023-12-25 DIAGNOSIS — L97528 Non-pressure chronic ulcer of other part of left foot with other specified severity: Secondary | ICD-10-CM | POA: Diagnosis not present

## 2024-01-01 ENCOUNTER — Encounter: Payer: MEDICAID | Admitting: Physician Assistant

## 2024-01-01 DIAGNOSIS — E11621 Type 2 diabetes mellitus with foot ulcer: Secondary | ICD-10-CM | POA: Diagnosis not present

## 2024-01-08 ENCOUNTER — Encounter: Payer: MEDICAID | Admitting: Physician Assistant

## 2024-01-08 DIAGNOSIS — E11621 Type 2 diabetes mellitus with foot ulcer: Secondary | ICD-10-CM | POA: Diagnosis not present

## 2024-01-15 ENCOUNTER — Encounter: Payer: MEDICAID | Admitting: Physician Assistant

## 2024-01-15 DIAGNOSIS — E11621 Type 2 diabetes mellitus with foot ulcer: Secondary | ICD-10-CM | POA: Diagnosis not present

## 2024-01-22 ENCOUNTER — Emergency Department
Admission: EM | Admit: 2024-01-22 | Discharge: 2024-01-22 | Disposition: A | Discharge status: Home / Self Care | Payer: MEDICAID | Attending: Emergency Medicine | Admitting: Emergency Medicine

## 2024-01-22 ENCOUNTER — Emergency Department: Payer: MEDICAID

## 2024-01-22 ENCOUNTER — Other Ambulatory Visit: Payer: Self-pay

## 2024-01-22 DIAGNOSIS — I82432 Acute embolism and thrombosis of left popliteal vein: Secondary | ICD-10-CM | POA: Diagnosis not present

## 2024-01-22 DIAGNOSIS — M79662 Pain in left lower leg: Secondary | ICD-10-CM | POA: Diagnosis present

## 2024-01-22 DIAGNOSIS — N939 Abnormal uterine and vaginal bleeding, unspecified: Secondary | ICD-10-CM | POA: Insufficient documentation

## 2024-01-22 DIAGNOSIS — E119 Type 2 diabetes mellitus without complications: Secondary | ICD-10-CM | POA: Diagnosis not present

## 2024-01-22 DIAGNOSIS — I10 Essential (primary) hypertension: Secondary | ICD-10-CM | POA: Diagnosis not present

## 2024-01-22 LAB — LACTIC ACID, PLASMA
Lactic Acid, Venous: 2 mmol/L (ref 0.5–1.9)
Lactic Acid, Venous: 1.9 mmol/L (ref 0.5–1.9)

## 2024-01-22 LAB — CBC
HCT: 31.1 % — ABNORMAL LOW (ref 36.0–46.0)
Hemoglobin: 10.3 g/dL — ABNORMAL LOW (ref 12.0–15.0)
MCH: 32.2 pg (ref 26.0–34.0)
MCHC: 33.1 g/dL (ref 30.0–36.0)
MCV: 97.2 fL (ref 80.0–100.0)
Platelets: 306 10*3/uL (ref 150–400)
RBC: 3.2 MIL/uL — ABNORMAL LOW (ref 3.87–5.11)
RDW: 13.4 % (ref 11.5–15.5)
WBC: 8.3 10*3/uL (ref 4.0–10.5)
nRBC: 0 % (ref 0.0–0.2)

## 2024-01-22 LAB — BASIC METABOLIC PANEL WITH GFR
Anion gap: 13 (ref 5–15)
BUN: 30 mg/dL — ABNORMAL HIGH (ref 6–20)
CO2: 25 mmol/L (ref 22–32)
Calcium: 8.5 mg/dL — ABNORMAL LOW (ref 8.9–10.3)
Chloride: 100 mmol/L (ref 98–111)
Creatinine, Ser: 3.78 mg/dL — ABNORMAL HIGH (ref 0.44–1.00)
GFR, Estimated: 14 mL/min — ABNORMAL LOW (ref >60)
Glucose, Bld: 138 mg/dL — ABNORMAL HIGH (ref 70–99)
Potassium: 3.3 mmol/L — ABNORMAL LOW (ref 3.5–5.1)
Sodium: 138 mmol/L (ref 135–145)

## 2024-01-22 LAB — HCG, QUANTITATIVE, PREGNANCY: hCG, Beta Chain, Quant, S: 1 m[IU]/mL (ref <5)

## 2024-01-22 MED ORDER — APIXABAN 5 MG PO TABS
10.0000 mg | ORAL_TABLET | Freq: Once | ORAL | Status: AC
  Administered 2024-01-22: 10 mg via ORAL
  Filled 2024-01-22: qty 2

## 2024-01-22 MED ORDER — APIXABAN (ELIQUIS) VTE STARTER PACK (10MG AND 5MG): ORAL_TABLET | ORAL | 0 refills | Status: DC

## 2024-01-22 NOTE — ED Notes (Signed)
 Pt aox4 LLE amputation of all toes noted to have open healing wound to soul of left foot pt reports acute severe searing pain upper left calf with weight bearing unknown origin skin of LLE appears wdi pink warm to palpation

## 2024-01-22 NOTE — Discharge Instructions (Addendum)
 I have sent the medication to your pharmacy which is a blood thinner and take as prescribed for your blood clot which is behind the left knee.  Please follow-up with the vascular surgery team for outpatient management.  You are also found to have some thickening of the cervix on the ultrasound of your pelvis.  You will need to follow-up with gynecology for additional imaging such as MRI outpatient and possibly pelvic exam as well.  Please ensure you follow-up with them or your primary care provider for ongoing evaluation.  Please return for any worsening symptoms and keep following along with your wound care team for the ulcer.

## 2024-01-22 NOTE — ED Provider Notes (Signed)
 [facility] Provider Note    Event Date/Time   First MD Initiated Contact with Patient 01/22/24 1720     (approximate)   History   Wound Infection   HPI [name] is a 48 y.o. female with history of DM2, prior osteomyelitis to feet and toes, HFrEF, HLD, HTN presenting today for multiple complaints.  Patient states she primarily came in for worsening pain and swelling in her left lower extremity.  She has a chronic wound which is managed by wound care outpatient and they have noted it to be slightly worse recently.  No surrounding redness but is now having pain and swelling in the left lower extremity up to just below her knee.  She denies any fevers or chills.  Has not been on antibiotics within the past month for this.  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  Separately, she has also been having vaginal bleeding for the past 14 days.  She states it has been many years since she has had a period and this was new onset.  No specific severe pain in the lower abdomen.     Physical Exam   Triage Vital Signs: ED Triage Vitals  Encounter Vitals Group     BP 01/22/24 1414 109/64     Systolic BP Percentile --      Diastolic BP Percentile --      Pulse Rate 01/22/24 1412 (!) 109     Resp 01/22/24 1412 19     Temp 01/22/24 1412 98 F (36.7 C)     Temp src --      SpO2 01/22/24 1414 93 %     Weight 01/22/24 1412 135 lb (61.2 kg)     Height 01/22/24 1412 5\' 9"  (1.753 m)     Head Circumference --      Peak Flow --      Pain Score 01/22/24 1411 10     Pain Loc --      Pain Education --      Exclude from Growth Chart --     Most recent vital signs: Vitals:   01/22/24 1414 01/22/24 1834  BP: 109/64 (!) 148/89  Pulse:  85  Resp:  18  Temp:  98.1 F (36.7 C)  SpO2: 93% 98%   Physical Exam: I have reviewed the vital signs and nursing notes. General: Awake, alert, no acute distress.  Nontoxic appearing. Head:  Atraumatic,  normocephalic.   ENT:  EOM intact, PERRL. Oral mucosa is pink and moist with no lesions. Neck: Neck is supple with full range of motion, No meningeal signs. Cardiovascular:  RRR, No murmurs. Peripheral pulses palpable and equal bilaterally. Respiratory:  Symmetrical chest wall expansion.  No rhonchi, rales, or wheezes.  Good air movement throughout.  No use of accessory muscles.   Musculoskeletal:  No cyanosis.  Nonpitting edema to left lower extremity up to the level of the mid calf.  No swelling to right lower extremity.  No erythema.  Mild tenderness palpation on the left lower extremity.  Moving extremities with full ROM Abdomen:  Soft, nontender, nondistended. Neuro:  GCS 15, moving all four extremities, interacting appropriately. Speech clear. Psych:  Calm, appropriate.   Skin: Chronic wound to left heel with no specific surrounding erythema but does have drainage and foul smell.  See media tab   ED Results / Procedures / Treatments   Labs (all labs ordered are listed, but only abnormal results  are displayed) Labs Reviewed  BASIC METABOLIC PANEL WITH GFR - Abnormal; Notable for the following components:      Result Value   Potassium 3.3 (*)    Glucose, Bld 138 (*)    BUN 30 (*)    Creatinine, Ser 3.78 (*)    Calcium 8.5 (*)    GFR, Estimated 14 (*)    All other components within normal limits  LACTIC ACID, PLASMA - Abnormal; Notable for the following components:   Lactic Acid, Venous 2.0 (*)    All other components within normal limits  CBC - Abnormal; Notable for the following components:   RBC 3.20 (*)    Hemoglobin 10.3 (*)    HCT 31.1 (*)    All other components within normal limits  LACTIC ACID, PLASMA  HCG, QUANTITATIVE, PREGNANCY     EKG    RADIOLOGY Independently evaluated x-ray with no acute pathology of the left foot.  Independently evaluated ultrasound of left lower extremity with nonocclusive popliteal DVT.  Evaluated transvaginal ultrasound with some  endocervical enhancement but no other acute focal pathology.   PROCEDURES:  Critical Care performed: No  Procedures   MEDICATIONS ORDERED IN ED: Medications  apixaban (ELIQUIS) tablet 10 mg (has no administration in time range)     IMPRESSION / MDM / ASSESSMENT AND PLAN / ED COURSE  I reviewed the triage vital signs and the nursing notes.                              Differential diagnosis includes, but is not limited to, diabetic wound infection, chronic ulcer, osteomyelitis, DVT, fibroma, adenoma  Patient's presentation is most consistent with acute complicated illness / injury requiring diagnostic workup.  Patient is a 48 year old female presenting today for multiple complaints.  On 1 hand she has a chronic wound to her left heel where she has plan to see podiatry outpatient to discuss potential further amputation.  There is no surrounding erythema but she feels it looks worse in the wound care team's told her that as well.  No surrounding erythema.  Does have some swelling to the left lower extremity so we will evaluate for infection versus blood clot.  Separately, for her vaginal bleeding she is nontender in the lower abdomen but will get transvaginal ultrasound for further evaluation.  History of being on dialysis but BMP otherwise unremarkable.  Lactic acid normal.  CBC with no leukocytosis and hemoglobin rather stable with her baseline.  X-ray of left foot negative for osteomyelitis.  No signs of infection either on exam.  Ultrasound shows evidence of nonocclusive left popliteal DVT.  Discussed the case with vascular surgery given her being on dialysis.  He still recommends full dose Eliquis starting regimen.  Follow-up outpatient vascular clinic.  She is already following up with them for possible amputation of her left lower extremity anyways.  Separately, for vaginal bleeding, ultrasound shows some cervical enhancement.  Her bleeding is not present at this time and I feel she can  get additional workup outpatient with the gynecology team which will include MRI or direct visualization.  Patient agreeable with this plan and will follow-up with both vascular surgery and gynecology outpatient.  Given strict return precautions and agreeable with plan.  The patient is on the cardiac monitor to evaluate for evidence of arrhythmia and/or significant heart rate changes. Clinical Course as of 01/22/24 2130  Mon Jan 22, 2024  1909 Non-occlusive left popliteal  DVT [DW]  1937 DG Foot Complete Left No obvious osteomyelitis [DW]  2109 Vascular - full dose eliquis is fine. No change for dialysis. Otherwise safe for discharge [DW]    Clinical Course User Index [DW] Janith Lima, MD     FINAL CLINICAL IMPRESSION(S) / ED DIAGNOSES   Final diagnoses:  Acute deep vein thrombosis (DVT) of popliteal vein of left lower extremity (HCC)  Vaginal bleeding     Rx / DC Orders   ED Discharge Orders          Ordered    APIXABAN (ELIQUIS) VTE STARTER PACK (10MG  AND 5MG )       Note to Pharmacy: If starter pack unavailable, substitute with seventy-four 5 mg apixaban tabs following the above SIG directions.   01/22/24 2129    Ambulatory referral to Gynecology       Comments: Vaginal bleeding with endocervix enhancement.  Further outpatient workup   01/22/24 2129             Note:  This document was prepared using Dragon voice recognition software and may include unintentional dictation errors.   Janith Lima, MD 01/22/24 2132

## 2024-01-22 NOTE — ED Triage Notes (Addendum)
 Pt comes with c/o left leg pain. Pt states she has wound infection to foot for several years. Pt has all toe amputations. Pt states they are getting ready to take her leg off.   Pt also states diarrhea, belly pain and has been having vaginal bleeding for awhile now. Pt states she had not had period in about 16 years til now.   Pt states she also has dialysis T,TH, Sat

## 2024-01-22 NOTE — ED Notes (Signed)
 Pt assisted to personal vehicle via wc pt remains aox4 speaking in full clear sentences appears in nad denies further needs @ this time

## 2024-01-24 ENCOUNTER — Other Ambulatory Visit: Payer: Self-pay

## 2024-01-24 ENCOUNTER — Ambulatory Visit: Payer: MEDICAID | Admitting: Physician Assistant

## 2024-01-24 ENCOUNTER — Emergency Department
Admission: EM | Admit: 2024-01-24 | Discharge: 2024-01-24 | Disposition: A | Discharge status: Home / Self Care | Payer: MEDICAID

## 2024-01-24 ENCOUNTER — Emergency Department: Payer: MEDICAID

## 2024-01-24 DIAGNOSIS — E1122 Type 2 diabetes mellitus with diabetic chronic kidney disease: Secondary | ICD-10-CM | POA: Insufficient documentation

## 2024-01-24 DIAGNOSIS — R0602 Shortness of breath: Secondary | ICD-10-CM

## 2024-01-24 DIAGNOSIS — I132 Hypertensive heart and chronic kidney disease with heart failure and with stage 5 chronic kidney disease, or end stage renal disease: Secondary | ICD-10-CM | POA: Diagnosis not present

## 2024-01-24 DIAGNOSIS — Z992 Dependence on renal dialysis: Secondary | ICD-10-CM | POA: Insufficient documentation

## 2024-01-24 DIAGNOSIS — J449 Chronic obstructive pulmonary disease, unspecified: Secondary | ICD-10-CM | POA: Insufficient documentation

## 2024-01-24 DIAGNOSIS — I509 Heart failure, unspecified: Secondary | ICD-10-CM | POA: Insufficient documentation

## 2024-01-24 DIAGNOSIS — N186 End stage renal disease: Secondary | ICD-10-CM | POA: Diagnosis not present

## 2024-01-24 DIAGNOSIS — R0789 Other chest pain: Secondary | ICD-10-CM | POA: Diagnosis not present

## 2024-01-24 DIAGNOSIS — R079 Chest pain, unspecified: Secondary | ICD-10-CM

## 2024-01-24 LAB — CBC WITH DIFFERENTIAL/PLATELET
Abs Immature Granulocytes: 0.02 10*3/uL (ref 0.00–0.07)
Basophils Absolute: 0.1 10*3/uL (ref 0.0–0.1)
Basophils Relative: 1 %
Eosinophils Absolute: 0.1 10*3/uL (ref 0.0–0.5)
Eosinophils Relative: 1 %
HCT: 32 % — ABNORMAL LOW (ref 36.0–46.0)
Hemoglobin: 10.5 g/dL — ABNORMAL LOW (ref 12.0–15.0)
Immature Granulocytes: 0 %
Lymphocytes Relative: 13 %
Lymphs Abs: 0.9 10*3/uL (ref 0.7–4.0)
MCH: 32.2 pg (ref 26.0–34.0)
MCHC: 32.8 g/dL (ref 30.0–36.0)
MCV: 98.2 fL (ref 80.0–100.0)
Monocytes Absolute: 0.4 10*3/uL (ref 0.1–1.0)
Monocytes Relative: 6 %
Neutro Abs: 5.7 10*3/uL (ref 1.7–7.7)
Neutrophils Relative %: 79 %
Platelets: 289 10*3/uL (ref 150–400)
RBC: 3.26 MIL/uL — ABNORMAL LOW (ref 3.87–5.11)
RDW: 13.4 % (ref 11.5–15.5)
WBC: 7.1 10*3/uL (ref 4.0–10.5)
nRBC: 0 % (ref 0.0–0.2)

## 2024-01-24 LAB — COMPREHENSIVE METABOLIC PANEL WITH GFR
ALT: 17 U/L (ref 0–44)
AST: 24 U/L (ref 15–41)
Albumin: 4 g/dL (ref 3.5–5.0)
Alkaline Phosphatase: 102 U/L (ref 38–126)
Anion gap: 14 (ref 5–15)
BUN: 22 mg/dL — ABNORMAL HIGH (ref 6–20)
CO2: 26 mmol/L (ref 22–32)
Calcium: 9 mg/dL (ref 8.9–10.3)
Chloride: 99 mmol/L (ref 98–111)
Creatinine, Ser: 2.94 mg/dL — ABNORMAL HIGH (ref 0.44–1.00)
GFR, Estimated: 19 mL/min — ABNORMAL LOW (ref >60)
Glucose, Bld: 147 mg/dL — ABNORMAL HIGH (ref 70–99)
Potassium: 3.3 mmol/L — ABNORMAL LOW (ref 3.5–5.1)
Sodium: 139 mmol/L (ref 135–145)
Total Bilirubin: 1.1 mg/dL (ref 0.0–1.2)
Total Protein: 8.5 g/dL — ABNORMAL HIGH (ref 6.5–8.1)

## 2024-01-24 LAB — RESP PANEL BY RT-PCR (RSV, FLU A&B, COVID)  RVPGX2
Influenza A by PCR: NEGATIVE
Influenza B by PCR: NEGATIVE
Resp Syncytial Virus by PCR: NEGATIVE
SARS Coronavirus 2 by RT PCR: NEGATIVE

## 2024-01-24 LAB — BLOOD GAS, VENOUS
Acid-Base Excess: 8.8 mmol/L — ABNORMAL HIGH (ref 0.0–2.0)
Bicarbonate: 34.6 mmol/L — ABNORMAL HIGH (ref 20.0–28.0)
O2 Saturation: 66 %
Patient temperature: 37
pCO2, Ven: 51 mmHg (ref 44–60)
pH, Ven: 7.44 — ABNORMAL HIGH (ref 7.25–7.43)
pO2, Ven: 35 mmHg (ref 32–45)

## 2024-01-24 LAB — TROPONIN I (HIGH SENSITIVITY)
Troponin I (High Sensitivity): 14 ng/L (ref <18)
Troponin I (High Sensitivity): 15 ng/L (ref <18)

## 2024-01-24 LAB — LIPASE, BLOOD: Lipase: 28 U/L (ref 11–51)

## 2024-01-24 LAB — BRAIN NATRIURETIC PEPTIDE: B Natriuretic Peptide: 603.4 pg/mL — ABNORMAL HIGH (ref 0.0–100.0)

## 2024-01-24 LAB — PROTIME-INR
INR: 1.7 — ABNORMAL HIGH (ref 0.8–1.2)
Prothrombin Time: 20.1 s — ABNORMAL HIGH (ref 11.4–15.2)

## 2024-01-24 MED ORDER — ACETAMINOPHEN 500 MG PO TABS
1000.0000 mg | ORAL_TABLET | Freq: Once | ORAL | Status: AC
  Administered 2024-01-24: 1000 mg via ORAL
  Filled 2024-01-24: qty 2

## 2024-01-24 MED ORDER — OXYCODONE-ACETAMINOPHEN 5-325 MG PO TABS
1.0000 | ORAL_TABLET | Freq: Once | ORAL | Status: AC
  Administered 2024-01-24: 1 via ORAL
  Filled 2024-01-24: qty 1

## 2024-01-24 MED ORDER — LIDOCAINE 5 % EX PTCH
1.0000 | MEDICATED_PATCH | CUTANEOUS | Status: DC
  Administered 2024-01-24: 1 via TRANSDERMAL
  Filled 2024-01-24: qty 1

## 2024-01-24 MED ORDER — IOHEXOL 350 MG/ML SOLN
75.0000 mL | Freq: Once | INTRAVENOUS | Status: AC | PRN
  Administered 2024-01-24: 75 mL via INTRAVENOUS

## 2024-01-24 MED ORDER — OXYCODONE-ACETAMINOPHEN 5-325 MG PO TABS: 1.0000 | ORAL_TABLET | Freq: Four times a day (QID) | ORAL | 0 refills | Status: AC | PRN

## 2024-01-24 MED ORDER — IPRATROPIUM-ALBUTEROL 0.5-2.5 (3) MG/3ML IN SOLN
3.0000 mL | Freq: Once | RESPIRATORY_TRACT | Status: AC
  Administered 2024-01-24: 3 mL via RESPIRATORY_TRACT
  Filled 2024-01-24: qty 3

## 2024-01-24 NOTE — ED Notes (Signed)
 Patient transported to CT

## 2024-01-24 NOTE — ED Notes (Signed)
 Pt able to ambulate from bed to toilet with walker without incident.

## 2024-01-24 NOTE — ED Notes (Signed)
 ED Provider at bedside.

## 2024-01-24 NOTE — ED Provider Notes (Signed)
 [facility] Provider Note    Event Date/Time   First MD Initiated Contact with Patient 01/24/24 1212     (approximate)   History   Shortness of Breath   HPI  [name] is a 48 y.o. female  pmh COPD, multisubstance use, seizures, HTN, combined CHF, DM, ESRD on dialysis (Tuesday, Thursday, Saturday, last dialysis session yesterday presents for evaluation of shortness of breath -Patient is somewhat limited historian but states she started feeling short of breath today -EMS believes she may have been satting in the 70s on their arrival -Recently diagnosed with a DVT in her left lower extremity, started on Eliquis 2 days ago -Is complaining of severe right-sided chest pain, no preceding trauma     Physical Exam   Triage Vital Signs: ED Triage Vitals [01/24/24 1214]  Encounter Vitals Group     BP 112/71     Systolic BP Percentile      Diastolic BP Percentile      Pulse Rate 81     Resp (!) 28     Temp      Temp src      SpO2 100 %     Weight      Height      Head Circumference      Peak Flow      Pain Score      Pain Loc      Pain Education      Exclude from Growth Chart     Most recent vital signs: Vitals:   01/24/24 1408 01/24/24 1449  BP:    Pulse:    Resp:    Temp:  98.7 F (37.1 C)  SpO2: 96%      General: Awake, moderate distress CV:  Good peripheral perfusion.  RP 2+, regular rate and rhythm.  Tender to palpation right anterior lateral chest, no overlying skin changes.  Dialysis access to right chest. Resp:  Tachypneic.  Good airflow bilaterally.  Mild expiratory wheezing.  No focal coarse breath sounds appreciated. Abd:  No distention.  Palpation.    ED Results / Procedures / Treatments   Labs (all labs ordered are listed, but only abnormal results are displayed) Labs Reviewed  PROTIME-INR - Abnormal; Notable for the following components:      Result Value   Prothrombin Time 20.1 (*)    INR 1.7 (*)    All  other components within normal limits  COMPREHENSIVE METABOLIC PANEL WITH GFR - Abnormal; Notable for the following components:   Potassium 3.3 (*)    Glucose, Bld 147 (*)    BUN 22 (*)    Creatinine, Ser 2.94 (*)    Total Protein 8.5 (*)    GFR, Estimated 19 (*)    All other components within normal limits  BRAIN NATRIURETIC PEPTIDE - Abnormal; Notable for the following components:   B Natriuretic Peptide 603.4 (*)    All other components within normal limits  BLOOD GAS, VENOUS - Abnormal; Notable for the following components:   pH, Ven 7.44 (*)    Bicarbonate 34.6 (*)    Acid-Base Excess 8.8 (*)    All other components within normal limits  CBC WITH DIFFERENTIAL/PLATELET - Abnormal; Notable for the following components:   RBC 3.26 (*)    Hemoglobin 10.5 (*)    HCT 32.0 (*)    All other components within normal limits  RESP PANEL BY RT-PCR (RSV, FLU A&B, COVID)  RVPGX2  LIPASE, BLOOD  CBC WITH DIFFERENTIAL/PLATELET  TROPONIN I (HIGH SENSITIVITY)  TROPONIN I (HIGH SENSITIVITY)     EKG  Ecg = sinus rhythm, no ST elevation depression, no significant repolarization abnormality, normal axis, normal intervals.  No evidence of acute ischemia my read.   RADIOLOGY Chest x-ray reviewed, unremarkable on my interpretation.  Radiology report reviewed.  PROCEDURES:  Critical Care performed: No  Procedures   MEDICATIONS ORDERED IN ED: Medications  lidocaine (LIDODERM) 5 % 1 patch (1 patch Transdermal Patch Applied 01/24/24 1447)  ipratropium-albuterol (DUONEB) 0.5-2.5 (3) MG/3ML nebulizer solution 3 mL (3 mLs Nebulization Given 01/24/24 1252)  acetaminophen (TYLENOL) tablet 1,000 mg (1,000 mg Oral Given 01/24/24 1447)  iohexol (OMNIPAQUE) 350 MG/ML injection 75 mL (75 mLs Intravenous Contrast Given 01/24/24 1538)     IMPRESSION / MDM / ASSESSMENT AND PLAN / ED COURSE  I reviewed the triage vital signs and the nursing notes.                              Differential diagnosis  includes, but is not limited to, pulmonary embolism, pneumonia, consider COPD exacerbation, consider hypervolemia in the setting of a dialysis patient but less likely given that she had a dialysis session tomorrow.  No findings to suggest flash pulmonary edema/hypertensive emergency.  Consider ACS, anxiety.  Doubt underlying fracture or pneumothorax.  Will plan for broad workup, DuoNebs, trial of supplemental oxygen, imaging, EKG.  Patient's presentation is most consistent with acute presentation with potential threat to life or bodily function.  The patient is on the cardiac monitor to evaluate for evidence of arrhythmia and/or significant heart rate changes.  ED course below.  Patient breathing comfortably here in emergency department after single round of DuoNeb treatments.  Does still complain of severe right-sided chest pain.  Given recently diagnosed DVT I do have concern for PE.  Discussed imaging with radiology and nephrology, amenable to CT PE--CT PE ordered, results pending.  No recurrent hypoxia, suspect patient may have had possible poor waveform with EMS or just responded very well to single DuoNeb treatment.  Patient signed out to oncoming ED provider Dr. Maebelle Munroe pending results of CT PE.  If unremarkable, no evidence of any ongoing concerning pathology at this time and patient is feeling much better with stable vital signs.  Recommend discharge home with plan for home nebulizer treatments, can also consider steroids or antibiotics for possible COPD exacerbation.    Clinical Course as of 01/24/24 1541  Wed Jan 24, 2024  1240 Chest x-ray interpreted by myself, no obvious pathology [MM]  1240 Ecg = sinus rhythm, rate 71, no ST elevation or depression, no significant repolarization abnormality, normal axis, normal intervals.  No clear evidence of ischemia on my read. [MM]  1256 Patient reevaluated, now satting well on room air --will continue to monitor.  Speaking full sentences.   Continues complaining of right chest pain. [MM]  1307 BNP elevated in the 600s, no prior for comparison [MM]  1318 CMP reviewed, no hyperkalemia [MM]  1319 Troponin normal [MM]  1319 VBG reassuring, alkalosis, elevated pCO2 [MM]  1319 Viral swab negative [MM]  1410 CBC with no leukocytosis, stable anemia [MM]  1421 Formal CXR: IMPRESSION: No active disease.   [MM]  1458 Rpt trop stable [MM]    Clinical Course User Index [MM] Marinell Blight, MD     FINAL CLINICAL IMPRESSION(S) / ED DIAGNOSES   Final diagnoses:  SOB (shortness of breath)  Rx / DC Orders   ED Discharge Orders     None        Note:  This document was prepared using Dragon voice recognition software and may include unintentional dictation errors.   Marinell Blight, MD 01/24/24 [phone]

## 2024-01-24 NOTE — ED Triage Notes (Signed)
 [facility] for acute dyspnea and chest pain. Pt seen here Monday for a clot in her LLE. Pt took 325mg  ASA prior to arrival. Pt ESRD with Tues Thur Sat dialysis. EMS reports initial SpO2 in mid 80s that decreased to 60s. EMS placed pt on NRB and SpO2 increased to 80%. Pt recently started on blood thinner.

## 2024-01-31 ENCOUNTER — Encounter: Payer: MEDICAID | Attending: Physician Assistant | Admitting: Physician Assistant

## 2024-01-31 DIAGNOSIS — Z992 Dependence on renal dialysis: Secondary | ICD-10-CM | POA: Diagnosis not present

## 2024-01-31 DIAGNOSIS — E11621 Type 2 diabetes mellitus with foot ulcer: Secondary | ICD-10-CM | POA: Diagnosis present

## 2024-01-31 DIAGNOSIS — E1142 Type 2 diabetes mellitus with diabetic polyneuropathy: Secondary | ICD-10-CM | POA: Diagnosis not present

## 2024-01-31 DIAGNOSIS — L84 Corns and callosities: Secondary | ICD-10-CM | POA: Insufficient documentation

## 2024-01-31 DIAGNOSIS — Z794 Long term (current) use of insulin: Secondary | ICD-10-CM | POA: Diagnosis not present

## 2024-01-31 DIAGNOSIS — L97528 Non-pressure chronic ulcer of other part of left foot with other specified severity: Secondary | ICD-10-CM | POA: Diagnosis not present

## 2024-01-31 DIAGNOSIS — Z89432 Acquired absence of left foot: Secondary | ICD-10-CM | POA: Insufficient documentation

## 2024-01-31 DIAGNOSIS — E785 Hyperlipidemia, unspecified: Secondary | ICD-10-CM | POA: Diagnosis not present

## 2024-01-31 DIAGNOSIS — N186 End stage renal disease: Secondary | ICD-10-CM | POA: Diagnosis not present

## 2024-01-31 DIAGNOSIS — E1122 Type 2 diabetes mellitus with diabetic chronic kidney disease: Secondary | ICD-10-CM | POA: Insufficient documentation

## 2024-01-31 DIAGNOSIS — I43 Cardiomyopathy in diseases classified elsewhere: Secondary | ICD-10-CM | POA: Insufficient documentation

## 2024-01-31 DIAGNOSIS — I1311 Hypertensive heart and chronic kidney disease without heart failure, with stage 5 chronic kidney disease, or end stage renal disease: Secondary | ICD-10-CM | POA: Diagnosis not present

## 2024-02-05 ENCOUNTER — Encounter: Payer: MEDICAID | Admitting: Physician Assistant

## 2024-02-07 ENCOUNTER — Encounter: Payer: MEDICAID | Admitting: Physician Assistant

## 2024-02-07 DIAGNOSIS — E11621 Type 2 diabetes mellitus with foot ulcer: Secondary | ICD-10-CM | POA: Diagnosis not present

## 2024-02-12 ENCOUNTER — Encounter: Payer: MEDICAID | Admitting: Physician Assistant

## 2024-02-12 DIAGNOSIS — E11621 Type 2 diabetes mellitus with foot ulcer: Secondary | ICD-10-CM | POA: Diagnosis not present

## 2024-02-18 ENCOUNTER — Emergency Department (HOSPITAL_COMMUNITY): Payer: MEDICAID

## 2024-02-18 ENCOUNTER — Other Ambulatory Visit: Payer: Self-pay

## 2024-02-18 ENCOUNTER — Inpatient Hospital Stay (HOSPITAL_COMMUNITY)
Admission: EM | Admit: 2024-02-18 | Discharge: 2024-02-20 | DRG: 073 | Disposition: A | Discharge status: Home / Self Care | Payer: MEDICAID | Attending: Internal Medicine | Admitting: Internal Medicine

## 2024-02-18 ENCOUNTER — Encounter (HOSPITAL_COMMUNITY): Payer: Self-pay

## 2024-02-18 DIAGNOSIS — E1151 Type 2 diabetes mellitus with diabetic peripheral angiopathy without gangrene: Secondary | ICD-10-CM | POA: Diagnosis present

## 2024-02-18 DIAGNOSIS — J449 Chronic obstructive pulmonary disease, unspecified: Secondary | ICD-10-CM | POA: Diagnosis present

## 2024-02-18 DIAGNOSIS — L97524 Non-pressure chronic ulcer of other part of left foot with necrosis of bone: Secondary | ICD-10-CM

## 2024-02-18 DIAGNOSIS — M869 Osteomyelitis, unspecified: Secondary | ICD-10-CM | POA: Diagnosis present

## 2024-02-18 DIAGNOSIS — F17210 Nicotine dependence, cigarettes, uncomplicated: Secondary | ICD-10-CM | POA: Diagnosis present

## 2024-02-18 DIAGNOSIS — E1169 Type 2 diabetes mellitus with other specified complication: Secondary | ICD-10-CM | POA: Diagnosis present

## 2024-02-18 DIAGNOSIS — I5042 Chronic combined systolic (congestive) and diastolic (congestive) heart failure: Secondary | ICD-10-CM | POA: Diagnosis present

## 2024-02-18 DIAGNOSIS — E876 Hypokalemia: Secondary | ICD-10-CM | POA: Diagnosis present

## 2024-02-18 DIAGNOSIS — L97529 Non-pressure chronic ulcer of other part of left foot with unspecified severity: Secondary | ICD-10-CM | POA: Diagnosis present

## 2024-02-18 DIAGNOSIS — Z79899 Other long term (current) drug therapy: Secondary | ICD-10-CM

## 2024-02-18 DIAGNOSIS — E11621 Type 2 diabetes mellitus with foot ulcer: Secondary | ICD-10-CM | POA: Diagnosis present

## 2024-02-18 DIAGNOSIS — N186 End stage renal disease: Secondary | ICD-10-CM

## 2024-02-18 DIAGNOSIS — Z89412 Acquired absence of left great toe: Secondary | ICD-10-CM

## 2024-02-18 DIAGNOSIS — Z813 Family history of other psychoactive substance abuse and dependence: Secondary | ICD-10-CM

## 2024-02-18 DIAGNOSIS — F319 Bipolar disorder, unspecified: Secondary | ICD-10-CM | POA: Diagnosis present

## 2024-02-18 DIAGNOSIS — E1143 Type 2 diabetes mellitus with diabetic autonomic (poly)neuropathy: Principal | ICD-10-CM | POA: Diagnosis present

## 2024-02-18 DIAGNOSIS — G40909 Epilepsy, unspecified, not intractable, without status epilepticus: Secondary | ICD-10-CM | POA: Diagnosis present

## 2024-02-18 DIAGNOSIS — E118 Type 2 diabetes mellitus with unspecified complications: Secondary | ICD-10-CM | POA: Diagnosis not present

## 2024-02-18 DIAGNOSIS — Z7982 Long term (current) use of aspirin: Secondary | ICD-10-CM

## 2024-02-18 DIAGNOSIS — F1910 Other psychoactive substance abuse, uncomplicated: Secondary | ICD-10-CM | POA: Diagnosis present

## 2024-02-18 DIAGNOSIS — Z90710 Acquired absence of both cervix and uterus: Secondary | ICD-10-CM

## 2024-02-18 DIAGNOSIS — Z794 Long term (current) use of insulin: Secondary | ICD-10-CM | POA: Diagnosis not present

## 2024-02-18 DIAGNOSIS — N2581 Secondary hyperparathyroidism of renal origin: Secondary | ICD-10-CM | POA: Diagnosis present

## 2024-02-18 DIAGNOSIS — I132 Hypertensive heart and chronic kidney disease with heart failure and with stage 5 chronic kidney disease, or end stage renal disease: Secondary | ICD-10-CM | POA: Diagnosis present

## 2024-02-18 DIAGNOSIS — Z992 Dependence on renal dialysis: Secondary | ICD-10-CM | POA: Diagnosis not present

## 2024-02-18 DIAGNOSIS — I739 Peripheral vascular disease, unspecified: Secondary | ICD-10-CM | POA: Diagnosis present

## 2024-02-18 DIAGNOSIS — K219 Gastro-esophageal reflux disease without esophagitis: Secondary | ICD-10-CM | POA: Diagnosis present

## 2024-02-18 DIAGNOSIS — Z881 Allergy status to other antibiotic agents status: Secondary | ICD-10-CM | POA: Diagnosis not present

## 2024-02-18 DIAGNOSIS — E785 Hyperlipidemia, unspecified: Secondary | ICD-10-CM | POA: Diagnosis present

## 2024-02-18 DIAGNOSIS — Z86718 Personal history of other venous thrombosis and embolism: Secondary | ICD-10-CM

## 2024-02-18 DIAGNOSIS — M86672 Other chronic osteomyelitis, left ankle and foot: Secondary | ICD-10-CM | POA: Diagnosis present

## 2024-02-18 DIAGNOSIS — K3184 Gastroparesis: Secondary | ICD-10-CM | POA: Diagnosis present

## 2024-02-18 DIAGNOSIS — Z8249 Family history of ischemic heart disease and other diseases of the circulatory system: Secondary | ICD-10-CM

## 2024-02-18 DIAGNOSIS — R112 Nausea with vomiting, unspecified: Secondary | ICD-10-CM | POA: Diagnosis not present

## 2024-02-18 DIAGNOSIS — E1122 Type 2 diabetes mellitus with diabetic chronic kidney disease: Secondary | ICD-10-CM | POA: Diagnosis present

## 2024-02-18 DIAGNOSIS — D631 Anemia in chronic kidney disease: Secondary | ICD-10-CM | POA: Diagnosis present

## 2024-02-18 DIAGNOSIS — Z89421 Acquired absence of other right toe(s): Secondary | ICD-10-CM

## 2024-02-18 DIAGNOSIS — Z7901 Long term (current) use of anticoagulants: Secondary | ICD-10-CM

## 2024-02-18 DIAGNOSIS — Z8614 Personal history of Methicillin resistant Staphylococcus aureus infection: Secondary | ICD-10-CM

## 2024-02-18 DIAGNOSIS — Z89422 Acquired absence of other left toe(s): Secondary | ICD-10-CM

## 2024-02-18 DIAGNOSIS — Z9049 Acquired absence of other specified parts of digestive tract: Secondary | ICD-10-CM

## 2024-02-18 HISTORY — DX: Dependence on renal dialysis: N18.6

## 2024-02-18 HISTORY — DX: Nausea with vomiting, unspecified: R11.2

## 2024-02-18 LAB — COMPREHENSIVE METABOLIC PANEL WITH GFR
ALT: 19 U/L (ref 0–44)
AST: 24 U/L (ref 15–41)
Albumin: 3.6 g/dL (ref 3.5–5.0)
Alkaline Phosphatase: 110 U/L (ref 38–126)
Anion gap: 15 (ref 5–15)
BUN: 11 mg/dL (ref 6–20)
CO2: 27 mmol/L (ref 22–32)
Calcium: 9.1 mg/dL (ref 8.9–10.3)
Chloride: 92 mmol/L — ABNORMAL LOW (ref 98–111)
Creatinine, Ser: 2.61 mg/dL — ABNORMAL HIGH (ref 0.44–1.00)
GFR, Estimated: 22 mL/min — ABNORMAL LOW (ref >60)
Glucose, Bld: 144 mg/dL — ABNORMAL HIGH (ref 70–99)
Potassium: 2.7 mmol/L — CL (ref 3.5–5.1)
Sodium: 134 mmol/L — ABNORMAL LOW (ref 135–145)
Total Bilirubin: 0.6 mg/dL (ref 0.0–1.2)
Total Protein: 7.9 g/dL (ref 6.5–8.1)

## 2024-02-18 LAB — TROPONIN I (HIGH SENSITIVITY)
Troponin I (High Sensitivity): 20 ng/L — ABNORMAL HIGH
Troponin I (High Sensitivity): 20 ng/L — ABNORMAL HIGH (ref <18)

## 2024-02-18 LAB — APTT
aPTT: 29 s (ref 24–36)
aPTT: 42 s — ABNORMAL HIGH (ref 24–36)

## 2024-02-18 LAB — HEMOGLOBIN A1C
Hgb A1c MFr Bld: 5.4 % (ref 4.8–5.6)
Mean Plasma Glucose: 108.28 mg/dL

## 2024-02-18 LAB — CBC WITH DIFFERENTIAL/PLATELET
Abs Immature Granulocytes: 0.01 10*3/uL (ref 0.00–0.07)
Basophils Absolute: 0.1 10*3/uL (ref 0.0–0.1)
Basophils Relative: 1 %
Eosinophils Absolute: 0.2 10*3/uL (ref 0.0–0.5)
Eosinophils Relative: 3 %
HCT: 35.9 % — ABNORMAL LOW (ref 36.0–46.0)
Hemoglobin: 11.8 g/dL — ABNORMAL LOW (ref 12.0–15.0)
Immature Granulocytes: 0 %
Lymphocytes Relative: 14 %
Lymphs Abs: 0.8 10*3/uL (ref 0.7–4.0)
MCH: 31.5 pg (ref 26.0–34.0)
MCHC: 32.9 g/dL (ref 30.0–36.0)
MCV: 95.7 fL (ref 80.0–100.0)
Monocytes Absolute: 0.4 10*3/uL (ref 0.1–1.0)
Monocytes Relative: 7 %
Neutro Abs: 4.4 10*3/uL (ref 1.7–7.7)
Neutrophils Relative %: 75 %
Platelets: 403 10*3/uL — ABNORMAL HIGH (ref 150–400)
RBC: 3.75 MIL/uL — ABNORMAL LOW (ref 3.87–5.11)
RDW: 13.5 % (ref 11.5–15.5)
WBC: 5.9 10*3/uL (ref 4.0–10.5)
nRBC: 0 % (ref 0.0–0.2)

## 2024-02-18 LAB — HIV ANTIBODY (ROUTINE TESTING W REFLEX): HIV Screen 4th Generation wRfx: NONREACTIVE

## 2024-02-18 LAB — C-REACTIVE PROTEIN: CRP: <0.5 mg/dL (ref <1.0)

## 2024-02-18 LAB — HEPARIN LEVEL (UNFRACTIONATED)
Heparin Unfractionated: 0.2 [IU]/mL — ABNORMAL LOW (ref 0.30–0.70)
Heparin Unfractionated: <0.1 [IU]/mL — ABNORMAL LOW (ref 0.30–0.70)

## 2024-02-18 LAB — LIPASE, BLOOD: Lipase: 100 U/L — ABNORMAL HIGH (ref 11–51)

## 2024-02-18 LAB — SEDIMENTATION RATE: Sed Rate: 45 mm/h — ABNORMAL HIGH (ref 0–22)

## 2024-02-18 LAB — MAGNESIUM: Magnesium: 1.8 mg/dL (ref 1.7–2.4)

## 2024-02-18 MED ORDER — POTASSIUM CHLORIDE 10 MEQ/100ML IV SOLN
10.0000 meq | INTRAVENOUS | Status: AC
  Administered 2024-02-18 (×2): 10 meq via INTRAVENOUS
  Filled 2024-02-18 (×2): qty 100

## 2024-02-18 MED ORDER — POTASSIUM CHLORIDE 20 MEQ/100ML IV SOLN
20.0000 meq | Freq: Once | INTRAVENOUS | Status: DC
  Filled 2024-02-18: qty 100

## 2024-02-18 MED ORDER — ACETAMINOPHEN 325 MG PO TABS: 650.0000 mg | ORAL_TABLET | Freq: Four times a day (QID) | ORAL | Status: DC | PRN

## 2024-02-18 MED ORDER — METOCLOPRAMIDE HCL 5 MG/ML IJ SOLN
10.0000 mg | Freq: Three times a day (TID) | INTRAMUSCULAR | Status: DC
  Administered 2024-02-18 – 2024-02-20 (×6): 10 mg via INTRAVENOUS
  Filled 2024-02-18 (×6): qty 2

## 2024-02-18 MED ORDER — ONDANSETRON HCL 4 MG/2ML IJ SOLN
4.0000 mg | Freq: Once | INTRAMUSCULAR | Status: AC
  Administered 2024-02-18: 4 mg via INTRAVENOUS
  Filled 2024-02-18: qty 2

## 2024-02-18 MED ORDER — SODIUM CHLORIDE 0.9 % IV SOLN
12.5000 mg | Freq: Once | INTRAVENOUS | Status: AC
  Administered 2024-02-18: 12.5 mg via INTRAVENOUS
  Filled 2024-02-18: qty 12.5

## 2024-02-18 MED ORDER — LACTATED RINGERS IV BOLUS
500.0000 mL | Freq: Once | INTRAVENOUS | Status: AC
  Administered 2024-02-18: 500 mL via INTRAVENOUS

## 2024-02-18 MED ORDER — VANCOMYCIN HCL 500 MG/100ML IV SOLN
500.0000 mg | INTRAVENOUS | Status: DC
  Administered 2024-02-20: 500 mg via INTRAVENOUS
  Filled 2024-02-18: qty 100

## 2024-02-18 MED ORDER — PANTOPRAZOLE SODIUM 40 MG IV SOLR
40.0000 mg | Freq: Two times a day (BID) | INTRAVENOUS | Status: DC
  Administered 2024-02-18 – 2024-02-20 (×4): 40 mg via INTRAVENOUS
  Filled 2024-02-18 (×4): qty 10

## 2024-02-18 MED ORDER — ACETAMINOPHEN 650 MG RE SUPP: 650.0000 mg | Freq: Four times a day (QID) | RECTAL | Status: DC | PRN

## 2024-02-18 MED ORDER — SODIUM CHLORIDE 0.9% FLUSH
3.0000 mL | Freq: Two times a day (BID) | INTRAVENOUS | Status: DC
  Administered 2024-02-18 – 2024-02-20 (×4): 3 mL via INTRAVENOUS

## 2024-02-18 MED ORDER — HEPARIN (PORCINE) 25000 UT/250ML-% IV SOLN
1200.0000 [IU]/h | INTRAVENOUS | Status: DC
  Administered 2024-02-18: 1050 [IU]/h via INTRAVENOUS
  Administered 2024-02-19: 1200 [IU]/h via INTRAVENOUS
  Filled 2024-02-18 (×2): qty 250

## 2024-02-18 MED ORDER — PIPERACILLIN-TAZOBACTAM IN DEX 2-0.25 GM/50ML IV SOLN
2.2500 g | Freq: Three times a day (TID) | INTRAVENOUS | Status: DC
  Administered 2024-02-18 – 2024-02-20 (×6): 2.25 g via INTRAVENOUS
  Filled 2024-02-18 (×7): qty 50

## 2024-02-18 MED ORDER — ALBUTEROL SULFATE (2.5 MG/3ML) 0.083% IN NEBU: 2.5000 mg | INHALATION_SOLUTION | RESPIRATORY_TRACT | Status: DC | PRN

## 2024-02-18 MED ORDER — DIPHENHYDRAMINE HCL 50 MG/ML IJ SOLN
12.5000 mg | Freq: Once | INTRAMUSCULAR | Status: AC
  Administered 2024-02-18: 12.5 mg via INTRAVENOUS
  Filled 2024-02-18: qty 1

## 2024-02-18 MED ORDER — METOCLOPRAMIDE HCL 5 MG/ML IJ SOLN
10.0000 mg | Freq: Once | INTRAMUSCULAR | Status: AC
  Administered 2024-02-18: 10 mg via INTRAVENOUS
  Filled 2024-02-18: qty 2

## 2024-02-18 MED ORDER — POTASSIUM CHLORIDE CRYS ER 20 MEQ PO TBCR
40.0000 meq | EXTENDED_RELEASE_TABLET | Freq: Once | ORAL | Status: DC
  Filled 2024-02-18: qty 2

## 2024-02-18 MED ORDER — PROMETHAZINE (PHENERGAN) 6.25MG IN NS 50ML IVPB
6.2500 mg | Freq: Once | INTRAVENOUS | Status: AC
  Administered 2024-02-18: 6.25 mg via INTRAVENOUS
  Filled 2024-02-18: qty 6.25

## 2024-02-18 MED ORDER — SODIUM CHLORIDE 0.9 % IV SOLN: 12.5000 mg | Freq: Four times a day (QID) | INTRAVENOUS | Status: DC | PRN

## 2024-02-18 MED ORDER — VANCOMYCIN HCL 1250 MG/250ML IV SOLN
1250.0000 mg | Freq: Once | INTRAVENOUS | Status: AC
  Administered 2024-02-18: 1250 mg via INTRAVENOUS
  Filled 2024-02-18: qty 250

## 2024-02-18 NOTE — ED Provider Notes (Signed)
 [place] EMERGENCY DEPARTMENT AT [facility] Provider Note   CSN: [id number] Arrival date & time: 02/18/24  4098     History  No chief complaint on file.   Saraphina A Bagnato is a 48 y.o. female.  HPI Patient presents for nausea and vomiting.  Medical history includes CHF, polysubstance abuse, COPD, DM, GERD, HTN, seizures, bipolar disorder, ESRD.  She undergoes dialysis on Tuesday, Thursday, Saturday.  Last dialysis session was yesterday.  For the past 5 days, she has had nausea, vomiting, diarrhea, and p.o. intolerance.  She has not been able to keep anything down, including her home medications.  She has not been able to use her home Phenergan .  She endorses pain throughout her chest, abdomen, and back.  She also has a nonhealing ulcer on the plantar aspect of her left foot.  She states that this has been nonhealing for the past several years.  She has had worsened pain in this area.  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  She describes her stool and vomitus as black.    Home Medications Prior to Admission medications   Medication Sig Start Date End Date Taking? Authorizing Provider  acetaminophen  (TYLENOL ) 325 MG tablet Take 2 tablets (650 mg total) by mouth every 6 (six) hours as needed for mild pain (or Fever >/= 101). 04/02/20   Dhungel, Delman Ferns, MD  amitriptyline  (ELAVIL ) 150 MG tablet Take 0.5 tablets (75 mg total) by mouth at bedtime. 01/12/21   Ghimire, Kuber, MD  APIXABAN  (ELIQUIS ) VTE STARTER PACK (10MG  AND 5MG ) Take as directed on package: start with two-5mg  tablets twice daily for 7 days. On day 8, switch to one-5mg  tablet twice daily. 01/22/24   Kandee Orion, MD  aspirin  325 MG tablet Take 1 tablet (325 mg total) by mouth daily. 01/12/21   Vada Garibaldi, MD  aspirin  EC 81 MG EC tablet Take 1 tablet (81 mg total) by mouth daily. Swallow whole. 10/08/20   Lesa Rape, MD  atorvastatin  (LIPITOR) 40 MG tablet Take 40 mg by mouth  daily. 12/20/20   [provider]  bethanechol  (URECHOLINE ) 25 MG tablet Take 25 mg by mouth 2 (two) times daily.     [provider]  gabapentin  (NEURONTIN ) 100 MG capsule Take 1 capsule (100 mg total) by mouth 3 (three) times daily. 01/12/21   Ghimire, Kuber, MD  insulin  aspart (NOVOLOG ) 100 UNIT/ML injection Inject into the skin 4 (four) times daily -  before meals and at bedtime. Per Sliding Scale    [provider]  insulin  glargine (LANTUS ) 100 UNIT/ML injection Inject 0.1 mLs (10 Units total) into the skin 2 (two) times daily. 01/12/21   Vada Garibaldi, MD  magnesium  oxide (MAG-OX) 400 (241.3 Mg) MG tablet Take 1 tablet (400 mg total) by mouth daily. 01/12/21   Ghimire, Kuber, MD  metoprolol  tartrate (LOPRESSOR ) 25 MG tablet Take 12.5 mg by mouth 2 (two) times daily.    [provider]  oxyCODONE -acetaminophen  (PERCOCET) 5-325 MG tablet Take 1 tablet by mouth every 6 (six) hours as needed for severe pain (pain score 7-10) or moderate pain (pain score 4-6). 01/24/24   Bradler, Evan K, MD  pantoprazole  (PROTONIX ) 20 MG tablet Take 20 mg by mouth 2 (two) times daily. 08/21/18   [provider]  promethazine  (PHENERGAN ) 25 MG tablet Take 25 mg by mouth every 6 (six) hours as needed for nausea or vomiting.    [provider]  QUEtiapine  (SEROQUEL ) 200 MG tablet Take 1 tablet (200 mg total) by mouth at bedtime. 01/12/21   Ghimire, Kuber, MD  senna-docusate (SENOKOT-S) 8.6-50 MG tablet Take 1 tablet by mouth at bedtime as needed for mild constipation. 04/02/20   Dhungel, Nishant, MD  spironolactone (ALDACTONE) 25 MG tablet Take 25 mg by mouth daily. 12/20/20   [provider]  tamsulosin  (FLOMAX ) 0.4 MG CAPS capsule Take 0.4 mg by mouth at bedtime.     [provider]  venlafaxine  XR (EFFEXOR -XR) 75 MG 24 hr capsule Take 1 capsule (75 mg total) by mouth at bedtime. 01/12/21   Ghimire, Kuber, MD      Allergies    Albumin (human),  Erythromycin, and Midecamycin    Review of Systems   Review of Systems  Constitutional:  Positive for activity change, appetite change and fatigue.  Cardiovascular:  Positive for chest pain.  Gastrointestinal:  Positive for abdominal pain, diarrhea, nausea and vomiting.  Musculoskeletal:  Positive for back pain.  All other systems reviewed and are negative.   Physical Exam Updated Vital Signs BP 101/62   Pulse 60   Temp 98.3 F (36.8 C) (Oral)   Resp 18   LMP  (LMP Unknown)   SpO2 98%  Physical Exam Vitals and nursing note reviewed.  Constitutional:      General: She is not in acute distress.    Appearance: She is well-developed. She is ill-appearing. She is not toxic-appearing or diaphoretic.  HENT:     Head: Normocephalic and atraumatic.     Right Ear: External ear normal.     Left Ear: External ear normal.     Nose: Nose normal.     Mouth/Throat:     Mouth: Mucous membranes are moist.  Eyes:     Extraocular Movements: Extraocular movements intact.     Conjunctiva/sclera: Conjunctivae normal.  Cardiovascular:     Rate and Rhythm: Normal rate and regular rhythm.     Heart sounds: No murmur heard. Pulmonary:     Effort: Pulmonary effort is normal. No respiratory distress.     Breath sounds: Normal breath sounds. No wheezing or rales.  Chest:     Chest wall: No tenderness.  Abdominal:     General: There is no distension.     Palpations: Abdomen is soft.     Tenderness: There is abdominal tenderness. There is no guarding or rebound.  Musculoskeletal:        General: No swelling. Normal range of motion.     Cervical back: Normal range of motion and neck supple.  Skin:    General: Skin is warm and dry.     Coloration: Skin is not jaundiced or pale.  Neurological:     General: No focal deficit present.     Mental Status: She is alert and oriented to person, place, and time.  Psychiatric:        Mood and Affect: Mood normal.        Behavior: Behavior normal.      ED Results / Procedures / Treatments   Labs (all labs ordered are listed, but only abnormal results are displayed) Labs Reviewed  COMPREHENSIVE METABOLIC PANEL WITH GFR - Abnormal; Notable for the following components:      Result Value   Sodium 134 (*)    Potassium 2.7 (*)    Chloride 92 (*)    Glucose, Bld 144 (*)    Creatinine, Ser 2.61 (*)    GFR, Estimated 22 (*)  All other components within normal limits  LIPASE, BLOOD - Abnormal; Notable for the following components:   Lipase 100 (*)    All other components within normal limits  CBC WITH DIFFERENTIAL/PLATELET - Abnormal; Notable for the following components:   RBC 3.75 (*)    Hemoglobin 11.8 (*)    HCT 35.9 (*)    Platelets 403 (*)    All other components within normal limits  TROPONIN I (HIGH SENSITIVITY) - Abnormal; Notable for the following components:   Troponin I (High Sensitivity) 20 (*)    All other components within normal limits  TROPONIN I (HIGH SENSITIVITY) - Abnormal; Notable for the following components:   Troponin I (High Sensitivity) 20 (*)    All other components within normal limits  CULTURE, BLOOD (ROUTINE X 2)  CULTURE, BLOOD (ROUTINE X 2)  MAGNESIUM   URINALYSIS, ROUTINE W REFLEX MICROSCOPIC  RAPID URINE DRUG SCREEN, HOSP PERFORMED  POC OCCULT BLOOD, ED    EKG EKG Interpretation Date/Time:  Sunday February 18 2024 08:36:06 EDT Ventricular Rate:  68 PR Interval:  140 QRS Duration:  105 QT Interval:  453 QTC Calculation: 479 R Axis:   92  Text Interpretation: Sinus rhythm Ventricular premature complex LAE, consider biatrial enlargement Borderline right axis deviation Anteroseptal infarct, old Baseline wander in lead(s) II aVF Confirmed by Iva Mariner [phone]) on 02/18/2024 8:43:24 AM  Radiology CT CHEST ABDOMEN PELVIS WO CONTRAST Result Date: 02/18/2024 CLINICAL DATA:  Unintentional weight loss EXAM: CT CHEST, ABDOMEN AND PELVIS WITHOUT CONTRAST TECHNIQUE: Multidetector CT imaging of the  chest, abdomen and pelvis was performed following the standard protocol without IV contrast. RADIATION DOSE REDUCTION: This exam was performed according to the departmental dose-optimization program which includes automated exposure control, adjustment of the mA and/or kV according to patient size and/or use of iterative reconstruction technique. COMPARISON:  CT a chest 01/24/2024. Chest abdomen and pelvic CTs of 03/16/2023 from [facility] FINDINGS: CT CHEST FINDINGS Cardiovascular: Aortic atherosclerosis. 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 Normal heart size, without pericardial effusion. Lad and left circumflex coronary artery calcification. Mediastinum/Nodes: No mediastinal or hilar adenopathy, given limitations of unenhanced CT. Lungs/Pleura: No pleural fluid.  Mild bibasilar scarring. Musculoskeletal: Included within the abdomen pelvic section. CT ABDOMEN PELVIS FINDINGS Hepatobiliary: Normal liver. Cholecystectomy, without biliary ductal dilatation. Pancreas: Normal, without mass or ductal dilatation. Spleen: Normal in size, without focal abnormality. Adrenals/Urinary Tract: Normal adrenal glands. Mild renal cortical thinning bilaterally. Punctate interpolar left renal collecting system calculi. No hydroureter or ureteric calculi. Low-density bilateral renal lesions of maximally 1.4 cm are likely cysts and do not warrant follow-up imaging. No bladder calculi. Stomach/Bowel: Normal stomach, without wall thickening. Normal colon and terminal ileum. Appendix not visualized. Normal small bowel. Vascular/Lymphatic: Aortic atherosclerosis. No abdominopelvic adenopathy. Reproductive: Normal uterus and adnexa. Other: No significant free fluid.  No free intraperitoneal air. Musculoskeletal: Mild T12 superior endplate compression deformity is unchanged. No ventral canal encroachment. IMPRESSION: 1. Low sensitivity exam secondary to lack of oral or IV contrast. 2. Given this  limitation, no explanation for weight loss. 3. Age advanced coronary artery atherosclerosis. Recommend assessment of coronary risk factors. 4.  Aortic Atherosclerosis (ICD10-I70.0). 5. Nonobstructive left nephrolithiasis. Electronically Signed   By: Lore Rode M.D.   On: 02/18/2024 14:48   DG Foot 2 Views Left Result Date: 02/18/2024 CLINICAL DATA:  Nonhealing ulcer.  Pain. EXAM: LEFT FOOT - 2 VIEW COMPARISON:  03/23/2024. FINDINGS: Plate and screw fixation of the distal fibula with 2 cannulated  screws fixating the medial malleolus as before. 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 Soft tissue ulceration is identified along the medial plantar surface of the foot underlying the navicular bone and medial cuneiform. There is progressive bony erosion along the posteromedial aspect of the medial cuneiform and anterolateral aspect of the navicular bone. No signs of acute fracture. IMPRESSION: 1. Soft tissue ulceration along the medial plantar surface of the foot adjacent to the navicular bone and medial cuneiform. 2. Progressive bony erosion along the posteromedial aspect of the medial cuneiform and anterolateral aspect of the navicular bone compatible with osteomyelitis. 3. 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 Electronically Signed   By: Kimberley Penman M.D.   On: 02/18/2024 09:15    Procedures Procedures    Medications Ordered in ED Medications  potassium chloride  SA (KLOR-CON  M) CR tablet 40 mEq (40 mEq Oral Not Given 02/18/24 1322)  piperacillin -tazobactam (ZOSYN ) IVPB 2.25 g (0 g Intravenous Stopped 02/18/24 1503)  vancomycin  (VANCOREADY) IVPB 500 mg/100 mL (has no administration in time range)  metoCLOPramide  (REGLAN ) injection 10 mg (10 mg Intravenous Given 02/18/24 0908)  lactated ringers  bolus 500 mL (0 mLs Intravenous Stopped 02/18/24 1028)  diphenhydrAMINE  (BENADRYL ) injection 12.5 mg (12.5 mg Intravenous Given 02/18/24 0908)  vancomycin  (VANCOREADY)  IVPB 1250 mg/250 mL (0 mg Intravenous Stopped 02/18/24 1502)  promethazine  (PHENERGAN ) 6.25 mg/NS 50 mL IVPB (0 mg Intravenous Stopped 02/18/24 1147)  ondansetron  (ZOFRAN ) injection 4 mg (4 mg Intravenous Given 02/18/24 1434)    ED Course/ Medical Decision Making/ A&P                                 Medical Decision Making Amount and/or Complexity of Data Reviewed Labs: ordered. Radiology: ordered.  Risk Prescription drug management.   This patient presents to the ED for concern of nausea and vomiting, this involves an extensive number of treatment options, and is a complaint that carries with it a high risk of complications and morbidity.  The differential diagnosis includes cyclic vomiting syndrome, enteritis, GERD, infection, metabolic derangements, bowel obstruction   Co morbidities that complicate the patient evaluation  CHF, polysubstance abuse, COPD, DM, GERD, HTN, seizures, bipolar disorder, ESRD   Additional history obtained:  Additional history obtained from N/A External records from outside source obtained and reviewed including EMR   Lab Tests:  I Ordered, and personally interpreted labs.  The pertinent results include: Baseline creatinine, hypokalemia, anemia improved from baseline, no leukocytosis, mild elevation in lipase but not consistent with pancreatitis   Imaging Studies ordered:  I ordered imaging studies including left foot x-ray; CT of the chest, abdomen, pelvis; MRI of left foot; right upper quadrant ultrasound I independently visualized and interpreted imaging which showed foot x-ray showed concern of osteomyelitis; CT scan showed no acute findings.  Remaining imaging pending at time of admission. I agree with the radiologist interpretation   Cardiac Monitoring: / EKG:  The patient was maintained on a cardiac monitor.  I personally viewed and interpreted the cardiac monitored which showed an underlying rhythm of: Sinus rhythm   Problem List / ED  Course / Critical interventions / Medication management  Patient presenting for 5 days of nausea, vomiting, p.o. intolerance.  Additionally, she has had worsened pain in area of nonhealing left foot ulcer as well as diffuse pain throughout chest, abdomen, back.  On arrival in the ED, she is retching and dry heaving.  Reglan  and Benadryl   were ordered.  IV fluids were ordered for hydration given her fluid losses.  Patient states that she is 1 year clean and sober and requesting no narcotic pain medication.  Workup was initiated.  Foot x-ray showed concern of osteomyelitis.  I spoke with orthopedic surgeon on-call, Dr. Deeann Fare, who states that someone will see her in consult either today or tomorrow.  IV antibiotics were initiated.  Patient had recurrence of nausea and vomiting while in the ED.  Phenergan  was ordered.  Lab work was notable for hypokalemia.  Replacement potassium was ordered.  Patient to be admitted for further management. I ordered medication including IV fluids for hydration; broad-spectrum antibiotics for osteomyelitis; Reglan , Benadryl , Phenergan , Zofran  for nausea Reevaluation of the patient after these medicines showed that the patient improved I have reviewed the patients home medicines and have made adjustments as needed   Consultations Obtained:  I requested consultation with the orthopedic surgeon, Dr. Deeann Fare,  and discussed lab and imaging findings as well as pertinent plan - they recommend: Ortho will see in consult   Social Determinants of Health:  Lives independently         Final Clinical Impression(s) / ED Diagnoses Final diagnoses:  Nausea and vomiting, unspecified vomiting type  Osteomyelitis of left foot, unspecified type (HCC)  Hypokalemia    Rx / DC Orders ED Discharge Orders     None         Iva Mariner, MD 02/18/24 1510

## 2024-02-18 NOTE — ED Notes (Signed)
 Phlebotomy contacted about [facility] and repeat trop draw.

## 2024-02-18 NOTE — H&P (Signed)
 History and Physical    Patient: [name] DOB: May 23, 1976 DOA: 02/18/2024 DOS: the patient was seen and examined on 02/18/2024 PCP: Darylene Epley, MD  Patient coming from: Home  Chief Complaint: Intractable nausea and vomiting.  HPI: [name] is a 48 y.o. female with medical history significant of heart failure with preserved EF, type 2 diabetes on insulin , hypertension, COPD, ESRD, seizure disorder, bipolar disorder, history of substance abuse, neuropathy, DVT of the left popliteal vein on anticoagulation who presents with nausea and vomiting.  She has been experiencing persistent vomiting for the last 5 days, despite previous medical interventions. The vomitus is yellow in color, and at one point, it appeared as a 'chunk.' Her mother reports that she seemed better on Friday, but the symptoms returned Friday night and persisted through Saturday and into today, with continuous vomiting and lack of sleep.  Denies any use of drugs or alcohol in the last year.  She describes the abdominal pain as diffuse, stating 'I'm hurting everywhere.' No fever or chills accompany her symptoms.  She is currently undergoing dialysis on a Tuesday, Thursday, and Saturday schedule, with her last session being yesterday. She has not experienced similar episodes of vomiting in the past.  Records note a previous hospitalization at [facility] in 02/2023 for nausea and vomiting with concern for hematemesis and anemia for which patient underwent a EGD that was normal without signs of bleeding.  She had just recently been diagnosed with a DVT of the left leg in March and started on Eliquis , but last took the medication 5 days ago due to her symptoms.  Patient also has a nonhealing ulcer on the plantar aspect of her left foot.  She reports having 3 prior surgeries on her left foot..  She has been being followed by wound care in the outpatient setting.  Reports going to [facility] last  week where CT was done,  she was set up to surgeon on 5/5 to discuss having a below knee amputation, and was sent home on oral antibiotics of Augmentin  and doxycycline .  However, she had been unable to keep the medications down due to all of her nausea and vomiting.  She denies any drug or alcohol use in the last year.  In the emergency department patient was noted to be afebrile with stable vital signs.  Labs significant for hemoglobin 11.8, platelets 403, sodium 134, potassium 3.7, chloride 92, creatinine 2.61, lipase 100, high-sensitivity troponin 20->20.  X-ray imaging of the left foot revealed soft tissue ulceration along the medial plantar surface of the foot with progressive bony erosion on the posterior medial aspect of the medial cuneiform and anterolateral aspect of the navicular bone compatible with osteomyelitis.  CT scan of the chest abdomen pelvis was done and noted no acute abnormality, but was limited due to lack of oral/IV contrast.  Blood cultures were obtained. MRI of the left foot was pending.  Orthopedics had been consulted and recommended possible need of BKA.  Patient was given 500 mL of lactated ringer, Benadryl , Zofran , Reglan , potassium, potassium chloride  40 meq p.o., Zosyn , and vancomycin .  Review of Systems: As mentioned in the history of present illness. All other systems reviewed and are negative. Past Medical History:  Diagnosis Date   Bipolar disorder (HCC)    Chronic combined systolic and diastolic CHF (congestive heart failure) (HCC)    a. ECHO 04/23/14 LV EF: 45% - 50%,  mild LVH, diffuse hypokinesis. LV diastolic dysfunction, high ventricular filling  pressure. Mild LA dilation.   Cocaine abuse (HCC)    COPD (chronic obstructive pulmonary disease) (HCC)    Diabetes mellitus without complication (HCC)    Dyspnea    GERD (gastroesophageal reflux disease)    Heroin overdose (HCC) 10/2018   Hypertension    MRSA infection    arm wound   Nicotine  dependence with  current use    Seizures (HCC)    last seizure March 2021; usual 1-2 per month   Past Surgical History:  Procedure Laterality Date   ABDOMINAL HYSTERECTOMY     ACHILLES TENDON SURGERY Left 03/31/2020   Procedure: ACHILLES LENGTHENING;  Surgeon: Pink Bridges, DPM;  Location: [facility] ORS;  Service: Podiatry;  Laterality: Left;   AMPUTATION Right 10/04/2020   Procedure: PARTIAL AMPUTATION RAY 4th and 5th;  Surgeon: Pink Bridges, DPM;  Location: [facility] ORS;  Service: Podiatry;  Laterality: Right;   AMPUTATION TOE Left 03/23/2019   Procedure: AMPUTATION TOE GREAT LEFT TOE;  Surgeon: Sharlyn Deaner, DPM;  Location: [facility] ORS;  Service: Podiatry;  Laterality: Left;   AMPUTATION TOE Left 08/16/2019   Procedure: AMPUTATION TOE 5th;  Surgeon: Pink Bridges, DPM;  Location: [facility] ORS;  Service: Podiatry;  Laterality: Left;   AMPUTATION TOE Left 02/05/2020   Procedure: AMPUTATION MPJ LEFT 4TH;  Surgeon: Pink Bridges, DPM;  Location: [facility] ORS;  Service: Podiatry;  Laterality: Left;   ANKLE FRACTURE SURGERY Left    pins/screws in place   BUBBLE STUDY  01/08/2021   Procedure: BUBBLE STUDY;  Surgeon: Lenise Quince, MD;  Location: [facility] ENDOSCOPY;  Service: Cardiovascular;;   CHOLECYSTECTOMY  2005   FRACTURE SURGERY     IRRIGATION AND DEBRIDEMENT FOOT Left 10/04/2020   Procedure: IRRIGATION AND DEBRIDEMENT FOOT;  Surgeon: Pink Bridges, DPM;  Location: [facility] ORS;  Service: Podiatry;  Laterality: Left;   LOWER EXTREMITY ANGIOGRAPHY Left 03/12/2019   Procedure: LOWER EXTREMITY ANGIOGRAPHY;  Surgeon: Jackquelyn Mass, MD;  Location: [facility] INVASIVE CV LAB;  Service: Cardiovascular;  Laterality: Left;   LOWER EXTREMITY ANGIOGRAPHY Right 10/06/2020   Procedure: Lower Extremity Angiography;  Surgeon: Jackquelyn Mass, MD;  Location: [facility] INVASIVE CV LAB;  Service: Cardiovascular;  Laterality: Right;   TEE WITHOUT CARDIOVERSION N/A 03/26/2019   Procedure: TRANSESOPHAGEAL ECHOCARDIOGRAM (TEE);  Surgeon: Sammy Crisp, MD;  Location: [facility] ORS;  Service: Cardiovascular;  Laterality: N/A;   TEE WITHOUT CARDIOVERSION N/A 01/08/2021   Procedure: TRANSESOPHAGEAL ECHOCARDIOGRAM (TEE);  Surgeon: Lenise Quince, MD;  Location: [facility] ENDOSCOPY;  Service: Cardiovascular;  Laterality: N/A;   TRANSMETATARSAL AMPUTATION Left 03/31/2020   Procedure: TRANSMETATARSAL AMPUTATION;  Surgeon: Pink Bridges, DPM;  Location: [facility] ORS;  Service: Podiatry;  Laterality: Left;   TUBAL LIGATION     Social History:  reports that she has been smoking cigarettes. She has a 28 pack-year smoking history. She has never used smokeless tobacco. She reports that she does not currently use alcohol. She reports that she does not currently use drugs.  Allergies  Allergen Reactions   Albumin (Human)     unknown   Erythromycin Rash   Midecamycin Rash    Family History  Problem Relation Age of Onset   Hypertension Other    Drug abuse Mother    Seizures Neg Hx     Prior to Admission medications   Medication Sig Start Date End Date Taking? Authorizing Provider  acetaminophen  (TYLENOL ) 325 MG tablet Take 2 tablets (650 mg total) by mouth every 6 (six) hours as needed for mild pain (or  Fever >/= 101). 04/02/20   Dhungel, Nishant, MD  amitriptyline  (ELAVIL ) 150 MG tablet Take 0.5 tablets (75 mg total) by mouth at bedtime. 01/12/21   Vada Garibaldi, MD  APIXABAN  (ELIQUIS ) VTE STARTER PACK (10MG  AND 5MG ) Take as directed on package: start with two-5mg  tablets twice daily for 7 days. On day 8, switch to one-5mg  tablet twice daily. 01/22/24   Kandee Orion, MD  aspirin  325 MG tablet Take 1 tablet (325 mg total) by mouth daily. 01/12/21   Vada Garibaldi, MD  aspirin  EC 81 MG EC tablet Take 1 tablet (81 mg total) by mouth daily. Swallow whole. 10/08/20   Lesa Rape, MD  atorvastatin  (LIPITOR) 40 MG tablet Take 40 mg by mouth daily. 12/20/20   [provider]  bethanechol  (URECHOLINE ) 25 MG tablet Take 25 mg by mouth 2 (two) times daily.      [provider]  gabapentin  (NEURONTIN ) 100 MG capsule Take 1 capsule (100 mg total) by mouth 3 (three) times daily. 01/12/21   Ghimire, Kuber, MD  insulin  aspart (NOVOLOG ) 100 UNIT/ML injection Inject into the skin 4 (four) times daily -  before meals and at bedtime. Per Sliding Scale    [provider]  insulin  glargine (LANTUS ) 100 UNIT/ML injection Inject 0.1 mLs (10 Units total) into the skin 2 (two) times daily. 01/12/21   Ghimire, Kuber, MD  magnesium  oxide (MAG-OX) 400 (241.3 Mg) MG tablet Take 1 tablet (400 mg total) by mouth daily. 01/12/21   Ghimire, Kuber, MD  metoprolol  tartrate (LOPRESSOR ) 25 MG tablet Take 12.5 mg by mouth 2 (two) times daily.    [provider]  oxyCODONE -acetaminophen  (PERCOCET) 5-325 MG tablet Take 1 tablet by mouth every 6 (six) hours as needed for severe pain (pain score 7-10) or moderate pain (pain score 4-6). 01/24/24   Bradler, Evan K, MD  pantoprazole  (PROTONIX ) 20 MG tablet Take 20 mg by mouth 2 (two) times daily. 08/21/18   [provider]  promethazine  (PHENERGAN ) 25 MG tablet Take 25 mg by mouth every 6 (six) hours as needed for nausea or vomiting.    [provider]  QUEtiapine  (SEROQUEL ) 200 MG tablet Take 1 tablet (200 mg total) by mouth at bedtime. 01/12/21   Ghimire, Kuber, MD  senna-docusate (SENOKOT-S) 8.6-50 MG tablet Take 1 tablet by mouth at bedtime as needed for mild constipation. 04/02/20   Dhungel, Nishant, MD  spironolactone (ALDACTONE) 25 MG tablet Take 25 mg by mouth daily. 12/20/20   [provider]  tamsulosin  (FLOMAX ) 0.4 MG CAPS capsule Take 0.4 mg by mouth at bedtime.     [provider]  venlafaxine  XR (EFFEXOR -XR) 75 MG 24 hr capsule Take 1 capsule (75 mg total) by mouth at bedtime. 01/12/21   Vada Garibaldi, MD    Physical Exam: Vitals:   02/18/24 0827 02/18/24 1015 02/18/24 1330  BP: 123/62 116/63 101/62  Pulse: 75 71 60  Resp: 16 15 18   Temp: 98.3 F (36.8 C)  98.3  F (36.8 C)  TempSrc: Oral  Oral  SpO2: 100% 99% 98%   Constitutional: Middle-age female who appears to be in distress actively vomiting into emesis bag. Eyes: PERRL, lids and conjunctivae normal ENMT: Mucous membranes are moist.  Normal dentition.  Neck: normal, supple  Respiratory: clear to auscultation bilaterally, no wheezing, no crackles. Normal respiratory effort. No accessory muscle use.  Cardiovascular: Regular rate and rhythm, no murmurs / rubs / gallops. No extremity edema.   Abdomen: Tenderness palpation of the  abdomen appreciated.  Bowel sounds are present. Musculoskeletal: no clubbing / cyanosis.  Transmetatarsal amputation of the left foot.  Amputated 4th and 5th toes of the right foot Skin: 1 inch hole in diameter noted on the medial plantar aspect of the left foot without surrounding erythema or significant drainage appreciated. Neurologic: CN 2-12 grossly intact.  Strength 5/5 in all 4.  Psychiatric: Normal judgment and insight. Alert and oriented x 3. Normal mood.   Data Reviewed:  Reviewed labs, imaging, and pertinent records as documented  Assessment and Plan:  Intractable nausea and vomiting Patient reports having persistent nausea and vomiting over the last 5 days.  Emesis is nonbloody.  Phenergan  seemed to help symptoms.  CT scan of the chest abdomen pelvis did not note any acute abnormality.  Question if symptoms secondary to gastroparesis .  Patient had a EGD back in 02/2023 at [facility] which was normal after presenting with complaints of nausea and vomiting and question of hematemesis. 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  MRI of the foot pending and orthopedics was consulted. - Admit to a telemetry bed - Follow-up blood cultures - Add-on ESR and CRP - Follow-up MRI of the foot - Continue empiric antibiotics of vancomycin  and Zosyn  - Appreciate orthopedic consultative services we will follow-up for any further recommendations  Hypokalemia  Initial potassium noted to be 2.7. Thought secondary to persistent nausea and vomiting.  Patient had been given potassium chloride  40 meq p.o. but likely did not keep this down. - Potassium chloride  20 meq IV - Continue to monitor and carefully replacing  History of DVT Present prior to arrival.  Patient had recently been found to have a DVT of the left leg on 12/2023 and was started on Eliquis .  Last dose of Eliquis  was approximately 5 days ago. - Heparin  per pharmacy  Diabetes mellitus type 2, without long-term use of insulin  On admission glucose was noted to be 144.  Last available hemoglobin A1c was 5.7 on 03/21/2023.  Currently no longer on insulin . -Add on hemoglobin A1c  ESRD on HD Patient is normally a Tuesday, Thursday, Saturday dialysis patient.  She last dialyzed on Saturday. - Dr. Zelda Hickman of nephrology made aware of the patient coming into the hospital on likely need of dialysis during stay  Peripheral vascular disease Hyperlipidemia -Consider resuming atorvastatin  once able  Bipolar disorder Patient reportedly had been prescribed Lexapro but has not started the medication yet.  Polysubstance abuse Patient reports that she has not used drugs or alcohol in the last year.  GERD -Continue Protonix  IV  DVT prophylaxis: Heparin  Advance Care Planning:   Code Status: Full Code   Consults: Orthopedic surgery  Family Communication: Patient's mother updated at bedside  Severity of Illness: The appropriate patient status for this patient is INPATIENT. Inpatient status is judged to be  reasonable and necessary in order to provide the required intensity of service to ensure the patient's safety. The patient's presenting symptoms, physical exam findings, and initial radiographic and laboratory data in the context of their chronic comorbidities is felt to place them at high risk for further clinical deterioration.  Furthermore, it is not anticipated that the patient will be medically stable for discharge from the hospital within 2 midnights of admission.   * I certify that at the point of admission it is my clinical judgment that the patient will require inpatient hospital care spanning beyond 2 midnights from the point of admission due to high intensity of service, high risk for further deterioration and high frequency of surveillance required.*  Author: Lena Qualia, MD 02/18/2024 3:16 PM  For on call review [email or web address].

## 2024-02-18 NOTE — Consult Note (Signed)
 Reason for Consult:left foot infection Referring Physician: ED  [name] is an 48 y.o. female.  HPI: Patient presented to the ED for nausea, vomiting and left foot pain.  Medical history includes CHF, polysubstance abuse, COPD, DM, GERD, HTN, seizures, bipolar disorder, ESRD.  She undergoes dialysis on Tuesday, Thursday, Saturday.  Last dialysis session was yesterday.  For the past 5 days, she has had nausea, vomiting, diarrhea, and p.o. intolerance.  She has not been able to keep anything down, including her home medications.  She has not been able to use her home Phenergan .  She endorses pain throughout her chest, abdomen, and back.  She also has a nonhealing ulcer on the plantar aspect of her left foot.  She states that this has been nonhealing for the past several years.  She has had worsened pain in this area.  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  She describes her stool and vomitus as black. She reports that she has been seen for the left foot wound at wound care for "quite a while". First issue was 7 years ago when she had a wound on her great toe which over time progressed. She has had 3 amputations on the left foot, no surgery in the past year. She did go to [facility] last week for her left foot pain/wound and was sent home on oral antibiotics, Augmentin  and doxycycline  which she ha also not been able to keep down. She states that they did a CT of her left foot, told her the "bone was dead" and set her up with a surgeon on 5/5 to discuss BKA.   Past Medical History:  Diagnosis Date   Bipolar disorder (HCC)    Chronic combined systolic and diastolic CHF (congestive heart failure) (HCC)    a. ECHO 04/23/14 LV EF: 45% - 50%,  mild LVH, diffuse hypokinesis. LV diastolic dysfunction, high ventricular filling pressure. Mild LA dilation.   Cocaine abuse (HCC)    COPD (chronic obstructive pulmonary disease) (HCC)    Diabetes mellitus without complication  (HCC)    Dyspnea    GERD (gastroesophageal reflux disease)    Heroin overdose (HCC) 10/2018   Hypertension    MRSA infection    arm wound   Nicotine  dependence with current use    Seizures (HCC)    last seizure March 2021; usual 1-2 per month    Past Surgical History:  Procedure Laterality Date   ABDOMINAL HYSTERECTOMY     ACHILLES TENDON SURGERY Left 03/31/2020   Procedure: ACHILLES LENGTHENING;  Surgeon: Pink Bridges, DPM;  Location: [facility] ORS;  Service: Podiatry;  Laterality: Left;   AMPUTATION Right 10/04/2020   Procedure: PARTIAL AMPUTATION RAY 4th and 5th;  Surgeon: Pink Bridges, DPM;  Location: [facility];  Service: Podiatry;  Laterality: Right;   AMPUTATION TOE Left 03/23/2019   Procedure: AMPUTATION TOE GREAT LEFT TOE;  Surgeon: Sharlyn Deaner, DPM;  Location: [facility];  Service: Podiatry;  Laterality: Left;   AMPUTATION TOE Left 08/16/2019   Procedure: AMPUTATION TOE 5th;  Surgeon: Pink Bridges, DPM;  Location: [facility];  Service: Podiatry;  Laterality: Left;   AMPUTATION TOE Left 02/05/2020   Procedure: AMPUTATION MPJ LEFT 4TH;  Surgeon: Pink Bridges, DPM;  Location: [facility];  Service: Podiatry;  Laterality: Left;   ANKLE FRACTURE SURGERY Left    pins/screws in place   BUBBLE STUDY  01/08/2021   Procedure: BUBBLE STUDY;  Surgeon: Lenise Quince,  MD;  Location: [facility];  Service: Cardiovascular;;   CHOLECYSTECTOMY  2005   FRACTURE SURGERY     IRRIGATION AND DEBRIDEMENT FOOT Left 10/04/2020   Procedure: IRRIGATION AND DEBRIDEMENT FOOT;  Surgeon: Pink Bridges, DPM;  Location: [facility];  Service: Podiatry;  Laterality: Left;   LOWER EXTREMITY ANGIOGRAPHY Left 03/12/2019   Procedure: LOWER EXTREMITY ANGIOGRAPHY;  Surgeon: Jackquelyn Mass, MD;  Location: [facility] INVASIVE CV LAB;  Service: Cardiovascular;  Laterality: Left;   LOWER EXTREMITY ANGIOGRAPHY Right 10/06/2020   Procedure: Lower Extremity Angiography;  Surgeon: Jackquelyn Mass, MD;  Location: [facility] INVASIVE  CV LAB;  Service: Cardiovascular;  Laterality: Right;   TEE WITHOUT CARDIOVERSION N/A 03/26/2019   Procedure: TRANSESOPHAGEAL ECHOCARDIOGRAM (TEE);  Surgeon: Sammy Crisp, MD;  Location: [facility];  Service: Cardiovascular;  Laterality: N/A;   TEE WITHOUT CARDIOVERSION N/A 01/08/2021   Procedure: TRANSESOPHAGEAL ECHOCARDIOGRAM (TEE);  Surgeon: Lenise Quince, MD;  Location: [facility] ENDOSCOPY;  Service: Cardiovascular;  Laterality: N/A;   TRANSMETATARSAL AMPUTATION Left 03/31/2020   Procedure: TRANSMETATARSAL AMPUTATION;  Surgeon: Pink Bridges, DPM;  Location: [facility] OR[facility];  Service: Podiatry;  Laterality: Left;   TUBAL LIGATION      Family History  Problem Relation Age of Onset   Hypertension Other    Drug abuse Mother    Seizures Neg Hx     Social History:  reports that she has been smoking cigarettes. She has a 28 pack-year smoking history. She has never used smokeless tobacco. She reports that she does not currently use alcohol. She reports that she does not currently use drugs.  Allergies:  Allergies  Allergen Reactions   Albumin (Human)     unknown   Erythromycin Rash   Midecamycin Rash    Medications: I have reviewed the patient's current medications.  Results for orders placed or performed during the hospital encounter of 02/18/24 (from the past 48 hours)  Comprehensive metabolic panel     Status: Abnormal   Collection Time: 02/18/24  8:45 AM  Result Value Ref Range   Sodium 134 (L) 135 - 145 mmol/L   Potassium 2.7 (LL) 3.5 - 5.1 mmol/L    Comment: CRITICAL RESULT CALLED TO, READ BACK BY AND VERIFIED WITH C,YANG RN @1010  02/18/24 E,BENTON   Chloride 92 (L) 98 - 111 mmol/L   CO2 27 22 - 32 mmol/L   Glucose, Bld 144 (H) 70 - 99 mg/dL    Comment: Glucose reference range applies only to samples taken after fasting for at least 8 hours.   BUN 11 6 - 20 mg/dL   Creatinine, Ser 1.47 (H) 0.44 - 1.00 mg/dL   Calcium  9.1 8.9 - 10.3 mg/dL   Total Protein 7.9 6.5 - 8.1 g/dL   Albumin  3.6 3.5 - 5.0 g/dL   AST 24 15 - 41 U/L   ALT 19 0 - 44 U/L   Alkaline Phosphatase 110 38 - 126 U/L   Total Bilirubin 0.6 0.0 - 1.2 mg/dL   GFR, Estimated 22 (L) >60 mL/min    Comment: (NOTE) Calculated using the CKD-EPI Creatinine Equation (2021)    Anion gap 15 5 - 15    Comment: Performed at [facility], [address] [place], [place] [address]  Lipase, blood     Status: Abnormal   Collection Time: 02/18/24  8:45 AM  Result Value Ref Range   Lipase 100 (H) 11 - 51 U/L    Comment: Performed at [facility], [address], [place], [place] [address]  CBC with Diff     Status: Abnormal   Collection Time: 02/18/24  8:45 AM  Result Value Ref Range   WBC 5.9 4.0 - 10.5 K/uL   RBC 3.75 (L) 3.87 - 5.11 MIL/uL   Hemoglobin 11.8 (L) 12.0 - 15.0 g/dL   HCT 60.4 (L) 54.0 - 98.1 %   MCV 95.7 80.0 - 100.0 fL   MCH 31.5 26.0 - 34.0 pg   MCHC 32.9 30.0 - 36.0 g/dL   RDW 19.1 47.8 - 29.5 %   Platelets 403 (H) 150 - 400 K/uL   nRBC 0.0 0.0 - 0.2 %   Neutrophils Relative % 75 %   Neutro Abs 4.4 1.7 - 7.7 K/uL   Lymphocytes Relative 14 %   Lymphs Abs 0.8 0.7 - 4.0 K/uL   Monocytes Relative 7 %   Monocytes Absolute 0.4 0.1 - 1.0 K/uL   Eosinophils Relative 3 %   Eosinophils Absolute 0.2 0.0 - 0.5 K/uL   Basophils Relative 1 %   Basophils Absolute 0.1 0.0 - 0.1 K/uL   Immature Granulocytes 0 %   Abs Immature Granulocytes 0.01 0.00 - 0.07 K/uL    Comment: Performed at [facility], [address] [place], [place] [address]  Magnesium      Status: None   Collection Time: 02/18/24  8:45 AM  Result Value Ref Range   Magnesium  1.8 1.7 - 2.4 mg/dL    Comment: Performed at [facility], [address], [place], [place] [address]  Troponin I (High Sensitivity)     Status: Abnormal   Collection Time: 02/18/24  8:45 AM  Result Value Ref Range   Troponin I (High Sensitivity) 20 (H) <18 ng/L    Comment: (NOTE) Elevated high sensitivity troponin I (hsTnI) values and  significant  changes across serial measurements may suggest ACS but many other  chronic and acute conditions are known to elevate hsTnI results.  Refer to the "Links" section for chest pain algorithms and additional  guidance. Performed at [facility], [address] [place], [place] [address]     DG Foot 2 Views Left Result Date: 02/18/2024 CLINICAL DATA:  Nonhealing ulcer.  Pain. EXAM: LEFT FOOT - 2 VIEW COMPARISON:  03/23/2024. FINDINGS: Plate and screw fixation of the distal fibula with 2 cannulated screws fixating the medial malleolus as before. 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 Soft tissue ulceration is identified along the medial plantar surface of the foot underlying the navicular bone and medial cuneiform. There is progressive bony erosion along the posteromedial aspect of the medial cuneiform and anterolateral aspect of the navicular bone. No signs of acute fracture. IMPRESSION: 1. Soft tissue ulceration along the medial plantar surface of the foot adjacent to the navicular bone and medial cuneiform. 2. Progressive bony erosion along the posteromedial aspect of the medial cuneiform and anterolateral aspect of the navicular bone compatible with osteomyelitis. 3. 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 Electronically Signed   By: Kimberley Penman M.D.   On: 02/18/2024 09:15    Review of Systems  Constitutional:  Positive for activity change, appetite change and fatigue. Negative for chills and fever.  Respiratory:  Negative for shortness of breath and wheezing.   Cardiovascular:  Negative for chest pain and palpitations.  Gastrointestinal:  Positive for abdominal pain, diarrhea, nausea and vomiting.  Musculoskeletal:  Positive for arthralgias, back pain and myalgias.  Psychiatric/Behavioral:  Negative for behavioral problems and confusion.  Blood pressure 116/63, pulse 71, temperature 98.3 F (36.8 C), temperature source Oral,  resp. rate 15, SpO2 99%. Physical Exam Constitutional:      General: She is not in acute distress.    Appearance: Normal appearance. She is normal weight. She is ill-appearing.  HENT:     Head: Normocephalic and atraumatic.  Eyes:     Extraocular Movements: Extraocular movements intact.  Cardiovascular:     Rate and Rhythm: Normal rate.     Pulses: Normal pulses.  Pulmonary:     Effort: Pulmonary effort is normal.  Abdominal:     Tenderness: There is abdominal tenderness.  Musculoskeletal:     Cervical back: Normal range of motion.     Comments: Right foot: Amputated 4th-5th toes. Healing wound on the heal with no drainage noted. Wound size less than 1mm total area.  Dressing from wound care C/D/I. No tenderness to palpation about the right foot. Able to dorsiflex and plantar flex. Sensation to light touch and pressure intact but not 2 point discrimination. No swelling or erythema noted.  Left foot: Transmet amputation. Quarter size wound about the medial foot, plantar aspect. Mild serosanguinous drainage with no surrounding erythema and stable margins. Able to dorsiflex and plantar flex the left foot. Sensation to light touch and pressure intact but not 2 point discrimination. No swelling or erythema noted. Mild tenderness to palpation about the left foot.  No tenderness to palpation about the ankles, lower legs, or knees. No swelling, erythema, or joint effusions noted ankles or knees.   Neurological:     General: No focal deficit present.     Mental Status: She is alert and oriented to person, place, and time.  Psychiatric:        Mood and Affect: Mood normal.        Behavior: Behavior normal.     Assessment/Plan: Left foot chronic foot ulcer Patient has a long history of chronic foot ulcer on the left foot. Wound itself looks relatively stable on exam but patient wound likely benefit from BKA. This is not an urgent matter. Cannot locate CT results of the left foot from [facility] but MRI  has been ordered by ED physician to address osteomyelitis, abscess. Medicine will likely admit patient for management of systemic symptoms which may or may not be secondary to the foot infection. Depending on MRI results and patient overall presentation over the next day or two, patient may move forward with BKA here this week with Dr Hulda Mage or may return to [facility] for appt on 5/5. Ortho to follow.  Diamonique Ruedas L. [place], PA-C 02/18/2024, 11:34 AM

## 2024-02-18 NOTE — Progress Notes (Addendum)
 Pharmacy Antibiotic Note  [name] is a 48 y.o. female for which pharmacy has been consulted for vancomycin  and zosyn  dosing for  osteomyelitis .  Patient with a history of HF, substance abuse, COPD, DM, GERD, HTN, seizures, bipolar disorder, ESRD on HD TuThSa. Patient presenting with N/V. Last HD 4/26.  Left Ft XR with signs c/w osteomyelitis. MRI is pending.  WBC 5.9; T 98.3; HR 75; RR 16  Plan: Zosyn  2.25g q8h Vancomycin  1250 mg once then 500 mg IV vancomycin  post-HD (assuming full HD session completed) Monitor WBC, fever, renal function, cultures De-escalate when able F/u Nephrology plan     Temp (24hrs), Avg:98.3 F (36.8 C), Min:98.3 F (36.8 C), Max:98.3 F (36.8 C)  Recent Labs  Lab 02/18/24 0845  WBC 5.9  CREATININE 2.61*    CrCl cannot be calculated (Unknown ideal weight.).    Allergies  Allergen Reactions   Albumin (Human)     unknown   Erythromycin Rash   Midecamycin Rash   Microbiology results: Pending  Thank you for allowing pharmacy to be a part of this patient's care.  Dionicio Fray, PharmD, BCPS 02/18/2024 10:30 AM ED Clinical Pharmacist -  [phone]

## 2024-02-18 NOTE — ED Triage Notes (Signed)
 Pt BIB [facility] for N/V/D since Tuesday. Pt reports 10/10 L foot pain with chronic wound on L foot. Pt reports stool and emesis is "really black". Pt reports no drug use for over a year. Pt is on dialysis and went yesterday. VSS per EMS besides O2 drop to 84% RA, EMS placed on [facility].

## 2024-02-18 NOTE — Progress Notes (Signed)
 ANTICOAGULATION CONSULT NOTE  Pharmacy Consult for Heparin  Indication: DVT  Allergies  Allergen Reactions   Albumin (Human)     unknown   Erythromycin Rash   Midecamycin Rash    Patient Measurements:   Heparin  Dosing Weight: 59 kg  Vital Signs: Temp: 98.3 F (36.8 C) (04/27 1330) Temp Source: Oral (04/27 1330) BP: 101/62 (04/27 1330) Pulse Rate: 60 (04/27 1330)  Labs: Recent Labs    02/18/24 0845 02/18/24 1049  HGB 11.8*  --   HCT 35.9*  --   PLT 403*  --   CREATININE 2.61*  --   TROPONINIHS 20* 20*    CrCl cannot be calculated (Unknown ideal weight.).   Medical History: Past Medical History:  Diagnosis Date   Bipolar disorder (HCC)    Chronic combined systolic and diastolic CHF (congestive heart failure) (HCC)    a. ECHO 04/23/14 LV EF: 45% - 50%,  mild LVH, diffuse hypokinesis. LV diastolic dysfunction, high ventricular filling pressure. Mild LA dilation.   Cocaine abuse (HCC)    COPD (chronic obstructive pulmonary disease) (HCC)    Diabetes mellitus without complication (HCC)    Dyspnea    GERD (gastroesophageal reflux disease)    Heroin overdose (HCC) 10/2018   Hypertension    MRSA infection    arm wound   Nicotine  dependence with current use    Seizures (HCC)    last seizure March 2021; usual 1-2 per month    Medications:  (Not in a hospital admission)  Scheduled:   metoCLOPramide  (REGLAN ) injection  10 mg Intravenous Q8H   pantoprazole  (PROTONIX ) IV  40 mg Intravenous Q12H   potassium chloride   40 mEq Oral Once   sodium chloride  flush  3 mL Intravenous Q12H   Infusions:   piperacillin -tazobactam (ZOSYN )  IV Stopped (02/18/24 1503)   potassium chloride      promethazine  (PHENERGAN ) injection (IM or IVPB)     promethazine  (PHENERGAN ) injection (IM or IVPB)     [START ON 02/20/2024] vancomycin      PRN: acetaminophen  **OR** acetaminophen , albuterol , promethazine  (PHENERGAN ) injection (IM or IVPB)  Assessment: 47 yof with a history of DVT  started on eliquis  in March, HF, substance abuse, COPD, DM, GERD, HTN, seizures, bipolar disorder, ESRD on HD TuThSa. Patient is presenting with nausea, vomiting and left foot pain . Heparin  per pharmacy consult placed for DVT while holding eliquis .  Patient is on apixaban  prior to arrival. Last dose 02/13/24 per patient but med rec technician is stating concerns of reliability of history at this time. Will require aPTT monitoring due to likely falsely high anti-Xa level secondary to DOAC use.  Hgb 11.8; plt 403  Goal of Therapy:  Heparin  level 0.3-0.7 units/ml aPTT 66-102 seconds Monitor platelets by anticoagulation protocol: Yes   Plan:  Will get baseline HL/aPTT No initial heparin  bolus -- if baseline HL is non-detectable I may order bolus and re-time 8 hr levels given how recent DVT occurred.  Start heparin  infusion at 1050 units/hr Check aPTT & anti-Xa level in 8 hours and daily while on heparin  Continue to monitor via aPTT until levels are correlated Continue to monitor H&H and platelets  Dionicio Fray, PharmD, BCPS 02/18/2024 4:07 PM ED Clinical Pharmacist -  [phone]

## 2024-02-18 NOTE — Progress Notes (Signed)
 Tele box [id number]

## 2024-02-19 ENCOUNTER — Encounter: Payer: MEDICAID | Admitting: Physician Assistant

## 2024-02-19 ENCOUNTER — Encounter (HOSPITAL_COMMUNITY): Payer: Self-pay | Admitting: Internal Medicine

## 2024-02-19 DIAGNOSIS — R112 Nausea with vomiting, unspecified: Secondary | ICD-10-CM | POA: Diagnosis not present

## 2024-02-19 LAB — BASIC METABOLIC PANEL WITH GFR
Anion gap: 17 — ABNORMAL HIGH (ref 5–15)
BUN: 17 mg/dL (ref 6–20)
CO2: 24 mmol/L (ref 22–32)
Calcium: 9.3 mg/dL (ref 8.9–10.3)
Chloride: 95 mmol/L — ABNORMAL LOW (ref 98–111)
Creatinine, Ser: 2.99 mg/dL — ABNORMAL HIGH (ref 0.44–1.00)
GFR, Estimated: 19 mL/min — ABNORMAL LOW (ref >60)
Glucose, Bld: 107 mg/dL — ABNORMAL HIGH (ref 70–99)
Potassium: 2.9 mmol/L — ABNORMAL LOW (ref 3.5–5.1)
Sodium: 136 mmol/L (ref 135–145)

## 2024-02-19 LAB — CBC
HCT: 36.5 % (ref 36.0–46.0)
Hemoglobin: 11.9 g/dL — ABNORMAL LOW (ref 12.0–15.0)
MCH: 31.5 pg (ref 26.0–34.0)
MCHC: 32.6 g/dL (ref 30.0–36.0)
MCV: 96.6 fL (ref 80.0–100.0)
Platelets: 395 10*3/uL (ref 150–400)
RBC: 3.78 MIL/uL — ABNORMAL LOW (ref 3.87–5.11)
RDW: 13.3 % (ref 11.5–15.5)
WBC: 6.7 10*3/uL (ref 4.0–10.5)
nRBC: 0 % (ref 0.0–0.2)

## 2024-02-19 LAB — HEPARIN LEVEL (UNFRACTIONATED): Heparin Unfractionated: 0.36 [IU]/mL (ref 0.30–0.70)

## 2024-02-19 LAB — HEPATITIS B SURFACE ANTIGEN: Hepatitis B Surface Ag: NONREACTIVE

## 2024-02-19 MED ORDER — HEPARIN BOLUS VIA INFUSION
1000.0000 [IU] | Freq: Once | INTRAVENOUS | Status: AC
  Administered 2024-02-19: 1000 [IU] via INTRAVENOUS
  Filled 2024-02-19: qty 1000

## 2024-02-19 MED ORDER — HEPARIN SODIUM (PORCINE) 1000 UNIT/ML IJ SOLN: 4000.0000 [IU] | Freq: Once | INTRAMUSCULAR | Status: DC

## 2024-02-19 MED ORDER — CHLORHEXIDINE GLUCONATE CLOTH 2 % EX PADS
6.0000 | MEDICATED_PAD | Freq: Every day | CUTANEOUS | Status: DC
  Administered 2024-02-20: 6 via TOPICAL

## 2024-02-19 MED ORDER — POTASSIUM CHLORIDE 10 MEQ/100ML IV SOLN
10.0000 meq | INTRAVENOUS | Status: AC
  Administered 2024-02-19 (×4): 10 meq via INTRAVENOUS
  Filled 2024-02-19 (×4): qty 100

## 2024-02-19 NOTE — Consult Note (Signed)
 WOC Nurse Consult Note: patient with longstanding wounds to R heel and L plantar foot followed at [facility]; has been evaluated by ortho outside the system who recommended L BKA; last seen at [facility] 02/12/2024 and ordered Hydrofera Blue  Hydrofera Blue is not a product on formulary at [facility]; will use substitute of silver hydrofiber (Aquacel AG)  Reason for Consult: L foot and R heel wounds  Wound type: full thickness likely related to neuropathy/diabetes  Pressure Injury POA: NA  Measurement: see nursing flowsheet; per [facility] 02/12/2024 L plantar foot 2.2 cm x 1.4 cm x 0.8 cm, R calcaneus 0.2 cm x 0.4 cm x 0.2 cm  Wound bed: R calcaneus/plantar heel 100% dark tissue; L medial foot 70% red 10% brown fibrinous and 20% dark eschar circumferential around wound  Drainage (amount, consistency, odor)  see nursing flowsheet  Periwound: intact  Dressing procedure/placement/frequency: Cleanse R plantar heel and L medial foot wounds with Vashe wound cleanser [employer] [phone]), do not rinse and alow to air dry.  Apply silver hydrofiber (Aquacel AG Lawson [phone]) to wound beds daily, cover with dry gauze and secure with Kerlix roll gauze.  If R heel has depth may cut silver into a strip and using a Q tip applicator insert into wound bed.  MOISTEN SILVER WITH SALINE FOR ATRAUMATIC REMOVAL IF STUCK TO WOUND BED.   POC discussed with bedside nurse. WOC team will not follow. Patient should continue to follow with orthopedics and/or wound care center for ongoing management of these wounds. Patient to resume Hydrofera Blue at discharge.    Thank you,    Ronni Colace MSN, RN-BC, [employer] [phone]

## 2024-02-19 NOTE — Hospital Course (Addendum)
  Assessment and Plan:   Intractable nausea and vomiting CT scan of the abdomen and pelvis without any symptoms.  Question gastroparesis.  Phenergan  seems to help.  EGD last year at [facility] on 02/2023 was normal.  Continue IV PPI Phenergan  and Reglan .  Right upper quadrant ultrasound shows normal sonographic appearance of the liver with no biliary dilatation and status postcholecystectomy.    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  MRI of the foot highly suspicious for osteomyelitis involving the navicular bone proximal aspect of the medial cuneiform, middle cuneiform and lateral cuneiform.  Follow-up blood cultures.  ESR elevated at 45.  CRP less than 0.5.  Currently empirically on vancomycin  and Zosyn .  Orthopedics has been consulted and recommend BKA depending upon clinical improvement.   Hypokalemia Patient has a history of significantly low potassium of 2.7 on presentation due to nausea vomiting.  Received potassium IV.  BMP from today pending.    History of DVT Recently diagnosed of DVT of the left leg on 12/2023 and was started on Eliquis .  Last dose of Eliquis  was approximately 5 days ago.  Continue with heparin  due to poor oral tolerance.   Diabetes mellitus type 2, without long-term use of insulin  On admission glucose was noted to be 144.  Last available hemoglobin A1c was 5.7 on 03/21/2023.  Currently no longer on insulin .  Repeat hemoglobin A1c of 5.4.   ESRD on HD On Tuesdays Thursdays Saturday schedule.  Nephrology has been notified for dialysis needs.   Peripheral vascular disease Hyperlipidemia Will start Lipitor 1 p.o. okay   Bipolar disorder Patient reportedly had been prescribed Lexapro but has not started the medication yet.   Polysubstance  abuse Patient reports that she has not used drugs or alcohol in the last year.   GERD -Continue Protonix  IV

## 2024-02-19 NOTE — Progress Notes (Signed)
 ANTICOAGULATION CONSULT NOTE  Pharmacy Consult for Heparin  Indication: DVT  Allergies  Allergen Reactions   Albumin (Human)     unknown   Erythromycin Rash   Midecamycin Rash    Patient Measurements:   Heparin  Dosing Weight: 59 kg  Vital Signs: Temp: 98 F (36.7 C) (04/27 2033) Temp Source: Oral (04/27 1330) BP: 125/78 (04/27 2033) Pulse Rate: 65 (04/27 1658)  Labs: Recent Labs    02/18/24 0845 02/18/24 1049 02/18/24 1603 02/18/24 2337  HGB 11.8*  --   --   --   HCT 35.9*  --   --   --   PLT 403*  --   --   --   APTT  --   --  29 42*  HEPARINUNFRC  --   --  <0.10* 0.20*  CREATININE 2.61*  --   --   --   TROPONINIHS 20* 20*  --   --     CrCl cannot be calculated (Unknown ideal weight.).   Assessment: 42 yof with a history of DVT started on eliquis  in March, HF, substance abuse, COPD, DM, GERD, HTN, seizures, bipolar disorder, ESRD on HD TuThSa. Patient is presenting with nausea, vomiting and left foot pain . Heparin  per pharmacy consult placed for DVT while holding eliquis .  Patient is on apixaban  prior to arrival. Last dose 02/13/24 per patient but med rec technician is stating concerns of reliability of history at this time. Will require aPTT monitoring due to likely falsely high anti-Xa level secondary to DOAC use.  AM: heparin  level 0.2 with aPTT 42 seconds. Per RN, no issues with the heparin  running continuously or signs/symptoms of bleeding. Given levels correlated at baseline will utilize heparin  levels.  Hgb 11.8; plt 403  Goal of Therapy:  Heparin  level 0.3-0.7 units/ml aPTT 66-102 seconds Monitor platelets by anticoagulation protocol: Yes   Plan:  Small heparin  bolus of 1000 units Increase heparin  infusion to 1200 units/hr Check anti-Xa level in 8 hours and daily while on heparin  Continue to monitor H&H and platelets  Young Hensen, PharmD. Clinical Pharmacist 02/19/2024 12:10 AM

## 2024-02-19 NOTE — Progress Notes (Signed)
 Pt A&Ox 4. VSS, on room air, sinus rhythm on the cardiac monitor. Pt asymptomatic. Tolerating soft diet. Left foot dressing, clean, dry and intact.  Heparin  drip infusing as ordered- see emar.  Safety maintained. Will continue to monitor.  2145: pt went to dialysis treatment

## 2024-02-19 NOTE — Consult Note (Signed)
 Renal Service Consult Note [facility]  [name] 02/19/2024 Lynae Sandifer, MD Requesting Physician: Dr. Efrain Grant  Reason for Consult: ESRD patient with nausea and vomiting HPI: The patient is a 48 y.o. year-old w/ PMH as below who presented to ED yesterday morning complaining of nausea and vomiting and diarrhea since 5 days prior.  Also 10/10 left foot pain due to a chronic wound on left foot.  Patient has not missed dialysis and went to dialysis on Saturday 4/26.  ED vital signs stable, patient afebrile, Hgb 11, K+ 3.7, creatinine 2.6.  CT of chest and abdomen showed no acute changes.  MRI of the foot was pending.  X-ray of the left foot showed findings compatible with osteomyelitis.  Patient was started on IV antibiotics with Zosyn  and vancomycin  and admitted.  Patient was seen in consultation by orthopedics who recommended possible BKA depending on clinical improvement.  Renal service consulted for dialysis.   Pt seen in patient's room.  Patient has no complaints denies any shortness of breath or stomach pain or chest pain at this time.   ROS - denies CP, no joint pain, no HA, no blurry vision, no rash, no diarrhea, no nausea/ vomiting  PMH: Bipolar disorder Chronic combined systolic and diastolic CHF History of cocaine abuse COPD Diabetes type 2 GERD Hypertension History of intractable nausea and vomiting History of seizures ESRD on dialysis  Past Surgical History  Past Surgical History:  Procedure Laterality Date   ABDOMINAL HYSTERECTOMY     ACHILLES TENDON SURGERY Left 03/31/2020   Procedure: ACHILLES LENGTHENING;  Surgeon: Pink Bridges, DPM;  Location: [facility] ORS;  Service: Podiatry;  Laterality: Left;   AMPUTATION Right 10/04/2020   Procedure: PARTIAL AMPUTATION RAY 4th and 5th;  Surgeon: Pink Bridges, DPM;  Location: [facility];  Service: Podiatry;  Laterality: Right;   AMPUTATION TOE Left 03/23/2019   Procedure: AMPUTATION TOE GREAT LEFT TOE;   Surgeon: Sharlyn Deaner, DPM;  Location: [facility];  Service: Podiatry;  Laterality: Left;   AMPUTATION TOE Left 08/16/2019   Procedure: AMPUTATION TOE 5th;  Surgeon: Pink Bridges, DPM;  Location: [facility];  Service: Podiatry;  Laterality: Left;   AMPUTATION TOE Left 02/05/2020   Procedure: AMPUTATION MPJ LEFT 4TH;  Surgeon: Pink Bridges, DPM;  Location: [facility];  Service: Podiatry;  Laterality: Left;   ANKLE FRACTURE SURGERY Left    pins/screws in place   BUBBLE STUDY  01/08/2021   Procedure: BUBBLE STUDY;  Surgeon: Lenise Quince, MD;  Location: [facility] ENDOS[facility];  Service: Cardiovascular;;   CHOLECYSTECTOMY  2005   FRACTURE SURGERY     IRRIGATION AND DEBRIDEMENT FOOT Left 10/04/2020   Procedure: IRRIGATION AND DEBRIDEMENT FOOT;  Surgeon: Pink Bridges, DPM;  Location: [facility] OR[facility];  Service: Podiatry;  Laterality: Left;   LOWER EXTREMITY ANGIOGRAPHY Left 03/12/2019   Procedure: LOWER EXTREMITY ANGIOGRAPHY;  Surgeon: Jackquelyn Mass, MD;  Location: [facility] INVASIVE CV LAB;  Service: Cardiovascular;  Laterality: Left;   LOWER EXTREMITY ANGIOGRAPHY Right 10/06/2020   Procedure: Lower Extremity Angiography;  Surgeon: Jackquelyn Mass, MD;  Location: [facility] INVASIVE CV LAB;  Service: Cardiovascular;  Laterality: Right;   TEE WITHOUT CARDIOVERSION N/A 03/26/2019   Procedure: TRANSESOPHAGEAL ECHOCARDIOGRAM (TEE);  Surgeon: Sammy Crisp, MD;  Location: [facility] OR[facility];  Service: Cardiovascular;  Laterality: N/A;   TEE WITHOUT CARDIOVERSION N/A 01/08/2021   Procedure: TRANSESOPHAGEAL ECHOCARDIOGRAM (TEE);  Surgeon: Lenise Quince, MD;  Location: [facility] ENDOSCOPY;  Service: Cardiovascular;  Laterality: N/A;   TRANSMETATARSAL AMPUTATION  Left 03/31/2020   Procedure: TRANSMETATARSAL AMPUTATION;  Surgeon: Pink Bridges, DPM;  Location: [facility] OR[facility];  Service: Podiatry;  Laterality: Left;   TUBAL LIGATION     Family History  Family History  Problem Relation Age of Onset   Hypertension Other    Drug abuse Mother     Seizures Neg Hx    Social History  reports that she has been smoking cigarettes. She has a 28 pack-year smoking history. She has never used smokeless tobacco. She reports that she does not currently use alcohol. She reports that she does not currently use drugs. Allergies  Allergies  Allergen Reactions   Albumin (Human)     unknown   Erythromycin Rash   Midecamycin Rash   Home medications Prior to Admission medications   Medication Sig Start Date End Date Taking? Authorizing Provider  acetaminophen  (TYLENOL ) 325 MG tablet Take 2 tablets (650 mg total) by mouth every 6 (six) hours as needed for mild pain (or Fever >/= 101). 04/02/20  Yes Dhungel, Nishant, MD  amoxicillin -clavulanate (AUGMENTIN ) 500-125 MG tablet Take 1 tablet by mouth. Unknown frequency.   Yes [provider]  APIXABAN  (ELIQUIS ) VTE STARTER PACK (10MG  AND 5MG ) Take as directed on package: start with two-5mg  tablets twice daily for 7 days. On day 8, switch to one-5mg  tablet twice daily. 01/22/24  Yes Kandee Orion, MD  atorvastatin  (LIPITOR) 40 MG tablet Take 40 mg by mouth daily. 12/20/20  Yes [provider]  azelastine (ASTELIN) 0.1 % nasal spray Place 2 sprays into both nostrils 2 (two) times daily. Use 1-2 sprays in each nostril as directed   Yes [provider]  doxycycline  (VIBRAMYCIN ) 100 MG capsule Take 100 mg by mouth 2 (two) times daily. For 7 days.   Yes [provider]  escitalopram (LEXAPRO) 10 MG tablet Take 10 mg by mouth daily.   Yes [provider]  oxyCODONE -acetaminophen  (PERCOCET) 5-325 MG tablet Take 1 tablet by mouth every 6 (six) hours as needed for severe pain (pain score 7-10) or moderate pain (pain score 4-6). 01/24/24  Yes Bradler, Evan K, MD  pantoprazole  (PROTONIX ) 20 MG tablet Take 20 mg by mouth 2 (two) times daily. 08/21/18  Yes [provider]  promethazine  (PHENERGAN ) 25 MG tablet Take 25 mg by mouth every 6 (six) hours as needed for nausea or  vomiting.   Yes [provider]     Vitals:   02/19/24 0005 02/19/24 0522 02/19/24 0759 02/19/24 1244  BP: 136/64 (!) 155/81 (!) 161/73 (!) 147/79  Pulse:  62 (!) 102 70  Resp:   19 18  Temp: 98 F (36.7 C) 98 F (36.7 C)  98.7 F (37.1 C)  TempSrc: Oral Oral  Oral  SpO2: 100% 100% (!) 76% 100%   Exam Gen alert, no distress, on room air 100% sats No rash, cyanosis or gangrene Sclera anicteric, throat clear  No jvd or bruits Chest clear bilat to bases, no rales/ wheezing RRR no MRG Abd soft ntnd no mass or ascites +bs GU defer MS no joint effusions or deformity Ext no LE or UE edema, no other edema Neuro is alert, Ox 3 , nf    R UA AVF positive bruit      Renal-related home meds: None Others: Protonix , Percocet, Lexapro, Lipitor, PRNs    OP HD: MWF in [place], followed by [employer] [facility] - Get records    Assessment/ Plan: Intractable nausea and vomiting: Per primary MD Chronic left foot ulcer  with osteomyelitis: Started IV antibiotics with vancomycin  and Zosyn , per primary MD. ESRD: on HD MWF in [facility].  No acute needs for dialysis, will plan on dialyzing her later today or tomorrow. HTN: Blood pressures are stable, follow Volume: No gross volume excess, patient is on room air. Anemia of esrd: Hgb 11.8, follow Secondary hyperparathyroidism: Corrected calcium  in range, continue binders with meals, get records. H/o DVT Diabetes mellitus Bipolar disorder      Larry Poag  MD [facility] 02/19/2024, 2:59 PM  Recent Labs  Lab 02/18/24 0845 02/19/24 0904  HGB 11.8* 11.9*  ALBUMIN 3.6  --   CALCIUM  9.1 9.3  CREATININE 2.61* 2.99*  K 2.7* 2.9*   Inpatient medications:  metoCLOPramide  (REGLAN ) injection  10 mg Intravenous Q8H   pantoprazole  (PROTONIX ) IV  40 mg Intravenous Q12H   potassium chloride   40 mEq Oral Once   sodium chloride  flush  3 mL Intravenous Q12H    heparin  1,200 Units/hr (02/19/24 1403)   piperacillin -tazobactam (ZOSYN )  IV  Stopped (02/19/24 1437)   potassium chloride  10 mEq (02/19/24 1444)   promethazine  (PHENERGAN ) injection (IM or IVPB)     [START ON 02/20/2024] vancomycin      acetaminophen  **OR** acetaminophen , albuterol , promethazine  (PHENERGAN ) injection (IM or IVPB)

## 2024-02-19 NOTE — Progress Notes (Signed)
 Pt receives out-pt HD at [facility] on MWF. Per nephrologist request, contacted clinic to request that pt's HD orders and med list please be faxed to inpt HD unit. Update provided to nephrologist regarding where info will be faxed to for review. Will assist as needed.   Lauraine Polite Renal Navigator [phone]

## 2024-02-19 NOTE — Progress Notes (Signed)
 ANTICOAGULATION CONSULT NOTE  Pharmacy Consult for Heparin  Indication: DVT  Allergies  Allergen Reactions   Albumin (Human)     unknown   Erythromycin Rash   Midecamycin Rash    Patient Measurements:   Heparin  Dosing Weight: 59 kg  Vital Signs: Temp: 98 F (36.7 C) (04/28 0522) Temp Source: Oral (04/28 0522) BP: 161/73 (04/28 0759) Pulse Rate: 102 (04/28 0759)  Labs: Recent Labs    02/18/24 0845 02/18/24 1049 02/18/24 1603 02/18/24 2337 02/19/24 0904  HGB 11.8*  --   --   --  11.9*  HCT 35.9*  --   --   --  36.5  PLT 403*  --   --   --  395  APTT  --   --  29 42*  --   HEPARINUNFRC  --   --  <0.10* 0.20* 0.36  CREATININE 2.61*  --   --   --  2.99*  TROPONINIHS 20* 20*  --   --   --     CrCl cannot be calculated (Unknown ideal weight.).   Assessment: 69 yof with a history of DVT started on eliquis  in March, HF, substance abuse, COPD, DM, GERD, HTN, seizures, bipolar disorder, ESRD on HD [facility]. Patient is presenting with nausea, vomiting and left foot pain . Heparin  per pharmacy consult placed for DVT while holding eliquis .  Patient is on apixaban  prior to arrival. Last dose 02/13/24 per patient but med rec technician is stating concerns of reliability of history at this time. Will require aPTT monitoring due to likely falsely high anti-Xa level secondary to DOAC use.  AM: heparin  level 0.36 which is therapeutic.   Hgb 11.9; plt 395  Goal of Therapy:  Heparin  level 0.3-0.7 units/ml aPTT 66-102 seconds Monitor platelets by anticoagulation protocol: Yes   Plan:  Continue heparin  infusion at 1200 units/hr Check anti-Xa level daily while on heparin  Continue to monitor H&H and platelets  Lovina Ruddle, PharmD, [facility] Clinical Pharmacist Please see AMION for all Pharmacists' Contact Phone Numbers 02/19/2024, 10:52 AM

## 2024-02-19 NOTE — Progress Notes (Signed)
 PROGRESS NOTE  [name] YNW:[id number] DOB: 1976/04/22 DOA: 02/18/2024 PCP: Darylene Epley, MD   LOS: 1 day   Brief narrative:  [name] is a 48 y.o. female with medical history significant of heart failure with preserved EF, type 2 diabetes on insulin , hypertension, COPD, ESRD Tuesday Thursday Saturday, seizure disorder, bipolar disorder, history of substance abuse, neuropathy, DVT of the left popliteal vein on anticoagulation presented hospital with nausea vomiting for 5 days with diffuse abdominal pain. Records note a previous hospitalization at [facility] in 02/2023 for nausea and vomiting with concern for hematemesis and anemia for which patient underwent a EGD that was normal without signs of bleeding.  She had just recently been diagnosed with a DVT of the left leg in March and started on Eliquis , but last took the medication 5 days ago due to her symptoms.  Patient also reported nonhealing ulcer on the plantar aspect of her left foot and had 3 surgeries, being followed by wound care in the outpatient setting.  Patient reports that she was set up to see a surgeon on 5 5 to discuss about below-knee amputation and was sent on Augmentin  and doxycycline  last week.  She however has not been able to keep antibiotics down.    In the ED patient was afebrile.  Labs showed sodium of 134 with creatinine elevation at 2.6.  High-sensitivity troponin of 20.  X-ray of the left foot revealed soft tissue ulceration along the medial plantar surface of the foot with progressive bony erosion on the posterior medial aspect of the medial cuneiform and anterolateral aspect of the navicular bone compatible with osteomyelitis.  CT scan of the chest abdomen pelvis was unremarkable.  Patient was then considered for admission to the hospital for further evaluation and treatment.   Assessment/Plan: Principal Problem:   Intractable nausea and vomiting Active Problems:   Osteomyelitis of left foot  (HCC)   Chronic ulcer of left foot (HCC)   Hypokalemia   History of DVT (deep vein thrombosis)   Diabetes mellitus type 2 with complications (HCC)   ESRD on hemodialysis (HCC)   Hyperlipidemia   PVD (peripheral vascular disease) (HCC)   Polysubstance abuse (HCC)   GERD (gastroesophageal reflux disease)  Intractable nausea and vomiting CT scan of the abdomen and pelvis without any symptoms.  Question gastroparesis.  Phenergan  seems to help.  EGD last year at [facility] on 02/2023 was normal.  Continue IV PPI Phenergan  and Reglan .  Right upper quadrant ultrasound shows normal sonographic appearance of the liver with no biliary dilatation and status postcholecystectomy.  Patient states that she feels hungry and has been able to tolerate clears.  Will advance to full liquids today.  Advance to solid diet by the evening if continues to improve.  Chronic ulcer of the left foot with osteomyelitis  Patient reports having an ulcer on the left foot present for multiple years reported having CT at [facility] that reported that the foot likely had osteomyelitis and was referred to a surgeon below-knee amputation.  MRI of the foot highly suspicious for osteomyelitis involving the navicular bone proximal aspect of the medial cuneiform, middle cuneiform and lateral cuneiform.  Follow-up blood cultures.  ESR elevated at 45.  CRP less than 0.5.  Currently empirically on vancomycin  and Zosyn .  Orthopedics has been consulted and recommend BKA depending upon clinical improvement.  Patient is to follow-up with [facility] and has an appointment May 5th.   Hypokalemia Patient has a history of significantly  low potassium of 2.7 on presentation due to nausea vomiting.  Received potassium IV.  BMP from today with potassium of 2.9.  Will replace with 40 mEq of IV potassium today.  Check BMP in AM.  History of DVT Recently diagnosed of DVT of the left leg on 12/2023 and was started on Eliquis .  Last dose of Eliquis  was  approximately 5 days ago.  Continue with heparin  due to poor oral tolerance.   Diabetes mellitus type 2, without long-term use of insulin  On admission glucose was noted to be 144.  Last available hemoglobin A1c was 5.7 on 03/21/2023.  Currently no longer on insulin .  Repeat hemoglobin A1c of 5.4.   ESRD on HD On Tuesdays Thursdays Saturday schedule.  Nephrology has been notified for dialysis needs.   Peripheral vascular disease Hyperlipidemia Will start Lipitor 1 p.o. okay   Bipolar disorder Patient reportedly had been prescribed Lexapro but has not started the medication yet.   Polysubstance abuse Patient reports that she has not used drugs or alcohol in the last year.  Appears to be fidgety at times.   GERD -Continue Protonix  IV   DVT prophylaxis: Heparin  drip.   Disposition: Home likely 02/20/2024 after hemodialysis if continues to improve and tolerating oral diet.  Status is: Inpatient Remains inpatient appropriate because: Poor oral intolerance, electrolyte imbalance, pending clinical improvement, IV antibiotics    Code Status:     Code Status: Full Code  Family Communication: None at bedside  Consultants: None  Procedures: None  Anti-infectives:  Vancomycin  and Zosyn   Anti-infectives (From admission, onward)    Start     Dose/Rate Route Frequency Ordered Stop   02/20/24 1200  vancomycin  (VANCOREADY) IVPB 500 mg/100 mL        500 mg 100 mL/hr over 60 Minutes Intravenous Every T-Th-Sa (Hemodialysis) 02/18/24 1036     02/18/24 1400  piperacillin -tazobactam (ZOSYN ) IVPB 2.25 g        2.25 g 100 mL/hr over 30 Minutes Intravenous Every 8 hours 02/18/24 1036     02/18/24 1045  vancomycin  (VANCOREADY) IVPB 1250 mg/250 mL        1,250 mg 166.7 mL/hr over 90 Minutes Intravenous  Once 02/18/24 1036 02/18/24 1502      Subjective: Today, patient was seen and examined at bedside.  Patient states that she feels better and wishes to eat more.  Has not had nausea  vomiting or abdominal pain since yesterday.  Was able to tolerate clears.  Feels hungry.  Objective: Vitals:   02/19/24 0522 02/19/24 0759  BP: (!) 155/81 (!) 161/73  Pulse: 62 (!) 102  Resp:  19  Temp: 98 F (36.7 C)   SpO2: 100% (!) 76%   No intake or output data in the 24 hours ending 02/19/24 1107 There were no vitals filed for this visit. There is no height or weight on file to calculate BMI.   Physical Exam:  GENERAL: Patient is alert awake and oriented. Not in obvious distress.  Occasionally fidgety.  Edentulous HENT: No scleral pallor or icterus. Pupils equally reactive to light. Oral mucosa is moist NECK: is supple, no gross swelling noted. CHEST: Clear to auscultation. No crackles or wheezes.  Diminished breath sounds bilaterally. CVS: S1 and S2 heard, no murmur. Regular rate and rhythm.  ABDOMEN: Soft, non-tender, bowel sounds are present. EXTREMITIES: No edema.  Transmetatarsal amputation of the left foot.  Right upper extremity fistula in place. CNS: Cranial nerves are intact. No focal motor deficits. SKIN: warm and  dry without rashes.  Data Review: I have personally reviewed the following laboratory data and studies,  CBC: Recent Labs  Lab 02/18/24 0845 02/19/24 0904  WBC 5.9 6.7  NEUTROABS 4.4  --   HGB 11.8* 11.9*  HCT 35.9* 36.5  MCV 95.7 96.6  PLT 403* 395   Basic Metabolic Panel: Recent Labs  Lab 02/18/24 0845 02/19/24 0904  NA 134* 136  K 2.7* 2.9*  CL 92* 95*  CO2 27 24  GLUCOSE 144* 107*  BUN 11 17  CREATININE 2.61* 2.99*  CALCIUM  9.1 9.3  MG 1.8  --    Liver Function Tests: Recent Labs  Lab 02/18/24 0845  AST 24  ALT 19  ALKPHOS 110  BILITOT 0.6  PROT 7.9  ALBUMIN 3.6   Recent Labs  Lab 02/18/24 0845  LIPASE 100*   No results for input(s): "AMMONIA" in the last 168 hours. Cardiac Enzymes: No results for input(s): "CKTOTAL", "CKMB", "CKMBINDEX", "TROPONINI" in the last 168 hours. BNP (last 3 results) Recent Labs     01/24/24 1222  BNP 603.4*    ProBNP (last 3 results) No results for input(s): "PROBNP" in the last 8760 hours.  CBG: No results for input(s): "GLUCAP" in the last 168 hours. Recent Results (from the past 240 hours)  Blood culture (routine x 2)     Status: None (Preliminary result)   Collection Time: 02/18/24 10:23 AM   Specimen: BLOOD LEFT HAND  Result Value Ref Range Status   Specimen Description BLOOD LEFT HAND  Final   Special Requests   Final    AEROBIC BOTTLE ONLY Blood Culture results may not be optimal due to an inadequate volume of blood received in culture bottles   Culture   Final    NO GROWTH < 24 HOURS Performed at [facility], [address] [place], [place] [address]    Report Status PENDING  Incomplete  Blood culture (routine x 2)     Status: None (Preliminary result)   Collection Time: 02/18/24 10:28 AM   Specimen: BLOOD LEFT HAND  Result Value Ref Range Status   Specimen Description BLOOD LEFT HAND  Final   Special Requests   Final    AEROBIC BOTTLE ONLY Blood Culture results may not be optimal due to an inadequate volume of blood received in culture bottles   Culture   Final    NO GROWTH < 24 HOURS Performed at [facility], [address], [place], [place] [address]    Report Status PENDING  Incomplete     Studies: US  Abdomen Limited RUQ (LIVER/GB) Result Date: 02/18/2024 CLINICAL DATA:  Upper abdominal pain and vomiting. EXAM: ULTRASOUND ABDOMEN LIMITED RIGHT UPPER QUADRANT COMPARISON:  CT scan 03/16/2023 FINDINGS: Gallbladder: The gallbladder is surgically absent. Common bile duct: Diameter: 5.7 mm Liver: No hepatic lesions or intrahepatic biliary dilatation. Overall normal echogenicity. Portal vein is patent on color Doppler imaging with normal direction of blood flow towards the liver. Other: No perihepatic right upper quadrant fluid collections. IMPRESSION: 1. Status post cholecystectomy. 2. No biliary dilatation. 3. Normal sonographic  appearance of the liver. Electronically Signed   By: Marrian Siva M.D.   On: 02/18/2024 17:38   MR FOOT LEFT WO CONTRAST Result Date: 02/18/2024 CLINICAL DATA:  Foot pain and swelling. Diabetic with history of prior amputations. EXAM: MRI OF THE LEFT FOOT WITHOUT CONTRAST TECHNIQUE: Multiplanar, multisequence MR imaging of the left foot was performed. No intravenous contrast was administered. COMPARISON:  Radiographs  02/18/2024 FINDINGS: Abnormal T1, T2 and STIR signal intensity most notably in the navicular bone. Findings highly suspicious for osteomyelitis. Similar but less significant changes in the proximal aspect of the medial cuneiform, the middle cuneiform and the lateral cuneiform. There is also some involvement of the medial aspect of the cuboid. Associated intertarsal joint effusions are worrisome. This could be reactive but not exclude septic arthritis. I do not see any definite destructive bony changes or chondral defects. Open wound noted on the plantar aspect of the midfoot overlying the region the medial talus. Associated cellulitis and myofasciitis but no soft tissue abscess or obvious pyomyositis. IMPRESSION: 1. Findings highly suspicious for osteomyelitis involving the navicular bone, the proximal aspect of the medial cuneiform, the middle cuneiform and the lateral cuneiform. There is also some involvement of the medial aspect of the cuboid. 2. Associated intertarsal joint effusions are worrisome. This could be reactive but not exclude septic arthritis. 3. Open wound on the plantar aspect of the midfoot overlying the region the medial talus. Associated cellulitis and myofasciitis but no soft tissue abscess or obvious pyomyositis. Electronically Signed   By: Marrian Siva M.D.   On: 02/18/2024 17:35   CT CHEST ABDOMEN PELVIS WO CONTRAST Result Date: 02/18/2024 CLINICAL DATA:  Unintentional weight loss EXAM: CT CHEST, ABDOMEN AND PELVIS WITHOUT CONTRAST TECHNIQUE: Multidetector CT imaging of  the chest, abdomen and pelvis was performed following the standard protocol without IV contrast. RADIATION DOSE REDUCTION: This exam was performed according to the departmental dose-optimization program which includes automated exposure control, adjustment of the mA and/or kV according to patient size and/or use of iterative reconstruction technique. COMPARISON:  CT a chest 01/24/2024. Chest abdomen and pelvic CTs of 03/16/2023 from [facility] FINDINGS: CT CHEST FINDINGS Cardiovascular: Aortic atherosclerosis. 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 Normal heart size, without pericardial effusion. Lad and left circumflex coronary artery calcification. Mediastinum/Nodes: No mediastinal or hilar adenopathy, given limitations of unenhanced CT. Lungs/Pleura: No pleural fluid.  Mild bibasilar scarring. Musculoskeletal: Included within the abdomen pelvic section. CT ABDOMEN PELVIS FINDINGS Hepatobiliary: Normal liver. Cholecystectomy, without biliary ductal dilatation. Pancreas: Normal, without mass or ductal dilatation. Spleen: Normal in size, without focal abnormality. Adrenals/Urinary Tract: Normal adrenal glands. Mild renal cortical thinning bilaterally. Punctate interpolar left renal collecting system calculi. No hydroureter or ureteric calculi. Low-density bilateral renal lesions of maximally 1.4 cm are likely cysts and do not warrant follow-up imaging. No bladder calculi. Stomach/Bowel: Normal stomach, without wall thickening. Normal colon and terminal ileum. Appendix not visualized. Normal small bowel. Vascular/Lymphatic: Aortic atherosclerosis. No abdominopelvic adenopathy. Reproductive: Normal uterus and adnexa. Other: No significant free fluid.  No free intraperitoneal air. Musculoskeletal: Mild T12 superior endplate compression deformity is unchanged. No ventral canal encroachment. IMPRESSION: 1. Low sensitivity exam secondary to lack of oral or IV contrast. 2. Given this  limitation, no explanation for weight loss. 3. Age advanced coronary artery atherosclerosis. Recommend assessment of coronary risk factors. 4.  Aortic Atherosclerosis (ICD10-I70.0). 5. Nonobstructive left nephrolithiasis. Electronically Signed   By: Lore Rode M.D.   On: 02/18/2024 14:48   DG Foot 2 Views Left Result Date: 02/18/2024 CLINICAL DATA:  Nonhealing ulcer.  Pain. EXAM: LEFT FOOT - 2 VIEW COMPARISON:  03/23/2024. FINDINGS: Plate and screw fixation of the distal fibula with 2 cannulated screws fixating the medial malleolus as before. 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 Soft tissue ulceration is identified along the medial plantar surface of the foot underlying the navicular bone and medial  cuneiform. There is progressive bony erosion along the posteromedial aspect of the medial cuneiform and anterolateral aspect of the navicular bone. No signs of acute fracture. IMPRESSION: 1. Soft tissue ulceration along the medial plantar surface of the foot adjacent to the navicular bone and medial cuneiform. 2. Progressive bony erosion along the posteromedial aspect of the medial cuneiform and anterolateral aspect of the navicular bone compatible with osteomyelitis. 3. 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 Electronically Signed   By: Kimberley Penman M.D.   On: 02/18/2024 09:15      Rosena Conradi, MD  [facility] 02/19/2024  If 7PM-7AM, please contact night-coverage

## 2024-02-19 NOTE — Plan of Care (Addendum)
 Pt refused 0500 labs. Provider notified. Problem: Education: Goal: Knowledge of General Education information will improve Description: Including pain rating scale, medication(s)/side effects and non-pharmacologic comfort measures Outcome: Progressing   Problem: Health Behavior/Discharge Planning: Goal: Ability to manage health-related needs will improve Outcome: Progressing   Problem: Clinical Measurements: Goal: Ability to maintain clinical measurements within normal limits will improve Outcome: Progressing Goal: Will remain free from infection Outcome: Progressing Goal: Diagnostic test results will improve Outcome: Progressing Goal: Respiratory complications will improve Outcome: Progressing Goal: Cardiovascular complication will be avoided Outcome: Progressing   Problem: Activity: Goal: Risk for activity intolerance will decrease Outcome: Progressing   Problem: Nutrition: Goal: Adequate nutrition will be maintained Outcome: Progressing   Problem: Coping: Goal: Level of anxiety will decrease Outcome: Progressing   Problem: Elimination: Goal: Will not experience complications related to bowel motility Outcome: Progressing Goal: Will not experience complications related to urinary retention Outcome: Progressing   Problem: Pain Managment: Goal: General experience of comfort will improve and/or be controlled Outcome: Progressing   Problem: Safety: Goal: Ability to remain free from injury will improve Outcome: Progressing   Problem: Skin Integrity: Goal: Risk for impaired skin integrity will decrease Outcome: Progressing

## 2024-02-19 NOTE — Plan of Care (Signed)

## 2024-02-20 DIAGNOSIS — R112 Nausea with vomiting, unspecified: Secondary | ICD-10-CM | POA: Diagnosis not present

## 2024-02-20 LAB — BASIC METABOLIC PANEL WITH GFR
Anion gap: 10 (ref 5–15)
BUN: 9 mg/dL (ref 6–20)
CO2: 28 mmol/L (ref 22–32)
Calcium: 9.3 mg/dL (ref 8.9–10.3)
Chloride: 96 mmol/L — ABNORMAL LOW (ref 98–111)
Creatinine, Ser: 2.13 mg/dL — ABNORMAL HIGH (ref 0.44–1.00)
GFR, Estimated: 28 mL/min — ABNORMAL LOW (ref >60)
Glucose, Bld: 135 mg/dL — ABNORMAL HIGH (ref 70–99)
Potassium: 3.5 mmol/L (ref 3.5–5.1)
Sodium: 134 mmol/L — ABNORMAL LOW (ref 135–145)

## 2024-02-20 LAB — CBC
HCT: 33.4 % — ABNORMAL LOW (ref 36.0–46.0)
Hemoglobin: 11.2 g/dL — ABNORMAL LOW (ref 12.0–15.0)
MCH: 32 pg (ref 26.0–34.0)
MCHC: 33.5 g/dL (ref 30.0–36.0)
MCV: 95.4 fL (ref 80.0–100.0)
Platelets: 354 10*3/uL (ref 150–400)
RBC: 3.5 MIL/uL — ABNORMAL LOW (ref 3.87–5.11)
RDW: 13.6 % (ref 11.5–15.5)
WBC: 7.4 10*3/uL (ref 4.0–10.5)
nRBC: 0 % (ref 0.0–0.2)

## 2024-02-20 LAB — HEPARIN LEVEL (UNFRACTIONATED): Heparin Unfractionated: 0.37 [IU]/mL (ref 0.30–0.70)

## 2024-02-20 LAB — MAGNESIUM: Magnesium: 1.8 mg/dL (ref 1.7–2.4)

## 2024-02-20 MED ORDER — ENSURE ENLIVE PO LIQD: 237.0000 mL | Freq: Two times a day (BID) | ORAL | Status: DC

## 2024-02-20 NOTE — Progress Notes (Signed)
   02/20/24 0246  Vitals  Temp 98.5 F (36.9 C)  Temp Source Oral  BP (!) 146/67  MAP (mmHg) 86  BP Location Right Leg  BP Method Automatic  Patient Position (if appropriate) Lying  Pulse Rate 73  Pulse Rate Source Monitor  Resp 16  MEWS COLOR  MEWS Score Color Green  Oxygen Therapy  SpO2 100 %  O2 Device Room Air  MEWS Score  MEWS Temp 0  MEWS Systolic 0  MEWS Pulse 0  MEWS RR 0  MEWS LOC 0  MEWS Score 0    Pt came back from dialysis treatment.

## 2024-02-20 NOTE — Consult Note (Signed)
 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 female.  HPI: Patient presented to the emergency department with chronic left foot wound.  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  Advanced imaging recently demonstrated likely osteomyelitis within the hindfoot bones and continued open wound.  I was consulted to see the patient due to the complexity of her infection and the possible need for amputation surgery.  Patient complains of wound on the medial foot.  She does not have any systemic symptoms of illness.  She has been dealing with this wound for several months and is ready to proceed with definitive treatment.  Past Medical History:  Diagnosis Date   Bipolar disorder (HCC)    Chronic combined systolic and diastolic CHF (congestive heart failure) (HCC)    a. ECHO 04/23/14 LV EF: 45% - 50%,  mild LVH, diffuse hypokinesis. LV diastolic dysfunction, high ventricular filling pressure. Mild LA dilation.   Cocaine abuse (HCC)    COPD (chronic obstructive pulmonary disease) (HCC)    Diabetes mellitus without complication (HCC)    Dyspnea    ESRD on hemodialysis (HCC)    GERD (gastroesophageal reflux disease)    Heroin overdose (HCC) 10/2018   Hypertension    Intractable nausea and vomiting 02/18/2024   MRSA infection    arm wound   Nicotine  dependence with current use    Seizures (HCC)    last seizure March 2021; usual 1-2 per month    Past Surgical History:  Procedure Laterality Date   ABDOMINAL HYSTERECTOMY     ACHILLES TENDON SURGERY Left 03/31/2020   Procedure: ACHILLES LENGTHENING;  Surgeon: Pink Bridges, DPM;  Location: [facility] ORS;  Service: Podiatry;  Laterality: Left;   AMPUTATION Right 10/04/2020    Procedure: PARTIAL AMPUTATION RAY 4th and 5th;  Surgeon: Pink Bridges, DPM;  Location: [facility];  Service: Podiatry;  Laterality: Right;   AMPUTATION TOE Left 03/23/2019   Procedure: AMPUTATION TOE GREAT LEFT TOE;  Surgeon: Sharlyn Deaner, DPM;  Location: [facility];  Service: Podiatry;  Laterality: Left;   AMPUTATION TOE Left 08/16/2019   Procedure: AMPUTATION TOE 5th;  Surgeon: Pink Bridges, DPM;  Location: [facility];  Service: Podiatry;  Laterality: Left;   AMPUTATION TOE Left 02/05/2020   Procedure: AMPUTATION MPJ LEFT 4TH;  Surgeon: Pink Bridges, DPM;  Location: [facility];  Service: Podiatry;  Laterality: Left;   ANKLE FRACTURE SURGERY Left    pins/screws in place   BUBBLE STUDY  01/08/2021   Procedure: BUBBLE STUDY;  Surgeon: Lenise Quince, MD;  Location: [facility] ENDOSCOPY;  Service: Cardiovascular;;   CHOLECYSTECTOMY  2005   FRACTURE SURGERY     IRRIGATION AND DEBRIDEMENT FOOT Left 10/04/2020   Procedure: IRRIGATION AND DEBRIDEMENT FOOT;  Surgeon: Pink Bridges, DPM;  Location: [facility];  Service: Podiatry;  Laterality: Left;   LOWER EXTREMITY ANGIOGRAPHY Left 03/12/2019   Procedure: LOWER EXTREMITY ANGIOGRAPHY;  Surgeon: Jackquelyn Mass, MD;  Location: [facility] INVASIVE CV LAB;  Service: Cardiovascular;  Laterality: Left;   LOWER EXTREMITY ANGIOGRAPHY Right 10/06/2020   Procedure: Lower Extremity Angiography;  Surgeon: Jackquelyn Mass, MD;  Location: [facility] INVASIVE CV LAB;  Service: Cardiovascular;  Laterality: Right;   TEE WITHOUT CARDIOVERSION N/A 03/26/2019   Procedure: TRANSESOPHAGEAL ECHOCARDIOGRAM (TEE);  Surgeon: End,  Veryl Gottron, MD;  Location: [facility];  Service: Cardiovascular;  Laterality: N/A;   TEE WITHOUT CARDIOVERSION N/A 01/08/2021   Procedure: TRANSESOPHAGEAL ECHOCARDIOGRAM (TEE);  Surgeon: Lenise Quince, MD;  Location: [facility] ENDOSCOPY;  Service: Cardiovascular;  Laterality: N/A;   TRANSMETATARSAL AMPUTATION Left 03/31/2020   Procedure: TRANSMETATARSAL AMPUTATION;  Surgeon:  Pink Bridges, DPM;  Location: [facility] OR[facility];  Service: Podiatry;  Laterality: Left;   TUBAL LIGATION      Family History  Problem Relation Age of Onset   Hypertension Other    Drug abuse Mother    Seizures Neg Hx     Social History:  reports that she has been smoking cigarettes. She has a 28 pack-year smoking history. She has never used smokeless tobacco. She reports that she does not currently use alcohol. She reports that she does not currently use drugs.  Allergies:  Allergies  Allergen Reactions   Albumin (Human)     unknown   Erythromycin Rash   Midecamycin Rash    Medications: I have reviewed the patient's current medications.  Results for orders placed or performed during the hospital encounter of 02/18/24 (from the past 48 hours)  Blood culture (routine x 2)     Status: None (Preliminary result)   Collection Time: 02/18/24 10:23 AM   Specimen: BLOOD LEFT HAND  Result Value Ref Range   Specimen Description BLOOD LEFT HAND    Special Requests      AEROBIC BOTTLE ONLY Blood Culture results may not be optimal due to an inadequate volume of blood received in culture bottles   Culture      NO GROWTH 2 DAYS Performed at [facility], [address], [place], [place] [address]    Report Status PENDING   Blood culture (routine x 2)     Status: None (Preliminary result)   Collection Time: 02/18/24 10:28 AM   Specimen: BLOOD LEFT HAND  Result Value Ref Range   Specimen Description BLOOD LEFT HAND    Special Requests      AEROBIC BOTTLE ONLY Blood Culture results may not be optimal due to an inadequate volume of blood received in culture bottles   Culture      NO GROWTH 2 DAYS Performed at [facility], [address], [place], [place] [address]    Report Status PENDING   Troponin I (High Sensitivity)     Status: Abnormal   Collection Time: 02/18/24 10:49 AM  Result Value Ref Range   Troponin I (High Sensitivity) 20 (H) <18 ng/L    Comment: (NOTE) Elevated  high sensitivity troponin I (hsTnI) values and significant  changes across serial measurements may suggest ACS but many other  chronic and acute conditions are known to elevate hsTnI results.  Refer to the "Links" section for chest pain algorithms and additional  guidance. Performed at [facility], [address], [place], [place] [address]   HIV Antibody (routine testing w rflx)     Status: None   Collection Time: 02/18/24  4:03 PM  Result Value Ref Range   HIV Screen 4th Generation wRfx Non Reactive Non Reactive    Comment: Performed at [facility], [address], [place], [place] [address]  Sedimentation rate     Status: Abnormal   Collection Time: 02/18/24  4:03 PM  Result Value Ref Range   Sed Rate 45 (H) 0 - 22 mm/hr    Comment: Performed at [facility], [address] [place], [place] [address]  C-reactive protein     Status: None   Collection Time: 02/18/24  4:03 PM  Result Value Ref Range   CRP <0.5 <1.0 mg/dL    Comment: Performed at [facility] Lab, [address] [place], [place] [address]  Heparin  level (unfractionated)     Status: Abnormal   Collection Time: 02/18/24  4:03 PM  Result Value Ref Range   Heparin  Unfractionated <0.10 (L) 0.30 - 0.70 IU/mL    Comment: (NOTE) The clinical reportable range upper limit is being lowered to >1.10 to align with the FDA approved guidance for the current laboratory assay.  If heparin  results are below expected values, and patient dosage has  been confirmed, suggest follow up testing of antithrombin III levels. Performed at [facility] Lab, [address] [place], [place] [address]   APTT     Status: None   Collection Time: 02/18/24  4:03 PM  Result Value Ref Range   aPTT 29 24 - 36 seconds    Comment: Performed at [facility] Lab, [address], [place], [place] [address]  Hemoglobin A1c     Status: None   Collection Time: 02/18/24  4:03 PM  Result Value Ref Range   Hgb A1c MFr Bld 5.4  4.8 - 5.6 %    Comment: (NOTE) Pre diabetes:          5.7%-6.4%  Diabetes:              >6.4%  Glycemic control for   <7.0% adults with diabetes    Mean Plasma Glucose 108.28 mg/dL    Comment: Performed at [facility] Lab, [address], [place], [place] [address]  Heparin  level (unfractionated)     Status: Abnormal   Collection Time: 02/18/24 11:37 PM  Result Value Ref Range   Heparin  Unfractionated 0.20 (L) 0.30 - 0.70 IU/mL    Comment: (NOTE) The clinical reportable range upper limit is being lowered to >1.10 to align with the FDA approved guidance for the current laboratory assay.  If heparin  results are below expected values, and patient dosage has  been confirmed, suggest follow up testing of antithrombin III levels. Performed at [facility] Lab, [address], [place], [place] [address]   APTT     Status: Abnormal   Collection Time: 02/18/24 11:37 PM  Result Value Ref Range   aPTT 42 (H) 24 - 36 seconds    Comment:        IF BASELINE aPTT IS ELEVATED, SUGGEST PATIENT RISK ASSESSMENT BE USED TO DETERMINE APPROPRIATE ANTICOAGULANT THERAPY. Performed at [facility] Lab, [address] [place], [place] [address]   CBC     Status: Abnormal   Collection Time: 02/19/24  9:04 AM  Result Value Ref Range   WBC 6.7 4.0 - 10.5 K/uL   RBC 3.78 (L) 3.87 - 5.11 MIL/uL   Hemoglobin 11.9 (L) 12.0 - 15.0 g/dL   HCT 53.6 64.4 - 03.4 %   MCV 96.6 80.0 - 100.0 fL   MCH 31.5 26.0 - 34.0 pg   MCHC 32.6 30.0 - 36.0 g/dL   RDW 74.2 59.5 - 63.8 %   Platelets 395 150 - 400 K/uL   nRBC 0.0 0.0 - 0.2 %    Comment: Performed at [facility] Lab, [address], [place], [place] [address]  Basic metabolic panel     Status: Abnormal   Collection Time: 02/19/24  9:04 AM  Result Value Ref Range   Sodium 136 135 - 145 mmol/L   Potassium 2.9 (L) 3.5 - 5.1 mmol/L   Chloride 95 (L) 98 - 111 mmol/L   CO2 24 22 - 32 mmol/L   Glucose, Bld 107 (H) 70 - 99 mg/dL    Comment:  Glucose reference range applies only to samples taken after fasting for at least 8 hours.   BUN 17 6 - 20 mg/dL   Creatinine, Ser 4.09 (H) 0.44 - 1.00 mg/dL   Calcium  9.3 8.9 - 10.3 mg/dL   GFR, Estimated 19 (L) >60 mL/min    Comment: (NOTE) Calculated using the CKD-EPI Creatinine Equation (2021)    Anion gap 17 (H) 5 - 15    Comment: Performed at [facility] Lab, [address] [place], [place] [address]  Heparin  level (unfractionated)     Status: None   Collection Time: 02/19/24  9:04 AM  Result Value Ref Range   Heparin  Unfractionated 0.36 0.30 - 0.70 IU/mL    Comment: (NOTE) The clinical reportable range upper limit is being lowered to >1.10 to align with the FDA approved guidance for the current laboratory assay.  If heparin  results are below expected values, and patient dosage has  been confirmed, suggest follow up testing of antithrombin III levels. Performed at [facility] Lab, [address] [place], [place] [address]   Hepatitis B surface antigen     Status: None   Collection Time: 02/19/24  6:25 PM  Result Value Ref Range   Hepatitis B Surface Ag NON REACTIVE NON REACTIVE    Comment: Performed at [facility] Lab, [address] [place], [place] [address]  Heparin  level (unfractionated)     Status: None   Collection Time: 02/20/24  5:59 AM  Result Value Ref Range   Heparin  Unfractionated 0.37 0.30 - 0.70 IU/mL    Comment: (NOTE) The clinical reportable range upper limit is being lowered to >1.10 to align with the FDA approved guidance for the current laboratory assay.  If heparin  results are below expected values, and patient dosage has  been confirmed, suggest follow up testing of antithrombin III levels. Performed at [facility], [address], [place], [place] [address]   CBC     Status: Abnormal   Collection Time: 02/20/24  5:59 AM  Result Value Ref Range   WBC 7.4 4.0 - 10.5 K/uL   RBC 3.50 (L) 3.87 - 5.11 MIL/uL   Hemoglobin 11.2 (L)  12.0 - 15.0 g/dL   HCT 84.6 (L) 96.2 - 95.2 %   MCV 95.4 80.0 - 100.0 fL   MCH 32.0 26.0 - 34.0 pg   MCHC 33.5 30.0 - 36.0 g/dL   RDW 84.1 32.4 - 40.1 %   Platelets 354 150 - 400 K/uL   nRBC 0.0 0.0 - 0.2 %    Comment: Performed at [facility] Lab, [address] [place], [place] [address]  Basic metabolic panel with GFR     Status: Abnormal   Collection Time: 02/20/24  5:59 AM  Result Value Ref Range   Sodium 134 (L) 135 - 145 mmol/L   Potassium 3.5 3.5 - 5.1 mmol/L   Chloride 96 (L) 98 - 111 mmol/L   CO2 28 22 - 32 mmol/L   Glucose, Bld 135 (H) 70 - 99 mg/dL    Comment: Glucose reference range applies only to samples taken after fasting for at least 8 hours.   BUN 9  6 - 20 mg/dL   Creatinine, Ser 1.61 (H) 0.44 - 1.00 mg/dL   Calcium  9.3 8.9 - 10.3 mg/dL   GFR, Estimated 28 (L) >60 mL/min    Comment: (NOTE) Calculated using the CKD-EPI Creatinine Equation (2021)    Anion gap 10 5 - 15    Comment: Performed at [facility] Lab, [address], [place], [place] [address]  Magnesium      Status: None   Collection Time: 02/20/24  5:59 AM  Result Value Ref Range   Magnesium  1.8 1.7 - 2.4 mg/dL    Comment: Performed at [facility] Lab, [address] [place], [place] [address]    US  Abdomen Limited RUQ (LIVER/GB) Result Date: 02/18/2024 CLINICAL DATA:  Upper abdominal pain and vomiting. EXAM: ULTRASOUND ABDOMEN LIMITED RIGHT UPPER QUADRANT COMPARISON:  CT scan 03/16/2023 FINDINGS: Gallbladder: The gallbladder is surgically absent. Common bile duct: Diameter: 5.7 mm Liver: No hepatic lesions or intrahepatic biliary dilatation. Overall normal echogenicity. Portal vein is patent on color Doppler imaging with normal direction of blood flow towards the liver. Other: No perihepatic right upper quadrant fluid collections. IMPRESSION: 1. Status post cholecystectomy. 2. No biliary dilatation. 3. Normal sonographic appearance of the liver. Electronically Signed   By: Marrian Siva M.D.    On: 02/18/2024 17:38   MR FOOT LEFT WO CONTRAST Result Date: 02/18/2024 CLINICAL DATA:  Foot pain and swelling. Diabetic with history of prior amputations. EXAM: MRI OF THE LEFT FOOT WITHOUT CONTRAST TECHNIQUE: Multiplanar, multisequence MR imaging of the left foot was performed. No intravenous contrast was administered. COMPARISON:  Radiographs 02/18/2024 FINDINGS: Abnormal T1, T2 and STIR signal intensity most notably in the navicular bone. Findings highly suspicious for osteomyelitis. Similar but less significant changes in the proximal aspect of the medial cuneiform, the middle cuneiform and the lateral cuneiform. There is also some involvement of the medial aspect of the cuboid. Associated intertarsal joint effusions are worrisome. This could be reactive but not exclude septic arthritis. I do not see any definite destructive bony changes or chondral defects. Open wound noted on the plantar aspect of the midfoot overlying the region the medial talus. Associated cellulitis and myofasciitis but no soft tissue abscess or obvious pyomyositis. IMPRESSION: 1. Findings highly suspicious for osteomyelitis involving the navicular bone, the proximal aspect of the medial cuneiform, the middle cuneiform and the lateral cuneiform. There is also some involvement of the medial aspect of the cuboid. 2. Associated intertarsal joint effusions are worrisome. This could be reactive but not exclude septic arthritis. 3. Open wound on the plantar aspect of the midfoot overlying the region the medial talus. Associated cellulitis and myofasciitis but no soft tissue abscess or obvious pyomyositis. Electronically Signed   By: Marrian Siva M.D.   On: 02/18/2024 17:35   CT CHEST ABDOMEN PELVIS WO CONTRAST Result Date: 02/18/2024 CLINICAL DATA:  Unintentional weight loss EXAM: CT CHEST, ABDOMEN AND PELVIS WITHOUT CONTRAST TECHNIQUE: Multidetector CT imaging of the chest, abdomen and pelvis was performed following the standard protocol  without IV contrast. RADIATION DOSE REDUCTION: This exam was performed according to the departmental dose-optimization program which includes automated exposure control, adjustment of the mA and/or kV according to patient size and/or use of iterative reconstruction technique. COMPARISON:  CT a chest 01/24/2024. Chest abdomen and pelvic CTs of 03/16/2023 from [facility] FINDINGS: CT CHEST FINDINGS Cardiovascular: Aortic atherosclerosis. 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 Normal heart size, without pericardial effusion. Lad and left circumflex coronary artery  calcification. Mediastinum/Nodes: No mediastinal or hilar adenopathy, given limitations of unenhanced CT. Lungs/Pleura: No pleural fluid.  Mild bibasilar scarring. Musculoskeletal: Included within the abdomen pelvic section. CT ABDOMEN PELVIS FINDINGS Hepatobiliary: Normal liver. Cholecystectomy, without biliary ductal dilatation. Pancreas: Normal, without mass or ductal dilatation. Spleen: Normal in size, without focal abnormality. Adrenals/Urinary Tract: Normal adrenal glands. Mild renal cortical thinning bilaterally. Punctate interpolar left renal collecting system calculi. No hydroureter or ureteric calculi. Low-density bilateral renal lesions of maximally 1.4 cm are likely cysts and do not warrant follow-up imaging. No bladder calculi. Stomach/Bowel: Normal stomach, without wall thickening. Normal colon and terminal ileum. Appendix not visualized. Normal small bowel. Vascular/Lymphatic: Aortic atherosclerosis. No abdominopelvic adenopathy. Reproductive: Normal uterus and adnexa. Other: No significant free fluid.  No free intraperitoneal air. Musculoskeletal: Mild T12 superior endplate compression deformity is unchanged. No ventral canal encroachment. IMPRESSION: 1. Low sensitivity exam secondary to lack of oral or IV contrast. 2. Given this limitation, no explanation for weight loss. 3. Age advanced coronary artery  atherosclerosis. Recommend assessment of coronary risk factors. 4.  Aortic Atherosclerosis (ICD10-I70.0). 5. Nonobstructive left nephrolithiasis. Electronically Signed   By: Lore Rode M.D.   On: 02/18/2024 14:48    Review of Systems  Constitutional: Negative.   HENT: Negative.    Respiratory: Negative.    Cardiovascular: Negative.   Gastrointestinal: Negative.   Musculoskeletal:        Chronic foot wound  Neurological:  Positive for numbness.  Psychiatric/Behavioral: Negative.    All other systems reviewed and are negative.  Blood pressure 128/66, pulse 65, temperature 97.7 F (36.5 C), resp. rate 16, SpO2 99%. Physical Exam HENT:     Head: Atraumatic.     Mouth/Throat:     Mouth: Mucous membranes are moist.  Eyes:     Extraocular Movements: Extraocular movements intact.  Cardiovascular:     Rate and Rhythm: Normal rate.  Pulmonary:     Effort: Pulmonary effort is normal.  Abdominal:     Palpations: Abdomen is soft.  Musculoskeletal:     Cervical back: Neck supple.     Comments: Right foot demonstrates evidence of prior transmetatarsal amputation.  There is a chronic appearing wound along the medial aspect of the foot.  She has significant calf atrophy noted.  No significant streaking erythema.  Some foul odor but no significant drainage noted.  Neurological:     Mental Status: She is alert. Mental status is at baseline.  Psychiatric:        Mood and Affect: Mood normal.     Assessment/Plan: Patient has a chronic left foot wound with underlying osteomyelitis that is indicated for below the knee amputation.  She is not systemically ill currently and there is no need for urgent or emergent surgery.  I discussed this with the patient and she would like to discharge from the hospital and schedule her surgery as an outpatient.  This is reasonable.  We will plan to have her follow-up with me in the office in the next week or so to schedule the surgery.  She states that she has  some social things she needs to take care of before surgery.  Again she is not systemically ill and is not in any urgent need of amputation.  She has been dealing with this wound for several months and it has not acutely worsened.  I will put my follow-up information in the chart and she can follow-up with me as an outpatient.  Donnamarie Gables 02/20/2024, 10:00 AM

## 2024-02-20 NOTE — Discharge Summary (Signed)
 Physician Discharge Summary  [name] JYN:[id number] DOB: [birth year] DOA: 02/18/2024  PCP: Darylene Epley, MD  Admit date: 02/18/2024 Discharge date: 02/20/2024  Admitted From: Home  Discharge disposition: Home   Recommendations for Outpatient Follow-Up:   Follow up with your primary care provider in one week.  Check CBC, BMP, magnesium  in the next visit Follow-up with  orthopedic surgery team at [facility] for definitive treatment of your foot ulcer. Recommend small frequent meals rather than large meals with low fat diet.    Discharge Diagnosis:   Principal Problem:   Intractable nausea and vomiting Active Problems:   Osteomyelitis of left foot (HCC)   Chronic ulcer of left foot (HCC)   Hypokalemia   History of DVT (deep vein thrombosis)   Diabetes mellitus type 2 with complications (HCC)   ESRD on hemodialysis (HCC)   Hyperlipidemia   PVD (peripheral vascular disease) (HCC)   Polysubstance abuse (HCC)   GERD (gastroesophageal reflux disease)    Discharge Condition: Improved.  Diet recommendation: Low sodium, heart healthy.  Carbohydrate-modified.  Low-fat diet.  Small portion meals  Wound care: None.  Continue dressing on the foot until seen by your surgical specialist.  Code status: Full.   History of Present Illness:   [name] is a 48 y.o. female with medical history significant of heart failure with preserved EF, type 2 diabetes on insulin , hypertension, COPD, ESRD Tuesday Thursday Saturday, seizure disorder, bipolar disorder, history of substance abuse, neuropathy, DVT of the left popliteal vein on anticoagulation presented hospital with nausea vomiting for 5 days with diffuse abdominal pain. Records note a previous hospitalization at [facility] in 02/2023 for nausea and vomiting with concern for hematemesis and anemia for which patient underwent a EGD that was normal without signs of bleeding.  She had just recently been diagnosed with a  DVT of the left leg in March and started on Eliquis , but last took the medication 5 days ago due to her symptoms.  Patient also reported nonhealing ulcer on the plantar aspect of her left foot and had 3 surgeries, being followed by wound care in the outpatient setting.  Patient reports that she was set up to see a surgeon on 5 5 to discuss about below-knee amputation and was sent on Augmentin  and doxycycline  last week.  She however has not been able to keep antibiotics down.  In the ED patient was afebrile.  Labs showed sodium of 134 with creatinine elevation at 2.6.  High-sensitivity troponin of 20.  X-ray of the left foot revealed soft tissue ulceration along the medial plantar surface of the foot with progressive bony erosion on the posterior medial aspect of the medial cuneiform and anterolateral aspect of the navicular bone compatible with osteomyelitis.  CT scan of the chest abdomen pelvis was unremarkable.  Patient was then considered for admission to the hospital for further evaluation and treatment  Hospital Course:   Following conditions were addressed during hospitalization as listed below,  Intractable nausea and vomiting-resolved CT scan of the abdomen and pelvis without any symptoms.  Question gastroparesis.  Phenergan  seems to help.  Encouraged small portion meals and low in fat fried foods.  EGD last year at [facility] on 02/2023 was normal.   Right upper quadrant ultrasound shows normal sonographic appearance of the liver with no biliary dilatation and status postcholecystectomy.  At this time patient has tolerated oral diet and has kept it down.    Chronic ulcer of the left foot with  osteomyelitis  Patient reports having an ulcer on the left foot present for multiple years reported having CT at [facility] that reported that the foot likely had osteomyelitis and was referred to a surgeon below-knee amputation.  MRI of the foot highly suspicious for osteomyelitis involving the navicular bone  proximal aspect of the medial cuneiform, middle cuneiform and lateral cuneiform.  Follow-up blood cultures.  ESR elevated at 45.  CRP less than 0.5.  Patient was on Augmentin  and doxycycline  as outpatient.  Orthopedics has been consulted and recommend BKA depending upon clinical improvement.  Patient is to follow-up with [facility] and has an appointment May 5th for potential amputation.  Blood cultures negative in 2 days.  Patient will continue oral antibiotics with Augmentin  and doxycycline  on discharge.   Hypokalemia Patient has a history of significantly low potassium of 2.7 on presentation due to nausea vomiting.  Has improved at this time.  Latest potassium of 3.5.  Magnesium  1.8.  History of DVT Recently diagnosed of DVT of the left leg on 12/2023 and was started on Eliquis .  Last dose of Eliquis  was approximately 5 days ago.  Will resume Eliquis  on discharge.   Diabetes mellitus type 2, without long-term use of insulin  On admission glucose was noted to be 144.  Last available hemoglobin A1c was 5.7 on 03/21/2023.  Currently no longer on insulin .  Repeat hemoglobin A1c of 5.4.    ESRD on HD On Tuesdays Thursdays Saturday schedule.  Nephrology saw the patient during hospitalization for hemodialysis and received hemodialysis prior to discharge..   Peripheral vascular disease Hyperlipidemia On Lipitor at home.  Will resume on discharge.   Bipolar disorder Patient reportedly had been prescribed Lexapro but has not started the medication yet.  Encouraged to take it.   Polysubstance abuse Patient reports that she has not used drugs or alcohol in the last year.  Reinforced quitting.  Chronic Systolic and Diastolic Heart Failure.  Compensated at this time.   GERD -Continue Protonix  from home   Disposition.  At this time, patient is stable for disposition home with outpatient PCP and orthopedic follow-up  Medical Consultants:   None.  Procedures:    None Subjective:    Today, patient was seen and examined at bedside.  Has tolerated oral diet.  Wants to go home.  Denies any nausea vomiting fever chills or rigor.  Discharge Exam:   Vitals:   02/20/24 0510 02/20/24 0752  BP: (!) 156/84 128/66  Pulse: 76 65  Resp: 16 16  Temp: 98.3 F (36.8 C) 97.7 F (36.5 C)  SpO2: 100% 99%   Vitals:   02/20/24 0150 02/20/24 0246 02/20/24 0510 02/20/24 0752  BP: (!) 145/71 (!) 146/67 (!) 156/84 128/66  Pulse: 83 73 76 65  Resp: 14 16 16 16   Temp: 98.8 F (37.1 C) 98.5 F (36.9 C) 98.3 F (36.8 C) 97.7 F (36.5 C)  TempSrc: Oral Oral Oral   SpO2: 90% 100% 100% 99%    General: Alert awake, not in obvious distress, thinly built, feels tearful and wants to go home HENT: pupils equally reacting to light,  No scleral pallor or icterus noted. Oral mucosa is moist.  Chest:  Clear breath sounds.   No crackles or wheezes.  CVS: S1 &S2 heard. No murmur.  Regular rate and rhythm. Abdomen: Soft, nontender, nondistended.  Bowel sounds are heard.   Extremities: No cyanosis, clubbing or edema.  Peripheral pulses are palpable.  Status post transmetatarsal amputation of the left foot, right  upper extremity fistula in place. Psych: Alert, awake and oriented, normal mood CNS:  No cranial nerve deficits moves all extremities. Skin: Warm and dry  The results of significant diagnostics from this hospitalization (including imaging, microbiology, ancillary and laboratory) are listed below for reference.     Diagnostic Studies:   US  Abdomen Limited RUQ (LIVER/GB) Result Date: 02/18/2024 CLINICAL DATA:  Upper abdominal pain and vomiting. EXAM: ULTRASOUND ABDOMEN LIMITED RIGHT UPPER QUADRANT COMPARISON:  CT scan 03/16/2023 FINDINGS: Gallbladder: The gallbladder is surgically absent. Common bile duct: Diameter: 5.7 mm Liver: No hepatic lesions or intrahepatic biliary dilatation. Overall normal echogenicity. Portal vein is patent on color Doppler imaging with normal direction of  blood flow towards the liver. Other: No perihepatic right upper quadrant fluid collections. IMPRESSION: 1. Status post cholecystectomy. 2. No biliary dilatation. 3. Normal sonographic appearance of the liver. Electronically Signed   By: Marrian Siva M.D.   On: 02/18/2024 17:38   MR FOOT LEFT WO CONTRAST Result Date: 02/18/2024 CLINICAL DATA:  Foot pain and swelling. Diabetic with history of prior amputations. EXAM: MRI OF THE LEFT FOOT WITHOUT CONTRAST TECHNIQUE: Multiplanar, multisequence MR imaging of the left foot was performed. No intravenous contrast was administered. COMPARISON:  Radiographs 02/18/2024 FINDINGS: Abnormal T1, T2 and STIR signal intensity most notably in the navicular bone. Findings highly suspicious for osteomyelitis. Similar but less significant changes in the proximal aspect of the medial cuneiform, the middle cuneiform and the lateral cuneiform. There is also some involvement of the medial aspect of the cuboid. Associated intertarsal joint effusions are worrisome. This could be reactive but not exclude septic arthritis. I do not see any definite destructive bony changes or chondral defects. Open wound noted on the plantar aspect of the midfoot overlying the region the medial talus. Associated cellulitis and myofasciitis but no soft tissue abscess or obvious pyomyositis. IMPRESSION: 1. Findings highly suspicious for osteomyelitis involving the navicular bone, the proximal aspect of the medial cuneiform, the middle cuneiform and the lateral cuneiform. There is also some involvement of the medial aspect of the cuboid. 2. Associated intertarsal joint effusions are worrisome. This could be reactive but not exclude septic arthritis. 3. Open wound on the plantar aspect of the midfoot overlying the region the medial talus. Associated cellulitis and myofasciitis but no soft tissue abscess or obvious pyomyositis. Electronically Signed   By: Marrian Siva M.D.   On: 02/18/2024 17:35   CT CHEST  ABDOMEN PELVIS WO CONTRAST Result Date: 02/18/2024 CLINICAL DATA:  Unintentional weight loss EXAM: CT CHEST, ABDOMEN AND PELVIS WITHOUT CONTRAST TECHNIQUE: Multidetector CT imaging of the chest, abdomen and pelvis was performed following the standard protocol without IV contrast. RADIATION DOSE REDUCTION: This exam was performed according to the departmental dose-optimization program which includes automated exposure control, adjustment of the mA and/or kV according to patient size and/or use of iterative reconstruction technique. COMPARISON:  CT a chest 01/24/2024. Chest abdomen and pelvic CTs of 03/16/2023 from [facility] FINDINGS: CT CHEST FINDINGS Cardiovascular: Aortic atherosclerosis. 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 Normal heart size, without pericardial effusion. Lad and left circumflex coronary artery calcification. Mediastinum/Nodes: No mediastinal or hilar adenopathy, given limitations of unenhanced CT. Lungs/Pleura: No pleural fluid.  Mild bibasilar scarring. Musculoskeletal: Included within the abdomen pelvic section. CT ABDOMEN PELVIS FINDINGS Hepatobiliary: Normal liver. Cholecystectomy, without biliary ductal dilatation. Pancreas: Normal, without mass or ductal dilatation. Spleen: Normal in size, without focal abnormality. Adrenals/Urinary Tract: Normal adrenal glands. Mild renal cortical thinning bilaterally. Punctate  interpolar left renal collecting system calculi. No hydroureter or ureteric calculi. Low-density bilateral renal lesions of maximally 1.4 cm are likely cysts and do not warrant follow-up imaging. No bladder calculi. Stomach/Bowel: Normal stomach, without wall thickening. Normal colon and terminal ileum. Appendix not visualized. Normal small bowel. Vascular/Lymphatic: Aortic atherosclerosis. No abdominopelvic adenopathy. Reproductive: Normal uterus and adnexa. Other: No significant free fluid.  No free intraperitoneal air. Musculoskeletal:  Mild T12 superior endplate compression deformity is unchanged. No ventral canal encroachment. IMPRESSION: 1. Low sensitivity exam secondary to lack of oral or IV contrast. 2. Given this limitation, no explanation for weight loss. 3. Age advanced coronary artery atherosclerosis. Recommend assessment of coronary risk factors. 4.  Aortic Atherosclerosis (ICD10-I70.0). 5. Nonobstructive left nephrolithiasis. Electronically Signed   By: Lore Rode M.D.   On: 02/18/2024 14:48   DG Foot 2 Views Left Result Date: 02/18/2024 CLINICAL DATA:  Nonhealing ulcer.  Pain. EXAM: LEFT FOOT - 2 VIEW COMPARISON:  03/23/2024. FINDINGS: Plate and screw fixation of the distal fibula with 2 cannulated screws fixating the medial malleolus as before. 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 Soft tissue ulceration is identified along the medial plantar surface of the foot underlying the navicular bone and medial cuneiform. There is progressive bony erosion along the posteromedial aspect of the medial cuneiform and anterolateral aspect of the navicular bone. No signs of acute fracture. IMPRESSION: 1. Soft tissue ulceration along the medial plantar surface of the foot adjacent to the navicular bone and medial cuneiform. 2. Progressive bony erosion along the posteromedial aspect of the medial cuneiform and anterolateral aspect of the navicular bone compatible with osteomyelitis. 3. 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 Electronically Signed   By: Kimberley Penman M.D.   On: 02/18/2024 09:15     Labs:   Basic Metabolic Panel: Recent Labs  Lab 02/18/24 0845 02/19/24 0904 02/20/24 0559  NA 134* 136 134*  K 2.7* 2.9* 3.5  CL 92* 95* 96*  CO2 27 24 28   GLUCOSE 144* 107* 135*  BUN 11 17 9   CREATININE 2.61* 2.99* 2.13*  CALCIUM  9.1 9.3 9.3  MG 1.8  --  1.8   GFR CrCl cannot be calculated (Unknown ideal weight.). Liver Function Tests: Recent Labs  Lab 02/18/24 0845  AST 24  ALT  19  ALKPHOS 110  BILITOT 0.6  PROT 7.9  ALBUMIN 3.6   Recent Labs  Lab 02/18/24 0845  LIPASE 100*   No results for input(s): "AMMONIA" in the last 168 hours. Coagulation profile No results for input(s): "INR", "PROTIME" in the last 168 hours.  CBC: Recent Labs  Lab 02/18/24 0845 02/19/24 0904 02/20/24 0559  WBC 5.9 6.7 7.4  NEUTROABS 4.4  --   --   HGB 11.8* 11.9* 11.2*  HCT 35.9* 36.5 33.4*  MCV 95.7 96.6 95.4  PLT 403* 395 354   Cardiac Enzymes: No results for input(s): "CKTOTAL", "CKMB", "CKMBINDEX", "TROPONINI" in the last 168 hours. BNP: Invalid input(s): "POCBNP" CBG: No results for input(s): "GLUCAP" in the last 168 hours. D-Dimer No results for input(s): "DDIMER" in the last 72 hours. Hgb A1c Recent Labs    02/18/24 1603  HGBA1C 5.4   Lipid Profile No results for input(s): "CHOL", "HDL", "LDLCALC", "TRIG", "CHOLHDL", "LDLDIRECT" in the last 72 hours. Thyroid function studies No results for input(s): "TSH", "T4TOTAL", "T3FREE", "THYROIDAB" in the last 72 hours.  Invalid input(s): "FREET3" Anemia work up No results for input(s): "VITAMINB12", "FOLATE", "FERRITIN", "TIBC", "IRON", "RETICCTPCT" in the last 72  hours. Microbiology Recent Results (from the past 240 hours)  Blood culture (routine x 2)     Status: None (Preliminary result)   Collection Time: 02/18/24 10:23 AM   Specimen: BLOOD LEFT HAND  Result Value Ref Range Status   Specimen Description BLOOD LEFT HAND  Final   Special Requests   Final    AEROBIC BOTTLE ONLY Blood Culture results may not be optimal due to an inadequate volume of blood received in culture bottles   Culture   Final    NO GROWTH 2 DAYS Performed at [facility], [address] [place], [place] [address]    Report Status PENDING  Incomplete  Blood culture (routine x 2)     Status: None (Preliminary result)   Collection Time: 02/18/24 10:28 AM   Specimen: BLOOD LEFT HAND  Result Value Ref Range Status   Specimen  Description BLOOD LEFT HAND  Final   Special Requests   Final    AEROBIC BOTTLE ONLY Blood Culture results may not be optimal due to an inadequate volume of blood received in culture bottles   Culture   Final    NO GROWTH 2 DAYS Performed at [facility], [address], [place], [place] [address]    Report Status PENDING  Incomplete     Discharge Instructions:   Discharge Instructions     Call MD for:  persistant nausea and vomiting   Complete by: As directed    Call MD for:  severe uncontrolled pain   Complete by: As directed    Call MD for:  temperature >100.4   Complete by: As directed    Diet - low sodium heart healthy   Complete by: As directed    Discharge instructions   Complete by: As directed    Follow-up with your primary care provider in 1 week.  Check blood work at that time.  Continue antibiotics from home.  Follow-up with your orthopedic surgeon for definitive foot surgery as scheduled.  Seek medical attention for worsening symptoms.  Please consider small frequent meals and avoid fatty fried and large portion meals.   Increase activity slowly   Complete by: As directed    No wound care   Complete by: As directed       Allergies as of 02/20/2024       Reactions   Albumin (human)    unknown   Erythromycin Rash   Midecamycin Rash        Medication List     TAKE these medications    acetaminophen  325 MG tablet Commonly known as: TYLENOL  Take 2 tablets (650 mg total) by mouth every 6 (six) hours as needed for mild pain (or Fever >/= 101).   amoxicillin -clavulanate 500-125 MG tablet Commonly known as: AUGMENTIN  Take 1 tablet by mouth. Unknown frequency.   Apixaban  Starter Pack (10mg  and 5mg ) Commonly known as: ELIQUIS  STARTER PACK Take as directed on package: start with two-5mg  tablets twice daily for 7 days. On day 8, switch to one-5mg  tablet twice daily.   atorvastatin  40 MG tablet Commonly known as: LIPITOR Take 40 mg by mouth daily.    azelastine 0.1 % nasal spray Commonly known as: ASTELIN Place 2 sprays into both nostrils 2 (two) times daily. Use 1-2 sprays in each nostril as directed   doxycycline  100 MG capsule Commonly known as: VIBRAMYCIN  Take 100 mg by mouth 2 (two) times daily. For 7 days.   escitalopram 10 MG tablet Commonly known  as: LEXAPRO Take 10 mg by mouth daily.   oxyCODONE -acetaminophen  5-325 MG tablet Commonly known as: Percocet Take 1 tablet by mouth every 6 (six) hours as needed for severe pain (pain score 7-10) or moderate pain (pain score 4-6).   pantoprazole  20 MG tablet Commonly known as: PROTONIX  Take 20 mg by mouth 2 (two) times daily.   promethazine  25 MG tablet Commonly known as: PHENERGAN  Take 25 mg by mouth every 6 (six) hours as needed for nausea or vomiting.        Follow-up Information     Darylene Epley, MD Follow up in 1 week(s).   Specialty: Internal Medicine Contact information: [address] [place] [address] [phone]                  Time coordinating discharge: 39 minutes  Signed:  Suyash Amory  [facility] 02/20/2024, 9:14 AM

## 2024-02-20 NOTE — Progress Notes (Signed)
   02/20/24 0150  Vitals  Temp 98.8 F (37.1 C)  Temp Source Oral  BP (!) 145/71  MAP (mmHg) 91  BP Location Right Arm  BP Method Automatic  Patient Position (if appropriate) Lying  Pulse Rate 83  Pulse Rate Source Monitor  ECG Heart Rate 84  Resp 14  Oxygen Therapy  SpO2 90 %  O2 Device Room Air  During Treatment Monitoring  Blood Flow Rate (mL/min) 0 mL/min  Arterial Pressure (mmHg) -1.82 mmHg  Venous Pressure (mmHg) 3.03 mmHg  TMP (mmHg) -44.24 mmHg  Ultrafiltration Rate (mL/min) 409 mL/min  Dialysate Flow Rate (mL/min) 299 ml/min  Dialysate Potassium Concentration 4  Dialysate Calcium  Concentration 2.25  Duration of HD Treatment -hour(s) 2.28 hour(s)  Cumulative Fluid Removed (mL) per Treatment  1306.95  HD Safety Checks Performed Yes  Intra-Hemodialysis Comments Tx completed  Post Treatment  Dialyzer Clearance Lightly streaked  Liters Processed 47.4  Fluid Removed (mL) 1300 mL  Tolerated HD Treatment Yes  Post-Hemodialysis Comments  (Pt request to end treatment)  AVG/AVF Arterial Site Held (minutes) 10 minutes  AVG/AVF Venous Site Held (minutes) 7 minutes  Fistula / Graft Right Upper arm  Placement Date/Time: (c) 02/18/24 (c) 1757   Placed prior to admission: Yes  Orientation: Right  Access Location: Upper arm  Site Condition No complications  Fistula / Graft Assessment Thrill;Bruit;Present  Status Deaccessed  Needle Size 15  Drainage Description None   Pt signed off 45 minutes early. AMA & Consent signed.

## 2024-02-20 NOTE — Progress Notes (Signed)
 ANTICOAGULATION CONSULT NOTE  Pharmacy Consult for Heparin  Indication: DVT  Allergies  Allergen Reactions   Albumin (Human)     unknown   Erythromycin Rash   Midecamycin Rash    Patient Measurements:   Heparin  Dosing Weight: 59 kg  Vital Signs: Temp: 97.7 F (36.5 C) (04/29 0752) Temp Source: Oral (04/29 0510) BP: 128/66 (04/29 0752) Pulse Rate: 65 (04/29 0752)  Labs: Recent Labs    02/18/24 0845 02/18/24 1049 02/18/24 1603 02/18/24 1603 02/18/24 2337 02/19/24 0904 02/20/24 0559  HGB 11.8*  --   --   --   --  11.9* 11.2*  HCT 35.9*  --   --   --   --  36.5 33.4*  PLT 403*  --   --   --   --  395 354  APTT  --   --  29  --  42*  --   --   HEPARINUNFRC  --   --  <0.10*   < > 0.20* 0.36 0.37  CREATININE 2.61*  --   --   --   --  2.99* 2.13*  TROPONINIHS 20* 20*  --   --   --   --   --    < > = values in this interval not displayed.    CrCl cannot be calculated (Unknown ideal weight.).   Assessment: 72 yof with a history of DVT started on eliquis  in March, HF, substance abuse, COPD, DM, GERD, HTN, seizures, bipolar disorder, ESRD on HD TuThSa. Patient is presenting with nausea, vomiting and left foot pain . Heparin  per pharmacy consult placed for DVT while holding eliquis .  Patient is on apixaban  prior to arrival. Last dose 02/13/24 per patient but med rec technician is stating concerns of reliability of history at this time. Will require aPTT monitoring due to likely falsely high anti-Xa level secondary to DOAC use.  AM: heparin  level therapeutic.   Hgb 11.9; plt 395  Goal of Therapy:  Heparin  level 0.3-0.7 units/ml aPTT 66-102 seconds Monitor platelets by anticoagulation protocol: Yes   Plan:  Continue heparin  infusion at 1200 units/hr Check anti-Xa level daily while on heparin  Continue to monitor H&H and platelets  Thank you., Lennice Quivers, PharmD 02/20/2024, 8:33 AM

## 2024-02-20 NOTE — Progress Notes (Addendum)
 D/C order noted. Contacted [facility] to advise clinic of pt's d/c today. Clinic reports pt is on a TTS schedule and pt's next scheduled treatment is Thursday. This info was provided to nephrologist. Will fax d/c summary and today's renal note to clinic once available for continuation of care.   Lauraine Polite Renal Navigator [phone]  Addendum 12:16 pm; D/C summary and renal consult note faxed to clinic for continuation of care.

## 2024-02-20 NOTE — Plan of Care (Signed)

## 2024-02-21 LAB — HEPATITIS B SURFACE ANTIBODY, QUANTITATIVE: Hep B S AB Quant (Post): 122 m[IU]/mL

## 2024-02-23 LAB — CULTURE, BLOOD (ROUTINE X 2)
Culture: NO GROWTH
Culture: NO GROWTH

## 2024-02-25 NOTE — H&P (View-Only) (Signed)
 MRN : [id number]  [name] is a 48 y.o. ([birth year]) female who presents with chief complaint of check circulation.  History of Present Illness:   The patient returns to the office for followup Patient has a chronic left foot wound with underlying osteomyelitis that is indicated for below the knee amputation. She is not systemically ill currently and there is no need for urgent or emergent surgery.    There have been no significant changes to the patient's overall health care.   The patient denies amaurosis fugax or recent TIA symptoms. There are no recent neurological changes noted. The patient denies history of DVT, PE or superficial thrombophlebitis. The patient denies recent episodes of angina or shortness of breath.    ABI's Rt=1.01 and Lt=1.01    No outpatient medications have been marked as taking for the 02/26/24 encounter (Appointment) with Prescilla Brod, Ninette Basque, MD.    Past Medical History:  Diagnosis Date   Bipolar disorder (HCC)    Chronic combined systolic and diastolic CHF (congestive heart failure) (HCC)    a. ECHO 04/23/14 LV EF: 45% - 50%,  mild LVH, diffuse hypokinesis. LV diastolic dysfunction, high ventricular filling pressure. Mild LA dilation.   Cocaine abuse (HCC)    COPD (chronic obstructive pulmonary disease) (HCC)    Diabetes mellitus without complication (HCC)    Dyspnea    ESRD on hemodialysis (HCC)    GERD (gastroesophageal reflux disease)    Heroin overdose (HCC) 10/2018   Hypertension    Intractable nausea and vomiting 02/18/2024   MRSA infection    arm wound   Nicotine  dependence with current use    Seizures (HCC)    last seizure March 2021; usual 1-2 per month    Past Surgical History:  Procedure Laterality Date   ABDOMINAL HYSTERECTOMY     ACHILLES TENDON SURGERY Left 03/31/2020   Procedure: ACHILLES LENGTHENING;  Surgeon: Pink Bridges, DPM;  Location: [facility];   Service: Podiatry;  Laterality: Left;   AMPUTATION Right 10/04/2020   Procedure: PARTIAL AMPUTATION RAY 4th and 5th;  Surgeon: Pink Bridges, DPM;  Location: [facility];  Service: Podiatry;  Laterality: Right;   AMPUTATION TOE Left 03/23/2019   Procedure: AMPUTATION TOE GREAT LEFT TOE;  Surgeon: Sharlyn Deaner, DPM;  Location: [facility];  Service: Podiatry;  Laterality: Left;   AMPUTATION TOE Left 08/16/2019   Procedure: AMPUTATION TOE 5th;  Surgeon: Pink Bridges, DPM;  Location: [facility];  Service: Podiatry;  Laterality: Left;   AMPUTATION TOE Left 02/05/2020   Procedure: AMPUTATION MPJ LEFT 4TH;  Surgeon: Pink Bridges, DPM;  Location: [facility];  Service: Podiatry;  Laterality: Left;   ANKLE FRACTURE SURGERY Left    pins/screws in place   BUBBLE STUDY  01/08/2021   Procedure: BUBBLE STUDY;  Surgeon: Lenise Quince, MD;  Location: [facility];  Service: Cardiovascular;;   CHOLECYSTECTOMY  2005   FRACTURE SURGERY     IRRIGATION AND DEBRIDEMENT FOOT Left 10/04/2020   Procedure: IRRIGATION AND DEBRIDEMENT FOOT;  Surgeon: Pink Bridges, DPM;  Location: [facility];  Service: Podiatry;  Laterality: Left;  LOWER EXTREMITY ANGIOGRAPHY Left 03/12/2019   Procedure: LOWER EXTREMITY ANGIOGRAPHY;  Surgeon: Jackquelyn Mass, MD;  Location: [facility];  Service: Cardiovascular;  Laterality: Left;   LOWER EXTREMITY ANGIOGRAPHY Right 10/06/2020   Procedure: Lower Extremity Angiography;  Surgeon: Jackquelyn Mass, MD;  Location: [facility];  Service: Cardiovascular;  Laterality: Right;   TEE WITHOUT CARDIOVERSION N/A 03/26/2019   Procedure: TRANSESOPHAGEAL ECHOCARDIOGRAM (TEE);  Surgeon: Sammy Crisp, MD;  Location: [facility];  Service: Cardiovascular;  Laterality: N/A;   TEE WITHOUT CARDIOVERSION N/A 01/08/2021   Procedure: TRANSESOPHAGEAL ECHOCARDIOGRAM (TEE);  Surgeon: Lenise Quince, MD;  Location: [facility] ENDOSCOPY;  Service: Cardiovascular;  Laterality: N/A;   TRANSMETATARSAL  AMPUTATION Left 03/31/2020   Procedure: TRANSMETATARSAL AMPUTATION;  Surgeon: Pink Bridges, DPM;  Location: [facility];  Service: Podiatry;  Laterality: Left;   TUBAL LIGATION      Social History Social History   Tobacco Use   Smoking status: Every Day    Current packs/day: 1.00    Average packs/day: 1 pack/day for 28.0 years (28.0 ttl pk-yrs)    Types: Cigarettes   Smokeless tobacco: Never  Vaping Use   Vaping status: Never Used  Substance Use Topics   Alcohol use: Not Currently    Alcohol/week: 0.0 standard drinks of alcohol    Comment: Quit drinking 11/16 per pt   Drug use: Not Currently    Comment: last use 5 years ago 2016    Family History Family History  Problem Relation Age of Onset   Hypertension Other    Drug abuse Mother    Seizures Neg Hx     Allergies  Allergen Reactions   Albumin (Human)     unknown   Erythromycin Rash   Midecamycin Rash     REVIEW OF SYSTEMS (Negative unless checked)  Constitutional: [] Weight loss  [] Fever  [] Chills Cardiac: [] Chest pain   [] Chest pressure   [] Palpitations   [] Shortness of breath when laying flat   [] Shortness of breath with exertion. Vascular:  [x] Pain in legs with walking   [] Pain in legs at rest  [] History of DVT   [] Phlebitis   [] Swelling in legs   [] Varicose veins   [] Non-healing ulcers Pulmonary:   [] Uses home oxygen   [] Productive cough   [] Hemoptysis   [] Wheeze  [] COPD   [] Asthma Neurologic:  [] Dizziness   [] Seizures   [] History of stroke   [] History of TIA  [] Aphasia   [] Vissual changes   [] Weakness or numbness in arm   [] Weakness or numbness in leg Musculoskeletal:   [] Joint swelling   [] Joint pain   [] Low back pain Hematologic:  [] Easy bruising  [] Easy bleeding   [] Hypercoagulable state   [] Anemic Gastrointestinal:  [] Diarrhea   [] Vomiting  [] Gastroesophageal reflux/heartburn   [] Difficulty swallowing. Genitourinary:  [] Chronic kidney disease   [] Difficult urination  [] Frequent urination   [] Blood in  urine Skin:  [] Rashes   [] Ulcers  Psychological:  [] History of anxiety   []  History of major depression.  Physical Examination  There were no vitals filed for this visit. There is no height or weight on file to calculate BMI. Gen: WD/WN, NAD Head: [place]/AT, No temporalis wasting.  Ear/Nose/Throat: Hearing grossly intact, nares w/o erythema or drainage Eyes: PER, EOMI, sclera nonicteric.  Neck: Supple, no masses.  No bruit or JVD.  Pulmonary:  Good air movement, no audible wheezing, no use of accessory muscles.  Cardiac: RRR, normal S1, S2, no Murmurs. Vascular:  mild trophic changes, left foot  open wounds Vessel Right Left  Radial Palpable Palpable  PT  Palpable Palpable  DP Palpable  Palpable  Gastrointestinal: soft, non-distended. No guarding/no peritoneal signs.  Musculoskeletal: M/S 5/5 throughout.  No visible deformity.  Neurologic: CN 2-12 intact. Pain and light touch intact in extremities.  Symmetrical.  Speech is fluent. Motor exam as listed above. Psychiatric: Judgment intact, Mood & affect appropriate for pt's clinical situation. Dermatologic: No rashes or ulcers noted.  No changes consistent with cellulitis.   CBC Lab Results  Component Value Date   WBC 7.4 02/20/2024   HGB 11.2 (L) 02/20/2024   HCT 33.4 (L) 02/20/2024   MCV 95.4 02/20/2024   PLT 354 02/20/2024    BMET    Component Value Date/Time   NA 134 (L) 02/20/2024 0559   K 3.5 02/20/2024 0559   CL 96 (L) 02/20/2024 0559   CO2 28 02/20/2024 0559   GLUCOSE 135 (H) 02/20/2024 0559   BUN 9 02/20/2024 0559   CREATININE 2.13 (H) 02/20/2024 0559   CREATININE 1.54 (H) 02/26/2019 1139   CALCIUM  9.3 02/20/2024 0559   GFRNONAA 28 (L) 02/20/2024 0559   GFRAA 52 (L) 04/02/2020 0457   CrCl cannot be calculated (Unknown ideal weight.).  COAG Lab Results  Component Value Date   INR 1.7 (H) 01/24/2024   INR 1.2 10/06/2020   INR 1.1 03/31/2020    Radiology US  Abdomen Limited RUQ (LIVER/GB) Result Date:  02/18/2024 CLINICAL DATA:  Upper abdominal pain and vomiting. EXAM: ULTRASOUND ABDOMEN LIMITED RIGHT UPPER QUADRANT COMPARISON:  CT scan 03/16/2023 FINDINGS: Gallbladder: The gallbladder is surgically absent. Common bile duct: Diameter: 5.7 mm Liver: No hepatic lesions or intrahepatic biliary dilatation. Overall normal echogenicity. Portal vein is patent on color Doppler imaging with normal direction of blood flow towards the liver. Other: No perihepatic right upper quadrant fluid collections. IMPRESSION: 1. Status post cholecystectomy. 2. No biliary dilatation. 3. Normal sonographic appearance of the liver. Electronically Signed   By: Marrian Siva M.D.   On: 02/18/2024 17:38   MR FOOT LEFT WO CONTRAST Result Date: 02/18/2024 CLINICAL DATA:  Foot pain and swelling. Diabetic with history of prior amputations. EXAM: MRI OF THE LEFT FOOT WITHOUT CONTRAST TECHNIQUE: Multiplanar, multisequence MR imaging of the left foot was performed. No intravenous contrast was administered. COMPARISON:  Radiographs 02/18/2024 FINDINGS: Abnormal T1, T2 and STIR signal intensity most notably in the navicular bone. Findings highly suspicious for osteomyelitis. Similar but less significant changes in the proximal aspect of the medial cuneiform, the middle cuneiform and the lateral cuneiform. There is also some involvement of the medial aspect of the cuboid. Associated intertarsal joint effusions are worrisome. This could be reactive but not exclude septic arthritis. I do not see any definite destructive bony changes or chondral defects. Open wound noted on the plantar aspect of the midfoot overlying the region the medial talus. Associated cellulitis and myofasciitis but no soft tissue abscess or obvious pyomyositis. IMPRESSION: 1. Findings highly suspicious for osteomyelitis involving the navicular bone, the proximal aspect of the medial cuneiform, the middle cuneiform and the lateral cuneiform. There is also some involvement of the  medial aspect of the cuboid. 2. Associated intertarsal joint effusions are worrisome. This could be reactive but not exclude septic arthritis. 3. Open wound on the plantar aspect of the midfoot overlying the region the medial talus. Associated cellulitis and myofasciitis but no soft tissue abscess or obvious pyomyositis. Electronically Signed   By: Marrian Siva M.D.   On: 02/18/2024 17:35  CT CHEST ABDOMEN PELVIS WO CONTRAST Result Date: 02/18/2024 CLINICAL DATA:  Unintentional weight loss EXAM: CT CHEST, ABDOMEN AND PELVIS WITHOUT CONTRAST TECHNIQUE: Multidetector CT imaging of the chest, abdomen and pelvis was performed following the standard protocol without IV contrast. RADIATION DOSE REDUCTION: This exam was performed according to the departmental dose-optimization program which includes automated exposure control, adjustment of the mA and/or kV according to patient size and/or use of iterative reconstruction technique. COMPARISON:  CT a chest 01/24/2024. Chest abdomen and pelvic CTs of 03/16/2023 from [facility] FINDINGS: CT CHEST FINDINGS Cardiovascular: Aortic atherosclerosis. 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 Normal heart size, without pericardial effusion. Lad and left circumflex coronary artery calcification. Mediastinum/Nodes: No mediastinal or hilar adenopathy, given limitations of unenhanced CT. Lungs/Pleura: No pleural fluid.  Mild bibasilar scarring. Musculoskeletal: Included within the abdomen pelvic section. CT ABDOMEN PELVIS FINDINGS Hepatobiliary: Normal liver. Cholecystectomy, without biliary ductal dilatation. Pancreas: Normal, without mass or ductal dilatation. Spleen: Normal in size, without focal abnormality. Adrenals/Urinary Tract: Normal adrenal glands. Mild renal cortical thinning bilaterally. Punctate interpolar left renal collecting system calculi. No hydroureter or ureteric calculi. Low-density bilateral renal lesions of maximally 1.4 cm  are likely cysts and do not warrant follow-up imaging. No bladder calculi. Stomach/Bowel: Normal stomach, without wall thickening. Normal colon and terminal ileum. Appendix not visualized. Normal small bowel. Vascular/Lymphatic: Aortic atherosclerosis. No abdominopelvic adenopathy. Reproductive: Normal uterus and adnexa. Other: No significant free fluid.  No free intraperitoneal air. Musculoskeletal: Mild T12 superior endplate compression deformity is unchanged. No ventral canal encroachment. IMPRESSION: 1. Low sensitivity exam secondary to lack of oral or IV contrast. 2. Given this limitation, no explanation for weight loss. 3. Age advanced coronary artery atherosclerosis. Recommend assessment of coronary risk factors. 4.  Aortic Atherosclerosis (ICD10-I70.0). 5. Nonobstructive left nephrolithiasis. Electronically Signed   By: Lore Rode M.D.   On: 02/18/2024 14:48   DG Foot 2 Views Left Result Date: 02/18/2024 CLINICAL DATA:  Nonhealing ulcer.  Pain. EXAM: LEFT FOOT - 2 VIEW COMPARISON:  03/23/2024. FINDINGS: Plate and screw fixation of the distal fibula with 2 cannulated screws fixating the medial malleolus as before. 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 Soft tissue ulceration is identified along the medial plantar surface of the foot underlying the navicular bone and medial cuneiform. There is progressive bony erosion along the posteromedial aspect of the medial cuneiform and anterolateral aspect of the navicular bone. No signs of acute fracture. IMPRESSION: 1. Soft tissue ulceration along the medial plantar surface of the foot adjacent to the navicular bone and medial cuneiform. 2. Progressive bony erosion along the posteromedial aspect of the medial cuneiform and anterolateral aspect of the navicular bone compatible with osteomyelitis. 3. 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 Electronically Signed   By: Kimberley Penman M.D.   On: 02/18/2024 09:15      Assessment/Plan 1. Osteomyelitis of left foot, unspecified type (HCC) (Primary) Patient should undergo left below-knee amputation at her convenience.  This is based on her osteomyelitis and nonhealing wound.  This condition has persisted and actually worsened in spite of maximal therapy.  2. Atherosclerosis of native arteries of the extremities with ulceration (HCC) See #1  3. Diabetes mellitus type 2 with complications (HCC) Continue hypoglycemic medications as already ordered, these medications have been reviewed and there are no changes at this time.  Hgb A1C to be monitored as already arranged by primary service  4. Essential hypertension Continue antihypertensive medications as already ordered, these medications have  been reviewed and there are no changes at this time.  5. Hyperlipidemia associated with type 2 diabetes mellitus (HCC) Continue statin as ordered and reviewed, no changes at this time    [name], MD  02/25/2024 3:34 PM

## 2024-02-25 NOTE — Progress Notes (Signed)
 MRN : [id number]  [name] is a 48 y.o. ([birth year]) female who presents with chief complaint of check circulation.  History of Present Illness:   The patient returns to the office for followup Patient has a chronic left foot wound with underlying osteomyelitis that is indicated for below the knee amputation. She is not systemically ill currently and there is no need for urgent or emergent surgery.    There have been no significant changes to the patient's overall health care.   The patient denies amaurosis fugax or recent TIA symptoms. There are no recent neurological changes noted. The patient denies history of DVT, PE or superficial thrombophlebitis. The patient denies recent episodes of angina or shortness of breath.    ABI's Rt=1.01 and Lt=1.01    No outpatient medications have been marked as taking for the 02/26/24 encounter (Appointment) with Prescilla Brod, Ninette Basque, MD.    Past Medical History:  Diagnosis Date   Bipolar disorder (HCC)    Chronic combined systolic and diastolic CHF (congestive heart failure) (HCC)    a. ECHO 04/23/14 LV EF: 45% - 50%,  mild LVH, diffuse hypokinesis. LV diastolic dysfunction, high ventricular filling pressure. Mild LA dilation.   Cocaine abuse (HCC)    COPD (chronic obstructive pulmonary disease) (HCC)    Diabetes mellitus without complication (HCC)    Dyspnea    ESRD on hemodialysis (HCC)    GERD (gastroesophageal reflux disease)    Heroin overdose (HCC) 10/2018   Hypertension    Intractable nausea and vomiting 02/18/2024   MRSA infection    arm wound   Nicotine  dependence with current use    Seizures (HCC)    last seizure March 2021; usual 1-2 per month    Past Surgical History:  Procedure Laterality Date   ABDOMINAL HYSTERECTOMY     ACHILLES TENDON SURGERY Left 03/31/2020   Procedure: ACHILLES LENGTHENING;  Surgeon: Pink Bridges, DPM;  Location: [facility] ORS;   Service: Podiatry;  Laterality: Left;   AMPUTATION Right 10/04/2020   Procedure: PARTIAL AMPUTATION RAY 4th and 5th;  Surgeon: Pink Bridges, DPM;  Location: [facility];  Service: Podiatry;  Laterality: Right;   AMPUTATION TOE Left 03/23/2019   Procedure: AMPUTATION TOE GREAT LEFT TOE;  Surgeon: Sharlyn Deaner, DPM;  Location: [facility];  Service: Podiatry;  Laterality: Left;   AMPUTATION TOE Left 08/16/2019   Procedure: AMPUTATION TOE 5th;  Surgeon: Pink Bridges, DPM;  Location: [facility];  Service: Podiatry;  Laterality: Left;   AMPUTATION TOE Left 02/05/2020   Procedure: AMPUTATION MPJ LEFT 4TH;  Surgeon: Pink Bridges, DPM;  Location: [facility];  Service: Podiatry;  Laterality: Left;   ANKLE FRACTURE SURGERY Left    pins/screws in place   BUBBLE STUDY  01/08/2021   Procedure: BUBBLE STUDY;  Surgeon: Lenise Quince, MD;  Location: [facility] ENDOSCOPY;  Service: Cardiovascular;;   CHOLECYSTECTOMY  2005   FRACTURE SURGERY     IRRIGATION AND DEBRIDEMENT FOOT Left 10/04/2020   Procedure: IRRIGATION AND DEBRIDEMENT FOOT;  Surgeon: Pink Bridges, DPM;  Location: [facility];  Service: Podiatry;  Laterality: Left;  LOWER EXTREMITY ANGIOGRAPHY Left 03/12/2019   Procedure: LOWER EXTREMITY ANGIOGRAPHY;  Surgeon: Jackquelyn Mass, MD;  Location: [facility]ASIVE CV [facility];  Service: Cardiovascular;  Laterality: Left;   LOWER EXTREMITY ANGIOGRAPHY Right 10/06/2020   Procedure: Lower Extremity Angiography;  Surgeon: Jackquelyn Mass, MD;  Location: [facility] CV LAB;  Service: Cardiovascular;  Laterality: Right;   TEE WITHOUT CARDIOVERSION N/A 03/26/2019   Procedure: TRANSESOPHAGEAL ECHOCARDIOGRAM (TEE);  Surgeon: Sammy Crisp, MD;  Location: [facility];  Service: Cardiovascular;  Laterality: N/A;   TEE WITHOUT CARDIOVERSION N/A 01/08/2021   Procedure: TRANSESOPHAGEAL ECHOCARDIOGRAM (TEE);  Surgeon: Lenise Quince, MD;  Location: [facility] ENDOSCOPY;  Service: Cardiovascular;  Laterality: N/A;   TRANSMETATARSAL  AMPUTATION Left 03/31/2020   Procedure: TRANSMETATARSAL AMPUTATION;  Surgeon: Pink Bridges, DPM;  Location: [facility];  Service: Podiatry;  Laterality: Left;   TUBAL LIGATION      Social History Social History   Tobacco Use   Smoking status: Every Day    Current packs/day: 1.00    Average packs/day: 1 pack/day for 28.0 years (28.0 ttl pk-yrs)    Types: Cigarettes   Smokeless tobacco: Never  Vaping Use   Vaping status: Never Used  Substance Use Topics   Alcohol use: Not Currently    Alcohol/week: 0.0 standard drinks of alcohol    Comment: Quit drinking 11/16 per pt   Drug use: Not Currently    Comment: last use 5 years ago 2016    Family History Family History  Problem Relation Age of Onset   Hypertension Other    Drug abuse Mother    Seizures Neg Hx     Allergies  Allergen Reactions   Albumin (Human)     unknown   Erythromycin Rash   Midecamycin Rash     REVIEW OF SYSTEMS (Negative unless checked)  Constitutional: [] Weight loss  [] Fever  [] Chills Cardiac: [] Chest pain   [] Chest pressure   [] Palpitations   [] Shortness of breath when laying flat   [] Shortness of breath with exertion. Vascular:  [x] Pain in legs with walking   [] Pain in legs at rest  [] History of DVT   [] Phlebitis   [] Swelling in legs   [] Varicose veins   [] Non-healing ulcers Pulmonary:   [] Uses home oxygen   [] Productive cough   [] Hemoptysis   [] Wheeze  [] COPD   [] Asthma Neurologic:  [] Dizziness   [] Seizures   [] History of stroke   [] History of TIA  [] Aphasia   [] Vissual changes   [] Weakness or numbness in arm   [] Weakness or numbness in leg Musculoskeletal:   [] Joint swelling   [] Joint pain   [] Low back pain Hematologic:  [] Easy bruising  [] Easy bleeding   [] Hypercoagulable state   [] Anemic Gastrointestinal:  [] Diarrhea   [] Vomiting  [] Gastroesophageal reflux/heartburn   [] Difficulty swallowing. Genitourinary:  [] Chronic kidney disease   [] Difficult urination  [] Frequent urination   [] Blood in  urine Skin:  [] Rashes   [] Ulcers  Psychological:  [] History of anxiety   []  History of major depression.  Physical Examination  There were no vitals filed for this visit. There is no height or weight on file to calculate BMI. Gen: WD/WN, NAD Head: [place]/AT, No temporalis wasting.  Ear/Nose/Throat: Hearing grossly intact, nares w/o erythema or drainage Eyes: PER, EOMI, sclera nonicteric.  Neck: Supple, no masses.  No bruit or JVD.  Pulmonary:  Good air movement, no audible wheezing, no use of accessory muscles.  Cardiac: RRR, normal S1, S2, no Murmurs. Vascular:  mild trophic changes, left foot  open wounds Vessel Right Left  Radial Palpable Palpable  PT  Palpable Palpable  DP Palpable  Palpable  Gastrointestinal: soft, non-distended. No guarding/no peritoneal signs.  Musculoskeletal: M/S 5/5 throughout.  No visible deformity.  Neurologic: CN 2-12 intact. Pain and light touch intact in extremities.  Symmetrical.  Speech is fluent. Motor exam as listed above. Psychiatric: Judgment intact, Mood & affect appropriate for pt's clinical situation. Dermatologic: No rashes or ulcers noted.  No changes consistent with cellulitis.   CBC Lab Results  Component Value Date   WBC 7.4 02/20/2024   HGB 11.2 (L) 02/20/2024   HCT 33.4 (L) 02/20/2024   MCV 95.4 02/20/2024   PLT 354 02/20/2024    BMET    Component Value Date/Time   NA 134 (L) 02/20/2024 0559   K 3.5 02/20/2024 0559   CL 96 (L) 02/20/2024 0559   CO2 28 02/20/2024 0559   GLUCOSE 135 (H) 02/20/2024 0559   BUN 9 02/20/2024 0559   CREATININE 2.13 (H) 02/20/2024 0559   CREATININE 1.54 (H) 02/26/2019 1139   CALCIUM  9.3 02/20/2024 0559   GFRNONAA 28 (L) 02/20/2024 0559   GFRAA 52 (L) 04/02/2020 0457   CrCl cannot be calculated (Unknown ideal weight.).  COAG Lab Results  Component Value Date   INR 1.7 (H) 01/24/2024   INR 1.2 10/06/2020   INR 1.1 03/31/2020    Radiology US  Abdomen Limited RUQ (LIVER/GB) Result Date:  02/18/2024 CLINICAL DATA:  Upper abdominal pain and vomiting. EXAM: ULTRASOUND ABDOMEN LIMITED RIGHT UPPER QUADRANT COMPARISON:  CT scan 03/16/2023 FINDINGS: Gallbladder: The gallbladder is surgically absent. Common bile duct: Diameter: 5.7 mm Liver: No hepatic lesions or intrahepatic biliary dilatation. Overall normal echogenicity. Portal vein is patent on color Doppler imaging with normal direction of blood flow towards the liver. Other: No perihepatic right upper quadrant fluid collections. IMPRESSION: 1. Status post cholecystectomy. 2. No biliary dilatation. 3. Normal sonographic appearance of the liver. Electronically Signed   By: Marrian Siva M.D.   On: 02/18/2024 17:38   MR FOOT LEFT WO CONTRAST Result Date: 02/18/2024 CLINICAL DATA:  Foot pain and swelling. Diabetic with history of prior amputations. EXAM: MRI OF THE LEFT FOOT WITHOUT CONTRAST TECHNIQUE: Multiplanar, multisequence MR imaging of the left foot was performed. No intravenous contrast was administered. COMPARISON:  Radiographs 02/18/2024 FINDINGS: Abnormal T1, T2 and STIR signal intensity most notably in the navicular bone. Findings highly suspicious for osteomyelitis. Similar but less significant changes in the proximal aspect of the medial cuneiform, the middle cuneiform and the lateral cuneiform. There is also some involvement of the medial aspect of the cuboid. Associated intertarsal joint effusions are worrisome. This could be reactive but not exclude septic arthritis. I do not see any definite destructive bony changes or chondral defects. Open wound noted on the plantar aspect of the midfoot overlying the region the medial talus. Associated cellulitis and myofasciitis but no soft tissue abscess or obvious pyomyositis. IMPRESSION: 1. Findings highly suspicious for osteomyelitis involving the navicular bone, the proximal aspect of the medial cuneiform, the middle cuneiform and the lateral cuneiform. There is also some involvement of the  medial aspect of the cuboid. 2. Associated intertarsal joint effusions are worrisome. This could be reactive but not exclude septic arthritis. 3. Open wound on the plantar aspect of the midfoot overlying the region the medial talus. Associated cellulitis and myofasciitis but no soft tissue abscess or obvious pyomyositis. Electronically Signed   By: Marrian Siva M.D.   On: 02/18/2024 17:35  CT CHEST ABDOMEN PELVIS WO CONTRAST Result Date: 02/18/2024 CLINICAL DATA:  Unintentional weight loss EXAM: CT CHEST, ABDOMEN AND PELVIS WITHOUT CONTRAST TECHNIQUE: Multidetector CT imaging of the chest, abdomen and pelvis was performed following the standard protocol without IV contrast. RADIATION DOSE REDUCTION: This exam was performed according to the departmental dose-optimization program which includes automated exposure control, adjustment of the mA and/or kV according to patient size and/or use of iterative reconstruction technique. COMPARISON:  CT a chest 01/24/2024. Chest abdomen and pelvic CTs of 03/16/2023 from [facility] FINDINGS: CT CHEST FINDINGS Cardiovascular: Aortic atherosclerosis. 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 Normal heart size, without pericardial effusion. Lad and left circumflex coronary artery calcification. Mediastinum/Nodes: No mediastinal or hilar adenopathy, given limitations of unenhanced CT. Lungs/Pleura: No pleural fluid.  Mild bibasilar scarring. Musculoskeletal: Included within the abdomen pelvic section. CT ABDOMEN PELVIS FINDINGS Hepatobiliary: Normal liver. Cholecystectomy, without biliary ductal dilatation. Pancreas: Normal, without mass or ductal dilatation. Spleen: Normal in size, without focal abnormality. Adrenals/Urinary Tract: Normal adrenal glands. Mild renal cortical thinning bilaterally. Punctate interpolar left renal collecting system calculi. No hydroureter or ureteric calculi. Low-density bilateral renal lesions of maximally 1.4 cm  are likely cysts and do not warrant follow-up imaging. No bladder calculi. Stomach/Bowel: Normal stomach, without wall thickening. Normal colon and terminal ileum. Appendix not visualized. Normal small bowel. Vascular/Lymphatic: Aortic atherosclerosis. No abdominopelvic adenopathy. Reproductive: Normal uterus and adnexa. Other: No significant free fluid.  No free intraperitoneal air. Musculoskeletal: Mild T12 superior endplate compression deformity is unchanged. No ventral canal encroachment. IMPRESSION: 1. Low sensitivity exam secondary to lack of oral or IV contrast. 2. Given this limitation, no explanation for weight loss. 3. Age advanced coronary artery atherosclerosis. Recommend assessment of coronary risk factors. 4.  Aortic Atherosclerosis (ICD10-I70.0). 5. Nonobstructive left nephrolithiasis. Electronically Signed   By: Lore Rode M.D.   On: 02/18/2024 14:48   DG Foot 2 Views Left Result Date: 02/18/2024 CLINICAL DATA:  Nonhealing ulcer.  Pain. EXAM: LEFT FOOT - 2 VIEW COMPARISON:  03/23/2024. FINDINGS: Plate and screw fixation of the distal fibula with 2 cannulated screws fixating the medial malleolus as before. 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 Soft tissue ulceration is identified along the medial plantar surface of the foot underlying the navicular bone and medial cuneiform. There is progressive bony erosion along the posteromedial aspect of the medial cuneiform and anterolateral aspect of the navicular bone. No signs of acute fracture. IMPRESSION: 1. Soft tissue ulceration along the medial plantar surface of the foot adjacent to the navicular bone and medial cuneiform. 2. Progressive bony erosion along the posteromedial aspect of the medial cuneiform and anterolateral aspect of the navicular bone compatible with osteomyelitis. 3. 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 Electronically Signed   By: Kimberley Penman M.D.   On: 02/18/2024 09:15      Assessment/Plan 1. Osteomyelitis of left foot, unspecified type (HCC) (Primary) Patient should undergo left below-knee amputation at her convenience.  This is based on her osteomyelitis and nonhealing wound.  This condition has persisted and actually worsened in spite of maximal therapy.  2. Atherosclerosis of native arteries of the extremities with ulceration (HCC) See #1  3. Diabetes mellitus type 2 with complications (HCC) Continue hypoglycemic medications as already ordered, these medications have been reviewed and there are no changes at this time.  Hgb A1C to be monitored as already arranged by primary service  4. Essential hypertension Continue antihypertensive medications as already ordered, these medications have  been reviewed and there are no changes at this time.  5. Hyperlipidemia associated with type 2 diabetes mellitus (HCC) Continue statin as ordered and reviewed, no changes at this time    [name], MD  02/25/2024 3:34 PM

## 2024-02-26 ENCOUNTER — Ambulatory Visit (INDEPENDENT_AMBULATORY_CARE_PROVIDER_SITE_OTHER): Payer: MEDICAID | Admitting: Vascular Surgery

## 2024-02-26 VITALS — BP 113/69 | HR 77 | Resp 17 | Ht 69.0 in | Wt 137.0 lb

## 2024-02-26 DIAGNOSIS — I10 Essential (primary) hypertension: Secondary | ICD-10-CM | POA: Diagnosis not present

## 2024-02-26 DIAGNOSIS — I7025 Atherosclerosis of native arteries of other extremities with ulceration: Secondary | ICD-10-CM

## 2024-02-26 DIAGNOSIS — E785 Hyperlipidemia, unspecified: Secondary | ICD-10-CM

## 2024-02-26 DIAGNOSIS — M869 Osteomyelitis, unspecified: Secondary | ICD-10-CM

## 2024-02-26 DIAGNOSIS — E118 Type 2 diabetes mellitus with unspecified complications: Secondary | ICD-10-CM

## 2024-02-26 DIAGNOSIS — E1169 Type 2 diabetes mellitus with other specified complication: Secondary | ICD-10-CM

## 2024-02-28 ENCOUNTER — Ambulatory Visit: Payer: MEDICAID | Admitting: Physician Assistant

## 2024-02-28 ENCOUNTER — Telehealth (INDEPENDENT_AMBULATORY_CARE_PROVIDER_SITE_OTHER): Payer: Self-pay

## 2024-02-28 NOTE — Telephone Encounter (Signed)
 Spoke with the patient and she was scheduled with Dr. Prescilla Brod for a BKA on 03/06/24 and before I could call the patient with pre-surgical instructions she called to move the surgery out to 03/13/24 stating she has to come to terms with the surgery. Patient surgery has been moved to 03/13/24.

## 2024-03-03 ENCOUNTER — Encounter (INDEPENDENT_AMBULATORY_CARE_PROVIDER_SITE_OTHER): Payer: Self-pay | Admitting: Vascular Surgery

## 2024-03-05 ENCOUNTER — Other Ambulatory Visit (INDEPENDENT_AMBULATORY_CARE_PROVIDER_SITE_OTHER): Payer: Self-pay | Admitting: Nurse Practitioner

## 2024-03-05 DIAGNOSIS — M869 Osteomyelitis, unspecified: Secondary | ICD-10-CM

## 2024-03-06 ENCOUNTER — Ambulatory Visit: Payer: MEDICAID | Admitting: Physician Assistant

## 2024-03-06 ENCOUNTER — Encounter
Admission: RE | Admit: 2024-03-06 | Discharge: 2024-03-06 | Disposition: A | Discharge status: Home / Self Care | Payer: MEDICAID | Source: Ambulatory Visit | Attending: Vascular Surgery | Admitting: Vascular Surgery

## 2024-03-06 ENCOUNTER — Other Ambulatory Visit: Payer: Self-pay

## 2024-03-06 VITALS — BP 109/83 | Resp 18 | Ht 69.0 in | Wt 131.6 lb

## 2024-03-06 DIAGNOSIS — E119 Type 2 diabetes mellitus without complications: Secondary | ICD-10-CM | POA: Insufficient documentation

## 2024-03-06 DIAGNOSIS — M869 Osteomyelitis, unspecified: Secondary | ICD-10-CM | POA: Diagnosis not present

## 2024-03-06 DIAGNOSIS — Z01812 Encounter for preprocedural laboratory examination: Secondary | ICD-10-CM | POA: Diagnosis present

## 2024-03-06 HISTORY — DX: Cerebral infarction, unspecified: I63.9

## 2024-03-06 HISTORY — DX: Personal history of urinary calculi: Z87.442

## 2024-03-06 HISTORY — DX: Pneumonia, unspecified organism: J18.9

## 2024-03-06 HISTORY — DX: Acute embolism and thrombosis of unspecified popliteal vein: I82.439

## 2024-03-06 HISTORY — DX: Other complications of anesthesia, initial encounter: T88.59XA

## 2024-03-06 HISTORY — DX: Malignant (primary) neoplasm, unspecified: C80.1

## 2024-03-06 HISTORY — DX: Anemia, unspecified: D64.9

## 2024-03-06 HISTORY — DX: Rheumatic mitral stenosis: I05.0

## 2024-03-06 HISTORY — DX: Anxiety disorder, unspecified: F41.9

## 2024-03-06 HISTORY — DX: Depression, unspecified: F32.A

## 2024-03-06 HISTORY — DX: Diarrhea, unspecified: R19.7

## 2024-03-06 LAB — TYPE AND SCREEN
ABO/RH(D): A POS
Antibody Screen: NEGATIVE

## 2024-03-06 LAB — CBC WITH DIFFERENTIAL/PLATELET
Abs Immature Granulocytes: 0.02 10*3/uL (ref 0.00–0.07)
Basophils Absolute: 0.1 10*3/uL (ref 0.0–0.1)
Basophils Relative: 1 %
Eosinophils Absolute: 0.1 10*3/uL (ref 0.0–0.5)
Eosinophils Relative: 2 %
HCT: 35.7 % — ABNORMAL LOW (ref 36.0–46.0)
Hemoglobin: 11.8 g/dL — ABNORMAL LOW (ref 12.0–15.0)
Immature Granulocytes: 0 %
Lymphocytes Relative: 14 %
Lymphs Abs: 1.1 10*3/uL (ref 0.7–4.0)
MCH: 31.6 pg (ref 26.0–34.0)
MCHC: 33.1 g/dL (ref 30.0–36.0)
MCV: 95.7 fL (ref 80.0–100.0)
Monocytes Absolute: 0.5 10*3/uL (ref 0.1–1.0)
Monocytes Relative: 7 %
Neutro Abs: 5.8 10*3/uL (ref 1.7–7.7)
Neutrophils Relative %: 76 %
Platelets: 465 10*3/uL — ABNORMAL HIGH (ref 150–400)
RBC: 3.73 MIL/uL — ABNORMAL LOW (ref 3.87–5.11)
RDW: 13.7 % (ref 11.5–15.5)
WBC: 7.6 10*3/uL (ref 4.0–10.5)
nRBC: 0 % (ref 0.0–0.2)

## 2024-03-06 LAB — BASIC METABOLIC PANEL WITH GFR
Anion gap: 15 (ref 5–15)
BUN: 37 mg/dL — ABNORMAL HIGH (ref 6–20)
CO2: 26 mmol/L (ref 22–32)
Calcium: 9.4 mg/dL (ref 8.9–10.3)
Chloride: 98 mmol/L (ref 98–111)
Creatinine, Ser: 2.84 mg/dL — ABNORMAL HIGH (ref 0.44–1.00)
GFR, Estimated: 20 mL/min — ABNORMAL LOW (ref >60)
Glucose, Bld: 104 mg/dL — ABNORMAL HIGH (ref 70–99)
Potassium: 3 mmol/L — ABNORMAL LOW (ref 3.5–5.1)
Sodium: 139 mmol/L (ref 135–145)

## 2024-03-06 LAB — SURGICAL PCR SCREEN
MRSA, PCR: NEGATIVE
Staphylococcus aureus: NEGATIVE

## 2024-03-06 NOTE — Patient Instructions (Addendum)
 Your procedure is scheduled on: 03/13/24 - Wednesday Report to the Registration Desk on the 1st floor of the [facility]. To find out your arrival time, please call [phone] between 1PM - 3PM on: 03/12/24 - Tuesday If your arrival time is 6:00 am, do not arrive before that time as the [facility] Mall entrance doors do not open until 6:00 am.  REMEMBER: Instructions that are not followed completely may result in serious medical risk, up to and including death; or upon the discretion of your surgeon and anesthesiologist your surgery may need to be rescheduled.  Do not eat food or drink any liquids after midnight the night before surgery.  No gum chewing or hard candies.  One week prior to surgery: Stop Anti-inflammatories (NSAIDS) such as Advil, Aleve , Ibuprofen, Motrin, Naproxen , Naprosyn  and Aspirin  based products such as Excedrin, Goody's Powder, BC Powder. You may take Tylenol  if needed for pain up until the day of surgery.  Stop ANY OVER THE COUNTER supplements until after surgery.  HOLD apixaban  (ELIQUIS ) beginning 03/11/24, may resume with doctors orders.  ON THE DAY OF SURGERY ONLY TAKE THESE MEDICATIONS WITH SIPS OF WATER:  pantoprazole  (PROTONIX )  oxyCODONE -acetaminophen  if needed   No Alcohol for 24 hours before or after surgery.  No Smoking including e-cigarettes for 24 hours before surgery.  No chewable tobacco products for at least 6 hours before surgery.  No nicotine  patches on the day of surgery.  Do not use any "recreational" drugs for at least a week (preferably 2 weeks) before your surgery.  Please be advised that the combination of cocaine and anesthesia may have negative outcomes, up to and including death. If you test positive for cocaine, your surgery will be cancelled.  On the morning of surgery brush your teeth with toothpaste and water, you may rinse your mouth with mouthwash if you wish. Do not swallow any toothpaste or mouthwash.  Use CHG Soap or  wipes as directed on instruction sheet.  Do not wear jewelry, make-up, hairpins, clips or nail polish.  For welded (permanent) jewelry: bracelets, anklets, waist bands, etc.  Please have this removed prior to surgery.  If it is not removed, there is a chance that hospital personnel will need to cut it off on the day of surgery.  Do not wear lotions, powders, or perfumes.   Do not shave body hair from the neck down 48 hours before surgery.  Contact lenses, hearing aids and dentures may not be worn into surgery.  Do not bring valuables to the hospital. [facility] is not responsible for any missing/lost belongings or valuables.   Notify your doctor if there is any change in your medical condition (cold, fever, infection).  Wear comfortable clothing (specific to your surgery type) to the hospital.  After surgery, you can help prevent lung complications by doing breathing exercises.  Take deep breaths and cough every 1-2 hours. Your doctor may order a device called an Incentive Spirometer to help you take deep breaths. When coughing or sneezing, hold a pillow firmly against your incision with both hands. This is called "splinting." Doing this helps protect your incision. It also decreases belly discomfort.  If you are being admitted to the hospital overnight, leave your suitcase in the car. After surgery it may be brought to your room.  In case of increased patient census, it may be necessary for you, the patient, to continue your postoperative care in the Same Day Surgery department.  If you are being discharged the  day of surgery, you will not be allowed to drive home. You will need a responsible individual to drive you home and stay with you for 24 hours after surgery.   If you are taking public transportation, you will need to have a responsible individual with you.  Please call the Pre-admissions Testing Dept. at [phone] if you have any questions about these  instructions.  Surgery Visitation Policy:  Patients having surgery or a procedure may have two visitors.  Children under the age of 109 must have an adult with them who is not the patient.  Inpatient Visitation:    Visiting hours are 7 a.m. to 8 p.m. Up to four visitors are allowed at one time in a patient room. The visitors may rotate out with other people during the day.  One visitor age 75 or older may stay with the patient overnight and must be in the room by 8 p.m.    Preparing for Surgery with CHLORHEXIDINE  GLUCONATE (CHG) Soap  Chlorhexidine  Gluconate (CHG) Soap  o An antiseptic cleaner that kills germs and bonds with the skin to continue killing germs even after washing  o Used for showering the night before surgery and morning of surgery  Before surgery, you can play an important role by reducing the number of germs on your skin.  CHG (Chlorhexidine  gluconate) soap is an antiseptic cleanser which kills germs and bonds with the skin to continue killing germs even after washing.  Please do not use if you have an allergy to CHG or antibacterial soaps. If your skin becomes reddened/irritated stop using the CHG.  1. Shower the NIGHT BEFORE SURGERY and the MORNING OF SURGERY with CHG soap.  2. If you choose to wash your hair, wash your hair first as usual with your normal shampoo.  3. After shampooing, rinse your hair and body thoroughly to remove the shampoo.  4. Use CHG as you would any other liquid soap. You can apply CHG directly to the skin and wash gently with a scrungie or a clean washcloth.  5. Apply the CHG soap to your body only from the neck down. Do not use on open wounds or open sores. Avoid contact with your eyes, ears, mouth, and genitals (private parts). Wash face and genitals (private parts) with your normal soap.  6. Wash thoroughly, paying special attention to the area where your surgery will be performed.  7. Thoroughly rinse your body with warm  water.  8. Do not shower/wash with your normal soap after using and rinsing off the CHG soap.  9. Pat yourself dry with a clean towel.  10. Wear clean pajamas to bed the night before surgery.  12. Place clean sheets on your bed the night of your first shower and do not sleep with pets.  13. Shower again with the CHG soap on the day of surgery prior to arriving at the hospital.  14. Do not apply any deodorants/lotions/powders.  15. Please wear clean clothes to the hospital.

## 2024-03-07 ENCOUNTER — Encounter: Payer: Self-pay | Admitting: Vascular Surgery

## 2024-03-07 NOTE — Progress Notes (Signed)
   [facility] Perioperative Services: Pre-Admission/Anesthesia Testing  Abnormal Lab Notification and Treatment Plan of Care   Date: 03/07/24  Name: [name] MRN:   [id number]  Re: Abnormal labs noted during PAT appointment   Notified:  Provider Name Provider Role Notification Mode  Sharla Davis NP-C Vascular Surgery (APP) Routed and/or faxed via [facility]   Clinical Information and Notes:  ABNORMAL LAB VALUE(S): Lab Results  Component Value Date   K 3.0 (L) 03/06/2024   [name] is scheduled for an elective AMPUTATION BELOW KNEE (Left: Knee) on 03/13/2024. In review of her medication reconciliation, it is noted that the patient is taking prescribed diuretic medications (furosemide  80 mg) daily.   Please note, in efforts to promote a safe and effective anesthetic course, per current guidelines/standards set by the [facility] anesthesia team, the minimal acceptable K+ level for the patient to proceed with general anesthesia is 3.0 mmol/L. With that being said, if the patient drops any lower, her elective procedure will need to be postponed until K+ is better optimized.   In efforts to prevent case cancellation, and ultimately to promote the safety of this patient undergoing sedation/anesthesia, communicating result to surgical team. Patient is on hemodialysis. Office is in communication with nephrology/dialysis providers and can make recommendations regarding need for K+ optimization.   Order entered to have K+ rechecked on the day of the surgery.   Renate Caroline, MSN, APRN, FNP-C, CEN [facility]  Perioperative Services Nurse Practitioner Phone: [phone] 03/07/24 8:57 AM  NOTE: This note has been prepared using Dragon dictation software. Despite my best ability to proofread, there is always the potential that unintentional transcriptional errors may still occur from this process.

## 2024-03-08 ENCOUNTER — Telehealth (INDEPENDENT_AMBULATORY_CARE_PROVIDER_SITE_OTHER): Payer: Self-pay

## 2024-03-08 NOTE — Telephone Encounter (Signed)
 Patient called stating she is violently vomiting and it has been going on since yesterday. Patient stated that Dr. Prescilla Brod told her that her sickness was coming from her leg as well. Patient was advised to go to the ED for evaluation.

## 2024-03-11 ENCOUNTER — Telehealth (INDEPENDENT_AMBULATORY_CARE_PROVIDER_SITE_OTHER): Payer: Self-pay

## 2024-03-11 NOTE — Telephone Encounter (Signed)
 Spoke with the patient and she is aware of pre-surgical instructions and had no questions.

## 2024-03-12 ENCOUNTER — Encounter (INDEPENDENT_AMBULATORY_CARE_PROVIDER_SITE_OTHER): Payer: Self-pay

## 2024-03-12 MED ORDER — CEFAZOLIN SODIUM-DEXTROSE 2-4 GM/100ML-% IV SOLN
2.0000 g | INTRAVENOUS | Status: AC
Start: 2024-03-13 — End: 2024-03-13
  Administered 2024-03-13: 2 g via INTRAVENOUS

## 2024-03-12 MED ORDER — SODIUM CHLORIDE 0.9% FLUSH
3.0000 mL | Freq: Two times a day (BID) | INTRAVENOUS | Status: DC
Start: 2024-03-12 — End: 2024-03-13
  Administered 2024-03-13: 3 mL via INTRAVENOUS

## 2024-03-12 MED ORDER — CHLORHEXIDINE GLUCONATE CLOTH 2 % EX PADS: 6.0000 | MEDICATED_PAD | Freq: Once | CUTANEOUS | Status: DC

## 2024-03-12 MED ORDER — CHLORHEXIDINE GLUCONATE 0.12 % MT SOLN
15.0000 mL | Freq: Once | OROMUCOSAL | Status: AC
  Administered 2024-03-13: 15 mL via OROMUCOSAL

## 2024-03-12 MED ORDER — SODIUM CHLORIDE 0.9% FLUSH: 3.0000 mL | INTRAVENOUS | Status: DC | PRN

## 2024-03-12 MED ORDER — ORAL CARE MOUTH RINSE: 15.0000 mL | Freq: Once | OROMUCOSAL | Status: AC

## 2024-03-12 NOTE — Telephone Encounter (Signed)
 Patient called back with questions about her surgery and who to call. Patient was given information again.

## 2024-03-13 ENCOUNTER — Ambulatory Visit: Payer: MEDICAID | Admitting: Physician Assistant

## 2024-03-13 ENCOUNTER — Other Ambulatory Visit: Payer: Self-pay

## 2024-03-13 ENCOUNTER — Inpatient Hospital Stay: Payer: MEDICAID | Admitting: Urgent Care

## 2024-03-13 ENCOUNTER — Inpatient Hospital Stay
Admission: RE | Admit: 2024-03-13 | Discharge: 2024-03-19 | DRG: 617 | Disposition: A | Discharge status: Home / Self Care | Payer: MEDICAID | Attending: Vascular Surgery | Admitting: Vascular Surgery

## 2024-03-13 ENCOUNTER — Encounter
Admission: RE | Disposition: A | Discharge status: Home / Self Care | Payer: Self-pay | Source: Home / Self Care | Attending: Vascular Surgery

## 2024-03-13 ENCOUNTER — Encounter: Payer: Self-pay | Admitting: Vascular Surgery

## 2024-03-13 DIAGNOSIS — I7025 Atherosclerosis of native arteries of other extremities with ulceration: Secondary | ICD-10-CM | POA: Diagnosis present

## 2024-03-13 DIAGNOSIS — M86672 Other chronic osteomyelitis, left ankle and foot: Secondary | ICD-10-CM | POA: Diagnosis present

## 2024-03-13 DIAGNOSIS — E785 Hyperlipidemia, unspecified: Secondary | ICD-10-CM | POA: Diagnosis present

## 2024-03-13 DIAGNOSIS — Z881 Allergy status to other antibiotic agents status: Secondary | ICD-10-CM

## 2024-03-13 DIAGNOSIS — Z8249 Family history of ischemic heart disease and other diseases of the circulatory system: Secondary | ICD-10-CM | POA: Diagnosis not present

## 2024-03-13 DIAGNOSIS — Z79899 Other long term (current) drug therapy: Principal | ICD-10-CM

## 2024-03-13 DIAGNOSIS — Z8541 Personal history of malignant neoplasm of cervix uteri: Secondary | ICD-10-CM

## 2024-03-13 DIAGNOSIS — Z8614 Personal history of Methicillin resistant Staphylococcus aureus infection: Secondary | ICD-10-CM | POA: Diagnosis not present

## 2024-03-13 DIAGNOSIS — N2581 Secondary hyperparathyroidism of renal origin: Secondary | ICD-10-CM | POA: Diagnosis present

## 2024-03-13 DIAGNOSIS — E1122 Type 2 diabetes mellitus with diabetic chronic kidney disease: Secondary | ICD-10-CM | POA: Diagnosis present

## 2024-03-13 DIAGNOSIS — Z560 Unemployment, unspecified: Secondary | ICD-10-CM

## 2024-03-13 DIAGNOSIS — Z8673 Personal history of transient ischemic attack (TIA), and cerebral infarction without residual deficits: Secondary | ICD-10-CM | POA: Diagnosis not present

## 2024-03-13 DIAGNOSIS — I132 Hypertensive heart and chronic kidney disease with heart failure and with stage 5 chronic kidney disease, or end stage renal disease: Secondary | ICD-10-CM | POA: Diagnosis present

## 2024-03-13 DIAGNOSIS — D631 Anemia in chronic kidney disease: Secondary | ICD-10-CM | POA: Diagnosis present

## 2024-03-13 DIAGNOSIS — Z9049 Acquired absence of other specified parts of digestive tract: Secondary | ICD-10-CM

## 2024-03-13 DIAGNOSIS — E1152 Type 2 diabetes mellitus with diabetic peripheral angiopathy with gangrene: Secondary | ICD-10-CM | POA: Diagnosis present

## 2024-03-13 DIAGNOSIS — I272 Pulmonary hypertension, unspecified: Secondary | ICD-10-CM | POA: Diagnosis present

## 2024-03-13 DIAGNOSIS — J449 Chronic obstructive pulmonary disease, unspecified: Secondary | ICD-10-CM | POA: Diagnosis present

## 2024-03-13 DIAGNOSIS — F17210 Nicotine dependence, cigarettes, uncomplicated: Secondary | ICD-10-CM | POA: Diagnosis present

## 2024-03-13 DIAGNOSIS — M869 Osteomyelitis, unspecified: Secondary | ICD-10-CM | POA: Diagnosis present

## 2024-03-13 DIAGNOSIS — Z992 Dependence on renal dialysis: Secondary | ICD-10-CM

## 2024-03-13 DIAGNOSIS — E119 Type 2 diabetes mellitus without complications: Secondary | ICD-10-CM

## 2024-03-13 DIAGNOSIS — Z86718 Personal history of other venous thrombosis and embolism: Secondary | ICD-10-CM | POA: Diagnosis not present

## 2024-03-13 DIAGNOSIS — I96 Gangrene, not elsewhere classified: Secondary | ICD-10-CM

## 2024-03-13 DIAGNOSIS — N186 End stage renal disease: Secondary | ICD-10-CM | POA: Diagnosis present

## 2024-03-13 DIAGNOSIS — E11621 Type 2 diabetes mellitus with foot ulcer: Secondary | ICD-10-CM | POA: Diagnosis present

## 2024-03-13 DIAGNOSIS — Z888 Allergy status to other drugs, medicaments and biological substances status: Secondary | ICD-10-CM

## 2024-03-13 DIAGNOSIS — L97529 Non-pressure chronic ulcer of other part of left foot with unspecified severity: Secondary | ICD-10-CM | POA: Diagnosis present

## 2024-03-13 DIAGNOSIS — Z01812 Encounter for preprocedural laboratory examination: Secondary | ICD-10-CM

## 2024-03-13 DIAGNOSIS — K219 Gastro-esophageal reflux disease without esophagitis: Secondary | ICD-10-CM | POA: Diagnosis present

## 2024-03-13 DIAGNOSIS — Z89422 Acquired absence of other left toe(s): Secondary | ICD-10-CM

## 2024-03-13 DIAGNOSIS — I5042 Chronic combined systolic (congestive) and diastolic (congestive) heart failure: Secondary | ICD-10-CM | POA: Diagnosis present

## 2024-03-13 DIAGNOSIS — Z813 Family history of other psychoactive substance abuse and dependence: Secondary | ICD-10-CM

## 2024-03-13 DIAGNOSIS — F319 Bipolar disorder, unspecified: Secondary | ICD-10-CM | POA: Diagnosis present

## 2024-03-13 DIAGNOSIS — R112 Nausea with vomiting, unspecified: Secondary | ICD-10-CM | POA: Diagnosis not present

## 2024-03-13 DIAGNOSIS — E1169 Type 2 diabetes mellitus with other specified complication: Secondary | ICD-10-CM | POA: Diagnosis present

## 2024-03-13 DIAGNOSIS — Z90710 Acquired absence of both cervix and uterus: Secondary | ICD-10-CM

## 2024-03-13 HISTORY — DX: Acute embolism and thrombosis of left popliteal vein: I82.432

## 2024-03-13 HISTORY — PX: AMPUTATION: SHX166

## 2024-03-13 HISTORY — DX: Malignant neoplasm of cervix uteri, unspecified: C53.9

## 2024-03-13 LAB — CREATININE, SERUM
Creatinine, Ser: 2.85 mg/dL — ABNORMAL HIGH (ref 0.44–1.00)
GFR, Estimated: 20 mL/min — ABNORMAL LOW (ref >60)

## 2024-03-13 LAB — CBC
HCT: 27.1 % — ABNORMAL LOW (ref 36.0–46.0)
Hemoglobin: 9.1 g/dL — ABNORMAL LOW (ref 12.0–15.0)
MCH: 32.7 pg (ref 26.0–34.0)
MCHC: 33.6 g/dL (ref 30.0–36.0)
MCV: 97.5 fL (ref 80.0–100.0)
Platelets: 331 10*3/uL (ref 150–400)
RBC: 2.78 MIL/uL — ABNORMAL LOW (ref 3.87–5.11)
RDW: 14.4 % (ref 11.5–15.5)
WBC: 12 10*3/uL — ABNORMAL HIGH (ref 4.0–10.5)
nRBC: 0 % (ref 0.0–0.2)

## 2024-03-13 LAB — ABO/RH: ABO/RH(D): A POS

## 2024-03-13 LAB — POCT I-STAT, CHEM 8
BUN: 25 mg/dL — ABNORMAL HIGH (ref 6–20)
Calcium, Ion: 1.1 mmol/L — ABNORMAL LOW (ref 1.15–1.40)
Chloride: 98 mmol/L (ref 98–111)
Creatinine, Ser: 3 mg/dL — ABNORMAL HIGH (ref 0.44–1.00)
Glucose, Bld: 114 mg/dL — ABNORMAL HIGH (ref 70–99)
HCT: 32 % — ABNORMAL LOW (ref 36.0–46.0)
Hemoglobin: 10.9 g/dL — ABNORMAL LOW (ref 12.0–15.0)
Potassium: 3.6 mmol/L (ref 3.5–5.1)
Sodium: 137 mmol/L (ref 135–145)
TCO2: 28 mmol/L (ref 22–32)

## 2024-03-13 LAB — GLUCOSE, CAPILLARY
Glucose-Capillary: 167 mg/dL — ABNORMAL HIGH (ref 70–99)
Glucose-Capillary: 317 mg/dL — ABNORMAL HIGH (ref 70–99)
Glucose-Capillary: 237 mg/dL — ABNORMAL HIGH (ref 70–99)

## 2024-03-13 SURGERY — AMPUTATION BELOW KNEE
Anesthesia: General | Site: Knee | Laterality: Left

## 2024-03-13 MED ORDER — DEXMEDETOMIDINE HCL IN NACL 80 MCG/20ML IV SOLN
INTRAVENOUS | Status: DC | PRN
  Administered 2024-03-13: 8 ug via INTRAVENOUS

## 2024-03-13 MED ORDER — GABAPENTIN 300 MG PO CAPS
1200.0000 mg | ORAL_CAPSULE | Freq: Every evening | ORAL | Status: DC | PRN
  Administered 2024-03-13 – 2024-03-18 (×6): 1200 mg via ORAL
  Filled 2024-03-13 (×6): qty 4

## 2024-03-13 MED ORDER — OXYCODONE HCL 5 MG PO TABS
ORAL_TABLET | ORAL | Status: AC
  Filled 2024-03-13: qty 2

## 2024-03-13 MED ORDER — OXYCODONE HCL 5 MG PO TABS
ORAL_TABLET | ORAL | Status: AC
  Filled 2024-03-13: qty 1

## 2024-03-13 MED ORDER — EPHEDRINE SULFATE (PRESSORS) 50 MG/ML IJ SOLN
INTRAMUSCULAR | Status: AC
  Filled 2024-03-13: qty 1

## 2024-03-13 MED ORDER — ONDANSETRON HCL 4 MG/2ML IJ SOLN
INTRAMUSCULAR | Status: DC | PRN
  Administered 2024-03-13: 4 mg via INTRAVENOUS

## 2024-03-13 MED ORDER — FENTANYL CITRATE (PF) 100 MCG/2ML IJ SOLN
INTRAMUSCULAR | Status: DC | PRN
Start: 2024-03-13 — End: 2024-03-13
  Administered 2024-03-13 (×2): 50 ug via INTRAVENOUS

## 2024-03-13 MED ORDER — MIDAZOLAM HCL 2 MG/2ML IJ SOLN
INTRAMUSCULAR | Status: DC | PRN
Start: 2024-03-13 — End: 2024-03-13
  Administered 2024-03-13: 2 mg via INTRAVENOUS

## 2024-03-13 MED ORDER — OXYCODONE HCL 5 MG/5ML PO SOLN: 5.0000 mg | Freq: Once | ORAL | Status: AC | PRN

## 2024-03-13 MED ORDER — FUROSEMIDE 40 MG PO TABS
40.0000 mg | ORAL_TABLET | Freq: Every day | ORAL | Status: DC
Start: 2024-03-14 — End: 2024-03-13

## 2024-03-13 MED ORDER — INSULIN ASPART 100 UNIT/ML IJ SOLN
0.0000 [IU] | Freq: Every day | INTRAMUSCULAR | Status: DC
  Administered 2024-03-13: 2 [IU] via SUBCUTANEOUS
  Filled 2024-03-13 (×2): qty 1

## 2024-03-13 MED ORDER — MIDAZOLAM HCL 2 MG/2ML IJ SOLN
INTRAMUSCULAR | Status: AC
  Filled 2024-03-13: qty 2

## 2024-03-13 MED ORDER — ACETAMINOPHEN 500 MG PO TABS
ORAL_TABLET | ORAL | Status: AC
  Filled 2024-03-13: qty 2

## 2024-03-13 MED ORDER — SUCCINYLCHOLINE CHLORIDE 200 MG/10ML IV SOSY
PREFILLED_SYRINGE | INTRAVENOUS | Status: DC | PRN
  Administered 2024-03-13: 30 mg via INTRAVENOUS

## 2024-03-13 MED ORDER — BUPIVACAINE LIPOSOME 1.3 % IJ SUSP
INTRAMUSCULAR | Status: AC
  Filled 2024-03-13: qty 20

## 2024-03-13 MED ORDER — FUROSEMIDE 40 MG PO TABS
40.0000 mg | ORAL_TABLET | Freq: Every day | ORAL | Status: DC
  Administered 2024-03-13 – 2024-03-17 (×5): 40 mg via ORAL
  Filled 2024-03-13 (×5): qty 1

## 2024-03-13 MED ORDER — ACETAMINOPHEN 10 MG/ML IV SOLN
INTRAVENOUS | Status: DC | PRN
  Administered 2024-03-13: 1000 mg via INTRAVENOUS

## 2024-03-13 MED ORDER — PHENYLEPHRINE 80 MCG/ML (10ML) SYRINGE FOR IV PUSH (FOR BLOOD PRESSURE SUPPORT)
PREFILLED_SYRINGE | INTRAVENOUS | Status: DC | PRN
  Administered 2024-03-13 (×2): 80 ug via INTRAVENOUS

## 2024-03-13 MED ORDER — VASOPRESSIN 20 UNIT/ML IV SOLN
INTRAVENOUS | Status: AC
  Filled 2024-03-13: qty 1

## 2024-03-13 MED ORDER — CEFAZOLIN SODIUM-DEXTROSE 2-4 GM/100ML-% IV SOLN
INTRAVENOUS | Status: AC
  Filled 2024-03-13: qty 100

## 2024-03-13 MED ORDER — VASOPRESSIN 20 UNIT/ML IV SOLN
INTRAVENOUS | Status: DC | PRN
  Administered 2024-03-13 (×2): 2 [IU] via INTRAVENOUS

## 2024-03-13 MED ORDER — PROPOFOL 10 MG/ML IV BOLUS
INTRAVENOUS | Status: DC | PRN
  Administered 2024-03-13: 100 mg via INTRAVENOUS

## 2024-03-13 MED ORDER — PHENYLEPHRINE 80 MCG/ML (10ML) SYRINGE FOR IV PUSH (FOR BLOOD PRESSURE SUPPORT)
PREFILLED_SYRINGE | INTRAVENOUS | Status: AC
  Filled 2024-03-13: qty 10

## 2024-03-13 MED ORDER — LIDOCAINE HCL (CARDIAC) PF 100 MG/5ML IV SOSY
PREFILLED_SYRINGE | INTRAVENOUS | Status: DC | PRN
  Administered 2024-03-13: 60 mg via INTRAVENOUS

## 2024-03-13 MED ORDER — EPHEDRINE SULFATE (PRESSORS) 50 MG/ML IJ SOLN
10.0000 mg | Freq: Once | INTRAMUSCULAR | Status: AC
  Administered 2024-03-13: 10 mg via INTRAVENOUS

## 2024-03-13 MED ORDER — EPHEDRINE SULFATE-NACL 50-0.9 MG/10ML-% IV SOSY
PREFILLED_SYRINGE | INTRAVENOUS | Status: DC | PRN
  Administered 2024-03-13 (×2): 10 mg via INTRAVENOUS
  Administered 2024-03-13: 5 mg via INTRAVENOUS

## 2024-03-13 MED ORDER — BUPIVACAINE LIPOSOME 1.3 % IJ SUSP
INTRAMUSCULAR | Status: DC | PRN
  Administered 2024-03-13: 50 mL

## 2024-03-13 MED ORDER — SODIUM CHLORIDE 0.9 % IV SOLN: INTRAVENOUS | Status: DC | PRN

## 2024-03-13 MED ORDER — PHENYLEPHRINE HCL-NACL 20-0.9 MG/250ML-% IV SOLN
INTRAVENOUS | Status: AC
  Filled 2024-03-13: qty 250

## 2024-03-13 MED ORDER — CEFAZOLIN SODIUM-DEXTROSE 2-4 GM/100ML-% IV SOLN
2.0000 g | Freq: Three times a day (TID) | INTRAVENOUS | Status: AC
  Administered 2024-03-13 – 2024-03-14 (×2): 2 g via INTRAVENOUS
  Filled 2024-03-13: qty 100

## 2024-03-13 MED ORDER — VASOPRESSIN 20 UNIT/ML IV SOLN
2.0000 [IU] | Freq: Once | INTRAVENOUS | Status: AC
  Administered 2024-03-13: 2 [IU] via INTRAVENOUS

## 2024-03-13 MED ORDER — VASHE WOUND IRRIGATION OPTIME
TOPICAL | Status: DC | PRN
  Administered 2024-03-13: 34 [oz_av]

## 2024-03-13 MED ORDER — FENTANYL CITRATE (PF) 100 MCG/2ML IJ SOLN
INTRAMUSCULAR | Status: AC
  Filled 2024-03-13: qty 2

## 2024-03-13 MED ORDER — FENTANYL CITRATE (PF) 100 MCG/2ML IJ SOLN
25.0000 ug | INTRAMUSCULAR | Status: DC | PRN
  Administered 2024-03-13 (×4): 25 ug via INTRAVENOUS

## 2024-03-13 MED ORDER — PHENYLEPHRINE 80 MCG/ML (10ML) SYRINGE FOR IV PUSH (FOR BLOOD PRESSURE SUPPORT)
160.0000 ug | PREFILLED_SYRINGE | Freq: Once | INTRAVENOUS | Status: AC
  Administered 2024-03-13: 160 ug via INTRAVENOUS

## 2024-03-13 MED ORDER — HYDROXYZINE HCL 25 MG PO TABS
50.0000 mg | ORAL_TABLET | Freq: Every evening | ORAL | Status: DC | PRN
  Administered 2024-03-13 – 2024-03-18 (×6): 50 mg via ORAL
  Filled 2024-03-13 (×6): qty 2

## 2024-03-13 MED ORDER — ACETAMINOPHEN 500 MG PO TABS
1000.0000 mg | ORAL_TABLET | Freq: Four times a day (QID) | ORAL | Status: AC
  Administered 2024-03-13 – 2024-03-14 (×4): 1000 mg via ORAL
  Filled 2024-03-13 (×3): qty 2

## 2024-03-13 MED ORDER — ACETAMINOPHEN 10 MG/ML IV SOLN: 1000.0000 mg | Freq: Once | INTRAVENOUS | Status: DC | PRN

## 2024-03-13 MED ORDER — CHLORHEXIDINE GLUCONATE 0.12 % MT SOLN
OROMUCOSAL | Status: AC
  Filled 2024-03-13: qty 15

## 2024-03-13 MED ORDER — PROPOFOL 10 MG/ML IV BOLUS
INTRAVENOUS | Status: AC
  Filled 2024-03-13: qty 20

## 2024-03-13 MED ORDER — ACETAMINOPHEN 10 MG/ML IV SOLN
INTRAVENOUS | Status: AC
  Filled 2024-03-13: qty 100

## 2024-03-13 MED ORDER — OXYCODONE HCL 5 MG PO TABS
5.0000 mg | ORAL_TABLET | Freq: Once | ORAL | Status: AC | PRN
  Administered 2024-03-13: 5 mg via ORAL

## 2024-03-13 MED ORDER — BUPIVACAINE HCL (PF) 0.5 % IJ SOLN
INTRAMUSCULAR | Status: AC
  Filled 2024-03-13: qty 30

## 2024-03-13 MED ORDER — ONDANSETRON HCL 4 MG/2ML IJ SOLN: 4.0000 mg | Freq: Once | INTRAMUSCULAR | Status: DC | PRN

## 2024-03-13 MED ORDER — DEXAMETHASONE SODIUM PHOSPHATE 10 MG/ML IJ SOLN
INTRAMUSCULAR | Status: DC | PRN
  Administered 2024-03-13: 5 mg via INTRAVENOUS

## 2024-03-13 MED ORDER — OXYCODONE HCL 5 MG PO TABS
5.0000 mg | ORAL_TABLET | ORAL | Status: DC | PRN
  Administered 2024-03-13 – 2024-03-19 (×16): 10 mg via ORAL
  Filled 2024-03-13 (×18): qty 2

## 2024-03-13 MED ORDER — INSULIN ASPART 100 UNIT/ML IJ SOLN
0.0000 [IU] | Freq: Three times a day (TID) | INTRAMUSCULAR | Status: DC
  Administered 2024-03-13: 11 [IU] via SUBCUTANEOUS
  Administered 2024-03-14 (×3): 3 [IU] via SUBCUTANEOUS
  Administered 2024-03-15 – 2024-03-16 (×3): 2 [IU] via SUBCUTANEOUS
  Administered 2024-03-17: 3 [IU] via SUBCUTANEOUS
  Administered 2024-03-17: 2 [IU] via SUBCUTANEOUS
  Filled 2024-03-13 (×10): qty 1

## 2024-03-13 MED ORDER — PHENYLEPHRINE HCL-NACL 20-0.9 MG/250ML-% IV SOLN
INTRAVENOUS | Status: DC | PRN
  Administered 2024-03-13: 30 ug/min via INTRAVENOUS

## 2024-03-13 SURGICAL SUPPLY — 40 items
BLADE SAGITTAL WIDE XTHICK NO (BLADE) ×1 IMPLANT
BLADE SURG SZ10 CARB STEEL (BLADE) ×1 IMPLANT
BNDG COHESIVE 4X5 TAN STRL LF (GAUZE/BANDAGES/DRESSINGS) ×1 IMPLANT
BNDG ELASTIC 4X5.8 VLCR NS LF (GAUZE/BANDAGES/DRESSINGS) ×1 IMPLANT
BNDG GAUZE DERMACEA FLUFF 4 (GAUZE/BANDAGES/DRESSINGS) ×1 IMPLANT
BRUSH SCRUB EZ 4% CHG (MISCELLANEOUS) ×1 IMPLANT
CHLORAPREP W/TINT 26 (MISCELLANEOUS) IMPLANT
DRAPE INCISE IOBAN 66X45 STRL (DRAPES) ×1 IMPLANT
DRSG GAUZE FLUFF 36X18 (GAUZE/BANDAGES/DRESSINGS) ×1 IMPLANT
DRSG XEROFORM 1X8 (GAUZE/BANDAGES/DRESSINGS) IMPLANT
ELECT CAUTERY BLADE 6.4 (BLADE) ×1 IMPLANT
ELECTRODE REM PT RTRN 9FT ADLT (ELECTROSURGICAL) ×1 IMPLANT
GAUZE SPONGE 4X4 12PLY STRL (GAUZE/BANDAGES/DRESSINGS) IMPLANT
GLOVE BIO SURGEON STRL SZ7 (GLOVE) ×2 IMPLANT
GLOVE INDICATOR 7.5 STRL GRN (GLOVE) ×1 IMPLANT
GLOVE SURG SYN 8.0 (GLOVE) ×1 IMPLANT
GLOVE SURG SYN 8.0 PF PI (GLOVE) ×1 IMPLANT
GOWN STRL REUS W/ TWL LRG LVL3 (GOWN DISPOSABLE) ×2 IMPLANT
GOWN STRL REUS W/ TWL XL LVL3 (GOWN DISPOSABLE) ×1 IMPLANT
HANDLE YANKAUER SUCT BULB TIP (MISCELLANEOUS) ×1 IMPLANT
KIT TURNOVER KIT A (KITS) ×1 IMPLANT
LABEL OR SOLS (LABEL) ×1 IMPLANT
MANIFOLD NEPTUNE II (INSTRUMENTS) ×1 IMPLANT
MAT ABSORB FLUID 56X50 GRAY (MISCELLANEOUS) ×1 IMPLANT
PACK EXTREMITY ARMC (MISCELLANEOUS) ×1 IMPLANT
PAD ABD DERMACEA PRESS 5X9 (GAUZE/BANDAGES/DRESSINGS) ×1 IMPLANT
PAD PREP OB/GYN DISP 24X41 (PERSONAL CARE ITEMS) ×1 IMPLANT
PENCIL SMOKE EVACUATOR (MISCELLANEOUS) ×1 IMPLANT
SPONGE T-LAP 18X18 ~~LOC~~+RFID (SPONGE) ×2 IMPLANT
STAPLER SKIN PROX 35W (STAPLE) IMPLANT
STOCKINETTE M/LG 89821 (MISCELLANEOUS) ×1 IMPLANT
SUT SILK 2-0 18XBRD TIE 12 (SUTURE) ×1 IMPLANT
SUT SILK 3-0 18XBRD TIE 12 (SUTURE) ×1 IMPLANT
SUT VIC AB 0 CT1 36 (SUTURE) ×4 IMPLANT
SUT VIC AB 3-0 SH 27X BRD (SUTURE) ×2 IMPLANT
SUT VICRYL PLUS ABS 0 54 (SUTURE) ×1 IMPLANT
SYR 20ML LL LF (SYRINGE) ×2 IMPLANT
TAPE UMBILICAL 1/8 X36 TWILL (MISCELLANEOUS) ×1 IMPLANT
TOWEL OR 17X26 4PK STRL BLUE (TOWEL DISPOSABLE) ×2 IMPLANT
TRAP FLUID SMOKE EVACUATOR (MISCELLANEOUS) ×1 IMPLANT

## 2024-03-13 NOTE — Progress Notes (Signed)
 Bedside report received from [place], PACU RN. Patient admitted to room 223 from PACU s/p Left below the knee amputation.  VSS and comfortable at this time, calling for dinner. Patient settled in room with mother at bedside, supportive. Will continue to monitor with POC.

## 2024-03-13 NOTE — Op Note (Signed)
   OPERATIVE NOTE   PROCEDURE: Left below-the-knee amputation  PRE-OPERATIVE DIAGNOSIS: Left foot gangrene chronic osteomyelitis  POST-OPERATIVE DIAGNOSIS: same as above  SURGEON: [name], MD  ASSISTANT(S): Penne Bowl, NP  ANESTHESIA: general  ESTIMATED BLOOD LOSS: 100 cc  FINDING(S): Healthy appearing tissues  SPECIMEN(S):  Left below-the-knee amputation  INDICATIONS:   [name] is a 48 y.o. female who presents with left leg chronic osteomyelitis with a nonhealing wound.  The patient is scheduled for a left below-the-knee amputation.  I discussed in depth with the patient the risks, benefits, and alternatives to this procedure.  The patient is aware that the risk of this operation included but are not limited to:  bleeding, infection, myocardial infarction, stroke, death, failure to heal amputation wound, and possible need for more proximal amputation.  The patient is aware of the risks and agrees proceed forward with the procedure.  DESCRIPTION:  After full informed written consent was obtained from the patient, the patient was brought back to the operating room, and placed supine upon the operating table.  Prior to induction, the patient received IV antibiotics.  The patient was then prepped and draped in the standard fashion for a below-the-knee amputation.  After obtaining adequate anesthesia, the patient was prepped and draped in the standard fashion for a left below-the-knee amputation.  I marked out the anterior incision two finger breadths below the tibial tuberosity and then the marked out a posterior flap that was one third of the circumference of the calf in length.   I made the incisions for these flaps, and then dissected through the subcutaneous tissue, fascia, and muscle anteriorly.  I elevated  the periosteal tissue superiorly so that the tibia was about 3-4 cm shorter than the anterior skin flap.  I then transected the tibia with a power saw and then took  a wedge off the tibia anteriorly with the power saw.  Then I smoothed out the rough edges.  In a similar fashion, I cut back the fibula about two centimeters higher than the level of the tibia with a bone cutter.  I put a bone hook into the distal tibia and then used a large amputation knife to sharply develop a tissue plane through the muscle along the fibula.  In such fashion, the posterior flap was developed.  At this point, the specimen was passed off the field as the below-the-knee amputation.  At this point, I clamped all visibly bleeding arteries and veins using a combination of suture ligation with Silk suture and electrocautery.  Bleeding continued to be controlled with electrocautery and suture ligature.  The stump was washed off with sterile normal saline and no further active bleeding was noted.  I reapproximated the anterior and posterior fascia  with interrupted stitches of 0 Vicryl.  This was completed along the entire length of anterior and posterior fascia until there were no more loose space in the fascial line. I then placed a layer of 2-0 Vicryl sutures in the subcutaneous tissue. The skin was then  reapproximated with staples.  The stump was washed off and dried.  The incision was dressed with Xeroform and  then fluffs were applied.  Kerlix was wrapped around the leg and then gently an ACE wrap was applied.    COMPLICATIONS: none  CONDITION: stable   [name]  03/13/2024, 12:35 PM    This note was created with Dragon Medical transcription system. Any errors in dictation are purely unintentional.

## 2024-03-13 NOTE — Anesthesia Preprocedure Evaluation (Addendum)
 Anesthesia Evaluation  Patient identified by MRN, date of birth, ID band Patient awake    Reviewed: Allergy & Precautions, NPO status , Patient's Chart, lab work & pertinent test results  History of Anesthesia Complications Negative for: history of anesthetic complications  Airway Mallampati: II  TM Distance: >3 FB Neck ROM: Full    Dental  (+) Edentulous Upper, Edentulous Lower   Pulmonary shortness of breath, neg sleep apnea, COPD, Current SmokerPatient did not abstain from smoking.   Pulmonary exam normal breath sounds clear to auscultation       Cardiovascular Exercise Tolerance: Good METShypertension, Pt. 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  Patient endorses ability to achieve METS > 4.   Neuro/Psych Seizures -, Well Controlled,  PSYCHIATRIC DISORDERS Anxiety Depression Bipolar Disorder   CVA    GI/Hepatic ,GERD  Medicated,,(+)     substance abuse  marijuana usePatient last smoked marijuana two hours prior (this is in line with ASA recommendations of minimum cessation time prior to elective anesthesia). She denies intentional use of other recreational drugs. She does not appear acutely intoxicated.   Endo/Other  neg diabetes    Renal/GU ESRF and DialysisRenal diseaseDialyzed yesterday. K+ wnl today     Musculoskeletal   Abdominal   Peds  Hematology  (+) Blood dyscrasia, anemia   Anesthesia Other Findings Past Medical History: No date: Anemia No date: Anxiety No date: Bipolar disorder (HCC) No date: Cervical cancer (HCC) No date: Chronic combined systolic and diastolic CHF (congestive  heart failure) (HCC)      Comment:  a. ECHO 04/23/14 LV EF: 45% - 50%,  mild LVH, diffuse               hypokinesis. LV diastolic dysfunction, high ventricular               filling pressure. Mild LA dilation. No date: Cocaine abuse (HCC) No date: Complication of anesthesia     Comment:  a.) delayed/prolonged emergence No date: COPD (chronic obstructive pulmonary disease) (HCC) No date: Deep vein thrombosis (DVT) of popliteal vein of left lower  extremity (HCC) No date: Depression No date: Diabetes mellitus without complication (HCC) No date: Diarrhea No date: Dyspnea No date: ESRD on hemodialysis (HCC) No date: GERD (gastroesophageal reflux disease) 10/2018: Heroin overdose (HCC) No date: History of kidney stones No date: Hypertension 02/18/2024: Intractable nausea and vomiting No date: Mitral stenosis No date: MRSA infection     Comment:  arm wound No date: Nicotine  dependence with current use No date: Pneumonia No date: Seizures (HCC)     Comment:  last seizure March 2021; usual 1-2 per month No date: Stroke [facility])     Comment:  mini stroke when on drugs  Reproductive/Obstetrics                             Anesthesia Physical Anesthesia Plan  ASA: 4  Anesthesia Plan: General   Post-op Pain Management: Ofirmev  IV (intra-op)* and Ketamine IV*   Induction: Intravenous  PONV Risk Score and Plan: 3 and Ondansetron , Dexamethasone  and Midazolam   Airway Management Planned: Oral ETT and Video Laryngoscope Planned  Additional Equipment: None  Intra-op Plan:   Post-operative Plan: Extubation in  OR  Informed Consent: I have reviewed the patients History and Physical, chart, labs and discussed the procedure including the risks, benefits and alternatives for the proposed anesthesia with the patient or authorized representative who has indicated his/her understanding and acceptance.     Dental advisory given  Plan Discussed with: CRNA and Surgeon  Anesthesia Plan Comments:  (Discussed risks of anesthesia with patient, including PONV, sore throat, lip/dental/eye damage. Rare risks discussed as well, such as cardiorespiratory and neurological sequelae, and allergic reactions. Discussed the role of CRNA in patient's perioperative care. Patient understands. Patient counseled on being higher risk for anesthesia due to comorbidities: ESRD, HFrEF, pulmonary HTN. Patient was told about increased risk of cardiac and respiratory events, including death. Patient counseled on benefits of smoking cessation, and increased perioperative risks associated with continued smoking. )       Anesthesia Quick Evaluation

## 2024-03-13 NOTE — Transfer of Care (Signed)
 Immediate Anesthesia Transfer of Care Note  Patient: [name]  Procedure(s) Performed: Procedure(s): AMPUTATION BELOW KNEE (Left)  Patient Location: PACU  Anesthesia Type:General  Level of Consciousness: sedated  Airway & Oxygen Therapy: Patient Spontanous Breathing and Patient connected to face mask oxygen  Post-op Assessment: Report given to RN and Post -op Vital signs reviewed and stable  Post vital signs: Reviewed and stable  Last Vitals:  Vitals:   03/13/24 0933 03/13/24 0936  BP: (!) 95/33 (!) 126/54  Pulse: 81 77  Resp: 18 18  Temp:    SpO2: 100% 100%    Complications: No apparent anesthesia complications

## 2024-03-13 NOTE — Anesthesia Postprocedure Evaluation (Signed)
 Anesthesia Post Note  Patient: [name]  Procedure(s) Performed: AMPUTATION BELOW KNEE (Left: Knee)  Patient location during evaluation: PACU Anesthesia Type: General Level of consciousness: awake and alert, oriented and patient cooperative Pain management: pain level controlled Vital Signs Assessment: post-procedure vital signs reviewed and stable Respiratory status: spontaneous breathing, nonlabored ventilation and respiratory function stable Cardiovascular status: blood pressure returned to baseline and stable (Pt slightly hypotensive compared to baseline; treated in PACU successfully) Postop Assessment: adequate PO intake Anesthetic complications: no   No notable events documented.   Last Vitals:  Vitals:   03/13/24 1145 03/13/24 1200  BP: 113/65 107/64  Pulse: 76 76  Resp: 12 12  Temp:    SpO2: 100% 100%    Last Pain:  Vitals:   03/13/24 1200  TempSrc:   PainSc: Asleep                 Dorothey Gate

## 2024-03-13 NOTE — Interval H&P Note (Signed)
 History and Physical Interval Note:  03/13/2024 7:14 AM  [name]  has presented today for surgery, with the diagnosis of OSTEOMYELITITS OF FOOT.  The various methods of treatment have been discussed with the patient and family. After consideration of risks, benefits and other options for treatment, the patient has consented to  Procedure(s): AMPUTATION BELOW KNEE (Left) as a surgical intervention.  The patient's history has been reviewed, patient examined, no change in status, stable for surgery.  I have reviewed the patient's chart and labs.  Questions were answered to the patient's satisfaction.     [name]

## 2024-03-13 NOTE — Anesthesia Procedure Notes (Signed)
 Procedure Name: Intubation Date/Time: 03/13/2024 7:44 AM  Performed by: Racheal Buddle, CRNAPre-anesthesia Checklist: Patient identified, Patient being monitored, Timeout performed, Emergency Drugs available and Suction available Patient Re-evaluated:Patient Re-evaluated prior to induction Oxygen Delivery Method: Circle system utilized Preoxygenation: Pre-oxygenation with 100% oxygen Induction Type: IV induction Ventilation: Mask ventilation without difficulty Laryngoscope Size: Mac and 3 Grade View: Grade I Tube type: Oral Tube size: 6.5 mm Number of attempts: 1 Airway Equipment and Method: Stylet Placement Confirmation: ETT inserted through vocal cords under direct vision, positive ETCO2 and breath sounds checked- equal and bilateral Secured at: 20 cm Tube secured with: Tape Dental Injury: Teeth and Oropharynx as per pre-operative assessment

## 2024-03-14 ENCOUNTER — Encounter: Payer: Self-pay | Admitting: Vascular Surgery

## 2024-03-14 LAB — GLUCOSE, CAPILLARY
Glucose-Capillary: 181 mg/dL — ABNORMAL HIGH (ref 70–99)
Glucose-Capillary: 152 mg/dL — ABNORMAL HIGH (ref 70–99)
Glucose-Capillary: 176 mg/dL — ABNORMAL HIGH (ref 70–99)
Glucose-Capillary: 143 mg/dL — ABNORMAL HIGH (ref 70–99)

## 2024-03-14 LAB — SURGICAL PATHOLOGY

## 2024-03-14 MED ORDER — ONDANSETRON HCL 4 MG/2ML IJ SOLN
4.0000 mg | Freq: Four times a day (QID) | INTRAMUSCULAR | Status: DC | PRN
  Administered 2024-03-15 – 2024-03-17 (×3): 4 mg via INTRAVENOUS
  Filled 2024-03-14 (×3): qty 2

## 2024-03-14 MED ORDER — PHENOL 1.4 % MT LIQD: 1.0000 | OROMUCOSAL | Status: DC | PRN

## 2024-03-14 MED ORDER — SORBITOL 70 % SOLN: 30.0000 mL | Freq: Every day | Status: DC | PRN

## 2024-03-14 MED ORDER — HYDRALAZINE HCL 20 MG/ML IJ SOLN: 5.0000 mg | INTRAMUSCULAR | Status: DC | PRN

## 2024-03-14 MED ORDER — FAMOTIDINE IN NACL 20-0.9 MG/50ML-% IV SOLN
20.0000 mg | Freq: Two times a day (BID) | INTRAVENOUS | Status: DC
  Administered 2024-03-14 (×2): 20 mg via INTRAVENOUS
  Filled 2024-03-14 (×3): qty 50

## 2024-03-14 MED ORDER — POLYETHYLENE GLYCOL 3350 17 G PO PACK: 17.0000 g | PACK | Freq: Every day | ORAL | Status: DC | PRN

## 2024-03-14 MED ORDER — ACETAMINOPHEN 325 MG PO TABS
325.0000 mg | ORAL_TABLET | Freq: Four times a day (QID) | ORAL | Status: DC | PRN
  Administered 2024-03-18: 650 mg via ORAL
  Filled 2024-03-14 (×2): qty 2

## 2024-03-14 MED ORDER — SODIUM CHLORIDE 0.9 % IV SOLN: 250.0000 mL | INTRAVENOUS | Status: AC | PRN

## 2024-03-14 MED ORDER — SODIUM CHLORIDE 0.9% FLUSH
3.0000 mL | Freq: Two times a day (BID) | INTRAVENOUS | Status: DC
  Administered 2024-03-14 – 2024-03-19 (×11): 3 mL via INTRAVENOUS

## 2024-03-14 MED ORDER — ALUM & MAG HYDROXIDE-SIMETH 200-200-20 MG/5ML PO SUSP: 15.0000 mL | ORAL | Status: DC | PRN

## 2024-03-14 MED ORDER — METOPROLOL TARTRATE 5 MG/5ML IV SOLN: 2.0000 mg | INTRAVENOUS | Status: DC | PRN

## 2024-03-14 MED ORDER — LABETALOL HCL 5 MG/ML IV SOLN: 10.0000 mg | INTRAVENOUS | Status: DC | PRN

## 2024-03-14 MED ORDER — GUAIFENESIN-DM 100-10 MG/5ML PO SYRP: 15.0000 mL | ORAL_SOLUTION | ORAL | Status: DC | PRN

## 2024-03-14 MED ORDER — MAGNESIUM SULFATE 2 GM/50ML IV SOLN: 2.0000 g | Freq: Every day | INTRAVENOUS | Status: DC | PRN

## 2024-03-14 MED ORDER — SODIUM CHLORIDE 0.9% FLUSH: 3.0000 mL | INTRAVENOUS | Status: DC | PRN

## 2024-03-14 MED ORDER — HYDROMORPHONE HCL 1 MG/ML IJ SOLN
0.5000 mg | INTRAMUSCULAR | Status: DC | PRN
  Administered 2024-03-14 – 2024-03-15 (×4): 1 mg via INTRAVENOUS
  Filled 2024-03-14 (×4): qty 1

## 2024-03-14 MED ORDER — HYDROMORPHONE HCL 2 MG PO TABS
2.0000 mg | ORAL_TABLET | ORAL | Status: DC | PRN
  Administered 2024-03-14: 3 mg via ORAL
  Administered 2024-03-14 – 2024-03-16 (×4): 2 mg via ORAL
  Administered 2024-03-16 – 2024-03-17 (×4): 3 mg via ORAL
  Administered 2024-03-17: 2 mg via ORAL
  Administered 2024-03-17: 3 mg via ORAL
  Administered 2024-03-18: 2 mg via ORAL
  Filled 2024-03-14 (×4): qty 2
  Filled 2024-03-14 (×3): qty 1
  Filled 2024-03-14 (×2): qty 2
  Filled 2024-03-14 (×3): qty 1

## 2024-03-14 MED ORDER — HEPARIN SODIUM (PORCINE) 5000 UNIT/ML IJ SOLN
5000.0000 [IU] | Freq: Three times a day (TID) | INTRAMUSCULAR | Status: DC
  Administered 2024-03-14 – 2024-03-19 (×15): 5000 [IU] via SUBCUTANEOUS
  Filled 2024-03-14 (×14): qty 1

## 2024-03-14 MED ORDER — DOCUSATE SODIUM 100 MG PO CAPS
100.0000 mg | ORAL_CAPSULE | Freq: Every day | ORAL | Status: DC
  Administered 2024-03-15 – 2024-03-18 (×4): 100 mg via ORAL
  Filled 2024-03-14 (×5): qty 1

## 2024-03-14 NOTE — Progress Notes (Signed)
  Progress Note    03/14/2024 10:21 AM 1 Day Post-Op  Subjective:  [name] is a 48 yo female now POD #1 from left below the knee amputation.  Patient is resting comfortably sitting in a bedside chair this morning.  Patient endorses she would not like to go to rehab but would like to have home health services if available depending upon insurance.  No complaints overnight.  Endorses pain is tolerable.  Vitals all remained stable.   Vitals:   03/14/24 0807 03/14/24 0830  BP: 94/67 107/86  Pulse: (!) 148 74  Resp: 17   Temp:    SpO2: 100% 100%   Physical Exam: Cardiac:  RRR, normal S1 and S2.  No rubs clicks gallops or murmurs noted. Lungs: Clear throughout on auscultation, no rales rhonchi or wheezing noted. Incisions: Left lower extremity BKA.  Dressings clean dry and intact.  No signs or symptoms of infection. Extremities: Left lower extremity BKA.  Right lower extremity warm to touch with palpable pulses. Abdomen: Positive bowel sounds throughout, soft, nontender and nondistended. Neurologic: Alert and oriented x 3, answers all questions and follows commands appropriately.  CBC    Component Value Date/Time   WBC 12.0 (H) 03/13/2024 1032   RBC 2.78 (L) 03/13/2024 1032   HGB 9.1 (L) 03/13/2024 1032   HCT 27.1 (L) 03/13/2024 1032   PLT 331 03/13/2024 1032   MCV 97.5 03/13/2024 1032   MCH 32.7 03/13/2024 1032   MCHC 33.6 03/13/2024 1032   RDW 14.4 03/13/2024 1032   LYMPHSABS 1.1 03/06/2024 1347   MONOABS 0.5 03/06/2024 1347   EOSABS 0.1 03/06/2024 1347   BASOSABS 0.1 03/06/2024 1347    BMET    Component Value Date/Time   NA 137 03/13/2024 0648   K 3.6 03/13/2024 0648   CL 98 03/13/2024 0648   CO2 26 03/06/2024 1347   GLUCOSE 114 (H) 03/13/2024 0648   BUN 25 (H) 03/13/2024 0648   CREATININE 2.85 (H) 03/13/2024 1032   CREATININE 1.54 (H) 02/26/2019 1139   CALCIUM  9.4 03/06/2024 1347   GFRNONAA 20 (L) 03/13/2024 1032   GFRAA 52 (L) 04/02/2020 0457    INR     Component Value Date/Time   INR 1.7 (H) 01/24/2024 1222     Intake/Output Summary (Last 24 hours) at 03/14/2024 1021 Last data filed at 03/13/2024 2000 Gross per 24 hour  Intake 150 ml  Output 350 ml  Net -200 ml     Assessment/Plan:  48 y.o. female is s/p left below the knee amputation.  1 Day Post-Op   PLAN Vascular surgery will do first dressing change tomorrow on 03/15/2024. Okay per vascular surgery for patient to discharge to home once initial dressing change completed.  Currently working on trying to obtain home health physical therapy and nursing for the patient.  DVT prophylaxis: Heparin  5000 units subcu every 8 hours.   [name] [facility] 03/14/2024 10:21 AM

## 2024-03-14 NOTE — Evaluation (Signed)
 Occupational Therapy Evaluation Patient Details Name: [name] MRN: [id number] DOB: 1976-01-29 Today's Date: 03/14/2024   History of Present Illness   [name]  has presented today for surgery, with the diagnosis of L foot osteomyelitis s/p L BKA on 03/13/24.     Clinical Impressions Pt was seen for OT evaluation this date. PTA, pt reports ambulating and performing ADLs independently and living with mother. She plans to live with aunt on discharge and has needed DME. Pt presents to acute OT demonstrating impaired ADL performance and functional mobility 2/2 pain, weakness, balance deficits. Pt reporting 5/10 pain at incision site after medication. On entry to room, pt actively vomiting in the floor. OT providing a towel and getting emesis basin/bag as well as notifying nurse and NT. Assisted with clean up and pt wishing to return to bed from recliner. Performed face washing with set up in seated position, deferred mouthwash at this time. Pt attempting to stand without RW as she was hoping to slide/lateral scoot transfer. Edu on importance of therapy maximize her ability to stand and progress to hopping if able/tolerable in prep for prosthetic use once incision heals. STS from recliner to RW and SPT back to with CGA, cueing for safety, pacing, proximity to walker. Pt able to reach towards feet in long sitting and likely CGA for ADLs at this time. Per NT, she did not need physical assist to bathe/dress herself prior to OT arrival. Pt would benefit from skilled OT services to address noted impairments and functional limitations to maximize safety and independence while minimizing falls risk and caregiver burden. Do anticipate the need for follow up OT services upon acute hospital DC.      If plan is discharge home, recommend the following:   A little help with walking and/or transfers;A little help with bathing/dressing/bathroom;Assistance with cooking/housework;Help with stairs or ramp  for entrance     Functional Status Assessment   Patient has had a recent decline in their functional status and demonstrates the ability to make significant improvements in function in a reasonable and predictable amount of time.     Equipment Recommendations   None recommended by OT (has needed DME)     Recommendations for Other Services         Precautions/Restrictions   Precautions Precautions: Fall Restrictions Weight Bearing Restrictions Per Provider Order: Yes LLE Weight Bearing Per Provider Order: Non weight bearing Other Position/Activity Restrictions: L BKA     Mobility Bed Mobility Overal bed mobility: Independent             General bed mobility comments: return to supine with IND; able to long sit in bed independently for simualted ADLs/ pulling up blankets, etc.    Transfers Overall transfer level: Needs assistance Equipment used: Rolling walker (2 wheels) Transfers: Sit to/from Stand, Bed to chair/wheelchair/BSC Sit to Stand: Contact guard assist Stand pivot transfers: Contact guard assist         General transfer comment: verb cues for placing, safety, pt impulsive, able to pivot on RLE; had vomited a lot on OT entry to room requiring clean up prior to transfer      Balance Overall balance assessment: Needs assistance Sitting-balance support: Feet supported Sitting balance-Leahy Scale: Normal     Standing balance support: Bilateral upper extremity supported, Reliant on assistive device for balance Standing balance-Leahy Scale: Fair Standing balance comment: reliant on RW for support during transfer/standing; CGA for transfer  ADL either performed or assessed with clinical judgement   ADL Overall ADL's : Needs assistance/impaired     Grooming: Wash/dry face;Sitting;Set up                                 General ADL Comments: simulated ADLs requiring CGA for safety overall-per NT  pt performed her own bathing/dressing with supervision from her     Vision         Perception         Praxis         Pertinent Vitals/Pain Pain Assessment Pain Assessment: 0-10 Pain Score: 5  Pain Location: L lower limb at surgical site Pain Descriptors / Indicators: Cramping, Squeezing, Tightness, Throbbing Pain Intervention(s): Limited activity within patient's tolerance, Monitored during session, Premedicated before session, Repositioned     Extremity/Trunk Assessment Upper Extremity Assessment Upper Extremity Assessment: Overall WFL for tasks assessed   Lower Extremity Assessment Lower Extremity Assessment: Overall WFL for tasks assessed;LLE deficits/detail LLE Deficits / Details: L BKA   Cervical / Trunk Assessment Cervical / Trunk Assessment: Normal   Communication     Cognition Arousal: Alert Behavior During Therapy: WFL for tasks assessed/performed                                 Following commands: Intact       Cueing  General Comments   Cueing Techniques: Verbal cues  vomiting a lot on entry to room-nurse and NT notified   Exercises Other Exercises Other Exercises: Edu on role of OT in acute setting, importance of getting up/OOB daily, elevating for edema management, and overall safety during ADLs and transfers   Shoulder Instructions      Home Living Family/patient expects to be discharged to:: Private residence Living Arrangements: Parent Available Help at Discharge: Family;Available 24 hours/day Type of Home: House Home Access: Ramped entrance     Home Layout: One level     Bathroom Shower/Tub: Chief Strategy Officer: Standard Bathroom Accessibility: Yes   Home Equipment: Agricultural consultant (2 wheels);Wheelchair - manual;BSC/3in1;Shower seat          Prior Functioning/Environment Prior Level of Function : Independent/Modified Independent                    OT Problem List: Impaired balance  (sitting and/or standing);Decreased safety awareness   OT Treatment/Interventions: Self-care/ADL training;Therapeutic activities;DME and/or AE instruction;Balance training;Therapeutic exercise;Patient/family education      OT Goals(Current goals can be found in the care plan section)   Acute Rehab OT Goals Patient Stated Goal: improve function and pain OT Goal Formulation: With patient Time For Goal Achievement: 03/28/24 Potential to Achieve Goals: Good ADL Goals Pt Will Perform Lower Body Bathing: with modified independence;with supervision;sitting/lateral leans;sit to/from stand Pt Will Perform Lower Body Dressing: with modified independence;with supervision;sit to/from stand;sitting/lateral leans Pt Will Transfer to Toilet: with supervision;bedside commode;stand pivot transfer Pt Will Perform Toileting - Clothing Manipulation and hygiene: with supervision;sitting/lateral leans   OT Frequency:  Min 2X/week    Co-evaluation              AM-PAC OT "6 Clicks" Daily Activity     Outcome Measure Help from another person eating meals?: None Help from another person taking care of personal grooming?: None Help from another person toileting, which includes using toliet, bedpan, or urinal?: A  Little Help from another person bathing (including washing, rinsing, drying)?: A Little Help from another person to put on and taking off regular upper body clothing?: None Help from another person to put on and taking off regular lower body clothing?: A Little 6 Click Score: 21   End of Session Equipment Utilized During Treatment: Rolling walker (2 wheels) Nurse Communication: Mobility status  Activity Tolerance: Patient tolerated treatment well Patient left: in bed;with call bell/phone within reach;with bed alarm set  OT Visit Diagnosis: Other abnormalities of gait and mobility (R26.89)                Time: 1340-1401 OT Time Calculation (min): 21 min Charges:  OT General Charges $OT  Visit: 1 Visit OT Evaluation $OT Eval Low Complexity: 1 Low OT Treatments $Self Care/Home Management : 8-22 mins [name], OTR/L 03/14/24, 3:24 PM  [name] 03/14/2024, 3:16 PM

## 2024-03-14 NOTE — Evaluation (Signed)
 Physical Therapy Evaluation Patient Details Name: FRANKIE ZITO MRN: [id number] DOB: Nov 21, 1975 Today's Date: 03/14/2024  History of Present Illness  Seda A Kumpf  has presented today for surgery, with the diagnosis of OSTEOMYELITITS OF FOOT. 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  Clinical Impression  Pt admitted with above diagnosis. Pt currently with functional limitations due to the deficits listed below (see PT Problem List). Pt received supine in bed agreeable to PT with encouragement. Reports PTA ambulating independently and plans to live with her mother until she can get an apartment. Reports pain currently 6-7/10 NPS post being pre medicated.   To date, pt moves independently with bed mobility. Needs VC's for hand placement to RW and stands at [facility]. Able to SPT to recliner needing VC's for keeping body in RW and pivoting on R foot or performing small hops. Pt declining gait efforts due to her pain and lack of sleep. Once in recliner and LLE elevated, time spent on education as listed below as it pertains to L BKA. Pt understanding. Anticipate pt will be appropriate for d/c venue listed due to current mobility levels and current DME. Pt will benefit from skilled PT services to address acute mobility deficits. Pt with all needs in reach in recliner.      If plan is discharge home, recommend the following: A little help with walking and/or transfers;A little help with bathing/dressing/bathroom;Assist for transportation;Help with stairs or ramp for entrance;Assistance with cooking/housework   Can travel by private vehicle        Equipment Recommendations Other (comment) (tub bench)  Recommendations for Other Services       Functional Status Assessment Patient has had a recent decline in their functional status and demonstrates the ability to make significant improvements in function in a reasonable and predictable amount of time.     Precautions / Restrictions Precautions Precautions:  Fall Restrictions Weight Bearing Restrictions Per Provider Order: Yes LLE Weight Bearing Per Provider Order: Non weight bearing Other Position/Activity Restrictions: L BKA      Mobility  Bed Mobility Overal bed mobility: Needs Assistance Bed Mobility: Supine to Sit     Supine to sit: Independent       Patient Response: Cooperative  Transfers Overall transfer level: Needs assistance Equipment used: Rolling walker (2 wheels) Transfers: Sit to/from Stand, Bed to chair/wheelchair/BSC Sit to Stand: Contact guard assist   Step pivot transfers: Contact guard assist       General transfer comment: VC's keeping body inside RW BOS. pivoting on RLE    Ambulation/Gait               General Gait Details: Pt declining due to fatigue and pain  Stairs            Wheelchair Mobility     Tilt Bed Tilt Bed Patient Response: Cooperative  Modified Rankin (Stroke Patients Only)       Balance Overall balance assessment: Needs assistance Sitting-balance support: Feet supported Sitting balance-Leahy Scale: Normal       Standing balance-Leahy Scale: Fair Standing balance comment: reliant on AD due to BKA                             Pertinent Vitals/Pain Pain Assessment Pain Assessment: 0-10 Pain Score: 7  Pain Location: L lower limb at surgical site Pain Descriptors / Indicators: Cramping, Squeezing, Tightness, Throbbing Pain Intervention(s): Limited activity within patient's tolerance, Monitored during session, Premedicated before session  Home Living Family/patient expects to be discharged to:: Private residence Living Arrangements: Parent Available Help at Discharge: Family;Available 24 hours/day Type of Home: House Home Access: Ramped entrance       Home Layout: One level Home Equipment: Agricultural consultant (2 wheels);Wheelchair - manual;BSC/3in1;Shower seat      Prior Function Prior Level of Function : Independent/Modified Independent                      Extremity/Trunk Assessment   Upper Extremity Assessment Upper Extremity Assessment: Overall WFL for tasks assessed    Lower Extremity Assessment Lower Extremity Assessment: Overall WFL for tasks assessed;LLE deficits/detail LLE Deficits / Details: L BKA    Cervical / Trunk Assessment Cervical / Trunk Assessment: Normal  Communication        Cognition Arousal: Alert Behavior During Therapy: WFL for tasks assessed/performed   PT - Cognitive impairments: No apparent impairments                         Following commands: Intact       Cueing Cueing Techniques: Verbal cues     General Comments General comments (skin integrity, edema, etc.): bandaging intact    Exercises Other Exercises Other Exercises: Reviewed importance of knee flexibility/mobility to prevent contracture. Reviewed basic knee and hip AROM for joint and soft tissue mobility/integrity for pre prosthetic care. Importance of elevation for edema. Reviewed safe practices and RW use to prevent falls   Assessment/Plan    PT Assessment Patient needs continued PT services  PT Problem List Decreased strength;Decreased mobility;Decreased safety awareness;Decreased range of motion;Decreased activity tolerance;Decreased balance       PT Treatment Interventions DME instruction;Therapeutic exercise;Gait training;Balance training;Neuromuscular re-education;Functional mobility training;Therapeutic activities;Patient/family education    PT Goals (Current goals can be found in the Care Plan section)  Acute Rehab PT Goals Patient Stated Goal: improve her pain PT Goal Formulation: With patient Time For Goal Achievement: 03/28/24 Potential to Achieve Goals: Good    Frequency Min 3X/week     Co-evaluation               AM-PAC PT "6 Clicks" Mobility  Outcome Measure Help needed turning from your back to your side while in a flat bed without using bedrails?: None Help needed  moving from lying on your back to sitting on the side of a flat bed without using bedrails?: None Help needed moving to and from a bed to a chair (including a wheelchair)?: A Little Help needed standing up from a chair using your arms (e.g., wheelchair or bedside chair)?: A Little Help needed to walk in hospital room?: A Lot Help needed climbing 3-5 steps with a railing? : A Lot 6 Click Score: 18    End of Session Equipment Utilized During Treatment: Gait belt Activity Tolerance: Patient tolerated treatment well Patient left: in chair;with call bell/phone within reach;with chair alarm set Nurse Communication: Mobility status PT Visit Diagnosis: Other abnormalities of gait and mobility (R26.89);Muscle weakness (generalized) (M62.81);Difficulty in walking, not elsewhere classified (R26.2)    Time: 1478-2956 PT Time Calculation (min) (ACUTE ONLY): 19 min   Charges:   PT Evaluation $PT Eval Low Complexity: 1 Low   PT General Charges $$ ACUTE PT VISIT: 1 Visit        Marc Senior. Fairly IV, PT, DPT Physical Therapist- [place]   [facility]  03/14/2024, 11:30 AM

## 2024-03-14 NOTE — TOC Initial Note (Addendum)
 Transition of Care [facility]) - Initial/Assessment Note    Patient Details  Name: [name] MRN: [id number] Date of Birth: 1976-06-21  [facility] CM/SW Contact:    Odilia Bennett, LCSW Phone Number: 03/14/2024, 11:27 AM  Clinical Narrative:  Readmission prevention screen complete. CSW met with patient. No family at bedside. CSW introduced role and explained that discharge planning would be discussed. PCP is Darylene Epley, MD. Mom drives her to appointments. Pharmacy is [facility] in [place] [address] [place]. No issues obtaining medications. Patient lives with her mother and stepfather but plans to stay with her aunt at discharge. No home health prior to admission but she said PT is planning on recommending it. Note is pending. Aunt has bought her a shower chair, BSC, and wheelchair for use at her home. No further concerns. CSW will continue to follow patient for support and facilitate return home once stable. Mom will transport her at discharge.                1:34 pm: [facility], [facility], and [facility] are not in network with patient's insurance. [facility] is in network. CSW faxed referral to them to review. They said if they can accept referral, start of care would likely not be until later next week.  1:52 pm: [facility] is unable to accept referral.  4:08 pm: CSW updated patient. Discussed outpatient therapy and looked up clinics in [place]. [facility] shows up and patient is agreeable because this is close to where she lives. CSW notified patient that she and family will have to receive education on dressing changes.  Expected Discharge Plan: Home w Home Health Services Barriers to Discharge: Continued Medical Work up   Patient Goals and CMS Choice            Expected Discharge Plan and Services     Post Acute Care Choice: Home Health Living arrangements for the past 2 months: Single Family Home                                       Prior Living Arrangements/Services Living arrangements for the past 2 months: Single Family Home Lives with:: Parents Patient language and need for interpreter reviewed:: Yes Do you feel safe going back to the place where you live?: Yes      Need for Family Participation in Patient Care: Yes (Comment) Care giver support system in place?: Yes (comment)   Criminal Activity/Legal Involvement Pertinent to Current Situation/Hospitalization: No - Comment as needed  Activities of Daily Living   ADL Screening (condition at time of admission) Independently performs ADLs?: Yes (appropriate for developmental age) Is the patient deaf or have difficulty hearing?: No Does the patient have difficulty seeing, even when wearing glasses/contacts?: No Does the patient have difficulty concentrating, remembering, or making decisions?: No  Permission Sought/Granted Permission sought to share information with : Facility Industrial/product designer granted to share information with : Yes, Verbal Permission Granted     Permission granted to share info w AGENCY: Home health agencies        Emotional Assessment Appearance:: Appears stated age Attitude/Demeanor/Rapport: Engaged, Gracious Affect (typically observed): Accepting, Appropriate, Calm, Pleasant Orientation: : Oriented to Self, Oriented to Place, Oriented to  Time, Oriented to Situation Alcohol / Substance Use: Not Applicable Psych Involvement: No (comment)  Admission diagnosis:  Osteomyelitis of left  foot, unspecified type (HCC) [M86.9] Osteomyelitis of ankle and foot (HCC) [M86.9] Patient Active Problem List   Diagnosis Date Noted   Osteomyelitis of ankle and foot (HCC) 03/13/2024   Intractable nausea and vomiting 02/18/2024   Chronic ulcer of left foot (HCC) 02/18/2024   Hypokalemia 02/18/2024   History of DVT (deep vein thrombosis) 02/18/2024   ESRD on hemodialysis (HCC) 02/18/2024    Polysubstance abuse (HCC) 02/18/2024   GERD (gastroesophageal reflux disease) 02/18/2024   Cerebral thrombosis with cerebral infarction 01/08/2021   Metabolic encephalopathy 01/04/2021   Bacteremia due to Escherichia coli 01/04/2021   Acute metabolic encephalopathy 01/04/2021   PVD (peripheral vascular disease) (HCC)    Acute kidney injury superimposed on CKD (HCC)    Pressure ulcer of left foot    Diabetic polyneuropathy associated with type 2 diabetes mellitus (HCC)    Hyperlipidemia    Osteomyelitis of metatarsal (HCC) 09/30/2020   Hyperlipidemia associated with type 2 diabetes mellitus (HCC) 09/30/2020   Substance use disorder 09/30/2020   CKD stage 3 due to type 2 diabetes mellitus (HCC) 09/30/2020   History of alcohol abuse 08/11/2020   History of cannabis abuse 08/11/2020   History of cocaine abuse (HCC) 08/11/2020   Cellulitis of left foot    Pressure injury of skin 04/01/2020   Osteomyelitis of left foot (HCC) 03/30/2020   Nonrheumatic mitral valve stenosis 11/07/2019   Acute congestive heart failure (HCC) 10/25/2019   Osteomyelitis of fifth toe of left foot (HCC) 08/15/2019   History of amputation of left great toe (HCC) 06/27/2019   Risk for falls 06/25/2019   Bacteremia    Essential hypertension 03/21/2019   Chronic systolic CHF (congestive heart failure) (HCC) 03/21/2019   Toe osteomyelitis, left (HCC) 03/21/2019   Sepsis (HCC) 03/21/2019   Atherosclerosis of native arteries of the extremities with ulceration (HCC) 03/04/2019   Family discord 10/18/2018   Insomnia disorder, with non-sleep disorder mental comorbidity, persistent 10/18/2018   Ulcer of great toe, left, with fat layer exposed (HCC) 09/28/2018   Polycystic ovary syndrome 09/19/2018   Menometrorrhagia 06/29/2018   Post-traumatic osteoarthritis 01/10/2018   Secondary localized osteoarthrosis of ankle and foot 01/10/2018   Chronic foot pain, left 02/20/2017   Leg edema, left 02/20/2017   Renal mass of  unknown nature 05/13/2014   Urinary retention with incomplete bladder emptying 05/02/2014   Myopathy 04/28/2014   Psychogenic nonepileptic seizure 04/23/2014   Myositis 04/23/2014   Cocaine abuse (HCC) 04/23/2014   Bipolar disorder, unspecified (HCC) 04/23/2014   Morbid obesity (HCC) 04/23/2014   Diabetes mellitus type 2 with complications (HCC) 04/23/2014   Toxic encephalopathy 04/20/2014   Rhabdomyolysis 04/20/2014   Lactic acid acidosis 04/20/2014   Paraparesis of both lower limbs (HCC) 04/20/2014   HCAP (healthcare-associated pneumonia) 04/20/2014   Post-traumatic stress disorder, unspecified 03/20/2014   Severe cannabis use disorder (HCC) 03/20/2014   PCP:  Darylene Epley, MD Pharmacy:   [facility] [phone] - [place], [place] - [address] [place]  [address] Phone: [phone] Fax: [phone]  [facility] [address] [place] [address] Phone: [phone] Fax: [phone]     Social Drivers of Health (SDOH) Social History: SDOH Screenings   Food Insecurity: No Food Insecurity (03/13/2024)  Housing: Low Risk  (03/13/2024)  Transportation Needs: No Transportation Needs (03/13/2024)  Utilities: Not At Risk (03/13/2024)  Depression (PHQ2-9): Low Risk  (02/26/2019)  Tobacco Use: High Risk (03/13/2024)   SDOH Interventions:  Readmission Risk Interventions    03/14/2024   11:25 AM  Readmission Risk Prevention Plan  Transportation Screening Complete  PCP or Specialist Appt within 3-5 Days Complete  Social Work Consult for Recovery Care Planning/Counseling Complete  Palliative Care Screening Not Applicable  Medication Review Oceanographer) Complete

## 2024-03-14 NOTE — TOC CM/SW Note (Signed)
     [facility] REHABILITATION SERVICES REFERRAL        Occupational Therapy * Physical Therapy * Speech Therapy                           DATE 03/14/2024  PATIENT NAME [name] PATIENT MRN [id number]       DIAGNOSIS/DIAGNOSIS CODE Osteomyelitis of ankle and foot  DATE OF DISCHARGE: TBD       PRIMARY CARE PHYSICIAN  Darylene Epley, MD  PCP [facility] [phone]     Dear Provider (Name: [facility] Physical Therapy  Fax: [phone]   I certify that I have examined this patient and that occupational/physical/speech therapy is necessary on an outpatient basis.    The patient has expressed interest in completing their recommended course of therapy at your  location.  Once a formal order from the patient's primary care physician has been obtained, please  contact him/her to schedule an appointment for evaluation at your earliest convenience.   [ x]  Physical Therapy Evaluate and Treat  [  ]  Occupational Therapy Evaluate and Treat  [  ]  Speech Therapy Evaluate and Treat         The patient's primary care physician (listed above) must furnish and be responsible for a formal order such that the recommended services may be furnished while under the primary physician's care, and that the plan of care will be established and reviewed every 30 days (or more often if condition necessitates).

## 2024-03-14 NOTE — Plan of Care (Signed)
   Problem: Education: Goal: Ability to describe self-care measures that may prevent or decrease complications (Diabetes Survival Skills Education) will improve Outcome: Progressing   Problem: Education: Goal: Individualized Educational Video(s) Outcome: Progressing   Problem: Coping: Goal: Ability to adjust to condition or change in health will improve Outcome: Progressing

## 2024-03-15 DIAGNOSIS — Z89512 Acquired absence of left leg below knee: Secondary | ICD-10-CM

## 2024-03-15 DIAGNOSIS — Z9889 Other specified postprocedural states: Secondary | ICD-10-CM

## 2024-03-15 LAB — CBC
HCT: 23.9 % — ABNORMAL LOW (ref 36.0–46.0)
Hemoglobin: 7.9 g/dL — ABNORMAL LOW (ref 12.0–15.0)
MCH: 32.1 pg (ref 26.0–34.0)
MCHC: 33.1 g/dL (ref 30.0–36.0)
MCV: 97.2 fL (ref 80.0–100.0)
Platelets: 293 10*3/uL (ref 150–400)
RBC: 2.46 MIL/uL — ABNORMAL LOW (ref 3.87–5.11)
RDW: 14 % (ref 11.5–15.5)
WBC: 7.5 10*3/uL (ref 4.0–10.5)
nRBC: 0 % (ref 0.0–0.2)

## 2024-03-15 LAB — BASIC METABOLIC PANEL WITH GFR
Anion gap: 10 (ref 5–15)
BUN: 38 mg/dL — ABNORMAL HIGH (ref 6–20)
CO2: 24 mmol/L (ref 22–32)
Calcium: 7.9 mg/dL — ABNORMAL LOW (ref 8.9–10.3)
Chloride: 90 mmol/L — ABNORMAL LOW (ref 98–111)
Creatinine, Ser: 3.56 mg/dL — ABNORMAL HIGH (ref 0.44–1.00)
GFR, Estimated: 15 mL/min — ABNORMAL LOW (ref >60)
Glucose, Bld: 126 mg/dL — ABNORMAL HIGH (ref 70–99)
Potassium: 3.8 mmol/L (ref 3.5–5.1)
Sodium: 124 mmol/L — ABNORMAL LOW (ref 135–145)

## 2024-03-15 LAB — GLUCOSE, CAPILLARY
Glucose-Capillary: 129 mg/dL — ABNORMAL HIGH (ref 70–99)
Glucose-Capillary: 64 mg/dL — ABNORMAL LOW (ref 70–99)
Glucose-Capillary: 84 mg/dL (ref 70–99)
Glucose-Capillary: 111 mg/dL — ABNORMAL HIGH (ref 70–99)

## 2024-03-15 MED ORDER — EPOETIN ALFA-EPBX 10000 UNIT/ML IJ SOLN
10000.0000 [IU] | INTRAMUSCULAR | Status: DC
  Administered 2024-03-16: 10000 [IU] via INTRAVENOUS
  Filled 2024-03-15 (×3): qty 2

## 2024-03-15 MED ORDER — FAMOTIDINE IN NACL 20-0.9 MG/50ML-% IV SOLN
20.0000 mg | INTRAVENOUS | Status: DC
Start: 2024-03-15 — End: 2024-03-16
  Administered 2024-03-15: 20 mg via INTRAVENOUS
  Filled 2024-03-15: qty 50

## 2024-03-15 MED ORDER — HYDROMORPHONE HCL 1 MG/ML IJ SOLN: 0.5000 mg | Freq: Four times a day (QID) | INTRAMUSCULAR | Status: DC | PRN

## 2024-03-15 NOTE — Progress Notes (Signed)
 [facility]  Daily Progress Note   Subjective  - 2 Days Post-Op  Sleeping comfortably when I entered the room.  Upon waking she states she is having an incredibly difficult time with pain.  She did have some significant nausea and vomiting yesterday but appears to have improved today.  She notes her dressing was changed earlier.  Objective Vitals:   03/14/24 2044 03/15/24 0421 03/15/24 0845 03/15/24 1609  BP: (!) 140/73 103/88 (!) 128/111 (!) 124/100  Pulse: 73 93 93 (!) 57  Resp: 20  (!) 21 17  Temp: 97.9 F (36.6 C) 98.6 F (37 C) 98.7 F (37.1 C) 98.4 F (36.9 C)  TempSrc: Oral Oral Oral Oral  SpO2: 100% 100% 95% 91%  Weight:      Height:        Intake/Output Summary (Last 24 hours) at 03/15/2024 1629 Last data filed at 03/15/2024 1402 Gross per 24 hour  Intake 100 ml  Output --  Net 100 ml    PULM  Normal effort , no use of accessory muscles CV  No JVD, RRR Abd      No distended, nontender VASC  left below-knee amputation dressing clean dry and intact  Laboratory CBC    Component Value Date/Time   WBC 7.5 03/15/2024 0530   HGB 7.9 (L) 03/15/2024 0530   HCT 23.9 (L) 03/15/2024 0530   PLT 293 03/15/2024 0530    BMET    Component Value Date/Time   NA 124 (L) 03/15/2024 0530   K 3.8 03/15/2024 0530   CL 90 (L) 03/15/2024 0530   CO2 24 03/15/2024 0530   GLUCOSE 126 (H) 03/15/2024 0530   BUN 38 (H) 03/15/2024 0530   CREATININE 3.56 (H) 03/15/2024 0530   CREATININE 1.54 (H) 02/26/2019 1139   CALCIUM  7.9 (L) 03/15/2024 0530   GFRNONAA 15 (L) 03/15/2024 0530   GFRAA 52 (L) 04/02/2020 0457    Assessment/Planning: POD #2 s/p left below-knee amputation  Patient is doing well we can adjust her pain medications.  I would like to wean from the Dilaudid  especially the IV and use the OxyContin .   [name]  03/15/2024, 4:29 PM

## 2024-03-15 NOTE — Consult Note (Signed)
 [facility]  CONSULT NOTE    Date: 03/15/2024                  Patient Name:  [name]  MRN: [id number]  DOB: August 04, 1976  Age / Sex: 48 y.o., female         PCP: Darylene Epley, MD                 Service Requesting Consult: Vascular Surgery                 Reason for Consult: End stage renal disease            History of Present Illness: [name] is a 48 y.o.  female with past medical conditions including COPD, anemia, anxiety, bipolar disorder, polysubstance abuse, hypertension, GERD, tobacco abuse, and end-stage renal disease on hemodialysis, who was admitted to [facility] on 03/13/2024 for Osteomyelitis of left foot, unspecified type (HCC) [M86.9] Osteomyelitis of ankle and foot (HCC) [M86.9]  Patient presents to the hospital for a scheduled BKA with vascular surgery.  Patient remains inpatient due to pain control.  Patient seen and evaluated at bedside.  Reports last dialysis treatment was on Tuesday.  Remains on room air with no lower extremity edema.  Does continue to complain of pain in left lower extremity.  Labs have been stable since admission.  Left BKA was performed on 5/21.   Medications: Outpatient medications: Medications Prior to Admission  Medication Sig Dispense Refill Last Dose/Taking   acetaminophen  (TYLENOL ) 325 MG tablet Take 2 tablets (650 mg total) by mouth every 6 (six) hours as needed for mild pain (or Fever >/= 101). 30 tablet 0 03/13/2024   apixaban  (ELIQUIS ) 5 MG TABS tablet Take 5 mg by mouth 2 (two) times daily.   03/12/2024   aspirin  EC 81 MG tablet Take 81 mg by mouth daily. Swallow whole.   03/12/2024   atorvastatin  (LIPITOR) 40 MG tablet Take 40 mg by mouth daily.   03/12/2024   Epoetin Alfa-epbx (RETACRIT IJ) Epoetin alfa - epbx (Retacrit)   03/12/2024   escitalopram (LEXAPRO) 10 MG tablet Take 10 mg by mouth daily.   03/12/2024   furosemide  (LASIX ) 80 MG tablet Take 80 mg by mouth 2 (two) times daily.   03/12/2024    gabapentin  (NEURONTIN ) 600 MG tablet Take 600 mg by mouth at bedtime as needed (pt. states she takes 2 600mg  tables at bedtime.).   03/12/2024 Bedtime   gabapentin  (NEURONTIN ) 600 MG tablet Take 600 mg by mouth daily. Pt. States she takes 1 tablet during the AM.   03/12/2024   hydrOXYzine  (ATARAX ) 25 MG tablet Take 25 mg by mouth at bedtime as needed for anxiety (pt. states she takes two 25mg  tablets at bedtime for insomnia and anxiety.).   03/12/2024   oxyCODONE -acetaminophen  (PERCOCET) 5-325 MG tablet Take 1 tablet by mouth every 6 (six) hours as needed for severe pain (pain score 7-10) or moderate pain (pain score 4-6). 12 tablet 0 03/13/2024   pantoprazole  (PROTONIX ) 20 MG tablet Take 20 mg by mouth as needed.   03/12/2024   amoxicillin -clavulanate (AUGMENTIN ) 500-125 MG tablet Take 1 tablet by mouth. Unknown frequency. (Patient not taking: Reported on 03/13/2024)   Not Taking   azelastine (ASTELIN) 0.1 % nasal spray Place 2 sprays into both nostrils 2 (two) times daily. Use 1-2 sprays in each nostril as directed   03/11/2024   doxycycline  (VIBRAMYCIN ) 100 MG capsule Take 100 mg by  mouth 2 (two) times daily. For 7 days.   03/09/2024   promethazine  (PHENERGAN ) 25 MG tablet Take 25 mg by mouth every 6 (six) hours as needed for nausea or vomiting.  3 03/10/2024    Current medications: Current Facility-Administered Medications  Medication Dose Route Frequency Provider Last Rate Last Admin   acetaminophen  (TYLENOL ) tablet 325-650 mg  325-650 mg Oral Q6H PRN Schnier, Gregory G, MD       alum & mag hydroxide-simeth (MAALOX/MYLANTA) 200-200-20 MG/5ML suspension 15-30 mL  15-30 mL Oral Q2H PRN Schnier, Ninette Basque, MD       docusate sodium  (COLACE) capsule 100 mg  100 mg Oral Daily Schnier, Gregory G, MD   100 mg at 03/15/24 1053   famotidine  (PEPCID ) IVPB 20 mg premix  20 mg Intravenous Q24H Greenwood, Howard F, [facility]       furosemide  (LASIX ) tablet 40 mg  40 mg Oral Daily Duncan, Hazel V, MD   40 mg at 03/15/24  1054   gabapentin  (NEURONTIN ) capsule 1,200 mg  1,200 mg Oral QHS PRN Duncan, Hazel V, MD   1,200 mg at 03/14/24 2127   guaiFENesin -dextromethorphan (ROBITUSSIN DM) 100-10 MG/5ML syrup 15 mL  15 mL Oral Q4H PRN Schnier, Gregory G, MD       heparin  injection 5,000 Units  5,000 Units Subcutaneous Q8H Schnier, Gregory G, MD   5,000 Units at 03/15/24 1331   hydrALAZINE  (APRESOLINE ) injection 5 mg  5 mg Intravenous Q20 Min PRN Schnier, Gregory G, MD       HYDROmorphone  (DILAUDID ) injection 0.5-1 mg  0.5-1 mg Intravenous Q4H PRN Schnier, Gregory G, MD   1 mg at 03/15/24 1330   HYDROmorphone  (DILAUDID ) tablet 2-3 mg  2-3 mg Oral Q4H PRN Schnier, Gregory G, MD   3 mg at 03/14/24 2114   hydrOXYzine  (ATARAX ) tablet 50 mg  50 mg Oral QHS PRN Duncan, Hazel V, MD   50 mg at 03/14/24 2114   insulin  aspart (novoLOG ) injection 0-15 Units  0-15 Units Subcutaneous TID WC Schnier, Gregory G, MD   2 Units at 03/15/24 4696   insulin  aspart (novoLOG ) injection 0-5 Units  0-5 Units Subcutaneous QHS Jackquelyn Mass, MD   2 Units at 03/13/24 2330   labetalol  (NORMODYNE ) injection 10 mg  10 mg Intravenous Q10 min PRN Schnier, Ninette Basque, MD       magnesium  sulfate IVPB 2 g 50 mL  2 g Intravenous Daily PRN Schnier, Gregory G, MD       metoprolol  tartrate (LOPRESSOR ) injection 2-5 mg  2-5 mg Intravenous Q2H PRN Schnier, Gregory G, MD       ondansetron  (ZOFRAN ) injection 4 mg  4 mg Intravenous Q6H PRN Schnier, Gregory G, MD   4 mg at 03/15/24 2952   oxyCODONE  (Oxy IR/ROXICODONE ) immediate release tablet 5-10 mg  5-10 mg Oral Q4H PRN Schnier, Gregory G, MD   10 mg at 03/15/24 1222   phenol (CHLORASEPTIC) mouth spray 1 spray  1 spray Mouth/Throat PRN Schnier, Gregory G, MD       polyethylene glycol (MIRALAX  / GLYCOLAX ) packet 17 g  17 g Oral Daily PRN Schnier, Gregory G, MD       sodium chloride  flush (NS) 0.9 % injection 3 mL  3 mL Intravenous Q12H Schnier, Ninette Basque, MD   3 mL at 03/15/24 1054   sodium chloride  flush (NS)  0.9 % injection 3 mL  3 mL Intravenous PRN Schnier, Ninette Basque, MD  sorbitol 70 % solution 30 mL  30 mL Oral Daily PRN Schnier, Gregory G, MD          Allergies: Allergies  Allergen Reactions   Albumin (Human)     unknown   Erythromycin Rash   Midecamycin Rash      Past Medical History: Past Medical History:  Diagnosis Date   Anemia    Anxiety    Bipolar disorder (HCC)    Cervical cancer (HCC)    Chronic combined systolic and diastolic CHF (congestive heart failure) (HCC)    a. ECHO 04/23/14 LV EF: 45% - 50%,  mild LVH, diffuse hypokinesis. LV diastolic dysfunction, high ventricular filling pressure. Mild LA dilation.   Cocaine abuse (HCC)    Complication of anesthesia    a.) delayed/prolonged emergence   COPD (chronic obstructive pulmonary disease) (HCC)    Deep vein thrombosis (DVT) of popliteal vein of left lower extremity (HCC)    Depression    Diabetes mellitus without complication (HCC)    Diarrhea    Dyspnea    ESRD on hemodialysis (HCC)    GERD (gastroesophageal reflux disease)    Heroin overdose (HCC) 10/2018   History of kidney stones    Hypertension    Intractable nausea and vomiting 02/18/2024   Mitral stenosis    MRSA infection    arm wound   Nicotine  dependence with current use    Pneumonia    Seizures (HCC)    last seizure March 2021; usual 1-2 per month   Stroke [facility])    mini stroke when on drugs     Past Surgical History: Past Surgical History:  Procedure Laterality Date   ABDOMINAL HYSTERECTOMY     ACHILLES TENDON SURGERY Left 03/31/2020   Procedure: ACHILLES LENGTHENING;  Surgeon: Pink Bridges, DPM;  Location: [facility] ORS;  Service: Podiatry;  Laterality: Left;   AMPUTATION Right 10/04/2020   Procedure: PARTIAL AMPUTATION RAY 4th and 5th;  Surgeon: Pink Bridges, DPM;  Location: [facility] ORS;  Service: Podiatry;  Laterality: Right;   AMPUTATION Left 03/13/2024   Procedure: AMPUTATION BELOW KNEE;  Surgeon: Jackquelyn Mass, MD;  Location:  [facility] ORS;  Service: Vascular;  Laterality: Left;   AMPUTATION TOE Left 03/23/2019   Procedure: AMPUTATION TOE GREAT LEFT TOE;  Surgeon: Sharlyn Deaner, DPM;  Location: [facility] ORS;  Service: Podiatry;  Laterality: Left;   AMPUTATION TOE Left 08/16/2019   Procedure: AMPUTATION TOE 5th;  Surgeon: Pink Bridges, DPM;  Location: [facility] ORS;  Service: Podiatry;  Laterality: Left;   AMPUTATION TOE Left 02/05/2020   Procedure: AMPUTATION MPJ LEFT 4TH;  Surgeon: Pink Bridges, DPM;  Location: [facility] ORS;  Service: Podiatry;  Laterality: Left;   ANKLE FRACTURE SURGERY Left    pins/screws in place   AV FISTULA PLACEMENT  2025   BUBBLE STUDY  01/08/2021   Procedure: BUBBLE STUDY;  Surgeon: Lenise Quince, MD;  Location: [facility] ENDOSCOPY;  Service: Cardiovascular;;   CHOLECYSTECTOMY  2005   FRACTURE SURGERY     IRRIGATION AND DEBRIDEMENT FOOT Left 10/04/2020   Procedure: IRRIGATION AND DEBRIDEMENT FOOT;  Surgeon: Pink Bridges, DPM;  Location: [facility] ORS;  Service: Podiatry;  Laterality: Left;   LOWER EXTREMITY ANGIOGRAPHY Left 03/12/2019   Procedure: LOWER EXTREMITY ANGIOGRAPHY;  Surgeon: Jackquelyn Mass, MD;  Location: [facility] INVASIVE CV LAB;  Service: Cardiovascular;  Laterality: Left;   LOWER EXTREMITY ANGIOGRAPHY Right 10/06/2020   Procedure: Lower Extremity Angiography;  Surgeon: Jackquelyn Mass, MD;  Location: [facility] INVASIVE CV LAB;  Service: Cardiovascular;  Laterality: Right;   TEE WITHOUT CARDIOVERSION N/A 03/26/2019   Procedure: TRANSESOPHAGEAL ECHOCARDIOGRAM (TEE);  Surgeon: Sammy Crisp, MD;  Location: [facility] ORS;  Service: Cardiovascular;  Laterality: N/A;   TEE WITHOUT CARDIOVERSION N/A 01/08/2021   Procedure: TRANSESOPHAGEAL ECHOCARDIOGRAM (TEE);  Surgeon: Lenise Quince, MD;  Location: [facility] ENDOSCOPY;  Service: Cardiovascular;  Laterality: N/A;   TRANSMETATARSAL AMPUTATION Left 03/31/2020   Procedure: TRANSMETATARSAL AMPUTATION;  Surgeon: Pink Bridges, DPM;  Location: [facility] ORS;  Service:  Podiatry;  Laterality: Left;   TUBAL LIGATION       Family History: Family History  Problem Relation Age of Onset   Hypertension Other    Drug abuse Mother    Seizures Neg Hx      Social History: Social History   Socioeconomic History   Marital status: Single    Spouse name: Not on file   Number of children: 0   Years of education: 12   Highest education level: Not on file  Occupational History   Occupation: Unemployed  Tobacco Use   Smoking status: Every Day    Current packs/day: 1.00    Average packs/day: 1 pack/day for 28.0 years (28.0 ttl pk-yrs)    Types: Cigarettes   Smokeless tobacco: Never  Vaping Use   Vaping status: Never Used  Substance and Sexual Activity   Alcohol use: Not Currently    Alcohol/week: 0.0 standard drinks of alcohol    Comment: Quit drinking 11/16 per pt   Drug use: Yes    Types: Marijuana    Comment: daily   Sexual activity: Yes  Other Topics Concern   Not on file  Social History Narrative   Lives at home with mother.    Caffeine use: Soda (drinks 3L per day)   Social Drivers of Corporate investment banker Strain: Not on file  Food Insecurity: No Food Insecurity (03/13/2024)   Hunger Vital Sign    Worried About Running Out of Food in the Last Year: Never true    Ran Out of Food in the Last Year: Never true  Transportation Needs: No Transportation Needs (03/13/2024)   PRAPARE - Administrator, Civil Service (Medical): No    Lack of Transportation (Non-Medical): No  Physical Activity: Not on file  Stress: Not on file  Social Connections: Not on file  Intimate Partner Violence: Not At Risk (03/13/2024)   Humiliation, Afraid, Rape, and Kick questionnaire    Fear of Current or Ex-Partner: No    Emotionally Abused: No    Physically Abused: No    Sexually Abused: No     Review of Systems: Review of Systems  Constitutional:  Negative for chills, fever and malaise/fatigue.  HENT:  Negative for congestion, sore throat  and tinnitus.   Eyes:  Negative for blurred vision and redness.  Respiratory:  Negative for cough, shortness of breath and wheezing.   Cardiovascular:  Negative for chest pain, palpitations, claudication and leg swelling.  Gastrointestinal:  Negative for abdominal pain, blood in stool, diarrhea, nausea and vomiting.  Genitourinary:  Negative for flank pain, frequency and hematuria.  Musculoskeletal:  Negative for back pain, falls and myalgias.  Skin:  Negative for rash.  Neurological:  Negative for dizziness, weakness and headaches.  Endo/Heme/Allergies:  Does not bruise/bleed easily.  Psychiatric/Behavioral:  Negative for depression. The patient is not nervous/anxious and does not have insomnia.     Vital Signs: Blood pressure (!) 128/111, pulse 93, temperature 98.7 F (37.1 C), temperature  source Oral, resp. rate (!) 21, height 5\' 9"  (1.753 m), weight 59 kg, SpO2 95%.  Weight trends: Filed Weights   03/13/24 0628  Weight: 59 kg    Physical Exam: General: NAD  Head: Normocephalic, atraumatic. Moist oral mucosal membranes  Eyes: Anicteric  Lungs:  Clear to auscultation, normal effort  Heart: Regular rate and rhythm  Abdomen:  Soft, nontender, nondistended  Extremities: No peripheral edema.  Left BKA (5/21)  Neurologic: Alert and oriented, moving all four extremities  Skin: No lesions  Access: Right upper AVF     Lab results: Basic Metabolic Panel: Recent Labs  Lab 03/13/24 0648 03/13/24 1032 03/15/24 0530  NA 137  --  124*  K 3.6  --  3.8  CL 98  --  90*  CO2  --   --  24  GLUCOSE 114*  --  126*  BUN 25*  --  38*  CREATININE 3.00* 2.85* 3.56*  CALCIUM   --   --  7.9*    Liver Function Tests: No results for input(s): "AST", "ALT", "ALKPHOS", "BILITOT", "PROT", "ALBUMIN" in the last 168 hours. No results for input(s): "LIPASE", "AMYLASE" in the last 168 hours. No results for input(s): "AMMONIA" in the last 168 hours.  CBC: Recent Labs  Lab 03/13/24 0648  03/13/24 1032 03/15/24 0530  WBC  --  12.0* 7.5  HGB 10.9* 9.1* 7.9*  HCT 32.0* 27.1* 23.9*  MCV  --  97.5 97.2  PLT  --  331 293    Cardiac Enzymes: No results for input(s): "CKTOTAL", "CKMB", "CKMBINDEX", "TROPONINI" in the last 168 hours.  BNP: Invalid input(s): "POCBNP"  CBG: Recent Labs  Lab 03/14/24 1141 03/14/24 1638 03/14/24 2134 03/15/24 0844 03/15/24 1210  GLUCAP 152* 176* 143* 129* 64*    Microbiology: Results for orders placed or performed during the hospital encounter of 03/06/24  Surgical pcr screen     Status: None   Collection Time: 03/06/24  1:47 PM   Specimen: Nasal Mucosa; Nasal Swab  Result Value Ref Range Status   MRSA, PCR NEGATIVE NEGATIVE Final   Staphylococcus aureus NEGATIVE NEGATIVE Final    Comment: (NOTE) The Xpert SA Assay (FDA approved for NASAL specimens in patients 60 years of age and older), is one component of a comprehensive surveillance program. It is not intended to diagnose infection nor to guide or monitor treatment. Performed at [facility] [address] [place], [place] [address]     Coagulation Studies: No results for input(s): "LABPROT", "INR" in the last 72 hours.  Urinalysis: No results for input(s): "COLORURINE", "LABSPEC", "PHURINE", "GLUCOSEU", "HGBUR", "BILIRUBINUR", "KETONESUR", "PROTEINUR", "UROBILINOGEN", "NITRITE", "LEUKOCYTESUR" in the last 72 hours.  Invalid input(s): "APPERANCEUR"    Imaging: No results found.   Assessment & Plan: Ms. LEIGHLA CHESTNUTT is a 48 y.o.  female with past medical conditions including COPD, anemia, anxiety, bipolar disorder, polysubstance abuse, hypertension, GERD, tobacco abuse, and end-stage renal disease on hemodialysis, who was admitted to [facility] on 03/13/2024 for Osteomyelitis of left foot, unspecified type (HCC) [M86.9] Osteomyelitis of ankle and foot (HCC) [M86.9]  [facility] [place]/TTS/right aVF  End-stage renal disease on hemodialysis.  Last treatment  received on Tuesday.  Due to clinical presentation and labs, it is determined patient is stable.  Next treatment scheduled for Saturday to maintain outpatient schedule.  2. Anemia of chronic kidney disease Lab Results  Component Value Date   HGB 7.9 (L) 03/15/2024    Will order ESA with scheduled dialysis.  3.  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  Vascular surgery performed on 5/21.  Pain management per primary team.  4. Secondary Hyperparathyroidism: with outpatient labs: None available Lab Results  Component Value Date   CALCIUM  7.9 (L) 03/15/2024   CAION 1.10 (L) 03/13/2024   PHOS 3.3 01/11/2021    Calcium  within optimal range.  Will check phosphorus in a.m. with dialysis.  LOS: 2 Anola King 5/23/20253:46 PM

## 2024-03-15 NOTE — TOC Progression Note (Signed)
 Transition of Care [facility]) - Progression Note    Patient Details  Name: [name] MRN: [id number] Date of Birth: 1976-07-21  Transition of Care [facility]) CM/SW Contact  Loman Risk, RN Phone Number: 03/15/2024, 3:15 PM  Clinical Narrative:    Signed outpatient therapy and demographics faxed to [facility] at [phone]   Patient declines tub bench and states she prefers to use her shower chair    Expected Discharge Plan: Home w Home Health Services Barriers to Discharge: Continued Medical Work up  Expected Discharge Plan and Services     Post Acute Care Choice: Home Health Living arrangements for the past 2 months: Single Family Home                                       Social Determinants of Health (SDOH) Interventions SDOH Screenings   Food Insecurity: No Food Insecurity (03/13/2024)  Housing: Low Risk  (03/13/2024)  Transportation Needs: No Transportation Needs (03/13/2024)  Utilities: Not At Risk (03/13/2024)  Depression (PHQ2-9): Low Risk  (02/26/2019)  Tobacco Use: High Risk (03/13/2024)    Readmission Risk Interventions    03/14/2024   11:25 AM  Readmission Risk Prevention Plan  Transportation Screening Complete  PCP or Specialist Appt within 3-5 Days Complete  Social Work Consult for Recovery Care Planning/Counseling Complete  Palliative Care Screening Not Applicable  Medication Review Oceanographer) Complete

## 2024-03-15 NOTE — Progress Notes (Addendum)
 Occupational Therapy Treatment Patient Details Name: [name] MRN: [id number] DOB: 11-22-1975 Today's Date: 03/15/2024   History of present illness [name]  has presented today for surgery, with the diagnosis of L foot osteomyelitis s/p L BKA on 03/13/24.   OT comments  Pt is seated in wheelchair on arrival. Pleasant and agreeable to OT session. She is c/o increased pain to incision site on LLE. Pt performed wheelchair mobility within the room with supervision, min assist to get through bathroom door with mother present and able to demo assisting. Per pt and mother, her W/C will fit through the bathroom door, but she will likely have to hop a a few steps to get to her toilet. Pt required education on how to operate elevating leg rests and how to swing them out of the way to get up to RW and hop to toilet. Pt with good safety, locks and unlocks brakes appropriately. CGA for STS from W/C to RW with ability to hop and turn to toilet with CGA. Standing clothing management performed with constant unilateral support on RW or grab bar with CGA from therapist and cueing to push down through UE to maintain balance and keep walker from tipping. Pt returned to W/C and began vomiting. Provided wash cloth and emesis bag and wheeled pt back to bed with SPT performed holding bed rail with supervision. HOB elevated and nurse notified of her vomiting episode. Left with all needs in place and will cont to require skilled acute OT services to maximize her safety and IND to return to PLOF.       If plan is discharge home, recommend the following:  A little help with walking and/or transfers;A little help with bathing/dressing/bathroom;Assistance with cooking/housework;Help with stairs or ramp for entrance   Equipment Recommendations  Tub/shower bench (will eventually need tub bench once able to return to showring)    Recommendations for Other Services      Precautions / Restrictions  Precautions Precautions: Fall Recall of Precautions/Restrictions: Intact Restrictions Weight Bearing Restrictions Per Provider Order: Yes LLE Weight Bearing Per Provider Order: Non weight bearing Other Position/Activity Restrictions: L BKA       Mobility Bed Mobility Overal bed mobility: Independent                  Transfers Overall transfer level: Needs assistance Equipment used: Rolling walker (2 wheels) Transfers: Bed to chair/wheelchair/BSC, Sit to/from Stand Sit to Stand: Supervision     Step pivot transfers: Contact guard assist     General transfer comment: hopped a few steps to toilet and back to W/C using RW with CGA     Balance Overall balance assessment: Needs assistance Sitting-balance support: Feet supported Sitting balance-Leahy Scale: Normal     Standing balance support: Bilateral upper extremity supported, Reliant on assistive device for balance, During functional activity Standing balance-Leahy Scale: Fair Standing balance comment: CGA and reliant on RW or grab bar for clothing management in standing at toilet                           ADL either performed or assessed with clinical judgement   ADL Overall ADL's : Needs assistance/impaired                         Toilet Transfer: Contact guard assist;Grab bars;Rolling walker (2 wheels)   Toileting- Clothing Manipulation and Hygiene: Supervision/safety;Sitting/lateral lean;Contact guard assist;Sit to/from stand Toileting -  Clothing Manipulation Details (indicate cue type and reason): seated peri-care with no issues; CGA in standing for clothing management and use of grab bar in standing; edu on seated clothing management in W/C prior to transfer if needed     Functional mobility during ADLs: Contact guard assist;Rolling walker (2 wheels);Wheelchair General ADL Comments: able to self propel into bathroom, lock brakes, assist to swing leg rest out then stood at RW and hopped  to turn to toilet with CGA    Extremity/Trunk Assessment              Vision       Perception     Praxis     Communication Communication Communication: No apparent difficulties   Cognition Arousal: Alert Behavior During Therapy: WFL for tasks assessed/performed                                 Following commands: Intact        Cueing   Cueing Techniques: Verbal cues  Exercises Other Exercises Other Exercises: Edu pt and her mother who was present for the session on providing CGA/SBA for any standing balance tasks or hopping to toilet. Edu on placing BSC over toilet to maximize ease.    Shoulder Instructions       General Comments vomited after toileting tasks this session-nurse made aware    Pertinent Vitals/ Pain       Pain Assessment Pain Assessment: Faces Faces Pain Scale: Hurts whole lot Pain Location: L lower limb at surgical site Pain Descriptors / Indicators: Throbbing Pain Intervention(s): Limited activity within patient's tolerance, Monitored during session, Premedicated before session  Home Living                                          Prior Functioning/Environment              Frequency  Min 2X/week        Progress Toward Goals  OT Goals(current goals can now be found in the care plan section)  Progress towards OT goals: Progressing toward goals  Acute Rehab OT Goals Patient Stated Goal: improve function and pain OT Goal Formulation: With patient Time For Goal Achievement: 03/28/24 Potential to Achieve Goals: Good  Plan      Co-evaluation                 AM-PAC OT "6 Clicks" Daily Activity     Outcome Measure   Help from another person eating meals?: None Help from another person taking care of personal grooming?: None Help from another person toileting, which includes using toliet, bedpan, or urinal?: A Little Help from another person bathing (including washing, rinsing, drying)?:  A Little Help from another person to put on and taking off regular upper body clothing?: None Help from another person to put on and taking off regular lower body clothing?: A Little 6 Click Score: 21    End of Session Equipment Utilized During Treatment: Rolling walker (2 wheels);Gait belt (wheelchair)  OT Visit Diagnosis: Other abnormalities of gait and mobility (R26.89)   Activity Tolerance Patient tolerated treatment well   Patient Left in bed;with call bell/phone within reach;with bed alarm set;with family/visitor present   Nurse Communication Mobility status        Time: 2956-2130 OT Time Calculation (min): 29 min  Charges: OT General Charges $OT Visit: 1 Visit OT Treatments $Self Care/Home Management : 23-37 mins  Hillard Goodwine, OTR/L  03/15/24, 12:40 PM   Leonard Raker 03/15/2024, 12:36 PM

## 2024-03-15 NOTE — Care Management Important Message (Signed)
 Important Message  Patient Details  Name: [name] MRN: [id number] Date of Birth: 1976-05-09   Important Message Given:  Yes - Medicare IM     Anise Kerns 03/15/2024, 2:39 PM

## 2024-03-15 NOTE — Progress Notes (Signed)
 Physical Therapy Treatment Patient Details Name: FAUN MCQUEEN MRN: [id number] DOB: [birth year] Today's Date: 03/15/2024   History of Present Illness Destane A Biermann  has presented today for surgery, with the diagnosis of L foot osteomyelitis s/p L BKA on 03/13/24.    PT Comments  Pt received supine in bed. Despite pain medication, reports significant pain. Still having N/V. Agreeable to treatment with practicing w/c mobility. Supine to sit independently. Education provided at sitting on safe w/c mgmt as listed below with regards to transfers and ways to prevent falls risk. Pt understanding and able to STS and SPT using RW at [facility] to supervision to w/c. Able to independently use leg rests to set up LE's and self propel 2 laps around NSG station at supervision. Pt left in room in w/c in care of OT. Pt making good progression in mobility. Deferring hopping in room due to N/V but is displaying safe ability to manage at w/c level transferring with RW if needed. Did educate on probably need for hopping to <> from bathroom due to reports of small living quarters and unlikelihood of being able to maneuver w/c in bathroom. D/c recs remain appropriate.    If plan is discharge home, recommend the following: A little help with walking and/or transfers;A little help with bathing/dressing/bathroom;Assist for transportation;Help with stairs or ramp for entrance;Assistance with cooking/housework   Can travel by private vehicle        Equipment Recommendations  Other (comment) (tub bench)    Recommendations for Other Services       Precautions / Restrictions Precautions Precautions: Fall Recall of Precautions/Restrictions: Intact Restrictions Weight Bearing Restrictions Per Provider Order: Yes LLE Weight Bearing Per Provider Order: Non weight bearing Other Position/Activity Restrictions: L BKA     Mobility  Bed Mobility Overal bed mobility: Independent Bed Mobility: Supine to Sit     Supine to  sit: Independent       Patient Response: Cooperative  Transfers Overall transfer level: Needs assistance Equipment used: Rolling walker (2 wheels) Transfers: Bed to chair/wheelchair/BSC Sit to Stand: Supervision Stand pivot transfers: Supervision         General transfer comment: improved safety with RW use.    Ambulation/Gait Ambulation/Gait assistance: Supervision Gait Distance (Feet): 400 Feet Assistive device: Pushed wheelchair         General Gait Details: propelling w/c 2 laps around NSG unit   Stairs             Wheelchair Mobility     Tilt Bed Tilt Bed Patient Response: Cooperative  Modified Rankin (Stroke Patients Only)       Balance Overall balance assessment: Needs assistance Sitting-balance support: Feet supported Sitting balance-Leahy Scale: Normal     Standing balance support: Bilateral upper extremity supported, Reliant on assistive device for balance Standing balance-Leahy Scale: Fair Standing balance comment: reliant on RW for support during transfer/standing; CGA for transfer                            Communication Communication Communication: No apparent difficulties  Cognition Arousal: Alert Behavior During Therapy: WFL for tasks assessed/performed   PT - Cognitive impairments: No apparent impairments                         Following commands: Intact      Cueing Cueing Techniques: Verbal cues  Exercises Other Exercises Other Exercises: provided exercise packet. Discussed safe use of  w/c for transfers (brake mgmt, w/c leg rest use). IMportance of hopping in home due to tight quarters and risk of hitting residual limb in door frames and other houseing features trying to mobilize w/c in house.    General Comments        Pertinent Vitals/Pain Pain Assessment Pain Assessment: Faces Faces Pain Scale: Hurts whole lot Pain Location: L lower limb at surgical site Pain Descriptors / Indicators:  Throbbing Pain Intervention(s): Limited activity within patient's tolerance, Monitored during session, Premedicated before session    Home Living                          Prior Function            PT Goals (current goals can now be found in the care plan section) Acute Rehab PT Goals Patient Stated Goal: improve her pain PT Goal Formulation: With patient Time For Goal Achievement: 03/28/24 Potential to Achieve Goals: Good Progress towards PT goals: Progressing toward goals    Frequency    Min 3X/week      PT Plan      Co-evaluation              AM-PAC PT "6 Clicks" Mobility   Outcome Measure  Help needed turning from your back to your side while in a flat bed without using bedrails?: None Help needed moving from lying on your back to sitting on the side of a flat bed without using bedrails?: None Help needed moving to and from a bed to a chair (including a wheelchair)?: A Little Help needed standing up from a chair using your arms (e.g., wheelchair or bedside chair)?: A Little Help needed to walk in hospital room?: A Lot Help needed climbing 3-5 steps with a railing? : A Lot 6 Click Score: 18    End of Session   Activity Tolerance: Patient tolerated treatment well Patient left:  (in w/c in care of OT) Nurse Communication: Mobility status PT Visit Diagnosis: Other abnormalities of gait and mobility (R26.89);Muscle weakness (generalized) (M62.81);Difficulty in walking, not elsewhere classified (R26.2)     Time: 7564-3329 PT Time Calculation (min) (ACUTE ONLY): 27 min  Charges:    $Self Care/Home Management: 23-37 PT General Charges $$ ACUTE PT VISIT: 1 Visit                     Marc Senior. Fairly IV, PT, DPT Physical Therapist- [place]  [facility]  03/15/2024, 12:08 PM

## 2024-03-16 LAB — CBC
HCT: 23 % — ABNORMAL LOW (ref 36.0–46.0)
HCT: 28.6 % — ABNORMAL LOW (ref 36.0–46.0)
Hemoglobin: 7.4 g/dL — ABNORMAL LOW (ref 12.0–15.0)
Hemoglobin: 9.2 g/dL — ABNORMAL LOW (ref 12.0–15.0)
MCH: 31.4 pg (ref 26.0–34.0)
MCH: 31.7 pg (ref 26.0–34.0)
MCHC: 32.2 g/dL (ref 30.0–36.0)
MCHC: 32.2 g/dL (ref 30.0–36.0)
MCV: 97.5 fL (ref 80.0–100.0)
MCV: 98.6 fL (ref 80.0–100.0)
Platelets: 290 10*3/uL (ref 150–400)
Platelets: 364 10*3/uL (ref 150–400)
RBC: 2.36 MIL/uL — ABNORMAL LOW (ref 3.87–5.11)
RBC: 2.9 MIL/uL — ABNORMAL LOW (ref 3.87–5.11)
RDW: 14.1 % (ref 11.5–15.5)
RDW: 13.9 % (ref 11.5–15.5)
WBC: 6.8 10*3/uL (ref 4.0–10.5)
WBC: 7.7 10*3/uL (ref 4.0–10.5)
nRBC: 0 % (ref 0.0–0.2)
nRBC: 0 % (ref 0.0–0.2)

## 2024-03-16 LAB — BASIC METABOLIC PANEL WITH GFR
Anion gap: 13 (ref 5–15)
BUN: 40 mg/dL — ABNORMAL HIGH (ref 6–20)
CO2: 25 mmol/L (ref 22–32)
Calcium: 8.4 mg/dL — ABNORMAL LOW (ref 8.9–10.3)
Chloride: 93 mmol/L — ABNORMAL LOW (ref 98–111)
Creatinine, Ser: 3.75 mg/dL — ABNORMAL HIGH (ref 0.44–1.00)
GFR, Estimated: 14 mL/min — ABNORMAL LOW (ref >60)
Glucose, Bld: 126 mg/dL — ABNORMAL HIGH (ref 70–99)
Potassium: 3.8 mmol/L (ref 3.5–5.1)
Sodium: 131 mmol/L — ABNORMAL LOW (ref 135–145)

## 2024-03-16 LAB — RENAL FUNCTION PANEL
Albumin: 3.3 g/dL — ABNORMAL LOW (ref 3.5–5.0)
Anion gap: 13 (ref 5–15)
BUN: 40 mg/dL — ABNORMAL HIGH (ref 6–20)
CO2: 25 mmol/L (ref 22–32)
Calcium: 8.6 mg/dL — ABNORMAL LOW (ref 8.9–10.3)
Chloride: 95 mmol/L — ABNORMAL LOW (ref 98–111)
Creatinine, Ser: 3.7 mg/dL — ABNORMAL HIGH (ref 0.44–1.00)
GFR, Estimated: 14 mL/min — ABNORMAL LOW (ref >60)
Glucose, Bld: 103 mg/dL — ABNORMAL HIGH (ref 70–99)
Phosphorus: 4.4 mg/dL (ref 2.5–4.6)
Potassium: 3.6 mmol/L (ref 3.5–5.1)
Sodium: 133 mmol/L — ABNORMAL LOW (ref 135–145)

## 2024-03-16 LAB — GLUCOSE, CAPILLARY
Glucose-Capillary: 112 mg/dL — ABNORMAL HIGH (ref 70–99)
Glucose-Capillary: 96 mg/dL (ref 70–99)
Glucose-Capillary: 123 mg/dL — ABNORMAL HIGH (ref 70–99)
Glucose-Capillary: 124 mg/dL — ABNORMAL HIGH (ref 70–99)
Glucose-Capillary: 98 mg/dL (ref 70–99)

## 2024-03-16 LAB — HEPATITIS B SURFACE ANTIGEN: Hepatitis B Surface Ag: NONREACTIVE

## 2024-03-16 MED ORDER — NEPRO/CARBSTEADY PO LIQD: 237.0000 mL | ORAL | Status: DC | PRN

## 2024-03-16 MED ORDER — PENTAFLUOROPROP-TETRAFLUOROETH EX AERO: 1.0000 | INHALATION_SPRAY | CUTANEOUS | Status: DC | PRN

## 2024-03-16 MED ORDER — LIDOCAINE HCL (PF) 1 % IJ SOLN: 5.0000 mL | INTRAMUSCULAR | Status: DC | PRN

## 2024-03-16 MED ORDER — LIDOCAINE-PRILOCAINE 2.5-2.5 % EX CREA: 1.0000 | TOPICAL_CREAM | CUTANEOUS | Status: DC | PRN

## 2024-03-16 MED ORDER — HEPARIN SODIUM (PORCINE) 1000 UNIT/ML DIALYSIS: 2000.0000 [IU] | INTRAMUSCULAR | Status: DC | PRN

## 2024-03-16 MED ORDER — ANTICOAGULANT SODIUM CITRATE 4% (200MG/5ML) IV SOLN: 5.0000 mL | Status: DC | PRN

## 2024-03-16 MED ORDER — HEPARIN SODIUM (PORCINE) 1000 UNIT/ML DIALYSIS: 2000.0000 [IU] | Freq: Once | INTRAMUSCULAR | Status: DC

## 2024-03-16 MED ORDER — HEPARIN SODIUM (PORCINE) 1000 UNIT/ML DIALYSIS: 1000.0000 [IU] | INTRAMUSCULAR | Status: DC | PRN

## 2024-03-16 MED ORDER — FAMOTIDINE 20 MG PO TABS
20.0000 mg | ORAL_TABLET | Freq: Every day | ORAL | Status: DC
  Administered 2024-03-16 – 2024-03-18 (×3): 20 mg via ORAL
  Filled 2024-03-16 (×3): qty 1

## 2024-03-16 MED ORDER — ALTEPLASE 2 MG IJ SOLR: 2.0000 mg | Freq: Once | INTRAMUSCULAR | Status: DC | PRN

## 2024-03-16 NOTE — Progress Notes (Signed)
 PHARMACIST - PHYSICIAN COMMUNICATION  DR:   Prescilla Brod  CONCERNING: IV to Oral Route Change Policy  RECOMMENDATION: This patient is receiving famotidine  by the intravenous route.  Based on criteria approved by the Pharmacy and Therapeutics Committee, the intravenous medication(s) is/are being converted to the equivalent oral dose form(s).   DESCRIPTION: These criteria include: The patient is eating (either orally or via tube) and/or has been taking other orally administered medications for a least 24 hours The patient has no evidence of active gastrointestinal bleeding or impaired GI absorption (gastrectomy, short bowel, patient on TNA or NPO).  If you have questions about this conversion, please contact the Pharmacy Department  []   [phone] )  Cristine Done [x]   [phone] )  [facility] []   [phone] )  [facility] Benes []   [phone] )  [facility] []   [phone] )  [facility]   Adalberto Acton, [facility] 03/16/2024 9:43 AM

## 2024-03-16 NOTE — Progress Notes (Signed)
 Hemodialysis Note:  Received patient in bed to unit. Alert and oriented. Informed consent singed and in chart.  Treatment initiated: 1330 Treatment completed: 1345  Access used: Right AVF Access issues: Venous access got infiltrated. Patient keeps moving her arm. 2 hours and 47 minutes left. HD NP made aware.  Transported back to room, alert without acute distress. Report given to patient's RN.  Total UF removed: 100 ml Medications given: None  Post HD weight: 60.9 Kg  [facility]

## 2024-03-16 NOTE — Progress Notes (Signed)
 [facility]  ROUNDING NOTE   Subjective:  Patient seen in room resting and while on dialysis. Tolerating dialysis.  Hemodialysis dialysis treatment flowsheet  Blood flow rate (mL/min):399 Arterial pressures (mmHg): -116.96 Venous pressures (mmHg):30.7 TMP (mmHg): 4.64 Ultrafiltration rate (mL/min):0 Dialysate (mL/min): 300  Objective:  Vital signs in last 24 hours:  Temp:  [98 F (36.7 C)-99.7 F (37.6 C)] 98.9 F (37.2 C) (05/24 1315) Pulse Rate:  [57-91] 86 (05/24 1315) Resp:  [15-20] 15 (05/24 1315) BP: (124-146)/(68-127) 141/127 (05/24 1315) SpO2:  [87 %-100 %] 100 % (05/24 1315) Weight:  [61 kg] 61 kg (05/24 1315)  Weight change:  Filed Weights   03/13/24 0628 03/16/24 1315  Weight: 59 kg 61 kg    Intake/Output: I/O last 3 completed shifts: In: 150 [IV Piggyback:150] Out: -    Intake/Output this shift:  No intake/output data recorded.  Physical Exam: General: NAD,   Head: Normocephalic, atraumatic. Moist oral mucosal membranes  Eyes: Anicteric, PERRL  Neck: Supple, trachea midline  Lungs:  Room air, Clear to auscultation  Heart: Regular rate and rhythm  Abdomen:  Soft, nontender,   Extremities:  No peripheral edema.  Neurologic: Nonfocal, moving all four extremities  Skin: No lesions  Access: Rt AVF    Basic Metabolic Panel: Recent Labs  Lab 03/13/24 0648 03/13/24 1032 03/15/24 0530 03/16/24 0526  NA 137  --  124* 131*  K 3.6  --  3.8 3.8  CL 98  --  90* 93*  CO2  --   --  24 25  GLUCOSE 114*  --  126* 126*  BUN 25*  --  38* 40*  CREATININE 3.00* 2.85* 3.56* 3.75*  CALCIUM   --   --  7.9* 8.4*    Liver Function Tests: No results for input(s): "AST", "ALT", "ALKPHOS", "BILITOT", "PROT", "ALBUMIN" in the last 168 hours. No results for input(s): "LIPASE", "AMYLASE" in the last 168 hours. No results for input(s): "AMMONIA" in the last 168 hours.  CBC: Recent Labs  Lab 03/13/24 0648 03/13/24 1032 03/15/24 0530  03/16/24 0526  WBC  --  12.0* 7.5 6.8  HGB 10.9* 9.1* 7.9* 7.4*  HCT 32.0* 27.1* 23.9* 23.0*  MCV  --  97.5 97.2 97.5  PLT  --  331 293 290    Cardiac Enzymes: No results for input(s): "CKTOTAL", "CKMB", "CKMBINDEX", "TROPONINI" in the last 168 hours.  BNP: Invalid input(s): "POCBNP"  CBG: Recent Labs  Lab 03/15/24 1723 03/15/24 2107 03/16/24 0847 03/16/24 1220 03/16/24 1239  GLUCAP 84 111* 112* 96 123*    Microbiology: Results for orders placed or performed during the hospital encounter of 03/06/24  Surgical pcr screen     Status: None   Collection Time: 03/06/24  1:47 PM   Specimen: Nasal Mucosa; Nasal Swab  Result Value Ref Range Status   MRSA, PCR NEGATIVE NEGATIVE Final   Staphylococcus aureus NEGATIVE NEGATIVE Final    Comment: (NOTE) The Xpert SA Assay (FDA approved for NASAL specimens in patients 31 years of age and older), is one component of a comprehensive surveillance program. It is not intended to diagnose infection nor to guide or monitor treatment. Performed at [facility], [address] [place], [place] [address]     Coagulation Studies: No results for input(s): "LABPROT", "INR" in the last 72 hours.  Urinalysis: No results for input(s): "COLORURINE", "LABSPEC", "PHURINE", "GLUCOSEU", "HGBUR", "BILIRUBINUR", "KETONESUR", "PROTEINUR", "UROBILINOGEN", "NITRITE", "LEUKOCYTESUR" in the last 72 hours.  Invalid input(s): "APPERANCEUR"  Imaging: No results found.   Medications:    magnesium  sulfate bolus IVPB      docusate sodium   100 mg Oral Daily   epoetin alfa-epbx (RETACRIT) injection  10,000 Units Intravenous Q T,Th,Sat-1800   famotidine   20 mg Oral QHS   furosemide   40 mg Oral Daily   heparin   5,000 Units Subcutaneous Q8H   insulin  aspart  0-15 Units Subcutaneous TID WC   insulin  aspart  0-5 Units Subcutaneous QHS   sodium chloride  flush  3 mL Intravenous Q12H   acetaminophen , alum & mag hydroxide-simeth, gabapentin ,  guaiFENesin -dextromethorphan, hydrALAZINE , HYDROmorphone  (DILAUDID ) injection, HYDROmorphone , hydrOXYzine , labetalol , magnesium  sulfate bolus IVPB, metoprolol  tartrate, ondansetron , oxyCODONE , phenol, polyethylene glycol, sodium chloride  flush, sorbitol  Assessment/ Plan:  [name] is a 48 y.o.  female  with past medical conditions including COPD, anemia, anxiety, bipolar disorder, polysubstance abuse, hypertension, GERD, tobacco abuse, and end-stage renal disease on hemodialysis, who was admitted to [facility] on 03/13/2024 for Osteomyelitis of left foot, unspecified type (HCC) [M86.9] Osteomyelitis of ankle and foot (HCC) [M86.9]   [facility] [place]/TTS/right aVF   End-stage renal disease on hemodialysis.  Last treatment received on Tuesday.  Due to clinical presentation and labs, it is determined patient is stable.  Patient receiving diaysis today, 2L UF goal as tolerated.   2. Anemia of chronic kidney disease Recent Labs       Lab Results  Component Value Date    HGB 7.9 (L) 03/15/2024      Will giveESA with scheduled dialysis.   3.  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  S/P vascular surgery performed on 5/21.  Pain management per primary team.   4. Secondary Hyperparathyroidism: with outpatient labs: None available Recent Labs       Lab Results  Component Value Date    CALCIUM  8.4 (L) 03/16/2024    CAION 1.10 (L) 03/13/2024    PHOS 3.3 01/11/2021      Calcium  within optimal range.  Pending Phos     LOS: 3 [name] [name] 5/24/20251:33 PM

## 2024-03-17 LAB — GLUCOSE, CAPILLARY
Glucose-Capillary: 141 mg/dL — ABNORMAL HIGH (ref 70–99)
Glucose-Capillary: 105 mg/dL — ABNORMAL HIGH (ref 70–99)
Glucose-Capillary: 183 mg/dL — ABNORMAL HIGH (ref 70–99)
Glucose-Capillary: 84 mg/dL (ref 70–99)

## 2024-03-17 MED ORDER — FUROSEMIDE 40 MG PO TABS
80.0000 mg | ORAL_TABLET | Freq: Two times a day (BID) | ORAL | Status: DC
  Administered 2024-03-17 – 2024-03-19 (×4): 80 mg via ORAL
  Filled 2024-03-17 (×4): qty 2

## 2024-03-17 NOTE — Progress Notes (Signed)
 [facility]  ROUNDING NOTE   Subjective:  Patient seen in bed resting. Denies shortness of breath, denies abdominal issues.    Objective:  Vital signs in last 24 hours:  Temp:  [98.4 F (36.9 C)-99.8 F (37.7 C)] 99.1 F (37.3 C) (05/25 0735) Pulse Rate:  [61-94] 91 (05/25 0735) Resp:  [14-18] 18 (05/25 0735) BP: (100-140)/(64-91) 117/64 (05/25 0735) SpO2:  [85 %-100 %] 100 % (05/25 0735)  Weight change:  Filed Weights   03/13/24 0628 03/16/24 1315  Weight: 59 kg 61 kg    Intake/Output: I/O last 3 completed shifts: In: 290 [P.O.:240; IV Piggyback:50] Out: 100 [Other:100]   Intake/Output this shift:  No intake/output data recorded.  Physical Exam: General: NAD,   Head: Normocephalic, atraumatic. Moist oral mucosal membranes  Eyes: Anicteric, PERRL  Neck: Supple, trachea midline  Lungs:  Clear to auscultation  Heart: Regular rate and rhythm  Abdomen:  Soft, nontender,   Extremities:  No peripheral edema.  Neurologic: Nonfocal, moving all four extremities  Skin: No lesions  Access: Rt Avf    Basic Metabolic Panel: Recent Labs  Lab 03/13/24 0648 03/13/24 1032 03/15/24 0530 03/16/24 0526 03/16/24 1551  NA 137  --  124* 131* 133*  K 3.6  --  3.8 3.8 3.6  CL 98  --  90* 93* 95*  CO2  --   --  24 25 25   GLUCOSE 114*  --  126* 126* 103*  BUN 25*  --  38* 40* 40*  CREATININE 3.00* 2.85* 3.56* 3.75* 3.70*  CALCIUM   --   --  7.9* 8.4* 8.6*  PHOS  --   --   --   --  4.4    Liver Function Tests: Recent Labs  Lab 03/16/24 1551  ALBUMIN 3.3*   No results for input(s): "LIPASE", "AMYLASE" in the last 168 hours. No results for input(s): "AMMONIA" in the last 168 hours.  CBC: Recent Labs  Lab 03/13/24 0648 03/13/24 1032 03/15/24 0530 03/16/24 0526 03/16/24 1551  WBC  --  12.0* 7.5 6.8 7.7  HGB 10.9* 9.1* 7.9* 7.4* 9.2*  HCT 32.0* 27.1* 23.9* 23.0* 28.6*  MCV  --  97.5 97.2 97.5 98.6  PLT  --  331 293 290 364    Cardiac Enzymes: No  results for input(s): "CKTOTAL", "CKMB", "CKMBINDEX", "TROPONINI" in the last 168 hours.  BNP: Invalid input(s): "POCBNP"  CBG: Recent Labs  Lab 03/16/24 1239 03/16/24 1728 03/16/24 2112 03/17/24 0735 03/17/24 1115  GLUCAP 123* 124* 98 141* 105*    Microbiology: Results for orders placed or performed during the hospital encounter of 03/06/24  Surgical pcr screen     Status: None   Collection Time: 03/06/24  1:47 PM   Specimen: Nasal Mucosa; Nasal Swab  Result Value Ref Range Status   MRSA, PCR NEGATIVE NEGATIVE Final   Staphylococcus aureus NEGATIVE NEGATIVE Final    Comment: (NOTE) The Xpert SA Assay (FDA approved for NASAL specimens in patients 46 years of age and older), is one component of a comprehensive surveillance program. It is not intended to diagnose infection nor to guide or monitor treatment. Performed at [facility], [address] [place], [place] [address]     Coagulation Studies: No results for input(s): "LABPROT", "INR" in the last 72 hours.  Urinalysis: No results for input(s): "COLORURINE", "LABSPEC", "PHURINE", "GLUCOSEU", "HGBUR", "BILIRUBINUR", "KETONESUR", "PROTEINUR", "UROBILINOGEN", "NITRITE", "LEUKOCYTESUR" in the last 72 hours.  Invalid input(s): "APPERANCEUR"    Imaging: No results found.  Medications:    anticoagulant sodium citrate     magnesium  sulfate bolus IVPB      docusate sodium   100 mg Oral Daily   epoetin alfa-epbx (RETACRIT) injection  10,000 Units Intravenous Q T,Th,Sat-1800   famotidine   20 mg Oral QHS   furosemide   40 mg Oral Daily   heparin   2,000 Units Dialysis Once in dialysis   heparin   5,000 Units Subcutaneous Q8H   insulin  aspart  0-15 Units Subcutaneous TID WC   insulin  aspart  0-5 Units Subcutaneous QHS   sodium chloride  flush  3 mL Intravenous Q12H   acetaminophen , alteplase, alum & mag hydroxide-simeth, anticoagulant sodium citrate, feeding supplement (NEPRO CARB STEADY), gabapentin ,  guaiFENesin -dextromethorphan, heparin , heparin , hydrALAZINE , HYDROmorphone  (DILAUDID ) injection, HYDROmorphone , hydrOXYzine , labetalol , lidocaine  (PF), lidocaine -prilocaine, magnesium  sulfate bolus IVPB, metoprolol  tartrate, ondansetron , oxyCODONE , pentafluoroprop-tetrafluoroeth, phenol, polyethylene glycol, sodium chloride  flush, sorbitol  Assessment/ Plan:  [name] is a 48 y.o.  female  with past medical conditions including COPD, anemia, anxiety, bipolar disorder, polysubstance abuse, hypertension, GERD, tobacco abuse, and end-stage renal disease on hemodialysis, who was admitted to [facility] on 03/13/2024 for Osteomyelitis of left foot, unspecified type (HCC) [M86.9] Osteomyelitis of ankle and foot (HCC) [M86.9]   [facility] [place]/TTS/right aVF   End-stage renal disease on hemodialysis.  Last full treatment received on Tuesday 5/20. Patient was scheduled dialysis yesterday but infiltrated after and treatment terminated. Noted hematoma, warmth and swelling on AVF. Will ice and rest with tentative plan for HD tomorrow, as needed. Normal dialysis day TTS. Patient on furosemide  40mg  daily, will increase to 80mg  BID. Patient endorses good urine output.   2. Anemia of chronic kidney disease Recent Labs           Lab Results  Component Value Date    HGB 9.2 (L) 03/16/2024      Will give low dose ESA with scheduled dialysis.   3.  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  S/P vascular surgery performed on 5/21.  Pain management per primary team.   4. Secondary Hyperparathyroidism: with outpatient labs: None available Recent Labs           Lab Results  Component Value Date    CALCIUM  8.6 (L) 03/16/2024    CAION 1.10 (L) 03/13/2024    PHOS 4.4 03/16/2024      Calcium  within range.    LOS: 4 Shreyan Hinz Marisa Sickles 5/25/20252:14 PM

## 2024-03-18 LAB — GLUCOSE, CAPILLARY
Glucose-Capillary: 102 mg/dL — ABNORMAL HIGH (ref 70–99)
Glucose-Capillary: 128 mg/dL — ABNORMAL HIGH (ref 70–99)
Glucose-Capillary: 91 mg/dL (ref 70–99)
Glucose-Capillary: 132 mg/dL — ABNORMAL HIGH (ref 70–99)

## 2024-03-18 MED ORDER — ONDANSETRON HCL 4 MG PO TABS
4.0000 mg | ORAL_TABLET | Freq: Three times a day (TID) | ORAL | Status: DC | PRN
  Administered 2024-03-18 – 2024-03-19 (×2): 4 mg via ORAL
  Filled 2024-03-18 (×2): qty 1

## 2024-03-18 NOTE — Progress Notes (Signed)
 Unable to measure depth of heel wound. Appears to be open with depth.

## 2024-03-18 NOTE — Progress Notes (Signed)
 Patient received hemodialysis at a low BFR due to previous infiltration. Bruising and swelling is present with reduced space for cannulation. Patient advised to continue to apply ice to the avf to reduce bruising at the previous cannulation site.

## 2024-03-18 NOTE — Progress Notes (Addendum)
 Patient has signed off Hemodialysis early due to uncontrolled pain. Patient has received pain medication at 3 pm, but has not experienced relief from her pain.  Patient received 2 hours and 15 minutes of her scheduled hemodialysis treatment. Patient has requested to sign off early despite receiving education regarding early termination. Patient received her last full treatment on 03/14/2024.

## 2024-03-18 NOTE — Progress Notes (Signed)
  Progress Note    03/18/2024 7:51 AM 5 Days Post-Op  Subjective: Patient is postop day 5 from a left below-knee amputation.  Dressings clean dry and intact.  Patient states they have not changed it yet this morning but are planning to do so later.  Patient states she still having nausea and vomiting.  Patient endorses she threw up this morning when she woke up in the trash can next to the bed yet when I look in the trash can there is nothing there.  Patient continues to complain of pain.   Vitals:   03/17/24 2001 03/18/24 0419  BP: (!) 114/94 (!) 119/58  Pulse: 97 83  Resp: 20 20  Temp: 98.5 F (36.9 C) 97.6 F (36.4 C)  SpO2: 100% 96%   Physical Exam: Cardiac:  RRR, normal S1 and S2.  No rubs clicks gallops or murmurs noted. Lungs: Clear throughout on auscultation, no rales rhonchi or wheezing noted. Incisions: Left lower extremity BKA.  Dressings clean dry and intact.  No signs or symptoms of infection. Extremities: Left lower extremity BKA.  Right lower extremity warm to touch with palpable pulses. Abdomen: Positive bowel sounds throughout, soft, nontender and nondistended. Neurologic: Alert and oriented x 3, answers all questions and follows commands appropriately. CBC    Component Value Date/Time   WBC 7.7 03/16/2024 1551   RBC 2.90 (L) 03/16/2024 1551   HGB 9.2 (L) 03/16/2024 1551   HCT 28.6 (L) 03/16/2024 1551   PLT 364 03/16/2024 1551   MCV 98.6 03/16/2024 1551   MCH 31.7 03/16/2024 1551   MCHC 32.2 03/16/2024 1551   RDW 13.9 03/16/2024 1551   LYMPHSABS 1.1 03/06/2024 1347   MONOABS 0.5 03/06/2024 1347   EOSABS 0.1 03/06/2024 1347   BASOSABS 0.1 03/06/2024 1347    BMET    Component Value Date/Time   NA 133 (L) 03/16/2024 1551   K 3.6 03/16/2024 1551   CL 95 (L) 03/16/2024 1551   CO2 25 03/16/2024 1551   GLUCOSE 103 (H) 03/16/2024 1551   BUN 40 (H) 03/16/2024 1551   CREATININE 3.70 (H) 03/16/2024 1551   CREATININE 1.54 (H) 02/26/2019 1139   CALCIUM  8.6  (L) 03/16/2024 1551   GFRNONAA 14 (L) 03/16/2024 1551   GFRAA 52 (L) 04/02/2020 0457    INR    Component Value Date/Time   INR 1.7 (H) 01/24/2024 1222    No intake or output data in the 24 hours ending 03/18/24 0751   Assessment/Plan:  48 y.o. female is s/p left below the knee amputation.  5 Days Post-Op   Plan Patient is doing well and we will continue with her antinausea medicine and pain medicine.  She was instructed to eat and not take these medicines on an empty stomach.  Dressing changed later today by nursing. Plan for discharge tomorrow.  DVT prophylaxis: Heparin  2000 units with dialysis and heparin  5000 units subcu every 8 hours   [name] [facility] 03/18/2024 7:51 AM

## 2024-03-18 NOTE — Plan of Care (Signed)
  Problem: Health Behavior/Discharge Planning: Goal: Ability to identify and utilize available resources and services will improve Outcome: Progressing   Problem: Activity: Goal: Risk for activity intolerance will decrease Outcome: Progressing

## 2024-03-18 NOTE — Progress Notes (Signed)
 [facility]  ROUNDING NOTE   Subjective:   Patient states she needs dialysis today.   Complains of pain of her amputation site.  Complains of pain on her AVF  Nausea episode over night.    Objective:  Vital signs in last 24 hours:  Temp:  [97.6 F (36.4 C)-98.6 F (37 C)] 98.6 F (37 C) (05/26 0840) Pulse Rate:  [83-106] 95 (05/26 0840) Resp:  [17-20] 17 (05/26 0840) BP: (114-119)/(58-102) 114/80 (05/26 0840) SpO2:  [87 %-100 %] 99 % (05/26 0840)  Weight change:  Filed Weights   03/13/24 0628 03/16/24 1315  Weight: 59 kg 61 kg    Intake/Output: No intake/output data recorded.   Intake/Output this shift:  No intake/output data recorded.  Physical Exam: General: NAD, laying in bed  Head: Normocephalic, atraumatic. Moist oral mucosal membranes  Eyes: Anicteric, PERRL  Neck: Supple, trachea midline  Lungs:  Clear to auscultation  Heart: Regular rate and rhythm  Abdomen:  Soft, nontender,   Extremities:  No peripheral edema.Left BKA with clean and dry dressings  Neurologic: Nonfocal, moving all four extremities  Skin: No lesions  Access: Rt Avf + ecchymosis and edema    Basic Metabolic Panel: Recent Labs  Lab 03/13/24 0648 03/13/24 1032 03/15/24 0530 03/16/24 0526 03/16/24 1551  NA 137  --  124* 131* 133*  K 3.6  --  3.8 3.8 3.6  CL 98  --  90* 93* 95*  CO2  --   --  24 25 25   GLUCOSE 114*  --  126* 126* 103*  BUN 25*  --  38* 40* 40*  CREATININE 3.00* 2.85* 3.56* 3.75* 3.70*  CALCIUM   --   --  7.9* 8.4* 8.6*  PHOS  --   --   --   --  4.4    Liver Function Tests: Recent Labs  Lab 03/16/24 1551  ALBUMIN 3.3*   No results for input(s): "LIPASE", "AMYLASE" in the last 168 hours. No results for input(s): "AMMONIA" in the last 168 hours.  CBC: Recent Labs  Lab 03/13/24 0648 03/13/24 1032 03/15/24 0530 03/16/24 0526 03/16/24 1551  WBC  --  12.0* 7.5 6.8 7.7  HGB 10.9* 9.1* 7.9* 7.4* 9.2*  HCT 32.0* 27.1* 23.9* 23.0* 28.6*   MCV  --  97.5 97.2 97.5 98.6  PLT  --  331 293 290 364    Cardiac Enzymes: No results for input(s): "CKTOTAL", "CKMB", "CKMBINDEX", "TROPONINI" in the last 168 hours.  BNP: Invalid input(s): "POCBNP"  CBG: Recent Labs  Lab 03/17/24 0735 03/17/24 1115 03/17/24 1542 03/17/24 2055 03/18/24 0842  GLUCAP 141* 105* 183* 84 102*    Microbiology: Results for orders placed or performed during the hospital encounter of 03/06/24  Surgical pcr screen     Status: None   Collection Time: 03/06/24  1:47 PM   Specimen: Nasal Mucosa; Nasal Swab  Result Value Ref Range Status   MRSA, PCR NEGATIVE NEGATIVE Final   Staphylococcus aureus NEGATIVE NEGATIVE Final    Comment: (NOTE) The Xpert SA Assay (FDA approved for NASAL specimens in patients 40 years of age and older), is one component of a comprehensive surveillance program. It is not intended to diagnose infection nor to guide or monitor treatment. Performed at [facility], [address] [place], [place] [address]     Coagulation Studies: No results for input(s): "LABPROT", "INR" in the last 72 hours.  Urinalysis: No results for input(s): "COLORURINE", "LABSPEC", "PHURINE", "GLUCOSEU", "HGBUR", "BILIRUBINUR", "KETONESUR", "PROTEINUR", "  UROBILINOGEN", "NITRITE", "LEUKOCYTESUR" in the last 72 hours.  Invalid input(s): "APPERANCEUR"    Imaging: No results found.   Medications:    anticoagulant sodium citrate     magnesium  sulfate bolus IVPB      docusate sodium   100 mg Oral Daily   epoetin alfa-epbx (RETACRIT) injection  10,000 Units Intravenous Q T,Th,Sat-1800   famotidine   20 mg Oral QHS   furosemide   80 mg Oral BID   heparin   2,000 Units Dialysis Once in dialysis   heparin   5,000 Units Subcutaneous Q8H   insulin  aspart  0-15 Units Subcutaneous TID WC   insulin  aspart  0-5 Units Subcutaneous QHS   sodium chloride  flush  3 mL Intravenous Q12H   acetaminophen , alteplase, alum & mag hydroxide-simeth,  anticoagulant sodium citrate, feeding supplement (NEPRO CARB STEADY), gabapentin , guaiFENesin -dextromethorphan, heparin , heparin , hydrALAZINE , HYDROmorphone  (DILAUDID ) injection, HYDROmorphone , hydrOXYzine , labetalol , lidocaine  (PF), lidocaine -prilocaine, magnesium  sulfate bolus IVPB, metoprolol  tartrate, ondansetron , oxyCODONE , pentafluoroprop-tetrafluoroeth, phenol, polyethylene glycol, sodium chloride  flush, sorbitol  Assessment/ Plan:  Ms. ALANDRA SANDO is a 48 y.o.  female  with past medical conditions including COPD, anemia, anxiety, bipolar disorder, polysubstance abuse, hypertension, GERD, tobacco abuse, and end-stage renal disease on hemodialysis, who was admitted to [facility] on 03/13/2024 for Osteomyelitis of left foot, unspecified type (HCC) [M86.9] Osteomyelitis of ankle and foot (HCC) [M86.9]   [facility] [place]/TTS/right aVF   End-stage renal disease on hemodialysis.  Last full treatment received on Tuesday 5/20. Patient was scheduled dialysis Saturday but infiltrated after and treatment terminated. Noted hematoma, warmth and swelling on AVF.  - short dialysis treatment today.  - resume TTS schedule - will continue aggressive furosemide    2. Anemia of chronic kidney disease Recent Labs           Lab Results  Component Value Date    HGB 9.2 (L) 03/16/2024      ESA with HD treatments   3.  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  S/P vascular surgery performed on 5/21.      4. Secondary Hyperparathyroidism:  Recent Labs           Lab Results  Component Value Date    CALCIUM  8.6 (L) 03/16/2024    CAION 1.10 (L) 03/13/2024    PHOS 4.4 03/16/2024      Calcium  and phosphorus at goal.   5. Hypertension: 114/94.   - furosemide  80mg  bid.    LOS: 5 Kareena Arrambide 5/26/202510:37 AM

## 2024-03-18 NOTE — Progress Notes (Signed)
 Appears to be tunneled. Unable to measure depth without opening the wound

## 2024-03-18 NOTE — Consult Note (Addendum)
 WOC Nurse Consult Note: patient known to [facility] team from previous admissions; has had longstanding R calcaneus wound followed at [facility] last seen 02/15/2024 and prescribed Hydrofera Blue, Hydrofera Blue is not on formulary at [facility] and wound bed appears dry eschar; will utilize Xeroform for continued antibacterial and drying effect   Has undergone L BKA this admission; followed by vascular; appears to have had vascular intervention on R leg in the past  Reason for Consult: R heel wound  Wound type: full thickness diabetic ulcer  Pressure Injury POA: NA not related to pressure  Measurement: see nursing flowsheet  Wound ZOX:WRUEA dry tissue with some dark eschar noted at bottom  Drainage (amount, consistency, odor) appears dry, see nursing flowsheet  Periwound: hyperkeratotic  Dressing procedure/placement/frequency: Cleanse R heel wound with Vashe wound cleanser Timm Foot [phone]) do not rinse and allow to dry. Apply Xeroform gauze ([place] [phone]) to wound bed daily, cover with dry gauze and Kerlix or silicone foam whichever patient prefers.   Patient should resume care at wound care center for debridement of dry callus tissue and ongoing management of this wound.    POC discussed with bedside nurse. Per bedside there appears to be a very small tunnel under the eschar tissue but too small for even a Q tip end to facilitate packing.  WOC team will not follow. Re-consult if further needs arise.   Thank you,    Ronni Colace MSN, RN-BC, [employer] [phone]

## 2024-03-19 ENCOUNTER — Other Ambulatory Visit: Payer: Self-pay

## 2024-03-19 LAB — GLUCOSE, CAPILLARY
Glucose-Capillary: 121 mg/dL — ABNORMAL HIGH (ref 70–99)
Glucose-Capillary: 155 mg/dL — ABNORMAL HIGH (ref 70–99)

## 2024-03-19 LAB — HEPATITIS B SURFACE ANTIBODY, QUANTITATIVE: Hep B S AB Quant (Post): 89.7 m[IU]/mL

## 2024-03-19 MED ORDER — OXYCODONE-ACETAMINOPHEN 5-325 MG PO TABS
1.0000 | ORAL_TABLET | ORAL | 0 refills | Status: AC | PRN
  Filled 2024-03-19: qty 30, 5d supply, fill #0

## 2024-03-19 MED ORDER — PROMETHAZINE HCL 25 MG PO TABS
25.0000 mg | ORAL_TABLET | Freq: Four times a day (QID) | ORAL | 0 refills | Status: AC | PRN
  Filled 2024-03-19: qty 30, 8d supply, fill #0

## 2024-03-19 NOTE — Progress Notes (Signed)
 [facility]  ROUNDING NOTE   Subjective:   Patient requesting discharge today Continues to complains of soreness at left upper arm  Refuses dialysis today.   Objective:  Vital signs in last 24 hours:  Temp:  [97.9 F (36.6 C)-98.9 F (37.2 C)] 97.9 F (36.6 C) (05/27 0732) Pulse Rate:  [67-93] 93 (05/27 0732) Resp:  [8-20] 15 (05/27 0732) BP: (85-155)/(56-89) 138/89 (05/27 0732) SpO2:  [90 %-100 %] 99 % (05/27 0732) Weight:  [59 kg-59.7 kg] 59 kg (05/26 1554)  Weight change:  Filed Weights   03/16/24 1315 03/18/24 1237 03/18/24 1554  Weight: 61 kg 59.7 kg 59 kg    Intake/Output: I/O last 3 completed shifts: In: 0  Out: 700 [Other:700]   Intake/Output this shift:  Total I/O In: 120 [P.O.:120] Out: -   Physical Exam: General: NAD, laying in bed  Head: Normocephalic, atraumatic. Moist oral mucosal membranes  Eyes: Anicteric  Lungs:  Clear to auscultation  Heart: Regular rate and rhythm  Abdomen:  Soft, nontender,   Extremities:  No peripheral edema.Left BKA with clean and dry dressings  Neurologic: Alert, moving all four extremities  Skin: No lesions  Access: Rt Avf + ecchymosis and edema    Basic Metabolic Panel: Recent Labs  Lab 03/13/24 0648 03/13/24 1032 03/15/24 0530 03/16/24 0526 03/16/24 1551  NA 137  --  124* 131* 133*  K 3.6  --  3.8 3.8 3.6  CL 98  --  90* 93* 95*  CO2  --   --  24 25 25   GLUCOSE 114*  --  126* 126* 103*  BUN 25*  --  38* 40* 40*  CREATININE 3.00* 2.85* 3.56* 3.75* 3.70*  CALCIUM   --   --  7.9* 8.4* 8.6*  PHOS  --   --   --   --  4.4    Liver Function Tests: Recent Labs  Lab 03/16/24 1551  ALBUMIN 3.3*   No results for input(s): "LIPASE", "AMYLASE" in the last 168 hours. No results for input(s): "AMMONIA" in the last 168 hours.  CBC: Recent Labs  Lab 03/13/24 0648 03/13/24 1032 03/15/24 0530 03/16/24 0526 03/16/24 1551  WBC  --  12.0* 7.5 6.8 7.7  HGB 10.9* 9.1* 7.9* 7.4* 9.2*  HCT 32.0*  27.1* 23.9* 23.0* 28.6*  MCV  --  97.5 97.2 97.5 98.6  PLT  --  331 293 290 364    Cardiac Enzymes: No results for input(s): "CKTOTAL", "CKMB", "CKMBINDEX", "TROPONINI" in the last 168 hours.  BNP: Invalid input(s): "POCBNP"  CBG: Recent Labs  Lab 03/18/24 1137 03/18/24 1649 03/18/24 2058 03/19/24 0734 03/19/24 1153  GLUCAP 128* 91 132* 121* 155*    Microbiology: Results for orders placed or performed during the hospital encounter of 03/06/24  Surgical pcr screen     Status: None   Collection Time: 03/06/24  1:47 PM   Specimen: Nasal Mucosa; Nasal Swab  Result Value Ref Range Status   MRSA, PCR NEGATIVE NEGATIVE Final   Staphylococcus aureus NEGATIVE NEGATIVE Final    Comment: (NOTE) The Xpert SA Assay (FDA approved for NASAL specimens in patients 67 years of age and older), is one component of a comprehensive surveillance program. It is not intended to diagnose infection nor to guide or monitor treatment. Performed at [facility], [address] [place], [place] [address]     Coagulation Studies: No results for input(s): "LABPROT", "INR" in the last 72 hours.  Urinalysis: No results for input(s): "COLORURINE", "  LABSPEC", "PHURINE", "GLUCOSEU", "HGBUR", "BILIRUBINUR", "KETONESUR", "PROTEINUR", "UROBILINOGEN", "NITRITE", "LEUKOCYTESUR" in the last 72 hours.  Invalid input(s): "APPERANCEUR"    Imaging: No results found.   Medications:    magnesium  sulfate bolus IVPB      docusate sodium   100 mg Oral Daily   epoetin alfa-epbx (RETACRIT) injection  10,000 Units Intravenous Q T,Th,Sat-1800   famotidine   20 mg Oral QHS   furosemide   80 mg Oral BID   heparin   5,000 Units Subcutaneous Q8H   insulin  aspart  0-15 Units Subcutaneous TID WC   insulin  aspart  0-5 Units Subcutaneous QHS   sodium chloride  flush  3 mL Intravenous Q12H   acetaminophen , alum & mag hydroxide-simeth, gabapentin , guaiFENesin -dextromethorphan, hydrALAZINE , HYDROmorphone   (DILAUDID ) injection, HYDROmorphone , hydrOXYzine , labetalol , magnesium  sulfate bolus IVPB, metoprolol  tartrate, ondansetron , oxyCODONE , phenol, polyethylene glycol, sodium chloride  flush, sorbitol  Assessment/ Plan:  [name] is a 48 y.o.  female  with past medical conditions including COPD, anemia, anxiety, bipolar disorder, polysubstance abuse, hypertension, GERD, tobacco abuse, and end-stage renal disease on hemodialysis, who was admitted to [facility] on 03/13/2024 for Osteomyelitis of left foot, unspecified type (HCC) [M86.9] Osteomyelitis of ankle and foot (HCC) [M86.9]   [facility] [place]/TTS/right aVF   End-stage renal disease on hemodialysis.  Last full treatment received on Tuesday 5/20. Patient was scheduled dialysis Saturday but infiltrated after and treatment terminated. Noted hematoma, warmth and swelling on AVF.  - Received short dialysis treatment yesterday, terminated treatment early due to pain. - Treatment was scheduled today per outpatient schedule however patient refused due to continued access discomfort. -Patient encouraged to continue to apply ice to access to reduce bruising and edema. - will continue aggressive furosemide  -Will defer discharge plan to primary team.   2. Anemia of chronic kidney disease Recent Labs           Lab Results  Component Value Date    HGB 9.2 (L) 03/16/2024      Patient will can continue Mircera as outpatient.   3.  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  S/P vascular surgery performed on 5/21.   Pain management.   4. Secondary Hyperparathyroidism:  Recent Labs           Lab Results  Component Value Date    CALCIUM  8.6 (L) 03/16/2024    CAION 1.10 (L) 03/13/2024    PHOS 4.4 03/16/2024      Will continue monitor bone minerals during this admission.  5. Hypertension with chronic kidney disease.:  - Blood pressure stable for this patient.  - furosemide  80mg  bid.    LOS: 6 Taraya Steward 5/27/202512:13 PM

## 2024-03-19 NOTE — Discharge Summary (Signed)
 [facility]    Discharge Summary    Patient ID:  [name] MRN: [id number] DOB/AGE: 11-20-1975 47 y.o.  Admit date: 03/13/2024 Discharge date: 03/19/2024 Date of Surgery: 03/13/2024 Surgeon: Surgeon(s): Schnier, Ninette Basque, MD  Admission Diagnosis: Osteomyelitis of left foot, unspecified type [facility]) [M86.9] Osteomyelitis of ankle and foot (HCC) [M86.9]  Discharge Diagnoses:  Osteomyelitis of left foot, unspecified type (HCC) [M86.9] Osteomyelitis of ankle and foot (HCC) [M86.9]  Secondary Diagnoses: Past Medical History:  Diagnosis Date   Anemia    Anxiety    Bipolar disorder (HCC)    Cervical cancer (HCC)    Chronic combined systolic and diastolic CHF (congestive heart failure) (HCC)    a. ECHO 04/23/14 LV EF: 45% - 50%,  mild LVH, diffuse hypokinesis. LV diastolic dysfunction, high ventricular filling pressure. Mild LA dilation.   Cocaine abuse (HCC)    Complication of anesthesia    a.) delayed/prolonged emergence   COPD (chronic obstructive pulmonary disease) (HCC)    Deep vein thrombosis (DVT) of popliteal vein of left lower extremity (HCC)    Depression    Diabetes mellitus without complication (HCC)    Diarrhea    Dyspnea    ESRD on hemodialysis (HCC)    GERD (gastroesophageal reflux disease)    Heroin overdose (HCC) 10/2018   History of kidney stones    Hypertension    Intractable nausea and vomiting 02/18/2024   Mitral stenosis    MRSA infection    arm wound   Nicotine  dependence with current use    Pneumonia    Seizures (HCC)    last seizure March 2021; usual 1-2 per month   Stroke [facility])    mini stroke when on drugs    Procedure(s): AMPUTATION BELOW KNEE  Discharged Condition: good  HPI:  [name] is a 48 yo female now POD #5 from left BKA. She is recovering as expected. No complications from her surgery to note. Patient to follow up as scheduled in 3 weeks for staple removal.   Hospital Course:  [name]  is a 48 y.o. female is S/P Left BKA. Patient recovered as expected. Patient discharged to home today. Will follow up as scheduled.   I spent greater than 60 minutes in completing this discharge with the patient due to dressing change and instruction.    Extubated: POD # 0 Physical Exam:  Alert notes x3, no acute distress Face: Symmetrical.  Tongue is midline. Neck: Trachea is midline.  No swelling or bruising. Cardiovascular: Regular rate and rhythm Pulmonary: Clear to auscultation bilaterally Abdomen: Soft, nontender, nondistended Right groin access: Clean dry and intact.  No swelling or drainage noted Left groin access: Clean dry and intact.  No swelling or drainage noted Left lower extremity: Thigh soft.  Calf soft.  Extremities warm to stump. New BKA with staples clean dry and intact.  Right lower extremity: Thigh soft.  Calf soft.  Extremities warm distally toes.  Hard to palpate pedal pulses however the foot is warm is her good capillary refill. Neurological: No deficits noted   Post-op wounds:  clean, dry, intact or healing well  Pt. Ambulating, voiding and taking PO diet without difficulty. Pt pain controlled with PO pain meds.  Labs:  As below  Complications: none  Consults:    Significant Diagnostic Studies: CBC Lab Results  Component Value Date   WBC 7.7 03/16/2024   HGB 9.2 (L) 03/16/2024   HCT 28.6 (L) 03/16/2024   MCV 98.6  03/16/2024   PLT 364 03/16/2024    BMET    Component Value Date/Time   NA 133 (L) 03/16/2024 1551   K 3.6 03/16/2024 1551   CL 95 (L) 03/16/2024 1551   CO2 25 03/16/2024 1551   GLUCOSE 103 (H) 03/16/2024 1551   BUN 40 (H) 03/16/2024 1551   CREATININE 3.70 (H) 03/16/2024 1551   CREATININE 1.54 (H) 02/26/2019 1139   CALCIUM  8.6 (L) 03/16/2024 1551   GFRNONAA 14 (L) 03/16/2024 1551   GFRAA 52 (L) 04/02/2020 0457   COAG Lab Results  Component Value Date   INR 1.7 (H) 01/24/2024   INR 1.2 10/06/2020   INR 1.1 03/31/2020      Disposition:  Discharge to :Home  Allergies as of 03/19/2024       Reactions   Albumin (human)    unknown   Erythromycin Rash   Midecamycin Rash        Medication List     TAKE these medications    acetaminophen  325 MG tablet Commonly known as: TYLENOL  Take 2 tablets (650 mg total) by mouth every 6 (six) hours as needed for mild pain (or Fever >/= 101).   amoxicillin -clavulanate 500-125 MG tablet Commonly known as: AUGMENTIN  Take 1 tablet by mouth. Unknown frequency.   apixaban  5 MG Tabs tablet Commonly known as: ELIQUIS  Take 5 mg by mouth 2 (two) times daily.   aspirin  EC 81 MG tablet Take 81 mg by mouth daily. Swallow whole.   atorvastatin  40 MG tablet Commonly known as: LIPITOR Take 40 mg by mouth daily.   azelastine 0.1 % nasal spray Commonly known as: ASTELIN Place 2 sprays into both nostrils 2 (two) times daily. Use 1-2 sprays in each nostril as directed   doxycycline  100 MG capsule Commonly known as: VIBRAMYCIN  Take 100 mg by mouth 2 (two) times daily. For 7 days.   escitalopram 10 MG tablet Commonly known as: LEXAPRO Take 10 mg by mouth daily.   furosemide  80 MG tablet Commonly known as: LASIX  Take 80 mg by mouth 2 (two) times daily.   gabapentin  600 MG tablet Commonly known as: NEURONTIN  Take 600 mg by mouth at bedtime as needed (pt. states she takes 2 600mg  tables at bedtime.).   gabapentin  600 MG tablet Commonly known as: NEURONTIN  Take 600 mg by mouth daily. Pt. States she takes 1 tablet during the AM.   hydrOXYzine  25 MG tablet Commonly known as: ATARAX  Take 25 mg by mouth at bedtime as needed for anxiety (pt. states she takes two 25mg  tablets at bedtime for insomnia and anxiety.).   oxyCODONE -acetaminophen  5-325 MG tablet Commonly known as: Percocet Take 1 tablet by mouth every 6 (six) hours as needed for severe pain (pain score 7-10) or moderate pain (pain score 4-6). What changed: Another medication with the same name was  added. Make sure you understand how and when to take each.   oxyCODONE -acetaminophen  5-325 MG tablet Commonly known as: Percocet Take 1 tablet by mouth every 4 (four) hours as needed for severe pain (pain score 7-10) or moderate pain (pain score 4-6). What changed: You were already taking a medication with the same name, and this prescription was added. Make sure you understand how and when to take each.   pantoprazole  20 MG tablet Commonly known as: PROTONIX  Take 20 mg by mouth as needed.   promethazine  25 MG tablet Commonly known as: PHENERGAN  Take 1 tablet (25 mg total) by mouth every 6 (six) hours as needed for nausea  or vomiting.   RETACRIT IJ Epoetin alfa - epbx (Retacrit)       Verbal and written Discharge instructions given to the patient. Wound care per Discharge AVS  Follow-up Information     Schnier, Ninette Basque, MD Follow up in 3 week(s).   Specialties: Vascular Surgery, Cardiology, Radiology, Vascular Surgery Why: Follow up Post Op Will need staple removal Contact information: [address] [place] [address] [phone]                 Signed: [name], NP  03/19/2024, 10:50 AM

## 2024-03-19 NOTE — Discharge Instructions (Addendum)
 Vascular Surgery Post Operative instruction.   Do not lift anything heavy. Do not lift more than a gallon of milk.  Do not fall on your amputated leg as this will result in further amputation.   You may shower but you must cover your amputation site making it water proof. After showering keep your stump very dry.  You are being discharged on pain medication. If this is not adequate return to the emergency room for further pain evaluation.   Dressing to your amputation must be changed daily as follows:  Remove old dressing  Cover staple line with Zeroform Guaze then  Cover with 4x4 guaze then   Cover with ABD pads then  Wrap with Kerlex then  Wrap with ACE bandage and make it snug to control edema.   Follow up with Vascular Surgery in 3 weeks as scheduled to have your staples removed.    outpatient therapy referral faxed to [facility]

## 2024-03-20 ENCOUNTER — Ambulatory Visit: Payer: MEDICAID | Admitting: Physician Assistant

## 2024-03-20 ENCOUNTER — Telehealth (INDEPENDENT_AMBULATORY_CARE_PROVIDER_SITE_OTHER): Payer: Self-pay

## 2024-03-20 ENCOUNTER — Other Ambulatory Visit (INDEPENDENT_AMBULATORY_CARE_PROVIDER_SITE_OTHER): Payer: Self-pay | Admitting: Nurse Practitioner

## 2024-03-20 NOTE — Telephone Encounter (Signed)
 Patient left message on voicemail stating she has blisters above the incision and redness on left bka. She noticed the blisters after removing wrap. Please Advise

## 2024-03-20 NOTE — Telephone Encounter (Signed)
 Patient had a BKA 03/13/24 and isin more pain than she thought she would be in. Patient was prescribed Oxycodone  when discharged. Patient is requesting more pain meds. She stated she was taking 2 at time every 4 hours like at the hospital for pain, but the bottle say 1 tablet every 4 hrs and its not working.  She is also concerned about her Left amputation being is "red and very hot". She stated that she thinks it's because of the Xeroform. She also wanted inform you that she is not on any Abx.   Please advise

## 2024-03-21 NOTE — Telephone Encounter (Signed)
 Let's see if she can come in to see Brien, Dr. Prescilla Brod or myself in the next couple of days for a wound check.  Has had a previous patient, we adhered to ensuring that the patient takes her medication as directed and typically will not send in additional pain medicine until it is time to do so.  If she is coming in within the next few days we can talk about her pain medication regimen at that time.  As far as antibiotics we will determine that once we see the need of antibiotics unnecessarily.  We typically do not place patients prophylactically on antibiotics as that does not enhance wound healing in fact they can actually cause issues in the long run.

## 2024-03-21 NOTE — Telephone Encounter (Signed)
 Patient was notified with medical recommendations and verbalized understanding. Patient was informed that the front receptionist will contact to scheduled an appointment for this week

## 2024-03-22 ENCOUNTER — Ambulatory Visit (INDEPENDENT_AMBULATORY_CARE_PROVIDER_SITE_OTHER): Payer: MEDICAID | Admitting: Vascular Surgery

## 2024-03-22 ENCOUNTER — Encounter (INDEPENDENT_AMBULATORY_CARE_PROVIDER_SITE_OTHER): Payer: Self-pay | Admitting: Vascular Surgery

## 2024-03-22 VITALS — BP 144/85 | HR 69 | Resp 18

## 2024-03-22 DIAGNOSIS — M79662 Pain in left lower leg: Secondary | ICD-10-CM

## 2024-03-22 DIAGNOSIS — Z4889 Encounter for other specified surgical aftercare: Secondary | ICD-10-CM

## 2024-03-22 MED ORDER — OXYCODONE HCL 5 MG PO CAPS: 5.0000 mg | ORAL_CAPSULE | ORAL | 0 refills | Status: DC | PRN

## 2024-03-22 MED ORDER — GABAPENTIN 300 MG PO CAPS: 300.0000 mg | ORAL_CAPSULE | Freq: Two times a day (BID) | ORAL | 3 refills | Status: AC

## 2024-03-22 NOTE — Progress Notes (Signed)
 Subjective:    Patient ID: [name], female    DOB: 12-Sep-1976, 48 y.o.   MRN: [id number] Chief Complaint  Patient presents with   Follow-up    fu wound check     [name] is a 48 yo female now post operative from left below the knee amputation.  She presents to clinic today concerned about the erythema to her incision line.  She is having continued pain to the stump.  She also endorses having nausea with taking the pain medicine but she was given Phenergan  upon discharge.  I informed her today to follow-up with her PCP for the use of Phenergan  as that had to be closely monitored.  She is not due to have her staples removed today.  She denies any fevers or chills.  She denies any drainage from the incision line.  She endorses she has not been compliant with using the Ace wrap to help with swelling as she says it is just too painful.    Review of Systems  Constitutional: Negative.   Respiratory: Negative.    Cardiovascular: Negative.   Gastrointestinal: Negative.   Genitourinary: Negative.   Musculoskeletal:  Positive for gait problem.       Left lower BKA  Skin:  Positive for wound.       Left lower BKA  Psychiatric/Behavioral:  The patient is nervous/anxious.   All other systems reviewed and are negative.      Objective:    Physical Exam Constitutional:      Appearance: Normal appearance.  HENT:     Head: Normocephalic.  Eyes:     Pupils: Pupils are equal, round, and reactive to light.  Cardiovascular:     Rate and Rhythm: Normal rate and regular rhythm.     Pulses: Normal pulses.     Heart sounds: Normal heart sounds.  Pulmonary:     Effort: Pulmonary effort is normal.     Breath sounds: Normal breath sounds.  Abdominal:     General: Abdomen is flat. Bowel sounds are normal.     Palpations: Abdomen is soft.  Musculoskeletal:        General: Deformity present.     Comments: Patient now with left below-knee amputation.  Skin:    General: Skin is warm  and dry.  Neurological:     General: No focal deficit present.     Mental Status: She is alert and oriented to person, place, and time. Mental status is at baseline.     Sensory: Sensory deficit present.     Comments: Patient with phantom pain to the left lower extremity.  Psychiatric:        Mood and Affect: Mood normal.        Behavior: Behavior normal.        Thought Content: Thought content normal.        Judgment: Judgment normal.     BP (!) 144/85   Pulse 69   Resp 18   LMP  (LMP Unknown)   Past Medical History:  Diagnosis Date   Anemia    Anxiety    Bipolar disorder (HCC)    Cervical cancer (HCC)    Chronic combined systolic and diastolic CHF (congestive heart failure) (HCC)    a. ECHO 04/23/14 LV EF: 45% - 50%,  mild LVH, diffuse hypokinesis. LV diastolic dysfunction, high ventricular filling pressure. Mild LA dilation.   Cocaine abuse (HCC)    Complication of anesthesia    a.) delayed/prolonged emergence  COPD (chronic obstructive pulmonary disease) (HCC)    Deep vein thrombosis (DVT) of popliteal vein of left lower extremity (HCC)    Depression    Diabetes mellitus without complication (HCC)    Diarrhea    Dyspnea    ESRD on hemodialysis (HCC)    GERD (gastroesophageal reflux disease)    Heroin overdose (HCC) 10/2018   History of kidney stones    Hypertension    Intractable nausea and vomiting 02/18/2024   Mitral stenosis    MRSA infection    arm wound   Nicotine  dependence with current use    Pneumonia    Seizures (HCC)    last seizure March 2021; usual 1-2 per month   Stroke [facility])    mini stroke when on drugs    Social History   Socioeconomic History   Marital status: Single    Spouse name: Not on file   Number of children: 0   Years of education: 12   Highest education level: Not on file  Occupational History   Occupation: Unemployed  Tobacco Use   Smoking status: Every Day    Current packs/day: 1.00    Average packs/day: 1 pack/day for  28.0 years (28.0 ttl pk-yrs)    Types: Cigarettes   Smokeless tobacco: Never  Vaping Use   Vaping status: Never Used  Substance and Sexual Activity   Alcohol use: Not Currently    Alcohol/week: 0.0 standard drinks of alcohol    Comment: Quit drinking 11/16 per pt   Drug use: Yes    Types: Marijuana    Comment: daily   Sexual activity: Yes  Other Topics Concern   Not on file  Social History Narrative   Lives at home with mother.    Caffeine use: Soda (drinks 3L per day)   Social Drivers of Corporate investment banker Strain: Not on file  Food Insecurity: No Food Insecurity (03/13/2024)   Hunger Vital Sign    Worried About Running Out of Food in the Last Year: Never true    Ran Out of Food in the Last Year: Never true  Transportation Needs: No Transportation Needs (03/13/2024)   PRAPARE - Administrator, Civil Service (Medical): No    Lack of Transportation (Non-Medical): No  Physical Activity: Not on file  Stress: Not on file  Social Connections: Not on file  Intimate Partner Violence: Not At Risk (03/13/2024)   Humiliation, Afraid, Rape, and Kick questionnaire    Fear of Current or Ex-Partner: No    Emotionally Abused: No    Physically Abused: No    Sexually Abused: No    Past Surgical History:  Procedure Laterality Date   ABDOMINAL HYSTERECTOMY     ACHILLES TENDON SURGERY Left 03/31/2020   Procedure: ACHILLES LENGTHENING;  Surgeon: Pink Bridges, DPM;  Location: [facility] ORS;  Service: Podiatry;  Laterality: Left;   AMPUTATION Right 10/04/2020   Procedure: PARTIAL AMPUTATION RAY 4th and 5th;  Surgeon: Pink Bridges, DPM;  Location: [facility] ORS;  Service: Podiatry;  Laterality: Right;   AMPUTATION Left 03/13/2024   Procedure: AMPUTATION BELOW KNEE;  Surgeon: Jackquelyn Mass, MD;  Location: [facility] ORS;  Service: Vascular;  Laterality: Left;   AMPUTATION TOE Left 03/23/2019   Procedure: AMPUTATION TOE GREAT LEFT TOE;  Surgeon: Sharlyn Deaner, DPM;  Location: [facility]  ORS;  Service: Podiatry;  Laterality: Left;   AMPUTATION TOE Left 08/16/2019   Procedure: AMPUTATION TOE 5th;  Surgeon: Pink Bridges, DPM;  Location: [facility]  ORS;  Service: Podiatry;  Laterality: Left;   AMPUTATION TOE Left 02/05/2020   Procedure: AMPUTATION MPJ LEFT 4TH;  Surgeon: Pink Bridges, DPM;  Location: [facility] ORS;  Service: Podiatry;  Laterality: Left;   ANKLE FRACTURE SURGERY Left    pins/screws in place   AV FISTULA PLACEMENT  2025   BUBBLE STUDY  01/08/2021   Procedure: BUBBLE STUDY;  Surgeon: Lenise Quince, MD;  Location: [facility] ENDOSCOPY;  Service: Cardiovascular;;   CHOLECYSTECTOMY  2005   FRACTURE SURGERY     IRRIGATION AND DEBRIDEMENT FOOT Left 10/04/2020   Procedure: IRRIGATION AND DEBRIDEMENT FOOT;  Surgeon: Pink Bridges, DPM;  Location: [facility] ORS;  Service: Podiatry;  Laterality: Left;   LOWER EXTREMITY ANGIOGRAPHY Left 03/12/2019   Procedure: LOWER EXTREMITY ANGIOGRAPHY;  Surgeon: Jackquelyn Mass, MD;  Location: [facility] INVASIVE CV LAB;  Service: Cardiovascular;  Laterality: Left;   LOWER EXTREMITY ANGIOGRAPHY Right 10/06/2020   Procedure: Lower Extremity Angiography;  Surgeon: Jackquelyn Mass, MD;  Location: [facility] INVASIVE CV LAB;  Service: Cardiovascular;  Laterality: Right;   TEE WITHOUT CARDIOVERSION N/A 03/26/2019   Procedure: TRANSESOPHAGEAL ECHOCARDIOGRAM (TEE);  Surgeon: Sammy Crisp, MD;  Location: [facility] ORS;  Service: Cardiovascular;  Laterality: N/A;   TEE WITHOUT CARDIOVERSION N/A 01/08/2021   Procedure: TRANSESOPHAGEAL ECHOCARDIOGRAM (TEE);  Surgeon: Lenise Quince, MD;  Location[facility] ENDOSCOPY;  Service: Cardiovascular;  Laterality: N/A;   TRANSMETATARSAL AMPUTATION Left 03/31/2020   Procedure: TRANSMETATARSAL AMPUTATION;  Surgeon: Pink Bridges, DPM;  Location: [facility] ORS;  Service: Podiatry;  Laterality: Left;   TUBAL LIGATION      Family History  Problem Relation Age of Onset   Hypertension Other    Drug abuse Mother    Seizures Neg Hx      Allergies  Allergen Reactions   Albumin (Human)     unknown   Erythromycin Rash   Midecamycin Rash       Latest Ref Rng & Units 03/16/2024    3:51 PM 03/16/2024    5:26 AM 03/15/2024    5:30 AM  CBC  WBC 4.0 - 10.5 K/uL 7.7  6.8  7.5   Hemoglobin 12.0 - 15.0 g/dL 9.2  7.4  7.9   Hematocrit 36.0 - 46.0 % 28.6  23.0  23.9   Platelets 150 - 400 K/uL 364  290  293        CMP     Component Value Date/Time   NA 133 (L) 03/16/2024 1551   K 3.6 03/16/2024 1551   CL 95 (L) 03/16/2024 1551   CO2 25 03/16/2024 1551   GLUCOSE 103 (H) 03/16/2024 1551   BUN 40 (H) 03/16/2024 1551   CREATININE 3.70 (H) 03/16/2024 1551   CREATININE 1.54 (H) 02/26/2019 1139   CALCIUM  8.6 (L) 03/16/2024 1551   PROT 7.9 02/18/2024 0845   ALBUMIN 3.3 (L) 03/16/2024 1551   AST 24 02/18/2024 0845   ALT 19 02/18/2024 0845   ALKPHOS 110 02/18/2024 0845   BILITOT 0.6 02/18/2024 0845   GFRNONAA 14 (L) 03/16/2024 1551     No results found.     Assessment & Plan:    1. Encounter for post surgical wound check (Primary) Patient presents today concerned for wound issues. Patient with erythema along the staple line. No infection, seroma or hematoma noted today. Will see back as scheduled for staple removal in 2 weeks.  2. Pain of left lower leg Pain medication Prescribed today to help with post operative pain. Patient counseled to take as  prescribed.    Current Outpatient Medications on File Prior to Visit  Medication Sig Dispense Refill   acetaminophen  (TYLENOL ) 325 MG tablet Take 2 tablets (650 mg total) by mouth every 6 (six) hours as needed for mild pain (or Fever >/= 101). 30 tablet 0   amoxicillin -clavulanate (AUGMENTIN ) 500-125 MG tablet Take 1 tablet by mouth. Unknown frequency.     apixaban  (ELIQUIS ) 5 MG TABS tablet Take 5 mg by mouth 2 (two) times daily.     aspirin  EC 81 MG tablet Take 81 mg by mouth daily. Swallow whole.     atorvastatin  (LIPITOR) 40 MG tablet Take 40 mg by mouth  daily.     azelastine (ASTELIN) 0.1 % nasal spray Place 2 sprays into both nostrils 2 (two) times daily. Use 1-2 sprays in each nostril as directed     doxycycline  (VIBRAMYCIN ) 100 MG capsule Take 100 mg by mouth 2 (two) times daily. For 7 days.     Epoetin  Alfa-epbx (RETACRIT  IJ) Epoetin  alfa - epbx (Retacrit )     escitalopram (LEXAPRO) 10 MG tablet Take 10 mg by mouth daily.     furosemide  (LASIX ) 80 MG tablet Take 80 mg by mouth 2 (two) times daily.     gabapentin  (NEURONTIN ) 600 MG tablet Take 600 mg by mouth at bedtime as needed (pt. states she takes 2 600mg  tables at bedtime.).     gabapentin  (NEURONTIN ) 600 MG tablet Take 600 mg by mouth daily. Pt. States she takes 1 tablet during the AM.     hydrOXYzine  (ATARAX ) 25 MG tablet Take 25 mg by mouth at bedtime as needed for anxiety (pt. states she takes two 25mg  tablets at bedtime for insomnia and anxiety.).     oxyCODONE -acetaminophen  (PERCOCET) 5-325 MG tablet Take 1 tablet by mouth every 6 (six) hours as needed for severe pain (pain score 7-10) or moderate pain (pain score 4-6). 12 tablet 0   oxyCODONE -acetaminophen  (PERCOCET) 5-325 MG tablet Take 1 tablet by mouth every 4 (four) hours as needed for severe pain (pain score 7-10) or moderate pain (pain score 4-6). 30 tablet 0   pantoprazole  (PROTONIX ) 20 MG tablet Take 20 mg by mouth as needed.     promethazine  (PHENERGAN ) 25 MG tablet Take 1 tablet (25 mg total) by mouth every 6 (six) hours as needed for nausea or vomiting. 30 tablet 0   No current facility-administered medications on file prior to visit.    There are no Patient Instructions on file for this visit. No follow-ups on file.   [name], NP

## 2024-03-25 ENCOUNTER — Telehealth (INDEPENDENT_AMBULATORY_CARE_PROVIDER_SITE_OTHER): Payer: Self-pay

## 2024-03-25 MED ORDER — OXYCODONE HCL 5 MG PO TABS: 5.0000 mg | ORAL_TABLET | Freq: Four times a day (QID) | ORAL | 0 refills | Status: AC | PRN

## 2024-03-25 NOTE — Telephone Encounter (Signed)
 [name] with [facility] left a message stating that the Oxycodone  5mg  prescription that was sent on 03/22/24 will need change from capsule to tablet. [name] will send a new prescription to [facility]. Patient has been notified

## 2024-04-01 ENCOUNTER — Telehealth (INDEPENDENT_AMBULATORY_CARE_PROVIDER_SITE_OTHER): Payer: Self-pay

## 2024-04-01 NOTE — Telephone Encounter (Signed)
 Patient left a message requesting referral for physical therapy to be sent to [facility] [place]. Please Advise

## 2024-04-08 ENCOUNTER — Other Ambulatory Visit (INDEPENDENT_AMBULATORY_CARE_PROVIDER_SITE_OTHER): Payer: Self-pay | Admitting: Nurse Practitioner

## 2024-04-09 ENCOUNTER — Ambulatory Visit (INDEPENDENT_AMBULATORY_CARE_PROVIDER_SITE_OTHER): Payer: MEDICAID | Admitting: Vascular Surgery

## 2024-04-09 ENCOUNTER — Encounter (INDEPENDENT_AMBULATORY_CARE_PROVIDER_SITE_OTHER): Payer: Self-pay | Admitting: Vascular Surgery

## 2024-04-09 VITALS — BP 161/74 | HR 80 | Resp 18 | Ht 69.0 in | Wt 126.0 lb

## 2024-04-09 DIAGNOSIS — Z4889 Encounter for other specified surgical aftercare: Secondary | ICD-10-CM

## 2024-04-09 DIAGNOSIS — E1169 Type 2 diabetes mellitus with other specified complication: Secondary | ICD-10-CM

## 2024-04-09 DIAGNOSIS — I10 Essential (primary) hypertension: Secondary | ICD-10-CM

## 2024-04-09 DIAGNOSIS — E785 Hyperlipidemia, unspecified: Secondary | ICD-10-CM

## 2024-04-09 NOTE — Progress Notes (Signed)
 Subjective:    Patient ID: [name], female    DOB: 12/23/1975, 48 y.o.   MRN: [id number] Chief Complaint  Patient presents with   Follow-up    3 week follow up no studies staple removal L BKA    [name] is a 48 yo female who presents to clinic today for her 3-week postoperative wound check with staple removal from the left BKA.  Patient endorses she is feeling well these days.  She continues to go to hemodialysis as scheduled.  She denies any pain or difficulties from her left BKA.  Staples are clean dry and intact removed today.    Review of Systems  Constitutional: Negative.   Musculoskeletal:        History of left BKA  Skin:  Positive for wound.  All other systems reviewed and are negative.      Objective:   Physical Exam Constitutional:      Appearance: Normal appearance. She is normal weight.  HENT:     Head: Normocephalic.   Eyes:     Pupils: Pupils are equal, round, and reactive to light.    Cardiovascular:     Rate and Rhythm: Normal rate and regular rhythm.     Pulses: Normal pulses.     Heart sounds: Normal heart sounds.  Pulmonary:     Effort: Pulmonary effort is normal.     Breath sounds: Normal breath sounds.  Abdominal:     General: Abdomen is flat.     Palpations: Abdomen is soft.   Musculoskeletal:        General: Deformity present.     Comments: History of left BKA   Skin:    General: Skin is warm and dry.     Capillary Refill: Capillary refill takes 2 to 3 seconds.   Neurological:     General: No focal deficit present.     Mental Status: She is alert and oriented to person, place, and time. Mental status is at baseline.   Psychiatric:        Mood and Affect: Mood normal.        Behavior: Behavior normal.        Thought Content: Thought content normal.        Judgment: Judgment normal.     LMP  (LMP Unknown)   Past Medical History:  Diagnosis Date   Anemia    Anxiety    Bipolar disorder (HCC)    Cervical cancer  (HCC)    Chronic combined systolic and diastolic CHF (congestive heart failure) (HCC)    a. ECHO 04/23/14 LV EF: 45% - 50%,  mild LVH, diffuse hypokinesis. LV diastolic dysfunction, high ventricular filling pressure. Mild LA dilation.   Cocaine abuse (HCC)    Complication of anesthesia    a.) delayed/prolonged emergence   COPD (chronic obstructive pulmonary disease) (HCC)    Deep vein thrombosis (DVT) of popliteal vein of left lower extremity (HCC)    Depression    Diabetes mellitus without complication (HCC)    Diarrhea    Dyspnea    ESRD on hemodialysis (HCC)    GERD (gastroesophageal reflux disease)    Heroin overdose (HCC) 10/2018   History of kidney stones    Hypertension    Intractable nausea and vomiting 02/18/2024   Mitral stenosis    MRSA infection    arm wound   Nicotine  dependence with current use    Pneumonia    Seizures (HCC)    last seizure  March 2021; usual 1-2 per month   Stroke [facility])    mini stroke when on drugs    Social History   Socioeconomic History   Marital status: Single    Spouse name: Not on file   Number of children: 0   Years of education: 12   Highest education level: Not on file  Occupational History   Occupation: Unemployed  Tobacco Use   Smoking status: Every Day    Current packs/day: 1.00    Average packs/day: 1 pack/day for 28.0 years (28.0 ttl pk-yrs)    Types: Cigarettes   Smokeless tobacco: Never  Vaping Use   Vaping status: Never Used  Substance and Sexual Activity   Alcohol use: Not Currently    Alcohol/week: 0.0 standard drinks of alcohol    Comment: Quit drinking 11/16 per pt   Drug use: Yes    Types: Marijuana    Comment: daily   Sexual activity: Yes  Other Topics Concern   Not on file  Social History Narrative   Lives at home with mother.    Caffeine use: Soda (drinks 3L per day)   Social Drivers of Corporate investment banker Strain: Not on file  Food Insecurity: No Food Insecurity (03/13/2024)   Hunger Vital  Sign    Worried About Running Out of Food in the Last Year: Never true    Ran Out of Food in the Last Year: Never true  Transportation Needs: No Transportation Needs (03/13/2024)   PRAPARE - Administrator, Civil Service (Medical): No    Lack of Transportation (Non-Medical): No  Physical Activity: Not on file  Stress: Not on file  Social Connections: Not on file  Intimate Partner Violence: Not At Risk (03/13/2024)   Humiliation, Afraid, Rape, and Kick questionnaire    Fear of Current or Ex-Partner: No    Emotionally Abused: No    Physically Abused: No    Sexually Abused: No    Past Surgical History:  Procedure Laterality Date   ABDOMINAL HYSTERECTOMY     ACHILLES TENDON SURGERY Left 03/31/2020   Procedure: ACHILLES LENGTHENING;  Surgeon: Pink Bridges, DPM;  Location: [facility] ORS;  Service: Podiatry;  Laterality: Left;   AMPUTATION Right 10/04/2020   Procedure: PARTIAL AMPUTATION RAY 4th and 5th;  Surgeon: Pink Bridges, DPM;  Location: [facility];  Service: Podiatry;  Laterality: Right;   AMPUTATION Left 03/13/2024   Procedure: AMPUTATION BELOW KNEE;  Surgeon: Jackquelyn Mass, MD;  Location: [facility];  Service: Vascular;  Laterality: Left;   AMPUTATION TOE Left 03/23/2019   Procedure: AMPUTATION TOE GREAT LEFT TOE;  Surgeon: Sharlyn Deaner, DPM;  Location: [facility];  Service: Podiatry;  Laterality: Left;   AMPUTATION TOE Left 08/16/2019   Procedure: AMPUTATION TOE 5th;  Surgeon: Pink Bridges, DPM;  Location: [facility];  Service: Podiatry;  Laterality: Left;   AMPUTATION TOE Left 02/05/2020   Procedure: AMPUTATION MPJ LEFT 4TH;  Surgeon: Pink Bridges, DPM;  Location: [facility];  Service: Podiatry;  Laterality: Left;   ANKLE FRACTURE SURGERY Left    pins/screws in place   AV FISTULA PLACEMENT  2025   BUBBLE STUDY  01/08/2021   Procedure: BUBBLE STUDY;  Surgeon: Lenise Quince, MD;  Location: [facility] ENDOSCOPY;  Service: Cardiovascular;;   CHOLECYSTECTOMY  2005   FRACTURE  SURGERY     IRRIGATION AND DEBRIDEMENT FOOT Left 10/04/2020   Procedure: IRRIGATION AND DEBRIDEMENT FOOT;  Surgeon: Pink Bridges, DPM;  Location: [facility];  Service:  Podiatry;  Laterality: Left;   LOWER EXTREMITY ANGIOGRAPHY Left 03/12/2019   Procedure: LOWER EXTREMITY ANGIOGRAPHY;  Surgeon: Jackquelyn Mass, MD;  Location: [facility] INVASIVE CV LAB;  Service: Cardiovascular;  Laterality: Left;   LOWER EXTREMITY ANGIOGRAPHY Right 10/06/2020   Procedure: Lower Extremity Angiography;  Surgeon: Jackquelyn Mass, MD;  Location: [facility] INVASIVE CV LAB;  Service: Cardiovascular;  Laterality: Right;   TEE WITHOUT CARDIOVERSION N/A 03/26/2019   Procedure: TRANSESOPHAGEAL ECHOCARDIOGRAM (TEE);  Surgeon: Sammy Crisp, MD;  Location: [facility];  Service: Cardiovascular;  Laterality: N/A;   TEE WITHOUT CARDIOVERSION N/A 01/08/2021   Procedure: TRANSESOPHAGEAL ECHOCARDIOGRAM (TEE);  Surgeon: Lenise Quince, MD;  [facility]COPY;  Service: Cardiovascular;  Laterality: N/A;   TRANSMETATARSAL AMPUTATION Left 03/31/2020   Procedure: TRANSMETATARSAL AMPUTATION;  Surgeon: Pink Bridges, DPM;  Location: [facility];  Service: Podiatry;  Laterality: Left;   TUBAL LIGATION      Family History  Problem Relation Age of Onset   Hypertension Other    Drug abuse Mother    Seizures Neg Hx     Allergies  Allergen Reactions   Albumin (Human)     unknown   Erythromycin Rash   Midecamycin Rash       Latest Ref Rng & Units 03/16/2024    3:51 PM 03/16/2024    5:26 AM 03/15/2024    5:30 AM  CBC  WBC 4.0 - 10.5 K/uL 7.7  6.8  7.5   Hemoglobin 12.0 - 15.0 g/dL 9.2  7.4  7.9   Hematocrit 36.0 - 46.0 % 28.6  23.0  23.9   Platelets 150 - 400 K/uL 364  290  293       CMP     Component Value Date/Time   NA 133 (L) 03/16/2024 1551   K 3.6 03/16/2024 1551   CL 95 (L) 03/16/2024 1551   CO2 25 03/16/2024 1551   GLUCOSE 103 (H) 03/16/2024 1551   BUN 40 (H) 03/16/2024 1551   CREATININE 3.70 (H) 03/16/2024  1551   CREATININE 1.54 (H) 02/26/2019 1139   CALCIUM  8.6 (L) 03/16/2024 1551   PROT 7.9 02/18/2024 0845   ALBUMIN 3.3 (L) 03/16/2024 1551   AST 24 02/18/2024 0845   ALT 19 02/18/2024 0845   ALKPHOS 110 02/18/2024 0845   BILITOT 0.6 02/18/2024 0845   GFRNONAA 14 (L) 03/16/2024 1551     No results found.     Assessment & Plan:   1. Encounter for post surgical wound check (Primary) Patient returns today for 3-week follow-up for wound check and staple removal.  Staples were all removed without difficulty.  No bleeding.  No hematoma seroma or infection to note.  Incision line is well-healed.  Had a long discussion with the patient about keeping the Ace bandage in place to help control swelling to prepare for prosthetic.  She verbalized understanding today.  We put an Ace bandage on in clinic today.  Patient to follow-up in 2 months for wound check to be cleared for [facility].  2. Essential hypertension Continue antihypertensive medications as already ordered, these medications have been reviewed and there are no changes at this time.  3. Hyperlipidemia associated with type 2 diabetes mellitus (HCC) Continue statin as ordered and reviewed, no changes at this time   Current Outpatient Medications on File Prior to Visit  Medication Sig Dispense Refill   acetaminophen  (TYLENOL ) 325 MG tablet Take 2 tablets (650 mg total) by mouth every 6 (six) hours as needed for mild  pain (or Fever >/= 101). 30 tablet 0   amoxicillin -clavulanate (AUGMENTIN ) 500-125 MG tablet Take 1 tablet by mouth. Unknown frequency.     apixaban  (ELIQUIS ) 5 MG TABS tablet Take 5 mg by mouth 2 (two) times daily.     aspirin  EC 81 MG tablet Take 81 mg by mouth daily. Swallow whole.     atorvastatin  (LIPITOR) 40 MG tablet Take 40 mg by mouth daily.     azelastine (ASTELIN) 0.1 % nasal spray Place 2 sprays into both nostrils 2 (two) times daily. Use 1-2 sprays in each nostril as directed     doxycycline  (VIBRAMYCIN )  100 MG capsule Take 100 mg by mouth 2 (two) times daily. For 7 days.     Epoetin  Alfa-epbx (RETACRIT  IJ) Epoetin  alfa - epbx (Retacrit )     escitalopram (LEXAPRO) 10 MG tablet Take 10 mg by mouth daily.     furosemide  (LASIX ) 80 MG tablet Take 80 mg by mouth 2 (two) times daily.     gabapentin  (NEURONTIN ) 300 MG capsule Take 1 capsule (300 mg total) by mouth 2 (two) times daily. 60 capsule 3   gabapentin  (NEURONTIN ) 600 MG tablet Take 600 mg by mouth at bedtime as needed (pt. states she takes 2 600mg  tables at bedtime.).     gabapentin  (NEURONTIN ) 600 MG tablet Take 600 mg by mouth daily. Pt. States she takes 1 tablet during the AM.     hydrOXYzine  (ATARAX ) 25 MG tablet Take 25 mg by mouth at bedtime as needed for anxiety (pt. states she takes two 25mg  tablets at bedtime for insomnia and anxiety.).     oxyCODONE  (OXY IR/ROXICODONE ) 5 MG immediate release tablet Take 1 tablet (5 mg total) by mouth every 6 (six) hours as needed for severe pain (pain score 7-10). 30 tablet 0   oxyCODONE -acetaminophen  (PERCOCET) 5-325 MG tablet Take 1 tablet by mouth every 6 (six) hours as needed for severe pain (pain score 7-10) or moderate pain (pain score 4-6). 12 tablet 0   oxyCODONE -acetaminophen  (PERCOCET) 5-325 MG tablet Take 1 tablet by mouth every 4 (four) hours as needed for severe pain (pain score 7-10) or moderate pain (pain score 4-6). 30 tablet 0   pantoprazole  (PROTONIX ) 20 MG tablet Take 20 mg by mouth as needed.     promethazine  (PHENERGAN ) 25 MG tablet Take 1 tablet (25 mg total) by mouth every 6 (six) hours as needed for nausea or vomiting. 30 tablet 0   No current facility-administered medications on file prior to visit.    There are no Patient Instructions on file for this visit. No follow-ups on file.   [name], NP

## 2024-04-15 ENCOUNTER — Ambulatory Visit: Payer: MEDICAID | Admitting: Physician Assistant

## 2024-04-22 ENCOUNTER — Ambulatory Visit: Payer: MEDICAID | Admitting: Physician Assistant

## 2024-06-10 ENCOUNTER — Encounter (INDEPENDENT_AMBULATORY_CARE_PROVIDER_SITE_OTHER): Payer: Self-pay | Admitting: Vascular Surgery

## 2024-06-10 ENCOUNTER — Ambulatory Visit (INDEPENDENT_AMBULATORY_CARE_PROVIDER_SITE_OTHER): Payer: MEDICAID | Admitting: Vascular Surgery

## 2024-06-10 VITALS — BP 98/52 | HR 65

## 2024-06-10 DIAGNOSIS — I10 Essential (primary) hypertension: Secondary | ICD-10-CM

## 2024-06-10 DIAGNOSIS — E785 Hyperlipidemia, unspecified: Secondary | ICD-10-CM

## 2024-06-10 DIAGNOSIS — E1169 Type 2 diabetes mellitus with other specified complication: Secondary | ICD-10-CM

## 2024-06-10 DIAGNOSIS — Z4889 Encounter for other specified surgical aftercare: Secondary | ICD-10-CM

## 2024-06-10 NOTE — Progress Notes (Addendum)
 Subjective:    Patient ID: [name], female    DOB: Sep 05, 1976, 48 y.o.   MRN: [id number] Chief Complaint  Patient presents with   Follow-up     fu 2 months no studies wound check see BP     [name] is a 48 yo female who presents to clinic today for her 3 month postoperative wound check of left BKA.  Patient endorses she is feeling well these days.  She continues to go to hemodialysis as scheduled.  She denies any pain or difficulties from her left BKA. Requests referral to [facility] today to get a prosthetic.     Review of Systems  Constitutional: Negative.   Musculoskeletal:  Positive for gait problem.       Post left BKA  Skin:  Positive for wound.       Plantar wound to right foot healing well  All other systems reviewed and are negative.      Objective:   Physical Exam Vitals reviewed.  Constitutional:      Appearance: Normal appearance. She is normal weight.  HENT:     Head: Normocephalic.  Eyes:     Pupils: Pupils are equal, round, and reactive to light.  Cardiovascular:     Rate and Rhythm: Normal rate and regular rhythm.     Pulses: Normal pulses.     Heart sounds: Normal heart sounds.  Pulmonary:     Effort: Pulmonary effort is normal.     Breath sounds: Normal breath sounds.  Abdominal:     General: Abdomen is flat. Bowel sounds are normal.     Palpations: Abdomen is soft.  Musculoskeletal:        General: Deformity present.     Comments: Left BKA from 03/13/2024  Skin:    General: Skin is warm and dry.     Capillary Refill: Capillary refill takes 2 to 3 seconds.  Neurological:     General: No focal deficit present.     Mental Status: She is alert and oriented to person, place, and time. Mental status is at baseline.  Psychiatric:        Mood and Affect: Mood normal.        Behavior: Behavior normal.        Thought Content: Thought content normal.        Judgment: Judgment normal.     BP (!) 98/52   Pulse 65   LMP  (LMP  Unknown)   Past Medical History:  Diagnosis Date   Anemia    Anxiety    Bipolar disorder (HCC)    Cervical cancer (HCC)    Chronic combined systolic and diastolic CHF (congestive heart failure) (HCC)    a. ECHO 04/23/14 LV EF: 45% - 50%,  mild LVH, diffuse hypokinesis. LV diastolic dysfunction, high ventricular filling pressure. Mild LA dilation.   Cocaine abuse (HCC)    Complication of anesthesia    a.) delayed/prolonged emergence   COPD (chronic obstructive pulmonary disease) (HCC)    Deep vein thrombosis (DVT) of popliteal vein of left lower extremity (HCC)    Depression    Diabetes mellitus without complication (HCC)    Diarrhea    Dyspnea    ESRD on hemodialysis (HCC)    GERD (gastroesophageal reflux disease)    Heroin overdose (HCC) 10/2018   History of kidney stones    Hypertension    Intractable nausea and vomiting 02/18/2024   Mitral stenosis    MRSA infection  arm wound   Nicotine  dependence with current use    Pneumonia    Seizures (HCC)    last seizure March 2021; usual 1-2 per month   Stroke [facility])    mini stroke when on drugs    Social History   Socioeconomic History   Marital status: Single    Spouse name: Not on file   Number of children: 0   Years of education: 12   Highest education level: Not on file  Occupational History   Occupation: Unemployed  Tobacco Use   Smoking status: Every Day    Current packs/day: 1.00    Average packs/day: 1 pack/day for 28.0 years (28.0 ttl pk-yrs)    Types: Cigarettes   Smokeless tobacco: Never  Vaping Use   Vaping status: Never Used  Substance and Sexual Activity   Alcohol use: Not Currently    Alcohol/week: 0.0 standard drinks of alcohol    Comment: Quit drinking 11/16 per pt   Drug use: Yes    Types: Marijuana    Comment: daily   Sexual activity: Yes  Other Topics Concern   Not on file  Social History Narrative   Lives at home with mother.    Caffeine use: Soda (drinks 3L per day)   Social Drivers  of Corporate investment banker Strain: Not on file  Food Insecurity: No Food Insecurity (03/13/2024)   Hunger Vital Sign    Worried About Running Out of Food in the Last Year: Never true    Ran Out of Food in the Last Year: Never true  Transportation Needs: No Transportation Needs (03/13/2024)   PRAPARE - Administrator, Civil Service (Medical): No    Lack of Transportation (Non-Medical): No  Physical Activity: Not on file  Stress: Not on file  Social Connections: Not on file  Intimate Partner Violence: Not At Risk (03/13/2024)   Humiliation, Afraid, Rape, and Kick questionnaire    Fear of Current or Ex-Partner: No    Emotionally Abused: No    Physically Abused: No    Sexually Abused: No    Past Surgical History:  Procedure Laterality Date   ABDOMINAL HYSTERECTOMY     ACHILLES TENDON SURGERY Left 03/31/2020   Procedure: ACHILLES LENGTHENING;  Surgeon: Lennie Barter, DPM;  Location: [facility];  Service: Podiatry;  Laterality: Left;   AMPUTATION Right 10/04/2020   Procedure: PARTIAL AMPUTATION RAY 4th and 5th;  Surgeon: Lennie Barter, DPM;  Location: [facility];  Service: Podiatry;  Laterality: Right;   AMPUTATION Left 03/13/2024   Procedure: AMPUTATION BELOW KNEE;  Surgeon: Jama Cordella MATSU, MD;  Location: [facility];  Service: Vascular;  Laterality: Left;   AMPUTATION TOE Left 03/23/2019   Procedure: AMPUTATION TOE GREAT LEFT TOE;  Surgeon: Lilli Cough, DPM;  Location: [facility];  Service: Podiatry;  Laterality: Left;   AMPUTATION TOE Left 08/16/2019   Procedure: AMPUTATION TOE 5th;  Surgeon: Lennie Barter, DPM;  Location: [facility];  Service: Podiatry;  Laterality: Left;   AMPUTATION TOE Left 02/05/2020   Procedure: AMPUTATION MPJ LEFT 4TH;  Surgeon: Lennie Barter, DPM;  Location: [facility];  Service: Podiatry;  Laterality: Left;   ANKLE FRACTURE SURGERY Left    pins/screws in place   AV FISTULA PLACEMENT  2025   BUBBLE STUDY  01/08/2021   Procedure: BUBBLE STUDY;   Surgeon: Pietro Redell RAMAN, MD;  Location[facility] ENDOSCOPY;  Service: Cardiovascular;;   CHOLECYSTECTOMY  2005   FRACTURE SURGERY     IRRIGATION  AND DEBRIDEMENT FOOT Left 10/04/2020   Procedure: IRRIGATION AND DEBRIDEMENT FOOT;  Surgeon: Lennie Barter, DPM;  Location: [facility];  Service: Podiatry;  Laterality: Left;   LOWER EXTREMITY ANGIOGRAPHY Left 03/12/2019   Procedure: LOWER EXTREMITY ANGIOGRAPHY;  Surgeon: Jama Cordella MATSU, MD;  Location: [facility] INVASIVE CV LAB;  Service: Cardiovascular;  Laterality: Left;   LOWER EXTREMITY ANGIOGRAPHY Right 10/06/2020   Procedure: Lower Extremity Angiography;  Surgeon: Jama Cordella MATSU, MD;  Location: [facility] INVAS[facility] CV LAB;  Service: Cardiovascular;  Laterality: Right;   TEE WITHOUT CARDIOVERSION N/A 03/26/2019   Procedure: TRANSESOPHAGEAL ECHOCARDIOGRAM (TEE);  Surgeon: Mady Bruckner, MD;  Location: [facility];  Service: Cardiovascular;  Laterality: N/A;   TEE WITHOUT CARDIOVERSION N/A 01/08/2021   Procedure: TRANSESOPHAGEAL ECHOCARDIOGRAM (TEE);  Surgeon: Pietro Redell RAMAN, MD;  Location: [facility] ENDOSCOPY;  Service: Cardiovascular;  Laterality: N/A;   TRANSMETATARSAL AMPUTATION Left 03/31/2020   Procedure: TRANSMETATARSAL AMPUTATION;  Surgeon: Lennie Barter, DPM;  Location: [facility];  Service: Podiatry;  Laterality: Left;   TUBAL LIGATION      Family History  Problem Relation Age of Onset   Hypertension Other    Drug abuse Mother    Seizures Neg Hx     Allergies  Allergen Reactions   Albumin (Human)     unknown   Erythromycin Rash   Midecamycin Rash       Latest Ref Rng & Units 03/16/2024    3:51 PM 03/16/2024    5:26 AM 03/15/2024    5:30 AM  CBC  WBC 4.0 - 10.5 K/uL 7.7  6.8  7.5   Hemoglobin 12.0 - 15.0 g/dL 9.2  7.4  7.9   Hematocrit 36.0 - 46.0 % 28.6  23.0  23.9   Platelets 150 - 400 K/uL 364  290  293       CMP     Component Value Date/Time   NA 133 (L) 03/16/2024 1551   K 3.6 03/16/2024 1551   CL 95 (L) 03/16/2024 1551    CO2 25 03/16/2024 1551   GLUCOSE 103 (H) 03/16/2024 1551   BUN 40 (H) 03/16/2024 1551   CREATININE 3.70 (H) 03/16/2024 1551   CREATININE 1.54 (H) 02/26/2019 1139   CALCIUM  8.6 (L) 03/16/2024 1551   PROT 7.9 02/18/2024 0845   ALBUMIN 3.3 (L) 03/16/2024 1551   AST 24 02/18/2024 0845   ALT 19 02/18/2024 0845   ALKPHOS 110 02/18/2024 0845   BILITOT 0.6 02/18/2024 0845   GFRNONAA 14 (L) 03/16/2024 1551     No results found.     Assessment & Plan:   1. Encounter for post surgical wound check (Primary) Patient follows up in clinic today for postop wound check.  Patient's left BKA is completely healed.  No wound dehiscence and no signs or symptoms of infection hematoma or seroma.  [facility] referral placed today. Patient has expressed the desire to ambulate with a prosthetic. Patient is currently limited by her amputation and would be able to complete ADL's and return to work and walk with a prosthetic. She is currently at K2 level. No comorbidities at this time that would limit her from using a prosthetic.     2. Essential hypertension Continue antihypertensive medications as already ordered, these medications have been reviewed and there are no changes at this time.  3. Hyperlipidemia associated with type 2 diabetes mellitus (HCC) Continue statin as ordered and reviewed, no changes at this time   Current Outpatient Medications on File Prior to Visit  Medication  Sig Dispense Refill   acetaminophen  (TYLENOL ) 325 MG tablet Take 2 tablets (650 mg total) by mouth every 6 (six) hours as needed for mild pain (or Fever >/= 101). 30 tablet 0   apixaban  (ELIQUIS ) 5 MG TABS tablet Take 5 mg by mouth 2 (two) times daily.     aspirin  EC 81 MG tablet Take 81 mg by mouth daily. Swallow whole.     atorvastatin  (LIPITOR) 40 MG tablet Take 40 mg by mouth daily.     azelastine (ASTELIN) 0.1 % nasal spray Place 2 sprays into both nostrils 2 (two) times daily. Use 1-2 sprays in each nostril as  directed     Epoetin  Alfa-epbx (RETACRIT  IJ) Epoetin  alfa - epbx (Retacrit )     escitalopram (LEXAPRO) 10 MG tablet Take 10 mg by mouth daily.     furosemide  (LASIX ) 80 MG tablet Take 80 mg by mouth 2 (two) times daily.     gabapentin  (NEURONTIN ) 600 MG tablet Take 600 mg by mouth at bedtime as needed (pt. states she takes 2 600mg  tables at bedtime.).     gabapentin  (NEURONTIN ) 600 MG tablet Take 600 mg by mouth daily. Pt. States she takes 1 tablet during the AM.     hydrOXYzine  (ATARAX ) 25 MG tablet Take 25 mg by mouth at bedtime as needed for anxiety (pt. states she takes two 25mg  tablets at bedtime for insomnia and anxiety.).     oxyCODONE  (OXY IR/ROXICODONE ) 5 MG immediate release tablet Take 1 tablet (5 mg total) by mouth every 6 (six) hours as needed for severe pain (pain score 7-10). 30 tablet 0   oxyCODONE -acetaminophen  (PERCOCET) 5-325 MG tablet Take 1 tablet by mouth every 6 (six) hours as needed for severe pain (pain score 7-10) or moderate pain (pain score 4-6). 12 tablet 0   oxyCODONE -acetaminophen  (PERCOCET) 5-325 MG tablet Take 1 tablet by mouth every 4 (four) hours as needed for severe pain (pain score 7-10) or moderate pain (pain score 4-6). 30 tablet 0   pantoprazole  (PROTONIX ) 20 MG tablet Take 20 mg by mouth as needed.     promethazine  (PHENERGAN ) 25 MG tablet Take 1 tablet (25 mg total) by mouth every 6 (six) hours as needed for nausea or vomiting. 30 tablet 0   amoxicillin -clavulanate (AUGMENTIN ) 500-125 MG tablet Take 1 tablet by mouth. Unknown frequency. (Patient not taking: Reported on 06/10/2024)     doxycycline  (VIBRAMYCIN ) 100 MG capsule Take 100 mg by mouth 2 (two) times daily. For 7 days. (Patient not taking: Reported on 06/10/2024)     gabapentin  (NEURONTIN ) 300 MG capsule Take 1 capsule (300 mg total) by mouth 2 (two) times daily. 60 capsule 3   No current facility-administered medications on file prior to visit.    There are no Patient Instructions on file for this  visit. No follow-ups on file.   Gwendlyn JONELLE Shank, NP

## 2024-07-12 ENCOUNTER — Emergency Department (HOSPITAL_COMMUNITY)
Admission: EM | Admit: 2024-07-12 | Discharge: 2024-07-12 | Discharge status: Against Medical Advice | Payer: MEDICAID | Attending: Emergency Medicine | Admitting: Emergency Medicine

## 2024-07-12 ENCOUNTER — Emergency Department (HOSPITAL_COMMUNITY): Payer: MEDICAID

## 2024-07-12 ENCOUNTER — Other Ambulatory Visit: Payer: Self-pay

## 2024-07-12 ENCOUNTER — Encounter (HOSPITAL_COMMUNITY): Payer: Self-pay | Admitting: Emergency Medicine

## 2024-07-12 DIAGNOSIS — R112 Nausea with vomiting, unspecified: Secondary | ICD-10-CM | POA: Diagnosis present

## 2024-07-12 DIAGNOSIS — Z5329 Procedure and treatment not carried out because of patient's decision for other reasons: Secondary | ICD-10-CM | POA: Insufficient documentation

## 2024-07-12 DIAGNOSIS — R1013 Epigastric pain: Secondary | ICD-10-CM | POA: Diagnosis not present

## 2024-07-12 LAB — COMPREHENSIVE METABOLIC PANEL WITH GFR
ALT: 318 U/L — ABNORMAL HIGH (ref 0–44)
AST: 189 U/L — ABNORMAL HIGH (ref 15–41)
Albumin: 4.4 g/dL (ref 3.5–5.0)
Alkaline Phosphatase: 160 U/L — ABNORMAL HIGH (ref 38–126)
Anion gap: 16 — ABNORMAL HIGH (ref 5–15)
BUN: 35 mg/dL — ABNORMAL HIGH (ref 6–20)
CO2: 26 mmol/L (ref 22–32)
Calcium: 10 mg/dL (ref 8.9–10.3)
Chloride: 95 mmol/L — ABNORMAL LOW (ref 98–111)
Creatinine, Ser: 3.79 mg/dL — ABNORMAL HIGH (ref 0.44–1.00)
GFR, Estimated: 14 mL/min — ABNORMAL LOW (ref >60)
Glucose, Bld: 148 mg/dL — ABNORMAL HIGH (ref 70–99)
Potassium: 3.4 mmol/L — ABNORMAL LOW (ref 3.5–5.1)
Sodium: 137 mmol/L (ref 135–145)
Total Bilirubin: 0.8 mg/dL (ref 0.0–1.2)
Total Protein: 8.5 g/dL — ABNORMAL HIGH (ref 6.5–8.1)

## 2024-07-12 LAB — CBC
HCT: 38 % (ref 36.0–46.0)
Hemoglobin: 13 g/dL (ref 12.0–15.0)
MCH: 31.3 pg (ref 26.0–34.0)
MCHC: 34.2 g/dL (ref 30.0–36.0)
MCV: 91.3 fL (ref 80.0–100.0)
Platelets: 268 K/uL (ref 150–400)
RBC: 4.16 MIL/uL (ref 3.87–5.11)
RDW: 13.8 % (ref 11.5–15.5)
WBC: 6.3 K/uL (ref 4.0–10.5)
nRBC: 0 % (ref 0.0–0.2)

## 2024-07-12 LAB — LIPASE, BLOOD: Lipase: 34 U/L (ref 11–51)

## 2024-07-12 MED ORDER — ONDANSETRON HCL 4 MG/2ML IJ SOLN: 4.0000 mg | Freq: Once | INTRAMUSCULAR | Status: DC | PRN

## 2024-07-12 MED ORDER — ONDANSETRON HCL 4 MG/2ML IJ SOLN
4.0000 mg | Freq: Once | INTRAMUSCULAR | Status: AC
  Administered 2024-07-12: 4 mg via INTRAVENOUS
  Filled 2024-07-12: qty 2

## 2024-07-12 MED ORDER — ONDANSETRON 4 MG PO TBDP: 4.0000 mg | ORAL_TABLET | Freq: Once | ORAL | Status: DC

## 2024-07-12 NOTE — ED Provider Triage Note (Signed)
 Emergency Medicine Provider Triage Evaluation Note  ARMONEE BOJANOWSKI , a 48 y.o. female  was evaluated in triage.  Pt complains of abdominal pain and vomiting.  Patient is dialysis patient and has been vomiting bilious and coffee-ground emesis.  Also has abdominal pain as well.  Review of Systems  Positive: Vomiting and abdominal pain Negative: Fever  Physical Exam  BP (!) 147/84   Pulse 72   Temp 99.3 F (37.4 C) (Oral)   Resp 20   Ht 5' 9 (1.753 m)   Wt 56.7 kg   LMP  (LMP Unknown)   SpO2 98%   BMI 18.46 kg/m  Gen:   Awake, slightly uncomfortable Resp:  Normal effort  MSK:   Moves extremities without difficulty  Other:  Mild epigastric tenderness  Medical Decision Making  Medically screening exam initiated at 9:00 PM.  Appropriate orders placed.  Joleena A Pitter was informed that the remainder of the evaluation will be completed by another provider, this initial triage assessment does not replace that evaluation, and the importance of remaining in the ED until their evaluation is complete.  CHYLER CREELY is a 48 y.o. female here presenting with abdominal pain and vomiting.  History of gastroparesis.  Ordered labs and CT abdomen pelvis.  Vitals are stable and she can wait in the waiting room pending workup    Patt Alm Macho, MD 07/12/24 2102

## 2024-07-12 NOTE — ED Triage Notes (Signed)
 BIB [facility] from home. Pt is dialysis and usually gets dialisys Tue, Thur, Sat. Pt was given Imodium at dialysis and is having Nausea and vomiting since admin. Pt states that she is having bile emesis that has black chunks in it.   BP 130/100 HR 68 Spo2 95 RA

## 2024-07-12 NOTE — ED Notes (Addendum)
 Pt came to registration desk stating she wants to leave, advised of risks of leaving, encouraged to stay for further eval. IV removed. States she wants to go home and visualized leaving with friend

## 2024-07-12 NOTE — ED Notes (Signed)
Rn attempted to obtain IV.

## 2024-08-09 ENCOUNTER — Telehealth (INDEPENDENT_AMBULATORY_CARE_PROVIDER_SITE_OTHER): Payer: Self-pay

## 2024-08-09 NOTE — Telephone Encounter (Signed)
 [name] from [facility] called in on behalf of patient wanting to request PT orders at [facility] for this patient as they Economist) have completed her prosthesis. Please advise

## 2024-08-15 NOTE — Telephone Encounter (Signed)
 Is this referring to [facility] in [place]? If so, do they know which location? Or do they mean the hospital location
